# Patient Record
Sex: Male | Born: 1960 | Race: Black or African American | Hispanic: No | Marital: Single
Health system: Southern US, Community
[De-identification: ages and names within clinical notes are randomized; demographics above are authoritative.]

## PROBLEM LIST (undated history)

## (undated) DIAGNOSIS — F32A Depression, unspecified: Secondary | ICD-10-CM

## (undated) DIAGNOSIS — N183 Chronic kidney disease, stage 3 unspecified: Secondary | ICD-10-CM

## (undated) DIAGNOSIS — K921 Melena: Secondary | ICD-10-CM

## (undated) DIAGNOSIS — F1490 Cocaine use, unspecified, uncomplicated: Secondary | ICD-10-CM

## (undated) DIAGNOSIS — I5032 Chronic diastolic (congestive) heart failure: Secondary | ICD-10-CM

## (undated) DIAGNOSIS — Z720 Tobacco use: Secondary | ICD-10-CM

## (undated) DIAGNOSIS — F1190 Opioid use, unspecified, uncomplicated: Secondary | ICD-10-CM

## (undated) DIAGNOSIS — I10 Essential (primary) hypertension: Secondary | ICD-10-CM

## (undated) DIAGNOSIS — I5042 Chronic combined systolic (congestive) and diastolic (congestive) heart failure: Secondary | ICD-10-CM

## (undated) DIAGNOSIS — Z87828 Personal history of other (healed) physical injury and trauma: Secondary | ICD-10-CM

## (undated) DIAGNOSIS — I509 Heart failure, unspecified: Secondary | ICD-10-CM

## (undated) DIAGNOSIS — K219 Gastro-esophageal reflux disease without esophagitis: Secondary | ICD-10-CM

## (undated) DIAGNOSIS — I4891 Unspecified atrial fibrillation: Secondary | ICD-10-CM

## (undated) DIAGNOSIS — I4892 Unspecified atrial flutter: Secondary | ICD-10-CM

## (undated) DIAGNOSIS — Z9119 Patient's noncompliance with other medical treatment and regimen: Secondary | ICD-10-CM

## (undated) DIAGNOSIS — E119 Type 2 diabetes mellitus without complications: Secondary | ICD-10-CM

## (undated) DIAGNOSIS — E7800 Pure hypercholesterolemia, unspecified: Secondary | ICD-10-CM

## (undated) DIAGNOSIS — K7460 Unspecified cirrhosis of liver: Secondary | ICD-10-CM

## (undated) DIAGNOSIS — F1911 Other psychoactive substance abuse, in remission: Secondary | ICD-10-CM

## (undated) DIAGNOSIS — G8929 Other chronic pain: Secondary | ICD-10-CM

## (undated) DIAGNOSIS — R079 Chest pain, unspecified: Secondary | ICD-10-CM

## (undated) DIAGNOSIS — Z91199 Patient's noncompliance with other medical treatment and regimen due to unspecified reason: Secondary | ICD-10-CM

## (undated) DIAGNOSIS — F329 Major depressive disorder, single episode, unspecified: Secondary | ICD-10-CM

## (undated) DIAGNOSIS — B1920 Unspecified viral hepatitis C without hepatic coma: Secondary | ICD-10-CM

## (undated) DIAGNOSIS — C801 Malignant (primary) neoplasm, unspecified: Secondary | ICD-10-CM

## (undated) DIAGNOSIS — M1990 Unspecified osteoarthritis, unspecified site: Secondary | ICD-10-CM

## (undated) DIAGNOSIS — I428 Other cardiomyopathies: Secondary | ICD-10-CM

## (undated) DIAGNOSIS — F1010 Alcohol abuse, uncomplicated: Secondary | ICD-10-CM

## (undated) DIAGNOSIS — G629 Polyneuropathy, unspecified: Secondary | ICD-10-CM

## (undated) HISTORY — DX: Gastro-esophageal reflux disease without esophagitis: K21.9

## (undated) HISTORY — PX: FRACTURE SURGERY: SHX138

## (undated) HISTORY — PX: KNEE ARTHROPLASTY: SHX992

## (undated) HISTORY — DX: Other psychoactive substance abuse, in remission: F19.11

## (undated) HISTORY — DX: Personal history of other (healed) physical injury and trauma: Z87.828

## (undated) HISTORY — DX: Tobacco use: Z72.0

## (undated) HISTORY — PX: THORACOTOMY: SUR1349

---

## 2002-05-12 ENCOUNTER — Emergency Department (HOSPITAL_COMMUNITY): Admission: EM | Admit: 2002-05-12 | Discharge: 2002-05-12 | Payer: Self-pay

## 2004-02-07 ENCOUNTER — Emergency Department (HOSPITAL_COMMUNITY): Admission: EM | Admit: 2004-02-07 | Discharge: 2004-02-07 | Payer: Self-pay | Admitting: Family Medicine

## 2004-04-15 ENCOUNTER — Emergency Department (HOSPITAL_COMMUNITY): Admission: EM | Admit: 2004-04-15 | Discharge: 2004-04-15 | Payer: Self-pay | Admitting: Family Medicine

## 2010-01-04 ENCOUNTER — Emergency Department (HOSPITAL_COMMUNITY): Admission: EM | Admit: 2010-01-04 | Discharge: 2010-01-04 | Payer: Self-pay | Admitting: Emergency Medicine

## 2010-06-15 ENCOUNTER — Inpatient Hospital Stay (HOSPITAL_COMMUNITY): Admission: EM | Admit: 2010-06-15 | Discharge: 2010-06-16 | Payer: Self-pay | Admitting: Emergency Medicine

## 2010-06-24 ENCOUNTER — Inpatient Hospital Stay (HOSPITAL_COMMUNITY): Admission: EM | Admit: 2010-06-24 | Discharge: 2010-06-26 | Payer: Self-pay | Admitting: Emergency Medicine

## 2011-03-09 LAB — RAPID URINE DRUG SCREEN, HOSP PERFORMED
Amphetamines: NOT DETECTED
Amphetamines: NOT DETECTED
Barbiturates: NOT DETECTED
Barbiturates: NOT DETECTED
Benzodiazepines: NOT DETECTED
Benzodiazepines: NOT DETECTED
Cocaine: NOT DETECTED
Cocaine: NOT DETECTED
Opiates: POSITIVE — AB
Opiates: POSITIVE — AB
Tetrahydrocannabinol: NOT DETECTED
Tetrahydrocannabinol: NOT DETECTED

## 2011-03-09 LAB — URINALYSIS, ROUTINE W REFLEX MICROSCOPIC
Bilirubin Urine: NEGATIVE
Bilirubin Urine: NEGATIVE
Glucose, UA: 100 mg/dL — AB
Glucose, UA: NEGATIVE mg/dL
Hgb urine dipstick: NEGATIVE
Hgb urine dipstick: NEGATIVE
Ketones, ur: NEGATIVE mg/dL
Ketones, ur: NEGATIVE mg/dL
Leukocytes, UA: NEGATIVE
Leukocytes, UA: NEGATIVE
Nitrite: NEGATIVE
Nitrite: NEGATIVE
Protein, ur: 100 mg/dL — AB
Protein, ur: 30 mg/dL — AB
Specific Gravity, Urine: 1.007 (ref 1.005–1.030)
Specific Gravity, Urine: 1.009 (ref 1.005–1.030)
Urobilinogen, UA: 0.2 mg/dL (ref 0.0–1.0)
Urobilinogen, UA: 0.2 mg/dL (ref 0.0–1.0)
pH: 5 (ref 5.0–8.0)
pH: 5 (ref 5.0–8.0)

## 2011-03-09 LAB — COMPREHENSIVE METABOLIC PANEL
ALT: 26 U/L (ref 0–53)
ALT: 30 U/L (ref 0–53)
AST: 31 U/L (ref 0–37)
AST: 51 U/L — ABNORMAL HIGH (ref 0–37)
Albumin: 3 g/dL — ABNORMAL LOW (ref 3.5–5.2)
Albumin: 3.5 g/dL (ref 3.5–5.2)
Alkaline Phosphatase: 74 U/L (ref 39–117)
Alkaline Phosphatase: 75 U/L (ref 39–117)
BUN: 5 mg/dL — ABNORMAL LOW (ref 6–23)
BUN: 8 mg/dL (ref 6–23)
CO2: 23 mEq/L (ref 19–32)
CO2: 28 mEq/L (ref 19–32)
Calcium: 8.3 mg/dL — ABNORMAL LOW (ref 8.4–10.5)
Calcium: 8.9 mg/dL (ref 8.4–10.5)
Chloride: 104 mEq/L (ref 96–112)
Chloride: 105 mEq/L (ref 96–112)
Creatinine, Ser: 1.05 mg/dL (ref 0.4–1.5)
Creatinine, Ser: 1.1 mg/dL (ref 0.4–1.5)
GFR calc Af Amer: >60 mL/min (ref >60)
GFR calc Af Amer: >60 mL/min (ref >60)
GFR calc non Af Amer: >60 mL/min (ref >60)
GFR calc non Af Amer: >60 mL/min (ref >60)
Glucose, Bld: 118 mg/dL — ABNORMAL HIGH (ref 70–99)
Glucose, Bld: 230 mg/dL — ABNORMAL HIGH (ref 70–99)
Potassium: 3.6 mEq/L (ref 3.5–5.1)
Potassium: 4.3 mEq/L (ref 3.5–5.1)
Sodium: 138 mEq/L (ref 135–145)
Sodium: 139 mEq/L (ref 135–145)
Total Bilirubin: 0.8 mg/dL (ref 0.3–1.2)
Total Bilirubin: 1.6 mg/dL — ABNORMAL HIGH (ref 0.3–1.2)
Total Protein: 6.4 g/dL (ref 6.0–8.3)
Total Protein: 7.8 g/dL (ref 6.0–8.3)

## 2011-03-09 LAB — DIFFERENTIAL
Basophils Absolute: 0 10*3/uL (ref 0.0–0.1)
Basophils Absolute: 0 10*3/uL (ref 0.0–0.1)
Basophils Absolute: 0.1 10*3/uL (ref 0.0–0.1)
Basophils Relative: 1 % (ref 0–1)
Basophils Relative: 1 % (ref 0–1)
Basophils Relative: 1 % (ref 0–1)
Eosinophils Absolute: 0 10*3/uL (ref 0.0–0.7)
Eosinophils Absolute: 0.1 10*3/uL (ref 0.0–0.7)
Eosinophils Absolute: 0.1 10*3/uL (ref 0.0–0.7)
Eosinophils Relative: 0 % (ref 0–5)
Eosinophils Relative: 1 % (ref 0–5)
Eosinophils Relative: 1 % (ref 0–5)
Lymphocytes Relative: 18 % (ref 12–46)
Lymphocytes Relative: 22 % (ref 12–46)
Lymphocytes Relative: 27 % (ref 12–46)
Lymphs Abs: 0.9 10*3/uL (ref 0.7–4.0)
Lymphs Abs: 1 10*3/uL (ref 0.7–4.0)
Lymphs Abs: 2 10*3/uL (ref 0.7–4.0)
Monocytes Absolute: 0.3 10*3/uL (ref 0.1–1.0)
Monocytes Absolute: 0.4 10*3/uL (ref 0.1–1.0)
Monocytes Absolute: 0.6 10*3/uL (ref 0.1–1.0)
Monocytes Relative: 7 % (ref 3–12)
Monocytes Relative: 8 % (ref 3–12)
Monocytes Relative: 8 % (ref 3–12)
Neutro Abs: 3 10*3/uL (ref 1.7–7.7)
Neutro Abs: 4.1 10*3/uL (ref 1.7–7.7)
Neutro Abs: 4.7 10*3/uL (ref 1.7–7.7)
Neutrophils Relative %: 63 % (ref 43–77)
Neutrophils Relative %: 70 % (ref 43–77)
Neutrophils Relative %: 73 % (ref 43–77)

## 2011-03-09 LAB — BASIC METABOLIC PANEL
BUN: 12 mg/dL (ref 6–23)
BUN: 15 mg/dL (ref 6–23)
BUN: 6 mg/dL (ref 6–23)
CO2: 16 mEq/L — ABNORMAL LOW (ref 19–32)
CO2: 29 mEq/L (ref 19–32)
CO2: 33 mEq/L — ABNORMAL HIGH (ref 19–32)
Calcium: 8.1 mg/dL — ABNORMAL LOW (ref 8.4–10.5)
Calcium: 8.6 mg/dL (ref 8.4–10.5)
Calcium: 9 mg/dL (ref 8.4–10.5)
Chloride: 104 mEq/L (ref 96–112)
Chloride: 91 mEq/L — ABNORMAL LOW (ref 96–112)
Chloride: 97 mEq/L (ref 96–112)
Creatinine, Ser: 1.08 mg/dL (ref 0.4–1.5)
Creatinine, Ser: 1.29 mg/dL (ref 0.4–1.5)
Creatinine, Ser: 1.42 mg/dL (ref 0.4–1.5)
GFR calc Af Amer: >60 mL/min (ref >60)
GFR calc Af Amer: >60 mL/min (ref >60)
GFR calc Af Amer: >60 mL/min (ref >60)
GFR calc non Af Amer: 53 mL/min — ABNORMAL LOW (ref >60)
GFR calc non Af Amer: 59 mL/min — ABNORMAL LOW (ref >60)
GFR calc non Af Amer: >60 mL/min (ref >60)
Glucose, Bld: 115 mg/dL — ABNORMAL HIGH (ref 70–99)
Glucose, Bld: 124 mg/dL — ABNORMAL HIGH (ref 70–99)
Glucose, Bld: 129 mg/dL — ABNORMAL HIGH (ref 70–99)
Potassium: 3.3 mEq/L — ABNORMAL LOW (ref 3.5–5.1)
Potassium: 3.4 mEq/L — ABNORMAL LOW (ref 3.5–5.1)
Potassium: 3.5 mEq/L (ref 3.5–5.1)
Sodium: 134 mEq/L — ABNORMAL LOW (ref 135–145)
Sodium: 136 mEq/L (ref 135–145)
Sodium: 136 mEq/L (ref 135–145)

## 2011-03-09 LAB — CBC
HCT: 37.7 % — ABNORMAL LOW (ref 39.0–52.0)
HCT: 40.6 % (ref 39.0–52.0)
HCT: 46.3 % (ref 39.0–52.0)
Hemoglobin: 13.2 g/dL (ref 13.0–17.0)
Hemoglobin: 14.1 g/dL (ref 13.0–17.0)
Hemoglobin: 15.9 g/dL (ref 13.0–17.0)
MCH: 34.4 pg — ABNORMAL HIGH (ref 26.0–34.0)
MCH: 34.4 pg — ABNORMAL HIGH (ref 26.0–34.0)
MCH: 34.7 pg — ABNORMAL HIGH (ref 26.0–34.0)
MCHC: 34.5 g/dL (ref 30.0–36.0)
MCHC: 34.7 g/dL (ref 30.0–36.0)
MCHC: 35 g/dL (ref 30.0–36.0)
MCV: 100 fL (ref 78.0–100.0)
MCV: 98.2 fL (ref 78.0–100.0)
MCV: 99.7 fL (ref 78.0–100.0)
Platelets: 169 10*3/uL (ref 150–400)
Platelets: 180 10*3/uL (ref 150–400)
Platelets: 232 10*3/uL (ref 150–400)
RBC: 3.83 MIL/uL — ABNORMAL LOW (ref 4.22–5.81)
RBC: 4.06 MIL/uL — ABNORMAL LOW (ref 4.22–5.81)
RBC: 4.64 MIL/uL (ref 4.22–5.81)
RDW: 13.9 % (ref 11.5–15.5)
RDW: 14 % (ref 11.5–15.5)
RDW: 14.1 % (ref 11.5–15.5)
WBC: 4.3 10*3/uL (ref 4.0–10.5)
WBC: 5.6 10*3/uL (ref 4.0–10.5)
WBC: 7.4 10*3/uL (ref 4.0–10.5)

## 2011-03-09 LAB — TSH: TSH: 1.319 u[IU]/mL (ref 0.350–4.500)

## 2011-03-09 LAB — POCT CARDIAC MARKERS
CKMB, poc: <1 ng/mL — ABNORMAL LOW (ref 1.0–8.0)
CKMB, poc: <1 ng/mL — ABNORMAL LOW (ref 1.0–8.0)
CKMB, poc: <1 ng/mL — ABNORMAL LOW (ref 1.0–8.0)
Myoglobin, poc: 40.9 ng/mL (ref 12–200)
Myoglobin, poc: 48.5 ng/mL (ref 12–200)
Myoglobin, poc: 62.1 ng/mL (ref 12–200)
Troponin i, poc: 0.05 ng/mL (ref 0.00–0.09)
Troponin i, poc: 0.07 ng/mL (ref 0.00–0.09)
Troponin i, poc: <0.05 ng/mL (ref 0.00–0.09)

## 2011-03-09 LAB — CK TOTAL AND CKMB (NOT AT ARMC)
CK, MB: 0.6 ng/mL (ref 0.3–4.0)
CK, MB: 0.6 ng/mL (ref 0.3–4.0)
CK, MB: 0.7 ng/mL (ref 0.3–4.0)
CK, MB: 0.7 ng/mL (ref 0.3–4.0)
CK, MB: 0.8 ng/mL (ref 0.3–4.0)
CK, MB: 1.5 ng/mL (ref 0.3–4.0)
Relative Index: INVALID (ref 0.0–2.5)
Relative Index: INVALID (ref 0.0–2.5)
Relative Index: INVALID (ref 0.0–2.5)
Relative Index: INVALID (ref 0.0–2.5)
Relative Index: INVALID (ref 0.0–2.5)
Relative Index: INVALID (ref 0.0–2.5)
Total CK: 22 U/L (ref 7–232)
Total CK: 33 U/L (ref 7–232)
Total CK: 37 U/L (ref 7–232)
Total CK: 39 U/L (ref 7–232)
Total CK: 42 U/L (ref 7–232)
Total CK: 7 U/L (ref 7–232)

## 2011-03-09 LAB — POCT I-STAT, CHEM 8
BUN: 11 mg/dL (ref 6–23)
Calcium, Ion: 1.04 mmol/L — ABNORMAL LOW (ref 1.12–1.32)
Chloride: 98 mEq/L (ref 96–112)
Creatinine, Ser: 1.2 mg/dL (ref 0.4–1.5)
Glucose, Bld: 165 mg/dL — ABNORMAL HIGH (ref 70–99)
HCT: 54 % — ABNORMAL HIGH (ref 39.0–52.0)
Hemoglobin: 18.4 g/dL — ABNORMAL HIGH (ref 13.0–17.0)
Potassium: 3.4 mEq/L — ABNORMAL LOW (ref 3.5–5.1)
Sodium: 136 mEq/L (ref 135–145)
TCO2: 32 mmol/L (ref 0–100)

## 2011-03-09 LAB — BLOOD GAS, ARTERIAL
Acid-base deficit: 7.3 mmol/L — ABNORMAL HIGH (ref 0.0–2.0)
Bicarbonate: 18.8 mEq/L — ABNORMAL LOW (ref 20.0–24.0)
Drawn by: 295031
Expiratory PAP: 6
FIO2: 1 %
Inspiratory PAP: 12
Mode: POSITIVE
O2 Saturation: 88.2 %
Patient temperature: 98.6
TCO2: 16.8 mmol/L (ref 0–100)
pCO2 arterial: 41.6 mmHg (ref 35.0–45.0)
pH, Arterial: 7.278 — ABNORMAL LOW (ref 7.350–7.450)
pO2, Arterial: 64.4 mmHg — ABNORMAL LOW (ref 80.0–100.0)

## 2011-03-09 LAB — T3: T3, Total: 66.3 ng/dl — ABNORMAL LOW (ref 80.0–204.0)

## 2011-03-09 LAB — URINE CULTURE
Colony Count: NO GROWTH
Colony Count: NO GROWTH
Culture: NO GROWTH
Culture: NO GROWTH

## 2011-03-09 LAB — URINE MICROSCOPIC-ADD ON

## 2011-03-09 LAB — LIPID PANEL
Cholesterol: 164 mg/dL (ref 0–200)
HDL: 68 mg/dL (ref >39)
LDL Cholesterol: 84 mg/dL (ref 0–99)
Total CHOL/HDL Ratio: 2.4 RATIO
Triglycerides: 59 mg/dL (ref <150)
VLDL: 12 mg/dL (ref 0–40)

## 2011-03-09 LAB — CARDIAC PANEL(CRET KIN+CKTOT+MB+TROPI)
CK, MB: 0.7 ng/mL (ref 0.3–4.0)
CK, MB: 0.7 ng/mL (ref 0.3–4.0)
Relative Index: INVALID (ref 0.0–2.5)
Relative Index: INVALID (ref 0.0–2.5)
Total CK: 42 U/L (ref 7–232)
Total CK: 46 U/L (ref 7–232)
Troponin I: 0.14 ng/mL — ABNORMAL HIGH (ref 0.00–0.06)
Troponin I: 0.15 ng/mL — ABNORMAL HIGH (ref 0.00–0.06)

## 2011-03-09 LAB — GLUCOSE, CAPILLARY
Glucose-Capillary: 115 mg/dL — ABNORMAL HIGH (ref 70–99)
Glucose-Capillary: 121 mg/dL — ABNORMAL HIGH (ref 70–99)
Glucose-Capillary: 122 mg/dL — ABNORMAL HIGH (ref 70–99)
Glucose-Capillary: 122 mg/dL — ABNORMAL HIGH (ref 70–99)
Glucose-Capillary: 131 mg/dL — ABNORMAL HIGH (ref 70–99)
Glucose-Capillary: 132 mg/dL — ABNORMAL HIGH (ref 70–99)
Glucose-Capillary: 140 mg/dL — ABNORMAL HIGH (ref 70–99)
Glucose-Capillary: 145 mg/dL — ABNORMAL HIGH (ref 70–99)
Glucose-Capillary: 153 mg/dL — ABNORMAL HIGH (ref 70–99)
Glucose-Capillary: 153 mg/dL — ABNORMAL HIGH (ref 70–99)
Glucose-Capillary: 197 mg/dL — ABNORMAL HIGH (ref 70–99)
Glucose-Capillary: 199 mg/dL — ABNORMAL HIGH (ref 70–99)

## 2011-03-09 LAB — BLOOD GAS, VENOUS
Acid-base deficit: 6 mmol/L — ABNORMAL HIGH (ref 0.0–2.0)
Bicarbonate: 16.7 mEq/L — ABNORMAL LOW (ref 20.0–24.0)
Drawn by: 27343
O2 Saturation: 81.9 %
Patient temperature: 98.6
TCO2: 14.8 mmol/L (ref 0–100)
pCO2, Ven: 26.7 mmHg — ABNORMAL LOW (ref 45.0–50.0)
pH, Ven: 7.413 — ABNORMAL HIGH (ref 7.250–7.300)
pO2, Ven: 45.7 mmHg — ABNORMAL HIGH (ref 30.0–45.0)

## 2011-03-09 LAB — MRSA PCR SCREENING
MRSA by PCR: NEGATIVE
MRSA by PCR: NEGATIVE

## 2011-03-09 LAB — TROPONIN I
Troponin I: 0.09 ng/mL — ABNORMAL HIGH (ref 0.00–0.06)
Troponin I: 0.1 ng/mL — ABNORMAL HIGH (ref 0.00–0.06)
Troponin I: 0.11 ng/mL — ABNORMAL HIGH (ref 0.00–0.06)
Troponin I: 0.13 ng/mL — ABNORMAL HIGH (ref 0.00–0.06)
Troponin I: 0.13 ng/mL — ABNORMAL HIGH (ref 0.00–0.06)
Troponin I: 0.14 ng/mL — ABNORMAL HIGH (ref 0.00–0.06)

## 2011-03-09 LAB — PROTIME-INR
INR: 1.09 (ref 0.00–1.49)
Prothrombin Time: 14 seconds (ref 11.6–15.2)

## 2011-03-09 LAB — APTT: aPTT: 26 seconds (ref 24–37)

## 2011-03-09 LAB — BRAIN NATRIURETIC PEPTIDE
Pro B Natriuretic peptide (BNP): 1000 pg/mL — ABNORMAL HIGH (ref 0.0–100.0)
Pro B Natriuretic peptide (BNP): 1190 pg/mL — ABNORMAL HIGH (ref 0.0–100.0)
Pro B Natriuretic peptide (BNP): 1520 pg/mL — ABNORMAL HIGH (ref 0.0–100.0)
Pro B Natriuretic peptide (BNP): >3200 pg/mL — ABNORMAL HIGH (ref 0.0–100.0)

## 2011-03-09 LAB — HEMOGLOBIN A1C
Hgb A1c MFr Bld: 5.9 % — ABNORMAL HIGH (ref <5.7)
Mean Plasma Glucose: 123 mg/dL — ABNORMAL HIGH (ref <117)

## 2011-03-09 LAB — D-DIMER, QUANTITATIVE: D-Dimer, Quant: 1.25 ug/mL-FEU — ABNORMAL HIGH (ref 0.00–0.48)

## 2011-03-09 LAB — T4: T4, Total: 6.1 ug/dL (ref 5.0–12.5)

## 2011-03-09 LAB — ETHANOL: Alcohol, Ethyl (B): 12 mg/dL — ABNORMAL HIGH (ref 0–10)

## 2011-03-09 LAB — MAGNESIUM: Magnesium: 1.5 mg/dL (ref 1.5–2.5)

## 2011-05-09 NOTE — H&P (Signed)
Brad [name]. [facility]  Patient:    Brad [name], Brad [name] Visit Number: [id number] MRN: [id number]          Service Type: EMS Location: [place] Attending Physician:  Brad [name] Dictated by:   Brad [name], N.P. Admit Date:  05/12/2002   CC:         Brad Balm. Georgina Pillion, M.D.   History and Physical  DATE OF BIRTH:  2061-06-11  IMPRESSION (As dictated by Brad [name], M.D.): 1. Two episodes of transient chest tightness, both brought on by exertion    without associated symptoms in this 50 year old gentleman with a history of    significant for positive tobacco use, episodic cocaine use, and uncertain    cholesterol profile.  The patient has noted that he can be active and exert    himself without precipitating the discomfort.  His history is significant    for what sounds like prior myocardial perfusion scan and 2-D echocardiogram    done approximately five years earlier.  The patient was told that he had    thinning of the front part of his heart and was treated with digoxin.  This    raises the question that he may have been diagnosed with a cocaine-induced    cardiomyopathy as he was a more habitual user at that time.  His history is    somewhat vague.  The patients EKG here in the emergency room reveals    changes consistent with old anteroseptal MI.  J-point elevation with    upsloping ST segment consistent with early repolarization.  His first CK    and MB are negative and the troponin I is just above the indeterminate    range at 0.07.  He is currently pain-free. 2. Moderate ethanol use with drinking two to three beers per day. 3. Tobacco abuse of one-and-a-half packs for the past 21 years. 4. History of hypertension.  Episodic noncompliance with medical regimen. 5. Hypokalemia secondary to diuretics with a potassium of 3.3.  He was    supplemented with 40 mEq of potassium today.  PLAN (As dictated by Brad [name], M.D.): 1. The patient will be  discharged home. 2. He is instructed to take aspirin 325 mg once daily. 3. Prescription given for sublingual nitrate with written and verbal    instructions on how to take this medication. 4. Follow-up as previously scheduled by Brad [name], M.D., at 9:30 a.m. on  May 13, 2002.  HISTORY OF PRESENT ILLNESS:  [name] is a very pleasant 50 year old gentleman with a history of intermittent cocaine use, positive tobacco use, and prior cardiac work-up approximately five years ago consisting of what sounds like a myocardial perfusion scan and 2-D echocardiogram, which per patient report revealed "thinning of the front wall of the heart."  He was treated with digoxin.  By his recounting of events, it seems that he may have had a type of cardiomyopathy, possibly cocaine induced.  Five days prior to admission, the patient ran out of his medication (Zestoretic and Norvasc 5 mg).  Two days ago, he felt anterior chest pain when working.  This lasted a few minutes without associated symptoms.  He reported to his primary care Brad [name] yesterday.  He was given Norvasc samples and prescribed genetic Zestoretic.  He received 20 mg of Norvasc yesterday.  He was to take 10 mg per day thereafter.  His blood pressure was 165/120 in the doctors office yesterday.  An appointment was made for him  to follow up and establish with Brad [name], M.D., of cardiology on May 13, 2002, at 9:30 a.m.  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  He then reported to the emergency room as he had been instructed to do by his primary care Brad [name]. Here his EKG was consistent with "old septal MI" and J-point elevation with upsloping ST segment consistent with early repolarization changes.  He continues pain-free.  PAST MEDICAL HISTORY:  As above.  PAST SURGICAL HISTORY:  Negative.  SOCIAL HISTORY AND HABITS:  The patient has been married for  one-and-a-half years.  He works as a Investment banker, operational.  ETOH:  Two to three beers per day.  Tobacco: One-half pack per day for 21 years.  The patient has been married for one-and-a-half years.  He has a son age 48-1/2 years and another son age 34 from a prior marriage.  Cocaine:  Used about two to three months and at Christmas prior to that.  ALLERGIES:  No known drug allergies.  MEDICATIONS: 1. Zestoretic, generic, uncertain dose, p.o. q.d. 2. Norvasc 10 mg p.o. q.d.  FAMILY HISTORY:  Mother, age 34, status post breast cancer.  His fathers age and whereabouts are unknown.  Two sisters alive and well.  One brother died of drowning at age 73.  REVIEW OF SYSTEMS:  As in the HPI/previous medical history.  Otherwise denies problems with lightheadedness, dizziness, syncope, or near syncopal episodes. Denies dysphagia to food or fluid.  Episodic GERD which improved quite nicely on Nexium in the past.  Negative constipation, diarrhea, melena, nor bright red blood per rectum.  Denies pedal edema, orthopnea, or PND.  Negative DOE.  PHYSICAL EXAMINATION:  The blood pressure is 143/92, heart rate initially 121 and currently 79, respiratory rate 16, and O2 saturation 98%.  GENERAL APPEARANCE:  He is a well-nourished, middle-aged gentleman in no apparent distress.  His wife was in attendance.  NECK:  Brisk bilateral carotid upstroke without bruit.  No significant JVD or thyromegaly.  CHEST:  Lung sounds clear with negative CVA tenderness.  CARDIAC:  Regular rate and rhythm without murmurs, rubs, or gallops.  Normal S1 and S2.  ABDOMEN:  Soft, nondistended.  Normoactive bowel sounds.  Negative abdominal aorta, renal, or femoral bruits.  Nontender to applied pressure.  No masses. No organomegaly appreciated.  EXTREMITIES:  Distal pulses intact.  Negative pedal edema.  NEUROLOGIC:  Cranial nerves II-XII grossly intact.  Alert and oriented x 3.  GENITALIA:  Deferred.  RECTAL:   Deferred.  LABORATORY TESTS AND DATA:  Sodium 137, K 3.5, chloride 102, CO2 26, BUN 16,  creatinine 1.2, glucose 111.  LFTs within normal range.  Hemoglobin 15.1, hematocrit 42.4, WBC 6.6, platelets 208.  Troponin I 0.07, CK 130, MB fraction 0.3.  The EKG revealed NSR at 79 beats per minute with "old" anteroseptal MI and changes consistent with early repolarization. Dictated by:   Brad [name], N.P. Attending Physician:  Brad [name] DD:  05/12/02 TD:  05/13/02 Job: [id number]

## 2011-05-14 ENCOUNTER — Inpatient Hospital Stay (INDEPENDENT_AMBULATORY_CARE_PROVIDER_SITE_OTHER)
Admission: RE | Admit: 2011-05-14 | Discharge: 2011-05-14 | Disposition: A | Discharge status: Home / Self Care | Payer: Self-pay | Source: Ambulatory Visit | Attending: Family Medicine | Admitting: Family Medicine

## 2011-05-14 DIAGNOSIS — T148XXA Other injury of unspecified body region, initial encounter: Secondary | ICD-10-CM

## 2011-06-18 IMAGING — CR DG CHEST 1V PORT
1 series · 1 of 1 positions shown · non-contrast
Comparison: 01/04/2010.

CLINICAL DATA: Shortness of breath.  Hypertension.  Lower extremity
swelling.

PORTABLE CHEST - 1 VIEW

[series 1]
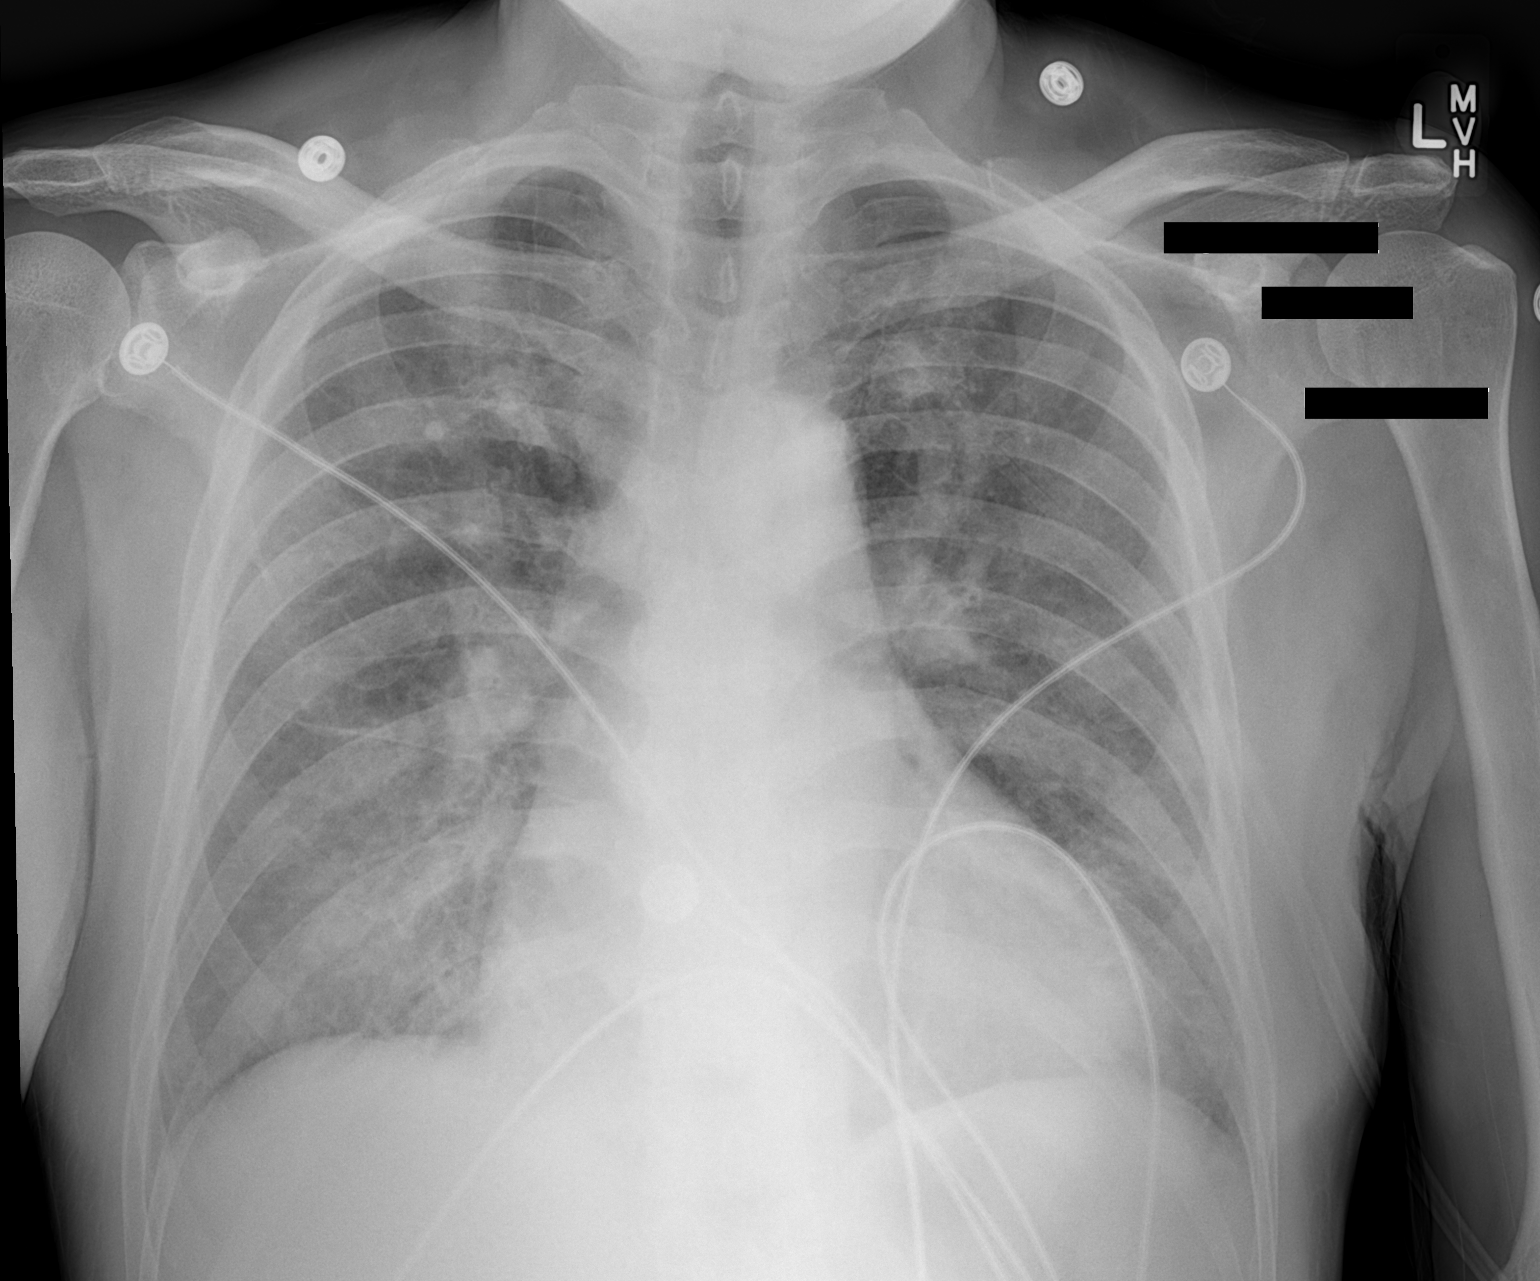

[1 of 1 positions shown; findings below may reference images not displayed]

FINDINGS: The cardiopericardial silhouette is enlarged.  A diffuse
interstitial pattern is now present.  Ill-defined airspace disease
is present at the lung bases, worse on the right.  The visualized
soft tissues and bony thorax are unremarkable.
IMPRESSION: 1.  Cardiomegaly and and a diffuse interstitial pattern compatible
with edema is suggestive of early congestive heart failure.
2.  Bilateral airspace disease likely reflects atelectasis, worse
on the right.

## 2011-06-27 IMAGING — CR DG CHEST 1V PORT
1 series · 2 of 2 positions shown · non-contrast
Comparison: 06/15/2010

CLINICAL DATA: Shortness of breath, chest pain

PORTABLE CHEST - 1 VIEW

[Series 1: series (date) · U · 2 of 2 slices shown]
[im 1/2]
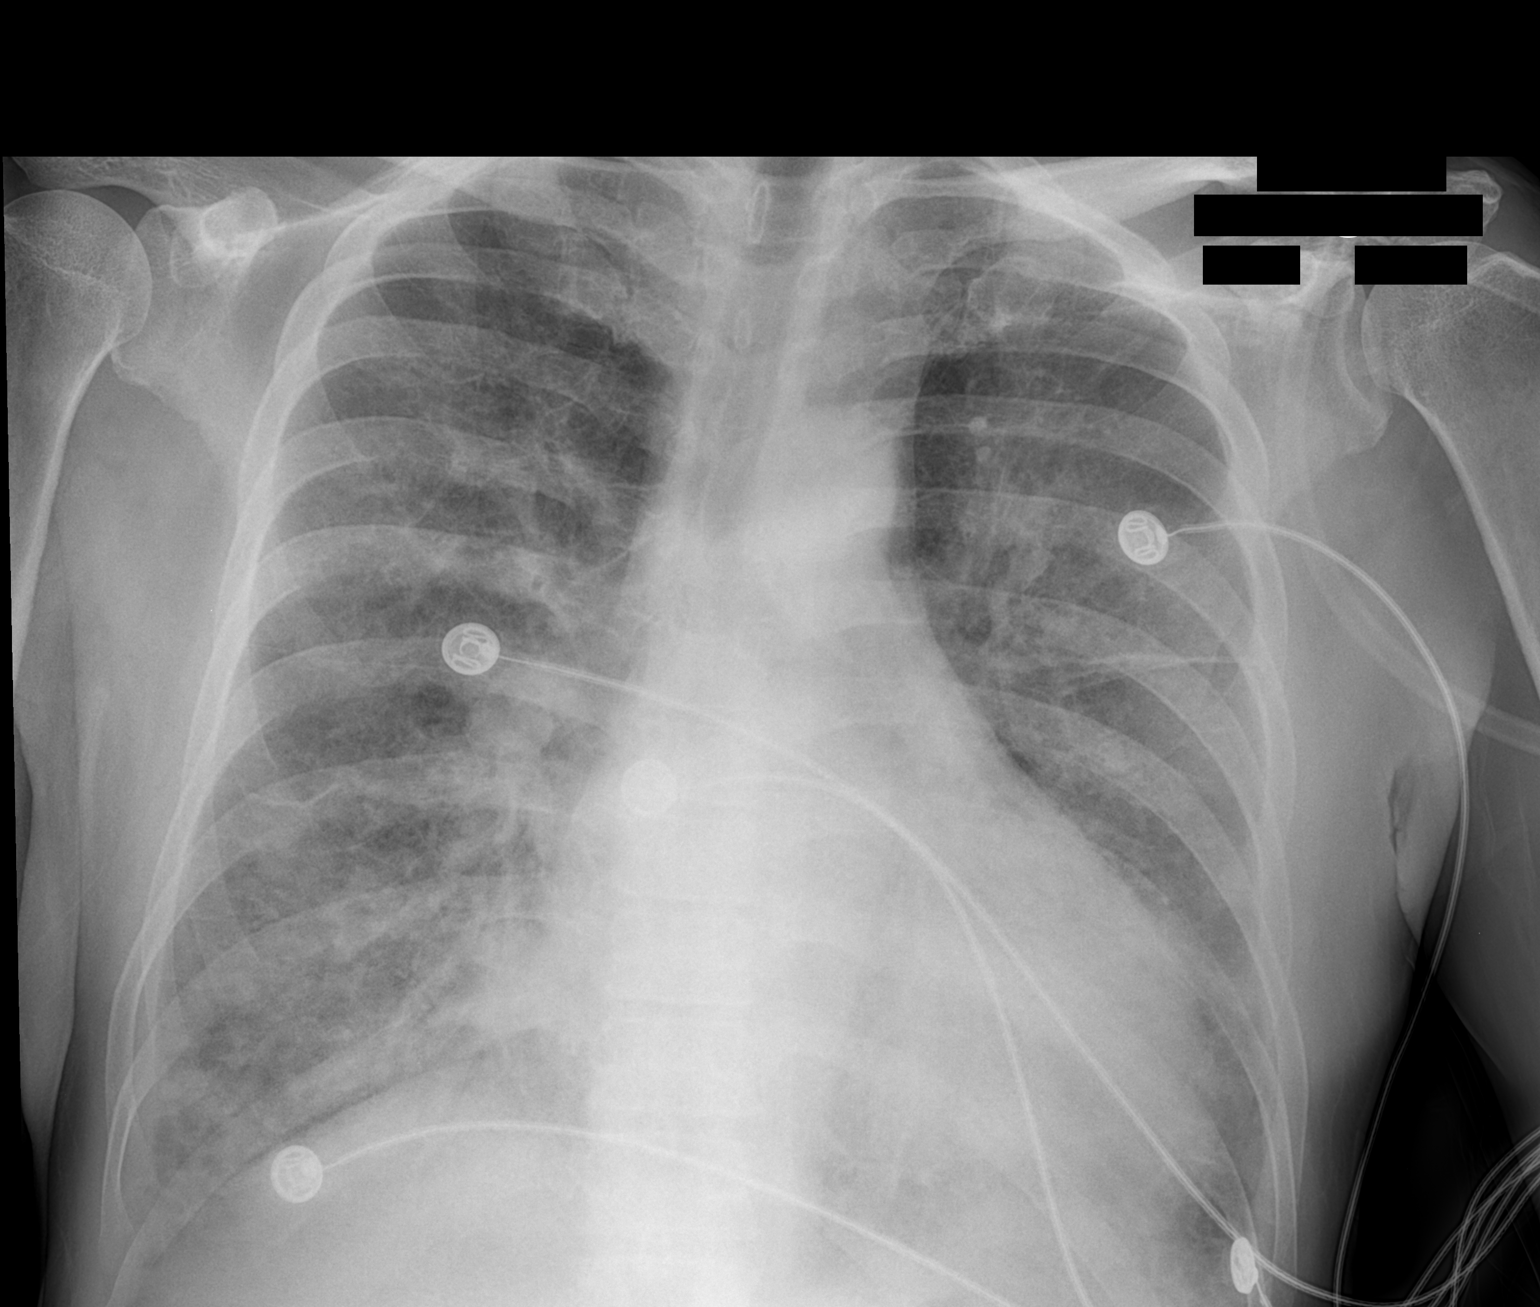
[im 2/2]
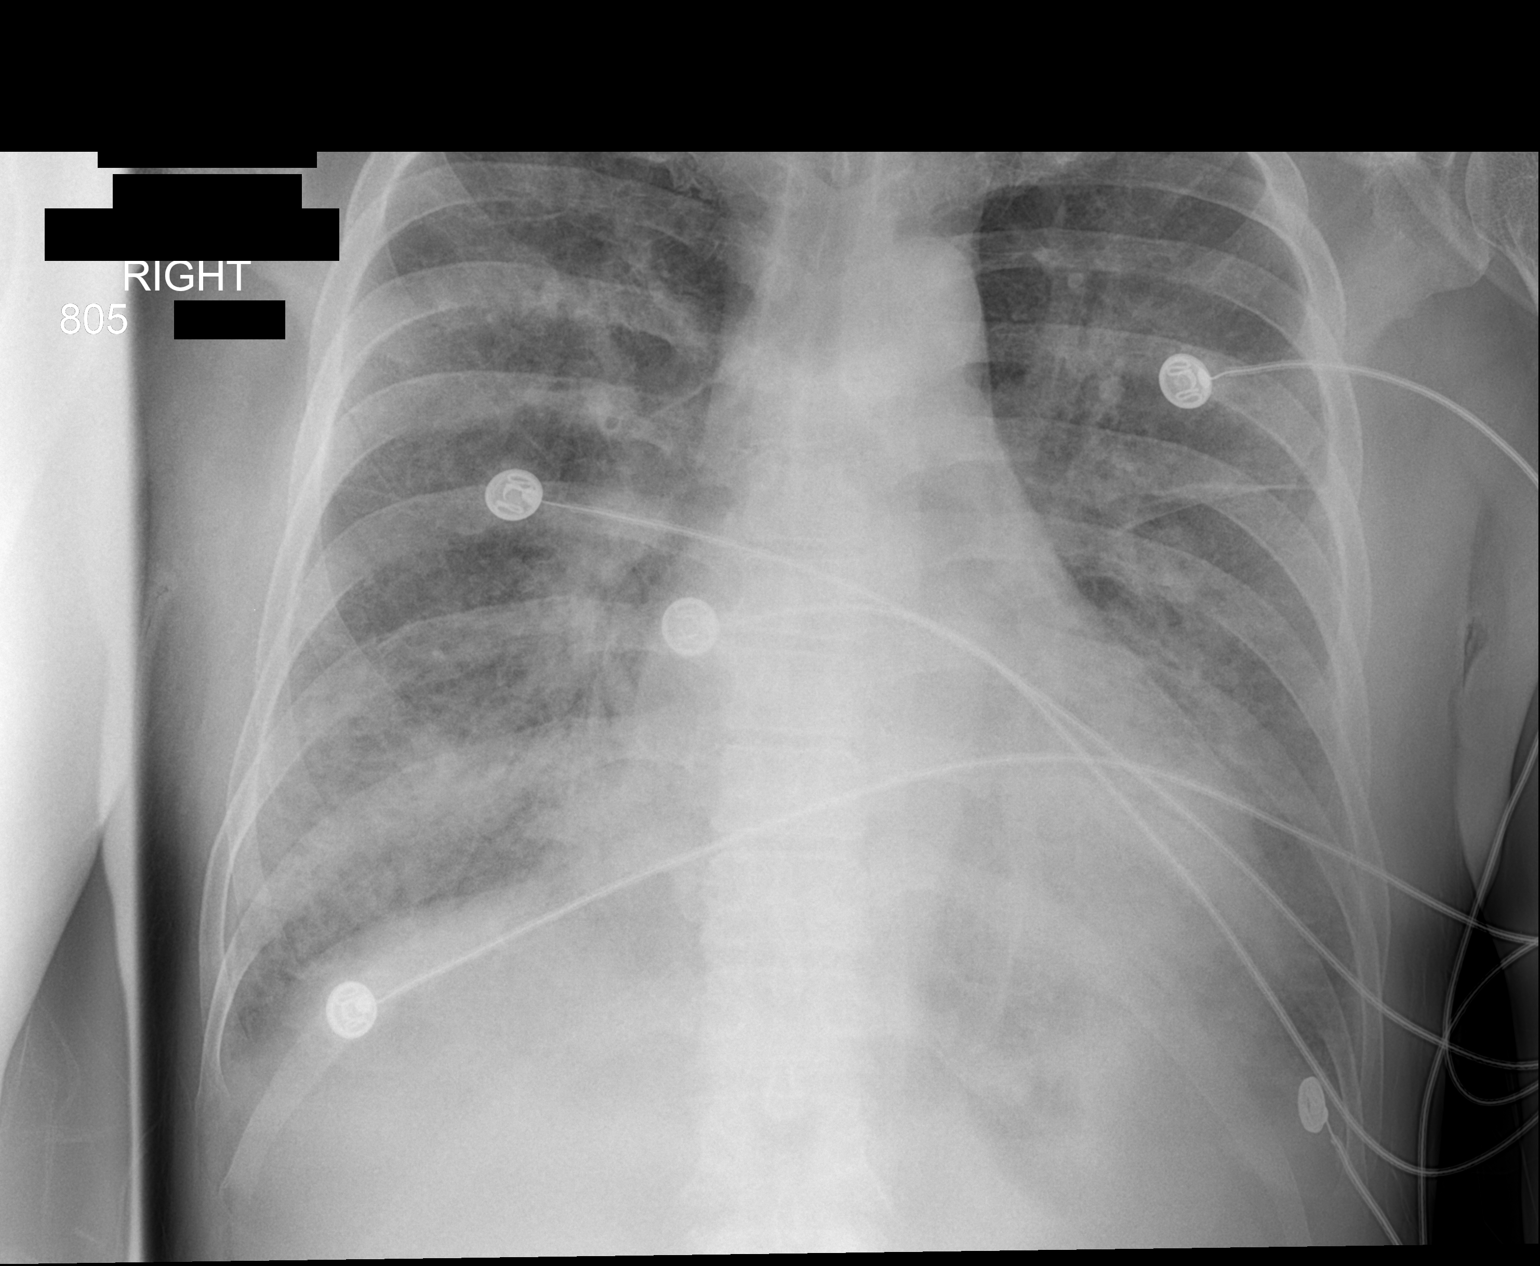

[2 of 2 positions shown; findings below may reference images not displayed]

FINDINGS: Some increase in diffuse interstitial airspace edema or
infiltrates involving bases more than apices.  Heart size upper
limits normal.  No definite effusion.  Regional bones unremarkable.
IMPRESSION: 1.  Interval increase in bilateral edema or infiltrates.

## 2011-11-15 ENCOUNTER — Emergency Department (HOSPITAL_COMMUNITY): Payer: Self-pay

## 2011-11-15 ENCOUNTER — Other Ambulatory Visit: Payer: Self-pay

## 2011-11-15 ENCOUNTER — Emergency Department (HOSPITAL_COMMUNITY)
Admission: EM | Admit: 2011-11-15 | Discharge: 2011-11-15 | Disposition: A | Discharge status: Home / Self Care | Payer: Self-pay | Attending: Emergency Medicine | Admitting: Emergency Medicine

## 2011-11-15 DIAGNOSIS — E1142 Type 2 diabetes mellitus with diabetic polyneuropathy: Secondary | ICD-10-CM | POA: Insufficient documentation

## 2011-11-15 DIAGNOSIS — I10 Essential (primary) hypertension: Secondary | ICD-10-CM | POA: Insufficient documentation

## 2011-11-15 DIAGNOSIS — R209 Unspecified disturbances of skin sensation: Secondary | ICD-10-CM | POA: Insufficient documentation

## 2011-11-15 DIAGNOSIS — Z79899 Other long term (current) drug therapy: Secondary | ICD-10-CM | POA: Insufficient documentation

## 2011-11-15 DIAGNOSIS — G629 Polyneuropathy, unspecified: Secondary | ICD-10-CM

## 2011-11-15 DIAGNOSIS — R292 Abnormal reflex: Secondary | ICD-10-CM | POA: Insufficient documentation

## 2011-11-15 DIAGNOSIS — R5381 Other malaise: Secondary | ICD-10-CM | POA: Insufficient documentation

## 2011-11-15 DIAGNOSIS — R5383 Other fatigue: Secondary | ICD-10-CM | POA: Insufficient documentation

## 2011-11-15 DIAGNOSIS — E1149 Type 2 diabetes mellitus with other diabetic neurological complication: Secondary | ICD-10-CM | POA: Insufficient documentation

## 2011-11-15 HISTORY — DX: Essential (primary) hypertension: I10

## 2011-11-15 LAB — DIFFERENTIAL
Basophils Absolute: 0 10*3/uL (ref 0.0–0.1)
Basophils Relative: 1 % (ref 0–1)
Eosinophils Absolute: 0.1 10*3/uL (ref 0.0–0.7)
Eosinophils Relative: 2 % (ref 0–5)
Lymphocytes Relative: 34 % (ref 12–46)
Lymphs Abs: 1.6 10*3/uL (ref 0.7–4.0)
Monocytes Absolute: 0.6 10*3/uL (ref 0.1–1.0)
Monocytes Relative: 13 % — ABNORMAL HIGH (ref 3–12)
Neutro Abs: 2.3 10*3/uL (ref 1.7–7.7)
Neutrophils Relative %: 50 % (ref 43–77)

## 2011-11-15 LAB — POCT I-STAT TROPONIN I: Troponin i, poc: 0.02 ng/mL (ref 0.00–0.08)

## 2011-11-15 LAB — COMPREHENSIVE METABOLIC PANEL
ALT: 89 U/L — ABNORMAL HIGH (ref 0–53)
AST: 78 U/L — ABNORMAL HIGH (ref 0–37)
Albumin: 2.8 g/dL — ABNORMAL LOW (ref 3.5–5.2)
Alkaline Phosphatase: 70 U/L (ref 39–117)
BUN: 11 mg/dL (ref 6–23)
CO2: 26 mEq/L (ref 19–32)
Calcium: 9.2 mg/dL (ref 8.4–10.5)
Chloride: 100 mEq/L (ref 96–112)
Creatinine, Ser: 0.88 mg/dL (ref 0.50–1.35)
GFR calc Af Amer: >90 mL/min (ref >90)
GFR calc non Af Amer: >90 mL/min (ref >90)
Glucose, Bld: 220 mg/dL — ABNORMAL HIGH (ref 70–99)
Potassium: 3.1 mEq/L — ABNORMAL LOW (ref 3.5–5.1)
Sodium: 137 mEq/L (ref 135–145)
Total Bilirubin: 0.6 mg/dL (ref 0.3–1.2)
Total Protein: 8.2 g/dL (ref 6.0–8.3)

## 2011-11-15 LAB — CBC
HCT: 39.4 % (ref 39.0–52.0)
Hemoglobin: 13.6 g/dL (ref 13.0–17.0)
MCH: 32.2 pg (ref 26.0–34.0)
MCHC: 34.5 g/dL (ref 30.0–36.0)
MCV: 93.1 fL (ref 78.0–100.0)
Platelets: 128 10*3/uL — ABNORMAL LOW (ref 150–400)
RBC: 4.23 MIL/uL (ref 4.22–5.81)
RDW: 12.1 % (ref 11.5–15.5)
WBC: 4.6 10*3/uL (ref 4.0–10.5)

## 2011-11-15 LAB — GLUCOSE, CAPILLARY: Glucose-Capillary: 246 mg/dL — ABNORMAL HIGH (ref 70–99)

## 2011-11-15 LAB — CK TOTAL AND CKMB (NOT AT ARMC)
CK, MB: 2.2 ng/mL (ref 0.3–4.0)
Relative Index: INVALID (ref 0.0–2.5)
Total CK: 91 U/L (ref 7–232)

## 2011-11-15 MED ORDER — SODIUM CHLORIDE 0.9 % IV SOLN
Freq: Once | INTRAVENOUS | Status: AC
  Administered 2011-11-15: 15:00:00 via INTRAVENOUS

## 2011-11-15 MED ORDER — GADOBENATE DIMEGLUMINE 529 MG/ML IV SOLN
20.0000 mL | Freq: Once | INTRAVENOUS | Status: AC | PRN
  Administered 2011-11-15: 20 mL via INTRAVENOUS

## 2011-11-15 MED ORDER — AMLODIPINE BESYLATE 2.5 MG PO TABS: 2.5000 mg | ORAL_TABLET | Freq: Every day | ORAL | Status: DC

## 2011-11-15 MED ORDER — HYDROMORPHONE HCL PF 1 MG/ML IJ SOLN
1.0000 mg | Freq: Once | INTRAMUSCULAR | Status: AC
  Administered 2011-11-15: 1 mg via INTRAVENOUS
  Filled 2011-11-15: qty 1

## 2011-11-15 MED ORDER — FUROSEMIDE 80 MG PO TABS: 80.0000 mg | ORAL_TABLET | Freq: Every day | ORAL | Status: DC

## 2011-11-15 MED ORDER — METFORMIN HCL 500 MG PO TABS: 1000.0000 mg | ORAL_TABLET | Freq: Two times a day (BID) | ORAL | Status: DC

## 2011-11-15 MED ORDER — LISINOPRIL 40 MG PO TABS: 40.0000 mg | ORAL_TABLET | Freq: Every day | ORAL | Status: DC

## 2011-11-15 MED ORDER — ETODOLAC 300 MG PO CAPS: 300.0000 mg | ORAL_CAPSULE | Freq: Three times a day (TID) | ORAL | Status: DC

## 2011-11-15 NOTE — ED Provider Notes (Signed)
History     CSN: [id number] Arrival date & time: 11/15/2011 12:30 PM   First MD Initiated Contact with Patient 11/15/11 1321      No chief complaint on file.   (Consider location/radiation/quality/duration/timing/severity/associated sxs/prior treatment) HPI Pt states he feels like he is walking on pins and needles.  Pt states it started two weeks ago.  Pt states it can still feel things in his feet but it is not normal.  No back pain.  No fevers.  Pt states he can move his legs OK but his feet don't feel right.   No vomiting or diarrhea.  Pt denies anything that makes it worse or better except when he hits his feet on something.  He has never had this before.  Pt has not been taking his diabetes medication in a while.     Past Medical History  Diagnosis Date  . Hypertension   . Diabetes mellitus     History reviewed. No pertinent past surgical history.  History reviewed. No pertinent family history.  History  Substance Use Topics  . Smoking status: Current Everyday Smoker -- 0.5 packs/day  . Smokeless tobacco: Not on file  . Alcohol Use: Yes     40 oz per day      Review of Systems  All other systems reviewed and are negative.    Allergies  Review of patient's allergies indicates no known allergies.  Home Medications   Current Outpatient Rx  Name Route Sig Dispense Refill  . AMLODIPINE BESYLATE 2.5 MG PO TABS Oral Take 2.5 mg by mouth daily.      . FUROSEMIDE 80 MG PO TABS Oral Take 80 mg by mouth daily.      Marland Kitchen LISINOPRIL 40 MG PO TABS Oral Take 40 mg by mouth daily.      Marland Kitchen METFORMIN HCL 500 MG PO TABS Oral Take 1,000 mg by mouth 2 (two) times daily with a meal.        BP 168/108  Pulse 87  Temp(Src) 98.5 F (36.9 C) (Oral)  Resp 15  SpO2 98%  Physical Exam  Nursing note and vitals reviewed. Constitutional: He appears well-developed and well-nourished. No distress.  HENT:  Head: Normocephalic and atraumatic.  Right Ear: External ear normal.  Left  Ear: External ear normal.  Eyes: Conjunctivae are normal. Right eye exhibits no discharge. Left eye exhibits no discharge. No scleral icterus.  Neck: Neck supple. No tracheal deviation present.  Cardiovascular: Normal rate, regular rhythm and intact distal pulses.   Pulmonary/Chest: Effort normal and breath sounds normal. No stridor. No respiratory distress. He has no wheezes. He has no rales.  Abdominal: Soft. Bowel sounds are normal. He exhibits no distension. There is no tenderness. There is no rebound and no guarding.  Musculoskeletal: He exhibits no edema and no tenderness.  Neurological: He is alert. He has normal strength. He displays abnormal reflex. He displays no atrophy and no tremor. No sensory deficit. Cranial nerve deficit:  no gross defecits noted. He exhibits normal muscle tone. He displays no seizure activity. Coordination normal.  Reflex Scores:      Patellar reflexes are 0 on the right side and 0 on the left side.      Achilles reflexes are 0 on the right side and 0 on the left side.      5/5 strength bilateral lower extrem hip flexion, plantar flexion, diminished strength dorsiflexion bilaterally  Skin: Skin is warm and dry. No rash noted.  Psychiatric: He has  a normal mood and affect.    ED Course  Procedures (including critical care time)  Date: 11/15/2011  Rate: 80  Rhythm: normal sinus rhythm  QRS Axis: normal  Intervals: normal  ST/T Wave abnormalities: normal  Conduction Disutrbances:none  Narrative Interpretation: consider anteroseptal infarct, st-t changes resolved since last tracing  Old EKG Reviewed: changes noted   Labs Reviewed  CBC - Abnormal; Notable for the following:    Platelets 128 (*)    All other components within normal limits  DIFFERENTIAL - Abnormal; Notable for the following:    Monocytes Relative 13 (*)    All other components within normal limits  COMPREHENSIVE METABOLIC PANEL - Abnormal; Notable for the following:    Potassium 3.1  (*)    Glucose, Bld 220 (*)    Albumin 2.8 (*)    AST 78 (*)    ALT 89 (*)    All other components within normal limits  GLUCOSE, CAPILLARY - Abnormal; Notable for the following:    Glucose-Capillary 246 (*)    All other components within normal limits  CK TOTAL AND CKMB  POCT I-STAT TROPONIN I  I-STAT TROPONIN I   Ct Head Wo Contrast  11/15/2011  *RADIOLOGY REPORT*  Clinical Data: 50 year old male with hand and feet numbness. Altered gait and difficulty walking.  CT HEAD WITHOUT CONTRAST  Technique:  Contiguous axial images were obtained from the base of the skull through the vertex without contrast.  Comparison: None  Findings: No acute intracranial abnormalities are identified, including mass lesion or mass effect, hydrocephalus, extra-axial fluid collection, midline shift, hemorrhage, or acute infarction.  The visualized bony calvarium is unremarkable.  IMPRESSION: No evidence of acute intracranial abnormality.  Original Report Authenticated By: Rosendo Gros, M.D.   Mr Lumbar Spine W Wo Contrast  11/15/2011  *RADIOLOGY REPORT*  Clinical Data: Weakness and numbness.  Bilateral foot numbness. Epidural abscess.  History of IV drug abuse.  MRI LUMBAR SPINE WITHOUT AND WITH CONTRAST  Technique:  Multiplanar and multiecho pulse sequences of the lumbar spine were obtained without and with intravenous contrast.  Contrast: 20mL MULTIHANCE GADOBENATE DIMEGLUMINE 529 MG/ML IV SOLN  Comparison: None.  Findings: The numbering convention used for this exam terms L5-S1 as the last full intervertebral disc space above the sacrum.  There is a mild levoconvex lumbar scoliosis with the apex at L4.  Bone marrow signal shows suppression of fatty marrow, which is a nonspecific finding, commonly associated with anemia, chronic disease, cigarette smoking, and obesity.  A nonenhancing 1 cm cystic lesion is present in the left interpolar kidney, consistent with simple cyst. There is a small septated cystic lesion  posterior to the left L2 lamina, most compatible with a ganglion/synovial cyst arising from the posterior L2-L3 facet joint.  The spinal cord terminates posterior to the L1-L2 interspace.  Vertebral body height is preserved.  L1-L2:  Negative.  L2-L3:  Negative.  L3-L4:  Negative.  L4-L5:  Disc desiccation.  Left foraminal annular tear and shallow disc protrusion potentially irritating the exiting left L4 nerve. No neural compression.  Right foramen is patent.  Central canal and lateral recesses patent.  Facet joints appear within normal limits.  L5-S1:  Normal appearing disc.  Posterior small extraspinal synovial cysts arising from the dorsal aspect of the L5-S1 facet joints.  IMPRESSION: 1.  Negative for epidural abscess. 2.  L4-L5 shallow left foraminal protrusion with associated annular tear potentially irritating the left L4 nerve.  No compressive stenosis.  Original Report  Authenticated By: Andreas Newport, M.D.     1. Peripheral neuropathy   2. Diabetes mellitus       MDM  Patient presents with difficulty ambulating. He appears to have difficulty with dorsiflexion. There is no known trauma. He denies back pain. We'll evaluate with lumbar spine to assess for spinal abscess or tumor.  5:28 PM patient without any signs of acute neurologic impairment. He does have diabetes and I suspect that his numbness is from diabetic neuropathy. I question whether some of his difficulty with dorsiflexion is due to decreased effort however he will need further evaluation to assess for other causes. At this point he does I doubt any ascending motor paralysis.  Patient has been referred to health service clinic for further evaluation       Celene Kras, MD 11/15/11 1729

## 2011-11-15 NOTE — ED Provider Notes (Signed)
Medical screening examination/treatment/procedure(s) were performed by non-physician practitioner and as supervising physician I was immediately available for consultation/collaboration.   Laray Anger, DO 11/15/11 2319

## 2011-11-15 NOTE — ED Notes (Signed)
Trouble walking; believes it may be neuropathy. In pain. Onset x 2 days

## 2011-11-15 NOTE — ED Provider Notes (Signed)
ED Course: Medical Screening exam: Brad [name] is a 50 y.o. male complaining of bilateral ankle and foot numbness and pain. Describes it as a sensation of pins and needles. However this is causing severe difficulties in walking. Patient cannulated document appear to have almost a bilateral foot drop. Also complaining of numbness and tingling in the tips of his fingers bilaterally he denies any other symptoms. Neurological exam benign. Ambulation abnormal. Strength in hands and lower extremities normal with the exception of the ankles. Ankles appear weaker than usual. Patient denies back pain, abdominal pain, headache, change in vision, change in speech.  Thomasene Lot, [place] 11/15/11 1413

## 2011-11-15 NOTE — ED Notes (Signed)
Patient C/O loosing sensation and control of his feel Onset was 2 weeks ago and was abrupt. Patient describes the feeling was like walking on bubbles. He has had increasingly more difficulty walking. His feet now feel like pins are sticking in them.  States that he has difficulty moving his feet which is impeding his ability to walk.  He C/O pain down both legs.

## 2012-01-29 ENCOUNTER — Encounter (HOSPITAL_COMMUNITY): Payer: Self-pay | Admitting: *Deleted

## 2012-01-29 ENCOUNTER — Emergency Department (INDEPENDENT_AMBULATORY_CARE_PROVIDER_SITE_OTHER)
Admission: EM | Admit: 2012-01-29 | Discharge: 2012-01-29 | Disposition: A | Discharge status: Home / Self Care | Payer: Self-pay | Source: Home / Self Care | Attending: Family Medicine | Admitting: Family Medicine

## 2012-01-29 DIAGNOSIS — F1120 Opioid dependence, uncomplicated: Secondary | ICD-10-CM | POA: Insufficient documentation

## 2012-01-29 DIAGNOSIS — I10 Essential (primary) hypertension: Secondary | ICD-10-CM

## 2012-01-29 DIAGNOSIS — E1149 Type 2 diabetes mellitus with other diabetic neurological complication: Secondary | ICD-10-CM

## 2012-01-29 DIAGNOSIS — E1142 Type 2 diabetes mellitus with diabetic polyneuropathy: Secondary | ICD-10-CM

## 2012-01-29 MED ORDER — AMLODIPINE BESYLATE 2.5 MG PO TABS: 2.5000 mg | ORAL_TABLET | Freq: Every day | ORAL | Status: DC

## 2012-01-29 MED ORDER — LISINOPRIL 20 MG PO TABS: 20.0000 mg | ORAL_TABLET | Freq: Every day | ORAL | Status: DC

## 2012-01-29 MED ORDER — METFORMIN HCL 1000 MG PO TABS: 1000.0000 mg | ORAL_TABLET | Freq: Two times a day (BID) | ORAL | Status: DC

## 2012-01-29 NOTE — ED Provider Notes (Signed)
Medical screening examination/treatment/procedure(s) were performed by non-physician practitioner and as supervising physician I was immediately available for consultation/collaboration.   Barkley Bruns MD.    Barkley Bruns, MD 01/29/12 2032
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
## 2012-01-29 NOTE — ED Provider Notes (Signed)
Brad [name] is a 51 y.o. male who presents to Urgent Care today for blood pressure and diabetes. The patient was recently discharged from prison and is working to reestablish care for his diabetes, blood pressure, heroin addiction. He received treatment while in prison and has been clean for 90 days.  Additionally he is receiving psychiatric care and monitor Center and occupational rehabilitation in town.  He feels well and wishes to restart his outpatient medications. He denies any fevers chills chest pains palpitations dyspnea or edema. He was provided with clonidine today by a nurse prior to coming in.   PMH reviewed. Significant for diabetes hypertension and former heroin use ROS as above otherwise neg Medications reviewed. No current facility-administered medications for this encounter.   Current Outpatient Prescriptions  Medication Sig Dispense Refill  . amLODipine (NORVASC) 2.5 MG tablet Take 1 tablet (2.5 mg total) by mouth daily.  30 tablet  0  . lisinopril (PRINIVIL,ZESTRIL) 20 MG tablet Take 1 tablet (20 mg total) by mouth daily.  30 tablet  0  . metFORMIN (GLUCOPHAGE) 1000 MG tablet Take 1 tablet (1,000 mg total) by mouth 2 (two) times daily with a meal.  60 tablet  0  . DISCONTD: amLODipine (NORVASC) 2.5 MG tablet Take 1 tablet (2.5 mg total) by mouth daily.  30 tablet  0  . DISCONTD: lisinopril (PRINIVIL,ZESTRIL) 40 MG tablet Take 1 tablet (40 mg total) by mouth daily.  30 tablet  1  . DISCONTD: metFORMIN (GLUCOPHAGE) 500 MG tablet Take 2 tablets (1,000 mg total) by mouth 2 (two) times daily with a meal.  60 tablet  1    Exam:  BP 140/86  Pulse 72  Temp(Src) 98.6 F (37 C) (Oral)  Resp 20  SpO2 100% Gen: Well NAD HEENT: EOMI,  MMM Lungs: CTABL Nl WOB Heart: RRR no MRG Abd: NABS, NT, ND Exts: Non edematous BL  LE, warm and well perfused.   Assessment and Plan: 51 year old male with hypertension and diabetes. Plan to restart metformin at 1000 mg twice a day.  Additionally we'll restart his blood pressure medicines and a bit lower dose than prior. Encouraged him to reestablish care with a primary care provider and provided him with instructions on how to do this.  If he is unable to reestablish care with a primary care provider I asked him to return to urgent care and no more than 30 days.  I also asked him to check his blood pressure at the pharmacy over the next few weeks . He expresses understanding .    Clementeen Graham, MD 01/29/12 1902

## 2012-02-13 ENCOUNTER — Other Ambulatory Visit: Payer: Self-pay

## 2012-02-13 ENCOUNTER — Encounter (HOSPITAL_COMMUNITY): Payer: Self-pay | Admitting: *Deleted

## 2012-02-13 ENCOUNTER — Emergency Department (HOSPITAL_COMMUNITY)
Admission: EM | Admit: 2012-02-13 | Discharge: 2012-02-13 | Disposition: A | Discharge status: Home / Self Care | Payer: Self-pay | Attending: Emergency Medicine | Admitting: Emergency Medicine

## 2012-02-13 DIAGNOSIS — R739 Hyperglycemia, unspecified: Secondary | ICD-10-CM

## 2012-02-13 DIAGNOSIS — E119 Type 2 diabetes mellitus without complications: Secondary | ICD-10-CM | POA: Insufficient documentation

## 2012-02-13 DIAGNOSIS — I10 Essential (primary) hypertension: Secondary | ICD-10-CM | POA: Insufficient documentation

## 2012-02-13 DIAGNOSIS — F17200 Nicotine dependence, unspecified, uncomplicated: Secondary | ICD-10-CM | POA: Insufficient documentation

## 2012-02-13 LAB — URINE MICROSCOPIC-ADD ON

## 2012-02-13 LAB — DIFFERENTIAL
Basophils Absolute: 0 10*3/uL (ref 0.0–0.1)
Basophils Relative: 1 % (ref 0–1)
Eosinophils Absolute: 0.1 10*3/uL (ref 0.0–0.7)
Eosinophils Relative: 3 % (ref 0–5)
Lymphocytes Relative: 29 % (ref 12–46)
Lymphs Abs: 1.5 10*3/uL (ref 0.7–4.0)
Monocytes Absolute: 0.5 10*3/uL (ref 0.1–1.0)
Monocytes Relative: 10 % (ref 3–12)
Neutro Abs: 2.9 10*3/uL (ref 1.7–7.7)
Neutrophils Relative %: 58 % (ref 43–77)

## 2012-02-13 LAB — CBC
HCT: 33.9 % — ABNORMAL LOW (ref 39.0–52.0)
Hemoglobin: 12.1 g/dL — ABNORMAL LOW (ref 13.0–17.0)
MCH: 34 pg (ref 26.0–34.0)
MCHC: 35.7 g/dL (ref 30.0–36.0)
MCV: 95.2 fL (ref 78.0–100.0)
Platelets: 165 10*3/uL (ref 150–400)
RBC: 3.56 MIL/uL — ABNORMAL LOW (ref 4.22–5.81)
RDW: 13.8 % (ref 11.5–15.5)
WBC: 5.1 10*3/uL (ref 4.0–10.5)

## 2012-02-13 LAB — BASIC METABOLIC PANEL
BUN: 14 mg/dL (ref 6–23)
CO2: 24 mEq/L (ref 19–32)
Calcium: 8.7 mg/dL (ref 8.4–10.5)
Chloride: 99 mEq/L (ref 96–112)
Creatinine, Ser: 0.99 mg/dL (ref 0.50–1.35)
GFR calc Af Amer: >90 mL/min (ref >90)
GFR calc non Af Amer: >90 mL/min (ref >90)
Glucose, Bld: 533 mg/dL — ABNORMAL HIGH (ref 70–99)
Potassium: 3.8 mEq/L (ref 3.5–5.1)
Sodium: 133 mEq/L — ABNORMAL LOW (ref 135–145)

## 2012-02-13 LAB — GLUCOSE, CAPILLARY
Glucose-Capillary: 571 mg/dL (ref 70–99)
Glucose-Capillary: 419 mg/dL — ABNORMAL HIGH (ref 70–99)
Glucose-Capillary: 397 mg/dL — ABNORMAL HIGH (ref 70–99)
Glucose-Capillary: 279 mg/dL — ABNORMAL HIGH (ref 70–99)

## 2012-02-13 LAB — URINALYSIS, ROUTINE W REFLEX MICROSCOPIC
Bilirubin Urine: NEGATIVE
Glucose, UA: >1000 mg/dL — AB
Hgb urine dipstick: NEGATIVE
Ketones, ur: NEGATIVE mg/dL
Leukocytes, UA: NEGATIVE
Nitrite: NEGATIVE
Protein, ur: 30 mg/dL — AB
Specific Gravity, Urine: 1.031 — ABNORMAL HIGH (ref 1.005–1.030)
Urobilinogen, UA: 1 mg/dL (ref 0.0–1.0)
pH: 7 (ref 5.0–8.0)

## 2012-02-13 MED ORDER — GABAPENTIN 100 MG PO CAPS: 100.0000 mg | ORAL_CAPSULE | Freq: Three times a day (TID) | ORAL | Status: DC

## 2012-02-13 MED ORDER — INSULIN ASPART 100 UNIT/ML ~~LOC~~ SOLN
10.0000 [IU] | Freq: Once | SUBCUTANEOUS | Status: AC
  Administered 2012-02-13: 10 [IU] via INTRAVENOUS
  Filled 2012-02-13: qty 1

## 2012-02-13 MED ORDER — MORPHINE SULFATE 4 MG/ML IJ SOLN
4.0000 mg | Freq: Once | INTRAMUSCULAR | Status: AC
  Administered 2012-02-13: 4 mg via INTRAVENOUS
  Filled 2012-02-13: qty 1

## 2012-02-13 MED ORDER — SODIUM CHLORIDE 0.9 % IV BOLUS (SEPSIS)
1000.0000 mL | Freq: Once | INTRAVENOUS | Status: AC
  Administered 2012-02-13: 1000 mL via INTRAVENOUS

## 2012-02-13 NOTE — ED Provider Notes (Signed)
History     CSN: [id number]  Arrival date & time 02/13/12  1319   First MD Initiated Contact with Patient 02/13/12 1327      Chief Complaint  Patient presents with  . Hyperglycemia    (Consider location/radiation/quality/duration/timing/severity/associated sxs/prior treatment) HPI Comments: Patient reports that he has been having pain in both of his feet.  He states that the pain feels like "pins and needles."  He is concerned that it is diabetic neuropathy.  Therefore, he wanted to come to the ED so he could get medication for neuropathy.  EMS was called for this reason and his blood sugar with EMS was greater than 600.  He was given a  500cc NS bolus by EMS.  Upon arrival in the ED his CBG was 571.  He reports that he does not check his blood sugar at home because he is unable to afford a glucometer or test strips.  He currently is on Metformin 1000mg  bid for DM.  He does not have a PCP and his diabetes has been managed by Urgent Care.  He reports that he did take his Metformin this morning.  He reports that prior to 01-15-12 he was on insulin but went off of the insulin because he could not afford it.  He reports that he was on Novalog 70/30.  He is unsure of the dose.  He denies any nausea, vomiting, abdominal pain, headache, SOB, chest pain, fever, or chills.  Other than pain in both feet, patient is completely asymptomatic.    The history is provided by the patient.    Past Medical History  Diagnosis Date  . Hypertension   . Diabetes mellitus     No past surgical history on file.  No family history on file.  History  Substance Use Topics  . Smoking status: Current Everyday Smoker -- 0.5 packs/day  . Smokeless tobacco: Not on file  . Alcohol Use: Yes     40 oz per day      Review of Systems  Constitutional: Negative for fever and chills.  Respiratory: Negative for cough and shortness of breath.   Cardiovascular: Negative for chest pain.  Gastrointestinal: Negative for  nausea, vomiting and abdominal pain.  Neurological: Negative for dizziness, syncope and light-headedness.    Allergies  Review of patient's allergies indicates no known allergies.  Home Medications   Current Outpatient Rx  Name Route Sig Dispense Refill  . LASIX PO Oral Take by mouth.    . AMLODIPINE BESYLATE 2.5 MG PO TABS Oral Take 1 tablet (2.5 mg total) by mouth daily. 30 tablet 0  . LISINOPRIL 20 MG PO TABS Oral Take 1 tablet (20 mg total) by mouth daily. 30 tablet 0  . METFORMIN HCL 1000 MG PO TABS Oral Take 1 tablet (1,000 mg total) by mouth 2 (two) times daily with a meal. 60 tablet 0    BP 158/94  Pulse 88  Temp(Src) 98.1 F (36.7 C) (Oral)  Resp 20  SpO2 99%  Physical Exam  Nursing note and vitals reviewed. Constitutional: He is oriented to person, place, and time. He appears well-developed and well-nourished. No distress.  HENT:  Head: Normocephalic and atraumatic.  Neck: Normal range of motion. Neck supple.  Cardiovascular: Normal rate, regular rhythm and normal heart sounds.   Pulmonary/Chest: Effort normal and breath sounds normal. No respiratory distress. He has no wheezes.  Abdominal: Soft. Bowel sounds are normal. He exhibits no distension and no mass. There is no tenderness. There  is no rebound and no guarding.  Musculoskeletal: Normal range of motion.  Neurological: He is alert and oriented to person, place, and time. He has normal strength. No sensory deficit.       Distal sensation intact bilaterally.  Skin: Skin is warm and dry. No rash noted. He is not diaphoretic. No erythema.  Psychiatric: He has a normal mood and affect.    ED Course  Procedures (including critical care time)  Labs Reviewed - No data to display No results found.   No diagnosis found.  2:40 PM  Patient given 1 L NS bolus.  Will recheck blood sugar.  BMP still pending. 2:50 PM Case management consulted to see if patient can get help paying for her insulin and also get  assistance paying for glucometer and test strips.  Case management reports that patient can have financial assistance to help him pay for the insulin, but cannot get any financial assistance with glucometer and test strips. 3:37 PM Discussed with outpatient clinics to ensure that patient has follow up.  Patient has an appointment scheduled for Monday February 25th at 3:15pm. 3:44 PM Patient with a blood sugar of 419 after 1 Liter NS.  Will order patient 10 Units regular insulin and then recheck blood sugar. 4:23 PM Patient signed out to Sheran Luz, MD.  He will follow up on blood sugar and discharge patient  MDM  Patient with elevated blood sugar.  Anion gap=10.  No ketones in the urine.  Therefore, patient does not appear to be in DKA.  Patient not having any symptoms associated with hyperglycemia.  Patient has follow up appointment scheduled in 3 days with outpatient clinic.  Patient does not have a glucometer or test strips at home and is unsure what dose of insulin he was on previously.  Therefore, do not feel comfortable starting patient on insulin at this time.  Patient instructed to continue current dose of metformin and follow up on Monday as scheduled.        Pascal Lux [place], PA-C 02/13/12 1625

## 2012-02-13 NOTE — ED Notes (Signed)
CBG was 279 mg/dl

## 2012-02-13 NOTE — ED Notes (Signed)
Per ems- CBC was greater than 600 on machine. Pt from [facility] who called EMS about "diabetic issues". 20g LAC received of NS in route. bp 174/100 hr 94 R 18 96% on room air

## 2012-02-13 NOTE — ED Notes (Signed)
571 mg/dl was CBG

## 2012-02-13 NOTE — Progress Notes (Signed)
   CARE MANAGEMENT NOTE 02/13/2012  Patient:  DAELEN, BELVEDERE   Account Number:  [id number]  Date Initiated:  02/13/2012  Documentation initiated by:  Tera Mater  Subjective/Objective Assessment:   51yo male came to Emergency Department with c/o high blood sugars.     Action/Plan:   Medication Assistance   Anticipated DC Date:  02/13/2012   Anticipated DC Plan:  HOME/SELF CARE      DC Planning Services  CM consult  Medication Assistance      Choice offered to / List presented to:             Status of service:  Completed, signed off Medicare Important Message given?   (If response is "NO", the following Medicare IM given date fields will be blank) Date Medicare IM given:   Date Additional Medicare IM given:    Discharge Disposition:  HOME/SELF CARE  Per UR Regulation:    Comments:  02/13/12 1500 Received a call from RN about pt. having a need for medication assistance.  Pt. cannot afford his insulin and he came into Emergency Room with this dx. Called [facility] and pt. is eligible for indigent fund. Tera Mater, RN, BSN [phone]

## 2012-02-13 NOTE — Discharge Instructions (Signed)
You have an appointment scheduled on Monday February 25 at 3:15pm with Dr. Thad Ranger.  It is very important that you go to this appointment so that your diabetes can be effectively managed.   Continue taking your Metformin until you meet with Dr. Thad Ranger.  Continue taking all other medications as you previously were.Hyperglycemia Hyperglycemia occurs when the glucose (sugar) in your blood is too high. Hyperglycemia can happen for many reasons, but it most often happens to people who do not know they have diabetes or are not managing their diabetes properly.  CAUSES  Whether you have diabetes or not, there are other causes of hyperglycemia. Hyperglycemia can occur when you have diabetes, but it can also occur in other situations that you might not be as aware of, such as: Diabetes  If you have diabetes and are having problems controlling your blood glucose, hyperglycemia could occur because of some of the following reasons:   Not following your meal plan.   Not taking your diabetes medications or not taking it properly.   Exercising less or doing less activity than you normally do.   Being sick.  Pre-diabetes  This cannot be ignored. Before people develop Type 2 diabetes, they almost always have "pre-diabetes." This is when your blood glucose levels are higher than normal, but not yet high enough to be diagnosed as diabetes. Research has shown that some long-term damage to the body, especially the heart and circulatory system, may already be occurring during pre-diabetes. If you take action to manage your blood glucose when you have pre-diabetes, you may delay or prevent Type 2 diabetes from developing.  Stress  If you have diabetes, you may be "diet" controlled or on oral medications or insulin to control your diabetes. However, you may find that your blood glucose is higher than usual in the hospital whether you have diabetes or not. This is often referred to as "stress hyperglycemia." Stress  can elevate your blood glucose. This happens because of hormones put out by the body during times of stress. If stress has been the cause of your high blood glucose, it can be followed regularly by your caregiver. That way he/she can make sure your hyperglycemia does not continue to get worse or progress to diabetes.  Steroids  Steroids are medications that act on the infection fighting system (immune system) to block inflammation or infection. One side effect can be a rise in blood glucose. Most people can produce enough extra insulin to allow for this rise, but for those who cannot, steroids make blood glucose levels go even higher. It is not unusual for steroid treatments to "uncover" diabetes that is developing. It is not always possible to determine if the hyperglycemia will go away after the steroids are stopped. A special blood test called an A1c is sometimes done to determine if your blood glucose was elevated before the steroids were started.  SYMPTOMS  Thirsty.   Frequent urination.   Dry mouth.   Blurred vision.   Tired or fatigue.   Weakness.   Sleepy.   Tingling in feet or leg.  DIAGNOSIS  Diagnosis is made by monitoring blood glucose in one or all of the following ways:  A1c test. This is a chemical found in your blood.   Fingerstick blood glucose monitoring.   Laboratory results.  TREATMENT  First, knowing the cause of the hyperglycemia is important before the hyperglycemia can be treated. Treatment may include, but is not be limited to:  Education.   Change  or adjustment in medications.   Change or adjustment in meal plan.   Treatment for an illness, infection, etc.   More frequent blood glucose monitoring.   Change in exercise plan.   Decreasing or stopping steroids.   Lifestyle changes.  HOME CARE INSTRUCTIONS   Test your blood glucose as directed.   Exercise regularly. Your caregiver will give you instructions about exercise. Pre-diabetes or  diabetes which comes on with stress is helped by exercising.   Eat wholesome, balanced meals. Eat often and at regular, fixed times. Your caregiver or nutritionist will give you a meal plan to guide your sugar intake.   Being at an ideal weight is important. If needed, losing as little as 10 to 15 pounds may help improve blood glucose levels.  SEEK MEDICAL CARE IF:   You have questions about medicine, activity, or diet.   You continue to have symptoms (problems such as increased thirst, urination, or weight gain).  SEEK IMMEDIATE MEDICAL CARE IF:   You are vomiting or have diarrhea.   Your breath smells fruity.   You are breathing faster or slower.   You are very sleepy or incoherent.   You have numbness, tingling, or pain in your feet or hands.   You have chest pain.   Your symptoms get worse even though you have been following your caregiver's orders.   If you have any other questions or concerns.  Document Released: 06/03/2001 Document Revised: 08/20/2011 Document Reviewed: 07/30/2009 [facility] Patient Information 2012 [place], [place].

## 2012-02-13 NOTE — ED Provider Notes (Signed)
Medical screening examination/treatment/procedure(s) were conducted as a shared visit with non-physician practitioner(s) and myself.  I personally evaluated the patient during the encounter.   51 year old male with hyperglycemia. Patient is known diabetic. Previously on insulin but stopped taking because is unable to afford it. Workup today does show hyperglycemia in the 500s but he does not have an anion gap and bicarbonate is normal. IV fluids and insulin was given. Social work was consuledt to arrange for medications. Herbert Seta called the outpatient clinics and arranged for an appointment for the patient on this upcoming Monday for further evaluation, medicine management and education.  Raeford Razor, MD 02/13/12 1626

## 2012-02-13 NOTE — ED Notes (Signed)
CBG was 397 mg/dl

## 2012-02-13 NOTE — Progress Notes (Signed)
Orthopedic Tech Progress Note Patient Details:  Brad [name] 1961-06-19 [id number]  Other Ortho Devices Type of Ortho Device: Crutches Ortho Device Interventions: Application   Nikki Dom 02/13/2012, 7:33 PM

## 2012-02-16 ENCOUNTER — Encounter: Payer: Self-pay | Admitting: Internal Medicine

## 2012-02-16 ENCOUNTER — Ambulatory Visit (INDEPENDENT_AMBULATORY_CARE_PROVIDER_SITE_OTHER): Payer: Self-pay | Admitting: Internal Medicine

## 2012-02-16 VITALS — BP 158/98 | HR 111 | Temp 97.5°F | Ht 71.0 in | Wt 181.5 lb

## 2012-02-16 DIAGNOSIS — R748 Abnormal levels of other serum enzymes: Secondary | ICD-10-CM

## 2012-02-16 DIAGNOSIS — R7401 Elevation of levels of liver transaminase levels: Secondary | ICD-10-CM

## 2012-02-16 DIAGNOSIS — Z79899 Other long term (current) drug therapy: Secondary | ICD-10-CM

## 2012-02-16 DIAGNOSIS — R369 Urethral discharge, unspecified: Secondary | ICD-10-CM

## 2012-02-16 DIAGNOSIS — K219 Gastro-esophageal reflux disease without esophagitis: Secondary | ICD-10-CM | POA: Insufficient documentation

## 2012-02-16 DIAGNOSIS — E1142 Type 2 diabetes mellitus with diabetic polyneuropathy: Secondary | ICD-10-CM

## 2012-02-16 DIAGNOSIS — F1911 Other psychoactive substance abuse, in remission: Secondary | ICD-10-CM | POA: Insufficient documentation

## 2012-02-16 DIAGNOSIS — I10 Essential (primary) hypertension: Secondary | ICD-10-CM

## 2012-02-16 DIAGNOSIS — R7402 Elevation of levels of lactic acid dehydrogenase (LDH): Secondary | ICD-10-CM

## 2012-02-16 DIAGNOSIS — E1149 Type 2 diabetes mellitus with other diabetic neurological complication: Secondary | ICD-10-CM

## 2012-02-16 LAB — LIPID PANEL
Cholesterol: 160 mg/dL (ref 0–200)
HDL: 21 mg/dL — ABNORMAL LOW (ref >39)
Total CHOL/HDL Ratio: 7.6 Ratio
Triglycerides: 558 mg/dL — ABNORMAL HIGH (ref <150)

## 2012-02-16 LAB — COMPREHENSIVE METABOLIC PANEL
ALT: 284 U/L — ABNORMAL HIGH (ref 0–53)
AST: 276 U/L — ABNORMAL HIGH (ref 0–37)
Albumin: 2.9 g/dL — ABNORMAL LOW (ref 3.5–5.2)
Alkaline Phosphatase: 180 U/L — ABNORMAL HIGH (ref 39–117)
BUN: 17 mg/dL (ref 6–23)
CO2: 25 mEq/L (ref 19–32)
Calcium: 9.4 mg/dL (ref 8.4–10.5)
Chloride: 92 mEq/L — ABNORMAL LOW (ref 96–112)
Creat: 0.92 mg/dL (ref 0.50–1.35)
Glucose, Bld: 611 mg/dL (ref 70–99)
Potassium: 4.1 mEq/L (ref 3.5–5.3)
Sodium: 129 mEq/L — ABNORMAL LOW (ref 135–145)
Total Bilirubin: 0.6 mg/dL (ref 0.3–1.2)
Total Protein: 8.7 g/dL — ABNORMAL HIGH (ref 6.0–8.3)

## 2012-02-16 LAB — GLUCOSE, CAPILLARY: Glucose-Capillary: 597 mg/dL (ref 70–99)

## 2012-02-16 LAB — POCT GLYCOSYLATED HEMOGLOBIN (HGB A1C): Hemoglobin A1C: 10.3

## 2012-02-16 MED ORDER — OMEPRAZOLE 20 MG PO CPDR: 20.0000 mg | DELAYED_RELEASE_CAPSULE | Freq: Every day | ORAL | Status: DC

## 2012-02-16 MED ORDER — GLIPIZIDE 10 MG PO TABS: 10.0000 mg | ORAL_TABLET | Freq: Two times a day (BID) | ORAL | Status: DC

## 2012-02-16 MED ORDER — LISINOPRIL 40 MG PO TABS: 40.0000 mg | ORAL_TABLET | Freq: Every day | ORAL | Status: DC

## 2012-02-16 MED ORDER — GABAPENTIN 300 MG PO CAPS: 300.0000 mg | ORAL_CAPSULE | Freq: Three times a day (TID) | ORAL | Status: DC

## 2012-02-16 NOTE — Assessment & Plan Note (Signed)
Unclear etiology  Check urine GC/ chlamydia
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
## 2012-02-16 NOTE — Assessment & Plan Note (Signed)
   Will escalate gabapentin to 300mg  TID.

## 2012-02-16 NOTE — Assessment & Plan Note (Signed)
BP Readings from Last 3 Encounters:  02/16/12 158/98  02/13/12 148/95  01/29/12 140/86    Basic Metabolic Panel:    Component Value Date/Time   NA 129* 02/16/2012 1546   K 4.1 02/16/2012 1546   CL 92* 02/16/2012 1546   CO2 25 02/16/2012 1546   BUN 17 02/16/2012 1546   CREATININE 0.92 02/16/2012 1546   CREATININE 0.99 02/13/2012 1406   GLUCOSE 611* 02/16/2012 1546   CALCIUM 9.4 02/16/2012 1546    Assessment: Status: Is not maximized on any of his medications. Has been just going to urgent care or ER for medical management since released from jail in 12/2011.  Disease Control: not controlled  Progress toward goals: unable to assess  Barriers to meeting goals: financial need and no PCP   Plan:  Continue current Amlodipine and Lasix (states he gets LE swelling with reduced doses of lasix)  Increase Lisinopril  At some point, we may need to get an echo to evaluate his reported clinical CHF reported in prior hospitalizations, with consideration to reduce Lasix dosing.

## 2012-02-16 NOTE — Assessment & Plan Note (Signed)
Assessment: Status: Checked CMET today, which shows worsening of liver function from last check in 10/2011. Unclear cause at this point, medication effect less likely as no new medications. He has a long history of alcohol abuse and IV drug abuse (quit in 10/2011), therefore is definitely at increased risk for hepatitis, cirrhosis.  Progress toward goals: deteriorated  Barriers to meeting goals: no barriers identified - has actually stopped his substance abuse at this point, which is great.   Plan:      Check hepatitis panel.  Consider liver ultrasound depending on results of hepatitis panel.  Consider next visit to hold Metformin in setting of elevated liver enzymes.

## 2012-02-16 NOTE — Patient Instructions (Addendum)
   Please follow-up at the clinic in 1 week (at the end of the week), at which time we will reevaluate your blood pressure and blood sugar - OR, please follow-up in the clinic sooner if needed.  There have been changes in your medications:  START glipizide - 1 tablet twice daily for your blood sugar.  INCREASE Lisinopril to 40mg  daily for blood pressure.  INCREASE gabapentin to 300 mg three times a day.  START omeprazole 40 mg daily for reflux.   If you have been started on new medication(s), and you develop symptoms concerning for allergic reaction, including, but not limited to, throat closing, tongue swelling, rash, please stop the medication immediately and call the clinic at [phone], and go to the ER.  If you are diabetic, please bring your meter to your next visit.  If symptoms worsen, or new symptoms arise, please call the clinic or go to the ER.  Please bring all of your medications in a bag to your next visit.    ITEMS TO BRING TO [facility]IG[employer] Items required to complete an Eligibility Application   1. Picture ID (Can't be expired) 2. Current Bill to establish proof of residency 3. W-2 & Tax return (if self-employed include "Schedule C"), if not filing Form 4506 4. 4 current Pay stubs for this year 5. Printout of other income (Social security, unemployment, child support, workmen's comp) 6. Food stamp award letter, if receiving  7. Life Insurance (Need copy of the front page, showing name Ins Co. Name, and face amount). 8. Statement for pension, 401-K, IRS (needs to have current balance) 9. Tax Value for cars, houses, mobile homes, and land (Get from [facility] Tax Department) 10. Disability Paperwork (showing status of case) 11. College students: Print out of [employer] received, tuition cost, books, etc. 12. If no Income: Engineer, maintenance of support for free shelter, money, food, Catering manager.  Bring all that you can to your follow up appointment  to start the process.

## 2012-02-16 NOTE — Assessment & Plan Note (Addendum)
Labs: Lab Results  Component Value Date   HGBA1C 10.3 02/16/2012   HGBA1C 5.9 06/16/2010   CREATININE 0.92 02/16/2012   CREATININE 0.99 02/13/2012   CHOL 164    06/16/2010   HDL 68 06/16/2010   TRIG 59 06/16/2010    Last eye exam and foot exam: No results found for this basename: HMDIABEYEEXA, HMDIABFOOTEX     Assessment: Status: Patient has blood sugar > 575 mg/dL per CBG and STAT CMET shows blood sugar of 611 likely multifactorial in setting of recent increase in candy intake in setting of inadequately treated diabetes for which he is only on Metformin 1000mg  BID currently. He previously required insulin during his incarceration in 10/2011-12/2011 --> however, now states he cannot pay for anything, and that he won't get his orange card until next week.  He is completely asymptomatic at this point in terms of NO nausea, vomiting, abdominal pain, blurriness of vision, mental status changes. There is no obvious source of infection.  Per STAT CMET, his gap is 12 today.     Disease Control: not controlled  Progress toward goals: deteriorated  Barriers to meeting goals: financial need   Plan: Glucometer log was not reviewed today, as pt did not have glucometer available for review.      Continue Metformin 1000mg  BID  Start Glipizide 10 mg BID  Will likely need insulin therapy - however, since he cannot afford any insulin until he at least gets his orange card, we will first attempt oral medications and for him to stop eating all of this candy.  Reassess in 3-4 days the response to oral medication addition.  If still with elevated blood sugars, can consider addition of 70/30 insulin at that time, which we can provide a sample, until he can get an orange card next week - can plan to do 0.5 units/ kg / day - divided in 2 doses for the BID dosing of 70/30.  Lupita Leash provided brief counsel today, and I would encourage for pt to see her again on follow-up.  Pt counseled that if he has  worsening symptoms, he should report to ER immediately.  Of note, pt does not have a glucometer or test strips currently, cannot afford, will need to get him an RX so that when he gets orange card, he can get filled (will need when he is starting insulin at least).  Foot exam, A1c, microalb/cr today.  Will do other preventative care such as PCV and eye exam after gets orange card.

## 2012-02-16 NOTE — Progress Notes (Signed)
Lisinopril, Neurontin, Omeprazole, and Glipizide rxs faxed to [facility] on [address] per pt's request.

## 2012-02-16 NOTE — Assessment & Plan Note (Signed)
Assessment: Status: Contributing factors include NSAID use  Disease Control: controlled  Progress toward goals: at goal  Barriers to meeting goals: financial need   Plan:      Rx for PPI is written  He should alert me if there are persistent symptoms, dysphagia, weight loss or GI bleeding.  GERD tips reviewed including caffeine reduction, dietary changes, elevate HOB, NPO after supper, tobacco cessation, and handout provided.

## 2012-02-17 ENCOUNTER — Encounter: Payer: Self-pay | Admitting: Internal Medicine

## 2012-02-17 ENCOUNTER — Ambulatory Visit (INDEPENDENT_AMBULATORY_CARE_PROVIDER_SITE_OTHER): Payer: Self-pay | Admitting: Internal Medicine

## 2012-02-17 VITALS — BP 150/97 | HR 101 | Temp 97.1°F | Ht 71.0 in | Wt 181.5 lb

## 2012-02-17 DIAGNOSIS — F17200 Nicotine dependence, unspecified, uncomplicated: Secondary | ICD-10-CM

## 2012-02-17 DIAGNOSIS — R369 Urethral discharge, unspecified: Secondary | ICD-10-CM

## 2012-02-17 DIAGNOSIS — R748 Abnormal levels of other serum enzymes: Secondary | ICD-10-CM

## 2012-02-17 DIAGNOSIS — R7402 Elevation of levels of lactic acid dehydrogenase (LDH): Secondary | ICD-10-CM

## 2012-02-17 DIAGNOSIS — E1149 Type 2 diabetes mellitus with other diabetic neurological complication: Secondary | ICD-10-CM

## 2012-02-17 DIAGNOSIS — I10 Essential (primary) hypertension: Secondary | ICD-10-CM

## 2012-02-17 DIAGNOSIS — Z720 Tobacco use: Secondary | ICD-10-CM

## 2012-02-17 DIAGNOSIS — R7401 Elevation of levels of liver transaminase levels: Secondary | ICD-10-CM

## 2012-02-17 DIAGNOSIS — E1142 Type 2 diabetes mellitus with diabetic polyneuropathy: Secondary | ICD-10-CM

## 2012-02-17 LAB — GLUCOSE, CAPILLARY: Glucose-Capillary: 543 mg/dL — ABNORMAL HIGH (ref 70–99)

## 2012-02-17 LAB — MICROALBUMIN / CREATININE URINE RATIO
Creatinine, Urine: 36.9 mg/dL
Microalb Creat Ratio: 631.2 mg/g — ABNORMAL HIGH (ref 0.0–30.0)
Microalb, Ur: 23.29 mg/dL — ABNORMAL HIGH (ref 0.00–1.89)

## 2012-02-17 MED ORDER — AMLODIPINE BESYLATE 2.5 MG PO TABS: 2.5000 mg | ORAL_TABLET | Freq: Every day | ORAL | Status: DC

## 2012-02-17 MED ORDER — FUROSEMIDE 80 MG PO TABS: 80.0000 mg | ORAL_TABLET | Freq: Every day | ORAL | Status: DC

## 2012-02-17 MED ORDER — INSULIN ASPART PROT & ASPART (70-30 MIX) 100 UNIT/ML ~~LOC~~ SUSP: 15.0000 [IU] | Freq: Two times a day (BID) | SUBCUTANEOUS | Status: DC

## 2012-02-17 NOTE — Progress Notes (Signed)
Pt was instructed how to use Accu-Chek meter; he was able to use meter properly.  Also instructed on how to use  Novolog pen; information given to pt.  Pt gave self 15 units of Novolog 70/30 per Dr Saralyn Pilar. Good technique. Aware he needs to see Jaynee Eagles and [facility].

## 2012-02-17 NOTE — Patient Instructions (Signed)
   Please follow-up at the clinic in 1 week , at which time we will reevaluate your diabetes - OR, please follow-up in the clinic sooner if needed.  Please follow-up with our diabetes educator, Lupita Leash Plyler in 1 week (before your visit)  There have been changes in your medications:  STOP Metformin  STOP glipizide  START Novolog 70/30 insulin - 15 units before breakfast, and 15 units before dinner. Call us if there are any problems or any questions    If you have been started on new medication(s), and you develop symptoms concerning for allergic reaction, including, but not limited to, throat closing, tongue swelling, rash, please stop the medication immediately and call the clinic at [phone], and go to the ER.  If you are diabetic, please bring your meter to your next visit.  If symptoms worsen, or new symptoms arise, please call the clinic or go to the ER.  Please bring all of your medications in a bag to your next visit.
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
## 2012-02-18 LAB — URINALYSIS
Bilirubin Urine: NEGATIVE
Glucose, UA: >1000 mg/dL — AB
Hgb urine dipstick: NEGATIVE
Ketones, ur: NEGATIVE mg/dL
Leukocytes, UA: NEGATIVE
Nitrite: NEGATIVE
Protein, ur: 30 mg/dL — AB
Specific Gravity, Urine: 1.029 (ref 1.005–1.030)
Urobilinogen, UA: 1 mg/dL (ref 0.0–1.0)
pH: 6 (ref 5.0–8.0)

## 2012-02-18 LAB — GC/CHLAMYDIA PROBE AMP, URINE
Chlamydia, Swab/Urine, PCR: NEGATIVE
GC Probe Amp, Urine: NEGATIVE

## 2012-02-18 LAB — HEPATITIS PANEL, ACUTE
HCV Ab: REACTIVE — AB
Hep A IgM: NEGATIVE
Hep B C IgM: NEGATIVE
Hepatitis B Surface Ag: NEGATIVE

## 2012-02-18 NOTE — Assessment & Plan Note (Signed)
BP Readings from Last 3 Encounters:  02/17/12 150/97  02/16/12 158/98  02/13/12 148/95    Basic Metabolic Panel:    Component Value Date/Time   NA 129* 02/16/2012 1546   K 4.1 02/16/2012 1546   CL 92* 02/16/2012 1546   CO2 25 02/16/2012 1546   BUN 17 02/16/2012 1546   CREATININE 0.92 02/16/2012 1546   CREATININE 0.99 02/13/2012 1406   GLUCOSE 611* 02/16/2012 1546   CALCIUM 9.4 02/16/2012 1546    Assessment: Status: Is not maximized on any of his medications. Has been just going to urgent care or ER for medical management since released from jail in 12/2011.  Disease Control: not controlled  Progress toward goals: unable to assess  Barriers to meeting goals: financial need and no PCP   Plan:  Continue current Amlodipine and Lasix (states he gets LE swelling with reduced doses of lasix)  Increased Lisinopril just yesterday - plan to continue this for now.  At some point, we may need to get an echo to evaluate his reported clinical CHF reported in prior hospitalizations, with consideration to reduce Lasix dosing.  Next visit, can consider to change from lisinopril and lasix to a lisinopril-hctz combination pill, likely the 20-12.5mg  2 tabs daily, and just dc his lasix, and low dose amlodipine -   There was way too much to do during todays visit with all of the diabetic education and new insulin use that was required, therefore, this could not be accomplished today.

## 2012-02-18 NOTE — Assessment & Plan Note (Signed)
Assessment: Status: Checked CMET yesterday, which shows worsening of liver function from last check in 10/2011. Unclear cause at this point, medication effect less likely as no new medications. He has a long history of recent incarceration, remote alcohol abuse and IV drug abuse (quit in 10/2011), therefore is definitely at increased risk for hepatitis, cirrhosis.  Progress toward goals: deteriorated  Barriers to meeting goals: no barriers identified - has actually stopped his substance abuse at this point, which is great.   Plan:      Check hepatitis panel. - pending at this time.  Consider liver ultrasound depending on results of hepatitis panel.  DC Metformin.

## 2012-02-18 NOTE — Assessment & Plan Note (Signed)
Labs: Lab Results  Component Value Date   HGBA1C 10.3 02/16/2012   HGBA1C 5.9 06/16/2010   CREATININE 0.92 02/16/2012   CREATININE 0.99 02/13/2012   CHOL 164    06/16/2010   HDL 68 06/16/2010   TRIG 59 06/16/2010    Last eye exam and foot exam: No results found for this basename: HMDIABEYEEXA,  HMDIABFOOTEX     Assessment: Status: LFTs from yesterday show worsening even from November 2012, therefore, metformin is likely not the best medication for him right now. He is agreeable to use insulin, and I think that will bring the best control of his blood sugars.   He is completely asymptomatic at this point in terms of NO nausea, vomiting, abdominal pain, blurriness of vision, mental status changes. There is no obvious source of infection.    Disease Control: not controlled  Progress toward goals: deteriorated  Barriers to meeting goals: financial need   Plan: Glucometer log was not reviewed today, as pt did not have glucometer available for review.      DC Metformin, DC glipizide (never started)  Start Novolog 70/30 - sample flexplen given - to inject 15 units BID (slightly less than the 0.5 units/ kg / day total being that he is insulin naive - will likely require escalation) - hopefully this will last until he sees [facility] and can get orange card  Glucometer given to patient - he was also provided teaching by Chinita Pester, RN regarding how to check blood sugars  Advised to check blood sugars 2 times daily before breakfast and before bedtime.  Advised to call if any questions at all or feeling poorly.   Will do other preventative care such as PCV and eye exam after gets orange card.  Advised to follow-up in 1 week with clinic doctor and [facility].

## 2012-02-18 NOTE — Assessment & Plan Note (Signed)
1. The patient was counseled on the dangers of tobacco use, and was advised to quit and referred to a tobacco cessation program.   2. Reviewed strategies to maximize success, including:  Removing cigarettes and smoking materials from environment  Stress management  Substitution of other forms of reinforcement Support of family/friends.  Selecting a quit date.  Number given for 1-800-QUIT-NOW

## 2012-02-18 NOTE — ED Provider Notes (Signed)
Medical screening examination/treatment/procedure(s) were conducted as a shared visit with non-physician practitioner(s) and myself.  I personally evaluated the patient during the encounter.  Raeford Razor, MD 02/18/12 [phone]

## 2012-02-19 ENCOUNTER — Encounter: Payer: Self-pay | Admitting: *Deleted

## 2012-02-19 NOTE — Progress Notes (Signed)
Thank you!  Shelly Kalia-Reynolds, D.O.  

## 2012-02-19 NOTE — Progress Notes (Signed)
Quick Note:  Talked with Dr. Aundria Rud, high pretest probability of Hep C given hx of IV heroin use, alcohol abuse, incarceration recently. Therefore, confirmatory RNA likely not needed. Pt called and informed of result, he does want to pursue treatment. Dr. Aundria Rud recommended follow-up in 1 month, reassess CMET and liver function tests, PT/INR, and reassess continued cessation from heroin, alcohol - with consideration for referral to hep C clinic at that time.  Johnette Abraham, D.O. ______

## 2012-02-19 NOTE — Progress Notes (Unsigned)
Pt presents c/o meds called in recently were too expensive for him to obtain, i called [facility] and they can do the lisinopril 20 mg, 2 daily #100 $10.00, neuroti300mg  #90 $25.15 and furosemide 80mg  #30 $10.00, pt states he can get 45.15 easier than $80.00+ at [facility], called to [facility]. Spoke w/ dr Dorothyann Gibbs.

## 2012-02-24 ENCOUNTER — Encounter: Payer: Self-pay | Admitting: Internal Medicine

## 2012-02-25 ENCOUNTER — Encounter: Payer: Self-pay | Admitting: Internal Medicine

## 2012-02-25 NOTE — Progress Notes (Signed)
HYPERTENSION PLAN: Pt does not have insurance, has no money, and does not have orange card at this point. Therefore, after talking with Myriam Jacobson, who kindly called pharmacies to see what of his medications would be most affordable, I decided to stop his Amlodipine.  Because of his multiple complex and untreated medical problems (as he has never had a consistent PCP, and has many concerns), I was not able to spend a great deal of time on his hypertension during our visit together as we were also starting him newly on insulin. Therefore, initially, he was recommended to increase his lisinopril to 40mg  daily and continue his other medications as is (including amlodipine 2.5mg  and lasix 80mg  daily) with the ultimate goal of adjusting his regimen over next few weeks. Specifically, ideally, I think we can consider to change him to Lisinopril-HCTZ combination instead of lisinopril + lasix + amlodipine at 2.5mg . This will need to be slowly accomplished over multiple visits, as this patient has so many unattended to medical problems that we need to tackle.   Therefore, pt at this point is on Lisinopril 40mg , Lasix 80mg . During follow-up visit, can consider to adjust to a Lisinopril-HCTZ combination pill (likely the 20-12.5mg  2 tabs daily) and dc the lasix. With eventual addition of CCB if needed. He will also eventually need an echocardiogram to reevaluate his "clinically diagnosed CHF".   Ongoing challenge will be financial (on first visit, stated he cannot afford any medications at all) until he can get orange card - which he is apparently in the process of doing.   Reassess BP 1st week of March with change in therapy at that time as indicated.  Johnette Abraham, D.O.

## 2012-02-27 ENCOUNTER — Ambulatory Visit (INDEPENDENT_AMBULATORY_CARE_PROVIDER_SITE_OTHER): Payer: Self-pay | Admitting: Internal Medicine

## 2012-02-27 ENCOUNTER — Encounter: Payer: Self-pay | Admitting: Internal Medicine

## 2012-02-27 VITALS — BP 114/77 | HR 99 | Temp 97.8°F | Ht 71.0 in | Wt 181.7 lb

## 2012-02-27 DIAGNOSIS — B1920 Unspecified viral hepatitis C without hepatic coma: Secondary | ICD-10-CM

## 2012-02-27 DIAGNOSIS — E1142 Type 2 diabetes mellitus with diabetic polyneuropathy: Secondary | ICD-10-CM

## 2012-02-27 DIAGNOSIS — I10 Essential (primary) hypertension: Secondary | ICD-10-CM

## 2012-02-27 DIAGNOSIS — F1911 Other psychoactive substance abuse, in remission: Secondary | ICD-10-CM

## 2012-02-27 DIAGNOSIS — F1990 Other psychoactive substance use, unspecified, uncomplicated: Secondary | ICD-10-CM

## 2012-02-27 DIAGNOSIS — E1149 Type 2 diabetes mellitus with other diabetic neurological complication: Secondary | ICD-10-CM

## 2012-02-27 LAB — COMPREHENSIVE METABOLIC PANEL
ALT: 249 U/L — ABNORMAL HIGH (ref 0–53)
AST: 175 U/L — ABNORMAL HIGH (ref 0–37)
Albumin: 3.6 g/dL (ref 3.5–5.2)
Alkaline Phosphatase: 149 U/L — ABNORMAL HIGH (ref 39–117)
BUN: 14 mg/dL (ref 6–23)
CO2: 27 mEq/L (ref 19–32)
Calcium: 9 mg/dL (ref 8.4–10.5)
Chloride: 94 mEq/L — ABNORMAL LOW (ref 96–112)
Creat: 1.29 mg/dL (ref 0.50–1.35)
Glucose, Bld: 366 mg/dL — ABNORMAL HIGH (ref 70–99)
Potassium: 3.9 mEq/L (ref 3.5–5.3)
Sodium: 133 mEq/L — ABNORMAL LOW (ref 135–145)
Total Bilirubin: 0.8 mg/dL (ref 0.3–1.2)
Total Protein: 8.3 g/dL (ref 6.0–8.3)

## 2012-02-27 LAB — GLUCOSE, CAPILLARY: Glucose-Capillary: 380 mg/dL — ABNORMAL HIGH (ref 70–99)

## 2012-02-27 MED ORDER — AMITRIPTYLINE HCL 25 MG PO TABS: 25.0000 mg | ORAL_TABLET | Freq: Every day | ORAL | Status: DC

## 2012-02-27 MED ORDER — INSULIN ASPART PROT & ASPART (70-30 MIX) 100 UNIT/ML ~~LOC~~ SUSP: 15.0000 [IU] | Freq: Two times a day (BID) | SUBCUTANEOUS | Status: DC

## 2012-02-27 NOTE — Progress Notes (Signed)
Subjective:     Patient ID: Brad [name], male   DOB: 16-May-1961, 51 y.o.   MRN: [id number]  HPI Pt is a 51 y/o M here today for a f/u appt.  DM: pt reports CBGs running as high as 500; in the 300s the past few days.  He ran out of the Insulin Pen given to him at his last OV.  He notes his vision seems blurry when his sugar is high.  Denies vision loss, eye pain, eye discharge, and headache.  Neuropathy:  He was also unable to afford gabapentin.  He is amenable to trying amitriptyline as this is a less expensive medication.    HTN: BP improved today.  He was able to obtain his BP medication prescribed at this last visit.  He just received his orange card.    Review of Systems  Constitutional: Negative for fever, chills, diaphoresis, activity change, appetite change, fatigue and unexpected weight change.  HENT: Negative for hearing loss, congestion and neck stiffness.   Eyes: Negative for photophobia, pain and visual disturbance.  Respiratory: Negative for cough, chest tightness, shortness of breath and wheezing.   Cardiovascular: Negative for chest pain and palpitations.  Gastrointestinal: Negative for abdominal pain, blood in stool and anal bleeding.  Genitourinary: Negative for dysuria, hematuria and difficulty urinating.  Musculoskeletal: Negative for joint swelling.  Neurological: Negative for dizziness, syncope, speech difficulty, weakness, numbness and headaches.     Objective:   Physical Exam Vital signs reviewed GEN: No apparent distress.  Alert and oriented x 3.  Pleasant, conversant, and cooperative to exam. HEENT: head is autraumatic and normocephalic.  Neck is supple without palpable masses or lymphadenopathy.  No JVD or carotid bruits.  Vision intact.  EOMI.  PERRLA.  Sclerae anicteric.  Conjunctivae without pallor or injection. Mucous membranes are moist.  Oropharynx is without erythema, exudates, or other abnormal lesions.   RESP:  Lungs are clear to ascultation  bilaterally with good air movement.  No wheezes, ronchi, or rubs. CARDIOVASCULAR: regular rate, normal rhythm.  Clear S1, S2, no murmurs, gallops, or rubs. ABDOMEN: soft, non-tender, non-distended.  Bowels sounds present in all quadrants and normoactive.  No palpable masses. EXT: warm and dry.  Peripheral pulses equal, intact, and +2 globally.  No clubbing or cyanosis.  No edema in bilateral lower extremities. SKIN: warm and dry with normal turgor.  No rashes or abnormal lesions observed. NEURO: CN II-XII grossly intact.  Muscle strength +5/5 in bilateral upper and lower extremities.  Sensation is grossly intact.  No focal deficit.     Assessment:

## 2012-02-27 NOTE — Patient Instructions (Signed)
Schedule a follow up appointment with Dr. Arvilla Market or Dr. Saralyn Pilar in 2 weeks Take your prescriptions to the [facility].  Be sure you have your orange card. Amitriptyline is a new medicine to help with the nerve pain in your feet. This medicine will make a sleepy. Take one pill at night before you go to bed.

## 2012-02-28 LAB — HIV ANTIBODY (ROUTINE TESTING W REFLEX): HIV: NONREACTIVE

## 2012-03-02 LAB — HEPATITIS C RNA QUANTITATIVE
HCV Quantitative Log: 7.05 {Log} — ABNORMAL HIGH (ref <1.63)
HCV Quantitative: 11158991 IU/mL — ABNORMAL HIGH (ref <43)

## 2012-03-03 NOTE — Assessment & Plan Note (Signed)
Patient has a history of IVDU.  He is currently living at the [facility] and reports abstinence for 4 months.   Will check HIV antibody as he is at increased risk for this given hx of IVDU.  Will also obtain HIV viral load and genotype testing.  He may benefit from referral to hepatology/ID for treatment of his HCV.  Would consider sending him to [facility].  Will review the criteria for treatment and follow up on this at his next office visit.

## 2012-03-03 NOTE — Assessment & Plan Note (Signed)
BP still slightly above goal but within acceptable limits.  Will check CMET today.  Unfortunately, we ran out of time to discuss continued management of his HTN.  At his f/u visit in 2 weeks, anticipate d/c of lasix and transition to HCTZ.

## 2012-03-03 NOTE — Assessment & Plan Note (Addendum)
Will provide him with another sample pen of insulin 70/30.  He now has the orange card and should be able to obtain his medications.  Will call in a prescription for insulin to the [facility].  Pt is advised to contact the clinic if he experienced any difficulties.  Will refer him for an annual diabetic eye exam today.    He was unable to afford his prescription for gabapentin to treat his diabetic peripheral neuropathy.  Will give him a prescription for  amitriptyline amitriptyline as this is indicated for the treatment of diabetic peripheral neuropathy and is significantly less expensive.  He states he would be able to afford $4 for this medication at [facility].  Amitriptyline is available at the [facility]; he may be able to obtain this medication for free or for a significantly reduced cost.  Will have him return for office visit in 2-3 weeks to assess his response to amitriptyline, evaluate for any adverse effects, and titrate dose if needed.

## 2012-03-05 ENCOUNTER — Other Ambulatory Visit: Payer: Self-pay | Admitting: Internal Medicine

## 2012-03-05 DIAGNOSIS — E119 Type 2 diabetes mellitus without complications: Secondary | ICD-10-CM

## 2012-03-05 MED ORDER — INSULIN NPH ISOPHANE & REGULAR (70-30) 100 UNIT/ML ~~LOC~~ SUSP: SUBCUTANEOUS | Status: DC

## 2012-03-09 ENCOUNTER — Encounter: Payer: Self-pay | Admitting: Dietician

## 2012-03-12 ENCOUNTER — Encounter: Payer: Self-pay | Admitting: Internal Medicine

## 2012-04-21 ENCOUNTER — Ambulatory Visit: Payer: Self-pay | Admitting: Dietician

## 2012-04-21 ENCOUNTER — Encounter: Payer: Self-pay | Admitting: Internal Medicine

## 2012-04-21 ENCOUNTER — Ambulatory Visit (INDEPENDENT_AMBULATORY_CARE_PROVIDER_SITE_OTHER): Payer: Self-pay | Admitting: Internal Medicine

## 2012-04-21 VITALS — BP 136/97 | HR 97 | Temp 97.0°F | Ht 71.0 in | Wt 173.9 lb

## 2012-04-21 DIAGNOSIS — B1920 Unspecified viral hepatitis C without hepatic coma: Secondary | ICD-10-CM | POA: Insufficient documentation

## 2012-04-21 DIAGNOSIS — E1149 Type 2 diabetes mellitus with other diabetic neurological complication: Secondary | ICD-10-CM

## 2012-04-21 DIAGNOSIS — E119 Type 2 diabetes mellitus without complications: Secondary | ICD-10-CM

## 2012-04-21 DIAGNOSIS — E1142 Type 2 diabetes mellitus with diabetic polyneuropathy: Secondary | ICD-10-CM

## 2012-04-21 DIAGNOSIS — F329 Major depressive disorder, single episode, unspecified: Secondary | ICD-10-CM

## 2012-04-21 DIAGNOSIS — I10 Essential (primary) hypertension: Secondary | ICD-10-CM

## 2012-04-21 DIAGNOSIS — R7401 Elevation of levels of liver transaminase levels: Secondary | ICD-10-CM

## 2012-04-21 DIAGNOSIS — F32A Depression, unspecified: Secondary | ICD-10-CM | POA: Insufficient documentation

## 2012-04-21 DIAGNOSIS — Z79899 Other long term (current) drug therapy: Secondary | ICD-10-CM

## 2012-04-21 DIAGNOSIS — R7402 Elevation of levels of lactic acid dehydrogenase (LDH): Secondary | ICD-10-CM

## 2012-04-21 DIAGNOSIS — R369 Urethral discharge, unspecified: Secondary | ICD-10-CM | POA: Insufficient documentation

## 2012-04-21 DIAGNOSIS — R748 Abnormal levels of other serum enzymes: Secondary | ICD-10-CM

## 2012-04-21 DIAGNOSIS — Z23 Encounter for immunization: Secondary | ICD-10-CM

## 2012-04-21 LAB — GLUCOSE, CAPILLARY
Glucose-Capillary: >600 mg/dL (ref 70–99)
Glucose-Capillary: >600 mg/dL (ref 70–99)

## 2012-04-21 LAB — HEPATIC FUNCTION PANEL
ALT: 224 U/L — ABNORMAL HIGH (ref 0–53)
AST: 209 U/L — ABNORMAL HIGH (ref 0–37)
Albumin: 3.2 g/dL — ABNORMAL LOW (ref 3.5–5.2)
Alkaline Phosphatase: 172 U/L — ABNORMAL HIGH (ref 39–117)
Bilirubin, Direct: 0.3 mg/dL (ref 0.0–0.3)
Indirect Bilirubin: 0.3 mg/dL (ref 0.0–0.9)
Total Bilirubin: 0.6 mg/dL (ref 0.3–1.2)
Total Protein: 8.2 g/dL (ref 6.0–8.3)

## 2012-04-21 LAB — BASIC METABOLIC PANEL
BUN: 14 mg/dL (ref 6–23)
CO2: 28 mEq/L (ref 19–32)
Calcium: 9.3 mg/dL (ref 8.4–10.5)
Chloride: 84 mEq/L — ABNORMAL LOW (ref 96–112)
Creat: 1.25 mg/dL (ref 0.50–1.35)
Glucose, Bld: 731 mg/dL (ref 70–99)
Potassium: 3.1 mEq/L — ABNORMAL LOW (ref 3.5–5.3)
Sodium: 123 mEq/L — ABNORMAL LOW (ref 135–145)

## 2012-04-21 LAB — POCT GLYCOSYLATED HEMOGLOBIN (HGB A1C): Hemoglobin A1C: >14

## 2012-04-21 MED ORDER — LISINOPRIL 40 MG PO TABS: 40.0000 mg | ORAL_TABLET | Freq: Every day | ORAL | Status: DC

## 2012-04-21 MED ORDER — INSULIN NPH ISOPHANE & REGULAR (70-30) 100 UNIT/ML ~~LOC~~ SUSP: SUBCUTANEOUS | Status: DC

## 2012-04-21 MED ORDER — LANCETS 30G MISC: Status: DC

## 2012-04-21 MED ORDER — AMITRIPTYLINE HCL 100 MG PO TABS: 100.0000 mg | ORAL_TABLET | Freq: Every day | ORAL | Status: DC

## 2012-04-21 MED ORDER — GLUCOSE BLOOD VI STRP: ORAL_STRIP | Status: DC

## 2012-04-21 NOTE — Assessment & Plan Note (Signed)
Blood pressure within acceptable limits today despite lack of medication for the past 3 weeks. We'll resume the patient on his lisinopril given concomitant diabetes.  Will not start Lasix as it does not seem to be a clear indication for this at this time. We'll obtain a stat metabolic panel today is given concern for possible DKA and will have him followup with his primary care provider as previously scheduled appointment on May 16.  At that time will repeat blood pressure check and obtain metabolic panel to assess electrolyte status and renal function.

## 2012-04-21 NOTE — Assessment & Plan Note (Signed)
Patient reports feelings of depression with episodes of tearfulness that have worsened over the past 1-1/2 weeks. He feels that this is the result of significant stress in his life. He believes that his symptoms were more better controlled while taking amitriptyline and mirtazapine. He states that he is unable to afford mirtazapine but was able to receive amitriptyline from the [facility]. He denies suicidal or homicidal ideation at this time. We'll send a refill of amitriptyline to the [facility] health aren't seeing an increase to 100 mg by mouth each bedtime as this is closer to do same used for management of depression. This should also help his symptoms of peripheral neuropathy as a result of his diabetes.

## 2012-04-21 NOTE — Assessment & Plan Note (Signed)
Patient reports disccolored ejaculate upon orgasm. He does this happened twice since February 2013. Has been with one new partner in the past year and states he used condoms with his new partner.  I am unsure of the etiology of his abnormal ejaculation.  It is possible this could represent sexually transmitted infection although this seems less likely given lack of penile discharge and a recent negative testing in February. Will send urine for gonorrhea and chlamydia today. He is recently tested negative for HIV. His  symptoms are not suggestive of MS per and that can be seen with tuberculosis as he is not describing bright red blood with ejaculation and reports a recent negative PPD.  Will obtain urinalysis today as well to evaluate for hematuria or other e evidence suggestive of prostatitis.  Perhaps a traumatic injury sustained during intercourse as resulted in his discolored ejaculate. We'll continue to monitor closely

## 2012-04-21 NOTE — Assessment & Plan Note (Signed)
The patient is actively infected with hepatitis C with evidence of possible liver dysfunction suggested by elevated transaminase levels.  Review of his chart reveals he has not received vaccination for hepatitis A or B. based on results of prior hepatitis testing. We'll administer first vaccine today for both hepatitis A and hepatitis B vaccine series. Patient is informed he will need to return to complete his second hepatitis A vaccination as well as second and third hepatitis B vaccinations.   Will repeat liver function testing today and obtain abdominal ultrasound to evaluate for evidence of cirrhosis or other liver abnormality. We'll also refer him to hepatitis C clinic to be evaluated for potential treatment.  Patient states he remains abstinent from IV drug use as well as alcohol use. Will repeat LFTs today.

## 2012-04-21 NOTE — Progress Notes (Signed)
Patient needs/wants meter, supplies and syringes. Discussed all these can be obtained at [facility] for 6$ each. Patient says he does not have money to purchase. Gave him box of 100 lancets and 2 bags of 10 syringes. Asked him to make an appointment with CDE when he is feeling better. Called in meter, strips and lancets to [facility].

## 2012-04-21 NOTE — Progress Notes (Signed)
Patient ID: Brad [name], male   DOB: 04-Mar-1961, 51 y.o.   MRN: [id number] Subjective:     HPI Pt is a 51 y/o M here today for medication refill.  He did not show at his last 2 week f/u appt in March.   He is out of all medications and has been for the past 3 weeks.  He recently started working [place] [employer].  DM: last A1c 10.3 in 01/2012.  Pt last checked his CBG before his move 3-4 weeks ago and states his CBGs were still in the 500s.   He ran out of the Insulin Pen given to him at his last OV.  He notes his vision seems blurry when his sugar is high.  Denies vision loss, eye pain, eye discharge, and headache.  Apparently, he lost his meter during a recent move.  States he will be able to obtain the insulin from health dept.    Neuropathy:  He was also unable to afford gabapentin.  He is amenable to trying amitriptyline as this is a less expensive medication.    Depression: pt reports periods of crying.  Was previously taking amitriptyline but has also been out of this for 53mo.  He reports difficulty sleeping for the past 1.5 weeks as a result of increased stress.  Denies suicidal and homicidal ideation.  Penile discharge:  Pt reports brown-colored ejaculate. He notes the first episdoe occurred  in feb 2013 after intercourse with a new partner (states he used a condom).  It has only happened one time since in April..  Denies any fevers or chills.   Denies bright red blood with ejaculation and denies any other penile discharge.  Denies dysuria and hematuria.  He reports negative ppd testing in the past (most recently while in prison in January 2013).    HTN: BP at goal today.  At his last OV, patient stated he received his orange card however there is no documentation of this in EPIC.  Patient continues to describe significant difficulty affording medications  Review of Systems  Constitutional: Negative for fever, chills, diaphoresis, activity change, appetite change, fatigue and  unexpected weight change.  HENT: Negative for hearing loss, congestion and neck stiffness.   Eyes: Negative for photophobia, pain and visual disturbance.  Respiratory: Negative for cough, chest tightness, shortness of breath and wheezing.   Cardiovascular: Negative for chest pain and palpitations.  Gastrointestinal: Negative for abdominal pain, blood in stool and anal bleeding.  Genitourinary: Negative for dysuria, hematuria and difficulty urinating.  Musculoskeletal: Negative for joint swelling.  Neurological: Negative for dizziness, syncope, speech difficulty, weakness, numbness and headaches.     Objective:   Physical Exam Vital signs reviewed GEN: No apparent distress.  Alert and oriented x 3.  Pleasant, conversant, and cooperative to exam. HEENT: head is autraumatic and normocephalic.  Neck is supple without palpable masses or lymphadenopathy.  No JVD or carotid bruits.  Vision intact.  EOMI.  PERRLA.  Sclerae anicteric.  Conjunctivae without pallor or injection. Mucous membranes are moist.  Oropharynx is without erythema, exudates, or other abnormal lesions.   RESP:  Lungs are clear to ascultation bilaterally with good air movement.  No wheezes, ronchi, or rubs. CARDIOVASCULAR: regular rate, normal rhythm.  Clear S1, S2, no murmurs, gallops, or rubs. ABDOMEN: soft, non-tender, non-distended.  Bowels sounds present in all quadrants and normoactive.  No palpable masses. EXT: warm and dry.  Peripheral pulses equal, intact, and +2 globally.  No clubbing  or cyanosis.  No edema in bilateral lower extremities. SKIN: warm and dry with normal turgor.  No rashes or abnormal lesions observed. NEURO: CN II-XII grossly intact.  Muscle strength +5/5 in bilateral upper and lower extremities.  Sensation is grossly intact.  No focal deficit.     Assessment:

## 2012-04-21 NOTE — Patient Instructions (Addendum)
You have an appointment with your primary care doctor, Dr. Saralyn Pilar on Thursday, March 16.  IT IS VERY IMPORTANT TO MAKE THIS APPOINTMENT.  Keep taking all of your medicine as directed. Your prescriptions for amitriptyline, insulin, and her blood pressure pill was sent to the [facility]. The pharmacy will have syringe is available for you when you pickup your insulin. Amitriptyline is a medicine to help your depression, help your difficulty sleeping, and help your nerve pain. Take one pill every night. For now, lisinopril is your own name medicine for blood pressure. Take one tablet every day. We will need to get lab work from you at your next office visit. You will need to come back for your second hepatitis B vaccine as well as second and third hepatitis B vaccinations. We will set you up for an abdominal ultrasound to take a look at your liver.  The date of your ultrasound is Monday, May 6. You need to be [facility] by 7:45 AM at radiologyDo not have anything to eat or drink after midnight the night before the ultrasound. We will refer you to a liver specialist for further management and discussion about possible treatment for your hepatitis C infection.

## 2012-04-22 LAB — URINALYSIS, ROUTINE W REFLEX MICROSCOPIC
Bilirubin Urine: NEGATIVE
Glucose, UA: >1000 mg/dL — AB
Hgb urine dipstick: NEGATIVE
Ketones, ur: NEGATIVE mg/dL
Leukocytes, UA: NEGATIVE
Nitrite: NEGATIVE
Protein, ur: 100 mg/dL — AB
Specific Gravity, Urine: >1.03 — ABNORMAL HIGH (ref 1.005–1.030)
Urobilinogen, UA: 0.2 mg/dL (ref 0.0–1.0)
pH: 6 (ref 5.0–8.0)

## 2012-04-22 LAB — URINALYSIS, MICROSCOPIC ONLY
Bacteria, UA: NONE SEEN
Casts: NONE SEEN
Crystals: NONE SEEN
Squamous Epithelial / LPF: NONE SEEN

## 2012-04-22 NOTE — Assessment & Plan Note (Signed)
I am unsure of the etiology of patient's report of brown-colored ejaculate.  This may represent socially transmitted infections (i.e. Chlamydia/gonorrhea) however this seems less likely given lack of persistent penile discharge.  He is also not reporting bright red hematospermia that would be suggestive of TB and reports recently negative ppd while in prison (12/2011).  , Sustained it during intercourse seems possible.  Will check urine for gonorrhea and chlamydia today. We'll also obtain urinalysis to evaluate for the presence of blood in urine.  If these results are unrevealing and the patient continues to report discolored ejaculate, consider further workup with repeat Tb testing and possible referral to urology.

## 2012-04-22 NOTE — Assessment & Plan Note (Addendum)
Patient's capillary blood glucose is markedly elevated today at greater than 700 on stat basic metabolic panel.  He has a normal anion gap of 11 and therefore is not currently experiencing diabetic ketoacidosis. His CBGs are elevated as a result of lack of insulin for the past 3+ weeks.  Had a long discussion about the importance of improving blood glucose control. Patient states she will be able to afford vials of insulin at the [facility] Department. Patient is instructed to contact the clinic earlier should he run out of insulin and not wait weeks to speak with someone about lack of medications.   Will increase his insulin to 20 units twice a day.  Patient is concerned about his persistently elevated blood glucose. Informed patient that this is something we will work to improve with time and that we need to avoid rapid escalation of his insulin dose to avoid dangerous potentially fatal hypoglycemia. Patient states he understands this and is agreement with plan. He has a followup appointment scheduled with his primary care provider on May 16. Patient was strongly encouraged to come to this appointment to assess his response to insulin and to assess for further titration of his insulin dose. Patient states he agrees with this plan.  > with at least 50% face-to-face time spent counseling patient on his chronic medical issues and acute medical complaints, establishing plan of continue care as well as coordinating care with other healthcare providers.

## 2012-04-22 NOTE — Assessment & Plan Note (Signed)
Abnormal liver enzymes likely the result of his chronic hepatitis C infection. We'll repeat liver function tests to

## 2012-04-26 ENCOUNTER — Other Ambulatory Visit: Payer: Self-pay | Admitting: Internal Medicine

## 2012-04-26 ENCOUNTER — Ambulatory Visit (HOSPITAL_COMMUNITY)
Admission: RE | Admit: 2012-04-26 | Discharge: 2012-04-26 | Disposition: A | Discharge status: Home / Self Care | Payer: Self-pay | Source: Ambulatory Visit | Attending: Internal Medicine | Admitting: Internal Medicine

## 2012-04-26 ENCOUNTER — Encounter (HOSPITAL_COMMUNITY): Payer: Self-pay | Admitting: Emergency Medicine

## 2012-04-26 ENCOUNTER — Observation Stay (HOSPITAL_COMMUNITY)
Admission: EM | Admit: 2012-04-26 | Discharge: 2012-04-26 | Disposition: A | Discharge status: Home / Self Care | Payer: Self-pay | Source: Ambulatory Visit | Attending: Emergency Medicine | Admitting: Emergency Medicine

## 2012-04-26 DIAGNOSIS — F17200 Nicotine dependence, unspecified, uncomplicated: Secondary | ICD-10-CM | POA: Insufficient documentation

## 2012-04-26 DIAGNOSIS — R739 Hyperglycemia, unspecified: Secondary | ICD-10-CM

## 2012-04-26 DIAGNOSIS — E119 Type 2 diabetes mellitus without complications: Principal | ICD-10-CM | POA: Insufficient documentation

## 2012-04-26 DIAGNOSIS — B1920 Unspecified viral hepatitis C without hepatic coma: Secondary | ICD-10-CM

## 2012-04-26 DIAGNOSIS — R5383 Other fatigue: Secondary | ICD-10-CM | POA: Insufficient documentation

## 2012-04-26 DIAGNOSIS — K219 Gastro-esophageal reflux disease without esophagitis: Secondary | ICD-10-CM | POA: Insufficient documentation

## 2012-04-26 DIAGNOSIS — I10 Essential (primary) hypertension: Secondary | ICD-10-CM | POA: Insufficient documentation

## 2012-04-26 DIAGNOSIS — R5381 Other malaise: Secondary | ICD-10-CM | POA: Insufficient documentation

## 2012-04-26 DIAGNOSIS — N281 Cyst of kidney, acquired: Secondary | ICD-10-CM | POA: Insufficient documentation

## 2012-04-26 LAB — URINALYSIS, ROUTINE W REFLEX MICROSCOPIC
Bilirubin Urine: NEGATIVE
Glucose, UA: >1000 mg/dL — AB
Hgb urine dipstick: NEGATIVE
Ketones, ur: NEGATIVE mg/dL
Leukocytes, UA: NEGATIVE
Nitrite: NEGATIVE
Protein, ur: 100 mg/dL — AB
Specific Gravity, Urine: 1.031 — ABNORMAL HIGH (ref 1.005–1.030)
Urobilinogen, UA: 1 mg/dL (ref 0.0–1.0)
pH: 6.5 (ref 5.0–8.0)

## 2012-04-26 LAB — DIFFERENTIAL
Basophils Absolute: 0 10*3/uL (ref 0.0–0.1)
Basophils Relative: 1 % (ref 0–1)
Eosinophils Absolute: 0.1 10*3/uL (ref 0.0–0.7)
Eosinophils Relative: 3 % (ref 0–5)
Lymphocytes Relative: 30 % (ref 12–46)
Lymphs Abs: 1.4 10*3/uL (ref 0.7–4.0)
Monocytes Absolute: 0.6 10*3/uL (ref 0.1–1.0)
Monocytes Relative: 13 % — ABNORMAL HIGH (ref 3–12)
Neutro Abs: 2.5 10*3/uL (ref 1.7–7.7)
Neutrophils Relative %: 54 % (ref 43–77)

## 2012-04-26 LAB — GLUCOSE, CAPILLARY
Glucose-Capillary: >600 mg/dL (ref 70–99)
Glucose-Capillary: 460 mg/dL — ABNORMAL HIGH (ref 70–99)
Glucose-Capillary: 531 mg/dL — ABNORMAL HIGH (ref 70–99)
Glucose-Capillary: 343 mg/dL — ABNORMAL HIGH (ref 70–99)
Glucose-Capillary: 387 mg/dL — ABNORMAL HIGH (ref 70–99)
Glucose-Capillary: 239 mg/dL — ABNORMAL HIGH (ref 70–99)

## 2012-04-26 LAB — URINE MICROSCOPIC-ADD ON

## 2012-04-26 LAB — BASIC METABOLIC PANEL
BUN: 12 mg/dL (ref 6–23)
CO2: 26 mEq/L (ref 19–32)
Calcium: 9.1 mg/dL (ref 8.4–10.5)
Chloride: 92 mEq/L — ABNORMAL LOW (ref 96–112)
Creatinine, Ser: 0.88 mg/dL (ref 0.50–1.35)
GFR calc Af Amer: >90 mL/min (ref >90)
GFR calc non Af Amer: >90 mL/min (ref >90)
Glucose, Bld: 619 mg/dL (ref 70–99)
Potassium: 3.4 mEq/L — ABNORMAL LOW (ref 3.5–5.1)
Sodium: 131 mEq/L — ABNORMAL LOW (ref 135–145)

## 2012-04-26 LAB — CBC
HCT: 40.3 % (ref 39.0–52.0)
Hemoglobin: 14.8 g/dL (ref 13.0–17.0)
MCH: 34 pg (ref 26.0–34.0)
MCHC: 36.7 g/dL — ABNORMAL HIGH (ref 30.0–36.0)
MCV: 92.6 fL (ref 78.0–100.0)
Platelets: 100 10*3/uL — ABNORMAL LOW (ref 150–400)
RBC: 4.35 MIL/uL (ref 4.22–5.81)
RDW: 11.6 % (ref 11.5–15.5)
WBC: 4.7 10*3/uL (ref 4.0–10.5)

## 2012-04-26 MED ORDER — SODIUM CHLORIDE 0.9 % IV SOLN
INTRAVENOUS | Status: DC
  Administered 2012-04-26: 12:00:00 via INTRAVENOUS
  Filled 2012-04-26: qty 1

## 2012-04-26 MED ORDER — INSULIN NPH ISOPHANE & REGULAR (70-30) 100 UNIT/ML ~~LOC~~ SUSP: SUBCUTANEOUS | Status: DC

## 2012-04-26 MED ORDER — ONDANSETRON HCL 4 MG/2ML IJ SOLN: 4.0000 mg | Freq: Three times a day (TID) | INTRAMUSCULAR | Status: DC | PRN

## 2012-04-26 MED ORDER — SODIUM CHLORIDE 0.9 % IV SOLN
1000.0000 mL | INTRAVENOUS | Status: DC
  Administered 2012-04-26: 1000 mL via INTRAVENOUS

## 2012-04-26 MED ORDER — LISINOPRIL 40 MG PO TABS
40.0000 mg | ORAL_TABLET | ORAL | Status: AC
  Administered 2012-04-26: 40 mg via ORAL
  Filled 2012-04-26: qty 1

## 2012-04-26 MED ORDER — HYDROCODONE-ACETAMINOPHEN 5-325 MG PO TABS
1.0000 | ORAL_TABLET | Freq: Once | ORAL | Status: AC
  Administered 2012-04-26: 1 via ORAL
  Filled 2012-04-26: qty 1

## 2012-04-26 MED ORDER — SODIUM CHLORIDE 0.9 % IV SOLN
1000.0000 mL | Freq: Once | INTRAVENOUS | Status: AC
  Administered 2012-04-26: 1000 mL via INTRAVENOUS

## 2012-04-26 MED ORDER — ZOLPIDEM TARTRATE 5 MG PO TABS: 5.0000 mg | ORAL_TABLET | Freq: Every evening | ORAL | Status: DC | PRN

## 2012-04-26 MED ORDER — SODIUM CHLORIDE 0.9 % IV BOLUS (SEPSIS)
1000.0000 mL | Freq: Once | INTRAVENOUS | Status: AC
  Administered 2012-04-26: 1000 mL via INTRAVENOUS

## 2012-04-26 MED ORDER — GI COCKTAIL ~~LOC~~: 30.0000 mL | Freq: Two times a day (BID) | ORAL | Status: DC | PRN

## 2012-04-26 NOTE — ED Notes (Signed)
CBG 537, notified RN

## 2012-04-26 NOTE — ED Notes (Signed)
Has been feeling weak  Went to clinic  Last wed and was told his sugar was high but no one ever called him back he astates

## 2012-04-26 NOTE — ED Notes (Signed)
Pt c/o generalized weakness and intermittent visual changes. States he started taking insulin this past wednesday after not having any for 2 months. Pt states he followed up with health department, who told him his blood sugar was "hi" states he takes at home as well and has had the same reading since Wednesday. Pt A&OX3 at this time. Denies further needs
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
## 2012-04-26 NOTE — ED Notes (Signed)
CBG 460, RN notified

## 2012-04-26 NOTE — ED Notes (Signed)
Report received, assumed care.  

## 2012-04-26 NOTE — Discharge Instructions (Signed)
Hyperglycemia Hyperglycemia occurs when the glucose (sugar) in your blood is too high. Hyperglycemia can happen for many reasons, but it most often happens to people who do not know they have diabetes or are not managing their diabetes properly.  CAUSES  Whether you have diabetes or not, there are other causes of hyperglycemia. Hyperglycemia can occur when you have diabetes, but it can also occur in other situations that you might not be as aware of, such as: Diabetes  If you have diabetes and are having problems controlling your blood glucose, hyperglycemia could occur because of some of the following reasons:   Not following your meal plan.   Not taking your diabetes medications or not taking it properly.   Exercising less or doing less activity than you normally do.   Being sick.  Pre-diabetes  This cannot be ignored. Before people develop Type 2 diabetes, they almost always have "pre-diabetes." This is when your blood glucose levels are higher than normal, but not yet high enough to be diagnosed as diabetes. Research has shown that some long-term damage to the body, especially the heart and circulatory system, may already be occurring during pre-diabetes. If you take action to manage your blood glucose when you have pre-diabetes, you may delay or prevent Type 2 diabetes from developing.  Stress  If you have diabetes, you may be "diet" controlled or on oral medications or insulin to control your diabetes. However, you may find that your blood glucose is higher than usual in the hospital whether you have diabetes or not. This is often referred to as "stress hyperglycemia." Stress can elevate your blood glucose. This happens because of hormones put out by the body during times of stress. If stress has been the cause of your high blood glucose, it can be followed regularly by your caregiver. That way he/she can make sure your hyperglycemia does not continue to get worse or progress to diabetes.    Steroids  Steroids are medications that act on the infection fighting system (immune system) to block inflammation or infection. One side effect can be a rise in blood glucose. Most people can produce enough extra insulin to allow for this rise, but for those who cannot, steroids make blood glucose levels go even higher. It is not unusual for steroid treatments to "uncover" diabetes that is developing. It is not always possible to determine if the hyperglycemia will go away after the steroids are stopped. A special blood test called an A1c is sometimes done to determine if your blood glucose was elevated before the steroids were started.  SYMPTOMS  Thirsty.   Frequent urination.   Dry mouth.   Blurred vision.   Tired or fatigue.   Weakness.   Sleepy.   Tingling in feet or leg.  DIAGNOSIS  Diagnosis is made by monitoring blood glucose in one or all of the following ways:  A1c test. This is a chemical found in your blood.   Fingerstick blood glucose monitoring.   Laboratory results.  TREATMENT  First, knowing the cause of the hyperglycemia is important before the hyperglycemia can be treated. Treatment may include, but is not be limited to:  Education.   Change or adjustment in medications.   Change or adjustment in meal plan.   Treatment for an illness, infection, etc.   More frequent blood glucose monitoring.   Change in exercise plan.   Decreasing or stopping steroids.   Lifestyle changes.  HOME CARE INSTRUCTIONS   Test your blood glucose   as directed.   Exercise regularly. Your caregiver will give you instructions about exercise. Pre-diabetes or diabetes which comes on with stress is helped by exercising.   Eat wholesome, balanced meals. Eat often and at regular, fixed times. Your caregiver or nutritionist will give you a meal plan to guide your sugar intake.   Being at an ideal weight is important. If needed, losing as little as 10 to 15 pounds may help  improve blood glucose levels.  SEEK MEDICAL CARE IF:   You have questions about medicine, activity, or diet.   You continue to have symptoms (problems such as increased thirst, urination, or weight gain).  SEEK IMMEDIATE MEDICAL CARE IF:   You are vomiting or have diarrhea.   Your breath smells fruity.   You are breathing faster or slower.   You are very sleepy or incoherent.   You have numbness, tingling, or pain in your feet or hands.   You have chest pain.   Your symptoms get worse even though you have been following your caregiver's orders.   If you have any other questions or concerns.  Document Released: 06/03/2001 Document Revised: 11/27/2011 Document Reviewed: 07/30/2009 ExitCare Patient Information 2012 [employer].Diabetes, Frequently Asked Questions WHAT IS DIABETES? Most of the food we eat is turned into glucose (sugar). Our bodies use it for energy. The pancreas makes a hormone called insulin. It helps glucose get into the cells of our bodies. When you have diabetes, your body either does not make enough insulin or cannot use its own insulin as well as it should. This causes sugars to build up in your blood. WHAT ARE THE SYMPTOMS OF DIABETES?  Frequent urination.   Excessive thirst.   Unexplained weight loss.   Extreme hunger.   Blurred vision.   Tingling or numbness in hands or feet.   Feeling very tired much of the time.   Dry, itchy skin.   Sores that are slow to heal.   Yeast infections.  WHAT ARE THE TYPES OF DIABETES? Type 1 Diabetes   About 10% of affected people have this type.   Usually occurs before the age of 30.   Usually occurs in thin to normal weight people.  Type 2 Diabetes  About 90% of affected people have this type.   Usually occurs after the age of 40.   Usually occurs in overweight people.   More likely to have:   A family history of diabetes.   A history of diabetes during pregnancy (gestational diabetes).    High blood pressure.   High cholesterol and triglycerides.  Gestational Diabetes  Occurs in about 4% of pregnancies.   Usually goes away after the baby is born.   More likely to occur in women with:   Family history of diabetes.   Previous gestational diabetes.   Obese.   Over 25 years old.  WHAT IS PRE-DIABETES? Pre-diabetes means your blood glucose is higher than normal, but lower than the diabetes range. It also means you are at risk of getting type 2 diabetes and heart disease. If you are told you have pre-diabetes, have your blood glucose checked again in 1 to 2 years. WHAT IS THE TREATMENT FOR DIABETES? Treatment is aimed at keeping blood glucose near normal levels at all times. Learning how to manage this yourself is important in treating diabetes. Depending on the type of diabetes you have, your treatment will include one or more of the following:  Monitoring your blood glucose.   Meal   planning.   Exercise.   Oral medicine (pills) or insulin.  CAN DIABETES BE PREVENTED? With type 1 diabetes, prevention is more difficult, because the triggers that cause it are not yet known. With type 2 diabetes, prevention is more likely, with lifestyle changes:  Maintain a healthy weight.   Eat healthy.   Exercise.  IS THERE A CURE FOR DIABETES? No, there is no cure for diabetes. There is a lot of research going on that is looking for a cure, and progress is being made. Diabetes can be treated and controlled. People with diabetes can manage their diabetes and lead normal, active lives. SHOULD I BE TESTED FOR DIABETES? If you are at least 53 years old, you should be tested for diabetes. You should be tested again every 3 years. If you are 45 or older and overweight, you may want to get tested more often. If you are younger than 45, overweight, and have one or more of the following risk factors, you should be tested:  Family history of diabetes.   Inactive lifestyle.   High  blood pressure.  WHAT ARE SOME OTHER SOURCES FOR INFORMATION ON DIABETES? The following organizations may help in your search for more information on diabetes: National Diabetes Education Program (NDEP) Internet: http://www.ndep.nih.gov/resources American Diabetes Association Internet: http://www.diabetes.org  Juvenile Diabetes Foundation International Internet: http://www.jdf.org Document Released: 12/11/2003 Document Revised: 11/27/2011 Document Reviewed: 10/05/2009 ExitCare Patient Information 2012 [employer]. 

## 2012-04-26 NOTE — Progress Notes (Signed)
Met with patient to discuss why he was not able to obtain his medications. Patient states he gets them free from the health department once [facility] faxes them the scripts. But since he was homeless he could not afford to get to the health dept to pick up his free meds. I attempted to contact [facility] but office was closed for the day.

## 2012-04-26 NOTE — ED Provider Notes (Signed)
Pt to CDU on hyperglycemia protocol, seen initially by Dr Radford Pax, from whom I received report. Pt with c/o persistent elevated blood sugar. [facility] saw pt in ED and will provide close follow-up this week. He has been instructed to increase his insulin dosage to 30 units BID. Plan has been discussed with patient, who voices understanding. His blood sugar is now 239 and I feel he can be safely discharged. Pt voices understanding of plan.  Shaaron Adler, [place] 04/26/12 1458

## 2012-04-28 ENCOUNTER — Telehealth: Payer: Self-pay | Admitting: *Deleted

## 2012-04-28 NOTE — Telephone Encounter (Signed)
Brad [name] stopped into my office Monday afternoon. He was seen in [facility] 5.2.13.  He told me his sugars were over 600 and he just came from the ED 5.6.13.  He didn't have medicine.  He also stated he is having problems with diabetic neuropathy and blood in his semen. The EDMD told him he could not wait until 5.16.13 to see his pcp.  He was very concerned because he is trying to take care of himself.  I listened to his complaints and told him I would f/u.  After review, Dr. Damita Dunnings doesn't have an apt. sooner so one was scheduled for 5.9.13.  I called on the number he gave me and it was wrong.  I also left a message on another number. If he doesn't call me I will remove the apt. And keep the 5.16.13 scheduled.

## 2012-04-29 ENCOUNTER — Ambulatory Visit: Payer: Self-pay | Admitting: Internal Medicine

## 2012-05-06 ENCOUNTER — Encounter: Payer: Self-pay | Admitting: Internal Medicine

## 2012-05-11 ENCOUNTER — Other Ambulatory Visit: Payer: Self-pay | Admitting: Dietician

## 2012-05-11 DIAGNOSIS — E1149 Type 2 diabetes mellitus with other diabetic neurological complication: Secondary | ICD-10-CM

## 2012-05-11 MED ORDER — "INSULIN SYRINGE-NEEDLE U-100 31G X 5/16"" 0.3 ML MISC": 1.0000 | Freq: Two times a day (BID) | Status: DC

## 2012-05-11 NOTE — Telephone Encounter (Signed)
These diabetes supplies were called in to [facility] for this patient.

## 2012-05-13 MED ORDER — LANCETS 30G MISC: Status: DC

## 2012-05-13 MED ORDER — GLUCOSE BLOOD VI STRP: ORAL_STRIP | Status: DC

## 2012-05-13 MED ORDER — "INSULIN SYRINGE-NEEDLE U-100 31G X 5/16"" 0.3 ML MISC": 1.0000 | Freq: Two times a day (BID) | Status: DC

## 2012-09-02 ENCOUNTER — Ambulatory Visit: Payer: Self-pay | Admitting: Gastroenterology

## 2012-09-06 NOTE — Addendum Note (Signed)
Addended by: Neomia Dear on: 09/06/2012 06:41 PM   Modules accepted: Orders

## 2012-10-04 ENCOUNTER — Encounter (HOSPITAL_COMMUNITY): Payer: Self-pay | Admitting: Emergency Medicine

## 2012-10-04 ENCOUNTER — Emergency Department (HOSPITAL_COMMUNITY)
Admission: EM | Admit: 2012-10-04 | Discharge: 2012-10-04 | Disposition: A | Discharge status: Home / Self Care | Payer: Self-pay | Attending: Emergency Medicine | Admitting: Emergency Medicine

## 2012-10-04 DIAGNOSIS — G609 Hereditary and idiopathic neuropathy, unspecified: Secondary | ICD-10-CM | POA: Insufficient documentation

## 2012-10-04 DIAGNOSIS — Z794 Long term (current) use of insulin: Secondary | ICD-10-CM | POA: Insufficient documentation

## 2012-10-04 DIAGNOSIS — K219 Gastro-esophageal reflux disease without esophagitis: Secondary | ICD-10-CM | POA: Insufficient documentation

## 2012-10-04 DIAGNOSIS — E119 Type 2 diabetes mellitus without complications: Secondary | ICD-10-CM | POA: Insufficient documentation

## 2012-10-04 DIAGNOSIS — Z79899 Other long term (current) drug therapy: Secondary | ICD-10-CM | POA: Insufficient documentation

## 2012-10-04 DIAGNOSIS — I509 Heart failure, unspecified: Secondary | ICD-10-CM | POA: Insufficient documentation

## 2012-10-04 DIAGNOSIS — G629 Polyneuropathy, unspecified: Secondary | ICD-10-CM

## 2012-10-04 DIAGNOSIS — I10 Essential (primary) hypertension: Secondary | ICD-10-CM | POA: Insufficient documentation

## 2012-10-04 HISTORY — DX: Unspecified viral hepatitis C without hepatic coma: B19.20

## 2012-10-04 LAB — POCT I-STAT, CHEM 8
BUN: 12 mg/dL (ref 6–23)
Calcium, Ion: 1.15 mmol/L (ref 1.12–1.23)
Chloride: 97 mEq/L (ref 96–112)
Creatinine, Ser: 1.3 mg/dL (ref 0.50–1.35)
Glucose, Bld: 375 mg/dL — ABNORMAL HIGH (ref 70–99)
HCT: 46 % (ref 39.0–52.0)
Hemoglobin: 15.6 g/dL (ref 13.0–17.0)
Potassium: 3.7 mEq/L (ref 3.5–5.1)
Sodium: 138 mEq/L (ref 135–145)
TCO2: 26 mmol/L (ref 0–100)

## 2012-10-04 LAB — GLUCOSE, CAPILLARY
Glucose-Capillary: 374 mg/dL — ABNORMAL HIGH (ref 70–99)
Glucose-Capillary: 301 mg/dL — ABNORMAL HIGH (ref 70–99)

## 2012-10-04 MED ORDER — AMITRIPTYLINE HCL 100 MG PO TABS: 100.0000 mg | ORAL_TABLET | Freq: Every day | ORAL | Status: DC

## 2012-10-04 MED ORDER — FUROSEMIDE 40 MG PO TABS: 40.0000 mg | ORAL_TABLET | Freq: Every day | ORAL | Status: DC

## 2012-10-04 MED ORDER — INSULIN NPH ISOPHANE & REGULAR (70-30) 100 UNIT/ML ~~LOC~~ SUSP: 30.0000 [IU] | Freq: Two times a day (BID) | SUBCUTANEOUS | Status: DC

## 2012-10-04 MED ORDER — SODIUM CHLORIDE 0.9 % IV BOLUS (SEPSIS)
1000.0000 mL | Freq: Once | INTRAVENOUS | Status: AC
  Administered 2012-10-04: 1000 mL via INTRAVENOUS

## 2012-10-04 MED ORDER — LISINOPRIL 40 MG PO TABS: 40.0000 mg | ORAL_TABLET | Freq: Every day | ORAL | Status: DC

## 2012-10-04 MED ORDER — LISINOPRIL 20 MG PO TABS
40.0000 mg | ORAL_TABLET | Freq: Once | ORAL | Status: AC
  Administered 2012-10-04: 40 mg via ORAL
  Filled 2012-10-04: qty 2

## 2012-10-04 MED ORDER — GABAPENTIN 300 MG PO CAPS: 300.0000 mg | ORAL_CAPSULE | Freq: Two times a day (BID) | ORAL | Status: DC
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
## 2012-10-04 NOTE — ED Notes (Addendum)
Per EMS: pt from home c/o fall this am; pt c/o bilateral foot pain from neuropathy; pt sts out of meds x 2 weeks

## 2012-10-04 NOTE — ED Notes (Signed)
Emily, PA at the bedside . 

## 2012-10-04 NOTE — ED Notes (Signed)
Phlebotomy at the bedside  
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
## 2012-10-05 ENCOUNTER — Encounter (HOSPITAL_COMMUNITY): Payer: Self-pay | Admitting: Internal Medicine

## 2012-10-05 ENCOUNTER — Other Ambulatory Visit: Payer: Self-pay | Admitting: *Deleted

## 2012-10-05 NOTE — ED Provider Notes (Signed)
Medical screening examination/treatment/procedure(s) were performed by non-physician practitioner and as supervising physician I was immediately available for consultation/collaboration.  Flint Melter, MD 10/05/12 [phone]

## 2012-10-05 NOTE — Telephone Encounter (Signed)
Trying to figure out what pt needs

## 2012-10-08 ENCOUNTER — Ambulatory Visit: Payer: Self-pay | Admitting: Internal Medicine

## 2012-10-12 ENCOUNTER — Ambulatory Visit: Payer: Self-pay | Admitting: Internal Medicine

## 2012-10-18 ENCOUNTER — Ambulatory Visit: Payer: Self-pay | Admitting: Internal Medicine

## 2012-11-05 ENCOUNTER — Telehealth: Payer: Self-pay | Admitting: *Deleted

## 2012-11-05 MED ORDER — FUROSEMIDE 40 MG PO TABS: 40.0000 mg | ORAL_TABLET | Freq: Every day | ORAL | Status: DC

## 2012-11-05 MED ORDER — AMITRIPTYLINE HCL 25 MG PO TABS: 25.0000 mg | ORAL_TABLET | Freq: Every day | ORAL | Status: DC

## 2012-11-05 MED ORDER — LISINOPRIL 40 MG PO TABS: 40.0000 mg | ORAL_TABLET | Freq: Every day | ORAL | Status: DC

## 2012-11-05 MED ORDER — AMITRIPTYLINE HCL 100 MG PO TABS: 100.0000 mg | ORAL_TABLET | Freq: Every day | ORAL | Status: DC

## 2012-11-05 NOTE — Addendum Note (Signed)
Addended by: Priscella Mann on: 11/05/2012 11:40 AM   Modules accepted: Orders

## 2012-11-05 NOTE — Telephone Encounter (Signed)
Fax from [facility] states pt has been breaking Amiriptyline 100mg  tabs into 1/4's - really only wants 25mg  tablets. Called pt - recorder states " person is unavailable".

## 2012-11-05 NOTE — Telephone Encounter (Signed)
Please inform patient I am only filling 1 month supply, he needs to come in for an appointment for additional refills.   I am in clinic next month. If he is going to be scheduled with me, he MUST be scheduled either at the 3:45PM time slot or for an extended time appointment, because all of his appointments have been taking > 1 hour secondary to his very poor control of all chronic medical issues.  Also, please phone in his RX'es Thank you.  Thank you! - Johnette Abraham, D.O., 11/05/2012, 11:39 AM

## 2012-11-05 NOTE — Telephone Encounter (Signed)
Refill request forms faxed to [facility]. Also made awared pt needs to call and schedule an appt. Message sent to front desk to sched pt an appt.

## 2012-11-16 ENCOUNTER — Encounter: Payer: Self-pay | Admitting: Internal Medicine

## 2012-11-17 IMAGING — MR MR LUMBAR SPINE WO/W CM
4 of 8 series · 19 of 48 positions shown · IV contrast (multihance)
Comparison: None.

CLINICAL DATA: Weakness and numbness.  Bilateral foot numbness.
Epidural abscess.  History of IV drug abuse.

MRI LUMBAR SPINE WITHOUT AND WITH CONTRAST
TECHNIQUE: Multiplanar and multiecho pulse sequences of the lumbar
spine were obtained without and with intravenous contrast.
Contrast: 20mL MULTIHANCE GADOBENATE DIMEGLUMINE 529 MG/ML IV SOLN

[Series 3: T2 · sagittal · 4.0mm · 0.55mm/px · 2 of 14 slices shown (1 of 2)]
[im 1/14]
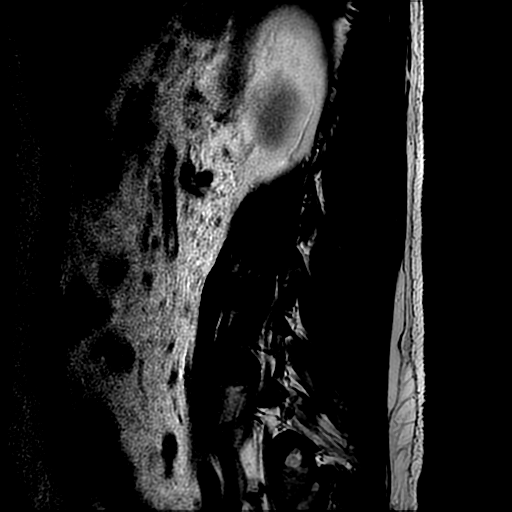
[im 14/14]
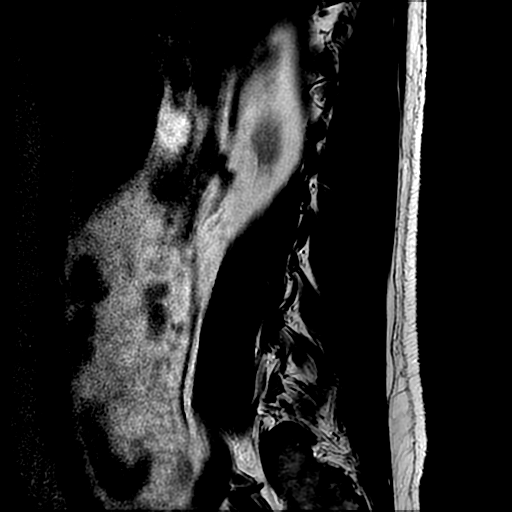

[Series 4: T1 · sagittal · 4.0mm · 0.55mm/px · 3 of 14 slices shown (1 of 2)]
[im 1/14]
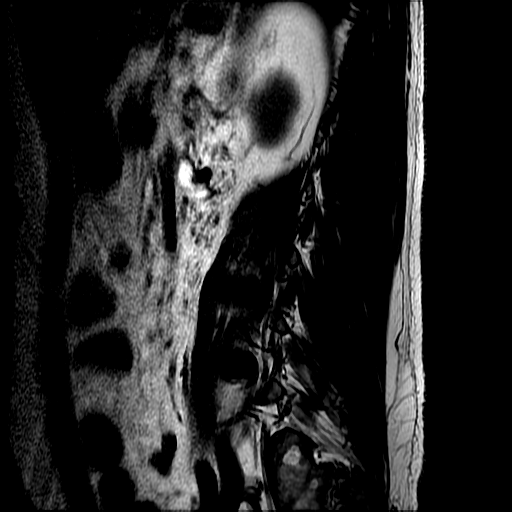
[im 7/14]
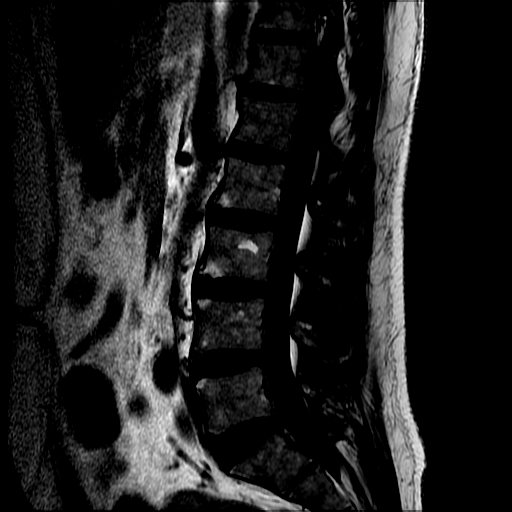
[im 14/14]
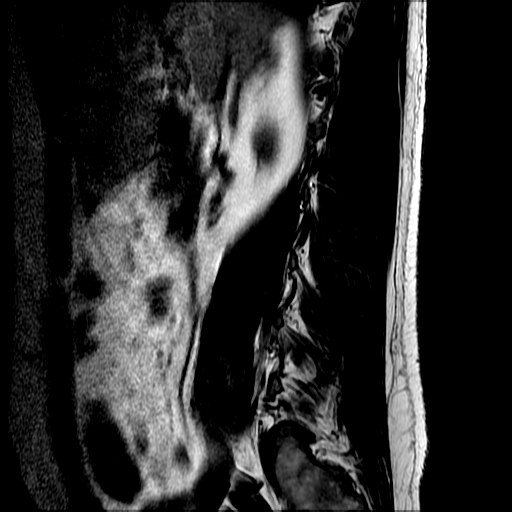

[Series 6: T2 · axial · 4.0mm · 0.39mm/px · z∈[-46,+162]mm · 9 of 36 slices shown (2 of 2)]
[im 1/36]
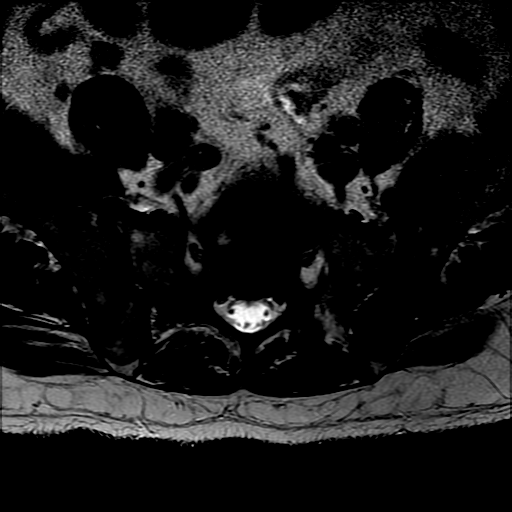
[im 5/36]
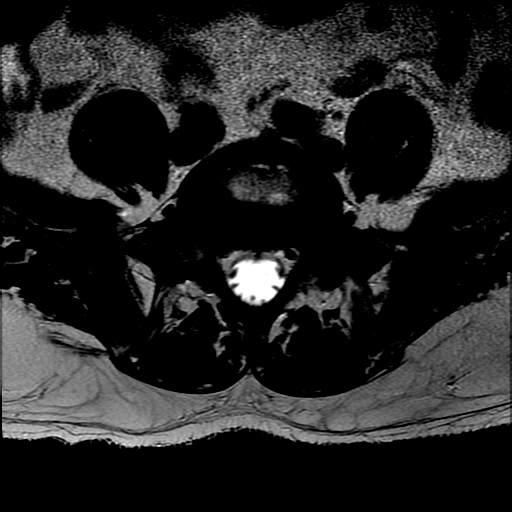
[im 9/36]
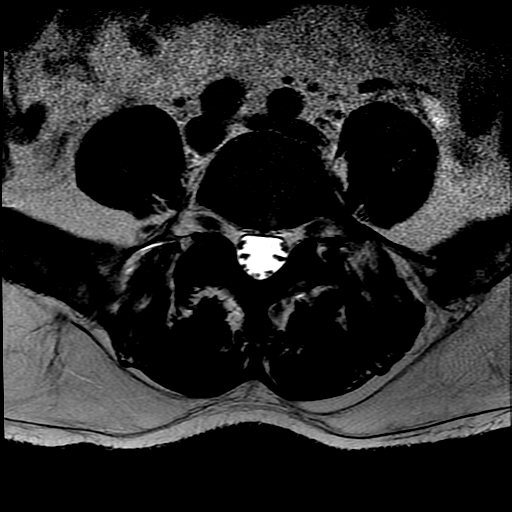
[im 14/36]
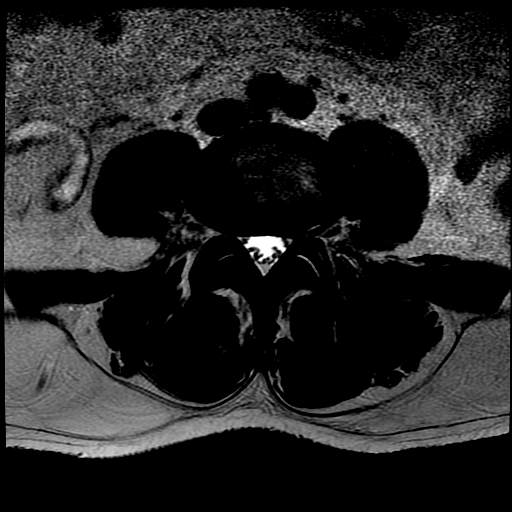
[im 18/36]
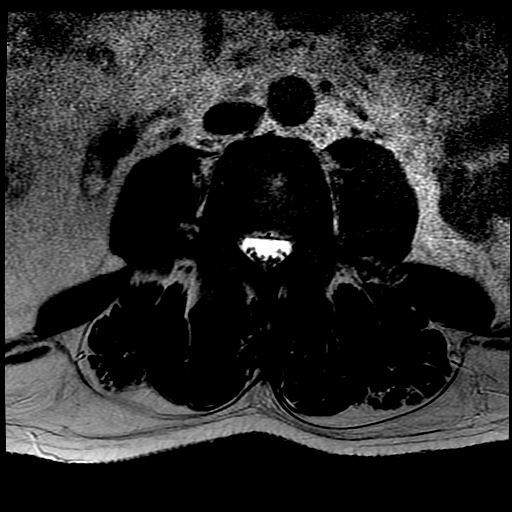
[im 22/36]
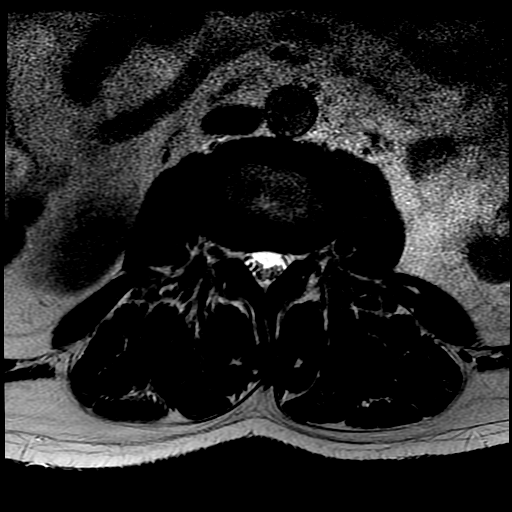
[im 27/36]
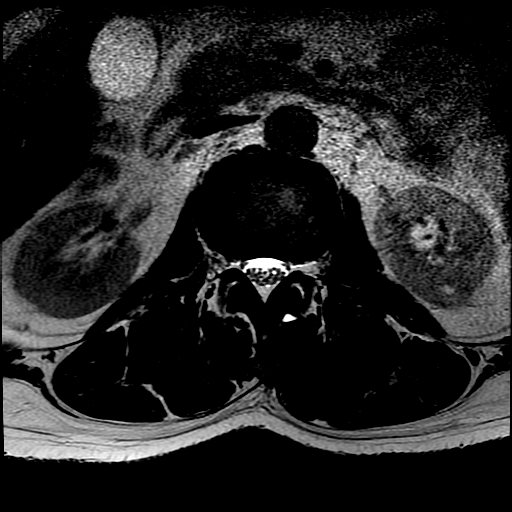
[im 31/36]
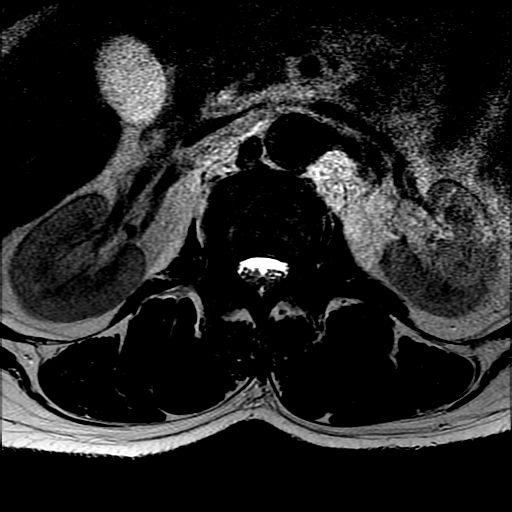
[im 36/36]
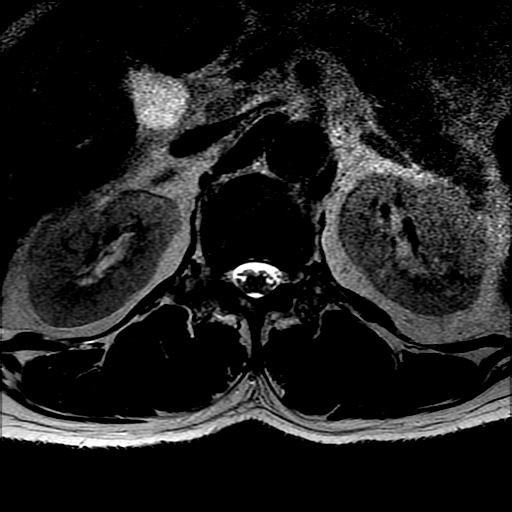

[Series 7: T1 · axial · 4.0mm · 0.39mm/px · z∈[-46,+137]mm · 5 of 36 slices shown (2 of 2)]
[im 1/36]
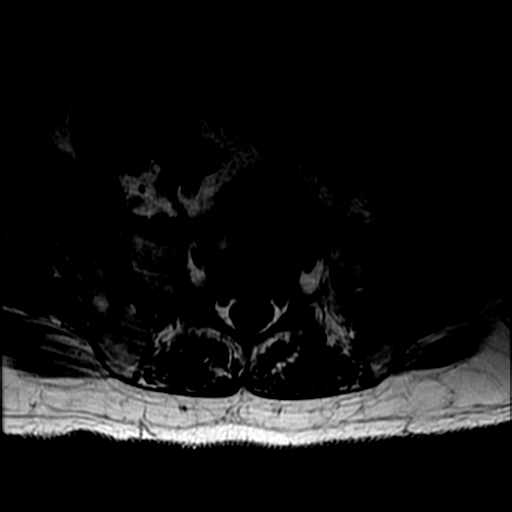
[im 5/36]
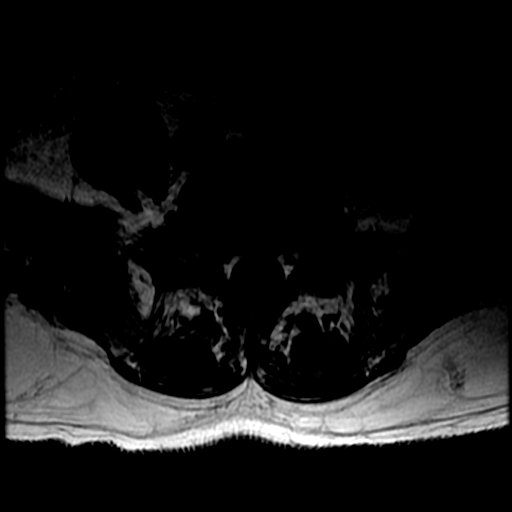
[im 9/36]
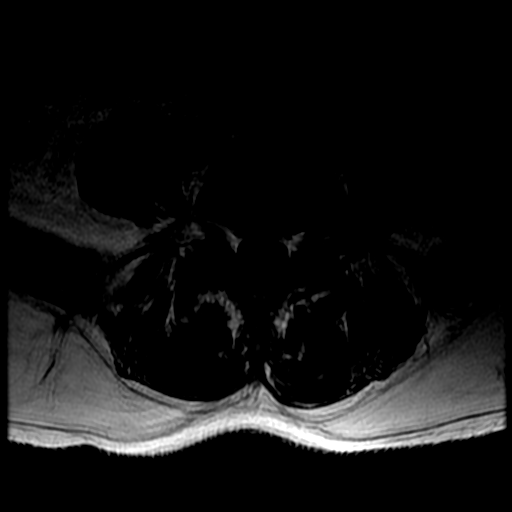
[im 18/36]
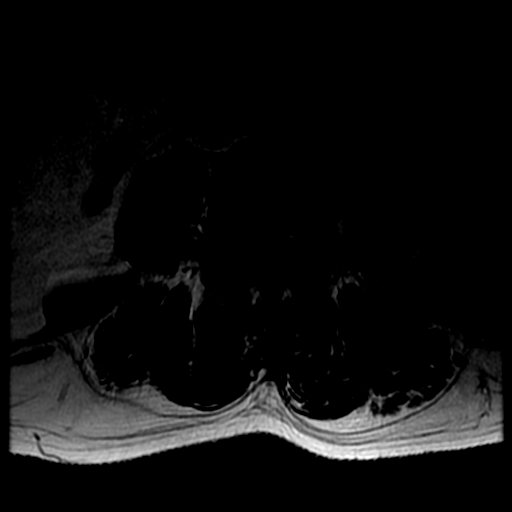
[im 31/36]
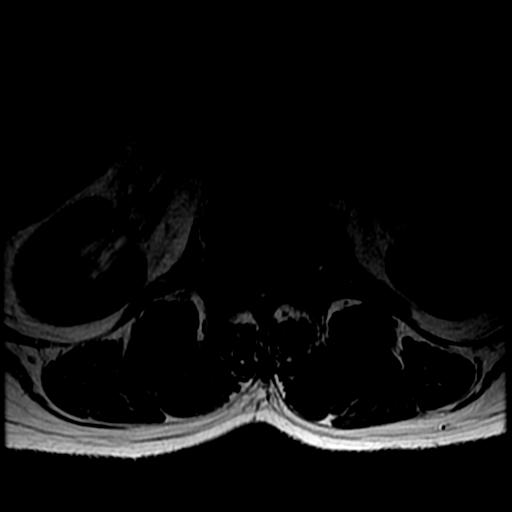

[19 of 48 positions shown; findings below may reference images not displayed]

FINDINGS: The numbering convention used for this exam terms L5-S1
as the last full intervertebral disc space above the sacrum.  There
is a mild levoconvex lumbar scoliosis with the apex at L4.  Bone
marrow signal shows suppression of fatty marrow, which is a
nonspecific finding, commonly associated with anemia, chronic
disease, cigarette smoking, and obesity.  A nonenhancing 1 cm
cystic lesion is present in the left interpolar kidney, consistent
with simple cyst. There is a small septated cystic lesion posterior
to the left L2 lamina, most compatible with a ganglion/synovial
cyst arising from the posterior L2-L3 facet joint.  The spinal cord
terminates posterior to the L1-L2 interspace.  Vertebral body
height is preserved.

L1-L2:  Negative.

L2-L3:  Negative.

L3-L4:  Negative.

L4-L5:  Disc desiccation.  Left foraminal annular tear and shallow
disc protrusion potentially irritating the exiting left L4 nerve.
No neural compression.  Right foramen is patent.  Central canal and
lateral recesses patent.  Facet joints appear within normal limits.

L5-S1:  Normal appearing disc.  Posterior small extraspinal
synovial cysts arising from the dorsal aspect of the L5-S1 facet
joints.
IMPRESSION: 1.  Negative for epidural abscess.
2.  L4-L5 shallow left foraminal protrusion with associated annular
tear potentially irritating the left L4 nerve.  No compressive
stenosis.

## 2012-11-22 ENCOUNTER — Ambulatory Visit: Payer: Self-pay | Admitting: Internal Medicine

## 2012-12-01 ENCOUNTER — Other Ambulatory Visit: Payer: Self-pay | Admitting: *Deleted

## 2012-12-01 NOTE — Telephone Encounter (Signed)
The patient has not been seen since May 2013, was advised at that time that he needs very close followup as we cannot address his multiple, multiple, multiple concerns at a single visit if he does not followup closely. I refilled his medications last month, with instruction that I would no longer be refilling his medications until he comes for followup visit. He has had multiple no-shows, therefore I do not feel comfortable refilling his medications until he comes for an appointment.  Please call and inform patient of this issue that he needs to be seen before additional refills will be given.  Johnette Abraham, D.O., 12/01/2012, 6:19 PM

## 2012-12-08 NOTE — Telephone Encounter (Signed)
Tried to call pt at [phone] - recording states number is incorrect - tried number x 2. Left message at [facility] both meds refused by Dr Saralyn Pilar - needs appt.

## 2012-12-10 ENCOUNTER — Emergency Department (HOSPITAL_COMMUNITY)
Admission: EM | Admit: 2012-12-10 | Discharge: 2012-12-10 | Disposition: A | Discharge status: Home / Self Care | Payer: No Typology Code available for payment source | Attending: Emergency Medicine | Admitting: Emergency Medicine

## 2012-12-10 ENCOUNTER — Emergency Department (HOSPITAL_COMMUNITY): Payer: No Typology Code available for payment source

## 2012-12-10 ENCOUNTER — Encounter (HOSPITAL_COMMUNITY): Payer: Self-pay | Admitting: Adult Health

## 2012-12-10 DIAGNOSIS — I509 Heart failure, unspecified: Secondary | ICD-10-CM

## 2012-12-10 DIAGNOSIS — R059 Cough, unspecified: Secondary | ICD-10-CM | POA: Insufficient documentation

## 2012-12-10 DIAGNOSIS — Z8719 Personal history of other diseases of the digestive system: Secondary | ICD-10-CM | POA: Insufficient documentation

## 2012-12-10 DIAGNOSIS — R05 Cough: Secondary | ICD-10-CM | POA: Insufficient documentation

## 2012-12-10 DIAGNOSIS — Z8619 Personal history of other infectious and parasitic diseases: Secondary | ICD-10-CM | POA: Insufficient documentation

## 2012-12-10 DIAGNOSIS — I10 Essential (primary) hypertension: Secondary | ICD-10-CM | POA: Insufficient documentation

## 2012-12-10 DIAGNOSIS — F1910 Other psychoactive substance abuse, uncomplicated: Secondary | ICD-10-CM | POA: Insufficient documentation

## 2012-12-10 DIAGNOSIS — E119 Type 2 diabetes mellitus without complications: Secondary | ICD-10-CM | POA: Insufficient documentation

## 2012-12-10 DIAGNOSIS — Z794 Long term (current) use of insulin: Secondary | ICD-10-CM | POA: Insufficient documentation

## 2012-12-10 DIAGNOSIS — Z79899 Other long term (current) drug therapy: Secondary | ICD-10-CM | POA: Insufficient documentation

## 2012-12-10 DIAGNOSIS — J029 Acute pharyngitis, unspecified: Secondary | ICD-10-CM

## 2012-12-10 DIAGNOSIS — B349 Viral infection, unspecified: Secondary | ICD-10-CM

## 2012-12-10 DIAGNOSIS — B9789 Other viral agents as the cause of diseases classified elsewhere: Secondary | ICD-10-CM | POA: Insufficient documentation

## 2012-12-10 DIAGNOSIS — Z87828 Personal history of other (healed) physical injury and trauma: Secondary | ICD-10-CM | POA: Insufficient documentation

## 2012-12-10 DIAGNOSIS — F17200 Nicotine dependence, unspecified, uncomplicated: Secondary | ICD-10-CM | POA: Insufficient documentation

## 2012-12-10 LAB — RAPID STREP SCREEN (MED CTR MEBANE ONLY): Streptococcus, Group A Screen (Direct): NEGATIVE

## 2012-12-10 MED ORDER — FUROSEMIDE 20 MG PO TABS
40.0000 mg | ORAL_TABLET | Freq: Once | ORAL | Status: AC
  Administered 2012-12-10: 40 mg via ORAL
  Filled 2012-12-10: qty 2

## 2012-12-10 MED ORDER — DEXAMETHASONE SODIUM PHOSPHATE 10 MG/ML IJ SOLN
10.0000 mg | Freq: Once | INTRAMUSCULAR | Status: AC
  Administered 2012-12-10: 10 mg via INTRAMUSCULAR
  Filled 2012-12-10: qty 1

## 2012-12-10 MED ORDER — ACETAMINOPHEN-CODEINE #3 300-30 MG PO TABS
1.0000 | ORAL_TABLET | Freq: Once | ORAL | Status: AC
  Administered 2012-12-10: 1 via ORAL
  Filled 2012-12-10: qty 1

## 2012-12-10 MED ORDER — ACETAMINOPHEN-CODEINE #3 300-30 MG PO TABS: 1.0000 | ORAL_TABLET | Freq: Four times a day (QID) | ORAL | Status: DC | PRN

## 2012-12-10 MED ORDER — LIDOCAINE VISCOUS 2 % MT SOLN
20.0000 mL | Freq: Once | OROMUCOSAL | Status: AC
  Administered 2012-12-10: 20 mL via OROMUCOSAL
  Filled 2012-12-10: qty 15
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
## 2012-12-10 NOTE — ED Notes (Signed)
Pt presents with 3 days of sore throat and painful swallowing. Throat is red. Denies fevers, denies nausea, denies headache.

## 2012-12-10 NOTE — ED Notes (Signed)
C/o sore throat x 3 days. Increased pain with swallowing. Denies fever, chills. C/o cough, nasal congestion onset this morning.  Reports has not taken his BP meds today.

## 2013-04-07 ENCOUNTER — Encounter: Payer: No Typology Code available for payment source | Admitting: Internal Medicine

## 2013-04-07 ENCOUNTER — Ambulatory Visit: Payer: No Typology Code available for payment source | Admitting: Dietician

## 2013-04-29 IMAGING — US US ABDOMEN COMPLETE
1 series · 13 of 25 positions shown · non-contrast
Comparison: MRI 11/15/2011.

CLINICAL DATA: History of abnormal liver function test.  HCV
infection.  Diabetes.

ABDOMINAL ULTRASOUND COMPLETE

[Series 1: us abdomen complete · 0.31mm/px · 13 of 88 slices shown]
[im 1/88]
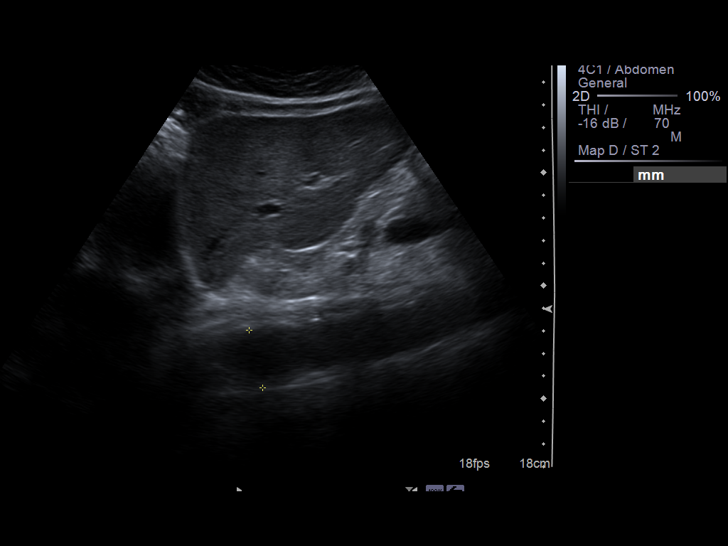
[im 8/88]
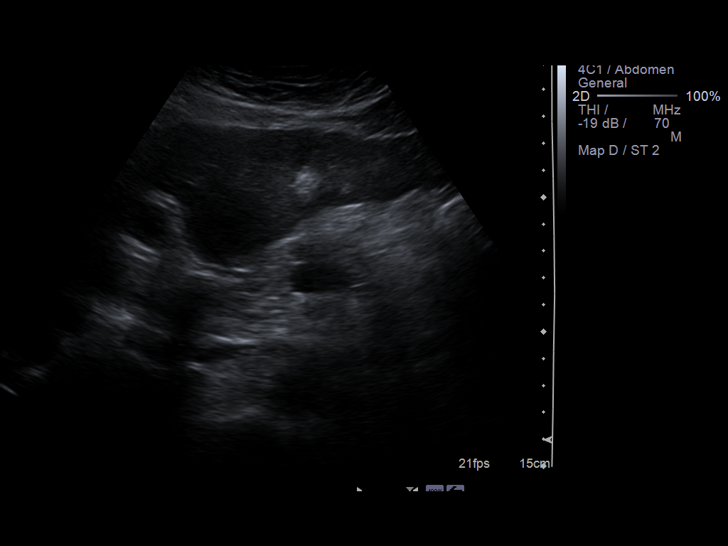
[im 15/88]
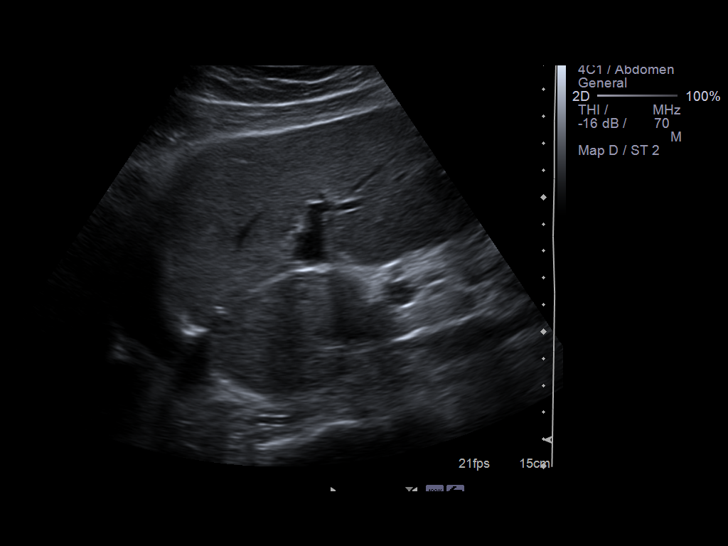
[im 22/88]
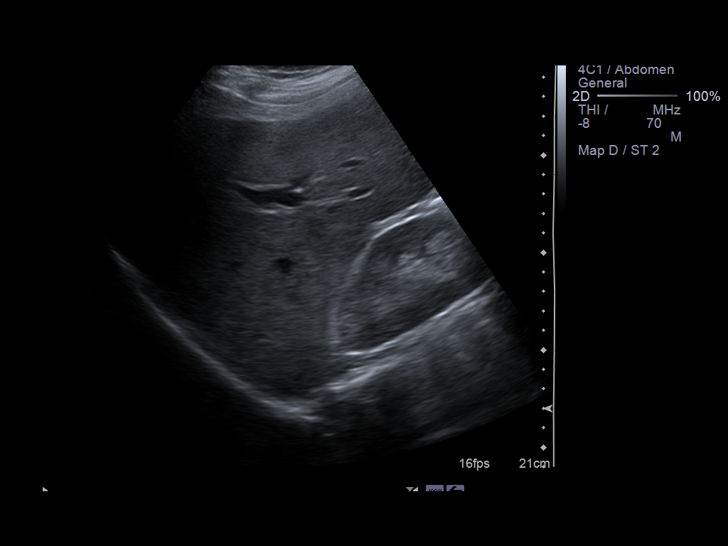
[im 30/88]
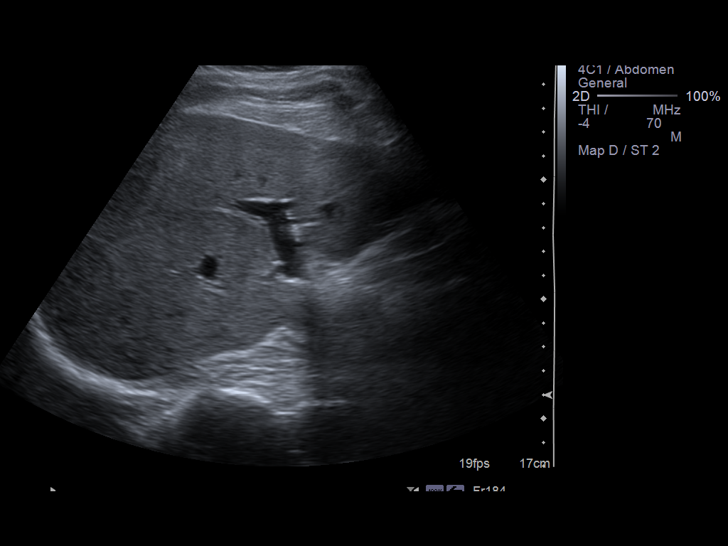
[im 37/88]
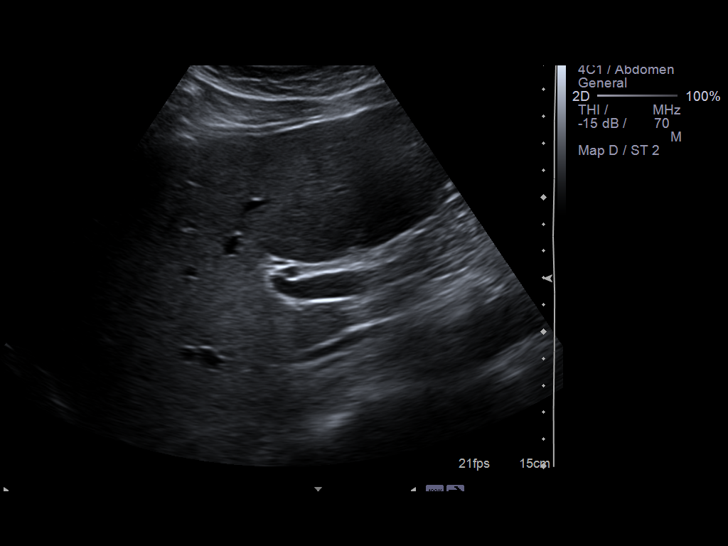
[im 44/88]
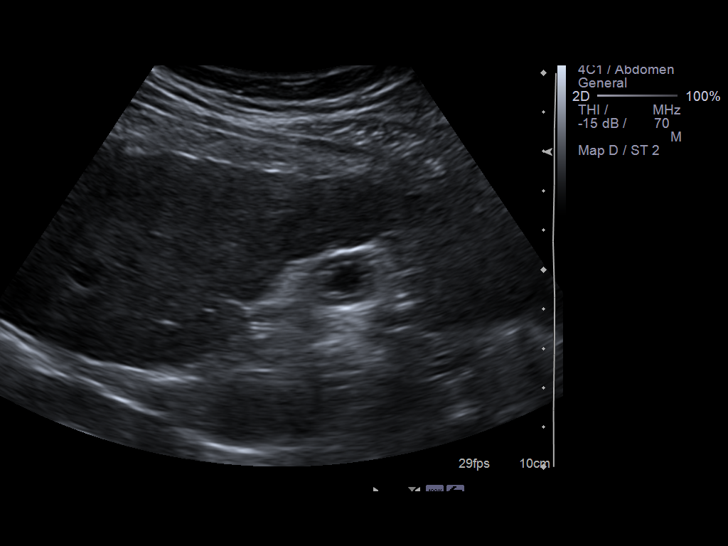
[im 51/88]
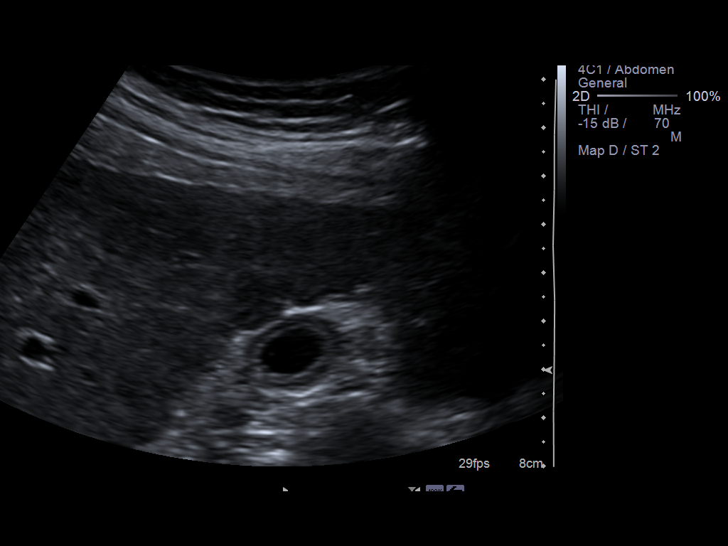
[im 59/88]
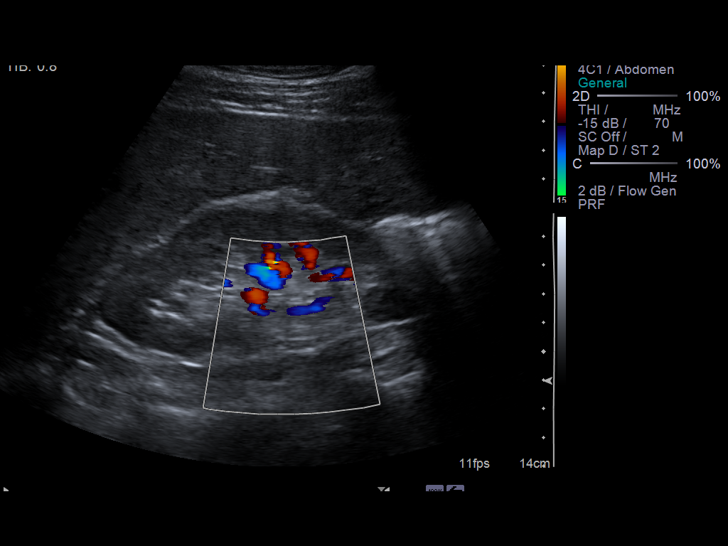
[im 66/88]
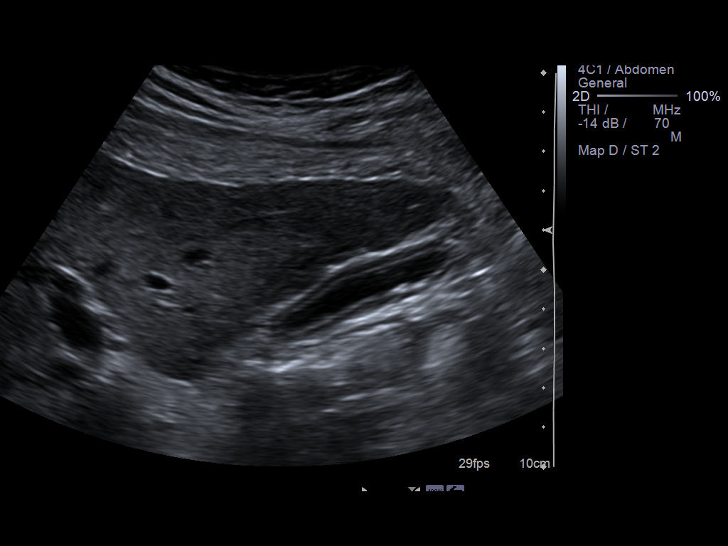
[im 73/88]
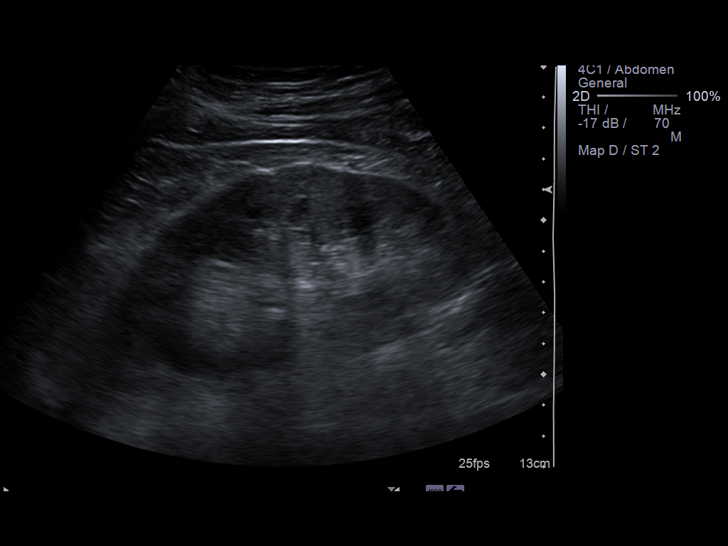
[im 80/88]
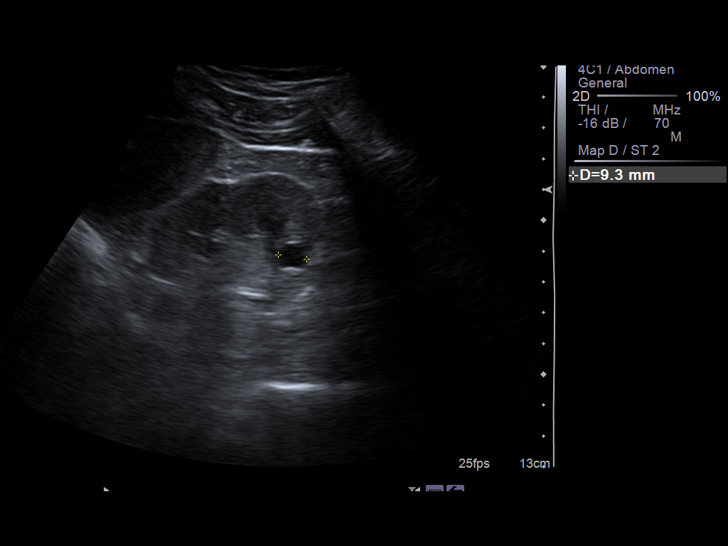
[im 88/88]
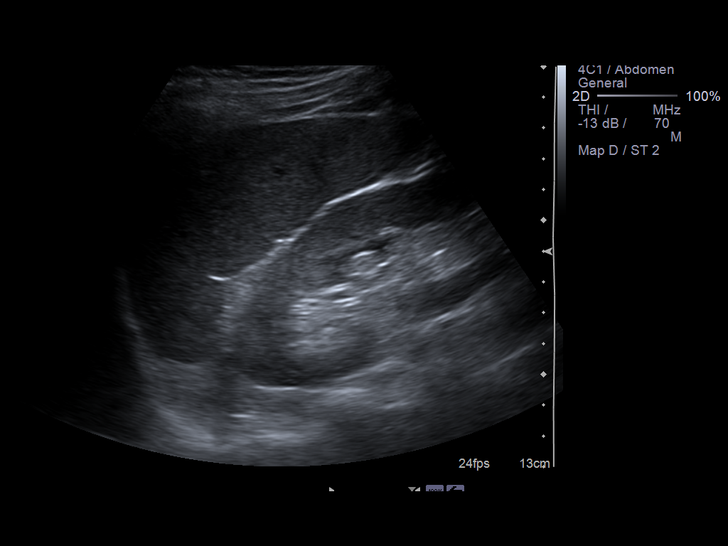

[13 of 25 positions shown; findings below may reference images not displayed]

FINDINGS: Gallbladder: No definite shadowing gallstones. Focal nonmobile
areas of increased echogenicity contiguous with the mucosal surface
of the gallbladder.. No definite acoustic shadowing posterior to
them to diagnose calculi definitely.  These may reflect small
polypoid areas or Anji sinuses associated with
adenomyomatosis of the gallbladder.  No gallbladder wall thickening
or pericholecystic fluid. The gallbladder wall thickness measured
2.5 mm. No sonographic Murphy's sign according to the ultrasound
technologist.

CBD: Normal in caliber measuring 4.4 mm. No choledocholithiasis is
evident.

Liver:  Normal size.  Inhomogeneous areas of increase echogenicity
of parenchymal echotexture without focal parenchymal mass.. Hepatic
veins appear patent.  Portal vein is patent with hepatopetal flow.

IVC:  Patent throughout its visualized course in the abdomen.

Pancreas:  Although the pancreas is difficult to visualize in its
entirety, no focal pancreatic abnormality is identified.

Spleen:  Normal size and echotexture without focal abnormality.
Length is 9 cm.

Right kidney:  No hydronephrosis.  Well-preserved cortex.  Normal
parenchymal echotexture without focal abnormalities.  Right renal
length is 11.6 cm.

Left kidney:  No hydronephrosis.  Well-preserved cortex.  Normal
parenchymal echotexture.  There is a small simple cyst of the lower
midportion of the left kidney.  This was also present on the
previous MRI. The cyst appears simple. The cyst measures 11 x 9 x 9
mm..  Left renal length is 11.7 cm.

Aorta:  Maximum diameter is 2.6 cm.  No aneurysm is evident. The
area of the aortic bifurcation was obscured by bowel gas and cannot
be evaluated.

Ascites:  None.
IMPRESSION: No definite shadowing gallstones. Focal nonmobile areas of
increased echogenicity contiguous with the mucosal surface of the
gallbladder.. No definite acoustic shadowing posterior to them to
diagnose calculi definitely.  These may reflect small polypoid
areas or Anji sinuses associated with adenomyomatosis
of the gallbladder.

Liver appears to be normal size.  There are areas of inhomogeneous
increased echogenicity of the hepatic parenchymal echotexture.
This could be associated with inhomogeneous fatty infiltration of
the liver or hepatocellular disease.  No discrete mass was evident.
Hepatic veins appear patent.  Portal vein is patent with
hepatopetal flow.

Stable simple cyst of left kidney.

## 2013-06-30 ENCOUNTER — Other Ambulatory Visit: Payer: Self-pay

## 2013-11-10 ENCOUNTER — Encounter (HOSPITAL_COMMUNITY): Payer: Self-pay | Admitting: Emergency Medicine

## 2013-11-10 ENCOUNTER — Emergency Department (HOSPITAL_COMMUNITY)
Admission: EM | Admit: 2013-11-10 | Discharge: 2013-11-10 | Disposition: A | Discharge status: Home / Self Care | Payer: Self-pay | Attending: Emergency Medicine | Admitting: Emergency Medicine

## 2013-11-10 DIAGNOSIS — R609 Edema, unspecified: Secondary | ICD-10-CM | POA: Insufficient documentation

## 2013-11-10 DIAGNOSIS — R21 Rash and other nonspecific skin eruption: Secondary | ICD-10-CM | POA: Insufficient documentation

## 2013-11-10 DIAGNOSIS — I509 Heart failure, unspecified: Secondary | ICD-10-CM | POA: Insufficient documentation

## 2013-11-10 DIAGNOSIS — Z794 Long term (current) use of insulin: Secondary | ICD-10-CM | POA: Insufficient documentation

## 2013-11-10 DIAGNOSIS — Z79899 Other long term (current) drug therapy: Secondary | ICD-10-CM | POA: Insufficient documentation

## 2013-11-10 DIAGNOSIS — Z87828 Personal history of other (healed) physical injury and trauma: Secondary | ICD-10-CM | POA: Insufficient documentation

## 2013-11-10 DIAGNOSIS — E119 Type 2 diabetes mellitus without complications: Secondary | ICD-10-CM | POA: Insufficient documentation

## 2013-11-10 DIAGNOSIS — Z87891 Personal history of nicotine dependence: Secondary | ICD-10-CM | POA: Insufficient documentation

## 2013-11-10 DIAGNOSIS — R600 Localized edema: Secondary | ICD-10-CM

## 2013-11-10 DIAGNOSIS — I10 Essential (primary) hypertension: Secondary | ICD-10-CM | POA: Insufficient documentation

## 2013-11-10 DIAGNOSIS — Z8619 Personal history of other infectious and parasitic diseases: Secondary | ICD-10-CM | POA: Insufficient documentation

## 2013-11-10 DIAGNOSIS — Z8719 Personal history of other diseases of the digestive system: Secondary | ICD-10-CM | POA: Insufficient documentation

## 2013-11-10 LAB — GLUCOSE, CAPILLARY: Glucose-Capillary: 183 mg/dL — ABNORMAL HIGH (ref 70–99)

## 2013-11-10 NOTE — ED Notes (Signed)
Patient reports that he has had increased swelling to bilateral legs R>L that has been getting progressively worse in the last 10 days.. Patient has a history of CHF, MI. Patient has bilateral red rash on legs.
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
## 2013-11-11 NOTE — ED Provider Notes (Signed)
Medical screening examination/treatment/procedure(s) were performed by non-physician practitioner and as supervising physician I was immediately available for consultation/collaboration.  EKG Interpretation   None        Flint Melter, MD 11/11/13 1231

## 2013-12-13 IMAGING — CR DG CHEST 2V
2 series · 2 of 2 positions shown · non-contrast
Comparison: 06/24/2010.

CLINICAL DATA: Cough.  Hemoptysis.  Smoker.

CHEST - 2 VIEW

[w chest pa]
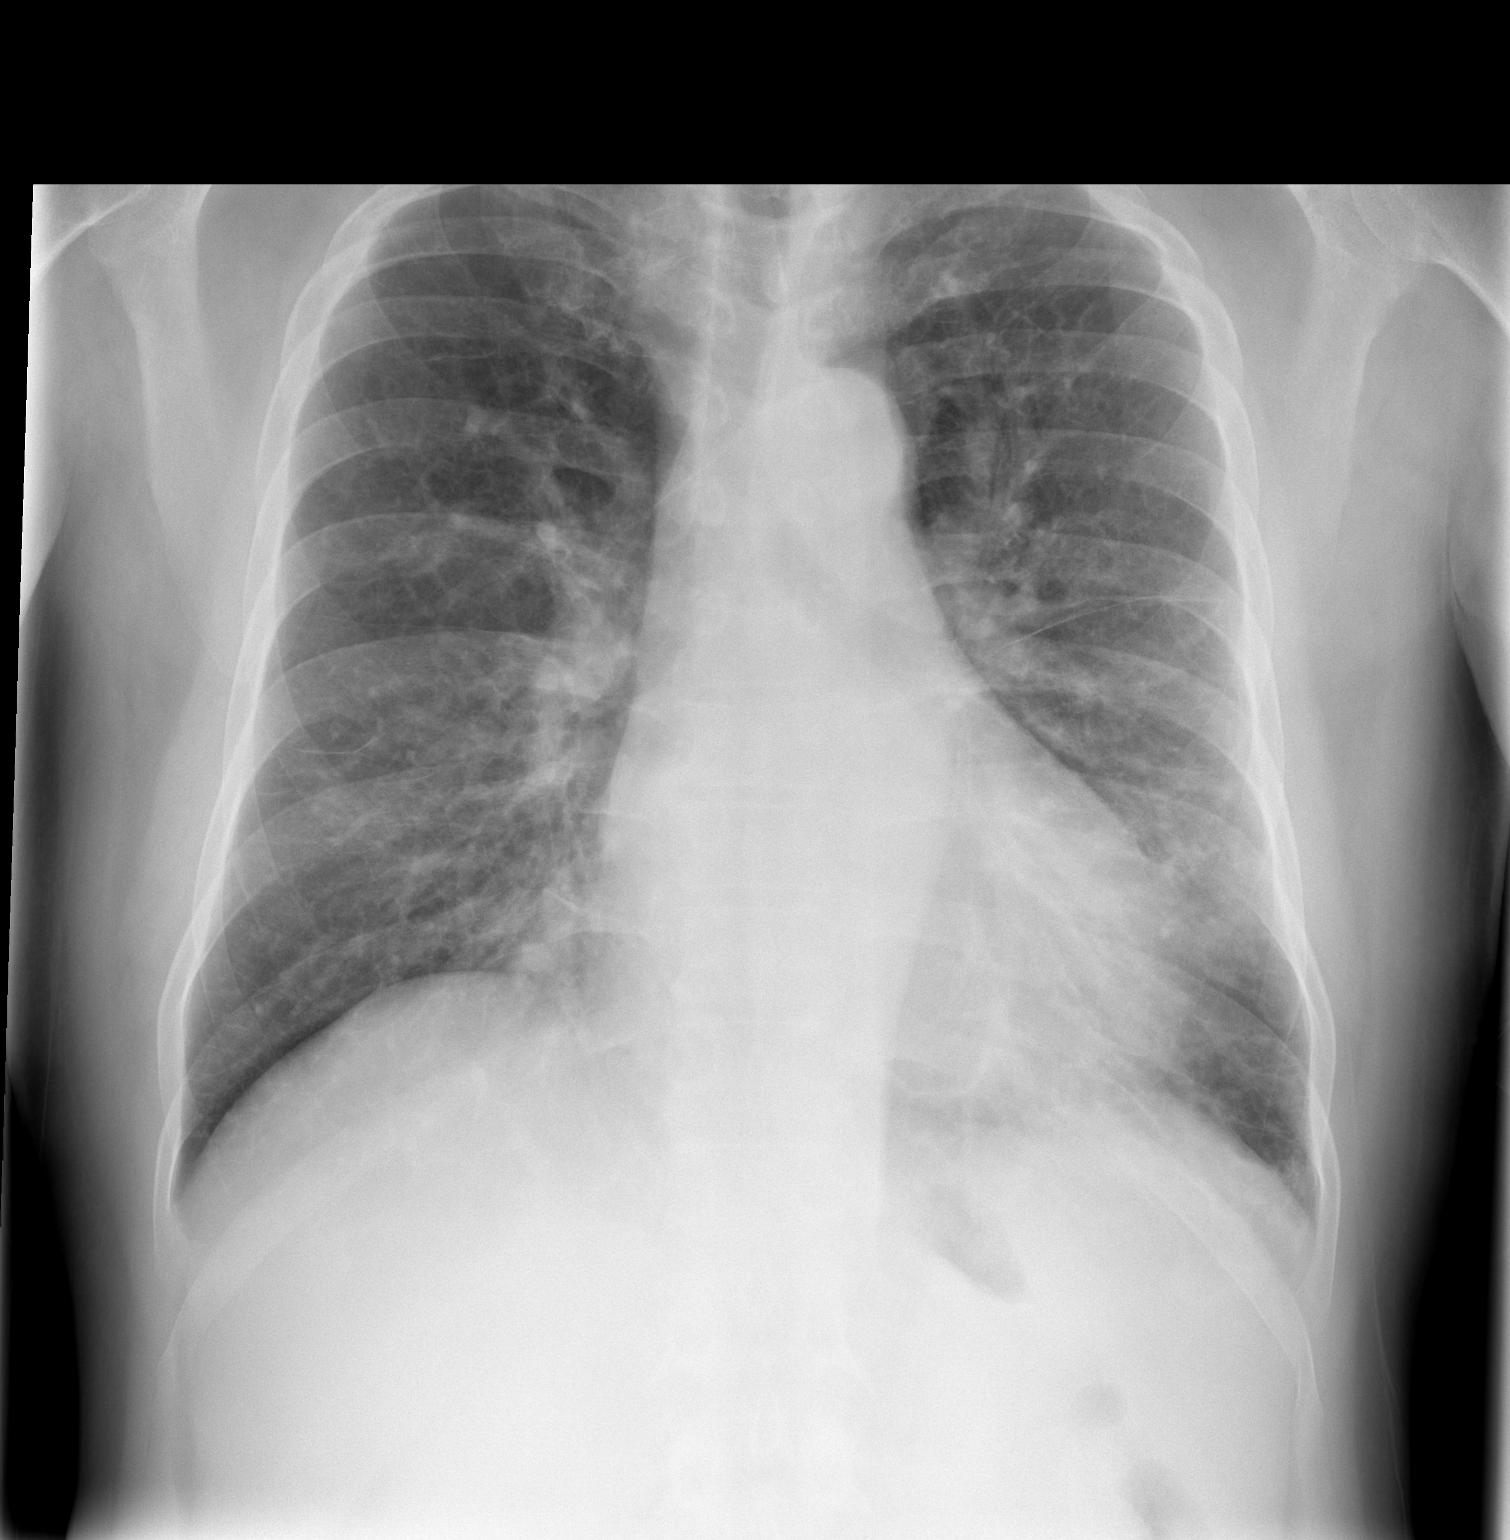

[w chest lat]
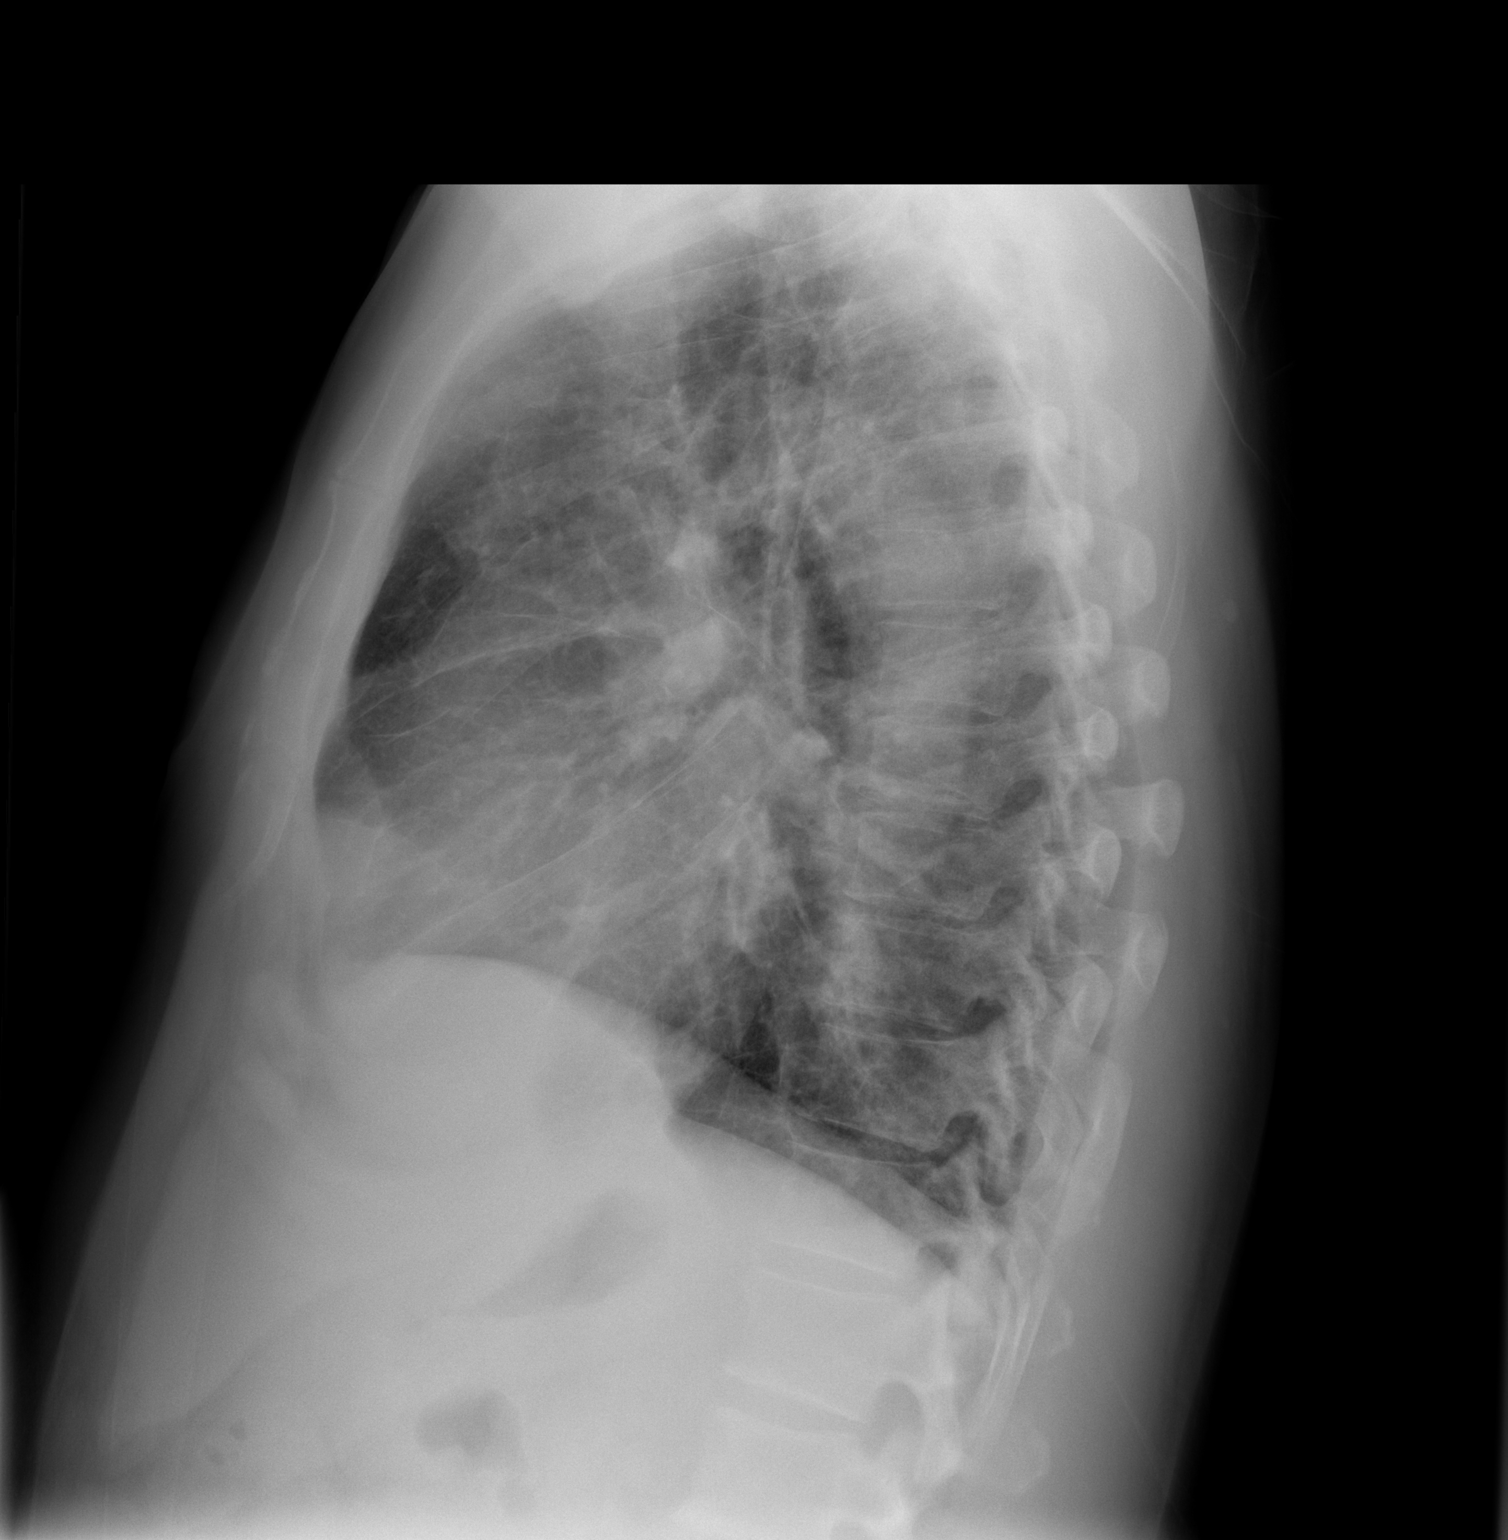

[2 of 2 positions shown; findings below may reference images not displayed]

FINDINGS: Cardiopericardial silhouette is upper limits of normal
for projection.  Perihilar and basilar airspace opacity is present.
Kerley B lines are present at the periphery.  Findings compatible
with interstitial and mild alveolar pulmonary edema and CHF.  No
pleural effusions are identified.  Thickening of the fissures is
present on the lateral view.
IMPRESSION: Mild CHF.

## 2015-10-14 HISTORY — PX: CARDIAC CATHETERIZATION: SHX172

## 2015-11-14 ENCOUNTER — Encounter (HOSPITAL_COMMUNITY): Payer: Self-pay | Admitting: Emergency Medicine

## 2015-11-14 ENCOUNTER — Inpatient Hospital Stay (HOSPITAL_COMMUNITY)
Admission: EM | Admit: 2015-11-14 | Discharge: 2015-11-16 | DRG: 312 | Disposition: A | Discharge status: Home / Self Care | Payer: Self-pay | Attending: Family Medicine | Admitting: Family Medicine

## 2015-11-14 ENCOUNTER — Emergency Department (HOSPITAL_COMMUNITY): Payer: Self-pay

## 2015-11-14 DIAGNOSIS — E872 Acidosis: Secondary | ICD-10-CM | POA: Diagnosis present

## 2015-11-14 DIAGNOSIS — Z79899 Other long term (current) drug therapy: Secondary | ICD-10-CM

## 2015-11-14 DIAGNOSIS — K219 Gastro-esophageal reflux disease without esophagitis: Secondary | ICD-10-CM | POA: Diagnosis present

## 2015-11-14 DIAGNOSIS — F17210 Nicotine dependence, cigarettes, uncomplicated: Secondary | ICD-10-CM | POA: Diagnosis present

## 2015-11-14 DIAGNOSIS — I951 Orthostatic hypotension: Principal | ICD-10-CM | POA: Diagnosis present

## 2015-11-14 DIAGNOSIS — E1165 Type 2 diabetes mellitus with hyperglycemia: Secondary | ICD-10-CM | POA: Diagnosis present

## 2015-11-14 DIAGNOSIS — Z794 Long term (current) use of insulin: Secondary | ICD-10-CM

## 2015-11-14 DIAGNOSIS — Z7982 Long term (current) use of aspirin: Secondary | ICD-10-CM

## 2015-11-14 DIAGNOSIS — I959 Hypotension, unspecified: Secondary | ICD-10-CM

## 2015-11-14 DIAGNOSIS — I110 Hypertensive heart disease with heart failure: Secondary | ICD-10-CM | POA: Diagnosis present

## 2015-11-14 DIAGNOSIS — I509 Heart failure, unspecified: Secondary | ICD-10-CM | POA: Diagnosis present

## 2015-11-14 DIAGNOSIS — B1920 Unspecified viral hepatitis C without hepatic coma: Secondary | ICD-10-CM | POA: Diagnosis present

## 2015-11-14 DIAGNOSIS — R7989 Other specified abnormal findings of blood chemistry: Secondary | ICD-10-CM | POA: Insufficient documentation

## 2015-11-14 DIAGNOSIS — N179 Acute kidney failure, unspecified: Secondary | ICD-10-CM | POA: Diagnosis present

## 2015-11-14 LAB — BASIC METABOLIC PANEL
Anion gap: 11 (ref 5–15)
BUN: 25 mg/dL — ABNORMAL HIGH (ref 6–20)
CO2: 26 mmol/L (ref 22–32)
Calcium: 9 mg/dL (ref 8.9–10.3)
Chloride: 97 mmol/L — ABNORMAL LOW (ref 101–111)
Creatinine, Ser: 2.45 mg/dL — ABNORMAL HIGH (ref 0.61–1.24)
GFR calc Af Amer: 32 mL/min — ABNORMAL LOW (ref >60)
GFR calc non Af Amer: 28 mL/min — ABNORMAL LOW (ref >60)
Glucose, Bld: 398 mg/dL — ABNORMAL HIGH (ref 65–99)
Potassium: 3.9 mmol/L (ref 3.5–5.1)
Sodium: 134 mmol/L — ABNORMAL LOW (ref 135–145)

## 2015-11-14 LAB — CBC WITH DIFFERENTIAL/PLATELET
Basophils Absolute: 0 10*3/uL (ref 0.0–0.1)
Basophils Relative: 0 %
Eosinophils Absolute: 0.1 10*3/uL (ref 0.0–0.7)
Eosinophils Relative: 2 %
HCT: 39.6 % (ref 39.0–52.0)
Hemoglobin: 13.8 g/dL (ref 13.0–17.0)
Lymphocytes Relative: 31 %
Lymphs Abs: 2 10*3/uL (ref 0.7–4.0)
MCH: 32.2 pg (ref 26.0–34.0)
MCHC: 34.8 g/dL (ref 30.0–36.0)
MCV: 92.5 fL (ref 78.0–100.0)
Monocytes Absolute: 0.5 10*3/uL (ref 0.1–1.0)
Monocytes Relative: 8 %
Neutro Abs: 3.8 10*3/uL (ref 1.7–7.7)
Neutrophils Relative %: 59 %
Platelets: 151 10*3/uL (ref 150–400)
RBC: 4.28 MIL/uL (ref 4.22–5.81)
RDW: 12.8 % (ref 11.5–15.5)
WBC: 6.4 10*3/uL (ref 4.0–10.5)

## 2015-11-14 LAB — I-STAT TROPONIN, ED: Troponin i, poc: 0.03 ng/mL (ref 0.00–0.08)

## 2015-11-14 LAB — I-STAT CG4 LACTIC ACID, ED
Lactic Acid, Venous: 2.18 mmol/L (ref 0.5–2.0)
Lactic Acid, Venous: 1.75 mmol/L (ref 0.5–2.0)

## 2015-11-14 MED ORDER — ONDANSETRON HCL 4 MG PO TABS: 4.0000 mg | ORAL_TABLET | Freq: Four times a day (QID) | ORAL | Status: DC | PRN

## 2015-11-14 MED ORDER — SODIUM CHLORIDE 0.9 % IV SOLN
INTRAVENOUS | Status: AC
  Administered 2015-11-15: via INTRAVENOUS

## 2015-11-14 MED ORDER — AMITRIPTYLINE HCL 50 MG PO TABS
25.0000 mg | ORAL_TABLET | Freq: Every morning | ORAL | Status: DC
  Administered 2015-11-15 – 2015-11-16 (×2): 25 mg via ORAL
  Filled 2015-11-14 (×2): qty 1

## 2015-11-14 MED ORDER — SODIUM CHLORIDE 0.9 % IV BOLUS (SEPSIS)
250.0000 mL | Freq: Once | INTRAVENOUS | Status: AC
  Administered 2015-11-14: 250 mL via INTRAVENOUS

## 2015-11-14 MED ORDER — ACETAMINOPHEN 325 MG PO TABS
650.0000 mg | ORAL_TABLET | Freq: Four times a day (QID) | ORAL | Status: DC | PRN
  Administered 2015-11-15: 650 mg via ORAL
  Filled 2015-11-14 (×2): qty 2

## 2015-11-14 MED ORDER — HYDRALAZINE HCL 20 MG/ML IJ SOLN
10.0000 mg | INTRAMUSCULAR | Status: DC | PRN
  Administered 2015-11-15: 10 mg via INTRAVENOUS
  Filled 2015-11-14: qty 1

## 2015-11-14 MED ORDER — INSULIN ASPART PROT & ASPART (70-30 MIX) 100 UNIT/ML ~~LOC~~ SUSP
30.0000 [IU] | Freq: Two times a day (BID) | SUBCUTANEOUS | Status: DC
  Administered 2015-11-15 – 2015-11-16 (×4): 30 [IU] via SUBCUTANEOUS
  Filled 2015-11-14: qty 10

## 2015-11-14 MED ORDER — AMITRIPTYLINE HCL 50 MG PO TABS
75.0000 mg | ORAL_TABLET | Freq: Every day | ORAL | Status: DC
  Administered 2015-11-15 (×2): 75 mg via ORAL
  Filled 2015-11-14: qty 2
  Filled 2015-11-14 (×2): qty 1

## 2015-11-14 MED ORDER — SERTRALINE HCL 50 MG PO TABS
150.0000 mg | ORAL_TABLET | Freq: Every day | ORAL | Status: DC
  Administered 2015-11-15 – 2015-11-16 (×2): 150 mg via ORAL
  Filled 2015-11-14 (×2): qty 1

## 2015-11-14 MED ORDER — INSULIN ASPART 100 UNIT/ML ~~LOC~~ SOLN
0.0000 [IU] | Freq: Three times a day (TID) | SUBCUTANEOUS | Status: DC
  Administered 2015-11-15 (×2): 2 [IU] via SUBCUTANEOUS
  Administered 2015-11-16: 3 [IU] via SUBCUTANEOUS
  Administered 2015-11-16: 2 [IU] via SUBCUTANEOUS

## 2015-11-14 MED ORDER — ACETAMINOPHEN 650 MG RE SUPP: 650.0000 mg | Freq: Four times a day (QID) | RECTAL | Status: DC | PRN

## 2015-11-14 MED ORDER — ATORVASTATIN CALCIUM 10 MG PO TABS
10.0000 mg | ORAL_TABLET | Freq: Every day | ORAL | Status: DC
  Administered 2015-11-15 – 2015-11-16 (×2): 10 mg via ORAL
  Filled 2015-11-14 (×2): qty 1

## 2015-11-14 MED ORDER — ONDANSETRON HCL 4 MG/2ML IJ SOLN: 4.0000 mg | Freq: Four times a day (QID) | INTRAMUSCULAR | Status: DC | PRN

## 2015-11-14 MED ORDER — ASPIRIN EC 81 MG PO TBEC
81.0000 mg | DELAYED_RELEASE_TABLET | Freq: Every day | ORAL | Status: DC
  Administered 2015-11-15 – 2015-11-16 (×2): 81 mg via ORAL
  Filled 2015-11-14 (×2): qty 1

## 2015-11-14 MED ORDER — ENOXAPARIN SODIUM 40 MG/0.4ML ~~LOC~~ SOLN
40.0000 mg | SUBCUTANEOUS | Status: DC
  Administered 2015-11-15 – 2015-11-16 (×2): 40 mg via SUBCUTANEOUS
  Filled 2015-11-14 (×2): qty 0.4

## 2015-11-14 NOTE — ED Notes (Signed)
Pt back from x-ray.
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
## 2015-11-14 NOTE — ED Notes (Signed)
Pt to xray

## 2015-11-14 NOTE — ED Notes (Signed)
Pt reported posterior neck pain, diaphoresis and dizziness before EMS was called, stated that he feels this way and knows his BP is about to drop

## 2015-11-14 NOTE — ED Notes (Signed)
Attempted report X1

## 2015-11-14 NOTE — ED Notes (Signed)
Pt in EMS from home, reporting not feeling well approx 2 hrs ago. Upon arrival supine BP 116/70, upright 74/48. Given 100NS, recent dx CHF.

## 2015-11-15 DIAGNOSIS — Z794 Long term (current) use of insulin: Secondary | ICD-10-CM

## 2015-11-15 DIAGNOSIS — E1121 Type 2 diabetes mellitus with diabetic nephropathy: Secondary | ICD-10-CM

## 2015-11-15 LAB — URINALYSIS, ROUTINE W REFLEX MICROSCOPIC
Bilirubin Urine: NEGATIVE
Glucose, UA: 250 mg/dL — AB
Hgb urine dipstick: NEGATIVE
Ketones, ur: NEGATIVE mg/dL
Leukocytes, UA: NEGATIVE
Nitrite: NEGATIVE
Protein, ur: 100 mg/dL — AB
Specific Gravity, Urine: 1.016 (ref 1.005–1.030)
pH: 5.5 (ref 5.0–8.0)

## 2015-11-15 LAB — GLUCOSE, CAPILLARY
Glucose-Capillary: 154 mg/dL — ABNORMAL HIGH (ref 65–99)
Glucose-Capillary: 152 mg/dL — ABNORMAL HIGH (ref 65–99)
Glucose-Capillary: 110 mg/dL — ABNORMAL HIGH (ref 65–99)
Glucose-Capillary: 149 mg/dL — ABNORMAL HIGH (ref 65–99)

## 2015-11-15 LAB — LACTIC ACID, PLASMA: Lactic Acid, Venous: 1.2 mmol/L (ref 0.5–2.0)

## 2015-11-15 LAB — COMPREHENSIVE METABOLIC PANEL
ALT: 48 U/L (ref 17–63)
AST: 35 U/L (ref 15–41)
Albumin: 3 g/dL — ABNORMAL LOW (ref 3.5–5.0)
Alkaline Phosphatase: 85 U/L (ref 38–126)
Anion gap: 9 (ref 5–15)
BUN: 25 mg/dL — ABNORMAL HIGH (ref 6–20)
CO2: 27 mmol/L (ref 22–32)
Calcium: 8.8 mg/dL — ABNORMAL LOW (ref 8.9–10.3)
Chloride: 97 mmol/L — ABNORMAL LOW (ref 101–111)
Creatinine, Ser: 2.25 mg/dL — ABNORMAL HIGH (ref 0.61–1.24)
GFR calc Af Amer: 36 mL/min — ABNORMAL LOW (ref >60)
GFR calc non Af Amer: 31 mL/min — ABNORMAL LOW (ref >60)
Glucose, Bld: 285 mg/dL — ABNORMAL HIGH (ref 65–99)
Potassium: 3.8 mmol/L (ref 3.5–5.1)
Sodium: 133 mmol/L — ABNORMAL LOW (ref 135–145)
Total Bilirubin: 0.5 mg/dL (ref 0.3–1.2)
Total Protein: 7.6 g/dL (ref 6.5–8.1)

## 2015-11-15 LAB — CBC WITH DIFFERENTIAL/PLATELET
Basophils Absolute: 0 10*3/uL (ref 0.0–0.1)
Basophils Relative: 0 %
Eosinophils Absolute: 0.1 10*3/uL (ref 0.0–0.7)
Eosinophils Relative: 1 %
HCT: 39.5 % (ref 39.0–52.0)
Hemoglobin: 13.5 g/dL (ref 13.0–17.0)
Lymphocytes Relative: 29 %
Lymphs Abs: 2.1 10*3/uL (ref 0.7–4.0)
MCH: 31.7 pg (ref 26.0–34.0)
MCHC: 34.2 g/dL (ref 30.0–36.0)
MCV: 92.7 fL (ref 78.0–100.0)
Monocytes Absolute: 0.5 10*3/uL (ref 0.1–1.0)
Monocytes Relative: 7 %
Neutro Abs: 4.5 10*3/uL (ref 1.7–7.7)
Neutrophils Relative %: 62 %
Platelets: 146 10*3/uL — ABNORMAL LOW (ref 150–400)
RBC: 4.26 MIL/uL (ref 4.22–5.81)
RDW: 12.8 % (ref 11.5–15.5)
WBC: 7.2 10*3/uL (ref 4.0–10.5)

## 2015-11-15 LAB — RAPID URINE DRUG SCREEN, HOSP PERFORMED
Amphetamines: NOT DETECTED
Barbiturates: NOT DETECTED
Benzodiazepines: NOT DETECTED
Cocaine: NOT DETECTED
Opiates: NOT DETECTED
Tetrahydrocannabinol: NOT DETECTED

## 2015-11-15 LAB — MRSA PCR SCREENING: MRSA by PCR: NEGATIVE

## 2015-11-15 LAB — URINE MICROSCOPIC-ADD ON
Bacteria, UA: NONE SEEN
RBC / HPF: NONE SEEN RBC/hpf (ref 0–5)
WBC, UA: NONE SEEN WBC/hpf (ref 0–5)

## 2015-11-15 MED ORDER — CARVEDILOL 12.5 MG PO TABS
12.5000 mg | ORAL_TABLET | Freq: Two times a day (BID) | ORAL | Status: DC
  Administered 2015-11-15 – 2015-11-16 (×2): 12.5 mg via ORAL
  Filled 2015-11-15 (×2): qty 1

## 2015-11-15 MED ORDER — INFLUENZA VAC SPLIT QUAD 0.5 ML IM SUSY
0.5000 mL | PREFILLED_SYRINGE | INTRAMUSCULAR | Status: AC
  Administered 2015-11-16: 0.5 mL via INTRAMUSCULAR
  Filled 2015-11-15: qty 0.5

## 2015-11-15 MED ORDER — GABAPENTIN 300 MG PO CAPS
300.0000 mg | ORAL_CAPSULE | Freq: Two times a day (BID) | ORAL | Status: DC
  Administered 2015-11-15 – 2015-11-16 (×3): 300 mg via ORAL
  Filled 2015-11-15 (×3): qty 1

## 2015-11-15 NOTE — Progress Notes (Signed)
Brad [name] [id number] DOB: Dec 16, 1961 DOA: 11/14/2015 PCP: No PCP Per Patient  Brief narrative:  54 y/o ? HTn ? CHF recetly diagnosed by Cath ~ 3 wk pta in [place] H/o Hep C DM ty II AKI  Admitted with symptomatic hypotension 11/15/15   Past medical history-As per Problem list Chart reviewed as below-   Consultants:  none  Procedures:  none  Antibiotics:  none   Subjective   Well no issues No cough no fever no chills No further dizziness No cp   Objective    Interim History:   Telemetry: nsr   Objective: Filed Vitals:   11/14/15 2215 11/14/15 2313 11/15/15 0524 11/15/15 0700  BP: 121/75 125/75 125/80   Pulse: 61 61 61   Temp:  98 F (36.7 C) 97.5 F (36.4 C) 97.7 F (36.5 C)  TempSrc:  Oral Oral Oral  Resp: 19 16 14    Height:  5\' 11"  (1.803 m) 5\' 11"  (1.803 m)   Weight:  97.8 kg (215 lb 9.8 oz) 97.8 kg (215 lb 9.8 oz)   SpO2: 94% 96% 97%     Intake/Output Summary (Last 24 hours) at 11/15/15 0933 Last data filed at 11/15/15 0600  Gross per 24 hour  Intake 894.17 ml  Output    500 ml  Net 394.17 ml    Exam:  General: eomi ncat Cardiovascular: s1 s 2no m/r/g Respiratory: clear no added sound Abdomen:  Soft nt nd  Skin no le edema Neuro intact  Data Reviewed: Basic Metabolic Panel:  Recent Labs Lab 11/14/15 2011 11/15/15 0018  NA 134* 133*  K 3.9 3.8  CL 97* 97*  CO2 26 27  GLUCOSE 398* 285*  BUN 25* 25*  CREATININE 2.45* 2.25*  CALCIUM 9.0 8.8*   Liver Function Tests:  Recent Labs Lab 11/15/15 0018  AST 35  ALT 48  ALKPHOS 85  BILITOT 0.5  PROT 7.6  ALBUMIN 3.0*   No results for input(s): LIPASE, AMYLASE in the last 168 hours. No results for input(s): AMMONIA in the last 168 hours. CBC:  Recent Labs Lab 11/14/15 2011 11/15/15 0018  WBC 6.4 7.2  NEUTROABS 3.8 4.5  HGB 13.8 13.5  HCT 39.6 39.5  MCV 92.5 92.7  PLT 151 146*   Cardiac Enzymes: No results for input(s): CKTOTAL, CKMB,  CKMBINDEX, TROPONINI in the last 168 hours. BNP: Invalid input(s): POCBNP CBG:  Recent Labs Lab 11/15/15 0824  GLUCAP 154*    Recent Results (from the past 240 hour(s))  MRSA PCR Screening     Status: None   Collection Time: 11/15/15  1:16 AM  Result Value Ref Range Status   MRSA by PCR NEGATIVE NEGATIVE Final    Comment:        The GeneXpert MRSA Assay (FDA approved for NASAL specimens only), is one component of a comprehensive MRSA colonization surveillance program. It is not intended to diagnose MRSA infection nor to guide or monitor treatment for MRSA infections.      Studies:              All Imaging reviewed and is as per above notation   Scheduled Meds: . amitriptyline  25 mg Oral q morning - 10a  . amitriptyline  75 mg Oral QHS  . aspirin EC  81 mg Oral Daily  . atorvastatin  10 mg Oral Daily  . enoxaparin (LOVENOX) injection  40 mg Subcutaneous Q24H  . [START ON 11/16/2015] Influenza vac split quadrivalent PF  0.5 mL  Intramuscular Tomorrow-1000  . insulin aspart  0-9 Units Subcutaneous TID WC  . insulin aspart protamine- aspart  30 Units Subcutaneous BID WC  . sertraline  150 mg Oral Daily   Continuous Infusions:    Assessment/Plan:    1. Hypotension - possibly related to patient's medications. given IV saline 5 L-blood pressure seems to be resolved in the 1:30 to 150 range at present. Follow her lactic acid levels intake output and metabolic panel. 2. Renal failure probably acute - it is to be noted that patient also has had cardiac cath 3 weeks ago as per the patient. Patient's renal failure could also be due to hypotension and medications. BUN/creatinine on admission 25/2.4-currently25/2.2. We will hold IV saline for now. Lactic acidosis secondary to acute kidney injury probably 3. Diabetes mellitus type 2 uncontrolled - patient states his blood sugar levels has been fluctuating and has not been taking his insulin dose he was advised because sometimes  his blood sugar was low. At this time patient is on NovoLog 70/30 and I have placed patient on 30 units twice a day with sliding scale coverage. blood sugars ranging between 280 and 380 range 4. History of hepatitis C - further workup as outpatient. 5. Tobacco abuse - advised to quit tobacco.  Code Status: full Family Communication: no family present after Disposition Plan: inpatient ending resolution of renal insufficiency and monitor blood pressure   Verneita Griffes, MD  [facility] Pager [phone] 11/15/2015, 9:33 AM    LOS: 1 day

## 2015-11-15 NOTE — Progress Notes (Signed)
Pt transferred to the floor at  18:58, pt was settled in and vitals were taken. ctm

## 2015-11-16 LAB — COMPREHENSIVE METABOLIC PANEL
ALT: 53 U/L (ref 17–63)
AST: 44 U/L — ABNORMAL HIGH (ref 15–41)
Albumin: 3.1 g/dL — ABNORMAL LOW (ref 3.5–5.0)
Alkaline Phosphatase: 74 U/L (ref 38–126)
Anion gap: 8 (ref 5–15)
BUN: 19 mg/dL (ref 6–20)
CO2: 26 mmol/L (ref 22–32)
Calcium: 8.7 mg/dL — ABNORMAL LOW (ref 8.9–10.3)
Chloride: 101 mmol/L (ref 101–111)
Creatinine, Ser: 1.48 mg/dL — ABNORMAL HIGH (ref 0.61–1.24)
GFR calc Af Amer: 60 mL/min — ABNORMAL LOW (ref >60)
GFR calc non Af Amer: 52 mL/min — ABNORMAL LOW (ref >60)
Glucose, Bld: 200 mg/dL — ABNORMAL HIGH (ref 65–99)
Potassium: 3.5 mmol/L (ref 3.5–5.1)
Sodium: 135 mmol/L (ref 135–145)
Total Bilirubin: 0.6 mg/dL (ref 0.3–1.2)
Total Protein: 8.3 g/dL — ABNORMAL HIGH (ref 6.5–8.1)

## 2015-11-16 LAB — GLUCOSE, CAPILLARY
Glucose-Capillary: 191 mg/dL — ABNORMAL HIGH (ref 65–99)
Glucose-Capillary: 189 mg/dL — ABNORMAL HIGH (ref 65–99)
Glucose-Capillary: 208 mg/dL — ABNORMAL HIGH (ref 65–99)

## 2015-11-16 MED ORDER — AMITRIPTYLINE HCL 25 MG PO TABS
25.0000 mg | ORAL_TABLET | Freq: Every morning | ORAL | Status: DC
Start: 2015-11-16 — End: 2015-11-21

## 2015-11-16 MED ORDER — AMITRIPTYLINE HCL 75 MG PO TABS
75.0000 mg | ORAL_TABLET | Freq: Every day | ORAL | Status: DC
Start: 2015-11-16 — End: 2015-11-21

## 2015-11-16 MED ORDER — INSULIN ASPART PROT & ASPART (70-30 MIX) 100 UNIT/ML ~~LOC~~ SUSP: 30.0000 [IU] | Freq: Two times a day (BID) | SUBCUTANEOUS | Status: DC

## 2015-11-16 MED ORDER — CARVEDILOL 12.5 MG PO TABS
12.5000 mg | ORAL_TABLET | Freq: Two times a day (BID) | ORAL | Status: DC
Start: 2015-11-16 — End: 2015-11-21

## 2015-11-16 MED ORDER — CARVEDILOL 12.5 MG PO TABS: 12.5000 mg | ORAL_TABLET | Freq: Two times a day (BID) | ORAL | Status: DC

## 2015-11-16 MED ORDER — GABAPENTIN 300 MG PO CAPS: 300.0000 mg | ORAL_CAPSULE | Freq: Two times a day (BID) | ORAL | Status: DC

## 2015-11-16 MED ORDER — AMITRIPTYLINE HCL 25 MG PO TABS: 25.0000 mg | ORAL_TABLET | Freq: Every morning | ORAL | Status: DC

## 2015-11-16 MED ORDER — GLUCOSE BLOOD VI STRP: ORAL_STRIP | Status: DC

## 2015-11-16 MED ORDER — AMITRIPTYLINE HCL 75 MG PO TABS: 75.0000 mg | ORAL_TABLET | Freq: Every day | ORAL | Status: DC

## 2015-11-16 MED ORDER — FREESTYLE SYSTEM KIT: 1.0000 | PACK | Status: DC | PRN

## 2015-11-16 MED ORDER — FUROSEMIDE 40 MG PO TABS: 40.0000 mg | ORAL_TABLET | ORAL | Status: DC

## 2015-11-16 NOTE — Evaluation (Signed)
Physical Therapy Evaluation & Discharge Patient Details Name: Brad [name] MRN: [id number] DOB: [birth year] Today's Date: 11/16/2015   History of Present Illness  Brad [name] is a 54 y.o. male with history of hypertension, diabetes mellitus type 2, ongoing tobacco abuse and congestive heart failure with unknown EF presents to the ER because of weakness and dizziness.   Clinical Impression  Patient presents without current need for PT.  Orthostatic vitals taken as below.  Mild lightheadedness with initial standing, but resolved with ambulation.  No further skilled PT needs at this time.    Follow Up Recommendations No PT follow up    Equipment Recommendations  None recommended by PT    Recommendations for Other Services       Precautions / Restrictions Precautions Precautions: None      Mobility  Bed Mobility Overal bed mobility: Modified Independent                Transfers Overall transfer level: Modified independent Equipment used: None                Ambulation/Gait Ambulation/Gait assistance: Independent Ambulation Distance (Feet): 250 Feet Assistive device: None Gait Pattern/deviations: Step-through pattern;WFL(Within Functional Limits)        Stairs Stairs: Yes Stairs assistance: Supervision Stair Management: Alternating pattern;One rail Right;Forwards Number of Stairs: 10    Wheelchair Mobility    Modified [name] (Stroke Patients Only)       Balance Overall balance assessment: Independent                                           Pertinent Vitals/Pain Pain Assessment: 0-10 Pain Score: 10-Worst pain ever Pain Location: neuropathy pain in feet Pain Intervention(s): Monitored during session   Orthostatic VS for the past 24 hrs:  BP- Lying Pulse- Lying BP- Sitting Pulse- Sitting BP- Standing at 0 minutes Pulse- Standing at 0 minutes  11/16/15 1012 (!) 146/91 mmHg 71 138/90 mmHg 74 123/81 mmHg 77         Home Living Family/patient expects to be discharged to:: Group home     Type of Home: House       Home Layout: Two level;Bed/bath upstairs Home Equipment: None      Prior Function Level of Independence: Independent               Hand Dominance        Extremity/Trunk Assessment   Upper Extremity Assessment: Overall WFL for tasks assessed           Lower Extremity Assessment: RLE deficits/detail;LLE deficits/detail RLE Deficits / Details: strength generally WFL for tasks assessed LLE Deficits / Details: strength generally WFL for tasks assessed     Communication   Communication: No difficulties  Cognition Arousal/Alertness: Awake/alert Behavior During Therapy: WFL for tasks assessed/performed Overall Cognitive Status: Within Functional Limits for tasks assessed                      General Comments      Exercises        Assessment/Plan    PT Assessment Patent does not need any further PT services  PT Diagnosis Generalized weakness   PT Problem List    PT Treatment Interventions     PT Goals (Current goals can be found in the Care Plan section) Acute Rehab PT Goals PT Goal Formulation: All assessment and  education complete, DC therapy    Frequency     Barriers to discharge        Co-evaluation               End of Session Equipment Utilized During Treatment: Gait belt Activity Tolerance: Patient tolerated treatment well Patient left: in bed;with call bell/phone within reach           Time: 0920-0944 PT Time Calculation (min) (ACUTE ONLY): 24 min   Charges:   PT Evaluation $Initial PT Evaluation Tier I: 1 Procedure PT Treatments $Gait Training: 8-22 mins   PT G Codes:        Tecia Cinnamon,CYNDI 2015/11/24, 10:29 AM  Magda Kiel, [place] 11/24/2015

## 2015-11-16 NOTE — Care Management Note (Signed)
Case Management Note  Patient Details  Name: Brad [name] MRN: [id number] Date of Birth: September 16, 1961  Subjective/Objective:    Date: 11/16/15 Spoke with patient at the bedside.  Introduced self as Tourist information centre manager and explained role in discharge planning and how to be reached.  Verified [place] town, [place].  Expressed potential need for glucometer and strips, NCM assisted with getting glucometer from outpatient pharmacy, they were unable to give patient strips, NCM informed patient that he will receive 10 strips with glucometer and then he will need to call and make follow up apt at [facility] on Monday and go there to get additional strips.  Verified patient anticipates to go back to half way home at [place].  Patient confirmed needing help with their medication, NCM assisted with [facility] for neurontin.  Patient  is driven by cab to MD appointments.  Verified patient has PCP  Patient will follow up with [facility] on Monday to schedule an appt.   Plan: CM will continue to follow for discharge planning and [facility] resources.                 Action/Plan:   Expected Discharge Date:                  Expected Discharge Plan:  Home/Self Care  In-House Referral:  Clinical Social Work  Discharge planning Services  CM Consult, [facility] Program  Post Acute Care Choice:  Durable Medical Equipment Choice offered to:     DME Arranged:  Diabetic Supplies DME Agency:     HH Arranged:    [place]:     Status of Service:  Completed, signed off  Medicare Important Message Given:    Date Medicare IM Given:    Medicare IM give by:    Date Additional Medicare IM Given:    Additional Medicare Important Message give by:     If discussed at [place] of Stay Meetings, dates discussed:    Additional Comments:  Zenon Mayo, RN 11/16/2015, 1:37 PM

## 2015-11-16 NOTE — Discharge Summary (Signed)
Physician Discharge Summary  Brad [name] [id number] DOB: [birth year] DOA: 11/14/2015  PCP: No PCP Per Patient  Admit date: 11/14/2015 Discharge date: 11/16/2015  Time spent: 35 minutes  Recommendations for Outpatient Follow-up:  1. patient medications have been adjusted 2. Patient should take Coreg 12.5 twice a day down from dosage of 25 twice a day-this admission we have discontinued his lisinopril 40, Imdur 30, amlodipine 5, Lasix 40 3. He will need outpatient coordination and management with a new primary care physician if he does not have one 4. we have provided prescriptions forCoreg, Elavil, 7030 insulin and gabapentin as an outpatient 5. discharged home and did not need any home health 6. Will nee OP f/u recs as per ORtho regarding Bursitis of R elbow-given info for orthopedics to follow   Discharge Diagnoses:  Principal Problem:   Hypotension Active Problems:   AKI (acute kidney injury) ([place])   Diabetes mellitus type 2, controlled ([place])   Elevated lactic acid level   Discharge Condition: stable  Diet recommendation: diabetic heart healthy S2  Filed Weights   11/15/15 0524 11/15/15 1905 11/16/15 0536  Weight: 97.8 kg (215 lb 9.8 oz) 99 kg (218 lb 4.1 oz) 99 kg (218 lb 4.1 oz)    Hospital Course:   54 y/o ? HTn ? CHF recetly diagnosed by Cath ~ 3 wk pta in [place]-chart reviewed from [facility] in [place] and cardiac cath was negative H/o Hep C DM ty II AKI  Admitted with symptomatic hypotension 11/24/16patient was transiently placed in the stepdown unit and noted after 5 L of fluid resuscitation his hypotension improved and he became slightly hypertensive noted on admission he had acute kidney injury with creatinine and 2.5 range and GFR28-he was given slight fluid resuscitation and his ACE inhibitor, Lasix were discontinued His creatinine got better and GFR improved to 50 He had a lactic acidosis of 2.1 which secondary to renal failure  as he had no fever no chills He had a bilateral R>L sided elbow bursitis that was discussed with Dr. Bonney Aid patient didn't have fever ofr chills and this was not felt to be a septic process, patient was requested to f/ as per Dr.XU as an OP for this he looked well on day of discharge and was discharged home and will need follow-up as an outpatient Case manager was asked to assist with PCP and Diabetic needs  Consultations:  Telephone consulted Dr. Erlinda Hong of Ortho  Discharge Exam: Filed Vitals:   11/15/15 2231 11/16/15 0537  BP: 166/96 164/84  Pulse: 62 63  Temp:  97.7 F (36.5 C)  Resp:  16    General: alert pleasant oriented and in NAD Cardiovascular: s1 s2 no m/r/g Respiratory: clear no added sounds Elbows are swollen bilaterally with some fluid behind R elbow  Discharge Instructions   Discharge Instructions    Diet - low sodium heart healthy    Complete by:  As directed      Diet - low sodium heart healthy    Complete by:  As directed      Discharge instructions    Complete by:  As directed   please note changes to her medications Please get lab work as an outpatient We have refilled her medications as appropriate and he should only take them as has been mentioned We will try and set him up with primary care physician if you couple     Discharge instructions    Complete by:  As directed   Stop the medications  that have been discontinued off of your list Please get good follow up as an OP Please follow up with Primary MD-we will get a Case manager to see if you can get help with a  Diabetes machine     Increase activity slowly    Complete by:  As directed      Increase activity slowly    Complete by:  As directed           Current Discharge Medication List    START taking these medications   Details  gabapentin (NEURONTIN) 300 MG capsule Take 1 capsule (300 mg total) by mouth 2 (two) times daily. Qty: 30 capsule, Refills: 0    glucose blood test strip Use as  instructed Qty: 100 each, Refills: 12    glucose monitoring kit (FREESTYLE) monitoring kit 1 each by Does not apply route as needed for other. Qty: 1 each, Refills: 0    insulin aspart protamine- aspart (NOVOLOG MIX 70/30) (70-30) 100 UNIT/ML injection Inject 0.3 mLs (30 Units total) into the skin 2 (two) times daily with a meal. Qty: 10 mL, Refills: 11      CONTINUE these medications which have CHANGED   Details  !! amitriptyline (ELAVIL) 25 MG tablet Take 1 tablet (25 mg total) by mouth every morning. Qty: 30 tablet, Refills: 0    !! amitriptyline (ELAVIL) 75 MG tablet Take 1 tablet (75 mg total) by mouth at bedtime. Qty: 30 tablet, Refills: 0    carvedilol (COREG) 12.5 MG tablet Take 1 tablet (12.5 mg total) by mouth 2 (two) times daily with a meal. Qty: 30 tablet, Refills: 0    furosemide (LASIX) 40 MG tablet Take 1 tablet (40 mg total) by mouth every other day. Qty: 30 tablet, Refills: 0     !! - Potential duplicate medications found. Please discuss with provider.    CONTINUE these medications which have NOT CHANGED   Details  aspirin 81 MG tablet Take 81 mg by mouth daily.    atorvastatin (LIPITOR) 10 MG tablet Take 10 mg by mouth daily.    doxazosin (CARDURA) 4 MG tablet Take 4 mg by mouth daily.    glucose 4 GM chewable tablet Chew 1 tablet by mouth daily as needed for low blood sugar.    hydrALAZINE (APRESOLINE) 10 MG tablet Take 10 mg by mouth daily.    ibuprofen (ADVIL,MOTRIN) 200 MG tablet Take 200 mg by mouth every 6 (six) hours as needed for moderate pain.    insulin NPH-insulin regular (NOVOLIN 70/30) (70-30) 100 UNIT/ML injection Inject 30 Units into the skin 2 (two) times daily. Inject 30 units into skin 21mn before breakfast and again 362m before dinner. Pt is out of meds Qty: 10 mL, Refills: 0    omeprazole (PRILOSEC) 20 MG capsule Take 20 mg by mouth daily.    sertraline (ZOLOFT) 100 MG tablet Take 150 mg by mouth daily.       STOP taking these  medications     amLODipine (NORVASC) 5 MG tablet      isosorbide dinitrate (ISORDIL) 30 MG tablet      lisinopril (PRINIVIL,ZESTRIL) 40 MG tablet        No Known Allergies    The results of significant diagnostics from this hospitalization (including imaging, microbiology, ancillary and laboratory) are listed below for reference.    Significant Diagnostic Studies: Dg Chest 2 View  11/14/2015  CLINICAL DATA:  Hypotension and syncope for 1 day.  Previous smoker. EXAM: CHEST  2 VIEW COMPARISON:  12/10/2012 FINDINGS: Borderline heart size and pulmonary vascularity, likely normal for technique. Mild hyperinflation. No focal airspace disease or consolidation in the lungs. No blunting of costophrenic angles. No pneumothorax. Degenerative changes in the spine. IMPRESSION: No active cardiopulmonary disease. Electronically Signed   By: Lucienne Capers M.D.   On: 11/14/2015 21:18    Microbiology: Recent Results (from the past 240 hour(s))  MRSA PCR Screening     Status: None   Collection Time: 11/15/15  1:16 AM  Result Value Ref Range Status   MRSA by PCR NEGATIVE NEGATIVE Final    Comment:        The GeneXpert MRSA Assay (FDA approved for NASAL specimens only), is one component of a comprehensive MRSA colonization surveillance program. It is not intended to diagnose MRSA infection nor to guide or monitor treatment for MRSA infections.      Labs: Basic Metabolic Panel:  Recent Labs Lab 11/14/15 2011 11/15/15 0018 11/16/15 0802  NA 134* 133* 135  K 3.9 3.8 3.5  CL 97* 97* 101  CO2 '26 27 26  ' GLUCOSE 398* 285* 200*  BUN 25* 25* 19  CREATININE 2.45* 2.25* 1.48*  CALCIUM 9.0 8.8* 8.7*   Liver Function Tests:  Recent Labs Lab 11/15/15 0018 11/16/15 0802  AST 35 44*  ALT 48 53  ALKPHOS 85 74  BILITOT 0.5 0.6  PROT 7.6 8.3*  ALBUMIN 3.0* 3.1*   No results for input(s): LIPASE, AMYLASE in the last 168 hours. No results for input(s): AMMONIA in the last 168  hours. CBC:  Recent Labs Lab 11/14/15 2011 11/15/15 0018  WBC 6.4 7.2  NEUTROABS 3.8 4.5  HGB 13.8 13.5  HCT 39.6 39.5  MCV 92.5 92.7  PLT 151 146*   Cardiac Enzymes: No results for input(s): CKTOTAL, CKMB, CKMBINDEX, TROPONINI in the last 168 hours. BNP: BNP (last 3 results) No results for input(s): BNP in the last 8760 hours.  ProBNP (last 3 results) No results for input(s): PROBNP in the last 8760 hours.  CBG:  Recent Labs Lab 11/15/15 1208 11/15/15 1636 11/15/15 2133 11/16/15 0623 11/16/15 0808  GLUCAP 152* 110* 149* 191* 189*       Signed:  Nita Sells  [facility] 11/16/2015, 10:07 AM

## 2015-11-16 NOTE — Progress Notes (Deleted)
Physician Discharge Summary  Brad [name] KGM:[id number] DOB: April 16, 1961 DOA: 11/14/2015  PCP: No PCP Per Patient  Admit date: 11/14/2015 Discharge date: 11/16/2015  Time spent: 35 minutes  Recommendations for Outpatient Follow-up:  1. patient medications have been adjusted 2. Patient should take Coreg 12.5 twice a day down from dosage of 25 twice a day-this admission we have discontinued his lisinopril 40, Imdur 30, amlodipine 5, Lasix 40 3. He will need outpatient coordination and management with a new primary care physician if he does not have one 4. we have provided prescriptions forCoreg, Elavil, 7030 insulin and gabapentin as an outpatient 5. discharged home and did not need any home health 6. Will nee OP f/u recs as per ORtho regarding Bursitis of R elbow-given info for orthopedics to follow   Discharge Diagnoses:  Principal Problem:   Hypotension Active Problems:   AKI (acute kidney injury) ([place])   Diabetes mellitus type 2, controlled ([place])   Elevated lactic acid level   Discharge Condition: stable  Diet recommendation: diabetic heart healthy S2  Filed Weights   11/15/15 0524 11/15/15 1905 11/16/15 0536  Weight: 97.8 kg (215 lb 9.8 oz) 99 kg (218 lb 4.1 oz) 99 kg (218 lb 4.1 oz)    Hospital Course:   54 y/o ? HTn ? CHF recetly diagnosed by Cath ~ 3 wk pta in [place]-chart reviewed from [facility] in [place] and cardiac cath was negative H/o Hep C DM ty II AKI  Admitted with symptomatic hypotension 11/24/16patient was transiently placed in the stepdown unit and noted after 5 L of fluid resuscitation his hypotension improved and he became slightly hypertensive noted on admission he had acute kidney injury with creatinine and 2.5 range and GFR28-he was given slight fluid resuscitation and his ACE inhibitor, Lasix were discontinued His creatinine got better and GFR improved to 50 He had a lactic acidosis of 2.1 which secondary to renal failure  as he had no fever no chills He had a bilateral R>L sided elbow bursitis that was discussed with Dr. Bonney Aid patient didn't have fever ofr chills and this was not felt to be a septic process, patient was requested to f/ as per Dr.XU as an OP for this he looked well on day of discharge and was discharged home and will need follow-up as an outpatient Case manager was asked to assist with PCP and Diabetic needs  Consultations:  Telephone consulted Dr. Erlinda Hong of Ortho  Discharge Exam: Filed Vitals:   11/15/15 2231 11/16/15 0537  BP: 166/96 164/84  Pulse: 62 63  Temp:  97.7 F (36.5 C)  Resp:  16    General: alert pleasant oriented and in NAD Cardiovascular: s1 s2 no m/r/g Respiratory: clear no added sounds Elbows are swollen bilaterally with some fluid behind R elbow  Discharge Instructions    Current Discharge Medication List    START taking these medications   Details  gabapentin (NEURONTIN) 300 MG capsule Take 1 capsule (300 mg total) by mouth 2 (two) times daily. Qty: 30 capsule, Refills: 0    glucose blood test strip Use as instructed Qty: 100 each, Refills: 12    glucose monitoring kit (FREESTYLE) monitoring kit 1 each by Does not apply route as needed for other. Qty: 1 each, Refills: 0    insulin aspart protamine- aspart (NOVOLOG MIX 70/30) (70-30) 100 UNIT/ML injection Inject 0.3 mLs (30 Units total) into the skin 2 (two) times daily with a meal. Qty: 10 mL, Refills: 11  CONTINUE these medications which have CHANGED   Details  !! amitriptyline (ELAVIL) 25 MG tablet Take 1 tablet (25 mg total) by mouth every morning. Qty: 30 tablet, Refills: 0    !! amitriptyline (ELAVIL) 75 MG tablet Take 1 tablet (75 mg total) by mouth at bedtime. Qty: 30 tablet, Refills: 0    carvedilol (COREG) 12.5 MG tablet Take 1 tablet (12.5 mg total) by mouth 2 (two) times daily with a meal. Qty: 30 tablet, Refills: 0    furosemide (LASIX) 40 MG tablet Take 1 tablet (40 mg total) by  mouth every other day. Qty: 30 tablet, Refills: 0     !! - Potential duplicate medications found. Please discuss with provider.    CONTINUE these medications which have NOT CHANGED   Details  aspirin 81 MG tablet Take 81 mg by mouth daily.    atorvastatin (LIPITOR) 10 MG tablet Take 10 mg by mouth daily.    doxazosin (CARDURA) 4 MG tablet Take 4 mg by mouth daily.    glucose 4 GM chewable tablet Chew 1 tablet by mouth daily as needed for low blood sugar.    hydrALAZINE (APRESOLINE) 10 MG tablet Take 10 mg by mouth daily.    ibuprofen (ADVIL,MOTRIN) 200 MG tablet Take 200 mg by mouth every 6 (six) hours as needed for moderate pain.    insulin NPH-insulin regular (NOVOLIN 70/30) (70-30) 100 UNIT/ML injection Inject 30 Units into the skin 2 (two) times daily. Inject 30 units into skin 68mn before breakfast and again 366m before dinner. Pt is out of meds Qty: 10 mL, Refills: 0    omeprazole (PRILOSEC) 20 MG capsule Take 20 mg by mouth daily.    sertraline (ZOLOFT) 100 MG tablet Take 150 mg by mouth daily.       STOP taking these medications     amLODipine (NORVASC) 5 MG tablet      isosorbide dinitrate (ISORDIL) 30 MG tablet      lisinopril (PRINIVIL,ZESTRIL) 40 MG tablet        No Known Allergies    The results of significant diagnostics from this hospitalization (including imaging, microbiology, ancillary and laboratory) are listed below for reference.    Significant Diagnostic Studies: Dg Chest 2 View  11/14/2015  CLINICAL DATA:  Hypotension and syncope for 1 day.  Previous smoker. EXAM: CHEST  2 VIEW COMPARISON:  12/10/2012 FINDINGS: Borderline heart size and pulmonary vascularity, likely normal for technique. Mild hyperinflation. No focal airspace disease or consolidation in the lungs. No blunting of costophrenic angles. No pneumothorax. Degenerative changes in the spine. IMPRESSION: No active cardiopulmonary disease. Electronically Signed   By: WiLucienne Capers.D.    On: 11/14/2015 21:18    Microbiology: Recent Results (from the past 240 hour(s))  MRSA PCR Screening     Status: None   Collection Time: 11/15/15  1:16 AM  Result Value Ref Range Status   MRSA by PCR NEGATIVE NEGATIVE Final    Comment:        The GeneXpert MRSA Assay (FDA approved for NASAL specimens only), is one component of a comprehensive MRSA colonization surveillance program. It is not intended to diagnose MRSA infection nor to guide or monitor treatment for MRSA infections.      Labs: Basic Metabolic Panel:  Recent Labs Lab 11/14/15 2011 11/15/15 0018 11/16/15 0802  NA 134* 133* 135  K 3.9 3.8 3.5  CL 97* 97* 101  CO2 _0 GLUCOSE 398* 285* 200*  BUN 25*  25* 19  CREATININE 2.45* 2.25* 1.48*  CALCIUM 9.0 8.8* 8.7*   Liver Function Tests:  Recent Labs Lab 11/15/15 0018 11/16/15 0802  AST 35 44*  ALT 48 53  ALKPHOS 85 74  BILITOT 0.5 0.6  PROT 7.6 8.3*  ALBUMIN 3.0* 3.1*   No results for input(s): LIPASE, AMYLASE in the last 168 hours. No results for input(s): AMMONIA in the last 168 hours. CBC:  Recent Labs Lab 11/14/15 2011 11/15/15 0018  WBC 6.4 7.2  NEUTROABS 3.8 4.5  HGB 13.8 13.5  HCT 39.6 39.5  MCV 92.5 92.7  PLT 151 146*   Cardiac Enzymes: No results for input(s): CKTOTAL, CKMB, CKMBINDEX, TROPONINI in the last 168 hours. BNP: BNP (last 3 results) No results for input(s): BNP in the last 8760 hours.  ProBNP (last 3 results) No results for input(s): PROBNP in the last 8760 hours.  CBG:  Recent Labs Lab 11/15/15 1208 11/15/15 1636 11/15/15 2133 11/16/15 0623 11/16/15 0808  GLUCAP 152* 110* 149* 191* 189*       Signed:  Nita Sells  [facility] 11/16/2015, 9:20 AM

## 2015-11-16 NOTE — Progress Notes (Signed)
UR Completed Rameen Gohlke Graves-Bigelow, RN,BSN [phone]  

## 2015-11-21 ENCOUNTER — Ambulatory Visit: Payer: Self-pay | Attending: Physician Assistant | Admitting: Physician Assistant

## 2015-11-21 ENCOUNTER — Encounter (HOSPITAL_BASED_OUTPATIENT_CLINIC_OR_DEPARTMENT_OTHER): Payer: Self-pay | Admitting: Clinical

## 2015-11-21 VITALS — BP 167/105 | HR 74 | Temp 98.6°F | Resp 16 | Ht 71.0 in | Wt 222.0 lb

## 2015-11-21 DIAGNOSIS — M7021 Olecranon bursitis, right elbow: Secondary | ICD-10-CM | POA: Insufficient documentation

## 2015-11-21 DIAGNOSIS — F329 Major depressive disorder, single episode, unspecified: Secondary | ICD-10-CM

## 2015-11-21 DIAGNOSIS — N179 Acute kidney failure, unspecified: Secondary | ICD-10-CM | POA: Insufficient documentation

## 2015-11-21 DIAGNOSIS — F32A Depression, unspecified: Secondary | ICD-10-CM

## 2015-11-21 DIAGNOSIS — Z7982 Long term (current) use of aspirin: Secondary | ICD-10-CM | POA: Insufficient documentation

## 2015-11-21 DIAGNOSIS — E1149 Type 2 diabetes mellitus with other diabetic neurological complication: Secondary | ICD-10-CM | POA: Insufficient documentation

## 2015-11-21 DIAGNOSIS — I509 Heart failure, unspecified: Secondary | ICD-10-CM | POA: Insufficient documentation

## 2015-11-21 DIAGNOSIS — Z79899 Other long term (current) drug therapy: Secondary | ICD-10-CM | POA: Insufficient documentation

## 2015-11-21 DIAGNOSIS — F17200 Nicotine dependence, unspecified, uncomplicated: Secondary | ICD-10-CM | POA: Insufficient documentation

## 2015-11-21 DIAGNOSIS — Z794 Long term (current) use of insulin: Secondary | ICD-10-CM | POA: Insufficient documentation

## 2015-11-21 DIAGNOSIS — I10 Essential (primary) hypertension: Secondary | ICD-10-CM | POA: Insufficient documentation

## 2015-11-21 LAB — COMPLETE METABOLIC PANEL WITH GFR
ALT: 53 U/L — ABNORMAL HIGH (ref 9–46)
AST: 40 U/L — ABNORMAL HIGH (ref 10–35)
Albumin: 3.4 g/dL — ABNORMAL LOW (ref 3.6–5.1)
Alkaline Phosphatase: 83 U/L (ref 40–115)
BUN: 17 mg/dL (ref 7–25)
CO2: 30 mmol/L (ref 20–31)
Calcium: 8.4 mg/dL — ABNORMAL LOW (ref 8.6–10.3)
Chloride: 95 mmol/L — ABNORMAL LOW (ref 98–110)
Creat: 1.42 mg/dL — ABNORMAL HIGH (ref 0.70–1.33)
GFR, Est African American: 64 mL/min (ref >=60)
GFR, Est Non African American: 55 mL/min — ABNORMAL LOW (ref >=60)
Glucose, Bld: 276 mg/dL — ABNORMAL HIGH (ref 65–99)
Potassium: 3.6 mmol/L (ref 3.5–5.3)
Sodium: 136 mmol/L (ref 135–146)
Total Bilirubin: 0.7 mg/dL (ref 0.2–1.2)
Total Protein: 7.9 g/dL (ref 6.1–8.1)

## 2015-11-21 LAB — POCT GLYCOSYLATED HEMOGLOBIN (HGB A1C): Hemoglobin A1C: 9.5

## 2015-11-21 LAB — GLUCOSE, POCT (MANUAL RESULT ENTRY): POC Glucose: 318 mg/dl — AB (ref 70–99)

## 2015-11-21 MED ORDER — AMITRIPTYLINE HCL 25 MG PO TABS: 25.0000 mg | ORAL_TABLET | Freq: Every morning | ORAL | Status: DC

## 2015-11-21 MED ORDER — INSULIN ASPART PROT & ASPART (70-30 MIX) 100 UNIT/ML ~~LOC~~ SUSP: 35.0000 [IU] | Freq: Two times a day (BID) | SUBCUTANEOUS | Status: DC

## 2015-11-21 MED ORDER — SERTRALINE HCL 100 MG PO TABS: 150.0000 mg | ORAL_TABLET | Freq: Every day | ORAL | Status: DC

## 2015-11-21 MED ORDER — OMEPRAZOLE 20 MG PO CPDR: 20.0000 mg | DELAYED_RELEASE_CAPSULE | Freq: Every day | ORAL | Status: DC

## 2015-11-21 MED ORDER — GABAPENTIN 300 MG PO CAPS: 300.0000 mg | ORAL_CAPSULE | Freq: Three times a day (TID) | ORAL | Status: DC

## 2015-11-21 MED ORDER — AMITRIPTYLINE HCL 75 MG PO TABS: 75.0000 mg | ORAL_TABLET | Freq: Every day | ORAL | Status: DC

## 2015-11-21 MED ORDER — ATORVASTATIN CALCIUM 10 MG PO TABS
10.0000 mg | ORAL_TABLET | Freq: Every day | ORAL | Status: DC
Start: 2015-11-21 — End: 2016-06-28

## 2015-11-21 MED ORDER — HYDRALAZINE HCL 50 MG PO TABS: 50.0000 mg | ORAL_TABLET | Freq: Two times a day (BID) | ORAL | Status: DC

## 2015-11-21 MED ORDER — FUROSEMIDE 40 MG PO TABS: 40.0000 mg | ORAL_TABLET | ORAL | Status: DC

## 2015-11-21 MED ORDER — CARVEDILOL 12.5 MG PO TABS: 12.5000 mg | ORAL_TABLET | Freq: Two times a day (BID) | ORAL | Status: DC
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
## 2015-11-21 NOTE — Progress Notes (Signed)
Pt's here for low blood pressure due to too many HTN medication.  Pt reports having pain in both feet due to neuropathy. Describes as pin and needles. Rated 8/10.  Pt looking to est. care with PCP  Pt reports taken meds today.

## 2015-11-21 NOTE — Progress Notes (Signed)
ASSESSMENT: Pt currently experiencing symptoms of depression. He needs to f/u with PCP and [facility]; would benefit from psychoeducation and supportive counseling regarding coping with symptoms of depression, as well as appropriate community resources.  Stage of Change: contemplative  PLAN: 1. F/U with behavioral health consultant in as needed 2. Psychiatric Medications: elavil, zoloft. 3. Behavioral recommendation(s):   -Consider [employer] of [employer]) program, as backup job plan -Consider reading educational material regarding coping with symptoms of depression.  -Consider Hospice counseling for grief, as needed SUBJECTIVE: Pt. referred by Zettie Pho for Symptoms of depression:  Pt. reports the following symptoms/concerns: Pt states that between 2006-2011, numerous deaths in his family including his son, stepmom, mom, and nephew. In April 16, 2010, after son's death, he began drinking and "did something stupid" and went to [facility] prison, was released 2 weeks ago, currently staying at [facility]. 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  Duration of problem: 2 weeks Severity: moderate  OBJECTIVE: Orientation & Cognition: Oriented x3. Thought processes normal and appropriate to situation. Mood: appropriate. Affect: appropriate Appearance: appropriate Risk of harm to self or others: no known risk of harm to self or others Substance use: tobacco Assessments administered: PHQ9: 13/ PHQ: 4  Diagnosis: Depression CPT Code: F32.9 -------------------------------------------- Other(s) present in the room: none  Time spent with patient in exam room: 16 minutes

## 2015-11-29 ENCOUNTER — Ambulatory Visit: Payer: Self-pay | Attending: Internal Medicine

## 2015-12-12 ENCOUNTER — Other Ambulatory Visit: Payer: Self-pay | Admitting: Internal Medicine

## 2015-12-12 MED ORDER — INSULIN ASPART PROT & ASPART (70-30 MIX) 100 UNIT/ML ~~LOC~~ SUSP: 35.0000 [IU] | Freq: Two times a day (BID) | SUBCUTANEOUS | Status: DC

## 2015-12-13 ENCOUNTER — Ambulatory Visit: Payer: Self-pay | Attending: Internal Medicine | Admitting: Internal Medicine

## 2015-12-13 ENCOUNTER — Encounter: Payer: Self-pay | Admitting: Pharmacist

## 2015-12-13 ENCOUNTER — Encounter: Payer: Self-pay | Admitting: Internal Medicine

## 2015-12-13 VITALS — BP 160/97 | HR 85 | Temp 98.0°F | Resp 17 | Ht 71.0 in | Wt 227.0 lb

## 2015-12-13 DIAGNOSIS — Z7982 Long term (current) use of aspirin: Secondary | ICD-10-CM | POA: Insufficient documentation

## 2015-12-13 DIAGNOSIS — I129 Hypertensive chronic kidney disease with stage 1 through stage 4 chronic kidney disease, or unspecified chronic kidney disease: Secondary | ICD-10-CM | POA: Insufficient documentation

## 2015-12-13 DIAGNOSIS — I509 Heart failure, unspecified: Secondary | ICD-10-CM | POA: Insufficient documentation

## 2015-12-13 DIAGNOSIS — N189 Chronic kidney disease, unspecified: Secondary | ICD-10-CM | POA: Insufficient documentation

## 2015-12-13 DIAGNOSIS — Z79899 Other long term (current) drug therapy: Secondary | ICD-10-CM | POA: Insufficient documentation

## 2015-12-13 DIAGNOSIS — I10 Essential (primary) hypertension: Secondary | ICD-10-CM

## 2015-12-13 DIAGNOSIS — E119 Type 2 diabetes mellitus without complications: Secondary | ICD-10-CM | POA: Insufficient documentation

## 2015-12-13 DIAGNOSIS — F17210 Nicotine dependence, cigarettes, uncomplicated: Secondary | ICD-10-CM | POA: Insufficient documentation

## 2015-12-13 DIAGNOSIS — K219 Gastro-esophageal reflux disease without esophagitis: Secondary | ICD-10-CM | POA: Insufficient documentation

## 2015-12-13 DIAGNOSIS — Z794 Long term (current) use of insulin: Secondary | ICD-10-CM | POA: Insufficient documentation

## 2015-12-13 LAB — GLUCOSE, POCT (MANUAL RESULT ENTRY): POC Glucose: 287 mg/dl — AB (ref 70–99)

## 2015-12-13 MED ORDER — INSULIN ASPART PROT & ASPART (70-30 MIX) 100 UNIT/ML ~~LOC~~ SUSP: SUBCUTANEOUS | Status: DC

## 2015-12-13 MED ORDER — GLUCOSE BLOOD VI STRP: ORAL_STRIP | Status: DC

## 2015-12-13 MED ORDER — INSULIN ASPART 100 UNIT/ML ~~LOC~~ SOLN
10.0000 [IU] | Freq: Once | SUBCUTANEOUS | Status: AC
  Administered 2015-12-13: 10 [IU] via SUBCUTANEOUS

## 2015-12-13 MED ORDER — DIGITAL GLASS SCALE MISC: Status: DC

## 2015-12-13 NOTE — Patient Instructions (Signed)
If you pick up greater than 2 pounds in one day or more than 5 pounds in one week then you need to call me. This means you are retaining fluid and need to take your Furosemide/Lasix to pull off fluid.  Please weight yourself every morning as soon as you wake up for a accurate weight.   I am referring you to a Endocrinologist---diabetes specialist and a cardiologist for your congestive heart failure.
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
## 2015-12-13 NOTE — Progress Notes (Signed)
Patient here for follow up on his diabetes Patient states his blood sugars have been "all over the place" Also requesting refills on his medications

## 2015-12-13 NOTE — Progress Notes (Signed)
[name] is a patient who has not been established in our clinic. He is out of his insulin as he has been taking a dose higher than what was on the prescription at the pharmacy. The pharmacy will not refill it because it is too early. He wants a new prescription with the updated insulin dose.  Since patient is not established, I cannot see him under my CPP protocol. Chari Manning, NP, agreed to see him today at 4pm. Patient verbalized understanding but reported that he lived in a half way house and has to be back by 3:50 pm. I stressed the importance of being seen to get his insulin so patient will call and try to get permission to stay for visit.   Nicoletta Ba, PharmD, BCPS, [facility] [phone]

## 2015-12-17 ENCOUNTER — Ambulatory Visit (HOSPITAL_COMMUNITY)
Admission: RE | Admit: 2015-12-17 | Discharge: 2015-12-17 | Disposition: A | Discharge status: Home / Self Care | Payer: Self-pay | Source: Ambulatory Visit | Attending: Internal Medicine | Admitting: Internal Medicine

## 2015-12-17 DIAGNOSIS — I509 Heart failure, unspecified: Secondary | ICD-10-CM | POA: Insufficient documentation

## 2015-12-17 DIAGNOSIS — E119 Type 2 diabetes mellitus without complications: Secondary | ICD-10-CM | POA: Insufficient documentation

## 2015-12-17 DIAGNOSIS — F17200 Nicotine dependence, unspecified, uncomplicated: Secondary | ICD-10-CM | POA: Insufficient documentation

## 2015-12-17 DIAGNOSIS — I340 Nonrheumatic mitral (valve) insufficiency: Secondary | ICD-10-CM | POA: Insufficient documentation

## 2015-12-17 DIAGNOSIS — I10 Essential (primary) hypertension: Secondary | ICD-10-CM | POA: Insufficient documentation

## 2015-12-17 DIAGNOSIS — I517 Cardiomegaly: Secondary | ICD-10-CM | POA: Insufficient documentation

## 2015-12-17 NOTE — Progress Notes (Signed)
  Echocardiogram 2D Echocardiogram has been performed.  Brad [name] 12/17/2015, 9:46 AM

## 2015-12-19 ENCOUNTER — Telehealth: Payer: Self-pay

## 2015-12-19 NOTE — Telephone Encounter (Signed)
Contacted patient at [facility] Patient is aware of his echo results and understands that he Will need to follow up with cardiology

## 2015-12-19 NOTE — Telephone Encounter (Signed)
-----   Message from Lance Bosch, NP sent at 12/19/2015 12:39 PM EST ----- Last EF on Echo was 35-40% I do not have a old Echo to compare. Once he get sin with cardiology they will explain more and develop a plan for patient

## 2015-12-20 ENCOUNTER — Ambulatory Visit: Payer: Self-pay | Attending: Internal Medicine | Admitting: Pharmacist

## 2015-12-20 VITALS — BP 106/69 | HR 70

## 2015-12-20 DIAGNOSIS — Z794 Long term (current) use of insulin: Secondary | ICD-10-CM | POA: Insufficient documentation

## 2015-12-20 DIAGNOSIS — E119 Type 2 diabetes mellitus without complications: Secondary | ICD-10-CM | POA: Insufficient documentation

## 2015-12-20 LAB — GLUCOSE, POCT (MANUAL RESULT ENTRY)
POC Glucose: 343 mg/dl — AB (ref 70–99)
POC Glucose: 278 mg/dl — AB (ref 70–99)

## 2015-12-20 MED ORDER — INSULIN ASPART 100 UNIT/ML ~~LOC~~ SOLN
10.0000 [IU] | Freq: Once | SUBCUTANEOUS | Status: AC
  Administered 2015-12-20: 10 [IU] via SUBCUTANEOUS

## 2015-12-20 NOTE — Progress Notes (Signed)
S:    Patient arrives in good spirits.  Presents for diabetes follow up.  Patient reports adherence with medications. Current diabetes medications include Novolog Mix 70/30 75 units in the morning and 40 units at night.  Patient reports hypoglycemic events. He is not really sure what time of day they all occurred but at least one of them occurred at lunch time before he ate.   Patient reported dietary habits: has to eat what he is given  Patient reported exercise habits: none   Patient reports nocturia.  Patient reports neuropathy. Patient denies visual changes. Patient reports self foot exams.   Patient reports that he established contact with his family again over Christmas. He also got a job working at [employer] last week. He is very happy right now.   O:  Lab Results  Component Value Date   HGBA1C 9.50 11/21/2015    Home fasting CBG: 200s 2 hour post-prandial/random CBG: 61-200s  POCT glucose 343;278  A/P: Diabetes currently uncontrolled based on A1c of 9.5.   Patient reports hypoglycemic events and is able to verbalize appropriate hypoglycemia management plan.  Patient reports adherence with medication. Control is suboptimal due to inadequate insulin dosing and sedentary lifestyle.  Administered Novolog 10 units x 1.   Continue Novolog Mix 70/30 75 units in the morning and 40 units at night. Though he is not controlled and has hyperglycemia in clinic today, he also has had hypoglycemia and without his meter or a logbook, I am unable to really determine when it occurred so I am hesitant to adjust his insulin right now. Will defer to Dr. Loanne Drilling with Endocrinology. Patient has appointment with him next week. Instructed patient to take his log or the meter to the visit.  Hypertension controlled on hydralazine 50 mg BID, furosemide 40, and carvedilol 12.5 mg BID. Patient following up with cardiology next week so will defer to them to optimize medications due to diagnosis of  systolic heart failure.    Next A1C anticipated March 2017.    Written patient instructions provided.  Total time in face to face counseling 35 minutes.  Follow up in Pharmacist Clinic Visit as needed, next visit for diabetes management is with Endocrinology 12/27/15.

## 2015-12-20 NOTE — Patient Instructions (Signed)
Thank you for coming in to see me!  Your blood pressure looks great.  Follow up with endocrinology next week. Take your log book or meter so Dr. Loanne Drilling can see your blood sugars!

## 2015-12-25 ENCOUNTER — Ambulatory Visit: Payer: Self-pay | Admitting: Cardiovascular Disease

## 2015-12-25 MED FILL — GABAPENTIN 300 MG CAPSULE: 300 | 10 days supply | Qty: 30 | Fill #2

## 2015-12-25 MED FILL — ?AMITRIPTYLINE HCL 25 MG TA: 25 | 30 days supply | Qty: 90 | Fill #0

## 2015-12-27 ENCOUNTER — Encounter: Payer: Self-pay | Admitting: Endocrinology

## 2015-12-27 ENCOUNTER — Ambulatory Visit (INDEPENDENT_AMBULATORY_CARE_PROVIDER_SITE_OTHER): Payer: Self-pay | Admitting: Endocrinology

## 2015-12-27 ENCOUNTER — Other Ambulatory Visit: Payer: Self-pay | Admitting: Pharmacist

## 2015-12-27 ENCOUNTER — Other Ambulatory Visit: Payer: Self-pay | Admitting: Physician Assistant

## 2015-12-27 ENCOUNTER — Other Ambulatory Visit: Payer: Self-pay | Admitting: Internal Medicine

## 2015-12-27 VITALS — BP 162/88 | HR 88 | Temp 97.7°F | Ht 71.0 in | Wt 228.0 lb

## 2015-12-27 DIAGNOSIS — E1149 Type 2 diabetes mellitus with other diabetic neurological complication: Secondary | ICD-10-CM

## 2015-12-27 MED ORDER — "INSULIN SYRINGE 29G X 1/2"" 0.5 ML MISC": Status: DC

## 2015-12-27 MED ORDER — "INSULIN SYRINGE 29G X 1/2"" 1 ML MISC": Status: DC

## 2015-12-27 MED FILL — ULTICARE INS SYR 1 ML 29GX1: 29G X 1/2" | 25 days supply | Qty: 100 | Fill #0
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
## 2015-12-27 NOTE — Patient Instructions (Signed)
good diet and exercise significantly improve the control of your diabetes.  please let me know if you wish to be referred to a dietician.  high blood sugar is very risky to your health.  you should see an eye doctor and dentist every year.  It is very important to get all recommended vaccinations.  controlling your blood pressure and cholesterol drastically reduces the damage diabetes does to your body.  Those who smoke should quit.  please discuss these with your doctor.  check your blood sugar twice a day.  vary the time of day when you check, between before the 3 meals, and at bedtime.  also check if you have symptoms of your blood sugar being too high or too low.  please keep a record of the readings and bring it to your next appointment here (or you can bring the meter itself).  You can write it on any piece of paper.  please call us sooner if your blood sugar goes below 70, or if you have a lot of readings over 200. On this type of insulin schedule, you should eat meals on a regular schedule.  If a meal is missed or significantly delayed, your blood sugar could go low. blood tests are requested for you today.  We'll let you know about the results.   Please come back for a follow-up appointment in 2 months.

## 2015-12-31 ENCOUNTER — Other Ambulatory Visit: Payer: Self-pay

## 2015-12-31 DIAGNOSIS — E119 Type 2 diabetes mellitus without complications: Secondary | ICD-10-CM

## 2015-12-31 DIAGNOSIS — Z794 Long term (current) use of insulin: Secondary | ICD-10-CM

## 2015-12-31 LAB — FRUCTOSAMINE: Fructosamine: 441 umol/L — ABNORMAL HIGH (ref 190–270)

## 2015-12-31 MED ORDER — INSULIN ASPART PROT & ASPART (70-30 MIX) 100 UNIT/ML ~~LOC~~ SUSP: SUBCUTANEOUS | Status: DC

## 2015-12-31 MED FILL — !NOVOLOG MIX 70/30 VIAL: 70-30/ML | 42 days supply | Qty: 50 | Fill #0

## 2016-01-03 ENCOUNTER — Other Ambulatory Visit: Payer: Self-pay

## 2016-01-03 ENCOUNTER — Ambulatory Visit: Payer: Self-pay | Attending: Internal Medicine | Admitting: Internal Medicine

## 2016-01-03 ENCOUNTER — Encounter: Payer: Self-pay | Admitting: Internal Medicine

## 2016-01-03 VITALS — BP 159/91 | HR 81 | Temp 98.0°F | Resp 17 | Ht 71.0 in | Wt 232.0 lb

## 2016-01-03 DIAGNOSIS — R0789 Other chest pain: Secondary | ICD-10-CM | POA: Insufficient documentation

## 2016-01-03 DIAGNOSIS — I509 Heart failure, unspecified: Secondary | ICD-10-CM | POA: Insufficient documentation

## 2016-01-03 DIAGNOSIS — K219 Gastro-esophageal reflux disease without esophagitis: Secondary | ICD-10-CM | POA: Insufficient documentation

## 2016-01-03 DIAGNOSIS — F17210 Nicotine dependence, cigarettes, uncomplicated: Secondary | ICD-10-CM | POA: Insufficient documentation

## 2016-01-03 DIAGNOSIS — Z87898 Personal history of other specified conditions: Secondary | ICD-10-CM | POA: Insufficient documentation

## 2016-01-03 DIAGNOSIS — Z794 Long term (current) use of insulin: Secondary | ICD-10-CM | POA: Insufficient documentation

## 2016-01-03 DIAGNOSIS — Z79899 Other long term (current) drug therapy: Secondary | ICD-10-CM | POA: Insufficient documentation

## 2016-01-03 DIAGNOSIS — B1920 Unspecified viral hepatitis C without hepatic coma: Secondary | ICD-10-CM | POA: Insufficient documentation

## 2016-01-03 DIAGNOSIS — M79669 Pain in unspecified lower leg: Secondary | ICD-10-CM | POA: Insufficient documentation

## 2016-01-03 DIAGNOSIS — E119 Type 2 diabetes mellitus without complications: Secondary | ICD-10-CM | POA: Insufficient documentation

## 2016-01-03 DIAGNOSIS — I110 Hypertensive heart disease with heart failure: Secondary | ICD-10-CM | POA: Insufficient documentation

## 2016-01-03 DIAGNOSIS — Z888 Allergy status to other drugs, medicaments and biological substances status: Secondary | ICD-10-CM | POA: Insufficient documentation

## 2016-01-03 DIAGNOSIS — L309 Dermatitis, unspecified: Secondary | ICD-10-CM | POA: Insufficient documentation

## 2016-01-03 DIAGNOSIS — Z7982 Long term (current) use of aspirin: Secondary | ICD-10-CM | POA: Insufficient documentation

## 2016-01-03 MED ORDER — POTASSIUM CHLORIDE CRYS ER 20 MEQ PO TBCR: 20.0000 meq | EXTENDED_RELEASE_TABLET | Freq: Every day | ORAL | Status: DC

## 2016-01-03 MED ORDER — TRIAMCINOLONE ACETONIDE 0.1 % EX CREA: 1.0000 "application " | TOPICAL_CREAM | Freq: Two times a day (BID) | CUTANEOUS | Status: DC

## 2016-01-03 MED FILL — POTASSIUM CL ER 20 MEQ TAB: 20 | 30 days supply | Qty: 30 | Fill #0

## 2016-01-03 MED FILL — TRIAMCINOLONE 0.1% CREAM: 0.1 | 14 days supply | Qty: 30 | Fill #0

## 2016-01-03 NOTE — Progress Notes (Signed)
Patient complains of bilateral leg swelling that started about two weeks ago Patient also complains of having some pain and discomfort to the right side Of his chest for the past week
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
## 2016-01-04 ENCOUNTER — Ambulatory Visit: Payer: Self-pay | Attending: Internal Medicine

## 2016-01-04 DIAGNOSIS — B1920 Unspecified viral hepatitis C without hepatic coma: Secondary | ICD-10-CM

## 2016-01-04 DIAGNOSIS — R0789 Other chest pain: Secondary | ICD-10-CM

## 2016-01-04 DIAGNOSIS — I509 Heart failure, unspecified: Secondary | ICD-10-CM

## 2016-01-04 LAB — BASIC METABOLIC PANEL
BUN: 20 mg/dL (ref 7–25)
CO2: 29 mmol/L (ref 20–31)
Calcium: 9 mg/dL (ref 8.6–10.3)
Chloride: 99 mmol/L (ref 98–110)
Creat: 1.75 mg/dL — ABNORMAL HIGH (ref 0.70–1.33)
Glucose, Bld: 370 mg/dL — ABNORMAL HIGH (ref 65–99)
Potassium: 3.9 mmol/L (ref 3.5–5.3)
Sodium: 136 mmol/L (ref 135–146)

## 2016-01-04 LAB — LIPID PANEL
Cholesterol: 116 mg/dL — ABNORMAL LOW (ref 125–200)
HDL: 31 mg/dL — ABNORMAL LOW
LDL Cholesterol: 58 mg/dL
Total CHOL/HDL Ratio: 3.7 ratio
Triglycerides: 134 mg/dL
VLDL: 27 mg/dL

## 2016-01-07 LAB — HEPATITIS C RNA QUANTITATIVE
HCV Quantitative Log: 6.36 {Log} — ABNORMAL HIGH (ref <1.18)
HCV Quantitative: 2290161 IU/mL — ABNORMAL HIGH (ref <15)

## 2016-01-08 ENCOUNTER — Telehealth: Payer: Self-pay

## 2016-01-08 NOTE — Telephone Encounter (Signed)
Spoke with patient this am  Patient is aware of his good cholesterol results Patient has not yet heard from infectious disease and will work  On getting better control of his diabetes and blood pressure

## 2016-01-08 NOTE — Telephone Encounter (Signed)
-----   Message from Lance Bosch, NP sent at 01/08/2016 12:03 PM EST ----- Cholesterol looks great. He has a high viral load of Hepatitis C, he should be hearing from ID very soon Kidney function has taken a toll. The sooner we get his diabetes/BP controlled the better for his kidneys. I will recheck kidneys in 3 months.

## 2016-01-09 ENCOUNTER — Telehealth: Payer: Self-pay

## 2016-01-09 ENCOUNTER — Telehealth: Payer: Self-pay | Admitting: Internal Medicine

## 2016-01-09 ENCOUNTER — Ambulatory Visit: Payer: Self-pay | Admitting: Cardiovascular Disease

## 2016-01-09 NOTE — Telephone Encounter (Signed)
Returned patient phone call Patient not available Message left on voice mail to return our call 

## 2016-01-09 NOTE — Telephone Encounter (Signed)
Pt. Returned call. Please f/u with pt. °

## 2016-01-11 ENCOUNTER — Telehealth: Payer: Self-pay

## 2016-01-11 NOTE — Telephone Encounter (Signed)
Patient returning nurse phone call. Patient states he's available between 9 and 3:30

## 2016-01-11 NOTE — Telephone Encounter (Signed)
Returned patient phone call and he has not heard from ID Call was transferred to the referral coordinator

## 2016-01-17 ENCOUNTER — Other Ambulatory Visit: Payer: Self-pay

## 2016-01-22 ENCOUNTER — Other Ambulatory Visit: Payer: Self-pay

## 2016-01-22 DIAGNOSIS — B182 Chronic viral hepatitis C: Secondary | ICD-10-CM

## 2016-01-22 LAB — IRON: Iron: 70 ug/dL (ref 50–180)

## 2016-01-23 LAB — PROTIME-INR
INR: 1.08 (ref <1.50)
Prothrombin Time: 14.1 seconds (ref 11.6–15.2)

## 2016-01-23 LAB — HEPATITIS A ANTIBODY, TOTAL: Hep A Total Ab: NONREACTIVE

## 2016-01-23 LAB — HEPATITIS B SURFACE ANTIBODY,QUALITATIVE: Hep B S Ab: NEGATIVE

## 2016-01-23 LAB — ANA: Anti Nuclear Antibody(ANA): NEGATIVE

## 2016-01-23 LAB — HIV ANTIBODY (ROUTINE TESTING W REFLEX): HIV 1&2 Ab, 4th Generation: NONREACTIVE

## 2016-01-23 LAB — HEPATITIS B CORE ANTIBODY, TOTAL: Hep B Core Total Ab: NONREACTIVE

## 2016-01-23 LAB — HEPATITIS B SURFACE ANTIGEN: Hepatitis B Surface Ag: NEGATIVE

## 2016-01-25 LAB — HCV RNA,LIPA RFLX NS5A DRUG RESIST

## 2016-01-25 MED FILL — GABAPENTIN 300 MG CAPSULE: 300 | 30 days supply | Qty: 90 | Fill #0

## 2016-01-25 MED FILL — ?AMITRIPTYLINE HCL 25 MG TA: 25 | 30 days supply | Qty: 90 | Fill #1

## 2016-01-27 LAB — HCV RNA NS5A DRUG RESISTANCE

## 2016-01-28 MED FILL — !NOVOLOG MIX 70/30 VIAL: 70-30/ML | 42 days supply | Qty: 50 | Fill #1

## 2016-01-31 ENCOUNTER — Ambulatory Visit (INDEPENDENT_AMBULATORY_CARE_PROVIDER_SITE_OTHER): Payer: Self-pay | Admitting: Internal Medicine

## 2016-01-31 ENCOUNTER — Encounter: Payer: Self-pay | Admitting: Internal Medicine

## 2016-01-31 ENCOUNTER — Telehealth: Payer: Self-pay | Admitting: Internal Medicine

## 2016-01-31 VITALS — BP 130/86 | HR 78

## 2016-01-31 DIAGNOSIS — I10 Essential (primary) hypertension: Secondary | ICD-10-CM

## 2016-01-31 DIAGNOSIS — F1990 Other psychoactive substance use, unspecified, uncomplicated: Secondary | ICD-10-CM

## 2016-01-31 DIAGNOSIS — E1149 Type 2 diabetes mellitus with other diabetic neurological complication: Secondary | ICD-10-CM

## 2016-01-31 DIAGNOSIS — I429 Cardiomyopathy, unspecified: Secondary | ICD-10-CM

## 2016-01-31 DIAGNOSIS — F1911 Other psychoactive substance abuse, in remission: Secondary | ICD-10-CM

## 2016-01-31 DIAGNOSIS — Z720 Tobacco use: Secondary | ICD-10-CM

## 2016-01-31 DIAGNOSIS — I428 Other cardiomyopathies: Secondary | ICD-10-CM

## 2016-01-31 DIAGNOSIS — I5032 Chronic diastolic (congestive) heart failure: Secondary | ICD-10-CM | POA: Insufficient documentation

## 2016-01-31 DIAGNOSIS — I5022 Chronic systolic (congestive) heart failure: Secondary | ICD-10-CM

## 2016-01-31 MED ORDER — SILDENAFIL CITRATE 20 MG PO TABS: ORAL_TABLET | ORAL | Status: DC

## 2016-01-31 NOTE — Patient Instructions (Signed)
Dr Debara Pickett recommends that you schedule a follow-up appointment in 3 months.  If you need a refill on your cardiac medications before your next appointment, please call your pharmacy.

## 2016-01-31 NOTE — Telephone Encounter (Signed)
Refill sent.
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
## 2016-01-31 NOTE — Telephone Encounter (Signed)
New message     Patient was seen today. Please call in sildenafil 20mg  to [facility] in [place].  He said he want 50 tablets.  He did not get the presc written today filled. Their fax number is [phone].  Pt states that he can buy any amount of the medication he needs but need a presc for 50 called in to get the cheaper price.  If there is a problem, call pt

## 2016-02-04 ENCOUNTER — Telehealth: Payer: Self-pay

## 2016-02-04 NOTE — Telephone Encounter (Signed)
Patient recently seen by Dr Debara Pickett. [facility] requested a cath report from [place]. Called [facility] to inquire which hospital patient was transported to. After being transferred several times, was finally given a hospital name.   [facility] in [place], [place] to request records.  Will fax records release to [facility] at [phone].

## 2016-02-05 ENCOUNTER — Encounter: Payer: Self-pay | Admitting: *Deleted

## 2016-02-06 ENCOUNTER — Other Ambulatory Visit: Payer: Self-pay | Admitting: Internal Medicine

## 2016-02-06 DIAGNOSIS — E119 Type 2 diabetes mellitus without complications: Secondary | ICD-10-CM

## 2016-02-06 DIAGNOSIS — Z794 Long term (current) use of insulin: Secondary | ICD-10-CM

## 2016-02-06 MED ORDER — INSULIN ASPART PROT & ASPART (70-30 MIX) 100 UNIT/ML ~~LOC~~ SUSP: SUBCUTANEOUS | Status: DC

## 2016-02-08 ENCOUNTER — Telehealth: Payer: Self-pay | Admitting: Internal Medicine

## 2016-02-08 ENCOUNTER — Encounter: Payer: Self-pay | Admitting: Internal Medicine

## 2016-02-08 ENCOUNTER — Telehealth: Payer: Self-pay

## 2016-02-08 NOTE — Telephone Encounter (Signed)
Faxed signed release to [facility] [place] - to obtain records per Dr [facility] request. Faxed on 02/08/2016.

## 2016-02-08 NOTE — Telephone Encounter (Signed)
02/08/2016 Received records packet from [facility] for upcoming appointment on 05/02/2016 with Dr. Debara Pickett.

## 2016-02-26 ENCOUNTER — Ambulatory Visit: Payer: Self-pay | Admitting: Endocrinology

## 2016-02-27 ENCOUNTER — Encounter: Payer: Self-pay | Admitting: Internal Medicine

## 2016-03-03 MED FILL — ?AMITRIPTYLINE HCL 25 MG TA: 25 | 30 days supply | Qty: 90 | Fill #2

## 2016-03-03 MED FILL — GABAPENTIN 300 MG CAPSULE: 300 | 30 days supply | Qty: 90 | Fill #1

## 2016-03-10 MED FILL — $NOVOLOG MIX 70/30 VIAL: (70-30) 100 | 42 days supply | Qty: 50 | Fill #2

## 2016-03-11 ENCOUNTER — Encounter: Payer: Self-pay | Admitting: Endocrinology

## 2016-03-11 ENCOUNTER — Ambulatory Visit (INDEPENDENT_AMBULATORY_CARE_PROVIDER_SITE_OTHER): Payer: Self-pay | Admitting: Endocrinology

## 2016-03-11 VITALS — BP 156/96 | HR 78 | Temp 98.0°F | Ht 71.0 in | Wt 218.0 lb

## 2016-03-11 DIAGNOSIS — E1122 Type 2 diabetes mellitus with diabetic chronic kidney disease: Secondary | ICD-10-CM

## 2016-03-11 DIAGNOSIS — N183 Chronic kidney disease, stage 3 unspecified: Secondary | ICD-10-CM

## 2016-03-11 DIAGNOSIS — Z794 Long term (current) use of insulin: Secondary | ICD-10-CM

## 2016-03-11 DIAGNOSIS — E1149 Type 2 diabetes mellitus with other diabetic neurological complication: Secondary | ICD-10-CM

## 2016-03-11 LAB — POCT GLYCOSYLATED HEMOGLOBIN (HGB A1C): Hemoglobin A1C: 9.1

## 2016-03-11 MED ORDER — INSULIN GLARGINE 100 UNIT/ML SOLOSTAR PEN: 110.0000 [IU] | PEN_INJECTOR | SUBCUTANEOUS | Status: DC

## 2016-03-11 NOTE — Patient Instructions (Addendum)
check your blood sugar twice a day.  vary the time of day when you check, between before the 3 meals, and at bedtime.  also check if you have symptoms of your blood sugar being too high or too low.  please keep a record of the readings and bring it to your next appointment here (or you can bring the meter itself).  You can write it on any piece of paper.  please call us sooner if your blood sugar goes below 70, or if you have a lot of readings over 200. On this type of insulin schedule, you should eat meals on a regular schedule.  If a meal is missed or significantly delayed, your blood sugar could go low. Please change the insulin to lantus, 110 units each morning.  i have sent a prescription to your pharmacy.  Please come back for a follow-up appointment in 2 weeks.
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
## 2016-03-13 DIAGNOSIS — E119 Type 2 diabetes mellitus without complications: Secondary | ICD-10-CM | POA: Insufficient documentation

## 2016-03-17 ENCOUNTER — Ambulatory Visit: Payer: Self-pay | Attending: Internal Medicine | Admitting: Internal Medicine

## 2016-03-17 VITALS — BP 156/100 | HR 94 | Temp 98.0°F | Resp 16

## 2016-03-17 DIAGNOSIS — F17210 Nicotine dependence, cigarettes, uncomplicated: Secondary | ICD-10-CM | POA: Insufficient documentation

## 2016-03-17 DIAGNOSIS — I509 Heart failure, unspecified: Secondary | ICD-10-CM | POA: Insufficient documentation

## 2016-03-17 DIAGNOSIS — E1129 Type 2 diabetes mellitus with other diabetic kidney complication: Secondary | ICD-10-CM

## 2016-03-17 DIAGNOSIS — I10 Essential (primary) hypertension: Secondary | ICD-10-CM | POA: Insufficient documentation

## 2016-03-17 DIAGNOSIS — K219 Gastro-esophageal reflux disease without esophagitis: Secondary | ICD-10-CM | POA: Insufficient documentation

## 2016-03-17 DIAGNOSIS — Z79899 Other long term (current) drug therapy: Secondary | ICD-10-CM | POA: Insufficient documentation

## 2016-03-17 DIAGNOSIS — Z7982 Long term (current) use of aspirin: Secondary | ICD-10-CM | POA: Insufficient documentation

## 2016-03-17 DIAGNOSIS — E1165 Type 2 diabetes mellitus with hyperglycemia: Secondary | ICD-10-CM | POA: Insufficient documentation

## 2016-03-17 DIAGNOSIS — Z888 Allergy status to other drugs, medicaments and biological substances status: Secondary | ICD-10-CM | POA: Insufficient documentation

## 2016-03-17 DIAGNOSIS — Z87898 Personal history of other specified conditions: Secondary | ICD-10-CM | POA: Insufficient documentation

## 2016-03-17 DIAGNOSIS — K0889 Other specified disorders of teeth and supporting structures: Secondary | ICD-10-CM | POA: Insufficient documentation

## 2016-03-17 DIAGNOSIS — L409 Psoriasis, unspecified: Secondary | ICD-10-CM | POA: Insufficient documentation

## 2016-03-17 DIAGNOSIS — R21 Rash and other nonspecific skin eruption: Secondary | ICD-10-CM | POA: Insufficient documentation

## 2016-03-17 DIAGNOSIS — IMO0002 Reserved for concepts with insufficient information to code with codable children: Secondary | ICD-10-CM

## 2016-03-17 DIAGNOSIS — K029 Dental caries, unspecified: Secondary | ICD-10-CM | POA: Insufficient documentation

## 2016-03-17 DIAGNOSIS — B1920 Unspecified viral hepatitis C without hepatic coma: Secondary | ICD-10-CM | POA: Insufficient documentation

## 2016-03-17 DIAGNOSIS — Z794 Long term (current) use of insulin: Secondary | ICD-10-CM | POA: Insufficient documentation

## 2016-03-17 LAB — GLUCOSE, POCT (MANUAL RESULT ENTRY): POC Glucose: 287 mg/dl — AB (ref 70–99)

## 2016-03-17 MED ORDER — MOMETASONE FUROATE 0.1 % EX OINT: TOPICAL_OINTMENT | Freq: Every day | CUTANEOUS | Status: DC

## 2016-03-17 MED ORDER — AMOXICILLIN 500 MG PO CAPS: 500.0000 mg | ORAL_CAPSULE | Freq: Three times a day (TID) | ORAL | Status: DC

## 2016-03-17 MED ORDER — ACETAMINOPHEN-CODEINE #3 300-30 MG PO TABS: 1.0000 | ORAL_TABLET | Freq: Three times a day (TID) | ORAL | Status: DC | PRN

## 2016-03-17 MED ORDER — CLONIDINE HCL 0.1 MG PO TABS
0.1000 mg | ORAL_TABLET | Freq: Once | ORAL | Status: AC
  Administered 2016-03-17: 0.1 mg via ORAL

## 2016-03-17 MED FILL — AMOXICILLIN 500 MG CAPSULE: 500 | 10 days supply | Qty: 30 | Fill #0

## 2016-03-17 MED FILL — MOMETASONE FUROATE 0.1% CRM: 0.1 | 15 days supply | Qty: 45 | Fill #0

## 2016-03-17 MED FILL — ACETAMINOPHEN/COD #3 TABLET: 300-30 | 13 days supply | Qty: 40 | Fill #0
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
## 2016-03-17 NOTE — Progress Notes (Signed)
Patient's here for dental referral and tooth pain. Patient reports having pain in tooth x 2years Patient rates pain 10+  Patient concern about rash on hands and legs. Patient states it's been an ongoing issues for years.  Patient requesting refill of Kenalog.  Patient was unable to void for urine micro.  Patient reports taking insulin this morning.

## 2016-03-24 ENCOUNTER — Ambulatory Visit: Payer: Self-pay

## 2016-03-24 ENCOUNTER — Ambulatory Visit: Payer: Self-pay | Admitting: Internal Medicine

## 2016-03-24 MED FILL — ?CARVEDILOL 12.5 MG TABLET: 12.5 | 15 days supply | Qty: 30 | Fill #2

## 2016-03-25 ENCOUNTER — Ambulatory Visit: Payer: Self-pay | Admitting: Endocrinology

## 2016-03-25 DIAGNOSIS — Z0289 Encounter for other administrative examinations: Secondary | ICD-10-CM

## 2016-03-26 ENCOUNTER — Encounter: Payer: Self-pay | Admitting: Internal Medicine

## 2016-03-26 ENCOUNTER — Telehealth: Payer: Self-pay | Admitting: Endocrinology

## 2016-03-26 NOTE — Telephone Encounter (Signed)
Patient no showed today's appt. Please advise on how to follow up. °A. No follow up necessary. °B. Follow up urgent. Contact patient immediately. °C. Follow up necessary. Contact patient and schedule visit in ___ days. °D. Follow up advised. Contact patient and schedule visit in ____weeks. ° °

## 2016-03-26 NOTE — Telephone Encounter (Signed)
Please come back for a follow-up appointment in 6 weeks  

## 2016-03-27 NOTE — Telephone Encounter (Signed)
LVM for pt to return call

## 2016-03-27 NOTE — Telephone Encounter (Signed)
Brad [name],  Could you please contact the pt to reschedule. Thanks!  

## 2016-04-01 MED FILL — GABAPENTIN 300 MG CAPSULE: 300 | 30 days supply | Qty: 90 | Fill #2

## 2016-04-01 MED FILL — ?AMITRIPTYLINE HCL 25 MG TA: 25 | 30 days supply | Qty: 30 | Fill #1

## 2016-04-07 ENCOUNTER — Other Ambulatory Visit: Payer: Self-pay | Admitting: Physician Assistant

## 2016-04-16 ENCOUNTER — Other Ambulatory Visit: Payer: Self-pay | Admitting: Physician Assistant

## 2016-05-02 ENCOUNTER — Ambulatory Visit: Payer: Self-pay | Admitting: Internal Medicine

## 2016-05-08 ENCOUNTER — Other Ambulatory Visit: Payer: Self-pay | Admitting: Physician Assistant

## 2016-05-08 ENCOUNTER — Other Ambulatory Visit: Payer: Self-pay | Admitting: Internal Medicine

## 2016-05-08 MED FILL — GABAPENTIN 300 MG CAPSULE: 300 | 30 days supply | Qty: 90 | Fill #0

## 2016-05-08 MED FILL — AMITRIPTYLINE HCL 25 MG TAB: 25 | 30 days supply | Qty: 90 | Fill #0

## 2016-05-08 MED FILL — !NOVOLOG MIX 70/30 VIAL: 70-30/ML | 42 days supply | Qty: 50 | Fill #3

## 2016-05-08 MED FILL — CARVEDILOL 12.5 MG TABLET: 12.5 | 30 days supply | Qty: 60 | Fill #0

## 2016-05-13 ENCOUNTER — Encounter: Payer: Self-pay | Admitting: *Deleted

## 2016-05-26 ENCOUNTER — Ambulatory Visit: Payer: Self-pay

## 2016-05-27 ENCOUNTER — Observation Stay (HOSPITAL_COMMUNITY): Payer: Self-pay

## 2016-05-27 ENCOUNTER — Observation Stay (HOSPITAL_BASED_OUTPATIENT_CLINIC_OR_DEPARTMENT_OTHER): Payer: Self-pay

## 2016-05-27 ENCOUNTER — Encounter (HOSPITAL_COMMUNITY): Payer: Self-pay | Admitting: Neurology

## 2016-05-27 ENCOUNTER — Emergency Department (HOSPITAL_COMMUNITY): Payer: Self-pay

## 2016-05-27 ENCOUNTER — Observation Stay (HOSPITAL_COMMUNITY)
Admission: EM | Admit: 2016-05-27 | Discharge: 2016-05-28 | Disposition: A | Discharge status: Home / Self Care | Payer: Self-pay | Attending: Internal Medicine | Admitting: Internal Medicine

## 2016-05-27 DIAGNOSIS — I340 Nonrheumatic mitral (valve) insufficiency: Secondary | ICD-10-CM | POA: Insufficient documentation

## 2016-05-27 DIAGNOSIS — K219 Gastro-esophageal reflux disease without esophagitis: Secondary | ICD-10-CM | POA: Insufficient documentation

## 2016-05-27 DIAGNOSIS — R079 Chest pain, unspecified: Secondary | ICD-10-CM

## 2016-05-27 DIAGNOSIS — F1910 Other psychoactive substance abuse, uncomplicated: Secondary | ICD-10-CM

## 2016-05-27 DIAGNOSIS — Z79899 Other long term (current) drug therapy: Secondary | ICD-10-CM | POA: Insufficient documentation

## 2016-05-27 DIAGNOSIS — R938 Abnormal findings on diagnostic imaging of other specified body structures: Secondary | ICD-10-CM | POA: Insufficient documentation

## 2016-05-27 DIAGNOSIS — E1142 Type 2 diabetes mellitus with diabetic polyneuropathy: Secondary | ICD-10-CM

## 2016-05-27 DIAGNOSIS — I255 Ischemic cardiomyopathy: Secondary | ICD-10-CM | POA: Insufficient documentation

## 2016-05-27 DIAGNOSIS — Z7982 Long term (current) use of aspirin: Secondary | ICD-10-CM | POA: Insufficient documentation

## 2016-05-27 DIAGNOSIS — K7460 Unspecified cirrhosis of liver: Secondary | ICD-10-CM | POA: Insufficient documentation

## 2016-05-27 DIAGNOSIS — I5032 Chronic diastolic (congestive) heart failure: Secondary | ICD-10-CM | POA: Diagnosis present

## 2016-05-27 DIAGNOSIS — E119 Type 2 diabetes mellitus without complications: Secondary | ICD-10-CM | POA: Insufficient documentation

## 2016-05-27 DIAGNOSIS — B1920 Unspecified viral hepatitis C without hepatic coma: Secondary | ICD-10-CM | POA: Diagnosis present

## 2016-05-27 DIAGNOSIS — Z6828 Body mass index (BMI) 28.0-28.9, adult: Secondary | ICD-10-CM | POA: Insufficient documentation

## 2016-05-27 DIAGNOSIS — R55 Syncope and collapse: Secondary | ICD-10-CM

## 2016-05-27 DIAGNOSIS — E871 Hypo-osmolality and hyponatremia: Secondary | ICD-10-CM

## 2016-05-27 DIAGNOSIS — I951 Orthostatic hypotension: Principal | ICD-10-CM | POA: Diagnosis present

## 2016-05-27 DIAGNOSIS — H538 Other visual disturbances: Secondary | ICD-10-CM

## 2016-05-27 DIAGNOSIS — E1140 Type 2 diabetes mellitus with diabetic neuropathy, unspecified: Secondary | ICD-10-CM | POA: Insufficient documentation

## 2016-05-27 DIAGNOSIS — R634 Abnormal weight loss: Secondary | ICD-10-CM

## 2016-05-27 DIAGNOSIS — D696 Thrombocytopenia, unspecified: Secondary | ICD-10-CM | POA: Insufficient documentation

## 2016-05-27 DIAGNOSIS — F17210 Nicotine dependence, cigarettes, uncomplicated: Secondary | ICD-10-CM | POA: Insufficient documentation

## 2016-05-27 DIAGNOSIS — I110 Hypertensive heart disease with heart failure: Secondary | ICD-10-CM | POA: Insufficient documentation

## 2016-05-27 DIAGNOSIS — I5042 Chronic combined systolic (congestive) and diastolic (congestive) heart failure: Secondary | ICD-10-CM | POA: Insufficient documentation

## 2016-05-27 DIAGNOSIS — R9389 Abnormal findings on diagnostic imaging of other specified body structures: Secondary | ICD-10-CM

## 2016-05-27 DIAGNOSIS — B182 Chronic viral hepatitis C: Secondary | ICD-10-CM

## 2016-05-27 DIAGNOSIS — F329 Major depressive disorder, single episode, unspecified: Secondary | ICD-10-CM | POA: Insufficient documentation

## 2016-05-27 DIAGNOSIS — Z794 Long term (current) use of insulin: Secondary | ICD-10-CM

## 2016-05-27 LAB — CBC WITH DIFFERENTIAL/PLATELET
Basophils Absolute: 0.1 10*3/uL (ref 0.0–0.1)
Basophils Relative: 1 %
Eosinophils Absolute: 0.1 10*3/uL (ref 0.0–0.7)
Eosinophils Relative: 2 %
HCT: 48.6 % (ref 39.0–52.0)
Hemoglobin: 16.8 g/dL (ref 13.0–17.0)
Lymphocytes Relative: 35 %
Lymphs Abs: 1.9 10*3/uL (ref 0.7–4.0)
MCH: 32.5 pg (ref 26.0–34.0)
MCHC: 34.6 g/dL (ref 30.0–36.0)
MCV: 94 fL (ref 78.0–100.0)
Monocytes Absolute: 0.4 10*3/uL (ref 0.1–1.0)
Monocytes Relative: 8 %
Neutro Abs: 3 10*3/uL (ref 1.7–7.7)
Neutrophils Relative %: 54 %
Platelets: 115 10*3/uL — ABNORMAL LOW (ref 150–400)
RBC: 5.17 MIL/uL (ref 4.22–5.81)
RDW: 11.3 % — ABNORMAL LOW (ref 11.5–15.5)
WBC: 5.5 10*3/uL (ref 4.0–10.5)

## 2016-05-27 LAB — BASIC METABOLIC PANEL
Anion gap: 12 (ref 5–15)
BUN: 16 mg/dL (ref 6–20)
CO2: 25 mmol/L (ref 22–32)
Calcium: 8.6 mg/dL — ABNORMAL LOW (ref 8.9–10.3)
Chloride: 95 mmol/L — ABNORMAL LOW (ref 101–111)
Creatinine, Ser: 1.59 mg/dL — ABNORMAL HIGH (ref 0.61–1.24)
GFR calc Af Amer: 54 mL/min — ABNORMAL LOW (ref >60)
GFR calc non Af Amer: 47 mL/min — ABNORMAL LOW (ref >60)
Glucose, Bld: 230 mg/dL — ABNORMAL HIGH (ref 65–99)
Potassium: 4.2 mmol/L (ref 3.5–5.1)
Sodium: 132 mmol/L — ABNORMAL LOW (ref 135–145)

## 2016-05-27 LAB — RAPID URINE DRUG SCREEN, HOSP PERFORMED
Amphetamines: NOT DETECTED
Barbiturates: NOT DETECTED
Benzodiazepines: NOT DETECTED
Cocaine: POSITIVE — AB
Opiates: NOT DETECTED
Tetrahydrocannabinol: NOT DETECTED

## 2016-05-27 LAB — ECHOCARDIOGRAM COMPLETE
Height: 71 in
Weight: 3227.53 oz

## 2016-05-27 LAB — GLUCOSE, CAPILLARY
Glucose-Capillary: 292 mg/dL — ABNORMAL HIGH (ref 65–99)
Glucose-Capillary: 299 mg/dL — ABNORMAL HIGH (ref 65–99)

## 2016-05-27 LAB — I-STAT TROPONIN, ED: Troponin i, poc: 0.07 ng/mL (ref 0.00–0.08)

## 2016-05-27 LAB — PHOSPHORUS: Phosphorus: 2 mg/dL — ABNORMAL LOW (ref 2.5–4.6)

## 2016-05-27 LAB — ETHANOL: Alcohol, Ethyl (B): <5 mg/dL (ref <5)

## 2016-05-27 LAB — MRSA PCR SCREENING: MRSA by PCR: NEGATIVE

## 2016-05-27 LAB — MAGNESIUM: Magnesium: 1.5 mg/dL — ABNORMAL LOW (ref 1.7–2.4)

## 2016-05-27 LAB — CALCIUM: Calcium: 7.8 mg/dL — ABNORMAL LOW (ref 8.9–10.3)

## 2016-05-27 LAB — TSH: TSH: 3.545 u[IU]/mL (ref 0.350–4.500)

## 2016-05-27 MED ORDER — PANTOPRAZOLE SODIUM 40 MG PO TBEC
40.0000 mg | DELAYED_RELEASE_TABLET | Freq: Every day | ORAL | Status: DC
  Administered 2016-05-27 – 2016-05-28 (×2): 40 mg via ORAL
  Filled 2016-05-27 (×2): qty 1

## 2016-05-27 MED ORDER — AMITRIPTYLINE HCL 25 MG PO TABS
75.0000 mg | ORAL_TABLET | Freq: Every day | ORAL | Status: DC
  Administered 2016-05-27: 75 mg via ORAL
  Filled 2016-05-27: qty 3

## 2016-05-27 MED ORDER — SERTRALINE HCL 100 MG PO TABS
100.0000 mg | ORAL_TABLET | Freq: Every day | ORAL | Status: DC
  Administered 2016-05-27 – 2016-05-28 (×2): 100 mg via ORAL
  Filled 2016-05-27 (×2): qty 1

## 2016-05-27 MED ORDER — SODIUM CHLORIDE 0.9 % IV BOLUS (SEPSIS)
500.0000 mL | Freq: Once | INTRAVENOUS | Status: AC
  Administered 2016-05-27: 500 mL via INTRAVENOUS

## 2016-05-27 MED ORDER — SODIUM CHLORIDE 0.9 % IV SOLN
INTRAVENOUS | Status: DC
  Administered 2016-05-27 – 2016-05-28 (×3): via INTRAVENOUS

## 2016-05-27 MED ORDER — ASPIRIN 81 MG PO CHEW
81.0000 mg | CHEWABLE_TABLET | Freq: Every day | ORAL | Status: DC
  Administered 2016-05-27 – 2016-05-28 (×2): 81 mg via ORAL
  Filled 2016-05-27 (×3): qty 1

## 2016-05-27 MED ORDER — SODIUM CHLORIDE 0.9 % IV BOLUS (SEPSIS)
1000.0000 mL | Freq: Once | INTRAVENOUS | Status: AC
  Administered 2016-05-27: 1000 mL via INTRAVENOUS

## 2016-05-27 MED ORDER — SODIUM CHLORIDE 0.9% FLUSH
3.0000 mL | Freq: Two times a day (BID) | INTRAVENOUS | Status: DC
  Administered 2016-05-28: 3 mL via INTRAVENOUS

## 2016-05-27 MED ORDER — GABAPENTIN 300 MG PO CAPS
300.0000 mg | ORAL_CAPSULE | Freq: Three times a day (TID) | ORAL | Status: DC
  Administered 2016-05-27 – 2016-05-28 (×3): 300 mg via ORAL
  Filled 2016-05-27 (×3): qty 1

## 2016-05-27 MED ORDER — ATORVASTATIN CALCIUM 10 MG PO TABS
10.0000 mg | ORAL_TABLET | Freq: Every day | ORAL | Status: DC
  Administered 2016-05-27: 10 mg via ORAL
  Filled 2016-05-27: qty 1

## 2016-05-27 MED ORDER — HYDRALAZINE HCL 20 MG/ML IJ SOLN
10.0000 mg | Freq: Once | INTRAMUSCULAR | Status: AC
  Administered 2016-05-27: 10 mg via INTRAVENOUS
  Filled 2016-05-27: qty 1

## 2016-05-27 MED ORDER — CARVEDILOL 3.125 MG PO TABS
3.1250 mg | ORAL_TABLET | Freq: Two times a day (BID) | ORAL | Status: DC
  Administered 2016-05-28: 3.125 mg via ORAL
  Filled 2016-05-27: qty 1

## 2016-05-27 MED ORDER — ACETAMINOPHEN 650 MG RE SUPP: 650.0000 mg | Freq: Four times a day (QID) | RECTAL | Status: DC | PRN

## 2016-05-27 MED ORDER — HYDRALAZINE HCL 25 MG PO TABS
50.0000 mg | ORAL_TABLET | Freq: Once | ORAL | Status: DC
  Administered 2016-05-27: 50 mg via ORAL
  Filled 2016-05-27: qty 2

## 2016-05-27 MED ORDER — FUROSEMIDE 20 MG PO TABS
40.0000 mg | ORAL_TABLET | Freq: Once | ORAL | Status: DC
  Administered 2016-05-27: 40 mg via ORAL
  Filled 2016-05-27: qty 2

## 2016-05-27 MED ORDER — CARVEDILOL 3.125 MG PO TABS: 3.1250 mg | ORAL_TABLET | Freq: Once | ORAL | Status: DC

## 2016-05-27 MED ORDER — INSULIN ASPART 100 UNIT/ML ~~LOC~~ SOLN
0.0000 [IU] | Freq: Three times a day (TID) | SUBCUTANEOUS | Status: DC
  Administered 2016-05-27: 8 [IU] via SUBCUTANEOUS
  Administered 2016-05-28: 3 [IU] via SUBCUTANEOUS
  Administered 2016-05-28: 5 [IU] via SUBCUTANEOUS

## 2016-05-27 MED ORDER — ACETAMINOPHEN 325 MG PO TABS
650.0000 mg | ORAL_TABLET | Freq: Four times a day (QID) | ORAL | Status: DC | PRN
  Administered 2016-05-27: 650 mg via ORAL
  Filled 2016-05-27: qty 2

## 2016-05-27 MED ORDER — SERTRALINE HCL 50 MG PO TABS: 150.0000 mg | ORAL_TABLET | Freq: Every day | ORAL | Status: DC

## 2016-05-27 MED ORDER — ENOXAPARIN SODIUM 40 MG/0.4ML ~~LOC~~ SOLN
40.0000 mg | SUBCUTANEOUS | Status: DC
  Administered 2016-05-27: 40 mg via SUBCUTANEOUS
  Filled 2016-05-27: qty 0.4

## 2016-05-27 MED ORDER — INSULIN GLARGINE 100 UNIT/ML SOLOSTAR PEN: 110.0000 [IU] | PEN_INJECTOR | SUBCUTANEOUS | Status: DC

## 2016-05-27 MED ORDER — INSULIN GLARGINE 100 UNIT/ML ~~LOC~~ SOLN
50.0000 [IU] | Freq: Every day | SUBCUTANEOUS | Status: DC
  Administered 2016-05-28: 50 [IU] via SUBCUTANEOUS
  Filled 2016-05-27: qty 0.5

## 2016-05-27 MED ORDER — IOPAMIDOL (ISOVUE-300) INJECTION 61%
75.0000 mL | Freq: Once | INTRAVENOUS | Status: AC | PRN
  Administered 2016-05-27: 75 mL via INTRAVENOUS

## 2016-05-27 MED ORDER — CARVEDILOL 12.5 MG PO TABS
12.5000 mg | ORAL_TABLET | Freq: Once | ORAL | Status: DC
  Administered 2016-05-27: 12.5 mg via ORAL
  Filled 2016-05-27: qty 1

## 2016-05-27 MED ORDER — MAGNESIUM SULFATE 2 GM/50ML IV SOLN
2.0000 g | Freq: Once | INTRAVENOUS | Status: AC
  Administered 2016-05-28: 2 g via INTRAVENOUS
  Filled 2016-05-27: qty 50

## 2016-05-27 MED ORDER — CALCIUM GLUCONATE 10 % IV SOLN
1.0000 g | Freq: Once | INTRAVENOUS | Status: AC
  Administered 2016-05-28: 1 g via INTRAVENOUS
  Filled 2016-05-27: qty 10

## 2016-05-27 MED ORDER — IOPAMIDOL (ISOVUE-300) INJECTION 61%
INTRAVENOUS | Status: AC
  Filled 2016-05-27: qty 75

## 2016-05-27 MED ORDER — K PHOS MONO-SOD PHOS DI & MONO 155-852-130 MG PO TABS
500.0000 mg | ORAL_TABLET | Freq: Once | ORAL | Status: AC
  Administered 2016-05-28: 500 mg via ORAL
  Filled 2016-05-27: qty 2

## 2016-05-27 MED ORDER — INSULIN ASPART 100 UNIT/ML ~~LOC~~ SOLN
0.0000 [IU] | Freq: Every day | SUBCUTANEOUS | Status: DC
  Administered 2016-05-27: 3 [IU] via SUBCUTANEOUS

## 2016-05-27 MED ORDER — INSULIN GLARGINE 100 UNIT/ML ~~LOC~~ SOLN
75.0000 [IU] | Freq: Every day | SUBCUTANEOUS | Status: DC
  Administered 2016-05-27: 75 [IU] via SUBCUTANEOUS
  Filled 2016-05-27 (×2): qty 0.75
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
## 2016-05-27 NOTE — Progress Notes (Addendum)
Repeat OVS: SBP dropped to 74 so will give an additional 500 cc NS bolus-CT of chest in process  1429: Chest CT without evidence of nodules but does confirm diagnosis of cirrhosis  1739: UDS positive for cocaine, ECHO with improved systolic LV fnx with EF now up to 50-55% with severe LVH and grade 1 DD- given recurrent orthostasis will increase IVFs to 125/hr  Erin Hearing, ANP

## 2016-05-27 NOTE — ED Notes (Signed)
Per ems- pt reports left sided cp since yesterday, had 2 episodes of syncope yesterday that came on unexpectedly. Has abrasion to right outer eye from syncope. Woke up this morning feeling dizzy and still CP, 8/10. BP 178/100, CBG 252. Dizziness worsens when standing, improves with sitting. Has hx of CHF, DM, HTN. EKG ST 120. Given 324 aspirin, unable to get IV.

## 2016-05-27 NOTE — Progress Notes (Signed)
*  PRELIMINARY RESULTS* Echocardiogram Echo has been performed.  Brad [name] 05/27/2016, 3:35 PM
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
## 2016-05-28 ENCOUNTER — Observation Stay (HOSPITAL_COMMUNITY): Payer: MEDICAID

## 2016-05-28 ENCOUNTER — Telehealth: Payer: Self-pay | Admitting: Internal Medicine

## 2016-05-28 DIAGNOSIS — I10 Essential (primary) hypertension: Secondary | ICD-10-CM

## 2016-05-28 DIAGNOSIS — I951 Orthostatic hypotension: Principal | ICD-10-CM

## 2016-05-28 DIAGNOSIS — I5042 Chronic combined systolic (congestive) and diastolic (congestive) heart failure: Secondary | ICD-10-CM

## 2016-05-28 LAB — COMPREHENSIVE METABOLIC PANEL
ALT: 44 U/L (ref 17–63)
AST: 44 U/L — ABNORMAL HIGH (ref 15–41)
Albumin: 3.5 g/dL (ref 3.5–5.0)
Alkaline Phosphatase: 63 U/L (ref 38–126)
Anion gap: 10 (ref 5–15)
BUN: 19 mg/dL (ref 6–20)
CO2: 26 mmol/L (ref 22–32)
Calcium: 8.3 mg/dL — ABNORMAL LOW (ref 8.9–10.3)
Chloride: 100 mmol/L — ABNORMAL LOW (ref 101–111)
Creatinine, Ser: 1.5 mg/dL — ABNORMAL HIGH (ref 0.61–1.24)
GFR calc Af Amer: 58 mL/min — ABNORMAL LOW (ref >60)
GFR calc non Af Amer: 50 mL/min — ABNORMAL LOW (ref >60)
Glucose, Bld: 184 mg/dL — ABNORMAL HIGH (ref 65–99)
Potassium: 3 mmol/L — ABNORMAL LOW (ref 3.5–5.1)
Sodium: 136 mmol/L (ref 135–145)
Total Bilirubin: 0.8 mg/dL (ref 0.3–1.2)
Total Protein: 8.1 g/dL (ref 6.5–8.1)

## 2016-05-28 LAB — BASIC METABOLIC PANEL
Anion gap: 7 (ref 5–15)
BUN: 16 mg/dL (ref 6–20)
CO2: 27 mmol/L (ref 22–32)
Calcium: 8.2 mg/dL — ABNORMAL LOW (ref 8.9–10.3)
Chloride: 102 mmol/L (ref 101–111)
Creatinine, Ser: 1.54 mg/dL — ABNORMAL HIGH (ref 0.61–1.24)
GFR calc Af Amer: 57 mL/min — ABNORMAL LOW (ref >60)
GFR calc non Af Amer: 49 mL/min — ABNORMAL LOW (ref >60)
Glucose, Bld: 193 mg/dL — ABNORMAL HIGH (ref 65–99)
Potassium: 3.5 mmol/L (ref 3.5–5.1)
Sodium: 136 mmol/L (ref 135–145)

## 2016-05-28 LAB — CBC
HCT: 43.7 % (ref 39.0–52.0)
Hemoglobin: 15.3 g/dL (ref 13.0–17.0)
MCH: 32.8 pg (ref 26.0–34.0)
MCHC: 35 g/dL (ref 30.0–36.0)
MCV: 93.6 fL (ref 78.0–100.0)
Platelets: 168 10*3/uL (ref 150–400)
RBC: 4.67 MIL/uL (ref 4.22–5.81)
RDW: 11.4 % — ABNORMAL LOW (ref 11.5–15.5)
WBC: 5.1 10*3/uL (ref 4.0–10.5)

## 2016-05-28 LAB — GLUCOSE, CAPILLARY
Glucose-Capillary: 173 mg/dL — ABNORMAL HIGH (ref 65–99)
Glucose-Capillary: 174 mg/dL — ABNORMAL HIGH (ref 65–99)
Glucose-Capillary: 235 mg/dL — ABNORMAL HIGH (ref 65–99)

## 2016-05-28 LAB — MAGNESIUM: Magnesium: 2.3 mg/dL (ref 1.7–2.4)

## 2016-05-28 MED ORDER — HYDRALAZINE HCL 20 MG/ML IJ SOLN
10.0000 mg | Freq: Once | INTRAMUSCULAR | Status: AC
  Administered 2016-05-28: 10 mg via INTRAVENOUS
  Filled 2016-05-28: qty 1

## 2016-05-28 MED ORDER — POTASSIUM CHLORIDE CRYS ER 20 MEQ PO TBCR: 40.0000 meq | EXTENDED_RELEASE_TABLET | Freq: Once | ORAL | Status: DC

## 2016-05-28 MED ORDER — HYDRALAZINE HCL 25 MG PO TABS: 25.0000 mg | ORAL_TABLET | Freq: Three times a day (TID) | ORAL | Status: DC

## 2016-05-28 MED ORDER — CARVEDILOL 6.25 MG PO TABS: 6.2500 mg | ORAL_TABLET | Freq: Two times a day (BID) | ORAL | Status: DC

## 2016-05-28 MED ORDER — POTASSIUM CHLORIDE CRYS ER 20 MEQ PO TBCR
40.0000 meq | EXTENDED_RELEASE_TABLET | Freq: Once | ORAL | Status: AC
  Administered 2016-05-28: 40 meq via ORAL
  Filled 2016-05-28: qty 2

## 2016-05-28 MED ORDER — POTASSIUM CHLORIDE CRYS ER 20 MEQ PO TBCR
40.0000 meq | EXTENDED_RELEASE_TABLET | Freq: Two times a day (BID) | ORAL | Status: DC
  Administered 2016-05-28: 40 meq via ORAL
  Filled 2016-05-28: qty 2

## 2016-05-28 MED ORDER — HYDRALAZINE HCL 25 MG PO TABS: 50.0000 mg | ORAL_TABLET | Freq: Three times a day (TID) | ORAL | Status: DC

## 2016-05-28 MED ORDER — CARVEDILOL 3.125 MG PO TABS
3.1250 mg | ORAL_TABLET | Freq: Once | ORAL | Status: AC
  Administered 2016-05-28: 3.125 mg via ORAL
  Filled 2016-05-28: qty 1

## 2016-05-28 MED ORDER — FUROSEMIDE 40 MG PO TABS: 20.0000 mg | ORAL_TABLET | Freq: Every day | ORAL | Status: DC | PRN

## 2016-05-28 MED ORDER — OMEPRAZOLE 20 MG PO CPDR: 20.0000 mg | DELAYED_RELEASE_CAPSULE | Freq: Every day | ORAL | Status: DC

## 2016-05-28 MED FILL — ?FUROSEMIDE 40 MG TABLET: 40 | 30 days supply | Qty: 15 | Fill #0

## 2016-05-28 MED FILL — CARVEDILOL 6.25 MG TABLET: 6.25 | 30 days supply | Qty: 60 | Fill #0

## 2016-05-28 MED FILL — hydrALAZINE HCL 25 MG TABS: 25 | 10 days supply | Qty: 60 | Fill #0

## 2016-05-28 MED FILL — ?OMEPRAZOLE DR 20 MG CAPSUL: 20 | 30 days supply | Qty: 30 | Fill #0

## 2016-05-28 NOTE — Progress Notes (Addendum)
Initial Nutrition Assessment  DOCUMENTATION CODES:   Not applicable  INTERVENTION:  -RD to continue to monitor  NUTRITION DIAGNOSIS:   Inadequate oral intake related to social / environmental circumstances as evidenced by per patient/family report.  GOAL:   Patient will meet greater than or equal to 90% of their needs  MONITOR:   PO intake, Supplement acceptance, Labs, Skin, I & O's  REASON FOR ASSESSMENT:   Malnutrition Screening Tool    ASSESSMENT:   Brad [name] is a 55 y.o. male with medical history significant for known nonischemic cardiomyopathy (normal coronaries on cardiac catheterization in 2016) with combined systolic heart failure with an EF of 35-40% with moderate concentric hypertrophy and associated grade 1 diastolic dysfunction as well as moderate to severe mitral regurgitation, diabetes on insulin followed by Dr. Loanne [name], hypertension, diabetic peripheral neuropathy, HCV infection, chronic, cytopenia and history of polysubstance abuse including prior heroin use  Brad [name] is a pleasant 55 yo man who presents with unintentional weight loss of  26#/11% of total bodyweight in the past 5 months.   Pt states that he was recently released from prison in November, during which he gained a significant amount of weight.   He now works as a Biomedical scientist, but must walk 5 blocks to the bus stop and back home, in addition to only eating "One and a half times a day."  He seems to have trouble adjusting his eating schedule in addition to the increased amount of exercise he is doing. He also does not drink much fluid, and is likely experiencing some dehydration.  PO intake during stay has been good, he had an egg white omelet and homefries this morning that he claims he ate most of. Documented PO intake 80% No issues chewing or swallowing He was experiencing some nausea, but seems to have passed.  Nutrition-Focused physical exam completed. Findings are no fat depletion, mild  muscle depletion, and no edema.   Labs and Medications reviewed: CBGs 173-299; K 3.0; EGFR 58, Cr 1.50; Phos 2.0  Diet Order:  Diet Carb Modified Fluid consistency:: Thin; Room service appropriate?: Yes Diet - low sodium heart healthy Diet Carb Modified  Skin:  Wound (see comment) (Abrasion to R and L Leg)  Last BM:  6/6  Height:   Ht Readings from Last 1 Encounters:  05/27/16 '5\' 11"'  (1.803 m)    Weight:   Wt Readings from Last 1 Encounters:  05/28/16 207 lb 14.3 oz (94.3 kg)    Ideal Body Weight:  78.18 kg  BMI:  Body mass index is 29.01 kg/(m^2).  Estimated Nutritional Needs:   Kcal:  1900-2400 calories  Protein:  95-110 grams  Fluid:  >/= 1.9L  EDUCATION NEEDS:   No education needs identified at this time  Brad [name]. Brad Gary, MS, RD LDN Inpatient Clinical Dietitian Pager [phone]

## 2016-05-28 NOTE — Discharge Summary (Signed)
[facility] Discharge Summary   Patient: Brad [name] [id number]   PCP: Lance Bosch, NP DOB: 1961-12-20   Date of admission: 05/27/2016   Date of discharge: 05/28/2016     Discharge Diagnoses:  Principal Problem:   Orthostatic syncope Active Problems:   HTN (hypertension)   Diabetic peripheral neuropathy associated with type 2 diabetes mellitus ([place])   Diabetes mellitus type 2, controlled ([place])   Chronic combined systolic and diastolic heart failure, NYHA class 1 ([place])   Non-ischemic cardiomyopathy ([place])   Unintentional weight loss   Thrombocytopenia (HCC)   Acute hyponatremia   Abnormal chest x-ray   Polysubstance abuse   Chronic hepatitis C with cirrhosis ([place])  Recommendations for Outpatient Follow-up:  1. Please follow-up with PCP in one week with BMP   Follow-up Information    Follow up with Lance Bosch, NP. Schedule an appointment as soon as possible for a visit in 1 week.   Specialty:  Internal Medicine   Why:  with BMP   Contact information:   [place] [address] [phone]      Diet recommendation: Cardiac diet carb modified  Activity: The patient is advised to gradually reintroduce usual activities.  Discharge Condition: good  History of present illness: As per the H and P dictated on admission, "Brad [name] is a 55 y.o. male with medical history significant for known nonischemic cardiomyopathy (normal coronaries on cardiac catheterization in 2016) with combined systolic heart failure with an EF of 35-40% with moderate concentric hypertrophy and associated grade 1 diastolic dysfunction as well as moderate to severe mitral regurgitation, diabetes on insulin followed by Dr. Loanne Drilling, hypertension, diabetic peripheral neuropathy, HCV infection, chronic, cytopenia and history of polysubstance abuse including prior heroin use. Patient reports that for the past several days he has felt weak with standing and he had 3 episodes of  syncope yesterday and one episode this morning prompting him to come to the ER for evaluation. He denied any chest pain although he reports that if he walks long distances he does get somewhat short of breath. He's had no palpitations. In reviewing the medical record in February he weighed 232 pounds, at his endocrinology visit in March he weighed 218 pounds and today he weighed 206 pounds. Back in February he was eating at least 3 large meals per day but since that time he has not been as hungry and he states that he is down to 2 small meals per day. He also is working a job in [employer] and he reports that the environment is very hot and he "sweats a whole lot" but does not typically increase his fluid intake."  Hospital Course:  Summary of his active problems in the hospital is as following.  Principal Problem:   Orthostatic syncope Blood pressure was initially dropping significantly from 010 systolic to 70 systolic. At present the blood pressure remains stable in 3 different position. Next and patient symptomatically also feeling better. We will exerting change in patient's blood pressure medication. Recommend follow-up outpatient with PCP. Echogram shows normal ejection fraction with grade 1 diastolic dysfunction. Telemetry also does not show any acute abnormality. Next line  Active Problems:   HTN (hypertension) patient has isolated supine hypertension. With this I would reduce his Coreg from 25 mg twice a day 226.25 milligrams twice a day. Next and also reduce his Lasix from 40 mg every other day to 20 mg as needed. Patient's hydralazine from 50 mg twice a day to  25 mg 3 times a day. Recommend the patient to follow-up with PCP in one week.    Diabetic peripheral neuropathy associated with type 2 diabetes mellitus ([place])   Diabetes mellitus type 2, controlled ([place]) Continuing home Lantus regimen. Patient will follow-up with PCP in one week.    Chronic combined systolic and diastolic  heart failure, NYHA class 1 (HCC) Does not appear to be volume overloaded. Recommended to use Lasix only as needed.    Unintentional weight loss   Thrombocytopenia (HCC)   Abnormal chest x-ray Patient underwent a CT chest which was not showing any acute abnormality related to pulmonary nodule. Recommend to continue monitoring.  All other chronic medical condition were stable during the hospitalization.  Patient was ambulatory without any assistance. On the day of the discharge the patient's vitals are stable, and no other acute medical condition were reported by patient. the patient was felt safe to be discharge at home with family.  Procedures and Results:  None   Consultations:  None  DISCHARGE MEDICATION: Discharge Medication List as of 05/28/2016  2:01 PM    CONTINUE these medications which have CHANGED   Details  carvedilol (COREG) 6.25 MG tablet Take 1 tablet (6.25 mg total) by mouth 2 (two) times daily with a meal., Starting 05/28/2016, Until Discontinued, Normal    furosemide (LASIX) 40 MG tablet Take 0.5 tablets (20 mg total) by mouth daily as needed for fluid or edema., Starting 05/28/2016, Until Discontinued, Normal    hydrALAZINE (APRESOLINE) 25 MG tablet Take 1 tablet (25 mg total) by mouth 3 (three) times daily., Starting 05/28/2016, Until Discontinued, Normal    omeprazole (PRILOSEC) 20 MG capsule Take 1 capsule (20 mg total) by mouth daily., Starting 05/28/2016, Until Discontinued, Normal      CONTINUE these medications which have NOT CHANGED   Details  amitriptyline (ELAVIL) 25 MG tablet TAKE 3 TABLETS BY MOUTH AT BEDTIME, Normal    aspirin 81 MG tablet Take 81 mg by mouth daily., Until Discontinued, Historical Med    atorvastatin (LIPITOR) 10 MG tablet Take 1 tablet (10 mg total) by mouth daily., Starting 11/21/2015, Until Discontinued, Normal    gabapentin (NEURONTIN) 300 MG capsule TAKE 1 CAPSULE BY MOUTH 3 TIMES DAILY, Normal    glucose 4 GM chewable tablet  Chew 1 tablet by mouth daily as needed for low blood sugar., Until Discontinued, Historical Med    glucose monitoring kit (FREESTYLE) monitoring kit 1 each by Does not apply route as needed for other., Starting 11/16/2015, Until Discontinued, Print    Insulin Glargine (LANTUS SOLOSTAR) 100 UNIT/ML Solostar Pen Inject 110 Units into the skin every morning. And pen needles 2/day, Starting 03/11/2016, Until Discontinued, Normal    mometasone (ELOCON) 0.1 % ointment Apply topically daily., Starting 03/17/2016, Until Discontinued, Normal    potassium chloride SA (K-DUR,KLOR-CON) 20 MEQ tablet Take 1 tablet (20 mEq total) by mouth daily., Starting 01/03/2016, Until Discontinued, Normal    sertraline (ZOLOFT) 100 MG tablet Take 1.5 tablets (150 mg total) by mouth daily., Starting 11/21/2015, Until Discontinued, Normal    sildenafil (REVATIO) 20 MG tablet Take 2-5 tablets by mouth as needed for sexual activity., Normal       Allergies  Allergen Reactions  . Angiotensin Receptor Blockers Other (See Comments)    (Angioedema with lisinopril, therefore ARB's are contraindicated)  . Lisinopril Other (See Comments)    Angioedema  . Pamelor [Nortriptyline Hcl]     Swelling    Discharge Instructions  Diet - low sodium heart healthy    Complete by:  As directed      Diet Carb Modified    Complete by:  As directed      Discharge instructions    Complete by:  As directed   It is important that you read following instructions as well as go over your medication list with RN to help you understand your care after this hospitalization.  Discharge Instructions: Please follow-up with PCP in one week with BMP  Please request your primary care physician to go over all Hospital Tests and Procedure/Radiological results at the follow up,  Please get all Hospital records sent to your PCP by signing hospital release before you go home.   Do not drive, operating heavy machinery, perform activities at heights,  swimming or participation in water activities or provide baby sitting services as your were admitted for syncope; until you have been seen by Primary Care Physician or a Neurologist and advised to do so again. Do not take more than prescribed Pain, Sleep and Anxiety Medications. You were cared for by a hospitalist during your hospital stay. If you have any questions about your discharge medications or the care you received while you were in the hospital after you are discharged, you can call the unit and ask to speak with the hospitalist on call if the hospitalist that took care of you is not available.  Once you are discharged, your primary care physician will handle any further medical issues. Please note that NO REFILLS for any discharge medications will be authorized once you are discharged, as it is imperative that you return to your primary care physician (or establish a relationship with a primary care physician if you do not have one) for your aftercare needs so that they can reassess your need for medications and monitor your lab values. You Must read complete instructions/literature along with all the possible adverse reactions/side effects for all the Medicines you take and that have been prescribed to you. Take any new Medicines after you have completely understood and accept all the possible adverse reactions/side effects. Wear Seat belts while driving. If you have smoked or chewed Tobacco in the last 2 yrs please stop smoking and/or stop any Recreational drug use.     Increase activity slowly    Complete by:  As directed           Discharge Exam: Filed Weights   05/27/16 0844 05/27/16 1222 05/28/16 0500  Weight: 93.441 kg (206 lb) 91.5 kg (201 lb 11.5 oz) 94.3 kg (207 lb 14.3 oz)   Filed Vitals:   05/28/16 1400 05/28/16 1418  BP: 160/85 160/85  Pulse:    Temp:  98.4 F (36.9 C)  Resp: 18 20   General: Appear in no distress, no Rash; Oral Mucosa moist. Cardiovascular: S1 and  S2 Present, no Murmur, no JVD Respiratory: Bilateral Air entry present and Clear to Auscultation, no Crackles, no wheezes Abdomen: Bowel Sound present, Soft and no tenderness Extremities: trace Pedal edema, no calf tenderness Neurology: Grossly no focal neuro deficit.  The results of significant diagnostics from this hospitalization (including imaging, microbiology, ancillary and laboratory) are listed below for reference.    Significant Diagnostic Studies: Dg Chest 2 View  05/27/2016  CLINICAL DATA:  Chest pain EXAM: CHEST  2 VIEW COMPARISON:  November 14, 2015 FINDINGS: There is patchy infiltrate in the left base. The lungs elsewhere are clear. There is a presumed nipple shadow on the right. Heart  size and pulmonary vascularity are normal. No adenopathy. There is mild degenerative change in the thoracic spine. IMPRESSION: Patchy infiltrate left lower lobe. Probable nipple shadow on the right; repeat study with nipple markers could confirm. Stable cardiac silhouette. Followup PA and lateral chest radiographs recommended in 3-4 weeks following trial of antibiotic therapy to ensure resolution and exclude underlying malignancy. Electronically Signed   By: Lowella Grip III M.D.   On: 05/27/2016 09:27   Ct Chest W Contrast  05/27/2016  CLINICAL DATA:  55 year old male with acute shortness of breath for 1 day. Weight loss. Abnormal chest x-ray with possible right lung nodule and left lung airspace disease. EXAM: CT CHEST WITH CONTRAST TECHNIQUE: Multidetector CT imaging of the chest was performed during intravenous contrast administration. CONTRAST:  45m ISOVUE-300 IOPAMIDOL (ISOVUE-300) INJECTION 61% COMPARISON:  05/27/2016 and prior chest radiographs. FINDINGS: Mediastinum/Nodes: Cardiomegaly and moderate coronary artery calcifications noted. There is no evidence of thoracic aortic aneurysm, mediastinal fullness, pericardial effusion or enlarged lymph nodes. Lungs/Pleura: There is no evidence of  nodule, mass, airspace disease or consolidation. Subsegmental atelectasis/ scarring in the right middle lobe and lingular identified. No pleural effusion or pneumothorax identified. No endobronchial or endotracheal lesions are identified. Upper abdomen: Cirrhosis identified. Musculoskeletal: No acute or suspicious abnormality. IMPRESSION: No evidence of pulmonary nodule, mass or airspace disease. Subsegmental atelectasis/scarring in the right middle lobe and lingula. Cirrhosis. Cardiomegaly and coronary artery disease. Electronically Signed   By: JMargarette CanadaM.D.   On: 05/27/2016 14:26   Mr Brain Wo Contrast  05/28/2016  CLINICAL DATA:  55year old male with blurred vision, shortness of breath. Initial encounter. EXAM: MRI HEAD WITHOUT CONTRAST TECHNIQUE: Multiplanar, multiecho pulse sequences of the brain and surrounding structures were obtained without intravenous contrast. COMPARISON:  Head CT without contrast 11/15/2011. FINDINGS: No restricted diffusion to suggest acute infarction. No midline shift, mass effect, evidence of mass lesion, ventriculomegaly, extra-axial collection or acute intracranial hemorrhage. Cervicomedullary junction and pituitary are within normal limits. Major intracranial vascular flow voids are preserved with mild intracranial artery tortuosity. Mild for age nonspecific periventricular white matter T2 and FLAIR hyperintensity. No cortical encephalomalacia or chronic cerebral blood products. Mild T2 heterogeneity in the thalami, more so the right, which may reflect chronic small vessel disease. Negative brainstem and cerebellum. Visible internal auditory structures appear normal. Trace mastoid fluid. Orbits soft tissues appear normal. Optic chiasm appears within normal limits, no suprasellar region mass effect aside from some distal right ICA dolichoectasia. Negative scalp soft tissues. Negative visualized cervical spine. Normal bone marrow signal. IMPRESSION: 1. No acute intracranial  abnormality. No explanation for acute visual changes. 2. Mild for age nonspecific signal changes most commonly due to chronic small vessel disease. 3. Intracranial artery tortuosity.  Query chronic hypertension. Electronically Signed   By: HGenevie AnnM.D.   On: 05/28/2016 12:37    Microbiology: Recent Results (from the past 240 hour(s))  MRSA PCR Screening     Status: None   Collection Time: 05/27/16 12:30 PM  Result Value Ref Range Status   MRSA by PCR NEGATIVE NEGATIVE Final    Comment:        The GeneXpert MRSA Assay (FDA approved for NASAL specimens only), is one component of a comprehensive MRSA colonization surveillance program. It is not intended to diagnose MRSA infection nor to guide or monitor treatment for MRSA infections.      Labs: CBC:  Recent Labs Lab 05/27/16 0902 05/28/16 0344  WBC 5.5 5.1  NEUTROABS 3.0  --  HGB 16.8 15.3  HCT 48.6 43.7  MCV 94.0 93.6  PLT 115* 850   Basic Metabolic Panel:  Recent Labs Lab 05/27/16 0902 05/27/16 2143 05/28/16 0344 05/28/16 1245  NA 132*  --  136 136  K 4.2  --  3.0* 3.5  CL 95*  --  100* 102  CO2 25  --  26 27  GLUCOSE 230*  --  184* 193*  BUN 16  --  19 16  CREATININE 1.59*  --  1.50* 1.54*  CALCIUM 8.6* 7.8* 8.3* 8.2*  MG  --  1.5* 2.3  --   PHOS  --  2.0*  --   --    Liver Function Tests:  Recent Labs Lab 05/28/16 0344  AST 44*  ALT 44  ALKPHOS 63  BILITOT 0.8  PROT 8.1  ALBUMIN 3.5   CBG:  Recent Labs Lab 05/27/16 1547 05/27/16 2145 05/28/16 0551 05/28/16 0759 05/28/16 1317  GLUCAP 292* 299* 173* 174* 235*   Time spent: 30 minutes  Signed:  Maddeline Roorda  [facility] 05/28/2016 , 6:11 PM

## 2016-05-28 NOTE — Telephone Encounter (Signed)
[facility]  Patient: Brad [name]   PCP: Lance Bosch, NP DOB: 1961/01/18     DOS: 05/28/2016   Assessment and plan Patient's UDS was positive for cocaine. Patient was recommended to stop taking Coreg. Clonidine 1 mg daily prescription will be called into the pharmacy.  Patient verbalized understanding.  Author: Berle Mull, MD [facility] Pager: [phone] 05/28/2016 6:22 PM   If 7PM-7AM, please contact night-coverage at [email or web address], password [facility]

## 2016-05-28 NOTE — Progress Notes (Signed)
PT Cancellation Note  Patient Details Name: Brad [name] MRN: [id number] DOB: [birth year]   Cancelled Treatment:    Reason Eval/Treat Not Completed: Medical issues which prohibited therapy (Patient's BP is elevated. Will check back later.)   Claretha Cooper 05/28/2016, 9:12 AM Tresa Endo PT [phone]

## 2016-05-29 MED FILL — ?CLONIDINE HCL 0.1 MG TABL: 0.1 | 30 days supply | Qty: 30 | Fill #0

## 2016-06-02 ENCOUNTER — Other Ambulatory Visit: Payer: Self-pay | Admitting: Internal Medicine

## 2016-06-02 MED FILL — AMITRIPTYLINE HCL 25 MG TAB: 25 | 30 days supply | Qty: 90 | Fill #0

## 2016-06-04 MED FILL — GABAPENTIN 300 MG CAPSULE: 300 | 30 days supply | Qty: 90 | Fill #1

## 2016-06-04 MED FILL — TRUE METRIX TEST STRIP: 25 days supply | Qty: 100 | Fill #1

## 2016-06-05 ENCOUNTER — Ambulatory Visit: Payer: Self-pay

## 2016-06-11 ENCOUNTER — Ambulatory Visit: Payer: Self-pay

## 2016-06-26 ENCOUNTER — Encounter (HOSPITAL_COMMUNITY): Payer: Self-pay | Admitting: Emergency Medicine

## 2016-06-26 ENCOUNTER — Emergency Department (HOSPITAL_COMMUNITY): Payer: MEDICAID

## 2016-06-26 ENCOUNTER — Observation Stay (HOSPITAL_COMMUNITY)
Admission: EM | Admit: 2016-06-26 | Discharge: 2016-06-28 | Disposition: A | Discharge status: Home / Self Care | Payer: Self-pay | Attending: Internal Medicine | Admitting: Internal Medicine

## 2016-06-26 ENCOUNTER — Emergency Department (HOSPITAL_COMMUNITY): Payer: Self-pay

## 2016-06-26 DIAGNOSIS — K219 Gastro-esophageal reflux disease without esophagitis: Secondary | ICD-10-CM | POA: Diagnosis present

## 2016-06-26 DIAGNOSIS — E119 Type 2 diabetes mellitus without complications: Secondary | ICD-10-CM | POA: Insufficient documentation

## 2016-06-26 DIAGNOSIS — I5032 Chronic diastolic (congestive) heart failure: Secondary | ICD-10-CM | POA: Diagnosis present

## 2016-06-26 DIAGNOSIS — I10 Essential (primary) hypertension: Secondary | ICD-10-CM

## 2016-06-26 DIAGNOSIS — F1910 Other psychoactive substance abuse, uncomplicated: Secondary | ICD-10-CM | POA: Diagnosis present

## 2016-06-26 DIAGNOSIS — K7460 Unspecified cirrhosis of liver: Secondary | ICD-10-CM

## 2016-06-26 DIAGNOSIS — M25511 Pain in right shoulder: Secondary | ICD-10-CM | POA: Insufficient documentation

## 2016-06-26 DIAGNOSIS — B182 Chronic viral hepatitis C: Secondary | ICD-10-CM | POA: Diagnosis present

## 2016-06-26 DIAGNOSIS — R1013 Epigastric pain: Principal | ICD-10-CM | POA: Insufficient documentation

## 2016-06-26 DIAGNOSIS — Z794 Long term (current) use of insulin: Secondary | ICD-10-CM | POA: Insufficient documentation

## 2016-06-26 DIAGNOSIS — R109 Unspecified abdominal pain: Secondary | ICD-10-CM

## 2016-06-26 DIAGNOSIS — M25519 Pain in unspecified shoulder: Secondary | ICD-10-CM

## 2016-06-26 DIAGNOSIS — I110 Hypertensive heart disease with heart failure: Secondary | ICD-10-CM | POA: Insufficient documentation

## 2016-06-26 DIAGNOSIS — R1011 Right upper quadrant pain: Secondary | ICD-10-CM | POA: Insufficient documentation

## 2016-06-26 DIAGNOSIS — R748 Abnormal levels of other serum enzymes: Secondary | ICD-10-CM | POA: Insufficient documentation

## 2016-06-26 DIAGNOSIS — R778 Other specified abnormalities of plasma proteins: Secondary | ICD-10-CM

## 2016-06-26 DIAGNOSIS — Z7984 Long term (current) use of oral hypoglycemic drugs: Secondary | ICD-10-CM | POA: Insufficient documentation

## 2016-06-26 DIAGNOSIS — Z79899 Other long term (current) drug therapy: Secondary | ICD-10-CM | POA: Insufficient documentation

## 2016-06-26 DIAGNOSIS — I5042 Chronic combined systolic (congestive) and diastolic (congestive) heart failure: Secondary | ICD-10-CM

## 2016-06-26 DIAGNOSIS — I509 Heart failure, unspecified: Secondary | ICD-10-CM | POA: Insufficient documentation

## 2016-06-26 DIAGNOSIS — I161 Hypertensive emergency: Secondary | ICD-10-CM | POA: Insufficient documentation

## 2016-06-26 DIAGNOSIS — R7989 Other specified abnormal findings of blood chemistry: Secondary | ICD-10-CM | POA: Diagnosis present

## 2016-06-26 DIAGNOSIS — F17210 Nicotine dependence, cigarettes, uncomplicated: Secondary | ICD-10-CM | POA: Insufficient documentation

## 2016-06-26 DIAGNOSIS — I429 Cardiomyopathy, unspecified: Secondary | ICD-10-CM

## 2016-06-26 DIAGNOSIS — Z7982 Long term (current) use of aspirin: Secondary | ICD-10-CM | POA: Insufficient documentation

## 2016-06-26 LAB — GLUCOSE, CAPILLARY
Glucose-Capillary: 217 mg/dL — ABNORMAL HIGH (ref 65–99)
Glucose-Capillary: 236 mg/dL — ABNORMAL HIGH (ref 65–99)
Glucose-Capillary: 325 mg/dL — ABNORMAL HIGH (ref 65–99)
Glucose-Capillary: 275 mg/dL — ABNORMAL HIGH (ref 65–99)

## 2016-06-26 LAB — CBC WITH DIFFERENTIAL/PLATELET
Basophils Absolute: 0 10*3/uL (ref 0.0–0.1)
Basophils Relative: 1 %
Eosinophils Absolute: 0.1 10*3/uL (ref 0.0–0.7)
Eosinophils Relative: 1 %
HCT: 45.5 % (ref 39.0–52.0)
Hemoglobin: 16.3 g/dL (ref 13.0–17.0)
Lymphocytes Relative: 35 %
Lymphs Abs: 2.1 10*3/uL (ref 0.7–4.0)
MCH: 33.5 pg (ref 26.0–34.0)
MCHC: 35.8 g/dL (ref 30.0–36.0)
MCV: 93.6 fL (ref 78.0–100.0)
Monocytes Absolute: 0.7 10*3/uL (ref 0.1–1.0)
Monocytes Relative: 12 %
Neutro Abs: 3.1 10*3/uL (ref 1.7–7.7)
Neutrophils Relative %: 51 %
Platelets: 159 10*3/uL (ref 150–400)
RBC: 4.86 MIL/uL (ref 4.22–5.81)
RDW: 11.5 % (ref 11.5–15.5)
WBC: 6 10*3/uL (ref 4.0–10.5)

## 2016-06-26 LAB — LIPASE, BLOOD: Lipase: 33 U/L (ref 11–51)

## 2016-06-26 LAB — ETHANOL: Alcohol, Ethyl (B): <5 mg/dL (ref <5)

## 2016-06-26 LAB — RAPID URINE DRUG SCREEN, HOSP PERFORMED
Amphetamines: NOT DETECTED
Barbiturates: NOT DETECTED
Benzodiazepines: NOT DETECTED
Cocaine: NOT DETECTED
Opiates: NOT DETECTED
Tetrahydrocannabinol: NOT DETECTED

## 2016-06-26 LAB — TROPONIN I
Troponin I: 0.16 ng/mL (ref <0.03)
Troponin I: 0.13 ng/mL (ref <0.03)
Troponin I: 0.12 ng/mL (ref <0.03)

## 2016-06-26 LAB — COMPREHENSIVE METABOLIC PANEL
ALT: 61 U/L (ref 17–63)
AST: 76 U/L — ABNORMAL HIGH (ref 15–41)
Albumin: 3.4 g/dL — ABNORMAL LOW (ref 3.5–5.0)
Alkaline Phosphatase: 84 U/L (ref 38–126)
Anion gap: 10 (ref 5–15)
BUN: 13 mg/dL (ref 6–20)
CO2: 26 mmol/L (ref 22–32)
Calcium: 9.6 mg/dL (ref 8.9–10.3)
Chloride: 99 mmol/L — ABNORMAL LOW (ref 101–111)
Creatinine, Ser: 1.58 mg/dL — ABNORMAL HIGH (ref 0.61–1.24)
GFR calc Af Amer: 55 mL/min — ABNORMAL LOW (ref >60)
GFR calc non Af Amer: 47 mL/min — ABNORMAL LOW (ref >60)
Glucose, Bld: 173 mg/dL — ABNORMAL HIGH (ref 65–99)
Potassium: 4 mmol/L (ref 3.5–5.1)
Sodium: 135 mmol/L (ref 135–145)
Total Bilirubin: 0.8 mg/dL (ref 0.3–1.2)
Total Protein: 9.1 g/dL — ABNORMAL HIGH (ref 6.5–8.1)

## 2016-06-26 LAB — I-STAT TROPONIN, ED
Troponin i, poc: 0.07 ng/mL (ref 0.00–0.08)
Troponin i, poc: 0.11 ng/mL (ref 0.00–0.08)

## 2016-06-26 LAB — CBG MONITORING, ED: Glucose-Capillary: 166 mg/dL — ABNORMAL HIGH (ref 65–99)

## 2016-06-26 MED ORDER — ASPIRIN 81 MG PO TABS: 81.0000 mg | ORAL_TABLET | Freq: Every day | ORAL | Status: DC

## 2016-06-26 MED ORDER — ACETAMINOPHEN 325 MG PO TABS: 650.0000 mg | ORAL_TABLET | ORAL | Status: DC | PRN

## 2016-06-26 MED ORDER — MORPHINE SULFATE (PF) 2 MG/ML IV SOLN
2.0000 mg | INTRAVENOUS | Status: DC | PRN
  Administered 2016-06-26 (×2): 2 mg via INTRAVENOUS
  Administered 2016-06-27 (×2): 4 mg via INTRAVENOUS
  Administered 2016-06-27: 2 mg via INTRAVENOUS
  Administered 2016-06-27 – 2016-06-28 (×3): 4 mg via INTRAVENOUS
  Filled 2016-06-26 (×4): qty 2
  Filled 2016-06-26 (×2): qty 1
  Filled 2016-06-26: qty 2
  Filled 2016-06-26: qty 1

## 2016-06-26 MED ORDER — NITROGLYCERIN 2 % TD OINT
1.0000 [in_us] | TOPICAL_OINTMENT | Freq: Once | TRANSDERMAL | Status: AC
  Administered 2016-06-26: 1 [in_us] via TOPICAL
  Filled 2016-06-26: qty 1

## 2016-06-26 MED ORDER — ATORVASTATIN CALCIUM 10 MG PO TABS
10.0000 mg | ORAL_TABLET | Freq: Every day | ORAL | Status: DC
  Administered 2016-06-26 – 2016-06-27 (×2): 10 mg via ORAL
  Filled 2016-06-26 (×2): qty 1

## 2016-06-26 MED ORDER — CLONIDINE HCL 0.1 MG PO TABS
0.1000 mg | ORAL_TABLET | Freq: Two times a day (BID) | ORAL | Status: DC
  Administered 2016-06-26: 0.1 mg via ORAL
  Filled 2016-06-26: qty 1

## 2016-06-26 MED ORDER — HYDRALAZINE HCL 25 MG PO TABS
25.0000 mg | ORAL_TABLET | Freq: Three times a day (TID) | ORAL | Status: DC
  Administered 2016-06-26: 25 mg via ORAL
  Filled 2016-06-26: qty 1

## 2016-06-26 MED ORDER — TRAMADOL HCL 50 MG PO TABS
50.0000 mg | ORAL_TABLET | Freq: Four times a day (QID) | ORAL | Status: DC | PRN
  Administered 2016-06-26 – 2016-06-28 (×5): 50 mg via ORAL
  Filled 2016-06-26 (×5): qty 1

## 2016-06-26 MED ORDER — INSULIN ASPART 100 UNIT/ML ~~LOC~~ SOLN
0.0000 [IU] | Freq: Three times a day (TID) | SUBCUTANEOUS | Status: DC
  Administered 2016-06-26: 11 [IU] via SUBCUTANEOUS
  Administered 2016-06-27: 3 [IU] via SUBCUTANEOUS
  Administered 2016-06-27: 15 [IU] via SUBCUTANEOUS
  Administered 2016-06-27: 8 [IU] via SUBCUTANEOUS
  Administered 2016-06-28: 3 [IU] via SUBCUTANEOUS
  Administered 2016-06-28: 8 [IU] via SUBCUTANEOUS

## 2016-06-26 MED ORDER — SERTRALINE HCL 100 MG PO TABS
100.0000 mg | ORAL_TABLET | Freq: Every day | ORAL | Status: DC
  Administered 2016-06-26 – 2016-06-28 (×3): 100 mg via ORAL
  Filled 2016-06-26 (×3): qty 1

## 2016-06-26 MED ORDER — CARVEDILOL 12.5 MG PO TABS
12.5000 mg | ORAL_TABLET | Freq: Two times a day (BID) | ORAL | Status: DC
  Administered 2016-06-26 – 2016-06-28 (×5): 12.5 mg via ORAL
  Filled 2016-06-26 (×5): qty 1

## 2016-06-26 MED ORDER — ENOXAPARIN SODIUM 40 MG/0.4ML ~~LOC~~ SOLN
40.0000 mg | SUBCUTANEOUS | Status: DC
  Administered 2016-06-26 – 2016-06-28 (×3): 40 mg via SUBCUTANEOUS
  Filled 2016-06-26 (×3): qty 0.4

## 2016-06-26 MED ORDER — GI COCKTAIL ~~LOC~~: 30.0000 mL | Freq: Four times a day (QID) | ORAL | Status: DC | PRN

## 2016-06-26 MED ORDER — ASPIRIN EC 81 MG PO TBEC
81.0000 mg | DELAYED_RELEASE_TABLET | Freq: Every day | ORAL | Status: DC
  Administered 2016-06-26 – 2016-06-28 (×3): 81 mg via ORAL
  Filled 2016-06-26 (×3): qty 1

## 2016-06-26 MED ORDER — HYDRALAZINE HCL 20 MG/ML IJ SOLN
5.0000 mg | Freq: Once | INTRAMUSCULAR | Status: AC
  Administered 2016-06-26: 5 mg via INTRAVENOUS
  Filled 2016-06-26: qty 1

## 2016-06-26 MED ORDER — LORAZEPAM 1 MG PO TABS: 1.0000 mg | ORAL_TABLET | Freq: Four times a day (QID) | ORAL | Status: DC | PRN

## 2016-06-26 MED ORDER — VITAMIN B-1 100 MG PO TABS
100.0000 mg | ORAL_TABLET | Freq: Every day | ORAL | Status: DC
  Administered 2016-06-26 – 2016-06-28 (×3): 100 mg via ORAL
  Filled 2016-06-26 (×3): qty 1

## 2016-06-26 MED ORDER — CYCLOBENZAPRINE HCL 10 MG PO TABS
5.0000 mg | ORAL_TABLET | Freq: Three times a day (TID) | ORAL | Status: DC | PRN
  Administered 2016-06-26 – 2016-06-28 (×3): 5 mg via ORAL
  Filled 2016-06-26 (×3): qty 1

## 2016-06-26 MED ORDER — INSULIN GLARGINE 100 UNIT/ML ~~LOC~~ SOLN
30.0000 [IU] | Freq: Two times a day (BID) | SUBCUTANEOUS | Status: DC
  Administered 2016-06-26 – 2016-06-27 (×3): 30 [IU] via SUBCUTANEOUS
  Filled 2016-06-26 (×4): qty 0.3

## 2016-06-26 MED ORDER — LORAZEPAM 2 MG/ML IJ SOLN: 1.0000 mg | Freq: Four times a day (QID) | INTRAMUSCULAR | Status: DC | PRN

## 2016-06-26 MED ORDER — THIAMINE HCL 100 MG/ML IJ SOLN
100.0000 mg | Freq: Every day | INTRAMUSCULAR | Status: DC
  Filled 2016-06-26: qty 2

## 2016-06-26 MED ORDER — ADULT MULTIVITAMIN W/MINERALS CH
1.0000 | ORAL_TABLET | Freq: Every day | ORAL | Status: DC
  Administered 2016-06-26 – 2016-06-28 (×3): 1 via ORAL
  Filled 2016-06-26 (×3): qty 1

## 2016-06-26 MED ORDER — CLONIDINE HCL 0.2 MG PO TABS
0.2000 mg | ORAL_TABLET | Freq: Two times a day (BID) | ORAL | Status: DC
  Administered 2016-06-26 – 2016-06-28 (×4): 0.2 mg via ORAL
  Filled 2016-06-26 (×4): qty 1

## 2016-06-26 MED ORDER — GABAPENTIN 300 MG PO CAPS
300.0000 mg | ORAL_CAPSULE | Freq: Three times a day (TID) | ORAL | Status: DC
  Administered 2016-06-26 – 2016-06-28 (×7): 300 mg via ORAL
  Filled 2016-06-26 (×7): qty 1

## 2016-06-26 MED ORDER — SODIUM CHLORIDE 0.9 % IV SOLN
INTRAVENOUS | Status: DC
  Administered 2016-06-26: 10 mL/h via INTRAVENOUS

## 2016-06-26 MED ORDER — HYDROMORPHONE HCL 1 MG/ML IJ SOLN
1.0000 mg | Freq: Once | INTRAMUSCULAR | Status: AC
  Administered 2016-06-26: 1 mg via INTRAVENOUS
  Filled 2016-06-26: qty 1

## 2016-06-26 MED ORDER — ONDANSETRON HCL 4 MG/2ML IJ SOLN: 4.0000 mg | Freq: Four times a day (QID) | INTRAMUSCULAR | Status: DC | PRN

## 2016-06-26 MED ORDER — CLONIDINE HCL 0.1 MG PO TABS
0.1000 mg | ORAL_TABLET | Freq: Once | ORAL | Status: AC
  Administered 2016-06-26: 0.1 mg via ORAL
  Filled 2016-06-26: qty 1

## 2016-06-26 MED ORDER — PANTOPRAZOLE SODIUM 40 MG PO TBEC
40.0000 mg | DELAYED_RELEASE_TABLET | Freq: Two times a day (BID) | ORAL | Status: DC
  Administered 2016-06-26 – 2016-06-28 (×5): 40 mg via ORAL
  Filled 2016-06-26 (×5): qty 1

## 2016-06-26 MED ORDER — ASPIRIN 81 MG PO CHEW
324.0000 mg | CHEWABLE_TABLET | Freq: Once | ORAL | Status: AC
  Administered 2016-06-26: 324 mg via ORAL
  Filled 2016-06-26: qty 4

## 2016-06-26 MED ORDER — AMITRIPTYLINE HCL 25 MG PO TABS
75.0000 mg | ORAL_TABLET | Freq: Every day | ORAL | Status: DC
  Administered 2016-06-26 – 2016-06-27 (×2): 75 mg via ORAL
  Filled 2016-06-26 (×2): qty 3

## 2016-06-26 MED ORDER — MORPHINE SULFATE (PF) 4 MG/ML IV SOLN
4.0000 mg | Freq: Once | INTRAVENOUS | Status: AC
  Administered 2016-06-26: 4 mg via INTRAVENOUS
  Filled 2016-06-26: qty 1

## 2016-06-26 MED ORDER — ONDANSETRON HCL 4 MG/2ML IJ SOLN
4.0000 mg | Freq: Once | INTRAMUSCULAR | Status: AC
  Administered 2016-06-26: 4 mg via INTRAVENOUS
  Filled 2016-06-26: qty 2

## 2016-06-26 MED ORDER — INSULIN ASPART 100 UNIT/ML ~~LOC~~ SOLN
0.0000 [IU] | Freq: Every day | SUBCUTANEOUS | Status: DC
  Administered 2016-06-26: 3 [IU] via SUBCUTANEOUS
  Administered 2016-06-27: 2 [IU] via SUBCUTANEOUS

## 2016-06-26 MED ORDER — FOLIC ACID 1 MG PO TABS
1.0000 mg | ORAL_TABLET | Freq: Every day | ORAL | Status: DC
  Administered 2016-06-26 – 2016-06-28 (×3): 1 mg via ORAL
  Filled 2016-06-26 (×3): qty 1

## 2016-06-26 MED ORDER — HYDRALAZINE HCL 50 MG PO TABS
50.0000 mg | ORAL_TABLET | Freq: Three times a day (TID) | ORAL | Status: DC
  Administered 2016-06-26 – 2016-06-28 (×6): 50 mg via ORAL
  Filled 2016-06-26 (×6): qty 1

## 2016-06-26 NOTE — ED Notes (Signed)
Attempted to call report

## 2016-06-26 NOTE — Progress Notes (Signed)
The pressure still not well-controlled with most recent reading 166/115 so that increase clonidine to 0.2 mg twice a day first higher dose at 10 PM tonight and have increased hydralazine from 25 mg to 50 mg and continued every 8 hours.  Erin Hearing, ANP

## 2016-06-26 NOTE — ED Notes (Signed)
After administering 1 mg of dilaudid pt became diaphoretic. Pt concerned that it could be a medication reaction or his CBG dropping.  Per pt request pt asking to check CBG. MD notified. MD okayed CBG check.

## 2016-06-26 NOTE — ED Notes (Signed)
Pt c/o epigastric pain and R shoulder pain. Pt denies radiation to the neck, back, or jaw. Pain worse with palpations. Pain 9/10. Pain started approx 1 week ago. Pt has not been eating due to lack of appetite.

## 2016-06-26 NOTE — ED Notes (Signed)
MD at bedside. 

## 2016-06-26 NOTE — Care Management Note (Signed)
Case Management Note  Patient Details  Name: Brad [name] MRN: [id number] Date of Birth: 04/24/1961  Subjective/Objective:                  55 y.o. male with h/o HTN, DM, CHF, GERD who presents to the Emergency Department complaining of moderate, 9-10/10 epigastric abdominal pain onset 2 days ago and worsened last night with associated nausea, diaphoresis. / From home alone. PCP is Chari Manning at [facility].  Action/Plan: Follow for disposition needs.   Expected Discharge Date:  06/27/16               Expected Discharge Plan:  Home/Self Care  In-House Referral:     Discharge planning Services  CM Consult, [facility] / P4[facility] (established/new)  Post Acute Care Choice:    Choice offered to:     DME Arranged:    DME Agency:     HH Arranged:    HH Agency:     Status of Service:  In process, will continue to follow  If discussed at Long Length of Stay Meetings, dates discussed:    Additional Comments:  Fuller Mandril, RN 06/26/2016, 8:23 AM
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
## 2016-06-26 NOTE — Progress Notes (Signed)
Patient states he takes elavil 75mg  at bedtime to help him sleep. Dr. Rachelle Hora paged to ask for order. Patient has on his home profile. [facility] [employer]

## 2016-06-27 DIAGNOSIS — M5010 Cervical disc disorder with radiculopathy, unspecified cervical region: Secondary | ICD-10-CM

## 2016-06-27 DIAGNOSIS — E1142 Type 2 diabetes mellitus with diabetic polyneuropathy: Secondary | ICD-10-CM

## 2016-06-27 DIAGNOSIS — I248 Other forms of acute ischemic heart disease: Secondary | ICD-10-CM

## 2016-06-27 LAB — H PYLORI, IGM, IGG, IGA AB
H Pylori IgG: <0.9 U/mL (ref 0.0–0.8)
H. Pylogi, Iga Abs: 11.8 units — ABNORMAL HIGH (ref 0.0–8.9)
H. Pylogi, Igm Abs: <9 units (ref 0.0–8.9)

## 2016-06-27 LAB — CBC
HCT: 40.8 % (ref 39.0–52.0)
Hemoglobin: 14 g/dL (ref 13.0–17.0)
MCH: 32.6 pg (ref 26.0–34.0)
MCHC: 34.3 g/dL (ref 30.0–36.0)
MCV: 95.1 fL (ref 78.0–100.0)
Platelets: 132 10*3/uL — ABNORMAL LOW (ref 150–400)
RBC: 4.29 MIL/uL (ref 4.22–5.81)
RDW: 11.6 % (ref 11.5–15.5)
WBC: 4.2 10*3/uL (ref 4.0–10.5)

## 2016-06-27 LAB — BASIC METABOLIC PANEL
Anion gap: 8 (ref 5–15)
BUN: 20 mg/dL (ref 6–20)
CO2: 29 mmol/L (ref 22–32)
Calcium: 8.7 mg/dL — ABNORMAL LOW (ref 8.9–10.3)
Chloride: 93 mmol/L — ABNORMAL LOW (ref 101–111)
Creatinine, Ser: 2.23 mg/dL — ABNORMAL HIGH (ref 0.61–1.24)
GFR calc Af Amer: 36 mL/min — ABNORMAL LOW (ref >60)
GFR calc non Af Amer: 31 mL/min — ABNORMAL LOW (ref >60)
Glucose, Bld: 305 mg/dL — ABNORMAL HIGH (ref 65–99)
Potassium: 3.7 mmol/L (ref 3.5–5.1)
Sodium: 130 mmol/L — ABNORMAL LOW (ref 135–145)

## 2016-06-27 LAB — GLUCOSE, CAPILLARY
Glucose-Capillary: 256 mg/dL — ABNORMAL HIGH (ref 65–99)
Glucose-Capillary: 194 mg/dL — ABNORMAL HIGH (ref 65–99)
Glucose-Capillary: 354 mg/dL — ABNORMAL HIGH (ref 65–99)
Glucose-Capillary: 231 mg/dL — ABNORMAL HIGH (ref 65–99)

## 2016-06-27 MED ORDER — SODIUM CHLORIDE 0.9 % IV BOLUS (SEPSIS)
1000.0000 mL | Freq: Once | INTRAVENOUS | Status: AC
Start: 2016-06-27 — End: 2016-06-27
  Administered 2016-06-27: 1000 mL via INTRAVENOUS

## 2016-06-27 MED ORDER — INSULIN GLARGINE 100 UNIT/ML ~~LOC~~ SOLN
40.0000 [IU] | Freq: Two times a day (BID) | SUBCUTANEOUS | Status: DC
  Administered 2016-06-27 – 2016-06-28 (×2): 40 [IU] via SUBCUTANEOUS
  Filled 2016-06-27 (×3): qty 0.4

## 2016-06-27 NOTE — Progress Notes (Addendum)
Cardiologist: Dr. Debara Pickett Subjective:   No new complaints, no chest pain, no significant shortness of breath. "My shoulder hurts, need to do something about it".  Objective:  Vital Signs in the last 24 hours: Temp:  [97.6 F (36.4 C)-97.9 F (36.6 C)] 97.6 F (36.4 C) (07/07 0452) Pulse Rate:  [64-85] 64 (07/07 0452) Resp:  [14-16] 14 (07/07 0452) BP: (98-166)/(67-115) 140/87 mmHg (07/07 0452) SpO2:  [96 %-100 %] 100 % (07/07 0452) Weight:  [202 lb 11.2 oz (91.944 kg)-206 lb 11.2 oz (93.759 kg)] 206 lb 11.2 oz (93.759 kg) (07/07 0452)  Intake/Output from previous day: 07/06 0701 - 07/07 0700 In: 120 [P.O.:120] Out: 400 [Urine:400]   Physical Exam: General: Well developed, well nourished, in no acute distress. Head:  Normocephalic and atraumatic. Lungs: Clear to auscultation and percussion. Heart: Normal S1 and S2.  No murmur, rubs or gallops.  Abdomen: soft, non-tender, positive bowel sounds. Extremities: No clubbing or cyanosis. No edema. Neurologic: Alert and oriented x 3.    Lab Results:  Recent Labs  06/26/16 0336 06/27/16 0356  WBC 6.0 4.2  HGB 16.3 14.0  PLT 159 132*    Recent Labs  06/26/16 0336 06/27/16 0356  NA 135 130*  K 4.0 3.7  CL 99* 93*  CO2 26 29  GLUCOSE 173* 305*  BUN 13 20  CREATININE 1.58* 2.23*    Recent Labs  06/26/16 1257 06/26/16 2001  TROPONINI 0.13* 0.12*   Hepatic Function Panel  Recent Labs  06/26/16 0336  PROT 9.1*  ALBUMIN 3.4*  AST 76*  ALT 61  ALKPHOS 84  BILITOT 0.8   No results for input(s): CHOL in the last 72 hours. No results for input(s): PROTIME in the last 72 hours.  Imaging: Ct Abdomen Pelvis Wo Contrast  06/26/2016  CLINICAL DATA:  Initial evaluation for acute nausea a, right upper quadrant pain. EXAM: CT ABDOMEN AND PELVIS WITHOUT CONTRAST TECHNIQUE: Multidetector CT imaging of the abdomen and pelvis was performed following the standard protocol without IV contrast. COMPARISON:  Prior  ultrasound from earlier the same day. FINDINGS: Minimal subsegmental atelectasis seen dependently within the visualized lung bases. Visualized lungs are otherwise clear. Coronary artery calcifications are partially visualized. Trace pericardial fluid noted. No pleural effusion. Liver demonstrates a nodular contour, compatible with cirrhosis. Evaluation for focal intrahepatic mass limited on this noncontrast examination. Possible gastrohepatic varices noted. Gallbladder within normal limits. No biliary dilatation. Spleen within normal limits for size and appearance. Adrenal glands and pancreas demonstrate a normal unenhanced appearance. Kidneys are equal in size without evidence of nephrolithiasis or hydronephrosis. Subcentimeter hypodense lesion within the left kidney too small the characterize on this exam, but may reflect a small cyst. No radiopaque calculi seen along the course of either renal collecting system. There is no hydroureter. Stomach within normal limits. No evidence for bowel obstruction. No abnormal wall thickening or inflammatory fat stranding seen about the bowels. Appendix well visualized in the right lower quadrant and is of normal caliber and appearance associated inflammatory changes to suggest acute appendicitis. Mild circumferential bladder wall thickening, suspected to be related incomplete distension and/or chronic outlet obstruction. Prostate is enlarged with median lobe hypertrophy. Prostate measures 5.7 cm in transverse diameter. No free air or fluid. No ascites. Few mildly prominent porta hepatis nodes measure up to 9 mm, likely related underlying intrinsic liver disease. No pathologically enlarged intra-abdominal or pelvic lymph nodes identified. Scattered aorto bi-iliac atherosclerotic calcifications, moderate in nature. Aorta measures up to 3.1 cm in  greatest diameter just above the takeoff of the celiac axis. Scattered soft tissue density within the subcutaneous fat of the anterior  abdomen, likely related to injection sites. No acute osseous abnormality. No worrisome lytic or blastic osseous lesions. IMPRESSION: 1. No acute intra-abdominal or pelvic process identified. 2. Nodular contour of the liver, consistent with cirrhosis. 3. Enlarged prostate. Mild circumferential bladder wall thickening suspected to be related to chronic outlet obstruction. 4. Coronary artery calcifications with moderate aorto bi-iliac atherosclerotic disease. Intra-abdominal aorta measures up to 3.1 cm in diameter. Recommend followup by ultrasound in 3 years. This recommendation follows ACR consensus guidelines: White Paper of the ACR Incidental Findings Committee II on Vascular Findings. [facility]iol 2013; 10:789-794 Electronically Signed   By: Jeannine Boga M.D.   On: 06/26/2016 06:25   Dg Abd Acute W/chest  06/26/2016  CLINICAL DATA:  Acute onset of generalized abdominal pain and nausea. Initial encounter. EXAM: DG ABDOMEN ACUTE W/ 1V CHEST COMPARISON:  Chest radiograph and CTA of the chest performed 05/27/2016, and abdominal ultrasound performed 04/26/2012 FINDINGS: The lungs are well-aerated. Pulmonary vascularity is at the upper limits of normal. There is no evidence of focal opacification, pleural effusion or pneumothorax. The cardiomediastinal silhouette is within normal limits. The visualized bowel gas pattern is unremarkable. Scattered stool and air are seen within the colon; there is no evidence of small bowel dilatation to suggest obstruction. No free intra-abdominal air is identified on the provided upright view. No acute osseous abnormalities are seen; the sacroiliac joints are unremarkable in appearance. IMPRESSION: 1. Unremarkable bowel gas pattern; no free intra-abdominal air seen. Small amount of stool noted in the colon. 2. No acute cardiopulmonary process seen. Electronically Signed   By: Garald Balding M.D.   On: 06/26/2016 04:04   US Abdomen Limited Ruq  06/26/2016  CLINICAL DATA:   Acute onset of right upper quadrant abdominal pain. Initial encounter. EXAM: US ABDOMEN LIMITED - RIGHT UPPER QUADRANT COMPARISON:  Abdominal ultrasound performed 04/26/2012 FINDINGS: Gallbladder: No gallstones or wall thickening visualized. No sonographic Murphy sign noted by sonographer. Common bile duct: Diameter: 0.3 cm, within normal limits in caliber. Liver: No focal lesion identified. The mildly nodular contour of the liver raises question for mild hepatic cirrhosis. Parenchymal echogenicity is mildly heterogeneous. IMPRESSION: 1. No acute abnormality seen at the right upper quadrant. 2. Mildly nodular contour of the liver raises question for mild hepatic cirrhosis. Would correlate with LFTs. Electronically Signed   By: Garald Balding M.D.   On: 06/26/2016 05:01   Personally viewed.   Telemetry: No adverse arrhythmias Personally viewed.   EKG:  LVH with repolarization abnormality Personally viewed.  Cardiac Studies:  Echo severe LVH EF 50-55  Meds: Scheduled Meds: . amitriptyline  75 mg Oral QHS  . aspirin EC  81 mg Oral Daily  . atorvastatin  10 mg Oral q1800  . carvedilol  12.5 mg Oral BID WC  . cloNIDine  0.2 mg Oral BID  . enoxaparin (LOVENOX) injection  40 mg Subcutaneous Q24H  . folic acid  1 mg Oral Daily  . gabapentin  300 mg Oral TID  . hydrALAZINE  50 mg Oral TID  . insulin aspart  0-15 Units Subcutaneous TID WC  . insulin aspart  0-5 Units Subcutaneous QHS  . insulin glargine  30 Units Subcutaneous BID  . multivitamin with minerals  1 tablet Oral Daily  . pantoprazole  40 mg Oral BID  . sertraline  100 mg Oral Daily  . thiamine  100 mg Oral Daily   Continuous Infusions: . sodium chloride 10 mL/hr (06/26/16 0751)   PRN Meds:.acetaminophen, cyclobenzaprine, gi cocktail, LORazepam **OR** LORazepam, morphine injection, ondansetron (ZOFRAN) IV, traMADol  Assessment/Plan:  Principal Problem:   Elevated troponin Active Problems:   Accelerated hypertension   GERD  (gastroesophageal reflux disease)   Diabetic peripheral neuropathy associated with type 2 diabetes mellitus (HCC)   Diabetes mellitus type 2, controlled (HCC)   Chronic combined systolic and diastolic heart failure, NYHA class 1 (HCC)   Non-ischemic cardiomyopathy (HCC)   Polysubstance abuse   Chronic hepatitis C with cirrhosis (HCC)   Acute epigastric pain  Difficult to control hypertension  - Better this morning.  - Medications reviewed as above.  - Expressed the importance of medications. When he is on his home medications, his blood pressure seems under very reasonable control.  - EF 55% with severe LVH  - Remember, he did have a previous episode in June of orthostatic hypotension leading to syncope. Be careful with any further increase in antihypertensives.  Elevated troponin  - Flat at 0.12  - Demand ischemia in the setting of severely elevated blood pressure.  CKD 3  - Avoiding ACE inhibitor at this point.  No new cardiology recommendations. We will sign off. Please call with any questions.  Candee Furbish 06/27/2016, 8:38 AM

## 2016-06-27 NOTE — Progress Notes (Signed)
Inpatient Diabetes Program Recommendations  AACE/ADA: New Consensus Statement on Inpatient Glycemic Control (2015)  Target Ranges:  Prepandial:   less than 140 mg/dL      Peak postprandial:   less than 180 mg/dL (1-2 hours)      Critically ill patients:  140 - 180 mg/dL   Lab Results  Component Value Date   GLUCAP 256* 06/27/2016   HGBA1C 9.1 03/11/2016    Review of Glycemic Control:  Results for Brad [name], Brad [name] (MRN [id number]) as of 06/27/2016 10:19  Ref. Range 06/26/2016 09:22 06/26/2016 11:17 06/26/2016 16:01 06/26/2016 21:22 06/27/2016 07:21  Glucose-Capillary Latest Ref Range: 65-99 mg/dL 217 (H) 236 (H) 325 (H) 275 (H) 256 (H)   Diabetes history: Type 2 diabetes Outpatient Diabetes medications: Lantus 50 units q AM and 75 units q PM Current orders for Inpatient glycemic control:  Lantus 30 units bid, Novolog moderate tid with meals and HS  Inpatient Diabetes Program Recommendations:   Consider increasing Lantus to 40 units bid.  Also consider adding Novolog meal coverage 6 units tid with meals.  Thanks, Adah Perl, RN, BC-ADM Inpatient Diabetes Coordinator Pager [phone] (8a-5p)

## 2016-06-27 NOTE — Progress Notes (Addendum)
PROGRESS NOTE    Brad [name]  [id number] DOB: 1961/08/03 DOA: 06/26/2016 PCP: Lance Bosch, NP   Brief Narrative:  Brad [name] is a 55 year old gentleman with a past medical history of hypertension, diabetes mellitus, history of ischemic cardiomyopathy, presented to the emergency room on 06/26/2016 with complaints of right shoulder pain. On presentation he was found to be hypertensive with blood pressure of 204/124. Having multiple risk factors was further worked up .have elevated troponin of 0.1. He was placed in overnight observation with serial troponins remaining stable at 0.1. He was evaluated by cardiology who felt elevated troponins could be related to demand ischemia in setting of elevated blood pressures. Blood pressures improving after restarting Coreg 12.5 mg by mouth twice a day, hydralazine 50 mg by mouth 3 times a day and clonidine 0.2 mg twice a day. He stated being off of his blood pressure medications.    Assessment & Plan:   Principal Problem:   Elevated troponin Active Problems:   Accelerated hypertension   GERD (gastroesophageal reflux disease)   Diabetic peripheral neuropathy associated with type 2 diabetes mellitus (HCC)   Diabetes mellitus type 2, controlled (HCC)   Chronic combined systolic and diastolic heart failure, NYHA class 1 (HCC)   Non-ischemic cardiomyopathy (HCC)   Polysubstance abuse   Chronic hepatitis C with cirrhosis (HCC)   Acute epigastric pain   1.  Probable demand ischemia -Brad [name] is a 55 year old gentleman who presented with right shoulder pain having initial lab work that showed a troponin of 0.13. -There were no changes on telemetry, EKG did not reveal acute ischemic changes -He did present hypertensive with labs showing elevated creatinine. -He was seen and evaluated by cardiology who did not recommend further cardiac workup  2.  Right shoulder pain -I suspect secondary to cervical radiculopathy, as he describes his pain as  sharp, stabbing, radiating from his neck down to his shoulder. On exam symptoms worsened with rotation of head -Did not have neurologic deficits -He was patient given information on cervical radiculopathy  3.  Acute kidney injury. -Lab work showing elevated creatinine of 2.23 -Will provide IV fluids -Repeat lab work in a.m.  4.  History of chronic diastolic congestive heart failure -Last transthoracic echocardiogram performed on 05/27/2016 that revealed a preserved EF of 50-55% with grade 1 diastolic dysfunction -Providing IV fluids, will monitor while status closely  5.  Type 2 diabetes mellitus -Poorly controlled, presented with elevated blood sugars, likely secondary to medication nonadherence -Diabetic coordinator recommending increasing his Lantus to 40 units subcutaneous twice a day  DVT prophylaxis: Lovenox Code Status: Full code Family Communication:  Disposition Plan: Anticipate discharge in the next 24 hours  Consultants:   Cardiology  Procedures:     Antimicrobials:      Subjective: He reports having ongoing severe shoulder pain  Objective: Filed Vitals:   06/26/16 1435 06/27/16 0002 06/27/16 0452 06/27/16 1437  BP: 122/67 119/72 140/87 117/74  Pulse: 64 65 64 59  Temp: 97.8 F (36.6 C) 97.6 F (36.4 C) 97.6 F (36.4 C) 97.7 F (36.5 C)  TempSrc: Oral Oral Oral Oral  Resp:  16 14   Height:      Weight:   93.759 kg (206 lb 11.2 oz)   SpO2: 96% 99% 100% 98%    Intake/Output Summary (Last 24 hours) at 06/27/16 1456 Last data filed at 06/27/16 1300  Gross per 24 hour  Intake    360 ml  Output      0  ml  Net    360 ml   Filed Weights   06/26/16 0322 06/26/16 0916 06/27/16 0452  Weight: 94.348 kg (208 lb) 91.944 kg (202 lb 11.2 oz) 93.759 kg (206 lb 11.2 oz)    Examination:  General exam: Mild distress Respiratory system: Clear to auscultation. Respiratory effort normal. Cardiovascular system: S1 & S2 heard, RRR. No JVD, murmurs, rubs,  gallops or clicks. No pedal edema. Gastrointestinal system: Abdomen is nondistended, soft and nontender. No organomegaly or masses felt. Normal bowel sounds heard. Central nervous system: Alert and oriented. No focal neurological deficits. Extremities: Symmetric 5 x 5 power. Skin: No rashes, lesions or ulcers Psychiatry: Judgement and insight appear normal. Mood & affect appropriate.     Data Reviewed: I have personally reviewed following labs and imaging studies  CBC:  Recent Labs Lab 06/26/16 0336 06/27/16 0356  WBC 6.0 4.2  NEUTROABS 3.1  --   HGB 16.3 14.0  HCT 45.5 40.8  MCV 93.6 95.1  PLT 159 [id number]*   Basic Metabolic Panel:  Recent Labs Lab 06/26/16 0336 06/27/16 0356  NA 135 130*  K 4.0 3.7  CL 99* 93*  CO2 26 29  GLUCOSE 173* 305*  BUN 13 20  CREATININE 1.58* 2.23*  CALCIUM 9.6 8.7*   GFR: Estimated Creatinine Clearance: 43.3 mL/min (by C-G formula based on Cr of 2.23). Liver Function Tests:  Recent Labs Lab 06/26/16 0336  AST 76*  ALT 61  ALKPHOS 84  BILITOT 0.8  PROT 9.1*  ALBUMIN 3.4*    Recent Labs Lab 06/26/16 0336  LIPASE 33   No results for input(s): AMMONIA in the last 168 hours. Coagulation Profile: No results for input(s): INR, PROTIME in the last 168 hours. Cardiac Enzymes:  Recent Labs Lab 06/26/16 0336 06/26/16 1257 06/26/16 2001  TROPONINI 0.16* 0.13* 0.12*   BNP (last 3 results) No results for input(s): PROBNP in the last 8760 hours. HbA1C: No results for input(s): HGBA1C in the last 72 hours. CBG:  Recent Labs Lab 06/26/16 1117 06/26/16 1601 06/26/16 2122 06/27/16 0721 06/27/16 1125  GLUCAP 236* 325* 275* 256* 194*   Lipid Profile: No results for input(s): CHOL, HDL, LDLCALC, TRIG, CHOLHDL, LDLDIRECT in the last 72 hours. Thyroid Function Tests: No results for input(s): TSH, T4TOTAL, FREET4, T3FREE, THYROIDAB in the last 72 hours. Anemia Panel: No results for input(s): VITAMINB12, FOLATE, FERRITIN, TIBC,  IRON, RETICCTPCT in the last 72 hours. Sepsis Labs: No results for input(s): PROCALCITON, LATICACIDVEN in the last 168 hours.  No results found for this or any previous visit (from the past 240 hour(s)).       Radiology Studies: Ct Abdomen Pelvis Wo Contrast  06/26/2016  CLINICAL DATA:  Initial evaluation for acute nausea a, right upper quadrant pain. EXAM: CT ABDOMEN AND PELVIS WITHOUT CONTRAST TECHNIQUE: Multidetector CT imaging of the abdomen and pelvis was performed following the standard protocol without IV contrast. COMPARISON:  Prior ultrasound from earlier the same day. FINDINGS: Minimal subsegmental atelectasis seen dependently within the visualized lung bases. Visualized lungs are otherwise clear. Coronary artery calcifications are partially visualized. Trace pericardial fluid noted. No pleural effusion. Liver demonstrates a nodular contour, compatible with cirrhosis. Evaluation for focal intrahepatic mass limited on this noncontrast examination. Possible gastrohepatic varices noted. Gallbladder within normal limits. No biliary dilatation. Spleen within normal limits for size and appearance. Adrenal glands and pancreas demonstrate a normal unenhanced appearance. Kidneys are equal in size without evidence of nephrolithiasis or hydronephrosis. Subcentimeter hypodense lesion within the left  kidney too small the characterize on this exam, but may reflect a small cyst. No radiopaque calculi seen along the course of either renal collecting system. There is no hydroureter. Stomach within normal limits. No evidence for bowel obstruction. No abnormal wall thickening or inflammatory fat stranding seen about the bowels. Appendix well visualized in the right lower quadrant and is of normal caliber and appearance associated inflammatory changes to suggest acute appendicitis. Mild circumferential bladder wall thickening, suspected to be related incomplete distension and/or chronic outlet obstruction. Prostate  is enlarged with median lobe hypertrophy. Prostate measures 5.7 cm in transverse diameter. No free air or fluid. No ascites. Few mildly prominent porta hepatis nodes measure up to 9 mm, likely related underlying intrinsic liver disease. No pathologically enlarged intra-abdominal or pelvic lymph nodes identified. Scattered aorto bi-iliac atherosclerotic calcifications, moderate in nature. Aorta measures up to 3.1 cm in greatest diameter just above the takeoff of the celiac axis. Scattered soft tissue density within the subcutaneous fat of the anterior abdomen, likely related to injection sites. No acute osseous abnormality. No worrisome lytic or blastic osseous lesions. IMPRESSION: 1. No acute intra-abdominal or pelvic process identified. 2. Nodular contour of the liver, consistent with cirrhosis. 3. Enlarged prostate. Mild circumferential bladder wall thickening suspected to be related to chronic outlet obstruction. 4. Coronary artery calcifications with moderate aorto bi-iliac atherosclerotic disease. Intra-abdominal aorta measures up to 3.1 cm in diameter. Recommend followup by ultrasound in 3 years. This recommendation follows ACR consensus guidelines: White Paper of the ACR Incidental Findings Committee II on Vascular Findings. [facility] Radiol 2013; 10:789-794 Electronically Signed   By: Jeannine Boga M.D.   On: 06/26/2016 06:25   Dg Abd Acute W/chest  06/26/2016  CLINICAL DATA:  Acute onset of generalized abdominal pain and nausea. Initial encounter. EXAM: DG ABDOMEN ACUTE W/ 1V CHEST COMPARISON:  Chest radiograph and CTA of the chest performed 05/27/2016, and abdominal ultrasound performed 04/26/2012 FINDINGS: The lungs are well-aerated. Pulmonary vascularity is at the upper limits of normal. There is no evidence of focal opacification, pleural effusion or pneumothorax. The cardiomediastinal silhouette is within normal limits. The visualized bowel gas pattern is unremarkable. Scattered stool and air  are seen within the colon; there is no evidence of small bowel dilatation to suggest obstruction. No free intra-abdominal air is identified on the provided upright view. No acute osseous abnormalities are seen; the sacroiliac joints are unremarkable in appearance. IMPRESSION: 1. Unremarkable bowel gas pattern; no free intra-abdominal air seen. Small amount of stool noted in the colon. 2. No acute cardiopulmonary process seen. Electronically Signed   By: Garald Balding M.D.   On: 06/26/2016 04:04   US Abdomen Limited Ruq  06/26/2016  CLINICAL DATA:  Acute onset of right upper quadrant abdominal pain. Initial encounter. EXAM: US ABDOMEN LIMITED - RIGHT UPPER QUADRANT COMPARISON:  Abdominal ultrasound performed 04/26/2012 FINDINGS: Gallbladder: No gallstones or wall thickening visualized. No sonographic Murphy sign noted by sonographer. Common bile duct: Diameter: 0.3 cm, within normal limits in caliber. Liver: No focal lesion identified. The mildly nodular contour of the liver raises question for mild hepatic cirrhosis. Parenchymal echogenicity is mildly heterogeneous. IMPRESSION: 1. No acute abnormality seen at the right upper quadrant. 2. Mildly nodular contour of the liver raises question for mild hepatic cirrhosis. Would correlate with LFTs. Electronically Signed   By: Garald Balding M.D.   On: 06/26/2016 05:01        Scheduled Meds: . amitriptyline  75 mg Oral QHS  .  aspirin EC  81 mg Oral Daily  . atorvastatin  10 mg Oral q1800  . carvedilol  12.5 mg Oral BID WC  . cloNIDine  0.2 mg Oral BID  . enoxaparin (LOVENOX) injection  40 mg Subcutaneous Q24H  . folic acid  1 mg Oral Daily  . gabapentin  300 mg Oral TID  . hydrALAZINE  50 mg Oral TID  . insulin aspart  0-15 Units Subcutaneous TID WC  . insulin aspart  0-5 Units Subcutaneous QHS  . insulin glargine  30 Units Subcutaneous BID  . multivitamin with minerals  1 tablet Oral Daily  . pantoprazole  40 mg Oral BID  . sertraline  100 mg Oral  Daily  . thiamine  100 mg Oral Daily   Continuous Infusions: . sodium chloride 10 mL/hr (06/26/16 0751)        Time spent: 25 min    Kelvin Cellar, MD [facility] Pager [phone]  If 7PM-7AM, please contact night-coverage [email or web address] Password [email or web address] 06/27/2016, 2:56 PM

## 2016-06-27 NOTE — Care Management Note (Addendum)
Case Management Note  Patient Details  Name: Brad [name] MRN: [id number] Date of Birth: 1961/08/13  Subjective/Objective:       Diabetes, HTN, diabetic neuropathy             Action/Plan: Discharge Planning:  NCM spoke to pt and states he is established at the [facility]. He gets his medications for free at clinic. Pt has blood pressure medication, and insulin at home. Pt has appt at [facility] on 07/02/2016 at 11 am to follow up on financial counselor and they will arrange appt for follow up with PCP. Has applied for disability and currently has attorney to help with appeal.   PCP- Chari Manning MD  Expected Discharge Date:  06/27/16               Expected Discharge Plan:  Home/Self Care  In-House Referral:  NA  Discharge planning Services  CM Consult, [facility] / [facility] (established/new)  Post Acute Care Choice:  NA Choice offered to:  NA  DME Arranged:  N/A DME Agency:  NA  HH Arranged:  NA HH Agency:  NA  Status of Service:  Completed, signed off  If discussed at [place] of Stay Meetings, dates discussed:    Additional Comments:  Erenest Rasher, RN 06/27/2016, 5:19 PM

## 2016-06-28 LAB — BASIC METABOLIC PANEL
Anion gap: 4 — ABNORMAL LOW (ref 5–15)
BUN: 23 mg/dL — ABNORMAL HIGH (ref 6–20)
CO2: 31 mmol/L (ref 22–32)
Calcium: 9.2 mg/dL (ref 8.9–10.3)
Chloride: 98 mmol/L — ABNORMAL LOW (ref 101–111)
Creatinine, Ser: 1.99 mg/dL — ABNORMAL HIGH (ref 0.61–1.24)
GFR calc Af Amer: 41 mL/min — ABNORMAL LOW (ref >60)
GFR calc non Af Amer: 36 mL/min — ABNORMAL LOW (ref >60)
Glucose, Bld: 264 mg/dL — ABNORMAL HIGH (ref 65–99)
Potassium: 4.4 mmol/L (ref 3.5–5.1)
Sodium: 133 mmol/L — ABNORMAL LOW (ref 135–145)

## 2016-06-28 LAB — GLUCOSE, CAPILLARY
Glucose-Capillary: 299 mg/dL — ABNORMAL HIGH (ref 65–99)
Glucose-Capillary: 161 mg/dL — ABNORMAL HIGH (ref 65–99)

## 2016-06-28 LAB — CBC
HCT: 41.9 % (ref 39.0–52.0)
Hemoglobin: 14.1 g/dL (ref 13.0–17.0)
MCH: 32.5 pg (ref 26.0–34.0)
MCHC: 33.7 g/dL (ref 30.0–36.0)
MCV: 96.5 fL (ref 78.0–100.0)
Platelets: 128 10*3/uL — ABNORMAL LOW (ref 150–400)
RBC: 4.34 MIL/uL (ref 4.22–5.81)
RDW: 11.5 % (ref 11.5–15.5)
WBC: 4 10*3/uL (ref 4.0–10.5)

## 2016-06-28 MED ORDER — TRAMADOL HCL 50 MG PO TABS: 50.0000 mg | ORAL_TABLET | Freq: Four times a day (QID) | ORAL | Status: DC | PRN

## 2016-06-28 MED ORDER — HYDRALAZINE HCL 25 MG PO TABS: 25.0000 mg | ORAL_TABLET | Freq: Three times a day (TID) | ORAL | Status: DC

## 2016-06-28 MED ORDER — GABAPENTIN 300 MG PO CAPS: 300.0000 mg | ORAL_CAPSULE | Freq: Three times a day (TID) | ORAL | Status: DC

## 2016-06-28 MED ORDER — ATORVASTATIN CALCIUM 10 MG PO TABS
10.0000 mg | ORAL_TABLET | Freq: Every day | ORAL | Status: DC
Start: 2016-06-28 — End: 2016-09-20

## 2016-06-28 MED ORDER — INSULIN GLARGINE 100 UNIT/ML ~~LOC~~ SOLN: 40.0000 [IU] | Freq: Two times a day (BID) | SUBCUTANEOUS | Status: DC

## 2016-06-28 MED ORDER — AMITRIPTYLINE HCL 25 MG PO TABS: 75.0000 mg | ORAL_TABLET | Freq: Every day | ORAL | Status: DC

## 2016-06-28 MED ORDER — CARVEDILOL 12.5 MG PO TABS: 12.5000 mg | ORAL_TABLET | Freq: Two times a day (BID) | ORAL | Status: DC

## 2016-06-28 MED ORDER — CLONIDINE HCL 0.1 MG PO TABS: 0.1000 mg | ORAL_TABLET | Freq: Two times a day (BID) | ORAL | Status: DC
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
## 2016-06-28 NOTE — Progress Notes (Signed)
CM received call from RN as pt is followed by the [facility] but it is closed on the weekends and [facility] letter was requested.  CM gave pt [place] letter with list of participating pharmacies and pt verbalized understanding all parameters of [place].  No other CM needs were communicated.

## 2016-06-30 MED FILL — !LANTUS SOLOSTAR 100UNITS/M: 100 | 18 days supply | Qty: 15 | Fill #0

## 2016-06-30 MED FILL — AMITRIPTYLINE HCL 25 MG TAB: 25 | 30 days supply | Qty: 90 | Fill #0

## 2016-06-30 MED FILL — GABAPENTIN 300 MG CAPSULE: 300 | 30 days supply | Qty: 90 | Fill #0

## 2016-06-30 MED FILL — cloNIDine HCL 0.1 MG TABS: 0.1 | 30 days supply | Qty: 60 | Fill #0

## 2016-06-30 MED FILL — ATORVASTATIN 10 MG TABLET: 10 | 30 days supply | Qty: 30 | Fill #0

## 2016-06-30 MED FILL — hydrALAZINE HCL 25 MG TABS: 25 | 30 days supply | Qty: 90 | Fill #0

## 2016-06-30 MED FILL — CARVEDILOL 12.5 MG TABLET: 12.5 | 30 days supply | Qty: 60 | Fill #0

## 2016-07-01 ENCOUNTER — Emergency Department (HOSPITAL_COMMUNITY): Payer: Self-pay

## 2016-07-01 ENCOUNTER — Ambulatory Visit: Payer: Self-pay | Attending: Internal Medicine

## 2016-07-01 ENCOUNTER — Inpatient Hospital Stay (HOSPITAL_COMMUNITY)
Admission: EM | Admit: 2016-07-01 | Discharge: 2016-07-08 | DRG: 287 | Disposition: A | Discharge status: Home / Self Care | Payer: Self-pay | Attending: Internal Medicine | Admitting: Internal Medicine

## 2016-07-01 ENCOUNTER — Encounter (HOSPITAL_COMMUNITY): Payer: Self-pay

## 2016-07-01 DIAGNOSIS — F1410 Cocaine abuse, uncomplicated: Secondary | ICD-10-CM | POA: Diagnosis present

## 2016-07-01 DIAGNOSIS — Z794 Long term (current) use of insulin: Secondary | ICD-10-CM

## 2016-07-01 DIAGNOSIS — E1165 Type 2 diabetes mellitus with hyperglycemia: Secondary | ICD-10-CM | POA: Diagnosis present

## 2016-07-01 DIAGNOSIS — I5032 Chronic diastolic (congestive) heart failure: Secondary | ICD-10-CM | POA: Diagnosis present

## 2016-07-01 DIAGNOSIS — F1911 Other psychoactive substance abuse, in remission: Secondary | ICD-10-CM | POA: Diagnosis present

## 2016-07-01 DIAGNOSIS — E118 Type 2 diabetes mellitus with unspecified complications: Secondary | ICD-10-CM

## 2016-07-01 DIAGNOSIS — R7989 Other specified abnormal findings of blood chemistry: Secondary | ICD-10-CM

## 2016-07-01 DIAGNOSIS — Z720 Tobacco use: Secondary | ICD-10-CM

## 2016-07-01 DIAGNOSIS — E119 Type 2 diabetes mellitus without complications: Secondary | ICD-10-CM

## 2016-07-01 DIAGNOSIS — E1122 Type 2 diabetes mellitus with diabetic chronic kidney disease: Secondary | ICD-10-CM | POA: Diagnosis present

## 2016-07-01 DIAGNOSIS — I2510 Atherosclerotic heart disease of native coronary artery without angina pectoris: Secondary | ICD-10-CM | POA: Diagnosis present

## 2016-07-01 DIAGNOSIS — D696 Thrombocytopenia, unspecified: Secondary | ICD-10-CM | POA: Diagnosis present

## 2016-07-01 DIAGNOSIS — Z96652 Presence of left artificial knee joint: Secondary | ICD-10-CM | POA: Diagnosis present

## 2016-07-01 DIAGNOSIS — F32A Depression, unspecified: Secondary | ICD-10-CM | POA: Diagnosis present

## 2016-07-01 DIAGNOSIS — K219 Gastro-esophageal reflux disease without esophagitis: Secondary | ICD-10-CM | POA: Diagnosis present

## 2016-07-01 DIAGNOSIS — Z7982 Long term (current) use of aspirin: Secondary | ICD-10-CM

## 2016-07-01 DIAGNOSIS — I440 Atrioventricular block, first degree: Secondary | ICD-10-CM | POA: Diagnosis present

## 2016-07-01 DIAGNOSIS — E1140 Type 2 diabetes mellitus with diabetic neuropathy, unspecified: Secondary | ICD-10-CM | POA: Diagnosis present

## 2016-07-01 DIAGNOSIS — F1910 Other psychoactive substance abuse, uncomplicated: Secondary | ICD-10-CM | POA: Diagnosis present

## 2016-07-01 DIAGNOSIS — L409 Psoriasis, unspecified: Secondary | ICD-10-CM | POA: Diagnosis present

## 2016-07-01 DIAGNOSIS — R071 Chest pain on breathing: Secondary | ICD-10-CM | POA: Diagnosis present

## 2016-07-01 DIAGNOSIS — Z9119 Patient's noncompliance with other medical treatment and regimen: Secondary | ICD-10-CM

## 2016-07-01 DIAGNOSIS — F17210 Nicotine dependence, cigarettes, uncomplicated: Secondary | ICD-10-CM | POA: Diagnosis present

## 2016-07-01 DIAGNOSIS — R079 Chest pain, unspecified: Secondary | ICD-10-CM

## 2016-07-01 DIAGNOSIS — E1121 Type 2 diabetes mellitus with diabetic nephropathy: Secondary | ICD-10-CM | POA: Diagnosis present

## 2016-07-01 DIAGNOSIS — I5031 Acute diastolic (congestive) heart failure: Secondary | ICD-10-CM | POA: Insufficient documentation

## 2016-07-01 DIAGNOSIS — I10 Essential (primary) hypertension: Secondary | ICD-10-CM | POA: Diagnosis present

## 2016-07-01 DIAGNOSIS — R778 Other specified abnormalities of plasma proteins: Secondary | ICD-10-CM | POA: Diagnosis present

## 2016-07-01 DIAGNOSIS — N179 Acute kidney failure, unspecified: Secondary | ICD-10-CM

## 2016-07-01 DIAGNOSIS — N183 Chronic kidney disease, stage 3 unspecified: Secondary | ICD-10-CM | POA: Diagnosis present

## 2016-07-01 DIAGNOSIS — B182 Chronic viral hepatitis C: Secondary | ICD-10-CM | POA: Diagnosis present

## 2016-07-01 DIAGNOSIS — I130 Hypertensive heart and chronic kidney disease with heart failure and stage 1 through stage 4 chronic kidney disease, or unspecified chronic kidney disease: Principal | ICD-10-CM | POA: Diagnosis present

## 2016-07-01 DIAGNOSIS — D649 Anemia, unspecified: Secondary | ICD-10-CM | POA: Diagnosis present

## 2016-07-01 DIAGNOSIS — I248 Other forms of acute ischemic heart disease: Secondary | ICD-10-CM | POA: Diagnosis present

## 2016-07-01 DIAGNOSIS — F1120 Opioid dependence, uncomplicated: Secondary | ICD-10-CM | POA: Diagnosis present

## 2016-07-01 DIAGNOSIS — R1013 Epigastric pain: Secondary | ICD-10-CM

## 2016-07-01 DIAGNOSIS — F329 Major depressive disorder, single episode, unspecified: Secondary | ICD-10-CM | POA: Diagnosis present

## 2016-07-01 DIAGNOSIS — E876 Hypokalemia: Secondary | ICD-10-CM | POA: Diagnosis present

## 2016-07-01 DIAGNOSIS — I5042 Chronic combined systolic (congestive) and diastolic (congestive) heart failure: Secondary | ICD-10-CM | POA: Diagnosis present

## 2016-07-01 DIAGNOSIS — K7460 Unspecified cirrhosis of liver: Secondary | ICD-10-CM | POA: Diagnosis present

## 2016-07-01 DIAGNOSIS — I214 Non-ST elevation (NSTEMI) myocardial infarction: Secondary | ICD-10-CM | POA: Insufficient documentation

## 2016-07-01 DIAGNOSIS — I340 Nonrheumatic mitral (valve) insufficiency: Secondary | ICD-10-CM | POA: Diagnosis present

## 2016-07-01 HISTORY — DX: Chronic diastolic (congestive) heart failure: I50.32

## 2016-07-01 LAB — CREATININE, SERUM
Creatinine, Ser: 2.32 mg/dL — ABNORMAL HIGH (ref 0.61–1.24)
GFR calc Af Amer: 34 mL/min — ABNORMAL LOW (ref >60)
GFR calc non Af Amer: 30 mL/min — ABNORMAL LOW (ref >60)

## 2016-07-01 LAB — I-STAT TROPONIN, ED: Troponin i, poc: 0.11 ng/mL (ref 0.00–0.08)

## 2016-07-01 LAB — GLUCOSE, CAPILLARY: Glucose-Capillary: 320 mg/dL — ABNORMAL HIGH (ref 65–99)

## 2016-07-01 LAB — BASIC METABOLIC PANEL
Anion gap: 13 (ref 5–15)
BUN: 20 mg/dL (ref 6–20)
CO2: 27 mmol/L (ref 22–32)
Calcium: 8.9 mg/dL (ref 8.9–10.3)
Chloride: 92 mmol/L — ABNORMAL LOW (ref 101–111)
Creatinine, Ser: 2.14 mg/dL — ABNORMAL HIGH (ref 0.61–1.24)
GFR calc Af Amer: 38 mL/min — ABNORMAL LOW (ref >60)
GFR calc non Af Amer: 33 mL/min — ABNORMAL LOW (ref >60)
Glucose, Bld: 239 mg/dL — ABNORMAL HIGH (ref 65–99)
Potassium: 3.1 mmol/L — ABNORMAL LOW (ref 3.5–5.1)
Sodium: 132 mmol/L — ABNORMAL LOW (ref 135–145)

## 2016-07-01 LAB — CBC
HCT: 46.1 % (ref 39.0–52.0)
HCT: 45 % (ref 39.0–52.0)
Hemoglobin: 16.4 g/dL (ref 13.0–17.0)
Hemoglobin: 15.7 g/dL (ref 13.0–17.0)
MCH: 33.5 pg (ref 26.0–34.0)
MCH: 33.1 pg (ref 26.0–34.0)
MCHC: 35.6 g/dL (ref 30.0–36.0)
MCHC: 34.9 g/dL (ref 30.0–36.0)
MCV: 94.3 fL (ref 78.0–100.0)
MCV: 94.9 fL (ref 78.0–100.0)
Platelets: 181 10*3/uL (ref 150–400)
Platelets: 171 10*3/uL (ref 150–400)
RBC: 4.89 MIL/uL (ref 4.22–5.81)
RBC: 4.74 MIL/uL (ref 4.22–5.81)
RDW: 11.6 % (ref 11.5–15.5)
RDW: 11.5 % (ref 11.5–15.5)
WBC: 8.7 10*3/uL (ref 4.0–10.5)
WBC: 7.5 10*3/uL (ref 4.0–10.5)

## 2016-07-01 LAB — BRAIN NATRIURETIC PEPTIDE: B Natriuretic Peptide: 266.8 pg/mL — ABNORMAL HIGH (ref 0.0–100.0)

## 2016-07-01 LAB — ETHANOL: Alcohol, Ethyl (B): <5 mg/dL (ref <5)

## 2016-07-01 LAB — TROPONIN I: Troponin I: 0.11 ng/mL (ref <0.03)

## 2016-07-01 MED ORDER — INSULIN GLARGINE 100 UNIT/ML ~~LOC~~ SOLN
40.0000 [IU] | Freq: Two times a day (BID) | SUBCUTANEOUS | Status: DC
Start: 2016-07-01 — End: 2016-07-08
  Administered 2016-07-01 – 2016-07-08 (×13): 40 [IU] via SUBCUTANEOUS
  Filled 2016-07-01 (×15): qty 0.4

## 2016-07-01 MED ORDER — INSULIN ASPART 100 UNIT/ML ~~LOC~~ SOLN
0.0000 [IU] | Freq: Three times a day (TID) | SUBCUTANEOUS | Status: DC
  Administered 2016-07-02: 2 [IU] via SUBCUTANEOUS
  Administered 2016-07-02: 3 [IU] via SUBCUTANEOUS
  Administered 2016-07-02: 5 [IU] via SUBCUTANEOUS
  Administered 2016-07-03: 2 [IU] via SUBCUTANEOUS
  Administered 2016-07-03: 7 [IU] via SUBCUTANEOUS
  Administered 2016-07-04 (×2): 2 [IU] via SUBCUTANEOUS
  Administered 2016-07-05: 3 [IU] via SUBCUTANEOUS
  Administered 2016-07-05: 5 [IU] via SUBCUTANEOUS
  Administered 2016-07-05: 3 [IU] via SUBCUTANEOUS
  Administered 2016-07-06: 2 [IU] via SUBCUTANEOUS

## 2016-07-01 MED ORDER — ATORVASTATIN CALCIUM 10 MG PO TABS
10.0000 mg | ORAL_TABLET | Freq: Every day | ORAL | Status: DC
  Administered 2016-07-01 – 2016-07-08 (×8): 10 mg via ORAL
  Filled 2016-07-01 (×8): qty 1

## 2016-07-01 MED ORDER — GABAPENTIN 300 MG PO CAPS
300.0000 mg | ORAL_CAPSULE | Freq: Three times a day (TID) | ORAL | Status: DC
Start: 2016-07-01 — End: 2016-07-08
  Administered 2016-07-01 – 2016-07-08 (×20): 300 mg via ORAL
  Filled 2016-07-01 (×3): qty 1
  Filled 2016-07-01: qty 3
  Filled 2016-07-01 (×17): qty 1

## 2016-07-01 MED ORDER — SERTRALINE HCL 100 MG PO TABS
100.0000 mg | ORAL_TABLET | Freq: Every day | ORAL | Status: DC
  Administered 2016-07-01 – 2016-07-08 (×8): 100 mg via ORAL
  Filled 2016-07-01 (×8): qty 1

## 2016-07-01 MED ORDER — ASPIRIN 325 MG PO TABS
ORAL_TABLET | ORAL | Status: AC
  Filled 2016-07-01: qty 1

## 2016-07-01 MED ORDER — INSULIN ASPART 100 UNIT/ML ~~LOC~~ SOLN
0.0000 [IU] | Freq: Every day | SUBCUTANEOUS | Status: DC
Start: 2016-07-01 — End: 2016-07-06
  Administered 2016-07-01: 4 [IU] via SUBCUTANEOUS
  Administered 2016-07-02 – 2016-07-03 (×2): 2 [IU] via SUBCUTANEOUS
  Administered 2016-07-04: 3 [IU] via SUBCUTANEOUS

## 2016-07-01 MED ORDER — ASPIRIN 81 MG PO CHEW
81.0000 mg | CHEWABLE_TABLET | Freq: Every day | ORAL | Status: DC
  Administered 2016-07-02: 81 mg via ORAL
  Filled 2016-07-01: qty 1

## 2016-07-01 MED ORDER — ONDANSETRON HCL 4 MG/2ML IJ SOLN: 4.0000 mg | Freq: Four times a day (QID) | INTRAMUSCULAR | Status: DC | PRN

## 2016-07-01 MED ORDER — TRIAMCINOLONE ACETONIDE 0.1 % EX OINT
TOPICAL_OINTMENT | Freq: Every day | CUTANEOUS | Status: DC
  Administered 2016-07-02 – 2016-07-03 (×2): via TOPICAL
  Administered 2016-07-04: 1 via TOPICAL
  Administered 2016-07-05 – 2016-07-08 (×4): via TOPICAL
  Filled 2016-07-01 (×3): qty 15

## 2016-07-01 MED ORDER — TRAMADOL HCL 50 MG PO TABS
50.0000 mg | ORAL_TABLET | Freq: Four times a day (QID) | ORAL | Status: DC | PRN
  Administered 2016-07-01 – 2016-07-07 (×10): 50 mg via ORAL
  Filled 2016-07-01 (×12): qty 1

## 2016-07-01 MED ORDER — PANTOPRAZOLE SODIUM 40 MG PO TBEC
40.0000 mg | DELAYED_RELEASE_TABLET | Freq: Every day | ORAL | Status: DC
  Administered 2016-07-01 – 2016-07-08 (×8): 40 mg via ORAL
  Filled 2016-07-01 (×8): qty 1

## 2016-07-01 MED ORDER — ASPIRIN EC 325 MG PO TBEC: 325.0000 mg | DELAYED_RELEASE_TABLET | Freq: Once | ORAL | Status: DC

## 2016-07-01 MED ORDER — ASPIRIN 325 MG PO TABS
325.0000 mg | ORAL_TABLET | Freq: Every day | ORAL | Status: DC
  Administered 2016-07-01: 325 mg via ORAL

## 2016-07-01 MED ORDER — GI COCKTAIL ~~LOC~~
30.0000 mL | Freq: Two times a day (BID) | ORAL | Status: DC | PRN
  Administered 2016-07-01: 30 mL via ORAL
  Filled 2016-07-01: qty 30

## 2016-07-01 MED ORDER — NITROGLYCERIN 0.4 MG SL SUBL
0.4000 mg | SUBLINGUAL_TABLET | SUBLINGUAL | Status: DC | PRN
  Administered 2016-07-01 – 2016-07-06 (×4): 0.4 mg via SUBLINGUAL
  Filled 2016-07-01 (×2): qty 1

## 2016-07-01 MED ORDER — CLONIDINE HCL 0.1 MG PO TABS
0.1000 mg | ORAL_TABLET | Freq: Two times a day (BID) | ORAL | Status: DC
Start: 2016-07-01 — End: 2016-07-08
  Administered 2016-07-01 – 2016-07-08 (×14): 0.1 mg via ORAL
  Filled 2016-07-01 (×14): qty 1

## 2016-07-01 MED ORDER — ENOXAPARIN SODIUM 30 MG/0.3ML ~~LOC~~ SOLN
30.0000 mg | SUBCUTANEOUS | Status: DC
  Administered 2016-07-01 – 2016-07-03 (×3): 30 mg via SUBCUTANEOUS
  Filled 2016-07-01 (×3): qty 0.3

## 2016-07-01 MED ORDER — POTASSIUM CHLORIDE CRYS ER 20 MEQ PO TBCR
40.0000 meq | EXTENDED_RELEASE_TABLET | Freq: Two times a day (BID) | ORAL | Status: DC
  Administered 2016-07-01: 40 meq via ORAL
  Filled 2016-07-01: qty 2

## 2016-07-01 MED ORDER — POTASSIUM CHLORIDE CRYS ER 20 MEQ PO TBCR
40.0000 meq | EXTENDED_RELEASE_TABLET | Freq: Once | ORAL | Status: AC
  Administered 2016-07-01: 40 meq via ORAL
  Filled 2016-07-01: qty 2

## 2016-07-01 MED ORDER — HYDRALAZINE HCL 25 MG PO TABS
25.0000 mg | ORAL_TABLET | Freq: Three times a day (TID) | ORAL | Status: DC
  Administered 2016-07-01 – 2016-07-08 (×20): 25 mg via ORAL
  Filled 2016-07-01 (×20): qty 1

## 2016-07-01 MED ORDER — MORPHINE SULFATE (PF) 4 MG/ML IV SOLN
4.0000 mg | Freq: Once | INTRAVENOUS | Status: AC
  Administered 2016-07-01: 4 mg via INTRAVENOUS
  Filled 2016-07-01: qty 1

## 2016-07-01 MED ORDER — CARVEDILOL 6.25 MG PO TABS
6.2500 mg | ORAL_TABLET | Freq: Two times a day (BID) | ORAL | Status: DC
  Administered 2016-07-01: 6.25 mg via ORAL
  Filled 2016-07-01: qty 1

## 2016-07-01 MED ORDER — AMITRIPTYLINE HCL 75 MG PO TABS
75.0000 mg | ORAL_TABLET | Freq: Every day | ORAL | Status: DC
Start: 2016-07-01 — End: 2016-07-08
  Administered 2016-07-01 – 2016-07-07 (×7): 75 mg via ORAL
  Filled 2016-07-01 (×7): qty 1

## 2016-07-01 MED ORDER — ACETAMINOPHEN 325 MG PO TABS
650.0000 mg | ORAL_TABLET | ORAL | Status: DC | PRN
  Administered 2016-07-06: 650 mg via ORAL
  Filled 2016-07-01: qty 2

## 2016-07-01 MED ORDER — ASPIRIN 81 MG PO TABS: 81.0000 mg | ORAL_TABLET | Freq: Every day | ORAL | Status: DC

## 2016-07-01 MED FILL — traMADol HCL 50 MG TABS: 50 | 2 days supply | Qty: 15 | Fill #0
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
## 2016-07-01 NOTE — Progress Notes (Signed)
Pt a/o,  c/o pain, PRN Ultram given as ordered, VSS, pt stable

## 2016-07-01 NOTE — ED Notes (Signed)
Paged Charlies Silvers MD regarding pt. Continued CP and telemetry status. MD ok to send pt. To telemetry floor at this time.

## 2016-07-01 NOTE — ED Notes (Signed)
Attempted report x1. 

## 2016-07-01 NOTE — ED Notes (Signed)
Patient states he developed shortness of breath after walking to [facility] today to pick up meds. Complains of right shoulder pain with tingling down right arm, no CP, hyperventilating on arrival.

## 2016-07-02 ENCOUNTER — Ambulatory Visit: Payer: Self-pay

## 2016-07-02 ENCOUNTER — Other Ambulatory Visit: Payer: Self-pay

## 2016-07-02 ENCOUNTER — Observation Stay (HOSPITAL_COMMUNITY): Payer: Self-pay

## 2016-07-02 DIAGNOSIS — K7460 Unspecified cirrhosis of liver: Secondary | ICD-10-CM

## 2016-07-02 DIAGNOSIS — R079 Chest pain, unspecified: Secondary | ICD-10-CM

## 2016-07-02 DIAGNOSIS — I5042 Chronic combined systolic (congestive) and diastolic (congestive) heart failure: Secondary | ICD-10-CM

## 2016-07-02 DIAGNOSIS — N183 Chronic kidney disease, stage 3 (moderate): Secondary | ICD-10-CM

## 2016-07-02 DIAGNOSIS — F1410 Cocaine abuse, uncomplicated: Secondary | ICD-10-CM | POA: Diagnosis present

## 2016-07-02 DIAGNOSIS — B182 Chronic viral hepatitis C: Secondary | ICD-10-CM

## 2016-07-02 LAB — NM MYOCAR MULTI W/SPECT W/WALL MOTION / EF
LV dias vol: 176 mL (ref 62–150)
LV sys vol: 114 mL
Peak HR: 82 {beats}/min
RATE: 0.33
Rest HR: 59 {beats}/min
SDS: 1
SRS: 0
SSS: 1
TID: 1.01

## 2016-07-02 LAB — RENAL FUNCTION PANEL
Albumin: 3.4 g/dL — ABNORMAL LOW (ref 3.5–5.0)
Anion gap: 9 (ref 5–15)
BUN: 31 mg/dL — ABNORMAL HIGH (ref 6–20)
CO2: 26 mmol/L (ref 22–32)
Calcium: 8.6 mg/dL — ABNORMAL LOW (ref 8.9–10.3)
Chloride: 96 mmol/L — ABNORMAL LOW (ref 101–111)
Creatinine, Ser: 2.42 mg/dL — ABNORMAL HIGH (ref 0.61–1.24)
GFR calc Af Amer: 33 mL/min — ABNORMAL LOW (ref >60)
GFR calc non Af Amer: 28 mL/min — ABNORMAL LOW (ref >60)
Glucose, Bld: 258 mg/dL — ABNORMAL HIGH (ref 65–99)
Phosphorus: 3.1 mg/dL (ref 2.5–4.6)
Potassium: 3.8 mmol/L (ref 3.5–5.1)
Sodium: 131 mmol/L — ABNORMAL LOW (ref 135–145)

## 2016-07-02 LAB — RAPID URINE DRUG SCREEN, HOSP PERFORMED
Amphetamines: NOT DETECTED
Barbiturates: NOT DETECTED
Benzodiazepines: NOT DETECTED
Cocaine: POSITIVE — AB
Opiates: POSITIVE — AB
Tetrahydrocannabinol: NOT DETECTED

## 2016-07-02 LAB — GLUCOSE, CAPILLARY
Glucose-Capillary: 240 mg/dL — ABNORMAL HIGH (ref 65–99)
Glucose-Capillary: 272 mg/dL — ABNORMAL HIGH (ref 65–99)
Glucose-Capillary: 160 mg/dL — ABNORMAL HIGH (ref 65–99)
Glucose-Capillary: 208 mg/dL — ABNORMAL HIGH (ref 65–99)

## 2016-07-02 LAB — HEMOGLOBIN A1C
Hgb A1c MFr Bld: 9.5 % — ABNORMAL HIGH (ref 4.8–5.6)
Mean Plasma Glucose: 226 mg/dL

## 2016-07-02 LAB — TROPONIN I
Troponin I: 0.08 ng/mL (ref <0.03)
Troponin I: 0.09 ng/mL (ref <0.03)

## 2016-07-02 LAB — MAGNESIUM: Magnesium: 1.7 mg/dL (ref 1.7–2.4)

## 2016-07-02 MED ORDER — ISOSORBIDE MONONITRATE ER 30 MG PO TB24
30.0000 mg | ORAL_TABLET | Freq: Every day | ORAL | Status: DC
  Administered 2016-07-02 – 2016-07-07 (×6): 30 mg via ORAL
  Filled 2016-07-02 (×6): qty 1

## 2016-07-02 MED ORDER — REGADENOSON 0.4 MG/5ML IV SOLN
INTRAVENOUS | Status: AC
  Filled 2016-07-02: qty 5

## 2016-07-02 MED ORDER — MORPHINE SULFATE (PF) 2 MG/ML IV SOLN
2.0000 mg | INTRAVENOUS | Status: DC | PRN
  Administered 2016-07-02 – 2016-07-06 (×4): 2 mg via INTRAVENOUS
  Filled 2016-07-02 (×4): qty 1

## 2016-07-02 MED ORDER — TECHNETIUM TC 99M TETROFOSMIN IV KIT
30.0000 | PACK | Freq: Once | INTRAVENOUS | Status: AC | PRN
  Administered 2016-07-02: 30 via INTRAVENOUS

## 2016-07-02 MED ORDER — ASPIRIN 81 MG PO CHEW
81.0000 mg | CHEWABLE_TABLET | Freq: Every day | ORAL | Status: DC
  Administered 2016-07-04 – 2016-07-08 (×5): 81 mg via ORAL
  Filled 2016-07-02 (×5): qty 1

## 2016-07-02 MED ORDER — ASPIRIN 81 MG PO CHEW
81.0000 mg | CHEWABLE_TABLET | ORAL | Status: AC
  Administered 2016-07-03: 81 mg via ORAL
  Filled 2016-07-02: qty 1

## 2016-07-02 MED ORDER — TECHNETIUM TC 99M TETROFOSMIN IV KIT
10.0000 | PACK | Freq: Once | INTRAVENOUS | Status: AC | PRN
  Administered 2016-07-02: 10 via INTRAVENOUS

## 2016-07-02 MED ORDER — SODIUM CHLORIDE 0.9 % IV SOLN
INTRAVENOUS | Status: DC
  Administered 2016-07-02 – 2016-07-07 (×4): via INTRAVENOUS

## 2016-07-02 MED ORDER — REGADENOSON 0.4 MG/5ML IV SOLN
0.4000 mg | Freq: Once | INTRAVENOUS | Status: AC
  Administered 2016-07-02: 0.4 mg via INTRAVENOUS

## 2016-07-02 NOTE — Progress Notes (Signed)
Patient: Brad [name] / Admit Date: 07/01/2016 / Date of Encounter: 07/02/2016, 8:56 AM   Subjective: Continued chronic chest pain. Says he does not know how the cocaine got in his system to make UDS +.    Objective: Telemetry: NSR occ PVCs Physical Exam: Blood pressure 144/96, pulse 58, temperature 98.2 F (36.8 C), temperature source Oral, resp. rate 18, height 5\' 11"  (1.803 m), weight 200 lb 9.6 oz (90.992 kg), SpO2 98 %. General: Well developed, well nourished AAM, in no acute distress. Head: Normocephalic, atraumatic, sclera non-icteric, no xanthomas, nares are without discharge. Neck: Negative for carotid bruits. JVP not elevated. Lungs: Clear bilaterally to auscultation without wheezes, rales, or rhonchi. Breathing is unlabored. Heart: RRR S1 S2 without murmurs, rubs, or gallops.  Abdomen: Soft, non-tender, non-distended with normoactive bowel sounds. No rebound/guarding. Extremities: No clubbing or cyanosis. No edema. Distal pedal pulses are 2+ and equal bilaterally. Neuro: Alert and oriented X 3. Moves all extremities spontaneously. Psych:  Responds to questions appropriately with a normal affect.   Intake/Output Summary (Last 24 hours) at 07/02/16 0856 Last data filed at 07/02/16 0546  Gross per 24 hour  Intake      0 ml  Output    300 ml  Net   -300 ml    Inpatient Medications:  . amitriptyline  75 mg Oral QHS  . aspirin  81 mg Oral Daily  . atorvastatin  10 mg Oral Daily  . cloNIDine  0.1 mg Oral BID  . enoxaparin (LOVENOX) injection  30 mg Subcutaneous Q24H  . gabapentin  300 mg Oral TID  . hydrALAZINE  25 mg Oral TID  . insulin aspart  0-5 Units Subcutaneous QHS  . insulin aspart  0-9 Units Subcutaneous TID WC  . insulin glargine  40 Units Subcutaneous BID  . pantoprazole  40 mg Oral Daily  . regadenoson      . regadenoson  0.4 mg Intravenous Once  . sertraline  100 mg Oral Daily  . triamcinolone ointment   Topical Daily   Infusions:     Labs:  Recent Labs  07/01/16 1216 07/01/16 1838  NA 132*  --   K 3.1*  --   CL 92*  --   CO2 27  --   GLUCOSE 239*  --   BUN 20  --   CREATININE 2.14* 2.32*  CALCIUM 8.9  --    No results for input(s): AST, ALT, ALKPHOS, BILITOT, PROT, ALBUMIN in the last 72 hours.  Recent Labs  07/01/16 1216 07/01/16 1838  WBC 8.7 7.5  HGB 16.4 15.7  HCT 46.1 45.0  MCV 94.3 94.9  PLT 181 171    Recent Labs  07/01/16 1838 07/02/16 0102 07/02/16 0555  TROPONINI 0.11* 0.08* 0.09*   Invalid input(s): POCBNP  Recent Labs  07/01/16 1838  HGBA1C 9.5*     Radiology/Studies:  Ct Abdomen Pelvis Wo Contrast  06/26/2016  CLINICAL DATA:  Initial evaluation for acute nausea a, right upper quadrant pain. EXAM: CT ABDOMEN AND PELVIS WITHOUT CONTRAST TECHNIQUE: Multidetector CT imaging of the abdomen and pelvis was performed following the standard protocol without IV contrast. COMPARISON:  Prior ultrasound from earlier the same day. FINDINGS: Minimal subsegmental atelectasis seen dependently within the visualized lung bases. Visualized lungs are otherwise clear. Coronary artery calcifications are partially visualized. Trace pericardial fluid noted. No pleural effusion. Liver demonstrates a nodular contour, compatible with cirrhosis. Evaluation for focal intrahepatic mass limited on this noncontrast examination. Possible gastrohepatic varices noted. Gallbladder within  normal limits. No biliary dilatation. Spleen within normal limits for size and appearance. Adrenal glands and pancreas demonstrate a normal unenhanced appearance. Kidneys are equal in size without evidence of nephrolithiasis or hydronephrosis. Subcentimeter hypodense lesion within the left kidney too small the characterize on this exam, but may reflect a small cyst. No radiopaque calculi seen along the course of either renal collecting system. There is no hydroureter. Stomach within normal limits. No evidence for bowel obstruction. No  abnormal wall thickening or inflammatory fat stranding seen about the bowels. Appendix well visualized in the right lower quadrant and is of normal caliber and appearance associated inflammatory changes to suggest acute appendicitis. Mild circumferential bladder wall thickening, suspected to be related incomplete distension and/or chronic outlet obstruction. Prostate is enlarged with median lobe hypertrophy. Prostate measures 5.7 cm in transverse diameter. No free air or fluid. No ascites. Few mildly prominent porta hepatis nodes measure up to 9 mm, likely related underlying intrinsic liver disease. No pathologically enlarged intra-abdominal or pelvic lymph nodes identified. Scattered aorto bi-iliac atherosclerotic calcifications, moderate in nature. Aorta measures up to 3.1 cm in greatest diameter just above the takeoff of the celiac axis. Scattered soft tissue density within the subcutaneous fat of the anterior abdomen, likely related to injection sites. No acute osseous abnormality. No worrisome lytic or blastic osseous lesions. IMPRESSION: 1. No acute intra-abdominal or pelvic process identified. 2. Nodular contour of the liver, consistent with cirrhosis. 3. Enlarged prostate. Mild circumferential bladder wall thickening suspected to be related to chronic outlet obstruction. 4. Coronary artery calcifications with moderate aorto bi-iliac atherosclerotic disease. Intra-abdominal aorta measures up to 3.1 cm in diameter. Recommend followup by ultrasound in 3 years. This recommendation follows ACR consensus guidelines: White Paper of the ACR Incidental Findings Committee II on Vascular Findings. Nat[facility] Coll Radiol 2013; 10:789-794 Electronically Signed   By: Jeannine Boga M.D.   On: 06/26/2016 06:25   Dg Chest 2 View  07/01/2016  CLINICAL DATA:  Sweats and left upper extremity pain EXAM: CHEST  2 VIEW COMPARISON:  June 26, 2016 FINDINGS: There is slight scarring in the left lower lung zone. Lungs elsewhere are  clear. Heart size and pulmonary vascularity are normal. No adenopathy. There is calcification in the aortic arch region. There is degenerative change in the lower thoracic spine region. IMPRESSION: Mild scarring left lower lung zone. No edema or consolidation. Aortic atherosclerosis. Electronically Signed   By: Lowella Grip III M.D.   On: 07/01/2016 13:23   Dg Abd Acute W/chest  06/26/2016  CLINICAL DATA:  Acute onset of generalized abdominal pain and nausea. Initial encounter. EXAM: DG ABDOMEN ACUTE W/ 1V CHEST COMPARISON:  Chest radiograph and CTA of the chest performed 05/27/2016, and abdominal ultrasound performed 04/26/2012 FINDINGS: The lungs are well-aerated. Pulmonary vascularity is at the upper limits of normal. There is no evidence of focal opacification, pleural effusion or pneumothorax. The cardiomediastinal silhouette is within normal limits. The visualized bowel gas pattern is unremarkable. Scattered stool and air are seen within the colon; there is no evidence of small bowel dilatation to suggest obstruction. No free intra-abdominal air is identified on the provided upright view. No acute osseous abnormalities are seen; the sacroiliac joints are unremarkable in appearance. IMPRESSION: 1. Unremarkable bowel gas pattern; no free intra-abdominal air seen. Small amount of stool noted in the colon. 2. No acute cardiopulmonary process seen. Electronically Signed   By: Garald Balding M.D.   On: 06/26/2016 04:04   US Abdomen Limited Ruq  06/26/2016  CLINICAL DATA:  Acute onset of right upper quadrant abdominal pain. Initial encounter. EXAM: US ABDOMEN LIMITED - RIGHT UPPER QUADRANT COMPARISON:  Abdominal ultrasound performed 04/26/2012 FINDINGS: Gallbladder: No gallstones or wall thickening visualized. No sonographic Murphy sign noted by sonographer. Common bile duct: Diameter: 0.3 cm, within normal limits in caliber. Liver: No focal lesion identified. The mildly nodular contour of the liver raises  question for mild hepatic cirrhosis. Parenchymal echogenicity is mildly heterogeneous. IMPRESSION: 1. No acute abnormality seen at the right upper quadrant. 2. Mildly nodular contour of the liver raises question for mild hepatic cirrhosis. Would correlate with LFTs. Electronically Signed   By: Garald Balding M.D.   On: 06/26/2016 05:01     Assessment and Plan  10M with HTN, DM, NICM (EF of 50-55% on echo 05/27/16), CKD stage III, Hepatitis C, orthostatic hypotension, chronic diastolic dysfunction, tobacco abuse and prior drug abuse (heroin, cocaine), nonobstructive CAD by cath 09/2015 who was admitted with CP and recurrently elevated troponin in the setting of + cocaine UDS, labs also notable for AKI on CKD, hyponatremia, hypokalemia, uncontrolled DM 9.5.  1. Chest pain - persistent. Low grade troponin elevation persists from prior hospitalization - flat trend so ACS less likely. Continue ASA, statin. Hold beta blocker in the setting of acute cocaine abuse. Add Imdur following study. Await nuc results.  2. Cocaine abuse - cessation advised.  3. HTN - add Imdur instead of bb given cocaine use.  4. AKI on CKD with electrolyte abnormalties as above - per IM.   Signed, Melina Copa PA-C Pager: [phone]

## 2016-07-02 NOTE — Discharge Summary (Signed)
Physician Discharge Summary  Brad [name] [id number] DOB: 1961-05-08 DOA: 07/01/2016  PCP: Lance Bosch, NP  Admit date: 07/01/2016 Discharge date: 07/02/2016  Time spent: Greater than 30 minutes  Recommendations for Outpatient Follow-up:  1. Follow up with PCP within one week. Only resume Coreg at least 3 days of being off Cocaine use.   Discharge Diagnoses:  Principal Problem:   Chest pain Active Problems:   Heroin addiction ([place])   History of drug abuse   GERD (gastroesophageal reflux disease)   Depression   Chronic combined systolic and diastolic heart failure, NYHA class 1 (HCC)   Polysubstance abuse   Chronic hepatitis C with cirrhosis (HCC)   Elevated troponin   Benign essential HTN   Uncontrolled diabetes mellitus with diabetic nephropathy, with long-term current use of insulin (HCC)   Hypokalemia   CKD (chronic kidney disease) stage 3, GFR 30-59 ml/min   Painful diabetic neuropathy ([place])   Cocaine abuse   Discharge Condition: Stable  Diet recommendation: Diabetic/Cardiac  Filed Weights   07/01/16 1211 07/01/16 1825 07/02/16 0531  Weight: 94.348 kg (208 lb) 89.9 kg (198 lb 3.1 oz) 90.992 kg (200 lb 9.6 oz)    History of present illness: 55 y.o. male with past medical history significant for hypertension, dyslipidemia, insulin-dependent diabetes, nonischemic cardiomyopathy with ejection fraction of 50% on 2-D echo in June 2297, chronic diastolic dysfunction, prior drug abuse including heroin addiction. He was just hospitalized from 06/26/2016 through 06/28/2016 with elevated troponin level at which time he did not have ischemic changes on 12-lead EKG and cardiology elected no further ischemic workup. Patient had previous hospitalization back in June 2017 with syncope and orthostatic hypotension. His urine drug screen was positive for cocaine at that time. His medications were adjusted and he was subsequently discharged. Patient presents with ongoing chest pain  in the left sternal area. Chest pain had persisted for 3 days prior to admission. Patient also reported associated shortness of breath with exertion. BNP was 266. Alcohol level was within normal limits. The 12-lead EKG showed sinus rhythm.  Hospital Course: Admitted for chest pain work up. Cardiac enzymes were cycled. Urine drug screening was positive for cocaine. Patient underwent cardiac stress test, and will be discharged back home if the cardiac stress test is non revealing. Patient has also being counseled to quit cocaine use.  Procedures:  Cardiac stress test.  Consultations:  Cardiology  Discharge Exam: Filed Vitals:   07/02/16 0906 07/02/16 0907  BP: 118/78   Pulse: 77 74  Temp:    Resp:      General: Not in distress. Awake and alert, oriented to time, place and person. Cardiovascular: S1S2 Respiratory: Clear to auscultation  Discharge Instructions   Discharge Instructions    Diet - low sodium heart healthy    Complete by:  As directed      Diet Carb Modified    Complete by:  As directed      Discharge instructions    Complete by:  As directed   Only resume coreg 3 days after being off of Cocaine. Follow up with PCP within one week     Increase activity slowly    Complete by:  As directed           Current Discharge Medication List    CONTINUE these medications which have NOT CHANGED   Details  amitriptyline (ELAVIL) 25 MG tablet Take 3 tablets (75 mg total) by mouth at bedtime. Must have office visit for refills Qty: 90  tablet, Refills: 0    aspirin 81 MG tablet Take 81 mg by mouth daily.    glucose 4 GM chewable tablet Chew 1 tablet by mouth daily as needed for low blood sugar.    glucose monitoring kit (FREESTYLE) monitoring kit 1 each by Does not apply route as needed for other. Qty: 1 each, Refills: 0    mometasone (ELOCON) 0.1 % ointment Apply topically daily. Qty: 45 g, Refills: 1   Associated Diagnoses: Psoriasis    omeprazole (PRILOSEC) 20  MG capsule Take 1 capsule (20 mg total) by mouth daily. Qty: 30 capsule, Refills: 0    sertraline (ZOLOFT) 100 MG tablet Take 1.5 tablets (150 mg total) by mouth daily. Qty: 30 tablet, Refills: 2    sildenafil (REVATIO) 20 MG tablet Take 2-5 tablets by mouth as needed for sexual activity. Qty: 50 tablet, Refills: 0    atorvastatin (LIPITOR) 10 MG tablet Take 1 tablet (10 mg total) by mouth daily. Qty: 30 tablet, Refills: 2    cloNIDine (CATAPRES) 0.1 MG tablet Take 1 tablet (0.1 mg total) by mouth 2 (two) times daily. Qty: 60 tablet, Refills: 11    gabapentin (NEURONTIN) 300 MG capsule Take 1 capsule (300 mg total) by mouth 3 (three) times daily. Qty: 90 capsule, Refills: 2    hydrALAZINE (APRESOLINE) 25 MG tablet Take 1 tablet (25 mg total) by mouth 3 (three) times daily. Qty: 60 tablet, Refills: 1    insulin glargine (LANTUS) 100 UNIT/ML injection Inject 0.4 mLs (40 Units total) into the skin 2 (two) times daily. Qty: 10 mL, Refills: 11      STOP taking these medications     carvedilol (COREG) 6.25 MG tablet      traMADol (ULTRAM) 50 MG tablet      carvedilol (COREG) 12.5 MG tablet        Allergies  Allergen Reactions  . Angiotensin Receptor Blockers Other (See Comments)    (Angioedema with lisinopril, therefore ARB's are contraindicated)  . Lisinopril Other (See Comments)    Angioedema  . Pamelor [Nortriptyline Hcl]     Swelling    Follow-up Information    Follow up with Lance Bosch, NP In 1 week.   Specialty:  Internal Medicine   Why:  Follow up chest pain       The results of significant diagnostics from this hospitalization (including imaging, microbiology, ancillary and laboratory) are listed below for reference.    Significant Diagnostic Studies: Ct Abdomen Pelvis Wo Contrast  06/26/2016  CLINICAL DATA:  Initial evaluation for acute nausea a, right upper quadrant pain. EXAM: CT ABDOMEN AND PELVIS WITHOUT CONTRAST TECHNIQUE: Multidetector CT imaging of  the abdomen and pelvis was performed following the standard protocol without IV contrast. COMPARISON:  Prior ultrasound from earlier the same day. FINDINGS: Minimal subsegmental atelectasis seen dependently within the visualized lung bases. Visualized lungs are otherwise clear. Coronary artery calcifications are partially visualized. Trace pericardial fluid noted. No pleural effusion. Liver demonstrates a nodular contour, compatible with cirrhosis. Evaluation for focal intrahepatic mass limited on this noncontrast examination. Possible gastrohepatic varices noted. Gallbladder within normal limits. No biliary dilatation. Spleen within normal limits for size and appearance. Adrenal glands and pancreas demonstrate a normal unenhanced appearance. Kidneys are equal in size without evidence of nephrolithiasis or hydronephrosis. Subcentimeter hypodense lesion within the left kidney too small the characterize on this exam, but may reflect a small cyst. No radiopaque calculi seen along the course of either renal collecting system. There is  no hydroureter. Stomach within normal limits. No evidence for bowel obstruction. No abnormal wall thickening or inflammatory fat stranding seen about the bowels. Appendix well visualized in the right lower quadrant and is of normal caliber and appearance associated inflammatory changes to suggest acute appendicitis. Mild circumferential bladder wall thickening, suspected to be related incomplete distension and/or chronic outlet obstruction. Prostate is enlarged with median lobe hypertrophy. Prostate measures 5.7 cm in transverse diameter. No free air or fluid. No ascites. Few mildly prominent porta hepatis nodes measure up to 9 mm, likely related underlying intrinsic liver disease. No pathologically enlarged intra-abdominal or pelvic lymph nodes identified. Scattered aorto bi-iliac atherosclerotic calcifications, moderate in nature. Aorta measures up to 3.1 cm in greatest diameter just  above the takeoff of the celiac axis. Scattered soft tissue density within the subcutaneous fat of the anterior abdomen, likely related to injection sites. No acute osseous abnormality. No worrisome lytic or blastic osseous lesions. IMPRESSION: 1. No acute intra-abdominal or pelvic process identified. 2. Nodular contour of the liver, consistent with cirrhosis. 3. Enlarged prostate. Mild circumferential bladder wall thickening suspected to be related to chronic outlet obstruction. 4. Coronary artery calcifications with moderate aorto bi-iliac atherosclerotic disease. Intra-abdominal aorta measures up to 3.1 cm in diameter. Recommend followup by ultrasound in 3 years. This recommendation follows ACR consensus guidelines: White Paper of the ACR Incidental Findings Committee II on Vascular Findings. Natasha [employer] Coll Radiol 2013; 10:789-794 Electronically Signed   By: Jeannine Boga M.D.   On: 06/26/2016 06:25   Dg Chest 2 View  07/01/2016  CLINICAL DATA:  Sweats and left upper extremity pain EXAM: CHEST  2 VIEW COMPARISON:  June 26, 2016 FINDINGS: There is slight scarring in the left lower lung zone. Lungs elsewhere are clear. Heart size and pulmonary vascularity are normal. No adenopathy. There is calcification in the aortic arch region. There is degenerative change in the lower thoracic spine region. IMPRESSION: Mild scarring left lower lung zone. No edema or consolidation. Aortic atherosclerosis. Electronically Signed   By: Lowella Grip III M.D.   On: 07/01/2016 13:23   Dg Abd Acute W/chest  06/26/2016  CLINICAL DATA:  Acute onset of generalized abdominal pain and nausea. Initial encounter. EXAM: DG ABDOMEN ACUTE W/ 1V CHEST COMPARISON:  Chest radiograph and CTA of the chest performed 05/27/2016, and abdominal ultrasound performed 04/26/2012 FINDINGS: The lungs are well-aerated. Pulmonary vascularity is at the upper limits of normal. There is no evidence of focal opacification, pleural effusion or  pneumothorax. The cardiomediastinal silhouette is within normal limits. The visualized bowel gas pattern is unremarkable. Scattered stool and air are seen within the colon; there is no evidence of small bowel dilatation to suggest obstruction. No free intra-abdominal air is identified on the provided upright view. No acute osseous abnormalities are seen; the sacroiliac joints are unremarkable in appearance. IMPRESSION: 1. Unremarkable bowel gas pattern; no free intra-abdominal air seen. Small amount of stool noted in the colon. 2. No acute cardiopulmonary process seen. Electronically Signed   By: Garald Balding M.D.   On: 06/26/2016 04:04   US Abdomen Limited Ruq  06/26/2016  CLINICAL DATA:  Acute onset of right upper quadrant abdominal pain. Initial encounter. EXAM: US ABDOMEN LIMITED - RIGHT UPPER QUADRANT COMPARISON:  Abdominal ultrasound performed 04/26/2012 FINDINGS: Gallbladder: No gallstones or wall thickening visualized. No sonographic Murphy sign noted by sonographer. Common bile duct: Diameter: 0.3 cm, within normal limits in caliber. Liver: No focal lesion identified. The mildly nodular contour of the liver raises  question for mild hepatic cirrhosis. Parenchymal echogenicity is mildly heterogeneous. IMPRESSION: 1. No acute abnormality seen at the right upper quadrant. 2. Mildly nodular contour of the liver raises question for mild hepatic cirrhosis. Would correlate with LFTs. Electronically Signed   By: Garald Balding M.D.   On: 06/26/2016 05:01    Microbiology: No results found for this or any previous visit (from the past 240 hour(s)).   Labs: Basic Metabolic Panel:  Recent Labs Lab 06/26/16 0336 06/27/16 0356 06/28/16 0409 07/01/16 1216 07/01/16 1838  NA 135 130* 133* 132*  --   K 4.0 3.7 4.4 3.1*  --   CL 99* 93* 98* 92*  --   CO2 '26 29 31 27  ' --   GLUCOSE 173* 305* 264* 239*  --   BUN 13 20 23* 20  --   CREATININE 1.58* 2.23* 1.99* 2.14* 2.32*  CALCIUM 9.6 8.7* 9.2 8.9  --     Liver Function Tests:  Recent Labs Lab 06/26/16 0336  AST 76*  ALT 61  ALKPHOS 84  BILITOT 0.8  PROT 9.1*  ALBUMIN 3.4*    Recent Labs Lab 06/26/16 0336  LIPASE 33   No results for input(s): AMMONIA in the last 168 hours. CBC:  Recent Labs Lab 06/26/16 0336 06/27/16 0356 06/28/16 0409 07/01/16 1216 07/01/16 1838  WBC 6.0 4.2 4.0 8.7 7.5  NEUTROABS 3.1  --   --   --   --   HGB 16.3 14.0 14.1 16.4 15.7  HCT 45.5 40.8 41.9 46.1 45.0  MCV 93.6 95.1 96.5 94.3 94.9  PLT 159 132* 128* 181 171   Cardiac Enzymes:  Recent Labs Lab 06/26/16 1257 06/26/16 2001 07/01/16 1838 07/02/16 0102 07/02/16 0555  TROPONINI 0.13* 0.12* 0.11* 0.08* 0.09*   BNP: BNP (last 3 results)  Recent Labs  07/01/16 1216  BNP 266.8*    ProBNP (last 3 results) No results for input(s): PROBNP in the last 8760 hours.  CBG:  Recent Labs Lab 06/27/16 2129 06/28/16 0744 06/28/16 1122 07/01/16 2200 07/02/16 0602  GLUCAP 231* 299* 161* 320* 240*       Signed:  Dana Allan, MD  [facility] Pager #: [phone] 7PM-7AM contact night coverage as above

## 2016-07-02 NOTE — Progress Notes (Addendum)
Orders received for pt discharge.  Discharge summary printed and reviewed with pt.  Explained medication regimen, and pt had no further questions at this time.  IV removed and site remains clean, dry, intact.  Telemetry removed.  Pt in stable condition and awaiting transport.  After further consideration and after reviewing stress test results, Dr. Debara Pickett has decided to keep pt in prep for cardiac cath scheduled for 07/03/16 at [facility].  Therefore, discharge has been cancelled for today.

## 2016-07-02 NOTE — Progress Notes (Signed)
I was notified by our director that the patient was upset that inquired about his cocaine use in front of clinical stress testing staff earlier today. He wanted her to know he was not mad, he just wanted to bring it to her attention that it hurt his feelings. To recap the scenario, I reviewed his chart prior to going down to perform the nuc. I arrived down in nuclear medicine, introduced myself, and asked how he was feeling. Then prior to starting the test, I explained that one of his tests showed cocaine in his system, he denied recent use, and then I began to explain the test and asked him to please let me know how he was feeling during the test. I spent <3 seconds discussing the positive UDS. I felt like this was important information for our stress test team to be aware of in case of an emergency.   I called the patient to formally apologize for the interaction - I let him know I was very sorry and meant no harm in inquiring about the cocaine positivity. I told him I was only approaching it from a medical standpoint as it affects the type of test we do and the type of medicines we can and cannot use in the event of an emergency and so forth. I apologized for discussing it in front of his clinical team. He verbalized understanding and appreciation and said he just didn't want the wrong person getting the information.  Tillie Viverette PA-C

## 2016-07-02 NOTE — Progress Notes (Signed)
Dr. Aundra Dubin made aware of patient c/o of 7/10 chest pain that radiates down right arm. This is unchanged from admission. STAT EKG done. Order received for Morphine 2mg  every 4 hours PRN. Will continue to closely monitor. Richarda Blade RN

## 2016-07-02 NOTE — Care Management Note (Signed)
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 CM talked to patient about his positive drug screen. He does not want any help from the Soc Worker to stop using drugs, patient stated " I am going to stop." He has a sponsor that he talks to, very little outside support . He recently lost his job. He goes to the [employer]. He used the [facility] ( Medication Assistance through [facility] ) in July 2017, he is not eligible for the program until next year in July. Patient can only use the program once a year. He can get his prescriptions filled at discharged at the [facility].  Expected Discharge Date:  07/04/16               Expected Discharge Plan:  Home/Self Care  In-House Referral:   Financial Counselor  Discharge planning Services  CM Consult  Choice offered to:  NA  Status of Service:  In process, will continue to follow  Sherrilyn Rist [id number] 07/02/2016, 10:24 AM

## 2016-07-02 NOTE — Progress Notes (Signed)
PROGRESS NOTE    Brad [name]  [id number] DOB: 04-24-1961 DOA: 07/01/2016 PCP: Lance Bosch, NP  Outpatient Specialists:   Brief Narrative: Brad [name] is a 55 y.o. male with past medical history significant for hypertension, dyslipidemia, insulin-dependent diabetes, nonischemic cardiomyopathy with ejection fraction of 50% on 2-D echo in June 0000000, chronic diastolic dysfunction, prior drug abuse including heroin addiction. He was just hospitalized from 06/26/2016 through 06/28/2016 with elevated troponin level at which time he did not have ischemic changes on 12-lead EKG and cardiology elected no further ischemic workup. Patient had previous hospitalization back in June 2017 with syncope and orthostatic hypotension. His urine drug screen was positive for cocaine at that time. His medications were adjusted and he was subsequently discharged. Patient now presents with ongoing chest pain in the left sternal area. He reports his pain is radiating to the right side, neck and down the right arm. Pain has been present since his last discharge 06/28/2016. Pain is constant, 7 out of 10 in intensity, present at rest and with exertion. Pain was little bit better with nitroglycerin and morphine given in ED. Patient also reports associated shortness of breath with exertion. No fevers or chills. No cough. No palpitations. No other complaints such as nausea, vomiting, abdominal pain, blood in the stool or urine. No reports of loss of consciousness.   Assessment & Plan:   Principal Problem:   Chest pain Active Problems:   Heroin addiction (HCC)   History of drug abuse   GERD (gastroesophageal reflux disease)   Depression   Chronic combined systolic and diastolic heart failure, NYHA class 1 (HCC)   Polysubstance abuse   Chronic hepatitis C with cirrhosis (HCC)   Elevated troponin   Benign essential HTN   Uncontrolled diabetes mellitus with diabetic nephropathy, with long-term current use of  insulin (HCC)   Hypokalemia   CKD (chronic kidney disease) stage 3, GFR 30-59 ml/min   Painful diabetic neuropathy (HCC)   Cocaine abuse  Chest pain - Cardiac stress test is abnormal. Will hold discharge. For possible cardiac cath in am. Likely Cocaine use - patient counseled.  GERD (gastroesophageal reflux disease) - Continue Protonix   Depression - Does not feel depressed at present - Continue Elavil and Zoloft   Chronic combined systolic and diastolic heart failure, NYHA class 1 (HCC) - Compensated - 2-D echo in June 2017 with ejection fraction 50-55%    Benign essential HTN - Continue carvedilol, clonidine, hydralazine   Uncontrolled diabetes mellitus with diabetic nephropathy, with long-term current use of insulin (HCC) - No recent A1c on file - Check A1c - Resume insulin per home regimen - Add SSI   Hypokalemia - Supplemented   CKD (chronic kidney disease) stage 3, GFR 30-59 ml/min - Baseline Cr 2.45 in 10/2015 - Cr on this admission 2.14, within baseline range    Painful diabetic neuropathy (HCC) - Resume Cogentin  DVT prophylaxis: Lovenox subQ Code Status: full code Family Communication: no family at the bedside  Disposition Plan: admission observation for chest pain evaluation  Consults called: cardiology, Dr. Daneen Schick Admission status: observation   Consultants:   Cardiology  Procedures:   Cardiac stress test  Antimicrobials:   None    Subjective: No new complaints. Admitted with chest pain  Objective: Filed Vitals:   07/02/16 0904 07/02/16 0906 07/02/16 0907 07/02/16 1212  BP: 111/80 118/78  100/67  Pulse: 81 77 74 78  Temp:    97.9 F (36.6 C)  TempSrc:  Oral  Resp:    16  Height:      Weight:      SpO2:    98%    Intake/Output Summary (Last 24 hours) at 07/02/16 1615 Last data filed at 07/02/16 1018  Gross per 24 hour  Intake    175 ml  Output    300 ml  Net   -125 ml   Filed Weights   07/01/16 1211  07/01/16 1825 07/02/16 0531  Weight: 94.348 kg (208 lb) 89.9 kg (198 lb 3.1 oz) 90.992 kg (200 lb 9.6 oz)    Examination:  General exam: Appears calm and comfortable  Respiratory system: Clear to auscultation. Respiratory effort normal. Cardiovascular system: S1 & S2 heard, RRR. No JVD, murmurs, rubs Gastrointestinal system: Abdomen is nondistended, soft and nontender.   Central nervous system: Alert and oriented. No focal neurological deficits. Extremities: Symmetric 5 x 5 power. No edema.   Data Reviewed: I have personally reviewed following labs and imaging studies  CBC:  Recent Labs Lab 06/26/16 0336 06/27/16 0356 06/28/16 0409 07/01/16 1216 07/01/16 1838  WBC 6.0 4.2 4.0 8.7 7.5  NEUTROABS 3.1  --   --   --   --   HGB 16.3 14.0 14.1 16.4 15.7  HCT 45.5 40.8 41.9 46.1 45.0  MCV 93.6 95.1 96.5 94.3 94.9  PLT 159 132* 128* 181 [id number]   Basic Metabolic Panel:  Recent Labs Lab 06/26/16 0336 06/27/16 0356 06/28/16 0409 07/01/16 1216 07/01/16 1838 07/02/16 1019  NA 135 130* 133* 132*  --  131*  K 4.0 3.7 4.4 3.1*  --  3.8  CL 99* 93* 98* 92*  --  96*  CO2 26 29 31 27   --  26  GLUCOSE 173* 305* 264* 239*  --  258*  BUN 13 20 23* 20  --  31*  CREATININE 1.58* 2.23* 1.99* 2.14* 2.32* 2.42*  CALCIUM 9.6 8.7* 9.2 8.9  --  8.6*  MG  --   --   --   --   --  1.7  PHOS  --   --   --   --   --  3.1   GFR: Estimated Creatinine Clearance: 39.3 mL/min (by C-G formula based on Cr of 2.42). Liver Function Tests:  Recent Labs Lab 06/26/16 0336 07/02/16 1019  AST 76*  --   ALT 61  --   ALKPHOS 84  --   BILITOT 0.8  --   PROT 9.1*  --   ALBUMIN 3.4* 3.4*    Recent Labs Lab 06/26/16 0336  LIPASE 33   No results for input(s): AMMONIA in the last 168 hours. Coagulation Profile: No results for input(s): INR, PROTIME in the last 168 hours. Cardiac Enzymes:  Recent Labs Lab 06/26/16 1257 06/26/16 2001 07/01/16 1838 07/02/16 0102 07/02/16 0555  TROPONINI 0.13*  0.12* 0.11* 0.08* 0.09*   BNP (last 3 results) No results for input(s): PROBNP in the last 8760 hours. HbA1C:  Recent Labs  07/01/16 1838  HGBA1C 9.5*   CBG:  Recent Labs Lab 06/28/16 0744 06/28/16 1122 07/01/16 2200 07/02/16 0602 07/02/16 1208  GLUCAP 299* 161* 320* 240* 272*   Lipid Profile: No results for input(s): CHOL, HDL, LDLCALC, TRIG, CHOLHDL, LDLDIRECT in the last 72 hours. Thyroid Function Tests: No results for input(s): TSH, T4TOTAL, FREET4, T3FREE, THYROIDAB in the last 72 hours. Anemia Panel: No results for input(s): VITAMINB12, FOLATE, FERRITIN, TIBC, IRON, RETICCTPCT in the last 72 hours. Urine analysis:  Component Value Date/Time   COLORURINE YELLOW 11/15/2015 0548   APPEARANCEUR CLEAR 11/15/2015 0548   LABSPEC 1.016 11/15/2015 0548   PHURINE 5.5 11/15/2015 0548   GLUCOSEU 250* 11/15/2015 0548   HGBUR NEGATIVE 11/15/2015 0548   BILIRUBINUR NEGATIVE 11/15/2015 0548   KETONESUR NEGATIVE 11/15/2015 0548   PROTEINUR 100* 11/15/2015 0548   UROBILINOGEN 1.0 04/26/2012 1328   NITRITE NEGATIVE 11/15/2015 0548   LEUKOCYTESUR NEGATIVE 11/15/2015 0548   Sepsis Labs: @LABRCNTIP (procalcitonin:4,lacticidven:4)  )No results found for this or any previous visit (from the past 240 hour(s)).       Radiology Studies: Dg Chest 2 View  07/01/2016  CLINICAL DATA:  Sweats and left upper extremity pain EXAM: CHEST  2 VIEW COMPARISON:  June 26, 2016 FINDINGS: There is slight scarring in the left lower lung zone. Lungs elsewhere are clear. Heart size and pulmonary vascularity are normal. No adenopathy. There is calcification in the aortic arch region. There is degenerative change in the lower thoracic spine region. IMPRESSION: Mild scarring left lower lung zone. No edema or consolidation. Aortic atherosclerosis. Electronically Signed   By: Lowella Grip III M.D.   On: 07/01/2016 13:23   Nm Myocar Multi W/spect W/wall Motion / Ef  07/02/2016   Defect 1: There is  a large defect of moderate severity present in the basal inferior, basal inferolateral, basal anterolateral, mid inferior, mid inferolateral and mid anterolateral location.  Findings consistent with ischemia.  This is an intermediate risk study.  Nuclear stress EF: 35%. There is anteroseptal/apical hypokinesis  Abnormal study. Relayed findings to [employer]. Candee Furbish, MD        Scheduled Meds: . amitriptyline  75 mg Oral QHS  . aspirin  81 mg Oral Daily  . atorvastatin  10 mg Oral Daily  . cloNIDine  0.1 mg Oral BID  . enoxaparin (LOVENOX) injection  30 mg Subcutaneous Q24H  . gabapentin  300 mg Oral TID  . hydrALAZINE  25 mg Oral TID  . insulin aspart  0-5 Units Subcutaneous QHS  . insulin aspart  0-9 Units Subcutaneous TID WC  . insulin glargine  40 Units Subcutaneous BID  . isosorbide mononitrate  30 mg Oral Daily  . pantoprazole  40 mg Oral Daily  . regadenoson      . sertraline  100 mg Oral Daily  . triamcinolone ointment   Topical Daily   Continuous Infusions:       Time spent: 30 Minutes    Dana Allan, MD  [facility] Pager #: [phone] 7PM-7AM contact night coverage as above

## 2016-07-02 NOTE — Progress Notes (Signed)
Discussion with the patient regarding his chest pain. Still has some low grade pain, not improved by nitroglycerin. Morphine has been helpful. Nuclear stress test indicated an EF of 35% (down from 50-55% in 05/2016) with anteroseptal and apical hypokinesis. There is a large, moderate intensity defect of the basal inferior, inferolateral, anterolateral, mid inferior and mid anterolateral areas consistent with ischemia. Interestingly, he had mild luminal irregularities with a non-dominant RCA by cath in 09/2015 in [place]. I discussed the risks, benefits and alternatives of cardiac catheterization, including the increased risk of worsening renal failure and potential for short or long term dialysis due to contrast load (has CKD 3). He understands this risk and our plan for hydration overnight for renal protection. He may need staged PCI if there is a stenosis.  [name], MD, [facility] Attending Cardiologist [place]

## 2016-07-03 ENCOUNTER — Encounter (HOSPITAL_COMMUNITY)
Admission: EM | Disposition: A | Discharge status: Home / Self Care | Payer: Self-pay | Source: Home / Self Care | Attending: Internal Medicine

## 2016-07-03 ENCOUNTER — Observation Stay (HOSPITAL_COMMUNITY): Payer: Self-pay

## 2016-07-03 DIAGNOSIS — F1910 Other psychoactive substance abuse, uncomplicated: Secondary | ICD-10-CM

## 2016-07-03 LAB — GLUCOSE, CAPILLARY
Glucose-Capillary: 179 mg/dL — ABNORMAL HIGH (ref 65–99)
Glucose-Capillary: 90 mg/dL (ref 65–99)
Glucose-Capillary: 315 mg/dL — ABNORMAL HIGH (ref 65–99)
Glucose-Capillary: 177 mg/dL — ABNORMAL HIGH (ref 65–99)
Glucose-Capillary: 218 mg/dL — ABNORMAL HIGH (ref 65–99)
Glucose-Capillary: 430 mg/dL — ABNORMAL HIGH (ref 65–99)

## 2016-07-03 LAB — URINE MICROSCOPIC-ADD ON

## 2016-07-03 LAB — URINALYSIS, ROUTINE W REFLEX MICROSCOPIC
Bilirubin Urine: NEGATIVE
Glucose, UA: NEGATIVE mg/dL
Hgb urine dipstick: NEGATIVE
Ketones, ur: NEGATIVE mg/dL
Leukocytes, UA: NEGATIVE
Nitrite: NEGATIVE
Protein, ur: 30 mg/dL — AB
Specific Gravity, Urine: 1.022 (ref 1.005–1.030)
pH: 5.5 (ref 5.0–8.0)

## 2016-07-03 LAB — PROTEIN / CREATININE RATIO, URINE
Creatinine, Urine: 192.15 mg/dL
Protein Creatinine Ratio: 0.35 mg/mg{Cre} — ABNORMAL HIGH (ref 0.00–0.15)
Total Protein, Urine: 68 mg/dL

## 2016-07-03 LAB — BASIC METABOLIC PANEL
Anion gap: 10 (ref 5–15)
BUN: 42 mg/dL — ABNORMAL HIGH (ref 6–20)
CO2: 23 mmol/L (ref 22–32)
Calcium: 8.4 mg/dL — ABNORMAL LOW (ref 8.9–10.3)
Chloride: 99 mmol/L — ABNORMAL LOW (ref 101–111)
Creatinine, Ser: 2.65 mg/dL — ABNORMAL HIGH (ref 0.61–1.24)
GFR calc Af Amer: 29 mL/min — ABNORMAL LOW (ref >60)
GFR calc non Af Amer: 25 mL/min — ABNORMAL LOW (ref >60)
Glucose, Bld: 116 mg/dL — ABNORMAL HIGH (ref 65–99)
Potassium: 3.9 mmol/L (ref 3.5–5.1)
Sodium: 132 mmol/L — ABNORMAL LOW (ref 135–145)

## 2016-07-03 LAB — PROTIME-INR
INR: 1.15 (ref 0.00–1.49)
Prothrombin Time: 14.9 seconds (ref 11.6–15.2)

## 2016-07-03 SURGERY — LEFT HEART CATH AND CORONARY ANGIOGRAPHY
Anesthesia: LOCAL

## 2016-07-03 NOTE — Progress Notes (Signed)
DAILY PROGRESS NOTE  Subjective:  No chest pain overnight. Despite hydration, his creatinine continues to rise.  Objective:  Temp:  [97.4 F (36.3 C)-97.9 F (36.6 C)] 97.4 F (36.3 C) (07/13 0522) Pulse Rate:  [67-78] 67 (07/13 0522) Resp:  [16] 16 (07/13 0522) BP: (87-136)/(56-100) 136/100 mmHg (07/13 0955) SpO2:  [98 %-100 %] 100 % (07/13 0522) Weight:  [205 lb 14.4 oz (93.396 kg)] 205 lb 14.4 oz (93.396 kg) (07/13 0522) Weight change: -2 lb 1.6 oz (-0.953 kg)  Intake/Output from previous day: 07/12 0701 - 07/13 0700 In: 1099.2 [P.O.:590; I.V.:509.2] Out: 300 [Urine:300]  Intake/Output from this shift: Total I/O In: 0  Out: 1 [Urine:1]  Medications: Current Facility-Administered Medications  Medication Dose Route Frequency Provider Last Rate Last Dose  . 0.9 %  sodium chloride infusion   Intravenous Continuous Dayna N Dunn, PA-C 50 mL/hr at 07/02/16 1949    . acetaminophen (TYLENOL) tablet 650 mg  650 mg Oral Q4H PRN Robbie Lis, MD      . amitriptyline (ELAVIL) tablet 75 mg  75 mg Oral QHS Robbie Lis, MD   75 mg at 07/02/16 2131  . [START ON 07/04/2016] aspirin chewable tablet 81 mg  81 mg Oral Daily Bonnell Public, MD      . atorvastatin (LIPITOR) tablet 10 mg  10 mg Oral Daily Robbie Lis, MD   10 mg at 07/03/16 0955  . cloNIDine (CATAPRES) tablet 0.1 mg  0.1 mg Oral BID Robbie Lis, MD   0.1 mg at 07/03/16 0955  . enoxaparin (LOVENOX) injection 30 mg  30 mg Subcutaneous Q24H Robbie Lis, MD   30 mg at 07/02/16 1955  . gabapentin (NEURONTIN) capsule 300 mg  300 mg Oral TID Robbie Lis, MD   300 mg at 07/03/16 0955  . hydrALAZINE (APRESOLINE) tablet 25 mg  25 mg Oral TID Robbie Lis, MD   25 mg at 07/03/16 0955  . insulin aspart (novoLOG) injection 0-5 Units  0-5 Units Subcutaneous QHS Robbie Lis, MD   2 Units at 07/02/16 2137  . insulin aspart (novoLOG) injection 0-9 Units  0-9 Units Subcutaneous TID WC Robbie Lis, MD   2 Units at 07/03/16  0755  . insulin glargine (LANTUS) injection 40 Units  40 Units Subcutaneous BID Robbie Lis, MD   40 Units at 07/02/16 2200  . isosorbide mononitrate (IMDUR) 24 hr tablet 30 mg  30 mg Oral Daily Dayna N Dunn, PA-C   30 mg at 07/03/16 0955  . morphine 2 MG/ML injection 2 mg  2 mg Intravenous Q4H PRN Larey Dresser, MD   2 mg at 07/03/16 0525  . nitroGLYCERIN (NITROSTAT) SL tablet 0.4 mg  0.4 mg Sublingual Q5 min PRN Harvel Quale, MD   0.4 mg at 07/01/16 1403  . ondansetron (ZOFRAN) injection 4 mg  4 mg Intravenous Q6H PRN Robbie Lis, MD      . pantoprazole (PROTONIX) EC tablet 40 mg  40 mg Oral Daily Robbie Lis, MD   40 mg at 07/03/16 0955  . sertraline (ZOLOFT) tablet 100 mg  100 mg Oral Daily Robbie Lis, MD   100 mg at 07/03/16 0956  . traMADol (ULTRAM) tablet 50 mg  50 mg Oral Q6H PRN Robbie Lis, MD   50 mg at 07/02/16 1954  . triamcinolone ointment (KENALOG) 0.1 %   Topical Daily Robbie Lis, MD  Physical Exam: General appearance: alert and no distress Lungs: clear to auscultation bilaterally Heart: regular rate and rhythm, S1, S2 normal, no murmur, click, rub or gallop Extremities: extremities normal, atraumatic, no cyanosis or edema Neurologic: Grossly normal  Lab Results: Results for orders placed or performed during the hospital encounter of 07/01/16 (from the past 48 hour(s))  Basic metabolic panel     Status: Abnormal   Collection Time: 07/01/16 12:16 PM  Result Value Ref Range   Sodium 132 (L) 135 - 145 mmol/L   Potassium 3.1 (L) 3.5 - 5.1 mmol/L   Chloride 92 (L) 101 - 111 mmol/L   CO2 27 22 - 32 mmol/L   Glucose, Bld 239 (H) 65 - 99 mg/dL   BUN 20 6 - 20 mg/dL   Creatinine, Ser 2.14 (H) 0.61 - 1.24 mg/dL   Calcium 8.9 8.9 - 10.3 mg/dL   GFR calc non Af Amer 33 (L) >60 mL/min   GFR calc Af Amer 38 (L) >60 mL/min    Comment: (NOTE) The eGFR has been calculated using the CKD EPI equation. This calculation has not been validated in all clinical  situations. eGFR's persistently <60 mL/min signify possible Chronic Kidney Disease.    Anion gap 13 5 - 15  CBC     Status: None   Collection Time: 07/01/16 12:16 PM  Result Value Ref Range   WBC 8.7 4.0 - 10.5 K/uL   RBC 4.89 4.22 - 5.81 MIL/uL   Hemoglobin 16.4 13.0 - 17.0 g/dL   HCT 46.1 39.0 - 52.0 %   MCV 94.3 78.0 - 100.0 fL   MCH 33.5 26.0 - 34.0 pg   MCHC 35.6 30.0 - 36.0 g/dL   RDW 11.6 11.5 - 15.5 %   Platelets 181 150 - 400 K/uL  Brain natriuretic peptide     Status: Abnormal   Collection Time: 07/01/16 12:16 PM  Result Value Ref Range   B Natriuretic Peptide 266.8 (H) 0.0 - 100.0 pg/mL  I-stat troponin, ED     Status: Abnormal   Collection Time: 07/01/16 12:25 PM  Result Value Ref Range   Troponin i, poc 0.11 (HH) 0.00 - 0.08 ng/mL   Comment NOTIFIED PHYSICIAN    Comment 3            Comment: Due to the release kinetics of cTnI, a negative result within the first hours of the onset of symptoms does not rule out myocardial infarction with certainty. If myocardial infarction is still suspected, repeat the test at appropriate intervals.   Ethanol     Status: None   Collection Time: 07/01/16  6:38 PM  Result Value Ref Range   Alcohol, Ethyl (B) <5 <5 mg/dL    Comment:        LOWEST DETECTABLE LIMIT FOR SERUM ALCOHOL IS 5 mg/dL FOR MEDICAL PURPOSES ONLY   Hemoglobin A1c     Status: Abnormal   Collection Time: 07/01/16  6:38 PM  Result Value Ref Range   Hgb A1c MFr Bld 9.5 (H) 4.8 - 5.6 %    Comment: (NOTE)         Pre-diabetes: 5.7 - 6.4         Diabetes: >6.4         Glycemic control for adults with diabetes: <7.0    Mean Plasma Glucose 226 mg/dL    Comment: (NOTE) Performed At: [facility] [place], [place] [id number] Lindon Romp MD [id number]   Troponin I-serum (one  time only)     Status: Abnormal   Collection Time: 07/01/16  6:38 PM  Result Value Ref Range   Troponin I 0.11 (HH) <0.03 ng/mL    Comment: CRITICAL  RESULT CALLED TO, READ BACK BY AND VERIFIED WITH: J BUSH,RN 1955 07/01/16 D BRADLEY   CBC     Status: None   Collection Time: 07/01/16  6:38 PM  Result Value Ref Range   WBC 7.5 4.0 - 10.5 K/uL   RBC 4.74 4.22 - 5.81 MIL/uL   Hemoglobin 15.7 13.0 - 17.0 g/dL   HCT 45.0 39.0 - 52.0 %   MCV 94.9 78.0 - 100.0 fL   MCH 33.1 26.0 - 34.0 pg   MCHC 34.9 30.0 - 36.0 g/dL   RDW 11.5 11.5 - 15.5 %   Platelets 171 150 - 400 K/uL  Creatinine, serum     Status: Abnormal   Collection Time: 07/01/16  6:38 PM  Result Value Ref Range   Creatinine, Ser 2.32 (H) 0.61 - 1.24 mg/dL   GFR calc non Af Amer 30 (L) >60 mL/min   GFR calc Af Amer 34 (L) >60 mL/min    Comment: (NOTE) The eGFR has been calculated using the CKD EPI equation. This calculation has not been validated in all clinical situations. eGFR's persistently <60 mL/min signify possible Chronic Kidney Disease.   Glucose, capillary     Status: Abnormal   Collection Time: 07/01/16 10:00 PM  Result Value Ref Range   Glucose-Capillary 320 (H) 65 - 99 mg/dL  Troponin I-serum (one time only)     Status: Abnormal   Collection Time: 07/02/16  1:02 AM  Result Value Ref Range   Troponin I 0.08 (HH) <0.03 ng/mL    Comment: CRITICAL VALUE NOTED.  VALUE IS CONSISTENT WITH PREVIOUSLY REPORTED AND CALLED VALUE.  Urine rapid drug screen (hosp performed)     Status: Abnormal   Collection Time: 07/02/16  5:45 AM  Result Value Ref Range   Opiates POSITIVE (A) NONE DETECTED   Cocaine POSITIVE (A) NONE DETECTED   Benzodiazepines NONE DETECTED NONE DETECTED   Amphetamines NONE DETECTED NONE DETECTED   Tetrahydrocannabinol NONE DETECTED NONE DETECTED   Barbiturates NONE DETECTED NONE DETECTED    Comment:        DRUG SCREEN FOR MEDICAL PURPOSES ONLY.  IF CONFIRMATION IS NEEDED FOR ANY PURPOSE, NOTIFY LAB WITHIN 5 DAYS.        LOWEST DETECTABLE LIMITS FOR URINE DRUG SCREEN Drug Class       Cutoff (ng/mL) Amphetamine      1000 Barbiturate       200 Benzodiazepine   161 Tricyclics       096 Opiates          300 Cocaine          300 THC              50   Troponin I-serum (one time only)     Status: Abnormal   Collection Time: 07/02/16  5:55 AM  Result Value Ref Range   Troponin I 0.09 (HH) <0.03 ng/mL    Comment: CRITICAL VALUE NOTED.  VALUE IS CONSISTENT WITH PREVIOUSLY REPORTED AND CALLED VALUE.  Glucose, capillary     Status: Abnormal   Collection Time: 07/02/16  6:02 AM  Result Value Ref Range   Glucose-Capillary 240 (H) 65 - 99 mg/dL  Renal function panel     Status: Abnormal   Collection Time: 07/02/16 10:19 AM  Result Value  Ref Range   Sodium 131 (L) 135 - 145 mmol/L   Potassium 3.8 3.5 - 5.1 mmol/L   Chloride 96 (L) 101 - 111 mmol/L   CO2 26 22 - 32 mmol/L   Glucose, Bld 258 (H) 65 - 99 mg/dL   BUN 31 (H) 6 - 20 mg/dL   Creatinine, Ser 2.42 (H) 0.61 - 1.24 mg/dL   Calcium 8.6 (L) 8.9 - 10.3 mg/dL   Phosphorus 3.1 2.5 - 4.6 mg/dL   Albumin 3.4 (L) 3.5 - 5.0 g/dL   GFR calc non Af Amer 28 (L) >60 mL/min   GFR calc Af Amer 33 (L) >60 mL/min    Comment: (NOTE) The eGFR has been calculated using the CKD EPI equation. This calculation has not been validated in all clinical situations. eGFR's persistently <60 mL/min signify possible Chronic Kidney Disease.    Anion gap 9 5 - 15  Magnesium     Status: None   Collection Time: 07/02/16 10:19 AM  Result Value Ref Range   Magnesium 1.7 1.7 - 2.4 mg/dL  Glucose, capillary     Status: Abnormal   Collection Time: 07/02/16 12:08 PM  Result Value Ref Range   Glucose-Capillary 272 (H) 65 - 99 mg/dL  Glucose, capillary     Status: Abnormal   Collection Time: 07/02/16  4:53 PM  Result Value Ref Range   Glucose-Capillary 160 (H) 65 - 99 mg/dL  Glucose, capillary     Status: Abnormal   Collection Time: 07/02/16  9:01 PM  Result Value Ref Range   Glucose-Capillary 208 (H) 65 - 99 mg/dL   Comment 1 Notify RN    Comment 2 Document in Chart   Basic metabolic panel      Status: Abnormal   Collection Time: 07/03/16  4:59 AM  Result Value Ref Range   Sodium 132 (L) 135 - 145 mmol/L   Potassium 3.9 3.5 - 5.1 mmol/L   Chloride 99 (L) 101 - 111 mmol/L   CO2 23 22 - 32 mmol/L   Glucose, Bld 116 (H) 65 - 99 mg/dL   BUN 42 (H) 6 - 20 mg/dL   Creatinine, Ser 2.65 (H) 0.61 - 1.24 mg/dL   Calcium 8.4 (L) 8.9 - 10.3 mg/dL   GFR calc non Af Amer 25 (L) >60 mL/min   GFR calc Af Amer 29 (L) >60 mL/min    Comment: (NOTE) The eGFR has been calculated using the CKD EPI equation. This calculation has not been validated in all clinical situations. eGFR's persistently <60 mL/min signify possible Chronic Kidney Disease.    Anion gap 10 5 - 15  Protime-INR     Status: None   Collection Time: 07/03/16  4:59 AM  Result Value Ref Range   Prothrombin Time 14.9 11.6 - 15.2 seconds   INR 1.15 0.00 - 1.49  Glucose, capillary     Status: Abnormal   Collection Time: 07/03/16  6:05 AM  Result Value Ref Range   Glucose-Capillary 179 (H) 65 - 99 mg/dL   Comment 1 Notify RN    Comment 2 Document in Chart     Imaging: Dg Chest 2 View  07/01/2016  CLINICAL DATA:  Sweats and left upper extremity pain EXAM: CHEST  2 VIEW COMPARISON:  June 26, 2016 FINDINGS: There is slight scarring in the left lower lung zone. Lungs elsewhere are clear. Heart size and pulmonary vascularity are normal. No adenopathy. There is calcification in the aortic arch region. There is degenerative change in  the lower thoracic spine region. IMPRESSION: Mild scarring left lower lung zone. No edema or consolidation. Aortic atherosclerosis. Electronically Signed   By: Lowella Grip III M.D.   On: 07/01/2016 13:23   Nm Myocar Multi W/spect W/wall Motion / Ef  07/02/2016   Defect 1: There is a large defect of moderate severity present in the basal inferior, basal inferolateral, basal anterolateral, mid inferior, mid inferolateral and mid anterolateral location.  Findings consistent with ischemia.  This is an  intermediate risk study.  Nuclear stress EF: 35%. There is anteroseptal/apical hypokinesis  Abnormal study. Relayed findings to [employer]. Candee Furbish, MD    Assessment:  1. Principal Problem: 2.   Chest pain 3. Active Problems: 4.   Heroin addiction ([place]) 5.   History of drug abuse 6.   GERD (gastroesophageal reflux disease) 7.   Depression 8.   Chronic combined systolic and diastolic heart failure, NYHA class 1 ([place]) 9.   Polysubstance abuse 10.   Chronic hepatitis C with cirrhosis ([place]) 11.   Elevated troponin 12.   Benign essential HTN 13.   Uncontrolled diabetes mellitus with diabetic nephropathy, with long-term current use of insulin ([place]) 14.   Hypokalemia 15.   CKD (chronic kidney disease) stage 3, GFR 30-59 ml/min 16.   Painful diabetic neuropathy ([place]) 17.   Cocaine abuse 18.   Plan:  1. Cancelled LHC today due to rising creatinine. Asked nephrology to kindly consult and assist with maximizing renal function. CT abdomen shows equal size kidneys without hydronephrosis or hydroureter. Ultimately will need LHC given abnormal myoview and ongoing chest pain symptoms, however, risk of contrast nephropathy is probably to great at this point.  Time Spent Directly with Patient:  15 minutes  Length of Stay:    [name], MD, [facility] Attending Cardiologist [place] 07/03/2016, 10:30 AM

## 2016-07-03 NOTE — Progress Notes (Signed)
Pt has seen the cardiac cath educational video.

## 2016-07-03 NOTE — Progress Notes (Signed)
PROGRESS NOTE    Brad [name]  [id number] DOB: 11/21/1961 DOA: 07/01/2016 PCP: Lance Bosch, NP  Outpatient Specialists:   Brief Narrative: Brad [name] is a 55 y.o. male with past medical history significant for hypertension, dyslipidemia, insulin-dependent diabetes, nonischemic cardiomyopathy with ejection fraction of 50% on 2-D echo in June 0000000, chronic diastolic dysfunction, prior drug abuse including heroin addiction. He was just hospitalized from 06/26/2016 through 06/28/2016 with elevated troponin level at which time he did not have ischemic changes on 12-lead EKG and cardiology elected no further ischemic workup. Patient had previous hospitalization back in June 2017 with syncope and orthostatic hypotension. His urine drug screen was positive for cocaine at that time. His medications were adjusted and he was subsequently discharged. Patient now presents with ongoing chest pain in the left sternal area. He reports his pain is radiating to the right side, neck and down the right arm. Pain has been present since his last discharge 06/28/2016.   This AM still having some substernal chest pain. Denies dizziness or lightheadedness while lying in bed.   Assessment & Plan:   Principal Problem:   Chest pain Active Problems:   Heroin addiction (HCC)   History of drug abuse   GERD (gastroesophageal reflux disease)   Depression   Chronic combined systolic and diastolic heart failure, NYHA class 1 (HCC)   Polysubstance abuse   Chronic hepatitis C with cirrhosis (HCC)   Elevated troponin   Benign essential HTN   Uncontrolled diabetes mellitus with diabetic nephropathy, with long-term current use of insulin (HCC)   Hypokalemia   CKD (chronic kidney disease) stage 3, GFR 30-59 ml/min   Painful diabetic neuropathy (HCC)   Cocaine abuse  Chest pain - Cardiac stress test is abnormal. Plan was in place for cardiac cath, but holding off this AM due to AKI. Nephrology has been  consulted.   Likely Cocaine use - patient counseled.  GERD (gastroesophageal reflux disease) - Continue Protonix   Depression - Continue Elavil and Zoloft   Chronic combined systolic and diastolic heart failure, NYHA class 1 (HCC) - Compensated - 2-D echo in June 2017 with ejection fraction 50-55%    Benign essential HTN - Continue carvedilol, clonidine, hydralazine   Uncontrolled diabetes mellitus with diabetic nephropathy, with long-term current use of insulin (HCC) - A1c 9.5 - Resume insulin per home regimen - Add SSI   Hypokalemia - Supplemented   CKD (chronic kidney disease) stage 3, GFR 30-59 ml/min - Baseline Cr 2.45 in 10/2015 - Cr on this admission 2.14, within baseline range    Painful diabetic neuropathy (HCC) - Resume Cogentin  DVT prophylaxis: Lovenox subQ Code Status: Full code Family Communication: no family at the bedside  Disposition Plan: admission observation for chest pain evaluation  Consults called: cardiology, Dr. Daneen Schick, nephrology  Admission status: observation   Consultants:   Cardiology  Nephrology  Procedures:   Cardiac stress test  Antimicrobials:   None    Objective: Filed Vitals:   07/02/16 2133 07/03/16 0005 07/03/16 0522 07/03/16 0955  BP: 126/81 113/72 121/81 136/100  Pulse:  71 67   Temp:  97.6 F (36.4 C) 97.4 F (36.3 C)   TempSrc:  Oral Oral   Resp:  16 16   Height:      Weight:   93.396 kg (205 lb 14.4 oz)   SpO2:  100% 100%     Intake/Output Summary (Last 24 hours) at 07/03/16 1031 Last data filed at 07/03/16 0835  Gross per  24 hour  Intake 924.17 ml  Output    301 ml  Net 623.17 ml   Filed Weights   07/01/16 1825 07/02/16 0531 07/03/16 0522  Weight: 89.9 kg (198 lb 3.1 oz) 90.992 kg (200 lb 9.6 oz) 93.396 kg (205 lb 14.4 oz)    Examination:  General exam: Appears calm and comfortable  Respiratory system: Clear to auscultation. Respiratory effort normal. Cardiovascular system:  S1 & S2 heard, RRR. No JVD, murmurs, rubs Gastrointestinal system: Abdomen is nondistended, soft and nontender.   Central nervous system: Alert and oriented. No focal neurological deficits. Extremities: Symmetric 5 x 5 power. No edema.   Data Reviewed: I have personally reviewed following labs and imaging studies  CBC:  Recent Labs Lab 06/27/16 0356 06/28/16 0409 07/01/16 1216 07/01/16 1838  WBC 4.2 4.0 8.7 7.5  HGB 14.0 14.1 16.4 15.7  HCT 40.8 41.9 46.1 45.0  MCV 95.1 96.5 94.3 94.9  PLT 132* 128* 181 [id number]   Basic Metabolic Panel:  Recent Labs Lab 06/27/16 0356 06/28/16 0409 07/01/16 1216 07/01/16 1838 07/02/16 1019 07/03/16 0459  NA 130* 133* 132*  --  131* 132*  K 3.7 4.4 3.1*  --  3.8 3.9  CL 93* 98* 92*  --  96* 99*  CO2 29 31 27   --  26 23  GLUCOSE 305* 264* 239*  --  258* 116*  BUN 20 23* 20  --  31* 42*  CREATININE 2.23* 1.99* 2.14* 2.32* 2.42* 2.65*  CALCIUM 8.7* 9.2 8.9  --  8.6* 8.4*  MG  --   --   --   --  1.7  --   PHOS  --   --   --   --  3.1  --    GFR: Estimated Creatinine Clearance: 36.3 mL/min (by C-G formula based on Cr of 2.65). Liver Function Tests:  Recent Labs Lab 07/02/16 1019  ALBUMIN 3.4*   No results for input(s): LIPASE, AMYLASE in the last 168 hours. No results for input(s): AMMONIA in the last 168 hours. Coagulation Profile:  Recent Labs Lab 07/03/16 0459  INR 1.15   Cardiac Enzymes:  Recent Labs Lab 06/26/16 1257 06/26/16 2001 07/01/16 1838 07/02/16 0102 07/02/16 0555  TROPONINI 0.13* 0.12* 0.11* 0.08* 0.09*   BNP (last 3 results) No results for input(s): PROBNP in the last 8760 hours. HbA1C:  Recent Labs  07/01/16 1838  HGBA1C 9.5*   CBG:  Recent Labs Lab 07/02/16 0602 07/02/16 1208 07/02/16 1653 07/02/16 2101 07/03/16 0605  GLUCAP 240* 272* 160* 208* 179*   Lipid Profile: No results for input(s): CHOL, HDL, LDLCALC, TRIG, CHOLHDL, LDLDIRECT in the last 72 hours. Thyroid Function Tests: No  results for input(s): TSH, T4TOTAL, FREET4, T3FREE, THYROIDAB in the last 72 hours. Anemia Panel: No results for input(s): VITAMINB12, FOLATE, FERRITIN, TIBC, IRON, RETICCTPCT in the last 72 hours. Urine analysis:    Component Value Date/Time   COLORURINE YELLOW 11/15/2015 0548   APPEARANCEUR CLEAR 11/15/2015 0548   LABSPEC 1.016 11/15/2015 0548   PHURINE 5.5 11/15/2015 0548   GLUCOSEU 250* 11/15/2015 0548   HGBUR NEGATIVE 11/15/2015 0548   BILIRUBINUR NEGATIVE 11/15/2015 0548   KETONESUR NEGATIVE 11/15/2015 0548   PROTEINUR 100* 11/15/2015 0548   UROBILINOGEN 1.0 04/26/2012 1328   NITRITE NEGATIVE 11/15/2015 0548   LEUKOCYTESUR NEGATIVE 11/15/2015 0548   Sepsis Labs: @LABRCNTIP (procalcitonin:4,lacticidven:4)  )No results found for this or any previous visit (from the past 240 hour(s)).       Radiology Studies:  Dg Chest 2 View  07/01/2016  CLINICAL DATA:  Sweats and left upper extremity pain EXAM: CHEST  2 VIEW COMPARISON:  June 26, 2016 FINDINGS: There is slight scarring in the left lower lung zone. Lungs elsewhere are clear. Heart size and pulmonary vascularity are normal. No adenopathy. There is calcification in the aortic arch region. There is degenerative change in the lower thoracic spine region. IMPRESSION: Mild scarring left lower lung zone. No edema or consolidation. Aortic atherosclerosis. Electronically Signed   By: Lowella Grip III M.D.   On: 07/01/2016 13:23   Nm Myocar Multi W/spect W/wall Motion / Ef  07/02/2016   Defect 1: There is a large defect of moderate severity present in the basal inferior, basal inferolateral, basal anterolateral, mid inferior, mid inferolateral and mid anterolateral location.  Findings consistent with ischemia.  This is an intermediate risk study.  Nuclear stress EF: 35%. There is anteroseptal/apical hypokinesis  Abnormal study. Relayed findings to [employer]. Candee Furbish, MD        Scheduled Meds: . amitriptyline  75 mg Oral  QHS  . [START ON 07/04/2016] aspirin  81 mg Oral Daily  . atorvastatin  10 mg Oral Daily  . cloNIDine  0.1 mg Oral BID  . enoxaparin (LOVENOX) injection  30 mg Subcutaneous Q24H  . gabapentin  300 mg Oral TID  . hydrALAZINE  25 mg Oral TID  . insulin aspart  0-5 Units Subcutaneous QHS  . insulin aspart  0-9 Units Subcutaneous TID WC  . insulin glargine  40 Units Subcutaneous BID  . isosorbide mononitrate  30 mg Oral Daily  . pantoprazole  40 mg Oral Daily  . sertraline  100 mg Oral Daily  . triamcinolone ointment   Topical Daily   Continuous Infusions: . sodium chloride 50 mL/hr at 07/02/16 1949        Time spent: 31 Minutes    Dana Allan, MD  [facility] Pager #: [phone] 7PM-7AM contact night coverage as above
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
## 2016-07-04 DIAGNOSIS — N179 Acute kidney failure, unspecified: Secondary | ICD-10-CM | POA: Diagnosis present

## 2016-07-04 LAB — BASIC METABOLIC PANEL
Anion gap: 9 (ref 5–15)
BUN: 41 mg/dL — ABNORMAL HIGH (ref 6–20)
CO2: 25 mmol/L (ref 22–32)
Calcium: 8.5 mg/dL — ABNORMAL LOW (ref 8.9–10.3)
Chloride: 98 mmol/L — ABNORMAL LOW (ref 101–111)
Creatinine, Ser: 2.39 mg/dL — ABNORMAL HIGH (ref 0.61–1.24)
GFR calc Af Amer: 33 mL/min — ABNORMAL LOW (ref >60)
GFR calc non Af Amer: 29 mL/min — ABNORMAL LOW (ref >60)
Glucose, Bld: 159 mg/dL — ABNORMAL HIGH (ref 65–99)
Potassium: 3.5 mmol/L (ref 3.5–5.1)
Sodium: 132 mmol/L — ABNORMAL LOW (ref 135–145)

## 2016-07-04 LAB — RAPID URINE DRUG SCREEN, HOSP PERFORMED
Amphetamines: NOT DETECTED
Barbiturates: NOT DETECTED
Benzodiazepines: NOT DETECTED
Cocaine: NOT DETECTED
Opiates: POSITIVE — AB
Tetrahydrocannabinol: NOT DETECTED

## 2016-07-04 LAB — GLUCOSE, CAPILLARY
Glucose-Capillary: 185 mg/dL — ABNORMAL HIGH (ref 65–99)
Glucose-Capillary: 85 mg/dL (ref 65–99)
Glucose-Capillary: 184 mg/dL — ABNORMAL HIGH (ref 65–99)
Glucose-Capillary: 288 mg/dL — ABNORMAL HIGH (ref 65–99)

## 2016-07-04 LAB — CBC
HCT: 40.4 % (ref 39.0–52.0)
Hemoglobin: 13.4 g/dL (ref 13.0–17.0)
MCH: 31.8 pg (ref 26.0–34.0)
MCHC: 33.2 g/dL (ref 30.0–36.0)
MCV: 96 fL (ref 78.0–100.0)
Platelets: 143 10*3/uL — ABNORMAL LOW (ref 150–400)
RBC: 4.21 MIL/uL — ABNORMAL LOW (ref 4.22–5.81)
RDW: 11.6 % (ref 11.5–15.5)
WBC: 5 10*3/uL (ref 4.0–10.5)

## 2016-07-04 LAB — C4 COMPLEMENT: Complement C4, Body Fluid: 23 mg/dL (ref 14–44)

## 2016-07-04 LAB — C3 COMPLEMENT: C3 Complement: 101 mg/dL (ref 82–167)

## 2016-07-04 MED ORDER — LABETALOL HCL 5 MG/ML IV SOLN
10.0000 mg | Freq: Four times a day (QID) | INTRAVENOUS | Status: DC | PRN
  Administered 2016-07-04 – 2016-07-06 (×2): 10 mg via INTRAVENOUS
  Filled 2016-07-04 (×2): qty 4

## 2016-07-04 MED ORDER — ENOXAPARIN SODIUM 40 MG/0.4ML ~~LOC~~ SOLN
40.0000 mg | SUBCUTANEOUS | Status: DC
  Administered 2016-07-04 – 2016-07-07 (×4): 40 mg via SUBCUTANEOUS
  Filled 2016-07-04 (×4): qty 0.4

## 2016-07-04 NOTE — Progress Notes (Signed)
Patient Name: Brad [name] Date of Encounter: 07/04/2016  Primary Cardiologist: Dr. Debara Pickett   Principal Problem:   Chest pain Active Problems:   Heroin addiction ([place])   History of drug abuse   GERD (gastroesophageal reflux disease)   Depression   Chronic combined systolic and diastolic heart failure, NYHA class 1 (HCC)   Polysubstance abuse   Chronic hepatitis C with cirrhosis (HCC)   Elevated troponin   Benign essential HTN   Uncontrolled diabetes mellitus with diabetic nephropathy, with long-term current use of insulin (HCC)   Hypokalemia   CKD (chronic kidney disease) stage 3, GFR 30-59 ml/min   Painful diabetic neuropathy (HCC)   Cocaine abuse    SUBJECTIVE  Has constant R shoulder pain radiating down to R arm for several days, worse with lifting. Denies any symptom with walking. Also since myoview, he has been complaining of epigastric pain tender on palpation.   CURRENT MEDS . amitriptyline  75 mg Oral QHS  . aspirin  81 mg Oral Daily  . atorvastatin  10 mg Oral Daily  . cloNIDine  0.1 mg Oral BID  . enoxaparin (LOVENOX) injection  30 mg Subcutaneous Q24H  . gabapentin  300 mg Oral TID  . hydrALAZINE  25 mg Oral TID  . insulin aspart  0-5 Units Subcutaneous QHS  . insulin aspart  0-9 Units Subcutaneous TID WC  . insulin glargine  40 Units Subcutaneous BID  . isosorbide mononitrate  30 mg Oral Daily  . pantoprazole  40 mg Oral Daily  . sertraline  100 mg Oral Daily  . triamcinolone ointment   Topical Daily    OBJECTIVE  Filed Vitals:   07/03/16 0955 07/03/16 1144 07/03/16 2046 07/04/16 0551  BP: 136/100 125/81 136/88 129/90  Pulse:  79 72 64  Temp:  97.6 F (36.4 C) 97.6 F (36.4 C) 98 F (36.7 C)  TempSrc:  Oral Oral Oral  Resp:  16 15 17   Height:      Weight:    209 lb 9.6 oz (95.074 kg)  SpO2:  100% 98% 100%    Intake/Output Summary (Last 24 hours) at 07/04/16 0824 Last data filed at 07/04/16 0600  Gross per 24 hour  Intake   1370 ml    Output    775 ml  Net    595 ml   Filed Weights   07/02/16 0531 07/03/16 0522 07/04/16 0551  Weight: 200 lb 9.6 oz (90.992 kg) 205 lb 14.4 oz (93.396 kg) 209 lb 9.6 oz (95.074 kg)    PHYSICAL EXAM  General: Pleasant, NAD. Neuro: Alert and oriented X 3. Moves all extremities spontaneously. Psych: Normal affect. HEENT:  Normal  Neck: Supple without bruits or JVD. Lungs:  Resp regular and unlabored, CTA. Heart: RRR no s3, s4, or murmurs. Abdomen: Soft, non-tender, BS + x 4.  +tenderness in epigastric region. Extremities: No clubbing, cyanosis or edema. DP/PT/Radials 2+ and equal bilaterally. R shoulder tenderness with pressure   Accessory Clinical Findings  CBC  Recent Labs  07/01/16 1838 07/04/16 0151  WBC 7.5 5.0  HGB 15.7 13.4  HCT 45.0 40.4  MCV 94.9 96.0  PLT 171 [id number]*   Basic Metabolic Panel  Recent Labs  07/02/16 1019 07/03/16 0459 07/04/16 0151  NA 131* 132* 132*  K 3.8 3.9 3.5  CL 96* 99* 98*  CO2 26 23 25   GLUCOSE 258* 116* 159*  BUN 31* 42* 41*  CREATININE 2.42* 2.65* 2.39*  CALCIUM 8.6* 8.4* 8.5*  MG 1.7  --   --  PHOS 3.1  --   --    Liver Function Tests  Recent Labs  07/02/16 1019  ALBUMIN 3.4*   Cardiac Enzymes  Recent Labs  07/01/16 1838 07/02/16 0102 07/02/16 0555  TROPONINI 0.11* 0.08* 0.09*   Hemoglobin A1C  Recent Labs  07/01/16 1838  HGBA1C 9.5*    TELE NSR without significant ventricular ectopy    ECG  No new EKG  Echocardiogram 05/27/2016  LV EF: 50% - 55%  ------------------------------------------------------------------- Indications: Syncope 780.2.  ------------------------------------------------------------------- History: PMH: Polysubstance Abuse, NICM. Risk factors: Current tobacco use. Hypertension. Diabetes mellitus.  ------------------------------------------------------------------- Study Conclusions  - Left ventricle: The cavity size was normal. Wall thickness was   increased in a pattern of severe LVH. Systolic function was  normal. The estimated ejection fraction was in the range of 50%  to 55%. Wall motion was normal; there were no regional wall  motion abnormalities. Doppler parameters are consistent with  abnormal left ventricular relaxation (grade 1 diastolic  dysfunction). - Mitral valve: There was mild regurgitation. - Left atrium: The atrium was mildly dilated.    Radiology/Studies  Ct Abdomen Pelvis Wo Contrast  06/26/2016  CLINICAL DATA:  Initial evaluation for acute nausea a, right upper quadrant pain. EXAM: CT ABDOMEN AND PELVIS WITHOUT CONTRAST TECHNIQUE: Multidetector CT imaging of the abdomen and pelvis was performed following the standard protocol without IV contrast. COMPARISON:  Prior ultrasound from earlier the same day. FINDINGS: Minimal subsegmental atelectasis seen dependently within the visualized lung bases. Visualized lungs are otherwise clear. Coronary artery calcifications are partially visualized. Trace pericardial fluid noted. No pleural effusion. Liver demonstrates a nodular contour, compatible with cirrhosis. Evaluation for focal intrahepatic mass limited on this noncontrast examination. Possible gastrohepatic varices noted. Gallbladder within normal limits. No biliary dilatation. Spleen within normal limits for size and appearance. Adrenal glands and pancreas demonstrate a normal unenhanced appearance. Kidneys are equal in size without evidence of nephrolithiasis or hydronephrosis. Subcentimeter hypodense lesion within the left kidney too small the characterize on this exam, but may reflect a small cyst. No radiopaque calculi seen along the course of either renal collecting system. There is no hydroureter. Stomach within normal limits. No evidence for bowel obstruction. No abnormal wall thickening or inflammatory fat stranding seen about the bowels. Appendix well visualized in the right lower quadrant and is of normal caliber  and appearance associated inflammatory changes to suggest acute appendicitis. Mild circumferential bladder wall thickening, suspected to be related incomplete distension and/or chronic outlet obstruction. Prostate is enlarged with median lobe hypertrophy. Prostate measures 5.7 cm in transverse diameter. No free air or fluid. No ascites. Few mildly prominent porta hepatis nodes measure up to 9 mm, likely related underlying intrinsic liver disease. No pathologically enlarged intra-abdominal or pelvic lymph nodes identified. Scattered aorto bi-iliac atherosclerotic calcifications, moderate in nature. Aorta measures up to 3.1 cm in greatest diameter just above the takeoff of the celiac axis. Scattered soft tissue density within the subcutaneous fat of the anterior abdomen, likely related to injection sites. No acute osseous abnormality. No worrisome lytic or blastic osseous lesions. IMPRESSION: 1. No acute intra-abdominal or pelvic process identified. 2. Nodular contour of the liver, consistent with cirrhosis. 3. Enlarged prostate. Mild circumferential bladder wall thickening suspected to be related to chronic outlet obstruction. 4. Coronary artery calcifications with moderate aorto bi-iliac atherosclerotic disease. Intra-abdominal aorta measures up to 3.1 cm in diameter. Recommend followup by ultrasound in 3 years. This recommendation follows ACR consensus guidelines: White Paper of the ACR Incidental Findings Committee  II on Vascular Findings. [facility] Radiol 2013; 10:789-794 Electronically Signed   By: Jeannine Boga M.D.   On: 06/26/2016 06:25   Dg Chest 2 View  07/01/2016  CLINICAL DATA:  Sweats and left upper extremity pain EXAM: CHEST  2 VIEW COMPARISON:  June 26, 2016 FINDINGS: There is slight scarring in the left lower lung zone. Lungs elsewhere are clear. Heart size and pulmonary vascularity are normal. No adenopathy. There is calcification in the aortic arch region. There is degenerative change in  the lower thoracic spine region. IMPRESSION: Mild scarring left lower lung zone. No edema or consolidation. Aortic atherosclerosis. Electronically Signed   By: Lowella Grip III M.D.   On: 07/01/2016 13:23   US Renal  07/03/2016  CLINICAL DATA:  Elevated creatinine, acute kidney injury. EXAM: RENAL / URINARY TRACT ULTRASOUND COMPLETE COMPARISON:  06/26/2016 FINDINGS: Right Kidney: Length: 11.1 cm. Echogenicity within normal limits. No mass or hydronephrosis visualized. Left Kidney: Length: 11.1 cm. Echogenicity within normal limits. No mass or hydronephrosis visualized. 1.2 by 1.2 by 1.0 cm left mid kidney simple appearing cyst. 0.8 by 0.8 by 0.6 cm left kidney lower pole hypoechoic lesion compatible with cyst. Bladder: Appears normal for degree of bladder distention. IMPRESSION: 1. Several small left renal cyst but otherwise normal sonographic appearance of the kidneys in urinary bladder. Electronically Signed   By: Van Clines M.D.   On: 07/03/2016 14:43   Nm Myocar Multi W/spect W/wall Motion / Ef  07/02/2016   Defect 1: There is a large defect of moderate severity present in the basal inferior, basal inferolateral, basal anterolateral, mid inferior, mid inferolateral and mid anterolateral location.  Findings consistent with ischemia.  This is an intermediate risk study.  Nuclear stress EF: 35%. There is anteroseptal/apical hypokinesis  Abnormal study. Relayed findings to [employer]. Candee Furbish, MD   Dg Abd Acute W/chest  06/26/2016  CLINICAL DATA:  Acute onset of generalized abdominal pain and nausea. Initial encounter. EXAM: DG ABDOMEN ACUTE W/ 1V CHEST COMPARISON:  Chest radiograph and CTA of the chest performed 05/27/2016, and abdominal ultrasound performed 04/26/2012 FINDINGS: The lungs are well-aerated. Pulmonary vascularity is at the upper limits of normal. There is no evidence of focal opacification, pleural effusion or pneumothorax. The cardiomediastinal silhouette is within  normal limits. The visualized bowel gas pattern is unremarkable. Scattered stool and air are seen within the colon; there is no evidence of small bowel dilatation to suggest obstruction. No free intra-abdominal air is identified on the provided upright view. No acute osseous abnormalities are seen; the sacroiliac joints are unremarkable in appearance. IMPRESSION: 1. Unremarkable bowel gas pattern; no free intra-abdominal air seen. Small amount of stool noted in the colon. 2. No acute cardiopulmonary process seen. Electronically Signed   By: Garald Balding M.D.   On: 06/26/2016 04:04   US Abdomen Limited Ruq  06/26/2016  CLINICAL DATA:  Acute onset of right upper quadrant abdominal pain. Initial encounter. EXAM: US ABDOMEN LIMITED - RIGHT UPPER QUADRANT COMPARISON:  Abdominal ultrasound performed 04/26/2012 FINDINGS: Gallbladder: No gallstones or wall thickening visualized. No sonographic Murphy sign noted by sonographer. Common bile duct: Diameter: 0.3 cm, within normal limits in caliber. Liver: No focal lesion identified. The mildly nodular contour of the liver raises question for mild hepatic cirrhosis. Parenchymal echogenicity is mildly heterogeneous. IMPRESSION: 1. No acute abnormality seen at the right upper quadrant. 2. Mildly nodular contour of the liver raises question for mild hepatic cirrhosis. Would correlate with LFTs. Electronically Signed  By: Garald Balding M.D.   On: 06/26/2016 05:01    ASSESSMENT AND PLAN  Yotam Jointer is a 55 y.o. year old male with a history of HTN, DM, NICM (EF of 50-55% on echo 05/27/16), Hepatitis C, orthostatic hypotension, chronic diastolic dysfunction, tobacco abuse and prior drug abuse presented with epigastric pain. Nonobstructive CAD on cath 09/2015. UDS positive for cocaine. Hgb A1C 9.5.   1. Persistent chest pain  - Echo 05/27/2016 EF 50-55%, severe LVH, G1DD, mild MR.   - myoview large defect of moderate severity present in basal inferior, basal  inferolateral, basal anterolateral, mid inferior, mid inferolateral and mid anterolateral location, consistent with ischemia, EF 35%  - cath delayed due to worsening renal function. Nephrology consulted on 7/13  - once renal function approaches baseline per nephrology, will attempt cardiac catheterization. His symptom is somewhat atypical, R shoulder constant pain worse with lifting arm and pressure, epigastric pain since myoview worse with palpation, but stress test is very much abnormal with decreased EF.  2. Cocaine abuse: positive UDT  3. HTN: Imdur was added  4. Acute on chronic renal insufficiency: evaluated by Dr. Joelyn Oms [facility], felt to be related to chronic HTN and DM2. Recommended hydration. Renal U/S 7/13 several small left renal cyst, but otherwise normal kidney.    Signed, Almyra Deforest PA-C Pager: [phone]

## 2016-07-04 NOTE — Progress Notes (Signed)
Patient ID: Brad [name], male   DOB: 1961-09-11, 55 y.o.   MRN: [id number] S:still complaining of chest pressure O:BP 129/90 mmHg  Pulse 64  Temp(Src) 98 F (36.7 C) (Oral)  Resp 17  Ht 5\' 11"  (1.803 m)  Wt 95.074 kg (209 lb 9.6 oz)  BMI 29.25 kg/m2  SpO2 100%  Intake/Output Summary (Last 24 hours) at 07/04/16 0901 Last data filed at 07/04/16 0856  Gross per 24 hour  Intake   1370 ml  Output    375 ml  Net    995 ml   Intake/Output: I/O last 3 completed shifts: In: 2119.2 [P.O.:960; I.V.:1159.2] Out: 1075 [Urine:1075]  Intake/Output this shift:    Weight change: 1.678 kg (3 lb 11.2 oz) Gen:WD WN AAM in NAd CVS:no rub Resp:cta [id number] Ext:no edema   Recent Labs Lab 06/28/16 0409 07/01/16 1216 07/01/16 1838 07/02/16 1019 07/03/16 0459 07/04/16 0151  NA 133* 132*  --  131* 132* 132*  K 4.4 3.1*  --  3.8 3.9 3.5  CL 98* 92*  --  96* 99* 98*  CO2 31 27  --  26 23 25   GLUCOSE 264* 239*  --  258* 116* 159*  BUN 23* 20  --  31* 42* 41*  CREATININE 1.99* 2.14* 2.32* 2.42* 2.65* 2.39*  ALBUMIN  --   --   --  3.4*  --   --   CALCIUM 9.2 8.9  --  8.6* 8.4* 8.5*  PHOS  --   --   --  3.1  --   --    Liver Function Tests:  Recent Labs Lab 07/02/16 1019  ALBUMIN 3.4*   No results for input(s): LIPASE, AMYLASE in the last 168 hours. No results for input(s): AMMONIA in the last 168 hours. CBC:  Recent Labs Lab 06/28/16 0409 07/01/16 1216 07/01/16 1838 07/04/16 0151  WBC 4.0 8.7 7.5 5.0  HGB 14.1 16.4 15.7 13.4  HCT 41.9 46.1 45.0 40.4  MCV 96.5 94.3 94.9 96.0  PLT 128* 181 171 143*   Cardiac Enzymes:  Recent Labs Lab 07/01/16 1838 07/02/16 0102 07/02/16 0555  TROPONINI 0.11* 0.08* 0.09*   CBG:  Recent Labs Lab 07/03/16 1631 07/03/16 2041 07/03/16 2043 07/03/16 2123 07/04/16 0609  GLUCAP 315* 430* 177* 218* 185*    Iron Studies: No results for input(s): IRON, TIBC, TRANSFERRIN, FERRITIN in the last 72 hours. Studies/Results: US  Renal  07/03/2016  CLINICAL DATA:  Elevated creatinine, acute kidney injury. EXAM: RENAL / URINARY TRACT ULTRASOUND COMPLETE COMPARISON:  06/26/2016 FINDINGS: Right Kidney: Length: 11.1 cm. Echogenicity within normal limits. No mass or hydronephrosis visualized. Left Kidney: Length: 11.1 cm. Echogenicity within normal limits. No mass or hydronephrosis visualized. 1.2 by 1.2 by 1.0 cm left mid kidney simple appearing cyst. 0.8 by 0.8 by 0.6 cm left kidney lower pole hypoechoic lesion compatible with cyst. Bladder: Appears normal for degree of bladder distention. IMPRESSION: 1. Several small left renal cyst but otherwise normal sonographic appearance of the kidneys in urinary bladder. Electronically Signed   By: Van Clines M.D.   On: 07/03/2016 14:43   Nm Myocar Multi W/spect W/wall Motion / Ef  07/02/2016   Defect 1: There is a large defect of moderate severity present in the basal inferior, basal inferolateral, basal anterolateral, mid inferior, mid inferolateral and mid anterolateral location.  Findings consistent with ischemia.  This is an intermediate risk study.  Nuclear stress EF: 35%. There is anteroseptal/apical hypokinesis  Abnormal study. Relayed findings to [facility]  Waneta Martins, MD   . amitriptyline  75 mg Oral QHS  . aspirin  81 mg Oral Daily  . atorvastatin  10 mg Oral Daily  . cloNIDine  0.1 mg Oral BID  . enoxaparin (LOVENOX) injection  30 mg Subcutaneous Q24H  . gabapentin  300 mg Oral TID  . hydrALAZINE  25 mg Oral TID  . insulin aspart  0-5 Units Subcutaneous QHS  . insulin aspart  0-9 Units Subcutaneous TID WC  . insulin glargine  40 Units Subcutaneous BID  . isosorbide mononitrate  30 mg Oral Daily  . pantoprazole  40 mg Oral Daily  . sertraline  100 mg Oral Daily  . triamcinolone ointment   Topical Daily    BMET    Component Value Date/Time   NA 132* 07/04/2016 0151   K 3.5 07/04/2016 0151   CL 98* 07/04/2016 0151   CO2 25 07/04/2016 0151   GLUCOSE 159*  07/04/2016 0151   BUN 41* 07/04/2016 0151   CREATININE 2.39* 07/04/2016 0151   CREATININE 1.75* 01/04/2016 0929   CALCIUM 8.5* 07/04/2016 0151   GFRNONAA 29* 07/04/2016 0151   GFRNONAA 55* 11/21/2015 1249   GFRAA 33* 07/04/2016 0151   GFRAA 64 11/21/2015 1249   CBC    Component Value Date/Time   WBC 5.0 07/04/2016 0151   RBC 4.21* 07/04/2016 0151   HGB 13.4 07/04/2016 0151   HCT 40.4 07/04/2016 0151   PLT 143* 07/04/2016 0151   MCV 96.0 07/04/2016 0151   MCH 31.8 07/04/2016 0151   MCHC 33.2 07/04/2016 0151   RDW 11.6 07/04/2016 0151   LYMPHSABS 2.1 06/26/2016 0336   MONOABS 0.7 06/26/2016 0336   EOSABS 0.1 06/26/2016 0336   BASOSABS 0.0 06/26/2016 0336   71M with AoCKD, chest pain with depressed LEVF and RMWA, HTN, DM2, chronic HCV, rash on legs  Assessment/Plan:  1. AKI/CKD stage 3- in setting of systolic CHF and NSAIDs 1. Normal complements and only minimal proteinuria, no hematuria or pyuria 2. Scr slowly improving cont with gentle hydration 2. CKD stage 3 presumably due to DM and HTN +/- cocaine 3. SSCP and systolic CHF- awaiting improvement of Scr prior to left heart catheterization 4. Hep C- per primary svc 5. HTN- need better control, per cardiology 6. History of polysubstance abuse- positive for cocaine and possible cause of SSCP 7. DM- per primary svc 8. Rash on legs- Derm to evaluate, doubt HSP since he has no hematuria.  [place]

## 2016-07-04 NOTE — Progress Notes (Signed)
PROGRESS NOTE    Brad [name]  [id number] DOB: August 09, 1961 DOA: 07/01/2016 PCP: Lance Bosch, NP  Outpatient Specialists:   Brief Narrative: Brad [name] is a 55 y.o. male with past medical history significant for hypertension, dyslipidemia, insulin-dependent diabetes, nonischemic cardiomyopathy with ejection fraction of 50% on 2-D echo in June 0000000, chronic diastolic dysfunction, prior drug abuse including heroin addiction. He was just hospitalized from 06/26/2016 through 06/28/2016 with elevated troponin level at which time he did not have ischemic changes on 12-lead EKG and cardiology elected no further ischemic workup. Patient had previous hospitalization back in June 2017 with syncope and orthostatic hypotension. His urine drug screen was positive for cocaine at that time. His medications were adjusted and he was subsequently discharged. Patient now presents with ongoing chest pain in the left sternal area. He reports his pain is radiating to the right side, neck and down the right arm. Pain has been present since his last discharge 06/28/2016.   This AM not with too many symptoms, some chest pain and some epigastric discomfort. Denies dizziness or lightheadedness while lying in bed.   Assessment & Plan:   Principal Problem:   Chest pain Active Problems:   Heroin addiction (HCC)   History of drug abuse   GERD (gastroesophageal reflux disease)   Depression   Chronic combined systolic and diastolic heart failure, NYHA class 1 (HCC)   Polysubstance abuse   Chronic hepatitis C with cirrhosis (HCC)   Elevated troponin   Benign essential HTN   Uncontrolled diabetes mellitus with diabetic nephropathy, with long-term current use of insulin (HCC)   Hypokalemia   CKD (chronic kidney disease) stage 3, GFR 30-59 ml/min   Painful diabetic neuropathy (HCC)   Cocaine abuse  Chest pain - Cardiac stress test is abnormal. Plan was in place for cardiac cath, but holding off for now due  to AKI. Nephrology has been consulted, appreciate their assistance.  Likely Cocaine use - patient counseled.  GERD (gastroesophageal reflux disease) - Continue Protonix   Depression - Continue Elavil and Zoloft   Chronic combined systolic and diastolic heart failure, NYHA class 1 (HCC) - Compensated - 2-D echo in June 2017 with ejection fraction 50-55%    Benign essential HTN - Continue carvedilol, clonidine, hydralazine   Uncontrolled diabetes mellitus with diabetic nephropathy, with long-term current use of insulin (HCC) - A1c 9.5 - Resume insulin per home regimen - Add SSI   Hypokalemia - Supplemented   acute kidney injury on CKD (chronic kidney disease) stage 3, GFR 30-59 ml/min - Baseline Cr 2.45 in 10/2015 - Cr on this admission 2.14, within baseline range but has worsened - Renal following renal US unremarkable, cont IVF.   Painful diabetic neuropathy (HCC) - Resume Cogentin  DVT prophylaxis: Lovenox subQ Code Status: Full code Family Communication: no family at the bedside  Disposition Plan: admission observation for chest pain evaluation  Consults called: cardiology, Dr. Daneen Schick, nephrology  Admission status: observation   Consultants:   Cardiology  Nephrology  Procedures:   Cardiac stress test  Antimicrobials:   None    Objective: Filed Vitals:   07/03/16 0955 07/03/16 1144 07/03/16 2046 07/04/16 0551  BP: 136/100 125/81 136/88 129/90  Pulse:  79 72 64  Temp:  97.6 F (36.4 C) 97.6 F (36.4 C) 98 F (36.7 C)  TempSrc:  Oral Oral Oral  Resp:  16 15 17   Height:      Weight:    95.074 kg (209 lb 9.6 oz)  SpO2:  100% 98% 100%    Intake/Output Summary (Last 24 hours) at 07/04/16 1113 Last data filed at 07/04/16 0959  Gross per 24 hour  Intake   1545 ml  Output    375 ml  Net   1170 ml   Filed Weights   07/02/16 0531 07/03/16 0522 07/04/16 0551  Weight: 90.992 kg (200 lb 9.6 oz) 93.396 kg (205 lb 14.4 oz) 95.074 kg  (209 lb 9.6 oz)    Examination:  General exam: Appears calm and comfortable  Respiratory system: Clear to auscultation. Respiratory effort normal. Cardiovascular system: S1 & S2 heard, RRR. No JVD, murmurs, rubs Gastrointestinal system: Abdomen is nondistended, soft and nontender.   Central nervous system: Alert and oriented. No focal neurological deficits. Extremities: Symmetric 5 x 5 power. No edema.   Data Reviewed: I have personally reviewed following labs and imaging studies  CBC:  Recent Labs Lab 06/28/16 0409 07/01/16 1216 07/01/16 1838 07/04/16 0151  WBC 4.0 8.7 7.5 5.0  HGB 14.1 16.4 15.7 13.4  HCT 41.9 46.1 45.0 40.4  MCV 96.5 94.3 94.9 96.0  PLT 128* 181 171 [id number]*   Basic Metabolic Panel:  Recent Labs Lab 06/28/16 0409 07/01/16 1216 07/01/16 1838 07/02/16 1019 07/03/16 0459 07/04/16 0151  NA 133* 132*  --  131* 132* 132*  K 4.4 3.1*  --  3.8 3.9 3.5  CL 98* 92*  --  96* 99* 98*  CO2 31 27  --  26 23 25   GLUCOSE 264* 239*  --  258* 116* 159*  BUN 23* 20  --  31* 42* 41*  CREATININE 1.99* 2.14* 2.32* 2.42* 2.65* 2.39*  CALCIUM 9.2 8.9  --  8.6* 8.4* 8.5*  MG  --   --   --  1.7  --   --   PHOS  --   --   --  3.1  --   --    GFR: Estimated Creatinine Clearance: 40.6 mL/min (by C-G formula based on Cr of 2.39). Liver Function Tests:  Recent Labs Lab 07/02/16 1019  ALBUMIN 3.4*   No results for input(s): LIPASE, AMYLASE in the last 168 hours. No results for input(s): AMMONIA in the last 168 hours. Coagulation Profile:  Recent Labs Lab 07/03/16 0459  INR 1.15   Cardiac Enzymes:  Recent Labs Lab 07/01/16 1838 07/02/16 0102 07/02/16 0555  TROPONINI 0.11* 0.08* 0.09*   BNP (last 3 results) No results for input(s): PROBNP in the last 8760 hours. HbA1C:  Recent Labs  07/01/16 1838  HGBA1C 9.5*   CBG:  Recent Labs Lab 07/03/16 1631 07/03/16 2041 07/03/16 2043 07/03/16 2123 07/04/16 0609  GLUCAP 315* 430* 177* 218* 185*    Lipid Profile: No results for input(s): CHOL, HDL, LDLCALC, TRIG, CHOLHDL, LDLDIRECT in the last 72 hours. Thyroid Function Tests: No results for input(s): TSH, T4TOTAL, FREET4, T3FREE, THYROIDAB in the last 72 hours. Anemia Panel: No results for input(s): VITAMINB12, FOLATE, FERRITIN, TIBC, IRON, RETICCTPCT in the last 72 hours. Urine analysis:    Component Value Date/Time   COLORURINE AMBER* 07/03/2016 2125   APPEARANCEUR CLEAR 07/03/2016 2125   LABSPEC 1.022 07/03/2016 2125   PHURINE 5.5 07/03/2016 2125   GLUCOSEU NEGATIVE 07/03/2016 2125   HGBUR NEGATIVE 07/03/2016 2125   BILIRUBINUR NEGATIVE 07/03/2016 2125   KETONESUR NEGATIVE 07/03/2016 2125   PROTEINUR 30* 07/03/2016 2125   UROBILINOGEN 1.0 04/26/2012 1328   NITRITE NEGATIVE 07/03/2016 2125   LEUKOCYTESUR NEGATIVE 07/03/2016 2125   Sepsis Labs: @LABRCNTIP (procalcitonin:4,lacticidven:4)  )  No results found for this or any previous visit (from the past 240 hour(s)).       Radiology Studies: US Renal  07/03/2016  CLINICAL DATA:  Elevated creatinine, acute kidney injury. EXAM: RENAL / URINARY TRACT ULTRASOUND COMPLETE COMPARISON:  06/26/2016 FINDINGS: Right Kidney: Length: 11.1 cm. Echogenicity within normal limits. No mass or hydronephrosis visualized. Left Kidney: Length: 11.1 cm. Echogenicity within normal limits. No mass or hydronephrosis visualized. 1.2 by 1.2 by 1.0 cm left mid kidney simple appearing cyst. 0.8 by 0.8 by 0.6 cm left kidney lower pole hypoechoic lesion compatible with cyst. Bladder: Appears normal for degree of bladder distention. IMPRESSION: 1. Several small left renal cyst but otherwise normal sonographic appearance of the kidneys in urinary bladder. Electronically Signed   By: Van Clines M.D.   On: 07/03/2016 14:43        Scheduled Meds: . amitriptyline  75 mg Oral QHS  . aspirin  81 mg Oral Daily  . atorvastatin  10 mg Oral Daily  . cloNIDine  0.1 mg Oral BID  . enoxaparin  (LOVENOX) injection  30 mg Subcutaneous Q24H  . gabapentin  300 mg Oral TID  . hydrALAZINE  25 mg Oral TID  . insulin aspart  0-5 Units Subcutaneous QHS  . insulin aspart  0-9 Units Subcutaneous TID WC  . insulin glargine  40 Units Subcutaneous BID  . isosorbide mononitrate  30 mg Oral Daily  . pantoprazole  40 mg Oral Daily  . sertraline  100 mg Oral Daily  . triamcinolone ointment   Topical Daily   Continuous Infusions: . sodium chloride 50 mL/hr at 07/03/16 2135     LOS: 1 day    Time spent: 4 Minutes    Dana Allan, MD  [facility] Pager #: [phone] 7PM-7AM contact night coverage as above

## 2016-07-05 DIAGNOSIS — E1165 Type 2 diabetes mellitus with hyperglycemia: Secondary | ICD-10-CM

## 2016-07-05 DIAGNOSIS — D649 Anemia, unspecified: Secondary | ICD-10-CM | POA: Diagnosis present

## 2016-07-05 DIAGNOSIS — E876 Hypokalemia: Secondary | ICD-10-CM

## 2016-07-05 DIAGNOSIS — R072 Precordial pain: Secondary | ICD-10-CM

## 2016-07-05 DIAGNOSIS — E1121 Type 2 diabetes mellitus with diabetic nephropathy: Secondary | ICD-10-CM

## 2016-07-05 DIAGNOSIS — Z794 Long term (current) use of insulin: Secondary | ICD-10-CM

## 2016-07-05 DIAGNOSIS — D696 Thrombocytopenia, unspecified: Secondary | ICD-10-CM

## 2016-07-05 LAB — RENAL FUNCTION PANEL
Albumin: 3.1 g/dL — ABNORMAL LOW (ref 3.5–5.0)
Anion gap: 12 (ref 5–15)
BUN: 30 mg/dL — ABNORMAL HIGH (ref 6–20)
CO2: 23 mmol/L (ref 22–32)
Calcium: 8.9 mg/dL (ref 8.9–10.3)
Chloride: 102 mmol/L (ref 101–111)
Creatinine, Ser: 1.84 mg/dL — ABNORMAL HIGH (ref 0.61–1.24)
GFR calc Af Amer: 46 mL/min — ABNORMAL LOW (ref >60)
GFR calc non Af Amer: 39 mL/min — ABNORMAL LOW (ref >60)
Glucose, Bld: 168 mg/dL — ABNORMAL HIGH (ref 65–99)
Phosphorus: 2.3 mg/dL — ABNORMAL LOW (ref 2.5–4.6)
Potassium: 4.1 mmol/L (ref 3.5–5.1)
Sodium: 137 mmol/L (ref 135–145)

## 2016-07-05 LAB — GLUCOSE, CAPILLARY
Glucose-Capillary: 232 mg/dL — ABNORMAL HIGH (ref 65–99)
Glucose-Capillary: 300 mg/dL — ABNORMAL HIGH (ref 65–99)
Glucose-Capillary: 146 mg/dL — ABNORMAL HIGH (ref 65–99)

## 2016-07-05 LAB — CBC
HCT: 36.6 % — ABNORMAL LOW (ref 39.0–52.0)
Hemoglobin: 12.5 g/dL — ABNORMAL LOW (ref 13.0–17.0)
MCH: 32.8 pg (ref 26.0–34.0)
MCHC: 34.2 g/dL (ref 30.0–36.0)
MCV: 96.1 fL (ref 78.0–100.0)
Platelets: 134 10*3/uL — ABNORMAL LOW (ref 150–400)
RBC: 3.81 MIL/uL — ABNORMAL LOW (ref 4.22–5.81)
RDW: 11.8 % (ref 11.5–15.5)
WBC: 5 10*3/uL (ref 4.0–10.5)

## 2016-07-05 NOTE — Progress Notes (Signed)
Patient ID: Brad [name], male   DOB: Aug 04, 1961, 55 y.o.   MRN: [id number] S:feels better no cp O:BP 167/94 mmHg  Pulse 85  Temp(Src) 98.6 F (37 C) (Oral)  Resp 20  Ht 5\' 11"  (1.803 m)  Wt 95.119 kg (209 lb 11.2 oz)  BMI 29.26 kg/m2  SpO2 97%  Intake/Output Summary (Last 24 hours) at 07/05/16 0839 Last data filed at 07/05/16 0645  Gross per 24 hour  Intake   1055 ml  Output    420 ml  Net    635 ml   Intake/Output: I/O last 3 completed shifts: In: 2135 [P.O.:1115; I.V.:1020] Out: 545 [Urine:545]  Intake/Output this shift:    Weight change: 0.045 kg (1.6 oz) Gen:wd wn aam in nad CVS:no rub Resp:cta [id number] Ext:no edema, rash: multiple plaques with desquamation centrally   Recent Labs Lab 07/01/16 1216 07/01/16 1838 07/02/16 1019 07/03/16 0459 07/04/16 0151 07/05/16 0321  NA 132*  --  131* 132* 132* 137  K 3.1*  --  3.8 3.9 3.5 4.1  CL 92*  --  96* 99* 98* 102  CO2 27  --  26 23 25 23   GLUCOSE 239*  --  258* 116* 159* 168*  BUN 20  --  31* 42* 41* 30*  CREATININE 2.14* 2.32* 2.42* 2.65* 2.39* 1.84*  ALBUMIN  --   --  3.4*  --   --  3.1*  CALCIUM 8.9  --  8.6* 8.4* 8.5* 8.9  PHOS  --   --  3.1  --   --  2.3*   Liver Function Tests:  Recent Labs Lab 07/02/16 1019 07/05/16 0321  ALBUMIN 3.4* 3.1*   No results for input(s): LIPASE, AMYLASE in the last 168 hours. No results for input(s): AMMONIA in the last 168 hours. CBC:  Recent Labs Lab 07/01/16 1216 07/01/16 1838 07/04/16 0151 07/05/16 0321  WBC 8.7 7.5 5.0 5.0  HGB 16.4 15.7 13.4 12.5*  HCT 46.1 45.0 40.4 36.6*  MCV 94.3 94.9 96.0 96.1  PLT 181 171 143* 134*   Cardiac Enzymes:  Recent Labs Lab 07/01/16 1838 07/02/16 0102 07/02/16 0555  TROPONINI 0.11* 0.08* 0.09*   CBG:  Recent Labs Lab 07/04/16 0609 07/04/16 1145 07/04/16 1651 07/04/16 2125 07/05/16 0550  GLUCAP 185* 85 184* 288* 232*    Iron Studies: No results for input(s): IRON, TIBC, TRANSFERRIN, FERRITIN in  the last 72 hours. Studies/Results: US Renal  07/03/2016  CLINICAL DATA:  Elevated creatinine, acute kidney injury. EXAM: RENAL / URINARY TRACT ULTRASOUND COMPLETE COMPARISON:  06/26/2016 FINDINGS: Right Kidney: Length: 11.1 cm. Echogenicity within normal limits. No mass or hydronephrosis visualized. Left Kidney: Length: 11.1 cm. Echogenicity within normal limits. No mass or hydronephrosis visualized. 1.2 by 1.2 by 1.0 cm left mid kidney simple appearing cyst. 0.8 by 0.8 by 0.6 cm left kidney lower pole hypoechoic lesion compatible with cyst. Bladder: Appears normal for degree of bladder distention. IMPRESSION: 1. Several small left renal cyst but otherwise normal sonographic appearance of the kidneys in urinary bladder. Electronically Signed   By: Van Clines M.D.   On: 07/03/2016 14:43   . amitriptyline  75 mg Oral QHS  . aspirin  81 mg Oral Daily  . atorvastatin  10 mg Oral Daily  . cloNIDine  0.1 mg Oral BID  . enoxaparin (LOVENOX) injection  40 mg Subcutaneous Q24H  . gabapentin  300 mg Oral TID  . hydrALAZINE  25 mg Oral TID  . insulin aspart  0-5 Units Subcutaneous  QHS  . insulin aspart  0-9 Units Subcutaneous TID WC  . insulin glargine  40 Units Subcutaneous BID  . isosorbide mononitrate  30 mg Oral Daily  . pantoprazole  40 mg Oral Daily  . sertraline  100 mg Oral Daily  . triamcinolone ointment   Topical Daily    BMET    Component Value Date/Time   NA 137 07/05/2016 0321   K 4.1 07/05/2016 0321   CL 102 07/05/2016 0321   CO2 23 07/05/2016 0321   GLUCOSE 168* 07/05/2016 0321   BUN 30* 07/05/2016 0321   CREATININE 1.84* 07/05/2016 0321   CREATININE 1.75* 01/04/2016 0929   CALCIUM 8.9 07/05/2016 0321   GFRNONAA 39* 07/05/2016 0321   GFRNONAA 55* 11/21/2015 1249   GFRAA 46* 07/05/2016 0321   GFRAA 64 11/21/2015 1249   CBC    Component Value Date/Time   WBC 5.0 07/05/2016 0321   RBC 3.81* 07/05/2016 0321   HGB 12.5* 07/05/2016 0321   HCT 36.6* 07/05/2016 0321    PLT 134* 07/05/2016 0321   MCV 96.1 07/05/2016 0321   MCH 32.8 07/05/2016 0321   MCHC 34.2 07/05/2016 0321   RDW 11.8 07/05/2016 0321   LYMPHSABS 2.1 06/26/2016 0336   MONOABS 0.7 06/26/2016 0336   EOSABS 0.1 06/26/2016 0336   BASOSABS 0.0 06/26/2016 0336     75M with AoCKD, chest pain with depressed LEVF and RMWA, HTN, DM2, chronic HCV, rash on legs  Assessment/Plan:  1. AKI/CKD stage 3- in setting of systolic CHF and NSAIDs 1. Normal complements and only minimal proteinuria, no hematuria or pyuria 2. Scr slowly improving cont with gentle hydration 3. Will see patient again on Monday morning.  Continue with IVF's and minimize contrast used. 2. CKD stage 3 presumably due to DM and HTN +/- cocaine 3. SSCP and systolic CHF- awaiting improvement of Scr prior to left heart catheterization 4. Hep C- per primary svc 5. HTN- need better control, per cardiology 6. History of polysubstance abuse- positive for cocaine and possible cause of SSCP 7. DM- per primary svc 8. Rash on legs- Derm to evaluate, but appears to be psoriasis.  Treatment per primary svc with steroid cream if appropriate.  [place]

## 2016-07-05 NOTE — Progress Notes (Signed)
Progress Note    Minus Brigner  [id number] DOB: 22-Jun-1961  DOA: 07/01/2016 PCP: Lance Bosch, NP    Brief Narrative:   Brad [name] is an 55 y.o. male with a PMH of hypertension, hyperlipidemia, insulin-dependent diabetes, nonischemic cardiomyopathy with an EF of 50% per echo done [id number], chronic diastolic dysfunction, history of polysubstance abuse including heroin and cocaine, recent hospital admission for evaluation of elevated troponin (no ischemic workup at initiated during that hospitalization), who was admitted 07/01/16 with chief complaint of chest pain. Troponin was elevated on admission. Nuclear stress test done 07/02/16 and indicated an EF of 35% (down from 50-55% in 05/2016) with anteroseptal and apical hypokinesis. Plan was to proceed with left heart catheterization 07/03/16 however the patient's rising creatinine precluded doing it at that time.  Assessment/Plan:   Principal Problem:   Chest pain with elevated troponin, reduction in EF on Myoview concerning for acute coronary syndrome Details noted above, but plan now is to proceed with left heart catheterization once renal function stabilizes. Continue aspirin, Imdur and statin. Continue nitroglycerin as needed.  Active Problems:   Heroin addiction (HCC)/cocaine abuse/history of drug abuse/polysubstance abuse Social work consultation for substance abuse counseling. HIV tested back in January and was negative.    GERD (gastroesophageal reflux disease) Continue Protonix.    Depression Continue Elavil and Zoloft.    Chronic combined systolic and diastolic heart failure, NYHA class 1 ([place]) Prior EF 50% 6/17, 2-D echo repeated and EF down to 35%. Currently compensated.    Chronic hepatitis C with cirrhosis (HCC) INR 1.15.     Benign essential HTN/accelerated hypertension Continue hydralazine.    Uncontrolled diabetes mellitus with diabetic nephropathy, with long-term current use of insulin (HCC) Currently  being managed with insulin sensitive SSI and 40 units of Lantus daily. CBGs 85-288. Hemoglobin A1c is 9.5%. Suspect patient has compliance issues given his active polysubstance abuse.    Hypokalemia Monitor and replace as needed.    AKI/CKD (chronic kidney disease) stage 3, GFR 30-59 ml/min Baseline creatinine around 1.5. Current creatinine improved and almost back to baseline values. Nephrologist following. Renal ultrasound negative for obstruction. Normal complements and only minimal proteinuria, no hematuria or pyuria. Scr slowly improving, continue with gentle hydration.    Painful diabetic neuropathy (HCC) Continue gabapentin.    Normocytic anemia/mild thrombocytopenia Hemoglobin and platelet count have dropped over the past several days, likely dilutional and part. Suspect has underlying anemia of chronic disease and possible bone marrow suppression from polysubstance abuse.   Family Communication/Anticipated D/C date and plan/Code Status   DVT prophylaxis: Lovenox ordered. Code Status: Full Code.  Family Communication: No family at bedside. Disposition Plan: Hopefully home 07/07/16 if cardiac catheterization negative.   Medical Consultants:    Cardiology  Nephrology   Procedures:   None.  Anti-Infectives:   None.  Subjective:   Brad [name] is a bit medication seeking, repeatedly asking for pain medications. Reports 8/10 chest pain at its worst. No nausea, vomiting, diaphoresis or complaints of headache.  Objective:    Filed Vitals:   07/04/16 0551 07/04/16 1143 07/04/16 2110 07/05/16 0547  BP: 129/90 180/109 158/95 167/94  Pulse: 64 73 75 85  Temp: 98 F (36.7 C)  97.5 F (36.4 C) 98.6 F (37 C)  TempSrc: Oral Oral Oral Oral  Resp: 17 18 18 20   Height:      Weight: 95.074 kg (209 lb 9.6 oz)   95.119 kg (209 lb 11.2 oz)  SpO2: 100% 99% 96%  97%    Intake/Output Summary (Last 24 hours) at 07/05/16 0939 Last data filed at 07/05/16 0858  Gross per  24 hour  Intake   1295 ml  Output    420 ml  Net    875 ml   Filed Weights   07/03/16 0522 07/04/16 0551 07/05/16 0547  Weight: 93.396 kg (205 lb 14.4 oz) 95.074 kg (209 lb 9.6 oz) 95.119 kg (209 lb 11.2 oz)    Exam: General exam: Appears calm and comfortable.  Respiratory system: Clear to auscultation. Respiratory effort normal. Cardiovascular system: S1 & S2 heard, RRR. No JVD,  rubs, gallops or clicks. No murmurs. Gastrointestinal system: Abdomen is nondistended, soft and nontender. No organomegaly or masses felt. Normal bowel sounds heard. Central nervous system: Alert and oriented. No focal neurological deficits. Extremities: No clubbing, edema, or cyanosis. Skin: No rashes, lesions or ulcers Psychiatry: Judgement and insight appear impaired. Mood & affect appropriate.   Data Reviewed:   I have personally reviewed following labs and imaging studies:  Labs: Basic Metabolic Panel:  Recent Labs Lab 07/01/16 1216 07/01/16 1838 07/02/16 1019 07/03/16 0459 07/04/16 0151 07/05/16 0321  NA 132*  --  131* 132* 132* 137  K 3.1*  --  3.8 3.9 3.5 4.1  CL 92*  --  96* 99* 98* 102  CO2 27  --  26 23 25 23   GLUCOSE 239*  --  258* 116* 159* 168*  BUN 20  --  31* 42* 41* 30*  CREATININE 2.14* 2.32* 2.42* 2.65* 2.39* 1.84*  CALCIUM 8.9  --  8.6* 8.4* 8.5* 8.9  MG  --   --  1.7  --   --   --   PHOS  --   --  3.1  --   --  2.3*   GFR Estimated Creatinine Clearance: 52.8 mL/min (by C-G formula based on Cr of 1.84). Liver Function Tests:  Recent Labs Lab 07/02/16 1019 07/05/16 0321  ALBUMIN 3.4* 3.1*   Coagulation profile  Recent Labs Lab 07/03/16 0459  INR 1.15    CBC:  Recent Labs Lab 07/01/16 1216 07/01/16 1838 07/04/16 0151 07/05/16 0321  WBC 8.7 7.5 5.0 5.0  HGB 16.4 15.7 13.4 12.5*  HCT 46.1 45.0 40.4 36.6*  MCV 94.3 94.9 96.0 96.1  PLT 181 171 143* 134*   Cardiac Enzymes:  Recent Labs Lab 07/01/16 1838 07/02/16 0102 07/02/16 0555  TROPONINI  0.11* 0.08* 0.09*   BNP (last 3 results) No results for input(s): PROBNP in the last 8760 hours. CBG:  Recent Labs Lab 07/04/16 0609 07/04/16 1145 07/04/16 1651 07/04/16 2125 07/05/16 0550  GLUCAP 185* 85 184* 288* 232*   Microbiology No results found for this or any previous visit (from the past 240 hour(s)).  Radiology: US Renal  07/03/2016  CLINICAL DATA:  Elevated creatinine, acute kidney injury. EXAM: RENAL / URINARY TRACT ULTRASOUND COMPLETE COMPARISON:  06/26/2016 FINDINGS: Right Kidney: Length: 11.1 cm. Echogenicity within normal limits. No mass or hydronephrosis visualized. Left Kidney: Length: 11.1 cm. Echogenicity within normal limits. No mass or hydronephrosis visualized. 1.2 by 1.2 by 1.0 cm left mid kidney simple appearing cyst. 0.8 by 0.8 by 0.6 cm left kidney lower pole hypoechoic lesion compatible with cyst. Bladder: Appears normal for degree of bladder distention. IMPRESSION: 1. Several small left renal cyst but otherwise normal sonographic appearance of the kidneys in urinary bladder. Electronically Signed   By: Van Clines M.D.   On: 07/03/2016 14:43    Medications:   .  amitriptyline  75 mg Oral QHS  . aspirin  81 mg Oral Daily  . atorvastatin  10 mg Oral Daily  . cloNIDine  0.1 mg Oral BID  . enoxaparin (LOVENOX) injection  40 mg Subcutaneous Q24H  . gabapentin  300 mg Oral TID  . hydrALAZINE  25 mg Oral TID  . insulin aspart  0-5 Units Subcutaneous QHS  . insulin aspart  0-9 Units Subcutaneous TID WC  . insulin glargine  40 Units Subcutaneous BID  . isosorbide mononitrate  30 mg Oral Daily  . pantoprazole  40 mg Oral Daily  . sertraline  100 mg Oral Daily  . triamcinolone ointment   Topical Daily   Continuous Infusions: . sodium chloride Stopped (07/04/16 1424)    Time spent: 35 minutes.  The patient is medically complex with multiple co-morbidities and is at high risk for clinical deterioration and requires high complexity decision  making.    LOS: 2 days   Earlean Fidalgo  [facility] Pager [phone]. If unable to reach me by pager, please call my cell phone at [phone].  *Please refer to [email or web address], password [email or web address] to get updated schedule on who will round on this patient, as hospitalists switch teams weekly. If 7PM-7AM, please contact night-coverage at [email or web address], password [email or web address] for any overnight needs.  07/05/2016, 9:39 AM

## 2016-07-05 NOTE — Progress Notes (Signed)
Pt seeing and speaking to people in the room. Intermittently states he realizes they are not there after a while.

## 2016-07-06 DIAGNOSIS — I5023 Acute on chronic systolic (congestive) heart failure: Secondary | ICD-10-CM

## 2016-07-06 DIAGNOSIS — F1120 Opioid dependence, uncomplicated: Secondary | ICD-10-CM

## 2016-07-06 DIAGNOSIS — I209 Angina pectoris, unspecified: Secondary | ICD-10-CM

## 2016-07-06 DIAGNOSIS — I214 Non-ST elevation (NSTEMI) myocardial infarction: Secondary | ICD-10-CM | POA: Insufficient documentation

## 2016-07-06 DIAGNOSIS — D649 Anemia, unspecified: Secondary | ICD-10-CM

## 2016-07-06 DIAGNOSIS — E1140 Type 2 diabetes mellitus with diabetic neuropathy, unspecified: Secondary | ICD-10-CM

## 2016-07-06 LAB — GLUCOSE, CAPILLARY
Glucose-Capillary: 132 mg/dL — ABNORMAL HIGH (ref 65–99)
Glucose-Capillary: 157 mg/dL — ABNORMAL HIGH (ref 65–99)
Glucose-Capillary: 160 mg/dL — ABNORMAL HIGH (ref 65–99)
Glucose-Capillary: 190 mg/dL — ABNORMAL HIGH (ref 65–99)
Glucose-Capillary: 203 mg/dL — ABNORMAL HIGH (ref 65–99)

## 2016-07-06 LAB — CBC
HCT: 40.2 % (ref 39.0–52.0)
Hemoglobin: 13.9 g/dL (ref 13.0–17.0)
MCH: 32.8 pg (ref 26.0–34.0)
MCHC: 34.6 g/dL (ref 30.0–36.0)
MCV: 94.8 fL (ref 78.0–100.0)
Platelets: 153 10*3/uL (ref 150–400)
RBC: 4.24 MIL/uL (ref 4.22–5.81)
RDW: 11.3 % — ABNORMAL LOW (ref 11.5–15.5)
WBC: 6.1 10*3/uL (ref 4.0–10.5)

## 2016-07-06 LAB — RENAL FUNCTION PANEL
Albumin: 3.1 g/dL — ABNORMAL LOW (ref 3.5–5.0)
Anion gap: 8 (ref 5–15)
BUN: 22 mg/dL — ABNORMAL HIGH (ref 6–20)
CO2: 27 mmol/L (ref 22–32)
Calcium: 8.6 mg/dL — ABNORMAL LOW (ref 8.9–10.3)
Chloride: 99 mmol/L — ABNORMAL LOW (ref 101–111)
Creatinine, Ser: 1.51 mg/dL — ABNORMAL HIGH (ref 0.61–1.24)
GFR calc Af Amer: 58 mL/min — ABNORMAL LOW (ref >60)
GFR calc non Af Amer: 50 mL/min — ABNORMAL LOW (ref >60)
Glucose, Bld: 158 mg/dL — ABNORMAL HIGH (ref 65–99)
Phosphorus: 2.6 mg/dL (ref 2.5–4.6)
Potassium: 3.2 mmol/L — ABNORMAL LOW (ref 3.5–5.1)
Sodium: 134 mmol/L — ABNORMAL LOW (ref 135–145)

## 2016-07-06 LAB — TROPONIN I
Troponin I: 0.08 ng/mL (ref <0.03)
Troponin I: 0.08 ng/mL (ref <0.03)

## 2016-07-06 MED ORDER — INSULIN ASPART 100 UNIT/ML ~~LOC~~ SOLN
0.0000 [IU] | Freq: Every day | SUBCUTANEOUS | Status: DC
  Administered 2016-07-06: 2 [IU] via SUBCUTANEOUS

## 2016-07-06 MED ORDER — POTASSIUM CHLORIDE CRYS ER 20 MEQ PO TBCR
40.0000 meq | EXTENDED_RELEASE_TABLET | Freq: Once | ORAL | Status: AC
  Administered 2016-07-06: 40 meq via ORAL
  Filled 2016-07-06: qty 2

## 2016-07-06 MED ORDER — INSULIN ASPART 100 UNIT/ML ~~LOC~~ SOLN
4.0000 [IU] | Freq: Three times a day (TID) | SUBCUTANEOUS | Status: DC
  Administered 2016-07-06 – 2016-07-07 (×3): 4 [IU] via SUBCUTANEOUS

## 2016-07-06 MED ORDER — INSULIN ASPART 100 UNIT/ML ~~LOC~~ SOLN
0.0000 [IU] | Freq: Three times a day (TID) | SUBCUTANEOUS | Status: DC
  Administered 2016-07-06 (×2): 3 [IU] via SUBCUTANEOUS
  Administered 2016-07-07: 2 [IU] via SUBCUTANEOUS

## 2016-07-06 NOTE — Progress Notes (Signed)
Patient Name: Brad [name] Date of Encounter: 07/06/2016  Primary Cardiologist: Dr. Debara Pickett   Principal Problem:   Chest pain Active Problems:   Heroin addiction ([place])   History of drug abuse   GERD (gastroesophageal reflux disease)   Depression   Chronic combined systolic and diastolic heart failure, NYHA class 1 (HCC)   Polysubstance abuse   Chronic hepatitis C with cirrhosis (HCC)   Elevated troponin   Benign essential HTN   Uncontrolled diabetes mellitus with diabetic nephropathy, with long-term current use of insulin (HCC)   Hypokalemia   CKD (chronic kidney disease) stage 3, GFR 30-59 ml/min   Painful diabetic neuropathy (HCC)   Cocaine abuse   AKI (acute kidney injury) ([place])   Normocytic anemia   Thrombocytopenia ([place])   SUBJECTIVE  The patient states that he has intermittent neck and right shoulder pain that's spreading across his anterior chest. Denies any shortness of breath.  CURRENT MEDS . amitriptyline  75 mg Oral QHS  . aspirin  81 mg Oral Daily  . atorvastatin  10 mg Oral Daily  . cloNIDine  0.1 mg Oral BID  . enoxaparin (LOVENOX) injection  40 mg Subcutaneous Q24H  . gabapentin  300 mg Oral TID  . hydrALAZINE  25 mg Oral TID  . insulin aspart  0-15 Units Subcutaneous TID WC  . insulin aspart  0-5 Units Subcutaneous QHS  . insulin aspart  4 Units Subcutaneous TID WC  . insulin glargine  40 Units Subcutaneous BID  . isosorbide mononitrate  30 mg Oral Daily  . pantoprazole  40 mg Oral Daily  . sertraline  100 mg Oral Daily  . triamcinolone ointment   Topical Daily   OBJECTIVE  Filed Vitals:   07/05/16 0547 07/05/16 1209 07/05/16 2047 07/06/16 0530  BP: 167/94 179/91 139/85 176/97  Pulse: 85 76 79 69  Temp: 98.6 F (37 C) 98.4 F (36.9 C) 98.6 F (37 C) 98.2 F (36.8 C)  TempSrc: Oral Oral Oral Oral  Resp: 20 20 18 20   Height:      Weight: 209 lb 11.2 oz (95.119 kg)   207 lb 11.2 oz (94.212 kg)  SpO2: 97% 96% 97% 98%    Intake/Output  Summary (Last 24 hours) at 07/06/16 0935 Last data filed at 07/06/16 0650  Gross per 24 hour  Intake   1560 ml  Output   1550 ml  Net     10 ml   Filed Weights   07/04/16 0551 07/05/16 0547 07/06/16 0530  Weight: 209 lb 9.6 oz (95.074 kg) 209 lb 11.2 oz (95.119 kg) 207 lb 11.2 oz (94.212 kg)    PHYSICAL EXAM  General: Pleasant, NAD. Neuro: Alert and oriented X 3. Moves all extremities spontaneously. Psych: Normal affect. HEENT:  Normal  Neck: Supple without bruits or JVD. Lungs:  Resp regular and unlabored, CTA. Heart: RRR no s3, s4, or murmurs. Abdomen: Soft, non-tender, BS + x 4.  +tenderness in epigastric region. Extremities: No clubbing, cyanosis or edema. DP/PT/Radials 2+ and equal bilaterally. R shoulder tenderness with pressure   Accessory Clinical Findings  CBC  Recent Labs  07/05/16 0321 07/06/16 0536  WBC 5.0 6.1  HGB 12.5* 13.9  HCT 36.6* 40.2  MCV 96.1 94.8  PLT 134* 0000000   Basic Metabolic Panel  Recent Labs  07/05/16 0321 07/06/16 0305  NA 137 134*  K 4.1 3.2*  CL 102 99*  CO2 23 27  GLUCOSE 168* 158*  BUN 30* 22*  CREATININE 1.84*  1.51*  CALCIUM 8.9 8.6*  PHOS 2.3* 2.6   Liver Function Tests  Recent Labs  07/05/16 0321 07/06/16 0305  ALBUMIN 3.1* 3.1*   Cardiac Enzymes No results for input(s): CKTOTAL, CKMB, CKMBINDEX, TROPONINI in the last 72 hours. Hemoglobin A1C No results for input(s): HGBA1C in the last 72 hours.  TELE NSR without significant ventricular ectopy    ECG  No new EKG  Echocardiogram 05/27/2016  LV EF: 50% - 55%  ------------------------------------------------------------------- Indications: Syncope 780.2.  ------------------------------------------------------------------- History: PMH: Polysubstance Abuse, NICM. Risk factors: Current tobacco use. Hypertension. Diabetes mellitus.  ------------------------------------------------------------------- Study Conclusions  - Left ventricle:  The cavity size was normal. Wall thickness was  increased in a pattern of severe LVH. Systolic function was  normal. The estimated ejection fraction was in the range of 50%  to 55%. Wall motion was normal; there were no regional wall  motion abnormalities. Doppler parameters are consistent with  abnormal left ventricular relaxation (grade 1 diastolic  dysfunction). - Mitral valve: There was mild regurgitation. - Left atrium: The atrium was mildly dilated.    Radiology/Studies  Ct Abdomen Pelvis Wo Contrast  06/26/2016  CLINICAL DATA:  Initial evaluation for acute nausea a, right upper quadrant pain. EXAM: CT ABDOMEN AND PELVIS WITHOUT CONTRAST TECHNIQUE: Multidetector CT imaging of the abdomen and pelvis was performed following the standard protocol without IV contrast. COMPARISON:  Prior ultrasound from earlier the same day. FINDINGS: Minimal subsegmental atelectasis seen dependently within the visualized lung bases. Visualized lungs are otherwise clear. Coronary artery calcifications are partially visualized. Trace pericardial fluid noted. No pleural effusion. Liver demonstrates a nodular contour, compatible with cirrhosis. Evaluation for focal intrahepatic mass limited on this noncontrast examination. Possible gastrohepatic varices noted. Gallbladder within normal limits. No biliary dilatation. Spleen within normal limits for size and appearance. Adrenal glands and pancreas demonstrate a normal unenhanced appearance. Kidneys are equal in size without evidence of nephrolithiasis or hydronephrosis. Subcentimeter hypodense lesion within the left kidney too small the characterize on this exam, but may reflect a small cyst. No radiopaque calculi seen along the course of either renal collecting system. There is no hydroureter. Stomach within normal limits. No evidence for bowel obstruction. No abnormal wall thickening or inflammatory fat stranding seen about the bowels. Appendix well visualized in  the right lower quadrant and is of normal caliber and appearance associated inflammatory changes to suggest acute appendicitis. Mild circumferential bladder wall thickening, suspected to be related incomplete distension and/or chronic outlet obstruction. Prostate is enlarged with median lobe hypertrophy. Prostate measures 5.7 cm in transverse diameter. No free air or fluid. No ascites. Few mildly prominent porta hepatis nodes measure up to 9 mm, likely related underlying intrinsic liver disease. No pathologically enlarged intra-abdominal or pelvic lymph nodes identified. Scattered aorto bi-iliac atherosclerotic calcifications, moderate in nature. Aorta measures up to 3.1 cm in greatest diameter just above the takeoff of the celiac axis. Scattered soft tissue density within the subcutaneous fat of the anterior abdomen, likely related to injection sites. No acute osseous abnormality. No worrisome lytic or blastic osseous lesions. IMPRESSION: 1. No acute intra-abdominal or pelvic process identified. 2. Nodular contour of the liver, consistent with cirrhosis. 3. Enlarged prostate. Mild circumferential bladder wall thickening suspected to be related to chronic outlet obstruction. 4. Coronary artery calcifications with moderate aorto bi-iliac atherosclerotic disease. Intra-abdominal aorta measures up to 3.1 cm in diameter. Recommend followup by ultrasound in 3 years. This recommendation follows ACR consensus guidelines: White Paper of the ACR Incidental Findings Committee II  on Vascular Findings. [facility] Radiol 2013; 10:789-794 Electronically Signed   By: Jeannine Boga M.D.   On: 06/26/2016 06:25   Dg Chest 2 View  07/01/2016  CLINICAL DATA:  Sweats and left upper extremity pain EXAM: CHEST  2 VIEW COMPARISON:  June 26, 2016 FINDINGS: There is slight scarring in the left lower lung zone. Lungs elsewhere are clear. Heart size and pulmonary vascularity are normal. No adenopathy. There is calcification in the  aortic arch region. There is degenerative change in the lower thoracic spine region. IMPRESSION: Mild scarring left lower lung zone. No edema or consolidation. Aortic atherosclerosis. Electronically Signed   By: Lowella Grip III M.D.   On: 07/01/2016 13:23   US Renal  07/03/2016  CLINICAL DATA:  Elevated creatinine, acute kidney injury. EXAM: RENAL / URINARY TRACT ULTRASOUND COMPLETE COMPARISON:  06/26/2016 FINDINGS: Right Kidney: Length: 11.1 cm. Echogenicity within normal limits. No mass or hydronephrosis visualized. Left Kidney: Length: 11.1 cm. Echogenicity within normal limits. No mass or hydronephrosis visualized. 1.2 by 1.2 by 1.0 cm left mid kidney simple appearing cyst. 0.8 by 0.8 by 0.6 cm left kidney lower pole hypoechoic lesion compatible with cyst. Bladder: Appears normal for degree of bladder distention. IMPRESSION: 1. Several small left renal cyst but otherwise normal sonographic appearance of the kidneys in urinary bladder. Electronically Signed   By: Van Clines M.D.   On: 07/03/2016 14:43   Nm Myocar Multi W/spect W/wall Motion / Ef  07/02/2016   Defect 1: There is a large defect of moderate severity present in the basal inferior, basal inferolateral, basal anterolateral, mid inferior, mid inferolateral and mid anterolateral location.  Findings consistent with ischemia.  This is an intermediate risk study.  Nuclear stress EF: 35%. There is anteroseptal/apical hypokinesis  Abnormal study. Relayed findings to [employer]. Candee Furbish, MD   Dg Abd Acute W/chest  06/26/2016  CLINICAL DATA:  Acute onset of generalized abdominal pain and nausea. Initial encounter. EXAM: DG ABDOMEN ACUTE W/ 1V CHEST COMPARISON:  Chest radiograph and CTA of the chest performed 05/27/2016, and abdominal ultrasound performed 04/26/2012 FINDINGS: The lungs are well-aerated. Pulmonary vascularity is at the upper limits of normal. There is no evidence of focal opacification, pleural effusion or  pneumothorax. The cardiomediastinal silhouette is within normal limits. The visualized bowel gas pattern is unremarkable. Scattered stool and air are seen within the colon; there is no evidence of small bowel dilatation to suggest obstruction. No free intra-abdominal air is identified on the provided upright view. No acute osseous abnormalities are seen; the sacroiliac joints are unremarkable in appearance. IMPRESSION: 1. Unremarkable bowel gas pattern; no free intra-abdominal air seen. Small amount of stool noted in the colon. 2. No acute cardiopulmonary process seen. Electronically Signed   By: Garald Balding M.D.   On: 06/26/2016 04:04   US Abdomen Limited Ruq  06/26/2016  CLINICAL DATA:  Acute onset of right upper quadrant abdominal pain. Initial encounter. EXAM: US ABDOMEN LIMITED - RIGHT UPPER QUADRANT COMPARISON:  Abdominal ultrasound performed 04/26/2012 FINDINGS: Gallbladder: No gallstones or wall thickening visualized. No sonographic Murphy sign noted by sonographer. Common bile duct: Diameter: 0.3 cm, within normal limits in caliber. Liver: No focal lesion identified. The mildly nodular contour of the liver raises question for mild hepatic cirrhosis. Parenchymal echogenicity is mildly heterogeneous. IMPRESSION: 1. No acute abnormality seen at the right upper quadrant. 2. Mildly nodular contour of the liver raises question for mild hepatic cirrhosis. Would correlate with LFTs. Electronically Signed  By: Garald Balding M.D.   On: 06/26/2016 05:01    ASSESSMENT AND PLAN  Miquan Yeagley is a 55 y.o. year old male with a history of HTN, DM, NICM (EF of 50-55% on echo 05/27/16), Hepatitis C, orthostatic hypotension, chronic diastolic dysfunction, tobacco abuse and prior drug abuse presented with epigastric pain. Nonobstructive CAD on cath 09/2015. UDS positive for cocaine. Hgb A1C 9.5.   1. Persistent chest pain  - Echo 05/27/2016 EF 50-55%, severe LVH, G1DD, mild MR.   - myoview large defect of  moderate severity present in basal inferior, basal inferolateral, basal anterolateral, mid inferior, mid inferolateral and mid anterolateral location, consistent with ischemia, EF 35%  - cath delayed due to worsening renal function. Nephrology consulted on 7/13  - once renal function approaches baseline per nephrology, will attempt cardiac catheterization. His symptom is somewhat atypical, R shoulder constant pain worse with lifting arm and pressure, epigastric pain since myoview worse with palpation, but stress test is very much abnormal with decreased EF. His creatinine has significantly improved, we'll schedule him for left sided cardiac catheterization for tomorrow.    2. Cocaine abuse: positive UDT  3. HTN: Imdur was added, he remains hypertensive, I would increase hydralazine to 50 mg by mouth 3 times a day and Imdur to 60 mg by mouth daily.  4. Acute on chronic renal insufficiency: evaluated by Dr. Joelyn Oms Nephrology, felt to be related to chronic HTN and DM2. Recommended hydration. Renal U/S 7/13 several small left renal cyst, but otherwise normal kidney. Crea 2.65 --> 1.51.  Signed, Ena Dawley, MD Pager: [phone]

## 2016-07-06 NOTE — Progress Notes (Signed)
Progress Note    Brad [name]  [id number] DOB: 1961-05-24  DOA: 07/01/2016 PCP: Lance Bosch, NP    Brief Narrative:   Brad [name] is an 54 y.o. male with a PMH of hypertension, hyperlipidemia, insulin-dependent diabetes, nonischemic cardiomyopathy with an EF of 50% per echo done [id number], chronic diastolic dysfunction, history of polysubstance abuse including heroin and cocaine, recent hospital admission for evaluation of elevated troponin (no ischemic workup at initiated during that hospitalization), who was admitted 07/01/16 with chief complaint of chest pain. Troponin was elevated on admission. Nuclear stress test done 07/02/16 and indicated an EF of 35% (down from 50-55% in 05/2016) with anteroseptal and apical hypokinesis. Plan was to proceed with left heart catheterization 07/03/16 however the patient's rising creatinine precluded doing it at that time.  Assessment/Plan:   Principal Problem:   Chest pain with elevated troponin, reduction in EF on Myoview concerning for acute coronary syndrome Details noted above, but plan now is to proceed with left heart catheterization 07/07/16. Continue aspirin, Imdur and statin. Continue nitroglycerin as needed. Reports chest pain today, unrelieved after 2 doses of sublingual nitroglycerin. 2 mg of morphine given. 12-lead EKG showed sinus rhythm with first-degree AV block and occasional PVCs with a prolonged QT interval. No acute ischemic changes appreciated. We'll cycle cardiac markers an additional every 6 hours 3.  Active Problems:   Hypokalemia Replete.    Heroin addiction (HCC)/cocaine abuse/history of drug abuse/polysubstance abuse Social work consultation for substance abuse counseling. HIV tested back in January and was negative.    GERD (gastroesophageal reflux disease) Continue Protonix.    Depression Continue Elavil and Zoloft.    Chronic combined systolic and diastolic heart failure, NYHA class 1 ([place]) Prior EF 50%  6/17, 2-D echo repeated and EF down to 35%. Currently compensated.    Chronic hepatitis C with cirrhosis (HCC) INR 1.15.     Benign essential HTN/accelerated hypertension Continue hydralazine.    Uncontrolled diabetes mellitus with diabetic nephropathy, with long-term current use of insulin (HCC) Currently being managed with insulin sensitive SSI and 40 units of Lantus daily. CBGs 132-300. Change SSI to moderate scale and add 4 units of meal coverage. Hemoglobin A1c is 9.5%. Suspect patient has compliance issues given his active polysubstance abuse.    AKI/CKD (chronic kidney disease) stage 3, GFR 30-59 ml/min Baseline creatinine around 1.5. Current creatinine improved and back to baseline values. Nephrologist following. Renal ultrasound negative for obstruction. Normal complements and only minimal proteinuria, no hematuria or pyuria.     Painful diabetic neuropathy (HCC) Continue gabapentin.    Normocytic anemia/mild thrombocytopenia Hemoglobin and platelet count stable after initial drop. Suspect has underlying anemia of chronic disease and possible bone marrow suppression from polysubstance abuse.   Family Communication/Anticipated D/C date and plan/Code Status   DVT prophylaxis: Lovenox ordered. Code Status: Full Code.  Family Communication: No family at bedside. Disposition Plan: Hopefully home 07/07/16 if cardiac catheterization negative.   Medical Consultants:    Cardiology  Nephrology   Procedures:   None.  Anti-Infectives:   None.  Subjective:   Brad [name] continues to be medication seeking, repeatedly asking for IV morphine. Reported severe chest pain this afternoon, unrelieved after 2 sublingual nitroglycerin tablets. No associated diaphoresis or nausea/vomiting.  Objective:    Filed Vitals:   07/05/16 0547 07/05/16 1209 07/05/16 2047 07/06/16 0530  BP: 167/94 179/91 139/85 176/97  Pulse: 85 76 79 69  Temp: 98.6 F (37 C) 98.4 F (36.9 C) 98.6  F (  37 C) 98.2 F (36.8 C)  TempSrc: Oral Oral Oral Oral  Resp: 20 20 18 20   Height:      Weight: 95.119 kg (209 lb 11.2 oz)   94.212 kg (207 lb 11.2 oz)  SpO2: 97% 96% 97% 98%    Intake/Output Summary (Last 24 hours) at 07/06/16 0814 Last data filed at 07/06/16 0650  Gross per 24 hour  Intake   1800 ml  Output   1550 ml  Net    250 ml   Filed Weights   07/04/16 0551 07/05/16 0547 07/06/16 0530  Weight: 95.074 kg (209 lb 9.6 oz) 95.119 kg (209 lb 11.2 oz) 94.212 kg (207 lb 11.2 oz)    Exam: General exam: Appears calm and comfortable.  Respiratory system: Clear to auscultation. Respiratory effort normal. Cardiovascular system: S1 & S2 heard, RRR. No JVD,  rubs, gallops or clicks. No murmurs. Gastrointestinal system: Abdomen is nondistended, soft and nontender. No organomegaly or masses felt. Normal bowel sounds heard. Central nervous system: Alert and oriented. No focal neurological deficits. Extremities: No clubbing, edema, or cyanosis. Skin: No rashes, lesions or ulcers Psychiatry: Judgement and insight appear impaired. Mood & affect appropriate.   Data Reviewed:   I have personally reviewed following labs and imaging studies:  Labs: Basic Metabolic Panel:  Recent Labs Lab 07/02/16 1019 07/03/16 0459 07/04/16 0151 07/05/16 0321 07/06/16 0305  NA 131* 132* 132* 137 134*  K 3.8 3.9 3.5 4.1 3.2*  CL 96* 99* 98* 102 99*  CO2 26 23 25 23 27   GLUCOSE 258* 116* 159* 168* 158*  BUN 31* 42* 41* 30* 22*  CREATININE 2.42* 2.65* 2.39* 1.84* 1.51*  CALCIUM 8.6* 8.4* 8.5* 8.9 8.6*  MG 1.7  --   --   --   --   PHOS 3.1  --   --  2.3* 2.6   GFR Estimated Creatinine Clearance: 64.1 mL/min (by C-G formula based on Cr of 1.51). Liver Function Tests:  Recent Labs Lab 07/02/16 1019 07/05/16 0321 07/06/16 0305  ALBUMIN 3.4* 3.1* 3.1*   Coagulation profile  Recent Labs Lab 07/03/16 0459  INR 1.15    CBC:  Recent Labs Lab 07/01/16 1216 07/01/16 1838  07/04/16 0151 07/05/16 0321 07/06/16 0536  WBC 8.7 7.5 5.0 5.0 6.1  HGB 16.4 15.7 13.4 12.5* 13.9  HCT 46.1 45.0 40.4 36.6* 40.2  MCV 94.3 94.9 96.0 96.1 94.8  PLT 181 171 143* 134* 153   Cardiac Enzymes:  Recent Labs Lab 07/01/16 1838 07/02/16 0102 07/02/16 0555  TROPONINI 0.11* 0.08* 0.09*   BNP (last 3 results) No results for input(s): PROBNP in the last 8760 hours. CBG:  Recent Labs Lab 07/05/16 0550 07/05/16 1637 07/05/16 2217 07/05/16 2321 07/06/16 0629  GLUCAP 232* 300* 132* 146* 157*   Microbiology No results found for this or any previous visit (from the past 240 hour(s)).  Radiology: No results found.  Medications:   . amitriptyline  75 mg Oral QHS  . aspirin  81 mg Oral Daily  . atorvastatin  10 mg Oral Daily  . cloNIDine  0.1 mg Oral BID  . enoxaparin (LOVENOX) injection  40 mg Subcutaneous Q24H  . gabapentin  300 mg Oral TID  . hydrALAZINE  25 mg Oral TID  . insulin aspart  0-5 Units Subcutaneous QHS  . insulin aspart  0-9 Units Subcutaneous TID WC  . insulin glargine  40 Units Subcutaneous BID  . isosorbide mononitrate  30 mg Oral Daily  . pantoprazole  40 mg Oral Daily  . sertraline  100 mg Oral Daily  . triamcinolone ointment   Topical Daily   Continuous Infusions: . sodium chloride 50 mL/hr at 07/06/16 0650    Time spent: 35 minutes.  The patient is medically complex with multiple co-morbidities and is at high risk for clinical deterioration and requires high complexity decision making.    LOS: 3 days   [facility] Pager [phone]. If unable to reach me by pager, please call my cell phone at [phone].  *Please refer to [email or web address], password [email or web address] to get updated schedule on who will round on this patient, as hospitalists switch teams weekly. If 7PM-7AM, please contact night-coverage at [email or web address], password [email or web address] for any overnight needs.  07/06/2016, 8:14 AM

## 2016-07-07 ENCOUNTER — Encounter (HOSPITAL_COMMUNITY)
Admission: EM | Disposition: A | Discharge status: Home / Self Care | Payer: Self-pay | Source: Home / Self Care | Attending: Internal Medicine

## 2016-07-07 ENCOUNTER — Encounter (HOSPITAL_COMMUNITY): Payer: Self-pay | Admitting: Interventional Cardiology

## 2016-07-07 DIAGNOSIS — F1990 Other psychoactive substance use, unspecified, uncomplicated: Secondary | ICD-10-CM

## 2016-07-07 DIAGNOSIS — I5031 Acute diastolic (congestive) heart failure: Secondary | ICD-10-CM

## 2016-07-07 HISTORY — PX: CARDIAC CATHETERIZATION: SHX172

## 2016-07-07 LAB — GLUCOSE, CAPILLARY
Glucose-Capillary: 265 mg/dL — ABNORMAL HIGH (ref 65–99)
Glucose-Capillary: 122 mg/dL — ABNORMAL HIGH (ref 65–99)
Glucose-Capillary: 91 mg/dL (ref 65–99)
Glucose-Capillary: 82 mg/dL (ref 65–99)
Glucose-Capillary: 74 mg/dL (ref 65–99)

## 2016-07-07 LAB — RENAL FUNCTION PANEL
Albumin: 3.1 g/dL — ABNORMAL LOW (ref 3.5–5.0)
Anion gap: 6 (ref 5–15)
BUN: 22 mg/dL — ABNORMAL HIGH (ref 6–20)
CO2: 29 mmol/L (ref 22–32)
Calcium: 8.6 mg/dL — ABNORMAL LOW (ref 8.9–10.3)
Chloride: 98 mmol/L — ABNORMAL LOW (ref 101–111)
Creatinine, Ser: 1.49 mg/dL — ABNORMAL HIGH (ref 0.61–1.24)
GFR calc Af Amer: 59 mL/min — ABNORMAL LOW (ref >60)
GFR calc non Af Amer: 51 mL/min — ABNORMAL LOW (ref >60)
Glucose, Bld: 141 mg/dL — ABNORMAL HIGH (ref 65–99)
Phosphorus: 2.5 mg/dL (ref 2.5–4.6)
Potassium: 3.3 mmol/L — ABNORMAL LOW (ref 3.5–5.1)
Sodium: 133 mmol/L — ABNORMAL LOW (ref 135–145)

## 2016-07-07 LAB — TROPONIN I: Troponin I: 0.08 ng/mL (ref <0.03)

## 2016-07-07 SURGERY — LEFT HEART CATH AND CORONARY ANGIOGRAPHY
Anesthesia: LOCAL

## 2016-07-07 MED ORDER — ACETAMINOPHEN 325 MG PO TABS: 650.0000 mg | ORAL_TABLET | ORAL | Status: DC | PRN

## 2016-07-07 MED ORDER — IOPAMIDOL (ISOVUE-370) INJECTION 76%
INTRAVENOUS | Status: DC | PRN
  Administered 2016-07-07: 40 mL via INTRAVENOUS

## 2016-07-07 MED ORDER — SODIUM CHLORIDE 0.9 % WEIGHT BASED INFUSION: 1.0000 mL/kg/h | INTRAVENOUS | Status: AC

## 2016-07-07 MED ORDER — SODIUM CHLORIDE 0.9 % IV SOLN
INTRAVENOUS | Status: DC | PRN
  Administered 2016-07-07: 10 mL/h via INTRAVENOUS

## 2016-07-07 MED ORDER — FENTANYL CITRATE (PF) 100 MCG/2ML IJ SOLN
INTRAMUSCULAR | Status: DC | PRN
  Administered 2016-07-07: 50 ug via INTRAVENOUS

## 2016-07-07 MED ORDER — SODIUM CHLORIDE 0.9% FLUSH
3.0000 mL | Freq: Two times a day (BID) | INTRAVENOUS | Status: DC
  Administered 2016-07-07 – 2016-07-08 (×2): 3 mL via INTRAVENOUS

## 2016-07-07 MED ORDER — HEPARIN SODIUM (PORCINE) 1000 UNIT/ML IJ SOLN
INTRAMUSCULAR | Status: AC
  Filled 2016-07-07: qty 1

## 2016-07-07 MED ORDER — SODIUM CHLORIDE 0.9 % IV SOLN: 250.0000 mL | INTRAVENOUS | Status: DC | PRN

## 2016-07-07 MED ORDER — MIDAZOLAM HCL 2 MG/2ML IJ SOLN
INTRAMUSCULAR | Status: AC
  Filled 2016-07-07: qty 2

## 2016-07-07 MED ORDER — POTASSIUM CHLORIDE CRYS ER 20 MEQ PO TBCR
40.0000 meq | EXTENDED_RELEASE_TABLET | Freq: Once | ORAL | Status: AC
  Administered 2016-07-07: 40 meq via ORAL
  Filled 2016-07-07: qty 2

## 2016-07-07 MED ORDER — LIDOCAINE HCL (PF) 1 % IJ SOLN
INTRAMUSCULAR | Status: DC | PRN
  Administered 2016-07-07: 2 mL

## 2016-07-07 MED ORDER — MIDAZOLAM HCL 2 MG/2ML IJ SOLN
INTRAMUSCULAR | Status: DC | PRN
  Administered 2016-07-07: 2 mg via INTRAVENOUS

## 2016-07-07 MED ORDER — POTASSIUM CHLORIDE 10 MEQ/100ML IV SOLN
10.0000 meq | INTRAVENOUS | Status: DC
  Administered 2016-07-07: 10 meq via INTRAVENOUS
  Filled 2016-07-07 (×2): qty 100

## 2016-07-07 MED ORDER — VERAPAMIL HCL 2.5 MG/ML IV SOLN
INTRAVENOUS | Status: DC | PRN
  Administered 2016-07-07: 13:00:00 via INTRA_ARTERIAL

## 2016-07-07 MED ORDER — HEPARIN SODIUM (PORCINE) 1000 UNIT/ML IJ SOLN
INTRAMUSCULAR | Status: DC | PRN
  Administered 2016-07-07: 4500 [IU] via INTRAVENOUS

## 2016-07-07 MED ORDER — FENTANYL CITRATE (PF) 100 MCG/2ML IJ SOLN
INTRAMUSCULAR | Status: AC
  Filled 2016-07-07: qty 2

## 2016-07-07 MED ORDER — SODIUM CHLORIDE 0.9% FLUSH: 3.0000 mL | INTRAVENOUS | Status: DC | PRN

## 2016-07-07 MED ORDER — VERAPAMIL HCL 2.5 MG/ML IV SOLN
INTRAVENOUS | Status: AC
  Filled 2016-07-07: qty 2

## 2016-07-07 MED ORDER — ONDANSETRON HCL 4 MG/2ML IJ SOLN: 4.0000 mg | Freq: Four times a day (QID) | INTRAMUSCULAR | Status: DC | PRN

## 2016-07-07 MED ORDER — HEPARIN (PORCINE) IN NACL 2-0.9 UNIT/ML-% IJ SOLN
INTRAMUSCULAR | Status: DC | PRN
  Administered 2016-07-07: 13:00:00

## 2016-07-07 MED ORDER — LIDOCAINE HCL (PF) 1 % IJ SOLN
INTRAMUSCULAR | Status: AC
  Filled 2016-07-07: qty 30

## 2016-07-07 SURGICAL SUPPLY — 10 items
CATH INFINITI 5 FR JL3.5 (CATHETERS) ×2 IMPLANT
CATH INFINITI JR4 5F (CATHETERS) ×2 IMPLANT
DEVICE RAD COMP TR BAND LRG (VASCULAR PRODUCTS) ×2 IMPLANT
GLIDESHEATH SLEND SS 6F .021 (SHEATH) ×2 IMPLANT
KIT HEART LEFT (KITS) ×2 IMPLANT
PACK CARDIAC CATHETERIZATION (CUSTOM PROCEDURE TRAY) ×2 IMPLANT
SYR MEDRAD MARK V 150ML (SYRINGE) ×2 IMPLANT
TRANSDUCER W/STOPCOCK (MISCELLANEOUS) ×2 IMPLANT
TUBING CIL FLEX 10 FLL-RA (TUBING) ×2 IMPLANT
WIRE SAFE-T 1.5MM-J .035X260CM (WIRE) ×2 IMPLANT

## 2016-07-07 NOTE — Interval H&P Note (Signed)
Cath Lab Visit (complete for each Cath Lab visit)  Clinical Evaluation Leading to the Procedure:   ACS: Yes.    Non-ACS:    Anginal Classification: CCS IV  Anti-ischemic medical therapy: Minimal Therapy (1 class of medications)  Non-Invasive Test Results: No non-invasive testing performed  Prior CABG: No previous CABG   Acute heart failure   History and Physical Interval Note:  07/07/2016 12:58 PM  Brad [name]  has presented today for surgery, with the diagnosis of NSTEMI  The various methods of treatment have been discussed with the patient and family. After consideration of risks, benefits and other options for treatment, the patient has consented to  Procedure(s): Left Heart Cath and Coronary Angiography (N/A) as a surgical intervention .  The patient's history has been reviewed, patient examined, no change in status, stable for surgery.  I have reviewed the patient's chart and labs.  Questions were answered to the patient's satisfaction.     Larae Grooms

## 2016-07-07 NOTE — Progress Notes (Signed)
Subjective: Denies CP  Breathing is comfortable  Laying flat   Objective: Filed Vitals:   07/06/16 1955 07/07/16 0658 07/07/16 0700 07/07/16 0805  BP: 158/89 170/100 170/106 163/85  Pulse: 67 67    Temp: 98.3 F (36.8 C) 97.8 F (36.6 C)    TempSrc: Oral Oral    Resp: 18 18    Height:      Weight:  206 lb 11.2 oz (93.759 kg)    SpO2: 99% 100%     Weight change: -1 lb (-0.454 kg)  Intake/Output Summary (Last 24 hours) at 07/07/16 0946 Last data filed at 07/07/16 0850  Gross per 24 hour  Intake   1076 ml  Output    700 ml  Net    376 ml    General: Alert, awake, oriented x3, in no acute distress Neck:  JVP is normal Heart: Regular rate and rhythm, without murmurs, rubs, gallops.  Lungs: Clear to auscultation.  No rales or wheezes. Exemities:  No edema.   Neuro: Grossly intact, nonfocal.  Tele:  SR with PVCs    Lab Results: Results for orders placed or performed during the hospital encounter of 07/01/16 (from the past 24 hour(s))  Glucose, capillary     Status: Abnormal   Collection Time: 07/06/16 11:26 AM  Result Value Ref Range   Glucose-Capillary 160 (H) 65 - 99 mg/dL   Comment 1 Notify RN   Troponin I (q 6hr x 3)     Status: Abnormal   Collection Time: 07/06/16  4:24 PM  Result Value Ref Range   Troponin I 0.08 (HH) <0.03 ng/mL  Glucose, capillary     Status: Abnormal   Collection Time: 07/06/16  5:00 PM  Result Value Ref Range   Glucose-Capillary 190 (H) 65 - 99 mg/dL   Comment 1 Notify RN   Glucose, capillary     Status: Abnormal   Collection Time: 07/06/16  9:14 PM  Result Value Ref Range   Glucose-Capillary 203 (H) 65 - 99 mg/dL   Comment 1 Notify RN    Comment 2 Document in Chart   Troponin I (q 6hr x 3)     Status: Abnormal   Collection Time: 07/06/16  9:16 PM  Result Value Ref Range   Troponin I 0.08 (HH) <0.03 ng/mL  Troponin I (q 6hr x 3)     Status: Abnormal   Collection Time: 07/07/16  3:16 AM  Result Value Ref Range   Troponin I 0.08  (HH) <0.03 ng/mL  Renal function panel     Status: Abnormal   Collection Time: 07/07/16  3:16 AM  Result Value Ref Range   Sodium 133 (L) 135 - 145 mmol/L   Potassium 3.3 (L) 3.5 - 5.1 mmol/L   Chloride 98 (L) 101 - 111 mmol/L   CO2 29 22 - 32 mmol/L   Glucose, Bld 141 (H) 65 - 99 mg/dL   BUN 22 (H) 6 - 20 mg/dL   Creatinine, Ser 1.49 (H) 0.61 - 1.24 mg/dL   Calcium 8.6 (L) 8.9 - 10.3 mg/dL   Phosphorus 2.5 2.5 - 4.6 mg/dL   Albumin 3.1 (L) 3.5 - 5.0 g/dL   GFR calc non Af Amer 51 (L) >60 mL/min   GFR calc Af Amer 59 (L) >60 mL/min   Anion gap 6 5 - 15  Glucose, capillary     Status: Abnormal   Collection Time: 07/07/16  8:07 AM  Result Value Ref Range   Glucose-Capillary 122 (H)  65 - 99 mg/dL    Studies/Results: No results found.  Medications:REviewed   @PROBHOSP @  1  CP  Persistent  E Echo LVEF 50 to 55%  Severe LVH  Myoview with mod moderate severity present in basal inferior, basal inferolateral, basal anterolateral, mid inferior, mid inferolateral and mid anterolateral location, consistent with ischemia, EF 35%  Plan for cath today  Minimal contrast  REceiving hydration now    2  Cocaine abuse  3 HTN  BP isstill not controlled  Address after procedure today    4  Renal   Cr 1.49 today    LOS: 4 days   Dorris Carnes 07/07/2016, 9:46 AM

## 2016-07-07 NOTE — H&P (View-Only) (Signed)
Subjective: Denies CP  Breathing is comfortable  Laying flat   Objective: Filed Vitals:   07/06/16 1955 07/07/16 0658 07/07/16 0700 07/07/16 0805  BP: 158/89 170/100 170/106 163/85  Pulse: 67 67    Temp: 98.3 F (36.8 C) 97.8 F (36.6 C)    TempSrc: Oral Oral    Resp: 18 18    Height:      Weight:  206 lb 11.2 oz (93.759 kg)    SpO2: 99% 100%     Weight change: -1 lb (-0.454 kg)  Intake/Output Summary (Last 24 hours) at 07/07/16 0946 Last data filed at 07/07/16 0850  Gross per 24 hour  Intake   1076 ml  Output    700 ml  Net    376 ml    General: Alert, awake, oriented x3, in no acute distress Neck:  JVP is normal Heart: Regular rate and rhythm, without murmurs, rubs, gallops.  Lungs: Clear to auscultation.  No rales or wheezes. Exemities:  No edema.   Neuro: Grossly intact, nonfocal.  Tele:  SR with PVCs    Lab Results: Results for orders placed or performed during the hospital encounter of 07/01/16 (from the past 24 hour(s))  Glucose, capillary     Status: Abnormal   Collection Time: 07/06/16 11:26 AM  Result Value Ref Range   Glucose-Capillary 160 (H) 65 - 99 mg/dL   Comment 1 Notify RN   Troponin I (q 6hr x 3)     Status: Abnormal   Collection Time: 07/06/16  4:24 PM  Result Value Ref Range   Troponin I 0.08 (HH) <0.03 ng/mL  Glucose, capillary     Status: Abnormal   Collection Time: 07/06/16  5:00 PM  Result Value Ref Range   Glucose-Capillary 190 (H) 65 - 99 mg/dL   Comment 1 Notify RN   Glucose, capillary     Status: Abnormal   Collection Time: 07/06/16  9:14 PM  Result Value Ref Range   Glucose-Capillary 203 (H) 65 - 99 mg/dL   Comment 1 Notify RN    Comment 2 Document in Chart   Troponin I (q 6hr x 3)     Status: Abnormal   Collection Time: 07/06/16  9:16 PM  Result Value Ref Range   Troponin I 0.08 (HH) <0.03 ng/mL  Troponin I (q 6hr x 3)     Status: Abnormal   Collection Time: 07/07/16  3:16 AM  Result Value Ref Range   Troponin I 0.08  (HH) <0.03 ng/mL  Renal function panel     Status: Abnormal   Collection Time: 07/07/16  3:16 AM  Result Value Ref Range   Sodium 133 (L) 135 - 145 mmol/L   Potassium 3.3 (L) 3.5 - 5.1 mmol/L   Chloride 98 (L) 101 - 111 mmol/L   CO2 29 22 - 32 mmol/L   Glucose, Bld 141 (H) 65 - 99 mg/dL   BUN 22 (H) 6 - 20 mg/dL   Creatinine, Ser 1.49 (H) 0.61 - 1.24 mg/dL   Calcium 8.6 (L) 8.9 - 10.3 mg/dL   Phosphorus 2.5 2.5 - 4.6 mg/dL   Albumin 3.1 (L) 3.5 - 5.0 g/dL   GFR calc non Af Amer 51 (L) >60 mL/min   GFR calc Af Amer 59 (L) >60 mL/min   Anion gap 6 5 - 15  Glucose, capillary     Status: Abnormal   Collection Time: 07/07/16  8:07 AM  Result Value Ref Range   Glucose-Capillary 122 (H)  65 - 99 mg/dL    Studies/Results: No results found.  Medications:REviewed   @PROBHOSP @  1  CP  Persistent  E Echo LVEF 50 to 55%  Severe LVH  Myoview with mod moderate severity present in basal inferior, basal inferolateral, basal anterolateral, mid inferior, mid inferolateral and mid anterolateral location, consistent with ischemia, EF 35%  Plan for cath today  Minimal contrast  REceiving hydration now    2  Cocaine abuse  3 HTN  BP isstill not controlled  Address after procedure today    4  Renal   Cr 1.49 today    LOS: 4 days   Dorris Carnes 07/07/2016, 9:46 AM

## 2016-07-07 NOTE — Progress Notes (Signed)
Patient ID: Brad [name], male   DOB: 1961-03-11, 55 y.o.   MRN: [id number] S: feels well  O:BP 163/85 mmHg  Pulse 67  Temp(Src) 97.8 F (36.6 C) (Oral)  Resp 18  Ht 5\' 11"  (1.803 m)  Wt 93.759 kg (206 lb 11.2 oz)  BMI 28.84 kg/m2  SpO2 100%  Intake/Output Summary (Last 24 hours) at 07/07/16 0834 Last data filed at 07/06/16 1838  Gross per 24 hour  Intake    600 ml  Output    700 ml  Net   -100 ml   Intake/Output: I/O last 3 completed shifts: In: 1440 [P.O.:840; I.V.:600] Out: 1650 [Urine:1650]  Intake/Output this shift:    Weight change: -0.454 kg (-1 lb) Gen:WD WN AAM in NAD CVS:no rub Resp:cta [id number] Ext:no edema   Recent Labs Lab 07/01/16 1216 07/01/16 1838 07/02/16 1019 07/03/16 0459 07/04/16 0151 07/05/16 0321 07/06/16 0305 07/07/16 0316  NA 132*  --  131* 132* 132* 137 134* 133*  K 3.1*  --  3.8 3.9 3.5 4.1 3.2* 3.3*  CL 92*  --  96* 99* 98* 102 99* 98*  CO2 27  --  26 23 25 23 27 29   GLUCOSE 239*  --  258* 116* 159* 168* 158* 141*  BUN 20  --  31* 42* 41* 30* 22* 22*  CREATININE 2.14* 2.32* 2.42* 2.65* 2.39* 1.84* 1.51* 1.49*  ALBUMIN  --   --  3.4*  --   --  3.1* 3.1* 3.1*  CALCIUM 8.9  --  8.6* 8.4* 8.5* 8.9 8.6* 8.6*  PHOS  --   --  3.1  --   --  2.3* 2.6 2.5   Liver Function Tests:  Recent Labs Lab 07/05/16 0321 07/06/16 0305 07/07/16 0316  ALBUMIN 3.1* 3.1* 3.1*   No results for input(s): LIPASE, AMYLASE in the last 168 hours. No results for input(s): AMMONIA in the last 168 hours. CBC:  Recent Labs Lab 07/01/16 1216 07/01/16 1838 07/04/16 0151 07/05/16 0321 07/06/16 0536  WBC 8.7 7.5 5.0 5.0 6.1  HGB 16.4 15.7 13.4 12.5* 13.9  HCT 46.1 45.0 40.4 36.6* 40.2  MCV 94.3 94.9 96.0 96.1 94.8  PLT 181 171 143* 134* 153   Cardiac Enzymes:  Recent Labs Lab 07/02/16 0102 07/02/16 0555 07/06/16 1624 07/06/16 2116 07/07/16 0316  TROPONINI 0.08* 0.09* 0.08* 0.08* 0.08*   CBG:  Recent Labs Lab 07/06/16 0629  07/06/16 1126 07/06/16 1700 07/06/16 2114 07/07/16 0807  GLUCAP 157* 160* 190* 203* 122*    Iron Studies: No results for input(s): IRON, TIBC, TRANSFERRIN, FERRITIN in the last 72 hours. Studies/Results: No results found. Marland Kitchen amitriptyline  75 mg Oral QHS  . aspirin  81 mg Oral Daily  . atorvastatin  10 mg Oral Daily  . cloNIDine  0.1 mg Oral BID  . enoxaparin (LOVENOX) injection  40 mg Subcutaneous Q24H  . gabapentin  300 mg Oral TID  . hydrALAZINE  25 mg Oral TID  . insulin aspart  0-15 Units Subcutaneous TID WC  . insulin aspart  0-5 Units Subcutaneous QHS  . insulin aspart  4 Units Subcutaneous TID WC  . insulin glargine  40 Units Subcutaneous BID  . isosorbide mononitrate  30 mg Oral Daily  . pantoprazole  40 mg Oral Daily  . potassium chloride  10 mEq Intravenous Q1 Hr x 4  . sertraline  100 mg Oral Daily  . triamcinolone ointment   Topical Daily    BMET    Component Value  Date/Time   NA 133* 07/07/2016 0316   K 3.3* 07/07/2016 0316   CL 98* 07/07/2016 0316   CO2 29 07/07/2016 0316   GLUCOSE 141* 07/07/2016 0316   BUN 22* 07/07/2016 0316   CREATININE 1.49* 07/07/2016 0316   CREATININE 1.75* 01/04/2016 0929   CALCIUM 8.6* 07/07/2016 0316   GFRNONAA 51* 07/07/2016 0316   GFRNONAA 55* 11/21/2015 1249   GFRAA 59* 07/07/2016 0316   GFRAA 64 11/21/2015 1249   CBC    Component Value Date/Time   WBC 6.1 07/06/2016 0536   RBC 4.24 07/06/2016 0536   HGB 13.9 07/06/2016 0536   HCT 40.2 07/06/2016 0536   PLT 153 07/06/2016 0536   MCV 94.8 07/06/2016 0536   MCH 32.8 07/06/2016 0536   MCHC 34.6 07/06/2016 0536   RDW 11.3* 07/06/2016 0536   LYMPHSABS 2.1 06/26/2016 0336   MONOABS 0.7 06/26/2016 0336   EOSABS 0.1 06/26/2016 0336   BASOSABS 0.0 06/26/2016 0336     27M with AoCKD, chest pain with depressed LEVF and RMWA, HTN, DM2, chronic HCV, rash on legs  Assessment/Plan:  1. AKI/CKD stage 3- in setting of systolic CHF and NSAIDs 1. Normal complements and  only minimal proteinuria, no hematuria or pyuria 2. Scr markedly improved after gentle hydration 3. Judicious use of IV contrast 2. CKD stage 3 presumably due to DM and HTN +/- cocaine 1. Discussed importance of control of DM and HTN as well as abstinence from crack cocaine 2. ACE/ARB on hold due to AKI/CKD. 3. SSCP and systolic CHF- for left heart catheterization today. 4. Hypokalemia- ok to replete with 40 mEq KCl po and follow. 5. Hep C- per primary svc 6. HTN- need better control, per cardiology 7. History of polysubstance abuse- positive for cocaine and possible cause of SSCP 8. DM- per primary svc 9. Rash on legs- Derm to evaluate, doubt HSP since he has no hematuria.  [place]

## 2016-07-07 NOTE — Progress Notes (Signed)
Progress Note    Brad [name]  [id number] DOB: 07-30-1961  DOA: 07/01/2016 PCP: Lance Bosch, NP    Brief Narrative:   Brad [name] is an 55 y.o. male with a PMH of hypertension, hyperlipidemia, insulin-dependent diabetes, nonischemic cardiomyopathy with an EF of 50% per echo done [id number], chronic diastolic dysfunction, history of polysubstance abuse including heroin and cocaine, recent hospital admission for evaluation of elevated troponin (no ischemic workup at initiated during that hospitalization), who was admitted 07/01/16 with chief complaint of chest pain. Troponin was elevated on admission. Nuclear stress test done 07/02/16 and indicated an EF of 35% (down from 50-55% in 05/2016) with anteroseptal and apical hypokinesis. Plan was to proceed with left heart catheterization 07/03/16 however the patient's rising creatinine precluded doing it at that time.  Assessment/Plan:   Principal Problem:   Chest pain with elevated troponin, reduction in EF on Myoview concerning for acute coronary syndrome Details noted above. Continue aspirin, Imdur and statin. Continue nitroglycerin as needed. Reports chest pain today, unrelieved after 2 doses of sublingual nitroglycerin. 2 mg of morphine given. 12-lead EKG showed sinus rhythm with first-degree AV block and occasional PVCs with a prolonged QT interval. No acute ischemic changes appreciated. Troponins persistently elevated but trend remains flat. Follow-up results of cardiac catheterization.  Active Problems:   Hypokalemia Repleting.    Heroin addiction (HCC)/cocaine abuse/history of drug abuse/polysubstance abuse Social work consultation for substance abuse counseling. HIV tested back in January and was negative.    GERD (gastroesophageal reflux disease) Continue Protonix.    Depression Continue Elavil and Zoloft.    Chronic combined systolic and diastolic heart failure, NYHA class 1 ([place]) Prior EF 50% 6/17, 2-D echo repeated and  EF down to 35%. Currently compensated.    Chronic hepatitis C with cirrhosis (HCC) INR 1.15.     Benign essential HTN/accelerated hypertension Continue Imdur, hydralazine and clonidine. Also has labetalol ordered as needed.    Uncontrolled diabetes mellitus with diabetic nephropathy, with long-term current use of insulin (HCC) Currently being managed with insulin sensitive SSI and 40 units of Lantus daily. CBGs 132-300. Change SSI to moderate scale and add 4 units of meal coverage. Hemoglobin A1c is 9.5%. Suspect patient has compliance issues given his active polysubstance abuse.    AKI/CKD (chronic kidney disease) stage 3, GFR 30-59 ml/min Baseline creatinine around 1.5. Current creatinine improved and back to baseline values. Nephrologist following. Renal ultrasound negative for obstruction. Normal complements and only minimal proteinuria, no hematuria or pyuria.     Painful diabetic neuropathy (HCC) Continue gabapentin.    Normocytic anemia/mild thrombocytopenia Hemoglobin and platelet count stable after initial drop. Suspect has underlying anemia of chronic disease and possible bone marrow suppression from polysubstance abuse.   Family Communication/Anticipated D/C date and plan/Code Status   DVT prophylaxis: Lovenox ordered. Code Status: Full Code.  Family Communication: No family at bedside. Disposition Plan: Hopefully home 07/08/16 if cardiac catheterization negative.   Medical Consultants:    Cardiology  Nephrology   Procedures:   None.  Anti-Infectives:   None.  Subjective:   Brad [name] feels okay right now, but has had intermittent chest and right-sided shoulder/neck pain over the past 24 hours which he describes as intermittent, only relieved by morphine.  Objective:    Filed Vitals:   07/06/16 1955 07/07/16 0658 07/07/16 0700 07/07/16 0805  BP: 158/89 170/100 170/106 163/85  Pulse: 67 67    Temp: 98.3 F (36.8 C) 97.8 F (36.6 C)  TempSrc:  Oral Oral    Resp: 18 18    Height:      Weight:  93.759 kg (206 lb 11.2 oz)    SpO2: 99% 100%      Intake/Output Summary (Last 24 hours) at 07/07/16 0828 Last data filed at 07/06/16 1838  Gross per 24 hour  Intake    600 ml  Output    700 ml  Net   -100 ml   Filed Weights   07/05/16 0547 07/06/16 0530 07/07/16 0658  Weight: 95.119 kg (209 lb 11.2 oz) 94.212 kg (207 lb 11.2 oz) 93.759 kg (206 lb 11.2 oz)    Exam: General exam: Appears calm and comfortable.  Respiratory system: Clear to auscultation. Respiratory effort normal. Cardiovascular system: S1 & S2 heard, RRR. No JVD,  rubs, gallops or clicks. No murmurs. Gastrointestinal system: Abdomen is nondistended, soft and nontender. No organomegaly or masses felt. Normal bowel sounds heard. Central nervous system: Alert and oriented. No focal neurological deficits. Extremities: No clubbing, edema, or cyanosis. Skin: No rashes, lesions or ulcers Psychiatry: Judgement and insight appear impaired. Mood & affect appropriate.   Data Reviewed:   I have personally reviewed following labs and imaging studies:  Labs: Basic Metabolic Panel:  Recent Labs Lab 07/02/16 1019 07/03/16 0459 07/04/16 0151 07/05/16 0321 07/06/16 0305 07/07/16 0316  NA 131* 132* 132* 137 134* 133*  K 3.8 3.9 3.5 4.1 3.2* 3.3*  CL 96* 99* 98* 102 99* 98*  CO2 26 23 25 23 27 29   GLUCOSE 258* 116* 159* 168* 158* 141*  BUN 31* 42* 41* 30* 22* 22*  CREATININE 2.42* 2.65* 2.39* 1.84* 1.51* 1.49*  CALCIUM 8.6* 8.4* 8.5* 8.9 8.6* 8.6*  MG 1.7  --   --   --   --   --   PHOS 3.1  --   --  2.3* 2.6 2.5   GFR Estimated Creatinine Clearance: 64.8 mL/min (by C-G formula based on Cr of 1.49). Liver Function Tests:  Recent Labs Lab 07/02/16 1019 07/05/16 0321 07/06/16 0305 07/07/16 0316  ALBUMIN 3.4* 3.1* 3.1* 3.1*   Coagulation profile  Recent Labs Lab 07/03/16 0459  INR 1.15    CBC:  Recent Labs Lab 07/01/16 1216 07/01/16 1838  07/04/16 0151 07/05/16 0321 07/06/16 0536  WBC 8.7 7.5 5.0 5.0 6.1  HGB 16.4 15.7 13.4 12.5* 13.9  HCT 46.1 45.0 40.4 36.6* 40.2  MCV 94.3 94.9 96.0 96.1 94.8  PLT 181 171 143* 134* 153   Cardiac Enzymes:  Recent Labs Lab 07/02/16 0102 07/02/16 0555 07/06/16 1624 07/06/16 2116 07/07/16 0316  TROPONINI 0.08* 0.09* 0.08* 0.08* 0.08*   BNP (last 3 results) No results for input(s): PROBNP in the last 8760 hours. CBG:  Recent Labs Lab 07/06/16 0629 07/06/16 1126 07/06/16 1700 07/06/16 2114 07/07/16 0807  GLUCAP 157* 160* 190* 203* 122*   Microbiology No results found for this or any previous visit (from the past 240 hour(s)).  Radiology: No results found.  Medications:   . amitriptyline  75 mg Oral QHS  . aspirin  81 mg Oral Daily  . atorvastatin  10 mg Oral Daily  . cloNIDine  0.1 mg Oral BID  . enoxaparin (LOVENOX) injection  40 mg Subcutaneous Q24H  . gabapentin  300 mg Oral TID  . hydrALAZINE  25 mg Oral TID  . insulin aspart  0-15 Units Subcutaneous TID WC  . insulin aspart  0-5 Units Subcutaneous QHS  . insulin aspart  4 Units Subcutaneous  TID WC  . insulin glargine  40 Units Subcutaneous BID  . isosorbide mononitrate  30 mg Oral Daily  . pantoprazole  40 mg Oral Daily  . sertraline  100 mg Oral Daily  . triamcinolone ointment   Topical Daily   Continuous Infusions: . sodium chloride 50 mL/hr at 07/07/16 0300    Time spent: 25 minutes.    LOS: 4 days   [facility] Pager [phone]. If unable to reach me by pager, please call my cell phone at [phone].  *Please refer to [email or web address], password [email or web address] to get updated schedule on who will round on this patient, as hospitalists switch teams weekly. If 7PM-7AM, please contact night-coverage at [email or web address], password [email or web address] for any overnight needs.  07/07/2016, 8:28 AM

## 2016-07-08 LAB — GLUCOSE, CAPILLARY
Glucose-Capillary: 156 mg/dL — ABNORMAL HIGH (ref 65–99)
Glucose-Capillary: 96 mg/dL (ref 65–99)
Glucose-Capillary: 86 mg/dL (ref 65–99)

## 2016-07-08 LAB — RENAL FUNCTION PANEL
Albumin: 3.3 g/dL — ABNORMAL LOW (ref 3.5–5.0)
Anion gap: 9 (ref 5–15)
BUN: 21 mg/dL — ABNORMAL HIGH (ref 6–20)
CO2: 25 mmol/L (ref 22–32)
Calcium: 9.1 mg/dL (ref 8.9–10.3)
Chloride: 101 mmol/L (ref 101–111)
Creatinine, Ser: 1.49 mg/dL — ABNORMAL HIGH (ref 0.61–1.24)
GFR calc Af Amer: 59 mL/min — ABNORMAL LOW (ref >60)
GFR calc non Af Amer: 51 mL/min — ABNORMAL LOW (ref >60)
Glucose, Bld: 89 mg/dL (ref 65–99)
Phosphorus: 3.3 mg/dL (ref 2.5–4.6)
Potassium: 3.5 mmol/L (ref 3.5–5.1)
Sodium: 135 mmol/L (ref 135–145)

## 2016-07-08 LAB — LIPID PANEL
Cholesterol: 127 mg/dL (ref 0–200)
HDL: 35 mg/dL — ABNORMAL LOW (ref >40)
LDL Cholesterol: 72 mg/dL (ref 0–99)
Total CHOL/HDL Ratio: 3.6 RATIO
Triglycerides: 99 mg/dL (ref <150)
VLDL: 20 mg/dL (ref 0–40)

## 2016-07-08 MED ORDER — AMLODIPINE BESYLATE 5 MG PO TABS
5.0000 mg | ORAL_TABLET | Freq: Every day | ORAL | Status: DC
  Administered 2016-07-08: 5 mg via ORAL
  Filled 2016-07-08: qty 1

## 2016-07-08 MED ORDER — AMLODIPINE BESYLATE 5 MG PO TABS: 5.0000 mg | ORAL_TABLET | Freq: Every day | ORAL | Status: DC

## 2016-07-08 MED ORDER — ACETAMINOPHEN 325 MG PO TABS: 650.0000 mg | ORAL_TABLET | ORAL | Status: DC | PRN

## 2016-07-08 MED FILL — ?AMLODIPINE BESYLATE 5 MG T: 5 | 30 days supply | Qty: 30 | Fill #0

## 2016-07-08 NOTE — Progress Notes (Signed)
Patient ID: Brad [name], male   DOB: 13-May-1961, 55 y.o.   MRN: [id number] S:Feels well and is aware of cath results O:BP 159/93 mmHg  Pulse 73  Temp(Src) 98.2 F (36.8 C) (Oral)  Resp 18  Ht 5\' 11"  (1.803 m)  Wt 93.94 kg (207 lb 1.6 oz)  BMI 28.90 kg/m2  SpO2 96%  Intake/Output Summary (Last 24 hours) at 07/08/16 0813 Last data filed at 07/07/16 1840  Gross per 24 hour  Intake  521.4 ml  Output    425 ml  Net   96.4 ml   Intake/Output: I/O last 3 completed shifts: In: 997.4 [P.O.:716; I.V.:281.4] Out: 425 [Urine:425]  Intake/Output this shift:    Weight change: 0.181 kg (6.4 oz) Gen:WD WN AAM in NAD CVS:no rub Resp:cta [id number] Ext:no edema   Recent Labs Lab 07/02/16 1019 07/03/16 0459 07/04/16 0151 07/05/16 0321 07/06/16 0305 07/07/16 0316 07/08/16 0533  NA 131* 132* 132* 137 134* 133* 135  K 3.8 3.9 3.5 4.1 3.2* 3.3* 3.5  CL 96* 99* 98* 102 99* 98* 101  CO2 26 23 25 23 27 29 25   GLUCOSE 258* 116* 159* 168* 158* 141* 89  BUN 31* 42* 41* 30* 22* 22* 21*  CREATININE 2.42* 2.65* 2.39* 1.84* 1.51* 1.49* 1.49*  ALBUMIN 3.4*  --   --  3.1* 3.1* 3.1* 3.3*  CALCIUM 8.6* 8.4* 8.5* 8.9 8.6* 8.6* 9.1  PHOS 3.1  --   --  2.3* 2.6 2.5 3.3   Liver Function Tests:  Recent Labs Lab 07/06/16 0305 07/07/16 0316 07/08/16 0533  ALBUMIN 3.1* 3.1* 3.3*   No results for input(s): LIPASE, AMYLASE in the last 168 hours. No results for input(s): AMMONIA in the last 168 hours. CBC:  Recent Labs Lab 07/01/16 1216 07/01/16 1838 07/04/16 0151 07/05/16 0321 07/06/16 0536  WBC 8.7 7.5 5.0 5.0 6.1  HGB 16.4 15.7 13.4 12.5* 13.9  HCT 46.1 45.0 40.4 36.6* 40.2  MCV 94.3 94.9 96.0 96.1 94.8  PLT 181 171 143* 134* 153   Cardiac Enzymes:  Recent Labs Lab 07/02/16 0102 07/02/16 0555 07/06/16 1624 07/06/16 2116 07/07/16 0316  TROPONINI 0.08* 0.09* 0.08* 0.08* 0.08*   CBG:  Recent Labs Lab 07/07/16 1138 07/07/16 1652 07/07/16 2140 07/08/16 0107  07/08/16 0605  GLUCAP 91 82 74 156* 96    Iron Studies: No results for input(s): IRON, TIBC, TRANSFERRIN, FERRITIN in the last 72 hours. Studies/Results: No results found. Marland Kitchen amitriptyline  75 mg Oral QHS  . aspirin  81 mg Oral Daily  . atorvastatin  10 mg Oral Daily  . cloNIDine  0.1 mg Oral BID  . enoxaparin (LOVENOX) injection  40 mg Subcutaneous Q24H  . gabapentin  300 mg Oral TID  . hydrALAZINE  25 mg Oral TID  . insulin aspart  0-15 Units Subcutaneous TID WC  . insulin aspart  0-5 Units Subcutaneous QHS  . insulin aspart  4 Units Subcutaneous TID WC  . insulin glargine  40 Units Subcutaneous BID  . isosorbide mononitrate  30 mg Oral Daily  . pantoprazole  40 mg Oral Daily  . sertraline  100 mg Oral Daily  . sodium chloride flush  3 mL Intravenous Q12H  . triamcinolone ointment   Topical Daily    BMET    Component Value Date/Time   NA 135 07/08/2016 0533   K 3.5 07/08/2016 0533   CL 101 07/08/2016 0533   CO2 25 07/08/2016 0533   GLUCOSE 89 07/08/2016 0533   BUN  21* 07/08/2016 0533   CREATININE 1.49* 07/08/2016 0533   CREATININE 1.75* 01/04/2016 0929   CALCIUM 9.1 07/08/2016 0533   GFRNONAA 51* 07/08/2016 0533   GFRNONAA 55* 11/21/2015 1249   GFRAA 59* 07/08/2016 0533   GFRAA 64 11/21/2015 1249   CBC    Component Value Date/Time   WBC 6.1 07/06/2016 0536   RBC 4.24 07/06/2016 0536   HGB 13.9 07/06/2016 0536   HCT 40.2 07/06/2016 0536   PLT 153 07/06/2016 0536   MCV 94.8 07/06/2016 0536   MCH 32.8 07/06/2016 0536   MCHC 34.6 07/06/2016 0536   RDW 11.3* 07/06/2016 0536   LYMPHSABS 2.1 06/26/2016 0336   MONOABS 0.7 06/26/2016 0336   EOSABS 0.1 06/26/2016 0336   BASOSABS 0.0 06/26/2016 0336     25M with AoCKD, chest pain with depressed LEVF and RMWA, HTN, DM2, chronic HCV, rash on legs  Assessment/Plan:  1. AKI/CKD stage 3- in setting of systolic CHF and NSAIDs 1. Normal complements and only minimal proteinuria, no hematuria or pyuria 2. Scr  stable after cardiac cath 3. Avoid NSAIDs and cocaine 4. Follow up with PCP as an outpatient 2. CKD stage 3 presumably due to DM and HTN +/- cocaine 1. Discussed importance of control of DM and HTN as well as abstinence from crack cocaine 2. ACE/ARB on hold due to AKI/CKD. 3. SSCP and systolic CHF- for left heart catheterization without significant disease. 4. Hypokalemia- ok to replete with 40 mEq KCl po and follow. 5. Hep C- per primary svc 6. HTN- need better control, per cardiology 7. History of polysubstance abuse- positive for cocaine and possible cause of SSCP 8. DM- per primary svc 9. Rash on legs- Derm to evaluate, doubt HSP since he has no hematuria. 10. Disposition- Nothing further to add.  Will sign off.  Patient should follow up with his PCP to maximize BP and diabetes control and abstain from cocaine.  PCP can refer to our practice if his CKD progresses despite adequate disease management.   [place]

## 2016-07-08 NOTE — Clinical Social Work Note (Signed)
Clinical Social Work Assessment  Patient Details  Name: Brad [name] MRN: [id number] Date of Birth: Nov 19, 1961  Date of referral:  07/08/16               Reason for consult:  Substance Use/ETOH Abuse                Permission sought to share information with:    Permission granted to share information::  No  Name::        Agency::     Relationship::     Contact Information:     Housing/Transportation Living arrangements for the past 2 months:  Apartment Source of Information:  Patient Patient Interpreter Needed:  None Criminal Activity/Legal Involvement Pertinent to Current Situation/Hospitalization:  No - Comment as needed Significant Relationships:  Friend Lives with:  Self Do you feel safe going back to the place where you live?  Yes Need for family participation in patient care:  Yes (Comment)  Care giving concerns:  The patient does not have any care giving concerns.   Social Worker assessment / plan:  CSW met with patient at bedside per unit CSW's request as the patient has active substance use concerns. The patient's substance abuse history is significant for heroin use, alcohol use, and cocaine use disorders. The patient states that he mainly uses cocaine now and drinks "sometimes." The patient is very limited in the information he gives and is difficult to get him to elaborate on his substance use. The patient does state that he used heroin for about 9 months but does not use heroin at this time. The patient states that he has been to treatment in the past when he lived in [place] and believes this did help him. CSW reviewed SA resources with patient and completed SBIRT with patient. The patient plans to utilizes resources that were provided. CSW signing off at this time.   Employment status:  Unemployed Forensic scientist:  Self Pay (Medicaid Pending) PT Recommendations:  Not assessed at this time Information / Referral to community resources:   (NA meeting  schedule also provided.)  Patient/Family's Response to care:  The patient appears happy with the care he has received.  Patient/Family's Understanding of and Emotional Response to Diagnosis, Current Treatment, and Prognosis:  The patient appears to have a good understanding of the reason for his admission and his post DC needs. It's difficult to gauge how motivated this patient is to stop using given his limited engagement in assessment.   Emotional Assessment Appearance:  Appears stated age Attitude/Demeanor/Rapport:  Guarded Affect (typically observed):  Flat, Guarded, Quiet Orientation:  Oriented to Self, Oriented to Place, Oriented to  Time, Oriented to Situation Alcohol / Substance use:  Illicit Drugs Psych involvement (Current and /or in the community):  No (Comment)  Discharge Needs  Concerns to be addressed:  Discharge Planning Concerns Readmission within the last 30 days:  No Current discharge risk:  Substance Abuse Barriers to Discharge:  No Barriers Identified   Brad Noel, LCSW 07/08/2016, 12:55 PM

## 2016-07-08 NOTE — Progress Notes (Signed)
Patient is alert and oriented, discharge instructions reviewed with patient, patient to follow up with community health and wellness, questions and concerns answered Neta Mends RN 2:18 PM  07-08-2016

## 2016-07-08 NOTE — Progress Notes (Signed)
Subjective: Pt resting comfortably  NO SOB Still with R sided shoulder pain  Objective: Filed Vitals:   07/07/16 1600 07/07/16 2023 07/08/16 0006 07/08/16 0523  BP: 150/98 148/100 143/85 159/93  Pulse: 80 82 65 73  Temp:  98.3 F (36.8 C) 97.7 F (36.5 C) 98.2 F (36.8 C)  TempSrc:  Oral Oral Oral  Resp:   18 18  Height:      Weight:    207 lb 1.6 oz (93.94 kg)  SpO2:  95% 96% 96%   Weight change: 6.4 oz (0.181 kg)  Intake/Output Summary (Last 24 hours) at 07/08/16 0747 Last data filed at 07/07/16 1840  Gross per 24 hour  Intake  521.4 ml  Output    425 ml  Net   96.4 ml    General: Alert, awake, oriented x3, in no acute distress Neck:  JVP is normal Heart: Regular rate and rhythm, without murmurs, rubs, gallops.  Lungs: Clear to auscultation.  No rales or wheezes. Exemities:  No edema.   Neuro: Grossly intact, nonfocal.  Tele:  SR   Lab Results: Results for orders placed or performed during the hospital encounter of 07/01/16 (from the past 24 hour(s))  Glucose, capillary     Status: Abnormal   Collection Time: 07/07/16  8:07 AM  Result Value Ref Range   Glucose-Capillary 122 (H) 65 - 99 mg/dL  Glucose, capillary     Status: None   Collection Time: 07/07/16 11:38 AM  Result Value Ref Range   Glucose-Capillary 91 65 - 99 mg/dL  Glucose, capillary     Status: None   Collection Time: 07/07/16  4:52 PM  Result Value Ref Range   Glucose-Capillary 82 65 - 99 mg/dL  Glucose, capillary     Status: None   Collection Time: 07/07/16  9:40 PM  Result Value Ref Range   Glucose-Capillary 74 65 - 99 mg/dL  Glucose, capillary     Status: Abnormal   Collection Time: 07/08/16  1:07 AM  Result Value Ref Range   Glucose-Capillary 156 (H) 65 - 99 mg/dL  Renal function panel     Status: Abnormal   Collection Time: 07/08/16  5:33 AM  Result Value Ref Range   Sodium 135 135 - 145 mmol/L   Potassium 3.5 3.5 - 5.1 mmol/L   Chloride 101 101 - 111 mmol/L   CO2 25 22 - 32 mmol/L    Glucose, Bld 89 65 - 99 mg/dL   BUN 21 (H) 6 - 20 mg/dL   Creatinine, Ser 1.49 (H) 0.61 - 1.24 mg/dL   Calcium 9.1 8.9 - 10.3 mg/dL   Phosphorus 3.3 2.5 - 4.6 mg/dL   Albumin 3.3 (L) 3.5 - 5.0 g/dL   GFR calc non Af Amer 51 (L) >60 mL/min   GFR calc Af Amer 59 (L) >60 mL/min   Anion gap 9 5 - 15  Glucose, capillary     Status: None   Collection Time: 07/08/16  6:05 AM  Result Value Ref Range   Glucose-Capillary 96 65 - 99 mg/dL    Studies/Results: No results found.  Medications: Reviewed  @PROBHOSP @  1  CP   Cath yesterday showed mild CAD  Mildly elevated LVEDP at 10   Plan to continue medical Rx  Not a cause fo R sided symptoms   2.  HTN  BP is still up  From pt compliance I would recomm stopping Imdur and add amlodipine 5 mig  Continues on Hydralazine and clonidine  Again cncern for compliance with tid regimen  3  Renal  Renal service following  Cr stable today   4  Lipids  Checked this AM  Should control given mild plaquing at cath  5  Cocaine    LOS: 5 days   Dorris Carnes 07/08/2016, 7:47 AM

## 2016-07-08 NOTE — Clinical Social Work Note (Signed)
CSW met with patient. Provided bus pass for the trip home. CSW spoke with financial counseling Langley Gauss) about financial assistance through [facility]. His previous financial assistance expired in June. She will mail him information for new application in the next 1-2 days. Patient expressed understanding.  CSW signing off as social work intervention is no longer needed. Patient discharged.  Dayton Scrape, [place]

## 2016-07-08 NOTE — Hospital Discharge Follow-Up (Signed)
Transitional Care Clinic Care Coordination Note:  Admit date:  07/01/2016 Discharge date: 07/08/2016 Discharge Disposition: home Patient contact: # [phone] Emergency contact(s): Mr Clement Husbands ( friend - works at [employer] ) # [phone]  This Case Manager reviewed patient's EMR and determined patient would benefit from post-discharge medical management and chronic care management services through the [facility]. Patient has a history of non ischemic cardiomyopathy , EF: 50% , HTN, dyslipidemia IDDM, combined chronic systolic and diastolic heart failure, history of heroin/drug abuse. He has 4 hospital admissions in the past year and is currently admitted with chest pain. This Case Manager met with patient to discuss the services and medical management that can be provided at the [facility]. Patient verbalized understanding and agreed to receive post-discharge care at the [facility].   Patient scheduled for Transitional Care appointment on 07/15/16 @ 1015.  Clinic information and appointment time provided to patient. Appointment information also placed on AVS.  Assessment:       Home Environment: lives alone in a first floor apartment but he said he is behind on the rent and is " getting ready to loose " the apartment. He said that he had been working until about 2 months ago and he also shared that he was  released from prison 10/2015 and is on probation.        Support System: He said that he is separated from his wife but they still communicate and see each other. He noted that he hopes that they could get back together but they could not live together because he does not get along with his step son.        Level of functioning: independent       Home DME: none       Home care services: (services arranged prior to discharge or new services after discharge) none       Transportation: uses the bus.  The SW gave him a bus pass for transportation home.         Food/Nutrition: (ability to afford, access, use of any community resources) He does not receive food stamps and said that he has difficulty obtaining food. Provided him with the brochures listing food pantries in [place] as well as the sites for free meals in [place].         Medications: (ability to afford, access, compliance, Pharmacy used)  He said that he uses the [facility] and his medication charges have been put on an account. He said that he needs to provide the pharmacy with documentation that is required that notes he is homeless or about to be homeless in order to obtain some of the medications. He said that he just picked up medications at the pharmacy before he was hospitalized. He noted that he will need to stop a the [facility] pharmacy on his way home to pick up any new medications.         Identified Barriers: no insurance, lack of support at home, pending homelessness, no job, denied disability, lack of transportation, no PCP, ? Compliance with medication regime and health maintenance, history or substance abuse        PCP (Name, office location, phone number): His PCP was Chari Manning, NP at [facility] but she is no longer at the clinic. He can establish care at [facility] after completing the 30 day transitional care episode,   Patient Education:Discussed the need to schedule an appointment with the [facility] Financial Counselor to apply  for the [place]. The patient's [employer] Card expired in June 2017. He said that he recently met with a [facility] financial counselor but cold not remember her name. He said that he is appealing his disability rejection.  Also discussed the need to go to the [employer] [facility]) as soon as possible to address his housing situation.  He said that he is familiar with the [facility] services. Encouraged him to try to go to there tomorrow,  Informed him that the [facility] could provide transportation for him to the clinic appointment next  week but his address and appointment time will need to be confirmed with him the day prior to the appointment and he stated that he understood.             Arranged services:        Jonnie Finner, RN CM notified that the patient would be assessed for the [facility].

## 2016-07-08 NOTE — Discharge Instructions (Signed)

## 2016-07-09 ENCOUNTER — Telehealth: Payer: Self-pay

## 2016-07-09 NOTE — Telephone Encounter (Signed)
Transitional Care Clinic Post-discharge Follow-Up Phone Call:  Date of Discharge: 07/08/16 Principal Discharge Diagnosis(es): Chest pain with elevated troponin Post-discharge Communication: Attempt #1 to reach patient and complete post-discharge follow-up phone call.  Call placed to [phone]; unable to reach patient. HIPPA compliant voicemail left requesting return call. In addition, call placed to patient's emergency contact, Madalyn Rob [phone], and HIPPA compliant message left requesting patient return call to this Case Manager. Awaiting return call from patient. Call completed: No

## 2016-07-10 ENCOUNTER — Telehealth: Payer: Self-pay

## 2016-07-10 NOTE — Telephone Encounter (Signed)
Transitional Care Clinic Post-discharge Follow-Up Phone Call:  Date of Discharge: 07/08/16 Principal Discharge Diagnosis(es): Chest pain with elevated troponin Post-discharge Communication: Call placed to [phone] and discharge follow-up phone call completed. Call Completed: Yes                    With Whom: Patient   Please check all that apply:  X  Patient is knowledgeable of his/her condition(s) and/or treatment. X  Patient is caring for self at home.  ? Patient is receiving assist at home from family and/or caregiver. Family and/or caregiver is knowledgeable of patient's condition(s) and/or treatment. ? Patient is receiving home health services. If so, name of agency.     Medication Reconciliation:  ? Medication list reviewed with patient. X  Patient obtained all discharge medications-Yes. Discharge summary not yet available. However, patient did pick up medications prescribed at discharge.  Attempted to review patient's entire medication list; however, patient indicated he did not need to review his medication list. He indicated he had all of his prescribed medications and was taking them as prescribed. Inquired if patient checking blood glucose, and patient indicated he was checking his blood glucose three times daily. Inquired about blood glucose readings, and patient indicated they were "doing fine." He indicated his last blood glucose reading was "170," and he indicated his blood glucose is usually in the 170's. Instructed patient to begin keeping a blood glucose log and recording blood sugar results. Instructed patient to bring log to upcoming Transitional Care Clinic appointment on 07/15/16 with Dr. Jarold Song. Patient verbalized understanding.   Activities of Daily Living:  X  Independent ? Needs assist  ? Total Care    Community resources in place for patient:  X  None  ? Home Health/Home DME ? Assisted Living ? Support Group         Questions/Concerns discussed: This  Case Manager inquired about patient's status. Patient indicated he was doing "alright right now," but indicated he was under a great deal of stress as he may lose his apartment. Reminded patient to go to [employer] to address his housing situation. Patient asked if he was experiencing chest pain. Patient indicated he had experienced a "little" chest pain but then added "but it's okay."  Patient informed that if he experiences chest pain again he should get to ED as soon as possible for evaluation. Stressed importance of evaluation of any further chest pain, and patient verbalized understanding.  Patient reminded of his upcoming [facility] appointment on 07/15/16 at 1015. He indicated he needed transportation to his appointment. Address confirmed, and Clinic Scheduler notified of patient's transportation need.  Patient had a walk-in appointment with Financial Counselor on 07/01/16. This Case Manager reminded patient to gather needed documents so his Financial Assistance application can be processed. Patient indicated he lost information that was given to him at his walk-in appointment. Spoke with Shelda Jakes, Financial Counselor, to determine what documents patient needed for application to be processed. She indicated this information is not documented since patient was a walk-in so she will need to meet with patient again. She indicated patient should ask to meet with her after his appointment with Dr. Jarold Song on 07/15/16 at 1015. Call placed to patient to provide update; however, unable to reach patient. Also unable to leave voicemail.

## 2016-07-11 ENCOUNTER — Telehealth: Payer: Self-pay

## 2016-07-11 NOTE — Telephone Encounter (Signed)
This Case Manager placed call to patient to provide update after speaking with Financial Counselor on 07/10/16. Patient informed this Case Manager that he lost Financial information that was given to him at his walk-in appointment with Financial Counselor on 07/01/16. Shelda Jakes, Financial Counselor indicated the documents patient needs to complete application was not documented since patient was a walk-in; therefore, patient should ask to speak with her when he comes to his [facility] appointment on 07/15/16.  Call placed to [phone]; however, unable to reach patient. HIPPA compliant voicemail left requesting return call.

## 2016-07-14 ENCOUNTER — Telehealth: Payer: Self-pay

## 2016-07-14 NOTE — Telephone Encounter (Signed)
Attempted to contact the patient to check on his status and to remind him of his appointment at the [facility] tomorrow, 07/15/16 @ 1015. Call placed to # [phone] (M) and a HIPAA compliant voicemail message was left requesting a call back to # [phone] or [phone].

## 2016-07-15 ENCOUNTER — Ambulatory Visit: Payer: Self-pay | Attending: Family Medicine | Admitting: Family Medicine

## 2016-07-15 ENCOUNTER — Encounter: Payer: Self-pay | Admitting: Family Medicine

## 2016-07-15 VITALS — BP 139/87 | HR 87 | Temp 97.4°F | Ht 71.0 in | Wt 208.8 lb

## 2016-07-15 DIAGNOSIS — F1911 Other psychoactive substance abuse, in remission: Secondary | ICD-10-CM

## 2016-07-15 DIAGNOSIS — K219 Gastro-esophageal reflux disease without esophagitis: Secondary | ICD-10-CM | POA: Insufficient documentation

## 2016-07-15 DIAGNOSIS — IMO0002 Reserved for concepts with insufficient information to code with codable children: Secondary | ICD-10-CM

## 2016-07-15 DIAGNOSIS — E1165 Type 2 diabetes mellitus with hyperglycemia: Secondary | ICD-10-CM | POA: Insufficient documentation

## 2016-07-15 DIAGNOSIS — Z9889 Other specified postprocedural states: Secondary | ICD-10-CM | POA: Insufficient documentation

## 2016-07-15 DIAGNOSIS — N183 Chronic kidney disease, stage 3 unspecified: Secondary | ICD-10-CM

## 2016-07-15 DIAGNOSIS — Z794 Long term (current) use of insulin: Secondary | ICD-10-CM | POA: Insufficient documentation

## 2016-07-15 DIAGNOSIS — F1990 Other psychoactive substance use, unspecified, uncomplicated: Secondary | ICD-10-CM

## 2016-07-15 DIAGNOSIS — E1122 Type 2 diabetes mellitus with diabetic chronic kidney disease: Secondary | ICD-10-CM | POA: Insufficient documentation

## 2016-07-15 DIAGNOSIS — I5042 Chronic combined systolic (congestive) and diastolic (congestive) heart failure: Secondary | ICD-10-CM

## 2016-07-15 DIAGNOSIS — E1121 Type 2 diabetes mellitus with diabetic nephropathy: Secondary | ICD-10-CM | POA: Insufficient documentation

## 2016-07-15 DIAGNOSIS — M542 Cervicalgia: Secondary | ICD-10-CM

## 2016-07-15 DIAGNOSIS — Z79899 Other long term (current) drug therapy: Secondary | ICD-10-CM | POA: Insufficient documentation

## 2016-07-15 DIAGNOSIS — Z87898 Personal history of other specified conditions: Secondary | ICD-10-CM | POA: Insufficient documentation

## 2016-07-15 LAB — GLUCOSE, POCT (MANUAL RESULT ENTRY)
POC Glucose: 326 mg/dl — AB (ref 70–99)
POC Glucose: 305 mg/dl — AB (ref 70–99)

## 2016-07-15 MED ORDER — INSULIN GLARGINE 100 UNIT/ML ~~LOC~~ SOLN: 55.0000 [IU] | Freq: Two times a day (BID) | SUBCUTANEOUS | 3 refills | Status: DC

## 2016-07-15 MED ORDER — TRAMADOL HCL 50 MG PO TABS: 50.0000 mg | ORAL_TABLET | Freq: Two times a day (BID) | ORAL | 0 refills | Status: DC | PRN

## 2016-07-15 MED ORDER — INSULIN ASPART 100 UNIT/ML ~~LOC~~ SOLN: 6.0000 [IU] | Freq: Once | SUBCUTANEOUS | Status: DC
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
## 2016-07-15 NOTE — Patient Instructions (Signed)
Diabetes Mellitus and Food It is important for you to manage your blood sugar (glucose) level. Your blood glucose level can be greatly affected by what you eat. Eating healthier foods in the appropriate amounts throughout the day at about the same time each day will help you control your blood glucose level. It can also help slow or prevent worsening of your diabetes mellitus. Healthy eating may even help you improve the level of your blood pressure and reach or maintain a healthy weight.  General recommendations for healthful eating and cooking habits include:  Eating meals and snacks regularly. Avoid going long periods of time without eating to lose weight.  Eating a diet that consists mainly of plant-based foods, such as fruits, vegetables, nuts, legumes, and whole grains.  Using low-heat cooking methods, such as baking, instead of high-heat cooking methods, such as deep frying. Work with your dietitian to make sure you understand how to use the Nutrition Facts information on food labels. HOW CAN FOOD AFFECT ME? Carbohydrates Carbohydrates affect your blood glucose level more than any other type of food. Your dietitian will help you determine how many carbohydrates to eat at each meal and teach you how to count carbohydrates. Counting carbohydrates is important to keep your blood glucose at a healthy level, especially if you are using insulin or taking certain medicines for diabetes mellitus. Alcohol Alcohol can cause sudden decreases in blood glucose (hypoglycemia), especially if you use insulin or take certain medicines for diabetes mellitus. Hypoglycemia can be a life-threatening condition. Symptoms of hypoglycemia (sleepiness, dizziness, and disorientation) are similar to symptoms of having too much alcohol.  If your health care provider has given you approval to drink alcohol, do so in moderation and use the following guidelines:  Women should not have more than one drink per day, and men  should not have more than two drinks per day. One drink is equal to:  12 oz of beer.  5 oz of wine.  1 oz of hard liquor.  Do not drink on an empty stomach.  Keep yourself hydrated. Have water, diet soda, or unsweetened iced tea.  Regular soda, juice, and other mixers might contain a lot of carbohydrates and should be counted. WHAT FOODS ARE NOT RECOMMENDED? As you make food choices, it is important to remember that all foods are not the same. Some foods have fewer nutrients per serving than other foods, even though they might have the same number of calories or carbohydrates. It is difficult to get your body what it needs when you eat foods with fewer nutrients. Examples of foods that you should avoid that are high in calories and carbohydrates but low in nutrients include:  Trans fats (most processed foods list trans fats on the Nutrition Facts label).  Regular soda.  Juice.  Candy.  Sweets, such as cake, pie, doughnuts, and cookies.  Fried foods. WHAT FOODS CAN I EAT? Eat nutrient-rich foods, which will nourish your body and keep you healthy. The food you should eat also will depend on several factors, including:  The calories you need.  The medicines you take.  Your weight.  Your blood glucose level.  Your blood pressure level.  Your cholesterol level. You should eat a variety of foods, including:  Protein.  Lean cuts of meat.  Proteins low in saturated fats, such as fish, egg whites, and beans. Avoid processed meats.  Fruits and vegetables.  Fruits and vegetables that may help control blood glucose levels, such as apples, mangoes, and   yams.  Dairy products.  Choose fat-free or low-fat dairy products, such as milk, yogurt, and cheese.  Grains, bread, pasta, and rice.  Choose whole grain products, such as multigrain bread, whole oats, and brown rice. These foods may help control blood pressure.  Fats.  Foods containing healthful fats, such as nuts,  avocado, olive oil, canola oil, and fish. DOES EVERYONE WITH DIABETES MELLITUS HAVE THE SAME MEAL PLAN? Because every person with diabetes mellitus is different, there is not one meal plan that works for everyone. It is very important that you meet with a dietitian who will help you create a meal plan that is just right for you.   This information is not intended to replace advice given to you by your health care provider. Make sure you discuss any questions you have with your health care provider.   Document Released: 09/04/2005 Document Revised: 12/29/2014 Document Reviewed: 11/04/2013 Elsevier Interactive Patient Education 2016 Elsevier Inc.  

## 2016-07-16 ENCOUNTER — Inpatient Hospital Stay: Payer: Self-pay | Admitting: Critical Care Medicine

## 2016-07-21 ENCOUNTER — Telehealth: Payer: Self-pay

## 2016-07-21 NOTE — Telephone Encounter (Signed)
Call received from the patient stating that his appointment with the behavioral health doctor for tomorrow at 0900 was just cancelled and he would like to have his appointment back for tomorrow, 07/22/16 @ 1030.  The appointment was rescheduled and cab transportation has been arranged by Jacklynn Lewis, [facility] scheduler ,to pick him up at 0930  He was very appreciative of the transportation.   Instructed him to bring all of his medications with him to the appointment for Dr Jarold Song to review with him and he stated that he would.

## 2016-07-21 NOTE — Telephone Encounter (Signed)
Attempted to contact the patient to remind him of his appointment at the [facility] tomorrow, 07/22/16 @1030 . Call placed to # [phone] and a HIPAA compliant voicemail message was left requesting a call back to # [phone] or [phone].  Jacklynn Lewis, [place] notified that if the patient calls the clinic and needs to transportation to his appointment tomorrow, a cab can be arranged for him.

## 2016-07-21 NOTE — Telephone Encounter (Signed)
Call placed to the patient to confirm his appointment at the [facility] tomorrow, 07/22/16 @ 1030. He stated that he has an appointment with a behavioral health doctor regarding his disability tomorrow at 0900 and will need to reschedule his appointment. He noted that he needs to take the bus to the appointment and will not be able to get to the [facility] in time for the 1030 appointment.  He requested that this CM call him back with the new appointment day/time as he is going into a grocery store.  He said that he has all of his medications but one he was not sure which one but said that he doesn't have $4.00 for the co-pay. Informed him that the pharmacy might allow him to put it on an account and he can pay at a later time   He said that he has been checking his blood sugars and they have ranged from 145-170.  He said his housing is " all right " for now but is still worried about being homeless as the apartment that he is in is too expensive.  He said that he is worried about where he will go and stated that he knows that he needs to go to the [facility] and may possibly go tomorrow after his appointment at 0900.  No other concerns/questions reported at this time.   An appointment was scheduled for 07/29/16 @ 1000 and a call was placed to inform the patient of the appointment. Call placed to # [phone] (M) and a HIPAA compliant  voicemail message was left requesting a call back to # [phone] or [phone].

## 2016-07-22 ENCOUNTER — Telehealth: Payer: Self-pay

## 2016-07-22 ENCOUNTER — Ambulatory Visit: Payer: Medicaid Other | Attending: Family Medicine | Admitting: Family Medicine

## 2016-07-22 ENCOUNTER — Encounter: Payer: Self-pay | Admitting: Family Medicine

## 2016-07-22 ENCOUNTER — Ambulatory Visit: Payer: Self-pay | Admitting: Family Medicine

## 2016-07-22 VITALS — BP 117/84 | HR 74 | Temp 98.5°F | Ht 71.0 in | Wt 206.4 lb

## 2016-07-22 DIAGNOSIS — Z794 Long term (current) use of insulin: Secondary | ICD-10-CM | POA: Diagnosis not present

## 2016-07-22 DIAGNOSIS — E1121 Type 2 diabetes mellitus with diabetic nephropathy: Secondary | ICD-10-CM | POA: Diagnosis not present

## 2016-07-22 DIAGNOSIS — E1165 Type 2 diabetes mellitus with hyperglycemia: Secondary | ICD-10-CM | POA: Diagnosis not present

## 2016-07-22 DIAGNOSIS — M546 Pain in thoracic spine: Secondary | ICD-10-CM | POA: Diagnosis not present

## 2016-07-22 DIAGNOSIS — I110 Hypertensive heart disease with heart failure: Secondary | ICD-10-CM | POA: Diagnosis not present

## 2016-07-22 DIAGNOSIS — N183 Chronic kidney disease, stage 3 unspecified: Secondary | ICD-10-CM

## 2016-07-22 DIAGNOSIS — G4700 Insomnia, unspecified: Secondary | ICD-10-CM

## 2016-07-22 DIAGNOSIS — I5042 Chronic combined systolic (congestive) and diastolic (congestive) heart failure: Secondary | ICD-10-CM

## 2016-07-22 DIAGNOSIS — IMO0002 Reserved for concepts with insufficient information to code with codable children: Secondary | ICD-10-CM

## 2016-07-22 DIAGNOSIS — I10 Essential (primary) hypertension: Secondary | ICD-10-CM

## 2016-07-22 MED ORDER — METHOCARBAMOL 500 MG PO TABS
500.0000 mg | ORAL_TABLET | Freq: Three times a day (TID) | ORAL | 0 refills | Status: DC | PRN
Start: 2016-07-22 — End: 2016-08-01

## 2016-07-22 MED ORDER — GABAPENTIN 300 MG PO CAPS: 900.0000 mg | ORAL_CAPSULE | Freq: Two times a day (BID) | ORAL | 3 refills | Status: DC

## 2016-07-22 MED FILL — ULTICARE SYR 0.5 ML 29GX1/2: 29G X 1/2" | 50 days supply | Qty: 100 | Fill #0

## 2016-07-22 MED FILL — GABAPENTIN 300 MG CAPSULE: 300 | 30 days supply | Qty: 180 | Fill #0

## 2016-07-22 MED FILL — METHOCARBAMOL 500 MG TABLET: 500 | 30 days supply | Qty: 90 | Fill #0

## 2016-07-22 NOTE — Telephone Encounter (Signed)
Met with the patient when he was at his appointment at [facility] today. He explained that the doctor that he was supposed to see this morning for his disability application has rescheduled the appointment for next week.    He said that he still needs to go to [facility] to discuss his housing options. He noted that he is in pain and his feet hurt and he has difficulty getting to the appointments. Provided him with a bus pass as there was one left in the clinic.  He said that his wife has a car but has her own medical conditions to manage, including dialysis and has little time to help him and has no money to give him.  He also said that he needs to go to DSS to apply for food stamps. Encouraged him to go to [facility] and DSS as soon as possible.   He said that he has no money, not even $4.00 to pick up his tramadol. He said that he has no family, friends that can provide him any money. He is also in need of other medications from the pharmacy, including gabapentin and robaxin. As per Phillis Knack, [facility] Pharmacist, he does not need to pay for any of the medications except the tramadol. The other medications can be charged to an account that he can pay off when he is able.   He also noted that his phone no longer accepts calls as the bill needs to be paid. He said that there is no one else at this time that can be contacted when he needs to be reached. He said that his phone may accept text messages but his is not sure. He said that he does not qualify for an [employer] phone because he is not receiving food stamps or medicaid.   Offered him brochures listing free meals and food pantries in [place] but he said that he already has that information.
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
## 2016-07-22 NOTE — Progress Notes (Signed)
Fell in kitchen 5 days ago- left side Medication refills

## 2016-07-22 NOTE — Patient Instructions (Signed)

## 2016-07-24 ENCOUNTER — Emergency Department (HOSPITAL_COMMUNITY)
Admission: EM | Admit: 2016-07-24 | Discharge: 2016-07-24 | Disposition: A | Discharge status: Home / Self Care | Payer: Medicaid Other | Attending: Emergency Medicine | Admitting: Emergency Medicine

## 2016-07-24 ENCOUNTER — Encounter (HOSPITAL_COMMUNITY): Payer: Self-pay | Admitting: Emergency Medicine

## 2016-07-24 ENCOUNTER — Telehealth: Payer: Self-pay | Admitting: Internal Medicine

## 2016-07-24 ENCOUNTER — Emergency Department (HOSPITAL_COMMUNITY): Payer: Medicaid Other

## 2016-07-24 DIAGNOSIS — Z79899 Other long term (current) drug therapy: Secondary | ICD-10-CM | POA: Diagnosis not present

## 2016-07-24 DIAGNOSIS — Y939 Activity, unspecified: Secondary | ICD-10-CM | POA: Insufficient documentation

## 2016-07-24 DIAGNOSIS — S82891A Other fracture of right lower leg, initial encounter for closed fracture: Secondary | ICD-10-CM

## 2016-07-24 DIAGNOSIS — Z96652 Presence of left artificial knee joint: Secondary | ICD-10-CM | POA: Insufficient documentation

## 2016-07-24 DIAGNOSIS — Z7982 Long term (current) use of aspirin: Secondary | ICD-10-CM | POA: Diagnosis not present

## 2016-07-24 DIAGNOSIS — I130 Hypertensive heart and chronic kidney disease with heart failure and stage 1 through stage 4 chronic kidney disease, or unspecified chronic kidney disease: Secondary | ICD-10-CM | POA: Diagnosis not present

## 2016-07-24 DIAGNOSIS — W010XXA Fall on same level from slipping, tripping and stumbling without subsequent striking against object, initial encounter: Secondary | ICD-10-CM | POA: Insufficient documentation

## 2016-07-24 DIAGNOSIS — I5032 Chronic diastolic (congestive) heart failure: Secondary | ICD-10-CM | POA: Insufficient documentation

## 2016-07-24 DIAGNOSIS — Y929 Unspecified place or not applicable: Secondary | ICD-10-CM | POA: Insufficient documentation

## 2016-07-24 DIAGNOSIS — S99911A Unspecified injury of right ankle, initial encounter: Secondary | ICD-10-CM | POA: Diagnosis present

## 2016-07-24 DIAGNOSIS — S82851A Displaced trimalleolar fracture of right lower leg, initial encounter for closed fracture: Secondary | ICD-10-CM | POA: Diagnosis not present

## 2016-07-24 DIAGNOSIS — Z794 Long term (current) use of insulin: Secondary | ICD-10-CM | POA: Insufficient documentation

## 2016-07-24 DIAGNOSIS — N183 Chronic kidney disease, stage 3 (moderate): Secondary | ICD-10-CM | POA: Insufficient documentation

## 2016-07-24 DIAGNOSIS — E1140 Type 2 diabetes mellitus with diabetic neuropathy, unspecified: Secondary | ICD-10-CM | POA: Insufficient documentation

## 2016-07-24 DIAGNOSIS — F17210 Nicotine dependence, cigarettes, uncomplicated: Secondary | ICD-10-CM | POA: Diagnosis not present

## 2016-07-24 DIAGNOSIS — Y999 Unspecified external cause status: Secondary | ICD-10-CM | POA: Insufficient documentation

## 2016-07-24 MED ORDER — HYDROMORPHONE HCL 1 MG/ML IJ SOLN
1.0000 mg | Freq: Once | INTRAMUSCULAR | Status: AC
  Administered 2016-07-24: 1 mg via INTRAVENOUS
  Filled 2016-07-24: qty 1

## 2016-07-24 MED ORDER — ONDANSETRON HCL 4 MG/2ML IJ SOLN
4.0000 mg | Freq: Once | INTRAMUSCULAR | Status: AC
  Administered 2016-07-24: 4 mg via INTRAVENOUS
  Filled 2016-07-24: qty 2

## 2016-07-24 MED ORDER — KETAMINE HCL-SODIUM CHLORIDE 100-0.9 MG/10ML-% IV SOSY
0.3000 mg/kg | PREFILLED_SYRINGE | Freq: Once | INTRAVENOUS | Status: AC
  Administered 2016-07-24: 28 mg via INTRAVENOUS
  Filled 2016-07-24: qty 10

## 2016-07-24 MED ORDER — MORPHINE SULFATE (PF) 4 MG/ML IV SOLN
6.0000 mg | Freq: Once | INTRAVENOUS | Status: AC
  Administered 2016-07-24: 6 mg via INTRAVENOUS
  Filled 2016-07-24: qty 2

## 2016-07-24 MED FILL — traMADol HCL 50 MG TABS: 50 | 30 days supply | Qty: 60 | Fill #0

## 2016-07-24 NOTE — ED Triage Notes (Signed)
Pt tripped and fell this morning walking out his door , pt  Has a swollen right ankle with limited rom

## 2016-07-24 NOTE — Telephone Encounter (Signed)
This Case Manager placed return call to patient at [phone]; however, unable to reach patient. Unable to leave voicemail as mailbox full. This Case Manager placed call to patient in ED room. Patient indicated he "broke his ankle in three places" and is unable to afford $4.00 for his pain medication.  This Case Manager discussed with Environmental education officer, pharmacy at [facility] and [facility], and [place], ED CM. Unable to do [facility] program for narcotics.  Fuller Mandril, RN CM indicated she would speak with patient again in ED about medication needs.
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
## 2016-07-24 NOTE — ED Provider Notes (Signed)
Received patient in check out from Dr. Maryan Rued, patient with a trimalleolar fracture that was reduced earlier in the ED. I discussed this with the orthopedics on-call, Dr. Erlinda Hong.  Recommended CT scan of the ankle and then close follow-up within a week with them.   Deno Etienne, DO 07/24/16 2211

## 2016-07-24 NOTE — ED Notes (Signed)
Placed patient on the monitor and in a gown

## 2016-07-24 NOTE — Progress Notes (Signed)
Orthopedic Tech Progress Note Patient Details:  Brad [name] 1961/04/28 [id number]  Ortho Devices Type of Ortho Device: Ace wrap, Post (short leg) splint, Stirrup splint Ortho Device/Splint Interventions: Application   [place] Pink 07/24/2016, 1:54 PM

## 2016-07-24 NOTE — Discharge Planning (Signed)
Went to speak with pt concerning medication but pt was in x-ray.  Relayed information to pt bedside RN and oncoming EDCM that pt may go to [facility] pharmacy to pick up funds for Rx.

## 2016-07-24 NOTE — Care Management (Signed)
ED CM received consult in handoff, CM spoke with patient regarding arrangements made to pick up medications at the [facility] pharmacy today before 5:30p. Patient state he will send his SO to pick up medications.  CM spoke with Dr. Maryan Rued concerning disposition, she is recommending no weight bearing at this time, awaiting Ortho consult.  A rolling walker is also recommended. Referral called to [facility] ,rolling walker delivered to room prior to discharge.  Patient is being followed with Case Management at the [facility]. No further CM needs identified.

## 2016-07-24 NOTE — Discharge Instructions (Signed)
ELEVATE YOUR LEG AT ALL TIMES UNLESS EATING OR USING THE BATHROOM!

## 2016-07-24 NOTE — Telephone Encounter (Signed)
Pt calling to inform case manager that he is currently in the ED Pt has broken his ankle in 3 different spots Pt is requesting to speak with a case manager, did not disclose details

## 2016-07-25 ENCOUNTER — Telehealth: Payer: Self-pay

## 2016-07-25 NOTE — Telephone Encounter (Signed)
This Case Manager placed call to patient as patient in ED on 07/24/16 for trimalleolar fracture. Patient indicated he fell yesterday and "broke his ankle in three places."  Per ED CM note, patient is to remain non-weightbearing at this time. Patient indicated he was aware of non-weightbearing status and indicated he is using a walker for mobility. He also indicated he had an appointment on 07/28/16 at 1030 with Orthopedic provider. Inquired if patient was able to pick up his pain medication. He indicated he obtained his pain medication yesterday. Discussed follow-up appointment with Dr. Jarold Song, and patient indicated he did not want to schedule an appointment at this time. He indicated he wanted to determine Orthopedic recommendations prior to scheduling follow-up appointment with Dr. Jarold Song.  Discussed importance of medication compliance, and patient verbalized understanding and indicated he was being compliant with his medications. No additional needs identified at this time.

## 2016-07-28 ENCOUNTER — Other Ambulatory Visit: Payer: Self-pay | Admitting: Orthopaedic Surgery

## 2016-07-29 ENCOUNTER — Inpatient Hospital Stay: Payer: Self-pay | Admitting: Family Medicine

## 2016-07-29 ENCOUNTER — Encounter (HOSPITAL_COMMUNITY): Payer: Self-pay | Admitting: *Deleted
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
## 2016-07-29 NOTE — Progress Notes (Signed)
Spoke with pt for pre-op call. Pt frustrated, lashing out that he didn't have money for his pain medicine and he's hurting and he's getting ready to lose his apartment. He states he has run out of his Clonidine, but still has his other meds. Pt is diabetic, last A1C was 9.5 on 07/01/16. Pt states his fasting blood sugar this AM was 300. I asked if he had taken his Lantus last PM and yesterday AM, he stated yes and "I didn't eat anything all day". At the time of the call, pt had not taken his AM dose of Lantus. Pt states it's hard for him to get around with his broken ankle and he doesn't have anyone there to help. I instructed pt to take 1/2 of his regular dose of Lantus tonight and in the AM. Instructed pt to check blood sugar every 2 hours tomorrow prior to surgery.  If blood sugar is 70 or below, treat with 1/2 cup of clear juice (apple or cranberry) and recheck blood sugar 15 minutes after drinking juice. If blood sugar continues to be 70 or below, call the Short Stay department and ask to speak to a nurse. Pt voiced understanding. Chart is being reviewed by Durel Salts, NP and I gave her this info after talking with pt. She requested that I call Dr. Phoebe Sharps office and let him know of pt's blood sugar. I called Sherrie at Dr. Phoebe Sharps office and while I was on the phone she spoke with Dr. Erlinda Hong and I could hear him say "ok".

## 2016-07-30 ENCOUNTER — Ambulatory Visit (HOSPITAL_COMMUNITY): Payer: Medicaid Other | Admitting: Emergency Medicine

## 2016-07-30 ENCOUNTER — Ambulatory Visit (HOSPITAL_COMMUNITY): Payer: Medicaid Other

## 2016-07-30 ENCOUNTER — Inpatient Hospital Stay (HOSPITAL_COMMUNITY)
Admission: RE | Admit: 2016-07-30 | Discharge: 2016-08-01 | DRG: 493 | Disposition: A | Discharge status: Home / Self Care | Payer: Medicaid Other | Source: Ambulatory Visit | Attending: Family Medicine | Admitting: Family Medicine

## 2016-07-30 ENCOUNTER — Encounter (HOSPITAL_COMMUNITY)
Admission: RE | Disposition: A | Discharge status: Home / Self Care | Payer: Self-pay | Source: Ambulatory Visit | Attending: Orthopaedic Surgery

## 2016-07-30 DIAGNOSIS — N183 Chronic kidney disease, stage 3 unspecified: Secondary | ICD-10-CM | POA: Diagnosis present

## 2016-07-30 DIAGNOSIS — F17210 Nicotine dependence, cigarettes, uncomplicated: Secondary | ICD-10-CM | POA: Diagnosis present

## 2016-07-30 DIAGNOSIS — W19XXXA Unspecified fall, initial encounter: Secondary | ICD-10-CM | POA: Diagnosis present

## 2016-07-30 DIAGNOSIS — F1410 Cocaine abuse, uncomplicated: Secondary | ICD-10-CM

## 2016-07-30 DIAGNOSIS — S82891A Other fracture of right lower leg, initial encounter for closed fracture: Secondary | ICD-10-CM

## 2016-07-30 DIAGNOSIS — E118 Type 2 diabetes mellitus with unspecified complications: Secondary | ICD-10-CM

## 2016-07-30 DIAGNOSIS — I2510 Atherosclerotic heart disease of native coronary artery without angina pectoris: Secondary | ICD-10-CM | POA: Diagnosis not present

## 2016-07-30 DIAGNOSIS — F32A Depression, unspecified: Secondary | ICD-10-CM | POA: Diagnosis present

## 2016-07-30 DIAGNOSIS — S82851A Displaced trimalleolar fracture of right lower leg, initial encounter for closed fracture: Principal | ICD-10-CM | POA: Diagnosis present

## 2016-07-30 DIAGNOSIS — K219 Gastro-esophageal reflux disease without esophagitis: Secondary | ICD-10-CM | POA: Diagnosis present

## 2016-07-30 DIAGNOSIS — E1140 Type 2 diabetes mellitus with diabetic neuropathy, unspecified: Secondary | ICD-10-CM | POA: Diagnosis present

## 2016-07-30 DIAGNOSIS — E1165 Type 2 diabetes mellitus with hyperglycemia: Secondary | ICD-10-CM | POA: Diagnosis present

## 2016-07-30 DIAGNOSIS — E1122 Type 2 diabetes mellitus with diabetic chronic kidney disease: Secondary | ICD-10-CM | POA: Diagnosis present

## 2016-07-30 DIAGNOSIS — E1121 Type 2 diabetes mellitus with diabetic nephropathy: Secondary | ICD-10-CM | POA: Diagnosis present

## 2016-07-30 DIAGNOSIS — E871 Hypo-osmolality and hyponatremia: Secondary | ICD-10-CM | POA: Diagnosis not present

## 2016-07-30 DIAGNOSIS — Z794 Long term (current) use of insulin: Secondary | ICD-10-CM

## 2016-07-30 DIAGNOSIS — I5032 Chronic diastolic (congestive) heart failure: Secondary | ICD-10-CM | POA: Diagnosis present

## 2016-07-30 DIAGNOSIS — B1920 Unspecified viral hepatitis C without hepatic coma: Secondary | ICD-10-CM | POA: Diagnosis present

## 2016-07-30 DIAGNOSIS — E119 Type 2 diabetes mellitus without complications: Secondary | ICD-10-CM

## 2016-07-30 DIAGNOSIS — Z8781 Personal history of (healed) traumatic fracture: Secondary | ICD-10-CM

## 2016-07-30 DIAGNOSIS — Z7982 Long term (current) use of aspirin: Secondary | ICD-10-CM

## 2016-07-30 DIAGNOSIS — Z79899 Other long term (current) drug therapy: Secondary | ICD-10-CM

## 2016-07-30 DIAGNOSIS — I130 Hypertensive heart and chronic kidney disease with heart failure and stage 1 through stage 4 chronic kidney disease, or unspecified chronic kidney disease: Secondary | ICD-10-CM | POA: Diagnosis present

## 2016-07-30 DIAGNOSIS — Z419 Encounter for procedure for purposes other than remedying health state, unspecified: Secondary | ICD-10-CM

## 2016-07-30 DIAGNOSIS — F329 Major depressive disorder, single episode, unspecified: Secondary | ICD-10-CM | POA: Diagnosis present

## 2016-07-30 DIAGNOSIS — IMO0002 Reserved for concepts with insufficient information to code with codable children: Secondary | ICD-10-CM

## 2016-07-30 DIAGNOSIS — Z9889 Other specified postprocedural states: Secondary | ICD-10-CM

## 2016-07-30 DIAGNOSIS — Z96652 Presence of left artificial knee joint: Secondary | ICD-10-CM | POA: Diagnosis present

## 2016-07-30 DIAGNOSIS — Z8546 Personal history of malignant neoplasm of prostate: Secondary | ICD-10-CM

## 2016-07-30 HISTORY — DX: Chronic kidney disease, stage 3 (moderate): N18.3

## 2016-07-30 HISTORY — DX: Chronic kidney disease, stage 3 unspecified: N18.30

## 2016-07-30 HISTORY — DX: Depression, unspecified: F32.A

## 2016-07-30 HISTORY — DX: Malignant (primary) neoplasm, unspecified: C80.1

## 2016-07-30 HISTORY — DX: Polyneuropathy, unspecified: G62.9

## 2016-07-30 HISTORY — PX: ORIF ANKLE FRACTURE: SHX5408

## 2016-07-30 HISTORY — DX: Unspecified osteoarthritis, unspecified site: M19.90

## 2016-07-30 HISTORY — DX: Major depressive disorder, single episode, unspecified: F32.9

## 2016-07-30 LAB — COMPREHENSIVE METABOLIC PANEL
ALT: 48 U/L (ref 17–63)
ALT: 42 U/L (ref 17–63)
AST: 40 U/L (ref 15–41)
AST: 38 U/L (ref 15–41)
Albumin: 3.1 g/dL — ABNORMAL LOW (ref 3.5–5.0)
Albumin: 2.9 g/dL — ABNORMAL LOW (ref 3.5–5.0)
Alkaline Phosphatase: 65 U/L (ref 38–126)
Alkaline Phosphatase: 66 U/L (ref 38–126)
Anion gap: 8 (ref 5–15)
Anion gap: 8 (ref 5–15)
BUN: 11 mg/dL (ref 6–20)
BUN: 13 mg/dL (ref 6–20)
CO2: 26 mmol/L (ref 22–32)
CO2: 29 mmol/L (ref 22–32)
Calcium: 8.9 mg/dL (ref 8.9–10.3)
Calcium: 8.3 mg/dL — ABNORMAL LOW (ref 8.9–10.3)
Chloride: 95 mmol/L — ABNORMAL LOW (ref 101–111)
Chloride: 94 mmol/L — ABNORMAL LOW (ref 101–111)
Creatinine, Ser: 1.44 mg/dL — ABNORMAL HIGH (ref 0.61–1.24)
Creatinine, Ser: 1.46 mg/dL — ABNORMAL HIGH (ref 0.61–1.24)
GFR calc Af Amer: >60 mL/min (ref >60)
GFR calc Af Amer: >60 mL/min (ref >60)
GFR calc non Af Amer: 53 mL/min — ABNORMAL LOW (ref >60)
GFR calc non Af Amer: 52 mL/min — ABNORMAL LOW (ref >60)
Glucose, Bld: 275 mg/dL — ABNORMAL HIGH (ref 65–99)
Glucose, Bld: 267 mg/dL — ABNORMAL HIGH (ref 65–99)
Potassium: 3.8 mmol/L (ref 3.5–5.1)
Potassium: 3.5 mmol/L (ref 3.5–5.1)
Sodium: 129 mmol/L — ABNORMAL LOW (ref 135–145)
Sodium: 131 mmol/L — ABNORMAL LOW (ref 135–145)
Total Bilirubin: 0.7 mg/dL (ref 0.3–1.2)
Total Bilirubin: 0.7 mg/dL (ref 0.3–1.2)
Total Protein: 7.7 g/dL (ref 6.5–8.1)
Total Protein: 7.6 g/dL (ref 6.5–8.1)

## 2016-07-30 LAB — CBC
HCT: 33.1 % — ABNORMAL LOW (ref 39.0–52.0)
Hemoglobin: 11.4 g/dL — ABNORMAL LOW (ref 13.0–17.0)
MCH: 32.5 pg (ref 26.0–34.0)
MCHC: 34.4 g/dL (ref 30.0–36.0)
MCV: 94.3 fL (ref 78.0–100.0)
Platelets: 169 10*3/uL (ref 150–400)
RBC: 3.51 MIL/uL — ABNORMAL LOW (ref 4.22–5.81)
RDW: 12 % (ref 11.5–15.5)
WBC: 7.3 10*3/uL (ref 4.0–10.5)

## 2016-07-30 LAB — GLUCOSE, CAPILLARY
Glucose-Capillary: 329 mg/dL — ABNORMAL HIGH (ref 65–99)
Glucose-Capillary: 237 mg/dL — ABNORMAL HIGH (ref 65–99)
Glucose-Capillary: 166 mg/dL — ABNORMAL HIGH (ref 65–99)
Glucose-Capillary: 147 mg/dL — ABNORMAL HIGH (ref 65–99)
Glucose-Capillary: 239 mg/dL — ABNORMAL HIGH (ref 65–99)
Glucose-Capillary: 256 mg/dL — ABNORMAL HIGH (ref 65–99)

## 2016-07-30 SURGERY — OPEN REDUCTION INTERNAL FIXATION (ORIF) ANKLE FRACTURE
Anesthesia: Regional | Site: Ankle | Laterality: Right

## 2016-07-30 MED ORDER — 0.9 % SODIUM CHLORIDE (POUR BTL) OPTIME
TOPICAL | Status: DC | PRN
  Administered 2016-07-30 (×3): 1000 mL

## 2016-07-30 MED ORDER — INSULIN ISOPHANE & REGULAR (HUMAN 70-30)100 UNIT/ML KWIKPEN: 0.0000 [IU] | PEN_INJECTOR | Freq: Three times a day (TID) | SUBCUTANEOUS | Status: DC

## 2016-07-30 MED ORDER — METHOCARBAMOL 500 MG PO TABS: 500.0000 mg | ORAL_TABLET | Freq: Four times a day (QID) | ORAL | Status: DC | PRN

## 2016-07-30 MED ORDER — GLUCOSE 4 G PO CHEW: 1.0000 | CHEWABLE_TABLET | Freq: Every day | ORAL | Status: DC | PRN

## 2016-07-30 MED ORDER — FENTANYL CITRATE (PF) 100 MCG/2ML IJ SOLN
INTRAMUSCULAR | Status: DC | PRN
  Administered 2016-07-30 (×2): 50 ug via INTRAVENOUS

## 2016-07-30 MED ORDER — METHOCARBAMOL 750 MG PO TABS: 750.0000 mg | ORAL_TABLET | Freq: Two times a day (BID) | ORAL | 0 refills | Status: DC | PRN

## 2016-07-30 MED ORDER — SODIUM CHLORIDE 0.9 % IV SOLN
INTRAVENOUS | Status: DC
  Administered 2016-07-30: 18:00:00 via INTRAVENOUS

## 2016-07-30 MED ORDER — OXYCODONE-ACETAMINOPHEN 5-325 MG PO TABS: 1.0000 | ORAL_TABLET | ORAL | 0 refills | Status: DC | PRN

## 2016-07-30 MED ORDER — METOCLOPRAMIDE HCL 5 MG/ML IJ SOLN: 5.0000 mg | Freq: Three times a day (TID) | INTRAMUSCULAR | Status: DC | PRN

## 2016-07-30 MED ORDER — SORBITOL 70 % SOLN: 30.0000 mL | Freq: Every day | Status: DC | PRN

## 2016-07-30 MED ORDER — FENTANYL CITRATE (PF) 100 MCG/2ML IJ SOLN
INTRAMUSCULAR | Status: AC
  Administered 2016-07-30: 100 ug via INTRAVENOUS
  Filled 2016-07-30: qty 2

## 2016-07-30 MED ORDER — ONDANSETRON HCL 4 MG PO TABS: 4.0000 mg | ORAL_TABLET | Freq: Three times a day (TID) | ORAL | 0 refills | Status: DC | PRN

## 2016-07-30 MED ORDER — METOCLOPRAMIDE HCL 5 MG PO TABS: 5.0000 mg | ORAL_TABLET | Freq: Three times a day (TID) | ORAL | Status: DC | PRN

## 2016-07-30 MED ORDER — FENTANYL CITRATE (PF) 250 MCG/5ML IJ SOLN
INTRAMUSCULAR | Status: AC
  Filled 2016-07-30: qty 5

## 2016-07-30 MED ORDER — ASPIRIN 81 MG PO TABS: 81.0000 mg | ORAL_TABLET | Freq: Every day | ORAL | Status: DC

## 2016-07-30 MED ORDER — CARVEDILOL 12.5 MG PO TABS
12.5000 mg | ORAL_TABLET | Freq: Two times a day (BID) | ORAL | Status: DC
  Administered 2016-07-30 – 2016-08-01 (×4): 12.5 mg via ORAL
  Filled 2016-07-30 (×4): qty 1

## 2016-07-30 MED ORDER — AMITRIPTYLINE HCL 50 MG PO TABS
75.0000 mg | ORAL_TABLET | Freq: Every day | ORAL | Status: DC
  Administered 2016-07-30 – 2016-07-31 (×2): 75 mg via ORAL
  Filled 2016-07-30 (×2): qty 1

## 2016-07-30 MED ORDER — CARVEDILOL 12.5 MG PO TABS
12.5000 mg | ORAL_TABLET | Freq: Once | ORAL | Status: AC
  Administered 2016-07-30: 12.5 mg via ORAL
  Filled 2016-07-30: qty 1

## 2016-07-30 MED ORDER — INSULIN ASPART 100 UNIT/ML ~~LOC~~ SOLN
0.0000 [IU] | Freq: Every day | SUBCUTANEOUS | Status: DC
  Administered 2016-07-30: 2 [IU] via SUBCUTANEOUS

## 2016-07-30 MED ORDER — INSULIN GLARGINE 100 UNIT/ML ~~LOC~~ SOLN
55.0000 [IU] | Freq: Two times a day (BID) | SUBCUTANEOUS | Status: DC
  Administered 2016-07-30 – 2016-08-01 (×4): 55 [IU] via SUBCUTANEOUS
  Filled 2016-07-30 (×5): qty 0.55

## 2016-07-30 MED ORDER — ATORVASTATIN CALCIUM 10 MG PO TABS
10.0000 mg | ORAL_TABLET | Freq: Every day | ORAL | Status: DC
  Administered 2016-07-30 – 2016-08-01 (×3): 10 mg via ORAL
  Filled 2016-07-30 (×3): qty 1

## 2016-07-30 MED ORDER — MIDAZOLAM HCL 2 MG/2ML IJ SOLN
2.0000 mg | Freq: Once | INTRAMUSCULAR | Status: AC
  Administered 2016-07-30: 2 mg via INTRAVENOUS

## 2016-07-30 MED ORDER — AMLODIPINE BESYLATE 5 MG PO TABS
5.0000 mg | ORAL_TABLET | Freq: Every day | ORAL | Status: DC
  Administered 2016-07-30 – 2016-08-01 (×3): 5 mg via ORAL
  Filled 2016-07-30 (×3): qty 1

## 2016-07-30 MED ORDER — OXYCODONE HCL 5 MG PO TABS
5.0000 mg | ORAL_TABLET | ORAL | Status: DC | PRN
  Administered 2016-07-30 – 2016-08-01 (×8): 10 mg via ORAL
  Administered 2016-08-01: 5 mg via ORAL
  Administered 2016-08-01: 10 mg via ORAL
  Filled 2016-07-30 (×4): qty 2
  Filled 2016-07-30: qty 1
  Filled 2016-07-30 (×5): qty 2

## 2016-07-30 MED ORDER — ONDANSETRON HCL 4 MG/2ML IJ SOLN: 4.0000 mg | Freq: Four times a day (QID) | INTRAMUSCULAR | Status: DC | PRN

## 2016-07-30 MED ORDER — ASPIRIN EC 325 MG PO TBEC
325.0000 mg | DELAYED_RELEASE_TABLET | Freq: Two times a day (BID) | ORAL | Status: DC
  Administered 2016-07-30 – 2016-08-01 (×5): 325 mg via ORAL
  Filled 2016-07-30 (×5): qty 1

## 2016-07-30 MED ORDER — GABAPENTIN 300 MG PO CAPS
900.0000 mg | ORAL_CAPSULE | Freq: Two times a day (BID) | ORAL | Status: DC
  Administered 2016-07-30 – 2016-08-01 (×4): 900 mg via ORAL
  Filled 2016-07-30 (×4): qty 3

## 2016-07-30 MED ORDER — SUGAMMADEX SODIUM 200 MG/2ML IV SOLN
INTRAVENOUS | Status: AC
Start: 2016-07-30 — End: 2016-07-30
  Filled 2016-07-30: qty 2

## 2016-07-30 MED ORDER — INSULIN ASPART 100 UNIT/ML ~~LOC~~ SOLN
0.0000 [IU] | Freq: Three times a day (TID) | SUBCUTANEOUS | Status: DC
  Administered 2016-07-30 – 2016-07-31 (×4): 5 [IU] via SUBCUTANEOUS
  Administered 2016-08-01 (×2): 3 [IU] via SUBCUTANEOUS

## 2016-07-30 MED ORDER — FENTANYL CITRATE (PF) 100 MCG/2ML IJ SOLN
100.0000 ug | Freq: Once | INTRAMUSCULAR | Status: AC
  Administered 2016-07-30: 100 ug via INTRAVENOUS

## 2016-07-30 MED ORDER — INSULIN ASPART 100 UNIT/ML ~~LOC~~ SOLN: 6.0000 [IU] | Freq: Once | SUBCUTANEOUS | Status: DC

## 2016-07-30 MED ORDER — NICOTINE 7 MG/24HR TD PT24
7.0000 mg | MEDICATED_PATCH | Freq: Every day | TRANSDERMAL | Status: DC
  Administered 2016-07-31: 7 mg via TRANSDERMAL
  Filled 2016-07-30 (×3): qty 1

## 2016-07-30 MED ORDER — CEFAZOLIN SODIUM-DEXTROSE 2-4 GM/100ML-% IV SOLN
2.0000 g | Freq: Four times a day (QID) | INTRAVENOUS | Status: AC
  Administered 2016-07-30 – 2016-07-31 (×3): 2 g via INTRAVENOUS
  Filled 2016-07-30 (×3): qty 100

## 2016-07-30 MED ORDER — ACETAMINOPHEN 325 MG PO TABS
650.0000 mg | ORAL_TABLET | Freq: Four times a day (QID) | ORAL | Status: DC | PRN
  Administered 2016-07-30: 650 mg via ORAL
  Filled 2016-07-30: qty 2

## 2016-07-30 MED ORDER — ONDANSETRON HCL 4 MG/2ML IJ SOLN
INTRAMUSCULAR | Status: DC | PRN
  Administered 2016-07-30: 4 mg via INTRAVENOUS

## 2016-07-30 MED ORDER — CEFAZOLIN SODIUM-DEXTROSE 2-4 GM/100ML-% IV SOLN
2.0000 g | INTRAVENOUS | Status: AC
  Administered 2016-07-30: 2 g via INTRAVENOUS
  Filled 2016-07-30: qty 100

## 2016-07-30 MED ORDER — MORPHINE SULFATE (PF) 2 MG/ML IV SOLN
1.0000 mg | INTRAVENOUS | Status: DC | PRN
  Administered 2016-07-30 – 2016-07-31 (×2): 1 mg via INTRAVENOUS
  Filled 2016-07-30 (×3): qty 1

## 2016-07-30 MED ORDER — MAGNESIUM CITRATE PO SOLN
1.0000 | Freq: Once | ORAL | Status: DC | PRN
Start: 2016-07-30 — End: 2016-08-01

## 2016-07-30 MED ORDER — FREESTYLE SYSTEM KIT: 1.0000 | PACK | Status: DC | PRN

## 2016-07-30 MED ORDER — INSULIN ASPART 100 UNIT/ML ~~LOC~~ SOLN: 0.0000 [IU] | Freq: Every day | SUBCUTANEOUS | Status: DC

## 2016-07-30 MED ORDER — LIDOCAINE 2% (20 MG/ML) 5 ML SYRINGE
INTRAMUSCULAR | Status: DC | PRN
  Administered 2016-07-30: 40 mg via INTRAVENOUS

## 2016-07-30 MED ORDER — ONDANSETRON HCL 4 MG PO TABS: 4.0000 mg | ORAL_TABLET | Freq: Four times a day (QID) | ORAL | Status: DC | PRN

## 2016-07-30 MED ORDER — EPHEDRINE SULFATE 50 MG/ML IJ SOLN
INTRAMUSCULAR | Status: DC | PRN
  Administered 2016-07-30 (×3): 10 mg via INTRAVENOUS

## 2016-07-30 MED ORDER — TRAMADOL HCL 50 MG PO TABS: 50.0000 mg | ORAL_TABLET | Freq: Two times a day (BID) | ORAL | Status: DC | PRN

## 2016-07-30 MED ORDER — OXYCODONE HCL ER 10 MG PO T12A: 10.0000 mg | EXTENDED_RELEASE_TABLET | Freq: Two times a day (BID) | ORAL | 0 refills | Status: DC

## 2016-07-30 MED ORDER — MIDAZOLAM HCL 2 MG/2ML IJ SOLN
INTRAMUSCULAR | Status: AC
  Filled 2016-07-30: qty 2

## 2016-07-30 MED ORDER — MIDAZOLAM HCL 2 MG/2ML IJ SOLN
INTRAMUSCULAR | Status: AC
Start: 2016-07-30 — End: 2016-07-30
  Administered 2016-07-30: 2 mg via INTRAVENOUS
  Filled 2016-07-30: qty 2

## 2016-07-30 MED ORDER — SODIUM CHLORIDE 0.9 % IV SOLN
INTRAVENOUS | Status: DC
  Administered 2016-07-30: 13:00:00 via INTRAVENOUS

## 2016-07-30 MED ORDER — INSULIN ASPART 100 UNIT/ML ~~LOC~~ SOLN: 0.0000 [IU] | Freq: Three times a day (TID) | SUBCUTANEOUS | Status: DC

## 2016-07-30 MED ORDER — CLONIDINE HCL 0.1 MG PO TABS
0.1000 mg | ORAL_TABLET | Freq: Two times a day (BID) | ORAL | Status: DC
  Administered 2016-07-30 – 2016-08-01 (×4): 0.1 mg via ORAL
  Filled 2016-07-30 (×4): qty 1

## 2016-07-30 MED ORDER — LACTATED RINGERS IV SOLN
INTRAVENOUS | Status: DC | PRN
  Administered 2016-07-30: 14:00:00 via INTRAVENOUS

## 2016-07-30 MED ORDER — POLYETHYLENE GLYCOL 3350 17 G PO PACK: 17.0000 g | PACK | Freq: Every day | ORAL | Status: DC | PRN

## 2016-07-30 MED ORDER — BUPIVACAINE-EPINEPHRINE (PF) 0.5% -1:200000 IJ SOLN
INTRAMUSCULAR | Status: DC | PRN
  Administered 2016-07-30: 30 mL via PERINEURAL

## 2016-07-30 MED ORDER — PHENYLEPHRINE HCL 10 MG/ML IJ SOLN
INTRAMUSCULAR | Status: DC | PRN
  Administered 2016-07-30: 80 ug via INTRAVENOUS
  Administered 2016-07-30 (×2): 120 ug via INTRAVENOUS

## 2016-07-30 MED ORDER — INSULIN ASPART 100 UNIT/ML ~~LOC~~ SOLN
10.0000 [IU] | Freq: Once | SUBCUTANEOUS | Status: AC
  Administered 2016-07-30: 10 [IU] via SUBCUTANEOUS

## 2016-07-30 MED ORDER — SERTRALINE HCL 50 MG PO TABS
150.0000 mg | ORAL_TABLET | Freq: Every day | ORAL | Status: DC
  Administered 2016-07-30 – 2016-07-31 (×2): 150 mg via ORAL
  Filled 2016-07-30 (×3): qty 1

## 2016-07-30 MED ORDER — BUPIVACAINE HCL (PF) 0.5 % IJ SOLN
INTRAMUSCULAR | Status: DC | PRN
  Administered 2016-07-30: 15 mL

## 2016-07-30 MED ORDER — FENTANYL CITRATE (PF) 100 MCG/2ML IJ SOLN: 50.0000 ug | Freq: Once | INTRAMUSCULAR | Status: DC

## 2016-07-30 MED ORDER — ACETAMINOPHEN 650 MG RE SUPP: 650.0000 mg | Freq: Four times a day (QID) | RECTAL | Status: DC | PRN

## 2016-07-30 MED ORDER — HYDRALAZINE HCL 25 MG PO TABS
25.0000 mg | ORAL_TABLET | Freq: Three times a day (TID) | ORAL | Status: DC
  Administered 2016-07-30 – 2016-08-01 (×7): 25 mg via ORAL
  Filled 2016-07-30 (×7): qty 1

## 2016-07-30 MED ORDER — PROPOFOL 10 MG/ML IV BOLUS
INTRAVENOUS | Status: DC | PRN
  Administered 2016-07-30: 140 mg via INTRAVENOUS

## 2016-07-30 MED ORDER — PANTOPRAZOLE SODIUM 40 MG PO TBEC
40.0000 mg | DELAYED_RELEASE_TABLET | Freq: Every day | ORAL | Status: DC
  Administered 2016-07-30 – 2016-08-01 (×3): 40 mg via ORAL
  Filled 2016-07-30 (×3): qty 1

## 2016-07-30 MED ORDER — METHOCARBAMOL 500 MG PO TABS: 500.0000 mg | ORAL_TABLET | Freq: Three times a day (TID) | ORAL | Status: DC | PRN

## 2016-07-30 MED ORDER — METHOCARBAMOL 1000 MG/10ML IJ SOLN
500.0000 mg | Freq: Four times a day (QID) | INTRAVENOUS | Status: DC | PRN
  Filled 2016-07-30: qty 5

## 2016-07-30 MED ORDER — ASPIRIN EC 325 MG PO TBEC: 325.0000 mg | DELAYED_RELEASE_TABLET | Freq: Two times a day (BID) | ORAL | 0 refills | Status: DC

## 2016-07-30 MED ORDER — FENTANYL CITRATE (PF) 100 MCG/2ML IJ SOLN: INTRAMUSCULAR | Status: DC | PRN

## 2016-07-30 MED ORDER — SENNOSIDES-DOCUSATE SODIUM 8.6-50 MG PO TABS: 1.0000 | ORAL_TABLET | Freq: Every evening | ORAL | 1 refills | Status: DC | PRN

## 2016-07-30 MED ORDER — DIPHENHYDRAMINE HCL 12.5 MG/5ML PO ELIX: 25.0000 mg | ORAL_SOLUTION | ORAL | Status: DC | PRN

## 2016-07-30 SURGICAL SUPPLY — 86 items
BANDAGE ELASTIC 4 VELCRO ST LF (GAUZE/BANDAGES/DRESSINGS) IMPLANT
BANDAGE ELASTIC 6 VELCRO ST LF (GAUZE/BANDAGES/DRESSINGS) ×2 IMPLANT
BANDAGE ESMARK 6X9 LF (GAUZE/BANDAGES/DRESSINGS) ×1 IMPLANT
BIT DRILL 2.7 QC CANN 155 (BIT) ×2 IMPLANT
BIT DRILL QC 2.0 SHORT EVOS SM (DRILL) ×1 IMPLANT
BIT DRILL QC 2.5MM SHRT EVO SM (DRILL) ×1 IMPLANT
BLADE SURG 15 STRL LF DISP TIS (BLADE) ×1 IMPLANT
BLADE SURG 15 STRL SS (BLADE) ×1
BNDG COHESIVE 4X5 TAN STRL (GAUZE/BANDAGES/DRESSINGS) ×2 IMPLANT
BNDG COHESIVE 4X5 WHT NS (GAUZE/BANDAGES/DRESSINGS) ×2 IMPLANT
BNDG COHESIVE 6X5 TAN STRL LF (GAUZE/BANDAGES/DRESSINGS) ×2 IMPLANT
BNDG ESMARK 6X9 LF (GAUZE/BANDAGES/DRESSINGS) ×2
CANISTER SUCT 3000ML PPV (MISCELLANEOUS) IMPLANT
COVER SURGICAL LIGHT HANDLE (MISCELLANEOUS) ×2 IMPLANT
CUFF TOURNIQUET SINGLE 34IN LL (TOURNIQUET CUFF) ×2 IMPLANT
CUFF TOURNIQUET SINGLE 44IN (TOURNIQUET CUFF) IMPLANT
DRAPE C-ARM 42X72 X-RAY (DRAPES) ×2 IMPLANT
DRAPE C-ARMOR (DRAPES) ×2 IMPLANT
DRAPE IMP U-DRAPE 54X76 (DRAPES) ×2 IMPLANT
DRAPE INCISE IOBAN 66X45 STRL (DRAPES) IMPLANT
DRAPE U-SHAPE 47X51 STRL (DRAPES) ×2 IMPLANT
DRILL QC 2.0 SHORT EVOS SM (DRILL) ×2
DRILL QC 2.5MM SHORT EVOS SM (DRILL) ×2
DRSG PAD ABDOMINAL 8X10 ST (GAUZE/BANDAGES/DRESSINGS) ×2 IMPLANT
DURAPREP 26ML APPLICATOR (WOUND CARE) ×2 IMPLANT
ELECT CAUTERY BLADE 6.4 (BLADE) ×2 IMPLANT
ELECT REM PT RETURN 9FT ADLT (ELECTROSURGICAL) ×2
ELECTRODE REM PT RTRN 9FT ADLT (ELECTROSURGICAL) ×1 IMPLANT
FACESHIELD WRAPAROUND (MASK) ×2 IMPLANT
GAUZE SPONGE 4X4 12PLY STRL (GAUZE/BANDAGES/DRESSINGS) ×2 IMPLANT
GAUZE XEROFORM 5X9 LF (GAUZE/BANDAGES/DRESSINGS) ×2 IMPLANT
GLOVE BIOGEL PI IND STRL 6.5 (GLOVE) ×1 IMPLANT
GLOVE BIOGEL PI IND STRL 7.5 (GLOVE) ×1 IMPLANT
GLOVE BIOGEL PI INDICATOR 6.5 (GLOVE) ×1
GLOVE BIOGEL PI INDICATOR 7.5 (GLOVE) ×1
GLOVE SKINSENSE NS SZ7.5 (GLOVE) ×1
GLOVE SKINSENSE STRL SZ7.5 (GLOVE) ×1 IMPLANT
GLOVE SURG SS PI 6.0 STRL IVOR (GLOVE) ×2 IMPLANT
GLOVE SURG SS PI 6.5 STRL IVOR (GLOVE) ×2 IMPLANT
GLOVE SURG SYN 7.5  E (GLOVE) ×3
GLOVE SURG SYN 7.5 E (GLOVE) ×3 IMPLANT
GOWN STRL REIN XL XLG (GOWN DISPOSABLE) ×2 IMPLANT
GUIDE PIN 1.3 (Pin) ×4 IMPLANT
KIT BASIN OR (CUSTOM PROCEDURE TRAY) ×2 IMPLANT
KIT ROOM TURNOVER OR (KITS) ×2 IMPLANT
MANIFOLD NEPTUNE WASTE (CANNULA) ×2 IMPLANT
NEEDLE HYPO 25GX1X1/2 BEV (NEEDLE) IMPLANT
NS IRRIG 1000ML POUR BTL (IV SOLUTION) ×2 IMPLANT
PACK ORTHO EXTREMITY (CUSTOM PROCEDURE TRAY) ×2 IMPLANT
PAD ARMBOARD 7.5X6 YLW CONV (MISCELLANEOUS) ×4 IMPLANT
PAD CAST 3X4 CTTN HI CHSV (CAST SUPPLIES) IMPLANT
PADDING CAST COTTON 3X4 STRL (CAST SUPPLIES)
PADDING CAST COTTON 6X4 STRL (CAST SUPPLIES) ×2 IMPLANT
PADDING CAST SYN 6 (CAST SUPPLIES) ×1
PADDING CAST SYNTHETIC 4 (CAST SUPPLIES) ×2
PADDING CAST SYNTHETIC 4X4 STR (CAST SUPPLIES) ×2 IMPLANT
PADDING CAST SYNTHETIC 6X4 NS (CAST SUPPLIES) ×1 IMPLANT
PIN GUIDE 1.3 (Pin) ×2 IMPLANT
PLATE FIB EVOS 2.7/3.5 7H R103 (Plate) ×2 IMPLANT
PUTTY DBX 1CC (Putty) ×2 IMPLANT
PUTTY DBX 1CC DEPUY (Putty) ×1 IMPLANT
SCREW CANNULATED 4.0X35 (Screw) ×2 IMPLANT
SCREW CANNULATED 4.0X36 (Screw) ×2 IMPLANT
SCREW CORT 2.7X14 T8 EVOS (Screw) ×4 IMPLANT
SCREW CORT 2.7X15 T8 ST EVOS (Screw) ×2 IMPLANT
SCREW CORT 2.7X17 T8 ST EVOS (Screw) ×2 IMPLANT
SCREW CORT 2.7X20 T8 ST EVOS (Screw) ×2 IMPLANT
SCREW CORT 2.7X22 T8 ST EVOS (Screw) ×2 IMPLANT
SCREW CORT EVOS ST 3.5X12 (Screw) ×6 IMPLANT
SPLINT FIBERGLASS 4X30 (CAST SUPPLIES) ×2 IMPLANT
SPONGE GAUZE 4X4 12PLY STER LF (GAUZE/BANDAGES/DRESSINGS) ×2 IMPLANT
SPONGE LAP 18X18 X RAY DECT (DISPOSABLE) ×2 IMPLANT
SUCTION FRAZIER HANDLE 10FR (MISCELLANEOUS) ×2
SUCTION TUBE FRAZIER 10FR DISP (MISCELLANEOUS) ×2 IMPLANT
SUT ETHILON 3 0 PS 1 (SUTURE) ×2 IMPLANT
SUT VIC AB 0 CT1 27 (SUTURE) ×1
SUT VIC AB 0 CT1 27XBRD ANBCTR (SUTURE) ×1 IMPLANT
SUT VIC AB 2-0 CT1 27 (SUTURE) ×1
SUT VIC AB 2-0 CT1 TAPERPNT 27 (SUTURE) ×1 IMPLANT
SYR CONTROL 10ML LL (SYRINGE) IMPLANT
TOWEL OR 17X24 6PK STRL BLUE (TOWEL DISPOSABLE) ×2 IMPLANT
TOWEL OR 17X26 10 PK STRL BLUE (TOWEL DISPOSABLE) ×4 IMPLANT
TUBE CONNECTING 12X1/4 (SUCTIONS) ×2 IMPLANT
UNDERPAD 30X30 INCONTINENT (UNDERPADS AND DIAPERS) ×2 IMPLANT
WATER STERILE IRR 1000ML POUR (IV SOLUTION) IMPLANT
YANKAUER SUCT BULB TIP NO VENT (SUCTIONS) ×2 IMPLANT
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
## 2016-07-30 NOTE — Op Note (Signed)
Date of Surgery: 07/30/2016  INDICATIONS: [name] is a 55 y.o.-year-old male who sustained a right ankle fracture; he was indicated for open reduction and internal fixation due to the displaced nature of the articular fracture and came to the operating room today for this procedure. The patient did consent to the procedure after discussion of the risks and benefits.  PREOPERATIVE DIAGNOSIS: right trimalleolar ankle fracture  POSTOPERATIVE DIAGNOSIS: Same.  PROCEDURE: Open treatment of right ankle fracture with internal fixation. Trimalleolar w/o fixation of posterior malleolus CPT 27822.   SURGEON: N. Eduard Roux, M.D.  ASSIST: April Green, RNFA.  ANESTHESIA:  general, regional  TOURNIQUET TIME: less than 90 mins  IV FLUIDS AND URINE: See anesthesia.  ESTIMATED BLOOD LOSS: minimal mL.  IMPLANTS: [employer]  COMPLICATIONS: None.  DESCRIPTION OF PROCEDURE: The patient was brought to the operating room and placed supine on the operating table.  The patient had been signed prior to the procedure and this was documented. The patient had the anesthesia placed by the anesthesiologist.  A nonsterile tourniquet was placed on the upper thigh.  The prep verification and incision time-outs were performed to confirm that this was the correct patient, site, side and location. The patient had an SCD on the opposite lower extremity. The patient did receive antibiotics prior to the incision and was re-dosed during the procedure as needed at indicated intervals.  The patient had the lower extremity prepped and draped in the standard surgical fashion.  The extremity was exsanguinated using an esmarch bandage and the tourniquet was inflated to 300 mm Hg.  A lateral incision over the distal fibula was created. Full-thickness flaps were elevated off of the fibula. The fracture was exposed. Organized hematoma was removed. The fibula fracture had a separate anterior piece that was fractured off the  rest of the fibula had very little soft tissue attachment. This was provisionally removed to help with the reduction. I then obtained a reduction of the main fibular fragments and they were clamped. A posterior to anterior lag screw was placed. I then placed a precontoured distal fibula plate at the appropriate position using fluoroscopy. I then placed 3 nonlocking screws proximal to the fracture and 4 2.7 mm locking screws through the distal cluster.  The fracture fragment was then placed back into the bony void after I placed 1 mL of demineralized bone matrix. I then turned my attention to the medial side. It was made over the medial malleolus. Full-thickness flaps were elevated. The neurovascular bundle was identified and mobilized. The fracture was exposed. The periosteum was removed. The fracture was reduced and clamped. 2 parallel K wires were advanced up the medial malleolus. Fluoroscopy was used to confirm placement. I then placed a partially threaded 36 mm 4.0 cannulated screw over the anterior wire to gain compression across the fracture. I then placed a 35 mm fully threaded 4.0 cannulated screw over the posterior wire and the wires were then removed.  Final x-rays were taken. I performed an external rotation stress test to assess the syndesmosis and this was stable. The wounds were then thoroughly irrigated. There close in a layered fashion using 0 Vicryl for the fascia, 2-0 Vicryl for the subcutaneous layer and 3-0 nylon for the skin. Sterile dressings were applied. Foot was immobilized in a posterior splint and 90. Patient tolerated the procedure well and no immediate competitions.  POSTOPERATIVE PLAN: [name] will remain nonweightbearing on this leg for approximately 6 weeks; [name] will return for  suture removal in 2 weeks.  He will be immobilized in a short leg splint and then transitioned to a CAM walker at his first follow up appointment.  [name] will receive DVT prophylaxis based  on other medications, activity level, and risk ratio of bleeding to thrombosis.  Brad Cecil, MD [place] 3:14 PM

## 2016-07-30 NOTE — Anesthesia Procedure Notes (Addendum)
Anesthesia Regional Block:  Popliteal block  Pre-Anesthetic Checklist: ,, timeout performed, Correct Patient, Correct Site, Correct Laterality, Correct Procedure, Correct Position, site marked, Risks and benefits discussed, pre-op evaluation,  At surgeon's request and post-op pain management  Laterality: Right  Prep: Maximum Sterile Barrier Precautions used, chloraprep       Needles:  Injection technique: Single-shot  Needle Type: Echogenic Stimulator Needle     Needle Length: 9cm 9 cm Needle Gauge: 21 and 21 G    Additional Needles:  Procedures: ultrasound guided (picture in chart) and nerve stimulator Popliteal block  Nerve Stimulator or Paresthesia:  Response: Peroneal,  Response: Tibial,   Additional Responses:   Narrative:  Start time: 07/30/2016 11:15 AM End time: 07/30/2016 11:25 AM Injection made incrementally with aspirations every 5 mL. Anesthesiologist: Roderic Palau  Additional Notes: 2% Lidocaine skin wheel. Saphenous block with 10cc of 0.5% Bupivicaine plain.

## 2016-07-30 NOTE — Discharge Instructions (Signed)
° ° °  1. Keep splint clean and dry °2. Elevate foot above level of the heart °3. Take aspirin to prevent blood clots °4. Take pain meds as needed °5. Strict non weight bearing to operative extremity ° °

## 2016-07-30 NOTE — Transfer of Care (Signed)
Immediate Anesthesia Transfer of Care Note  Patient: Brad [name]  Procedure(s) Performed: Procedure(s): OPEN REDUCTION INTERNAL FIXATION (ORIF) RIGHT TRIMALLEOLAR ANKLE FRACTURE (Right)  Patient Location: PACU  Anesthesia Type:GA combined with regional for post-op pain  Level of Consciousness: awake, alert , oriented and patient cooperative  Airway & Oxygen Therapy: Patient Spontanous Breathing and Patient connected to nasal cannula oxygen  Post-op Assessment: Report given to RN and Post -op Vital signs reviewed and stable  Post vital signs: Reviewed and stable  Last Vitals:  Vitals:   07/30/16 1016 07/30/16 1528  BP: (!) 178/96   Pulse: 96   Resp: 18   Temp: 37.2 C (P) 36.7 C    Last Pain:  Vitals:   07/30/16 1056  TempSrc:   PainSc: 10-Worst pain ever      Patients Stated Pain Goal: 4 ([id number])  Complications: No apparent anesthesia complications

## 2016-07-30 NOTE — Anesthesia Procedure Notes (Signed)
Procedure Name: LMA Insertion Date/Time: 07/30/2016 1:20 PM Performed by: Salli Quarry Melchizedek Espinola Pre-anesthesia Checklist: Patient identified, Emergency Drugs available, Suction available and Patient being monitored Patient Re-evaluated:Patient Re-evaluated prior to inductionOxygen Delivery Method: Circle System Utilized Preoxygenation: Pre-oxygenation with 100% oxygen Intubation Type: IV induction LMA: LMA inserted LMA Size: 5.0 Number of attempts: 1 Airway Equipment and Method: Bite block Placement Confirmation: positive ETCO2 Tube secured with: Tape Dental Injury: Teeth and Oropharynx as per pre-operative assessment

## 2016-07-30 NOTE — Anesthesia Preprocedure Evaluation (Addendum)
Anesthesia Evaluation  Patient identified by MRN, date of birth, ID band Patient awake    Reviewed: Allergy & Precautions, H&P , NPO status , Patient's Chart, lab work & pertinent test results, reviewed documented beta blocker date and time   Airway Mallampati: II  TM Distance: >3 FB Neck ROM: Full    Dental no notable dental hx. (+) Poor Dentition, Dental Advisory Given   Pulmonary Current Smoker,    Pulmonary exam normal breath sounds clear to auscultation       Cardiovascular hypertension, Pt. on medications and On Home Beta Blockers + Past MI and +CHF   Rhythm:Regular Rate:Normal     Neuro/Psych Depression negative neurological ROS     GI/Hepatic GERD  Medicated and Controlled,(+) Hepatitis -, C  Endo/Other  diabetes  Renal/GU Renal InsufficiencyRenal disease  negative genitourinary   Musculoskeletal  (+) Arthritis ,   Abdominal   Peds  Hematology negative hematology ROS (+) anemia ,   Anesthesia Other Findings   Reproductive/Obstetrics negative OB ROS                            Anesthesia Physical Anesthesia Plan  ASA: III  Anesthesia Plan: General and Regional   Post-op Pain Management: GA combined w/ Regional for post-op pain   Induction: Intravenous  Airway Management Planned: LMA  Additional Equipment:   Intra-op Plan:   Post-operative Plan: Extubation in OR  Informed Consent: I have reviewed the patients History and Physical, chart, labs and discussed the procedure including the risks, benefits and alternatives for the proposed anesthesia with the patient or authorized representative who has indicated his/her understanding and acceptance.   Dental advisory given  Plan Discussed with: CRNA  Anesthesia Plan Comments:         Anesthesia Quick Evaluation

## 2016-07-31 ENCOUNTER — Encounter (HOSPITAL_COMMUNITY): Payer: Self-pay | Admitting: General Practice

## 2016-07-31 DIAGNOSIS — S82891A Other fracture of right lower leg, initial encounter for closed fracture: Secondary | ICD-10-CM | POA: Diagnosis not present

## 2016-07-31 DIAGNOSIS — I2510 Atherosclerotic heart disease of native coronary artery without angina pectoris: Secondary | ICD-10-CM | POA: Diagnosis not present

## 2016-07-31 DIAGNOSIS — N183 Chronic kidney disease, stage 3 (moderate): Secondary | ICD-10-CM | POA: Diagnosis not present

## 2016-07-31 DIAGNOSIS — E1165 Type 2 diabetes mellitus with hyperglycemia: Secondary | ICD-10-CM | POA: Diagnosis not present

## 2016-07-31 DIAGNOSIS — E1121 Type 2 diabetes mellitus with diabetic nephropathy: Secondary | ICD-10-CM | POA: Diagnosis not present

## 2016-07-31 LAB — CBC
HCT: 32.5 % — ABNORMAL LOW (ref 39.0–52.0)
Hemoglobin: 10.9 g/dL — ABNORMAL LOW (ref 13.0–17.0)
MCH: 31.8 pg (ref 26.0–34.0)
MCHC: 33.5 g/dL (ref 30.0–36.0)
MCV: 94.8 fL (ref 78.0–100.0)
Platelets: 168 10*3/uL (ref 150–400)
RBC: 3.43 MIL/uL — ABNORMAL LOW (ref 4.22–5.81)
RDW: 12 % (ref 11.5–15.5)
WBC: 9.1 10*3/uL (ref 4.0–10.5)

## 2016-07-31 LAB — BASIC METABOLIC PANEL
Anion gap: 8 (ref 5–15)
BUN: 11 mg/dL (ref 6–20)
CO2: 29 mmol/L (ref 22–32)
Calcium: 8.1 mg/dL — ABNORMAL LOW (ref 8.9–10.3)
Chloride: 95 mmol/L — ABNORMAL LOW (ref 101–111)
Creatinine, Ser: 1.42 mg/dL — ABNORMAL HIGH (ref 0.61–1.24)
GFR calc Af Amer: >60 mL/min (ref >60)
GFR calc non Af Amer: 54 mL/min — ABNORMAL LOW (ref >60)
Glucose, Bld: 199 mg/dL — ABNORMAL HIGH (ref 65–99)
Potassium: 3.7 mmol/L (ref 3.5–5.1)
Sodium: 132 mmol/L — ABNORMAL LOW (ref 135–145)

## 2016-07-31 LAB — GLUCOSE, CAPILLARY
Glucose-Capillary: 206 mg/dL — ABNORMAL HIGH (ref 65–99)
Glucose-Capillary: 250 mg/dL — ABNORMAL HIGH (ref 65–99)
Glucose-Capillary: 231 mg/dL — ABNORMAL HIGH (ref 65–99)
Glucose-Capillary: 190 mg/dL — ABNORMAL HIGH (ref 65–99)

## 2016-07-31 NOTE — Evaluation (Signed)
Physical Therapy Evaluation Patient Details Name: Marcus Bouder MRN: [id number] DOB: January 18, 1961 Today's Date: 07/31/2016   History of Present Illness  Pt is a 55 y/o male s/p ORIF for R trimalleolar fx. PMH including but not limited to CHF, CKD, DM, Hep C and HTN.  Clinical Impression  Pt presented supine in bed with HOB elevated. Pt was initially asleep, able to be aroused for participation in therapy session; however, remained lethargic throughout session. Pt was able to perform bed mobility to achieve sitting EOB with min A to lower R LE. Pt required mod A to perform transfers for stability and to achieve full standing position. Pt with one LOB with standing that required mod A to recover. Pt able to take a few hop steps with RW and min A; however, pt was very unstable and needed assistance with mobilizing walker as well. Pt reported not having any assistance currently available for when he is d/c home. He is adamant about returning home; however, at this time therapist does not feel that he is safe to return home with no assistance in place based on his performance during this evaluation. Therefore, therapist is recommending d/c to SNF at this time. If pt refuses SNF or makes excellent progress at next session and can safely d/c home, he will need the below listed equipment. Pt stated that he has a RW that he was using prior to admission. Pt would continue to benefit from skilled physical therapy services at this time while admitted and after d/c to address his below listed limitations in order to improve his overall safety and independence with functional mobility. PT plans to further gait train and stair train at next session.      Follow Up Recommendations Supervision/Assistance - 24 hour;SNF    Equipment Recommendations  3in1 (PT);Wheelchair (measurements PT);Wheelchair cushion (measurements PT);Other (comment) (w/c with elevating leg rests)    Recommendations for Other Services        Precautions / Restrictions Precautions Precautions: Fall Restrictions Weight Bearing Restrictions: Yes RLE Weight Bearing: Non weight bearing      Mobility  Bed Mobility Overal bed mobility: Needs Assistance Bed Mobility: Supine to Sit     Supine to sit: Min assist;HOB elevated     General bed mobility comments: pt required min A with R LE to lower safely from bed while maintaining NWB R LE status  Transfers Overall transfer level: Needs assistance Equipment used: Rolling walker (2 wheeled) Transfers: Sit to/from [facility] Sit to Stand: Mod assist;From elevated surface Stand pivot transfers: Mod assist       General transfer comment: pt required increased time and mod A to power up and achieve full standing posture. Pt also required mod A in standing with one LOB posteriorly.  Ambulation/Gait Ambulation/Gait assistance: Min assist Ambulation Distance (Feet): 2 Feet Assistive device: Rolling walker (2 wheeled) Gait Pattern/deviations: Step-to pattern (hop-to pattern to maintain NWB R LE status) Gait velocity: decreased Gait velocity interpretation: Below normal speed for age/gender General Gait Details: pt required min A with ambulation to assist with movement of walker and for safety  Stairs            Wheelchair Mobility    Modified [name] (Stroke Patients Only)       Balance Overall balance assessment: Needs assistance Sitting-balance support: Feet supported;Bilateral upper extremity supported Sitting balance-Leahy Scale: Poor     Standing balance support: Bilateral upper extremity supported;During functional activity Standing balance-Leahy Scale: Poor Standing balance comment: pt reliant on  RW for support and stability in standing                             Pertinent Vitals/Pain Pain Assessment: Faces Faces Pain Scale: Hurts a little bit Pain Location: R ankle Pain Descriptors / Indicators:  Grimacing;Guarding;Sore;Throbbing Pain Intervention(s): Monitored during session;Repositioned;Ice applied    Home Living Family/patient expects to be discharged to:: Private residence Living Arrangements: Alone Available Help at Discharge: Other (Comment) (pt is unable to state any available assist at d/c) Type of Home: Apartment Home Access: Stairs to enter Entrance Stairs-Rails: Right;Left;Can reach both Entrance Stairs-Number of Steps: 2 Home Layout: One level Home Equipment: Walker - 2 wheels      Prior Function Level of Independence: Independent with assistive device(s)         Comments: pt was using RW prior to admission     Hand Dominance        Extremity/Trunk Assessment   Upper Extremity Assessment: Defer to OT evaluation           Lower Extremity Assessment: RLE deficits/detail RLE Deficits / Details: Pt with decreased strength and ROM limitations secondary to post-op.        Communication   Communication: No difficulties  Cognition Arousal/Alertness: Lethargic Behavior During Therapy: WFL for tasks assessed/performed Overall Cognitive Status: No family/caregiver present to determine baseline cognitive functioning                      General Comments      Exercises        Assessment/Plan    PT Assessment Patient needs continued PT services  PT Diagnosis Difficulty walking   PT Problem List Decreased strength;Decreased range of motion;Decreased activity tolerance;Decreased balance;Decreased mobility;Decreased coordination;Decreased knowledge of use of DME;Pain  PT Treatment Interventions DME instruction;Gait training;Stair training;Functional mobility training;Therapeutic activities;Therapeutic exercise;Balance training;Patient/family education   PT Goals (Current goals can be found in the Care Plan section) Acute Rehab PT Goals Patient Stated Goal: return home PT Goal Formulation: With patient Time For Goal Achievement:  08/07/16 Potential to Achieve Goals: Good    Frequency 7X/week   Barriers to discharge Decreased caregiver support;Other (comment) (pt reported no assist available at d/c) pt reported having no assistance available currently for d/c. However, he stated that he prefers to return home rather than going to a SNF prior to returning home.    Co-evaluation               End of Session Equipment Utilized During Treatment: Gait belt Activity Tolerance: Patient limited by fatigue;Patient limited by lethargy;Patient limited by pain Patient left: in chair;with call bell/phone within reach;Other (comment) (R LE elevated) Nurse Communication: Mobility status;Other (comment) (d/c needs and barriers to d/c)         Time: [id number] PT Time Calculation (min) (ACUTE ONLY): 20 min   Charges:   PT Evaluation $PT Eval Moderate Complexity: 1 Procedure     PT G CodesClearnce Sorrel Honest Safranek 07/31/2016, 10:24 AM Sherie Don, PT, DPT [phone]

## 2016-07-31 NOTE — Progress Notes (Signed)
Inpatient Diabetes Program Recommendations  AACE/ADA: New Consensus Statement on Inpatient Glycemic Control (2015)  Target Ranges:  Prepandial:   less than 140 mg/dL      Peak postprandial:   less than 180 mg/dL (1-2 hours)      Critically ill patients:  140 - 180 mg/dL   Lab Results  Component Value Date   GLUCAP 250 (H) 07/31/2016   HGBA1C 9.5 (H) 07/01/2016    Review of Glycemic ControlResults for CAYDON, BALA (MRN [id number]) as of 07/31/2016 14:45  Ref. Range 07/30/2016 15:31 07/30/2016 17:34 07/30/2016 21:37 07/31/2016 06:17 07/31/2016 11:29  Glucose-Capillary Latest Ref Range: 65 - 99 mg/dL 147 (H) 239 (H) 256 (H) 206 (H) 250 (H)    Diabetes history: Type 2 diabetes Outpatient Diabetes medications: Lantus 55 units bid- per MD note with [facility] this is patient's home dose Current orders for Inpatient glycemic control:  Novolog resistant tid with meals and HS, Lantus 55 units bid  Inpatient Diabetes Program Recommendations:    May consider adding Novolog meal coverage 4 units tid with meals.  Note that patient is being followed by [facility] for his diabetes and had a visit on 07/22/16.  MD did not adjust medications due to blood sugars being improved.  Will need follow-up at [facility] as scheduled.  Thanks, Adah Perl, RN, BC-ADM Inpatient Diabetes Coordinator Pager [phone] (8a-5p)

## 2016-07-31 NOTE — Progress Notes (Signed)
   Subjective:  Patient reports pain as mild.    Objective:   VITALS:   Vitals:   07/30/16 2133 07/31/16 0057 07/31/16 0500 07/31/16 0614  BP: (!) 153/85 133/90  135/71  Pulse: 83 88  85  Resp: 19   16  Temp: 99.3 F (37.4 C) 99.1 F (37.3 C)  99.3 F (37.4 C)  TempSrc:      SpO2: 95% 99%  96%  Weight:   97.2 kg (214 lb 3.2 oz)   Height:        Neurologically intact Neurovascular intact Sensation intact distally Intact pulses distally Dorsiflexion/Plantar flexion intact Incision: dressing C/D/I and no drainage No cellulitis present Compartment soft   Lab Results  Component Value Date   WBC 9.1 07/31/2016   HGB 10.9 (L) 07/31/2016   HCT 32.5 (L) 07/31/2016   MCV 94.8 07/31/2016   PLT 168 07/31/2016     Assessment/Plan:  1 Day Post-Op   - Expected postop acute blood loss anemia - will monitor for symptoms - Up with PT/OT - DVT ppx - SCDs, ambulation, aspirin - NWB operative extremity - Pain control - patient is poor social support at home - DM is also poorly controlled - may need SNF, dispo pending  Marianna Payment 07/31/2016, 7:55 AM [phone]

## 2016-07-31 NOTE — Clinical Social Work Note (Signed)
Payer source: none- patient does not qualify for SNF at this time and is actually refusing SNF.  RNCM has completed disposition evaluation.  Patient states he will need a wheelchair and additional DME, but will go home at time of discharge with support and assistance with a friend.  CSW paged PT to provide this update.  CSW awaiting a return call.  CSW signing off.  Nonnie Done, LCSW [phone]  5N 21-32, 6N 21-32, 3C and [place] Licensed Clinical Social Worker

## 2016-07-31 NOTE — Progress Notes (Signed)
Orthopedic Tech Progress Note Patient Details:  Brad [name] [birth year] [id number]  Patient ID: [name], male   DOB: 1961-04-16, 55 y.o.   MRN: [id number] Applied ohf to bed  Karolee Stamps 07/31/2016, 7:16 AM

## 2016-07-31 NOTE — Anesthesia Postprocedure Evaluation (Signed)
Anesthesia Post Note  Patient: Brad [name]  Procedure(s) Performed: Procedure(s) (LRB): OPEN REDUCTION INTERNAL FIXATION (ORIF) RIGHT TRIMALLEOLAR ANKLE FRACTURE (Right)  Patient location during evaluation: PACU Anesthesia Type: General Level of consciousness: sedated Pain management: satisfactory to patient Vital Signs Assessment: post-procedure vital signs reviewed and stable Respiratory status: spontaneous breathing Cardiovascular status: stable Anesthetic complications: no    Last Vitals:  Vitals:   07/31/16 0057 07/31/16 0614  BP: 133/90 135/71  Pulse: 88 85  Resp:  16  Temp: 37.3 C 37.4 C    Last Pain:  Vitals:   07/31/16 0104  TempSrc:   PainSc: 10-Worst pain ever                 Riccardo Dubin

## 2016-07-31 NOTE — Evaluation (Signed)
Occupational Therapy Evaluation Patient Details Name: Brad [name] MRN: [id number] DOB: March 31, 1961 Today's Date: 07/31/2016    History of Present Illness Pt is a 55 y/o male s/p ORIF for R trimalleolar fx. PMH including but not limited to CHF, CKD, DM, Hep C and HTN.   Clinical Impression   Pt was living independently prior to admission. Presents with pain, generalized weakness, impaired balance and decreased cognition. He is disoriented with a poor memory including ability to recall NWB on R LE. Will follow acutely.    Follow Up Recommendations  Home health OT (pt is refusing SNF, concerned he will lose his home)    Equipment Recommendations  3 in 1 bedside comode    Recommendations for Other Services       Precautions / Restrictions Precautions Precautions: Fall Restrictions Weight Bearing Restrictions: Yes RLE Weight Bearing: Non weight bearing      Mobility Bed Mobility      General bed mobility comments: pt in chair  Transfers Overall transfer level: Needs assistance Equipment used: Rolling walker (2 wheeled) Transfers: Sit to/from [facility] Sit to Stand: Min assist Stand pivot transfers: Min assist       General transfer comment: pt needing max verbal cues to avoid weight on R LE    Balance Overall balance assessment: Needs assistance Sitting-balance support: Feet supported;Bilateral upper extremity supported Sitting balance-Leahy Scale: Fair (able to don shorts without LOB from chair)     Standing balance support: Bilateral upper extremity supported;During functional activity Standing balance-Leahy Scale: Poor Standing balance comment: pt reliant on RW for support and stability in standing                            ADL Overall ADL's : Needs assistance/impaired Eating/Feeding: Independent;Sitting   Grooming: Wash/dry hands;Wash/dry face;Sitting;Set up   Upper Body Bathing: Set up;Sitting   Lower Body Bathing:  Minimal assistance;Sit to/from stand   Upper Body Dressing : Set up;Sitting   Lower Body Dressing: Minimal assistance;Sit to/from stand Lower Body Dressing Details (indicate cue type and reason): pt able to don his shorts, dressing his R LE first, assist for balance when standing to pull up Toilet Transfer: Minimal assistance;RW;Stand-pivot Toilet Transfer Details (indicate cue type and reason): simulated                 Vision     Perception     Praxis      Pertinent Vitals/Pain Pain Assessment: Faces Faces Pain Scale: Hurts even more Pain Location: R knee Pain Descriptors / Indicators: Grimacing;Guarding Pain Intervention(s): Monitored during session;Repositioned     Hand Dominance Right   Extremity/Trunk Assessment Upper Extremity Assessment Upper Extremity Assessment: Overall WFL for tasks assessed   Lower Extremity Assessment Lower Extremity Assessment: Defer to PT evaluation RLE Deficits / Details: Pt with decreased strength and ROM limitations secondary to post-op.        Communication Communication Communication: No difficulties   Cognition Arousal/Alertness: Awake/alert Behavior During Therapy: WFL for tasks assessed/performed Overall Cognitive Status: No family/caregiver present to determine baseline cognitive functioning Area of Impairment: Orientation;Attention;Safety/judgement;Memory Orientation Level: Disoriented to;Place;Time;Situation Current Attention Level: Sustained Memory: Decreased short-term memory;Decreased recall of precautions   Safety/Judgement: Decreased awareness of safety;Decreased awareness of deficits     General Comments: pt thought he was in jail or a locked psych unit, once oriented to place pt stated he came to the hospital to get help due to his finances   General  Comments       Exercises       Shoulder Instructions      Home Living Family/patient expects to be discharged to:: Private residence Living  Arrangements: Alone Available Help at Discharge: Friend(s);Available PRN/intermittently (friend "Elberta Fortis") Type of Home: Apartment Home Access: Stairs to enter [employer] of Steps: 2 Entrance Stairs-Rails: Right;Left;Can reach both Home Layout: One level     Bathroom Shower/Tub: Teacher, early years/pre: Standard     Home Equipment: Environmental consultant - 2 wheels          Prior Functioning/Environment Level of Independence: Independent with assistive device(s)        Comments: pt was using RW prior to admission    OT Diagnosis: Generalized weakness;Cognitive deficits;Acute pain   OT Problem List: Decreased strength;Decreased activity tolerance;Impaired balance (sitting and/or standing);Decreased cognition;Decreased safety awareness;Decreased knowledge of use of DME or AE;Decreased knowledge of precautions;Pain   OT Treatment/Interventions: Self-care/ADL training;DME and/or AE instruction;Therapeutic activities;Cognitive remediation/compensation;Patient/family education;Balance training    OT Goals(Current goals can be found in the care plan section) Acute Rehab OT Goals Patient Stated Goal: return home OT Goal Formulation: With patient Time For Goal Achievement: 08/07/16 Potential to Achieve Goals: Fair ADL Goals Pt Will Perform Grooming: with modified independence;standing Pt Will Perform Lower Body Bathing: with modified independence;sit to/from stand Pt Will Perform Lower Body Dressing: with modified independence;sit to/from stand Pt Will Transfer to Toilet: with modified independence;ambulating;bedside commode (over toilet) Pt Will Perform Toileting - Clothing Manipulation and hygiene: with modified independence;sit to/from stand Additional ADL Goal #1: pt will gather ADL items from around room with RW maintaining NWB status on R LE.  OT Frequency: Min 2X/week   Barriers to D/C: Decreased caregiver support          Co-evaluation              End  of Session Equipment Utilized During Treatment: Rolling walker;Gait belt  Activity Tolerance: Patient tolerated treatment well Patient left: in chair;with call bell/phone within reach   Time: [id number] OT Time Calculation (min): 23 min Charges:  OT General Charges $OT Visit: 1 Procedure OT Evaluation $OT Eval Moderate Complexity: 1 Procedure OT Treatments $Self Care/Home Management : 8-22 mins G-Codes: OT G-codes **NOT FOR INPATIENT CLASS** Functional Assessment Tool Used: clinical judgement Functional Limitation: Self care Self Care Current Status [id number]): At least 20 percent but less than 40 percent impaired, limited or restricted Self Care Goal Status [id number]): At least 1 percent but less than 20 percent impaired, limited or restricted  Malka So 07/31/2016, 12:51 PM  [phone]

## 2016-07-31 NOTE — Care Management Note (Addendum)
Case Management Note  Patient Details  Name: Brad [name] MRN: [id number] Date of Birth: 10-Nov-1961  Subjective/Objective:   right trimalleolar ankle fracture with internal fixation                   Action/Plan: Discharge Planning:  NCM spoke to pt and states he is two months behind on his rent ($550 per month) and will be evicted from his apt. States his friend will be able to assist him with moving once he is dc from the hospital. States he cannot go to rehab at this time. Has RW. Pt used [employer] in July 2017. Will need wheelchair and 3n1 at time of dc. Pt reports he has applied for disability and his attorney is working on getting him approved.   PCP- Dr Arnoldo Morale  MD   08/01/2016 Contacted [facility] for DME for home, 3n1 and wheelchair. Contacted [facility] for [facility] PT. Pt does not qualify for charity [facility] with [facility].  Provided pt with contact numbers for local churches for assistance with rent. Pt contacted [employer] and [employer]. NCM encouraged pt to follow up with sister's to see if they can assist with rent. Spoke to [employer] at [employer] [phone]. They may be able to assist but would need to know his needs. Contacted apt complex, [phone] to get information on payment needed. Pt states they plan to change the locks on Monday if he does not come up with his payments.    Expected Discharge Date: 08/01/2016                 Expected Discharge Plan:  Home Health   In-House Referral:  Clinical Social Work  Discharge planning Services  CM Consult  Post Acute Care Choice:  NA Choice offered to:  NA  DME Arranged:  3-N-1, Lightweight manual wheelchair with seat cushion DME Agency:  [place]:  NA [place] Agency:  NA  Status of Service:  complete  If discussed at [employer], dates discussed:    Additional Comments:  Erenest Rasher, RN 07/31/2016, 10:10 AM

## 2016-07-31 NOTE — Progress Notes (Signed)
PROGRESS NOTE    Brad [name]  [id number] DOB: Aug 10, 1961 DOA: 07/30/2016 PCP: Arnoldo Morale, MD   Brief Narrative:  Brad [name] is an 55 y.o. male Tobacco use, rapidly, HTN, hepatitis C, GERD, DM, depression, CKD, CHF/diastolic, prostate cancer coming in for open reduction and internal fixation due to a displaced right ankle fracture. Patient sustained fracture on 07/24/2016 after a mechanical fall. He underwent ORIF of right ankle as well as trimalleolar w/o fixation of posterior malleolus.  Complicated social situation as patient owes rent money and refuses to go to SNF.     Assessment & Plan:   Active Problems:   GERD (gastroesophageal reflux disease)   Depression   Uncontrolled diabetes mellitus with diabetic nephropathy, with long-term current use of insulin (HCC)   CKD (chronic kidney disease) stage 3, GFR 30-59 ml/min   Cocaine abuse   CAD (coronary artery disease), native coronary artery   Ankle fracture, right   Surgery, elective   Closed right ankle fracture status post operative repair:  -Performed by Dr.Xu on 07/30/16. POD #1.  - Management per primary team.  Diabetes:  - Poorly controlled.  - Last A1c obtained on 07/01/2016 at 9.5.  - Continue Lantus 55 units BID - recent CBG of 250, 206, 256, and 239 - Hold home 70/30 as it is unclear what dose patient is taking - SSI for optimal control (received three 5 units doses since yesterday evening) - Diabetes educator - Patient will need follow up outpatient to fine tune his regimen  Hypertension: - Continue Norvasc, hydralazine, clonidine - Hydralazine PRN - Coreg and amlodipine - Highest BP 156/92    CKD: Cr 1.44 at admission - Cr 1.42 this am - continue IVF - will need to follow up with PCP to discuss CKD  Hyponatremia - Na of 132 today - gentle IVF/NS   CAD: Status post cardiac catheterization 07/10/2016 showing mild nonobstructive CAD. - Continue ASA, Lipitor - bblocker stopped due to  cocaine  Polysubstance abuse: Patient recently positive for cocaine - UDS not performed   Diastolic CHF:  - currently compensated - Echo shows EF 50% and grade 1 diastolic dysfunction. - Coreg continued.  - strict I/O, daily weights - Net -2110ml - will follow weight tomorrow am  Depression: - Continue Elavil, zoloft  Chronic pain / Neuropathic pain: - continue tramadol and neurontin, robaxin  GERD; - continue ppi    DVT prophylaxis: SCD's, ambulation and aspirin Code Status: Full Family Communication: no family present (patient states he is not on good terms with his family) Disposition Plan: likely to go home as patient is refusing SNF   Procedures:  Open treatment of right ankle fracture with internal fixation. Trimalleolar w/o fixation of posterior malleolusOpen treatment of right ankle fracture with internal fixation. Trimalleolar w/o fixation of posterior malleolus POD 1  Antimicrobials:   Pre- op cefazolin    Subjective: Patient is agitated when I arrived as he states he feels he no one is listening to his situation in which he states he cannot go to a SNF because he is going to lose his apartment and the complex will throw his stuff out or steal it.  He does mention he has a friend that would help him pack his belongings up but he would need to [facility] home first in order to help facilitate that.  AT this time he denies any pain either in his leg as well as chest pain.  He voices he is trying to not put weight on  his foot but it is difficult when you are used to having two legs and suddenly you cannot use one.  His friend/ pastor was in the room but I did not discuss patient's medical status while he was present.  Patient does seem slightly confused as he repeatedly is saying he had surgery this morning after coming to the ED last night and being sent home to get ready for surgery this morning.  Objective: Vitals:   07/30/16 2133 07/31/16 0057 07/31/16 0500 07/31/16  0614  BP: (!) 153/85 133/90  135/71  Pulse: 83 88  85  Resp: 19   16  Temp: 99.3 F (37.4 C) 99.1 F (37.3 C)  99.3 F (37.4 C)  TempSrc:      SpO2: 95% 99%  96%  Weight:   97.2 kg (214 lb 3.2 oz)   Height:        Intake/Output Summary (Last 24 hours) at 07/31/16 1132 Last data filed at 07/31/16 0800  Gross per 24 hour  Intake             1900 ml  Output             2230 ml  Net             -330 ml   Filed Weights   07/30/16 1016 07/31/16 0500  Weight: 93.6 kg (206 lb 6.4 oz) 97.2 kg (214 lb 3.2 oz)    Examination:  General exam: Appears calm and comfortable  Respiratory system: Clear to auscultation. Respiratory effort normal. Cardiovascular system: S1 & S2 heard, RRR. No JVD, murmurs, rubs, gallops or clicks. No pedal edema on left (right foot, ankle and leg is bandaged) Gastrointestinal system: Abdomen is nondistended, soft and nontender. No organomegaly or masses felt. Normal bowel sounds heard. Central nervous system: Alert and oriented x 3 (situation is questionable). No focal neurological deficits. Extremities: Symmetric 5 x 5 power (although unable to assess strength at right ankle), able to move all ten toes Skin: No rashes, lesions or ulcers Psychiatry: Judgement and insight appear normal. Mood & affect appropriate.     Data Reviewed: I have personally reviewed following labs and imaging studies  CBC:  Recent Labs Lab 07/30/16 1048 07/31/16 0447  WBC 7.3 9.1  HGB 11.4* 10.9*  HCT 33.1* 32.5*  MCV 94.3 94.8  PLT 169 [id number]   Basic Metabolic Panel:  Recent Labs Lab 07/30/16 1048 07/30/16 1915 07/31/16 0447  NA 129* 131* 132*  K 3.8 3.5 3.7  CL 95* 94* 95*  CO2 26 29 29   GLUCOSE 275* 267* 199*  BUN 11 13 11   CREATININE 1.44* 1.46* 1.42*  CALCIUM 8.9 8.3* 8.1*   GFR: Estimated Creatinine Clearance: 69.1 mL/min (by C-G formula based on SCr of 1.42 mg/dL). Liver Function Tests:  Recent Labs Lab 07/30/16 1048 07/30/16 1915  AST 40 38  ALT  48 42  ALKPHOS 65 66  BILITOT 0.7 0.7  PROT 7.7 7.6  ALBUMIN 3.1* 2.9*   No results for input(s): LIPASE, AMYLASE in the last 168 hours. No results for input(s): AMMONIA in the last 168 hours. Coagulation Profile: No results for input(s): INR, PROTIME in the last 168 hours. Cardiac Enzymes: No results for input(s): CKTOTAL, CKMB, CKMBINDEX, TROPONINI in the last 168 hours. BNP (last 3 results) No results for input(s): PROBNP in the last 8760 hours. HbA1C: No results for input(s): HGBA1C in the last 72 hours. CBG:  Recent Labs Lab 07/30/16 1531 07/30/16 1734 07/30/16 2137 07/31/16  0617 07/31/16 1129  GLUCAP 147* 239* 256* 206* 250*   Lipid Profile: No results for input(s): CHOL, HDL, LDLCALC, TRIG, CHOLHDL, LDLDIRECT in the last 72 hours. Thyroid Function Tests: No results for input(s): TSH, T4TOTAL, FREET4, T3FREE, THYROIDAB in the last 72 hours. Anemia Panel: No results for input(s): VITAMINB12, FOLATE, FERRITIN, TIBC, IRON, RETICCTPCT in the last 72 hours. Sepsis Labs: No results for input(s): PROCALCITON, LATICACIDVEN in the last 168 hours.  No results found for this or any previous visit (from the past 240 hour(s)).       Radiology Studies: Dg Ankle Complete Right  Result Date: 07/30/2016 CLINICAL DATA:  Elective surgery. EXAM: DG C-ARM 61-120 MIN; RIGHT ANKLE - COMPLETE 3+ VIEW COMPARISON:  CT from 6 days ago FINDINGS: Relocated ankle joint with medial malleolus and distal fibula fracture repair. Physiologic alignment of posterior malleolus fracture. Small nondisplaced posterior talus fracture on previous CT is not visualized. IMPRESSION: 1. Fluoroscopy for ankle fracture repair.  No unexpected finding. 2. Nondisplaced posterior talus fracture on previous CT is not visible. Electronically Signed   By: Monte Fantasia M.D.   On: 07/30/2016 15:17   Dg C-arm 61-120 Min  Result Date: 07/30/2016 CLINICAL DATA:  Elective surgery. EXAM: DG C-ARM 61-120 MIN; RIGHT ANKLE -  COMPLETE 3+ VIEW COMPARISON:  CT from 6 days ago FINDINGS: Relocated ankle joint with medial malleolus and distal fibula fracture repair. Physiologic alignment of posterior malleolus fracture. Small nondisplaced posterior talus fracture on previous CT is not visualized. IMPRESSION: 1. Fluoroscopy for ankle fracture repair.  No unexpected finding. 2. Nondisplaced posterior talus fracture on previous CT is not visible. Electronically Signed   By: Monte Fantasia M.D.   On: 07/30/2016 15:17        Scheduled Meds: . amitriptyline  75 mg Oral QHS  . amLODipine  5 mg Oral Daily  . aspirin EC  325 mg Oral BID  . atorvastatin  10 mg Oral Daily  . carvedilol  12.5 mg Oral BID  . cloNIDine  0.1 mg Oral BID  . gabapentin  900 mg Oral BID  . hydrALAZINE  25 mg Oral TID  . insulin aspart  0-15 Units Subcutaneous TID WC  . insulin aspart  0-5 Units Subcutaneous QHS  . insulin glargine  55 Units Subcutaneous BID  . nicotine  7 mg Transdermal Daily  . pantoprazole  40 mg Oral Daily  . sertraline  150 mg Oral Daily   Continuous Infusions: . sodium chloride Stopped (07/30/16 1345)  . sodium chloride 125 mL/hr at 07/30/16 1813     LOS: 0 days    Time spent: 35 minutes    Newman Pies, MD [facility] Pager [phone]  If 7PM-7AM, please contact night-coverage [email or web address] Password [email or web address] 07/31/2016, 11:32 AM

## 2016-07-31 NOTE — Progress Notes (Signed)
Physical Therapy Treatment Patient Details Name: Brad [name] MRN: [id number] DOB: July 17, 1961 Today's Date: 07/31/2016    History of Present Illness Pt is a 55 y/o male s/p ORIF for R trimalleolar fx. PMH including but not limited to CHF, CKD, DM, Hep C and HTN.    PT Comments    Pt presented supine in bed with HOB elevated, awake and willing to participate in therapy session. Pt with improved cognition as compared to previous PT and OT evaluation sessions. Pt also making good progress with ambulation. Pt was educated on ascending and descending one step with use of a stable chair to sitting at the top of the step for pt to sit down in to get into his home. During this session, unsafe for pt to stair train via hopping up backwards with use of RW.   Pt has refused to d/c to SNF secondary to financial issues; therefore, therapist recommending pt d/c home with [facility] PT services and 24 hr supervision. Pt now stating that he is able to have a friend to assist him at home after d/c. Pt would continue to benefit from skilled physical therapy services at this time while admitted and after d/c to address his below listed limitations in order to improve his overall safety and independence with functional mobility.    Follow Up Recommendations  Home health PT;Supervision/Assistance - 24 hour     Equipment Recommendations  3in1 (PT);Wheelchair (measurements PT);Wheelchair cushion (measurements PT);Other (comment) (w/c with elevating leg rests)    Recommendations for Other Services       Precautions / Restrictions Precautions Precautions: Fall Restrictions Weight Bearing Restrictions: Yes RLE Weight Bearing: Non weight bearing    Mobility  Bed Mobility Overal bed mobility: Needs Assistance Bed Mobility: Supine to Sit     Supine to sit: Min guard;HOB elevated     General bed mobility comments: pt required increased time to complete  Transfers Overall transfer level: Needs  assistance Equipment used: Rolling walker (2 wheeled) Transfers: Sit to/from Stand Sit to Stand: Min assist         General transfer comment: pt able to maintain NWB R LE status throughout transfer independently  Ambulation/Gait Ambulation/Gait assistance: Min guard Ambulation Distance (Feet): 50 Feet (50 ft x2 with sitting rest break in between) Assistive device: Rolling walker (2 wheeled) Gait Pattern/deviations: Step-to pattern (hop-to pattern to maintain NWB R LE status) Gait velocity: decreased Gait velocity interpretation: Below normal speed for age/gender     Stairs            Wheelchair Mobility    Modified [name] (Stroke Patients Only)       Balance Overall balance assessment: Needs assistance Sitting-balance support: Feet supported;No upper extremity supported Sitting balance-Leahy Scale: Fair     Standing balance support: Bilateral upper extremity supported;During functional activity Standing balance-Leahy Scale: Poor Standing balance comment: pt reliant on RW for stability and support                    Cognition Arousal/Alertness: Awake/alert Behavior During Therapy: WFL for tasks assessed/performed Overall Cognitive Status: No family/caregiver present to determine baseline cognitive functioning                 General Comments: pt was oriented to person, place and situation and was much more alert during this session.     Exercises      General Comments        Pertinent Vitals/Pain Pain Assessment: Faces Faces Pain Scale: Hurts  a little bit Pain Location: R ankle Pain Descriptors / Indicators: Grimacing;Guarding Pain Intervention(s): Monitored during session;Repositioned    Home Living                      Prior Function            PT Goals (current goals can now be found in the care plan section) Acute Rehab PT Goals Patient Stated Goal: return home PT Goal Formulation: With patient Time For Goal  Achievement: 08/07/16 Potential to Achieve Goals: Good Progress towards PT goals: Progressing toward goals    Frequency  7X/week    PT Plan Current plan remains appropriate    Co-evaluation             End of Session Equipment Utilized During Treatment: Gait belt Activity Tolerance: Patient limited by fatigue Patient left: in chair;with call bell/phone within reach;Other (comment) (R LE elevated)     Time: [id number] PT Time Calculation (min) (ACUTE ONLY): 25 min  Charges:  $Gait Training: 23-37 mins                    G CodesClearnce [name] Brad [name] 08-07-16, 4:41 PM Brad [name], [place], DPT [phone]

## 2016-08-01 ENCOUNTER — Encounter (HOSPITAL_COMMUNITY): Payer: Self-pay | Admitting: Orthopaedic Surgery

## 2016-08-01 DIAGNOSIS — W19XXXA Unspecified fall, initial encounter: Secondary | ICD-10-CM | POA: Diagnosis present

## 2016-08-01 DIAGNOSIS — E1121 Type 2 diabetes mellitus with diabetic nephropathy: Secondary | ICD-10-CM | POA: Diagnosis present

## 2016-08-01 DIAGNOSIS — Z9889 Other specified postprocedural states: Secondary | ICD-10-CM

## 2016-08-01 DIAGNOSIS — Z96652 Presence of left artificial knee joint: Secondary | ICD-10-CM | POA: Diagnosis present

## 2016-08-01 DIAGNOSIS — S82851A Displaced trimalleolar fracture of right lower leg, initial encounter for closed fracture: Secondary | ICD-10-CM | POA: Diagnosis present

## 2016-08-01 DIAGNOSIS — N183 Chronic kidney disease, stage 3 (moderate): Secondary | ICD-10-CM | POA: Diagnosis present

## 2016-08-01 DIAGNOSIS — Z79899 Other long term (current) drug therapy: Secondary | ICD-10-CM | POA: Diagnosis not present

## 2016-08-01 DIAGNOSIS — Z794 Long term (current) use of insulin: Secondary | ICD-10-CM | POA: Diagnosis not present

## 2016-08-01 DIAGNOSIS — Z8546 Personal history of malignant neoplasm of prostate: Secondary | ICD-10-CM | POA: Diagnosis not present

## 2016-08-01 DIAGNOSIS — S82891A Other fracture of right lower leg, initial encounter for closed fracture: Secondary | ICD-10-CM | POA: Diagnosis not present

## 2016-08-01 DIAGNOSIS — E871 Hypo-osmolality and hyponatremia: Secondary | ICD-10-CM | POA: Diagnosis not present

## 2016-08-01 DIAGNOSIS — K219 Gastro-esophageal reflux disease without esophagitis: Secondary | ICD-10-CM | POA: Diagnosis present

## 2016-08-01 DIAGNOSIS — Z7982 Long term (current) use of aspirin: Secondary | ICD-10-CM | POA: Diagnosis not present

## 2016-08-01 DIAGNOSIS — I2510 Atherosclerotic heart disease of native coronary artery without angina pectoris: Secondary | ICD-10-CM | POA: Diagnosis present

## 2016-08-01 DIAGNOSIS — E1140 Type 2 diabetes mellitus with diabetic neuropathy, unspecified: Secondary | ICD-10-CM | POA: Diagnosis present

## 2016-08-01 DIAGNOSIS — F329 Major depressive disorder, single episode, unspecified: Secondary | ICD-10-CM | POA: Diagnosis present

## 2016-08-01 DIAGNOSIS — B1920 Unspecified viral hepatitis C without hepatic coma: Secondary | ICD-10-CM | POA: Diagnosis present

## 2016-08-01 DIAGNOSIS — F17210 Nicotine dependence, cigarettes, uncomplicated: Secondary | ICD-10-CM | POA: Diagnosis present

## 2016-08-01 DIAGNOSIS — I130 Hypertensive heart and chronic kidney disease with heart failure and stage 1 through stage 4 chronic kidney disease, or unspecified chronic kidney disease: Secondary | ICD-10-CM | POA: Diagnosis present

## 2016-08-01 DIAGNOSIS — E1165 Type 2 diabetes mellitus with hyperglycemia: Secondary | ICD-10-CM | POA: Diagnosis present

## 2016-08-01 DIAGNOSIS — E1122 Type 2 diabetes mellitus with diabetic chronic kidney disease: Secondary | ICD-10-CM | POA: Diagnosis present

## 2016-08-01 DIAGNOSIS — Z8781 Personal history of (healed) traumatic fracture: Secondary | ICD-10-CM

## 2016-08-01 DIAGNOSIS — I5032 Chronic diastolic (congestive) heart failure: Secondary | ICD-10-CM | POA: Diagnosis present

## 2016-08-01 LAB — GLUCOSE, CAPILLARY
Glucose-Capillary: 182 mg/dL — ABNORMAL HIGH (ref 65–99)
Glucose-Capillary: 154 mg/dL — ABNORMAL HIGH (ref 65–99)
Glucose-Capillary: 82 mg/dL (ref 65–99)

## 2016-08-01 MED ORDER — ACETAMINOPHEN 325 MG PO TABS: 650.0000 mg | ORAL_TABLET | Freq: Four times a day (QID) | ORAL | Status: DC | PRN

## 2016-08-01 MED ORDER — CLONIDINE HCL 0.1 MG PO TABS: 0.1000 mg | ORAL_TABLET | Freq: Two times a day (BID) | ORAL | 11 refills | Status: DC

## 2016-08-01 MED ORDER — AMITRIPTYLINE HCL 75 MG PO TABS
75.0000 mg | ORAL_TABLET | Freq: Every day | ORAL | 0 refills | Status: DC
Start: 2016-08-01 — End: 2016-09-20

## 2016-08-01 MED FILL — OxyCONTIN 10 MG T12A: 10 | 5 days supply | Qty: 10 | Fill #0

## 2016-08-01 MED FILL — cloNIDine HCL 0.1 MG TABS: 0.1 | 30 days supply | Qty: 60 | Fill #0

## 2016-08-01 MED FILL — ONDANSETRON HCL 4 MG TABLET: 4 | 7 days supply | Qty: 40 | Fill #0

## 2016-08-01 MED FILL — OXYCODONE/APAP 5-325: 5-325 | 8 days supply | Qty: 90 | Fill #0

## 2016-08-01 NOTE — Progress Notes (Signed)
   Subjective:  Patient reports pain as mild.    Objective:   VITALS:   Vitals:   07/31/16 1348 07/31/16 2255 07/31/16 2352 08/01/16 0600  BP: 125/73 (!) 161/138 121/64 136/75  Pulse: 79 68  76  Resp: 17 18  18   Temp: 98.7 F (37.1 C) 98.4 F (36.9 C)  98.5 F (36.9 C)  TempSrc: Oral Oral  Oral  SpO2: 93% 100%  98%  Weight:    99.9 kg (220 lb 3.2 oz)  Height:        Neurologically intact Neurovascular intact Sensation intact distally Intact pulses distally Dorsiflexion/Plantar flexion intact Incision: dressing C/D/I and no drainage No cellulitis present Compartment soft   Lab Results  Component Value Date   WBC 9.1 07/31/2016   HGB 10.9 (L) 07/31/2016   HCT 32.5 (L) 07/31/2016   MCV 94.8 07/31/2016   PLT 168 07/31/2016     Assessment/Plan:  2 Days Post-Op   - patient in difficult social situation - stable from ortho stand point - blood glucose in better control - patient counseled about the importance of diabetes control  Marianna Payment 08/01/2016, 10:42 AM [phone]

## 2016-08-01 NOTE — Progress Notes (Signed)
Occupational Therapy Treatment Patient Details Name: Brad [name] MRN: [id number] DOB: 02-06-1961 Today's Date: 08/01/2016    History of present illness Pt is a 55 y/o male s/p ORIF for R trimalleolar fx. PMH including but not limited to CHF, CKD, DM, Hep C and HTN.   OT comments  Pt continues to demonstrate poor memory and safety awareness, unclear if this is his baseline. Cues needed for safety, to adhere to NWB status and min assist for sit to stand. Educated in use of 3 in 1 over toilet. Pt wanting to take a nap, refused to address bathing and dressing with OT.  Follow Up Recommendations  Home health OT    Equipment Recommendations  3 in 1 bedside comode (in room)    Recommendations for Other Services      Precautions / Restrictions Precautions Precautions: Fall Restrictions Weight Bearing Restrictions: Yes RLE Weight Bearing: Non weight bearing       Mobility Bed Mobility Overal bed mobility: Modified Independent Bed Mobility: Sit to Supine     Supine to sit: Supervision;HOB elevated     General bed mobility comments: self assisted R LE into bed with his UEs  Transfers Overall transfer level: Needs assistance Equipment used: Rolling walker (2 wheeled) Transfers: Sit to/from Stand Sit to Stand: Min assist         General transfer comment: cues for hand placement and weight bearing status, to gain balance     Balance Overall balance assessment: Needs assistance Sitting-balance support: Feet supported;No upper extremity supported Sitting balance-Leahy Scale: Good     Standing balance support: Bilateral upper extremity supported Standing balance-Leahy Scale: Poor                     ADL Overall ADL's : Needs assistance/impaired     Grooming: Wash/dry hands;Min guard;Standing                   Toilet Transfer: Ambulation;BSC;RW;Min Psychiatric nurse Details (indicate cue type and reason): instructed in use of 3i n 1 over  toilet Toileting- Clothing Manipulation and Hygiene: Min guard;Sit to/from stand       Functional mobility during ADLs: Min guard;Rolling walker;Cueing for safety General ADL Comments: pt refused to work on bathing and dressing      Vision                     Perception     Praxis      Cognition   Behavior During Therapy: [facility] for tasks assessed/performed Overall Cognitive Status: No family/caregiver present to determine baseline cognitive functioning Area of Impairment: Memory;Safety/judgement     Memory: Decreased recall of precautions;Decreased short-term memory    Safety/Judgement: Decreased awareness of safety;Decreased awareness of deficits     General Comments: pt was oriented to person, place and situation and was much more alert during this session.     Extremity/Trunk Assessment               Exercises     Shoulder Instructions       General Comments      Pertinent Vitals/ Pain       Pain Assessment: Faces Faces Pain Scale: No hurt Pain Intervention(s): Monitored during session  Home Living  Prior Functioning/Environment              Frequency Min 2X/week     Progress Toward Goals  OT Goals(current goals can now be found in the care plan section)  Progress towards OT goals: Progressing toward goals  Acute Rehab OT Goals Patient Stated Goal: take a nap Time For Goal Achievement: 08/07/16 Potential to Achieve Goals: [place] Discharge plan remains appropriate    Co-evaluation                 End of Session Equipment Utilized During Treatment: Rolling walker;Gait belt   Activity Tolerance Patient tolerated treatment well   Patient Left in bed;with call bell/phone within reach   Nurse Communication          Time: [id number] OT Time Calculation (min): 15 min  Charges: OT General Charges $OT Visit: 1 Procedure OT Treatments $Self Care/Home  Management : 8-22 mins  Malka So 08/01/2016, 1:34 PM  [phone]

## 2016-08-01 NOTE — Progress Notes (Signed)
Gave pt filled prescriptions for 90 oxycodone, 10 oxycontin, 60 clonididne, 40 zofran, and senna-lax. Verified with Faylene Million, RN that prescriptions given to pt.   [place]ovan, Jerry Caras

## 2016-08-01 NOTE — Progress Notes (Signed)
Orthopedic Tech Progress Note Patient Details:  Brad [name] [birth year] [id number]  Ortho Devices Type of Ortho Device: Crutches Ortho Device/Splint Interventions: Application   Hildred Priest 08/01/2016, 11:57 AM

## 2016-08-01 NOTE — Progress Notes (Signed)
Pt stable for d/c home today per MD. Cleared by PT, wheelchair with elevated leg rests, crutches, and 3N1 delivered to pt's room. CM filled some prescriptions at the [facility] since the [facility] closes at 5pm. The other prescriptions pt will have filled on Monday-they include tylenol, senna, and aspirin (pt has aspirin at home). Transportation home will be by taxi set up by CM. He has been given resources by CM & SW. Pt has been worried and discussing his housing situation throughout the day.   [place], Brad [name]

## 2016-08-01 NOTE — Progress Notes (Signed)
Spoke briefly with patient regarding the importance of glycemic control.  Encouraged him to follow-up with [place].  Patient discouraged due to lack of $ and rent.  He is in process of calling churches to request $ for rent.  Case manager in room assisting patient.   Thanks, Adah Perl, RN, BC-ADM Inpatient Diabetes Coordinator Pager [phone] (8a-5p)

## 2016-08-01 NOTE — Progress Notes (Addendum)
Call received from Unit RN that pt states his ride went to work and was not going to pick him up post dc. Contacted Care Management AD, Deveron Furlong RN. Pt provided MATCH with copay and narcotics override (surgery patient). [facility] closed at 5:30 pm. Spoke to [facility] pharmacist and [facility] can pull up profile for medication. NCM picked up medications from [place] and given to Unit RN. Will follow up with pt to see if he can have his Doristine Bosworth to meet him at home to assist him this evening. Contacted CSW for taxi voucher.  Pt provided information for [facility] (interactive resouce center) to follow up with housing, medications, and community resources to help pt who are homeless. Pt declined 3n1 and NCM placed back in [facility] closet. Will make [facility] aware.  Jonnie Finner RN CCM Case Mgmt phone [phone]

## 2016-08-01 NOTE — Progress Notes (Signed)
Verified patient's medications with [facility].  90 Percocet, 10 oxycontin, 60 Clonidine, 100 Senna and 40 Zofran.

## 2016-08-01 NOTE — Discharge Summary (Signed)
Physician Discharge Summary  Brad [name] KCL:[id number] DOB: 1961-08-21 DOA: 07/30/2016  PCP: Arnoldo Morale, MD  Admit date: 07/30/2016 Discharge date: 08/01/2016  Admitted From: Home Disposition:  Home   Recommendations for Outpatient Follow-up:  1. Follow up with [facility] at previously scheduled appointment 2. Please follow up with Ortho (follow instructions on discharge paperwork) 3. Please use resources given to you for help with housing, as well as financial assistance  Home Health:no Equipment/Devices:Wheelchair, Crutches, 3 n 1  Discharge Condition:Stable but guarded CODE STATUS:Full Diet recommendation: Carb Modified  Brief/Interim Summary: Brad [name] an 55 y.o.maleTobacco use, rapidly, HTN, hepatitis C, GERD, DM, depression, CKD, CHF/diastolic, prostate cancer coming in for open reduction and internal fixation due to a displaced right ankle fracture. Patient sustained fracture on 07/24/2016 after a mechanical fall. He underwent ORIF of right ankle as well as trimalleolar w/o fixation of posterior malleolus.  Complicated social situation as patient owes rent money and refuses to go to SNF.   Discharge Diagnoses:  Active Problems:   GERD (gastroesophageal reflux disease)   Depression   Uncontrolled diabetes mellitus with diabetic nephropathy, with long-term current use of insulin (HCC)   CKD (chronic kidney disease) stage 3, GFR 30-59 ml/min   Cocaine abuse   CAD (coronary artery disease), native coronary artery   Ankle fracture, right   Surgery, elective    Discharge Instructions  Discharge Instructions    Call MD for:  difficulty breathing, headache or visual disturbances    Complete by:  As directed   Call MD for:  severe uncontrolled pain    Complete by:  As directed   Diet Carb Modified    Complete by:  As directed   Increase activity slowly    Complete by:  As directed   Other Restrictions    Complete by:  As directed   No  weight bearing on post- op extremity       Medication List    STOP taking these medications   glucose 4 GM chewable tablet   glucose monitoring kit monitoring kit   mometasone 0.1 % ointment Commonly known as:  ELOCON     TAKE these medications   acetaminophen 325 MG tablet Commonly known as:  TYLENOL Take 2 tablets (650 mg total) by mouth every 6 (six) hours as needed for mild pain (or Fever >/= 101).   amitriptyline 25 MG tablet Commonly known as:  ELAVIL Take 75 mg by mouth at bedtime. What changed:  Another medication with the same name was added. Make sure you understand how and when to take each.   amitriptyline 75 MG tablet Commonly known as:  ELAVIL Take 1 tablet (75 mg total) by mouth at bedtime. What changed:  You were already taking a medication with the same name, and this prescription was added. Make sure you understand how and when to take each.   amLODipine 5 MG tablet Commonly known as:  NORVASC Take 1 tablet (5 mg total) by mouth daily.   aspirin EC 325 MG tablet Take 1 tablet (325 mg total) by mouth 2 (two) times daily. What changed:  You were already taking a medication with the same name, and this prescription was added. Make sure you understand how and when to take each.   aspirin 81 MG tablet Take 81 mg by mouth daily. What changed:  Another medication with the same name was added. Make sure you understand how and when to take each.   atorvastatin 10 MG  tablet Commonly known as:  LIPITOR Take 1 tablet (10 mg total) by mouth daily.   carvedilol 12.5 MG tablet Commonly known as:  COREG Take 12.5 mg by mouth 2 (two) times daily.   cloNIDine 0.1 MG tablet Commonly known as:  CATAPRES Take 1 tablet (0.1 mg total) by mouth 2 (two) times daily. What changed:  Another medication with the same name was added. Make sure you understand how and when to take each.   cloNIDine 0.1 MG tablet Commonly known as:  CATAPRES Take 1 tablet (0.1 mg total) by  mouth 2 (two) times daily. What changed:  You were already taking a medication with the same name, and this prescription was added. Make sure you understand how and when to take each.   gabapentin 300 MG capsule Commonly known as:  NEURONTIN Take 3 capsules (900 mg total) by mouth 2 (two) times daily.   HUMULIN 70/30 KWIKPEN (70-30) 100 UNIT/ML PEN Generic drug:  Insulin Isophane & Regular Human Inject 0-30 Units into the skin 3 (three) times daily with meals.   hydrALAZINE 25 MG tablet Commonly known as:  APRESOLINE Take 1 tablet (25 mg total) by mouth 3 (three) times daily.   insulin glargine 100 UNIT/ML injection Commonly known as:  LANTUS Inject 0.55 mLs (55 Units total) into the skin 2 (two) times daily.   methocarbamol 750 MG tablet Commonly known as:  ROBAXIN Take 1 tablet (750 mg total) by mouth 2 (two) times daily as needed for muscle spasms. What changed:  medication strength  how much to take  when to take this   omeprazole 20 MG capsule Commonly known as:  PRILOSEC Take 1 capsule (20 mg total) by mouth daily.   ondansetron 4 MG tablet Commonly known as:  ZOFRAN Take 1-2 tablets (4-8 mg total) by mouth every 8 (eight) hours as needed for nausea or vomiting.   oxyCODONE 10 mg 12 hr tablet Commonly known as:  OXYCONTIN Take 1 tablet (10 mg total) by mouth every 12 (twelve) hours.   oxyCODONE-acetaminophen 5-325 MG tablet Commonly known as:  PERCOCET Take 1-2 tablets by mouth every 4 (four) hours as needed for severe pain.   senna-docusate 8.6-50 MG tablet Commonly known as:  SENOKOT S Take 1 tablet by mouth at bedtime as needed.   sertraline 100 MG tablet Commonly known as:  ZOLOFT Take 1.5 tablets (150 mg total) by mouth daily.   traMADol 50 MG tablet Commonly known as:  ULTRAM Take 1 tablet (50 mg total) by mouth every 12 (twelve) hours as needed. What changed:  reasons to take this      Follow-up Information    Marianna Payment, MD Follow  up in 2 week(s).   Specialty:  Orthopedic Surgery Why:  For suture removal, For wound re-check Contact information: [place] [address] [place] Follow up on 08/12/2016.   Why:  Transitional [facility] Clinic appointment on 08/12/16 at 10:00 am with Dr. Jarold Song. Contact information: [place] [address] [phone]         Allergies  Allergen Reactions  . Angiotensin Receptor Blockers Other (See Comments)    (Angioedema with lisinopril, therefore ARB's are contraindicated)  . Lisinopril Other (See Comments)    Angioedema  . Pamelor [Nortriptyline Hcl]     Swelling     Consultations:  Hospitalists   Procedures/Studies: Dg Tibia/fibula Right  Result Date:  07/24/2016 CLINICAL DATA:  Tripped and fell today. Right ankle swelling and limited range of motion. EXAM: RIGHT TIBIA AND FIBULA - 2 VIEW COMPARISON:  Ankle radiographs from the same day. FINDINGS: Trimalleolar the comminuted trimalleolar fracture-dislocation is again noted at the ankle. There is posterior subluxation of the talus. More proximal tibia and fibula are intact. The knee is remarkable for degenerative change. No acute abnormalities present at the knee. IMPRESSION: 1. Comminuted trimalleolar fracture dislocation of the right ankle. This is described in greater detail in the report of the ankle radiographs. 2. No significant proximal injury. Electronically Signed   By: San Morelle M.D.   On: 07/24/2016 12:28   Dg Ankle 2 Views Right  Result Date: 07/24/2016 CLINICAL DATA:  Post reduction EXAM: RIGHT ANKLE - 2 VIEW COMPARISON:  08/03/2016 FINDINGS: Fine detail obscured by cast material. There is bi malleolar fracture again demonstrated. Posterior fracture not well demonstrated. There is improved alignment of the ankle mortise. There remains lateral subluxation of the talus in relation to the tibia. The  medial malleolus is displaced inward to the medial ankle mortise IMPRESSION: Minimal improvement in alignment following splinting. Trimalleolar fracture. Electronically Signed   By: Suzy Bouchard M.D.   On: 07/24/2016 15:25   Dg Ankle Complete Right  Result Date: 07/30/2016 CLINICAL DATA:  Elective surgery. EXAM: DG C-ARM 61-120 MIN; RIGHT ANKLE - COMPLETE 3+ VIEW COMPARISON:  CT from 6 days ago FINDINGS: Relocated ankle joint with medial malleolus and distal fibula fracture repair. Physiologic alignment of posterior malleolus fracture. Small nondisplaced posterior talus fracture on previous CT is not visualized. IMPRESSION: 1. Fluoroscopy for ankle fracture repair.  No unexpected finding. 2. Nondisplaced posterior talus fracture on previous CT is not visible. Electronically Signed   By: Monte Fantasia M.D.   On: 07/30/2016 15:17   Dg Ankle Complete Right  Result Date: 07/24/2016 CLINICAL DATA:  Tripped and fell today. Right ankle swelling and limited range of motion. EXAM: RIGHT ANKLE - COMPLETE 3+ VIEW COMPARISON:  Tibia and fibula films from the same day. FINDINGS: There is a fracture dislocation of the distal ankle. An avulsion fractures present at the medial malleolus. There is an oblique fracture through the distal fibula compatible with an eversion injury. The tibiotalar distance is exaggerated. A posterior tibial fracture is present as well. The talus is subluxed posteriorly. Extensive soft tissue swelling is present. Calcaneal spurs are noted. Degenerative changes are present in the hindfoot. IMPRESSION: 1. Comminuted trimalleolar fracture dislocation of the ankle as described. Electronically Signed   By: San Morelle M.D.   On: 07/24/2016 12:26   Ct Ankle Right Wo Contrast  Result Date: 07/24/2016 CLINICAL DATA:  Right ankle fracture. Trip and fall with right ankle pain. EXAM: CT OF THE RIGHT ANKLE WITHOUT CONTRAST TECHNIQUE: Multidetector CT imaging of the right ankle was performed  according to the standard protocol. Multiplanar CT image reconstructions were also generated. COMPARISON:  Radiographs earlier this day FINDINGS: Displaced trimalleolar fracture. Transverse fracture through the medial malleolus is minimally comminuted. Distal component remains aligned with the talus. There is a 6 mm lateral subluxation of the main tibial shaft with respect to the distal fragment. Small posterior tibial fracture component is displaced 8 mm posteriorly. The tibial plafond disease subluxed anteriorly with respect to the talar dome. Comminuted primarily oblique displaced distal fibular fracture at and just proximal to the ankle mortise. Multiple small fracture fragments at the fracture plane. Additionally there is a displaced fracture of the anterior lateral tibia  with displacement of a 13 mm fragment laterally. This may be an avulsion injury from the anterior syndesmotic attachment. There is no evidence of flexor tendon entrapment. Mild thickening of perennial brevis tendon at the fibular fracture site, with minimal lateral subluxation in the fibular groove. Extensor tendons are intact. There is diffuse soft tissue edema. IMPRESSION: 1. Displaced trimalleolar fracture with anterior subluxation of the tibial plafond with respect to the talar dome. 2. Additionally there is a displaced fracture from the anterior lateral tibia. Electronically Signed   By: Jeb Levering M.D.   On: 07/24/2016 18:57   US Renal  Result Date: 07/03/2016 CLINICAL DATA:  Elevated creatinine, acute kidney injury. EXAM: RENAL / URINARY TRACT ULTRASOUND COMPLETE COMPARISON:  06/26/2016 FINDINGS: Right Kidney: Length: 11.1 cm. Echogenicity within normal limits. No mass or hydronephrosis visualized. Left Kidney: Length: 11.1 cm. Echogenicity within normal limits. No mass or hydronephrosis visualized. 1.2 by 1.2 by 1.0 cm left mid kidney simple appearing cyst. 0.8 by 0.8 by 0.6 cm left kidney lower pole hypoechoic lesion  compatible with cyst. Bladder: Appears normal for degree of bladder distention. IMPRESSION: 1. Several small left renal cyst but otherwise normal sonographic appearance of the kidneys in urinary bladder. Electronically Signed   By: Van Clines M.D.   On: 07/03/2016 14:43   Dg C-arm 61-120 Min  Result Date: 07/30/2016 CLINICAL DATA:  Elective surgery. EXAM: DG C-ARM 61-120 MIN; RIGHT ANKLE - COMPLETE 3+ VIEW COMPARISON:  CT from 6 days ago FINDINGS: Relocated ankle joint with medial malleolus and distal fibula fracture repair. Physiologic alignment of posterior malleolus fracture. Small nondisplaced posterior talus fracture on previous CT is not visualized. IMPRESSION: 1. Fluoroscopy for ankle fracture repair.  No unexpected finding. 2. Nondisplaced posterior talus fracture on previous CT is not visible. Electronically Signed   By: Monte Fantasia M.D.   On: 07/30/2016 15:17       Subjective: Patient is frustrated today.  He states that he absolutely must be discharged home and is frustrated that he is being asked repeatedly to not go home.  Both I and the case manager separately spent a good deal of time talking to patient about his plans for success outpatient.  At this time he believes he will be successful discharging home as he will have his friend help empty his apartment before he is evicted.  It was stressed to the patient the importance of non weight bearing on his right lower extremity.  He mentioned he needs refills on his clonidine and amitryptyline but that he needs money for food as well.  He was encouraged to go to [place] for assistance in his chronic medications and told he would likely not get assistance with his narcotics.    Discharge Exam: Vitals:   07/31/16 2352 08/01/16 0600  BP: 121/64 136/75  Pulse:  76  Resp:  18  Temp:  98.5 F (36.9 C)   Vitals:   07/31/16 1348 07/31/16 2255 07/31/16 2352 08/01/16 0600  BP: 125/73 (!) 161/138 121/64  136/75  Pulse: 79 68  76  Resp: '17 18  18  ' Temp: 98.7 F (37.1 C) 98.4 F (36.9 C)  98.5 F (36.9 C)  TempSrc: Oral Oral  Oral  SpO2: 93% 100%  98%  Weight:    99.9 kg (220 lb 3.2 oz)  Height:        General: Pt is alert, awake, not in acute distress Cardiovascular: RRR, S1/S2 +, no rubs, no gallops Respiratory: CTA  bilaterally, no wheezing, no rhonchi Abdominal: Soft, NT, ND, bowel sounds + Extremities: no edema, no cyanosis, bandage over lower right leg.  Toes are neurovascularly intact bilaterally, patient able to move toes bilaterally.    The results of significant diagnostics from this hospitalization (including imaging, microbiology, ancillary and laboratory) are listed below for reference.     Microbiology: No results found for this or any previous visit (from the past 240 hour(s)).   Labs: BNP (last 3 results)  Recent Labs  07/01/16 1216  BNP 628.3*   Basic Metabolic Panel:  Recent Labs Lab 07/30/16 1048 07/30/16 1915 07/31/16 0447  NA 129* 131* 132*  K 3.8 3.5 3.7  CL 95* 94* 95*  CO2 '26 29 29  ' GLUCOSE 275* 267* 199*  BUN '11 13 11  ' CREATININE 1.44* 1.46* 1.42*  CALCIUM 8.9 8.3* 8.1*   Liver Function Tests:  Recent Labs Lab 07/30/16 1048 07/30/16 1915  AST 40 38  ALT 48 42  ALKPHOS 65 66  BILITOT 0.7 0.7  PROT 7.7 7.6  ALBUMIN 3.1* 2.9*   No results for input(s): LIPASE, AMYLASE in the last 168 hours. No results for input(s): AMMONIA in the last 168 hours. CBC:  Recent Labs Lab 07/30/16 1048 07/31/16 0447  WBC 7.3 9.1  HGB 11.4* 10.9*  HCT 33.1* 32.5*  MCV 94.3 94.8  PLT 169 168   Cardiac Enzymes: No results for input(s): CKTOTAL, CKMB, CKMBINDEX, TROPONINI in the last 168 hours. BNP: Invalid input(s): POCBNP CBG:  Recent Labs Lab 07/31/16 1129 07/31/16 1633 07/31/16 2119 08/01/16 0559 08/01/16 1239  GLUCAP 250* 231* 190* 182* 154*   D-Dimer No results for input(s): DDIMER in the last 72 hours. Hgb A1c No results  for input(s): HGBA1C in the last 72 hours. Lipid Profile No results for input(s): CHOL, HDL, LDLCALC, TRIG, CHOLHDL, LDLDIRECT in the last 72 hours. Thyroid function studies No results for input(s): TSH, T4TOTAL, T3FREE, THYROIDAB in the last 72 hours.  Invalid input(s): FREET3 Anemia work up No results for input(s): VITAMINB12, FOLATE, FERRITIN, TIBC, IRON, RETICCTPCT in the last 72 hours. Urinalysis    Component Value Date/Time   COLORURINE AMBER (A) 07/03/2016 2125   APPEARANCEUR CLEAR 07/03/2016 2125   LABSPEC 1.022 07/03/2016 2125   PHURINE 5.5 07/03/2016 2125   GLUCOSEU NEGATIVE 07/03/2016 2125   HGBUR NEGATIVE 07/03/2016 2125   BILIRUBINUR NEGATIVE 07/03/2016 2125   KETONESUR NEGATIVE 07/03/2016 2125   PROTEINUR 30 (A) 07/03/2016 2125   UROBILINOGEN 1.0 04/26/2012 1328   NITRITE NEGATIVE 07/03/2016 2125   LEUKOCYTESUR NEGATIVE 07/03/2016 2125   Sepsis Labs Invalid input(s): PROCALCITONIN,  WBC,  LACTICIDVEN Microbiology No results found for this or any previous visit (from the past 240 hour(s)).   Time coordinating discharge: Over 30 minutes  SIGNED:   Newman Pies, MD  [facility] 08/01/2016, 2:55 PM Pager [phone] If 7PM-7AM, please contact night-coverage [email or web address] Password [email or web address]

## 2016-08-01 NOTE — Progress Notes (Signed)
Physical Therapy Treatment Patient Details Name: Brad [name] MRN: [id number] DOB: [birth year] Today's Date: 08/01/2016    History of Present Illness Pt is a 55 y/o male s/p ORIF for R trimalleolar fx. PMH including but not limited to CHF, CKD, DM, Hep C and HTN.    PT Comments    Pt presented supine in bed with HOB elevated, awake and willing to participate in therapy session. Pt making good progress towards achieving his goals. He increased his distance ambulated this session. PT again demonstrating and instructing pt in safe technique to ascend and descend one step with use of a stable chair to sit on and then pivot into his home. Pt verbally expressed understanding. Therapist still felt it was unsafe for pt to attempt to stair train via hopping up backwards with use of RW.   PT recommending [place] [facility] PT services and 24 hour assistance/supervision when pt d/c's home. Pt would continue to benefit from skilled physical therapy services at this time while admitted and after d/c to address his limitations in order to improve his overall safety and independence with functional mobility.    Follow Up Recommendations  Home health PT;Supervision/Assistance - 24 hour     Equipment Recommendations  3in1 (PT);Wheelchair (measurements PT);Wheelchair cushion (measurements PT);Other (comment)    Recommendations for Other Services       Precautions / Restrictions Precautions Precautions: Fall Restrictions Weight Bearing Restrictions: Yes RLE Weight Bearing: Non weight bearing    Mobility  Bed Mobility Overal bed mobility: Needs Assistance Bed Mobility: Supine to Sit     Supine to sit: Supervision;HOB elevated     General bed mobility comments: pt required increased time to complete  Transfers Overall transfer level: Needs assistance Equipment used: Rolling walker (2 wheeled) Transfers: Sit to/from Stand Sit to Stand: Min assist         General transfer comment: pt able to maintain  NWB R LE status throughout transfer independently  Ambulation/Gait Ambulation/Gait assistance: Min guard Ambulation Distance (Feet): 100 Feet (100 ft x2 with sitting rest break in between) Assistive device: Rolling walker (2 wheeled) Gait Pattern/deviations: Step-to pattern (hop-to to maintain NWB R LE status) Gait velocity: decreased Gait velocity interpretation: Below normal speed for age/gender     Stairs            Wheelchair Mobility    Modified [name] (Stroke Patients Only)       Balance Overall balance assessment: Needs assistance Sitting-balance support: Feet supported;No upper extremity supported Sitting balance-Leahy Scale: Fair     Standing balance support: Bilateral upper extremity supported Standing balance-Leahy Scale: Poor                      Cognition Arousal/Alertness: Awake/alert Behavior During Therapy: WFL for tasks assessed/performed Overall Cognitive Status: No family/caregiver present to determine baseline cognitive functioning                 General Comments: pt was oriented to person, place and situation and was much more alert during this session.     Exercises      General Comments        Pertinent Vitals/Pain Pain Assessment: No/denies pain Pain Intervention(s): Monitored during session    Home Living                      Prior Function            PT Goals (current goals can now be found in the  care plan section) Acute Rehab PT Goals Patient Stated Goal: return home PT Goal Formulation: With patient Time For Goal Achievement: 08/07/16 Potential to Achieve Goals: Good Progress towards PT goals: Progressing toward goals    Frequency  7X/week    PT Plan Current plan remains appropriate    Co-evaluation             End of Session Equipment Utilized During Treatment: Gait belt Activity Tolerance: Patient limited by fatigue Patient left: in chair;with call bell/phone within reach      Time: 0930-0950 PT Time Calculation (min) (ACUTE ONLY): 20 min  Charges:  $Gait Training: 8-22 mins                    G CodesClearnce Sorrel Maiah Sinning 08/10/2016, 11:44 AM Sherie Don, PT, DPT [phone]

## 2016-08-01 NOTE — Hospital Discharge Follow-Up (Signed)
Transitional Care Clinic at [place]:  Patient known to the [facility] [place]. This Case Manager met with patient at bedside to discuss plan for post-discharge follow-up. Patient agreeable to follow-up with the [facility] after discharge, and an appointment was scheduled for 08/12/16 at 1000 with Dr. Jarold Song. Appointment on AVS. Patient informed this Case Manager he is out of minutes on his phone but indicated his phone able to receive text messages. Contact number for patient is [phone]. Patient also indicated he will need transportation to his upcoming appointment. Will notify Clinic Scheduler of patient's transportation needs.   Patient indicated he is stressed because he is two months behind on rent and will be evicted from his apartment. Informed patient he will benefit from going to the [facility]) once discharged to address his housing situation. Patient verbalized understanding.  Inpatient Case Manager has also provided patient with contact numbers for local churches for assistance with rent. Patient has a walker at home, and inpatient Case Manager has contacted [place] for a wheelchair and 3n1 for patient. Will continue to follow patient's clinical progress.

## 2016-08-04 MED FILL — AMITRIPTYLINE HCL 75 MG TAB: 75 | 30 days supply | Qty: 30 | Fill #0

## 2016-08-05 ENCOUNTER — Telehealth: Payer: Self-pay

## 2016-08-05 NOTE — Telephone Encounter (Signed)
Transitional Care Clinic Post-discharge Follow-Up Phone Call:  Date of Discharge: 08/01/2016 Principal Discharge Diagnosis(es): s/p ORIF rigth ankle,DM with diabetic neuropathy, CKD - 3, CAD, cocaine abuse Post-discharge Communication: (Clearly document all attempts clearly and date contact made) Call placed to # [phone] (M) and a HIPAA compliant voicemail message was left requesting a call back to # [phone] or [phone]. The patient had informed Janett Billow beck RN CM that he may be out of minutes on his phone and requested a text message.  This CM sent a text message from # [phone] to # [phone]  and requested that he call this CM back.  Call Completed: No

## 2016-08-06 ENCOUNTER — Telehealth: Payer: Self-pay

## 2016-08-06 NOTE — Telephone Encounter (Signed)
Transitional Care Clinic Post-discharge Follow-Up Phone Call:  Date of Discharge: 08/01/16 Principal Discharge Diagnosis(es): s/p ORIF of right ankle, DM with diabetic neuropathy, CKD-Stage 3, CAD Post-discharge Communication: Attempt #2 to reach patient and complete discharge follow-up phone call. Call placed to [phone]; unable to reach patient. HIPPA compliant voicemail left requesting return call.  Call Completed: No

## 2016-08-07 ENCOUNTER — Telehealth: Payer: Self-pay
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
## 2016-08-08 ENCOUNTER — Telehealth: Payer: Self-pay

## 2016-08-08 NOTE — Telephone Encounter (Signed)
This Case Manager placed call to patient to check on status and to determine if his friend picked up food box from [employer] on 08/07/16. Call placed to [phone]; unable to reach patient. HIPPA compliant voicemail left requesting return call.
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
## 2016-08-11 ENCOUNTER — Telehealth: Payer: Self-pay

## 2016-08-11 ENCOUNTER — Emergency Department (HOSPITAL_COMMUNITY): Payer: Medicaid Other

## 2016-08-11 ENCOUNTER — Encounter (HOSPITAL_COMMUNITY): Payer: Self-pay | Admitting: Emergency Medicine

## 2016-08-11 ENCOUNTER — Observation Stay (HOSPITAL_COMMUNITY)
Admission: EM | Admit: 2016-08-11 | Discharge: 2016-08-13 | Disposition: A | Discharge status: Home / Self Care | Payer: Medicaid Other | Attending: Internal Medicine | Admitting: Internal Medicine

## 2016-08-11 DIAGNOSIS — IMO0002 Reserved for concepts with insufficient information to code with codable children: Secondary | ICD-10-CM

## 2016-08-11 DIAGNOSIS — E1140 Type 2 diabetes mellitus with diabetic neuropathy, unspecified: Secondary | ICD-10-CM | POA: Diagnosis not present

## 2016-08-11 DIAGNOSIS — E119 Type 2 diabetes mellitus without complications: Secondary | ICD-10-CM

## 2016-08-11 DIAGNOSIS — I252 Old myocardial infarction: Secondary | ICD-10-CM | POA: Insufficient documentation

## 2016-08-11 DIAGNOSIS — M79606 Pain in leg, unspecified: Secondary | ICD-10-CM

## 2016-08-11 DIAGNOSIS — R079 Chest pain, unspecified: Principal | ICD-10-CM | POA: Insufficient documentation

## 2016-08-11 DIAGNOSIS — I2510 Atherosclerotic heart disease of native coronary artery without angina pectoris: Secondary | ICD-10-CM | POA: Insufficient documentation

## 2016-08-11 DIAGNOSIS — Z7982 Long term (current) use of aspirin: Secondary | ICD-10-CM | POA: Diagnosis not present

## 2016-08-11 DIAGNOSIS — I5032 Chronic diastolic (congestive) heart failure: Secondary | ICD-10-CM | POA: Diagnosis present

## 2016-08-11 DIAGNOSIS — M79604 Pain in right leg: Secondary | ICD-10-CM | POA: Diagnosis not present

## 2016-08-11 DIAGNOSIS — E1121 Type 2 diabetes mellitus with diabetic nephropathy: Secondary | ICD-10-CM | POA: Insufficient documentation

## 2016-08-11 DIAGNOSIS — Z794 Long term (current) use of insulin: Secondary | ICD-10-CM | POA: Diagnosis not present

## 2016-08-11 DIAGNOSIS — F17210 Nicotine dependence, cigarettes, uncomplicated: Secondary | ICD-10-CM | POA: Diagnosis not present

## 2016-08-11 DIAGNOSIS — N183 Chronic kidney disease, stage 3 unspecified: Secondary | ICD-10-CM | POA: Diagnosis present

## 2016-08-11 DIAGNOSIS — I5033 Acute on chronic diastolic (congestive) heart failure: Secondary | ICD-10-CM | POA: Insufficient documentation

## 2016-08-11 DIAGNOSIS — E1165 Type 2 diabetes mellitus with hyperglycemia: Secondary | ICD-10-CM

## 2016-08-11 DIAGNOSIS — E118 Type 2 diabetes mellitus with unspecified complications: Secondary | ICD-10-CM

## 2016-08-11 DIAGNOSIS — I130 Hypertensive heart and chronic kidney disease with heart failure and stage 1 through stage 4 chronic kidney disease, or unspecified chronic kidney disease: Secondary | ICD-10-CM | POA: Insufficient documentation

## 2016-08-11 DIAGNOSIS — Z96652 Presence of left artificial knee joint: Secondary | ICD-10-CM | POA: Insufficient documentation

## 2016-08-11 DIAGNOSIS — Z955 Presence of coronary angioplasty implant and graft: Secondary | ICD-10-CM | POA: Diagnosis not present

## 2016-08-11 DIAGNOSIS — I10 Essential (primary) hypertension: Secondary | ICD-10-CM | POA: Diagnosis present

## 2016-08-11 DIAGNOSIS — Z79899 Other long term (current) drug therapy: Secondary | ICD-10-CM | POA: Insufficient documentation

## 2016-08-11 DIAGNOSIS — R1011 Right upper quadrant pain: Secondary | ICD-10-CM

## 2016-08-11 DIAGNOSIS — R071 Chest pain on breathing: Secondary | ICD-10-CM | POA: Diagnosis present

## 2016-08-11 HISTORY — DX: Chest pain, unspecified: R07.9

## 2016-08-11 LAB — URINE MICROSCOPIC-ADD ON: RBC / HPF: NONE SEEN RBC/hpf (ref 0–5)

## 2016-08-11 LAB — RAPID URINE DRUG SCREEN, HOSP PERFORMED
Amphetamines: NOT DETECTED
Barbiturates: NOT DETECTED
Benzodiazepines: NOT DETECTED
Cocaine: NOT DETECTED
Opiates: POSITIVE — AB
Tetrahydrocannabinol: NOT DETECTED

## 2016-08-11 LAB — CBC WITH DIFFERENTIAL/PLATELET
Basophils Absolute: 0.1 10*3/uL (ref 0.0–0.1)
Basophils Relative: 1 %
Eosinophils Absolute: 0.2 10*3/uL (ref 0.0–0.7)
Eosinophils Relative: 2 %
HCT: 37 % — ABNORMAL LOW (ref 39.0–52.0)
Hemoglobin: 12.6 g/dL — ABNORMAL LOW (ref 13.0–17.0)
Lymphocytes Relative: 27 %
Lymphs Abs: 1.9 10*3/uL (ref 0.7–4.0)
MCH: 31.9 pg (ref 26.0–34.0)
MCHC: 34.1 g/dL (ref 30.0–36.0)
MCV: 93.7 fL (ref 78.0–100.0)
Monocytes Absolute: 0.6 10*3/uL (ref 0.1–1.0)
Monocytes Relative: 8 %
Neutro Abs: 4.3 10*3/uL (ref 1.7–7.7)
Neutrophils Relative %: 62 %
Platelets: 315 10*3/uL (ref 150–400)
RBC: 3.95 MIL/uL — ABNORMAL LOW (ref 4.22–5.81)
RDW: 12 % (ref 11.5–15.5)
WBC: 6.9 10*3/uL (ref 4.0–10.5)

## 2016-08-11 LAB — URINALYSIS, ROUTINE W REFLEX MICROSCOPIC
Bilirubin Urine: NEGATIVE
Glucose, UA: 100 mg/dL — AB
Hgb urine dipstick: NEGATIVE
Ketones, ur: NEGATIVE mg/dL
Leukocytes, UA: NEGATIVE
Nitrite: NEGATIVE
Protein, ur: 100 mg/dL — AB
Specific Gravity, Urine: 1.015 (ref 1.005–1.030)
pH: 5.5 (ref 5.0–8.0)

## 2016-08-11 LAB — I-STAT CHEM 8, ED
BUN: 16 mg/dL (ref 6–20)
Calcium, Ion: 1.15 mmol/L (ref 1.13–1.30)
Chloride: 98 mmol/L — ABNORMAL LOW (ref 101–111)
Creatinine, Ser: 1.6 mg/dL — ABNORMAL HIGH (ref 0.61–1.24)
Glucose, Bld: 278 mg/dL — ABNORMAL HIGH (ref 65–99)
HCT: 40 % (ref 39.0–52.0)
Hemoglobin: 13.6 g/dL (ref 13.0–17.0)
Potassium: 3.9 mmol/L (ref 3.5–5.1)
Sodium: 136 mmol/L (ref 135–145)
TCO2: 24 mmol/L (ref 0–100)

## 2016-08-11 LAB — D-DIMER, QUANTITATIVE: D-Dimer, Quant: 2.19 ug/mL-FEU — ABNORMAL HIGH (ref 0.00–0.50)

## 2016-08-11 LAB — MRSA PCR SCREENING: MRSA by PCR: NEGATIVE

## 2016-08-11 LAB — I-STAT TROPONIN, ED
Troponin i, poc: 0.02 ng/mL (ref 0.00–0.08)
Troponin i, poc: 0.03 ng/mL (ref 0.00–0.08)

## 2016-08-11 LAB — TROPONIN I: Troponin I: 0.04 ng/mL (ref <0.03)

## 2016-08-11 LAB — GLUCOSE, CAPILLARY: Glucose-Capillary: 293 mg/dL — ABNORMAL HIGH (ref 65–99)

## 2016-08-11 MED ORDER — ASPIRIN EC 81 MG PO TBEC: 81.0000 mg | DELAYED_RELEASE_TABLET | Freq: Every day | ORAL | Status: DC

## 2016-08-11 MED ORDER — ONDANSETRON HCL 4 MG/2ML IJ SOLN: 4.0000 mg | Freq: Three times a day (TID) | INTRAMUSCULAR | Status: DC | PRN

## 2016-08-11 MED ORDER — ATORVASTATIN CALCIUM 10 MG PO TABS
10.0000 mg | ORAL_TABLET | Freq: Every day | ORAL | Status: DC
  Administered 2016-08-12 – 2016-08-13 (×2): 10 mg via ORAL
  Filled 2016-08-11 (×2): qty 1

## 2016-08-11 MED ORDER — ENOXAPARIN SODIUM 40 MG/0.4ML ~~LOC~~ SOLN
40.0000 mg | Freq: Every day | SUBCUTANEOUS | Status: DC
  Administered 2016-08-12: 40 mg via SUBCUTANEOUS
  Filled 2016-08-11 (×2): qty 0.4

## 2016-08-11 MED ORDER — INSULIN ASPART 100 UNIT/ML ~~LOC~~ SOLN
0.0000 [IU] | Freq: Three times a day (TID) | SUBCUTANEOUS | Status: DC
  Administered 2016-08-12: 2 [IU] via SUBCUTANEOUS
  Administered 2016-08-12 (×2): 3 [IU] via SUBCUTANEOUS
  Administered 2016-08-13: 5 [IU] via SUBCUTANEOUS
  Administered 2016-08-13: 3 [IU] via SUBCUTANEOUS

## 2016-08-11 MED ORDER — SENNOSIDES-DOCUSATE SODIUM 8.6-50 MG PO TABS: 1.0000 | ORAL_TABLET | Freq: Every evening | ORAL | Status: DC | PRN

## 2016-08-11 MED ORDER — OXYCODONE-ACETAMINOPHEN 5-325 MG PO TABS
1.0000 | ORAL_TABLET | Freq: Once | ORAL | Status: AC
  Administered 2016-08-11: 1 via ORAL
  Filled 2016-08-11: qty 1

## 2016-08-11 MED ORDER — IOPAMIDOL (ISOVUE-370) INJECTION 76%
INTRAVENOUS | Status: AC
  Administered 2016-08-11: 100 mL
  Filled 2016-08-11: qty 100

## 2016-08-11 MED ORDER — INSULIN GLARGINE 100 UNIT/ML ~~LOC~~ SOLN
55.0000 [IU] | Freq: Two times a day (BID) | SUBCUTANEOUS | Status: DC
  Administered 2016-08-11 – 2016-08-12 (×2): 55 [IU] via SUBCUTANEOUS
  Filled 2016-08-11 (×3): qty 0.55

## 2016-08-11 MED ORDER — AMLODIPINE BESYLATE 5 MG PO TABS
5.0000 mg | ORAL_TABLET | Freq: Every day | ORAL | Status: DC
  Administered 2016-08-12 – 2016-08-13 (×2): 5 mg via ORAL
  Filled 2016-08-11 (×2): qty 1

## 2016-08-11 MED ORDER — ACETAMINOPHEN 325 MG PO TABS: 650.0000 mg | ORAL_TABLET | ORAL | Status: DC | PRN

## 2016-08-11 MED ORDER — MORPHINE SULFATE (PF) 2 MG/ML IV SOLN
2.0000 mg | Freq: Once | INTRAVENOUS | Status: AC
  Administered 2016-08-11: 2 mg via INTRAVENOUS
  Filled 2016-08-11: qty 1

## 2016-08-11 MED ORDER — METHOCARBAMOL 750 MG PO TABS
750.0000 mg | ORAL_TABLET | Freq: Two times a day (BID) | ORAL | Status: DC | PRN
  Administered 2016-08-12: 750 mg via ORAL
  Filled 2016-08-11: qty 1

## 2016-08-11 MED ORDER — NITROGLYCERIN 0.4 MG SL SUBL
0.4000 mg | SUBLINGUAL_TABLET | SUBLINGUAL | Status: DC | PRN
  Administered 2016-08-11: 0.4 mg via SUBLINGUAL
  Filled 2016-08-11: qty 1

## 2016-08-11 MED ORDER — SODIUM CHLORIDE 0.9 % IV SOLN
INTRAVENOUS | Status: DC
  Administered 2016-08-11: 13:00:00 via INTRAVENOUS

## 2016-08-11 MED ORDER — CARVEDILOL 12.5 MG PO TABS
12.5000 mg | ORAL_TABLET | Freq: Two times a day (BID) | ORAL | Status: DC
  Administered 2016-08-12 – 2016-08-13 (×4): 12.5 mg via ORAL
  Filled 2016-08-11 (×4): qty 1

## 2016-08-11 MED ORDER — GABAPENTIN 300 MG PO CAPS
900.0000 mg | ORAL_CAPSULE | Freq: Two times a day (BID) | ORAL | Status: DC
  Administered 2016-08-11 – 2016-08-13 (×4): 900 mg via ORAL
  Filled 2016-08-11 (×4): qty 3

## 2016-08-11 MED ORDER — ONDANSETRON HCL 4 MG PO TABS: 4.0000 mg | ORAL_TABLET | Freq: Three times a day (TID) | ORAL | Status: DC | PRN

## 2016-08-11 MED ORDER — AMITRIPTYLINE HCL 25 MG PO TABS
75.0000 mg | ORAL_TABLET | Freq: Every day | ORAL | Status: DC
  Administered 2016-08-11 – 2016-08-12 (×2): 75 mg via ORAL
  Filled 2016-08-11 (×2): qty 3

## 2016-08-11 MED ORDER — PANTOPRAZOLE SODIUM 40 MG PO TBEC
40.0000 mg | DELAYED_RELEASE_TABLET | Freq: Every day | ORAL | Status: DC
  Administered 2016-08-12 – 2016-08-13 (×2): 40 mg via ORAL
  Filled 2016-08-11 (×2): qty 1

## 2016-08-11 MED ORDER — SODIUM CHLORIDE 0.9 % IV BOLUS (SEPSIS)
500.0000 mL | Freq: Once | INTRAVENOUS | Status: AC
  Administered 2016-08-11: 500 mL via INTRAVENOUS

## 2016-08-11 MED ORDER — HYDRALAZINE HCL 25 MG PO TABS
25.0000 mg | ORAL_TABLET | Freq: Three times a day (TID) | ORAL | Status: DC
  Administered 2016-08-11 – 2016-08-13 (×6): 25 mg via ORAL
  Filled 2016-08-11 (×6): qty 1

## 2016-08-11 MED ORDER — ONDANSETRON HCL 4 MG/2ML IJ SOLN: 4.0000 mg | Freq: Four times a day (QID) | INTRAMUSCULAR | Status: DC | PRN

## 2016-08-11 MED ORDER — SERTRALINE HCL 50 MG PO TABS
150.0000 mg | ORAL_TABLET | Freq: Every day | ORAL | Status: DC
  Administered 2016-08-12 – 2016-08-13 (×2): 150 mg via ORAL
  Filled 2016-08-11 (×2): qty 1

## 2016-08-11 MED ORDER — CLONIDINE HCL 0.1 MG PO TABS
0.1000 mg | ORAL_TABLET | Freq: Two times a day (BID) | ORAL | Status: DC
  Administered 2016-08-11 – 2016-08-13 (×4): 0.1 mg via ORAL
  Filled 2016-08-11 (×4): qty 1
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
## 2016-08-11 NOTE — ED Notes (Signed)
Patient calls out and states "chest pain is back, 9/10."
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
## 2016-08-11 NOTE — ED Triage Notes (Signed)
Cp started at 6 am 10/10 was sweaty upon ems arriva states sob and felt like pressure has iv 22 GOT 4 MORPHINE, 2 nitro cbg was 236 pain now down  9/10. Pt had dried up now swaety again

## 2016-08-11 NOTE — Hospital Discharge Follow-Up (Signed)
Discussed the patient's current housing status and possible eligibility for medicaid with Joellyn Quails, RN CM.  Inquired if the patient could be seen by the ED SW.   Met with the patient this afternoon. He stated that he has no idea where he will go after discharge. He said that he doesn't have his walker but has 7 days to go back into the apartment to obtain his belongings; but needs the apartment manager to let him into the apartment. He then mentioned a friend in [place], [place] who has a shelter where he may be able to go however, he is not sure how he would get to his medical appointments in [place]. He said that he will need to ask the woman at the shelter about his transportation needs. Encouraged him to call the woman this afternoon.   He said that he did not think that he needs the # for legal aide at this time. He noted that he is working with an attorney to appeal his disability denial.

## 2016-08-11 NOTE — ED Notes (Signed)
Ct called and new iv started 20 in ac left

## 2016-08-11 NOTE — ED Notes (Signed)
Patient states he received 324 ASA per EMS.
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
## 2016-08-11 NOTE — Telephone Encounter (Signed)
Attempted to contact Donalee Citrin, DSS caseworker # [phone] regarding the home visit that was to be made to assess the patient's eligibility for medicaid and food stamps. Voicemail message left requesting a call back to # [phone] or [phone].

## 2016-08-11 NOTE — ED Notes (Signed)
Called report, patient still having chest pain. Awaiting different bed placement.

## 2016-08-11 NOTE — Telephone Encounter (Addendum)
Call received from the patient. He said that he is not sure that he would stay with his friend in [place] because he is not sure how he would be able to get to his medical appointments in [place].  He requested that this CM call [employer] [facility] to inquire about bed status for him.  Call placed to [facility] # [phone] and spoke to Daryel Gerald, Health visitor. He said that there are not any beds available at this time. It would be best to check back in the morning to see if there are any openings for tomorrow. He said that he may know about openings as early at 1000.  Call placed to the [facility] # [phone] and spoke to Theadora Rama, Health visitor regarding bed availability.  She stated that the individual needs to come to the shelter to complete an application however, they are booked for applications until [id number].  Call placed to the [facility] # [phone] and left a message for supervisor, Will requesting a call back to # [phone] to  discuss bed availability.   Call placed to the patient and informed him of the information received from the shelters. He was appreciative of the information and then informed this CM that he is being admitted.  His appointment with the [facility] has been rescheduled from 08/12/16 to 08/21/16 @ 1130 and the information has been placed on the AVS.

## 2016-08-11 NOTE — Progress Notes (Signed)
Call from [facility] CM, [name] about pt social concerns with living arrangements and need of medicaid and food stamps Updated [facility] ED SW

## 2016-08-11 NOTE — ED Provider Notes (Signed)
The cardiologist, Dr. Marlou Porch, contacted me and wanted the patient admitted to the hospitalist service.  He had a fairly normal-looking cardiac catheterization with the last 60 days.   Leonard Schwartz, MD 08/11/16 [phone]

## 2016-08-11 NOTE — ED Notes (Signed)
Pt able to drink ginger ale cannot void at this time
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
## 2016-08-11 NOTE — ED Notes (Signed)
Attempted report x1. 

## 2016-08-11 NOTE — Progress Notes (Signed)
Pt Has a positive troponin of 0.04 he also states that he has chest pain 8/10. MD paged.  Ferdinand Lango, RN

## 2016-08-12 ENCOUNTER — Telehealth: Payer: Self-pay

## 2016-08-12 ENCOUNTER — Observation Stay (HOSPITAL_COMMUNITY): Payer: Medicaid Other

## 2016-08-12 ENCOUNTER — Encounter (HOSPITAL_COMMUNITY): Payer: Self-pay | Admitting: General Practice

## 2016-08-12 ENCOUNTER — Inpatient Hospital Stay: Payer: Self-pay | Admitting: Family Medicine

## 2016-08-12 DIAGNOSIS — R072 Precordial pain: Secondary | ICD-10-CM

## 2016-08-12 LAB — GLUCOSE, CAPILLARY
Glucose-Capillary: 248 mg/dL — ABNORMAL HIGH (ref 65–99)
Glucose-Capillary: 207 mg/dL — ABNORMAL HIGH (ref 65–99)
Glucose-Capillary: 193 mg/dL — ABNORMAL HIGH (ref 65–99)
Glucose-Capillary: 165 mg/dL — ABNORMAL HIGH (ref 65–99)

## 2016-08-12 LAB — TROPONIN I
Troponin I: 0.04 ng/mL (ref <0.03)
Troponin I: 0.04 ng/mL (ref <0.03)

## 2016-08-12 MED ORDER — INSULIN GLARGINE 100 UNIT/ML ~~LOC~~ SOLN
60.0000 [IU] | Freq: Two times a day (BID) | SUBCUTANEOUS | Status: DC
  Administered 2016-08-12 – 2016-08-13 (×2): 60 [IU] via SUBCUTANEOUS
  Filled 2016-08-12 (×4): qty 0.6

## 2016-08-12 MED ORDER — OXYCODONE HCL 5 MG PO TABS
10.0000 mg | ORAL_TABLET | Freq: Four times a day (QID) | ORAL | Status: DC | PRN
  Administered 2016-08-12 – 2016-08-13 (×3): 10 mg via ORAL
  Filled 2016-08-12 (×3): qty 2

## 2016-08-12 MED ORDER — MORPHINE SULFATE (PF) 2 MG/ML IV SOLN
1.0000 mg | INTRAVENOUS | Status: DC | PRN
  Administered 2016-08-12 (×2): 1 mg via INTRAVENOUS
  Filled 2016-08-12: qty 1

## 2016-08-12 MED ORDER — INSULIN GLARGINE 100 UNIT/ML ~~LOC~~ SOLN
10.0000 [IU] | Freq: Once | SUBCUTANEOUS | Status: AC
  Administered 2016-08-12: 10 [IU] via SUBCUTANEOUS
  Filled 2016-08-12: qty 0.1

## 2016-08-12 MED ORDER — ASPIRIN EC 325 MG PO TBEC
325.0000 mg | DELAYED_RELEASE_TABLET | Freq: Every day | ORAL | Status: DC
  Administered 2016-08-12 – 2016-08-13 (×2): 325 mg via ORAL
  Filled 2016-08-12 (×2): qty 1

## 2016-08-12 MED ORDER — MORPHINE SULFATE (PF) 2 MG/ML IV SOLN
INTRAVENOUS | Status: AC
  Filled 2016-08-12: qty 1

## 2016-08-12 MED ORDER — NITROGLYCERIN IN D5W 200-5 MCG/ML-% IV SOLN
INTRAVENOUS | Status: AC
  Filled 2016-08-12: qty 250

## 2016-08-12 MED ORDER — NITROGLYCERIN IN D5W 200-5 MCG/ML-% IV SOLN
0.0000 ug/min | INTRAVENOUS | Status: DC
  Administered 2016-08-12: 5 ug/min via INTRAVENOUS

## 2016-08-12 NOTE — Clinical Social Work Note (Signed)
Clinical Social Work Assessment  Patient Details  Name: Brad [name] MRN: [id number] Date of Birth: 07/30/1961  Date of referral:  08/12/16               Reason for consult:  Financial Concerns                Permission sought to share information with:    Permission granted to share information::  No  Name::        Agency::     Relationship::     Contact Information:     Housing/Transportation Living arrangements for the past 2 months:  Apartment Source of Information:  Patient, Medical Team, Spouse Patient Interpreter Needed:  None Criminal Activity/Legal Involvement Pertinent to Current Situation/Hospitalization:  No - Comment as needed Significant Relationships:  Dependent Children, Spouse Lives with:  Self Do you feel safe going back to the place where you live?  Yes Need for family participation in patient care:  Yes (Comment)  Care giving concerns:  Patient recently evicted from his apartment. Patient also in need of financial resources.   Social Worker assessment / plan:  CSW met with patient. Wife and son at bedside. CSW introduced role and inquired about resources patient may need. CSW provided resources throughout [place] (financial, food, clothing, legal, etc.). RNCM is working on a shelter for patient. Patient accepted resources. He asked about getting social security. His wife stated that he was too young to get that. CSW encouraged patient to contact DSS for any of those type questions. No further concerns. CSW encouraged patient and his family to contact CSW as needed. CSW will sign off as social work intervention is no longer needed.  Employment status:  Disabled (Comment on whether or not currently receiving Disability) (Able to go back to job when ready.) Insurance information:  Self Pay (Medicaid Pending) PT Recommendations:  Not assessed at this time Information / Referral to community resources:  Other (Comment Required) (Multiple resources  throughout [place])  Patient/Family's Response to care:  Patient accepting of resources. Patient's family supportive and involved in patient's care. Patient appreciated social work intervention.  Patient/Family's Understanding of and Emotional Response to Diagnosis, Current Treatment, and Prognosis:  Patient understands reason for hospitalization and went into detail with CSW about his surgery on his right ankle. Patient appears happy with hospital care.  Emotional Assessment Appearance:  Appears stated age Attitude/Demeanor/Rapport:  Other (Pleasant) Affect (typically observed):  Accepting, Appropriate, Calm, Pleasant Orientation:  Oriented to Self, Oriented to Place, Oriented to  Time, Oriented to Situation Alcohol / Substance use:  Illicit Drugs Psych involvement (Current and /or in the community):  No (Comment)  Discharge Needs  Concerns to be addressed:  Basic Needs, Financial / Insurance Concerns, Homelessness Readmission within the last 30 days:  Yes Current discharge risk:  Inadequate Financial Supports, Homeless, Substance Abuse Barriers to Discharge:  Active Substance Use, Homeless with medical needs, Inadequate or no insurance   Sarah C Boswell, LCSW 08/12/2016, 4:07 PM  

## 2016-08-12 NOTE — Care Management Note (Addendum)
Case Management Note  Patient Details  Name: Corderius Gaska MRN: [id number] Date of Birth: 01/11/1961  Subjective/Objective:   Pt presented for chest pain. Pt recently just got evicted from apartment. Pt is without insurance and he has PCP Dr. Jarold Song with [facility].                  Action/Plan: [facility] Liaison [name] is working with the pt in regards to housing. [name] has called [facility], DSS and [employer] for assistance with housing. [name] to speak with pt today in regards to disposition needs. CSW aware of needs as well. No further needs from CM at this time.   Expected Discharge Date:                  Expected Discharge Plan:  Home/Self Care (Pt recently got evicted [facility] is working with pt in ref to hosuing. )  In-House Referral:  Clinical Social Work, Development worker, community (Hosuign resources)  Discharge planning Services  CM Consult  Post Acute Care Choice:  NA Choice offered to:  NA  DME Arranged:  N/A DME Agency:  NA  HH Arranged:  NA HH Agency:  NA  Status of Service:  Completed, signed off  If discussed at [employer], dates discussed:    Additional Comments: 08-13-16 [address] Jacqlyn Krauss, RN,BSN [phone] CM did return from meeting and now pt is refusing to go to [place]. Pt states he is trying to go home with a friend. Unsure if friend will be able to assist pt with housing at this late time. CM did ask Staff RN to contact ED CSW if further needs arise. No further needs from CM at this time.    08-13-16 [place], [place] [phone] PT recommendations for SNF: Pt is without insurance -per CSW pt is not approved for LOG to send to SNF. CM did call [name] with [facility] to see if still option to get into [facility] CM will order RW for patient. [facility] to supply. Pt was without [facility] prior to arrival. Pt without insurance and ankle will not qualify for [facility] PT services as a self pay per Liaison @ [facility] that provides charity care for  self pay patient's.  CSW did get in contact with [facility] and they will accept patient today. Medications were sent over to the [facility] with [facility] to bring medications over to the patient.  CSW to  assist with Transportation to [place] via cab. Pt will have f/u at the clinic [facility]). No further needs from CM at this time.   Bethena Roys, RN 08/12/2016, 12:23 PM

## 2016-08-12 NOTE — Progress Notes (Signed)
Orthopedic Tech Progress Note Patient Details:  Brad [name] [birth year] [id number]  Ortho Devices Type of Ortho Device: CAM walker Ortho Device/Splint Interventions: Application   [place] Pink 08/12/2016, 12:30 PM

## 2016-08-12 NOTE — Progress Notes (Addendum)
[facility] PROGRESS NOTE  Brad [name] [id number] DOB: 1961-02-24 DOA: 08/11/2016   PCP: Arnoldo Morale, MD     Assessment/Plan: Principal Problem:   Chest pain Active Problems:   Chronic diastolic congestive heart failure ([place])   Essential hypertension   Uncontrolled diabetes mellitus with diabetic nephropathy, with long-term current use of insulin (HCC)   CKD (chronic kidney disease) stage 3, GFR 30-59 ml/min   Brad [name] is a 55 y.o. male with recent admission for right ankle ORIF and has had cardiac cath on 07/07/2016 last month previous history of cocaine abuse and has not had any cocaine for many weeks presents to the ER because of chest pain. Patient's chest pain started off yesterday morning. Pain is more on lying down. Pain is pressure-like nonradiating with no associated shortness of breath productive cough fever chills. CT angiogram of the chest was negative for PE. EKG was showing nonspecific findings. Patient is being admitted for further observation. Presently on my exam patient is chest pain-free. Patient blood pressures also found to be elevated.   Assessment and plan  1. Chest pain - appears atypical and increases on lying down. EKG does not show any definite signs of pericarditis. CT angiogram of the chest was unremarkable. Patient has had cardiac cath last month on 07/07/2069. Cardiac markers okay , Dr. Cathie Olden reviewed patient's chart and notified me that cardiology consultation is not indicated at this point he had a cardiac cath which was negative no further recommendations from cardiology. Continue aspirin and statins beta blockers. Urine drug screen is negative. Patient also endorses epigastric pain therefore right upper quadrant ultrasound ordered. 2. Hypertensive urgency - patient is on amlodipine and clonidine and hydralazine and Coreg. If patient's blood pressure remains elevated, consider better pain control 3. Diabetes mellitus type 2   uncontrolled, increase Lantus insulins twice a day. Closely follow CBGs. 4. Hyperlipidemia on statins. 5. Recent right ORIF of the right ankle. Requested Dr. Erlinda Hong to see patient, perform dressing changes, also patient is postop and would need narcotic medications to control his pain from orthopedics 6. Mild anemia follow CBC. 7. Abnormal d-dimer-venous Doppler ordered and pending 8. Chronic kidney disease stage IV-baseline creatinine around 1.6   DVT prophylaxsis Lovenox  Code Status:  Full code     Family Communication: Discussed in detail with the patient, all imaging results, lab results explained to the patient   Disposition Plan: Anticipate discharge tomorrow if  Pain is better controlled       Consultants:  Orthopedics  Procedures:  None  Antibiotics: Anti-infectives    None         HPI/Subjective: Complaining of pain in his right leg  Objective: Vitals:   08/12/16 0700 08/12/16 0750 08/12/16 0800 08/12/16 0900  BP: (!) 155/102  (!) 137/105 (!) 156/108  Pulse: 89 79 81 79  Resp: 15 13 14 16   Temp:  98.2 F (36.8 C)    TempSrc:  Oral    SpO2: 98% 97% 97% 99%  Weight:        Intake/Output Summary (Last 24 hours) at 08/12/16 1102 Last data filed at 08/12/16 1019  Gross per 24 hour  Intake              240 ml  Output             1950 ml  Net            -1710 ml    Exam:  Examination:  General exam: Appears calm and comfortable  Respiratory system: Clear to auscultation. Respiratory effort normal. Cardiovascular system: S1 & S2 heard, RRR. No JVD, murmurs, rubs, gallops or clicks. No pedal edema. Gastrointestinal system: Abdomen is nondistended, soft and nontender. No organomegaly or masses felt. Normal bowel sounds heard. Central nervous system: Alert and oriented. No focal neurological deficits. Extremities:Right lower extremity cast Skin: No rashes, lesions or ulcers Psychiatry: Judgement and insight appear normal. Mood & affect appropriate.      Data Reviewed: I have personally reviewed following labs and imaging studies  Micro Results Recent Results (from the past 240 hour(s))  MRSA PCR Screening     Status: None   Collection Time: 08/11/16  9:54 PM  Result Value Ref Range Status   MRSA by PCR NEGATIVE NEGATIVE Final    Comment:        The GeneXpert MRSA Assay (FDA approved for NASAL specimens only), is one component of a comprehensive MRSA colonization surveillance program. It is not intended to diagnose MRSA infection nor to guide or monitor treatment for MRSA infections.     Radiology Reports Dg Tibia/fibula Right  Result Date: 07/24/2016 CLINICAL DATA:  Tripped and fell today. Right ankle swelling and limited range of motion. EXAM: RIGHT TIBIA AND FIBULA - 2 VIEW COMPARISON:  Ankle radiographs from the same day. FINDINGS: Trimalleolar   the comminuted trimalleolar fracture-dislocation is again noted at the ankle. There is posterior subluxation of the talus. More proximal tibia and fibula are intact. The knee is remarkable for degenerative change. No acute abnormalities present at the knee. IMPRESSION: 1. Comminuted trimalleolar fracture dislocation of the right ankle. This is described in greater detail in the report of the ankle radiographs. 2. No significant proximal injury. Electronically Signed   By: San Morelle M.D.   On: 07/24/2016 12:28   Dg Ankle 2 Views Right  Result Date: 07/24/2016 CLINICAL DATA:  Post reduction EXAM: RIGHT ANKLE - 2 VIEW COMPARISON:  08/03/2016 FINDINGS: Fine detail obscured by cast material. There is bi malleolar fracture again demonstrated. Posterior fracture not well demonstrated. There is improved alignment of the ankle mortise. There remains lateral subluxation of the talus in relation to the tibia. The medial malleolus is displaced inward to the medial ankle mortise IMPRESSION: Minimal improvement in alignment following splinting. Trimalleolar fracture. Electronically Signed    By: Suzy Bouchard M.D.   On: 07/24/2016 15:25   Dg Ankle Complete Right  Result Date: 07/30/2016 CLINICAL DATA:  Elective surgery. EXAM: DG C-ARM 61-120 MIN; RIGHT ANKLE - COMPLETE 3+ VIEW COMPARISON:  CT from 6 days ago FINDINGS: Relocated ankle joint with medial malleolus and distal fibula fracture repair. Physiologic alignment of posterior malleolus fracture. Small nondisplaced posterior talus fracture on previous CT is not visualized. IMPRESSION: 1. Fluoroscopy for ankle fracture repair.  No unexpected finding. 2. Nondisplaced posterior talus fracture on previous CT is not visible. Electronically Signed   By: Monte Fantasia M.D.   On: 07/30/2016 15:17   Dg Ankle Complete Right  Result Date: 07/24/2016 CLINICAL DATA:  Tripped and fell today. Right ankle swelling and limited range of motion. EXAM: RIGHT ANKLE - COMPLETE 3+ VIEW COMPARISON:  Tibia and fibula films from the same day. FINDINGS: There is a fracture dislocation of the distal ankle. An avulsion fractures present at the medial malleolus. There is an oblique fracture through the distal fibula compatible with an eversion injury. The tibiotalar distance is exaggerated. A posterior tibial fracture is present as well. The talus is  subluxed posteriorly. Extensive soft tissue swelling is present. Calcaneal spurs are noted. Degenerative changes are present in the hindfoot. IMPRESSION: 1. Comminuted trimalleolar fracture dislocation of the ankle as described. Electronically Signed   By: San Morelle M.D.   On: 07/24/2016 12:26   Ct Angio Chest Pe W Or Wo Contrast  Result Date: 08/11/2016 CLINICAL DATA:  Chest pain, shortness of breath and diaphoresis beginning at 6 a.m. this morning. EXAM: CT ANGIOGRAPHY CHEST WITH CONTRAST TECHNIQUE: Multidetector CT imaging of the chest was performed using the standard protocol during bolus administration of intravenous contrast. Multiplanar CT image reconstructions and MIPs were obtained to evaluate the  vascular anatomy. CONTRAST:  70 cc Isovue 370. COMPARISON:  CT chest 05/27/2016. FINDINGS: No pulmonary embolus is identified. There is marked cardiomegaly. Calcific aortic and coronary atherosclerosis is identified. No pleural or pericardial effusion. The lungs demonstrate some dependent atelectatic change. Visualized upper abdomen shows cirrhotic change of the liver. There is some thoracic spondylosis. No lytic or sclerotic bony lesion is identified. Review of the MIP images confirms the above findings. IMPRESSION: Negative for pulmonary embolus or acute disease. Calcific aortic and coronary atherosclerosis. Marked cardiomegaly. Cirrhosis. Electronically Signed   By: Inge Rise M.D.   On: 08/11/2016 16:00   Ct Ankle Right Wo Contrast  Result Date: 07/24/2016 CLINICAL DATA:  Right ankle fracture. Trip and fall with right ankle pain. EXAM: CT OF THE RIGHT ANKLE WITHOUT CONTRAST TECHNIQUE: Multidetector CT imaging of the right ankle was performed according to the standard protocol. Multiplanar CT image reconstructions were also generated. COMPARISON:  Radiographs earlier this day FINDINGS: Displaced trimalleolar fracture. Transverse fracture through the medial malleolus is minimally comminuted. Distal component remains aligned with the talus. There is a 6 mm lateral subluxation of the main tibial shaft with respect to the distal fragment. Small posterior tibial fracture component is displaced 8 mm posteriorly. The tibial plafond disease subluxed anteriorly with respect to the talar dome. Comminuted primarily oblique displaced distal fibular fracture at and just proximal to the ankle mortise. Multiple small fracture fragments at the fracture plane. Additionally there is a displaced fracture of the anterior lateral tibia with displacement of a 13 mm fragment laterally. This may be an avulsion injury from the anterior syndesmotic attachment. There is no evidence of flexor tendon entrapment. Mild thickening of  perennial brevis tendon at the fibular fracture site, with minimal lateral subluxation in the fibular groove. Extensor tendons are intact. There is diffuse soft tissue edema. IMPRESSION: 1. Displaced trimalleolar fracture with anterior subluxation of the tibial plafond with respect to the talar dome. 2. Additionally there is a displaced fracture from the anterior lateral tibia. Electronically Signed   By: Jeb Levering M.D.   On: 07/24/2016 18:57   Dg Chest Port 1 View  Result Date: 08/11/2016 CLINICAL DATA:  Shortness of breath today. History of prostate cancer. EXAM: PORTABLE CHEST 1 VIEW COMPARISON:  Chest x-ray a 05/27/2016 FINDINGS: The cardiac silhouette, mediastinal hilar contours are within normal limits and stable. Stable tortuosity and calcification of the thoracic aorta. Low lung volumes with vascular crowding and bibasilar atelectasis. No definite infiltrates or effusions. The bony thorax is intact. IMPRESSION: No acute cardiopulmonary findings. Electronically Signed   By: Marijo Sanes M.D.   On: 08/11/2016 11:58   Dg C-arm 61-120 Min  Result Date: 07/30/2016 CLINICAL DATA:  Elective surgery. EXAM: DG C-ARM 61-120 MIN; RIGHT ANKLE - COMPLETE 3+ VIEW COMPARISON:  CT from 6 days ago FINDINGS: Relocated ankle joint with medial  malleolus and distal fibula fracture repair. Physiologic alignment of posterior malleolus fracture. Small nondisplaced posterior talus fracture on previous CT is not visualized. IMPRESSION: 1. Fluoroscopy for ankle fracture repair.  No unexpected finding. 2. Nondisplaced posterior talus fracture on previous CT is not visible. Electronically Signed   By: Monte Fantasia M.D.   On: 07/30/2016 15:17     CBC  Recent Labs Lab 08/11/16 1233 08/11/16 1242  WBC 6.9  --   HGB 12.6* 13.6  HCT 37.0* 40.0  PLT 315  --   MCV 93.7  --   MCH 31.9  --   MCHC 34.1  --   RDW 12.0  --   LYMPHSABS 1.9  --   MONOABS 0.6  --   EOSABS 0.2  --   BASOSABS 0.1  --      Chemistries   Recent Labs Lab 08/11/16 1242  NA 136  K 3.9  CL 98*  GLUCOSE 278*  BUN 16  CREATININE 1.60*   ------------------------------------------------------------------------------------------------------------------ estimated creatinine clearance is 54.9 mL/min (by C-G formula based on SCr of 1.6 mg/dL). ------------------------------------------------------------------------------------------------------------------ No results for input(s): HGBA1C in the last 72 hours. ------------------------------------------------------------------------------------------------------------------ No results for input(s): CHOL, HDL, LDLCALC, TRIG, CHOLHDL, LDLDIRECT in the last 72 hours. ------------------------------------------------------------------------------------------------------------------ No results for input(s): TSH, T4TOTAL, T3FREE, THYROIDAB in the last 72 hours.  Invalid input(s): FREET3 ------------------------------------------------------------------------------------------------------------------ No results for input(s): VITAMINB12, FOLATE, FERRITIN, TIBC, IRON, RETICCTPCT in the last 72 hours.  Coagulation profile No results for input(s): INR, PROTIME in the last 168 hours.   Recent Labs  08/11/16 1233  DDIMER 2.19*    Cardiac Enzymes  Recent Labs Lab 08/11/16 2254 08/12/16 0411  TROPONINI 0.04* 0.04*   ------------------------------------------------------------------------------------------------------------------ Invalid input(s): POCBNP   CBG:  Recent Labs Lab 08/11/16 2145 08/12/16 0722  GLUCAP 293* 248*       Studies: Ct Angio Chest Pe W Or Wo Contrast  Result Date: 08/11/2016 CLINICAL DATA:  Chest pain, shortness of breath and diaphoresis beginning at 6 a.m. this morning. EXAM: CT ANGIOGRAPHY CHEST WITH CONTRAST TECHNIQUE: Multidetector CT imaging of the chest was performed using the standard protocol during bolus administration  of intravenous contrast. Multiplanar CT image reconstructions and MIPs were obtained to evaluate the vascular anatomy. CONTRAST:  70 cc Isovue 370. COMPARISON:  CT chest 05/27/2016. FINDINGS: No pulmonary embolus is identified. There is marked cardiomegaly. Calcific aortic and coronary atherosclerosis is identified. No pleural or pericardial effusion. The lungs demonstrate some dependent atelectatic change. Visualized upper abdomen shows cirrhotic change of the liver. There is some thoracic spondylosis. No lytic or sclerotic bony lesion is identified. Review of the MIP images confirms the above findings. IMPRESSION: Negative for pulmonary embolus or acute disease. Calcific aortic and coronary atherosclerosis. Marked cardiomegaly. Cirrhosis. Electronically Signed   By: Inge Rise M.D.   On: 08/11/2016 16:00   Dg Chest Port 1 View  Result Date: 08/11/2016 CLINICAL DATA:  Shortness of breath today. History of prostate cancer. EXAM: PORTABLE CHEST 1 VIEW COMPARISON:  Chest x-ray a 05/27/2016 FINDINGS: The cardiac silhouette, mediastinal hilar contours are within normal limits and stable. Stable tortuosity and calcification of the thoracic aorta. Low lung volumes with vascular crowding and bibasilar atelectasis. No definite infiltrates or effusions. The bony thorax is intact. IMPRESSION: No acute cardiopulmonary findings. Electronically Signed   By: Marijo Sanes M.D.   On: 08/11/2016 11:58      Lab Results  Component Value Date   HGBA1C 9.5 (H) 07/01/2016   HGBA1C 9.1 03/11/2016  HGBA1C 9.50 11/21/2015   Lab Results  Component Value Date   MICROALBUR 23.29 (H) 02/16/2012   LDLCALC 72 07/08/2016   CREATININE 1.60 (H) 08/11/2016       Scheduled Meds: . amitriptyline  75 mg Oral QHS  . amLODipine  5 mg Oral Daily  . aspirin EC  325 mg Oral Daily  . atorvastatin  10 mg Oral Daily  . carvedilol  12.5 mg Oral BID WC  . cloNIDine  0.1 mg Oral BID  . enoxaparin (LOVENOX) injection  40 mg  Subcutaneous QHS  . gabapentin  900 mg Oral BID  . hydrALAZINE  25 mg Oral TID  . insulin aspart  0-9 Units Subcutaneous TID WC  . insulin glargine  55 Units Subcutaneous BID  . morphine      . nitroGLYCERIN      . pantoprazole  40 mg Oral Daily  . sertraline  150 mg Oral Daily   Continuous Infusions: . nitroGLYCERIN 5 mcg/min (08/12/16 0029)     LOS: 0 days    Time spent: >30 MINS    [facility]  [facility] Pager [phone]. If 7PM-7AM, please contact night-coverage at [email or web address], password [facility] 08/12/2016, 11:02 AM  LOS: 0 days

## 2016-08-12 NOTE — Hospital Discharge Follow-Up (Signed)
Met with the patient today. Informed him that a message has been left for the shelter director at [facility] inquiring about bed availability, no patient information was left on the voicemail message.   The patient stated that he received a call from a woman at the [facility] this morning and he was not able to speak to her because the doctor was with him. He said that he will need to call back. He also said that he left a message for his friend in [place] who has a shelter and is waiting to hear back from her about bed availability. He said that he will not be going home today as he needs to have a " procedure " on his right leg. He also stated that the " social worker" from the hospital met with him and he completed a medicaid application. He said that he was provided any information about food stamps.    Update provided to Jacqlyn Krauss, RN CM  Will continue to follow his hospital course.

## 2016-08-12 NOTE — Telephone Encounter (Signed)
Call received from Will at the [facility]. He stated that they have beds available and may even have beds available tomorrow. He noted that they are able to take people who don't have intense medical needs, he noted CPAP for instance. Regarding the patient's need to get to his medical appointments in [place], Will said that he has interns available who should be able to assist with the transportation. Explained that the patient may need transportation to appointments once a week. Informed him that the patient is still in the hospital and the discharge date is not known. This CM will call back when the discharge dates is known.     Update provided to Jacqlyn Krauss, RN CM and to the patient

## 2016-08-12 NOTE — Telephone Encounter (Signed)
Attempted to reach Daryel Gerald, Director of [facility] [place] # [phone] to inquire if there are any beds available tonight.  Voicemail message left requesting a call back to # [phone] or [phone].

## 2016-08-12 NOTE — Progress Notes (Signed)
11 sutures removed from patient's right  lateral leg and 6 sutures from pt's right medial ankle cleansed and steri strips applied per MD order. Pt tolerated well.

## 2016-08-13 ENCOUNTER — Telehealth: Payer: Self-pay

## 2016-08-13 ENCOUNTER — Ambulatory Visit (HOSPITAL_COMMUNITY): Payer: Self-pay

## 2016-08-13 ENCOUNTER — Ambulatory Visit (HOSPITAL_BASED_OUTPATIENT_CLINIC_OR_DEPARTMENT_OTHER): Payer: Medicaid Other

## 2016-08-13 ENCOUNTER — Encounter (HOSPITAL_COMMUNITY): Payer: Self-pay

## 2016-08-13 DIAGNOSIS — N183 Chronic kidney disease, stage 3 (moderate): Secondary | ICD-10-CM

## 2016-08-13 DIAGNOSIS — Z590 Homelessness: Secondary | ICD-10-CM

## 2016-08-13 DIAGNOSIS — M79609 Pain in unspecified limb: Secondary | ICD-10-CM

## 2016-08-13 DIAGNOSIS — I10 Essential (primary) hypertension: Secondary | ICD-10-CM

## 2016-08-13 DIAGNOSIS — I5032 Chronic diastolic (congestive) heart failure: Secondary | ICD-10-CM

## 2016-08-13 DIAGNOSIS — Z794 Long term (current) use of insulin: Secondary | ICD-10-CM

## 2016-08-13 DIAGNOSIS — R079 Chest pain, unspecified: Secondary | ICD-10-CM

## 2016-08-13 DIAGNOSIS — E119 Type 2 diabetes mellitus without complications: Secondary | ICD-10-CM

## 2016-08-13 LAB — COMPREHENSIVE METABOLIC PANEL
ALT: 32 U/L (ref 17–63)
AST: 38 U/L (ref 15–41)
Albumin: 3.4 g/dL — ABNORMAL LOW (ref 3.5–5.0)
Alkaline Phosphatase: 104 U/L (ref 38–126)
Anion gap: 8 (ref 5–15)
BUN: 18 mg/dL (ref 6–20)
CO2: 28 mmol/L (ref 22–32)
Calcium: 9.2 mg/dL (ref 8.9–10.3)
Chloride: 98 mmol/L — ABNORMAL LOW (ref 101–111)
Creatinine, Ser: 1.97 mg/dL — ABNORMAL HIGH (ref 0.61–1.24)
GFR calc Af Amer: 42 mL/min — ABNORMAL LOW (ref >60)
GFR calc non Af Amer: 36 mL/min — ABNORMAL LOW (ref >60)
Glucose, Bld: 164 mg/dL — ABNORMAL HIGH (ref 65–99)
Potassium: 3.5 mmol/L (ref 3.5–5.1)
Sodium: 134 mmol/L — ABNORMAL LOW (ref 135–145)
Total Bilirubin: 0.7 mg/dL (ref 0.3–1.2)
Total Protein: 8.6 g/dL — ABNORMAL HIGH (ref 6.5–8.1)

## 2016-08-13 LAB — CBC
HCT: 38.2 % — ABNORMAL LOW (ref 39.0–52.0)
Hemoglobin: 12.8 g/dL — ABNORMAL LOW (ref 13.0–17.0)
MCH: 31.8 pg (ref 26.0–34.0)
MCHC: 33.5 g/dL (ref 30.0–36.0)
MCV: 95 fL (ref 78.0–100.0)
Platelets: 329 10*3/uL (ref 150–400)
RBC: 4.02 MIL/uL — ABNORMAL LOW (ref 4.22–5.81)
RDW: 12.3 % (ref 11.5–15.5)
WBC: 8.5 10*3/uL (ref 4.0–10.5)

## 2016-08-13 LAB — GLUCOSE, CAPILLARY
Glucose-Capillary: 271 mg/dL — ABNORMAL HIGH (ref 65–99)
Glucose-Capillary: 226 mg/dL — ABNORMAL HIGH (ref 65–99)

## 2016-08-13 MED ORDER — OXYCODONE-ACETAMINOPHEN 2.5-325 MG PO TABS: 1.0000 | ORAL_TABLET | ORAL | 0 refills | Status: DC | PRN

## 2016-08-13 MED ORDER — INSULIN GLARGINE 100 UNIT/ML ~~LOC~~ SOLN: 55.0000 [IU] | Freq: Two times a day (BID) | SUBCUTANEOUS | 3 refills | Status: DC

## 2016-08-13 MED ORDER — BISACODYL 10 MG RE SUPP
10.0000 mg | Freq: Once | RECTAL | Status: AC
  Administered 2016-08-13: 10 mg via RECTAL
  Filled 2016-08-13: qty 1

## 2016-08-13 MED ORDER — POLYETHYLENE GLYCOL 3350 17 G PO PACK: 17.0000 g | PACK | Freq: Every day | ORAL | 0 refills | Status: DC

## 2016-08-13 MED ORDER — POLYETHYLENE GLYCOL 3350 17 G PO PACK
17.0000 g | PACK | Freq: Every day | ORAL | Status: DC
  Administered 2016-08-13: 17 g via ORAL
  Filled 2016-08-13: qty 1

## 2016-08-13 MED ORDER — CLONIDINE HCL 0.1 MG PO TABS: 0.1000 mg | ORAL_TABLET | Freq: Two times a day (BID) | ORAL | 0 refills | Status: DC

## 2016-08-13 MED ORDER — CARVEDILOL 12.5 MG PO TABS: 12.5000 mg | ORAL_TABLET | Freq: Two times a day (BID) | ORAL | 1 refills | Status: DC

## 2016-08-13 MED FILL — CARVEDILOL 12.5 MG TABLET: 12.5 | 30 days supply | Qty: 60 | Fill #0

## 2016-08-13 MED FILL — LANTUS 100 UNITS/ML VIAL: 100 | 9 days supply | Qty: 10 | Fill #0

## 2016-08-13 MED FILL — POLYETHYLENE GLYCOL 3350: 15 days supply | Qty: 255 | Fill #0

## 2016-08-13 NOTE — Progress Notes (Signed)
Reviewed missing/empty pt medications. Dr. Erlinda Hong re-printed prescriptions for lantus, coreg, and clondine. [facility] Prescription for oxycodone printed, signed and given to pt.

## 2016-08-13 NOTE — Progress Notes (Addendum)
*  Preliminary Results* Right lower extremity venous duplex completed. Right lower extremity is negative for deep vein thrombosis. There is no evidence of right Baker's cyst.  Incidental finding: there are multiple heterogenous areas of the right groin, suggestive of enlarged right inguinal lymph nodes.  08/13/2016 4:05 PM  Maudry Mayhew, BS, RVT, RDCS, RDMS

## 2016-08-13 NOTE — Discharge Summary (Signed)
Discharge Summary  Brad [name] DOB: 06/22/1961  PCP: Arnoldo Morale, MD  Admit date: 08/11/2016 Discharge date: 08/13/2016  Time spent: >20mins  Recommendations for Outpatient Follow-up:  1. F/u with PMD within a week  for hospital discharge follow up, repeat cbc/bmp at follow up, pmd to monitor patient's blood sugar control, currently patient still has lantus, but he state he can not afford expensive insulin once he run out lantus, patient is advise to use [employer] relion brand after running out lantus, to be directed by pmd 2. F/u with orthopedic Dr Trenton Gammon, patient is to wear cam walker and nonweight bearing to right leg 3. F/u with infection disease for hepatitis c 4. F/u with cardiology for chronic systolic heart failure   Discharge Diagnoses:  Active Hospital Problems   Diagnosis Date Noted  . Chest pain 07/01/2016  . Uncontrolled diabetes mellitus with diabetic nephropathy, with long-term current use of insulin ([place]) 07/01/2016  . Essential hypertension 07/01/2016  . CKD (chronic kidney disease) stage 3, GFR 30-59 ml/min 07/01/2016  . Chronic diastolic congestive heart failure ([place]) 01/31/2016    Resolved Hospital Problems   Diagnosis Date Noted Date Resolved  No resolved problems to display.    Discharge Condition: stable  Diet recommendation: heart healthy/carb modified  Filed Weights   08/12/16 0416 08/13/16 0549  Weight: 88.5 kg (195 lb 3.2 oz) 89.8 kg (197 lb 14.4 oz)    History of present illness:  Chief Complaint: Chest pain.  HPI: Brad [name] is a 55 y.o. male with recent admission for right ankle ORIF and has had cardiac cath on 07/07/2016 last month previous history of cocaine abuse and has not had any cocaine for many weeks presents to the ER because of chest pain. Patient's chest pain started off yesterday morning. Pain is more on lying down. Pain is pressure-like nonradiating with no associated shortness of breath productive cough  fever chills. CT angiogram of the chest was negative for PE. EKG was showing nonspecific findings. Patient is being admitted for further observation. Presently on my exam patient is chest pain-free. Patient blood pressures also found to be elevated.   Hospital Course:  Principal Problem:   Chest pain Active Problems:   Chronic diastolic congestive heart failure (HCC)   Essential hypertension   Uncontrolled diabetes mellitus with diabetic nephropathy, with long-term current use of insulin (H[facility])   CKD (chronic kidney disease) stage 3, GFR 30-59 ml/min   1. Chest pain- appears atypical and increases on lying down. EKG does not show any definite signs of pericarditis. CT angiogram of the chest was unremarkable. Patient has had cardiac cath last month on 07/07/2069. Cardiac markers okay , cardiology Dr. Cathie Olden reviewed patient's chart and notified me that cardiology consultation is not indicated at this point he had a cardiac cath which was negative no further recommendations from cardiology. Continue aspirin and statins beta blockers. Urine drug screen is negative. Patient also endorses epigastric pain therefore right upper quadrant ultrasound was done which did not show any acute findings. 2. Hypertensive urgency- patient is on amlodipine and clonidine and hydralazine and Coreg. bp well controlled at discharge. 3. Insulin dependent Diabetes mellitus type 2 uncontrolled, a1c 9.5,  He is continued on Lantus insulins, he is advised to consider [employer] brand insulin once he run out lantus, he is advised to work with his pmd for blood sugar control 4. Hyperlipidemiaon statins. 5. Recent right ORIF of the right ankle. orthopeducs Dr. Frankey Shown input appreciated, patient is to wear cam  boots and nonweight bearing per ortho recommendations.  6. Mild anemia follow CBC. 7. Abnormal d-dimer-CTA chest no PE, venous Doppler no DVT. 8. Chronic kidney disease stage IV-baseline creatinine around 1.6, renal  dosing meds 9. Hepatitis c: infections disease follow up 10. Chronic systolic chf, euvolemic at discharge, continue outpatient cardiology follow up   DVT prophylaxsis while in the hospital Lovenox, he is prescribed asa 325 bid per ortho post op instruction  Code Status:  Full code     Family Communication: Discussed in detail with the patient, all imaging results, lab results explained to the patient   Disposition Plan: patient is to be discharged to "[facility] " homeless shelter in [place], [facility] .      Consultants:  Orthopedics Dr Trenton Gammon  Cardiology Dr Maylon Peppers   Procedures:  None  Antibiotics:    Anti-infectives    None      Discharge Exam: BP 136/84 (BP Location: Left Arm)   Pulse 68   Temp 97.9 F (36.6 C) (Oral)   Resp 15   Wt 89.8 kg (197 lb 14.4 oz)   SpO2 100%   BMI 27.60 kg/m    General exam: Appears calm and comfortable  Respiratory system: Clear to auscultation. Respiratory effort normal. Cardiovascular system: S1 & S2 heard, RRR. No JVD, murmurs, rubs, gallops or clicks. No pedal edema. Gastrointestinal system: Abdomen is nondistended, soft and nontender. No organomegaly or masses felt. Normal bowel sounds heard. Central nervous system: Alert and oriented. No focal neurological deficits. Extremities: Right lower extremity case removed, surgical wound healing, suture removed  Skin: No rashes, lesions or ulcers Psychiatry: Judgement and insight appear normal. Mood & affect appropriate.    Discharge Instructions You were cared for by a hospitalist during your hospital stay. If you have any questions about your discharge medications or the care you received while you were in the hospital after you are discharged, you can call the unit and asked to speak with the hospitalist on call if the hospitalist that took care of you is not available. Once you are discharged,  your primary care physician will handle any further medical issues. Please note that NO REFILLS for any discharge medications will be authorized once you are discharged, as it is imperative that you return to your primary care physician (or establish a relationship with a primary care physician if you do not have one) for your aftercare needs so that they can reassess your need for medications and monitor your lab values.  Discharge Instructions    Diet - low sodium heart healthy    Complete by:  As directed   Carb modified   Discharge instructions    Complete by:  As directed   May continue lantus that you have , you can use [employer] Relion insulin when you run out lantus, please work with your primary care physician for insulin does adjustment and diabetes control.   Discharge instructions    Complete by:  As directed   nonweight bearing to right leg   Increase activity slowly    Complete by:  As directed       Medication List    STOP taking these medications   HUMULIN 70/30 KWIKPEN (70-30) 100 UNIT/ML PEN Generic drug:  Insulin Isophane & Regular Human   oxyCODONE 10 mg 12 hr tablet Commonly known as:  OXYCONTIN   sertraline 100 MG tablet Commonly known as:  ZOLOFT  TAKE these medications   acetaminophen 325 MG tablet Commonly known as:  TYLENOL Take 2 tablets (650 mg total) by mouth every 6 (six) hours as needed for mild pain (or Fever >/= 101).   amitriptyline 75 MG tablet Commonly known as:  ELAVIL Take 1 tablet (75 mg total) by mouth at bedtime.   amLODipine 5 MG tablet Commonly known as:  NORVASC Take 1 tablet (5 mg total) by mouth daily.   aspirin EC 325 MG tablet Take 1 tablet (325 mg total) by mouth 2 (two) times daily. What changed:  Another medication with the same name was removed. Continue taking this medication, and follow the directions you see here.   atorvastatin 10 MG tablet Commonly known as:  LIPITOR Take 1 tablet (10 mg total) by mouth daily.     carvedilol 12.5 MG tablet Commonly known as:  COREG Take 1 tablet (12.5 mg total) by mouth 2 (two) times daily.   cloNIDine 0.1 MG tablet Commonly known as:  CATAPRES Take 1 tablet (0.1 mg total) by mouth 2 (two) times daily.   gabapentin 300 MG capsule Commonly known as:  NEURONTIN Take 3 capsules (900 mg total) by mouth 2 (two) times daily.   hydrALAZINE 25 MG tablet Commonly known as:  APRESOLINE Take 1 tablet (25 mg total) by mouth 3 (three) times daily.   insulin glargine 100 UNIT/ML injection Commonly known as:  LANTUS Inject 0.55 mLs (55 Units total) into the skin 2 (two) times daily.   methocarbamol 750 MG tablet Commonly known as:  ROBAXIN Take 1 tablet (750 mg total) by mouth 2 (two) times daily as needed for muscle spasms.   omeprazole 20 MG capsule Commonly known as:  PRILOSEC Take 1 capsule (20 mg total) by mouth daily.   ondansetron 4 MG tablet Commonly known as:  ZOFRAN Take 1-2 tablets (4-8 mg total) by mouth every 8 (eight) hours as needed for nausea or vomiting.   oxycodone-acetaminophen 2.5-325 MG tablet Commonly known as:  PERCOCET Take 1 tablet by mouth every 4 (four) hours as needed for pain.   polyethylene glycol packet Commonly known as:  MIRALAX / GLYCOLAX Take 17 g by mouth daily.   senna-docusate 8.6-50 MG tablet Commonly known as:  SENOKOT S Take 1 tablet by mouth at bedtime as needed.      Allergies  Allergen Reactions  . Angiotensin Receptor Blockers Other (See Comments)    (Angioedema with lisinopril, therefore ARB's are contraindicated)  . Lisinopril Other (See Comments)    Angioedema  . Pamelor [Nortriptyline Hcl]     Swelling    Follow-up [facility]. Go on 08/21/2016.   Specialty:  Internal Medicine Why:  at 11:30 am for an appoinment with the [facility].  Contact information: [id number] [place] [id number] [phone]        Marianna Payment, MD Follow up in 1 month(s).   Specialty:  Orthopedic Surgery Why:  right ankle surgery Contact information: [place] [id number] [phone]        [facility]              Follow up in 1 month(s).   Why:  hepatitis c Contact information: [address] [place] [phone]           The results of significant diagnostics from this hospitalization (including imaging, microbiology, ancillary and  laboratory) are listed below for reference.    Significant Diagnostic Studies:   Microbiology: Recent Results (from the past 240 hour(s))  MRSA PCR Screening     Status: None   Collection Time: 08/11/16  9:54 PM  Result Value Ref Range Status   MRSA by PCR NEGATIVE NEGATIVE Final    Comment:        The GeneXpert MRSA Assay (FDA approved for NASAL specimens only), is one component of a comprehensive MRSA colonization surveillance program. It is not intended to diagnose MRSA infection nor to guide or monitor treatment for MRSA infections.      Labs: Basic Metabolic Panel:  Recent Labs Lab 08/11/16 1242 08/13/16 0354  NA 136 134*  K 3.9 3.5  CL 98* 98*  CO2  --  28  GLUCOSE 278* 164*  BUN 16 18  CREATININE 1.60* 1.97*  CALCIUM  --  9.2   Liver Function Tests:  Recent Labs Lab 08/13/16 0354  AST 38  ALT 32  ALKPHOS 104  BILITOT 0.7  PROT 8.6*  ALBUMIN 3.4*   No results for input(s): LIPASE, AMYLASE in the last 168 hours. No results for input(s): AMMONIA in the last 168 hours. CBC:  Recent Labs Lab 08/11/16 1233 08/11/16 1242 08/13/16 0354  WBC 6.9  --  8.5  NEUTROABS 4.3  --   --   HGB 12.6* 13.6 12.8*  HCT 37.0* 40.0 38.2*  MCV 93.7  --  95.0  PLT 315  --  329   Cardiac Enzymes:  Recent Labs Lab 08/11/16 2254 08/12/16 0411 08/12/16 1047  TROPONINI 0.04* 0.04* 0.04*   BNP: BNP (last 3 results)  Recent Labs  07/01/16 1216   BNP 266.8*    ProBNP (last 3 results) No results for input(s): PROBNP in the last 8760 hours.  CBG:  Recent Labs Lab 08/12/16 1127 08/12/16 1622 08/12/16 1955 08/13/16 0727 08/13/16 1103  GLUCAP 207* 193* 165* 271* 226*       Signed:  Tremell Reimers MD, PhD  [facility] 08/13/2016, 4:16 PM

## 2016-08-13 NOTE — Clinical Social Work Note (Signed)
Clinical Social Worker notified of patient discharge and recommendation for SNF from PT.  CSW inquired about potential LOG placement, however patient does not meet criteria at this time as a necessity for placement.  CSW was able to obtain a bed at [facility] in [facility] and provide cab voucher due to patient physical limitations for PART bus transport and transfers.  Patient is unhappy with shelter placement, and continues to insist on SNF placement.  CSW has made it clear that at this time, shelter placement will be patient only option as far as assistance at discharge.    Patient medications to be delivered to hospital from case manager, [name], from [facility] prior to discharge.  CSW received phone call from [facility] and provided information regarding patient case.  Patient has expressed to DSS worker that he is unable to care for himself in a shelter and is in need of placement.  CSW provided therapy notes and information regarding patient admission to [place] worker who plans to follow up with patient directly.    Clinical Social Worker will sign off for now as social work intervention is no longer needed. Please consult [place] again if new need arises.  Barbette Or, [place]

## 2016-08-13 NOTE — Progress Notes (Signed)
Forgot to grab walker when discharging patient. Offered to go back to unit to get walker and patient refused. Patient also refused cab voucher. Lenna Sciara, RN

## 2016-08-13 NOTE — Telephone Encounter (Signed)
Call received from Chriss Driver - Cherlyn Cushing, RN CM informing that the patient is ready for discharge today.   Call placed to the [facility]   - [facility] shelter # [phone] and a voice mail message was left for the director, Will, inquiring about bed availability and requesting a call back to # [phone] or [phone].    Message sent to Chriss Driver- Cherlyn Cushing, RN CM informing her that a message was left for the shelter director inquiring if any beds are available. Also informed her that as her the recording at the shelter, the patient needs to bring a picture ID and can't be on the sex offender list.

## 2016-08-13 NOTE — Hospital Discharge Follow-Up (Signed)
Met with the patient to explain to him that this CM is still waiting to hear from the [facility] regarding the status of available beds.  Encouraged him to try to contact the woman in [place] who he may be able to stay with after discharge. He also stated that he may be going to short term rehab.    He stated that he has all of his medications and his glucometer with him except for the insulin. He said that he may need to get the insulin from his apartment but would not be able to get into the apartment with the assistance of the manager until Thursday, 08/14/16.  His Pastor- Shanon Brow Surrett from [employer] was visiting with him and the patient wanted his Doristine Bosworth to meet this CM and remain in the room for part of the meeting. The Doristine Bosworth stated that his church can try to assist with providing rides for the patient to his medical appointments but there is no guarantee of assistance at this time  The Doristine Bosworth stated that he has provided the patient with resources for housing and the patient  noted that he spoke with a representative at the [facility] yesterday but needs to call back again to check on availability.   Will continue to follow his hospital course

## 2016-08-13 NOTE — Hospital Discharge Follow-Up (Signed)
Message received from Jacqlyn Krauss, RN CM noting that the patient will not be going to SNF.  Call placed to [facility] and a voice mail message was left for Will, Shelter Supervisor, inquiring about bed availability. Call back requested to # [phone]. Another call was placed to [facility] and a voice mail message was left for Teressa Lower, inquiring about bed availability and a call back was requested to # [phone].  Update provided to B. Adelfa Koh, RN CM

## 2016-08-13 NOTE — Progress Notes (Addendum)
Discharge teaching and instructions reviewed. VSS. Pt has no further questions at this time. Refusing to go to the shelter, having a friend come pick him up. Awaiting jane from community health to bring him his medications. Walker given to pt.

## 2016-08-13 NOTE — Hospital Discharge Follow-Up (Signed)
Brought the patient's medications from [place] to [place] for the patient's nurse, Jess, to give to him at discharge. Informed Jess that the insulin was ordered and is in the bag but no needles or syringes were ordered.  Met with the patient and he said that he doesn't want to go to the shelter. He said that the sheriff is coming to his house tomorrow and his furniture is being removed at 0800. He stated that tonight he will be staying with a friend who lives across the street from his apartment where he was living prior to being evicted. He noted that he has another friend, Mr Ace Gins, who is going to get a truck and will be at his apartment at 0800 and will get his furniture before it is discarded.  The patient did not report where he would be staying after tonight.Informed him that his medications from [facility] are at the nursing station for him when he is ready to be discharged.   He said that he has needles and syringes for the insulin in his apartment and he can get them tomorrow.   Informed him that the TCC CM from [facility] will be calling to check on him tomorrow. Reminded him of his appointment at [facility] on 08/21/16 @ 1130 and informed him that transportation to the clinic for the medical appointment can be arranged for him if needed.

## 2016-08-13 NOTE — Evaluation (Signed)
Physical Therapy Evaluation Patient Details Name: Brad [name] MRN: [id number] DOB: December 06, 1961 Today's Date: 08/13/2016   History of Present Illness  Patient is a 55 y/o male admitted with chest pain s/p heart cath 7/17 and R ankle ORIF 8/9.  PMH positive for h/o prostate CA, DN, HTN, CHF, depression, GERD, neuropathy and hepatitic C.  Clinical Impression  Patient is unable to ambulate safely without assistance.  Previously to admission reports was crawling on his hands and knees to get around his apartment (noted callouses on his knuckles).  Reports the wheelchair would not fit to maneuver in his apartment.  Now, homeless with plans to d/c to shelter, but could benefit from SNF level rehab if able to qualify due to limited mobility, poor activity tolerance with evidence of cardiac stress with hopping (low BP) and poor overall hygiene with risk for infection in a shelter.  Will follow during acute stay.      Follow Up Recommendations SNF    Equipment Recommendations  None recommended by PT    Recommendations for Other Services       Precautions / Restrictions Precautions Precautions: Fall Restrictions Weight Bearing Restrictions: Yes RLE Weight Bearing: Non weight bearing      Mobility  Bed Mobility Overal bed mobility: Modified Independent                Transfers   Equipment used: Rolling walker (2 wheeled) Transfers: Sit to/from Stand Sit to Stand: Min assist         General transfer comment: assist for safety, balance, cue to maintain NWB  Ambulation/Gait Ambulation/Gait assistance: Min assist;Mod assist Ambulation Distance (Feet): 40 Feet Assistive device: Rolling walker (2 wheeled)       General Gait Details: increased assist as pt fatigued and needed help to hop and keep walker close, pulled chair up for pt to sit  Stairs            Wheelchair Mobility    Modified [name] (Stroke Patients Only)       Balance Overall balance  assessment: Needs assistance         Standing balance support: Bilateral upper extremity supported Standing balance-Leahy Scale: Poor Standing balance comment: UE support due to NWB                             Pertinent Vitals/Pain Pain Assessment: 0-10 Pain Score: 6  Pain Location: chest and ankle Pain Descriptors / Indicators: Aching;Discomfort;Tightness Pain Intervention(s): Monitored during session;Repositioned;Limited activity within patient's tolerance    Home Living Family/patient expects to be discharged to:: Shelter/Homeless Living Arrangements: Alone Available Help at Discharge: Friend(s);Available PRN/intermittently           Home Equipment: Walker - 2 wheels;Wheelchair - manual Additional Comments: was in his own apartment with 2 STE; reports was crawling around on his hands and knees due to wheelchair too big to use in the apartment and had difficulty hopping with walker    Prior Function Level of Independence: Needs assistance         Comments: crawling as noted above     Hand Dominance   Dominant Hand: Right    Extremity/Trunk Assessment   Upper Extremity Assessment: Overall WFL for tasks assessed             RLE Deficits / Details: donned camboot on R leg with assist, able to lift leg antigravity but gets tired holding it up to hop  Communication      Cognition Arousal/Alertness: Awake/alert Behavior During Therapy: WFL for tasks assessed/performed Overall Cognitive Status: Within Functional Limits for tasks assessed                      General Comments General comments (skin integrity, edema, etc.): Patient discussed concerns over his current social situation and gave extensive history of his problems.  Discussed need to seek assistance and continue to vent frustrations to allow improved hope and progress towards his goals.     Exercises        Assessment/Plan    PT Assessment Patient needs continued  PT services  PT Diagnosis Difficulty walking;Generalized weakness;Acute pain   PT Problem List Decreased strength;Decreased knowledge of use of DME;Decreased activity tolerance;Pain;Decreased range of motion;Decreased mobility  PT Treatment Interventions DME instruction;Therapeutic activities;Gait training;Therapeutic exercise;Functional mobility training;Balance training   PT Goals (Current goals can be found in the Care Plan section) Acute Rehab PT Goals Patient Stated Goal: To get better PT Goal Formulation: With patient Time For Goal Achievement: 08/20/16 Potential to Achieve Goals: Good    Frequency Min 3X/week   Barriers to discharge Decreased caregiver support;Other (comment) homeless    Co-evaluation               End of Session Equipment Utilized During Treatment: Gait belt;Other (comment) (camboot) Activity Tolerance: Patient limited by fatigue Patient left: with call bell/phone within reach;in chair      Functional Assessment Tool Used: Clinical Judgement Functional Limitation: Mobility: Walking and moving around Mobility: Walking and Moving Around Current Status [id number]): At least 40 percent but less than 60 percent impaired, limited or restricted Mobility: Walking and Moving Around Goal Status [phone]): At least 20 percent but less than 40 percent impaired, limited or restricted    Time: 1010-1100 PT Time Calculation (min) (ACUTE ONLY): 50 min   Charges:   PT Evaluation $PT Eval Moderate Complexity: 1 Procedure PT Treatments $Gait Training: 8-22 mins   PT G Codes:   PT G-Codes **NOT FOR INPATIENT CLASS** Functional Assessment Tool Used: Clinical Judgement Functional Limitation: Mobility: Walking and moving around Mobility: Walking and Moving Around Current Status [id number]): At least 40 percent but less than 60 percent impaired, limited or restricted Mobility: Walking and Moving Around Goal Status [phone]): At least 20 percent but less than 40 percent  impaired, limited or restricted    Brad [name] 08/13/2016, 11:24 AM  Brad [name], [place] 08/13/2016

## 2016-08-14 ENCOUNTER — Telehealth: Payer: Self-pay

## 2016-08-14 ENCOUNTER — Ambulatory Visit: Payer: Self-pay | Admitting: Internal Medicine

## 2016-08-14 LAB — GLUCOSE, CAPILLARY: Glucose-Capillary: 209 mg/dL — ABNORMAL HIGH (ref 65–99)

## 2016-08-14 NOTE — Telephone Encounter (Signed)
[facility] Post-discharge Follow-Up Phone Call:  Date of Discharge: 08/13/16 Principal Discharge Diagnosis(es): Chest pain, hypertensive urgency, uncontrolled insulin dependent diabetes mellitus type 2  Call Completed: No                   With Whom: Patient     Please check all that apply:  X  Patient is knowledgeable of his/her condition(s) and/or treatment. X  Patient is caring for self at home.  ? Patient is receiving assist at home from family and/or caregiver. Family and/or caregiver is knowledgeable of patient's condition(s) and/or treatment. ? Patient is receiving home health services. If so, name of agency.     Medication Reconciliation:  ? Medication list reviewed with patient. X  Patient obtained all discharge medications-Uncertain if patient has all needed medications. Attempted to review patient's discharge medications and medication list with him, but patient indicated his friends were currently moving his belongs out of his apartment so he could not talk.  Will attempt to follow-up with patient at a later time. Did instruct patient x3 to get to ED for evaluation of chest pain as patient informed this Case Manager he was experiencing "a little bit" of chest pain.    Activities of Daily Living:  X  Independent though patient has to use walker for mobility. Patient is to remain nonweightbearing to right lower extremity. ? Needs assist  ? [facility]    Community resources in place for patient:  X  Shelter Resources-Patient has been evicted from his apartment, and his friends are currently moving his belongings out of his apartment. Patient indicated he planned to stay with a friend for the next few days. Inquired if patient had shelter resources if needed, and patient indicated he had area's shelter resources. ? Home Health/Home DME ? Assisted Living ? Support Group               Questions/Concerns discussed: This Case Manager placed call to patient to  complete post-discharge follow-up phone call.  Patient reminded of appointment on 08/21/16 at 1130 with Dr. Jarold Song. Patient aware of appointment. Will need to follow-up with patient at a later time to determine where he is staying and if transportation needed to appointment.  This Case Manager placed call to patient to check on status. Inquired if patient experiencing chest pain. Patient indicated he was experiencing "a little bit" of chest pain. Informed patient x 3 that he should get to ED as soon as possible for evaluation of chest pain, and patient indicated he understood.

## 2016-08-15 ENCOUNTER — Other Ambulatory Visit: Payer: Self-pay

## 2016-08-15 MED ORDER — TRUEPLUS LANCETS 28G MISC: 12 refills | Status: DC

## 2016-08-15 MED ORDER — GLUCOSE BLOOD VI STRP: ORAL_STRIP | 12 refills | Status: DC

## 2016-08-15 MED FILL — TRUEplus LANCETS 28G MISC: 25 days supply | Qty: 100 | Fill #0

## 2016-08-15 MED FILL — TRUE METRIX TEST STRIP: 25 days supply | Qty: 100 | Fill #0

## 2016-08-15 NOTE — Telephone Encounter (Signed)
[facility] Post-discharge Follow-Up Phone Call:  Date of Discharge: 08/13/16 Principal Discharge Diagnosis(es): Chest pain, hypertensive urgency  Call Completed: Yes                   With Whom: Patient    Please check all that apply:  X  Patient is knowledgeable of his/her condition(s) and/or treatment. X  Patient is caring for self at home.  ? Patient is receiving assist at home from family and/or caregiver. Family and/or caregiver is knowledgeable of patient's condition(s) and/or treatment. ? Patient is receiving home health services. If so, name of agency.     Medication Reconciliation:  X  Medication list reviewed with patient. X  Patient obtained all discharge medications-No. Patient's medication list reviewed thoroughly, and patient indicated he has all medication except amitriptyline 75 mg tablet, clonidine 0.1 mg tablet, and oxycodone-acetaminophen 2.5-325 mg tablet.  Patient indicated his right ankle is hurting.  Reminded patient that he will need to pick up oxycodone-acetaminophen 2.5-325 mg tablets from an alternate pharmacy because [facility] does not have medication. Patient indicated he did not have money for medication. Reminded patient again that clinic pharmacy does not have medication, and he will need to get medication from alternate pharmacy. Patient verbalized understanding. This Case Manager spoke with Lurena Joiner, Pharmacist at [facility] and [facility], to check on amitriptyline and clonidine as patient indicated he did not have medications. Was informed by Pharmacist that both medications were picked up on 08/08/16.  Discussed with patient who indicated he did not have these medications. Informed patient again of this Case Manager's conversation with Pharmacist, and patient said he did not have medications.  Advised patient to call [facility] to discuss. Patient verbalized  understanding. Patient also indicated he needed a refill of true metrix test strips and lancets. Order entered per protocol.   Activities of Daily Living:  X  Independent-Patient wearing CAM boot and using walker for mobility. Reminded patient to remain non-weightbearing to right lower extremity. Patient verbalized understanding. ? Needs assist ? [facility]   Community resources in place for patient:  X  Shelter resources-Patient has been evicted from his apartment and is currently staying with a friend. Patient has indicated he has area shelter resources if needed. ? Home Health/Home DME ? Assisted Living ? Support Group                  Questions/Concerns discussed: This Case Manager placed call to patient to complete discharge follow-up phone call. Patient requested funds to compensate his friend for letting him stay with her. Informed patient that clinic does not have resources to compensate his friend. . Reminded patient of scheduled [facility] appointment on 08/21/16 at 1130. Patient aware of appointment. Transitional Care Case Manager will need to contact patient at a later time to determine where patient is staying and to determine if transportation needed to his appointment.  Inquired about patient's status. Patient indicated he was having "a little bit" of chest pain. Informed patient to get to ED for evaluation of chest pain.  Patient was also advised yesterday to go to ED for evaluation of chest pain. Patient indicated he thinks what he is experiencing is from stress. Repeated instructions that chest pain should not be ignored and he should go to ED for evaluation. No additional needs identified.

## 2016-08-16 ENCOUNTER — Encounter (HOSPITAL_COMMUNITY): Payer: Self-pay | Admitting: Emergency Medicine

## 2016-08-16 ENCOUNTER — Emergency Department (HOSPITAL_COMMUNITY): Payer: Medicaid Other

## 2016-08-16 ENCOUNTER — Emergency Department (HOSPITAL_COMMUNITY)
Admission: EM | Admit: 2016-08-16 | Discharge: 2016-08-16 | Disposition: A | Discharge status: Home / Self Care | Payer: Medicaid Other | Attending: Emergency Medicine | Admitting: Emergency Medicine

## 2016-08-16 DIAGNOSIS — I5032 Chronic diastolic (congestive) heart failure: Secondary | ICD-10-CM | POA: Diagnosis not present

## 2016-08-16 DIAGNOSIS — Z79899 Other long term (current) drug therapy: Secondary | ICD-10-CM | POA: Insufficient documentation

## 2016-08-16 DIAGNOSIS — I252 Old myocardial infarction: Secondary | ICD-10-CM | POA: Diagnosis not present

## 2016-08-16 DIAGNOSIS — Z7982 Long term (current) use of aspirin: Secondary | ICD-10-CM | POA: Diagnosis not present

## 2016-08-16 DIAGNOSIS — N183 Chronic kidney disease, stage 3 (moderate): Secondary | ICD-10-CM | POA: Insufficient documentation

## 2016-08-16 DIAGNOSIS — R079 Chest pain, unspecified: Secondary | ICD-10-CM | POA: Diagnosis present

## 2016-08-16 DIAGNOSIS — E1140 Type 2 diabetes mellitus with diabetic neuropathy, unspecified: Secondary | ICD-10-CM | POA: Diagnosis not present

## 2016-08-16 DIAGNOSIS — F17210 Nicotine dependence, cigarettes, uncomplicated: Secondary | ICD-10-CM | POA: Diagnosis not present

## 2016-08-16 DIAGNOSIS — Z96652 Presence of left artificial knee joint: Secondary | ICD-10-CM | POA: Diagnosis not present

## 2016-08-16 DIAGNOSIS — I130 Hypertensive heart and chronic kidney disease with heart failure and stage 1 through stage 4 chronic kidney disease, or unspecified chronic kidney disease: Secondary | ICD-10-CM | POA: Insufficient documentation

## 2016-08-16 DIAGNOSIS — Z8546 Personal history of malignant neoplasm of prostate: Secondary | ICD-10-CM | POA: Diagnosis not present

## 2016-08-16 DIAGNOSIS — E1122 Type 2 diabetes mellitus with diabetic chronic kidney disease: Secondary | ICD-10-CM | POA: Insufficient documentation

## 2016-08-16 DIAGNOSIS — R55 Syncope and collapse: Secondary | ICD-10-CM | POA: Diagnosis not present

## 2016-08-16 DIAGNOSIS — R0789 Other chest pain: Secondary | ICD-10-CM

## 2016-08-16 LAB — BASIC METABOLIC PANEL
Anion gap: 8 (ref 5–15)
BUN: 10 mg/dL (ref 6–20)
CO2: 25 mmol/L (ref 22–32)
Calcium: 8.7 mg/dL — ABNORMAL LOW (ref 8.9–10.3)
Chloride: 100 mmol/L — ABNORMAL LOW (ref 101–111)
Creatinine, Ser: 1.44 mg/dL — ABNORMAL HIGH (ref 0.61–1.24)
GFR calc Af Amer: >60 mL/min (ref >60)
GFR calc non Af Amer: 53 mL/min — ABNORMAL LOW (ref >60)
Glucose, Bld: 182 mg/dL — ABNORMAL HIGH (ref 65–99)
Potassium: 3.2 mmol/L — ABNORMAL LOW (ref 3.5–5.1)
Sodium: 133 mmol/L — ABNORMAL LOW (ref 135–145)

## 2016-08-16 LAB — CBC WITH DIFFERENTIAL/PLATELET
Basophils Absolute: 0 10*3/uL (ref 0.0–0.1)
Basophils Relative: 0 %
Eosinophils Absolute: 0.1 10*3/uL (ref 0.0–0.7)
Eosinophils Relative: 1 %
HCT: 37.8 % — ABNORMAL LOW (ref 39.0–52.0)
Hemoglobin: 13 g/dL (ref 13.0–17.0)
Lymphocytes Relative: 33 %
Lymphs Abs: 2.4 10*3/uL (ref 0.7–4.0)
MCH: 31.9 pg (ref 26.0–34.0)
MCHC: 34.4 g/dL (ref 30.0–36.0)
MCV: 92.9 fL (ref 78.0–100.0)
Monocytes Absolute: 0.7 10*3/uL (ref 0.1–1.0)
Monocytes Relative: 9 %
Neutro Abs: 4 10*3/uL (ref 1.7–7.7)
Neutrophils Relative %: 57 %
Platelets: 318 10*3/uL (ref 150–400)
RBC: 4.07 MIL/uL — ABNORMAL LOW (ref 4.22–5.81)
RDW: 12.6 % (ref 11.5–15.5)
WBC: 7.2 10*3/uL (ref 4.0–10.5)

## 2016-08-16 LAB — I-STAT TROPONIN, ED
Troponin i, poc: 0.05 ng/mL (ref 0.00–0.08)
Troponin i, poc: 0.05 ng/mL (ref 0.00–0.08)

## 2016-08-16 MED ORDER — ONDANSETRON HCL 4 MG/2ML IJ SOLN
4.0000 mg | Freq: Once | INTRAMUSCULAR | Status: AC
  Administered 2016-08-16: 4 mg via INTRAVENOUS
  Filled 2016-08-16: qty 2

## 2016-08-16 MED ORDER — CLONIDINE HCL 0.1 MG PO TABS: 0.1000 mg | ORAL_TABLET | Freq: Two times a day (BID) | ORAL | 0 refills | Status: DC

## 2016-08-16 MED ORDER — CARVEDILOL 12.5 MG PO TABS: 12.5000 mg | ORAL_TABLET | Freq: Two times a day (BID) | ORAL | 0 refills | Status: DC

## 2016-08-16 MED ORDER — HYDRALAZINE HCL 25 MG PO TABS
25.0000 mg | ORAL_TABLET | Freq: Once | ORAL | Status: AC
  Administered 2016-08-16: 25 mg via ORAL
  Filled 2016-08-16: qty 1

## 2016-08-16 MED ORDER — CLONIDINE HCL 0.1 MG PO TABS
0.1000 mg | ORAL_TABLET | Freq: Once | ORAL | Status: AC
  Administered 2016-08-16: 0.1 mg via ORAL
  Filled 2016-08-16: qty 1

## 2016-08-16 MED ORDER — OXYCODONE-ACETAMINOPHEN 5-325 MG PO TABS
1.0000 | ORAL_TABLET | Freq: Once | ORAL | Status: AC
  Administered 2016-08-16: 1 via ORAL
  Filled 2016-08-16: qty 1

## 2016-08-16 MED ORDER — HYDROMORPHONE HCL 1 MG/ML IJ SOLN
0.5000 mg | Freq: Once | INTRAMUSCULAR | Status: AC
  Administered 2016-08-16: 0.5 mg via INTRAVENOUS
  Filled 2016-08-16: qty 1

## 2016-08-16 MED ORDER — AMLODIPINE BESYLATE 5 MG PO TABS: 5.0000 mg | ORAL_TABLET | Freq: Every day | ORAL | 0 refills | Status: DC

## 2016-08-16 MED ORDER — AMLODIPINE BESYLATE 5 MG PO TABS
5.0000 mg | ORAL_TABLET | Freq: Once | ORAL | Status: AC
  Administered 2016-08-16: 5 mg via ORAL
  Filled 2016-08-16: qty 1

## 2016-08-16 MED ORDER — HYDRALAZINE HCL 25 MG PO TABS: 25.0000 mg | ORAL_TABLET | Freq: Three times a day (TID) | ORAL | 0 refills | Status: DC

## 2016-08-16 NOTE — ED Notes (Signed)
Pt up to bathroom with assistance. Dinner tray at bedside.

## 2016-08-16 NOTE — ED Triage Notes (Signed)
Pt became dizzy at approx 12-- fell from standing, no LOC-- "My nephew heard something pop in right ankle"  Then started having chest pain around 1330. Hx of CHF. Recent ORIF of right ankle, positive pedal pulse .

## 2016-08-16 NOTE — Discharge Instructions (Signed)
It is critically important that you take her hypertensive medications as prescribed.  Please follow with your primary care doctor in the next 2 days for a check-up. They must obtain records for further management.   Do not hesitate to return to the Emergency Department for any new, worsening or concerning symptoms.
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
## 2016-08-16 NOTE — ED Notes (Signed)
Took pt to bathroom via wheelchair 

## 2016-08-18 ENCOUNTER — Telehealth: Payer: Self-pay

## 2016-08-18 MED FILL — hydrALAZINE HCL 25 MG TABS: 25 | 30 days supply | Qty: 90 | Fill #0

## 2016-08-18 MED FILL — ?AMLODIPINE BESYLATE 5 MG T: 5 | 30 days supply | Qty: 30 | Fill #0

## 2016-08-18 NOTE — Telephone Encounter (Signed)
-----   Message from Arnoldo Morale, MD sent at 08/14/2016  6:28 PM EDT ----- His lower extremity doppler was negative for DVT but revealed some mildly enlarged lymph nodes in the groin which could be evidence of infection or inflammation.

## 2016-08-18 NOTE — Telephone Encounter (Signed)
Message left requested return call to [facility]. Priscille Heidelberg, RN, BSN

## 2016-08-19 ENCOUNTER — Telehealth: Payer: Self-pay

## 2016-08-19 NOTE — Telephone Encounter (Signed)
Call received from the patient. He stated that he called [facility] in [facility] and was informed that there are not any beds available today and he will need to check back tomorrow. He also stated that he asked about transportation to [facility] for medical appointments and was told that the volunteers only provide transportation for veterans. He said that he will stay with a friend tonight but tomorrow he will have to be out of her house, Encouraged him to go to the [facility] and he stated that he really doesn't want to go. Reminded him that he needs to apply for the [facility] and then would be able to get transportation to his medical appointments. However, he will need a letter from the [facility] stating that he is homeless.  He stated that he understood and was agreeable to speaking to the financial counselor about the [employer] application process. The call was then transferred to Johnnette Barrios, [facility] Financial Counselor.

## 2016-08-19 NOTE — Telephone Encounter (Signed)
Pt called the office, returning nurse's call. Please follow up.

## 2016-08-19 NOTE — Telephone Encounter (Signed)
Returned call to patient at [phone] phone rang greater than 5 times, no answer.
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
## 2016-08-19 NOTE — Telephone Encounter (Signed)
Pt. Called requesting to speak with you. Please f/u

## 2016-08-20 ENCOUNTER — Telehealth: Payer: Self-pay | Admitting: Family Medicine

## 2016-08-20 NOTE — Telephone Encounter (Signed)
Pt. Called requesting to speak with the case manager. Please f/u

## 2016-08-20 NOTE — Telephone Encounter (Signed)
This Case Manager saw patient in clinic when patient meeting with Development worker, community. Patient was approved for [employer]. Patient reminded of his appointment on 08/21/16 at 1130 with Dr. Jarold Song, and patient indicated he plans to ask his friend Valere Dross if he can bring him to his appointment.   Patient called clinic and asked this Case Manager to return his phone call. This Case Manager placed call to patient, and patient indicated his friend, Valere Dross, would not be able to bring him to his appointment. Patient indicated he would be staying with his friend and will need transportation to his appointment. Friend's address is: [address] [place], [place], [place] [address]). Will notify Clinic Scheduler of patient's need for transportation to his upcoming appointment. Reminded patient to bring all medications to his upcoming appointment. Patient verbalized understanding.

## 2016-08-21 ENCOUNTER — Ambulatory Visit: Payer: Medicaid Other | Attending: Family Medicine | Admitting: Family Medicine

## 2016-08-21 ENCOUNTER — Encounter: Payer: Self-pay | Admitting: Family Medicine

## 2016-08-21 VITALS — BP 122/80 | HR 102 | Temp 97.3°F | Ht 71.0 in | Wt 195.6 lb

## 2016-08-21 DIAGNOSIS — E1121 Type 2 diabetes mellitus with diabetic nephropathy: Secondary | ICD-10-CM | POA: Insufficient documentation

## 2016-08-21 DIAGNOSIS — E1122 Type 2 diabetes mellitus with diabetic chronic kidney disease: Secondary | ICD-10-CM | POA: Diagnosis not present

## 2016-08-21 DIAGNOSIS — E1140 Type 2 diabetes mellitus with diabetic neuropathy, unspecified: Secondary | ICD-10-CM

## 2016-08-21 DIAGNOSIS — N183 Chronic kidney disease, stage 3 unspecified: Secondary | ICD-10-CM

## 2016-08-21 DIAGNOSIS — Z8781 Personal history of (healed) traumatic fracture: Secondary | ICD-10-CM

## 2016-08-21 DIAGNOSIS — Z5900 Homelessness unspecified: Secondary | ICD-10-CM | POA: Insufficient documentation

## 2016-08-21 DIAGNOSIS — Z79899 Other long term (current) drug therapy: Secondary | ICD-10-CM | POA: Insufficient documentation

## 2016-08-21 DIAGNOSIS — I5032 Chronic diastolic (congestive) heart failure: Secondary | ICD-10-CM | POA: Diagnosis not present

## 2016-08-21 DIAGNOSIS — Z967 Presence of other bone and tendon implants: Secondary | ICD-10-CM

## 2016-08-21 DIAGNOSIS — Z794 Long term (current) use of insulin: Secondary | ICD-10-CM | POA: Diagnosis not present

## 2016-08-21 DIAGNOSIS — Z9889 Other specified postprocedural states: Secondary | ICD-10-CM | POA: Insufficient documentation

## 2016-08-21 DIAGNOSIS — E876 Hypokalemia: Secondary | ICD-10-CM | POA: Insufficient documentation

## 2016-08-21 DIAGNOSIS — Z888 Allergy status to other drugs, medicaments and biological substances status: Secondary | ICD-10-CM | POA: Insufficient documentation

## 2016-08-21 DIAGNOSIS — K219 Gastro-esophageal reflux disease without esophagitis: Secondary | ICD-10-CM | POA: Insufficient documentation

## 2016-08-21 DIAGNOSIS — IMO0002 Reserved for concepts with insufficient information to code with codable children: Secondary | ICD-10-CM

## 2016-08-21 DIAGNOSIS — E1165 Type 2 diabetes mellitus with hyperglycemia: Secondary | ICD-10-CM

## 2016-08-21 DIAGNOSIS — Z23 Encounter for immunization: Secondary | ICD-10-CM

## 2016-08-21 LAB — POCT GLYCOSYLATED HEMOGLOBIN (HGB A1C): Hemoglobin A1C: 8.9

## 2016-08-21 LAB — GLUCOSE, POCT (MANUAL RESULT ENTRY): POC Glucose: 393 mg/dl — AB (ref 70–99)

## 2016-08-21 MED ORDER — INSULIN GLARGINE 100 UNIT/ML ~~LOC~~ SOLN: 60.0000 [IU] | Freq: Two times a day (BID) | SUBCUTANEOUS | 3 refills | Status: DC

## 2016-08-21 MED ORDER — GABAPENTIN 300 MG PO CAPS: 900.0000 mg | ORAL_CAPSULE | Freq: Two times a day (BID) | ORAL | 3 refills | Status: DC

## 2016-08-21 MED FILL — GABAPENTIN 300 MG CAPSULE: 300 | 30 days supply | Qty: 180 | Fill #1
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
## 2016-08-21 NOTE — Progress Notes (Signed)
"  Slept outside for the past 4 nights behind a dumpster" Medication refills

## 2016-08-21 NOTE — Progress Notes (Signed)
This Case Manager spoke with patient while patient in clinic for office visit with Dr. Jarold Song. Patient indicated he can no longer stay with his friend and will not have a place to stay tonight. Inquired if patient had contacted area shelters for availability, and patient indicated he had but cannot find a bed. Inquired if patient called [facility], and patient said he had but was worried he would not be able to get to his medical appointments. He indicated he was informed transportation to appointments was only for [employer]. This Case Manager informed patient that area shelters will be contacted to determine if they have any availability.  Placed call to [facility] and spoke with Burman Nieves at [employer]. She indicated [employer] is full and does not have any availability. Placed call to [facility] to determine [phone] to determine if they had any availability. Unable to speak with anyone; voicemail left requesting call back to his Case Manager.  Call placed to [facility] [phone]) in [facility] and spoke with [name] who indicated shelter does have availability. Informed [name] that patient was concerned that he may not be able to get to his upcoming medical appointments, and [name] indicated they have interns that can provide transportation to appointments in [place].  [name] indicated patient should ask for him when he gets to the shelter.   This Case Manager updated patient of availability at [facility]. Patient hesitant to going to [facility]. Informed him that [facility] does not have any availability. Patient appreciative of help. Inquired if patient wanted cab to take him there. Patient declined and indicated he wanted to go back to his friend's apartment and get his belongings. He indicated he would get a ride from a friend to shelter. Provided patient with shelter address, phone number, and informed him  to ask for [name] on second floor of building when he gets there. Patient appreciative. No additional needs identified.

## 2016-08-21 NOTE — Patient Instructions (Signed)
Diabetes Mellitus and Food It is important for you to manage your blood sugar (glucose) level. Your blood glucose level can be greatly affected by what you eat. Eating healthier foods in the appropriate amounts throughout the day at about the same time each day will help you control your blood glucose level. It can also help slow or prevent worsening of your diabetes mellitus. Healthy eating may even help you improve the level of your blood pressure and reach or maintain a healthy weight.  General recommendations for healthful eating and cooking habits include:  Eating meals and snacks regularly. Avoid going long periods of time without eating to lose weight.  Eating a diet that consists mainly of plant-based foods, such as fruits, vegetables, nuts, legumes, and whole grains.  Using low-heat cooking methods, such as baking, instead of high-heat cooking methods, such as deep frying. Work with your dietitian to make sure you understand how to use the Nutrition Facts information on food labels. HOW CAN FOOD AFFECT ME? Carbohydrates Carbohydrates affect your blood glucose level more than any other type of food. Your dietitian will help you determine how many carbohydrates to eat at each meal and teach you how to count carbohydrates. Counting carbohydrates is important to keep your blood glucose at a healthy level, especially if you are using insulin or taking certain medicines for diabetes mellitus. Alcohol Alcohol can cause sudden decreases in blood glucose (hypoglycemia), especially if you use insulin or take certain medicines for diabetes mellitus. Hypoglycemia can be a life-threatening condition. Symptoms of hypoglycemia (sleepiness, dizziness, and disorientation) are similar to symptoms of having too much alcohol.  If your health care provider has given you approval to drink alcohol, do so in moderation and use the following guidelines:  Women should not have more than one drink per day, and men  should not have more than two drinks per day. One drink is equal to:  12 oz of beer.  5 oz of wine.  1 oz of hard liquor.  Do not drink on an empty stomach.  Keep yourself hydrated. Have water, diet soda, or unsweetened iced tea.  Regular soda, juice, and other mixers might contain a lot of carbohydrates and should be counted. WHAT FOODS ARE NOT RECOMMENDED? As you make food choices, it is important to remember that all foods are not the same. Some foods have fewer nutrients per serving than other foods, even though they might have the same number of calories or carbohydrates. It is difficult to get your body what it needs when you eat foods with fewer nutrients. Examples of foods that you should avoid that are high in calories and carbohydrates but low in nutrients include:  Trans fats (most processed foods list trans fats on the Nutrition Facts label).  Regular soda.  Juice.  Candy.  Sweets, such as cake, pie, doughnuts, and cookies.  Fried foods. WHAT FOODS CAN I EAT? Eat nutrient-rich foods, which will nourish your body and keep you healthy. The food you should eat also will depend on several factors, including:  The calories you need.  The medicines you take.  Your weight.  Your blood glucose level.  Your blood pressure level.  Your cholesterol level. You should eat a variety of foods, including:  Protein.  Lean cuts of meat.  Proteins low in saturated fats, such as fish, egg whites, and beans. Avoid processed meats.  Fruits and vegetables.  Fruits and vegetables that may help control blood glucose levels, such as apples, mangoes, and   yams.  Dairy products.  Choose fat-free or low-fat dairy products, such as milk, yogurt, and cheese.  Grains, bread, pasta, and rice.  Choose whole grain products, such as multigrain bread, whole oats, and brown rice. These foods may help control blood pressure.  Fats.  Foods containing healthful fats, such as nuts,  avocado, olive oil, canola oil, and fish. DOES EVERYONE WITH DIABETES MELLITUS HAVE THE SAME MEAL PLAN? Because every person with diabetes mellitus is different, there is not one meal plan that works for everyone. It is very important that you meet with a dietitian who will help you create a meal plan that is just right for you.   This information is not intended to replace advice given to you by your health care provider. Make sure you discuss any questions you have with your health care provider.   Document Released: 09/04/2005 Document Revised: 12/29/2014 Document Reviewed: 11/04/2013 Elsevier Interactive Patient Education 2016 Elsevier Inc.  

## 2016-08-22 DIAGNOSIS — Z23 Encounter for immunization: Secondary | ICD-10-CM

## 2016-08-29 ENCOUNTER — Other Ambulatory Visit: Payer: Self-pay | Admitting: *Deleted

## 2016-08-29 MED ORDER — INSULIN GLARGINE 100 UNIT/ML SOLOSTAR PEN: 110.0000 [IU] | PEN_INJECTOR | Freq: Every morning | SUBCUTANEOUS | 3 refills | Status: DC

## 2016-08-29 NOTE — Telephone Encounter (Signed)
PRINTED FOR PASS PROGRAM 

## 2016-09-02 ENCOUNTER — Telehealth: Payer: Self-pay

## 2016-09-02 NOTE — Telephone Encounter (Signed)
Call received from the patient. He reported that he had been sleeping on the street for " about 2 weeks" and he is now staying at the [facility].  He said that his sponsor took him to the motel is helping him pay the bill for the motel.  He noted that he has fast food restaurants that he can walk to even though it is difficult for him to walk due to his ankle  injury. He said that he has lost everything that he had except for minimal clothing and his medications.   He stated that he received a call from [facility] and they requested more information that he was able to provide. He also reported that he needs to contact his attorney to follow up on the status of his disability.    He said that he has called the [facility] and there are no beds available right now. He also stated that he has called [facility] in [facility] and was told by the director that they will not provide transportation to his medical appointments in [place] unless he is a English as a second language teacher. He stated that he understands that this is not the same information regarding transportation that the director of the shelter provided to Carmela Hurt, RN and myself.   He said that the [facility]) is not able to help him with finding permanent housing and the [employer] is not able to help him until after January 1,2018.  He does not have an income, so the [employer] is not able to assist him.  He noted that he may need transportation to the [facility] appointment next week.  CM to call him 09/09/16 , the day prior to the appointment, to confirm the transportation arrangements.  He did not have any further questions/concerns at this time.  He said that he was going to rest and elevate his right foot as the damp, cool weather is causing him to have more ankle pain today.

## 2016-09-05 NOTE — Telephone Encounter (Signed)
This Case Manager received return call from patient. Patient indicated he had a follow-up appointment with his Orthopedic Surgeon today. He indicated he had an X-ray and was told his right ankle was "healing fine."    Patient indicated he is now staying at the [facility] but hopes to find a new place to stay soon because he has seen bugs in his room. Encouraged patient to contact area shelters daily to determine if they have availability. Patient verbalized understanding. Patient aware of his upcoming appointment with Dr. Jarold Song on 09/10/16 at 1030, and patient indicated he would need transportation to his appointment. Informed patient that he will be called next week to determine where he is staying so transportation can be arranged. Patient verbalized understanding.

## 2016-09-05 NOTE — Telephone Encounter (Signed)
Return call placed to patient; however, unable to reach patient. HIPPA compliant voicemail left requesting return call.

## 2016-09-05 NOTE — Telephone Encounter (Signed)
Pt. Called requesting to speak with Janett Billow.  Pt. States it is important. Please f/u

## 2016-09-09 ENCOUNTER — Telehealth: Payer: Self-pay

## 2016-09-09 ENCOUNTER — Ambulatory Visit: Payer: Self-pay | Admitting: Internal Medicine

## 2016-09-09 NOTE — Telephone Encounter (Signed)
Attempted again  to contact the patient to check on his status and to remind him of his appointment at the [facility] tomorrow, 09/10/16 @ 1030. Call placed to # [phone] (M) x 2  and the messages states that " the number you are trying to call is not reachable."

## 2016-09-09 NOTE — Telephone Encounter (Signed)
Attempted to contact the patient to check on his status and to confirm his appointment tomorrow, 09/10/16 @ 1030 @ [facility] and to inquire if he needs transportation to the clinic.  Call placed to # [phone] (M) and the message states that the number is not reachable.

## 2016-09-10 ENCOUNTER — Ambulatory Visit: Payer: Self-pay | Admitting: Family Medicine

## 2016-09-10 ENCOUNTER — Other Ambulatory Visit: Payer: Self-pay | Admitting: Family Medicine

## 2016-09-11 ENCOUNTER — Telehealth: Payer: Self-pay

## 2016-09-11 ENCOUNTER — Emergency Department (HOSPITAL_COMMUNITY): Payer: Medicaid Other

## 2016-09-11 ENCOUNTER — Encounter (HOSPITAL_COMMUNITY): Payer: Self-pay | Admitting: Emergency Medicine

## 2016-09-11 ENCOUNTER — Emergency Department (HOSPITAL_COMMUNITY)
Admission: EM | Admit: 2016-09-11 | Discharge: 2016-09-11 | Disposition: A | Discharge status: Home / Self Care | Payer: Medicaid Other | Attending: Emergency Medicine | Admitting: Emergency Medicine

## 2016-09-11 DIAGNOSIS — Z96652 Presence of left artificial knee joint: Secondary | ICD-10-CM | POA: Insufficient documentation

## 2016-09-11 DIAGNOSIS — I130 Hypertensive heart and chronic kidney disease with heart failure and stage 1 through stage 4 chronic kidney disease, or unspecified chronic kidney disease: Secondary | ICD-10-CM | POA: Diagnosis not present

## 2016-09-11 DIAGNOSIS — R079 Chest pain, unspecified: Secondary | ICD-10-CM | POA: Diagnosis present

## 2016-09-11 DIAGNOSIS — F17210 Nicotine dependence, cigarettes, uncomplicated: Secondary | ICD-10-CM | POA: Insufficient documentation

## 2016-09-11 DIAGNOSIS — E1122 Type 2 diabetes mellitus with diabetic chronic kidney disease: Secondary | ICD-10-CM | POA: Insufficient documentation

## 2016-09-11 DIAGNOSIS — Z79899 Other long term (current) drug therapy: Secondary | ICD-10-CM | POA: Diagnosis not present

## 2016-09-11 DIAGNOSIS — M25571 Pain in right ankle and joints of right foot: Secondary | ICD-10-CM | POA: Diagnosis not present

## 2016-09-11 DIAGNOSIS — Z794 Long term (current) use of insulin: Secondary | ICD-10-CM | POA: Diagnosis not present

## 2016-09-11 DIAGNOSIS — N183 Chronic kidney disease, stage 3 (moderate): Secondary | ICD-10-CM | POA: Insufficient documentation

## 2016-09-11 DIAGNOSIS — E1140 Type 2 diabetes mellitus with diabetic neuropathy, unspecified: Secondary | ICD-10-CM | POA: Insufficient documentation

## 2016-09-11 DIAGNOSIS — I2510 Atherosclerotic heart disease of native coronary artery without angina pectoris: Secondary | ICD-10-CM | POA: Insufficient documentation

## 2016-09-11 DIAGNOSIS — Z8546 Personal history of malignant neoplasm of prostate: Secondary | ICD-10-CM | POA: Insufficient documentation

## 2016-09-11 DIAGNOSIS — I5032 Chronic diastolic (congestive) heart failure: Secondary | ICD-10-CM | POA: Insufficient documentation

## 2016-09-11 DIAGNOSIS — Z7982 Long term (current) use of aspirin: Secondary | ICD-10-CM | POA: Diagnosis not present

## 2016-09-11 LAB — I-STAT TROPONIN, ED: Troponin i, poc: 0.05 ng/mL (ref 0.00–0.08)

## 2016-09-11 LAB — BASIC METABOLIC PANEL
Anion gap: 10 (ref 5–15)
BUN: 14 mg/dL (ref 6–20)
CO2: 28 mmol/L (ref 22–32)
Calcium: 7.9 mg/dL — ABNORMAL LOW (ref 8.9–10.3)
Chloride: 91 mmol/L — ABNORMAL LOW (ref 101–111)
Creatinine, Ser: 1.64 mg/dL — ABNORMAL HIGH (ref 0.61–1.24)
GFR calc Af Amer: 52 mL/min — ABNORMAL LOW (ref >60)
GFR calc non Af Amer: 45 mL/min — ABNORMAL LOW (ref >60)
Glucose, Bld: 283 mg/dL — ABNORMAL HIGH (ref 65–99)
Potassium: 3.8 mmol/L (ref 3.5–5.1)
Sodium: 129 mmol/L — ABNORMAL LOW (ref 135–145)

## 2016-09-11 LAB — CBC
HCT: 37.6 % — ABNORMAL LOW (ref 39.0–52.0)
Hemoglobin: 12.7 g/dL — ABNORMAL LOW (ref 13.0–17.0)
MCH: 31.8 pg (ref 26.0–34.0)
MCHC: 33.8 g/dL (ref 30.0–36.0)
MCV: 94 fL (ref 78.0–100.0)
Platelets: 190 10*3/uL (ref 150–400)
RBC: 4 MIL/uL — ABNORMAL LOW (ref 4.22–5.81)
RDW: 13 % (ref 11.5–15.5)
WBC: 8 10*3/uL (ref 4.0–10.5)

## 2016-09-11 MED ORDER — LIDOCAINE 5 % EX PTCH
1.0000 | MEDICATED_PATCH | Freq: Once | CUTANEOUS | Status: DC
  Administered 2016-09-11: 1 via TRANSDERMAL
  Filled 2016-09-11: qty 1

## 2016-09-11 MED ORDER — KETOROLAC TROMETHAMINE 60 MG/2ML IM SOLN
30.0000 mg | Freq: Once | INTRAMUSCULAR | Status: AC
  Administered 2016-09-11: 30 mg via INTRAMUSCULAR
  Filled 2016-09-11: qty 2

## 2016-09-11 MED ORDER — OXYCODONE HCL ER 10 MG PO T12A: 10.0000 mg | EXTENDED_RELEASE_TABLET | Freq: Once | ORAL | Status: DC

## 2016-09-11 MED ORDER — ACETAMINOPHEN 500 MG PO TABS
1000.0000 mg | ORAL_TABLET | Freq: Once | ORAL | Status: AC
  Administered 2016-09-11: 1000 mg via ORAL
  Filled 2016-09-11: qty 2

## 2016-09-11 NOTE — ED Triage Notes (Signed)
Per [facility]: Patient to ED from home c/o central, non-radiating CP x 1 hour, no accompanying symptoms. Per EMS, pt is 4-weeks post-op on R ankle - swollen and hot today. ETOH on board this evening. EMS VS: HR 78 NSR, 122/84, RR 18, 99% RA. Pt received 1 NTG and 324 ASA en route. Pt A&O x 4, respirations e/u.

## 2016-09-11 NOTE — Telephone Encounter (Signed)
This Case Manager attempted to place call to patient to reschedule appointment with Dr. Jarold Song as patient missed appointment on 09/10/16. Call placed to [phone]; however, recording stated "the number you are trying to call is not reachable."
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
## 2016-09-11 NOTE — ED Notes (Signed)
Patient transported to X-ray 

## 2016-09-11 NOTE — ED Notes (Signed)
Pt transported to xray 

## 2016-09-12 ENCOUNTER — Other Ambulatory Visit: Payer: Self-pay | Admitting: *Deleted

## 2016-09-12 MED ORDER — TRUE METRIX METER W/DEVICE KIT: 1.0000 | PACK | Freq: Three times a day (TID) | 0 refills | Status: DC

## 2016-09-12 NOTE — Telephone Encounter (Signed)
New meter has been ordered.

## 2016-09-15 ENCOUNTER — Telehealth: Payer: Self-pay

## 2016-09-15 NOTE — Telephone Encounter (Signed)
Attempted to contact the patient to check on his status and to discuss scheduling a follow up appointment at [facility]. Call placed to # [phone] (M) and the message states that the number that you are trying to call is unreachable.

## 2016-09-18 ENCOUNTER — Encounter (HOSPITAL_COMMUNITY): Payer: Self-pay

## 2016-09-18 ENCOUNTER — Emergency Department (HOSPITAL_COMMUNITY): Payer: Medicaid Other

## 2016-09-18 ENCOUNTER — Observation Stay (HOSPITAL_COMMUNITY): Payer: Medicaid Other

## 2016-09-18 ENCOUNTER — Observation Stay (HOSPITAL_COMMUNITY)
Admission: EM | Admit: 2016-09-18 | Discharge: 2016-09-20 | Disposition: A | Discharge status: Home / Self Care | Payer: Medicaid Other | Attending: Oncology | Admitting: Oncology

## 2016-09-18 DIAGNOSIS — Z96652 Presence of left artificial knee joint: Secondary | ICD-10-CM | POA: Insufficient documentation

## 2016-09-18 DIAGNOSIS — N183 Chronic kidney disease, stage 3 unspecified: Secondary | ICD-10-CM | POA: Diagnosis present

## 2016-09-18 DIAGNOSIS — M940 Chondrocostal junction syndrome [Tietze]: Secondary | ICD-10-CM | POA: Diagnosis not present

## 2016-09-18 DIAGNOSIS — E1142 Type 2 diabetes mellitus with diabetic polyneuropathy: Secondary | ICD-10-CM

## 2016-09-18 DIAGNOSIS — E1122 Type 2 diabetes mellitus with diabetic chronic kidney disease: Secondary | ICD-10-CM | POA: Insufficient documentation

## 2016-09-18 DIAGNOSIS — I10 Essential (primary) hypertension: Secondary | ICD-10-CM | POA: Diagnosis present

## 2016-09-18 DIAGNOSIS — F17210 Nicotine dependence, cigarettes, uncomplicated: Secondary | ICD-10-CM | POA: Diagnosis not present

## 2016-09-18 DIAGNOSIS — Z79899 Other long term (current) drug therapy: Secondary | ICD-10-CM | POA: Insufficient documentation

## 2016-09-18 DIAGNOSIS — R079 Chest pain, unspecified: Secondary | ICD-10-CM

## 2016-09-18 DIAGNOSIS — E118 Type 2 diabetes mellitus with unspecified complications: Secondary | ICD-10-CM

## 2016-09-18 DIAGNOSIS — Z794 Long term (current) use of insulin: Secondary | ICD-10-CM | POA: Insufficient documentation

## 2016-09-18 DIAGNOSIS — I5042 Chronic combined systolic (congestive) and diastolic (congestive) heart failure: Secondary | ICD-10-CM

## 2016-09-18 DIAGNOSIS — F14188 Cocaine abuse with other cocaine-induced disorder: Secondary | ICD-10-CM | POA: Diagnosis not present

## 2016-09-18 DIAGNOSIS — D649 Anemia, unspecified: Secondary | ICD-10-CM

## 2016-09-18 DIAGNOSIS — I639 Cerebral infarction, unspecified: Secondary | ICD-10-CM

## 2016-09-18 DIAGNOSIS — R200 Anesthesia of skin: Secondary | ICD-10-CM | POA: Insufficient documentation

## 2016-09-18 DIAGNOSIS — I2510 Atherosclerotic heart disease of native coronary artery without angina pectoris: Secondary | ICD-10-CM | POA: Diagnosis not present

## 2016-09-18 DIAGNOSIS — M25571 Pain in right ankle and joints of right foot: Secondary | ICD-10-CM

## 2016-09-18 DIAGNOSIS — F1910 Other psychoactive substance abuse, uncomplicated: Secondary | ICD-10-CM

## 2016-09-18 DIAGNOSIS — Z5900 Homelessness unspecified: Secondary | ICD-10-CM

## 2016-09-18 DIAGNOSIS — I252 Old myocardial infarction: Secondary | ICD-10-CM | POA: Diagnosis not present

## 2016-09-18 DIAGNOSIS — R0789 Other chest pain: Principal | ICD-10-CM | POA: Insufficient documentation

## 2016-09-18 DIAGNOSIS — E119 Type 2 diabetes mellitus without complications: Secondary | ICD-10-CM

## 2016-09-18 DIAGNOSIS — N179 Acute kidney failure, unspecified: Secondary | ICD-10-CM | POA: Diagnosis not present

## 2016-09-18 DIAGNOSIS — I428 Other cardiomyopathies: Secondary | ICD-10-CM

## 2016-09-18 DIAGNOSIS — I130 Hypertensive heart and chronic kidney disease with heart failure and stage 1 through stage 4 chronic kidney disease, or unspecified chronic kidney disease: Secondary | ICD-10-CM

## 2016-09-18 DIAGNOSIS — E876 Hypokalemia: Secondary | ICD-10-CM | POA: Diagnosis not present

## 2016-09-18 DIAGNOSIS — I5033 Acute on chronic diastolic (congestive) heart failure: Secondary | ICD-10-CM | POA: Diagnosis not present

## 2016-09-18 DIAGNOSIS — E1121 Type 2 diabetes mellitus with diabetic nephropathy: Secondary | ICD-10-CM | POA: Insufficient documentation

## 2016-09-18 DIAGNOSIS — F1911 Other psychoactive substance abuse, in remission: Secondary | ICD-10-CM | POA: Diagnosis present

## 2016-09-18 DIAGNOSIS — E1165 Type 2 diabetes mellitus with hyperglycemia: Secondary | ICD-10-CM

## 2016-09-18 DIAGNOSIS — F1010 Alcohol abuse, uncomplicated: Secondary | ICD-10-CM

## 2016-09-18 DIAGNOSIS — B182 Chronic viral hepatitis C: Secondary | ICD-10-CM | POA: Diagnosis not present

## 2016-09-18 DIAGNOSIS — Z888 Allergy status to other drugs, medicaments and biological substances status: Secondary | ICD-10-CM

## 2016-09-18 DIAGNOSIS — I5032 Chronic diastolic (congestive) heart failure: Secondary | ICD-10-CM | POA: Diagnosis present

## 2016-09-18 DIAGNOSIS — Z7982 Long term (current) use of aspirin: Secondary | ICD-10-CM | POA: Diagnosis not present

## 2016-09-18 DIAGNOSIS — Z8546 Personal history of malignant neoplasm of prostate: Secondary | ICD-10-CM | POA: Insufficient documentation

## 2016-09-18 DIAGNOSIS — F1410 Cocaine abuse, uncomplicated: Secondary | ICD-10-CM | POA: Diagnosis present

## 2016-09-18 DIAGNOSIS — K7460 Unspecified cirrhosis of liver: Secondary | ICD-10-CM

## 2016-09-18 DIAGNOSIS — IMO0002 Reserved for concepts with insufficient information to code with codable children: Secondary | ICD-10-CM

## 2016-09-18 DIAGNOSIS — R071 Chest pain on breathing: Secondary | ICD-10-CM | POA: Diagnosis present

## 2016-09-18 DIAGNOSIS — R269 Unspecified abnormalities of gait and mobility: Secondary | ICD-10-CM

## 2016-09-18 LAB — DIFFERENTIAL
Basophils Absolute: 0.1 10*3/uL (ref 0.0–0.1)
Basophils Relative: 1 %
Eosinophils Absolute: 0.1 10*3/uL (ref 0.0–0.7)
Eosinophils Relative: 1 %
Lymphocytes Relative: 43 %
Lymphs Abs: 3.7 10*3/uL (ref 0.7–4.0)
Monocytes Absolute: 0.8 10*3/uL (ref 0.1–1.0)
Monocytes Relative: 9 %
Neutro Abs: 3.9 10*3/uL (ref 1.7–7.7)
Neutrophils Relative %: 46 %

## 2016-09-18 LAB — LIPID PANEL
Cholesterol: 148 mg/dL (ref 0–200)
HDL: 40 mg/dL — ABNORMAL LOW (ref >40)
LDL Cholesterol: 92 mg/dL (ref 0–99)
Total CHOL/HDL Ratio: 3.7 RATIO
Triglycerides: 81 mg/dL (ref <150)
VLDL: 16 mg/dL (ref 0–40)

## 2016-09-18 LAB — COMPREHENSIVE METABOLIC PANEL
ALT: 28 U/L (ref 17–63)
AST: 32 U/L (ref 15–41)
Albumin: 3.1 g/dL — ABNORMAL LOW (ref 3.5–5.0)
Alkaline Phosphatase: 88 U/L (ref 38–126)
Anion gap: 9 (ref 5–15)
BUN: 16 mg/dL (ref 6–20)
CO2: 24 mmol/L (ref 22–32)
Calcium: 8.6 mg/dL — ABNORMAL LOW (ref 8.9–10.3)
Chloride: 99 mmol/L — ABNORMAL LOW (ref 101–111)
Creatinine, Ser: 1.86 mg/dL — ABNORMAL HIGH (ref 0.61–1.24)
GFR calc Af Amer: 45 mL/min — ABNORMAL LOW (ref >60)
GFR calc non Af Amer: 39 mL/min — ABNORMAL LOW (ref >60)
Glucose, Bld: 195 mg/dL — ABNORMAL HIGH (ref 65–99)
Potassium: 3.1 mmol/L — ABNORMAL LOW (ref 3.5–5.1)
Sodium: 132 mmol/L — ABNORMAL LOW (ref 135–145)
Total Bilirubin: 1.2 mg/dL (ref 0.3–1.2)
Total Protein: 7.9 g/dL (ref 6.5–8.1)

## 2016-09-18 LAB — GLUCOSE, CAPILLARY
Glucose-Capillary: 268 mg/dL — ABNORMAL HIGH (ref 65–99)
Glucose-Capillary: 235 mg/dL — ABNORMAL HIGH (ref 65–99)

## 2016-09-18 LAB — RAPID URINE DRUG SCREEN, HOSP PERFORMED
Amphetamines: NOT DETECTED
Barbiturates: NOT DETECTED
Benzodiazepines: NOT DETECTED
Cocaine: POSITIVE — AB
Opiates: NOT DETECTED
Tetrahydrocannabinol: NOT DETECTED

## 2016-09-18 LAB — CBC
HCT: 36.7 % — ABNORMAL LOW (ref 39.0–52.0)
Hemoglobin: 12.6 g/dL — ABNORMAL LOW (ref 13.0–17.0)
MCH: 32.1 pg (ref 26.0–34.0)
MCHC: 34.3 g/dL (ref 30.0–36.0)
MCV: 93.6 fL (ref 78.0–100.0)
Platelets: 184 10*3/uL (ref 150–400)
RBC: 3.92 MIL/uL — ABNORMAL LOW (ref 4.22–5.81)
RDW: 13.1 % (ref 11.5–15.5)
WBC: 8.5 10*3/uL (ref 4.0–10.5)

## 2016-09-18 LAB — MAGNESIUM: Magnesium: 1.6 mg/dL — ABNORMAL LOW (ref 1.7–2.4)

## 2016-09-18 LAB — I-STAT CHEM 8, ED
BUN: 18 mg/dL (ref 6–20)
Calcium, Ion: 1.06 mmol/L — ABNORMAL LOW (ref 1.15–1.40)
Chloride: 98 mmol/L — ABNORMAL LOW (ref 101–111)
Creatinine, Ser: 2 mg/dL — ABNORMAL HIGH (ref 0.61–1.24)
Glucose, Bld: 193 mg/dL — ABNORMAL HIGH (ref 65–99)
HCT: 39 % (ref 39.0–52.0)
Hemoglobin: 13.3 g/dL (ref 13.0–17.0)
Potassium: 3.1 mmol/L — ABNORMAL LOW (ref 3.5–5.1)
Sodium: 136 mmol/L (ref 135–145)
TCO2: 25 mmol/L (ref 0–100)

## 2016-09-18 LAB — CBG MONITORING, ED: Glucose-Capillary: 179 mg/dL — ABNORMAL HIGH (ref 65–99)

## 2016-09-18 LAB — APTT: aPTT: 26 seconds (ref 24–36)

## 2016-09-18 LAB — BRAIN NATRIURETIC PEPTIDE: B Natriuretic Peptide: 33.3 pg/mL (ref 0.0–100.0)

## 2016-09-18 LAB — TROPONIN I
Troponin I: 0.08 ng/mL (ref <0.03)
Troponin I: 0.08 ng/mL (ref <0.03)
Troponin I: 0.08 ng/mL (ref <0.03)

## 2016-09-18 LAB — I-STAT TROPONIN, ED: Troponin i, poc: 0.06 ng/mL (ref 0.00–0.08)

## 2016-09-18 LAB — FERRITIN: Ferritin: 330 ng/mL (ref 24–336)

## 2016-09-18 LAB — PROTIME-INR
INR: 1.12
Prothrombin Time: 14.5 seconds (ref 11.4–15.2)

## 2016-09-18 MED ORDER — GABAPENTIN 300 MG PO CAPS
900.0000 mg | ORAL_CAPSULE | Freq: Two times a day (BID) | ORAL | Status: DC
  Administered 2016-09-18 – 2016-09-20 (×4): 900 mg via ORAL
  Filled 2016-09-18 (×4): qty 3

## 2016-09-18 MED ORDER — ASPIRIN 325 MG PO TABS
325.0000 mg | ORAL_TABLET | Freq: Every day | ORAL | Status: DC
  Administered 2016-09-18 – 2016-09-20 (×3): 325 mg via ORAL
  Filled 2016-09-18 (×3): qty 1

## 2016-09-18 MED ORDER — POTASSIUM CHLORIDE CRYS ER 20 MEQ PO TBCR
40.0000 meq | EXTENDED_RELEASE_TABLET | Freq: Once | ORAL | Status: AC
  Administered 2016-09-18: 40 meq via ORAL
  Filled 2016-09-18: qty 2

## 2016-09-18 MED ORDER — MORPHINE SULFATE (PF) 2 MG/ML IV SOLN
1.0000 mg | INTRAVENOUS | Status: DC | PRN
  Administered 2016-09-18 (×2): 1 mg via INTRAVENOUS
  Filled 2016-09-18 (×2): qty 1

## 2016-09-18 MED ORDER — AMITRIPTYLINE HCL 25 MG PO TABS
75.0000 mg | ORAL_TABLET | Freq: Every day | ORAL | Status: DC
  Administered 2016-09-18 – 2016-09-19 (×2): 75 mg via ORAL
  Filled 2016-09-18 (×2): qty 3

## 2016-09-18 MED ORDER — STROKE: EARLY STAGES OF RECOVERY BOOK
Freq: Once | Status: AC
  Administered 2016-09-18: 23:00:00
  Filled 2016-09-18 (×2): qty 1

## 2016-09-18 MED ORDER — ADULT MULTIVITAMIN W/MINERALS CH
1.0000 | ORAL_TABLET | Freq: Every day | ORAL | Status: DC
  Administered 2016-09-18 – 2016-09-20 (×3): 1 via ORAL
  Filled 2016-09-18 (×3): qty 1

## 2016-09-18 MED ORDER — INSULIN GLARGINE 100 UNIT/ML ~~LOC~~ SOLN
50.0000 [IU] | Freq: Every day | SUBCUTANEOUS | Status: DC
  Administered 2016-09-18 – 2016-09-19 (×2): 50 [IU] via SUBCUTANEOUS
  Filled 2016-09-18 (×3): qty 0.5

## 2016-09-18 MED ORDER — ONDANSETRON HCL 4 MG PO TABS: 4.0000 mg | ORAL_TABLET | Freq: Four times a day (QID) | ORAL | Status: DC | PRN

## 2016-09-18 MED ORDER — ASPIRIN 300 MG RE SUPP: 300.0000 mg | Freq: Every day | RECTAL | Status: DC

## 2016-09-18 MED ORDER — ONDANSETRON HCL 4 MG/2ML IJ SOLN: 4.0000 mg | Freq: Four times a day (QID) | INTRAMUSCULAR | Status: DC | PRN

## 2016-09-18 MED ORDER — INSULIN ASPART 100 UNIT/ML ~~LOC~~ SOLN
0.0000 [IU] | Freq: Three times a day (TID) | SUBCUTANEOUS | Status: DC
  Administered 2016-09-18 – 2016-09-19 (×2): 8 [IU] via SUBCUTANEOUS
  Administered 2016-09-19: 2 [IU] via SUBCUTANEOUS
  Administered 2016-09-19: 5 [IU] via SUBCUTANEOUS
  Administered 2016-09-20 (×2): 3 [IU] via SUBCUTANEOUS

## 2016-09-18 MED ORDER — INSULIN ASPART 100 UNIT/ML ~~LOC~~ SOLN
0.0000 [IU] | Freq: Every day | SUBCUTANEOUS | Status: DC
  Administered 2016-09-18 – 2016-09-19 (×2): 2 [IU] via SUBCUTANEOUS

## 2016-09-18 MED ORDER — HEPARIN SODIUM (PORCINE) 5000 UNIT/ML IJ SOLN
5000.0000 [IU] | Freq: Three times a day (TID) | INTRAMUSCULAR | Status: DC
  Administered 2016-09-18 – 2016-09-20 (×4): 5000 [IU] via SUBCUTANEOUS
  Filled 2016-09-18 (×5): qty 1

## 2016-09-18 MED ORDER — FENTANYL CITRATE (PF) 100 MCG/2ML IJ SOLN
25.0000 ug | Freq: Once | INTRAMUSCULAR | Status: AC
  Administered 2016-09-18: 25 ug via INTRAVENOUS
  Filled 2016-09-18: qty 2

## 2016-09-18 MED ORDER — THIAMINE HCL 100 MG/ML IJ SOLN: 100.0000 mg | Freq: Every day | INTRAMUSCULAR | Status: DC

## 2016-09-18 MED ORDER — FOLIC ACID 1 MG PO TABS
1.0000 mg | ORAL_TABLET | Freq: Every day | ORAL | Status: DC
  Administered 2016-09-18 – 2016-09-20 (×3): 1 mg via ORAL
  Filled 2016-09-18 (×3): qty 1

## 2016-09-18 MED ORDER — LORAZEPAM 2 MG/ML IJ SOLN
INTRAMUSCULAR | Status: AC
  Filled 2016-09-18: qty 1

## 2016-09-18 MED ORDER — LORAZEPAM 2 MG/ML IJ SOLN: 1.0000 mg | Freq: Four times a day (QID) | INTRAMUSCULAR | Status: DC | PRN

## 2016-09-18 MED ORDER — LORAZEPAM 2 MG/ML IJ SOLN
0.5000 mg | Freq: Once | INTRAMUSCULAR | Status: AC
  Administered 2016-09-18: 0.5 mg via INTRAVENOUS

## 2016-09-18 MED ORDER — LORAZEPAM 1 MG PO TABS: 1.0000 mg | ORAL_TABLET | Freq: Four times a day (QID) | ORAL | Status: DC | PRN

## 2016-09-18 MED ORDER — ATORVASTATIN CALCIUM 40 MG PO TABS
40.0000 mg | ORAL_TABLET | Freq: Every day | ORAL | Status: DC
  Administered 2016-09-18 – 2016-09-19 (×2): 40 mg via ORAL
  Filled 2016-09-18 (×2): qty 1

## 2016-09-18 MED ORDER — NITROGLYCERIN 0.4 MG SL SUBL: 0.4000 mg | SUBLINGUAL_TABLET | SUBLINGUAL | Status: DC | PRN

## 2016-09-18 MED ORDER — TRAMADOL HCL 50 MG PO TABS
50.0000 mg | ORAL_TABLET | Freq: Two times a day (BID) | ORAL | Status: DC | PRN
  Administered 2016-09-19 – 2016-09-20 (×3): 50 mg via ORAL
  Filled 2016-09-18 (×3): qty 1

## 2016-09-18 MED ORDER — SODIUM CHLORIDE 0.9% FLUSH
3.0000 mL | Freq: Two times a day (BID) | INTRAVENOUS | Status: DC
  Administered 2016-09-18 – 2016-09-20 (×4): 3 mL via INTRAVENOUS

## 2016-09-18 MED ORDER — VITAMIN B-1 100 MG PO TABS
100.0000 mg | ORAL_TABLET | Freq: Every day | ORAL | Status: DC
  Administered 2016-09-18 – 2016-09-20 (×3): 100 mg via ORAL
  Filled 2016-09-18 (×3): qty 1

## 2016-09-18 MED ORDER — MORPHINE SULFATE (PF) 2 MG/ML IV SOLN
2.0000 mg | Freq: Once | INTRAVENOUS | Status: AC
  Administered 2016-09-18: 2 mg via INTRAVENOUS
  Filled 2016-09-18: qty 1

## 2016-09-18 NOTE — Progress Notes (Signed)
Patient had 11 beats of vtach, notified by ccmd, patient asymptomatic. MD paged to make aware. Will continue to monitor.

## 2016-09-18 NOTE — Progress Notes (Signed)
PT Cancellation Note  Patient Details Name: Patick Langell MRN: [id number] DOB: May 07, 1961   Cancelled Treatment:    Reason Eval/Treat Not Completed: Patient not medically ready   Patient's first troponin slightly elevated, pt with recent chest pain, and pt remains on bedrest. Will follow-up 9/29 and proceed with evaluation as appropriate.   Herold Salguero 09/18/2016, 4:15 PM Pager [phone]

## 2016-09-18 NOTE — ED Notes (Signed)
Pt to xray without distress.  

## 2016-09-18 NOTE — ED Notes (Signed)
Attempted report to 2W °

## 2016-09-18 NOTE — ED Notes (Signed)
Patient transported to MRI 
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
## 2016-09-18 NOTE — Progress Notes (Signed)
MD paged to clarify orders for stroke, after MRI results, Neuro checks d/c'd by MD. Will await further orders.

## 2016-09-18 NOTE — ED Notes (Signed)
Provider at bedside

## 2016-09-18 NOTE — ED Notes (Signed)
activated code stroke with [facility]- spoke with rhonda- LSN- 01:00-LT SIDED NUMBNESS. @ 07:57

## 2016-09-18 NOTE — ED Notes (Signed)
activated code stroke with [facility]- spoke with rhonda- LSN- 01:00-LT SIDED NUMBNESS.
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
## 2016-09-18 NOTE — ED Triage Notes (Signed)
Per [facility]: Pt is complaining of left sided facial numbness that started at 0100 today. Pt states that he was awake and that he was "trying to move my face and it wasn't acting right". EMS stated that the stroke screen was negative. They stated that the pt was also complaining of centralized chest pain that was non radiating. They stated that the pt was nauseated during the night. They gave the pt 324 ASA and 1 nitro. The pt stated that the nitro did not help with his chest pain. The nitro did drop the pts blood pressure to 90/50, they gave the pt about 200 ml of saline and the pts blood pressure increased to 123456 systolic.

## 2016-09-18 NOTE — ED Notes (Signed)
[facility] states they can't take pt with active chest pain.

## 2016-09-19 ENCOUNTER — Observation Stay (HOSPITAL_BASED_OUTPATIENT_CLINIC_OR_DEPARTMENT_OTHER): Payer: Medicaid Other

## 2016-09-19 DIAGNOSIS — F1990 Other psychoactive substance use, unspecified, uncomplicated: Secondary | ICD-10-CM

## 2016-09-19 DIAGNOSIS — I10 Essential (primary) hypertension: Secondary | ICD-10-CM

## 2016-09-19 DIAGNOSIS — E1165 Type 2 diabetes mellitus with hyperglycemia: Secondary | ICD-10-CM

## 2016-09-19 DIAGNOSIS — Z590 Homelessness: Secondary | ICD-10-CM

## 2016-09-19 DIAGNOSIS — R0789 Other chest pain: Secondary | ICD-10-CM | POA: Diagnosis not present

## 2016-09-19 DIAGNOSIS — E785 Hyperlipidemia, unspecified: Secondary | ICD-10-CM

## 2016-09-19 DIAGNOSIS — I639 Cerebral infarction, unspecified: Secondary | ICD-10-CM

## 2016-09-19 DIAGNOSIS — R200 Anesthesia of skin: Secondary | ICD-10-CM | POA: Diagnosis not present

## 2016-09-19 DIAGNOSIS — I5032 Chronic diastolic (congestive) heart failure: Secondary | ICD-10-CM

## 2016-09-19 DIAGNOSIS — N183 Chronic kidney disease, stage 3 (moderate): Secondary | ICD-10-CM

## 2016-09-19 DIAGNOSIS — F1410 Cocaine abuse, uncomplicated: Secondary | ICD-10-CM

## 2016-09-19 DIAGNOSIS — R079 Chest pain, unspecified: Secondary | ICD-10-CM

## 2016-09-19 DIAGNOSIS — E1121 Type 2 diabetes mellitus with diabetic nephropathy: Secondary | ICD-10-CM

## 2016-09-19 DIAGNOSIS — I2510 Atherosclerotic heart disease of native coronary artery without angina pectoris: Secondary | ICD-10-CM

## 2016-09-19 DIAGNOSIS — I252 Old myocardial infarction: Secondary | ICD-10-CM | POA: Diagnosis not present

## 2016-09-19 DIAGNOSIS — Z794 Long term (current) use of insulin: Secondary | ICD-10-CM

## 2016-09-19 LAB — BASIC METABOLIC PANEL
Anion gap: 7 (ref 5–15)
Anion gap: 7 (ref 5–15)
BUN: 16 mg/dL (ref 6–20)
BUN: 16 mg/dL (ref 6–20)
CO2: 26 mmol/L (ref 22–32)
CO2: 26 mmol/L (ref 22–32)
Calcium: 8.7 mg/dL — ABNORMAL LOW (ref 8.9–10.3)
Calcium: 8.8 mg/dL — ABNORMAL LOW (ref 8.9–10.3)
Chloride: 103 mmol/L (ref 101–111)
Chloride: 103 mmol/L (ref 101–111)
Creatinine, Ser: 1.66 mg/dL — ABNORMAL HIGH (ref 0.61–1.24)
Creatinine, Ser: 1.51 mg/dL — ABNORMAL HIGH (ref 0.61–1.24)
GFR calc Af Amer: 52 mL/min — ABNORMAL LOW (ref >60)
GFR calc Af Amer: 58 mL/min — ABNORMAL LOW (ref >60)
GFR calc non Af Amer: 45 mL/min — ABNORMAL LOW (ref >60)
GFR calc non Af Amer: 50 mL/min — ABNORMAL LOW (ref >60)
Glucose, Bld: 166 mg/dL — ABNORMAL HIGH (ref 65–99)
Glucose, Bld: 282 mg/dL — ABNORMAL HIGH (ref 65–99)
Potassium: 3.3 mmol/L — ABNORMAL LOW (ref 3.5–5.1)
Potassium: 3.4 mmol/L — ABNORMAL LOW (ref 3.5–5.1)
Sodium: 136 mmol/L (ref 135–145)
Sodium: 136 mmol/L (ref 135–145)

## 2016-09-19 LAB — HEMOGLOBIN A1C
Hgb A1c MFr Bld: 8.9 % — ABNORMAL HIGH (ref 4.8–5.6)
Mean Plasma Glucose: 209 mg/dL

## 2016-09-19 LAB — GLUCOSE, CAPILLARY
Glucose-Capillary: 149 mg/dL — ABNORMAL HIGH (ref 65–99)
Glucose-Capillary: 276 mg/dL — ABNORMAL HIGH (ref 65–99)
Glucose-Capillary: 228 mg/dL — ABNORMAL HIGH (ref 65–99)
Glucose-Capillary: 237 mg/dL — ABNORMAL HIGH (ref 65–99)

## 2016-09-19 MED ORDER — IBUPROFEN 600 MG PO TABS
600.0000 mg | ORAL_TABLET | Freq: Three times a day (TID) | ORAL | Status: DC
  Administered 2016-09-19 – 2016-09-20 (×3): 600 mg via ORAL
  Filled 2016-09-19 (×3): qty 1

## 2016-09-19 MED ORDER — POTASSIUM CHLORIDE CRYS ER 20 MEQ PO TBCR
40.0000 meq | EXTENDED_RELEASE_TABLET | Freq: Every day | ORAL | Status: DC
  Administered 2016-09-19 – 2016-09-20 (×2): 40 meq via ORAL
  Filled 2016-09-19 (×2): qty 2

## 2016-09-19 MED ORDER — INSULIN ASPART 100 UNIT/ML ~~LOC~~ SOLN
3.0000 [IU] | Freq: Three times a day (TID) | SUBCUTANEOUS | Status: DC
  Administered 2016-09-20 (×2): 3 [IU] via SUBCUTANEOUS

## 2016-09-19 MED ORDER — PANTOPRAZOLE SODIUM 40 MG PO TBEC
40.0000 mg | DELAYED_RELEASE_TABLET | Freq: Every day | ORAL | Status: DC
  Administered 2016-09-19 – 2016-09-20 (×2): 40 mg via ORAL
  Filled 2016-09-19 (×2): qty 1

## 2016-09-19 MED ORDER — MAGNESIUM SULFATE 2 GM/50ML IV SOLN
2.0000 g | Freq: Once | INTRAVENOUS | Status: AC
  Administered 2016-09-19: 2 g via INTRAVENOUS
  Filled 2016-09-19: qty 50

## 2016-09-19 NOTE — Progress Notes (Signed)
PT Cancellation Note  Patient Details Name: Yaret Pont MRN: [id number] DOB: May 07, 1961   Cancelled Treatment:    Reason Eval/Treat Not Completed: Patient at procedure or test/unavailable. Pt just transported out of room to ECHO. Pt also still has an active bedrest order. Please update activity order, if appropriate, for PT to proceed with eval.   Lorriane Shire 09/19/2016, 9:47 AM

## 2016-09-19 NOTE — Progress Notes (Signed)
Inpatient Diabetes Program Recommendations  AACE/ADA: New Consensus Statement on Inpatient Glycemic Control (2015)  Target Ranges:  Prepandial:   less than 140 mg/dL      Peak postprandial:   less than 180 mg/dL (1-2 hours)      Critically ill patients:  140 - 180 mg/dL   Lab Results  Component Value Date   GLUCAP 276 (H) 09/19/2016   HGBA1C 8.9 (H) 09/18/2016    Review of Glycemic Control  Post-prandial blood sugars elevated.  Inpatient Diabetes Program Recommendations:    Add Novolog 3 units tidwc for meal coverage insulin  Pt in vascular lab at present.  Will continue to follow. Thank you. Lorenda Peck, RD, LDN, CDE Inpatient Diabetes Coordinator [phone]
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
## 2016-09-19 NOTE — Hospital Discharge Follow-Up (Signed)
Transitional Care Clinic Care Coordination Note:  Admit date:  09/18/16 Discharge date: TBD Discharge Disposition: Home Patient contact: Patient indicates he misplaced his cell phone. Patient suggested that [facility] [phone] be contacted to reach him after discharge. Emergency contact(s): none  Patient known to the [facility] at [facility] and [facility]. This Case Manager met with patient to reiterate the  services and medical management that can be provided at the [facility]. Patient verbalized understanding and agreed to receive post-discharge care at the [facility] at [facility] and [facility]   Patient scheduled for Transitional Care appointment on 09/23/16 at 3:30 pm with Dr. Jarold Song.  Clinic information and appointment time provided to patient. Appointment information also placed on AVS.  Assessment:       Home Environment: Patient recently evicted from his apartment and is currently living at [facility]. Patient uncertain how long he will be staying at [facility].       Support System: friend, Valere Dross       Level of functioning: Independent       Home DME: none-now ambulating without walker       Home care services: none       Transportation: Patient indicates he does not have transportation to medical appointments. He indicated his friend Valere Dross works a lot and is unable to take him to appointments. Informed patient that [place] able to provide transportation to upcoming appointment. Patient verbalized understanding.        Food/Nutrition: Patient does not have access to needed food. Patient indicates he has often gone days without eating. Reminded patient of area food bank resources. Patient indicated he has not yet applied for Food Stamps. Encouraged patient to go to DSS to apply for Food Stamps. Patient indicated he plans to make it a priority to go to DSS once discharged. He indicated DSS is  walking distance from where he is currently living.        Medications: Patient indicates he was out of medications for about 2 weeks prior to admission. Patient indicated he needed refills of all medications. Patient uses [facility] and [employer] [facility] for medications. Reminded patient of [facility] pharmacy resources, and patient verbalized understanding. Informed Jacqlyn Krauss, RN he will need refills of all medications at discharge.         Identified Barriers: uninsured-Has [employer], living in a motel, lack of transportation, lack of food resources, lack of social support, medication refills needed.        PCP: Dr. Jarold Song [facility] and [place])             Arranged services:        Services communicated to Jacqlyn Krauss, RN CM

## 2016-09-19 NOTE — Progress Notes (Signed)
OT Cancellation Note  Patient Details Name: Brad [name] MRN: [id number] DOB: 07-22-1961   Cancelled Treatment:    Reason Eval/Treat Not Completed: Other (comment)  Patient at procedure or test/unavailable. Pt just transported out of room to ECHO. Pt also still has an active bedrest order. Please update activity order, if appropriate, for OT to proceed with eval.   [place], Thereasa Parkin 09/19/2016, 10:11 AM

## 2016-09-19 NOTE — Progress Notes (Signed)
*  PRELIMINARY RESULTS* Vascular Ultrasound Carotid Duplex (Doppler) has been completed.  Preliminary findings: Bilateral: No significant (1-39%) ICA stenosis. Antegrade vertebral flow.   Landry Mellow, RDMS, RVT  09/19/2016, 2:34 PM
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
## 2016-09-19 NOTE — Progress Notes (Signed)
STROKE TEAM PROGRESS NOTE   HISTORY OF PRESENT ILLNESS (per record) This is a 55-yo RH man who reports that he developed chest pain and jaw pain at about 0100 today 09/18/2016. This was accompanied by numbness of the left face, arm, and leg. He has no weakness, vision changes, difficulty speaking or swallowing, vertigo, double vision, or balance problems. He has had similar chest pain before but says he has never has numbness in the past. No known history of stroke. He has been out of his medications for the past week and his main concern right now is his chronic RLE pain for which he is requesting pain medication. NIHSS score: 1. Patient was not administered IV t-PA secondary to outside of window, minimal deficit. He was admitted for further evaluation and treatment.   SUBJECTIVE (INTERVAL HISTORY) No family is at the bedside.  Overall he feels his condition is stable. Has not taken meds for past 1.5 weeks as he is unable to afford. [facility]. He still complains of chest tightness. Left LE pain resolved but now right ankle pain.    OBJECTIVE Temp:  [97.6 F (36.4 C)-98.2 F (36.8 C)] 97.9 F (36.6 C) (09/29 0817) Pulse Rate:  [67-84] 72 (09/29 0817) Cardiac Rhythm: Normal sinus rhythm (09/28 2006) Resp:  [11-19] 18 (09/29 0512) BP: (131-168)/(65-102) 132/70 (09/29 0817) SpO2:  [91 %-100 %] 98 % (09/29 0817) Weight:  [194 lb 11.2 oz (88.3 kg)-195 lb 3.2 oz (88.5 kg)] 195 lb 3.2 oz (88.5 kg) (09/29 0512)  CBC:  Recent Labs Lab 09/18/16 0758 09/18/16 0820  WBC 8.5  --   NEUTROABS 3.9  --   HGB 12.6* 13.3  HCT 36.7* 39.0  MCV 93.6  --   PLT 184  --     Basic Metabolic Panel:  Recent Labs Lab 09/18/16 0757 09/18/16 0820 09/18/16 1325 09/19/16 0424  NA 132* 136  --  136  K 3.1* 3.1*  --  3.3*  CL 99* 98*  --  103  CO2 24  --   --  26  GLUCOSE 195* 193*  --  166*  BUN 16 18  --  16  CREATININE 1.86* 2.00*  --  1.66*  CALCIUM 8.6*  --   --   8.7*  MG  --   --  1.6*  --     Lipid Panel:    Component Value Date/Time   CHOL 148 09/18/2016 1325   TRIG 81 09/18/2016 1325   HDL 40 (L) 09/18/2016 1325   CHOLHDL 3.7 09/18/2016 1325   VLDL 16 09/18/2016 1325   LDLCALC 92 09/18/2016 1325   HgbA1c:  Lab Results  Component Value Date   HGBA1C 8.9 (H) 09/18/2016   Urine Drug Screen:    Component Value Date/Time   LABOPIA NONE DETECTED 09/18/2016 0929   COCAINSCRNUR POSITIVE (A) 09/18/2016 0929   LABBENZ NONE DETECTED 09/18/2016 0929   AMPHETMU NONE DETECTED 09/18/2016 0929   THCU NONE DETECTED 09/18/2016 0929   LABBARB NONE DETECTED 09/18/2016 0929      IMAGING I have personally reviewed the radiological images below and agree with the radiology interpretations.  Dg Chest 2 View 09/18/2016 No edema or consolidation.  Stable cardiac silhouette. Electronically Signed   By: Lowella Grip III M.D.   On: 09/18/2016 09:07   Ct Head Code Stroke W/o Cm 09/18/2016 1. No evidence of acute intracranial abnormality. 2. ASPECTS is 10. 3. Mild chronic small vessel ischemic disease.  Mr Brain Wo Contrast 09/18/2016 1.  No acute intracranial abnormality. 2. Stable MRI appearance the brain since June with mild nonspecific cerebral white matter and right dorsal thalamus signal abnormality. Favor chronic small vessel disease.   Mr Lovenia Kim 09/18/2016 3.  Negative intracranial MRA.   TTE pending  CUS Bilateral: 1-39% ICA stenosis. Vertebral artery flow is antegrade.   PHYSICAL EXAM  Temp:  [97.6 F (36.4 C)-98.2 F (36.8 C)] 97.8 F (36.6 C) (09/29 1231) Pulse Rate:  [67-84] 75 (09/29 1231) Resp:  [16-19] 18 (09/29 0512) BP: (131-168)/(65-100) 148/78 (09/29 1231) SpO2:  [95 %-100 %] 98 % (09/29 1231) Weight:  [194 lb 11.2 oz (88.3 kg)-195 lb 3.2 oz (88.5 kg)] 195 lb 3.2 oz (88.5 kg) (09/29 0512)  General - Well nourished, well developed, in no apparent distress.  Ophthalmologic - Fundi not visualized due to eye  movement.  Cardiovascular - Regular rate and rhythm.  Mental Status -  Level of arousal and orientation to time, place, and person were intact. Language including expression, naming, repetition, comprehension was assessed and found intact. Fund of Knowledge was assessed and was intact.  Cranial Nerves II - XII - II - Visual field intact OU. III, IV, VI - Extraocular movements intact. V - Facial sensation subjectively decreased at left V2 and V3 territory. VII - Facial movement intact bilaterally. VIII - Hearing & vestibular intact bilaterally. X - Palate elevates symmetrically. XI - Chin turning & shoulder shrug intact bilaterally. XII - Tongue protrusion intact.  Motor Strength - The patient's strength was normal in all extremities except RLE 4/5 due to knee and ankle pain and pronator drift was absent.  Bulk was normal and fasciculations were absent.   Motor Tone - Muscle tone was assessed at the neck and appendages and was normal.  Reflexes - The patient's reflexes were 1+ in all extremities and he had no pathological reflexes.  Sensory - Light touch, temperature/pinprick were assessed and were subjectively decreased on the left, 50% of the right.    Coordination - The patient had normal movements in the hands with no ataxia or dysmetria.  Tremor was absent.  Gait and Station - deferred.   ASSESSMENT/PLAN [name] is a 55 y.o. male with history of HTN, type 2 DM with peripheral neuropathy, CKD stage 3, chronic systolic and diastolic heart failure, tobacco abuse, hx IV drug use, cocaine abuse, and hepatitis C presenting with chest pain, L jaw pain, L-sided numbness. He did not receive IV t-PA due to outside of window, minimal deficit.   L side numbness, etiology unclear. May related to chest pain or conversion disorder vs. malingering   MRI  No acute stroke  MRA  negative  Carotid Doppler  unremarkable   2D Echo  pending   LDL 92  HgbA1c 8.9  UDS cocaine  positive  Heparin 5000 units sq tid for VTE prophylaxis  Diet Heart Room service appropriate? Yes; Fluid consistency: Thin  aspirin 325 mg daily prior to admission, now on aspirin 325 mg daily. Continue ASA on discharge.  Patient counseled to be compliant with his antithrombotic medications - has a orange card "nobody wants to do nothing for me", has financial and medication issues - SW consult in place  Ongoing aggressive stroke risk factor management  Therapy recommendations:  pending   Disposition:  pending  (lives in hotel, separated from wife)  Cocaine abuse  UDS positive  Cocaine cessation counseling provided  Pt is willing to quit  Chest  pain  In the setting of cocaine use  Cardiology on board  2D echo pending  Hypertension  Stable  BP goal normotensive  Hyperlipidemia  Home meds:  lipitor 10  lipitor increased to 40  LDL 92, goal < 70  Continue statin at discharge  Diabetes type II with peripheral neuropathy  HgbA1c 8.9, goal < 7.0  Uncontrolled  SSI  On lantus  CBG monitoring  Close follow up with PCP after discharge  Tobacco abuse  Current smoker  Smoking cessation counseling provided  Pt is willing to quit  Other Stroke Risk Factors  ETOH abuse, advised to drink no more than 2 drink(s) a day  Hx heroin abuse, last use 0000000  Chronic systolic and diastolic CHF  Other Active Problems  AKI on CKD stage III  GERD  Hepatitis C  Hypokalemia   normocytic anemia  Hospital day # 0  Neurology will sign off. Please call with questions. No neurology follow up needed. Thanks for the consult.  Rosalin Hawking, MD PhD Stroke Neurology 09/19/2016 2:29 PM     To contact Stroke Continuity provider, please refer to [email or web address] After hours, contact General Neurology

## 2016-09-20 ENCOUNTER — Observation Stay (HOSPITAL_BASED_OUTPATIENT_CLINIC_OR_DEPARTMENT_OTHER): Payer: Medicaid Other

## 2016-09-20 DIAGNOSIS — F1420 Cocaine dependence, uncomplicated: Secondary | ICD-10-CM

## 2016-09-20 DIAGNOSIS — R0789 Other chest pain: Secondary | ICD-10-CM

## 2016-09-20 DIAGNOSIS — I6789 Other cerebrovascular disease: Secondary | ICD-10-CM | POA: Diagnosis not present

## 2016-09-20 DIAGNOSIS — R202 Paresthesia of skin: Secondary | ICD-10-CM

## 2016-09-20 LAB — GLUCOSE, CAPILLARY
Glucose-Capillary: 195 mg/dL — ABNORMAL HIGH (ref 65–99)
Glucose-Capillary: 184 mg/dL — ABNORMAL HIGH (ref 65–99)

## 2016-09-20 LAB — ECHOCARDIOGRAM COMPLETE
Height: 71 in
Weight: 3168 oz

## 2016-09-20 MED ORDER — IBUPROFEN 600 MG PO TABS: 600.0000 mg | ORAL_TABLET | Freq: Three times a day (TID) | ORAL | 0 refills | Status: DC

## 2016-09-20 MED ORDER — ATORVASTATIN CALCIUM 40 MG PO TABS: 40.0000 mg | ORAL_TABLET | Freq: Every day | ORAL | 11 refills | Status: DC

## 2016-09-20 MED ORDER — GLUCOSE BLOOD VI STRP: ORAL_STRIP | 12 refills | Status: DC

## 2016-09-20 MED ORDER — CARVEDILOL 12.5 MG PO TABS: 12.5000 mg | ORAL_TABLET | Freq: Two times a day (BID) | ORAL | 0 refills | Status: DC

## 2016-09-20 MED ORDER — AMLODIPINE BESYLATE 10 MG PO TABS: 5.0000 mg | ORAL_TABLET | Freq: Every day | ORAL | 11 refills | Status: DC

## 2016-09-20 MED ORDER — TRUEPLUS LANCETS 28G MISC: 12 refills | Status: DC

## 2016-09-20 MED ORDER — AMITRIPTYLINE HCL 75 MG PO TABS: 75.0000 mg | ORAL_TABLET | Freq: Every day | ORAL | 11 refills | Status: DC

## 2016-09-20 MED ORDER — AMLODIPINE BESYLATE 10 MG PO TABS
10.0000 mg | ORAL_TABLET | Freq: Every day | ORAL | Status: DC
  Administered 2016-09-20: 10 mg via ORAL
  Filled 2016-09-20: qty 1

## 2016-09-20 MED ORDER — INSULIN GLARGINE 100 UNIT/ML ~~LOC~~ SOLN: 30.0000 [IU] | Freq: Two times a day (BID) | SUBCUTANEOUS | 11 refills | Status: DC

## 2016-09-20 MED ORDER — ASPIRIN EC 81 MG PO TBEC: 325.0000 mg | DELAYED_RELEASE_TABLET | Freq: Once | ORAL | 11 refills | Status: AC

## 2016-09-20 MED ORDER — GABAPENTIN 300 MG PO CAPS: 900.0000 mg | ORAL_CAPSULE | Freq: Two times a day (BID) | ORAL | 3 refills | Status: DC

## 2016-09-20 MED ORDER — INSULIN GLARGINE 100 UNIT/ML ~~LOC~~ SOLN
60.0000 [IU] | Freq: Every day | SUBCUTANEOUS | Status: DC
  Filled 2016-09-20: qty 0.6

## 2016-09-20 MED ORDER — AMLODIPINE BESYLATE 10 MG PO TABS: 10.0000 mg | ORAL_TABLET | Freq: Every day | ORAL | 11 refills | Status: DC

## 2016-09-20 MED ORDER — OMEPRAZOLE 20 MG PO CPDR: 20.0000 mg | DELAYED_RELEASE_CAPSULE | Freq: Every day | ORAL | 3 refills | Status: DC

## 2016-09-20 NOTE — Progress Notes (Signed)
Subjective: Patient was seen and examined this morning. He denies any current chest pain or shortness of breath. He continues to have numbness of the left side of his face at times.   Objective: Vital signs in last 24 hours: Vitals:   09/19/16 2043 09/19/16 2342 09/20/16 0502 09/20/16 0743  BP: (!) 179/90 (!) 172/93 120/79 124/82  Pulse: 66 67 74 71  Resp: 18 18 18 17   Temp: 97.7 F (36.5 C) 97.7 F (36.5 C) 97.8 F (36.6 C) 98 F (36.7 C)  TempSrc: Oral Oral Oral Oral  SpO2: 100% 97% 100% 100%  Weight:   198 lb (89.8 kg)   Height:       Physical Exam General: Vital signs reviewed.  Patient is well-developed and well-nourished, in no acute distress and cooperative with exam.  Cardiovascular: RRR Pulmonary/Chest: Clear to auscultation bilaterally, no wheezes, rales, or rhonchi. Abdominal: Soft, non-tender, non-distended, BS + Extremities: No lower extremity edema bilaterally Skin: Warm, dry and intact.   Assessment/Plan: Principal Problem:   Chest pain Active Problems:   History of drug abuse   Chronic diastolic congestive heart failure (HCC)   Chronic hepatitis C with cirrhosis (HCC)   Essential hypertension   Uncontrolled diabetes mellitus with diabetic nephropathy, with long-term current use of insulin (HCC)   Hypokalemia   CKD (chronic kidney disease) stage 3, GFR 30-59 ml/min   Cocaine abuse   Normocytic anemia   CAD (coronary artery disease), native coronary artery   Homelessness   CVA (cerebral vascular accident) [facility])  Mr. Frayser is a 55yo man with PMHx of HTN, type 2 DM with peripheral neuropathy, CKD stage 3, chronic systolic and diastolic heart failure, tobacco abuse, hx IV drug use, cocaine abuse, and hepatitis C who presents today with chest pain, left jaw pain, and left-sided numbness.  Left Sided Numbness: Etiology is unclear. CT Head, MRA and MRI showed no acute intracranial abnormality. Neurology feels etiology is likely secondary to conversion  disorder versus malingering and not 2/2 a CVA or TIA. Carotid Dopplers showed no significant stenosis. Echo pending. Patient was cocaine positive on admission.  -Aspirin 325 mg QD; will discharge on ASA 81 mg QD -Atorvastatin 40 mg QD -Follow up with PCP  Chest Pain: EKG on admission showed normal sinus rhythm with no T-wave inversions or ST elevations repeat EKG today was unchanged. Troponins were trended 3 and stable at 0.08. Patent's UDS was positive for cocaine. Chest pain likely due to recent cocaine use leading to vasospasm. Patient was seen by Cardiology who recommended a repeat echocardiogram to evaluate LV function. If stable or improved, no further cardiac workup. Patient had a recent coronary angiogram which showed mild nonobstructive coronary disease. -ASA QD -Repeat echo pending -Atorvastatin 40 mg QD -Avoid BB -Follow up outpatient with Cardiology  AKI on CKD Stage 3: At baseline, Cr 1.5.    -Repeat BMET as outpatient  Chronic Systolic and Diastolic CHF: Euvolemic.  - Hold home BP meds  -Repeat echo pending  Type 2 DM with Peripheral Neuropathy: Last A1c 8.9 in August 2017. He takes Lantus 30 units BID and Gabapentin 900 mg BID. Has not taken medications for last 1 week as ran out and cannot afford them.  -Lantus 60 units QD -Moderate ISS -Novolog 3 units TID WC -CBGs with meals and bedtime -Continue Gabapentin 900 mg BID   HTN: BP in 130s-160s. He takes Amlodipine 5 mg daily, Coreg 12.5 mg BID, Clonidine 0.1 mg BID, and Hydralazine 25 mg TID at  home, but has not been taking these medications for at least the past 1 week as he ran out and could not afford them.  -Restart amlodipine 10 mg QD -Avoid BB  Diet: HH CODE: FULL DVT/PE ppx: Lovenox SQ  Dispo: Anticipated discharge in approximately 0 day(s).   LOS: 0 days   Martyn Malay, DO PGY-3 Internal Medicine Resident Pager # [phone] 09/20/2016 10:10 AM

## 2016-09-20 NOTE — Progress Notes (Signed)
CM received call from RN as pt is discharged for med asst.  CM notes pt was MATCHED 08/01/16 and therefore, cannot be MATCHED until 08/02/17. CM notes discharge medications are either resumption or modifications of meds pt pre-hospitalization prescribed and the only new medication is an over the counter ibuprofen.  No other CM needs were communicated.

## 2016-09-20 NOTE — Discharge Instructions (Signed)
Please take all medications as prescribed and please follow up with Dr. Jarold Song on 10/3.

## 2016-09-20 NOTE — Discharge Summary (Signed)
Name: Brad [name] MRN: [id number] DOB: 03-Nov-1961 55 y.o. PCP: Brad Morale, MD  Date of Admission: 09/18/2016  7:55 AM Date of Discharge: 09/20/16 Attending Physician: Dr. Beryle [name]  Discharge Diagnosis:  Principal Problem:   Chest pain Active Problems:   History of drug abuse   Chronic diastolic congestive heart failure (HCC)   Chronic hepatitis C with cirrhosis ([place])   Essential hypertension   Uncontrolled diabetes mellitus with diabetic nephropathy, with long-term current use of insulin (HCC)   Hypokalemia   CKD (chronic kidney disease) stage 3, GFR 30-59 ml/min   Cocaine abuse   Normocytic anemia   CAD (coronary artery disease), native coronary artery   Homelessness   CVA (cerebral vascular accident) ([place])   Discharge Medications:   Medication List    STOP taking these medications   aspirin EC 325 MG tablet   cloNIDine 0.1 MG tablet Commonly known as:  CATAPRES   hydrALAZINE 25 MG tablet Commonly known as:  APRESOLINE   ondansetron 4 MG tablet Commonly known as:  ZOFRAN     TAKE these medications   acetaminophen 325 MG tablet Commonly known as:  TYLENOL Take 2 tablets (650 mg total) by mouth every 6 (six) hours as needed for mild pain (or Fever >/= 101).   amitriptyline 75 MG tablet Commonly known as:  ELAVIL Take 1 tablet (75 mg total) by mouth at bedtime.   amLODipine 10 MG tablet Commonly known as:  NORVASC Take 1 tablet (10 mg total) by mouth daily. What changed:  medication strength  how much to take   atorvastatin 40 MG tablet Commonly known as:  LIPITOR Take 1 tablet (40 mg total) by mouth daily. What changed:  medication strength  how much to take   carvedilol 12.5 MG tablet Commonly known as:  COREG Take 1 tablet (12.5 mg total) by mouth 2 (two) times daily.   gabapentin 300 MG capsule Commonly known as:  NEURONTIN Take 3 capsules (900 mg total) by mouth 2 (two) times daily.   glucose blood test strip Commonly known  as:  TRUE METRIX BLOOD GLUCOSE TEST Use as instructed   ibuprofen 600 MG tablet Commonly known as:  ADVIL,MOTRIN Take 1 tablet (600 mg total) by mouth 3 (three) times daily.   insulin glargine 100 UNIT/ML injection Commonly known as:  LANTUS Inject 0.3 mLs (30 Units total) into the skin 2 (two) times daily.   methocarbamol 750 MG tablet Commonly known as:  ROBAXIN Take 1 tablet (750 mg total) by mouth 2 (two) times daily as needed for muscle spasms.   omeprazole 20 MG capsule Commonly known as:  PRILOSEC Take 1 capsule (20 mg total) by mouth daily.   oxycodone-acetaminophen 2.5-325 MG tablet Commonly known as:  PERCOCET Take 1 tablet by mouth every 4 (four) hours as needed for pain.   senna-docusate 8.6-50 MG tablet Commonly known as:  SENOKOT S Take 1 tablet by mouth at bedtime as needed.   traMADol 50 MG tablet Commonly known as:  ULTRAM Take 50 mg by mouth 2 (two) times daily as needed for moderate pain.   TRUE METRIX METER w/Device Kit 1 each by Does not apply route 3 (three) times daily.   TRUEPLUS LANCETS 28G Misc Use as directed     ASK your doctor about these medications   aspirin EC 81 MG tablet Take 4 tablets (325 mg total) by mouth once. Ask about: Should I take this medication?       Disposition and follow-up:  [name] was discharged from [facility] in Good condition.  At the hospital follow up visit please address:  Left Sided Numbness: Please assess compliance with Aspirin and Atorvastatin.   Chest Pain: Please assess medication compliance, recurrence of chest pain, and if patient followed up with Cardiology.  CKD Stage 3: At baseline, Cr 1.5. Repeat BMET as outpatient  2.  Labs / imaging needed at time of follow-up: BMET  3.  Pending labs/ test needing follow-up: None  Follow-up Appointments: Follow-up [place] Follow up on 09/23/2016.   Why:  Transitional Care  Clinic appointment on 09/23/16 at 3:30 pm with Dr. Jarold [name]. Contact information: [place] [address] [phone]       Brad Casino, MD. Schedule an appointment as soon as possible for a visit today.   Specialty:  Cardiology Why:  for follow up for your heart Contact information: [place] [address] [place] Hospital Course by problem list: Principal Problem:   Chest pain Active Problems:   History of drug abuse   Chronic diastolic congestive heart failure (HCC)   Chronic hepatitis C with cirrhosis ([place])   Essential hypertension   Uncontrolled diabetes mellitus with diabetic nephropathy, with long-term current use of insulin (HCC)   Hypokalemia   CKD (chronic kidney disease) stage 3, GFR 30-59 ml/min   Cocaine abuse   Normocytic anemia   CAD (coronary artery disease), native coronary artery   Homelessness   CVA (cerebral vascular accident) [facility])   [name] is a 55yo man with PMHx of HTN, type 2 DM with peripheral neuropathy, CKD stage 3, chronic systolic and diastolic heart failure, tobacco abuse, hx IV drug use, cocaine abuse, and hepatitis C who presented with chest pain, left jaw pain, and left-sided numbness.  Left Sided Numbness: Etiology is unclear. Patient presented with a 7 hour history of chest pain, left- sided jaw pain, and left-sided numbness in his face, upper extremity, and lower extremity. CT Head, MRA and MRI brain showed no acute intracranial abnormality. Neurology felt etiology is likely secondary to conversion disorder versus malingering and not 2/2 a CVA or TIA. Carotid Dopplers showed no significant stenosis. Echo showed EF of 45-50% and grade 2 diastolic dysfunction. Patient was cocaine positive on admission. Neurology recommended daily Aspirin and Atorvastatin.   Chest Pain: EKG on admission showed normal sinus rhythm with no T-wave inversions or ST elevations repeat  EKG today was unchanged. Troponins were trended 3 and stable at 0.08. Patent's UDS was positive for cocaine. Chest pain likely due to recent cocaine use leadingto vasospasm. Patient was seen by Cardiology who recommended a repeat echocardiogram which showed EF 45-50% and grade 2 diastolic dysfunction. Patient had a recent coronary angiogram which showed mild nonobstructive coronary disease. Patient will follow up with Cardiology as outpatient. Patient was discharged on ASA, Statin and Carvedilol- patient states his cocaine use was a one time event and understands the risk of using a BB in the setting of cocaine use. If repeat use if discovered, would d/c BB.   CKD Stage 3: At baseline, Cr 1.5. Repeat BMET as outpatient  Chronic Systolic and Diastolic CHF: Euvolemic during admission. On amlodipine, atorvastatin, ASA, and carvedilol.   Type 2 DM with Peripheral Neuropathy: Last A1c 8.9 in August 2017. He takes Lantus 30 units BID and Gabapentin 900 mg BID. Has not  taken medications for last 1 week as ran out them. These medications were restarted on discharge.   HTN: BP in 130s-160s. He takes Amlodipine 5 mg daily, Coreg 12.5 mg BID, Clonidine 0.1 mg BID, and Hydralazine 25 mg TID at home, but has not been taking these medications for at least the past 1 week as he ran out and could not afford them. Amlodipine 10 mg daily and Coreg with continued on discharge. Can add back hydralazine and clonidine if needed.  Discharge Vitals:   BP 130/88   Pulse 70   Temp 98.3 F (36.8 C) (Oral)   Resp 16   Ht '5\' 11"'  (1.803 m)   Wt 198 lb (89.8 kg)   SpO2 100%   BMI 27.62 kg/m    Discharge Instructions: Discharge Instructions    Diet - low sodium heart healthy    Complete by:  As directed    Increase activity slowly    Complete by:  As directed       Signed: Martyn Malay, DO PGY-3 Internal Medicine Resident Pager # [phone] 09/21/2016 6:32 PM

## 2016-09-20 NOTE — Progress Notes (Signed)
   Consultation from Dr Debara Pickett reviewed, no new recommendations at this time. Please refer to Dr Lysbeth Penner note for full cardaic evaluation. We will f/u his echo results   Zandra Abts MD         Carlyle Dolly, M.D..Patient ID: Brad [name], male   DOB: [birth year], 55 y.o.   MRN: [id number]

## 2016-09-20 NOTE — Progress Notes (Signed)
  Echocardiogram 2D Echocardiogram has been performed.  Brad [name] M 09/20/2016, 8:53 AM

## 2016-09-20 NOTE — Evaluation (Signed)
Physical Therapy Evaluation Patient Details Name: Brad [name] MRN: [id number] DOB: 13-May-1961 Today's Date: 09/20/2016   History of Present Illness  55 y.o. male admitted to [facility] on 09/18/16 for chest pain.  Mildly elevated troponins, however, stable.  L sided numbness MRI /MRA negative, EKG negative for acute changes, and hypokalemia.  Pt with significant PMHx of DM with peripherial neuropathy, HTN, hepatitis C, h/o drug abuse (+ cocaine this admission), CKD, chronic diastolic CHF, ORIF R ankle fx 07/30/16, and L knee arthroplasty.  Clinical Impression  Pt is likely close to his baseline function, independent gait with some mild balance deficits due to LE peripheral neuropathy.  Pt would benefit from acute PT while he is here, but likely will not need f/u at discharge.  PT plans to provide balance HEP next session.   PT to follow acutely for deficits listed below.      Follow Up Recommendations No PT follow up    Equipment Recommendations  None recommended by PT    Recommendations for Other Services   NA     Precautions / Restrictions Precautions Precautions: Fall Precaution Comments: pt reports h/o falls due to his inability to feel his feet.  Restrictions Weight Bearing Restrictions: No      Mobility  Bed Mobility Overal bed mobility: Modified Independent                Transfers Overall transfer level: Needs assistance Equipment used: None Transfers: Sit to/from Stand Sit to Stand: Modified independent (Device/Increase time)         General transfer comment: relied on hands for transitions, some mild staggering when he got up to his feet  Ambulation/Gait Ambulation/Gait assistance: Supervision Ambulation Distance (Feet): 300 Feet Assistive device: None Gait Pattern/deviations: Step-through pattern;Staggering left;Staggering right;Antalgic   Gait velocity interpretation: at or above normal speed for age/gender General Gait Details: at times mildly  staggering gait pattern, pt generally able to catch himself without external assist, but he does report h/o falls  Stairs Stairs: Yes Stairs assistance: Supervision Stair Management: One rail Right;Step to pattern;Forwards Number of Stairs: 10 General stair comments: verbal cues for less painful technique, up with left down with right due to ankle pain and decreased full ROM.         Balance Overall balance assessment: Needs assistance Sitting-balance support: Feet supported;No upper extremity supported Sitting balance-Leahy Scale: Normal     Standing balance support: No upper extremity supported Standing balance-Leahy Scale: Good Standing balance comment: Has some mild deficits, this is baseline for him.                              Pertinent Vitals/Pain Pain Assessment: 0-10 Pain Score: 5  Pain Location: chest pain and right ankle pain Pain Descriptors / Indicators: Aching;Burning Pain Intervention(s): Limited activity within patient's tolerance;Monitored during session;Repositioned    Home Living Family/patient expects to be discharged to:: Private residence (hotel) Living Arrangements: Alone Available Help at Discharge: Friend(s);Available PRN/intermittently Type of Home: Other(Comment) (hotel) Home Access: Level entry     Home Layout: One level Home Equipment: [place] - 2 wheels;Wheelchair - manual      Prior Function Level of Independence: Independent               Hand Dominance   Dominant Hand: Right    Extremity/Trunk Assessment   Upper Extremity Assessment: Defer to OT evaluation           Lower Extremity  Assessment: RLE deficits/detail RLE Deficits / Details: right ankle ROM to neutral, increased expected post op edema right ankle, very sensative to touch medially (near incsision).  Functional strength good.      Cervical / Trunk Assessment: Normal  Communication   Communication: No difficulties  Cognition Arousal/Alertness:  Awake/alert Behavior During Therapy: WFL for tasks assessed/performed Overall Cognitive Status: Within Functional Limits for tasks assessed                      General Comments General comments (skin integrity, edema, etc.): VSS throughout session, no increase in chest pain with gait, some increase in ankle pain.         Assessment/Plan    PT Assessment Patient needs continued PT services  PT Problem List Decreased strength;Decreased range of motion;Decreased activity tolerance;Decreased balance;Decreased mobility;Decreased knowledge of use of DME;Pain          PT Treatment Interventions DME instruction;Gait training;Stair training;Functional mobility training;Therapeutic activities;Therapeutic exercise;Balance training;Neuromuscular re-education;Patient/family education;Modalities    PT Goals (Current goals can be found in the Care Plan section)  Acute Rehab PT Goals Patient Stated Goal: to decrease pain PT Goal Formulation: With patient Time For Goal Achievement: 10/04/16 Potential to Achieve Goals: Good    Frequency Min 3X/week           End of Session Equipment Utilized During Treatment: Gait belt Activity Tolerance: Patient tolerated treatment well Patient left: in chair;with call bell/phone within reach Nurse Communication: Patient requests pain meds    Functional Assessment Tool Used: assist level Functional Limitation: Mobility: Walking and moving around Mobility: Walking and Moving Around Current Status [id number]): At least 1 percent but less than 20 percent impaired, limited or restricted Mobility: Walking and Moving Around Goal Status [phone]): 0 percent impaired, limited or restricted    Time: 0847-0902 PT Time Calculation (min) (ACUTE ONLY): 15 min   Charges:   PT Evaluation $PT Eval Moderate Complexity: 1 Procedure     PT G Codes:   PT G-Codes **NOT FOR INPATIENT CLASS** Functional Assessment Tool Used: assist level Functional Limitation:  Mobility: Walking and moving around Mobility: Walking and Moving Around Current Status [id number]): At least 1 percent but less than 20 percent impaired, limited or restricted Mobility: Walking and Moving Around Goal Status [phone]): 0 percent impaired, limited or restricted    Cola [place] B. [place], [place], DPT [phone]   09/20/2016, 9:17 AM

## 2016-09-22 ENCOUNTER — Telehealth: Payer: Self-pay

## 2016-09-22 NOTE — Telephone Encounter (Signed)
Transitional Care Clinic Post-discharge Follow-Up Phone Call:   Date of Discharge: 09/20/2016 Principal Discharge Diagnosis(es): chest pain, chronic diastolic congestive heart failiure Post-discharge Communication: (Clearly document all attempts clearly and date contact made) Three  calls placed to the [facility] # [phone] and the phone rang endlessly without an option for leaving a voicemail message. The fourth call was placed and the motel operator answered and transferred the call to the patient's room.  No answer and the voice mailbox was full, unable to leave a message.  Call Completed: No

## 2016-09-22 NOTE — Telephone Encounter (Signed)
Transitional Care Clinic Post-discharge Follow-Up Phone Call:  Date of Discharge: 09/20/2016 Principal Discharge Diagnosis(es): chest pain, history of drug use, chronic diastolic CHF, hep C with cirrhosis,, DM, CKD , CAD Post-discharge Communication: (Clearly document all attempts clearly and date contact made) call placed to the patient at the [facility].  Call Completed: Yes                    With Whom: Patient Interpreter Needed: No     Please check all that apply:  X Patient is knowledgeable of his/her condition(s) and/or treatment. X  Patient is caring for self at home.  - Currently staying at the [facility]. Stated that he does what he has to do to get food, noting that he eats at least once a day.  ? Patient is receiving assist at home from family and/or caregiver. Family and/or caregiver is knowledgeable of patient's condition(s) and/or treatment. ? Patient is receiving home health services. If so, name of agency.     Medication Reconciliation:  ? Medication list reviewed with patient. X  Patient obtained all discharge medications. If not, why? - He said that he doesn't have his medications and will need to get everything when he comes to the clinic tomorrow, He also stated that he doesn't have a glucometer any longer so he has not been able to check his blood sugars.    Activities of Daily Living:  X  Independent - stated that it is hard to get around but he doesn't need an assistive device for ambulation. He did report that he continues to have some right ankle pain.  ? Needs assist (describe; ? home DME used) ? [facility] (describe, ? home DME used)   Community resources in place for patient:  X  None  - he stated that he knows that he needs to apply for food stamps but was not able to get to DSS today.  ? Home Health/Home DME ? Assisted Living ? Support Group           Questions/Concerns discussed: He said that he is " making it." No other questions/concenrns  reported at this time.  He stated that he knows that his appointment at [facility] tomorrow, 09/23/16 is at 1530 and he will need transportation to the clinic.  Informed him that a cab would be arranged to pick him up between 1430-1500. He said that if he needed to be contacted, to call the [facility] # [phone] and ask for Mr Gerald Stabs. The patient said that he would be ready for his appointment.  Informed Jacklynn Lewis, [facility] scheduler of the need for transportation and the time that the patient is expecting to be picked up.

## 2016-09-23 ENCOUNTER — Telehealth: Payer: Self-pay

## 2016-09-23 ENCOUNTER — Ambulatory Visit: Payer: Medicaid Other | Attending: Family Medicine | Admitting: Family Medicine

## 2016-09-23 ENCOUNTER — Encounter: Payer: Self-pay | Admitting: Family Medicine

## 2016-09-23 VITALS — BP 157/99 | HR 83 | Temp 98.0°F | Ht 71.0 in | Wt 197.8 lb

## 2016-09-23 DIAGNOSIS — E0821 Diabetes mellitus due to underlying condition with diabetic nephropathy: Secondary | ICD-10-CM | POA: Diagnosis not present

## 2016-09-23 DIAGNOSIS — E1121 Type 2 diabetes mellitus with diabetic nephropathy: Secondary | ICD-10-CM | POA: Insufficient documentation

## 2016-09-23 DIAGNOSIS — Z967 Presence of other bone and tendon implants: Secondary | ICD-10-CM | POA: Diagnosis not present

## 2016-09-23 DIAGNOSIS — I5032 Chronic diastolic (congestive) heart failure: Secondary | ICD-10-CM | POA: Diagnosis not present

## 2016-09-23 DIAGNOSIS — Z9889 Other specified postprocedural states: Secondary | ICD-10-CM

## 2016-09-23 DIAGNOSIS — E1122 Type 2 diabetes mellitus with diabetic chronic kidney disease: Secondary | ICD-10-CM | POA: Diagnosis not present

## 2016-09-23 DIAGNOSIS — F1410 Cocaine abuse, uncomplicated: Secondary | ICD-10-CM | POA: Diagnosis not present

## 2016-09-23 DIAGNOSIS — E1140 Type 2 diabetes mellitus with diabetic neuropathy, unspecified: Secondary | ICD-10-CM | POA: Insufficient documentation

## 2016-09-23 DIAGNOSIS — I130 Hypertensive heart and chronic kidney disease with heart failure and stage 1 through stage 4 chronic kidney disease, or unspecified chronic kidney disease: Secondary | ICD-10-CM | POA: Insufficient documentation

## 2016-09-23 DIAGNOSIS — E0865 Diabetes mellitus due to underlying condition with hyperglycemia: Secondary | ICD-10-CM | POA: Diagnosis not present

## 2016-09-23 DIAGNOSIS — Z8781 Personal history of (healed) traumatic fracture: Secondary | ICD-10-CM

## 2016-09-23 DIAGNOSIS — Z794 Long term (current) use of insulin: Secondary | ICD-10-CM | POA: Diagnosis not present

## 2016-09-23 DIAGNOSIS — N183 Chronic kidney disease, stage 3 unspecified: Secondary | ICD-10-CM

## 2016-09-23 DIAGNOSIS — I10 Essential (primary) hypertension: Secondary | ICD-10-CM

## 2016-09-23 DIAGNOSIS — E1165 Type 2 diabetes mellitus with hyperglycemia: Secondary | ICD-10-CM | POA: Insufficient documentation

## 2016-09-23 DIAGNOSIS — IMO0002 Reserved for concepts with insufficient information to code with codable children: Secondary | ICD-10-CM

## 2016-09-23 LAB — GLUCOSE, POCT (MANUAL RESULT ENTRY): POC Glucose: 214 mg/dl — AB (ref 70–99)

## 2016-09-23 MED FILL — TRUE METRIX BLOOD GLUCOSE M: W/DEVICE | 1 days supply | Qty: 1 | Fill #0

## 2016-09-23 MED FILL — IBUPROFEN 600 MG TABLET: 600 | 10 days supply | Qty: 30 | Fill #0

## 2016-09-23 MED FILL — $LANTUS 100 UNITS/ML VIAL: 100 | 16 days supply | Qty: 10 | Fill #0 | Status: TO

## 2016-09-23 MED FILL — AMLODIPINE BESYLATE 10 MG T: 10 | 30 days supply | Qty: 30 | Fill #0 | Status: TO

## 2016-09-23 MED FILL — GABAPENTIN 300 MG CAPSULE: 300 | 30 days supply | Qty: 180 | Fill #0 | Status: TO

## 2016-09-23 MED FILL — ATORVASTATIN 40 MG TABLET: 40 | 30 days supply | Qty: 30 | Fill #0 | Status: TO

## 2016-09-23 MED FILL — TRUEplus LANCETS 28G MISC: 25 days supply | Qty: 100 | Fill #1 | Status: TO

## 2016-09-23 MED FILL — TRUE METRIX TEST STRIP: 25 days supply | Qty: 100 | Fill #1 | Status: TO

## 2016-09-23 MED FILL — ?OMEPRAZOLE DR 20 MG CAPSUL: 20 | 30 days supply | Qty: 30 | Fill #0 | Status: TO
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
## 2016-09-23 NOTE — Telephone Encounter (Signed)
Met with the patient today when he was in the clinic for his appointment with Dr Jarold Song.  He spoke about his frustrations with his housing situation - roaches, drugs.  He said that he would consider returning to the half way house,  Encouraged him to contact his probation officer. He stated that he tried to call her ( Ms Leilani Merl - Engineer, manufacturing systems) three times yesterday and left messages but has not heard back. He explained that she may be having difficulties reaching him through the motel main number and the fact that the voicemail is full. He said that she can call the front desk/operator and that person will come to get him.  Strongly urged him to try to call her ( Research officer, trade union ) again today and he stated that he would. He also noted that said that he may have her ( Ms Leilani Merl) contact this CM.  Encouraged him to speak to her about options for housing and he stated that he would.    He stated that he needs to pick up his medications and glucometer at the [facility].  Also encouraged him to go to DSS to apply for food stamps and he said that he would go tomorrow.

## 2016-09-23 NOTE — Progress Notes (Signed)
Medication refills- no meds for three weeks

## 2016-09-24 LAB — VAS US CAROTID
LEFT ECA DIAS: -7 cm/s
LEFT VERTEBRAL DIAS: 9 cm/s
Left CCA dist dias: -15 cm/s
Left CCA dist sys: -63 cm/s
Left CCA prox dias: 15 cm/s
Left CCA prox sys: 85 cm/s
Left ICA dist dias: -26 cm/s
Left ICA dist sys: -96 cm/s
Left ICA prox dias: -19 cm/s
Left ICA prox sys: -65 cm/s
RIGHT ECA DIAS: -10 cm/s
RIGHT VERTEBRAL DIAS: 7 cm/s
Right CCA prox dias: 14 cm/s
Right CCA prox sys: 66 cm/s
Right cca dist sys: -55 cm/s

## 2016-09-24 LAB — MICROALBUMIN / CREATININE URINE RATIO
Creatinine, Urine: 228 mg/dL (ref 20–370)
Microalb Creat Ratio: 733 mcg/mg creat — ABNORMAL HIGH (ref <30)
Microalb, Ur: 167.2 mg/dL

## 2016-09-25 ENCOUNTER — Telehealth: Payer: Self-pay

## 2016-09-25 NOTE — Telephone Encounter (Signed)
-----   Message from Arnoldo Morale, MD sent at 09/24/2016  8:51 AM EDT ----- microalbuminuria due to uncontrolled Diabetes Mellitus

## 2016-09-25 NOTE — Telephone Encounter (Signed)
Writer spoke with patient regarding the microalbuminuria and is aware that this is due to his uncontrolled diabetes.

## 2016-09-25 NOTE — Telephone Encounter (Signed)
-----   Message from Arnoldo Morale, MD sent at 09/24/2016  4:55 PM EDT ----- Normal carotid ultrasound

## 2016-09-25 NOTE — Telephone Encounter (Signed)
This Case Manager received call from patient. Patient indicated he has called his [employer] numerous times but has not received a return call. Patient also indicated he had a card with an alterate contact for a Research officer, trade union. Encouraged patient to try alternate contact.  Patient also indicated he went to DSS to apply for Food Stamps. He indicated his Food Stamp card was ordered but he was told he must talk with Bevely Palmer [phone]) at [place]. Patient indicated he has called Bevely Palmer several times but has not received return call. Patient asked if this Case Manager could try to reach [employer].  Inquired if patient will be able to get a phone since he has been approved for [employer]. Patient indicated when he receives his Food Stamps card he will be able to get a phone.  Placed call to Bevely Palmer who indicated she just spoke with patient and patient must come in for an appointment with her. She indicated patient agreeable to appointment. Call placed to patient at [facility] to provide update. Was told to call back at a later time.  Addendum-Attempted to call [facility] back to speak with patient. No answer.

## 2016-09-25 NOTE — Telephone Encounter (Signed)
Patient notified about normal carotid US and stated understanding per Dr. Jarold Song.

## 2016-09-26 ENCOUNTER — Telehealth: Payer: Self-pay

## 2016-09-26 NOTE — Telephone Encounter (Signed)
This Case Manager placed call to patient to determine if he spoke with DSS Case Worker, [employer]. Patient indicated he spoke with Bevely Palmer and has an appointment with her on 09/29/16 at 1500.  Patient also indicated he spoke with his Engineer, manufacturing systems and was told an Garment/textile technologist would come to see him today. Patient planning to discuss housing situation, options with Engineer, manufacturing systems. No additional needs identified.

## 2016-09-30 ENCOUNTER — Telehealth: Payer: Self-pay

## 2016-09-30 NOTE — Telephone Encounter (Signed)
Attempted to contact the patient to inquire about the outcome of his meeting with his DSS caseworker and with his probation officer. Call placed to the [facility] #  [phone] and spoke to Tubby who stated that the patient was not around right now. He had seen him earlier. A HIPAA compliant message was left requesting that the patient call this CM at [facility].

## 2016-09-30 NOTE — Telephone Encounter (Signed)
Call received from the patient and he confirmed that he met with his DSS caseworker and has been approved for food stamps - $194/month.  He said that he has the card and is waiting for the funds to be loaded.   He stated that he has not seen his probation officer yet. He noted that he was supposed to see her today because she wasn't feeling well and was leaving the office and he didn't see her on Friday, 10/6/;17 because he didn't have the money for the bus but he will try to see her tomorrow.

## 2016-10-06 ENCOUNTER — Observation Stay (HOSPITAL_COMMUNITY)
Admission: AD | Admit: 2016-10-06 | Discharge: 2016-10-08 | Disposition: A | Discharge status: Home / Self Care | Payer: Medicaid Other | Source: Intra-hospital | Attending: Psychiatry | Admitting: Psychiatry

## 2016-10-06 ENCOUNTER — Encounter (HOSPITAL_COMMUNITY): Payer: Self-pay | Admitting: *Deleted

## 2016-10-06 ENCOUNTER — Encounter (HOSPITAL_COMMUNITY): Payer: Self-pay | Admitting: Emergency Medicine

## 2016-10-06 ENCOUNTER — Emergency Department (HOSPITAL_COMMUNITY): Payer: Medicaid Other

## 2016-10-06 ENCOUNTER — Emergency Department (HOSPITAL_COMMUNITY)
Admission: EM | Admit: 2016-10-06 | Discharge: 2016-10-06 | Disposition: A | Discharge status: Other Acute Inpatient Hospital | Payer: Medicaid Other | Attending: Emergency Medicine | Admitting: Emergency Medicine

## 2016-10-06 DIAGNOSIS — Y929 Unspecified place or not applicable: Secondary | ICD-10-CM | POA: Insufficient documentation

## 2016-10-06 DIAGNOSIS — Z96652 Presence of left artificial knee joint: Secondary | ICD-10-CM | POA: Insufficient documentation

## 2016-10-06 DIAGNOSIS — S0993XA Unspecified injury of face, initial encounter: Secondary | ICD-10-CM | POA: Diagnosis present

## 2016-10-06 DIAGNOSIS — I2510 Atherosclerotic heart disease of native coronary artery without angina pectoris: Secondary | ICD-10-CM | POA: Diagnosis not present

## 2016-10-06 DIAGNOSIS — Z8546 Personal history of malignant neoplasm of prostate: Secondary | ICD-10-CM | POA: Insufficient documentation

## 2016-10-06 DIAGNOSIS — F329 Major depressive disorder, single episode, unspecified: Secondary | ICD-10-CM | POA: Diagnosis not present

## 2016-10-06 DIAGNOSIS — I5032 Chronic diastolic (congestive) heart failure: Secondary | ICD-10-CM | POA: Insufficient documentation

## 2016-10-06 DIAGNOSIS — Z808 Family history of malignant neoplasm of other organs or systems: Secondary | ICD-10-CM

## 2016-10-06 DIAGNOSIS — E1140 Type 2 diabetes mellitus with diabetic neuropathy, unspecified: Secondary | ICD-10-CM | POA: Insufficient documentation

## 2016-10-06 DIAGNOSIS — Y939 Activity, unspecified: Secondary | ICD-10-CM | POA: Insufficient documentation

## 2016-10-06 DIAGNOSIS — H538 Other visual disturbances: Secondary | ICD-10-CM

## 2016-10-06 DIAGNOSIS — K7460 Unspecified cirrhosis of liver: Secondary | ICD-10-CM | POA: Insufficient documentation

## 2016-10-06 DIAGNOSIS — I130 Hypertensive heart and chronic kidney disease with heart failure and stage 1 through stage 4 chronic kidney disease, or unspecified chronic kidney disease: Secondary | ICD-10-CM | POA: Insufficient documentation

## 2016-10-06 DIAGNOSIS — E1122 Type 2 diabetes mellitus with diabetic chronic kidney disease: Secondary | ICD-10-CM | POA: Insufficient documentation

## 2016-10-06 DIAGNOSIS — F1994 Other psychoactive substance use, unspecified with psychoactive substance-induced mood disorder: Secondary | ICD-10-CM | POA: Diagnosis present

## 2016-10-06 DIAGNOSIS — E1121 Type 2 diabetes mellitus with diabetic nephropathy: Secondary | ICD-10-CM | POA: Insufficient documentation

## 2016-10-06 DIAGNOSIS — R51 Headache: Secondary | ICD-10-CM | POA: Diagnosis not present

## 2016-10-06 DIAGNOSIS — N183 Chronic kidney disease, stage 3 (moderate): Secondary | ICD-10-CM | POA: Insufficient documentation

## 2016-10-06 DIAGNOSIS — S0083XA Contusion of other part of head, initial encounter: Secondary | ICD-10-CM | POA: Insufficient documentation

## 2016-10-06 DIAGNOSIS — F17210 Nicotine dependence, cigarettes, uncomplicated: Secondary | ICD-10-CM | POA: Insufficient documentation

## 2016-10-06 DIAGNOSIS — B182 Chronic viral hepatitis C: Secondary | ICD-10-CM | POA: Diagnosis not present

## 2016-10-06 DIAGNOSIS — Z794 Long term (current) use of insulin: Secondary | ICD-10-CM | POA: Diagnosis not present

## 2016-10-06 DIAGNOSIS — W19XXXA Unspecified fall, initial encounter: Secondary | ICD-10-CM | POA: Diagnosis not present

## 2016-10-06 DIAGNOSIS — Z833 Family history of diabetes mellitus: Secondary | ICD-10-CM | POA: Diagnosis not present

## 2016-10-06 DIAGNOSIS — Y999 Unspecified external cause status: Secondary | ICD-10-CM | POA: Insufficient documentation

## 2016-10-06 DIAGNOSIS — R45851 Suicidal ideations: Secondary | ICD-10-CM | POA: Diagnosis not present

## 2016-10-06 DIAGNOSIS — M1990 Unspecified osteoarthritis, unspecified site: Secondary | ICD-10-CM | POA: Diagnosis not present

## 2016-10-06 DIAGNOSIS — Z79899 Other long term (current) drug therapy: Secondary | ICD-10-CM | POA: Diagnosis not present

## 2016-10-06 DIAGNOSIS — K219 Gastro-esophageal reflux disease without esophagitis: Secondary | ICD-10-CM | POA: Insufficient documentation

## 2016-10-06 DIAGNOSIS — Z8249 Family history of ischemic heart disease and other diseases of the circulatory system: Secondary | ICD-10-CM | POA: Diagnosis not present

## 2016-10-06 LAB — BASIC METABOLIC PANEL
Anion gap: 13 (ref 5–15)
BUN: 15 mg/dL (ref 6–20)
CO2: 23 mmol/L (ref 22–32)
Calcium: 8.2 mg/dL — ABNORMAL LOW (ref 8.9–10.3)
Chloride: 99 mmol/L — ABNORMAL LOW (ref 101–111)
Creatinine, Ser: 1.52 mg/dL — ABNORMAL HIGH (ref 0.61–1.24)
GFR calc Af Amer: 57 mL/min — ABNORMAL LOW (ref >60)
GFR calc non Af Amer: 50 mL/min — ABNORMAL LOW (ref >60)
Glucose, Bld: 123 mg/dL — ABNORMAL HIGH (ref 65–99)
Potassium: 3 mmol/L — ABNORMAL LOW (ref 3.5–5.1)
Sodium: 135 mmol/L (ref 135–145)

## 2016-10-06 LAB — HEPATIC FUNCTION PANEL
ALT: 73 U/L — ABNORMAL HIGH (ref 17–63)
AST: 122 U/L — ABNORMAL HIGH (ref 15–41)
Albumin: 3.7 g/dL (ref 3.5–5.0)
Alkaline Phosphatase: 127 U/L — ABNORMAL HIGH (ref 38–126)
Bilirubin, Direct: 0.2 mg/dL (ref 0.1–0.5)
Indirect Bilirubin: 0.7 mg/dL (ref 0.3–0.9)
Total Bilirubin: 0.9 mg/dL (ref 0.3–1.2)
Total Protein: 8.3 g/dL — ABNORMAL HIGH (ref 6.5–8.1)

## 2016-10-06 LAB — CBC WITH DIFFERENTIAL/PLATELET
Basophils Absolute: 0 10*3/uL (ref 0.0–0.1)
Basophils Relative: 1 %
Eosinophils Absolute: 0.1 10*3/uL (ref 0.0–0.7)
Eosinophils Relative: 2 %
HCT: 39.1 % (ref 39.0–52.0)
Hemoglobin: 13.9 g/dL (ref 13.0–17.0)
Lymphocytes Relative: 45 %
Lymphs Abs: 3 10*3/uL (ref 0.7–4.0)
MCH: 32.6 pg (ref 26.0–34.0)
MCHC: 35.5 g/dL (ref 30.0–36.0)
MCV: 91.6 fL (ref 78.0–100.0)
Monocytes Absolute: 0.6 10*3/uL (ref 0.1–1.0)
Monocytes Relative: 9 %
Neutro Abs: 2.9 10*3/uL (ref 1.7–7.7)
Neutrophils Relative %: 43 %
Platelets: 180 10*3/uL (ref 150–400)
RBC: 4.27 MIL/uL (ref 4.22–5.81)
RDW: 12.7 % (ref 11.5–15.5)
WBC: 6.6 10*3/uL (ref 4.0–10.5)

## 2016-10-06 LAB — RAPID URINE DRUG SCREEN, HOSP PERFORMED
Amphetamines: NOT DETECTED
Barbiturates: NOT DETECTED
Benzodiazepines: NOT DETECTED
Cocaine: POSITIVE — AB
Opiates: NOT DETECTED
Tetrahydrocannabinol: NOT DETECTED

## 2016-10-06 LAB — ETHANOL: Alcohol, Ethyl (B): 65 mg/dL — ABNORMAL HIGH (ref <5)

## 2016-10-06 LAB — GLUCOSE, CAPILLARY
Glucose-Capillary: 224 mg/dL — ABNORMAL HIGH (ref 65–99)
Glucose-Capillary: 302 mg/dL — ABNORMAL HIGH (ref 65–99)
Glucose-Capillary: 72 mg/dL (ref 65–99)

## 2016-10-06 LAB — SALICYLATE LEVEL: Salicylate Lvl: <7 mg/dL (ref 2.8–30.0)

## 2016-10-06 LAB — ACETAMINOPHEN LEVEL: Acetaminophen (Tylenol), Serum: <10 ug/mL — ABNORMAL LOW (ref 10–30)

## 2016-10-06 MED ORDER — LORAZEPAM 1 MG PO TABS: 0.0000 mg | ORAL_TABLET | Freq: Two times a day (BID) | ORAL | Status: DC

## 2016-10-06 MED ORDER — FLUORESCEIN SODIUM 1 MG OP STRP
1.0000 | ORAL_STRIP | Freq: Once | OPHTHALMIC | Status: AC
  Administered 2016-10-06: 1 via OPHTHALMIC
  Filled 2016-10-06: qty 1

## 2016-10-06 MED ORDER — IBUPROFEN 800 MG PO TABS
800.0000 mg | ORAL_TABLET | Freq: Three times a day (TID) | ORAL | Status: DC | PRN
  Administered 2016-10-06: 800 mg via ORAL
  Filled 2016-10-06: qty 1

## 2016-10-06 MED ORDER — LOPERAMIDE HCL 2 MG PO CAPS: 2.0000 mg | ORAL_CAPSULE | ORAL | Status: DC | PRN

## 2016-10-06 MED ORDER — OXYCODONE HCL 5 MG PO TABS
5.0000 mg | ORAL_TABLET | Freq: Once | ORAL | Status: AC
  Administered 2016-10-06: 5 mg via ORAL
  Filled 2016-10-06: qty 1

## 2016-10-06 MED ORDER — TRAZODONE HCL 50 MG PO TABS
50.0000 mg | ORAL_TABLET | Freq: Every evening | ORAL | Status: DC | PRN
  Administered 2016-10-06 – 2016-10-07 (×2): 50 mg via ORAL
  Filled 2016-10-06 (×2): qty 1

## 2016-10-06 MED ORDER — THIAMINE HCL 100 MG/ML IJ SOLN
100.0000 mg | Freq: Once | INTRAMUSCULAR | Status: AC
  Administered 2016-10-06: 100 mg via INTRAMUSCULAR
  Filled 2016-10-06: qty 2

## 2016-10-06 MED ORDER — LORAZEPAM 1 MG PO TABS
1.0000 mg | ORAL_TABLET | Freq: Three times a day (TID) | ORAL | Status: AC
  Administered 2016-10-07 (×3): 1 mg via ORAL
  Filled 2016-10-06 (×3): qty 1

## 2016-10-06 MED ORDER — ACETAMINOPHEN 325 MG PO TABS: 650.0000 mg | ORAL_TABLET | Freq: Four times a day (QID) | ORAL | Status: DC | PRN

## 2016-10-06 MED ORDER — ONDANSETRON HCL 4 MG PO TABS: 4.0000 mg | ORAL_TABLET | Freq: Three times a day (TID) | ORAL | Status: DC | PRN

## 2016-10-06 MED ORDER — HYDROMORPHONE HCL 1 MG/ML IJ SOLN: 0.2500 mg | INTRAMUSCULAR | Status: DC | PRN

## 2016-10-06 MED ORDER — SERTRALINE HCL 25 MG PO TABS
25.0000 mg | ORAL_TABLET | Freq: Every day | ORAL | Status: DC
  Administered 2016-10-06 – 2016-10-08 (×3): 25 mg via ORAL
  Filled 2016-10-06 (×3): qty 1

## 2016-10-06 MED ORDER — LORAZEPAM 1 MG PO TABS
1.0000 mg | ORAL_TABLET | Freq: Four times a day (QID) | ORAL | Status: AC
  Administered 2016-10-06 (×3): 1 mg via ORAL
  Filled 2016-10-06 (×3): qty 1

## 2016-10-06 MED ORDER — LORAZEPAM 1 MG PO TABS: 0.0000 mg | ORAL_TABLET | Freq: Four times a day (QID) | ORAL | Status: DC

## 2016-10-06 MED ORDER — NICOTINE 21 MG/24HR TD PT24: 21.0000 mg | MEDICATED_PATCH | Freq: Every day | TRANSDERMAL | Status: DC

## 2016-10-06 MED ORDER — HYDROXYZINE HCL 25 MG PO TABS
25.0000 mg | ORAL_TABLET | Freq: Four times a day (QID) | ORAL | Status: DC | PRN
  Administered 2016-10-06 – 2016-10-07 (×2): 25 mg via ORAL
  Filled 2016-10-06 (×2): qty 1

## 2016-10-06 MED ORDER — INSULIN GLARGINE 100 UNIT/ML ~~LOC~~ SOLN: 10.0000 [IU] | Freq: Every day | SUBCUTANEOUS | Status: DC

## 2016-10-06 MED ORDER — LORAZEPAM 1 MG PO TABS
1.0000 mg | ORAL_TABLET | Freq: Every day | ORAL | Status: DC
Start: 2016-10-09 — End: 2016-10-08

## 2016-10-06 MED ORDER — ALUM & MAG HYDROXIDE-SIMETH 200-200-20 MG/5ML PO SUSP: 30.0000 mL | ORAL | Status: DC | PRN

## 2016-10-06 MED ORDER — INSULIN ASPART 100 UNIT/ML ~~LOC~~ SOLN
0.0000 [IU] | Freq: Three times a day (TID) | SUBCUTANEOUS | Status: DC
  Administered 2016-10-06: 11 [IU] via SUBCUTANEOUS
  Administered 2016-10-07: 5 [IU] via SUBCUTANEOUS
  Administered 2016-10-07: 3 [IU] via SUBCUTANEOUS
  Administered 2016-10-07: 11 [IU] via SUBCUTANEOUS
  Administered 2016-10-08: 5 [IU] via SUBCUTANEOUS
  Administered 2016-10-08: 11 [IU] via SUBCUTANEOUS

## 2016-10-06 MED ORDER — POTASSIUM CHLORIDE CRYS ER 20 MEQ PO TBCR
80.0000 meq | EXTENDED_RELEASE_TABLET | Freq: Once | ORAL | Status: AC
  Administered 2016-10-06: 80 meq via ORAL
  Filled 2016-10-06: qty 4

## 2016-10-06 MED ORDER — VITAMIN B-1 100 MG PO TABS
100.0000 mg | ORAL_TABLET | Freq: Every day | ORAL | Status: DC
  Administered 2016-10-07 – 2016-10-08 (×2): 100 mg via ORAL
  Filled 2016-10-06 (×2): qty 1

## 2016-10-06 MED ORDER — GABAPENTIN 400 MG PO CAPS
400.0000 mg | ORAL_CAPSULE | Freq: Two times a day (BID) | ORAL | Status: DC
  Administered 2016-10-06 – 2016-10-08 (×4): 400 mg via ORAL
  Filled 2016-10-06 (×4): qty 1

## 2016-10-06 MED ORDER — MAGNESIUM HYDROXIDE 400 MG/5ML PO SUSP
30.0000 mL | Freq: Every day | ORAL | Status: DC | PRN
Start: 2016-10-06 — End: 2016-10-08

## 2016-10-06 MED ORDER — ONDANSETRON 4 MG PO TBDP
8.0000 mg | ORAL_TABLET | Freq: Once | ORAL | Status: AC
  Administered 2016-10-06: 8 mg via ORAL
  Filled 2016-10-06: qty 2

## 2016-10-06 MED ORDER — ADULT MULTIVITAMIN W/MINERALS CH
1.0000 | ORAL_TABLET | Freq: Every day | ORAL | Status: DC
  Administered 2016-10-06 – 2016-10-08 (×3): 1 via ORAL
  Filled 2016-10-06 (×3): qty 1

## 2016-10-06 MED ORDER — TETRACAINE HCL 0.5 % OP SOLN
2.0000 [drp] | Freq: Once | OPHTHALMIC | Status: AC
  Administered 2016-10-06: 2 [drp] via OPHTHALMIC
  Filled 2016-10-06: qty 2

## 2016-10-06 MED ORDER — IBUPROFEN 400 MG PO TABS
600.0000 mg | ORAL_TABLET | Freq: Three times a day (TID) | ORAL | Status: DC | PRN
  Administered 2016-10-06: 600 mg via ORAL
  Filled 2016-10-06: qty 1

## 2016-10-06 MED ORDER — LORAZEPAM 1 MG PO TABS
1.0000 mg | ORAL_TABLET | Freq: Two times a day (BID) | ORAL | Status: DC
  Administered 2016-10-08: 1 mg via ORAL
  Filled 2016-10-06: qty 1

## 2016-10-06 MED ORDER — ONDANSETRON 4 MG PO TBDP: 4.0000 mg | ORAL_TABLET | Freq: Four times a day (QID) | ORAL | Status: DC | PRN

## 2016-10-06 NOTE — ED Notes (Signed)
Patient sitting on stretcher watching television

## 2016-10-06 NOTE — ED Notes (Signed)
Security wanding patient at this time

## 2016-10-06 NOTE — ED Notes (Signed)
Patient attempting to use urinal at this time.

## 2016-10-06 NOTE — ED Notes (Signed)
Telepsych machine placed in room for patient to talk to a [facility] counselor

## 2016-10-06 NOTE — ED Notes (Signed)
Patient is leaving with [facility]. A facesheet, EMTALA, MAR, and consent for transport and admission to [facility] were sent with [facility]. Pt. Belongings given to the [facility] transporter. Pt.

## 2016-10-06 NOTE — Progress Notes (Signed)
Pt in bed watching TV.  Pt has large bruise under L eye.  Pt sts he tripped over a tree root and fell.  Pt denies pain or discomfort.  Pt BS at 1950 was 302.  Pt given 11 units Novolog.  Pt denies any self harm thoughts at this time. Pt is on probation with care ending by [employer] last month.  While on unit, pt received news that his application for Medicare has been approved.  Pt sts he suffers from depression and substance abuse.  Pt denies any withdrawal symptoms.  Pt seeking help to find an out patient program.  Pt give snacks and meds on schedule.  Pt continuously observed for safety while on unit except when in the bathroom. Pt remains safe.

## 2016-10-06 NOTE — ED Notes (Signed)
[facility] transportation has arrived to transport patient to [facility]

## 2016-10-06 NOTE — Hospital Discharge Follow-Up (Signed)
The patient is known to [facility]. His appointment for 10/07/16 has been cancelled and can be rescheduled when the discharge date is determined.

## 2016-10-06 NOTE — Progress Notes (Signed)
Patient stated that he could not go back to where he came from, so this Probation officer handed the patient resources for area shelters in [place]. Patient believes that medication management will help with his depression. Patient is hoping the medications that he is given at [facility] will help him cope with his depression.

## 2016-10-06 NOTE — ED Notes (Signed)
Lunch tray ordered, regular diet, no sharps

## 2016-10-06 NOTE — ED Triage Notes (Signed)
Pt brought to ED by [facility] from home after having a fall 3 hours ago, pt refuses to come to ED before and now is c/o blurred vision, nausea, vomiting and dizziness, pt denies LOC, states he lost his balance, tripped and fell, small lac present on his left side eye.

## 2016-10-06 NOTE — ED Notes (Signed)
Patient asking Cloyde Reams, RN for some food; Molly, RN offers a happy meal which consists of a [place] sandwich, applesauce and a drink of his choice; patient stated he doesn't want that he would like a meal from the cafeteria; Cloyde Reams, RN informed patient that she can call and place the order but it may take up to 45 minutes or longer for him to receive the meal tray; patient acknowledges and understands and is willing to wait for the meal

## 2016-10-06 NOTE — ED Provider Notes (Signed)
9:47 AM Pt signed out to me at shift change. Pt with multiple complaints. initically came in for a fall. At discharge pt asked for detox and when outpatient resources given pt stated he was suicidal. TTS consult ordered.    Patient seen by TTS. They would like to admit patient for observation. Patient at this time is medically cleared. He is hypokalemic, total of 80 mEq of potassium given in emergency department. Renal function is at baseline. He is awake and alert. He is complaining of pain to the right ankle which is chronic and left face from where he hit his head from his fall. Will order Tylenol or Motrin. Will place holding orders. Patient admitted to me as well that he was thinking about hurting himself if he was to be discharged today. He denied any plan to me. He stated "I'm just going through a lot."  Medically cleared   Jeannett Senior, PA-C 10/06/16 [place], MD 10/06/16 2215

## 2016-10-06 NOTE — ED Notes (Signed)
Patient laying on stretcher, watching television

## 2016-10-06 NOTE — ED Notes (Signed)
[facility] contacted to transport patient to [facility]
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
## 2016-10-06 NOTE — ED Notes (Addendum)
Pt has been wanded by security, changed into burgundy scrubs, belongings bagged up and taken out of room, and the room has been secured with zip ties.

## 2016-10-06 NOTE — ED Notes (Signed)
Sitting with patient until house sitter arrives

## 2016-10-06 NOTE — BH Assessment (Signed)
[place] Assessment Progress Note Case was staffed with Rosana Hoes NP who recommended patient to Observation Unit.

## 2016-10-06 NOTE — BH Assessment (Signed)
[place] Assessment Progress Note   Case was staffed with Rosana Hoes NP who recommended patient to Observation Unit.

## 2016-10-06 NOTE — ED Notes (Signed)
Patient sitting on the side of the stretcher; watching television; no needs at this time

## 2016-10-06 NOTE — ED Notes (Signed)
Patient transported to X-ray 
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
## 2016-10-06 NOTE — ED Notes (Signed)
Patient resting on stretcher at this time; warm blanket given

## 2016-10-06 NOTE — ED Notes (Signed)
Pharmacy tech in speaking to patient about his medicines

## 2016-10-06 NOTE — ED Notes (Signed)
Lahoma Rocker, PA speaking to patient

## 2016-10-06 NOTE — ED Notes (Signed)
Patient is finished with talking to psychiatrist via telepsych machine

## 2016-10-06 NOTE — ED Notes (Signed)
Patient up ambulatory to the bathroom at this time 

## 2016-10-06 NOTE — ED Notes (Signed)
Patient transported to CT 

## 2016-10-06 NOTE — ED Notes (Signed)
Patient up using the urinal at this time

## 2016-10-06 NOTE — ED Notes (Signed)
Patient still laying on stretcher resting; no needs at this time
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
## 2016-10-06 NOTE — ED Notes (Signed)
Pt asking RN about detox programs within hospital. This RN and Abigail Butts, PA went to beside to talk to pt about detox program. PA informed pt that there are no inpatient detox programs and that we could supply him with outpatient programs. Pt stated "if I don't get help now, then I might do something stupid and I don't want to [facility]." PA asked pt "what does doing something stupid mean?" Pt stated "I am not sure, but if I leave here I will do something stupid." PA asked pt "are you suicidial right now?" Pt stated " sometimes I think about it." Pt has no plan to hurt himself at this time. PA to consult TTS.

## 2016-10-06 NOTE — ED Notes (Signed)
Whitney, RN handed patient burgundy scrubs to change into; patient is doing this at this time

## 2016-10-07 ENCOUNTER — Ambulatory Visit: Payer: Self-pay | Admitting: Family Medicine

## 2016-10-07 DIAGNOSIS — Z833 Family history of diabetes mellitus: Secondary | ICD-10-CM | POA: Diagnosis not present

## 2016-10-07 DIAGNOSIS — F1994 Other psychoactive substance use, unspecified with psychoactive substance-induced mood disorder: Secondary | ICD-10-CM | POA: Diagnosis not present

## 2016-10-07 DIAGNOSIS — Z808 Family history of malignant neoplasm of other organs or systems: Secondary | ICD-10-CM | POA: Diagnosis not present

## 2016-10-07 DIAGNOSIS — F329 Major depressive disorder, single episode, unspecified: Secondary | ICD-10-CM | POA: Diagnosis not present

## 2016-10-07 DIAGNOSIS — Z8249 Family history of ischemic heart disease and other diseases of the circulatory system: Secondary | ICD-10-CM | POA: Diagnosis not present

## 2016-10-07 LAB — GLUCOSE, CAPILLARY
Glucose-Capillary: 172 mg/dL — ABNORMAL HIGH (ref 65–99)
Glucose-Capillary: 240 mg/dL — ABNORMAL HIGH (ref 65–99)
Glucose-Capillary: 304 mg/dL — ABNORMAL HIGH (ref 65–99)
Glucose-Capillary: 240 mg/dL — ABNORMAL HIGH (ref 65–99)

## 2016-10-07 LAB — BASIC METABOLIC PANEL
Anion gap: 8 (ref 5–15)
BUN: 20 mg/dL (ref 6–20)
CO2: 26 mmol/L (ref 22–32)
Calcium: 8.4 mg/dL — ABNORMAL LOW (ref 8.9–10.3)
Chloride: 103 mmol/L (ref 101–111)
Creatinine, Ser: 1.61 mg/dL — ABNORMAL HIGH (ref 0.61–1.24)
GFR calc Af Amer: 54 mL/min — ABNORMAL LOW (ref >60)
GFR calc non Af Amer: 46 mL/min — ABNORMAL LOW (ref >60)
Glucose, Bld: 268 mg/dL — ABNORMAL HIGH (ref 65–99)
Potassium: 3.8 mmol/L (ref 3.5–5.1)
Sodium: 137 mmol/L (ref 135–145)

## 2016-10-07 MED ORDER — INSULIN GLARGINE 100 UNIT/ML ~~LOC~~ SOLN
15.0000 [IU] | Freq: Every day | SUBCUTANEOUS | Status: DC
  Administered 2016-10-07: 15 [IU] via SUBCUTANEOUS

## 2016-10-07 NOTE — Progress Notes (Signed)
This Probation officer made a follow up call to [facility] this evening at 21:00. Lattie Haw (RN) answered the phone and stated that patient was declined because he needed a higher level of care.  Patient was also notified that he has started receiving Medicaid. This Probation officer called [facility] but their business hours are M-Th 9am-5pm. This Probation officer will report this information to oncoming shift.

## 2016-10-07 NOTE — Progress Notes (Signed)
[facility] contacted.  Pt denied admission.  [facility] advised pt needs higher level of care.

## 2016-10-07 NOTE — [facility] Counselor (Signed)
This Probation officer spoke with pt in regards to discharge planning.Pt verbalizes that he is homeless and has no where to go. This Probation officer made suggestion to this pt in regards to [facility]. This Probation officer faxed supporting documentation to Herbie Baltimore at [facility] for pt review.

## 2016-10-07 NOTE — Progress Notes (Signed)
Pt in bed and quiet.  Pt has flat affect and is not very talkative.  Pt denies pain at this time.  Pt has r ankle swelling and stats that he has a triple fx of the ankle and it has pins.  Pt will perk up when talking about football and states he had scholarship to [employer] in the lat 70's but was injured.  Pt sts he is due to go to [facility] tomorrow.  Pt asks for snacks.   Pt given snacks.  Pt continuously observed for safety while on the unit except when in the bathroom.  Pt offered support and encouragement. Pt remains safe and comfortable on unit.

## 2016-10-07 NOTE — Progress Notes (Signed)
D: Patient awake and alert; affect flat; mood depressed; he denies suicidal ideation, stating "not at the moment.:  He also denies homicidal ideation and AVH;  No self-injurious behaviors noted or reported A:  Medications given as scheduled; emotional support provided; encouraged him to seek assistance with needs/concerns R:  Emotional support provided.
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
## 2016-10-08 DIAGNOSIS — F17210 Nicotine dependence, cigarettes, uncomplicated: Secondary | ICD-10-CM | POA: Diagnosis not present

## 2016-10-08 DIAGNOSIS — Z803 Family history of malignant neoplasm of breast: Secondary | ICD-10-CM

## 2016-10-08 DIAGNOSIS — Z79899 Other long term (current) drug therapy: Secondary | ICD-10-CM | POA: Diagnosis not present

## 2016-10-08 DIAGNOSIS — Z833 Family history of diabetes mellitus: Secondary | ICD-10-CM | POA: Diagnosis not present

## 2016-10-08 DIAGNOSIS — F329 Major depressive disorder, single episode, unspecified: Secondary | ICD-10-CM | POA: Diagnosis not present

## 2016-10-08 DIAGNOSIS — Z8249 Family history of ischemic heart disease and other diseases of the circulatory system: Secondary | ICD-10-CM

## 2016-10-08 DIAGNOSIS — F1994 Other psychoactive substance use, unspecified with psychoactive substance-induced mood disorder: Secondary | ICD-10-CM | POA: Diagnosis not present

## 2016-10-08 LAB — GLUCOSE, CAPILLARY
Glucose-Capillary: 224 mg/dL — ABNORMAL HIGH (ref 65–99)
Glucose-Capillary: 322 mg/dL — ABNORMAL HIGH (ref 65–99)

## 2016-10-08 MED ORDER — TRAZODONE HCL 50 MG PO TABS: 50.0000 mg | ORAL_TABLET | Freq: Every evening | ORAL | 0 refills | Status: DC | PRN

## 2016-10-08 MED ORDER — NICOTINE 21 MG/24HR TD PT24: 21.0000 mg | MEDICATED_PATCH | Freq: Every day | TRANSDERMAL | 0 refills | Status: DC

## 2016-10-08 MED ORDER — SERTRALINE HCL 25 MG PO TABS: 25.0000 mg | ORAL_TABLET | Freq: Every day | ORAL | 0 refills | Status: DC

## 2016-10-08 MED ORDER — GABAPENTIN 300 MG PO CAPS
900.0000 mg | ORAL_CAPSULE | Freq: Once | ORAL | Status: AC
  Administered 2016-10-08: 900 mg via ORAL
  Filled 2016-10-08: qty 3

## 2016-10-08 NOTE — Progress Notes (Signed)
D:  Patient awake, alert and cooperative; he denies suicidal and homicidal ideation and AVH; no self-injurious behaviors noted or reported A:  Medications given as scheduled; emotional support provided; encouraged him to seek assistance with needs/concerns R:  Safety maintained on unit

## 2016-10-08 NOTE — [facility] Counselor (Signed)
Pt needs to follow up with [facility] in the next (5) days on M,W,F between the hours of 1230p and 3p to meet with a counselor.

## 2016-10-08 NOTE — Discharge Planning (Signed)
[facility] Observation Unit Case Management Discharge Plan :  Will you be returning to the same living situation after discharge:  To [facility]up with [facility]  At discharge, do you have transportation home?: Yes,  Money given for bus pass x 2  Do you have the ability to pay for your medications: No. [facility] accepts uninsured  Release of information consent forms completed and in the chart;  Patient's signature needed at discharge.  Patient to Follow up at: Follow-up Information    [facility] Follow up on 10/13/2016.   Specialty:  Behavioral Health Why:  Pt needs to follow up with the above mentioned provider in the next (5) days on M,W,F between the hours of 1230p and 3p to meet with a counselor. Contact information: [place] [address] [phone]           Safety Planning and Suicide Prevention discussed: Yes,  Patient verbalized understanding   Harland German 10/08/2016, 1:55 PM

## 2016-10-08 NOTE — Progress Notes (Signed)
Inpatient Diabetes Program Recommendations  AACE/ADA: New Consensus Statement on Inpatient Glycemic Control (2015)  Target Ranges:  Prepandial:   less than 140 mg/dL      Peak postprandial:   less than 180 mg/dL (1-2 hours)      Critically ill patients:  140 - 180 mg/dL   Results for Brad, [name] (MRN [id number]) as of 10/08/2016 09:27  Ref. Range 10/07/2016 06:40 10/07/2016 11:34 10/07/2016 17:01 10/07/2016 21:26  Glucose-Capillary Latest Ref Range: 65 - 99 mg/dL 172 (H) 240 (H) 304 (H) 240 (H)   Results for Brad, [name] (MRN [id number]) as of 10/08/2016 09:27  Ref. Range 10/08/2016 06:25  Glucose-Capillary Latest Ref Range: 65 - 99 mg/dL 224 (H)    Admit with: Substance induced mood disorder   History: DM, CVA, CKD, Homelessness, Cocaine, ETOH  Home DM Meds: Lantus 30 units BID  Current Insulin Orders: Lantus 15 units QHS      Novolog Moderate Correction Scale/ SSI (0-15 units) TID AC       MD- If patient is not discharged today from Observation Unit, please consider the following in-hospital insulin adjustments:  1. Increase Lantus to 15 units BID (50% stated home dose)  2. Increase Novolog Correction Scale/SSI to Resistant scale (0-20 units) TID AC + HS      --Will follow patient during hospitalization--  Wyn Quaker RN, MSN, CDE Diabetes Coordinator Inpatient Glycemic Control Team Team Pager: [phone] (8a-5p)

## 2016-10-08 NOTE — Progress Notes (Addendum)
Written/verbal discharge instructions, prescriptions and follow-up appointments given to patient with verbalization of understanding;  Patient denies suicidal and homicidal ideation. Suicide Prevention information/materials given to patient  All patient belongings returned to patient at time of discharge. Discharged stable condition. $3 for bus passes given.
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
## 2016-10-08 NOTE — Progress Notes (Signed)
[place] OBSERVATION UNIT:  Family/Significant Other Suicide Prevention Education  Suicide Prevention Education:  Patient Refusal for Family/Significant Other Suicide Prevention Education: The patient Brad [name] has refused to provide written consent for family/significant other to be provided Family/Significant Other Suicide Prevention Education during admission and/or prior to discharge.  Physician notified.  Harland German 10/08/2016, 9:41 AM   Harland German, RN 10/08/16  9:41 AM

## 2016-10-09 ENCOUNTER — Telehealth: Payer: Self-pay

## 2016-10-09 NOTE — Telephone Encounter (Addendum)
Transitional Care Clinic Post-discharge Follow-Up Phone Call:  Date of Discharge: 10/08/16 Principal Discharge Diagnosis(es): Substance induced mood disorder  Call Completed:Yes                    With Whom: Patient     Please check all that apply:  X  Patient is knowledgeable of his/her condition(s) and/or treatment. X  Patient is caring for self at home.  ? Patient is receiving assist at home from family and/or caregiver. Family and/or caregiver is knowledgeable of patient's condition(s) and/or treatment. ? Patient is receiving home health services. If so, name of agency.     Medication Reconciliation:  ? Medication list reviewed with patient. X Patient does not have any medications listed on medication list. Will route to Dr. Jarold Song to review.   Activities of Daily Living:  X  Independent ? Needs assist  ? Total Care    Community resources in place for patient:  ? None  ? Home Health/Home DME ? Assisted Living X Support Group-Patient reminded to follow-up with Alcohol and Drug Services on Monday, Wednesday, and Friday to meet with counselor.                Questions/Concerns discussed: Discussed need for Transitional Care appointment with Dr. Jarold Song, and appointment scheduled for 10/14/16 at 1000 with Dr. Jarold Song. Patient indicated he will need transportation to appointment. Will notify Clinic Scheduler so transportation can be arranged. Patient expressed frustration about lack of housing and indicated he is uncertain how long he will be able to afford staying at [facility]. He said he had discussed his lack of housing with his Research officer, trade union; however, she did not have any resources for him. Informed patient he would benefit from speaking with Delana Meyer, SW at [facility] to determine if there are additional housing resources. Patient verbalized understanding and stated he would contact [facility] today at 1600. Patient also stated he has been approved for  Medicaid and now has Physicist, medical. Patient pleased that he now has these resources.

## 2016-10-13 ENCOUNTER — Telehealth: Payer: Self-pay

## 2016-10-13 NOTE — Telephone Encounter (Signed)
Call placed to the patient and confirmed his appointment at the transitional care clinic at [facility] tomorrow, 10/14/16 @ 1000. He stated that he will need transportation to the clinic.  Jacklynn Lewis, [facility] scheduler notified of the need for a cab.  The patient stated that he is doing " all right" no problems/questions reported at this time.

## 2016-10-13 NOTE — Telephone Encounter (Signed)
Message received from patient requesting a call back.   CM returned call and he explained that he needed to reschedule his appointment for tomorrow - 10/14/16 because he needs to meet with his probation officer tomorrow. He noted that his appointment was rescheduled for 10/16/16 and he will need transportation to the clinic.  Jacklynn Lewis, [facility] scheduler notified of the need for a cab for the patient on 10/16/16.  The patient stated that he has a new phone from [facility] and his sponsor paid $10 for the phone for him. He explained how he was in the behavioral health hospital last week and was afraid he was going to end up on the street again. He said that he realized he has " a lot to live for." He noted that he went for a job interview at the [employer] today. 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   He said that he is considering transferring his prescriptions to [facility] as they will deliver the medications and they accept medicaid. This CM stressed with him the importance of keeping his appointment on 10/16/16 with Dr Jarold Song because he needs to obtain his medications.    This CM explained to him that he may be eligible for medicaid transportation and provided him with the contact # [phone] and encouraged him to call to inquire about an assessment.   He said that he has not been to [facility] yet and this CM encouraged him to follow up with [facility] as instructed at discharge.   He spoke about how proud he is of himself and how he has applied for a job. Regarding his housing situation, he said that " I'm going to be all right" and was not concerned about leaving his current residence at the motel. This CM offered praise and encouragement for his accomplishments.

## 2016-10-14 ENCOUNTER — Inpatient Hospital Stay: Payer: Self-pay | Admitting: Family Medicine

## 2016-10-15 ENCOUNTER — Telehealth: Payer: Self-pay

## 2016-10-15 NOTE — Telephone Encounter (Signed)
This Case Manager placed an additional call to patient to remind him of upcoming Transitional Care appointment on 10/16/16 at 1115 with Dr. Jarold Song. Patient aware of his appointment and indicated he needs transportation to his appointment. Will notify [place]. Informed patient that since he now has Medicaid he should contact [facility] and complete a transportation assessment to determine if eligible for Medicaid transportation. Patient indicated he has the phone number for [facility] and tried to complete phone assessment yesterday but was unable to speak with someone on phone. Encouraged patient to continue trying to contact [facility]. Patient verbalized understanding. Reminded patient to bring all of his medications to his upcoming appointment. Patient verbalized understanding. He indicated he switched all of his medications from the [facility] and [facility] because they will deliver his medications to him. No additional needs identified at this time.

## 2016-10-15 NOTE — Telephone Encounter (Signed)
This Case Manager placed call to patient to remind him of upcoming [facility] appointment on 10/16/16 at 1115 with Dr. Jarold Song. Call placed to [phone]; unable to reach patient. HIPPA compliant voicemail left requesting return call.

## 2016-10-16 ENCOUNTER — Ambulatory Visit: Payer: Medicaid Other | Attending: Family Medicine | Admitting: Family Medicine

## 2016-10-16 ENCOUNTER — Encounter: Payer: Self-pay | Admitting: Family Medicine

## 2016-10-16 ENCOUNTER — Encounter: Payer: Self-pay | Admitting: Licensed Clinical Social Worker

## 2016-10-16 VITALS — BP 127/76 | HR 77 | Temp 98.2°F | Ht 71.0 in | Wt 202.2 lb

## 2016-10-16 DIAGNOSIS — E1122 Type 2 diabetes mellitus with diabetic chronic kidney disease: Secondary | ICD-10-CM | POA: Diagnosis not present

## 2016-10-16 DIAGNOSIS — B182 Chronic viral hepatitis C: Secondary | ICD-10-CM | POA: Diagnosis not present

## 2016-10-16 DIAGNOSIS — I130 Hypertensive heart and chronic kidney disease with heart failure and stage 1 through stage 4 chronic kidney disease, or unspecified chronic kidney disease: Secondary | ICD-10-CM | POA: Diagnosis not present

## 2016-10-16 DIAGNOSIS — E1140 Type 2 diabetes mellitus with diabetic neuropathy, unspecified: Secondary | ICD-10-CM | POA: Diagnosis not present

## 2016-10-16 DIAGNOSIS — K7460 Unspecified cirrhosis of liver: Secondary | ICD-10-CM

## 2016-10-16 DIAGNOSIS — Z794 Long term (current) use of insulin: Secondary | ICD-10-CM | POA: Insufficient documentation

## 2016-10-16 DIAGNOSIS — Z888 Allergy status to other drugs, medicaments and biological substances status: Secondary | ICD-10-CM | POA: Diagnosis not present

## 2016-10-16 DIAGNOSIS — Z79899 Other long term (current) drug therapy: Secondary | ICD-10-CM | POA: Insufficient documentation

## 2016-10-16 DIAGNOSIS — Z8546 Personal history of malignant neoplasm of prostate: Secondary | ICD-10-CM | POA: Diagnosis not present

## 2016-10-16 DIAGNOSIS — N183 Chronic kidney disease, stage 3 unspecified: Secondary | ICD-10-CM

## 2016-10-16 DIAGNOSIS — M25571 Pain in right ankle and joints of right foot: Secondary | ICD-10-CM | POA: Diagnosis not present

## 2016-10-16 DIAGNOSIS — IMO0002 Reserved for concepts with insufficient information to code with codable children: Secondary | ICD-10-CM

## 2016-10-16 DIAGNOSIS — E0821 Diabetes mellitus due to underlying condition with diabetic nephropathy: Secondary | ICD-10-CM

## 2016-10-16 DIAGNOSIS — S82891D Other fracture of right lower leg, subsequent encounter for closed fracture with routine healing: Secondary | ICD-10-CM

## 2016-10-16 DIAGNOSIS — F329 Major depressive disorder, single episode, unspecified: Secondary | ICD-10-CM | POA: Diagnosis not present

## 2016-10-16 DIAGNOSIS — I5032 Chronic diastolic (congestive) heart failure: Secondary | ICD-10-CM | POA: Diagnosis not present

## 2016-10-16 DIAGNOSIS — E0865 Diabetes mellitus due to underlying condition with hyperglycemia: Secondary | ICD-10-CM

## 2016-10-16 DIAGNOSIS — E119 Type 2 diabetes mellitus without complications: Secondary | ICD-10-CM | POA: Diagnosis present

## 2016-10-16 DIAGNOSIS — F1994 Other psychoactive substance use, unspecified with psychoactive substance-induced mood disorder: Secondary | ICD-10-CM | POA: Diagnosis not present

## 2016-10-16 DIAGNOSIS — K219 Gastro-esophageal reflux disease without esophagitis: Secondary | ICD-10-CM | POA: Insufficient documentation

## 2016-10-16 DIAGNOSIS — M1990 Unspecified osteoarthritis, unspecified site: Secondary | ICD-10-CM | POA: Diagnosis not present

## 2016-10-16 DIAGNOSIS — F1410 Cocaine abuse, uncomplicated: Secondary | ICD-10-CM

## 2016-10-16 MED ORDER — AMLODIPINE BESYLATE 10 MG PO TABS: 10.0000 mg | ORAL_TABLET | Freq: Every day | ORAL | 3 refills | Status: DC

## 2016-10-16 MED ORDER — INSULIN GLARGINE 100 UNIT/ML SOLOSTAR PEN: 50.0000 [IU] | PEN_INJECTOR | Freq: Every day | SUBCUTANEOUS | 3 refills | Status: DC

## 2016-10-16 MED ORDER — ATORVASTATIN CALCIUM 20 MG PO TABS: 40.0000 mg | ORAL_TABLET | Freq: Every day | ORAL | 3 refills | Status: DC

## 2016-10-16 MED ORDER — OMEPRAZOLE 20 MG PO CPDR: 20.0000 mg | DELAYED_RELEASE_CAPSULE | Freq: Every day | ORAL | 3 refills | Status: DC

## 2016-10-16 MED ORDER — ACCU-CHEK AVIVA DEVI: 0 refills | Status: DC

## 2016-10-16 MED ORDER — GABAPENTIN 300 MG PO CAPS: 300.0000 mg | ORAL_CAPSULE | Freq: Three times a day (TID) | ORAL | 3 refills | Status: DC

## 2016-10-16 MED ORDER — GLUCOSE BLOOD VI STRP: ORAL_STRIP | 12 refills | Status: DC

## 2016-10-16 MED ORDER — CLONIDINE HCL 0.1 MG PO TABS: 0.1000 mg | ORAL_TABLET | Freq: Two times a day (BID) | ORAL | 3 refills | Status: DC

## 2016-10-16 MED ORDER — ACCU-CHEK SOFTCLIX LANCET DEV MISC: 5 refills | Status: DC
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
## 2016-10-16 NOTE — Progress Notes (Signed)
Dr. Jarold Song indicated she would like for this Case Manager to meet with patient and provide phone number for Dr. Erlinda Hong as well as discuss cost of medications. This Case Manager met with patient while in clinic for Transitional Care appointment. 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 Erlinda Hong to find out how long he will be able to stand while working. This Case Manager provided patient with Dr. Phoebe Sharps contact information.    Patient expressed frustration over having to pay copay for his office visit today. Explained that now that he has Medicaid he will have a copay for office visits as well as medications. Informed him he will likely have a $3.00 copay for each of his medications. Patient now getting his medications from [facility]. Patient said he was not charged this yesterday when his medications were delivered but plans to call [facility] today to discuss.  Patient also indicated he needs $190 dollars today to secure his room at the [facility] for the week. Christa See, SW spoke with patient and provided him with housing resources. This Case Manager provided patient with information about Medicaid transportation and encouraged patient once again to contact [facility] transportation to complete transportation to determine if eligible for Medicaid transportation.  Patient appreciative of information.
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
## 2016-10-16 NOTE — Progress Notes (Signed)
Ran out of test strips- send request to [facility]

## 2016-10-28 ENCOUNTER — Telehealth: Payer: Self-pay

## 2016-10-28 NOTE — Telephone Encounter (Signed)
Attempted to contact  the patient to check on his status. Call placed to # [phone] (M) and a HIPAA compliant voicemail message was left requesting a call back to # [phone] or [phone].

## 2016-10-29 ENCOUNTER — Ambulatory Visit: Payer: Medicaid Other | Admitting: Podiatry

## 2016-11-03 ENCOUNTER — Ambulatory Visit (INDEPENDENT_AMBULATORY_CARE_PROVIDER_SITE_OTHER): Payer: Medicaid Other | Admitting: Orthopaedic Surgery

## 2016-11-05 ENCOUNTER — Telehealth: Payer: Self-pay | Admitting: *Deleted

## 2016-11-05 ENCOUNTER — Telehealth: Payer: Self-pay

## 2016-11-05 NOTE — Telephone Encounter (Signed)
Call received from the patient informing this CM that he has misplaced his phone but can still check his messages. He said that he was calling from a phone " where I'm at now."  He noted that he is currently living at [facility] and is participating in the Drug program during the day, He noted that he has been there about 2 weeks and can stay up to 3 months. He said that in addition to shelter, he receives his meals.  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 He said that he will not be working at [employer] because they wanted him to work the night shift and the busses stop running and he would not have transportation home. He also noted that he was denied medicaid transportation.  He reported that he needs to contact his attorney to check on the status of his disability application.  He stated that he has been taking his medications and continues to have them delivered from [facility].  He  also noted that he is still having pain in his right  foot and he needs to see Dr Erlinda Hong.  Informed him that he had an appointment scheduled with Dr Erlinda Hong on 11/03/16 and he missed that appointment because he was not reminded about it.. Provided him with the contact # for Dr Erlinda Hong - # [phone].   He also wanted to make a follow up appointment at [facility] and an appointment was scheduled for 11/11/16 @ 1015. The call was placed on hold while the appointment was being made and then the call was lost.   Call placed [phone] (M) to inform him of the appointment and a HIPAA compliant voicemail message was left requesting a call back to # [phone] or [phone].

## 2016-11-05 NOTE — Telephone Encounter (Signed)
Called patient and left a voice mail stating he was referred to Dr. Linus Salmons by his PCP and to call the clinic to schedule an appointment. Brad [name]

## 2016-11-06 ENCOUNTER — Telehealth: Payer: Self-pay

## 2016-11-06 NOTE — Telephone Encounter (Signed)
This Case Manager placed an additional call to patient to inform him of his upcoming [facility] appointment on 11/11/16 at 1015. Call placed to [phone]; however, unable to reach patient. Unable to leave voicemail as voicemail did not pick up.

## 2016-11-06 NOTE — Telephone Encounter (Signed)
This Case Manager placed call to patient to inform him of his Transitional Care follow-up appointment on 11/11/16 at 1015. Call placed to [phone]; however, unable to reach. HIPPA compliant voicemail left requesting return call.

## 2016-11-07 ENCOUNTER — Telehealth: Payer: Self-pay

## 2016-11-07 ENCOUNTER — Encounter (INDEPENDENT_AMBULATORY_CARE_PROVIDER_SITE_OTHER): Payer: Self-pay | Admitting: Orthopaedic Surgery

## 2016-11-07 ENCOUNTER — Ambulatory Visit (INDEPENDENT_AMBULATORY_CARE_PROVIDER_SITE_OTHER): Payer: Medicaid Other | Admitting: Orthopaedic Surgery

## 2016-11-07 ENCOUNTER — Ambulatory Visit (INDEPENDENT_AMBULATORY_CARE_PROVIDER_SITE_OTHER): Payer: Medicaid Other

## 2016-11-07 DIAGNOSIS — S82891D Other fracture of right lower leg, subsequent encounter for closed fracture with routine healing: Secondary | ICD-10-CM
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
## 2016-11-07 NOTE — Telephone Encounter (Signed)
This Case Manager placed an additional call to patient to inform him of his upcoming [facility] appointment on 11/11/16 at 1015. Call placed to [phone]; however, unable to reach patient. Unable to leave voicemail as voicemail did not pick up. In addition call placed to [facility] to speak with patient; however, informed patient had "already gone for the day."

## 2016-11-10 ENCOUNTER — Telehealth: Payer: Self-pay

## 2016-11-10 NOTE — Telephone Encounter (Signed)
Attempted to contact the patient to inform him of his appointment at [facility] tomorrow, 11/11/16 @ 1015. Call placed to # [phone] and a HIPAA compliant voicemail message was left requesting a call back to # [phone] or [phone]. Call also placed to [facility] # [phone] and the woman who answered stated that she did not recognize the patient's name and was not aware who he was .

## 2016-11-10 NOTE — Telephone Encounter (Signed)
Call received from the patient returning CM call. Reminded him of his appointment tomorrow at [facility] - 11/11/16 @ 1015. He said he would be at [facility] at that time.  Informed him that this CM spoke to Ms Shirlee Limerick at [facility] and she stated that they would provide transportation the clinic.  He said that he would call [place] in the morning if the transportation is not going to work out for him.   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 He verbalized frustrations will his current living situation. Provided encouragement to him and reminded him that Christa See, LCSW at [facility] is available to him for counseling/community resource information if needed.

## 2016-11-10 NOTE — Telephone Encounter (Signed)
Message received from the patient requesting a call back to # [phone]. Call returned and Charlette Caffey, clinical director with [facility] answered. She stated that the patient would not be returning to their facility until 1100 tomorrow, he was currently at is apartment, She noted that they would try to get a message to him tonight to return this CM call. She stated that he is usually at their facility from approximately 1000-1430/1500 every day.  She then inquired if this was regarding his appointment tomorrow as he had expressed concerns about getting to the clinic. This CM confirmed that his appointment is at 1015 tomorrow and Ms Shirlee Limerick stated that they would make sure that he gets to the clinic tomorrow and they would provide transportation.

## 2016-11-11 ENCOUNTER — Ambulatory Visit: Payer: Self-pay | Admitting: Family Medicine

## 2016-11-24 ENCOUNTER — Ambulatory Visit: Payer: Self-pay

## 2016-12-01 ENCOUNTER — Telehealth: Payer: Self-pay

## 2016-12-01 NOTE — Telephone Encounter (Signed)
Fax received from Sallee Provencal, [place]  # [phone], requesting completion of a PCS request for the patient. She stated that the signed form can be returned to her and she will sent to [facility]. Informed her that the request needed to be discussed with Dr Jarold Song.  This CM spoke to Dr Jarold Song regarding the request and she noted that the patient will need to schedule an appointment to have his needs re-assessed.  Call placed to K. [place] and informed her that the patient will need to schedule an appointment with Dr Jarold Song to be assessed. She said that she would notify the patient to call the [facility] to schedule an appointment.

## 2016-12-01 NOTE — Telephone Encounter (Signed)
Call received from the patient. 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 He noted that the apartment is on [address]. He said that as of tomorrow, he will no longer be a participant in the treatment program. He noted that he continues to try to avoid drugs and has worked hard to be able to get a job and new place to live. He noted how proud he was of himself. This CM commended him on his accomplishments.  He also stated that he went to church last week.   He noted that the NP at the treatment center has been following him medically and stated that she has helped him when his " legs broke out." because he needed an antibiotic and to have his legs " wrapped."  He reported that they are now healing and did not explain in any more detail. Reminded him of the importance of taking his medications as ordered and he said that he was. Discussed the need for [facility] services and the qualifications. He admitted that he does not really need PCS at this time as he will now be working and is getting around independently. Explained to him that there are not any appointments available at the clinic right now and encouraged him to call the clinic at a later time to schedule an appointment with Dr Jarold Song and he stated that he would and that he would also continue to see the NP at the treatment center.    He also reported that his original phone # [phone],  will be back in service tomorrow.

## 2016-12-04 ENCOUNTER — Telehealth (INDEPENDENT_AMBULATORY_CARE_PROVIDER_SITE_OTHER): Payer: Self-pay | Admitting: *Deleted

## 2016-12-04 ENCOUNTER — Telehealth: Payer: Self-pay

## 2016-12-04 NOTE — Telephone Encounter (Signed)
yes

## 2016-12-04 NOTE — Telephone Encounter (Signed)
Pt called stating he needs work note stating he can stand for an extended period of time and that he has been released from Dr's care. Fax:[phone] attn Mr. Gertie Exon or sharon. Pt requesting call back [phone].

## 2016-12-04 NOTE — Telephone Encounter (Signed)
Faxed note.

## 2016-12-04 NOTE — Telephone Encounter (Signed)
This Case Manager received call from patient who indicated he needed a letter stating he could stand on his foot for an extended period of time to work. Informed him that at his appointment on 10/16/16 Dr. Jarold Song recommended patient follow-up with Dr. Erlinda Hong to determine clearance for work. Patient indicated he followed up with Dr. Erlinda Hong on 11/07/16. Recommended patient contact Dr. Phoebe Sharps office for clearance and letter. Patient verbalized understanding.

## 2016-12-04 NOTE — Telephone Encounter (Signed)
Please advise 

## 2016-12-17 ENCOUNTER — Telehealth: Payer: Self-pay

## 2016-12-17 NOTE — Telephone Encounter (Signed)
Call received from [facility] with [place], inquiring if the patient came to an appointment on 12/11/16. This CM was not able to speak to her at the time of the call.  Call returned to [facility] [phone]  and informed her that this CM is not able to disclose any information about the patient and the patient would need to call [facility] if he has any questions. She stated that she had called the patient and she was informed  that he did not keep an appointment on 12/11/16.

## 2017-02-02 ENCOUNTER — Telehealth: Payer: Self-pay

## 2017-02-02 NOTE — Telephone Encounter (Signed)
Call received from patient stating that he needs transportation to his appointment with Dr Jarold Song on 02/06/17. Informed him that he has medicaid and he then stated that he had a transportation assessment done and just needs to call DSS to schedule the ride.  Also discussed applying for SCAT but he said that he does not have any money to pay for SCAT.  He said this his disability application is in the appeals process  He noted that he is still in the same addiction program that he has been  in and they continue to provide him housing. 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   He said that overall he is  " doing good" and is now off probation.

## 2017-02-04 ENCOUNTER — Ambulatory Visit: Payer: Self-pay | Admitting: Family Medicine

## 2017-02-06 ENCOUNTER — Ambulatory Visit: Payer: Self-pay | Admitting: Family Medicine

## 2017-02-09 ENCOUNTER — Other Ambulatory Visit: Payer: Self-pay | Admitting: Family Medicine

## 2017-02-25 ENCOUNTER — Telehealth: Payer: Self-pay

## 2017-02-25 NOTE — Telephone Encounter (Addendum)
Call received from the patient. He wanted to inform this CM that his wife died last 04-23-23. He said that they had not been together but had recently tried to get back together. He said that she was 58yo and was on dialysis. He noted that the funeral is planned for Friday, 02/27/17. He also said that he has a 1 yo son who is staying at the Extended Stay with his sister who is here from [place]. This CM offered condolences.   The patient stated that he has been working 5 days a week at [employer] but is on a leave now for the funeral. He also noted that he is continuing to follow up for medical care with the NP at [facility].  He inquired about social work support/counseling for he and his son and would like to meet with [facility].  He was not interested in contacting [employer].  Instructed him that he would need to make an appointment to meet with the SW and he stated that he understood but would need to call back as he had to go.   Update provided to Christa See, LCSW.

## 2017-03-09 ENCOUNTER — Other Ambulatory Visit: Payer: Self-pay | Admitting: Family Medicine

## 2017-03-11 ENCOUNTER — Other Ambulatory Visit: Payer: Self-pay | Admitting: Pharmacist

## 2017-03-11 MED ORDER — INSULIN GLARGINE 100 UNIT/ML SOLOSTAR PEN: 50.0000 [IU] | PEN_INJECTOR | Freq: Every day | SUBCUTANEOUS | 0 refills | Status: DC

## 2017-03-11 MED ORDER — OMEPRAZOLE 20 MG PO CPDR: 20.0000 mg | DELAYED_RELEASE_CAPSULE | Freq: Every day | ORAL | 0 refills | Status: DC

## 2017-03-11 MED ORDER — INSULIN PEN NEEDLE 31G X 8 MM MISC: 5 refills | Status: DC

## 2017-03-11 MED ORDER — GLUCOSE BLOOD VI STRP: ORAL_STRIP | 12 refills | Status: DC

## 2017-04-08 ENCOUNTER — Other Ambulatory Visit: Payer: Self-pay | Admitting: Family Medicine

## 2017-05-08 ENCOUNTER — Encounter: Payer: Self-pay | Admitting: Family Medicine

## 2017-05-08 ENCOUNTER — Telehealth: Payer: Self-pay

## 2017-05-08 ENCOUNTER — Ambulatory Visit: Payer: Medicaid Other | Attending: Family Medicine | Admitting: Family Medicine

## 2017-05-08 VITALS — BP 152/85 | HR 79 | Temp 98.2°F | Resp 18 | Ht 71.0 in | Wt 206.0 lb

## 2017-05-08 DIAGNOSIS — E1122 Type 2 diabetes mellitus with diabetic chronic kidney disease: Secondary | ICD-10-CM | POA: Diagnosis not present

## 2017-05-08 DIAGNOSIS — Z79899 Other long term (current) drug therapy: Secondary | ICD-10-CM | POA: Insufficient documentation

## 2017-05-08 DIAGNOSIS — E1121 Type 2 diabetes mellitus with diabetic nephropathy: Secondary | ICD-10-CM | POA: Insufficient documentation

## 2017-05-08 DIAGNOSIS — R21 Rash and other nonspecific skin eruption: Secondary | ICD-10-CM | POA: Diagnosis present

## 2017-05-08 DIAGNOSIS — Z8781 Personal history of (healed) traumatic fracture: Secondary | ICD-10-CM

## 2017-05-08 DIAGNOSIS — I130 Hypertensive heart and chronic kidney disease with heart failure and stage 1 through stage 4 chronic kidney disease, or unspecified chronic kidney disease: Secondary | ICD-10-CM | POA: Diagnosis not present

## 2017-05-08 DIAGNOSIS — I10 Essential (primary) hypertension: Secondary | ICD-10-CM

## 2017-05-08 DIAGNOSIS — Z794 Long term (current) use of insulin: Secondary | ICD-10-CM | POA: Diagnosis not present

## 2017-05-08 DIAGNOSIS — G629 Polyneuropathy, unspecified: Secondary | ICD-10-CM | POA: Insufficient documentation

## 2017-05-08 DIAGNOSIS — E1142 Type 2 diabetes mellitus with diabetic polyneuropathy: Secondary | ICD-10-CM | POA: Insufficient documentation

## 2017-05-08 DIAGNOSIS — Z9889 Other specified postprocedural states: Secondary | ICD-10-CM

## 2017-05-08 DIAGNOSIS — I5032 Chronic diastolic (congestive) heart failure: Secondary | ICD-10-CM | POA: Diagnosis not present

## 2017-05-08 DIAGNOSIS — N183 Chronic kidney disease, stage 3 (moderate): Secondary | ICD-10-CM | POA: Diagnosis not present

## 2017-05-08 DIAGNOSIS — Z888 Allergy status to other drugs, medicaments and biological substances status: Secondary | ICD-10-CM | POA: Diagnosis not present

## 2017-05-08 DIAGNOSIS — E1140 Type 2 diabetes mellitus with diabetic neuropathy, unspecified: Secondary | ICD-10-CM

## 2017-05-08 DIAGNOSIS — K219 Gastro-esophageal reflux disease without esophagitis: Secondary | ICD-10-CM | POA: Diagnosis not present

## 2017-05-08 DIAGNOSIS — Z967 Presence of other bone and tendon implants: Secondary | ICD-10-CM

## 2017-05-08 DIAGNOSIS — E1165 Type 2 diabetes mellitus with hyperglycemia: Secondary | ICD-10-CM | POA: Diagnosis not present

## 2017-05-08 DIAGNOSIS — IMO0002 Reserved for concepts with insufficient information to code with codable children: Secondary | ICD-10-CM

## 2017-05-08 DIAGNOSIS — E0865 Diabetes mellitus due to underlying condition with hyperglycemia: Secondary | ICD-10-CM

## 2017-05-08 LAB — POCT URINALYSIS DIPSTICK
Bilirubin, UA: NEGATIVE
Blood, UA: NEGATIVE
Glucose, UA: 500
Ketones, UA: NEGATIVE
Leukocytes, UA: NEGATIVE
Nitrite, UA: NEGATIVE
Protein, UA: >=300
Spec Grav, UA: <=1.005 — AB (ref 1.010–1.025)
Urobilinogen, UA: 0.2 E.U./dL
pH, UA: 6 (ref 5.0–8.0)

## 2017-05-08 LAB — POCT UA - MICROALBUMIN
Albumin/Creatinine Ratio, Urine, POC: >300
Creatinine, POC: 50 mg/dL
Microalbumin Ur, POC: 150 mg/L

## 2017-05-08 LAB — GLUCOSE, POCT (MANUAL RESULT ENTRY)
POC Glucose: 560 mg/dl — AB (ref 70–99)
POC Glucose: 485 mg/dl — AB (ref 70–99)

## 2017-05-08 LAB — POCT GLYCOSYLATED HEMOGLOBIN (HGB A1C): Hemoglobin A1C: 10.9

## 2017-05-08 MED ORDER — GLUCOSE BLOOD VI STRP: ORAL_STRIP | 12 refills | Status: DC

## 2017-05-08 MED ORDER — INSULIN ASPART 100 UNIT/ML ~~LOC~~ SOLN
25.0000 [IU] | Freq: Once | SUBCUTANEOUS | Status: AC
  Administered 2017-05-08: 25 [IU] via SUBCUTANEOUS

## 2017-05-08 MED ORDER — OMEPRAZOLE 20 MG PO CPDR: 20.0000 mg | DELAYED_RELEASE_CAPSULE | Freq: Every day | ORAL | 3 refills | Status: DC

## 2017-05-08 MED ORDER — GABAPENTIN 300 MG PO CAPS: 300.0000 mg | ORAL_CAPSULE | Freq: Three times a day (TID) | ORAL | 3 refills | Status: DC

## 2017-05-08 MED ORDER — CLONIDINE HCL 0.1 MG PO TABS: 0.1000 mg | ORAL_TABLET | Freq: Two times a day (BID) | ORAL | 3 refills | Status: DC

## 2017-05-08 MED ORDER — ACCU-CHEK SOFTCLIX LANCET DEV MISC: 12 refills | Status: DC

## 2017-05-08 MED ORDER — HYDROCORTISONE 2.5 % EX CREA: TOPICAL_CREAM | Freq: Two times a day (BID) | CUTANEOUS | 1 refills | Status: DC

## 2017-05-08 MED ORDER — AMLODIPINE BESYLATE 10 MG PO TABS: 10.0000 mg | ORAL_TABLET | Freq: Every day | ORAL | 3 refills | Status: DC

## 2017-05-08 MED ORDER — ACCU-CHEK AVIVA PLUS W/DEVICE KIT: 1.0000 | PACK | Freq: Three times a day (TID) | 0 refills | Status: DC

## 2017-05-08 MED ORDER — ATORVASTATIN CALCIUM 20 MG PO TABS: 40.0000 mg | ORAL_TABLET | Freq: Every day | ORAL | 3 refills | Status: DC

## 2017-05-08 NOTE — Telephone Encounter (Signed)
Met with the patient when he was in the clinic today for his appointment with Dr Jarold Song.  He explained that he was let go from his job and has been staying with different people  and he has not been able to find permanent housing.  He said that he is now staying at the [facility] and noted that his parole office helped with securing a bed there. He said that he is able to eat his meals there and did not report any problems accessing food. He also noted that he still receives food stamps. He said that he has been in contact with his attorney about his disability and is hoping to hear a determination soon as he has no income. Provided him with 2 bus passes to get to [employer] and then to get back to [employer].

## 2017-05-08 NOTE — Patient Instructions (Signed)
Diabetes Mellitus and Food It is important for you to manage your blood sugar (glucose) level. Your blood glucose level can be greatly affected by what you eat. Eating healthier foods in the appropriate amounts throughout the day at about the same time each day will help you control your blood glucose level. It can also help slow or prevent worsening of your diabetes mellitus. Healthy eating may even help you improve the level of your blood pressure and reach or maintain a healthy weight. General recommendations for healthful eating and cooking habits include:  Eating meals and snacks regularly. Avoid going long periods of time without eating to lose weight.  Eating a diet that consists mainly of plant-based foods, such as fruits, vegetables, nuts, legumes, and whole grains.  Using low-heat cooking methods, such as baking, instead of high-heat cooking methods, such as deep frying.  Work with your dietitian to make sure you understand how to use the Nutrition Facts information on food labels. How can food affect me? Carbohydrates Carbohydrates affect your blood glucose level more than any other type of food. Your dietitian will help you determine how many carbohydrates to eat at each meal and teach you how to count carbohydrates. Counting carbohydrates is important to keep your blood glucose at a healthy level, especially if you are using insulin or taking certain medicines for diabetes mellitus. Alcohol Alcohol can cause sudden decreases in blood glucose (hypoglycemia), especially if you use insulin or take certain medicines for diabetes mellitus. Hypoglycemia can be a life-threatening condition. Symptoms of hypoglycemia (sleepiness, dizziness, and disorientation) are similar to symptoms of having too much alcohol. If your health care provider has given you approval to drink alcohol, do so in moderation and use the following guidelines:  Women should not have more than one drink per day, and men  should not have more than two drinks per day. One drink is equal to: ? 12 oz of beer. ? 5 oz of wine. ? 1 oz of hard liquor.  Do not drink on an empty stomach.  Keep yourself hydrated. Have water, diet soda, or unsweetened iced tea.  Regular soda, juice, and other mixers might contain a lot of carbohydrates and should be counted.  What foods are not recommended? As you make food choices, it is important to remember that all foods are not the same. Some foods have fewer nutrients per serving than other foods, even though they might have the same number of calories or carbohydrates. It is difficult to get your body what it needs when you eat foods with fewer nutrients. Examples of foods that you should avoid that are high in calories and carbohydrates but low in nutrients include:  Trans fats (most processed foods list trans fats on the Nutrition Facts label).  Regular soda.  Juice.  Candy.  Sweets, such as cake, pie, doughnuts, and cookies.  Fried foods.  What foods can I eat? Eat nutrient-rich foods, which will nourish your body and keep you healthy. The food you should eat also will depend on several factors, including:  The calories you need.  The medicines you take.  Your weight.  Your blood glucose level.  Your blood pressure level.  Your cholesterol level.  You should eat a variety of foods, including:  Protein. ? Lean cuts of meat. ? Proteins low in saturated fats, such as fish, egg whites, and beans. Avoid processed meats.  Fruits and vegetables. ? Fruits and vegetables that may help control blood glucose levels, such as apples,   mangoes, and yams.  Dairy products. ? Choose fat-free or low-fat dairy products, such as milk, yogurt, and cheese.  Grains, bread, pasta, and rice. ? Choose whole grain products, such as multigrain bread, whole oats, and brown rice. These foods may help control blood pressure.  Fats. ? Foods containing healthful fats, such as  nuts, avocado, olive oil, canola oil, and fish.  Does everyone with diabetes mellitus have the same meal plan? Because every person with diabetes mellitus is different, there is not one meal plan that works for everyone. It is very important that you meet with a dietitian who will help you create a meal plan that is just right for you. This information is not intended to replace advice given to you by your health care provider. Make sure you discuss any questions you have with your health care provider. Document Released: 09/04/2005 Document Revised: 05/15/2016 Document Reviewed: 11/04/2013 Elsevier Interactive Patient Education  2017 [employer]  
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
## 2017-05-08 NOTE — Progress Notes (Signed)
Patient is here for FU  Patient has taken medication today. Patient has eaten today.  Patient complains of right ankle pain being present at a 10.  Patient tolerated 25 units well today.

## 2017-05-09 LAB — CMP14+EGFR
ALT: 54 IU/L — ABNORMAL HIGH (ref 0–44)
AST: 55 IU/L — ABNORMAL HIGH (ref 0–40)
Albumin/Globulin Ratio: 1 — ABNORMAL LOW (ref 1.2–2.2)
Albumin: 4.2 g/dL (ref 3.5–5.5)
Alkaline Phosphatase: 137 IU/L — ABNORMAL HIGH (ref 39–117)
BUN/Creatinine Ratio: 11 (ref 9–20)
BUN: 21 mg/dL (ref 6–24)
Bilirubin Total: 0.6 mg/dL (ref 0.0–1.2)
CO2: 26 mmol/L (ref 18–29)
Calcium: 9.3 mg/dL (ref 8.7–10.2)
Chloride: 89 mmol/L — ABNORMAL LOW (ref 96–106)
Creatinine, Ser: 1.92 mg/dL — ABNORMAL HIGH (ref 0.76–1.27)
GFR calc Af Amer: 44 mL/min/{1.73_m2} — ABNORMAL LOW (ref >59)
GFR calc non Af Amer: 38 mL/min/{1.73_m2} — ABNORMAL LOW (ref >59)
Globulin, Total: 4.1 g/dL (ref 1.5–4.5)
Glucose: 414 mg/dL — ABNORMAL HIGH (ref 65–99)
Potassium: 3.5 mmol/L (ref 3.5–5.2)
Sodium: 130 mmol/L — ABNORMAL LOW (ref 134–144)
Total Protein: 8.3 g/dL (ref 6.0–8.5)

## 2017-05-09 LAB — DRUG SCREEN, URINE
Amphetamines, Urine: NEGATIVE ng/mL
Barbiturate screen, urine: NEGATIVE ng/mL
Benzodiazepine Quant, Ur: NEGATIVE ng/mL
Cannabinoid Quant, Ur: NEGATIVE ng/mL
Cocaine (Metab.): NEGATIVE ng/mL
Opiate Quant, Ur: NEGATIVE ng/mL
PCP Quant, Ur: NEGATIVE ng/mL

## 2017-05-11 ENCOUNTER — Encounter: Payer: Self-pay | Admitting: Pediatric Intensive Care

## 2017-05-11 ENCOUNTER — Telehealth: Payer: Self-pay

## 2017-05-11 NOTE — Telephone Encounter (Signed)
Attempted to contact the patient to verify how much lantus he has been administering.   Call placed to the [facility] # [phone].  Spoke to [place] who stated that the patient is staying there but was not there at this time.Message left requesting the patient return call to # [phone].

## 2017-05-11 NOTE — Telephone Encounter (Signed)
Call received from the patient. He stated that he is taking the lantus 50 units at night. He said that he picked up all of his medications at [facility] but was not able to get the glucometer because he was just recently given one. He said that the place he was living on [address] will not return his possessions to him which includes his glucometer.  He is concerned about how he will monitor his blood sugar.    This CM spoke to Veneda Melter, [place] who stated that the pharmacy is not able to provide another glucometer to him free of charge because he has medicaid. He would need to pay for the meter but he has no income.   He said that if he still has no phone and if he needs to be contacted, he can be reached at [facility] or through his friend, Mr Ace Gins # [phone].  Message routed to Dr Jarold Song

## 2017-05-12 MED ORDER — INSULIN GLARGINE 100 UNIT/ML SOLOSTAR PEN: 57.0000 [IU] | PEN_INJECTOR | Freq: Every day | SUBCUTANEOUS | 0 refills | Status: DC

## 2017-05-12 NOTE — Telephone Encounter (Signed)
Please advise him to increase his Lantus to 57 units daily at bedtime. Thank you

## 2017-05-12 NOTE — Telephone Encounter (Signed)
Call placed to [place] # [phone] to inquire if the patient can be given a new glucometer as he is not able to locate the machine that he had. Spoke to [place] who stated that the patient should come to the pharmacy and ask for [name] and they will help him get a new machine. Informed him that the patient may not be able to come to the pharmacy today but may be able to come tomorrow and he said that they would help him whenever he comes in.  Attempted to contact the patient to inform him of the increased lantus dose and need to go to [place] to obtain a new glucometer.  Call placed to [facility], spoke to [place] who stated that the patient was not there. He took a message for the patient to call this CM back at # [phone].  Call placed to Mr Ace Gins # [phone] and requested that he ask the patient to call this CM back at # [phone].  The patient had informed this CM that Mr Ace Gins could be contacted to give him a message if needed.

## 2017-05-12 NOTE — Telephone Encounter (Signed)
Attempted to contact the patient at [facility] to inform him of the increase in lantus.  Spoke to [place] who stated that is would be best to try to call him again around 1630 as he is not answering her when she calls his name. She did not want to take a message for him

## 2017-05-13 ENCOUNTER — Telehealth: Payer: Self-pay

## 2017-05-13 NOTE — Telephone Encounter (Signed)
Attempted to contact the patient to inform him that he is to increase his insulin to 57 units at night and also to inform him that he needs to go to [employer] [facility] and [name] will assist him in obtaining a glucometer.  Call placed to [facility] and left a message with Wells Guiles to have the patient return the call to this CM  # [phone].

## 2017-05-13 NOTE — Telephone Encounter (Signed)
Attempted again to contact the patient to inform him of the increase in insulin and need to go to [place] to pick up a glucometer. Call placed to [facility] and spoke to [place].  She said that the patient is not there but the message that this CM left for him earlier is still there, so he has not picked it up.

## 2017-05-21 ENCOUNTER — Telehealth: Payer: Self-pay

## 2017-05-21 NOTE — Telephone Encounter (Signed)
Call received from the patient.  He stated that he just received the message at [facility] that this CM had called.  He also noted that he has not spoken to Mr Ace Gins.  Informed him that Dr Jarold Song would like him to increase his lantus to 57 units at bedtime and he stated that he understood  This CM also informed him that he can go to [employer] [facility] and speak to [name] about the glucometer. He stated that he knows [name] and will follow up.  He also reported  that he got a job at [employer].  This CM offered congratulations on this accomplishment. He was very pleased with this accomplishment.

## 2017-05-26 ENCOUNTER — Telehealth: Payer: Self-pay | Admitting: *Deleted

## 2017-05-26 NOTE — Telephone Encounter (Signed)
MA advised "person" to ask patient to return a phone call to [facility].

## 2017-05-26 NOTE — Telephone Encounter (Signed)
-----   Message from Arnoldo Morale, MD sent at 05/11/2017  4:05 PM EDT ----- Kidney function shows a slight decline and optimal glycemic control will improve this. His liver enzymes are slightly elevated, we will monitor at this and repeat at the next visit.
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
## 2017-05-30 IMAGING — CR DG CHEST 2V
2 series · 2 of 2 positions shown · non-contrast
Comparison: November 14, 2015

CLINICAL DATA: Chest pain

EXAM:
CHEST  2 VIEW

[chest pa]
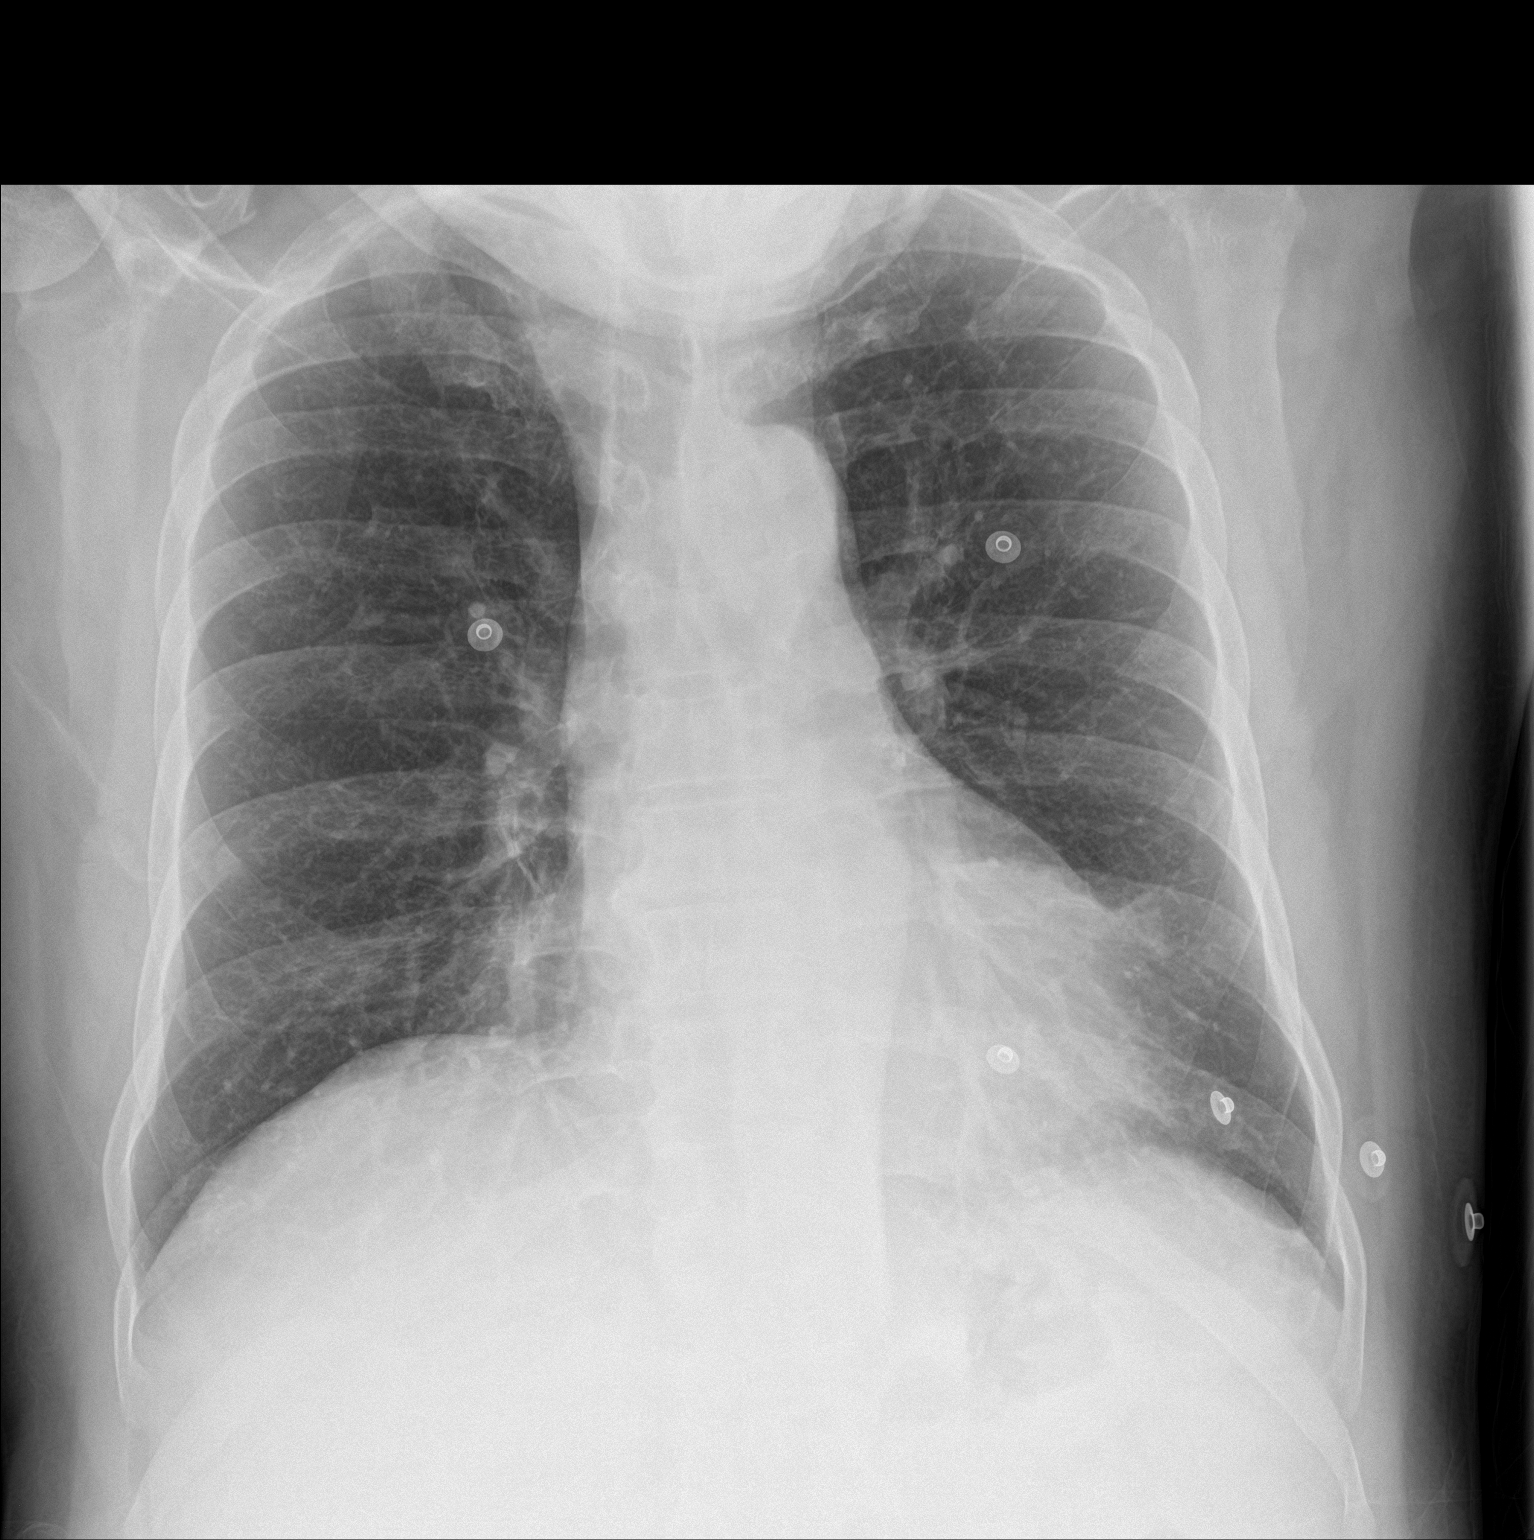

[chest lat]
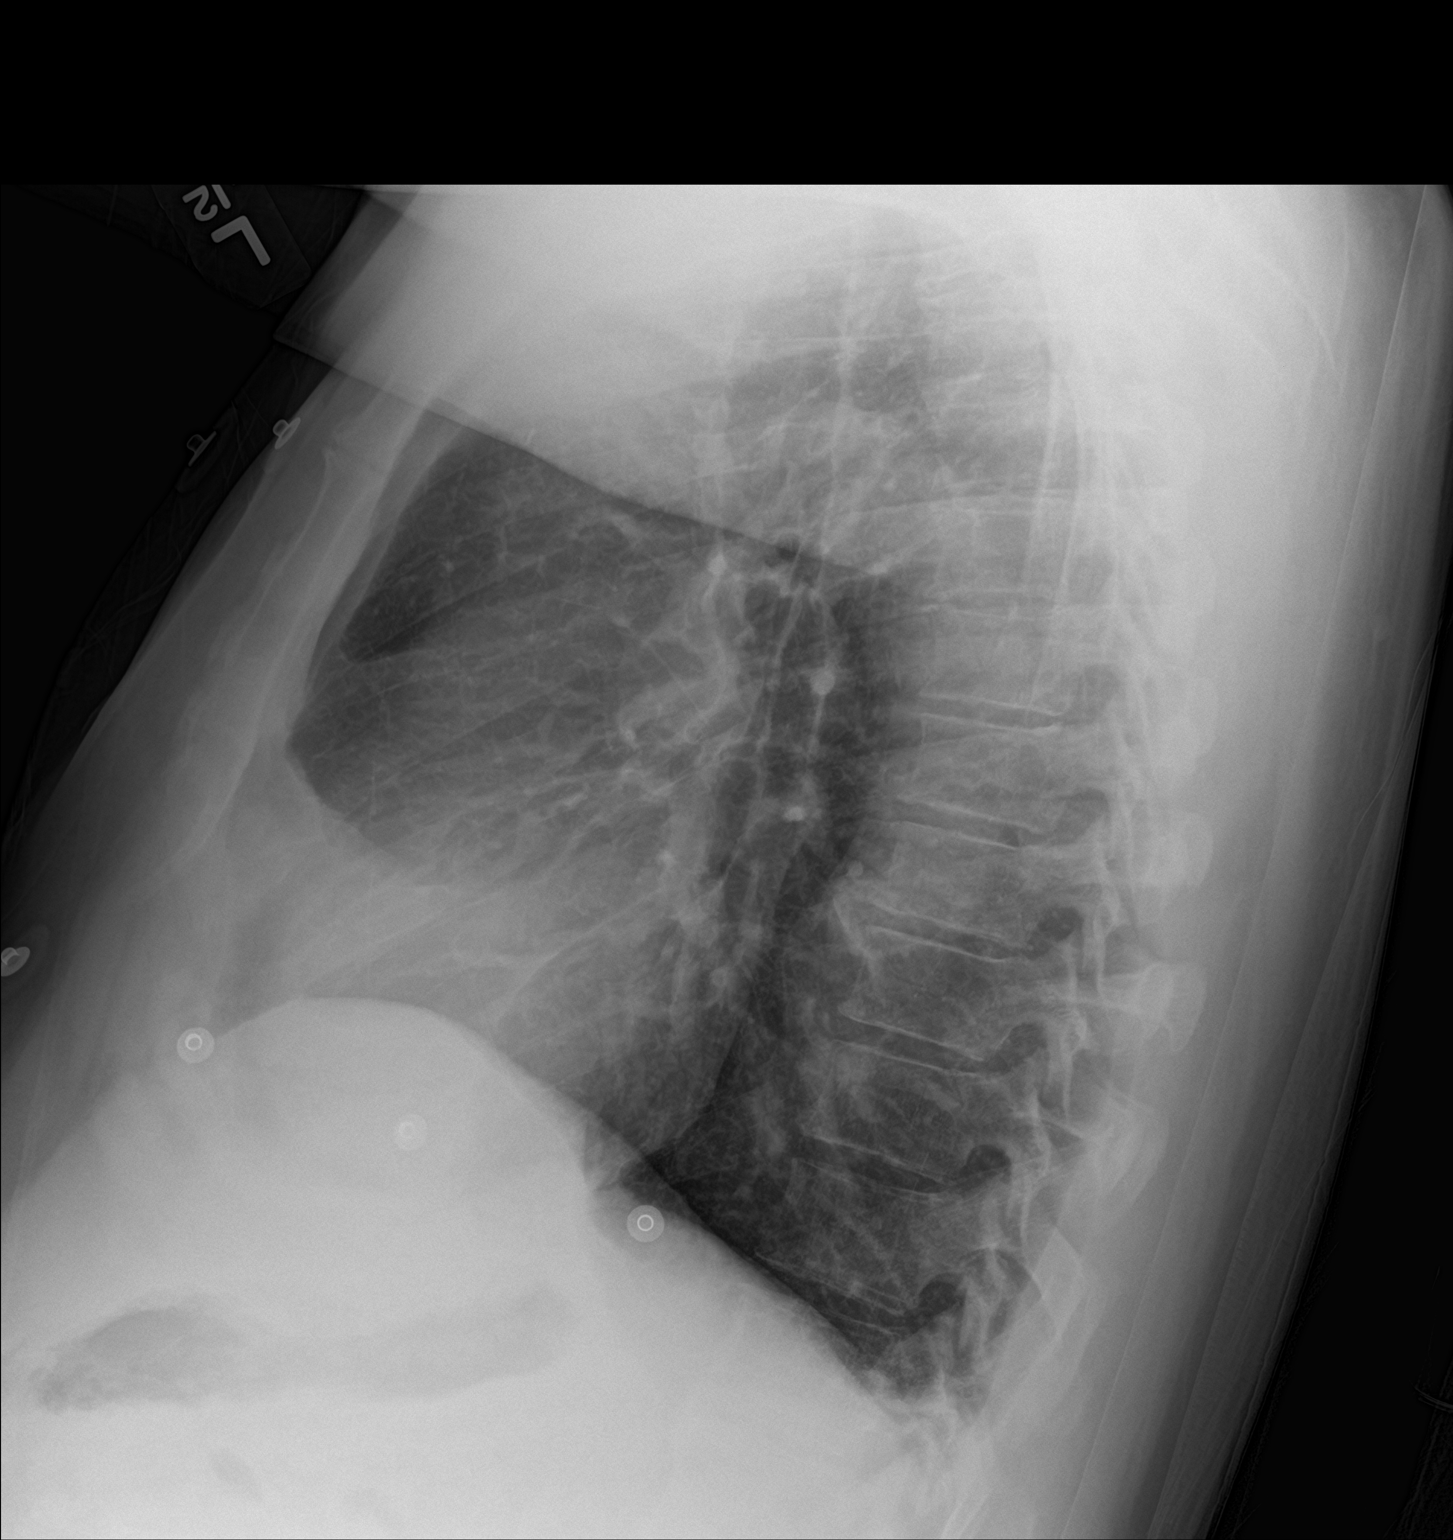

[2 of 2 positions shown; findings below may reference images not displayed]

FINDINGS: There is patchy infiltrate in the left base. The lungs elsewhere are
clear. There is a presumed nipple shadow on the right. Heart size
and pulmonary vascularity are normal. No adenopathy. There is mild
degenerative change in the thoracic spine.
IMPRESSION: Patchy infiltrate left lower lobe. Probable nipple shadow on the
right; repeat study with nipple markers could confirm. Stable
cardiac silhouette.

Followup PA and lateral chest radiographs recommended in 3-4 weeks
following trial of antibiotic therapy to ensure resolution and
exclude underlying malignancy.

## 2017-05-30 IMAGING — CT CT CHEST W/ CM
1 of 2 series · 14 of 30 positions shown, 18 images · IV contrast (iopamidol)
Comparison: 05/27/2016 and prior chest radiographs.

CLINICAL DATA: 54-year-old male with acute shortness of breath for
1 day. Weight loss. Abnormal chest x-ray with possible right lung
nodule and left lung airspace disease.

EXAM:
CT CHEST WITH CONTRAST
TECHNIQUE: Multidetector CT imaging of the chest was performed during
intravenous contrast administration.
CONTRAST:  75mL DOSN46-LVV IOPAMIDOL (DOSN46-LVV) INJECTION 61%

[Series 3: rtn chest with st · axial · 0.78mm/px · z∈[-273,+5]mm · 14 of 163 slices shown, 18 images]
[im 12/163  mediastinal]
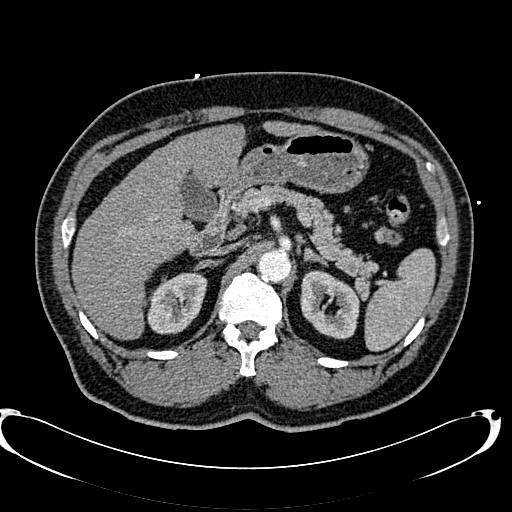
[im 12/163  lung]
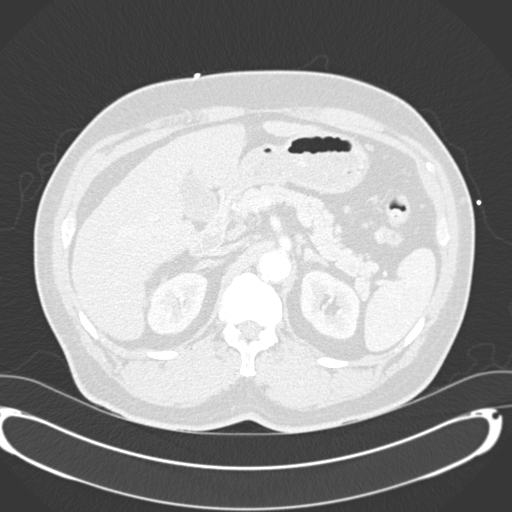
[im 24/163  lung]
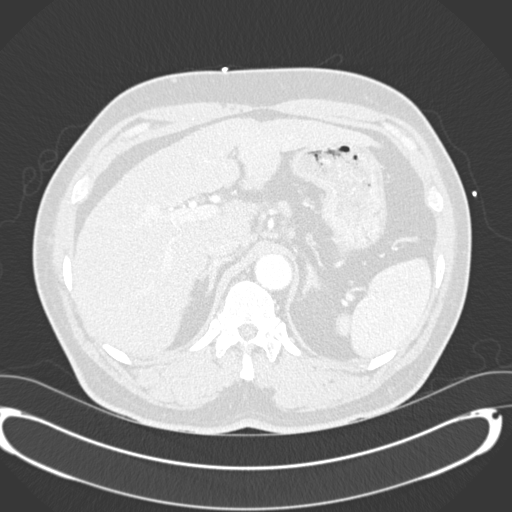
[im 35/163  lung]
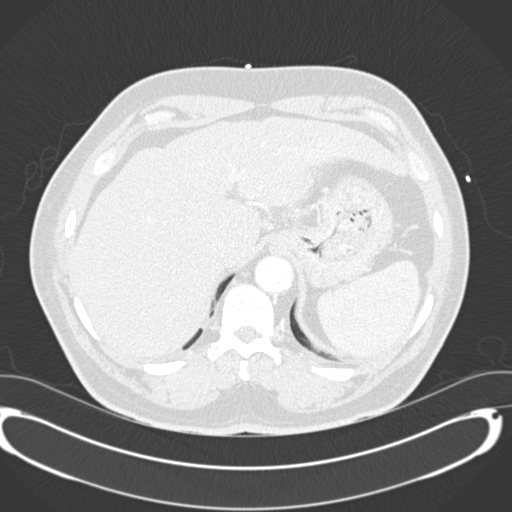
[im 47/163  lung]
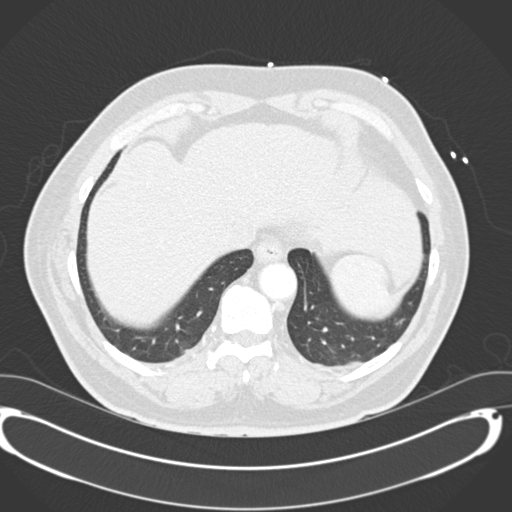
[im 58/163  mediastinal]
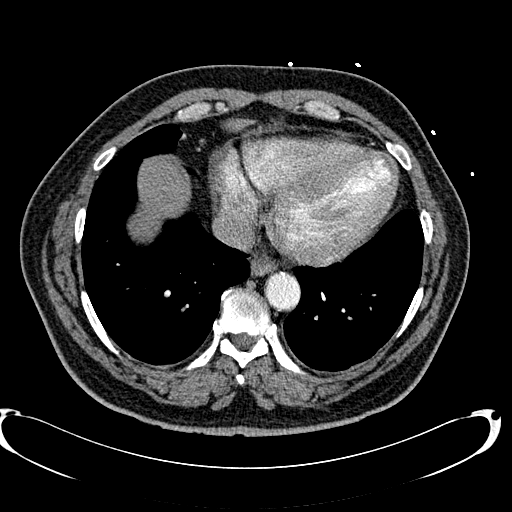
[im 58/163  lung]
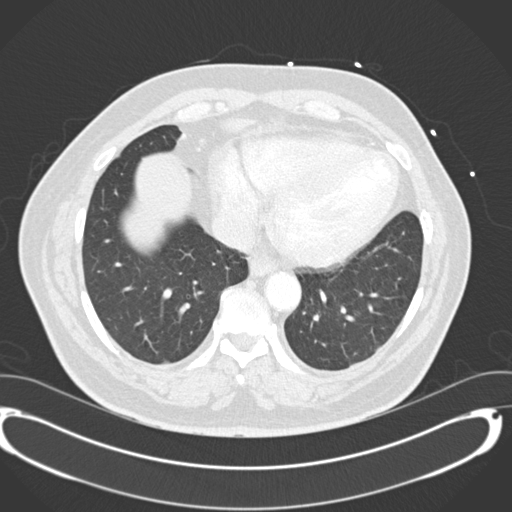
[im 70/163  lung]
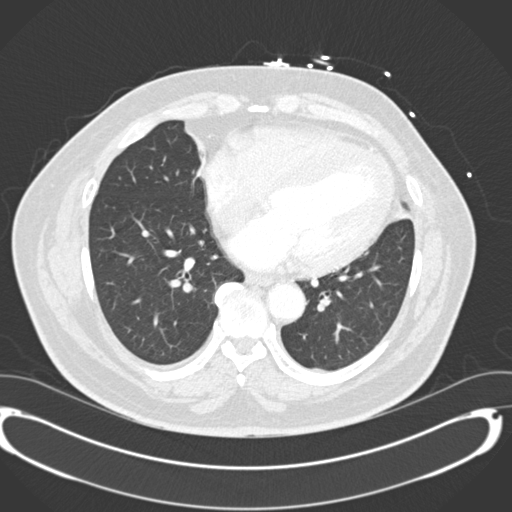
[im 77/163  lung]
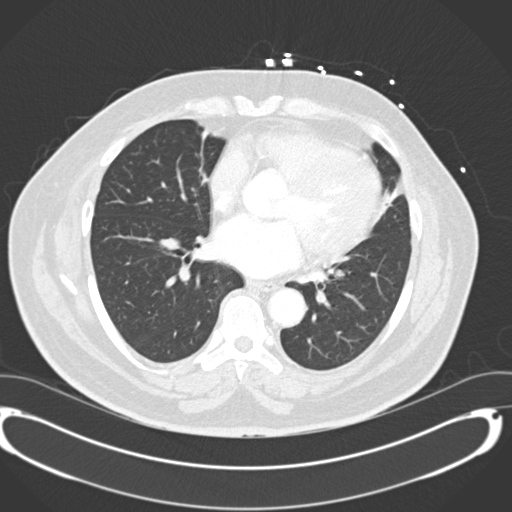
[im 82/163  lung]
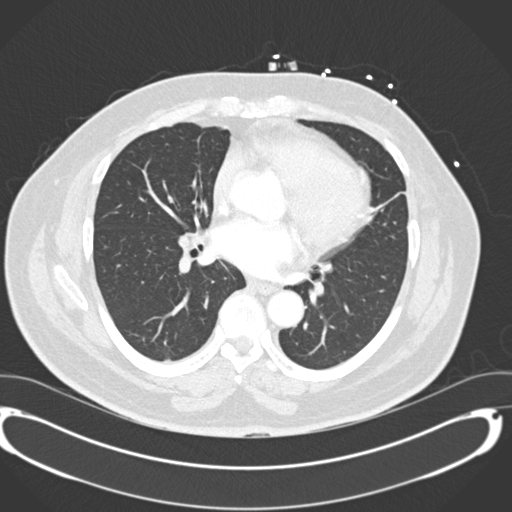
[im 93/163  mediastinal]
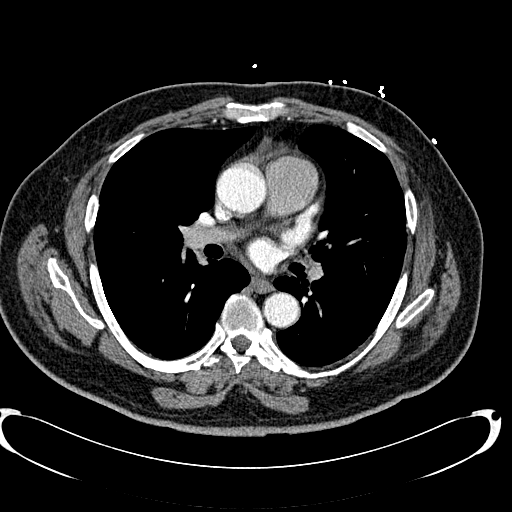
[im 93/163  lung]
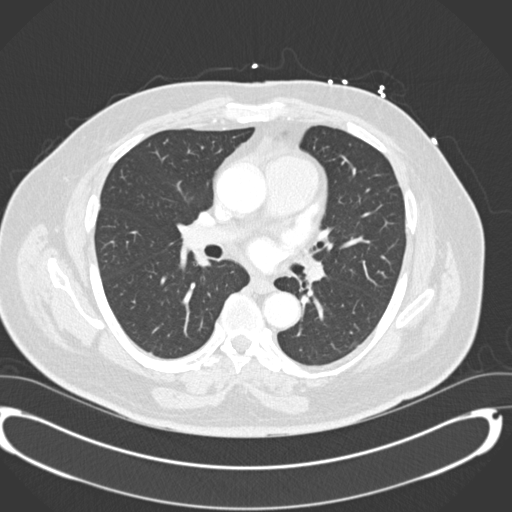
[im 105/163  lung]
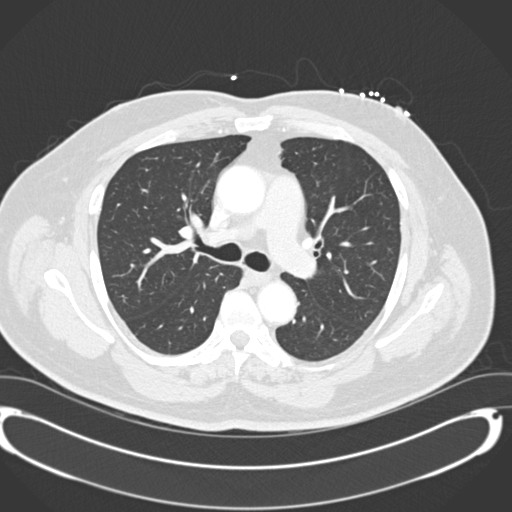
[im 116/163  lung]
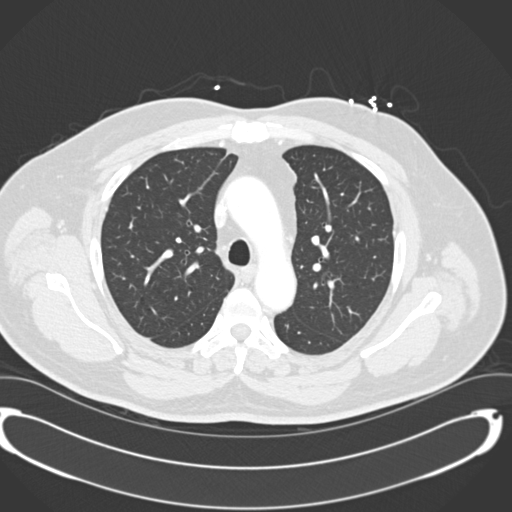
[im 128/163  lung]
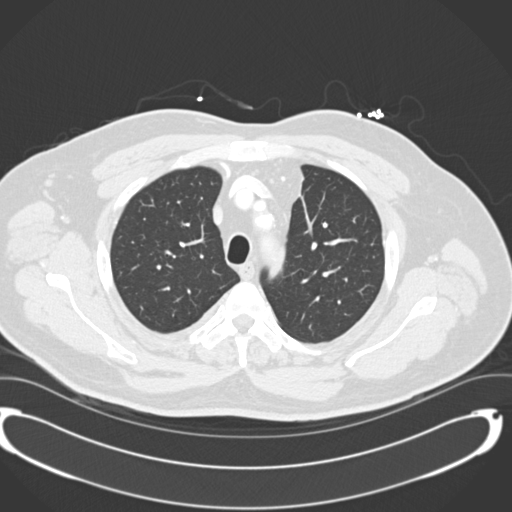
[im 139/163  mediastinal]
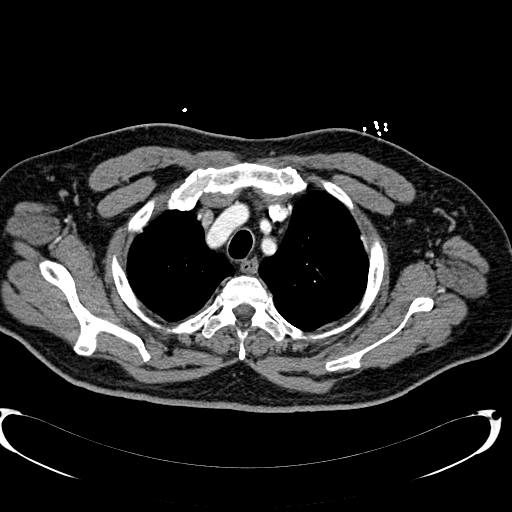
[im 139/163  lung]
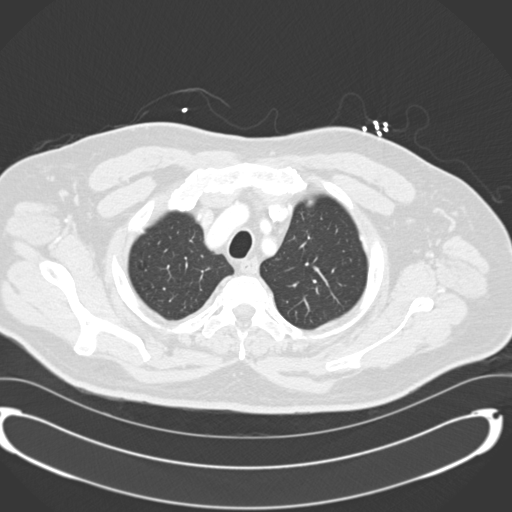
[im 151/163  lung]
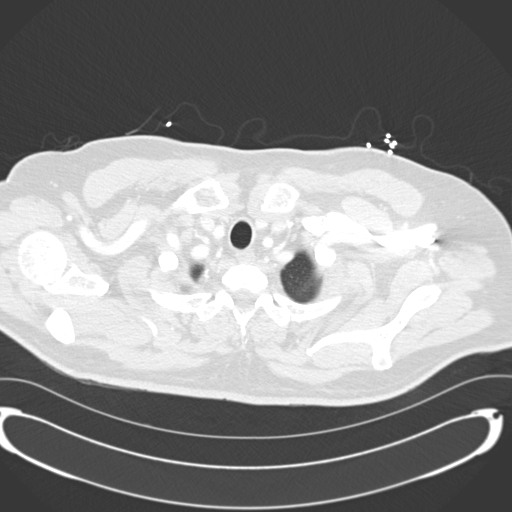

[14 of 30 positions shown; findings below may reference images not displayed]

FINDINGS: Mediastinum/Nodes: Cardiomegaly and moderate coronary artery
calcifications noted. There is no evidence of thoracic aortic
aneurysm, mediastinal fullness, pericardial effusion or enlarged
lymph nodes.

Lungs/Pleura: There is no evidence of nodule, mass, airspace disease
or consolidation. Subsegmental atelectasis/ scarring in the right
middle lobe and lingular identified. No pleural effusion or
pneumothorax identified. No endobronchial or endotracheal lesions
are identified.

Upper abdomen: Cirrhosis identified.

Musculoskeletal: No acute or suspicious abnormality.
IMPRESSION: No evidence of pulmonary nodule, mass or airspace disease.
Subsegmental atelectasis/scarring in the right middle lobe and
lingula.

Cirrhosis.

Cardiomegaly and coronary artery disease.

## 2017-05-31 IMAGING — MR MR HEAD W/O CM
8 of 11 series · 35 of 48 positions shown · non-contrast
Comparison: Head CT without contrast 11/15/2011.

CLINICAL DATA: 54-year-old male with blurred vision, shortness of
breath. Initial encounter.

EXAM:
MRI HEAD WITHOUT CONTRAST
TECHNIQUE: Multiplanar, multiecho pulse sequences of the brain and surrounding
structures were obtained without intravenous contrast.

[Series 4: DWI · axial · 3.0mm · 1.09mm/px · z∈[-77,+72]mm · 8 of 104 slices shown (1 of 4)]
[im 1/104]
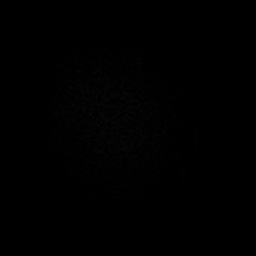
[im 12/104]
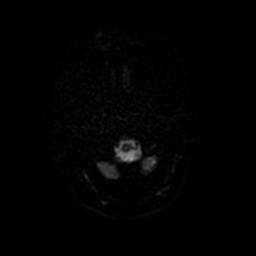
[im 35/104]
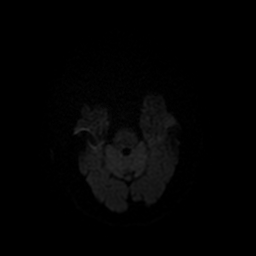
[im 46/104]
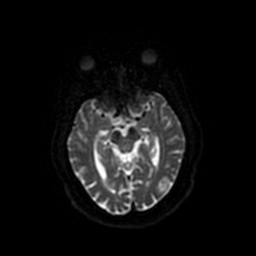
[im 58/104]
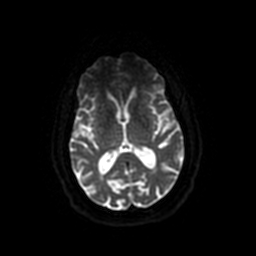
[im 69/104]
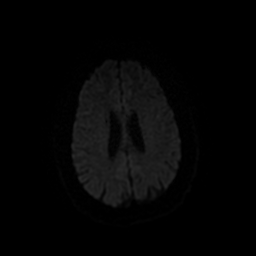
[im 92/104]
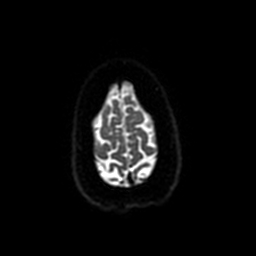
[im 104/104]
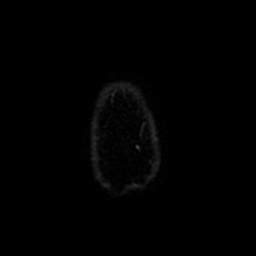

[Series 5: DWI · coronal · 5.0mm · 1.09mm/px · 7 of 72 slices shown (2 of 4)]
[im 1/72]
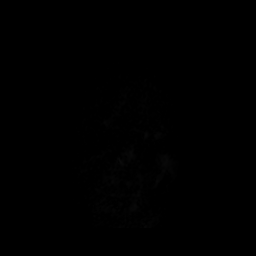
[im 12/72]
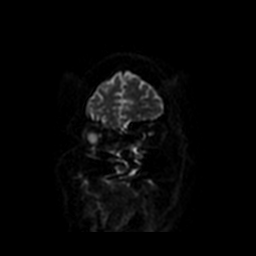
[im 24/72]
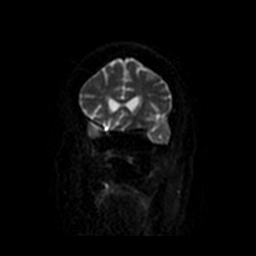
[im 36/72]
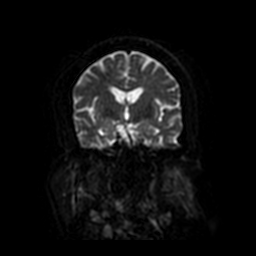
[im 48/72]
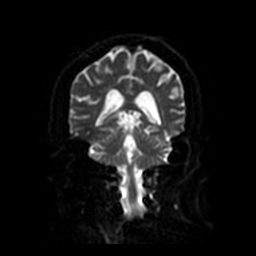
[im 60/72]
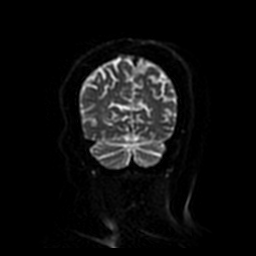
[im 72/72]
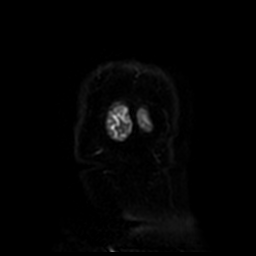

[Series 6: T2 · axial · 5.0mm · 0.43mm/px · z∈[-84,+80]mm · 3 of 27 slices shown (1 of 3)]
[im 1/27]
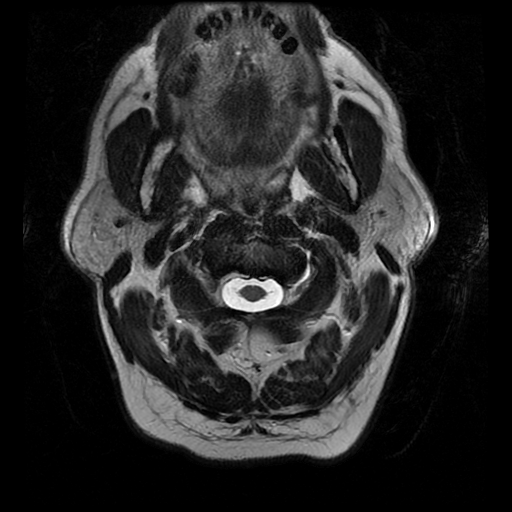
[im 14/27]
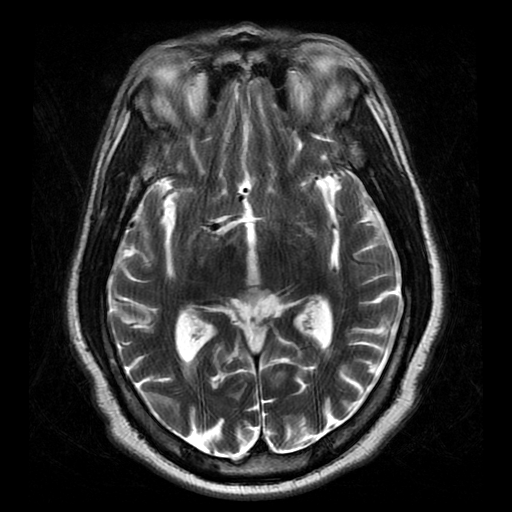
[im 27/27]
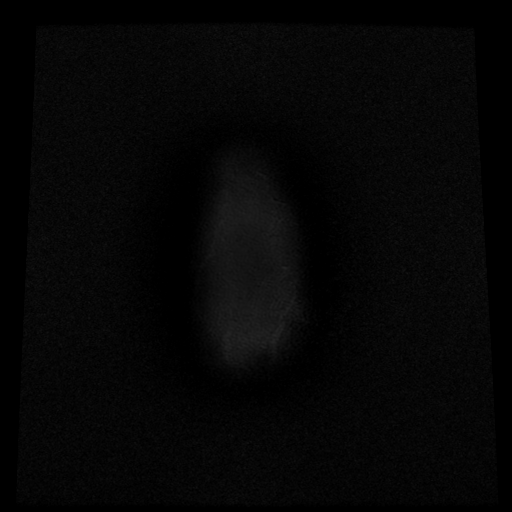

[Series 7: FLAIR · axial · 5.0mm · 0.43mm/px · z∈[-86,+83]mm · 3 of 30 slices shown]
[im 1/30]
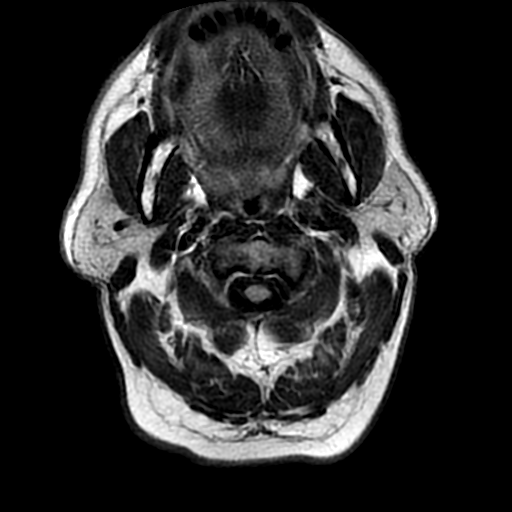
[im 15/30]
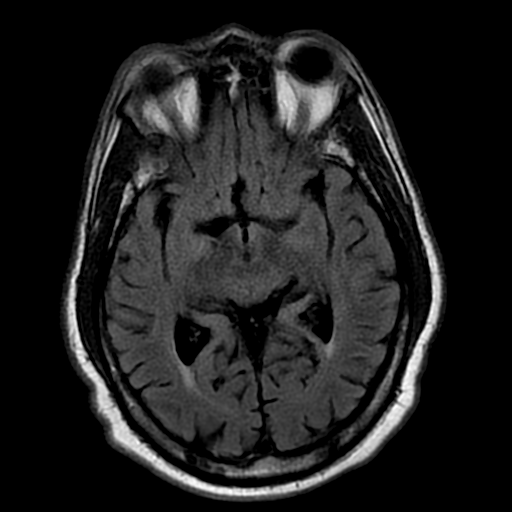
[im 30/30]
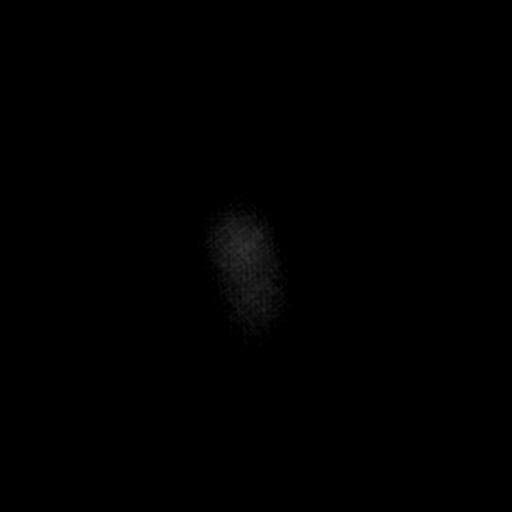

[Series 10: T2 · coronal · 5.0mm · 0.45mm/px · 3 of 27 slices shown (2 of 3)]
[im 1/27]
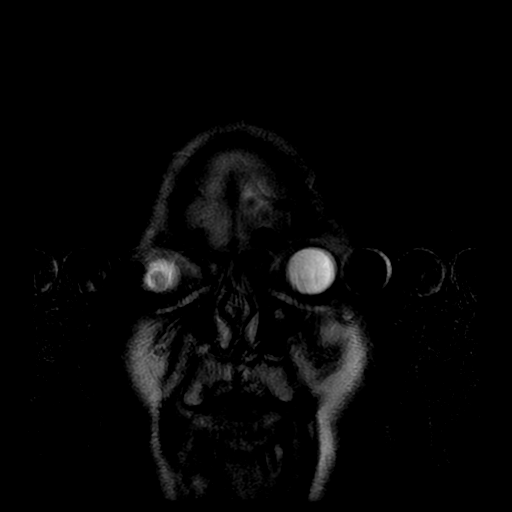
[im 14/27]
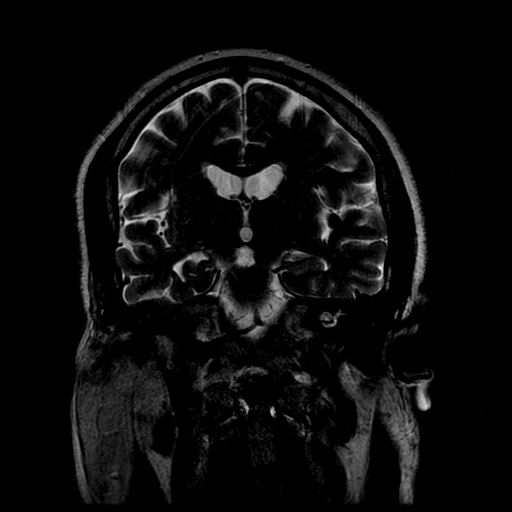
[im 27/27]
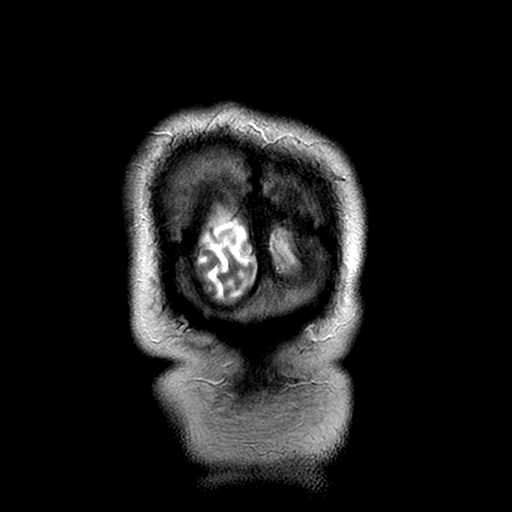

[Series 11: T2 · axial · 5.0mm · 0.43mm/px · z∈[-86,-5]mm · 2 of 30 slices shown (3 of 3)]
[im 1/30]
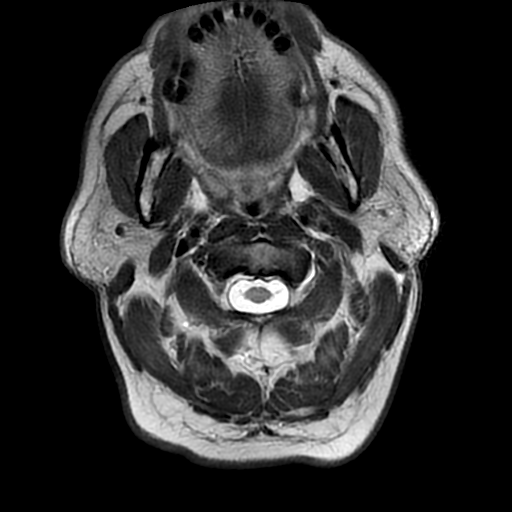
[im 15/30]
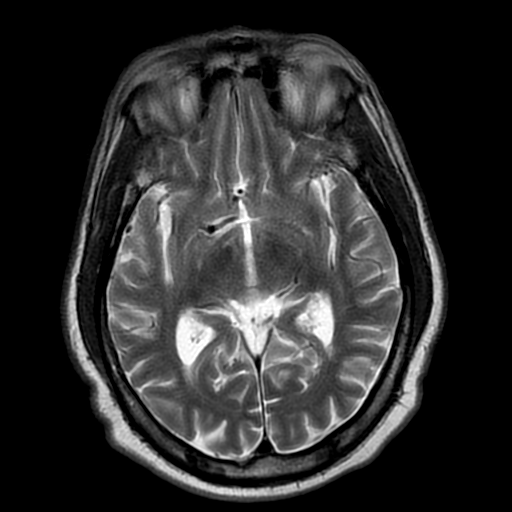

[Series 400: DWI · axial · 3.0mm · 1.09mm/px · z∈[-77,+72]mm · 5 of 52 slices shown (3 of 4)]
[im 1/52]
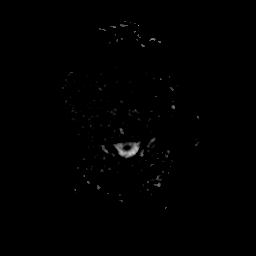
[im 13/52]
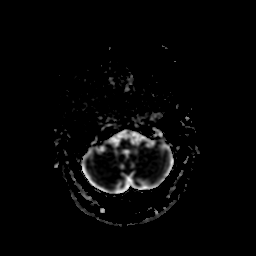
[im 26/52]
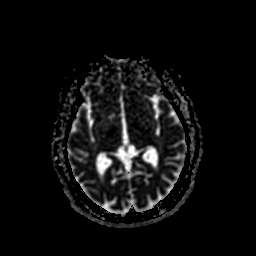
[im 39/52]
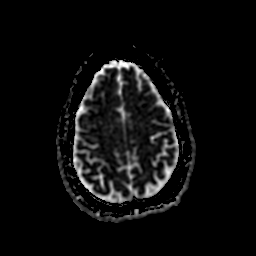
[im 52/52]
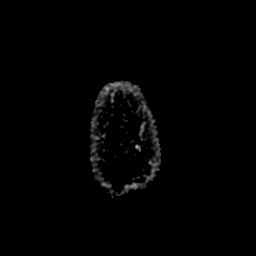

[Series 500: DWI · coronal · 5.0mm · 1.09mm/px · 4 of 36 slices shown (4 of 4)]
[im 1/36]
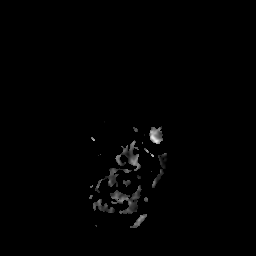
[im 12/36]
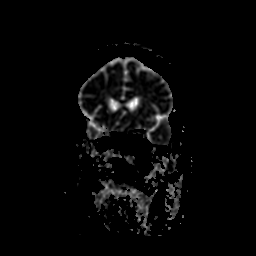
[im 24/36]
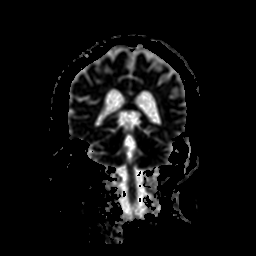
[im 36/36]
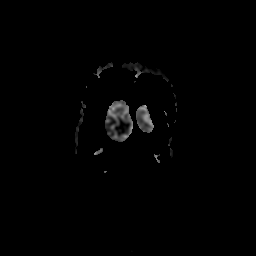

[35 of 48 positions shown; findings below may reference images not displayed]

FINDINGS: No restricted diffusion to suggest acute infarction. No midline
shift, mass effect, evidence of mass lesion, ventriculomegaly,
extra-axial collection or acute intracranial hemorrhage.
Cervicomedullary junction and pituitary are within normal limits.
Major intracranial vascular flow voids are preserved with mild
intracranial artery tortuosity.

Mild for age nonspecific periventricular white matter T2 and FLAIR
hyperintensity. No cortical encephalomalacia or chronic cerebral
blood products. Mild T2 heterogeneity in the thalami, more so the
right, which may reflect chronic small vessel disease. Negative
brainstem and cerebellum. Visible internal auditory structures
appear normal. Trace mastoid fluid. Orbits soft tissues appear
normal. Optic chiasm appears within normal limits, no suprasellar
region mass effect aside from some distal right ICA dolichoectasia.

Negative scalp soft tissues. Negative visualized cervical spine.
Normal bone marrow signal.
IMPRESSION: 1. No acute intracranial abnormality. No explanation for acute
visual changes.
2. Mild for age nonspecific signal changes most commonly due to
chronic small vessel disease.
3. Intracranial artery tortuosity.  Query chronic hypertension.

## 2017-06-08 ENCOUNTER — Ambulatory Visit: Payer: Self-pay | Admitting: Family Medicine

## 2017-06-16 ENCOUNTER — Other Ambulatory Visit: Payer: Self-pay | Admitting: Family Medicine

## 2017-06-16 DIAGNOSIS — R21 Rash and other nonspecific skin eruption: Secondary | ICD-10-CM

## 2017-06-29 IMAGING — CT CT ABD-PELV W/O CM
2 of 4 series · 15 of 46 positions shown, 17 images · non-contrast
Comparison: Prior ultrasound from earlier the same day.

CLINICAL DATA: Initial evaluation for acute nausea a, right upper
quadrant pain.

EXAM:
CT ABDOMEN AND PELVIS WITHOUT CONTRAST
TECHNIQUE: Multidetector CT imaging of the abdomen and pelvis was performed
following the standard protocol without IV contrast.

[Series 2: a/p w/o 5mm · axial · non-contrast · 0.85mm/px · z∈[-517,-32]mm · 12 of 107 slices shown, 14 images]
[im 5/107  soft-tissue]
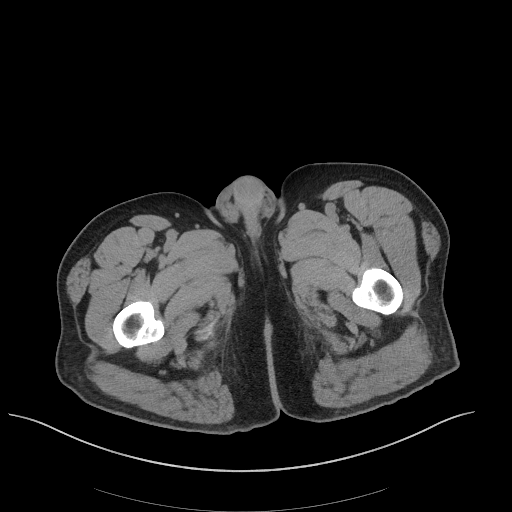
[im 5/107  bone]
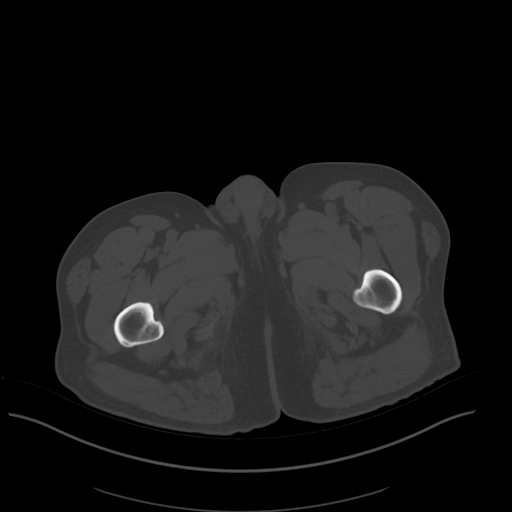
[im 14/107  soft-tissue]
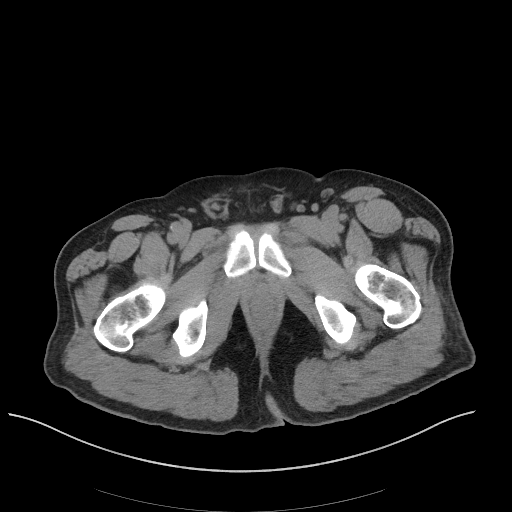
[im 23/107  soft-tissue]
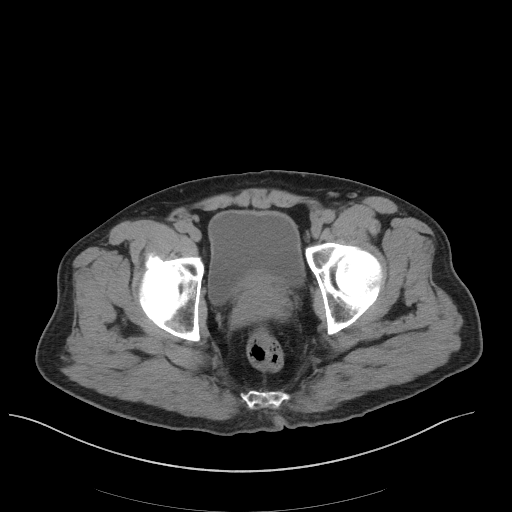
[im 31/107  soft-tissue]
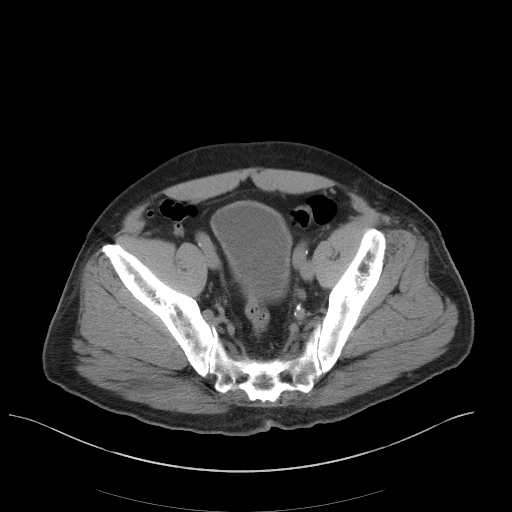
[im 40/107  soft-tissue]
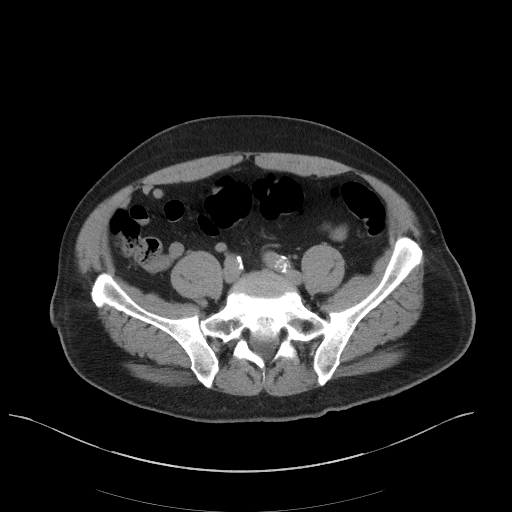
[im 49/107  soft-tissue]
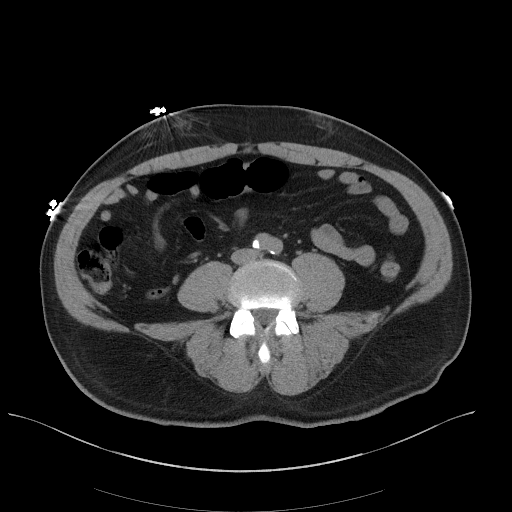
[im 58/107  soft-tissue]
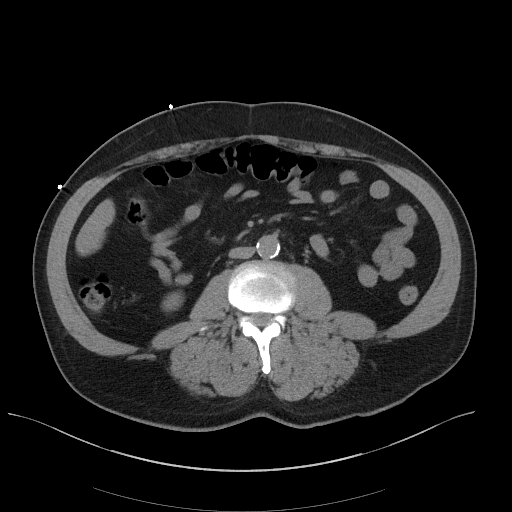
[im 67/107  soft-tissue]
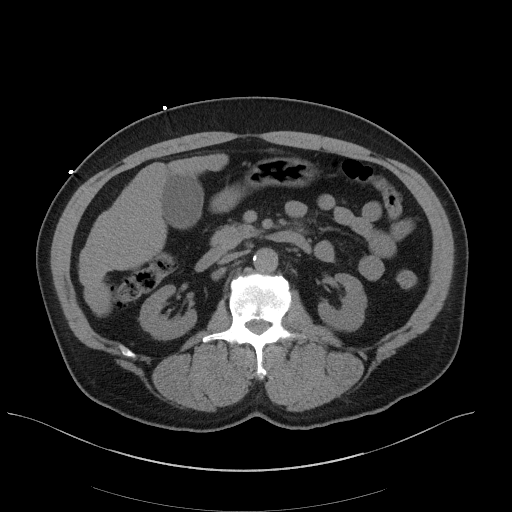
[im 76/107  soft-tissue]
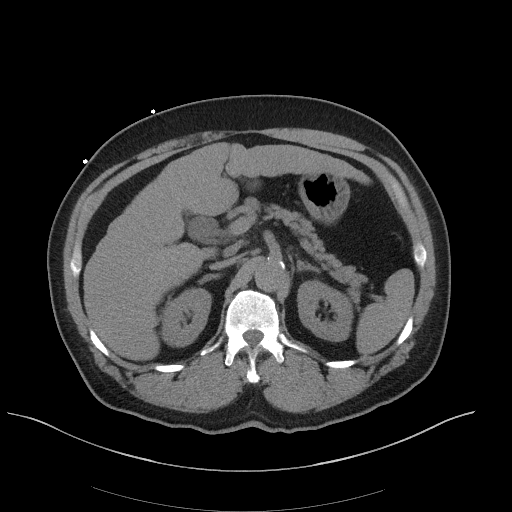
[im 76/107  bone]
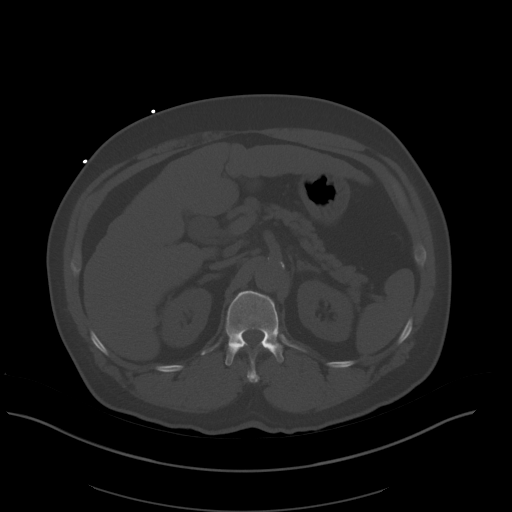
[im 84/107  soft-tissue]
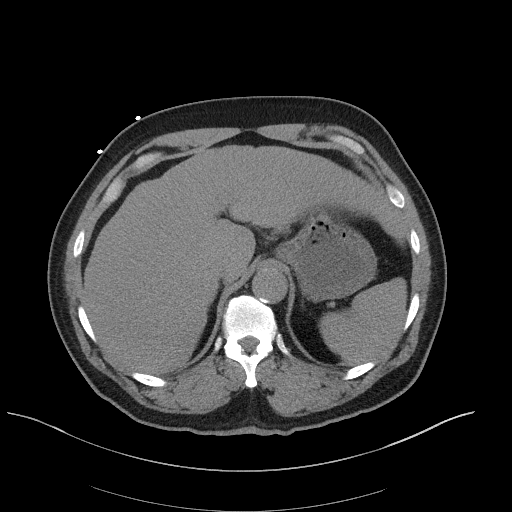
[im 93/107  soft-tissue]
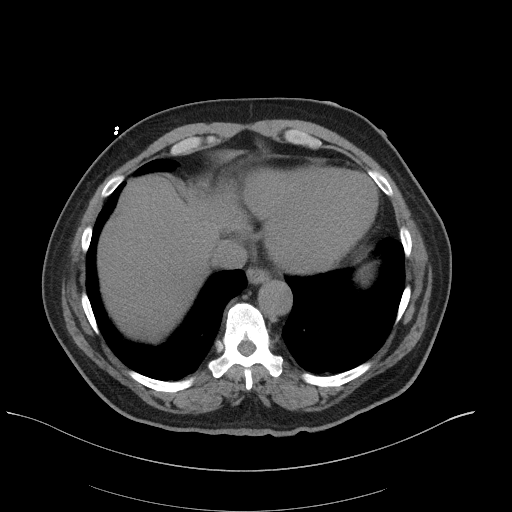
[im 102/107  soft-tissue]
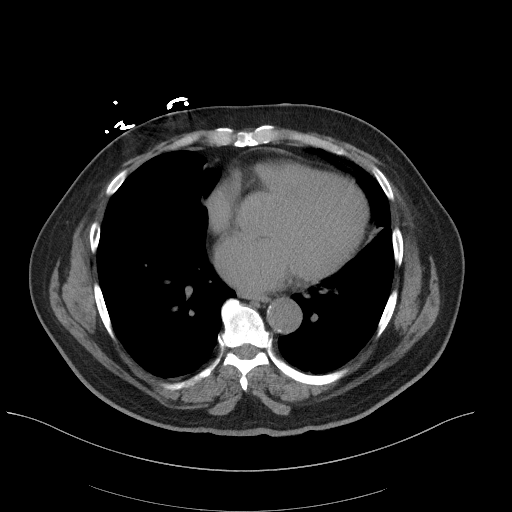

[Series 4: a/p w/o cor · coronal · non-contrast · 0.91mm/px · 3 of 132 slices shown]
[im 44/132  soft-tissue]
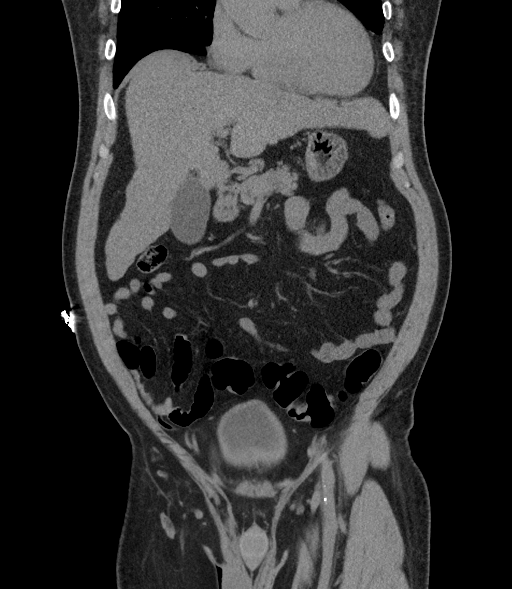
[im 59/132  soft-tissue]
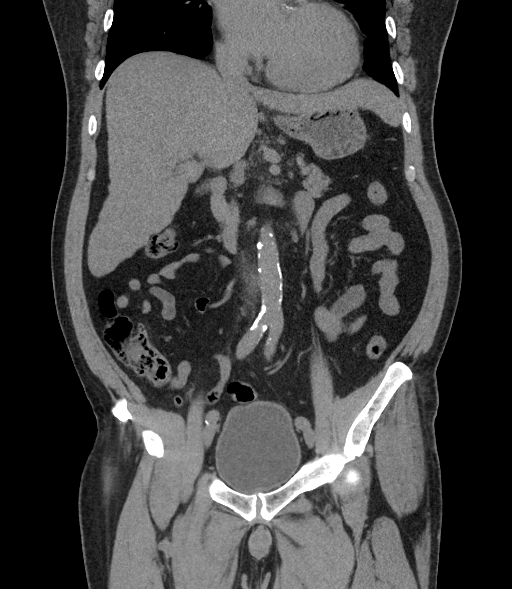
[im 73/132  soft-tissue]
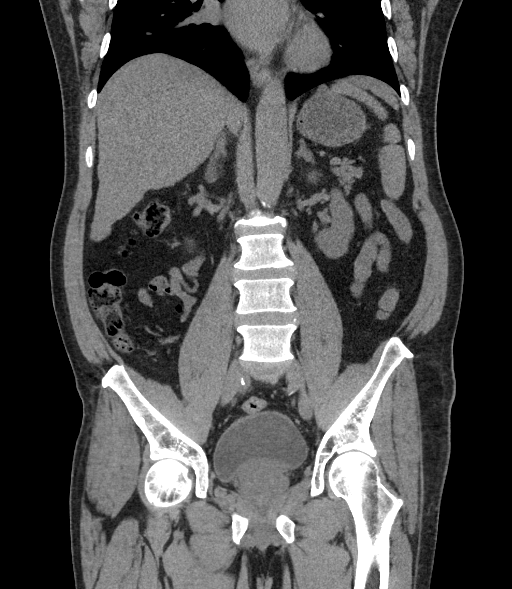

[15 of 46 positions shown; findings below may reference images not displayed]

FINDINGS: Minimal subsegmental atelectasis seen dependently within the
visualized lung bases. Visualized lungs are otherwise clear.
Coronary artery calcifications are partially visualized. Trace
pericardial fluid noted. No pleural effusion.

Liver demonstrates a nodular contour, compatible with cirrhosis.
Evaluation for focal intrahepatic mass limited on this noncontrast
examination. Possible gastrohepatic varices noted.

Gallbladder within normal limits. No biliary dilatation. Spleen
within normal limits for size and appearance. Adrenal glands and
pancreas demonstrate a normal unenhanced appearance.

Kidneys are equal in size without evidence of nephrolithiasis or
hydronephrosis. Subcentimeter hypodense lesion within the left
kidney too small the characterize on this exam, but may reflect a
small cyst. No radiopaque calculi seen along the course of either
renal collecting system. There is no hydroureter.

Stomach within normal limits. No evidence for bowel obstruction. No
abnormal wall thickening or inflammatory fat stranding seen about
the bowels. Appendix well visualized in the right lower quadrant and
is of normal caliber and appearance associated inflammatory changes
to suggest acute appendicitis.

Mild circumferential bladder wall thickening, suspected to be
related incomplete distension and/or chronic outlet obstruction.
Prostate is enlarged with median lobe hypertrophy. Prostate measures
5.7 cm in transverse diameter.

No free air or fluid. No ascites. Few mildly prominent porta hepatis
nodes measure up to 9 mm, likely related underlying intrinsic liver
disease. No pathologically enlarged intra-abdominal or pelvic lymph
nodes identified. Scattered aorto bi-iliac atherosclerotic
calcifications, moderate in nature. Aorta measures up to 3.1 cm in
greatest diameter just above the takeoff of the celiac axis.

Scattered soft tissue density within the subcutaneous fat of the
anterior abdomen, likely related to injection sites.

No acute osseous abnormality. No worrisome lytic or blastic osseous
lesions.
IMPRESSION: 1. No acute intra-abdominal or pelvic process identified.
2. Nodular contour of the liver, consistent with cirrhosis.
3. Enlarged prostate. Mild circumferential bladder wall thickening
suspected to be related to chronic outlet obstruction.
4. Coronary artery calcifications with moderate aorto bi-iliac
atherosclerotic disease. Intra-abdominal aorta measures up to 3.1 cm
in diameter. Recommend followup by ultrasound in 3 years. This
recommendation follows ACR consensus guidelines: White Paper of the
ACR Incidental Findings Committee II on Vascular Findings. [HOSPITAL] 8860; [DATE]

## 2017-06-29 IMAGING — CR DG ABDOMEN ACUTE W/ 1V CHEST
3 series · 3 of 3 positions shown · non-contrast
Comparison: Chest radiograph and CTA of the chest performed
05/27/2016, and abdominal ultrasound performed 04/26/2012

CLINICAL DATA: Acute onset of generalized abdominal pain and
nausea. Initial encounter.

EXAM:
DG ABDOMEN ACUTE W/ 1V CHEST

[chest pa]
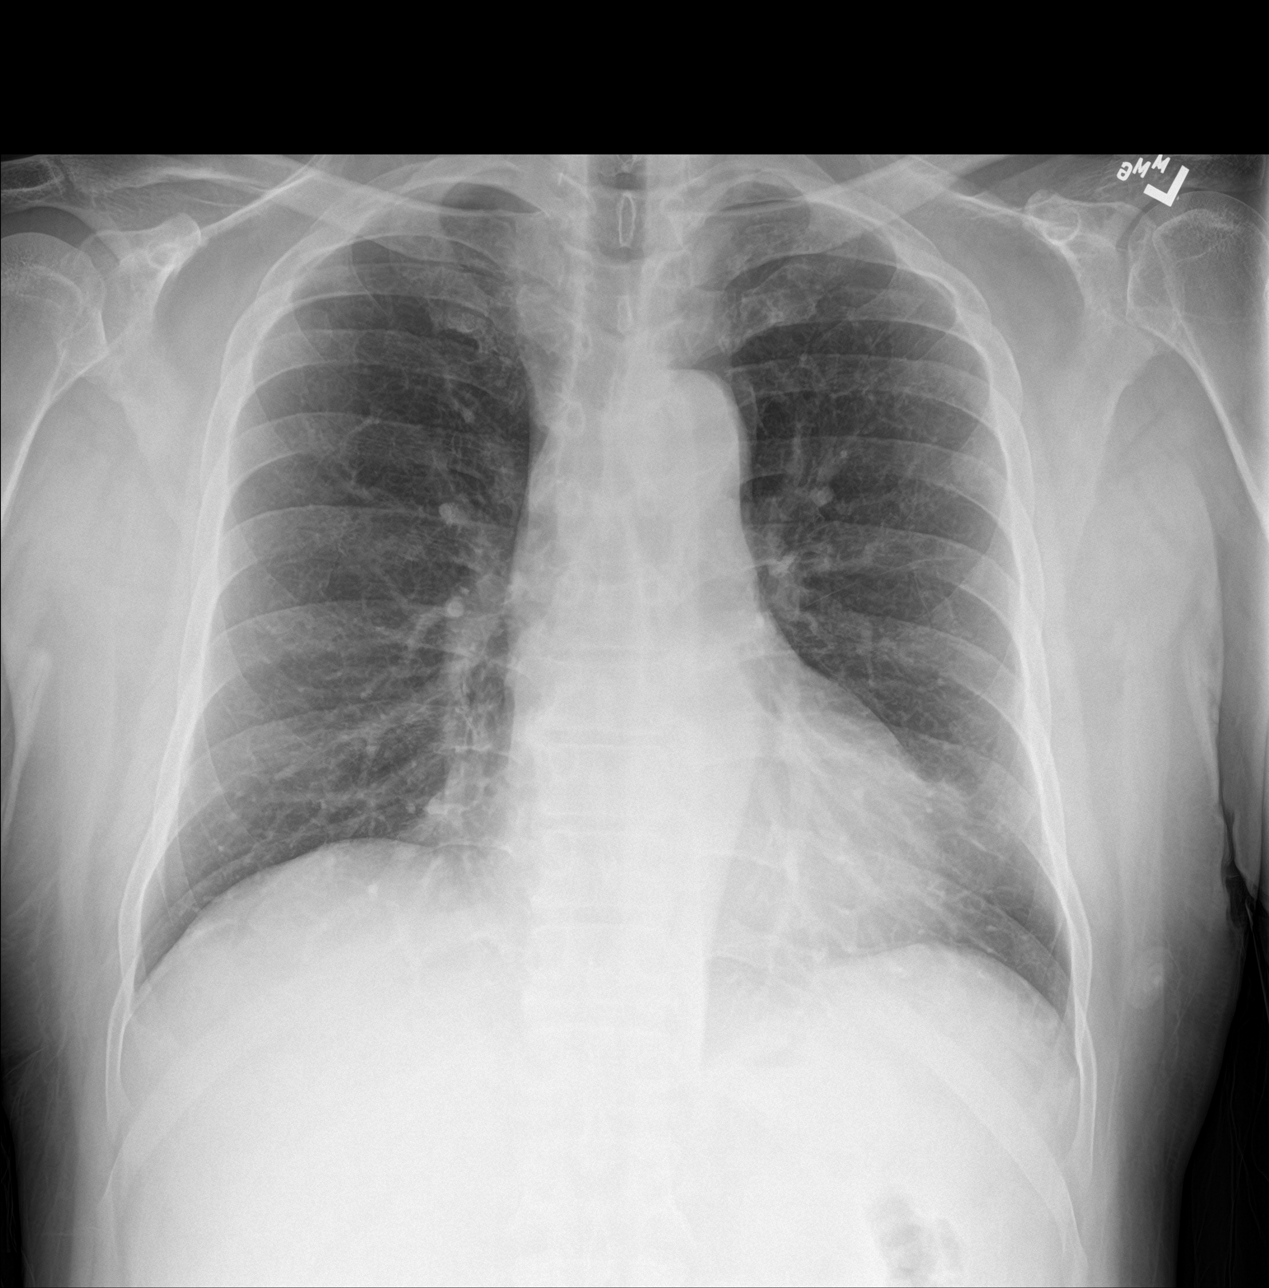

[abdomen erect]
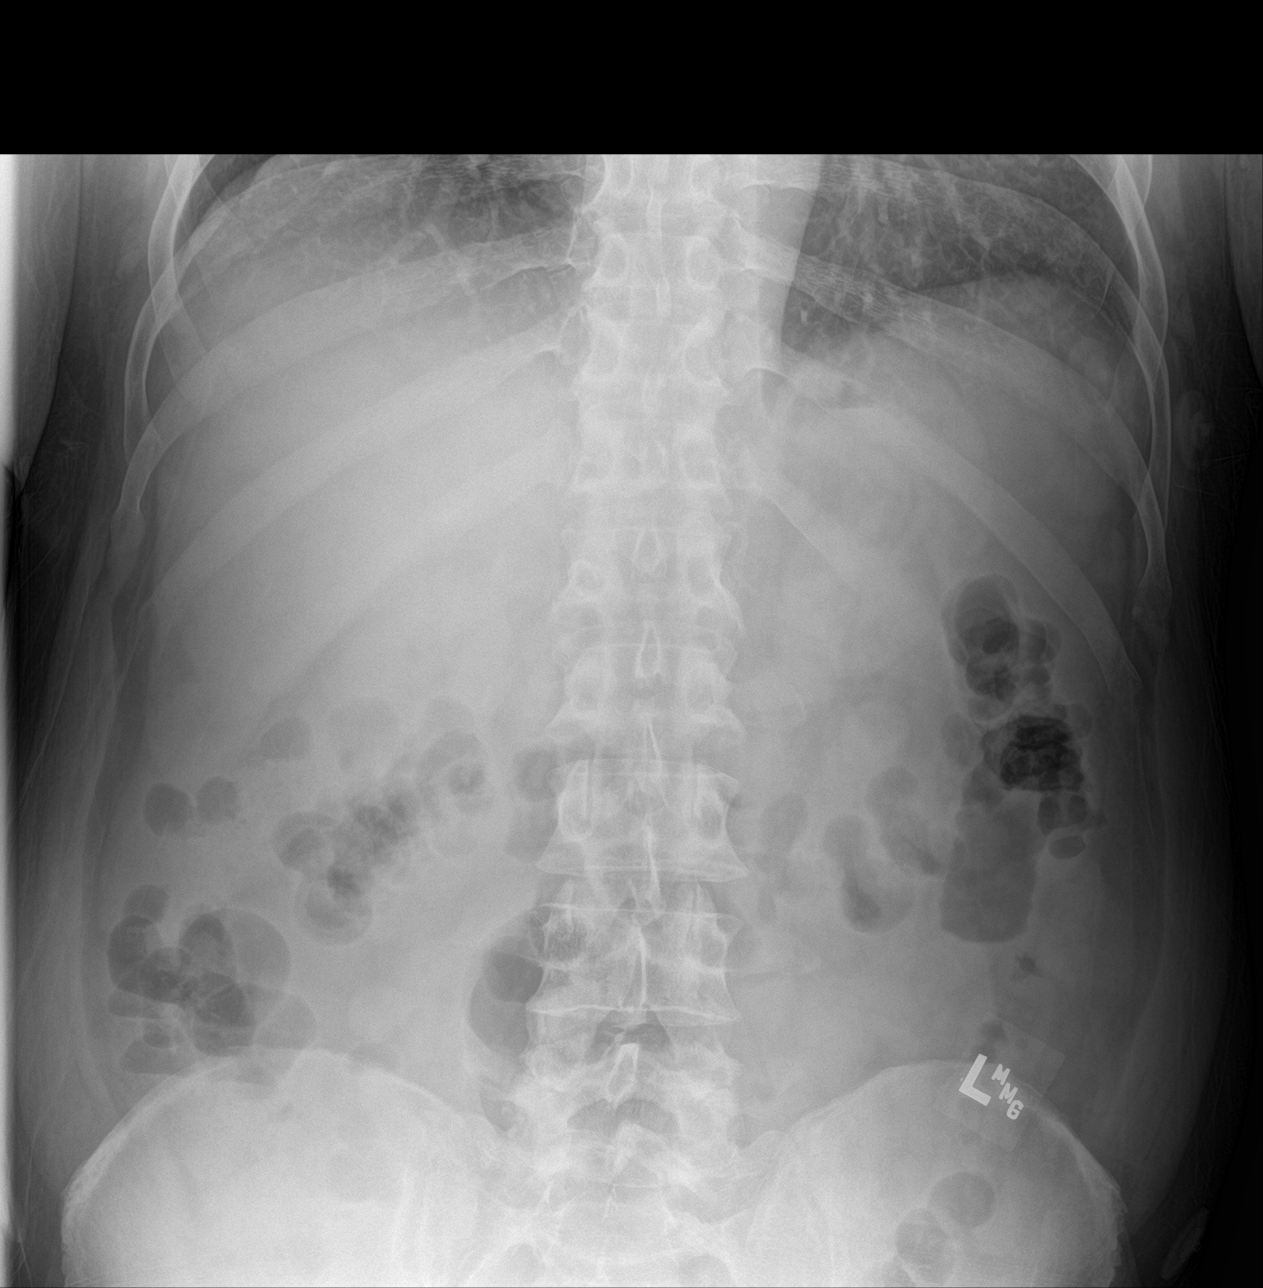

[abdomen supine]
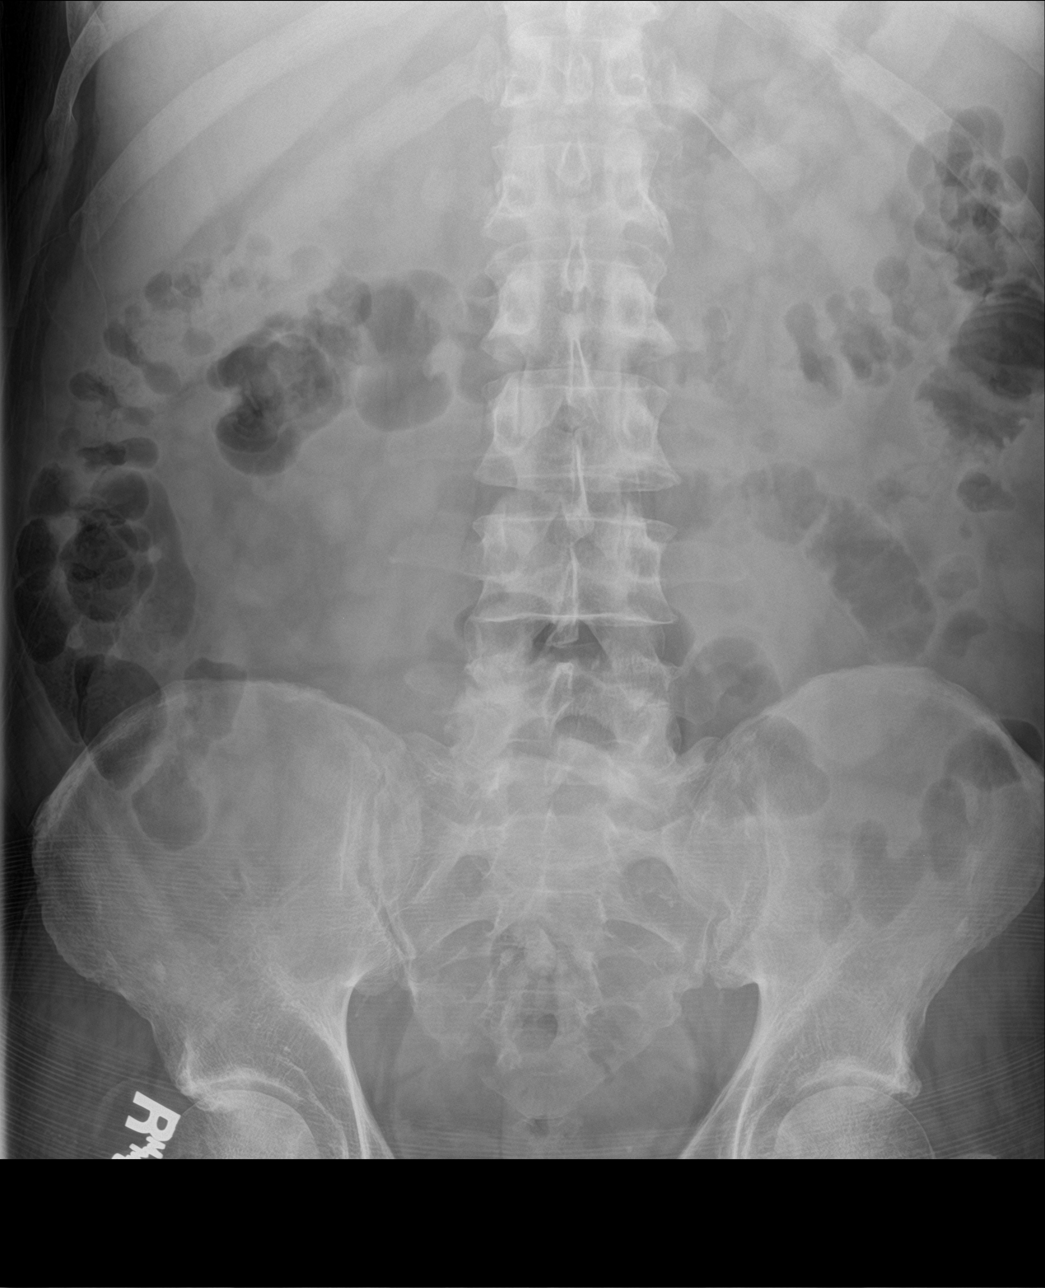

[3 of 3 positions shown; findings below may reference images not displayed]

FINDINGS: The lungs are well-aerated. Pulmonary vascularity is at the upper
limits of normal. There is no evidence of focal opacification,
pleural effusion or pneumothorax. The cardiomediastinal silhouette
is within normal limits.

The visualized bowel gas pattern is unremarkable. Scattered stool
and air are seen within the colon; there is no evidence of small
bowel dilatation to suggest obstruction. No free intra-abdominal air
is identified on the provided upright view.

No acute osseous abnormalities are seen; the sacroiliac joints are
unremarkable in appearance.
IMPRESSION: 1. Unremarkable bowel gas pattern; no free intra-abdominal air seen.
Small amount of stool noted in the colon.
2. No acute cardiopulmonary process seen.

## 2017-07-04 IMAGING — CR DG CHEST 2V
2 series · 2 of 2 positions shown · non-contrast
Comparison: June 26, 2016

CLINICAL DATA: Sweats and left upper extremity pain

EXAM:
CHEST  2 VIEW

[chest pa]
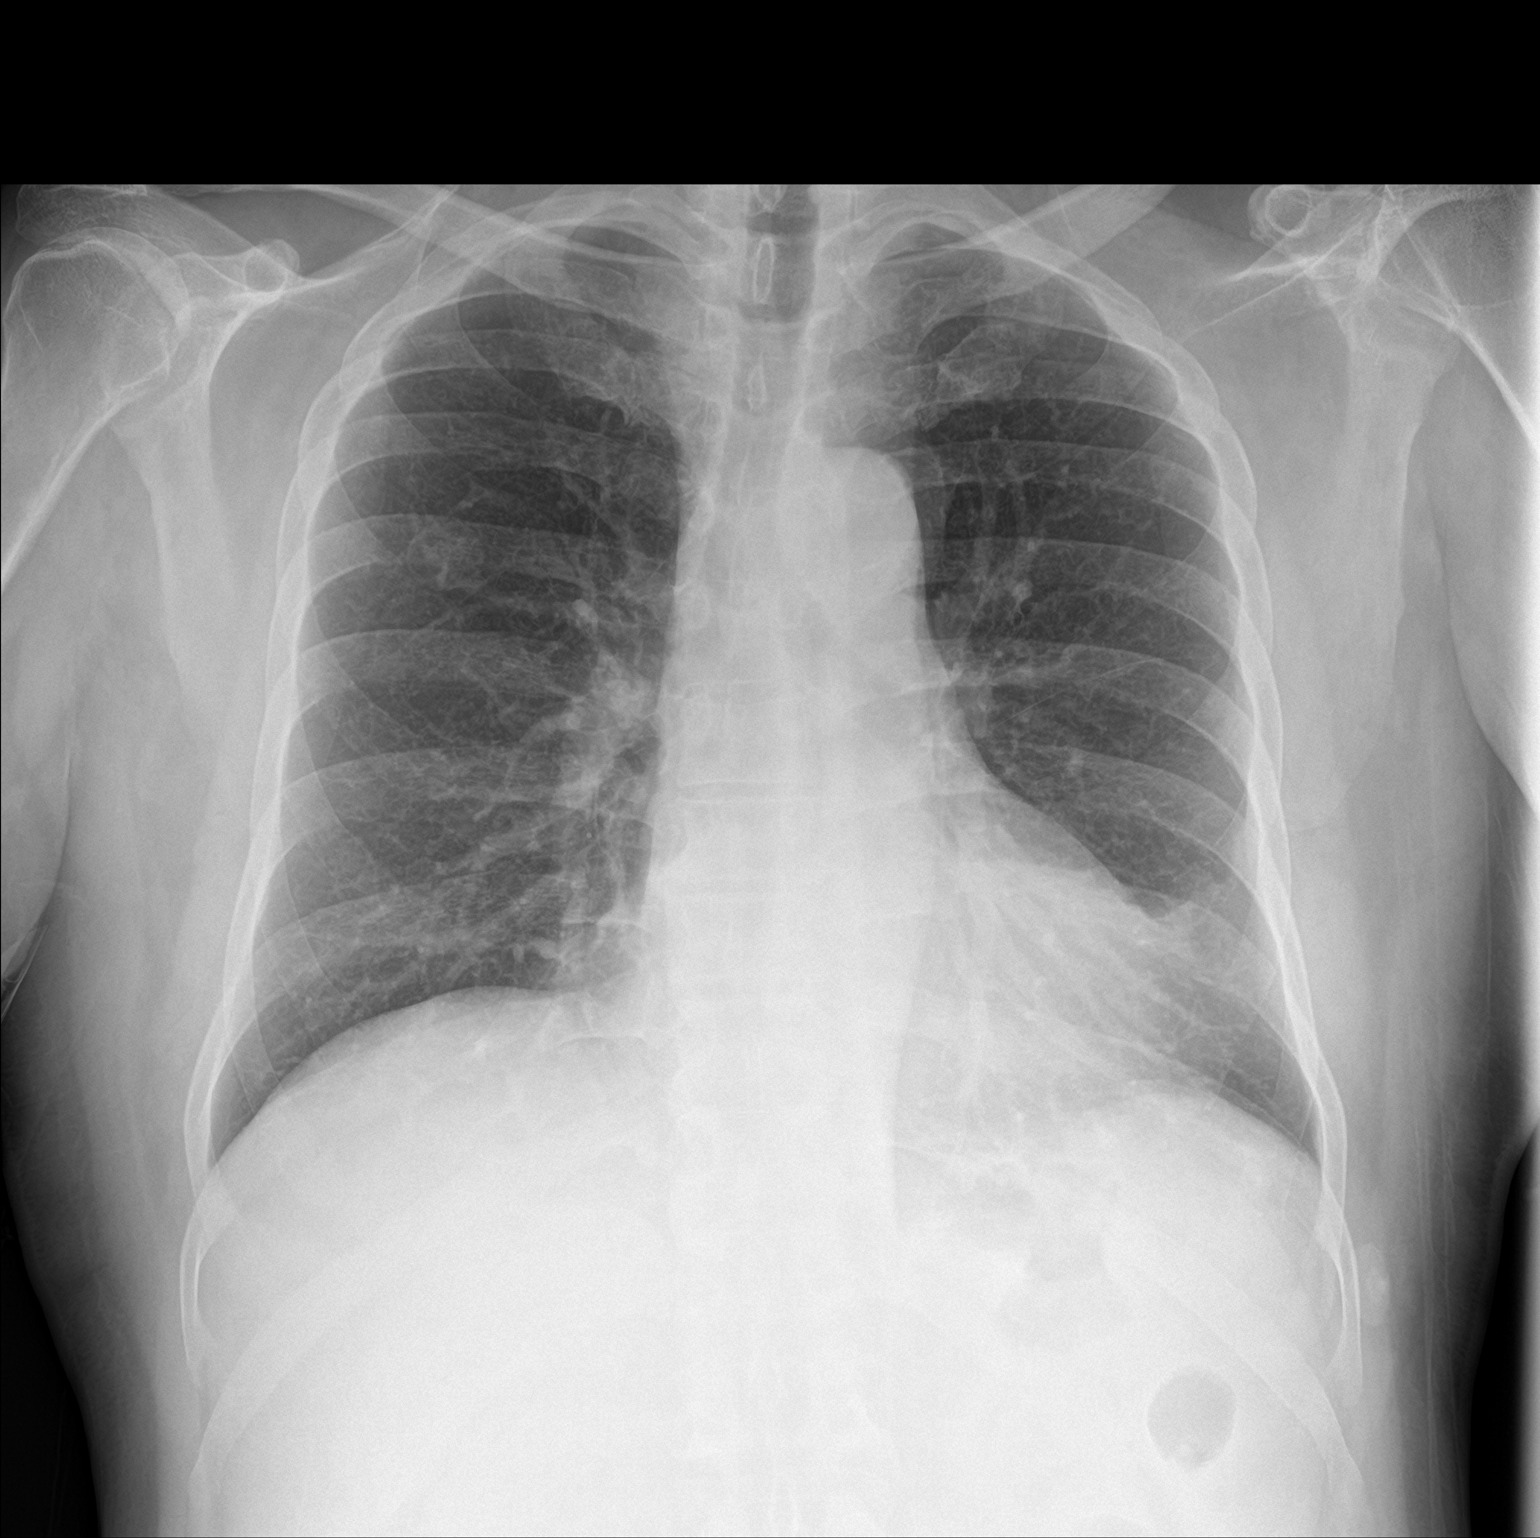

[chest lat]
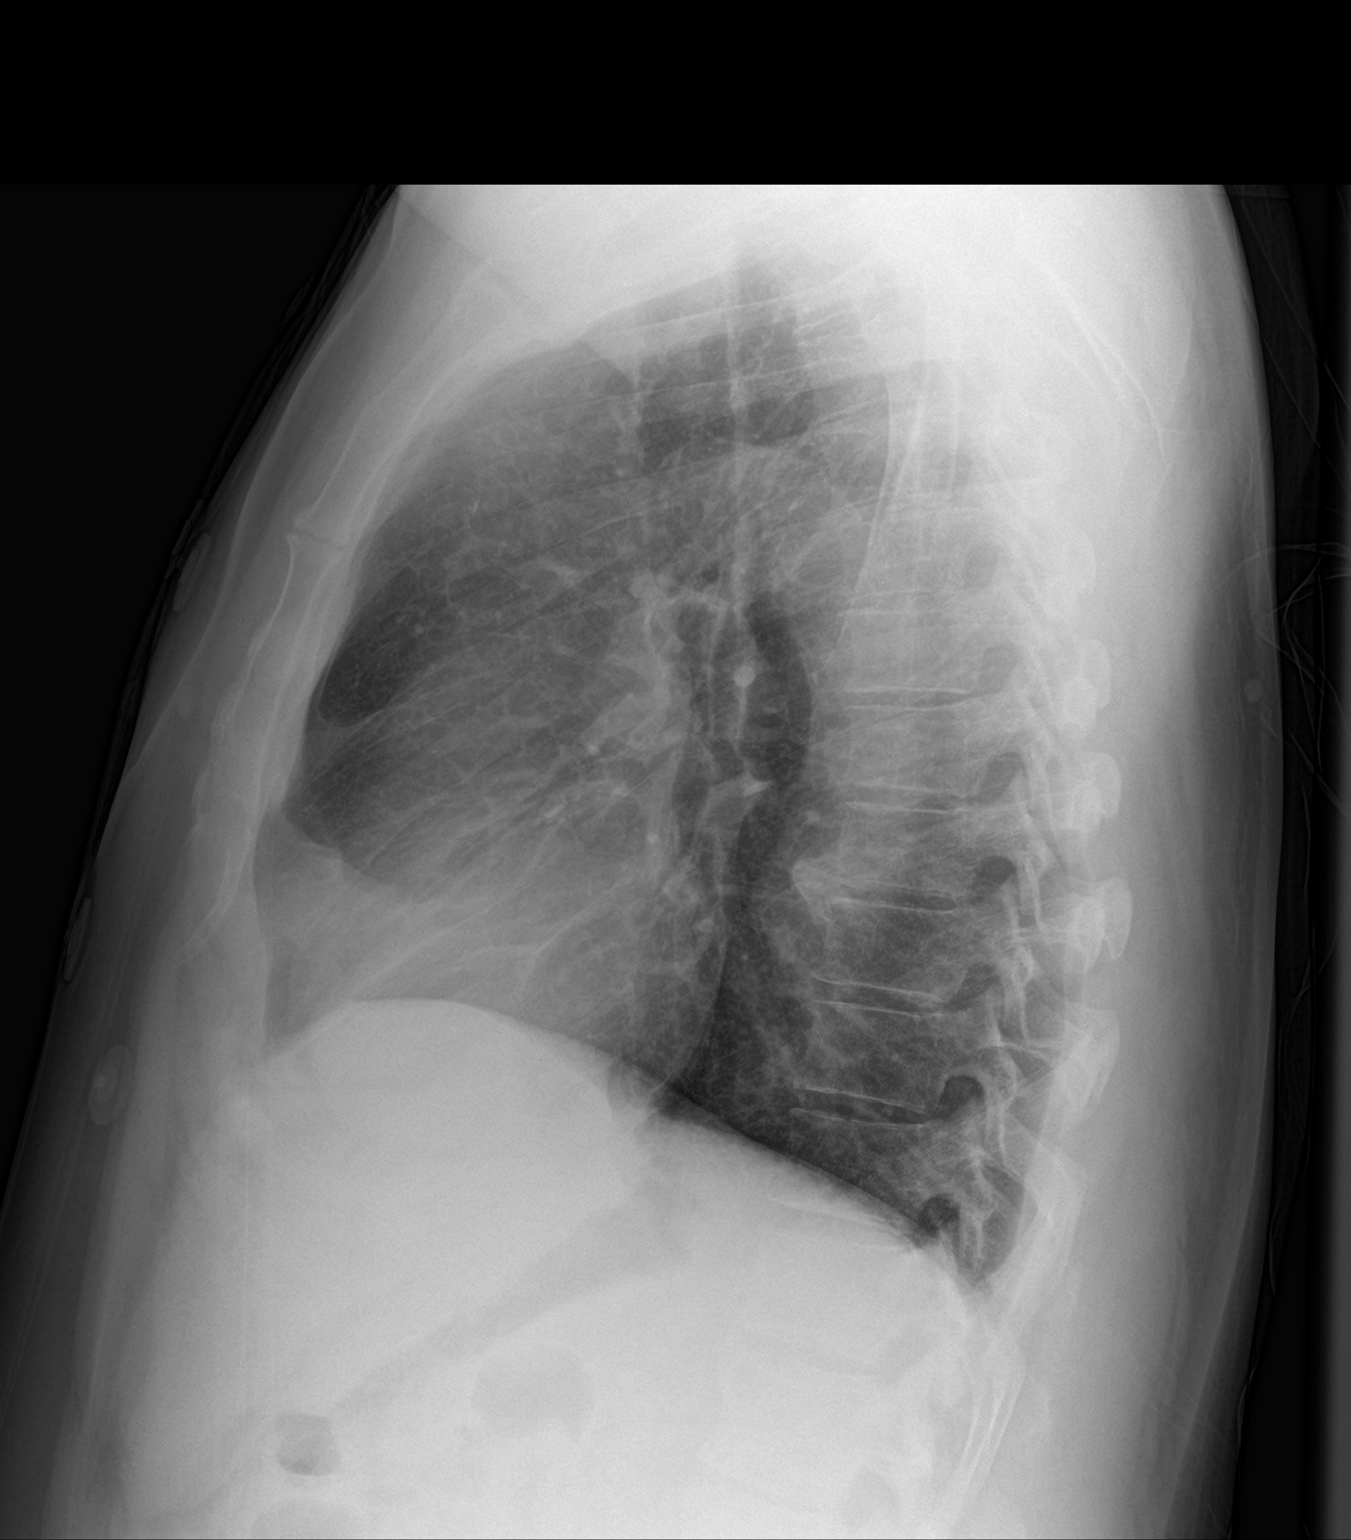

[2 of 2 positions shown; findings below may reference images not displayed]

FINDINGS: There is slight scarring in the left lower lung zone. Lungs
elsewhere are clear. Heart size and pulmonary vascularity are
normal. No adenopathy. There is calcification in the aortic arch
region. There is degenerative change in the lower thoracic spine
region.
IMPRESSION: Mild scarring left lower lung zone. No edema or consolidation.
Aortic atherosclerosis.

## 2017-07-07 ENCOUNTER — Emergency Department (HOSPITAL_COMMUNITY)
Admission: EM | Admit: 2017-07-07 | Discharge: 2017-07-08 | Disposition: A | Discharge status: Home / Self Care | Payer: Medicaid Other | Attending: Emergency Medicine | Admitting: Emergency Medicine

## 2017-07-07 ENCOUNTER — Emergency Department (HOSPITAL_COMMUNITY): Payer: Medicaid Other

## 2017-07-07 ENCOUNTER — Encounter (HOSPITAL_COMMUNITY): Payer: Self-pay

## 2017-07-07 DIAGNOSIS — T675XXA Heat exhaustion, unspecified, initial encounter: Secondary | ICD-10-CM | POA: Diagnosis not present

## 2017-07-07 DIAGNOSIS — R51 Headache: Secondary | ICD-10-CM | POA: Insufficient documentation

## 2017-07-07 DIAGNOSIS — F17210 Nicotine dependence, cigarettes, uncomplicated: Secondary | ICD-10-CM | POA: Insufficient documentation

## 2017-07-07 DIAGNOSIS — Z79899 Other long term (current) drug therapy: Secondary | ICD-10-CM | POA: Insufficient documentation

## 2017-07-07 DIAGNOSIS — F10920 Alcohol use, unspecified with intoxication, uncomplicated: Secondary | ICD-10-CM

## 2017-07-07 DIAGNOSIS — E860 Dehydration: Secondary | ICD-10-CM

## 2017-07-07 DIAGNOSIS — I5032 Chronic diastolic (congestive) heart failure: Secondary | ICD-10-CM | POA: Insufficient documentation

## 2017-07-07 DIAGNOSIS — Z794 Long term (current) use of insulin: Secondary | ICD-10-CM | POA: Diagnosis not present

## 2017-07-07 DIAGNOSIS — R531 Weakness: Secondary | ICD-10-CM | POA: Insufficient documentation

## 2017-07-07 DIAGNOSIS — N183 Chronic kidney disease, stage 3 (moderate): Secondary | ICD-10-CM | POA: Insufficient documentation

## 2017-07-07 DIAGNOSIS — R42 Dizziness and giddiness: Secondary | ICD-10-CM | POA: Diagnosis present

## 2017-07-07 DIAGNOSIS — I2510 Atherosclerotic heart disease of native coronary artery without angina pectoris: Secondary | ICD-10-CM | POA: Diagnosis not present

## 2017-07-07 DIAGNOSIS — I130 Hypertensive heart and chronic kidney disease with heart failure and stage 1 through stage 4 chronic kidney disease, or unspecified chronic kidney disease: Secondary | ICD-10-CM | POA: Insufficient documentation

## 2017-07-07 DIAGNOSIS — E1121 Type 2 diabetes mellitus with diabetic nephropathy: Secondary | ICD-10-CM | POA: Insufficient documentation

## 2017-07-07 DIAGNOSIS — Z8673 Personal history of transient ischemic attack (TIA), and cerebral infarction without residual deficits: Secondary | ICD-10-CM | POA: Insufficient documentation

## 2017-07-07 DIAGNOSIS — Z8546 Personal history of malignant neoplasm of prostate: Secondary | ICD-10-CM | POA: Diagnosis not present

## 2017-07-07 LAB — HEPATIC FUNCTION PANEL
ALT: 52 U/L (ref 17–63)
AST: 82 U/L — ABNORMAL HIGH (ref 15–41)
Albumin: 3.6 g/dL (ref 3.5–5.0)
Alkaline Phosphatase: 75 U/L (ref 38–126)
Bilirubin, Direct: 0.3 mg/dL (ref 0.1–0.5)
Indirect Bilirubin: 0.8 mg/dL (ref 0.3–0.9)
Total Bilirubin: 1.1 mg/dL (ref 0.3–1.2)
Total Protein: 8.8 g/dL — ABNORMAL HIGH (ref 6.5–8.1)

## 2017-07-07 LAB — RAPID URINE DRUG SCREEN, HOSP PERFORMED
Amphetamines: NOT DETECTED
Barbiturates: NOT DETECTED
Benzodiazepines: NOT DETECTED
Cocaine: NOT DETECTED
Opiates: NOT DETECTED
Tetrahydrocannabinol: NOT DETECTED

## 2017-07-07 LAB — URINALYSIS, ROUTINE W REFLEX MICROSCOPIC
Bacteria, UA: NONE SEEN
Bilirubin Urine: NEGATIVE
Glucose, UA: >=500 mg/dL — AB
Ketones, ur: NEGATIVE mg/dL
Leukocytes, UA: NEGATIVE
Nitrite: NEGATIVE
Protein, ur: 100 mg/dL — AB
Specific Gravity, Urine: 1.007 (ref 1.005–1.030)
pH: 5 (ref 5.0–8.0)

## 2017-07-07 LAB — CBC
HCT: 36.8 % — ABNORMAL LOW (ref 39.0–52.0)
Hemoglobin: 12.4 g/dL — ABNORMAL LOW (ref 13.0–17.0)
MCH: 31.6 pg (ref 26.0–34.0)
MCHC: 33.7 g/dL (ref 30.0–36.0)
MCV: 93.9 fL (ref 78.0–100.0)
Platelets: 148 10*3/uL — ABNORMAL LOW (ref 150–400)
RBC: 3.92 MIL/uL — ABNORMAL LOW (ref 4.22–5.81)
RDW: 13.2 % (ref 11.5–15.5)
WBC: 4.2 10*3/uL (ref 4.0–10.5)

## 2017-07-07 LAB — BASIC METABOLIC PANEL
Anion gap: 13 (ref 5–15)
BUN: 12 mg/dL (ref 6–20)
CO2: 19 mmol/L — ABNORMAL LOW (ref 22–32)
Calcium: 8.3 mg/dL — ABNORMAL LOW (ref 8.9–10.3)
Chloride: 99 mmol/L — ABNORMAL LOW (ref 101–111)
Creatinine, Ser: 2.02 mg/dL — ABNORMAL HIGH (ref 0.61–1.24)
GFR calc Af Amer: 40 mL/min — ABNORMAL LOW (ref >60)
GFR calc non Af Amer: 35 mL/min — ABNORMAL LOW (ref >60)
Glucose, Bld: 227 mg/dL — ABNORMAL HIGH (ref 65–99)
Potassium: 3.4 mmol/L — ABNORMAL LOW (ref 3.5–5.1)
Sodium: 131 mmol/L — ABNORMAL LOW (ref 135–145)

## 2017-07-07 LAB — AMMONIA: Ammonia: 29 umol/L (ref 9–35)

## 2017-07-07 LAB — ETHANOL: Alcohol, Ethyl (B): 163 mg/dL — ABNORMAL HIGH (ref <5)

## 2017-07-07 LAB — CBG MONITORING, ED: Glucose-Capillary: 220 mg/dL — ABNORMAL HIGH (ref 65–99)

## 2017-07-07 MED ORDER — SODIUM CHLORIDE 0.9 % IV BOLUS (SEPSIS)
1000.0000 mL | Freq: Once | INTRAVENOUS | Status: AC
  Administered 2017-07-07: 1000 mL via INTRAVENOUS

## 2017-07-07 NOTE — Discharge Instructions (Signed)
Stay hydrated.   Avoid drinking alcohol.   Try and avoid being out in the sun.   You were given list of shelters.   See your doctor  Return to ER if you have worse dizziness, passing out, chest pain, headaches, vomiting, fever.
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
## 2017-07-07 NOTE — ED Notes (Signed)
1L NS started @ 2210
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
## 2017-07-07 NOTE — ED Notes (Signed)
Pt verbalized understanding of discharge instructions. Pt given socks and snacks upon discharge.

## 2017-07-08 ENCOUNTER — Observation Stay (HOSPITAL_COMMUNITY)
Admission: EM | Admit: 2017-07-08 | Discharge: 2017-07-09 | Disposition: A | Discharge status: Home / Self Care | Payer: Medicaid Other | Attending: Internal Medicine | Admitting: Internal Medicine

## 2017-07-08 ENCOUNTER — Other Ambulatory Visit: Payer: Self-pay

## 2017-07-08 ENCOUNTER — Observation Stay (HOSPITAL_COMMUNITY): Payer: Medicaid Other

## 2017-07-08 ENCOUNTER — Emergency Department (HOSPITAL_COMMUNITY): Payer: Medicaid Other

## 2017-07-08 ENCOUNTER — Emergency Department (HOSPITAL_BASED_OUTPATIENT_CLINIC_OR_DEPARTMENT_OTHER): Admit: 2017-07-08 | Discharge: 2017-07-08 | Disposition: A | Discharge status: Home / Self Care | Payer: Medicaid Other

## 2017-07-08 ENCOUNTER — Encounter (HOSPITAL_COMMUNITY): Payer: Self-pay | Admitting: Emergency Medicine

## 2017-07-08 DIAGNOSIS — R0781 Pleurodynia: Secondary | ICD-10-CM

## 2017-07-08 DIAGNOSIS — E119 Type 2 diabetes mellitus without complications: Secondary | ICD-10-CM

## 2017-07-08 DIAGNOSIS — E1165 Type 2 diabetes mellitus with hyperglycemia: Secondary | ICD-10-CM | POA: Insufficient documentation

## 2017-07-08 DIAGNOSIS — I4892 Unspecified atrial flutter: Secondary | ICD-10-CM | POA: Diagnosis present

## 2017-07-08 DIAGNOSIS — I2510 Atherosclerotic heart disease of native coronary artery without angina pectoris: Secondary | ICD-10-CM | POA: Diagnosis not present

## 2017-07-08 DIAGNOSIS — I5023 Acute on chronic systolic (congestive) heart failure: Secondary | ICD-10-CM | POA: Diagnosis not present

## 2017-07-08 DIAGNOSIS — Z8673 Personal history of transient ischemic attack (TIA), and cerebral infarction without residual deficits: Secondary | ICD-10-CM | POA: Insufficient documentation

## 2017-07-08 DIAGNOSIS — R000 Tachycardia, unspecified: Secondary | ICD-10-CM

## 2017-07-08 DIAGNOSIS — I10 Essential (primary) hypertension: Secondary | ICD-10-CM | POA: Diagnosis present

## 2017-07-08 DIAGNOSIS — I130 Hypertensive heart and chronic kidney disease with heart failure and stage 1 through stage 4 chronic kidney disease, or unspecified chronic kidney disease: Secondary | ICD-10-CM | POA: Diagnosis not present

## 2017-07-08 DIAGNOSIS — Y906 Blood alcohol level of 120-199 mg/100 ml: Secondary | ICD-10-CM | POA: Diagnosis not present

## 2017-07-08 DIAGNOSIS — N183 Chronic kidney disease, stage 3 (moderate): Secondary | ICD-10-CM | POA: Insufficient documentation

## 2017-07-08 DIAGNOSIS — E785 Hyperlipidemia, unspecified: Secondary | ICD-10-CM | POA: Diagnosis not present

## 2017-07-08 DIAGNOSIS — R071 Chest pain on breathing: Secondary | ICD-10-CM | POA: Diagnosis present

## 2017-07-08 DIAGNOSIS — H538 Other visual disturbances: Secondary | ICD-10-CM | POA: Diagnosis not present

## 2017-07-08 DIAGNOSIS — Z5900 Homelessness unspecified: Secondary | ICD-10-CM

## 2017-07-08 DIAGNOSIS — K7460 Unspecified cirrhosis of liver: Secondary | ICD-10-CM | POA: Insufficient documentation

## 2017-07-08 DIAGNOSIS — R7989 Other specified abnormal findings of blood chemistry: Secondary | ICD-10-CM | POA: Diagnosis present

## 2017-07-08 DIAGNOSIS — M79609 Pain in unspecified limb: Secondary | ICD-10-CM | POA: Diagnosis not present

## 2017-07-08 DIAGNOSIS — N179 Acute kidney failure, unspecified: Secondary | ICD-10-CM | POA: Diagnosis present

## 2017-07-08 DIAGNOSIS — R778 Other specified abnormalities of plasma proteins: Secondary | ICD-10-CM | POA: Diagnosis present

## 2017-07-08 DIAGNOSIS — B182 Chronic viral hepatitis C: Secondary | ICD-10-CM | POA: Insufficient documentation

## 2017-07-08 DIAGNOSIS — F1010 Alcohol abuse, uncomplicated: Secondary | ICD-10-CM | POA: Insufficient documentation

## 2017-07-08 DIAGNOSIS — I428 Other cardiomyopathies: Secondary | ICD-10-CM | POA: Insufficient documentation

## 2017-07-08 DIAGNOSIS — I5043 Acute on chronic combined systolic (congestive) and diastolic (congestive) heart failure: Secondary | ICD-10-CM | POA: Diagnosis present

## 2017-07-08 DIAGNOSIS — I480 Paroxysmal atrial fibrillation: Secondary | ICD-10-CM | POA: Diagnosis not present

## 2017-07-08 DIAGNOSIS — E1140 Type 2 diabetes mellitus with diabetic neuropathy, unspecified: Secondary | ICD-10-CM | POA: Diagnosis not present

## 2017-07-08 DIAGNOSIS — F17210 Nicotine dependence, cigarettes, uncomplicated: Secondary | ICD-10-CM | POA: Insufficient documentation

## 2017-07-08 DIAGNOSIS — R55 Syncope and collapse: Secondary | ICD-10-CM | POA: Diagnosis not present

## 2017-07-08 DIAGNOSIS — Z79899 Other long term (current) drug therapy: Secondary | ICD-10-CM | POA: Insufficient documentation

## 2017-07-08 DIAGNOSIS — R609 Edema, unspecified: Secondary | ICD-10-CM

## 2017-07-08 DIAGNOSIS — E1122 Type 2 diabetes mellitus with diabetic chronic kidney disease: Secondary | ICD-10-CM | POA: Insufficient documentation

## 2017-07-08 DIAGNOSIS — E118 Type 2 diabetes mellitus with unspecified complications: Secondary | ICD-10-CM

## 2017-07-08 DIAGNOSIS — R531 Weakness: Secondary | ICD-10-CM | POA: Diagnosis not present

## 2017-07-08 DIAGNOSIS — Z794 Long term (current) use of insulin: Secondary | ICD-10-CM | POA: Insufficient documentation

## 2017-07-08 DIAGNOSIS — I4891 Unspecified atrial fibrillation: Secondary | ICD-10-CM

## 2017-07-08 DIAGNOSIS — R42 Dizziness and giddiness: Secondary | ICD-10-CM | POA: Insufficient documentation

## 2017-07-08 DIAGNOSIS — F1994 Other psychoactive substance use, unspecified with psychoactive substance-induced mood disorder: Secondary | ICD-10-CM | POA: Diagnosis present

## 2017-07-08 DIAGNOSIS — R51 Headache: Secondary | ICD-10-CM | POA: Diagnosis not present

## 2017-07-08 DIAGNOSIS — K219 Gastro-esophageal reflux disease without esophagitis: Secondary | ICD-10-CM | POA: Diagnosis not present

## 2017-07-08 DIAGNOSIS — R079 Chest pain, unspecified: Secondary | ICD-10-CM | POA: Diagnosis not present

## 2017-07-08 DIAGNOSIS — F329 Major depressive disorder, single episode, unspecified: Secondary | ICD-10-CM | POA: Insufficient documentation

## 2017-07-08 DIAGNOSIS — F1911 Other psychoactive substance abuse, in remission: Secondary | ICD-10-CM

## 2017-07-08 DIAGNOSIS — R748 Abnormal levels of other serum enzymes: Secondary | ICD-10-CM

## 2017-07-08 DIAGNOSIS — I248 Other forms of acute ischemic heart disease: Secondary | ICD-10-CM | POA: Insufficient documentation

## 2017-07-08 LAB — CBC WITH DIFFERENTIAL/PLATELET
Basophils Absolute: 0 10*3/uL (ref 0.0–0.1)
Basophils Relative: 1 %
Eosinophils Absolute: 0.1 10*3/uL (ref 0.0–0.7)
Eosinophils Relative: 2 %
HCT: 40.3 % (ref 39.0–52.0)
Hemoglobin: 14.4 g/dL (ref 13.0–17.0)
Lymphocytes Relative: 35 %
Lymphs Abs: 1.5 10*3/uL (ref 0.7–4.0)
MCH: 33.6 pg (ref 26.0–34.0)
MCHC: 35.7 g/dL (ref 30.0–36.0)
MCV: 94.2 fL (ref 78.0–100.0)
Monocytes Absolute: 0.4 10*3/uL (ref 0.1–1.0)
Monocytes Relative: 10 %
Neutro Abs: 2.3 10*3/uL (ref 1.7–7.7)
Neutrophils Relative %: 52 %
Platelets: 129 10*3/uL — ABNORMAL LOW (ref 150–400)
RBC: 4.28 MIL/uL (ref 4.22–5.81)
RDW: 13 % (ref 11.5–15.5)
WBC: 4.2 10*3/uL (ref 4.0–10.5)

## 2017-07-08 LAB — RAPID URINE DRUG SCREEN, HOSP PERFORMED
Amphetamines: NOT DETECTED
Barbiturates: NOT DETECTED
Benzodiazepines: NOT DETECTED
Cocaine: NOT DETECTED
Opiates: NOT DETECTED
Tetrahydrocannabinol: NOT DETECTED

## 2017-07-08 LAB — I-STAT TROPONIN, ED: Troponin i, poc: 0.13 ng/mL (ref 0.00–0.08)

## 2017-07-08 LAB — COMPREHENSIVE METABOLIC PANEL
ALT: 49 U/L (ref 17–63)
AST: 69 U/L — ABNORMAL HIGH (ref 15–41)
Albumin: 3.6 g/dL (ref 3.5–5.0)
Alkaline Phosphatase: 81 U/L (ref 38–126)
Anion gap: 12 (ref 5–15)
BUN: 13 mg/dL (ref 6–20)
CO2: 21 mmol/L — ABNORMAL LOW (ref 22–32)
Calcium: 8.6 mg/dL — ABNORMAL LOW (ref 8.9–10.3)
Chloride: 103 mmol/L (ref 101–111)
Creatinine, Ser: 1.73 mg/dL — ABNORMAL HIGH (ref 0.61–1.24)
GFR calc Af Amer: 49 mL/min — ABNORMAL LOW (ref >60)
GFR calc non Af Amer: 42 mL/min — ABNORMAL LOW (ref >60)
Glucose, Bld: 269 mg/dL — ABNORMAL HIGH (ref 65–99)
Potassium: 3.4 mmol/L — ABNORMAL LOW (ref 3.5–5.1)
Sodium: 136 mmol/L (ref 135–145)
Total Bilirubin: 1.2 mg/dL (ref 0.3–1.2)
Total Protein: 8.5 g/dL — ABNORMAL HIGH (ref 6.5–8.1)

## 2017-07-08 LAB — I-STAT CHEM 8, ED
BUN: 15 mg/dL (ref 6–20)
Calcium, Ion: 1.02 mmol/L — ABNORMAL LOW (ref 1.15–1.40)
Chloride: 102 mmol/L (ref 101–111)
Creatinine, Ser: 1.6 mg/dL — ABNORMAL HIGH (ref 0.61–1.24)
Glucose, Bld: 270 mg/dL — ABNORMAL HIGH (ref 65–99)
HCT: 43 % (ref 39.0–52.0)
Hemoglobin: 14.6 g/dL (ref 13.0–17.0)
Potassium: 3.5 mmol/L (ref 3.5–5.1)
Sodium: 140 mmol/L (ref 135–145)
TCO2: 24 mmol/L (ref 0–100)

## 2017-07-08 LAB — MAGNESIUM: Magnesium: 1.7 mg/dL (ref 1.7–2.4)

## 2017-07-08 LAB — CBG MONITORING, ED: Glucose-Capillary: 254 mg/dL — ABNORMAL HIGH (ref 65–99)

## 2017-07-08 LAB — GLUCOSE, CAPILLARY: Glucose-Capillary: 213 mg/dL — ABNORMAL HIGH (ref 65–99)

## 2017-07-08 LAB — ETHANOL: Alcohol, Ethyl (B): <5 mg/dL (ref <5)

## 2017-07-08 LAB — TSH: TSH: 1.56 u[IU]/mL (ref 0.350–4.500)

## 2017-07-08 LAB — TROPONIN I
Troponin I: 0.15 ng/mL (ref <0.03)
Troponin I: 0.15 ng/mL (ref <0.03)

## 2017-07-08 LAB — BRAIN NATRIURETIC PEPTIDE: B Natriuretic Peptide: 308.6 pg/mL — ABNORMAL HIGH (ref 0.0–100.0)

## 2017-07-08 LAB — HEPARIN LEVEL (UNFRACTIONATED): Heparin Unfractionated: 0.29 IU/mL — ABNORMAL LOW (ref 0.30–0.70)

## 2017-07-08 MED ORDER — FUROSEMIDE 10 MG/ML IJ SOLN
40.0000 mg | Freq: Once | INTRAMUSCULAR | Status: AC
  Administered 2017-07-08: 40 mg via INTRAVENOUS
  Filled 2017-07-08: qty 4

## 2017-07-08 MED ORDER — METOPROLOL TARTRATE 5 MG/5ML IV SOLN
5.0000 mg | Freq: Once | INTRAVENOUS | Status: AC
  Administered 2017-07-08: 5 mg via INTRAVENOUS
  Filled 2017-07-08: qty 5

## 2017-07-08 MED ORDER — IOPAMIDOL (ISOVUE-370) INJECTION 76%
INTRAVENOUS | Status: AC
  Administered 2017-07-08: 100 mL
  Filled 2017-07-08: qty 100

## 2017-07-08 MED ORDER — METOPROLOL TARTRATE 5 MG/5ML IV SOLN: 5.0000 mg | INTRAVENOUS | Status: DC | PRN

## 2017-07-08 MED ORDER — HYDRALAZINE HCL 20 MG/ML IJ SOLN: 5.0000 mg | INTRAMUSCULAR | Status: DC | PRN

## 2017-07-08 MED ORDER — SODIUM CHLORIDE 0.9 % IV SOLN: INTRAVENOUS | Status: DC

## 2017-07-08 MED ORDER — HEPARIN BOLUS VIA INFUSION
4000.0000 [IU] | Freq: Once | INTRAVENOUS | Status: AC
  Administered 2017-07-08: 4000 [IU] via INTRAVENOUS
  Filled 2017-07-08: qty 4000

## 2017-07-08 MED ORDER — ACETAMINOPHEN 325 MG PO TABS
650.0000 mg | ORAL_TABLET | Freq: Four times a day (QID) | ORAL | Status: DC | PRN
  Administered 2017-07-08: 650 mg via ORAL
  Filled 2017-07-08: qty 2

## 2017-07-08 MED ORDER — INSULIN ASPART 100 UNIT/ML ~~LOC~~ SOLN: 0.0000 [IU] | Freq: Every day | SUBCUTANEOUS | Status: DC

## 2017-07-08 MED ORDER — ATORVASTATIN CALCIUM 40 MG PO TABS
40.0000 mg | ORAL_TABLET | Freq: Every day | ORAL | Status: DC
  Administered 2017-07-09: 40 mg via ORAL
  Filled 2017-07-08: qty 1

## 2017-07-08 MED ORDER — AMITRIPTYLINE HCL 75 MG PO TABS
75.0000 mg | ORAL_TABLET | Freq: Every day | ORAL | Status: DC
  Administered 2017-07-08: 75 mg via ORAL
  Filled 2017-07-08 (×2): qty 1

## 2017-07-08 MED ORDER — NICOTINE 14 MG/24HR TD PT24
14.0000 mg | MEDICATED_PATCH | Freq: Every day | TRANSDERMAL | Status: DC
  Administered 2017-07-09: 14 mg via TRANSDERMAL
  Filled 2017-07-08: qty 1

## 2017-07-08 MED ORDER — THIAMINE HCL 100 MG/ML IJ SOLN: 100.0000 mg | Freq: Every day | INTRAMUSCULAR | Status: DC

## 2017-07-08 MED ORDER — ENOXAPARIN SODIUM 40 MG/0.4ML ~~LOC~~ SOLN: 40.0000 mg | SUBCUTANEOUS | Status: DC

## 2017-07-08 MED ORDER — INSULIN GLARGINE 100 UNIT/ML ~~LOC~~ SOLN
15.0000 [IU] | Freq: Every day | SUBCUTANEOUS | Status: DC
  Administered 2017-07-08: 15 [IU] via SUBCUTANEOUS
  Filled 2017-07-08 (×2): qty 0.15

## 2017-07-08 MED ORDER — AMLODIPINE BESYLATE 5 MG PO TABS
10.0000 mg | ORAL_TABLET | Freq: Every day | ORAL | Status: DC
  Administered 2017-07-08: 10 mg via ORAL
  Filled 2017-07-08 (×2): qty 2

## 2017-07-08 MED ORDER — HEPARIN (PORCINE) IN NACL 100-0.45 UNIT/ML-% IJ SOLN
1400.0000 [IU]/h | INTRAMUSCULAR | Status: DC
  Administered 2017-07-08: 1200 [IU]/h via INTRAVENOUS
  Administered 2017-07-09: 1400 [IU]/h via INTRAVENOUS
  Filled 2017-07-08 (×2): qty 250

## 2017-07-08 MED ORDER — INSULIN ASPART 100 UNIT/ML ~~LOC~~ SOLN
0.0000 [IU] | Freq: Three times a day (TID) | SUBCUTANEOUS | Status: DC
  Administered 2017-07-08: 8 [IU] via SUBCUTANEOUS

## 2017-07-08 MED ORDER — SODIUM CHLORIDE 0.9 % IV BOLUS (SEPSIS)
1000.0000 mL | Freq: Once | INTRAVENOUS | Status: AC
  Administered 2017-07-08: 1000 mL via INTRAVENOUS

## 2017-07-08 MED ORDER — ASPIRIN EC 81 MG PO TBEC
81.0000 mg | DELAYED_RELEASE_TABLET | Freq: Every day | ORAL | Status: DC
  Administered 2017-07-08: 81 mg via ORAL
  Filled 2017-07-08: qty 1

## 2017-07-08 MED ORDER — VITAMIN B-1 100 MG PO TABS
100.0000 mg | ORAL_TABLET | Freq: Every day | ORAL | Status: DC
  Administered 2017-07-09: 100 mg via ORAL
  Filled 2017-07-08: qty 1

## 2017-07-08 MED ORDER — SODIUM CHLORIDE 0.9 % IV SOLN
INTRAVENOUS | Status: DC
  Administered 2017-07-08: 20:00:00 via INTRAVENOUS

## 2017-07-08 MED ORDER — ADULT MULTIVITAMIN W/MINERALS CH
1.0000 | ORAL_TABLET | Freq: Every day | ORAL | Status: DC
  Administered 2017-07-09: 1 via ORAL
  Filled 2017-07-08: qty 1

## 2017-07-08 MED ORDER — NICOTINE 14 MG/24HR TD PT24: 14.0000 mg | MEDICATED_PATCH | Freq: Every day | TRANSDERMAL | Status: DC

## 2017-07-08 MED ORDER — ACETAMINOPHEN 650 MG RE SUPP: 650.0000 mg | Freq: Four times a day (QID) | RECTAL | Status: DC | PRN

## 2017-07-08 MED ORDER — INSULIN ASPART 100 UNIT/ML ~~LOC~~ SOLN
0.0000 [IU] | Freq: Three times a day (TID) | SUBCUTANEOUS | Status: DC
  Administered 2017-07-09: 3 [IU] via SUBCUTANEOUS
  Administered 2017-07-09: 5 [IU] via SUBCUTANEOUS
  Administered 2017-07-09: 3 [IU] via SUBCUTANEOUS

## 2017-07-08 MED ORDER — FOLIC ACID 1 MG PO TABS
1.0000 mg | ORAL_TABLET | Freq: Every day | ORAL | Status: DC
  Administered 2017-07-09: 1 mg via ORAL
  Filled 2017-07-08: qty 1

## 2017-07-08 MED ORDER — CARVEDILOL 12.5 MG PO TABS
6.2500 mg | ORAL_TABLET | Freq: Two times a day (BID) | ORAL | Status: DC
  Administered 2017-07-08 – 2017-07-09 (×3): 6.25 mg via ORAL
  Filled 2017-07-08 (×3): qty 1

## 2017-07-08 MED ORDER — PANTOPRAZOLE SODIUM 40 MG PO TBEC
40.0000 mg | DELAYED_RELEASE_TABLET | Freq: Every day | ORAL | Status: DC
  Administered 2017-07-09: 40 mg via ORAL
  Filled 2017-07-08: qty 1
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
## 2017-07-08 NOTE — ED Notes (Signed)
Patient transported to Ultrasound 

## 2017-07-08 NOTE — ED Triage Notes (Signed)
Pt c/o right ankle swelling related to an injury. Pt also reports blurred vision x 2 days. Pt states he was discharged from this ED at about 11:30pm tonight, and he is concerned because he doesn't feel better.

## 2017-07-08 NOTE — ED Provider Notes (Signed)
Medical screening examination/treatment/procedure(s) were conducted as a shared visit with non-physician practitioner(s) and myself.  I personally evaluated the patient during the encounter.  Patient is here with multiple complaints. It seems that over the last couple days he has had worsening dizziness and weakness. He has recently become homeless and has been exerting himself however patient does not think this is related. He does not have any chest pain or shortness of breath but does complain of some recurrent right leg swelling secondary to surgery. On exam has a regular heart rate in the 95-105 range. Dry mucous membranes. Has a nonfocal neurologic exam however I did not walk him. He has right greater than left lower show any edema. Symptoms could be related to the age of fibrillation which seems to be new. He also related to worsening heart failure. Patient has a positive troponin and so could be a primary ACS event. Patient likely need to be admitted to get these things evaluated.   EKG Interpretation  Date/Time:  Wednesday July 08 2017 07:20:44 EDT Ventricular Rate:  118 PR Interval:    QRS Duration: 97 QT Interval:  335 QTC Calculation: 476 R Axis:   67 Text Interpretation:  Atrial flutter Anterior infarct, old Nonspecific T abnormalities, lateral leads ST elevation, consider inferior injury afib new since september, present last night Confirmed by Merrily Pew [id number]) on 07/08/2017 8:05:18 AM         [place] Pechacek, Corene Cornea, MD 07/09/17 7473

## 2017-07-08 NOTE — Progress Notes (Signed)
*  PRELIMINARY RESULTS* Vascular Ultrasound Right lower extremity venous duplex has been completed.  Preliminary findings: No evidence of deep vein thrombosis or baker's cysts in the visualized veins of the right lower extremity.   Everrett Coombe 07/08/2017, 8:46 AM
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
## 2017-07-09 ENCOUNTER — Observation Stay (HOSPITAL_COMMUNITY): Payer: Medicaid Other

## 2017-07-09 ENCOUNTER — Telehealth: Payer: Self-pay

## 2017-07-09 DIAGNOSIS — I5043 Acute on chronic combined systolic (congestive) and diastolic (congestive) heart failure: Secondary | ICD-10-CM | POA: Diagnosis present

## 2017-07-09 DIAGNOSIS — I2510 Atherosclerotic heart disease of native coronary artery without angina pectoris: Secondary | ICD-10-CM

## 2017-07-09 DIAGNOSIS — R55 Syncope and collapse: Secondary | ICD-10-CM | POA: Diagnosis not present

## 2017-07-09 DIAGNOSIS — I4892 Unspecified atrial flutter: Secondary | ICD-10-CM | POA: Diagnosis present

## 2017-07-09 DIAGNOSIS — I5023 Acute on chronic systolic (congestive) heart failure: Secondary | ICD-10-CM | POA: Diagnosis not present

## 2017-07-09 DIAGNOSIS — I130 Hypertensive heart and chronic kidney disease with heart failure and stage 1 through stage 4 chronic kidney disease, or unspecified chronic kidney disease: Secondary | ICD-10-CM | POA: Diagnosis not present

## 2017-07-09 DIAGNOSIS — E1165 Type 2 diabetes mellitus with hyperglycemia: Secondary | ICD-10-CM | POA: Diagnosis not present

## 2017-07-09 LAB — CBC
HCT: 38.1 % — ABNORMAL LOW (ref 39.0–52.0)
Hemoglobin: 13.5 g/dL (ref 13.0–17.0)
MCH: 33.6 pg (ref 26.0–34.0)
MCHC: 35.4 g/dL (ref 30.0–36.0)
MCV: 94.8 fL (ref 78.0–100.0)
Platelets: 135 10*3/uL — ABNORMAL LOW (ref 150–400)
RBC: 4.02 MIL/uL — ABNORMAL LOW (ref 4.22–5.81)
RDW: 12.9 % (ref 11.5–15.5)
WBC: 5.5 10*3/uL (ref 4.0–10.5)

## 2017-07-09 LAB — BASIC METABOLIC PANEL
Anion gap: 8 (ref 5–15)
BUN: 16 mg/dL (ref 6–20)
CO2: 27 mmol/L (ref 22–32)
Calcium: 8.3 mg/dL — ABNORMAL LOW (ref 8.9–10.3)
Chloride: 99 mmol/L — ABNORMAL LOW (ref 101–111)
Creatinine, Ser: 1.68 mg/dL — ABNORMAL HIGH (ref 0.61–1.24)
GFR calc Af Amer: 51 mL/min — ABNORMAL LOW (ref >60)
GFR calc non Af Amer: 44 mL/min — ABNORMAL LOW (ref >60)
Glucose, Bld: 222 mg/dL — ABNORMAL HIGH (ref 65–99)
Potassium: 3 mmol/L — ABNORMAL LOW (ref 3.5–5.1)
Sodium: 134 mmol/L — ABNORMAL LOW (ref 135–145)

## 2017-07-09 LAB — GLUCOSE, CAPILLARY
Glucose-Capillary: 264 mg/dL — ABNORMAL HIGH (ref 65–99)
Glucose-Capillary: 211 mg/dL — ABNORMAL HIGH (ref 65–99)
Glucose-Capillary: 225 mg/dL — ABNORMAL HIGH (ref 65–99)
Glucose-Capillary: 255 mg/dL — ABNORMAL HIGH (ref 65–99)

## 2017-07-09 LAB — TROPONIN I
Troponin I: 0.14 ng/mL (ref <0.03)
Troponin I: 0.15 ng/mL (ref <0.03)

## 2017-07-09 LAB — HEMOGLOBIN A1C
Hgb A1c MFr Bld: 8.1 % — ABNORMAL HIGH (ref 4.8–5.6)
Mean Plasma Glucose: 186 mg/dL

## 2017-07-09 LAB — HIV ANTIBODY (ROUTINE TESTING W REFLEX): HIV Screen 4th Generation wRfx: NONREACTIVE

## 2017-07-09 LAB — HEPARIN LEVEL (UNFRACTIONATED): Heparin Unfractionated: 0.24 IU/mL — ABNORMAL LOW (ref 0.30–0.70)

## 2017-07-09 MED ORDER — CLONIDINE HCL 0.1 MG PO TABS: 0.1000 mg | ORAL_TABLET | Freq: Two times a day (BID) | ORAL | Status: DC

## 2017-07-09 MED ORDER — CARVEDILOL 6.25 MG PO TABS: 6.2500 mg | ORAL_TABLET | Freq: Two times a day (BID) | ORAL | 0 refills | Status: DC

## 2017-07-09 MED ORDER — ENOXAPARIN SODIUM 40 MG/0.4ML ~~LOC~~ SOLN: 40.0000 mg | SUBCUTANEOUS | Status: DC

## 2017-07-09 MED ORDER — APIXABAN 5 MG PO TABS: 5.0000 mg | ORAL_TABLET | Freq: Two times a day (BID) | ORAL | 0 refills | Status: DC

## 2017-07-09 MED ORDER — LORAZEPAM 2 MG/ML IJ SOLN
0.5000 mg | Freq: Once | INTRAMUSCULAR | Status: AC
  Administered 2017-07-09: 0.5 mg via INTRAVENOUS
  Filled 2017-07-09: qty 1

## 2017-07-09 MED ORDER — PNEUMOCOCCAL VAC POLYVALENT 25 MCG/0.5ML IJ INJ
0.5000 mL | INJECTION | INTRAMUSCULAR | Status: AC
Start: 2017-07-10 — End: 2017-07-09
  Administered 2017-07-09: 0.5 mL via INTRAMUSCULAR
  Filled 2017-07-09: qty 0.5

## 2017-07-09 MED ORDER — APIXABAN 5 MG PO TABS
5.0000 mg | ORAL_TABLET | Freq: Two times a day (BID) | ORAL | 0 refills | Status: DC
Start: 2017-07-09 — End: 2017-10-07

## 2017-07-09 MED ORDER — POTASSIUM CHLORIDE CRYS ER 20 MEQ PO TBCR
20.0000 meq | EXTENDED_RELEASE_TABLET | Freq: Two times a day (BID) | ORAL | Status: DC
  Administered 2017-07-09: 20 meq via ORAL
  Filled 2017-07-09: qty 1

## 2017-07-09 MED ORDER — APIXABAN 5 MG PO TABS
5.0000 mg | ORAL_TABLET | Freq: Two times a day (BID) | ORAL | Status: DC
Start: 2017-07-09 — End: 2017-07-09
  Administered 2017-07-09: 5 mg via ORAL
  Filled 2017-07-09: qty 1

## 2017-07-09 MED ORDER — ACETAMINOPHEN 500 MG PO TABS
1000.0000 mg | ORAL_TABLET | Freq: Four times a day (QID) | ORAL | Status: DC | PRN
  Administered 2017-07-09 (×2): 1000 mg via ORAL
  Filled 2017-07-09 (×2): qty 2

## 2017-07-09 NOTE — Telephone Encounter (Signed)
The patient is known to [facility]. An appointment for follow up has been made for 07/15/17 @ 0945 and the information has been placed on the AVS. Update provided to Olga Coaster, RN CM

## 2017-07-09 NOTE — Progress Notes (Signed)
ANTICOAGULATION CONSULT NOTE - Follow Up Consult  Pharmacy Consult for heparin Indication: chest pain/ACS and atrial fibrillation  Labs:  Recent Labs  07/07/17 1757 07/08/17 0659 07/08/17 0714 07/08/17 1412 07/08/17 1840 07/08/17 2157 07/09/17 0048 07/09/17 0443  HGB 12.4* 14.4 14.6  --   --   --   --  13.5  HCT 36.8* 40.3 43.0  --   --   --   --  38.1*  PLT 148* 129*  --   --   --   --   --  135*  HEPARINUNFRC  --   --   --   --   --  0.29*  --  0.24*  CREATININE 2.02* 1.73* 1.60*  --   --   --   --   --   TROPONINI  --   --   --  0.15* 0.15*  --  0.14*  --     Assessment: 56yo male now w/ lower heparin level; pharmacist last pm had intended to have rate increased but order was not changed and RN was not contacted to change rate so it remains at 1200 units/hr as initially ordered.  Goal of Therapy:  Heparin level 0.3-0.7 units/ml   Plan:  Will increase heparin gtt by 2-3 units/kg/hr to 1400 units/hr and check level in 6hr.  Wynona Neat, PharmD, BCPS  07/09/2017,5:20 AM

## 2017-07-09 NOTE — Progress Notes (Signed)
Subjective: Patient had 10/10 headache overnight that has been ongoing since his fall. At the time he also complained of worsening right leg weakness worse than left. He had noted weakness of RLE overnight worse than left. Stat head CT was negative. This morning he continues to endorse left-sided headache and persistent right leg weakness. He otherwise understands and agrees with the plan for anticoagulation regarding his arrhythmia.  Objective:  Vital signs in last 24 hours: Vitals:   07/08/17 2357 07/09/17 0415 07/09/17 0420 07/09/17 0831  BP: (!) 165/101   128/90  Pulse: 88   83  Resp:      Temp:  98.2 F (36.8 C)    TempSrc:  Oral    SpO2:  98% 100%   Weight:      Height:       Physical Exam  Constitutional: He is oriented to person, place, and time. He appears well-developed and well-nourished. No distress.  HENT:  Head: Normocephalic and atraumatic.  Right Ear: External ear normal.  Left Ear: External ear normal.  Eyes: Pupils are equal, round, and reactive to light. Conjunctivae and EOM are normal. Right eye exhibits no discharge. Left eye exhibits no discharge. No scleral icterus.  Neck: Normal range of motion. Neck supple. No tracheal deviation present.  Cardiovascular: Normal rate.  Exam reveals no gallop and no friction rub.   No murmur heard. Irregularly irregular rhythm.  Pulmonary/Chest: Effort normal and breath sounds normal. No stridor. No respiratory distress. He has no wheezes. He has no rales.  Abdominal: Soft. He exhibits no distension. There is no tenderness.  Musculoskeletal:  Decreased ROM proximal bilateral legs. Strength 4+/5 RLE, 5/5 LLE, 5/5 RUE, 5/5 LUE.  Neurological: He is alert and oriented to person, place, and time. He displays normal reflexes. No cranial nerve deficit or sensory deficit. He exhibits normal muscle tone. Coordination normal.  Decreased sensation to light touch bilateral feet in patchy distribution. Sensation in heels, forefeet,  dorsal feet and lower legs intact.   Skin: Capillary refill takes less than 2 seconds. He is not diaphoretic.  Venous stasis ulcers noted over bilateral anterior shins.   Assessment/Plan:  Active Problems:   Pre-syncope  1. Headache with persistent RLE weakness on exam: Has mild persistent weakness of RLE with flexion/extension of lower leg as well as plantar/dorsiflexion of foot. Head CT negative but may have had small lacunar infarct in setting of uncontrolled HTN x 2-3 days to explain pure motor hemiparesis. Will also r/o embolism in setting of new arrhythmia. Does not have AMS or s/s of meningeal irritation, increased ICP. Low suspicion but ruling out with brain MRI.  - F/u brain MRI today  - Permissive HTN for now  - Strict BG 140-160 per stroke protocol. Lantus 25u QN and SSI 0-5 units per pharmacy. Received 15u of lantus last night and FBG 211 this AM. Currently will continue SSI 0-9 units until starting increase dose tonight.   2. Paroxysmal atrial fibrillation: Trops plateaued at 0.15 and now decreasing. Likely transient demand ischemia in setting of new/intermittent a-flutter/a-fib. Other causes mostly ruled out at this point. HDS but HR still high 90s. On heparin for bridge per cards, per pharmacy he is a candidate for Eliquis 5 mg BID given GFR of approx 50. Cardiology following and appreciate further recs. Per pharmacy there is a relatively low risk of hemorrhagic transformation with eliquis so can continue for now.  - Continue monitoring on tele   - Continue coreg 6.25 mg PO BID  for rate control/prevention of RVR  - s/p IV heparin bolus and heparin gtt for bridge - Will switch to Eliquis 5 mg BID, start today  - Hold IV lopressor 5 mg prn for a-fib with RVR   3. HTN: Was previously 170s/110s, started low dose coreg for AV nodal blockade/prevention of RVR and planned to restart home amlodipine and clonidine. However responded to 6.25 mg coreg early this morning before rounds with  SBP in 120s. Now doing permissive HTN for stroke w/u and will restart amlodipine and clonidine after brain MRI.  - Continue coreg 6.25 mg PO BID as above  - Hold home norvasc 10 mg PO qd - permissive HTN  - Hold home clonidine 0.1 mg bid - permissive HTN  - Hold IV lopressor 5 mg q68min PRN for SBP > 180 (up to 2 doses)   4. Orthostatic vitals: +Orthostatic vital signs. S/p 1L NS bolus. Not volume overloaded and per cardiology biggest contributor to current cardiac dysfunction with elevated trops is likely demand ischemia in setting of new arrhythmia. Should be able to tolerate fluids. Also needs fluids after CTPE with contrast in setting of underlying CKD III. - Continue mIVF NS 75 mL/hr  - Repeat orthostatic vitals --> still orthostatic.  Continue IVF for now  5. Uncontrolled t2dm: Poor baseline control. Patient believes he takes lantus 50u BID. FBS around 200s. Repeat A1c 8.1. Start lantus 15u qn + 0-9 units SSI-S.  - Change to Lantus 25u qn + 0-5u SSI starting tonight   6. CKD III: Cr is 1.68, prior Cr between 1.4 - 1.6. Not on ace/arb due to h/o angioedema. Received mIVF s/p IV contrast for CTPE.   7. HLD: 10-year ASCVD risk 59%. - Continue atorvastatin 40 mg qd  8. Tobacco abuse: Will discuss cessation with patient. - Nicoderm patch 14 mg qd   9. EtOH abuse: On CIWA, has not needed ativan.  - Multivitamin, thiamine, folic acid supplements   10. Homelessness:  - Will consult SW  11. Mild non-obstructive CAD: Per cath done in 06/2016. Low evidence to support daily asa 81 mg in non-obstructive CAD, also has elevated bleeding risk given daily eliquis.   Dispo: Anticipated discharge in approximately 1 day(s).   Lethea Killings, Medical Student 07/09/2017, 10:32 AM Pager: [phone]  Attestation for Student Documentation:  I personally was present and performed or re-performed the history, physical exam and medical decision-making activities of this service and have  verified that the service and findings are accurately documented in the student's note.  Jule Ser, DO 07/09/2017, 1:54 PM

## 2017-07-09 NOTE — Progress Notes (Addendum)
DAILY PROGRESS NOTE   Patient Name: Brad [name] Date of Encounter: 07/09/2017  Hospital Problem List   Principal Problem:   Pre-syncope Active Problems:   History of drug abuse   Elevated troponin   Chest pain   Essential hypertension   Uncontrolled diabetes mellitus with diabetic nephropathy, with long-term current use of insulin (HCC)   AKI (acute kidney injury) ([place])   Homelessness   Substance induced mood disorder (HCC)   Acute on chronic combined systolic and diastolic CHF (congestive heart failure) ([place])   Atrial flutter ([place])    Chief Complaint   Headache overnight   Subjective   Repeat CT head negative. BP better controlled today after re-establishing medicines. No chest pain. Troponin flat elevated, not as likely to be STEMI. May be related to CKD - creatinine 1.68. Back in sinus rhythm today.  Objective   Vitals:   07/08/17 2357 07/09/17 0415 07/09/17 0420 07/09/17 0831  BP: (!) 165/101   128/90  Pulse: 88   83  Resp:      Temp:  98.2 F (36.8 C)    TempSrc:  Oral    SpO2:  98% 100%   Weight:      Height:        Intake/Output Summary (Last 24 hours) at 07/09/17 1104 Last data filed at 07/09/17 0906  Gross per 24 hour  Intake             2054 ml  Output              980 ml  Net             1074 ml   Filed Weights   07/08/17 0336 07/08/17 1700  Weight: 200 lb (90.7 kg) 192 lb 14.4 oz (87.5 kg)    Physical Exam   General appearance: alert and no distress Lungs: clear to auscultation bilaterally Heart: regular rate and rhythm Extremities: extremities normal, atraumatic, no cyanosis or edema Neurologic: Grossly normal  Inpatient Medications    Scheduled Meds: . amitriptyline  75 mg Oral QHS  . amLODipine  10 mg Oral Daily  . apixaban  5 mg Oral BID  . atorvastatin  40 mg Oral Daily  . carvedilol  6.25 mg Oral BID WC  . cloNIDine  0.1 mg Oral BID  . folic acid  1 mg Oral Daily  . insulin aspart  0-9 Units Subcutaneous TID WC  .  insulin glargine  15 Units Subcutaneous QHS  . multivitamin with minerals  1 tablet Oral Daily  . nicotine  14 mg Transdermal Daily  . pantoprazole  40 mg Oral Daily  . [START ON 07/10/2017] pneumococcal 23 valent vaccine  0.5 mL Intramuscular Tomorrow-1000  . thiamine  100 mg Oral Daily   Or  . thiamine  100 mg Intravenous Daily    Continuous Infusions: . sodium chloride 75 mL/hr at 07/08/17 1949    PRN Meds: acetaminophen, metoprolol tartrate   Labs   Results for orders placed or performed during the hospital encounter of 07/08/17 (from the past 48 hour(s))  CBC WITH DIFFERENTIAL     Status: Abnormal   Collection Time: 07/08/17  6:59 AM  Result Value Ref Range   WBC 4.2 4.0 - 10.5 K/uL   RBC 4.28 4.22 - 5.81 MIL/uL   Hemoglobin 14.4 13.0 - 17.0 g/dL   HCT 40.3 39.0 - 52.0 %   MCV 94.2 78.0 - 100.0 fL   MCH 33.6 26.0 - 34.0 pg  MCHC 35.7 30.0 - 36.0 g/dL   RDW 13.0 11.5 - 15.5 %   Platelets 129 (L) 150 - 400 K/uL   Neutrophils Relative % 52 %   Neutro Abs 2.3 1.7 - 7.7 K/uL   Lymphocytes Relative 35 %   Lymphs Abs 1.5 0.7 - 4.0 K/uL   Monocytes Relative 10 %   Monocytes Absolute 0.4 0.1 - 1.0 K/uL   Eosinophils Relative 2 %   Eosinophils Absolute 0.1 0.0 - 0.7 K/uL   Basophils Relative 1 %   Basophils Absolute 0.0 0.0 - 0.1 K/uL  Comprehensive metabolic panel     Status: Abnormal   Collection Time: 07/08/17  6:59 AM  Result Value Ref Range   Sodium 136 135 - 145 mmol/L   Potassium 3.4 (L) 3.5 - 5.1 mmol/L   Chloride 103 101 - 111 mmol/L   CO2 21 (L) 22 - 32 mmol/L   Glucose, Bld 269 (H) 65 - 99 mg/dL   BUN 13 6 - 20 mg/dL   Creatinine, Ser 1.73 (H) 0.61 - 1.24 mg/dL   Calcium 8.6 (L) 8.9 - 10.3 mg/dL   Total Protein 8.5 (H) 6.5 - 8.1 g/dL   Albumin 3.6 3.5 - 5.0 g/dL   AST 69 (H) 15 - 41 U/L   ALT 49 17 - 63 U/L   Alkaline Phosphatase 81 38 - 126 U/L   Total Bilirubin 1.2 0.3 - 1.2 mg/dL   GFR calc non Af Amer 42 (L) >60 mL/min   GFR calc Af Amer 49 (L)  >60 mL/min    Comment: (NOTE) The eGFR has been calculated using the CKD EPI equation. This calculation has not been validated in all clinical situations. eGFR's persistently <60 mL/min signify possible Chronic Kidney Disease.    Anion gap 12 5 - 15  CBG monitoring, ED     Status: Abnormal   Collection Time: 07/08/17  7:08 AM  Result Value Ref Range   Glucose-Capillary 254 (H) 65 - 99 mg/dL  I-stat chem 8, ED     Status: Abnormal   Collection Time: 07/08/17  7:14 AM  Result Value Ref Range   Sodium 140 135 - 145 mmol/L   Potassium 3.5 3.5 - 5.1 mmol/L   Chloride 102 101 - 111 mmol/L   BUN 15 6 - 20 mg/dL   Creatinine, Ser 1.60 (H) 0.61 - 1.24 mg/dL   Glucose, Bld 270 (H) 65 - 99 mg/dL   Calcium, Ion 1.02 (L) 1.15 - 1.40 mmol/L   TCO2 24 0 - 100 mmol/L   Hemoglobin 14.6 13.0 - 17.0 g/dL   HCT 43.0 39.0 - 52.0 %  I-stat troponin, ED     Status: Abnormal   Collection Time: 07/08/17  7:30 AM  Result Value Ref Range   Troponin i, poc 0.13 (HH) 0.00 - 0.08 ng/mL   Comment NOTIFIED PHYSICIAN    Comment 3            Comment: Due to the release kinetics of cTnI, a negative result within the first hours of the onset of symptoms does not rule out myocardial infarction with certainty. If myocardial infarction is still suspected, repeat the test at appropriate intervals.   Ethanol     Status: None   Collection Time: 07/08/17  7:47 AM  Result Value Ref Range   Alcohol, Ethyl (B) <5 <5 mg/dL    Comment:        LOWEST DETECTABLE LIMIT FOR SERUM ALCOHOL IS 5 mg/dL FOR MEDICAL  PURPOSES ONLY   Brain natriuretic peptide     Status: Abnormal   Collection Time: 07/08/17  7:48 AM  Result Value Ref Range   B Natriuretic Peptide 308.6 (H) 0.0 - 100.0 pg/mL  Rapid urine drug screen (hospital performed)     Status: None   Collection Time: 07/08/17  7:50 AM  Result Value Ref Range   Opiates NONE DETECTED NONE DETECTED   Cocaine NONE DETECTED NONE DETECTED   Benzodiazepines NONE DETECTED  NONE DETECTED   Amphetamines NONE DETECTED NONE DETECTED   Tetrahydrocannabinol NONE DETECTED NONE DETECTED   Barbiturates NONE DETECTED NONE DETECTED    Comment:        DRUG SCREEN FOR MEDICAL PURPOSES ONLY.  IF CONFIRMATION IS NEEDED FOR ANY PURPOSE, NOTIFY LAB WITHIN 5 DAYS.        LOWEST DETECTABLE LIMITS FOR URINE DRUG SCREEN Drug Class       Cutoff (ng/mL) Amphetamine      1000 Barbiturate      200 Benzodiazepine   299 Tricyclics       242 Opiates          300 Cocaine          300 THC              50   TSH     Status: None   Collection Time: 07/08/17  1:09 PM  Result Value Ref Range   TSH 1.560 0.350 - 4.500 uIU/mL    Comment: Performed by a 3rd Generation assay with a functional sensitivity of <=0.01 uIU/mL.  Troponin I     Status: Abnormal   Collection Time: 07/08/17  2:12 PM  Result Value Ref Range   Troponin I 0.15 (HH) <0.03 ng/mL    Comment: CRITICAL RESULT CALLED TO, READ BACK BY AND VERIFIED WITH: B SHANAS,RN 1621 07/08/2017 WBOND   Magnesium     Status: None   Collection Time: 07/08/17  2:12 PM  Result Value Ref Range   Magnesium 1.7 1.7 - 2.4 mg/dL  Glucose, capillary     Status: Abnormal   Collection Time: 07/08/17  5:52 PM  Result Value Ref Range   Glucose-Capillary 264 (H) 65 - 99 mg/dL   Comment 1 Notify RN    Comment 2 Document in Chart   HIV antibody (Routine Testing)     Status: None   Collection Time: 07/08/17  6:40 PM  Result Value Ref Range   HIV Screen 4th Generation wRfx Non Reactive Non Reactive    Comment: (NOTE) Performed At: [facility] [place], [place] [id number] Lindon Romp MD [id number]   Hemoglobin A1c     Status: Abnormal   Collection Time: 07/08/17  6:40 PM  Result Value Ref Range   Hgb A1c MFr Bld 8.1 (H) 4.8 - 5.6 %    Comment: (NOTE)         Pre-diabetes: 5.7 - 6.4         Diabetes: >6.4         Glycemic control for adults with diabetes: <7.0    Mean Plasma Glucose 186 mg/dL     Comment: (NOTE) Performed At: [facility] [place], [place] [id number] Lindon Romp MD EH:[id number]   Troponin I (q 6hr x 3)     Status: Abnormal   Collection Time: 07/08/17  6:40 PM  Result Value Ref Range   Troponin I 0.15 (HH) <0.03 ng/mL    Comment: CRITICAL VALUE NOTED.  VALUE IS CONSISTENT WITH PREVIOUSLY REPORTED AND CALLED VALUE.  Glucose, capillary     Status: Abnormal   Collection Time: 07/08/17  9:28 PM  Result Value Ref Range   Glucose-Capillary 213 (H) 65 - 99 mg/dL   Comment 1 Notify RN    Comment 2 Document in Chart   Heparin level (unfractionated)     Status: Abnormal   Collection Time: 07/08/17  9:57 PM  Result Value Ref Range   Heparin Unfractionated 0.29 (L) 0.30 - 0.70 IU/mL    Comment:        IF HEPARIN RESULTS ARE BELOW EXPECTED VALUES, AND PATIENT DOSAGE HAS BEEN CONFIRMED, SUGGEST FOLLOW UP TESTING OF ANTITHROMBIN III LEVELS.   Troponin I (q 6hr x 3)     Status: Abnormal   Collection Time: 07/09/17 12:48 AM  Result Value Ref Range   Troponin I 0.14 (HH) <0.03 ng/mL    Comment: CRITICAL VALUE NOTED.  VALUE IS CONSISTENT WITH PREVIOUSLY REPORTED AND CALLED VALUE.  Heparin level (unfractionated)     Status: Abnormal   Collection Time: 07/09/17  4:43 AM  Result Value Ref Range   Heparin Unfractionated 0.24 (L) 0.30 - 0.70 IU/mL    Comment:        IF HEPARIN RESULTS ARE BELOW EXPECTED VALUES, AND PATIENT DOSAGE HAS BEEN CONFIRMED, SUGGEST FOLLOW UP TESTING OF ANTITHROMBIN III LEVELS.   Basic metabolic panel Once     Status: Abnormal   Collection Time: 07/09/17  4:43 AM  Result Value Ref Range   Sodium 134 (L) 135 - 145 mmol/L   Potassium 3.0 (L) 3.5 - 5.1 mmol/L   Chloride 99 (L) 101 - 111 mmol/L   CO2 27 22 - 32 mmol/L   Glucose, Bld 222 (H) 65 - 99 mg/dL   BUN 16 6 - 20 mg/dL   Creatinine, Ser 1.68 (H) 0.61 - 1.24 mg/dL   Calcium 8.3 (L) 8.9 - 10.3 mg/dL   GFR calc non Af Amer 44 (L) >60 mL/min   GFR calc Af Amer  51 (L) >60 mL/min    Comment: (NOTE) The eGFR has been calculated using the CKD EPI equation. This calculation has not been validated in all clinical situations. eGFR's persistently <60 mL/min signify possible Chronic Kidney Disease.    Anion gap 8 5 - 15  Troponin I (q 6hr x 3)     Status: Abnormal   Collection Time: 07/09/17  4:43 AM  Result Value Ref Range   Troponin I 0.15 (HH) <0.03 ng/mL    Comment: CRITICAL VALUE NOTED.  VALUE IS CONSISTENT WITH PREVIOUSLY REPORTED AND CALLED VALUE.  CBC     Status: Abnormal   Collection Time: 07/09/17  4:43 AM  Result Value Ref Range   WBC 5.5 4.0 - 10.5 K/uL   RBC 4.02 (L) 4.22 - 5.81 MIL/uL   Hemoglobin 13.5 13.0 - 17.0 g/dL   HCT 38.1 (L) 39.0 - 52.0 %   MCV 94.8 78.0 - 100.0 fL   MCH 33.6 26.0 - 34.0 pg   MCHC 35.4 30.0 - 36.0 g/dL   RDW 12.9 11.5 - 15.5 %   Platelets 135 (L) 150 - 400 K/uL  Glucose, capillary     Status: Abnormal   Collection Time: 07/09/17  8:17 AM  Result Value Ref Range   Glucose-Capillary 211 (H) 65 - 99 mg/dL   Comment 1 Notify RN     ECG   Sinus with 1st degree AVB, PVC's - Personally Reviewed  Telemetry  Sinus rhythm - Personally Reviewed  Radiology    Ct Head Wo Contrast  Result Date: 07/09/2017 CLINICAL DATA:  Fall with head trauma. EXAM: CT HEAD WITHOUT CONTRAST TECHNIQUE: Contiguous axial images were obtained from the base of the skull through the vertex without intravenous contrast. COMPARISON:  Head CT 07/07/2017 FINDINGS: Brain: No mass lesion, intraparenchymal hemorrhage or extra-axial collection. No evidence of acute cortical infarct. Mild periventricular hypoattenuation. Vascular: No hyperdense vessel or unexpected calcification. Skull: Normal visualized skull base, calvarium and extracranial soft tissues. Sinuses/Orbits: No sinus fluid levels or advanced mucosal thickening. No mastoid effusion. Normal orbits. IMPRESSION: Unchanged examination without acute intracranial abnormality.  Electronically Signed   By: Ulyses Jarred M.D.   On: 07/09/2017 02:06   Ct Head Wo Contrast  Result Date: 07/07/2017 CLINICAL DATA:  Initial evaluation for acute blurry vision, weakness. EXAM: CT HEAD WITHOUT CONTRAST TECHNIQUE: Contiguous axial images were obtained from the base of the skull through the vertex without intravenous contrast. COMPARISON:  Prior CT from 10/06/2016. FINDINGS: Brain: Mild age-related cerebral atrophy with chronic small vessel ischemic disease. No acute intracranial hemorrhage. No evidence for acute large vessel territory infarct. No mass lesion, midline shift or mass effect. No hydrocephalus. No extra-axial fluid collection. Vascular: No hyperdense vessel. Scattered vascular calcifications noted within the carotid siphons. Skull: Scalp soft tissues and calvarium within normal limits. Sinuses/Orbits: Globes and orbital soft tissues within normal limits. Paranasal sinuses and mastoid air cells are clear. IMPRESSION: 1. No acute intracranial process. 2. Mild age-related cerebral atrophy with chronic small vessel ischemic disease. Electronically Signed   By: Jeannine Boga M.D.   On: 07/07/2017 19:01   Ct Angio Chest Pe W Or Wo Contrast  Result Date: 07/08/2017 CLINICAL DATA:  Dizziness and fall 2 days ago. History of blood clots and CHF. EXAM: CT ANGIOGRAPHY CHEST WITH CONTRAST TECHNIQUE: Multidetector CT imaging of the chest was performed using the standard protocol during bolus administration of intravenous contrast. Multiplanar CT image reconstructions and MIPs were obtained to evaluate the vascular anatomy. CONTRAST:  100 cc Isovue 370 intravenous COMPARISON:  08/11/2016 FINDINGS: Cardiovascular: Satisfactory opacification of the pulmonary arteries to the segmental level. No evidence of pulmonary embolism. Cardiomegaly. Contrast timing results in no opacification of the left heart and systemic arterial tree. Atherosclerotic calcifications seen in the left coronary  circulation and aorta. Mediastinum/Nodes: Negative for adenopathy or mass. Lungs/Pleura: There is no edema, consolidation, effusion, or pneumothorax. Upper Abdomen: Limited coverage without acute finding. Musculoskeletal: No acute or aggressive finding. Remote anterior left rib fractures that appear healed. Review of the MIP images confirms the above findings. IMPRESSION: 1. Negative for pulmonary embolism or other acute finding. 2. Cardiomegaly. 3. Aortic Atherosclerosis (ICD10-I70.0).  Coronary atherosclerosis. Electronically Signed   By: Monte Fantasia M.D.   On: 07/08/2017 14:38   Dg Chest Port 1 View  Result Date: 07/08/2017 CLINICAL DATA:  Shortness of breast since yesterday, chest pain, weakness, atrial flutter, diabetes mellitus, hypertension, smoker, stage III chronic kidney disease, chronic diastolic CHF EXAM: PORTABLE CHEST 1 VIEW COMPARISON:  Portable exam 0808 hours compared to 09/18/2016 FINDINGS: Lordotic positioning. Enlargement of cardiac silhouette with mild pulmonary vascular cephalization. Mediastinal contours normal. Lungs clear. No pleural effusion or pneumothorax. No acute osseous findings. IMPRESSION: Enlargement of cardiac silhouette with mild pulmonary vascular congestion. No acute infiltrate. Electronically Signed   By: Lavonia Dana M.D.   On: 07/08/2017 08:27    Cardiac Studies   N/A  Assessment   Principal Problem:   Pre-syncope Active  Problems:   History of drug abuse   Elevated troponin   Chest pain   Essential hypertension   Uncontrolled diabetes mellitus with diabetic nephropathy, with long-term current use of insulin (HCC)   AKI (acute kidney injury) ([place])   Homelessness   Substance induced mood disorder (HCC)   Acute on chronic combined systolic and diastolic CHF (congestive heart failure) (HCC)   Atrial flutter ([place])   Plan   1. No further chest pain - troponin flat elevated, more consistent with CHF or CKD. Now in sinus rhythm today.. Will repeat  echo as an outpatient. Ok to stop heparin. Transition to Eliquis 5 mg BID - will need case management assistance for coverage - he does have medicaid. Diuresed about 400 cc yesterday - breathing is okay today. BP better on meds. Appreciate medicine assistance.   Time Spent Directly with Patient:  I have spent a total of 15 minutes with the patient reviewing hospital notes, telemetry, EKGs, labs and examining the patient as well as establishing an assessment and plan that was discussed personally with the patient. > 50% of time was spent in direct patient care.  Length of Stay:  LOS: 0 days   [name], MD, [place]  Attending Cardiologist  Direct Dial: [phone]  Fax: [phone]  Website:  [email or web address] 07/09/2017, 11:04 AM

## 2017-07-09 NOTE — Progress Notes (Signed)
Paged by RN that patient has complaints of 10/10 headache. Received Tylenol earlier this evening with no relief.  Patient here following syncopal episode on 7/16 during which he hit his head. He was seen in the ED on 7/17 with negative work up at that time including negative CT head. He apparently never left the ED waiting room after discharge and came back after feeling hot and short of breath. He was found to be in new A. Fib with complaints of chest pain at that time and admitted. He was started on heparin gtt for the new arrhythmia. No nitro given for his chest pain.  He reports to me that he has been having a 10/10 headache since his fall 2 days ago. He describes it as a throbbing pain across his left forehead. On exam, patient is resting comfortably in bed in no acute distress. His neuro exam is remarkable for 3+/5 strength in right lower extremity and reported decreased sensation to his right lower extremity. Otherwise, 5/5 strength and normal sensation throughout. CN II-XII intact. He does report that the decreased sensation is old but the weakness in his right leg is new. Lungs CTAB. Heart RRR. He denies any shortness of breath or chest pain currently.  Review of his telemetry shows NSR since admission. Vital signs are stable and only notable for hypertension. Chart review shows no neuro deficits from his 7/17 ED visit through to admission this morning. Trops flat at 0.15.   Suspect his headache is 2/2 his fall but given his new neuro deficits while being on heparin gtt, will get repeat head CT and H/H to rule out brain bleed. Increase Tylenol from 650 mg to 1000 mg q6hr prn. Follow up imaging and labs.

## 2017-07-09 NOTE — Progress Notes (Signed)
Head CT negative for any acute bleed. Will continue Tylenol prn for headache. Unable to give NSAIDs given patient's CKD.

## 2017-07-09 NOTE — Discharge Summary (Signed)
Name: Brad [name] MRN: [id number] DOB: 1961/03/20 56 y.o. PCP: Arnoldo Morale, MD  Date of Admission: 07/08/2017  5:39 AM Date of Discharge: 07/10/2017 Attending Physician: No att. providers found  Discharge Diagnosis: Principal Problem:   Pre-syncope Active Problems:   History of drug abuse   Elevated troponin   Chest pain   Essential hypertension   Uncontrolled diabetes mellitus with diabetic nephropathy, with long-term current use of insulin (HCC)   AKI (acute kidney injury) ([place])   Homelessness   Substance induced mood disorder ([place])   Acute on chronic combined systolic and diastolic CHF (congestive heart failure) ([place])   Atrial flutter ([place])   Discharge Medications: Allergies as of 07/09/2017      Reactions   Lisinopril Anaphylaxis   Throat swelling   Angiotensin Receptor Blockers Other (See Comments)   (Angioedema with lisinopril, therefore ARB's are contraindicated)   Pamelor [nortriptyline Hcl] Swelling      Medication List    STOP taking these medications   cloNIDine 0.1 MG tablet Commonly known as:  CATAPRES     TAKE these medications   ACCU-CHEK AVIVA device Use 3 times daily before meals   ACCU-CHEK AVIVA PLUS w/Device Kit 1 each by Does not apply route 3 (three) times daily.   accu-chek softclix lancets Use as instructed daily.   accu-chek softclix lancets Use as instructed   amitriptyline 75 MG tablet Commonly known as:  ELAVIL Take 75 mg by mouth at bedtime.   amLODipine 10 MG tablet Commonly known as:  NORVASC Take 1 tablet (10 mg total) by mouth daily.   apixaban 5 MG Tabs tablet Commonly known as:  ELIQUIS Take 1 tablet (5 mg total) by mouth 2 (two) times daily.   atorvastatin 20 MG tablet Commonly known as:  LIPITOR Take 2 tablets (40 mg total) by mouth daily.   carvedilol 6.25 MG tablet Commonly known as:  COREG Take 1 tablet (6.25 mg total) by mouth 2 (two) times daily with a meal.   gabapentin 300 MG capsule Commonly  known as:  NEURONTIN Take 1 capsule (300 mg total) by mouth 3 (three) times daily.   glucose blood test strip Commonly known as:  ACCU-CHEK AVIVA Use 3 times daily before meals   glucose blood test strip Commonly known as:  ACCU-CHEK AVIVA Use as instructed   hydrocortisone 2.5 % cream Apply topically 2 (two) times daily.   Insulin Glargine 100 UNIT/ML Solostar Pen Commonly known as:  LANTUS SOLOSTAR Inject 57 Units into the skin daily at 10 pm. What changed:  how much to take  when to take this   Insulin Pen Needle 31G X 8 MM Misc Use as directed   omeprazole 20 MG capsule Commonly known as:  PRILOSEC Take 1 capsule (20 mg total) by mouth daily.       Disposition and follow-up:   Brad [name] was discharged from [facility] in Stable condition.  At the hospital follow up visit please address:  1.  Whether he is continuing to take his home eliquis   2.  Labs / imaging needed at time of follow-up: Recheck BMP to assess potassium levels and renal function   3.  Pending labs/ test needing follow-up: None   Follow-up Appointments: Follow-up Information    [place]. Go on 07/15/2017.   Why:  at 9:45am for an appointment with Dr Lisette Grinder information: [phone]  [name], MD. Call.   Specialty:  Cardiology Why:  to schedule a hospital follow up appointment. Contact information: [place] [address] [phone]          Hospital Course by problem list: Principal Problem:   Pre-syncope Active Problems:   History of drug abuse   Elevated troponin   Chest pain   Essential hypertension   Uncontrolled diabetes mellitus with diabetic nephropathy, with long-term current use of insulin (HCC)   AKI (acute kidney injury) ([place])   Homelessness   Substance induced mood disorder (HCC)   Acute on chronic  combined systolic and diastolic CHF (congestive heart failure) (HCC)   Atrial flutter (HCC)   New Onset Paroxysmal Atrial Flutter: Presented with chest pain and dyspnea on exertion that had been progressively getting worse over the course of one week. Had mildly elevated troponins at presentation of 0.13 that peaked at 0.15 and downtrended. Prior baseline was known to be around 0.06-0.08 in setting of CKD 3/CHF. He had evidence of both atrial flutter and atrial fibrillation on separate EKGs in the ED in a prior admission 1-2 days before, but had converted back to NSR during this admission. After extensive rule out of other causes including ACS, COPD exacerbation, PE, CHF exacerbation, he was determined to have demand ischemia induced by new arrhythmia as the most likely cause of his mild troponin elevations. During this admission he continued to be NSR and was rate controlled with heart rate in the 90s. He was initially bridged on heparin for start warfarin but was instead changed to eliquis for long-term anticoagulation after confirmation with pharmacy that his GFR of 50 was sufficient. He was never hemodynamically unstable and did not require cardioversion. He was started on a low dose of coreg 6.25 mg BID for further rate control and also for some assistance with his HTN. He was not restarted on his home clonidine in the setting of relatively good BP control with just low dose coreg and norvasc.    Syncope and Fall: Patient initially presented to the ED after an episode of syncope two days prior that caused him to lose consciousness and hit his head. Head CT in ED was negative and was discharged. He presented again to the ED the next day with complaint of continued head and neck pain. In the context of persistent RLE weakness (as below), he underwent repeat head CT and brain MRI again, which did not show any evidence of soft tissue or bony injury. Per patient report the morning of the syncope he had had poor  PO intake and only had some beer that morning. He also endorsed symptoms c/w vasovagal syncope. Finally, he had a history of persistent orthostasis as noted below and had multiple confirmations of this on prior admissions. Recommend follow-up with PCP.   Orthostatic Vitals: Patient presented with orthostatic vital signs that were confirmed with repeat measurements despite normal diet and fluid boluses. This had been a chronic and ongoing issue with him. Did not start any meds in this hospitalization.  RLE Weakness: Presented with RLE weakness that started after patient's syncopal episode and fall. Head CT was negative and MRI did not show acute infarct.    HTN:  Presented with 170s/110s, had not taken home clonidine and amlodipine for a few days. His pressures improved to 140s with coreg that was started for rate control. He did not require medications for hypertensive crisis.    Uncontrolled T2DM: A1c was rechecked and  8.1 this admission. Patient said he took 57 units of lantus bid at home, we maintained at a dose of lantus 15u qn with SSI, his BG remained in the 200s but he did not stay long enough for [place] to adjust his insulin regimen and obtain better control. He was recommended to take lantus once a day per prior notes and to follow up with his PCP for adjustment.   CKD III: His creatinine remained at his baseline of approximately 1.6 throughout this admission.   HLD: His 10-year ASCVD risk was calculated to be 59%. Recommended PCP to start statin upon follow-up based on his insurance.  Tobacco abuse: Patient said he would quit smoking after this admission. He was maintained on Nicoderm patch 14 mg qd throughout this hospitalization. We were not able to send him home on nicoderm patch   EtOH abuse: Patient presented with alcohol level of 0 after it being 163 the day before. He was maintained on CIWA protocol but did not require ativan. He was given multivitamins, thiamine, and folic acid  supplements.   Homelessness: He was seen by social work within the hospital and had a discussion about possible shelters and places to go afterwards. Social work provided him with a list of shelters prior to discharge.   Mild non-obstructive CAD: He was determined to have mild non-obstructive CAD based on a prior left heart cath. Aspirin was determined to be too high risk in light of his existing anticoagulation with eliquis. He would also derive less benefit given the non-obstructive nature of his CAD.  Discharge Vitals:   BP (!) 148/86   Pulse 79   Temp 98.1 F (36.7 C) (Oral)   Resp 18   Ht '5\' 11"'  (1.803 m)   Wt 87.5 kg (192 lb 14.4 oz)   SpO2 99%   BMI 26.90 kg/m   Pertinent Labs, Studies, and Procedures: Lab Results  Component Value Date   NA 134 (L) 07/09/2017   K 3.0 (L) 07/09/2017   CL 99 (L) 07/09/2017   CO2 27 07/09/2017   Lab Results  Component Value Date   WBC 5.5 07/09/2017   HGB 13.5 07/09/2017   HCT 38.1 (L) 07/09/2017   MCV 94.8 07/09/2017   PLT 135 (L) 07/09/2017   Lab Results  Component Value Date   CREATININE 1.68 (H) 07/09/2017   CREATININE 1.60 (H) 07/08/2017   CREATININE 1.73 (H) 07/08/2017   Lab Results  Component Value Date   HGBA1C 8.1 (H) 07/08/2017    Discharge Instructions: Discharge Instructions    Diet - low sodium heart healthy    Complete by:  As directed    Discharge instructions    Complete by:  As directed    Brad [name], Brad [name] were admitted to the hospital and found to have an arrhythmia with your heart called Atrial Fibrillation.  We have started you on a couple of medications.  They are called Coreg and Eliquis.  Coreg is to help your blood pressure and control your heart rate.  Eliquis is to help thin your blood to help prevent having a stroke.  You will take these medications twice per day.  New prescriptions were sent to your pharmacy for these medications.  Please follow up with the providers listed on your discharge  paperwork.  You will need to call the cardiologist to schedule this appointments.  Please take your insulin as instructed on your paperwork.  Take Care.   Increase activity slowly    Complete  by:  As directed      Signed: Lethea Killings, Medical Student 07/10/2017, 8:36 AM   Pager: [phone]  Attestation for Student Documentation:  I personally was present and performed or re-performed the history, physical exam and medical decision-making activities of this service and have verified that the service and findings are accurately documented in the student's note.  Jule Ser, DO 07/13/2017, 7:22 AM

## 2017-07-09 NOTE — Progress Notes (Signed)
Pt complains of headache with pain scale 10/10, per pt Tylenol did not help at all, pt stated the headache started when he fell 2 days ago until now. Dr. Laurin Coder (on call) was notified. Will continue to monitor pt.

## 2017-07-09 NOTE — Progress Notes (Signed)
Date: 07/09/2017  Patient name: Brad [name]  Medical record number: [id number]  Date of birth: 1961-01-06   I have seen and evaluated Brad [name] and discussed their care with the Residency Team. In brief, patient is a 56 year old male with past medical history of nonischemic cardiomyopathy with an EF of 45-50%, hepatitis C, hypertension, diabetes, COPD stage III, recurrent orthostatic syncope and polysubstance abuse who presented to the ED with worsening chest pain and shortness of breath 1 day.  Patient states that approximately 2 days prior to admission he was working outside and became warm and diaphoretic and then passed out. Patient states that he woke up very quickly but notes that he did hit his head and sustained an abrasion over his left forearm. He came to the ED for follow-up of that time and had a negative head CT and was discharged home. Patient states that after leaving the ED he fell sleep at the bus stop and woke up feeling hot and developed shortness of breath and worsening chest pain as well as associated blurry vision. No fevers or chills, no abdominal pain, no nausea or vomiting, no diarrhea, no palpitations, no focal weakness, no tingling or numbness.  Patient states that overnight he developed a bad headache and also noted weakness in his right leg. He states that the weakness is improved but still feels weaker than his left.  PMHx, Fam Hx, and/or Soc Hx : As per resident admit note  Vitals:   07/09/17 0831 07/09/17 1312  BP: 128/90   Pulse: 83   Resp:  18  Temp:  98.1 F (36.7 C)   Gen.: Awake alert and oriented 3, NAD CVS: Regular rate and rhythm, normal heart sounds Lungs: CTA bilaterally Abdomen: Soft, nontender, nondistended, normoactive bowel sounds Extremities: No edema noted Neuro: Mild weakness in right lower extremity (4+) compared to left lower extremity (5). Power is 5 out of 5 in both upper extremities, sensation decreased over distal feet  bilaterally, cranial nerves II-12 grossly intact   Assessment and Plan: I have seen and evaluated the patient as outlined above. I agree with the formulated Assessment and Plan as detailed in the residents' note, with the following changes:   1. Chest pain: - Patient presented to the ED with some vague midsternal chest pain associated with shortness of breath. He was found to have a mildly elevated troponin and was seen by cardiology who started him on a heparin drip. He has had no further chest pain while here and this is unlikely to be an acute coronary event. It is likely that his mildly elevated troponins and a chest pain was secondary to a flutter which patient was noted to have on his EKG on his first visit to the ED. - A flutter resolves spontaneously. Patient has been in normal sinus rhythm since his admission - Troponins are likely secondary to demand ischemia from a flutter which is also likely etiology for his syncope - Patient will need repeat echo as an outpatient per cardiology - We'll DC heparin drip today and start patient on apixiban 5 mg twice a day for his a flutter. Patient will need case management assistance for coverage - Continue with carvedilol 6.25 mg orally twice a day for his a flutter  2. Right lower extremity weakness: - Patient is complained of new onset right lower extremity weakness while in the hospital. Initially the concern was for a possible intracranial hemorrhage given that he was on a heparin drip and was complaining of  a headache at the time. CT head done at that time showed no evidence of intracranial hemorrhage. He still complains of some mild persistent right lower extremity weakness even though this is improved from last night. - We'll obtain an MRI of his brain prior to discharge to rule out CVA  - If MRI is within normal limits patient stable for discharge home today on his home medications and apixiban.   Aldine Contes, MD 7/19/20181:46 PM

## 2017-07-09 NOTE — Discharge Instructions (Signed)

## 2017-07-09 NOTE — Progress Notes (Signed)
Spoke with MRI, will get patient to scan around noon as nursing staff has to attend radiology with pt.

## 2017-07-09 NOTE — Progress Notes (Signed)
Patient states that he does not use the pharmacy that the prescriptions were called to; is requesting they be re-emailed to the [facility] [address] Pharmacy updated in pt chart.   Page sent to MD to request re-order/re-send

## 2017-07-09 NOTE — Progress Notes (Signed)
PT Cancellation Note  Patient Details Name: Brad [name] MRN: [id number] DOB: [birth year]   Cancelled Treatment:    Reason Eval/Treat Not Completed: Patient at procedure or test/unavailable. Pt currently in MRI. Will check back later as time allows.    Scheryl Marten PT, DPT  [phone]  07/09/2017, 3:14 PM

## 2017-07-09 NOTE — Progress Notes (Signed)
Pt premedicated for MRI with 0.5mg  ativan IV

## 2017-07-09 NOTE — Progress Notes (Signed)
PT Cancellation Note  Patient Details Name: Brad [name] MRN: [id number] DOB: December 15, 1961   Cancelled Treatment:    Reason Eval/Treat Not Completed: PT screened, no needs identified, will sign off. Per RN, pt is independent and able to move without difficulty. All resources for DC have been set up and no evaluation is needed at this time.    Scheryl Marten PT, DPT  [phone]  07/09/2017, 4:27 PM

## 2017-07-09 NOTE — Progress Notes (Signed)
Seen and examined by Dr. Charlynn Grimes and Dr. Isac Sarna.

## 2017-07-09 NOTE — Progress Notes (Signed)
Pt refused a wheelchair upon discharge. Pt provided toiletries from room to take with him; as needs were expressed. Pt permitted to shower prior to d/c, due to same reason.  Pt ambulated independently.

## 2017-07-09 NOTE — Progress Notes (Signed)
Call placed to CCMD to notify of telemetry monitoring d/c.   

## 2017-07-09 NOTE — Progress Notes (Signed)
Inpatient Diabetes Program Recommendations  AACE/ADA: New Consensus Statement on Inpatient Glycemic Control (2015)  Target Ranges:  Prepandial:   less than 140 mg/dL      Peak postprandial:   less than 180 mg/dL (1-2 hours)      Critically ill patients:  140 - 180 mg/dL   Lab Results  Component Value Date   GLUCAP 211 (H) 07/09/2017   HGBA1C 8.1 (H) 07/08/2017    Review of Glycemic Control Results for Brad [name], Brad [name] (MRN [id number]) as of 07/09/2017 09:45  Ref. Range 07/07/2017 17:47 07/08/2017 07:08 07/08/2017 17:52 07/08/2017 21:28 07/09/2017 08:17  Glucose-Capillary Latest Ref Range: 65 - 99 mg/dL 220 (H) 254 (H) 264 (H) 213 (H) 211 (H)   Diabetes history: DM2 Outpatient Diabetes medications: Lantus 50 units bid Current orders for Inpatient glycemic control: Lantus 15 units + Novolog correction 0-9 units tid   Inpatient Diabetes Program Recommendations:  Noted A1c decreased from 10.29 Apr 2017 to 8.1 currently. Please consider: -Increase Lantus to 25 units and adjust as needed -Add Novolog correction 0-5 units hs to regimen Will follow.  Thank you, Brad [name]. Brad Disney, RN, MSN, CDE  Diabetes Coordinator Inpatient Glycemic Control Team Team Pager [phone] (8am-5pm) 07/09/2017 9:51 AM

## 2017-07-09 NOTE — Clinical Social Work Note (Signed)
Clinical Social Work Assessment  Patient Details  Name: Aniello Christopoulos MRN: [id number] Date of Birth: 18-Jan-1961  Date of referral:  07/09/17               Reason for consult:  Housing Concerns/Homelessness                Permission sought to share information with:    Permission granted to share information::  No  Name::        Agency::     Relationship::     Contact Information:     Housing/Transportation Living arrangements for the past 2 months:  Homeless Source of Information:  Patient, Medical Team Patient Interpreter Needed:    Criminal Activity/Legal Involvement Pertinent to Current Situation/Hospitalization:  No - Comment as needed Significant Relationships:  Friend Lives with:  Self Do you feel safe going back to the place where you live?  No Need for family participation in patient care:  Yes (Comment)  Care giving concerns:  Homelessness   Social Worker assessment / plan:  CSW met with patient. No supports at bedside. CSW introduced role and inquired about housing concerns. Patient stated that he has been living outside. CSW asked if he had looked into shelters. He stated that he had but [employer] "lies" and says they do not have any beds available. The patient stated he knows they lie because he has friends that stay there and "they say there are plenty of beds." CSW asked if he had looked into the [employer]. Patient said he had not done so. CSW provided shelter list and highlighted the shelters he qualifies for. Two are in [place] and two in [place]. Patient also agreeable to a list of local community resources. Patient declined substance abuse treatment center list, stating that he is not going to drink anymore. Patient declined booklets for free meals and food pantries in [place] because he said he already has them. No further concerns. CSW encouraged patient to contact CSW as needed. CSW signing off as social work intervention is no longer  needed.  Employment status:  Disabled (Comment on whether or not currently receiving Disability) Insurance information:  Medicaid In [place] PT Recommendations:  Not assessed at this time [place]) Information / Referral to community resources:  Shelter, Other (Comment Required)  Patient/Family's Response to care:  Patient agreeable to receiving some of the resources offered. Patient has a friend that is supportive and involved in patient's care. Patient appreciated social work intervention.  Patient/Family's Understanding of and Emotional Response to Diagnosis, Current Treatment, and Prognosis:  Patient has a good understanding of the reason for admission and the social work consult. Patient appears pleased with hospital care.  Emotional Assessment Appearance:  Appears stated age Attitude/Demeanor/Rapport:  Other (Pleasant) Affect (typically observed):  Accepting, Appropriate, Calm, Pleasant Orientation:  Oriented to Self, Oriented to Place, Oriented to  Time, Oriented to Situation Alcohol / Substance use:  Alcohol Use Psych involvement (Current and /or in the community):  No (Comment)  Discharge Needs  Concerns to be addressed:  Homelessness Readmission within the last 30 days:  No Current discharge risk:  None Barriers to Discharge:  Continued Medical Work up   Candie Chroman, LCSW 07/09/2017, 10:42 AM

## 2017-07-09 NOTE — Progress Notes (Signed)
Page to MD for callback; to  Request ativan rx for MRI. No PRN orders, even though pt is also CIWA q6

## 2017-07-10 ENCOUNTER — Telehealth: Payer: Self-pay

## 2017-07-10 NOTE — Telephone Encounter (Signed)
[facility] Post-discharge Follow-Up Phone Call:  Date of Discharge: 07/09/2017 Principal Discharge Diagnosis(es): pre-syncope, atrial flutter Post-discharge Communication: (Clearly document all attempts clearly and date contact made)  The patient is homeless and does not have a phone. Call placed to his contact - Mr Ace Gins who said that the patient is not with him but he hopes to see him in the next day or 2.  Requested that he have the patient return the call to this CM # [phone] and Mr Ace Gins said that he would tell him.  Call Completed: No

## 2017-07-13 ENCOUNTER — Telehealth: Payer: Self-pay

## 2017-07-13 ENCOUNTER — Encounter: Payer: Self-pay | Admitting: Pediatric Intensive Care

## 2017-07-13 NOTE — Telephone Encounter (Signed)
[facility]itional [facility] Clinic Post-discharge Follow-Up Phone Call:  Date of Discharge: 07/09/17 Principal Discharge Diagnosis(es): atrial flutter,pre-syncope Post-discharge Communication: (Clearly document all attempts clearly and date contact made)  - unable to contact the patient as he does not have a phone. This CM spoke to his contact - Mr Ace Gins, on 07/10/17 and he said that he would notify the patient of the need to call this CM. Call Completed: no

## 2017-07-15 ENCOUNTER — Inpatient Hospital Stay: Payer: Self-pay | Admitting: Family Medicine

## 2017-07-17 ENCOUNTER — Telehealth: Payer: Self-pay

## 2017-07-17 NOTE — Telephone Encounter (Signed)
Attempted to contact the patient again to discuss follow up medical care. He has no phone. Call placed to his contact, Mr Ace Gins, who stated that he saw the patient last weekend and gave him the message from this CM to to call [facility].  He noted that the patient is staying in a shelter. He said he would give the patient another message to call this CM when he sees him again

## 2017-07-26 ENCOUNTER — Emergency Department (HOSPITAL_COMMUNITY): Payer: Medicaid Other

## 2017-07-26 ENCOUNTER — Emergency Department (HOSPITAL_COMMUNITY)
Admission: EM | Admit: 2017-07-26 | Discharge: 2017-07-27 | Disposition: A | Discharge status: Home / Self Care | Payer: Medicaid Other | Attending: Emergency Medicine | Admitting: Emergency Medicine

## 2017-07-26 ENCOUNTER — Encounter (HOSPITAL_COMMUNITY): Payer: Self-pay | Admitting: Emergency Medicine

## 2017-07-26 ENCOUNTER — Other Ambulatory Visit: Payer: Self-pay

## 2017-07-26 DIAGNOSIS — I130 Hypertensive heart and chronic kidney disease with heart failure and stage 1 through stage 4 chronic kidney disease, or unspecified chronic kidney disease: Secondary | ICD-10-CM | POA: Insufficient documentation

## 2017-07-26 DIAGNOSIS — N183 Chronic kidney disease, stage 3 (moderate): Secondary | ICD-10-CM | POA: Diagnosis not present

## 2017-07-26 DIAGNOSIS — Z79899 Other long term (current) drug therapy: Secondary | ICD-10-CM | POA: Diagnosis not present

## 2017-07-26 DIAGNOSIS — I5032 Chronic diastolic (congestive) heart failure: Secondary | ICD-10-CM | POA: Diagnosis not present

## 2017-07-26 DIAGNOSIS — Z794 Long term (current) use of insulin: Secondary | ICD-10-CM | POA: Insufficient documentation

## 2017-07-26 DIAGNOSIS — E1122 Type 2 diabetes mellitus with diabetic chronic kidney disease: Secondary | ICD-10-CM | POA: Diagnosis not present

## 2017-07-26 DIAGNOSIS — F17210 Nicotine dependence, cigarettes, uncomplicated: Secondary | ICD-10-CM | POA: Insufficient documentation

## 2017-07-26 DIAGNOSIS — R0789 Other chest pain: Secondary | ICD-10-CM | POA: Diagnosis not present

## 2017-07-26 DIAGNOSIS — Z7901 Long term (current) use of anticoagulants: Secondary | ICD-10-CM | POA: Insufficient documentation

## 2017-07-26 DIAGNOSIS — Z96652 Presence of left artificial knee joint: Secondary | ICD-10-CM | POA: Insufficient documentation

## 2017-07-26 DIAGNOSIS — Z8546 Personal history of malignant neoplasm of prostate: Secondary | ICD-10-CM | POA: Diagnosis not present

## 2017-07-26 DIAGNOSIS — R079 Chest pain, unspecified: Secondary | ICD-10-CM | POA: Diagnosis present

## 2017-07-26 LAB — I-STAT TROPONIN, ED: Troponin i, poc: 0.08 ng/mL (ref 0.00–0.08)

## 2017-07-26 LAB — BASIC METABOLIC PANEL
Anion gap: 11 (ref 5–15)
BUN: 16 mg/dL (ref 6–20)
CO2: 26 mmol/L (ref 22–32)
Calcium: 8.9 mg/dL (ref 8.9–10.3)
Chloride: 92 mmol/L — ABNORMAL LOW (ref 101–111)
Creatinine, Ser: 2.01 mg/dL — ABNORMAL HIGH (ref 0.61–1.24)
GFR calc Af Amer: 41 mL/min — ABNORMAL LOW (ref >60)
GFR calc non Af Amer: 35 mL/min — ABNORMAL LOW (ref >60)
Glucose, Bld: 234 mg/dL — ABNORMAL HIGH (ref 65–99)
Potassium: 2.9 mmol/L — ABNORMAL LOW (ref 3.5–5.1)
Sodium: 129 mmol/L — ABNORMAL LOW (ref 135–145)

## 2017-07-26 LAB — CBC
HCT: 41.2 % (ref 39.0–52.0)
Hemoglobin: 14.4 g/dL (ref 13.0–17.0)
MCH: 33 pg (ref 26.0–34.0)
MCHC: 35 g/dL (ref 30.0–36.0)
MCV: 94.5 fL (ref 78.0–100.0)
Platelets: 209 10*3/uL (ref 150–400)
RBC: 4.36 MIL/uL (ref 4.22–5.81)
RDW: 12.3 % (ref 11.5–15.5)
WBC: 7.9 10*3/uL (ref 4.0–10.5)

## 2017-07-26 NOTE — ED Notes (Signed)
Dr. Billy Fischer notified of I-Stat Troponin = 0.08. Patient to return to triage for repeat EKG.

## 2017-07-26 NOTE — ED Triage Notes (Signed)
Pt brought to ED for increase SOB, cp and generalized weakness for the past few weeks, pt has appointment for late this week to follow up with Cardiology, but unable to go due to be homeless, pt having productive cough denies nausea or vomiting. BP 136/94, HR 98, R-20, SPO296% RA. 324 mg ASA given pta to ED.
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
## 2017-07-26 NOTE — ED Notes (Signed)
Notified nurse first results from istat troponin.

## 2017-07-27 LAB — I-STAT TROPONIN, ED: Troponin i, poc: 0.06 ng/mL (ref 0.00–0.08)

## 2017-07-27 IMAGING — CR DG TIBIA/FIBULA 2V*R*
4 series · 4 of 4 positions shown · non-contrast
Comparison: Ankle radiographs from the same day.

CLINICAL DATA: Tripped and fell today. Right ankle swelling and
limited range of motion.

EXAM:
RIGHT TIBIA AND FIBULA - 2 VIEW

[tibia ap (1 of 2)]
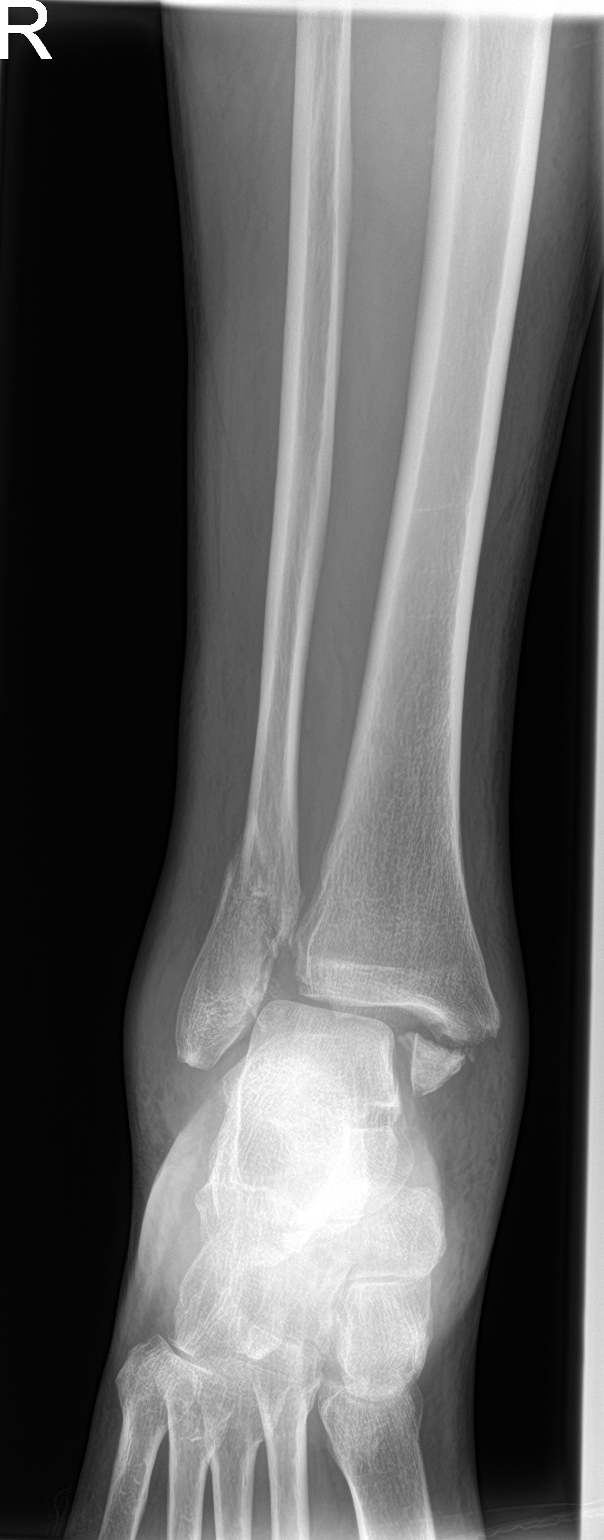

[tibia ap (2 of 2)]
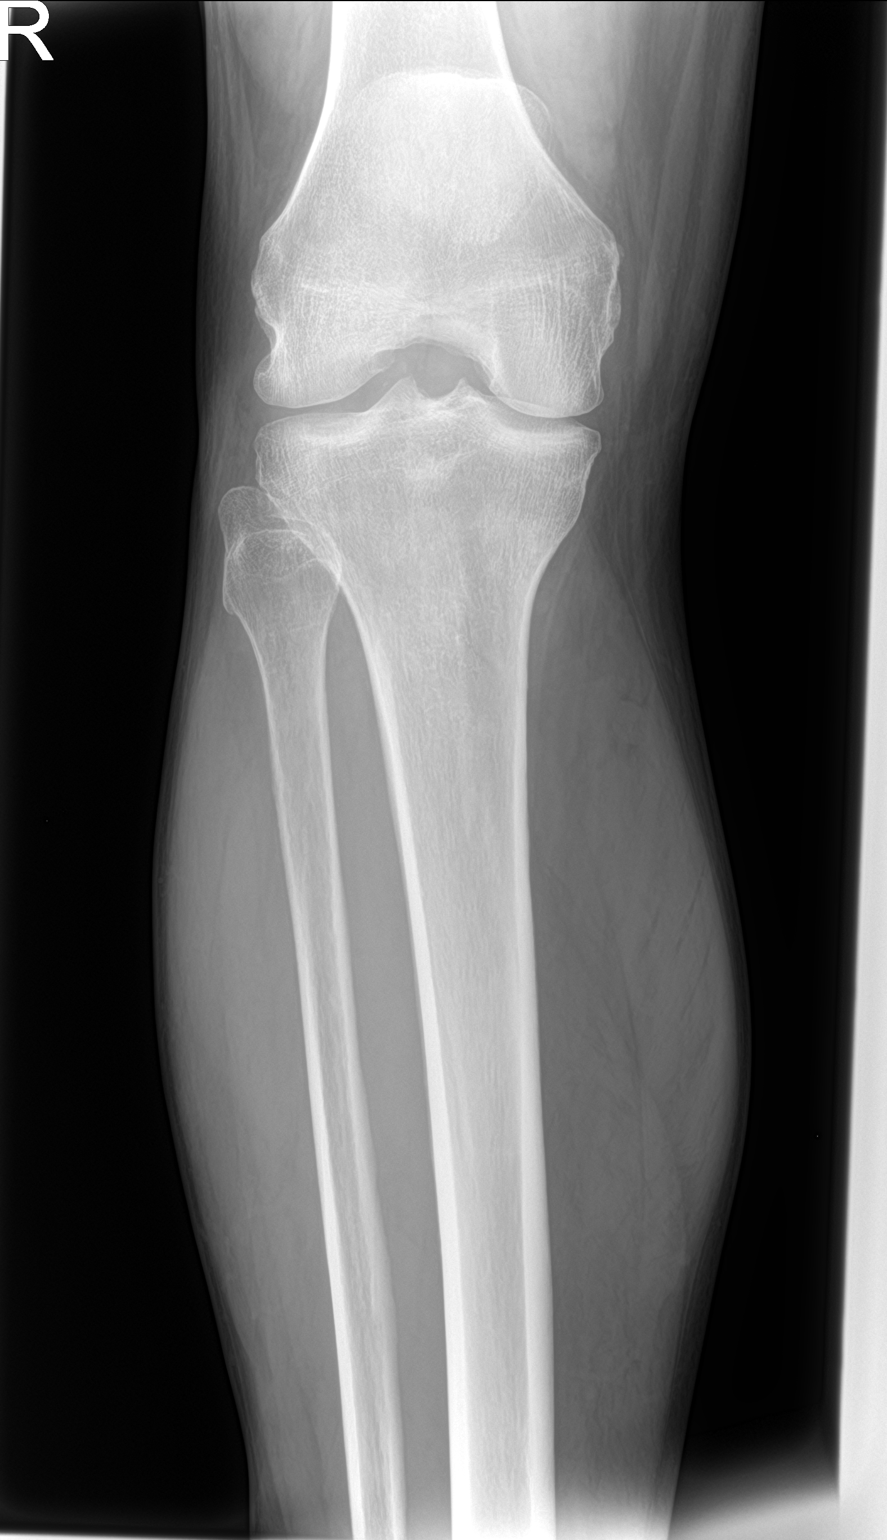

[tibia lat (1 of 2)]
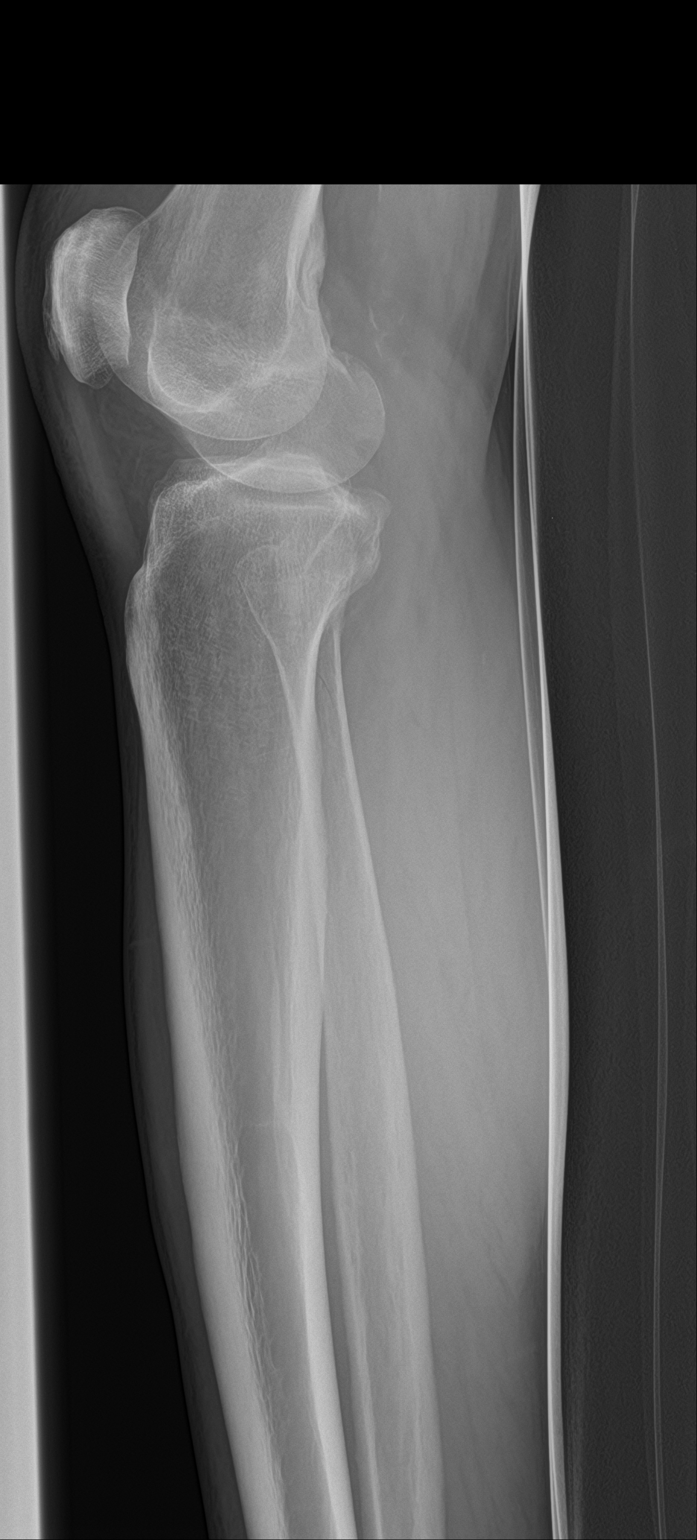

[tibia lat (2 of 2)]
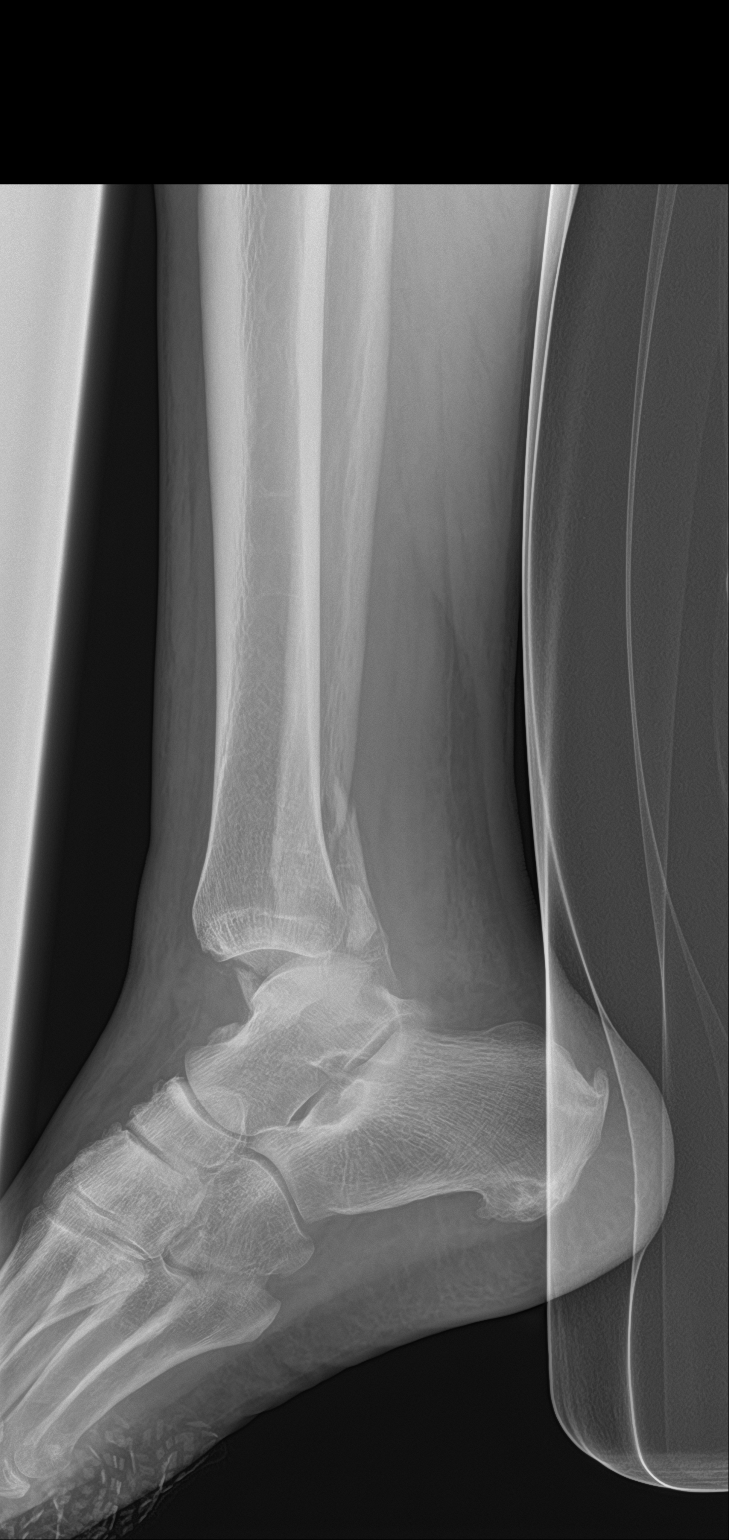

[4 of 4 positions shown; findings below may reference images not displayed]

FINDINGS: Trimalleolar ***---*** the comminuted trimalleolar
fracture-dislocation is again noted at the ankle. There is posterior
subluxation of the talus.

More proximal tibia and fibula are intact. The knee is remarkable
for degenerative change. No acute abnormalities present at the knee.
IMPRESSION: 1. Comminuted trimalleolar fracture dislocation of the right ankle.
This is described in greater detail in the report of the ankle
radiographs.
2. No significant proximal injury.

## 2017-07-27 IMAGING — CR DG ANKLE 2V *R*
2 series · 2 of 2 positions shown · non-contrast
Comparison: 08/03/2016

CLINICAL DATA: Post reduction

EXAM:
RIGHT ANKLE - 2 VIEW

[ankle ap]
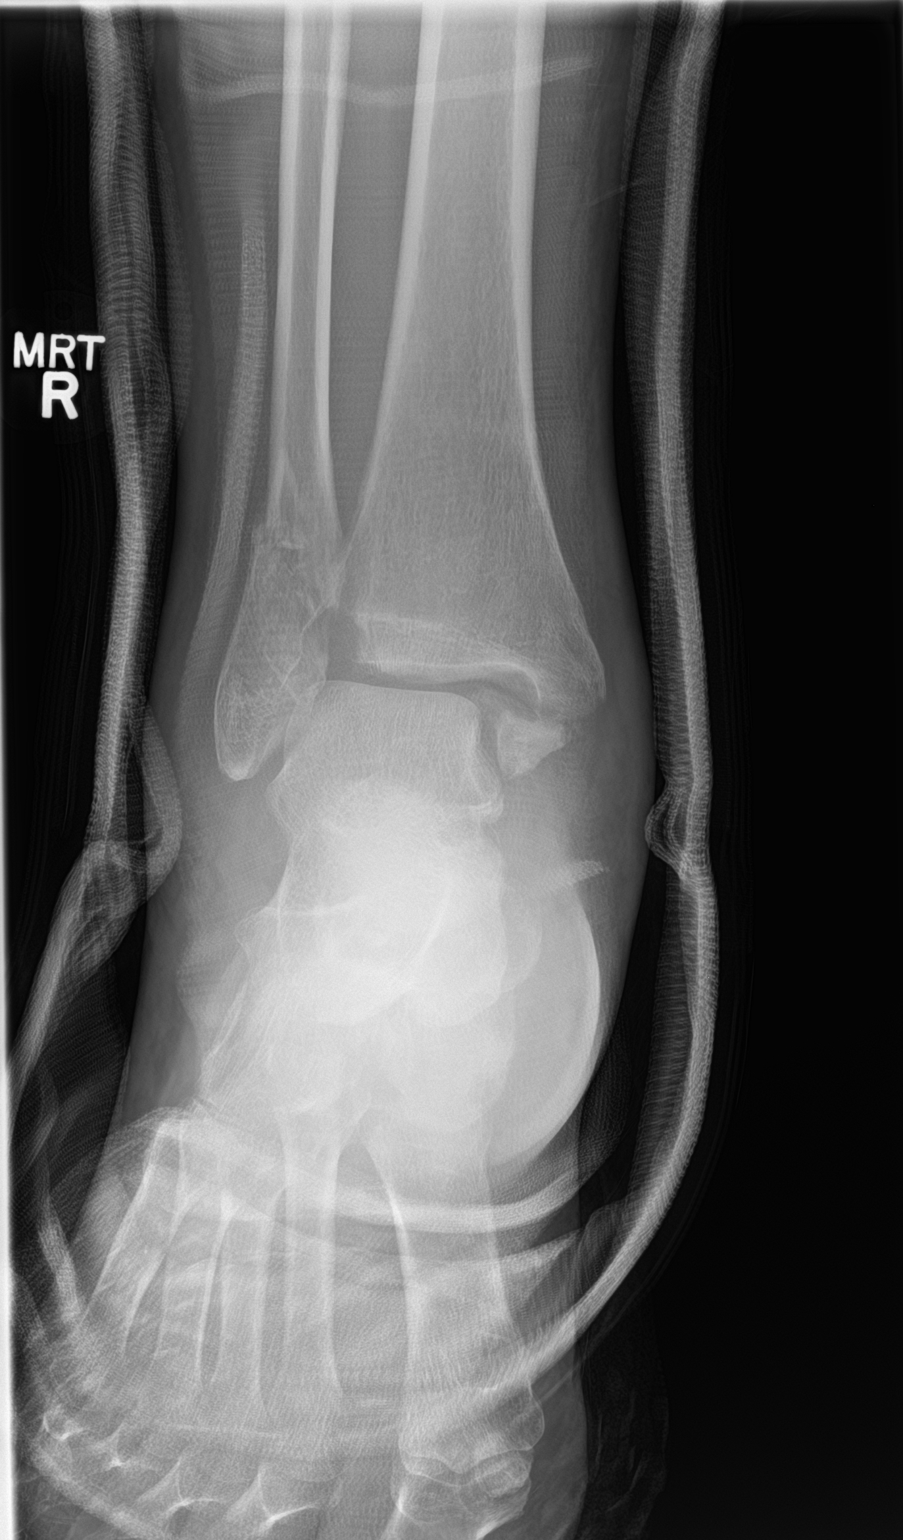

[ankle lat]
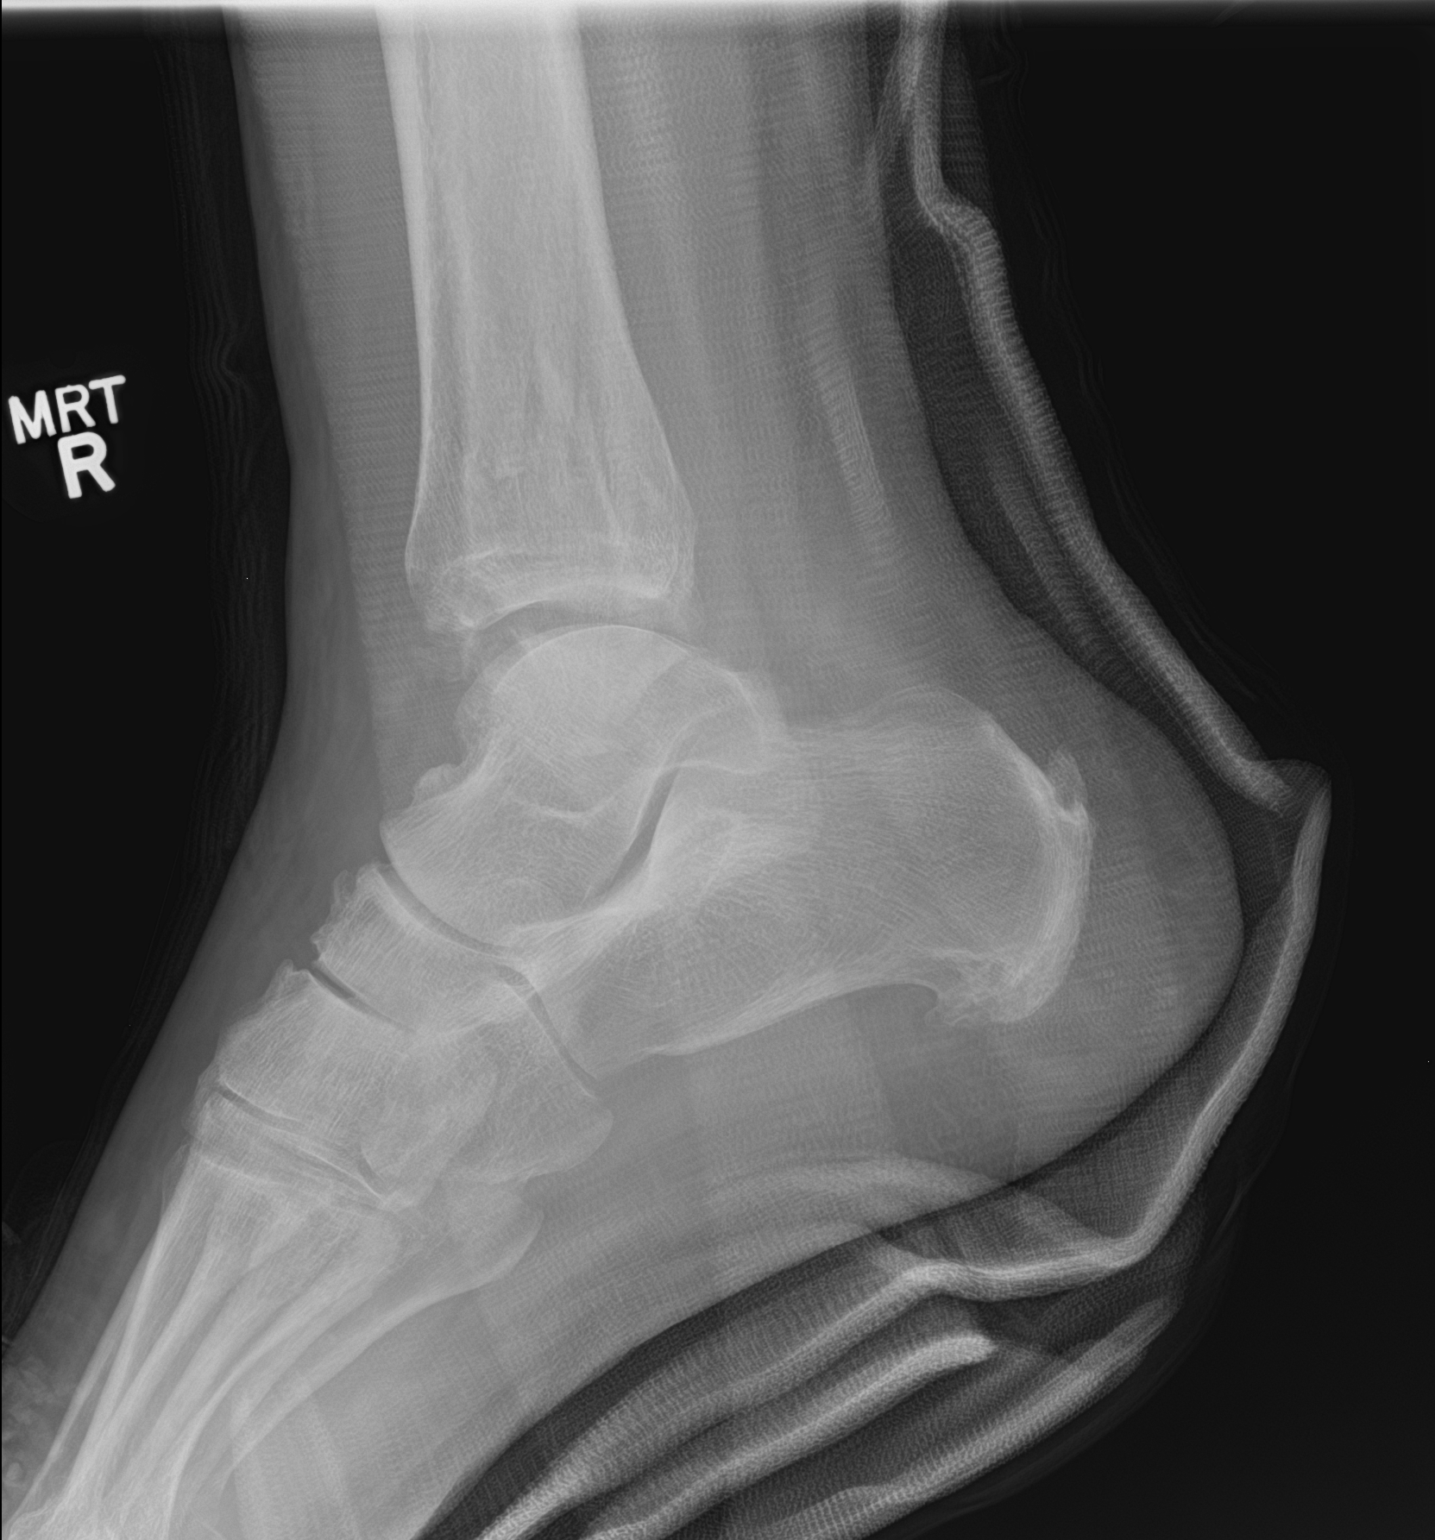

[2 of 2 positions shown; findings below may reference images not displayed]

FINDINGS: Fine detail obscured by cast material. There is bi malleolar
fracture again demonstrated. Posterior fracture not well
demonstrated. There is improved alignment of the ankle mortise.
There remains lateral subluxation of the talus in relation to the
tibia. The medial malleolus is displaced inward to the medial ankle
mortise
IMPRESSION: Minimal improvement in alignment following splinting. Trimalleolar
fracture.

## 2017-07-27 IMAGING — CR DG ANKLE COMPLETE 3+V*R*
3 series · 3 of 3 positions shown · non-contrast
Comparison: Tibia and fibula films from the same day.

CLINICAL DATA: Tripped and fell today. Right ankle swelling and
limited range of motion.

EXAM:
RIGHT ANKLE - COMPLETE 3+ VIEW

[ankle ap]
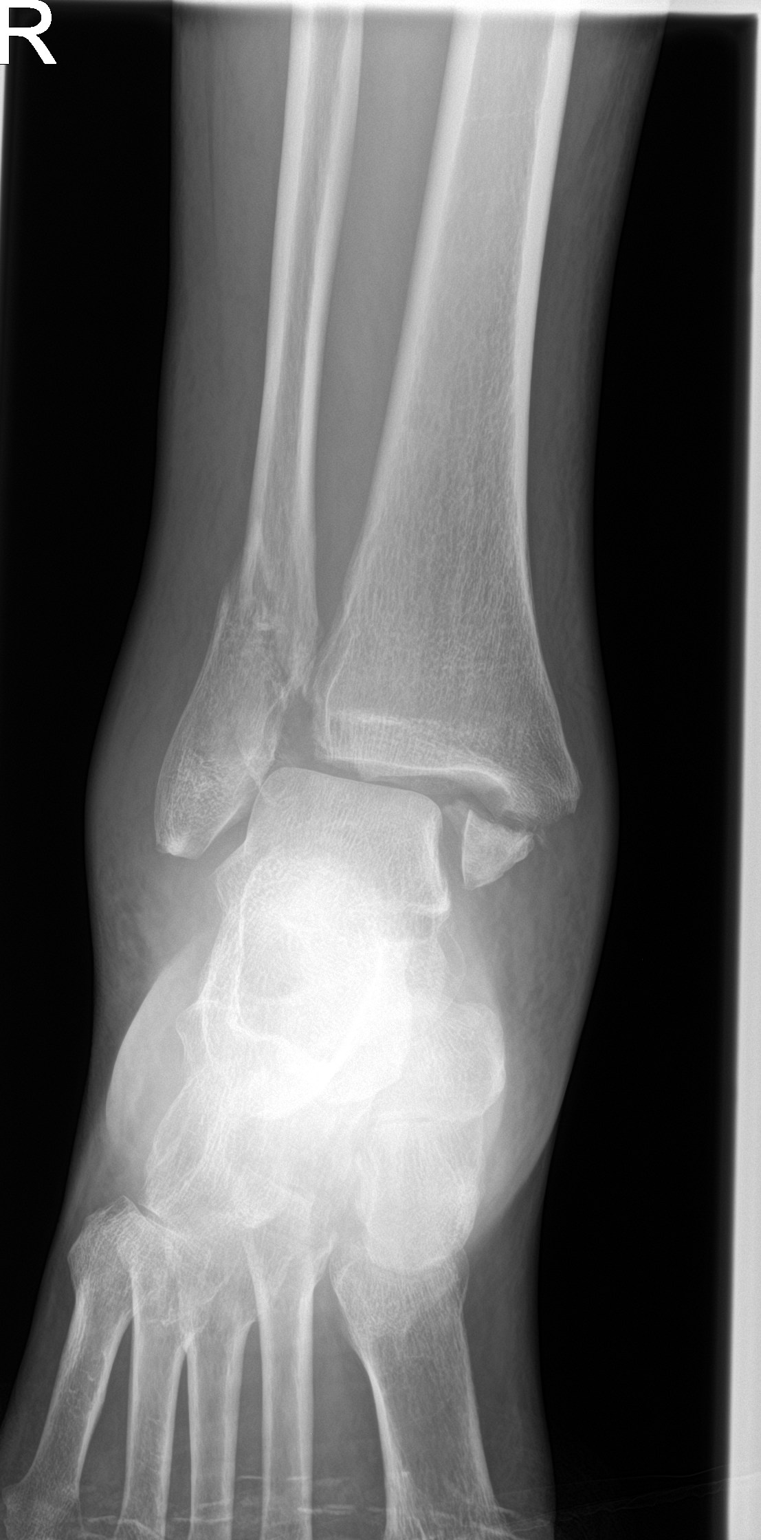

[ankle obl]
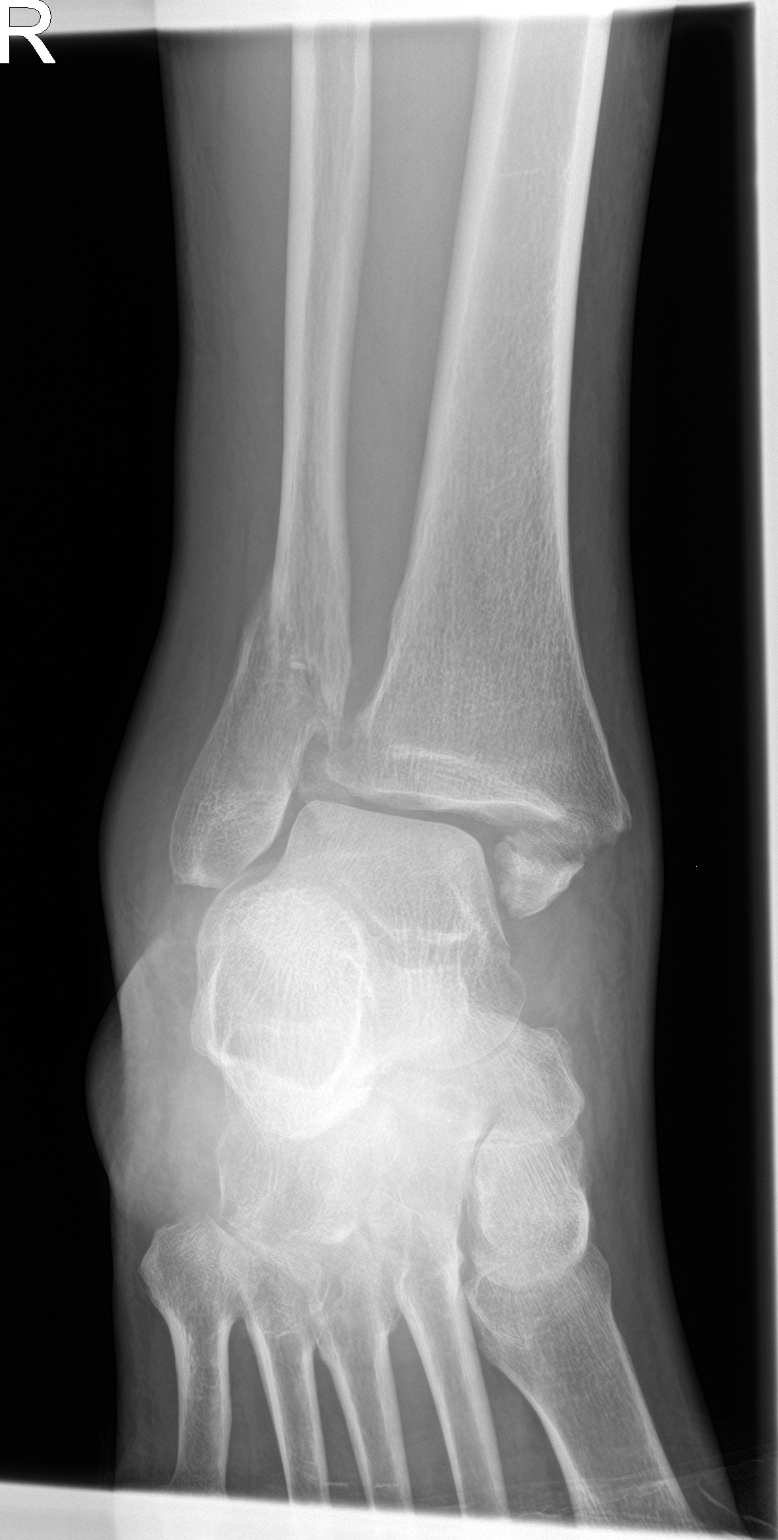

[ankle lat]
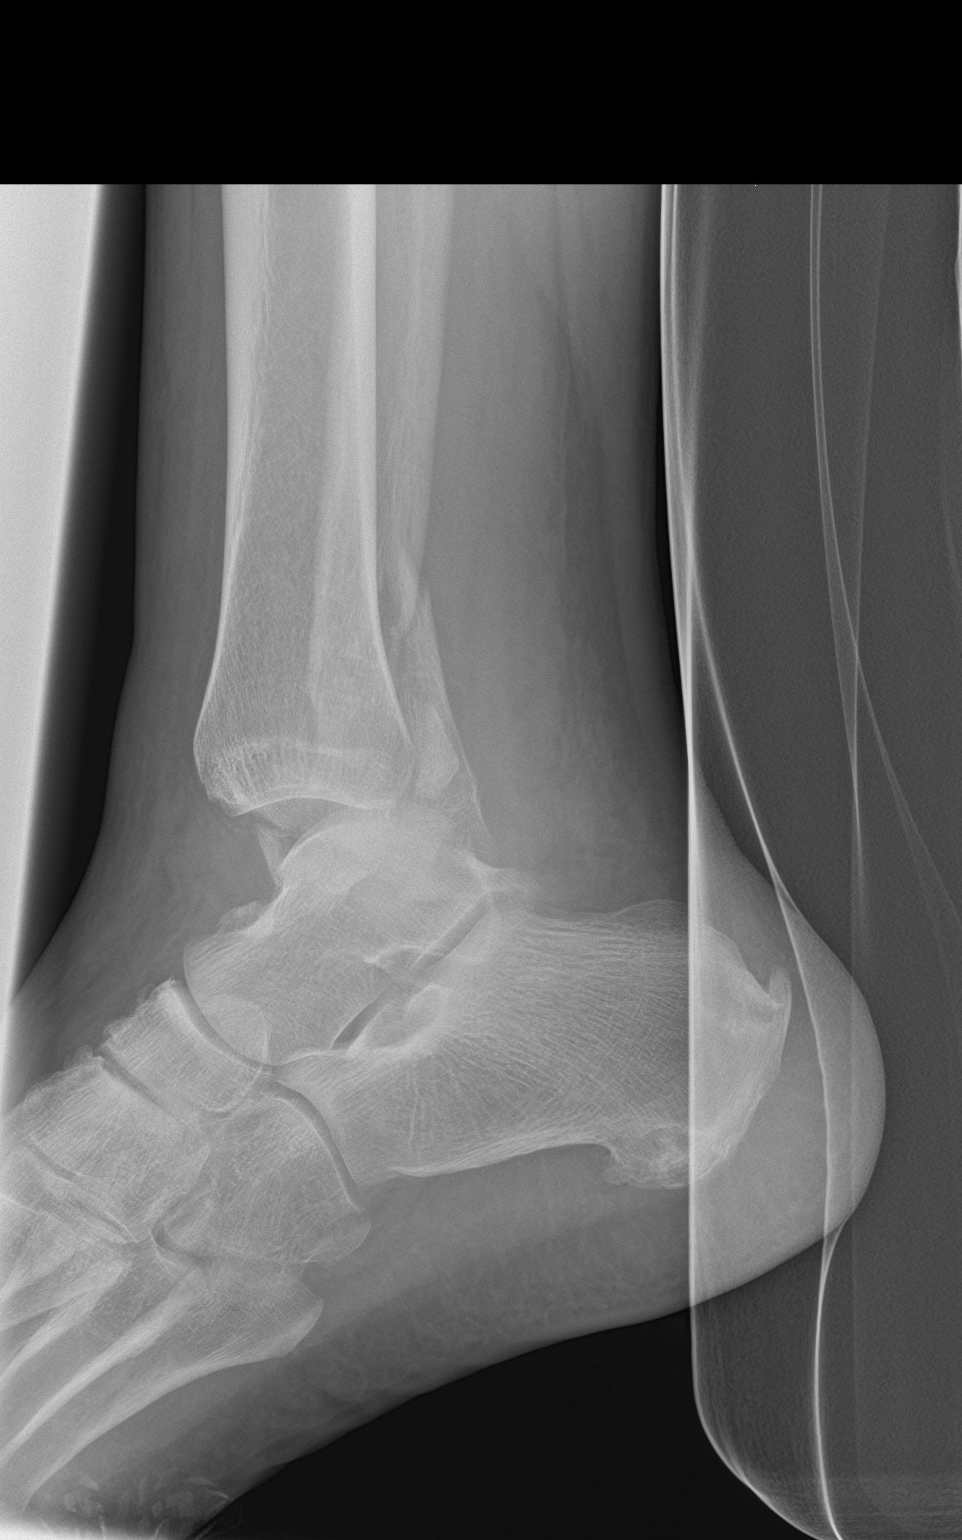

[3 of 3 positions shown; findings below may reference images not displayed]

FINDINGS: There is a fracture dislocation of the distal ankle. An avulsion
fractures present at the medial malleolus. There is an oblique
fracture through the distal fibula compatible with an eversion
injury. The tibiotalar distance is exaggerated. A posterior tibial
fracture is present as well. The talus is subluxed posteriorly.
Extensive soft tissue swelling is present.

Calcaneal spurs are noted. Degenerative changes are present in the
hindfoot.
IMPRESSION: 1. Comminuted trimalleolar fracture dislocation of the ankle as
described.

## 2017-07-27 IMAGING — CT CT ANKLE*R* W/O CM
3 of 5 series · 7 of 33 positions shown, 8 images · non-contrast
Comparison: Radiographs earlier this day

CLINICAL DATA: Right ankle fracture. Trip and fall with right ankle
pain.

EXAM:
CT OF THE RIGHT ANKLE WITHOUT CONTRAST
TECHNIQUE: Multidetector CT imaging of the right ankle was performed according
to the standard protocol. Multiplanar CT image reconstructions were
also generated.

[Series 202: soft tissue · axial · 0.22mm/px · z∈[+79,+79]mm · 1 of 96 slices shown, 2 images]
[im 52/96  soft-tissue]
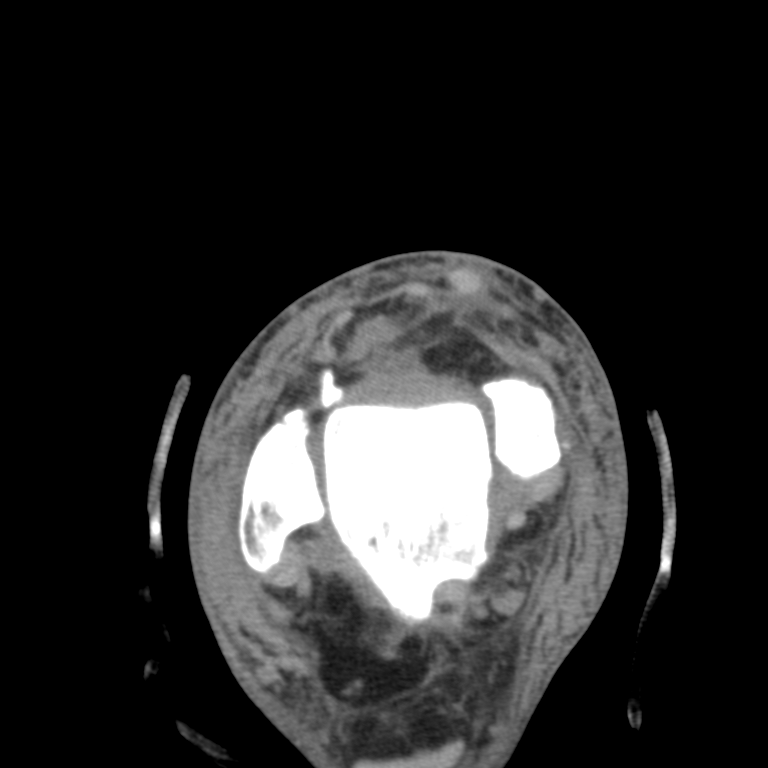
[im 52/96  bone]
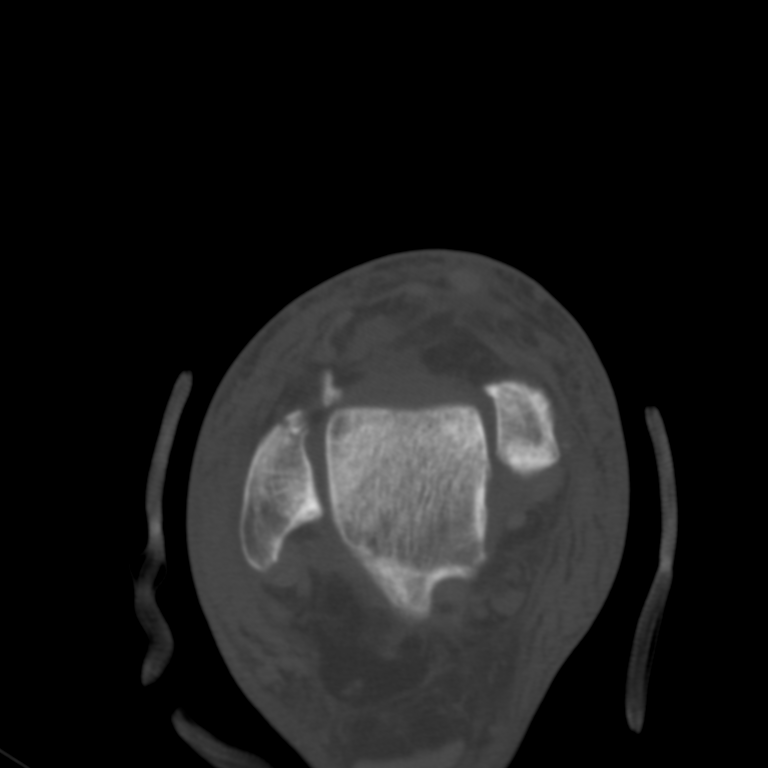

[Series 203: coronal · coronal · 0.23mm/px · 1 of 74 slices shown]
[im 37/74  bone]
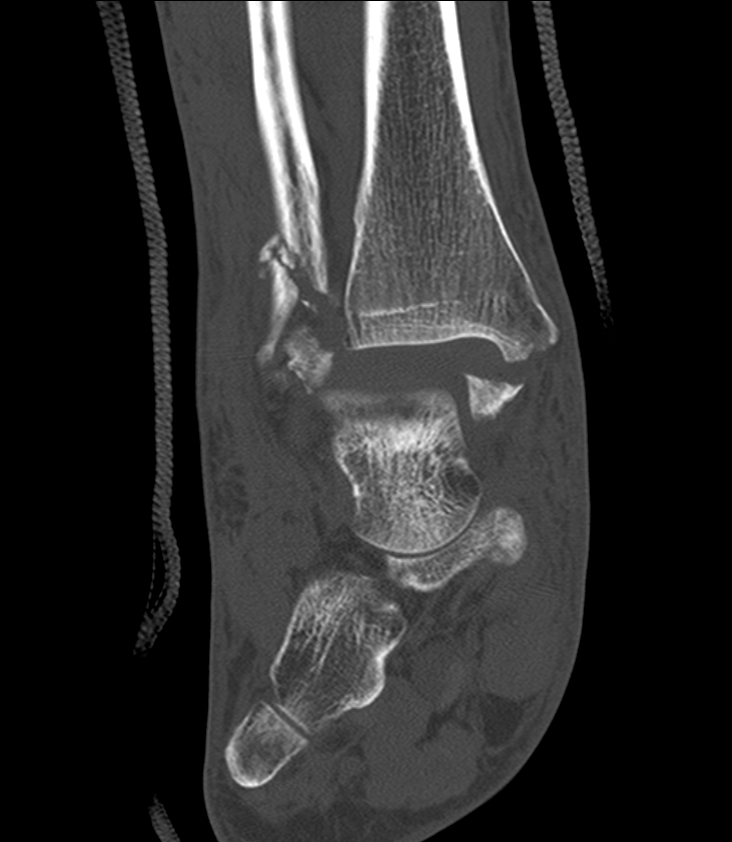

[Series 204: sagittal · sagittal · 0.22mm/px · 5 of 61 slices shown]
[im 11/61  bone]
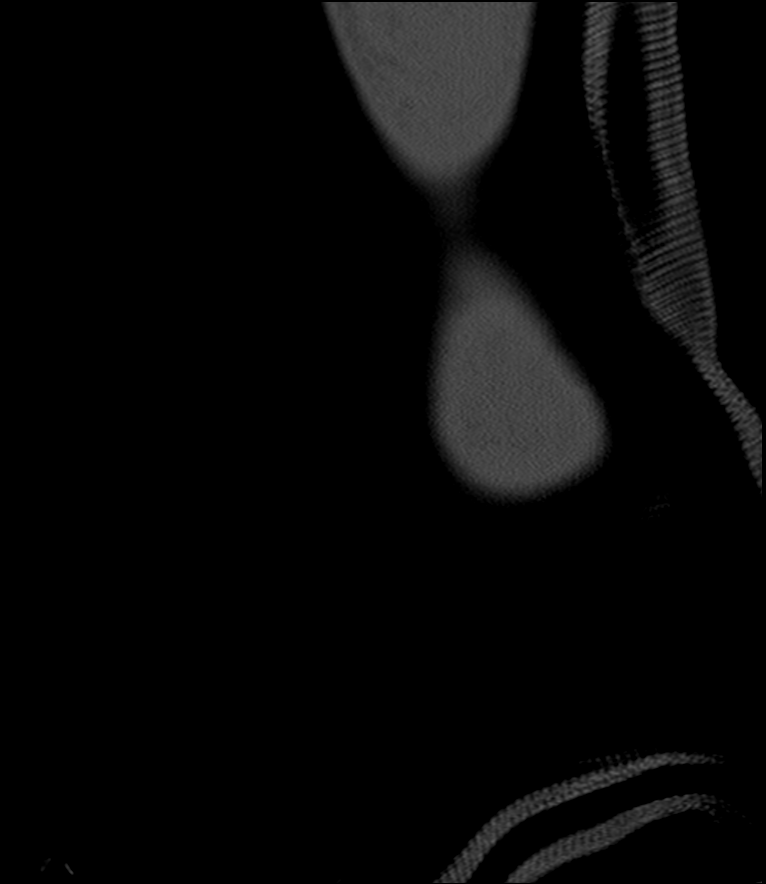
[im 21/61  bone]
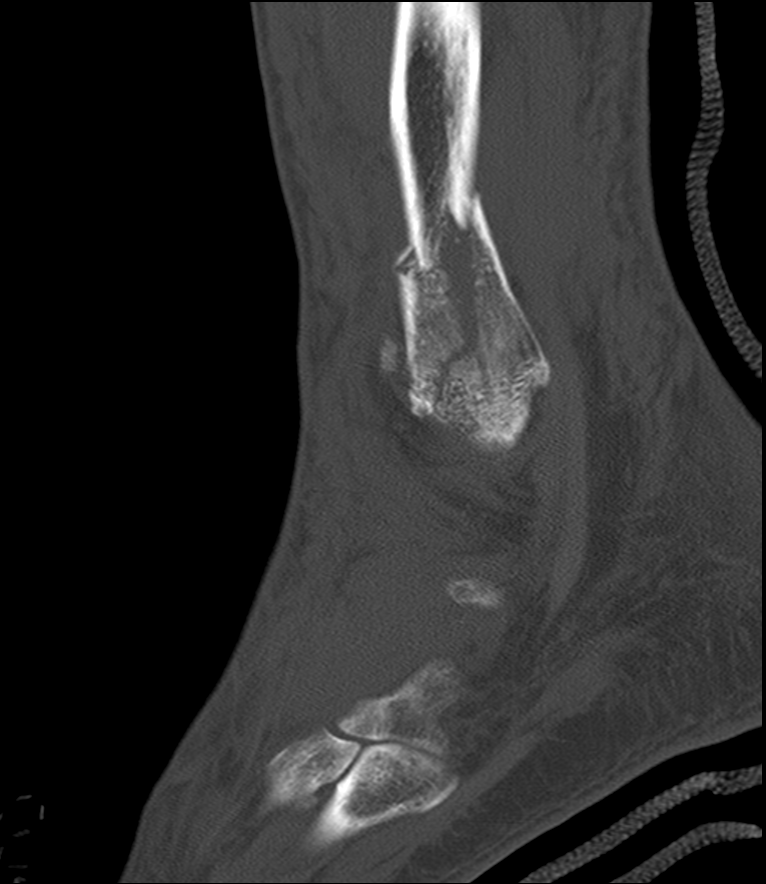
[im 31/61  bone]
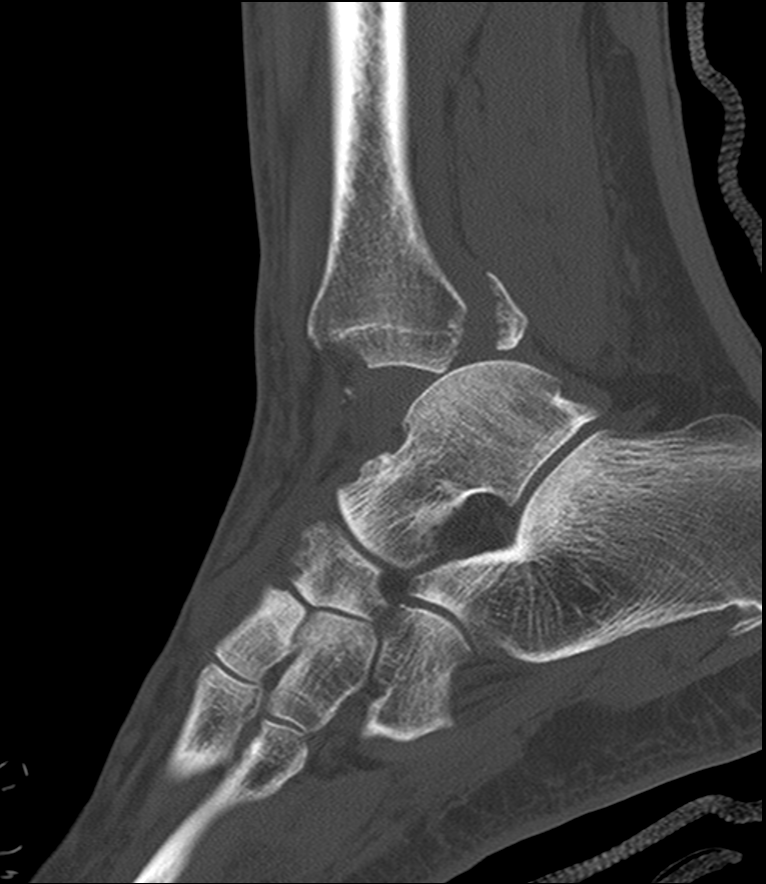
[im 41/61  bone]
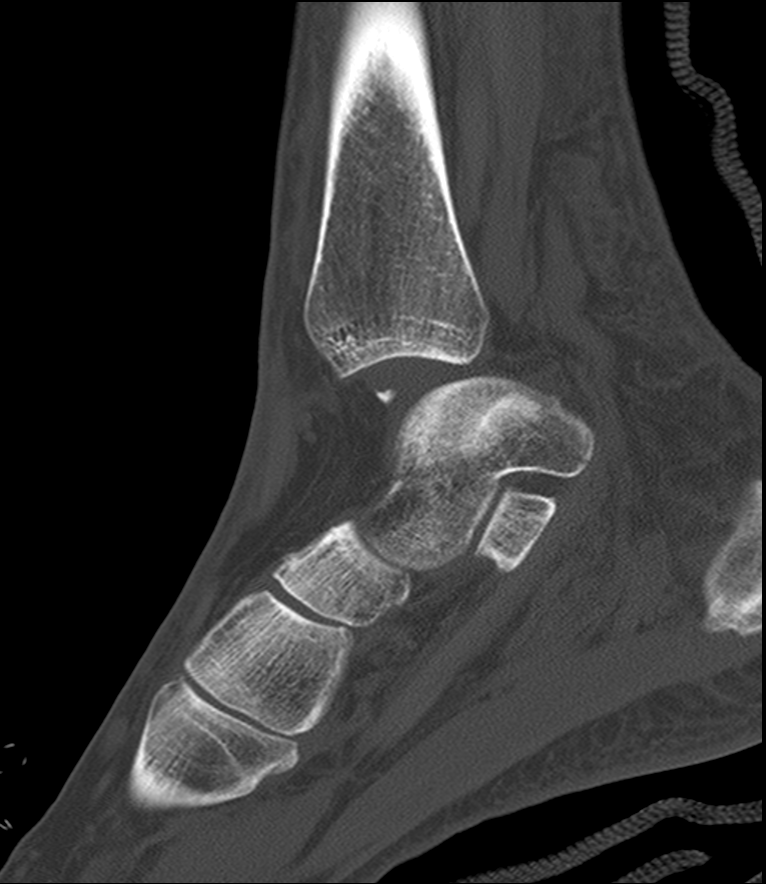
[im 51/61  bone]
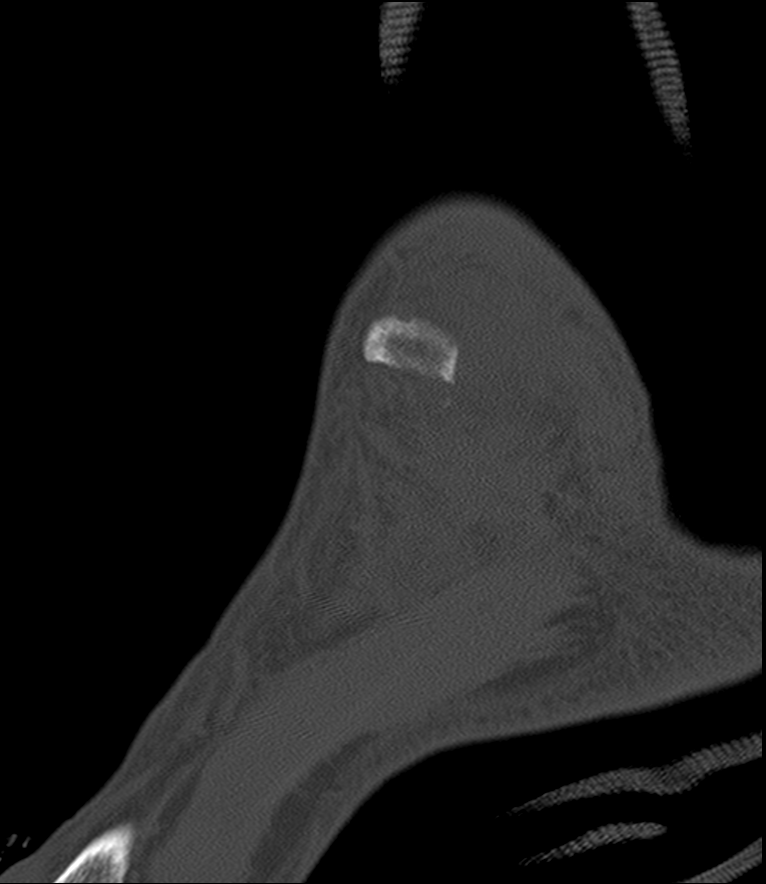

[7 of 33 positions shown; findings below may reference images not displayed]

FINDINGS: Displaced trimalleolar fracture. Transverse fracture through the
medial malleolus is minimally comminuted. Distal component remains
aligned with the talus. There is a 6 mm lateral subluxation of the
main tibial shaft with respect to the distal fragment.

Small posterior tibial fracture component is displaced 8 mm
posteriorly. The tibial plafond disease subluxed anteriorly with
respect to the talar dome.

Comminuted primarily oblique displaced distal fibular fracture at
and just proximal to the ankle mortise. Multiple small fracture
fragments at the fracture plane.

Additionally there is a displaced fracture of the anterior lateral
tibia with displacement of a 13 mm fragment laterally. This may be
an avulsion injury from the anterior syndesmotic attachment.

There is no evidence of flexor tendon entrapment. Mild thickening of
perennial brevis tendon at the fibular fracture site, with minimal
lateral subluxation in the fibular groove. Extensor tendons are
intact. There is diffuse soft tissue edema.
IMPRESSION: 1. Displaced trimalleolar fracture with anterior subluxation of the
tibial plafond with respect to the talar dome.
2. Additionally there is a displaced fracture from the anterior
lateral tibia.

## 2017-07-27 MED ORDER — POTASSIUM CHLORIDE CRYS ER 20 MEQ PO TBCR
40.0000 meq | EXTENDED_RELEASE_TABLET | Freq: Once | ORAL | Status: AC
  Administered 2017-07-27: 40 meq via ORAL
  Filled 2017-07-27: qty 2

## 2017-07-27 MED ORDER — GI COCKTAIL ~~LOC~~
30.0000 mL | Freq: Once | ORAL | Status: AC
  Administered 2017-07-27: 30 mL via ORAL
  Filled 2017-07-27: qty 30

## 2017-07-27 MED ORDER — FAMOTIDINE 20 MG PO TABS
40.0000 mg | ORAL_TABLET | Freq: Once | ORAL | Status: AC
  Administered 2017-07-27: 40 mg via ORAL
  Filled 2017-07-27: qty 2

## 2017-07-27 NOTE — ED Notes (Signed)
Reviewed d/c instructions with pt, who had no questions. Given bus pass upon departure, per request. Unable to obtain signature d/t malfunction of signature pad on computer in room. Pt departed in NAD.

## 2017-07-28 ENCOUNTER — Ambulatory Visit: Payer: Medicaid Other | Admitting: Physician Assistant
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
## 2017-08-02 IMAGING — RF DG C-ARM 61-120 MIN
1 series · 4 of 4 positions shown · non-contrast
Comparison: CT from 6 days ago

CLINICAL DATA: Elective surgery.

EXAM:
DG C-ARM 61-120 MIN; RIGHT ANKLE - COMPLETE 3+ VIEW

[Series 1: run · 4 of 4 slices shown]
[im 1/4]
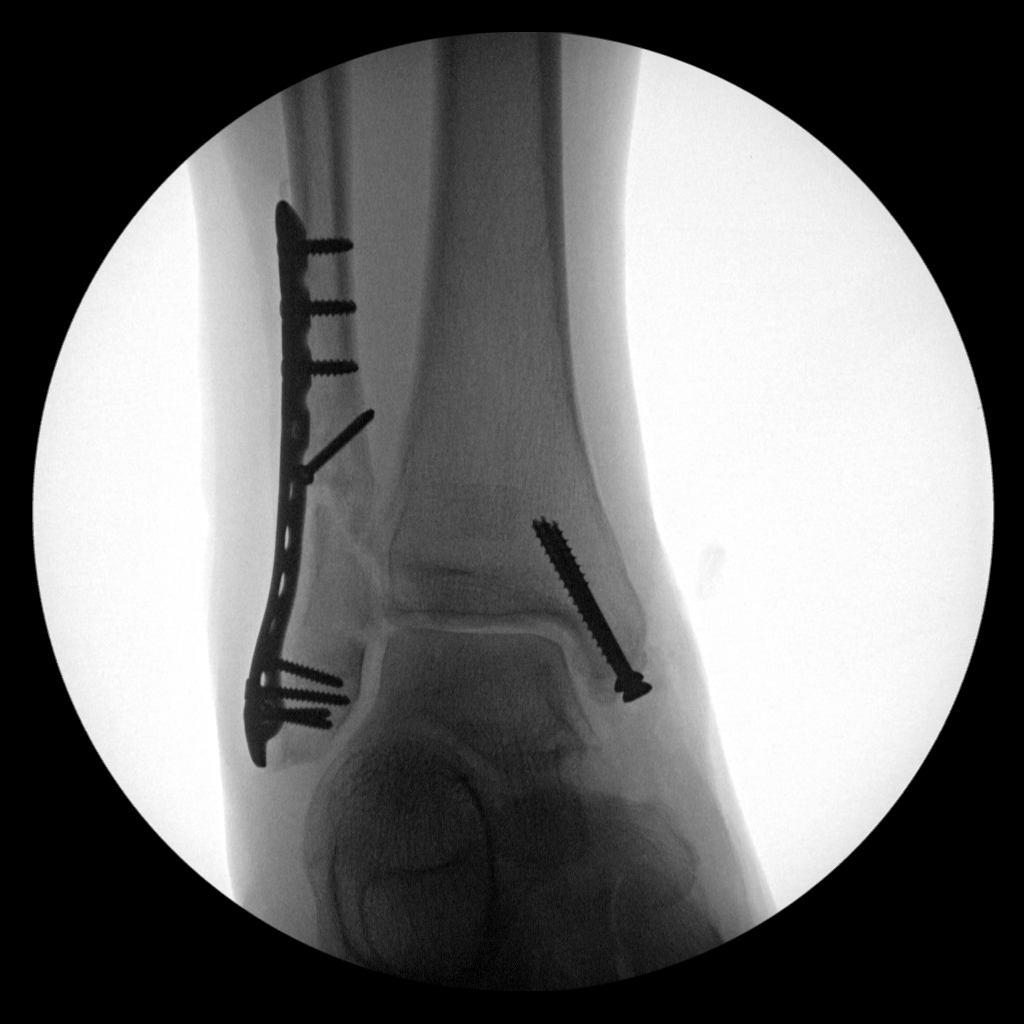
[im 2/4]
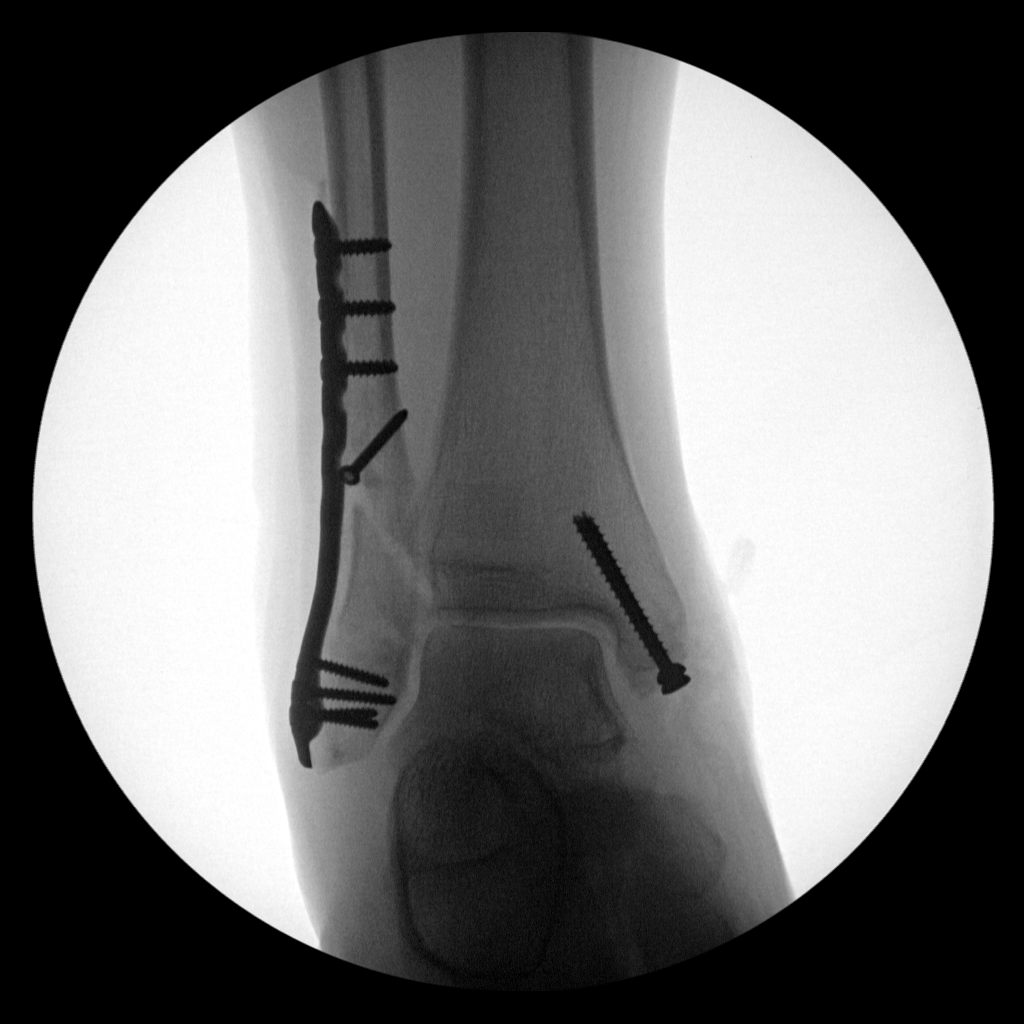
[im 3/4]
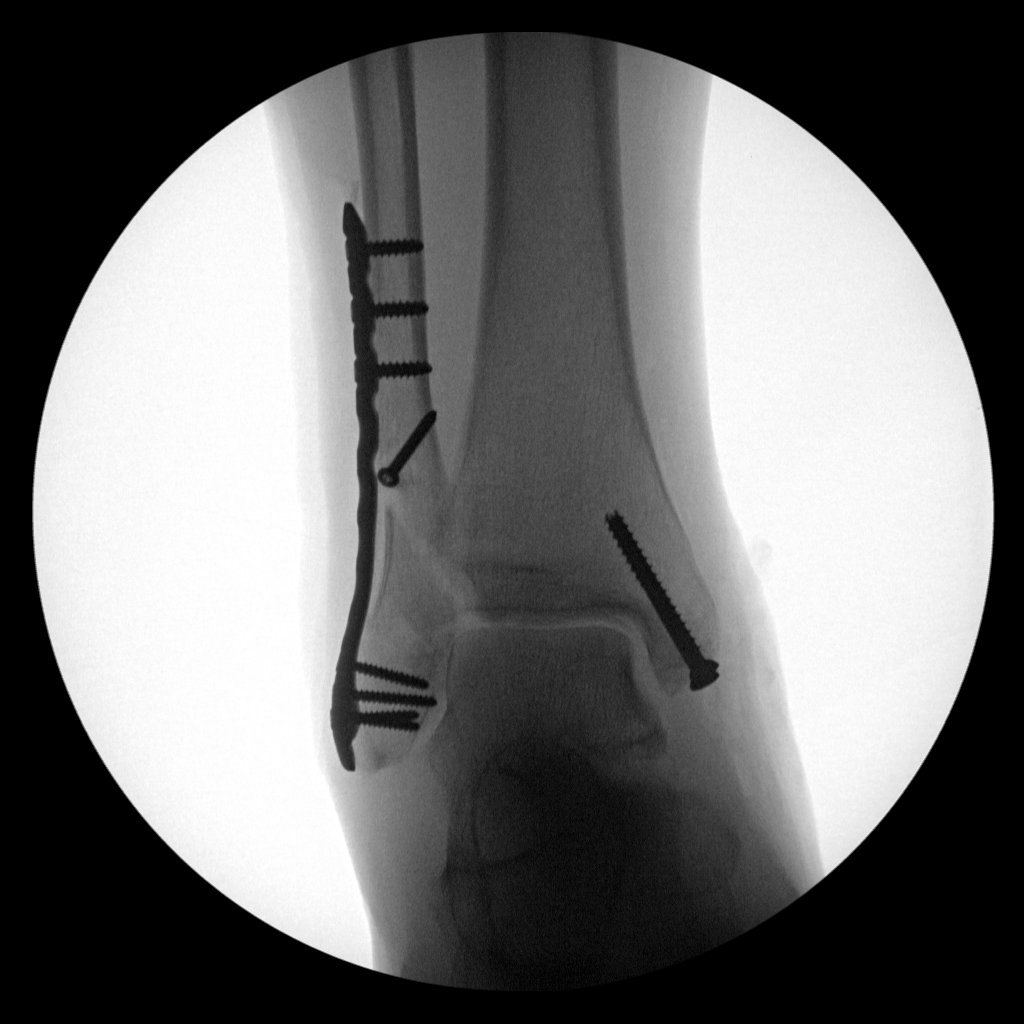
[im 4/4]
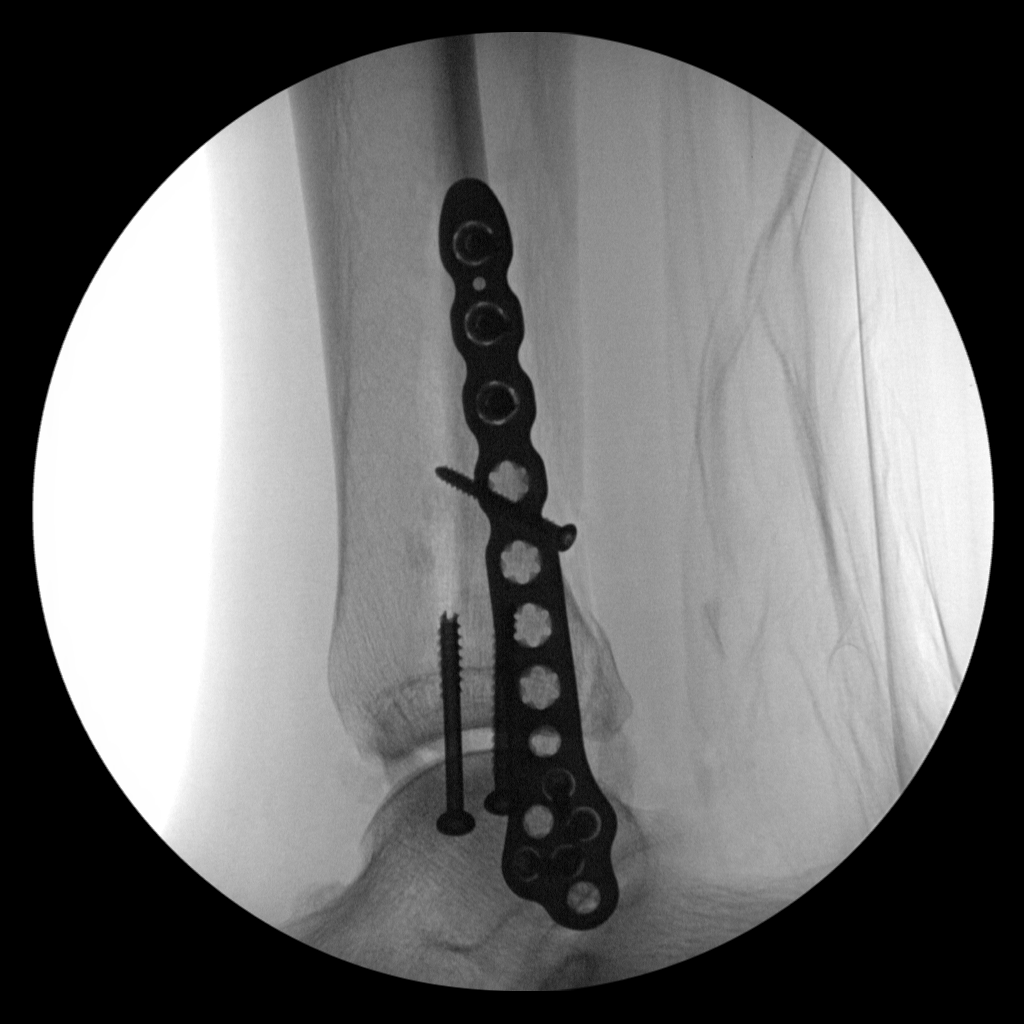

[4 of 4 positions shown; findings below may reference images not displayed]

FINDINGS: Relocated ankle joint with medial malleolus and distal fibula
fracture repair. Physiologic alignment of posterior malleolus
fracture. Small nondisplaced posterior talus fracture on previous CT
is not visualized.
IMPRESSION: 1. Fluoroscopy for ankle fracture repair.  No unexpected finding.
2. Nondisplaced posterior talus fracture on previous CT is not
visible.

## 2017-08-05 ENCOUNTER — Emergency Department (HOSPITAL_COMMUNITY): Payer: Medicaid Other

## 2017-08-05 ENCOUNTER — Inpatient Hospital Stay (HOSPITAL_COMMUNITY)
Admission: EM | Admit: 2017-08-05 | Discharge: 2017-08-07 | DRG: 206 | Disposition: A | Discharge status: Home / Self Care | Payer: Medicaid Other | Attending: Student in an Organized Health Care Education/Training Program | Admitting: Student in an Organized Health Care Education/Training Program

## 2017-08-05 ENCOUNTER — Telehealth: Payer: Self-pay

## 2017-08-05 ENCOUNTER — Encounter (HOSPITAL_COMMUNITY): Payer: Self-pay | Admitting: Emergency Medicine

## 2017-08-05 ENCOUNTER — Other Ambulatory Visit: Payer: Self-pay

## 2017-08-05 DIAGNOSIS — I480 Paroxysmal atrial fibrillation: Secondary | ICD-10-CM | POA: Diagnosis not present

## 2017-08-05 DIAGNOSIS — Z888 Allergy status to other drugs, medicaments and biological substances status: Secondary | ICD-10-CM

## 2017-08-05 DIAGNOSIS — E785 Hyperlipidemia, unspecified: Secondary | ICD-10-CM

## 2017-08-05 DIAGNOSIS — E1140 Type 2 diabetes mellitus with diabetic neuropathy, unspecified: Secondary | ICD-10-CM | POA: Diagnosis present

## 2017-08-05 DIAGNOSIS — E1122 Type 2 diabetes mellitus with diabetic chronic kidney disease: Secondary | ICD-10-CM | POA: Diagnosis present

## 2017-08-05 DIAGNOSIS — R071 Chest pain on breathing: Secondary | ICD-10-CM | POA: Diagnosis present

## 2017-08-05 DIAGNOSIS — Z8781 Personal history of (healed) traumatic fracture: Secondary | ICD-10-CM

## 2017-08-05 DIAGNOSIS — N183 Chronic kidney disease, stage 3 (moderate): Secondary | ICD-10-CM | POA: Diagnosis present

## 2017-08-05 DIAGNOSIS — I4892 Unspecified atrial flutter: Secondary | ICD-10-CM | POA: Diagnosis present

## 2017-08-05 DIAGNOSIS — K219 Gastro-esophageal reflux disease without esophagitis: Secondary | ICD-10-CM | POA: Diagnosis present

## 2017-08-05 DIAGNOSIS — B182 Chronic viral hepatitis C: Secondary | ICD-10-CM

## 2017-08-05 DIAGNOSIS — F329 Major depressive disorder, single episode, unspecified: Secondary | ICD-10-CM | POA: Diagnosis present

## 2017-08-05 DIAGNOSIS — M1990 Unspecified osteoarthritis, unspecified site: Secondary | ICD-10-CM | POA: Diagnosis present

## 2017-08-05 DIAGNOSIS — I130 Hypertensive heart and chronic kidney disease with heart failure and stage 1 through stage 4 chronic kidney disease, or unspecified chronic kidney disease: Secondary | ICD-10-CM | POA: Diagnosis not present

## 2017-08-05 DIAGNOSIS — F1010 Alcohol abuse, uncomplicated: Secondary | ICD-10-CM | POA: Diagnosis present

## 2017-08-05 DIAGNOSIS — F17210 Nicotine dependence, cigarettes, uncomplicated: Secondary | ICD-10-CM | POA: Diagnosis not present

## 2017-08-05 DIAGNOSIS — B1920 Unspecified viral hepatitis C without hepatic coma: Secondary | ICD-10-CM | POA: Diagnosis not present

## 2017-08-05 DIAGNOSIS — K7460 Unspecified cirrhosis of liver: Secondary | ICD-10-CM | POA: Diagnosis present

## 2017-08-05 DIAGNOSIS — M940 Chondrocostal junction syndrome [Tietze]: Principal | ICD-10-CM | POA: Diagnosis present

## 2017-08-05 DIAGNOSIS — I5032 Chronic diastolic (congestive) heart failure: Secondary | ICD-10-CM | POA: Diagnosis not present

## 2017-08-05 DIAGNOSIS — Z7901 Long term (current) use of anticoagulants: Secondary | ICD-10-CM

## 2017-08-05 DIAGNOSIS — Z87898 Personal history of other specified conditions: Secondary | ICD-10-CM

## 2017-08-05 DIAGNOSIS — R079 Chest pain, unspecified: Secondary | ICD-10-CM | POA: Diagnosis not present

## 2017-08-05 DIAGNOSIS — Z590 Homelessness: Secondary | ICD-10-CM

## 2017-08-05 DIAGNOSIS — Z609 Problem related to social environment, unspecified: Secondary | ICD-10-CM | POA: Diagnosis present

## 2017-08-05 DIAGNOSIS — I447 Left bundle-branch block, unspecified: Secondary | ICD-10-CM | POA: Diagnosis present

## 2017-08-05 DIAGNOSIS — I5042 Chronic combined systolic (congestive) and diastolic (congestive) heart failure: Secondary | ICD-10-CM | POA: Diagnosis present

## 2017-08-05 DIAGNOSIS — I2510 Atherosclerotic heart disease of native coronary artery without angina pectoris: Secondary | ICD-10-CM | POA: Diagnosis present

## 2017-08-05 DIAGNOSIS — R7989 Other specified abnormal findings of blood chemistry: Secondary | ICD-10-CM | POA: Diagnosis present

## 2017-08-05 DIAGNOSIS — Z9889 Other specified postprocedural states: Secondary | ICD-10-CM

## 2017-08-05 DIAGNOSIS — G629 Polyneuropathy, unspecified: Secondary | ICD-10-CM | POA: Diagnosis present

## 2017-08-05 DIAGNOSIS — Z794 Long term (current) use of insulin: Secondary | ICD-10-CM

## 2017-08-05 DIAGNOSIS — Z79899 Other long term (current) drug therapy: Secondary | ICD-10-CM

## 2017-08-05 HISTORY — DX: Pure hypercholesterolemia, unspecified: E78.00

## 2017-08-05 HISTORY — DX: Unspecified atrial fibrillation: I48.91

## 2017-08-05 LAB — HEPATIC FUNCTION PANEL
ALT: 34 U/L (ref 17–63)
AST: 47 U/L — ABNORMAL HIGH (ref 15–41)
Albumin: 3.6 g/dL (ref 3.5–5.0)
Alkaline Phosphatase: 77 U/L (ref 38–126)
Bilirubin, Direct: 0.2 mg/dL (ref 0.1–0.5)
Indirect Bilirubin: 0.7 mg/dL (ref 0.3–0.9)
Total Bilirubin: 0.9 mg/dL (ref 0.3–1.2)
Total Protein: 8.9 g/dL — ABNORMAL HIGH (ref 6.5–8.1)

## 2017-08-05 LAB — I-STAT TROPONIN, ED: Troponin i, poc: 0.09 ng/mL (ref 0.00–0.08)

## 2017-08-05 LAB — BRAIN NATRIURETIC PEPTIDE: B Natriuretic Peptide: 263.7 pg/mL — ABNORMAL HIGH (ref 0.0–100.0)

## 2017-08-05 LAB — BASIC METABOLIC PANEL
Anion gap: 12 (ref 5–15)
BUN: 18 mg/dL (ref 6–20)
CO2: 23 mmol/L (ref 22–32)
Calcium: 8.9 mg/dL (ref 8.9–10.3)
Chloride: 103 mmol/L (ref 101–111)
Creatinine, Ser: 1.68 mg/dL — ABNORMAL HIGH (ref 0.61–1.24)
GFR calc Af Amer: 51 mL/min — ABNORMAL LOW (ref >60)
GFR calc non Af Amer: 44 mL/min — ABNORMAL LOW (ref >60)
Glucose, Bld: 178 mg/dL — ABNORMAL HIGH (ref 65–99)
Potassium: 3.7 mmol/L (ref 3.5–5.1)
Sodium: 138 mmol/L (ref 135–145)

## 2017-08-05 LAB — CBC
HCT: 39.7 % (ref 39.0–52.0)
Hemoglobin: 14.1 g/dL (ref 13.0–17.0)
MCH: 33.3 pg (ref 26.0–34.0)
MCHC: 35.5 g/dL (ref 30.0–36.0)
MCV: 93.6 fL (ref 78.0–100.0)
Platelets: 166 10*3/uL (ref 150–400)
RBC: 4.24 MIL/uL (ref 4.22–5.81)
RDW: 12.3 % (ref 11.5–15.5)
WBC: 6.3 10*3/uL (ref 4.0–10.5)

## 2017-08-05 LAB — RAPID URINE DRUG SCREEN, HOSP PERFORMED
Amphetamines: NOT DETECTED
Barbiturates: NOT DETECTED
Benzodiazepines: NOT DETECTED
Cocaine: NOT DETECTED
Opiates: NOT DETECTED
Tetrahydrocannabinol: NOT DETECTED

## 2017-08-05 LAB — GLUCOSE, CAPILLARY: Glucose-Capillary: 219 mg/dL — ABNORMAL HIGH (ref 65–99)

## 2017-08-05 LAB — TROPONIN I: Troponin I: 0.08 ng/mL (ref <0.03)

## 2017-08-05 MED ORDER — MORPHINE SULFATE (PF) 4 MG/ML IV SOLN
4.0000 mg | INTRAVENOUS | Status: DC | PRN
  Administered 2017-08-05 – 2017-08-06 (×3): 4 mg via INTRAVENOUS
  Filled 2017-08-05 (×3): qty 1

## 2017-08-05 MED ORDER — NICOTINE 14 MG/24HR TD PT24
14.0000 mg | MEDICATED_PATCH | Freq: Every day | TRANSDERMAL | Status: DC
  Administered 2017-08-05 – 2017-08-07 (×3): 14 mg via TRANSDERMAL
  Filled 2017-08-05 (×3): qty 1

## 2017-08-05 MED ORDER — AMITRIPTYLINE HCL 50 MG PO TABS
75.0000 mg | ORAL_TABLET | Freq: Every day | ORAL | Status: DC
  Administered 2017-08-05 – 2017-08-06 (×2): 75 mg via ORAL
  Filled 2017-08-05 (×2): qty 2

## 2017-08-05 MED ORDER — INSULIN GLARGINE 100 UNIT/ML ~~LOC~~ SOLN
30.0000 [IU] | Freq: Every day | SUBCUTANEOUS | Status: DC
  Administered 2017-08-05 – 2017-08-06 (×2): 30 [IU] via SUBCUTANEOUS
  Filled 2017-08-05 (×4): qty 0.3

## 2017-08-05 MED ORDER — CARVEDILOL 12.5 MG PO TABS: 12.5000 mg | ORAL_TABLET | Freq: Two times a day (BID) | ORAL | Status: DC

## 2017-08-05 MED ORDER — TRAMADOL HCL 50 MG PO TABS
50.0000 mg | ORAL_TABLET | Freq: Four times a day (QID) | ORAL | Status: DC | PRN
  Administered 2017-08-05 – 2017-08-06 (×3): 50 mg via ORAL
  Filled 2017-08-05 (×3): qty 1

## 2017-08-05 MED ORDER — ACETAMINOPHEN 650 MG RE SUPP
650.0000 mg | Freq: Four times a day (QID) | RECTAL | Status: DC | PRN
Start: 2017-08-05 — End: 2017-08-07

## 2017-08-05 MED ORDER — MORPHINE SULFATE (PF) 4 MG/ML IV SOLN
6.0000 mg | Freq: Once | INTRAVENOUS | Status: AC
  Administered 2017-08-05: 6 mg via INTRAVENOUS
  Filled 2017-08-05: qty 2

## 2017-08-05 MED ORDER — IOPAMIDOL (ISOVUE-370) INJECTION 76%
INTRAVENOUS | Status: AC
  Administered 2017-08-05: 80 mL
  Filled 2017-08-05: qty 100

## 2017-08-05 MED ORDER — CARVEDILOL 12.5 MG PO TABS
12.5000 mg | ORAL_TABLET | Freq: Two times a day (BID) | ORAL | Status: DC
  Administered 2017-08-05 – 2017-08-07 (×4): 12.5 mg via ORAL
  Filled 2017-08-05 (×4): qty 1

## 2017-08-05 MED ORDER — CLONIDINE HCL 0.1 MG PO TABS
0.1000 mg | ORAL_TABLET | Freq: Every day | ORAL | Status: DC
  Administered 2017-08-05 – 2017-08-06 (×2): 0.1 mg via ORAL
  Filled 2017-08-05 (×2): qty 1

## 2017-08-05 MED ORDER — ATORVASTATIN CALCIUM 40 MG PO TABS
40.0000 mg | ORAL_TABLET | Freq: Every day | ORAL | Status: DC
  Administered 2017-08-05 – 2017-08-07 (×3): 40 mg via ORAL
  Filled 2017-08-05 (×3): qty 1

## 2017-08-05 MED ORDER — SODIUM CHLORIDE 0.9% FLUSH
3.0000 mL | Freq: Two times a day (BID) | INTRAVENOUS | Status: DC
  Administered 2017-08-05 – 2017-08-07 (×4): 3 mL via INTRAVENOUS

## 2017-08-05 MED ORDER — GABAPENTIN 300 MG PO CAPS
300.0000 mg | ORAL_CAPSULE | Freq: Three times a day (TID) | ORAL | Status: DC
  Administered 2017-08-05 – 2017-08-07 (×5): 300 mg via ORAL
  Filled 2017-08-05 (×5): qty 1

## 2017-08-05 MED ORDER — ASPIRIN 81 MG PO CHEW
324.0000 mg | CHEWABLE_TABLET | Freq: Once | ORAL | Status: AC
  Administered 2017-08-05: 324 mg via ORAL
  Filled 2017-08-05: qty 4

## 2017-08-05 MED ORDER — LORAZEPAM 2 MG/ML IJ SOLN: 1.0000 mg | Freq: Four times a day (QID) | INTRAMUSCULAR | Status: DC | PRN

## 2017-08-05 MED ORDER — APIXABAN 5 MG PO TABS
5.0000 mg | ORAL_TABLET | Freq: Two times a day (BID) | ORAL | Status: DC
  Administered 2017-08-05 – 2017-08-07 (×4): 5 mg via ORAL
  Filled 2017-08-05 (×4): qty 1

## 2017-08-05 MED ORDER — FOLIC ACID 1 MG PO TABS
1.0000 mg | ORAL_TABLET | Freq: Every day | ORAL | Status: DC
  Administered 2017-08-05 – 2017-08-07 (×3): 1 mg via ORAL
  Filled 2017-08-05 (×3): qty 1

## 2017-08-05 MED ORDER — LORAZEPAM 1 MG PO TABS: 1.0000 mg | ORAL_TABLET | Freq: Four times a day (QID) | ORAL | Status: DC | PRN

## 2017-08-05 MED ORDER — POLYETHYLENE GLYCOL 3350 17 G PO PACK: 17.0000 g | PACK | Freq: Every day | ORAL | Status: DC | PRN

## 2017-08-05 MED ORDER — AMLODIPINE BESYLATE 10 MG PO TABS
10.0000 mg | ORAL_TABLET | Freq: Every day | ORAL | Status: DC
  Administered 2017-08-05 – 2017-08-07 (×3): 10 mg via ORAL
  Filled 2017-08-05 (×3): qty 1

## 2017-08-05 MED ORDER — ACETAMINOPHEN 325 MG PO TABS: 650.0000 mg | ORAL_TABLET | Freq: Four times a day (QID) | ORAL | Status: DC | PRN

## 2017-08-05 MED ORDER — VITAMIN B-1 100 MG PO TABS
100.0000 mg | ORAL_TABLET | Freq: Every day | ORAL | Status: DC
  Administered 2017-08-05 – 2017-08-07 (×3): 100 mg via ORAL
  Filled 2017-08-05 (×3): qty 1

## 2017-08-05 MED ORDER — PANTOPRAZOLE SODIUM 40 MG IV SOLR
40.0000 mg | Freq: Once | INTRAVENOUS | Status: AC
  Administered 2017-08-05: 40 mg via INTRAVENOUS
  Filled 2017-08-05: qty 40

## 2017-08-05 MED ORDER — THIAMINE HCL 100 MG/ML IJ SOLN
100.0000 mg | Freq: Every day | INTRAMUSCULAR | Status: DC
  Filled 2017-08-05: qty 2

## 2017-08-05 MED ORDER — INSULIN ASPART 100 UNIT/ML ~~LOC~~ SOLN
0.0000 [IU] | Freq: Three times a day (TID) | SUBCUTANEOUS | Status: DC
  Administered 2017-08-06 (×2): 3 [IU] via SUBCUTANEOUS
  Administered 2017-08-06: 8 [IU] via SUBCUTANEOUS
  Administered 2017-08-07: 3 [IU] via SUBCUTANEOUS

## 2017-08-05 MED ORDER — ADULT MULTIVITAMIN W/MINERALS CH
1.0000 | ORAL_TABLET | Freq: Every day | ORAL | Status: DC
  Administered 2017-08-05 – 2017-08-07 (×3): 1 via ORAL
  Filled 2017-08-05 (×3): qty 1

## 2017-08-05 MED ORDER — INSULIN ASPART 100 UNIT/ML ~~LOC~~ SOLN
0.0000 [IU] | Freq: Every day | SUBCUTANEOUS | Status: DC
  Administered 2017-08-05: 2 [IU] via SUBCUTANEOUS

## 2017-08-05 MED ORDER — PANTOPRAZOLE SODIUM 40 MG PO TBEC
40.0000 mg | DELAYED_RELEASE_TABLET | Freq: Every day | ORAL | Status: DC
  Administered 2017-08-05 – 2017-08-07 (×3): 40 mg via ORAL
  Filled 2017-08-05 (×3): qty 1

## 2017-08-05 NOTE — Telephone Encounter (Signed)
Call received from the patient stating that he is in the hospital . He wanted to schedule a follow up appointment with Dr Jarold Song. He is known to the [facility] ( [facility]) and an appointment was scheduled for him 08/14/17 @ 1400 and the information was placed on the AVS. He stated that he now has medicaid transportation and will call to schedule a ride for that appointment as well as his appointment on 08/12/17 with [facility].  He reported that he is now living in a shared  apartment on [address] that is part of a drug rehab program.  The current rent is $100 /month but he is not able to afford that. He noted that he would eventually  like to have an apartment of his own as the current living conditions are not good.  He said that he has a disability hearing scheduled for 11/19/17 and he is hopeful that he will be approved. He said that he is not able to work any longer because his right foot continues to swell with any extended periods of standing. 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   He noted that he has been getting his medications from [facility] as they deliver.
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
## 2017-08-05 NOTE — ED Triage Notes (Signed)
Pt arrives from home via [facility] reporting R sided CP, no radiation, x 1 week with SOB.  Pt reports new nonproductive cough and diaphoresis today. denies dizziness, fever, chills. Resp e/u at this time.

## 2017-08-05 NOTE — ED Notes (Signed)
This RN made Dr. Johnney Killian aware of pt's pain 10/10.

## 2017-08-06 ENCOUNTER — Observation Stay (HOSPITAL_BASED_OUTPATIENT_CLINIC_OR_DEPARTMENT_OTHER): Payer: Medicaid Other

## 2017-08-06 DIAGNOSIS — M940 Chondrocostal junction syndrome [Tietze]: Principal | ICD-10-CM

## 2017-08-06 DIAGNOSIS — I4892 Unspecified atrial flutter: Secondary | ICD-10-CM | POA: Diagnosis not present

## 2017-08-06 DIAGNOSIS — I342 Nonrheumatic mitral (valve) stenosis: Secondary | ICD-10-CM | POA: Diagnosis not present

## 2017-08-06 DIAGNOSIS — I2510 Atherosclerotic heart disease of native coronary artery without angina pectoris: Secondary | ICD-10-CM

## 2017-08-06 DIAGNOSIS — F1411 Cocaine abuse, in remission: Secondary | ICD-10-CM

## 2017-08-06 DIAGNOSIS — Z9861 Coronary angioplasty status: Secondary | ICD-10-CM

## 2017-08-06 DIAGNOSIS — F1111 Opioid abuse, in remission: Secondary | ICD-10-CM

## 2017-08-06 DIAGNOSIS — I480 Paroxysmal atrial fibrillation: Secondary | ICD-10-CM | POA: Diagnosis not present

## 2017-08-06 DIAGNOSIS — N189 Chronic kidney disease, unspecified: Secondary | ICD-10-CM

## 2017-08-06 DIAGNOSIS — Z7901 Long term (current) use of anticoagulants: Secondary | ICD-10-CM

## 2017-08-06 LAB — COMPREHENSIVE METABOLIC PANEL
ALT: 31 U/L (ref 17–63)
AST: 40 U/L (ref 15–41)
Albumin: 3.5 g/dL (ref 3.5–5.0)
Alkaline Phosphatase: 67 U/L (ref 38–126)
Anion gap: 15 (ref 5–15)
BUN: 14 mg/dL (ref 6–20)
CO2: 23 mmol/L (ref 22–32)
Calcium: 8.6 mg/dL — ABNORMAL LOW (ref 8.9–10.3)
Chloride: 97 mmol/L — ABNORMAL LOW (ref 101–111)
Creatinine, Ser: 1.57 mg/dL — ABNORMAL HIGH (ref 0.61–1.24)
GFR calc Af Amer: 55 mL/min — ABNORMAL LOW (ref >60)
GFR calc non Af Amer: 47 mL/min — ABNORMAL LOW (ref >60)
Glucose, Bld: 140 mg/dL — ABNORMAL HIGH (ref 65–99)
Potassium: 3.3 mmol/L — ABNORMAL LOW (ref 3.5–5.1)
Sodium: 135 mmol/L (ref 135–145)
Total Bilirubin: 0.8 mg/dL (ref 0.3–1.2)
Total Protein: 9 g/dL — ABNORMAL HIGH (ref 6.5–8.1)

## 2017-08-06 LAB — CBC
HCT: 41.6 % (ref 39.0–52.0)
Hemoglobin: 14.5 g/dL (ref 13.0–17.0)
MCH: 32.9 pg (ref 26.0–34.0)
MCHC: 34.9 g/dL (ref 30.0–36.0)
MCV: 94.3 fL (ref 78.0–100.0)
Platelets: 174 10*3/uL (ref 150–400)
RBC: 4.41 MIL/uL (ref 4.22–5.81)
RDW: 12.1 % (ref 11.5–15.5)
WBC: 6.1 10*3/uL (ref 4.0–10.5)

## 2017-08-06 LAB — TROPONIN I
Troponin I: 0.09 ng/mL (ref <0.03)
Troponin I: 0.09 ng/mL (ref <0.03)

## 2017-08-06 LAB — GLUCOSE, CAPILLARY
Glucose-Capillary: 159 mg/dL — ABNORMAL HIGH (ref 65–99)
Glucose-Capillary: 193 mg/dL — ABNORMAL HIGH (ref 65–99)
Glucose-Capillary: 260 mg/dL — ABNORMAL HIGH (ref 65–99)
Glucose-Capillary: 115 mg/dL — ABNORMAL HIGH (ref 65–99)

## 2017-08-06 LAB — LIPID PANEL
Cholesterol: 155 mg/dL (ref 0–200)
HDL: 54 mg/dL (ref >40)
LDL Cholesterol: 84 mg/dL (ref 0–99)
Total CHOL/HDL Ratio: 2.9 RATIO
Triglycerides: 87 mg/dL (ref <150)
VLDL: 17 mg/dL (ref 0–40)

## 2017-08-06 LAB — ECHOCARDIOGRAM COMPLETE
Height: 71 in
Weight: 3171.2 oz

## 2017-08-06 MED ORDER — TRAMADOL HCL 50 MG PO TABS
50.0000 mg | ORAL_TABLET | Freq: Four times a day (QID) | ORAL | 0 refills | Status: DC | PRN
Start: 2017-08-06 — End: 2017-08-07

## 2017-08-06 MED ORDER — DICLOFENAC SODIUM 1 % TD GEL: 4.0000 g | Freq: Four times a day (QID) | TRANSDERMAL | 0 refills | Status: DC

## 2017-08-06 MED ORDER — DICLOFENAC SODIUM 1 % TD GEL
4.0000 g | Freq: Four times a day (QID) | TRANSDERMAL | Status: DC
  Administered 2017-08-06 – 2017-08-07 (×5): 4 g via TOPICAL
  Filled 2017-08-06: qty 100

## 2017-08-06 MED ORDER — CARVEDILOL 6.25 MG PO TABS: 12.5000 mg | ORAL_TABLET | Freq: Two times a day (BID) | ORAL | 1 refills | Status: DC

## 2017-08-06 NOTE — Hospital Discharge Follow-Up (Signed)
Met with the patient today as he is known to [facility] ( [facility]). He clarified that he is currently in housing for the " [employer]" program with hopes to eventually be approved for disability and then move into his own apartment. He has an appointment with [facility] on 08/14/17 @ 1400.   Will continue to follow his hospital course

## 2017-08-06 NOTE — Progress Notes (Signed)
   Subjective: He received one dose of morphine overnight which improved his chest pain. He reports continued pain, reproducible on exam, no current shortness of breath. He states he has an appointment with his Cardiologist Dr. Lemar Livings upcoming on 8/22. Remains in NSR   Objective:  Vital signs in last 24 hours: Vitals:   08/05/17 2239 08/05/17 2240 08/06/17 0511 08/06/17 0944  BP: (!) 168/110 (!) 163/121 (!) 127/96 133/81  Pulse: 83     Resp: 16 12 11    Temp:   97.6 F (36.4 C)   TempSrc:   Oral   SpO2: 97% 97% 95%   Weight:      Height:       Physical Exam  Constitutional: He is oriented to person, place, and time. He appears well-developed and well-nourished. No distress.  Cardiovascular: Normal rate, regular rhythm, normal heart sounds and intact distal pulses.   Pulmonary/Chest: Effort normal.  Musculoskeletal:  Tenderness to palpation of chest wall from R sternal border extending to mid clavicular line   Neurological: He is alert and oriented to person, place, and time.  Skin: Capillary refill takes less than 2 seconds. He is not diaphoretic.     Assessment/Plan:  Costochondritis, R chest wall  Troponin elevated to 0.09, no evidence of ischemic or pericarditis changes on EKG and his prior catheterization from 06/2016 showed mild non-obstructive disease (greatest stenosis of 25%). His baseline troponin is mildly elevated secondary to CKD, prior baselines appear to be ~0.08. CTA without evidence of PE and pt reports compliance with Eliquis. His troponin has remained stable. In light of negative workup, his pain is most likely costochondritis secondary to recent cough.  --F/u echo results --Topical Voltaren gel, Tramadol, Tylenol for pain --Monitor vital signs  Paroxysmal Atrial Fibrillation/Flutter Recent admission for atrial fibrillation/flutter, sinus rhythm on EKG on arrival and on monitor during exam. Carvedilol dose increased and is tolerating well.  --Carvedilol 12.5 mg  BID --Eliquis 5 mg BID    Alcohol and Substance Use History of heroin and cocaine abuse, reports no longer using either drug. Currently drinking about one "forty" a day. UDS negative. --CIWA, folic acid, thiamine, multivitamin  Dispo: Anticipated discharge in approximately 0-1 day(s).   Tawny Asal, MD 08/06/2017, 1:46 PM Pager: [phone]

## 2017-08-06 NOTE — Progress Notes (Signed)
  Echocardiogram 2D Echocardiogram has been performed.  Brad [name] 08/06/2017, 8:45 AM

## 2017-08-06 NOTE — Discharge Instructions (Signed)

## 2017-08-06 NOTE — Discharge Summary (Signed)
Name: Brad [name] MRN: [id number] DOB: 1961-10-03 56 y.o. PCP: Arnoldo Morale, MD  Date of Admission: 08/05/2017 11:26 AM Date of Discharge: 08/07/2017 Attending Physician: No att. providers found  Discharge Diagnosis: Active Problems:   Chest pain   Discharge Medications: Allergies as of 08/07/2017      Reactions   Lisinopril Anaphylaxis   Throat swelling   Angiotensin Receptor Blockers Other (See Comments)   (Angioedema with lisinopril, therefore ARB's are contraindicated)   Pamelor [nortriptyline Hcl] Swelling      Medication List    STOP taking these medications   cloNIDine 0.1 MG tablet Commonly known as:  CATAPRES     TAKE these medications   amitriptyline 75 MG tablet Commonly known as:  ELAVIL Take 75 mg by mouth at bedtime.   amLODipine 10 MG tablet Commonly known as:  NORVASC Take 1 tablet (10 mg total) by mouth daily.   apixaban 5 MG Tabs tablet Commonly known as:  ELIQUIS Take 1 tablet (5 mg total) by mouth 2 (two) times daily.   atorvastatin 20 MG tablet Commonly known as:  LIPITOR Take 2 tablets (40 mg total) by mouth daily.   carvedilol 6.25 MG tablet Commonly known as:  COREG Take 2 tablets (12.5 mg total) by mouth 2 (two) times daily with a meal. What changed:  how much to take   diclofenac sodium 1 % Gel Commonly known as:  VOLTAREN Apply 4 g topically 4 (four) times daily.   gabapentin 300 MG capsule Commonly known as:  NEURONTIN Take 1 capsule (300 mg total) by mouth 3 (three) times daily. What changed:  how much to take  when to take this   hydrocortisone 2.5 % cream Apply topically 2 (two) times daily.   Insulin Glargine 100 UNIT/ML Solostar Pen Commonly known as:  LANTUS SOLOSTAR Inject 50 Units into the skin daily at 10 pm. What changed:  how much to take   naltrexone 50 MG tablet Commonly known as:  DEPADE Take 1 tablet (50 mg total) by mouth daily.   omeprazole 20 MG capsule Commonly known as:  PRILOSEC Take  1 capsule (20 mg total) by mouth daily.            Durable Medical Equipment        Start     Ordered   08/07/17 0000  For home use only DME Cane     08/07/17 1354      Disposition and follow-up:   Brad [name] was discharged from [facility] in Good condition.  At the hospital follow up visit please address:  1.  --Assess for resolution of chest pain and cough  --Ensure he remains adherent to medications in setting of difficult social circumstances, consider ACE/ARB (with close monitoring of renal function) for HF benefit  --ID follow up of hepatitis C with mild hepatic cirrhosis on imaging  2.  Labs / imaging needed at time of follow-up: None  3.  Pending labs/ test needing follow-up: None  Follow-up Appointments: Follow-up Information    [facility]. Go on 08/14/2017.   Why:  at 2:00pm for an appointment with Dr Lisette Grinder information: [place] [phone]       [facility] - Dme Follow up.   Why:  cane arranged- to be delivered to room prior to discharged Contact information: [address] [place] [address] [phone]  Hospital Course by problem list:   Costochondritis, R chest wall  Pt presented in reproducible right chest wall pain in the setting of recent cough. His troponin was elevated at 0.09, however on chart review he has a baseline troponin elevation in the setting of his CKD and 0.09 is consistent with his baseline. Troponin trend remained stable without increase, no ischemic changes on EKG, and he has had a recent catheterization 1 yr ago without significant occlusive CAD. CTA was negative for PE or other more sinister etiologies. His pain was treated symptomatically and improved, discharged home with voltaren topical gel.   HFrEF TTE this admission showed EF of 35-40% and LV dilation, both of which were  worsened compared to prior study in 08/2016. His Coreg dose was increased, he is currently on a statin. ACE/ARB may offer further benefit for HF, was not started prior to discharge. He has close follow up scheduled with his primary Cardiologist for further management of his progressing HF. He was counseled on the importance of discontinuing his alcohol use for cardiac benefit.   Atrial Fibrillation/Flutter Pt had recent admission with atrial fibrillation, flutter on home regimen of Coreg and Eliquis. He was in NSR for the duration of this admission, his Coreg dose was increased as above and Eliquis was continued.   Alcohol Use Disorder Pt reports drinking about one "forty" per day. Counseled on the negative effects of alcohol on his cardiac function and encouraged him to stop drinking. He reports interest and was given a prescription for Naltrexone to aid in cutting back.   Hypertension Clonidine remains on his home medication list, however on chart review, was intended to be discontinued following his last admission. It was again discontinued. His BP prior to discharge was well-controlled and increased dose of Coreg may also benefit HTN. His amlodipine was continued.   Diabetes Mellitus Pt reports taking Lantus 50 U BID at home. On chart review, he has been intended to use his insulin nightly after multiple visits. His blood glucose was maintained with Lantus 30 U nightly and correctional SSI. The patient was counseled to use Lantus 50 U nightly on discharge and to address his regimen at follow up with his primary care physician at the [facility] and [facility]. His medication list was updated to reflect this dose.   Hepatitis C Positive, Mild Hepatic Cirrhosis  Pt noted to have nodularity of the liver on CTA, RUQ [place] one year prior noted nodular contour and concern for mild hepatic cirrhosis. Prior labs showed Hepatitis C infection. Referral made for ID follow up as an outpatient.     Discharge Vitals:   BP (!) 112/93 (BP Location: Right Arm)   Pulse 68   Temp 97.6 F (36.4 C) (Oral)   Resp 12   Ht 5\' 11"  (1.803 m)   Wt 201 lb 8 oz (91.4 kg)   SpO2 97%   BMI 28.10 kg/m   Pertinent Labs, Studies, and Procedures:  TTE: -Left ventricle: The cavity size was moderately dilated. Wall   thickness was increased in a pattern of mild LVH. Left   ventricular geometry showed evidence of eccentric hypertrophy.   Systolic function was moderately reduced. The estimated ejection   fraction was in the range of 35% to 40%. Wall motion was normal;   there were no regional wall motion abnormalities  - Left ventricular systolic function has decreased and the left   ventricle has dilated since September 2017.  Discharge Instructions: Discharge Instructions  Ambulatory referral to Infectious Disease    Complete by:  As directed    Hepatitis C+ in the past, mild hepatic cirrhosis   Diet - low sodium heart healthy    Complete by:  As directed    Discharge instructions    Complete by:  As directed    --You have an appointment at the [facility] on Aug 24th at 2 pm  --Also keep your appointment with Dr. Debara Pickett coming up on the 22nd  --We increased your carvedilol medicine to a higher dose. Take two pills twice a day instead of one twice a day. --Also, for your insulin, inject 50 U Lantus nightly for now and talk with your doctor at the [facility] if more changes need to be made  --We wrote a prescription for Naltrexone. Take this once a day to help you stop drinking. It will be really important to stop drinking alcohol to help protect your heart    --We also made a referral to infectious disease because you have a history of hepatitis C infection in the past --We put in an order for a cane to help with walking --Talk with your doctor at the [facility] and [facility] about prescription options for viagra   For home use  only DME Cane    Complete by:  As directed    Increase activity slowly    Complete by:  As directed       Signed: Tawny Asal, MD 08/07/2017, 5:32 PM   Pager: [phone]

## 2017-08-07 DIAGNOSIS — K7460 Unspecified cirrhosis of liver: Secondary | ICD-10-CM | POA: Diagnosis present

## 2017-08-07 DIAGNOSIS — E1122 Type 2 diabetes mellitus with diabetic chronic kidney disease: Secondary | ICD-10-CM | POA: Diagnosis present

## 2017-08-07 DIAGNOSIS — Z590 Homelessness: Secondary | ICD-10-CM | POA: Diagnosis not present

## 2017-08-07 DIAGNOSIS — Z7901 Long term (current) use of anticoagulants: Secondary | ICD-10-CM | POA: Diagnosis not present

## 2017-08-07 DIAGNOSIS — Z87898 Personal history of other specified conditions: Secondary | ICD-10-CM | POA: Diagnosis not present

## 2017-08-07 DIAGNOSIS — N183 Chronic kidney disease, stage 3 (moderate): Secondary | ICD-10-CM | POA: Diagnosis present

## 2017-08-07 DIAGNOSIS — I480 Paroxysmal atrial fibrillation: Secondary | ICD-10-CM | POA: Diagnosis present

## 2017-08-07 DIAGNOSIS — I5042 Chronic combined systolic (congestive) and diastolic (congestive) heart failure: Secondary | ICD-10-CM | POA: Diagnosis present

## 2017-08-07 DIAGNOSIS — I4892 Unspecified atrial flutter: Secondary | ICD-10-CM | POA: Diagnosis present

## 2017-08-07 DIAGNOSIS — E1140 Type 2 diabetes mellitus with diabetic neuropathy, unspecified: Secondary | ICD-10-CM | POA: Diagnosis present

## 2017-08-07 DIAGNOSIS — M1990 Unspecified osteoarthritis, unspecified site: Secondary | ICD-10-CM | POA: Diagnosis present

## 2017-08-07 DIAGNOSIS — R079 Chest pain, unspecified: Secondary | ICD-10-CM | POA: Diagnosis present

## 2017-08-07 DIAGNOSIS — G629 Polyneuropathy, unspecified: Secondary | ICD-10-CM | POA: Diagnosis present

## 2017-08-07 DIAGNOSIS — Z609 Problem related to social environment, unspecified: Secondary | ICD-10-CM | POA: Diagnosis present

## 2017-08-07 DIAGNOSIS — I2510 Atherosclerotic heart disease of native coronary artery without angina pectoris: Secondary | ICD-10-CM | POA: Diagnosis present

## 2017-08-07 DIAGNOSIS — R7989 Other specified abnormal findings of blood chemistry: Secondary | ICD-10-CM | POA: Diagnosis present

## 2017-08-07 DIAGNOSIS — I130 Hypertensive heart and chronic kidney disease with heart failure and stage 1 through stage 4 chronic kidney disease, or unspecified chronic kidney disease: Secondary | ICD-10-CM | POA: Diagnosis present

## 2017-08-07 DIAGNOSIS — F1010 Alcohol abuse, uncomplicated: Secondary | ICD-10-CM | POA: Diagnosis present

## 2017-08-07 DIAGNOSIS — F329 Major depressive disorder, single episode, unspecified: Secondary | ICD-10-CM | POA: Diagnosis present

## 2017-08-07 DIAGNOSIS — R55 Syncope and collapse: Secondary | ICD-10-CM | POA: Insufficient documentation

## 2017-08-07 DIAGNOSIS — E785 Hyperlipidemia, unspecified: Secondary | ICD-10-CM | POA: Diagnosis present

## 2017-08-07 DIAGNOSIS — M940 Chondrocostal junction syndrome [Tietze]: Secondary | ICD-10-CM | POA: Diagnosis present

## 2017-08-07 DIAGNOSIS — F17210 Nicotine dependence, cigarettes, uncomplicated: Secondary | ICD-10-CM | POA: Diagnosis present

## 2017-08-07 DIAGNOSIS — K219 Gastro-esophageal reflux disease without esophagitis: Secondary | ICD-10-CM | POA: Diagnosis present

## 2017-08-07 DIAGNOSIS — B1920 Unspecified viral hepatitis C without hepatic coma: Secondary | ICD-10-CM | POA: Diagnosis present

## 2017-08-07 DIAGNOSIS — I447 Left bundle-branch block, unspecified: Secondary | ICD-10-CM | POA: Diagnosis present

## 2017-08-07 LAB — GLUCOSE, CAPILLARY
Glucose-Capillary: 191 mg/dL — ABNORMAL HIGH (ref 65–99)
Glucose-Capillary: 165 mg/dL — ABNORMAL HIGH (ref 65–99)

## 2017-08-07 MED ORDER — NALTREXONE HCL 50 MG PO TABS: 50.0000 mg | ORAL_TABLET | Freq: Every day | ORAL | 2 refills | Status: DC

## 2017-08-07 MED ORDER — INSULIN GLARGINE 100 UNIT/ML SOLOSTAR PEN: 50.0000 [IU] | PEN_INJECTOR | Freq: Every day | SUBCUTANEOUS | 2 refills | Status: DC

## 2017-08-07 NOTE — Care Management Note (Signed)
Case Management Note Marvetta Gibbons RN, BSN Unit 4E-Case Manager [phone]  Patient Details  Name: Brad [name] MRN: [id number] Date of Birth: 09/29/1961  Subjective/Objective:     Pt presented with chest pain- placed in observation               Action/Plan: PTA pt was ?homeless vs having an apartment- followed by the [facility]- [facility]- has an appointment on 8/24 at 1400- per [facility] note pt states he has housing- pt was started on Eliquis on his last admission- and it is a medicaid covered medication- CM to follow for any d/c needs  Expected Discharge Date:                  Expected Discharge Plan:  Home/Self Care  In-House Referral:  Clinical Social Work  Discharge planning Services  CM Consult  Post Acute Care Choice:  NA Choice offered to:  NA  DME Arranged:    DME Agency:     HH Arranged:    [place] Agency:     Status of Service:  Completed, signed off  If discussed at [employer] of Stay Meetings, dates discussed:    Discharge Disposition:   Additional Comments:  [name], RN 08/07/2017, 10:21 AM

## 2017-08-07 NOTE — Progress Notes (Signed)
   Subjective: No acute events overnight, pt reports his chest pain has improved. Discussed echo results showing decrease in his systolic function and importance of alcohol cessation, pt expresses interest and motivation in stopping alcohol intake. Also states he has trouble standing and ambulation and requesting cane for better ambulation. Previously used walker in post-operative period of ankle surgery.   Objective:  Vital signs in last 24 hours: Vitals:   08/06/17 0944 08/06/17 1428 08/06/17 2005 08/07/17 0414  BP: 133/81 120/86 127/84 (!) 112/93  Pulse:  64 65 69  Resp:  12    Temp:  (!) 97.5 F (36.4 C) 98.3 F (36.8 C) 97.8 F (36.6 C)  TempSrc:  Oral Oral Oral  SpO2:  97%    Weight:    201 lb 8 oz (91.4 kg)  Height:       Physical Exam  Constitutional: He is oriented to person, place, and time. He appears well-developed and well-nourished. No distress.  Cardiovascular: Normal rate, regular rhythm, normal heart sounds and intact distal pulses.   Pulmonary/Chest: Effort normal.  Musculoskeletal:  Improved ttp of chest wall from R sternal border extending to mid clavicular line   Neurological: He is alert and oriented to person, place, and time.  Skin: Capillary refill takes less than 2 seconds. He is not diaphoretic.    Assessment/Plan:  Costochondritis, R chest wall  Troponin elevated to 0.09, no evidence of ischemic or pericarditis changes on EKG and his prior catheterization from 06/2016 showed mild non-obstructive disease (greatest stenosis of 25%). His baseline troponin is mildly elevated secondary to CKD, prior baselines appear to be ~0.08. CTA without evidence of PE and pt reports compliance with Eliquis. His troponin has remained stable. In light of negative workup, his pain is most likely costochondritis secondary to recent cough. Echo showed interval decrease in systolic function EF 70-78%, and increase in LV dilation.  --Topical Voltaren gel, Tylenol for  pain --Monitor vital signs  Paroxysmal Atrial Fibrillation/Flutter Recent admission for atrial fibrillation/flutter, sinus rhythm on EKG on arrival and on monitor during exam. Carvedilol dose increased and is tolerating well.  --Carvedilol 12.5 mg BID --Eliquis 5 mg BID    Alcohol and Substance Use History of heroin and cocaine abuse, reports no longer using either drug. Currently drinking about one "forty" a day. UDS negative. --CIWA, folic acid, thiamine, multivitamin  Dispo: Anticipated discharge today  Tawny Asal, MD 08/07/2017, 2:09 PM Pager: [phone]

## 2017-08-07 NOTE — Progress Notes (Signed)
Clinical Social Worker received verbal consult for patient "stating patient might be homeless". CSW spoke to patient at bedside and he stated he lives in an apartment that he pays $100 for. Patient stated the apartment is for individuals recovering from substance abuse. Patient stated every day he get picked up and goes to class that helps patient stay clean. Patient stated he will need help with transportation and would like a bus pass. CSW stated she will leave bus pass with RN until patient is ready for D/C.  Rhea Pink, MSW,  [place]

## 2017-08-07 NOTE — Progress Notes (Signed)
Discharged to home with family office visits in place teaching done  

## 2017-08-10 ENCOUNTER — Telehealth: Payer: Self-pay

## 2017-08-10 NOTE — Telephone Encounter (Signed)
[facility] Post-discharge Follow-Up Phone Call:  Date of Discharge: 08/07/17 Principal Discharge Diagnosis(es): costochondritis, chest pain, heart failure, atrial fibrillation/flutter Post-discharge Communication: (Clearly document all attempts clearly and date contact made) call placed to # [phone] (M).  This number was noted as the patient's number as well as the # for Mr Ace Gins ( emergency contact ).  Mr Ace Gins answered and stated that this # is for his phone as the patient does not have a phone at this time. Mr Ace Gins stated that he thought the patient was on his way to [facility] because of a cardiac arrythmia.  Requested that Mr Ace Gins have the patient return the call to this CM # [phone] and he said that he would when he sees him,   Call Completed: No

## 2017-08-11 ENCOUNTER — Telehealth: Payer: Self-pay | Admitting: Family Medicine

## 2017-08-11 NOTE — Telephone Encounter (Signed)
[facility]) informed me that she had spoken to a rep from the [employer] in regards to helping patient with housing. Rep informed Delana Meyer that in order to move with the paperwork/process patient would need to call [employer] and verify his information with them. The number patient needs to call is [phone] ext [phone].   Call placed to patient. No answer. Left a message to return my call at [phone].

## 2017-08-12 ENCOUNTER — Ambulatory Visit: Payer: Medicaid Other | Admitting: Physician Assistant

## 2017-08-14 ENCOUNTER — Inpatient Hospital Stay: Payer: Self-pay | Admitting: Family Medicine

## 2017-08-14 IMAGING — CT CT ANGIO CHEST
2 of 6 series · 19 of 36 positions shown · IV contrast (Omni 300)
Comparison: CT chest 05/27/2016.

CLINICAL DATA: Chest pain, shortness of breath and diaphoresis
beginning at 6 a.m. this morning.

EXAM:
CT ANGIOGRAPHY CHEST WITH CONTRAST
TECHNIQUE: Multidetector CT imaging of the chest was performed using the
standard protocol during bolus administration of intravenous
contrast. Multiplanar CT image reconstructions and MIPs were
obtained to evaluate the vascular anatomy.
CONTRAST:  70 cc Isovue 370.

[Series 7: pe thins · axial · 0.65mm/px · z∈[+1032,+1299]mm · 18 of 422 slices shown]
[im 20/422  lung]
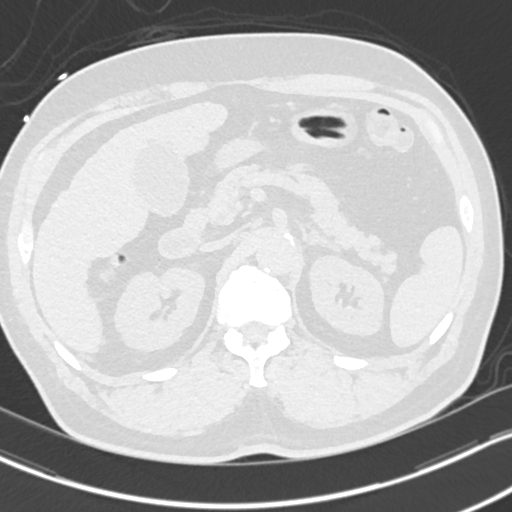
[im 39/422  mediastinal]
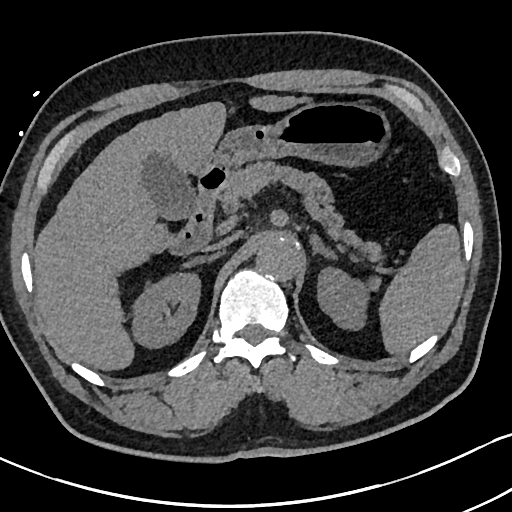
[im 58/422  lung]
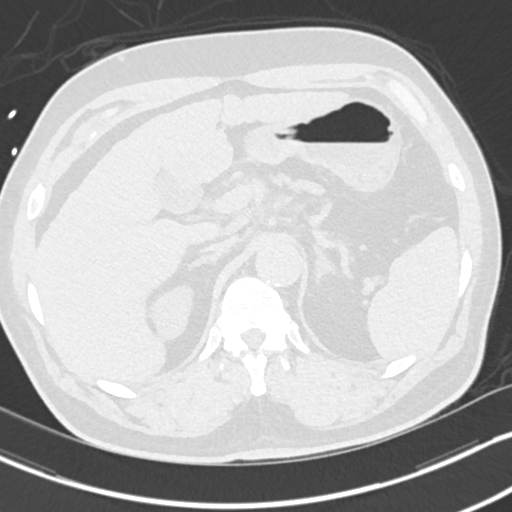
[im 96/422  mediastinal]
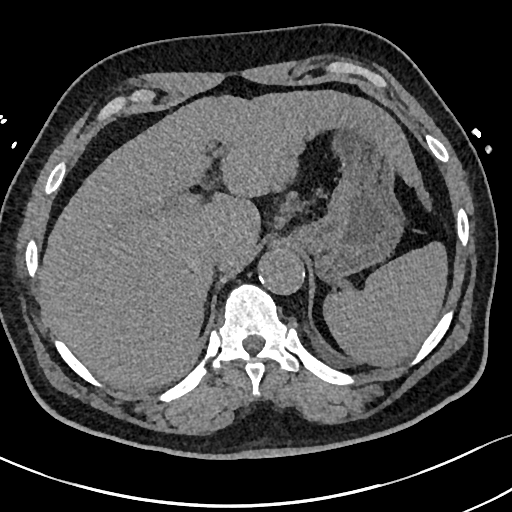
[im 115/422  lung]
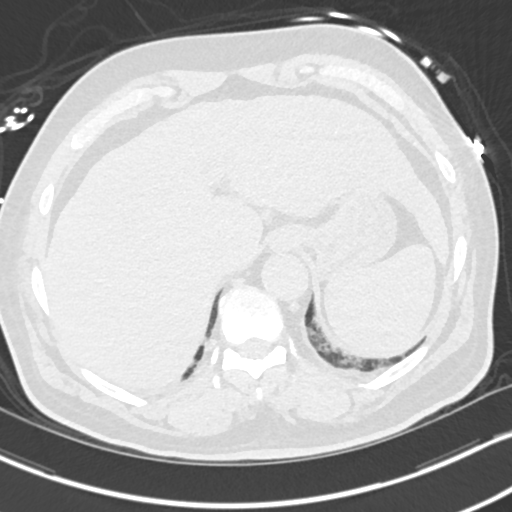
[im 134/422  mediastinal]
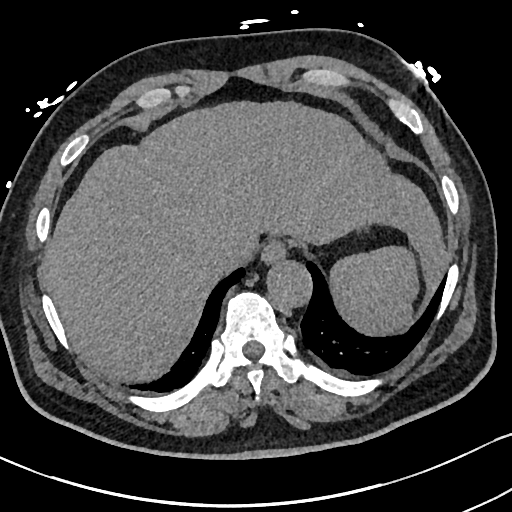
[im 154/422  lung]
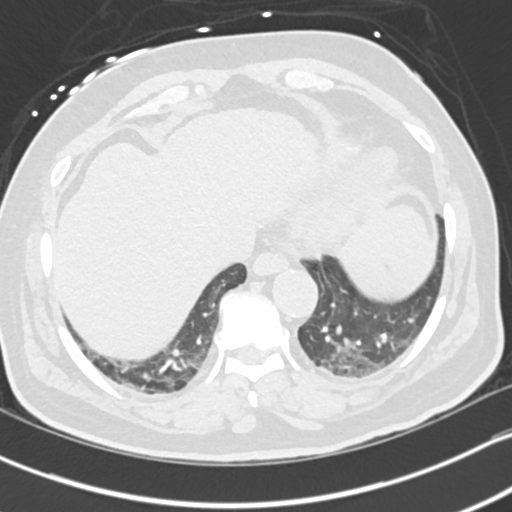
[im 173/422  mediastinal]
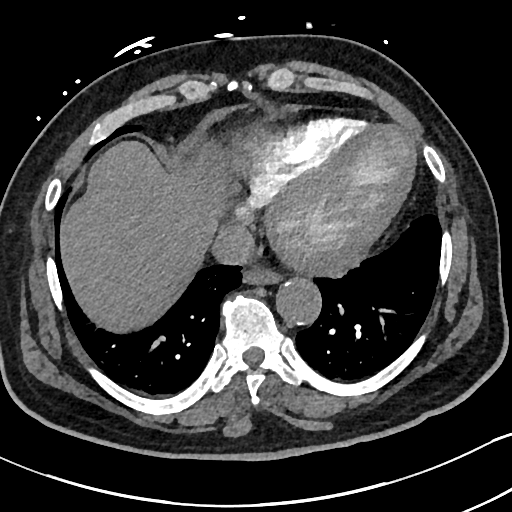
[im 192/422  lung]
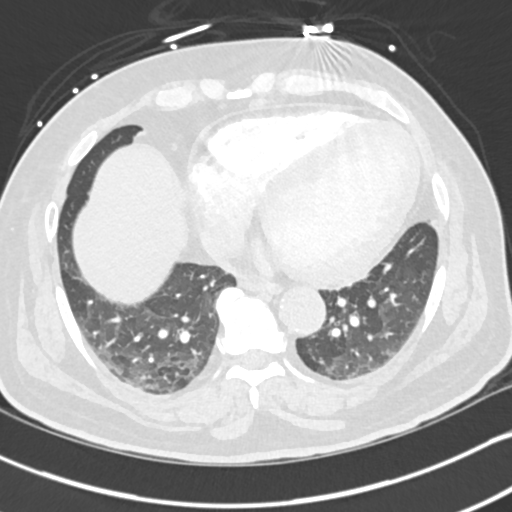
[im 230/422  mediastinal]
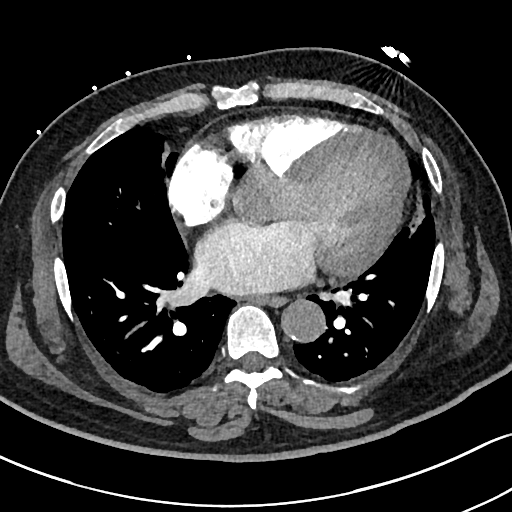
[im 249/422  lung]
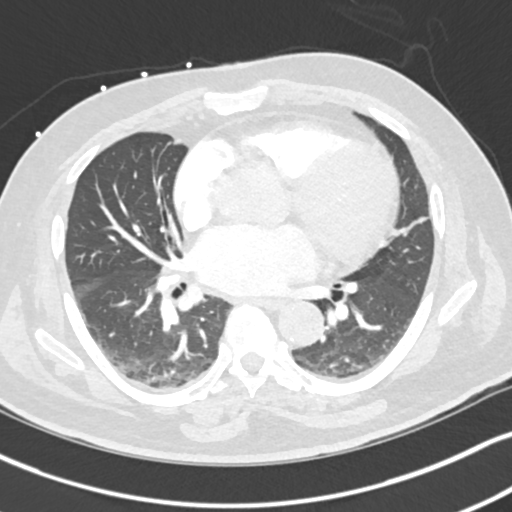
[im 268/422  mediastinal]
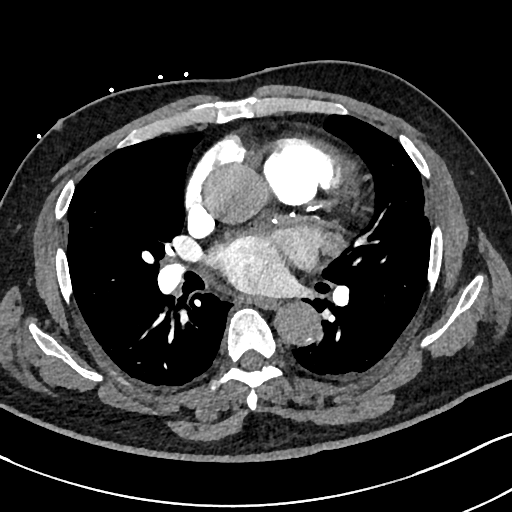
[im 288/422  lung]
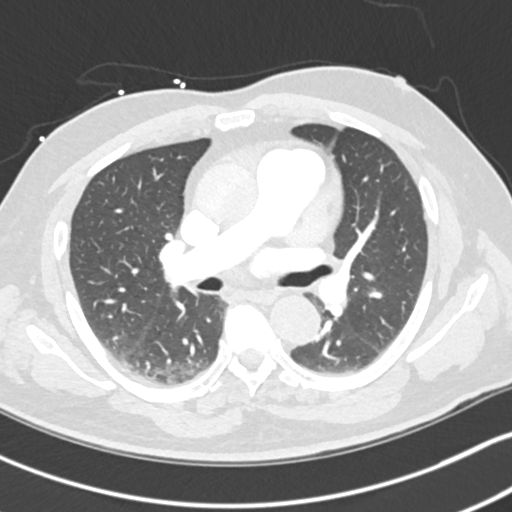
[im 307/422  mediastinal]
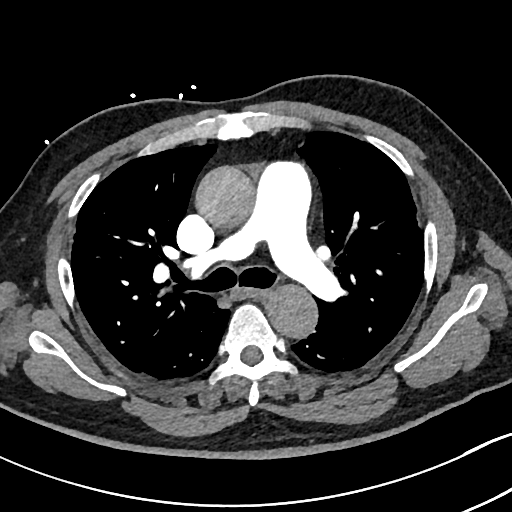
[im 326/422  lung]
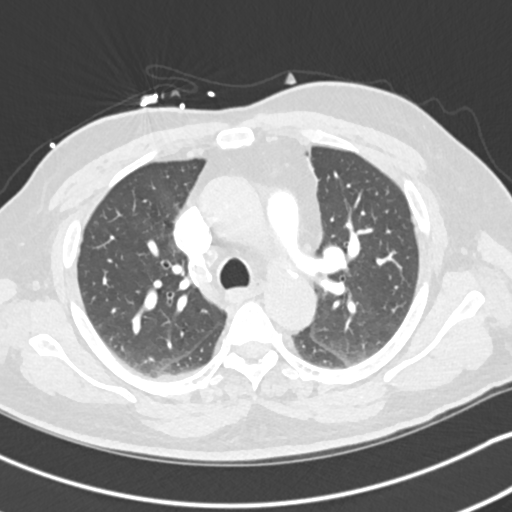
[im 364/422  mediastinal]
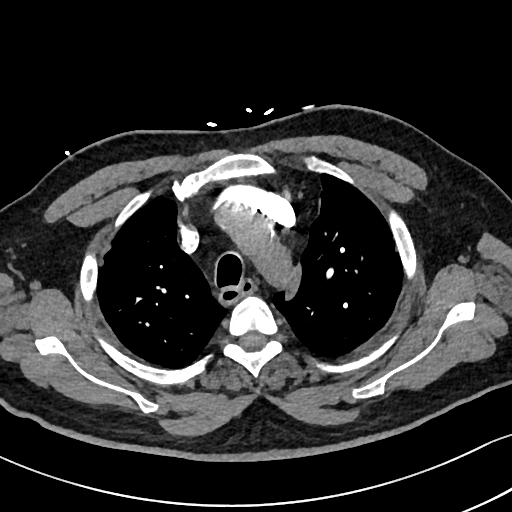
[im 383/422  lung]
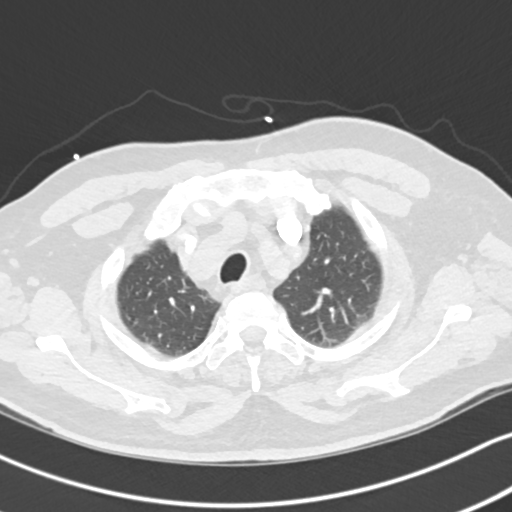
[im 402/422  mediastinal]
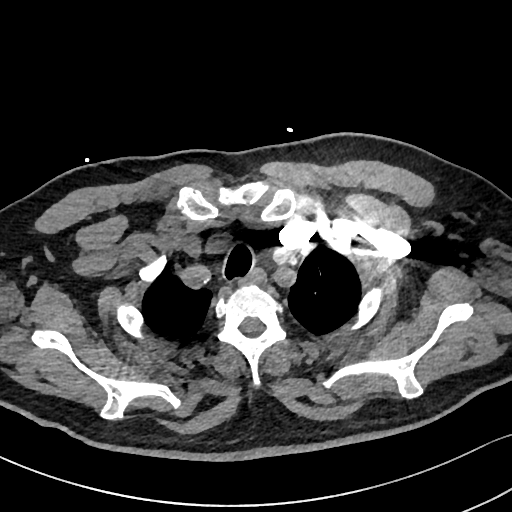

[Series 8: pe 2mm cor · coronal · 0.59mm/px · 1 of 100 slices shown]
[im 50/100  mediastinal]
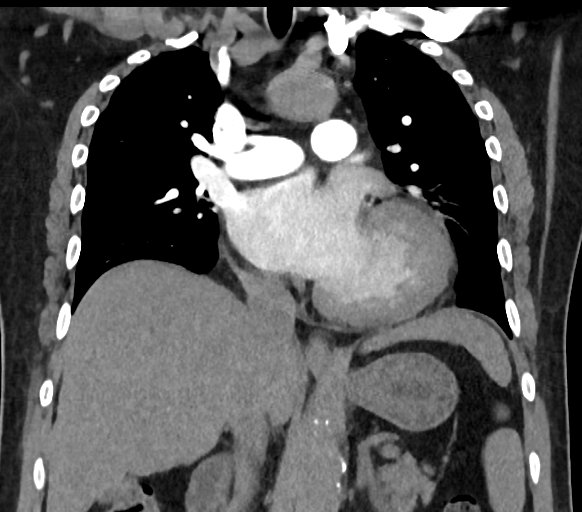

[19 of 36 positions shown; findings below may reference images not displayed]

FINDINGS: No pulmonary embolus is identified. There is marked cardiomegaly.
Calcific aortic and coronary atherosclerosis is identified. No
pleural or pericardial effusion. The lungs demonstrate some
dependent atelectatic change.

Visualized upper abdomen shows cirrhotic change of the liver. There
is some thoracic spondylosis. No lytic or sclerotic bony lesion is
identified.

Review of the MIP images confirms the above findings.
IMPRESSION: Negative for pulmonary embolus or acute disease.

Calcific aortic and coronary atherosclerosis.

Marked cardiomegaly.

Cirrhosis.

## 2017-08-14 IMAGING — CR DG CHEST 1V PORT
1 series · 1 of 1 positions shown · non-contrast
Comparison: Chest x-ray a 05/27/2016

CLINICAL DATA: Shortness of breath today. History of prostate
cancer.

EXAM:
PORTABLE CHEST 1 VIEW

[portable]
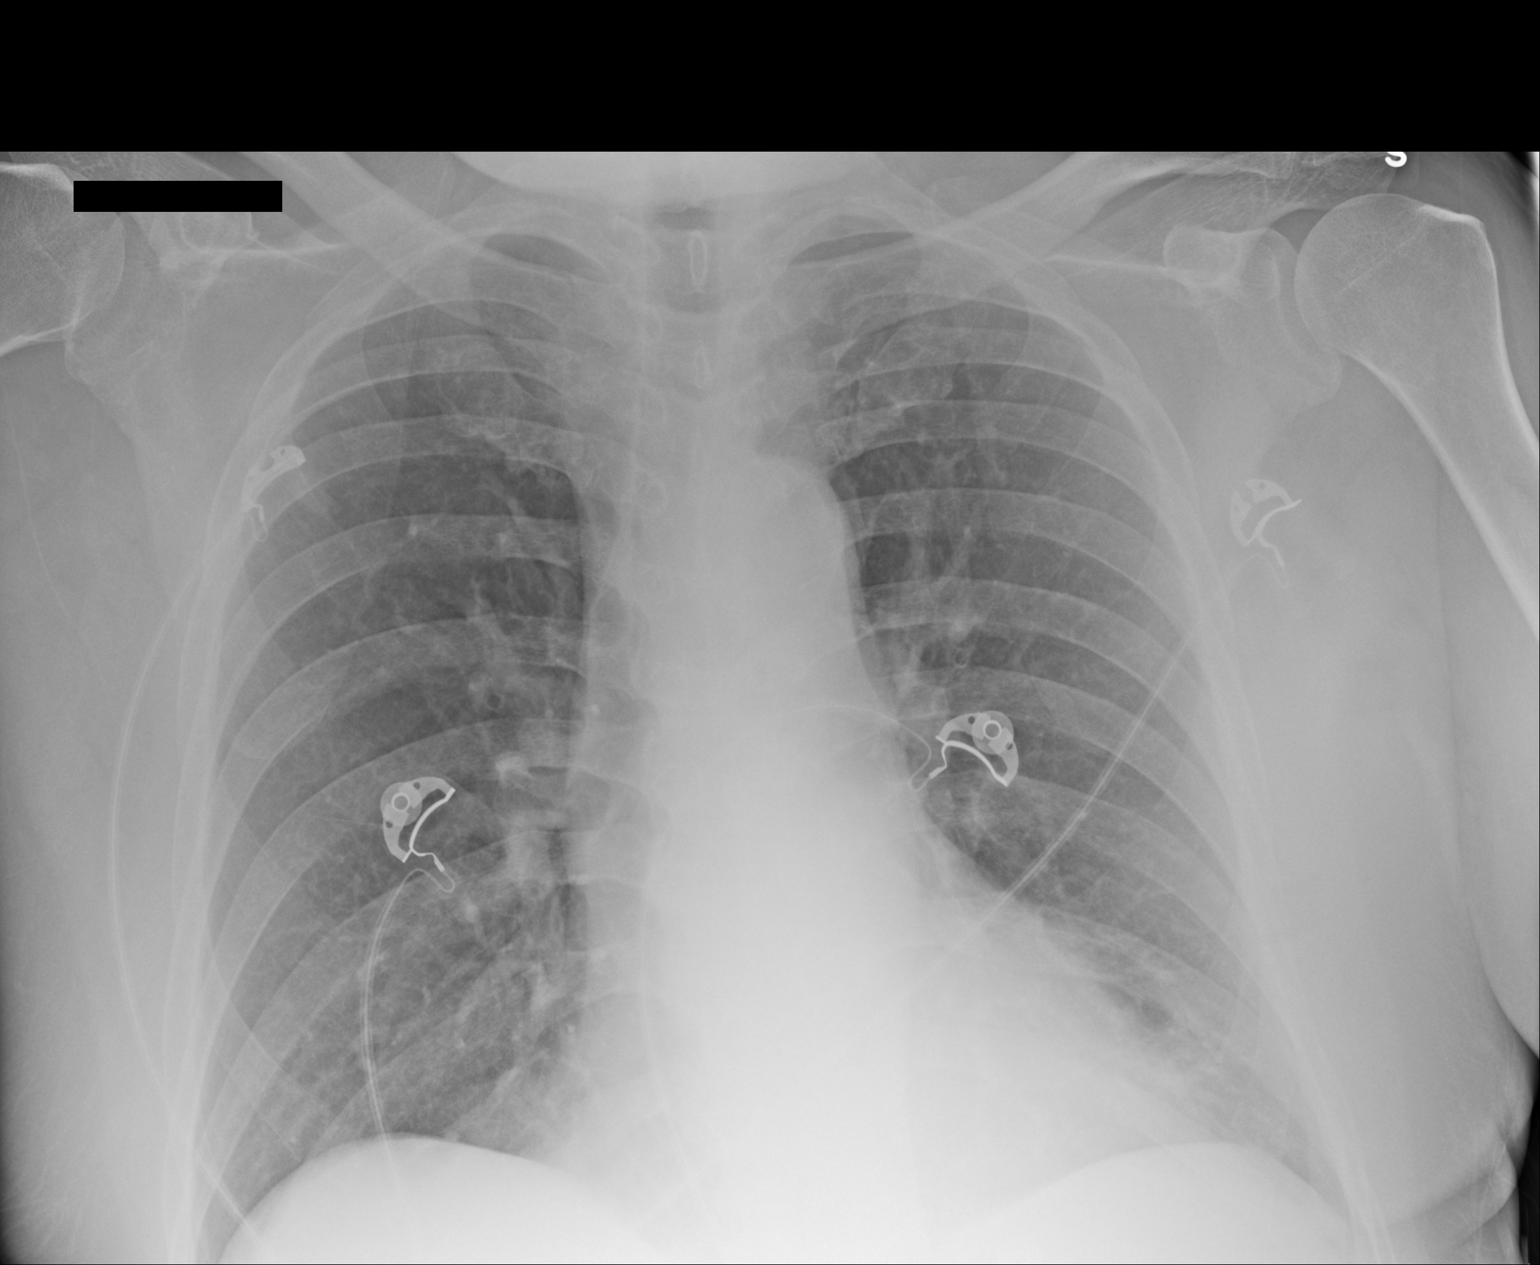

[1 of 1 positions shown; findings below may reference images not displayed]

FINDINGS: The cardiac silhouette, mediastinal hilar contours are within normal
limits and stable. Stable tortuosity and calcification of the
thoracic aorta. Low lung volumes with vascular crowding and
bibasilar atelectasis. No definite infiltrates or effusions. The
bony thorax is intact.
IMPRESSION: No acute cardiopulmonary findings.

## 2017-08-18 ENCOUNTER — Other Ambulatory Visit: Payer: Self-pay | Admitting: Family Medicine

## 2017-08-18 DIAGNOSIS — I10 Essential (primary) hypertension: Secondary | ICD-10-CM

## 2017-08-18 DIAGNOSIS — E1140 Type 2 diabetes mellitus with diabetic neuropathy, unspecified: Secondary | ICD-10-CM

## 2017-08-19 IMAGING — CR DG ANKLE COMPLETE 3+V*R*
3 series · 3 of 3 positions shown · non-contrast
Comparison: Radiographs dated 07/30/2016 and 07/24/2016 and CT scan
of the ankle dated 07/24/2016

CLINICAL DATA: Right ankle pain secondary to a fall yesterday.

EXAM:
RIGHT ANKLE - COMPLETE 3+ VIEW

[ankle ap]
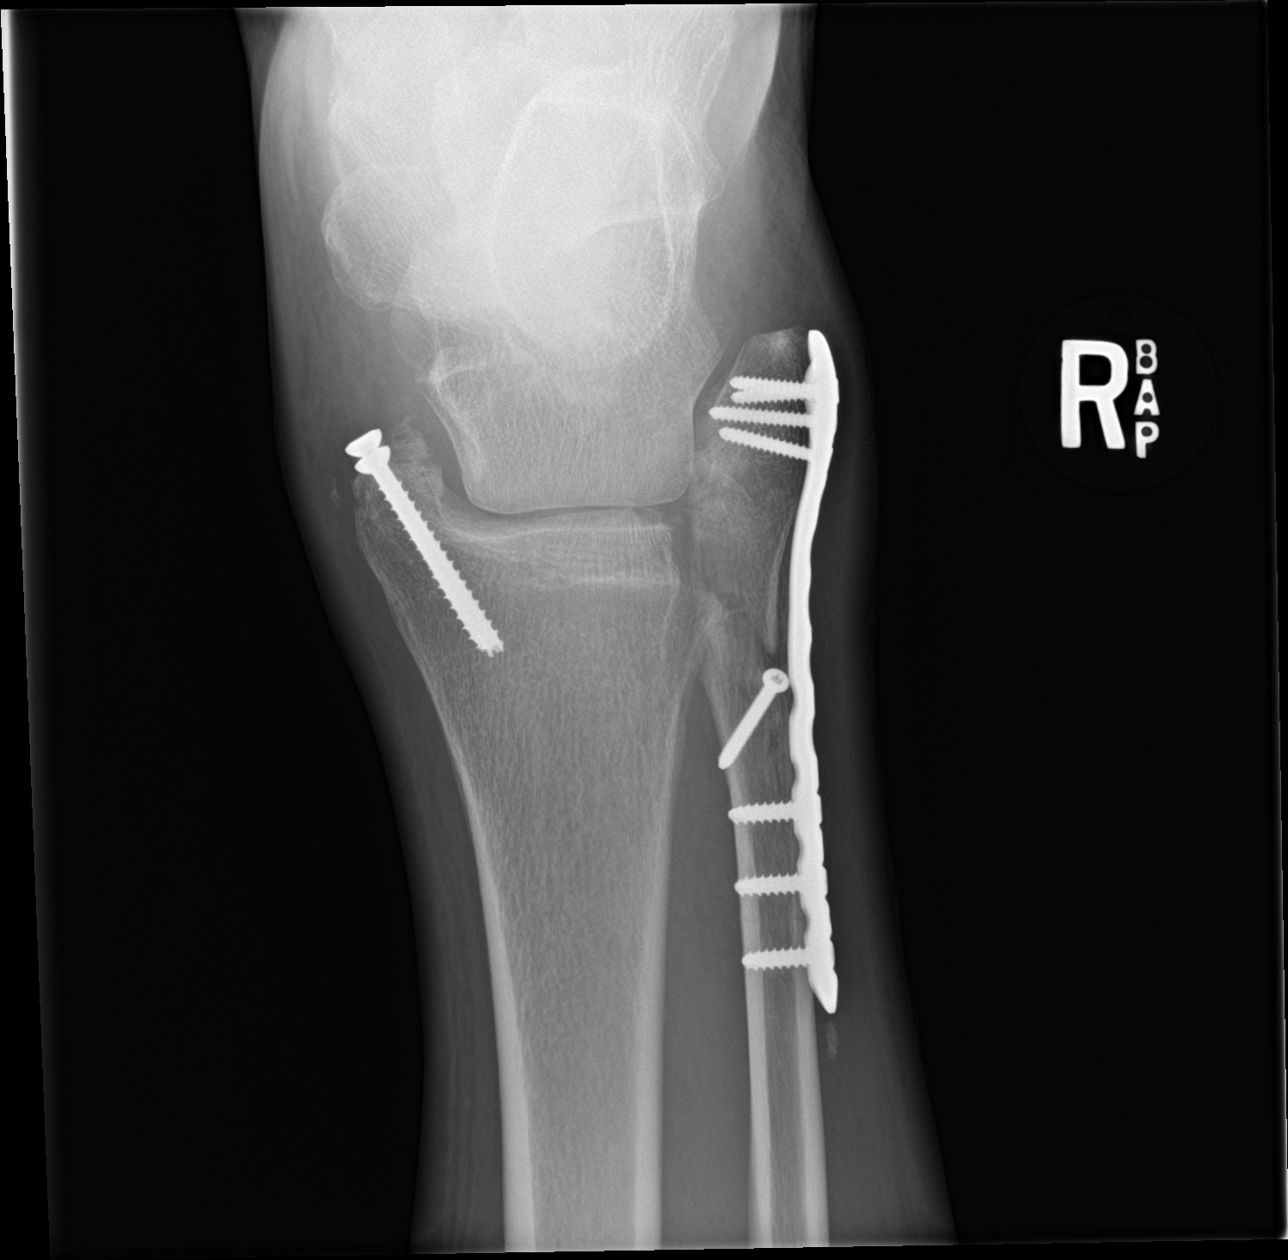

[ankle obl]
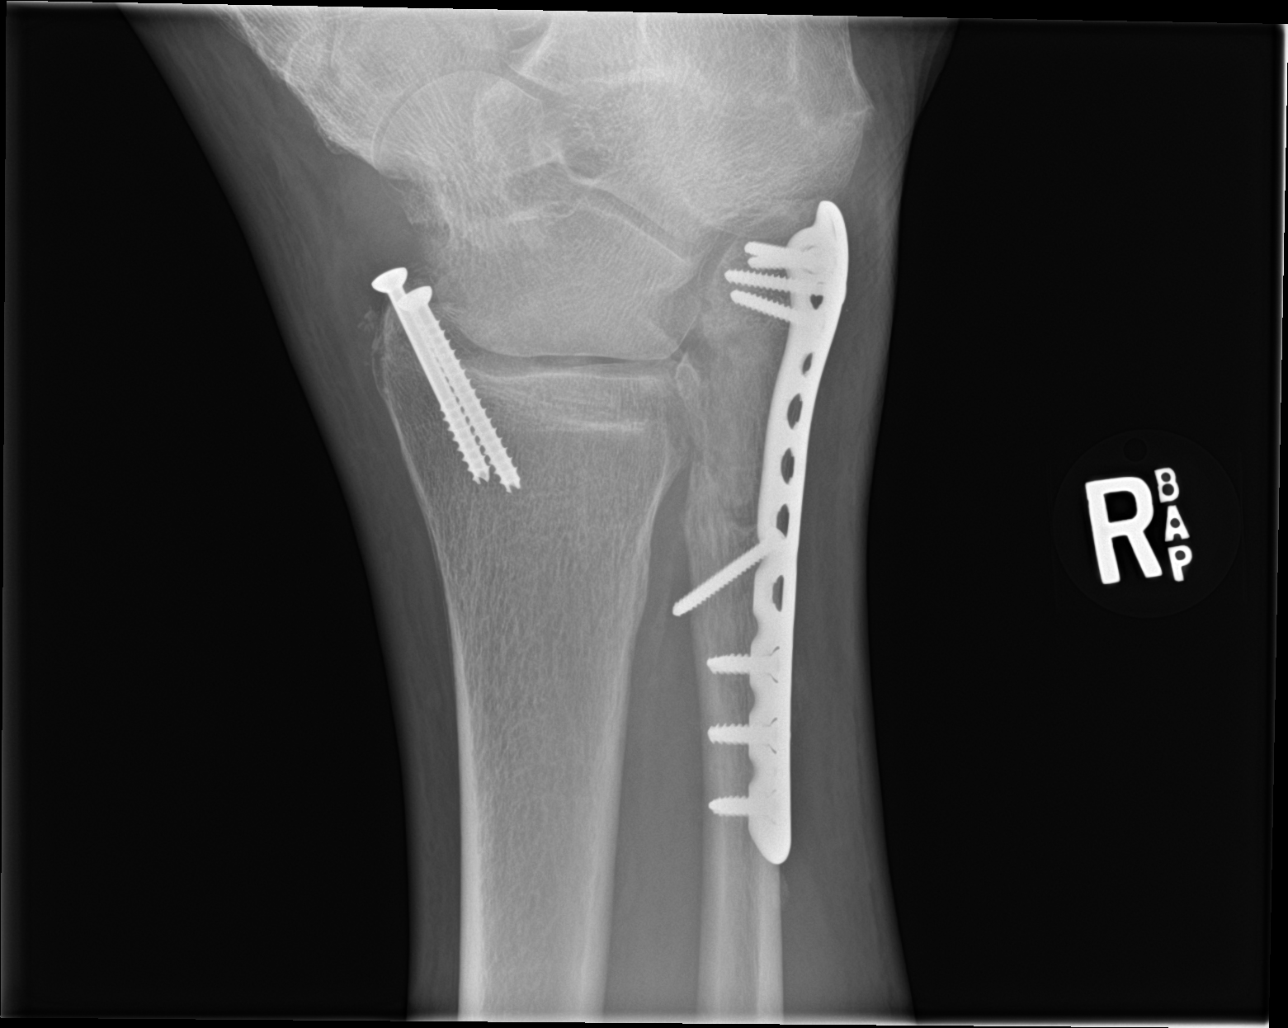

[ankle lat]
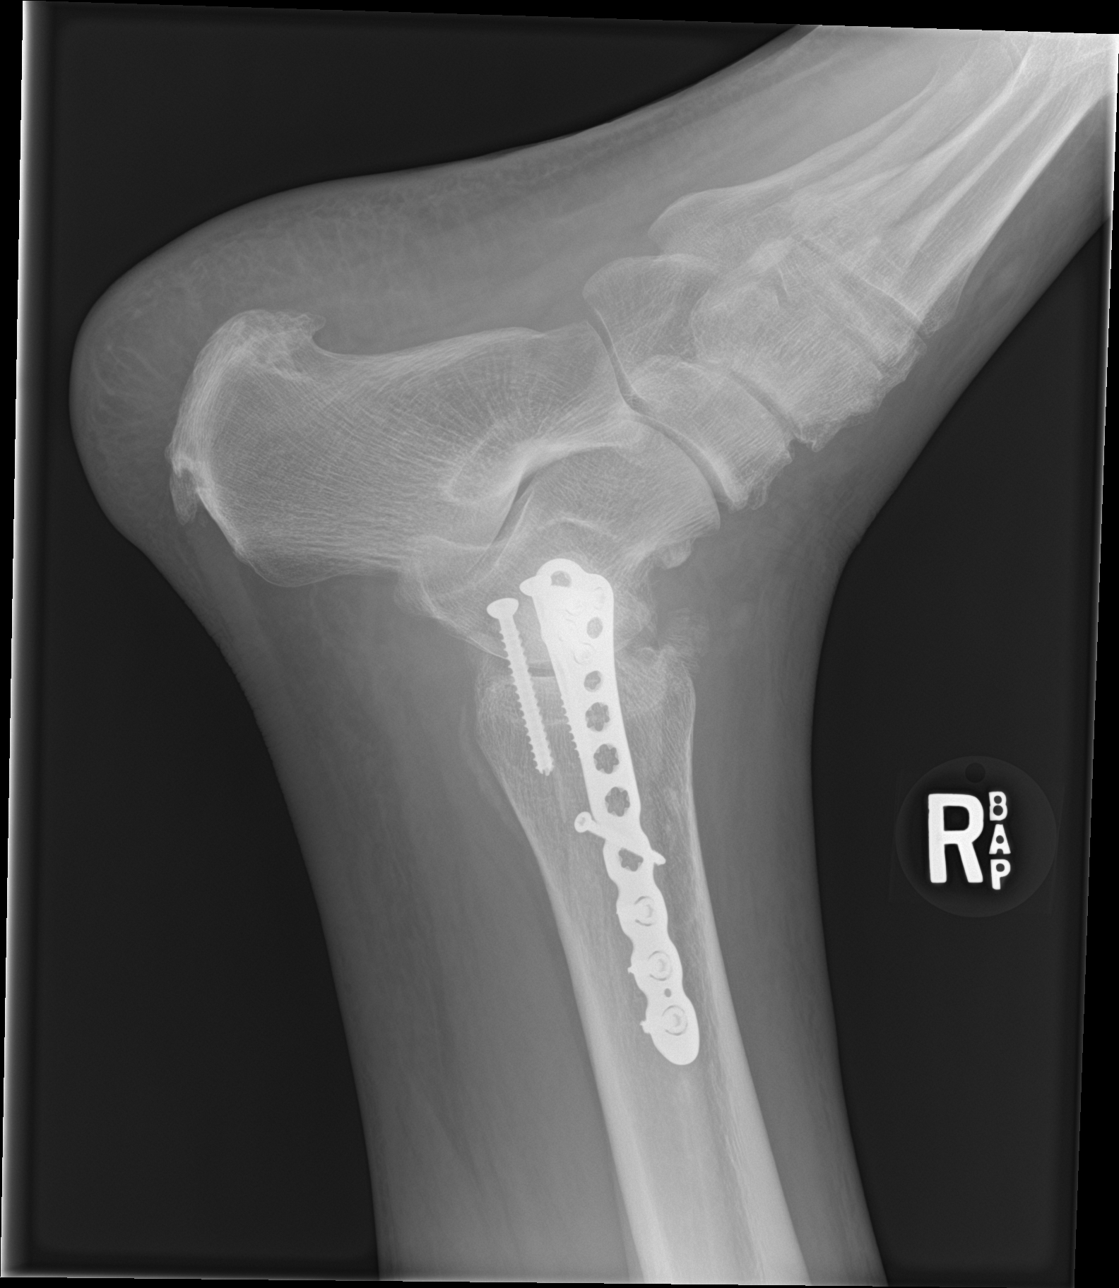

[3 of 3 positions shown; findings below may reference images not displayed]

FINDINGS: There is no acute fracture or dislocation. The patient has had
recent open reduction internal fixation of bimalleolar fractures.
The hardware appears in good position, unchanged. Ankle mortise is
intact and unchanged since the intraoperative images. A bone
fragment lies anterior to the distal fibula as demonstrated on the
prior CT scan.
IMPRESSION: No acute abnormality. No change since the intraoperative images of
07/30/2016.

## 2017-08-19 IMAGING — CR DG CHEST 2V
2 series · 2 of 2 positions shown · non-contrast
Comparison: 08/11/2016

CLINICAL DATA: Chest pain

EXAM:
CHEST  2 VIEW

[chest lat]
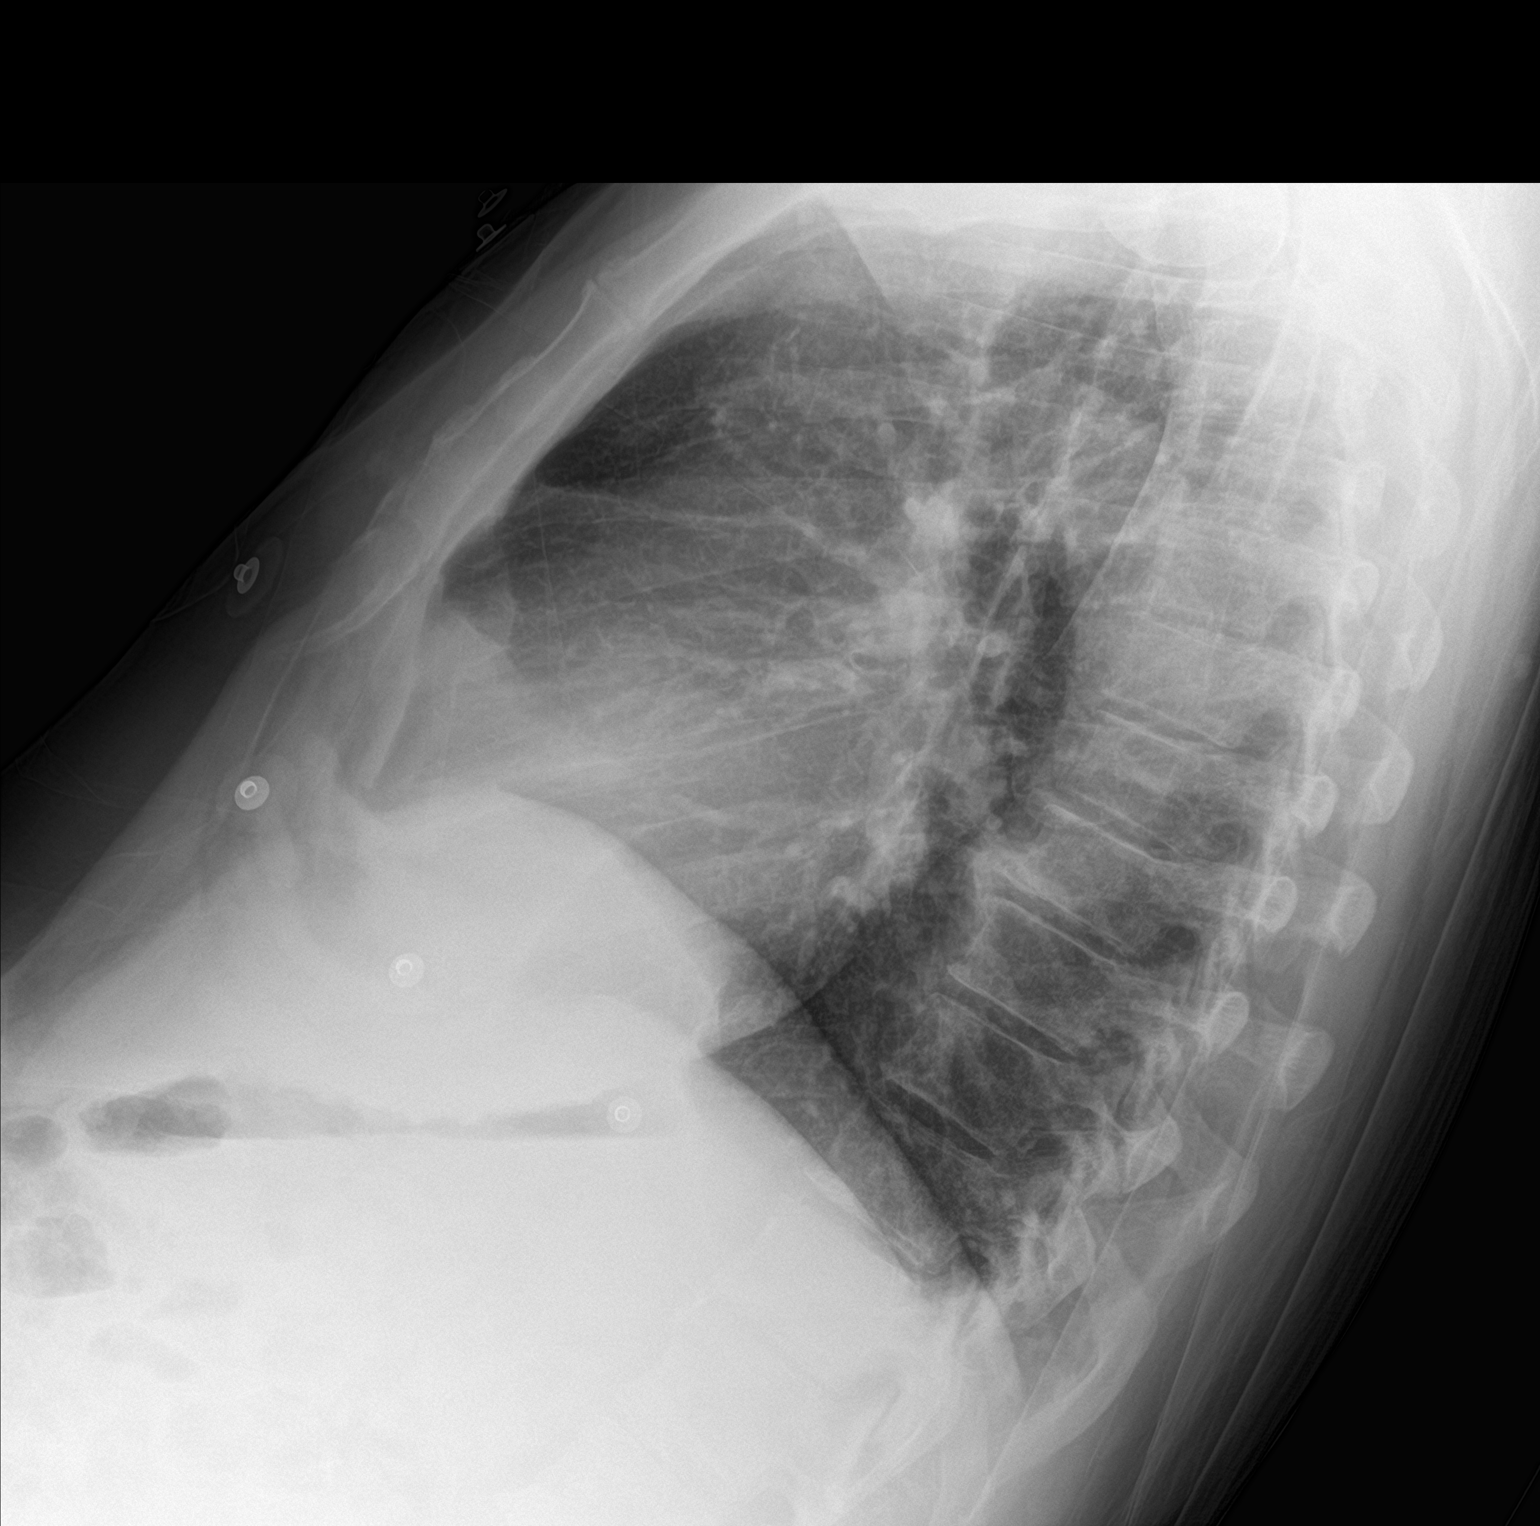

[chest ap]
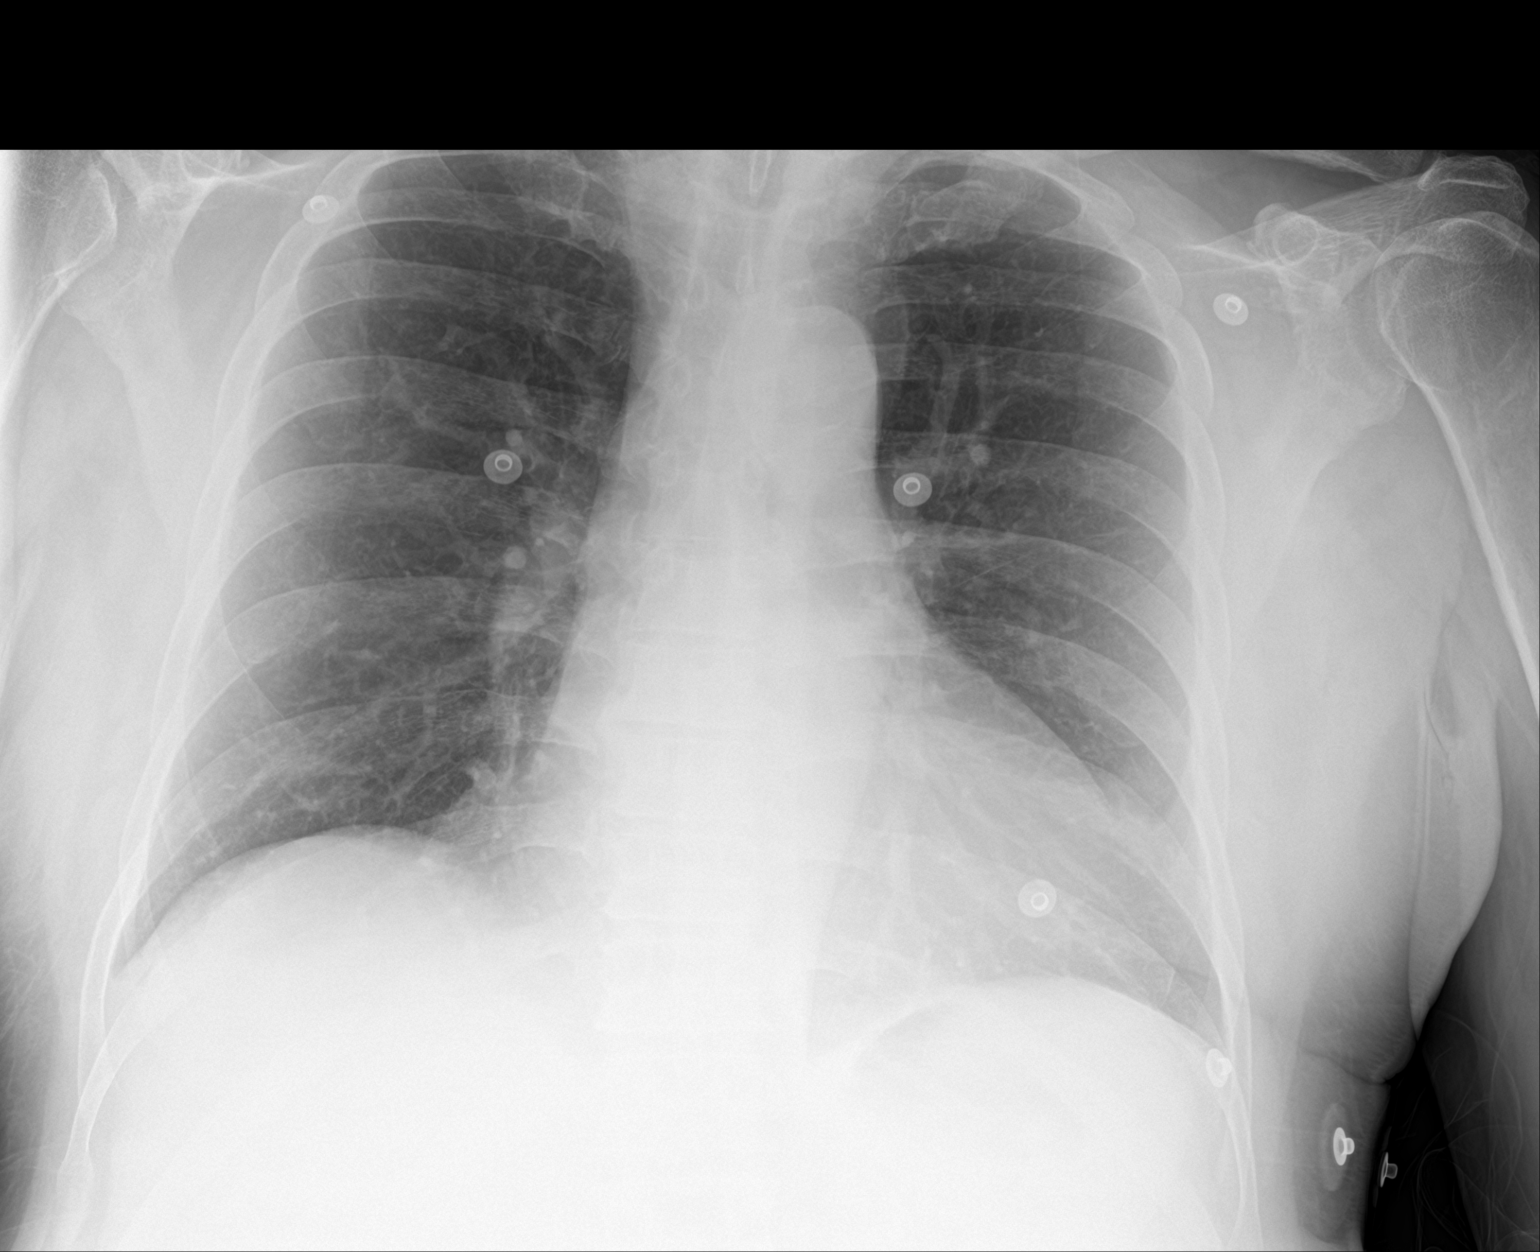

[2 of 2 positions shown; findings below may reference images not displayed]

FINDINGS: The heart size and mediastinal contours are within normal limits.
Both lungs are clear. The visualized skeletal structures are
unremarkable.
IMPRESSION: No active cardiopulmonary disease.

## 2017-09-01 ENCOUNTER — Ambulatory Visit (INDEPENDENT_AMBULATORY_CARE_PROVIDER_SITE_OTHER): Payer: Medicaid Other | Admitting: Physician Assistant

## 2017-09-01 ENCOUNTER — Encounter: Payer: Self-pay | Admitting: Physician Assistant

## 2017-09-01 VITALS — BP 116/76 | HR 70 | Ht 71.0 in | Wt 201.4 lb

## 2017-09-01 DIAGNOSIS — E119 Type 2 diabetes mellitus without complications: Secondary | ICD-10-CM | POA: Diagnosis not present

## 2017-09-01 DIAGNOSIS — N183 Chronic kidney disease, stage 3 unspecified: Secondary | ICD-10-CM

## 2017-09-01 DIAGNOSIS — I428 Other cardiomyopathies: Secondary | ICD-10-CM

## 2017-09-01 DIAGNOSIS — I10 Essential (primary) hypertension: Secondary | ICD-10-CM | POA: Diagnosis not present

## 2017-09-01 DIAGNOSIS — N522 Drug-induced erectile dysfunction: Secondary | ICD-10-CM

## 2017-09-01 DIAGNOSIS — E785 Hyperlipidemia, unspecified: Secondary | ICD-10-CM

## 2017-09-01 DIAGNOSIS — I4892 Unspecified atrial flutter: Secondary | ICD-10-CM | POA: Diagnosis not present

## 2017-09-01 DIAGNOSIS — Z720 Tobacco use: Secondary | ICD-10-CM

## 2017-09-01 MED ORDER — SILDENAFIL CITRATE 20 MG PO TABS: ORAL_TABLET | ORAL | 0 refills | Status: DC
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
## 2017-09-01 NOTE — Patient Instructions (Signed)
May take generic Viagra 20 mg daily if needed  Future refills with Primary Care M.D.     Your physician wants you to follow-up in: 2 to 3 months with Dr.Hilty. You will receive a reminder letter in the mail two months in advance. If you don't receive a letter, please call our office to schedule the follow-up appointment.

## 2017-09-02 ENCOUNTER — Encounter: Payer: Self-pay | Admitting: Physician Assistant

## 2017-09-14 IMAGING — CR DG ANKLE COMPLETE 3+V*R*
3 series · 3 of 3 positions shown · non-contrast
Comparison: 08/16/2016

CLINICAL DATA: Right ankle pain and swelling after ORIF 4 weeks
ago.

EXAM:
RIGHT ANKLE - COMPLETE 3+ VIEW

[ankle ap]
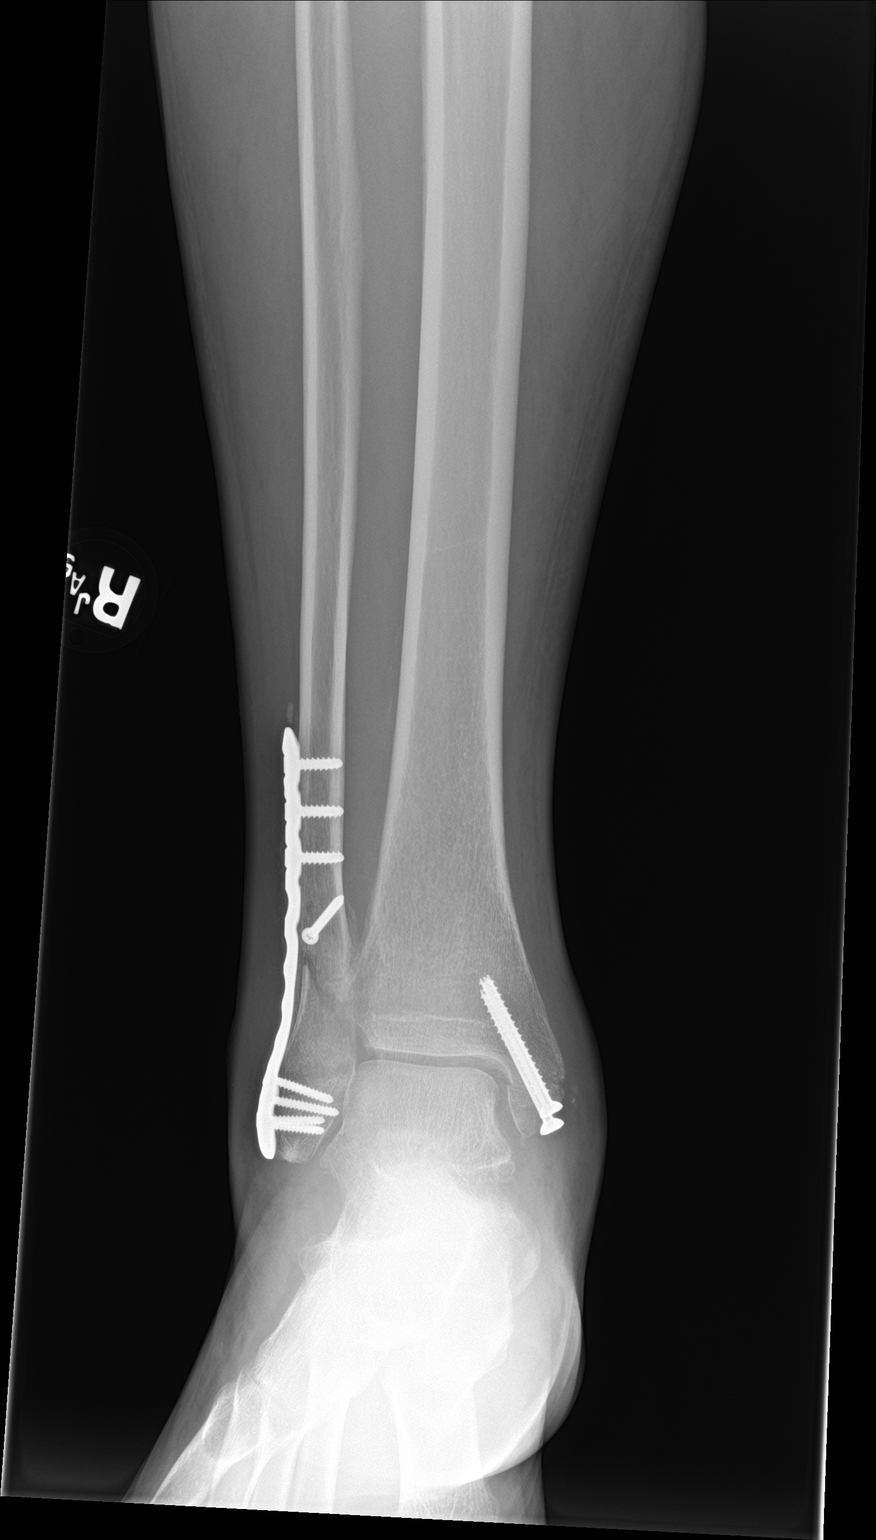

[ankle obl]
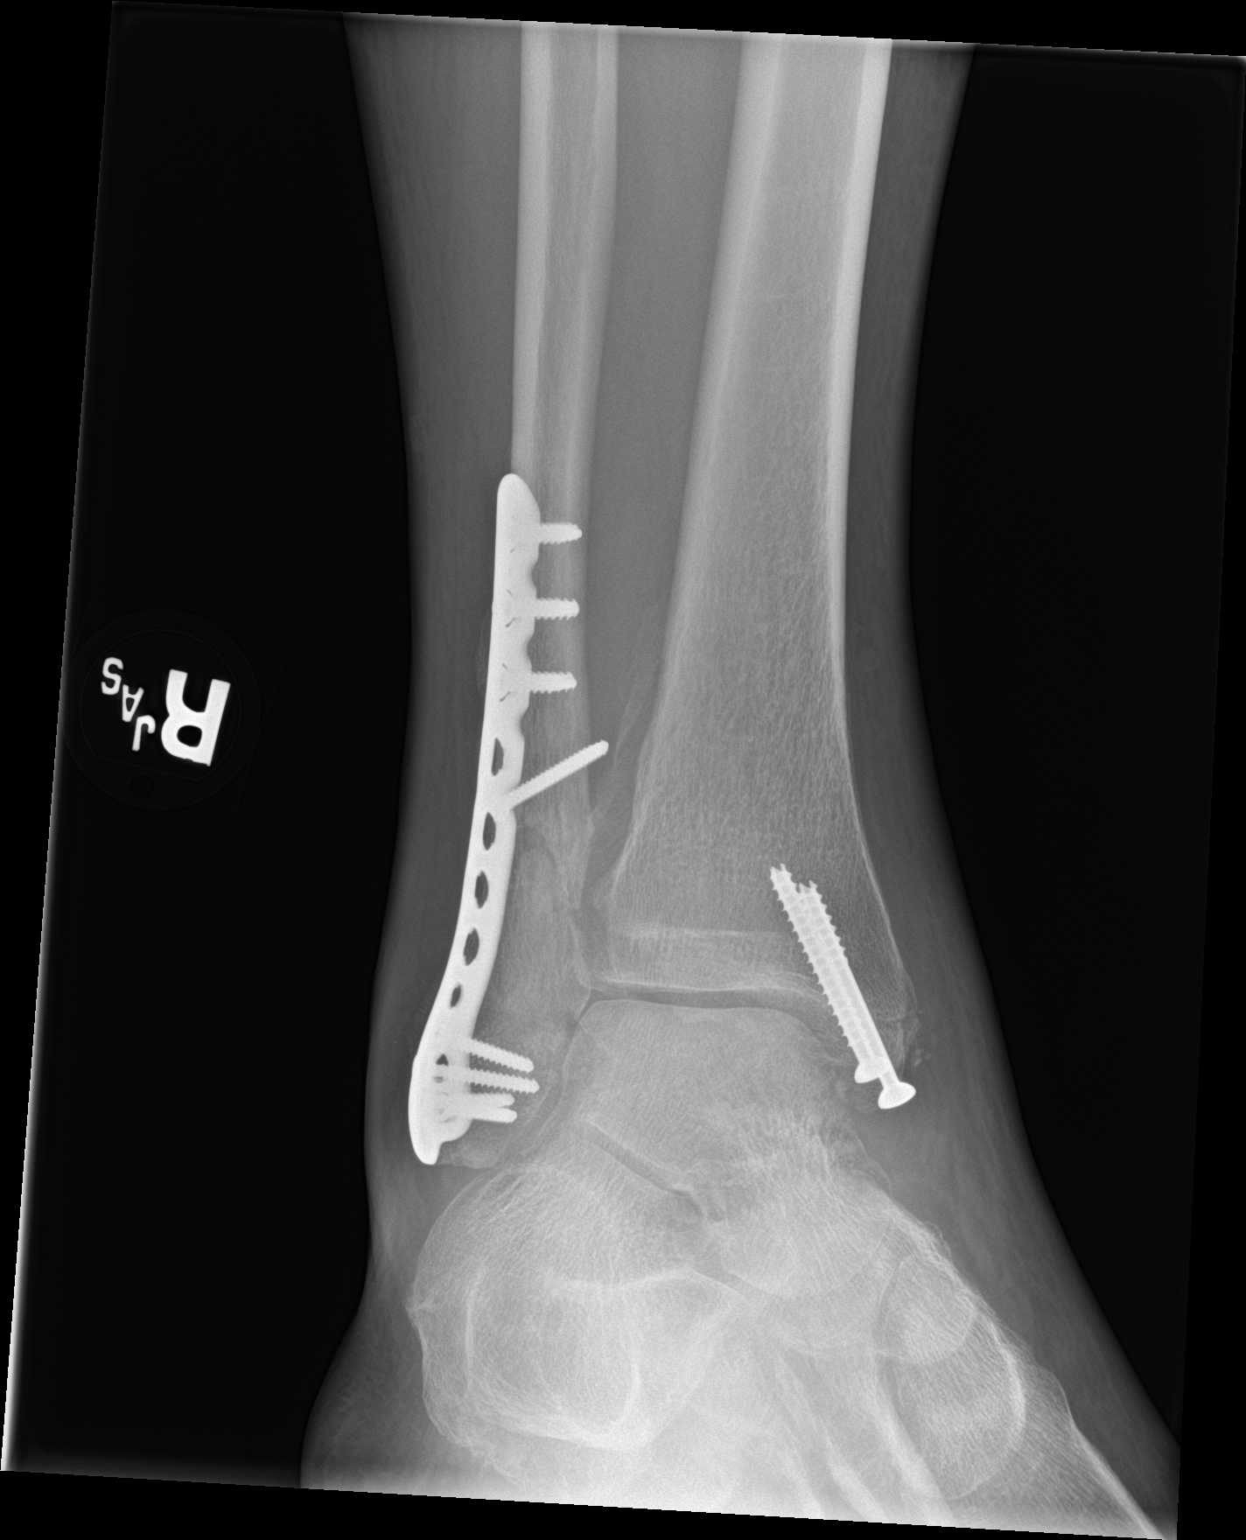

[ankle lat]
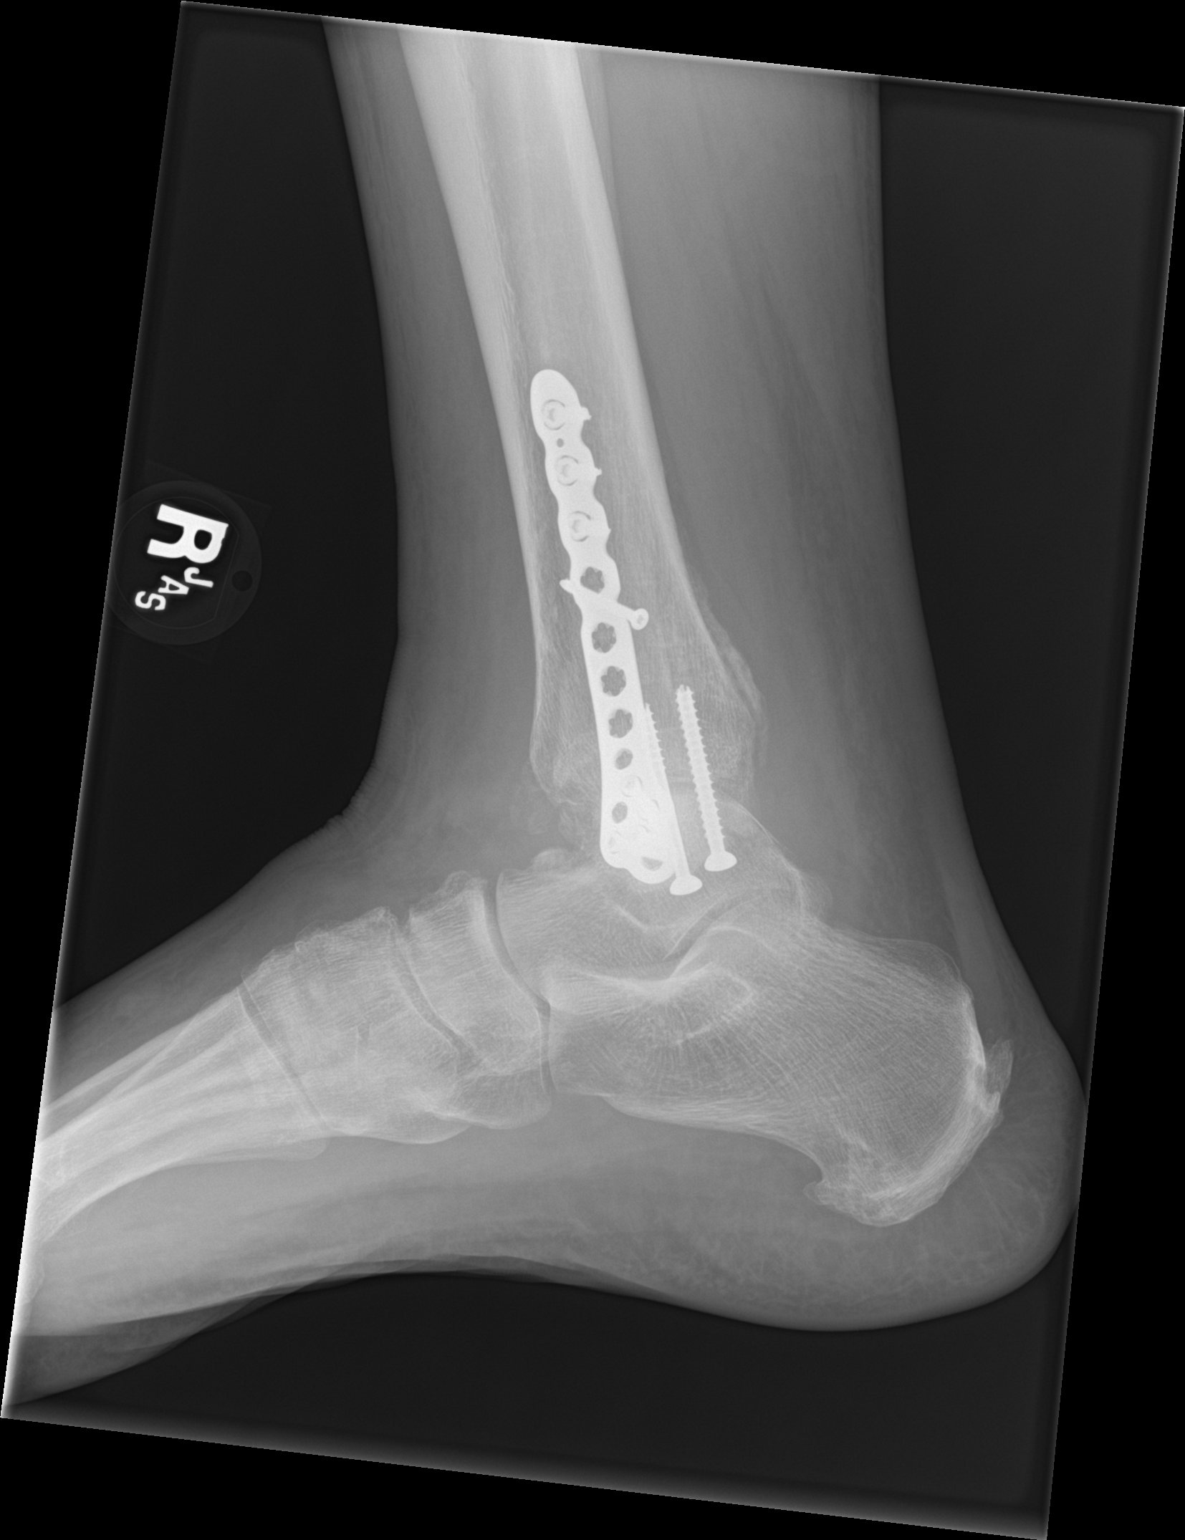

[3 of 3 positions shown; findings below may reference images not displayed]

FINDINGS: Postoperative changes with screw fixation of the medial malleolar
fracture and plate and screw fixation of a distal fibular fracture.
Fracture fragments appear to be in near anatomic alignment and
position without change in position since previous study. Slight
residual cortical step-off at the medial malleolus. Callus formation
is present consistent with interval healing. No evidence of acute
fracture or dislocation. Mild soft tissue swelling is present.
Plantar and Achilles calcaneal spurs. Degenerative changes in the
intertarsal joints.
IMPRESSION: Postoperative internal fixation of healing fractures of the medial
and lateral malleoli of the right ankle. No significant change in
position. Soft tissue swelling.

## 2017-09-14 IMAGING — CR DG CHEST 2V
2 series · 2 of 2 positions shown · non-contrast
Comparison: 08/16/2016

CLINICAL DATA: Chest pain and sweats for a few days. Right lower
leg pain and swelling. ORIF 4 weeks ago. History of hypertension,
diabetes, smoker.

EXAM:
CHEST  2 VIEW

[chest lat]
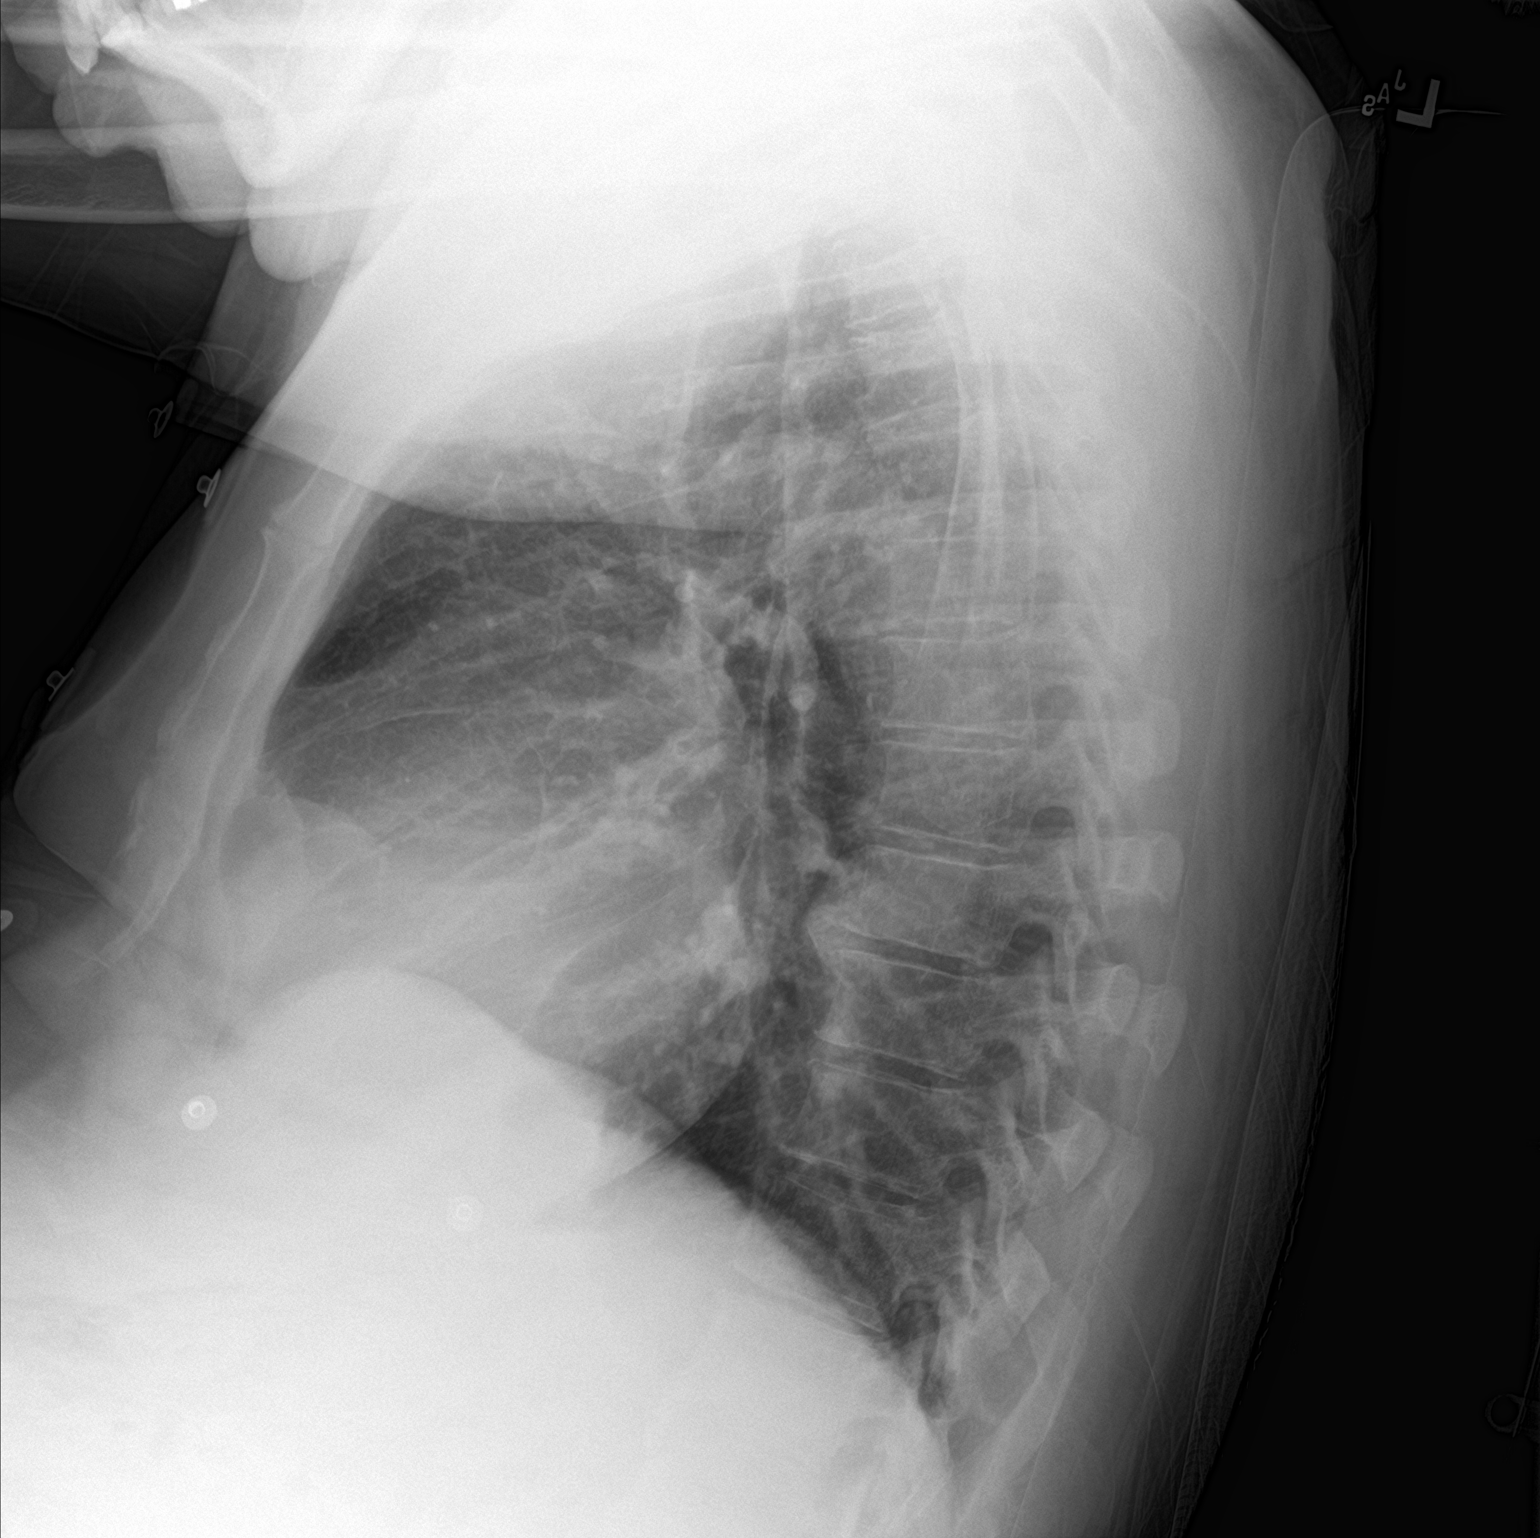

[chest ap]
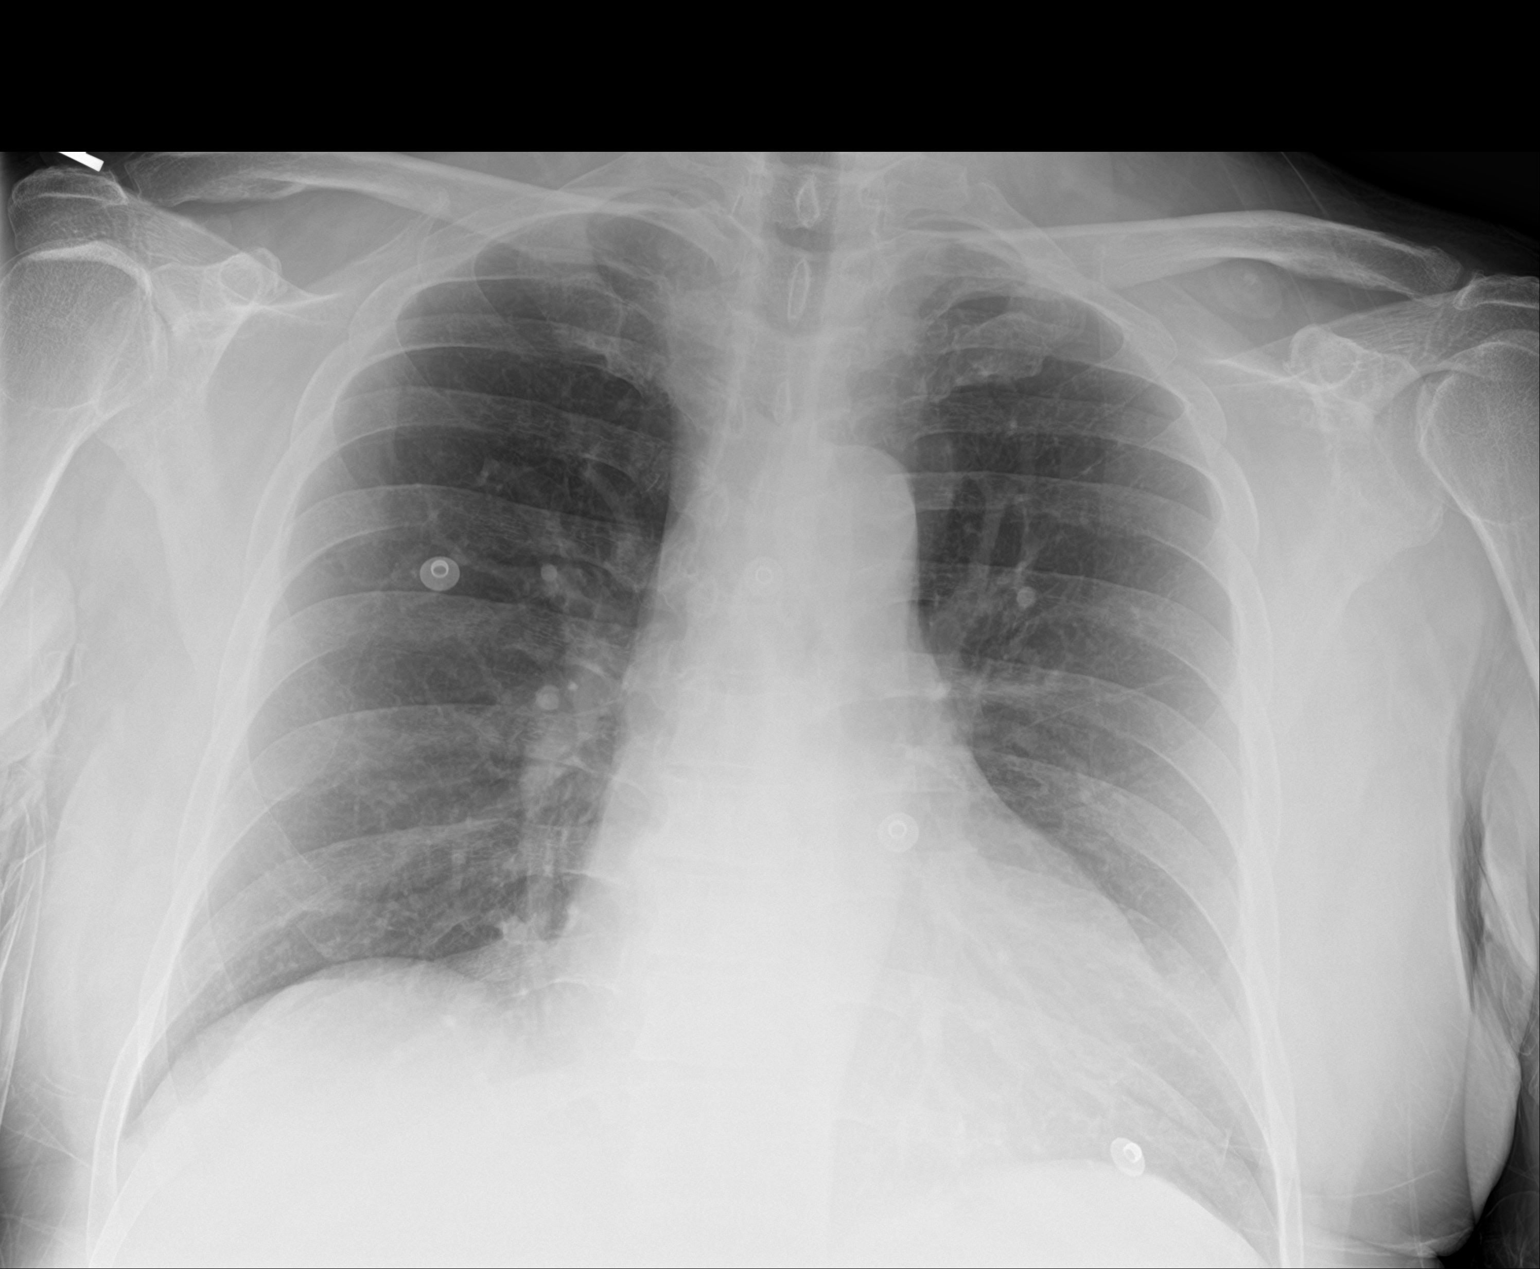

[2 of 2 positions shown; findings below may reference images not displayed]

FINDINGS: Normal heart size and pulmonary vascularity. No focal airspace
disease or consolidation in the lungs. No blunting of costophrenic
angles. No pneumothorax. Mediastinal contours appear intact.
Tortuous aorta. Degenerative changes in the spine.
IMPRESSION: No active cardiopulmonary disease.

## 2017-09-18 ENCOUNTER — Other Ambulatory Visit: Payer: Self-pay | Admitting: Family Medicine

## 2017-09-18 DIAGNOSIS — K219 Gastro-esophageal reflux disease without esophagitis: Secondary | ICD-10-CM

## 2017-09-18 DIAGNOSIS — I10 Essential (primary) hypertension: Secondary | ICD-10-CM

## 2017-09-18 DIAGNOSIS — E1140 Type 2 diabetes mellitus with diabetic neuropathy, unspecified: Secondary | ICD-10-CM

## 2017-09-18 NOTE — Telephone Encounter (Signed)
Patient called requesting medication refill on  amitriptyline (ELAVIL) 75 MG tablet and gabapentin (NEURONTIN) 300 MG capsule  Please f/up

## 2017-09-21 ENCOUNTER — Telehealth: Payer: Self-pay | Admitting: Family Medicine

## 2017-09-21 IMAGING — DX DG CHEST 2V
2 series · 2 of 2 positions shown · non-contrast
Comparison: September 11, 2016

CLINICAL DATA: Chest pain

EXAM:
CHEST  2 VIEW

[x chest ap]
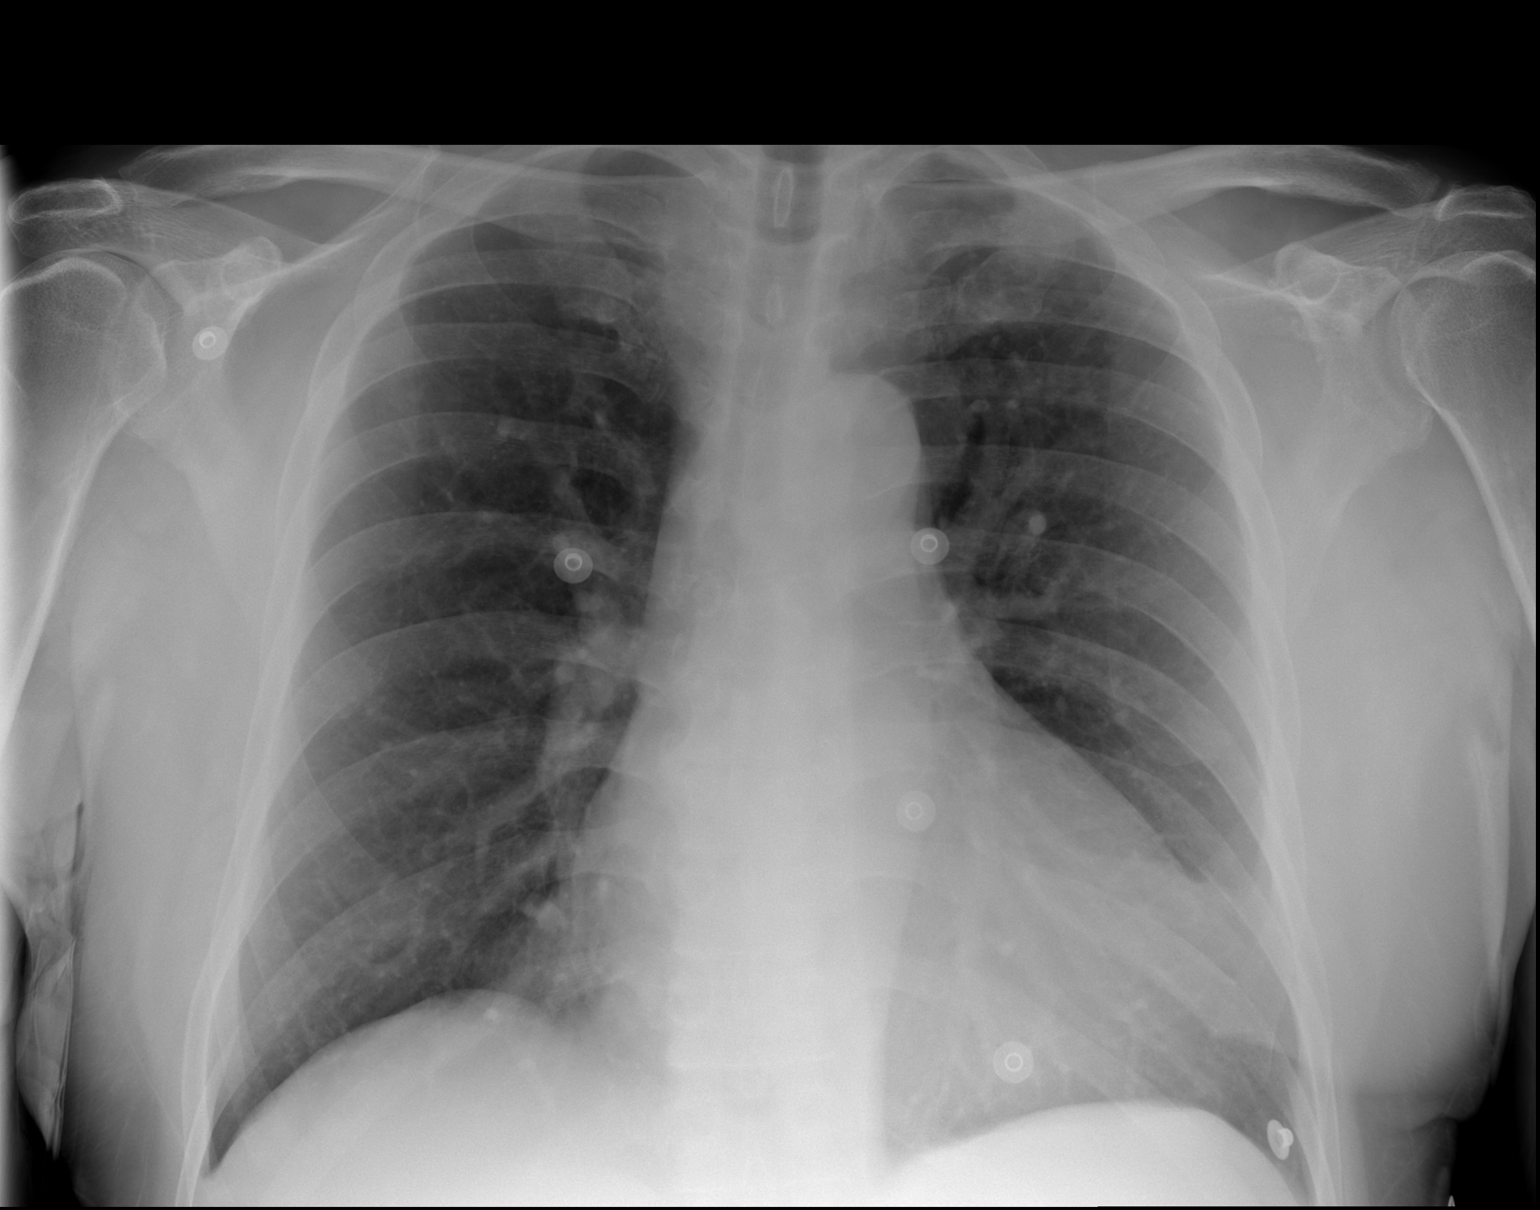

[w chest lat]
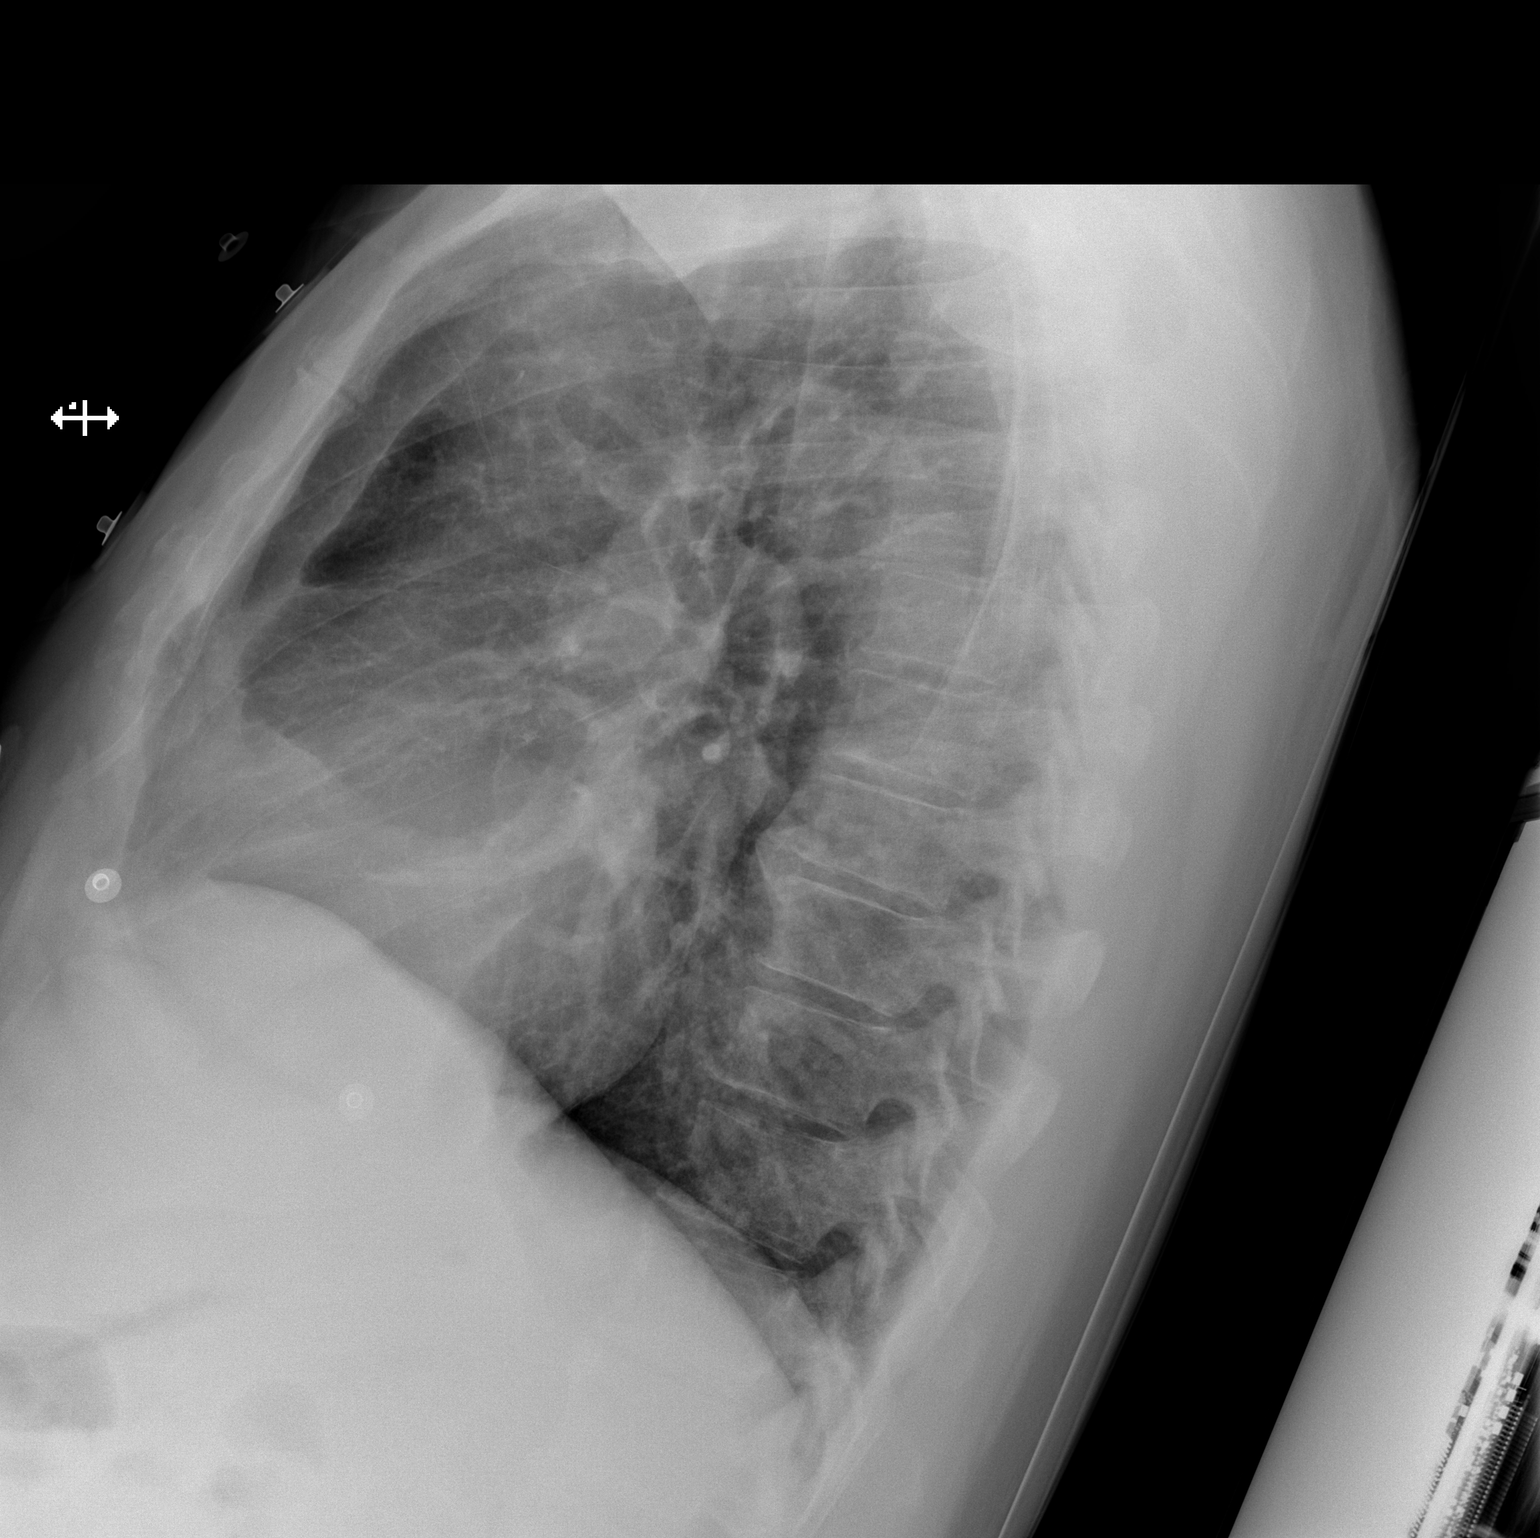

[2 of 2 positions shown; findings below may reference images not displayed]

FINDINGS: There is epicardial fat prominence at the left base. There is no
edema or consolidation. Heart is upper normal in size with pulmonary
vascularity within normal limits. No adenopathy. No bone lesions.
There is degenerative change in mid thoracic region.
IMPRESSION: No edema or consolidation.  Stable cardiac silhouette.

## 2017-09-21 NOTE — Telephone Encounter (Signed)
Pt called need a refill for gabapentin (NEURONTIN) 300 MG capsule  and other medication, he need all his med refilplease follow up

## 2017-09-21 NOTE — Telephone Encounter (Signed)
Patient no showed last appt, no appt scheduled, need PCP's approval

## 2017-09-22 ENCOUNTER — Encounter (HOSPITAL_COMMUNITY): Payer: Self-pay | Admitting: Family Medicine

## 2017-09-22 ENCOUNTER — Ambulatory Visit (HOSPITAL_COMMUNITY)
Admission: EM | Admit: 2017-09-22 | Discharge: 2017-09-22 | Disposition: A | Discharge status: Home / Self Care | Payer: Medicaid Other | Attending: Family Medicine | Admitting: Family Medicine

## 2017-09-22 DIAGNOSIS — R21 Rash and other nonspecific skin eruption: Secondary | ICD-10-CM

## 2017-09-22 DIAGNOSIS — M25571 Pain in right ankle and joints of right foot: Secondary | ICD-10-CM | POA: Diagnosis not present

## 2017-09-22 DIAGNOSIS — E1140 Type 2 diabetes mellitus with diabetic neuropathy, unspecified: Secondary | ICD-10-CM

## 2017-09-22 DIAGNOSIS — I10 Essential (primary) hypertension: Secondary | ICD-10-CM

## 2017-09-22 DIAGNOSIS — K219 Gastro-esophageal reflux disease without esophagitis: Secondary | ICD-10-CM

## 2017-09-22 MED ORDER — ATORVASTATIN CALCIUM 40 MG PO TABS: 40.0000 mg | ORAL_TABLET | Freq: Every day | ORAL | 10 refills | Status: DC

## 2017-09-22 MED ORDER — PREDNISONE 20 MG PO TABS: ORAL_TABLET | ORAL | 0 refills | Status: DC

## 2017-09-22 MED ORDER — CARVEDILOL 6.25 MG PO TABS: 12.5000 mg | ORAL_TABLET | Freq: Two times a day (BID) | ORAL | 3 refills | Status: DC

## 2017-09-22 MED ORDER — CLONIDINE HCL 0.1 MG PO TABS: 0.1000 mg | ORAL_TABLET | Freq: Two times a day (BID) | ORAL | 3 refills | Status: DC

## 2017-09-22 MED ORDER — AMITRIPTYLINE HCL 75 MG PO TABS: 75.0000 mg | ORAL_TABLET | Freq: Every day | ORAL | 11 refills | Status: DC

## 2017-09-22 MED ORDER — HYDROCORTISONE 2.5 % EX CREA: TOPICAL_CREAM | Freq: Two times a day (BID) | CUTANEOUS | 5 refills | Status: DC

## 2017-09-22 MED ORDER — GLUCOSE BLOOD VI STRP: ORAL_STRIP | 12 refills | Status: DC

## 2017-09-22 MED ORDER — SILDENAFIL CITRATE 20 MG PO TABS: ORAL_TABLET | ORAL | 3 refills | Status: DC

## 2017-09-22 MED ORDER — DICLOFENAC SODIUM 1 % TD GEL: 4.0000 g | Freq: Four times a day (QID) | TRANSDERMAL | 5 refills | Status: DC

## 2017-09-22 MED ORDER — INSULIN GLARGINE 100 UNIT/ML SOLOSTAR PEN: 50.0000 [IU] | PEN_INJECTOR | Freq: Every day | SUBCUTANEOUS | 10 refills | Status: DC

## 2017-09-22 MED ORDER — OMEPRAZOLE 20 MG PO CPDR: 20.0000 mg | DELAYED_RELEASE_CAPSULE | Freq: Every day | ORAL | 3 refills | Status: DC

## 2017-09-22 MED ORDER — GABAPENTIN 300 MG PO CAPS: 300.0000 mg | ORAL_CAPSULE | Freq: Two times a day (BID) | ORAL | 6 refills | Status: DC

## 2017-09-22 NOTE — ED Triage Notes (Signed)
Patient needs refill for all medications  And patient reports right ankle injury 18 months ago and has had continued problems, reports he has metal plates and screws in left ankle. Walks with can.
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
## 2017-09-22 NOTE — Telephone Encounter (Signed)
He needs an office visit.  

## 2017-09-30 ENCOUNTER — Ambulatory Visit: Payer: Self-pay | Admitting: Family Medicine

## 2017-10-02 ENCOUNTER — Emergency Department (HOSPITAL_COMMUNITY): Payer: Medicaid Other

## 2017-10-02 ENCOUNTER — Encounter (HOSPITAL_COMMUNITY): Payer: Self-pay

## 2017-10-02 ENCOUNTER — Encounter (HOSPITAL_COMMUNITY): Payer: Self-pay | Admitting: Family Medicine

## 2017-10-02 ENCOUNTER — Emergency Department (HOSPITAL_COMMUNITY)
Admission: EM | Admit: 2017-10-02 | Discharge: 2017-10-02 | Disposition: A | Discharge status: Home / Self Care | Payer: Medicaid Other | Attending: Emergency Medicine | Admitting: Emergency Medicine

## 2017-10-02 DIAGNOSIS — Y906 Blood alcohol level of 120-199 mg/100 ml: Secondary | ICD-10-CM | POA: Insufficient documentation

## 2017-10-02 DIAGNOSIS — Y999 Unspecified external cause status: Secondary | ICD-10-CM | POA: Insufficient documentation

## 2017-10-02 DIAGNOSIS — R4182 Altered mental status, unspecified: Secondary | ICD-10-CM | POA: Diagnosis not present

## 2017-10-02 DIAGNOSIS — Y92009 Unspecified place in unspecified non-institutional (private) residence as the place of occurrence of the external cause: Secondary | ICD-10-CM | POA: Diagnosis not present

## 2017-10-02 DIAGNOSIS — S0081XA Abrasion of other part of head, initial encounter: Secondary | ICD-10-CM | POA: Insufficient documentation

## 2017-10-02 DIAGNOSIS — S0083XA Contusion of other part of head, initial encounter: Secondary | ICD-10-CM | POA: Diagnosis not present

## 2017-10-02 DIAGNOSIS — S0990XA Unspecified injury of head, initial encounter: Secondary | ICD-10-CM | POA: Diagnosis present

## 2017-10-02 DIAGNOSIS — E876 Hypokalemia: Secondary | ICD-10-CM | POA: Diagnosis not present

## 2017-10-02 DIAGNOSIS — I10 Essential (primary) hypertension: Secondary | ICD-10-CM | POA: Diagnosis not present

## 2017-10-02 DIAGNOSIS — W19XXXA Unspecified fall, initial encounter: Secondary | ICD-10-CM | POA: Diagnosis not present

## 2017-10-02 DIAGNOSIS — Z23 Encounter for immunization: Secondary | ICD-10-CM | POA: Insufficient documentation

## 2017-10-02 DIAGNOSIS — Y9389 Activity, other specified: Secondary | ICD-10-CM | POA: Diagnosis not present

## 2017-10-02 LAB — CBC WITH DIFFERENTIAL/PLATELET
Basophils Absolute: 0 10*3/uL (ref 0.0–0.1)
Basophils Relative: 0 %
Eosinophils Absolute: 0.1 10*3/uL (ref 0.0–0.7)
Eosinophils Relative: 1 %
HCT: 37.9 % — ABNORMAL LOW (ref 39.0–52.0)
Hemoglobin: 13.2 g/dL (ref 13.0–17.0)
Lymphocytes Relative: 44 %
Lymphs Abs: 3.7 10*3/uL (ref 0.7–4.0)
MCH: 32.8 pg (ref 26.0–34.0)
MCHC: 34.8 g/dL (ref 30.0–36.0)
MCV: 94 fL (ref 78.0–100.0)
Monocytes Absolute: 0.9 10*3/uL (ref 0.1–1.0)
Monocytes Relative: 10 %
Neutro Abs: 3.8 10*3/uL (ref 1.7–7.7)
Neutrophils Relative %: 45 %
Platelets: 143 10*3/uL — ABNORMAL LOW (ref 150–400)
RBC: 4.03 MIL/uL — ABNORMAL LOW (ref 4.22–5.81)
RDW: 12 % (ref 11.5–15.5)
WBC: 8.4 10*3/uL (ref 4.0–10.5)

## 2017-10-02 LAB — RAPID URINE DRUG SCREEN, HOSP PERFORMED
Amphetamines: NOT DETECTED
Barbiturates: NOT DETECTED
Benzodiazepines: NOT DETECTED
Cocaine: NOT DETECTED
Opiates: NOT DETECTED
Tetrahydrocannabinol: NOT DETECTED

## 2017-10-02 LAB — URINALYSIS, ROUTINE W REFLEX MICROSCOPIC
Bacteria, UA: NONE SEEN
Bilirubin Urine: NEGATIVE
Glucose, UA: 50 mg/dL — AB
Hgb urine dipstick: NEGATIVE
Ketones, ur: NEGATIVE mg/dL
Leukocytes, UA: NEGATIVE
Nitrite: NEGATIVE
Protein, ur: 100 mg/dL — AB
Specific Gravity, Urine: 1.004 — ABNORMAL LOW (ref 1.005–1.030)
Squamous Epithelial / LPF: NONE SEEN
WBC, UA: NONE SEEN WBC/hpf (ref 0–5)
pH: 5 (ref 5.0–8.0)

## 2017-10-02 LAB — COMPREHENSIVE METABOLIC PANEL
ALT: 34 U/L (ref 17–63)
AST: 34 U/L (ref 15–41)
Albumin: 3.1 g/dL — ABNORMAL LOW (ref 3.5–5.0)
Alkaline Phosphatase: 62 U/L (ref 38–126)
Anion gap: 10 (ref 5–15)
BUN: 18 mg/dL (ref 6–20)
CO2: 25 mmol/L (ref 22–32)
Calcium: 8.5 mg/dL — ABNORMAL LOW (ref 8.9–10.3)
Chloride: 97 mmol/L — ABNORMAL LOW (ref 101–111)
Creatinine, Ser: 1.71 mg/dL — ABNORMAL HIGH (ref 0.61–1.24)
GFR calc Af Amer: 49 mL/min — ABNORMAL LOW (ref >60)
GFR calc non Af Amer: 43 mL/min — ABNORMAL LOW (ref >60)
Glucose, Bld: 131 mg/dL — ABNORMAL HIGH (ref 65–99)
Potassium: 2.7 mmol/L — CL (ref 3.5–5.1)
Sodium: 132 mmol/L — ABNORMAL LOW (ref 135–145)
Total Bilirubin: 0.8 mg/dL (ref 0.3–1.2)
Total Protein: 6.9 g/dL (ref 6.5–8.1)

## 2017-10-02 LAB — ACETAMINOPHEN LEVEL: Acetaminophen (Tylenol), Serum: <10 ug/mL — ABNORMAL LOW (ref 10–30)

## 2017-10-02 LAB — ETHANOL: Alcohol, Ethyl (B): 179 mg/dL — ABNORMAL HIGH (ref <10)

## 2017-10-02 LAB — SALICYLATE LEVEL: Salicylate Lvl: <7 mg/dL (ref 2.8–30.0)

## 2017-10-02 LAB — CK: Total CK: 61 U/L (ref 49–397)

## 2017-10-02 LAB — CBG MONITORING, ED: Glucose-Capillary: 168 mg/dL — ABNORMAL HIGH (ref 65–99)

## 2017-10-02 MED ORDER — POTASSIUM CHLORIDE 10 MEQ/100ML IV SOLN
10.0000 meq | INTRAVENOUS | Status: AC
  Administered 2017-10-02: 10 meq via INTRAVENOUS
  Filled 2017-10-02 (×2): qty 100

## 2017-10-02 MED ORDER — SODIUM CHLORIDE 0.9 % IV BOLUS (SEPSIS)
1000.0000 mL | Freq: Once | INTRAVENOUS | Status: AC
  Administered 2017-10-02: 1000 mL via INTRAVENOUS

## 2017-10-02 MED ORDER — POTASSIUM CHLORIDE CRYS ER 20 MEQ PO TBCR
40.0000 meq | EXTENDED_RELEASE_TABLET | Freq: Once | ORAL | Status: AC
  Administered 2017-10-02: 40 meq via ORAL
  Filled 2017-10-02: qty 2

## 2017-10-02 MED ORDER — TETANUS-DIPHTH-ACELL PERTUSSIS 5-2.5-18.5 LF-MCG/0.5 IM SUSP
0.5000 mL | Freq: Once | INTRAMUSCULAR | Status: AC
  Administered 2017-10-02: 0.5 mL via INTRAMUSCULAR
  Filled 2017-10-02: qty 0.5

## 2017-10-02 NOTE — ED Notes (Signed)
Patient transported to X-ray 

## 2017-10-02 NOTE — ED Notes (Signed)
Pt poor historian. Pt alert to voice but lethargic, pt exhibiting mild confusion. Pt unable to answer this RN's questions regarding medical hx, only able to verify NKDA and HTN. Will attempt to assess pt past medical hx and current meds when pt more alert.

## 2017-10-02 NOTE — ED Notes (Signed)
Pt placed on bair hugger for temperature of 94 degrees.

## 2017-10-02 NOTE — ED Notes (Signed)
Patient transported to CT 

## 2017-10-02 NOTE — ED Notes (Signed)
Dr. Zenia Resides aware of pt potasssium.

## 2017-10-02 NOTE — ED Notes (Signed)
Pt ambulated several times around the room.  The pt tolerated well and did not need assistance.  He endorsed slight dizziness, but appeared stable while ambulating. Informed Claiborne Billings, RN.

## 2017-10-02 NOTE — ED Provider Notes (Signed)
Patient sign out received from [facility], PA-C  The patient came in intoxicated and had a fall. He is alert and oriented x 3. He is pending a UA, UDS, recheck or temperature, saline bolus, potassium replacement, and CK lab value.  8:23 am UDS is negative CK is normal Waiting for recheck of rectal temperature, he is also getting his two runs of IV potassium and awaiting his oral potassium.  10:49 am Pt has ambulated is awake, alert, normal temperature. He is unsure of how he got to the ER and doesn't remember anything about last night. He is however, awake, alert and oriented x 3. He is now safe for discharge.    Blood pressure (!) 161/95, pulse 80, temperature 97.7 F (36.5 C), temperature source Oral, resp. rate 11, height 6\' 1"  (1.854 m), weight 108.9 kg (240 lb), SpO2 96 %.  Conner Muegge has been evaluated today in the emergency department. The appropriate screening and testing was been performed and I believe the patient to be medically stable for discharge.   Return signs and symptoms have been discussed with the patient and/or caregivers and they have voiced their understanding. The patient has agreed to follow-up with their primary care provider or the referred specialist.           Delos Haring, PA-C 10/02/17 [place]    Lacretia Leigh, MD 10/04/17 [phone]
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
## 2017-10-07 ENCOUNTER — Telehealth: Payer: Self-pay

## 2017-10-07 ENCOUNTER — Ambulatory Visit: Payer: Medicaid Other | Attending: Family Medicine | Admitting: Family Medicine

## 2017-10-07 ENCOUNTER — Encounter: Payer: Self-pay | Admitting: Family Medicine

## 2017-10-07 VITALS — BP 171/99 | HR 77 | Temp 98.6°F | Ht 71.0 in | Wt 199.4 lb

## 2017-10-07 DIAGNOSIS — E1122 Type 2 diabetes mellitus with diabetic chronic kidney disease: Secondary | ICD-10-CM | POA: Insufficient documentation

## 2017-10-07 DIAGNOSIS — Z79899 Other long term (current) drug therapy: Secondary | ICD-10-CM | POA: Insufficient documentation

## 2017-10-07 DIAGNOSIS — E1165 Type 2 diabetes mellitus with hyperglycemia: Secondary | ICD-10-CM | POA: Diagnosis not present

## 2017-10-07 DIAGNOSIS — N183 Chronic kidney disease, stage 3 (moderate): Secondary | ICD-10-CM | POA: Diagnosis not present

## 2017-10-07 DIAGNOSIS — I5032 Chronic diastolic (congestive) heart failure: Secondary | ICD-10-CM | POA: Insufficient documentation

## 2017-10-07 DIAGNOSIS — I10 Essential (primary) hypertension: Secondary | ICD-10-CM | POA: Diagnosis not present

## 2017-10-07 DIAGNOSIS — E1121 Type 2 diabetes mellitus with diabetic nephropathy: Secondary | ICD-10-CM | POA: Diagnosis not present

## 2017-10-07 DIAGNOSIS — Z794 Long term (current) use of insulin: Secondary | ICD-10-CM

## 2017-10-07 DIAGNOSIS — F329 Major depressive disorder, single episode, unspecified: Secondary | ICD-10-CM | POA: Insufficient documentation

## 2017-10-07 DIAGNOSIS — K219 Gastro-esophageal reflux disease without esophagitis: Secondary | ICD-10-CM | POA: Insufficient documentation

## 2017-10-07 DIAGNOSIS — Z7901 Long term (current) use of anticoagulants: Secondary | ICD-10-CM | POA: Diagnosis not present

## 2017-10-07 DIAGNOSIS — F1410 Cocaine abuse, uncomplicated: Secondary | ICD-10-CM | POA: Insufficient documentation

## 2017-10-07 DIAGNOSIS — E876 Hypokalemia: Secondary | ICD-10-CM

## 2017-10-07 DIAGNOSIS — I4891 Unspecified atrial fibrillation: Secondary | ICD-10-CM | POA: Insufficient documentation

## 2017-10-07 DIAGNOSIS — E1140 Type 2 diabetes mellitus with diabetic neuropathy, unspecified: Secondary | ICD-10-CM | POA: Diagnosis not present

## 2017-10-07 DIAGNOSIS — IMO0002 Reserved for concepts with insufficient information to code with codable children: Secondary | ICD-10-CM

## 2017-10-07 DIAGNOSIS — B1920 Unspecified viral hepatitis C without hepatic coma: Secondary | ICD-10-CM | POA: Diagnosis not present

## 2017-10-07 DIAGNOSIS — I4892 Unspecified atrial flutter: Secondary | ICD-10-CM

## 2017-10-07 DIAGNOSIS — E7800 Pure hypercholesterolemia, unspecified: Secondary | ICD-10-CM | POA: Diagnosis not present

## 2017-10-07 DIAGNOSIS — I130 Hypertensive heart and chronic kidney disease with heart failure and stage 1 through stage 4 chronic kidney disease, or unspecified chronic kidney disease: Secondary | ICD-10-CM | POA: Diagnosis not present

## 2017-10-07 LAB — POCT GLYCOSYLATED HEMOGLOBIN (HGB A1C): Hemoglobin A1C: 9.1

## 2017-10-07 LAB — GLUCOSE, POCT (MANUAL RESULT ENTRY): POC Glucose: 219 mg/dl — AB (ref 70–99)

## 2017-10-07 MED ORDER — GABAPENTIN 300 MG PO CAPS: 600.0000 mg | ORAL_CAPSULE | Freq: Two times a day (BID) | ORAL | 5 refills | Status: DC

## 2017-10-07 MED ORDER — APIXABAN 5 MG PO TABS: 5.0000 mg | ORAL_TABLET | Freq: Two times a day (BID) | ORAL | 5 refills | Status: DC

## 2017-10-07 MED ORDER — AMLODIPINE BESYLATE 10 MG PO TABS: 10.0000 mg | ORAL_TABLET | Freq: Every day | ORAL | 5 refills | Status: DC

## 2017-10-07 MED ORDER — INSULIN GLARGINE 100 UNIT/ML SOLOSTAR PEN: 60.0000 [IU] | PEN_INJECTOR | Freq: Every day | SUBCUTANEOUS | 5 refills | Status: DC

## 2017-10-07 NOTE — Patient Instructions (Signed)
Diabetes Mellitus and Food It is important for you to manage your blood sugar (glucose) level. Your blood glucose level can be greatly affected by what you eat. Eating healthier foods in the appropriate amounts throughout the day at about the same time each day will help you control your blood glucose level. It can also help slow or prevent worsening of your diabetes mellitus. Healthy eating may even help you improve the level of your blood pressure and reach or maintain a healthy weight. General recommendations for healthful eating and cooking habits include:  Eating meals and snacks regularly. Avoid going long periods of time without eating to lose weight.  Eating a diet that consists mainly of plant-based foods, such as fruits, vegetables, nuts, legumes, and whole grains.  Using low-heat cooking methods, such as baking, instead of high-heat cooking methods, such as deep frying.  Work with your dietitian to make sure you understand how to use the Nutrition Facts information on food labels. How can food affect me? Carbohydrates Carbohydrates affect your blood glucose level more than any other type of food. Your dietitian will help you determine how many carbohydrates to eat at each meal and teach you how to count carbohydrates. Counting carbohydrates is important to keep your blood glucose at a healthy level, especially if you are using insulin or taking certain medicines for diabetes mellitus. Alcohol Alcohol can cause sudden decreases in blood glucose (hypoglycemia), especially if you use insulin or take certain medicines for diabetes mellitus. Hypoglycemia can be a life-threatening condition. Symptoms of hypoglycemia (sleepiness, dizziness, and disorientation) are similar to symptoms of having too much alcohol. If your health care provider has given you approval to drink alcohol, do so in moderation and use the following guidelines:  Women should not have more than one drink per day, and men  should not have more than two drinks per day. One drink is equal to: ? 12 oz of beer. ? 5 oz of wine. ? 1 oz of hard liquor.  Do not drink on an empty stomach.  Keep yourself hydrated. Have water, diet soda, or unsweetened iced tea.  Regular soda, juice, and other mixers might contain a lot of carbohydrates and should be counted.  What foods are not recommended? As you make food choices, it is important to remember that all foods are not the same. Some foods have fewer nutrients per serving than other foods, even though they might have the same number of calories or carbohydrates. It is difficult to get your body what it needs when you eat foods with fewer nutrients. Examples of foods that you should avoid that are high in calories and carbohydrates but low in nutrients include:  Trans fats (most processed foods list trans fats on the Nutrition Facts label).  Regular soda.  Juice.  Candy.  Sweets, such as cake, pie, doughnuts, and cookies.  Fried foods.  What foods can I eat? Eat nutrient-rich foods, which will nourish your body and keep you healthy. The food you should eat also will depend on several factors, including:  The calories you need.  The medicines you take.  Your weight.  Your blood glucose level.  Your blood pressure level.  Your cholesterol level.  You should eat a variety of foods, including:  Protein. ? Lean cuts of meat. ? Proteins low in saturated fats, such as fish, egg whites, and beans. Avoid processed meats.  Fruits and vegetables. ? Fruits and vegetables that may help control blood glucose levels, such as apples,   mangoes, and yams.  Dairy products. ? Choose fat-free or low-fat dairy products, such as milk, yogurt, and cheese.  Grains, bread, pasta, and rice. ? Choose whole grain products, such as multigrain bread, whole oats, and brown rice. These foods may help control blood pressure.  Fats. ? Foods containing healthful fats, such as  nuts, avocado, olive oil, canola oil, and fish.  Does everyone with diabetes mellitus have the same meal plan? Because every person with diabetes mellitus is different, there is not one meal plan that works for everyone. It is very important that you meet with a dietitian who will help you create a meal plan that is just right for you. This information is not intended to replace advice given to you by your health care provider. Make sure you discuss any questions you have with your health care provider. Document Released: 09/04/2005 Document Revised: 05/15/2016 Document Reviewed: 11/04/2013 Elsevier Interactive Patient Education  2017 [employer]  

## 2017-10-07 NOTE — Telephone Encounter (Signed)
Met with the patient when he was in the clinic today for his appointment.  He stated that he has housing through the [employer] program and receives 1 meal/day. Disability hearing scheduled for 11/19/17. Provided him with resources for free meals in [place] as well as information about the monthly free food market at [facility].
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
## 2017-10-08 LAB — BASIC METABOLIC PANEL
BUN/Creatinine Ratio: 10 (ref 9–20)
BUN: 17 mg/dL (ref 6–24)
CO2: 25 mmol/L (ref 20–29)
Calcium: 9.5 mg/dL (ref 8.7–10.2)
Chloride: 96 mmol/L (ref 96–106)
Creatinine, Ser: 1.78 mg/dL — ABNORMAL HIGH (ref 0.76–1.27)
GFR calc Af Amer: 48 mL/min/{1.73_m2} — ABNORMAL LOW (ref >59)
GFR calc non Af Amer: 41 mL/min/{1.73_m2} — ABNORMAL LOW (ref >59)
Glucose: 151 mg/dL — ABNORMAL HIGH (ref 65–99)
Potassium: 4.1 mmol/L (ref 3.5–5.2)
Sodium: 139 mmol/L (ref 134–144)

## 2017-10-09 ENCOUNTER — Telehealth: Payer: Self-pay

## 2017-10-09 IMAGING — DX DG ANKLE COMPLETE 3+V*R*
3 series · 3 of 3 positions shown · non-contrast
Comparison: 09/11/2016 ankle radiographs.

CLINICAL DATA: 54 y/o  M; status post fall with lateral ankle pain.

EXAM:
RIGHT ANKLE - COMPLETE 3+ VIEW

[ankle ap]
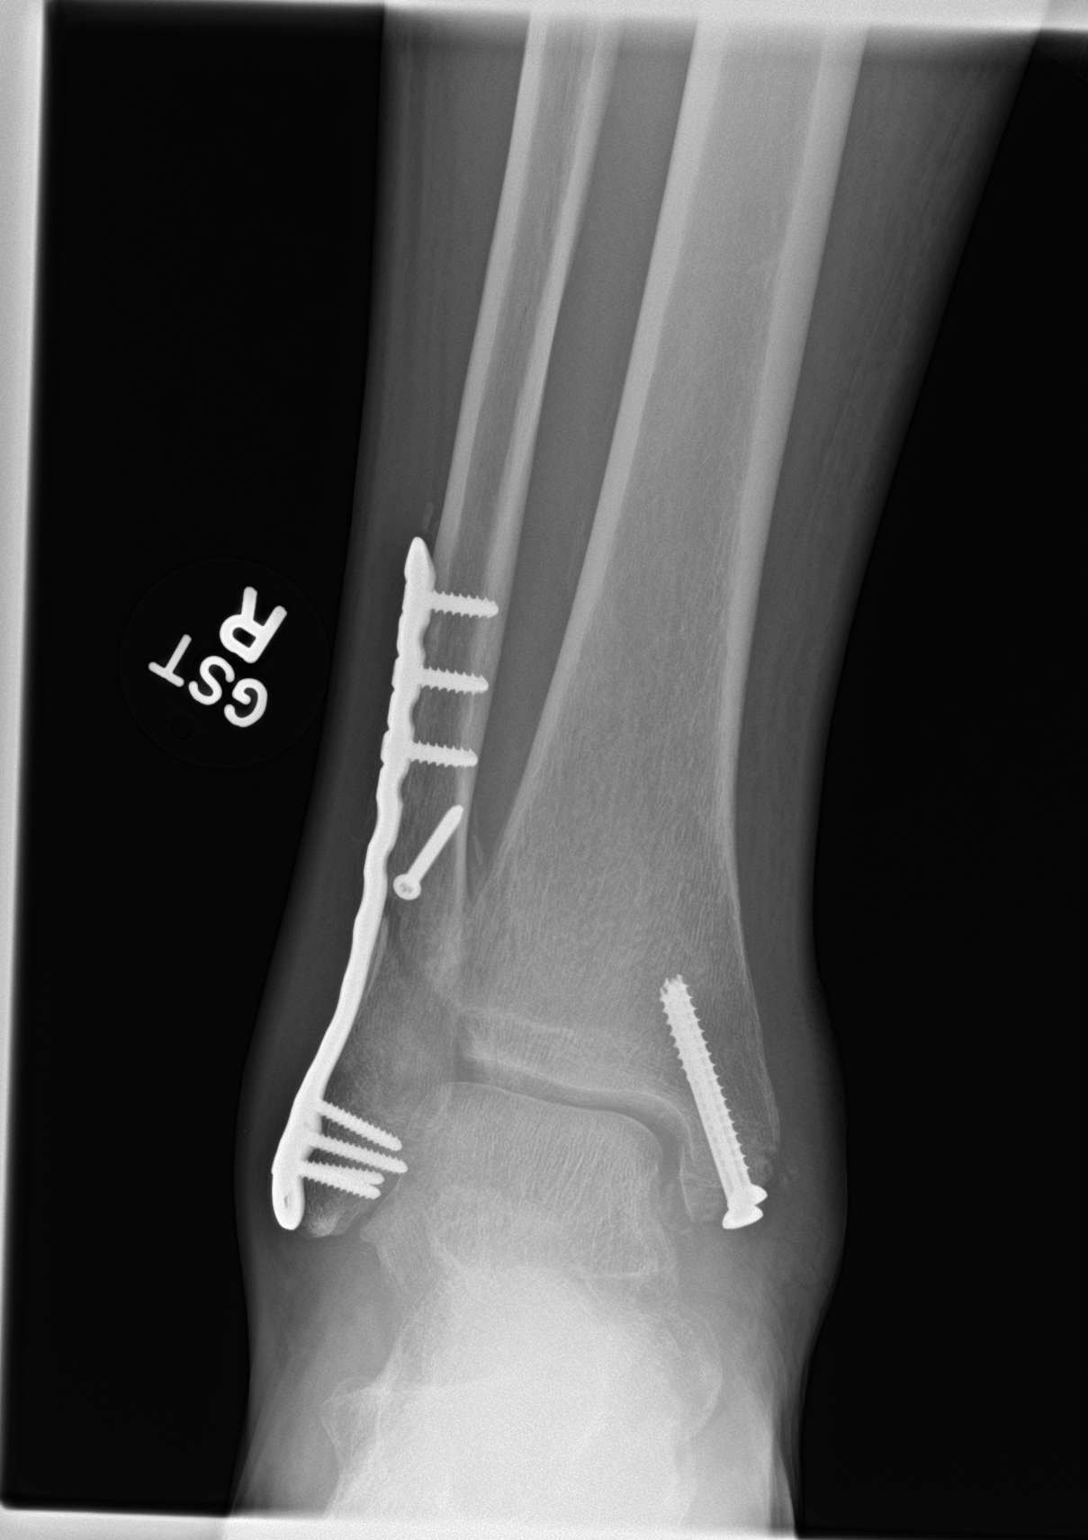

[ankle obl]
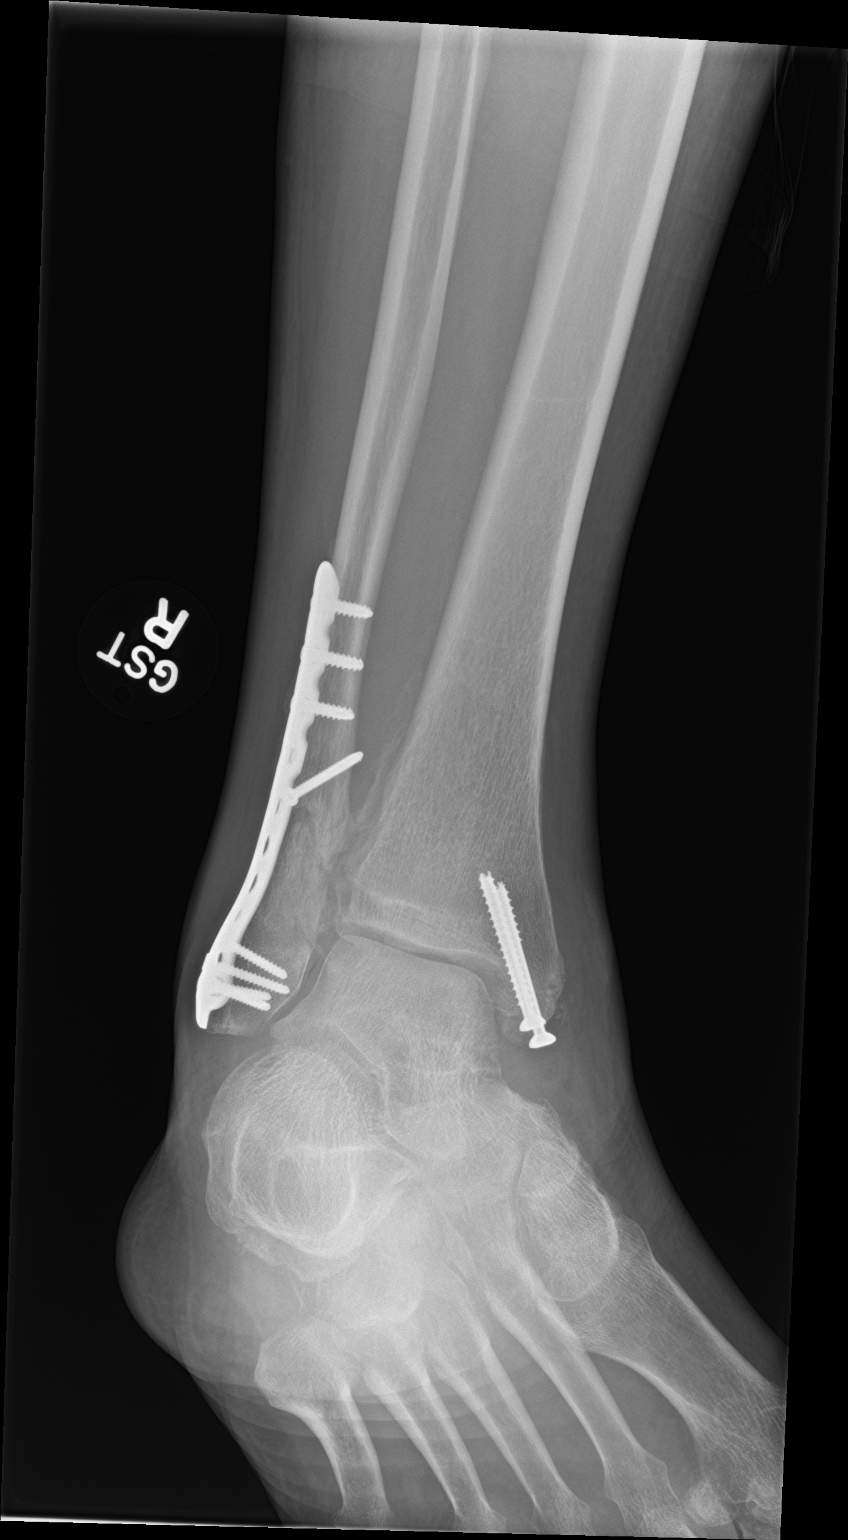

[ankle lat]
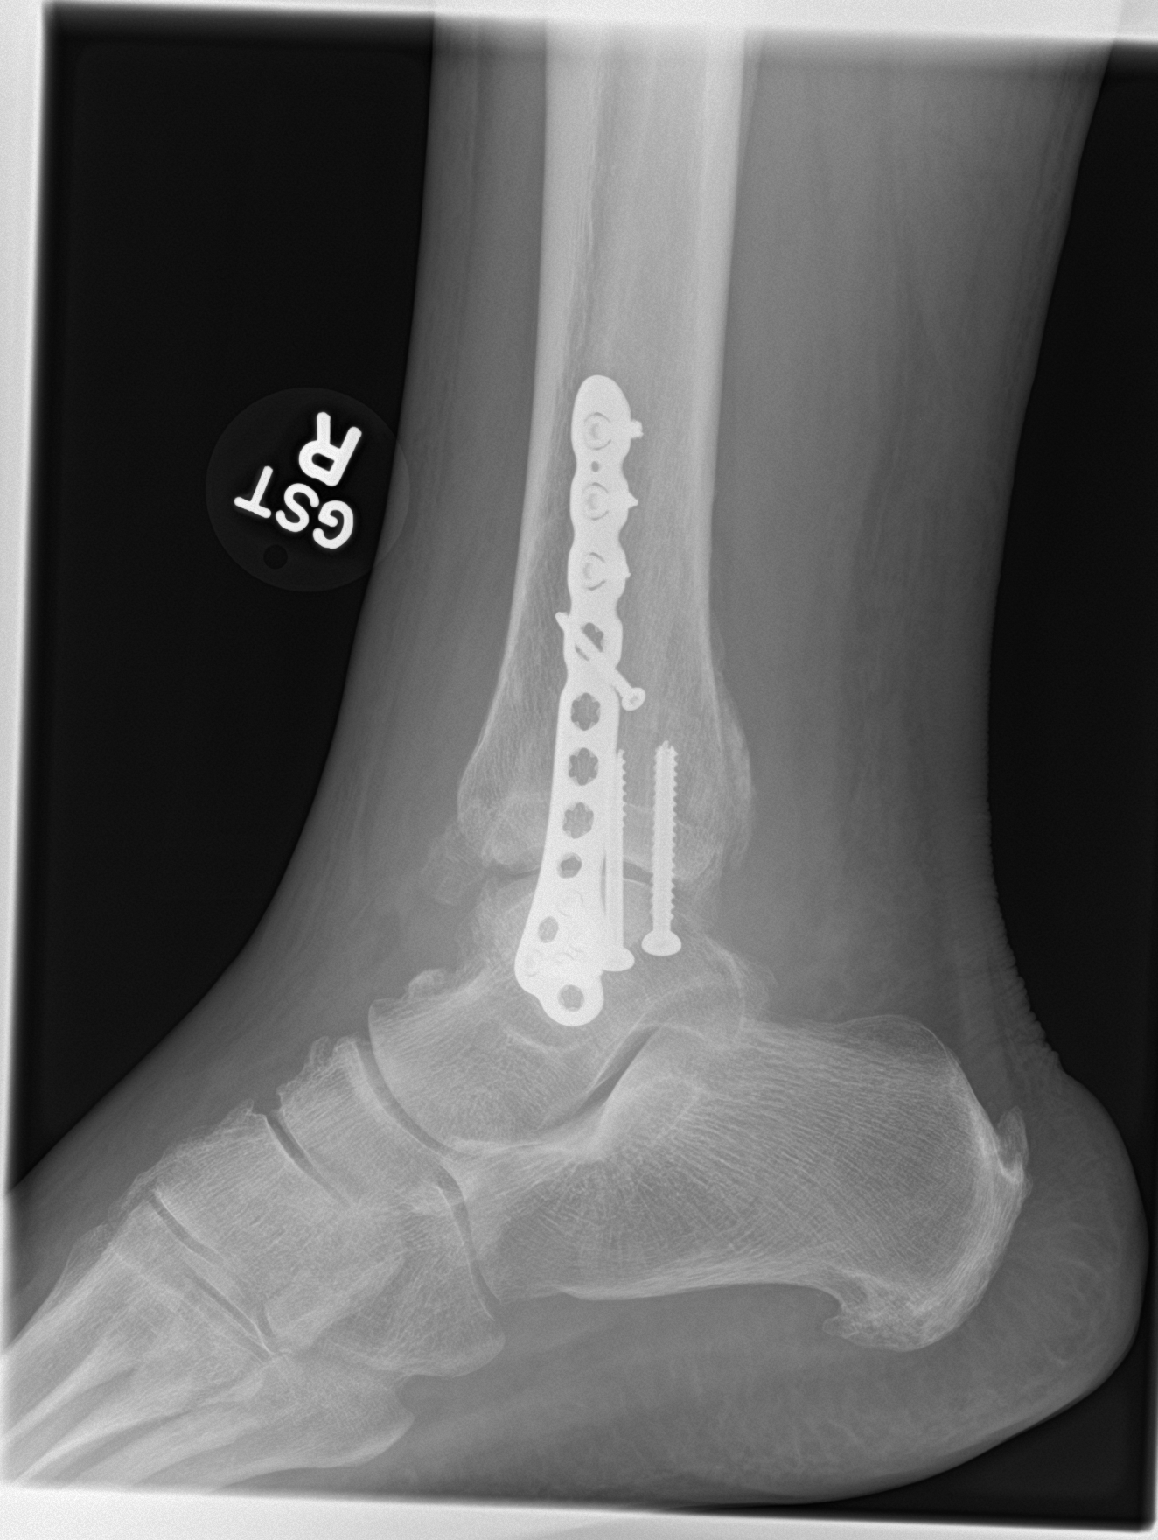

[3 of 3 positions shown; findings below may reference images not displayed]

FINDINGS: Bimalleolar fractures fixed by a distal fibular plate and screws.
Fracture lines are still present and there is no appreciate bridging
callus. Hardware is intact. No new acute fracture identified. Talar
dome is intact and ankle mortise is symmetric. Moderate dorsal and
plantar calcaneal enthesophytes. Intertarsal osteoarthrosis with
productive changes.
IMPRESSION: Fixed bimalleolar fractures, hardware is intact, no new fracture
identified.

By: Perecsenyi Maraczy M.D.

## 2017-10-09 IMAGING — CT CT HEAD W/O CM
4 of 8 series · 19 of 47 positions shown, 21 images · non-contrast
Comparison: Head CT dated 09/18/2016 an MRI dated 09/18/2016

CLINICAL DATA: 54-year-old male with fall, nausea vomiting and
dizziness.

EXAM:
CT HEAD WITHOUT CONTRAST
CT CERVICAL SPINE WITHOUT CONTRAST
TECHNIQUE: Multidetector CT imaging of the head and cervical spine was
performed following the standard protocol without intravenous
contrast. Multiplanar CT image reconstructions of the cervical spine
were also generated.

[Series 302: soft tissue, idose (2) · axial · 0.36mm/px · z∈[+53,+211]mm · 8 of 103 slices shown, 10 images]
[im 12/103  brain]
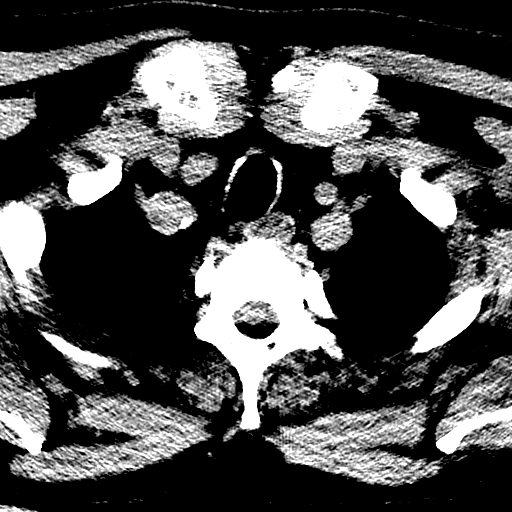
[im 12/103  bone]
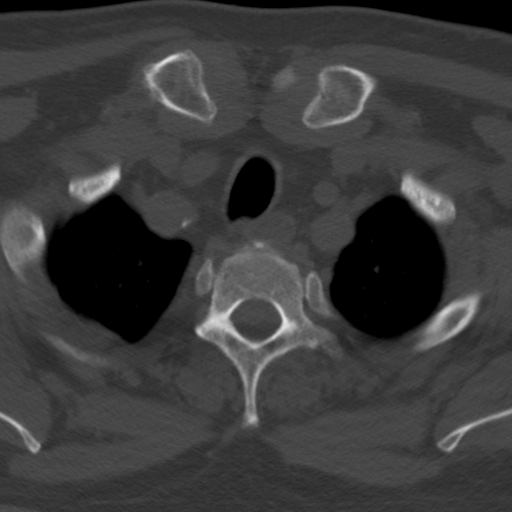
[im 23/103  brain]
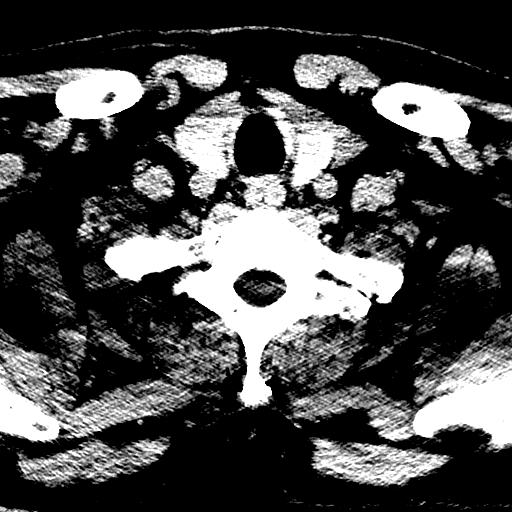
[im 35/103  brain]
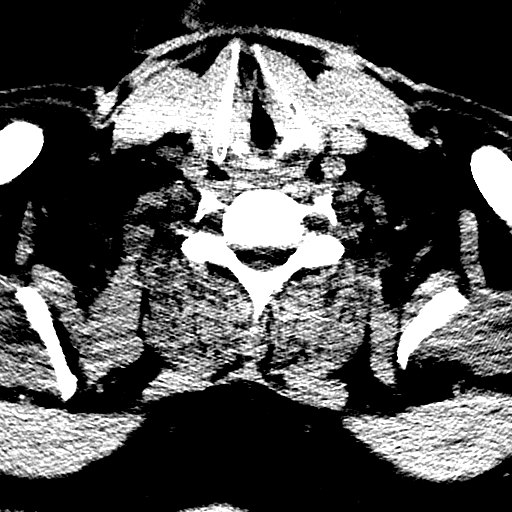
[im 46/103  brain]
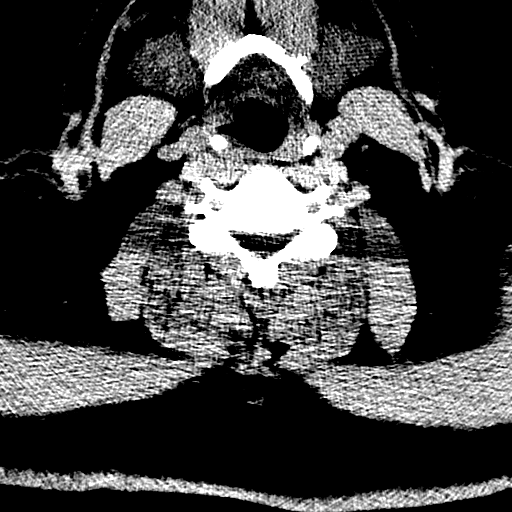
[im 57/103  brain]
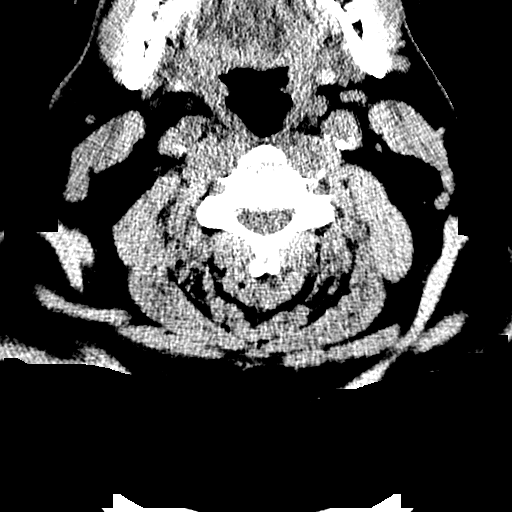
[im 57/103  bone]
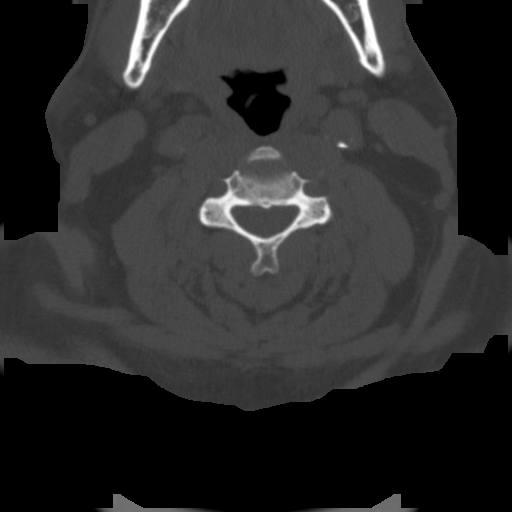
[im 69/103  brain]
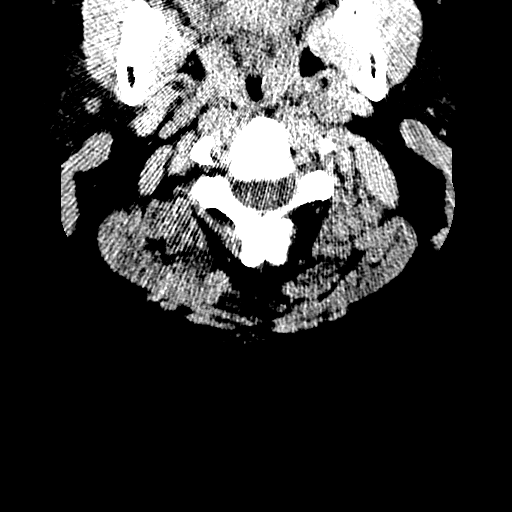
[im 80/103  brain]
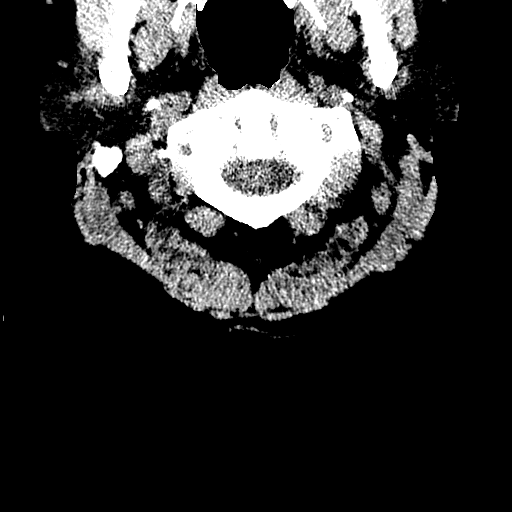
[im 91/103  brain]
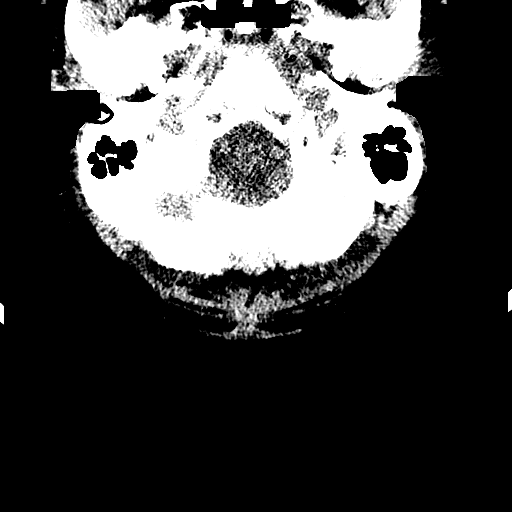

[Series 304: coronal, idose (2) · coronal · 0.36mm/px · 2 of 93 slices shown]
[im 62/93  brain]
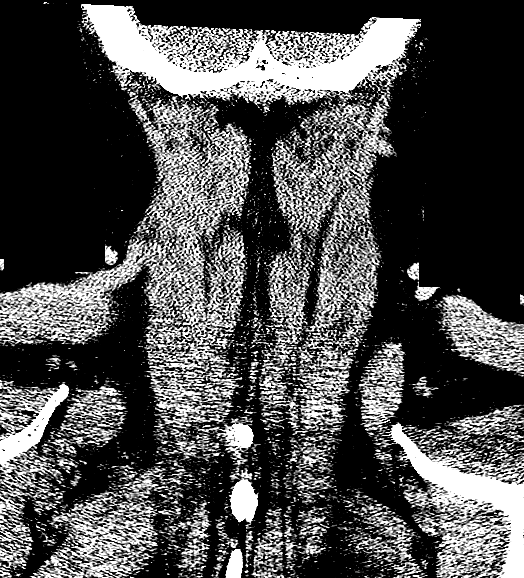
[im 77/93  brain]
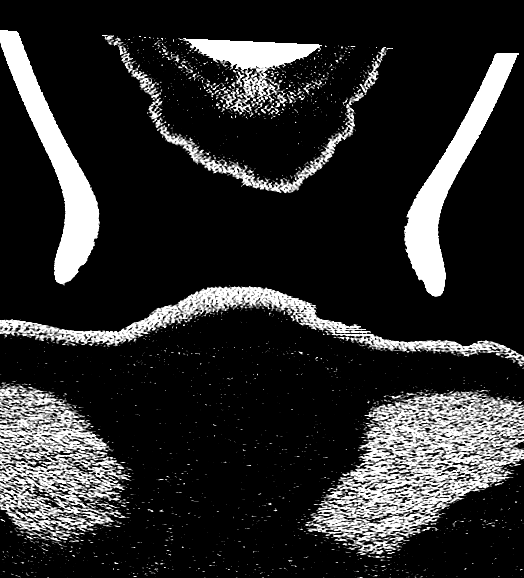

[Series 305: sagittal, idose (2) · sagittal · 0.34mm/px · 2 of 93 slices shown]
[im 31/93  brain]
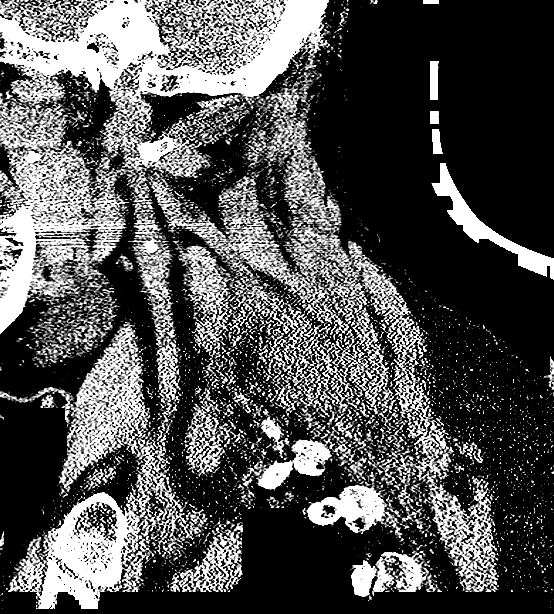
[im 62/93  brain]
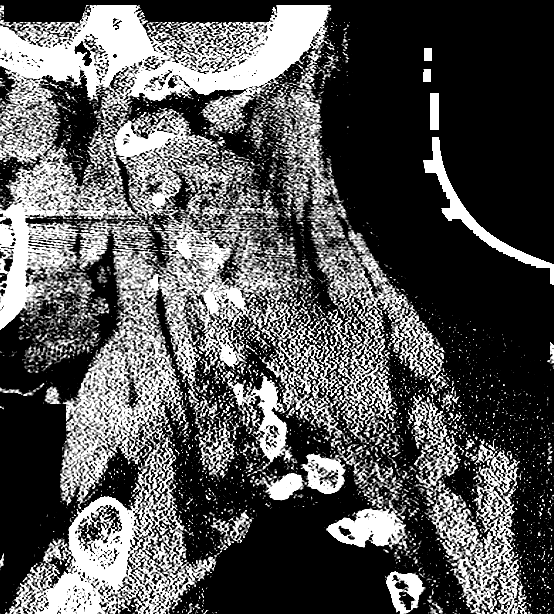

[Series 306: orthogonals, idose (2) · axial · 0.48mm/px · z∈[+22,+150]mm · 7 of 103 slices shown]
[im 12/103  brain]
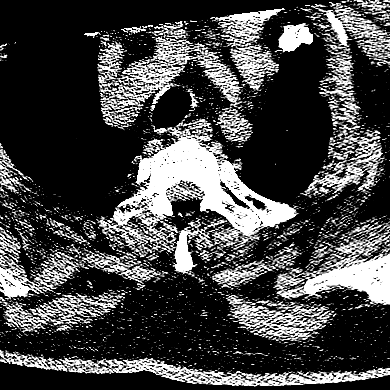
[im 23/103  brain]
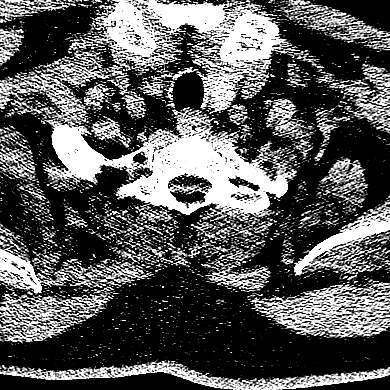
[im 35/103  brain]
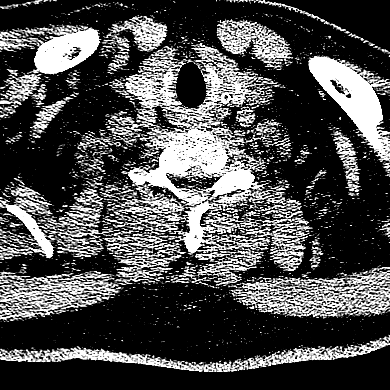
[im 46/103  brain]
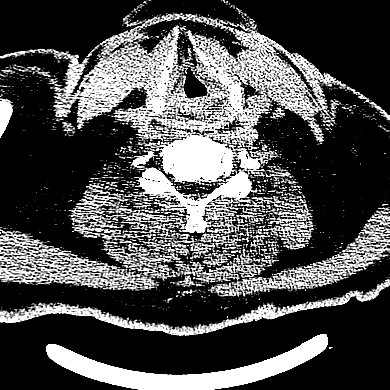
[im 57/103  brain]
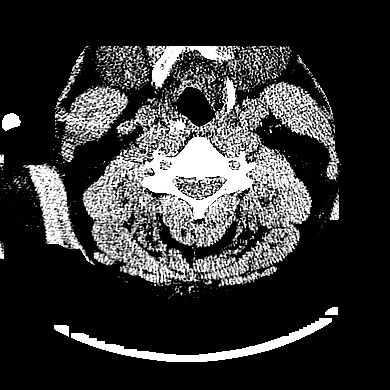
[im 69/103  brain]
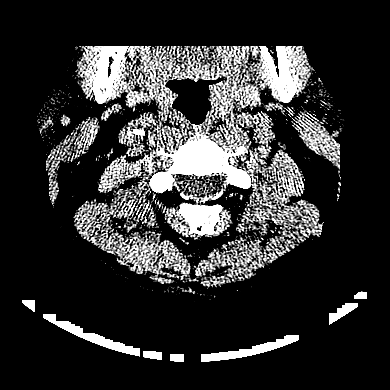
[im 80/103  brain]
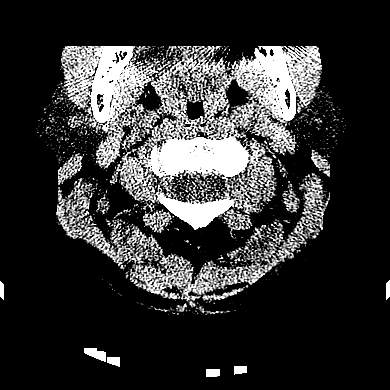

[19 of 47 positions shown; findings below may reference images not displayed]

FINDINGS: CT HEAD FINDINGS

Brain: The ventricles and sulci are appropriate in size for the
patient's age. Minimal periventricular and deep white matter chronic
microvascular ischemic changes noted. There is no acute intracranial
hemorrhage. Apparent crescentic density isoattenuating to the matter
along the left temporal lobe (series 201 image 12 and sagittal
series 203 image 37) is most likely artifactual. There is no mass
effect or midline shift.

Vascular: No hyperdense vessel or unexpected calcification.

Skull: Normal. Negative for fracture or focal lesion.

Sinuses/Orbits: No acute finding.

Other: None

CT CERVICAL SPINE FINDINGS

Alignment: No acute fracture or subluxation. Multilevel degenerative
changes most prominent at C4-C6 with bone spurring and osteophyte.
There is mild reversal of normal cervical lordosis at level. There
is small to moderate narrowing of the central canal at C4-C6.

Skull base and vertebrae: No acute fracture. No primary bone lesion
or focal pathologic process.

Soft tissues and spinal canal: No prevertebral fluid or swelling. No
visible canal hematoma.

Disc levels: Multilevel degenerative changes with loss of disc space
most prominent at C5-C6 with

Upper chest: Negative.

Other: None
IMPRESSION: No acute intracranial pathology.

No acute/traumatic cervical spine pathology.

## 2017-10-09 NOTE — Telephone Encounter (Signed)
Pt was called and informed to return phone call for lab results. 

## 2017-11-02 ENCOUNTER — Emergency Department (HOSPITAL_COMMUNITY)
Admission: EM | Admit: 2017-11-02 | Discharge: 2017-11-02 | Disposition: A | Discharge status: Home / Self Care | Payer: Medicaid Other | Attending: Emergency Medicine | Admitting: Emergency Medicine

## 2017-11-02 ENCOUNTER — Other Ambulatory Visit: Payer: Self-pay

## 2017-11-02 ENCOUNTER — Encounter (HOSPITAL_COMMUNITY): Payer: Self-pay | Admitting: *Deleted

## 2017-11-02 ENCOUNTER — Emergency Department (HOSPITAL_COMMUNITY): Payer: Medicaid Other

## 2017-11-02 DIAGNOSIS — Z7901 Long term (current) use of anticoagulants: Secondary | ICD-10-CM | POA: Diagnosis not present

## 2017-11-02 DIAGNOSIS — F17200 Nicotine dependence, unspecified, uncomplicated: Secondary | ICD-10-CM | POA: Insufficient documentation

## 2017-11-02 DIAGNOSIS — M542 Cervicalgia: Secondary | ICD-10-CM | POA: Diagnosis present

## 2017-11-02 DIAGNOSIS — I5032 Chronic diastolic (congestive) heart failure: Secondary | ICD-10-CM | POA: Insufficient documentation

## 2017-11-02 DIAGNOSIS — Z8546 Personal history of malignant neoplasm of prostate: Secondary | ICD-10-CM | POA: Diagnosis not present

## 2017-11-02 DIAGNOSIS — I130 Hypertensive heart and chronic kidney disease with heart failure and stage 1 through stage 4 chronic kidney disease, or unspecified chronic kidney disease: Secondary | ICD-10-CM | POA: Insufficient documentation

## 2017-11-02 DIAGNOSIS — Z79899 Other long term (current) drug therapy: Secondary | ICD-10-CM | POA: Insufficient documentation

## 2017-11-02 DIAGNOSIS — J189 Pneumonia, unspecified organism: Secondary | ICD-10-CM | POA: Insufficient documentation

## 2017-11-02 DIAGNOSIS — E1122 Type 2 diabetes mellitus with diabetic chronic kidney disease: Secondary | ICD-10-CM | POA: Insufficient documentation

## 2017-11-02 DIAGNOSIS — Z794 Long term (current) use of insulin: Secondary | ICD-10-CM | POA: Diagnosis not present

## 2017-11-02 DIAGNOSIS — N183 Chronic kidney disease, stage 3 (moderate): Secondary | ICD-10-CM | POA: Diagnosis not present

## 2017-11-02 DIAGNOSIS — R0789 Other chest pain: Secondary | ICD-10-CM | POA: Diagnosis not present

## 2017-11-02 LAB — BASIC METABOLIC PANEL
Anion gap: 10 (ref 5–15)
BUN: 13 mg/dL (ref 6–20)
CO2: 29 mmol/L (ref 22–32)
Calcium: 8.5 mg/dL — ABNORMAL LOW (ref 8.9–10.3)
Chloride: 96 mmol/L — ABNORMAL LOW (ref 101–111)
Creatinine, Ser: 1.68 mg/dL — ABNORMAL HIGH (ref 0.61–1.24)
GFR calc Af Amer: 51 mL/min — ABNORMAL LOW (ref >60)
GFR calc non Af Amer: 44 mL/min — ABNORMAL LOW (ref >60)
Glucose, Bld: 200 mg/dL — ABNORMAL HIGH (ref 65–99)
Potassium: 3 mmol/L — ABNORMAL LOW (ref 3.5–5.1)
Sodium: 135 mmol/L (ref 135–145)

## 2017-11-02 LAB — CBC
HCT: 38.1 % — ABNORMAL LOW (ref 39.0–52.0)
Hemoglobin: 13.4 g/dL (ref 13.0–17.0)
MCH: 33.4 pg (ref 26.0–34.0)
MCHC: 35.2 g/dL (ref 30.0–36.0)
MCV: 95 fL (ref 78.0–100.0)
Platelets: 188 10*3/uL (ref 150–400)
RBC: 4.01 MIL/uL — ABNORMAL LOW (ref 4.22–5.81)
RDW: 12.2 % (ref 11.5–15.5)
WBC: 7.5 10*3/uL (ref 4.0–10.5)

## 2017-11-02 LAB — I-STAT TROPONIN, ED
Troponin i, poc: 0.04 ng/mL (ref 0.00–0.08)
Troponin i, poc: 0.04 ng/mL (ref 0.00–0.08)

## 2017-11-02 MED ORDER — IOPAMIDOL (ISOVUE-370) INJECTION 76%
INTRAVENOUS | Status: AC
  Administered 2017-11-02: 50 mL
  Filled 2017-11-02: qty 50

## 2017-11-02 MED ORDER — BACLOFEN 10 MG PO TABS
10.0000 mg | ORAL_TABLET | Freq: Once | ORAL | Status: AC
  Administered 2017-11-02: 10 mg via ORAL
  Filled 2017-11-02: qty 1

## 2017-11-02 MED ORDER — VALACYCLOVIR HCL 1 G PO TABS: 1000.0000 mg | ORAL_TABLET | Freq: Three times a day (TID) | ORAL | 0 refills | Status: DC

## 2017-11-02 MED ORDER — FENTANYL CITRATE (PF) 100 MCG/2ML IJ SOLN
50.0000 ug | Freq: Once | INTRAMUSCULAR | Status: AC
  Administered 2017-11-02: 50 ug via INTRAVENOUS
  Filled 2017-11-02: qty 2

## 2017-11-02 MED ORDER — SODIUM CHLORIDE 0.9 % IV BOLUS (SEPSIS)
500.0000 mL | Freq: Once | INTRAVENOUS | Status: AC
  Administered 2017-11-02: 500 mL via INTRAVENOUS

## 2017-11-02 MED ORDER — DOXYCYCLINE HYCLATE 100 MG PO CAPS: 100.0000 mg | ORAL_CAPSULE | Freq: Two times a day (BID) | ORAL | 0 refills | Status: DC

## 2017-11-02 MED ORDER — ACETAMINOPHEN-CODEINE #3 300-30 MG PO TABS: 1.0000 | ORAL_TABLET | Freq: Four times a day (QID) | ORAL | 0 refills | Status: DC | PRN

## 2017-11-02 MED ORDER — LORAZEPAM 2 MG/ML IJ SOLN
1.0000 mg | Freq: Once | INTRAMUSCULAR | Status: AC
  Administered 2017-11-02: 1 mg via INTRAVENOUS
  Filled 2017-11-02: qty 1

## 2017-11-02 MED ORDER — BACLOFEN 20 MG PO TABS: 20.0000 mg | ORAL_TABLET | Freq: Three times a day (TID) | ORAL | 0 refills | Status: DC

## 2017-11-02 NOTE — ED Notes (Signed)
Patient transported to X-ray 

## 2017-11-02 NOTE — ED Triage Notes (Signed)
Pt arrived by EMS for L sided neck pain that woke him from sleep; difficulty looking to the left. Also reports chest pain since yesterday morning with increased swelling to R ankle. Pain in neck is constant with sharp shooting pains at times.

## 2017-11-02 NOTE — Discharge Instructions (Signed)
You have been diagnosed with neck pain.  You may have acute torticollis which is an acute spasm of the muscles of your neck.  You may also have something called occipital neuralgia which is an inflammation of the nerve along the scalp line.  This can be very painful.  It usually resolves in several days.  You may also have on erupted shingles and if you notice development of a rash along her scalp this is very likely the diagnosis.  I am discharging you with medication that will cover for an eruption of shingles and he may begin taking it.  The CT scan of your chest also showed pneumonia in your upper lung.  You may begin taking this antibiotic.  You will need a repeat noncontrasted chest CT scan after treatment.  You will need to follow-up with your primary care physician to schedule this.  Please take all of the antibiotic as directed.
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
## 2017-11-02 NOTE — ED Notes (Signed)
Pt called out for pain medication; provider aware

## 2017-11-06 ENCOUNTER — Inpatient Hospital Stay (HOSPITAL_COMMUNITY)
Admission: EM | Admit: 2017-11-06 | Discharge: 2017-11-11 | DRG: 091 | Disposition: A | Discharge status: Home / Self Care | Payer: Medicaid Other | Attending: Family Medicine | Admitting: Family Medicine

## 2017-11-06 ENCOUNTER — Emergency Department (HOSPITAL_COMMUNITY): Payer: Medicaid Other

## 2017-11-06 ENCOUNTER — Encounter (HOSPITAL_COMMUNITY): Payer: Self-pay | Admitting: Emergency Medicine

## 2017-11-06 ENCOUNTER — Other Ambulatory Visit: Payer: Self-pay

## 2017-11-06 ENCOUNTER — Inpatient Hospital Stay (HOSPITAL_COMMUNITY): Payer: Medicaid Other

## 2017-11-06 DIAGNOSIS — I4892 Unspecified atrial flutter: Secondary | ICD-10-CM | POA: Diagnosis present

## 2017-11-06 DIAGNOSIS — F17200 Nicotine dependence, unspecified, uncomplicated: Secondary | ICD-10-CM | POA: Diagnosis present

## 2017-11-06 DIAGNOSIS — R402432 Glasgow coma scale score 3-8, at arrival to emergency department: Secondary | ICD-10-CM

## 2017-11-06 DIAGNOSIS — Z23 Encounter for immunization: Secondary | ICD-10-CM

## 2017-11-06 DIAGNOSIS — E1122 Type 2 diabetes mellitus with diabetic chronic kidney disease: Secondary | ICD-10-CM | POA: Diagnosis present

## 2017-11-06 DIAGNOSIS — F11129 Opioid abuse with intoxication, unspecified: Secondary | ICD-10-CM | POA: Diagnosis present

## 2017-11-06 DIAGNOSIS — I5032 Chronic diastolic (congestive) heart failure: Secondary | ICD-10-CM | POA: Diagnosis present

## 2017-11-06 DIAGNOSIS — E785 Hyperlipidemia, unspecified: Secondary | ICD-10-CM | POA: Diagnosis present

## 2017-11-06 DIAGNOSIS — I5042 Chronic combined systolic (congestive) and diastolic (congestive) heart failure: Secondary | ICD-10-CM | POA: Diagnosis present

## 2017-11-06 DIAGNOSIS — R40243 Glasgow coma scale score 3-8, unspecified time: Secondary | ICD-10-CM | POA: Diagnosis present

## 2017-11-06 DIAGNOSIS — E118 Type 2 diabetes mellitus with unspecified complications: Secondary | ICD-10-CM

## 2017-11-06 DIAGNOSIS — Z79899 Other long term (current) drug therapy: Secondary | ICD-10-CM

## 2017-11-06 DIAGNOSIS — E1165 Type 2 diabetes mellitus with hyperglycemia: Secondary | ICD-10-CM | POA: Diagnosis present

## 2017-11-06 DIAGNOSIS — I4891 Unspecified atrial fibrillation: Secondary | ICD-10-CM | POA: Diagnosis present

## 2017-11-06 DIAGNOSIS — Z5900 Homelessness unspecified: Secondary | ICD-10-CM

## 2017-11-06 DIAGNOSIS — I428 Other cardiomyopathies: Secondary | ICD-10-CM | POA: Diagnosis not present

## 2017-11-06 DIAGNOSIS — G92 Toxic encephalopathy: Secondary | ICD-10-CM | POA: Diagnosis present

## 2017-11-06 DIAGNOSIS — I10 Essential (primary) hypertension: Secondary | ICD-10-CM | POA: Diagnosis not present

## 2017-11-06 DIAGNOSIS — E7800 Pure hypercholesterolemia, unspecified: Secondary | ICD-10-CM | POA: Diagnosis present

## 2017-11-06 DIAGNOSIS — F17210 Nicotine dependence, cigarettes, uncomplicated: Secondary | ICD-10-CM | POA: Diagnosis present

## 2017-11-06 DIAGNOSIS — Z7952 Long term (current) use of systemic steroids: Secondary | ICD-10-CM

## 2017-11-06 DIAGNOSIS — F1910 Other psychoactive substance abuse, uncomplicated: Secondary | ICD-10-CM

## 2017-11-06 DIAGNOSIS — E1142 Type 2 diabetes mellitus with diabetic polyneuropathy: Secondary | ICD-10-CM | POA: Diagnosis present

## 2017-11-06 DIAGNOSIS — J9601 Acute respiratory failure with hypoxia: Secondary | ICD-10-CM | POA: Diagnosis not present

## 2017-11-06 DIAGNOSIS — N183 Chronic kidney disease, stage 3 unspecified: Secondary | ICD-10-CM | POA: Diagnosis present

## 2017-11-06 DIAGNOSIS — G9340 Encephalopathy, unspecified: Secondary | ICD-10-CM

## 2017-11-06 DIAGNOSIS — E876 Hypokalemia: Secondary | ICD-10-CM | POA: Diagnosis not present

## 2017-11-06 DIAGNOSIS — K7290 Hepatic failure, unspecified without coma: Secondary | ICD-10-CM | POA: Diagnosis present

## 2017-11-06 DIAGNOSIS — K7460 Unspecified cirrhosis of liver: Secondary | ICD-10-CM

## 2017-11-06 DIAGNOSIS — R32 Unspecified urinary incontinence: Secondary | ICD-10-CM | POA: Diagnosis present

## 2017-11-06 DIAGNOSIS — F329 Major depressive disorder, single episode, unspecified: Secondary | ICD-10-CM | POA: Diagnosis present

## 2017-11-06 DIAGNOSIS — I130 Hypertensive heart and chronic kidney disease with heart failure and stage 1 through stage 4 chronic kidney disease, or unspecified chronic kidney disease: Secondary | ICD-10-CM | POA: Diagnosis present

## 2017-11-06 DIAGNOSIS — K219 Gastro-esophageal reflux disease without esophagitis: Secondary | ICD-10-CM | POA: Diagnosis present

## 2017-11-06 DIAGNOSIS — I429 Cardiomyopathy, unspecified: Secondary | ICD-10-CM | POA: Diagnosis present

## 2017-11-06 DIAGNOSIS — R1310 Dysphagia, unspecified: Secondary | ICD-10-CM | POA: Diagnosis present

## 2017-11-06 DIAGNOSIS — E1140 Type 2 diabetes mellitus with diabetic neuropathy, unspecified: Secondary | ICD-10-CM | POA: Diagnosis present

## 2017-11-06 DIAGNOSIS — T783XXA Angioneurotic edema, initial encounter: Secondary | ICD-10-CM | POA: Diagnosis present

## 2017-11-06 DIAGNOSIS — I5022 Chronic systolic (congestive) heart failure: Secondary | ICD-10-CM | POA: Diagnosis not present

## 2017-11-06 DIAGNOSIS — J9690 Respiratory failure, unspecified, unspecified whether with hypoxia or hypercapnia: Secondary | ICD-10-CM

## 2017-11-06 DIAGNOSIS — Z7901 Long term (current) use of anticoagulants: Secondary | ICD-10-CM

## 2017-11-06 DIAGNOSIS — R069 Unspecified abnormalities of breathing: Secondary | ICD-10-CM

## 2017-11-06 DIAGNOSIS — Z96652 Presence of left artificial knee joint: Secondary | ICD-10-CM | POA: Diagnosis present

## 2017-11-06 DIAGNOSIS — Z888 Allergy status to other drugs, medicaments and biological substances status: Secondary | ICD-10-CM

## 2017-11-06 DIAGNOSIS — Z8659 Personal history of other mental and behavioral disorders: Secondary | ICD-10-CM

## 2017-11-06 DIAGNOSIS — R197 Diarrhea, unspecified: Secondary | ICD-10-CM | POA: Diagnosis present

## 2017-11-06 DIAGNOSIS — E1121 Type 2 diabetes mellitus with diabetic nephropathy: Secondary | ICD-10-CM | POA: Diagnosis present

## 2017-11-06 DIAGNOSIS — B182 Chronic viral hepatitis C: Secondary | ICD-10-CM | POA: Diagnosis present

## 2017-11-06 DIAGNOSIS — E2740 Unspecified adrenocortical insufficiency: Secondary | ICD-10-CM | POA: Diagnosis present

## 2017-11-06 DIAGNOSIS — M1990 Unspecified osteoarthritis, unspecified site: Secondary | ICD-10-CM | POA: Diagnosis present

## 2017-11-06 DIAGNOSIS — E119 Type 2 diabetes mellitus without complications: Secondary | ICD-10-CM

## 2017-11-06 DIAGNOSIS — Z794 Long term (current) use of insulin: Secondary | ICD-10-CM

## 2017-11-06 LAB — I-STAT ARTERIAL BLOOD GAS, ED
Acid-Base Excess: 5 mmol/L — ABNORMAL HIGH (ref 0.0–2.0)
Acid-Base Excess: 5 mmol/L — ABNORMAL HIGH (ref 0.0–2.0)
Bicarbonate: 30.8 mmol/L — ABNORMAL HIGH (ref 20.0–28.0)
Bicarbonate: 26.7 mmol/L (ref 20.0–28.0)
O2 Saturation: 98 %
O2 Saturation: 97 %
Patient temperature: 94
Patient temperature: 96
TCO2: 32 mmol/L (ref 22–32)
TCO2: 28 mmol/L (ref 22–32)
pCO2 arterial: 45.2 mmHg (ref 32.0–48.0)
pCO2 arterial: 29.4 mmHg — ABNORMAL LOW (ref 32.0–48.0)
pH, Arterial: 7.43 (ref 7.350–7.450)
pH, Arterial: 7.561 — ABNORMAL HIGH (ref 7.350–7.450)
pO2, Arterial: 91 mmHg (ref 83.0–108.0)
pO2, Arterial: 70 mmHg — ABNORMAL LOW (ref 83.0–108.0)

## 2017-11-06 LAB — CBC WITH DIFFERENTIAL/PLATELET
Basophils Absolute: 0 10*3/uL (ref 0.0–0.1)
Basophils Relative: 0 %
Eosinophils Absolute: 0 10*3/uL (ref 0.0–0.7)
Eosinophils Relative: 0 %
HCT: 39.7 % (ref 39.0–52.0)
Hemoglobin: 13.6 g/dL (ref 13.0–17.0)
Lymphocytes Relative: 17 %
Lymphs Abs: 1.2 10*3/uL (ref 0.7–4.0)
MCH: 32.8 pg (ref 26.0–34.0)
MCHC: 34.3 g/dL (ref 30.0–36.0)
MCV: 95.7 fL (ref 78.0–100.0)
Monocytes Absolute: 0.4 10*3/uL (ref 0.1–1.0)
Monocytes Relative: 5 %
Neutro Abs: 5.5 10*3/uL (ref 1.7–7.7)
Neutrophils Relative %: 78 %
Platelets: 216 10*3/uL (ref 150–400)
RBC: 4.15 MIL/uL — ABNORMAL LOW (ref 4.22–5.81)
RDW: 12.4 % (ref 11.5–15.5)
WBC: 7.1 10*3/uL (ref 4.0–10.5)

## 2017-11-06 LAB — I-STAT TROPONIN, ED
Troponin i, poc: 0.1 ng/mL (ref 0.00–0.08)
Troponin i, poc: 0.03 ng/mL (ref 0.00–0.08)

## 2017-11-06 LAB — COMPREHENSIVE METABOLIC PANEL
ALT: 29 U/L (ref 17–63)
ALT: 24 U/L (ref 17–63)
AST: 37 U/L (ref 15–41)
AST: 33 U/L (ref 15–41)
Albumin: 3.3 g/dL — ABNORMAL LOW (ref 3.5–5.0)
Albumin: 2.7 g/dL — ABNORMAL LOW (ref 3.5–5.0)
Alkaline Phosphatase: 84 U/L (ref 38–126)
Alkaline Phosphatase: 66 U/L (ref 38–126)
Anion gap: 9 (ref 5–15)
Anion gap: 7 (ref 5–15)
BUN: 15 mg/dL (ref 6–20)
BUN: 15 mg/dL (ref 6–20)
CO2: 29 mmol/L (ref 22–32)
CO2: 26 mmol/L (ref 22–32)
Calcium: 9 mg/dL (ref 8.9–10.3)
Calcium: 8.3 mg/dL — ABNORMAL LOW (ref 8.9–10.3)
Chloride: 98 mmol/L — ABNORMAL LOW (ref 101–111)
Chloride: 103 mmol/L (ref 101–111)
Creatinine, Ser: 1.58 mg/dL — ABNORMAL HIGH (ref 0.61–1.24)
Creatinine, Ser: 1.4 mg/dL — ABNORMAL HIGH (ref 0.61–1.24)
GFR calc Af Amer: 54 mL/min — ABNORMAL LOW (ref >60)
GFR calc Af Amer: >60 mL/min (ref >60)
GFR calc non Af Amer: 47 mL/min — ABNORMAL LOW (ref >60)
GFR calc non Af Amer: 54 mL/min — ABNORMAL LOW (ref >60)
Glucose, Bld: 237 mg/dL — ABNORMAL HIGH (ref 65–99)
Glucose, Bld: 194 mg/dL — ABNORMAL HIGH (ref 65–99)
Potassium: 3.5 mmol/L (ref 3.5–5.1)
Potassium: 3.2 mmol/L — ABNORMAL LOW (ref 3.5–5.1)
Sodium: 136 mmol/L (ref 135–145)
Sodium: 136 mmol/L (ref 135–145)
Total Bilirubin: 0.5 mg/dL (ref 0.3–1.2)
Total Bilirubin: 0.6 mg/dL (ref 0.3–1.2)
Total Protein: 9.2 g/dL — ABNORMAL HIGH (ref 6.5–8.1)
Total Protein: 7.3 g/dL (ref 6.5–8.1)

## 2017-11-06 LAB — CBC
HCT: 36.1 % — ABNORMAL LOW (ref 39.0–52.0)
Hemoglobin: 12.4 g/dL — ABNORMAL LOW (ref 13.0–17.0)
MCH: 32.5 pg (ref 26.0–34.0)
MCHC: 34.3 g/dL (ref 30.0–36.0)
MCV: 94.5 fL (ref 78.0–100.0)
Platelets: 215 10*3/uL (ref 150–400)
RBC: 3.82 MIL/uL — ABNORMAL LOW (ref 4.22–5.81)
RDW: 12.4 % (ref 11.5–15.5)
WBC: 6.8 10*3/uL (ref 4.0–10.5)

## 2017-11-06 LAB — I-STAT CG4 LACTIC ACID, ED
Lactic Acid, Venous: 2.61 mmol/L (ref 0.5–1.9)
Lactic Acid, Venous: 1.11 mmol/L (ref 0.5–1.9)

## 2017-11-06 LAB — URINALYSIS, ROUTINE W REFLEX MICROSCOPIC
Bilirubin Urine: NEGATIVE
Glucose, UA: >=500 mg/dL — AB
Ketones, ur: NEGATIVE mg/dL
Leukocytes, UA: NEGATIVE
Nitrite: NEGATIVE
Protein, ur: 100 mg/dL — AB
Specific Gravity, Urine: 1.007 (ref 1.005–1.030)
Squamous Epithelial / LPF: NONE SEEN
pH: 6 (ref 5.0–8.0)

## 2017-11-06 LAB — TRIGLYCERIDES: Triglycerides: 59 mg/dL (ref <150)

## 2017-11-06 LAB — SALICYLATE LEVEL: Salicylate Lvl: <7 mg/dL (ref 2.8–30.0)

## 2017-11-06 LAB — PHOSPHORUS: Phosphorus: 1.8 mg/dL — ABNORMAL LOW (ref 2.5–4.6)

## 2017-11-06 LAB — GLUCOSE, CAPILLARY
Glucose-Capillary: 126 mg/dL — ABNORMAL HIGH (ref 65–99)
Glucose-Capillary: 198 mg/dL — ABNORMAL HIGH (ref 65–99)

## 2017-11-06 LAB — MAGNESIUM: Magnesium: 1.6 mg/dL — ABNORMAL LOW (ref 1.7–2.4)

## 2017-11-06 LAB — RAPID URINE DRUG SCREEN, HOSP PERFORMED
Amphetamines: NOT DETECTED
Barbiturates: NOT DETECTED
Benzodiazepines: NOT DETECTED
Cocaine: NOT DETECTED
Opiates: POSITIVE — AB
Tetrahydrocannabinol: NOT DETECTED

## 2017-11-06 LAB — CORTISOL: Cortisol, Plasma: 2.6 ug/dL

## 2017-11-06 LAB — AMMONIA: Ammonia: 65 umol/L — ABNORMAL HIGH (ref 9–35)

## 2017-11-06 LAB — CBG MONITORING, ED
Glucose-Capillary: 268 mg/dL — ABNORMAL HIGH (ref 65–99)
Glucose-Capillary: 175 mg/dL — ABNORMAL HIGH (ref 65–99)

## 2017-11-06 LAB — TSH: TSH: 1.38 u[IU]/mL (ref 0.350–4.500)

## 2017-11-06 LAB — PROTIME-INR
INR: 1.19
Prothrombin Time: 15 seconds (ref 11.4–15.2)

## 2017-11-06 LAB — APTT: aPTT: 30 seconds (ref 24–36)

## 2017-11-06 LAB — PROCALCITONIN: Procalcitonin: <0.1 ng/mL

## 2017-11-06 LAB — ACETAMINOPHEN LEVEL: Acetaminophen (Tylenol), Serum: <10 ug/mL — ABNORMAL LOW (ref 10–30)

## 2017-11-06 LAB — MRSA PCR SCREENING: MRSA by PCR: NEGATIVE

## 2017-11-06 LAB — ETHANOL: Alcohol, Ethyl (B): <10 mg/dL (ref <10)

## 2017-11-06 LAB — LIPASE, BLOOD: Lipase: 16 U/L (ref 11–51)

## 2017-11-06 LAB — BRAIN NATRIURETIC PEPTIDE: B Natriuretic Peptide: 335.5 pg/mL — ABNORMAL HIGH (ref 0.0–100.0)

## 2017-11-06 MED ORDER — NALOXONE HCL 2 MG/2ML IJ SOSY
2.0000 mg | PREFILLED_SYRINGE | Freq: Once | INTRAMUSCULAR | Status: AC
  Administered 2017-11-06: 2 mg via INTRAVENOUS

## 2017-11-06 MED ORDER — NALOXONE HCL 2 MG/2ML IJ SOSY
PREFILLED_SYRINGE | INTRAMUSCULAR | Status: AC
  Filled 2017-11-06: qty 2

## 2017-11-06 MED ORDER — FENTANYL CITRATE (PF) 100 MCG/2ML IJ SOLN: 100.0000 ug | INTRAMUSCULAR | Status: DC | PRN

## 2017-11-06 MED ORDER — HYDROCORTISONE NA SUCCINATE PF 100 MG IJ SOLR
50.0000 mg | Freq: Four times a day (QID) | INTRAMUSCULAR | Status: DC
  Administered 2017-11-06 – 2017-11-09 (×12): 50 mg via INTRAVENOUS
  Filled 2017-11-06 (×3): qty 2
  Filled 2017-11-06 (×11): qty 1

## 2017-11-06 MED ORDER — NALOXONE HCL 0.4 MG/ML IJ SOLN
0.4000 mg | Freq: Once | INTRAMUSCULAR | Status: AC
  Administered 2017-11-06: 0.4 mg via INTRAVENOUS
  Filled 2017-11-06: qty 1

## 2017-11-06 MED ORDER — ETOMIDATE 2 MG/ML IV SOLN
INTRAVENOUS | Status: AC | PRN
  Administered 2017-11-06: 15 mg via INTRAVENOUS

## 2017-11-06 MED ORDER — FENTANYL CITRATE (PF) 100 MCG/2ML IJ SOLN
100.0000 ug | INTRAMUSCULAR | Status: DC | PRN
  Administered 2017-11-06 (×2): 100 ug via INTRAVENOUS

## 2017-11-06 MED ORDER — ASPIRIN 300 MG RE SUPP
300.0000 mg | RECTAL | Status: AC
  Administered 2017-11-06: 300 mg via RECTAL
  Filled 2017-11-06: qty 1

## 2017-11-06 MED ORDER — ROCURONIUM BROMIDE 50 MG/5ML IV SOLN
100.0000 mg | Freq: Once | INTRAVENOUS | Status: DC
  Filled 2017-11-06: qty 10

## 2017-11-06 MED ORDER — SODIUM CHLORIDE 0.9 % IV SOLN: 250.0000 mL | INTRAVENOUS | Status: DC | PRN

## 2017-11-06 MED ORDER — HEPARIN SODIUM (PORCINE) 5000 UNIT/ML IJ SOLN
5000.0000 [IU] | Freq: Three times a day (TID) | INTRAMUSCULAR | Status: DC
  Administered 2017-11-06 – 2017-11-10 (×12): 5000 [IU] via SUBCUTANEOUS
  Filled 2017-11-06 (×12): qty 1

## 2017-11-06 MED ORDER — ROCURONIUM BROMIDE 50 MG/5ML IV SOLN
INTRAVENOUS | Status: AC | PRN
  Administered 2017-11-06: 100 mg via INTRAVENOUS

## 2017-11-06 MED ORDER — SODIUM CHLORIDE 0.9 % IV BOLUS (SEPSIS): 1000.0000 mL | Freq: Once | INTRAVENOUS | Status: DC

## 2017-11-06 MED ORDER — PROPOFOL 1000 MG/100ML IV EMUL
0.0000 ug/kg/min | INTRAVENOUS | Status: DC
  Administered 2017-11-06: 30 ug/kg/min via INTRAVENOUS
  Administered 2017-11-06 – 2017-11-07 (×2): 40 ug/kg/min via INTRAVENOUS
  Administered 2017-11-07: 38.589 ug/kg/min via INTRAVENOUS
  Administered 2017-11-07 – 2017-11-08 (×3): 50 ug/kg/min via INTRAVENOUS
  Filled 2017-11-06: qty 200
  Filled 2017-11-06 (×3): qty 100

## 2017-11-06 MED ORDER — SODIUM CHLORIDE 0.9 % IV BOLUS (SEPSIS)
500.0000 mL | Freq: Once | INTRAVENOUS | Status: AC
  Administered 2017-11-06: 500 mL via INTRAVENOUS

## 2017-11-06 MED ORDER — FENTANYL CITRATE (PF) 100 MCG/2ML IJ SOLN
100.0000 ug | INTRAMUSCULAR | Status: DC | PRN
  Filled 2017-11-06 (×2): qty 2

## 2017-11-06 MED ORDER — ETOMIDATE 2 MG/ML IV SOLN: 15.0000 mg | Freq: Once | INTRAVENOUS | Status: DC

## 2017-11-06 MED ORDER — INSULIN ASPART 100 UNIT/ML ~~LOC~~ SOLN
2.0000 [IU] | SUBCUTANEOUS | Status: DC
  Administered 2017-11-06 (×2): 4 [IU] via SUBCUTANEOUS
  Administered 2017-11-06: 2 [IU] via SUBCUTANEOUS
  Administered 2017-11-07: 6 [IU] via SUBCUTANEOUS
  Administered 2017-11-07 (×3): 4 [IU] via SUBCUTANEOUS
  Administered 2017-11-07: 6 [IU] via SUBCUTANEOUS
  Filled 2017-11-06: qty 1

## 2017-11-06 MED ORDER — ORAL CARE MOUTH RINSE
15.0000 mL | Freq: Four times a day (QID) | OROMUCOSAL | Status: DC
  Administered 2017-11-06 – 2017-11-08 (×8): 15 mL via OROMUCOSAL

## 2017-11-06 MED ORDER — PROPOFOL 1000 MG/100ML IV EMUL
0.0000 ug/kg/min | INTRAVENOUS | Status: DC
  Administered 2017-11-06: 5 ug/kg/min via INTRAVENOUS
  Filled 2017-11-06 (×5): qty 100

## 2017-11-06 MED ORDER — CHLORHEXIDINE GLUCONATE 0.12% ORAL RINSE (MEDLINE KIT)
15.0000 mL | Freq: Two times a day (BID) | OROMUCOSAL | Status: DC
  Administered 2017-11-06 – 2017-11-08 (×4): 15 mL via OROMUCOSAL

## 2017-11-06 MED ORDER — MIDAZOLAM HCL 2 MG/2ML IJ SOLN
INTRAMUSCULAR | Status: AC
Start: 2017-11-06 — End: 2017-11-06
  Administered 2017-11-06: 4 mg via INTRAVENOUS
  Filled 2017-11-06: qty 8

## 2017-11-06 MED ORDER — MIDAZOLAM HCL 2 MG/2ML IJ SOLN
1.0000 mg | INTRAMUSCULAR | Status: DC | PRN
  Administered 2017-11-06 – 2017-11-07 (×2): 4 mg via INTRAVENOUS
  Filled 2017-11-06: qty 4

## 2017-11-06 MED ORDER — PANTOPRAZOLE SODIUM 40 MG PO PACK
40.0000 mg | PACK | Freq: Every day | ORAL | Status: DC
  Administered 2017-11-06 – 2017-11-08 (×3): 40 mg
  Filled 2017-11-06 (×4): qty 20

## 2017-11-06 MED ORDER — FENTANYL CITRATE (PF) 100 MCG/2ML IJ SOLN
100.0000 ug | INTRAMUSCULAR | Status: DC | PRN
  Filled 2017-11-06: qty 2

## 2017-11-06 MED ORDER — ASPIRIN 81 MG PO CHEW: 324.0000 mg | CHEWABLE_TABLET | ORAL | Status: AC

## 2017-11-06 NOTE — Code Documentation (Signed)
PA, RT and RN at bedside. Pt being bagged with ambu bag for preoxygenation. Preparing to intubate pt.
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
## 2017-11-06 NOTE — ED Notes (Signed)
EEG at the bedside  ?

## 2017-11-06 NOTE — ED Notes (Signed)
Attempted report x1. 

## 2017-11-06 NOTE — ED Notes (Signed)
Pt having periods of apnea. MD aware. MD to place NPA.
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
## 2017-11-06 NOTE — ED Notes (Signed)
Bair hugger temperature decreased to lowest setting

## 2017-11-06 NOTE — ED Notes (Signed)
Admitting at the bedside.  
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
## 2017-11-06 NOTE — Progress Notes (Signed)
Pt transported to CT1 by RT and RN on vent and returned back to B16. No complications.
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
## 2017-11-06 NOTE — ED Notes (Signed)
Bair hugger removed. Warm blankets placed on pt.

## 2017-11-06 NOTE — ED Notes (Signed)
Pt transported to CT with RN and RT

## 2017-11-06 NOTE — ED Notes (Signed)
Pt with some agitation. PA at bedside to check on pt. Will start propofol.

## 2017-11-06 NOTE — ED Notes (Signed)
ED Provider at bedside. 

## 2017-11-06 NOTE — Progress Notes (Signed)
STAT EEG completed; results pending. 

## 2017-11-07 ENCOUNTER — Inpatient Hospital Stay (HOSPITAL_COMMUNITY): Payer: Medicaid Other

## 2017-11-07 LAB — BLOOD GAS, ARTERIAL
Acid-Base Excess: 2.8 mmol/L — ABNORMAL HIGH (ref 0.0–2.0)
Bicarbonate: 25.2 mmol/L (ref 20.0–28.0)
Drawn by: 511551
FIO2: 40
MECHVT: 620 mL
O2 Saturation: 98.6 %
PEEP: 5 cmH2O
Patient temperature: 98.6
RATE: 18 resp/min
pCO2 arterial: 28.1 mmHg — ABNORMAL LOW (ref 32.0–48.0)
pH, Arterial: 7.561 — ABNORMAL HIGH (ref 7.350–7.450)
pO2, Arterial: 115 mmHg — ABNORMAL HIGH (ref 83.0–108.0)

## 2017-11-07 LAB — PHOSPHORUS
Phosphorus: 2.1 mg/dL — ABNORMAL LOW (ref 2.5–4.6)
Phosphorus: 2.3 mg/dL — ABNORMAL LOW (ref 2.5–4.6)
Phosphorus: 2.3 mg/dL — ABNORMAL LOW (ref 2.5–4.6)

## 2017-11-07 LAB — GLUCOSE, CAPILLARY
Glucose-Capillary: 206 mg/dL — ABNORMAL HIGH (ref 65–99)
Glucose-Capillary: 188 mg/dL — ABNORMAL HIGH (ref 65–99)
Glucose-Capillary: 161 mg/dL — ABNORMAL HIGH (ref 65–99)
Glucose-Capillary: 181 mg/dL — ABNORMAL HIGH (ref 65–99)
Glucose-Capillary: 229 mg/dL — ABNORMAL HIGH (ref 65–99)

## 2017-11-07 LAB — BASIC METABOLIC PANEL
Anion gap: 8 (ref 5–15)
BUN: 16 mg/dL (ref 6–20)
CO2: 24 mmol/L (ref 22–32)
Calcium: 8.4 mg/dL — ABNORMAL LOW (ref 8.9–10.3)
Chloride: 103 mmol/L (ref 101–111)
Creatinine, Ser: 1.41 mg/dL — ABNORMAL HIGH (ref 0.61–1.24)
GFR calc Af Amer: >60 mL/min (ref >60)
GFR calc non Af Amer: 54 mL/min — ABNORMAL LOW (ref >60)
Glucose, Bld: 204 mg/dL — ABNORMAL HIGH (ref 65–99)
Potassium: 2.8 mmol/L — ABNORMAL LOW (ref 3.5–5.1)
Sodium: 135 mmol/L (ref 135–145)

## 2017-11-07 LAB — MAGNESIUM
Magnesium: 1.8 mg/dL (ref 1.7–2.4)
Magnesium: 1.9 mg/dL (ref 1.7–2.4)
Magnesium: 2.1 mg/dL (ref 1.7–2.4)

## 2017-11-07 LAB — CBC
HCT: 36.8 % — ABNORMAL LOW (ref 39.0–52.0)
Hemoglobin: 12.8 g/dL — ABNORMAL LOW (ref 13.0–17.0)
MCH: 32.5 pg (ref 26.0–34.0)
MCHC: 34.8 g/dL (ref 30.0–36.0)
MCV: 93.4 fL (ref 78.0–100.0)
Platelets: 210 10*3/uL (ref 150–400)
RBC: 3.94 MIL/uL — ABNORMAL LOW (ref 4.22–5.81)
RDW: 12.2 % (ref 11.5–15.5)
WBC: 8.7 10*3/uL (ref 4.0–10.5)

## 2017-11-07 LAB — POTASSIUM: Potassium: 3.6 mmol/L (ref 3.5–5.1)

## 2017-11-07 LAB — PROCALCITONIN: Procalcitonin: <0.1 ng/mL

## 2017-11-07 MED ORDER — VITAL HIGH PROTEIN PO LIQD
1000.0000 mL | ORAL | Status: DC
  Administered 2017-11-07: 1000 mL

## 2017-11-07 MED ORDER — PRO-STAT SUGAR FREE PO LIQD
30.0000 mL | Freq: Two times a day (BID) | ORAL | Status: DC
  Filled 2017-11-07: qty 30

## 2017-11-07 MED ORDER — LACTULOSE 10 GM/15ML PO SOLN
30.0000 g | Freq: Two times a day (BID) | ORAL | Status: DC
  Administered 2017-11-07 – 2017-11-08 (×3): 30 g
  Filled 2017-11-07 (×3): qty 45

## 2017-11-07 MED ORDER — POTASSIUM CHLORIDE 10 MEQ/100ML IV SOLN
10.0000 meq | INTRAVENOUS | Status: DC
  Administered 2017-11-07 (×4): 10 meq via INTRAVENOUS
  Filled 2017-11-07 (×3): qty 100

## 2017-11-07 MED ORDER — INSULIN ASPART 100 UNIT/ML ~~LOC~~ SOLN
0.0000 [IU] | SUBCUTANEOUS | Status: DC
  Administered 2017-11-08: 5 [IU] via SUBCUTANEOUS
  Administered 2017-11-08: 3 [IU] via SUBCUTANEOUS
  Administered 2017-11-08 (×2): 5 [IU] via SUBCUTANEOUS
  Administered 2017-11-09: 3 [IU] via SUBCUTANEOUS
  Administered 2017-11-09 (×2): 8 [IU] via SUBCUTANEOUS

## 2017-11-07 MED ORDER — POTASSIUM CHLORIDE 10 MEQ/100ML IV SOLN
INTRAVENOUS | Status: AC
  Administered 2017-11-07 (×2): 10 meq
  Filled 2017-11-07: qty 100

## 2017-11-07 MED ORDER — POTASSIUM CHLORIDE 20 MEQ/15ML (10%) PO SOLN
40.0000 meq | Freq: Once | ORAL | Status: AC
  Administered 2017-11-07: 40 meq
  Filled 2017-11-07: qty 30

## 2017-11-07 MED ORDER — GADOBENATE DIMEGLUMINE 529 MG/ML IV SOLN
20.0000 mL | Freq: Once | INTRAVENOUS | Status: AC | PRN
  Administered 2017-11-07: 20 mL via INTRAVENOUS

## 2017-11-07 MED ORDER — VITAL HIGH PROTEIN PO LIQD: 1000.0000 mL | ORAL | Status: DC

## 2017-11-07 NOTE — Progress Notes (Signed)
PULMONARY / CRITICAL CARE MEDICINE   Name: Brad [name] MRN: [id number] DOB: 1961-07-20    ADMISSION DATE:  11/06/2017 CONSULTATION DATE:  11/06/2017  REFERRING MD:  EDP - Issacson  CHIEF COMPLAINT:  AMS and acute respiratory failure  HISTORY OF PRESENT ILLNESS:   56 yo male smoker from homeless shelter with altered mental status.  Intubated for airway protection.  Hx of polysubstance abuse, A fib, diastolic CHF, CKD 3, depression, DM, GERD, Hep C, HLD, HTN, neuropathy.  SUBJECTIVE:  Sedated.  VITAL SIGNS: BP (!) 139/94   Pulse 63   Temp (!) 97.5 F (36.4 C) (Oral)   Resp 17   Ht 6\' 1"  (1.854 m)   Wt 199 lb 15.3 oz (90.7 kg)   SpO2 100%   BMI 26.38 kg/m   VENTILATOR SETTINGS: Vent Mode: PRVC FiO2 (%):  [40 %-100 %] 40 % Set Rate:  [18 bmp] 18 bmp Vt Set:  [500 mL-620 mL] 620 mL PEEP:  [5 cmH20] 5 cmH20 Plateau Pressure:  [19 cmH20-26 cmH20] 20 cmH20  INTAKE / OUTPUT: I/O last 3 completed shifts: In: 1103.8 [I.V.:273.8; NG/GT:30; IV Piggyback:800] Out: 2330 [Urine:2330]  PHYSICAL EXAMINATION:  General - sedated Eyes - pupils reactive ENT - ETT in place Cardiac - regular, no murmur Chest - no wheeze, rales Abd - soft, non tender Ext - 1+ edema Skin - no rashes Neuro - RASS -3   LABS:  BMET Recent Labs  Lab 11/06/17 0635 11/06/17 1245 11/07/17 0303  NA 136 136 135  K 3.5 3.2* 2.8*  CL 98* 103 103  CO2 29 26 24   BUN 15 15 16   CREATININE 1.58* 1.40* 1.41*  GLUCOSE 237* 194* 204*    Electrolytes Recent Labs  Lab 11/06/17 0635 11/06/17 1245 11/07/17 0303  CALCIUM 9.0 8.3* 8.4*  MG  --  1.6* 1.8  PHOS  --  1.8* 2.1*    CBC Recent Labs  Lab 11/06/17 0635 11/06/17 1245 11/07/17 0303  WBC 7.1 6.8 8.7  HGB 13.6 12.4* 12.8*  HCT 39.7 36.1* 36.8*  PLT 216 215 210    Coag's Recent Labs  Lab 11/06/17 1245  APTT 30  INR 1.19    Sepsis Markers Recent Labs  Lab 11/06/17 0732 11/06/17 1118 11/06/17 1245 11/07/17 0303   LATICACIDVEN 2.61* 1.11  --   --   PROCALCITON  --   --  <0.10 <0.10    ABG Recent Labs  Lab 11/06/17 0749 11/06/17 1216 11/07/17 0435  PHART 7.430 7.561* 7.561*  PCO2ART 45.2 29.4* 28.1*  PO2ART 91.0 70.0* 115*    Liver Enzymes Recent Labs  Lab 11/06/17 0635 11/06/17 1245  AST 37 33  ALT 29 24  ALKPHOS 84 66  BILITOT 0.5 0.6  ALBUMIN 3.3* 2.7*    Cardiac Enzymes No results for input(s): TROPONINI, PROBNP in the last 168 hours.  Glucose Recent Labs  Lab 11/06/17 0753 11/06/17 1420 11/06/17 1638 11/06/17 2341 11/07/17 0312 11/07/17 0753  GLUCAP 268* 175* 126* 198* 206* 188*    Imaging Ct Head Wo Contrast  Result Date: 11/06/2017 CLINICAL DATA:  Found down this morning. Complained of severe neck pain on Monday. EXAM: CT HEAD WITHOUT CONTRAST CT CERVICAL SPINE WITHOUT CONTRAST TECHNIQUE: Multidetector CT imaging of the head and cervical spine was performed following the standard protocol without intravenous contrast. Multiplanar CT image reconstructions of the cervical spine were also generated. COMPARISON:  Previous exams including head CT dated 09/18/2016 and CTA neck dated 11/02/2017. FINDINGS: CT HEAD FINDINGS Brain:  Ventricles are stable in size and configuration. Mild chronic small vessel ischemic changes noted within the bilateral periventricular white matter regions. There is no mass, hemorrhage, edema or other evidence of acute parenchymal abnormality. No extra-axial hemorrhage. Vascular: There are chronic calcified atherosclerotic changes of the large vessels at the skull base. No unexpected hyperdense vessel. Skull: Normal. Negative for fracture or focal lesion. Sinuses/Orbits: No acute finding. Other: None. CT CERVICAL SPINE FINDINGS Alignment: Mild straightening of the normal cervical spine lordosis which is likely related to patient positioning. No evidence of acute vertebral body subluxation. Skull base and vertebrae: No fracture line or displaced fracture  fragment identified. Soft tissues and spinal canal: No visible canal hematoma. Endotracheal tube and enteric tube in place limiting characterization of the prevertebral soft tissues. Disc levels: Degenerative spurring within the mid cervical spine, with associated disc-osteophytic bulges at the C4-5 and C5-6 levels causing moderate to severe central canal stenoses and neural foramen stenoses with possible associated nerve root impingement. Upper chest: No acute findings. Other: Bilateral carotid atherosclerosis. IMPRESSION: 1. No acute intracranial abnormality. No intracranial mass, hemorrhage or edema. No skull fracture. Mild chronic small vessel ischemic change in the white matter. 2. No fracture or acute subluxation within the cervical spine. 3. Degenerative changes within the mid cervical spine, with associated disc-osteophytic bulges at C4-5 and C5-6 causing moderate to severe central canal stenoses with probable associated nerve root impingement. 4. Carotid atherosclerosis. Electronically Signed   By: Franki Cabot M.D.   On: 11/06/2017 10:13   Ct Cervical Spine Wo Contrast  Result Date: 11/06/2017 CLINICAL DATA:  Found down this morning. Complained of severe neck pain on Monday. EXAM: CT HEAD WITHOUT CONTRAST CT CERVICAL SPINE WITHOUT CONTRAST TECHNIQUE: Multidetector CT imaging of the head and cervical spine was performed following the standard protocol without intravenous contrast. Multiplanar CT image reconstructions of the cervical spine were also generated. COMPARISON:  Previous exams including head CT dated 09/18/2016 and CTA neck dated 11/02/2017. FINDINGS: CT HEAD FINDINGS Brain: Ventricles are stable in size and configuration. Mild chronic small vessel ischemic changes noted within the bilateral periventricular white matter regions. There is no mass, hemorrhage, edema or other evidence of acute parenchymal abnormality. No extra-axial hemorrhage. Vascular: There are chronic calcified  atherosclerotic changes of the large vessels at the skull base. No unexpected hyperdense vessel. Skull: Normal. Negative for fracture or focal lesion. Sinuses/Orbits: No acute finding. Other: None. CT CERVICAL SPINE FINDINGS Alignment: Mild straightening of the normal cervical spine lordosis which is likely related to patient positioning. No evidence of acute vertebral body subluxation. Skull base and vertebrae: No fracture line or displaced fracture fragment identified. Soft tissues and spinal canal: No visible canal hematoma. Endotracheal tube and enteric tube in place limiting characterization of the prevertebral soft tissues. Disc levels: Degenerative spurring within the mid cervical spine, with associated disc-osteophytic bulges at the C4-5 and C5-6 levels causing moderate to severe central canal stenoses and neural foramen stenoses with possible associated nerve root impingement. Upper chest: No acute findings. Other: Bilateral carotid atherosclerosis. IMPRESSION: 1. No acute intracranial abnormality. No intracranial mass, hemorrhage or edema. No skull fracture. Mild chronic small vessel ischemic change in the white matter. 2. No fracture or acute subluxation within the cervical spine. 3. Degenerative changes within the mid cervical spine, with associated disc-osteophytic bulges at C4-5 and C5-6 causing moderate to severe central canal stenoses with probable associated nerve root impingement. 4. Carotid atherosclerosis. Electronically Signed   By: Franki Cabot M.D.   On:  11/06/2017 10:13   Mr Jeri Cos GL Contrast  Result Date: 11/07/2017 CLINICAL DATA:  Initial evaluation for acute altered mental status. EXAM: MRI HEAD WITHOUT AND WITH CONTRAST TECHNIQUE: Multiplanar, multiecho pulse sequences of the brain and surrounding structures were obtained without and with intravenous contrast. CONTRAST:  16mL MULTIHANCE GADOBENATE DIMEGLUMINE 529 MG/ML IV SOLN COMPARISON:  [facility] CT from 11/06/2017. FINDINGS:  Brain: Mild cerebral volume loss with chronic small vessel ischemic disease, stable from previous. No abnormal foci of restricted diffusion to suggest acute or subacute ischemia. Gray-white matter differentiation maintained. No evidence for chronic infarction. No evidence for acute or chronic intracranial hemorrhage. No mass lesion, midline shift or mass effect. No hydrocephalus. No extra-axial fluid collection. Major dural sinuses grossly patent. No abnormal enhancement. Subcentimeter T1 hyperintense lesion at the superior aspect of the pituitary gland, possibly a small proteinaceous and/or hemorrhagic cyst, stable from previous, and of doubtful significance. Vascular: Major intravascular flow voids are maintained. Skull and upper cervical spine: Craniocervical junction within normal limits. Mild scattered degenerative spondylolysis noted within the upper cervical spine. Probable mild to moderate stenosis at C4-5. Bone marrow signal intensity within normal limits. No scalp soft tissue abnormality. Sinuses/Orbits: Globes oral soft tissues within normal limits. Scattered mucosal thickening throughout the paranasal sinuses. Small fluid levels noted within the sphenoid sinuses. Fluid seen layering within the nasopharynx. Patient is intubated. Trace bilateral mastoid effusions. Inner ear structures normal. Other: None. IMPRESSION: 1. No acute intracranial abnormality. 2. Mild parenchymal volume loss with chronic small vessel ischemic disease, stable. Electronically Signed   By: Jeannine Boga M.D.   On: 11/07/2017 02:18   Dg Chest Port 1 View  Result Date: 11/07/2017 CLINICAL DATA:  Acute respiratory failure with hypoxia. EXAM: PORTABLE CHEST 1 VIEW COMPARISON:  One-view chest x-ray 11/06/2017. FINDINGS: Endotracheal tube is stable, 6.5 cm above the carina. The side port of the NG tube is at the GE junction. The heart is enlarged. There is no significant edema. Small effusions are suspected. Fluid or  atelectasis is present along the minor fissure. Mild bibasilar atelectasis is present. No other significant airspace consolidation is present. IMPRESSION: 1. Support apparatus is stable. 2. Increasing small bilateral pleural effusions and associated atelectasis. Electronically Signed   By: San Morelle M.D.   On: 11/07/2017 07:33   Dg Chest Portable 1 View  Result Date: 11/06/2017 CLINICAL DATA:  Intubation.  Nasogastric tube placement at bedside. EXAM: PORTABLE CHEST 1 VIEW 9:16 a.m.: COMPARISON:  Portable chest x-ray earlier today 7:30 a.m. FINDINGS: Endotracheal tube tip in satisfactory position projecting approximately 5 cm above the carina. Nasogastric tube courses below the diaphragm into the stomach though its tip is not included on the image. Cardiac silhouette mildly to moderately enlarged. Mild pulmonary venous hypertension without overt edema. Mild atelectasis in the lung bases and in the lingula, progressive since earlier this morning. No confluent airspace consolidation. IMPRESSION: 1. Support apparatus satisfactory. Endotracheal tube tip projects approximately 5 cm above the carina. Nasogastric tube courses into the stomach. 2. Mild atelectasis involving both lung bases and the lingula, progressive since earlier this morning. No acute cardiopulmonary disease otherwise. 3. Cardiomegaly.  Pulmonary venous hypertension without overt edema. Electronically Signed   By: Evangeline Dakin M.D.   On: 11/06/2017 09:33     STUDIES:  Brain MRI 11/16>>> chronic small vessel ischemic changes EEG 11/16 >>> generalized irregular delta activity  CULTURES: Blood 11/16>>> Urine 11/16>>> Sputum 11/16>>>  ANTIBIOTICS: None  SIGNIFICANT EVENTS: 11/16 intubated, neuro consulted   LINES/TUBES:  ETT 11/16>>>  DISCUSSION: 56 yo with altered mental status and compromised airway with hx of polysubstance abuse.  Has elevated ammonia and UDS positive for opiates.  Hx of hepatitis C with previous  imaging showing changes of cirrhosis.  ASSESSMENT / PLAN:  Acute respiratory failure with compromised airway. - full vent support - f/u CXR, ABG  Relative adrenal insufficiency >> cortisol 2.6 from 11/16. - continue solu cortef  Acute metabolic encephalopathy. - likely from opiates and elevated ammonia - f/u with neuro  Hx of hepatitis C with cirrhosis and elevated ammonia. - add lactulose - f/u LFTs  Hx of HTN with chronic diastolic CHF, HLD, A flutter. - hold outpt norvasc, lipitor, coreg, catapres, eliquis  CKD 3. Hypokalemia. - replace electrolytes as needed  DM type II with neuropathy. - SSI - hold outpt neurontin, elavil  DVT prophylaxis - SQ heparin SUP - protonix  Nutrition - tube feeds Goals of care - full code  CC time 32 minutes  D/w Dr. Orinda Kenner, MD [place] 11/07/2017, 8:36 AM Pager:  [phone] After 3pm call: [phone]

## 2017-11-07 NOTE — Progress Notes (Signed)
[place] Progress Note Patient Name: Brad [name] DOB: 1961/11/25 MRN: [id number]   Date of Service  11/07/2017  HPI/Events of Note  Blood glucose = 229.  eICU Interventions  Will change to Q 4 hour moderate Novolog SSI.      Intervention Category Major Interventions: Hyperglycemia - active titration of insulin therapy  Lysle Dingwall 11/07/2017, 9:29 PM

## 2017-11-07 NOTE — Progress Notes (Signed)
Transported Pt from 43M 08 to MRI and back without any complications.

## 2017-11-07 NOTE — Progress Notes (Signed)
[place] Progress Note Patient Name: Brad [name] DOB: 09-26-1961 MRN: [id number]   Date of Service  11/07/2017  HPI/Events of Note  Potassium 2.8. Creatinine 1.4. Enteric feeding tube and peripheral IVs in place.   eICU Interventions  1. KCl 40 mEq via tube 1 2. KCl 10 mEq IV 4 runs      Intervention Category Major Interventions: Electrolyte abnormality - evaluation and management  Tera Partridge 11/07/2017, 4:28 AM

## 2017-11-07 NOTE — Progress Notes (Signed)
Subjective: Patient appears more awake (though was sedated yesterday when I saw him)  Exam: Vitals:   11/07/17 0752 11/07/17 0811  BP:  (!) 139/94  Pulse:    Resp:  17  Temp: (!) 97.5 F (36.4 C)   SpO2:     Gen: In bed, intubated Resp: ventilated Abd: soft, nt  Neuro: MS: Awakens to noxious stimuli, does not follow commands, but does orient to voice.  YE:MVVKP, eyes slightly dysconjugate, but both cross midline with doll's eye Motor: withdraws to noxious stimulation x 4.  Sensory:as above.   Pertinent Labs: Cr 1.4 ABG on arrival 45 Ammonia on arrival 65  MRI negative, EEG negative.   Impression: 56 yo M found down with unclear etiology. He does have a history of hep C and cirrhosis, ? Hepatic encephalopathy. Medication effect could be playing a role as well with him being on multiple sedating medications (baclofen, gabapentin, elavil, clonidine).   Recommendations: 1) Would continue to wean sedation and consider treatment for hepatic encephalopathy.  2) will follow.   Roland Rack, MD [facility] [phone]  If 7pm- 7am, please page neurology on call as listed in [place].

## 2017-11-07 NOTE — Progress Notes (Signed)
Initial Nutrition Assessment  DOCUMENTATION CODES:  Not applicable  INTERVENTION:  Initiate TF via OGT with VHP at goal rate of 65 ml/h (1560 ml per day) and to provide 1560 kcals +554 kcals/day from diprivan, 137 gm protein, 1304 ml free water daily.  NUTRITION DIAGNOSIS:  Inadequate oral intake related to inability to eat as evidenced by NPO status.  GOAL:  Patient will meet greater than or equal to 90% of their needs  MONITOR:  Diet advancement, Vent status, Labs, TF tolerance  REASON FOR ASSESSMENT:  Ventilator, Consult Enteral/tube feeding initiation and management  ASSESSMENT:  56 y/o male PMHx Polysubstance abuse, Afib, CHF, CKD3, Depression, DM, GERD, Hep C, HLD/HTN, tobacco abuse. [facility] unresponsive at homeless shelter. Brought to ED and intubated for airway protection. Question of seizures vs overdose with UDS + opiates.  Pt intubated, sedated. No one present to provide any history.   Physical Exam is WDL. Abdomen is soft. Noted high rate propofol   Patient is currently intubated on ventilator support MV: 10.5 L/min Temp (24hrs), Avg:99 F (37.2 C), Min:97.5 F (36.4 C), Max:99.8 F (37.7 C) Propofol: 80ml/hr-> Provides: 554 kcals/day  Labs: BUN/Creat: 16/1.41, K: 2.8, Phos: 2.1, Mag 1.8, BGs: 190-210, Albumin: 2.7,  Meds :  Solu cortef, Lactulose, PPI,   Recent Labs  Lab 11/06/17 0635 11/06/17 1245 11/07/17 0303  NA 136 136 135  K 3.5 3.2* 2.8*  CL 98* 103 103  CO2 29 26 24   BUN 15 15 16   CREATININE 1.58* 1.40* 1.41*  CALCIUM 9.0 8.3* 8.4*  MG  --  1.6* 1.8  PHOS  --  1.8* 2.1*  GLUCOSE 237* 194* 204*    NUTRITION - FOCUSED PHYSICAL EXAM:   Most Recent Value  Orbital Region  No depletion  Upper Arm Region  No depletion  Thoracic and Lumbar Region  No depletion  Buccal Region  No depletion  Temple Region  No depletion  Clavicle Bone Region  No depletion  Clavicle and Acromion Bone Region  No depletion  Scapular Bone Region  No depletion   Dorsal Hand  No depletion  Patellar Region  No depletion  Anterior Thigh Region  No depletion  Posterior Calf Region  No depletion      Diet Order:  Diet NPO time specified  EDUCATION NEEDS:  No education needs have been identified at this time  Skin:  Skin Assessment: Reviewed RN Assessment  Last BM:  Unknown  Height:  Ht Readings from Last 1 Encounters:  11/06/17 6\' 1"  (1.854 m)   Weight:  Wt Readings from Last 1 Encounters:  11/07/17 199 lb 15.3 oz (90.7 kg)   Wt Readings from Last 10 Encounters:  11/07/17 199 lb 15.3 oz (90.7 kg)  11/02/17 200 lb (90.7 kg)  10/07/17 199 lb 6.4 oz (90.4 kg)  10/02/17 240 lb (108.9 kg)  09/01/17 201 lb 6.4 oz (91.4 kg)  08/07/17 201 lb 8 oz (91.4 kg)  07/08/17 192 lb 14.4 oz (87.5 kg)  05/08/17 206 lb (93.4 kg)  10/16/16 202 lb 3.2 oz (91.7 kg)  10/06/16 198 lb (89.8 kg)   Ideal Body Weight:  80.91 kg  BMI:  Body mass index is 26.38 kg/m.  Estimated Nutritional Needs:  Kcal:  2130 kcals Protein:  127-145 g (1.4-1.6g/kg bw) Fluid:  Per md  Burtis Junes RD, LDN, CNSC Clinical Nutrition Pager: [phone] 11/07/2017 11:19 AM

## 2017-11-08 ENCOUNTER — Inpatient Hospital Stay (HOSPITAL_COMMUNITY): Payer: Medicaid Other

## 2017-11-08 LAB — GLUCOSE, CAPILLARY
Glucose-Capillary: 114 mg/dL — ABNORMAL HIGH (ref 65–99)
Glucose-Capillary: 195 mg/dL — ABNORMAL HIGH (ref 65–99)
Glucose-Capillary: 205 mg/dL — ABNORMAL HIGH (ref 65–99)
Glucose-Capillary: 220 mg/dL — ABNORMAL HIGH (ref 65–99)
Glucose-Capillary: 250 mg/dL — ABNORMAL HIGH (ref 65–99)
Glucose-Capillary: 83 mg/dL (ref 65–99)

## 2017-11-08 LAB — CBC
HCT: 38.8 % — ABNORMAL LOW (ref 39.0–52.0)
Hemoglobin: 13.2 g/dL (ref 13.0–17.0)
MCH: 32.3 pg (ref 26.0–34.0)
MCHC: 34 g/dL (ref 30.0–36.0)
MCV: 94.9 fL (ref 78.0–100.0)
Platelets: 180 10*3/uL (ref 150–400)
RBC: 4.09 MIL/uL — ABNORMAL LOW (ref 4.22–5.81)
RDW: 12.7 % (ref 11.5–15.5)
WBC: 9.7 10*3/uL (ref 4.0–10.5)

## 2017-11-08 LAB — COMPREHENSIVE METABOLIC PANEL
ALT: 19 U/L (ref 17–63)
AST: 27 U/L (ref 15–41)
Albumin: 2.8 g/dL — ABNORMAL LOW (ref 3.5–5.0)
Alkaline Phosphatase: 61 U/L (ref 38–126)
Anion gap: 7 (ref 5–15)
BUN: 19 mg/dL (ref 6–20)
CO2: 25 mmol/L (ref 22–32)
Calcium: 8.4 mg/dL — ABNORMAL LOW (ref 8.9–10.3)
Chloride: 106 mmol/L (ref 101–111)
Creatinine, Ser: 1.39 mg/dL — ABNORMAL HIGH (ref 0.61–1.24)
GFR calc Af Amer: >60 mL/min (ref >60)
GFR calc non Af Amer: 55 mL/min — ABNORMAL LOW (ref >60)
Glucose, Bld: 221 mg/dL — ABNORMAL HIGH (ref 65–99)
Potassium: 3.4 mmol/L — ABNORMAL LOW (ref 3.5–5.1)
Sodium: 138 mmol/L (ref 135–145)
Total Bilirubin: 0.5 mg/dL (ref 0.3–1.2)
Total Protein: 7.7 g/dL (ref 6.5–8.1)

## 2017-11-08 LAB — PROCALCITONIN: Procalcitonin: <0.1 ng/mL

## 2017-11-08 LAB — MAGNESIUM: Magnesium: 2 mg/dL (ref 1.7–2.4)

## 2017-11-08 LAB — URINE CULTURE: Culture: NO GROWTH

## 2017-11-08 LAB — PHOSPHORUS: Phosphorus: 2.2 mg/dL — ABNORMAL LOW (ref 2.5–4.6)

## 2017-11-08 MED ORDER — CLONIDINE HCL 0.1 MG PO TABS
0.1000 mg | ORAL_TABLET | Freq: Two times a day (BID) | ORAL | Status: DC
  Administered 2017-11-08 – 2017-11-11 (×7): 0.1 mg via ORAL
  Filled 2017-11-08 (×7): qty 1

## 2017-11-08 MED ORDER — SODIUM CHLORIDE 0.9 % IV SOLN
INTRAVENOUS | Status: DC
  Administered 2017-11-08: 16:00:00 via INTRAVENOUS

## 2017-11-08 MED ORDER — PANTOPRAZOLE SODIUM 40 MG IV SOLR
40.0000 mg | INTRAVENOUS | Status: DC
  Administered 2017-11-09: 40 mg via INTRAVENOUS
  Filled 2017-11-08: qty 40

## 2017-11-08 MED ORDER — LACTULOSE 10 GM/15ML PO SOLN
30.0000 g | Freq: Two times a day (BID) | ORAL | Status: DC
  Administered 2017-11-08 – 2017-11-10 (×4): 30 g via ORAL
  Filled 2017-11-08 (×4): qty 45

## 2017-11-08 MED ORDER — POTASSIUM CHLORIDE 20 MEQ/15ML (10%) PO SOLN
40.0000 meq | Freq: Once | ORAL | Status: AC
  Administered 2017-11-08: 40 meq

## 2017-11-08 MED ORDER — CARVEDILOL 12.5 MG PO TABS
12.5000 mg | ORAL_TABLET | Freq: Two times a day (BID) | ORAL | Status: DC
  Administered 2017-11-08 – 2017-11-09 (×3): 12.5 mg via ORAL
  Filled 2017-11-08 (×3): qty 1

## 2017-11-08 MED ORDER — AMLODIPINE BESYLATE 10 MG PO TABS
10.0000 mg | ORAL_TABLET | Freq: Every day | ORAL | Status: DC
  Administered 2017-11-08 – 2017-11-11 (×4): 10 mg via ORAL
  Filled 2017-11-08 (×4): qty 1

## 2017-11-08 MED ORDER — ATORVASTATIN CALCIUM 40 MG PO TABS
40.0000 mg | ORAL_TABLET | Freq: Every day | ORAL | Status: DC
Start: 2017-11-08 — End: 2017-11-11
  Administered 2017-11-08 – 2017-11-10 (×3): 40 mg via ORAL
  Filled 2017-11-08 (×4): qty 1

## 2017-11-08 MED ORDER — LABETALOL HCL 5 MG/ML IV SOLN
10.0000 mg | INTRAVENOUS | Status: DC | PRN
  Administered 2017-11-09: 10 mg via INTRAVENOUS
  Filled 2017-11-08: qty 4

## 2017-11-08 NOTE — Progress Notes (Signed)
PULMONARY / CRITICAL CARE MEDICINE   Name: Brad [name] MRN: [id number] DOB: 05-22-1961    ADMISSION DATE:  11/06/2017 CONSULTATION DATE:  11/06/2017  REFERRING MD:  EDP - Issacson  CHIEF COMPLAINT:  AMS and acute respiratory failure  HISTORY OF PRESENT ILLNESS:   56 yo male smoker from homeless shelter with altered mental status.  Intubated for airway protection.  Hx of polysubstance abuse, A fib, diastolic CHF, CKD 3, depression, DM, GERD, Hep C, HLD, HTN, neuropathy.  SUBJECTIVE:  Tolerating pressure support.  VITAL SIGNS: BP (!) 180/104   Pulse 77   Temp (!) 96.4 F (35.8 C)   Resp 17   Ht 6\' 1"  (1.854 m)   Wt 201 lb 8 oz (91.4 kg)   SpO2 100%   BMI 26.58 kg/m   VENTILATOR SETTINGS: Vent Mode: PRVC FiO2 (%):  [40 %] 40 % Set Rate:  [16 bmp] 16 bmp Vt Set:  [620 mL] 620 mL PEEP:  [5 cmH20] 5 cmH20 Plateau Pressure:  [18 cmH20-21 cmH20] 19 cmH20  INTAKE / OUTPUT: I/O last 3 completed shifts: In: 2870.9 [I.V.:1275.9; NG/GT:1295; IV Piggyback:300] Out: 8099 [IPJAS:5053]  PHYSICAL EXAMINATION:  General - alert Eyes - pupils reactive ENT - ETT in place Cardiac - regular, no murmur Chest - no wheeze, rales Abd - soft, non tender Ext - no edema Skin - no rashes Neuro - follows commands, moves all extremities    LABS:  BMET Recent Labs  Lab 11/06/17 1245 11/07/17 0303 11/07/17 1811 11/08/17 0432  NA 136 135  --  138  K 3.2* 2.8* 3.6 3.4*  CL 103 103  --  106  CO2 26 24  --  25  BUN 15 16  --  19  CREATININE 1.40* 1.41*  --  1.39*  GLUCOSE 194* 204*  --  221*    Electrolytes Recent Labs  Lab 11/06/17 1245 11/07/17 0303 11/07/17 1219 11/07/17 1801 11/08/17 0432  CALCIUM 8.3* 8.4*  --   --  8.4*  MG 1.6* 1.8 1.9 2.1 2.0  PHOS 1.8* 2.1* 2.3* 2.3* 2.2*    CBC Recent Labs  Lab 11/06/17 1245 11/07/17 0303 11/08/17 0432  WBC 6.8 8.7 9.7  HGB 12.4* 12.8* 13.2  HCT 36.1* 36.8* 38.8*  PLT 215 210 180    Coag's Recent Labs  Lab  11/06/17 1245  APTT 30  INR 1.19    Sepsis Markers Recent Labs  Lab 11/06/17 0732 11/06/17 1118 11/06/17 1245 11/07/17 0303 11/08/17 0432  LATICACIDVEN 2.61* 1.11  --   --   --   PROCALCITON  --   --  <0.10 <0.10 <0.10    ABG Recent Labs  Lab 11/06/17 0749 11/06/17 1216 11/07/17 0435  PHART 7.430 7.561* 7.561*  PCO2ART 45.2 29.4* 28.1*  PO2ART 91.0 70.0* 115*    Liver Enzymes Recent Labs  Lab 11/06/17 0635 11/06/17 1245 11/08/17 0432  AST 37 33 27  ALT 29 24 19   ALKPHOS 84 66 61  BILITOT 0.5 0.6 0.5  ALBUMIN 3.3* 2.7* 2.8*    Cardiac Enzymes No results for input(s): TROPONINI, PROBNP in the last 168 hours.  Glucose Recent Labs  Lab 11/07/17 1151 11/07/17 1615 11/07/17 2006 11/08/17 0003 11/08/17 0359 11/08/17 0809  GLUCAP 161* 181* 229* 205* 220* 195*    Imaging Dg Chest Port 1 View  Result Date: 11/08/2017 CLINICAL DATA:  Respiratory failure, routine EXAM: PORTABLE CHEST 1 VIEW COMPARISON:  None. FINDINGS: Endotracheal tube is stable at 5 cm above the carina.  NG tube courses off the inferior border the film. Mild pulmonary vascular congestion is stable. Pleural fluid or atelectasis along the minor fissure has resolved. A small left pleural effusion and atelectasis remain. Overall aeration is slightly improved. IMPRESSION: 1. Support apparatus is stable. 2. Overall slightly improved aeration. Electronically Signed   By: San Morelle M.D.   On: 11/08/2017 08:14     STUDIES:  Brain MRI 11/16>>> chronic small vessel ischemic changes EEG 11/16 >>> generalized irregular delta activity  CULTURES: Blood 11/16>>> Urine 11/16>>> Sputum 11/16>>>  ANTIBIOTICS: None  SIGNIFICANT EVENTS: 11/16 intubated, neuro consulted   LINES/TUBES: ETT 11/16>>>11/18  DISCUSSION: 56 yo with altered mental status and compromised airway with hx of polysubstance abuse.  Has elevated ammonia and UDS positive for opiates.  Hx of hepatitis C with previous  imaging showing changes of cirrhosis.  ASSESSMENT / PLAN:  Acute respiratory failure with compromised airway. - proceed with extubation 11/18  Relative adrenal insufficiency >> cortisol 2.6 from 11/16. - continue solu cortef  Acute metabolic encephalopathy. - likely from meds and elevated ammonia - improved 11/18  Hx of hepatitis C with cirrhosis and elevated ammonia. - continue lactulose  Dysphagia. - speech therapy after extubation  Hx of HTN with chronic diastolic CHF, HLD, A flutter. - prn labetalol IV - hold outpt norvasc, lipitor, coreg, catapres, eliquis until able to swallow pills  CKD 3. Hypokalemia. - replace electrolytes as needed  DM type II with neuropathy. - SSI - hold outpt neurontin, elavil, baclofen  DVT prophylaxis - SQ heparin SUP - protonix  Nutrition - NPO Goals of care - full code  CC time 31 minutes  D/w Dr. Orinda Kenner, MD [place] 11/08/2017, 10:04 AM Pager:  [phone] After 3pm call: [phone]

## 2017-11-08 NOTE — Progress Notes (Signed)
Subjective: No significant changes  Exam: Vitals:   11/08/17 0810 11/08/17 0859  BP:  (!) 169/99  Pulse:  79  Resp:  18  Temp: (!) 97.2 F (36.2 C)   SpO2:  99%   Gen: In bed, intubated Resp: ventilated Abd: soft, nt  Neuro: MS: Awakens to noxious stimuli, does not follow commands, but does orient to voice.  GQ:QPYPP, eyes are midline, he does not voluntarily look to either side but both cross midline with doll's eye Motor: withdraws to noxious stimulation x 4.  Sensory:as above.   Pertinent Labs:   MRI negative, EEG negative.   Impression: 56 yo M found down with unclear etiology. He does have a history of hep C and cirrhosis, ? Hepatic encephalopathy. Medication effect could be playing a role as well with him being on multiple sedating medications (baclofen, gabapentin, elavil, clonidine).   I do wonder if with his hepatic insufficiency, he may be slower in processing propofol, etc.  Recommendations: 1) Would continue to wean sedation and consider treatment for hepatic encephalopathy.  2) will follow.   Roland Rack, MD [facility] [phone]  If 7pm- 7am, please page neurology on call as listed in [place].
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
## 2017-11-08 NOTE — Procedures (Signed)
Extubation Procedure Note  Patient Details:   Name: Brad [name] DOB: 1961-12-10 MRN: [id number]   Airway Documentation:     Evaluation  O2 sats: stable throughout and currently acceptable Complications: No apparent complications Patient did tolerate procedure well. Bilateral Breath Sounds: Clear, Diminished   Yes  NixRaquel Sarna T 11/08/2017, 10:26 AM

## 2017-11-08 NOTE — Progress Notes (Signed)
[place] Progress Note Patient Name: Azael Ragain DOB: Oct 08, 1961 MRN: [id number]   Date of Service  11/08/2017  HPI/Events of Note  K+ = 3.4 and Creatinine = 1.34.   eICU Interventions  Will replace K+.     Intervention Category Major Interventions: Electrolyte abnormality - evaluation and management  Sommer,Steven Eugene 11/08/2017, 6:35 AM

## 2017-11-09 ENCOUNTER — Encounter (HOSPITAL_COMMUNITY): Payer: Self-pay | Admitting: *Deleted

## 2017-11-09 ENCOUNTER — Other Ambulatory Visit: Payer: Self-pay

## 2017-11-09 LAB — CBC
HCT: 37.8 % — ABNORMAL LOW (ref 39.0–52.0)
Hemoglobin: 13 g/dL (ref 13.0–17.0)
MCH: 33 pg (ref 26.0–34.0)
MCHC: 34.4 g/dL (ref 30.0–36.0)
MCV: 95.9 fL (ref 78.0–100.0)
Platelets: 204 10*3/uL (ref 150–400)
RBC: 3.94 MIL/uL — ABNORMAL LOW (ref 4.22–5.81)
RDW: 12.5 % (ref 11.5–15.5)
WBC: 11.7 10*3/uL — ABNORMAL HIGH (ref 4.0–10.5)

## 2017-11-09 LAB — BASIC METABOLIC PANEL
Anion gap: 11 (ref 5–15)
Anion gap: 9 (ref 5–15)
BUN: 18 mg/dL (ref 6–20)
BUN: 21 mg/dL — ABNORMAL HIGH (ref 6–20)
CO2: 29 mmol/L (ref 22–32)
CO2: 26 mmol/L (ref 22–32)
Calcium: 8.3 mg/dL — ABNORMAL LOW (ref 8.9–10.3)
Calcium: 8.2 mg/dL — ABNORMAL LOW (ref 8.9–10.3)
Chloride: 100 mmol/L — ABNORMAL LOW (ref 101–111)
Chloride: 102 mmol/L (ref 101–111)
Creatinine, Ser: 1.45 mg/dL — ABNORMAL HIGH (ref 0.61–1.24)
Creatinine, Ser: 1.37 mg/dL — ABNORMAL HIGH (ref 0.61–1.24)
GFR calc Af Amer: >60 mL/min (ref >60)
GFR calc Af Amer: >60 mL/min (ref >60)
GFR calc non Af Amer: 52 mL/min — ABNORMAL LOW (ref >60)
GFR calc non Af Amer: 56 mL/min — ABNORMAL LOW (ref >60)
Glucose, Bld: 254 mg/dL — ABNORMAL HIGH (ref 65–99)
Glucose, Bld: 197 mg/dL — ABNORMAL HIGH (ref 65–99)
Potassium: 2.7 mmol/L — CL (ref 3.5–5.1)
Potassium: 3 mmol/L — ABNORMAL LOW (ref 3.5–5.1)
Sodium: 140 mmol/L (ref 135–145)
Sodium: 137 mmol/L (ref 135–145)

## 2017-11-09 LAB — GLUCOSE, CAPILLARY
Glucose-Capillary: 167 mg/dL — ABNORMAL HIGH (ref 65–99)
Glucose-Capillary: 171 mg/dL — ABNORMAL HIGH (ref 65–99)
Glucose-Capillary: 198 mg/dL — ABNORMAL HIGH (ref 65–99)
Glucose-Capillary: 252 mg/dL — ABNORMAL HIGH (ref 65–99)
Glucose-Capillary: 286 mg/dL — ABNORMAL HIGH (ref 65–99)
Glucose-Capillary: 298 mg/dL — ABNORMAL HIGH (ref 65–99)

## 2017-11-09 LAB — MAGNESIUM: Magnesium: 1.9 mg/dL (ref 1.7–2.4)

## 2017-11-09 LAB — PHOSPHORUS: Phosphorus: 3.4 mg/dL (ref 2.5–4.6)

## 2017-11-09 MED ORDER — HYDROCORTISONE NA SUCCINATE PF 100 MG IJ SOLR
50.0000 mg | Freq: Two times a day (BID) | INTRAMUSCULAR | Status: DC
  Administered 2017-11-09 – 2017-11-10 (×2): 50 mg via INTRAVENOUS
  Filled 2017-11-09 (×2): qty 2

## 2017-11-09 MED ORDER — INSULIN ASPART 100 UNIT/ML ~~LOC~~ SOLN
0.0000 [IU] | Freq: Every day | SUBCUTANEOUS | Status: DC
  Administered 2017-11-09: 3 [IU] via SUBCUTANEOUS
  Administered 2017-11-10: 2 [IU] via SUBCUTANEOUS

## 2017-11-09 MED ORDER — ISOSORBIDE MONONITRATE ER 30 MG PO TB24
15.0000 mg | ORAL_TABLET | Freq: Every day | ORAL | Status: DC
  Administered 2017-11-09 – 2017-11-11 (×3): 15 mg via ORAL
  Filled 2017-11-09 (×3): qty 1

## 2017-11-09 MED ORDER — PNEUMOCOCCAL VAC POLYVALENT 25 MCG/0.5ML IJ INJ
0.5000 mL | INJECTION | INTRAMUSCULAR | Status: AC
  Administered 2017-11-11: 0.5 mL via INTRAMUSCULAR
  Filled 2017-11-09: qty 0.5

## 2017-11-09 MED ORDER — FOLIC ACID 1 MG PO TABS
1.0000 mg | ORAL_TABLET | Freq: Every day | ORAL | Status: DC
  Administered 2017-11-09 – 2017-11-11 (×3): 1 mg via ORAL
  Filled 2017-11-09 (×3): qty 1

## 2017-11-09 MED ORDER — CARVEDILOL 25 MG PO TABS
25.0000 mg | ORAL_TABLET | Freq: Two times a day (BID) | ORAL | Status: DC
  Administered 2017-11-09 – 2017-11-11 (×4): 25 mg via ORAL
  Filled 2017-11-09 (×5): qty 1

## 2017-11-09 MED ORDER — PANTOPRAZOLE SODIUM 20 MG PO TBEC
20.0000 mg | DELAYED_RELEASE_TABLET | Freq: Every day | ORAL | Status: DC
  Administered 2017-11-10 – 2017-11-11 (×2): 20 mg via ORAL
  Filled 2017-11-09 (×2): qty 1

## 2017-11-09 MED ORDER — THIAMINE HCL 100 MG/ML IJ SOLN
100.0000 mg | Freq: Every day | INTRAMUSCULAR | Status: DC
  Administered 2017-11-09 – 2017-11-10 (×2): 100 mg via INTRAVENOUS
  Filled 2017-11-09 (×3): qty 2

## 2017-11-09 MED ORDER — INFLUENZA VAC SPLIT QUAD 0.5 ML IM SUSY
0.5000 mL | PREFILLED_SYRINGE | INTRAMUSCULAR | Status: AC
  Administered 2017-11-11: 0.5 mL via INTRAMUSCULAR
  Filled 2017-11-09: qty 0.5

## 2017-11-09 MED ORDER — POTASSIUM CHLORIDE 10 MEQ/100ML IV SOLN
10.0000 meq | INTRAVENOUS | Status: AC
  Administered 2017-11-09 (×6): 10 meq via INTRAVENOUS
  Filled 2017-11-09 (×6): qty 100

## 2017-11-09 MED ORDER — GABAPENTIN 300 MG PO CAPS
300.0000 mg | ORAL_CAPSULE | Freq: Two times a day (BID) | ORAL | Status: DC
  Administered 2017-11-09 – 2017-11-10 (×2): 300 mg via ORAL
  Filled 2017-11-09 (×2): qty 1

## 2017-11-09 MED ORDER — INSULIN ASPART 100 UNIT/ML ~~LOC~~ SOLN
0.0000 [IU] | Freq: Three times a day (TID) | SUBCUTANEOUS | Status: DC
  Administered 2017-11-09 (×2): 3 [IU] via SUBCUTANEOUS
  Administered 2017-11-10: 8 [IU] via SUBCUTANEOUS
  Administered 2017-11-10 – 2017-11-11 (×3): 3 [IU] via SUBCUTANEOUS
  Administered 2017-11-11: 5 [IU] via SUBCUTANEOUS

## 2017-11-09 NOTE — Progress Notes (Signed)
[name] DOB: [birth year] MRN: [id number]   Date of Service  11/09/2017  HPI/Events of Note  Pain - Hx of neuropathic pain and is on Neurontin at home.   eICU Interventions  Will order: 1. Neurontin 300 mg now and BID.     Intervention Category Intermediate Interventions: Pain - evaluation and management  Rubie Ficco Eugene 11/09/2017, 9:02 PM
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
## 2017-11-09 NOTE — Progress Notes (Signed)
PULMONARY / CRITICAL CARE MEDICINE   Name: Brad [name] MRN: [id number] DOB: [birth year]    ADMISSION DATE:  11/06/2017 CONSULTATION DATE:  11/06/2017  REFERRING MD:  EDP - [place]  CHIEF COMPLAINT:  AMS and acute respiratory failure  HISTORY OF PRESENT ILLNESS:   56 yo male smoker from homeless shelter with altered mental status.  Intubated for airway protection.  Hx of polysubstance abuse, A fib, diastolic CHF, CKD 3, depression, DM, GERD, Hep C, HLD, HTN, neuropathy.  SUBJECTIVE:  Awake and alert, on RA in NAD. States he is feeling well.  VITAL SIGNS: BP (!) 163/107   Pulse 77   Temp 98.2 F (36.8 C)   Resp (!) 23   Ht 6\' 1"  (1.854 m)   Wt 202 lb 2.6 oz (91.7 kg)   SpO2 96%   BMI 26.67 kg/m   VENTILATOR SETTINGS:    INTAKE / OUTPUT: I/O last 3 completed shifts: In: 3172.8 [P.O.:720; I.V.:1442.8; NG/GT:910; IV Piggyback:100] Out: 5360 [Urine:5360]  PHYSICAL EXAMINATION:  General - awake and alert, following commands Eyes - PERRLA ENT -  normocephalic, atraumatic, no LAD Cardiac - S1, S2, RRR, no RMG Chest - clear throughout, diminished per bases Abd - soft, non tender, non distended Ext - no edema Skin - no rashes,no lesions,  warm and dry, intact Neuro - follows commands, moves all extremities    LABS:  BMET Recent Labs  Lab 11/07/17 0303 11/07/17 1811 11/08/17 0432 11/09/17 0313  NA 135  --  138 140  K 2.8* 3.6 3.4* 2.7*  CL 103  --  106 100*  CO2 24  --  25 29  BUN 16  --  19 18  CREATININE 1.41*  --  1.39* 1.45*  GLUCOSE 204*  --  221* 254*    Electrolytes Recent Labs  Lab 11/07/17 0303  11/07/17 1801 11/08/17 0432 11/09/17 0313  CALCIUM 8.4*  --   --  8.4* 8.3*  MG 1.8   < > 2.1 2.0 1.9  PHOS 2.1*   < > 2.3* 2.2* 3.4   < > = values in this interval not displayed.    CBC Recent Labs  Lab 11/07/17 0303 11/08/17 0432 11/09/17 0313  WBC 8.7 9.7 11.7*  HGB 12.8* 13.2 13.0  HCT 36.8* 38.8* 37.8*  PLT 210 180 204     Coag's Recent Labs  Lab 11/06/17 1245  APTT 30  INR 1.19    Sepsis Markers Recent Labs  Lab 11/06/17 0732 11/06/17 1118 11/06/17 1245 11/07/17 0303 11/08/17 0432  LATICACIDVEN 2.61* 1.11  --   --   --   PROCALCITON  --   --  <0.10 <0.10 <0.10    ABG Recent Labs  Lab 11/06/17 0749 11/06/17 1216 11/07/17 0435  PHART 7.430 7.561* 7.561*  PCO2ART 45.2 29.4* 28.1*  PO2ART 91.0 70.0* 115*    Liver Enzymes Recent Labs  Lab 11/06/17 0635 11/06/17 1245 11/08/17 0432  AST 37 33 27  ALT 29 24 19   ALKPHOS 84 66 61  BILITOT 0.5 0.6 0.5  ALBUMIN 3.3* 2.7* 2.8*    Cardiac Enzymes No results for input(s): TROPONINI, PROBNP in the last 168 hours.  Glucose Recent Labs  Lab 11/08/17 1214 11/08/17 1632 11/08/17 2003 11/09/17 0028 11/09/17 0403 11/09/17 0756  GLUCAP 83 114* 250* 298* 252* 167*    Imaging No results found.   STUDIES:  Brain MRI 11/16>>> chronic small vessel ischemic changes EEG 11/16 >>> generalized irregular delta activity  CULTURES: Blood 11/16>>> Urine  11/16>>> No growth Sputum 11/16>>>  ANTIBIOTICS: None  SIGNIFICANT EVENTS: 11/16 intubated, neuro consulted   LINES/TUBES: ETT 11/16>>>11/18  DISCUSSION: 56 yo with altered mental status and compromised airway with hx of polysubstance abuse.  Has elevated ammonia and UDS positive for opiates.  Hx of hepatitis C with previous imaging showing changes of cirrhosis.  ASSESSMENT / PLAN:  Acute respiratory failure with compromised airway.>> Resolved Currently on RA with sats of 98%  - Extubated 11/18 - Pulmonary Toilet - Mobilize - CXR 11/20 and prn  Relative adrenal insufficiency >> cortisol 2.6 from 11/16. - decrease solucortef to 50 mg Q 12  Acute metabolic encephalopathy.>> resolved - likely from meds and elevated ammonia   Hx of hepatitis C with cirrhosis and elevated ammonia. - continue lactulose - Ammonia level prn  Dysphagia. - passed bedside swallow per  nursing. - Taking po's  Hx of HTN with chronic diastolic CHF, HLD, A flutter. - prn labetalol IV - restart Imdur, Coreg 11/19, Norvasc, Lipitor  and catapres restarted 11/18  - will need to be restarted on home Eliquis 5 mg BID ( Flutter)  CKD 3. Hypokalemia. - replace electrolytes as needed - BMET 11/19 pm and in am - Mag level 11/20  DM type II with neuropathy. - SSI - hold outpt neurontin, elavil, baclofen - CBG to ACHS - Increase SS coverage to moderate  DVT prophylaxis - SQ heparin until Eliquis restarted SUP - protonix  Nutrition - NPO Goals of care - full code Will need social services consul to offer substance abuse counseling and for med assistance/ housing assistance.  For transfer to Tele bed and pick up by [facility] 11/20 am    Magdalen Spatz, AGACNP-BC [place] 11/09/2017, 11:12 AM Pager:  [phone]
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
## 2017-11-09 NOTE — Progress Notes (Signed)
Attempt to call report but patient hasn't been approved a room yet. Will call back in 5 mins. Modena Morrow E, RN 11/09/2017 3:10 PM

## 2017-11-09 NOTE — Clinical Social Work Note (Signed)
Clinical Social Work Assessment  Patient Details  Name: Brad [name] MRN: [id number] Date of Birth: [birth year]  Date of referral:  11/09/17               Reason for consult:  Housing Concerns/Homelessness                Permission sought to share information with:  Case Manager Permission granted to share information::  Yes, Verbal Permission Granted  Name::     Contractor::     Relationship::     Contact Information:     Housing/Transportation Living arrangements for the past 2 months:  No permanent address(pt reports being in a program but is unsure of the name of it. ) Source of Information:  Patient Patient Interpreter Needed:  None Criminal Activity/Legal Involvement Pertinent to Current Situation/Hospitalization:  No - Comment as needed Significant Relationships:  None Lives with:  Self Do you feel safe going back to the place where you live?  No(pt reports that he is unable to return to the facility. ) Need for family participation in patient care:  No (Coment)  Care giving concerns: CSW spoke with pt at bedside. At this time pt does not report any concerns at this time.     Social Worker assessment / plan:  CSW spoke with pt at bedside. During this time pt informed CSW that pt is from a residential facility but is unsure as to which one. Pt also reports that pt was released from jail on November 07, 2015 and has been having a hard time ever since. Pt reports that pt has no supports at this time.   Employment status:  Unemployed Forensic scientist:  Medicaid In [place] PT Recommendations:  Not assessed at this time Information / Referral to community resources:     Patient/Family's Response to care:  At this time pt appears to be understanding and agreeable to plan of care.   Patient/Family's Understanding of and Emotional Response to Diagnosis, Current Treatment, and Prognosis:  No further questions or concerns have been presented to CSW at this  time.  Emotional Assessment Appearance:    Attitude/Demeanor/Rapport:    Affect (typically observed):  Pleasant Orientation:  Oriented to Self, Oriented to Place, Oriented to  Time, Oriented to Situation Alcohol / Substance use:  Alcohol Use(sometimes. ) Psych involvement (Current and /or in the community):  No (Comment)(not at this time. )  Discharge Needs  Concerns to be addressed:  Discharge Planning Concerns, Homelessness Readmission within the last 30 days:  No Current discharge risk:  None Barriers to Discharge:  No Barriers Identified   Wetzel Bjornstad, [place] 11/09/2017, 9:18 AM

## 2017-11-09 NOTE — Progress Notes (Signed)
CSW has spoken with pt at bedside and provided resources for housing to pt as requested. Pt mentioned that pt was interested in [facility] but does not report any drug use to CSW at this time. CSW informed pt that CSW was not aware of [facility] taking individuals who are not seeking drug treatment. Pt expressed that he understood and just accepted shelter resources. At this time there are no further CSW needs. CSW signing off.       Virgie Dad Xochilth Standish, MSW, [facility] Clinical Social Worker [phone]

## 2017-11-10 ENCOUNTER — Telehealth: Payer: Self-pay | Admitting: Family Medicine

## 2017-11-10 ENCOUNTER — Inpatient Hospital Stay (HOSPITAL_COMMUNITY): Payer: Medicaid Other

## 2017-11-10 DIAGNOSIS — E1121 Type 2 diabetes mellitus with diabetic nephropathy: Secondary | ICD-10-CM

## 2017-11-10 DIAGNOSIS — E876 Hypokalemia: Secondary | ICD-10-CM

## 2017-11-10 DIAGNOSIS — Z794 Long term (current) use of insulin: Secondary | ICD-10-CM

## 2017-11-10 DIAGNOSIS — E1140 Type 2 diabetes mellitus with diabetic neuropathy, unspecified: Secondary | ICD-10-CM

## 2017-11-10 DIAGNOSIS — K7460 Unspecified cirrhosis of liver: Secondary | ICD-10-CM

## 2017-11-10 DIAGNOSIS — Z590 Homelessness: Secondary | ICD-10-CM

## 2017-11-10 DIAGNOSIS — I10 Essential (primary) hypertension: Secondary | ICD-10-CM

## 2017-11-10 DIAGNOSIS — I5022 Chronic systolic (congestive) heart failure: Secondary | ICD-10-CM

## 2017-11-10 DIAGNOSIS — I5032 Chronic diastolic (congestive) heart failure: Secondary | ICD-10-CM

## 2017-11-10 DIAGNOSIS — B182 Chronic viral hepatitis C: Secondary | ICD-10-CM

## 2017-11-10 DIAGNOSIS — I428 Other cardiomyopathies: Secondary | ICD-10-CM

## 2017-11-10 DIAGNOSIS — E1165 Type 2 diabetes mellitus with hyperglycemia: Secondary | ICD-10-CM

## 2017-11-10 DIAGNOSIS — K219 Gastro-esophageal reflux disease without esophagitis: Secondary | ICD-10-CM

## 2017-11-10 LAB — CBC WITH DIFFERENTIAL/PLATELET
Basophils Absolute: 0 10*3/uL (ref 0.0–0.1)
Basophils Relative: 0 %
Eosinophils Absolute: 0 10*3/uL (ref 0.0–0.7)
Eosinophils Relative: 0 %
HCT: 37.3 % — ABNORMAL LOW (ref 39.0–52.0)
Hemoglobin: 12.7 g/dL — ABNORMAL LOW (ref 13.0–17.0)
Lymphocytes Relative: 31 %
Lymphs Abs: 3 10*3/uL (ref 0.7–4.0)
MCH: 32.6 pg (ref 26.0–34.0)
MCHC: 34 g/dL (ref 30.0–36.0)
MCV: 95.6 fL (ref 78.0–100.0)
Monocytes Absolute: 0.7 10*3/uL (ref 0.1–1.0)
Monocytes Relative: 8 %
Neutro Abs: 6 10*3/uL (ref 1.7–7.7)
Neutrophils Relative %: 61 %
Platelets: 195 10*3/uL (ref 150–400)
RBC: 3.9 MIL/uL — ABNORMAL LOW (ref 4.22–5.81)
RDW: 12.5 % (ref 11.5–15.5)
WBC: 9.7 10*3/uL (ref 4.0–10.5)

## 2017-11-10 LAB — RENAL FUNCTION PANEL
Albumin: 2.8 g/dL — ABNORMAL LOW (ref 3.5–5.0)
Anion gap: 9 (ref 5–15)
BUN: 21 mg/dL — ABNORMAL HIGH (ref 6–20)
CO2: 28 mmol/L (ref 22–32)
Calcium: 8.4 mg/dL — ABNORMAL LOW (ref 8.9–10.3)
Chloride: 100 mmol/L — ABNORMAL LOW (ref 101–111)
Creatinine, Ser: 1.42 mg/dL — ABNORMAL HIGH (ref 0.61–1.24)
GFR calc Af Amer: >60 mL/min (ref >60)
GFR calc non Af Amer: 53 mL/min — ABNORMAL LOW (ref >60)
Glucose, Bld: 200 mg/dL — ABNORMAL HIGH (ref 65–99)
Phosphorus: 2.5 mg/dL (ref 2.5–4.6)
Potassium: 2.9 mmol/L — ABNORMAL LOW (ref 3.5–5.1)
Sodium: 137 mmol/L (ref 135–145)

## 2017-11-10 LAB — GLUCOSE, CAPILLARY
Glucose-Capillary: 170 mg/dL — ABNORMAL HIGH (ref 65–99)
Glucose-Capillary: 262 mg/dL — ABNORMAL HIGH (ref 65–99)
Glucose-Capillary: 159 mg/dL — ABNORMAL HIGH (ref 65–99)
Glucose-Capillary: 227 mg/dL — ABNORMAL HIGH (ref 65–99)

## 2017-11-10 LAB — MAGNESIUM: Magnesium: 1.9 mg/dL (ref 1.7–2.4)

## 2017-11-10 MED ORDER — LACTULOSE 10 GM/15ML PO SOLN
20.0000 g | Freq: Two times a day (BID) | ORAL | Status: DC
  Administered 2017-11-10 – 2017-11-11 (×2): 20 g via ORAL
  Filled 2017-11-10 (×2): qty 30

## 2017-11-10 MED ORDER — INSULIN GLARGINE 100 UNIT/ML ~~LOC~~ SOLN
10.0000 [IU] | Freq: Every day | SUBCUTANEOUS | Status: DC
  Administered 2017-11-10 – 2017-11-11 (×2): 10 [IU] via SUBCUTANEOUS
  Filled 2017-11-10 (×3): qty 0.1

## 2017-11-10 MED ORDER — VITAMIN B-1 100 MG PO TABS
100.0000 mg | ORAL_TABLET | Freq: Every day | ORAL | Status: DC
  Administered 2017-11-11: 100 mg via ORAL

## 2017-11-10 MED ORDER — GABAPENTIN 300 MG PO CAPS
600.0000 mg | ORAL_CAPSULE | Freq: Two times a day (BID) | ORAL | Status: DC
  Administered 2017-11-10 – 2017-11-11 (×2): 600 mg via ORAL
  Filled 2017-11-10 (×2): qty 2

## 2017-11-10 MED ORDER — MELATONIN 3 MG PO TABS
6.0000 mg | ORAL_TABLET | Freq: Every evening | ORAL | Status: DC | PRN
  Administered 2017-11-10: 6 mg via ORAL
  Filled 2017-11-10 (×2): qty 2

## 2017-11-10 MED ORDER — APIXABAN 5 MG PO TABS
5.0000 mg | ORAL_TABLET | Freq: Two times a day (BID) | ORAL | Status: DC
  Administered 2017-11-10 – 2017-11-11 (×3): 5 mg via ORAL
  Filled 2017-11-10 (×3): qty 1

## 2017-11-10 MED ORDER — GABAPENTIN 300 MG PO CAPS
300.0000 mg | ORAL_CAPSULE | Freq: Once | ORAL | Status: AC
  Administered 2017-11-10: 300 mg via ORAL
  Filled 2017-11-10: qty 1

## 2017-11-10 MED ORDER — POTASSIUM CHLORIDE 20 MEQ PO PACK
40.0000 meq | PACK | Freq: Once | ORAL | Status: AC
Start: 2017-11-10 — End: 2017-11-10
  Administered 2017-11-10: 40 meq via ORAL
  Filled 2017-11-10 (×2): qty 2

## 2017-11-10 MED ORDER — PREMIER PROTEIN SHAKE
11.0000 [oz_av] | Freq: Two times a day (BID) | ORAL | Status: DC
  Administered 2017-11-11 (×2): 11 [oz_av] via ORAL
  Filled 2017-11-10 (×6): qty 325.31

## 2017-11-10 NOTE — Hospital Discharge Follow-Up (Signed)
Met with the patient this afternoon. He is known to the [facility] at [facility]. An appointment has been scheduled for on 11/18/17 @ 1400 and the information has been placed on the AVS.  He is still not sure where he will be going after discharge as he is homeless and can't return to the [facility] that he had been attending. Marland Kitchen  He stated that he called the [facility] and has been told to keep calling back to check for bed availability. Provided him with the contact information for [facility] in [place] to call to check on bed availability.  Will continue to follow his hospital course.

## 2017-11-10 NOTE — Progress Notes (Signed)
Inpatient Diabetes Program Recommendations  AACE/ADA: New Consensus Statement on Inpatient Glycemic Control (2015)  Target Ranges:  Prepandial:   less than 140 mg/dL      Peak postprandial:   less than 180 mg/dL (1-2 hours)      Critically ill patients:  140 - 180 mg/dL   Lab Results  Component Value Date   GLUCAP 170 (H) 11/10/2017   HGBA1C 9.1 10/07/2017    Review of Glycemic Control  Results for [name], [name] (MRN [id number]) as of 11/10/2017 10:25  Ref. Range 11/09/2017 07:56 11/09/2017 11:49 11/09/2017 16:29 11/09/2017 23:20 11/10/2017 07:48  Glucose-Capillary Latest Ref Range: 65 - 99 mg/dL 167 (H) 198 (H) 171 (H) 286 (H) 170 (H)   Diabetes history: Type 2 Outpatient Diabetes medications: lantus 57 units qhs Current orders for Inpatient glycemic control: Novolog 0-9 units tid, Novolog 0-5 units qhs  Inpatient Diabetes Program Recommendations:  Fasting blood sugar 170mg /dl-consider adding Lantus 10 units qhs.  Gentry Fitz, RN, BA, MHA, CDE Diabetes Coordinator Inpatient Diabetes Program  [phone] (Team Pager) [phone] ([place]) 11/10/2017 10:30 AM

## 2017-11-10 NOTE — Progress Notes (Addendum)
Nutrition Follow-up  DOCUMENTATION CODES:   Not applicable  INTERVENTION:  1. Premier Protein BID, each supplement provides 160 calories and 30 grams of protein  NUTRITION DIAGNOSIS:   Inadequate oral intake related to inability to eat as evidenced by NPO status. -resolved  GOAL:   Patient will meet greater than or equal to 90% of their needs -meeting with PO intake  MONITOR:   PO intake, I & O's, Labs, Supplement acceptance, Weight trends  ASSESSMENT:   56 y/o male PMHx Polysubstance abuse, Afib, CHF, CKD3, Depression, DM, GERD, Hep C, HLD/HTN, tobacco abuse. Was found unresponsive at homeless shelter. Brought to ED and intubated for airway protection. Question of seizures vs overdose with UDS + opiates.  Spoke with Mr. Nemes at bedside. Patient states he has been eating well, had french toast and eggs this morning. Appetite good.  Seems back to normal cognitively per neurology.  Labs reviewed:  K 2.9 CBGs 262, 170, 286 Medications reviewed and include:  Folic Acid, Insulin, Lactulose, Thiamine  Meal Completion: 100%   Intake/Output Summary (Last 24 hours) at 11/10/2017 1540 Last data filed at 11/10/2017 1501 Gross per 24 hour  Intake 440 ml  Output 400 ml  Net 40 ml     Diet Order:  Diet heart healthy/carb modified Room service appropriate? Yes; Fluid consistency: Thin  EDUCATION NEEDS:   No education needs have been identified at this time  Skin:  Skin Assessment: Reviewed RN Assessment  Last BM:  11/09/2017 (Type 6)  Height:   Ht Readings from Last 1 Encounters:  11/06/17 6\' 1"  (1.854 m)    Weight:   Wt Readings from Last 1 Encounters:  11/10/17 197 lb 3.2 oz (89.4 kg)    Ideal Body Weight:  80.91 kg  BMI:  Body mass index is 26.02 kg/m.  Estimated Nutritional Needs:   Kcal:  2130 kcals  Protein:  127-145 g (1.4-1.6g/kg bw)  Fluid:  Per md  Satira Anis. Kase Shughart, MS, RD LDN Inpatient Clinical Dietitian Pager [phone]

## 2017-11-10 NOTE — Progress Notes (Addendum)
PROGRESS NOTE  Brad [name] VZD:[id number] DOB: [birth year] DOA: 11/06/2017 PCP: Arnoldo Morale, MD  HPI/Recap of past 24 hours:  Brad [name] is a 56 y.o. year old male with medical history significant for polysubstance, hepatitis C and cirrhosis, systolic congestive heart failure produce who presented on 11/06/2017 after being found down in a homeless shelter and was found to have acute hypoxic respiratory failure secondary to toxic metabolic encephalopathy (opioid intoxication, hepatic encephalopathy).Upon arrival to ED patient was given Narcan with no response and was intubated for airway protection and admitted to ICU.  UDS was positive for opioids, ammonia elevated at 65, glucose 237 and creatinine 1.58 on admission. Ethanol level, salicylate level, acetaminophen level are unremarkable.  Neurology evaluated patient in ICU given reports of being found in urine however EEG was negative for any signs of seizure.  CT head showed no acute intracranial abnormalities MRI brain showed no acute intracranial abnormalities.  Only chronic small vessel ischemic disease and mild parenchymal volume loss.  Cognitively patient returned to baseline with lactulose, discontinuation of sedating medications ( baclofen, gabapentin, elavil).    This morning patient reports he remember taking a friends opioids prior to his hospitalization which he states he has never done before. Denies any complaints currently. States he can't remember much more about what happened before he was admitted  Assessment/Plan: Active Problems:   GERD (gastroesophageal reflux disease)   Chronic diastolic congestive heart failure (HCC)   Chronic hepatitis C with cirrhosis ([place])   Essential hypertension   Uncontrolled diabetes mellitus with diabetic nephropathy, with long-term current use of insulin (HCC)   Hypokalemia   CKD (chronic kidney disease) stage 3, GFR 30-59 ml/min (HCC)   Painful diabetic neuropathy (HCC)    Homelessness   Acute respiratory failure with hypoxia (HCC)   Acute hypoxic respiratory failure, resolved On admission poor response to Narcan in setting of presumed opioid intoxication and hepatic encephalopathy in a patient with known hepatitis C and cirrhosis.  Other neurologic etiologies were ruled out with normal CTA, MRI brain, and EEG.  Patient was extubated successfully on 11/18 and now doing well on room air.  Acute metabolic encephalopathy, multifactorial, resolved Likely secondary to hepatic encephalopathy and opiate overdose.  Ammonia elevated at 65 Continue lactulose BID, decreased dose given increased BM and patient with no signs of asterixis or confusion  Chronic Hepatitis C, Liver Cirrhosis Hepatic encephalopathy, resolved Abdominal ultrasound from 06/26/2016 showed concerns for hepatic cirrhosis.  Patient is hep C positive (02/16/2012).  Last normal viral load of 2.290161 on (01/04/2016).  LFTs, platelets, INR all within normal limits. Will need ID and GI follow-up as outpatient Decrease to Lactulose 20 mg BID  Nonischemic Cardiomyopathy Combined diastolic and systolic congestive heart failure, euvolemic TTE (08/06/2017): EF 35-40%.  Grade 2 dysfunction. NO wall motion abnormalities.  Likely related to IV drug abuse as LHC x 2 in 2016 and 2017 with mild non-obstructive disease. During ICU stay carvedilol increased to 25 mg twice daily and patient started on imdur 50 mg daily -carvedilol, imdur -given reduced EF would benefit from lisinopril but contraindicated because of ANGIOEDEMA  Concern for adrenal insufficiency Cortisol on admission 2.6.  Patient was started on IV stress dose steroids No prior history of chronic prednisone use.  Most likely related to critical illness affecting HPA axis . BP is actually quite elevated which is likely due to prolonged steroids -Discontinue IV stress steroids (11/120/18) -,continue to monitor blood pressure if no lows in 24 hours off  steroids no need  for chronic prednisone  Hypertension, poorly controlled As mentioned above, most likely related to stress dosed IV steroids for the last 4 days. Expect improved over next 24 hours after discontinuation. Several BP agents have been added/increased to attempt control ( coreg, clonidine, amlodipine, imdur) Coreg, Imdur Continue home amlodipine and clonidine If BP still uncontrolled consider d/c imdur and start bidil since lisinopril not an option ( angioedema) PRN IV Labetalol (SBP>170)  Hyperlipidemia Home Lipitor  Atrial flutter/fibrillation, rate controlled No signs or symptoms of bleeding. CHADSVASC2 score: 3(CHF, DM, HTN) Resume eliquis today Monitor on telemetry  CKD, stage III Baseline Creatinine 1.5-1.7. Currently at baseline. Likely secondary to T2DM and HTN Avoid nephrotoxins Daily BMP  Type 2 diabetes with peripheral neuropathy A1c 9.1 (09/2017). Home regimen Lantus 60 U qd His blood glucose has surprisingly not been excessively high. Most recent fasting blood glucose of 200 this morning on only short acting insulin. Likely related to poor liver function secondary to cirrhosis -Will very likely need greatly reduced dose of insulin on discharge  -Start Lantus 10 U tonight, monitor blood glucose -Started reduced dose gabapentin 600 mg BID given sedating profile  Hypokalemia Oral potassium repletion Monitor BMP  GERD  protonix   Code Status: Full COde   Family Communication: Patient updated, no family at bedside   Disposition Plan: monitoring BP ( discontinued IV stress steroids) likely d/c on 11/21 to home ( patient is homeless)    Consultants:  Neurology   Procedures:  EEG 11/16: Generalized irregular delta activity, consistent with generalized nonspecific cerebral dysfunction. This pattern can be seen with sedative medication such as propofol,among other causes. There was no seizure or seizure predisposition recorded on this study. Please  note that a normal EEG does not preclude the possibility of epilepsy  UDS- positive for opiates  Intubated 11/16-Extubated 11/08/17  Antimicrobials:  None  Cultures:  11/16 blood cultures x2- Negative growth to date  11/16 urine culture negative  DVT prophylaxis:  SCDs   Objective: Vitals:   11/10/17 0448 11/10/17 0500 11/10/17 0811 11/10/17 1300  BP: 136/79  (!) 173/97 116/69  Pulse: 74   72  Resp: 20   18  Temp: 98.8 F (37.1 C)   98.5 F (36.9 C)  TempSrc: Oral   Oral  SpO2: 96%   96%  Weight:  89.4 kg (197 lb 3.2 oz)    Height:        Intake/Output Summary (Last 24 hours) at 11/10/2017 1832 Last data filed at 11/10/2017 1501 Gross per 24 hour  Intake 440 ml  Output 400 ml  Net 40 ml   Filed Weights   11/09/17 0235 11/09/17 1543 11/10/17 0500  Weight: 91.7 kg (202 lb 2.6 oz) 89.8 kg (197 lb 15.6 oz) 89.4 kg (197 lb 3.2 oz)    Exam:  General: Lying in bed in Appear in no distress Eyes:anicteric sclera ENT: Oral Mucosa clear moist. Neck: no JVD Cardiovascular: regular rate and rhythm, no Murmurs, rubs or gallops, peripheral Pulses Present,  no edema Respiratory: Normal respiratory effort, Bilateral Air entry equal,Clear to Auscultation, No Crackles or wheezes Abdomen: soft, non-distended, non-tender, normal bowel sounds, no guarding or rebound tenderness, no fluid wave Skin: No Rash Musculoskeletal:Good ROM, no contractures. Normal muscle tone Neurologic: Grossly no focal neuro deficit. No asterixis, Mental status AAOx3, speech normal, Psychiatric: appropriate affect, and mood  Data Reviewed: CBC: Recent Labs  Lab 11/06/17 0635 11/06/17 1245 11/07/17 0303 11/08/17 0432 11/09/17 0313 11/10/17 0408  WBC 7.1 6.8 8.7  9.7 11.7* 9.7  NEUTROABS 5.5  --   --   --   --  6.0  HGB 13.6 12.4* 12.8* 13.2 13.0 12.7*  HCT 39.7 36.1* 36.8* 38.8* 37.8* 37.3*  MCV 95.7 94.5 93.4 94.9 95.9 95.6  PLT 216 215 210 180 204 867   Basic Metabolic Panel: Recent  Labs  Lab 11/07/17 0303 11/07/17 1219 11/07/17 1801 11/07/17 1811 11/08/17 0432 11/09/17 0313 11/09/17 1834 11/10/17 0408  NA 135  --   --   --  138 140 137 137  K 2.8*  --   --  3.6 3.4* 2.7* 3.0* 2.9*  CL 103  --   --   --  106 100* 102 100*  CO2 24  --   --   --  25 29 26 28   GLUCOSE 204*  --   --   --  221* 254* 197* 200*  BUN 16  --   --   --  19 18 21* 21*  CREATININE 1.41*  --   --   --  1.39* 1.45* 1.37* 1.42*  CALCIUM 8.4*  --   --   --  8.4* 8.3* 8.2* 8.4*  MG 1.8 1.9 2.1  --  2.0 1.9  --  1.9  PHOS 2.1* 2.3* 2.3*  --  2.2* 3.4  --  2.5   GFR: Estimated Creatinine Clearance: 64.9 mL/min (A) (by C-G formula based on SCr of 1.42 mg/dL (H)). Liver Function Tests: Recent Labs  Lab 11/06/17 0635 11/06/17 1245 11/08/17 0432 11/10/17 0408  AST 37 33 27  --   ALT 29 24 19   --   ALKPHOS 84 66 61  --   BILITOT 0.5 0.6 0.5  --   PROT 9.2* 7.3 7.7  --   ALBUMIN 3.3* 2.7* 2.8* 2.8*   Recent Labs  Lab 11/06/17 1245  LIPASE 16   Recent Labs  Lab 11/06/17 0803  AMMONIA 65*   Coagulation Profile: Recent Labs  Lab 11/06/17 1245  INR 1.19   Cardiac Enzymes: No results for input(s): CKTOTAL, CKMB, CKMBINDEX, TROPONINI in the last 168 hours. BNP (last 3 results) No results for input(s): PROBNP in the last 8760 hours. HbA1C: No results for input(s): HGBA1C in the last 72 hours. CBG: Recent Labs  Lab 11/09/17 1629 11/09/17 2320 11/10/17 0748 11/10/17 1148 11/10/17 1653  GLUCAP 171* 286* 170* 262* 159*   Lipid Profile: No results for input(s): CHOL, HDL, LDLCALC, TRIG, CHOLHDL, LDLDIRECT in the last 72 hours. Thyroid Function Tests: No results for input(s): TSH, T4TOTAL, FREET4, T3FREE, THYROIDAB in the last 72 hours. Anemia Panel: No results for input(s): VITAMINB12, FOLATE, FERRITIN, TIBC, IRON, RETICCTPCT in the last 72 hours. Urine analysis:    Component Value Date/Time   COLORURINE YELLOW 11/06/2017 [place] 11/06/2017 0917    LABSPEC 1.007 11/06/2017 0917   PHURINE 6.0 11/06/2017 0917   GLUCOSEU >=500 (A) 11/06/2017 0917   HGBUR SMALL (A) 11/06/2017 0917   BILIRUBINUR NEGATIVE 11/06/2017 0917   BILIRUBINUR neg 05/08/2017 1503   KETONESUR NEGATIVE 11/06/2017 0917   PROTEINUR 100 (A) 11/06/2017 0917   UROBILINOGEN 0.2 05/08/2017 1503   UROBILINOGEN 1.0 04/26/2012 1328   NITRITE NEGATIVE 11/06/2017 0917   LEUKOCYTESUR NEGATIVE 11/06/2017 0917   Sepsis Labs: @LABRCNTIP (procalcitonin:4,lacticidven:4)  ) Recent Results (from the past 240 hour(s))  Culture, blood (routine x 2)     Status: None (Preliminary result)   Collection Time: 11/06/17  8:03 AM  Result Value Ref Range  Status   Specimen Description BLOOD RIGHT FOREARM  Final   Special Requests   Final    BOTTLES DRAWN AEROBIC AND ANAEROBIC Blood Culture adequate volume   Culture NO GROWTH 4 DAYS  Final   Report Status PENDING  Incomplete  Culture, blood (routine x 2)     Status: None (Preliminary result)   Collection Time: 11/06/17  8:05 AM  Result Value Ref Range Status   Specimen Description BLOOD LEFT HAND  Final   Special Requests   Final    BOTTLES DRAWN AEROBIC AND ANAEROBIC Blood Culture adequate volume   Culture NO GROWTH 4 DAYS  Final   Report Status PENDING  Incomplete  Urine culture     Status: None   Collection Time: 11/06/17  1:41 PM  Result Value Ref Range Status   Specimen Description URINE, CATHETERIZED  Final   Special Requests NONE  Final   Culture NO GROWTH  Final   Report Status 11/08/2017 FINAL  Final  MRSA PCR Screening     Status: None   Collection Time: 11/06/17  4:38 PM  Result Value Ref Range Status   MRSA by PCR NEGATIVE NEGATIVE Final    Comment:        The GeneXpert MRSA Assay (FDA approved for NASAL specimens only), is one component of a comprehensive MRSA colonization surveillance program. It is not intended to diagnose MRSA infection nor to guide or monitor treatment for MRSA infections.        Studies: Dg Chest Port 1 View  Result Date: 11/10/2017 CLINICAL DATA:  Shortness of breath. EXAM: PORTABLE CHEST 1 VIEW COMPARISON:  11/08/2017 . FINDINGS: Interim extubation and removal of NG tube. Cardiomegaly with normal pulmonary vascularity. Mild left base atelectasis/infiltrate. No prominent pleural effusion. No pneumothorax. IMPRESSION: 1.  Interim extubation removal of NG tube . 2. Low lung volumes with mild left base atelectasis/infiltrate. 3. Cardiomegaly with normal pulmonary vascularity . Electronically Signed   By: Abrams   On: 11/10/2017 09:16    Scheduled Meds: . amLODipine  10 mg Oral Daily  . apixaban  5 mg Oral BID  . atorvastatin  40 mg Oral q1800  . carvedilol  25 mg Oral BID WC  . cloNIDine  0.1 mg Oral BID  . folic acid  1 mg Oral Daily  . gabapentin  600 mg Oral BID  . Influenza vac split quadrivalent PF  0.5 mL Intramuscular Tomorrow-1000  . insulin aspart  0-15 Units Subcutaneous TID WC  . insulin aspart  0-5 Units Subcutaneous QHS  . isosorbide mononitrate  15 mg Oral Daily  . lactulose  20 g Oral BID  . pantoprazole  20 mg Oral Daily  . pneumococcal 23 valent vaccine  0.5 mL Intramuscular Tomorrow-1000  . protein supplement shake  11 oz Oral BID BM  . [START ON 11/11/2017] thiamine  100 mg Oral Daily    Continuous Infusions:   LOS: 4 days     Desiree Hane, MD [facility] Pager [phone]  If 7PM-7AM, please contact night-coverage [email or web address] Password [facility] 11/10/2017, 6:32 PM

## 2017-11-10 NOTE — Discharge Instructions (Signed)

## 2017-11-10 NOTE — Telephone Encounter (Signed)
Pt. Called to let [name] know that he has been at the hospital for a couple of days. Pt. States that he was told that his BP medications is to high for him and he passed out.  Pt. States he is in room [address] 31. Please f/u

## 2017-11-11 ENCOUNTER — Telehealth: Payer: Self-pay

## 2017-11-11 LAB — CULTURE, BLOOD (ROUTINE X 2)
Culture: NO GROWTH
Culture: NO GROWTH
Special Requests: ADEQUATE
Special Requests: ADEQUATE

## 2017-11-11 LAB — BASIC METABOLIC PANEL
Anion gap: 8 (ref 5–15)
BUN: 25 mg/dL — ABNORMAL HIGH (ref 6–20)
CO2: 30 mmol/L (ref 22–32)
Calcium: 8.6 mg/dL — ABNORMAL LOW (ref 8.9–10.3)
Chloride: 97 mmol/L — ABNORMAL LOW (ref 101–111)
Creatinine, Ser: 1.37 mg/dL — ABNORMAL HIGH (ref 0.61–1.24)
GFR calc Af Amer: >60 mL/min (ref >60)
GFR calc non Af Amer: 56 mL/min — ABNORMAL LOW (ref >60)
Glucose, Bld: 232 mg/dL — ABNORMAL HIGH (ref 65–99)
Potassium: 2.8 mmol/L — ABNORMAL LOW (ref 3.5–5.1)
Sodium: 135 mmol/L (ref 135–145)

## 2017-11-11 LAB — GLUCOSE, CAPILLARY
Glucose-Capillary: 225 mg/dL — ABNORMAL HIGH (ref 65–99)
Glucose-Capillary: 159 mg/dL — ABNORMAL HIGH (ref 65–99)

## 2017-11-11 MED ORDER — POTASSIUM CHLORIDE CRYS ER 20 MEQ PO TBCR
40.0000 meq | EXTENDED_RELEASE_TABLET | Freq: Once | ORAL | Status: AC
  Administered 2017-11-11: 40 meq via ORAL
  Filled 2017-11-11: qty 2

## 2017-11-11 NOTE — Telephone Encounter (Signed)
Call received from the patient. He said that he will not be able to stay with the friend in [place]. Informed him that this CM would update his hospital CM.  This CM spoke to Jacqualin Combes, RN CM and informed her of the patient's homelessness. There is a bed a [facility] in [place] but there is no guarantee how long they will hold the bed as it is first come, first served. She will follow up with the shelter to inquire if they will hold the bed. She will also check with patient's provider regarding status of discharge and any new medication needed.    Call received from Jacqualin Combes, RN CM noting that the shelter will hold his bed until 1700. The discharging physician informed her that he will not be discharged with any new medication.   Call placed to [facility] to confirm bus stop near the facility. The receptionist stated that the patient would need to take the [place] bus to PART station and then PART bus to the shelter. She was not sure of cost of PART ticket but stated that it would be about $3.00 -4.00 The bus stop is about 1 block from the shelter.  [place] can provide [place] bus passes but does not have PART bus passes.  This information was shared with Jacqualin Combes, RN CM .

## 2017-11-11 NOTE — Progress Notes (Signed)
Inpatient Diabetes Program Recommendations  AACE/ADA: New Consensus Statement on Inpatient Glycemic Control (2015)  Target Ranges:  Prepandial:   less than 140 mg/dL      Peak postprandial:   less than 180 mg/dL (1-2 hours)      Critically ill patients:  140 - 180 mg/dL   Results for Brad [name], Brad [name] (MRN [id number]) as of 11/11/2017 09:21  Ref. Range 11/10/2017 07:48 11/10/2017 11:48 11/10/2017 16:53 11/10/2017 20:47 11/11/2017 07:56  Glucose-Capillary Latest Ref Range: 65 - 99 mg/dL 170 (H) 262 (H) 159 (H) 227 (H) 225 (H)   Review of Glycemic Control  Diabetes history: Type 2 Outpatient Diabetes medications: lantus 57 units qhs Current orders for Inpatient glycemic control: Lantus 10 units QHS, Novolog 0-9 units tid, Novolog 0-5 units qhs  Inpatient Diabetes Program Recommendations:    Glucose trends above inpatient goal on Lantus 10 units. Patient takes more Lantus at home. Please consider increasing Lantus to 15 units.  Thanks,  Tama Headings RN, MSN, [facility] Inpatient Diabetes Coordinator Team Pager [phone] (8a-5p)

## 2017-11-11 NOTE — Telephone Encounter (Signed)
Call received from Jacqualin Combes, RN CM stating that the patient's pastor will now pay for him to stay at a motel.   Call received from the patient confirming that his Doristine Bosworth will be picking him up and  will pay for him to stay at a motel. The patient was not sure for how long he would be there but stated that he has the contact information for the shelters if needed when he has to leave the motel.  He is anxious to have his disability hearing on 11/19/17 and hopes that he is approved.    She stated that the hospital has his medications locked up in the pharmacy and he will receive those at discharge. He explained that he will call the Pastor at 1500 to confirm the plan for pick up.   He said that he will call this CM on 11/16/17 to confirm where he is staying and to confirm his appointment at [facility] next week and scheduled transportation if needed. [place] to provide cab transportation to the clinic but he will need to inform [facility] where the cab is to pick him up.

## 2017-11-11 NOTE — Progress Notes (Signed)
Patient was discharged home by MD order; discharged instructions  review and give to patient with care notes; IV DIC; skin intact; patient will be escorted to the car by nurse tech via wheelchair.  

## 2017-11-11 NOTE — Discharge Summary (Signed)
Physician Discharge Summary  Richard Ritchey PZW:[id number] DOB: Sep 27, 1961 DOA: 11/06/2017  PCP: Arnoldo Morale, MD  Admit date: 11/06/2017 Discharge date: 11/11/2017  Time spent: > 35 minutes  Recommendations for Outpatient Follow-up:  1. Monitor ammonia levels. 2. Encourage pt not to take medications he has not been prescribed.   Discharge Diagnoses:  Active Problems:   GERD (gastroesophageal reflux disease)   Chronic diastolic congestive heart failure (HCC)   Chronic hepatitis C with cirrhosis (HCC)   Essential hypertension   Uncontrolled diabetes mellitus with diabetic nephropathy, with long-term current use of insulin (HCC)   Hypokalemia   CKD (chronic kidney disease) stage 3, GFR 30-59 ml/min (HCC)   Painful diabetic neuropathy ([place])   Homelessness   Acute respiratory failure with hypoxia ([place])   Discharge Condition: stable  Diet recommendation: heart healthy carb modified.  Filed Weights   11/09/17 1543 11/10/17 0500 11/11/17 0600  Weight: 89.8 kg (197 lb 15.6 oz) 89.4 kg (197 lb 3.2 oz) 89.7 kg (197 lb 12.8 oz)    History of present illness:  56 year old male with history of polysubstance abuse presenting to [facility] after being found unresponsive in a homeless shelter.  EMS was called and patient was brought to the ED.  Patient was given narcan with no response and was intubated for airway protection.     Hospital Course:  Metabolic encephalopathy - resolved with lactulose and time - it is most likely patient may have taken opiod. - He is alert and oriented x 3 and would like to go home. Was not on lactulose on review of his home medication regimen.  - I recommend outpatient pcp reassess ammonia levels after hospital discharge and consider placing on regimen moving forward. I am not giving prescription for lactulose as he was having loose stools and there is another reason he was altered (reports of ingestion of opiod medication regimen).  Otherwise for known  medical problems prior to admission will continue prior to admission medication regimen.  Procedures:  intubation  Consultations:  Critical care team  Discharge Exam: Vitals:   11/10/17 2055 11/11/17 0600  BP: (!) 134/55 (!) 145/81  Pulse: 63 70  Resp: 16   Temp: (!) 97.5 F (36.4 C) 97.8 F (36.6 C)  SpO2: 100% 98%    General: Pt in nad, alert and awake Cardiovascular: rrr, no rubs Respiratory: no increased wob, no wheezes Neuro: Oriented x 3: person, place, time, president  Discharge Instructions   Discharge Instructions    Call MD for:  severe uncontrolled pain   Complete by:  As directed    Call MD for:  temperature >100.4   Complete by:  As directed    Diet - low sodium heart healthy   Complete by:  As directed    Discharge instructions   Complete by:  As directed    Monitor ammonia levels and decide whether or not to continue lactulose. Pt alert and awake and was having loose BM's as such it was held. He was not on this medication when reviewing his MAR.   Increase activity slowly   Complete by:  As directed      Current Discharge Medication List    CONTINUE these medications which have NOT CHANGED   Details  acetaminophen-codeine (TYLENOL #3) 300-30 MG tablet Take 1-2 tablets every 6 (six) hours as needed by mouth for moderate pain. Qty: 15 tablet, Refills: 0    amitriptyline (ELAVIL) 75 MG tablet Take 1 tablet (75 mg total) by mouth  at bedtime. Qty: 30 tablet, Refills: 11    amLODipine (NORVASC) 10 MG tablet Take 1 tablet (10 mg total) by mouth daily. Qty: 30 tablet, Refills: 5   Associated Diagnoses: Essential hypertension    atorvastatin (LIPITOR) 40 MG tablet Take 1 tablet (40 mg total) by mouth daily. Qty: 30 tablet, Refills: 10    baclofen (LIORESAL) 20 MG tablet Take 1 tablet (20 mg total) 3 (three) times daily by mouth. Qty: 30 each, Refills: 0    carvedilol (COREG) 6.25 MG tablet Take 2 tablets (12.5 mg total) by mouth 2 (two) times  daily with a meal. Qty: 90 tablet, Refills: 3    cloNIDine (CATAPRES) 0.1 MG tablet Take 1 tablet (0.1 mg total) by mouth 2 (two) times daily. Qty: 180 tablet, Refills: 3   Associated Diagnoses: Essential hypertension    diclofenac sodium (VOLTAREN) 1 % GEL Apply 4 g topically 4 (four) times daily. Qty: 1 Tube, Refills: 5    gabapentin (NEURONTIN) 300 MG capsule Take 2 capsules (600 mg total) by mouth 2 (two) times daily. Qty: 120 capsule, Refills: 5   Associated Diagnoses: Painful diabetic neuropathy (HCC)    Insulin Glargine (LANTUS SOLOSTAR) 100 UNIT/ML Solostar Pen Inject 60 Units into the skin daily at 10 pm. Qty: 30 mL, Refills: 5   Associated Diagnoses: Uncontrolled diabetes mellitus with diabetic nephropathy, with long-term current use of insulin (HCC)    omeprazole (PRILOSEC) 20 MG capsule Take 1 capsule (20 mg total) by mouth daily. Qty: 90 capsule, Refills: 3   Associated Diagnoses: Gastroesophageal reflux disease without esophagitis    valACYclovir (VALTREX) 1000 MG tablet Take 1 tablet (1,000 mg total) 3 (three) times daily by mouth. Qty: 21 tablet, Refills: 0    apixaban (ELIQUIS) 5 MG TABS tablet Take 1 tablet (5 mg total) by mouth 2 (two) times daily. Qty: 60 tablet, Refills: 5    glucose blood test strip Use as instructed Qty: 100 each, Refills: 12    hydrocortisone 2.5 % cream Apply topically 2 (two) times daily. Qty: 30 g, Refills: 5   Associated Diagnoses: Rash    sildenafil (REVATIO) 20 MG tablet Take 20 mg daily if needed for ED Qty: 30 tablet, Refills: 3       Allergies  Allergen Reactions  . Lisinopril Anaphylaxis    Throat swelling   . Angiotensin Receptor Blockers Other (See Comments)    (Angioedema with lisinopril, therefore ARB's are contraindicated)  . Pamelor [Nortriptyline Hcl] Swelling   Follow-up Information    [facility] Follow up on 11/18/2017.   Why:  at 2:00pm for an appointment with Dr Jarold Song. Please  bring your medications with you to the appointment.  Contact information: [address] [place]  [id number] [phone]           The results of significant diagnostics from this hospitalization (including imaging, microbiology, ancillary and laboratory) are listed below for reference.    Significant Diagnostic Studies: Dg Chest 2 View  Result Date: 11/02/2017 CLINICAL DATA:  Acute onset of left-sided neck pain and generalized chest pain. Right ankle swelling. EXAM: CHEST  2 VIEW COMPARISON:  Chest radiograph performed 10/02/2017 FINDINGS: The lungs are well-aerated. Mild bibasilar opacities may reflect atelectasis, given the lack of associated symptoms. Mild vascular congestion is noted. There is no evidence of pleural effusion or pneumothorax. The heart is mildly enlarged. No acute osseous abnormalities are seen. IMPRESSION: Mild vascular congestion and mild cardiomegaly. Bibasilar opacities may reflect atelectasis,  given the lack of associated symptoms. Electronically Signed   By: Garald Balding M.D.   On: 11/02/2017 06:44   Ct Head Wo Contrast  Result Date: 11/06/2017 CLINICAL DATA:  Found down this morning. Complained of severe neck pain on Monday. EXAM: CT HEAD WITHOUT CONTRAST CT CERVICAL SPINE WITHOUT CONTRAST TECHNIQUE: Multidetector CT imaging of the head and cervical spine was performed following the standard protocol without intravenous contrast. Multiplanar CT image reconstructions of the cervical spine were also generated. COMPARISON:  Previous exams including head CT dated 09/18/2016 and CTA neck dated 11/02/2017. FINDINGS: CT HEAD FINDINGS Brain: Ventricles are stable in size and configuration. Mild chronic small vessel ischemic changes noted within the bilateral periventricular white matter regions. There is no mass, hemorrhage, edema or other evidence of acute parenchymal abnormality. No extra-axial hemorrhage. Vascular: There are chronic calcified  atherosclerotic changes of the large vessels at the skull base. No unexpected hyperdense vessel. Skull: Normal. Negative for fracture or focal lesion. Sinuses/Orbits: No acute finding. Other: None. CT CERVICAL SPINE FINDINGS Alignment: Mild straightening of the normal cervical spine lordosis which is likely related to patient positioning. No evidence of acute vertebral body subluxation. Skull base and vertebrae: No fracture line or displaced fracture fragment identified. Soft tissues and spinal canal: No visible canal hematoma. Endotracheal tube and enteric tube in place limiting characterization of the prevertebral soft tissues. Disc levels: Degenerative spurring within the mid cervical spine, with associated disc-osteophytic bulges at the C4-5 and C5-6 levels causing moderate to severe central canal stenoses and neural foramen stenoses with possible associated nerve root impingement. Upper chest: No acute findings. Other: Bilateral carotid atherosclerosis. IMPRESSION: 1. No acute intracranial abnormality. No intracranial mass, hemorrhage or edema. No skull fracture. Mild chronic small vessel ischemic change in the white matter. 2. No fracture or acute subluxation within the cervical spine. 3. Degenerative changes within the mid cervical spine, with associated disc-osteophytic bulges at C4-5 and C5-6 causing moderate to severe central canal stenoses with probable associated nerve root impingement. 4. Carotid atherosclerosis. Electronically Signed   By: Franki Cabot M.D.   On: 11/06/2017 10:13   Ct Angio Neck W And/or Wo Contrast  Result Date: 11/02/2017 CLINICAL DATA:  New onset of severe left-sided neck pain when waking up today. Pain is worse when looking to the left. EXAM: CT ANGIOGRAPHY NECK TECHNIQUE: Multidetector CT imaging of the neck was performed using the standard protocol during bolus administration of intravenous contrast. Multiplanar CT image reconstructions and MIPs were obtained to evaluate  the vascular anatomy. Carotid stenosis measurements (when applicable) are obtained utilizing NASCET criteria, using the distal internal carotid diameter as the denominator. CONTRAST:  35mL ISOVUE-370 IOPAMIDOL (ISOVUE-370) INJECTION 76% COMPARISON:  CT of the cervical spine 10/02/2017. FINDINGS: Aortic arch: A 3 vessel arch configuration is present. Atherosclerotic calcifications are present at the aortic arch without significant stenosis or aneurysm. There is mild irregularity at the origins the great vessels without significant stenosis. Right carotid system: Right common carotid artery demonstrates mild tortuosity. There is no significant stenosis. Atherosclerotic calcifications are present at the right carotid bifurcation without significant stenosis. There is mild tortuosity in the mid left cervical ICA. The left internal carotid artery is within normal limits at the skullbase. Left carotid system: The left common carotid artery is within normal limits. Calcified and noncalcified plaque is present posteriorly in the proximal left ICA without a significant stenosis relative to the more distal vessel. The cervical left ICA is otherwise normal. The left ICA is within  normal limits at the skullbase. Vertebral arteries: The vertebral arteries originate from the subclavian arteries bilaterally without significant stenosis. There is some atherosclerotic calcification at both origins. The vertebral artery is are codominant. No focal lesions are present in the neck. The PICA origins are visualized and normal bilaterally. The vertebrobasilar junction is normal. Skeleton: Mild degenerative changes of the midcervical spine are again seen. There is no acute or healing fracture. Alignment is anatomic. There straightening of the normal cervical lordosis. Other neck: The soft tissues of the neck are otherwise unremarkable. Thyroid is normal. Vocal cords are midline and symmetric. No significant cervical adenopathy is present.  The salivary glands are within normal limits bilaterally. Upper chest: The lung apices demonstrate patchy airspace disease in the right upper lobe. There is mild ground-glass attenuation in both upper lobes. Small effusions are present. IMPRESSION: 1. No acute or focal lesion to explain the patient's left-sided neck pain or pain with turning. 2. Atherosclerotic changes involving the aortic arch and left greater than right carotid bifurcations without significant stenoses. 3. Vascular tortuosity, likely related to hypertension. 4. Patchy airspace disease involving the right upper lobe. This raises concern for atypical pneumonia. Recommend follow-up CT the chest without contrast following appropriate therapy. 5. Ground-glass attenuation the upper lobes bilaterally likely reflects atelectasis or edema. These results were called by telephone at the time of interpretation on 11/02/2017 at 8:33 am to Dr. Duffy Bruce , who verbally acknowledged these results. Electronically Signed   By: San Morelle M.D.   On: 11/02/2017 08:37   Ct Cervical Spine Wo Contrast  Result Date: 11/06/2017 CLINICAL DATA:  Found down this morning. Complained of severe neck pain on Monday. EXAM: CT HEAD WITHOUT CONTRAST CT CERVICAL SPINE WITHOUT CONTRAST TECHNIQUE: Multidetector CT imaging of the head and cervical spine was performed following the standard protocol without intravenous contrast. Multiplanar CT image reconstructions of the cervical spine were also generated. COMPARISON:  Previous exams including head CT dated 09/18/2016 and CTA neck dated 11/02/2017. FINDINGS: CT HEAD FINDINGS Brain: Ventricles are stable in size and configuration. Mild chronic small vessel ischemic changes noted within the bilateral periventricular white matter regions. There is no mass, hemorrhage, edema or other evidence of acute parenchymal abnormality. No extra-axial hemorrhage. Vascular: There are chronic calcified atherosclerotic changes of the  large vessels at the skull base. No unexpected hyperdense vessel. Skull: Normal. Negative for fracture or focal lesion. Sinuses/Orbits: No acute finding. Other: None. CT CERVICAL SPINE FINDINGS Alignment: Mild straightening of the normal cervical spine lordosis which is likely related to patient positioning. No evidence of acute vertebral body subluxation. Skull base and vertebrae: No fracture line or displaced fracture fragment identified. Soft tissues and spinal canal: No visible canal hematoma. Endotracheal tube and enteric tube in place limiting characterization of the prevertebral soft tissues. Disc levels: Degenerative spurring within the mid cervical spine, with associated disc-osteophytic bulges at the C4-5 and C5-6 levels causing moderate to severe central canal stenoses and neural foramen stenoses with possible associated nerve root impingement. Upper chest: No acute findings. Other: Bilateral carotid atherosclerosis. IMPRESSION: 1. No acute intracranial abnormality. No intracranial mass, hemorrhage or edema. No skull fracture. Mild chronic small vessel ischemic change in the white matter. 2. No fracture or acute subluxation within the cervical spine. 3. Degenerative changes within the mid cervical spine, with associated disc-osteophytic bulges at C4-5 and C5-6 causing moderate to severe central canal stenoses with probable associated nerve root impingement. 4. Carotid atherosclerosis. Electronically Signed   By: Franki Cabot  M.D.   On: 11/06/2017 10:13   Mr Jeri Cos IW Contrast  Result Date: 11/07/2017 CLINICAL DATA:  Initial evaluation for acute altered mental status. EXAM: MRI HEAD WITHOUT AND WITH CONTRAST TECHNIQUE: Multiplanar, multiecho pulse sequences of the brain and surrounding structures were obtained without and with intravenous contrast. CONTRAST:  62mL MULTIHANCE GADOBENATE DIMEGLUMINE 529 MG/ML IV SOLN COMPARISON:  [facility] CT from 11/06/2017. FINDINGS: Brain: Mild cerebral volume loss  with chronic small vessel ischemic disease, stable from previous. No abnormal foci of restricted diffusion to suggest acute or subacute ischemia. Gray-white matter differentiation maintained. No evidence for chronic infarction. No evidence for acute or chronic intracranial hemorrhage. No mass lesion, midline shift or mass effect. No hydrocephalus. No extra-axial fluid collection. Major dural sinuses grossly patent. No abnormal enhancement. Subcentimeter T1 hyperintense lesion at the superior aspect of the pituitary gland, possibly a small proteinaceous and/or hemorrhagic cyst, stable from previous, and of doubtful significance. Vascular: Major intravascular flow voids are maintained. Skull and upper cervical spine: Craniocervical junction within normal limits. Mild scattered degenerative spondylolysis noted within the upper cervical spine. Probable mild to moderate stenosis at C4-5. Bone marrow signal intensity within normal limits. No scalp soft tissue abnormality. Sinuses/Orbits: Globes oral soft tissues within normal limits. Scattered mucosal thickening throughout the paranasal sinuses. Small fluid levels noted within the sphenoid sinuses. Fluid seen layering within the nasopharynx. Patient is intubated. Trace bilateral mastoid effusions. Inner ear structures normal. Other: None. IMPRESSION: 1. No acute intracranial abnormality. 2. Mild parenchymal volume loss with chronic small vessel ischemic disease, stable. Electronically Signed   By: Jeannine Boga M.D.   On: 11/07/2017 02:18   Dg Chest Port 1 View  Result Date: 11/10/2017 CLINICAL DATA:  Shortness of breath. EXAM: PORTABLE CHEST 1 VIEW COMPARISON:  11/08/2017 . FINDINGS: Interim extubation and removal of NG tube. Cardiomegaly with normal pulmonary vascularity. Mild left base atelectasis/infiltrate. No prominent pleural effusion. No pneumothorax. IMPRESSION: 1.  Interim extubation removal of NG tube . 2. Low lung volumes with mild left base  atelectasis/infiltrate. 3. Cardiomegaly with normal pulmonary vascularity . Electronically Signed   By: Marcello Moores  Register   On: 11/10/2017 09:16   Dg Chest Port 1 View  Result Date: 11/08/2017 CLINICAL DATA:  Respiratory failure, routine EXAM: PORTABLE CHEST 1 VIEW COMPARISON:  None. FINDINGS: Endotracheal tube is stable at 5 cm above the carina. NG tube courses off the inferior border the film. Mild pulmonary vascular congestion is stable. Pleural fluid or atelectasis along the minor fissure has resolved. A small left pleural effusion and atelectasis remain. Overall aeration is slightly improved. IMPRESSION: 1. Support apparatus is stable. 2. Overall slightly improved aeration. Electronically Signed   By: San Morelle M.D.   On: 11/08/2017 08:14   Dg Chest Port 1 View  Result Date: 11/07/2017 CLINICAL DATA:  Acute respiratory failure with hypoxia. EXAM: PORTABLE CHEST 1 VIEW COMPARISON:  One-view chest x-ray 11/06/2017. FINDINGS: Endotracheal tube is stable, 6.5 cm above the carina. The side port of the NG tube is at the GE junction. The heart is enlarged. There is no significant edema. Small effusions are suspected. Fluid or atelectasis is present along the minor fissure. Mild bibasilar atelectasis is present. No other significant airspace consolidation is present. IMPRESSION: 1. Support apparatus is stable. 2. Increasing small bilateral pleural effusions and associated atelectasis. Electronically Signed   By: San Morelle M.D.   On: 11/07/2017 07:33   Dg Chest Portable 1 View  Result Date: 11/06/2017 CLINICAL DATA:  Intubation.  Nasogastric tube placement at bedside. EXAM: PORTABLE CHEST 1 VIEW 9:16 a.m.: COMPARISON:  Portable chest x-ray earlier today 7:30 a.m. FINDINGS: Endotracheal tube tip in satisfactory position projecting approximately 5 cm above the carina. Nasogastric tube courses below the diaphragm into the stomach though its tip is not included on the image. Cardiac  silhouette mildly to moderately enlarged. Mild pulmonary venous hypertension without overt edema. Mild atelectasis in the lung bases and in the lingula, progressive since earlier this morning. No confluent airspace consolidation. IMPRESSION: 1. Support apparatus satisfactory. Endotracheal tube tip projects approximately 5 cm above the carina. Nasogastric tube courses into the stomach. 2. Mild atelectasis involving both lung bases and the lingula, progressive since earlier this morning. No acute cardiopulmonary disease otherwise. 3. Cardiomegaly.  Pulmonary venous hypertension without overt edema. Electronically Signed   By: Evangeline Dakin M.D.   On: 11/06/2017 09:33   Dg Chest Portable 1 View  Result Date: 11/06/2017 CLINICAL DATA:  Altered mental status EXAM: PORTABLE CHEST 1 VIEW COMPARISON:  November 02, 2017 FINDINGS: Patient is somewhat rotated. There is no edema or consolidation. There is cardiomegaly with pulmonary vascularity within normal limits. No adenopathy. No bone lesions. IMPRESSION: Cardiomegaly.  No edema or consolidation. Electronically Signed   By: Lowella Grip III M.D.   On: 11/06/2017 07:48    Microbiology: Recent Results (from the past 240 hour(s))  Culture, blood (routine x 2)     Status: None (Preliminary result)   Collection Time: 11/06/17  8:03 AM  Result Value Ref Range Status   Specimen Description BLOOD RIGHT FOREARM  Final   Special Requests   Final    BOTTLES DRAWN AEROBIC AND ANAEROBIC Blood Culture adequate volume   Culture NO GROWTH 4 DAYS  Final   Report Status PENDING  Incomplete  Culture, blood (routine x 2)     Status: None (Preliminary result)   Collection Time: 11/06/17  8:05 AM  Result Value Ref Range Status   Specimen Description BLOOD LEFT HAND  Final   Special Requests   Final    BOTTLES DRAWN AEROBIC AND ANAEROBIC Blood Culture adequate volume   Culture NO GROWTH 4 DAYS  Final   Report Status PENDING  Incomplete  Urine culture     Status:  None   Collection Time: 11/06/17  1:41 PM  Result Value Ref Range Status   Specimen Description URINE, CATHETERIZED  Final   Special Requests NONE  Final   Culture NO GROWTH  Final   Report Status 11/08/2017 FINAL  Final  MRSA PCR Screening     Status: None   Collection Time: 11/06/17  4:38 PM  Result Value Ref Range Status   MRSA by PCR NEGATIVE NEGATIVE Final    Comment:        The GeneXpert MRSA Assay (FDA approved for NASAL specimens only), is one component of a comprehensive MRSA colonization surveillance program. It is not intended to diagnose MRSA infection nor to guide or monitor treatment for MRSA infections.      Labs: Basic Metabolic Panel: Recent Labs  Lab 11/07/17 1219 11/07/17 1801  11/08/17 0432 11/09/17 0313 11/09/17 1834 11/10/17 0408 11/11/17 0606  NA  --   --   --  138 140 137 137 135  K  --   --    < > 3.4* 2.7* 3.0* 2.9* 2.8*  CL  --   --   --  106 100* 102 100* 97*  CO2  --   --   --  25 29 26 28 30   GLUCOSE  --   --   --  221* 254* 197* 200* 232*  BUN  --   --   --  19 18 21* 21* 25*  CREATININE  --   --   --  1.39* 1.45* 1.37* 1.42* 1.37*  CALCIUM  --   --   --  8.4* 8.3* 8.2* 8.4* 8.6*  MG 1.9 2.1  --  2.0 1.9  --  1.9  --   PHOS 2.3* 2.3*  --  2.2* 3.4  --  2.5  --    < > = values in this interval not displayed.   Liver Function Tests: Recent Labs  Lab 11/06/17 0635 11/06/17 1245 11/08/17 0432 11/10/17 0408  AST 37 33 27  --   ALT 29 24 19   --   ALKPHOS 84 66 61  --   BILITOT 0.5 0.6 0.5  --   PROT 9.2* 7.3 7.7  --   ALBUMIN 3.3* 2.7* 2.8* 2.8*   Recent Labs  Lab 11/06/17 1245  LIPASE 16   Recent Labs  Lab 11/06/17 0803  AMMONIA 65*   CBC: Recent Labs  Lab 11/06/17 0635 11/06/17 1245 11/07/17 0303 11/08/17 0432 11/09/17 0313 11/10/17 0408  WBC 7.1 6.8 8.7 9.7 11.7* 9.7  NEUTROABS 5.5  --   --   --   --  6.0  HGB 13.6 12.4* 12.8* 13.2 13.0 12.7*  HCT 39.7 36.1* 36.8* 38.8* 37.8* 37.3*  MCV 95.7 94.5 93.4 94.9  95.9 95.6  PLT 216 215 210 180 204 195   Cardiac Enzymes: No results for input(s): CKTOTAL, CKMB, CKMBINDEX, TROPONINI in the last 168 hours. BNP: BNP (last 3 results) Recent Labs    07/08/17 0748 08/05/17 1151 11/06/17 1245  BNP 308.6* 263.7* 335.5*    ProBNP (last 3 results) No results for input(s): PROBNP in the last 8760 hours.  CBG: Recent Labs  Lab 11/10/17 1148 11/10/17 1653 11/10/17 2047 11/11/17 0756 11/11/17 1150  GLUCAP 262* 159* 227* 225* 159*       Signed:  Velvet Bathe MD.  [facility] 11/11/2017, 12:47 PM

## 2017-11-11 NOTE — Telephone Encounter (Signed)
Call received from the patient stating that he is being discharged today.  He noted that he called the [facility] in [facility] and they have beds available tonight but he needs an ID.  Informed him that [facility] can give him a copy of his ID that is on file.  He then inquired if the [facility] will have the white flag out tonight and he can stay there. Informed him that this CM would contact [place]  As well as [employer] placed to [facility] [place]. She stated that [employer] is at [facility], and will not have any availability tonight. She suggested that he try [facility].She noted that the patient can meet with her at the [facility] on Monday, 11/16/17 or Don Perking, RN/IRC to discuss shelter status/housing  at that time. As per [name], the city should be opening more emergency housing on 12/07/17.   Call placed to [facility]. Spoke to [employer] who stated that there will not be a white flag tonight at [facility] and she had no other shelter options for the patient.   Call placed to the patient.  Informed him of the status at [facility] and no white flag at [facility]. He is considering going to the shelter in [facility] and also considering calling a friend in [place] to see if he can stay with him.  He also said that he spoke to [facility] at [facility] and she said that she could get him bus passes to get to [place].  Also explained to him that this CM could give him bus pass for [employer].

## 2017-11-11 NOTE — Progress Notes (Signed)
Pt discharging home with self care. CM received call from [name] at [facility] about patient going to a shelter in [place] today. CM called the shelter and they do have a space for him. CM went to see the patient and he states his pastor is going to put him in a Motel. Agricultural consultant on [place] and [name] with [facility] updated. Pt has his medications in the downstairs pharmacy of the hospital. CM informed charge RN that these need to be picked up for him prior to d/c.  Per patient his pastor is going to provide transportation to the motel.

## 2017-11-15 ENCOUNTER — Emergency Department (HOSPITAL_COMMUNITY): Payer: Medicaid Other

## 2017-11-15 ENCOUNTER — Emergency Department (HOSPITAL_COMMUNITY)
Admission: EM | Admit: 2017-11-15 | Discharge: 2017-11-15 | Disposition: A | Discharge status: Home / Self Care | Payer: Medicaid Other | Attending: Emergency Medicine | Admitting: Emergency Medicine

## 2017-11-15 ENCOUNTER — Encounter (HOSPITAL_COMMUNITY): Payer: Self-pay | Admitting: Emergency Medicine

## 2017-11-15 DIAGNOSIS — Z794 Long term (current) use of insulin: Secondary | ICD-10-CM | POA: Diagnosis not present

## 2017-11-15 DIAGNOSIS — Z7901 Long term (current) use of anticoagulants: Secondary | ICD-10-CM | POA: Insufficient documentation

## 2017-11-15 DIAGNOSIS — E1122 Type 2 diabetes mellitus with diabetic chronic kidney disease: Secondary | ICD-10-CM | POA: Diagnosis not present

## 2017-11-15 DIAGNOSIS — Z8546 Personal history of malignant neoplasm of prostate: Secondary | ICD-10-CM | POA: Diagnosis not present

## 2017-11-15 DIAGNOSIS — N183 Chronic kidney disease, stage 3 (moderate): Secondary | ICD-10-CM | POA: Insufficient documentation

## 2017-11-15 DIAGNOSIS — I2510 Atherosclerotic heart disease of native coronary artery without angina pectoris: Secondary | ICD-10-CM | POA: Insufficient documentation

## 2017-11-15 DIAGNOSIS — R109 Unspecified abdominal pain: Secondary | ICD-10-CM | POA: Diagnosis present

## 2017-11-15 DIAGNOSIS — Z79899 Other long term (current) drug therapy: Secondary | ICD-10-CM | POA: Insufficient documentation

## 2017-11-15 DIAGNOSIS — R1084 Generalized abdominal pain: Secondary | ICD-10-CM | POA: Insufficient documentation

## 2017-11-15 DIAGNOSIS — I5032 Chronic diastolic (congestive) heart failure: Secondary | ICD-10-CM | POA: Diagnosis not present

## 2017-11-15 DIAGNOSIS — I130 Hypertensive heart and chronic kidney disease with heart failure and stage 1 through stage 4 chronic kidney disease, or unspecified chronic kidney disease: Secondary | ICD-10-CM | POA: Insufficient documentation

## 2017-11-15 DIAGNOSIS — F17200 Nicotine dependence, unspecified, uncomplicated: Secondary | ICD-10-CM | POA: Insufficient documentation

## 2017-11-15 LAB — URINALYSIS, ROUTINE W REFLEX MICROSCOPIC
Bilirubin Urine: NEGATIVE
Glucose, UA: 150 mg/dL — AB
Ketones, ur: NEGATIVE mg/dL
Leukocytes, UA: NEGATIVE
Nitrite: NEGATIVE
Protein, ur: 100 mg/dL — AB
Specific Gravity, Urine: 1.006 (ref 1.005–1.030)
Squamous Epithelial / LPF: NONE SEEN
pH: 5 (ref 5.0–8.0)

## 2017-11-15 LAB — COMPREHENSIVE METABOLIC PANEL
ALT: 55 U/L (ref 17–63)
AST: 34 U/L (ref 15–41)
Albumin: 3.4 g/dL — ABNORMAL LOW (ref 3.5–5.0)
Alkaline Phosphatase: 78 U/L (ref 38–126)
Anion gap: 11 (ref 5–15)
BUN: 16 mg/dL (ref 6–20)
CO2: 24 mmol/L (ref 22–32)
Calcium: 9 mg/dL (ref 8.9–10.3)
Chloride: 100 mmol/L — ABNORMAL LOW (ref 101–111)
Creatinine, Ser: 1.26 mg/dL — ABNORMAL HIGH (ref 0.61–1.24)
GFR calc Af Amer: >60 mL/min (ref >60)
GFR calc non Af Amer: >60 mL/min (ref >60)
Glucose, Bld: 245 mg/dL — ABNORMAL HIGH (ref 65–99)
Potassium: 3.5 mmol/L (ref 3.5–5.1)
Sodium: 135 mmol/L (ref 135–145)
Total Bilirubin: 0.7 mg/dL (ref 0.3–1.2)
Total Protein: 8.6 g/dL — ABNORMAL HIGH (ref 6.5–8.1)

## 2017-11-15 LAB — CBC
HCT: 41.2 % (ref 39.0–52.0)
Hemoglobin: 14.8 g/dL (ref 13.0–17.0)
MCH: 33.4 pg (ref 26.0–34.0)
MCHC: 35.9 g/dL (ref 30.0–36.0)
MCV: 93 fL (ref 78.0–100.0)
Platelets: 254 10*3/uL (ref 150–400)
RBC: 4.43 MIL/uL (ref 4.22–5.81)
RDW: 12.3 % (ref 11.5–15.5)
WBC: 8.5 10*3/uL (ref 4.0–10.5)

## 2017-11-15 LAB — LIPASE, BLOOD: Lipase: 81 U/L — ABNORMAL HIGH (ref 11–51)

## 2017-11-15 MED ORDER — SUCRALFATE 1 G PO TABS: 1.0000 g | ORAL_TABLET | Freq: Three times a day (TID) | ORAL | 0 refills | Status: DC

## 2017-11-15 MED ORDER — GI COCKTAIL ~~LOC~~
30.0000 mL | Freq: Once | ORAL | Status: AC
  Administered 2017-11-15: 30 mL via ORAL
  Filled 2017-11-15: qty 30

## 2017-11-15 NOTE — ED Triage Notes (Signed)
Reports being discharged from hospital on Wednesday.  Has not had a bm since being discharged.  C/o mid abdominal pain that started yesterday.  Also c/o head congestion.

## 2017-11-15 NOTE — ED Notes (Signed)
Pt stating he is very upset he is being discharged because he "doesn't understand why they cant find anything wrong with him." Pt refusing to sign for paperwork at this time, d/c papers explained to pt, given bus pass and escorted to lobby via wheelchair.

## 2017-11-15 NOTE — Discharge Instructions (Signed)
Your lab and x-ray studies are all normal. No evidence of infection, bleeding or obstruction. You can be discharged home and should take your regular medications including the new prescription for symptoms called sucralfate. Follow up with your doctor for recheck this week.
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
## 2017-11-16 ENCOUNTER — Telehealth: Payer: Self-pay

## 2017-11-16 NOTE — Telephone Encounter (Signed)
[facility] Post-discharge Follow-Up Phone Call:  Date of Discharge:  11/11/2017 Principal Discharge Diagnosis(es):  CHF, uncontrolled DM, HTN  Post-discharge Communication: (Clearly document all attempts clearly and date contact made) call placed to [phone] (M) and a HIPAA compliant voicemail message was left requesting a call back to # [phone].   Call Completed: No

## 2017-11-17 ENCOUNTER — Inpatient Hospital Stay: Payer: Self-pay

## 2017-11-17 ENCOUNTER — Telehealth: Payer: Self-pay

## 2017-11-17 NOTE — Telephone Encounter (Signed)
[facility] Post-discharge Follow-Up Phone Call:  Date of Discharge:  11/11/2017 Principal Discharge Diagnosis(es):  CHF, uncontrolled DM, HTN  Post-discharge Communication: (Clearly document all attempts clearly and date contact made) call placed to [phone] (M) and a HIPAA compliant voicemail message was left requesting a call back to # [phone].   Call Completed: No

## 2017-11-18 ENCOUNTER — Inpatient Hospital Stay: Payer: Self-pay | Admitting: Family Medicine

## 2017-11-24 ENCOUNTER — Telehealth: Payer: Self-pay

## 2017-11-24 NOTE — Telephone Encounter (Signed)
Call placed to [facility] [place] to inquire if the patient has been staying there. She stated that he has not been there but she will reach out to the other congregational nurses and ask them to have the patient call [facility] if they see him.

## 2017-11-24 NOTE — Telephone Encounter (Signed)
[facility] Post-discharge Follow-Up Phone Call: Call placed to check on the patient and to discuss scheduling a follow up appointment with Dr Jarold Song at [facility]  Date of Discharge: 11/21/2018Principal Discharge Diagnosis(es):  CHF, uncontrolled DM, HTN Post-discharge Communication: (Clearly document all attempts clearly and date contact made) call placed to # [phone] (M) and it sounded as if the phone was answered after a couple of rings, but then no one answered. Call Completed: No  He was also seen in the ED on 11/15/17 for complaint of abdominal pain.

## 2017-12-04 ENCOUNTER — Telehealth: Payer: Self-pay

## 2017-12-04 NOTE — Telephone Encounter (Signed)
Spoke to the patient yesterday evening and reminded him that he needs to schedule a follow up appointment with Dr Jarold Song. He stated that he is currently staying with a friend but it is only temporary. He noted that he had his disability hearing and the attorney was supposed to submit additional documentation by 12/03/17 . The disability decision is pending.

## 2017-12-16 ENCOUNTER — Emergency Department (HOSPITAL_COMMUNITY): Payer: Medicaid Other

## 2017-12-16 ENCOUNTER — Encounter (HOSPITAL_COMMUNITY): Payer: Self-pay

## 2017-12-16 ENCOUNTER — Other Ambulatory Visit: Payer: Self-pay

## 2017-12-16 ENCOUNTER — Emergency Department (HOSPITAL_COMMUNITY)
Admission: EM | Admit: 2017-12-16 | Discharge: 2017-12-16 | Disposition: A | Discharge status: Home / Self Care | Payer: Medicaid Other | Attending: Emergency Medicine | Admitting: Emergency Medicine

## 2017-12-16 DIAGNOSIS — I130 Hypertensive heart and chronic kidney disease with heart failure and stage 1 through stage 4 chronic kidney disease, or unspecified chronic kidney disease: Secondary | ICD-10-CM | POA: Insufficient documentation

## 2017-12-16 DIAGNOSIS — R200 Anesthesia of skin: Secondary | ICD-10-CM | POA: Diagnosis not present

## 2017-12-16 DIAGNOSIS — I5032 Chronic diastolic (congestive) heart failure: Secondary | ICD-10-CM | POA: Insufficient documentation

## 2017-12-16 DIAGNOSIS — E876 Hypokalemia: Secondary | ICD-10-CM | POA: Diagnosis present

## 2017-12-16 DIAGNOSIS — F17200 Nicotine dependence, unspecified, uncomplicated: Secondary | ICD-10-CM | POA: Diagnosis not present

## 2017-12-16 DIAGNOSIS — E1122 Type 2 diabetes mellitus with diabetic chronic kidney disease: Secondary | ICD-10-CM | POA: Insufficient documentation

## 2017-12-16 DIAGNOSIS — E1140 Type 2 diabetes mellitus with diabetic neuropathy, unspecified: Secondary | ICD-10-CM | POA: Insufficient documentation

## 2017-12-16 DIAGNOSIS — Z8546 Personal history of malignant neoplasm of prostate: Secondary | ICD-10-CM | POA: Diagnosis not present

## 2017-12-16 DIAGNOSIS — R0789 Other chest pain: Secondary | ICD-10-CM | POA: Diagnosis not present

## 2017-12-16 DIAGNOSIS — R748 Abnormal levels of other serum enzymes: Secondary | ICD-10-CM

## 2017-12-16 DIAGNOSIS — R778 Other specified abnormalities of plasma proteins: Secondary | ICD-10-CM | POA: Diagnosis present

## 2017-12-16 DIAGNOSIS — I2510 Atherosclerotic heart disease of native coronary artery without angina pectoris: Secondary | ICD-10-CM

## 2017-12-16 DIAGNOSIS — E119 Type 2 diabetes mellitus without complications: Secondary | ICD-10-CM

## 2017-12-16 DIAGNOSIS — Z7901 Long term (current) use of anticoagulants: Secondary | ICD-10-CM | POA: Insufficient documentation

## 2017-12-16 DIAGNOSIS — R079 Chest pain, unspecified: Secondary | ICD-10-CM

## 2017-12-16 DIAGNOSIS — R071 Chest pain on breathing: Secondary | ICD-10-CM | POA: Diagnosis present

## 2017-12-16 DIAGNOSIS — Z79899 Other long term (current) drug therapy: Secondary | ICD-10-CM | POA: Diagnosis not present

## 2017-12-16 DIAGNOSIS — E118 Type 2 diabetes mellitus with unspecified complications: Secondary | ICD-10-CM

## 2017-12-16 DIAGNOSIS — R7989 Other specified abnormal findings of blood chemistry: Secondary | ICD-10-CM | POA: Diagnosis present

## 2017-12-16 DIAGNOSIS — R112 Nausea with vomiting, unspecified: Secondary | ICD-10-CM | POA: Diagnosis not present

## 2017-12-16 DIAGNOSIS — F1910 Other psychoactive substance abuse, uncomplicated: Secondary | ICD-10-CM | POA: Insufficient documentation

## 2017-12-16 DIAGNOSIS — Z794 Long term (current) use of insulin: Secondary | ICD-10-CM | POA: Diagnosis not present

## 2017-12-16 DIAGNOSIS — N183 Chronic kidney disease, stage 3 (moderate): Secondary | ICD-10-CM | POA: Insufficient documentation

## 2017-12-16 LAB — CBC WITH DIFFERENTIAL/PLATELET
Basophils Absolute: 0 10*3/uL (ref 0.0–0.1)
Basophils Relative: 0 %
Eosinophils Absolute: 0.1 10*3/uL (ref 0.0–0.7)
Eosinophils Relative: 2 %
HCT: 42.3 % (ref 39.0–52.0)
Hemoglobin: 15 g/dL (ref 13.0–17.0)
Lymphocytes Relative: 30 %
Lymphs Abs: 2.4 10*3/uL (ref 0.7–4.0)
MCH: 32.5 pg (ref 26.0–34.0)
MCHC: 35.5 g/dL (ref 30.0–36.0)
MCV: 91.8 fL (ref 78.0–100.0)
Monocytes Absolute: 0.6 10*3/uL (ref 0.1–1.0)
Monocytes Relative: 7 %
Neutro Abs: 4.9 10*3/uL (ref 1.7–7.7)
Neutrophils Relative %: 61 %
Platelets: 184 10*3/uL (ref 150–400)
RBC: 4.61 MIL/uL (ref 4.22–5.81)
RDW: 11.8 % (ref 11.5–15.5)
WBC: 8.1 10*3/uL (ref 4.0–10.5)

## 2017-12-16 LAB — COMPREHENSIVE METABOLIC PANEL
ALT: 31 U/L (ref 17–63)
AST: 29 U/L (ref 15–41)
Albumin: 3.4 g/dL — ABNORMAL LOW (ref 3.5–5.0)
Alkaline Phosphatase: 95 U/L (ref 38–126)
Anion gap: 10 (ref 5–15)
BUN: 13 mg/dL (ref 6–20)
CO2: 31 mmol/L (ref 22–32)
Calcium: 8.8 mg/dL — ABNORMAL LOW (ref 8.9–10.3)
Chloride: 91 mmol/L — ABNORMAL LOW (ref 101–111)
Creatinine, Ser: 1.72 mg/dL — ABNORMAL HIGH (ref 0.61–1.24)
GFR calc Af Amer: 49 mL/min — ABNORMAL LOW (ref >60)
GFR calc non Af Amer: 42 mL/min — ABNORMAL LOW (ref >60)
Glucose, Bld: 443 mg/dL — ABNORMAL HIGH (ref 65–99)
Potassium: 3.3 mmol/L — ABNORMAL LOW (ref 3.5–5.1)
Sodium: 132 mmol/L — ABNORMAL LOW (ref 135–145)
Total Bilirubin: 1.3 mg/dL — ABNORMAL HIGH (ref 0.3–1.2)
Total Protein: 8.4 g/dL — ABNORMAL HIGH (ref 6.5–8.1)

## 2017-12-16 LAB — LIPASE, BLOOD: Lipase: 42 U/L (ref 11–51)

## 2017-12-16 LAB — CBG MONITORING, ED
Glucose-Capillary: 344 mg/dL — ABNORMAL HIGH (ref 65–99)
Glucose-Capillary: 267 mg/dL — ABNORMAL HIGH (ref 65–99)

## 2017-12-16 LAB — I-STAT TROPONIN, ED: Troponin i, poc: 0.06 ng/mL (ref 0.00–0.08)

## 2017-12-16 LAB — TROPONIN I: Troponin I: 0.07 ng/mL (ref <0.03)

## 2017-12-16 MED ORDER — ACETAMINOPHEN 325 MG PO TABS
650.0000 mg | ORAL_TABLET | Freq: Once | ORAL | Status: AC
  Administered 2017-12-16: 650 mg via ORAL
  Filled 2017-12-16: qty 2

## 2017-12-16 MED ORDER — INSULIN ASPART 100 UNIT/ML ~~LOC~~ SOLN
6.0000 [IU] | Freq: Once | SUBCUTANEOUS | Status: AC
  Administered 2017-12-16: 6 [IU] via SUBCUTANEOUS
  Filled 2017-12-16: qty 1

## 2017-12-16 MED ORDER — ONDANSETRON HCL 4 MG/2ML IJ SOLN
4.0000 mg | Freq: Once | INTRAMUSCULAR | Status: AC
  Administered 2017-12-16: 4 mg via INTRAVENOUS
  Filled 2017-12-16: qty 2

## 2017-12-16 MED ORDER — INSULIN ASPART 100 UNIT/ML ~~LOC~~ SOLN
10.0000 [IU] | Freq: Once | SUBCUTANEOUS | Status: AC
  Administered 2017-12-16: 10 [IU] via SUBCUTANEOUS
  Filled 2017-12-16: qty 1

## 2017-12-16 MED ORDER — ONDANSETRON HCL 4 MG PO TABS: 4.0000 mg | ORAL_TABLET | Freq: Four times a day (QID) | ORAL | 0 refills | Status: DC

## 2017-12-16 MED ORDER — IOPAMIDOL (ISOVUE-370) INJECTION 76%
INTRAVENOUS | Status: AC
  Administered 2017-12-16: 80 mL
  Filled 2017-12-16: qty 100

## 2017-12-16 MED ORDER — SODIUM CHLORIDE 0.9 % IV BOLUS (SEPSIS)
1000.0000 mL | Freq: Once | INTRAVENOUS | Status: AC
  Administered 2017-12-16: 1000 mL via INTRAVENOUS

## 2017-12-16 NOTE — ED Triage Notes (Signed)
Pt arrives by [facility] for chest pain that started yesterday while sitting. Ems gave 2 nitro tablets/ 4mg  zofran, 500cc NS, 324 mg ASA.

## 2017-12-16 NOTE — ED Notes (Signed)
Patient transported to CT 
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
## 2017-12-16 NOTE — ED Provider Notes (Signed)
Planes of left anterior chest pain  with numbness in his left arm onset yesterday, constant, nonexertional.  Pain is pleuritic in nature worse with deep inspiration not improved by anything.  He was treated by EMS with aspirin and 3 sublingual nitroglycerin without relief.  Pain is been constant since yesterday.  No other associated symptoms on exam alert no distress lungs clear to auscultation heart regular rate and rhythm abdomen obese nontender extremities without edema.  Radial pulses 2+ bilaterally.  DP pulses 2+ bilaterally   Orlie Dakin, MD 12/16/17 1754

## 2017-12-16 NOTE — Discharge Instructions (Signed)
Please read attached information. If you experience any new or worsening signs or symptoms please return to the emergency room for evaluation. Please follow-up with your primary care provider or specialist as discussed. Please use medication prescribed only as directed and discontinue taking if you have any concerning signs or symptoms.   °

## 2017-12-16 NOTE — ED Notes (Signed)
Cards at the bedside

## 2017-12-16 NOTE — ED Notes (Signed)
ED Provider at bedside. 
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
## 2017-12-17 ENCOUNTER — Telehealth: Payer: Self-pay | Admitting: Family Medicine

## 2017-12-17 NOTE — Telephone Encounter (Signed)
I will see him at his follow-up visit.  If he continues to feel unwell he would need to return to the emergency room.

## 2017-12-17 NOTE — Telephone Encounter (Signed)
Call placed to [phone], patient to check on his status following his discharge. Patient stated that he wasn't doing so well. Patient was upset that he was discharge considering that he continues to experience pain in his chest and vomiting. Patient knows that there is something wrong with his body and is upset that he isn't receiving the help he needs. He said " last time I was sick the same thing happened, no one listened to me the first time and I ended in the hospital again." Advised patient that if pain continue to go back. I expressed to patient that his health is important and that I would let provider know. I also informed him that I will check on him tomorrow to see how he is doing and schedule an appointment. Pt understood and had no other questions or concerns.

## 2017-12-18 NOTE — Telephone Encounter (Signed)
Call placed to patient [phone] to check on his status and inform him of what provider had stated. No answer. Left patient a message asking him to return my call at [phone].

## 2017-12-26 IMAGING — US US ABDOMEN LIMITED
1 series · 14 of 25 positions shown · non-contrast
Comparison: Abdominal ultrasound performed 04/26/2012

CLINICAL DATA: Acute onset of right upper quadrant abdominal pain.
Initial encounter.

EXAM:
US ABDOMEN LIMITED - RIGHT UPPER QUADRANT

[Series 1: us abdomen limited · 0.28mm/px · 14 of 35 slices shown]
[im 1/35]
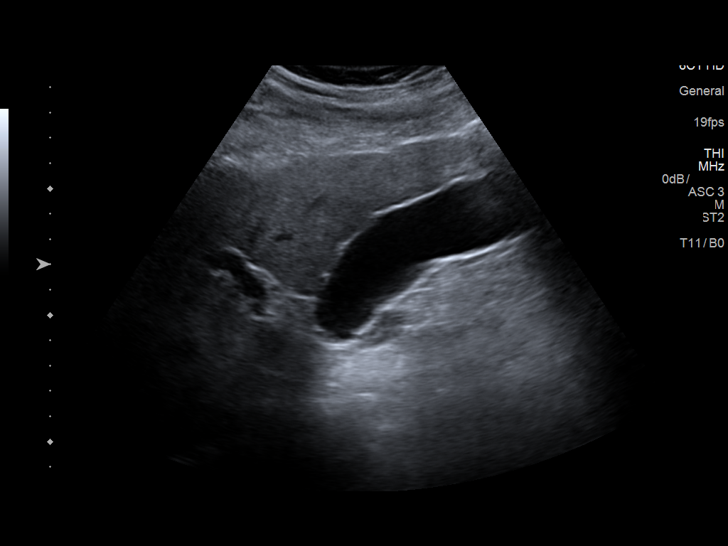
[im 3/35]
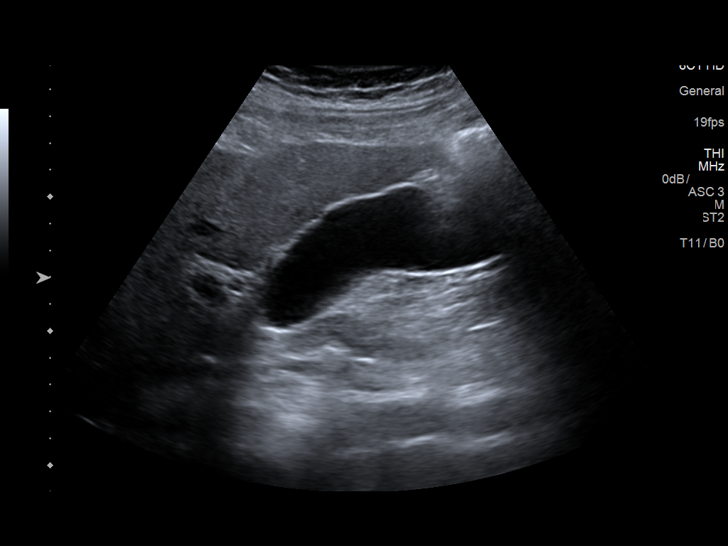
[im 6/35]
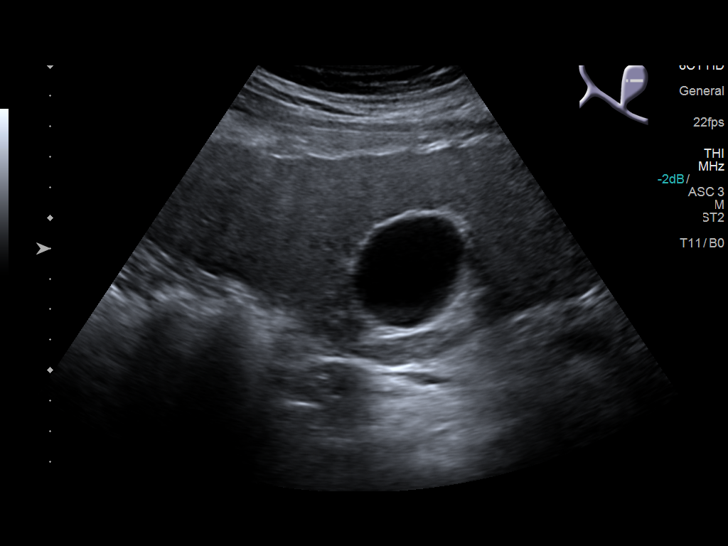
[im 9/35]
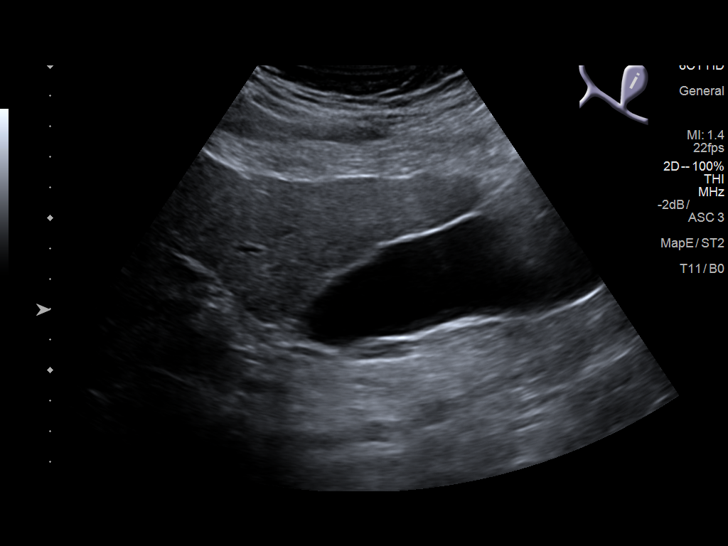
[im 12/35]
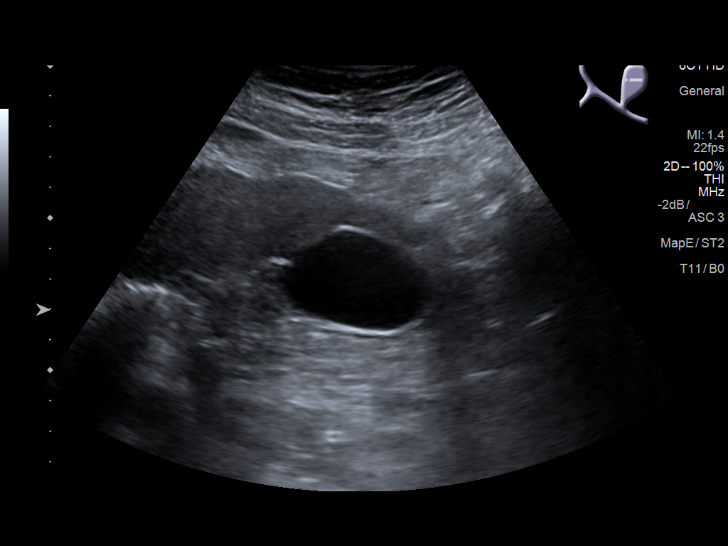
[im 13/35]
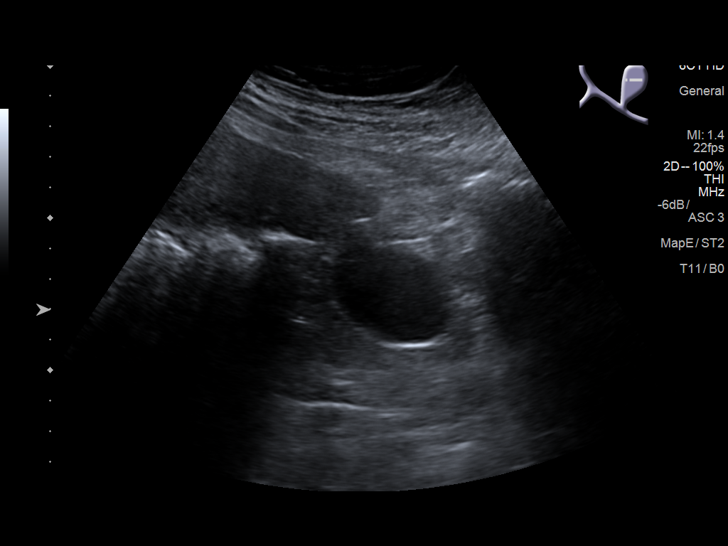
[im 16/35]
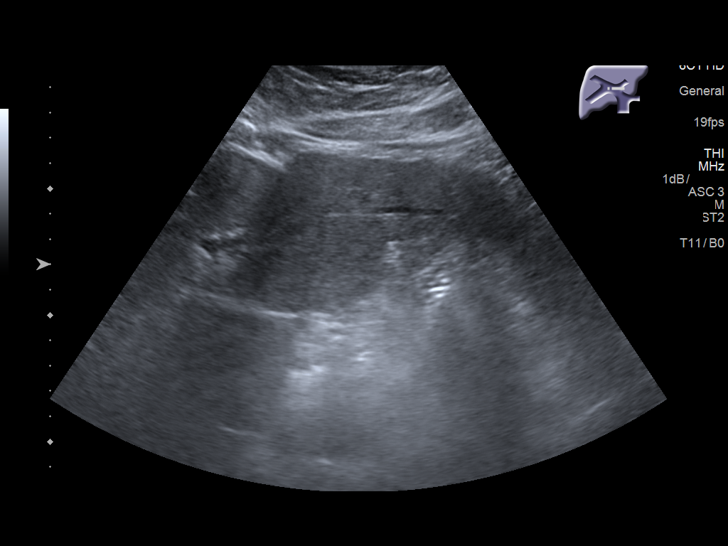
[im 19/35]
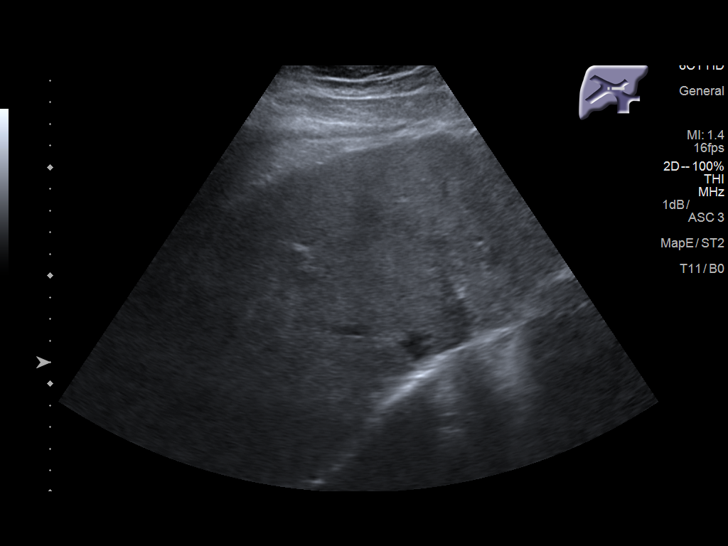
[im 22/35]
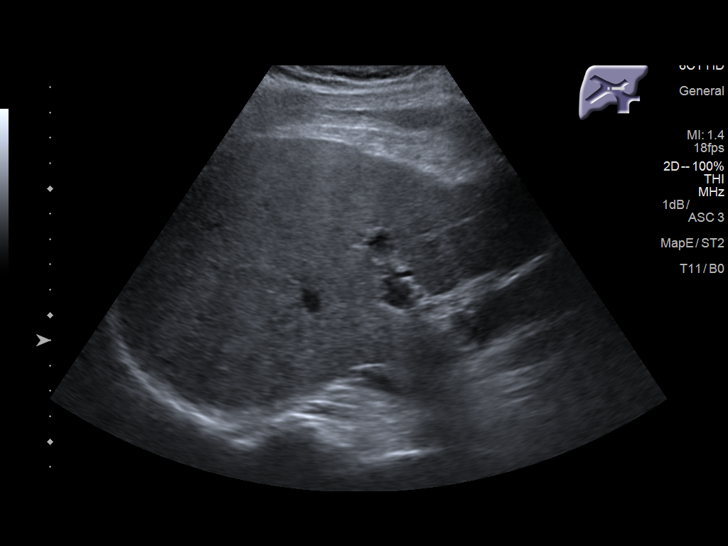
[im 23/35]
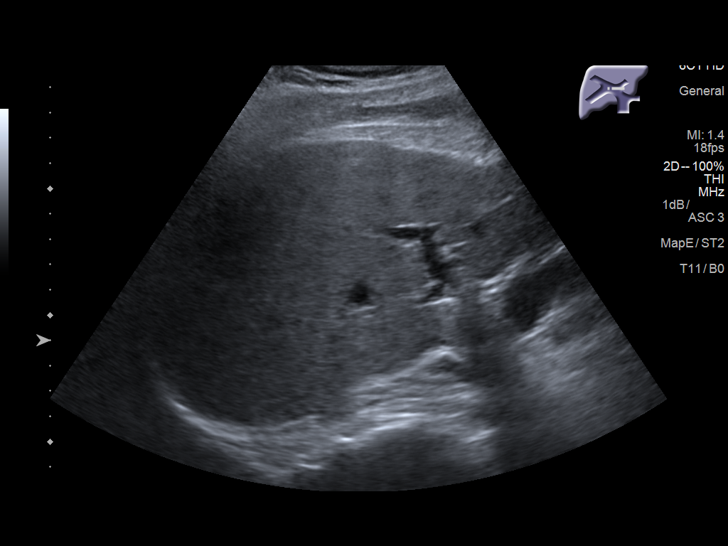
[im 26/35]
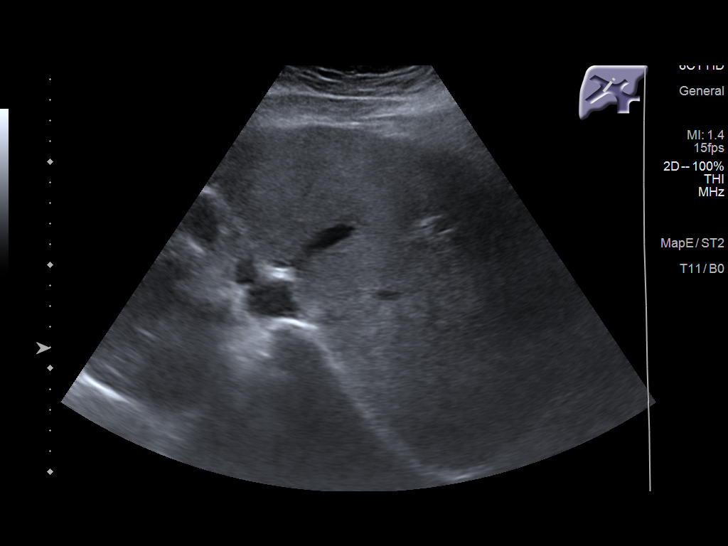
[im 29/35]
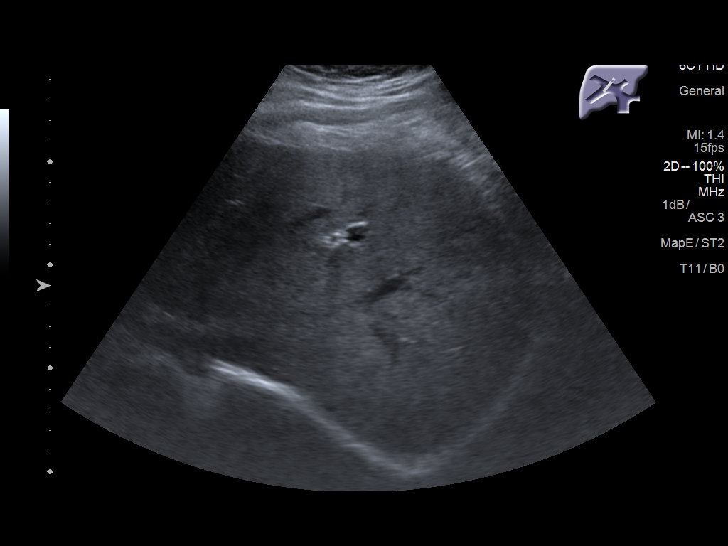
[im 32/35]
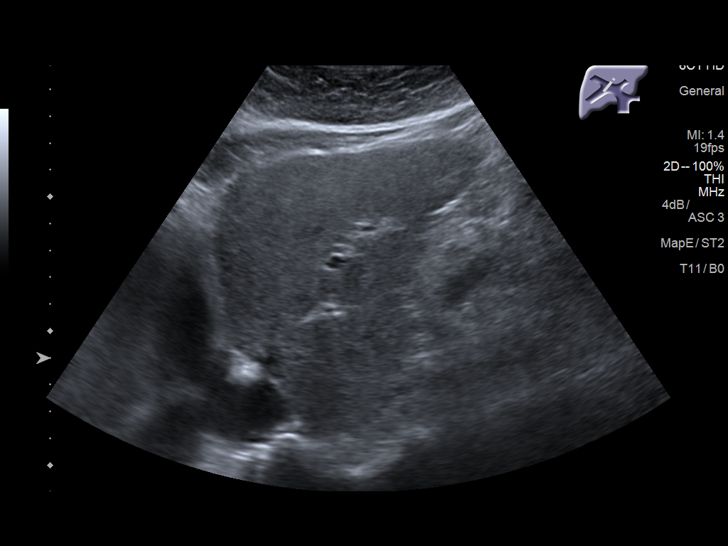
[im 35/35]
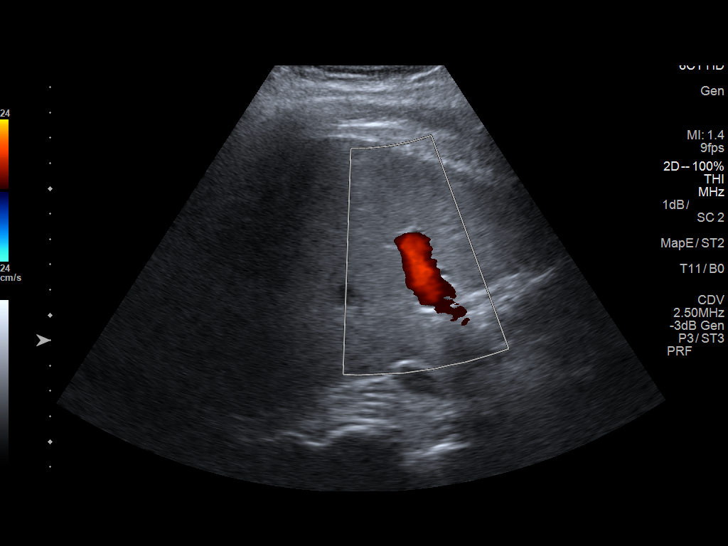

[14 of 25 positions shown; findings below may reference images not displayed]

FINDINGS: Gallbladder:

No gallstones or wall thickening visualized. No sonographic Murphy
sign noted by sonographer.

Common bile duct:

Diameter: 0.3 cm, within normal limits in caliber.

Liver:

No focal lesion identified. The mildly nodular contour of the liver
raises question for mild hepatic cirrhosis. Parenchymal echogenicity
is mildly heterogeneous.
IMPRESSION: 1. No acute abnormality seen at the right upper quadrant.
2. Mildly nodular contour of the liver raises question for mild
hepatic cirrhosis. Would correlate with LFTs.

## 2018-01-02 IMAGING — US US RENAL
1 series · 14 of 25 positions shown · non-contrast
Comparison: 06/26/2016

CLINICAL DATA: Elevated creatinine, acute kidney injury.

EXAM:
RENAL / URINARY TRACT ULTRASOUND COMPLETE

[Series 1: us renal · 0.26mm/px · 14 of 34 slices shown]
[im 1/34]
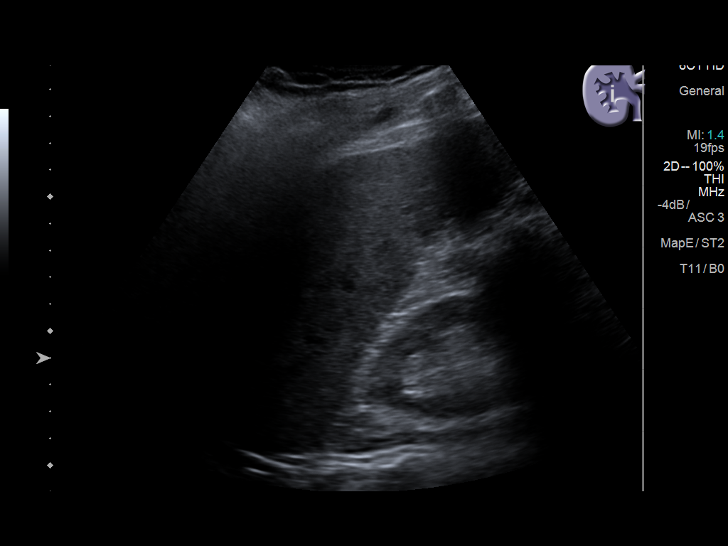
[im 3/34]
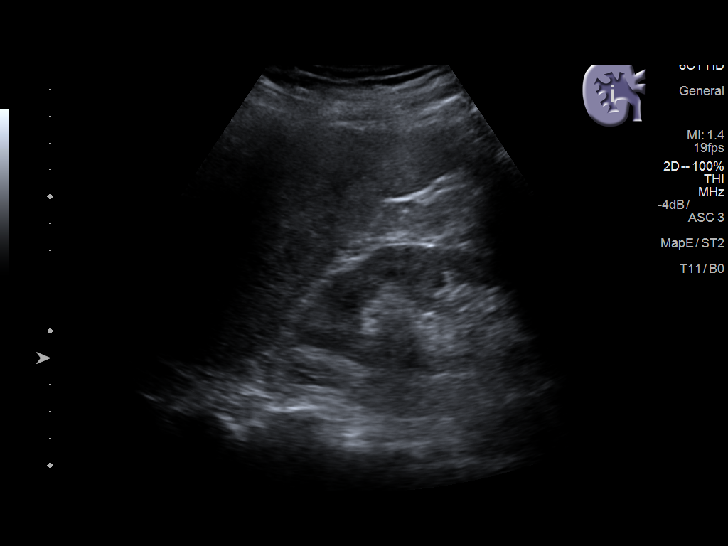
[im 6/34]
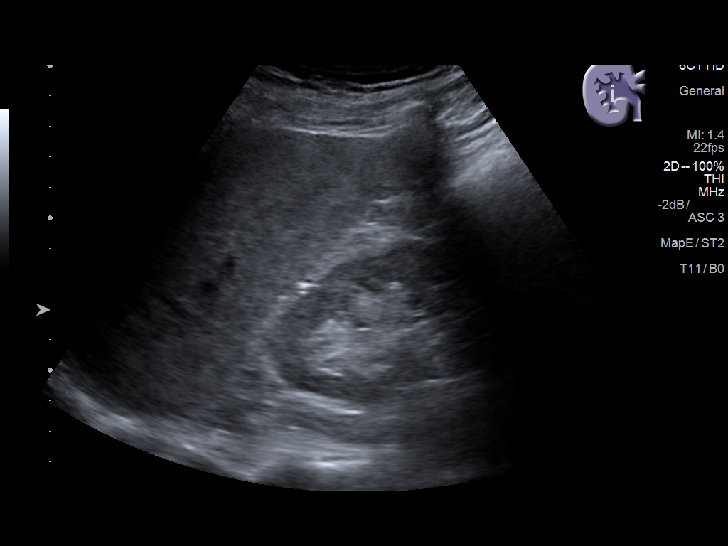
[im 9/34]
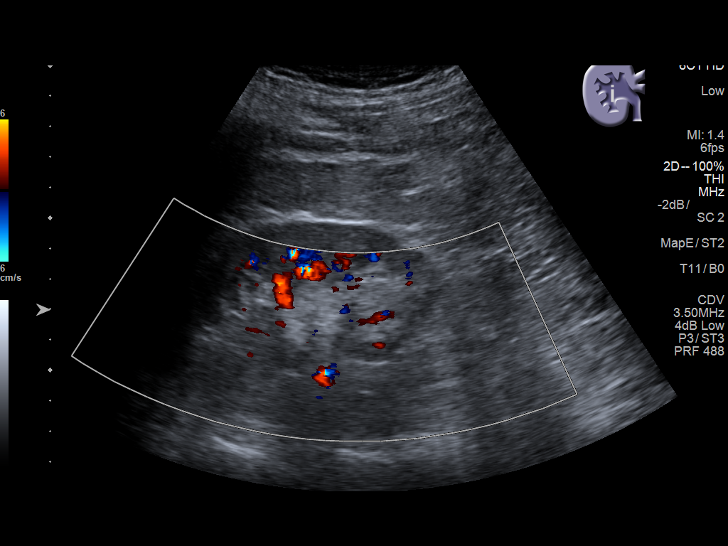
[im 12/34]
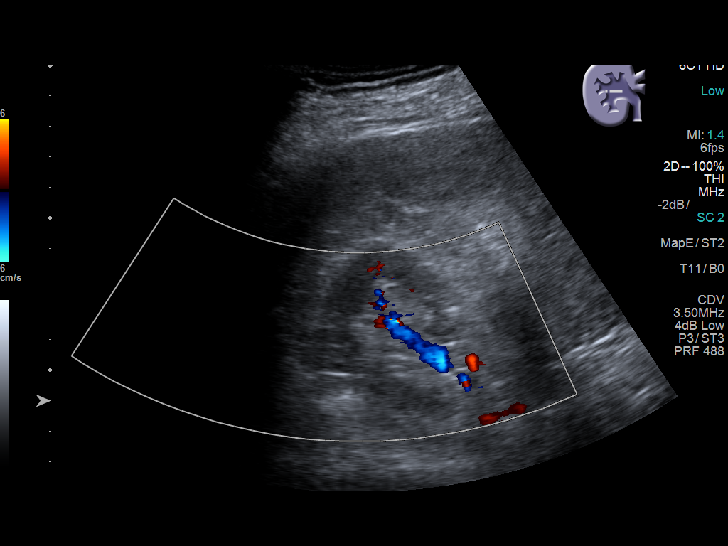
[im 13/34]
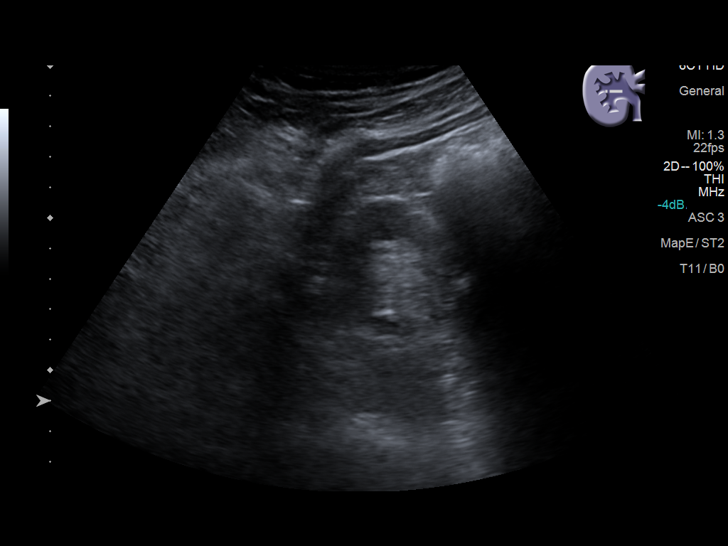
[im 16/34]
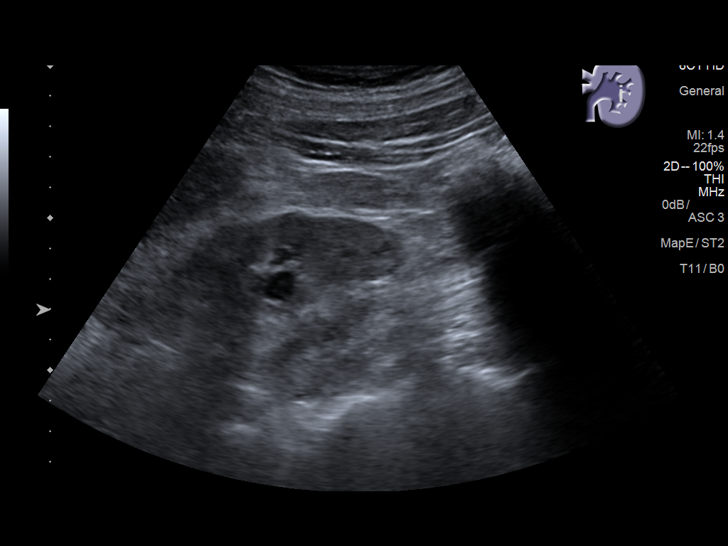
[im 18/34]
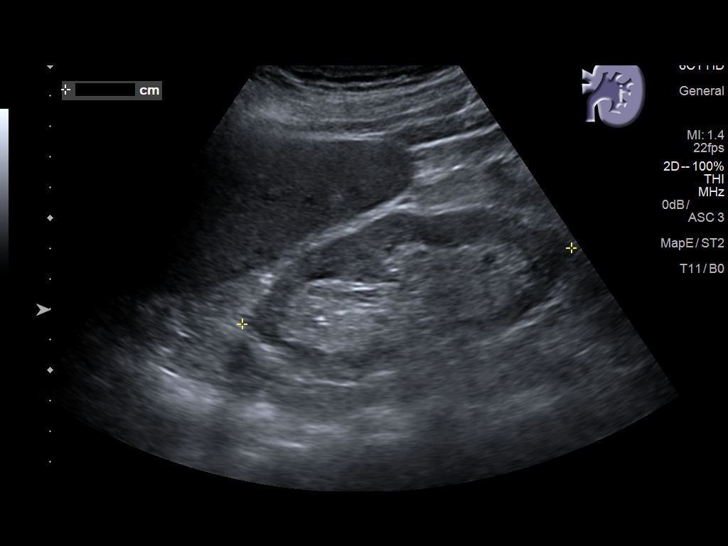
[im 21/34]
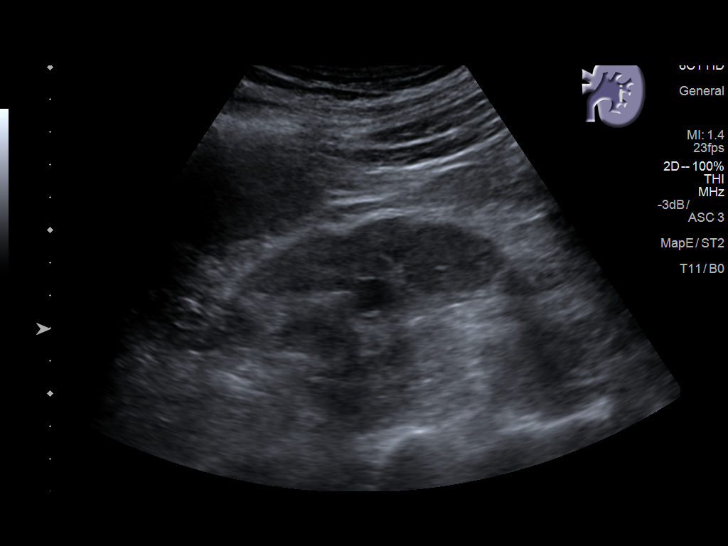
[im 23/34]
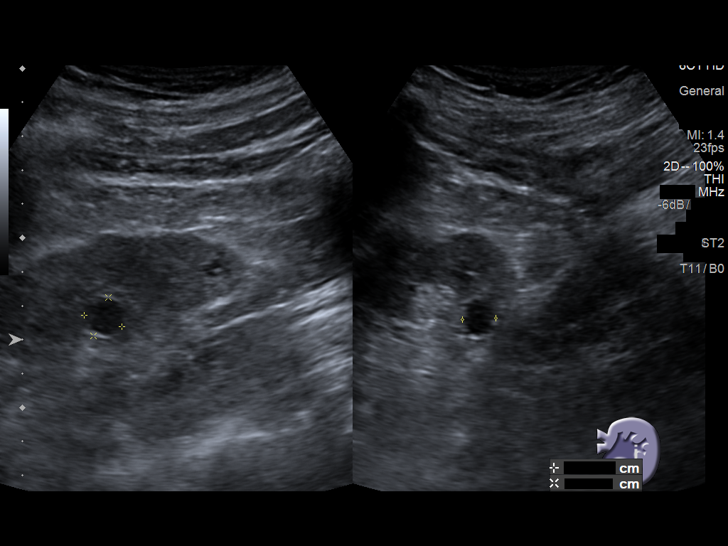
[im 25/34]
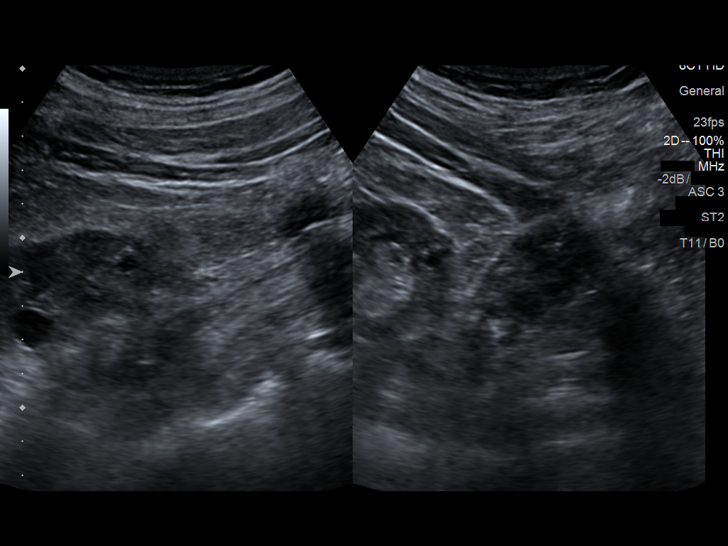
[im 28/34]
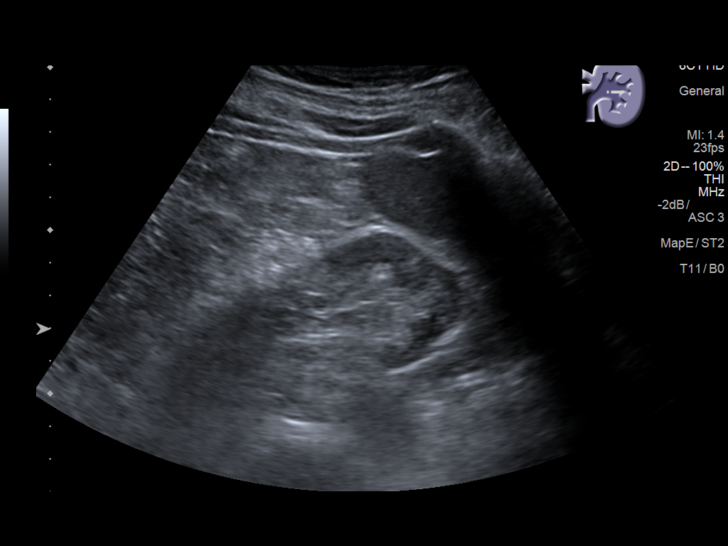
[im 31/34]
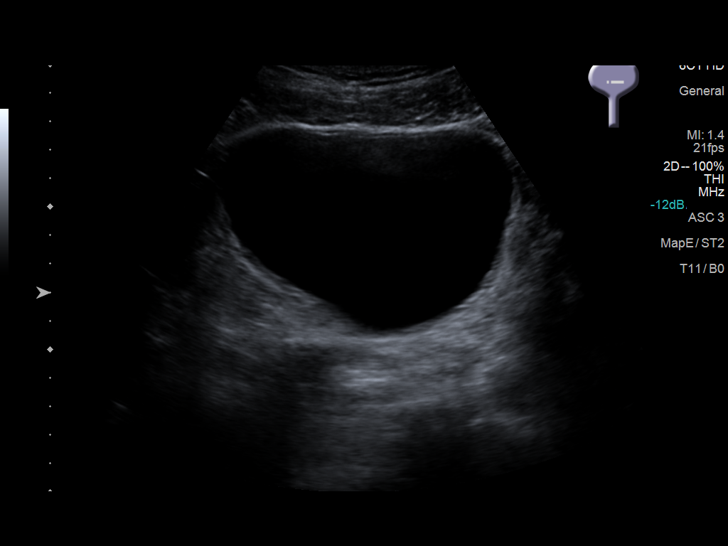
[im 34/34]
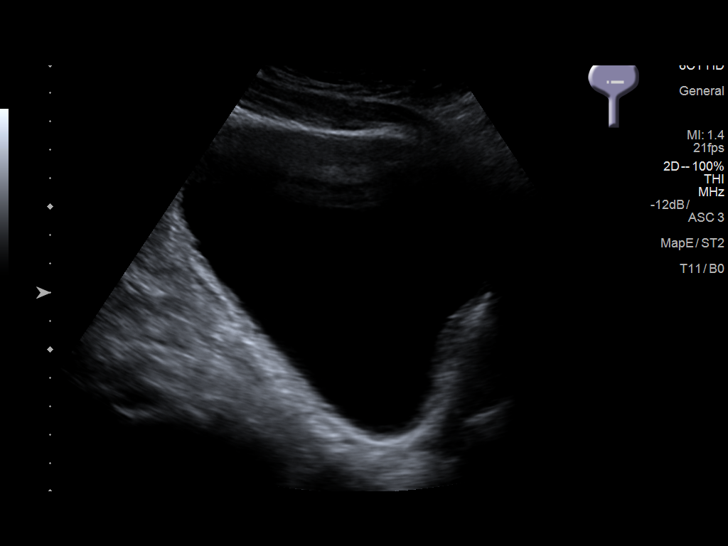

[14 of 25 positions shown; findings below may reference images not displayed]

FINDINGS: Right Kidney:

Length: 11.1 cm. Echogenicity within normal limits. No mass or
hydronephrosis visualized.

Left Kidney:

Length: 11.1 cm. Echogenicity within normal limits. No mass or
hydronephrosis visualized. 1.2 by 1.2 by 1.0 cm left mid kidney
simple appearing cyst. 0.8 by 0.8 by 0.6 cm left kidney lower pole
hypoechoic lesion compatible with cyst.

Bladder:

Appears normal for degree of bladder distention.
IMPRESSION: 1. Several small left renal cyst but otherwise normal sonographic
appearance of the kidneys in urinary bladder.

## 2018-01-28 ENCOUNTER — Telehealth: Payer: Self-pay | Admitting: Family Medicine

## 2018-01-28 NOTE — Telephone Encounter (Signed)
Patient called the office asking to speak with [name] (CM) regarding the swelling on his right leg. Hallie informed patient that [name] was not in the office but that I can help him. Spoke with patient and he informed me that swelling had gotten worst and that nurse at [facility] was not there. Patient asked if he could be seen sooner than next Wednesday. Schedule patient an appointment with provider at [facility]. Patient asked for help with transportation for tomorrow's visit since nurse was not in the facility. Informed patient that I would be returning the call.   Called placed to [place] [employer]) and she confirmed that she was not at [facility]. She stated that she could have one of the social worker interns give patient a reduced bus pass but that he would still have to pay out of pocket and she wasn't sure if he had any money. Informed [name] that we would provide transportation for patient.   Called patient back and informed him that [facility] would provide transportation and to be ready and outside facility by 8:30am. Patient understood and had no further question.

## 2018-01-29 ENCOUNTER — Encounter: Payer: Self-pay | Admitting: Family Medicine

## 2018-01-29 ENCOUNTER — Telehealth: Payer: Self-pay | Admitting: Family Medicine

## 2018-01-29 ENCOUNTER — Telehealth: Payer: Self-pay

## 2018-01-29 ENCOUNTER — Ambulatory Visit: Payer: Medicaid Other | Attending: Family Medicine | Admitting: Family Medicine

## 2018-01-29 ENCOUNTER — Ambulatory Visit (HOSPITAL_COMMUNITY)
Admission: RE | Admit: 2018-01-29 | Discharge: 2018-01-29 | Disposition: A | Discharge status: Home / Self Care | Payer: Medicaid Other | Source: Ambulatory Visit | Attending: Family Medicine | Admitting: Family Medicine

## 2018-01-29 VITALS — BP 156/96 | HR 84 | Temp 98.2°F | Ht 71.0 in | Wt 203.0 lb

## 2018-01-29 DIAGNOSIS — Z9889 Other specified postprocedural states: Secondary | ICD-10-CM | POA: Diagnosis not present

## 2018-01-29 DIAGNOSIS — N528 Other male erectile dysfunction: Secondary | ICD-10-CM

## 2018-01-29 DIAGNOSIS — I4891 Unspecified atrial fibrillation: Secondary | ICD-10-CM | POA: Insufficient documentation

## 2018-01-29 DIAGNOSIS — E7800 Pure hypercholesterolemia, unspecified: Secondary | ICD-10-CM | POA: Diagnosis not present

## 2018-01-29 DIAGNOSIS — E1142 Type 2 diabetes mellitus with diabetic polyneuropathy: Secondary | ICD-10-CM | POA: Insufficient documentation

## 2018-01-29 DIAGNOSIS — Z8546 Personal history of malignant neoplasm of prostate: Secondary | ICD-10-CM | POA: Insufficient documentation

## 2018-01-29 DIAGNOSIS — M1990 Unspecified osteoarthritis, unspecified site: Secondary | ICD-10-CM | POA: Insufficient documentation

## 2018-01-29 DIAGNOSIS — Z955 Presence of coronary angioplasty implant and graft: Secondary | ICD-10-CM | POA: Insufficient documentation

## 2018-01-29 DIAGNOSIS — F1911 Other psychoactive substance abuse, in remission: Secondary | ICD-10-CM | POA: Insufficient documentation

## 2018-01-29 DIAGNOSIS — M7989 Other specified soft tissue disorders: Secondary | ICD-10-CM | POA: Diagnosis not present

## 2018-01-29 DIAGNOSIS — E1121 Type 2 diabetes mellitus with diabetic nephropathy: Secondary | ICD-10-CM | POA: Diagnosis not present

## 2018-01-29 DIAGNOSIS — Z87828 Personal history of other (healed) physical injury and trauma: Secondary | ICD-10-CM | POA: Diagnosis not present

## 2018-01-29 DIAGNOSIS — Z794 Long term (current) use of insulin: Secondary | ICD-10-CM | POA: Diagnosis not present

## 2018-01-29 DIAGNOSIS — I130 Hypertensive heart and chronic kidney disease with heart failure and stage 1 through stage 4 chronic kidney disease, or unspecified chronic kidney disease: Secondary | ICD-10-CM | POA: Insufficient documentation

## 2018-01-29 DIAGNOSIS — Z79899 Other long term (current) drug therapy: Secondary | ICD-10-CM | POA: Insufficient documentation

## 2018-01-29 DIAGNOSIS — Z8619 Personal history of other infectious and parasitic diseases: Secondary | ICD-10-CM | POA: Diagnosis not present

## 2018-01-29 DIAGNOSIS — E1165 Type 2 diabetes mellitus with hyperglycemia: Secondary | ICD-10-CM | POA: Insufficient documentation

## 2018-01-29 DIAGNOSIS — E1122 Type 2 diabetes mellitus with diabetic chronic kidney disease: Secondary | ICD-10-CM | POA: Diagnosis not present

## 2018-01-29 DIAGNOSIS — I10 Essential (primary) hypertension: Secondary | ICD-10-CM | POA: Diagnosis not present

## 2018-01-29 DIAGNOSIS — R9439 Abnormal result of other cardiovascular function study: Secondary | ICD-10-CM | POA: Diagnosis not present

## 2018-01-29 DIAGNOSIS — N529 Male erectile dysfunction, unspecified: Secondary | ICD-10-CM | POA: Diagnosis not present

## 2018-01-29 DIAGNOSIS — I5032 Chronic diastolic (congestive) heart failure: Secondary | ICD-10-CM | POA: Diagnosis not present

## 2018-01-29 DIAGNOSIS — K219 Gastro-esophageal reflux disease without esophagitis: Secondary | ICD-10-CM | POA: Insufficient documentation

## 2018-01-29 DIAGNOSIS — J018 Other acute sinusitis: Secondary | ICD-10-CM

## 2018-01-29 DIAGNOSIS — Z888 Allergy status to other drugs, medicaments and biological substances status: Secondary | ICD-10-CM | POA: Insufficient documentation

## 2018-01-29 DIAGNOSIS — N183 Chronic kidney disease, stage 3 (moderate): Secondary | ICD-10-CM | POA: Insufficient documentation

## 2018-01-29 DIAGNOSIS — IMO0002 Reserved for concepts with insufficient information to code with codable children: Secondary | ICD-10-CM

## 2018-01-29 LAB — POCT GLYCOSYLATED HEMOGLOBIN (HGB A1C): Hemoglobin A1C: 10.6

## 2018-01-29 LAB — GLUCOSE, POCT (MANUAL RESULT ENTRY): POC Glucose: 383 mg/dl — AB (ref 70–99)

## 2018-01-29 MED ORDER — ACETAMINOPHEN-CODEINE #3 300-30 MG PO TABS: 1.0000 | ORAL_TABLET | Freq: Two times a day (BID) | ORAL | 0 refills | Status: DC | PRN

## 2018-01-29 MED ORDER — CEPHALEXIN 500 MG PO CAPS: 500.0000 mg | ORAL_CAPSULE | Freq: Two times a day (BID) | ORAL | 0 refills | Status: DC

## 2018-01-29 MED ORDER — GUAIFENESIN ER 600 MG PO TB12: 600.0000 mg | ORAL_TABLET | Freq: Two times a day (BID) | ORAL | 0 refills | Status: DC

## 2018-01-29 MED ORDER — FUROSEMIDE 20 MG PO TABS
20.0000 mg | ORAL_TABLET | Freq: Every day | ORAL | 1 refills | Status: DC
Start: 2018-01-29 — End: 2018-07-25

## 2018-01-29 MED FILL — ACETAMINOPHEN/COD #3 TABLET: 300-30 | 3 days supply | Qty: 15 | Fill #0

## 2018-01-29 NOTE — Patient Instructions (Signed)
Edema Edema is when you have too much fluid in your body or under your skin. Edema may make your legs, feet, and ankles swell up. Swelling is also common in looser tissues, like around your eyes. This is a common condition. It gets more common as you get older. There are many possible causes of edema. Eating too much salt (sodium) and being on your feet or sitting for a long time can cause edema in your legs, feet, and ankles. Hot weather may make edema worse. Edema is usually painless. Your skin may look swollen or shiny. Follow these instructions at home:  Keep the swollen body part raised (elevated) above the level of your heart when you are sitting or lying down.  Do not sit still or stand for a long time.  Do not wear tight clothes. Do not wear garters on your upper legs.  Exercise your legs. This can help the swelling go down.  Wear elastic bandages or support stockings as told by your doctor.  Eat a low-salt (low-sodium) diet to reduce fluid as told by your doctor.  Depending on the cause of your swelling, you may need to limit how much fluid you drink (fluid restriction).  Take over-the-counter and prescription medicines only as told by your doctor. Contact a doctor if:  Treatment is not working.  You have heart, liver, or kidney disease and have symptoms of edema.  You have sudden and unexplained weight gain. Get help right away if:  You have shortness of breath or chest pain.  You cannot breathe when you lie down.  You have pain, redness, or warmth in the swollen areas.  You have heart, liver, or kidney disease and get edema all of a sudden.  You have a fever and your symptoms get worse all of a sudden. Summary  Edema is when you have too much fluid in your body or under your skin.  Edema may make your legs, feet, and ankles swell up. Swelling is also common in looser tissues, like around your eyes.  Raise (elevate) the swollen body part above the level of your  heart when you are sitting or lying down.  Follow your doctor's instructions about diet and how much fluid you can drink (fluid restriction). This information is not intended to replace advice given to you by your health care provider. Make sure you discuss any questions you have with your health care provider. Document Released: 05/26/2008 Document Revised: 12/26/2016 Document Reviewed: 12/26/2016 Elsevier Interactive Patient Education  2017 Elsevier Inc.  

## 2018-01-29 NOTE — Progress Notes (Signed)
*  Preliminary Results* Right lower extremity venous duplex completed. Right lower extremity is negative for deep vein thrombosis. There is no evidence of right Baker's cyst.  Incidental finding: there is a heterogenous area of the right groin measuring 4.2cm, suggestive of a possible enlarged inguinal lymph node.  Preliminary results discussed with Rolm Gala.   01/29/2018 3:24 PM  Maudry Mayhew, BS, RVT, RDCS, RDMS

## 2018-01-29 NOTE — Telephone Encounter (Signed)
Met with the patient when he was in the clinic today for his appointment.  He stated that he has been staying at [facility].  He explained that he stopped drinking alcohol and has been sober x 30 days and is very pleased with himself. This CM commended him for this accomplishment.   He was leaving [facility] and was going back to [facility].  Dr Margarita Rana has placed orders for his medications to be sent to [facility].  This CM spoke to [place] and she confirmed that [facility] will deliver the medications for the patient and he does not have to be present to accept the delivery.  Cab transportation was provided for the patient back to [facility]. He was given  Bus passes to come back to [facility] at 1500 today for an ultrasound of her right lower extremity.
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
## 2018-01-29 NOTE — Telephone Encounter (Signed)
Noted  

## 2018-01-29 NOTE — Telephone Encounter (Signed)
[name], Brad A. called to give Preliminary results:Negative DVT, enlarged right lymph node. Please follow up

## 2018-02-01 LAB — CMP14+EGFR
ALT: 32 IU/L (ref 0–44)
AST: 34 IU/L (ref 0–40)
Albumin/Globulin Ratio: 0.9 — ABNORMAL LOW (ref 1.2–2.2)
Albumin: 3.9 g/dL (ref 3.5–5.5)
Alkaline Phosphatase: 101 IU/L (ref 39–117)
BUN/Creatinine Ratio: 11 (ref 9–20)
BUN: 17 mg/dL (ref 6–24)
Bilirubin Total: 0.6 mg/dL (ref 0.0–1.2)
CO2: 22 mmol/L (ref 20–29)
Calcium: 9 mg/dL (ref 8.7–10.2)
Chloride: 92 mmol/L — ABNORMAL LOW (ref 96–106)
Creatinine, Ser: 1.51 mg/dL — ABNORMAL HIGH (ref 0.76–1.27)
GFR calc Af Amer: 58 mL/min/{1.73_m2} — ABNORMAL LOW (ref >59)
GFR calc non Af Amer: 50 mL/min/{1.73_m2} — ABNORMAL LOW (ref >59)
Globulin, Total: 4.4 g/dL (ref 1.5–4.5)
Glucose: 348 mg/dL — ABNORMAL HIGH (ref 65–99)
Potassium: 3.5 mmol/L (ref 3.5–5.2)
Sodium: 135 mmol/L (ref 134–144)
Total Protein: 8.3 g/dL (ref 6.0–8.5)

## 2018-02-01 LAB — CBC WITH DIFFERENTIAL/PLATELET
Basophils Absolute: 0 10*3/uL (ref 0.0–0.2)
Basos: 0 %
EOS (ABSOLUTE): 0.1 10*3/uL (ref 0.0–0.4)
Eos: 2 %
Hematocrit: 40 % (ref 37.5–51.0)
Hemoglobin: 14.1 g/dL (ref 13.0–17.7)
Immature Grans (Abs): 0 10*3/uL (ref 0.0–0.1)
Immature Granulocytes: 0 %
Lymphocytes Absolute: 1.7 10*3/uL (ref 0.7–3.1)
Lymphs: 29 %
MCH: 33 pg (ref 26.6–33.0)
MCHC: 35.3 g/dL (ref 31.5–35.7)
MCV: 94 fL (ref 79–97)
Monocytes Absolute: 0.8 10*3/uL (ref 0.1–0.9)
Monocytes: 14 %
Neutrophils Absolute: 3.3 10*3/uL (ref 1.4–7.0)
Neutrophils: 55 %
Platelets: 189 10*3/uL (ref 150–379)
RBC: 4.27 x10E6/uL (ref 4.14–5.80)
RDW: 13 % (ref 12.3–15.4)
WBC: 5.9 10*3/uL (ref 3.4–10.8)

## 2018-02-01 LAB — TESTOSTERONE,FREE AND TOTAL
Testosterone, Free: 11.3 pg/mL (ref 7.2–24.0)
Testosterone: 421 ng/dL (ref 264–916)

## 2018-02-03 ENCOUNTER — Ambulatory Visit: Payer: Self-pay | Admitting: Family Medicine

## 2018-02-04 ENCOUNTER — Telehealth: Payer: Self-pay

## 2018-02-04 NOTE — Telephone Encounter (Signed)
Patient was called and informed to contact office to go over lab results.   If patient returns phone call please inform patient Lab results are stable, there is no evidence of infection and testosterone level is normal.

## 2018-02-11 ENCOUNTER — Telehealth: Payer: Self-pay

## 2018-02-11 IMAGING — US US ABDOMEN COMPLETE
1 series · 14 of 25 positions shown · non-contrast
Comparison: CT abdomen pelvis 06/26/2016

CLINICAL DATA: Patient with right upper quadrant pain for 2 days.

EXAM:
ABDOMEN ULTRASOUND COMPLETE

[Series 1: us abdomen complete · 0.20mm/px · 14 of 87 slices shown]
[im 1/87]
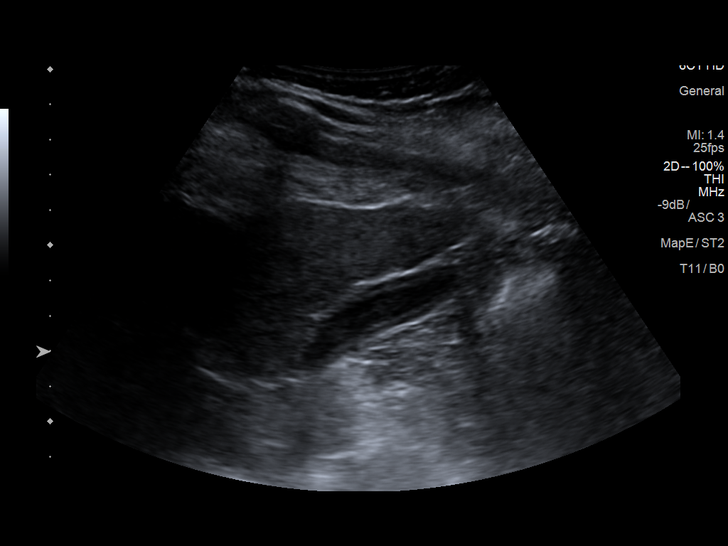
[im 8/87]
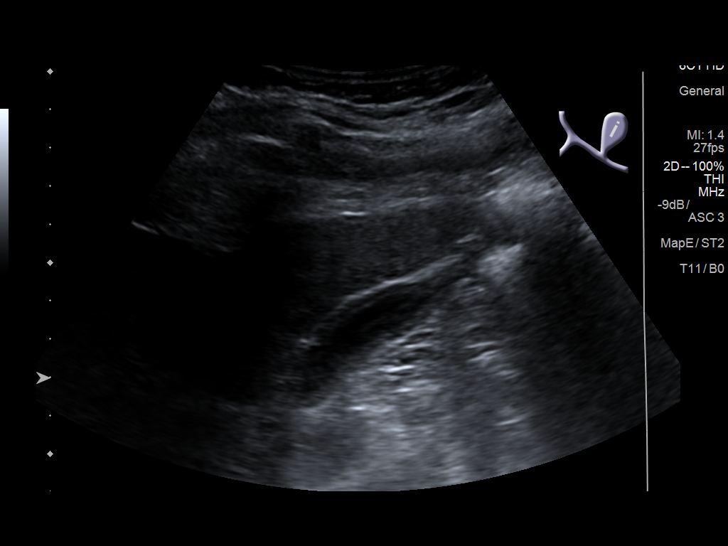
[im 15/87]
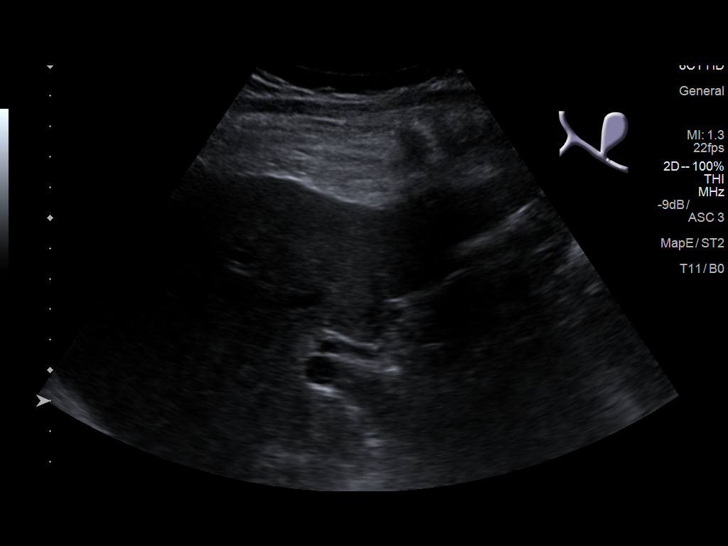
[im 22/87]
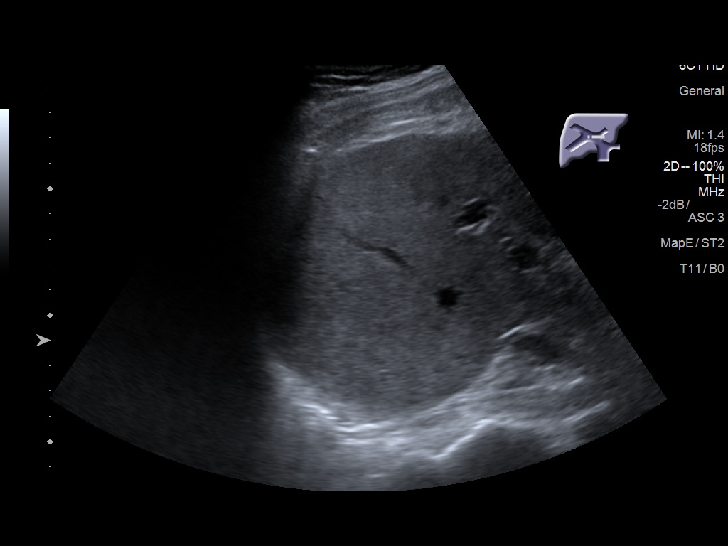
[im 29/87]
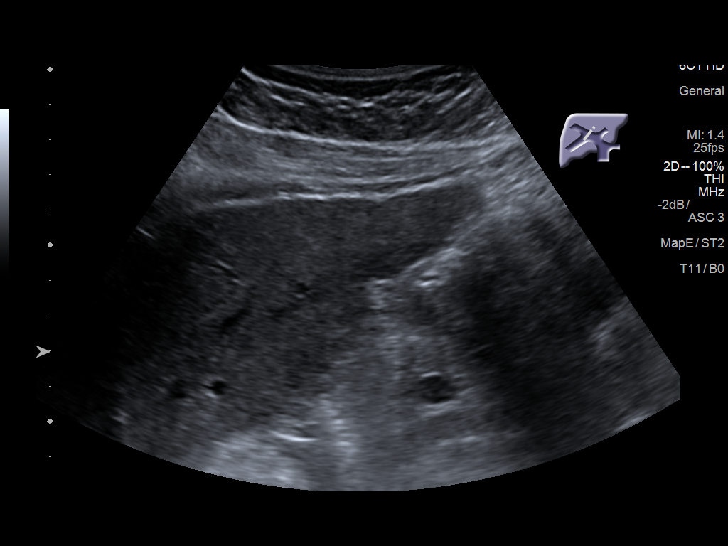
[im 33/87]
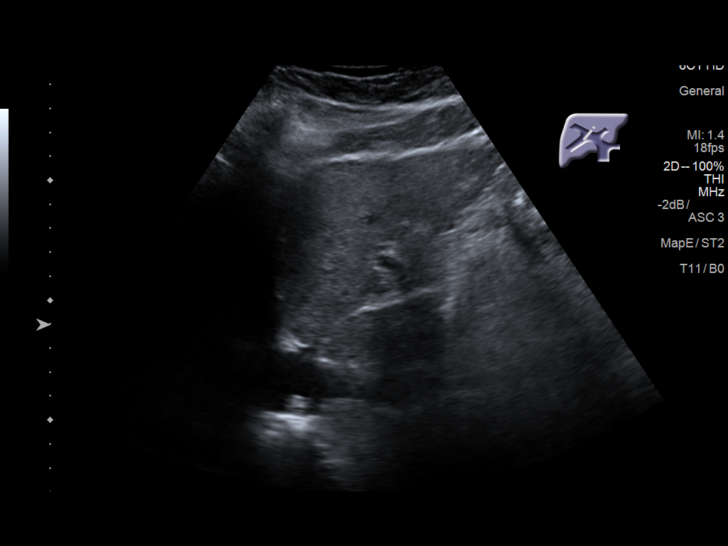
[im 40/87]
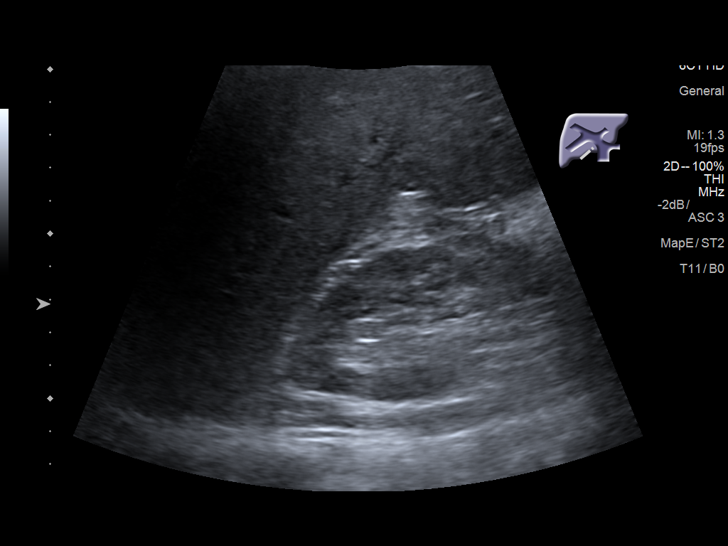
[im 47/87]
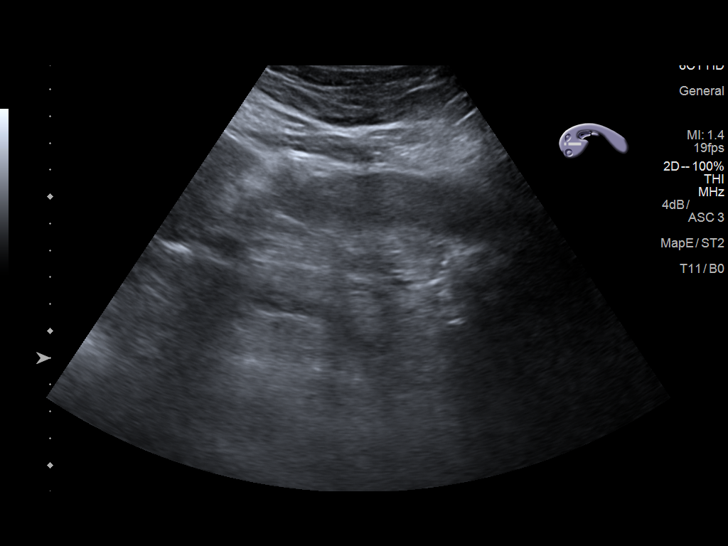
[im 54/87]
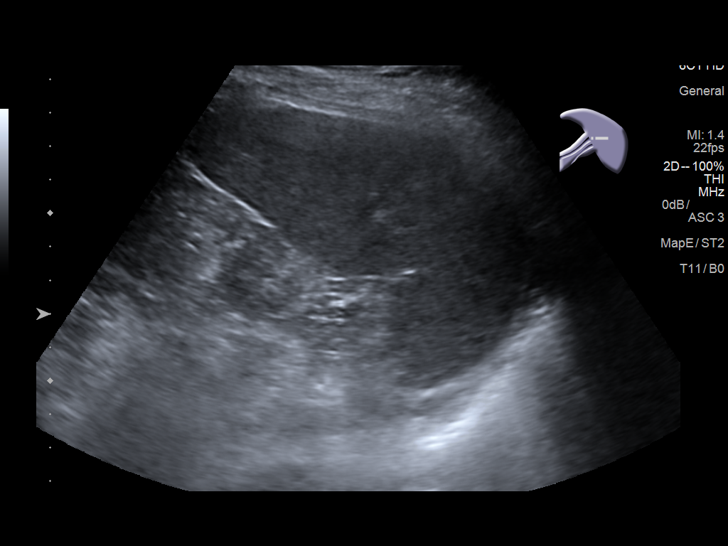
[im 58/87]
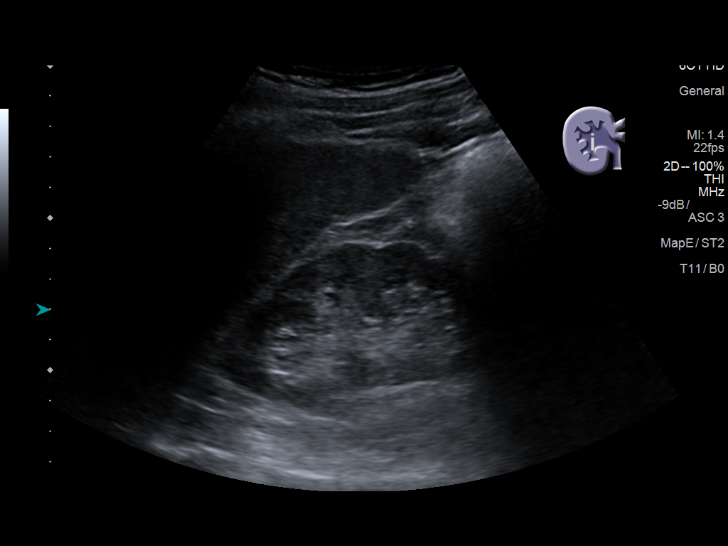
[im 65/87]
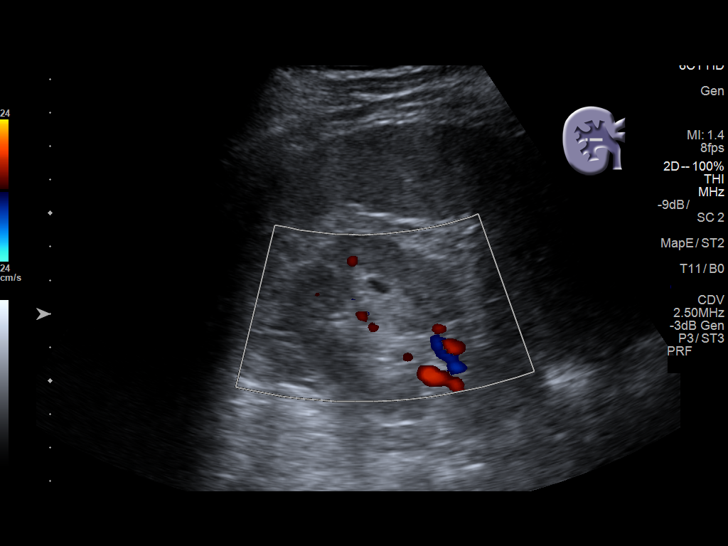
[im 72/87]
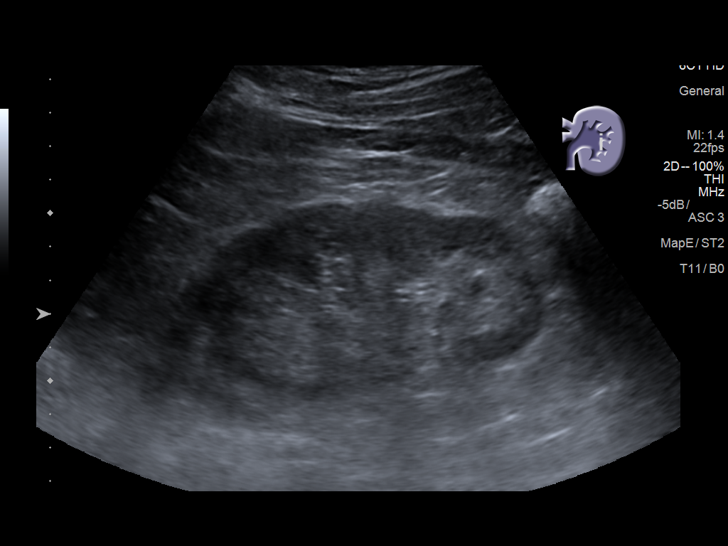
[im 79/87]
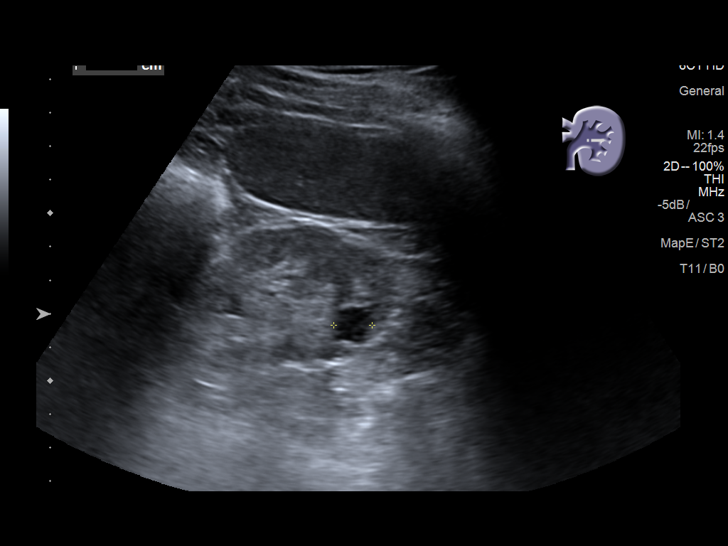
[im 87/87]
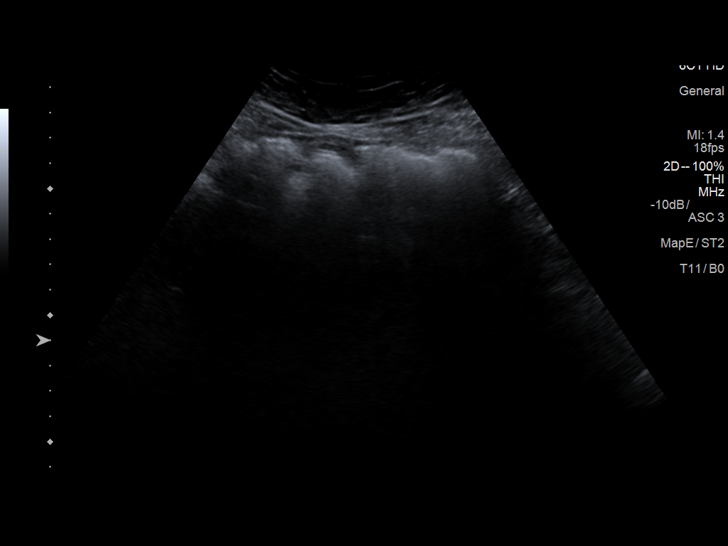

[14 of 25 positions shown; findings below may reference images not displayed]

FINDINGS: Gallbladder: Gallbladder is decompressed. Wall thickness upper
limits of normal measuring 4 mm. No pericholecystic fluid. Negative
sonographic Murphy's sign. Gallbladder is contracted.

Common bile duct: Diameter: 4 mm

Liver: Liver is increased in echogenicity. No focal lesions
identified. The liver is nodular in contour suggestive of cirrhosis.

IVC: No abnormality visualized.

Pancreas: Visualized portion unremarkable.

Spleen: Size and appearance within normal limits.

Right Kidney: Length: 11.8 cm. Echogenicity within normal limits. No
mass or hydronephrosis visualized.

Left Kidney: Length: 10.8 cm. Normal renal cortical thickness and
echogenicity. No hydronephrosis. There is a 1.3 x 1.1 x 1.2 cm cyst.

Abdominal aorta: No aneurysm visualized.

Other findings: None.
IMPRESSION: The gallbladder is contracted with mild wall thickening. No
cholelithiasis or secondary signs to suggest acute cholecystitis.

No hydronephrosis.

Hepatic steatosis and nodular contour suggestive of cirrhosis.

## 2018-02-11 NOTE — Telephone Encounter (Signed)
Patient was called and there is no voicemail set up to leave a message. 

## 2018-02-16 ENCOUNTER — Encounter (HOSPITAL_COMMUNITY): Payer: Self-pay

## 2018-02-16 ENCOUNTER — Other Ambulatory Visit: Payer: Self-pay

## 2018-02-16 ENCOUNTER — Emergency Department (HOSPITAL_COMMUNITY)
Admission: EM | Admit: 2018-02-16 | Discharge: 2018-02-16 | Disposition: A | Discharge status: Home / Self Care | Payer: Medicaid Other | Attending: Emergency Medicine | Admitting: Emergency Medicine

## 2018-02-16 DIAGNOSIS — I130 Hypertensive heart and chronic kidney disease with heart failure and stage 1 through stage 4 chronic kidney disease, or unspecified chronic kidney disease: Secondary | ICD-10-CM | POA: Insufficient documentation

## 2018-02-16 DIAGNOSIS — I252 Old myocardial infarction: Secondary | ICD-10-CM | POA: Insufficient documentation

## 2018-02-16 DIAGNOSIS — F17210 Nicotine dependence, cigarettes, uncomplicated: Secondary | ICD-10-CM | POA: Diagnosis not present

## 2018-02-16 DIAGNOSIS — I5032 Chronic diastolic (congestive) heart failure: Secondary | ICD-10-CM | POA: Insufficient documentation

## 2018-02-16 DIAGNOSIS — R1013 Epigastric pain: Secondary | ICD-10-CM | POA: Diagnosis not present

## 2018-02-16 DIAGNOSIS — I4891 Unspecified atrial fibrillation: Secondary | ICD-10-CM | POA: Insufficient documentation

## 2018-02-16 DIAGNOSIS — R112 Nausea with vomiting, unspecified: Secondary | ICD-10-CM | POA: Diagnosis present

## 2018-02-16 DIAGNOSIS — R197 Diarrhea, unspecified: Secondary | ICD-10-CM | POA: Insufficient documentation

## 2018-02-16 DIAGNOSIS — Z8546 Personal history of malignant neoplasm of prostate: Secondary | ICD-10-CM | POA: Diagnosis not present

## 2018-02-16 DIAGNOSIS — E1122 Type 2 diabetes mellitus with diabetic chronic kidney disease: Secondary | ICD-10-CM | POA: Diagnosis not present

## 2018-02-16 DIAGNOSIS — N183 Chronic kidney disease, stage 3 (moderate): Secondary | ICD-10-CM | POA: Diagnosis not present

## 2018-02-16 DIAGNOSIS — Z794 Long term (current) use of insulin: Secondary | ICD-10-CM | POA: Insufficient documentation

## 2018-02-16 DIAGNOSIS — Z79899 Other long term (current) drug therapy: Secondary | ICD-10-CM | POA: Insufficient documentation

## 2018-02-16 LAB — COMPREHENSIVE METABOLIC PANEL
ALT: 58 U/L (ref 17–63)
AST: 48 U/L — ABNORMAL HIGH (ref 15–41)
Albumin: 3.5 g/dL (ref 3.5–5.0)
Alkaline Phosphatase: 77 U/L (ref 38–126)
Anion gap: 12 (ref 5–15)
BUN: 23 mg/dL — ABNORMAL HIGH (ref 6–20)
CO2: 29 mmol/L (ref 22–32)
Calcium: 8.9 mg/dL (ref 8.9–10.3)
Chloride: 94 mmol/L — ABNORMAL LOW (ref 101–111)
Creatinine, Ser: 1.62 mg/dL — ABNORMAL HIGH (ref 0.61–1.24)
GFR calc Af Amer: 52 mL/min — ABNORMAL LOW (ref >60)
GFR calc non Af Amer: 45 mL/min — ABNORMAL LOW (ref >60)
Glucose, Bld: 317 mg/dL — ABNORMAL HIGH (ref 65–99)
Potassium: 3.6 mmol/L (ref 3.5–5.1)
Sodium: 135 mmol/L (ref 135–145)
Total Bilirubin: 0.9 mg/dL (ref 0.3–1.2)
Total Protein: 8.8 g/dL — ABNORMAL HIGH (ref 6.5–8.1)

## 2018-02-16 LAB — CBC WITH DIFFERENTIAL/PLATELET
Basophils Absolute: 0 10*3/uL (ref 0.0–0.1)
Basophils Relative: 0 %
Eosinophils Absolute: 0 10*3/uL (ref 0.0–0.7)
Eosinophils Relative: 1 %
HCT: 44.5 % (ref 39.0–52.0)
Hemoglobin: 15.6 g/dL (ref 13.0–17.0)
Lymphocytes Relative: 27 %
Lymphs Abs: 1.5 10*3/uL (ref 0.7–4.0)
MCH: 32.9 pg (ref 26.0–34.0)
MCHC: 35.1 g/dL (ref 30.0–36.0)
MCV: 93.9 fL (ref 78.0–100.0)
Monocytes Absolute: 0.7 10*3/uL (ref 0.1–1.0)
Monocytes Relative: 12 %
Neutro Abs: 3.4 10*3/uL (ref 1.7–7.7)
Neutrophils Relative %: 60 %
Platelets: 203 10*3/uL (ref 150–400)
RBC: 4.74 MIL/uL (ref 4.22–5.81)
RDW: 11.9 % (ref 11.5–15.5)
WBC: 5.7 10*3/uL (ref 4.0–10.5)

## 2018-02-16 LAB — LIPASE, BLOOD: Lipase: 26 U/L (ref 11–51)

## 2018-02-16 MED ORDER — GI COCKTAIL ~~LOC~~
30.0000 mL | Freq: Once | ORAL | Status: AC
  Administered 2018-02-16: 30 mL via ORAL
  Filled 2018-02-16: qty 30

## 2018-02-16 MED ORDER — SODIUM CHLORIDE 0.9 % IV BOLUS (SEPSIS)
500.0000 mL | Freq: Once | INTRAVENOUS | Status: AC
  Administered 2018-02-16: 500 mL via INTRAVENOUS

## 2018-02-16 MED ORDER — ONDANSETRON HCL 4 MG/2ML IJ SOLN
4.0000 mg | Freq: Once | INTRAMUSCULAR | Status: AC
  Administered 2018-02-16: 4 mg via INTRAVENOUS
  Filled 2018-02-16: qty 2

## 2018-02-16 MED ORDER — ONDANSETRON 4 MG PO TBDP: 4.0000 mg | ORAL_TABLET | Freq: Three times a day (TID) | ORAL | 0 refills | Status: DC | PRN

## 2018-02-16 MED ORDER — PANTOPRAZOLE SODIUM 40 MG IV SOLR
40.0000 mg | Freq: Once | INTRAVENOUS | Status: AC
  Administered 2018-02-16: 40 mg via INTRAVENOUS
  Filled 2018-02-16: qty 40

## 2018-02-16 NOTE — Discharge Instructions (Addendum)
Wash your hands before meals and after using the bathroom.  Do not take Eliquis today or tomorrow.  You can take omeprazole twice daily for the next week.  Get rechecked immediately if you develop new concerning symptoms, worsening pain or bleeding.

## 2018-02-16 NOTE — ED Triage Notes (Signed)
[facility], nausea vomiting and diarrhea since last night after eating stewed beef, c/o dark red blood in vomit, chronic right foot swelling. Hx of diabetes, Hypertension, CHF, A-Fib  BP 186/118 HR 92 Resp 18 Sp02 94 RA CBG 333
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
## 2018-02-18 ENCOUNTER — Telehealth: Payer: Self-pay | Admitting: Family Medicine

## 2018-02-18 ENCOUNTER — Telehealth: Payer: Self-pay

## 2018-02-18 NOTE — Telephone Encounter (Signed)
At the patient's request, a text message was sent to his cell # informing him that a bus pass will be left at the front dest for him to come to his appointment at the health center tomorrow and the health center will provide him a bus pass to get back after his appointment.

## 2018-02-18 NOTE — Telephone Encounter (Signed)
Call placed to the [facility] [phone], in regards to coordinating transportation for patient. Spoke with Natale Milch (Tourist information centre manager) and she informed me that they will be giving patient a bus pass to get to his appointment with [place] tomorrow 3/1. They will leave the pass in the front desk area. [name] [facility] case manager) will inform patient of this.

## 2018-02-19 ENCOUNTER — Ambulatory Visit: Payer: Medicaid Other | Attending: Family Medicine | Admitting: Family Medicine

## 2018-02-19 ENCOUNTER — Emergency Department (HOSPITAL_COMMUNITY)
Admission: EM | Admit: 2018-02-19 | Discharge: 2018-02-19 | Disposition: A | Discharge status: Home / Self Care | Payer: Medicaid Other | Attending: Emergency Medicine | Admitting: Emergency Medicine

## 2018-02-19 ENCOUNTER — Other Ambulatory Visit: Payer: Self-pay

## 2018-02-19 ENCOUNTER — Encounter: Payer: Self-pay | Admitting: Family Medicine

## 2018-02-19 ENCOUNTER — Emergency Department (HOSPITAL_COMMUNITY): Payer: Medicaid Other

## 2018-02-19 ENCOUNTER — Telehealth: Payer: Self-pay | Admitting: Family Medicine

## 2018-02-19 ENCOUNTER — Encounter (HOSPITAL_COMMUNITY): Payer: Self-pay

## 2018-02-19 VITALS — BP 140/100 | HR 105 | Temp 98.2°F | Ht 71.0 in | Wt 188.4 lb

## 2018-02-19 DIAGNOSIS — Z888 Allergy status to other drugs, medicaments and biological substances status: Secondary | ICD-10-CM | POA: Insufficient documentation

## 2018-02-19 DIAGNOSIS — M7989 Other specified soft tissue disorders: Secondary | ICD-10-CM | POA: Diagnosis not present

## 2018-02-19 DIAGNOSIS — F1410 Cocaine abuse, uncomplicated: Secondary | ICD-10-CM | POA: Insufficient documentation

## 2018-02-19 DIAGNOSIS — N183 Chronic kidney disease, stage 3 (moderate): Secondary | ICD-10-CM | POA: Diagnosis not present

## 2018-02-19 DIAGNOSIS — E1121 Type 2 diabetes mellitus with diabetic nephropathy: Secondary | ICD-10-CM | POA: Diagnosis not present

## 2018-02-19 DIAGNOSIS — R109 Unspecified abdominal pain: Secondary | ICD-10-CM | POA: Diagnosis not present

## 2018-02-19 DIAGNOSIS — K219 Gastro-esophageal reflux disease without esophagitis: Secondary | ICD-10-CM | POA: Diagnosis not present

## 2018-02-19 DIAGNOSIS — F17200 Nicotine dependence, unspecified, uncomplicated: Secondary | ICD-10-CM | POA: Diagnosis not present

## 2018-02-19 DIAGNOSIS — R55 Syncope and collapse: Secondary | ICD-10-CM | POA: Diagnosis present

## 2018-02-19 DIAGNOSIS — I5032 Chronic diastolic (congestive) heart failure: Secondary | ICD-10-CM | POA: Insufficient documentation

## 2018-02-19 DIAGNOSIS — I5043 Acute on chronic combined systolic (congestive) and diastolic (congestive) heart failure: Secondary | ICD-10-CM | POA: Insufficient documentation

## 2018-02-19 DIAGNOSIS — E1122 Type 2 diabetes mellitus with diabetic chronic kidney disease: Secondary | ICD-10-CM | POA: Diagnosis present

## 2018-02-19 DIAGNOSIS — I130 Hypertensive heart and chronic kidney disease with heart failure and stage 1 through stage 4 chronic kidney disease, or unspecified chronic kidney disease: Secondary | ICD-10-CM | POA: Insufficient documentation

## 2018-02-19 DIAGNOSIS — Z7901 Long term (current) use of anticoagulants: Secondary | ICD-10-CM | POA: Diagnosis not present

## 2018-02-19 DIAGNOSIS — E1142 Type 2 diabetes mellitus with diabetic polyneuropathy: Secondary | ICD-10-CM | POA: Diagnosis not present

## 2018-02-19 DIAGNOSIS — G8929 Other chronic pain: Secondary | ICD-10-CM | POA: Insufficient documentation

## 2018-02-19 DIAGNOSIS — F329 Major depressive disorder, single episode, unspecified: Secondary | ICD-10-CM | POA: Insufficient documentation

## 2018-02-19 DIAGNOSIS — Z79899 Other long term (current) drug therapy: Secondary | ICD-10-CM | POA: Insufficient documentation

## 2018-02-19 DIAGNOSIS — I10 Essential (primary) hypertension: Secondary | ICD-10-CM | POA: Diagnosis not present

## 2018-02-19 DIAGNOSIS — Z794 Long term (current) use of insulin: Secondary | ICD-10-CM | POA: Insufficient documentation

## 2018-02-19 DIAGNOSIS — F1911 Other psychoactive substance abuse, in remission: Secondary | ICD-10-CM | POA: Insufficient documentation

## 2018-02-19 DIAGNOSIS — B1920 Unspecified viral hepatitis C without hepatic coma: Secondary | ICD-10-CM | POA: Insufficient documentation

## 2018-02-19 DIAGNOSIS — E1165 Type 2 diabetes mellitus with hyperglycemia: Secondary | ICD-10-CM | POA: Insufficient documentation

## 2018-02-19 DIAGNOSIS — E1140 Type 2 diabetes mellitus with diabetic neuropathy, unspecified: Secondary | ICD-10-CM | POA: Diagnosis not present

## 2018-02-19 DIAGNOSIS — Z9889 Other specified postprocedural states: Secondary | ICD-10-CM | POA: Diagnosis not present

## 2018-02-19 DIAGNOSIS — R599 Enlarged lymph nodes, unspecified: Secondary | ICD-10-CM

## 2018-02-19 DIAGNOSIS — I959 Hypotension, unspecified: Secondary | ICD-10-CM | POA: Diagnosis not present

## 2018-02-19 DIAGNOSIS — I2510 Atherosclerotic heart disease of native coronary artery without angina pectoris: Secondary | ICD-10-CM | POA: Diagnosis not present

## 2018-02-19 DIAGNOSIS — E7800 Pure hypercholesterolemia, unspecified: Secondary | ICD-10-CM | POA: Insufficient documentation

## 2018-02-19 DIAGNOSIS — R112 Nausea with vomiting, unspecified: Secondary | ICD-10-CM | POA: Diagnosis not present

## 2018-02-19 DIAGNOSIS — M25571 Pain in right ankle and joints of right foot: Secondary | ICD-10-CM | POA: Insufficient documentation

## 2018-02-19 DIAGNOSIS — R42 Dizziness and giddiness: Secondary | ICD-10-CM

## 2018-02-19 DIAGNOSIS — R590 Localized enlarged lymph nodes: Secondary | ICD-10-CM | POA: Insufficient documentation

## 2018-02-19 DIAGNOSIS — I4891 Unspecified atrial fibrillation: Secondary | ICD-10-CM | POA: Insufficient documentation

## 2018-02-19 LAB — CBC
HCT: 43.6 % (ref 39.0–52.0)
Hemoglobin: 15.4 g/dL (ref 13.0–17.0)
MCH: 32.6 pg (ref 26.0–34.0)
MCHC: 35.3 g/dL (ref 30.0–36.0)
MCV: 92.4 fL (ref 78.0–100.0)
Platelets: 196 10*3/uL (ref 150–400)
RBC: 4.72 MIL/uL (ref 4.22–5.81)
RDW: 11.8 % (ref 11.5–15.5)
WBC: 5.2 10*3/uL (ref 4.0–10.5)

## 2018-02-19 LAB — I-STAT VENOUS BLOOD GAS, ED
Acid-Base Excess: 4 mmol/L — ABNORMAL HIGH (ref 0.0–2.0)
Bicarbonate: 26.4 mmol/L (ref 20.0–28.0)
O2 Saturation: 99 %
TCO2: 27 mmol/L (ref 22–32)
pCO2, Ven: 33 mmHg — ABNORMAL LOW (ref 44.0–60.0)
pH, Ven: 7.511 — ABNORMAL HIGH (ref 7.250–7.430)
pO2, Ven: 111 mmHg — ABNORMAL HIGH (ref 32.0–45.0)

## 2018-02-19 LAB — CBG MONITORING, ED
Glucose-Capillary: 282 mg/dL — ABNORMAL HIGH (ref 65–99)
Glucose-Capillary: 294 mg/dL — ABNORMAL HIGH (ref 65–99)

## 2018-02-19 LAB — GLUCOSE, POCT (MANUAL RESULT ENTRY)
POC Glucose: 502 mg/dl — AB (ref 70–99)
POC Glucose: 439 mg/dl — AB (ref 70–99)

## 2018-02-19 LAB — COMPREHENSIVE METABOLIC PANEL
ALT: 66 U/L — ABNORMAL HIGH (ref 17–63)
AST: 63 U/L — ABNORMAL HIGH (ref 15–41)
Albumin: 3.3 g/dL — ABNORMAL LOW (ref 3.5–5.0)
Alkaline Phosphatase: 78 U/L (ref 38–126)
Anion gap: 13 (ref 5–15)
BUN: 18 mg/dL (ref 6–20)
CO2: 21 mmol/L — ABNORMAL LOW (ref 22–32)
Calcium: 9 mg/dL (ref 8.9–10.3)
Chloride: 99 mmol/L — ABNORMAL LOW (ref 101–111)
Creatinine, Ser: 1.95 mg/dL — ABNORMAL HIGH (ref 0.61–1.24)
GFR calc Af Amer: 42 mL/min — ABNORMAL LOW (ref >60)
GFR calc non Af Amer: 36 mL/min — ABNORMAL LOW (ref >60)
Glucose, Bld: 311 mg/dL — ABNORMAL HIGH (ref 65–99)
Potassium: 3.2 mmol/L — ABNORMAL LOW (ref 3.5–5.1)
Sodium: 133 mmol/L — ABNORMAL LOW (ref 135–145)
Total Bilirubin: 0.9 mg/dL (ref 0.3–1.2)
Total Protein: 8.4 g/dL — ABNORMAL HIGH (ref 6.5–8.1)

## 2018-02-19 LAB — I-STAT TROPONIN, ED
Troponin i, poc: 0.09 ng/mL (ref 0.00–0.08)
Troponin i, poc: 0.07 ng/mL (ref 0.00–0.08)

## 2018-02-19 LAB — LIPASE, BLOOD: Lipase: 39 U/L (ref 11–51)

## 2018-02-19 MED ORDER — CARVEDILOL 12.5 MG PO TABS: 12.5000 mg | ORAL_TABLET | Freq: Two times a day (BID) | ORAL | 3 refills | Status: DC

## 2018-02-19 MED ORDER — INSULIN ASPART 100 UNIT/ML ~~LOC~~ SOLN
10.0000 [IU] | Freq: Once | SUBCUTANEOUS | Status: AC
  Administered 2018-02-19: 10 [IU] via SUBCUTANEOUS

## 2018-02-19 MED ORDER — IOPAMIDOL (ISOVUE-300) INJECTION 61%
INTRAVENOUS | Status: AC
  Administered 2018-02-19: 75 mL
  Filled 2018-02-19: qty 100

## 2018-02-19 MED ORDER — GABAPENTIN 300 MG PO CAPS: 600.0000 mg | ORAL_CAPSULE | Freq: Two times a day (BID) | ORAL | 5 refills | Status: DC

## 2018-02-19 MED ORDER — POTASSIUM CHLORIDE CRYS ER 20 MEQ PO TBCR
20.0000 meq | EXTENDED_RELEASE_TABLET | Freq: Once | ORAL | Status: AC
Start: 2018-02-19 — End: 2018-02-19
  Administered 2018-02-19: 20 meq via ORAL
  Filled 2018-02-19: qty 1

## 2018-02-19 MED ORDER — SODIUM CHLORIDE 0.9 % IV BOLUS (SEPSIS)
1000.0000 mL | Freq: Once | INTRAVENOUS | Status: AC
  Administered 2018-02-19: 1000 mL via INTRAVENOUS

## 2018-02-19 MED ORDER — ONDANSETRON HCL 4 MG/2ML IJ SOLN
4.0000 mg | Freq: Once | INTRAMUSCULAR | Status: DC | PRN
  Filled 2018-02-19: qty 2

## 2018-02-19 MED FILL — GABAPENTIN 300 MG CAPSULE: 300 | 30 days supply | Qty: 120 | Fill #0

## 2018-02-19 MED FILL — CARVEDILOL 12.5 MG TABLET: 12.5 | 30 days supply | Qty: 60 | Fill #0
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
## 2018-02-19 NOTE — ED Notes (Signed)
Accidentally clicked of CT Abdomen W Contrast

## 2018-02-19 NOTE — ED Notes (Signed)
Pt reports that when visiting his PCP today they gave him something for BP and something for high sugar, pt states that he then had a syncopal episode r/t low BP. Pt A&O at this time

## 2018-02-19 NOTE — Telephone Encounter (Signed)
Patient called the office to inform both myself and PCP that he was currently in the ED. Patient stated that after taking his  medication (following his appointment today), he passed out at the [place] where he was waiting for the bus. EMS had to transport patient to [facility] ED because his bp was low. Patient believes it may be from taking carvedilol. Patient doesn't know if he will be admitted or not. Informed patient that I will be informing provider as well as [name], Tourist information centre manager.

## 2018-02-19 NOTE — Care Management Note (Signed)
Case Management Note  Patient Details  Name: Brad [name] MRN: [id number] Date of Birth: [birth year]  Subjective/Objective:                  57 yo male Syncopal episode after living PCP office earlier today, now "feeling bad all over." Pt is homeless- has been staying at [facility].   Action/Plan: Follow for disposition needs.  Expected Discharge Date:  (unknown)               Expected Discharge Plan:  Homeless Shelter  In-House Referral:  Clinical Social Work  Discharge planning Services  CM Consult   Status of Service:  In process, will continue to follow  If discussed at Long Length of Stay Meetings, dates discussed:    Additional Comments: PCP: Charlott Rakes ([place])  Fuller Mandril, RN 02/19/2018, 2:36 PM

## 2018-02-19 NOTE — ED Notes (Signed)
Pt given urinal aware that an urine sample is needed.
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
## 2018-02-19 NOTE — Discharge Instructions (Signed)
Please continue your regular medicines and stay well-hydrated.  Follow-up with your regular doctor and return if any worsening symptoms.

## 2018-02-19 NOTE — ED Provider Notes (Signed)
Patient placed in Quick Look pathway, seen and evaluated   Chief Complaint: syncope   HPI:   Syncopal episode after living PCP office earlier today, now "feeling bad all over". Thinks he may have hit his head when he passed out. Recently had stomach virus and continues to have intermittent abdominal pain and nausea since, but not currently. H/o illicit drug. H/o uncontrolled diabetes on insulin, CBG in 500s earlier today. PCP administered 10 units insulin in office before syncopal episode.   ROS: No fevers, chills, chest pain, SOB, palpitations. No abdominal pain or nausea currently.   Physical Exam:   Gen: No distress  Neuro: Awake and Alert  Skin: Warm    Focused Exam: Hypotensive BP 65-72/50s. Afebrile. RRR. Lungs CTAB. Abdomen soft NTND. Chronic right ankle edema, no calf tenderness.    Initiation of care has begun. The patient has been counseled on the process, plan, and necessity for staying for the completion/evaluation, and the remainder of the medical screening examination. I have requested patient gets placed in an acute bed due to blood pressure. Triage RN notified.     Kinnie Feil, PA-C 02/19/18 1355    Hayden Rasmussen, MD 02/20/18 [phone]

## 2018-02-19 NOTE — ED Triage Notes (Signed)
Pt presents to the ed with complaints of nausea and vomiting x 3 days.  Pt reports being diagnosed recently with the stomach bug and now feels weak all over. NAD in triage.

## 2018-02-19 NOTE — Progress Notes (Signed)
Consult request has been received. CSW attempting to follow up at present time.  [facility] ED CSW covering [facility] ED attempted to assist pt but CSW reviewed chart and pt was D/C'd.  Please reconsult if future social work needs arise.  CSW signing off, as social work intervention is no longer needed.  Alphonse Guild. Bernardina Cacho, LCSW, LCAS, CSI Clinical Social Worker Ph: [phone]

## 2018-02-22 ENCOUNTER — Encounter: Payer: Self-pay | Admitting: Pediatric Intensive Care

## 2018-02-22 ENCOUNTER — Telehealth: Payer: Self-pay
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
## 2018-02-22 NOTE — Telephone Encounter (Signed)
Message received from [facility] noting that the patient's BP this morning was 164/100.  She will re-check again tomorrow and talk to him about what medications he took on 02/19/18

## 2018-02-22 NOTE — Telephone Encounter (Signed)
At request of Dr Margarita Rana, call placed to Lisette Abu, RN [place] to check the patient's BP this am as he was in the ED on 02/19/18 after his appointment at [facility]. [name] stated that she will need to confirm the BP reading from this morning when she has access to his medical record later today. She said that she will recheck his BP tomorrow and will also confirm with him the dose of carevedilol he took on 02/19/18. She explained that he told her that he took the increased dose of carvedilol and then " fell out" [place].  He has no phone to contact him directly to discuss what happened and how much medication he took. .  She also noted that he explained to her his concerns about staying at the [facility] She said that she will ask him if he is interested in speaking to a behavioral health nurse.    As per Baldo Ash, [place], the patient picked up the prescription for carvedilol 12.5mg  on 02/19/18.   Update provided to Dr Margarita Rana. Will provide her with further information when it is received from [name].

## 2018-02-23 ENCOUNTER — Encounter: Payer: Self-pay | Admitting: Pediatric Intensive Care
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
## 2018-02-23 NOTE — Telephone Encounter (Signed)
Noted. He should be on 12.5mg  twice daily (not both at once) going forward.

## 2018-02-23 NOTE — Telephone Encounter (Signed)
Message received from [facility], [place] noting that the patient's BP this morning was 184/110.  She has reviewed his medications with him and also noted that he his stress level is very high and he is quite anxious about the CT scan on Thursday.  She also noted  that he took carvedilol 12.5 mg x 2 when he had the syncopal episode on Friday, 02/19/18.

## 2018-02-24 ENCOUNTER — Telehealth: Payer: Self-pay | Admitting: Pediatric Intensive Care

## 2018-02-24 ENCOUNTER — Telehealth: Payer: Self-pay

## 2018-02-24 ENCOUNTER — Telehealth: Payer: Self-pay | Admitting: Family Medicine

## 2018-02-24 NOTE — Telephone Encounter (Signed)
Call received form Lisette Abu, RN [place]. She was inquiring if the patient needed to have another CT abdomen /pelvis tomorrow because he just had one done on 02/19/18 when he was in the ED.  As per Dr Margarita Rana, the patient does not need another abdominal CT. She reviewed the CT report and stated that the patient  could be informed that the scan did not show any significantly enlarged lymph nodes. She will discuss the results in more detail with him at his next appointment.   The patient does not have a phone. The information from Dr Margarita Rana was shared with Emerson Monte, RN who will share with the patient.

## 2018-02-24 NOTE — Telephone Encounter (Signed)
Patient had scan done on 02/19/18 while in the ED. No prior Brad [name] is needed.

## 2018-02-24 NOTE — Telephone Encounter (Signed)
Cn contacted client to advise that his CT tomorrow has been cancelled. CN explained that CT from ED visit was sufficient per Dr Smitty Pluck opinion. CN advised client to call [facility] if he had any further questions or concerns.

## 2018-02-24 NOTE — Telephone Encounter (Signed)
The pre-auth called to request a pre-auth from [facility] for Ct scan that pt has tomorrow please call them back at  [phone]

## 2018-02-25 ENCOUNTER — Other Ambulatory Visit: Payer: Self-pay | Admitting: Family Medicine

## 2018-02-25 ENCOUNTER — Ambulatory Visit (HOSPITAL_COMMUNITY): Payer: Medicaid Other

## 2018-03-01 ENCOUNTER — Encounter: Payer: Self-pay | Admitting: Pediatric Intensive Care

## 2018-03-01 ENCOUNTER — Telehealth: Payer: Self-pay

## 2018-03-01 NOTE — Telephone Encounter (Signed)
Call received from [facility], [place] reporting that the patient's BP this morning was 192/112 and he states that he has been taking his medications as prescribed. She noted that he remains very stressed and told her that his son died of leukemia about 3 weeks ago. She was interested in behavioral health support for the patient. She plans to recheck his BP tomorrow- 03/02/18.   She also noted that she still needed to follow up with him about possible  legal aid assistance with his disability application

## 2018-03-01 NOTE — Telephone Encounter (Signed)
That is sad. Is he able to be referred for grief counseling with hospice?  Otherwise we could schedule with Jasmine.
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
## 2018-03-02 NOTE — Telephone Encounter (Signed)
Call received from the patient. He stated that he has been " doing okay."  He inquired about signing the legal aid referral and this CM explained the services that [employer] [place] provides for [facility] and that he does not need to sign the referral at this time. He stated that he is waiting for a decision about his disability application. He noted that he has been working with an Forensic psychologist and hopes to have a decision within 90 days. He said that he did not think that he needs the assistance from Legal Aid at this time. He also explained that the attorney has talked to him about seeing a behavioral health clinician but he said that he is not interested at this time.  He does know that it is an option for him and is also aware that [facility] has a SW on staff.  He explained how he is very anxious to get out of the [employer]. He said he spends his days trying to resist temptations as he knows that he does not want to return to prison. He said that he has been talking to friends who also help him to keep a positive outlook but it is very difficult. This CM commended him for his attempts to " out of trouble" as he is very resilient.

## 2018-03-16 ENCOUNTER — Inpatient Hospital Stay (HOSPITAL_COMMUNITY)
Admission: EM | Admit: 2018-03-16 | Discharge: 2018-03-18 | DRG: 917 | Disposition: A | Discharge status: Home / Self Care | Payer: Medicaid Other | Attending: Internal Medicine | Admitting: Internal Medicine

## 2018-03-16 ENCOUNTER — Encounter (HOSPITAL_COMMUNITY): Payer: Self-pay

## 2018-03-16 ENCOUNTER — Telehealth: Payer: Self-pay

## 2018-03-16 ENCOUNTER — Emergency Department (HOSPITAL_COMMUNITY): Payer: Medicaid Other

## 2018-03-16 ENCOUNTER — Other Ambulatory Visit: Payer: Self-pay

## 2018-03-16 DIAGNOSIS — Z8673 Personal history of transient ischemic attack (TIA), and cerebral infarction without residual deficits: Secondary | ICD-10-CM

## 2018-03-16 DIAGNOSIS — F17200 Nicotine dependence, unspecified, uncomplicated: Secondary | ICD-10-CM | POA: Diagnosis present

## 2018-03-16 DIAGNOSIS — E1069 Type 1 diabetes mellitus with other specified complication: Secondary | ICD-10-CM

## 2018-03-16 DIAGNOSIS — R071 Chest pain on breathing: Secondary | ICD-10-CM | POA: Diagnosis present

## 2018-03-16 DIAGNOSIS — I5042 Chronic combined systolic (congestive) and diastolic (congestive) heart failure: Secondary | ICD-10-CM | POA: Diagnosis present

## 2018-03-16 DIAGNOSIS — Z7901 Long term (current) use of anticoagulants: Secondary | ICD-10-CM

## 2018-03-16 DIAGNOSIS — I21A1 Myocardial infarction type 2: Secondary | ICD-10-CM | POA: Diagnosis present

## 2018-03-16 DIAGNOSIS — Z590 Homelessness: Secondary | ICD-10-CM

## 2018-03-16 DIAGNOSIS — E785 Hyperlipidemia, unspecified: Secondary | ICD-10-CM

## 2018-03-16 DIAGNOSIS — E1022 Type 1 diabetes mellitus with diabetic chronic kidney disease: Secondary | ICD-10-CM | POA: Diagnosis not present

## 2018-03-16 DIAGNOSIS — IMO0002 Reserved for concepts with insufficient information to code with codable children: Secondary | ICD-10-CM

## 2018-03-16 DIAGNOSIS — F1410 Cocaine abuse, uncomplicated: Secondary | ICD-10-CM | POA: Diagnosis not present

## 2018-03-16 DIAGNOSIS — Z96652 Presence of left artificial knee joint: Secondary | ICD-10-CM | POA: Diagnosis present

## 2018-03-16 DIAGNOSIS — E1165 Type 2 diabetes mellitus with hyperglycemia: Secondary | ICD-10-CM

## 2018-03-16 DIAGNOSIS — E1121 Type 2 diabetes mellitus with diabetic nephropathy: Secondary | ICD-10-CM

## 2018-03-16 DIAGNOSIS — I130 Hypertensive heart and chronic kidney disease with heart failure and stage 1 through stage 4 chronic kidney disease, or unspecified chronic kidney disease: Secondary | ICD-10-CM | POA: Diagnosis present

## 2018-03-16 DIAGNOSIS — K219 Gastro-esophageal reflux disease without esophagitis: Secondary | ICD-10-CM

## 2018-03-16 DIAGNOSIS — E876 Hypokalemia: Secondary | ICD-10-CM

## 2018-03-16 DIAGNOSIS — I214 Non-ST elevation (NSTEMI) myocardial infarction: Secondary | ICD-10-CM | POA: Diagnosis not present

## 2018-03-16 DIAGNOSIS — R748 Abnormal levels of other serum enzymes: Secondary | ICD-10-CM | POA: Diagnosis not present

## 2018-03-16 DIAGNOSIS — Z79899 Other long term (current) drug therapy: Secondary | ICD-10-CM

## 2018-03-16 DIAGNOSIS — Z888 Allergy status to other drugs, medicaments and biological substances status: Secondary | ICD-10-CM

## 2018-03-16 DIAGNOSIS — E1140 Type 2 diabetes mellitus with diabetic neuropathy, unspecified: Secondary | ICD-10-CM

## 2018-03-16 DIAGNOSIS — F1290 Cannabis use, unspecified, uncomplicated: Secondary | ICD-10-CM | POA: Diagnosis present

## 2018-03-16 DIAGNOSIS — R0789 Other chest pain: Secondary | ICD-10-CM

## 2018-03-16 DIAGNOSIS — N183 Chronic kidney disease, stage 3 (moderate): Secondary | ICD-10-CM | POA: Diagnosis present

## 2018-03-16 DIAGNOSIS — R079 Chest pain, unspecified: Secondary | ICD-10-CM

## 2018-03-16 DIAGNOSIS — I429 Cardiomyopathy, unspecified: Secondary | ICD-10-CM | POA: Diagnosis present

## 2018-03-16 DIAGNOSIS — T405X4A Poisoning by cocaine, undetermined, initial encounter: Principal | ICD-10-CM | POA: Diagnosis present

## 2018-03-16 DIAGNOSIS — E1065 Type 1 diabetes mellitus with hyperglycemia: Secondary | ICD-10-CM | POA: Diagnosis present

## 2018-03-16 DIAGNOSIS — Z794 Long term (current) use of insulin: Secondary | ICD-10-CM

## 2018-03-16 LAB — LIPID PANEL
Cholesterol: 201 mg/dL — ABNORMAL HIGH (ref 0–200)
HDL: 59 mg/dL (ref >40)
LDL Cholesterol: 125 mg/dL — ABNORMAL HIGH (ref 0–99)
Total CHOL/HDL Ratio: 3.4 RATIO
Triglycerides: 83 mg/dL (ref <150)
VLDL: 17 mg/dL (ref 0–40)

## 2018-03-16 LAB — TROPONIN I
Troponin I: 0.1 ng/mL (ref <0.03)
Troponin I: 0.11 ng/mL (ref <0.03)

## 2018-03-16 LAB — CBG MONITORING, ED
Glucose-Capillary: 303 mg/dL — ABNORMAL HIGH (ref 65–99)
Glucose-Capillary: 323 mg/dL — ABNORMAL HIGH (ref 65–99)

## 2018-03-16 LAB — CK: Total CK: 183 U/L (ref 49–397)

## 2018-03-16 LAB — CBC
HCT: 43.3 % (ref 39.0–52.0)
Hemoglobin: 14.3 g/dL (ref 13.0–17.0)
MCH: 30.7 pg (ref 26.0–34.0)
MCHC: 33 g/dL (ref 30.0–36.0)
MCV: 92.9 fL (ref 78.0–100.0)
Platelets: 190 10*3/uL (ref 150–400)
RBC: 4.66 MIL/uL (ref 4.22–5.81)
RDW: 12.2 % (ref 11.5–15.5)
WBC: 5 10*3/uL (ref 4.0–10.5)

## 2018-03-16 LAB — I-STAT TROPONIN, ED
Troponin i, poc: 0.12 ng/mL (ref 0.00–0.08)
Troponin i, poc: 0.2 ng/mL (ref 0.00–0.08)
Troponin i, poc: 0.14 ng/mL (ref 0.00–0.08)

## 2018-03-16 LAB — BASIC METABOLIC PANEL
Anion gap: 11 (ref 5–15)
BUN: 11 mg/dL (ref 6–20)
CO2: 28 mmol/L (ref 22–32)
Calcium: 8.7 mg/dL — ABNORMAL LOW (ref 8.9–10.3)
Chloride: 96 mmol/L — ABNORMAL LOW (ref 101–111)
Creatinine, Ser: 1.72 mg/dL — ABNORMAL HIGH (ref 0.61–1.24)
GFR calc Af Amer: 49 mL/min — ABNORMAL LOW (ref >60)
GFR calc non Af Amer: 42 mL/min — ABNORMAL LOW (ref >60)
Glucose, Bld: 307 mg/dL — ABNORMAL HIGH (ref 65–99)
Potassium: 3.3 mmol/L — ABNORMAL LOW (ref 3.5–5.1)
Sodium: 135 mmol/L (ref 135–145)

## 2018-03-16 LAB — RAPID URINE DRUG SCREEN, HOSP PERFORMED
Amphetamines: NOT DETECTED
Barbiturates: NOT DETECTED
Benzodiazepines: NOT DETECTED
Cocaine: POSITIVE — AB
Opiates: NOT DETECTED
Tetrahydrocannabinol: NOT DETECTED

## 2018-03-16 LAB — HEPARIN LEVEL (UNFRACTIONATED): Heparin Unfractionated: 0.18 IU/mL — ABNORMAL LOW (ref 0.30–0.70)

## 2018-03-16 LAB — BRAIN NATRIURETIC PEPTIDE: B Natriuretic Peptide: 205.8 pg/mL — ABNORMAL HIGH (ref 0.0–100.0)

## 2018-03-16 LAB — MAGNESIUM
Magnesium: 1.6 mg/dL — ABNORMAL LOW (ref 1.7–2.4)
Magnesium: 2.1 mg/dL (ref 1.7–2.4)

## 2018-03-16 LAB — PHOSPHORUS: Phosphorus: 2.5 mg/dL (ref 2.5–4.6)

## 2018-03-16 LAB — HEMOGLOBIN A1C
Hgb A1c MFr Bld: 11.8 % — ABNORMAL HIGH (ref 4.8–5.6)
Mean Plasma Glucose: 291.96 mg/dL

## 2018-03-16 MED ORDER — PANTOPRAZOLE SODIUM 40 MG PO TBEC
40.0000 mg | DELAYED_RELEASE_TABLET | Freq: Every day | ORAL | Status: DC
  Administered 2018-03-16 – 2018-03-18 (×3): 40 mg via ORAL
  Filled 2018-03-16 (×3): qty 1

## 2018-03-16 MED ORDER — HEPARIN BOLUS VIA INFUSION
2000.0000 [IU] | Freq: Once | INTRAVENOUS | Status: AC
  Administered 2018-03-16: 2000 [IU] via INTRAVENOUS
  Filled 2018-03-16: qty 2000

## 2018-03-16 MED ORDER — INSULIN ASPART 100 UNIT/ML ~~LOC~~ SOLN
0.0000 [IU] | Freq: Three times a day (TID) | SUBCUTANEOUS | Status: DC
  Administered 2018-03-17: 2 [IU] via SUBCUTANEOUS
  Administered 2018-03-17: 3 [IU] via SUBCUTANEOUS
  Administered 2018-03-17: 5 [IU] via SUBCUTANEOUS
  Administered 2018-03-18: 3 [IU] via SUBCUTANEOUS

## 2018-03-16 MED ORDER — ASPIRIN 325 MG PO TABS
325.0000 mg | ORAL_TABLET | Freq: Every day | ORAL | Status: DC
  Administered 2018-03-16 – 2018-03-18 (×3): 325 mg via ORAL
  Filled 2018-03-16 (×3): qty 1

## 2018-03-16 MED ORDER — ATORVASTATIN CALCIUM 40 MG PO TABS
40.0000 mg | ORAL_TABLET | Freq: Every day | ORAL | Status: DC
  Administered 2018-03-17 – 2018-03-18 (×2): 40 mg via ORAL
  Filled 2018-03-16 (×4): qty 1

## 2018-03-16 MED ORDER — INSULIN GLARGINE 100 UNIT/ML ~~LOC~~ SOLN: 10.0000 [IU] | Freq: Two times a day (BID) | SUBCUTANEOUS | Status: DC

## 2018-03-16 MED ORDER — HEPARIN BOLUS VIA INFUSION
4000.0000 [IU] | Freq: Once | INTRAVENOUS | Status: DC
  Filled 2018-03-16: qty 4000

## 2018-03-16 MED ORDER — POTASSIUM CHLORIDE 10 MEQ/100ML IV SOLN
10.0000 meq | INTRAVENOUS | Status: AC
  Administered 2018-03-16 (×2): 10 meq via INTRAVENOUS
  Filled 2018-03-16 (×2): qty 100

## 2018-03-16 MED ORDER — SODIUM CHLORIDE 0.9 % IV SOLN
INTRAVENOUS | Status: DC
  Administered 2018-03-16: 13:00:00 via INTRAVENOUS

## 2018-03-16 MED ORDER — HEPARIN (PORCINE) IN NACL 100-0.45 UNIT/ML-% IJ SOLN
1150.0000 [IU]/h | INTRAMUSCULAR | Status: DC
  Administered 2018-03-16: 1150 [IU]/h via INTRAVENOUS
  Filled 2018-03-16: qty 250

## 2018-03-16 MED ORDER — HEPARIN (PORCINE) IN NACL 100-0.45 UNIT/ML-% IJ SOLN
1450.0000 [IU]/h | INTRAMUSCULAR | Status: DC
Start: 2018-03-16 — End: 2018-03-17
  Administered 2018-03-16: 1300 [IU]/h via INTRAVENOUS
  Filled 2018-03-16: qty 250

## 2018-03-16 MED ORDER — MAGNESIUM SULFATE IN D5W 1-5 GM/100ML-% IV SOLN
1.0000 g | Freq: Once | INTRAVENOUS | Status: AC
  Administered 2018-03-16: 1 g via INTRAVENOUS
  Filled 2018-03-16: qty 100

## 2018-03-16 MED ORDER — GABAPENTIN 300 MG PO CAPS
600.0000 mg | ORAL_CAPSULE | Freq: Two times a day (BID) | ORAL | Status: DC
  Administered 2018-03-16 – 2018-03-18 (×4): 600 mg via ORAL
  Filled 2018-03-16 (×4): qty 2

## 2018-03-16 MED ORDER — CLONIDINE HCL 0.1 MG PO TABS
0.1000 mg | ORAL_TABLET | Freq: Two times a day (BID) | ORAL | Status: DC
Start: 2018-03-16 — End: 2018-03-18
  Administered 2018-03-16 – 2018-03-18 (×4): 0.1 mg via ORAL
  Filled 2018-03-16 (×4): qty 1

## 2018-03-16 MED ORDER — MORPHINE SULFATE (PF) 4 MG/ML IV SOLN
4.0000 mg | Freq: Once | INTRAVENOUS | Status: AC
  Administered 2018-03-16: 4 mg via INTRAVENOUS
  Filled 2018-03-16: qty 1

## 2018-03-16 MED ORDER — AMITRIPTYLINE HCL 75 MG PO TABS
75.0000 mg | ORAL_TABLET | Freq: Every day | ORAL | Status: DC
  Administered 2018-03-16 – 2018-03-17 (×2): 75 mg via ORAL
  Filled 2018-03-16: qty 1
  Filled 2018-03-16: qty 3

## 2018-03-16 MED ORDER — AMLODIPINE BESYLATE 10 MG PO TABS
10.0000 mg | ORAL_TABLET | Freq: Every day | ORAL | Status: DC
  Administered 2018-03-16 – 2018-03-18 (×3): 10 mg via ORAL
  Filled 2018-03-16 (×2): qty 1
  Filled 2018-03-16: qty 2
  Filled 2018-03-16: qty 1

## 2018-03-16 MED ORDER — INSULIN GLARGINE 100 UNIT/ML SOLOSTAR PEN: 60.0000 [IU] | PEN_INJECTOR | Freq: Every day | SUBCUTANEOUS | Status: DC

## 2018-03-16 MED ORDER — NITROGLYCERIN IN D5W 200-5 MCG/ML-% IV SOLN
0.0000 ug/min | INTRAVENOUS | Status: DC
  Administered 2018-03-16: 5 ug/min via INTRAVENOUS
  Filled 2018-03-16: qty 250

## 2018-03-16 MED ORDER — INSULIN ASPART 100 UNIT/ML ~~LOC~~ SOLN
0.0000 [IU] | Freq: Every day | SUBCUTANEOUS | Status: DC
  Administered 2018-03-16: 4 [IU] via SUBCUTANEOUS
  Administered 2018-03-17: 3 [IU] via SUBCUTANEOUS
  Filled 2018-03-16: qty 1

## 2018-03-16 MED ORDER — ACETAMINOPHEN 325 MG PO TABS
650.0000 mg | ORAL_TABLET | Freq: Four times a day (QID) | ORAL | Status: DC | PRN
  Administered 2018-03-16: 650 mg via ORAL
  Filled 2018-03-16 (×2): qty 2

## 2018-03-16 MED ORDER — INSULIN GLARGINE 100 UNIT/ML ~~LOC~~ SOLN
25.0000 [IU] | Freq: Two times a day (BID) | SUBCUTANEOUS | Status: DC
  Administered 2018-03-16 – 2018-03-17 (×3): 25 [IU] via SUBCUTANEOUS
  Filled 2018-03-16 (×6): qty 0.25

## 2018-03-16 MED ORDER — CARVEDILOL 12.5 MG PO TABS
12.5000 mg | ORAL_TABLET | Freq: Two times a day (BID) | ORAL | Status: DC
  Administered 2018-03-17 – 2018-03-18 (×3): 12.5 mg via ORAL
  Filled 2018-03-16 (×3): qty 1
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
## 2018-03-16 NOTE — ED Notes (Signed)
Pt states unable to void at this time, made aware of need for urine sample

## 2018-03-16 NOTE — ED Notes (Signed)
Checked CBG 323, RN Bobby informed of CBG

## 2018-03-16 NOTE — ED Notes (Signed)
Pt requested RN look at two, bandage on toe appeared to be covered in blood.  NT changed bandaged.  Pt comfortable and states he is going to rest.
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
## 2018-03-16 NOTE — ED Notes (Signed)
Patient transported to X-ray 

## 2018-03-16 NOTE — ED Triage Notes (Signed)
Pt brought in by [facility] from [facility] for nonradiating substernal chest pressure and SOB since this am. Pt initially rated pain 10/10, given x2 nitro PTA, pain now 6/10. Pt has hx of CHF and is a type 1 diabetic, CBG 303 on arrival to ED. Pt states had a similar episode of chest pressure a few months ago, states he was admitted to hospital for afib. Pt currently in SR on the monitor. Pt given 324mg  aspirin PTA. Pt A+Ox4 and in no apparent distress at this time.
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
## 2018-03-16 NOTE — Telephone Encounter (Signed)
Call received from the patient stating that he is being admitted to the hospital as he has been experiencing chest pain.  He explained his frustrations with living at the [facility] but did note that he recently got a job working part time at [employer].
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
## 2018-03-17 DIAGNOSIS — F1410 Cocaine abuse, uncomplicated: Secondary | ICD-10-CM | POA: Diagnosis present

## 2018-03-17 DIAGNOSIS — I130 Hypertensive heart and chronic kidney disease with heart failure and stage 1 through stage 4 chronic kidney disease, or unspecified chronic kidney disease: Secondary | ICD-10-CM | POA: Diagnosis present

## 2018-03-17 DIAGNOSIS — I429 Cardiomyopathy, unspecified: Secondary | ICD-10-CM | POA: Diagnosis present

## 2018-03-17 DIAGNOSIS — I159 Secondary hypertension, unspecified: Secondary | ICD-10-CM

## 2018-03-17 DIAGNOSIS — Z888 Allergy status to other drugs, medicaments and biological substances status: Secondary | ICD-10-CM | POA: Diagnosis not present

## 2018-03-17 DIAGNOSIS — E876 Hypokalemia: Secondary | ICD-10-CM | POA: Diagnosis present

## 2018-03-17 DIAGNOSIS — E1022 Type 1 diabetes mellitus with diabetic chronic kidney disease: Secondary | ICD-10-CM | POA: Diagnosis present

## 2018-03-17 DIAGNOSIS — Z590 Homelessness: Secondary | ICD-10-CM | POA: Diagnosis not present

## 2018-03-17 DIAGNOSIS — E1165 Type 2 diabetes mellitus with hyperglycemia: Secondary | ICD-10-CM

## 2018-03-17 DIAGNOSIS — Z794 Long term (current) use of insulin: Secondary | ICD-10-CM

## 2018-03-17 DIAGNOSIS — I21A1 Myocardial infarction type 2: Secondary | ICD-10-CM | POA: Diagnosis present

## 2018-03-17 DIAGNOSIS — Z8673 Personal history of transient ischemic attack (TIA), and cerebral infarction without residual deficits: Secondary | ICD-10-CM | POA: Diagnosis not present

## 2018-03-17 DIAGNOSIS — I10 Essential (primary) hypertension: Secondary | ICD-10-CM | POA: Diagnosis not present

## 2018-03-17 DIAGNOSIS — E1065 Type 1 diabetes mellitus with hyperglycemia: Secondary | ICD-10-CM | POA: Diagnosis present

## 2018-03-17 DIAGNOSIS — T405X4A Poisoning by cocaine, undetermined, initial encounter: Secondary | ICD-10-CM | POA: Diagnosis present

## 2018-03-17 DIAGNOSIS — N183 Chronic kidney disease, stage 3 (moderate): Secondary | ICD-10-CM | POA: Diagnosis present

## 2018-03-17 DIAGNOSIS — I214 Non-ST elevation (NSTEMI) myocardial infarction: Secondary | ICD-10-CM | POA: Diagnosis present

## 2018-03-17 DIAGNOSIS — Z96652 Presence of left artificial knee joint: Secondary | ICD-10-CM | POA: Diagnosis present

## 2018-03-17 DIAGNOSIS — E785 Hyperlipidemia, unspecified: Secondary | ICD-10-CM | POA: Diagnosis present

## 2018-03-17 DIAGNOSIS — Z7901 Long term (current) use of anticoagulants: Secondary | ICD-10-CM | POA: Diagnosis not present

## 2018-03-17 DIAGNOSIS — F17200 Nicotine dependence, unspecified, uncomplicated: Secondary | ICD-10-CM | POA: Diagnosis present

## 2018-03-17 DIAGNOSIS — Z79899 Other long term (current) drug therapy: Secondary | ICD-10-CM | POA: Diagnosis not present

## 2018-03-17 DIAGNOSIS — R0789 Other chest pain: Secondary | ICD-10-CM | POA: Diagnosis not present

## 2018-03-17 DIAGNOSIS — I5042 Chronic combined systolic (congestive) and diastolic (congestive) heart failure: Secondary | ICD-10-CM | POA: Diagnosis present

## 2018-03-17 DIAGNOSIS — E1121 Type 2 diabetes mellitus with diabetic nephropathy: Secondary | ICD-10-CM | POA: Diagnosis not present

## 2018-03-17 DIAGNOSIS — F1290 Cannabis use, unspecified, uncomplicated: Secondary | ICD-10-CM | POA: Diagnosis present

## 2018-03-17 LAB — GLUCOSE, CAPILLARY
Glucose-Capillary: 173 mg/dL — ABNORMAL HIGH (ref 65–99)
Glucose-Capillary: 258 mg/dL — ABNORMAL HIGH (ref 65–99)
Glucose-Capillary: 211 mg/dL — ABNORMAL HIGH (ref 65–99)
Glucose-Capillary: 203 mg/dL — ABNORMAL HIGH (ref 65–99)

## 2018-03-17 LAB — COMPREHENSIVE METABOLIC PANEL
ALT: 59 U/L (ref 17–63)
AST: 63 U/L — ABNORMAL HIGH (ref 15–41)
Albumin: 2.8 g/dL — ABNORMAL LOW (ref 3.5–5.0)
Alkaline Phosphatase: 71 U/L (ref 38–126)
Anion gap: 11 (ref 5–15)
BUN: 16 mg/dL (ref 6–20)
CO2: 27 mmol/L (ref 22–32)
Calcium: 8.3 mg/dL — ABNORMAL LOW (ref 8.9–10.3)
Chloride: 95 mmol/L — ABNORMAL LOW (ref 101–111)
Creatinine, Ser: 1.8 mg/dL — ABNORMAL HIGH (ref 0.61–1.24)
GFR calc Af Amer: 46 mL/min — ABNORMAL LOW (ref >60)
GFR calc non Af Amer: 40 mL/min — ABNORMAL LOW (ref >60)
Glucose, Bld: 165 mg/dL — ABNORMAL HIGH (ref 65–99)
Potassium: 3.2 mmol/L — ABNORMAL LOW (ref 3.5–5.1)
Sodium: 133 mmol/L — ABNORMAL LOW (ref 135–145)
Total Bilirubin: 1.2 mg/dL (ref 0.3–1.2)
Total Protein: 7.1 g/dL (ref 6.5–8.1)

## 2018-03-17 LAB — CBC
HCT: 39.4 % (ref 39.0–52.0)
Hemoglobin: 13.5 g/dL (ref 13.0–17.0)
MCH: 31.3 pg (ref 26.0–34.0)
MCHC: 34.3 g/dL (ref 30.0–36.0)
MCV: 91.2 fL (ref 78.0–100.0)
Platelets: 161 10*3/uL (ref 150–400)
RBC: 4.32 MIL/uL (ref 4.22–5.81)
RDW: 11.7 % (ref 11.5–15.5)
WBC: 4.9 10*3/uL (ref 4.0–10.5)

## 2018-03-17 LAB — TROPONIN I: Troponin I: 0.11 ng/mL (ref <0.03)

## 2018-03-17 LAB — HEPARIN LEVEL (UNFRACTIONATED): Heparin Unfractionated: 0.26 IU/mL — ABNORMAL LOW (ref 0.30–0.70)

## 2018-03-17 LAB — MRSA PCR SCREENING: MRSA by PCR: NEGATIVE

## 2018-03-17 MED ORDER — CAPSAICIN 0.025 % EX CREA
TOPICAL_CREAM | Freq: Two times a day (BID) | CUTANEOUS | Status: DC
  Administered 2018-03-17 (×2): via TOPICAL
  Filled 2018-03-17: qty 60

## 2018-03-17 MED ORDER — POTASSIUM CHLORIDE CRYS ER 20 MEQ PO TBCR
40.0000 meq | EXTENDED_RELEASE_TABLET | Freq: Once | ORAL | Status: AC
  Administered 2018-03-17: 40 meq via ORAL
  Filled 2018-03-17: qty 2

## 2018-03-17 NOTE — Progress Notes (Signed)
PROGRESS NOTE    Brad [name]  UUV:[id number] DOB: 07/06/1961 DOA: 03/16/2018 PCP: Charlott Rakes, MD   Outpatient Specialists:     Brief Narrative:  Patient is a 57 year old African-American male with past medical history significant for combined systolic and diastolic congestive heart failure, with an EF of 35-40%; hypertension, chronic kidney disease stage III, diabetes mellitus, hyperlipidemia amongst other past medical problems.  Patient presents with chest pain that started early this morning.  According to the patient, the chest pain was pressure-like, and felt as if somebody was sitting on his chest.  His pain was rated 10 out of 10, and was persistent until patient presented to the hospital and receive treatment.  The chest pain did not radiate to any extremities or neck.  Associated shortness of breath and diaphoresis.  On presentation to the hospital, the first troponin was 0.120-second troponin was 0.20.  Cardiac BNP was 205.  EKG revealed poor R wave progression.  UDS was positive for cocaine.  No headache, no neck pain, no fever or chills, no GI symptoms and no urinary symptoms.  Patient be admitted for further assessment and management.     Assessment & Plan:   Active Problems:   Chest pain   Chest pain - likely cocaine induced/HTN -had atypical features-- worse with deep breath and palpation -d/c heparin and nitro gtt -CE flat -Aspirin. -cardiology: no further work up, medical optimization -add topical capsacin and avoid narcotics   Cocaine abuse: -Counseled to quit cocaine.  Diabetes mellitus, uncontrolled: -HgbA1c: 11.8--- doubt taking medications at home.  Hypokalemia: -replete Mg ok  Dyslipidemia: -LDL 125 -statin  Hypertension: -Continue to optimize.  Chronic kidney disease stage III: -Stable. -Continue to monitor. -Avoid nephrotoxic's.     DVT prophylaxis:  Fully anticoagulated  Code Status: Full Code   Family  Communication:   Disposition Plan:     Consultants:  cards  Subjective: C/p chest pain--- worse with deep breathing and palpation  Objective: Vitals:   03/17/18 0530 03/17/18 0600 03/17/18 0651 03/17/18 0809  BP: 101/74 127/83  97/64  Pulse: 67 75  71  Resp: 12 (!) 21  (!) 23  Temp:    (!) 97.5 F (36.4 C)  TempSrc:    Oral  SpO2: 96% 97%  98%  Weight:   87.2 kg (192 lb 3.9 oz)   Height:   5\' 11"  (1.803 m)     Intake/Output Summary (Last 24 hours) at 03/17/2018 1102 Last data filed at 03/17/2018 0900 Gross per 24 hour  Intake 350 ml  Output -  Net 350 ml   Filed Weights   03/16/18 1146 03/17/18 0651  Weight: 88.5 kg (195 lb) 87.2 kg (192 lb 3.9 oz)    Examination:  General exam: Appears calm and comfortable  Respiratory system: Clear to auscultation. Respiratory effort normal. Cardiovascular system: S1 & S2 heard, RRR. No JVD, murmurs, rubs, gallops or clicks. No pedal edema. Gastrointestinal system: Abdomen is nondistended, soft and nontender. No organomegaly or masses felt. Normal bowel sounds heard. Central nervous system: Alert and oriented. No focal neurological deficits. Extremities: Symmetric 5 x 5 power. Skin: No rashes, lesions or ulcers Psychiatry: Judgement and insight appear normal. Mood & affect appropriate.     Data Reviewed: I have personally reviewed following labs and imaging studies  CBC: Recent Labs  Lab 03/16/18 1152 03/17/18 0701  WBC 5.0 4.9  HGB 14.3 13.5  HCT 43.3 39.4  MCV 92.9 91.2  PLT 190 474   Basic Metabolic Panel:  Recent Labs  Lab 03/16/18 1152 03/16/18 1435 03/16/18 1945 03/17/18 0701  NA 135  --   --  133*  K 3.3*  --   --  3.2*  CL 96*  --   --  95*  CO2 28  --   --  27  GLUCOSE 307*  --   --  165*  BUN 11  --   --  16  CREATININE 1.72*  --   --  1.80*  CALCIUM 8.7*  --   --  8.3*  MG  --  1.6* 2.1  --   PHOS  --   --  2.5  --    GFR: Estimated Creatinine Clearance: 47.6 mL/min (A) (by C-G formula  based on SCr of 1.8 mg/dL (H)). Liver Function Tests: Recent Labs  Lab 03/17/18 0701  AST 63*  ALT 59  ALKPHOS 71  BILITOT 1.2  PROT 7.1  ALBUMIN 2.8*   No results for input(s): LIPASE, AMYLASE in the last 168 hours. No results for input(s): AMMONIA in the last 168 hours. Coagulation Profile: No results for input(s): INR, PROTIME in the last 168 hours. Cardiac Enzymes: Recent Labs  Lab 03/16/18 1945 03/16/18 2150 03/17/18 0701  CKTOTAL 183  --   --   TROPONINI 0.10* 0.11* 0.11*   BNP (last 3 results) No results for input(s): PROBNP in the last 8760 hours. HbA1C: Recent Labs    03/16/18 1905  HGBA1C 11.8*   CBG: Recent Labs  Lab 03/16/18 1140 03/16/18 2127 03/17/18 0811  GLUCAP 303* 323* 173*   Lipid Profile: Recent Labs    03/16/18 1759  CHOL 201*  HDL 59  LDLCALC 125*  TRIG 83  CHOLHDL 3.4   Thyroid Function Tests: No results for input(s): TSH, T4TOTAL, FREET4, T3FREE, THYROIDAB in the last 72 hours. Anemia Panel: No results for input(s): VITAMINB12, FOLATE, FERRITIN, TIBC, IRON, RETICCTPCT in the last 72 hours. Urine analysis:    Component Value Date/Time   COLORURINE YELLOW 11/15/2017 0338   APPEARANCEUR CLEAR 11/15/2017 0338   LABSPEC 1.006 11/15/2017 0338   PHURINE 5.0 11/15/2017 0338   GLUCOSEU 150 (A) 11/15/2017 0338   HGBUR SMALL (A) 11/15/2017 0338   BILIRUBINUR NEGATIVE 11/15/2017 0338   BILIRUBINUR neg 05/08/2017 1503   KETONESUR NEGATIVE 11/15/2017 0338   PROTEINUR 100 (A) 11/15/2017 0338   UROBILINOGEN 0.2 05/08/2017 1503   UROBILINOGEN 1.0 04/26/2012 1328   NITRITE NEGATIVE 11/15/2017 0338   LEUKOCYTESUR NEGATIVE 11/15/2017 0338   Sepsis Labs: @LABRCNTIP (procalcitonin:4,lacticidven:4)  ) Recent Results (from the past 240 hour(s))  MRSA PCR Screening     Status: None   Collection Time: 03/17/18  7:03 AM  Result Value Ref Range Status   MRSA by PCR NEGATIVE NEGATIVE Final    Comment:        The GeneXpert MRSA Assay  (FDA approved for NASAL specimens only), is one component of a comprehensive MRSA colonization surveillance program. It is not intended to diagnose MRSA infection nor to guide or monitor treatment for MRSA infections. Performed at [facility], [place] [address], [place], [place] [address]       Anti-infectives (From admission, onward)   None       Radiology Studies: Dg Chest 2 View  Result Date: 03/16/2018 CLINICAL DATA:  Chest pain and shortness of breath. EXAM: CHEST - 2 VIEW COMPARISON:  Chest x-ray dated February 19, 2018. FINDINGS: The heart size remains borderline enlarged. Normal pulmonary vascularity. No focal consolidation, pleural effusion, or  pneumothorax. No acute osseous abnormality. IMPRESSION: No active cardiopulmonary disease. Electronically Signed   By: Titus Dubin M.D.   On: 03/16/2018 12:20        Scheduled Meds: . amitriptyline  75 mg Oral QHS  . amLODipine  10 mg Oral Daily  . aspirin  325 mg Oral Daily  . atorvastatin  40 mg Oral Daily  . carvedilol  12.5 mg Oral BID WC  . cloNIDine  0.1 mg Oral BID  . gabapentin  600 mg Oral BID  . insulin aspart  0-5 Units Subcutaneous QHS  . insulin aspart  0-9 Units Subcutaneous TID WC  . insulin glargine  25 Units Subcutaneous BID  . pantoprazole  40 mg Oral Daily   Continuous Infusions: . sodium chloride 10 mL/hr at 03/16/18 1855  . heparin 1,450 Units/hr (03/17/18 1014)  . nitroGLYCERIN 20 mcg/min (03/16/18 1855)     LOS: 0 days    Time spent: 35 min    Geradine Girt, DO [facility] Pager [phone]  If 7PM-7AM, please contact night-coverage [email or web address] Password [email or web address] 03/17/2018, 11:02 AM
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
## 2018-03-17 NOTE — Progress Notes (Signed)
   Dr. Thompson Caul note reviewed.  Troponin remains low level and flat Continue with medical tx and cocaine abstinence.   No further recs.   Candee Furbish, MD

## 2018-03-17 NOTE — Hospital Discharge Follow-Up (Signed)
Met with the patient today as he is known to the [facility] transitional care clinic. He currently is residing at the [facility] and waiting for a decision on his disability application.  Lisette Abu, RN [facility] has also been following the patient.  A follow up appointment at [facility] has been scheduled for 03/23/18 @ 1000 and the information has been placed on the AVS.  Elenor Quinones, RN CM notified that an appointment has been scheduled.

## 2018-03-18 DIAGNOSIS — E1121 Type 2 diabetes mellitus with diabetic nephropathy: Secondary | ICD-10-CM

## 2018-03-18 DIAGNOSIS — I10 Essential (primary) hypertension: Secondary | ICD-10-CM

## 2018-03-18 LAB — GLUCOSE, CAPILLARY: Glucose-Capillary: 222 mg/dL — ABNORMAL HIGH (ref 65–99)

## 2018-03-18 LAB — CBC
HCT: 39.2 % (ref 39.0–52.0)
Hemoglobin: 13.4 g/dL (ref 13.0–17.0)
MCH: 31.5 pg (ref 26.0–34.0)
MCHC: 34.2 g/dL (ref 30.0–36.0)
MCV: 92 fL (ref 78.0–100.0)
Platelets: 163 10*3/uL (ref 150–400)
RBC: 4.26 MIL/uL (ref 4.22–5.81)
RDW: 11.9 % (ref 11.5–15.5)
WBC: 5.1 10*3/uL (ref 4.0–10.5)

## 2018-03-18 LAB — BASIC METABOLIC PANEL
Anion gap: 9 (ref 5–15)
BUN: 17 mg/dL (ref 6–20)
CO2: 26 mmol/L (ref 22–32)
Calcium: 8.4 mg/dL — ABNORMAL LOW (ref 8.9–10.3)
Chloride: 98 mmol/L — ABNORMAL LOW (ref 101–111)
Creatinine, Ser: 1.85 mg/dL — ABNORMAL HIGH (ref 0.61–1.24)
GFR calc Af Amer: 45 mL/min — ABNORMAL LOW (ref >60)
GFR calc non Af Amer: 39 mL/min — ABNORMAL LOW (ref >60)
Glucose, Bld: 297 mg/dL — ABNORMAL HIGH (ref 65–99)
Potassium: 3.6 mmol/L (ref 3.5–5.1)
Sodium: 133 mmol/L — ABNORMAL LOW (ref 135–145)

## 2018-03-18 MED ORDER — OMEPRAZOLE 20 MG PO CPDR: 20.0000 mg | DELAYED_RELEASE_CAPSULE | Freq: Every day | ORAL | 3 refills | Status: DC

## 2018-03-18 MED ORDER — ASPIRIN 325 MG PO TABS: 325.0000 mg | ORAL_TABLET | Freq: Every day | ORAL | Status: DC

## 2018-03-18 MED ORDER — GABAPENTIN 300 MG PO CAPS: 600.0000 mg | ORAL_CAPSULE | Freq: Two times a day (BID) | ORAL | 0 refills | Status: DC

## 2018-03-18 MED ORDER — INSULIN GLARGINE 100 UNIT/ML ~~LOC~~ SOLN
30.0000 [IU] | Freq: Two times a day (BID) | SUBCUTANEOUS | Status: DC
  Filled 2018-03-18: qty 0.3

## 2018-03-18 NOTE — Progress Notes (Signed)
Inpatient Diabetes Program Recommendations  AACE/ADA: New Consensus Statement on Inpatient Glycemic Control (2015)  Target Ranges:  Prepandial:   less than 140 mg/dL      Peak postprandial:   less than 180 mg/dL (1-2 hours)      Critically ill patients:  140 - 180 mg/dL   Lab Results  Component Value Date   GLUCAP 203 (H) 03/17/2018   HGBA1C 11.8 (H) 03/16/2018    Review of Glycemic Control Results for Brad [name], Brad [name] (MRN [id number]) as of 03/18/2018 08:03  Ref. Range 03/17/2018 08:11 03/17/2018 12:30 03/17/2018 16:56 03/17/2018 21:09  Glucose-Capillary Latest Ref Range: 65 - 99 mg/dL 173 (H) 258 (H) 211 (H) 203 (H)   Diabetes history: DM2 Outpatient Diabetes medications: Lantus 60 qd Current orders for Inpatient glycemic control: Lantus 30 units bid + Novolog sensitive tid + hs  Inpatient Diabetes Program Recommendations:   -Add Novolog 3 units tid meal coverage if eats 50%  Thank you, Bethena Roys E. Leva Baine, RN, MSN, CDE  Diabetes Coordinator Inpatient Glycemic Control Team Team Pager [phone] (8am-5pm) 03/18/2018 8:15 AM

## 2018-03-18 NOTE — Progress Notes (Signed)
Pt discharged home per MD order, all discharge instructions reviewed and all questions answered.  

## 2018-03-18 NOTE — Progress Notes (Signed)
Responded to scc to assist patient with AD.  Patient being discharge and wanted to take with him.  Went over AD with patient and provided guidance on how to proceed.   Jaclynn Major, [place], [facility], Pager [phone]

## 2018-03-18 NOTE — Discharge Summary (Signed)
Physician Discharge Summary  Brad [name]:[id number] DOB: 06/13/1961 DOA: 03/16/2018  PCP: Brad Rakes, MD  Admit date: 03/16/2018 Discharge date: 03/18/2018   Recommendations for Outpatient Follow-Up:   1. Cocaine cessation 2. Needs risk factor modification   Discharge Diagnosis:   Active Problems:   Chest pain   Discharge disposition:  Home.  Discharge Condition: Improved.  Diet recommendation: Low sodium, heart healthy.  Carbohydrate-modified  Wound care: None.   History of Present Illness:   Patient is a 57 year old African-American male with past medical history significant for combined systolic and diastolic congestive heart failure, with an EF of 35-40%; hypertension, chronic kidney disease stage III, diabetes mellitus, hyperlipidemia amongst other past medical problems.  Patient presents with chest pain that started early this morning.  According to the patient, the chest pain was pressure-like, and felt as if somebody was sitting on his chest.  His pain was rated 10 out of 10, and was persistent until patient presented to the hospital and receive treatment.  The chest pain did not radiate to any extremities or neck.  Associated shortness of breath and diaphoresis.  On presentation to the hospital, the first troponin was 0.120-second troponin was 0.20.  Cardiac BNP was 205.  EKG revealed poor R wave progression.  UDS was positive for cocaine.  No headache, no neck pain, no fever or chills, no GI symptoms and no urinary symptoms.  Patient be admitted for further assessment and management.     Hospital Course by Problem:   Chest pain - likely cocaine induced/HTN -had atypical features-- worse with deep breath and palpation -d/c heparin and nitro gtt with improvement in chest pain -CE flat -Aspirin. -cardiology: no further work up, medical optimization -add topical pain control and avoid narcotics   Cocaine abuse: -Counseled to quit  cocaine.  Diabetes mellitus, uncontrolled: -HgbA1c: 11.8--- doubt taking medications at home. -encouraged complaince and close outpatient follow up  Hypokalemia: -repleted Mg ok  Dyslipidemia: -LDL 125 -statin  Hypertension: -Continue to optimize.  Chronic kidney disease stage III: -Stable. -Continue to monitor. -Avoid nephrotoxic's.       Medical Consultants:    cards   Discharge Exam:   Vitals:   03/18/18 0300 03/18/18 0335  BP: 125/84   Pulse: 71   Resp: 15   Temp:  98.5 F (36.9 C)  SpO2:     Vitals:   03/17/18 2327 03/18/18 0300 03/18/18 0335 03/18/18 0444  BP: 125/75 125/84    Pulse: 71 71    Resp: 14 15    Temp: 98.6 F (37 C)  98.5 F (36.9 C)   TempSrc: Oral  Oral   SpO2: 97%     Weight:    88 kg (194 lb 1.6 oz)  Height:        Gen:  NAD   The results of significant diagnostics from this hospitalization (including imaging, microbiology, ancillary and laboratory) are listed below for reference.     Procedures and Diagnostic Studies:   Dg Chest 2 View  Result Date: 03/16/2018 CLINICAL DATA:  Chest pain and shortness of breath. EXAM: CHEST - 2 VIEW COMPARISON:  Chest x-ray dated February 19, 2018. FINDINGS: The heart size remains borderline enlarged. Normal pulmonary vascularity. No focal consolidation, pleural effusion, or pneumothorax. No acute osseous abnormality. IMPRESSION: No active cardiopulmonary disease. Electronically Signed   By: Titus Dubin M.D.   On: 03/16/2018 12:20     Labs:   Basic Metabolic Panel: Recent Labs  Lab 03/16/18 1152 03/16/18  1435 03/16/18 1945 03/17/18 0701 03/18/18 0354  NA 135  --   --  133* 133*  K 3.3*  --   --  3.2* 3.6  CL 96*  --   --  95* 98*  CO2 28  --   --  27 26  GLUCOSE 307*  --   --  165* 297*  BUN 11  --   --  16 17  CREATININE 1.72*  --   --  1.80* 1.85*  CALCIUM 8.7*  --   --  8.3* 8.4*  MG  --  1.6* 2.1  --   --   PHOS  --   --  2.5  --   --    GFR Estimated  Creatinine Clearance: 46.4 mL/min (A) (by C-G formula based on SCr of 1.85 mg/dL (H)). Liver Function Tests: Recent Labs  Lab 03/17/18 0701  AST 63*  ALT 59  ALKPHOS 71  BILITOT 1.2  PROT 7.1  ALBUMIN 2.8*   No results for input(s): LIPASE, AMYLASE in the last 168 hours. No results for input(s): AMMONIA in the last 168 hours. Coagulation profile No results for input(s): INR, PROTIME in the last 168 hours.  CBC: Recent Labs  Lab 03/16/18 1152 03/17/18 0701 03/18/18 0354  WBC 5.0 4.9 5.1  HGB 14.3 13.5 13.4  HCT 43.3 39.4 39.2  MCV 92.9 91.2 92.0  PLT 190 161 163   Cardiac Enzymes: Recent Labs  Lab 03/16/18 1945 03/16/18 2150 03/17/18 0701  CKTOTAL 183  --   --   TROPONINI 0.10* 0.11* 0.11*   BNP: Invalid input(s): POCBNP CBG: Recent Labs  Lab 03/16/18 2127 03/17/18 0811 03/17/18 1230 03/17/18 1656 03/17/18 2109  GLUCAP 323* 173* 258* 211* 203*   D-Dimer No results for input(s): DDIMER in the last 72 hours. Hgb A1c Recent Labs    03/16/18 1905  HGBA1C 11.8*   Lipid Profile Recent Labs    03/16/18 1759  CHOL 201*  HDL 59  LDLCALC 125*  TRIG 83  CHOLHDL 3.4   Thyroid function studies No results for input(s): TSH, T4TOTAL, T3FREE, THYROIDAB in the last 72 hours.  Invalid input(s): FREET3 Anemia work up No results for input(s): VITAMINB12, FOLATE, FERRITIN, TIBC, IRON, RETICCTPCT in the last 72 hours. Microbiology Recent Results (from the past 240 hour(s))  MRSA PCR Screening     Status: None   Collection Time: 03/17/18  7:03 AM  Result Value Ref Range Status   MRSA by PCR NEGATIVE NEGATIVE Final    Comment:        The GeneXpert MRSA Assay (FDA approved for NASAL specimens only), is one component of a comprehensive MRSA colonization surveillance program. It is not intended to diagnose MRSA infection nor to guide or monitor treatment for MRSA infections. Performed at [facility], [place] [address], [place], [place] [address]       Discharge Instructions:   Discharge Instructions    Diet - low sodium heart healthy   Complete by:  As directed    Diet Carb Modified   Complete by:  As directed    Discharge instructions   Complete by:  As directed    Cocaine cessation-- can cause spasms of the blood vessels in your heart and increased blood pressure   Increase activity slowly   Complete by:  As directed      Allergies as of 03/18/2018      Reactions   Lisinopril Anaphylaxis, Other (See Comments)   Throat  swells   Pamelor [nortriptyline Hcl] Anaphylaxis, Swelling   Throat swells   Angiotensin Receptor Blockers Other (See Comments)   (Angioedema with lisinopril, therefore ARB's are contraindicated)      Medication List    TAKE these medications   amitriptyline 75 MG tablet Commonly known as:  ELAVIL Take 1 tablet (75 mg total) by mouth at bedtime.   amLODipine 10 MG tablet Commonly known as:  NORVASC Take 1 tablet (10 mg total) by mouth daily.   aspirin 325 MG tablet Take 1 tablet (325 mg total) by mouth daily.   atorvastatin 40 MG tablet Commonly known as:  LIPITOR Take 1 tablet (40 mg total) by mouth daily. What changed:  when to take this   carvedilol 12.5 MG tablet Commonly known as:  COREG Take 1 tablet (12.5 mg total) by mouth 2 (two) times daily with a meal.   cloNIDine 0.1 MG tablet Commonly known as:  CATAPRES Take 1 tablet (0.1 mg total) by mouth 2 (two) times daily.   diclofenac sodium 1 % Gel Commonly known as:  VOLTAREN Apply 4 g topically 4 (four) times daily. What changed:    when to take this  reasons to take this   furosemide 20 MG tablet Commonly known as:  LASIX Take 1 tablet (20 mg total) by mouth daily.   gabapentin 300 MG capsule Commonly known as:  NEURONTIN Take 2 capsules (600 mg total) by mouth 2 (two) times daily.   Insulin Glargine 100 UNIT/ML Solostar Pen Commonly known as:  LANTUS SOLOSTAR Inject 60 Units into the skin daily at 10 pm. What  changed:  when to take this   omeprazole 20 MG capsule Commonly known as:  PRILOSEC Take 1 capsule (20 mg total) by mouth daily. What changed:    when to take this  reasons to take this   PEN NEEDLES 31GX5/16" 31G X 8 MM Misc Use as directed      Follow-up Information    [facility] Go on 03/23/2018.   Why:  at 10:00am for an appointment with Dr Denita Lung information: [address] [place] [address] [phone]           Time coordinating discharge: 35 min  Signed:  Geradine Girt   [facility] 03/18/2018, 8:02 AM

## 2018-03-19 ENCOUNTER — Telehealth: Payer: Self-pay

## 2018-03-19 NOTE — Telephone Encounter (Signed)
Transitional Care Clinic Post-discharge Follow-Up Phone Call:  Date of Discharge: 03/19/2018 Principal Discharge Diagnosis(es): chest pain Post-discharge Communication: (Clearly document all attempts clearly and date contact made) call placed to # [phone] and a HIPAA compliant voicemail message was left requesting a call back to  # [phone] and ask for Brad [name] or Brad [name] Call Completed: no

## 2018-03-22 ENCOUNTER — Telehealth: Payer: Self-pay

## 2018-03-22 NOTE — Telephone Encounter (Signed)
Transitional Care Clinic Post-discharge Follow-Up Phone Call:  Date of Discharge: 03/18/2018 Principal Discharge Diagnosis(es):  Chest pain,  Post-discharge Communication: call received from the patient.  He was at the [facility] with Don Perking, RN at the [facility] and was returning this CM call. Baldo Ash stated that he noticed that this CM had called him but his phone is not working.  Call Completed: Yes                   With Whom: Patient Interpreter Needed: no    Please check all that apply:  X  Patient is knowledgeable of his/her condition(s) and/or treatment. X  Patient is caring for self at the shelter. He is currently residing at the [facility],  ? Patient is receiving assist at home from family and/or caregiver. Family and/or caregiver is knowledgeable of patient's condition(s) and/or treatment. ? Patient is receiving home health services. If so, name of agency.     Medication Reconciliation:  ? Medication list reviewed with patient. X  Patient obtained all discharge medications. If not, why? - he stated that he has all of his medications and is " doing alright."  He was at the [facility] at the time of this call.   Activities of Daily Living:  X  Independent - has a cane to use when needed ? Needs assist (describe; ? home DME used) ? Total [facility] (describe, ? home DME used)   Community resources in place for patient:  X  IRC   ? Home Health/Home DME ? Assisted Living ? Support Group        Questions/Concerns discussed: he did not have any questions at this time. He informed this CM that his attorney told him that social security made a favorable decision about his disability but did not have any more information than that. He receives his mail at [facility] but did not have a letter yet from social security explaining his benefit. He is assuming that he has been approved for disability. He stated that he is going to call social security to confirm his approval.  He also confirmed his  appointment at [facility] tomorrow  - 03/23/18 @ 1000. Informed him that [facility] will provide a bus pass to the clinic and [facility] to provide bus pass back to the [employer].

## 2018-03-22 NOTE — Telephone Encounter (Signed)
Transitional Care Clinic Post-discharge Follow-Up Phone Call:  Date of Discharge: 03/19/2018 Principal Discharge Diagnosis(es): chest pain, cocaine abuse Post-discharge Communication: (Clearly document all attempts clearly and date contact made) call placed to # [phone] and the call dropped after 7 rings, unable to leave a message.  Call Completed: no  Call placed to [facility] [place]. She confirmed that the patient has been at the [employer] this weekend. Informed her that the patient has an appointment at [facility] tomorrow - 03/23/18 @ 1000. She stated that she would provide him with a bus pass to get to the clinic and [facility] will provide him with a bus pass to return to [facility].

## 2018-03-23 ENCOUNTER — Ambulatory Visit: Payer: Medicaid Other | Attending: Family Medicine | Admitting: Family Medicine

## 2018-03-23 ENCOUNTER — Encounter: Payer: Self-pay | Admitting: Pediatric Intensive Care

## 2018-03-23 ENCOUNTER — Encounter: Payer: Self-pay | Admitting: Family Medicine

## 2018-03-23 VITALS — BP 147/97 | HR 86 | Temp 98.4°F | Ht 71.0 in | Wt 199.4 lb

## 2018-03-23 DIAGNOSIS — E1121 Type 2 diabetes mellitus with diabetic nephropathy: Secondary | ICD-10-CM | POA: Diagnosis not present

## 2018-03-23 DIAGNOSIS — Z8619 Personal history of other infectious and parasitic diseases: Secondary | ICD-10-CM | POA: Insufficient documentation

## 2018-03-23 DIAGNOSIS — I10 Essential (primary) hypertension: Secondary | ICD-10-CM

## 2018-03-23 DIAGNOSIS — Z888 Allergy status to other drugs, medicaments and biological substances status: Secondary | ICD-10-CM | POA: Diagnosis not present

## 2018-03-23 DIAGNOSIS — E1122 Type 2 diabetes mellitus with diabetic chronic kidney disease: Secondary | ICD-10-CM | POA: Diagnosis not present

## 2018-03-23 DIAGNOSIS — Z8546 Personal history of malignant neoplasm of prostate: Secondary | ICD-10-CM | POA: Insufficient documentation

## 2018-03-23 DIAGNOSIS — I214 Non-ST elevation (NSTEMI) myocardial infarction: Secondary | ICD-10-CM | POA: Diagnosis not present

## 2018-03-23 DIAGNOSIS — K7460 Unspecified cirrhosis of liver: Secondary | ICD-10-CM | POA: Insufficient documentation

## 2018-03-23 DIAGNOSIS — M25571 Pain in right ankle and joints of right foot: Secondary | ICD-10-CM | POA: Diagnosis not present

## 2018-03-23 DIAGNOSIS — F1410 Cocaine abuse, uncomplicated: Secondary | ICD-10-CM | POA: Insufficient documentation

## 2018-03-23 DIAGNOSIS — Z96652 Presence of left artificial knee joint: Secondary | ICD-10-CM | POA: Insufficient documentation

## 2018-03-23 DIAGNOSIS — K219 Gastro-esophageal reflux disease without esophagitis: Secondary | ICD-10-CM | POA: Diagnosis not present

## 2018-03-23 DIAGNOSIS — N183 Chronic kidney disease, stage 3 unspecified: Secondary | ICD-10-CM

## 2018-03-23 DIAGNOSIS — I5032 Chronic diastolic (congestive) heart failure: Secondary | ICD-10-CM | POA: Diagnosis not present

## 2018-03-23 DIAGNOSIS — G8929 Other chronic pain: Secondary | ICD-10-CM | POA: Insufficient documentation

## 2018-03-23 DIAGNOSIS — I4891 Unspecified atrial fibrillation: Secondary | ICD-10-CM | POA: Insufficient documentation

## 2018-03-23 DIAGNOSIS — I130 Hypertensive heart and chronic kidney disease with heart failure and stage 1 through stage 4 chronic kidney disease, or unspecified chronic kidney disease: Secondary | ICD-10-CM | POA: Insufficient documentation

## 2018-03-23 DIAGNOSIS — IMO0002 Reserved for concepts with insufficient information to code with codable children: Secondary | ICD-10-CM

## 2018-03-23 DIAGNOSIS — E7800 Pure hypercholesterolemia, unspecified: Secondary | ICD-10-CM | POA: Diagnosis not present

## 2018-03-23 DIAGNOSIS — R682 Dry mouth, unspecified: Secondary | ICD-10-CM | POA: Diagnosis not present

## 2018-03-23 DIAGNOSIS — E1165 Type 2 diabetes mellitus with hyperglycemia: Secondary | ICD-10-CM

## 2018-03-23 DIAGNOSIS — K7030 Alcoholic cirrhosis of liver without ascites: Secondary | ICD-10-CM | POA: Diagnosis not present

## 2018-03-23 DIAGNOSIS — E1140 Type 2 diabetes mellitus with diabetic neuropathy, unspecified: Secondary | ICD-10-CM | POA: Insufficient documentation

## 2018-03-23 DIAGNOSIS — N400 Enlarged prostate without lower urinary tract symptoms: Secondary | ICD-10-CM | POA: Diagnosis present

## 2018-03-23 DIAGNOSIS — Z794 Long term (current) use of insulin: Secondary | ICD-10-CM | POA: Diagnosis not present

## 2018-03-23 DIAGNOSIS — F329 Major depressive disorder, single episode, unspecified: Secondary | ICD-10-CM | POA: Insufficient documentation

## 2018-03-23 DIAGNOSIS — Z79899 Other long term (current) drug therapy: Secondary | ICD-10-CM | POA: Insufficient documentation

## 2018-03-23 LAB — GLUCOSE, POCT (MANUAL RESULT ENTRY): POC Glucose: 325 mg/dl — AB (ref 70–99)

## 2018-03-23 MED ORDER — BIOTENE DRY MOUTH GENTLE MT LIQD: 5.0000 mL | Freq: Three times a day (TID) | OROMUCOSAL | 3 refills | Status: DC

## 2018-03-23 MED ORDER — INSULIN GLARGINE 100 UNIT/ML SOLOSTAR PEN: 35.0000 [IU] | PEN_INJECTOR | Freq: Two times a day (BID) | SUBCUTANEOUS | 5 refills | Status: DC

## 2018-03-23 MED ORDER — DICLOFENAC SODIUM 1 % TD GEL: 4.0000 g | Freq: Four times a day (QID) | TRANSDERMAL | 3 refills | Status: DC | PRN

## 2018-03-23 MED ORDER — AMLODIPINE BESYLATE 10 MG PO TABS: 10.0000 mg | ORAL_TABLET | Freq: Every day | ORAL | 5 refills | Status: DC

## 2018-03-23 MED ORDER — DULOXETINE HCL 60 MG PO CPEP: 60.0000 mg | ORAL_CAPSULE | Freq: Every day | ORAL | 3 refills | Status: DC
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
## 2018-03-24 LAB — PSA, TOTAL AND FREE
PSA, Free Pct: 30.7 %
PSA, Free: 0.43 ng/mL
Prostate Specific Ag, Serum: 1.4 ng/mL (ref 0.0–4.0)
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
## 2018-03-26 ENCOUNTER — Telehealth: Payer: Self-pay

## 2018-03-26 NOTE — Telephone Encounter (Signed)
Patient was called and informed of lab results. 

## 2018-03-31 ENCOUNTER — Telehealth: Payer: Self-pay | Admitting: Family Medicine

## 2018-03-31 NOTE — Telephone Encounter (Signed)
[facility] called and wants a call back

## 2018-04-01 NOTE — Telephone Encounter (Signed)
Returned the call  to [facility] #[phone] and spoke with Earlie Server. Dorothy wanted to confirm with [place] that patient was being compliant and getting help he needed since patient name came up on their computer system for care management. Earlie Server has also tried to reach patient on different occasions and hasn't been able to reach him.   Both [name] (Tourist information centre manager) and myself informed Earlie Server that patient is being compliant with his appointments and is seeing [name] [facility] Acupuncturist) at the [facility] for his bp, medication and sugar levels. Dorothy understood and stated that she will close patient's file.

## 2018-04-05 ENCOUNTER — Encounter: Payer: Self-pay | Admitting: Pediatric Intensive Care
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
## 2018-04-14 ENCOUNTER — Other Ambulatory Visit: Payer: Self-pay | Admitting: Family Medicine

## 2018-04-14 DIAGNOSIS — E1140 Type 2 diabetes mellitus with diabetic neuropathy, unspecified: Secondary | ICD-10-CM

## 2018-04-14 DIAGNOSIS — I10 Essential (primary) hypertension: Secondary | ICD-10-CM
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
## 2018-04-19 ENCOUNTER — Telehealth: Payer: Self-pay

## 2018-04-19 NOTE — Telephone Encounter (Signed)
Call received from the patient stating that he called social security and was told that he has been approved for disability. He said that he will be getting his first payment on 04/21/18 and will be receiving $1800/month.  He noted that social security told him that they will be sending him a letter with the details of the disability payments including his back pay. He noted that he already has the debit card for the monthly payments. He said that he will be speaking to Bethany at [employer] about housing options. He also said that he continues to work at the [employer] but only worked one day last week,

## 2018-04-20 ENCOUNTER — Encounter: Payer: Self-pay | Admitting: Pediatric Intensive Care

## 2018-04-27 ENCOUNTER — Ambulatory Visit: Payer: Medicaid Other | Admitting: Family Medicine
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
## 2018-04-30 ENCOUNTER — Encounter (HOSPITAL_COMMUNITY): Payer: Self-pay | Admitting: Emergency Medicine

## 2018-04-30 ENCOUNTER — Other Ambulatory Visit: Payer: Self-pay

## 2018-04-30 ENCOUNTER — Emergency Department (HOSPITAL_COMMUNITY): Payer: Medicaid Other

## 2018-04-30 ENCOUNTER — Observation Stay (HOSPITAL_COMMUNITY)
Admission: EM | Admit: 2018-04-30 | Discharge: 2018-05-01 | Disposition: A | Discharge status: Home / Self Care | Payer: Medicaid Other | Attending: Internal Medicine | Admitting: Internal Medicine

## 2018-04-30 DIAGNOSIS — Z8781 Personal history of (healed) traumatic fracture: Secondary | ICD-10-CM

## 2018-04-30 DIAGNOSIS — E118 Type 2 diabetes mellitus with unspecified complications: Secondary | ICD-10-CM

## 2018-04-30 DIAGNOSIS — K219 Gastro-esophageal reflux disease without esophagitis: Secondary | ICD-10-CM | POA: Diagnosis not present

## 2018-04-30 DIAGNOSIS — F17200 Nicotine dependence, unspecified, uncomplicated: Secondary | ICD-10-CM | POA: Diagnosis not present

## 2018-04-30 DIAGNOSIS — M89361 Hypertrophy of bone, right tibia: Secondary | ICD-10-CM | POA: Insufficient documentation

## 2018-04-30 DIAGNOSIS — M25471 Effusion, right ankle: Secondary | ICD-10-CM | POA: Diagnosis not present

## 2018-04-30 DIAGNOSIS — E1122 Type 2 diabetes mellitus with diabetic chronic kidney disease: Secondary | ICD-10-CM | POA: Insufficient documentation

## 2018-04-30 DIAGNOSIS — Z794 Long term (current) use of insulin: Secondary | ICD-10-CM | POA: Insufficient documentation

## 2018-04-30 DIAGNOSIS — E119 Type 2 diabetes mellitus without complications: Secondary | ICD-10-CM

## 2018-04-30 DIAGNOSIS — I4891 Unspecified atrial fibrillation: Secondary | ICD-10-CM | POA: Insufficient documentation

## 2018-04-30 DIAGNOSIS — I252 Old myocardial infarction: Secondary | ICD-10-CM | POA: Insufficient documentation

## 2018-04-30 DIAGNOSIS — K7460 Unspecified cirrhosis of liver: Secondary | ICD-10-CM | POA: Diagnosis not present

## 2018-04-30 DIAGNOSIS — Z967 Presence of other bone and tendon implants: Secondary | ICD-10-CM | POA: Diagnosis not present

## 2018-04-30 DIAGNOSIS — I2510 Atherosclerotic heart disease of native coronary artery without angina pectoris: Secondary | ICD-10-CM | POA: Diagnosis not present

## 2018-04-30 DIAGNOSIS — I10 Essential (primary) hypertension: Secondary | ICD-10-CM | POA: Diagnosis not present

## 2018-04-30 DIAGNOSIS — F1911 Other psychoactive substance abuse, in remission: Secondary | ICD-10-CM | POA: Diagnosis not present

## 2018-04-30 DIAGNOSIS — F329 Major depressive disorder, single episode, unspecified: Secondary | ICD-10-CM | POA: Diagnosis not present

## 2018-04-30 DIAGNOSIS — Z87892 Personal history of anaphylaxis: Secondary | ICD-10-CM | POA: Insufficient documentation

## 2018-04-30 DIAGNOSIS — I5042 Chronic combined systolic (congestive) and diastolic (congestive) heart failure: Secondary | ICD-10-CM | POA: Diagnosis not present

## 2018-04-30 DIAGNOSIS — K7030 Alcoholic cirrhosis of liver without ascites: Secondary | ICD-10-CM | POA: Diagnosis not present

## 2018-04-30 DIAGNOSIS — Z79899 Other long term (current) drug therapy: Secondary | ICD-10-CM | POA: Insufficient documentation

## 2018-04-30 DIAGNOSIS — F1410 Cocaine abuse, uncomplicated: Secondary | ICD-10-CM | POA: Diagnosis present

## 2018-04-30 DIAGNOSIS — I130 Hypertensive heart and chronic kidney disease with heart failure and stage 1 through stage 4 chronic kidney disease, or unspecified chronic kidney disease: Secondary | ICD-10-CM | POA: Diagnosis not present

## 2018-04-30 DIAGNOSIS — I5032 Chronic diastolic (congestive) heart failure: Secondary | ICD-10-CM | POA: Diagnosis present

## 2018-04-30 DIAGNOSIS — Z9889 Other specified postprocedural states: Secondary | ICD-10-CM

## 2018-04-30 DIAGNOSIS — M868X7 Other osteomyelitis, ankle and foot: Secondary | ICD-10-CM

## 2018-04-30 DIAGNOSIS — IMO0002 Reserved for concepts with insufficient information to code with codable children: Secondary | ICD-10-CM

## 2018-04-30 DIAGNOSIS — B182 Chronic viral hepatitis C: Secondary | ICD-10-CM | POA: Insufficient documentation

## 2018-04-30 DIAGNOSIS — E1165 Type 2 diabetes mellitus with hyperglycemia: Secondary | ICD-10-CM | POA: Diagnosis not present

## 2018-04-30 DIAGNOSIS — E7800 Pure hypercholesterolemia, unspecified: Secondary | ICD-10-CM | POA: Diagnosis not present

## 2018-04-30 DIAGNOSIS — E876 Hypokalemia: Secondary | ICD-10-CM | POA: Diagnosis not present

## 2018-04-30 DIAGNOSIS — N183 Chronic kidney disease, stage 3 unspecified: Secondary | ICD-10-CM | POA: Diagnosis present

## 2018-04-30 DIAGNOSIS — E1140 Type 2 diabetes mellitus with diabetic neuropathy, unspecified: Secondary | ICD-10-CM | POA: Insufficient documentation

## 2018-04-30 DIAGNOSIS — E1121 Type 2 diabetes mellitus with diabetic nephropathy: Secondary | ICD-10-CM | POA: Insufficient documentation

## 2018-04-30 DIAGNOSIS — Z888 Allergy status to other drugs, medicaments and biological substances status: Secondary | ICD-10-CM | POA: Insufficient documentation

## 2018-04-30 DIAGNOSIS — E1169 Type 2 diabetes mellitus with other specified complication: Secondary | ICD-10-CM | POA: Diagnosis not present

## 2018-04-30 LAB — BASIC METABOLIC PANEL
Anion gap: 15 (ref 5–15)
BUN: 23 mg/dL — ABNORMAL HIGH (ref 6–20)
CO2: 21 mmol/L — ABNORMAL LOW (ref 22–32)
Calcium: 8.6 mg/dL — ABNORMAL LOW (ref 8.9–10.3)
Chloride: 90 mmol/L — ABNORMAL LOW (ref 101–111)
Creatinine, Ser: 2.53 mg/dL — ABNORMAL HIGH (ref 0.61–1.24)
GFR calc Af Amer: 31 mL/min — ABNORMAL LOW (ref >60)
GFR calc non Af Amer: 26 mL/min — ABNORMAL LOW (ref >60)
Glucose, Bld: 264 mg/dL — ABNORMAL HIGH (ref 65–99)
Potassium: 3.2 mmol/L — ABNORMAL LOW (ref 3.5–5.1)
Sodium: 126 mmol/L — ABNORMAL LOW (ref 135–145)

## 2018-04-30 LAB — CBC WITH DIFFERENTIAL/PLATELET
Basophils Absolute: 0 10*3/uL (ref 0.0–0.1)
Basophils Relative: 0 %
Eosinophils Absolute: 0.1 10*3/uL (ref 0.0–0.7)
Eosinophils Relative: 2 %
HCT: 36.5 % — ABNORMAL LOW (ref 39.0–52.0)
Hemoglobin: 12.8 g/dL — ABNORMAL LOW (ref 13.0–17.0)
Lymphocytes Relative: 51 %
Lymphs Abs: 3.5 10*3/uL (ref 0.7–4.0)
MCH: 32.3 pg (ref 26.0–34.0)
MCHC: 35.1 g/dL (ref 30.0–36.0)
MCV: 92.2 fL (ref 78.0–100.0)
Monocytes Absolute: 0.5 10*3/uL (ref 0.1–1.0)
Monocytes Relative: 7 %
Neutro Abs: 2.7 10*3/uL (ref 1.7–7.7)
Neutrophils Relative %: 40 %
Platelets: 184 10*3/uL (ref 150–400)
RBC: 3.96 MIL/uL — ABNORMAL LOW (ref 4.22–5.81)
RDW: 12.2 % (ref 11.5–15.5)
WBC: 6.8 10*3/uL (ref 4.0–10.5)

## 2018-04-30 LAB — I-STAT CG4 LACTIC ACID, ED: Lactic Acid, Venous: 2.24 mmol/L (ref 0.5–1.9)

## 2018-04-30 MED ORDER — POTASSIUM CHLORIDE CRYS ER 20 MEQ PO TBCR: 30.0000 meq | EXTENDED_RELEASE_TABLET | Freq: Once | ORAL | Status: DC

## 2018-04-30 MED ORDER — SODIUM CHLORIDE 0.9 % IV BOLUS
1000.0000 mL | Freq: Once | INTRAVENOUS | Status: DC
  Administered 2018-04-30: 1000 mL via INTRAVENOUS

## 2018-04-30 MED ORDER — SODIUM CHLORIDE 0.9 % IV BOLUS
1000.0000 mL | Freq: Once | INTRAVENOUS | Status: AC
  Administered 2018-04-30: 1000 mL via INTRAVENOUS

## 2018-04-30 MED ORDER — PIPERACILLIN-TAZOBACTAM 3.375 G IVPB 30 MIN
3.3750 g | Freq: Once | INTRAVENOUS | Status: AC
  Administered 2018-04-30: 3.375 g via INTRAVENOUS
  Filled 2018-04-30: qty 50

## 2018-04-30 MED ORDER — SODIUM CHLORIDE 0.9 % IV SOLN
INTRAVENOUS | Status: DC
  Administered 2018-05-01: 02:00:00 via INTRAVENOUS

## 2018-04-30 MED ORDER — INSULIN ASPART 100 UNIT/ML ~~LOC~~ SOLN
0.0000 [IU] | Freq: Three times a day (TID) | SUBCUTANEOUS | Status: DC
  Administered 2018-05-01: 7 [IU] via SUBCUTANEOUS

## 2018-04-30 MED ORDER — VANCOMYCIN HCL IN DEXTROSE 1-5 GM/200ML-% IV SOLN
1000.0000 mg | Freq: Once | INTRAVENOUS | Status: DC
  Administered 2018-04-30: 1000 mg via INTRAVENOUS
  Filled 2018-04-30: qty 200
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
## 2018-04-30 NOTE — ED Triage Notes (Signed)
Patient brought in by [place]. Patient twisted right ankle and has a hx of previous surgeries. Patient does have alcohol on board. [facility]

## 2018-05-01 ENCOUNTER — Encounter (HOSPITAL_COMMUNITY): Payer: Self-pay | Admitting: *Deleted

## 2018-05-01 ENCOUNTER — Observation Stay (HOSPITAL_COMMUNITY): Payer: Medicaid Other

## 2018-05-01 DIAGNOSIS — M869 Osteomyelitis, unspecified: Secondary | ICD-10-CM | POA: Insufficient documentation

## 2018-05-01 DIAGNOSIS — N183 Chronic kidney disease, stage 3 (moderate): Secondary | ICD-10-CM

## 2018-05-01 DIAGNOSIS — I10 Essential (primary) hypertension: Secondary | ICD-10-CM

## 2018-05-01 DIAGNOSIS — M25471 Effusion, right ankle: Secondary | ICD-10-CM | POA: Diagnosis not present

## 2018-05-01 DIAGNOSIS — Z8781 Personal history of (healed) traumatic fracture: Secondary | ICD-10-CM

## 2018-05-01 DIAGNOSIS — I5032 Chronic diastolic (congestive) heart failure: Secondary | ICD-10-CM

## 2018-05-01 DIAGNOSIS — Z967 Presence of other bone and tendon implants: Secondary | ICD-10-CM

## 2018-05-01 DIAGNOSIS — I2510 Atherosclerotic heart disease of native coronary artery without angina pectoris: Secondary | ICD-10-CM

## 2018-05-01 LAB — BASIC METABOLIC PANEL
Anion gap: 15 (ref 5–15)
BUN: 21 mg/dL — ABNORMAL HIGH (ref 6–20)
CO2: 23 mmol/L (ref 22–32)
Calcium: 8.7 mg/dL — ABNORMAL LOW (ref 8.9–10.3)
Chloride: 95 mmol/L — ABNORMAL LOW (ref 101–111)
Creatinine, Ser: 2.22 mg/dL — ABNORMAL HIGH (ref 0.61–1.24)
GFR calc Af Amer: 36 mL/min — ABNORMAL LOW (ref >60)
GFR calc non Af Amer: 31 mL/min — ABNORMAL LOW (ref >60)
Glucose, Bld: 320 mg/dL — ABNORMAL HIGH (ref 65–99)
Potassium: 3.1 mmol/L — ABNORMAL LOW (ref 3.5–5.1)
Sodium: 133 mmol/L — ABNORMAL LOW (ref 135–145)

## 2018-05-01 LAB — RAPID URINE DRUG SCREEN, HOSP PERFORMED
Amphetamines: NOT DETECTED
Barbiturates: NOT DETECTED
Benzodiazepines: NOT DETECTED
Cocaine: POSITIVE — AB
Opiates: NOT DETECTED
Tetrahydrocannabinol: NOT DETECTED

## 2018-05-01 LAB — CBC
HCT: 38.9 % — ABNORMAL LOW (ref 39.0–52.0)
Hemoglobin: 13.6 g/dL (ref 13.0–17.0)
MCH: 31.9 pg (ref 26.0–34.0)
MCHC: 35 g/dL (ref 30.0–36.0)
MCV: 91.3 fL (ref 78.0–100.0)
Platelets: 173 10*3/uL (ref 150–400)
RBC: 4.26 MIL/uL (ref 4.22–5.81)
RDW: 12.3 % (ref 11.5–15.5)
WBC: 5.2 10*3/uL (ref 4.0–10.5)

## 2018-05-01 LAB — GLUCOSE, CAPILLARY: Glucose-Capillary: 319 mg/dL — ABNORMAL HIGH (ref 65–99)

## 2018-05-01 LAB — SEDIMENTATION RATE: Sed Rate: 2 mm/hr (ref 0–16)

## 2018-05-01 MED ORDER — POTASSIUM CHLORIDE CRYS ER 20 MEQ PO TBCR
40.0000 meq | EXTENDED_RELEASE_TABLET | Freq: Once | ORAL | Status: AC
  Administered 2018-05-01: 40 meq via ORAL
  Filled 2018-05-01: qty 2

## 2018-05-01 NOTE — Discharge Summary (Signed)
Physician Discharge Summary  Brad [name] EVO:[id number] DOB: 02-20-1961 DOA: 04/30/2018  PCP: Charlott Rakes, MD  Admit date: 04/30/2018 Discharge date: 05/01/2018  Admitted From: Home Disposition:  Home  Recommendations for Outpatient Follow-up:  1. Follow up with PCP in 1 week 2. Follow up with Dr. Erlinda Hong of orthopedic surgery in 1 week. Will need short-term follow up CT or MRI to assure resolution of soft tissue swelling and stability of subchondral cystic change.  3. Please obtain BMP in 1 week to repeat K. Hypokalemia replaced prior to discharge.  4. Follow up final blood culture result.   Discharge Condition: Stable CODE STATUS: Full  Diet recommendation: Carb modified   Brief/Interim Summary: From H&P by Dr. Shanon Brow: Brad [name] is a 57 y.o. male with medical history significant of insulin-dependent dependent diabetes, chronic diastolic congestive heart failure, history of cocaine abuse, history of ankle fracture with ORIF to right ankle 2 years ago comes in with swelling to the right ankle.  Patient reports that since his surgery he has had chronic waxing and waning swelling of his ankle since.  He denies adamantly any IV drug abuse.  He denies the use of any needles whatsoever.  He denies any rashes or sores to his skin.  He denies any recent pneumonia or urinary tract infection.  He denies any skin infections.  Patient had a x-ray done of his right ankle because of the swelling and it showed some questionable area of osteomyelitis.  Patient is referred for admission for osteomyelitis in the ED and is been ordered to be placed on vancomycin and Zosyn.  Patient has no fevers and is  normal white count.  He has no rashes anywhere to suggest cellulitis and he has no history thereof either.  Interim: Patient underwent CT right ankle which did not show evidence of osteomyelitis. It revealed callus formation, ankle joint effusion, likely from post-traumatic etiology, subchondral cystic  change, likely secondary to chondromalacia. Sed rate was only 2, patient did not have leukocytosis of fevers. Results were reviewed with patient and patient recommended to follow up with Dr. Erlinda Hong for further recommendation and for repeat scan in the future.   Discharge Diagnoses:  Principal Problem:   Ankle swelling, right Active Problems:   History of drug abuse   Chronic diastolic congestive heart failure (HCC)   Chronic hepatitis C with cirrhosis (HCC)   Essential hypertension   Uncontrolled diabetes mellitus with diabetic nephropathy, with long-term current use of insulin (HCC)   Hypokalemia   CKD (chronic kidney disease) stage 3, GFR 30-59 ml/min (HCC)   Cocaine abuse (HCC)   CAD (coronary artery disease), native coronary artery   S/P ORIF (open reduction internal fixation) fracture   Cirrhosis ([place])   Discharge Instructions  Discharge Instructions    Call MD for:  difficulty breathing, headache or visual disturbances   Complete by:  As directed    Call MD for:  extreme fatigue   Complete by:  As directed    Call MD for:  hives   Complete by:  As directed    Call MD for:  persistant dizziness or light-headedness   Complete by:  As directed    Call MD for:  persistant nausea and vomiting   Complete by:  As directed    Call MD for:  severe uncontrolled pain   Complete by:  As directed    Call MD for:  temperature >100.4   Complete by:  As directed    Diet Carb Modified  Complete by:  As directed    Discharge instructions   Complete by:  As directed    You were cared for by a hospitalist during your hospital stay. If you have any questions about your discharge medications or the care you received while you were in the hospital after you are discharged, you can call the unit and ask to speak with the hospitalist on call if the hospitalist that took care of you is not available. Once you are discharged, your primary care physician will handle any further medical issues. Please  note that NO REFILLS for any discharge medications will be authorized once you are discharged, as it is imperative that you return to your primary care physician (or establish a relationship with a primary care physician if you do not have one) for your aftercare needs so that they can reassess your need for medications and monitor your lab values.   Increase activity slowly   Complete by:  As directed      Allergies as of 05/01/2018      Reactions   Lisinopril Anaphylaxis, Other (See Comments)   Throat swells   Pamelor [nortriptyline Hcl] Anaphylaxis, Swelling   Throat swells   Angiotensin Receptor Blockers Other (See Comments)   (Angioedema with lisinopril, therefore ARB's are contraindicated)      Medication List    TAKE these medications   amitriptyline 75 MG tablet Commonly known as:  ELAVIL Take 1 tablet (75 mg total) by mouth at bedtime.   amLODipine 10 MG tablet Commonly known as:  NORVASC Take 1 tablet (10 mg total) by mouth daily.   aspirin 325 MG tablet Take 1 tablet (325 mg total) by mouth daily.   atorvastatin 40 MG tablet Commonly known as:  LIPITOR Take 1 tablet (40 mg total) by mouth daily.   BIOTENE DRY MOUTH GENTLE Liqd Use as directed 5 mLs in the mouth or throat 3 (three) times daily.   carvedilol 12.5 MG tablet Commonly known as:  COREG Take 1 tablet (12.5 mg total) by mouth 2 (two) times daily with a meal.   cloNIDine 0.1 MG tablet Commonly known as:  CATAPRES Take 1 tablet (0.1 mg total) by mouth 2 (two) times daily.   diclofenac sodium 1 % Gel Commonly known as:  VOLTAREN Apply 4 g topically 4 (four) times daily as needed (to painful sites).   DULoxetine 60 MG capsule Commonly known as:  CYMBALTA Take 1 capsule (60 mg total) by mouth daily.   furosemide 20 MG tablet Commonly known as:  LASIX Take 1 tablet (20 mg total) by mouth daily.   gabapentin 300 MG capsule Commonly known as:  NEURONTIN Take 2 capsules (600 mg total) by mouth 2  (two) times daily.   Insulin Glargine 100 UNIT/ML Solostar Pen Commonly known as:  LANTUS SOLOSTAR Inject 35 Units into the skin 2 (two) times daily.   omeprazole 20 MG capsule Commonly known as:  PRILOSEC Take 1 capsule (20 mg total) by mouth daily.   PEN NEEDLES 31GX5/16" 31G X 8 MM Misc Use as directed      Follow-up Information    Charlott Rakes, MD. Schedule an appointment as soon as possible for a visit in 1 week(s).   Specialty:  Family Medicine Contact information: [place] [address] [phone]        Leandrew Koyanagi, MD. Schedule an appointment as soon as possible for a visit in 1 week(s).   Specialty:  Orthopedic Surgery Contact  information: [place] [address] [phone]          Allergies  Allergen Reactions  . Lisinopril Anaphylaxis and Other (See Comments)    Throat swells  . Pamelor [Nortriptyline Hcl] Anaphylaxis and Swelling    Throat swells  . Angiotensin Receptor Blockers Other (See Comments)    (Angioedema with lisinopril, therefore ARB's are contraindicated)    Consultations:  None    Procedures/Studies: Dg Ankle Complete Right  Result Date: 04/30/2018 CLINICAL DATA:  Worsening pain and swelling to the right ankle. EXAM: RIGHT ANKLE - COMPLETE 3+ VIEW COMPARISON:  10/06/2016 FINDINGS: Prior side plate and screw fixation of distal fibula. Prior cancellous screw fixation of the medial malleolus. Subchondral area of osteolysis with thick sclerotic rim seen within the tibial plafond laterally. Similarly area of osteolysis with surrounding sclerotic changes seen at the prior fracture line within the distal fibula. Reactive periosteal reaction along the distal tibia. Ankle mortise is disrupted. Diffuse soft tissue swelling about the ankle. IMPRESSION: Findings suspicious for osteomyelitis of the right distal tibia, and distal fibula. Disruption of ankle mortise. Diffuse soft tissue swelling  about the right ankle. These results were called by telephone at the time of interpretation on 04/30/2018 at 9:23 pm to Dr. Lacretia Leigh , who verbally acknowledged these results. Electronically Signed   By: Fidela Salisbury M.D.   On: 04/30/2018 21:25   Ct Ankle Right Wo Contrast  Result Date: 05/01/2018 CLINICAL DATA:  Right ankle twisting injury. History of prior ankle surgery August, 2017. EXAM: CT OF THE RIGHT ANKLE WITHOUT CONTRAST TECHNIQUE: Multidetector CT imaging of the right ankle was performed according to the standard protocol. Multiplanar CT image reconstructions were also generated. COMPARISON:  Plain radiographs of the right ankle from 04/30/2018 and 10/06/2016 FINDINGS: Bones/Joint/Cartilage Intact lateral plate and screw fixation across the distal fibular diaphysis and lateral malleolus with 2 cortical screws traversing the medial malleolus. A single screw is also noted in the distal diaphysis of the fibula oriented in the anterior to posterior direction. Exuberant, dense bridging callus is noted along the posterior aspect of the fibula and tibia with near complete osseous union of the distal fibular diaphyseal fracture. No findings to suggest an acute fracture. Lucencies of the distal fibula seen radiographically by likely related to clefts or incomplete osseous union from callus and heterotopic bone. Subchondral cystic change on both sides of the ankle joint more so laterally are identified of the tibial plafond and lateral talar dome. In the absence of significant joint effusion, findings are felt to represent advanced degenerative change secondary to full-thickness cartilaginous loss and less likely from a septic arthritis given the eccentricity of this finding. No conclusive evidence for acute osteomyelitis. No evidence of hardware failure nor loosening. Calcaneal enthesopathy is seen along plantar dorsal aspect. Lesser degrees of osteoarthritic joint space narrowing is seen of the  subtalar midfoot articulations. Ligaments Suboptimally assessed by CT. Muscles and Tendons No significant muscle atrophy. No intramuscular hemorrhage. Intact Achilles tendon. Extensor tendons are unremarkable. The peroneal brevis and longus tendons are obscured by metallic artifacts from the patient's fibular hardware. What is visualized of the posteromedial tendons is unremarkable. Soft tissues Periarticular soft tissue swelling is seen about the malleoli presumed posttraumatic. IMPRESSION: 1. The patient is status post bimalleolar fixation. Exuberant callus formation is seen, progressed since prior radiographs from 10/06/2016 along the posterior aspect of the tibia as well as fibula. 2. No acute fracture lucency or malalignment is noted. 3. Small ankle joint  effusion likely posttraumatic in etiology, less likely from septic arthritis. 4. Subchondral cystic change across both sides of the ankle joint is noted, more so involving the lateral tibial plafond and and to a lesser degree the lateral talar dome. This is likely secondary to chondromalacia given joint space narrowing seen. No conclusive evidence for acute osteomyelitis. Would recommend short-term interval follow-up CT or MRI to assure resolution of soft tissue swelling and to assure the stability of the subchondral cystic change. Electronically Signed   By: Ashley Royalty M.D.   On: 05/01/2018 01:23      Discharge Exam: Vitals:   05/01/18 0230 05/01/18 0527  BP: (!) 157/96 (!) 153/99  Pulse: 88 84  Resp: 16 15  Temp: (!) 97.5 F (36.4 C) 97.7 F (36.5 C)  SpO2: 99% 98%    General: Pt is alert, awake, not in acute distress Cardiovascular: RRR, S1/S2 +, no rubs, no gallops Respiratory: CTA bilaterally, no wheezing, no rhonchi Abdominal: Soft, NT, ND, bowel sounds + Extremities: +Right ankle with joint effusion and warmth, per patient the swelling is about his baseline     The results of significant diagnostics from this hospitalization  (including imaging, microbiology, ancillary and laboratory) are listed below for reference.     Microbiology: No results found for this or any previous visit (from the past 240 hour(s)).   Labs: BNP (last 3 results) Recent Labs    08/05/17 1151 11/06/17 1245 03/16/18 1216  BNP 263.7* 335.5* 967.8*   Basic Metabolic Panel: Recent Labs  Lab 04/30/18 2232 05/01/18 0519  NA 126* 133*  K 3.2* 3.1*  CL 90* 95*  CO2 21* 23  GLUCOSE 264* 320*  BUN 23* 21*  CREATININE 2.53* 2.22*  CALCIUM 8.6* 8.7*   Liver Function Tests: No results for input(s): AST, ALT, ALKPHOS, BILITOT, PROT, ALBUMIN in the last 168 hours. No results for input(s): LIPASE, AMYLASE in the last 168 hours. No results for input(s): AMMONIA in the last 168 hours. CBC: Recent Labs  Lab 04/30/18 2232 05/01/18 0519  WBC 6.8 5.2  NEUTROABS 2.7  --   HGB 12.8* 13.6  HCT 36.5* 38.9*  MCV 92.2 91.3  PLT 184 173   Cardiac Enzymes: No results for input(s): CKTOTAL, CKMB, CKMBINDEX, TROPONINI in the last 168 hours. BNP: Invalid input(s): POCBNP CBG: Recent Labs  Lab 05/01/18 0800  GLUCAP 319*   D-Dimer No results for input(s): DDIMER in the last 72 hours. Hgb A1c No results for input(s): HGBA1C in the last 72 hours. Lipid Profile No results for input(s): CHOL, HDL, LDLCALC, TRIG, CHOLHDL, LDLDIRECT in the last 72 hours. Thyroid function studies No results for input(s): TSH, T4TOTAL, T3FREE, THYROIDAB in the last 72 hours.  Invalid input(s): FREET3 Anemia work up No results for input(s): VITAMINB12, FOLATE, FERRITIN, TIBC, IRON, RETICCTPCT in the last 72 hours. Urinalysis    Component Value Date/Time   COLORURINE YELLOW 11/15/2017 0338   APPEARANCEUR CLEAR 11/15/2017 0338   LABSPEC 1.006 11/15/2017 0338   PHURINE 5.0 11/15/2017 0338   GLUCOSEU 150 (A) 11/15/2017 0338   HGBUR SMALL (A) 11/15/2017 0338   BILIRUBINUR NEGATIVE 11/15/2017 0338   BILIRUBINUR neg 05/08/2017 1503   KETONESUR NEGATIVE  11/15/2017 0338   PROTEINUR 100 (A) 11/15/2017 0338   UROBILINOGEN 0.2 05/08/2017 1503   UROBILINOGEN 1.0 04/26/2012 1328   NITRITE NEGATIVE 11/15/2017 0338   LEUKOCYTESUR NEGATIVE 11/15/2017 0338   Sepsis Labs Invalid input(s): PROCALCITONIN,  WBC,  LACTICIDVEN Microbiology No results found for this or  any previous visit (from the past 240 hour(s)).   Patient was seen and examined on the day of discharge and was found to be in stable condition. Time coordinating discharge: 35 minutes including assessment and coordination of care, as well as examination of the patient.   SIGNED:  Dessa Phi, DO [facility] Pager [phone]  If 7PM-7AM, please contact night-coverage [email or web address] Password [facility] 05/01/2018, 8:23 AM
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
## 2018-05-01 NOTE — Progress Notes (Signed)
Discharged from floor via w/c for transport to [employer] by bus. Belongings with pt. No changes in assessment. Dasani Cre[employer], [employer]

## 2018-05-06 LAB — CULTURE, BLOOD (ROUTINE X 2)
Culture: NO GROWTH
Culture: NO GROWTH
Special Requests: ADEQUATE
Special Requests: ADEQUATE

## 2018-05-11 ENCOUNTER — Telehealth: Payer: Self-pay | Admitting: Family Medicine

## 2018-05-11 ENCOUNTER — Telehealth: Payer: Self-pay | Admitting: Pediatric Intensive Care

## 2018-05-11 NOTE — Telephone Encounter (Signed)
Returned call to client. He will pick up bus passes on 03-25-23 morning.

## 2018-05-11 NOTE — Telephone Encounter (Signed)
Patient called the office asking to speak with [name], case manager, to give her an update on her status and request a cane. Patient was recently in the hospital for swelling on his right ankle. Patient was instructed not to put much weight on it, therefore he is requesting a cane. Informed patient that I will inform both PCP and [name] of this. Also scheduled patient a f/u appointment on Wednesday, 5/29 @ 9:30am. Informed patient I would ask [name], congregational nurse, to give him bus passes for patient to come to his appointment. Patient understood.   During our conversation, patient informed me that his disability got approved and will start receiving payments on June 1. He also said that "they owe him payments and will receive it in 3 installments". Patient was happy with the news and is currently looking for housing. He hopeful that he'll find something despite his record. Patient also has a new phone and the new number is [phone].   Call placed to [name], [phone], and he informed her of patient's appointment and needing bus passes. [name] agreed to give patient buss passes but needed him to see her at her office this Friday.   Call placed to [name] #[phone], where patient was and informed him of my conversation with [name]. Patient understood. Also reminded patient of appointment day and time as well as the message that I would be sending PCP regarding his cane. Informed patient that we will contact him once we had an update.   Message sent to PCP.

## 2018-05-11 NOTE — Telephone Encounter (Signed)
Advised client to meet CN at [facility] clinic on Friday morning to obtain bus passes for upcoming appointment.

## 2018-05-13 ENCOUNTER — Telehealth: Payer: Self-pay | Admitting: Family Medicine

## 2018-05-13 NOTE — Telephone Encounter (Signed)
Faxed patient's prescription for cane to [facility] [phone].

## 2018-05-14 ENCOUNTER — Telehealth: Payer: Self-pay | Admitting: Family Medicine

## 2018-05-14 NOTE — Telephone Encounter (Signed)
Call placed to [facility] regarding patient's prescription for cane. Spoke with Brad [name] and he informed me that prescription was was received and cane was delivered.   Call placed to patient [phone] to inform him of cane delivery and confirm that the address on Epic is correct. Spoke with patient and patient confirmed that address is correct. Informed him of delivery. Patient was appreciative. Patient also informed me that [name], Scientist, research (physical sciences), gave him bus passes for his appointment on Wednesday 5/29 with provider.

## 2018-05-18 ENCOUNTER — Telehealth: Payer: Self-pay

## 2018-05-18 ENCOUNTER — Encounter: Payer: Self-pay | Admitting: Pediatric Intensive Care

## 2018-05-18 NOTE — Telephone Encounter (Addendum)
Call received from the patient stating that he has not yet received his cane. He confirmed that he is still staying at the [facility] and also confirmed the address of [facility] where the cane was sent.  He then explained that he has been working with his caseworker, Building services engineer, at [employer], to locate housing. He said that he has an application at [facility] and he has 30 more days that he can stay at [employer].  He then stated that he had to beg for money yesterday to have someone take him to his wife's funeral in [place]. He noted that she had been on dialysis for years and his 59 year old son is now staying with his aunt.  This CM stated noted that he had reported his wife dying about a year ago, but he said that was his aunt, or mother.   He also explained that his first disability check is scheduled to be deposited on 05/21/18 and he will be receiving $771/month.  He said that he is due about $11,000 in back back and they will be paid in 3 installments.  He confirmed his appointment at [facility] for tomorrow  - 05/19/18 @ 0930 and also confirmed that he received bus passes to get to the clinic.
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
## 2018-05-19 ENCOUNTER — Other Ambulatory Visit: Payer: Self-pay

## 2018-05-19 ENCOUNTER — Emergency Department (HOSPITAL_COMMUNITY): Payer: Medicaid Other

## 2018-05-19 ENCOUNTER — Ambulatory Visit: Payer: Medicaid Other | Admitting: Family Medicine

## 2018-05-19 ENCOUNTER — Encounter (HOSPITAL_COMMUNITY): Payer: Self-pay | Admitting: Emergency Medicine

## 2018-05-19 ENCOUNTER — Emergency Department (HOSPITAL_COMMUNITY)
Admission: EM | Admit: 2018-05-19 | Discharge: 2018-05-19 | Disposition: A | Discharge status: Home / Self Care | Payer: Medicaid Other | Attending: Emergency Medicine | Admitting: Emergency Medicine

## 2018-05-19 DIAGNOSIS — Z79899 Other long term (current) drug therapy: Secondary | ICD-10-CM | POA: Insufficient documentation

## 2018-05-19 DIAGNOSIS — Z7982 Long term (current) use of aspirin: Secondary | ICD-10-CM | POA: Insufficient documentation

## 2018-05-19 DIAGNOSIS — Y999 Unspecified external cause status: Secondary | ICD-10-CM | POA: Diagnosis not present

## 2018-05-19 DIAGNOSIS — I5032 Chronic diastolic (congestive) heart failure: Secondary | ICD-10-CM | POA: Insufficient documentation

## 2018-05-19 DIAGNOSIS — Z794 Long term (current) use of insulin: Secondary | ICD-10-CM | POA: Insufficient documentation

## 2018-05-19 DIAGNOSIS — W01198A Fall on same level from slipping, tripping and stumbling with subsequent striking against other object, initial encounter: Secondary | ICD-10-CM | POA: Insufficient documentation

## 2018-05-19 DIAGNOSIS — S0990XA Unspecified injury of head, initial encounter: Secondary | ICD-10-CM | POA: Diagnosis present

## 2018-05-19 DIAGNOSIS — Y929 Unspecified place or not applicable: Secondary | ICD-10-CM | POA: Diagnosis not present

## 2018-05-19 DIAGNOSIS — Y939 Activity, unspecified: Secondary | ICD-10-CM | POA: Diagnosis not present

## 2018-05-19 DIAGNOSIS — W19XXXA Unspecified fall, initial encounter: Secondary | ICD-10-CM

## 2018-05-19 DIAGNOSIS — N183 Chronic kidney disease, stage 3 (moderate): Secondary | ICD-10-CM | POA: Insufficient documentation

## 2018-05-19 DIAGNOSIS — E119 Type 2 diabetes mellitus without complications: Secondary | ICD-10-CM | POA: Diagnosis not present

## 2018-05-19 DIAGNOSIS — I130 Hypertensive heart and chronic kidney disease with heart failure and stage 1 through stage 4 chronic kidney disease, or unspecified chronic kidney disease: Secondary | ICD-10-CM | POA: Insufficient documentation

## 2018-05-19 MED ORDER — TRAMADOL HCL 50 MG PO TABS: 50.0000 mg | ORAL_TABLET | Freq: Three times a day (TID) | ORAL | 0 refills | Status: AC | PRN

## 2018-05-19 MED ORDER — HYDROCODONE-ACETAMINOPHEN 5-325 MG PO TABS
1.0000 | ORAL_TABLET | Freq: Once | ORAL | Status: AC
Start: 2018-05-19 — End: 2018-05-19
  Administered 2018-05-19: 1 via ORAL
  Filled 2018-05-19: qty 1
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
## 2018-05-19 NOTE — ED Notes (Signed)
Pt ambulated, Pt had steady gait and stated that he felt "good".

## 2018-05-19 NOTE — Discharge Instructions (Signed)
Nice to meet you Brad [name].  You suffered some bruising to your skull and brain and have a concussion as a result of your fall. You should try to rest and take it easy for the next day or two, and you may continue to have a headache. The handout has signs to look our for that may indicate you need to come back to the hospital. Otherwise, be sure to make an appointment with your primary care doctor.

## 2018-05-19 NOTE — Progress Notes (Signed)
Patient ID: Brad [name], male   DOB: 1961-07-09, 57 y.o.   MRN: [id number] Asked to review CT scan for evaluation of small petechial hemorrhage right occipital lobe. CT scan was done proximal and 12 hours off the patient came in the ER so his artery had a 12 are observation minimal bleeding no mass effect no follow-up needed no repeat scanning needed recommend discharge patient with head injury and concussion postconcussive syndrome sheet. Follow-up with medical doctor as needed
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
## 2018-05-24 ENCOUNTER — Ambulatory Visit: Payer: Self-pay | Admitting: Family Medicine

## 2018-05-31 ENCOUNTER — Telehealth: Payer: Self-pay

## 2018-05-31 NOTE — Telephone Encounter (Signed)
Message received patient requesting call back to # [phone] . Call returned and voicemail message left

## 2018-06-01 ENCOUNTER — Other Ambulatory Visit: Payer: Self-pay

## 2018-06-01 ENCOUNTER — Telehealth: Payer: Self-pay

## 2018-06-01 ENCOUNTER — Telehealth: Payer: Self-pay | Admitting: *Deleted

## 2018-06-01 DIAGNOSIS — E1140 Type 2 diabetes mellitus with diabetic neuropathy, unspecified: Secondary | ICD-10-CM

## 2018-06-01 NOTE — Telephone Encounter (Signed)
Attempted again to contact the patient.  Call placed to # [phone] and a HIPAA compliant voicemail message was left requesting a call back to # [phone].

## 2018-06-01 NOTE — Telephone Encounter (Signed)
Call placed to the patient.  Informed him that [facility] is sending a cane to his current address as they delivered the cane to an address they had for him on [address].  Also explained to him that Dr Margarita Rana was being notified that he was requesting a refill for gabapentin.  He went on to explain how he is trying to " do right" by providing financial assistance to his ex-wife and son by paying for their phone bills and brakes for her car.  He stated that he received his disability check for $771 this month and is due to receive his next check 06/21/2018. He said that he tried to contact his probation officer to inform her of his new address.    As per Doloris Hall, CMA,  She sent a message to Dr Margarita Rana regarding the need for a new order for gabapentin as another provider had ordered it initially.
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
## 2018-06-01 NOTE — Telephone Encounter (Signed)
Patient verified DOB Patient is requesting a follow up call regarding his cane.

## 2018-06-02 ENCOUNTER — Telehealth: Payer: Self-pay

## 2018-06-02 ENCOUNTER — Other Ambulatory Visit: Payer: Self-pay

## 2018-06-02 DIAGNOSIS — E1140 Type 2 diabetes mellitus with diabetic neuropathy, unspecified: Secondary | ICD-10-CM

## 2018-06-02 MED ORDER — GABAPENTIN 300 MG PO CAPS: 600.0000 mg | ORAL_CAPSULE | Freq: Two times a day (BID) | ORAL | 0 refills | Status: DC

## 2018-06-02 MED ORDER — GABAPENTIN 300 MG PO CAPS
600.0000 mg | ORAL_CAPSULE | Freq: Two times a day (BID) | ORAL | 0 refills | Status: DC
Start: 2018-06-02 — End: 2018-09-08

## 2018-06-02 NOTE — Telephone Encounter (Signed)
Call received from the patient earlier today inquiring about his gabapentin. He said that he went to the pharmacy and they did not have a prescription.  Informed him that this CM would check and get back to him.  He also noted that he has not yet received the cane.   As per Doloris Hall, CMA, the gabapentin was in written prescription. She then converted it to an electronic prescription that was sent to [facility].  This CM attempted x 2 to contact the patient this afternoon # [phone] (M) to inquire if he has received the cane yet and to inform him that the prescription was sent to [place]. Voicemail message left with CM call back # [phone].

## 2018-06-14 ENCOUNTER — Telehealth: Payer: Self-pay

## 2018-06-14 ENCOUNTER — Telehealth: Payer: Self-pay | Admitting: Family Medicine

## 2018-06-14 NOTE — Telephone Encounter (Signed)
Patient called and requested for [name] to return his call once she returns from the hospital. Please fu at your earliest convenience.
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
## 2018-06-21 NOTE — Telephone Encounter (Signed)
Noted  

## 2018-07-05 ENCOUNTER — Telehealth: Payer: Self-pay

## 2018-07-05 NOTE — Telephone Encounter (Signed)
Call received from the patient. He explained that he continues to have severe headaches with blurry vision and nausea since [place] [employer] [place] and hit his head at the end of May. He said that the headacehs are not getting better. Instructed him to go to the ED for evaluation but he said that he didn't want to go to the ED and wait for hours and he scheduled an appointment with Dr Margarita Rana for tomorrow - 07/06/18. Explained to him that if a scan is necessary, that cant be done at [facility] and he said again that he didn't want to go to ED.   He was also inquiring about a referral to urology. Informed him that a referral had been made. He stated that they may not have been able to reach him because he had to change his phone # many times.   He then inquired about his gabapentin and said that he was only given 30 pills and he is not sure why. He usually is given enough for a month. He is taking 2 capsules twice daily.  He was concerned that the wrong prescription was given to the pharmacist.  Informed him that this CM will contact [facility] and then notify Dr Margarita Rana  Call placed to [name] [facility] at [facility]. He stated that the gabapentin  prescription was written for 300 mg  - 2 capsules twice daily and the quantity was only 30 capsules, not 120 capsules. He would like to clarify the quantity.  He stated that he has been contacted by the patient.  Informed him that Dr Margarita Rana would be notified of the request to clarify.

## 2018-07-05 NOTE — Telephone Encounter (Signed)
I will address his concerns at his visit tomorrow

## 2018-07-06 ENCOUNTER — Ambulatory Visit: Payer: Medicaid Other | Admitting: Family Medicine

## 2018-07-10 IMAGING — CT CT HEAD W/O CM
4 series · 16 of 47 positions shown, 18 images · non-contrast
Comparison: Prior CT from 10/06/2016.

CLINICAL DATA: Initial evaluation for acute blurry vision,
weakness.

EXAM:
CT HEAD WITHOUT CONTRAST
TECHNIQUE: Contiguous axial images were obtained from the base of the skull
through the vertex without intravenous contrast.

[Series 3: head wo · axial · 0.45mm/px · z∈[+1364,+1489]mm · 7 of 35 slices shown, 9 images]
[im 5/35  brain]
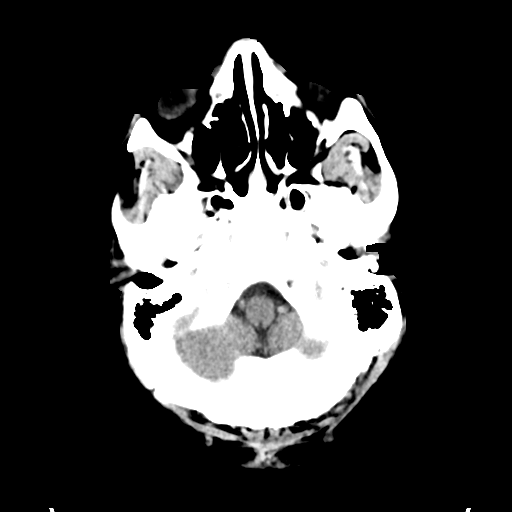
[im 5/35  bone]
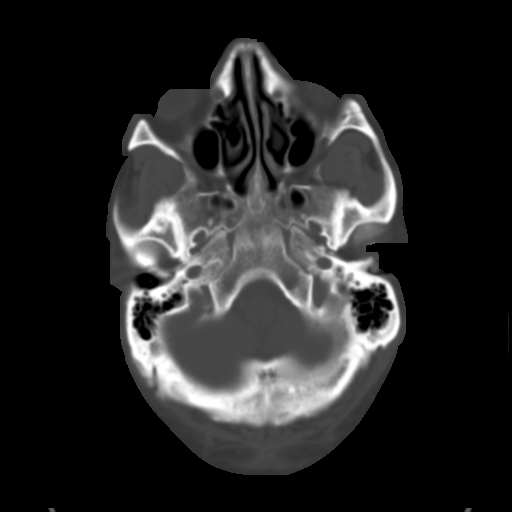
[im 9/35  brain]
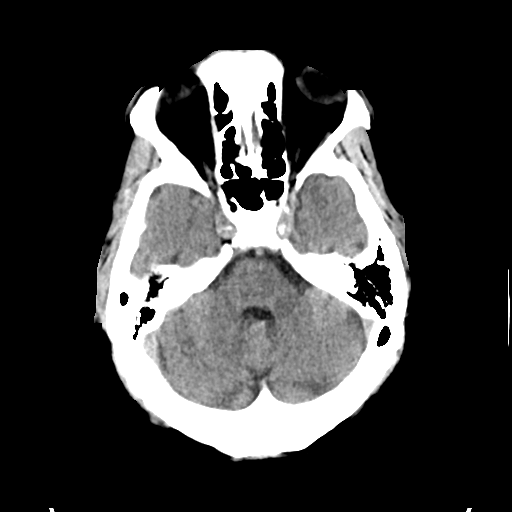
[im 13/35  brain]
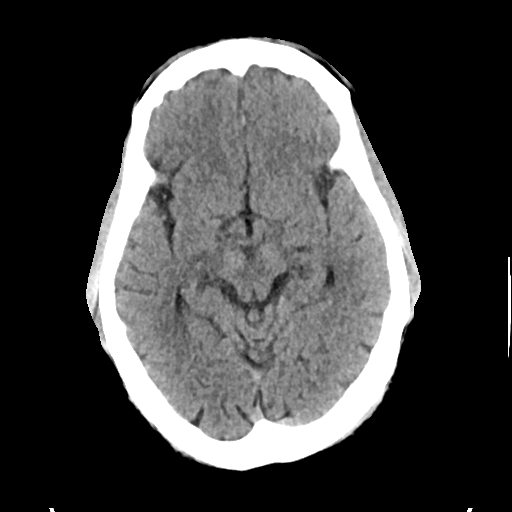
[im 18/35  brain]
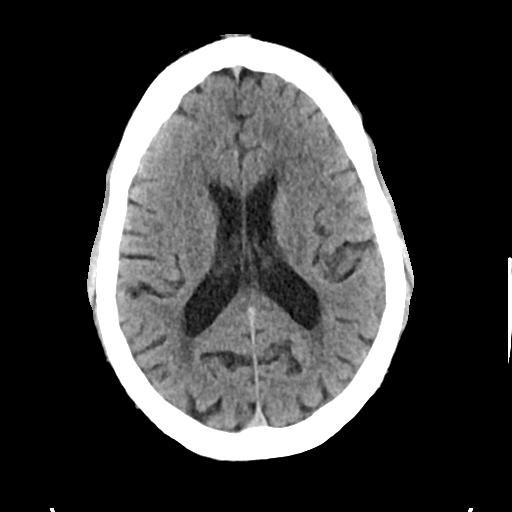
[im 22/35  brain]
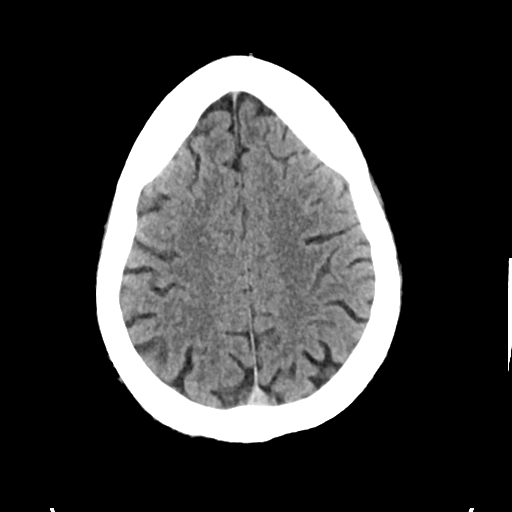
[im 22/35  bone]
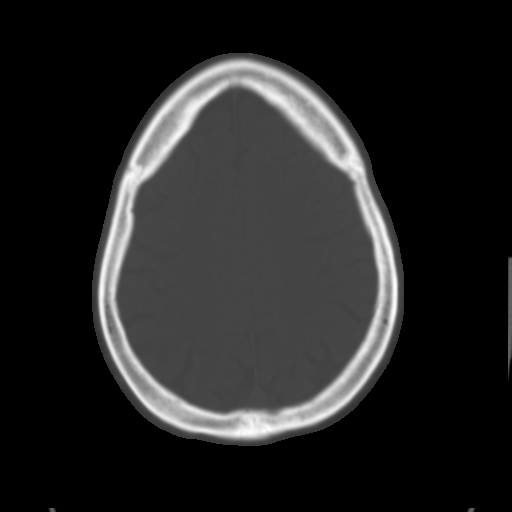
[im 26/35  brain]
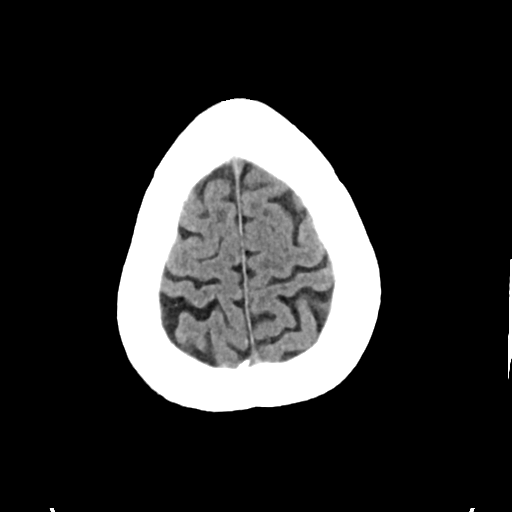
[im 30/35  brain]
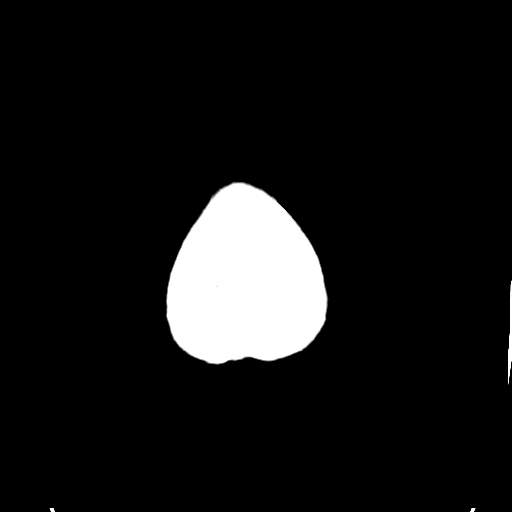

[Series 4: head bone · axial · 0.45mm/px · z∈[+1360,+1394]mm · 3 of 86 slices shown]
[im 9/86  bone]
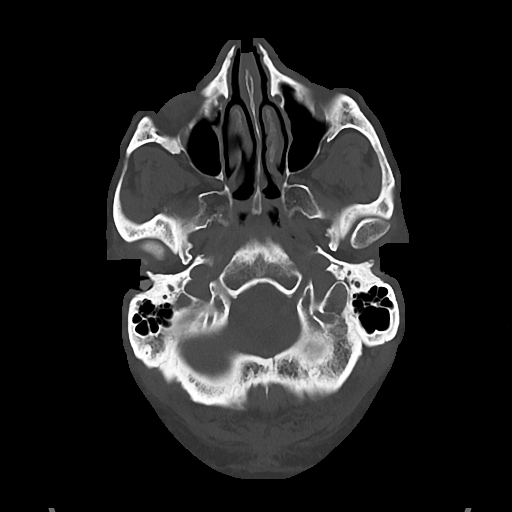
[im 18/86  bone]
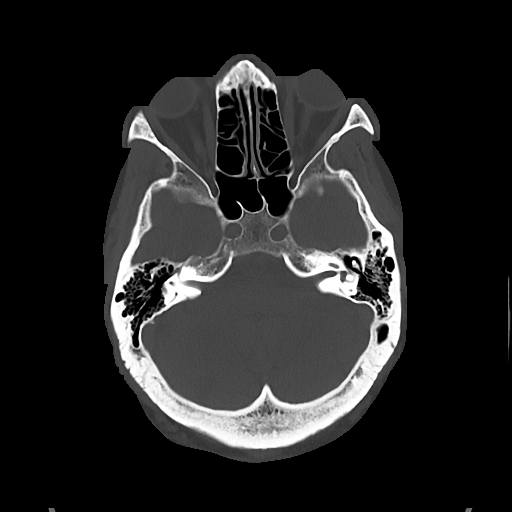
[im 26/86  bone]
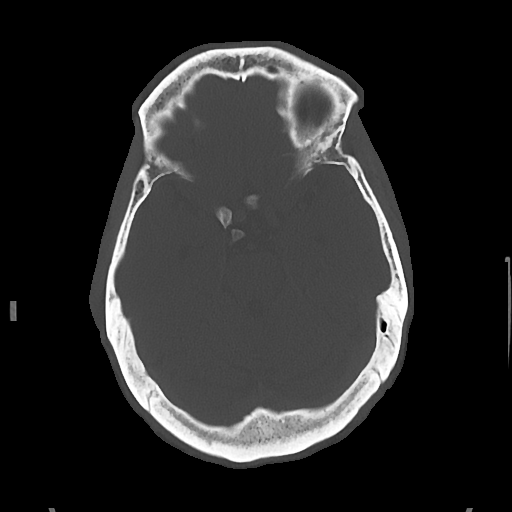

[Series 5: cor soft · coronal · 0.34mm/px · 3 of 73 slices shown]
[im 25/73  brain]
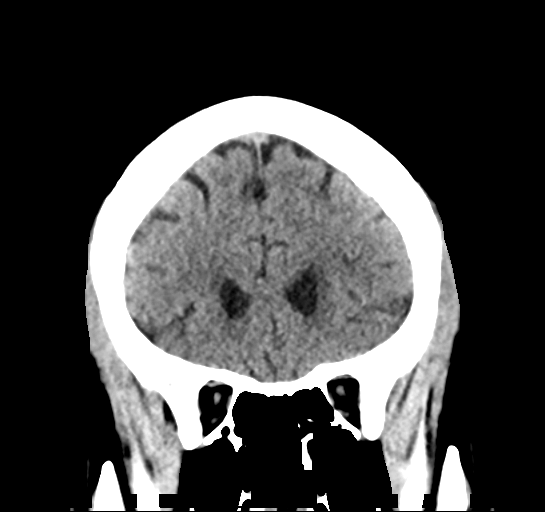
[im 33/73  brain]
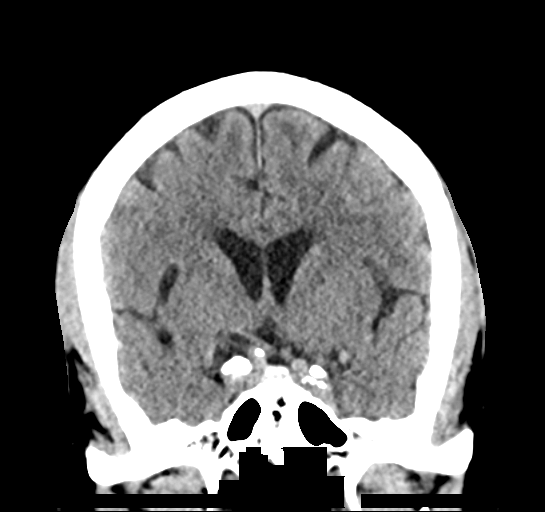
[im 41/73  brain]
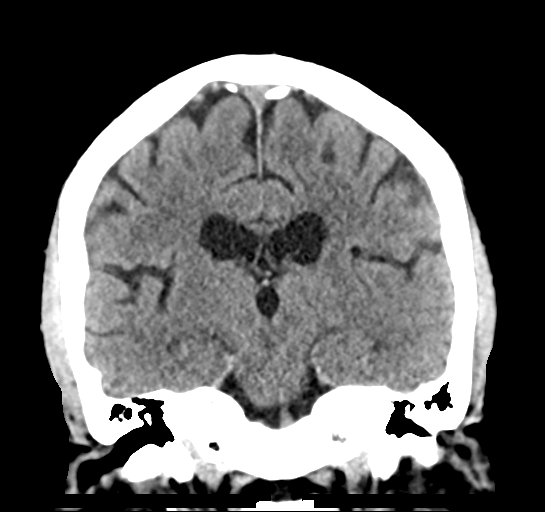

[Series 6: sag soft · sagittal · 0.34mm/px · 3 of 61 slices shown]
[im 21/61  brain]
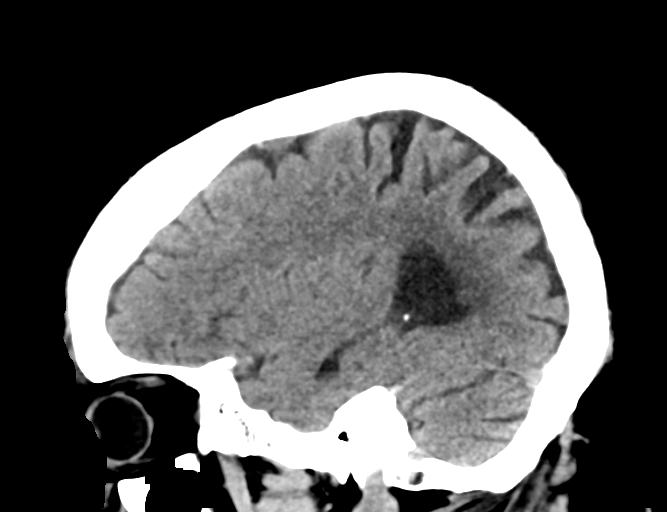
[im 31/61  brain]
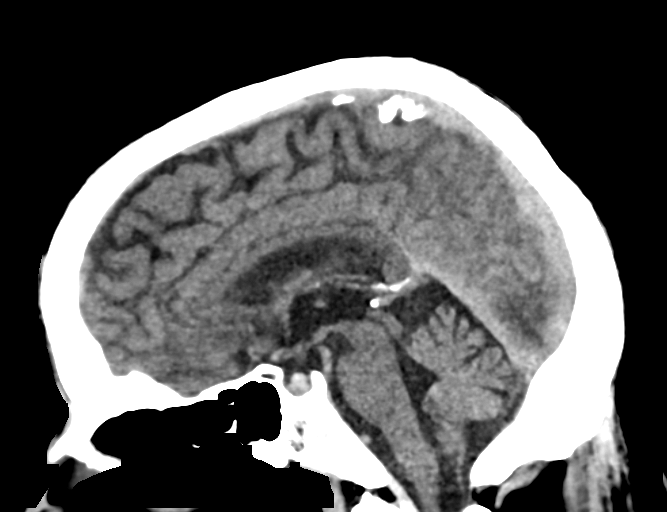
[im 41/61  brain]
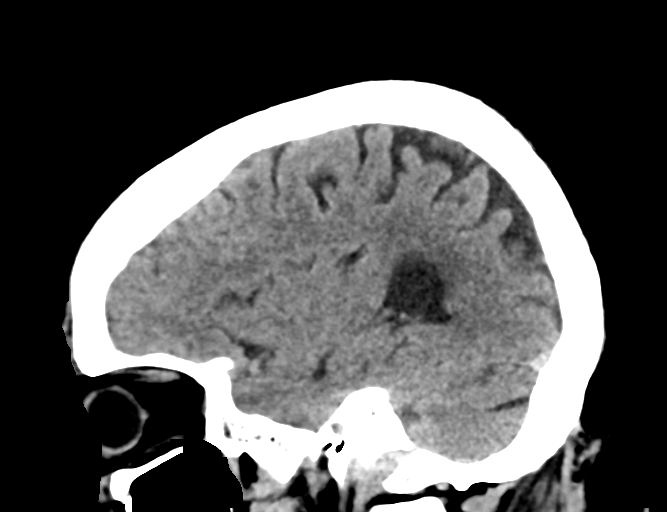

[16 of 47 positions shown; findings below may reference images not displayed]

FINDINGS: Brain: Mild age-related cerebral atrophy with chronic small vessel
ischemic disease. No acute intracranial hemorrhage. No evidence for
acute large vessel territory infarct. No mass lesion, midline shift
or mass effect. No hydrocephalus. No extra-axial fluid collection.

Vascular: No hyperdense vessel. Scattered vascular calcifications
noted within the carotid siphons.

Skull: Scalp soft tissues and calvarium within normal limits.

Sinuses/Orbits: Globes and orbital soft tissues within normal
limits. Paranasal sinuses and mastoid air cells are clear.
IMPRESSION: 1. No acute intracranial process.
2. Mild age-related cerebral atrophy with chronic small vessel
ischemic disease.
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
## 2018-07-12 IMAGING — MR MR HEAD W/O CM
9 of 10 series · 37 of 48 positions shown · non-contrast
Comparison: 07/09/2017 CT head.  09/18/2016 MRI head.

CLINICAL DATA: 55 y/o M; history of atrial fibrillation presenting
with chest pain and shortness of breath.

EXAM:
MRI HEAD WITHOUT CONTRAST
TECHNIQUE: Multiplanar, multiecho pulse sequences of the brain and surrounding
structures were obtained without intravenous contrast.

[Series 3: DWI · axial · 3.0mm · 0.94mm/px · z∈[-117,+13]mm · 8 of 90 slices shown (1 of 2)]
[im 1/90]
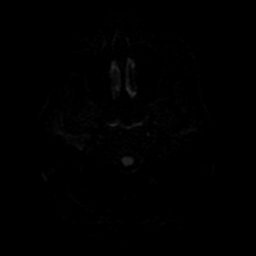
[im 10/90]
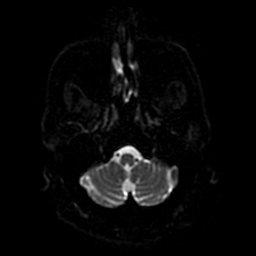
[im 30/90]
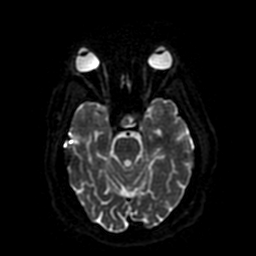
[im 40/90]
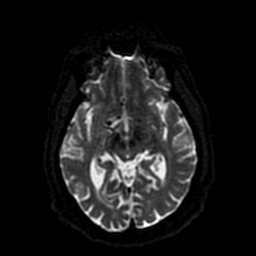
[im 50/90]
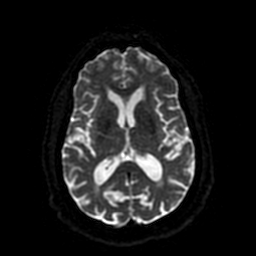
[im 60/90]
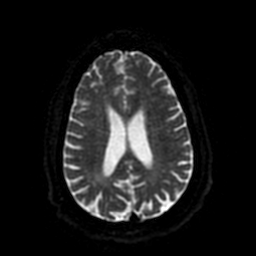
[im 80/90]
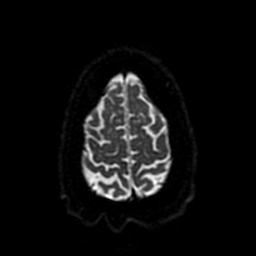
[im 90/90]
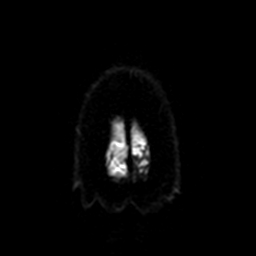

[Series 4: T2 · axial · 5.0mm · 0.47mm/px · z∈[-117,+13]mm · 2 of 23 slices shown (1 of 2)]
[im 1/23]
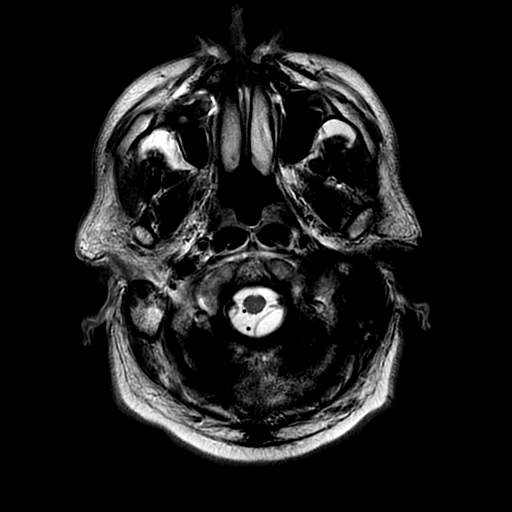
[im 23/23]
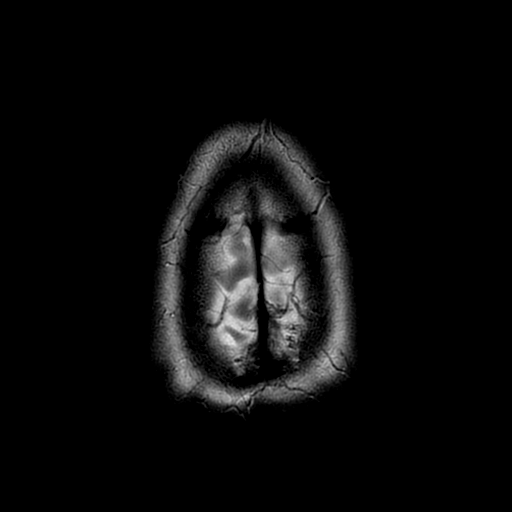

[Series 5: DWI · coronal · 4.0mm · 0.94mm/px · 7 of 72 slices shown (2 of 2)]
[im 1/72]
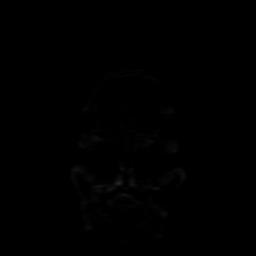
[im 12/72]
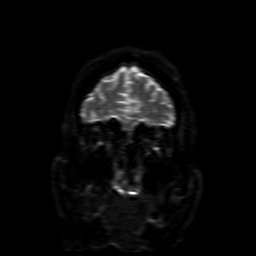
[im 24/72]
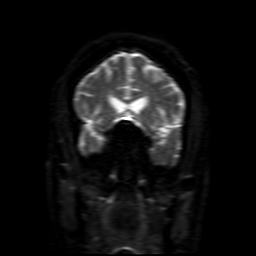
[im 36/72]
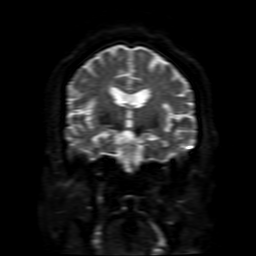
[im 48/72]
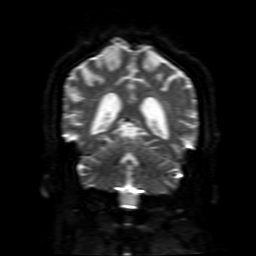
[im 60/72]
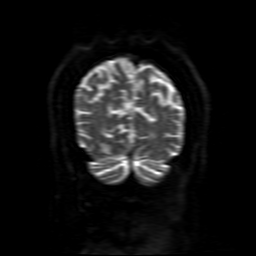
[im 72/72]
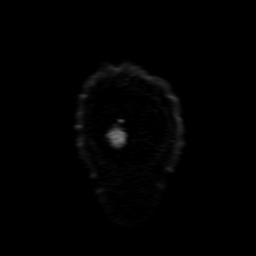

[Series 6: FLAIR · axial · 5.0mm · 0.94mm/px · z∈[-117,+13]mm · 2 of 23 slices shown (1 of 2)]
[im 1/23]
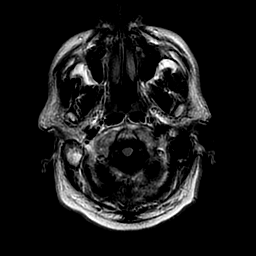
[im 23/23]
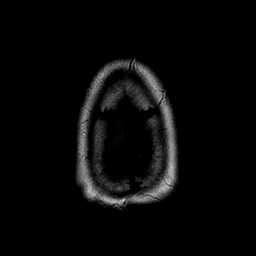

[Series 7: (person_name) · axial · 3.0mm · 0.47mm/px · z∈[-119,-36]mm · 5 of 92 slices shown]
[im 1/92]
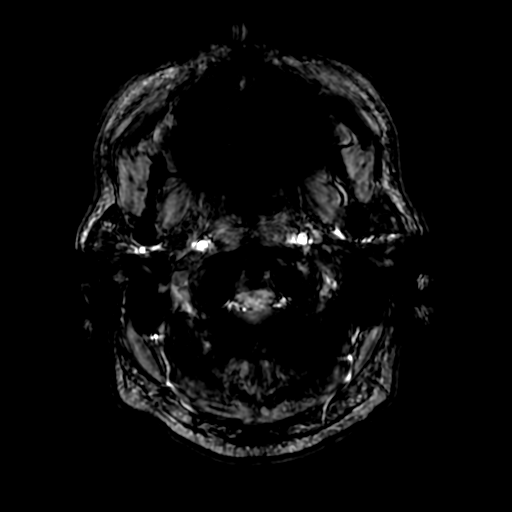
[im 12/92]
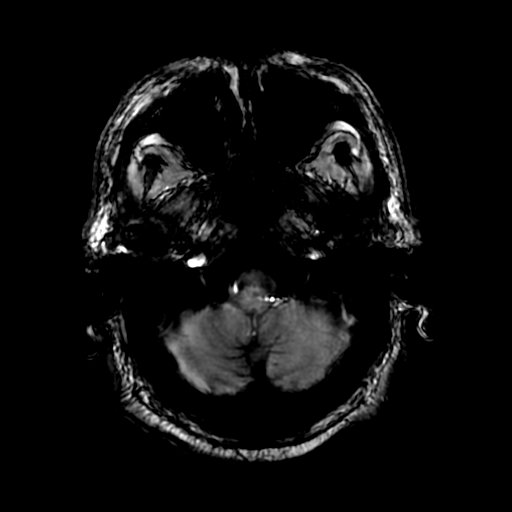
[im 23/92]
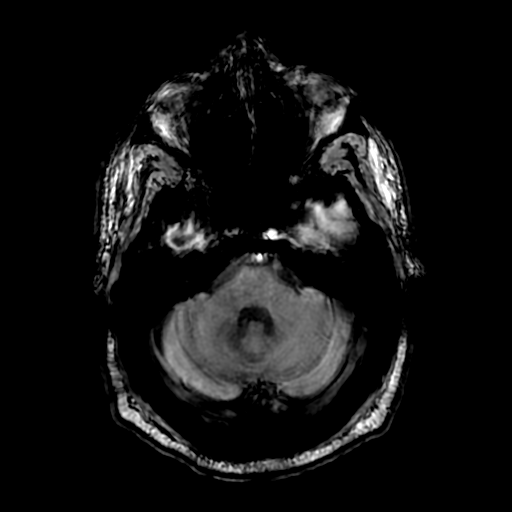
[im 35/92]
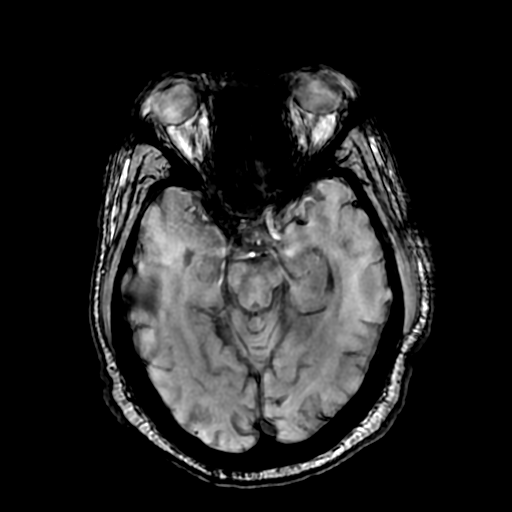
[im 57/92]
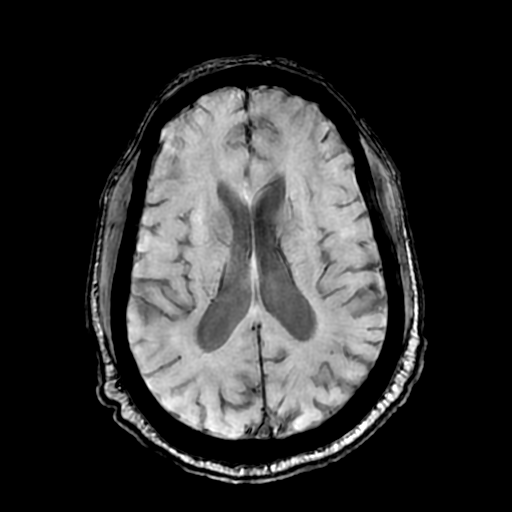

[Series 8: FLAIR · sagittal · 5.0mm · 0.47mm/px · 2 of 23 slices shown (2 of 2)]
[im 1/23]
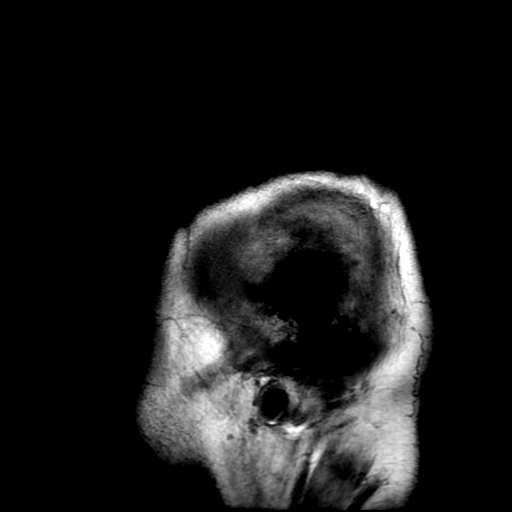
[im 23/23]
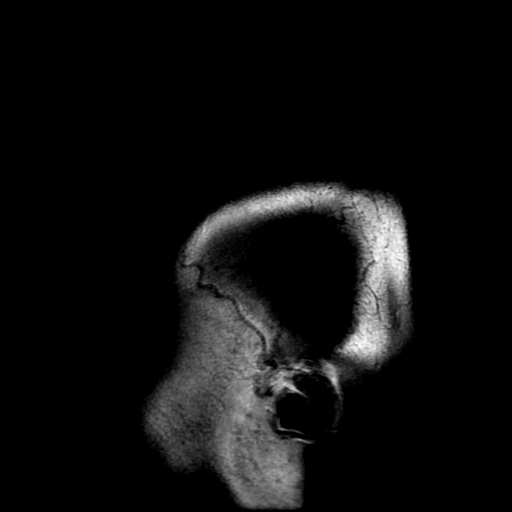

[Series 10: T2 · coronal · 5.0mm · 0.43mm/px · 3 of 30 slices shown (2 of 2)]
[im 1/30]
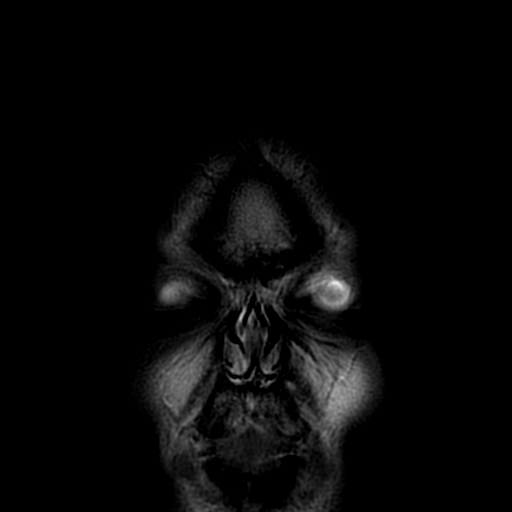
[im 15/30]
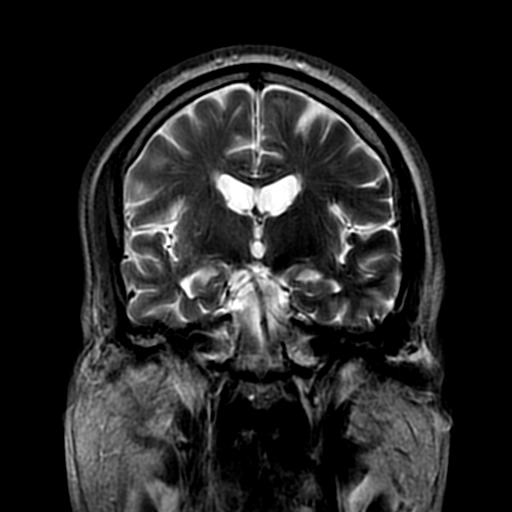
[im 30/30]
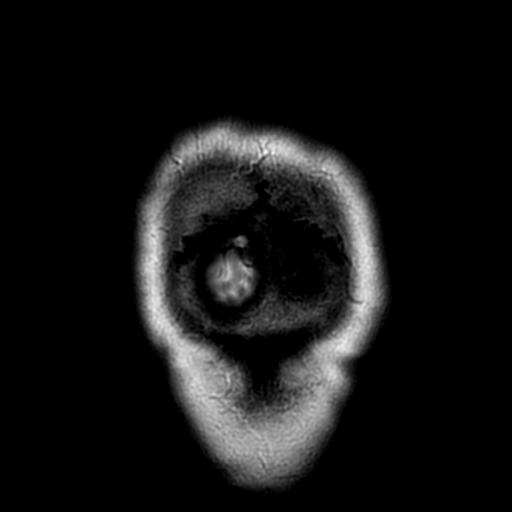

[Series 350: ADC · axial · 3.0mm · 0.94mm/px · z∈[-117,+13]mm · 4 of 45 slices shown (1 of 2)]
[im 1/45]
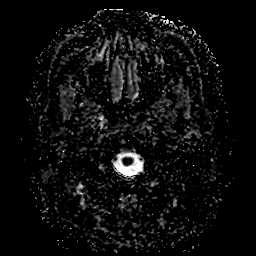
[im 15/45]
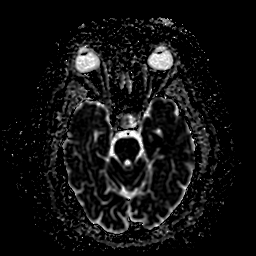
[im 30/45]
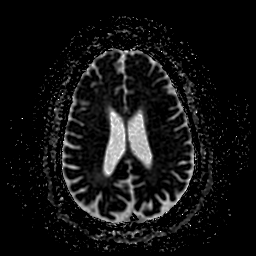
[im 45/45]
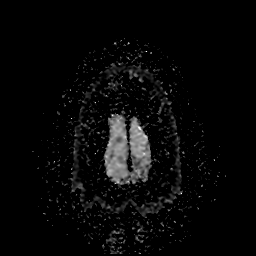

[Series 550: ADC · coronal · 4.0mm · 0.94mm/px · 4 of 36 slices shown (2 of 2)]
[im 1/36]
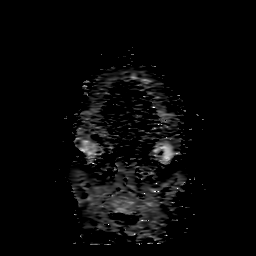
[im 12/36]
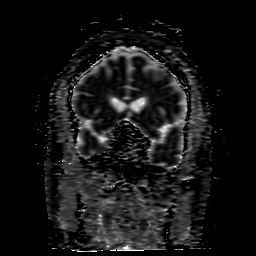
[im 24/36]
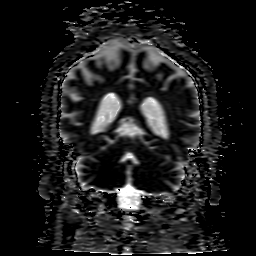
[im 36/36]
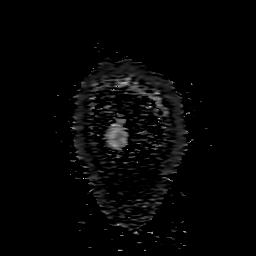

[37 of 48 positions shown; findings below may reference images not displayed]

FINDINGS: Brain: No acute infarction, hemorrhage, hydrocephalus, extra-axial
collection or mass lesion. Stable mild chronic microvascular
ischemic changes and mild parenchymal volume loss of the brain.

Vascular: Normal flow voids.

Skull and upper cervical spine: Normal marrow signal.

Sinuses/Orbits: Mild maxillary sinus mucosal thickening. No abnormal
signal of mastoid air cells. Orbits are unremarkable.

Other: None.
IMPRESSION: 1. No acute intracranial abnormality.
2. Stable mild chronic microvascular ischemic changes and mild
parenchymal volume loss of the brain.

By: Longlive Boehme M.D.

## 2018-07-12 IMAGING — CT CT HEAD W/O CM
4 series · 16 of 47 positions shown, 18 images · non-contrast
Comparison: Head CT 07/07/2017

CLINICAL DATA: Fall with head trauma.

EXAM:
CT HEAD WITHOUT CONTRAST
TECHNIQUE: Contiguous axial images were obtained from the base of the skull
through the vertex without intravenous contrast.

[Series 3: head without · axial · non-contrast · 0.45mm/px · z∈[-96,+24]mm · 7 of 32 slices shown, 9 images]
[im 4/32  brain]
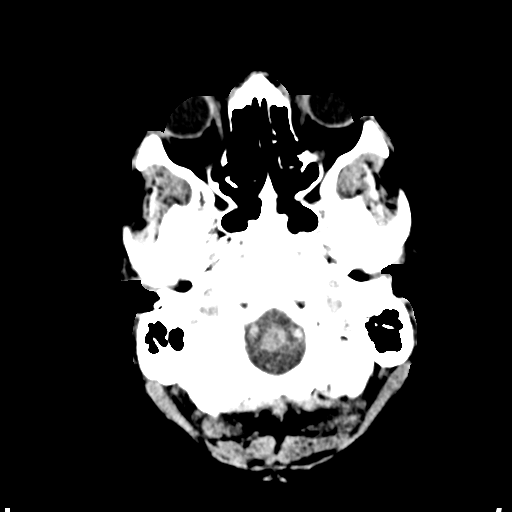
[im 4/32  bone]
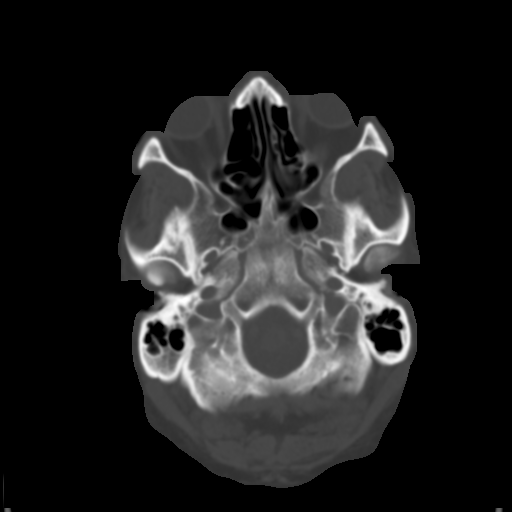
[im 8/32  brain]
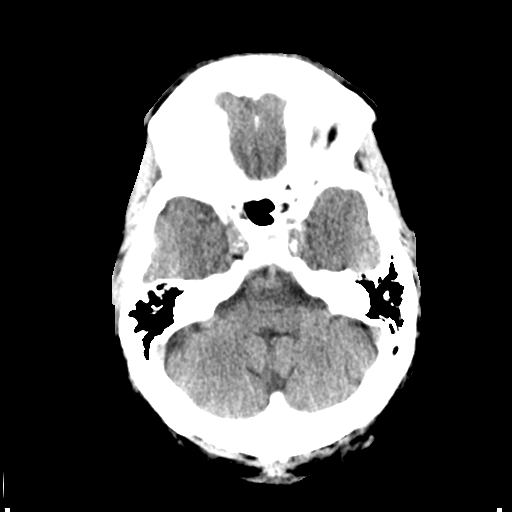
[im 12/32  brain]
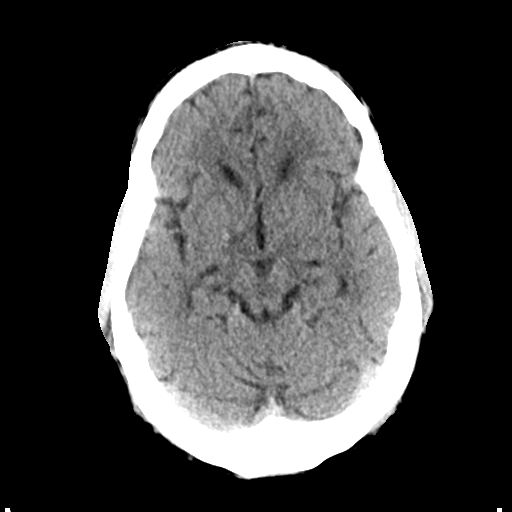
[im 16/32  brain]
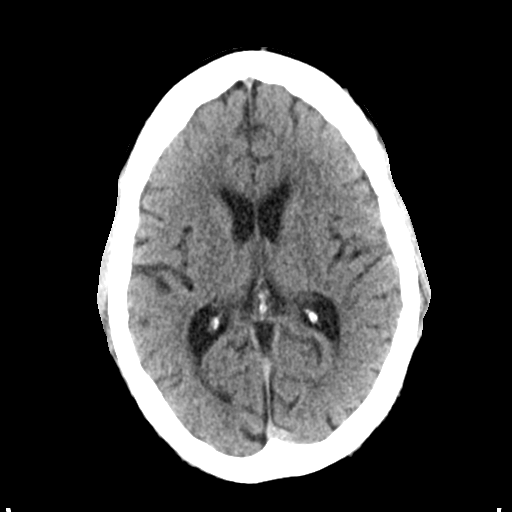
[im 20/32  brain]
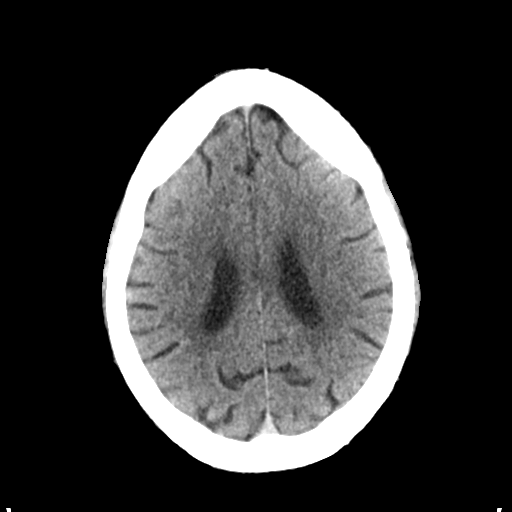
[im 20/32  bone]
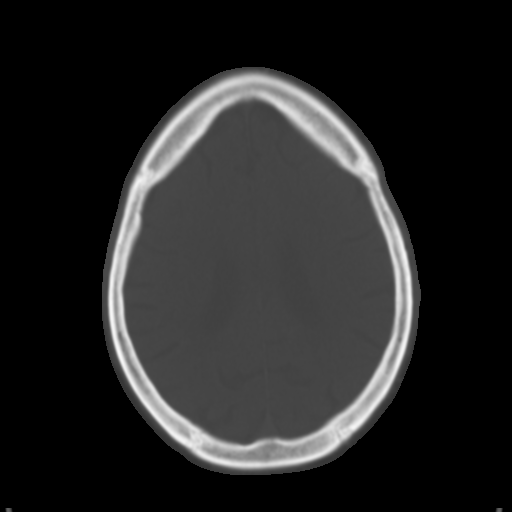
[im 24/32  brain]
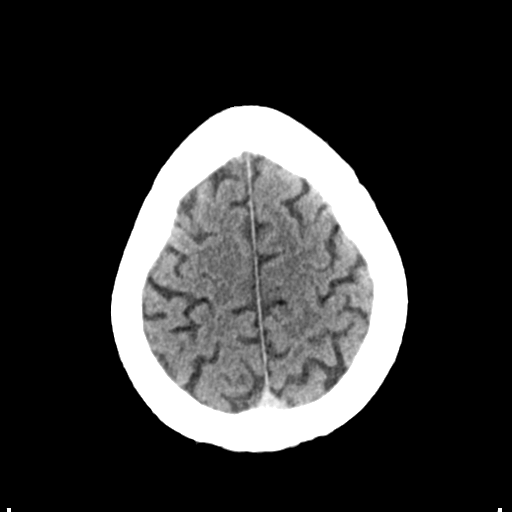
[im 28/32  brain]
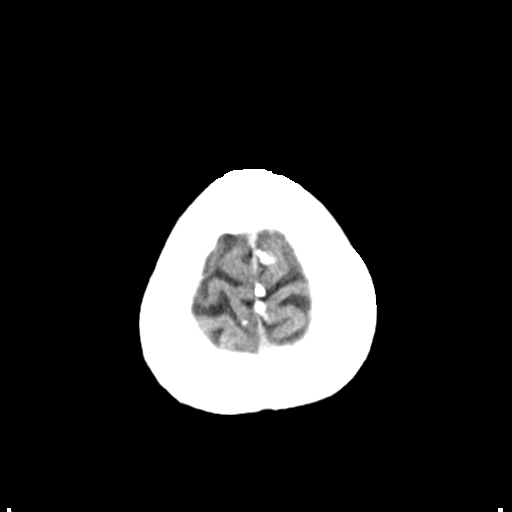

[Series 4: head bone · axial · 0.45mm/px · z∈[-97,-65]mm · 3 of 80 slices shown]
[im 8/80  bone]
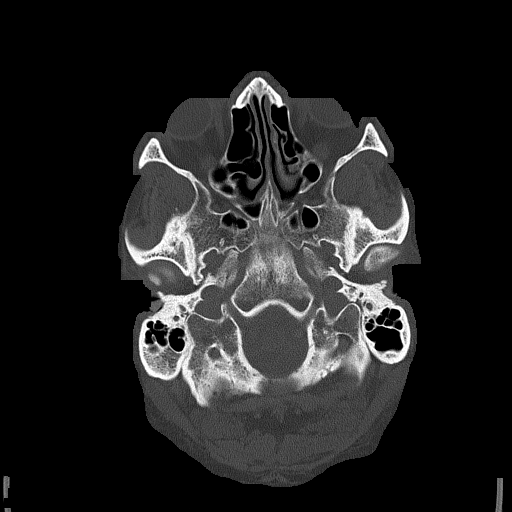
[im 16/80  bone]
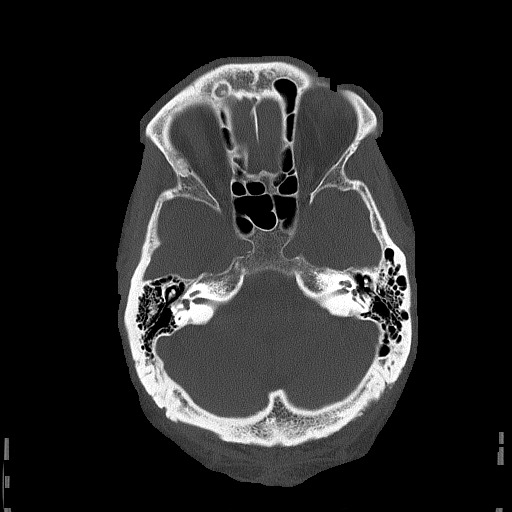
[im 24/80  bone]
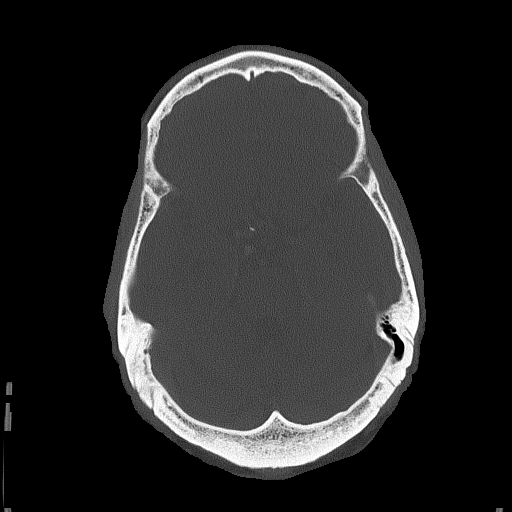

[Series 5: head without cor · coronal · non-contrast · 0.31mm/px · 3 of 67 slices shown]
[im 23/67  brain]
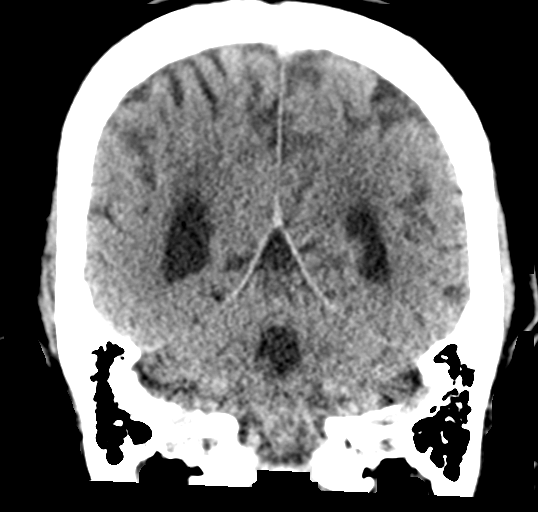
[im 30/67  brain]
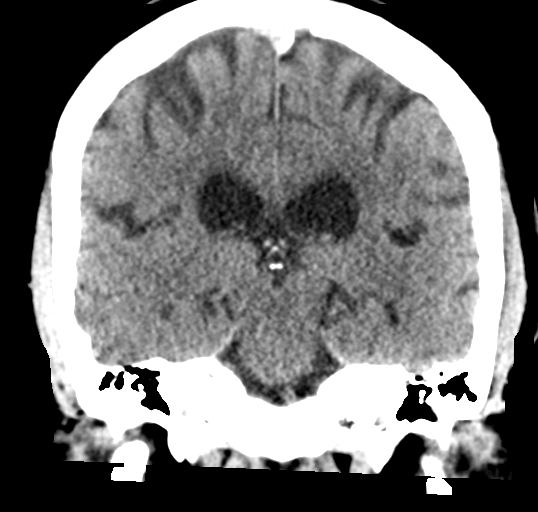
[im 37/67  brain]
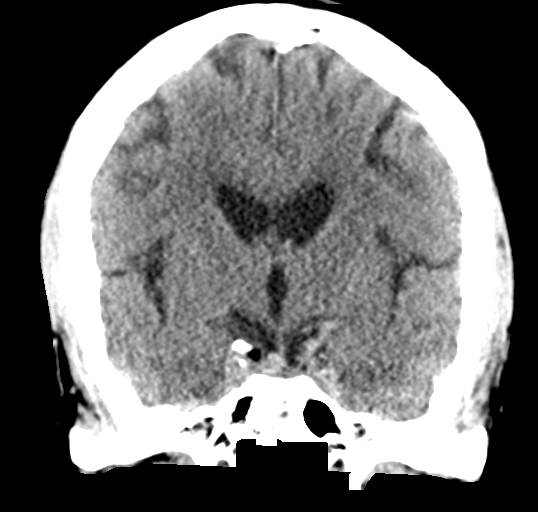

[Series 6: head without sag · sagittal · non-contrast · 0.31mm/px · 3 of 51 slices shown]
[im 17/51  brain]
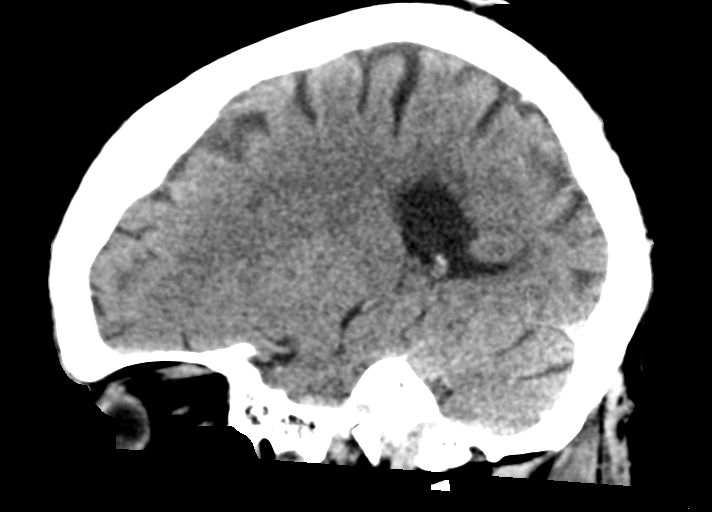
[im 26/51  brain]
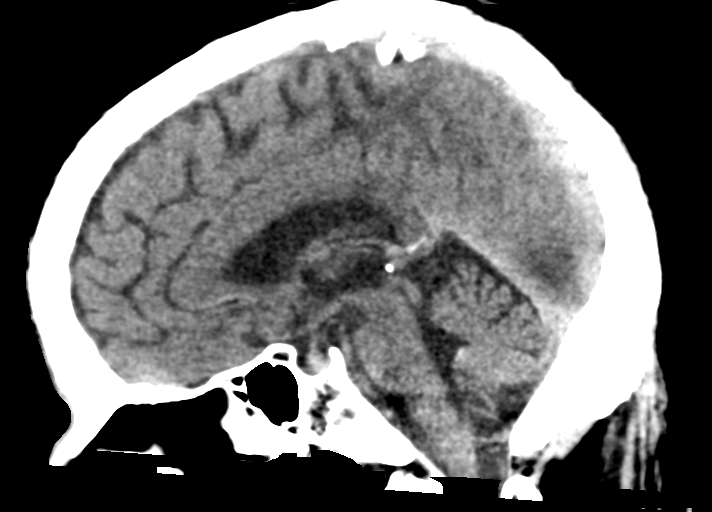
[im 34/51  brain]
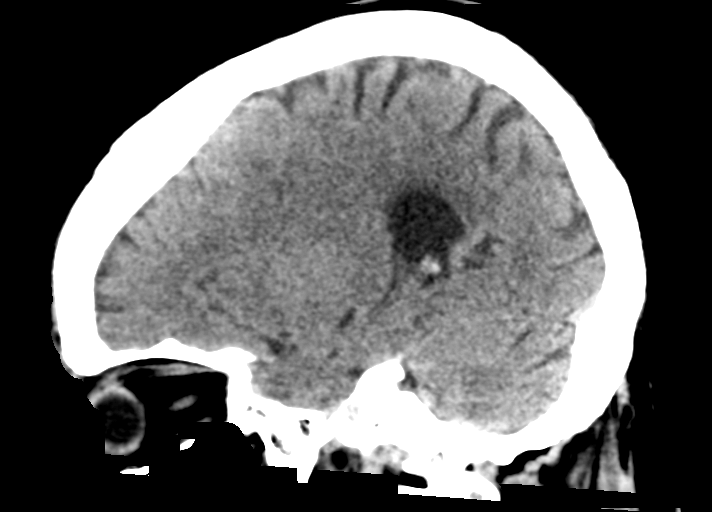

[16 of 47 positions shown; findings below may reference images not displayed]

FINDINGS: Brain: No mass lesion, intraparenchymal hemorrhage or extra-axial
collection. No evidence of acute cortical infarct. Mild
periventricular hypoattenuation.

Vascular: No hyperdense vessel or unexpected calcification.

Skull: Normal visualized skull base, calvarium and extracranial soft
tissues.

Sinuses/Orbits: No sinus fluid levels or advanced mucosal
thickening. No mastoid effusion. Normal orbits.
IMPRESSION: Unchanged examination without acute intracranial abnormality.

## 2018-07-20 ENCOUNTER — Other Ambulatory Visit: Payer: Self-pay

## 2018-07-20 ENCOUNTER — Emergency Department (HOSPITAL_COMMUNITY): Payer: Medicaid Other

## 2018-07-20 ENCOUNTER — Emergency Department (HOSPITAL_BASED_OUTPATIENT_CLINIC_OR_DEPARTMENT_OTHER): Payer: Medicaid Other

## 2018-07-20 ENCOUNTER — Emergency Department (HOSPITAL_COMMUNITY)
Admission: EM | Admit: 2018-07-20 | Discharge: 2018-07-20 | Disposition: A | Discharge status: Home / Self Care | Payer: Medicaid Other | Attending: Emergency Medicine | Admitting: Emergency Medicine

## 2018-07-20 ENCOUNTER — Encounter (HOSPITAL_COMMUNITY): Payer: Self-pay

## 2018-07-20 DIAGNOSIS — R0602 Shortness of breath: Secondary | ICD-10-CM | POA: Diagnosis present

## 2018-07-20 DIAGNOSIS — E1122 Type 2 diabetes mellitus with diabetic chronic kidney disease: Secondary | ICD-10-CM | POA: Insufficient documentation

## 2018-07-20 DIAGNOSIS — R05 Cough: Secondary | ICD-10-CM | POA: Insufficient documentation

## 2018-07-20 DIAGNOSIS — I5033 Acute on chronic diastolic (congestive) heart failure: Secondary | ICD-10-CM | POA: Diagnosis not present

## 2018-07-20 DIAGNOSIS — I2510 Atherosclerotic heart disease of native coronary artery without angina pectoris: Secondary | ICD-10-CM | POA: Insufficient documentation

## 2018-07-20 DIAGNOSIS — N183 Chronic kidney disease, stage 3 (moderate): Secondary | ICD-10-CM | POA: Insufficient documentation

## 2018-07-20 DIAGNOSIS — R0601 Orthopnea: Secondary | ICD-10-CM | POA: Insufficient documentation

## 2018-07-20 DIAGNOSIS — R2241 Localized swelling, mass and lump, right lower limb: Secondary | ICD-10-CM | POA: Insufficient documentation

## 2018-07-20 DIAGNOSIS — I130 Hypertensive heart and chronic kidney disease with heart failure and stage 1 through stage 4 chronic kidney disease, or unspecified chronic kidney disease: Secondary | ICD-10-CM | POA: Diagnosis not present

## 2018-07-20 DIAGNOSIS — I252 Old myocardial infarction: Secondary | ICD-10-CM | POA: Diagnosis not present

## 2018-07-20 DIAGNOSIS — Z794 Long term (current) use of insulin: Secondary | ICD-10-CM | POA: Insufficient documentation

## 2018-07-20 DIAGNOSIS — Z96652 Presence of left artificial knee joint: Secondary | ICD-10-CM | POA: Insufficient documentation

## 2018-07-20 DIAGNOSIS — Z8546 Personal history of malignant neoplasm of prostate: Secondary | ICD-10-CM | POA: Insufficient documentation

## 2018-07-20 DIAGNOSIS — F1410 Cocaine abuse, uncomplicated: Secondary | ICD-10-CM | POA: Diagnosis not present

## 2018-07-20 DIAGNOSIS — Z79899 Other long term (current) drug therapy: Secondary | ICD-10-CM | POA: Insufficient documentation

## 2018-07-20 DIAGNOSIS — E1140 Type 2 diabetes mellitus with diabetic neuropathy, unspecified: Secondary | ICD-10-CM | POA: Diagnosis not present

## 2018-07-20 DIAGNOSIS — R609 Edema, unspecified: Secondary | ICD-10-CM

## 2018-07-20 DIAGNOSIS — F17200 Nicotine dependence, unspecified, uncomplicated: Secondary | ICD-10-CM | POA: Insufficient documentation

## 2018-07-20 DIAGNOSIS — R638 Other symptoms and signs concerning food and fluid intake: Secondary | ICD-10-CM | POA: Diagnosis not present

## 2018-07-20 DIAGNOSIS — F1920 Other psychoactive substance dependence, uncomplicated: Secondary | ICD-10-CM | POA: Insufficient documentation

## 2018-07-20 DIAGNOSIS — I509 Heart failure, unspecified: Secondary | ICD-10-CM

## 2018-07-20 DIAGNOSIS — R0789 Other chest pain: Secondary | ICD-10-CM | POA: Diagnosis not present

## 2018-07-20 LAB — BASIC METABOLIC PANEL
Anion gap: 12 (ref 5–15)
BUN: 11 mg/dL (ref 6–20)
CO2: 20 mmol/L — ABNORMAL LOW (ref 22–32)
Calcium: 8.9 mg/dL (ref 8.9–10.3)
Chloride: 97 mmol/L — ABNORMAL LOW (ref 98–111)
Creatinine, Ser: 1.47 mg/dL — ABNORMAL HIGH (ref 0.61–1.24)
GFR calc Af Amer: 59 mL/min — ABNORMAL LOW (ref >60)
GFR calc non Af Amer: 51 mL/min — ABNORMAL LOW (ref >60)
Glucose, Bld: 173 mg/dL — ABNORMAL HIGH (ref 70–99)
Potassium: 4.9 mmol/L (ref 3.5–5.1)
Sodium: 129 mmol/L — ABNORMAL LOW (ref 135–145)

## 2018-07-20 LAB — CBC WITH DIFFERENTIAL/PLATELET
Abs Immature Granulocytes: 0 10*3/uL (ref 0.0–0.1)
Basophils Absolute: 0.1 10*3/uL (ref 0.0–0.1)
Basophils Relative: 1 %
Eosinophils Absolute: 0.1 10*3/uL (ref 0.0–0.7)
Eosinophils Relative: 2 %
HCT: 38.3 % — ABNORMAL LOW (ref 39.0–52.0)
Hemoglobin: 12.9 g/dL — ABNORMAL LOW (ref 13.0–17.0)
Immature Granulocytes: 0 %
Lymphocytes Relative: 34 %
Lymphs Abs: 1.7 10*3/uL (ref 0.7–4.0)
MCH: 32 pg (ref 26.0–34.0)
MCHC: 33.7 g/dL (ref 30.0–36.0)
MCV: 95 fL (ref 78.0–100.0)
Monocytes Absolute: 0.5 10*3/uL (ref 0.1–1.0)
Monocytes Relative: 10 %
Neutro Abs: 2.7 10*3/uL (ref 1.7–7.7)
Neutrophils Relative %: 53 %
Platelets: 187 10*3/uL (ref 150–400)
RBC: 4.03 MIL/uL — ABNORMAL LOW (ref 4.22–5.81)
RDW: 11.9 % (ref 11.5–15.5)
WBC: 5.1 10*3/uL (ref 4.0–10.5)

## 2018-07-20 LAB — BRAIN NATRIURETIC PEPTIDE: B Natriuretic Peptide: 649.5 pg/mL — ABNORMAL HIGH (ref 0.0–100.0)

## 2018-07-20 LAB — I-STAT TROPONIN, ED
Troponin i, poc: 0.04 ng/mL (ref 0.00–0.08)
Troponin i, poc: 0.04 ng/mL (ref 0.00–0.08)

## 2018-07-20 MED ORDER — CARVEDILOL 12.5 MG PO TABS
12.5000 mg | ORAL_TABLET | Freq: Once | ORAL | Status: AC
  Administered 2018-07-20: 12.5 mg via ORAL
  Filled 2018-07-20: qty 1

## 2018-07-20 MED ORDER — HYDROXYZINE HCL 25 MG PO TABS: 25.0000 mg | ORAL_TABLET | Freq: Every day | ORAL | 0 refills | Status: DC

## 2018-07-20 MED ORDER — FUROSEMIDE 40 MG PO TABS: 40.0000 mg | ORAL_TABLET | Freq: Every day | ORAL | 0 refills | Status: DC

## 2018-07-20 MED ORDER — FUROSEMIDE 10 MG/ML IJ SOLN
40.0000 mg | Freq: Once | INTRAMUSCULAR | Status: AC
  Administered 2018-07-20: 40 mg via INTRAVENOUS
  Filled 2018-07-20: qty 4

## 2018-07-20 NOTE — ED Notes (Signed)
Pt states "I just feel real bad. She just gave me lasix and I should have urinated by now." This RN explained to the patient that he was given lasix 30 minutes ago and that the medicine needs time to work. Pt also states that his "left toes are cramping. I need something to help me sleep. I haven't slept in 2 days." Will make EDP and primary RN aware.

## 2018-07-20 NOTE — Discharge Instructions (Addendum)
Your blood work, EKG and chest x-ray were reassuring.    Please take the furosemide medicine daily starting tomorrow for five days to get the extra fluid off of your lungs.   Return to the ER if you have any new or concerning symptoms like worsening trouble breathing, chest pain, feeling as if you are going to pass out.

## 2018-07-20 NOTE — ED Notes (Signed)
Pt going home in taxi
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
## 2018-07-20 NOTE — ED Triage Notes (Signed)
Pt BIBA for c/o sob and productive cough x 2 days ; pt also reports increased sob when he lays down and increased bilateral lower extremity swelling in the past 2 days ; pt denies any fevers or CP ; hx of afib and CHF

## 2018-07-20 NOTE — Progress Notes (Signed)
Right lower extremity venous duplex completed - Preliminry results. There is no evidenceof a DVT or Baker's cyst. [place] Brad [name],RVS 07/20/2018, 10:10 AM

## 2018-07-25 ENCOUNTER — Inpatient Hospital Stay (HOSPITAL_COMMUNITY)
Admission: EM | Admit: 2018-07-25 | Discharge: 2018-07-27 | DRG: 291 | Disposition: A | Discharge status: Home / Self Care | Payer: Medicaid Other | Attending: Family Medicine | Admitting: Family Medicine

## 2018-07-25 ENCOUNTER — Encounter (HOSPITAL_COMMUNITY): Payer: Self-pay

## 2018-07-25 ENCOUNTER — Other Ambulatory Visit: Payer: Self-pay

## 2018-07-25 ENCOUNTER — Emergency Department (HOSPITAL_COMMUNITY): Payer: Medicaid Other

## 2018-07-25 DIAGNOSIS — I4891 Unspecified atrial fibrillation: Secondary | ICD-10-CM | POA: Diagnosis present

## 2018-07-25 DIAGNOSIS — Z888 Allergy status to other drugs, medicaments and biological substances status: Secondary | ICD-10-CM

## 2018-07-25 DIAGNOSIS — E1165 Type 2 diabetes mellitus with hyperglycemia: Secondary | ICD-10-CM | POA: Diagnosis present

## 2018-07-25 DIAGNOSIS — B182 Chronic viral hepatitis C: Secondary | ICD-10-CM | POA: Diagnosis present

## 2018-07-25 DIAGNOSIS — I252 Old myocardial infarction: Secondary | ICD-10-CM

## 2018-07-25 DIAGNOSIS — E785 Hyperlipidemia, unspecified: Secondary | ICD-10-CM | POA: Diagnosis present

## 2018-07-25 DIAGNOSIS — K7460 Unspecified cirrhosis of liver: Secondary | ICD-10-CM | POA: Diagnosis present

## 2018-07-25 DIAGNOSIS — I484 Atypical atrial flutter: Secondary | ICD-10-CM | POA: Diagnosis not present

## 2018-07-25 DIAGNOSIS — E1122 Type 2 diabetes mellitus with diabetic chronic kidney disease: Secondary | ICD-10-CM | POA: Diagnosis present

## 2018-07-25 DIAGNOSIS — E7800 Pure hypercholesterolemia, unspecified: Secondary | ICD-10-CM | POA: Diagnosis present

## 2018-07-25 DIAGNOSIS — I10 Essential (primary) hypertension: Secondary | ICD-10-CM

## 2018-07-25 DIAGNOSIS — F17210 Nicotine dependence, cigarettes, uncomplicated: Secondary | ICD-10-CM | POA: Diagnosis present

## 2018-07-25 DIAGNOSIS — Z713 Dietary counseling and surveillance: Secondary | ICD-10-CM

## 2018-07-25 DIAGNOSIS — J9601 Acute respiratory failure with hypoxia: Secondary | ICD-10-CM | POA: Diagnosis present

## 2018-07-25 DIAGNOSIS — I4892 Unspecified atrial flutter: Secondary | ICD-10-CM | POA: Diagnosis present

## 2018-07-25 DIAGNOSIS — K219 Gastro-esophageal reflux disease without esophagitis: Secondary | ICD-10-CM | POA: Diagnosis present

## 2018-07-25 DIAGNOSIS — I2510 Atherosclerotic heart disease of native coronary artery without angina pectoris: Secondary | ICD-10-CM | POA: Diagnosis present

## 2018-07-25 DIAGNOSIS — Z794 Long term (current) use of insulin: Secondary | ICD-10-CM

## 2018-07-25 DIAGNOSIS — E871 Hypo-osmolality and hyponatremia: Secondary | ICD-10-CM | POA: Diagnosis present

## 2018-07-25 DIAGNOSIS — I130 Hypertensive heart and chronic kidney disease with heart failure and stage 1 through stage 4 chronic kidney disease, or unspecified chronic kidney disease: Principal | ICD-10-CM | POA: Diagnosis present

## 2018-07-25 DIAGNOSIS — R0602 Shortness of breath: Secondary | ICD-10-CM

## 2018-07-25 DIAGNOSIS — F329 Major depressive disorder, single episode, unspecified: Secondary | ICD-10-CM | POA: Diagnosis present

## 2018-07-25 DIAGNOSIS — I5043 Acute on chronic combined systolic (congestive) and diastolic (congestive) heart failure: Secondary | ICD-10-CM | POA: Diagnosis present

## 2018-07-25 DIAGNOSIS — E1121 Type 2 diabetes mellitus with diabetic nephropathy: Secondary | ICD-10-CM | POA: Diagnosis present

## 2018-07-25 DIAGNOSIS — I428 Other cardiomyopathies: Secondary | ICD-10-CM | POA: Diagnosis present

## 2018-07-25 DIAGNOSIS — E1140 Type 2 diabetes mellitus with diabetic neuropathy, unspecified: Secondary | ICD-10-CM | POA: Diagnosis present

## 2018-07-25 DIAGNOSIS — Z8673 Personal history of transient ischemic attack (TIA), and cerebral infarction without residual deficits: Secondary | ICD-10-CM

## 2018-07-25 DIAGNOSIS — I249 Acute ischemic heart disease, unspecified: Secondary | ICD-10-CM | POA: Diagnosis not present

## 2018-07-25 DIAGNOSIS — Z7289 Other problems related to lifestyle: Secondary | ICD-10-CM

## 2018-07-25 DIAGNOSIS — N183 Chronic kidney disease, stage 3 (moderate): Secondary | ICD-10-CM | POA: Diagnosis present

## 2018-07-25 DIAGNOSIS — Z96652 Presence of left artificial knee joint: Secondary | ICD-10-CM | POA: Diagnosis present

## 2018-07-25 DIAGNOSIS — I361 Nonrheumatic tricuspid (valve) insufficiency: Secondary | ICD-10-CM | POA: Diagnosis not present

## 2018-07-25 DIAGNOSIS — Z9114 Patient's other noncompliance with medication regimen: Secondary | ICD-10-CM

## 2018-07-25 DIAGNOSIS — Z8249 Family history of ischemic heart disease and other diseases of the circulatory system: Secondary | ICD-10-CM

## 2018-07-25 DIAGNOSIS — Z809 Family history of malignant neoplasm, unspecified: Secondary | ICD-10-CM

## 2018-07-25 DIAGNOSIS — Z79899 Other long term (current) drug therapy: Secondary | ICD-10-CM

## 2018-07-25 DIAGNOSIS — Z560 Unemployment, unspecified: Secondary | ICD-10-CM

## 2018-07-25 DIAGNOSIS — F1490 Cocaine use, unspecified, uncomplicated: Secondary | ICD-10-CM | POA: Diagnosis present

## 2018-07-25 DIAGNOSIS — Z7982 Long term (current) use of aspirin: Secondary | ICD-10-CM

## 2018-07-25 LAB — BASIC METABOLIC PANEL
Anion gap: 13 (ref 5–15)
BUN: 10 mg/dL (ref 6–20)
CO2: 20 mmol/L — ABNORMAL LOW (ref 22–32)
Calcium: 8.9 mg/dL (ref 8.9–10.3)
Chloride: 95 mmol/L — ABNORMAL LOW (ref 98–111)
Creatinine, Ser: 1.46 mg/dL — ABNORMAL HIGH (ref 0.61–1.24)
GFR calc Af Amer: 59 mL/min — ABNORMAL LOW (ref >60)
GFR calc non Af Amer: 51 mL/min — ABNORMAL LOW (ref >60)
Glucose, Bld: 165 mg/dL — ABNORMAL HIGH (ref 70–99)
Potassium: 4 mmol/L (ref 3.5–5.1)
Sodium: 128 mmol/L — ABNORMAL LOW (ref 135–145)

## 2018-07-25 LAB — BRAIN NATRIURETIC PEPTIDE: B Natriuretic Peptide: 713 pg/mL — ABNORMAL HIGH (ref 0.0–100.0)

## 2018-07-25 LAB — CBC
HCT: 40.9 % (ref 39.0–52.0)
Hemoglobin: 13.9 g/dL (ref 13.0–17.0)
MCH: 32.5 pg (ref 26.0–34.0)
MCHC: 34 g/dL (ref 30.0–36.0)
MCV: 95.6 fL (ref 78.0–100.0)
Platelets: 206 10*3/uL (ref 150–400)
RBC: 4.28 MIL/uL (ref 4.22–5.81)
RDW: 12.4 % (ref 11.5–15.5)
WBC: 6 10*3/uL (ref 4.0–10.5)

## 2018-07-25 LAB — RAPID URINE DRUG SCREEN, HOSP PERFORMED
Amphetamines: NOT DETECTED
Barbiturates: NOT DETECTED
Benzodiazepines: NOT DETECTED
Cocaine: POSITIVE — AB
Opiates: NOT DETECTED
Tetrahydrocannabinol: NOT DETECTED

## 2018-07-25 LAB — TROPONIN I
Troponin I: 0.07 ng/mL (ref <0.03)
Troponin I: 0.07 ng/mL (ref <0.03)

## 2018-07-25 LAB — I-STAT TROPONIN, ED
Troponin i, poc: 0.1 ng/mL (ref 0.00–0.08)
Troponin i, poc: 0.06 ng/mL (ref 0.00–0.08)

## 2018-07-25 LAB — GLUCOSE, CAPILLARY: Glucose-Capillary: 175 mg/dL — ABNORMAL HIGH (ref 70–99)

## 2018-07-25 MED ORDER — NITROGLYCERIN 0.4 MG SL SUBL
0.4000 mg | SUBLINGUAL_TABLET | SUBLINGUAL | Status: DC | PRN
  Administered 2018-07-25 (×2): 0.4 mg via SUBLINGUAL
  Filled 2018-07-25: qty 1

## 2018-07-25 MED ORDER — HEPARIN SODIUM (PORCINE) 5000 UNIT/ML IJ SOLN
5000.0000 [IU] | Freq: Three times a day (TID) | INTRAMUSCULAR | Status: DC
  Administered 2018-07-26 (×3): 5000 [IU] via SUBCUTANEOUS
  Filled 2018-07-25 (×3): qty 1

## 2018-07-25 MED ORDER — NICOTINE 14 MG/24HR TD PT24
14.0000 mg | MEDICATED_PATCH | Freq: Every day | TRANSDERMAL | Status: DC
  Administered 2018-07-26 – 2018-07-27 (×2): 14 mg via TRANSDERMAL
  Filled 2018-07-25 (×2): qty 1

## 2018-07-25 MED ORDER — ASPIRIN 81 MG PO CHEW
324.0000 mg | CHEWABLE_TABLET | Freq: Once | ORAL | Status: AC
  Administered 2018-07-25: 324 mg via ORAL
  Filled 2018-07-25: qty 4

## 2018-07-25 MED ORDER — FUROSEMIDE 40 MG PO TABS: 40.0000 mg | ORAL_TABLET | Freq: Every day | ORAL | 0 refills | Status: DC

## 2018-07-25 MED ORDER — THIAMINE HCL 100 MG/ML IJ SOLN
100.0000 mg | Freq: Every day | INTRAMUSCULAR | Status: DC
  Filled 2018-07-25: qty 2

## 2018-07-25 MED ORDER — ADULT MULTIVITAMIN W/MINERALS CH
1.0000 | ORAL_TABLET | Freq: Every day | ORAL | Status: DC
  Administered 2018-07-26 – 2018-07-27 (×2): 1 via ORAL
  Filled 2018-07-25 (×2): qty 1

## 2018-07-25 MED ORDER — HYDRALAZINE HCL 20 MG/ML IJ SOLN
5.0000 mg | INTRAMUSCULAR | Status: DC | PRN
  Administered 2018-07-25: 5 mg via INTRAVENOUS
  Filled 2018-07-25: qty 1

## 2018-07-25 MED ORDER — INSULIN ASPART 100 UNIT/ML ~~LOC~~ SOLN
0.0000 [IU] | Freq: Three times a day (TID) | SUBCUTANEOUS | Status: DC
  Administered 2018-07-26: 2 [IU] via SUBCUTANEOUS
  Administered 2018-07-26 (×2): 3 [IU] via SUBCUTANEOUS
  Administered 2018-07-27: 8 [IU] via SUBCUTANEOUS

## 2018-07-25 MED ORDER — AMIODARONE HCL IN DEXTROSE 360-4.14 MG/200ML-% IV SOLN
60.0000 mg/h | INTRAVENOUS | Status: AC
  Administered 2018-07-26 (×2): 60 mg/h via INTRAVENOUS
  Filled 2018-07-25: qty 200

## 2018-07-25 MED ORDER — SODIUM CHLORIDE 0.9% FLUSH
3.0000 mL | Freq: Two times a day (BID) | INTRAVENOUS | Status: DC
  Administered 2018-07-26 – 2018-07-27 (×4): 3 mL via INTRAVENOUS

## 2018-07-25 MED ORDER — VITAMIN B-1 100 MG PO TABS
100.0000 mg | ORAL_TABLET | Freq: Every day | ORAL | Status: DC
  Administered 2018-07-26 – 2018-07-27 (×2): 100 mg via ORAL
  Filled 2018-07-25 (×2): qty 1

## 2018-07-25 MED ORDER — FUROSEMIDE 10 MG/ML IJ SOLN
20.0000 mg | Freq: Once | INTRAMUSCULAR | Status: AC
  Administered 2018-07-25: 20 mg via INTRAVENOUS
  Filled 2018-07-25: qty 2

## 2018-07-25 MED ORDER — LORAZEPAM 2 MG/ML IJ SOLN: 0.5000 mg | Freq: Four times a day (QID) | INTRAMUSCULAR | Status: DC | PRN

## 2018-07-25 MED ORDER — AMLODIPINE BESYLATE 10 MG PO TABS
10.0000 mg | ORAL_TABLET | Freq: Every day | ORAL | Status: DC
  Administered 2018-07-25: 10 mg via ORAL
  Filled 2018-07-25: qty 1

## 2018-07-25 MED ORDER — AMIODARONE LOAD VIA INFUSION
150.0000 mg | Freq: Once | INTRAVENOUS | Status: AC
  Administered 2018-07-26: 150 mg via INTRAVENOUS
  Filled 2018-07-25: qty 83.34

## 2018-07-25 MED ORDER — HYDRALAZINE HCL 20 MG/ML IJ SOLN: 5.0000 mg | INTRAMUSCULAR | Status: DC | PRN

## 2018-07-25 MED ORDER — LORAZEPAM 0.5 MG PO TABS: 0.5000 mg | ORAL_TABLET | Freq: Four times a day (QID) | ORAL | Status: DC | PRN

## 2018-07-25 MED ORDER — FOLIC ACID 1 MG PO TABS
1.0000 mg | ORAL_TABLET | Freq: Every day | ORAL | Status: DC
  Administered 2018-07-26 – 2018-07-27 (×2): 1 mg via ORAL
  Filled 2018-07-25 (×2): qty 1

## 2018-07-25 MED ORDER — AMIODARONE HCL IN DEXTROSE 360-4.14 MG/200ML-% IV SOLN
30.0000 mg/h | INTRAVENOUS | Status: DC
  Administered 2018-07-26: 30 mg/h via INTRAVENOUS
  Filled 2018-07-25 (×2): qty 200

## 2018-07-25 NOTE — ED Notes (Signed)
Pt has been pain free-- denies any chest pain
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
## 2018-07-25 NOTE — ED Notes (Signed)
Ordered  Carb mod

## 2018-07-25 NOTE — ED Notes (Signed)
Pt remains painfree

## 2018-07-25 NOTE — ED Notes (Signed)
Pt ambulated in hallway. Pt denied dizziness. Ambulated independently. O2 sat at 98-100%. MD informed

## 2018-07-25 NOTE — ED Notes (Signed)
CRITICAL I-STAT TROP   I-STAT TROPONIN RESULTS: 0.10   RESULTS GIVEN TO: Santiago Glad, RN and Wilson Singer, MD @ [phone]

## 2018-07-25 NOTE — ED Triage Notes (Signed)
Pt brought in by [facility] from home for SOB, afib w/ rvr. Pt given 5mg  metoprolol PTA- HR from 127 to 80. Pt seen for same x1 week ago, admitted for "fluid buildup". Pt d/c'd with prescription for lasix. Per EMS pt not on blood thinners- pt states he was taken off of it "months ago". Pt endorses left sided chest pain that worsens with movement. Per EMS pt lung sounds diminished. Pt A+Ox4 and in NAD at this time.
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
## 2018-07-25 NOTE — ED Provider Notes (Signed)
Transfer of Care Note I assumed care of Brad [name] on 07/25/2018   Briefly, Brad [name] is a 57 y.o. male who:  Chest pain  Nausea/vomiting overnight  Hx of cocaine use (last 2 days ago)  Initial trop 0.10  EKG unchanged  CXR okay  The plan includes: Ambulate   Restart lasix until he can followup with cardiology   Please refer to the original provider's note for additional information regarding the care of Brad [name].  ### Reassessment: I personally reassessed the patient:  Vital Signs:  The most current vitals were  Vitals:   07/25/18 1130 07/25/18 1200  BP: (!) 138/98 (!) 157/104  Pulse: 100 99  Resp: 16 14  Temp:    SpO2: 97% 97%    Hemodynamics:  The patient is hemodynamically stable. Mental Status:  The patient is Alert  Additional MDM: I personally reassessed the patient.  He is tachypneic to 20, tachycardic to 110, and short of breath.  He also reports having intermittent chest pain.  He reports worsening swelling over the past several days in his bilateral LE.  He says this feels similar to his heart failure problems.  20 of IV lasix ordered prior to being signed out to me.  I reviewed his ECG and testing today.  No acute abnormalities on his EKG.  His BNP is elevated well above his prior baseline.  On exam, I also noticed unilateral right lower extremity swelling.  He had a DVT study of his extremity on 7/30.  The study was negative.  I do not think that repeat DVT is indicated.  I suspect that he is currently having a acute on chronic heart failure exacerbation.  I believe that he would benefit from inpatient admission.  He was admitted to the floor in stable condition.  The admitted service and I discussed further evaluating him for PE once he is inpatient if they deem that it is indicated.  Given his presentation that is concerning for a CHF exacerbation and his recent negative DVT study, I do not think that further ED work-up for PE is  indicated.  The care of this patient was supervised by Dr. Tomi Bamberger, who agreed with the plan and management of the patient.    Alford Highland, MD 07/25/18 7915    Dorie Rank, MD 07/27/18 [phone]

## 2018-07-25 NOTE — ED Notes (Signed)
Pt asking for Lasix. Rn informed

## 2018-07-26 ENCOUNTER — Other Ambulatory Visit (HOSPITAL_COMMUNITY): Payer: Medicaid Other

## 2018-07-26 DIAGNOSIS — I484 Atypical atrial flutter: Secondary | ICD-10-CM

## 2018-07-26 DIAGNOSIS — I4892 Unspecified atrial flutter: Secondary | ICD-10-CM

## 2018-07-26 DIAGNOSIS — I5043 Acute on chronic combined systolic (congestive) and diastolic (congestive) heart failure: Secondary | ICD-10-CM

## 2018-07-26 DIAGNOSIS — I249 Acute ischemic heart disease, unspecified: Secondary | ICD-10-CM

## 2018-07-26 DIAGNOSIS — R0602 Shortness of breath: Secondary | ICD-10-CM

## 2018-07-26 LAB — GLUCOSE, CAPILLARY
Glucose-Capillary: 191 mg/dL — ABNORMAL HIGH (ref 70–99)
Glucose-Capillary: 127 mg/dL — ABNORMAL HIGH (ref 70–99)
Glucose-Capillary: 169 mg/dL — ABNORMAL HIGH (ref 70–99)
Glucose-Capillary: 202 mg/dL — ABNORMAL HIGH (ref 70–99)

## 2018-07-26 LAB — COMPREHENSIVE METABOLIC PANEL
ALT: 14 U/L (ref 0–44)
AST: 23 U/L (ref 15–41)
Albumin: 3.2 g/dL — ABNORMAL LOW (ref 3.5–5.0)
Alkaline Phosphatase: 61 U/L (ref 38–126)
Anion gap: 14 (ref 5–15)
BUN: 15 mg/dL (ref 6–20)
CO2: 23 mmol/L (ref 22–32)
Calcium: 9.2 mg/dL (ref 8.9–10.3)
Chloride: 95 mmol/L — ABNORMAL LOW (ref 98–111)
Creatinine, Ser: 1.67 mg/dL — ABNORMAL HIGH (ref 0.61–1.24)
GFR calc Af Amer: 51 mL/min — ABNORMAL LOW (ref >60)
GFR calc non Af Amer: 44 mL/min — ABNORMAL LOW (ref >60)
Glucose, Bld: 195 mg/dL — ABNORMAL HIGH (ref 70–99)
Potassium: 3.9 mmol/L (ref 3.5–5.1)
Sodium: 132 mmol/L — ABNORMAL LOW (ref 135–145)
Total Bilirubin: 1.6 mg/dL — ABNORMAL HIGH (ref 0.3–1.2)
Total Protein: 8 g/dL (ref 6.5–8.1)

## 2018-07-26 LAB — URINALYSIS, ROUTINE W REFLEX MICROSCOPIC
Bacteria, UA: NONE SEEN
Bilirubin Urine: NEGATIVE
Glucose, UA: NEGATIVE mg/dL
Ketones, ur: NEGATIVE mg/dL
Leukocytes, UA: NEGATIVE
Nitrite: NEGATIVE
Protein, ur: 100 mg/dL — AB
Specific Gravity, Urine: 1.014 (ref 1.005–1.030)
pH: 5 (ref 5.0–8.0)

## 2018-07-26 LAB — CBC
HCT: 39.5 % (ref 39.0–52.0)
Hemoglobin: 13.7 g/dL (ref 13.0–17.0)
MCH: 32.5 pg (ref 26.0–34.0)
MCHC: 34.7 g/dL (ref 30.0–36.0)
MCV: 93.6 fL (ref 78.0–100.0)
Platelets: 208 10*3/uL (ref 150–400)
RBC: 4.22 MIL/uL (ref 4.22–5.81)
RDW: 12.3 % (ref 11.5–15.5)
WBC: 6.5 10*3/uL (ref 4.0–10.5)

## 2018-07-26 LAB — HEMOGLOBIN A1C
Hgb A1c MFr Bld: 7.6 % — ABNORMAL HIGH (ref 4.8–5.6)
Mean Plasma Glucose: 171.42 mg/dL

## 2018-07-26 LAB — TROPONIN I
Troponin I: 0.06 ng/mL (ref <0.03)
Troponin I: 0.06 ng/mL (ref <0.03)

## 2018-07-26 LAB — MAGNESIUM: Magnesium: 1.7 mg/dL (ref 1.7–2.4)

## 2018-07-26 LAB — HIV ANTIBODY (ROUTINE TESTING W REFLEX): HIV Screen 4th Generation wRfx: NONREACTIVE

## 2018-07-26 MED ORDER — CLONIDINE HCL 0.1 MG PO TABS
0.1000 mg | ORAL_TABLET | Freq: Every day | ORAL | Status: DC
  Administered 2018-07-26 – 2018-07-27 (×2): 0.1 mg via ORAL
  Filled 2018-07-26 (×2): qty 1

## 2018-07-26 MED ORDER — DICLOFENAC SODIUM 1 % TD GEL
2.0000 g | Freq: Two times a day (BID) | TRANSDERMAL | Status: DC | PRN
  Administered 2018-07-26: 2 g via TOPICAL
  Filled 2018-07-26: qty 100

## 2018-07-26 MED ORDER — GABAPENTIN 300 MG PO CAPS
600.0000 mg | ORAL_CAPSULE | Freq: Two times a day (BID) | ORAL | Status: DC
  Administered 2018-07-26: 600 mg via ORAL
  Filled 2018-07-26: qty 2

## 2018-07-26 MED ORDER — FUROSEMIDE 10 MG/ML IJ SOLN
40.0000 mg | Freq: Every day | INTRAMUSCULAR | Status: DC
  Administered 2018-07-26: 40 mg via INTRAVENOUS
  Filled 2018-07-26: qty 4

## 2018-07-26 MED ORDER — GABAPENTIN 400 MG PO CAPS
700.0000 mg | ORAL_CAPSULE | Freq: Two times a day (BID) | ORAL | Status: DC
  Administered 2018-07-26 – 2018-07-27 (×2): 700 mg via ORAL
  Filled 2018-07-26 (×2): qty 1

## 2018-07-26 MED ORDER — AMLODIPINE BESYLATE 10 MG PO TABS
10.0000 mg | ORAL_TABLET | Freq: Every day | ORAL | Status: DC
  Administered 2018-07-26 – 2018-07-27 (×3): 10 mg via ORAL
  Filled 2018-07-26 (×2): qty 1

## 2018-07-26 MED ORDER — AMITRIPTYLINE HCL 75 MG PO TABS
75.0000 mg | ORAL_TABLET | Freq: Every day | ORAL | Status: DC
  Administered 2018-07-26 (×2): 75 mg via ORAL
  Filled 2018-07-26 (×2): qty 1

## 2018-07-26 MED ORDER — APIXABAN 5 MG PO TABS
5.0000 mg | ORAL_TABLET | Freq: Two times a day (BID) | ORAL | Status: DC
  Administered 2018-07-26 – 2018-07-27 (×2): 5 mg via ORAL
  Filled 2018-07-26 (×3): qty 1

## 2018-07-26 MED ORDER — ACETAMINOPHEN 325 MG PO TABS
650.0000 mg | ORAL_TABLET | Freq: Three times a day (TID) | ORAL | Status: DC | PRN
  Administered 2018-07-26: 650 mg via ORAL
  Filled 2018-07-26: qty 2

## 2018-07-26 NOTE — Discharge Summary (Signed)
[facility] Discharge Summary  Patient name: Brad [name] Medical record number: [id number] Date of birth: 03/23/1961 Age: 57 y.o. Gender: male Date of Admission: 07/25/2018  Date of Discharge: 07/27/2017 Admitting Physician: Lind Covert, MD  Primary Care Provider: Charlott Rakes, MD Consultants: Cardiology  Indication for Hospitalization: Chest pain w/ SOB  Discharge Diagnoses/Problem List:  Chest pain w/ DOE A-flutter Systolic/diastolic heart failure HTN DM Cocaine use Dysuria GERD Alcohol Tobacco use  Disposition: DC home  Discharge Condition: Stable  Discharge Exam:  General: Alert and cooperative and appears to be in no acute distress HEENT: Neck non-tender without lymphadenopathy, masses or thyromegaly Cardio: Normal A1 and S2, no S3 or S4. Irregularly irregular rhythm. No murmurs or rubs.   Pulm: Clear to auscultation bilaterally, no crackles, wheezing, or diminished breath sounds. Normal respiratory effort Abdomen: Bowel sounds normal. Abdomen soft and non-tender.  Extremities: No peripheral edema. Warm/ well perfused.  Strong radial pulses. Neuro: Cranial nerves grossly intact  Brief Hospital Course:  Mr. Reiber 57 year old male who presented to the ED with chest pressure and dyspnea on exertion.  His past medical history is significant for cocaine use systolic/diastolic heart failure, tobacco use, diabetes, GERD, cirrhosis, CKD stage III.  Troponins were trended overnight from a mild elevation of 0.1 to 0.07. ECG was remarkable for a-flutter without ST changes.  BNP was elevated but physical exam did not show signs of volume overload. Cardiology was consulted for management of a-flutter (seen at previous hospitalization but unmedicated.) His SOB did appear to resolve overnight although he continued to complain of substernal chest pressure.  This pressure is thought to be related to his cardiac arrhythmia.  Cardiology recommended  no rate or rhythm control but anticoagulation with Plavix for now.  Patient can follow-up with cardiology outpatient at 4- 6 weeks for follow-up and potential cardioversion.  We did not treat him for heart failure during this hospitalization as his physical exam never showed evidence of volume overload.  On the night prior to discharge he had another episode of mild chest pressure followed by shortness of breath.  It was explained to him that these episodes are likely related to his A. fib flutter and they are not expected to entirely resolve until the a-flutter has been addressed.  He was informed he does not need to seek immediate medical attention unless he notices a change in these episodes of mild transient chest pressure and shortness of breath.  At the time of discharge he was not experiencing these symptoms.  During his time in hospital his clonidine was discontinued and not refilled upon discharge.  It was felt that this medication could be replaced by others with lower side effect profiles especially in a patient who does have some risk of missing doses.  Issues for Follow Up:  1. Follow-up with your PCP regarding management of your chronic diseases which include heart failure, diabetes, GERD, cirrhosis.  Please ensure that you have adequate access to the medications we refilled in the hospital.  Mr. Blackerby expressed interest in the cessation of smoking, alcohol, cocaine in light of his  worsening medical condition.  Please provide appropriate resources for him to succeed with healthy lifestyle choices. 2. Follow-up with your PCP regarding change in blood pressure medication.  Please ensure that blood pressure is well controlled on an appropriate medical regimen. 3. Follow-up with cardiology in 4 to 6 weeks for a flutter.  At that time you can be assessed for potential cardioversion.  Significant  Procedures: none  Significant Labs and Imaging:  Recent Labs  Lab 07/25/18 1119 07/26/18 0541  07/27/18 0616  WBC 6.0 6.5 4.9  HGB 13.9 13.7 13.2  HCT 40.9 39.5 37.2*  PLT 206 208 175   Recent Labs  Lab 07/25/18 1119 07/26/18 0541 07/27/18 0616  NA 128* 132* 135  K 4.0 3.9 3.2*  CL 95* 95* 97*  CO2 20* 23 25  GLUCOSE 165* 195* 163*  BUN 10 15 14   CREATININE 1.46* 1.67* 1.54*  CALCIUM 8.9 9.2 8.8*  MG  --  1.7  --   ALKPHOS  --  61  --   AST  --  23  --   ALT  --  14  --   ALBUMIN  --  3.2*  --     Dg Chest 2 View  Result Date: 07/25/2018 CLINICAL DATA:  Chest pain with shortness of breath. EXAM: CHEST - 2 VIEW COMPARISON:  Radiographs 07/20/2018 and 03/16/2018. FINDINGS: Stable cardiomegaly and mediastinal contours. There is chronic central airway and fissural thickening, but no overt pulmonary edema, confluent airspace opacity or significant pleural effusion. The costophrenic angles are incompletely visualized on the lateral projection. No acute osseous findings are seen. Telemetry leads overlie the chest. IMPRESSION: Similar appearance of the chest with cardiomegaly, central airway and fissural thickening. No overt pulmonary edema. Electronically Signed   By: Richardean Sale M.D.   On: 07/25/2018 12:15   Dg Chest 2 View  Result Date: 07/20/2018 CLINICAL DATA:  Shortness of breath for 3 days. EXAM: CHEST - 2 VIEW COMPARISON:  PA and lateral chest 03/16/2018. CT chest and PA and lateral chest 12/16/2017. FINDINGS: There is cardiomegaly without pulmonary edema. No consolidative process, pneumothorax or effusion. Aortic atherosclerosis is noted. No focal bony abnormality. IMPRESSION: No acute disease. Cardiomegaly. Atherosclerosis. Electronically Signed   By: Inge Rise M.D.   On: 07/20/2018 09:15      Results/Tests Pending at Time of Discharge: none  Discharge Medications:  Allergies as of 07/27/2018      Reactions   Lisinopril Anaphylaxis, Other (See Comments)   Throat swells   Pamelor [nortriptyline Hcl] Anaphylaxis, Swelling   Throat swells   Angiotensin  Receptor Blockers Other (See Comments)   (Angioedema with lisinopril, therefore ARB's are contraindicated)      Medication List    STOP taking these medications   aspirin 325 MG tablet   BIOTENE DRY MOUTH GENTLE Liqd   cloNIDine 0.1 MG tablet Commonly known as:  CATAPRES   DULoxetine 60 MG capsule Commonly known as:  CYMBALTA   hydrOXYzine 25 MG tablet Commonly known as:  ATARAX/VISTARIL     TAKE these medications   amitriptyline 75 MG tablet Commonly known as:  ELAVIL Take 1 tablet (75 mg total) by mouth at bedtime.   amLODipine 10 MG tablet Commonly known as:  NORVASC Take 1 tablet (10 mg total) by mouth daily.   apixaban 5 MG Tabs tablet Commonly known as:  ELIQUIS Take 1 tablet (5 mg total) by mouth 2 (two) times daily.   atorvastatin 40 MG tablet Commonly known as:  LIPITOR Take 1 tablet (40 mg total) by mouth daily.   carvedilol 6.25 MG tablet Commonly known as:  COREG Take 1 tablet (6.25 mg total) by mouth 2 (two) times daily with a meal. What changed:    medication strength  how much to take   diclofenac sodium 1 % Gel Commonly known as:  VOLTAREN Apply 4 g topically 4 (four) times  daily as needed (to painful sites).   furosemide 20 MG tablet Commonly known as:  LASIX Take 1 tablet (20 mg total) by mouth daily. What changed:  Another medication with the same name was removed. Continue taking this medication, and follow the directions you see here.   gabapentin 300 MG capsule Commonly known as:  NEURONTIN Take 2 capsules (600 mg total) by mouth 2 (two) times daily. What changed:  how much to take   glucose blood test strip Use as instructed   hydrocortisone 2.5 % cream Apply 1 application topically 2 (two) times daily as needed (rash).   Insulin Glargine 100 UNIT/ML Solostar Pen Commonly known as:  LANTUS SOLOSTAR Inject 35 Units into the skin 2 (two) times daily. What changed:  how much to take   omeprazole 20 MG capsule Commonly known  as:  PRILOSEC Take 1 capsule (20 mg total) by mouth daily. What changed:    when to take this  reasons to take this   PEN NEEDLES 31GX5/16" 31G X 8 MM Misc Use as directed       Discharge Instructions: Please refer to Patient Instructions section of EMR for full details.  Patient was counseled important signs and symptoms that should prompt return to medical care, changes in medications, dietary instructions, activity restrictions, and follow up appointments.   Follow-Up Appointments: Follow-up Information    Hilty, Nadean Corwin, MD In 3 days.   Specialty:  Cardiology Contact information: [place] [address] [phone]        Charlott Rakes, MD In 3 days.   Specialty:  Family Medicine Why:  Call on 08/02/18 for appointment Contact information: [place] [address] [phone]           Matilde Haymaker, MD 07/27/2018, 9:17 PM PGY-1, [place]

## 2018-07-26 NOTE — Progress Notes (Signed)
Pt was c/o L flank/back pain, he said it happened before the Amiodarone was started, MD notified, and ordered Tylenol and Voltaren ointment later and after the Voltaren the pain went away, will continue to monitor, Thanks Arvella Nigh RN.

## 2018-07-26 NOTE — Progress Notes (Signed)
Family Medicine Teaching Service Daily Progress Note Intern Pager: [phone]  Patient name: Brad [name] Medical record number: [id number] Date of birth: [birth year] Age: 57 y.o. Gender: male  Primary Care Provider: Charlott Rakes, MD Consultants: none Code Status: limited, DNI, confirmed with pt on admission  Pt Overview and Major Events to Date:  8/4 - admitted for CHF  Assessment and Plan: Brad [name] is a 57 y.o. male presenting with 1 day history of central non-radiating chest pressure and DOE and w/ rest. Past history includes hypertension, chronic combined systolic and diastolic heart failure with EF 35-40%, tobacco use, insulin dependent diabetes mellitus, recent cocaine use 2-4 days ago, GERD, cirrhosis due to hepatitis C, history of heroin use disorder (states he is sober) and CKD III.   Chest pain w/ DOE trops trending down, 0.1>0.07>0.07. BNP 713 with BL ~200. Am ECG with aflutter with variable conduction.   No events overnight on tele. 400 mL UOP 8/4.  Dyspnea appears remarkably improved.  Pt was lying flat in bed this am with no trouble breathing on room air.  Pt remarked that his breathing has improved in the past 24 hrs, though he still is experiencing reproducible chest pain. JVD normal. Weights inaccurate: 190-179 after diuresing 400 mL. -f/u Echocardiogram  -DC lasix -Strict I&O's; fluid restriction <2L   Aflutter Admission ECG with new aflutter, with rates 100-120. Received amio bolus per cards recs. Am ECG unchanged. -continue amio infusion 30 mL/hr -avoid BB in the setting of cocaine use -f/u mg level -cards consult for a-flutter medicaton  Combined systolic/diastolic heart failure: Not fluid overloaded on exam today (8/5). Pt does not appear to be in ADHF. Home meds: amlodipine, carvedilol, clonidine, atorvastatin -Home Atorvastatin  -Hold home BB d/t cocaine use -consider restarting home meds today  HTN Home meds include: amlodipine 10 mg, carbedilol  12.5 mg BID and clonidine 0.1 mg. -hydralazine IV PRN for SBP >180 or DBP> 110 -Not on ACE/ARB given h/o angioedema    Diabetes Mellitus, insulin dependent  A1c 7.6 (8/5). BG so far 165-175. -moderate SSI  -CBGs AC QHS  Cocaine use/history of drug use: UDS positive for cocaine. Previous heroine use, quit 6 years ago.  -Counsel on discontinuing drug use   -avoid BB  Chronic hyponatremia: Na 128 today, baseline around 130-135. Likely slightly lower 2/2 hypervolemic state currently.  -monitor BMP in the am   CKD III:  BL Cr: 1.8. On admission: 1.46>1.67.  -BMP in the am   Dysuria: patient endorses dysuria at the end of his stream for the past week.  -UA pending  GERD - stable No sxs presently -Continue home omeprazole 20mg   Alcohol use: Pt reports drinking one 40 oz beer every other day. No history of withdrawal, seizures, or hospitalizations for alcoholism. CIWAs 0 overnight. -CIWA scoring; Ativan PRN  -Thiamine/folic acid   Tobacco use: 1/2 ppd -Nicotine patch 14mg    FENGI: Heart healthy, IV in place  Prophylaxis: Heparin    Disposition: MedSurg  Subjective:  Patient was resting related to the room.  He had been lying flat while sleeping and reported no trouble breathing overnight.  Per patient report shortness of breath has significantly improved in the past 24 hours.  Patient continues to note that he is having chest pressure in the center of his chest does not radiate.  He reports that he has had issues with gastric reflux in the past but this does not feel like that.  Patient was informed that cardiology would be stopping by  to assess meds for a-flutter.  Objective: Temp:  [97.5 F (36.4 C)-98.7 F (37.1 C)] 98 F (36.7 C) (08/05 0422) Pulse Rate:  [49-129] 99 (08/05 0422) Resp:  [14-25] 18 (08/05 0422) BP: (135-174)/(91-132) 135/93 (08/05 0422) SpO2:  [95 %-100 %] 99 % (08/05 0422) Weight:  [178 lb 12.8 oz (81.1 kg)-190 lb (86.2 kg)] 179 lb 14.4 oz  (81.6 kg) (08/05 0422) Physical Exam: General: Alert and cooperative and appears to be in no acute distress HEENT: Neck non-tender without lymphadenopathy, masses or thyromegaly Cardio: Normal A1 and S2, no S3 or S4. RRR. No murmurs or rubs.   Pulm: Clear to auscultation bilaterally, no crackles, wheezing, or diminished breath sounds. Normal respiratory effort Abdomen: Bowel sounds normal. Abdomen soft and non-tender.  Extremities: Right sided edema on LE. Warm/ well perfused.  Strong radial and pedal pulses. Neuro: Cranial nerves grossly intact    Laboratory: Recent Labs  Lab 07/20/18 0826 07/25/18 1119 07/26/18 0541  WBC 5.1 6.0 6.5  HGB 12.9* 13.9 13.7  HCT 38.3* 40.9 39.5  PLT 187 206 208   Recent Labs  Lab 07/20/18 0826 07/25/18 1119  NA 129* 128*  K 4.9 4.0  CL 97* 95*  CO2 20* 20*  BUN 11 10  CREATININE 1.47* 1.46*  CALCIUM 8.9 8.9  GLUCOSE 173* 165*      Imaging/Diagnostic Tests: Dg Chest 2 View  Result Date: 07/25/2018 CLINICAL DATA:  Chest pain with shortness of breath. EXAM: CHEST - 2 VIEW COMPARISON:  Radiographs 07/20/2018 and 03/16/2018. FINDINGS: Stable cardiomegaly and mediastinal contours. There is chronic central airway and fissural thickening, but no overt pulmonary edema, confluent airspace opacity or significant pleural effusion. The costophrenic angles are incompletely visualized on the lateral projection. No acute osseous findings are seen. Telemetry leads overlie the chest. IMPRESSION: Similar appearance of the chest with cardiomegaly, central airway and fissural thickening. No overt pulmonary edema. Electronically Signed   By: Richardean Sale M.D.   On: 07/25/2018 12:15   Dg Chest 2 View  Result Date: 07/20/2018 CLINICAL DATA:  Shortness of breath for 3 days. EXAM: CHEST - 2 VIEW COMPARISON:  PA and lateral chest 03/16/2018. CT chest and PA and lateral chest 12/16/2017. FINDINGS: There is cardiomegaly without pulmonary edema. No consolidative  process, pneumothorax or effusion. Aortic atherosclerosis is noted. No focal bony abnormality. IMPRESSION: No acute disease. Cardiomegaly. Atherosclerosis. Electronically Signed   By: Inge Rise M.D.   On: 07/20/2018 09:15     Matilde Haymaker, MD 07/26/2018, 6:32 AM PGY-1, [place] Intern pager: [phone], text pages welcome

## 2018-07-26 NOTE — Progress Notes (Signed)
Troponin 0.07 earlier was 0.07, no s/s, MD notified and aware, will continue to monitor, Thanks Arvella Nigh RN.

## 2018-07-26 NOTE — Progress Notes (Signed)
Patient resting in bed and calls desk with c/o shortness of breath. Upon checking vitals his 12 sat on room air was 99. B/P 109/64.  Patient states he feels pressure to center of chest.

## 2018-07-26 NOTE — Progress Notes (Signed)
MD , Family medicine notified of patient c/o shortness of breath and center chest pressure. MD states there will not be any changes in pt plan of care at this time.
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
## 2018-07-27 ENCOUNTER — Inpatient Hospital Stay (HOSPITAL_COMMUNITY): Payer: Medicaid Other

## 2018-07-27 DIAGNOSIS — R0602 Shortness of breath: Secondary | ICD-10-CM

## 2018-07-27 DIAGNOSIS — I361 Nonrheumatic tricuspid (valve) insufficiency: Secondary | ICD-10-CM

## 2018-07-27 LAB — CBC
HCT: 37.2 % — ABNORMAL LOW (ref 39.0–52.0)
Hemoglobin: 13.2 g/dL (ref 13.0–17.0)
MCH: 33.3 pg (ref 26.0–34.0)
MCHC: 35.5 g/dL (ref 30.0–36.0)
MCV: 93.9 fL (ref 78.0–100.0)
Platelets: 175 10*3/uL (ref 150–400)
RBC: 3.96 MIL/uL — ABNORMAL LOW (ref 4.22–5.81)
RDW: 12.3 % (ref 11.5–15.5)
WBC: 4.9 10*3/uL (ref 4.0–10.5)

## 2018-07-27 LAB — ECHOCARDIOGRAM COMPLETE
Height: 71 in
Weight: 2838.4 oz

## 2018-07-27 LAB — BASIC METABOLIC PANEL
Anion gap: 13 (ref 5–15)
BUN: 14 mg/dL (ref 6–20)
CO2: 25 mmol/L (ref 22–32)
Calcium: 8.8 mg/dL — ABNORMAL LOW (ref 8.9–10.3)
Chloride: 97 mmol/L — ABNORMAL LOW (ref 98–111)
Creatinine, Ser: 1.54 mg/dL — ABNORMAL HIGH (ref 0.61–1.24)
GFR calc Af Amer: 56 mL/min — ABNORMAL LOW (ref >60)
GFR calc non Af Amer: 48 mL/min — ABNORMAL LOW (ref >60)
Glucose, Bld: 163 mg/dL — ABNORMAL HIGH (ref 70–99)
Potassium: 3.2 mmol/L — ABNORMAL LOW (ref 3.5–5.1)
Sodium: 135 mmol/L (ref 135–145)

## 2018-07-27 LAB — GLUCOSE, CAPILLARY
Glucose-Capillary: 177 mg/dL — ABNORMAL HIGH (ref 70–99)
Glucose-Capillary: 210 mg/dL — ABNORMAL HIGH (ref 70–99)
Glucose-Capillary: 260 mg/dL — ABNORMAL HIGH (ref 70–99)

## 2018-07-27 MED ORDER — GLUCOSE BLOOD VI STRP: ORAL_STRIP | 12 refills | Status: DC

## 2018-07-27 MED ORDER — POTASSIUM CHLORIDE CRYS ER 20 MEQ PO TBCR
40.0000 meq | EXTENDED_RELEASE_TABLET | Freq: Once | ORAL | Status: AC
  Administered 2018-07-27: 40 meq via ORAL
  Filled 2018-07-27: qty 2

## 2018-07-27 MED ORDER — APIXABAN 5 MG PO TABS: 5.0000 mg | ORAL_TABLET | Freq: Two times a day (BID) | ORAL | 0 refills | Status: DC

## 2018-07-27 MED ORDER — CARVEDILOL 6.25 MG PO TABS
6.2500 mg | ORAL_TABLET | Freq: Two times a day (BID) | ORAL | Status: DC
  Administered 2018-07-27: 6.25 mg via ORAL
  Filled 2018-07-27: qty 1

## 2018-07-27 MED ORDER — POTASSIUM CHLORIDE CRYS ER 20 MEQ PO TBCR: 20.0000 meq | EXTENDED_RELEASE_TABLET | Freq: Every day | ORAL | Status: DC

## 2018-07-27 MED ORDER — GI COCKTAIL ~~LOC~~
30.0000 mL | Freq: Once | ORAL | Status: AC
  Administered 2018-07-27: 30 mL via ORAL
  Filled 2018-07-27: qty 30

## 2018-07-27 MED ORDER — FUROSEMIDE 20 MG PO TABS: 20.0000 mg | ORAL_TABLET | Freq: Every day | ORAL | Status: DC

## 2018-07-27 MED ORDER — CARVEDILOL 6.25 MG PO TABS: 6.2500 mg | ORAL_TABLET | Freq: Two times a day (BID) | ORAL | 0 refills | Status: DC

## 2018-07-27 MED ORDER — FUROSEMIDE 20 MG PO TABS: 20.0000 mg | ORAL_TABLET | Freq: Every day | ORAL | 3 refills | Status: DC

## 2018-07-27 NOTE — Plan of Care (Signed)
  Problem: Education: Goal: Knowledge of disease or condition will improve Outcome: Progressing Goal: Understanding of medication regimen will improve Outcome: Progressing   Problem: Education: Goal: Knowledge of General Education information will improve Description Including pain rating scale, medication(s)/side effects and non-pharmacologic comfort measures Outcome: Progressing   Problem: Health Behavior/Discharge Planning: Goal: Ability to manage health-related needs will improve Outcome: Progressing   Problem: Clinical Measurements: Goal: Respiratory complications will improve Outcome: Progressing

## 2018-07-27 NOTE — Progress Notes (Signed)
Paged doctor [facility] family medicine, for clarification regarding discharged orders on lasix and concerns.

## 2018-07-27 NOTE — Discharge Instructions (Addendum)
The following medication changes have been made: -Discontinue your clonidine -I have reduced your dose of Coreg to 6.25 mg BID.  Please follow up with your primary doctor to check when this should be resumed at your regular dose.   Make an appointment with your primary doctor within one week.    Please read and follow all provided instructions.  Your diagnoses today include:  1. Shortness of breath   2. Acute on chronic combined systolic and diastolic heart failure (HCC)     Tests performed today include:  An EKG of your heart  A chest x-ray  Cardiac enzymes - a blood test for heart muscle damage  Blood counts and electrolytes  Vital signs. See below for your results today.   Medications prescribed:   Take any prescribed medications only as directed.  Follow-up instructions: Please follow-up with your primary care provider as soon as you can for further evaluation of your symptoms.   Return instructions:  SEEK IMMEDIATE MEDICAL ATTENTION IF:  You have severe chest pain, especially if the pain is crushing or pressure-like and spreads to the arms, back, neck, or jaw, or if you have sweating, nausea (feeling sick to your stomach), or shortness of breath. THIS IS AN EMERGENCY. Don't wait to see if the pain will go away. Get medical help at once. Call 911 or 0 (operator). DO NOT drive yourself to the hospital.   Your chest pain gets worse and does not go away with rest.   You have an attack of chest pain lasting longer than usual, despite rest and treatment with the medications your caregiver has prescribed.   You wake from sleep with chest pain or shortness of breath.  You feel dizzy or faint.  You have chest pain not typical of your usual pain for which you originally saw your caregiver.   You have any other emergent concerns regarding your health.  Additional Information: Chest pain comes from many different causes. Your caregiver has diagnosed you as having chest  pain that is not specific for one problem, but does not require admission.  You are at low risk for an acute heart condition or other serious illness.   Your vital signs today were: BP (!) 157/104    Pulse 99    Temp 98.7 F (37.1 C) (Oral)    Resp 14    Ht 5\' 11"  (1.803 m)    Wt 86.2 kg (190 lb)    SpO2 97%    BMI 26.50 kg/m  If your blood pressure (BP) was elevated above 135/85 this visit, please have this repeated by your doctor within one month. --------------     Information on my medicine - ELIQUIS (apixaban)  Why was Eliquis prescribed for you? Eliquis was prescribed for you to reduce the risk of a blood clot forming that can cause a stroke if you have a medical condition called atrial fibrillation (a type of irregular heartbeat).  What do You need to know about Eliquis ? Take your Eliquis TWICE DAILY - one tablet in the morning and one tablet in the evening with or without food. If you have difficulty swallowing the tablet whole please discuss with your pharmacist how to take the medication safely.  Take Eliquis exactly as prescribed by your doctor and DO NOT stop taking Eliquis without talking to the doctor who prescribed the medication.  Stopping may increase your risk of developing a stroke.  Refill your prescription before you run out.  After discharge, you  should have regular check-up appointments with your healthcare provider that is prescribing your Eliquis.  In the future your dose may need to be changed if your kidney function or weight changes by a significant amount or as you get older.  What do you do if you miss a dose? If you miss a dose, take it as soon as you remember on the same day and resume taking twice daily.  Do not take more than one dose of ELIQUIS at the same time to make up a missed dose.  Important Safety Information A possible side effect of Eliquis is bleeding. You should call your healthcare provider right away if you experience any of the  following: ? Bleeding from an injury or your nose that does not stop. ? Unusual colored urine (red or dark brown) or unusual colored stools (red or black). ? Unusual bruising for unknown reasons. ? A serious fall or if you hit your head (even if there is no bleeding).  Some medicines may interact with Eliquis and might increase your risk of bleeding or clotting while on Eliquis. To help avoid this, consult your healthcare provider or pharmacist prior to using any new prescription or non-prescription medications, including herbals, vitamins, non-steroidal anti-inflammatory drugs (NSAIDs) and supplements.  This website has more information on Eliquis (apixaban): http://www.eliquis.com/eliquis/home

## 2018-07-27 NOTE — Progress Notes (Signed)
Pharmacist Heart Failure Core Measure Documentation  Assessment: Brad [name] has an EF documented as 20-25% on 07/27/18 by Echo.  Rationale: Heart failure patients with left ventricular systolic dysfunction (LVSD) and an EF < 40% should be prescribed an angiotensin converting enzyme inhibitor (ACEI) or angiotensin receptor blocker (ARB) at discharge unless a contraindication is documented in the medical record.  This patient is not currently on an ACEI or ARB for HF.  This note is being placed in the record in order to provide documentation that a contraindication to the use of these agents is present for this encounter.  ACE Inhibitor or Angiotensin Receptor Blocker is contraindicated (specify all that apply)  [x]   ACEI allergy AND ARB allergy []   Angioedema []   Moderate or severe aortic stenosis []   Hyperkalemia []   Hypotension []   Renal artery stenosis [x]   Worsening renal function, preexisting renal disease or dysfunction   Brad [name], PharmD, BCPS Clinical Staff Pharmacist Pager [phone]  Brad [name] 07/27/2018 1:34 PM

## 2018-07-27 NOTE — Progress Notes (Signed)
Family Medicine Teaching Service Daily Progress Note Intern Pager: [phone]  Patient name: Ishaq Maffei Medical record number: [id number] Date of birth: [birth year] Age: 57 y.o. Gender: male  Primary Care Provider: Charlott Rakes, MD Consultants: cardiology Code Status: limited, DNI, confirmed with pt on admission  Pt Overview and Major Events to Date:  8/4 - admitted for CHF  Assessment and Plan: Elias Dennington is a 57 y.o. male presenting with 1 day history of central non-radiating chest pressure and DOE and w/ rest. Past history includes hypertension, chronic combined systolic and diastolic heart failure with EF 35-40%, tobacco use, insulin dependent diabetes mellitus, recent cocaine use 2-4 days ago, GERD, cirrhosis due to hepatitis C, history of heroin use disorder (states he is sober) and CKD III.   Aflutter Patient noted another episode of chest tightness and shortness of breath overnight that he was able to go to sleep to about half an hour.  Patient is aware that sensation in his chest is likely related to his a flutter.  No acute events on telemetry overnight. - DC amio, do not want to convert to sinus rhythm if thrombus present - Start Eliquis 5 mg daily - if AF is persistent, anticoagulate for 3-4 week then outpatient cardioversion if symptomatic  Chest pain w/ DOE Likely related to [place]. See above for treatment plan -GI cocktail given 8/6 -f/u Echocardiogram   Combined systolic/diastolic heart failure: Not fluid overloaded on exam today (8/5). Pt does not appear to be in ADHF. Home meds: amlodipine, carvedilol, clonidine, atorvastatin -Home Atorvastatin  -Hold home BB d/t cocaine use -restarted amlodipine 8/5 -restart carvedilol today at 6.25 mg -restart home lasix   HTN BP well controlled overnight(8/6). home meds include: amlodipine 10 mg, carbedilol 12.5 mg BID and clonidine 0.1 mg. -hydralazine IV PRN for SBP >180 or DBP> 110 -continue amlodipine -DC  clonidine due to potential for AE  Diabetes Mellitus, insulin dependent  A1c 7.6 (8/5). BG well controlled overnight (8/6). -moderate SSI  -CBGs AC QHS  Cocaine use/history of drug use: UDS positive for cocaine. Previous heroine use, quit 6 years ago.  -Counsel on discontinuing drug use   -avoid BB  Chronic hyponatremia: baseline around 130-135. 135 (8/6) -monitor BMP in the am   CKD III:  BL Cr: 1.8. On admission: 1.46>1.67>1.54.  -Daily BMP  Dysuria: patient endorses dysuria at the end of his stream for the past week.  -UA pending  GERD - stable No sxs presently -Continue home omeprazole 20mg   Alcohol use: Pt reports drinking one 40 oz beer every other day. No history of withdrawal, seizures, or hospitalizations for alcoholism. CIWAs 0 overnight. -CIWA scoring; Ativan PRN  -Thiamine/folic acid   Tobacco use: 1/2 ppd -Nicotine patch 14mg    FENGI: Heart healthy, IV in place  Prophylaxis: Heparin   Disposition: MedSurg  Subjective:  Patient had no new complaints this morning he did report another episode of chest tightness and shortness of breath overnight.  Patient went to know when he would be having his heart ultrasound done was informed that should be done today.  Was informed that he might be leaving today or tomorrow.  Objective: Temp:  [98 F (36.7 C)-98.4 F (36.9 C)] 98 F (36.7 C) (08/06 0531) Pulse Rate:  [82-100] 99 (08/06 0509) Resp:  [18] 18 (08/06 0509) BP: (127-140)/(74-96) 136/88 (08/06 0509) SpO2:  [96 %-100 %] 96 % (08/06 0509) Physical Exam: General: Alert and cooperative and appears to be in no acute distress HEENT: Neck non-tender without lymphadenopathy,  masses or thyromegaly Cardio: Normal A1 and S2, no S3 or S4. Irregularly irregular rhythm. No murmurs or rubs.   Pulm: Clear to auscultation bilaterally, no crackles, wheezing, or diminished breath sounds. Normal respiratory effort Abdomen: Bowel sounds normal. Abdomen soft and  non-tender.  Extremities: No peripheral edema. Warm/ well perfused.  Strong radial pulses. Neuro: Cranial nerves grossly intact    Laboratory: Recent Labs  Lab 07/20/18 0826 07/25/18 1119 07/26/18 0541  WBC 5.1 6.0 6.5  HGB 12.9* 13.9 13.7  HCT 38.3* 40.9 39.5  PLT 187 206 208   Recent Labs  Lab 07/20/18 0826 07/25/18 1119 07/26/18 0541  NA 129* 128* 132*  K 4.9 4.0 3.9  CL 97* 95* 95*  CO2 20* 20* 23  BUN 11 10 15   CREATININE 1.47* 1.46* 1.67*  CALCIUM 8.9 8.9 9.2  PROT  --   --  8.0  BILITOT  --   --  1.6*  ALKPHOS  --   --  61  ALT  --   --  14  AST  --   --  23  GLUCOSE 173* 165* 195*      Imaging/Diagnostic Tests: Dg Chest 2 View  Result Date: 07/25/2018 CLINICAL DATA:  Chest pain with shortness of breath. EXAM: CHEST - 2 VIEW COMPARISON:  Radiographs 07/20/2018 and 03/16/2018. FINDINGS: Stable cardiomegaly and mediastinal contours. There is chronic central airway and fissural thickening, but no overt pulmonary edema, confluent airspace opacity or significant pleural effusion. The costophrenic angles are incompletely visualized on the lateral projection. No acute osseous findings are seen. Telemetry leads overlie the chest. IMPRESSION: Similar appearance of the chest with cardiomegaly, central airway and fissural thickening. No overt pulmonary edema. Electronically Signed   By: Richardean Sale M.D.   On: 07/25/2018 12:15   Dg Chest 2 View  Result Date: 07/20/2018 CLINICAL DATA:  Shortness of breath for 3 days. EXAM: CHEST - 2 VIEW COMPARISON:  PA and lateral chest 03/16/2018. CT chest and PA and lateral chest 12/16/2017. FINDINGS: There is cardiomegaly without pulmonary edema. No consolidative process, pneumothorax or effusion. Aortic atherosclerosis is noted. No focal bony abnormality. IMPRESSION: No acute disease. Cardiomegaly. Atherosclerosis. Electronically Signed   By: Inge Rise M.D.   On: 07/20/2018 09:15     Matilde Haymaker, MD 07/27/2018, 6:03  AM PGY-1, [place] Intern pager: [phone], text pages welcome

## 2018-07-27 NOTE — Progress Notes (Signed)
Progress Note  Patient Name: Brad [name] Date of Encounter: 07/27/2018  Primary Cardiologist:    [facility]   Subjective   57 year old gentleman with a history of hypertension, atrial fibrillation/ flutter , cardiomyopathy with a long history of IV drug abuse and cocaine abuse.  He also has a history of hepatitis C and CKD.  He presented to the hospital with recurrent atrial flutter.  He had used cocaine several days prior.  Troponin levels were minimally elevated with a flat trend not consistent with an acute coronary syndrome.  He also has had poorly controlled hypertension.  He had acute on chronic combined systolic and diastolic congestive heart failure.  His intake and output suggest that he is net +360 cc.  It is likely that he has not kept up with all of his urine.  He stated that he did urinate quite a bit but was not told until last night that he needed to save his urine. Weight is 177 pounds today.  Echocardiogram to be done today. Pressure is much better controlled today.  Inpatient Medications    Scheduled Meds: . amitriptyline  75 mg Oral QHS  . amLODipine  10 mg Oral Daily  . apixaban  5 mg Oral BID  . cloNIDine  0.1 mg Oral Daily  . folic acid  1 mg Oral Daily  . furosemide  40 mg Intravenous Daily  . gabapentin  700 mg Oral BID  . insulin aspart  0-15 Units Subcutaneous TID WC  . multivitamin with minerals  1 tablet Oral Daily  . nicotine  14 mg Transdermal Daily  . sodium chloride flush  3 mL Intravenous Q12H  . thiamine  100 mg Oral Daily   Or  . thiamine  100 mg Intravenous Daily   Continuous Infusions:  PRN Meds: acetaminophen, diclofenac sodium, hydrALAZINE, LORazepam **OR** LORazepam, nitroGLYCERIN   Vital Signs    Vitals:   07/26/18 1927 07/27/18 0008 07/27/18 0509 07/27/18 0531  BP: 127/81 128/74 136/88   Pulse: 82 85 99   Resp: 18 18 18    Temp: 98.4 F (36.9 C) 98.1 F (36.7 C)  98 F (36.7 C)  TempSrc: Oral Oral  Oral  SpO2: 99% 100%  96%   Weight:   177 lb 6.4 oz (80.5 kg)   Height:        Intake/Output Summary (Last 24 hours) at 07/27/2018 0909 Last data filed at 07/27/2018 0300 Gross per 24 hour  Intake 814.62 ml  Output 700 ml  Net 114.62 ml   Filed Weights   07/25/18 2049 07/26/18 0422 07/27/18 0509  Weight: 178 lb 12.8 oz (81.1 kg) 179 lb 14.4 oz (81.6 kg) 177 lb 6.4 oz (80.5 kg)    Telemetry    Atrial fibrillation with a controlled ventricular response- Personally Reviewed  ECG    Atrial for ablation/flutter with controlled ventricular response- Personally Reviewed  Physical Exam   GEN: No acute distress.   Middle-aged gentleman. Neck: No JVD Cardiac:  Irregularly irregular Respiratory: Clear to auscultation bilaterally. GI: Soft, nontender, non-distended  MS: No edema; No deformity. Neuro:  Nonfocal  Psych: Normal affect   Labs    Chemistry Recent Labs  Lab 07/25/18 1119 07/26/18 0541 07/27/18 0616  NA 128* 132* 135  K 4.0 3.9 3.2*  CL 95* 95* 97*  CO2 20* 23 25  GLUCOSE 165* 195* 163*  BUN 10 15 14   CREATININE 1.46* 1.67* 1.54*  CALCIUM 8.9 9.2 8.8*  PROT  --  8.0  --  ALBUMIN  --  3.2*  --   AST  --  23  --   ALT  --  14  --   ALKPHOS  --  61  --   BILITOT  --  1.6*  --   GFRNONAA 51* 44* 48*  GFRAA 59* 51* 56*  ANIONGAP 13 14 13      Hematology Recent Labs  Lab 07/25/18 1119 07/26/18 0541 07/27/18 0616  WBC 6.0 6.5 4.9  RBC 4.28 4.22 3.96*  HGB 13.9 13.7 13.2  HCT 40.9 39.5 37.2*  MCV 95.6 93.6 93.9  MCH 32.5 32.5 33.3  MCHC 34.0 34.7 35.5  RDW 12.4 12.3 12.3  PLT 206 208 175    Cardiac Enzymes Recent Labs  Lab 07/25/18 1853 07/25/18 2242 07/26/18 0541 07/26/18 1107  TROPONINI 0.07* 0.07* 0.06* 0.06*    Recent Labs  Lab 07/20/18 1130 07/25/18 1128 07/25/18 1445  TROPIPOC 0.04 0.10* 0.06     BNP Recent Labs  Lab 07/25/18 1119  BNP 713.0*     DDimer No results for input(s): DDIMER in the last 168 hours.   Radiology    Dg Chest 2  View  Result Date: 07/25/2018 CLINICAL DATA:  Chest pain with shortness of breath. EXAM: CHEST - 2 VIEW COMPARISON:  Radiographs 07/20/2018 and 03/16/2018. FINDINGS: Stable cardiomegaly and mediastinal contours. There is chronic central airway and fissural thickening, but no overt pulmonary edema, confluent airspace opacity or significant pleural effusion. The costophrenic angles are incompletely visualized on the lateral projection. No acute osseous findings are seen. Telemetry leads overlie the chest. IMPRESSION: Similar appearance of the chest with cardiomegaly, central airway and fissural thickening. No overt pulmonary edema. Electronically Signed   By: Richardean Sale M.D.   On: 07/25/2018 12:15    Cardiac Studies     Patient Profile     57 y.o. male with a history of acute on chronic combined heart failure, paroxysmal atrial flutter and nonischemic cardia myopathy.  Assessment & Plan    1.  Paroxysmal atrial for ablation/atrial flutter: The patient remains in atrial for ablation.  We have stopped his amiodarone.  He is on Eliquis 5 mg twice a day.  Plan is for Eliquis for 3 or 4 weeks with a follow-up with Dr. Debara Pickett or an APP.  We can schedule an elective cardioversion at that time.  2.  Acute on chronic combined heart failure: Patient has a repeat echocardiogram scheduled for today.  Continue diuresis. Creatinine is stable at 1.54.  Potassium is low.  We will supplement.  3.  Essential hypertension: His blood pressure is better controlled.  4.  Hyperlipidemia: Continue atorvastatin 40 mg a day.  For questions or updates, please contact [place] Please consult [email or web address] for contact info under Cardiology/STEMI.      Signed, Mertie Moores, MD  07/27/2018, 9:09 AM

## 2018-07-27 NOTE — Clinical Social Work Note (Signed)
Clinical Social Work Assessment  Patient Details  Name: Celvin Taney MRN: [id number] Date of Birth: 14-Apr-1961  Date of referral:  07/27/18               Reason for consult:  Substance Use/ETOH Abuse                Permission sought to share information with:    Permission granted to share information::  No  Name::        Agency::     Relationship::     Contact Information:     Housing/Transportation Living arrangements for the past 2 months:  Single Family Home Source of Information:  Patient, Medical Team Patient Interpreter Needed:  None Criminal Activity/Legal Involvement Pertinent to Current Situation/Hospitalization:  No - Comment as needed Significant Relationships:  Friend Lives with:    Do you feel safe going back to the place where you live?  Yes Need for family participation in patient care:  Yes (Comment)  Care giving concerns:  Substance abuse. Transportation home.   Social Worker assessment / plan:  CSW met with patient. RN's at bedside. CSW introduced role and inquired about interest in substance abuse treatment resources. Patient declined stating he had been here long enough and can stop on his own. CSW provided two bus passes so he can go to pharmacy and then home. No further concerns. CSW signing off as social work intervention is no longer needed.  Employment status:  Disabled (Comment on whether or not currently receiving Disability) Insurance information:  Medicaid [place] PT Recommendations:  Not assessed at this time Information / Referral to community resources:  Outpatient Substance Abuse Treatment Options, Residential Substance Abuse Treatment Options  Patient/Family's Response to care:  Patient refusing substance abuse resources. Patient's friends supportive and involved in patient's care. Patient appreciated social work intervention.  Patient/Family's Understanding of and Emotional Response to Diagnosis, Current Treatment, and Prognosis:  Patient  has a good understanding of the reason for admission and social work consult. Patient appears pleased with hospital care.  Emotional Assessment Appearance:  Appears stated age Attitude/Demeanor/Rapport:  Engaged Affect (typically observed):  Appropriate, Calm Orientation:  Oriented to Self, Oriented to Place, Oriented to  Time, Oriented to Situation Alcohol / Substance use:  Illicit Drugs Psych involvement (Current and /or in the community):  No (Comment)  Discharge Needs  Concerns to be addressed:  Care Coordination Readmission within the last 30 days:  No Current discharge risk:  None Barriers to Discharge:  No Barriers Identified   Candie Chroman, LCSW 07/27/2018, 4:08 PM

## 2018-07-27 NOTE — Progress Notes (Signed)
Patient discharged. Patient denied any further needs and states that concerns have been cleared up.  Patient's transportation needs were addressed.   Printed education was given.

## 2018-07-29 IMAGING — DX DG CHEST 2V
2 series · 2 of 2 positions shown · non-contrast
Comparison: 07/08/2017

CLINICAL DATA: Right-sided chest pain with dyspnea.

EXAM:
CHEST  2 VIEW

[chest pa]
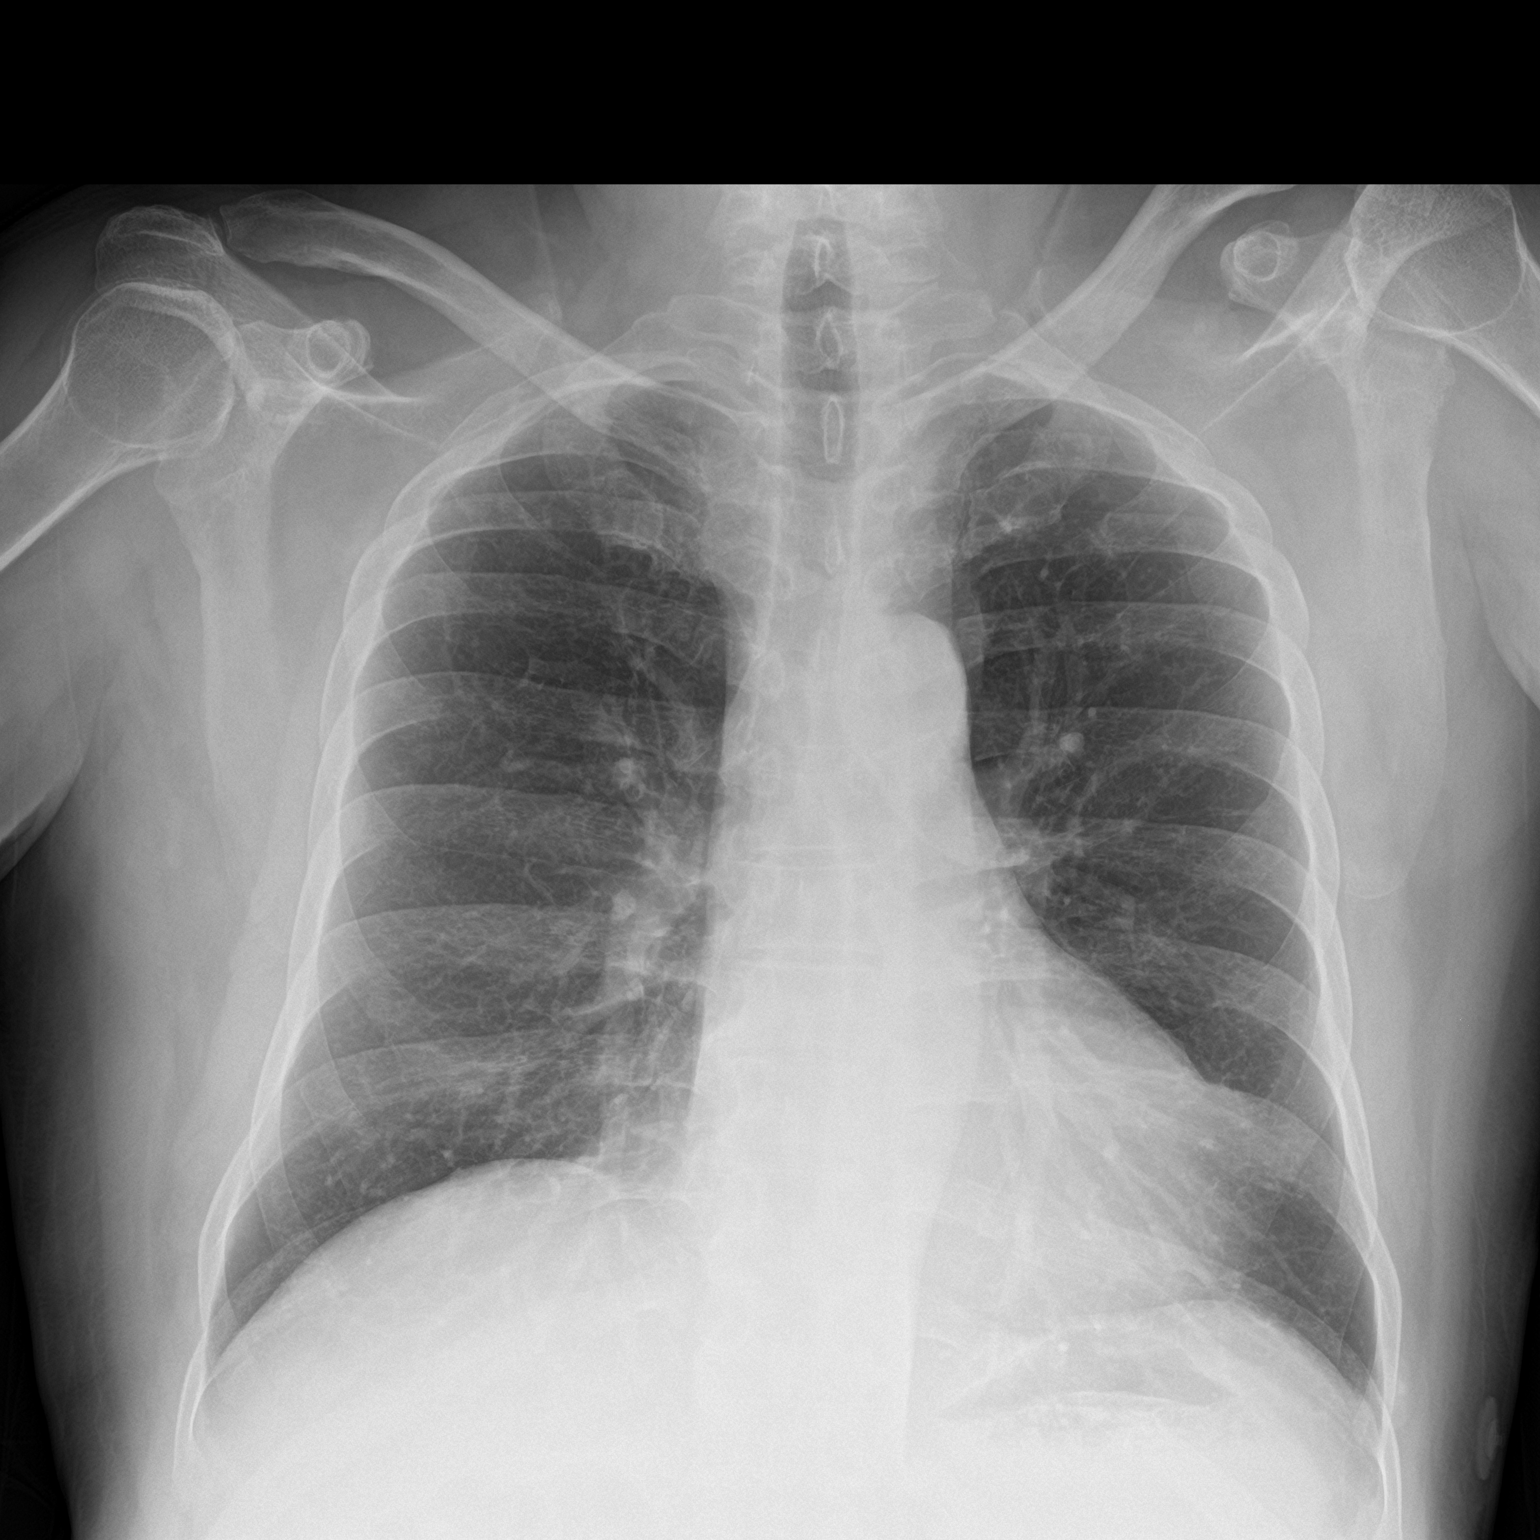

[chest lat]
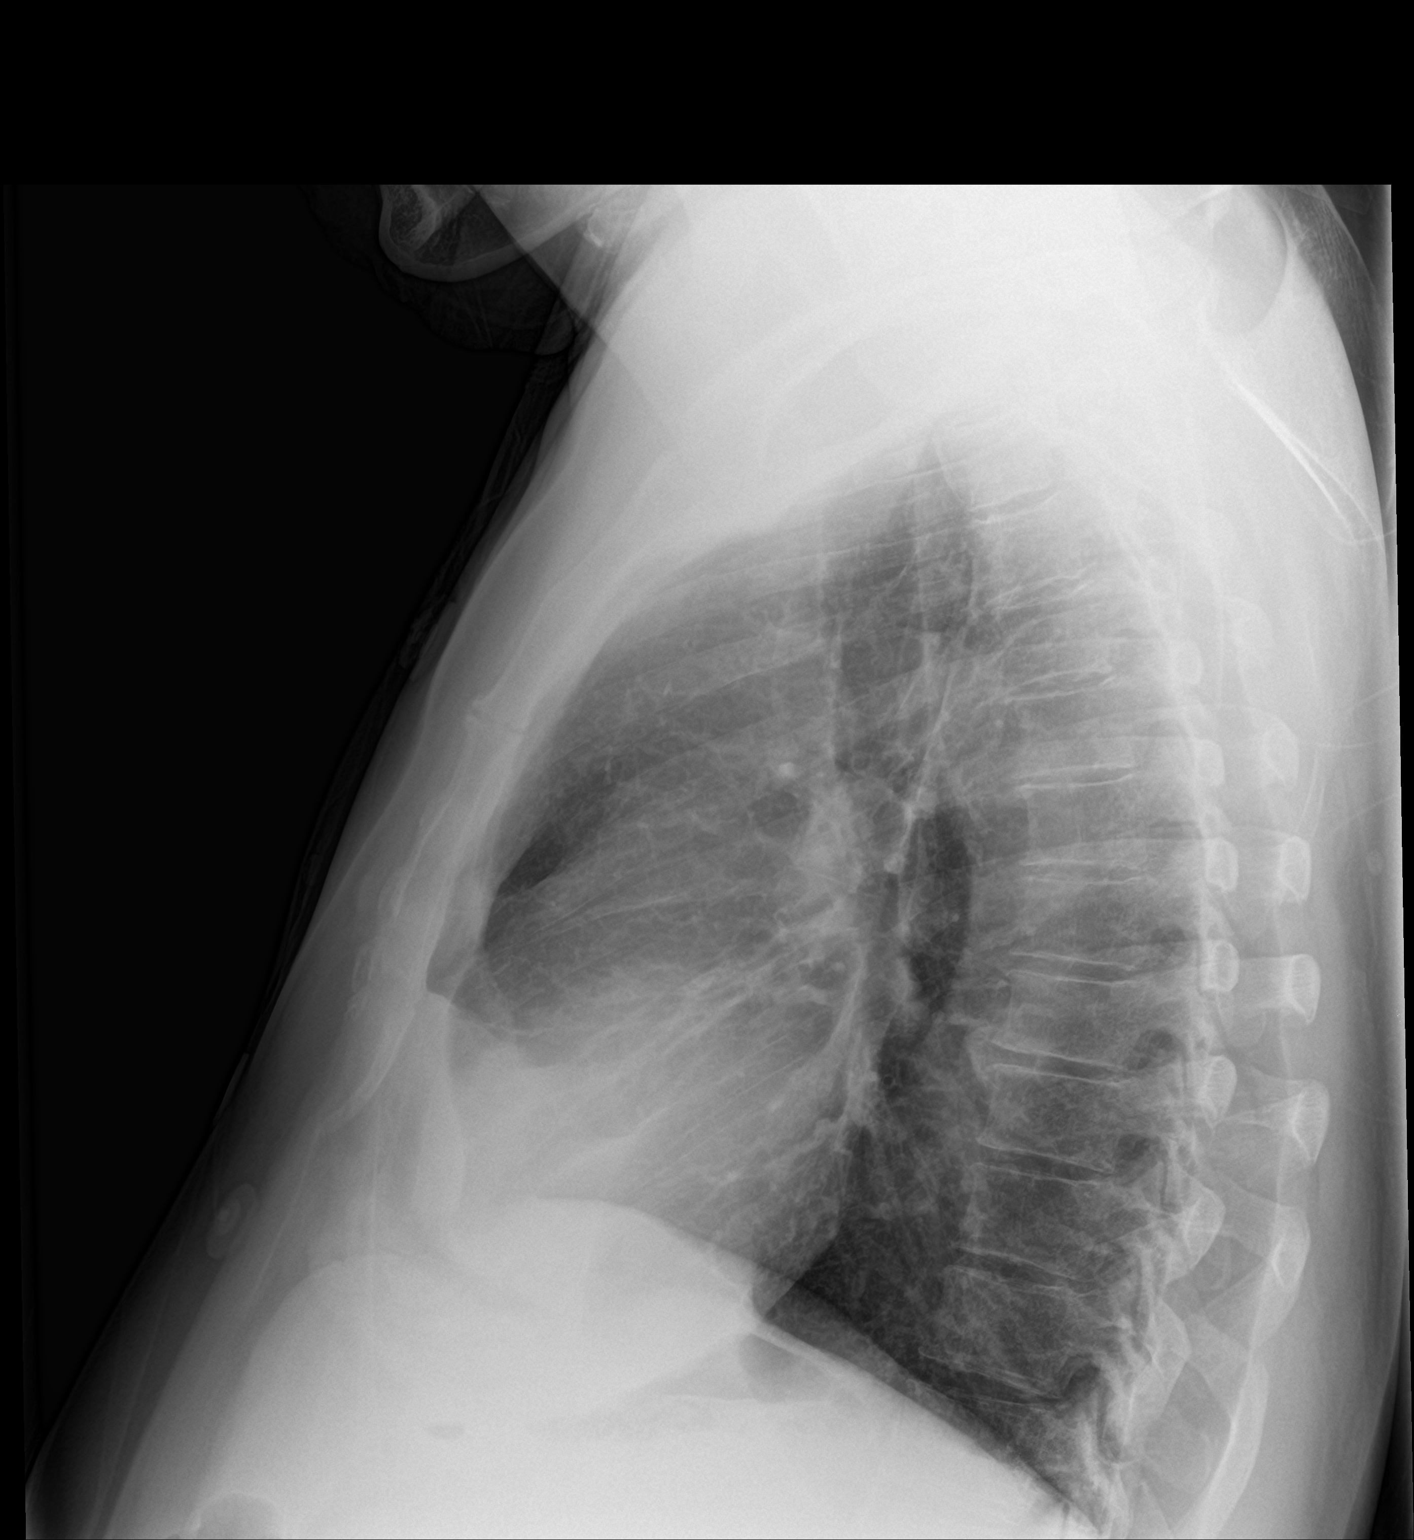

[2 of 2 positions shown; findings below may reference images not displayed]

FINDINGS: The heart size and mediastinal contours are within normal limits.
Both lungs are clear. The visualized skeletal structures are
unremarkable.
IMPRESSION: No active cardiopulmonary disease.

## 2018-07-30 ENCOUNTER — Ambulatory Visit: Payer: Medicaid Other | Admitting: Internal Medicine

## 2018-08-02 ENCOUNTER — Encounter: Payer: Self-pay | Admitting: *Deleted

## 2018-08-08 IMAGING — CR DG CHEST 2V
2 series · 2 of 2 positions shown · non-contrast
Comparison: Radiographs July 26, 2017.

CLINICAL DATA: Chest pain, shortness of breath.

EXAM:
CHEST  2 VIEW

[chest ap]
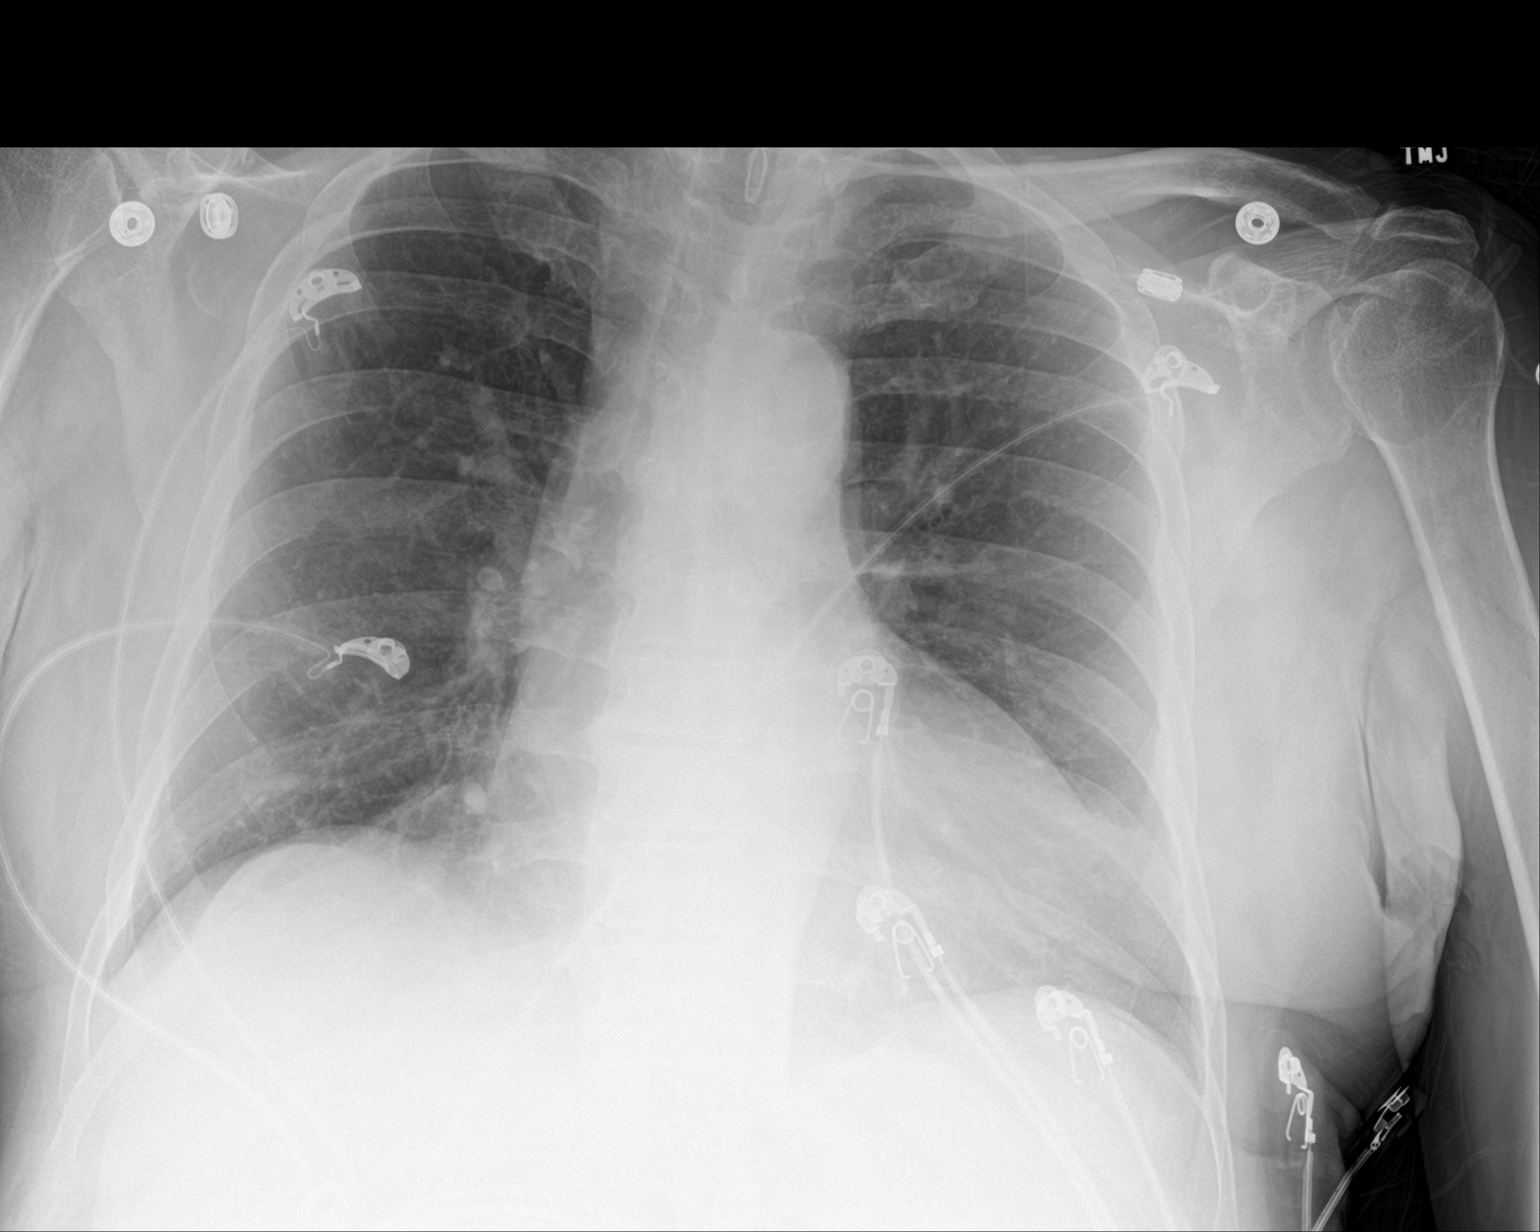

[chest lat]
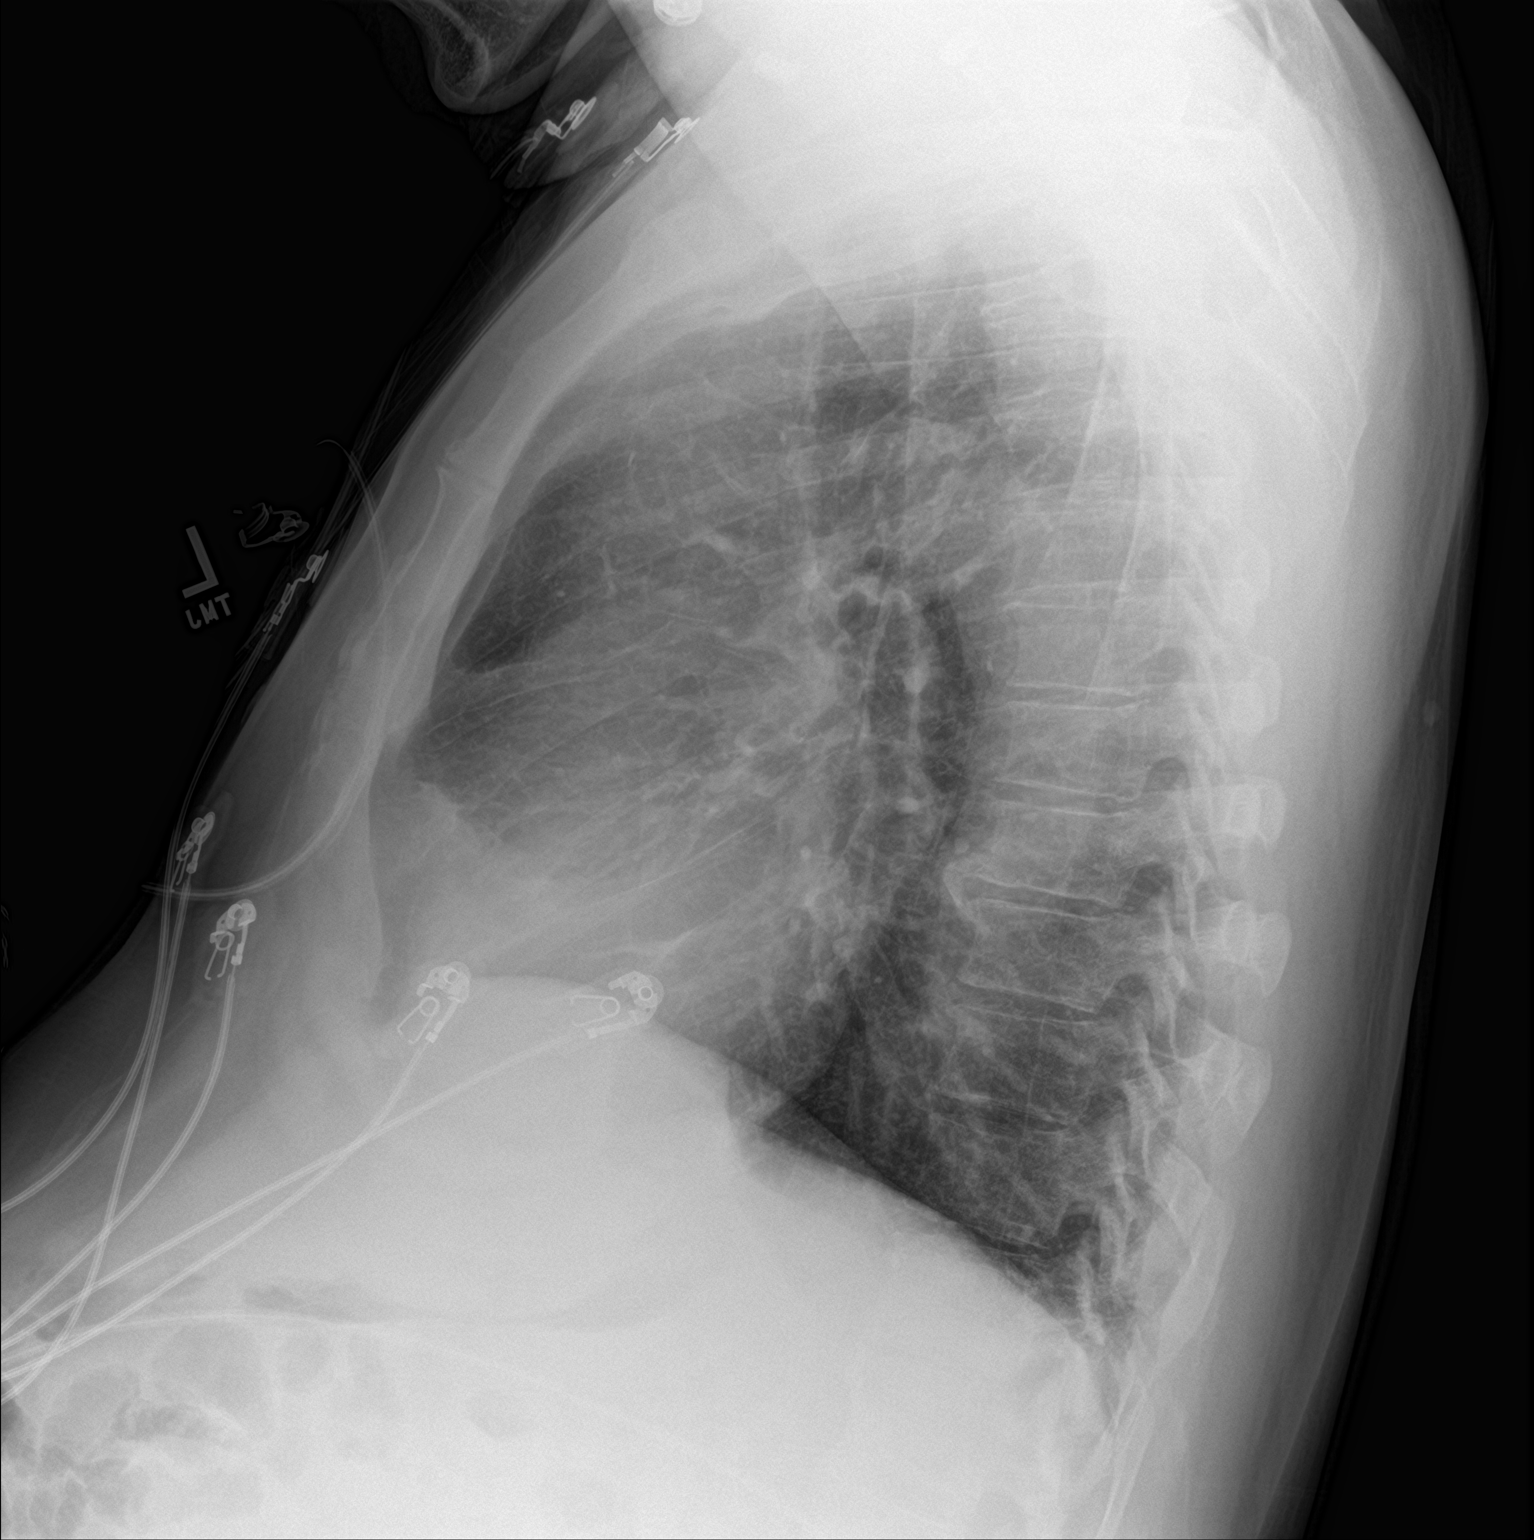

[2 of 2 positions shown; findings below may reference images not displayed]

FINDINGS: The heart size and mediastinal contours are within normal limits.
Both lungs are clear. No pneumothorax or pleural effusion is noted.
The visualized skeletal structures are unremarkable.
IMPRESSION: No active cardiopulmonary disease.

## 2018-08-08 IMAGING — CT CT ANGIO CHEST
2 of 6 series · 18 of 36 positions shown · IV contrast (isovue)
Comparison: Chest CT angiogram July 08, 2017; chest radiograph
August 05, 2017

CLINICAL DATA: Chest pain and shortness of Breath

EXAM:
CT ANGIOGRAPHY CHEST WITH CONTRAST
TECHNIQUE: Multidetector CT imaging of the chest was performed using the
standard protocol during bolus administration of intravenous
contrast. Multiplanar CT image reconstructions and MIPs were
obtained to evaluate the vascular anatomy.
CONTRAST:  80 mL Isovue 370 nonionic

[Series 6: pe thins · axial · 0.85mm/px · z∈[+1316,+1545]mm · 17 of 361 slices shown]
[im 17/361  lung]
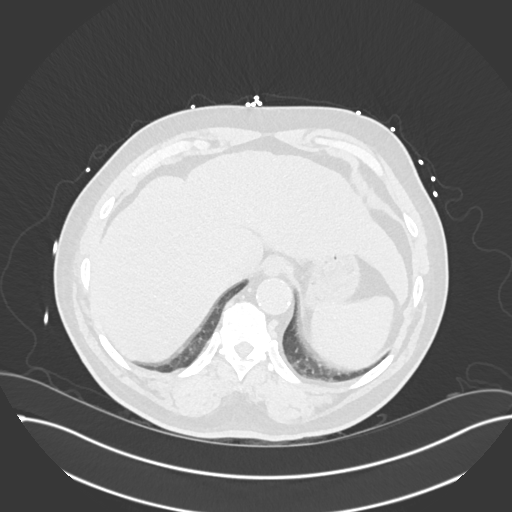
[im 33/361  mediastinal]
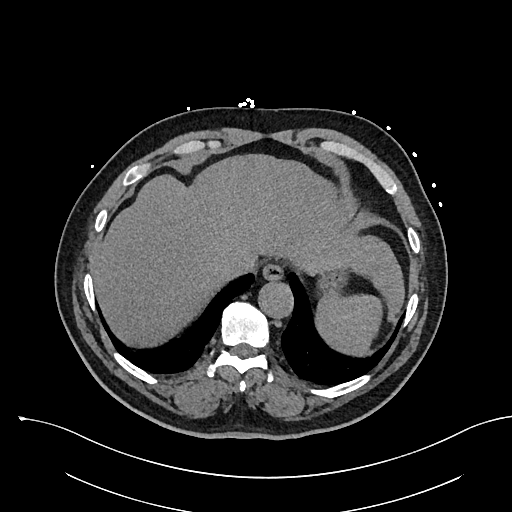
[im 66/361  lung]
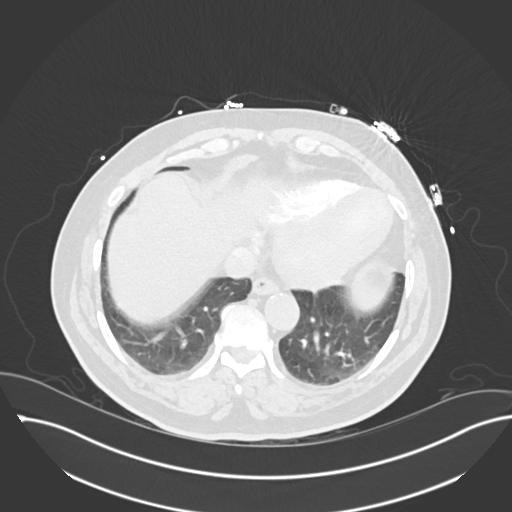
[im 82/361  mediastinal]
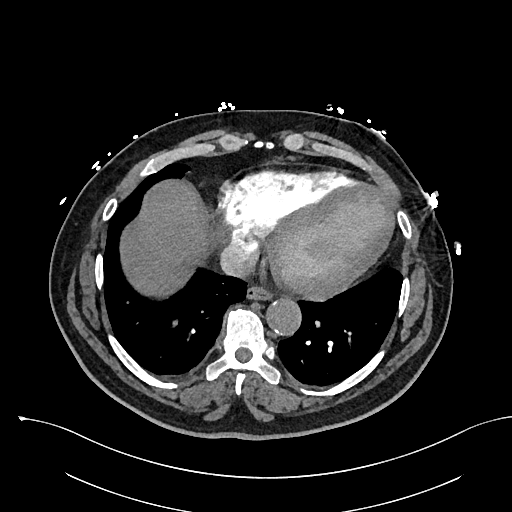
[im 99/361  lung]
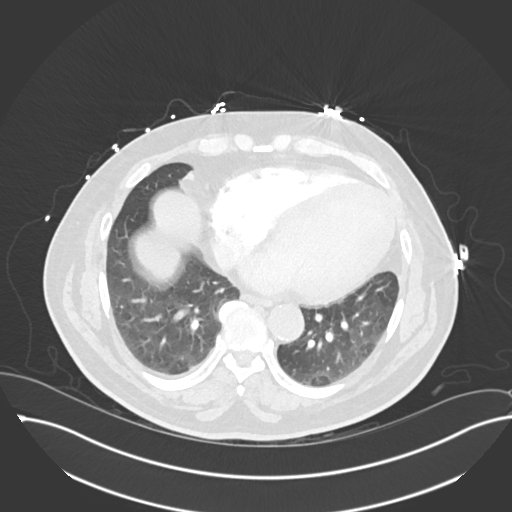
[im 115/361  mediastinal]
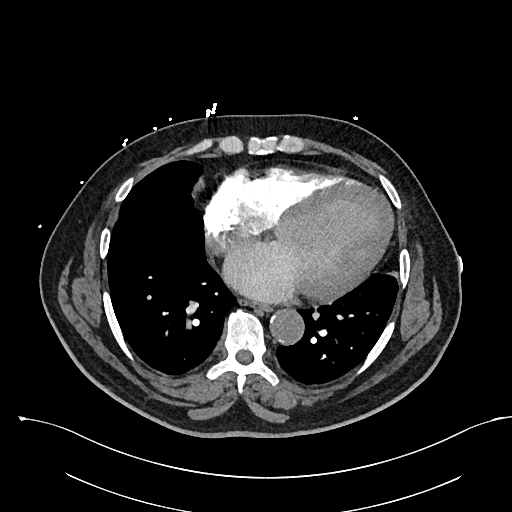
[im 148/361  lung]
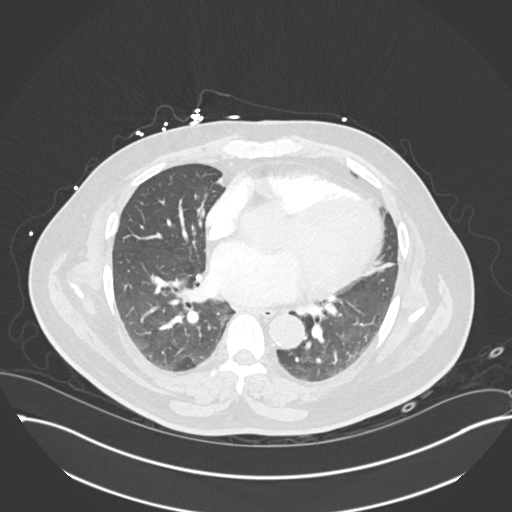
[im 164/361  mediastinal]
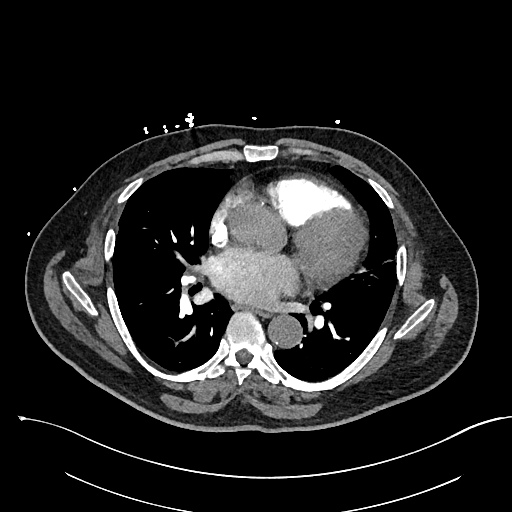
[im 181/361  lung]
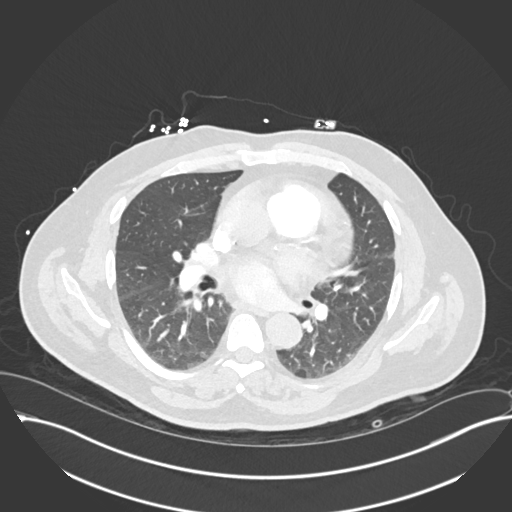
[im 197/361  mediastinal]
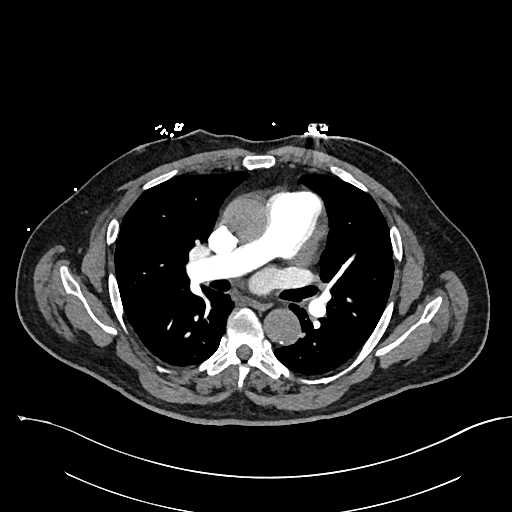
[im 213/361  lung]
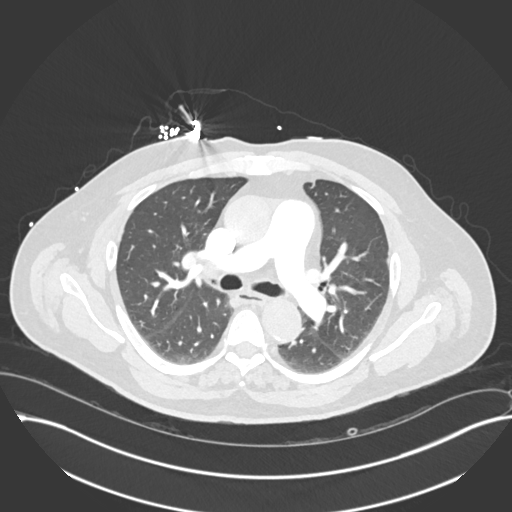
[im 246/361  mediastinal]
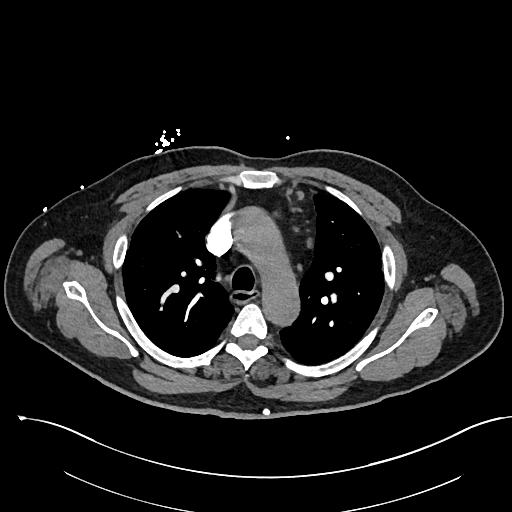
[im 262/361  lung]
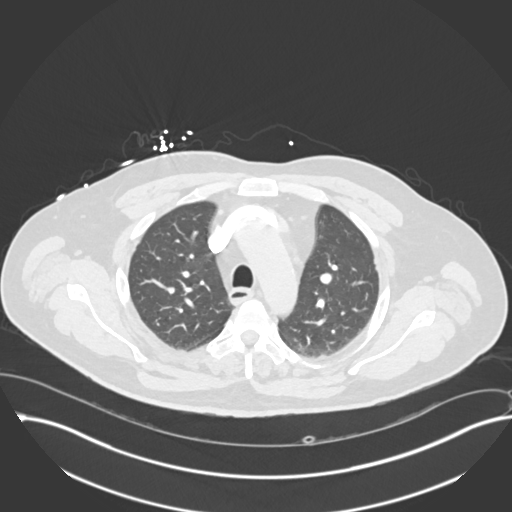
[im 279/361  mediastinal]
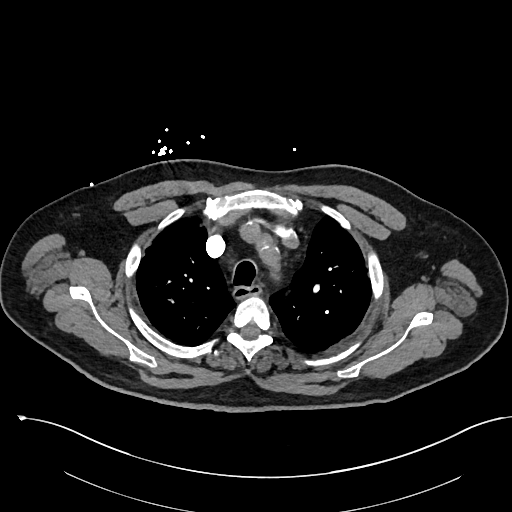
[im 295/361  lung]
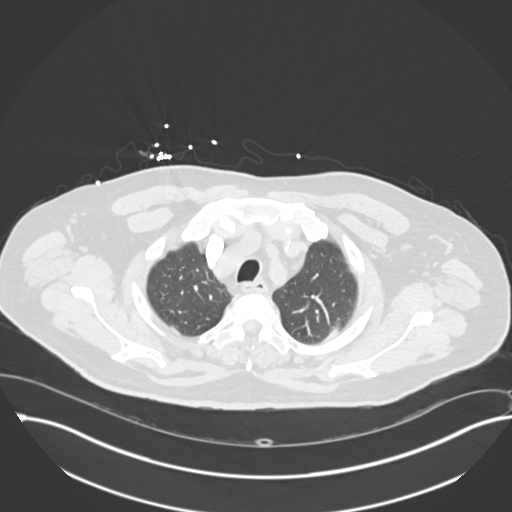
[im 328/361  mediastinal]
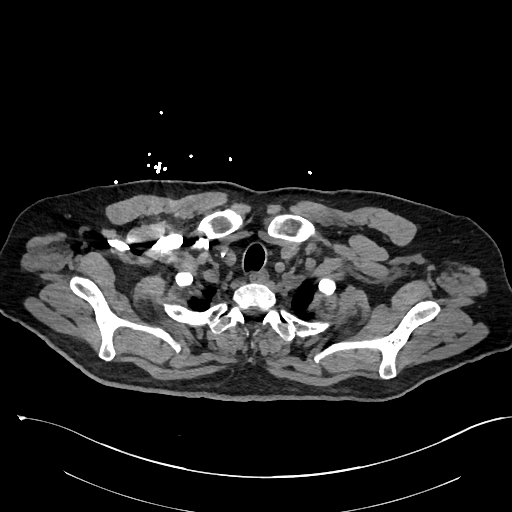
[im 344/361  lung]
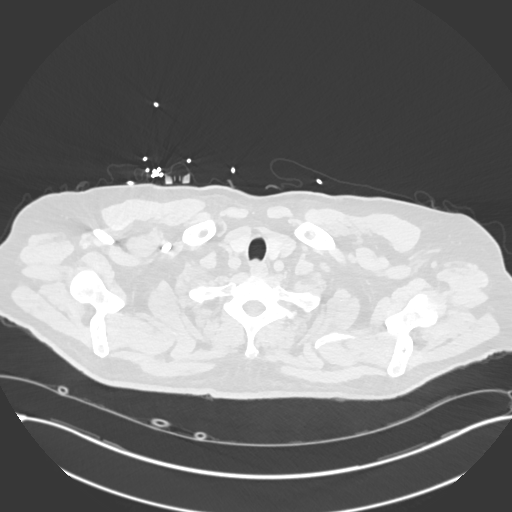

[Series 7: pe 2mm cor · coronal · 0.58mm/px · 1 of 101 slices shown]
[im 51/101  mediastinal]
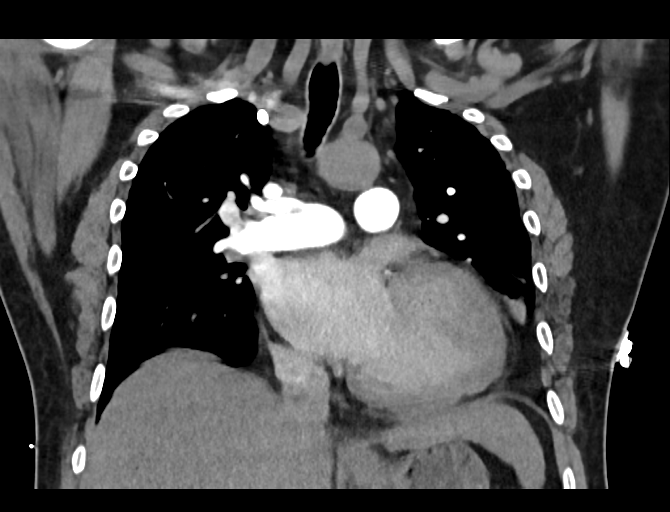

[18 of 36 positions shown; findings below may reference images not displayed]

FINDINGS: Cardiovascular: There is no demonstrable pulmonary embolus. The main
pulmonary outflow tract measures 3.5 cm in diameter, a finding that
is felt to be indicative of a degree of pulmonary arterial
hypertension. There is no thoracic aortic aneurysm. The contrast
bolus is insufficient to assess for potential dissection. There are
scattered foci of calcification at the origins of the right and left
common carotid artery. Visualized great vessels otherwise appear
unremarkable. There are foci of aortic atherosclerosis. There are
scattered foci of coronary artery calcification. Pericardium is not
appreciably thickened. Heart is mildly enlarged in a somewhat
generalized manner.

Mediastinum/Nodes: Visualized thyroid appears unremarkable. There is
no appreciable thoracic adenopathy. There is moderate air in the
esophagus. There appears to be wall thickening in the mid esophagus.

Lungs/Pleura: There is patchy bibasilar atelectasis. There is no
appreciable edema or consolidation. No pleural effusion or pleural
thickening evident.

Upper Abdomen: There is atherosclerotic calcification in the upper
abdominal aorta. The liver contour along the left lobe appear
somewhat nodular. Visualized upper abdominal structures otherwise
appear unremarkable.

Musculoskeletal: There is degenerative change in the thoracic spine.
There are no blastic or lytic bone lesions.

Review of the MIP images confirms the above findings.
IMPRESSION: 1.  No demonstrable pulmonary embolus.

2. Cardiomegaly. Prominence of the main pulmonary outflow tract
suggests underlying pulmonary arterial hypertension.

3. Foci of great vessel and atherosclerotic calcification. There are
foci of coronary artery calcification. No thoracic aortic aneurysm.
Contrast bolus is not sufficient in the aorta to assess for
potential dissection.

4.  Patchy bibasilar atelectasis.  No edema or consolidation.

5.  No evident adenopathy.

6. Liver has a somewhat nodular contour along the left lobe
anteriorly. This is a finding that raises question of potential
underlying hepatic cirrhosis.

Aortic Atherosclerosis ([id number].P).

## 2018-08-15 ENCOUNTER — Inpatient Hospital Stay (HOSPITAL_COMMUNITY)
Admission: EM | Admit: 2018-08-15 | Discharge: 2018-08-20 | DRG: 917 | Discharge status: Against Medical Advice | Payer: Medicaid Other | Attending: Family Medicine | Admitting: Family Medicine

## 2018-08-15 DIAGNOSIS — E871 Hypo-osmolality and hyponatremia: Secondary | ICD-10-CM

## 2018-08-15 DIAGNOSIS — R0602 Shortness of breath: Secondary | ICD-10-CM

## 2018-08-15 DIAGNOSIS — B182 Chronic viral hepatitis C: Secondary | ICD-10-CM | POA: Diagnosis present

## 2018-08-15 DIAGNOSIS — I484 Atypical atrial flutter: Secondary | ICD-10-CM | POA: Diagnosis present

## 2018-08-15 DIAGNOSIS — Z803 Family history of malignant neoplasm of breast: Secondary | ICD-10-CM

## 2018-08-15 DIAGNOSIS — I2510 Atherosclerotic heart disease of native coronary artery without angina pectoris: Secondary | ICD-10-CM | POA: Diagnosis present

## 2018-08-15 DIAGNOSIS — Z8249 Family history of ischemic heart disease and other diseases of the circulatory system: Secondary | ICD-10-CM

## 2018-08-15 DIAGNOSIS — I252 Old myocardial infarction: Secondary | ICD-10-CM

## 2018-08-15 DIAGNOSIS — I69398 Other sequelae of cerebral infarction: Secondary | ICD-10-CM

## 2018-08-15 DIAGNOSIS — I119 Hypertensive heart disease without heart failure: Secondary | ICD-10-CM

## 2018-08-15 DIAGNOSIS — S060X9A Concussion with loss of consciousness of unspecified duration, initial encounter: Secondary | ICD-10-CM

## 2018-08-15 DIAGNOSIS — Z888 Allergy status to other drugs, medicaments and biological substances status: Secondary | ICD-10-CM

## 2018-08-15 DIAGNOSIS — Z96652 Presence of left artificial knee joint: Secondary | ICD-10-CM | POA: Diagnosis present

## 2018-08-15 DIAGNOSIS — I472 Ventricular tachycardia, unspecified: Secondary | ICD-10-CM

## 2018-08-15 DIAGNOSIS — E1140 Type 2 diabetes mellitus with diabetic neuropathy, unspecified: Secondary | ICD-10-CM | POA: Diagnosis present

## 2018-08-15 DIAGNOSIS — I4729 Other ventricular tachycardia: Secondary | ICD-10-CM

## 2018-08-15 DIAGNOSIS — F17210 Nicotine dependence, cigarettes, uncomplicated: Secondary | ICD-10-CM | POA: Diagnosis present

## 2018-08-15 DIAGNOSIS — Z8041 Family history of malignant neoplasm of ovary: Secondary | ICD-10-CM

## 2018-08-15 DIAGNOSIS — E1121 Type 2 diabetes mellitus with diabetic nephropathy: Secondary | ICD-10-CM

## 2018-08-15 DIAGNOSIS — K219 Gastro-esophageal reflux disease without esophagitis: Secondary | ICD-10-CM | POA: Diagnosis present

## 2018-08-15 DIAGNOSIS — I4891 Unspecified atrial fibrillation: Secondary | ICD-10-CM | POA: Diagnosis present

## 2018-08-15 DIAGNOSIS — F10129 Alcohol abuse with intoxication, unspecified: Secondary | ICD-10-CM | POA: Diagnosis present

## 2018-08-15 DIAGNOSIS — Y92014 Private driveway to single-family (private) house as the place of occurrence of the external cause: Secondary | ICD-10-CM

## 2018-08-15 DIAGNOSIS — Z23 Encounter for immunization: Secondary | ICD-10-CM

## 2018-08-15 DIAGNOSIS — T405X1A Poisoning by cocaine, accidental (unintentional), initial encounter: Principal | ICD-10-CM | POA: Diagnosis present

## 2018-08-15 DIAGNOSIS — T1490XA Injury, unspecified, initial encounter: Secondary | ICD-10-CM

## 2018-08-15 DIAGNOSIS — W01198A Fall on same level from slipping, tripping and stumbling with subsequent striking against other object, initial encounter: Secondary | ICD-10-CM | POA: Diagnosis present

## 2018-08-15 DIAGNOSIS — R402252 Coma scale, best verbal response, oriented, at arrival to emergency department: Secondary | ICD-10-CM | POA: Diagnosis present

## 2018-08-15 DIAGNOSIS — Z8546 Personal history of malignant neoplasm of prostate: Secondary | ICD-10-CM

## 2018-08-15 DIAGNOSIS — I4821 Permanent atrial fibrillation: Secondary | ICD-10-CM

## 2018-08-15 DIAGNOSIS — IMO0002 Reserved for concepts with insufficient information to code with codable children: Secondary | ICD-10-CM

## 2018-08-15 DIAGNOSIS — R402142 Coma scale, eyes open, spontaneous, at arrival to emergency department: Secondary | ICD-10-CM | POA: Diagnosis present

## 2018-08-15 DIAGNOSIS — N183 Chronic kidney disease, stage 3 (moderate): Secondary | ICD-10-CM | POA: Diagnosis present

## 2018-08-15 DIAGNOSIS — S0101XA Laceration without foreign body of scalp, initial encounter: Secondary | ICD-10-CM

## 2018-08-15 DIAGNOSIS — I249 Acute ischemic heart disease, unspecified: Secondary | ICD-10-CM | POA: Diagnosis present

## 2018-08-15 DIAGNOSIS — Z7901 Long term (current) use of anticoagulants: Secondary | ICD-10-CM

## 2018-08-15 DIAGNOSIS — E1165 Type 2 diabetes mellitus with hyperglycemia: Secondary | ICD-10-CM

## 2018-08-15 DIAGNOSIS — E7800 Pure hypercholesterolemia, unspecified: Secondary | ICD-10-CM | POA: Diagnosis present

## 2018-08-15 DIAGNOSIS — R402362 Coma scale, best motor response, obeys commands, at arrival to emergency department: Secondary | ICD-10-CM | POA: Diagnosis present

## 2018-08-15 DIAGNOSIS — I130 Hypertensive heart and chronic kidney disease with heart failure and stage 1 through stage 4 chronic kidney disease, or unspecified chronic kidney disease: Secondary | ICD-10-CM | POA: Diagnosis present

## 2018-08-15 DIAGNOSIS — K7460 Unspecified cirrhosis of liver: Secondary | ICD-10-CM | POA: Diagnosis present

## 2018-08-15 DIAGNOSIS — I5043 Acute on chronic combined systolic (congestive) and diastolic (congestive) heart failure: Secondary | ICD-10-CM | POA: Diagnosis not present

## 2018-08-15 DIAGNOSIS — E1122 Type 2 diabetes mellitus with diabetic chronic kidney disease: Secondary | ICD-10-CM | POA: Diagnosis present

## 2018-08-15 DIAGNOSIS — Z794 Long term (current) use of insulin: Secondary | ICD-10-CM
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
## 2018-08-16 ENCOUNTER — Emergency Department (HOSPITAL_COMMUNITY): Payer: Medicaid Other

## 2018-08-16 ENCOUNTER — Encounter (HOSPITAL_COMMUNITY): Payer: Self-pay | Admitting: Emergency Medicine

## 2018-08-16 DIAGNOSIS — I4729 Other ventricular tachycardia: Secondary | ICD-10-CM

## 2018-08-16 DIAGNOSIS — S060X9A Concussion with loss of consciousness of unspecified duration, initial encounter: Secondary | ICD-10-CM

## 2018-08-16 DIAGNOSIS — I252 Old myocardial infarction: Secondary | ICD-10-CM | POA: Diagnosis not present

## 2018-08-16 DIAGNOSIS — W01198A Fall on same level from slipping, tripping and stumbling with subsequent striking against other object, initial encounter: Secondary | ICD-10-CM | POA: Diagnosis present

## 2018-08-16 DIAGNOSIS — I249 Acute ischemic heart disease, unspecified: Secondary | ICD-10-CM | POA: Diagnosis present

## 2018-08-16 DIAGNOSIS — Y92014 Private driveway to single-family (private) house as the place of occurrence of the external cause: Secondary | ICD-10-CM | POA: Diagnosis not present

## 2018-08-16 DIAGNOSIS — Z8546 Personal history of malignant neoplasm of prostate: Secondary | ICD-10-CM | POA: Diagnosis not present

## 2018-08-16 DIAGNOSIS — E871 Hypo-osmolality and hyponatremia: Secondary | ICD-10-CM

## 2018-08-16 DIAGNOSIS — S0101XA Laceration without foreign body of scalp, initial encounter: Secondary | ICD-10-CM

## 2018-08-16 DIAGNOSIS — I428 Other cardiomyopathies: Secondary | ICD-10-CM | POA: Diagnosis not present

## 2018-08-16 DIAGNOSIS — Z23 Encounter for immunization: Secondary | ICD-10-CM | POA: Diagnosis not present

## 2018-08-16 DIAGNOSIS — E1121 Type 2 diabetes mellitus with diabetic nephropathy: Secondary | ICD-10-CM | POA: Diagnosis present

## 2018-08-16 DIAGNOSIS — E7800 Pure hypercholesterolemia, unspecified: Secondary | ICD-10-CM | POA: Diagnosis present

## 2018-08-16 DIAGNOSIS — T1490XA Injury, unspecified, initial encounter: Secondary | ICD-10-CM | POA: Diagnosis not present

## 2018-08-16 DIAGNOSIS — I4821 Permanent atrial fibrillation: Secondary | ICD-10-CM

## 2018-08-16 DIAGNOSIS — R402142 Coma scale, eyes open, spontaneous, at arrival to emergency department: Secondary | ICD-10-CM | POA: Diagnosis present

## 2018-08-16 DIAGNOSIS — I5043 Acute on chronic combined systolic (congestive) and diastolic (congestive) heart failure: Secondary | ICD-10-CM | POA: Diagnosis not present

## 2018-08-16 DIAGNOSIS — N183 Chronic kidney disease, stage 3 (moderate): Secondary | ICD-10-CM | POA: Diagnosis present

## 2018-08-16 DIAGNOSIS — R402362 Coma scale, best motor response, obeys commands, at arrival to emergency department: Secondary | ICD-10-CM | POA: Diagnosis present

## 2018-08-16 DIAGNOSIS — K7460 Unspecified cirrhosis of liver: Secondary | ICD-10-CM | POA: Diagnosis present

## 2018-08-16 DIAGNOSIS — K219 Gastro-esophageal reflux disease without esophagitis: Secondary | ICD-10-CM | POA: Diagnosis present

## 2018-08-16 DIAGNOSIS — I119 Hypertensive heart disease without heart failure: Secondary | ICD-10-CM

## 2018-08-16 DIAGNOSIS — E1140 Type 2 diabetes mellitus with diabetic neuropathy, unspecified: Secondary | ICD-10-CM | POA: Diagnosis present

## 2018-08-16 DIAGNOSIS — I5022 Chronic systolic (congestive) heart failure: Secondary | ICD-10-CM | POA: Diagnosis not present

## 2018-08-16 DIAGNOSIS — I484 Atypical atrial flutter: Secondary | ICD-10-CM | POA: Diagnosis present

## 2018-08-16 DIAGNOSIS — I130 Hypertensive heart and chronic kidney disease with heart failure and stage 1 through stage 4 chronic kidney disease, or unspecified chronic kidney disease: Secondary | ICD-10-CM | POA: Diagnosis present

## 2018-08-16 DIAGNOSIS — I472 Ventricular tachycardia: Secondary | ICD-10-CM | POA: Diagnosis present

## 2018-08-16 DIAGNOSIS — T405X1A Poisoning by cocaine, accidental (unintentional), initial encounter: Secondary | ICD-10-CM | POA: Diagnosis present

## 2018-08-16 DIAGNOSIS — N184 Chronic kidney disease, stage 4 (severe): Secondary | ICD-10-CM | POA: Diagnosis not present

## 2018-08-16 DIAGNOSIS — B182 Chronic viral hepatitis C: Secondary | ICD-10-CM | POA: Diagnosis present

## 2018-08-16 DIAGNOSIS — I69398 Other sequelae of cerebral infarction: Secondary | ICD-10-CM | POA: Diagnosis not present

## 2018-08-16 DIAGNOSIS — E1122 Type 2 diabetes mellitus with diabetic chronic kidney disease: Secondary | ICD-10-CM | POA: Diagnosis present

## 2018-08-16 DIAGNOSIS — R402252 Coma scale, best verbal response, oriented, at arrival to emergency department: Secondary | ICD-10-CM | POA: Diagnosis present

## 2018-08-16 DIAGNOSIS — I481 Persistent atrial fibrillation: Secondary | ICD-10-CM | POA: Diagnosis not present

## 2018-08-16 DIAGNOSIS — I482 Chronic atrial fibrillation: Secondary | ICD-10-CM | POA: Diagnosis not present

## 2018-08-16 DIAGNOSIS — I2510 Atherosclerotic heart disease of native coronary artery without angina pectoris: Secondary | ICD-10-CM | POA: Diagnosis present

## 2018-08-16 LAB — GLUCOSE, CAPILLARY
Glucose-Capillary: 148 mg/dL — ABNORMAL HIGH (ref 70–99)
Glucose-Capillary: 164 mg/dL — ABNORMAL HIGH (ref 70–99)
Glucose-Capillary: 158 mg/dL — ABNORMAL HIGH (ref 70–99)

## 2018-08-16 LAB — CBG MONITORING, ED: Glucose-Capillary: 148 mg/dL — ABNORMAL HIGH (ref 70–99)

## 2018-08-16 LAB — CBC WITH DIFFERENTIAL/PLATELET
Abs Immature Granulocytes: 0 10*3/uL (ref 0.0–0.1)
Basophils Absolute: 0.1 10*3/uL (ref 0.0–0.1)
Basophils Relative: 1 %
Eosinophils Absolute: 0.1 10*3/uL (ref 0.0–0.7)
Eosinophils Relative: 1 %
HCT: 35.1 % — ABNORMAL LOW (ref 39.0–52.0)
Hemoglobin: 11.9 g/dL — ABNORMAL LOW (ref 13.0–17.0)
Immature Granulocytes: 0 %
Lymphocytes Relative: 28 %
Lymphs Abs: 1.7 10*3/uL (ref 0.7–4.0)
MCH: 32.1 pg (ref 26.0–34.0)
MCHC: 33.9 g/dL (ref 30.0–36.0)
MCV: 94.6 fL (ref 78.0–100.0)
Monocytes Absolute: 0.8 10*3/uL (ref 0.1–1.0)
Monocytes Relative: 14 %
Neutro Abs: 3.4 10*3/uL (ref 1.7–7.7)
Neutrophils Relative %: 56 %
Platelets: 147 10*3/uL — ABNORMAL LOW (ref 150–400)
RBC: 3.71 MIL/uL — ABNORMAL LOW (ref 4.22–5.81)
RDW: 11.9 % (ref 11.5–15.5)
WBC: 6 10*3/uL (ref 4.0–10.5)

## 2018-08-16 LAB — I-STAT CHEM 8, ED
BUN: 18 mg/dL (ref 6–20)
Calcium, Ion: 1.09 mmol/L — ABNORMAL LOW (ref 1.15–1.40)
Chloride: 87 mmol/L — ABNORMAL LOW (ref 98–111)
Creatinine, Ser: 1.9 mg/dL — ABNORMAL HIGH (ref 0.61–1.24)
Glucose, Bld: 145 mg/dL — ABNORMAL HIGH (ref 70–99)
HCT: 39 % (ref 39.0–52.0)
Hemoglobin: 13.3 g/dL (ref 13.0–17.0)
Potassium: 4.1 mmol/L (ref 3.5–5.1)
Sodium: 122 mmol/L — ABNORMAL LOW (ref 135–145)
TCO2: 25 mmol/L (ref 22–32)

## 2018-08-16 LAB — OSMOLALITY: Osmolality: 267 mOsm/kg — ABNORMAL LOW (ref 275–295)

## 2018-08-16 LAB — COMPREHENSIVE METABOLIC PANEL
ALT: 15 U/L (ref 0–44)
AST: 23 U/L (ref 15–41)
Albumin: 3.2 g/dL — ABNORMAL LOW (ref 3.5–5.0)
Alkaline Phosphatase: 60 U/L (ref 38–126)
Anion gap: 11 (ref 5–15)
BUN: 15 mg/dL (ref 6–20)
CO2: 23 mmol/L (ref 22–32)
Calcium: 8.7 mg/dL — ABNORMAL LOW (ref 8.9–10.3)
Chloride: 88 mmol/L — ABNORMAL LOW (ref 98–111)
Creatinine, Ser: 1.61 mg/dL — ABNORMAL HIGH (ref 0.61–1.24)
GFR calc Af Amer: 53 mL/min — ABNORMAL LOW (ref >60)
GFR calc non Af Amer: 46 mL/min — ABNORMAL LOW (ref >60)
Glucose, Bld: 140 mg/dL — ABNORMAL HIGH (ref 70–99)
Potassium: 4.1 mmol/L (ref 3.5–5.1)
Sodium: 122 mmol/L — ABNORMAL LOW (ref 135–145)
Total Bilirubin: 1.2 mg/dL (ref 0.3–1.2)
Total Protein: 7.4 g/dL (ref 6.5–8.1)

## 2018-08-16 LAB — OSMOLALITY, URINE: Osmolality, Ur: 148 mOsm/kg — ABNORMAL LOW (ref 300–900)

## 2018-08-16 LAB — BASIC METABOLIC PANEL
Anion gap: 10 (ref 5–15)
Anion gap: 11 (ref 5–15)
Anion gap: 12 (ref 5–15)
Anion gap: 9 (ref 5–15)
Anion gap: 8 (ref 5–15)
BUN: 14 mg/dL (ref 6–20)
BUN: 14 mg/dL (ref 6–20)
BUN: 13 mg/dL (ref 6–20)
BUN: 13 mg/dL (ref 6–20)
BUN: 13 mg/dL (ref 6–20)
CO2: 22 mmol/L (ref 22–32)
CO2: 23 mmol/L (ref 22–32)
CO2: 23 mmol/L (ref 22–32)
CO2: 26 mmol/L (ref 22–32)
CO2: 27 mmol/L (ref 22–32)
Calcium: 8.3 mg/dL — ABNORMAL LOW (ref 8.9–10.3)
Calcium: 8.7 mg/dL — ABNORMAL LOW (ref 8.9–10.3)
Calcium: 8.5 mg/dL — ABNORMAL LOW (ref 8.9–10.3)
Calcium: 8.5 mg/dL — ABNORMAL LOW (ref 8.9–10.3)
Calcium: 8.7 mg/dL — ABNORMAL LOW (ref 8.9–10.3)
Chloride: 90 mmol/L — ABNORMAL LOW (ref 98–111)
Chloride: 91 mmol/L — ABNORMAL LOW (ref 98–111)
Chloride: 92 mmol/L — ABNORMAL LOW (ref 98–111)
Chloride: 93 mmol/L — ABNORMAL LOW (ref 98–111)
Chloride: 94 mmol/L — ABNORMAL LOW (ref 98–111)
Creatinine, Ser: 1.46 mg/dL — ABNORMAL HIGH (ref 0.61–1.24)
Creatinine, Ser: 1.46 mg/dL — ABNORMAL HIGH (ref 0.61–1.24)
Creatinine, Ser: 1.37 mg/dL — ABNORMAL HIGH (ref 0.61–1.24)
Creatinine, Ser: 1.54 mg/dL — ABNORMAL HIGH (ref 0.61–1.24)
Creatinine, Ser: 1.45 mg/dL — ABNORMAL HIGH (ref 0.61–1.24)
GFR calc Af Amer: 59 mL/min — ABNORMAL LOW (ref >60)
GFR calc Af Amer: 59 mL/min — ABNORMAL LOW (ref >60)
GFR calc Af Amer: >60 mL/min (ref >60)
GFR calc Af Amer: 56 mL/min — ABNORMAL LOW (ref >60)
GFR calc Af Amer: 60 mL/min — ABNORMAL LOW (ref >60)
GFR calc non Af Amer: 51 mL/min — ABNORMAL LOW (ref >60)
GFR calc non Af Amer: 51 mL/min — ABNORMAL LOW (ref >60)
GFR calc non Af Amer: 55 mL/min — ABNORMAL LOW (ref >60)
GFR calc non Af Amer: 48 mL/min — ABNORMAL LOW (ref >60)
GFR calc non Af Amer: 52 mL/min — ABNORMAL LOW (ref >60)
Glucose, Bld: 160 mg/dL — ABNORMAL HIGH (ref 70–99)
Glucose, Bld: 154 mg/dL — ABNORMAL HIGH (ref 70–99)
Glucose, Bld: 149 mg/dL — ABNORMAL HIGH (ref 70–99)
Glucose, Bld: 166 mg/dL — ABNORMAL HIGH (ref 70–99)
Glucose, Bld: 162 mg/dL — ABNORMAL HIGH (ref 70–99)
Potassium: 3.7 mmol/L (ref 3.5–5.1)
Potassium: 3.9 mmol/L (ref 3.5–5.1)
Potassium: 3.9 mmol/L (ref 3.5–5.1)
Potassium: 3.8 mmol/L (ref 3.5–5.1)
Potassium: 4 mmol/L (ref 3.5–5.1)
Sodium: 122 mmol/L — ABNORMAL LOW (ref 135–145)
Sodium: 125 mmol/L — ABNORMAL LOW (ref 135–145)
Sodium: 127 mmol/L — ABNORMAL LOW (ref 135–145)
Sodium: 128 mmol/L — ABNORMAL LOW (ref 135–145)
Sodium: 129 mmol/L — ABNORMAL LOW (ref 135–145)

## 2018-08-16 LAB — CK: Total CK: 146 U/L (ref 49–397)

## 2018-08-16 LAB — PHOSPHORUS: Phosphorus: 3.3 mg/dL (ref 2.5–4.6)

## 2018-08-16 LAB — MAGNESIUM
Magnesium: 2.5 mg/dL — ABNORMAL HIGH (ref 1.7–2.4)
Magnesium: 2.6 mg/dL — ABNORMAL HIGH (ref 1.7–2.4)

## 2018-08-16 LAB — SODIUM, URINE, RANDOM: Sodium, Ur: <10 mmol/L

## 2018-08-16 LAB — RAPID URINE DRUG SCREEN, HOSP PERFORMED
Amphetamines: NOT DETECTED
Barbiturates: NOT DETECTED
Benzodiazepines: POSITIVE — AB
Cocaine: POSITIVE — AB
Opiates: NOT DETECTED
Tetrahydrocannabinol: NOT DETECTED

## 2018-08-16 LAB — TROPONIN I: Troponin I: 0.04 ng/mL (ref <0.03)

## 2018-08-16 LAB — TSH: TSH: 3.138 u[IU]/mL (ref 0.350–4.500)

## 2018-08-16 LAB — ETHANOL: Alcohol, Ethyl (B): 76 mg/dL — ABNORMAL HIGH (ref <10)

## 2018-08-16 MED ORDER — INSULIN ASPART 100 UNIT/ML ~~LOC~~ SOLN
0.0000 [IU] | Freq: Three times a day (TID) | SUBCUTANEOUS | Status: DC
  Administered 2018-08-16: 1 [IU] via SUBCUTANEOUS
  Administered 2018-08-17: 2 [IU] via SUBCUTANEOUS
  Administered 2018-08-17: 3 [IU] via SUBCUTANEOUS
  Administered 2018-08-18: 2 [IU] via SUBCUTANEOUS
  Administered 2018-08-18 – 2018-08-19 (×3): 3 [IU] via SUBCUTANEOUS
  Administered 2018-08-19: 2 [IU] via SUBCUTANEOUS
  Administered 2018-08-20: 1 [IU] via SUBCUTANEOUS
  Administered 2018-08-20: 2 [IU] via SUBCUTANEOUS
  Filled 2018-08-16: qty 1

## 2018-08-16 MED ORDER — FOLIC ACID 1 MG PO TABS
1.0000 mg | ORAL_TABLET | Freq: Every day | ORAL | Status: DC
  Administered 2018-08-16 – 2018-08-20 (×5): 1 mg via ORAL
  Filled 2018-08-16 (×5): qty 1

## 2018-08-16 MED ORDER — TRANEXAMIC ACID 1000 MG/10ML IV SOLN
1000.0000 mg | Freq: Once | INTRAVENOUS | Status: DC
  Filled 2018-08-16: qty 10

## 2018-08-16 MED ORDER — AMIODARONE LOAD VIA INFUSION
150.0000 mg | Freq: Once | INTRAVENOUS | Status: AC
  Administered 2018-08-16: 150 mg via INTRAVENOUS
  Filled 2018-08-16: qty 83.34

## 2018-08-16 MED ORDER — IOPAMIDOL (ISOVUE-300) INJECTION 61%
80.0000 mL | Freq: Once | INTRAVENOUS | Status: AC | PRN
  Administered 2018-08-16: 80 mL via INTRAVENOUS

## 2018-08-16 MED ORDER — LORAZEPAM 1 MG PO TABS
1.0000 mg | ORAL_TABLET | Freq: Four times a day (QID) | ORAL | Status: AC | PRN
  Administered 2018-08-18: 1 mg via ORAL
  Filled 2018-08-16: qty 1

## 2018-08-16 MED ORDER — APIXABAN 5 MG PO TABS
5.0000 mg | ORAL_TABLET | Freq: Two times a day (BID) | ORAL | Status: DC
  Administered 2018-08-16 – 2018-08-20 (×9): 5 mg via ORAL
  Filled 2018-08-16 (×9): qty 1

## 2018-08-16 MED ORDER — ADULT MULTIVITAMIN W/MINERALS CH
1.0000 | ORAL_TABLET | Freq: Every day | ORAL | Status: DC
  Administered 2018-08-16 – 2018-08-20 (×5): 1 via ORAL
  Filled 2018-08-16 (×5): qty 1

## 2018-08-16 MED ORDER — THIAMINE HCL 100 MG/ML IJ SOLN
100.0000 mg | Freq: Every day | INTRAMUSCULAR | Status: DC
  Filled 2018-08-16 (×2): qty 2

## 2018-08-16 MED ORDER — SODIUM CHLORIDE 0.9 % IV BOLUS
250.0000 mL | Freq: Once | INTRAVENOUS | Status: AC
  Administered 2018-08-16: 250 mL via INTRAVENOUS

## 2018-08-16 MED ORDER — LORAZEPAM 2 MG/ML IJ SOLN: 1.0000 mg | Freq: Four times a day (QID) | INTRAMUSCULAR | Status: AC | PRN

## 2018-08-16 MED ORDER — ACETAMINOPHEN 325 MG PO TABS
650.0000 mg | ORAL_TABLET | Freq: Four times a day (QID) | ORAL | Status: DC | PRN
  Administered 2018-08-16 – 2018-08-19 (×6): 650 mg via ORAL
  Filled 2018-08-16 (×6): qty 2

## 2018-08-16 MED ORDER — AMIODARONE HCL IN DEXTROSE 360-4.14 MG/200ML-% IV SOLN
60.0000 mg/h | INTRAVENOUS | Status: AC
  Administered 2018-08-16: 60 mg/h via INTRAVENOUS
  Filled 2018-08-16: qty 200

## 2018-08-16 MED ORDER — MAGNESIUM SULFATE 2 GM/50ML IV SOLN: 2.0000 g | Freq: Once | INTRAVENOUS | Status: DC

## 2018-08-16 MED ORDER — SODIUM CHLORIDE 0.9 % IV SOLN
INTRAVENOUS | Status: DC
  Administered 2018-08-16 (×2): via INTRAVENOUS

## 2018-08-16 MED ORDER — ACETAMINOPHEN 650 MG RE SUPP: 650.0000 mg | Freq: Four times a day (QID) | RECTAL | Status: DC | PRN

## 2018-08-16 MED ORDER — IOPAMIDOL (ISOVUE-300) INJECTION 61%
INTRAVENOUS | Status: AC
  Filled 2018-08-16: qty 100

## 2018-08-16 MED ORDER — VITAMIN B-1 100 MG PO TABS
100.0000 mg | ORAL_TABLET | Freq: Every day | ORAL | Status: DC
  Administered 2018-08-16 – 2018-08-20 (×5): 100 mg via ORAL
  Filled 2018-08-16 (×5): qty 1

## 2018-08-16 MED ORDER — FUROSEMIDE 20 MG PO TABS: 20.0000 mg | ORAL_TABLET | Freq: Every day | ORAL | Status: DC

## 2018-08-16 MED ORDER — MAGNESIUM SULFATE 2 GM/50ML IV SOLN
2.0000 g | Freq: Once | INTRAVENOUS | Status: AC
  Administered 2018-08-16: 2 g via INTRAVENOUS
  Filled 2018-08-16: qty 50

## 2018-08-16 MED ORDER — TETANUS-DIPHTH-ACELL PERTUSSIS 5-2.5-18.5 LF-MCG/0.5 IM SUSP
0.5000 mL | Freq: Once | INTRAMUSCULAR | Status: AC
  Administered 2018-08-16: 0.5 mL via INTRAMUSCULAR
  Filled 2018-08-16: qty 0.5

## 2018-08-16 MED ORDER — FUROSEMIDE 20 MG PO TABS
20.0000 mg | ORAL_TABLET | Freq: Every day | ORAL | Status: DC
  Administered 2018-08-16: 20 mg via ORAL
  Filled 2018-08-16: qty 1

## 2018-08-16 MED ORDER — AMIODARONE HCL IN DEXTROSE 360-4.14 MG/200ML-% IV SOLN
30.0000 mg/h | INTRAVENOUS | Status: DC
  Administered 2018-08-16 – 2018-08-17 (×5): 30 mg/h via INTRAVENOUS
  Filled 2018-08-16 (×5): qty 200
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
## 2018-08-16 NOTE — ED Notes (Signed)
Attempt to call report to 4E, advised RN will call for report when assignment has been made.
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
## 2018-08-16 NOTE — ED Notes (Signed)
Nephew Alfie Rideaux said call when is patient is discharged [phone]

## 2018-08-16 NOTE — ED Notes (Signed)
6.0 nasopharyngeal airway placed R nare by Dr. Wyvonnia Dusky, pt tolerated well, no resistance to placement.

## 2018-08-16 NOTE — Progress Notes (Signed)
Patient's Spo2 89% on 10L [place].  RT changed O2 device to [place] HFNC and had to turn it up to 15L to obtain spo2 of 93%.  Patient does not appear to be in respiratory distress at this time. Breath sounds are Rhonchi. Patient states he is not SOB at this time. RT will continue to monitor patient as needed. RN is aware
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
## 2018-08-16 NOTE — ED Notes (Signed)
Family practice at bedside, pt had another 5 beat run of VT, provider aware, pt now alert to person and place.
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
## 2018-08-16 NOTE — Progress Notes (Signed)
Patient pulled nasal airway out and took O2 off. Patient's O2 was about 82% RA. O2 placed back on patient at 4 liters and O2 is now 95%. MD notified.   Emelda Fear, RN

## 2018-08-16 NOTE — Progress Notes (Signed)
Patient arrived to 4E room 07 at this time. V/S and assessment done. Telemetry applied and CCMD notified. Patient oriented to room and hpw to call nurse with any needs.

## 2018-08-16 NOTE — Progress Notes (Signed)
Dr. Higinio Plan notified of patient 3 and 4 beat run of wide QRS Vtach at this time. No new orders.

## 2018-08-16 NOTE — ED Notes (Signed)
Pt to CT via stretcher with RN and pharmacy on CCM. Pt continues to be confused, pt believed he had a visitor in his room that was not present.

## 2018-08-16 NOTE — Progress Notes (Signed)
Report received from [place], [place] at this time

## 2018-08-16 NOTE — ED Notes (Signed)
Cards at bedside

## 2018-08-17 ENCOUNTER — Other Ambulatory Visit: Payer: Self-pay

## 2018-08-17 ENCOUNTER — Encounter (HOSPITAL_COMMUNITY): Payer: Self-pay | Admitting: General Practice

## 2018-08-17 ENCOUNTER — Inpatient Hospital Stay (HOSPITAL_COMMUNITY): Payer: Medicaid Other

## 2018-08-17 DIAGNOSIS — I5022 Chronic systolic (congestive) heart failure: Secondary | ICD-10-CM

## 2018-08-17 DIAGNOSIS — I428 Other cardiomyopathies: Secondary | ICD-10-CM

## 2018-08-17 DIAGNOSIS — I484 Atypical atrial flutter: Secondary | ICD-10-CM

## 2018-08-17 DIAGNOSIS — I482 Chronic atrial fibrillation: Secondary | ICD-10-CM

## 2018-08-17 DIAGNOSIS — I481 Persistent atrial fibrillation: Secondary | ICD-10-CM

## 2018-08-17 LAB — HEPATIC FUNCTION PANEL
ALT: 13 U/L (ref 0–44)
AST: 21 U/L (ref 15–41)
Albumin: 3 g/dL — ABNORMAL LOW (ref 3.5–5.0)
Alkaline Phosphatase: 57 U/L (ref 38–126)
Bilirubin, Direct: 0.3 mg/dL — ABNORMAL HIGH (ref 0.0–0.2)
Indirect Bilirubin: 0.7 mg/dL (ref 0.3–0.9)
Total Bilirubin: 1 mg/dL (ref 0.3–1.2)
Total Protein: 7.2 g/dL (ref 6.5–8.1)

## 2018-08-17 LAB — BASIC METABOLIC PANEL
Anion gap: 10 (ref 5–15)
BUN: 13 mg/dL (ref 6–20)
CO2: 25 mmol/L (ref 22–32)
Calcium: 8.8 mg/dL — ABNORMAL LOW (ref 8.9–10.3)
Chloride: 93 mmol/L — ABNORMAL LOW (ref 98–111)
Creatinine, Ser: 1.49 mg/dL — ABNORMAL HIGH (ref 0.61–1.24)
GFR calc Af Amer: 58 mL/min — ABNORMAL LOW (ref >60)
GFR calc non Af Amer: 50 mL/min — ABNORMAL LOW (ref >60)
Glucose, Bld: 156 mg/dL — ABNORMAL HIGH (ref 70–99)
Potassium: 4.1 mmol/L (ref 3.5–5.1)
Sodium: 128 mmol/L — ABNORMAL LOW (ref 135–145)

## 2018-08-17 LAB — CBC
HCT: 33.4 % — ABNORMAL LOW (ref 39.0–52.0)
Hemoglobin: 11.2 g/dL — ABNORMAL LOW (ref 13.0–17.0)
MCH: 31.9 pg (ref 26.0–34.0)
MCHC: 33.5 g/dL (ref 30.0–36.0)
MCV: 95.2 fL (ref 78.0–100.0)
Platelets: 153 10*3/uL (ref 150–400)
RBC: 3.51 MIL/uL — ABNORMAL LOW (ref 4.22–5.81)
RDW: 12.2 % (ref 11.5–15.5)
WBC: 7.1 10*3/uL (ref 4.0–10.5)

## 2018-08-17 LAB — GLUCOSE, CAPILLARY
Glucose-Capillary: 164 mg/dL — ABNORMAL HIGH (ref 70–99)
Glucose-Capillary: 152 mg/dL — ABNORMAL HIGH (ref 70–99)
Glucose-Capillary: 231 mg/dL — ABNORMAL HIGH (ref 70–99)

## 2018-08-17 MED ORDER — FUROSEMIDE 10 MG/ML IJ SOLN
20.0000 mg | Freq: Once | INTRAMUSCULAR | Status: AC
  Administered 2018-08-17: 20 mg via INTRAVENOUS
  Filled 2018-08-17: qty 2

## 2018-08-17 MED ORDER — HYDROCODONE-ACETAMINOPHEN 5-325 MG PO TABS
1.0000 | ORAL_TABLET | Freq: Four times a day (QID) | ORAL | Status: AC | PRN
  Administered 2018-08-17 – 2018-08-18 (×2): 1 via ORAL
  Filled 2018-08-17 (×2): qty 1

## 2018-08-17 MED ORDER — GABAPENTIN 600 MG PO TABS
300.0000 mg | ORAL_TABLET | Freq: Two times a day (BID) | ORAL | Status: DC
  Administered 2018-08-17 – 2018-08-20 (×6): 300 mg via ORAL
  Filled 2018-08-17 (×7): qty 1

## 2018-08-17 MED ORDER — TRAMADOL HCL 50 MG PO TABS
50.0000 mg | ORAL_TABLET | Freq: Once | ORAL | Status: AC | PRN
  Administered 2018-08-17: 50 mg via ORAL
  Filled 2018-08-17: qty 1

## 2018-08-17 MED ORDER — OXYCODONE-ACETAMINOPHEN 5-325 MG PO TABS
1.0000 | ORAL_TABLET | Freq: Once | ORAL | Status: AC
  Administered 2018-08-17: 1 via ORAL
  Filled 2018-08-17: qty 1

## 2018-08-17 MED ORDER — ENSURE ENLIVE PO LIQD: 237.0000 mL | Freq: Two times a day (BID) | ORAL | Status: DC

## 2018-08-17 MED ORDER — FUROSEMIDE 10 MG/ML IJ SOLN: 40.0000 mg | Freq: Every day | INTRAMUSCULAR | Status: DC

## 2018-08-17 MED ORDER — FUROSEMIDE 10 MG/ML IJ SOLN
40.0000 mg | Freq: Two times a day (BID) | INTRAMUSCULAR | Status: DC
  Administered 2018-08-17 – 2018-08-18 (×2): 40 mg via INTRAVENOUS
  Filled 2018-08-17 (×2): qty 4

## 2018-08-17 NOTE — Plan of Care (Signed)
  Problem: Education: Goal: Knowledge of General Education information will improve Description Including pain rating scale, medication(s)/side effects and non-pharmacologic comfort measures Outcome: Progressing   Problem: Clinical Measurements: Goal: Will remain free from infection Outcome: Progressing Goal: Cardiovascular complication will be avoided Outcome: Progressing   Problem: Nutrition: Goal: Adequate nutrition will be maintained Outcome: Progressing   Problem: Health Behavior/Discharge Planning: Goal: Ability to manage health-related needs will improve Outcome: Not Progressing   Problem: Clinical Measurements: Goal: Respiratory complications will improve Outcome: Not Progressing   Problem: Safety: Goal: Ability to remain free from injury will improve Outcome: Not Progressing

## 2018-08-17 NOTE — Progress Notes (Signed)
RT placed patient on 100% NRB due to patient's spo2 decreasing to 85% on 15L HFNC([place]). Patient's spo2 increased to 98%. Breath sounds are rhonchi throughout and diminished in bases. RN called MD. RN and RT at bedside.

## 2018-08-17 NOTE — Progress Notes (Signed)
Patient ID: Brad [name], male   DOB: 1961-01-14, 56 y.o.   MRN: [id number]  S: Called to bedside increased shortness of breath. Pt already placed on 100% NRB by RT for spO2 decreasing to 85% on 15L HFNC. Pt notes a gradual onset of shortness of breath for the past two days, with associated central chest pressure. This has not increased in severity since onset. Endorses orthopnea. Known CHF, pt has been taking lasix 40mg  BID at home.   O: Blood pressure (!) 135/107, pulse (!) 102, temperature 97.7 F (36.5 C), temperature source Oral, resp. rate 20, height 5\' 11"  (1.803 m), weight 84.6 kg, SpO2 96 %. Gen: Alert, mild respiratory distress, sitting up answering questions  Cardiac: Tachycardic, RRR no m/g/r  Lungs: Diffuse rhonchi and crackles in all lung fields, on NRB 15L O2 satting 98-100%, able to speak in full sentences, sub costal retractions.    Ext: Warm, dry, trace to 1+ pitting edema to mid-shin.  Neuro: Alert and oriented x3. Able to follow commands.   A/P: Clinical picture consistent with fluid overload in setting of recent mIVF and history of heart failure with EF 20-35%. Less likely pneumonia at this time given afebrile, no sputum production, and without leukocytosis. However, would have a low threshold for initiating antibiotics if clinically worsening or becomes febrile. Will monitor closely, have discussed this plan with his nurse to notify us if any worsening of respiratory status or becomes febrile.  -Obtain portable CXR -D/c 9ml/hr NS  -Lasix 20 IV once; reassess fluid status  Darrelyn Hillock, DO  PGY-1 Family Medicine   Update: Reviewed CXR showing increased interstitial edema with small bilateral effusions. Will give another lasix IV 20mg  and reassess.
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
## 2018-08-17 NOTE — Progress Notes (Signed)
Removed non-rebreather from patient and placed pt on High flow nasal canula at 14 liters. Pt saturations 96-100%. Initially attempted 13 liters and pt kept watching monitor and breathing through mouth.  Pt educated and explained that we would gradually try to wean from oxygen. Pt resting with call bell within reach.  Will continue to monitor. Payton Emerald, RN
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
## 2018-08-18 ENCOUNTER — Telehealth: Payer: Self-pay | Admitting: Family Medicine

## 2018-08-18 LAB — HEPATIC FUNCTION PANEL
ALT: 14 U/L (ref 0–44)
AST: 20 U/L (ref 15–41)
Albumin: 2.9 g/dL — ABNORMAL LOW (ref 3.5–5.0)
Alkaline Phosphatase: 58 U/L (ref 38–126)
Bilirubin, Direct: 0.3 mg/dL — ABNORMAL HIGH (ref 0.0–0.2)
Indirect Bilirubin: 0.8 mg/dL (ref 0.3–0.9)
Total Bilirubin: 1.1 mg/dL (ref 0.3–1.2)
Total Protein: 7.2 g/dL (ref 6.5–8.1)

## 2018-08-18 LAB — CBC
HCT: 33.8 % — ABNORMAL LOW (ref 39.0–52.0)
Hemoglobin: 11.6 g/dL — ABNORMAL LOW (ref 13.0–17.0)
MCH: 32.9 pg (ref 26.0–34.0)
MCHC: 34.3 g/dL (ref 30.0–36.0)
MCV: 95.8 fL (ref 78.0–100.0)
Platelets: 142 10*3/uL — ABNORMAL LOW (ref 150–400)
RBC: 3.53 MIL/uL — ABNORMAL LOW (ref 4.22–5.81)
RDW: 12.3 % (ref 11.5–15.5)
WBC: 7.4 10*3/uL (ref 4.0–10.5)

## 2018-08-18 LAB — BASIC METABOLIC PANEL
Anion gap: 10 (ref 5–15)
BUN: 16 mg/dL (ref 6–20)
CO2: 26 mmol/L (ref 22–32)
Calcium: 8.6 mg/dL — ABNORMAL LOW (ref 8.9–10.3)
Chloride: 91 mmol/L — ABNORMAL LOW (ref 98–111)
Creatinine, Ser: 1.72 mg/dL — ABNORMAL HIGH (ref 0.61–1.24)
GFR calc Af Amer: 49 mL/min — ABNORMAL LOW (ref >60)
GFR calc non Af Amer: 42 mL/min — ABNORMAL LOW (ref >60)
Glucose, Bld: 168 mg/dL — ABNORMAL HIGH (ref 70–99)
Potassium: 3.8 mmol/L (ref 3.5–5.1)
Sodium: 127 mmol/L — ABNORMAL LOW (ref 135–145)

## 2018-08-18 LAB — MRSA PCR SCREENING: MRSA by PCR: NEGATIVE

## 2018-08-18 LAB — GLUCOSE, CAPILLARY
Glucose-Capillary: 162 mg/dL — ABNORMAL HIGH (ref 70–99)
Glucose-Capillary: 181 mg/dL — ABNORMAL HIGH (ref 70–99)
Glucose-Capillary: 212 mg/dL — ABNORMAL HIGH (ref 70–99)
Glucose-Capillary: 175 mg/dL — ABNORMAL HIGH (ref 70–99)

## 2018-08-18 MED ORDER — CARVEDILOL 6.25 MG PO TABS
6.2500 mg | ORAL_TABLET | Freq: Two times a day (BID) | ORAL | Status: DC
  Administered 2018-08-18 – 2018-08-20 (×4): 6.25 mg via ORAL
  Filled 2018-08-18 (×4): qty 1

## 2018-08-18 MED ORDER — SPIRONOLACTONE 25 MG PO TABS
25.0000 mg | ORAL_TABLET | Freq: Every day | ORAL | Status: DC
  Administered 2018-08-18 – 2018-08-20 (×3): 25 mg via ORAL
  Filled 2018-08-18 (×3): qty 1

## 2018-08-18 MED ORDER — HYDROCODONE-ACETAMINOPHEN 5-325 MG PO TABS
1.0000 | ORAL_TABLET | Freq: Four times a day (QID) | ORAL | Status: AC | PRN
  Administered 2018-08-18 – 2018-08-20 (×6): 1 via ORAL
  Filled 2018-08-18 (×6): qty 1

## 2018-08-18 MED ORDER — AMITRIPTYLINE HCL 50 MG PO TABS
75.0000 mg | ORAL_TABLET | Freq: Every day | ORAL | Status: DC
  Administered 2018-08-18 – 2018-08-19 (×2): 75 mg via ORAL
  Filled 2018-08-18 (×2): qty 2

## 2018-08-18 MED ORDER — FUROSEMIDE 40 MG PO TABS
40.0000 mg | ORAL_TABLET | Freq: Two times a day (BID) | ORAL | Status: DC
  Administered 2018-08-18 – 2018-08-20 (×4): 40 mg via ORAL
  Filled 2018-08-18 (×4): qty 1

## 2018-08-18 MED ORDER — POLYETHYLENE GLYCOL 3350 17 G PO PACK
17.0000 g | PACK | Freq: Two times a day (BID) | ORAL | Status: DC
  Administered 2018-08-18: 17 g via ORAL
  Filled 2018-08-18 (×4): qty 1
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
## 2018-08-18 NOTE — Telephone Encounter (Signed)
Patient called requesting a call back, patient is currently admitted. Please follow up.

## 2018-08-18 NOTE — Hospital Discharge Follow-Up (Signed)
The patient is well known to the [facility]. Met with him this afternoon. He said that he is currently staying with a friend and will plan to return there after discharge. He explained that he has an interview for an apartment at [facility] on 09/07/18 and hopes to be able to be able to move into his own apartment after that. the rent is $299/month.   He said that he was supposed to be interviewed this week but missed the appointment due to the hospitalization.   He noted that his disability back pay should be paid out this month and he may consider getting a motel room if he needs to leave his friend's home and is not yet approved for [employer].  He spoke about the less than optimal living conditions of places that he stayed in the past noting that he is surrounded by drugs and alcohol.  He said that he is ready to change and commit to abstaining from both drugs and alcohol.   He also receives food stamps.  He said that he currently does not have a phone but plans to get a phone on Friday, 08/20/18. Instructed him to contact this CM with the new phone #.  A follow up appointment at [facility] has been scheduled for 09/01/18 @ 0950 and the information was placed on the AVS.
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
## 2018-08-18 NOTE — Progress Notes (Signed)
Nutrition Brief Note  Patient identified on the Malnutrition Screening Tool (MST) Report.   Per readings below, no recent significant weight loss.  Wt Readings from Last 15 Encounters:  08/18/18 87.6 kg  07/27/18 80.5 kg  07/20/18 86.2 kg  05/01/18 92.4 kg  03/23/18 90.4 kg  03/18/18 88 kg  02/19/18 85.3 kg  02/19/18 85.5 kg  02/16/18 92.1 kg  01/29/18 92.1 kg  12/16/17 89.4 kg  11/15/17 89.4 kg  11/11/17 89.7 kg  09/01/17 91.4 kg  08/07/17 91.4 kg   Body mass index is 26.95 kg/m. Patient meets criteria for Overweight based on current BMI.   Current diet order is Heart Healthy/Carbohydrate Modified, patient is consuming approximately 100% of meals at this time. Labs and medications reviewed.   No nutrition interventions warranted at this time. If nutrition issues arise, please consult RD.   Arthur Holms, RD, LDN Pager #: [phone] After-Hours Pager #: [phone]

## 2018-08-19 LAB — BASIC METABOLIC PANEL
Anion gap: 12 (ref 5–15)
BUN: 20 mg/dL (ref 6–20)
CO2: 25 mmol/L (ref 22–32)
Calcium: 8.5 mg/dL — ABNORMAL LOW (ref 8.9–10.3)
Chloride: 89 mmol/L — ABNORMAL LOW (ref 98–111)
Creatinine, Ser: 1.77 mg/dL — ABNORMAL HIGH (ref 0.61–1.24)
GFR calc Af Amer: 47 mL/min — ABNORMAL LOW (ref >60)
GFR calc non Af Amer: 41 mL/min — ABNORMAL LOW (ref >60)
Glucose, Bld: 169 mg/dL — ABNORMAL HIGH (ref 70–99)
Potassium: 3.7 mmol/L (ref 3.5–5.1)
Sodium: 126 mmol/L — ABNORMAL LOW (ref 135–145)

## 2018-08-19 LAB — GLUCOSE, CAPILLARY
Glucose-Capillary: 178 mg/dL — ABNORMAL HIGH (ref 70–99)
Glucose-Capillary: 202 mg/dL — ABNORMAL HIGH (ref 70–99)
Glucose-Capillary: 235 mg/dL — ABNORMAL HIGH (ref 70–99)
Glucose-Capillary: 168 mg/dL — ABNORMAL HIGH (ref 70–99)
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
## 2018-08-20 ENCOUNTER — Inpatient Hospital Stay (HOSPITAL_COMMUNITY): Payer: Medicaid Other

## 2018-08-20 DIAGNOSIS — N184 Chronic kidney disease, stage 4 (severe): Secondary | ICD-10-CM

## 2018-08-20 LAB — GLUCOSE, CAPILLARY
Glucose-Capillary: 142 mg/dL — ABNORMAL HIGH (ref 70–99)
Glucose-Capillary: 162 mg/dL — ABNORMAL HIGH (ref 70–99)
Glucose-Capillary: 178 mg/dL — ABNORMAL HIGH (ref 70–99)

## 2018-08-20 LAB — CREATININE, URINE, RANDOM: Creatinine, Urine: 93.87 mg/dL

## 2018-08-20 LAB — CORTISOL: Cortisol, Plasma: 15.5 ug/dL

## 2018-08-20 LAB — BASIC METABOLIC PANEL
Anion gap: 11 (ref 5–15)
BUN: 27 mg/dL — ABNORMAL HIGH (ref 6–20)
CO2: 24 mmol/L (ref 22–32)
Calcium: 8.6 mg/dL — ABNORMAL LOW (ref 8.9–10.3)
Chloride: 89 mmol/L — ABNORMAL LOW (ref 98–111)
Creatinine, Ser: 2.04 mg/dL — ABNORMAL HIGH (ref 0.61–1.24)
GFR calc Af Amer: 40 mL/min — ABNORMAL LOW (ref >60)
GFR calc non Af Amer: 34 mL/min — ABNORMAL LOW (ref >60)
Glucose, Bld: 160 mg/dL — ABNORMAL HIGH (ref 70–99)
Potassium: 4.2 mmol/L (ref 3.5–5.1)
Sodium: 124 mmol/L — ABNORMAL LOW (ref 135–145)

## 2018-08-20 LAB — BRAIN NATRIURETIC PEPTIDE: B Natriuretic Peptide: 757.1 pg/mL — ABNORMAL HIGH (ref 0.0–100.0)

## 2018-08-20 NOTE — Significant Event (Signed)
Primary team was not paged that patient was leaving AMA.  Cardiology had recommended that patient stay for further work-up as creatinine had increased.  His diuretic regimen needed to be modified.  The primary team agreed with cardiology.  Patient had a follow up BMP scheduled for 8/31 AM.  Nurse had paged today stating that patient was inquiring about why he was not being discharged.  Explained to the nurse that this patient needed to stay for the reasons above.  She stated that she would explain this to the patient.  No further communication was made.  [place] Constable, D.O.  PGY-1 Family Medicine  08/20/2018 7:40 PM

## 2018-08-20 NOTE — Progress Notes (Signed)
Pt states he wants to leave AMA. Paper work Visual merchandiser. IV's removed. Pt's friend at bedside giving pt ride to [facility]. Pt ambulated with cane. Jerald Kief, RN

## 2018-08-20 NOTE — Progress Notes (Signed)
Family Medicine Teaching Service Daily Progress Note Intern Pager: [phone]  Patient name: Brad [name] Medical record number: [id number] Date of birth: March 27, 1961 Age: 57 y.o. Gender: male  Primary Care Provider: Charlott Rakes, MD Consultants: Cardiology, electrophysiology Code Status: FULL  Pt Overview and Major Events to Date:  Admit 8/26   Assessment and Plan: Yardley Lekas is a 57 y.o. male presenting with AMS, frequent non-sustained VT, and hyponatremia. PMH is significant for chronic hyponatremia, atrial fibrillation/flutter, HFrEF, cocaine use, diabetes, hypertension, GERD, and liver cirrhosis.  AMS in setting of head injury and intoxication: resolved. Alert and oriented. Patient states Norco q6H and tylenol have improved his head lesion pain. -Norco q6H for pain -gabapentin at renal dose -monitor mental status   Shortness of breath. Resolved. Holding lasix today due to increased Cr -continue Diureses with Lasix 40mg  BID PO after am labs -continue spironolactone 25mg  daily -monitor resp status  Hyponatremia, in setting of chronic hyponatremia:  124 today is asymptomatic  -PO lasix 40 BID -continue spironolactone 25mg  daily -monitor on BMET daily  Frequent Non-sustained V Tach: No events in over 24 hours -f/u Cardiology recs -continue home coreg at 6.25mg  BID -continue spirinolactone  -hold PO Lasix in setting of rising Cr until downtrends  Atrial Fibrillation/Flutter: Stable.On Eliquis, coreg 6.25 BID. Rate controlled. no evidence of internal bleeding. Not a candidate for ICD placement due to continued cocaine use per cardiology.  -Cardiology following -continue rate control -Cont home Eliquis -continue home coreg 6.25mg  BID -continue spironolactone daily -encourage EtOH cessation  HFrEF: Echo 07/2018 EF 20-25%, G3DD, and pulmonary hypertension. -Cardiology following as above   -Daily wts; I&O's   Substance abuse:  UDS+, Counseled patient on  cessation -Thiamine, folate, and multivitamin replenished  CKD III: Cr gradual increase. 2.04, BUN 27 today with GFR 40. Pre-renal azotemia.  -hold PO Lasix for 1 day and recheck BMP -Monitor BMP   Liver Cirrhosis: Stable.  CT abdomen showing liver cirrhosis.  -Avoid hepatotoxic medications  -CMP in am     DM, Type 2, with diabetic neuropathy: Stable.  Last A1c 7.6 in 07/2018.  Lantus 30 units BID and gabapentin 300mg  BID at home. Did not require his home Lantus during last admission with appropriate glucose control. Consider addition of Jardiance on discharge depending on insurance.  - holding home lantus for now -sSSI -Monitor glucose -Hold home gabapentin d/t AMS    Hypertension: Stable. Currently normotensive.Takes carvedilol 6.25mg  BID and amlodipine 10mg  at home for control.  -continue home coreg -continue spironolactone -PO Lasix held today for increasing Cr  Depression: Not endorsing current symptoms.  -Hold home amtriptyline d/t AMS  Tobacco use: 1/2 PPD. Patient endorses desire to quit -Nicotine patch if needed  GERD: Stable.  No current symptoms.  -Cont to monitor   FEN/GI: heart healthy/carb modified diet  Prophylaxis: Home Eliquis   Disposition: dispo probable tomorrow  Subjective:  Overnight: no acute events Today: Patient remains ORA appears comfortable.  No CP, SOB.  Objective: Temp:  [98.2 F (36.8 C)-98.4 F (36.9 C)] 98.2 F (36.8 C) (08/30 0801) Pulse Rate:  [71-88] 88 (08/30 0801) Resp:  [16-21] 16 (08/30 0417) BP: (91-144)/(59-90) 130/88 (08/30 0801) SpO2:  [92 %-97 %] 97 % (08/30 0801) Weight:  [89.7 kg] 89.7 kg (08/30 0417)   Physical Exam:  General: alert, oriented x3, NAD   HEENT: posterior scalp laceration with staples in place. Dressing has dried blood but no active bleeding noted- swelling has improved, R eye ecchymosis present Cardiac:regular rate, regular rhythm,  no murmer appreciated  Lungs: CTAB, no crackles appreciated   Abdomen: soft, non-tender, non-distended, normoactive BS Ext: Warm, dry, 2+ distal pulses, no edema  Neuro: alert and Oriented x3  Laboratory: Recent Labs  Lab 08/15/18 2351 08/16/18 0017 08/17/18 0438 08/18/18 0331  WBC 6.0  --  7.1 7.4  HGB 11.9* 13.3 11.2* 11.6*  HCT 35.1* 39.0 33.4* 33.8*  PLT 147*  --  153 142*   Recent Labs  Lab 08/15/18 2351  08/17/18 0206 08/18/18 0331 08/19/18 0948 08/20/18 0328  NA 122*   < > 128* 127* 126* 124*  K 4.1   < > 4.1 3.8 3.7 4.2  CL 88*   < > 93* 91* 89* 89*  CO2 23   < > 25 26 25 24   BUN 15   < > 13 16 20  27*  CREATININE 1.61*   < > 1.49* 1.72* 1.77* 2.04*  CALCIUM 8.7*   < > 8.8* 8.6* 8.5* 8.6*  PROT 7.4  --  7.2 7.2  --   --   BILITOT 1.2  --  1.0 1.1  --   --   ALKPHOS 60  --  57 58  --   --   ALT 15  --  13 14  --   --   AST 23  --  21 20  --   --   GLUCOSE 140*   < > 156* 168* 169* 160*   < > = values in this interval not displayed.    Imaging/Diagnostic Tests: Dg Chest 2 View  Result Date: 08/20/2018 CLINICAL DATA:  57 year old male with dry cough, shortness of breath and midline chest pain EXAM: CHEST - 2 VIEW COMPARISON:  Prior chest x-ray 08/17/2018 FINDINGS: Overall significantly improved aeration with resolution of pulmonary edema. However, there is persistent patchy airspace opacity in the right upper lung which may represent pneumonia. Slightly improved cardiomegaly. Stable mediastinal contours. Atherosclerotic calcifications present in the transverse aorta. Small bilateral, left larger than right, layering pleural effusions and associated bibasilar atelectasis. No evidence of pneumothorax. No acute osseous abnormality. IMPRESSION: 1. Persistent patchy airspace opacity in the right upper lung concerning for underlying bronchopneumonia. 2. Otherwise, significantly improved aeration bilaterally with resolution of pulmonary edema. 3. Persistent small bilateral, left slightly larger than right, layering pleural effusions and  associated bibasilar atelectasis. Electronically Signed   By: Jacqulynn Cadet M.D.   On: 08/20/2018 13:41   Richarda Osmond, DO 08/20/2018, 4:30 PM PGY-1, [place] Intern pager: [phone], text pages welcome

## 2018-08-20 NOTE — Progress Notes (Signed)
Progress Note  Patient Name: Brad [name] Date of Encounter: 08/20/2018  Primary Cardiologist: No primary care provider on file.   Subjective   Complaining about the quality of the food.  No shortness of breath.  Inpatient Medications    Scheduled Meds: . amitriptyline  75 mg Oral QHS  . apixaban  5 mg Oral BID  . carvedilol  6.25 mg Oral BID WC  . folic acid  1 mg Oral Daily  . furosemide  40 mg Oral BID  . gabapentin  300 mg Oral BID  . insulin aspart  0-9 Units Subcutaneous TID WC  . multivitamin with minerals  1 tablet Oral Daily  . polyethylene glycol  17 g Oral BID  . spironolactone  25 mg Oral Daily  . thiamine  100 mg Oral Daily   Or  . thiamine  100 mg Intravenous Daily   Continuous Infusions:  PRN Meds: acetaminophen **OR** acetaminophen, HYDROcodone-acetaminophen   Vital Signs    Vitals:   08/19/18 1720 08/19/18 1950 08/20/18 0417 08/20/18 0801  BP: (!) 144/84 134/90 (!) 91/59 130/88  Pulse: 86 71 71 88  Resp:  (!) 21 16   Temp:  98.4 F (36.9 C) 98.2 F (36.8 C) 98.2 F (36.8 C)  TempSrc:  Oral Oral Oral  SpO2:  92% 94% 97%  Weight:   89.7 kg   Height:        Intake/Output Summary (Last 24 hours) at 08/20/2018 1342 Last data filed at 08/20/2018 0900 Gross per 24 hour  Intake 840 ml  Output -  Net 840 ml   Filed Weights   08/18/18 0401 08/19/18 0445 08/20/18 0417  Weight: 87.6 kg 88.2 kg 89.7 kg    Telemetry    Atrial flutter with controlled ventricular response.- Personally Reviewed  ECG    No new tracing- Personally Reviewed  Physical Exam  Comfortable in no distress.   Neck: No JVD Cardiac:  Rhythm without significant murmur.Marland Kitchen  Respiratory:  Anterior auscultation reveals clear lung fields. GI:  Soft and nontender. MS:  No ankle edema. Neuro:  Nonfocal  Psych: Normal affect   Labs    Chemistry Recent Labs  Lab 08/15/18 2351  08/17/18 0206 08/18/18 0331 08/19/18 0948 08/20/18 0328  NA 122*   < > 128* 127* 126*  124*  K 4.1   < > 4.1 3.8 3.7 4.2  CL 88*   < > 93* 91* 89* 89*  CO2 23   < > _0 GLUCOSE 140*   < > 156* 168* 169* 160*  BUN 15   < > _1 27*  CREATININE 1.61*   < > 1.49* 1.72* 1.77* 2.04*  CALCIUM 8.7*   < > 8.8* 8.6* 8.5* 8.6*  PROT 7.4  --  7.2 7.2  --   --   ALBUMIN 3.2*  --  3.0* 2.9*  --   --   AST 23  --  21 20  --   --   ALT 15  --  13 14  --   --   ALKPHOS 60  --  57 58  --   --   BILITOT 1.2  --  1.0 1.1  --   --   GFRNONAA 46*   < > 50* 42* 41* 34*  GFRAA 53*   < > 58* 49* 47* 40*  ANIONGAP 11   < > _2 < > = values in this interval  not displayed.     Hematology Recent Labs  Lab 08/15/18 2351 08/16/18 0017 08/17/18 0438 08/18/18 0331  WBC 6.0  --  7.1 7.4  RBC 3.71*  --  3.51* 3.53*  HGB 11.9* 13.3 11.2* 11.6*  HCT 35.1* 39.0 33.4* 33.8*  MCV 94.6  --  95.2 95.8  MCH 32.1  --  31.9 32.9  MCHC 33.9  --  33.5 34.3  RDW 11.9  --  12.2 12.3  PLT 147*  --  153 142*    Cardiac Enzymes Recent Labs  Lab 08/15/18 2351  TROPONINI 0.04*   No results for input(s): TROPIPOC in the last 168 hours.   BNPNo results for input(s): BNP, PROBNP in the last 168 hours.   DDimer No results for input(s): DDIMER in the last 168 hours.   Radiology    No results found.  Cardiac Studies   No new data  Patient Profile     57 y.o. male history of atrial fibrillation/flutter, chronic anticoagulation with Eliquis, nonischemic cardiomyopathy EF less than 25%, coronary angios with nonobstructive disease 2017, IV drug use, alcohol abuse, chronic kidney disease stage III, hepatitis C, diabetes type 2 with complications, hyperlipidemia, hypertension, tobacco use, who was admitted with altered mental status.  By our cardiology practice, most recently Dr. Mali Hilty.  Assessment & Plan    1. Chronic systolic heart failure: He is allergic to inhibitor therapy and because of that ARB has been held.  Has been treated with hydralazine/nitrates, beta-blocker  therapy, and diuretic therapy.  Daily weights and strict INO needs to be recorded. 2. Chronic atrial fibrillation: Recommendation has been for chronic anticoagulation.  Has been on apixaban as OP. 3. Chronic anticoagulation as an outpatient 4. CKD stage III: Upward trend in creatinine overnight.  Will hold diuretic therapy for the time being.  Will need to reconsider dose in a.m. after lab results are known.  5. Alcohol abuse     Overall plan today is to put all diuretics on hold as creatinine has slightly bumped.  He was only on 20 mg of furosemide daily at home.  Diuretic regimen needs to be modified, and perhaps Spironolactone along will suffice to keep him volume neutral.  Spironolactone will be important since he is not on renin/angiotensin system blockade.  Be met in BNP ordered for tomorrow morning.      For questions or updates, please contact [place] Please consult [email or web address] for contact info under Cardiology/STEMI.      Signed, Sinclair Grooms, MD  08/20/2018, 1:42 PM

## 2018-08-20 NOTE — Progress Notes (Signed)
Family medicine paged due to pt wanting to be discharge. Informed pt that cardiology would like to see pt. Pt is set on leaving today before 6 pm. Pt advises he will sign at [facility]. Jerald Kief
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
## 2018-08-20 NOTE — Care Management Note (Signed)
Case Management Note  Patient Details  Name: Brad [name] MRN: [id number] Date of Birth: 1961-07-18  Subjective/Objective: 57 y.o.malepresenting with AMS, frequent non-sustained VT, and hyponatremia. PMH is significant forchronic hyponatremia, atrial fibrillation/flutter,HFrEF,cocaine use, diabetes, hypertension, GERD, and liver cirrhosis.                  Action/Plan: CM met patient for transitional needs. Patient lives at home with a friend and was independent with ADLs PTA. PT eval with HHPT/RW and supervision for mobility recommended. Patient confirmed local PCP: Dr. Margarita Rana; Local Pharmacy: [facility], [facility]. Patient indicated transitioning home with his friend, with friend available to provide assistance. Patient agreeable to HHPT, but prefers to ambulate with a cane vs the RW.  HH/DME choice list provided, with no preference. HHPT referral given to [facility] liaison; DME referral given to [place], [facility], DME liaison. Patient verbalized needing bus tickets for transport to his pharmacy then home, d/t no family/friends  available today to assist. Bus tickets to be provided to patient. No further needs at this time.   Expected Discharge Date:                  Expected Discharge Plan:  Home/Self Care  In-House Referral:  NA  Discharge planning Services  CM Consult  Post Acute Care Choice:  Durable Medical Equipment, Home Health Choice offered to:  Patient  DME Arranged:  Kasandra Knudsen DME Agency:  [place]:  PT [facility] Agency:  [place]  Status of Service:  Completed, signed off  If discussed at [place] of Stay Meetings, dates discussed:    Additional Comments:  Midge Minium RN, BSN, NCM-BC, ACM-RN [phone] 08/20/2018, 11:35 AM
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
## 2018-08-21 LAB — UREA NITROGEN, URINE: Urea Nitrogen, Ur: 397 mg/dL

## 2018-08-24 ENCOUNTER — Telehealth: Payer: Self-pay

## 2018-08-24 ENCOUNTER — Telehealth: Payer: Self-pay | Admitting: Family Medicine

## 2018-08-24 NOTE — Telephone Encounter (Signed)
PT was call and informed to contact office for Verbal orders.

## 2018-08-24 NOTE — Telephone Encounter (Signed)
Physical therapist shoemaker called to request verbal orders for physical therapy 2 week-2 1week-2 Please follow up [phone]

## 2018-08-24 NOTE — Telephone Encounter (Signed)
Transition Care Management Follow-up Telephone Call  Date of discharge and from where:  08/20/2018 [facility].  Left AMA  How have you been since you were released from the hospital? " feeling slow"    Any questions or concerns? He did not report any questions/concerns. His only question was " why did I have to stay at the hospital."   Items Reviewed:  Did the pt receive and understand the discharge instructions provided? No instructions received/no AVS, left AMA  Medications obtained and verified? No prescriptions received by patient as he left AMA . No AVS with instructions.  Said that he has " some" medication including gabapentin, insulin, clonidine but was not able to state all that he had at home. He said that he wants his prescriptions to be sent to [facility]. Informed him that Dr Margarita Rana would be notified of the status of his medications   Any new allergies since your discharge? None reported   Do you have support at home?  He is back living with a friend at [facility]. He noted that he has the interview for [facility] on 09/07/18 and hopes to soon have his own apartment.   Other (ie: DME, Home Health, etc) He stated that he received a cane at discharge.  He also explained that the PT from [facility] saw him today and will plan to make one more home visit.   Functional Questionnaire: (I = Independent and D = Dependent) ADL's:   Bathing/Dressing- I   Meal Prep- I  Eating- I  Maintaining continence- I  Transferring/Ambulation- I ambulation with cane  Managing Meds- I    Follow up appointments reviewed:    PCP Hospital f/u appt confirmed? Yes  Scheduled to see Jerral Bonito on 09/01/18 @ 0950.    [facility] f/u appt confirmed?  He did not receive an AVS with instructions.   Are transportation arrangements needed? He may need transportation to the clinic - cab , bus pass  If their condition worsens, is the pt aware to call  their PCP or go to  the ED? Patient is aware of when to return to ED or call PCP.    Was the patient provided with contact information for the PCP's office or ED? He has the contact # for [facility].   Message sent to Dr Margarita Rana regarding the status of patient's medication.

## 2018-08-25 ENCOUNTER — Telehealth: Payer: Self-pay

## 2018-08-25 NOTE — Telephone Encounter (Signed)
Call received from patient inquiring when he can have the staples removed from his head.  Informed him that Dr Margarita Rana will be notified of his request.  His appointment with Dr Margarita Rana is 09/01/18.    Attempted to call patient back to inform him that there are not any appointments available with Dr Margarita Rana before 09/01/18. Call placed to # [phone] multiple times and the phone rang busy or the message stated that the call could not be completed at this time.

## 2018-08-26 ENCOUNTER — Emergency Department (HOSPITAL_COMMUNITY): Payer: Medicaid Other

## 2018-08-26 ENCOUNTER — Other Ambulatory Visit: Payer: Self-pay

## 2018-08-26 ENCOUNTER — Encounter (HOSPITAL_COMMUNITY): Payer: Self-pay | Admitting: Emergency Medicine

## 2018-08-26 ENCOUNTER — Inpatient Hospital Stay (HOSPITAL_COMMUNITY)
Admission: EM | Admit: 2018-08-26 | Discharge: 2018-09-01 | DRG: 291 | Disposition: A | Discharge status: Home Health | Payer: Medicaid Other | Attending: Family Medicine | Admitting: Family Medicine

## 2018-08-26 ENCOUNTER — Telehealth: Payer: Self-pay | Admitting: Family Medicine

## 2018-08-26 DIAGNOSIS — F1120 Opioid dependence, uncomplicated: Secondary | ICD-10-CM | POA: Diagnosis present

## 2018-08-26 DIAGNOSIS — I2510 Atherosclerotic heart disease of native coronary artery without angina pectoris: Secondary | ICD-10-CM | POA: Diagnosis present

## 2018-08-26 DIAGNOSIS — F1410 Cocaine abuse, uncomplicated: Secondary | ICD-10-CM | POA: Diagnosis present

## 2018-08-26 DIAGNOSIS — K7460 Unspecified cirrhosis of liver: Secondary | ICD-10-CM | POA: Diagnosis present

## 2018-08-26 DIAGNOSIS — E1122 Type 2 diabetes mellitus with diabetic chronic kidney disease: Secondary | ICD-10-CM | POA: Diagnosis present

## 2018-08-26 DIAGNOSIS — Z23 Encounter for immunization: Secondary | ICD-10-CM

## 2018-08-26 DIAGNOSIS — F1010 Alcohol abuse, uncomplicated: Secondary | ICD-10-CM | POA: Diagnosis present

## 2018-08-26 DIAGNOSIS — B182 Chronic viral hepatitis C: Secondary | ICD-10-CM | POA: Diagnosis present

## 2018-08-26 DIAGNOSIS — F17210 Nicotine dependence, cigarettes, uncomplicated: Secondary | ICD-10-CM | POA: Diagnosis present

## 2018-08-26 DIAGNOSIS — I4892 Unspecified atrial flutter: Secondary | ICD-10-CM | POA: Diagnosis present

## 2018-08-26 DIAGNOSIS — F329 Major depressive disorder, single episode, unspecified: Secondary | ICD-10-CM | POA: Diagnosis present

## 2018-08-26 DIAGNOSIS — R0602 Shortness of breath: Secondary | ICD-10-CM | POA: Diagnosis present

## 2018-08-26 DIAGNOSIS — E871 Hypo-osmolality and hyponatremia: Secondary | ICD-10-CM

## 2018-08-26 DIAGNOSIS — I5023 Acute on chronic systolic (congestive) heart failure: Secondary | ICD-10-CM

## 2018-08-26 DIAGNOSIS — Z9119 Patient's noncompliance with other medical treatment and regimen: Secondary | ICD-10-CM

## 2018-08-26 DIAGNOSIS — Z9114 Patient's other noncompliance with medication regimen: Secondary | ICD-10-CM

## 2018-08-26 DIAGNOSIS — E1140 Type 2 diabetes mellitus with diabetic neuropathy, unspecified: Secondary | ICD-10-CM | POA: Diagnosis present

## 2018-08-26 DIAGNOSIS — I130 Hypertensive heart and chronic kidney disease with heart failure and stage 1 through stage 4 chronic kidney disease, or unspecified chronic kidney disease: Principal | ICD-10-CM | POA: Diagnosis present

## 2018-08-26 DIAGNOSIS — I083 Combined rheumatic disorders of mitral, aortic and tricuspid valves: Secondary | ICD-10-CM | POA: Diagnosis present

## 2018-08-26 DIAGNOSIS — N183 Chronic kidney disease, stage 3 (moderate): Secondary | ICD-10-CM | POA: Diagnosis present

## 2018-08-26 DIAGNOSIS — I5021 Acute systolic (congestive) heart failure: Secondary | ICD-10-CM

## 2018-08-26 DIAGNOSIS — J9811 Atelectasis: Secondary | ICD-10-CM | POA: Diagnosis present

## 2018-08-26 DIAGNOSIS — E7800 Pure hypercholesterolemia, unspecified: Secondary | ICD-10-CM | POA: Diagnosis present

## 2018-08-26 DIAGNOSIS — I447 Left bundle-branch block, unspecified: Secondary | ICD-10-CM | POA: Diagnosis present

## 2018-08-26 DIAGNOSIS — M1990 Unspecified osteoarthritis, unspecified site: Secondary | ICD-10-CM | POA: Diagnosis present

## 2018-08-26 DIAGNOSIS — I5043 Acute on chronic combined systolic (congestive) and diastolic (congestive) heart failure: Secondary | ICD-10-CM | POA: Diagnosis present

## 2018-08-26 DIAGNOSIS — K7030 Alcoholic cirrhosis of liver without ascites: Secondary | ICD-10-CM | POA: Diagnosis not present

## 2018-08-26 DIAGNOSIS — Z888 Allergy status to other drugs, medicaments and biological substances status: Secondary | ICD-10-CM

## 2018-08-26 DIAGNOSIS — I428 Other cardiomyopathies: Secondary | ICD-10-CM | POA: Diagnosis present

## 2018-08-26 DIAGNOSIS — E118 Type 2 diabetes mellitus with unspecified complications: Secondary | ICD-10-CM

## 2018-08-26 DIAGNOSIS — Z794 Long term (current) use of insulin: Secondary | ICD-10-CM

## 2018-08-26 DIAGNOSIS — I482 Chronic atrial fibrillation, unspecified: Secondary | ICD-10-CM

## 2018-08-26 DIAGNOSIS — K219 Gastro-esophageal reflux disease without esophagitis: Secondary | ICD-10-CM | POA: Diagnosis present

## 2018-08-26 DIAGNOSIS — E119 Type 2 diabetes mellitus without complications: Secondary | ICD-10-CM

## 2018-08-26 DIAGNOSIS — Z8673 Personal history of transient ischemic attack (TIA), and cerebral infarction without residual deficits: Secondary | ICD-10-CM

## 2018-08-26 DIAGNOSIS — I10 Essential (primary) hypertension: Secondary | ICD-10-CM | POA: Diagnosis not present

## 2018-08-26 DIAGNOSIS — R140 Abdominal distension (gaseous): Secondary | ICD-10-CM | POA: Diagnosis not present

## 2018-08-26 DIAGNOSIS — Z79899 Other long term (current) drug therapy: Secondary | ICD-10-CM

## 2018-08-26 DIAGNOSIS — E876 Hypokalemia: Secondary | ICD-10-CM | POA: Diagnosis present

## 2018-08-26 DIAGNOSIS — R079 Chest pain, unspecified: Secondary | ICD-10-CM | POA: Diagnosis present

## 2018-08-26 DIAGNOSIS — I252 Old myocardial infarction: Secondary | ICD-10-CM

## 2018-08-26 DIAGNOSIS — Z8249 Family history of ischemic heart disease and other diseases of the circulatory system: Secondary | ICD-10-CM

## 2018-08-26 DIAGNOSIS — Z7901 Long term (current) use of anticoagulants: Secondary | ICD-10-CM

## 2018-08-26 DIAGNOSIS — Z96652 Presence of left artificial knee joint: Secondary | ICD-10-CM | POA: Diagnosis present

## 2018-08-26 LAB — CBC WITH DIFFERENTIAL/PLATELET
Abs Immature Granulocytes: 0 10*3/uL (ref 0.0–0.1)
Basophils Absolute: 0.1 10*3/uL (ref 0.0–0.1)
Basophils Relative: 2 %
Eosinophils Absolute: 0.1 10*3/uL (ref 0.0–0.7)
Eosinophils Relative: 2 %
HCT: 30.2 % — ABNORMAL LOW (ref 39.0–52.0)
Hemoglobin: 10.3 g/dL — ABNORMAL LOW (ref 13.0–17.0)
Immature Granulocytes: 0 %
Lymphocytes Relative: 28 %
Lymphs Abs: 1.7 10*3/uL (ref 0.7–4.0)
MCH: 31.8 pg (ref 26.0–34.0)
MCHC: 34.1 g/dL (ref 30.0–36.0)
MCV: 93.2 fL (ref 78.0–100.0)
Monocytes Absolute: 0.6 10*3/uL (ref 0.1–1.0)
Monocytes Relative: 10 %
Neutro Abs: 3.6 10*3/uL (ref 1.7–7.7)
Neutrophils Relative %: 58 %
Platelets: 265 10*3/uL (ref 150–400)
RBC: 3.24 MIL/uL — ABNORMAL LOW (ref 4.22–5.81)
RDW: 12.1 % (ref 11.5–15.5)
WBC: 6.2 10*3/uL (ref 4.0–10.5)

## 2018-08-26 LAB — RAPID URINE DRUG SCREEN, HOSP PERFORMED
Amphetamines: NOT DETECTED
Barbiturates: NOT DETECTED
Benzodiazepines: NOT DETECTED
Cocaine: POSITIVE — AB
Opiates: NOT DETECTED
Tetrahydrocannabinol: NOT DETECTED

## 2018-08-26 LAB — ETHANOL: Alcohol, Ethyl (B): <10 mg/dL (ref <10)

## 2018-08-26 LAB — COMPREHENSIVE METABOLIC PANEL
ALT: 16 U/L (ref 0–44)
AST: 23 U/L (ref 15–41)
Albumin: 2.8 g/dL — ABNORMAL LOW (ref 3.5–5.0)
Alkaline Phosphatase: 53 U/L (ref 38–126)
Anion gap: 11 (ref 5–15)
BUN: 14 mg/dL (ref 6–20)
CO2: 23 mmol/L (ref 22–32)
Calcium: 8.5 mg/dL — ABNORMAL LOW (ref 8.9–10.3)
Chloride: 93 mmol/L — ABNORMAL LOW (ref 98–111)
Creatinine, Ser: 1.5 mg/dL — ABNORMAL HIGH (ref 0.61–1.24)
GFR calc Af Amer: 58 mL/min — ABNORMAL LOW (ref >60)
GFR calc non Af Amer: 50 mL/min — ABNORMAL LOW (ref >60)
Glucose, Bld: 155 mg/dL — ABNORMAL HIGH (ref 70–99)
Potassium: 3.4 mmol/L — ABNORMAL LOW (ref 3.5–5.1)
Sodium: 127 mmol/L — ABNORMAL LOW (ref 135–145)
Total Bilirubin: 1 mg/dL (ref 0.3–1.2)
Total Protein: 7.5 g/dL (ref 6.5–8.1)

## 2018-08-26 LAB — TROPONIN I
Troponin I: 0.03 ng/mL (ref <0.03)
Troponin I: 0.03 ng/mL (ref <0.03)
Troponin I: 0.04 ng/mL (ref <0.03)
Troponin I: 0.03 ng/mL (ref <0.03)

## 2018-08-26 LAB — BRAIN NATRIURETIC PEPTIDE: B Natriuretic Peptide: 953.7 pg/mL — ABNORMAL HIGH (ref 0.0–100.0)

## 2018-08-26 LAB — PROTIME-INR
INR: 1.44
Prothrombin Time: 17.4 seconds — ABNORMAL HIGH (ref 11.4–15.2)

## 2018-08-26 LAB — CBG MONITORING, ED: Glucose-Capillary: 191 mg/dL — ABNORMAL HIGH (ref 70–99)

## 2018-08-26 MED ORDER — SODIUM CHLORIDE 0.9 % IV SOLN: 250.0000 mL | INTRAVENOUS | Status: DC | PRN

## 2018-08-26 MED ORDER — LORAZEPAM 1 MG PO TABS
1.0000 mg | ORAL_TABLET | Freq: Four times a day (QID) | ORAL | Status: DC | PRN
  Administered 2018-08-26 – 2018-08-28 (×3): 1 mg via ORAL
  Filled 2018-08-26 (×3): qty 1

## 2018-08-26 MED ORDER — INSULIN ASPART 100 UNIT/ML ~~LOC~~ SOLN
0.0000 [IU] | Freq: Three times a day (TID) | SUBCUTANEOUS | Status: DC
  Administered 2018-08-26: 2 [IU] via SUBCUTANEOUS
  Administered 2018-08-27: 5 [IU] via SUBCUTANEOUS
  Administered 2018-08-27 (×2): 2 [IU] via SUBCUTANEOUS
  Administered 2018-08-28: 3 [IU] via SUBCUTANEOUS
  Administered 2018-08-28: 2 [IU] via SUBCUTANEOUS
  Administered 2018-08-28: 1 [IU] via SUBCUTANEOUS
  Administered 2018-08-29: 3 [IU] via SUBCUTANEOUS
  Administered 2018-08-29: 2 [IU] via SUBCUTANEOUS
  Administered 2018-08-29: 1 [IU] via SUBCUTANEOUS
  Administered 2018-08-30 (×2): 2 [IU] via SUBCUTANEOUS
  Administered 2018-08-30: 1 [IU] via SUBCUTANEOUS
  Administered 2018-08-31 (×2): 2 [IU] via SUBCUTANEOUS
  Administered 2018-08-31: 1 [IU] via SUBCUTANEOUS
  Administered 2018-09-01 (×2): 2 [IU] via SUBCUTANEOUS
  Filled 2018-08-26: qty 1

## 2018-08-26 MED ORDER — VITAMIN B-1 100 MG PO TABS
100.0000 mg | ORAL_TABLET | Freq: Every day | ORAL | Status: DC
  Administered 2018-08-26 – 2018-09-01 (×7): 100 mg via ORAL
  Filled 2018-08-26 (×7): qty 1

## 2018-08-26 MED ORDER — SODIUM CHLORIDE 0.9% FLUSH: 3.0000 mL | INTRAVENOUS | Status: DC | PRN

## 2018-08-26 MED ORDER — CLONIDINE HCL 0.1 MG PO TABS
0.1000 mg | ORAL_TABLET | Freq: Two times a day (BID) | ORAL | Status: DC
  Administered 2018-08-26 – 2018-08-27 (×2): 0.1 mg via ORAL
  Filled 2018-08-26 (×2): qty 1

## 2018-08-26 MED ORDER — LORAZEPAM 2 MG/ML IJ SOLN: 1.0000 mg | Freq: Four times a day (QID) | INTRAMUSCULAR | Status: DC | PRN

## 2018-08-26 MED ORDER — ADULT MULTIVITAMIN W/MINERALS CH
1.0000 | ORAL_TABLET | Freq: Every day | ORAL | Status: DC
  Administered 2018-08-26 – 2018-09-01 (×7): 1 via ORAL
  Filled 2018-08-26 (×7): qty 1

## 2018-08-26 MED ORDER — POTASSIUM CHLORIDE CRYS ER 20 MEQ PO TBCR
40.0000 meq | EXTENDED_RELEASE_TABLET | Freq: Once | ORAL | Status: AC
  Administered 2018-08-26: 40 meq via ORAL
  Filled 2018-08-26: qty 2

## 2018-08-26 MED ORDER — AMITRIPTYLINE HCL 25 MG PO TABS
75.0000 mg | ORAL_TABLET | Freq: Every day | ORAL | Status: DC
  Administered 2018-08-26 – 2018-08-31 (×6): 75 mg via ORAL
  Filled 2018-08-26 (×6): qty 3

## 2018-08-26 MED ORDER — FUROSEMIDE 10 MG/ML IJ SOLN
40.0000 mg | Freq: Once | INTRAMUSCULAR | Status: AC
  Administered 2018-08-26: 40 mg via INTRAVENOUS
  Filled 2018-08-26: qty 4

## 2018-08-26 MED ORDER — SODIUM CHLORIDE 0.9% FLUSH
3.0000 mL | Freq: Two times a day (BID) | INTRAVENOUS | Status: DC
  Administered 2018-08-26 – 2018-08-31 (×12): 3 mL via INTRAVENOUS

## 2018-08-26 MED ORDER — ACETAMINOPHEN 325 MG PO TABS
650.0000 mg | ORAL_TABLET | ORAL | Status: DC | PRN
  Administered 2018-08-26 – 2018-09-01 (×9): 650 mg via ORAL
  Filled 2018-08-26 (×9): qty 2

## 2018-08-26 MED ORDER — INFLUENZA VAC SPLIT QUAD 0.5 ML IM SUSY
0.5000 mL | PREFILLED_SYRINGE | INTRAMUSCULAR | Status: AC
  Administered 2018-08-28: 0.5 mL via INTRAMUSCULAR
  Filled 2018-08-26: qty 0.5

## 2018-08-26 MED ORDER — GABAPENTIN 300 MG PO CAPS
600.0000 mg | ORAL_CAPSULE | Freq: Two times a day (BID) | ORAL | Status: DC
  Administered 2018-08-26 – 2018-09-01 (×13): 600 mg via ORAL
  Filled 2018-08-26 (×13): qty 2

## 2018-08-26 MED ORDER — ATORVASTATIN CALCIUM 40 MG PO TABS
40.0000 mg | ORAL_TABLET | Freq: Every day | ORAL | Status: DC
  Administered 2018-08-27 – 2018-09-01 (×6): 40 mg via ORAL
  Filled 2018-08-26 (×6): qty 1

## 2018-08-26 MED ORDER — ASPIRIN 81 MG PO CHEW
324.0000 mg | CHEWABLE_TABLET | Freq: Once | ORAL | Status: AC
  Administered 2018-08-26: 324 mg via ORAL
  Filled 2018-08-26: qty 4

## 2018-08-26 MED ORDER — FOLIC ACID 1 MG PO TABS
1.0000 mg | ORAL_TABLET | Freq: Every day | ORAL | Status: DC
  Administered 2018-08-26 – 2018-09-01 (×7): 1 mg via ORAL
  Filled 2018-08-26 (×7): qty 1

## 2018-08-26 MED ORDER — AMLODIPINE BESYLATE 10 MG PO TABS
10.0000 mg | ORAL_TABLET | Freq: Every day | ORAL | Status: DC
  Administered 2018-08-27 – 2018-09-01 (×6): 10 mg via ORAL
  Filled 2018-08-26 (×6): qty 1

## 2018-08-26 MED ORDER — ONDANSETRON HCL 4 MG/2ML IJ SOLN: 4.0000 mg | Freq: Four times a day (QID) | INTRAMUSCULAR | Status: DC | PRN

## 2018-08-26 MED ORDER — APIXABAN 5 MG PO TABS
5.0000 mg | ORAL_TABLET | Freq: Two times a day (BID) | ORAL | Status: DC
  Administered 2018-08-26 – 2018-08-27 (×3): 5 mg via ORAL
  Filled 2018-08-26 (×4): qty 1

## 2018-08-26 MED ORDER — MORPHINE SULFATE (PF) 2 MG/ML IV SOLN
1.0000 mg | Freq: Once | INTRAVENOUS | Status: AC
  Administered 2018-08-26: 1 mg via INTRAVENOUS
  Filled 2018-08-26: qty 1

## 2018-08-26 MED ORDER — THIAMINE HCL 100 MG/ML IJ SOLN
100.0000 mg | Freq: Every day | INTRAMUSCULAR | Status: DC
  Administered 2018-08-26: 100 mg via INTRAVENOUS
  Filled 2018-08-26 (×2): qty 2

## 2018-08-26 NOTE — ED Notes (Signed)
MD text paged regarding SDU bed request
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
## 2018-08-26 NOTE — Care Management Note (Signed)
Case Management Note  Patient Details  Name: Brad [name] MRN: [id number] Date of Birth: 1961-01-12  Subjective/Objective:                  57 y.o. male presenting with shortness of breath and chest pain.  From home with friend.  Action/Plan: Admit status INPATIENT (SOB, CP); anticipate discharge [place].   Expected Discharge Date:  (unknown)               Expected Discharge Plan:  [place]  In-House Referral:  Clinical Social Work  Discharge planning Services  CM Consult  Post Acute Care Choice:    Choice offered to:     DME Arranged:    DME Agency:     HH Arranged:  PT [place]:  [place]  Status of Service:  In process, will continue to follow  If discussed at Long Length of Stay Meetings, dates discussed:    Additional Comments: Pt currently active with [facility] for PT services.  Resumption of care requested. Janae Sauce, RN of RN notified.  No DME needs identified at this time.  Fuller Mandril, RN 08/26/2018, 12:02 PM

## 2018-08-26 NOTE — Discharge Planning (Signed)
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

## 2018-08-26 NOTE — Telephone Encounter (Signed)
This CM spoke to patient who wanted this CM to know that he was at the hospital because his feet were" really swollen" and he was being admitted. He said that he was having trouble breathing in the middle of the night.  This CM reminded him that he did not have all of his medications since being home and he left the hospital before he was supposed to leave. He said that he understood and this time he would stay until discharge and make sure that he gets all of his prescriptions and discharge instructions.  He noted that he still has the cane he was given at discharge. He said that the home health nurse from [facility] was supposed to see him today.  Informed him that this CM would contact [place].   Call place to [facility], spoke to [facility] and informed her that the patient is in the hospital  She said that she would notify Beth, the nurse that was scheduled to see him today.

## 2018-08-26 NOTE — Telephone Encounter (Signed)
Mr.Gullatt called requesting to speak with you. He did not disclose the reason. Please follow up with patient. He is currently admitted at the ED at [facility]. He is in room 17. Would like to be called at the hospital room number.
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
## 2018-08-26 NOTE — ED Notes (Signed)
Lunch Tray ordered 
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
## 2018-08-26 NOTE — ED Notes (Signed)
Lunch tray provided. 

## 2018-08-26 NOTE — ED Triage Notes (Signed)
Per EMS, pt coming from home with complaints of sudden left sided chest pain with no radiation. Pt stated that he has been SOB all day and has had increase swelling in both legs for the past couple of days. Pt started on lasix last week for his CHF. Pt took 1 nitro without pain relief and 324mg  ASA via EMS.

## 2018-08-27 DIAGNOSIS — I482 Chronic atrial fibrillation, unspecified: Secondary | ICD-10-CM

## 2018-08-27 DIAGNOSIS — I5021 Acute systolic (congestive) heart failure: Secondary | ICD-10-CM

## 2018-08-27 DIAGNOSIS — E871 Hypo-osmolality and hyponatremia: Secondary | ICD-10-CM

## 2018-08-27 DIAGNOSIS — K7030 Alcoholic cirrhosis of liver without ascites: Secondary | ICD-10-CM

## 2018-08-27 DIAGNOSIS — I10 Essential (primary) hypertension: Secondary | ICD-10-CM

## 2018-08-27 DIAGNOSIS — F1410 Cocaine abuse, uncomplicated: Secondary | ICD-10-CM

## 2018-08-27 DIAGNOSIS — E118 Type 2 diabetes mellitus with unspecified complications: Secondary | ICD-10-CM

## 2018-08-27 LAB — BASIC METABOLIC PANEL
Anion gap: 12 (ref 5–15)
BUN: 22 mg/dL — ABNORMAL HIGH (ref 6–20)
CO2: 24 mmol/L (ref 22–32)
Calcium: 8.5 mg/dL — ABNORMAL LOW (ref 8.9–10.3)
Chloride: 93 mmol/L — ABNORMAL LOW (ref 98–111)
Creatinine, Ser: 1.67 mg/dL — ABNORMAL HIGH (ref 0.61–1.24)
GFR calc Af Amer: 51 mL/min — ABNORMAL LOW (ref >60)
GFR calc non Af Amer: 44 mL/min — ABNORMAL LOW (ref >60)
Glucose, Bld: 189 mg/dL — ABNORMAL HIGH (ref 70–99)
Potassium: 3.7 mmol/L (ref 3.5–5.1)
Sodium: 129 mmol/L — ABNORMAL LOW (ref 135–145)

## 2018-08-27 LAB — GLUCOSE, CAPILLARY
Glucose-Capillary: 174 mg/dL — ABNORMAL HIGH (ref 70–99)
Glucose-Capillary: 200 mg/dL — ABNORMAL HIGH (ref 70–99)
Glucose-Capillary: 266 mg/dL — ABNORMAL HIGH (ref 70–99)
Glucose-Capillary: 165 mg/dL — ABNORMAL HIGH (ref 70–99)

## 2018-08-27 LAB — CBC
HCT: 32.7 % — ABNORMAL LOW (ref 39.0–52.0)
Hemoglobin: 11 g/dL — ABNORMAL LOW (ref 13.0–17.0)
MCH: 31.6 pg (ref 26.0–34.0)
MCHC: 33.6 g/dL (ref 30.0–36.0)
MCV: 94 fL (ref 78.0–100.0)
Platelets: 249 10*3/uL (ref 150–400)
RBC: 3.48 MIL/uL — ABNORMAL LOW (ref 4.22–5.81)
RDW: 12.5 % (ref 11.5–15.5)
WBC: 6.2 10*3/uL (ref 4.0–10.5)

## 2018-08-27 LAB — TROPONIN I
Troponin I: 0.03 ng/mL (ref <0.03)
Troponin I: 0.04 ng/mL (ref <0.03)
Troponin I: 0.04 ng/mL (ref <0.03)

## 2018-08-27 LAB — HEMOGLOBIN A1C
Hgb A1c MFr Bld: 7.4 % — ABNORMAL HIGH (ref 4.8–5.6)
Mean Plasma Glucose: 165.68 mg/dL

## 2018-08-27 MED ORDER — SPIRONOLACTONE 25 MG PO TABS
25.0000 mg | ORAL_TABLET | Freq: Every day | ORAL | Status: DC
  Administered 2018-08-27 – 2018-09-01 (×6): 25 mg via ORAL
  Filled 2018-08-27 (×6): qty 1

## 2018-08-27 MED ORDER — FUROSEMIDE 10 MG/ML IJ SOLN
40.0000 mg | Freq: Two times a day (BID) | INTRAMUSCULAR | Status: DC
  Administered 2018-08-27 – 2018-08-30 (×7): 40 mg via INTRAVENOUS
  Filled 2018-08-27 (×8): qty 4

## 2018-08-27 MED ORDER — CLONIDINE HCL 0.1 MG PO TABS: 0.1000 mg | ORAL_TABLET | ORAL | Status: DC

## 2018-08-27 MED ORDER — CLONIDINE HCL 0.1 MG PO TABS: 0.1000 mg | ORAL_TABLET | Freq: Every day | ORAL | Status: DC

## 2018-08-27 MED ORDER — LOSARTAN POTASSIUM 25 MG PO TABS
25.0000 mg | ORAL_TABLET | Freq: Every day | ORAL | Status: DC
  Administered 2018-08-27 – 2018-09-01 (×6): 25 mg via ORAL
  Filled 2018-08-27 (×6): qty 1

## 2018-08-27 MED ORDER — FUROSEMIDE 10 MG/ML IJ SOLN: 40.0000 mg | Freq: Two times a day (BID) | INTRAMUSCULAR | Status: DC

## 2018-08-27 MED ORDER — CLONIDINE HCL 0.1 MG PO TABS
0.1000 mg | ORAL_TABLET | Freq: Every day | ORAL | Status: DC
  Administered 2018-08-28: 0.1 mg via ORAL
  Filled 2018-08-27: qty 1
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
## 2018-08-27 NOTE — Progress Notes (Signed)
Progress Note  Patient Name: Brad [name] Date of Encounter: 08/27/2018  Primary Cardiologist:   No primary care provider on file.   Subjective   Somnolent.  Thinks that his breathing is 60% better.  No pain.   Inpatient Medications    Scheduled Meds: . amitriptyline  75 mg Oral QHS  . amLODipine  10 mg Oral Daily  . apixaban  5 mg Oral BID  . atorvastatin  40 mg Oral Daily  . cloNIDine  0.1 mg Oral BID  . folic acid  1 mg Oral Daily  . gabapentin  600 mg Oral BID  . Influenza vac split quadrivalent PF  0.5 mL Intramuscular Tomorrow-1000  . insulin aspart  0-9 Units Subcutaneous TID WC  . multivitamin with minerals  1 tablet Oral Daily  . sodium chloride flush  3 mL Intravenous Q12H  . thiamine  100 mg Oral Daily   Or  . thiamine  100 mg Intravenous Daily   Continuous Infusions: . sodium chloride     PRN Meds: sodium chloride, acetaminophen, LORazepam **OR** LORazepam, ondansetron (ZOFRAN) IV, sodium chloride flush   Vital Signs    Vitals:   08/26/18 2135 08/27/18 0000 08/27/18 0400 08/27/18 0804  BP: (!) 145/95 133/89 105/65 128/87  Pulse:  85 76 79  Resp:  (!) 22  20  Temp:  97.6 F (36.4 C) 97.6 F (36.4 C) 98.5 F (36.9 C)  TempSrc:  Oral Oral Oral  SpO2:  95% 96% 100%  Weight:   87 kg   Height:        Intake/Output Summary (Last 24 hours) at 08/27/2018 1057 Last data filed at 08/27/2018 0900 Gross per 24 hour  Intake 840 ml  Output -  Net 840 ml   Filed Weights   08/26/18 1046 08/26/18 1708 08/27/18 0400  Weight: 86.1 kg 86.1 kg 87 kg    Telemetry    Atrial with controlled ventricular response.  - Personally Reviewed  ECG    NA - Personally Reviewed  Physical Exam   GEN: No acute distress.   Neck:    Positive to angle of the jaw at 45 degrees.  Cardiac: RRR, no murmurs, rubs, or gallops.  Respiratory: Clear  to auscultation bilaterally. GI: Soft, nontender, non-distended  MS:    Moderate edema; No deformity. Neuro:  Nonfocal    Psych: Normal affect   Labs    Chemistry Recent Labs  Lab 08/26/18 0404 08/27/18 0631  NA 127* 129*  K 3.4* 3.7  CL 93* 93*  CO2 23 24  GLUCOSE 155* 189*  BUN 14 22*  CREATININE 1.50* 1.67*  CALCIUM 8.5* 8.5*  PROT 7.5  --   ALBUMIN 2.8*  --   AST 23  --   ALT 16  --   ALKPHOS 53  --   BILITOT 1.0  --   GFRNONAA 50* 44*  GFRAA 58* 51*  ANIONGAP 11 12     Hematology Recent Labs  Lab 08/26/18 0404 08/27/18 0631  WBC 6.2 6.2  RBC 3.24* 3.48*  HGB 10.3* 11.0*  HCT 30.2* 32.7*  MCV 93.2 94.0  MCH 31.8 31.6  MCHC 34.1 33.6  RDW 12.1 12.5  PLT 265 249    Cardiac Enzymes Recent Labs  Lab 08/26/18 0750 08/26/18 1046 08/26/18 1647 08/26/18 2216  TROPONINI 0.03* 0.04* 0.03* 0.03*   No results for input(s): TROPIPOC in the last 168 hours.   BNP Recent Labs  Lab 08/20/18 1410 08/26/18 0404  BNP 757.1*  953.7*     DDimer No results for input(s): DDIMER in the last 168 hours.   Radiology    Dg Chest Portable 1 View  Result Date: 08/26/2018 CLINICAL DATA:  Chest pain, shortness of breath, nausea and vomiting, and bilateral leg edema for 3 days. EXAM: PORTABLE CHEST 1 VIEW COMPARISON:  08/20/2018 FINDINGS: Cardiac enlargement. No vascular congestion or edema. Persistent patchy opacity in the right upper lung has a somewhat branching configuration, possibly indicating bronchopneumonia or mucous plugging. No blunting of costophrenic angles. No pneumothorax. Mediastinal contours appear intact. Calcification of the aorta. IMPRESSION: Cardiac enlargement. No vascular congestion or edema. Persistent patchy opacity in the right upper lung may represent bronchopneumonia or mucous plugging. Electronically Signed   By: Lucienne Capers M.D.   On: 08/26/2018 04:17    Cardiac Studies   NA  Patient Profile     57 y.o. male with a hx of HTN,non-ischemic cardiomyopathy (EF 20-25%),atrial fibrillation on Eliquis, DM 2, hyperlipidemia, history of IV drug abuse, ongoing  cocaine and use,CKD stage III,hepatitis Ccirrhosis and non-complaince who is being seen for the evaluation of acute CHF exacerbation at the request of Dr. Tammi Klippel.  Assessment & Plan    ACUTE ON CHRONIC SYSTOLIC HF:  I/O incomplete.  He says that he has filled up three "jugs."    I will order 40 mg IV bid Lasix.   ATRIAL FIB/FLUTTER:  He reports compliance with his Eliquis but I am not sure.  Continue with this and rate control.    CKD III:   Creat is stable.    HYPONATREMIA:  Na is chronically low and stable.    For questions or updates, please contact [place] Please consult [email or web address] for contact info under Cardiology/STEMI.   Signed, Minus Breeding, MD  08/27/2018, 10:57 AM

## 2018-08-27 NOTE — Progress Notes (Signed)
FPTS Interim Progress Note  Called by RN for patient complaining of new onset chest pain.  Obtain EKG and troponins to trend.  Went to see patient he was sleeping comfortably in the bed when I arrived.  Nurse had administered Ativan by this time.  Patient is significantly tender on left side of chest when barely touching him.  Patient requesting pain medicine for this.  However because he is sleeping comfortably will treat with K pad and continue to monitor and trend troponins.  Likely not ACS event given tenderness to palpation.  Besan Ketchem, [place], DO 08/27/2018, 2:57 PM PGY-2, [place] Medicine Service pager [phone]

## 2018-08-28 ENCOUNTER — Inpatient Hospital Stay (HOSPITAL_COMMUNITY): Payer: Medicaid Other

## 2018-08-28 LAB — BASIC METABOLIC PANEL
Anion gap: 11 (ref 5–15)
BUN: 20 mg/dL (ref 6–20)
CO2: 27 mmol/L (ref 22–32)
Calcium: 8.9 mg/dL (ref 8.9–10.3)
Chloride: 94 mmol/L — ABNORMAL LOW (ref 98–111)
Creatinine, Ser: 1.62 mg/dL — ABNORMAL HIGH (ref 0.61–1.24)
GFR calc Af Amer: 52 mL/min — ABNORMAL LOW (ref >60)
GFR calc non Af Amer: 45 mL/min — ABNORMAL LOW (ref >60)
Glucose, Bld: 144 mg/dL — ABNORMAL HIGH (ref 70–99)
Potassium: 3.5 mmol/L (ref 3.5–5.1)
Sodium: 132 mmol/L — ABNORMAL LOW (ref 135–145)

## 2018-08-28 LAB — GLUCOSE, CAPILLARY
Glucose-Capillary: 168 mg/dL — ABNORMAL HIGH (ref 70–99)
Glucose-Capillary: 235 mg/dL — ABNORMAL HIGH (ref 70–99)
Glucose-Capillary: 149 mg/dL — ABNORMAL HIGH (ref 70–99)
Glucose-Capillary: 163 mg/dL — ABNORMAL HIGH (ref 70–99)

## 2018-08-28 LAB — TROPONIN I: Troponin I: 0.04 ng/mL (ref <0.03)

## 2018-08-28 MED ORDER — ALUM & MAG HYDROXIDE-SIMETH 200-200-20 MG/5ML PO SUSP: 15.0000 mL | Freq: Four times a day (QID) | ORAL | Status: DC | PRN

## 2018-08-28 MED ORDER — INSULIN GLARGINE 100 UNIT/ML ~~LOC~~ SOLN
3.0000 [IU] | Freq: Every day | SUBCUTANEOUS | Status: DC
  Administered 2018-08-28 – 2018-09-01 (×5): 3 [IU] via SUBCUTANEOUS
  Filled 2018-08-28 (×5): qty 0.03

## 2018-08-28 MED ORDER — CLONIDINE HCL 0.1 MG PO TABS
0.1000 mg | ORAL_TABLET | Freq: Every day | ORAL | Status: DC
  Administered 2018-08-29: 0.1 mg via ORAL
  Filled 2018-08-28: qty 1

## 2018-08-28 MED ORDER — DICLOFENAC SODIUM 1 % TD GEL
2.0000 g | Freq: Four times a day (QID) | TRANSDERMAL | Status: DC | PRN
  Administered 2018-08-28 – 2018-08-29 (×2): 2 g via TOPICAL
  Filled 2018-08-28: qty 100

## 2018-08-28 NOTE — Progress Notes (Signed)
Progress Note  Patient Name: Brad [name] Date of Encounter: 08/28/2018  Primary Cardiologist:   No primary care provider on file.   Subjective   Chest pain last night but tender to palpation per the nursing note.    Inpatient Medications    Scheduled Meds: . amitriptyline  75 mg Oral QHS  . amLODipine  10 mg Oral Daily  . atorvastatin  40 mg Oral Daily  . cloNIDine  0.1 mg Oral Daily   Followed by  . [START ON 08/31/2018] cloNIDine  0.1 mg Oral QODAY  . folic acid  1 mg Oral Daily  . furosemide  40 mg Intravenous BID  . gabapentin  600 mg Oral BID  . insulin aspart  0-9 Units Subcutaneous TID WC  . losartan  25 mg Oral Daily  . multivitamin with minerals  1 tablet Oral Daily  . sodium chloride flush  3 mL Intravenous Q12H  . spironolactone  25 mg Oral Daily  . thiamine  100 mg Oral Daily   Or  . thiamine  100 mg Intravenous Daily   Continuous Infusions: . sodium chloride     PRN Meds: sodium chloride, acetaminophen, LORazepam **OR** LORazepam, ondansetron (ZOFRAN) IV, sodium chloride flush   Vital Signs    Vitals:   08/27/18 1930 08/27/18 2331 08/28/18 0400 08/28/18 0745  BP: 115/76 135/90 135/87 128/87  Pulse: 76 79 81 (!) 115  Resp:  17 20 20   Temp: 98.2 F (36.8 C) (!) 97.5 F (36.4 C) 98.2 F (36.8 C) 98.6 F (37 C)  TempSrc: Oral Oral Oral Oral  SpO2: 96% 99% 98% 97%  Weight:   88.7 kg   Height:        Intake/Output Summary (Last 24 hours) at 08/28/2018 0925 Last data filed at 08/28/2018 0900 Gross per 24 hour  Intake 1080 ml  Output -  Net 1080 ml   Filed Weights   08/26/18 1708 08/27/18 0400 08/28/18 0400  Weight: 86.1 kg 87 kg 88.7 kg    Telemetry    Atrial fib with controlled rate  - Personally Reviewed  ECG    NA - Personally Reviewed  Physical Exam   GEN: No  acute distress.   Neck:   Positive  JVD Cardiac: Irregular RR, no murmurs, rubs, or gallops.  Respiratory:     Clear  to auscultation bilaterally. GI: Soft,  nontender, non-distended, normal bowel sounds  MS:  Mild edema; No deformity. Neuro:   Nonfocal  Psych: Oriented and appropriate    Labs    Chemistry Recent Labs  Lab 08/26/18 0404 08/27/18 0631 08/28/18 0308  NA 127* 129* 132*  K 3.4* 3.7 3.5  CL 93* 93* 94*  CO2 23 24 27   GLUCOSE 155* 189* 144*  BUN 14 22* 20  CREATININE 1.50* 1.67* 1.62*  CALCIUM 8.5* 8.5* 8.9  PROT 7.5  --   --   ALBUMIN 2.8*  --   --   AST 23  --   --   ALT 16  --   --   ALKPHOS 53  --   --   BILITOT 1.0  --   --   GFRNONAA 50* 44* 45*  GFRAA 58* 51* 52*  ANIONGAP 11 12 11      Hematology Recent Labs  Lab 08/26/18 0404 08/27/18 0631  WBC 6.2 6.2  RBC 3.24* 3.48*  HGB 10.3* 11.0*  HCT 30.2* 32.7*  MCV 93.2 94.0  MCH 31.8 31.6  MCHC 34.1 33.6  RDW  12.1 12.5  PLT 265 249    Cardiac Enzymes Recent Labs  Lab 08/26/18 2216 08/27/18 1459 08/27/18 2056 08/28/18 0308  TROPONINI 0.03* 0.04* 0.04* 0.04*   No results for input(s): TROPIPOC in the last 168 hours.   BNP Recent Labs  Lab 08/26/18 0404  BNP 953.7*     DDimer No results for input(s): DDIMER in the last 168 hours.   Radiology    No results found.  Cardiac Studies   NA  Patient Profile     57 y.o. male with a hx of HTN,non-ischemic cardiomyopathy (EF 20-25%),atrial fibrillation on Eliquis, DM 2, hyperlipidemia, history of IV drug abuse, ongoing cocaine and use,CKD stage III,hepatitis Ccirrhosis and non-complaince who is being seen for the evaluation of acute CHF exacerbation at the request of Dr. Tammi Klippel.  Assessment & Plan    ACUTE ON CHRONIC SYSTOLIC HF:    I/O incomplete.   I asked the patient to make sure that he saves his urine.  I am not sure that he is doing that.  I had a frank discussion with him about med compliance and his high risk of mortality with his non compliance with home meds.    ATRIAL FIB/FLUTTER:   Continue current therapy.  Rate is controlled.   CKD III:   Creat is still stable.     HYPONATREMIA:  Na is improved.   For questions or updates, please contact [place] Please consult [email or web address] for contact info under Cardiology/STEMI.   Signed, Minus Breeding, MD  08/28/2018, 9:25 AM

## 2018-08-28 NOTE — Progress Notes (Signed)
Family Medicine Teaching Service Daily Progress Note Intern Pager: [phone]  Patient name: Brad [name] Medical record number: [id number] Date of birth: [birth year] Age: 57 y.o. 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   Assessment and Plan:  CHF exacerbation  - 1+ bilateral lower extremity pitting edema on exam and abdominal distention, no new oxygen requirement and pulm exam is clear and not consistent with volume overload, still with JVD on exam  - weight today 195 up from 189 on admission, dry weight 177  - 6 unmeasured urine occurrences over the past 24 hours  - crt 1.62, stable from 1.67 yesterday, baseline 1.4  - potassium 3.5, will obtain magnesium tomorrow  - Echo with EF 95-28%, grade 3 diastolic dysfunction, diffuse hypokinesis, multi chamber dilation, mitral aortic and tricuspid regurgitation and elevated pulmonary artery pressure 50 mmHg and non compressible IVC  This is overall worsened from echo results this time last year ( EF 35-40%, G2DD) - cardiac cath two years ago with mild non obstructive CAD  - will continue diuresis with IV lasix 40 mg BID, very difficult to gauge progress with increasing weight and unmeasured urine  - continue atorvastatin - losartan and spironolactone started this admission  - heart healthy diet with fluid restriction   Abdominal distention, history of cirrhosis  - obtain abdominal ultrasound to evaluate for ascites   Chest wall pain  -still with chest wall discomfort exacerbated by palpation of the intercostal muscles and relieved with placing a pillow under his side. No  relief with low dose tylenol. Voltaren ordered but has not been tried yet  - would try voltaren  - order placed for kpad   Hypertension  - blood pressure well controlled  - tapering clonidine, tolerating once 0.1 mg daily today, may try discontinuing tomorrow  - on amlodipine from home, not ideal given heart failure  - not a candidate for ACE given history of throat swelling in the past, losartan started this admission  - spironolactone started this admission, room to increase this if he becomes hypertensive   Atrial fibrillation/flutter  - rate is controlled  -chads vasc score is 3, high risk of bleeding given multiple falls, bleeding risk felt higher than stroke ppx   Chronic hyponatremia  - Na continues to improve with diuresis and fluid restriction , 132 today   DM with neuropathy  Glucose 144- 266 overnight  - continue sensitive sliding scale  - start lantus 3 units daily  - continue to monitor CBGs  - continue home gabapentin   ETOH use  Cocaine use  Tobacco use  - CIWA consistently zero since the time of admission, discontinue ativan  - continue thiamine, folate, MVM  - continue to counsel   FEN/GI: heart healthy, fluid restriction  PPx: elliquis   Disposition: discharge pending improvement in volume status. PT recs home health PT.   Subjective:  Expresses some concern over the left chest wall pain which he felt was not addressed. Denies new substernal chest pain or difficulty breathing. Was able to lay down flat to sleep last night. After call about the chest wall pain an order for voltaren was placed, 3 troponins were  obtained and they were stable at 0.04 which is consistent with his baseline EKG was not consistent with acute ischemia.    Objective: Temp:  [97.3 F (36.3 C)-98.6 F (37 C)] 97.3 F (36.3 C) (09/07 1139) Pulse Rate:  [74-115] 74 (09/07 1139) Resp:  [17-20] 20 (09/07 1139) BP: (109-135)/(61-90) 109/61 (09/07 1139) SpO2:  [96 %-99 %] 99 % (09/07  1139) Weight:  [88.7 kg] 88.7 kg (09/07 0400) Physical Exam: General: comfortable appearing, no acute distress  Cardiovascular: irregular rate and rhythm, no murmurs, 1+ bilateral peripheral edema, compression stockings in place, exquisite left chest wall tenderness to palpation  Respiratory: normal work of breathing, not on supplemental oxygen, lungs are clear to auscultation  Abdomen: distended, soft, diffusely mildly uncomfortable with palpation  Extremities: no rashes, warm, bandaid over the posterior head is clean and dry   Laboratory: Recent Labs  Lab 08/26/18 0404 08/27/18 0631  WBC 6.2 6.2  HGB 10.3* 11.0*  HCT 30.2* 32.7*  PLT 265 249   Recent Labs  Lab 08/26/18 0404 08/27/18 0631 08/28/18 0308  NA 127* 129* 132*  K 3.4* 3.7 3.5  CL 93* 93* 94*  CO2 23 24 27   BUN 14 22* 20  CREATININE 1.50* 1.67* 1.62*  CALCIUM 8.5* 8.5* 8.9  PROT 7.5  --   --   BILITOT 1.0  --   --   ALKPHOS 53  --   --   ALT 16  --   --   AST 23  --   --   GLUCOSE 155* 189* 144*   Imaging/Diagnostic Tests:  Echo 07/27/2018  - Left ventricle: The cavity size was normal. Systolic function was   severely reduced. The estimated ejection fraction was in the   range of 20% to 25%. Diffuse hypokinesis. Doppler parameters are   consistent with a reversible restrictive pattern, indicative of   decreased left ventricular diastolic compliance and/or increased   left atrial pressure (grade 3 diastolic dysfunction). - Aortic valve: There was trivial regurgitation. Valve area (VTI):   2.4 cm^2. Valve area (Vmean): 2.81 cm^2. - Mitral valve: There was moderate regurgitation. - Left atrium: The atrium was severely dilated. - Right ventricle: The cavity size was mildly dilated. Wall   thickness was normal. - Right atrium: The atrium was moderately dilated. - Tricuspid valve: There was mild regurgitation. - Pulmonary arteries: Systolic pressure was moderately increased.   PA peak pressure: 50 mm Hg  (S).  Ledell Noss, MD 08/28/2018, 2:28 PM PGY-3, [facility] [place] Intern pager: [phone], text pages welcome

## 2018-08-29 LAB — BASIC METABOLIC PANEL
Anion gap: 11 (ref 5–15)
BUN: 21 mg/dL — ABNORMAL HIGH (ref 6–20)
CO2: 26 mmol/L (ref 22–32)
Calcium: 8.9 mg/dL (ref 8.9–10.3)
Chloride: 97 mmol/L — ABNORMAL LOW (ref 98–111)
Creatinine, Ser: 1.63 mg/dL — ABNORMAL HIGH (ref 0.61–1.24)
GFR calc Af Amer: 52 mL/min — ABNORMAL LOW (ref >60)
GFR calc non Af Amer: 45 mL/min — ABNORMAL LOW (ref >60)
Glucose, Bld: 161 mg/dL — ABNORMAL HIGH (ref 70–99)
Potassium: 3.7 mmol/L (ref 3.5–5.1)
Sodium: 134 mmol/L — ABNORMAL LOW (ref 135–145)

## 2018-08-29 LAB — GLUCOSE, CAPILLARY
Glucose-Capillary: 210 mg/dL — ABNORMAL HIGH (ref 70–99)
Glucose-Capillary: 135 mg/dL — ABNORMAL HIGH (ref 70–99)
Glucose-Capillary: 181 mg/dL — ABNORMAL HIGH (ref 70–99)
Glucose-Capillary: 216 mg/dL — ABNORMAL HIGH (ref 70–99)

## 2018-08-29 LAB — MAGNESIUM: Magnesium: 1.5 mg/dL — ABNORMAL LOW (ref 1.7–2.4)

## 2018-08-29 MED ORDER — MAGNESIUM SULFATE 2 GM/50ML IV SOLN
2.0000 g | Freq: Once | INTRAVENOUS | Status: AC
  Administered 2018-08-29: 2 g via INTRAVENOUS
  Filled 2018-08-29: qty 50

## 2018-08-29 NOTE — Progress Notes (Signed)
Progress Note  Patient Name: Brad [name] Date of Encounter: 08/29/2018  Primary Cardiologist:   [name], MD   Subjective   No chest pain or SOB.  He says that he is having some pain in his abdomen left side.  No acute complaints or distress however.    Inpatient Medications    Scheduled Meds: . amitriptyline  75 mg Oral QHS  . amLODipine  10 mg Oral Daily  . atorvastatin  40 mg Oral Daily  . cloNIDine  0.1 mg Oral Daily  . folic acid  1 mg Oral Daily  . furosemide  40 mg Intravenous BID  . gabapentin  600 mg Oral BID  . insulin aspart  0-9 Units Subcutaneous TID WC  . insulin glargine  3 Units Subcutaneous Daily  . losartan  25 mg Oral Daily  . multivitamin with minerals  1 tablet Oral Daily  . sodium chloride flush  3 mL Intravenous Q12H  . spironolactone  25 mg Oral Daily  . thiamine  100 mg Oral Daily   Or  . thiamine  100 mg Intravenous Daily   Continuous Infusions: . sodium chloride     PRN Meds: sodium chloride, acetaminophen, alum & mag hydroxide-simeth, diclofenac sodium, ondansetron (ZOFRAN) IV, sodium chloride flush   Vital Signs    Vitals:   08/28/18 1922 08/28/18 2014 08/29/18 0335 08/29/18 0740  BP: 116/76 (!) 145/90 123/82 134/90  Pulse: 79  (!) 111 91  Resp:      Temp: 98.3 F (36.8 C)  98 F (36.7 C) 97.8 F (36.6 C)  TempSrc: Oral  Oral Oral  SpO2: 99%  93% 94%  Weight:   88.8 kg   Height:        Intake/Output Summary (Last 24 hours) at 08/29/2018 1132 Last data filed at 08/29/2018 0900 Gross per 24 hour  Intake 1080 ml  Output 2350 ml  Net -1270 ml   Filed Weights   08/27/18 0400 08/28/18 0400 08/29/18 0335  Weight: 87 kg 88.7 kg 88.8 kg    Telemetry    Atrial flutter with controlled rate  - Personally Reviewed  ECG    NA - Personally Reviewed  Physical Exam   GEN: No  acute distress.   Neck: No  JVD Cardiac: Irregular RR, no murmurs, rubs, or gallops.  Respiratory: Clear   to auscultation bilaterally. GI:  Soft, nontender, non-distended, normal bowel sounds  MS:  Trace edema; No deformity. Neuro:   Nonfocal  Psych: Oriented and appropriate     Labs    Chemistry Recent Labs  Lab 08/26/18 0404 08/27/18 0631 08/28/18 0308 08/29/18 0717  NA 127* 129* 132* 134*  K 3.4* 3.7 3.5 3.7  CL 93* 93* 94* 97*  CO2 23 24 27 26   GLUCOSE 155* 189* 144* 161*  BUN 14 22* 20 21*  CREATININE 1.50* 1.67* 1.62* 1.63*  CALCIUM 8.5* 8.5* 8.9 8.9  PROT 7.5  --   --   --   ALBUMIN 2.8*  --   --   --   AST 23  --   --   --   ALT 16  --   --   --   ALKPHOS 53  --   --   --   BILITOT 1.0  --   --   --   GFRNONAA 50* 44* 45* 45*  GFRAA 58* 51* 52* 52*  ANIONGAP 11 12 11 11      Hematology Recent Labs  Lab  08/26/18 0404 08/27/18 0631  WBC 6.2 6.2  RBC 3.24* 3.48*  HGB 10.3* 11.0*  HCT 30.2* 32.7*  MCV 93.2 94.0  MCH 31.8 31.6  MCHC 34.1 33.6  RDW 12.1 12.5  PLT 265 249    Cardiac Enzymes Recent Labs  Lab 08/26/18 2216 08/27/18 1459 08/27/18 2056 08/28/18 0308  TROPONINI 0.03* 0.04* 0.04* 0.04*   No results for input(s): TROPIPOC in the last 168 hours.   BNP Recent Labs  Lab 08/26/18 0404  BNP 953.7*     DDimer No results for input(s): DDIMER in the last 168 hours.   Radiology    US Abdomen Complete  Result Date: 08/29/2018 CLINICAL DATA:  Abdominal distention EXAM: ABDOMEN ULTRASOUND COMPLETE COMPARISON:  CT abdomen and pelvis 08/16/2018 FINDINGS: Gallbladder: Diffuse gallbladder wall thickening and edema with gallbladder wall thickness measured 11 mm. Tiny echogenic foci are poorly visualized but appear non mobile. These could be polyps versus nonmobile stones. Largest measures about 4 mm in diameter. Common bile duct: Diameter: 2.1 mm, normal Liver: Nodular liver contour compatible with cirrhosis. No focal liver lesions. Portal vein is patent on color Doppler imaging with normal direction of blood flow towards the liver. IVC: No abnormality visualized. Pancreas: Visualized  portion unremarkable. Spleen: Size and appearance within normal limits. Right Kidney: Length: 10.6 cm. Echogenicity within normal limits. No mass or hydronephrosis visualized. Left Kidney: Length: 9.5 cm. Echogenicity within normal limits. No mass or hydronephrosis visualized. Abdominal aorta: No aneurysm visualized. Other findings: A small right pleural effusion is identified. IMPRESSION: 1. Diffuse gallbladder wall thickening and edema. Nonspecific finding. This could be inflammatory or related to cirrhosis. Tiny nonmobile stones versus polyps measuring up to 4 mm, likely benign. 2. No bile duct dilatation. 3. Nodular liver contour consistent with cirrhosis. Normal portal venous flow direction. Spleen is not enlarged. 4. Small right pleural effusion. 5. No significant ascites demonstrated. Electronically Signed   By: Lucienne Capers M.D.   On: 08/29/2018 00:14    Cardiac Studies   NA  Patient Profile     57 y.o. male with a hx of HTN,non-ischemic cardiomyopathy (EF 20-25%),atrial fibrillation on Eliquis, DM 2, hyperlipidemia, history of IV drug abuse, ongoing cocaine and use,CKD stage III,hepatitis Ccirrhosis and non-complaince who is being seen for the evaluation of acute CHF exacerbation at the request of Dr. Tammi Klippel.  Assessment & Plan    ACUTE ON CHRONIC SYSTOLIC HF:    I/O net negative 1.15 liters yesterday.   If he tolerates ARB it would certainly be better to slowly titrate this over time weaning the Cozaar and clonidine. However, this would be a long term process and will require that he follow with out patient appts.  I discussed this with him yesterday.  Compliance will be the key to his long term prognosis.    ATRIAL FIB/FLUTTER:   No change in therapy.  Rate is controlled.    CKD III:   Creat is tolerating diuresis.  Continue current therapy.   HYPONATREMIA:  Na continues to improve   For questions or updates, please contact [place] Please consult [email or web address] for  contact info under Cardiology/STEMI.   Signed, Minus Breeding, MD  08/29/2018, 11:32 AM

## 2018-08-29 NOTE — Progress Notes (Signed)
Family Medicine Teaching Service Daily Progress Note Intern Pager: [phone]  Patient name: Brad [name] Medical record number: [id number] Date of birth: 03-Jul-1961 Age: 57 y.o. 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   Assessment and Plan:  CHF exacerbation  - began having lightheadedness with standing today, peripheral edema is trace today, still with JVD on exam  - weight stable at 195 up from 189 on admission, dry weight 177  - net negative 1 Liter yesterday  - crt stable1.6 , baseline 1.4  - potassium normal, will replace low mag  - will continue diuresis with IV lasix 40 mg BID - continue atorvastatin - losartan and spironolactone started this admission  - heart healthy diet with fluid restriction   Abdominal distention, history of cirrhosis  - abdominal ultrasound was without significant ascites   Chest wall pain  -resolved  - continue voltaren and kpad   Hypertension  - blood pressure well controlled but describing symptoms of lightheadedness with standing so we will attempt to continue to taper antihypertensives to allow for diuresis   - discontinue clonidine  - on amlodipine from home - not a candidate for ACE given history of throat swelling in the past, losartan started this admission  - spironolactone started this admission, room to increase this if he becomes hypertensive   Atrial fibrillation/flutter  - rate is controlled  -chads vasc score is 3, high risk of bleeding given multiple falls, bleeding risk felt higher than stroke ppx   Chronic hyponatremia  - Na continues to improve with  diuresis and fluid restriction  DM with neuropathy  Glucose 130 -160 overnight  - continue sensitive sliding scale and lantus 3 units daily  - continue to monitor CBGs  - continue home gabapentin   ETOH use  Cocaine use  Tobacco use  - CIWA consistently zero since the time of admission - continue thiamine, folate, MVM  - continue to counsel   FEN/GI: heart healthy, fluid restriction  PPx: elliquis   Disposition: discharge pending improvement in volume status. PT recs home health PT.   Subjective:  Mr. Wease is sleeping comfortably when I enter the room. The chest wall pain has become intermittent, he plans to try the k pad later but hes really not having pain at this time. He says that he has 2.5 urinals worth of urine output yesterday. He does note that when he rose from lying down to standing this morning he noticed some dizziness which was new.   Objective: Temp:  [97.5 F (36.4 C)-98.7 F (37.1 C)] 98.1 F (36.7 C) (09/08 1204) Pulse Rate:  [63-111] 63 (09/08 1204) Resp:  [20] 20 (09/07 1651) BP: (116-145)/(73-90) 124/79 (09/08 1204) SpO2:  [93 %-100 %] 95 % (09/08 1204) Weight:  [88.8 kg] 88.8 kg (09/08 0335) Physical Exam: General: comfortable appearing, no acute distress  Cardiovascular: irregular rate and rhythm, no murmurs, trace bilateral peripheral edema, no compression stockings in place Respiratory: normal work of breathing, not on supplemental oxygen, lungs are clear to auscultation  Abdomen: distended, soft, diffuse mild discomfort to palpation  Extremities: no rashes, warm, bandaid over the posterior head has some oozing blood  Laboratory: Recent Labs  Lab 08/26/18 0404 08/27/18 0631  WBC 6.2 6.2  HGB 10.3* 11.0*  HCT 30.2* 32.7*  PLT 265 249   Recent Labs  Lab 08/26/18 0404 08/27/18 0631 08/28/18 0308 08/29/18 0717  NA 127* 129* 132* 134*  K 3.4* 3.7 3.5 3.7  CL 93* 93* 94* 97*  CO2 23 24 27 26   BUN 14 22* 20 21*  CREATININE 1.50* 1.67*  1.62* 1.63*  CALCIUM 8.5* 8.5* 8.9 8.9  PROT 7.5  --   --   --   BILITOT 1.0  --   --   --   ALKPHOS 53  --   --   --   ALT 16  --   --   --   AST 23  --   --   --   GLUCOSE 155* 189* 144* 161*   Imaging/Diagnostic Tests:  Abdominal ultrasound  1. Diffuse gallbladder wall thickening and edema. Nonspecific finding. This could be inflammatory or related to cirrhosis. Tiny nonmobile stones versus polyps measuring up to 4 mm, likely benign. 2. No bile duct dilatation. 3. Nodular liver contour consistent with cirrhosis. Normal portal venous flow direction. Spleen is not enlarged. 4. Small right pleural effusion. 5. No significant ascites demonstrated.  Echo 07/27/2018  - Left ventricle: The cavity size was normal. Systolic function was   severely reduced. The estimated ejection fraction was in the   range of 20% to 25%. Diffuse hypokinesis. Doppler parameters are   consistent with a reversible restrictive pattern, indicative of   decreased left ventricular diastolic compliance and/or increased   left atrial pressure (grade 3 diastolic dysfunction). - Aortic valve: There was trivial regurgitation. Valve area (VTI):   2.4 cm^2. Valve area (Vmean): 2.81 cm^2. - Mitral valve: There was moderate regurgitation. - Left atrium: The atrium was severely dilated. - Right ventricle: The cavity size was mildly dilated. Wall   thickness was normal. - Right atrium: The atrium was moderately dilated. - Tricuspid valve: There was mild regurgitation. - Pulmonary arteries: Systolic pressure was moderately increased.   PA peak pressure: 50 mm Hg (S).  Ledell Noss, MD 08/29/2018, 12:52 PM PGY-3, [facility] [place] Intern pager: [phone], text pages welcome

## 2018-08-30 ENCOUNTER — Telehealth: Payer: Self-pay

## 2018-08-30 ENCOUNTER — Telehealth: Payer: Self-pay | Admitting: Family Medicine

## 2018-08-30 DIAGNOSIS — R0602 Shortness of breath: Secondary | ICD-10-CM

## 2018-08-30 LAB — GLUCOSE, CAPILLARY
Glucose-Capillary: 163 mg/dL — ABNORMAL HIGH (ref 70–99)
Glucose-Capillary: 164 mg/dL — ABNORMAL HIGH (ref 70–99)
Glucose-Capillary: 136 mg/dL — ABNORMAL HIGH (ref 70–99)
Glucose-Capillary: 218 mg/dL — ABNORMAL HIGH (ref 70–99)

## 2018-08-30 LAB — BASIC METABOLIC PANEL
Anion gap: 11 (ref 5–15)
BUN: 26 mg/dL — ABNORMAL HIGH (ref 6–20)
CO2: 25 mmol/L (ref 22–32)
Calcium: 8.8 mg/dL — ABNORMAL LOW (ref 8.9–10.3)
Chloride: 99 mmol/L (ref 98–111)
Creatinine, Ser: 1.75 mg/dL — ABNORMAL HIGH (ref 0.61–1.24)
GFR calc Af Amer: 48 mL/min — ABNORMAL LOW (ref >60)
GFR calc non Af Amer: 41 mL/min — ABNORMAL LOW (ref >60)
Glucose, Bld: 176 mg/dL — ABNORMAL HIGH (ref 70–99)
Potassium: 3.8 mmol/L (ref 3.5–5.1)
Sodium: 135 mmol/L (ref 135–145)

## 2018-08-30 LAB — MAGNESIUM: Magnesium: 1.9 mg/dL (ref 1.7–2.4)

## 2018-08-30 MED ORDER — FUROSEMIDE 40 MG PO TABS: 40.0000 mg | ORAL_TABLET | Freq: Two times a day (BID) | ORAL | Status: DC

## 2018-08-30 MED ORDER — LIDOCAINE 5 % EX PTCH
1.0000 | MEDICATED_PATCH | CUTANEOUS | Status: AC
  Administered 2018-08-30: 1 via TRANSDERMAL
  Filled 2018-08-30: qty 1

## 2018-08-30 MED ORDER — FUROSEMIDE 40 MG PO TABS
40.0000 mg | ORAL_TABLET | Freq: Two times a day (BID) | ORAL | Status: DC
  Administered 2018-08-31 – 2018-09-01 (×3): 40 mg via ORAL
  Filled 2018-08-30 (×3): qty 1
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
## 2018-08-30 NOTE — Telephone Encounter (Signed)
Patient called wanting to speak with you regarding your visit at the hospital. Please follow up with the patient (currently admitted).
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
## 2018-08-30 NOTE — Progress Notes (Signed)
Progress Note  Patient Name: Brad [name] Date of Encounter: 08/30/2018  Primary Cardiologist: [name], MD   Subjective   Feels ok today. Ambulated with PT this morning w/o exertional CP or dyspnea.   Inpatient Medications    Scheduled Meds: . amitriptyline  75 mg Oral QHS  . amLODipine  10 mg Oral Daily  . atorvastatin  40 mg Oral Daily  . folic acid  1 mg Oral Daily  . furosemide  40 mg Intravenous BID  . gabapentin  600 mg Oral BID  . insulin aspart  0-9 Units Subcutaneous TID WC  . insulin glargine  3 Units Subcutaneous Daily  . lidocaine  1 patch Transdermal Q24H  . losartan  25 mg Oral Daily  . multivitamin with minerals  1 tablet Oral Daily  . sodium chloride flush  3 mL Intravenous Q12H  . spironolactone  25 mg Oral Daily  . thiamine  100 mg Oral Daily   Or  . thiamine  100 mg Intravenous Daily   Continuous Infusions: . sodium chloride     PRN Meds: sodium chloride, acetaminophen, alum & mag hydroxide-simeth, diclofenac sodium, ondansetron (ZOFRAN) IV, sodium chloride flush   Vital Signs    Vitals:   08/29/18 1613 08/29/18 1949 08/30/18 0446 08/30/18 0836  BP: (!) 135/92 125/73 116/73 129/88  Pulse: 82 87 75   Resp:  18 (!) 24   Temp: 97.7 F (36.5 C) 98 F (36.7 C) (!) 97.5 F (36.4 C)   TempSrc: Oral Oral Oral   SpO2: 98% 99% 97%   Weight:   82.2 kg   Height:        Intake/Output Summary (Last 24 hours) at 08/30/2018 0847 Last data filed at 08/29/2018 2141 Gross per 24 hour  Intake 963 ml  Output 700 ml  Net 263 ml   Filed Weights   08/28/18 0400 08/29/18 0335 08/30/18 0446  Weight: 88.7 kg 88.8 kg 82.2 kg    Telemetry    Atrial fib 80s - Personally Reviewed  ECG    08/28/18, atrial flutter w/ 3:1 av block - Personally Reviewed  Physical Exam   GEN: No acute distress.   Neck: No JVD Cardiac: irregular rhythm, regular rate no murmurs, rubs, or gallops.  Respiratory: Clear to auscultation bilaterally. GI: Soft, nontender,  non-distended  MS: No edema; No deformity. Neuro:  Nonfocal  Psych: Normal affect   Labs    Chemistry Recent Labs  Lab 08/26/18 0404  08/28/18 0308 08/29/18 0717 08/30/18 0500  NA 127*   < > 132* 134* 135  K 3.4*   < > 3.5 3.7 3.8  CL 93*   < > 94* 97* 99  CO2 23   < > 27 26 25   GLUCOSE 155*   < > 144* 161* 176*  BUN 14   < > 20 21* 26*  CREATININE 1.50*   < > 1.62* 1.63* 1.75*  CALCIUM 8.5*   < > 8.9 8.9 8.8*  PROT 7.5  --   --   --   --   ALBUMIN 2.8*  --   --   --   --   AST 23  --   --   --   --   ALT 16  --   --   --   --   ALKPHOS 53  --   --   --   --   BILITOT 1.0  --   --   --   --  GFRNONAA 50*   < > 45* 45* 41*  GFRAA 58*   < > 52* 52* 48*  ANIONGAP 11   < > 11 11 11    < > = values in this interval not displayed.     Hematology Recent Labs  Lab 08/26/18 0404 08/27/18 0631  WBC 6.2 6.2  RBC 3.24* 3.48*  HGB 10.3* 11.0*  HCT 30.2* 32.7*  MCV 93.2 94.0  MCH 31.8 31.6  MCHC 34.1 33.6  RDW 12.1 12.5  PLT 265 249    Cardiac Enzymes Recent Labs  Lab 08/26/18 2216 08/27/18 1459 08/27/18 2056 08/28/18 0308  TROPONINI 0.03* 0.04* 0.04* 0.04*   No results for input(s): TROPIPOC in the last 168 hours.   BNP Recent Labs  Lab 08/26/18 0404  BNP 953.7*     DDimer No results for input(s): DDIMER in the last 168 hours.   Radiology    US Abdomen Complete  Result Date: 08/29/2018 CLINICAL DATA:  Abdominal distention EXAM: ABDOMEN ULTRASOUND COMPLETE COMPARISON:  CT abdomen and pelvis 08/16/2018 FINDINGS: Gallbladder: Diffuse gallbladder wall thickening and edema with gallbladder wall thickness measured 11 mm. Tiny echogenic foci are poorly visualized but appear non mobile. These could be polyps versus nonmobile stones. Largest measures about 4 mm in diameter. Common bile duct: Diameter: 2.1 mm, normal Liver: Nodular liver contour compatible with cirrhosis. No focal liver lesions. Portal vein is patent on color Doppler imaging with normal direction of  blood flow towards the liver. IVC: No abnormality visualized. Pancreas: Visualized portion unremarkable. Spleen: Size and appearance within normal limits. Right Kidney: Length: 10.6 cm. Echogenicity within normal limits. No mass or hydronephrosis visualized. Left Kidney: Length: 9.5 cm. Echogenicity within normal limits. No mass or hydronephrosis visualized. Abdominal aorta: No aneurysm visualized. Other findings: A small right pleural effusion is identified. IMPRESSION: 1. Diffuse gallbladder wall thickening and edema. Nonspecific finding. This could be inflammatory or related to cirrhosis. Tiny nonmobile stones versus polyps measuring up to 4 mm, likely benign. 2. No bile duct dilatation. 3. Nodular liver contour consistent with cirrhosis. Normal portal venous flow direction. Spleen is not enlarged. 4. Small right pleural effusion. 5. No significant ascites demonstrated. Electronically Signed   By: Lucienne Capers M.D.   On: 08/29/2018 00:14    Cardiac Studies   2D Echo 07/27/18 Study Conclusions  - Left ventricle: The cavity size was normal. Systolic function was   severely reduced. The estimated ejection fraction was in the   range of 20% to 25%. Diffuse hypokinesis. Doppler parameters are   consistent with a reversible restrictive pattern, indicative of   decreased left ventricular diastolic compliance and/or increased   left atrial pressure (grade 3 diastolic dysfunction). - Aortic valve: There was trivial regurgitation. Valve area (VTI):   2.4 cm^2. Valve area (Vmean): 2.81 cm^2. - Mitral valve: There was moderate regurgitation. - Left atrium: The atrium was severely dilated. - Right ventricle: The cavity size was mildly dilated. Wall   thickness was normal. - Right atrium: The atrium was moderately dilated. - Tricuspid valve: There was mild regurgitation. - Pulmonary arteries: Systolic pressure was moderately increased.   PA peak pressure: 50 mm Hg (S).  Patient Profile     57 y.o.  male with a hx of HTN,non-ischemiccardiomyopathy(EF 20-25%, turned down for ICD),atrial fibrillationon Eliquis, DM 2, hyperlipidemia, history of IV drugabuse, ongoing cocaine anduse,CKD stage III,hepatitis Ccirrhosisandnon-complaincewho is being seen for the evaluation ofacute CHF exacerbationat the request ofDr. Tammi Klippel.  Assessment & Plan    1.  Acute on Chronic Systolic CHF: treated with diuretics but appears euvolemic on exam today. No LEE. Lungs are CTAB. Breathing comfortably on RA. Can stop IV Lasix and transition back to PO. He has had a slight bump in SCr from 1.6>>1.75 (but still within baseline). He will need a f/u BMP as outpatient to reassess. He may benefit from H/H monitoring of volume status (daily weight monitoring by RN). Low salt diet advised.   2. Nonischemic Cardiomyopathy: Last cardiac catheterization on 07/07/2016 showed mild nonobstructive CAD. EF 20-25%. Seen by EP previous admission 08/17/18 and felt not a candidate for ICD due to ongoing cocaine use. Continue medical therapy. He is currently on an ARB and spironolactone.   3. Atrial Fibrillation/Flutter: per EP's recommendations previous admission, given atypical atrial flutter and afib in the setting of significant atrial enlargement, in addition to liver diease being a contraindication for amiodarone, rate control therapy is recommended for long term management. He was previously on coreg at time of last admit in August, but no BB ordered.?? Rate however appears to be controlled. Continue to monitor on tele. Add back coreg if need. Avoid Cardizem given low EF. He was also previously on Eliquis, but anticoagulation on hold due to high fall risk.   4. Stage III CKD: SCr rising with diuresis, up from 1.62>>1.63>>1.75. Will need close outpatient monitoring w/ repeat BMP  5. Hyponatremia: resolved. Na 135 today.   6. Elevated Troponin: flat, low level trend, likely 2/2 demand ischemia from CHF. 0.04>>0.04>>0.04. No  plans for ischemic w/u.   For questions or updates, please contact [place] Please consult [email or web address] for contact info under        Signed, Lyda Jester, PA-C  08/30/2018, 8:47 AM
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
## 2018-08-30 NOTE — Discharge Summary (Addendum)
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 [facility] Follow up.   Specialty:  Home Health Services Why:  Physical Therapy Contact information: [address] [place] [address] [place]. Go on 09/06/2018.   Why:  at 1:50pm for an appointment with Dr Denita Lung information: [place] [id number] [place], [place], DO 08/31/2018, 2:50 PM PGY-1, [place]  I saw the patient on the day of discharge.  I discussed with the full resident team in rounds.  I agree with the documentation and management of Dr. Ouida Sills.  I believe we that the medical issues under good control.  I am very concerned about his social situation, which is unsettled at the time of DC.  This poor social situation puts him at high risk for non  compliance and for alcohol and drug abuse relapse.  I wish we had better options for him.
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
## 2018-08-31 ENCOUNTER — Telehealth: Payer: Self-pay

## 2018-08-31 DIAGNOSIS — R140 Abdominal distension (gaseous): Secondary | ICD-10-CM

## 2018-08-31 LAB — BASIC METABOLIC PANEL
Anion gap: 10 (ref 5–15)
BUN: 27 mg/dL — ABNORMAL HIGH (ref 6–20)
CO2: 28 mmol/L (ref 22–32)
Calcium: 9 mg/dL (ref 8.9–10.3)
Chloride: 97 mmol/L — ABNORMAL LOW (ref 98–111)
Creatinine, Ser: 1.72 mg/dL — ABNORMAL HIGH (ref 0.61–1.24)
GFR calc Af Amer: 49 mL/min — ABNORMAL LOW (ref >60)
GFR calc non Af Amer: 42 mL/min — ABNORMAL LOW (ref >60)
Glucose, Bld: 158 mg/dL — ABNORMAL HIGH (ref 70–99)
Potassium: 4 mmol/L (ref 3.5–5.1)
Sodium: 135 mmol/L (ref 135–145)

## 2018-08-31 LAB — GLUCOSE, CAPILLARY
Glucose-Capillary: 145 mg/dL — ABNORMAL HIGH (ref 70–99)
Glucose-Capillary: 183 mg/dL — ABNORMAL HIGH (ref 70–99)
Glucose-Capillary: 172 mg/dL — ABNORMAL HIGH (ref 70–99)
Glucose-Capillary: 152 mg/dL — ABNORMAL HIGH (ref 70–99)

## 2018-08-31 MED ORDER — LOSARTAN POTASSIUM 25 MG PO TABS: 25.0000 mg | ORAL_TABLET | Freq: Every day | ORAL | 0 refills | Status: DC

## 2018-08-31 MED ORDER — THIAMINE HCL 100 MG PO TABS: 100.0000 mg | ORAL_TABLET | Freq: Every day | ORAL | 0 refills | Status: DC

## 2018-08-31 MED ORDER — CARVEDILOL 3.125 MG PO TABS
3.1250 mg | ORAL_TABLET | Freq: Two times a day (BID) | ORAL | Status: DC
  Administered 2018-08-31 – 2018-09-01 (×2): 3.125 mg via ORAL
  Filled 2018-08-31 (×2): qty 1

## 2018-08-31 MED ORDER — SPIRONOLACTONE 25 MG PO TABS: 25.0000 mg | ORAL_TABLET | Freq: Every day | ORAL | 0 refills | Status: DC

## 2018-08-31 MED ORDER — CARVEDILOL 3.125 MG PO TABS: 3.1250 mg | ORAL_TABLET | Freq: Two times a day (BID) | ORAL | 0 refills | Status: DC

## 2018-08-31 MED ORDER — FUROSEMIDE 40 MG PO TABS: 40.0000 mg | ORAL_TABLET | Freq: Two times a day (BID) | ORAL | 0 refills | Status: DC
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
## 2018-08-31 NOTE — Telephone Encounter (Addendum)
Call placed to [facility] regarding housing # [phone] x [phone] and a message was left for [facility] requesting a call back to this CM # [phone]. Call placed to [facility] in attempt to speak to Mr Brad [name], Mud[place].  The secretary who answered stated that he was with a client and asked that this CM call back in a few minutes   Call placed to [facility] # [phone], message left for [place] regardig housing, requesting a call back to # [phone].  Call received from the patient. He explained that thinks he has spoken to a landlord of an apartment on [address]. He said that he will need to provide an ID and social security card both of which he will need to replace as they have been lost.  He said that he explained to the landlord that he is a felon and he said that the landlord is willing to work with him.  Call placed to [facility] # [phone]  to inquire about the admission process and availability for the patient to participate in their program. Voicemail message left requesting a call back to # [phone].  Met with patient, Brad Emms, LCSW.  The patient stated that he will not go to [facility] because he would have to work and he is unable to work .  He continues to express concerns about being discharged and having to return to the house that he had been living in.  At his request call was placed to social security # [phone]. This CM spoke to Brad [name] an explained that the patient is requesting that some of his back pay disability money that is due to him 10/2018 be released to him earlier so that he could pay for a motel room while he looks for an apartment. The patient stated that he has no money left from this month's disability check for 08/2018. Marland Kitchen  Brad [name] then spoke to Brad [name] at [employer] as the patient stated that he has worked with her in the past. Brad [name] explained that a letter from  the hospital needs to be faxed to her at the social security office noting how much money he is requesting and they will review the request   Brad [name] stated that she would draft the letter. The patient's probation officer, Brad [name] was also present for the meeting.  She offered to provide some assistance with helping the patient find housing. She provided him with a list of resources for possible housing. She also said that she would check to see if they have any emergency funds in her office to assist with paying for the motel .  Shje suggested that he stay at [address]. She said that the rate is $230/week.  She cautioned the patient about staying at other motels due to safety, cleanliness and activity around the motels. As of this afternoon he is still waiting to hear from her to confirm that the funding has been approved.    Brad Krauss, Rn CM notified that the patient has his medications filled at [facility] but the delivery location will need to be confirmed prior to his discharge to insure delivery to the correct address

## 2018-08-31 NOTE — Telephone Encounter (Signed)
Call received from the patient. He said that he is not being discharged today. He also said that he spoke to Emily/ probation officer and informed her that the weekly rate for [address] is > $400.  He said that she told him that she is still going to help him so that he will not be on the street.  He also said that the letter was faxed to social security by Claudie Revering.

## 2018-08-31 NOTE — Plan of Care (Signed)
  Problem: Education: Goal: Knowledge of General Education information will improve Description Including pain rating scale, medication(s)/side effects and non-pharmacologic comfort measures Outcome: Progressing Note:  POC reviewed with pt.   

## 2018-08-31 NOTE — Care Management Note (Addendum)
Case Management Note  Patient Details  Name: Brad [name] MRN: [id number] Date of Birth: 15-Feb-1961  Subjective/Objective:  Pt presented for shortness of breath and chest pain. CHF exacerbation- was initiated on IV Lasix now transitioned to po. Pt was previously staying with a friend and the environment has not been the best. Patient with housing issues and this is causing the high risk for readmission. Patient will be able to utilize the [facility] for hospital follow up visits. [name] with [facility] has been following the patient in the hospital and providing resources to the patient. Hospital Follow up has been scheduled by [name] with [facility].                  Action/Plan: CSW assisting the patient with housing resources. Patient was discussed in the [place] and we discussed the possibility of [employer]- not for profit. CSW did reach out to [facility] and patient is not a candidate 2/2 diagnosis CHF and the strenuous work they may have to do in order to stay in the program. [place] looking into [facility] in [place]. Patient making several calls to [facility]. CM will continue to monitor for additional needs.   Expected Discharge Date:               Expected Discharge Plan:  [place]  In-House Referral:  Clinical Social Work  Discharge planning Services  CM Consult  Post Acute Care Choice:  Home Health, Resumption of Svcs/PTA Provider Choice offered to:  Patient  DME Arranged:  N/A DME Agency:  NA  HH Arranged:  PT [place] Agency:  [employer]  Status of Service:  Completed, signed off  If discussed at [place] of Stay Meetings, dates discussed:    Additional Comments: 1521 09-01-18 Jacqlyn Krauss, RN,BSN [phone] Probation Officer Raquel Sarna assisted placing patient in temporary housing at [address] [facility] is aware that patient will be at this location. Patient will be able to pick  medications up at pharmacy on the way to hotel. F/u will be at the transitional care clinic. No further needs from CM at this time.  Bethena Roys, RN 08/31/2018, 11:22 AM
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
## 2018-09-01 ENCOUNTER — Ambulatory Visit: Payer: Medicaid Other | Admitting: Family Medicine

## 2018-09-01 ENCOUNTER — Telehealth: Payer: Self-pay

## 2018-09-01 DIAGNOSIS — R140 Abdominal distension (gaseous): Secondary | ICD-10-CM

## 2018-09-01 LAB — GLUCOSE, CAPILLARY
Glucose-Capillary: 156 mg/dL — ABNORMAL HIGH (ref 70–99)
Glucose-Capillary: 166 mg/dL — ABNORMAL HIGH (ref 70–99)

## 2018-09-01 NOTE — Discharge Summary (Signed)
[facility] Discharge Summary  Patient name: Brad [name] Medical record number: [id number] Date of birth: October 08, 1961 Age: 57 y.o. Gender: male Date of Admission: 08/26/2018  Date of Discharge: 09/01/2018 Admitting Physician: Zenia Resides, MD  Primary Care Provider: Charlott Rakes, MD Consultants: Cards  Indication for Hospitalization: Chest Pain  Discharge Diagnoses/Problem List:  CHF (EF 20-25%) chronic hyponatremia A fib/A flutter CKD III T2DM with diabetic neuropathy HTN Depression Liver cirrhosis Polysubstance abuse (alcohol, cocaine)  tobacco abuse GERD  Disposition: Discharge to temporary housing with close outpatient follow up  Discharge Condition: Stable  Discharge Exam:  Physical Exam  Constitutional: He is oriented to person, place, and time. He appears well-developed and well-nourished.  HENT:  Head: Normocephalic.  Right posterior-parietal scalp laceration with spotty bleeding, clean, healing  Eyes: EOM are normal.  Neck: Normal range of motion.  Cardiovascular: Normal rate and normal pulses. An irregularly irregular rhythm present. Exam reveals no S3 and no S4.  Pulmonary/Chest: Effort normal and breath sounds normal.  Abdominal: Soft. Bowel sounds are normal. He exhibits no distension and no ascites. There is no tenderness.  Musculoskeletal: Normal range of motion.       Right lower leg: Normal. He exhibits edema (Trace pitting).       Left lower leg: He exhibits no edema.  Neurological: He is oriented to person, place, and time.  Skin: Skin is warm and dry. Capillary refill takes less than 2 seconds.  Psychiatric: He has a normal mood and affect.   Brief Hospital Course:  [name] is a 57 year old gentleman with a past history significant for HFrEF 20-25% and atrial fibrillation that presented in acute on chronic heart failure exacerbation with increasing shortness of breath and 20+ weight gain over the past few  weeks in the setting of recent cocaine use. Fluid overloaded on exam. Cardiology consulted, felt he is not a good candidate for ICD placement given his active cocaine use and medical non-compliance. Symptoms slowly resolved with IV lasix therapy 40mg  BID, transitioned to oral on 9/8. Troponin trended flat, EKG without ischemic changes. Avoided BB due to cocaine use, however started on low dose ARB and spironolactone during his stay. He had occasional left-sided shoulder and back pains that were MSK in nature and were able to be controlled with tylenol, lidocaine patches, and heating pads. At discharge, he was hemodynamically stable and without further SOB or chest pain. He was medically cleared for discharge on 09/01/2018.  Issues for Follow Up:  1. Discontinued his Eliquis for Atrial fibrillation given high risk for falls and self anti-coagulation with liver cirrhosis.   2. Safer housing options away from drug use and abuse. 3. Stop using cocaine and other illicit substances. Patient continues to drink three 40s daily which he knows is not OK with heart failure and cirrhosis. 4. Cardiology approves of discharging the patient home on Coreg 3.125mg .  Significant Procedures:  Right parietal area laceration staple removal on 9/6 (placed on 8/26)  Significant Labs and Imaging:  Recent Labs  Lab 08/26/18 0404 08/27/18 0631  WBC 6.2 6.2  HGB 10.3* 11.0*  HCT 30.2* 32.7*  PLT 265 249   Recent Labs  Lab 08/26/18 0404 08/27/18 0631 08/28/18 0308 08/29/18 0717 08/30/18 0500 08/31/18 0621  NA 127* 129* 132* 134* 135 135  K 3.4* 3.7 3.5 3.7 3.8 4.0  CL 93* 93* 94* 97* 99 97*  CO2 23 24 27 26 25 28   GLUCOSE 155* 189* 144* 161* 176*  158*  BUN 14 22* 20 21* 26* 27*  CREATININE 1.50* 1.67* 1.62* 1.63* 1.75* 1.72*  CALCIUM 8.5* 8.5* 8.9 8.9 8.8* 9.0  MG  --   --   --  1.5* 1.9  --   ALKPHOS 53  --   --   --   --   --   AST 23  --   --   --   --   --   ALT 16  --   --   --   --   --   ALBUMIN  2.8*  --   --   --   --   --    US Abdomen Complete: 08/29/2018 CLINICAL DATA:  Abdominal distention EXAM: ABDOMEN ULTRASOUND COMPLETE COMPARISON:  CT abdomen and pelvis 08/16/2018 FINDINGS: Gallbladder: Diffuse gallbladder wall thickening and edema with gallbladder wall thickness measured 11 mm. Tiny echogenic foci are poorly visualized but appear non mobile. These could be polyps versus nonmobile stones. Largest measures about 4 mm in diameter. Common bile duct: Diameter: 2.1 mm, normal Liver: Nodular liver contour compatible with cirrhosis. No focal liver lesions. Portal vein is patent on color Doppler imaging with normal direction of blood flow towards the liver. IVC: No abnormality visualized. Pancreas: Visualized portion unremarkable. Spleen: Size and appearance within normal limits. Right Kidney: Length: 10.6 cm. Echogenicity within normal limits. No mass or hydronephrosis visualized. Left Kidney: Length: 9.5 cm. Echogenicity within normal limits. No mass or hydronephrosis visualized. Abdominal aorta: No aneurysm visualized. Other findings: A small right pleural effusion is identified. IMPRESSION: 1. Diffuse gallbladder wall thickening and edema. Nonspecific finding. This could be inflammatory or related to cirrhosis. Tiny nonmobile stones versus polyps measuring up to 4 mm, likely benign. 2. No bile duct dilatation. 3. Nodular liver contour consistent with cirrhosis. Normal portal venous flow direction. Spleen is not enlarged. 4. Small right pleural effusion. 5. No significant ascites demonstrated. Electronically Signed   By: Lucienne Capers M.D.   On: 08/29/2018 00:14   Dg Chest Portable 1 View: 08/26/2018 CLINICAL DATA:  Chest pain, shortness of breath, nausea and vomiting, and bilateral leg edema for 3 days. EXAM: PORTABLE CHEST 1 VIEW COMPARISON:  08/20/2018 FINDINGS: Cardiac enlargement. No vascular congestion or edema. Persistent patchy opacity in the right upper lung has a somewhat branching  configuration, possibly indicating bronchopneumonia or mucous plugging. No blunting of costophrenic angles. No pneumothorax. Mediastinal contours appear intact. Calcification of the aorta. IMPRESSION: Cardiac enlargement. No vascular congestion or edema. Persistent patchy opacity in the right upper lung may represent bronchopneumonia or mucous plugging. Electronically Signed   By: Lucienne Capers M.D.   On: 08/26/2018 04:17   Results/Tests Pending at Time of Discharge: None  Discharge Medications:  Allergies as of 09/01/2018      Reactions   Lisinopril Anaphylaxis, Other (See Comments)   Throat swells   Pamelor [nortriptyline Hcl] Anaphylaxis, Swelling   Throat swells   Angiotensin Receptor Blockers Other (See Comments)   (Angioedema with lisinopril, therefore ARB's are contraindicated)      Medication List    STOP taking these medications   apixaban 5 MG Tabs tablet Commonly known as:  ELIQUIS   cloNIDine 0.1 MG tablet Commonly known as:  CATAPRES     TAKE these medications   amitriptyline 75 MG tablet Commonly known as:  ELAVIL Take 1 tablet (75 mg total) by mouth at bedtime.   amLODipine 10 MG tablet Commonly known as:  NORVASC Take 1 tablet (10 mg total) by  mouth daily.   atorvastatin 40 MG tablet Commonly known as:  LIPITOR Take 1 tablet (40 mg total) by mouth daily.   carvedilol 3.125 MG tablet Commonly known as:  COREG Take 1 tablet (3.125 mg total) by mouth 2 (two) times daily with a meal. What changed:    medication strength  how much to take   diclofenac sodium 1 % Gel Commonly known as:  VOLTAREN Apply 4 g topically 4 (four) times daily as needed (to painful sites).   furosemide 40 MG tablet Commonly known as:  LASIX Take 1 tablet (40 mg total) by mouth 2 (two) times daily. What changed:    medication strength  how much to take  when to take this   gabapentin 300 MG capsule Commonly known as:  NEURONTIN Take 2 capsules (600 mg total) by mouth  2 (two) times daily. What changed:  how much to take   glucose blood test strip Use as instructed   hydrocortisone 2.5 % cream Apply 1 application topically 2 (two) times daily as needed (rash).   Insulin Glargine 100 UNIT/ML Solostar Pen Commonly known as:  LANTUS Inject 35 Units into the skin 2 (two) times daily.   losartan 25 MG tablet Commonly known as:  COZAAR Take 1 tablet (25 mg total) by mouth daily.   omeprazole 20 MG capsule Commonly known as:  PRILOSEC Take 1 capsule (20 mg total) by mouth daily. What changed:    when to take this  reasons to take this   PEN NEEDLES 31GX5/16" 31G X 8 MM Misc Use as directed   spironolactone 25 MG tablet Commonly known as:  ALDACTONE Take 1 tablet (25 mg total) by mouth daily.   thiamine 100 MG tablet Take 1 tablet (100 mg total) by mouth daily.       Discharge Instructions: Please refer to Patient Instructions section of EMR for full details.  Patient was counseled important signs and symptoms that should prompt return to medical care, changes in medications, dietary instructions, activity restrictions, and follow up appointments.   Follow-Up Appointments: Follow-up Information    [facility] Follow up.   Specialty:  Catering manager information: [facility] [employer] [place] [address] [phone]        [facility] Follow up.   Specialty:  Behavioral Health Why:  peer support program Contact information: [place] [address] [phone]        [facility] Follow up.   Specialty:  [facility] information: [place] [address] [phone]        [facility] Follow up.   Specialty:  Home Health Services Why:  Physical Therapy Contact information: [address] [place] [address] [place]. Go on 09/06/2018.   Why:  at 1:50pm for an appointment with Dr Denita Lung information: [place] [id number] [place], PA-C Follow up on 09/17/2018.   Specialties:  Cardiology, Radiology Why:  1:30 PM After hospital follow visit with Dr. Lysbeth Penner PA Contact information: [place] [address] [phone]           Daisy Floro, DO 09/01/2018, 3:03 PM PGY-1, [place]
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
## 2018-09-01 NOTE — Plan of Care (Signed)
  Problem: Education: Goal: Knowledge of General Education information will improve Description Including pain rating scale, medication(s)/side effects and non-pharmacologic comfort measures Outcome: Progressing Note:  POC reviewed with pt.   

## 2018-09-01 NOTE — Progress Notes (Addendum)
4:06 pm CSW received call back from patient's PO, who confirmed patient will go to [facility] [place] on [facility]. Patient to discharge today and friend will provide transportation. CSW signing off.  2:48 pm CSW called patient's PO, Hedwig Morton, this morning and left voicemail requesting call back about their efforts to have patient placed temporarily in a hotel, while patient works on more permanent housing.   CSW met with patient at bedside. Patient indicated PO had called him and reported that the funds for the hotel room are approved, and he will be staying at the [place] on [facility]. Patient can go to the hotel today. Patient stated his friend, Mr. Ace Gins, will drive him to the hotel. Mr. Ace Gins is currently at the hospital visiting another friend and is immediately available to drive patient to the hotel.  Mr. Ace Gins will also take patient to his pharmacy to pick up his medications.  Paged MD for discharge orders.  Estanislado Emms, [place]

## 2018-09-01 NOTE — Discharge Instructions (Signed)
1. Stop taking Clonidine. 2. Stop taking Eliquis. 3. Limit liquid intake - this includes not drinking 40s! 4. Stay away from cocaine and other illicit substances. These can make your heart failure and liver problems worse.  5. Take Carvedilol/Coreg 3.125mg .

## 2018-09-01 NOTE — Telephone Encounter (Signed)
Multiple calls received from the patient today. He was very anxious waiting to hear from his probation officer about the motel stay after discharge. He expressed concerns about having to return to his prior residence.  By this afternoon, he stated that he had spoken to [facility] and she had received approval from her supervisor to pay for a motel stay for the patient. He also said that his friend/Pastor, Mr Ace Gins will be picking him up when he is discharged and taking him to [place] to pick up his medications. He stated that he understands the importance of abstaining from cocaine/alcohol and adhering to his medication regime and recommendation for medical follow up.   This CM spoke to Estanislado Emms, LCSW who stated that the patient will be going to [facility] [place] on [address]. She was not sure how long he would be staying there, She confirmed his friend would be taking him to the pharmacy to get his medications. She also noted that [facility] has called and he will need to call [facility] to schedule an assessment.

## 2018-09-01 NOTE — Progress Notes (Signed)
Patient received discharge information and acknowledged understanding of it. Patient IV was removed.  

## 2018-09-02 ENCOUNTER — Telehealth: Payer: Self-pay

## 2018-09-02 NOTE — Telephone Encounter (Signed)
Transition Care Management Follow-up Telephone Call  Message received from patient and call returned to patient.     Date of discharge and from where: 09/01/2018 - [facility]  How have you been since you were released from the hospital? He said he has been ok. Currently staying at the [facility] and he said he would be there for a week.   Any questions or concerns? He is concerned about food. Said that he had food this afternoon and has some for tonight.  Reminded him to have the free breakfast offered in the morning.  He said that he spoke to his bank and received a new credit card and hopefully will soon receive the $500 back that was taken from his account. Please refer to this CM notes from yesterday for more information. He said that Mr Ace Gins took him to the [facility] today to pick up his mail.  This CM reminded him of the importance making an effort to look for an apartment as the week's stay will go by very quickly. He has the list of landlords to call. This CM told him to start making some calls this afternoon   He noted that he didn't realize the motel was no smoking and he smoked in the room yesterday and this morning and the front desk person and his probation officer told him that next time he smokes in the room they will call the police. He stated that he understood the severity of the issue and will not smoke again.   He said that he is not sure how far he is from a bus stop but will find that our tomorrow.   Items Reviewed:  Did the pt receive and understand the discharge instructions provided? He stated that he has them and has no questions.   Medications obtained and verified? He said that he has all of his  Medications and the written instructions how/when to take them. He said that the PT that came out to see him today reviewed all of the medications. He did not have any questions about his medications and said that he knows what is new/what is changed and what has been  discontinued.   Any new allergies since your discharge? None reported   Do you have support at home? He is currently staying at a motel funded by his Engineer, manufacturing systems.  He has little support in the community. His friend/Pastror, Mr Ace Gins has been his primary support.  Other (ie: DME, Home Health, etc) He said that the PT from [facility] came to see him today.  He has a cane. His glucometer is at this prior residence and he will need to pick that up or a new one may need to be ordered if he is eligible for a new glucometer.   Functional Questionnaire: (I = Independent and D = Dependent) ADL's:I  Bathing/Dressing- I   Meal Prep- I  Eating- I  Maintaining continence- I  Transferring/Ambulation- I  Managing Meds- I   Follow up appointments reviewed:    PCP Hospital f/u appt confirmed? Yes - 09/06/18 @ 1350.   [facility] f/u appt confirmed? No specialist appointments scheduled yet.  Are transportation arrangements needed?  Yes.[place] to arrange cab transportation to the PCP appointment.   If their condition worsens, is the pt aware to call  their PCP or go to the ED?  yes  Was the patient provided with contact information for the PCP's office or ED? He has # for [facility]  and knows to call 911 if appropriate Was the pt encouraged to call back with questions or concerns? Yes he has been encouraged to call.

## 2018-09-03 ENCOUNTER — Telehealth: Payer: Self-pay

## 2018-09-03 ENCOUNTER — Telehealth: Payer: Self-pay | Admitting: Family Medicine

## 2018-09-03 NOTE — Telephone Encounter (Signed)
Attempted to contact patient to remind him of his appointment on 09/06/18 @ 1350 and confirm need to cab to clinic. Also need to inquire if he has been trying to contact landlords about apartment rentals.  Call placed to [place] # [phone], the receptionist said that the line was busy, Unable to leave a message.

## 2018-09-03 NOTE — Telephone Encounter (Signed)
Someone from shoemaker called to get verbal orders for patient. Please follow up.

## 2018-09-03 NOTE — Telephone Encounter (Addendum)
Attempted again to contact the patient to remind him of appointment 09/06/18 and to check the status of his search for housing. Call placed to [facility], the receptionist said he had just got into a cab. Left message with receptionist for him noting that this CM called and will call him again 09/06/18

## 2018-09-06 ENCOUNTER — Telehealth: Payer: Self-pay

## 2018-09-06 ENCOUNTER — Ambulatory Visit: Payer: Medicaid Other | Admitting: Family Medicine

## 2018-09-06 NOTE — Telephone Encounter (Addendum)
Attempted to contact the patient to remind him of his appointment this afternoon and to inform him that the cab will be picking him up around 1300.  Call placed to Days [facility] and the receptionist said that it is difficult to transfer calls to the room. She said that they will get the message to him to call this CM back asap.  She had the clinic [phone].   Call received from the patient. He said that he needs to cancel his appointment today because he has to meet with his probation officer's supervisor  for a urine test. He said that Raquel Sarna, his probation officer is on vacation. The appointment has been rescheduled for 09/08/18 @ 0930. Informed him that [facility] will provide cab transportation to the clinic for him and will contact him tomorrow to confirm the appointment and need to a ride to the clinic.  He said that his cell phone is now working  - best # to reach him # [phone] (M)  He then explained that he received some of the back pay - $1600 from social security and has been able to buy clothes and food. Reminded him that he needs to have a plan for housing as his probation officer had planned for paying for the motel for 1 week and that is up this week. He does not have a confirmed plan at this time  He said that he still plans to meet with [facility] tomorrow but realizes that there is a waiting list even if he is approved. He also said that he has been in contact with the landlord of the apartment on [address] and he needs to meet with him .   This CM strongly encouraged him to continue to search for housing options as his stay at the motel is about to end unless the probation officer will extend his stay while he looks for an apartment. He should not want to spend all of his money to stay at the current motel as it is expensive.

## 2018-09-07 ENCOUNTER — Telehealth: Payer: Self-pay

## 2018-09-07 NOTE — Telephone Encounter (Signed)
Message received that patient called and requested a call back.   Call returned to the patient. He said that he went to orientation at [facility] today and signed the necessary paperwork.  He explained that he was initially told that the wait would be 3 months but he then explained his medical conditions and was told that he can move into an apartment on Monday 09/13/18. He stated that he understands that there is no smoking or alcohol allowed in the apartment.  He said that he didn't see the probation officer yesterday because he was not feeling well. He called the probation officer today and explained that he plans to move into the apartment on Monday. He was supposed to leave the Days [facility] tomorrow but he said that the probation officer agreed to allow him to stay until Monday.   He said that he saw a urologist yesterday but did not explain why. He also confirmed his appointment at [facility] tomorrow @ [phone]. He spoke to [place], [place] who informed him that she would arrange cab transportation to the clinic-pick up time 0830.

## 2018-09-08 ENCOUNTER — Telehealth: Payer: Self-pay

## 2018-09-08 ENCOUNTER — Ambulatory Visit: Payer: Medicaid Other | Attending: Family Medicine | Admitting: Family Medicine

## 2018-09-08 VITALS — BP 116/76 | HR 110 | Temp 98.0°F | Ht 71.0 in | Wt 180.8 lb

## 2018-09-08 DIAGNOSIS — Z794 Long term (current) use of insulin: Secondary | ICD-10-CM | POA: Diagnosis not present

## 2018-09-08 DIAGNOSIS — Z888 Allergy status to other drugs, medicaments and biological substances status: Secondary | ICD-10-CM | POA: Diagnosis not present

## 2018-09-08 DIAGNOSIS — I130 Hypertensive heart and chronic kidney disease with heart failure and stage 1 through stage 4 chronic kidney disease, or unspecified chronic kidney disease: Secondary | ICD-10-CM | POA: Insufficient documentation

## 2018-09-08 DIAGNOSIS — I10 Essential (primary) hypertension: Secondary | ICD-10-CM

## 2018-09-08 DIAGNOSIS — K7030 Alcoholic cirrhosis of liver without ascites: Secondary | ICD-10-CM | POA: Diagnosis not present

## 2018-09-08 DIAGNOSIS — E118 Type 2 diabetes mellitus with unspecified complications: Secondary | ICD-10-CM

## 2018-09-08 DIAGNOSIS — Z79899 Other long term (current) drug therapy: Secondary | ICD-10-CM | POA: Diagnosis not present

## 2018-09-08 DIAGNOSIS — E1140 Type 2 diabetes mellitus with diabetic neuropathy, unspecified: Secondary | ICD-10-CM | POA: Insufficient documentation

## 2018-09-08 DIAGNOSIS — IMO0002 Reserved for concepts with insufficient information to code with codable children: Secondary | ICD-10-CM

## 2018-09-08 DIAGNOSIS — Z7901 Long term (current) use of anticoagulants: Secondary | ICD-10-CM | POA: Diagnosis not present

## 2018-09-08 DIAGNOSIS — E1165 Type 2 diabetes mellitus with hyperglycemia: Secondary | ICD-10-CM | POA: Insufficient documentation

## 2018-09-08 DIAGNOSIS — N183 Chronic kidney disease, stage 3 unspecified: Secondary | ICD-10-CM

## 2018-09-08 DIAGNOSIS — I5022 Chronic systolic (congestive) heart failure: Secondary | ICD-10-CM | POA: Insufficient documentation

## 2018-09-08 DIAGNOSIS — I482 Chronic atrial fibrillation, unspecified: Secondary | ICD-10-CM

## 2018-09-08 DIAGNOSIS — I509 Heart failure, unspecified: Secondary | ICD-10-CM | POA: Diagnosis present

## 2018-09-08 DIAGNOSIS — F1410 Cocaine abuse, uncomplicated: Secondary | ICD-10-CM | POA: Insufficient documentation

## 2018-09-08 DIAGNOSIS — E1122 Type 2 diabetes mellitus with diabetic chronic kidney disease: Secondary | ICD-10-CM | POA: Diagnosis not present

## 2018-09-08 DIAGNOSIS — Z1211 Encounter for screening for malignant neoplasm of colon: Secondary | ICD-10-CM

## 2018-09-08 DIAGNOSIS — E1121 Type 2 diabetes mellitus with diabetic nephropathy: Secondary | ICD-10-CM | POA: Diagnosis not present

## 2018-09-08 LAB — GLUCOSE, POCT (MANUAL RESULT ENTRY): POC Glucose: 179 mg/dl — AB (ref 70–99)

## 2018-09-08 MED ORDER — FUROSEMIDE 40 MG PO TABS: 40.0000 mg | ORAL_TABLET | Freq: Two times a day (BID) | ORAL | 3 refills | Status: DC

## 2018-09-08 MED ORDER — CARVEDILOL 3.125 MG PO TABS: 3.1250 mg | ORAL_TABLET | Freq: Two times a day (BID) | ORAL | 3 refills | Status: DC

## 2018-09-08 MED ORDER — GABAPENTIN 300 MG PO CAPS: 600.0000 mg | ORAL_CAPSULE | Freq: Two times a day (BID) | ORAL | 3 refills | Status: DC

## 2018-09-08 MED ORDER — LOSARTAN POTASSIUM 25 MG PO TABS: 25.0000 mg | ORAL_TABLET | Freq: Every day | ORAL | 3 refills | Status: DC

## 2018-09-08 MED ORDER — AMLODIPINE BESYLATE 10 MG PO TABS: 10.0000 mg | ORAL_TABLET | Freq: Every day | ORAL | 5 refills | Status: DC

## 2018-09-08 MED ORDER — INSULIN GLARGINE 100 UNIT/ML SOLOSTAR PEN: 35.0000 [IU] | PEN_INJECTOR | Freq: Two times a day (BID) | SUBCUTANEOUS | 5 refills | Status: DC

## 2018-09-08 MED ORDER — SPIRONOLACTONE 25 MG PO TABS: 25.0000 mg | ORAL_TABLET | Freq: Every day | ORAL | 3 refills | Status: DC
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
## 2018-09-08 NOTE — Telephone Encounter (Signed)
Met with the patient when he was in the clinic today for his appointment.  He was very concerned about his housing situation.  When questioned about extending his stay at the [place] and moving into [facility] on Monday, 09/13/18 as he had explained yesterday, he said that he was not sure about that.  He said that he will need to call [facility] to check on his application status.   He said that he is supposed to leave the motel by 1100 today. He noted that he needs to call his probation officer's supervisor at 1000 and he attempted multiple times to contact the supervisor prior to leaving the clinic.  He said that he is hoping that the probation officer will continue to pay for him to stay at a motel but he also said that the motel where he is currently staying is booked Saturday and Sunday nights this weekend.  He did confirm that he has money in the bank that he received from social security as back pay from his disability claim.  He verbalized that he will not be able to afford where he is currently staying and will need to find another motel.  He was anxious to get back to his motel and the cab was called for transport back. He said that he would call this CM back when he has more information about his housing plans.

## 2018-09-08 NOTE — Telephone Encounter (Signed)
Attempted to contact the patient to inquire about the outcome of his housing situation today. Call placed to # [phone] (M) and a message was left requesting a call back to # [phone].

## 2018-09-09 ENCOUNTER — Telehealth: Payer: Self-pay

## 2018-09-09 ENCOUNTER — Encounter: Payer: Self-pay | Admitting: Family Medicine

## 2018-09-09 LAB — BASIC METABOLIC PANEL
BUN/Creatinine Ratio: 8 — ABNORMAL LOW (ref 9–20)
BUN: 15 mg/dL (ref 6–24)
CO2: 20 mmol/L (ref 20–29)
Calcium: 9.1 mg/dL (ref 8.7–10.2)
Chloride: 98 mmol/L (ref 96–106)
Creatinine, Ser: 1.85 mg/dL — ABNORMAL HIGH (ref 0.76–1.27)
GFR calc Af Amer: 45 mL/min/{1.73_m2} — ABNORMAL LOW (ref >59)
GFR calc non Af Amer: 39 mL/min/{1.73_m2} — ABNORMAL LOW (ref >59)
Glucose: 178 mg/dL — ABNORMAL HIGH (ref 65–99)
Potassium: 4.4 mmol/L (ref 3.5–5.2)
Sodium: 134 mmol/L (ref 134–144)

## 2018-09-09 NOTE — Telephone Encounter (Signed)
Patient was called and the number on file is not in service.   If patient call the office please inform patient of lab results below.

## 2018-09-09 NOTE — Telephone Encounter (Signed)
-----   Message from Charlott Rakes, MD sent at 09/09/2018  8:42 AM EDT ----- Kidney function shows a decline which is not new but is from his chronic kidney disease.  Please encourage adherence with medications and avoid NSAIDs.

## 2018-09-13 ENCOUNTER — Telehealth: Payer: Self-pay

## 2018-09-13 NOTE — Telephone Encounter (Signed)
Call received from the patient. Stated that he wanted to know when home health PT was coming out again. Inquired about his address and he said that he would call [facility] when he knows the address.   Call placed to [facility] , spoke to [place] who stated that the patient is scheduled to be seen by PT on 09/15/18 and they would need to know his address because which PT sees him depends on the zip code where he is living.  Wells Guiles also stated that they have an order for MSW eval but that has not been scheduled yet. Also provided Wells Guiles with his correct phone # [phone]  Call placed to the patient and informed him that PT is scheduled to see him 09/15/18 but they will need to know his address. Provided him with the phone # for [facility] and he said that he would call them tomorrow with his address.

## 2018-09-13 NOTE — Telephone Encounter (Signed)
Message received from patient requesting a call back.  Call returned to patient.  He said that the probation officer extended his stay at [facility] through 11/12/18 and then his friend, Mr Ace Gins, paid for him to stay tonight. He said that he plans to stay with another friend until he can move into [facility] which may be a few month away.  He said that he still needs to get the glucometer from the house where he had been staying prior to his hospitalization.   He said that he has not called [facility] to schedule an assessment but will call today.  He said that he has phone numbers to call about peer support in the community but has not called anyone.  When asked if he would like this CM to assist with a referral he said that he had another call coming in and was not sure who was trying to call him.  He tried to check the other line multiple times but on one was there. He then was focused on who was trying to call him.  He said that he would call this CM back tomorrow.

## 2018-09-15 ENCOUNTER — Telehealth: Payer: Self-pay

## 2018-09-15 NOTE — Telephone Encounter (Signed)
Call received from patient.  He said that he is staying at [facility] again. He said that he knows what he needs to do regarding avoiding temptations of cocaine.   He said that he left a message for Melissa at [facility] regarding when he would be able to move into an apartment there.  He is still waiting for a call back.   He said the he has an appointment with [facility] but needs to call back to confirm the date / time. He also noted that he has an appointment with cardiology 09/17/18.  He called social services to schedule transportation to that appointment but was told that he needs to come to their office to re-certify and they cant help him until after 09/20/18.  He said that he will need to try to get a ride to the cardiology appointment.

## 2018-09-17 ENCOUNTER — Ambulatory Visit: Payer: Medicaid Other | Admitting: Cardiology

## 2018-09-21 ENCOUNTER — Encounter: Payer: Self-pay | Admitting: *Deleted

## 2018-09-27 ENCOUNTER — Telehealth: Payer: Self-pay

## 2018-09-27 NOTE — Telephone Encounter (Signed)
Call received from the patient requesting another copy of his ID.  A copy was placed in an envelope at the front desk.  He said that he will pick it up when he can find a ride to the clinic.  Reminded him of the importance of contacting [facility] to schedule his assessment and explained that if he is approved for [facility], he will be able to ride the regular city bus for free.  He also said that he is planning to move as the current housing situation is not optimal for his well-being.  He said that he is hoping to move into a home down the street from where he is currently living. He said that he will be able to rent a room from her for $250/month and split the other costs.  He then explained that he had to contact social security because his last SSI check was only $71. The rest was withheld for child support. He explained that child support can't be garnished from [facility] payment and he hopes to have the $700 returned. He also noted that he is owed 2 more back payments : $1690 and $2300.

## 2018-10-05 ENCOUNTER — Telehealth: Payer: Self-pay

## 2018-10-05 IMAGING — CR DG CHEST 2V
2 series · 2 of 2 positions shown · non-contrast
Comparison: None.

CLINICAL DATA: Status post fall from bed, with concern for chest
injury. Initial encounter.

EXAM:
CHEST  2 VIEW

[chest lat]
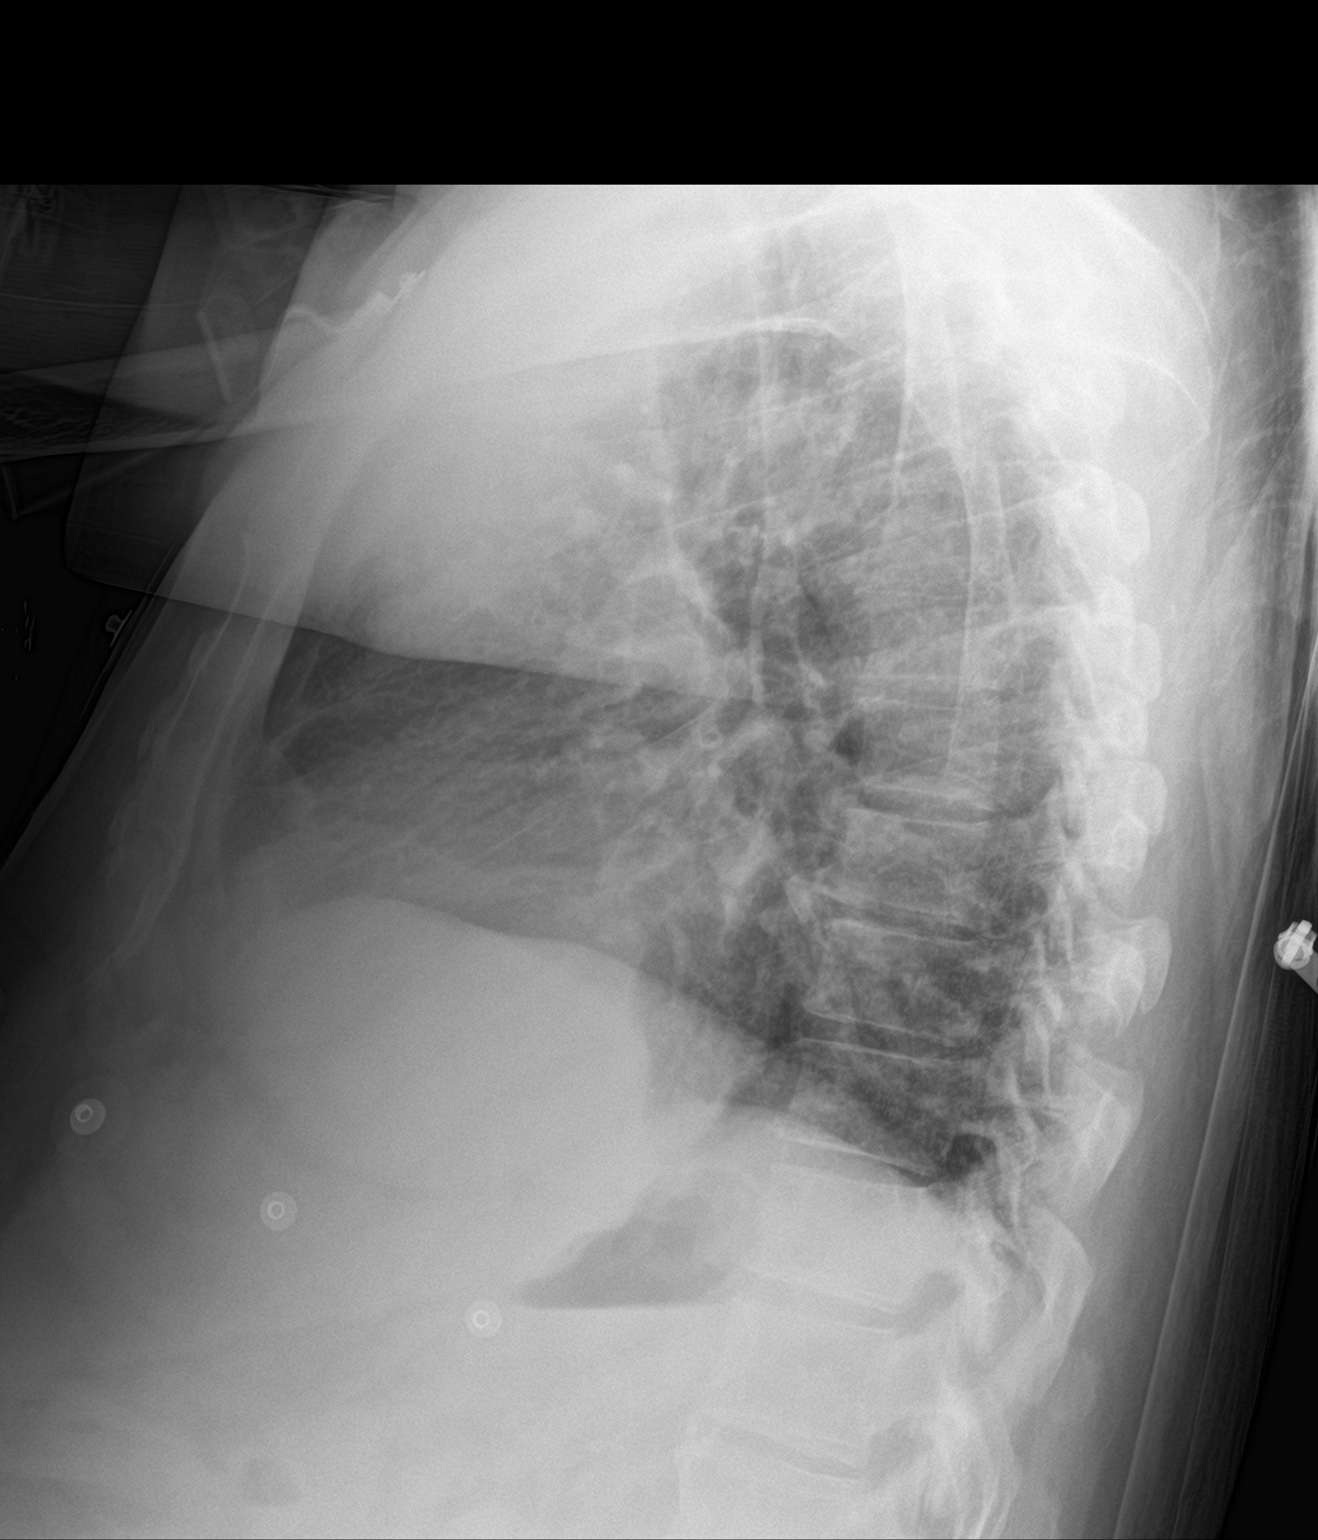

[chest ap]
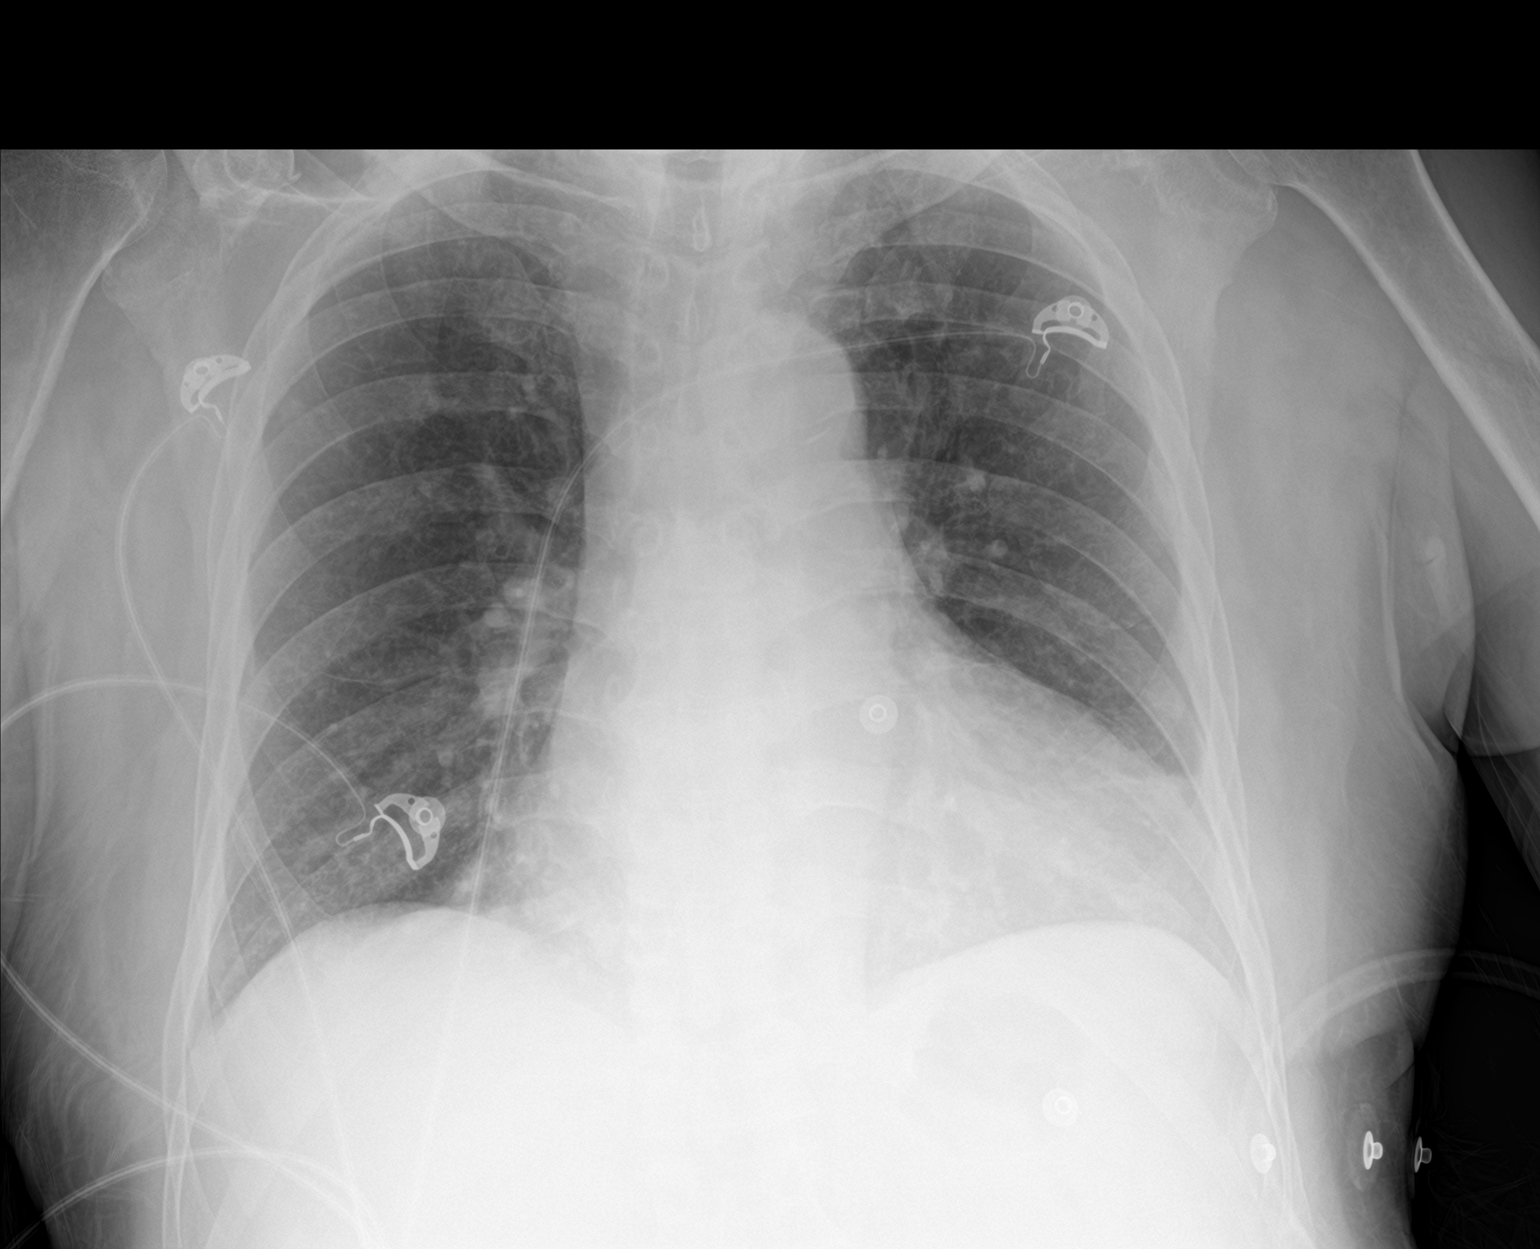

[2 of 2 positions shown; findings below may reference images not displayed]

FINDINGS: The lungs are well-aerated. Mild bibasilar atelectasis is noted.
There is no evidence of pleural effusion or pneumothorax.

The heart is mildly enlarged. No acute osseous abnormalities are
seen.
IMPRESSION: Mild bibasilar atelectasis noted. Lungs otherwise clear. Mild
cardiomegaly. No displaced rib fracture seen.

## 2018-10-05 IMAGING — DX DG LUMBAR SPINE COMPLETE 4+V
5 series · 5 of 5 positions shown · non-contrast
Comparison: None.

CLINICAL DATA: Initial evaluation for acute trauma, fall.

EXAM:
LUMBAR SPINE - COMPLETE 4+ VIEW

[l-spine ap]
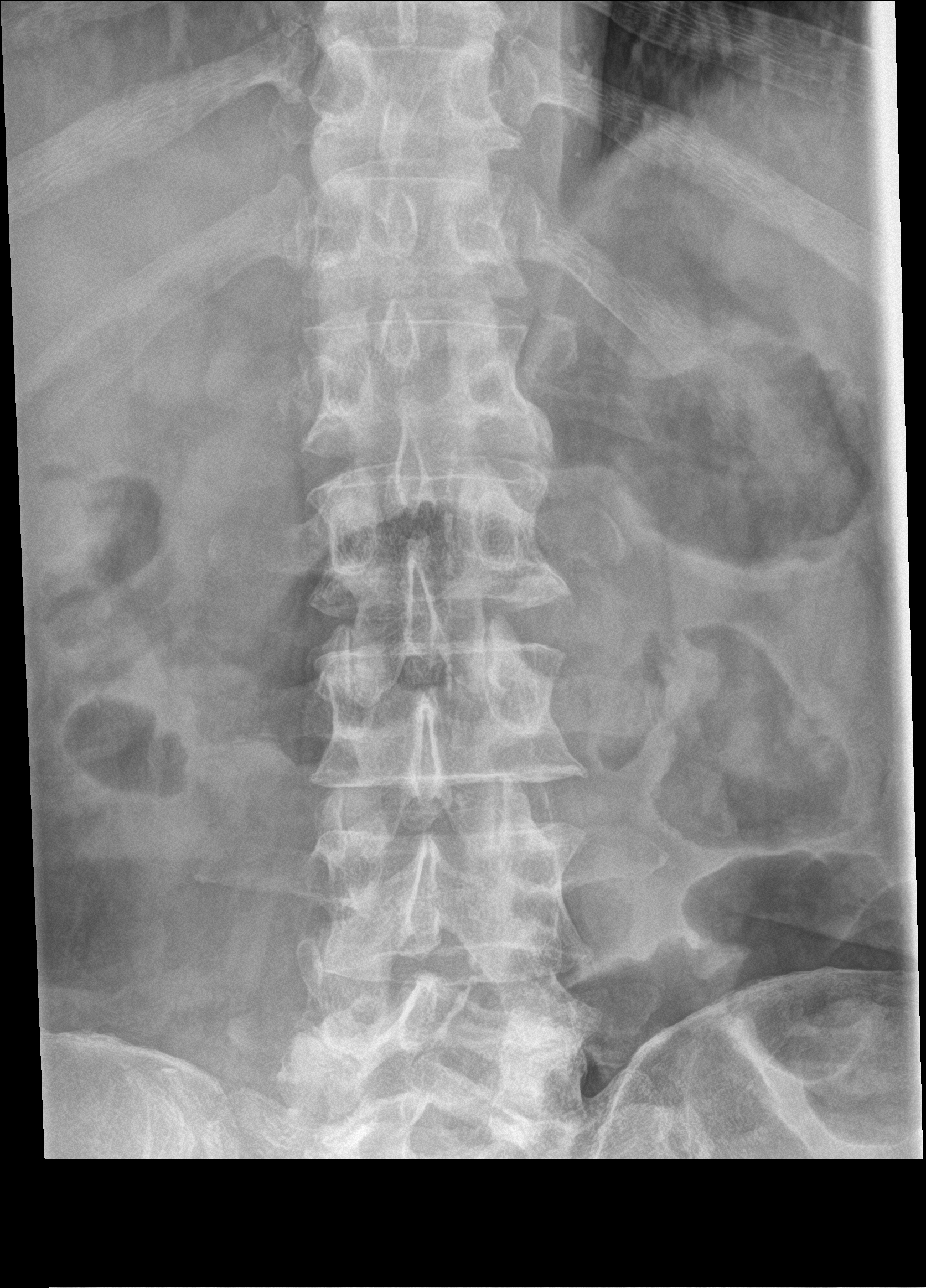

[l-spine obl (1 of 2)]
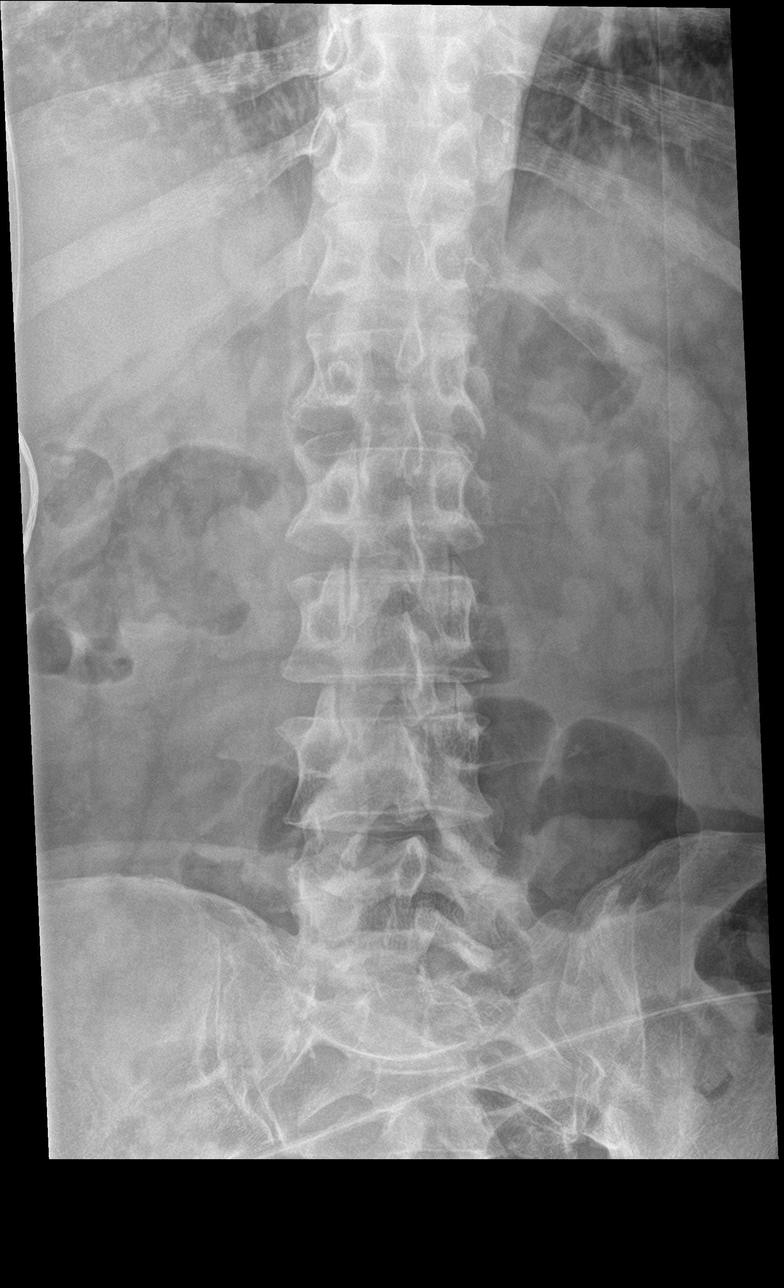

[l-spine obl (2 of 2)]
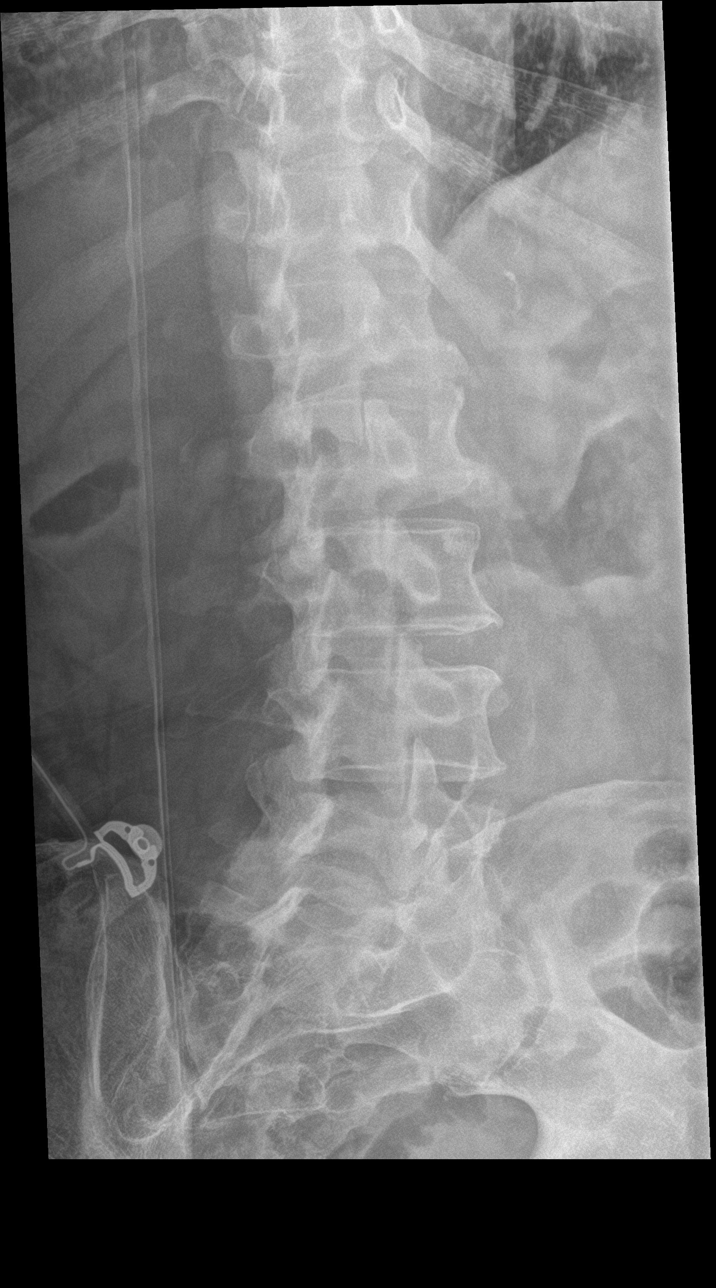

[l-spine lat]
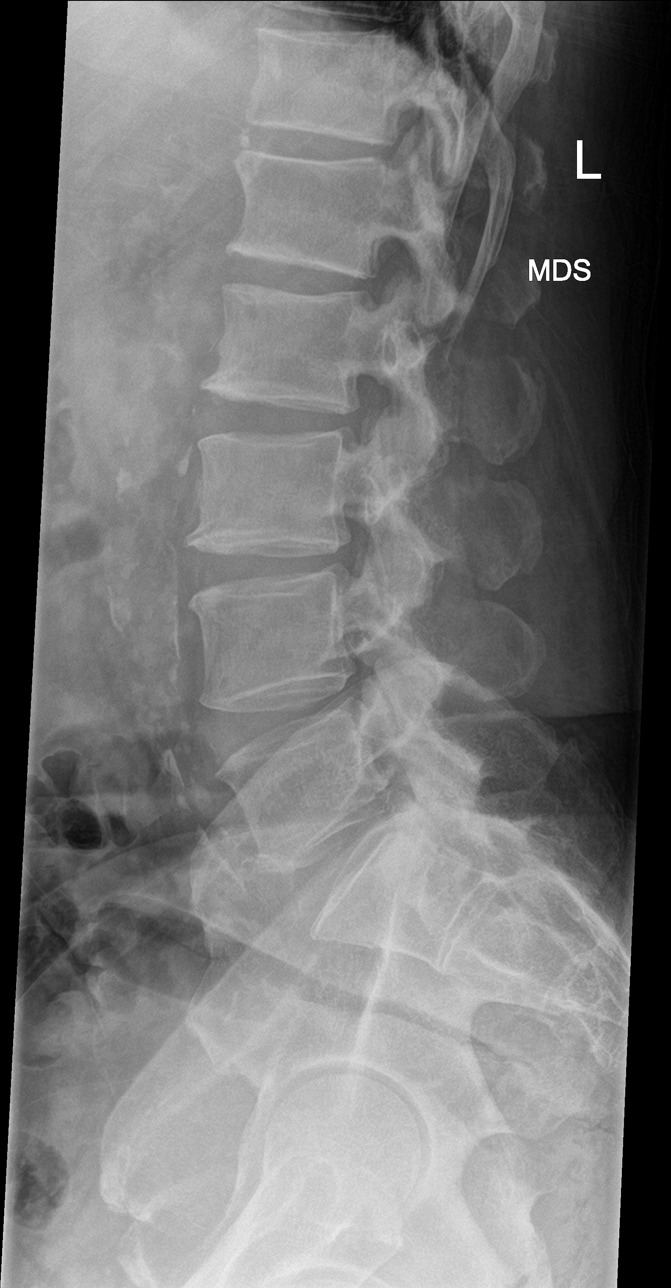

[l-spine spot]
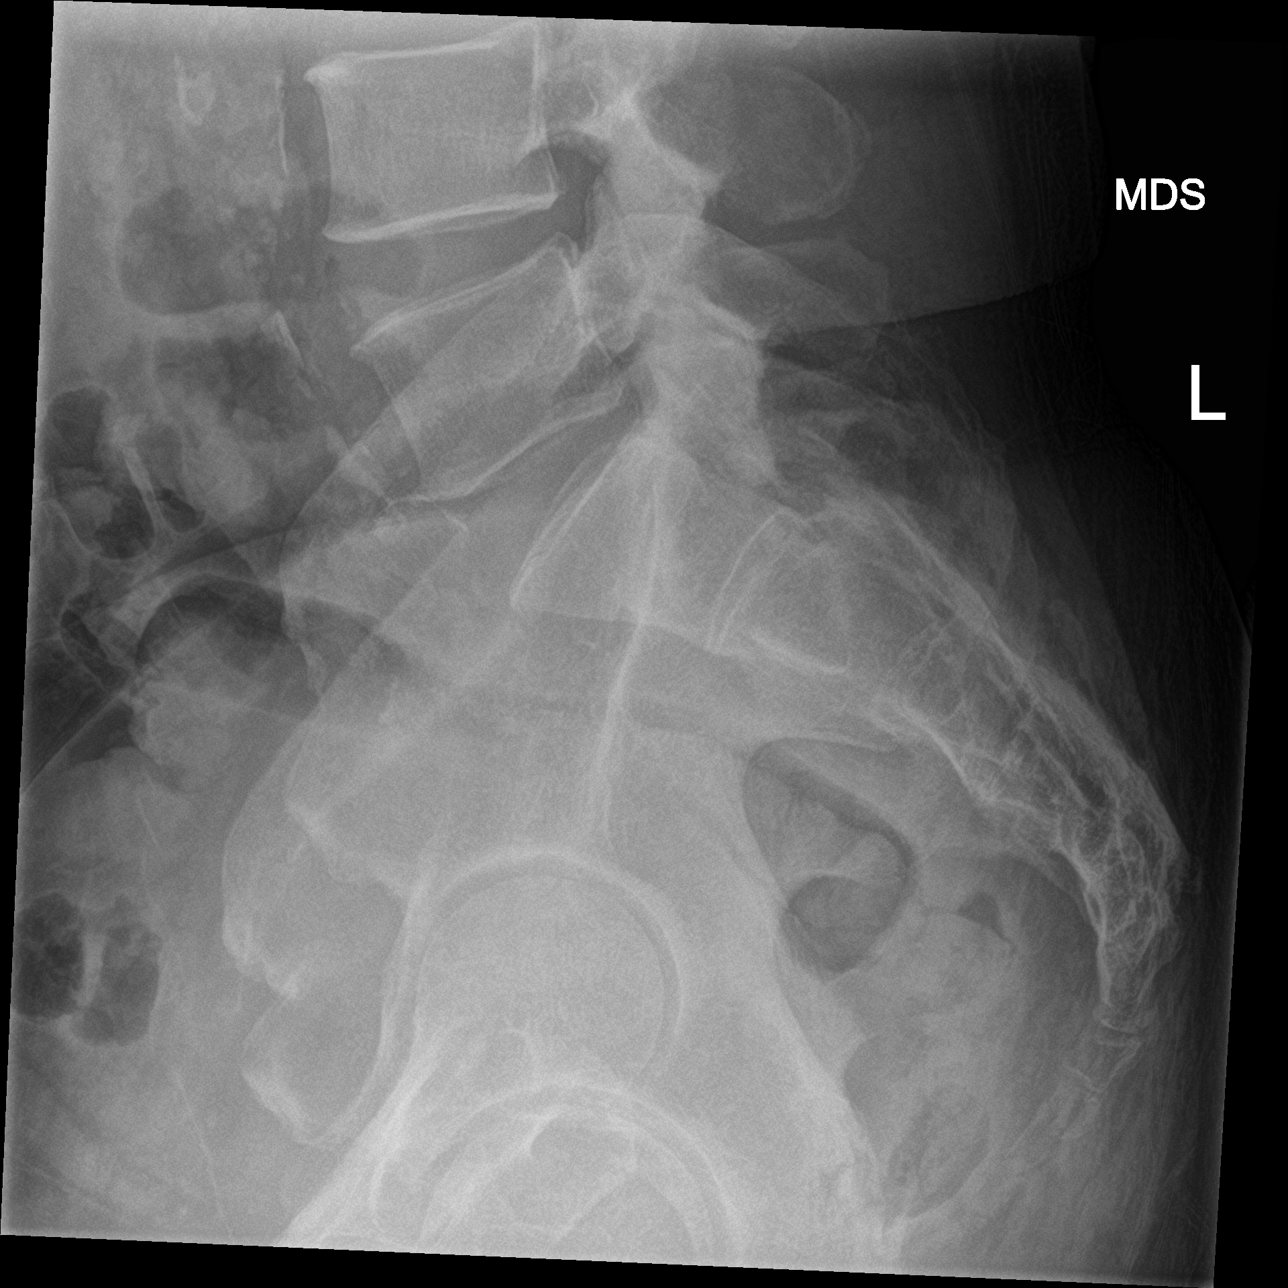

[5 of 5 positions shown; findings below may reference images not displayed]

FINDINGS: Vertebral bodies normally aligned with preservation of the normal
lumbar lordosis. No listhesis or malalignment. Vertebral body
heights maintained. No evidence for acute fracture. Visualized
sacrum intact.

Mild degenerative intervertebral disc space narrowing at L2-3 and
L3-4. Bilateral facet arthrosis present at L4-5.

Aortic atherosclerosis.
IMPRESSION: No radiographic evidence for acute traumatic injury within the
lumbar spine.

## 2018-10-05 IMAGING — DX DG THORACIC SPINE 2V
3 series · 3 of 3 positions shown · non-contrast
Comparison: None.

CLINICAL DATA: Initial evaluation for acute trauma, fall.

EXAM:
THORACIC SPINE 2 VIEWS

[t-spine ap]
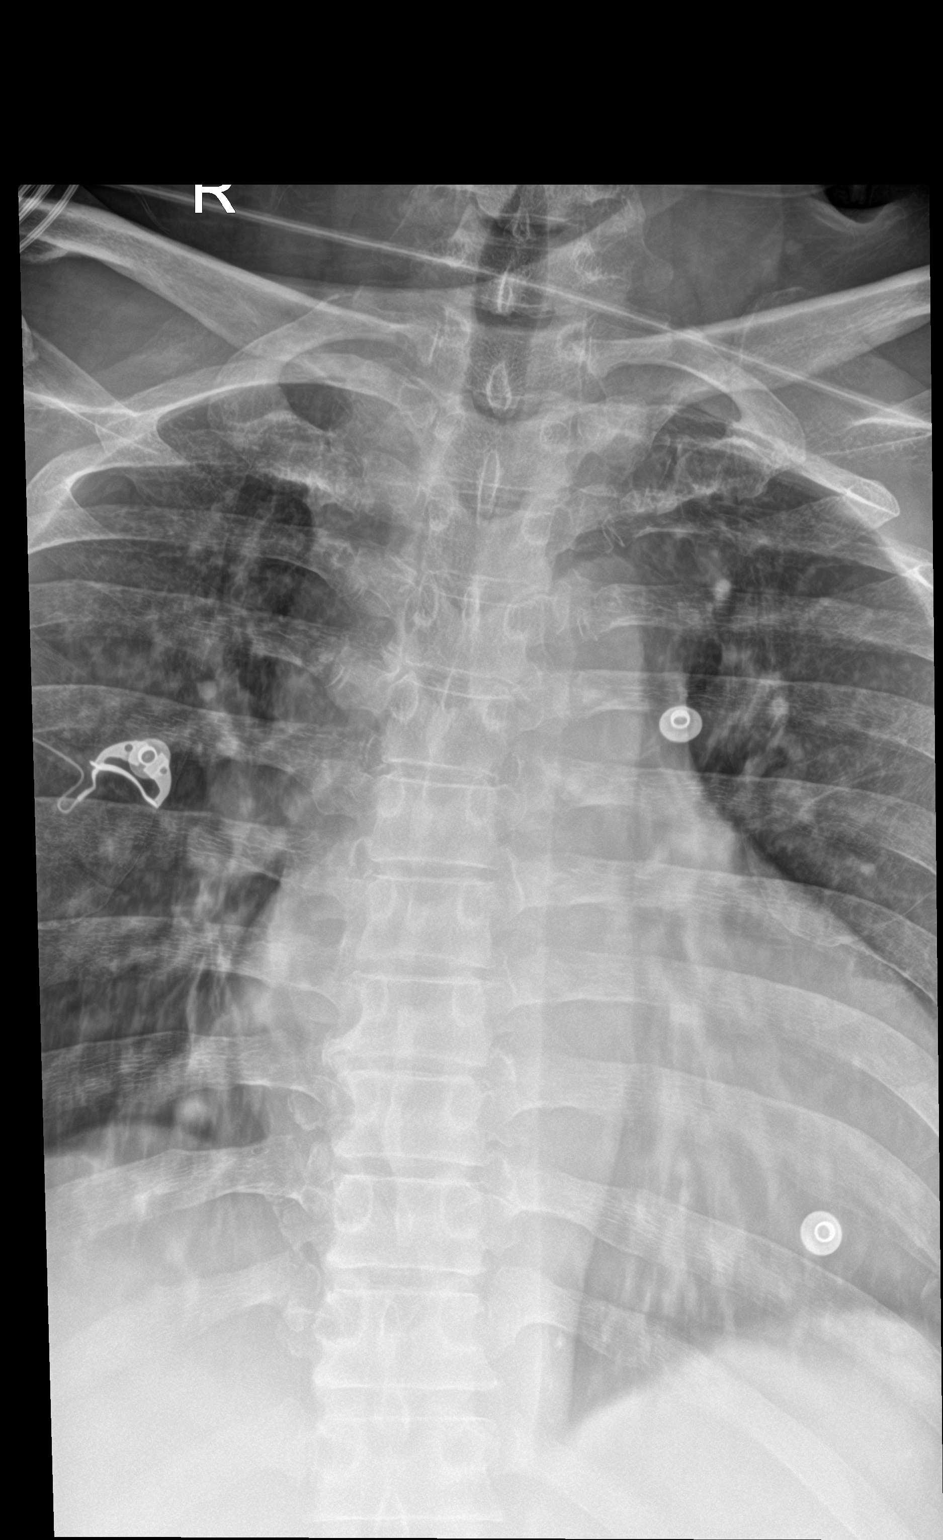

[t-spine lat]
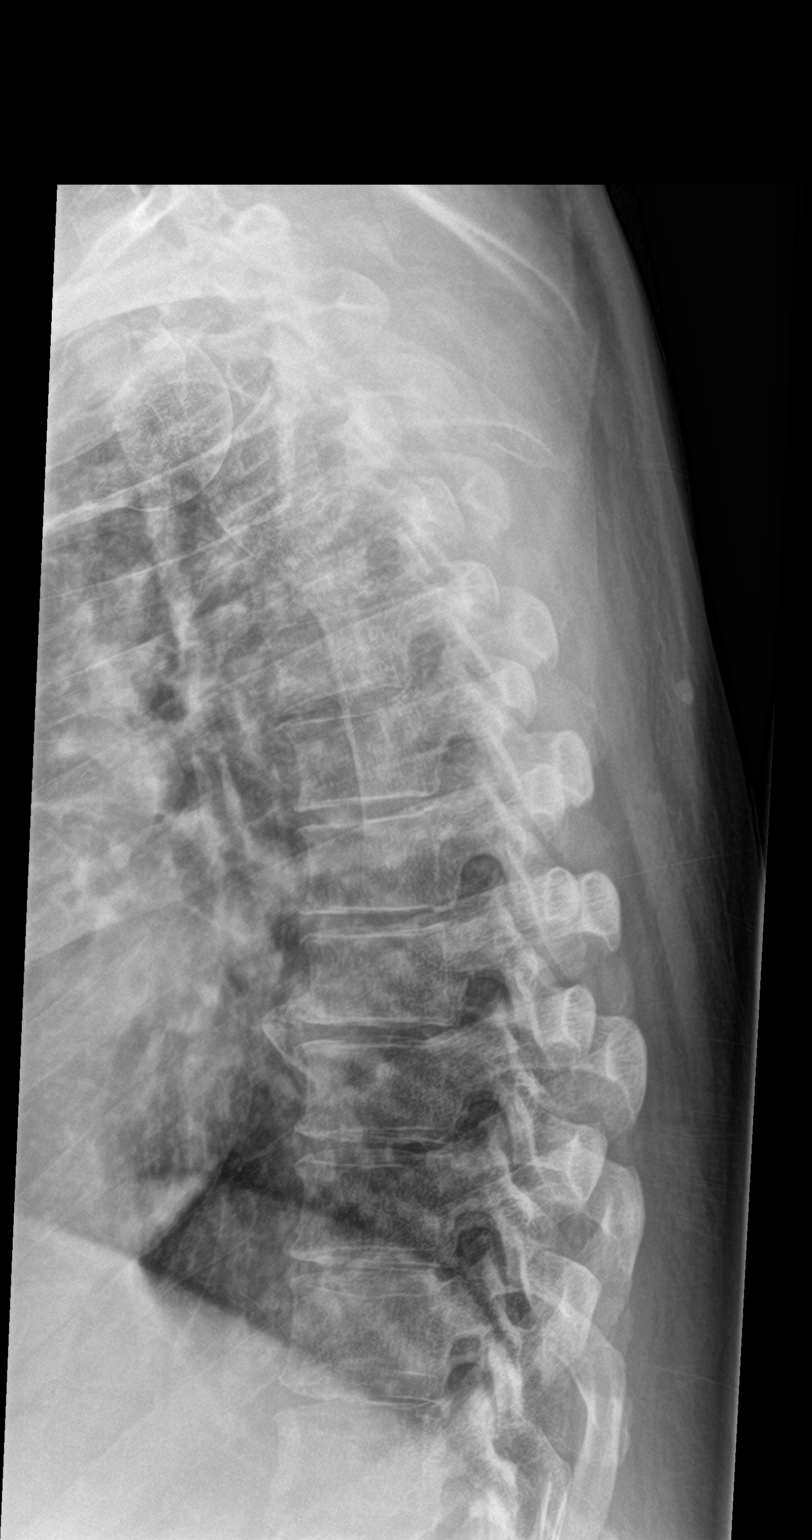

[t-spine swimmers]
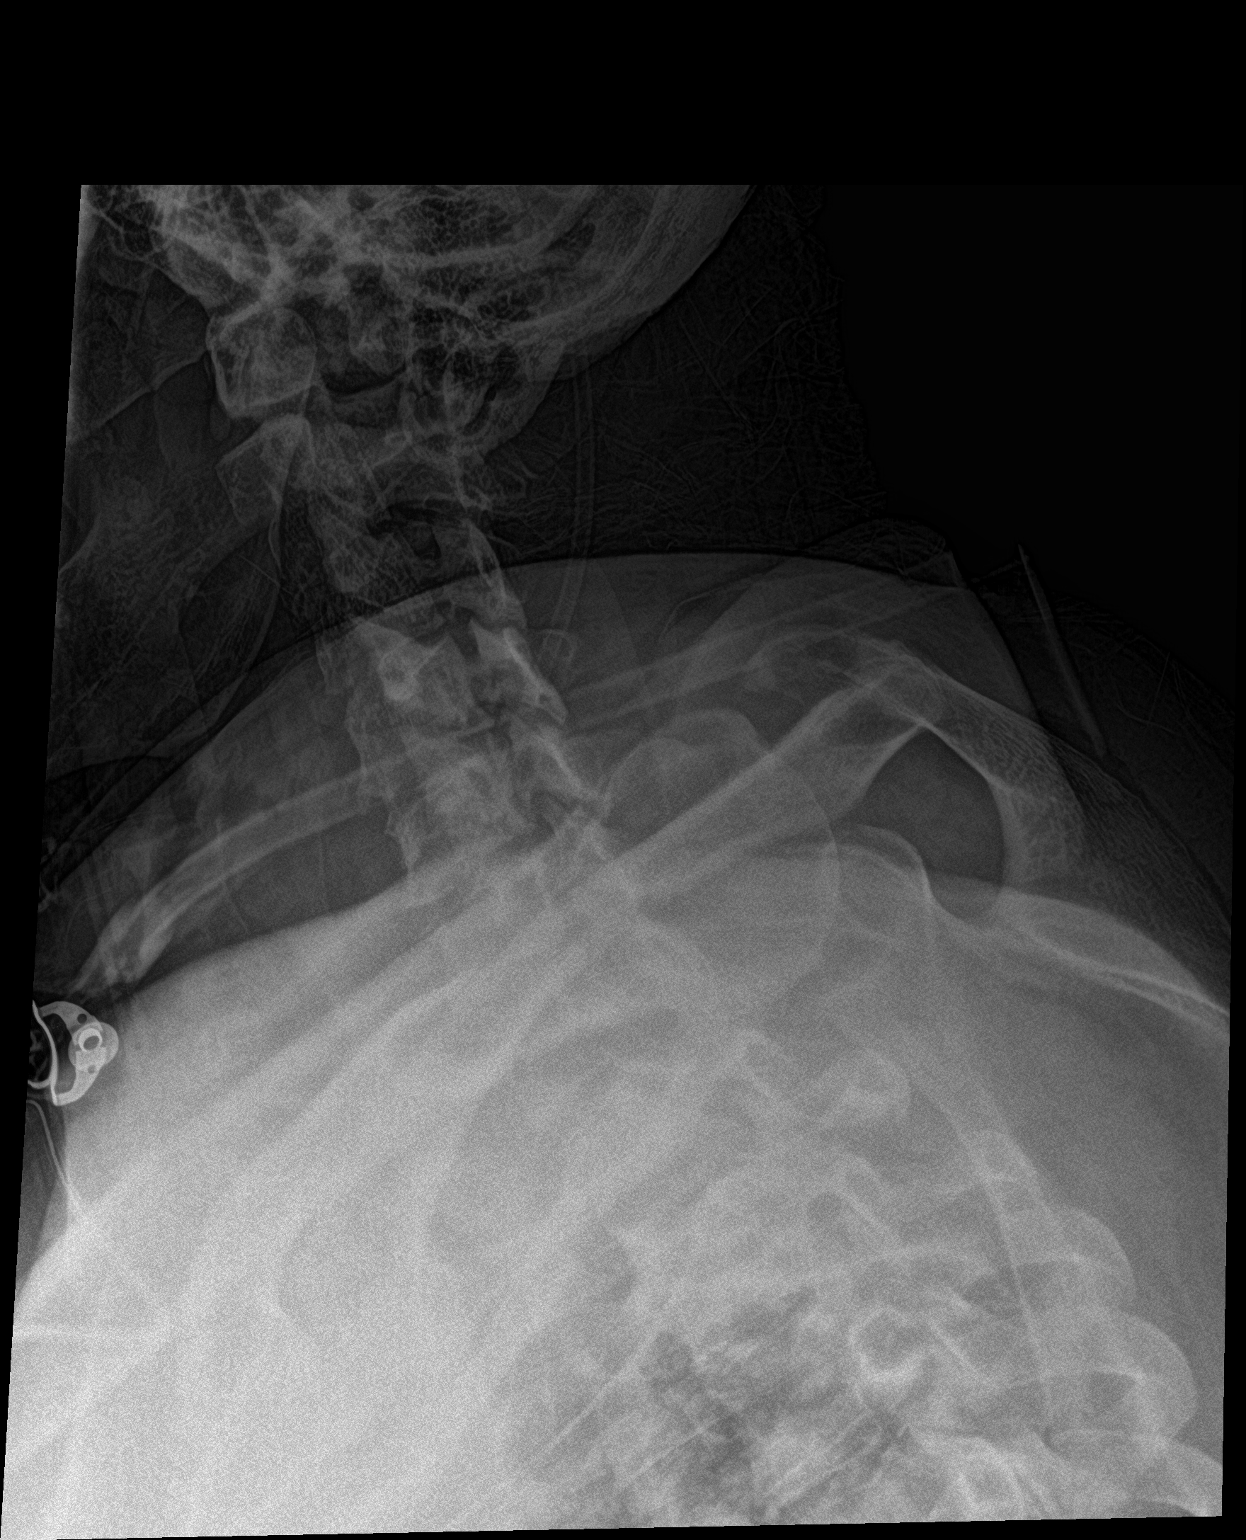

[3 of 3 positions shown; findings below may reference images not displayed]

FINDINGS: Mild smooth dextroscoliosis, like related positioning. Vertebral
bodies otherwise normally aligned with preservation of the normal
thoracic kyphosis. Vertebral body heights maintained. No acute
fracture. Visualized heart and lungs within normal limits.
IMPRESSION: No radiographic evidence for acute traumatic injury within the
thoracic spine.

## 2018-10-05 NOTE — Telephone Encounter (Signed)
Call received from the patient. He was inquiring if the copy of his ID is still at the front desk. Informed him that it is waiting for him to pick up. Reminded him that he has an appointment with Dr Margarita Rana on 10/12/18 @ [phone] and he can pick it up then if he is not able to get to the clinic before then. He said that he would have transportation to the clinic for the appointment,   He said that he has moved into a different home on the same street and he feels much better about where he is living. He said that the woman that he is staying with bought some food for him and can also provide transportation.   Regarding [facility], he said that he needs the phone # and agreed to have this CM text him the #.  The phone # was text at patient's request.   He said that he has not heard back from [facility] about housing. Informed him that this CM would call to check on the status of his application.  He also said that he is supposed to receive the money back from the child support that was taken from is disability check and he is still due back pay in 10/2018  Call placed to [facility], # [phone], message left for [facility] requesting a call back to this CM # [phone].   Call received from the patient, he said that he called [facility] and has an assessment scheduled for 10/15/18. This CM stressed the importance of keeping that appointment. If approved he would be able to also ride the city bus for free.  He said that he has a city bus stop right outside of his door.

## 2018-10-11 ENCOUNTER — Telehealth: Payer: Self-pay

## 2018-10-11 NOTE — Telephone Encounter (Signed)
Multiple calls received from patient. He said that he doesn't have transportation to the clinic tomorrow. Informed him that a cab could pick him up and to be ready by 0845.  His address was confirmed. Leigh , [place] office team lead notified of the need for a cab.  The patient also said that his probation officer came to see him today and brought him a listing of housing options. Instructed him to bring the listing with him to the clinic tomorrow and he can make some calls.  He explained that his phone will shut off tonight as he is not able to pay $35 to keep it on. He said that he does not have any money left from his disability check this month and will not be paid until after 10/22/18. He explained that he spoke to [place] at [facility] and the next orientation is not until 11/05/18 and there is still at least a 3 month wait for an apartment.

## 2018-10-12 ENCOUNTER — Ambulatory Visit: Payer: Medicaid Other | Attending: Family Medicine | Admitting: Family Medicine

## 2018-10-12 ENCOUNTER — Telehealth: Payer: Self-pay

## 2018-10-12 ENCOUNTER — Encounter: Payer: Self-pay | Admitting: Family Medicine

## 2018-10-12 VITALS — BP 146/76 | HR 107 | Temp 98.4°F | Ht 71.0 in | Wt 171.4 lb

## 2018-10-12 DIAGNOSIS — I10 Essential (primary) hypertension: Secondary | ICD-10-CM

## 2018-10-12 DIAGNOSIS — G8929 Other chronic pain: Secondary | ICD-10-CM | POA: Insufficient documentation

## 2018-10-12 DIAGNOSIS — I482 Chronic atrial fibrillation, unspecified: Secondary | ICD-10-CM | POA: Insufficient documentation

## 2018-10-12 DIAGNOSIS — K219 Gastro-esophageal reflux disease without esophagitis: Secondary | ICD-10-CM | POA: Diagnosis not present

## 2018-10-12 DIAGNOSIS — N183 Chronic kidney disease, stage 3 (moderate): Secondary | ICD-10-CM | POA: Insufficient documentation

## 2018-10-12 DIAGNOSIS — R634 Abnormal weight loss: Secondary | ICD-10-CM | POA: Insufficient documentation

## 2018-10-12 DIAGNOSIS — Z9889 Other specified postprocedural states: Secondary | ICD-10-CM | POA: Insufficient documentation

## 2018-10-12 DIAGNOSIS — E118 Type 2 diabetes mellitus with unspecified complications: Secondary | ICD-10-CM

## 2018-10-12 DIAGNOSIS — Z888 Allergy status to other drugs, medicaments and biological substances status: Secondary | ICD-10-CM | POA: Insufficient documentation

## 2018-10-12 DIAGNOSIS — F329 Major depressive disorder, single episode, unspecified: Secondary | ICD-10-CM | POA: Diagnosis not present

## 2018-10-12 DIAGNOSIS — M791 Myalgia, unspecified site: Secondary | ICD-10-CM | POA: Insufficient documentation

## 2018-10-12 DIAGNOSIS — M25571 Pain in right ankle and joints of right foot: Secondary | ICD-10-CM | POA: Diagnosis not present

## 2018-10-12 DIAGNOSIS — Z1211 Encounter for screening for malignant neoplasm of colon: Secondary | ICD-10-CM

## 2018-10-12 DIAGNOSIS — E1140 Type 2 diabetes mellitus with diabetic neuropathy, unspecified: Secondary | ICD-10-CM | POA: Diagnosis not present

## 2018-10-12 DIAGNOSIS — I4892 Unspecified atrial flutter: Secondary | ICD-10-CM | POA: Insufficient documentation

## 2018-10-12 DIAGNOSIS — E1122 Type 2 diabetes mellitus with diabetic chronic kidney disease: Secondary | ICD-10-CM | POA: Diagnosis present

## 2018-10-12 DIAGNOSIS — Z794 Long term (current) use of insulin: Secondary | ICD-10-CM | POA: Diagnosis not present

## 2018-10-12 DIAGNOSIS — Z79899 Other long term (current) drug therapy: Secondary | ICD-10-CM | POA: Insufficient documentation

## 2018-10-12 DIAGNOSIS — I5042 Chronic combined systolic (congestive) and diastolic (congestive) heart failure: Secondary | ICD-10-CM | POA: Insufficient documentation

## 2018-10-12 DIAGNOSIS — F1911 Other psychoactive substance abuse, in remission: Secondary | ICD-10-CM | POA: Insufficient documentation

## 2018-10-12 DIAGNOSIS — Z885 Allergy status to narcotic agent status: Secondary | ICD-10-CM | POA: Diagnosis not present

## 2018-10-12 DIAGNOSIS — I130 Hypertensive heart and chronic kidney disease with heart failure and stage 1 through stage 4 chronic kidney disease, or unspecified chronic kidney disease: Secondary | ICD-10-CM | POA: Diagnosis present

## 2018-10-12 DIAGNOSIS — I5022 Chronic systolic (congestive) heart failure: Secondary | ICD-10-CM

## 2018-10-12 DIAGNOSIS — R0981 Nasal congestion: Secondary | ICD-10-CM | POA: Diagnosis not present

## 2018-10-12 DIAGNOSIS — E7800 Pure hypercholesterolemia, unspecified: Secondary | ICD-10-CM | POA: Diagnosis not present

## 2018-10-12 LAB — GLUCOSE, POCT (MANUAL RESULT ENTRY): POC Glucose: 161 mg/dl — AB (ref 70–99)

## 2018-10-12 MED ORDER — CETIRIZINE HCL 10 MG PO TABS: 10.0000 mg | ORAL_TABLET | Freq: Every day | ORAL | 1 refills | Status: DC

## 2018-10-12 MED ORDER — AMITRIPTYLINE HCL 75 MG PO TABS: 75.0000 mg | ORAL_TABLET | Freq: Every day | ORAL | 6 refills | Status: DC

## 2018-10-12 MED ORDER — ATORVASTATIN CALCIUM 40 MG PO TABS: 40.0000 mg | ORAL_TABLET | Freq: Every day | ORAL | 6 refills | Status: DC
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
## 2018-10-12 NOTE — Progress Notes (Signed)
Patient is having pain over entire body.  Patient has also been coughing.

## 2018-10-12 NOTE — Telephone Encounter (Signed)
Met with the patient when he was in the clinic today.   Call placed to [facility], spoke to Valera who said that his assessment was 09/28/18 and he missed that  A new appointment was scheduled for 10/26/18 @ 1100. She was also provided his current address.  He would like to move into another apartment noting that his current living situation is not an optimal environment for him. With the patient present, calls placed to the following apartment complexes: [facility] # [phone] - voicemail message left with call back to this CM # [phone].' [facility]a [place] # [phone], spoke to [facility] who stated that there is a 6-12 month wait list. [facility] # [phone], not open until 1400 today [place] # [phone] - closed on [place] # [phone], spoke to Joe, unit currently available. $600/month + power and water. Application fee/ deposit requited . minnimum monthly income  - $1800. The patient does not qualify.  Calls then placed to [place] list of current openings on [employer] website: # [phone] - voicemail message left for Francee Piccolo  with this CM call back #  # [phone] - voicemail message left fr Cecille Aver with this CM call back #.   # [phone], spoke to Rob, no openings  # [phone], spoke to [facility] who stated that they have 2 openings. He requested that the patient call him tonight at 2020 for an interview. This patient was given this information in writing and said he would call. He did not want to call any other [place] houses or apartments at this time, he said that he just wants to call [employer].   After leaving [facility] he was going to [place] to pick up his new prescriptions. Call placed to [name], [facility] who stated that the medications ordered today would be ready for pick up this afternoon.  The patient spoke a in depth about his upbringing , family and struggles that he has faced including spending time in federal prison.  Emotional support provided.   After leaving [facility] and going to [employer] ,  he was going to DSS to re-certify for food stamps as well as to the [facility] to pick up his mail, the health department  to see a dentist and then to the phone store to pay a phone bill in order to have his phone turned back on. Bus passes were provided to him.   he was also

## 2018-10-13 ENCOUNTER — Telehealth: Payer: Self-pay

## 2018-10-13 ENCOUNTER — Encounter: Payer: Self-pay | Admitting: Family Medicine

## 2018-10-13 LAB — PSA, TOTAL AND FREE
PSA, Free Pct: 21 %
PSA, Free: 0.42 ng/mL
Prostate Specific Ag, Serum: 2 ng/mL (ref 0.0–4.0)

## 2018-10-13 LAB — TSH: TSH: 3.26 u[IU]/mL (ref 0.450–4.500)

## 2018-10-13 LAB — HIV ANTIBODY (ROUTINE TESTING W REFLEX): HIV Screen 4th Generation wRfx: NONREACTIVE

## 2018-10-13 NOTE — Telephone Encounter (Signed)
Please disregard last comment from entry yesterday. ' he was also" this is an error   Call received from the patient stating that he had his phone turned back on however he fell asleep and was not able to call the [facility] yesterday evening.  He said that he took the allergy medication and fell asleep.  Encouraged him to call the director this morning and explain honestly what happened.  Provided him with the contact # for [facility] # [phone].   Call received from patient. Said he spoke to [place] and explained what happened last night. He said that [place] spoke to him for awhile and then told him that he would speak to him later this evening for a phone interview.

## 2018-10-13 NOTE — Telephone Encounter (Signed)
Call received from the patient this afternoon wanting to confirm the phone # for Josh[facility].  Phone # confirmed and he said that he would call Josh by 1900 if he has not heard from him by then

## 2018-10-14 ENCOUNTER — Telehealth: Payer: Self-pay

## 2018-10-14 NOTE — Telephone Encounter (Signed)
Attempted to contact the patient to inquire if he was able to speak to the representative from [facility]. Call placed to # [phone] (M) and message left requesting a call back to this CM

## 2018-10-19 ENCOUNTER — Telehealth: Payer: Self-pay

## 2018-10-19 NOTE — Telephone Encounter (Signed)
-----   Message from Charlott Rakes, MD sent at 10/13/2018  1:14 PM EDT ----- HIV, prostate, thyroid labs are all negative

## 2018-10-19 NOTE — Telephone Encounter (Signed)
Patient was called and there is no voicemail set up to leave a message. 

## 2018-10-20 ENCOUNTER — Telehealth: Payer: Self-pay | Admitting: Family Medicine

## 2018-10-20 NOTE — Telephone Encounter (Signed)
Pt called to request a call back, please follow up

## 2018-10-21 ENCOUNTER — Encounter: Payer: Self-pay | Admitting: Gastroenterology

## 2018-10-21 NOTE — Telephone Encounter (Signed)
Call returned to the patient # [phone] and message left requesting a call back to this CM # [phone]. Call placed to # [phone] and the phone rang continuously, no option to leave a message

## 2018-10-21 NOTE — Telephone Encounter (Signed)
Call received from the patient, he explained that yesterday and today his urine has been " gold with red flecks."  He said the he has been drinking soda and water as he usually does.  He also said that he called the ED yesterday about this and they told him to call his PCP. Informed him that this CM would notify Dr Margarita Rana and call him back.  This CM also gave him the results of his recent labwork; HIV, TSH,PSA and reminded him that he needs to schedule a colonoscopy and provided him with the number to call for the appointment.  He said that the [employer] that he spoke to last week is no longer going to support the house and provided him with the contact information for another [place]. He said that he has a call tonight at [phone] with the director of that house.     He also said that he has an appointment with the pain clinic on 11/05/18.  This CM reminded him that he has an orientation at [facility] that day as well and he has a [facility] appointment on 10/26/18.   Update provided to Dr Margarita Rana about the patient's urine. She encouraged him to increase his water intake.  Call returned to the patient and informed him of Dr Newlin's recommendations to increase his water intake. He said that he would do that and call the clinic if he does not see any improvement.

## 2018-10-22 NOTE — Telephone Encounter (Signed)
Advised to increase water intake.

## 2018-10-25 ENCOUNTER — Telehealth: Payer: Self-pay

## 2018-10-25 NOTE — Telephone Encounter (Signed)
Left message with patient regarding need to schedule an OV with an extender before his colonoscopy. His echo results on 07/27/2018 indicate an EF of 20-25 %.  Cancelled PV and colon.

## 2018-10-25 NOTE — Telephone Encounter (Signed)
Attempted to contact patient # [phone] to remind him of his [facility] assessment tomorrow - 10/26/18 @ 1100. Message left requesting a call back to this CM # [phone]

## 2018-10-31 ENCOUNTER — Inpatient Hospital Stay (HOSPITAL_COMMUNITY)
Admission: EM | Admit: 2018-10-31 | Discharge: 2018-11-08 | DRG: 291 | Disposition: A | Discharge status: Home / Self Care | Payer: Medicaid Other | Attending: Internal Medicine | Admitting: Internal Medicine

## 2018-10-31 ENCOUNTER — Emergency Department (HOSPITAL_COMMUNITY): Payer: Medicaid Other

## 2018-10-31 ENCOUNTER — Encounter (HOSPITAL_COMMUNITY): Payer: Self-pay

## 2018-10-31 ENCOUNTER — Other Ambulatory Visit: Payer: Self-pay

## 2018-10-31 DIAGNOSIS — R296 Repeated falls: Secondary | ICD-10-CM | POA: Diagnosis present

## 2018-10-31 DIAGNOSIS — E119 Type 2 diabetes mellitus without complications: Secondary | ICD-10-CM | POA: Diagnosis not present

## 2018-10-31 DIAGNOSIS — I2510 Atherosclerotic heart disease of native coronary artery without angina pectoris: Secondary | ICD-10-CM | POA: Diagnosis present

## 2018-10-31 DIAGNOSIS — I2729 Other secondary pulmonary hypertension: Secondary | ICD-10-CM | POA: Diagnosis present

## 2018-10-31 DIAGNOSIS — T501X6A Underdosing of loop [high-ceiling] diuretics, initial encounter: Secondary | ICD-10-CM | POA: Diagnosis present

## 2018-10-31 DIAGNOSIS — I5043 Acute on chronic combined systolic (congestive) and diastolic (congestive) heart failure: Secondary | ICD-10-CM | POA: Diagnosis present

## 2018-10-31 DIAGNOSIS — IMO0002 Reserved for concepts with insufficient information to code with codable children: Secondary | ICD-10-CM

## 2018-10-31 DIAGNOSIS — I083 Combined rheumatic disorders of mitral, aortic and tricuspid valves: Secondary | ICD-10-CM | POA: Diagnosis present

## 2018-10-31 DIAGNOSIS — B182 Chronic viral hepatitis C: Secondary | ICD-10-CM | POA: Diagnosis present

## 2018-10-31 DIAGNOSIS — I5023 Acute on chronic systolic (congestive) heart failure: Secondary | ICD-10-CM | POA: Diagnosis not present

## 2018-10-31 DIAGNOSIS — F1410 Cocaine abuse, uncomplicated: Secondary | ICD-10-CM | POA: Diagnosis present

## 2018-10-31 DIAGNOSIS — Z9114 Patient's other noncompliance with medication regimen: Secondary | ICD-10-CM

## 2018-10-31 DIAGNOSIS — F1490 Cocaine use, unspecified, uncomplicated: Secondary | ICD-10-CM | POA: Diagnosis not present

## 2018-10-31 DIAGNOSIS — F329 Major depressive disorder, single episode, unspecified: Secondary | ICD-10-CM | POA: Diagnosis present

## 2018-10-31 DIAGNOSIS — Z79899 Other long term (current) drug therapy: Secondary | ICD-10-CM

## 2018-10-31 DIAGNOSIS — Z91128 Patient's intentional underdosing of medication regimen for other reason: Secondary | ICD-10-CM

## 2018-10-31 DIAGNOSIS — I472 Ventricular tachycardia: Secondary | ICD-10-CM | POA: Diagnosis present

## 2018-10-31 DIAGNOSIS — I340 Nonrheumatic mitral (valve) insufficiency: Secondary | ICD-10-CM | POA: Diagnosis not present

## 2018-10-31 DIAGNOSIS — Z9181 History of falling: Secondary | ICD-10-CM | POA: Diagnosis not present

## 2018-10-31 DIAGNOSIS — I4892 Unspecified atrial flutter: Secondary | ICD-10-CM | POA: Diagnosis present

## 2018-10-31 DIAGNOSIS — I4821 Permanent atrial fibrillation: Secondary | ICD-10-CM | POA: Diagnosis present

## 2018-10-31 DIAGNOSIS — E1140 Type 2 diabetes mellitus with diabetic neuropathy, unspecified: Secondary | ICD-10-CM

## 2018-10-31 DIAGNOSIS — R0602 Shortness of breath: Secondary | ICD-10-CM | POA: Diagnosis present

## 2018-10-31 DIAGNOSIS — E1122 Type 2 diabetes mellitus with diabetic chronic kidney disease: Secondary | ICD-10-CM | POA: Diagnosis present

## 2018-10-31 DIAGNOSIS — Z87828 Personal history of other (healed) physical injury and trauma: Secondary | ICD-10-CM

## 2018-10-31 DIAGNOSIS — Z7901 Long term (current) use of anticoagulants: Secondary | ICD-10-CM | POA: Diagnosis not present

## 2018-10-31 DIAGNOSIS — I071 Rheumatic tricuspid insufficiency: Secondary | ICD-10-CM | POA: Diagnosis not present

## 2018-10-31 DIAGNOSIS — I428 Other cardiomyopathies: Secondary | ICD-10-CM | POA: Diagnosis present

## 2018-10-31 DIAGNOSIS — B372 Candidiasis of skin and nail: Secondary | ICD-10-CM | POA: Diagnosis present

## 2018-10-31 DIAGNOSIS — L304 Erythema intertrigo: Secondary | ICD-10-CM | POA: Diagnosis not present

## 2018-10-31 DIAGNOSIS — Z794 Long term (current) use of insulin: Secondary | ICD-10-CM

## 2018-10-31 DIAGNOSIS — E1165 Type 2 diabetes mellitus with hyperglycemia: Secondary | ICD-10-CM

## 2018-10-31 DIAGNOSIS — Z8546 Personal history of malignant neoplasm of prostate: Secondary | ICD-10-CM

## 2018-10-31 DIAGNOSIS — K7460 Unspecified cirrhosis of liver: Secondary | ICD-10-CM | POA: Diagnosis present

## 2018-10-31 DIAGNOSIS — Z9861 Coronary angioplasty status: Secondary | ICD-10-CM

## 2018-10-31 DIAGNOSIS — R011 Cardiac murmur, unspecified: Secondary | ICD-10-CM | POA: Diagnosis not present

## 2018-10-31 DIAGNOSIS — F1190 Opioid use, unspecified, uncomplicated: Secondary | ICD-10-CM | POA: Diagnosis not present

## 2018-10-31 DIAGNOSIS — Z888 Allergy status to other drugs, medicaments and biological substances status: Secondary | ICD-10-CM | POA: Diagnosis not present

## 2018-10-31 DIAGNOSIS — I272 Pulmonary hypertension, unspecified: Secondary | ICD-10-CM | POA: Diagnosis not present

## 2018-10-31 DIAGNOSIS — I440 Atrioventricular block, first degree: Secondary | ICD-10-CM | POA: Diagnosis not present

## 2018-10-31 DIAGNOSIS — I480 Paroxysmal atrial fibrillation: Secondary | ICD-10-CM

## 2018-10-31 DIAGNOSIS — I5082 Biventricular heart failure: Secondary | ICD-10-CM | POA: Diagnosis present

## 2018-10-31 DIAGNOSIS — I130 Hypertensive heart and chronic kidney disease with heart failure and stage 1 through stage 4 chronic kidney disease, or unspecified chronic kidney disease: Principal | ICD-10-CM | POA: Diagnosis present

## 2018-10-31 DIAGNOSIS — N183 Chronic kidney disease, stage 3 unspecified: Secondary | ICD-10-CM | POA: Diagnosis present

## 2018-10-31 DIAGNOSIS — K219 Gastro-esophageal reflux disease without esophagitis: Secondary | ICD-10-CM | POA: Diagnosis present

## 2018-10-31 DIAGNOSIS — E118 Type 2 diabetes mellitus with unspecified complications: Secondary | ICD-10-CM

## 2018-10-31 DIAGNOSIS — I081 Rheumatic disorders of both mitral and tricuspid valves: Secondary | ICD-10-CM

## 2018-10-31 DIAGNOSIS — I110 Hypertensive heart disease with heart failure: Secondary | ICD-10-CM | POA: Diagnosis not present

## 2018-10-31 DIAGNOSIS — I509 Heart failure, unspecified: Secondary | ICD-10-CM

## 2018-10-31 DIAGNOSIS — Z791 Long term (current) use of non-steroidal anti-inflammatories (NSAID): Secondary | ICD-10-CM

## 2018-10-31 DIAGNOSIS — F1010 Alcohol abuse, uncomplicated: Secondary | ICD-10-CM | POA: Diagnosis present

## 2018-10-31 DIAGNOSIS — I50810 Right heart failure, unspecified: Secondary | ICD-10-CM | POA: Diagnosis not present

## 2018-10-31 DIAGNOSIS — N179 Acute kidney failure, unspecified: Secondary | ICD-10-CM | POA: Diagnosis not present

## 2018-10-31 DIAGNOSIS — Z803 Family history of malignant neoplasm of breast: Secondary | ICD-10-CM

## 2018-10-31 DIAGNOSIS — I252 Old myocardial infarction: Secondary | ICD-10-CM

## 2018-10-31 DIAGNOSIS — E7800 Pure hypercholesterolemia, unspecified: Secondary | ICD-10-CM | POA: Diagnosis present

## 2018-10-31 DIAGNOSIS — E1121 Type 2 diabetes mellitus with diabetic nephropathy: Secondary | ICD-10-CM

## 2018-10-31 DIAGNOSIS — Z8041 Family history of malignant neoplasm of ovary: Secondary | ICD-10-CM

## 2018-10-31 DIAGNOSIS — B379 Candidiasis, unspecified: Secondary | ICD-10-CM | POA: Diagnosis not present

## 2018-10-31 DIAGNOSIS — Z96652 Presence of left artificial knee joint: Secondary | ICD-10-CM | POA: Diagnosis present

## 2018-10-31 DIAGNOSIS — I483 Typical atrial flutter: Secondary | ICD-10-CM | POA: Diagnosis not present

## 2018-10-31 DIAGNOSIS — I471 Supraventricular tachycardia: Secondary | ICD-10-CM

## 2018-10-31 DIAGNOSIS — E871 Hypo-osmolality and hyponatremia: Secondary | ICD-10-CM | POA: Diagnosis present

## 2018-10-31 DIAGNOSIS — R21 Rash and other nonspecific skin eruption: Secondary | ICD-10-CM | POA: Diagnosis not present

## 2018-10-31 DIAGNOSIS — W19XXXA Unspecified fall, initial encounter: Secondary | ICD-10-CM | POA: Diagnosis not present

## 2018-10-31 DIAGNOSIS — I361 Nonrheumatic tricuspid (valve) insufficiency: Secondary | ICD-10-CM | POA: Diagnosis not present

## 2018-10-31 DIAGNOSIS — I351 Nonrheumatic aortic (valve) insufficiency: Secondary | ICD-10-CM | POA: Diagnosis not present

## 2018-10-31 DIAGNOSIS — I4729 Other ventricular tachycardia: Secondary | ICD-10-CM

## 2018-10-31 DIAGNOSIS — Z8249 Family history of ischemic heart disease and other diseases of the circulatory system: Secondary | ICD-10-CM

## 2018-10-31 DIAGNOSIS — F17210 Nicotine dependence, cigarettes, uncomplicated: Secondary | ICD-10-CM | POA: Diagnosis present

## 2018-10-31 LAB — CBC WITH DIFFERENTIAL/PLATELET
Abs Immature Granulocytes: 0.01 10*3/uL (ref 0.00–0.07)
Basophils Absolute: 0 10*3/uL (ref 0.0–0.1)
Basophils Relative: 1 %
Eosinophils Absolute: 0 10*3/uL (ref 0.0–0.5)
Eosinophils Relative: 1 %
HCT: 32.9 % — ABNORMAL LOW (ref 39.0–52.0)
Hemoglobin: 11.2 g/dL — ABNORMAL LOW (ref 13.0–17.0)
Immature Granulocytes: 0 %
Lymphocytes Relative: 20 %
Lymphs Abs: 0.9 10*3/uL (ref 0.7–4.0)
MCH: 30.2 pg (ref 26.0–34.0)
MCHC: 34 g/dL (ref 30.0–36.0)
MCV: 88.7 fL (ref 80.0–100.0)
Monocytes Absolute: 0.6 10*3/uL (ref 0.1–1.0)
Monocytes Relative: 13 %
Neutro Abs: 2.8 10*3/uL (ref 1.7–7.7)
Neutrophils Relative %: 65 %
Platelets: 156 10*3/uL (ref 150–400)
RBC: 3.71 MIL/uL — ABNORMAL LOW (ref 4.22–5.81)
RDW: 13.2 % (ref 11.5–15.5)
WBC: 4.3 10*3/uL (ref 4.0–10.5)
nRBC: 0 % (ref 0.0–0.2)

## 2018-10-31 LAB — COMPREHENSIVE METABOLIC PANEL
ALT: 17 U/L (ref 0–44)
AST: 26 U/L (ref 15–41)
Albumin: 3.4 g/dL — ABNORMAL LOW (ref 3.5–5.0)
Alkaline Phosphatase: 50 U/L (ref 38–126)
Anion gap: 13 (ref 5–15)
BUN: 13 mg/dL (ref 6–20)
CO2: 19 mmol/L — ABNORMAL LOW (ref 22–32)
Calcium: 9.1 mg/dL (ref 8.9–10.3)
Chloride: 85 mmol/L — ABNORMAL LOW (ref 98–111)
Creatinine, Ser: 1.61 mg/dL — ABNORMAL HIGH (ref 0.61–1.24)
GFR calc Af Amer: 53 mL/min — ABNORMAL LOW (ref >60)
GFR calc non Af Amer: 46 mL/min — ABNORMAL LOW (ref >60)
Glucose, Bld: 144 mg/dL — ABNORMAL HIGH (ref 70–99)
Potassium: 4.7 mmol/L (ref 3.5–5.1)
Sodium: 117 mmol/L — CL (ref 135–145)
Total Bilirubin: 1.8 mg/dL — ABNORMAL HIGH (ref 0.3–1.2)
Total Protein: 8.3 g/dL — ABNORMAL HIGH (ref 6.5–8.1)

## 2018-10-31 LAB — TROPONIN I
Troponin I: 0.05 ng/mL (ref <0.03)
Troponin I: 0.05 ng/mL (ref <0.03)

## 2018-10-31 LAB — MAGNESIUM: Magnesium: 1.8 mg/dL (ref 1.7–2.4)

## 2018-10-31 LAB — GLUCOSE, CAPILLARY
Glucose-Capillary: 153 mg/dL — ABNORMAL HIGH (ref 70–99)
Glucose-Capillary: 132 mg/dL — ABNORMAL HIGH (ref 70–99)

## 2018-10-31 LAB — BASIC METABOLIC PANEL
Anion gap: 10 (ref 5–15)
BUN: 12 mg/dL (ref 6–20)
CO2: 18 mmol/L — ABNORMAL LOW (ref 22–32)
Calcium: 8.5 mg/dL — ABNORMAL LOW (ref 8.9–10.3)
Chloride: 84 mmol/L — ABNORMAL LOW (ref 98–111)
Creatinine, Ser: 1.52 mg/dL — ABNORMAL HIGH (ref 0.61–1.24)
GFR calc Af Amer: 57 mL/min — ABNORMAL LOW (ref >60)
GFR calc non Af Amer: 49 mL/min — ABNORMAL LOW (ref >60)
Glucose, Bld: 162 mg/dL — ABNORMAL HIGH (ref 70–99)
Potassium: 4.8 mmol/L (ref 3.5–5.1)
Sodium: 112 mmol/L — CL (ref 135–145)

## 2018-10-31 LAB — OSMOLALITY: Osmolality: 255 mOsm/kg — ABNORMAL LOW (ref 275–295)

## 2018-10-31 LAB — BRAIN NATRIURETIC PEPTIDE: B Natriuretic Peptide: 1446.3 pg/mL — ABNORMAL HIGH (ref 0.0–100.0)

## 2018-10-31 LAB — I-STAT TROPONIN, ED: Troponin i, poc: 0.05 ng/mL (ref 0.00–0.08)

## 2018-10-31 MED ORDER — ASPIRIN EC 81 MG PO TBEC
81.0000 mg | DELAYED_RELEASE_TABLET | Freq: Every day | ORAL | Status: DC
  Administered 2018-10-31 – 2018-11-02 (×3): 81 mg via ORAL
  Filled 2018-10-31 (×3): qty 1

## 2018-10-31 MED ORDER — ONDANSETRON HCL 4 MG PO TABS: 4.0000 mg | ORAL_TABLET | Freq: Four times a day (QID) | ORAL | Status: DC | PRN

## 2018-10-31 MED ORDER — FUROSEMIDE 10 MG/ML IJ SOLN
60.0000 mg | Freq: Once | INTRAMUSCULAR | Status: AC
  Administered 2018-10-31: 60 mg via INTRAVENOUS
  Filled 2018-10-31: qty 6

## 2018-10-31 MED ORDER — AMITRIPTYLINE HCL 50 MG PO TABS
75.0000 mg | ORAL_TABLET | Freq: Every day | ORAL | Status: DC
  Administered 2018-10-31 – 2018-11-07 (×8): 75 mg via ORAL
  Filled 2018-10-31 (×8): qty 1

## 2018-10-31 MED ORDER — INSULIN ASPART 100 UNIT/ML ~~LOC~~ SOLN
0.0000 [IU] | Freq: Every day | SUBCUTANEOUS | Status: DC
  Administered 2018-11-03 – 2018-11-07 (×2): 2 [IU] via SUBCUTANEOUS

## 2018-10-31 MED ORDER — INSULIN ASPART 100 UNIT/ML ~~LOC~~ SOLN
0.0000 [IU] | Freq: Three times a day (TID) | SUBCUTANEOUS | Status: DC
  Administered 2018-10-31: 2 [IU] via SUBCUTANEOUS
  Administered 2018-11-01: 1 [IU] via SUBCUTANEOUS
  Administered 2018-11-01 (×2): 2 [IU] via SUBCUTANEOUS
  Administered 2018-11-02: 3 [IU] via SUBCUTANEOUS
  Administered 2018-11-03: 2 [IU] via SUBCUTANEOUS
  Administered 2018-11-03 – 2018-11-04 (×2): 3 [IU] via SUBCUTANEOUS
  Administered 2018-11-05: 2 [IU] via SUBCUTANEOUS
  Administered 2018-11-05: 5 [IU] via SUBCUTANEOUS
  Administered 2018-11-06: 1 [IU] via SUBCUTANEOUS
  Administered 2018-11-06: 2 [IU] via SUBCUTANEOUS
  Administered 2018-11-06: 1 [IU] via SUBCUTANEOUS
  Administered 2018-11-07 (×2): 2 [IU] via SUBCUTANEOUS
  Administered 2018-11-07: 5 [IU] via SUBCUTANEOUS
  Administered 2018-11-08: 3 [IU] via SUBCUTANEOUS
  Administered 2018-11-08: 1 [IU] via SUBCUTANEOUS

## 2018-10-31 MED ORDER — ACETAMINOPHEN 650 MG RE SUPP: 650.0000 mg | Freq: Four times a day (QID) | RECTAL | Status: DC | PRN

## 2018-10-31 MED ORDER — POLYETHYLENE GLYCOL 3350 17 G PO PACK: 17.0000 g | PACK | Freq: Every day | ORAL | Status: DC | PRN

## 2018-10-31 MED ORDER — ATORVASTATIN CALCIUM 40 MG PO TABS
40.0000 mg | ORAL_TABLET | Freq: Every day | ORAL | Status: DC
  Administered 2018-10-31 – 2018-11-08 (×9): 40 mg via ORAL
  Filled 2018-10-31 (×9): qty 1

## 2018-10-31 MED ORDER — ACETAMINOPHEN 325 MG PO TABS
650.0000 mg | ORAL_TABLET | Freq: Four times a day (QID) | ORAL | Status: DC | PRN
  Administered 2018-10-31: 650 mg via ORAL
  Filled 2018-10-31: qty 2

## 2018-10-31 MED ORDER — ONDANSETRON HCL 4 MG/2ML IJ SOLN: 4.0000 mg | Freq: Four times a day (QID) | INTRAMUSCULAR | Status: DC | PRN

## 2018-10-31 MED ORDER — AMLODIPINE BESYLATE 10 MG PO TABS
10.0000 mg | ORAL_TABLET | Freq: Every day | ORAL | Status: DC
  Administered 2018-10-31 – 2018-11-02 (×3): 10 mg via ORAL
  Filled 2018-10-31 (×3): qty 1

## 2018-10-31 MED ORDER — ENOXAPARIN SODIUM 40 MG/0.4ML ~~LOC~~ SOLN
40.0000 mg | SUBCUTANEOUS | Status: DC
  Administered 2018-10-31 – 2018-11-01 (×2): 40 mg via SUBCUTANEOUS
  Filled 2018-10-31 (×2): qty 0.4

## 2018-10-31 NOTE — Progress Notes (Signed)
Patient continues to have frequent beats of VTACH. MD paged X2. MD stated that they would be up to see patient. Rapid Response RN and RT at bedside. Will continue to monitor.

## 2018-10-31 NOTE — Progress Notes (Signed)
MD at bedside to assess patient. MD stated that the patient was stable to stay on this unit. No further orders. Will continue to monitor patient.
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
## 2018-10-31 NOTE — ED Notes (Signed)
MD aware of critical sodium

## 2018-10-31 NOTE — Progress Notes (Signed)
New Admission Note:   Arrival Method: Stretcher Mental Orientation: A&O X4 Telemetry: Initiated Assessment: Completed Skin: See Flowsheets IV: WDL Pain:10/10 Safety Measures: Safety Fall Prevention Plan has been given, discussed and signed Admission: Completed Unit Orientation: Patient has been orientated to the room, unit and staff.   Orders have been reviewed and implemented. Will continue to monitor the patient. Call light has been placed within reach and bed alarm has been activated.    Nazier Neyhart BSN, RN  
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
## 2018-10-31 NOTE — ED Triage Notes (Signed)
Pt arrives to ED from home with complaints of chest pain and SOB since last night. EMS reports pt has CHF, has been out of his medicine for a "long time". VSS, 12-lead unremarkable. Pt placed in position of comfort with bed locked and lowered, call bell in reach.

## 2018-11-01 DIAGNOSIS — W19XXXA Unspecified fall, initial encounter: Secondary | ICD-10-CM | POA: Diagnosis not present

## 2018-11-01 DIAGNOSIS — F1010 Alcohol abuse, uncomplicated: Secondary | ICD-10-CM

## 2018-11-01 LAB — BASIC METABOLIC PANEL
Anion gap: 13 (ref 5–15)
Anion gap: 12 (ref 5–15)
Anion gap: 9 (ref 5–15)
Anion gap: 10 (ref 5–15)
Anion gap: 7 (ref 5–15)
BUN: 17 mg/dL (ref 6–20)
BUN: 20 mg/dL (ref 6–20)
BUN: 18 mg/dL (ref 6–20)
BUN: 18 mg/dL (ref 6–20)
BUN: 21 mg/dL — ABNORMAL HIGH (ref 6–20)
CO2: 18 mmol/L — ABNORMAL LOW (ref 22–32)
CO2: 18 mmol/L — ABNORMAL LOW (ref 22–32)
CO2: 21 mmol/L — ABNORMAL LOW (ref 22–32)
CO2: 19 mmol/L — ABNORMAL LOW (ref 22–32)
CO2: 21 mmol/L — ABNORMAL LOW (ref 22–32)
Calcium: 8.9 mg/dL (ref 8.9–10.3)
Calcium: 8.8 mg/dL — ABNORMAL LOW (ref 8.9–10.3)
Calcium: 8.6 mg/dL — ABNORMAL LOW (ref 8.9–10.3)
Calcium: 8.4 mg/dL — ABNORMAL LOW (ref 8.9–10.3)
Calcium: 8.7 mg/dL — ABNORMAL LOW (ref 8.9–10.3)
Chloride: 86 mmol/L — ABNORMAL LOW (ref 98–111)
Chloride: 86 mmol/L — ABNORMAL LOW (ref 98–111)
Chloride: 85 mmol/L — ABNORMAL LOW (ref 98–111)
Chloride: 86 mmol/L — ABNORMAL LOW (ref 98–111)
Chloride: 89 mmol/L — ABNORMAL LOW (ref 98–111)
Creatinine, Ser: 1.92 mg/dL — ABNORMAL HIGH (ref 0.61–1.24)
Creatinine, Ser: 2.07 mg/dL — ABNORMAL HIGH (ref 0.61–1.24)
Creatinine, Ser: 2.1 mg/dL — ABNORMAL HIGH (ref 0.61–1.24)
Creatinine, Ser: 2.05 mg/dL — ABNORMAL HIGH (ref 0.61–1.24)
Creatinine, Ser: 2.32 mg/dL — ABNORMAL HIGH (ref 0.61–1.24)
GFR calc Af Amer: 43 mL/min — ABNORMAL LOW (ref >60)
GFR calc Af Amer: 39 mL/min — ABNORMAL LOW (ref >60)
GFR calc Af Amer: 38 mL/min — ABNORMAL LOW (ref >60)
GFR calc Af Amer: 39 mL/min — ABNORMAL LOW (ref >60)
GFR calc Af Amer: 34 mL/min — ABNORMAL LOW (ref >60)
GFR calc non Af Amer: 37 mL/min — ABNORMAL LOW (ref >60)
GFR calc non Af Amer: 34 mL/min — ABNORMAL LOW (ref >60)
GFR calc non Af Amer: 33 mL/min — ABNORMAL LOW (ref >60)
GFR calc non Af Amer: 34 mL/min — ABNORMAL LOW (ref >60)
GFR calc non Af Amer: 29 mL/min — ABNORMAL LOW (ref >60)
Glucose, Bld: 136 mg/dL — ABNORMAL HIGH (ref 70–99)
Glucose, Bld: 141 mg/dL — ABNORMAL HIGH (ref 70–99)
Glucose, Bld: 173 mg/dL — ABNORMAL HIGH (ref 70–99)
Glucose, Bld: 203 mg/dL — ABNORMAL HIGH (ref 70–99)
Glucose, Bld: 144 mg/dL — ABNORMAL HIGH (ref 70–99)
Potassium: 4.8 mmol/L (ref 3.5–5.1)
Potassium: 5.1 mmol/L (ref 3.5–5.1)
Potassium: 4.4 mmol/L (ref 3.5–5.1)
Potassium: 4.3 mmol/L (ref 3.5–5.1)
Potassium: 4.2 mmol/L (ref 3.5–5.1)
Sodium: 117 mmol/L — CL (ref 135–145)
Sodium: 116 mmol/L — CL (ref 135–145)
Sodium: 115 mmol/L — CL (ref 135–145)
Sodium: 115 mmol/L — CL (ref 135–145)
Sodium: 117 mmol/L — CL (ref 135–145)

## 2018-11-01 LAB — URINALYSIS, ROUTINE W REFLEX MICROSCOPIC
Bilirubin Urine: NEGATIVE
Glucose, UA: NEGATIVE mg/dL
Hgb urine dipstick: NEGATIVE
Ketones, ur: NEGATIVE mg/dL
Leukocytes, UA: NEGATIVE
Nitrite: NEGATIVE
Protein, ur: 100 mg/dL — AB
Specific Gravity, Urine: 1.008 (ref 1.005–1.030)
pH: 5 (ref 5.0–8.0)

## 2018-11-01 LAB — CBC
HCT: 33.1 % — ABNORMAL LOW (ref 39.0–52.0)
Hemoglobin: 11.3 g/dL — ABNORMAL LOW (ref 13.0–17.0)
MCH: 30.1 pg (ref 26.0–34.0)
MCHC: 34.1 g/dL (ref 30.0–36.0)
MCV: 88 fL (ref 80.0–100.0)
Platelets: 149 10*3/uL — ABNORMAL LOW (ref 150–400)
RBC: 3.76 MIL/uL — ABNORMAL LOW (ref 4.22–5.81)
RDW: 13.2 % (ref 11.5–15.5)
WBC: 5.8 10*3/uL (ref 4.0–10.5)
nRBC: 0 % (ref 0.0–0.2)

## 2018-11-01 LAB — MAGNESIUM: Magnesium: 1.9 mg/dL (ref 1.7–2.4)

## 2018-11-01 LAB — RAPID URINE DRUG SCREEN, HOSP PERFORMED
Amphetamines: NOT DETECTED
Barbiturates: NOT DETECTED
Benzodiazepines: NOT DETECTED
Cocaine: POSITIVE — AB
Opiates: NOT DETECTED
Tetrahydrocannabinol: NOT DETECTED

## 2018-11-01 LAB — TROPONIN I: Troponin I: 0.04 ng/mL (ref <0.03)

## 2018-11-01 LAB — GLUCOSE, CAPILLARY
Glucose-Capillary: 167 mg/dL — ABNORMAL HIGH (ref 70–99)
Glucose-Capillary: 164 mg/dL — ABNORMAL HIGH (ref 70–99)
Glucose-Capillary: 149 mg/dL — ABNORMAL HIGH (ref 70–99)
Glucose-Capillary: 139 mg/dL — ABNORMAL HIGH (ref 70–99)

## 2018-11-01 MED ORDER — FUROSEMIDE 10 MG/ML IJ SOLN
120.0000 mg | Freq: Once | INTRAVENOUS | Status: AC
  Administered 2018-11-01: 120 mg via INTRAVENOUS
  Filled 2018-11-01: qty 10

## 2018-11-01 MED ORDER — FUROSEMIDE 10 MG/ML IJ SOLN
80.0000 mg | Freq: Once | INTRAMUSCULAR | Status: AC
  Administered 2018-11-01: 80 mg via INTRAVENOUS
  Filled 2018-11-01: qty 8

## 2018-11-01 NOTE — Progress Notes (Signed)
Na is 115. Paged MD at this time.  Eleanora Neighbor, RN

## 2018-11-01 NOTE — Progress Notes (Signed)
Rounding MD's approached this nurse,informed me that patient has had a fall last night.I was surprised,we're not aware and no incident/report of him that he had fallen.Went into patient room ,and patient told me that he had fallen in the bathroom,by slipping down the floor and he hit his head on the wall around 2 a.m. Asked him ,if he did tell it to anyone about his fall incident to any staffs,and patient said ,not until the doctors were here few minutes ago.Patient still wearing non sliding socks as of this time.Discussed fall prevention education and informed patient that we are going to set bed alarm for him.R.N Mde awrare to the staffs about his fall incident last night.

## 2018-11-01 NOTE — Hospital Discharge Follow-Up (Signed)
Met with the patient this afternoon. He is well known to [facility].  An appointment has been scheduled for him with the [facility] 11/16/18 @ 1350 and the information was placed on the AVS.  He stated that he now has his own apartment in [facility] He said that he needs to get furniture but is not interested in the [employer]. He explained that he was able to secure this apartment because he spoke to the manager after receiving his back pay check and putting down a large deposit.   He also noted that he has a new phone number but the phone is at home and he could not provide this CM with the new number. He said that his would call with the number when he gets home.   He said that he ran out of his medications. Reminded him that he had refills for the medications and [facility] delivers. If in the future he thinks he is running out of medications he needs to call [facility].  [facility] will also work with him if he is not able to afford any co-pays.   Reminded him that he missed his [facility] appointment and he will need to reschedule.  He also has the option of using medicaid transportation to his medical appointments.

## 2018-11-01 NOTE — Progress Notes (Signed)
Initial Nutrition Assessment  DOCUMENTATION CODES:   Not applicable  INTERVENTION:   Pt verbalizes understanding of sodium and fluid restriction; continue to review/reinforce   NUTRITION DIAGNOSIS:   Food and nutrition related knowledge deficit related to chronic illness as evidenced by per patient/family report.  GOAL:   Patient will meet greater than or equal to 90% of their needs  MONITOR:   PO intake, Labs, Weight trends  REASON FOR ASSESSMENT:   Malnutrition Screening Tool    ASSESSMENT:    57 yo male admitted with with acute on chronic CHF due to NICM (EF 20-25%). PMH includes EtOH abuse, cocaine and heroin abuse, medication noncompliance, DM, CKD III, cirrhosis, hepatitis, CM  Recorded po intake 100% at breakfast; pt reports good appetite at present. Pt reports poor appetite starting on Wednesday due to SOB. Improved now  Hyponatremic; pt current on sodium and free water restriction  Pt reports UBW around 170 pounds; weight up to 181 pounds on admission; likely related to fluid status. No weight today.   Labs: sodium 119 (L), Creatinine 2.23, BUN wdl Meds: lasix, ss novolog  Diet Order:   Diet Order            Diet NPO time specified  Diet effective midnight        Diet Heart Room service appropriate? Yes; Fluid consistency: Thin; Fluid restriction: 1500 mL Fluid  Diet effective now              EDUCATION NEEDS:   Education needs have been addressed  Skin:  Skin Assessment: Reviewed RN Assessment  Last BM:  11/10  Height:   Ht Readings from Last 1 Encounters:  10/31/18 5\' 11"  (1.803 m)    Weight:   Wt Readings from Last 1 Encounters:  11/01/18 82.1 kg    Ideal Body Weight:     BMI:  Body mass index is 25.24 kg/m.  Estimated Nutritional Needs:   Kcal:  2000-2200 kcals   Protein:  98-110 g   Fluid:  1500 mL per MD   Kerman Passey MS, RD, LDN, CNSC [phone] Pager  [phone] Weekend/On-Call Pager

## 2018-11-01 NOTE — Progress Notes (Signed)
   Subjective: Brad [name] was seen and evaluated at bedside on morning rounds. He did not sleep well due to orthopnea. Also states he fell last night on his way back from the bathroom. Instructed him to use assistance from now on. No headache, lethargy or confusion. Explained the plan to continue trying to get fluid off with diuresis. All questions and concerns were addressed.   Objective:  Vital signs in last 24 hours: Vitals:   10/31/18 2022 11/01/18 0456 11/01/18 0859 11/01/18 1610  BP: 128/89  128/84 113/78  Pulse: (!) 110  (!) 105 (!) 109  Resp: 18  20 18   Temp: 98.2 F (36.8 C)  98.2 F (36.8 C) 98.4 F (36.9 C)  TempSrc: Oral  Oral Oral  SpO2: 100%  (!) 60% 98%  Weight: 82.1 kg 82.1 kg    Height:       General: awake, alert, sitting up in bed in NAD Neck: +JVD CV: tachycardic; SEM heard over mitral area  Pulm: normal respiratory effort; lungs CTA bilaterally  Abd: BS+; abdomen is soft, non-distended Ext: 1+ pitting edema BLE   Assessment/Plan:  Principal Problem:   Acute on chronic combined systolic and diastolic heart failure (HCC) Active Problems:   Type 2 diabetes mellitus with complication, without long-term current use of insulin (HCC)   CKD (chronic kidney disease) stage 3, GFR 30-59 ml/min (HCC)   Cocaine abuse (HCC)   NSVT (nonsustained ventricular tachycardia) ([place])   Hyponatremia   Fall  1. Acute on chronic HFrEF - due to medication non-adherence and cocaine use - volume up on exam - minimal urine output thus far; sodium has remained fairly stable; he showed minimal response to lasix 80 mg this morning so will increase to 120 for afternoon dose - will monitor daily BMPs and mag - daily weights; strict I&Os - will hold beta blocker for now and consider adding back in the next day or 2 if he improves clinically - repeat echo; will consult heart failure team given his complicated clinical status of concurrent right heart failure, pulmonary htn.   2.  Hyponatremia  - patient has been chronically hyponatremic with recent HFrEF admissions; however, this is the lowest it has been  - given lack of neurologic signs or symptoms, it is likely this is chronic  - most likely hypervolumemic hypernatremia; should improve with diuresis as above  - will monitor carefully to not overcorrect by more than 6-8 mEq in 24 hours   3. Paroxysmal afib/flutter - telemetry reveals non-sustained runs of VT with sinus tach  - he is hemodynamically stable and rate controlled without medications - holding beta-blocker given acute heart failure exacerbation   4. HTN - home regimen includes Amlodipine, Clonidine, Losartan, and Coreg - holding Coreg in setting of above - blood pressure stable on home Amlodipine   5. DM - sensitive sliding scale with meals and qhs  6. Substance abuse (EtOH and cocaine) - last endorses using cocaine on Monday - UDS positive for cocaine - on CIWA  7. Fall - reports that he fell overnight while using the bathroom - no headache, confusion or neuro findings on exam that indicate need for imaging - informed nursing stuff; fall precautions in place   Dispo: Anticipated discharge in approximately 4-5 day(s).   [place]ena Nunnery D, DO 11/01/2018, 4:39 PM Pager: [phone]
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
## 2018-11-02 ENCOUNTER — Other Ambulatory Visit (HOSPITAL_COMMUNITY): Payer: Medicaid Other

## 2018-11-02 DIAGNOSIS — I5043 Acute on chronic combined systolic (congestive) and diastolic (congestive) heart failure: Secondary | ICD-10-CM

## 2018-11-02 DIAGNOSIS — I483 Typical atrial flutter: Secondary | ICD-10-CM

## 2018-11-02 DIAGNOSIS — F1410 Cocaine abuse, uncomplicated: Secondary | ICD-10-CM

## 2018-11-02 DIAGNOSIS — I4892 Unspecified atrial flutter: Secondary | ICD-10-CM

## 2018-11-02 DIAGNOSIS — I428 Other cardiomyopathies: Secondary | ICD-10-CM

## 2018-11-02 LAB — BASIC METABOLIC PANEL
Anion gap: 8 (ref 5–15)
Anion gap: 9 (ref 5–15)
BUN: 20 mg/dL (ref 6–20)
BUN: 20 mg/dL (ref 6–20)
CO2: 24 mmol/L (ref 22–32)
CO2: 22 mmol/L (ref 22–32)
Calcium: 9 mg/dL (ref 8.9–10.3)
Calcium: 8.8 mg/dL — ABNORMAL LOW (ref 8.9–10.3)
Chloride: 87 mmol/L — ABNORMAL LOW (ref 98–111)
Chloride: 88 mmol/L — ABNORMAL LOW (ref 98–111)
Creatinine, Ser: 2.29 mg/dL — ABNORMAL HIGH (ref 0.61–1.24)
Creatinine, Ser: 2.23 mg/dL — ABNORMAL HIGH (ref 0.61–1.24)
GFR calc Af Amer: 34 mL/min — ABNORMAL LOW (ref >60)
GFR calc Af Amer: 36 mL/min — ABNORMAL LOW (ref >60)
GFR calc non Af Amer: 30 mL/min — ABNORMAL LOW (ref >60)
GFR calc non Af Amer: 31 mL/min — ABNORMAL LOW (ref >60)
Glucose, Bld: 166 mg/dL — ABNORMAL HIGH (ref 70–99)
Glucose, Bld: 144 mg/dL — ABNORMAL HIGH (ref 70–99)
Potassium: 4.5 mmol/L (ref 3.5–5.1)
Potassium: 4.1 mmol/L (ref 3.5–5.1)
Sodium: 119 mmol/L — CL (ref 135–145)
Sodium: 119 mmol/L — CL (ref 135–145)

## 2018-11-02 LAB — MAGNESIUM: Magnesium: 1.8 mg/dL (ref 1.7–2.4)

## 2018-11-02 LAB — GLUCOSE, CAPILLARY
Glucose-Capillary: 137 mg/dL — ABNORMAL HIGH (ref 70–99)
Glucose-Capillary: 234 mg/dL — ABNORMAL HIGH (ref 70–99)
Glucose-Capillary: 111 mg/dL — ABNORMAL HIGH (ref 70–99)
Glucose-Capillary: 162 mg/dL — ABNORMAL HIGH (ref 70–99)

## 2018-11-02 MED ORDER — ISOSORB DINITRATE-HYDRALAZINE 20-37.5 MG PO TABS
1.0000 | ORAL_TABLET | Freq: Three times a day (TID) | ORAL | Status: DC
  Administered 2018-11-02 – 2018-11-08 (×17): 1 via ORAL
  Filled 2018-11-02 (×17): qty 1

## 2018-11-02 MED ORDER — SODIUM CHLORIDE 0.9% FLUSH: 3.0000 mL | INTRAVENOUS | Status: DC | PRN

## 2018-11-02 MED ORDER — NICOTINE 21 MG/24HR TD PT24
21.0000 mg | MEDICATED_PATCH | Freq: Every day | TRANSDERMAL | Status: DC
  Administered 2018-11-02 – 2018-11-08 (×7): 21 mg via TRANSDERMAL
  Filled 2018-11-02 (×7): qty 1

## 2018-11-02 MED ORDER — MAGNESIUM SULFATE 2 GM/50ML IV SOLN
2.0000 g | Freq: Once | INTRAVENOUS | Status: AC
  Administered 2018-11-02: 2 g via INTRAVENOUS
  Filled 2018-11-02: qty 50

## 2018-11-02 MED ORDER — SODIUM CHLORIDE 0.9 % IV SOLN: 250.0000 mL | INTRAVENOUS | Status: DC

## 2018-11-02 MED ORDER — FUROSEMIDE 10 MG/ML IJ SOLN
120.0000 mg | Freq: Two times a day (BID) | INTRAVENOUS | Status: DC
  Administered 2018-11-02 – 2018-11-07 (×10): 120 mg via INTRAVENOUS
  Filled 2018-11-02 (×2): qty 10
  Filled 2018-11-02: qty 12
  Filled 2018-11-02 (×3): qty 10
  Filled 2018-11-02 (×2): qty 2
  Filled 2018-11-02: qty 10
  Filled 2018-11-02: qty 2

## 2018-11-02 MED ORDER — TOLVAPTAN 15 MG PO TABS
15.0000 mg | ORAL_TABLET | Freq: Once | ORAL | Status: AC
  Administered 2018-11-02: 15 mg via ORAL
  Filled 2018-11-02: qty 1

## 2018-11-02 MED ORDER — FUROSEMIDE 10 MG/ML IJ SOLN
120.0000 mg | Freq: Once | INTRAVENOUS | Status: AC
  Administered 2018-11-02: 120 mg via INTRAVENOUS
  Filled 2018-11-02: qty 10

## 2018-11-02 MED ORDER — SODIUM CHLORIDE 0.9% FLUSH
3.0000 mL | Freq: Two times a day (BID) | INTRAVENOUS | Status: DC
  Administered 2018-11-02 – 2018-11-05 (×4): 3 mL via INTRAVENOUS

## 2018-11-02 MED ORDER — APIXABAN 5 MG PO TABS
5.0000 mg | ORAL_TABLET | Freq: Two times a day (BID) | ORAL | Status: DC
  Administered 2018-11-02 – 2018-11-08 (×13): 5 mg via ORAL
  Filled 2018-11-02 (×13): qty 1

## 2018-11-02 NOTE — Progress Notes (Signed)
Subjective: Brad [name] was seen and evaluated at bedside on morning rounds. No acute events overnight. He feels his breathing has improved and was relieved he finally got some sleep last night. Also notes decreased swelling in his legs. Endorses good urine output. Denies headaches, lightheadedness, chest pain, abdominal pain. Discussed plan to continue diuresis and that cardiologist would be in to evaluate him as well. All questions and concerns were addressed.   Objective:  Vital signs in last 24 hours: Vitals:   11/01/18 1610 11/01/18 2124 11/02/18 0612 11/02/18 0801  BP: 113/78 137/87 (!) 123/93 (!) 109/91  Pulse: (!) 109 (!) 110 (!) 107 (!) 107  Resp: 18 18 18 18   Temp: 98.4 F (36.9 C) 98.6 F (37 C) 98 F (36.7 C) (!) 97.3 F (36.3 C)  TempSrc: Oral Oral Oral Oral  SpO2: 98% 98% 99% 98%  Weight:  82.1 kg    Height:       General: awake, alert, lying in bed in NAD Neck: elevated JVP CV: tachycardic rate, regular rhythm; 3/6 SEM over mitral area  Pulm: normal respiratory effort; lungs CTA bilaterally Abd: BS+; abdomen is soft, non-tender, non-distended Ext: trace bilateral edema   Assessment/Plan:  Principal Problem:   Acute on chronic combined systolic and diastolic heart failure (HCC) Active Problems:   Type 2 diabetes mellitus with complication, without long-term current use of insulin (HCC)   CKD (chronic kidney disease) stage 3, GFR 30-59 ml/min (HCC)   Cocaine abuse (HCC)   NSVT (nonsustained ventricular tachycardia) ([place])   Hyponatremia   Fall  1. Acute on chronic HFrEF - due to medication non-adherence and cocaine use - difficult to assess progress due to inaccurate I&Os; however lower extremity swelling has improved - will continue Lasix 120 BID - repleting mag today; renal function, K+ stable. continue to monitor daily BMPs and mag - daily weights; reiterated strict I&Os - will hold beta blocker until he is more euvolemic  - repeat echo pending -  greatly appreciate cardiology recommendations; they have discontinued amlodipine and started Bidil.  - considering RHC depending how he responds to diuresis over the next few days   2. Hyponatremia - patient has been chronically hyponatremic with recent HFrEF admissions; however, this is the lowest it has been  - given lack of neurologic signs or symptoms, it is likely this is chronic  - most likely hypervolumemic hypernatremia - cardiology has given dose of tolvaptan; should continue to improve with diuresis - will monitor carefully to not overcorrect by more than 6-8 mEq in 24 hours   3. Atrial flutter -telemetry reveals atrial flutter with sustained rates of 105-110; non-sustained runs of VT - anticoagulation has been discontinued in the past due to frequent falls - given that atrial flutter is likely to be causing worsening heart failure, cardiology feels cardioversion is indicated which requires anticoagulation for 1 month. He has been started on Eliquis - per cardiology note, they will speak with EP in regards to possible ablation. If not, plan is for TEE-guided DCCV   4. HTN - home regimen includes Amlodipine, Clonidine, Losartan, and Coreg - holding Coreg in setting of above - blood pressure stable on home Amlodipine  5. DM - sensitive sliding scale with meals and qhs  6. Substance abuse (EtOH and cocaine) - last endorses using cocaine on Monday  - UDS positive for cocaine - patient has been counseled extensively on cessation of substance use, particularly to decrease fall risk and worsening of his heart failure  -  on CIWA  Dispo: Anticipated discharge in approximately 3-4 day(s).   [place] Nunnery D, DO 11/02/2018, 1:20 PM Pager: [phone]

## 2018-11-02 NOTE — Consult Note (Deleted)
Error
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
## 2018-11-03 ENCOUNTER — Inpatient Hospital Stay (HOSPITAL_COMMUNITY): Payer: Medicaid Other

## 2018-11-03 DIAGNOSIS — I272 Pulmonary hypertension, unspecified: Secondary | ICD-10-CM

## 2018-11-03 DIAGNOSIS — I361 Nonrheumatic tricuspid (valve) insufficiency: Secondary | ICD-10-CM

## 2018-11-03 DIAGNOSIS — R21 Rash and other nonspecific skin eruption: Secondary | ICD-10-CM

## 2018-11-03 DIAGNOSIS — I351 Nonrheumatic aortic (valve) insufficiency: Secondary | ICD-10-CM

## 2018-11-03 LAB — ECHOCARDIOGRAM COMPLETE
Height: 71 in
Weight: 2840 oz

## 2018-11-03 LAB — BASIC METABOLIC PANEL
Anion gap: 7 (ref 5–15)
BUN: 21 mg/dL — ABNORMAL HIGH (ref 6–20)
CO2: 24 mmol/L (ref 22–32)
Calcium: 8.6 mg/dL — ABNORMAL LOW (ref 8.9–10.3)
Chloride: 93 mmol/L — ABNORMAL LOW (ref 98–111)
Creatinine, Ser: 2.07 mg/dL — ABNORMAL HIGH (ref 0.61–1.24)
GFR calc Af Amer: 39 mL/min — ABNORMAL LOW (ref >60)
GFR calc non Af Amer: 34 mL/min — ABNORMAL LOW (ref >60)
Glucose, Bld: 202 mg/dL — ABNORMAL HIGH (ref 70–99)
Potassium: 3.3 mmol/L — ABNORMAL LOW (ref 3.5–5.1)
Sodium: 124 mmol/L — ABNORMAL LOW (ref 135–145)

## 2018-11-03 LAB — GLUCOSE, CAPILLARY
Glucose-Capillary: 206 mg/dL — ABNORMAL HIGH (ref 70–99)
Glucose-Capillary: 117 mg/dL — ABNORMAL HIGH (ref 70–99)
Glucose-Capillary: 178 mg/dL — ABNORMAL HIGH (ref 70–99)
Glucose-Capillary: 203 mg/dL — ABNORMAL HIGH (ref 70–99)

## 2018-11-03 LAB — MAGNESIUM: Magnesium: 2 mg/dL (ref 1.7–2.4)

## 2018-11-03 MED ORDER — SODIUM CHLORIDE 0.9% FLUSH
3.0000 mL | Freq: Two times a day (BID) | INTRAVENOUS | Status: DC
  Administered 2018-11-03 (×2): 3 mL via INTRAVENOUS
  Administered 2018-11-04: 10 mL via INTRAVENOUS
  Administered 2018-11-05 (×2): 3 mL via INTRAVENOUS

## 2018-11-03 MED ORDER — POTASSIUM CHLORIDE CRYS ER 20 MEQ PO TBCR
40.0000 meq | EXTENDED_RELEASE_TABLET | Freq: Three times a day (TID) | ORAL | Status: AC
  Administered 2018-11-03 (×3): 40 meq via ORAL
  Filled 2018-11-03 (×3): qty 2

## 2018-11-03 MED ORDER — TOLVAPTAN 15 MG PO TABS
15.0000 mg | ORAL_TABLET | Freq: Once | ORAL | Status: AC
  Administered 2018-11-03: 15 mg via ORAL
  Filled 2018-11-03: qty 1

## 2018-11-03 MED ORDER — SPIRONOLACTONE 12.5 MG HALF TABLET
12.5000 mg | ORAL_TABLET | Freq: Every day | ORAL | Status: DC
  Administered 2018-11-03: 12.5 mg via ORAL
  Filled 2018-11-03 (×2): qty 1

## 2018-11-03 MED ORDER — POTASSIUM CHLORIDE CRYS ER 20 MEQ PO TBCR
40.0000 meq | EXTENDED_RELEASE_TABLET | Freq: Once | ORAL | Status: AC
  Administered 2018-11-03: 40 meq via ORAL
  Filled 2018-11-03: qty 2

## 2018-11-03 MED ORDER — SODIUM CHLORIDE 0.9 % IV SOLN: 250.0000 mL | INTRAVENOUS | Status: DC

## 2018-11-03 MED ORDER — POTASSIUM CHLORIDE 10 MEQ/100ML IV SOLN
10.0000 meq | INTRAVENOUS | Status: DC
  Administered 2018-11-03: 10 meq via INTRAVENOUS
  Filled 2018-11-03: qty 100

## 2018-11-03 MED ORDER — SODIUM CHLORIDE 0.9% FLUSH: 3.0000 mL | INTRAVENOUS | Status: DC | PRN

## 2018-11-03 MED ORDER — POTASSIUM CHLORIDE CRYS ER 20 MEQ PO TBCR: 40.0000 meq | EXTENDED_RELEASE_TABLET | Freq: Every day | ORAL | Status: DC

## 2018-11-03 MED ORDER — SODIUM CHLORIDE 0.9 % IV SOLN
INTRAVENOUS | Status: DC | PRN
  Administered 2018-11-03 – 2018-11-05 (×2): 250 mL via INTRAVENOUS

## 2018-11-03 MED ORDER — NYSTATIN 100000 UNIT/GM EX POWD
Freq: Three times a day (TID) | CUTANEOUS | Status: DC
  Administered 2018-11-03 – 2018-11-08 (×14): via TOPICAL
  Filled 2018-11-03: qty 15

## 2018-11-03 NOTE — Progress Notes (Signed)
  Echocardiogram 2D Echocardiogram has been performed.  Madelaine Etienne 11/03/2018, 9:27 AM

## 2018-11-03 NOTE — Progress Notes (Addendum)
Advanced Heart Failure Rounding Note  PCP-Cardiologist: [name], MD   Subjective:    Started on Eliquis yesterday for atrial flutter. EP consulted. He is not a candidate for ablation or ICD. Plan for TEE/DCCV tomorrow after 5th dose of Eliquis.  Amlodipine stopped and started on bidil. SBP 90-130s.  Remains on IV lasix with I/O -980 mls. Creatinine improving with diuresis. 2.23 > 2.07. K 3.3  Received tolvaptan yesterday. Na 119 > 124.  Repeat echo results pending.   Feels better this am. Less SOB. IV is burning. No further falls. Says he needs a cane for home. PT to see him today.  Objective:   Weight Range: 80.5 kg Body mass index is 24.76 kg/m.   Vital Signs:   Temp:  [97.5 F (36.4 C)-97.9 F (36.6 C)] 97.6 F (36.4 C) (11/13 0457) Pulse Rate:  [77-111] 77 (11/13 0457) Resp:  [18-20] 20 (11/13 0457) BP: (75-109)/(53-78) 109/78 (11/13 0457) SpO2:  [97 %-100 %] 97 % (11/13 0457) Weight:  [80.5 kg] 80.5 kg (11/12 2011) Last BM Date: 11/02/18  Weight change: Filed Weights   11/01/18 0456 11/01/18 2124 11/02/18 2011  Weight: 82.1 kg 82.1 kg 80.5 kg    Intake/Output:   Intake/Output Summary (Last 24 hours) at 11/03/2018 0927 Last data filed at 11/03/2018 0400 Gross per 24 hour  Intake 470 ml  Output 1750 ml  Net -1280 ml      Physical Exam    General:   No resp difficulty HEENT: Normal Neck: Supple. JVP 9-10. Carotids 2+ bilat; no bruits. No lymphadenopathy or thyromegaly appreciated. Cor: PMI nondisplaced. Tachy, regular. +s3. 3/6 HSM apex.  Lungs: Clear Abdomen: Soft, nontender, nondistended. No hepatosplenomegaly. No bruits or masses. Good bowel sounds. Extremities: No cyanosis, clubbing, rash, edema Neuro: Alert & orientedx3, cranial nerves grossly intact. moves all 4 extremities w/o difficulty. Affect pleasant   Telemetry   Aflutter 70-110s. Personally reviewed.   EKG    No new tracings.   Labs    CBC Recent Labs   10/31/18 1032 11/01/18 0623  WBC 4.3 5.8  NEUTROABS 2.8  --   HGB 11.2* 11.3*  HCT 32.9* 33.1*  MCV 88.7 88.0  PLT 156 086*   Basic Metabolic Panel Recent Labs    11/02/18 0247 11/02/18 0823 11/03/18 0612  NA 119* 119* 124*  K 4.5 4.1 3.3*  CL 87* 88* 93*  CO2 24 22 24   GLUCOSE 166* 144* 202*  BUN 20 20 21*  CREATININE 2.29* 2.23* 2.07*  CALCIUM 9.0 8.8* 8.6*  MG 1.8  --  2.0   Liver Function Tests Recent Labs    10/31/18 1559  AST 26  ALT 17  ALKPHOS 50  BILITOT 1.8*  PROT 8.3*  ALBUMIN 3.4*   No results for input(s): LIPASE, AMYLASE in the last 72 hours. Cardiac Enzymes Recent Labs    10/31/18 1559 10/31/18 2226 11/01/18 0257  TROPONINI 0.05* 0.05* 0.04*    BNP: BNP (last 3 results) Recent Labs    08/20/18 1410 08/26/18 0404 10/31/18 1604  BNP 757.1* 953.7* 1,446.3*    ProBNP (last 3 results) No results for input(s): PROBNP in the last 8760 hours.   D-Dimer No results for input(s): DDIMER in the last 72 hours. Hemoglobin A1C No results for input(s): HGBA1C in the last 72 hours. Fasting Lipid Panel No results for input(s): CHOL, HDL, LDLCALC, TRIG, CHOLHDL, LDLDIRECT in the last 72 hours. Thyroid Function Tests No results for input(s): TSH, T4TOTAL, T3FREE, THYROIDAB  in the last 72 hours.  Invalid input(s): FREET3  Other results:   Imaging     No results found.   Medications:     Scheduled Medications: . amitriptyline  75 mg Oral QHS  . apixaban  5 mg Oral BID  . atorvastatin  40 mg Oral Daily  . insulin aspart  0-5 Units Subcutaneous QHS  . insulin aspart  0-9 Units Subcutaneous TID WC  . isosorbide-hydrALAZINE  1 tablet Oral TID  . nicotine  21 mg Transdermal Daily  . sodium chloride flush  3 mL Intravenous Q12H  . sodium chloride flush  3 mL Intravenous Q12H     Infusions: . sodium chloride    . sodium chloride    . furosemide 120 mg (11/03/18 0845)  . potassium chloride 10 mEq (11/03/18 0856)     PRN  Medications:  acetaminophen **OR** acetaminophen, ondansetron **OR** ondansetron (ZOFRAN) IV, polyethylene glycol, sodium chloride flush, sodium chloride flush    Patient Profile   Brad [name] is a 57 y.o. male with a history of chronic systolic HF due to NICM (EF 20-25%), atrial fibrillation, cocaine and heroin use, alcohol abuse, medication noncompliance, DM, hyponatremia, frequent falls, CKD 3, hepatitis C, cirrhosis, and NSVT.   Admitted 11/10 with SOB and BLE edema.   Assessment/Plan   1. A/C systolic HF due to NICM. LHC 2017 with mild nonobstructive CAD.  - Echo 07/2018: EF 20-25%, diffuse HK, reversible restrictive pattern, grade 3 DD, trivial AI, moderate MR, LA severely dilated, RV normal function, RA mod dilated, mild TR, PA peak pressure 50 mm Hg  - Volume improving - Continue 120 mg IV lasix BID. Creatinine improving with diuresis. - He was on spiro and losartan at home, but ran out of all meds for at least 1 week. On hold now with AKI. Consider adding back spiro soon. Creatinine 2.06 - Coreg held with acute exacerbation.  - Not a candidate for advanced therapies with ongoing substance abuse.   2. Atrial flutter - Probably contributing to his HF, HR 70-100s - Remains in atrial flutter. Started on Eliquis yesterday.  - EP consulted. Not a candidate for any EP procedures. Would require longterm AC due to severe LA enlargement. Recommended conservative approach.  - Will plan for TEE/DCCV tomorrow after he has had 5 doses of Eliquis. He will need to stay on [facility] for at least 1 month. Hopefully with improving Na and abstaining from ETOH and cocaine, he will not fall.   3. CKD 3 - Baseline creatinine seems to be 1.4-1.8, lots of variation. - Creatinine improving with diuresis 2.23 > 2.07  4. Hyponatremia - Na 112 on admit > 119 > 15 mg tolvaptan > 124. Repeat tolvaptan today.  - Restrict free water. He is on a 1.5 L fluid restriction  5. HTN - Well controlled with  SBP 90-130s. Would not restart amlodipine or clonidine. Would titrate HF medications first.   6. Mild nonobstructive CAD on LHC 2017 - Continue ASA and statin.  - No s/s ischemia.  7. Substance abuse - Last did cocaine 1 week ago. Says he "slipped up". Has not done heroine since 2013. - Encouraged cessation - Smokes 1 PPD. Was apparently smoking in bathroom last night and had his cigarettes taken away. Continue nicotine patch.  - Drinks 1 40 oz beer daily  8. NSVT - Mag 2.0. K 3.3. Change IV supp to PO.   9. Frequent falls - Had a fall 2 days ago here in the hospital  bathroom. Says he has blurry vision now. Discussed with Dr Aundra Dubin. Okay to continue eliquis. He has no HA. No further complaints.  10 Mod MR - Will plan to assess better with TEE tomorrow   11. Noncompliance - Has medicaid and says he ran out of meds because he had no refills. Has difficulty with transportation. Has not used [facility] before, but has medicaid. Does not have a scale. Poor follow up. He lives in [place]. Could refer to HF paramedicine program.    Length of Stay: [place], NP  11/03/2018, 9:27 AM  Advanced Heart Failure Team Pager [phone] (M-F; 7a - 4p)  Please contact [facility] for night-coverage after hours (4p -7a ) and weekends on [email or web address]  Patient seen with NP, agree with the above note.   He appears to have diuresed well over the last day, weight is down.  Breathing improved. Na rising, up to 124 today after tolvaptan yesterday.   On exam, JVP 12 cm with trace ankle edema.  Irregular S1S2 with 2/6 HSM apex and S3.  Clear lungs.   1. Acute on chronic systolic CHF: Nonischemic cardiomyopathy based on coronary angiography in 2017.  Echo in 8/19 with EF 20-25%, moderate MR.  Suspect CMP is related to substance abuse => ongoing heavy ETOH and also cocaine abuse.  Atrial flutter likely worsens his symptoms.  He is volume overloaded on exam still but diuresed well yesterday.   Creatinine trending down, 2 today.  - Lasix 120 mg IV bid again today.  Need to strictly record I/Os, discussed this with patient.  - Will give tolvaptan 30 mg x 1 today.  Should help with diuresis and raise sodium.  - Continue Bidil 1 tab tid, can titrate up as BP tolerates.  - Add spironolactone 12.5 mg daily.  - When he is better diuresed, can probably go back on low dose Coreg.  Risk with cocaine is not high as it is also an alpha blocker.  We again discussed cocaine heart risk and he says that he will stay off it.  - May need RHC, will see how diuresis goes over the next couple days.   - Also imperative to stop ETOH.  We discussed this again.  - Need to get him back in NSR => see below.  - Repeat echo pending.  With loud mitral area murmur, concerned for mitral regurgitation.  - With active substance abuse, not candidate for ICD.  2. Atrial flutter: Patient appears to have been in flutter for several months.  HR in 90s-100s.  Suspect this worsens CHF.  He has not been anticoagulated due to falls (likely from intoxication or hyponatremia, no syncope).  I think it would be worthwhile to attempt Cardioversion. To do this, he will have to be on anticoagulation for at least a month.  If he can remain abstinent from substance abuse, this probably will be feasible.  EP has seen, do not think he is a candidate for flutter ablation.  - Continue apixaban 5 mg bid.  Will plan TEE-guided DCCV (can also more closely assess mitral regurgitation on TEE) after 5th dose (tomorrow).  Discussed risks/benefits with patient and he agrees.  3. Hyponatremia: Hypervolemic hyponatremia.  Suspect due to CHF and excessive fluid intake (much of it likely ETOH prior to admission).  Slow rise in Na from 112=>119 => 124.  He did not have marked symptoms of hyponatremia which suggests chronicity but sodium level a couple months ago was in the 130s.   -  Continue fluid restriction.  - Tolvaptan 30 mg today.  This will help  diurese but also bring up sodium gradually.  4. AKI on CKD stage 3: Creatinine down to 2, better with diuresis.    5. Substance abuse: Cocaine, ETOH, cigarettes.  I discussed the contribution of these to heart disease.  He understands and is going to try to stay off when he leaves the hospital.   Loralie Champagne 11/03/2018 1:06 PM

## 2018-11-03 NOTE — Hospital Discharge Follow-Up (Signed)
Met with the patient again today. He explained that his sister helped him to get his apartment and she is going to get him basic furniture to get started as he currently has no furniture. He said that the rent is $600+/month and he will be responsible for paying the rent.  Encouraged him to consider having a payee to help manage his money.   Reminded him that if he needs medication refills he should contact [facility] and if he is not able to afford the copays , [facility] will work with him so that he can get the medications. They will also deliver to his home.   Also reminded him that he will need to reschedule his appointment with [facility].  He still did not have his phone # but provided this CM with his friend's #  - Ginger # [phone]   Message left for Crawford Givens, LCSW noting that [facility] will continue to follow the patient.
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
## 2018-11-03 NOTE — H&P (View-Only) (Signed)
Advanced Heart Failure Rounding Note  PCP-Cardiologist: [name], MD   Subjective:    Started on Eliquis yesterday for atrial flutter. EP consulted. He is not a candidate for ablation or ICD. Plan for TEE/DCCV tomorrow after 5th dose of Eliquis.  Amlodipine stopped and started on bidil. SBP 90-130s.  Remains on IV lasix with I/O -980 mls. Creatinine improving with diuresis. 2.23 > 2.07. K 3.3  Received tolvaptan yesterday. Na 119 > 124.  Repeat echo results pending.   Feels better this am. Less SOB. IV is burning. No further falls. Says he needs a cane for home. PT to see him today.  Objective:   Weight Range: 80.5 kg Body mass index is 24.76 kg/m.   Vital Signs:   Temp:  [97.5 F (36.4 C)-97.9 F (36.6 C)] 97.6 F (36.4 C) (11/13 0457) Pulse Rate:  [77-111] 77 (11/13 0457) Resp:  [18-20] 20 (11/13 0457) BP: (75-109)/(53-78) 109/78 (11/13 0457) SpO2:  [97 %-100 %] 97 % (11/13 0457) Weight:  [80.5 kg] 80.5 kg (11/12 2011) Last BM Date: 11/02/18  Weight change: Filed Weights   11/01/18 0456 11/01/18 2124 11/02/18 2011  Weight: 82.1 kg 82.1 kg 80.5 kg    Intake/Output:   Intake/Output Summary (Last 24 hours) at 11/03/2018 0927 Last data filed at 11/03/2018 0400 Gross per 24 hour  Intake 470 ml  Output 1750 ml  Net -1280 ml      Physical Exam    General:   No resp difficulty HEENT: Normal Neck: Supple. JVP 9-10. Carotids 2+ bilat; no bruits. No lymphadenopathy or thyromegaly appreciated. Cor: PMI nondisplaced. Tachy, regular. +s3. 3/6 HSM apex.  Lungs: Clear Abdomen: Soft, nontender, nondistended. No hepatosplenomegaly. No bruits or masses. Good bowel sounds. Extremities: No cyanosis, clubbing, rash, edema Neuro: Alert & orientedx3, cranial nerves grossly intact. moves all 4 extremities w/o difficulty. Affect pleasant   Telemetry   Aflutter 70-110s. Personally reviewed.   EKG    No new tracings.   Labs    CBC Recent Labs   10/31/18 1032 11/01/18 0623  WBC 4.3 5.8  NEUTROABS 2.8  --   HGB 11.2* 11.3*  HCT 32.9* 33.1*  MCV 88.7 88.0  PLT 156 676*   Basic Metabolic Panel Recent Labs    11/02/18 0247 11/02/18 0823 11/03/18 0612  NA 119* 119* 124*  K 4.5 4.1 3.3*  CL 87* 88* 93*  CO2 24 22 24   GLUCOSE 166* 144* 202*  BUN 20 20 21*  CREATININE 2.29* 2.23* 2.07*  CALCIUM 9.0 8.8* 8.6*  MG 1.8  --  2.0   Liver Function Tests Recent Labs    10/31/18 1559  AST 26  ALT 17  ALKPHOS 50  BILITOT 1.8*  PROT 8.3*  ALBUMIN 3.4*   No results for input(s): LIPASE, AMYLASE in the last 72 hours. Cardiac Enzymes Recent Labs    10/31/18 1559 10/31/18 2226 11/01/18 0257  TROPONINI 0.05* 0.05* 0.04*    BNP: BNP (last 3 results) Recent Labs    08/20/18 1410 08/26/18 0404 10/31/18 1604  BNP 757.1* 953.7* 1,446.3*    ProBNP (last 3 results) No results for input(s): PROBNP in the last 8760 hours.   D-Dimer No results for input(s): DDIMER in the last 72 hours. Hemoglobin A1C No results for input(s): HGBA1C in the last 72 hours. Fasting Lipid Panel No results for input(s): CHOL, HDL, LDLCALC, TRIG, CHOLHDL, LDLDIRECT in the last 72 hours. Thyroid Function Tests No results for input(s): TSH, T4TOTAL, T3FREE, THYROIDAB  in the last 72 hours.  Invalid input(s): FREET3  Other results:   Imaging     No results found.   Medications:     Scheduled Medications: . amitriptyline  75 mg Oral QHS  . apixaban  5 mg Oral BID  . atorvastatin  40 mg Oral Daily  . insulin aspart  0-5 Units Subcutaneous QHS  . insulin aspart  0-9 Units Subcutaneous TID WC  . isosorbide-hydrALAZINE  1 tablet Oral TID  . nicotine  21 mg Transdermal Daily  . sodium chloride flush  3 mL Intravenous Q12H  . sodium chloride flush  3 mL Intravenous Q12H     Infusions: . sodium chloride    . sodium chloride    . furosemide 120 mg (11/03/18 0845)  . potassium chloride 10 mEq (11/03/18 0856)     PRN  Medications:  acetaminophen **OR** acetaminophen, ondansetron **OR** ondansetron (ZOFRAN) IV, polyethylene glycol, sodium chloride flush, sodium chloride flush    Patient Profile   Brad [name] is a 57 y.o. male with a history of chronic systolic HF due to NICM (EF 20-25%), atrial fibrillation, cocaine and heroin use, alcohol abuse, medication noncompliance, DM, hyponatremia, frequent falls, CKD 3, hepatitis C, cirrhosis, and NSVT.   Admitted 11/10 with SOB and BLE edema.   Assessment/Plan   1. A/C systolic HF due to NICM. LHC 2017 with mild nonobstructive CAD.  - Echo 07/2018: EF 20-25%, diffuse HK, reversible restrictive pattern, grade 3 DD, trivial AI, moderate MR, LA severely dilated, RV normal function, RA mod dilated, mild TR, PA peak pressure 50 mm Hg  - Volume improving - Continue 120 mg IV lasix BID. Creatinine improving with diuresis. - He was on spiro and losartan at home, but ran out of all meds for at least 1 week. On hold now with AKI. Consider adding back spiro soon. Creatinine 2.06 - Coreg held with acute exacerbation.  - Not a candidate for advanced therapies with ongoing substance abuse.   2. Atrial flutter - Probably contributing to his HF, HR 70-100s - Remains in atrial flutter. Started on Eliquis yesterday.  - EP consulted. Not a candidate for any EP procedures. Would require longterm AC due to severe LA enlargement. Recommended conservative approach.  - Will plan for TEE/DCCV tomorrow after he has had 5 doses of Eliquis. He will need to stay on [facility] for at least 1 month. Hopefully with improving Na and abstaining from ETOH and cocaine, he will not fall.   3. CKD 3 - Baseline creatinine seems to be 1.4-1.8, lots of variation. - Creatinine improving with diuresis 2.23 > 2.07  4. Hyponatremia - Na 112 on admit > 119 > 15 mg tolvaptan > 124. Repeat tolvaptan today.  - Restrict free water. He is on a 1.5 L fluid restriction  5. HTN - Well controlled with  SBP 90-130s. Would not restart amlodipine or clonidine. Would titrate HF medications first.   6. Mild nonobstructive CAD on LHC 2017 - Continue ASA and statin.  - No s/s ischemia.  7. Substance abuse - Last did cocaine 1 week ago. Says he "slipped up". Has not done heroine since 2013. - Encouraged cessation - Smokes 1 PPD. Was apparently smoking in bathroom last night and had his cigarettes taken away. Continue nicotine patch.  - Drinks 1 40 oz beer daily  8. NSVT - Mag 2.0. K 3.3. Change IV supp to PO.   9. Frequent falls - Had a fall 2 days ago here in the hospital  bathroom. Says he has blurry vision now. Discussed with Dr Aundra Dubin. Okay to continue eliquis. He has no HA. No further complaints.  10 Mod MR - Will plan to assess better with TEE tomorrow   11. Noncompliance - Has medicaid and says he ran out of meds because he had no refills. Has difficulty with transportation. Has not used [facility] before, but has medicaid. Does not have a scale. Poor follow up. He lives in [place]. Could refer to HF paramedicine program.    Length of Stay: [place], NP  11/03/2018, 9:27 AM  Advanced Heart Failure Team Pager [phone] (M-F; 7a - 4p)  Please contact [facility] for night-coverage after hours (4p -7a ) and weekends on [email or web address]  Patient seen with NP, agree with the above note.   He appears to have diuresed well over the last day, weight is down.  Breathing improved. Na rising, up to 124 today after tolvaptan yesterday.   On exam, JVP 12 cm with trace ankle edema.  Irregular S1S2 with 2/6 HSM apex and S3.  Clear lungs.   1. Acute on chronic systolic CHF: Nonischemic cardiomyopathy based on coronary angiography in 2017.  Echo in 8/19 with EF 20-25%, moderate MR.  Suspect CMP is related to substance abuse => ongoing heavy ETOH and also cocaine abuse.  Atrial flutter likely worsens his symptoms.  He is volume overloaded on exam still but diuresed well yesterday.   Creatinine trending down, 2 today.  - Lasix 120 mg IV bid again today.  Need to strictly record I/Os, discussed this with patient.  - Will give tolvaptan 30 mg x 1 today.  Should help with diuresis and raise sodium.  - Continue Bidil 1 tab tid, can titrate up as BP tolerates.  - Add spironolactone 12.5 mg daily.  - When he is better diuresed, can probably go back on low dose Coreg.  Risk with cocaine is not high as it is also an alpha blocker.  We again discussed cocaine heart risk and he says that he will stay off it.  - May need RHC, will see how diuresis goes over the next couple days.   - Also imperative to stop ETOH.  We discussed this again.  - Need to get him back in NSR => see below.  - Repeat echo pending.  With loud mitral area murmur, concerned for mitral regurgitation.  - With active substance abuse, not candidate for ICD.  2. Atrial flutter: Patient appears to have been in flutter for several months.  HR in 90s-100s.  Suspect this worsens CHF.  He has not been anticoagulated due to falls (likely from intoxication or hyponatremia, no syncope).  I think it would be worthwhile to attempt Cardioversion. To do this, he will have to be on anticoagulation for at least a month.  If he can remain abstinent from substance abuse, this probably will be feasible.  EP has seen, do not think he is a candidate for flutter ablation.  - Continue apixaban 5 mg bid.  Will plan TEE-guided DCCV (can also more closely assess mitral regurgitation on TEE) after 5th dose (tomorrow).  Discussed risks/benefits with patient and he agrees.  3. Hyponatremia: Hypervolemic hyponatremia.  Suspect due to CHF and excessive fluid intake (much of it likely ETOH prior to admission).  Slow rise in Na from 112=>119 => 124.  He did not have marked symptoms of hyponatremia which suggests chronicity but sodium level a couple months ago was in the 130s.   -  Continue fluid restriction.  - Tolvaptan 30 mg today.  This will help  diurese but also bring up sodium gradually.  4. AKI on CKD stage 3: Creatinine down to 2, better with diuresis.    5. Substance abuse: Cocaine, ETOH, cigarettes.  I discussed the contribution of these to heart disease.  He understands and is going to try to stay off when he leaves the hospital.   Loralie Champagne 11/03/2018 1:06 PM

## 2018-11-03 NOTE — Progress Notes (Addendum)
Subjective: Brad [name] was seen and evaluated at bedside. No acute events overnight. Patient is feeling better. He slept well again last night. Denies any shortness of breath with exertion, chest pain, cough, orthopnea, light headedness. Still complains of intermittent spasms in his arms and legs. Also complains of lower abdominal tightness which he thinks may be related to coughing so much prior to coming in. Patient complains of rash in his groin that has become more painful today. First noticed it 3-4 days ago; states he has not been sexually active for "many many years."  Discussed plan to continue diuresis and have cardioversion done tomorrow morning. All questions and concerns were addressed.    Objective:  Vital signs in last 24 hours: Vitals:   11/02/18 0801 11/02/18 1651 11/02/18 2011 11/03/18 0457  BP: (!) 109/91 (!) 75/53 92/61 109/78  Pulse: (!) 107 (!) 105 (!) 111 77  Resp: 18 18 18 20   Temp: (!) 97.3 F (36.3 C) (!) 97.5 F (36.4 C) 97.9 F (36.6 C) 97.6 F (36.4 C)  TempSrc: Oral Oral Oral Oral  SpO2: 98% 100% 100% 97%  Weight:   80.5 kg   Height:       General: awake, alert, lying in bed in NAD Neck: JVP up to mid-neck which is improved from yesterday  CV: tachy, irregular; SEM over apex  Pulm: normal respiratory effort; lungs CTA bilaterally Abd: BS+; abdomen is non-distended, mild TTP across lower abdomen  Ext: trace lower extremity edema GU: large ulcerating rash in left inner thigh region with scrotal involvement; tender to palpation; no penile involvement or discharge   Assessment/Plan:  Principal Problem:   Acute on chronic combined systolic and diastolic heart failure (HCC) Active Problems:   Type 2 diabetes mellitus with complication, without long-term current use of insulin (HCC)   CKD (chronic kidney disease) stage 3, GFR 30-59 ml/min (HCC)   Cocaine abuse (HCC)   NSVT (nonsustained ventricular tachycardia) ([place])   Hyponatremia   Fall  1. Acute  on chronic HFrEF -due to medication non-adherence and cocaine use - repeat echo shows EF unchanged (20-25%), severe mitral regurge and moderate to severe tricuspid regurge. Severe biatrial enlargement; moderate RV dysfunction  -seems to be responding well to increased diuresis; he has dropped 2 kg since admission.  -will continue Lasix 120 BID - repleting K today; renal function, mag stable. continue to monitor daily BMPs and mag - daily weights; reiterated strict I&Os - will continue low dose Coreg once better diuresed - Started on Spironolactone 12.5 mg - continue Bidil  - greatly appreciate cardiology recommendations   2. Hyponatremia - secondary to hypervolemia in the setting of acute on chronic HFrEF - given lack of neurologic signs or symptoms, it is likely this is chronic  - Na improved to 124; cardiology has given additional dose of tolvaptan; should continue to improve with further diuresis -will monitor carefully to not overcorrectby more than 6-8 mEq in 24 hours  3. Atrial flutter -telemetry reveals atrial flutter with sustained rates of 105-110; non-sustained runs of VT - currently anticoagulated with Eliquis  - EP did not feel he was a good candidate for ablation or ICD - plan for TEE-guided DCCV tomorrow morning which will hopefully make HFrEF more manageable to treat   4. HTN - blood pressure stable on Bidil  - holding Coreg in setting of above  5. DM - sensitive sliding scale with meals and qhs  6. Substance abuse (EtOH and cocaine) - patient has been  counseled extensively on cessation of substance use which he seems amenable to   7. Rash  - no obvious vesicular lesions; obtaining HSV and candidal swabs   Dispo: Anticipated discharge in approximately 3-4 day(s).   Modena Nunnery D, DO 11/03/2018, 7:16 AM Pager: [phone]

## 2018-11-04 ENCOUNTER — Encounter (HOSPITAL_COMMUNITY): Payer: Self-pay | Admitting: *Deleted

## 2018-11-04 ENCOUNTER — Inpatient Hospital Stay (HOSPITAL_COMMUNITY): Payer: Medicaid Other | Admitting: Certified Registered Nurse Anesthetist

## 2018-11-04 ENCOUNTER — Encounter (HOSPITAL_COMMUNITY)
Admission: EM | Disposition: A | Discharge status: Home / Self Care | Payer: Self-pay | Source: Home / Self Care | Attending: Internal Medicine

## 2018-11-04 ENCOUNTER — Inpatient Hospital Stay (HOSPITAL_COMMUNITY): Payer: Medicaid Other

## 2018-11-04 DIAGNOSIS — L304 Erythema intertrigo: Secondary | ICD-10-CM

## 2018-11-04 DIAGNOSIS — I340 Nonrheumatic mitral (valve) insufficiency: Secondary | ICD-10-CM

## 2018-11-04 DIAGNOSIS — I4892 Unspecified atrial flutter: Secondary | ICD-10-CM

## 2018-11-04 HISTORY — PX: TEE WITHOUT CARDIOVERSION: SHX5443

## 2018-11-04 HISTORY — PX: CARDIOVERSION: SHX1299

## 2018-11-04 LAB — GLUCOSE, CAPILLARY
Glucose-Capillary: 148 mg/dL — ABNORMAL HIGH (ref 70–99)
Glucose-Capillary: 165 mg/dL — ABNORMAL HIGH (ref 70–99)
Glucose-Capillary: 229 mg/dL — ABNORMAL HIGH (ref 70–99)
Glucose-Capillary: 150 mg/dL — ABNORMAL HIGH (ref 70–99)

## 2018-11-04 LAB — BASIC METABOLIC PANEL
Anion gap: 10 (ref 5–15)
BUN: 21 mg/dL — ABNORMAL HIGH (ref 6–20)
CO2: 23 mmol/L (ref 22–32)
Calcium: 8.7 mg/dL — ABNORMAL LOW (ref 8.9–10.3)
Chloride: 100 mmol/L (ref 98–111)
Creatinine, Ser: 1.89 mg/dL — ABNORMAL HIGH (ref 0.61–1.24)
GFR calc Af Amer: 43 mL/min — ABNORMAL LOW (ref >60)
GFR calc non Af Amer: 38 mL/min — ABNORMAL LOW (ref >60)
Glucose, Bld: 141 mg/dL — ABNORMAL HIGH (ref 70–99)
Potassium: 4.6 mmol/L (ref 3.5–5.1)
Sodium: 133 mmol/L — ABNORMAL LOW (ref 135–145)

## 2018-11-04 LAB — CBC
HCT: 36.2 % — ABNORMAL LOW (ref 39.0–52.0)
Hemoglobin: 11.9 g/dL — ABNORMAL LOW (ref 13.0–17.0)
MCH: 29.5 pg (ref 26.0–34.0)
MCHC: 32.9 g/dL (ref 30.0–36.0)
MCV: 89.8 fL (ref 80.0–100.0)
Platelets: 99 10*3/uL — ABNORMAL LOW (ref 150–400)
RBC: 4.03 MIL/uL — ABNORMAL LOW (ref 4.22–5.81)
RDW: 14.1 % (ref 11.5–15.5)
WBC: 6.9 10*3/uL (ref 4.0–10.5)
nRBC: 0 % (ref 0.0–0.2)

## 2018-11-04 SURGERY — ECHOCARDIOGRAM, TRANSESOPHAGEAL
Anesthesia: General

## 2018-11-04 MED ORDER — PROPOFOL 10 MG/ML IV BOLUS
INTRAVENOUS | Status: DC | PRN
  Administered 2018-11-04: 20 mg via INTRAVENOUS

## 2018-11-04 MED ORDER — PROPOFOL 500 MG/50ML IV EMUL
INTRAVENOUS | Status: DC | PRN
  Administered 2018-11-04: 175 ug/kg/min via INTRAVENOUS

## 2018-11-04 MED ORDER — CARVEDILOL 3.125 MG PO TABS
3.1250 mg | ORAL_TABLET | Freq: Two times a day (BID) | ORAL | Status: DC
  Administered 2018-11-04 – 2018-11-07 (×6): 3.125 mg via ORAL
  Filled 2018-11-04 (×6): qty 1

## 2018-11-04 MED ORDER — LIDOCAINE HCL (CARDIAC) PF 100 MG/5ML IV SOSY
PREFILLED_SYRINGE | INTRAVENOUS | Status: DC | PRN
  Administered 2018-11-04: 60 mg via INTRATRACHEAL

## 2018-11-04 MED ORDER — SPIRONOLACTONE 25 MG PO TABS
25.0000 mg | ORAL_TABLET | Freq: Every day | ORAL | Status: DC
  Administered 2018-11-04 – 2018-11-08 (×5): 25 mg via ORAL
  Filled 2018-11-04 (×5): qty 1

## 2018-11-04 MED ORDER — LACTATED RINGERS IV SOLN
INTRAVENOUS | Status: DC | PRN
  Administered 2018-11-04: 14:00:00 via INTRAVENOUS

## 2018-11-04 NOTE — Interval H&P Note (Signed)
History and Physical Interval Note:  11/04/2018 1:54 PM  Brad [name]  has presented today for surgery, with the diagnosis of ATRIAL FLUTTER  The various methods of treatment have been discussed with the patient and family. After consideration of risks, benefits and other options for treatment, the patient has consented to  Procedure(s): TRANSESOPHAGEAL ECHOCARDIOGRAM (TEE) (N/A) CARDIOVERSION (N/A) as a surgical intervention .  The patient's history has been reviewed, patient examined, no change in status, stable for surgery.  I have reviewed the patient's chart and labs.  Questions were answered to the patient's satisfaction.     [name] [employer]

## 2018-11-04 NOTE — Progress Notes (Signed)
Pt back from cardioversion.  Tele SR 88-90.  Pt alert and oriented X 4.  VSS. Denies pain at this time.  Pleasant.  Hungry.

## 2018-11-04 NOTE — Anesthesia Preprocedure Evaluation (Addendum)
Anesthesia Evaluation  Patient identified by MRN, date of birth, ID band Patient awake    Reviewed: Allergy & Precautions, NPO status , Patient's Chart, lab work & pertinent test results  Airway Mallampati: II  TM Distance: >3 FB Neck ROM: Full    Dental  (+) Missing, Poor Dentition   Pulmonary Current Smoker,    Pulmonary exam normal breath sounds clear to auscultation       Cardiovascular hypertension, Pt. on medications and Pt. on home beta blockers + CAD, + Past MI and +CHF  + dysrhythmias Atrial Fibrillation  Rhythm:Irregular Rate:Tachycardia     Neuro/Psych PSYCHIATRIC DISORDERS Depression CVA    GI/Hepatic GERD  Medicated and Controlled,(+)     substance abuse  , Hepatitis -, C  Endo/Other  diabetes, Insulin Dependent  Renal/GU Renal disease     Musculoskeletal negative musculoskeletal ROS (+)   Abdominal   Peds  Hematology negative hematology ROS (+)   Anesthesia Other Findings ATRIAL FLUTTER  Reproductive/Obstetrics                           Anesthesia Physical Anesthesia Plan  ASA: IV  Anesthesia Plan: General   Post-op Pain Management:    Induction: Intravenous  PONV Risk Score and Plan: 1 and Propofol infusion and Treatment may vary due to age or medical condition  Airway Management Planned: Nasal Cannula  Additional Equipment:   Intra-op Plan:   Post-operative Plan:   Informed Consent: I have reviewed the patients History and Physical, chart, labs and discussed the procedure including the risks, benefits and alternatives for the proposed anesthesia with the patient or authorized representative who has indicated his/her understanding and acceptance.   Dental advisory given  Plan Discussed with: CRNA  Anesthesia Plan Comments:         Anesthesia Quick Evaluation

## 2018-11-04 NOTE — Procedures (Signed)
Electrical Cardioversion Procedure Note Brad [name] [id number] 01/28/1961  Procedure: Electrical Cardioversion Indications:  Atrial Flutter  Procedure Details Consent: Risks of procedure as well as the alternatives and risks of each were explained to the (patient/caregiver).  Consent for procedure obtained. Time Out: Verified patient identification, verified procedure, site/side was marked, verified correct patient position, special equipment/implants available, medications/allergies/relevent history reviewed, required imaging and test results available.  Performed  Patient placed on cardiac monitor, pulse oximetry, supplemental oxygen as necessary.  Sedation given: Per anesthesiology Pacer pads placed anterior and posterior chest.  Cardioverted 1 time(s).  Cardioverted at 150J.  Evaluation Findings: Post procedure EKG shows: NSR Complications: None Patient did tolerate procedure well.   Loralie Champagne 11/04/2018, 2:17 PM

## 2018-11-04 NOTE — CV Procedure (Signed)
Procedure: TEE  Sedation: Per anesthesiology  Indication: Atrial flutter  Findings: Please see echo section for full report.  Moderately dilated LV with mild LV hypertrophy.  EF 30-35%, diffuse hypokinesis.  Mildly dilated RV with mildly decreased systolic function.  Moderate-severe left atrial enlargement, no LA appendage thrombus.  Moderately dilated RA.  No PFO/ASD by color doppler.  Mild TR, peak RV-RA gradient 31 mmHg.  Trivial PI.  Trileaflet aortic valve with mild calcification.  Trivial aortic regurgitation, no aortic stenosis.  Mildly thickened mitral valve with two jets of mitral regurgitation.  PISA ERO 0.37 cm^2 for larger jet.  With combination of 2 jets, suspect severe mitral regurgitation.  No mitral stenosis. Flattening but not reversal of the pulmonary vein doppler pattern.  Normal caliber thoracic aorta with mild plaque.   May proceed to DCCV.   Loralie Champagne 11/04/2018 2:17 PM

## 2018-11-04 NOTE — Transfer of Care (Signed)
Immediate Anesthesia Transfer of Care Note  Patient: Brad [name]  Procedure(s) Performed: TRANSESOPHAGEAL ECHOCARDIOGRAM (TEE) (N/A ) CARDIOVERSION (N/A )  Patient Location: PACU  Anesthesia Type: MAC   Level of Consciousness: awake, alert  and patient cooperative  Airway & Oxygen Therapy: Patient Spontanous Breathing and Patient connected to nasal cannula oxygen  Post-op Assessment: Report given to RN, Post -op Vital signs reviewed and stable and Patient moving all extremities  Post vital signs: Reviewed and stable  Last Vitals:  Vitals Value Taken Time  BP 115/82 11/04/2018  2:23 PM  Temp    Pulse 87 11/04/2018  2:24 PM  Resp 16 11/04/2018  2:24 PM  SpO2 100 % 11/04/2018  2:24 PM  Vitals shown include unvalidated device data.  Last Pain:  Vitals:   11/04/18 1300  TempSrc:   PainSc: 8       Patients Stated Pain Goal: 0 (71/06/26 9485)  Complications: No apparent anesthesia complications

## 2018-11-04 NOTE — Care Management Note (Signed)
Case Management Note  Patient Details  Name: Brad [name] MRN: [id number] Date of Birth: 05/01/1961  Subjective/Objective:                    Action/Plan:  Provided patient with 30 day Eliquis card, med will be covered through [facility] benefit. PCP [place]. No other CM needs identified.  Expected Discharge Date:                  Expected Discharge Plan:  Home/Self Care  In-House Referral:     Discharge planning Services  CM Consult, Medication Assistance  Post Acute Care Choice:    Choice offered to:     DME Arranged:    DME Agency:     HH Arranged:    HH Agency:     Status of Service:  Completed, signed off  If discussed at [employer] of Stay Meetings, dates discussed:    Additional Comments:  Carles Collet, RN 11/04/2018, 10:46 AM

## 2018-11-04 NOTE — Progress Notes (Signed)
  Echocardiogram Echocardiogram Transesophageal has been performed.  Brad [name] 11/04/2018, 2:21 PM

## 2018-11-04 NOTE — Progress Notes (Signed)
   Subjective: Brad [name] was seen and evaluated at bedside. No acute events overnight. Patient is feeling better from a breathing standpoint. Denies any light headedness, orthopnea, chest pain or shortness of breath. He complains of pain from rash in his groin. Discussed plan for attempt at cardioversion today and continue with diuresis.   Objective:  Vital signs in last 24 hours: Vitals:   11/03/18 0927 11/03/18 1658 11/03/18 2120 11/04/18 0536  BP: (!) 132/96 111/75 109/73 135/89  Pulse: (!) 110 (!) 163 (!) 52 (!) 117  Resp: 19 18 18 18   Temp: 97.6 F (36.4 C) 98.3 F (36.8 C) 98 F (36.7 C) 98.5 F (36.9 C)  TempSrc: Oral Oral Oral   SpO2: 99% 99% 100% 100%  Weight:   80.5 kg   Height:       General: awake, alert lying in bed in NAD Neck: JVP elevated to mid-neck when lying down which is improved CV: tachy, irregular. SEM over apex  Pulm: normal respiratory effort; faint bibasilar crackles Ext: trace bilateral ankle edema  GU: large ulcerating rash on bilateral inner thighs; no penile discharge, lesions or skin irritation  Assessment/Plan:  Principal Problem:   Acute on chronic combined systolic and diastolic heart failure (HCC) Active Problems:   Type 2 diabetes mellitus with complication, without long-term current use of insulin (HCC)   CKD (chronic kidney disease) stage 3, GFR 30-59 ml/min (HCC)   Cocaine abuse (HCC)   NSVT (nonsustained ventricular tachycardia) ([place])   Hyponatremia   Fall  1. Acute on chronic HFrEF -due to medication non-adherence and cocaine use - repeat echo shows EF unchanged (20-25%), severe mitral regurge and moderate to severe tricuspid regurge. Severe biatrial enlargement; moderate RV dysfunction  -volume status exam improving; recorded output of 1700 yesterday. Weight unchanged -will continue Lasix 120 BID -renal function and electrolytes stable; continue tomonitor daily  - daily weights;strict I&Os - will continue low dose Coreg  once euvolemic - Started on Spironolactone 12.5 mg - continue Bidil  - greatly appreciate cardiology recommendations   2. Hyponatremia - secondary to hypervolemia in the setting of acute on chronic HFrEF - given lack of neurologic signs or symptoms, it is likely this is chronic  - Na improved to 133  3.Atrial flutter -telemetry revealsatrial flutter with sustained rates of 105-110 -going for TEE-guided DCCV today   4. HTN - blood pressure stable on Bidil  - holding Coreg in setting of above  5. DM - CBGs at goal <180 - sensitive sliding scale with meals and qhs  6. Substance abuse (EtOH and cocaine) - patient has been counseled extensively on cessation of substance use which he seems amenable to   7. Rash  - most consistent with candidal infection from excessive moisture - nystatin powder and interdry application   Dispo: Anticipated discharge in approximately 2-3 day(s).   Modena Nunnery D, DO 11/04/2018, 6:25 AM Pager: [phone]

## 2018-11-04 NOTE — Progress Notes (Addendum)
Advanced Heart Failure Rounding Note  PCP-Cardiologist: [name], MD   Subjective:    Remains on Eliquis. Still in atrial flutter. Getting TEE/DCCV today. Has gotten 5 doses of Eliquis.  Remains on 120 mg IV lasix BID with I/O -434 mls (not all collected). Creatinine improving with diuresis. 2.23 > 2.07 > 1.89. K 4.6  Received tolvaptan again yesterday. Na 133 today.   Echo 11/13: EF 20-25%, mild AI, severe MR, RV moderately down, RA and LA severely dilated, PA peak pressure 44  Feels fine. Denies SOB when up and moving. Discussed echo results and procedure for today. Has a rash in bilateral groin.  Objective:   Weight Range: 80.5 kg Body mass index is 24.75 kg/m.   Vital Signs:   Temp:  [98 F (36.7 C)-98.5 F (36.9 C)] 98.5 F (36.9 C) (11/14 0536) Pulse Rate:  [52-163] 117 (11/14 0912) Resp:  [18] 18 (11/14 0912) BP: (109-135)/(73-89) 111/74 (11/14 0912) SpO2:  [99 %-100 %] 99 % (11/14 0912) Weight:  [80.5 kg] 80.5 kg (11/13 2120) Last BM Date: 11/02/18  Weight change: Filed Weights   11/01/18 2124 11/02/18 2011 11/03/18 2120  Weight: 82.1 kg 80.5 kg 80.5 kg    Intake/Output:   Intake/Output Summary (Last 24 hours) at 11/04/2018 0949 Last data filed at 11/04/2018 0536 Gross per 24 hour  Intake 1026 ml  Output 1150 ml  Net -124 ml      Physical Exam    General:  No resp difficulty. HEENT: Normal Neck: Supple. JVP ~10. Carotids 2+ bilat; no bruits. No thyromegaly or nodule noted. Cor: PMI nondisplaced. Tachy, irregular, +s3, 3/6 HSM Lungs: CTAB, normal effort. Abdomen: Soft, non-tender, non-distended, no HSM. No bruits or masses. +BS  Extremities: No cyanosis, clubbing, or rash. R and LLE 1+ ankle edema. Red skin irritation in bilateral groin.  Neuro: Alert & orientedx3, cranial nerves grossly intact. moves all 4 extremities w/o difficulty. Affect pleasant   Telemetry   Aflutter 110s. Personally reviewed.   EKG    No new tracings.     Labs    CBC No results for input(s): WBC, NEUTROABS, HGB, HCT, MCV, PLT in the last 72 hours. Basic Metabolic Panel Recent Labs    11/02/18 0247  11/03/18 0612 11/04/18 0448  NA 119*   < > 124* 133*  K 4.5   < > 3.3* 4.6  CL 87*   < > 93* 100  CO2 24   < > 24 23  GLUCOSE 166*   < > 202* 141*  BUN 20   < > 21* 21*  CREATININE 2.29*   < > 2.07* 1.89*  CALCIUM 9.0   < > 8.6* 8.7*  MG 1.8  --  2.0  --    < > = values in this interval not displayed.   Liver Function Tests No results for input(s): AST, ALT, ALKPHOS, BILITOT, PROT, ALBUMIN in the last 72 hours. No results for input(s): LIPASE, AMYLASE in the last 72 hours. Cardiac Enzymes No results for input(s): CKTOTAL, CKMB, CKMBINDEX, TROPONINI in the last 72 hours.  BNP: BNP (last 3 results) Recent Labs    08/20/18 1410 08/26/18 0404 10/31/18 1604  BNP 757.1* 953.7* 1,446.3*    ProBNP (last 3 results) No results for input(s): PROBNP in the last 8760 hours.   D-Dimer No results for input(s): DDIMER in the last 72 hours. Hemoglobin A1C No results for input(s): HGBA1C in the last 72 hours. Fasting Lipid Panel No results for  input(s): CHOL, HDL, LDLCALC, TRIG, CHOLHDL, LDLDIRECT in the last 72 hours. Thyroid Function Tests No results for input(s): TSH, T4TOTAL, T3FREE, THYROIDAB in the last 72 hours.  Invalid input(s): FREET3  Other results:   Imaging    No results found.   Medications:     Scheduled Medications: . amitriptyline  75 mg Oral QHS  . apixaban  5 mg Oral BID  . atorvastatin  40 mg Oral Daily  . insulin aspart  0-5 Units Subcutaneous QHS  . insulin aspart  0-9 Units Subcutaneous TID WC  . isosorbide-hydrALAZINE  1 tablet Oral TID  . nicotine  21 mg Transdermal Daily  . nystatin   Topical TID  . sodium chloride flush  3 mL Intravenous Q12H  . sodium chloride flush  3 mL Intravenous Q12H  . spironolactone  12.5 mg Oral Daily    Infusions: . sodium chloride    . sodium chloride     . sodium chloride 250 mL (11/03/18 1742)  . furosemide 120 mg (11/04/18 0819)    PRN Medications: sodium chloride, acetaminophen **OR** acetaminophen, ondansetron **OR** ondansetron (ZOFRAN) IV, polyethylene glycol, sodium chloride flush, sodium chloride flush    Patient Profile   Brad [name] is a 57 y.o. male with a history of chronic systolic HF due to NICM (EF 20-25%), atrial fibrillation, cocaine and heroin use, alcohol abuse, medication noncompliance, DM, hyponatremia, frequent falls, CKD 3, hepatitis C, cirrhosis, and NSVT.   Admitted 11/10 with SOB and BLE edema.   Assessment/Plan   1. A/C systolic HF due to NICM. LHC 2017 with mild nonobstructive CAD.  - Echo 07/2018: EF 20-25%, diffuse HK, reversible restrictive pattern, grade 3 DD, trivial AI, moderate MR, LA severely dilated, RV normal function, RA mod dilated, mild TR, PA peak pressure 50 mm Hg  - Volume improving, but still elevated. - Continue 120 mg IV lasix BID. Creatinine improving with diuresis. - Increase spiro to 25 mg daily. No K supp today. (received 160 meq yesterday. K is 4.6) - Continue bidil 1 tab TID. SBP 100-130s. May be able to increase. Will see how BP is after DCCV. - Coreg held with acute exacerbation.  - Not a candidate for advanced therapies with ongoing substance abuse.   2. Atrial flutter - Probably contributing to his HF, HR 110s. - Remains in atrial flutter. On Eliquis. - EP consulted. Not a candidate for any EP procedures. Would require longterm AC due to severe LA enlargement. Recommended conservative approach.  - Will plan for TEE/DCCV today after he has had 5 doses of Eliquis. He will need to stay on [facility] for at least 1 month. Hopefully with improving Na and abstaining from ETOH and cocaine, he will not fall.   3. AKI on CKD 3 - Baseline creatinine seems to be 1.4-1.8, lots of variation. - Creatinine improving with diuresis 2.23 > 2.07 > 1.89  4. Hyponatremia - Na 112 on admit >  119 > 15 mg tolvaptan > 124 > 30 mg tolvaptan > 133.  - Restrict free water. He is on a 1.5 L fluid restriction  5. HTN - Well controlled with SBP 100-130s. Would not restart amlodipine or clonidine. Would titrate HF medications first.  - Increase spiro as above. May be able to increase bidil, but will see how BP is after DCCV.  6. Mild nonobstructive CAD on LHC 2017 - Continue ASA and statin.  - No s/s ischemia.   7. Substance abuse - Last did cocaine 1 week ago. Says  he "slipped up". Has not done heroine since 2013. - Encouraged cessation. No change.  - Smokes 1 PPD. Was apparently smoking in bathroom last night and had his cigarettes taken away. Continue nicotine patch.  - Drinks 1 40 oz beer daily  8. NSVT - Mag 2.0. K 4.6.   9. Frequent falls - Had a fall 2 days ago here in the hospital bathroom. - Continue Eliquis. No HA.   10 Mod to severe MR - Severe MR on echo yesterday. Assess with TEE today.   11. Noncompliance - Has medicaid and says he ran out of meds because he had no refills. Has difficulty with transportation. Has not used [facility] before, but has medicaid. Does not have a scale. Poor follow up.  - Will need to give him a scale and meds prior to DC.   12. Bilateral groin rash - Looks to be moisture-associated. He has been started on Nystatin.   Length of Stay: [place], NP  11/04/2018, 9:49 AM  Advanced Heart Failure Team Pager [phone] (M-F; 7a - 4p)  Please contact [facility] for night-coverage after hours (4p -7a ) and weekends on [email or web address]  Patient seen with NP, agree with the above note.   Sodium up to 133.  Says he urinated a lot but I/Os not particularly negative (not sure they are complete).  Creatinine lower at 1.8.  Today, I did TEE-guided DCCV and got him back into NSR (out of rapid atrial flutter).  TEE showed EF 30-35% with severe mitral regurgitation.    On exam, JVP 10 cm with Brad ankle edema. Irregular S1S2 with 2/6  HSM apex and S3. Clear lungs.   1. Acute on chronic systolic CHF: Nonischemic cardiomyopathy based on coronary angiography in 2017. Echo in 8/19 with EF 20-25%, moderate MR. TEE (11/19) with EF 30-35% moderate LV dilation, mild LVH, mild RV dilation/mild dysfunction, severe MR in 2 jets.  Suspect CMP is related to substance abuse =>ongoing heavy ETOH and also cocaine abuse. Atrial flutter likely worsens his symptoms. Creatinine coming down, 1.8 today.  He seems to be diuresing reasonably well but not all UOP is recorded.  Think he still has some residual volume overload.  He was cardioverted to NSR today, hopefully this will help with CHF management.  - Lasix 120 mg IV bid again today. Need to strictly record I/Os, discussed this with patient.  - Sodium up to 133 so will not repeat tolvaptan.   - Continue Bidil 1 tab tid.   - Can increase spironolactone to 25 mg daily.  - I am going to start him on Coreg 3.125 mg bid. Risk with cocaine is not high as it is also an alpha blocker. We again discussed cocaine heart risk and he says that he will stay off it.  - May need RHC, will see how diuresis goes over the next couple days.  - Also imperative to stop ETOH. We discussed this again.  - With active substance abuse, not candidate for ICD.  2. Atrial flutter: Patient appears to have been in flutter for several months. Suspect this worsens CHF. He has not been anticoagulated due to falls (likely from intoxication or hyponatremia, no syncope). He was cardioverted today back to NSR and will have to be on anticoagulation for at least a month. If he can remain abstinent from substance abuse, this should be feasible.  EP has seen, do not think he is a candidate for flutter ablation.  - Continue apixaban  5 mg bid.  3. Hyponatremia: Hypervolemic hyponatremia. Suspect due to CHF and excessive fluid intake (much of it likely ETOH prior to admission). Slow rise in Na from 112=>119 => 124 => 133. He got  tolvaptan twice.He did not have marked symptoms of hyponatremia which suggests chronicity but sodium level a couple months ago was in the 130s.  - Continue fluid restriction.  - Hold off on tolvaptan today.  4. AKI on CKD stage 3: Creatinine down to 1.8, better with diuresis.   5. Substance abuse: Cocaine, ETOH, cigarettes. I discussed the contribution of these to heart disease. He understands and is going to try to stay off when he leaves the hospital.  6. Mitral regurgitation: TEE today appeared to show severe MR (in 2 jets).  The mitral valve is thickened without frank prolapse. Mitraclip would be a consideration down the road if he is compliant with meds and followup.   Loralie Champagne 11/04/2018 3:02 PM

## 2018-11-04 NOTE — Anesthesia Postprocedure Evaluation (Signed)
Anesthesia Post Note  Patient: Brad [name]  Procedure(s) Performed: TRANSESOPHAGEAL ECHOCARDIOGRAM (TEE) (N/A ) CARDIOVERSION (N/A )     Patient location during evaluation: PACU Anesthesia Type: General Level of consciousness: awake and alert Pain management: pain level controlled Vital Signs Assessment: post-procedure vital signs reviewed and stable Respiratory status: spontaneous breathing, nonlabored ventilation, respiratory function stable and patient connected to nasal cannula oxygen Cardiovascular status: blood pressure returned to baseline and stable Postop Assessment: no apparent nausea or vomiting Anesthetic complications: no    Last Vitals:  Vitals:   11/04/18 1706 11/04/18 2121  BP: 115/86 102/67  Pulse: (!) 103 96  Resp: 18 18  Temp:  36.8 C  SpO2: 100% 100%    Last Pain:  Vitals:   11/04/18 2121  TempSrc: Oral  PainSc:                  Alazar Cherian P Hendrix Yurkovich

## 2018-11-05 DIAGNOSIS — B372 Candidiasis of skin and nail: Secondary | ICD-10-CM

## 2018-11-05 LAB — AEROBIC CULTURE W GRAM STAIN (SUPERFICIAL SPECIMEN)

## 2018-11-05 LAB — BASIC METABOLIC PANEL
Anion gap: 11 (ref 5–15)
BUN: 20 mg/dL (ref 6–20)
CO2: 25 mmol/L (ref 22–32)
Calcium: 8.7 mg/dL — ABNORMAL LOW (ref 8.9–10.3)
Chloride: 98 mmol/L (ref 98–111)
Creatinine, Ser: 1.94 mg/dL — ABNORMAL HIGH (ref 0.61–1.24)
GFR calc Af Amer: 42 mL/min — ABNORMAL LOW (ref >60)
GFR calc non Af Amer: 36 mL/min — ABNORMAL LOW (ref >60)
Glucose, Bld: 180 mg/dL — ABNORMAL HIGH (ref 70–99)
Potassium: 3.7 mmol/L (ref 3.5–5.1)
Sodium: 134 mmol/L — ABNORMAL LOW (ref 135–145)

## 2018-11-05 LAB — AEROBIC CULTURE  (SUPERFICIAL SPECIMEN)

## 2018-11-05 LAB — GLUCOSE, CAPILLARY
Glucose-Capillary: 145 mg/dL — ABNORMAL HIGH (ref 70–99)
Glucose-Capillary: 159 mg/dL — ABNORMAL HIGH (ref 70–99)
Glucose-Capillary: 269 mg/dL — ABNORMAL HIGH (ref 70–99)
Glucose-Capillary: 118 mg/dL — ABNORMAL HIGH (ref 70–99)
Glucose-Capillary: 221 mg/dL — ABNORMAL HIGH (ref 70–99)

## 2018-11-05 LAB — MAGNESIUM: Magnesium: 1.5 mg/dL — ABNORMAL LOW (ref 1.7–2.4)

## 2018-11-05 IMAGING — DX DG CHEST 2V
2 series · 2 of 2 positions shown · non-contrast
Comparison: Chest radiograph performed 10/02/2017

CLINICAL DATA: Acute onset of left-sided neck pain and generalized
chest pain. Right ankle swelling.

EXAM:
CHEST  2 VIEW

[chest lat]
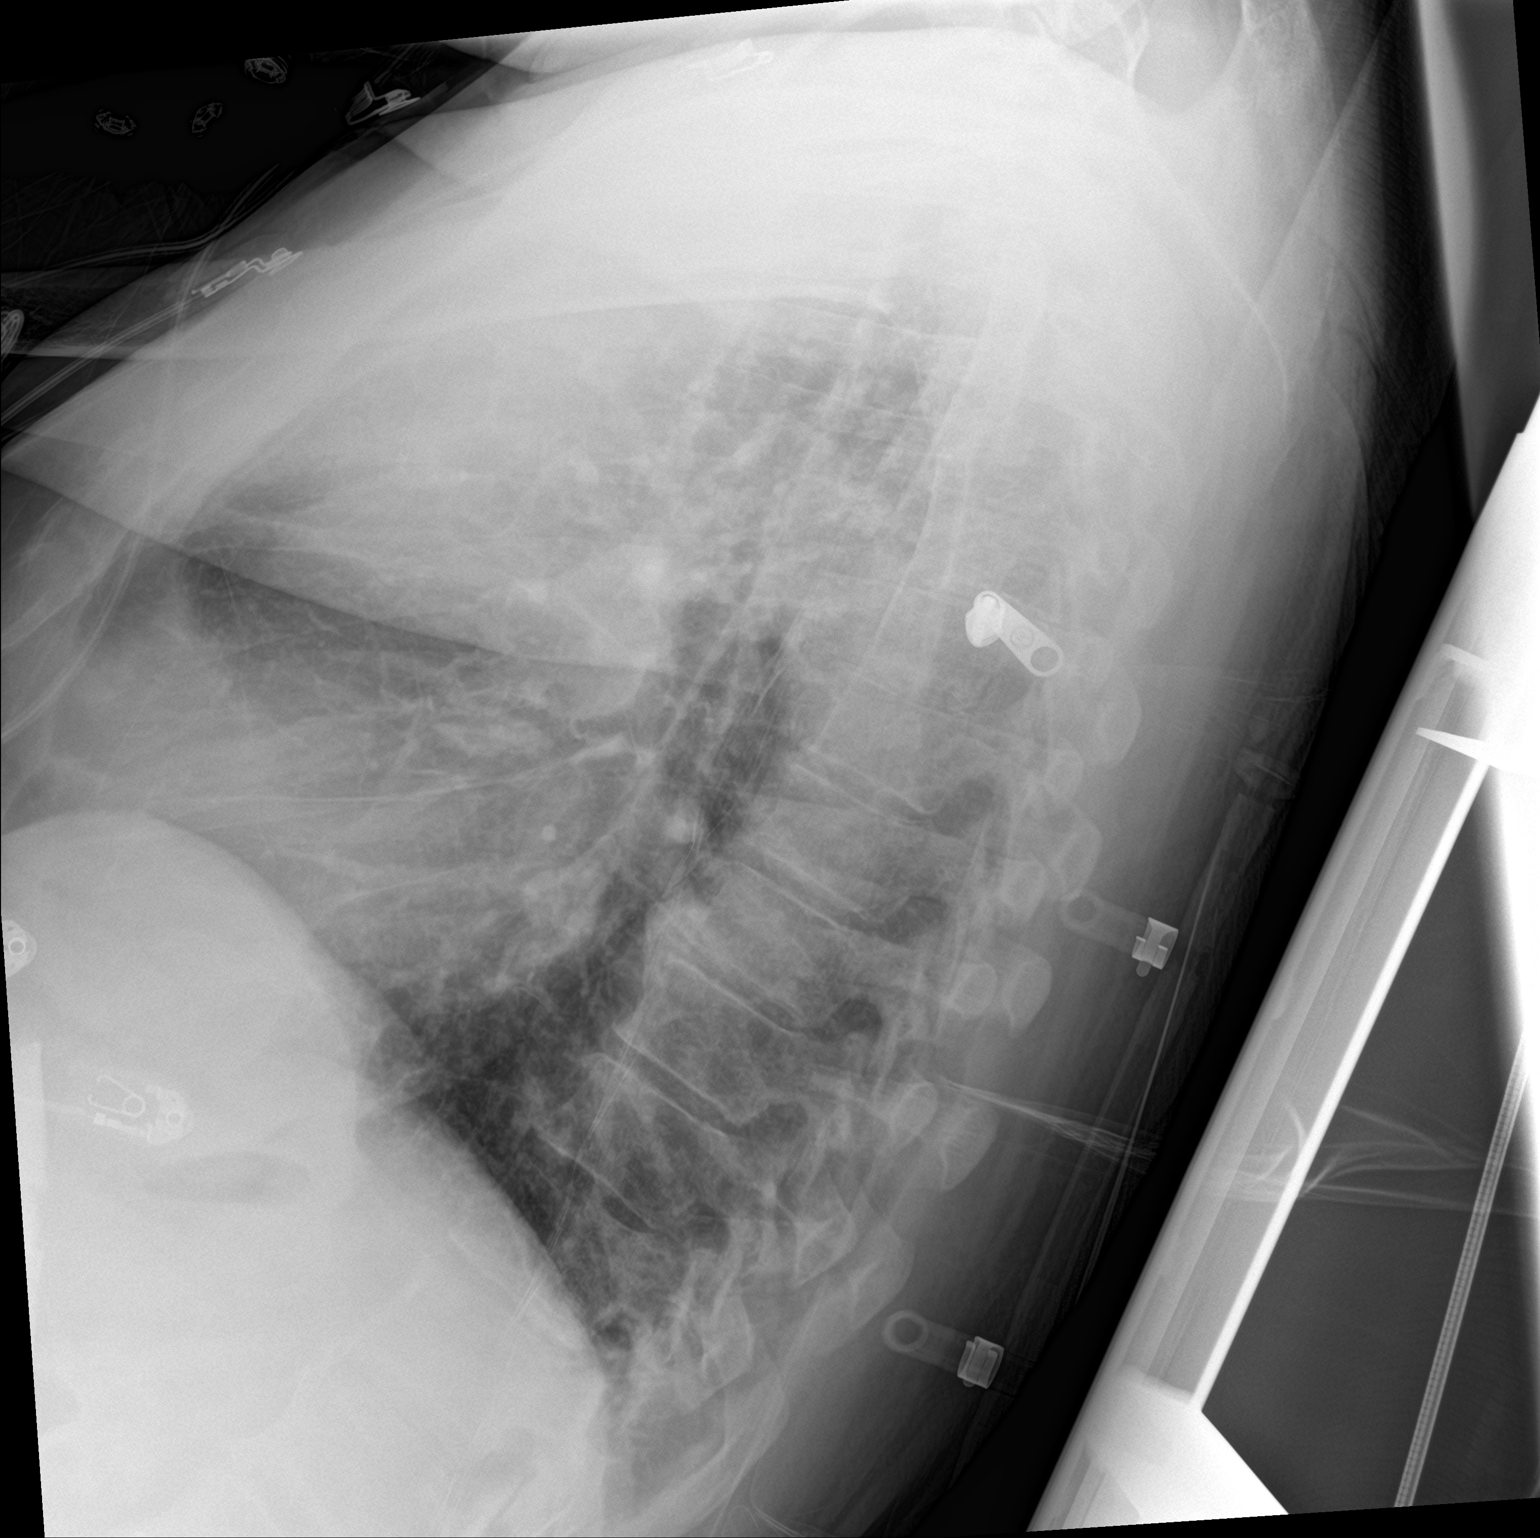

[chest ap]
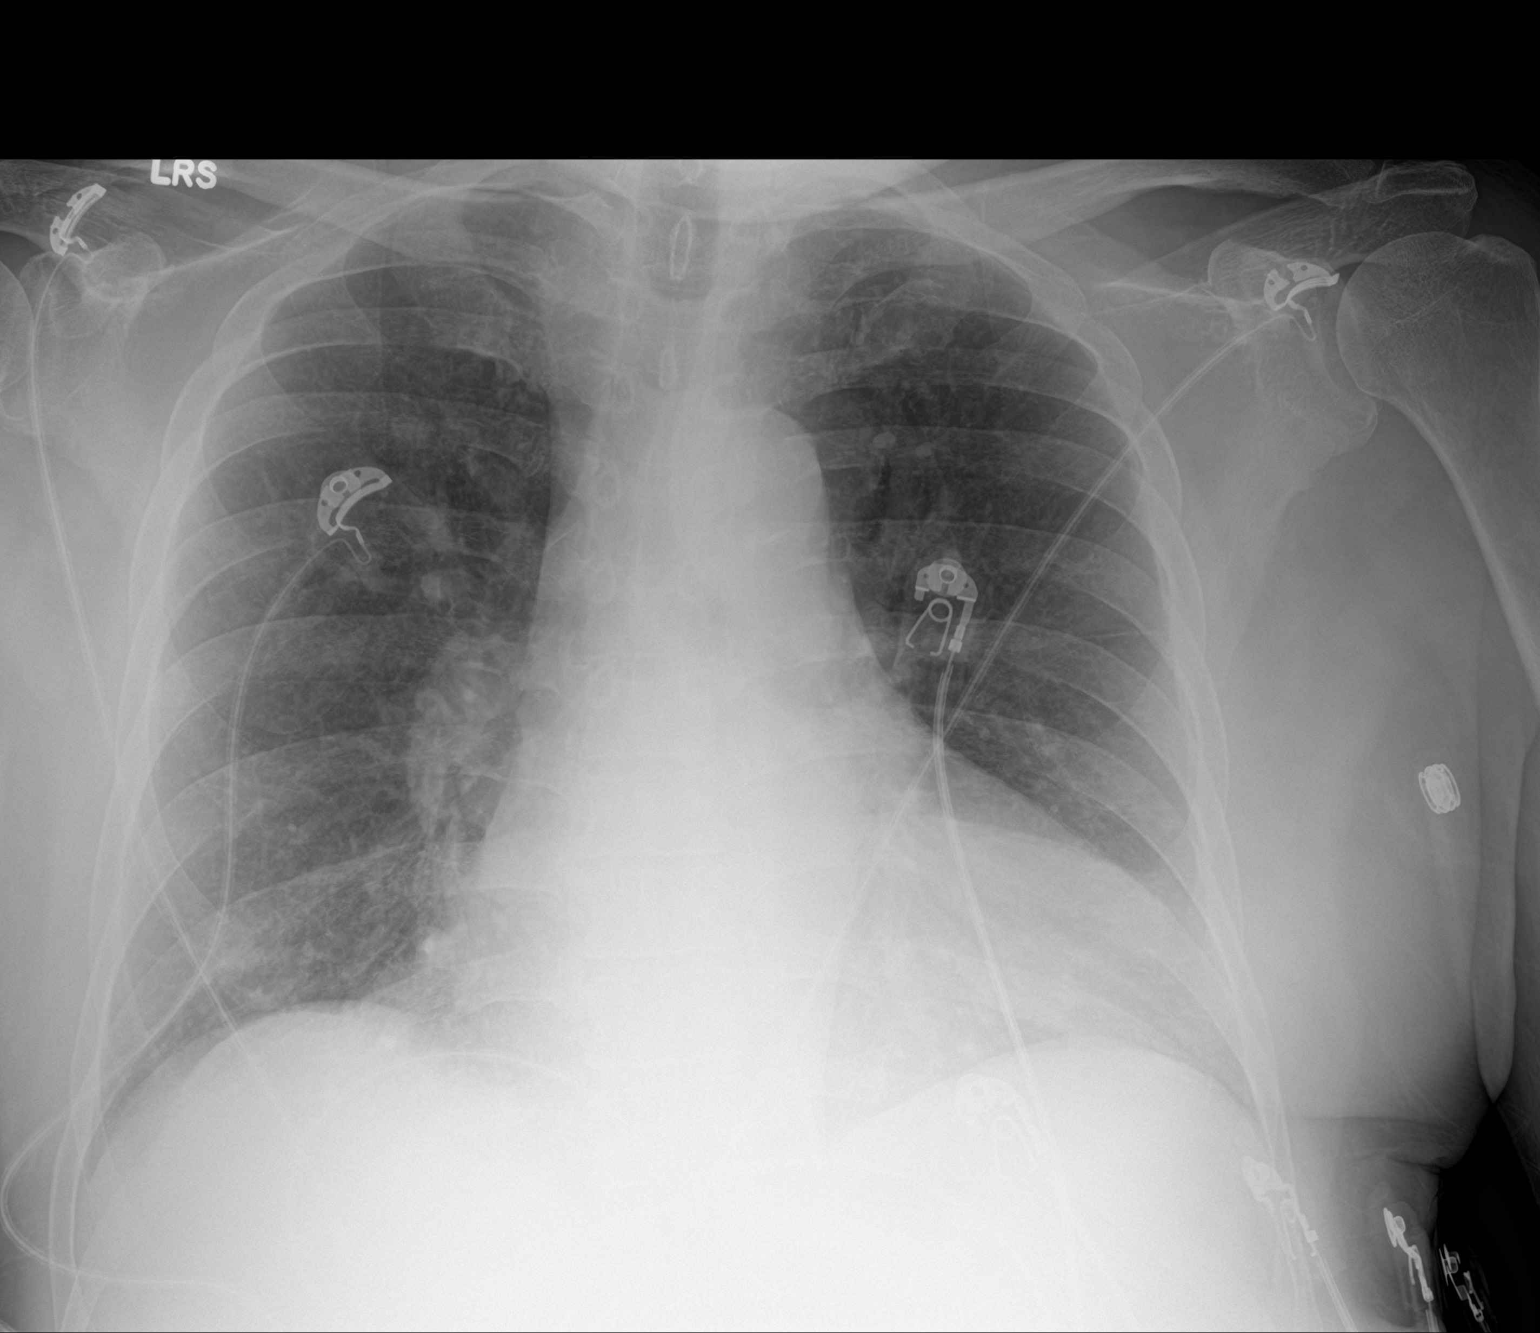

[2 of 2 positions shown; findings below may reference images not displayed]

FINDINGS: The lungs are well-aerated. Mild bibasilar opacities may reflect
atelectasis, given the lack of associated symptoms. Mild vascular
congestion is noted. There is no evidence of pleural effusion or
pneumothorax.

The heart is mildly enlarged. No acute osseous abnormalities are
seen.
IMPRESSION: Mild vascular congestion and mild cardiomegaly. Bibasilar opacities
may reflect atelectasis, given the lack of associated symptoms.

## 2018-11-05 IMAGING — CT CT ANGIO NECK
2 of 7 series · 8 of 33 positions shown · IV contrast (OMNI 350)
Comparison: CT of the cervical spine 10/02/2017.

CLINICAL DATA: New onset of severe left-sided neck pain when waking
up today. Pain is worse when looking to the left.

EXAM:
CT ANGIOGRAPHY NECK
TECHNIQUE: Multidetector CT imaging of the neck was performed using the
standard protocol during bolus administration of intravenous
contrast. Multiplanar CT image reconstructions and MIPs were
obtained to evaluate the vascular anatomy. Carotid stenosis
measurements (when applicable) are obtained utilizing NASCET
criteria, using the distal internal carotid diameter as the
denominator.
CONTRAST:  50mL X3CMLY-OBJ IOPAMIDOL (X3CMLY-OBJ) INJECTION 76%

[Series 5: cta neck · axial · 0.45mm/px · z∈[-248,-156]mm · 2 of 138 slices shown]
[im 46/138  soft-tissue]
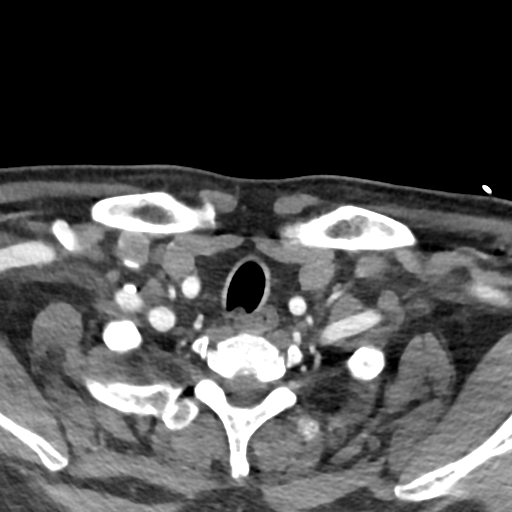
[im 92/138  soft-tissue]
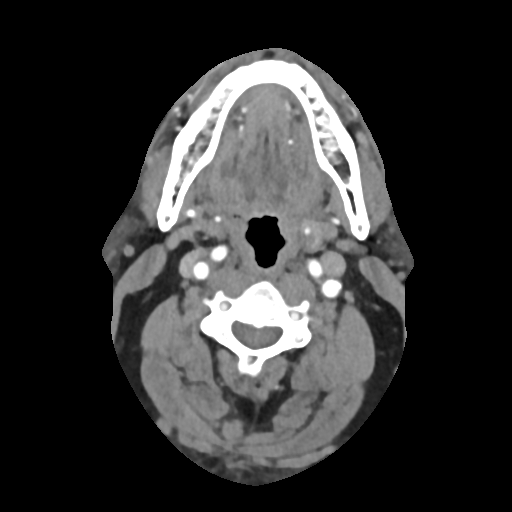

[Series 7: cta neck axial · axial · 0.39mm/px · z∈[-298,-102]mm · 6 of 276 slices shown]
[im 40/276  soft-tissue]
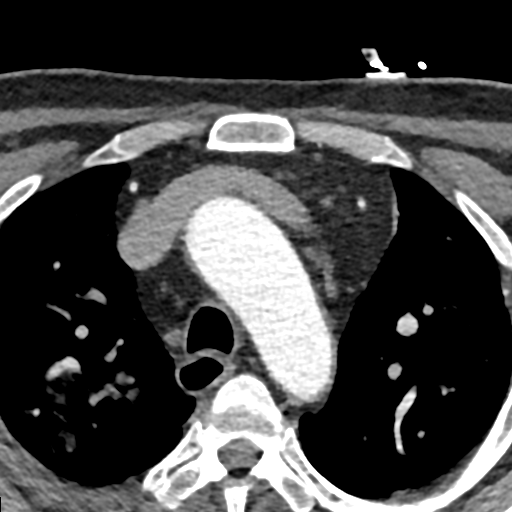
[im 79/276  bone]
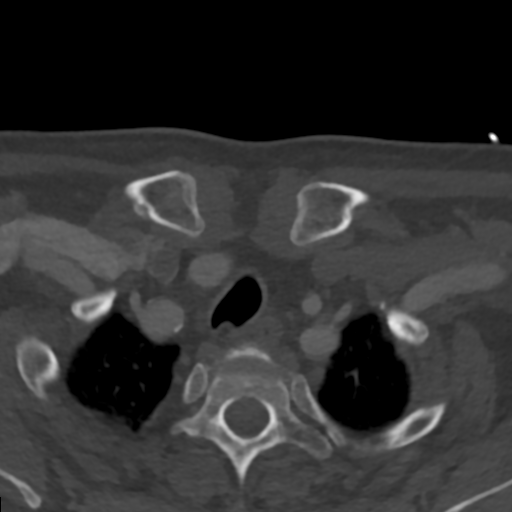
[im 118/276  soft-tissue]
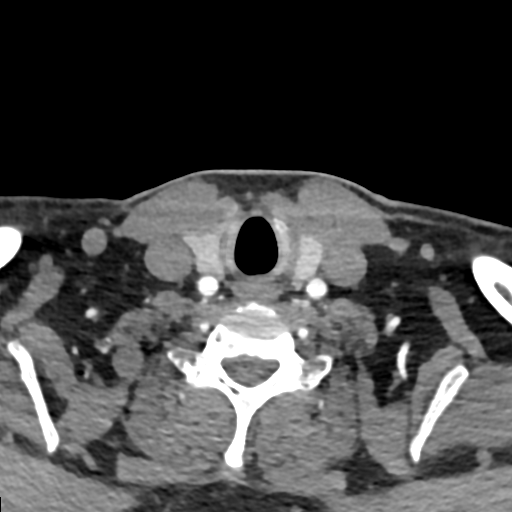
[im 158/276  bone]
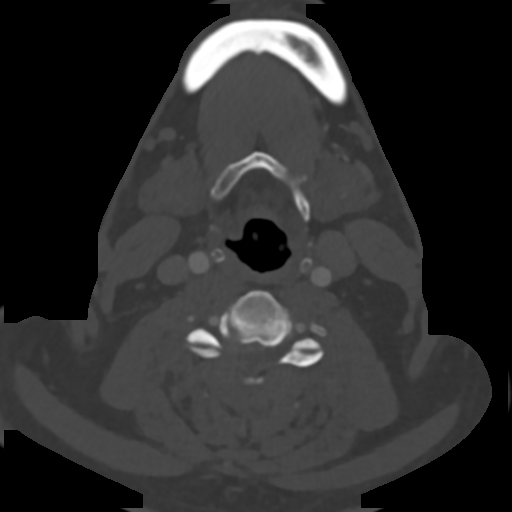
[im 197/276  soft-tissue]
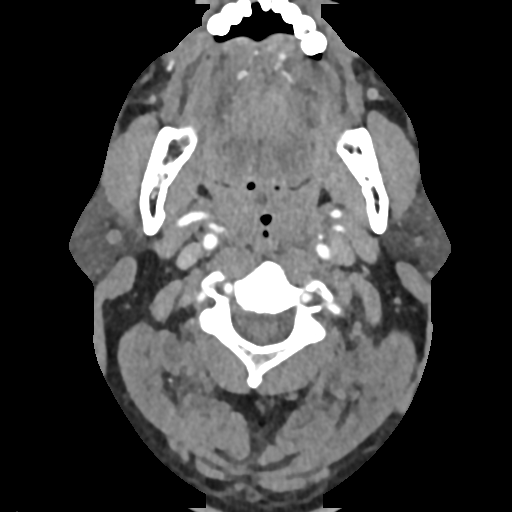
[im 236/276  bone]
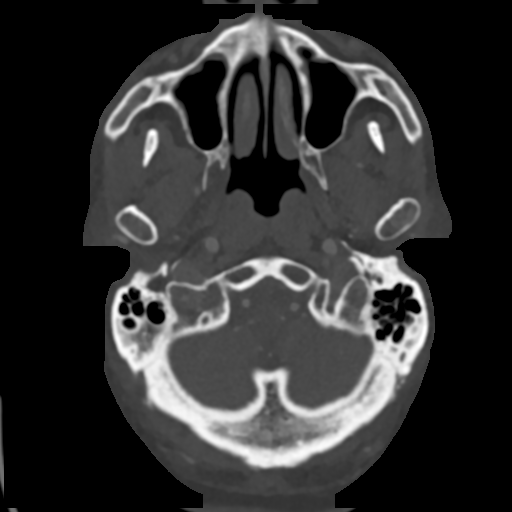

[8 of 33 positions shown; findings below may reference images not displayed]

FINDINGS: Aortic arch: A 3 vessel arch configuration is present.
Atherosclerotic calcifications are present at the aortic arch
without significant stenosis or aneurysm. There is mild irregularity
at the origins the great vessels without significant stenosis.

Right carotid system: Right common carotid artery demonstrates mild
tortuosity. There is no significant stenosis. Atherosclerotic
calcifications are present at the right carotid bifurcation without
significant stenosis. There is mild tortuosity in the mid left
cervical ICA. The left internal carotid artery is within normal
limits at the skullbase.

Left carotid system: The left common carotid artery is within normal
limits. Calcified and noncalcified plaque is present posteriorly in
the proximal left ICA without a significant stenosis relative to the
more distal vessel. The cervical left ICA is otherwise normal. The
left ICA is within normal limits at the skullbase.

Vertebral arteries: The vertebral arteries originate from the
subclavian arteries bilaterally without significant stenosis. There
is some atherosclerotic calcification at both origins. The vertebral
artery is are codominant. No focal lesions are present in the neck.
The PICA origins are visualized and normal bilaterally. The
vertebrobasilar junction is normal.

Skeleton: Mild degenerative changes of the midcervical spine are
again seen. There is no acute or healing fracture. Alignment is
anatomic. There straightening of the normal cervical lordosis.

Other neck: The soft tissues of the neck are otherwise unremarkable.
Thyroid is normal. Vocal cords are midline and symmetric. No
significant cervical adenopathy is present. The salivary glands are
within normal limits bilaterally.

Upper chest: The lung apices demonstrate patchy airspace disease in
the right upper lobe. There is mild ground-glass attenuation in both
upper lobes. Small effusions are present.
IMPRESSION: 1. No acute or focal lesion to explain the patient's left-sided neck
pain or pain with turning.
2. Atherosclerotic changes involving the aortic arch and left
greater than right carotid bifurcations without significant
stenoses.
3. Vascular tortuosity, likely related to hypertension.
4. Patchy airspace disease involving the right upper lobe. This
raises concern for atypical pneumonia. Recommend follow-up CT the
chest without contrast following appropriate therapy.
5. Ground-glass attenuation the upper lobes bilaterally likely
reflects atelectasis or edema.
These results were called by telephone at the time of interpretation
on 11/02/2017 at [DATE] to Dr. BAMBULIN TATAROVA , who verbally
acknowledged these results.

## 2018-11-05 MED ORDER — ISOSORB DINITRATE-HYDRALAZINE 20-37.5 MG PO TABS: 1.0000 | ORAL_TABLET | Freq: Three times a day (TID) | ORAL | 1 refills | Status: AC

## 2018-11-05 MED ORDER — MAGNESIUM SULFATE 4 GM/100ML IV SOLN
4.0000 g | Freq: Once | INTRAVENOUS | Status: AC
  Administered 2018-11-05: 4 g via INTRAVENOUS
  Filled 2018-11-05: qty 100

## 2018-11-05 MED ORDER — METOLAZONE 2.5 MG PO TABS
2.5000 mg | ORAL_TABLET | Freq: Once | ORAL | Status: AC
  Administered 2018-11-05: 2.5 mg via ORAL
  Filled 2018-11-05: qty 1

## 2018-11-05 MED ORDER — GABAPENTIN 600 MG PO TABS
600.0000 mg | ORAL_TABLET | Freq: Two times a day (BID) | ORAL | Status: DC
  Administered 2018-11-05 – 2018-11-08 (×7): 600 mg via ORAL
  Filled 2018-11-05 (×7): qty 1

## 2018-11-05 MED ORDER — APIXABAN 5 MG PO TABS: 5.0000 mg | ORAL_TABLET | Freq: Two times a day (BID) | ORAL | 0 refills | Status: DC

## 2018-11-05 MED ORDER — POTASSIUM CHLORIDE CRYS ER 20 MEQ PO TBCR
40.0000 meq | EXTENDED_RELEASE_TABLET | Freq: Once | ORAL | Status: AC
  Administered 2018-11-05: 40 meq via ORAL
  Filled 2018-11-05: qty 2

## 2018-11-05 MED FILL — BIDIL TABLET: 20-37.5 | 30 days supply | Qty: 90 | Fill #0

## 2018-11-05 MED FILL — ELIQUIS 5 MG TABLET: 5 | 30 days supply | Qty: 60 | Fill #0

## 2018-11-05 NOTE — Progress Notes (Signed)
   Subjective: Brad [name] was seen and evaluated at bedside morning rounds. No acute events overnight. He underwent successful DCCV yesterday and has maintained NSR. He denies lightheadedness, chest pain, shortness of breath, orthopnea. Groin pain from rash has significantly improved with Nystain powder and interdry application. Discussed plan for continued diuresis to improve volume status, but can likely go home in a couple of days.    Objective:  Vital signs in last 24 hours: Vitals:   11/05/18 0423 11/05/18 0752 11/05/18 1059 11/05/18 1213  BP: 98/65 92/62    Pulse: 95 99    Resp: 18 18    Temp: 97.8 F (36.6 C) 98.2 F (36.8 C)    TempSrc: Oral Oral    SpO2: 100% 100%  99%  Weight:   76.8 kg   Height:       General: awake, alert, lying in bed in NAD Neck: JVP to mandible CV: RRR; 3/6 SEM at apex Pulm: normal effort; lungs CTA bilaterally Ext: no pitting edema GU: healing candidal rash to bilateral inner thighs    Assessment/Plan:  Principal Problem:   Acute on chronic combined systolic and diastolic heart failure (HCC) Active Problems:   Type 2 diabetes mellitus with complication, without long-term current use of insulin (HCC)   CKD (chronic kidney disease) stage 3, GFR 30-59 ml/min (HCC)   Cocaine abuse (HCC)   NSVT (nonsustained ventricular tachycardia) ([place])   Hyponatremia   Fall  1. Acute on chronic HFrEF -due to medication non-adherence and cocaine use - repeat echo shows EF unchanged (20-25%), severe mitral regurge and moderate to severe tricuspid regurge. Severe biatrial enlargement; moderate RV dysfunction -improved volume status on exam; inaccurate I&Os and weights so difficult to track progress  -will continue Lasix 120 BID; also getting dose of metolazone 2.5 per cardiology  -magnesium down to 1.5; will replete; renal function and potassium stable; continue tomonitor daily  -resumed Coreg 3.125 mg BID  - Increased spironolactone to 25 mg -  continue Bidil - greatly appreciate cardiology recommendations  - have ordered all cardiac medications to [facility] care [facility] to ensure he is able to get them delivered to bedside in case patient is discharged over the weekend   2. Hyponatremia -secondary to hypervolemia in the setting of acute on chronic HFrEF -Na improved to 134  - continue fluid restriction   3.Atrial flutter -cardioverted back to NSR rhythm yesterday which he has maintained  - continue Eliquis for at least another month   4. HTN -blood pressure stable on Bidiland Coreg   5. DM - CBGs stable - sensitive sliding scale with meals and qhs  6. Substance abuse (EtOH and cocaine) - patient has been counseled extensively on cessation of substance usewhich he seems amenable to   7. Candidal infection  - improving with nystatin powder and interdry application   Dispo: Anticipated discharge in approximately 2 day(s).   Modena Nunnery D, DO 11/05/2018, 3:26 PM Pager: [phone]

## 2018-11-05 NOTE — Care Management Note (Signed)
Case Management Note  Patient Details  Name: Quinnlan Abruzzo MRN: [id number] Date of Birth: 01-Jan-1961  Subjective/Objective:                    Action/Plan:  Consult for : Please verify cost of Bidil and Eliquis.  Patient was already given 30 free card for Eliquis by previous case manager.   This NCM verified BiDil and Eliquis are both on [place] Medicaid Preferred list. Therefore they are covered , co pay $3 Expected Discharge Date:                  Expected Discharge Plan:  Home/Self Care  In-House Referral:     Discharge planning Services  CM Consult, Medication Assistance  Post Acute Care Choice:    Choice offered to:     DME Arranged:    DME Agency:     HH Arranged:    [place] Agency:     Status of Service:  Completed, signed off  If discussed at [employer] of Stay Meetings, dates discussed:    Additional Comments:  Marilu Favre, RN 11/05/2018, 12:16 PM

## 2018-11-05 NOTE — Progress Notes (Addendum)
Delivered a scale to patient today. Discussed importance of daily weights and when to call AHF clinic.   Primary team providing all meds through [place]. In anticipation of possible DC over the weekend, HF medication recs for DC include:  Lasix 40 mg BID Spiro 25 mg daily Eliquis 5 mg BID Atorvastatin 40 mg daily Coreg 3.125 mg BID Bidil 1 tab TID   Stop amlodipine and clonidine.   Will also arrange hospital f/u in heart failure clinic.  Georgiana Shore, NP

## 2018-11-05 NOTE — Progress Notes (Addendum)
Advanced Heart Failure Rounding Note  PCP-Cardiologist: [name], MD   Subjective:    S/p TEE/DCCV 11/14. Maintaining NSR/sinus tach 100s.  Remains on 120 mg IV lasix BID.  No UOP charted. No weight charted. Weight pending. Discussed I/O with patient and with RN.  Creatinine improving with diuresis. 2.23 > 2.07 > 1.89 > 1.89 > 1.94. K 3.7  Na stable 134. K 3.7, mag 1.5. Supp ordered.   Echo 11/13: EF 20-25%, mild AI, severe MR, RV moderately down, RA and LA severely dilated, PA peak pressure 44  TEE showed severe MR.  Feels good. Denies SOB or dizziness.   Objective:   Weight Range: 80.5 kg Body mass index is 24.75 kg/m.   Vital Signs:   Temp:  [97.6 F (36.4 C)-98.3 F (36.8 C)] 98.2 F (36.8 C) (11/15 0752) Pulse Rate:  [88-103] 99 (11/15 0752) Resp:  [14-20] 18 (11/15 0752) BP: (92-125)/(62-92) 92/62 (11/15 0752) SpO2:  [100 %] 100 % (11/15 0752) Last BM Date: 11/02/18  Weight change: Filed Weights   11/01/18 2124 11/02/18 2011 11/03/18 2120  Weight: 82.1 kg 80.5 kg 80.5 kg    Intake/Output:   Intake/Output Summary (Last 24 hours) at 11/05/2018 0955 Last data filed at 11/05/2018 0900 Gross per 24 hour  Intake 820 ml  Output 0 ml  Net 820 ml      Physical Exam    General:No resp difficulty. HEENT: Normal Neck: Supple. JVP ~10. Carotids 2+ bilat; no bruits. No thyromegaly or nodule noted. Cor: PMI nondisplaced. Slightly tachy, regular, +s3, 3/6 HSM Lungs: CTAB, normal effort. Abdomen: Soft, non-tender, non-distended, no HSM. No bruits or masses. +BS  Extremities: No cyanosis, clubbing, or rash. R and LLE no edema. Right groin erythema. Interdry in place.  Neuro: Alert & orientedx3, cranial nerves grossly intact. moves all 4 extremities w/o difficulty. Affect pleasant   Telemetry   NSR/sinus tach 90-100s.  Personally reviewed.   EKG    No new tracings.   Labs    CBC Recent Labs    11/04/18 0448  WBC 6.9  HGB 11.9*  HCT  36.2*  MCV 89.8  PLT 99*   Basic Metabolic Panel Recent Labs    11/03/18 0612 11/04/18 0448 11/05/18 0543  NA 124* 133* 134*  K 3.3* 4.6 3.7  CL 93* 100 98  CO2 24 23 25   GLUCOSE 202* 141* 180*  BUN 21* 21* 20  CREATININE 2.07* 1.89* 1.94*  CALCIUM 8.6* 8.7* 8.7*  MG 2.0  --  1.5*   Liver Function Tests No results for input(s): AST, ALT, ALKPHOS, BILITOT, PROT, ALBUMIN in the last 72 hours. No results for input(s): LIPASE, AMYLASE in the last 72 hours. Cardiac Enzymes No results for input(s): CKTOTAL, CKMB, CKMBINDEX, TROPONINI in the last 72 hours.  BNP: BNP (last 3 results) Recent Labs    08/20/18 1410 08/26/18 0404 10/31/18 1604  BNP 757.1* 953.7* 1,446.3*    ProBNP (last 3 results) No results for input(s): PROBNP in the last 8760 hours.   D-Dimer No results for input(s): DDIMER in the last 72 hours. Hemoglobin A1C No results for input(s): HGBA1C in the last 72 hours. Fasting Lipid Panel No results for input(s): CHOL, HDL, LDLCALC, TRIG, CHOLHDL, LDLDIRECT in the last 72 hours. Thyroid Function Tests No results for input(s): TSH, T4TOTAL, T3FREE, THYROIDAB in the last 72 hours.  Invalid input(s): FREET3  Other results:   Imaging    No results found.   Medications:  Scheduled Medications: . amitriptyline  75 mg Oral QHS  . apixaban  5 mg Oral BID  . atorvastatin  40 mg Oral Daily  . carvedilol  3.125 mg Oral BID WC  . insulin aspart  0-5 Units Subcutaneous QHS  . insulin aspart  0-9 Units Subcutaneous TID WC  . isosorbide-hydrALAZINE  1 tablet Oral TID  . nicotine  21 mg Transdermal Daily  . nystatin   Topical TID  . sodium chloride flush  3 mL Intravenous Q12H  . sodium chloride flush  3 mL Intravenous Q12H  . spironolactone  25 mg Oral Daily    Infusions: . sodium chloride    . sodium chloride    . sodium chloride 250 mL (11/03/18 1742)  . furosemide 120 mg (11/04/18 1728)  . magnesium sulfate 1 - 4 g bolus IVPB      PRN  Medications: sodium chloride, acetaminophen **OR** acetaminophen, ondansetron **OR** ondansetron (ZOFRAN) IV, polyethylene glycol, sodium chloride flush, sodium chloride flush    Patient Profile   Brad [name] is a 57 y.o. male with a history of chronic systolic HF due to NICM (EF 20-25%), atrial fibrillation, cocaine and heroin use, alcohol abuse, medication noncompliance, DM, hyponatremia, frequent falls, CKD 3, hepatitis C, cirrhosis, and NSVT.   Admitted 11/10 with SOB and BLE edema.   Assessment/Plan   1. A/C systolic HF due to NICM. LHC 2017 with mild nonobstructive CAD.  - Echo 07/2018: EF 20-25%, diffuse HK, reversible restrictive pattern, grade 3 DD, trivial AI, moderate MR, LA severely dilated, RV normal function, RA mod dilated, mild TR, PA peak pressure 50 mm Hg  - Volume remains elevated. - Continue 120 mg IV lasix BID. May need to give metolazone.  - Continue spiro 25 mg daily. - Continue coreg 3.125 mg BID. - Continue bidil 1 tab TID.  - Not a candidate for advanced therapies with ongoing substance abuse.   2. Atrial flutter - Probably contributing to his HF, HR 110s. - EP consulted. Not a candidate for any EP procedures. Would require longterm AC due to severe LA enlargement. Recommended conservative approach.  - S/p TEE/DCCV 11/14. Maintaining NSR. Continue eliquis 5 mg BID for at least 1 month.   3. AKI on CKD 3 - Baseline creatinine seems to be 1.4-1.8, lots of variation. - Creatinine improving with diuresis 2.23 > 2.07 > 1.89 > 1.94  4. Hyponatremia - Na 112 on admit > 119 > 15 mg tolvaptan > 124 > 30 mg tolvaptan > 133 > 134.  - Restrict free water. He is on a 1.5 L fluid restriction. Continue  5. HTN - Well controlled with SBP 100-130s. Would not restart amlodipine or clonidine. Would titrate HF medications first.  - SBP 90s. No changes today.   6. Mild nonobstructive CAD on LHC 2017 - Continue ASA and statin.  - No s/s ischemia.    7.  Substance abuse - Last did cocaine 1 week ago. Says he "slipped up". Has not done heroine since 2013. - Encouraged cessation. No change.  - Smokes 1 PPD. Was apparently smoking in bathroom last night and had his cigarettes taken away. Continue nicotine patch.  - Drinks 1 40 oz beer daily  8. NSVT - Mag 1.5. Supp ordered. K 3.7  9. Frequent falls - Had a fall several days ago here in the hospital bathroom. - Continue Eliquis. No HA.   10 Severe MR - TEE 11/14 appeared to show severe MR (in 2 jets).  The mitral  valve is thickened without frank prolapse. Mitraclip would be a consideration down the road if he is compliant with meds and followup.   11. Noncompliance - Has medicaid and says he ran out of meds because he had no refills. Has difficulty with transportation. Has not used [facility] before, but has medicaid.  - Discussed paramedicine. He is interested. Will refer to HF paramedicine. - Will need to give him a scale and meds prior to DC. Will discuss with MD and see if he may go over weekend. If so, will try to get meds to [facility] pharmacy today. They can hold for him until discharge.  12. Bilateral groin rash - Looks to be moisture-associated. He has been started on Nystatin. Per primary.   Length of Stay: [place], NP  11/05/2018, 9:55 AM  Advanced Heart Failure Team Pager [phone] (M-F; 7a - 4p)  Please contact [facility] for night-coverage after hours (4p -7a ) and weekends on [email or web address]  Patient seen with PA, agree with the above note.   TEE-DCCV yesterday, remains in NSR.  Feeling better every day.  I/Os not recorded and no weights so difficult to tell how diuresis is going.  Creatinine is stable.   On exam, JVP still elevated 14+ cm.  Regular S1S2 with 3/6 HSM apex. No edema.   1. Acute on chronic systolic CHF: Nonischemic cardiomyopathy based on coronary angiography in 2017. Echo in 8/19 with EF 20-25%, moderate MR. TEE (11/19) with EF 30-35% moderate LV  dilation, mild LVH, mild RV dilation/mild dysfunction, severe MR in 2 jets.  Suspect CMP is related to substance abuse =>ongoing heavy ETOH and also cocaine abuse. Atrial flutter likely worsened his symptoms. Creatinine stable at 1.9 today.  He seems to be diuresing reasonably well but UOP is not being recorded.  He remains in NSR after cardioversion.  Still volume overloaded on exam.  - Lasix 120 mg IV bidagain today, will give a dose of metolazone 2.5 x 1 now that sodium is back up to 134.   - Needs I/Os recorded and weights daily as ordered.  Will discuss with nursing again.   -ContinueBidil 1 tab tid.   - Continue spironolactone 25 mg daily. - Continue Coreg 3.125 mg bid. Risk with cocaine is not high as it is also an alpha blocker. Weagain discussedcocaine heart risk and he says that he will stay off it.  - May need RHC, will see how diuresis goes over the next couple days.  - Also imperative to stop ETOH. We discussed thisagain.  - With active substance abuse,notcandidate for ICD.  2. Atrial flutter: Patient appears to have been in flutter for several months. Suspect this worsens CHF. He has not been anticoagulated due to falls (likely from intoxication or hyponatremia, no syncope). He was cardioverted back to NSR and will have to be on anticoagulation for at least a month. If he can remain abstinent from substance abuse, this shouldbe feasible. EP has seen, do not think he is a candidate for flutter ablation. -Continueapixaban 5 mg bid.  3. Hyponatremia: Hypervolemic hyponatremia. Suspect due to CHF and excessive fluid intake (much of it likely ETOH prior to admission). Slow rise in Na from 112=>119=> 124 => 133 => 134. He got tolvaptan twice.He did not have marked symptoms of hyponatremia which suggests chronicity but sodium level a couple months ago was in the 130s.  - Continue fluid restriction.  - Hold off on tolvaptan today.  4. AKI on CKD stage 3:Creatinine  stable at 1.9, better with diuresis. 5. Substance abuse: Cocaine, ETOH, cigarettes.I discussed thecontribution of these to heart disease. He understands and is going to try to stay off when he leaves the hospital. 6. Mitral regurgitation: TEE today appeared to show severe MR (in 2 jets).  The mitral valve is thickened without frank prolapse. Mitraclip would be a consideration down the road if he is compliant with meds and followup.   Loralie Champagne 11/05/2018 11:00 AM

## 2018-11-05 NOTE — Progress Notes (Signed)
Pharmacy Counseling Note  Counseled Brad [name] this afternoon on Eliquis. I spoke about proper administration and what to do about missed doses. I also counseled him on signs and symptoms of bleeding and to immediately come to the hospital if he was to have a fall while on Eliquis. Additionally, I instructed him to let either his physician or pharmacist know if he started any new medications so they could check for drug interactions. Brad [name] verbalized understanding.  [facility] pharmacy was to fill all his discharge meds, but only Eliquis and Bidil were sent and filled. He is out of medication at home. His usual home pharmacy, [place], closes at 1 pm on Saturdays and is closed on Sundays. He uses this pharmacy since he has a $0 co-pay there. When I asked him whether he thought he could get to another pharmacy by his home (he uses the bus system), he says that there is a [facility] by his home, but he would have a co-pay and he does not have the means to cover that right now. HF team made aware.  Brad [name], PharmD PGY1 Pharmacy Resident Phone [phone] 11/05/2018     5:38 PM
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
## 2018-11-06 DIAGNOSIS — B379 Candidiasis, unspecified: Secondary | ICD-10-CM

## 2018-11-06 DIAGNOSIS — I071 Rheumatic tricuspid insufficiency: Secondary | ICD-10-CM

## 2018-11-06 DIAGNOSIS — I110 Hypertensive heart disease with heart failure: Secondary | ICD-10-CM

## 2018-11-06 DIAGNOSIS — I440 Atrioventricular block, first degree: Secondary | ICD-10-CM

## 2018-11-06 DIAGNOSIS — E119 Type 2 diabetes mellitus without complications: Secondary | ICD-10-CM

## 2018-11-06 DIAGNOSIS — I50810 Right heart failure, unspecified: Secondary | ICD-10-CM

## 2018-11-06 LAB — BASIC METABOLIC PANEL
Anion gap: 9 (ref 5–15)
BUN: 22 mg/dL — ABNORMAL HIGH (ref 6–20)
CO2: 27 mmol/L (ref 22–32)
Calcium: 8.7 mg/dL — ABNORMAL LOW (ref 8.9–10.3)
Chloride: 95 mmol/L — ABNORMAL LOW (ref 98–111)
Creatinine, Ser: 2.09 mg/dL — ABNORMAL HIGH (ref 0.61–1.24)
GFR calc Af Amer: 39 mL/min — ABNORMAL LOW (ref >60)
GFR calc non Af Amer: 33 mL/min — ABNORMAL LOW (ref >60)
Glucose, Bld: 222 mg/dL — ABNORMAL HIGH (ref 70–99)
Potassium: 3.5 mmol/L (ref 3.5–5.1)
Sodium: 131 mmol/L — ABNORMAL LOW (ref 135–145)

## 2018-11-06 LAB — HERPES SIMPLEX VIRUS(HSV) DNA BY PCR

## 2018-11-06 LAB — GLUCOSE, CAPILLARY
Glucose-Capillary: 198 mg/dL — ABNORMAL HIGH (ref 70–99)
Glucose-Capillary: 125 mg/dL — ABNORMAL HIGH (ref 70–99)
Glucose-Capillary: 148 mg/dL — ABNORMAL HIGH (ref 70–99)
Glucose-Capillary: 160 mg/dL — ABNORMAL HIGH (ref 70–99)

## 2018-11-06 LAB — MAGNESIUM: Magnesium: 2 mg/dL (ref 1.7–2.4)

## 2018-11-06 NOTE — Progress Notes (Signed)
   Subjective: Patient doing well this AM. Continues to have good urine output. He is worried that he is loosing too much weight. Concerned that he will not be able to leave this weekend as he would not be able to get his medications. He worries that he would have to come back if he were unable to get his medications. Discussed that we will not discharge him until we have formulated a plan with the heart failure team and gotten him his medications. He voices understanding. All questions and concerns addressed.   Objective: Vital signs in last 24 hours: Vitals:   11/05/18 2203 11/05/18 2300 11/06/18 0352 11/06/18 0920  BP: 97/67  121/79 (!) 126/95  Pulse: 83  85 94  Resp: 16  18 18   Temp: 97.9 F (36.6 C)  97.7 F (36.5 C) 98.3 F (36.8 C)  TempSrc: Oral  Oral Oral  SpO2: 99%  100% 100%  Weight:  75.4 kg    Height:       General: Well nourished male in no acute distress Pulm: Good air movement with no wheezing or crackles  CV: RRR, no murmurs, no rubs  Abdomen: Soft, non-distended, no tenderness to palpation  Extremities: Mild pitting LE edema   Assessment/Plan:  Brad [name] is a 57 y.o male with polysubstance use disorder and HFrEF (nonischemic) who presented to the ED with volume overload after running out of his lasix and using cocaine. He was found to be hyponatremic and subsequently admitted for further evaluation.   1. Acute on chronic HFrEF -Due to medication non-adherence and cocaine use -Improved volume status on exam. I&O's inaccurate. Weight down ~7 kg since admission.  -Continue Lasix 120 BID -Renal function, potassium, and mag stable  - Telemetry reviewed: NSR with frequent PVC -Continue Coreg 3.125 mg BID, Spironolactone to 25 mg, and Bidil20-37.5 mg TID - Greatly appreciate cardiology recommendations  - Will arrange meds to bed when stable for discharge  2. Hyponatremia -Secondary to hypervolemia in the setting of acute on chronic HFrEF -Na  improved to 131  - Continue fluid restriction   3.Atrial flutter -S/p DCCV on 11/14  - Tele with NSR - Continue Eliquis for at least another month   4. HTN -Blood pressure stable on Bidiland Coreg   5. DM - CBGs stable - sensitive sliding scale with meals and qhs  6. Substance abuse (EtOH and cocaine) - Patient has been counseled extensively on cessation of substance usewhich he seems amenable to   7. Candidal infection  - Improving with nystatin powder and interdry application  Dispo: Anticipated discharge in approximately 1-2 day(s) pending improvement in volume status and arrangement of his outpatient medications.   Brad Homes, MD 11/06/2018, 11:21 AM Pager: My Pager: [phone]

## 2018-11-06 NOTE — Progress Notes (Signed)
PCP-Cardiologist: [name], MD   Subjective:    Feels well less dyspnea   Objective:   Weight Range: 75.4 kg Body mass index is 23.19 kg/m.   Vital Signs:   Temp:  [97.7 F (36.5 C)-98.3 F (36.8 C)] 98.3 F (36.8 C) (11/16 0920) Pulse Rate:  [83-94] 94 (11/16 0920) Resp:  [16-18] 18 (11/16 0920) BP: (97-126)/(67-95) 126/95 (11/16 0920) SpO2:  [99 %-100 %] 100 % (11/16 0920) Weight:  [75.4 kg-76.8 kg] 75.4 kg (11/15 2300) Last BM Date: 11/02/18  Weight change: Filed Weights   11/03/18 2120 11/05/18 1059 11/05/18 2300  Weight: 80.5 kg 76.8 kg 75.4 kg    Intake/Output:   Intake/Output Summary (Last 24 hours) at 11/06/2018 0945 Last data filed at 11/06/2018 0300 Gross per 24 hour  Intake 1332.58 ml  Output 1850 ml  Net -517.42 ml      Physical Exam    Affect appropriate Chronically ill black male   HEENT: normal Neck supple with no adenopathy JVP normal no bruits no thyromegaly Lungs clear with no wheezing and good diaphragmatic motion Heart:  S1/S2 MR murmur, no rub, gallop or click PMI normal Abdomen: benighn, BS positve, no tenderness, no AAA no bruit.  No HSM or HJR Distal pulses intact with no bruits No edema Neuro non-focal Skin warm and dry No muscular weakness   Telemetry   NSR/sinus tach 90-100s.  Personally reviewed.   EKG    No new tracings.   Labs    CBC Recent Labs    11/04/18 0448  WBC 6.9  HGB 11.9*  HCT 36.2*  MCV 89.8  PLT 99*   Basic Metabolic Panel Recent Labs    11/05/18 0543 11/06/18 0605  NA 134* 131*  K 3.7 3.5  CL 98 95*  CO2 25 27  GLUCOSE 180* 222*  BUN 20 22*  CREATININE 1.94* 2.09*  CALCIUM 8.7* 8.7*  MG 1.5* 2.0   Liver Function Tests No results for input(s): AST, ALT, ALKPHOS, BILITOT, PROT, ALBUMIN in the last 72 hours. No results for input(s): LIPASE, AMYLASE in the last 72 hours. Cardiac Enzymes No results for input(s): CKTOTAL, CKMB, CKMBINDEX, TROPONINI in the last 72  hours.  BNP: BNP (last 3 results) Recent Labs    08/20/18 1410 08/26/18 0404 10/31/18 1604  BNP 757.1* 953.7* 1,446.3*     Other results:  TEE/DCC:  EF 30-35% severe MR   Imaging    No results found.   Medications:     Scheduled Medications: . amitriptyline  75 mg Oral QHS  . apixaban  5 mg Oral BID  . atorvastatin  40 mg Oral Daily  . carvedilol  3.125 mg Oral BID WC  . gabapentin  600 mg Oral BID  . insulin aspart  0-5 Units Subcutaneous QHS  . insulin aspart  0-9 Units Subcutaneous TID WC  . isosorbide-hydrALAZINE  1 tablet Oral TID  . nicotine  21 mg Transdermal Daily  . nystatin   Topical TID  . spironolactone  25 mg Oral Daily    Infusions: . furosemide 120 mg (11/06/18 0900)    PRN Medications: acetaminophen **OR** acetaminophen, ondansetron **OR** ondansetron (ZOFRAN) IV, polyethylene glycol    Patient Profile   Brad [name] is a 57 y.o. male with a history of chronic systolic HF due to NICM (EF 20-25%), atrial fibrillation, cocaine and heroin use, alcohol abuse, medication noncompliance, DM, hyponatremia, frequent falls, CKD 3, hepatitis C, cirrhosis, and NSVT.   Admitted 11/10 with SOB  and BLE edema.   Assessment/Plan   1. A/C systolic HF due to NICM. LHC 2017 with mild nonobstructive CAD.  - EF 30-35% continue high dose bid iv lasix over weekend   2. Atrial flutter - post TEE / [facility] with NSR rates 90 continue eliquis   3. AKI on CKD 3 - near baseline 2.0 today follow and cut back diuresis as needed   4. Hyponatremia - improved to 131 with diuresis hypervolemic   5. HTN - Well controlled.  Continue current medications and low sodium Dash type diet.    6. Mild nonobstructive CAD on LHC 2017 - Continue ASA and statin.   7. Substance abuse - Last did cocaine 1 week ago. Says he "slipped up". Has not done heroine since 2013. - Encouraged cessation. No change.  - Smokes 1 PPD. Was apparently smoking in bathroom last night  and had his cigarettes taken away. Continue nicotine patch.  - Drinks 1 40 oz beer daily  8 Severe MR - TEE 11/14 appeared to show severe MR (in 2 jets).  The mitral valve is thickened without frank prolapse. Mitraclip would be a consideration down the road if he is compliant with meds and followup.    Jenkins Rouge, MD  11/06/2018, 9:45 AM

## 2018-11-07 DIAGNOSIS — I340 Nonrheumatic mitral (valve) insufficiency: Secondary | ICD-10-CM

## 2018-11-07 LAB — BASIC METABOLIC PANEL
Anion gap: 13 (ref 5–15)
BUN: 30 mg/dL — ABNORMAL HIGH (ref 6–20)
CO2: 28 mmol/L (ref 22–32)
Calcium: 8.8 mg/dL — ABNORMAL LOW (ref 8.9–10.3)
Chloride: 88 mmol/L — ABNORMAL LOW (ref 98–111)
Creatinine, Ser: 2.34 mg/dL — ABNORMAL HIGH (ref 0.61–1.24)
GFR calc Af Amer: 34 mL/min — ABNORMAL LOW (ref >60)
GFR calc non Af Amer: 29 mL/min — ABNORMAL LOW (ref >60)
Glucose, Bld: 145 mg/dL — ABNORMAL HIGH (ref 70–99)
Potassium: 3.3 mmol/L — ABNORMAL LOW (ref 3.5–5.1)
Sodium: 129 mmol/L — ABNORMAL LOW (ref 135–145)

## 2018-11-07 LAB — GLUCOSE, CAPILLARY
Glucose-Capillary: 190 mg/dL — ABNORMAL HIGH (ref 70–99)
Glucose-Capillary: 266 mg/dL — ABNORMAL HIGH (ref 70–99)
Glucose-Capillary: 176 mg/dL — ABNORMAL HIGH (ref 70–99)
Glucose-Capillary: 221 mg/dL — ABNORMAL HIGH (ref 70–99)

## 2018-11-07 LAB — MAGNESIUM: Magnesium: 1.6 mg/dL — ABNORMAL LOW (ref 1.7–2.4)

## 2018-11-07 MED ORDER — CARVEDILOL 6.25 MG PO TABS
6.2500 mg | ORAL_TABLET | Freq: Two times a day (BID) | ORAL | Status: DC
  Administered 2018-11-07 – 2018-11-08 (×2): 6.25 mg via ORAL
  Filled 2018-11-07 (×2): qty 1

## 2018-11-07 MED ORDER — FUROSEMIDE 80 MG PO TABS: 120.0000 mg | ORAL_TABLET | Freq: Two times a day (BID) | ORAL | Status: DC

## 2018-11-07 MED ORDER — POTASSIUM CHLORIDE CRYS ER 20 MEQ PO TBCR
40.0000 meq | EXTENDED_RELEASE_TABLET | Freq: Two times a day (BID) | ORAL | Status: DC
  Administered 2018-11-07 – 2018-11-08 (×3): 40 meq via ORAL
  Filled 2018-11-07 (×3): qty 2

## 2018-11-07 MED ORDER — MAGNESIUM SULFATE 4 GM/100ML IV SOLN
4.0000 g | Freq: Once | INTRAVENOUS | Status: AC
  Administered 2018-11-07: 4 g via INTRAVENOUS
  Filled 2018-11-07: qty 100

## 2018-11-07 MED ORDER — FUROSEMIDE 40 MG PO TABS
60.0000 mg | ORAL_TABLET | Freq: Two times a day (BID) | ORAL | Status: DC
  Administered 2018-11-07 – 2018-11-08 (×2): 60 mg via ORAL
  Filled 2018-11-07 (×2): qty 1

## 2018-11-07 NOTE — Progress Notes (Signed)
PCP-Cardiologist: [name], MD   Subjective:    No dyspnea has ambulated in halls. Enjoyed dscussing his cooking [place] [employer] [place]  Objective:   Weight Range: 75.4 kg Body mass index is 23.19 kg/m.   Vital Signs:   Temp:  [97.4 F (36.3 C)-98.5 F (36.9 C)] 97.4 F (36.3 C) (11/17 0935) Pulse Rate:  [85-92] 85 (11/17 0935) Resp:  [18] 18 (11/17 0935) BP: (110-115)/(75-95) 114/92 (11/17 0935) SpO2:  [99 %-100 %] 99 % (11/17 0935) Weight:  [75.4 kg] 75.4 kg (11/16 2056) Last BM Date: 11/06/18(per pt)  Weight change: Filed Weights   11/05/18 1059 11/05/18 2300 11/06/18 2056  Weight: 76.8 kg 75.4 kg 75.4 kg    Intake/Output:   Intake/Output Summary (Last 24 hours) at 11/07/2018 1234 Last data filed at 11/07/2018 1132 Gross per 24 hour  Intake 1574.19 ml  Output 3900 ml  Net -2325.81 ml      Physical Exam    Affect appropriate Chronically ill black male   HEENT: normal Neck supple with no adenopathy JVP normal no bruits no thyromegaly Lungs clear with no wheezing and good diaphragmatic motion Heart:  S1/S2 MR murmur, no rub, gallop or click PMI normal Abdomen: benighn, BS positve, no tenderness, no AAA no bruit.  No HSM or HJR Distal pulses intact with no bruits No edema Neuro non-focal Skin warm and dry No muscular weakness   Telemetry   NSR/sinus tach 90-100s.  Personally reviewed.   EKG    SR rate 92 PVC poor R wave progression   Labs    CBC No results for input(s): WBC, NEUTROABS, HGB, HCT, MCV, PLT in the last 72 hours. Basic Metabolic Panel Recent Labs    11/06/18 0605 11/07/18 0450  NA 131* 129*  K 3.5 3.3*  CL 95* 88*  CO2 27 28  GLUCOSE 222* 145*  BUN 22* 30*  CREATININE 2.09* 2.34*  CALCIUM 8.7* 8.8*  MG 2.0 1.6*    BNP: BNP (last 3 results) Recent Labs    08/20/18 1410 08/26/18 0404 10/31/18 1604  BNP 757.1* 953.7* 1,446.3*     Other results:  TEE/DCC:  EF 30-35% severe MR   Imaging    No  results found.   Medications:     Scheduled Medications: . amitriptyline  75 mg Oral QHS  . apixaban  5 mg Oral BID  . atorvastatin  40 mg Oral Daily  . carvedilol  3.125 mg Oral BID WC  . furosemide  120 mg Oral BID  . gabapentin  600 mg Oral BID  . insulin aspart  0-5 Units Subcutaneous QHS  . insulin aspart  0-9 Units Subcutaneous TID WC  . isosorbide-hydrALAZINE  1 tablet Oral TID  . nicotine  21 mg Transdermal Daily  . nystatin   Topical TID  . potassium chloride  40 mEq Oral BID  . spironolactone  25 mg Oral Daily    Infusions:   PRN Medications: acetaminophen **OR** acetaminophen, ondansetron **OR** ondansetron (ZOFRAN) IV, polyethylene glycol    Patient Profile   Brad [name] is a 57 y.o. male with a history of chronic systolic HF due to NICM (EF 20-25%), atrial fibrillation, cocaine and heroin use, alcohol abuse, medication noncompliance, DM, hyponatremia, frequent falls, CKD 3, hepatitis C, cirrhosis, and NSVT.   Admitted 11/10 with SOB and BLE edema.   Assessment/Plan   1. A/C systolic HF due to NICM. LHC 2017 with mild nonobstructive CAD.  - EF 30-35% change to lower dose  oral lasix starting to get azotemic symptoms improved   2. Atrial flutter - post TEE / [facility] with NSR rates 90 continue eliquis   3. AKI on CKD 3 - Cr 2.3 today decreasing lasix   4. Hyponatremia - improved to 129  with diuresis hypervolemic   5. HTN - Well controlled.  Continue current medications and low sodium Dash type diet.    6. Mild nonobstructive CAD on LHC 2017 - Continue ASA and statin.   7. Substance abuse - Last did cocaine 1 week ago. Says he "slipped up". Has not done heroine since 2013. - Encouraged cessation. No change.  - Smokes 1 PPD. Was apparently smoking in bathroom last night and had his cigarettes taken away. Continue nicotine patch.  - Drinks 1 40 oz beer daily  8 Severe MR - TEE 11/14 appeared to show severe MR (in 2 jets).  The mitral  valve is thickened without frank prolapse. Mitraclip would be a consideration down the road if he is compliant with meds and followup.    Jenkins Rouge, MD  11/07/2018, 12:34 PM

## 2018-11-07 NOTE — Progress Notes (Signed)
   Subjective: Patient is feeling well. He is having good urine output. He feels that he is at baseline and ready to go home. He is tolerating PO intake and ambulating without difficulty. We discussed that we will aim for discharge tomorrow when we can have his medications delivered to bedside. He voices understanding. All questions and concerns addressed.   Objective: Vital signs in last 24 hours: Vitals:   11/06/18 1704 11/06/18 2056 11/07/18 0538 11/07/18 0935  BP: 110/75 113/78 (!) 115/95 (!) 114/92  Pulse: 86 87 92 85  Resp: 18 18 18 18   Temp: 98 F (36.7 C) 98.5 F (36.9 C) 98.1 F (36.7 C) (!) 97.4 F (36.3 C)  TempSrc:   Oral Oral  SpO2: 100% 99% 100% 99%  Weight:  75.4 kg    Height:       General: Well nourished male in no acute distress Pulm: Good air movement with no wheezing or crackles  CV: RRR, systolic murmur noted Abdomen: Soft, non-distended, no tenderness to palpation  Extremities: No LE edema   Assessment/Plan:  Brad [name] is a 57 y.o male with polysubstance use disorder and HFrEF (nonischemic) who presented to the ED with volume overload after running out of his lasix and using cocaine. He was found to be hyponatremic and subsequently admitted for further evaluation.   1. Acute on chronic HFrEF -Due to medication non-adherence and cocaine use -Improved volume status on exam. I&O's inaccurate. Weight down ~7 kg since admission. Negative 2.2L over past 24 hours.  -Likely approaching euvolemia given rise in creatinine, Bun, and bicarb. Will transition to PO lasix today  -Replacing Mg and K - Telemetry reviewed: NSR with PVCs -Continue Coreg 3.125 mg BID, Spironolactone to 25 mg, and Bidil20-37.5 mg TID - Greatly appreciate cardiology recommendations - Will arrange meds to bed when stable for discharge  2. Hyponatremia -Secondary to hypervolemia in the setting of acute on chronic HFrEF -[place] to 129 this AM, continue to monitor  - Continue  fluid restriction  3.Atrial flutter -S/p DCCV on 11/14  - Tele reviewed: NSR with PVCs - Continue Eliquis for at least another month  4. HTN -Blood pressure stable on Bidiland Coreg  5. DM - CBGsstable - sensitive sliding scale with meals and qhs  6. Substance abuse (EtOH and cocaine) - Patient has been counseled extensively on cessation of substance usewhich he seems amenable to   7.Candidal infection -Improving withnystatin powder and interdry application  Dispo: Anticipated discharge in approximately 1 day(s).   Ina Homes, MD 11/07/2018, 10:32 AM

## 2018-11-08 ENCOUNTER — Encounter (HOSPITAL_COMMUNITY): Payer: Self-pay | Admitting: Cardiology

## 2018-11-08 DIAGNOSIS — N183 Chronic kidney disease, stage 3 (moderate): Secondary | ICD-10-CM

## 2018-11-08 DIAGNOSIS — Z7901 Long term (current) use of anticoagulants: Secondary | ICD-10-CM

## 2018-11-08 LAB — BASIC METABOLIC PANEL
Anion gap: 3 — ABNORMAL LOW (ref 5–15)
BUN: 38 mg/dL — ABNORMAL HIGH (ref 6–20)
CO2: 33 mmol/L — ABNORMAL HIGH (ref 22–32)
Calcium: 9 mg/dL (ref 8.9–10.3)
Chloride: 91 mmol/L — ABNORMAL LOW (ref 98–111)
Creatinine, Ser: 2.43 mg/dL — ABNORMAL HIGH (ref 0.61–1.24)
GFR calc Af Amer: 32 mL/min — ABNORMAL LOW (ref >60)
GFR calc non Af Amer: 28 mL/min — ABNORMAL LOW (ref >60)
Glucose, Bld: 127 mg/dL — ABNORMAL HIGH (ref 70–99)
Potassium: 3.8 mmol/L (ref 3.5–5.1)
Sodium: 127 mmol/L — ABNORMAL LOW (ref 135–145)

## 2018-11-08 LAB — GLUCOSE, CAPILLARY
Glucose-Capillary: 139 mg/dL — ABNORMAL HIGH (ref 70–99)
Glucose-Capillary: 218 mg/dL — ABNORMAL HIGH (ref 70–99)

## 2018-11-08 LAB — MAGNESIUM: Magnesium: 1.9 mg/dL (ref 1.7–2.4)

## 2018-11-08 MED ORDER — FUROSEMIDE 40 MG PO TABS: 40.0000 mg | ORAL_TABLET | Freq: Two times a day (BID) | ORAL | Status: DC

## 2018-11-08 MED ORDER — POTASSIUM CHLORIDE CRYS ER 20 MEQ PO TBCR: 20.0000 meq | EXTENDED_RELEASE_TABLET | Freq: Every day | ORAL | 3 refills | Status: DC

## 2018-11-08 MED ORDER — SPIRONOLACTONE 25 MG PO TABS: 25.0000 mg | ORAL_TABLET | Freq: Every day | ORAL | 3 refills | Status: DC

## 2018-11-08 MED ORDER — FUROSEMIDE 40 MG PO TABS: 40.0000 mg | ORAL_TABLET | Freq: Two times a day (BID) | ORAL | 0 refills | Status: DC

## 2018-11-08 MED ORDER — INSULIN GLARGINE 100 UNIT/ML SOLOSTAR PEN: 35.0000 [IU] | PEN_INJECTOR | Freq: Two times a day (BID) | SUBCUTANEOUS | 5 refills | Status: DC

## 2018-11-08 MED ORDER — ATORVASTATIN CALCIUM 40 MG PO TABS: 40.0000 mg | ORAL_TABLET | Freq: Every day | ORAL | 6 refills | Status: DC

## 2018-11-08 MED ORDER — GABAPENTIN 300 MG PO CAPS: 600.0000 mg | ORAL_CAPSULE | Freq: Two times a day (BID) | ORAL | 3 refills | Status: DC

## 2018-11-08 MED ORDER — NYSTATIN 100000 UNIT/GM EX POWD: Freq: Three times a day (TID) | CUTANEOUS | 0 refills | Status: DC

## 2018-11-08 MED ORDER — ATORVASTATIN CALCIUM 40 MG PO TABS: 40.0000 mg | ORAL_TABLET | Freq: Every day | ORAL | 3 refills | Status: DC

## 2018-11-08 MED ORDER — CARVEDILOL 6.25 MG PO TABS: 6.2500 mg | ORAL_TABLET | Freq: Two times a day (BID) | ORAL | 3 refills | Status: DC

## 2018-11-08 MED ORDER — POTASSIUM CHLORIDE ER 20 MEQ PO TBCR: 10.0000 meq | EXTENDED_RELEASE_TABLET | Freq: Every day | ORAL | 0 refills | Status: DC

## 2018-11-08 MED ORDER — FUROSEMIDE 20 MG PO TABS: 60.0000 mg | ORAL_TABLET | Freq: Two times a day (BID) | ORAL | 0 refills | Status: DC

## 2018-11-08 MED ORDER — AMITRIPTYLINE HCL 75 MG PO TABS: 75.0000 mg | ORAL_TABLET | Freq: Every day | ORAL | 6 refills | Status: DC

## 2018-11-08 MED ORDER — CARVEDILOL 6.25 MG PO TABS: 6.2500 mg | ORAL_TABLET | Freq: Two times a day (BID) | ORAL | 0 refills | Status: DC

## 2018-11-08 MED ORDER — FUROSEMIDE 40 MG PO TABS: 40.0000 mg | ORAL_TABLET | Freq: Two times a day (BID) | ORAL | 3 refills | Status: DC

## 2018-11-08 MED ORDER — POTASSIUM CHLORIDE CRYS ER 20 MEQ PO TBCR
20.0000 meq | EXTENDED_RELEASE_TABLET | Freq: Every day | ORAL | Status: DC
  Administered 2018-11-08: 20 meq via ORAL
  Filled 2018-11-08: qty 1

## 2018-11-08 MED FILL — FUROSEMIDE 40 MG TABLET: 40 | 90 days supply | Qty: 180 | Fill #0

## 2018-11-08 MED FILL — SPIRONOLACTONE 25 MG TABLET: 25 | 90 days supply | Qty: 90 | Fill #0

## 2018-11-08 MED FILL — LANTUS SOLOSTAR 100 UNITS/M: 100 | 25 days supply | Qty: 15 | Fill #0

## 2018-11-08 MED FILL — POTASSIUM CL ER 20 MEQ TABL: 20 | 30 days supply | Qty: 30 | Fill #0

## 2018-11-08 MED FILL — PENTIPS 31G X 8 MM MISC: 31G X 8 MM | 30 days supply | Qty: 100 | Fill #0

## 2018-11-08 MED FILL — CARVEDILOL 6.25 MG TABLET: 6.25 | 90 days supply | Qty: 180 | Fill #0

## 2018-11-08 MED FILL — AMITRIPTYLINE HCL 75 MG TAB: 75 | 30 days supply | Qty: 30 | Fill #0

## 2018-11-08 MED FILL — GABAPENTIN 600 MG TABLET: 600 | 30 days supply | Qty: 60 | Fill #0

## 2018-11-08 MED FILL — NYSTATIN 100000 UNIT/GM POW: 100000 | 5 days supply | Qty: 15 | Fill #0

## 2018-11-08 MED FILL — ATORVASTATIN CALCIUM 40 MG: 40 | 90 days supply | Qty: 90 | Fill #0

## 2018-11-08 NOTE — Progress Notes (Signed)
HF medications sent to [facility] for 90 day supply (coreg, lasix, spiro, atorvastatin, and potassium). Pt already has Eliquis and Bidil from [facility] on Friday, so those were not sent in.   Losartan, amlodipine, and clonidine stopped.   HF follow up arranged and is on AVS.  Georgiana Shore, NP

## 2018-11-08 NOTE — Discharge Summary (Signed)
Name: Brad [name] MRN: [id number] DOB: [birth year] 57 y.o. PCP: Charlott Rakes, MD  Date of Admission: 10/31/2018 10:18 AM Date of Discharge: 11/08/2018 Attending Physician: Aldine Contes, MD  Discharge Diagnosis: 1. 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 Hyponatremia  3. Polysubstance use disorder 4. Atrial Flutter 5. Mitral Regurgitation   Discharge Medications: Allergies as of 11/08/2018      Reactions   Lisinopril Anaphylaxis, Other (See Comments)   Throat swells   Pamelor [nortriptyline Hcl] Anaphylaxis, Swelling   Throat swells   Angiotensin Receptor Blockers Other (See Comments)   (Angioedema with lisinopril, therefore ARB's are contraindicated)      Medication List    STOP taking these medications   amLODipine 10 MG tablet Commonly known as:  NORVASC   cloNIDine 0.1 MG tablet Commonly known as:  CATAPRES     TAKE these medications   amitriptyline 75 MG tablet Commonly known as:  ELAVIL Take 1 tablet (75 mg total) by mouth at bedtime.   apixaban 5 MG Tabs tablet Commonly known as:  ELIQUIS Take 1 tablet (5 mg total) by mouth 2 (two) times daily.   atorvastatin 40 MG tablet Commonly known as:  LIPITOR Take 1 tablet (40 mg total) by mouth daily.   carvedilol 6.25 MG tablet Commonly known as:  COREG Take 1 tablet (6.25 mg total) by mouth 2 (two) times daily with a meal. What changed:    medication strength  how much to take   cetirizine 10 MG tablet Commonly known as:  ZYRTEC Take 1 tablet (10 mg total) by mouth daily.   diclofenac sodium 1 % Gel Commonly known as:  VOLTAREN Apply 4 g topically 4 (four) times daily as needed (to painful sites).   furosemide 40 MG tablet Commonly known as:  LASIX Take 1 tablet (40 mg total) by mouth 2 (two) times daily.   gabapentin 300 MG capsule Commonly known as:  NEURONTIN Take 2 capsules (600 mg total) by mouth 2 (two) times daily.   glucose blood test strip Use  as instructed   hydrocortisone 2.5 % cream Apply 1 application topically 2 (two) times daily as needed (rash).   Insulin Glargine 100 UNIT/ML Solostar Pen Commonly known as:  LANTUS Inject 35 Units into the skin 2 (two) times daily.   isosorbide-hydrALAZINE 20-37.5 MG tablet Commonly known as:  BIDIL Take 1 tablet by mouth 3 (three) times daily.   losartan 25 MG tablet Commonly known as:  COZAAR Take 1 tablet (25 mg total) by mouth daily.   nystatin powder Commonly known as:  MYCOSTATIN/NYSTOP Apply topically 3 (three) times daily.   omeprazole 20 MG capsule Commonly known as:  PRILOSEC Take 1 capsule (20 mg total) by mouth daily. What changed:    when to take this  reasons to take this   PEN NEEDLES 31GX5/16" 31G X 8 MM Misc Use as directed   Potassium Chloride ER 20 MEQ Tbcr Take 10 mEq by mouth daily.   spironolactone 25 MG tablet Commonly known as:  ALDACTONE Take 1 tablet (25 mg total) by mouth daily.   thiamine 100 MG tablet Take 1 tablet (100 mg total) by mouth daily.     Disposition and follow-up:   Brad [name] was discharged from [facility] in Stable condition.  At the hospital follow up visit please address:  1.  HF. Ensure the patient is continuing to take all his medication as prescribed and following up with cardiology. Atrial  Flutter. Ensure the patient is taking his eliquis as prescribed.   2.  Labs / imaging needed at time of follow-up: BMP  3.  Pending labs/ test needing follow-up: None  Follow-up Appointments: Follow-up Information    [place]. Go on 11/16/2018.   Why:  at 1:50pm for an appointment with Dr Margarita Rana. Contact information: [place] [address] [phone]       Georgiana Shore, NP. Go on 11/16/2018.   Specialty:  Cardiology Why:  at 1130 AM in the [place] code for November: 1800. Park in garage off  [address]. There is a sign that says "heart and vascular". 1st floor of hospital. Bring medications with you. Take morning meds.  Contact information: [place] [id number] [place] Hospital Course by problem list:  1. 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 Brad [name] is a 57 y.o male with polysubstance use disorder and HFrEF (nonischemic) who presented to the ED with volume overload after running out of his lasix and using cocaine. He was found to be hyponatremic and subsequently admitted for further evaluation.He was aggressively diuresis with IV furosemide 120 mg BID. Heart Failure was consulted for medication optimization. Repeat echocardiogram was consistent with prior hypertension worsening mitral regurgitation. EP was also consulted for recommendations on ICD placement given his low left ventricular ejection fraction. But they recommended against placement given his ongoing substance use disorder. He was subsequently started on Coreg 62.5 mg BID, BiDil one tab TID, Lasix 40 mg PO BID, spironolactone 25 mg q.d., atorvastatin 40 mg q.d., apixaban 5 mg BID, and daily potassium supplementation. He was diuresed over 7kgs. Prior admissions were due to medication nonadherence; therefore, all outpatient medications were delivered to bedside prior to discharge. He will follow-up with his primary care doctor and the heart failure clinic.  2. Hyponatremia. The patient initially presented with sodium of 112. He was symptomatic from this hyponatremia. It was felt to be secondary to hypovolemia. He was subsequently diuresis as described above for his acute heart failure exacerbation and sodium improved to 127 on discharge.  3. Polysubstance use disorder. Patient continues to use cocaine and alcohol. We discussed the implications of his polysubstance use disorder. He is working on cessation.  4. Atrial Flutter. Patient presented with atrial flutter. He  underwent transesophageal echocardiogram on 11/14 and subsequently underwent cardioversion. After cardioversion he maintained a normal sinus rhythm with occasional PVCs. He was started on Eliquis for anticoagulation will need to remain on this for at least four weeks.  5. Mitral regurgitation. Patient's echocardiogram illustrated severe mitral regurgitation with mitral valve thickening. In the future cardiology will consider a mitraclip.  Discharge Vitals:   BP 115/77 (BP Location: Left Arm)   Pulse 85   Temp 97.6 F (36.4 C) (Oral)   Resp 18   Ht 5\' 11"  (1.803 m)   Wt 75.9 kg   SpO2 96%   BMI 23.33 kg/m   Pertinent Labs, Studies, and Procedures:   TTE 11/03/2018  - Left ventricle: The cavity size was moderately dilated. Wall   thickness was normal. Systolic function was severely reduced. The   estimated ejection fraction was in the range of 20% to 25%.   Diffuse hypokinesis. The study is not technically sufficient to   allow evaluation of LV diastolic function. - Aortic valve: Trileaflet. Sclerosis without stenosis. There was   mild  regurgitation. - Aorta: Ascending aortic diameter: 39 mm (S). - Ascending aorta: The ascending aorta was normal in size. - Mitral valve: Mildly thickened leaflets . Severe eccentric   regurgitation. Valve area by continuity equation (using LVOT   flow): 1.19 cm^2. - Left atrium: Severely dilated. - Right ventricle: The cavity size was moderately dilated. Moderate   systolic dysfunction. - Right atrium: Severely dilated. - Tricuspid valve: There was moderate to severe regurgitation. - Pulmonary arteries: PA peak pressure: 44 mm Hg (S). - Inferior vena cava: The vessel was dilated. The respirophasic   diameter changes were blunted (< 50%), consistent with elevated   central venous pressure.  TEE 11/14 Moderately dilated LV with mild LV hypertrophy.  EF 30-35%, diffuse hypokinesis.  Mildly dilated RV with mildly decreased systolic function.   Moderate-severe left atrial enlargement, no LA appendage thrombus.  Moderately dilated RA.  No PFO/ASD by color doppler.  Mild TR, peak RV-RA gradient 31 mmHg.  Trivial PI.  Trileaflet aortic valve with mild calcification.  Trivial aortic regurgitation, no aortic stenosis.  Mildly thickened mitral valve with two jets of mitral regurgitation.  PISA ERO 0.37 cm^2 for larger jet.  With combination of 2 jets, suspect severe mitral regurgitation.  No mitral stenosis. Flattening but not reversal of the pulmonary vein doppler pattern.  Normal caliber thoracic aorta with mild plaque.   Discharge Instructions: Discharge Instructions    (HEART FAILURE PATIENTS) Call MD:  Anytime you have any of the following symptoms: 1) 3 pound weight gain in 24 hours or 5 pounds in 1 week 2) shortness of breath, with or without a dry hacking cough 3) swelling in the hands, feet or stomach 4) if you have to sleep on extra pillows at night in order to breathe.   Complete by:  As directed    Diet - low sodium heart healthy   Complete by:  As directed    Increase activity slowly   Complete by:  As directed     Signed: Ina Homes, MD 11/08/2018, 11:46 AM

## 2018-11-08 NOTE — Progress Notes (Signed)
Patient scheduled follow-up appt in the [facility] Clinic on 11/26 at 11:30AM.

## 2018-11-08 NOTE — Progress Notes (Signed)
PT Cancellation Note  Patient Details Name: Brad [name] MRN: [id number] DOB: 25-Nov-1961   Cancelled Treatment:    Reason Eval/Treat Not Completed: Patient declined, no reason specified. Plan is for pt to d/c home today. Pt declines working with PT prior to d/c. Will keep on caseload until pt is out of the building. If pt changes his mind and wants to work with PT before he leaves, please page me. Thank you.    Thelma Comp 11/08/2018, 12:06 PM  Rolinda Roan, PT, DPT Acute Rehabilitation Services Pager: [phone] Office: [phone]

## 2018-11-08 NOTE — Progress Notes (Signed)
   Subjective: Brad [name] was seen and evaluated on morning rounds. No acute events overnight. He is doing well and stable for discharge. Will ensure he gets meds delivered to bedside.   Objective: Vital signs in last 24 hours: Vitals:   11/07/18 2137 11/08/18 0531 11/08/18 0742 11/08/18 0744  BP: 116/70 116/80 115/77   Pulse: 91 88 85   Resp: 18 18 18    Temp: 98.5 F (36.9 C) 98.4 F (36.9 C) 97.6 F (36.4 C)   TempSrc:  Oral Oral   SpO2: 99% 97% 96%   Weight:    75.9 kg  Height:       General: Well nourished male in no acute distress Pulm: Good air movement with no wheezing or crackles  CV: RRR, systolic murmur Abdomen: Soft, non-distended, no tenderness to palpation  Extremities: No LE edema   Assessment/Plan:  Brad [name] is a 57 y.o male with polysubstance use disorder and HFrEF (nonischemic) who presented to the ED with volume overload after running out of his lasix and using cocaine. He was found to be hyponatremic and subsequently admitted for further evaluation.  1. Acute on chronic HFrEF -Due to medication non-adherence and cocaine use -Improved volume status on exam. I&O's inaccurate. Weight down ~7 kg since admission.Negative 1.5L over past 24 hours.  - Telemetry reviewed: NSR with PVCs -ContinueCoreg 3.125 mg BID, Spironolactone to 25 mg, andBidil20-37.5 mg TID, furosemide 40 mg BID -Greatly appreciate cardiology recommendations -Will arrange meds to bed, discharge today   2. Hyponatremia -Secondary to hypervolemia in the setting of acute on chronic HFrEF -[place] to 127 this AM, continue to monitor  -Continue fluid restriction  3.Atrial flutter -S/p DCCV on 11/14  - Tele reviewed: NSR with PVCs -Continue Eliquis for at least another month  4. HTN -Blood pressure stable on Bidiland Coreg  5. DM - CBGsstable - sensitive sliding scale with meals and qhs  6. Substance abuse (EtOH and cocaine) -Patient has been  counseled extensively on cessation of substance usewhich he seems amenable to   7.Candidal infection -Improving withnystatin powder and interdry application  Dispo: Discharge today once meds are delivered to bedside.   Ina Homes, MD 11/08/2018, 11:25 AM

## 2018-11-08 NOTE — Progress Notes (Signed)
Patient ID: Brad [name], male   DOB: 01-May-1961, 57 y.o.   MRN: [id number]     Advanced Heart Failure Rounding Note  PCP-Cardiologist: [name], MD   Subjective:    S/p TEE/DCCV 11/14. He appears to be in NSR this morning.   Weights have not been accurate.  He is currently on Lasix 60 mg po bid, creatinine up to 2.4 this morning.   Echo 11/13: EF 20-25%, mild AI, severe MR, RV moderately down, RA and LA severely dilated, PA peak pressure 44  TEE showed severe MR.  Feels good. Denies SOB or dizziness.   Objective:   Weight Range: 75.9 kg Body mass index is 23.33 kg/m.   Vital Signs:   Temp:  [97.4 F (36.3 C)-98.5 F (36.9 C)] 97.6 F (36.4 C) (11/18 0742) Pulse Rate:  [85-91] 85 (11/18 0742) Resp:  [18-20] 18 (11/18 0742) BP: (114-118)/(70-92) 115/77 (11/18 0742) SpO2:  [96 %-100 %] 96 % (11/18 0742) Weight:  [75.9 kg] 75.9 kg (11/18 0744) Last BM Date: 11/06/18  Weight change: Filed Weights   11/05/18 2300 11/06/18 2056 11/08/18 0744  Weight: 75.4 kg 75.4 kg 75.9 kg    Intake/Output:   Intake/Output Summary (Last 24 hours) at 11/08/2018 0833 Last data filed at 11/08/2018 0600 Gross per 24 hour  Intake 1814.19 ml  Output 3700 ml  Net -1885.81 ml      Physical Exam    General: NAD Neck: No JVD, no thyromegaly or thyroid nodule.  Lungs: Clear to auscultation bilaterally with normal respiratory effort. CV: Nondisplaced PMI.  Heart regular S1/S2, no S3/S4, 3/6 HSM apex.  No peripheral edema.   Abdomen: Soft, nontender, no hepatosplenomegaly, no distention.  Skin: Intact without lesions or rashes.  Neurologic: Alert and oriented x 3.  Psych: Normal affect. Extremities: No clubbing or cyanosis.  HEENT: Normal.    Telemetry   Appears to be NSR.  Personally reviewed.   EKG    No new tracings.   Labs    CBC No results for input(s): WBC, NEUTROABS, HGB, HCT, MCV, PLT in the last 72 hours. Basic Metabolic Panel Recent Labs     11/07/18 0450 11/08/18 0551  NA 129* 127*  K 3.3* 3.8  CL 88* 91*  CO2 28 33*  GLUCOSE 145* 127*  BUN 30* 38*  CREATININE 2.34* 2.43*  CALCIUM 8.8* 9.0  MG 1.6* 1.9   Liver Function Tests No results for input(s): AST, ALT, ALKPHOS, BILITOT, PROT, ALBUMIN in the last 72 hours. No results for input(s): LIPASE, AMYLASE in the last 72 hours. Cardiac Enzymes No results for input(s): CKTOTAL, CKMB, CKMBINDEX, TROPONINI in the last 72 hours.  BNP: BNP (last 3 results) Recent Labs    08/20/18 1410 08/26/18 0404 10/31/18 1604  BNP 757.1* 953.7* 1,446.3*    ProBNP (last 3 results) No results for input(s): PROBNP in the last 8760 hours.   D-Dimer No results for input(s): DDIMER in the last 72 hours. Hemoglobin A1C No results for input(s): HGBA1C in the last 72 hours. Fasting Lipid Panel No results for input(s): CHOL, HDL, LDLCALC, TRIG, CHOLHDL, LDLDIRECT in the last 72 hours. Thyroid Function Tests No results for input(s): TSH, T4TOTAL, T3FREE, THYROIDAB in the last 72 hours.  Invalid input(s): FREET3  Other results:   Imaging    No results found.   Medications:     Scheduled Medications: . amitriptyline  75 mg Oral QHS  . apixaban  5 mg Oral BID  . atorvastatin  40 mg  Oral Daily  . carvedilol  6.25 mg Oral BID WC  . [START ON 11/09/2018] furosemide  40 mg Oral BID  . gabapentin  600 mg Oral BID  . insulin aspart  0-5 Units Subcutaneous QHS  . insulin aspart  0-9 Units Subcutaneous TID WC  . isosorbide-hydrALAZINE  1 tablet Oral TID  . nicotine  21 mg Transdermal Daily  . nystatin   Topical TID  . potassium chloride  40 mEq Oral BID  . spironolactone  25 mg Oral Daily    Infusions:   PRN Medications: acetaminophen **OR** acetaminophen, ondansetron **OR** ondansetron (ZOFRAN) IV, polyethylene glycol    Patient Profile   Brad [name] is a 57 y.o. male with a history of chronic systolic HF due to NICM (EF 20-25%), atrial fibrillation, cocaine  and heroin use, alcohol abuse, medication noncompliance, DM, hyponatremia, frequent falls, CKD 3, hepatitis C, cirrhosis, and NSVT.   Admitted 11/10 with SOB and BLE edema.   Assessment/Plan    1. Acute on chronic systolic CHF: Nonischemic cardiomyopathy based on coronary angiography in 2017. Echo in 8/19 with EF 20-25%, moderate MR. TEE (11/19) with EF 30-35% moderate LV dilation, mild LVH, mild RV dilation/mild dysfunction, severe MR in 2 jets.  Suspect CMP is related to substance abuse =>ongoing heavy ETOH and also cocaine abuse. Atrial flutter likely worsened his symptoms. He appears to be in NSR today.  He does not look volume overloaded on exam.  Feels good.  Creatinine up to 2.4.   - He got one dose of Lasix today, hold additional Lasix until tomorrow and will decrease dose to 40 mg po bid.   -ContinueBidil 1 tab tid.   - Continue spironolactone 25 mg daily. - Continue Coreg 6.25 mg bid. Risk with cocaine is not high as it is also an alpha blocker. Weagain discussedcocaine heart risk and he says that he will stay off it.  - Also imperative to stop ETOH. We discussed thisagain.  - With active substance abuse,notcandidate for ICD.  2. Atrial flutter: Patient appears to have been in flutter for several months. Suspect this worsens CHF. He has not been anticoagulated due to falls (likely from intoxication or hyponatremia, no syncope). He was cardioverted back to NSR and will have to be on anticoagulation for at least a month. If he can remain abstinent from substance abuse, this shouldbe feasible. EP has seen, do not think he is a candidate for flutter ablation. -Continueapixaban 5 mg bid.  3. Hyponatremia: Hypervolemic hyponatremia. Suspect due to CHF and excessive fluid intake (much of it likely ETOH prior to admission).  He got tolvaptan twice.He did not have marked symptoms of hyponatremia which suggests chronicity but sodium level a couple months ago was in the  130s. Sodium decreasing again, 127 today.  - Continue fluid restriction, make sure it is being followed.   4. AKI on CKD stage 3:Creatinine higher, will hold further Lasix today and decrease dose for tomorrow.  5. Substance abuse: Cocaine, ETOH, cigarettes.I discussed thecontribution of these to heart disease. He understands and is going to try to stay off when he leaves the hospital. 6. Mitral regurgitation: TEE today appeared to show severe MR (in 2 jets).  The mitral valve is thickened without frank prolapse. Mitraclip would be a consideration down the road if he is compliant with meds and followup.  7. Disposition: Suspect he can go home soon.  We will arrange CHF clinic followup.  Meds for discharge: Coreg 6.25 mg bid, Bidil 1 tab  tid, Lasix 40 mg po bid, spironolactone 25 daily, apixaban 5 bid, atorvastatin 40 daily, KCl 20 daily.    Loralie Champagne 11/08/2018 8:33 AM

## 2018-11-08 NOTE — Discharge Instructions (Signed)
Thank you for allowing [place] to provide your care. Please take all your medication as prescribed and avoid using alcohol or cocaine.   Information on my medicine - ELIQUIS (apixaban)  This medication education was reviewed with me or my healthcare representative as part of my discharge preparation.   Why was Eliquis prescribed for you? Eliquis was prescribed for you to reduce the risk of a blood clot forming that can cause a stroke if you have a medical condition called atrial fibrillation (a type of irregular heartbeat).  What do You need to know about Eliquis ? Take your Eliquis TWICE DAILY - one tablet in the morning and one tablet in the evening with or without food. If you have difficulty swallowing the tablet whole please discuss with your pharmacist how to take the medication safely.  Take Eliquis exactly as prescribed by your doctor and DO NOT stop taking Eliquis without talking to the doctor who prescribed the medication.  Stopping may increase your risk of developing a stroke.  Refill your prescription before you run out.  After discharge, you should have regular check-up appointments with your healthcare provider that is prescribing your Eliquis.  In the future your dose may need to be changed if your kidney function or weight changes by a significant amount or as you get older.  What do you do if you miss a dose? If you miss a dose, take it as soon as you remember on the same day and resume taking twice daily.  Do not take more than one dose of ELIQUIS at the same time to make up a missed dose.  Important Safety Information A possible side effect of Eliquis is bleeding. You should call your healthcare provider right away if you experience any of the following: ? Bleeding from an injury or your nose that does not stop. ? Unusual colored urine (red or dark brown) or unusual colored stools (red or black). ? Unusual bruising for unknown reasons. ? A serious fall or if you hit  your head (even if there is no bleeding).  Some medicines may interact with Eliquis and might increase your risk of bleeding or clotting while on Eliquis. To help avoid this, consult your healthcare provider or pharmacist prior to using any new prescription or non-prescription medications, including herbals, vitamins, non-steroidal anti-inflammatory drugs (NSAIDs) and supplements.  This website has more information on Eliquis (apixaban): http://www.eliquis.com/eliquis/home

## 2018-11-09 ENCOUNTER — Telehealth: Payer: Self-pay

## 2018-11-09 IMAGING — CT CT CERVICAL SPINE W/O CM
5 of 9 series · 11 of 33 positions shown, 12 images · non-contrast
Comparison: Previous exams including head CT dated 09/18/2016 and
CTA neck dated 11/02/2017.

CLINICAL DATA: Found down this morning. Complained of severe neck
pain on [REDACTED].

EXAM:
CT HEAD WITHOUT CONTRAST
CT CERVICAL SPINE WITHOUT CONTRAST
TECHNIQUE: Multidetector CT imaging of the head and cervical spine was
performed following the standard protocol without intravenous
contrast. Multiplanar CT image reconstructions of the cervical spine
were also generated.

[Series 6: head bone · axial · 0.48mm/px · z∈[-31,+23]mm · 2 of 83 slices shown]
[im 28/83  bone]
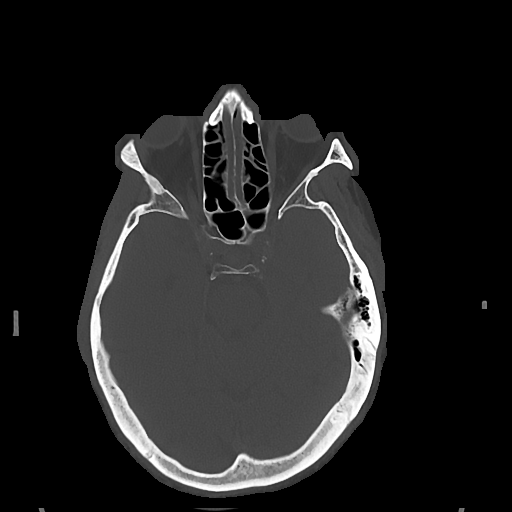
[im 55/83  bone]
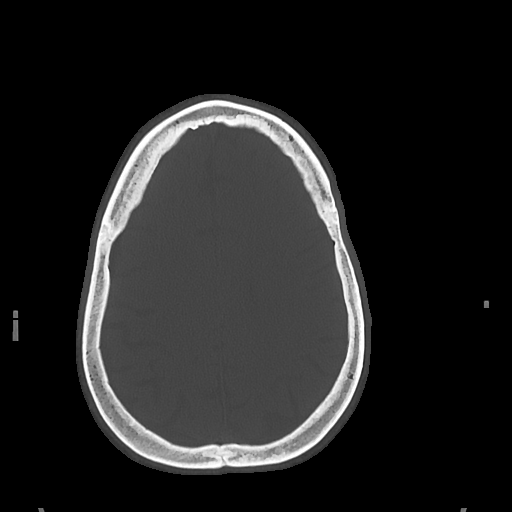

[Series 10: c spine soft · axial · 0.39mm/px · z∈[-168,-106]mm · 2 of 93 slices shown]
[im 31/93  soft-tissue]
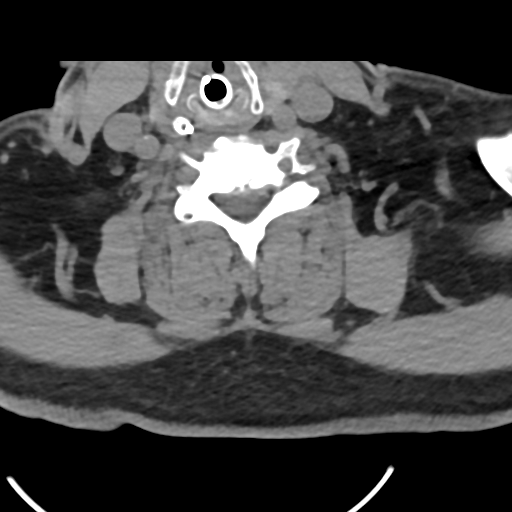
[im 62/93  soft-tissue]
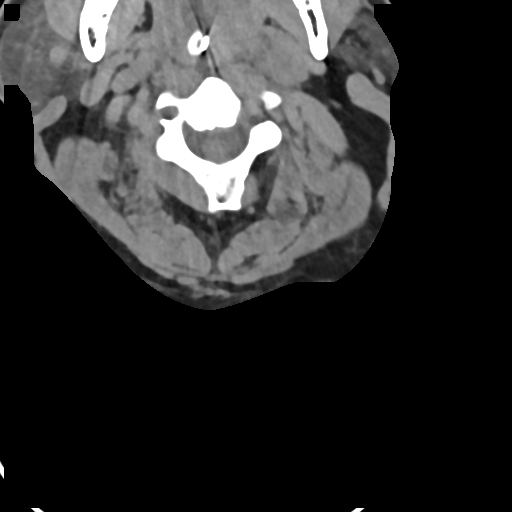

[Series 11: sag bone · sagittal · 0.30mm/px · 4 of 61 slices shown]
[im 13/61  bone]
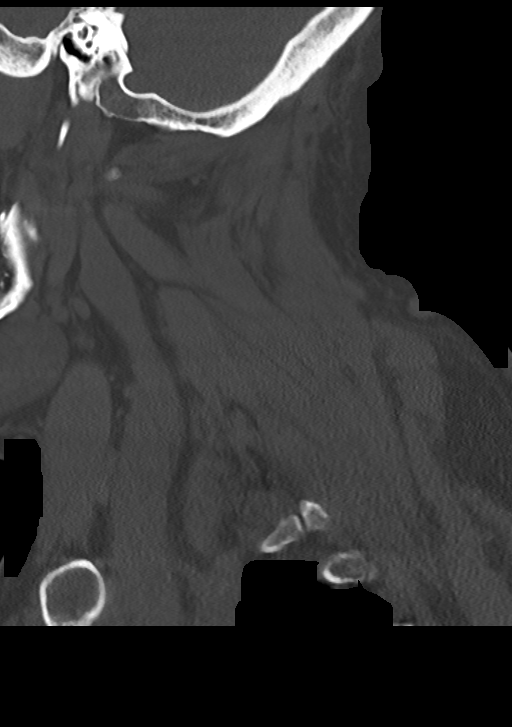
[im 25/61  bone]
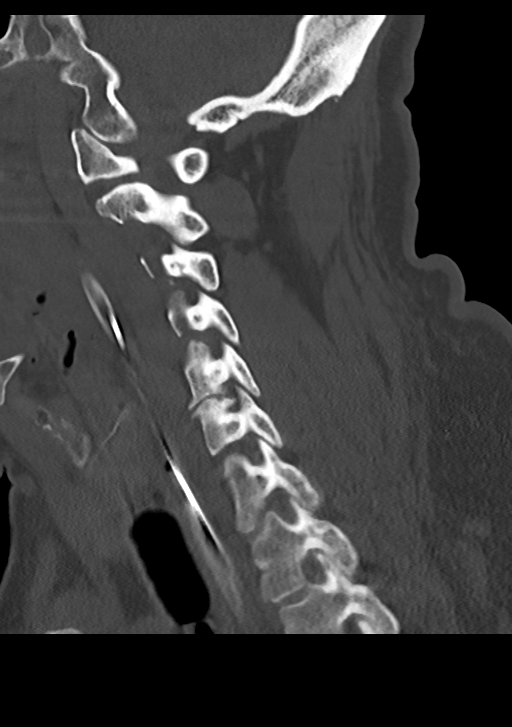
[im 37/61  bone]
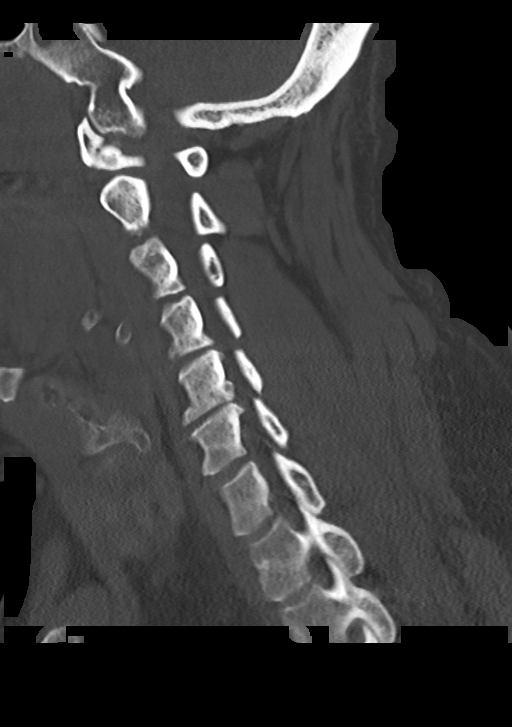
[im 49/61  bone]
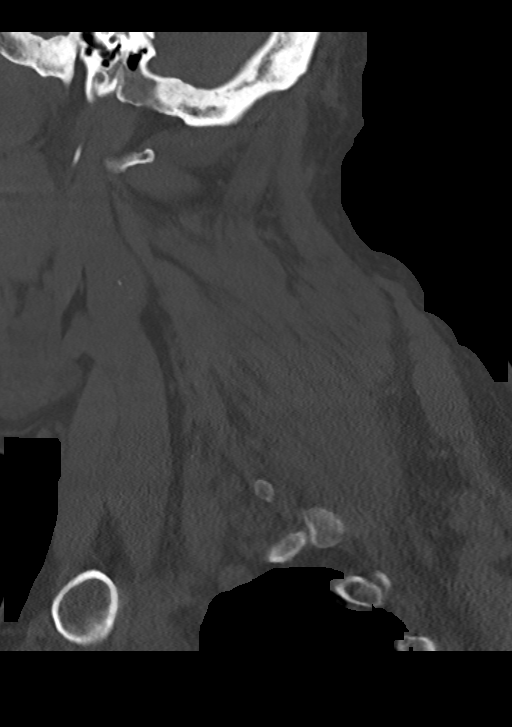

[Series 12: cor bone · coronal · 0.31mm/px · 1 of 61 slices shown]
[im 31/61  bone]
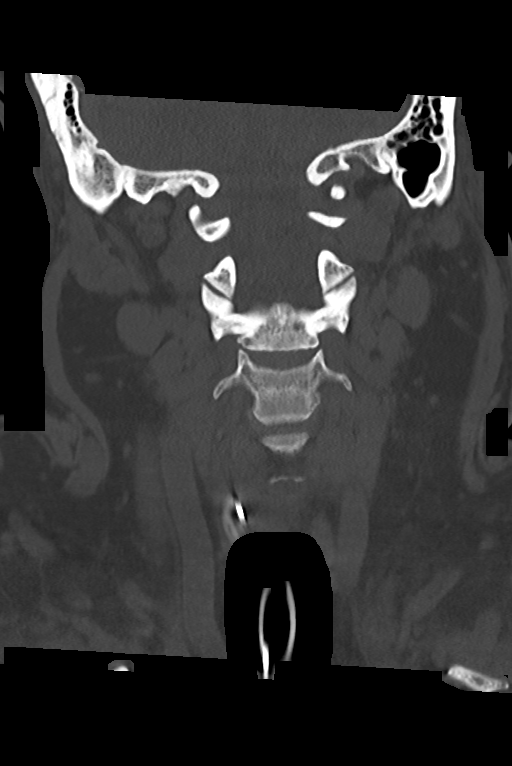

[Series 15: orthogonal axials · axial · 0.21mm/px · z∈[-193,-135]mm · 2 of 93 slices shown, 3 images]
[im 31/93  soft-tissue]
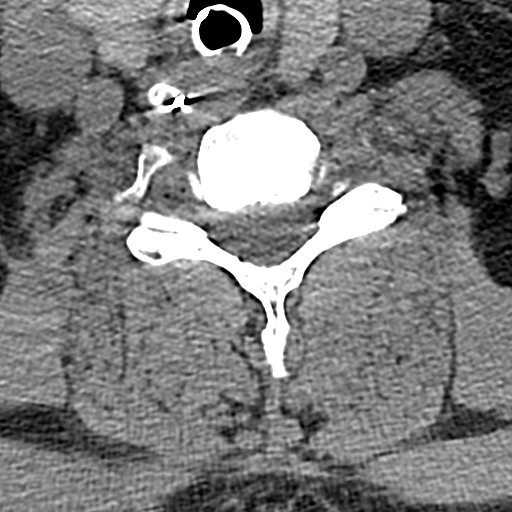
[im 31/93  bone]
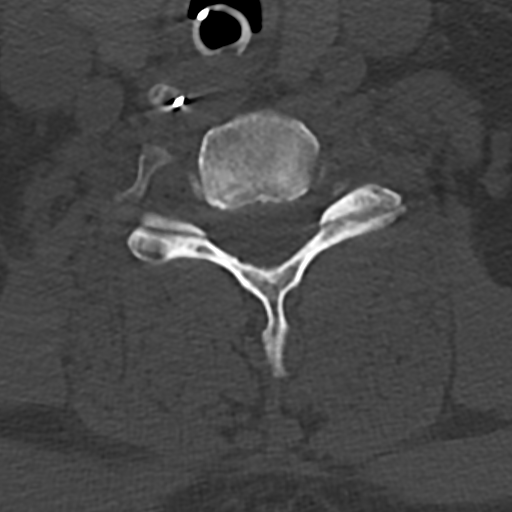
[im 62/93  bone]
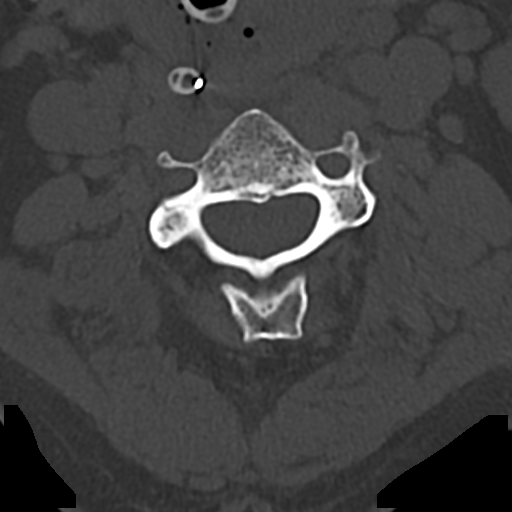

[11 of 33 positions shown; findings below may reference images not displayed]

FINDINGS: CT HEAD FINDINGS

Brain: Ventricles are stable in size and configuration. Mild chronic
small vessel ischemic changes noted within the bilateral
periventricular white matter regions. There is no mass, hemorrhage,
edema or other evidence of acute parenchymal abnormality. No
extra-axial hemorrhage.

Vascular: There are chronic calcified atherosclerotic changes of the
large vessels at the skull base. No unexpected hyperdense vessel.

Skull: Normal. Negative for fracture or focal lesion.

Sinuses/Orbits: No acute finding.

Other: None.

CT CERVICAL SPINE FINDINGS

Alignment: Mild straightening of the normal cervical spine lordosis
which is likely related to patient positioning. No evidence of acute
vertebral body subluxation.

Skull base and vertebrae: No fracture line or displaced fracture
fragment identified.

Soft tissues and spinal canal: No visible canal hematoma.
Endotracheal tube and enteric tube in place limiting
characterization of the prevertebral soft tissues.

Disc levels: Degenerative spurring within the mid cervical spine,
with associated disc-osteophytic bulges at the C4-5 and C5-6 levels
causing moderate to severe central canal stenoses and neural foramen
stenoses with possible associated nerve root impingement.

Upper chest: No acute findings.

Other: Bilateral carotid atherosclerosis.
IMPRESSION: 1. No acute intracranial abnormality. No intracranial mass,
hemorrhage or edema. No skull fracture. Mild chronic small vessel
ischemic change in the white matter.
2. No fracture or acute subluxation within the cervical spine.
3. Degenerative changes within the mid cervical spine, with
associated disc-osteophytic bulges at C4-5 and C5-6 causing moderate
to severe central canal stenoses with probable associated nerve root
impingement.
4. Carotid atherosclerosis.

## 2018-11-09 IMAGING — DX DG CHEST 1V PORT
1 series · 1 of 1 positions shown · non-contrast
Comparison: Portable chest x-ray earlier today [DATE] a.m..

CLINICAL DATA: Intubation.  Nasogastric tube placement at bedside.

EXAM:
PORTABLE CHEST 1 VIEW [DATE] a.m.:

[chest ap]
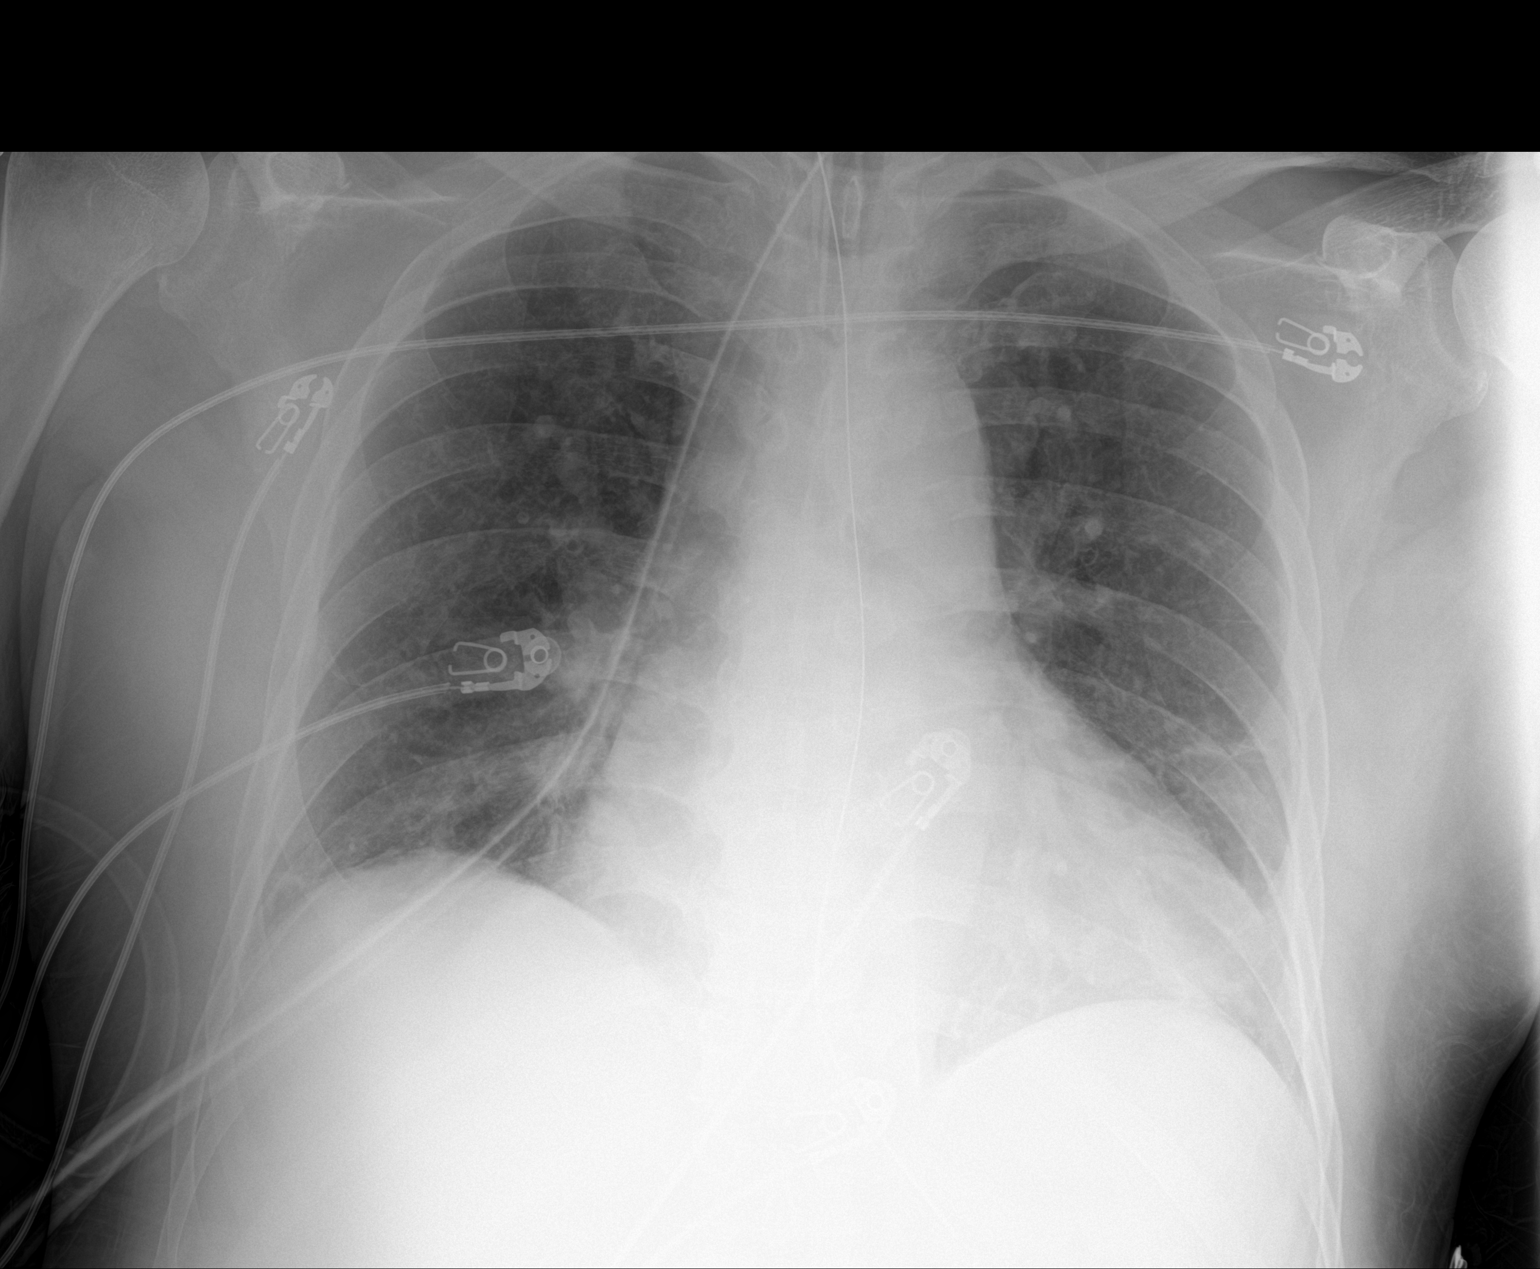

[1 of 1 positions shown; findings below may reference images not displayed]

FINDINGS: Endotracheal tube tip in satisfactory position projecting
approximately 5 cm above the carina. Nasogastric tube courses below
the diaphragm into the stomach though its tip is not included on the
image.

Cardiac silhouette mildly to moderately enlarged. Mild pulmonary
venous hypertension without overt edema. Mild atelectasis in the
lung bases and in the lingula, progressive since earlier this
morning. No confluent airspace consolidation.
IMPRESSION: 1. Support apparatus satisfactory. Endotracheal tube tip projects
approximately 5 cm above the carina. Nasogastric tube courses into
the stomach.
2. Mild atelectasis involving both lung bases and the lingula,
progressive since earlier this morning. No acute cardiopulmonary
disease otherwise.
3. Cardiomegaly.  Pulmonary venous hypertension without overt edema.

## 2018-11-09 IMAGING — DX DG CHEST 1V PORT
1 series · 1 of 1 positions shown · non-contrast
Comparison: November 02, 2017

CLINICAL DATA: Altered mental status

EXAM:
PORTABLE CHEST 1 VIEW

[chest ap]
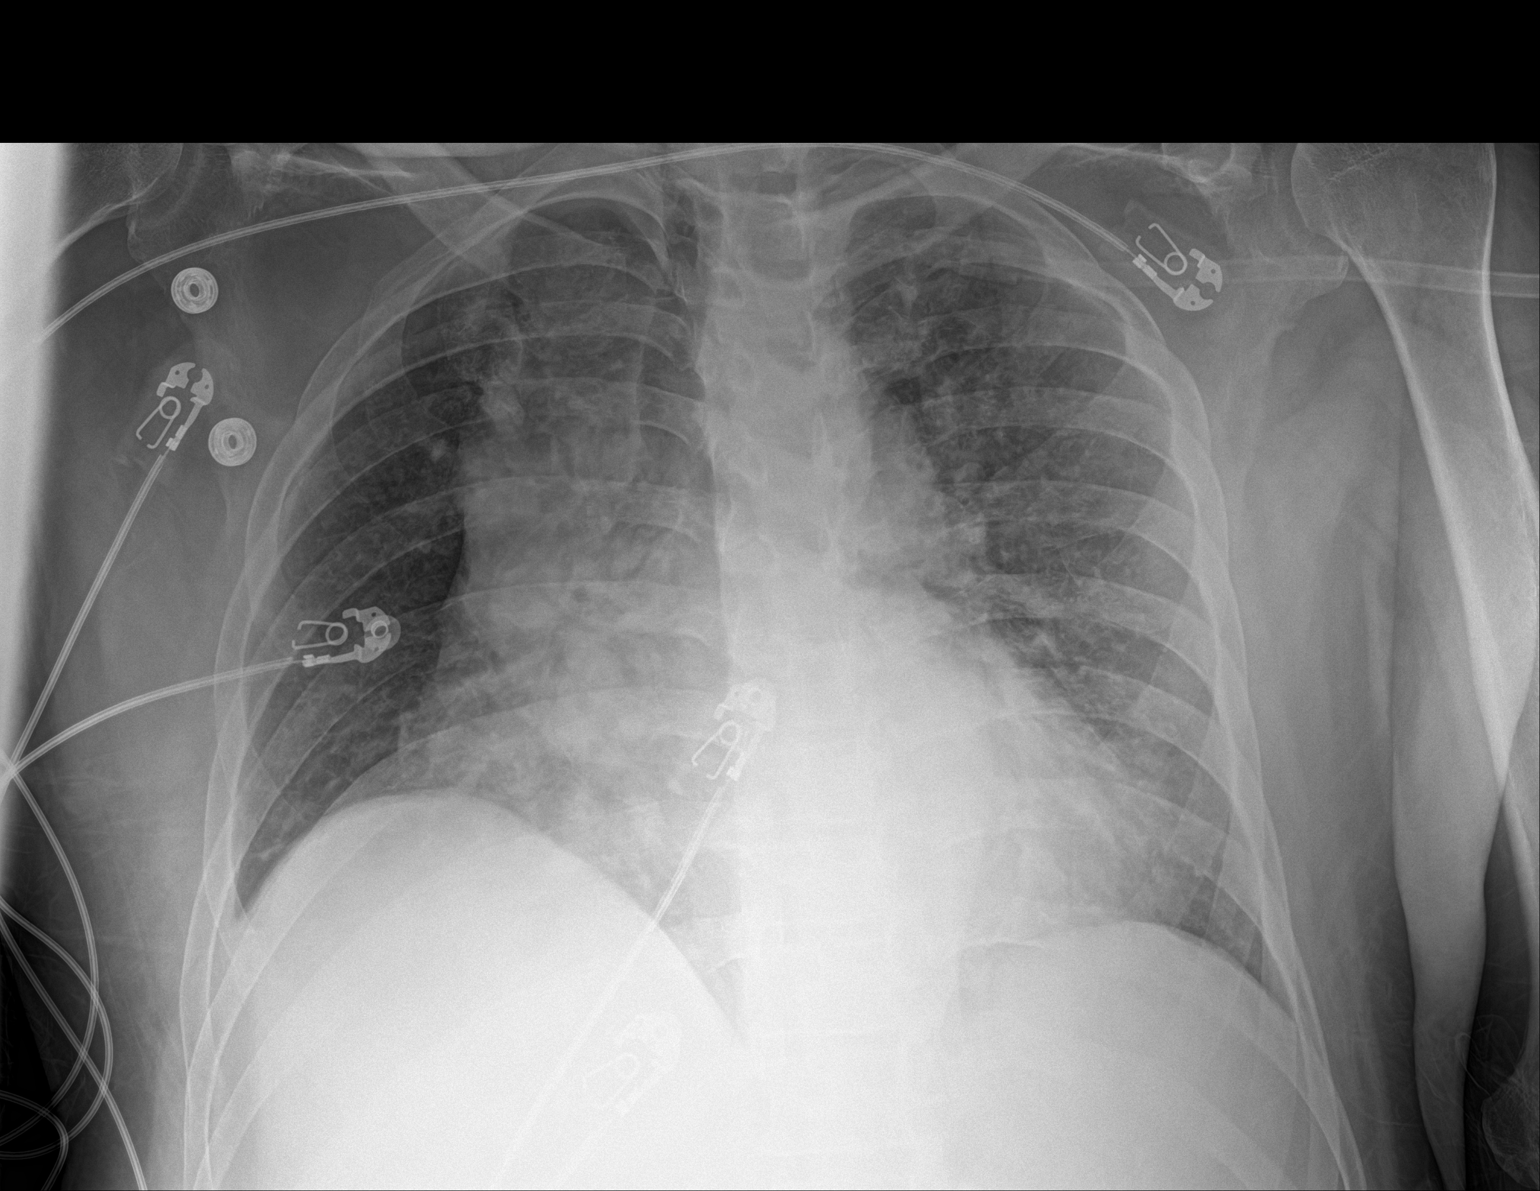

[1 of 1 positions shown; findings below may reference images not displayed]

FINDINGS: Patient is somewhat rotated. There is no edema or consolidation.
There is cardiomegaly with pulmonary vascularity within normal
limits. No adenopathy. No bone lesions.
IMPRESSION: Cardiomegaly.  No edema or consolidation.

## 2018-11-09 NOTE — Telephone Encounter (Signed)
Transition Care Management Follow-up Telephone Call Date of discharge and from where: 11/08/2018 - [facility]  Call placed to [phone] ( number provided by patient) and a message was left requesting the patient return the call to this CM # [phone]. He informed this CM when he was in the hospital that there was no other number to reach him as he had a new phone and did not have the phone with the new number with him in the hospital

## 2018-11-10 ENCOUNTER — Telehealth: Payer: Self-pay

## 2018-11-10 ENCOUNTER — Telehealth: Payer: Self-pay | Admitting: Family Medicine

## 2018-11-10 IMAGING — MR MR HEAD WO/W CM
10 of 13 series · 33 of 48 positions shown · IV contrast (multihance)
Comparison: Priors CT from 11/06/2017.

CLINICAL DATA: Initial evaluation for acute altered mental status.

EXAM:
MRI HEAD WITHOUT AND WITH CONTRAST
TECHNIQUE: Multiplanar, multiecho pulse sequences of the brain and surrounding
structures were obtained without and with intravenous contrast.
CONTRAST:  20mL MULTIHANCE GADOBENATE DIMEGLUMINE 529 MG/ML IV SOLN

[Series 3: T1 · sagittal · 5.0mm · 0.47mm/px · 2 of 26 slices shown]
[im 1/26]
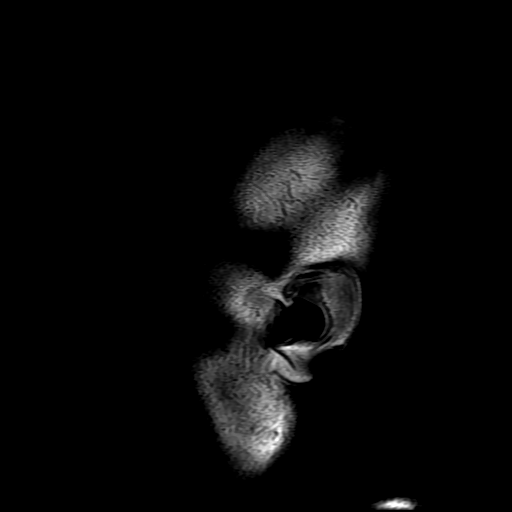
[im 26/26]
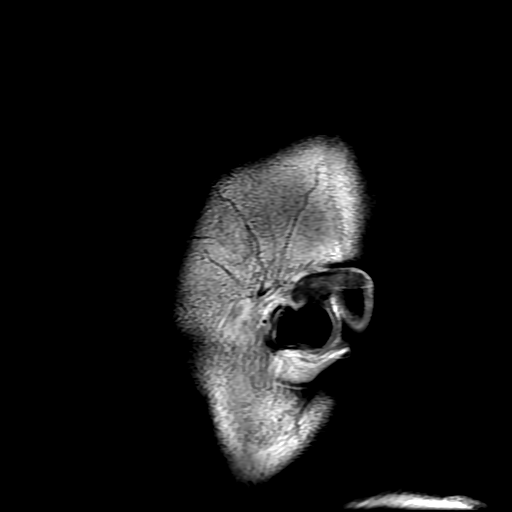

[Series 4: DWI · axial · 3.0mm · 1.09mm/px · z∈[-49,+110]mm · 8 of 108 slices shown (1 of 4)]
[im 1/108]
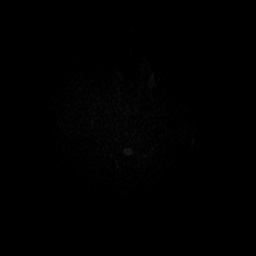
[im 16/108]
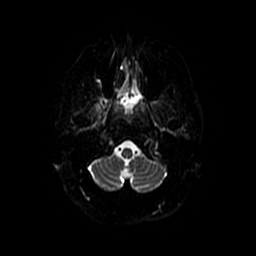
[im 31/108]
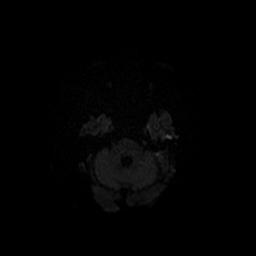
[im 46/108]
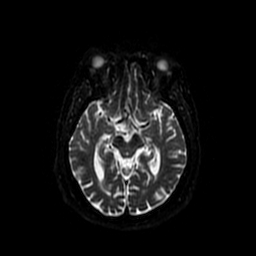
[im 62/108]
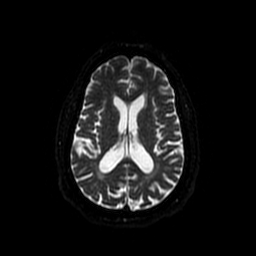
[im 77/108]
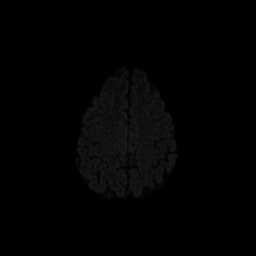
[im 92/108]
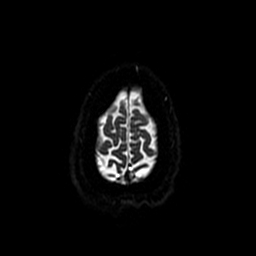
[im 108/108]
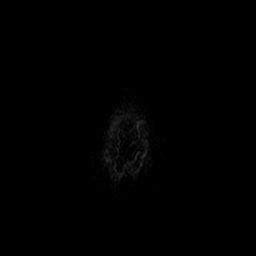

[Series 5: DWI · coronal · 5.0mm · 1.09mm/px · 6 of 80 slices shown (2 of 4)]
[im 1/80]
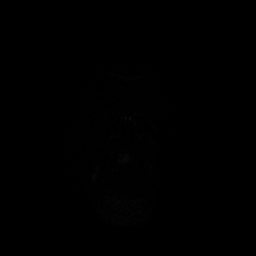
[im 16/80]
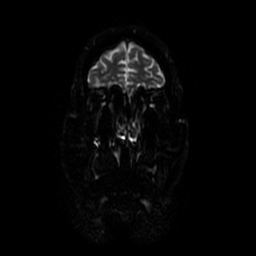
[im 32/80]
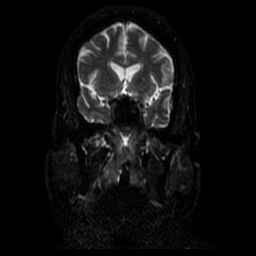
[im 48/80]
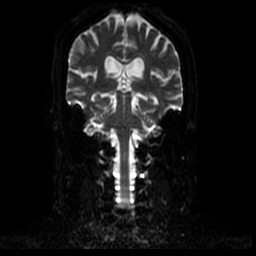
[im 64/80]
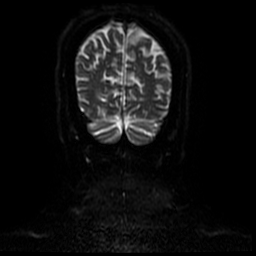
[im 80/80]
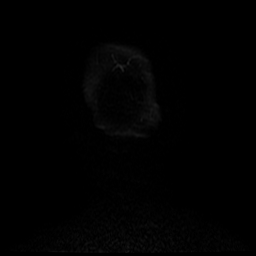

[Series 6: T2 · axial · 5.0mm · 0.47mm/px · z∈[-59,+109]mm · 2 of 29 slices shown]
[im 1/29]
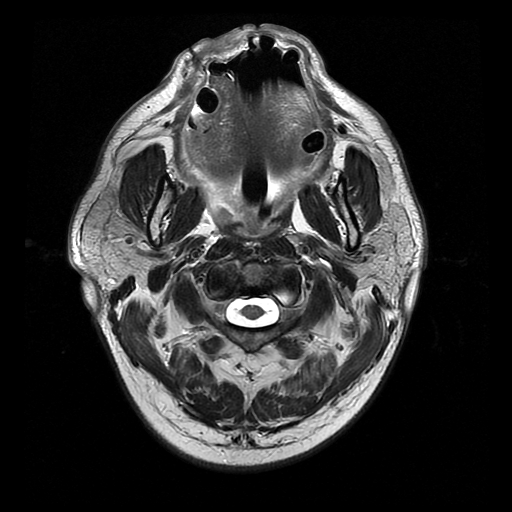
[im 29/29]
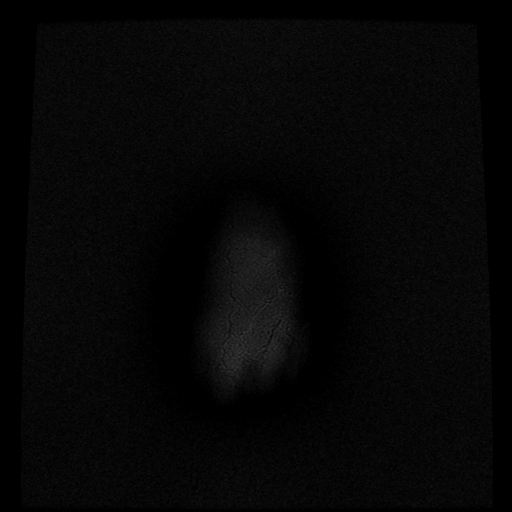

[Series 7: FLAIR · axial · 3.0mm · 0.47mm/px · z∈[-59,+109]mm · 2 of 29 slices shown]
[im 1/29]
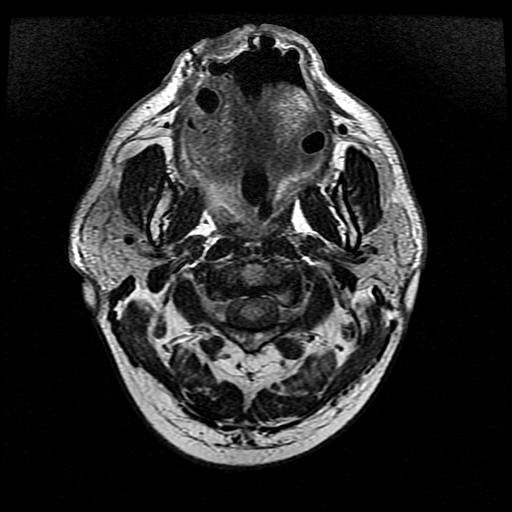
[im 29/29]
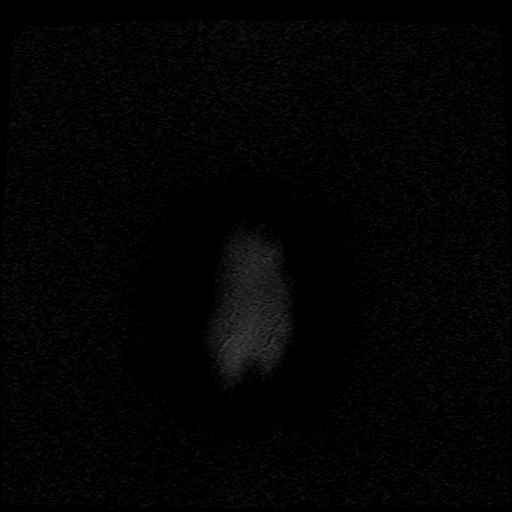

[Series 10: T2 post-contrast · coronal · 5.0mm · 0.43mm/px · 2 of 28 slices shown]
[im 1/28]
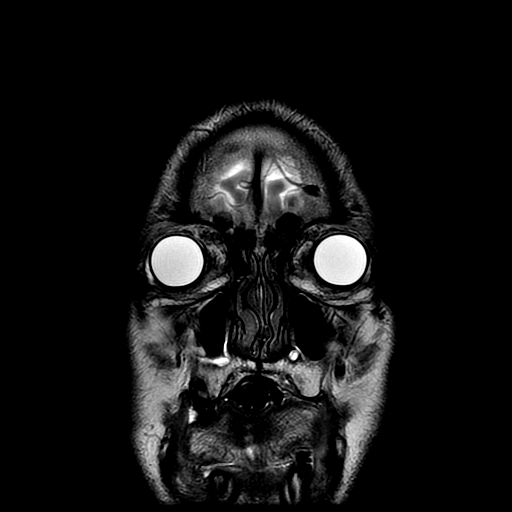
[im 28/28]
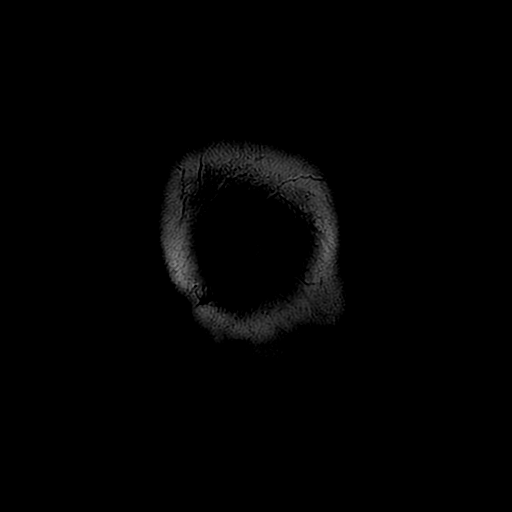

[Series 12: T1 post-contrast · coronal · 5.0mm · 0.43mm/px · 2 of 28 slices shown (1 of 2)]
[im 1/28]
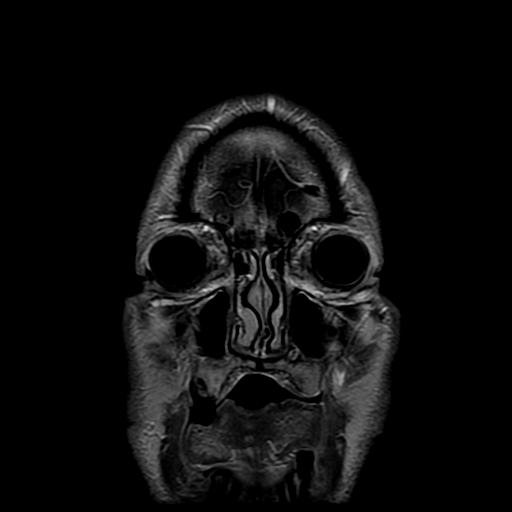
[im 28/28]
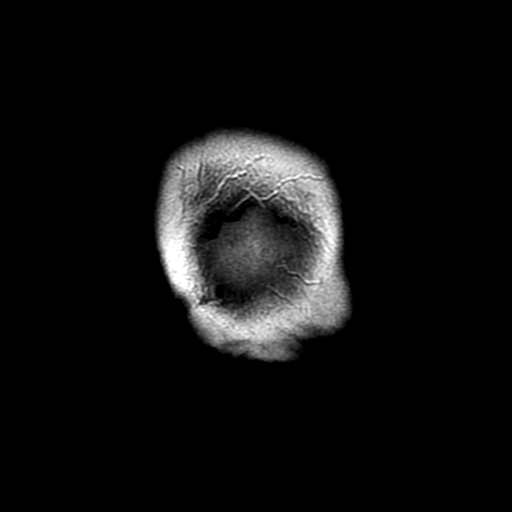

[Series 13: T1 post-contrast · sagittal · 5.0mm · 0.47mm/px · 2 of 26 slices shown (2 of 2)]
[im 1/26]
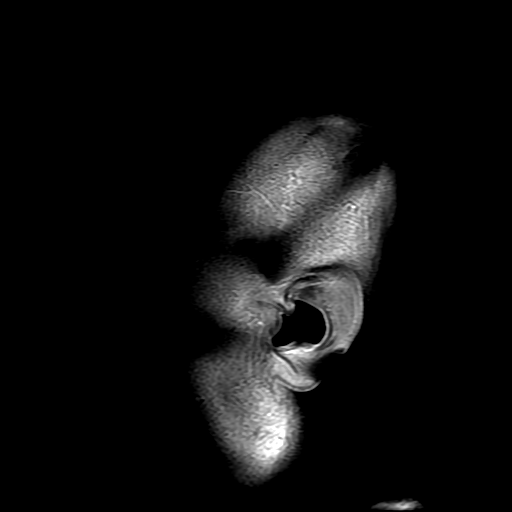
[im 26/26]
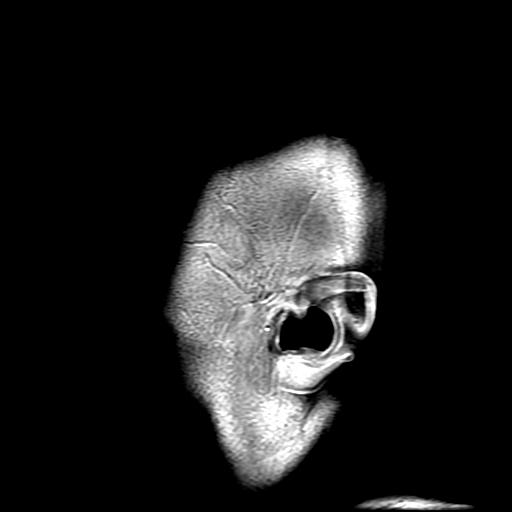

[Series 400: DWI · axial · 3.0mm · 1.09mm/px · z∈[-49,+110]mm · 4 of 54 slices shown (3 of 4)]
[im 1/54]
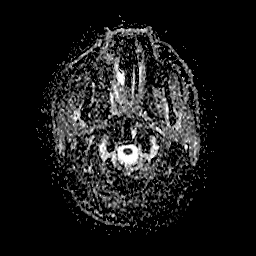
[im 18/54]
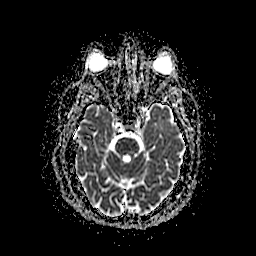
[im 36/54]
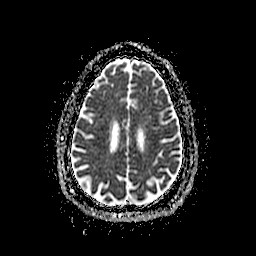
[im 54/54]
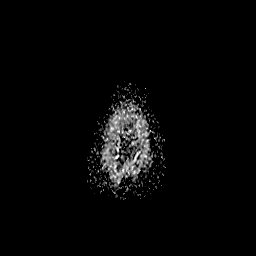

[Series 500: DWI · coronal · 5.0mm · 1.09mm/px · 3 of 40 slices shown (4 of 4)]
[im 1/40]
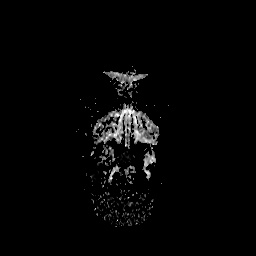
[im 20/40]
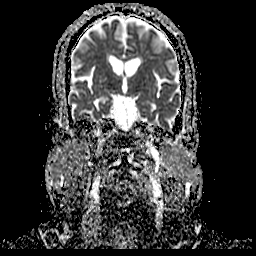
[im 40/40]
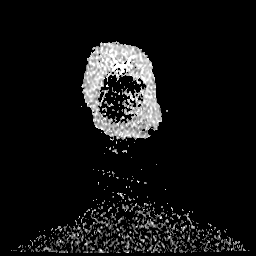

[33 of 48 positions shown; findings below may reference images not displayed]

FINDINGS: Brain: Mild cerebral volume loss with chronic small vessel ischemic
disease, stable from previous. No abnormal foci of restricted
diffusion to suggest acute or subacute ischemia. Gray-white matter
differentiation maintained. No evidence for chronic infarction. No
evidence for acute or chronic intracranial hemorrhage. No mass
lesion, midline shift or mass effect. No hydrocephalus. No
extra-axial fluid collection. Major dural sinuses grossly patent. No
abnormal enhancement.

Subcentimeter T1 hyperintense lesion at the superior aspect of the
pituitary gland, possibly a small proteinaceous and/or hemorrhagic
cyst, stable from previous, and of doubtful significance.

Vascular: Major intravascular flow voids are maintained.

Skull and upper cervical spine: Craniocervical junction within
normal limits. Mild scattered degenerative spondylolysis noted
within the upper cervical spine. Probable mild to moderate stenosis
at C4-5. Bone marrow signal intensity within normal limits. No scalp
soft tissue abnormality.

Sinuses/Orbits: Globes oral soft tissues within normal limits.
Scattered mucosal thickening throughout the paranasal sinuses. Small
fluid levels noted within the sphenoid sinuses. Fluid seen layering
within the nasopharynx. Patient is intubated. Trace bilateral
mastoid effusions. Inner ear structures normal.

Other: None.
IMPRESSION: 1. No acute intracranial abnormality.
2. Mild parenchymal volume loss with chronic small vessel ischemic
disease, stable.

## 2018-11-10 IMAGING — DX DG CHEST 1V PORT
1 series · 1 of 1 positions shown · non-contrast
Comparison: One-view chest x-ray 11/06/2017.

CLINICAL DATA: Acute respiratory failure with hypoxia.

EXAM:
PORTABLE CHEST 1 VIEW

[chest ap]
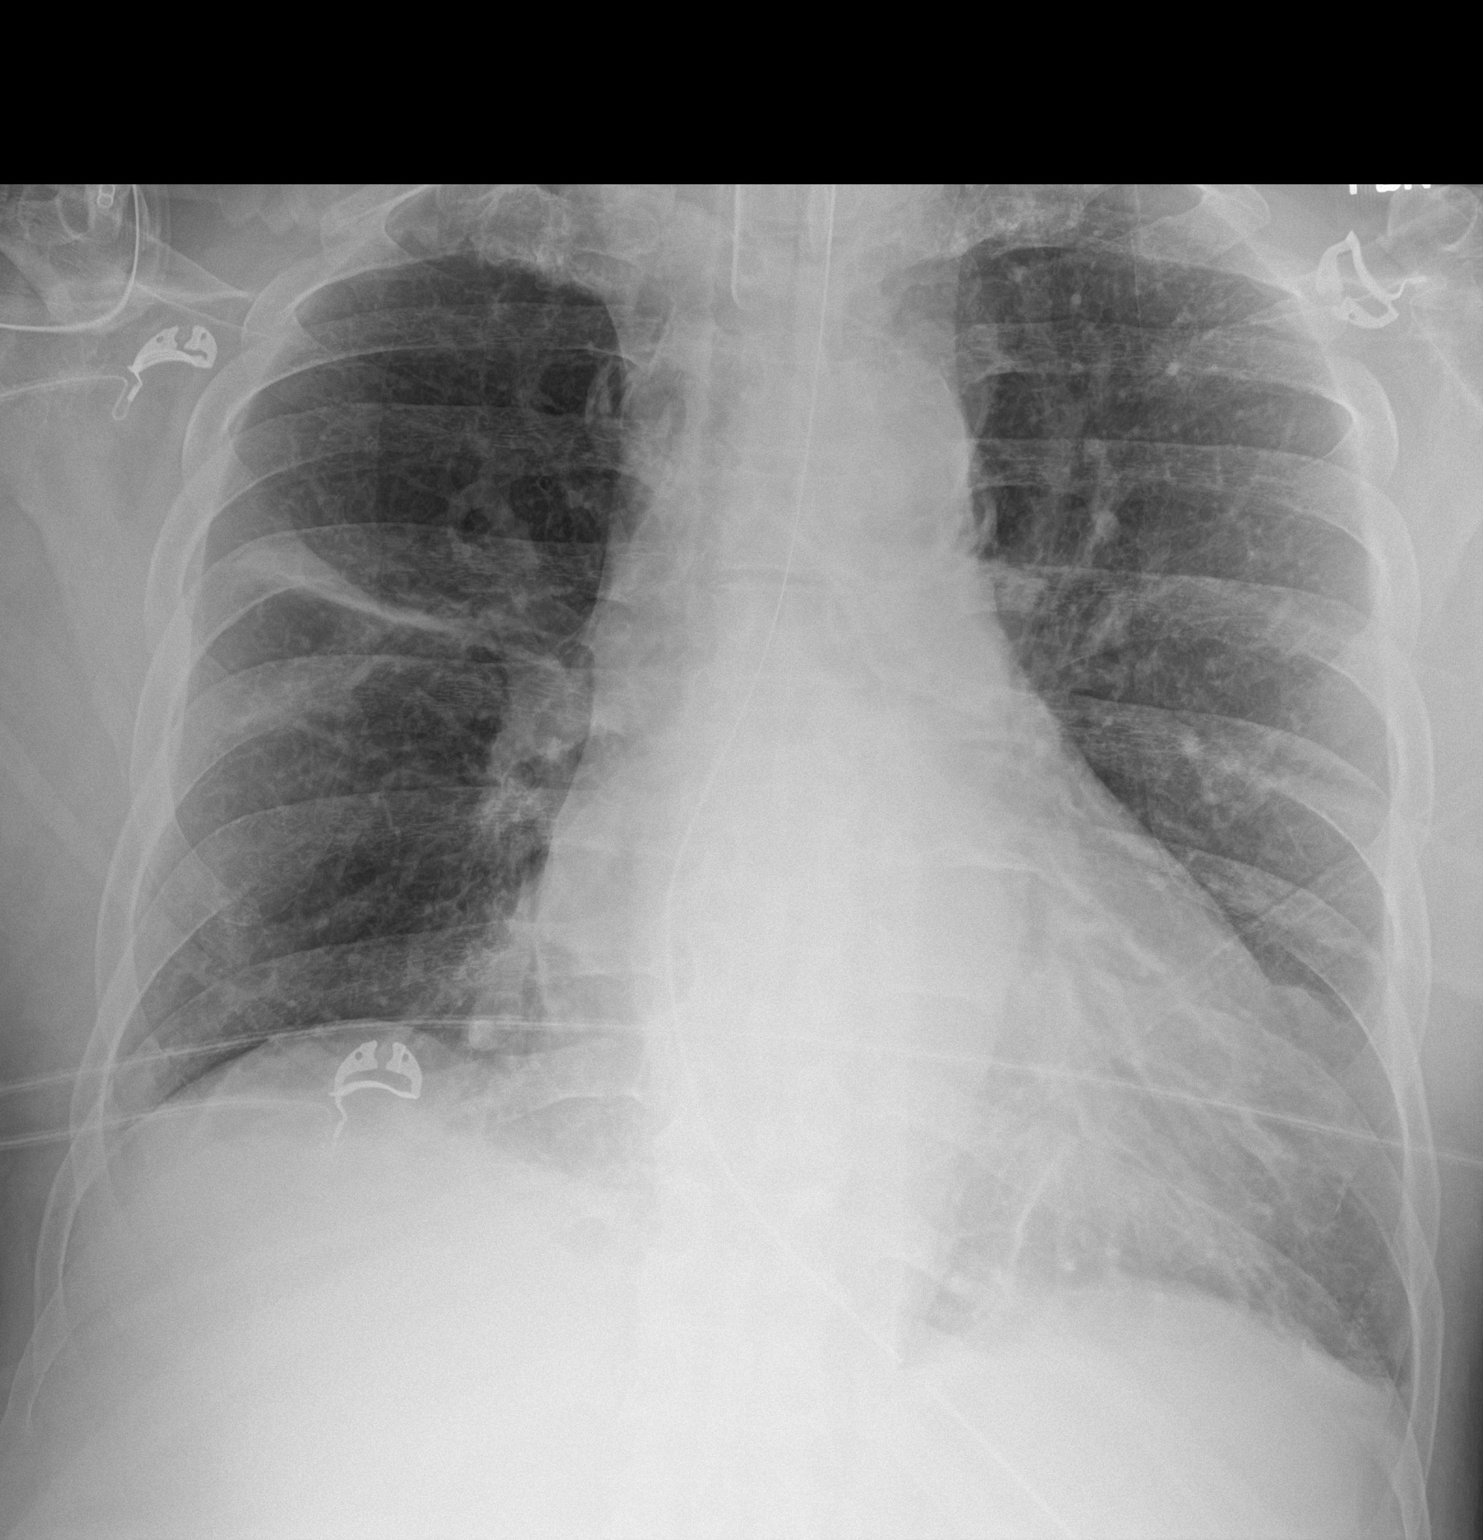

[1 of 1 positions shown; findings below may reference images not displayed]

FINDINGS: Endotracheal tube is stable, 6.5 cm above the carina. The side port
of the NG tube is at the GE junction.

The heart is enlarged. There is no significant edema. Small
effusions are suspected. Fluid or atelectasis is present along the
minor fissure. Mild bibasilar atelectasis is present. No other
significant airspace consolidation is present.
IMPRESSION: 1. Support apparatus is stable.
2. Increasing small bilateral pleural effusions and associated
atelectasis.

## 2018-11-10 NOTE — Telephone Encounter (Signed)
Transition Care Management Follow-up Telephone Call  Date of discharge and from where: 11/08/2018 - [facility]  Call placed to [phone] ( number provided by patient) and a message was left requesting the patient return the call to this CM # [phone].

## 2018-11-10 NOTE — Telephone Encounter (Signed)
Patient called to let [name] know of his new number [phone]

## 2018-11-12 NOTE — Telephone Encounter (Signed)
Call returned to the patient to check on him. Message left with CM call back # [phone]

## 2018-11-13 IMAGING — DX DG CHEST 1V PORT
1 series · 1 of 1 positions shown · non-contrast
Comparison: 11/08/2017 .

CLINICAL DATA: Shortness of breath.

EXAM:
PORTABLE CHEST 1 VIEW

[chest ap]
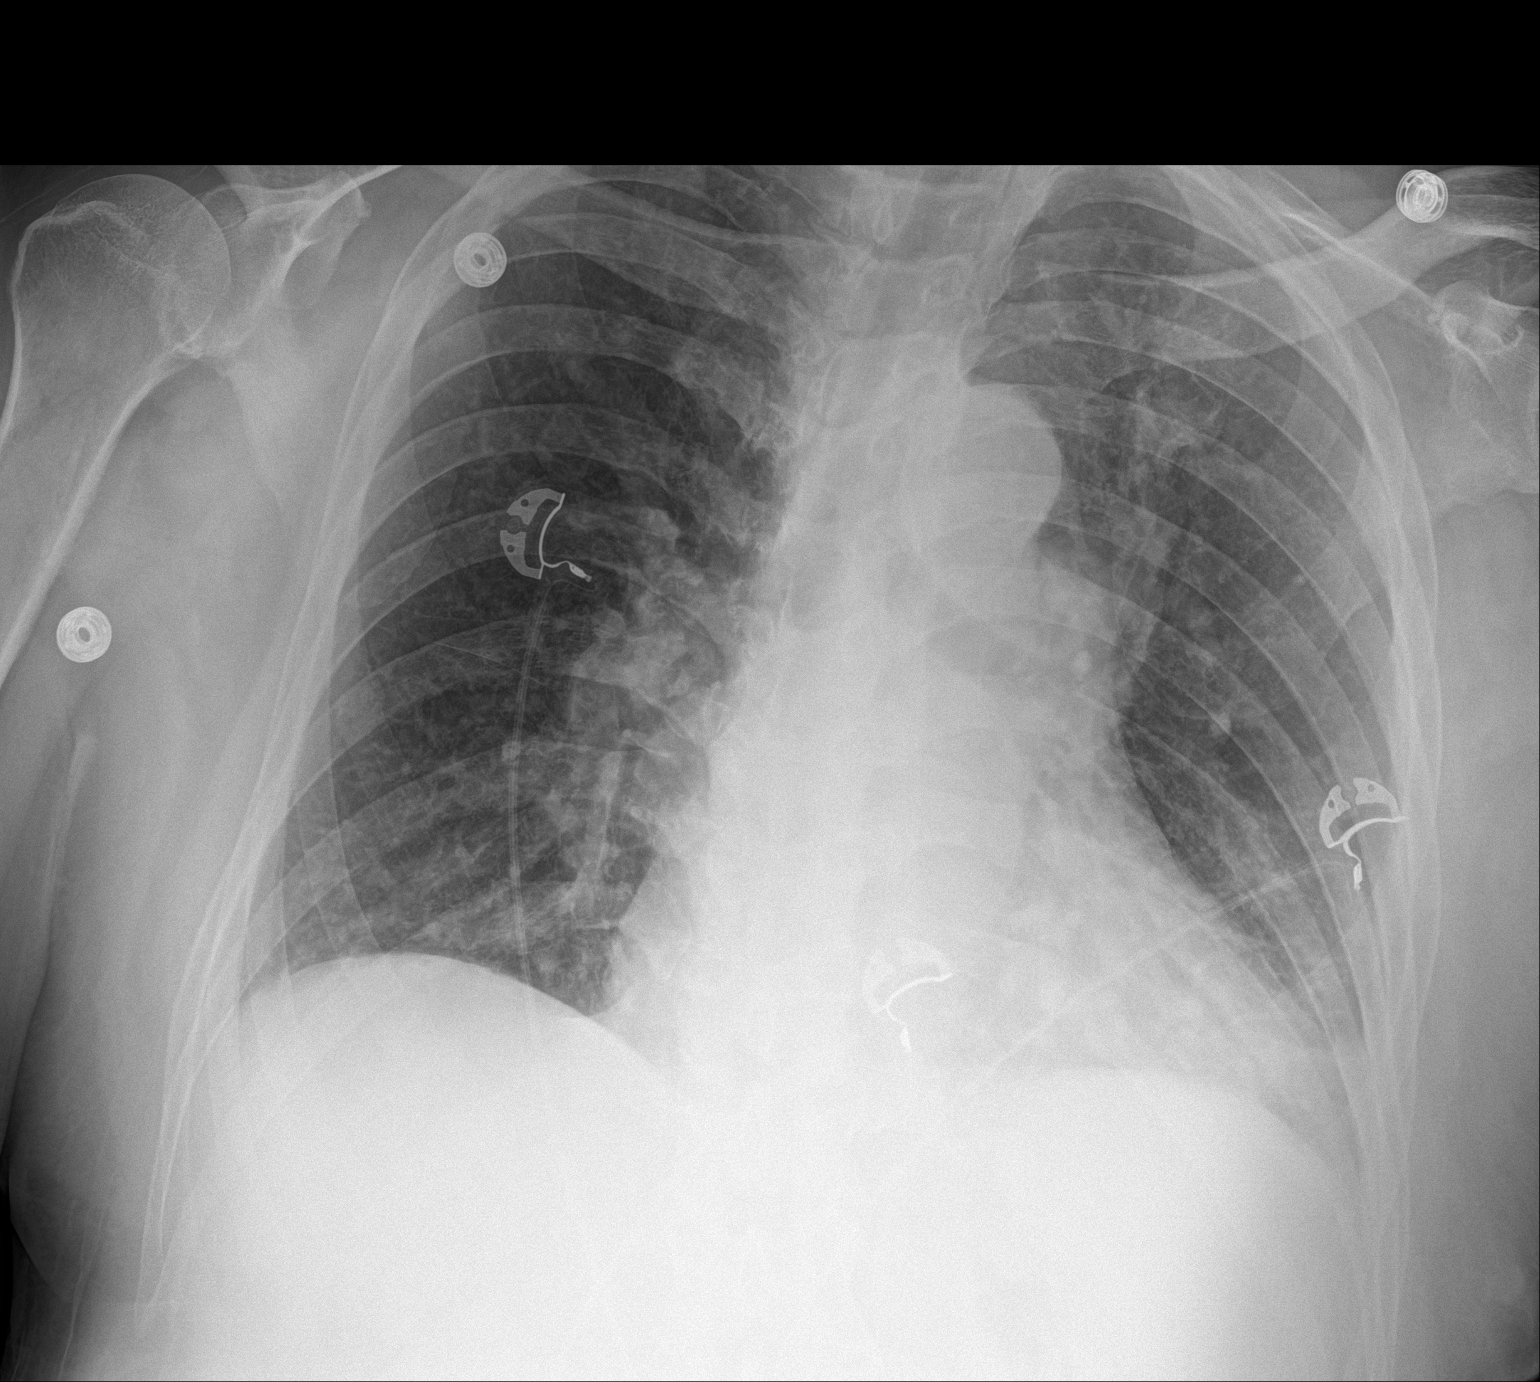

[1 of 1 positions shown; findings below may reference images not displayed]

FINDINGS: Interim extubation and removal of NG tube. Cardiomegaly with normal
pulmonary vascularity. Mild left base atelectasis/infiltrate. No
prominent pleural effusion. No pneumothorax.
IMPRESSION: 1.  Interim extubation removal of NG tube .

2. Low lung volumes with mild left base atelectasis/infiltrate.

3. Cardiomegaly with normal pulmonary vascularity .

## 2018-11-15 ENCOUNTER — Telehealth: Payer: Self-pay

## 2018-11-15 ENCOUNTER — Encounter: Payer: Medicaid Other | Admitting: Gastroenterology

## 2018-11-15 NOTE — Telephone Encounter (Signed)
Attempted to contact the patient to remind him of his appointment tomorrow at [facility]  - 11/16/18 @ 1350. Call placed to # [phone] and a message was left requesting a call back to this CM # [phone].

## 2018-11-16 ENCOUNTER — Inpatient Hospital Stay (HOSPITAL_COMMUNITY): Admit: 2018-11-16 | Discharge: 2018-11-16 | Disposition: A | Discharge status: Home / Self Care | Payer: Medicaid Other

## 2018-11-16 ENCOUNTER — Ambulatory Visit: Payer: Medicaid Other | Admitting: Family Medicine

## 2018-11-18 IMAGING — DX DG ABDOMEN ACUTE W/ 1V CHEST
3 series · 3 of 3 positions shown · non-contrast
Comparison: Chest 11/10/2017

CLINICAL DATA: Mid abdominal pain starting yesterday. Head
congestion. No bowel movement since hospital discharge on [REDACTED].

EXAM:
DG ABDOMEN ACUTE W/ 1V CHEST

[chest pa]
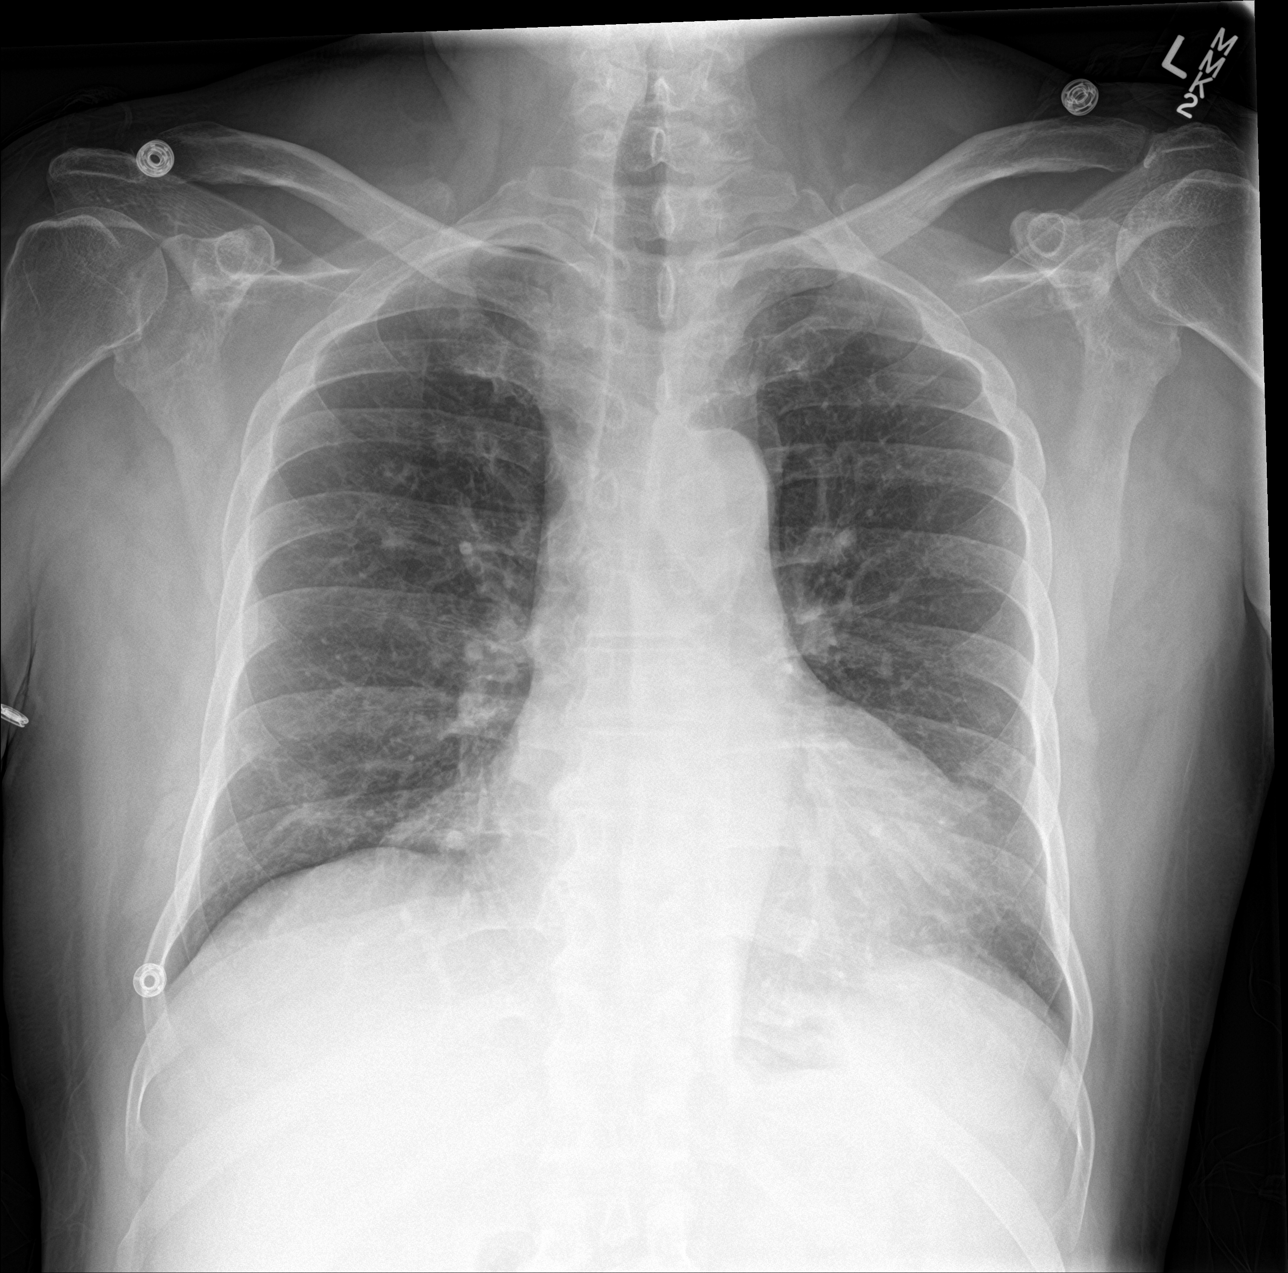

[abdomen erect]
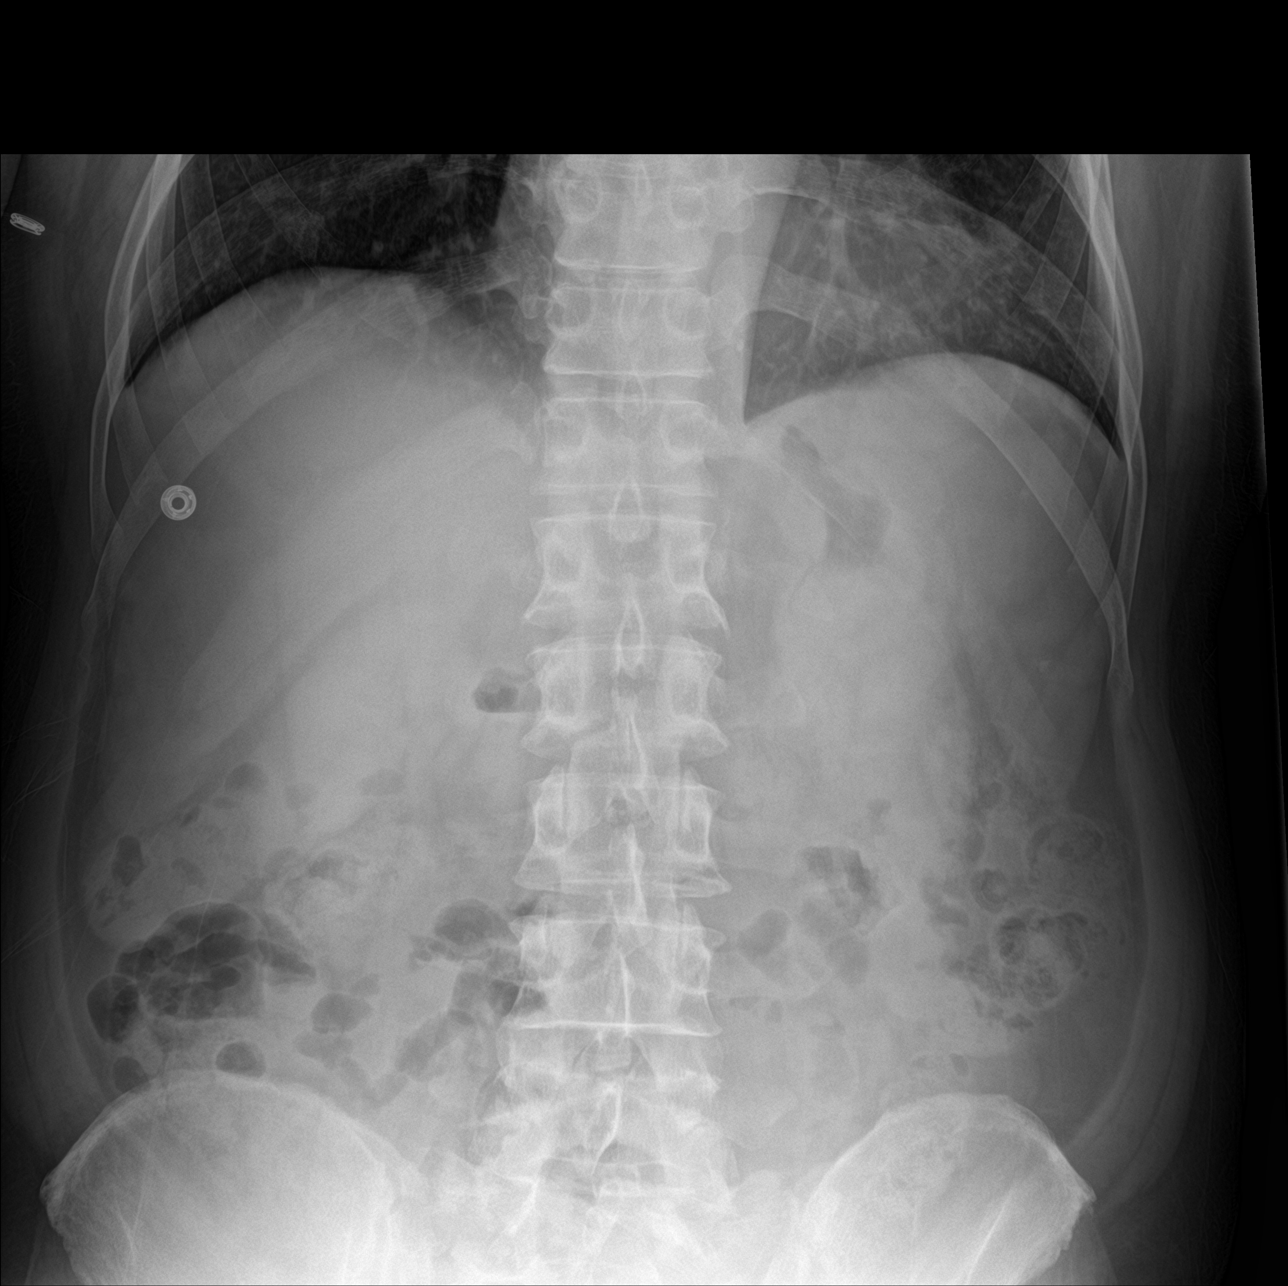

[abdomen supine]
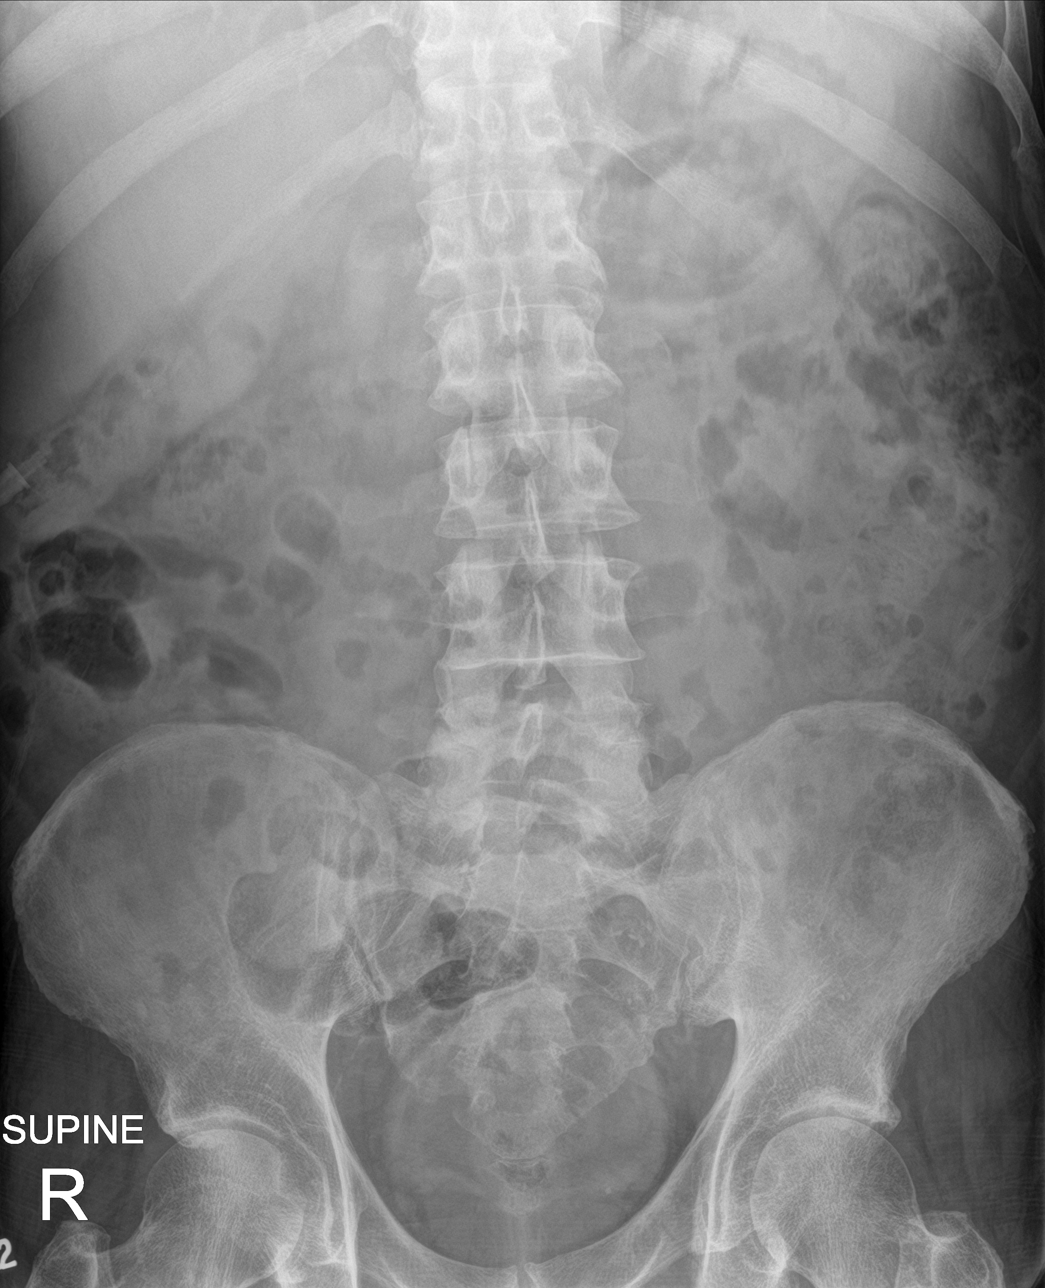

[3 of 3 positions shown; findings below may reference images not displayed]

FINDINGS: Mild cardiac enlargement. No pulmonary vascular congestion or edema.
No focal consolidation. No blunting of costophrenic angles. No
pneumothorax. Calcification of the aorta. Degenerative changes in
the spine and shoulders.

Gas and stool throughout the colon. No small or large bowel
distention. No free intra-abdominal air. No abnormal air-fluid
levels. No radiopaque stones. Visualized bones appear intact.
IMPRESSION: No evidence of active pulmonary disease. Mild cardiac enlargement.
Nonobstructive bowel gas pattern.

## 2018-11-25 ENCOUNTER — Encounter (HOSPITAL_COMMUNITY): Payer: Self-pay | Admitting: Cardiology

## 2018-12-19 IMAGING — DX DG CHEST 2V
2 series · 2 of 2 positions shown · non-contrast
Comparison: Single-view of the chest 11/15/2017. PA and lateral
chest 09/18/2016.

CLINICAL DATA: Chest pain since yesterday.

EXAM:
CHEST  2 VIEW

[w chest pa]
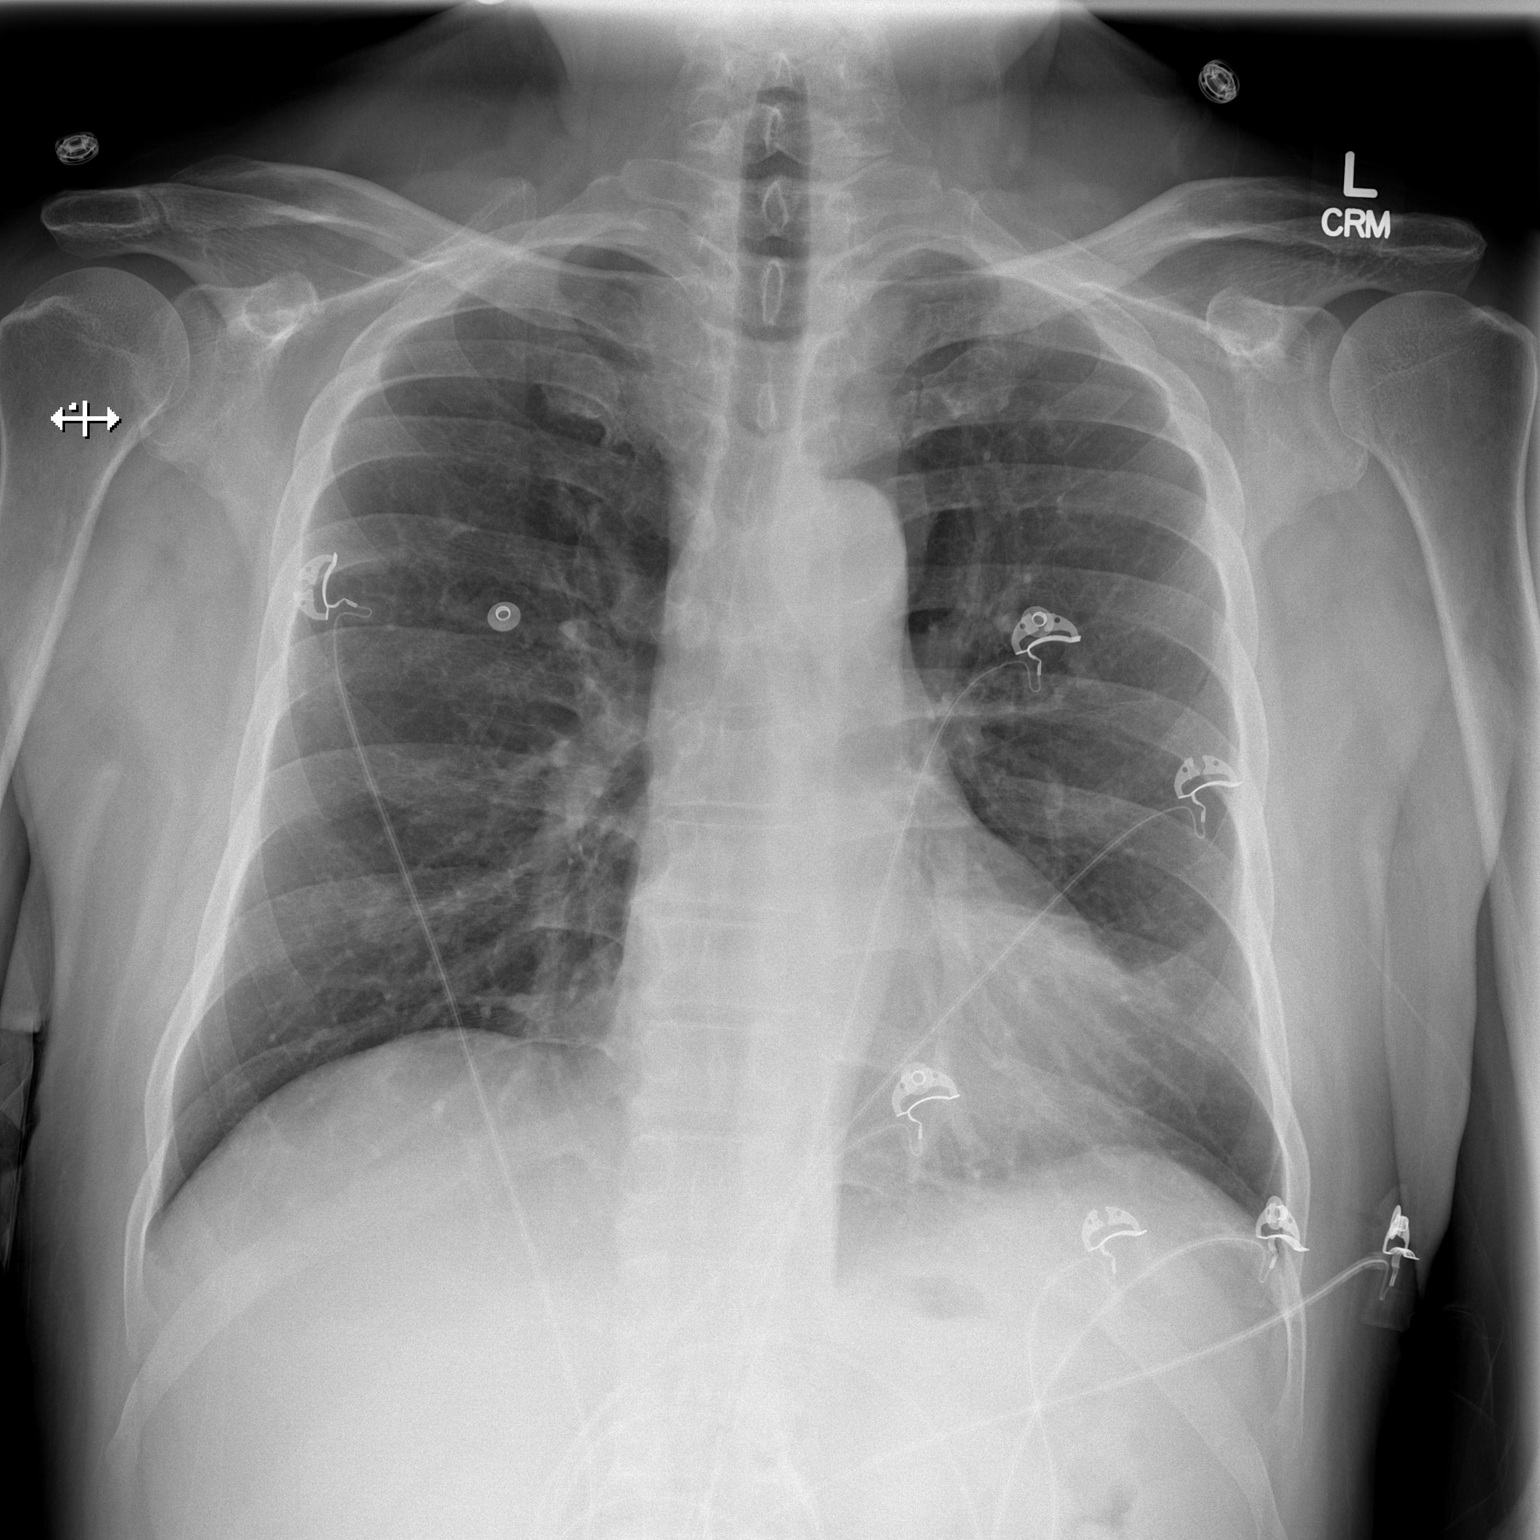

[w chest lat]
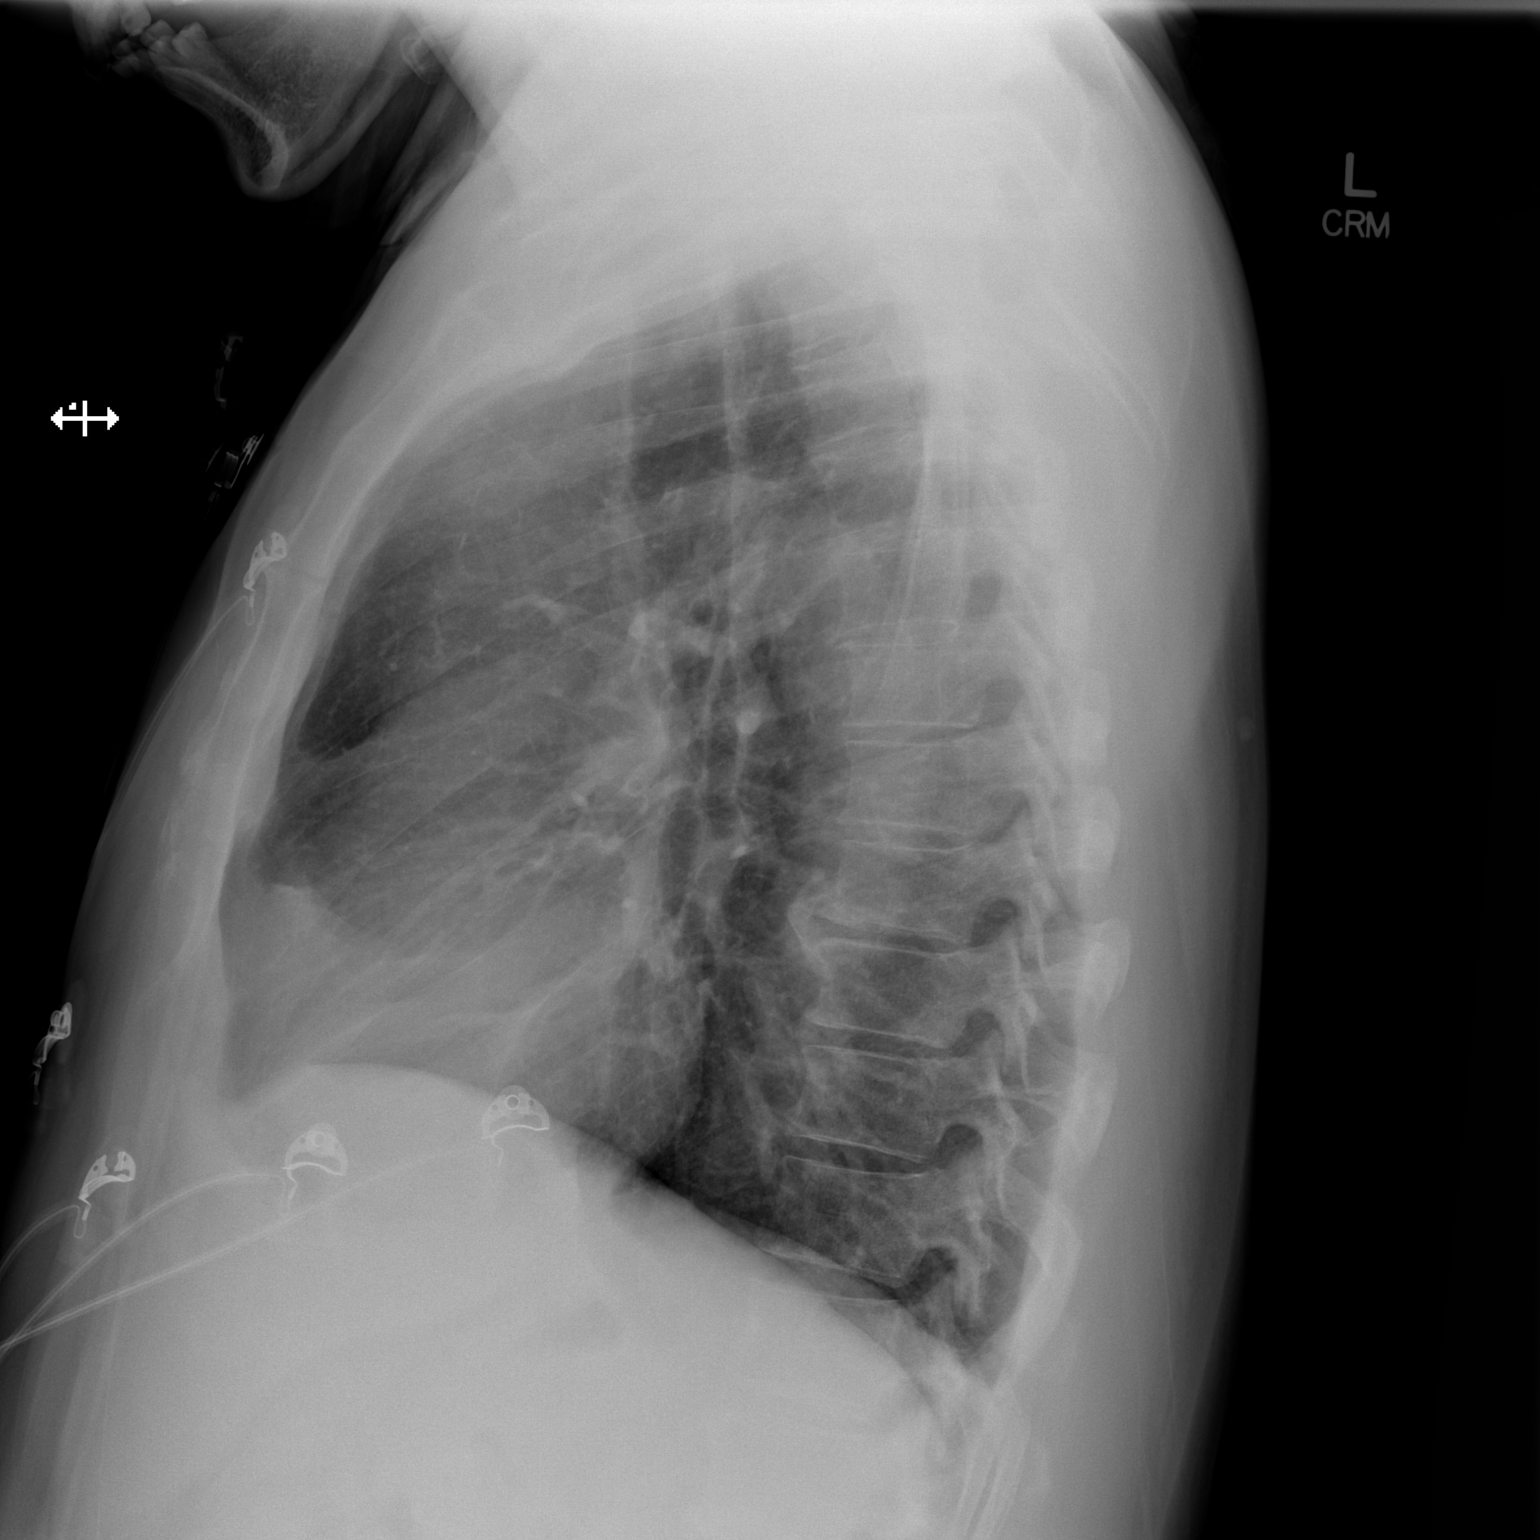

[2 of 2 positions shown; findings below may reference images not displayed]

FINDINGS: The lungs are clear. Heart size is normal. No pneumothorax or
pleural effusion. Aortic atherosclerosis noted. No acute bony
abnormality.
IMPRESSION: No acute disease.

Atherosclerosis.

## 2018-12-19 IMAGING — CT CT ANGIO CHEST
2 of 8 series · 19 of 46 positions shown · IV contrast (OMNI)
Comparison: 08/05/2017

CLINICAL DATA: Acute severe chest pain, history of atrial
fibrillation

EXAM:
CT ANGIOGRAPHY CHEST WITH CONTRAST
TECHNIQUE: Multidetector CT imaging of the chest was performed using the
standard protocol during bolus administration of intravenous
contrast. Multiplanar CT image reconstructions and MIPs were
obtained to evaluate the vascular anatomy.
CONTRAST:  80 cc Isovue 370

[Series 6: thins · axial · 0.67mm/px · z∈[+1215,+1495]mm · 16 of 309 slices shown]
[im 15/309  lung]
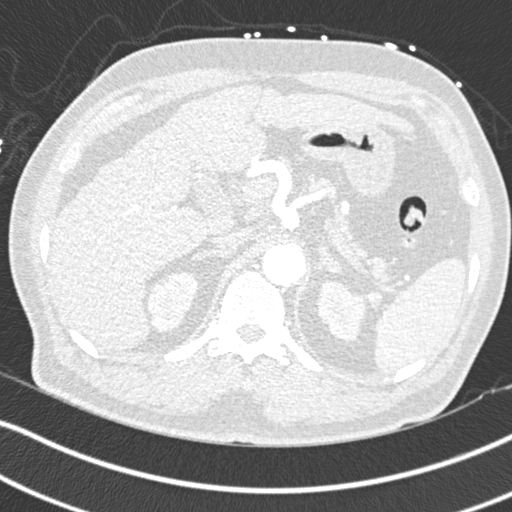
[im 29/309  soft-tissue]
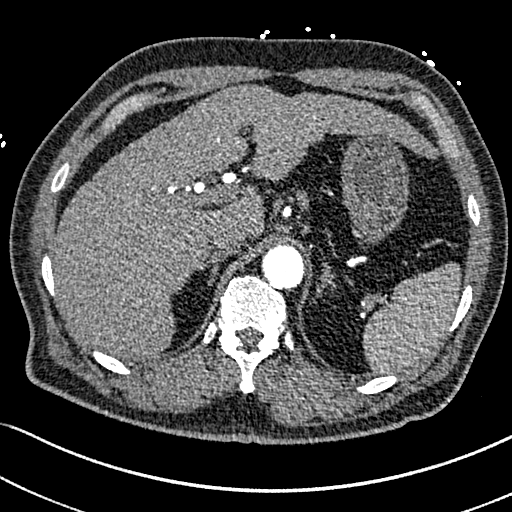
[im 57/309  lung]
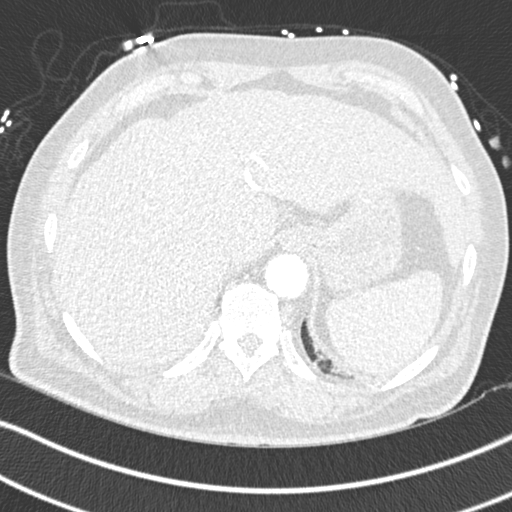
[im 71/309  soft-tissue]
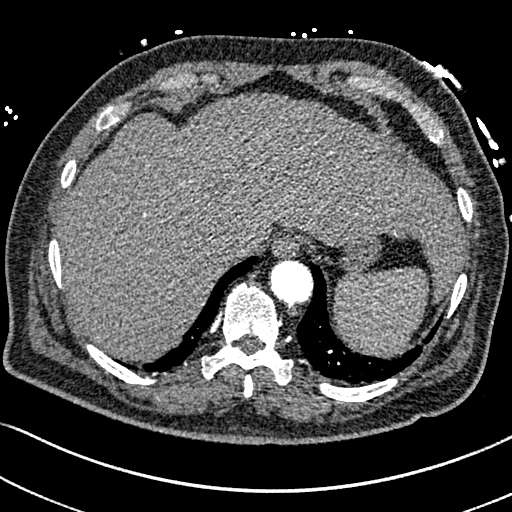
[im 85/309  lung]
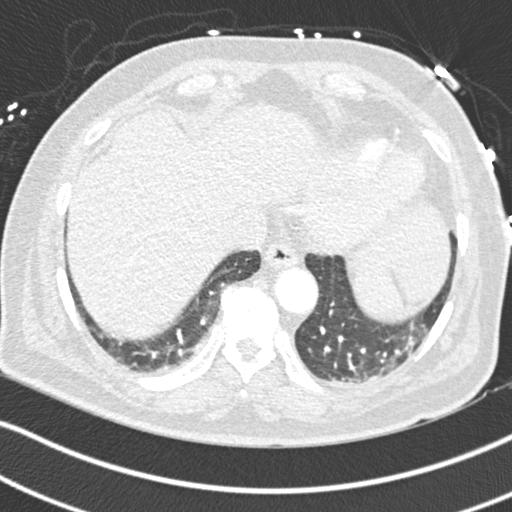
[im 113/309  soft-tissue]
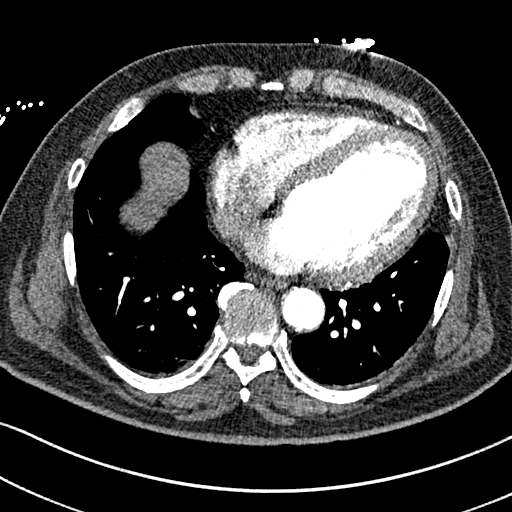
[im 127/309  lung]
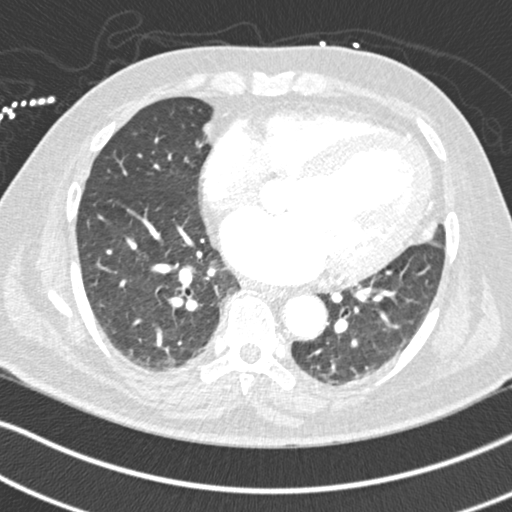
[im 141/309  soft-tissue]
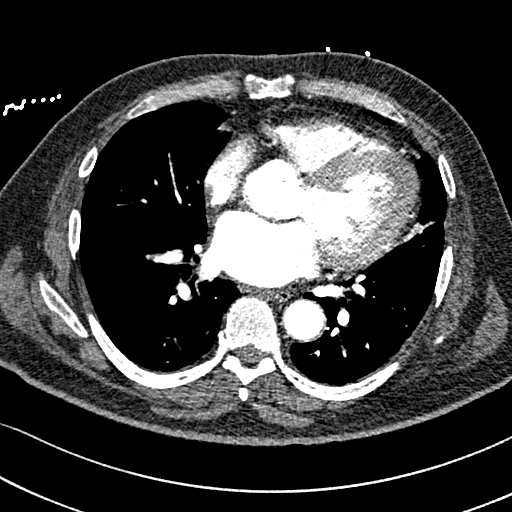
[im 169/309  lung]
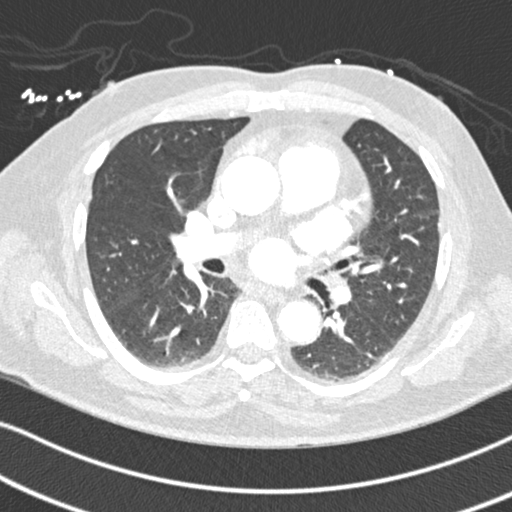
[im 183/309  soft-tissue]
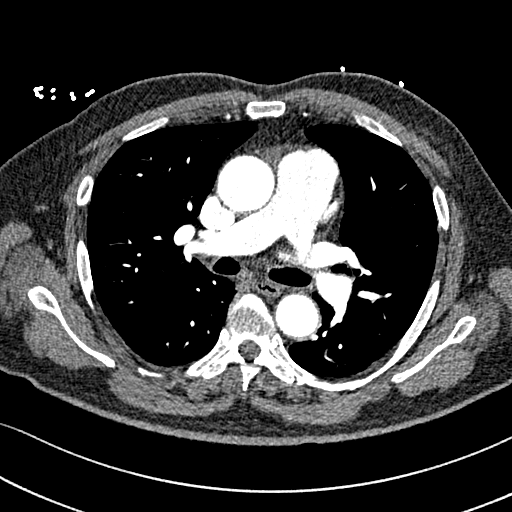
[im 197/309  lung]
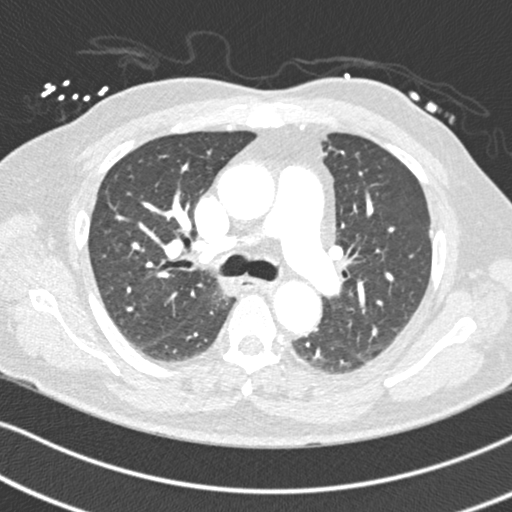
[im 225/309  soft-tissue]
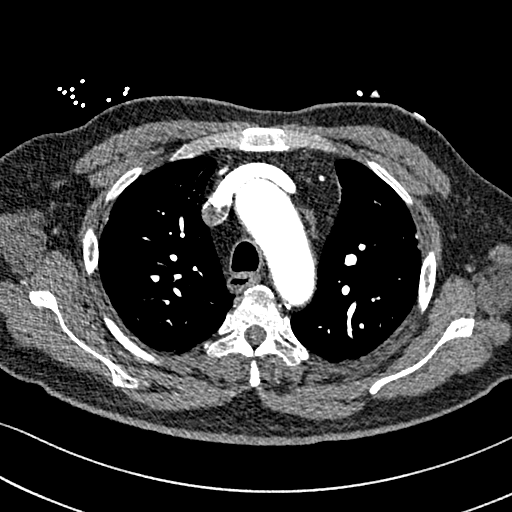
[im 239/309  lung]
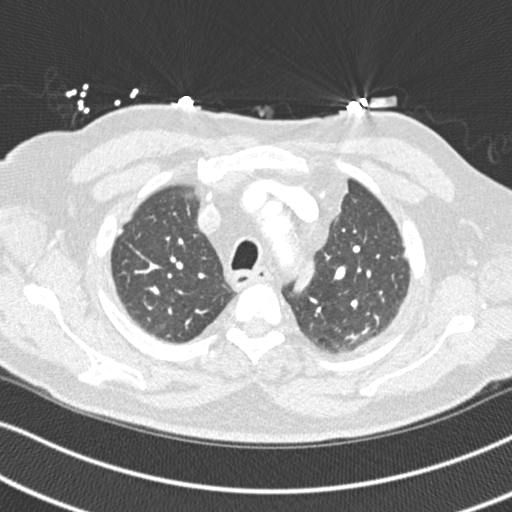
[im 253/309  soft-tissue]
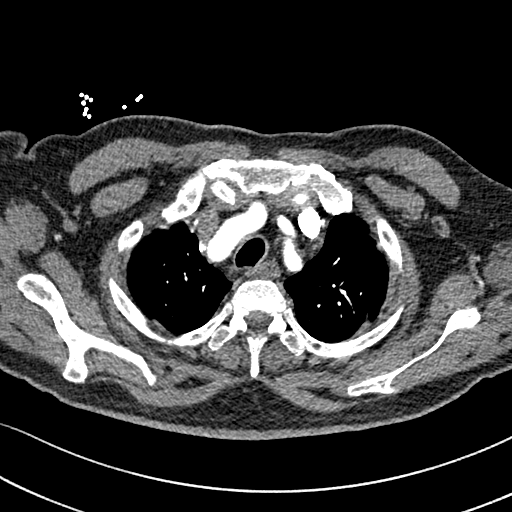
[im 281/309  lung]
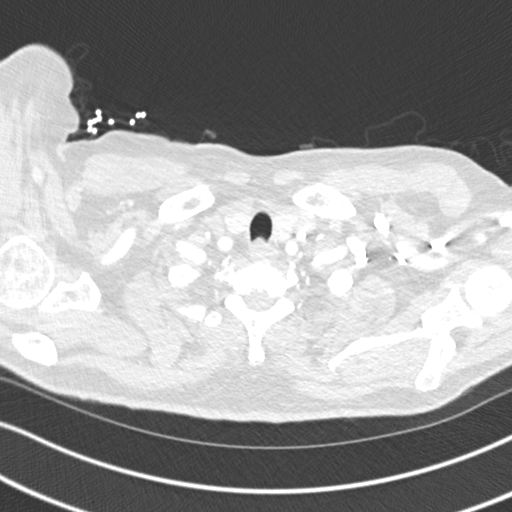
[im 295/309  soft-tissue]
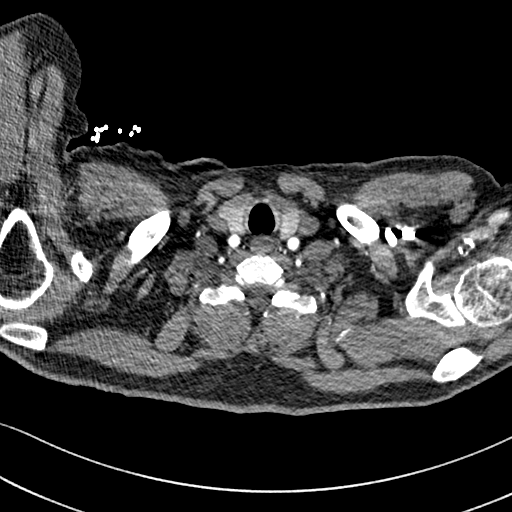

[Series 8: coronal mpr · coronal · 0.60mm/px · 3 of 130 slices shown]
[im 33/130  soft-tissue]
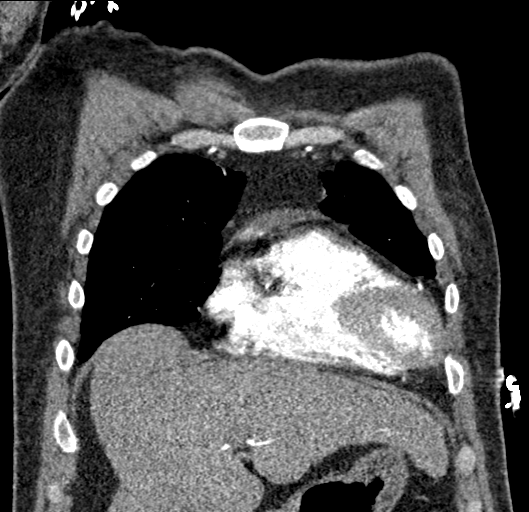
[im 65/130  soft-tissue]
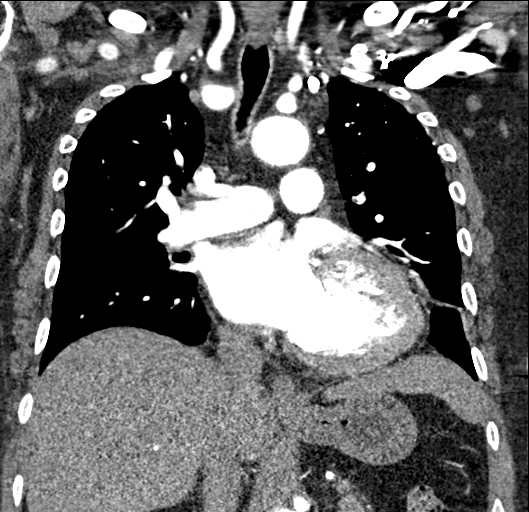
[im 97/130  soft-tissue]
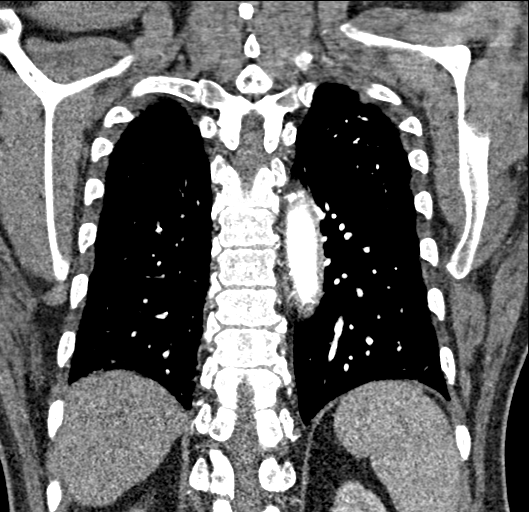

[19 of 46 positions shown; findings below may reference images not displayed]

FINDINGS: Cardiovascular: Pulmonary arteries are well-visualized and appear
patent. No significant filling defect or pulmonary embolus
demonstrated by CTA.

Minor thoracic aortic atherosclerosis. Patent 3 vessel arch anatomy.
Major branch vessels are mildly tortuous. No mediastinal hemorrhage
or hematoma. No aneurysm or dissection. Mild cardiac enlargement,
unchanged. No pericardial effusion. Native coronary atherosclerosis
noted

Mediastinum/Nodes: No enlarged mediastinal, hilar, or axillary lymph
nodes. Thyroid gland, trachea, and esophagus demonstrate no
significant findings.

Lungs/Pleura: Minor lingula and bibasilar dependent atelectasis. No
focal pneumonia, collapse or consolidation. Negative for edema or
interstitial process. No pleural abnormality, pleural effusion or
pneumothorax. Trachea and central airways are patent.

Upper Abdomen: Nodularity to the hepatic surface compatible with
hepatic cirrhosis. No acute upper abdominal finding.

Musculoskeletal: Degenerative changes noted of the spine. No acute
osseous finding.

Review of the MIP images confirms the above findings.
IMPRESSION: Negative for significant acute pulmonary embolus by CTA.

Cardiomegaly without CHF

Hepatic cirrhosis

Aortic Atherosclerosis ([id number]).

## 2018-12-21 ENCOUNTER — Other Ambulatory Visit: Payer: Self-pay | Admitting: Family Medicine

## 2018-12-21 DIAGNOSIS — R0981 Nasal congestion: Secondary | ICD-10-CM

## 2018-12-28 ENCOUNTER — Telehealth: Payer: Self-pay | Admitting: Family Medicine
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
## 2018-12-28 NOTE — Telephone Encounter (Signed)
Pt called requesting [name] give him a call back, please follow up

## 2018-12-29 ENCOUNTER — Telehealth: Payer: Self-pay | Admitting: Family Medicine
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
## 2018-12-29 NOTE — Telephone Encounter (Signed)
Patient called because he would like to speak to you. Patient says he is on the way and wanted to ensure you would be in. Please follow up.

## 2019-01-13 ENCOUNTER — Ambulatory Visit: Payer: Medicaid Other | Admitting: Family Medicine

## 2019-01-14 ENCOUNTER — Telehealth: Payer: Self-pay
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
## 2019-01-14 NOTE — Telephone Encounter (Signed)
Attempted to contact patient to check on him and remind him of upcoming appointment at [facility]  - 01/17/2019 @ 1510.  Call placed to [phone] and a message was left requesting a call back to this CM # [phone]

## 2019-01-17 ENCOUNTER — Telehealth: Payer: Self-pay

## 2019-01-17 ENCOUNTER — Ambulatory Visit: Payer: Medicaid Other | Attending: Family Medicine | Admitting: Family Medicine

## 2019-01-17 ENCOUNTER — Encounter: Payer: Self-pay | Admitting: Family Medicine

## 2019-01-17 VITALS — BP 112/72 | HR 77 | Temp 97.6°F | Ht 71.0 in | Wt 179.4 lb

## 2019-01-17 DIAGNOSIS — Z7901 Long term (current) use of anticoagulants: Secondary | ICD-10-CM | POA: Diagnosis not present

## 2019-01-17 DIAGNOSIS — I4892 Unspecified atrial flutter: Secondary | ICD-10-CM | POA: Diagnosis not present

## 2019-01-17 DIAGNOSIS — M1990 Unspecified osteoarthritis, unspecified site: Secondary | ICD-10-CM | POA: Diagnosis not present

## 2019-01-17 DIAGNOSIS — B1920 Unspecified viral hepatitis C without hepatic coma: Secondary | ICD-10-CM | POA: Diagnosis not present

## 2019-01-17 DIAGNOSIS — Z79899 Other long term (current) drug therapy: Secondary | ICD-10-CM | POA: Diagnosis not present

## 2019-01-17 DIAGNOSIS — Z794 Long term (current) use of insulin: Secondary | ICD-10-CM | POA: Insufficient documentation

## 2019-01-17 DIAGNOSIS — I130 Hypertensive heart and chronic kidney disease with heart failure and stage 1 through stage 4 chronic kidney disease, or unspecified chronic kidney disease: Secondary | ICD-10-CM | POA: Diagnosis not present

## 2019-01-17 DIAGNOSIS — F1410 Cocaine abuse, uncomplicated: Secondary | ICD-10-CM | POA: Diagnosis not present

## 2019-01-17 DIAGNOSIS — E118 Type 2 diabetes mellitus with unspecified complications: Secondary | ICD-10-CM | POA: Diagnosis not present

## 2019-01-17 DIAGNOSIS — E7800 Pure hypercholesterolemia, unspecified: Secondary | ICD-10-CM | POA: Diagnosis not present

## 2019-01-17 DIAGNOSIS — I482 Chronic atrial fibrillation, unspecified: Secondary | ICD-10-CM | POA: Diagnosis not present

## 2019-01-17 DIAGNOSIS — R296 Repeated falls: Secondary | ICD-10-CM | POA: Insufficient documentation

## 2019-01-17 DIAGNOSIS — K219 Gastro-esophageal reflux disease without esophagitis: Secondary | ICD-10-CM | POA: Insufficient documentation

## 2019-01-17 DIAGNOSIS — E1140 Type 2 diabetes mellitus with diabetic neuropathy, unspecified: Secondary | ICD-10-CM | POA: Insufficient documentation

## 2019-01-17 DIAGNOSIS — N183 Chronic kidney disease, stage 3 unspecified: Secondary | ICD-10-CM

## 2019-01-17 DIAGNOSIS — E1122 Type 2 diabetes mellitus with diabetic chronic kidney disease: Secondary | ICD-10-CM | POA: Insufficient documentation

## 2019-01-17 DIAGNOSIS — Z888 Allergy status to other drugs, medicaments and biological substances status: Secondary | ICD-10-CM | POA: Diagnosis not present

## 2019-01-17 DIAGNOSIS — I5042 Chronic combined systolic (congestive) and diastolic (congestive) heart failure: Secondary | ICD-10-CM | POA: Insufficient documentation

## 2019-01-17 DIAGNOSIS — I110 Hypertensive heart disease with heart failure: Secondary | ICD-10-CM | POA: Diagnosis not present

## 2019-01-17 DIAGNOSIS — R600 Localized edema: Secondary | ICD-10-CM

## 2019-01-17 DIAGNOSIS — I10 Essential (primary) hypertension: Secondary | ICD-10-CM

## 2019-01-17 LAB — GLUCOSE, POCT (MANUAL RESULT ENTRY): POC Glucose: 154 mg/dl — AB (ref 70–99)

## 2019-01-17 LAB — POCT GLYCOSYLATED HEMOGLOBIN (HGB A1C): HbA1c, POC (controlled diabetic range): 7.5 % — AB (ref 0.0–7.0)

## 2019-01-17 NOTE — Patient Instructions (Signed)
Edema    Edema is when you have too much fluid in your body or under your skin. Edema may make your legs, feet, and ankles swell up. Swelling is also common in looser tissues, like around your eyes. This is a common condition. It gets more common as you get older. There are many possible causes of edema. Eating too much salt (sodium) and being on your feet or sitting for a long time can cause edema in your legs, feet, and ankles. Hot weather may make edema worse.  Edema is usually painless. Your skin may look swollen or shiny.  Follow these instructions at home:  · Keep the swollen body part raised (elevated) above the level of your heart when you are sitting or lying down.  · Do not sit still or stand for a long time.  · Do not wear tight clothes. Do not wear garters on your upper legs.  · Exercise your legs. This can help the swelling go down.  · Wear elastic bandages or support stockings as told by your doctor.  · Eat a low-salt (low-sodium) diet to reduce fluid as told by your doctor.  · Depending on the cause of your swelling, you may need to limit how much fluid you drink (fluid restriction).  · Take over-the-counter and prescription medicines only as told by your doctor.  Contact a doctor if:  · Treatment is not working.  · You have heart, liver, or kidney disease and have symptoms of edema.  · You have sudden and unexplained weight gain.  Get help right away if:  · You have shortness of breath or chest pain.  · You cannot breathe when you lie down.  · You have pain, redness, or warmth in the swollen areas.  · You have heart, liver, or kidney disease and get edema all of a sudden.  · You have a fever and your symptoms get worse all of a sudden.  Summary  · Edema is when you have too much fluid in your body or under your skin.  · Edema may make your legs, feet, and ankles swell up. Swelling is also common in looser tissues, like around your eyes.  · Raise (elevate) the swollen body part above the level of your  heart when you are sitting or lying down.  · Follow your doctor's instructions about diet and how much fluid you can drink (fluid restriction).  This information is not intended to replace advice given to you by your health care provider. Make sure you discuss any questions you have with your health care provider.  Document Released: 05/26/2008 Document Revised: 12/26/2016 Document Reviewed: 12/26/2016  Elsevier Interactive Patient Education © 2019 Elsevier Inc.
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
## 2019-01-17 NOTE — Telephone Encounter (Signed)
Met with the patient when he was in the clinic today.  He stated that he is still living with a friend and the housing situation is stable at this time.  He is still considering contacting [facility] to inquire about the wait list.   call placed to [facility], spoke to Nixa who scheduled the patient for an assessment on 02/02/2019 @ 1130. Instructed her to confirm the patient's address when scheduling the assessment as he has moved frequently.   Explained the community paramedicine program to the patient and he is agreeable to having a referral made.   He also is requesting PCS.  Application given to Doloris Hall, CMA.   Bus passes provided to patient

## 2019-01-18 ENCOUNTER — Encounter: Payer: Self-pay | Admitting: Family Medicine

## 2019-01-18 LAB — CMP14+EGFR
ALT: 21 IU/L (ref 0–44)
AST: 29 IU/L (ref 0–40)
Albumin/Globulin Ratio: 0.8 — ABNORMAL LOW (ref 1.2–2.2)
Albumin: 3.5 g/dL — ABNORMAL LOW (ref 3.8–4.9)
Alkaline Phosphatase: 72 IU/L (ref 39–117)
BUN/Creatinine Ratio: 10 (ref 9–20)
BUN: 16 mg/dL (ref 6–24)
Bilirubin Total: 0.9 mg/dL (ref 0.0–1.2)
CO2: 24 mmol/L (ref 20–29)
Calcium: 9.5 mg/dL (ref 8.7–10.2)
Chloride: 97 mmol/L (ref 96–106)
Creatinine, Ser: 1.64 mg/dL — ABNORMAL HIGH (ref 0.76–1.27)
GFR calc Af Amer: 52 mL/min/{1.73_m2} — ABNORMAL LOW (ref >59)
GFR calc non Af Amer: 45 mL/min/{1.73_m2} — ABNORMAL LOW (ref >59)
Globulin, Total: 4.4 g/dL (ref 1.5–4.5)
Glucose: 161 mg/dL — ABNORMAL HIGH (ref 65–99)
Potassium: 4 mmol/L (ref 3.5–5.2)
Sodium: 138 mmol/L (ref 134–144)
Total Protein: 7.9 g/dL (ref 6.0–8.5)

## 2019-01-19 ENCOUNTER — Telehealth: Payer: Self-pay

## 2019-01-19 NOTE — Telephone Encounter (Signed)
Prescriptions for quad cane and compression stockings faxed to [facility].

## 2019-01-19 NOTE — Telephone Encounter (Signed)
Call received from [place] with [facility]. She reported that medicaid will not pay for an other cane for patient. He received one in 05/2018 and they will only pay for one every 3 years. He would need to private pay.

## 2019-01-19 NOTE — Telephone Encounter (Signed)
Community paramedicine referral faxed to [facility]

## 2019-01-21 ENCOUNTER — Telehealth: Payer: Self-pay

## 2019-01-21 NOTE — Telephone Encounter (Signed)
-----   Message from Charlott Rakes, MD sent at 01/18/2019  4:53 PM EST ----- Labs are stable

## 2019-01-21 NOTE — Telephone Encounter (Signed)
Patient was called and informed of lab results. 

## 2019-01-21 NOTE — Telephone Encounter (Signed)
Call placed to [facility] and informed provide her with patient's current address and phone #.[phone]

## 2019-01-21 NOTE — Telephone Encounter (Signed)
Attempted to contact patient to inquire if he was contacted by [facility] and notified that medicaid will not pay for the cane and to also inquire if they spoke to him about the compression stockings. Call placed to # [phone] and a message was left with CM call back # [phone].

## 2019-01-24 ENCOUNTER — Telehealth (HOSPITAL_COMMUNITY): Payer: Self-pay

## 2019-01-24 ENCOUNTER — Telehealth: Payer: Self-pay | Admitting: Family Medicine

## 2019-01-24 NOTE — Telephone Encounter (Signed)
Pt called to request a call back when possible, please follow up

## 2019-01-24 NOTE — Telephone Encounter (Signed)
Left message with Abdulahi to call back to schedule initial home visit. No answer, no return of call.

## 2019-01-25 NOTE — Telephone Encounter (Signed)
Call returned to patient. He said that he received a call from [facility] care and needs to call them back to schedule PCS assessment.  He stated that the swelling of his legs is finally decreased.   This CM informed him that [facility] called and stated that the cane is not covered by medicaid and he would need to pay privately for this.  He said that he has not heard anything about compression stockings.   This CM also reminded him that he has an appointment for a [facility] assessment on 02/02/2019 @ 1130.  [facility] will call him a few days prior to confirm the appointment. They have his current address and phone number.   This CM also reminded him that Nira Conn, EMT will be contacting him to schedule a home visit as part of the community paramedicine program.  Call placed to [facility] to check on order for compression stockings that was sent with same fax that included order for cane-  Faxed 01/19/2019. Spoke to [place] who stated that she could not locate the order for compression stockings. Then spoke to [facility] in referral support. He said that the order was received. He then noted that he will contact the retail store to check insurance coverage and then call the patient with the information. The patient's current phone number # [phone] was confirmed with [place].

## 2019-01-27 ENCOUNTER — Telehealth: Payer: Self-pay | Admitting: Family Medicine

## 2019-01-27 NOTE — Telephone Encounter (Signed)
Pt called in wanting to speak to Cheryll Dessert  was not in office pt asked to be called back  Please follow up

## 2019-01-28 NOTE — Telephone Encounter (Signed)
Call returned to patient [phone] and message was left requesting a call back to this CM # [phone]

## 2019-01-31 ENCOUNTER — Telehealth: Payer: Self-pay

## 2019-01-31 ENCOUNTER — Other Ambulatory Visit: Payer: Self-pay | Admitting: Family Medicine

## 2019-01-31 ENCOUNTER — Telehealth (HOSPITAL_COMMUNITY): Payer: Self-pay

## 2019-01-31 NOTE — Telephone Encounter (Signed)
Spoke with Brad [name] he states he is open to seeing me and wants me to call later this week to schedule a home visit.

## 2019-01-31 NOTE — Telephone Encounter (Signed)
Call placed to the patient and reminded him of his SCAT assessment on 01/02/2019 @ 1130 and he should be ready for pick up by 1030.  He stated that he understood.

## 2019-01-31 NOTE — Telephone Encounter (Signed)
Shan Levans with no answer. Left voicemail for him to call me back reference initial CHP visit.

## 2019-02-02 ENCOUNTER — Telehealth: Payer: Self-pay

## 2019-02-02 NOTE — Telephone Encounter (Signed)
Patient would like a prescription for Viagra.

## 2019-02-02 NOTE — Telephone Encounter (Signed)
Call received from the patient stating that he has been approved for SCAT - he is eligible to ride SCAT for  $1.50/ride  as well as the [place] bus for free. He also noted that he called Melissa at  [facility] and has an appointment there 02/08/2019 @ 1130. He is frustrated with his current housing situation noting that he has helped pay the utility bills in addition to his rent.  He is also requesting viagra.informed him that Doloris Hall, CMA has notified Dr Margarita Rana of this request.

## 2019-02-03 MED ORDER — SILDENAFIL CITRATE 50 MG PO TABS: 50.0000 mg | ORAL_TABLET | Freq: Every day | ORAL | 0 refills | Status: DC | PRN

## 2019-02-03 NOTE — Telephone Encounter (Signed)
Done

## 2019-02-03 NOTE — Telephone Encounter (Signed)
Call was placed to patient and messages states that person is unavailable at this time.

## 2019-02-07 ENCOUNTER — Telehealth (HOSPITAL_COMMUNITY): Payer: Self-pay

## 2019-02-07 ENCOUNTER — Other Ambulatory Visit (HOSPITAL_COMMUNITY): Payer: Self-pay

## 2019-02-07 ENCOUNTER — Other Ambulatory Visit: Payer: Self-pay

## 2019-02-07 DIAGNOSIS — E118 Type 2 diabetes mellitus with unspecified complications: Secondary | ICD-10-CM

## 2019-02-07 MED ORDER — GLUCOSE BLOOD VI STRP: ORAL_STRIP | 12 refills | Status: DC

## 2019-02-07 MED ORDER — ACCU-CHEK FASTCLIX LANCETS MISC: 12 refills | Status: DC

## 2019-02-07 MED ORDER — ACCU-CHEK GUIDE W/DEVICE KIT: 1.0000 | PACK | Freq: Three times a day (TID) | 0 refills | Status: DC

## 2019-02-07 NOTE — Progress Notes (Signed)
Paramedicine Encounter    Patient ID: Brad [name], male    DOB: 1961-02-16, 58 y.o.   MRN: [id number]   Patient Care Team: Charlott Rakes, MD as PCP - General (Family Medicine) Debara Pickett Nadean Corwin, MD as PCP - Cardiology (Cardiology) Charlott Rakes, MD (Family Medicine)  Patient Active Problem List   Diagnosis Date Noted  . Frequent falls 01/17/2019  . Severe mitral regurgitation   . Fall 11/01/2018  . Hyponatremia 10/31/2018  . Abdominal distention   . Acute systolic congestive heart failure ([place])   . Chronic atrial fibrillation   . Chronic hyponatremia 08/16/2018  . NSVT (nonsustained ventricular tachycardia) ([place])   . Concussion with loss of consciousness   . Scalp laceration   . Trauma   . Permanent atrial fibrillation   . Hypertensive heart disease   . Shortness of breath   . ACS (acute coronary syndrome) ([place]) 07/25/2018  . Pyogenic inflammation of bone ([place])   . Ankle swelling, right 04/30/2018  . Cirrhosis ([place]) 03/23/2018  . Right ankle pain 03/23/2018  . Acute respiratory failure with hypoxia ([place]) 11/06/2017  . Syncope 08/07/2017  . Acute on chronic combined systolic and diastolic heart failure ([place]) 07/09/2017  . Atrial flutter ([place]) 07/09/2017  . Pre-syncope 07/08/2017  . Neuropathy 05/08/2017  . Substance induced mood disorder ([place]) 10/06/2016  . CVA (cerebral vascular accident) ([place]) 09/18/2016  . Left sided numbness   . Homelessness 08/21/2016  . S/P ORIF (open reduction internal fixation) fracture 08/01/2016  . CAD (coronary artery disease), native coronary artery 07/30/2016  . Surgery, elective   . Insomnia 07/22/2016  . Acute diastolic heart failure ([place])   . NSTEMI (non-ST elevated myocardial infarction) ([place])   . Normocytic anemia 07/05/2016  . Thrombocytopenia ([place]) 07/05/2016  . AKI (acute kidney injury) ([place])   . Cocaine abuse ([place]) 07/02/2016  . Essential hypertension 07/01/2016  . Type 2 diabetes mellitus with complication, without  long-term current use of insulin ([place]) 07/01/2016  . Hypokalemia 07/01/2016  . CKD (chronic kidney disease) stage 3, GFR 30-59 ml/min (HCC) 07/01/2016  . Painful diabetic neuropathy ([place]) 07/01/2016  . Polysubstance abuse ([place]) 05/27/2016  . Chronic hepatitis C with cirrhosis ([place]) 05/27/2016  . Chronic diastolic congestive heart failure ([place]) 01/31/2016  . Depression 04/21/2012  . GERD (gastroesophageal reflux disease) 02/16/2012  . History of drug abuse ([place])   . Heroin addiction ([place]) 01/29/2012    Current Outpatient Medications:  .  amitriptyline (ELAVIL) 75 MG tablet, Take 1 tablet (75 mg total) by mouth at bedtime., Disp: 30 tablet, Rfl: 6 .  atorvastatin (LIPITOR) 40 MG tablet, Take 1 tablet (40 mg total) by mouth daily., Disp: 90 tablet, Rfl: 3 .  carvedilol (COREG) 6.25 MG tablet, Take 1 tablet (6.25 mg total) by mouth 2 (two) times daily with a meal., Disp: 180 tablet, Rfl: 3 .  furosemide (LASIX) 40 MG tablet, Take 1 tablet (40 mg total) by mouth 2 (two) times daily., Disp: 180 tablet, Rfl: 3 .  gabapentin (NEURONTIN) 300 MG capsule, Take 2 capsules (600 mg total) by mouth 2 (two) times daily., Disp: 120 capsule, Rfl: 3 .  Insulin Glargine (LANTUS SOLOSTAR) 100 UNIT/ML Solostar Pen, Inject 35 Units into the skin 2 (two) times daily., Disp: 30 mL, Rfl: 5 .  omeprazole (PRILOSEC) 20 MG capsule, Take 1 capsule (20 mg total) by mouth daily. (Patient taking differently: Take 20 mg by mouth as needed (reflux). ), Disp: 90 capsule, Rfl: 3 .  potassium chloride SA (K-DUR,KLOR-CON) 20  MEQ tablet, Take 1 tablet (20 mEq total) by mouth daily., Disp: 90 tablet, Rfl: 3 .  spironolactone (ALDACTONE) 25 MG tablet, Take 1 tablet (25 mg total) by mouth daily., Disp: 90 tablet, Rfl: 3 .  apixaban (ELIQUIS) 5 MG TABS tablet, Take 1 tablet (5 mg total) by mouth 2 (two) times daily. (Patient not taking: Reported on 02/07/2019), Disp: 60 tablet, Rfl: 0 .  cetirizine (ZYRTEC) 10 MG tablet, TAKE 1 TABLET  (10 MG TOTAL) BY MOUTH DAILY. (Patient not taking: Reported on 02/07/2019), Disp: 30 tablet, Rfl: 2 .  diclofenac sodium (VOLTAREN) 1 % GEL, Apply 4 g topically 4 (four) times daily as needed (to painful sites)., Disp: 100 g, Rfl: 3 .  EASY COMFORT PEN NEEDLES 31G X 8 MM MISC, USE AS DIRECTED TWICE A DAY, Disp: 100 each, Rfl: 5 .  glucose blood test strip, Use as instructed, Disp: 100 each, Rfl: 12 .  hydrocortisone 2.5 % cream, Apply 1 application topically 2 (two) times daily as needed (rash). , Disp: , Rfl: 5 .  nystatin (MYCOSTATIN/NYSTOP) powder, Apply topically 3 (three) times daily. (Patient not taking: Reported on 02/07/2019), Disp: 15 g, Rfl: 0 .  sildenafil (VIAGRA) 50 MG tablet, Take 1 tablet (50 mg total) by mouth daily as needed for erectile dysfunction. At least 24 hours between doses, Disp: 10 tablet, Rfl: 0 .  thiamine 100 MG tablet, Take 1 tablet (100 mg total) by mouth daily. (Patient not taking: Reported on 02/07/2019), Disp: 30 tablet, Rfl: 0 Allergies  Allergen Reactions  . Lisinopril Anaphylaxis and Other (See Comments)    Throat swells  . Pamelor [Nortriptyline Hcl] Anaphylaxis and Swelling    Throat swells  . Angiotensin Receptor Blockers Other (See Comments)    (Angioedema with lisinopril, therefore ARB's are contraindicated)     Social History   Socioeconomic History  . Marital status: Married    Spouse name: Not on file  . Number of children: 3  . Years of education: 2y college  . Highest education level: Not on file  Occupational History  . 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 Financial resource strain: Not on file  . Food insecurity:    Worry: Not on file    Inability: Not on file  . Transportation needs:    Medical: Not on file    Non-medical: Not on file  Tobacco Use  . Smoking status: Current Every Day Smoker    Packs/day: 0.50    Years: 28.00    Pack years: 14.00    Types: Cigarettes  . Smokeless  tobacco: Never Used  Substance and Sexual Activity  . Alcohol use: Yes    Alcohol/week: 23.0 standard drinks    Types: 23 Cans of beer per week    Comment: 08/17/2018 "I drink ~ 40oz beer/day"  . Drug use: Yes    Types: IV, Cocaine, Heroin    Comment: 08/17/2018 "no heroin since 2013; I use cocaine ~ once/month, last use 10/28/2018"  . Sexual activity: Not on file  Lifestyle  . Physical activity:    Days per week: Not on file    Minutes per session: Not on file  . Stress: Not on file  Relationships  . Social connections:    Talks on phone: Not on file    Gets together: Not on file    Attends religious service: Not on file    Active member of club or organization:  Not on file    Attends meetings of clubs or organizations: Not on file    Relationship status: Not on file  . Intimate partner violence:    Fear of current or ex partner: Not on file    Emotionally abused: Not on file    Physically abused: Not on file    Forced sexual activity: Not on file  Other Topics Concern  . Not on file  Social History Narrative   ** Merged History Encounter **       Incarcerated from 2006-2010, then 10/2011-12/2011.  Has been trying to get sober (no heroin, alcohol since 10/2011).     Physical Exam Vitals signs reviewed.  Constitutional:      Appearance: Normal appearance.  HENT:     Head: Normocephalic.  Eyes:     Pupils: Pupils are equal, round, and reactive to light.  Neck:     Musculoskeletal: Normal range of motion and neck supple.  Cardiovascular:     Rate and Rhythm: Normal rate and regular rhythm.  Pulmonary:     Effort: Pulmonary effort is normal. No respiratory distress.     Breath sounds: Normal breath sounds. No wheezing.  Abdominal:     General: There is distension.     Palpations: Abdomen is soft.  Musculoskeletal: Normal range of motion.     Right lower leg: Edema present.     Left lower leg: Edema present.  Skin:    General: Skin is warm.     Capillary Refill:  Capillary refill takes less than 2 seconds.  Neurological:     General: No focal deficit present.     Mental Status: He is alert.  Psychiatric:        Mood and Affect: Mood normal.        Behavior: Behavior normal.    Today seen Brad [name] for initial visit for paramedicine encounter.  Levi is currently living in a two bedroom apartment with a male friend. He resides in a bedroom with a number of boxes and he sleeps on a thin mattress on the floor with a pillow and a blanket. Patient was seated in a chair in the room alert and oriented in no distress. [place]lane agreed to me coming out weekly to check on him and verify medications and fill his pill box as well as checking on vitals and weight. Patient's medications verified and reviewed and placed in pill box appropriately. Patient's vitals obtained and as noted as well as patient's weight. Chart reviewed and appointment confirmed for future appointments. Patient had no difficulty breathing, patient had no complaints of pain or difficulty breathing. Patient had some edema noted in his lower legs. Patient stated he has been having some pain in his ankles upon walking. Patient stated he is only currently eating one meal a day but is taking his medications as prescribed. Patient stated he needs a blood glucose meter to check his sugar daily. I will be following up with [facility] for same. Appointment scheduled for one week out. Visit completed.    Meds Refilled: Lasix, Potassium, Voltaren Gel, Amtryp.  Weight: 180.8lbs    ACTION: Home visit completed Next visit planned for One week.

## 2019-02-07 NOTE — Telephone Encounter (Signed)
Spoke with Gwyndolyn Saxon to schedule appointment for today at 11am. Appointment confirmed.

## 2019-02-14 ENCOUNTER — Telehealth: Payer: Self-pay | Admitting: *Deleted

## 2019-02-14 ENCOUNTER — Telehealth (HOSPITAL_COMMUNITY): Payer: Self-pay

## 2019-02-14 ENCOUNTER — Encounter (HOSPITAL_COMMUNITY): Payer: Self-pay

## 2019-02-14 ENCOUNTER — Other Ambulatory Visit (HOSPITAL_COMMUNITY): Payer: Self-pay

## 2019-02-14 ENCOUNTER — Telehealth: Payer: Self-pay

## 2019-02-14 NOTE — Progress Notes (Signed)
Paramedicine Encounter    Patient ID: Brad [name], male    DOB: 01/27/1961, 57 y.o.   MRN: [id number]   Patient Care Team: Newlin, Enobong, MD as PCP - General (Family Medicine) Hilty, Kenneth C, MD as PCP - Cardiology (Cardiology) Newlin, Enobong, MD (Family Medicine)  Patient Active Problem List   Diagnosis Date Noted  . Frequent falls 01/17/2019  . Severe mitral regurgitation   . Fall 11/01/2018  . Hyponatremia 10/31/2018  . Abdominal distention   . Acute systolic congestive heart failure (HCC)   . Chronic atrial fibrillation   . Chronic hyponatremia 08/16/2018  . NSVT (nonsustained ventricular tachycardia) (HCC)   . Concussion with loss of consciousness   . Scalp laceration   . Trauma   . Permanent atrial fibrillation   . Hypertensive heart disease   . Shortness of breath   . ACS (acute coronary syndrome) (HCC) 07/25/2018  . Pyogenic inflammation of bone (HCC)   . Ankle swelling, right 04/30/2018  . Cirrhosis (HCC) 03/23/2018  . Right ankle pain 03/23/2018  . Acute respiratory failure with hypoxia (HCC) 11/06/2017  . Syncope 08/07/2017  . Acute on chronic combined systolic and diastolic heart failure (HCC) 07/09/2017  . Atrial flutter (HCC) 07/09/2017  . Pre-syncope 07/08/2017  . Neuropathy 05/08/2017  . Substance induced mood disorder (HCC) 10/06/2016  . CVA (cerebral vascular accident) (HCC) 09/18/2016  . Left sided numbness   . Homelessness 08/21/2016  . S/P ORIF (open reduction internal fixation) fracture 08/01/2016  . CAD (coronary artery disease), native coronary artery 07/30/2016  . Surgery, elective   . Insomnia 07/22/2016  . Acute diastolic heart failure (HCC)   . NSTEMI (non-ST elevated myocardial infarction) (HCC)   . Normocytic anemia 07/05/2016  . Thrombocytopenia (HCC) 07/05/2016  . AKI (acute kidney injury) (HCC)   . Cocaine abuse (HCC) 07/02/2016  . Essential hypertension 07/01/2016  . Type 2 diabetes mellitus with complication, without  long-term current use of insulin (HCC) 07/01/2016  . Hypokalemia 07/01/2016  . CKD (chronic kidney disease) stage 3, GFR 30-59 ml/min (HCC) 07/01/2016  . Painful diabetic neuropathy (HCC) 07/01/2016  . Polysubstance abuse (HCC) 05/27/2016  . Chronic hepatitis C with cirrhosis (HCC) 05/27/2016  . Chronic diastolic congestive heart failure (HCC) 01/31/2016  . Depression 04/21/2012  . GERD (gastroesophageal reflux disease) 02/16/2012  . History of drug abuse (HCC)   . Heroin addiction (HCC) 01/29/2012    Current Outpatient Medications:  .  ACCU-CHEK FASTCLIX LANCETS MISC, Use as directed three times daily, Disp: 102 each, Rfl: 12 .  amitriptyline (ELAVIL) 75 MG tablet, Take 1 tablet (75 mg total) by mouth at bedtime., Disp: 30 tablet, Rfl: 6 .  apixaban (ELIQUIS) 5 MG TABS tablet, Take 1 tablet (5 mg total) by mouth 2 (two) times daily. (Patient not taking: Reported on 02/07/2019), Disp: 60 tablet, Rfl: 0 .  atorvastatin (LIPITOR) 40 MG tablet, Take 1 tablet (40 mg total) by mouth daily., Disp: 90 tablet, Rfl: 3 .  Blood Glucose Monitoring Suppl (ACCU-CHEK GUIDE) w/Device KIT, 1 each by Does not apply route 3 (three) times daily., Disp: 1 kit, Rfl: 0 .  carvedilol (COREG) 6.25 MG tablet, Take 1 tablet (6.25 mg total) by mouth 2 (two) times daily with a meal., Disp: 180 tablet, Rfl: 3 .  cetirizine (ZYRTEC) 10 MG tablet, TAKE 1 TABLET (10 MG TOTAL) BY MOUTH DAILY., Disp: 30 tablet, Rfl: 2 .  diclofenac sodium (VOLTAREN) 1 % GEL, Apply 4 g topically 4 (four) times daily as needed (  to painful sites)., Disp: 100 g, Rfl: 3 .  EASY COMFORT PEN NEEDLES 31G X 8 MM MISC, USE AS DIRECTED TWICE A DAY, Disp: 100 each, Rfl: 5 .  furosemide (LASIX) 40 MG tablet, Take 1 tablet (40 mg total) by mouth 2 (two) times daily., Disp: 180 tablet, Rfl: 3 .  gabapentin (NEURONTIN) 300 MG capsule, Take 2 capsules (600 mg total) by mouth 2 (two) times daily., Disp: 120 capsule, Rfl: 3 .  glucose blood (ACCU-CHEK GUIDE)  test strip, Use as directed three times daily, Disp: 100 each, Rfl: 12 .  hydrocortisone 2.5 % cream, Apply 1 application topically 2 (two) times daily as needed (rash). , Disp: , Rfl: 5 .  Insulin Glargine (LANTUS SOLOSTAR) 100 UNIT/ML Solostar Pen, Inject 35 Units into the skin 2 (two) times daily., Disp: 30 mL, Rfl: 5 .  nystatin (MYCOSTATIN/NYSTOP) powder, Apply topically 3 (three) times daily. (Patient not taking: Reported on 02/07/2019), Disp: 15 g, Rfl: 0 .  omeprazole (PRILOSEC) 20 MG capsule, Take 1 capsule (20 mg total) by mouth daily. (Patient taking differently: Take 20 mg by mouth as needed (reflux). ), Disp: 90 capsule, Rfl: 3 .  potassium chloride SA (K-DUR,KLOR-CON) 20 MEQ tablet, Take 1 tablet (20 mEq total) by mouth daily., Disp: 90 tablet, Rfl: 3 .  sildenafil (VIAGRA) 50 MG tablet, Take 1 tablet (50 mg total) by mouth daily as needed for erectile dysfunction. At least 24 hours between doses, Disp: 10 tablet, Rfl: 0 .  spironolactone (ALDACTONE) 25 MG tablet, Take 1 tablet (25 mg total) by mouth daily., Disp: 90 tablet, Rfl: 3 .  thiamine 100 MG tablet, Take 1 tablet (100 mg total) by mouth daily. (Patient not taking: Reported on 02/07/2019), Disp: 30 tablet, Rfl: 0 Allergies  Allergen Reactions  . Lisinopril Anaphylaxis and Other (See Comments)    Throat swells  . Pamelor [Nortriptyline Hcl] Anaphylaxis and Swelling    Throat swells  . Angiotensin Receptor Blockers Other (See Comments)    (Angioedema with lisinopril, therefore ARB's are contraindicated)     Social History   Socioeconomic History  . Marital status: Married    Spouse name: Not on file  . Number of children: 3  . Years of education: 2y college  . Highest education level: Not on file  Occupational History  . 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 Financial resource strain: Not on file  . Food insecurity:    Worry: Not on file    Inability: Not on file  .  Transportation needs:    Medical: Not on file    Non-medical: Not on file  Tobacco Use  . Smoking status: Current Every Day Smoker    Packs/day: 0.50    Years: 28.00    Pack years: 14.00    Types: Cigarettes  . Smokeless tobacco: Never Used  Substance and Sexual Activity  . Alcohol use: Yes    Alcohol/week: 23.0 standard drinks    Types: 23 Cans of beer per week    Comment: 08/17/2018 "I drink ~ 40oz beer/day"  . Drug use: Yes    Types: IV, Cocaine, Heroin    Comment: 08/17/2018 "no heroin since 2013; I use cocaine ~ once/month, last use 10/28/2018"  . Sexual activity: Not on file  Lifestyle  . Physical activity:    Days per week: Not on file    Minutes per session: Not on file  . Stress: Not on   file  Relationships  . Social connections:    Talks on phone: Not on file    Gets together: Not on file    Attends religious service: Not on file    Active member of club or organization: Not on file    Attends meetings of clubs or organizations: Not on file    Relationship status: Not on file  . Intimate partner violence:    Fear of current or ex partner: Not on file    Emotionally abused: Not on file    Physically abused: Not on file    Forced sexual activity: Not on file  Other Topics Concern  . Not on file  Social History Narrative   ** Merged History Encounter **       Incarcerated from 2006-2010, then 10/2011-12/2011.  Has been trying to get sober (no heroin, alcohol since 10/2011).     Physical Exam Vitals signs reviewed.  Constitutional:      General: He is not in acute distress.    Appearance: Normal appearance. He is normal weight. He is not ill-appearing or diaphoretic.  Eyes:     Pupils: Pupils are equal, round, and reactive to light.  Neck:     Musculoskeletal: Normal range of motion.  Cardiovascular:     Rate and Rhythm: Normal rate and regular rhythm.     Pulses: Normal pulses.  Pulmonary:     Effort: Pulmonary effort is normal. No respiratory distress.      Breath sounds: No stridor. No wheezing, rhonchi or rales.  Abdominal:     General: Abdomen is flat.     Palpations: Abdomen is soft.  Musculoskeletal: Normal range of motion.        General: No swelling.     Right lower leg: No edema.     Left lower leg: No edema.  Skin:    General: Skin is warm and dry.     Capillary Refill: Capillary refill takes less than 2 seconds.     Coloration: Skin is not jaundiced or pale.  Neurological:     Mental Status: He is alert.  Psychiatric:        Mood and Affect: Mood normal.     Today visited with Brad [name] at home, patient noted to be awake and alert and engaged in conversation. Brad [name] had no pain and no complaints of any pain today. Brad [name] noted to have had taken some of his medications from his pill box but stated he got confused so he took them out of the pill bottles instead of the box. Pills were reviewed and corrected in the pill box. Medications noted to be in pill bottles but not in epic were Norvasc and Losartan. Same were brought to the attention of Brad Goodpasture RN message sent to Dr. Margarita Rana. Patient stated his amitriptyline has been making him dizzy and causing him to fall lately. Unsure if this is accurate or if this is related to his use of alcohol. Patient stated he was feeling okay today. Vitals obtained and reviewed. Patient stated he has been eating 2 meals a day recently rather than his normal 1 meal a day. Patient stated he wants to get out of his current living situation and move out on his own, patient was referred to speak with Brad [name] at [facility] for same and was noted that the patient had an appointment for an apartment housing and missed same. Patient was encouraged to call the complex back and reschedule appointment for placement. Patient was agreeable to  an appointment next week around the same time. Home visit complete.    **noted ten pound weight loss**  Wt last week: 180lbs Wt this week: 170lbs   BP: 104/60 HR: 86 O2:  95% CBG:144       ACTION: Home visit completed Next visit planned for one week

## 2019-02-14 NOTE — Telephone Encounter (Signed)
Gwyndolyn Saxon returned call and confirmed appointment for 2:30pm this afternoon. Call completed.

## 2019-02-14 NOTE — Telephone Encounter (Addendum)
[name] with [facility] wanted to clarify if [name] is taking the Losartan and Amlodipine. She went to his home and saw these medication bottles but did not see these medications on the medication list. His blood pressure was 104/64 and have been good at last two encounters.   Please advise.

## 2019-02-14 NOTE — Telephone Encounter (Signed)
Attempted to contact patient at request of [name], EMT . Call placed to # [phone] and message left with call back # [phone]

## 2019-02-14 NOTE — Telephone Encounter (Signed)
Left message on voicemail for scheduling an appointment.

## 2019-02-14 NOTE — Telephone Encounter (Signed)
I do not have him on losartan and amlodipine.  He needs to stay on the medications listed on his med list.

## 2019-02-15 ENCOUNTER — Telehealth: Payer: Self-pay

## 2019-02-15 NOTE — Telephone Encounter (Signed)
Informed [name] with [facility] about the medications in question : Amlodipine and Losartan.

## 2019-02-15 NOTE — Telephone Encounter (Signed)
Call placed to [facility] regarding the status of the order for compression stockings and if the patient has been contacted. Marland Kitchen Spoke to [place] who stated that the order has been received and sent to retail store but there is no additional information.  She said that the phones for the retail store were not working. She sent a message to have retail store contact this CM # [phone].   Call placed to patient. He said that he has not heard from [facility] about the compression stockings yet. Informed him that this CM is waiting for a call back from [facility].  He then explained that he went to court for a hearing today and the case was thrown out but he needs to go back 02/23/2019 for a different hearing. He said that he wants to get out of his current living situation but understands that he has shelter. He also noted that he will call [facility] again as he missed the last orientation to get on the wait list.   He also said that he went to DSS to apply for food stamps. Still waiting on a determination of eligibility.

## 2019-02-21 ENCOUNTER — Other Ambulatory Visit: Payer: Self-pay

## 2019-02-21 ENCOUNTER — Inpatient Hospital Stay (HOSPITAL_COMMUNITY)
Admission: EM | Admit: 2019-02-21 | Discharge: 2019-02-24 | DRG: 291 | Disposition: A | Discharge status: Home / Self Care | Payer: Medicaid Other | Attending: Internal Medicine | Admitting: Internal Medicine

## 2019-02-21 ENCOUNTER — Telehealth (HOSPITAL_COMMUNITY): Payer: Self-pay

## 2019-02-21 ENCOUNTER — Encounter (HOSPITAL_COMMUNITY): Payer: Self-pay

## 2019-02-21 ENCOUNTER — Emergency Department (HOSPITAL_COMMUNITY): Payer: Medicaid Other

## 2019-02-21 DIAGNOSIS — I4729 Other ventricular tachycardia: Secondary | ICD-10-CM

## 2019-02-21 DIAGNOSIS — I130 Hypertensive heart and chronic kidney disease with heart failure and stage 1 through stage 4 chronic kidney disease, or unspecified chronic kidney disease: Principal | ICD-10-CM | POA: Diagnosis present

## 2019-02-21 DIAGNOSIS — Z8249 Family history of ischemic heart disease and other diseases of the circulatory system: Secondary | ICD-10-CM

## 2019-02-21 DIAGNOSIS — D631 Anemia in chronic kidney disease: Secondary | ICD-10-CM | POA: Diagnosis present

## 2019-02-21 DIAGNOSIS — F1410 Cocaine abuse, uncomplicated: Secondary | ICD-10-CM | POA: Diagnosis present

## 2019-02-21 DIAGNOSIS — Z79899 Other long term (current) drug therapy: Secondary | ICD-10-CM

## 2019-02-21 DIAGNOSIS — E1122 Type 2 diabetes mellitus with diabetic chronic kidney disease: Secondary | ICD-10-CM | POA: Diagnosis present

## 2019-02-21 DIAGNOSIS — F17210 Nicotine dependence, cigarettes, uncomplicated: Secondary | ICD-10-CM | POA: Diagnosis present

## 2019-02-21 DIAGNOSIS — I5022 Chronic systolic (congestive) heart failure: Secondary | ICD-10-CM

## 2019-02-21 DIAGNOSIS — I10 Essential (primary) hypertension: Secondary | ICD-10-CM | POA: Diagnosis present

## 2019-02-21 DIAGNOSIS — D649 Anemia, unspecified: Secondary | ICD-10-CM | POA: Diagnosis present

## 2019-02-21 DIAGNOSIS — F1490 Cocaine use, unspecified, uncomplicated: Secondary | ICD-10-CM

## 2019-02-21 DIAGNOSIS — F1020 Alcohol dependence, uncomplicated: Secondary | ICD-10-CM | POA: Diagnosis present

## 2019-02-21 DIAGNOSIS — I5023 Acute on chronic systolic (congestive) heart failure: Secondary | ICD-10-CM | POA: Diagnosis not present

## 2019-02-21 DIAGNOSIS — E785 Hyperlipidemia, unspecified: Secondary | ICD-10-CM | POA: Diagnosis present

## 2019-02-21 DIAGNOSIS — K219 Gastro-esophageal reflux disease without esophagitis: Secondary | ICD-10-CM | POA: Diagnosis present

## 2019-02-21 DIAGNOSIS — E871 Hypo-osmolality and hyponatremia: Secondary | ICD-10-CM | POA: Diagnosis present

## 2019-02-21 DIAGNOSIS — I472 Ventricular tachycardia: Secondary | ICD-10-CM

## 2019-02-21 DIAGNOSIS — N183 Chronic kidney disease, stage 3 (moderate): Secondary | ICD-10-CM | POA: Diagnosis present

## 2019-02-21 DIAGNOSIS — I5043 Acute on chronic combined systolic (congestive) and diastolic (congestive) heart failure: Secondary | ICD-10-CM | POA: Diagnosis present

## 2019-02-21 DIAGNOSIS — Z888 Allergy status to other drugs, medicaments and biological substances status: Secondary | ICD-10-CM

## 2019-02-21 DIAGNOSIS — I509 Heart failure, unspecified: Secondary | ICD-10-CM

## 2019-02-21 DIAGNOSIS — Z7901 Long term (current) use of anticoagulants: Secondary | ICD-10-CM

## 2019-02-21 DIAGNOSIS — E1142 Type 2 diabetes mellitus with diabetic polyneuropathy: Secondary | ICD-10-CM | POA: Diagnosis present

## 2019-02-21 DIAGNOSIS — F1010 Alcohol abuse, uncomplicated: Secondary | ICD-10-CM | POA: Diagnosis present

## 2019-02-21 DIAGNOSIS — M1990 Unspecified osteoarthritis, unspecified site: Secondary | ICD-10-CM | POA: Diagnosis present

## 2019-02-21 DIAGNOSIS — Z8546 Personal history of malignant neoplasm of prostate: Secondary | ICD-10-CM

## 2019-02-21 DIAGNOSIS — I493 Ventricular premature depolarization: Secondary | ICD-10-CM | POA: Diagnosis present

## 2019-02-21 DIAGNOSIS — Z794 Long term (current) use of insulin: Secondary | ICD-10-CM

## 2019-02-21 DIAGNOSIS — I083 Combined rheumatic disorders of mitral, aortic and tricuspid valves: Secondary | ICD-10-CM | POA: Diagnosis present

## 2019-02-21 DIAGNOSIS — I2510 Atherosclerotic heart disease of native coronary artery without angina pectoris: Secondary | ICD-10-CM | POA: Diagnosis present

## 2019-02-21 LAB — I-STAT TROPONIN, ED: Troponin i, poc: 0.03 ng/mL (ref 0.00–0.08)

## 2019-02-21 LAB — BASIC METABOLIC PANEL
Anion gap: 10 (ref 5–15)
BUN: 12 mg/dL (ref 6–20)
CO2: 20 mmol/L — ABNORMAL LOW (ref 22–32)
Calcium: 9 mg/dL (ref 8.9–10.3)
Chloride: 91 mmol/L — ABNORMAL LOW (ref 98–111)
Creatinine, Ser: 1.41 mg/dL — ABNORMAL HIGH (ref 0.61–1.24)
GFR calc Af Amer: >60 mL/min (ref >60)
GFR calc non Af Amer: 54 mL/min — ABNORMAL LOW (ref >60)
Glucose, Bld: 138 mg/dL — ABNORMAL HIGH (ref 70–99)
Potassium: 4.6 mmol/L (ref 3.5–5.1)
Sodium: 121 mmol/L — ABNORMAL LOW (ref 135–145)

## 2019-02-21 LAB — URINALYSIS, ROUTINE W REFLEX MICROSCOPIC
Bilirubin Urine: NEGATIVE
Glucose, UA: NEGATIVE mg/dL
Ketones, ur: NEGATIVE mg/dL
Leukocytes,Ua: NEGATIVE
Nitrite: NEGATIVE
Protein, ur: 100 mg/dL — AB
Specific Gravity, Urine: 1.013 (ref 1.005–1.030)
pH: 5 (ref 5.0–8.0)

## 2019-02-21 LAB — CBC WITH DIFFERENTIAL/PLATELET
Abs Immature Granulocytes: 0.01 10*3/uL (ref 0.00–0.07)
Basophils Absolute: 0 10*3/uL (ref 0.0–0.1)
Basophils Relative: 1 %
Eosinophils Absolute: 0 10*3/uL (ref 0.0–0.5)
Eosinophils Relative: 1 %
HCT: 36.5 % — ABNORMAL LOW (ref 39.0–52.0)
Hemoglobin: 12.5 g/dL — ABNORMAL LOW (ref 13.0–17.0)
Immature Granulocytes: 0 %
Lymphocytes Relative: 23 %
Lymphs Abs: 1.2 10*3/uL (ref 0.7–4.0)
MCH: 30 pg (ref 26.0–34.0)
MCHC: 34.2 g/dL (ref 30.0–36.0)
MCV: 87.7 fL (ref 80.0–100.0)
Monocytes Absolute: 0.7 10*3/uL (ref 0.1–1.0)
Monocytes Relative: 14 %
Neutro Abs: 3.2 10*3/uL (ref 1.7–7.7)
Neutrophils Relative %: 61 %
Platelets: 168 10*3/uL (ref 150–400)
RBC: 4.16 MIL/uL — ABNORMAL LOW (ref 4.22–5.81)
RDW: 15.4 % (ref 11.5–15.5)
WBC: 5.1 10*3/uL (ref 4.0–10.5)
nRBC: 0 % (ref 0.0–0.2)

## 2019-02-21 LAB — RAPID URINE DRUG SCREEN, HOSP PERFORMED
Amphetamines: NOT DETECTED
Barbiturates: NOT DETECTED
Benzodiazepines: NOT DETECTED
Cocaine: POSITIVE — AB
Opiates: NOT DETECTED
Tetrahydrocannabinol: NOT DETECTED

## 2019-02-21 LAB — BRAIN NATRIURETIC PEPTIDE: B Natriuretic Peptide: 1654.8 pg/mL — ABNORMAL HIGH (ref 0.0–100.0)

## 2019-02-21 MED ORDER — MAGNESIUM SULFATE 2 GM/50ML IV SOLN
2.0000 g | Freq: Once | INTRAVENOUS | Status: AC
  Administered 2019-02-21: 2 g via INTRAVENOUS
  Filled 2019-02-21: qty 50

## 2019-02-21 MED ORDER — ASPIRIN 81 MG PO CHEW: 324.0000 mg | CHEWABLE_TABLET | Freq: Once | ORAL | Status: DC

## 2019-02-21 MED ORDER — NICOTINE 21 MG/24HR TD PT24
21.0000 mg | MEDICATED_PATCH | Freq: Every day | TRANSDERMAL | Status: DC | PRN
  Administered 2019-02-22: 21 mg via TRANSDERMAL
  Filled 2019-02-21: qty 1

## 2019-02-21 MED ORDER — FAMOTIDINE IN NACL 20-0.9 MG/50ML-% IV SOLN
20.0000 mg | Freq: Once | INTRAVENOUS | Status: AC
  Administered 2019-02-21: 20 mg via INTRAVENOUS
  Filled 2019-02-21: qty 50

## 2019-02-21 MED ORDER — FUROSEMIDE 10 MG/ML IJ SOLN
40.0000 mg | Freq: Two times a day (BID) | INTRAMUSCULAR | Status: DC
  Administered 2019-02-22 – 2019-02-24 (×5): 40 mg via INTRAVENOUS
  Filled 2019-02-21 (×5): qty 4

## 2019-02-21 MED ORDER — FENTANYL CITRATE (PF) 100 MCG/2ML IJ SOLN
25.0000 ug | Freq: Once | INTRAMUSCULAR | Status: AC
  Administered 2019-02-21: 25 ug via INTRAVENOUS
  Filled 2019-02-21: qty 2

## 2019-02-21 MED ORDER — ONDANSETRON HCL 4 MG/2ML IJ SOLN
4.0000 mg | Freq: Once | INTRAMUSCULAR | Status: AC
  Administered 2019-02-21: 4 mg via INTRAVENOUS
  Filled 2019-02-21: qty 2

## 2019-02-21 MED ORDER — ACETAMINOPHEN 325 MG PO TABS
650.0000 mg | ORAL_TABLET | ORAL | Status: DC | PRN
  Administered 2019-02-23: 650 mg via ORAL
  Filled 2019-02-21: qty 2

## 2019-02-21 MED ORDER — NITROGLYCERIN 2 % TD OINT
1.0000 [in_us] | TOPICAL_OINTMENT | Freq: Four times a day (QID) | TRANSDERMAL | Status: DC
  Administered 2019-02-21: 1 [in_us] via TOPICAL
  Filled 2019-02-21 (×2): qty 1

## 2019-02-21 MED ORDER — APIXABAN 5 MG PO TABS
5.0000 mg | ORAL_TABLET | Freq: Two times a day (BID) | ORAL | Status: DC
  Administered 2019-02-22 (×2): 5 mg via ORAL
  Filled 2019-02-21 (×3): qty 1

## 2019-02-21 MED ORDER — LIDOCAINE VISCOUS HCL 2 % MT SOLN
15.0000 mL | Freq: Once | OROMUCOSAL | Status: AC
  Administered 2019-02-21: 15 mL via ORAL
  Filled 2019-02-21: qty 15

## 2019-02-21 MED ORDER — ONDANSETRON HCL 4 MG/2ML IJ SOLN
4.0000 mg | Freq: Four times a day (QID) | INTRAMUSCULAR | Status: DC | PRN
  Administered 2019-02-22: 4 mg via INTRAVENOUS
  Filled 2019-02-21: qty 2

## 2019-02-21 MED ORDER — FUROSEMIDE 10 MG/ML IJ SOLN
40.0000 mg | Freq: Once | INTRAMUSCULAR | Status: AC
  Administered 2019-02-21: 40 mg via INTRAVENOUS
  Filled 2019-02-21: qty 4

## 2019-02-21 MED ORDER — MORPHINE SULFATE (PF) 4 MG/ML IV SOLN
4.0000 mg | Freq: Once | INTRAVENOUS | Status: AC
  Administered 2019-02-21: 4 mg via INTRAVENOUS
  Filled 2019-02-21: qty 1

## 2019-02-21 NOTE — Telephone Encounter (Signed)
Voicemail left with Brad [name] about scheduling appointment for today.

## 2019-02-21 NOTE — ED Triage Notes (Signed)
Pt BIB [facility] for eval of SOB and worsening LE edema x 3 days. Pt reports he has been out of his prescriptions x 3 days. Reports worsening SOB on laying and exertion. Pt w/ ankle edema, known old injury to R w/ some redness and worsening swelling. GCS 15, denies CP.

## 2019-02-21 NOTE — ED Notes (Signed)
Pt endorses [facility] CP that began this am; pt endorses a relapse on cocaine, last used yesterday; describes CP as pressure, 8/10, non radiating; EDP aware
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
## 2019-02-21 NOTE — ED Notes (Signed)
Per Dr. Lita Mains, Pt OK to eat. Given [place] sandwich bag and iced water.
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
## 2019-02-21 NOTE — ED Notes (Signed)
Pt transported to XR.  

## 2019-02-22 ENCOUNTER — Inpatient Hospital Stay (HOSPITAL_COMMUNITY): Payer: Medicaid Other

## 2019-02-22 DIAGNOSIS — Z79899 Other long term (current) drug therapy: Secondary | ICD-10-CM | POA: Diagnosis not present

## 2019-02-22 DIAGNOSIS — I130 Hypertensive heart and chronic kidney disease with heart failure and stage 1 through stage 4 chronic kidney disease, or unspecified chronic kidney disease: Secondary | ICD-10-CM | POA: Diagnosis present

## 2019-02-22 DIAGNOSIS — I361 Nonrheumatic tricuspid (valve) insufficiency: Secondary | ICD-10-CM

## 2019-02-22 DIAGNOSIS — K219 Gastro-esophageal reflux disease without esophagitis: Secondary | ICD-10-CM | POA: Diagnosis present

## 2019-02-22 DIAGNOSIS — I2510 Atherosclerotic heart disease of native coronary artery without angina pectoris: Secondary | ICD-10-CM | POA: Diagnosis present

## 2019-02-22 DIAGNOSIS — E1142 Type 2 diabetes mellitus with diabetic polyneuropathy: Secondary | ICD-10-CM | POA: Diagnosis present

## 2019-02-22 DIAGNOSIS — I083 Combined rheumatic disorders of mitral, aortic and tricuspid valves: Secondary | ICD-10-CM | POA: Diagnosis present

## 2019-02-22 DIAGNOSIS — R0602 Shortness of breath: Secondary | ICD-10-CM | POA: Diagnosis present

## 2019-02-22 DIAGNOSIS — I340 Nonrheumatic mitral (valve) insufficiency: Secondary | ICD-10-CM | POA: Diagnosis not present

## 2019-02-22 DIAGNOSIS — M1990 Unspecified osteoarthritis, unspecified site: Secondary | ICD-10-CM | POA: Diagnosis present

## 2019-02-22 DIAGNOSIS — I5023 Acute on chronic systolic (congestive) heart failure: Secondary | ICD-10-CM | POA: Diagnosis not present

## 2019-02-22 DIAGNOSIS — D631 Anemia in chronic kidney disease: Secondary | ICD-10-CM | POA: Diagnosis present

## 2019-02-22 DIAGNOSIS — Z8546 Personal history of malignant neoplasm of prostate: Secondary | ICD-10-CM | POA: Diagnosis not present

## 2019-02-22 DIAGNOSIS — I493 Ventricular premature depolarization: Secondary | ICD-10-CM | POA: Diagnosis present

## 2019-02-22 DIAGNOSIS — I472 Ventricular tachycardia: Secondary | ICD-10-CM | POA: Diagnosis present

## 2019-02-22 DIAGNOSIS — Z7901 Long term (current) use of anticoagulants: Secondary | ICD-10-CM | POA: Diagnosis not present

## 2019-02-22 DIAGNOSIS — F17210 Nicotine dependence, cigarettes, uncomplicated: Secondary | ICD-10-CM | POA: Diagnosis present

## 2019-02-22 DIAGNOSIS — E785 Hyperlipidemia, unspecified: Secondary | ICD-10-CM | POA: Diagnosis present

## 2019-02-22 DIAGNOSIS — Z888 Allergy status to other drugs, medicaments and biological substances status: Secondary | ICD-10-CM | POA: Diagnosis not present

## 2019-02-22 DIAGNOSIS — E1122 Type 2 diabetes mellitus with diabetic chronic kidney disease: Secondary | ICD-10-CM | POA: Diagnosis present

## 2019-02-22 DIAGNOSIS — E871 Hypo-osmolality and hyponatremia: Secondary | ICD-10-CM | POA: Diagnosis present

## 2019-02-22 DIAGNOSIS — N183 Chronic kidney disease, stage 3 (moderate): Secondary | ICD-10-CM | POA: Diagnosis present

## 2019-02-22 DIAGNOSIS — I5043 Acute on chronic combined systolic (congestive) and diastolic (congestive) heart failure: Secondary | ICD-10-CM | POA: Diagnosis present

## 2019-02-22 DIAGNOSIS — Z8249 Family history of ischemic heart disease and other diseases of the circulatory system: Secondary | ICD-10-CM | POA: Diagnosis not present

## 2019-02-22 DIAGNOSIS — F1010 Alcohol abuse, uncomplicated: Secondary | ICD-10-CM | POA: Diagnosis present

## 2019-02-22 DIAGNOSIS — Z794 Long term (current) use of insulin: Secondary | ICD-10-CM | POA: Diagnosis not present

## 2019-02-22 DIAGNOSIS — F1410 Cocaine abuse, uncomplicated: Secondary | ICD-10-CM | POA: Diagnosis present

## 2019-02-22 LAB — ECHOCARDIOGRAM COMPLETE
Height: 71 in
Weight: 2950.4 oz

## 2019-02-22 LAB — BASIC METABOLIC PANEL
Anion gap: 9 (ref 5–15)
BUN: 15 mg/dL (ref 6–20)
CO2: 22 mmol/L (ref 22–32)
Calcium: 9 mg/dL (ref 8.9–10.3)
Chloride: 91 mmol/L — ABNORMAL LOW (ref 98–111)
Creatinine, Ser: 1.53 mg/dL — ABNORMAL HIGH (ref 0.61–1.24)
GFR calc Af Amer: 57 mL/min — ABNORMAL LOW (ref >60)
GFR calc non Af Amer: 49 mL/min — ABNORMAL LOW (ref >60)
Glucose, Bld: 150 mg/dL — ABNORMAL HIGH (ref 70–99)
Potassium: 4.5 mmol/L (ref 3.5–5.1)
Sodium: 122 mmol/L — ABNORMAL LOW (ref 135–145)

## 2019-02-22 LAB — TROPONIN I
Troponin I: 0.04 ng/mL (ref <0.03)
Troponin I: 0.03 ng/mL (ref <0.03)

## 2019-02-22 LAB — GLUCOSE, CAPILLARY
Glucose-Capillary: 184 mg/dL — ABNORMAL HIGH (ref 70–99)
Glucose-Capillary: 111 mg/dL — ABNORMAL HIGH (ref 70–99)

## 2019-02-22 IMAGING — CT CT ABD-PELV W/ CM
2 of 5 series · 15 of 46 positions shown, 17 images · IV contrast (Omni 300)
Comparison: Chest x-ray 02/19/2018, CT 06/26/2016

CLINICAL DATA: Fall with right lower quadrant pain

EXAM:
CT ABDOMEN AND PELVIS WITH CONTRAST
TECHNIQUE: Multidetector CT imaging of the abdomen and pelvis was performed
using the standard protocol following bolus administration of
intravenous contrast.
CONTRAST:  75mL W1PQAV-M88 IOPAMIDOL (W1PQAV-M88) INJECTION 61%

[Series 3: a/p w/ 5mm · axial · 0.68mm/px · z∈[+726,+1186]mm · 12 of 104 slices shown, 14 images]
[im 6/104  soft-tissue]
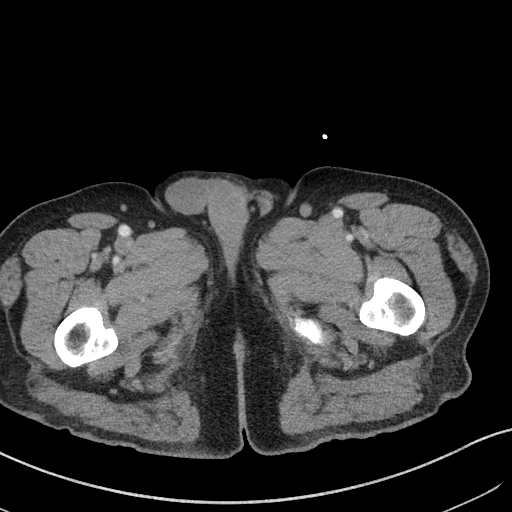
[im 6/104  bone]
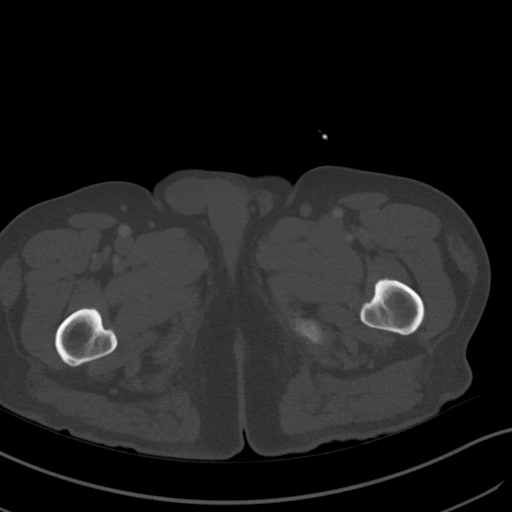
[im 17/104  soft-tissue]
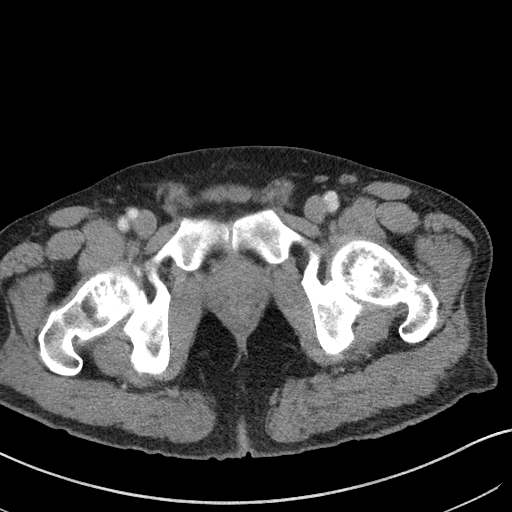
[im 22/104  soft-tissue]
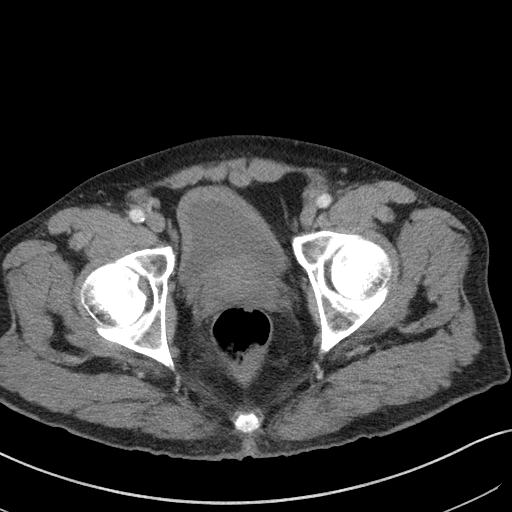
[im 33/104  soft-tissue]
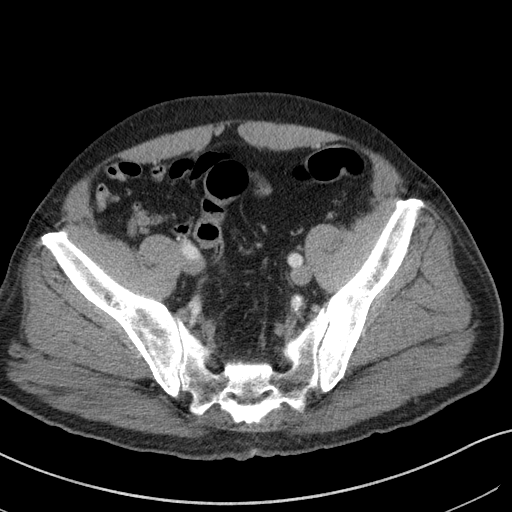
[im 38/104  soft-tissue]
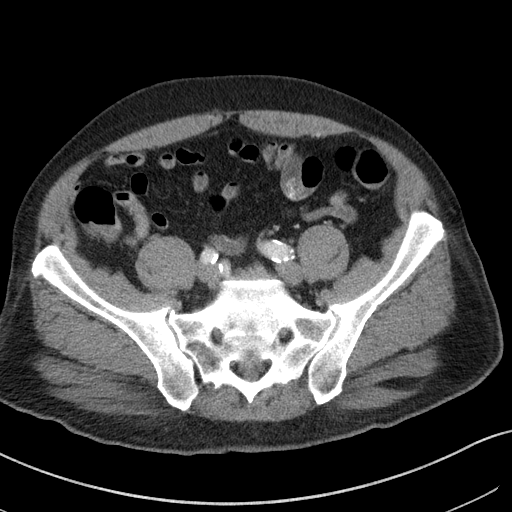
[im 49/104  soft-tissue]
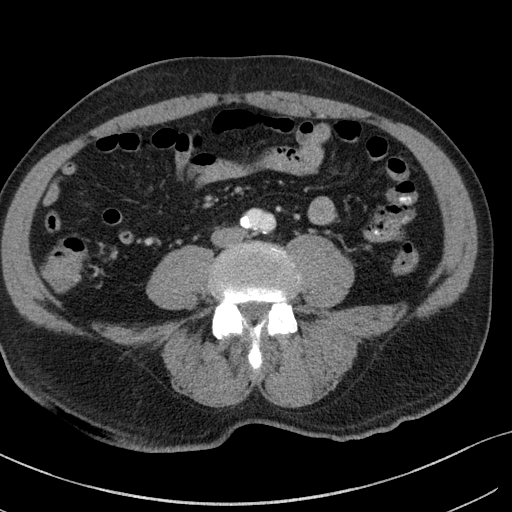
[im 55/104  soft-tissue]
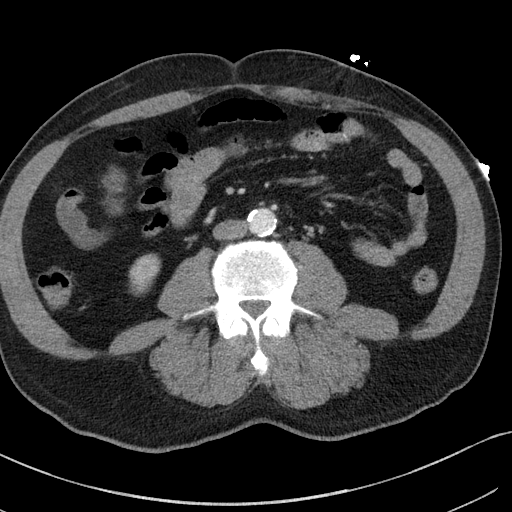
[im 66/104  soft-tissue]
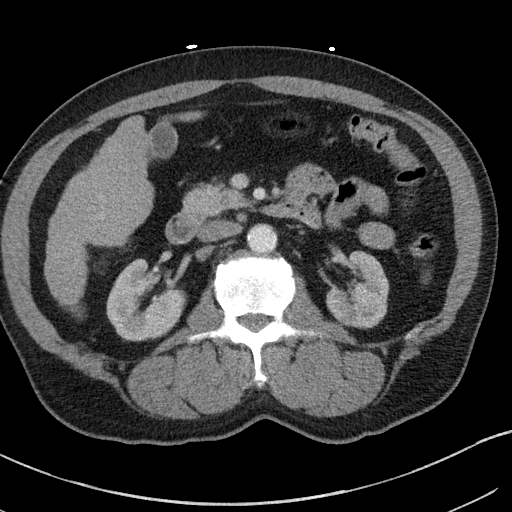
[im 71/104  soft-tissue]
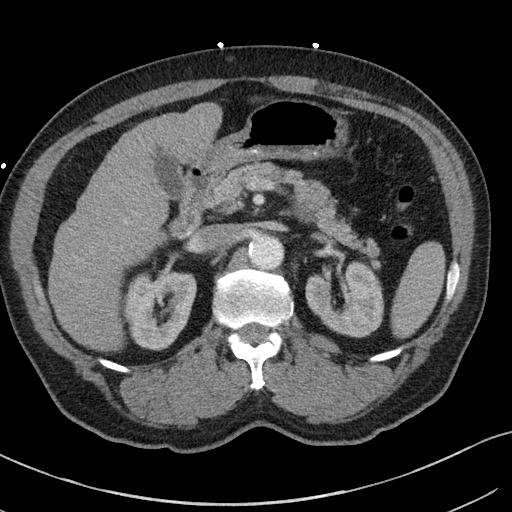
[im 71/104  bone]
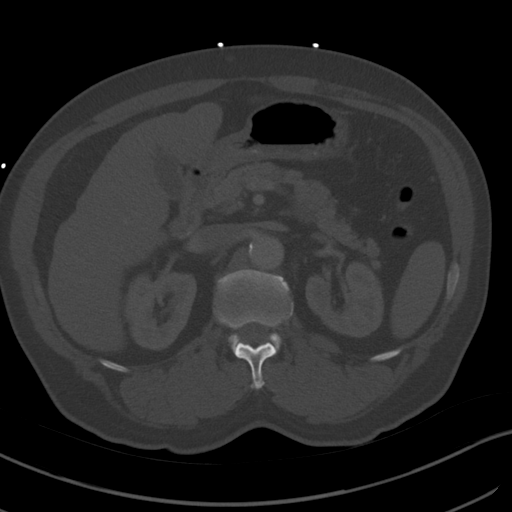
[im 82/104  soft-tissue]
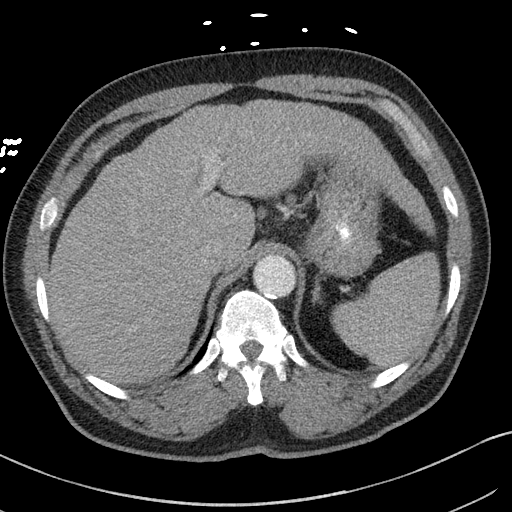
[im 87/104  soft-tissue]
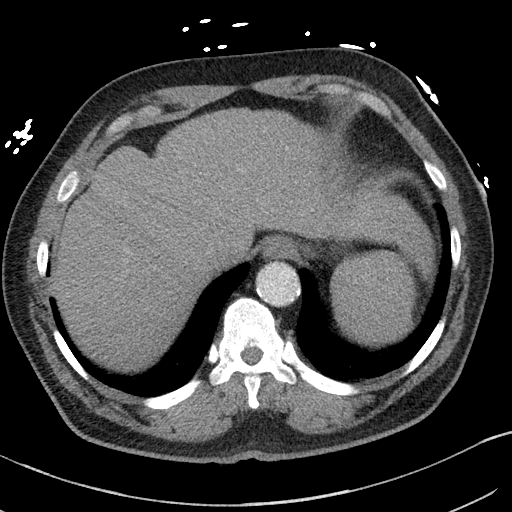
[im 98/104  soft-tissue]
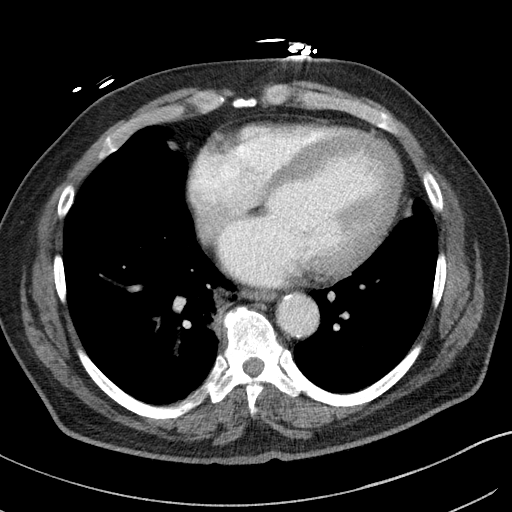

[Series 6: a/p w/ cor · coronal · 0.67mm/px · 3 of 125 slices shown]
[im 42/125  soft-tissue]
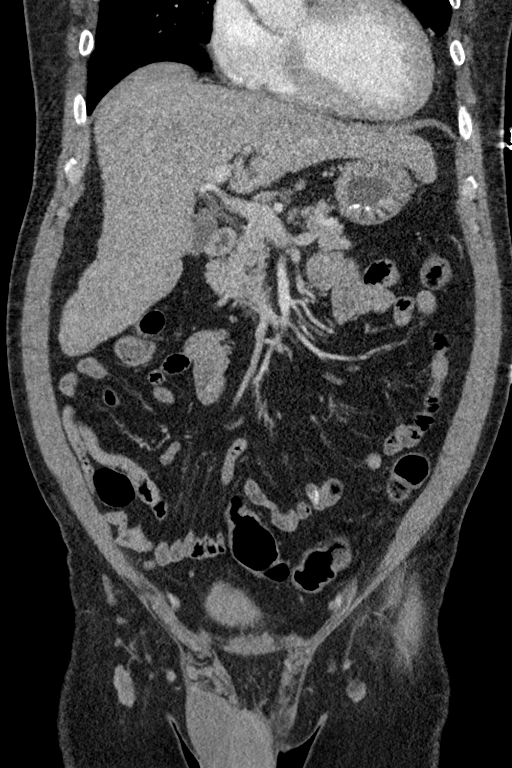
[im 56/125  soft-tissue]
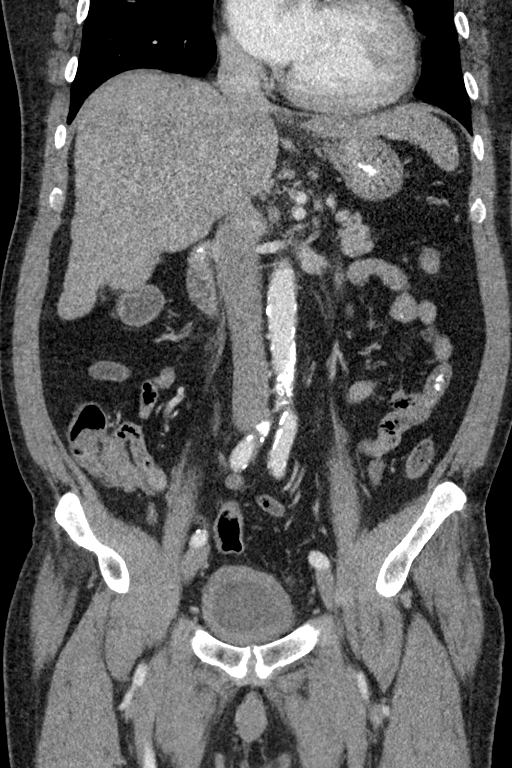
[im 69/125  soft-tissue]
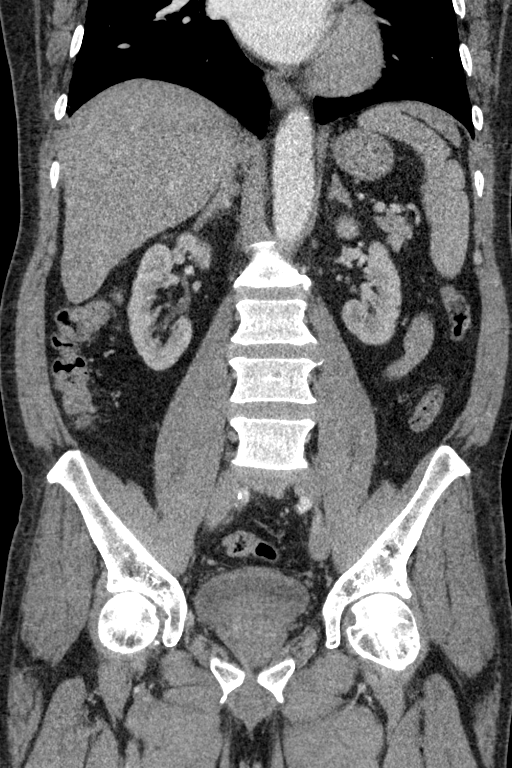

[15 of 46 positions shown; findings below may reference images not displayed]

FINDINGS: Lower chest: Consolidation and adjacent ground-glass density in the
medial right lung base. No pleural. Cardiomegaly.

Hepatobiliary: Nodular liver contour with diffuse decreased density.
No focal hepatic abnormality. Negative for calcified gallstones or
biliary dilatation

Pancreas: Unremarkable. No pancreatic ductal dilatation or
surrounding inflammatory changes.

Spleen: Normal in size without focal abnormality.

Adrenals/Urinary Tract: Adrenal glands are within normal limits.
Cyst in the mid left kidney. No hydronephrosis. Thick-walled
appearance of the urinary bladder

Stomach/Bowel: Stomach is within normal limits. Appendix appears
normal. No evidence of bowel wall thickening, distention, or
inflammatory changes.

Vascular/Lymphatic: Moderate aortic atherosclerosis. Ectatic distal
descending thoracic/proximal abdominal aorta. No significantly
enlarged lymph nodes

Reproductive: Enlarged prostate gland.

Other: Negative for free air or free fluid. Fat in the right greater
than left inguinal canal. High-riding right testis versus small
hydrocele.

Musculoskeletal: No acute or significant osseous findings.
IMPRESSION: 1. Negative for appendicitis or right lower quadrant inflammatory
process
2. Partial consolidation and surrounding ground-glass density in the
medial right lung base, suspicious for a pneumonia
3. Cirrhotic appearing liver.
4. Thick-walled appearance of the bladder, cystitis versus chronic
obstruction. Enlarged prostate gland
5. High-riding right testicle versus right hydrocele.

## 2019-02-22 IMAGING — DX DG CHEST 2V
2 series · 2 of 2 positions shown · non-contrast
Comparison: CT chest 12/16/2017, radiograph 12/16/2017

CLINICAL DATA: Syncope with cough

EXAM:
CHEST  2 VIEW

[chest ap]
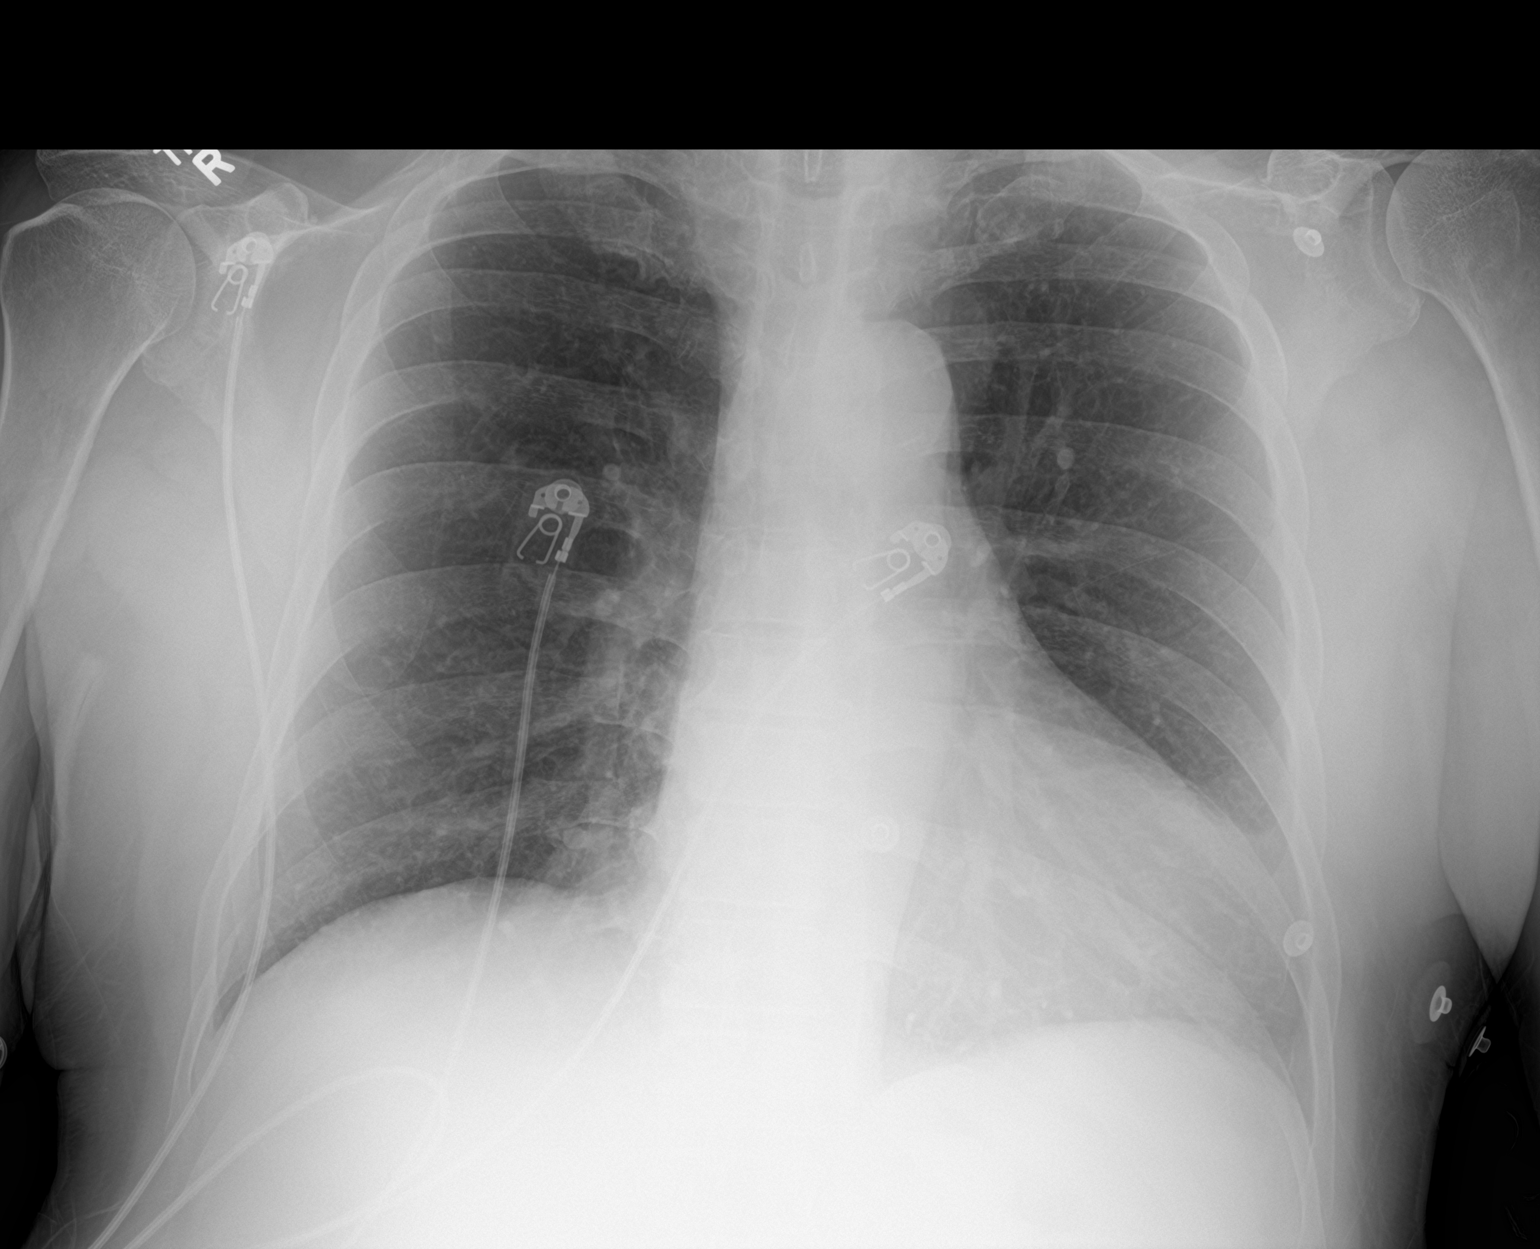

[chest lat]
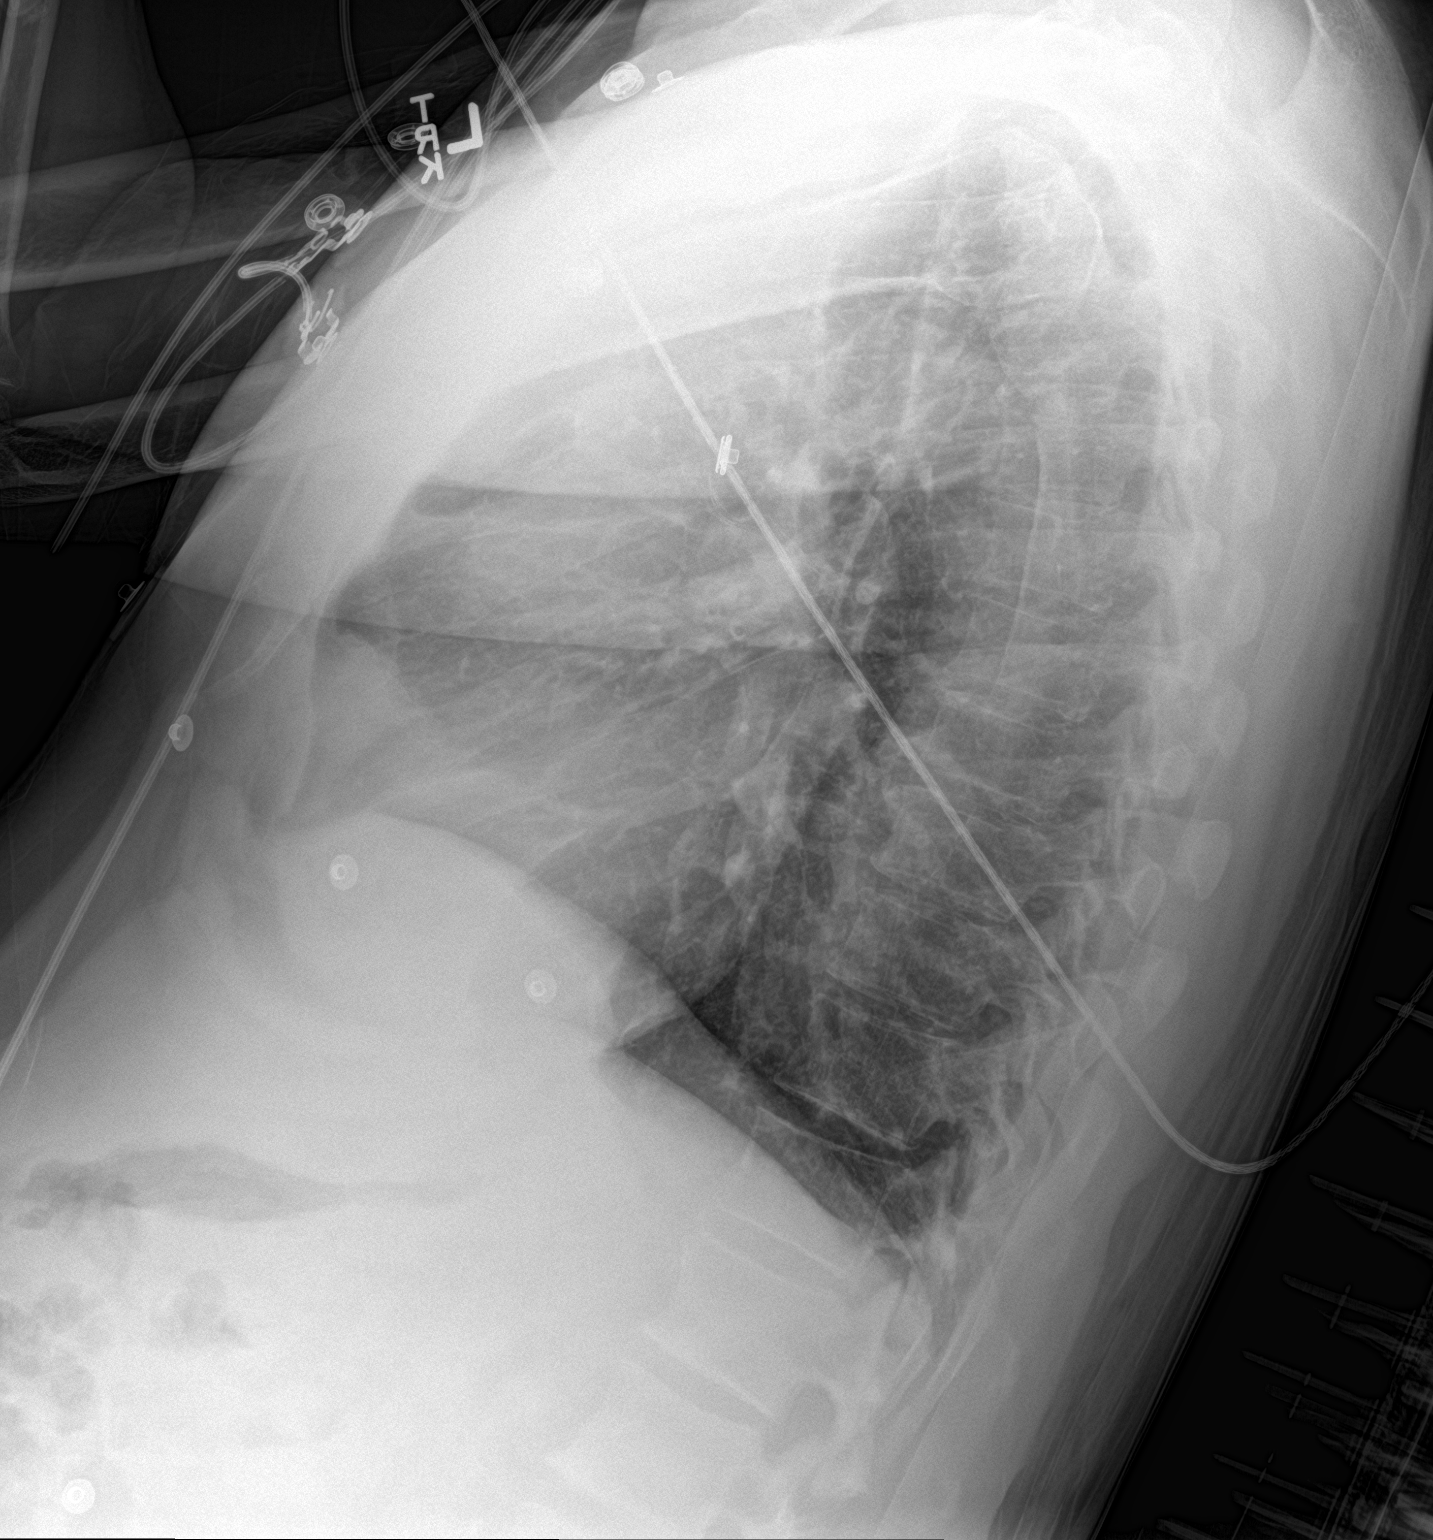

[2 of 2 positions shown; findings below may reference images not displayed]

FINDINGS: Borderline enlarged heart. No focal pulmonary infiltrate or
effusion. Mild aortic atherosclerosis. No pneumothorax.
IMPRESSION: No active cardiopulmonary disease. Borderline enlargement of the
heart size.

## 2019-02-22 MED ORDER — SPIRONOLACTONE 25 MG PO TABS
25.0000 mg | ORAL_TABLET | Freq: Every day | ORAL | Status: DC
  Administered 2019-02-23 – 2019-02-24 (×2): 25 mg via ORAL
  Filled 2019-02-22 (×2): qty 1

## 2019-02-22 MED ORDER — INSULIN ASPART 100 UNIT/ML ~~LOC~~ SOLN
0.0000 [IU] | Freq: Three times a day (TID) | SUBCUTANEOUS | Status: DC
  Administered 2019-02-22: 2 [IU] via SUBCUTANEOUS
  Administered 2019-02-23: 3 [IU] via SUBCUTANEOUS
  Administered 2019-02-23 – 2019-02-24 (×2): 1 [IU] via SUBCUTANEOUS

## 2019-02-22 MED ORDER — PANTOPRAZOLE SODIUM 40 MG PO TBEC
40.0000 mg | DELAYED_RELEASE_TABLET | Freq: Every day | ORAL | Status: DC
  Administered 2019-02-22 – 2019-02-24 (×3): 40 mg via ORAL
  Filled 2019-02-22 (×3): qty 1

## 2019-02-22 MED ORDER — LORAZEPAM 2 MG/ML IJ SOLN
1.0000 mg | Freq: Four times a day (QID) | INTRAMUSCULAR | Status: DC | PRN
  Filled 2019-02-22: qty 1

## 2019-02-22 MED ORDER — APIXABAN 5 MG PO TABS
5.0000 mg | ORAL_TABLET | Freq: Two times a day (BID) | ORAL | Status: DC
  Administered 2019-02-23 – 2019-02-24 (×3): 5 mg via ORAL
  Filled 2019-02-22 (×4): qty 1

## 2019-02-22 MED ORDER — CARVEDILOL 6.25 MG PO TABS
6.2500 mg | ORAL_TABLET | Freq: Two times a day (BID) | ORAL | Status: DC
  Administered 2019-02-22 – 2019-02-24 (×5): 6.25 mg via ORAL
  Filled 2019-02-22 (×5): qty 1

## 2019-02-22 MED ORDER — INSULIN GLARGINE 100 UNIT/ML ~~LOC~~ SOLN
35.0000 [IU] | Freq: Two times a day (BID) | SUBCUTANEOUS | Status: DC
  Administered 2019-02-23 – 2019-02-24 (×2): 35 [IU] via SUBCUTANEOUS
  Filled 2019-02-22 (×5): qty 0.35

## 2019-02-22 MED ORDER — ATORVASTATIN CALCIUM 40 MG PO TABS
40.0000 mg | ORAL_TABLET | Freq: Every day | ORAL | Status: DC
  Administered 2019-02-22 – 2019-02-24 (×3): 40 mg via ORAL
  Filled 2019-02-22 (×3): qty 1

## 2019-02-22 MED ORDER — ADULT MULTIVITAMIN W/MINERALS CH
1.0000 | ORAL_TABLET | Freq: Every day | ORAL | Status: DC
  Administered 2019-02-22 – 2019-02-24 (×3): 1 via ORAL
  Filled 2019-02-22 (×3): qty 1

## 2019-02-22 MED ORDER — LORAZEPAM 1 MG PO TABS
1.0000 mg | ORAL_TABLET | Freq: Four times a day (QID) | ORAL | Status: DC | PRN
  Administered 2019-02-23: 1 mg via ORAL
  Filled 2019-02-22 (×2): qty 1

## 2019-02-22 MED ORDER — VITAMIN B-1 100 MG PO TABS
100.0000 mg | ORAL_TABLET | Freq: Every day | ORAL | Status: DC
  Administered 2019-02-22 – 2019-02-24 (×3): 100 mg via ORAL
  Filled 2019-02-22 (×3): qty 1

## 2019-02-22 MED ORDER — FOLIC ACID 1 MG PO TABS
1.0000 mg | ORAL_TABLET | Freq: Every day | ORAL | Status: DC
  Administered 2019-02-22 – 2019-02-24 (×3): 1 mg via ORAL
  Filled 2019-02-22 (×3): qty 1

## 2019-02-22 MED ORDER — POTASSIUM CHLORIDE CRYS ER 20 MEQ PO TBCR
20.0000 meq | EXTENDED_RELEASE_TABLET | Freq: Every day | ORAL | Status: DC
  Administered 2019-02-22 – 2019-02-24 (×3): 20 meq via ORAL
  Filled 2019-02-22 (×3): qty 1

## 2019-02-22 NOTE — Progress Notes (Signed)
Pt nauseous- vomited clear liquid 2x. Gave Zofran 4mg  IV- will continue to monitor.
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
## 2019-02-22 NOTE — ED Notes (Signed)
Ordered bfast tray 

## 2019-02-22 NOTE — ED Notes (Signed)
Report given to Alma Friendly, RN on floor

## 2019-02-22 NOTE — ED Notes (Signed)
RN sent 1 visitor back 

## 2019-02-22 NOTE — Progress Notes (Signed)
  Echocardiogram 2D Echocardiogram has been performed.  Brad [name] 02/22/2019, 5:51 PM

## 2019-02-22 NOTE — Progress Notes (Addendum)
Pt still nauseous-had multiple occurrences of dry-heaving while in the bathroom.  Comments per patient "I relapsed with cocaine and will never do that again and I just don't feel right." Will try ativan and monitor

## 2019-02-22 NOTE — ED Notes (Signed)
Heart Healthy Diet was ordered for Lunch. 

## 2019-02-22 NOTE — Progress Notes (Signed)
Critical Value: Troponin 0.04  Notified MD.

## 2019-02-23 ENCOUNTER — Inpatient Hospital Stay (HOSPITAL_COMMUNITY): Payer: Medicaid Other

## 2019-02-23 DIAGNOSIS — I472 Ventricular tachycardia: Secondary | ICD-10-CM

## 2019-02-23 DIAGNOSIS — F1010 Alcohol abuse, uncomplicated: Secondary | ICD-10-CM

## 2019-02-23 DIAGNOSIS — K219 Gastro-esophageal reflux disease without esophagitis: Secondary | ICD-10-CM

## 2019-02-23 DIAGNOSIS — D649 Anemia, unspecified: Secondary | ICD-10-CM

## 2019-02-23 DIAGNOSIS — F1410 Cocaine abuse, uncomplicated: Secondary | ICD-10-CM

## 2019-02-23 DIAGNOSIS — I10 Essential (primary) hypertension: Secondary | ICD-10-CM

## 2019-02-23 DIAGNOSIS — E871 Hypo-osmolality and hyponatremia: Secondary | ICD-10-CM

## 2019-02-23 LAB — OSMOLALITY: Osmolality: 270 mOsm/kg — ABNORMAL LOW (ref 275–295)

## 2019-02-23 LAB — CBC
HCT: 35.4 % — ABNORMAL LOW (ref 39.0–52.0)
Hemoglobin: 12 g/dL — ABNORMAL LOW (ref 13.0–17.0)
MCH: 29.9 pg (ref 26.0–34.0)
MCHC: 33.9 g/dL (ref 30.0–36.0)
MCV: 88.3 fL (ref 80.0–100.0)
Platelets: 157 10*3/uL (ref 150–400)
RBC: 4.01 MIL/uL — ABNORMAL LOW (ref 4.22–5.81)
RDW: 15.6 % — ABNORMAL HIGH (ref 11.5–15.5)
WBC: 4.8 10*3/uL (ref 4.0–10.5)
nRBC: 0 % (ref 0.0–0.2)

## 2019-02-23 LAB — BASIC METABOLIC PANEL
Anion gap: 7 (ref 5–15)
BUN: 23 mg/dL — ABNORMAL HIGH (ref 6–20)
CO2: 25 mmol/L (ref 22–32)
Calcium: 9 mg/dL (ref 8.9–10.3)
Chloride: 92 mmol/L — ABNORMAL LOW (ref 98–111)
Creatinine, Ser: 1.96 mg/dL — ABNORMAL HIGH (ref 0.61–1.24)
GFR calc Af Amer: 42 mL/min — ABNORMAL LOW (ref >60)
GFR calc non Af Amer: 36 mL/min — ABNORMAL LOW (ref >60)
Glucose, Bld: 111 mg/dL — ABNORMAL HIGH (ref 70–99)
Potassium: 4.7 mmol/L (ref 3.5–5.1)
Sodium: 124 mmol/L — ABNORMAL LOW (ref 135–145)

## 2019-02-23 LAB — GLUCOSE, CAPILLARY
Glucose-Capillary: 112 mg/dL — ABNORMAL HIGH (ref 70–99)
Glucose-Capillary: 209 mg/dL — ABNORMAL HIGH (ref 70–99)
Glucose-Capillary: 128 mg/dL — ABNORMAL HIGH (ref 70–99)
Glucose-Capillary: 116 mg/dL — ABNORMAL HIGH (ref 70–99)

## 2019-02-23 LAB — TROPONIN I: Troponin I: 0.03 ng/mL (ref <0.03)

## 2019-02-23 LAB — OSMOLALITY, URINE: Osmolality, Ur: 245 mOsm/kg — ABNORMAL LOW (ref 300–900)

## 2019-02-23 MED ORDER — ALBUTEROL SULFATE (2.5 MG/3ML) 0.083% IN NEBU
2.5000 mg | INHALATION_SOLUTION | RESPIRATORY_TRACT | Status: DC | PRN
  Administered 2019-02-23 (×2): 2.5 mg via RESPIRATORY_TRACT
  Filled 2019-02-23 (×2): qty 3

## 2019-02-23 NOTE — Progress Notes (Signed)
Patient c/o SOB, denies cough.  BP 107/63, placed on 2L [place], sats 100%.  Reports dizziness  with sitting up.  Dr. Wyonia Hough notified.
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
## 2019-02-23 NOTE — Discharge Instructions (Signed)

## 2019-02-24 DIAGNOSIS — I2510 Atherosclerotic heart disease of native coronary artery without angina pectoris: Secondary | ICD-10-CM

## 2019-02-24 LAB — GLUCOSE, CAPILLARY
Glucose-Capillary: 121 mg/dL — ABNORMAL HIGH (ref 70–99)
Glucose-Capillary: 103 mg/dL — ABNORMAL HIGH (ref 70–99)

## 2019-02-24 LAB — BASIC METABOLIC PANEL
Anion gap: 9 (ref 5–15)
BUN: 33 mg/dL — ABNORMAL HIGH (ref 6–20)
CO2: 20 mmol/L — ABNORMAL LOW (ref 22–32)
Calcium: 8.8 mg/dL — ABNORMAL LOW (ref 8.9–10.3)
Chloride: 93 mmol/L — ABNORMAL LOW (ref 98–111)
Creatinine, Ser: 2.21 mg/dL — ABNORMAL HIGH (ref 0.61–1.24)
GFR calc Af Amer: 36 mL/min — ABNORMAL LOW (ref >60)
GFR calc non Af Amer: 31 mL/min — ABNORMAL LOW (ref >60)
Glucose, Bld: 141 mg/dL — ABNORMAL HIGH (ref 70–99)
Potassium: 4.3 mmol/L (ref 3.5–5.1)
Sodium: 122 mmol/L — ABNORMAL LOW (ref 135–145)

## 2019-02-24 MED ORDER — ONDANSETRON 4 MG PO TBDP
4.0000 mg | ORAL_TABLET | Freq: Once | ORAL | Status: AC
  Administered 2019-02-24: 4 mg via ORAL
  Filled 2019-02-24: qty 1

## 2019-02-24 NOTE — Progress Notes (Signed)
Initial Nutrition Assessment  DOCUMENTATION CODES:   Not applicable  INTERVENTION:  Ensure Enlive po BID, each supplement provides 350 kcal and 20 grams of protein   NUTRITION DIAGNOSIS:   Predicted suboptimal nutrient intake related to chronic illness, social / environmental circumstances as evidenced by estimated needs, per patient/family report, other (comment)(MD notes; pt reports daily EtOH abuse).   GOAL:   Patient will meet greater than or equal to 90% of their needs   MONITOR:   PO intake, Weight trends, I & O's, Labs, Supplement acceptance  REASON FOR ASSESSMENT:   Malnutrition Screening Tool    ASSESSMENT:  58 year old male with medical history significant of osteoarthritis, a-fib, prostate cancer, CHF (20-25%), CKD3, T2DM, GERD, hep C, heroin, cocaine, EtOH abuse admitted for acute CHF exacerbation requiring IV diuresis   Fluctuating weights documented over the past 6 months likely due to fluid shifts related to CHF. Patient with BLE +1 edema today.   Patient poor historian, limited engagement with RD at visit. Patient with blanket pulled over head and lying on his side reporting a headache. Patient recalls 2-3 meals/day that he prepares; unable to obtain recall other than chx. 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 He could not provide a more recent wt history. Wt loss could be attributed to fluid, no fat/muscle wasting found on NFPE. NFPE was limited d/t patient unwillingness to participate   Medications reviewed and include: Folic acid, Lasix, SSI, Lantus 35 Units BID, MVI, Protonix, KCl 29 mEq, Thiamine  Labs: Na 122 (L) Glucose 141 (H) Lab Results  Component Value Date   HGBA1C 7.5 (A) 01/17/2019    I/O reviewed: +373.9 since admit  NUTRITION - FOCUSED PHYSICAL EXAM:    Most Recent Value  Orbital Region  Mild depletion   Upper Arm Region  No depletion  Thoracic and Lumbar Region  No depletion  Buccal Region  No depletion  Temple Region  No depletion  Clavicle Bone Region  Unable to assess  Clavicle and Acromion Bone Region  Unable to assess  Scapular Bone Region  No depletion  Dorsal Hand  No depletion  Patellar Region  Unable to assess  Anterior Thigh Region  Unable to assess  Posterior Calf Region  No depletion  Edema (RD Assessment)  Mild  Hair  Reviewed  Eyes  Unable to assess  Mouth  Unable to assess  Skin  Reviewed  Nails  Reviewed       Diet Order:   Diet Order            Diet Carb Modified        Diet Heart Room service appropriate? Yes; Fluid consistency: Thin  Diet effective now              EDUCATION NEEDS:   Not appropriate for education at this time  Skin:  Skin Assessment: Reviewed RN Assessment  Last BM:  3/2  Height:   Ht Readings from Last 1 Encounters:  02/22/19 5\' 11"  (1.803 m)    Weight:   Wt Readings from Last 1 Encounters:  02/24/19 83.7 kg   02/14/19 77.1 kg  02/07/19 82 kg  01/17/19 81.4 kg  11/08/18 75.9 kg  10/12/18 77.7 kg    Ideal Body Weight:  78.2 kg  BMI:  Body mass index is 25.73 kg/m.  Estimated Nutritional  Needs:   Kcal:  2100-2400  Protein:  100-118  Fluid:  >2L    Lajuan Lines, RD, LDN  After Hours/Weekend Pager: [phone]
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
## 2019-02-25 ENCOUNTER — Telehealth: Payer: Self-pay

## 2019-02-25 NOTE — Telephone Encounter (Signed)
Transition Care Management Follow-up Telephone Call Date of discharge and from where: 02/24/2019, [facility]  Call placed to # [phone] , message left requesting call back to this CM # [phone]

## 2019-02-28 ENCOUNTER — Telehealth: Payer: Self-pay

## 2019-02-28 ENCOUNTER — Other Ambulatory Visit: Payer: Self-pay | Admitting: Family Medicine

## 2019-02-28 ENCOUNTER — Other Ambulatory Visit (HOSPITAL_COMMUNITY): Payer: Self-pay

## 2019-02-28 DIAGNOSIS — E1140 Type 2 diabetes mellitus with diabetic neuropathy, unspecified: Secondary | ICD-10-CM

## 2019-02-28 NOTE — Progress Notes (Signed)
Came by for home visit, pt recently d/c from [place] was not answering phone so I just came by but he did not answer the door and no answer on phone again.  [name] was informed that I  wasn't able to make contact with him.   Marylouise Stacks, EMT-Paramedic  02/28/19

## 2019-02-28 NOTE — Telephone Encounter (Addendum)
.    This was opened in error.

## 2019-02-28 NOTE — Telephone Encounter (Signed)
Transition Care Management Follow-up Telephone Call  #2  Date of discharge and from where: 02/24/2019, [facility]  Call placed to # [phone] and a message was left requesting a call back to # [phone]

## 2019-02-28 NOTE — Telephone Encounter (Signed)
Brad [name], EMT was at patient's residence and there was no answer at door and no answer on phone.  Call placed to patient's contact, Mr Brad [name], to check on status of patient.   Mr Brad [name] said that he took patient home when he was discharged from the hospital 02/24/2019 and has not heard from him since then.  Update provided to [place], EMT

## 2019-03-01 ENCOUNTER — Telehealth (HOSPITAL_COMMUNITY): Payer: Self-pay

## 2019-03-01 ENCOUNTER — Telehealth: Payer: Self-pay

## 2019-03-01 NOTE — Telephone Encounter (Signed)
Transition Care Management Follow-up Telephone Call  Date of discharge and from where: 02/24/2019, [facility]  How have you been since you were released from the hospital? He stated that he is feeling " great."  He also spoke about the fact that he relapsed with cocaine and he understands that this is adversely affecting his heart.  He said that the next time he uses cocaine he could" wake up dead" and he knows that he needs to quit.   He then spoke at length about his housing situation and that the woman he is renting a room from now wants him to pay for her car repairs - $500. he has already paid her at least $1200 and he has been there for 2 months She also may have changed the locks as he now needs a new key to get in.  He is interested in living at [facility].  He said that he has tried to call the director there and has not been successful getting through. He understands that most likely he will be put on a a waiting list at [facility].   He also noted that he will be off probation in 5 days.   Any questions or concerns? Other than housing, he was concerned that he did not hear from [name],EMT yesterday.. this CM explained that Nira Conn was sick yesterday and Joellen Jersey, EMT,tried reaching him by phone and stopped by his house and no answer.  He also noted that the landlord at his current residence does not know that he is living there.   Items Reviewed:  Did the pt receive and understand the discharge instructions provided? He said that he has the instructions and has no questions.   Medications obtained and verified? He said that he has all of his medications, just picked all of them up yesterday. He did not have the medication list with him at the time of this call as he was not at home. Instructed him to call this CM if he has any questions.   Any new allergies since your discharge?none reported  Do you have support at home? No reliable support other than friend, Mr  Ace Gins  Other (ie: DME, Home Health, etc)   Has community paramedicine   Has cane, glucometer  Functional Questionnaire: (I = Independent and D = Dependent) ADL's: independent with cane   Follow up appointments reviewed:    PCP Hospital f/u appt confirmed? Appointment scheduled for 03/08/2019 @ [facility] f/u appt confirmed?needs to schedule appointment with cardiology  Are transportation arrangements needed? He has [employer] and can use the city bus for free  If their condition worsens, is the pt aware to call  their PCP or go to the ED? yes  Was the patient provided with contact information for the PCP's office or ED?he has the clinic phone number  Was the pt encouraged to call back with questions or concerns? Yes   This CM attempted to call [facility] # [phone] and the phone rings as a fax machine

## 2019-03-01 NOTE — Telephone Encounter (Signed)
Spoke with Brad [name] today, Brad [name] advised, "I have to get out of this apartment". Brad [name] also advised he is afraid of using cocaine due to his roommate also using same. I advised Brad [name] I would be seeking living and housing options. Will follow up with Brad [name].

## 2019-03-03 ENCOUNTER — Telehealth: Payer: Self-pay

## 2019-03-03 NOTE — Telephone Encounter (Signed)
Call placed to  [place] # [phone], to check on status of housing availability.  message left requesting a call back to this CM # [phone].

## 2019-03-07 ENCOUNTER — Other Ambulatory Visit: Payer: Self-pay

## 2019-03-07 ENCOUNTER — Telehealth: Payer: Self-pay

## 2019-03-07 NOTE — Progress Notes (Signed)
Paramedicine Encounter    Patient ID: Brad [name], male    DOB: 31-Dec-1960, 58 y.o.   MRN: [id number]   Patient Care Team: Charlott Rakes, MD as PCP - General (Family Medicine) Debara Pickett Nadean Corwin, MD as PCP - Cardiology (Cardiology) Charlott Rakes, MD (Family Medicine)  Patient Active Problem List   Diagnosis Date Noted  . Acute on chronic systolic (congestive) heart failure ([place]) 02/22/2019  . Acute on chronic systolic congestive heart failure ([place]) 02/21/2019  . Alcohol abuse 02/21/2019  . Frequent falls 01/17/2019  . Severe mitral regurgitation   . Fall 11/01/2018  . Hyponatremia 10/31/2018  . Abdominal distention   . Acute systolic congestive heart failure ([place])   . Chronic atrial fibrillation   . Chronic hyponatremia 08/16/2018  . NSVT (nonsustained ventricular tachycardia) ([place])   . Concussion with loss of consciousness   . Scalp laceration   . Trauma   . Permanent atrial fibrillation   . Hypertensive heart disease   . Shortness of breath   . ACS (acute coronary syndrome) ([place]) 07/25/2018  . Pyogenic inflammation of bone ([place])   . Ankle swelling, right 04/30/2018  . Cirrhosis ([place]) 03/23/2018  . Right ankle pain 03/23/2018  . Acute respiratory failure with hypoxia ([place]) 11/06/2017  . Syncope 08/07/2017  . Acute on chronic combined systolic and diastolic heart failure ([place]) 07/09/2017  . Atrial flutter ([place]) 07/09/2017  . Pre-syncope 07/08/2017  . Neuropathy 05/08/2017  . Substance induced mood disorder ([place]) 10/06/2016  . CVA (cerebral vascular accident) ([place]) 09/18/2016  . Left sided numbness   . Homelessness 08/21/2016  . S/P ORIF (open reduction internal fixation) fracture 08/01/2016  . CAD (coronary artery disease), native coronary artery 07/30/2016  . Surgery, elective   . Insomnia 07/22/2016  . Acute diastolic heart failure ([place])   . NSTEMI (non-ST elevated myocardial infarction) ([place])   . Normocytic anemia 07/05/2016  . Thrombocytopenia ([place])  07/05/2016  . AKI (acute kidney injury) ([place])   . Cocaine abuse ([place]) 07/02/2016  . Essential hypertension 07/01/2016  . Type 2 diabetes mellitus with complication, without long-term current use of insulin ([place]) 07/01/2016  . Hypokalemia 07/01/2016  . CKD (chronic kidney disease) stage 3, GFR 30-59 ml/min (HCC) 07/01/2016  . Painful diabetic neuropathy ([place]) 07/01/2016  . Polysubstance abuse ([place]) 05/27/2016  . Chronic hepatitis C with cirrhosis ([place]) 05/27/2016  . Chronic diastolic congestive heart failure ([place]) 01/31/2016  . Depression 04/21/2012  . GERD (gastroesophageal reflux disease) 02/16/2012  . History of drug abuse ([place])   . Heroin addiction ([place]) 01/29/2012    Current Outpatient Medications:  .  amitriptyline (ELAVIL) 75 MG tablet, Take 1 tablet (75 mg total) by mouth at bedtime., Disp: 30 tablet, Rfl: 6 .  amLODipine (NORVASC) 10 MG tablet, Take 10 mg by mouth daily., Disp: , Rfl:  .  atorvastatin (LIPITOR) 40 MG tablet, Take 1 tablet (40 mg total) by mouth daily., Disp: 90 tablet, Rfl: 3 .  carvedilol (COREG) 3.125 MG tablet, TAKE 1 TABLET (3.125 MG TOTAL) BY MOUTH 2 (TWO) TIMES DAILY WITH A MEAL., Disp: 60 tablet, Rfl: 2 .  cetirizine (ZYRTEC) 10 MG tablet, TAKE 1 TABLET (10 MG TOTAL) BY MOUTH DAILY., Disp: 30 tablet, Rfl: 2 .  furosemide (LASIX) 40 MG tablet, Take 1 tablet (40 mg total) by mouth 2 (two) times daily., Disp: 180 tablet, Rfl: 3 .  gabapentin (NEURONTIN) 300 MG capsule, TAKE 2 CAPSULES (600 MG TOTAL) BY MOUTH 2 (TWO) TIMES DAILY., Disp: 120 capsule, Rfl: 3 .  losartan (COZAAR) 25 MG tablet, Take 25 mg by mouth daily., Disp: , Rfl:  .  spironolactone (ALDACTONE) 25 MG tablet, Take 1 tablet (25 mg total) by mouth daily., Disp: 90 tablet, Rfl: 3 .  ACCU-CHEK FASTCLIX LANCETS MISC, Use as directed three times daily, Disp: 102 each, Rfl: 12 .  Blood Glucose Monitoring Suppl (ACCU-CHEK GUIDE) w/Device KIT, 1 each by Does not apply route 3 (three) times daily., Disp:  1 kit, Rfl: 0 .  diclofenac sodium (VOLTAREN) 1 % GEL, Apply 4 g topically 4 (four) times daily as needed (to painful sites). (Patient not taking: Reported on 03/07/2019), Disp: 100 g, Rfl: 3 .  EASY COMFORT PEN NEEDLES 31G X 8 MM MISC, USE AS DIRECTED TWICE A DAY, Disp: 100 each, Rfl: 5 .  glucose blood (ACCU-CHEK GUIDE) test strip, Use as directed three times daily, Disp: 100 each, Rfl: 12 .  hydrocortisone 2.5 % cream, Apply 1 application topically 2 (two) times daily as needed (rash). , Disp: , Rfl: 5 .  Insulin Glargine (LANTUS SOLOSTAR) 100 UNIT/ML Solostar Pen, Inject 35 Units into the skin 2 (two) times daily. (Patient not taking: Reported on 03/07/2019), Disp: 30 mL, Rfl: 5 .  omeprazole (PRILOSEC) 20 MG capsule, Take 1 capsule (20 mg total) by mouth daily. (Patient not taking: Reported on 03/07/2019), Disp: 90 capsule, Rfl: 3 .  sildenafil (VIAGRA) 50 MG tablet, Take 1 tablet (50 mg total) by mouth daily as needed for erectile dysfunction. At least 24 hours between doses (Patient not taking: Reported on 03/07/2019), Disp: 10 tablet, Rfl: 0 Allergies  Allergen Reactions  . Angiotensin Receptor Blockers Anaphylaxis and Other (See Comments)    (Angioedema also with Lisinopril, therefore ARB's are contraindicated)  . Lisinopril Anaphylaxis and Other (See Comments)    Throat swells  . Pamelor [Nortriptyline Hcl] Anaphylaxis and Swelling    Throat swells     Social History   Socioeconomic History  . Marital status: Married    Spouse name: Not on file  . Number of children: 3  . Years of education: 2y college  . Highest education level: Not on file  Occupational History  . 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 Financial resource strain: Not on file  . Food insecurity:    Worry: Not on file    Inability: Not on file  . Transportation needs:    Medical: Not on file    Non-medical: Not on file  Tobacco Use  . Smoking status: Current Every  Day Smoker    Packs/day: 0.50    Years: 28.00    Pack years: 14.00    Types: Cigarettes  . Smokeless tobacco: Never Used  Substance and Sexual Activity  . Alcohol use: Yes    Alcohol/week: 23.0 standard drinks    Types: 23 Cans of beer per week    Comment: 08/17/2018 "I drink ~ 40oz beer/day"  . Drug use: Yes    Types: IV, Cocaine, Heroin    Comment: 08/17/2018 "no heroin since 2013; I use cocaine ~ once/month, last use 10/28/2018"  . Sexual activity: Not on file  Lifestyle  . Physical activity:    Days per week: Not on file    Minutes per session: Not on file  . Stress: Not on file  Relationships  . Social connections:    Talks on phone: Not on file    Gets together: Not on file    Attends  religious service: Not on file    Active member of club or organization: Not on file    Attends meetings of clubs or organizations: Not on file    Relationship status: Not on file  . Intimate partner violence:    Fear of current or ex partner: Not on file    Emotionally abused: Not on file    Physically abused: Not on file    Forced sexual activity: Not on file  Other Topics Concern  . Not on file  Social History Narrative   ** Merged History Encounter **       Incarcerated from 2006-2010, then 10/2011-12/2011.  Has been trying to get sober (no heroin, alcohol since 10/2011).     Physical Exam Vitals signs reviewed.  HENT:     Head: Normocephalic.     Nose: Rhinorrhea present.  Eyes:     Pupils: Pupils are equal, round, and reactive to light.  Neck:     Musculoskeletal: Normal range of motion.  Cardiovascular:     Rate and Rhythm: Normal rate and regular rhythm.     Pulses: Normal pulses.  Pulmonary:     Effort: Pulmonary effort is normal. No respiratory distress.  Abdominal:     General: There is no distension.     Palpations: Abdomen is soft.  Musculoskeletal: Normal range of motion.  Skin:    General: Skin is warm.     Capillary Refill: Capillary refill takes less  than 2 seconds.  Neurological:     General: No focal deficit present.     Mental Status: He is alert. Mental status is at baseline.  Psychiatric:        Mood and Affect: Mood normal.         Future Appointments  Date Time Provider [place]  03/08/2019 10:50 AM Charlott Rakes, MD [id number] None     ACTION: Home visit completed Next visit planned for 3/23   Today I visited Brad [name] and he greeted me at the door stating he was feeling okay. We walked back upstairs, he walked upstairs without difficulty. Brad [name] kept saying he needs to get out of his apartment, I brought him a packet of local apartments within his price range for him to call and ask about. He then explained his cell phone bill was not paid and he does not have the money to pay it. Brad [name] vitals were checked and he was weighed as noted in report. Medications were reviewed and pill box was filled. Medications consolidated and stored appropriately. Brad [name] stated he does not want to relapse on crack but states his roommate is currently using it and he is scared he may give in and use it. Brad [name] was reminded that using drugs is very detrimental for his health and could possibly result in death. He was also reminded that his probation is up and he needs to keep a clean record. Patient understood and mentioned [place] being where he wants to be. I contacted [employer] and spoke to [place] who stated they are postponing any orientations for two weeks due to the Coronavirus and will resume after two weeks. She instructed me to call back then to set up an orientation with them for Brad [name] also then explained he has no food and only has $16 left on his EBT card. Brad [name] was given a meal for today by me and he states he can get to a food pantry to get food if needed. Visit completed and next visit  scheduled for 3/23.   [id number]

## 2019-03-07 NOTE — Telephone Encounter (Signed)
Spoke with Brad [name] to schedule home visit today at 12:00. Mr. Koy stated last night he was short of breath but today was feeling better. Brad [name] stated he has been taking his medications and has not relapsed. I confirmed time for appointment, encounter to follow.

## 2019-03-08 ENCOUNTER — Telehealth: Payer: Self-pay

## 2019-03-08 ENCOUNTER — Ambulatory Visit: Payer: Medicaid Other | Admitting: Family Medicine

## 2019-03-08 NOTE — Telephone Encounter (Signed)
Tried to contact [name] and VM box is full no answer upon contact.

## 2019-03-10 ENCOUNTER — Encounter (HOSPITAL_COMMUNITY): Payer: Self-pay

## 2019-03-10 ENCOUNTER — Emergency Department (HOSPITAL_COMMUNITY): Payer: Medicaid Other

## 2019-03-10 ENCOUNTER — Emergency Department (HOSPITAL_COMMUNITY)
Admission: EM | Admit: 2019-03-10 | Discharge: 2019-03-11 | Disposition: A | Discharge status: Home / Self Care | Payer: Medicaid Other | Attending: Emergency Medicine | Admitting: Emergency Medicine

## 2019-03-10 ENCOUNTER — Other Ambulatory Visit: Payer: Self-pay

## 2019-03-10 DIAGNOSIS — E1122 Type 2 diabetes mellitus with diabetic chronic kidney disease: Secondary | ICD-10-CM | POA: Insufficient documentation

## 2019-03-10 DIAGNOSIS — Z8546 Personal history of malignant neoplasm of prostate: Secondary | ICD-10-CM | POA: Insufficient documentation

## 2019-03-10 DIAGNOSIS — I130 Hypertensive heart and chronic kidney disease with heart failure and stage 1 through stage 4 chronic kidney disease, or unspecified chronic kidney disease: Secondary | ICD-10-CM | POA: Diagnosis not present

## 2019-03-10 DIAGNOSIS — N183 Chronic kidney disease, stage 3 (moderate): Secondary | ICD-10-CM | POA: Diagnosis not present

## 2019-03-10 DIAGNOSIS — Z79899 Other long term (current) drug therapy: Secondary | ICD-10-CM | POA: Diagnosis not present

## 2019-03-10 DIAGNOSIS — F17210 Nicotine dependence, cigarettes, uncomplicated: Secondary | ICD-10-CM | POA: Diagnosis not present

## 2019-03-10 DIAGNOSIS — Z794 Long term (current) use of insulin: Secondary | ICD-10-CM | POA: Diagnosis not present

## 2019-03-10 DIAGNOSIS — J069 Acute upper respiratory infection, unspecified: Secondary | ICD-10-CM | POA: Diagnosis not present

## 2019-03-10 DIAGNOSIS — I509 Heart failure, unspecified: Secondary | ICD-10-CM

## 2019-03-10 DIAGNOSIS — R05 Cough: Secondary | ICD-10-CM | POA: Diagnosis present

## 2019-03-10 DIAGNOSIS — I5023 Acute on chronic systolic (congestive) heart failure: Secondary | ICD-10-CM | POA: Diagnosis not present

## 2019-03-10 DIAGNOSIS — B9789 Other viral agents as the cause of diseases classified elsewhere: Secondary | ICD-10-CM

## 2019-03-10 LAB — BASIC METABOLIC PANEL
Anion gap: 10 (ref 5–15)
BUN: 20 mg/dL (ref 6–20)
CO2: 24 mmol/L (ref 22–32)
Calcium: 8.7 mg/dL — ABNORMAL LOW (ref 8.9–10.3)
Chloride: 103 mmol/L (ref 98–111)
Creatinine, Ser: 1.51 mg/dL — ABNORMAL HIGH (ref 0.61–1.24)
GFR calc Af Amer: 58 mL/min — ABNORMAL LOW (ref >60)
GFR calc non Af Amer: 50 mL/min — ABNORMAL LOW (ref >60)
Glucose, Bld: 141 mg/dL — ABNORMAL HIGH (ref 70–99)
Potassium: 3.3 mmol/L — ABNORMAL LOW (ref 3.5–5.1)
Sodium: 137 mmol/L (ref 135–145)

## 2019-03-10 LAB — BRAIN NATRIURETIC PEPTIDE: B Natriuretic Peptide: 1867.4 pg/mL — ABNORMAL HIGH (ref 0.0–100.0)

## 2019-03-10 MED ORDER — IBUPROFEN 800 MG PO TABS
800.0000 mg | ORAL_TABLET | Freq: Once | ORAL | Status: AC
  Administered 2019-03-10: 800 mg via ORAL
  Filled 2019-03-10: qty 1

## 2019-03-10 MED ORDER — GUAIFENESIN-DM 100-10 MG/5ML PO SYRP
15.0000 mL | ORAL_SOLUTION | ORAL | Status: DC | PRN
  Administered 2019-03-10: 15 mL via ORAL
  Filled 2019-03-10: qty 20

## 2019-03-10 MED ORDER — FUROSEMIDE 10 MG/ML IJ SOLN
20.0000 mg | Freq: Once | INTRAMUSCULAR | Status: AC
  Administered 2019-03-10: 20 mg via INTRAVENOUS
  Filled 2019-03-10: qty 4

## 2019-03-10 NOTE — Discharge Instructions (Addendum)
You appear to have an upper respiratory infection (URI). An upper respiratory tract infection, or cold, is a viral infection of the air passages leading to the lungs. It is contagious and can be spread to others, especially during the first 3 or 4 days. It cannot be cured by antibiotics or other medicines. °RETURN IMMEDIATELY IF you develop shortness of breath, confusion or altered mental status, a new rash, become dizzy, faint, or poorly responsive, or are unable to be cared for at home. ° °

## 2019-03-10 NOTE — ED Notes (Signed)
Bed: WA22 Expected date:  Expected time:  Means of arrival:  Comments: Triage 7 

## 2019-03-10 NOTE — ED Triage Notes (Signed)
Patient BIB EMS for cough, shortness of breath, sore throat, runny nose x3 days. Patient reports he was at the barber shop this week and "someone was coughing." Patient afebrile, alert and oriented. Patient denies nausea/vomiting/diarrhea.
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
## 2019-03-10 NOTE — ED Notes (Signed)
Bed: WA01 Expected date:  Expected time:  Means of arrival:  Comments: TR7

## 2019-03-11 ENCOUNTER — Encounter (HOSPITAL_COMMUNITY): Payer: Self-pay | Admitting: *Deleted

## 2019-03-11 ENCOUNTER — Emergency Department (HOSPITAL_COMMUNITY)
Admission: EM | Admit: 2019-03-11 | Discharge: 2019-03-11 | Disposition: A | Discharge status: Home / Self Care | Payer: Medicaid Other | Source: Home / Self Care | Attending: Emergency Medicine | Admitting: Emergency Medicine

## 2019-03-11 ENCOUNTER — Other Ambulatory Visit: Payer: Self-pay

## 2019-03-11 DIAGNOSIS — N183 Chronic kidney disease, stage 3 (moderate): Secondary | ICD-10-CM | POA: Insufficient documentation

## 2019-03-11 DIAGNOSIS — I130 Hypertensive heart and chronic kidney disease with heart failure and stage 1 through stage 4 chronic kidney disease, or unspecified chronic kidney disease: Secondary | ICD-10-CM

## 2019-03-11 DIAGNOSIS — Z8546 Personal history of malignant neoplasm of prostate: Secondary | ICD-10-CM

## 2019-03-11 DIAGNOSIS — I5032 Chronic diastolic (congestive) heart failure: Secondary | ICD-10-CM | POA: Insufficient documentation

## 2019-03-11 DIAGNOSIS — F17210 Nicotine dependence, cigarettes, uncomplicated: Secondary | ICD-10-CM

## 2019-03-11 DIAGNOSIS — Z79899 Other long term (current) drug therapy: Secondary | ICD-10-CM | POA: Insufficient documentation

## 2019-03-11 DIAGNOSIS — E1122 Type 2 diabetes mellitus with diabetic chronic kidney disease: Secondary | ICD-10-CM | POA: Insufficient documentation

## 2019-03-11 DIAGNOSIS — B9789 Other viral agents as the cause of diseases classified elsewhere: Principal | ICD-10-CM

## 2019-03-11 DIAGNOSIS — J069 Acute upper respiratory infection, unspecified: Secondary | ICD-10-CM | POA: Insufficient documentation

## 2019-03-11 LAB — I-STAT TROPONIN, ED: Troponin i, poc: 0.06 ng/mL (ref 0.00–0.08)

## 2019-03-11 MED ORDER — ONDANSETRON 4 MG PO TBDP
4.0000 mg | ORAL_TABLET | Freq: Once | ORAL | Status: AC
  Administered 2019-03-11: 4 mg via ORAL
  Filled 2019-03-11: qty 1
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
## 2019-03-11 NOTE — ED Triage Notes (Signed)
Pt seen earlier and has been sitting @ the bus stop.  Pt c/o cough.

## 2019-03-11 NOTE — ED Notes (Signed)
Pt's oxygen saturation stayed around 98-100% while ambulating.

## 2019-03-11 NOTE — ED Notes (Signed)
Bed: [facility] Expected date:  Expected time:  Means of arrival:  Comments: 

## 2019-03-11 NOTE — ED Notes (Signed)
Bed: [facility] Expected date:  Expected time:  Means of arrival:  Comments: 

## 2019-03-14 ENCOUNTER — Telehealth: Payer: Self-pay

## 2019-03-14 NOTE — Telephone Encounter (Signed)
No answer upon calling, voicemail box full. Will attempt to call back later.

## 2019-03-14 NOTE — Telephone Encounter (Signed)
Attempted multiple calls to [place] today with no answer. Voicemail box is full. Will attempt to call throughout the week.

## 2019-03-19 IMAGING — CR DG CHEST 2V
2 series · 2 of 2 positions shown · non-contrast
Comparison: Chest x-ray dated February 19, 2018.

CLINICAL DATA: Chest pain and shortness of breath.

EXAM:
CHEST - 2 VIEW

[chest lat]
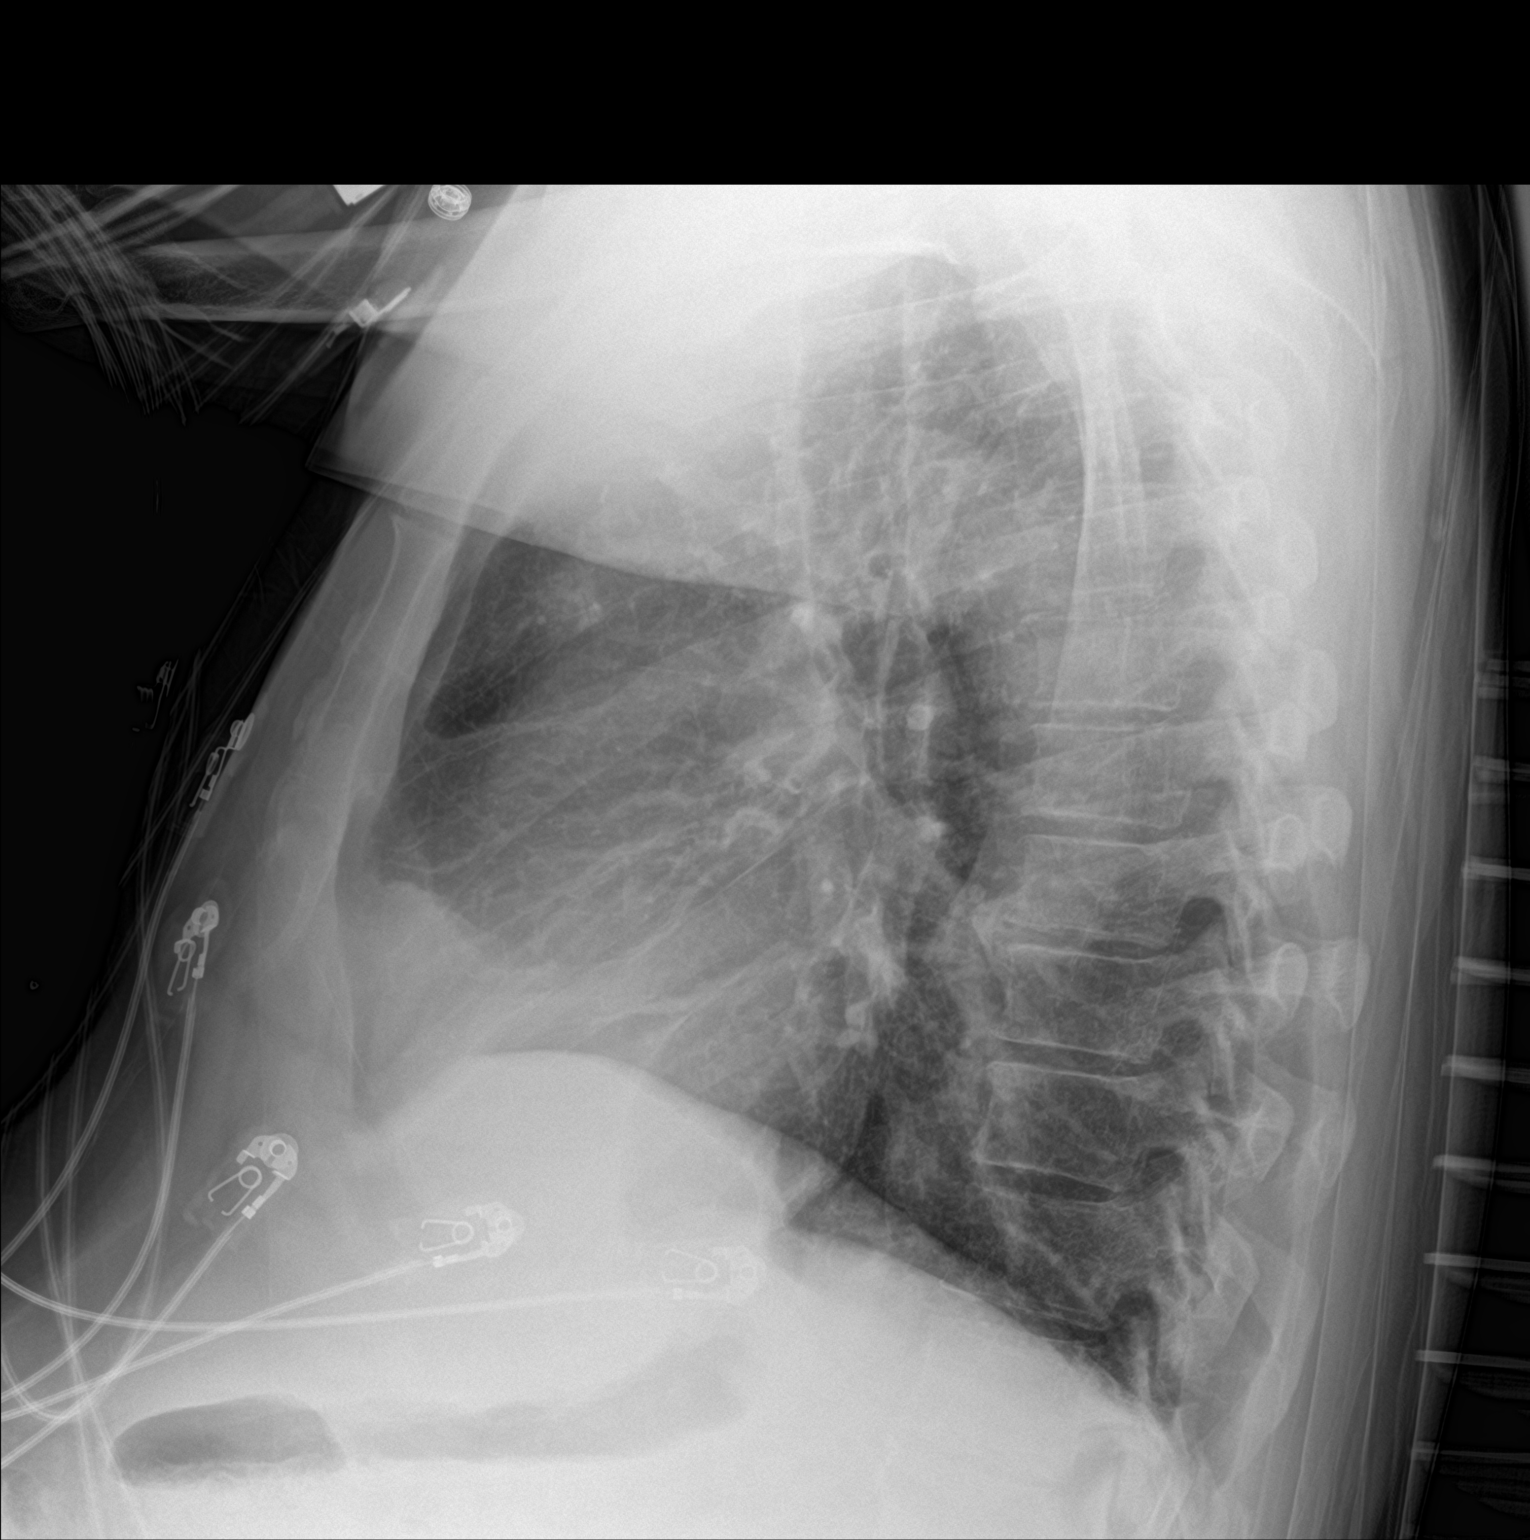

[chest ap]
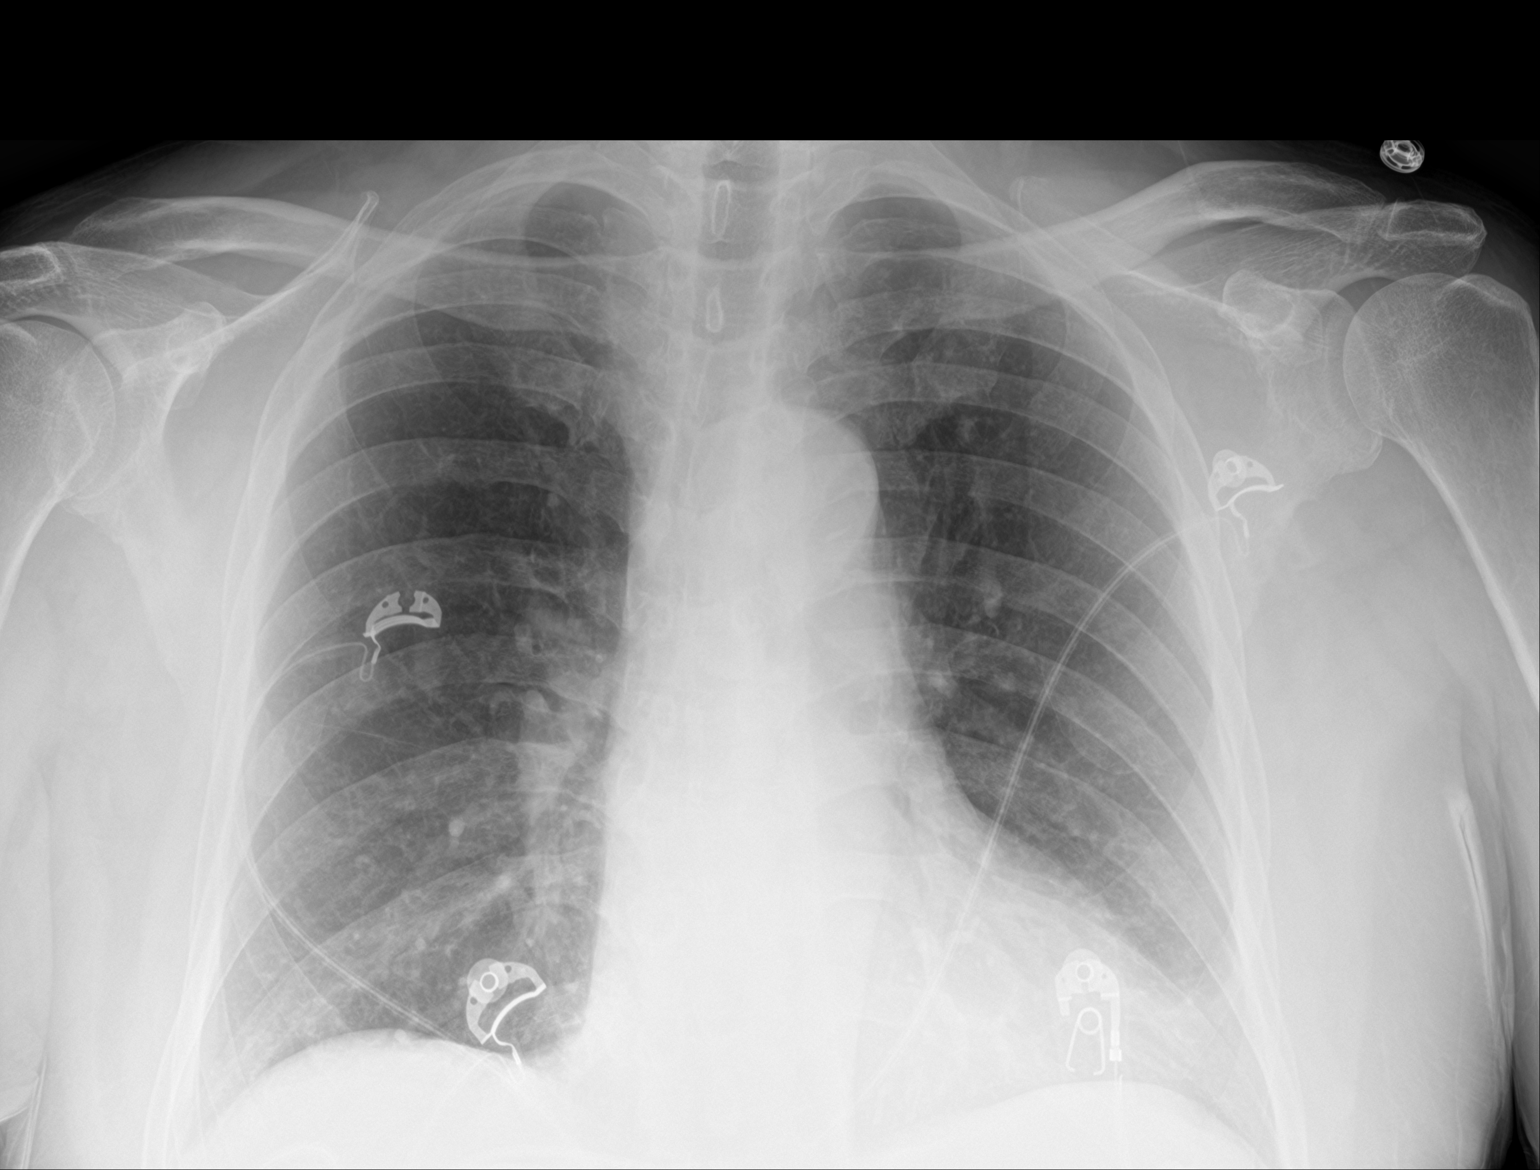

[2 of 2 positions shown; findings below may reference images not displayed]

FINDINGS: The heart size remains borderline enlarged. Normal pulmonary
vascularity. No focal consolidation, pleural effusion, or
pneumothorax. No acute osseous abnormality.
IMPRESSION: No active cardiopulmonary disease.

## 2019-03-21 ENCOUNTER — Telehealth: Payer: Self-pay

## 2019-03-21 NOTE — Telephone Encounter (Signed)
Attempted to speak wit Brad [name] this morning and no answer, voicemail box is full. Will attempt calls throughout the day.

## 2019-03-23 ENCOUNTER — Emergency Department (HOSPITAL_COMMUNITY)
Admission: EM | Admit: 2019-03-23 | Discharge: 2019-03-23 | Disposition: A | Discharge status: Home / Self Care | Payer: Medicaid Other | Attending: Emergency Medicine | Admitting: Emergency Medicine

## 2019-03-23 ENCOUNTER — Encounter (HOSPITAL_COMMUNITY): Payer: Self-pay | Admitting: Emergency Medicine

## 2019-03-23 ENCOUNTER — Emergency Department (HOSPITAL_COMMUNITY): Payer: Medicaid Other

## 2019-03-23 ENCOUNTER — Other Ambulatory Visit: Payer: Self-pay

## 2019-03-23 ENCOUNTER — Other Ambulatory Visit: Payer: Self-pay | Admitting: Family Medicine

## 2019-03-23 DIAGNOSIS — N183 Chronic kidney disease, stage 3 (moderate): Secondary | ICD-10-CM | POA: Insufficient documentation

## 2019-03-23 DIAGNOSIS — J181 Lobar pneumonia, unspecified organism: Secondary | ICD-10-CM | POA: Diagnosis not present

## 2019-03-23 DIAGNOSIS — I130 Hypertensive heart and chronic kidney disease with heart failure and stage 1 through stage 4 chronic kidney disease, or unspecified chronic kidney disease: Secondary | ICD-10-CM | POA: Insufficient documentation

## 2019-03-23 DIAGNOSIS — F17210 Nicotine dependence, cigarettes, uncomplicated: Secondary | ICD-10-CM | POA: Diagnosis not present

## 2019-03-23 DIAGNOSIS — R0602 Shortness of breath: Secondary | ICD-10-CM | POA: Diagnosis present

## 2019-03-23 DIAGNOSIS — E1122 Type 2 diabetes mellitus with diabetic chronic kidney disease: Secondary | ICD-10-CM | POA: Diagnosis not present

## 2019-03-23 DIAGNOSIS — I5042 Chronic combined systolic (congestive) and diastolic (congestive) heart failure: Secondary | ICD-10-CM | POA: Insufficient documentation

## 2019-03-23 DIAGNOSIS — I509 Heart failure, unspecified: Secondary | ICD-10-CM

## 2019-03-23 DIAGNOSIS — J189 Pneumonia, unspecified organism: Secondary | ICD-10-CM

## 2019-03-23 LAB — CBC
HCT: 38.9 % — ABNORMAL LOW (ref 39.0–52.0)
Hemoglobin: 13 g/dL (ref 13.0–17.0)
MCH: 29.5 pg (ref 26.0–34.0)
MCHC: 33.4 g/dL (ref 30.0–36.0)
MCV: 88.2 fL (ref 80.0–100.0)
Platelets: 196 10*3/uL (ref 150–400)
RBC: 4.41 MIL/uL (ref 4.22–5.81)
RDW: 15.1 % (ref 11.5–15.5)
WBC: 4.5 10*3/uL (ref 4.0–10.5)
nRBC: 0 % (ref 0.0–0.2)

## 2019-03-23 LAB — CBC WITH DIFFERENTIAL/PLATELET
Abs Immature Granulocytes: 0.01 10*3/uL (ref 0.00–0.07)
Basophils Absolute: 0.1 10*3/uL (ref 0.0–0.1)
Basophils Relative: 1 %
Eosinophils Absolute: 0 10*3/uL (ref 0.0–0.5)
Eosinophils Relative: 1 %
HCT: 38.6 % — ABNORMAL LOW (ref 39.0–52.0)
Hemoglobin: 13.1 g/dL (ref 13.0–17.0)
Immature Granulocytes: 0 %
Lymphocytes Relative: 23 %
Lymphs Abs: 1.1 10*3/uL (ref 0.7–4.0)
MCH: 29.8 pg (ref 26.0–34.0)
MCHC: 33.9 g/dL (ref 30.0–36.0)
MCV: 87.9 fL (ref 80.0–100.0)
Monocytes Absolute: 0.5 10*3/uL (ref 0.1–1.0)
Monocytes Relative: 10 %
Neutro Abs: 3.2 10*3/uL (ref 1.7–7.7)
Neutrophils Relative %: 65 %
Platelets: 203 10*3/uL (ref 150–400)
RBC: 4.39 MIL/uL (ref 4.22–5.81)
RDW: 15.2 % (ref 11.5–15.5)
WBC: 4.9 10*3/uL (ref 4.0–10.5)
nRBC: 0 % (ref 0.0–0.2)

## 2019-03-23 LAB — TROPONIN I
Troponin I: 0.04 ng/mL (ref <0.03)
Troponin I: 0.04 ng/mL (ref <0.03)

## 2019-03-23 LAB — BASIC METABOLIC PANEL
Anion gap: 10 (ref 5–15)
BUN: 12 mg/dL (ref 6–20)
CO2: 24 mmol/L (ref 22–32)
Calcium: 8.3 mg/dL — ABNORMAL LOW (ref 8.9–10.3)
Chloride: 96 mmol/L — ABNORMAL LOW (ref 98–111)
Creatinine, Ser: 1.19 mg/dL (ref 0.61–1.24)
GFR calc Af Amer: >60 mL/min (ref >60)
GFR calc non Af Amer: >60 mL/min (ref >60)
Glucose, Bld: 148 mg/dL — ABNORMAL HIGH (ref 70–99)
Potassium: 3.3 mmol/L — ABNORMAL LOW (ref 3.5–5.1)
Sodium: 130 mmol/L — ABNORMAL LOW (ref 135–145)

## 2019-03-23 LAB — BRAIN NATRIURETIC PEPTIDE: B Natriuretic Peptide: 1652.9 pg/mL — ABNORMAL HIGH (ref 0.0–100.0)

## 2019-03-23 MED ORDER — SODIUM CHLORIDE 0.9 % IV SOLN
1.0000 g | Freq: Once | INTRAVENOUS | Status: AC
  Administered 2019-03-23: 1 g via INTRAVENOUS
  Filled 2019-03-23: qty 10

## 2019-03-23 MED ORDER — DOXYCYCLINE HYCLATE 100 MG PO CAPS: 100.0000 mg | ORAL_CAPSULE | Freq: Two times a day (BID) | ORAL | 0 refills | Status: AC

## 2019-03-23 MED ORDER — DOXYCYCLINE HYCLATE 100 MG PO TABS
100.0000 mg | ORAL_TABLET | Freq: Once | ORAL | Status: AC
  Administered 2019-03-23: 100 mg via ORAL
  Filled 2019-03-23: qty 1

## 2019-03-23 MED ORDER — FUROSEMIDE 40 MG PO TABS: 40.0000 mg | ORAL_TABLET | Freq: Two times a day (BID) | ORAL | 0 refills | Status: DC

## 2019-03-23 MED ORDER — FUROSEMIDE 10 MG/ML IJ SOLN
40.0000 mg | Freq: Once | INTRAMUSCULAR | Status: AC
  Administered 2019-03-23: 40 mg via INTRAVENOUS
  Filled 2019-03-23: qty 4

## 2019-03-23 NOTE — Discharge Instructions (Signed)
Please take all of your antibiotics until finished!   You may develop abdominal discomfort or diarrhea from the antibiotic.  You may help offset this with probiotics which you can buy or get in yogurt. Do not eat  or take the probiotics until 2 hours after your antibiotic.   Restart your Lasix again.  It is important that you take this medicine to help your breathing and remove fluid from your lungs and legs.   Avoid drug use as this can worsen underlying heart problems.  Follow-up with your primary care physician as soon as possible for reevaluation of your symptoms.  Return to the emergency department if any concerning signs or symptoms develop such as fevers, persistent shortness of breath, chest pains, or passing out.

## 2019-03-23 NOTE — ED Triage Notes (Signed)
Pt BIB [facility] for SOB, Chest pain, and fever. EMS reports a dry cough for the past 2 days and a 5 lb increase with increased lower extremity edema. EMS reports pt is a heart failure clinic patient and noticed the 5 lb increase in his weight this morning. EMS reports the pt had a temperature of 100.1 upon their arrival at the house but when they got into the truck it was 97.7. EMS reports the pt is CAOx4, 12 lead was unremarkable, vital signs stable, and IV as noted. EMS reports 324 baby asa and (2) SL nitro tablet. EMS reports no change in pt's status while in route.   Pt reports the cough started 2 days ago along with the increased swelling. Pt reports over the 2 days he notices he was getting increasingly short of breath. Pt called the ambulance for assistance.
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
## 2019-03-23 NOTE — ED Notes (Signed)
Pt ambulated in hallway with pulse ox. O2 saturation stayed between 98-95% while ambulating. Respirations did increase to 35 and pt did complain of feeling light headed and dizzy. EDP notified.

## 2019-03-28 ENCOUNTER — Telehealth: Payer: Self-pay

## 2019-03-28 ENCOUNTER — Other Ambulatory Visit: Payer: Self-pay | Admitting: Family Medicine

## 2019-03-28 ENCOUNTER — Other Ambulatory Visit: Payer: Self-pay

## 2019-03-28 DIAGNOSIS — R0981 Nasal congestion: Secondary | ICD-10-CM
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
## 2019-03-28 NOTE — Progress Notes (Signed)
Paramedicine Encounter    Patient ID: Brad [name], male    DOB: 1961-08-24, 58 y.o.   MRN: [id number]   Patient Care Team: Charlott Rakes, MD as PCP - General (Family Medicine) Debara Pickett Nadean Corwin, MD as PCP - Cardiology (Cardiology) Charlott Rakes, MD (Family Medicine)  Patient Active Problem List   Diagnosis Date Noted  . Acute on chronic systolic (congestive) heart failure ([place]) 02/22/2019  . Acute on chronic systolic congestive heart failure ([place]) 02/21/2019  . Alcohol abuse 02/21/2019  . Frequent falls 01/17/2019  . Severe mitral regurgitation   . Fall 11/01/2018  . Hyponatremia 10/31/2018  . Abdominal distention   . Acute systolic congestive heart failure ([place])   . Chronic atrial fibrillation   . Chronic hyponatremia 08/16/2018  . NSVT (nonsustained ventricular tachycardia) ([place])   . Concussion with loss of consciousness   . Scalp laceration   . Trauma   . Permanent atrial fibrillation   . Hypertensive heart disease   . Shortness of breath   . ACS (acute coronary syndrome) ([place]) 07/25/2018  . Pyogenic inflammation of bone ([place])   . Ankle swelling, right 04/30/2018  . Cirrhosis ([place]) 03/23/2018  . Right ankle pain 03/23/2018  . Acute respiratory failure with hypoxia ([place]) 11/06/2017  . Syncope 08/07/2017  . Acute on chronic combined systolic and diastolic heart failure ([place]) 07/09/2017  . Atrial flutter ([place]) 07/09/2017  . Pre-syncope 07/08/2017  . Neuropathy 05/08/2017  . Substance induced mood disorder ([place]) 10/06/2016  . CVA (cerebral vascular accident) ([place]) 09/18/2016  . Left sided numbness   . Homelessness 08/21/2016  . S/P ORIF (open reduction internal fixation) fracture 08/01/2016  . CAD (coronary artery disease), native coronary artery 07/30/2016  . Surgery, elective   . Insomnia 07/22/2016  . Acute diastolic heart failure ([place])   . NSTEMI (non-ST elevated myocardial infarction) ([place])   . Normocytic anemia 07/05/2016  . Thrombocytopenia ([place])  07/05/2016  . AKI (acute kidney injury) ([place])   . Cocaine abuse ([place]) 07/02/2016  . Essential hypertension 07/01/2016  . Type 2 diabetes mellitus with complication, without long-term current use of insulin ([place]) 07/01/2016  . Hypokalemia 07/01/2016  . CKD (chronic kidney disease) stage 3, GFR 30-59 ml/min (HCC) 07/01/2016  . Painful diabetic neuropathy ([place]) 07/01/2016  . Polysubstance abuse ([place]) 05/27/2016  . Chronic hepatitis C with cirrhosis ([place]) 05/27/2016  . Chronic diastolic congestive heart failure ([place]) 01/31/2016  . Depression 04/21/2012  . GERD (gastroesophageal reflux disease) 02/16/2012  . History of drug abuse ([place])   . Heroin addiction ([place]) 01/29/2012    Current Outpatient Medications:  .  ACCU-CHEK FASTCLIX LANCETS MISC, Use as directed three times daily, Disp: 102 each, Rfl: 12 .  amitriptyline (ELAVIL) 75 MG tablet, Take 1 tablet (75 mg total) by mouth at bedtime., Disp: 30 tablet, Rfl: 6 .  amLODipine (NORVASC) 10 MG tablet, Take 10 mg by mouth daily., Disp: , Rfl:  .  atorvastatin (LIPITOR) 40 MG tablet, Take 1 tablet (40 mg total) by mouth daily., Disp: 90 tablet, Rfl: 3 .  Blood Glucose Monitoring Suppl (ACCU-CHEK GUIDE) w/Device KIT, 1 each by Does not apply route 3 (three) times daily., Disp: 1 kit, Rfl: 0 .  carvedilol (COREG) 3.125 MG tablet, TAKE 1 TABLET (3.125 MG TOTAL) BY MOUTH 2 (TWO) TIMES DAILY WITH A MEAL., Disp: 60 tablet, Rfl: 2 .  cetirizine (ZYRTEC) 10 MG tablet, TAKE 1 TABLET (10 MG TOTAL) BY MOUTH DAILY., Disp: 30 tablet, Rfl: 2 .  doxycycline (VIBRAMYCIN) 100  MG capsule, Take 1 capsule (100 mg total) by mouth 2 (two) times daily for 7 days., Disp: 14 capsule, Rfl: 0 .  EASY COMFORT PEN NEEDLES 31G X 8 MM MISC, USE AS DIRECTED TWICE A DAY, Disp: 100 each, Rfl: 5 .  furosemide (LASIX) 40 MG tablet, Take 1 tablet (40 mg total) by mouth 2 (two) times daily for 30 days., Disp: 60 tablet, Rfl: 0 .  gabapentin (NEURONTIN) 300 MG capsule, TAKE 2 CAPSULES  (600 MG TOTAL) BY MOUTH 2 (TWO) TIMES DAILY., Disp: 120 capsule, Rfl: 3 .  glucose blood (ACCU-CHEK GUIDE) test strip, Use as directed three times daily, Disp: 100 each, Rfl: 12 .  spironolactone (ALDACTONE) 25 MG tablet, Take 1 tablet (25 mg total) by mouth daily., Disp: 90 tablet, Rfl: 3 .  diclofenac sodium (VOLTAREN) 1 % GEL, Apply 4 g topically 4 (four) times daily as needed (to painful sites). (Patient not taking: Reported on 03/28/2019), Disp: 100 g, Rfl: 3 .  hydrocortisone 2.5 % cream, Apply 1 application topically 2 (two) times daily as needed (rash). , Disp: , Rfl: 5 .  Insulin Glargine (LANTUS SOLOSTAR) 100 UNIT/ML Solostar Pen, Inject 35 Units into the skin 2 (two) times daily. (Patient not taking: Reported on 03/07/2019), Disp: 30 mL, Rfl: 5 .  losartan (COZAAR) 25 MG tablet, Take 25 mg by mouth daily., Disp: , Rfl:  .  omeprazole (PRILOSEC) 20 MG capsule, Take 1 capsule (20 mg total) by mouth daily. (Patient not taking: Reported on 03/28/2019), Disp: 90 capsule, Rfl: 3 .  sildenafil (VIAGRA) 50 MG tablet, Take 1 tablet (50 mg total) by mouth daily as needed for erectile dysfunction. At least 24 hours between doses (Patient not taking: Reported on 03/07/2019), Disp: 10 tablet, Rfl: 0 Allergies  Allergen Reactions  . Angiotensin Receptor Blockers Anaphylaxis and Other (See Comments)    (Angioedema also with Lisinopril, therefore ARB's are contraindicated)  . Lisinopril Anaphylaxis and Other (See Comments)    Throat swells  . Pamelor [Nortriptyline Hcl] Anaphylaxis and Swelling    Throat swells     Social History   Socioeconomic History  . Marital status: Married    Spouse name: Not on file  . Number of children: 3  . Years of education: 2y college  . Highest education level: Not on file  Occupational History  . 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 Financial resource strain: Not on file  . Food insecurity:    Worry: Not on file     Inability: Not on file  . Transportation needs:    Medical: Not on file    Non-medical: Not on file  Tobacco Use  . Smoking status: Current Every Day Smoker    Packs/day: 0.50    Years: 28.00    Pack years: 14.00    Types: Cigarettes  . Smokeless tobacco: Never Used  Substance and Sexual Activity  . Alcohol use: Yes    Alcohol/week: 7.0 standard drinks    Types: 7 Cans of beer per week    Comment: "I drink ~ 40oz beer/day"  . Drug use: Yes    Types: IV, Cocaine, Heroin    Comment: 08/17/2018 "no heroin since 2013; I use cocaine ~ once/month, last use 03/21/2019  . Sexual activity: Not Currently  Lifestyle  . Physical activity:    Days per week: Not on file    Minutes per session: Not on file  . Stress:  Not on file  Relationships  . Social connections:    Talks on phone: Not on file    Gets together: Not on file    Attends religious service: Not on file    Active member of club or organization: Not on file    Attends meetings of clubs or organizations: Not on file    Relationship status: Not on file  . Intimate partner violence:    Fear of current or ex partner: Not on file    Emotionally abused: Not on file    Physically abused: Not on file    Forced sexual activity: Not on file  Other Topics Concern  . Not on file  Social History Narrative   ** Merged History Encounter **       Incarcerated from 2006-2010, then 10/2011-12/2011.  Has been trying to get sober (no heroin, alcohol since 10/2011).     Physical Exam Vitals signs reviewed.  HENT:     Head: Normocephalic.     Nose: No congestion or rhinorrhea.  Eyes:     Pupils: Pupils are equal, round, and reactive to light.  Neck:     Musculoskeletal: Normal range of motion.  Cardiovascular:     Rate and Rhythm: Normal rate and regular rhythm.  Pulmonary:     Effort: Pulmonary effort is normal. No respiratory distress.     Breath sounds: Normal breath sounds. No stridor.  Abdominal:     General: Abdomen is  flat. There is no distension.     Palpations: Abdomen is soft.     Tenderness: There is no abdominal tenderness. There is no guarding.  Musculoskeletal: Normal range of motion.     Right lower leg: Edema present.     Left lower leg: Edema present.  Skin:    General: Skin is warm and dry.     Capillary Refill: Capillary refill takes less than 2 seconds.  Neurological:     Mental Status: He is alert. Mental status is at baseline.  Psychiatric:        Mood and Affect: Mood normal.    Home visit completed with Gwyndolyn Saxon today at his friends house [employer]). Deyon was alert and oriented and did not appear to be intoxicated or under the influence of any drugs. Arrow was not in respiratory distress and denied having shortness of breath while sitting, but states he does get "winded" while walking longer distances. Jailon has been without his primary medications except for Lasix for 1 and a half weeks. Gergory states he is currently "homeless" living with friends and not really knowing his next step or plan for the future. Myself and [name] (care coordinator) have continued to seek housing but currently are having issues with finding housing due to COVID-19. No shelters are accepting new clients. Ronal was explained this and he understood. I picked up medications for Gwyndolyn Saxon at [facility]. Medications were reviewed. Chen states he lost his pill box but would be able to manage taking them from the pill bottles until I come back next week for visit to fill a box for him. Vitals assessed and are as noted. Alwin denied any priority symptoms, fever or cough but states his legs are more swollen than normal. He was educated on the importance of taking his prescribed medications daily and to continue eating healthy and not drinking alcohol or using drugs. Darek understood. Adriano was advised we would update him on housing if any became available. Home visit complete and scheduled for one  week out.     CBG: 149 Weight: not obtained.   No future appointments.   ACTION: Home visit completed Next visit planned for One Week

## 2019-03-29 NOTE — Telephone Encounter (Signed)
Call placed to [name], EMT and informed her that Dr Margarita Rana is recommending that the patient follow up with cardiology for CHF management.  He has not been compliant with keeping his appointments recently.  Nira Conn stated that she would contact the HF clinic to inquire if they are scheduling office visits/ telemedicine visits and then discuss options with patient.

## 2019-03-29 NOTE — Telephone Encounter (Signed)
Running out of Lasix will cause significant pedal edema?  At his last visit I had emphasized the need to keep his appointment with cardiology and he needs to do so for management of his CHF.

## 2019-04-04 ENCOUNTER — Telehealth: Payer: Self-pay

## 2019-04-04 NOTE — Telephone Encounter (Signed)
Call received from patient stating that he is doing better, no problems reported. He said that he has been taking his medication and the swelling has decreased in his legs.  Informed him of the importance of following up with cardiology and that Nira Conn, EMT will contact cardiology when she is with him and will schedule an appointment for him. He said that he has not been using drugs/cocaine.  He noted that he is living with a friend but he is not on the lease and it is public housing. He also reported that he is off probation but his probation officer will remain in touch with him. He is expecting a visit from [place] later this week. He also reported that he is waiting for his last installation of disability back pay that is due. This CM stressed with him the importance of establishing a payee to help manage his money as he tends to spend unnecessarily and people have also taken advantage of him when they know that he has money.  He said that DSS is not allowing people to come to the office in person.  He also noted that he will discuss the payee issue with his sister as she may be a potential payee who can assist him.  Call placed to DSS to inquire about process for establishing a payee as the patient does not have a reliable phone and DSS is not permitting clients to enter their offices. Spoke to [place] who said that the patient has a team of caseworkers.,  She sent message to the team requesting call back to this CM. Call received from Ms Mathis Fare who who referred this CM to [facility] # [phone] for information

## 2019-04-04 NOTE — Telephone Encounter (Signed)
As per [name], EMT, she needs to speak with the patient before cardiology will schedule an appointment for him.

## 2019-04-05 ENCOUNTER — Telehealth: Payer: Self-pay

## 2019-04-05 NOTE — Telephone Encounter (Signed)
Call placed to [facility] # [phone] for information about establishing a payee. Message left requesting a call back to this CM # [phone]

## 2019-04-06 ENCOUNTER — Telehealth: Payer: Self-pay

## 2019-04-06 NOTE — Telephone Encounter (Signed)
Brad [name], no answer. Message left for a return of call.

## 2019-04-07 ENCOUNTER — Telehealth: Payer: Self-pay

## 2019-04-07 NOTE — Telephone Encounter (Signed)
Attempted contact to Gwyndolyn Saxon, no answer. Will attempt again next week.

## 2019-04-11 ENCOUNTER — Telehealth: Payer: Self-pay

## 2019-04-11 NOTE — Telephone Encounter (Signed)
Left voicemail for Mr. Lust to return my call to schedule home visit.

## 2019-04-12 ENCOUNTER — Telehealth: Payer: Self-pay

## 2019-04-12 NOTE — Telephone Encounter (Signed)
Brad [name] called me to make me aware that he was doing okay, he had all of his medications and he is feeling good. He states currently he is staying with a friend and is trying to decide to move back with his other friend Brad [name] or to move in with his cousin who just moved here recently. Patient was given advice to be sure he is moving into a safe living environment and somewhere financially stable. Brad [name] agreed with same and agreed for a visit later this week. I explained I would be in contact with him.

## 2019-04-15 ENCOUNTER — Other Ambulatory Visit: Payer: Self-pay

## 2019-04-15 ENCOUNTER — Telehealth: Payer: Self-pay

## 2019-04-15 NOTE — Progress Notes (Signed)
Paramedicine Encounter    Patient ID: Brad [name], male    DOB: 16-Oct-1961, 58 y.o.   MRN: [id number]   Patient Care Team: Charlott Rakes, MD as PCP - General (Family Medicine) Debara Pickett Nadean Corwin, MD as PCP - Cardiology (Cardiology) Charlott Rakes, MD (Family Medicine)  Patient Active Problem List   Diagnosis Date Noted  . Acute on chronic systolic (congestive) heart failure ([place]) 02/22/2019  . Acute on chronic systolic congestive heart failure ([place]) 02/21/2019  . Alcohol abuse 02/21/2019  . Frequent falls 01/17/2019  . Severe mitral regurgitation   . Fall 11/01/2018  . Hyponatremia 10/31/2018  . Abdominal distention   . Acute systolic congestive heart failure ([place])   . Chronic atrial fibrillation   . Chronic hyponatremia 08/16/2018  . NSVT (nonsustained ventricular tachycardia) ([place])   . Concussion with loss of consciousness   . Scalp laceration   . Trauma   . Permanent atrial fibrillation   . Hypertensive heart disease   . Shortness of breath   . ACS (acute coronary syndrome) ([place]) 07/25/2018  . Pyogenic inflammation of bone ([place])   . Ankle swelling, right 04/30/2018  . Cirrhosis ([place]) 03/23/2018  . Right ankle pain 03/23/2018  . Acute respiratory failure with hypoxia ([place]) 11/06/2017  . Syncope 08/07/2017  . Acute on chronic combined systolic and diastolic heart failure ([place]) 07/09/2017  . Atrial flutter ([place]) 07/09/2017  . Pre-syncope 07/08/2017  . Neuropathy 05/08/2017  . Substance induced mood disorder ([place]) 10/06/2016  . CVA (cerebral vascular accident) ([place]) 09/18/2016  . Left sided numbness   . Homelessness 08/21/2016  . S/P ORIF (open reduction internal fixation) fracture 08/01/2016  . CAD (coronary artery disease), native coronary artery 07/30/2016  . Surgery, elective   . Insomnia 07/22/2016  . Acute diastolic heart failure ([place])   . NSTEMI (non-ST elevated myocardial infarction) ([place])   . Normocytic anemia 07/05/2016  . Thrombocytopenia ([place])  07/05/2016  . AKI (acute kidney injury) ([place])   . Cocaine abuse ([place]) 07/02/2016  . Essential hypertension 07/01/2016  . Type 2 diabetes mellitus with complication, without long-term current use of insulin ([place]) 07/01/2016  . Hypokalemia 07/01/2016  . CKD (chronic kidney disease) stage 3, GFR 30-59 ml/min (HCC) 07/01/2016  . Painful diabetic neuropathy ([place]) 07/01/2016  . Polysubstance abuse ([place]) 05/27/2016  . Chronic hepatitis C with cirrhosis ([place]) 05/27/2016  . Chronic diastolic congestive heart failure ([place]) 01/31/2016  . Depression 04/21/2012  . GERD (gastroesophageal reflux disease) 02/16/2012  . History of drug abuse ([place])   . Heroin addiction ([place]) 01/29/2012    Current Outpatient Medications:  .  ACCU-CHEK FASTCLIX LANCETS MISC, Use as directed three times daily, Disp: 102 each, Rfl: 12 .  amitriptyline (ELAVIL) 75 MG tablet, Take 1 tablet (75 mg total) by mouth at bedtime., Disp: 30 tablet, Rfl: 6 .  amLODipine (NORVASC) 10 MG tablet, Take 10 mg by mouth daily., Disp: , Rfl:  .  atorvastatin (LIPITOR) 40 MG tablet, Take 1 tablet (40 mg total) by mouth daily., Disp: 90 tablet, Rfl: 3 .  Blood Glucose Monitoring Suppl (ACCU-CHEK GUIDE) w/Device KIT, 1 each by Does not apply route 3 (three) times daily., Disp: 1 kit, Rfl: 0 .  carvedilol (COREG) 3.125 MG tablet, TAKE 1 TABLET (3.125 MG TOTAL) BY MOUTH 2 (TWO) TIMES DAILY WITH A MEAL., Disp: 60 tablet, Rfl: 2 .  cetirizine (ZYRTEC) 10 MG tablet, TAKE 1 TABLET (10 MG TOTAL) BY MOUTH DAILY., Disp: 30 tablet, Rfl: 2 .  diclofenac sodium (VOLTAREN)  1 % GEL, Apply 4 g topically 4 (four) times daily as needed (to painful sites). (Patient not taking: Reported on 03/28/2019), Disp: 100 g, Rfl: 3 .  EASY COMFORT PEN NEEDLES 31G X 8 MM MISC, USE AS DIRECTED TWICE A DAY, Disp: 100 each, Rfl: 5 .  furosemide (LASIX) 40 MG tablet, Take 1 tablet (40 mg total) by mouth 2 (two) times daily for 30 days., Disp: 60 tablet, Rfl: 0 .  gabapentin  (NEURONTIN) 300 MG capsule, TAKE 2 CAPSULES (600 MG TOTAL) BY MOUTH 2 (TWO) TIMES DAILY., Disp: 120 capsule, Rfl: 3 .  glucose blood (ACCU-CHEK GUIDE) test strip, Use as directed three times daily, Disp: 100 each, Rfl: 12 .  hydrocortisone 2.5 % cream, Apply 1 application topically 2 (two) times daily as needed (rash). , Disp: , Rfl: 5 .  Insulin Glargine (LANTUS SOLOSTAR) 100 UNIT/ML Solostar Pen, Inject 35 Units into the skin 2 (two) times daily. (Patient not taking: Reported on 03/07/2019), Disp: 30 mL, Rfl: 5 .  losartan (COZAAR) 25 MG tablet, Take 25 mg by mouth daily., Disp: , Rfl:  .  omeprazole (PRILOSEC) 20 MG capsule, Take 1 capsule (20 mg total) by mouth daily. (Patient not taking: Reported on 03/28/2019), Disp: 90 capsule, Rfl: 3 .  sildenafil (VIAGRA) 50 MG tablet, Take 1 tablet (50 mg total) by mouth daily as needed for erectile dysfunction. At least 24 hours between doses (Patient not taking: Reported on 03/07/2019), Disp: 10 tablet, Rfl: 0 .  spironolactone (ALDACTONE) 25 MG tablet, TAKE 1 TABLET (25 MG TOTAL) BY MOUTH DAILY., Disp: 30 tablet, Rfl: 1 Allergies  Allergen Reactions  . Angiotensin Receptor Blockers Anaphylaxis and Other (See Comments)    (Angioedema also with Lisinopril, therefore ARB's are contraindicated)  . Lisinopril Anaphylaxis and Other (See Comments)    Throat swells  . Pamelor [Nortriptyline Hcl] Anaphylaxis and Swelling    Throat swells     Social History   Socioeconomic History  . Marital status: Married    Spouse name: Not on file  . Number of children: 3  . Years of education: 2y college  . Highest education level: Not on file  Occupational History  . 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 Financial resource strain: Not on file  . Food insecurity:    Worry: Not on file    Inability: Not on file  . Transportation needs:    Medical: Not on file    Non-medical: Not on file  Tobacco Use  . Smoking  status: Current Every Day Smoker    Packs/day: 0.50    Years: 28.00    Pack years: 14.00    Types: Cigarettes  . Smokeless tobacco: Never Used  Substance and Sexual Activity  . Alcohol use: Yes    Alcohol/week: 7.0 standard drinks    Types: 7 Cans of beer per week    Comment: "I drink ~ 40oz beer/day"  . Drug use: Yes    Types: IV, Cocaine, Heroin    Comment: 08/17/2018 "no heroin since 2013; I use cocaine ~ once/month, last use 03/21/2019  . Sexual activity: Not Currently  Lifestyle  . Physical activity:    Days per week: Not on file    Minutes per session: Not on file  . Stress: Not on file  Relationships  . Social connections:    Talks on phone: Not on file    Gets together: Not on file  Attends religious service: Not on file    Active member of club or organization: Not on file    Attends meetings of clubs or organizations: Not on file    Relationship status: Not on file  . Intimate partner violence:    Fear of current or ex partner: Not on file    Emotionally abused: Not on file    Physically abused: Not on file    Forced sexual activity: Not on file  Other Topics Concern  . Not on file  Social History Narrative   ** Merged History Encounter **       Incarcerated from 2006-2010, then 10/2011-12/2011.  Has been trying to get sober (no heroin, alcohol since 10/2011).     Physical Exam Vitals signs reviewed.  HENT:     Head: Normocephalic.     Nose: Congestion present. No rhinorrhea.     Mouth/Throat:     Mouth: Mucous membranes are dry.     Pharynx: Oropharynx is clear.  Eyes:     Pupils: Pupils are equal, round, and reactive to light.  Neck:     Musculoskeletal: Normal range of motion.  Cardiovascular:     Rate and Rhythm: Normal rate and regular rhythm.     Pulses: Normal pulses.     Heart sounds: Normal heart sounds.  Pulmonary:     Effort: Pulmonary effort is normal.     Breath sounds: Normal breath sounds.  Abdominal:     General: Abdomen is  flat.     Palpations: Abdomen is soft.  Musculoskeletal:        General: Swelling and tenderness present.     Right lower leg: Edema present.     Left lower leg: Edema present.     Comments: Bilateral leg pain, right leg redness, tenderness and swelling, warm to touch.   Skin:    General: Skin is warm and dry.     Capillary Refill: Capillary refill takes less than 2 seconds.  Neurological:     Mental Status: He is alert. Mental status is at baseline.  Psychiatric:        Mood and Affect: Mood normal.      Arrived to see Mr. Nees today. He was alert and oriented seated in an outside chair at his friend Bonn[place] home in which he has been staying at recently. Mr. Batson stated he was having leg pain with swelling. Upon exam bilateral leg swelling with right leg being more swollen than the left as well as it being red, tender and hot to the touch. Mr. Nevel stated it has been more difficult to walk over the last 5 days. Mr. Tooker denies shortness of breath, coughing, dizziness, fever, chills or body aches. Patient's baseline vitals were obtained and are as noted. Patient admitted to drinking beer daily. He also admits his diet has not been ideal. Mr. Busch was advised it would be in his best interest to be further evaluated in the Emergency Room due to leg swelling, pain, tenderness and heat radiating from site due to possible infection or DVT. Patient adamantly refused same and was encouraged that he should be further evaluated by a physician. Hakeem continued to refuse. Mr. Bartolomei states he has been taking his daily medications as prescribed. Patient was assured that I will be contacting the doctors office for further for possible appointment scheduling. Home visit complete. Patient agreed to future home visit.      No future appointments.   ACTION: Home visit completed  Next visit planned for one week.

## 2019-04-15 NOTE — Telephone Encounter (Signed)
call received from [name], EMT stating that she saw patient today.  She reported that both of his legs are edematous, the right leg more edematous than the left leg.  She also stated that he is complaining of pain in both legs to his thighs. She  explained that there is an area redness of right lower extremity near ankle that is also warm to touch.  T:97.6; P: 86, BP: 90/60. O2 sat: 98%. He has been taking his medications as ordered; but has not yet followed up with cardiology.    She explained to him the risk of possible complications related to the edema and redness and the need to have his legs evaluated in Urgent Care or ED but he was adamant that he was not going.   [name] then called patient while this CM was on the phone.  This CM explained to him  about need to have legs assessed in effort to prevent possible complications and hospitalization and stressed the need to be seen in Urgent Care/ED.  He said that he did not want to go to Urgent Care because it cost $3.00.   He did state that he would go to the ED when his friend got home and could drive him there.

## 2019-04-15 NOTE — Telephone Encounter (Signed)
Brad [name] answered the phone and spoke to Brad [name] and I and he agreed to go to the Hospital for further evaluation. [name] states he will call me with follow up.

## 2019-04-16 ENCOUNTER — Emergency Department (HOSPITAL_COMMUNITY)
Admission: EM | Admit: 2019-04-16 | Discharge: 2019-04-16 | Disposition: A | Discharge status: Home / Self Care | Payer: Medicaid Other | Attending: Emergency Medicine | Admitting: Emergency Medicine

## 2019-04-16 ENCOUNTER — Other Ambulatory Visit: Payer: Self-pay

## 2019-04-16 ENCOUNTER — Emergency Department (HOSPITAL_COMMUNITY): Payer: Medicaid Other

## 2019-04-16 ENCOUNTER — Encounter (HOSPITAL_COMMUNITY): Payer: Self-pay | Admitting: *Deleted

## 2019-04-16 DIAGNOSIS — I5032 Chronic diastolic (congestive) heart failure: Secondary | ICD-10-CM | POA: Diagnosis not present

## 2019-04-16 DIAGNOSIS — Z794 Long term (current) use of insulin: Secondary | ICD-10-CM | POA: Diagnosis not present

## 2019-04-16 DIAGNOSIS — F17210 Nicotine dependence, cigarettes, uncomplicated: Secondary | ICD-10-CM | POA: Diagnosis not present

## 2019-04-16 DIAGNOSIS — E1122 Type 2 diabetes mellitus with diabetic chronic kidney disease: Secondary | ICD-10-CM | POA: Diagnosis not present

## 2019-04-16 DIAGNOSIS — I130 Hypertensive heart and chronic kidney disease with heart failure and stage 1 through stage 4 chronic kidney disease, or unspecified chronic kidney disease: Secondary | ICD-10-CM | POA: Diagnosis not present

## 2019-04-16 DIAGNOSIS — Z8546 Personal history of malignant neoplasm of prostate: Secondary | ICD-10-CM | POA: Insufficient documentation

## 2019-04-16 DIAGNOSIS — F1490 Cocaine use, unspecified, uncomplicated: Secondary | ICD-10-CM | POA: Insufficient documentation

## 2019-04-16 DIAGNOSIS — I509 Heart failure, unspecified: Secondary | ICD-10-CM

## 2019-04-16 DIAGNOSIS — N183 Chronic kidney disease, stage 3 (moderate): Secondary | ICD-10-CM | POA: Diagnosis not present

## 2019-04-16 DIAGNOSIS — I4891 Unspecified atrial fibrillation: Secondary | ICD-10-CM | POA: Insufficient documentation

## 2019-04-16 DIAGNOSIS — Z79899 Other long term (current) drug therapy: Secondary | ICD-10-CM | POA: Insufficient documentation

## 2019-04-16 DIAGNOSIS — R0602 Shortness of breath: Secondary | ICD-10-CM | POA: Diagnosis not present

## 2019-04-16 DIAGNOSIS — R609 Edema, unspecified: Secondary | ICD-10-CM | POA: Diagnosis present

## 2019-04-16 LAB — CBC WITH DIFFERENTIAL/PLATELET
Abs Immature Granulocytes: 0.01 10*3/uL (ref 0.00–0.07)
Basophils Absolute: 0 10*3/uL (ref 0.0–0.1)
Basophils Relative: 1 %
Eosinophils Absolute: 0.1 10*3/uL (ref 0.0–0.5)
Eosinophils Relative: 2 %
HCT: 38.9 % — ABNORMAL LOW (ref 39.0–52.0)
Hemoglobin: 13.4 g/dL (ref 13.0–17.0)
Immature Granulocytes: 0 %
Lymphocytes Relative: 23 %
Lymphs Abs: 1.1 10*3/uL (ref 0.7–4.0)
MCH: 30.5 pg (ref 26.0–34.0)
MCHC: 34.4 g/dL (ref 30.0–36.0)
MCV: 88.4 fL (ref 80.0–100.0)
Monocytes Absolute: 0.5 10*3/uL (ref 0.1–1.0)
Monocytes Relative: 11 %
Neutro Abs: 3 10*3/uL (ref 1.7–7.7)
Neutrophils Relative %: 63 %
Platelets: 211 10*3/uL (ref 150–400)
RBC: 4.4 MIL/uL (ref 4.22–5.81)
RDW: 15.6 % — ABNORMAL HIGH (ref 11.5–15.5)
WBC: 4.8 10*3/uL (ref 4.0–10.5)
nRBC: 0 % (ref 0.0–0.2)

## 2019-04-16 LAB — COMPREHENSIVE METABOLIC PANEL
ALT: 24 U/L (ref 0–44)
AST: 27 U/L (ref 15–41)
Albumin: 2.5 g/dL — ABNORMAL LOW (ref 3.5–5.0)
Alkaline Phosphatase: 60 U/L (ref 38–126)
Anion gap: 11 (ref 5–15)
BUN: 30 mg/dL — ABNORMAL HIGH (ref 6–20)
CO2: 19 mmol/L — ABNORMAL LOW (ref 22–32)
Calcium: 8.3 mg/dL — ABNORMAL LOW (ref 8.9–10.3)
Chloride: 99 mmol/L (ref 98–111)
Creatinine, Ser: 1.59 mg/dL — ABNORMAL HIGH (ref 0.61–1.24)
GFR calc Af Amer: 54 mL/min — ABNORMAL LOW (ref >60)
GFR calc non Af Amer: 47 mL/min — ABNORMAL LOW (ref >60)
Glucose, Bld: 135 mg/dL — ABNORMAL HIGH (ref 70–99)
Potassium: 4 mmol/L (ref 3.5–5.1)
Sodium: 129 mmol/L — ABNORMAL LOW (ref 135–145)
Total Bilirubin: 0.4 mg/dL (ref 0.3–1.2)
Total Protein: 6.6 g/dL (ref 6.5–8.1)

## 2019-04-16 LAB — BRAIN NATRIURETIC PEPTIDE: B Natriuretic Peptide: 973.4 pg/mL — ABNORMAL HIGH (ref 0.0–100.0)

## 2019-04-16 MED ORDER — FUROSEMIDE 10 MG/ML IJ SOLN
40.0000 mg | Freq: Once | INTRAMUSCULAR | Status: AC
  Administered 2019-04-16: 40 mg via INTRAVENOUS
  Filled 2019-04-16: qty 4
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
## 2019-04-16 NOTE — ED Notes (Signed)
Pt ambulated with steady gait.  SpO2 was above 96 at all times.

## 2019-04-16 NOTE — ED Notes (Signed)
Patient transported to X-ray 

## 2019-04-16 NOTE — Discharge Instructions (Signed)
It was my pleasure taking care of you today!   Please take 80mg  (two tablets) of your furosemide instead of 40mg  tomorrow.   It is VERY important that you call the cardiology clinic (I have listed Dr. Lysbeth Penner number for you). Call them first thing Monday morning to schedule an appointment.   Return to ER if you develop a fever, your breathing worsens, you have new symptoms or any additional concerns.

## 2019-04-16 NOTE — ED Triage Notes (Signed)
PT reports bil ankle swelling with a Hx of CHF

## 2019-04-16 NOTE — ED Triage Notes (Signed)
[facility]- pt here from home. Pt having bilateral ankle pain. Pt has hx of CHF. onlly has difficulty breathing when laying down. No CP or SOB. Pt has afib and HR is irregular.    CBG 143 98.2 temp

## 2019-04-18 ENCOUNTER — Telehealth: Payer: Self-pay

## 2019-04-18 DIAGNOSIS — I482 Chronic atrial fibrillation, unspecified: Secondary | ICD-10-CM

## 2019-04-18 NOTE — Telephone Encounter (Signed)
Spoke to [place] he states his legs are still swollen but [place] discharged him. He reports taking his medications as prescribed but still not feeling his best. I let him know I am working on getting him an appointment with Cardiology and will let him know once that is scheduled. He did agree to go to visits. I expressed the importance of making sure he is present for any and all appointments made. He understood and agreed. Will be following up soon with same.   -Message sent to HF clinic about same waiting to hear back from [place].

## 2019-04-18 NOTE — Telephone Encounter (Signed)
Attempted to call Brad [name], no answer. Will continue to reach out to Brad [name].

## 2019-04-18 NOTE — Telephone Encounter (Signed)
Call received from [name], EMT noting that she has not been able to reach the patient this morning but plans to assist him with scheduling an appointment with cardiology.

## 2019-04-18 NOTE — Telephone Encounter (Signed)
Call received from [name], EMT stating that she spoke to cardiology regarding scheduling an appointment for patient.  She was told that due to patient missing multiple appointments/ no shows a new referral is needed.  Informed her that Dr Margarita Rana would be notified.   Nira Conn said that she will assist with insuring that patient is able to keep this appointment .

## 2019-04-18 NOTE — Telephone Encounter (Signed)
He needs urgent care or ED evaluation

## 2019-04-19 ENCOUNTER — Telehealth: Payer: Self-pay

## 2019-04-19 NOTE — Telephone Encounter (Signed)
Referral placed.

## 2019-04-19 NOTE — Telephone Encounter (Signed)
Message received from [name], EMT requesting this CM call patient as he needs to leave his residence.   Call placed to patient.  He explained that he is not supposed to be staying at the house where he has been and he needs to leave.  He hopes that he can stay at least until tomorrow.  He was asking for resources and has not made any attempts to locate housing.  He has no family to live with at this time. He said that he spoke to his sister about becoming a payee for him but the paperwork still needs to be completed and he is waiting for a call back for the social security administration.  Informed him that this CM would attempt to find a housing resource for him.  He was appreciative of any assistance that can be provided.   Call placed to [employer] [facility] # [phone], message left for Neta Mends requesting call back to this CM.   Call received from Mr Sandi Mealy. This CM explained the patient's need for housing and the ongoing issues he has with managing money, searching for housing, ongoing homelessness and the need to leave where he is currently staying. Stable housing is essential for him to be able to being to effectively manage his medical conditions.  Mr Sandi Mealy stated that he would have a counselor reach out to the patient today. This noted that the patient needs to be called today at # [phone] as this is not his phone. It is the phone of one of the people he is staying with and he may need to leave tonight.  Mr Sandi Mealy said that they will attempt to assess the patient's current situation and history to determine how best to help him.  This CM to call patient and notify him that he will be contacted today.   Call placed to # [phone], the patient's friend answered and said that the patient had gone to the store and should be back shortly.  Instructed him to have patient return the call to this CM as soon as possible.   Attempted to contact patient again at 1704 after no  call back for over 40 minutes.  - no answer and the message stated that the voicemail is not set up. Unable to leave a message.  This CM left a voicemail message at patient's old phone # [phone] requesting a call back as soon as possible. Marland Kitchen Update provided to [name], EMT.   1726 - Attempted to contact patient again   - no answer and the message stated that the voicemail is not set up. Unable to leave a message.

## 2019-04-19 NOTE — Telephone Encounter (Signed)
Message sent to [name], EMT notifying her that Dr Margarita Rana placed the referral to cardiology

## 2019-04-20 ENCOUNTER — Other Ambulatory Visit: Payer: Self-pay

## 2019-04-20 ENCOUNTER — Telehealth: Payer: Self-pay

## 2019-04-20 ENCOUNTER — Ambulatory Visit: Payer: Medicaid Other | Attending: Family Medicine | Admitting: Family Medicine

## 2019-04-20 NOTE — Telephone Encounter (Signed)
Call received from patient. He said that tonight he needs to leave the house where he has been staying as he is not supposed to be there.  He is not sure where he is going. He is going to try to have his phone turned on tonight as the phone that he has been using belongs to the person he is currently staying with.  He also said that he went to [facility] today and they do not have any shelter beds and he does not know of any beds available in the city.  Informed him that this CM spoke to representative from [employer] [facility] and they were to call him yesterday. This CM unable to reach him ( patient) yesterday afternoon.  The patient said that he received a call from a woman, he was not sure where she was from and she told him that they ran out of funds.  He was not sure what she was talking about.   This CM instructed him to call this CM back tomorrow to discuss other possible options for housing.

## 2019-04-21 ENCOUNTER — Telehealth: Payer: Self-pay

## 2019-04-21 NOTE — Telephone Encounter (Signed)
Call placed to patient and informed him that this CM is waiting to hear back from Ms [name].   He said that he is staying at the [facility]  - $50/night.   He does not want to be there but realizes that he can't stay outside another night. He said that he has all of hs medications and does not need any refills  until Monday - 04/25/2019.  He noted that he went to the [facility] today and they were not letting anyone it.

## 2019-04-21 NOTE — Telephone Encounter (Signed)
Message received from [facility], [facility], stating that she spoke to patient and requested a call back from this CM . # [phone]. Option 3.  Call returned to [facility], message left with CM call back #.

## 2019-04-21 NOTE — Telephone Encounter (Signed)
Call received from patient.  He said that he left the house where he was staying. He slept at a bus stop last night and a friend picked him up this morning but he is not able to stay with that friend.  He said that he received his disability money this morning and he was able to turn his phone on  # [phone].   Informed him that this CM would contact [facility] again to check on options for shelter. This CM also encouraged him to go to the [facility] today.    Call placed to [employer] [facility] # [phone], message left for Neta Mends requesting a call back to this CM

## 2019-04-22 ENCOUNTER — Telehealth: Payer: Self-pay

## 2019-04-22 NOTE — Telephone Encounter (Signed)
Call placed to Epimenio Sarin, [facility].  She stated that she spoke to patient the other day. She will reach out to him this afternoon to do to a homeless  risk assessment. There is no housing guarantee at this time but it is the start of a process for re-housing/    She also explained that shelters are not accepting any new clients. There is no funding available for motels , the housing authority is shut down.   This CM explained that he does not have family /friends that he can currently stay with. He is at the [facility] and his income is limited and he will not be able to stay there indefinitely.  He will need assistance with establishing a payee to help him manage his money. She said that her records show that he left [facility] in 05/2018 to go to housing. This CM explained that he has not been in his own apartment/ housing. He has always been with someone who has allowed him to stay there temporarily.  Some have asked him for money.  He has missed appointments at [facility].  Provided her with his current phone number.  Attempted to contact patient to inform him of above - message left requesting call back to this CM #[phone].

## 2019-04-25 ENCOUNTER — Telehealth: Payer: Self-pay

## 2019-04-25 NOTE — Telephone Encounter (Signed)
Call placed to patient with Brad [name], EMT. Patient stated that he is no longer as [employer] , he is now at [employer] at is costing $67/day. He said that he did not hear from [employer].  Informed him that this CM could contact her.   Call placed to Ms [name].  She said that she tried to call patient twice, and left message for him but no call back. She said that he could call her back at # [phone] option 3.   Call placed to patient and instructed him to call Ms [name]. He requested that the number be text to him   Number text to patient as requested. Update provided to [employer].

## 2019-04-25 NOTE — Telephone Encounter (Signed)
Spoke to [place] who states he is currently staying at the [facility] [address]. Andie states he is taking his medications but his legs are still swollen but reports them "looking better than they did." I told Byran I would call him later for home visit appointment.

## 2019-04-25 NOTE — Telephone Encounter (Signed)
Spoke to Brad [name] who stated he had not yet contacted [facility]. I texted him the number for same and advised him to contact them and call me back with a update.   As of 1615 no return of call from [place].

## 2019-04-26 ENCOUNTER — Telehealth: Payer: Self-pay | Admitting: Family Medicine

## 2019-04-26 NOTE — Telephone Encounter (Signed)
New Message  Pt requesting a call back

## 2019-04-27 NOTE — Telephone Encounter (Signed)
Call returned to patient, message requesting a call back to this CM # [phone]

## 2019-04-28 ENCOUNTER — Telehealth (HOSPITAL_COMMUNITY): Payer: Self-pay

## 2019-04-28 NOTE — Telephone Encounter (Signed)
Spoke to [place] who agreed to visit on Monday at 3pm. Will be having televisit with HF clinic. He agreed to same.

## 2019-05-02 ENCOUNTER — Ambulatory Visit (HOSPITAL_COMMUNITY)
Admission: RE | Admit: 2019-05-02 | Discharge: 2019-05-02 | Disposition: A | Discharge status: Home / Self Care | Payer: Medicaid Other | Source: Ambulatory Visit | Attending: Adult Health | Admitting: Adult Health

## 2019-05-02 ENCOUNTER — Encounter (HOSPITAL_COMMUNITY): Payer: Self-pay

## 2019-05-02 ENCOUNTER — Telehealth: Payer: Self-pay

## 2019-05-02 ENCOUNTER — Other Ambulatory Visit (HOSPITAL_COMMUNITY): Payer: Self-pay

## 2019-05-02 ENCOUNTER — Other Ambulatory Visit: Payer: Self-pay

## 2019-05-02 DIAGNOSIS — I5022 Chronic systolic (congestive) heart failure: Secondary | ICD-10-CM

## 2019-05-02 DIAGNOSIS — I5032 Chronic diastolic (congestive) heart failure: Secondary | ICD-10-CM

## 2019-05-02 MED ORDER — POTASSIUM CHLORIDE ER 20 MEQ PO TBCR: 20.0000 meq | EXTENDED_RELEASE_TABLET | Freq: Every day | ORAL | 0 refills | Status: DC

## 2019-05-02 MED ORDER — APIXABAN 5 MG PO TABS: 5.0000 mg | ORAL_TABLET | Freq: Two times a day (BID) | ORAL | 2 refills | Status: DC

## 2019-05-02 MED ORDER — TORSEMIDE 20 MG PO TABS: ORAL_TABLET | ORAL | 2 refills | Status: DC
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
## 2019-05-02 NOTE — Telephone Encounter (Signed)
Call received from  [name], EMT stating that she spoke to [facility] when she was with the patient today and he completed the homeless risk assessment. She said that the patient qualified for high risk re-housing and hopes to have some information for him by the end of this week.

## 2019-05-02 NOTE — Patient Instructions (Signed)
1. Stop Lasix.  2. Start torsemide 60 qam/40 qpm. Add KCl 20 daily.  3. Restart apixaban 5 mg bid.  4. Followup in office next week, will need BMET at that time.
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
## 2019-05-03 IMAGING — CR DG ANKLE COMPLETE 3+V*R*
3 series · 3 of 3 positions shown · non-contrast
Comparison: 10/06/2016

CLINICAL DATA: Worsening pain and swelling to the right ankle.

EXAM:
RIGHT ANKLE - COMPLETE 3+ VIEW

[x ankle ap right]
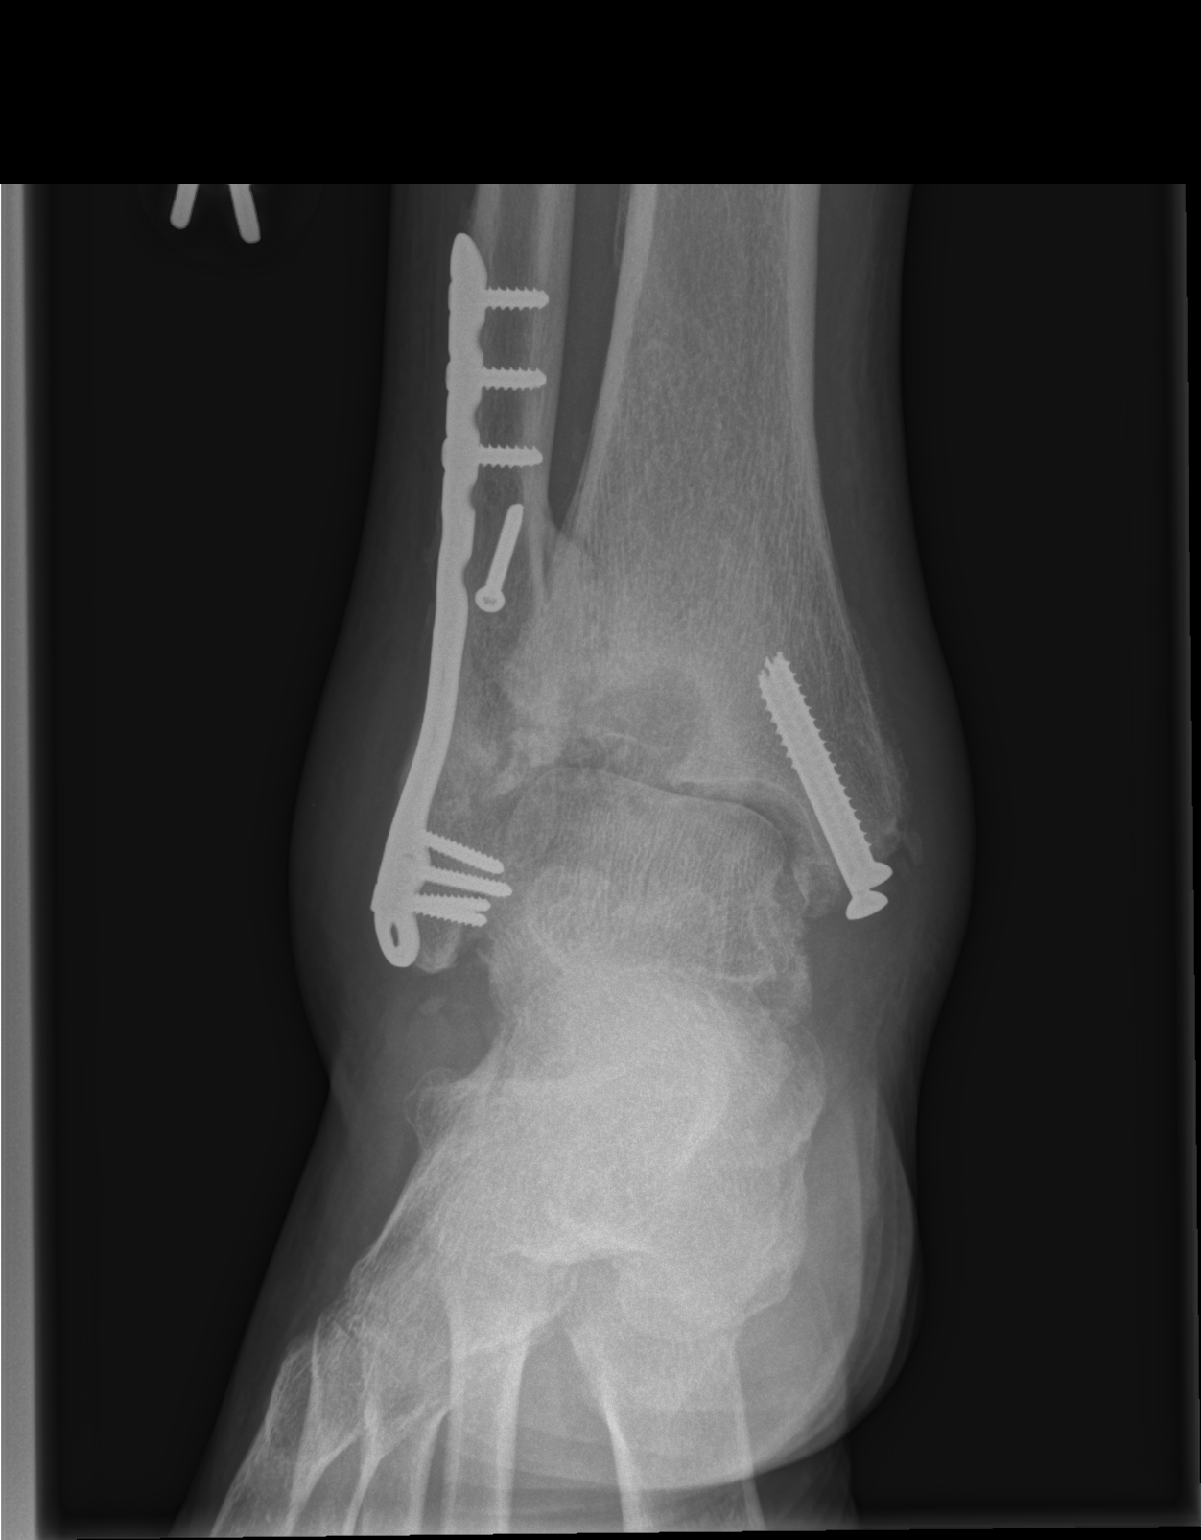

[x ankle obl right]
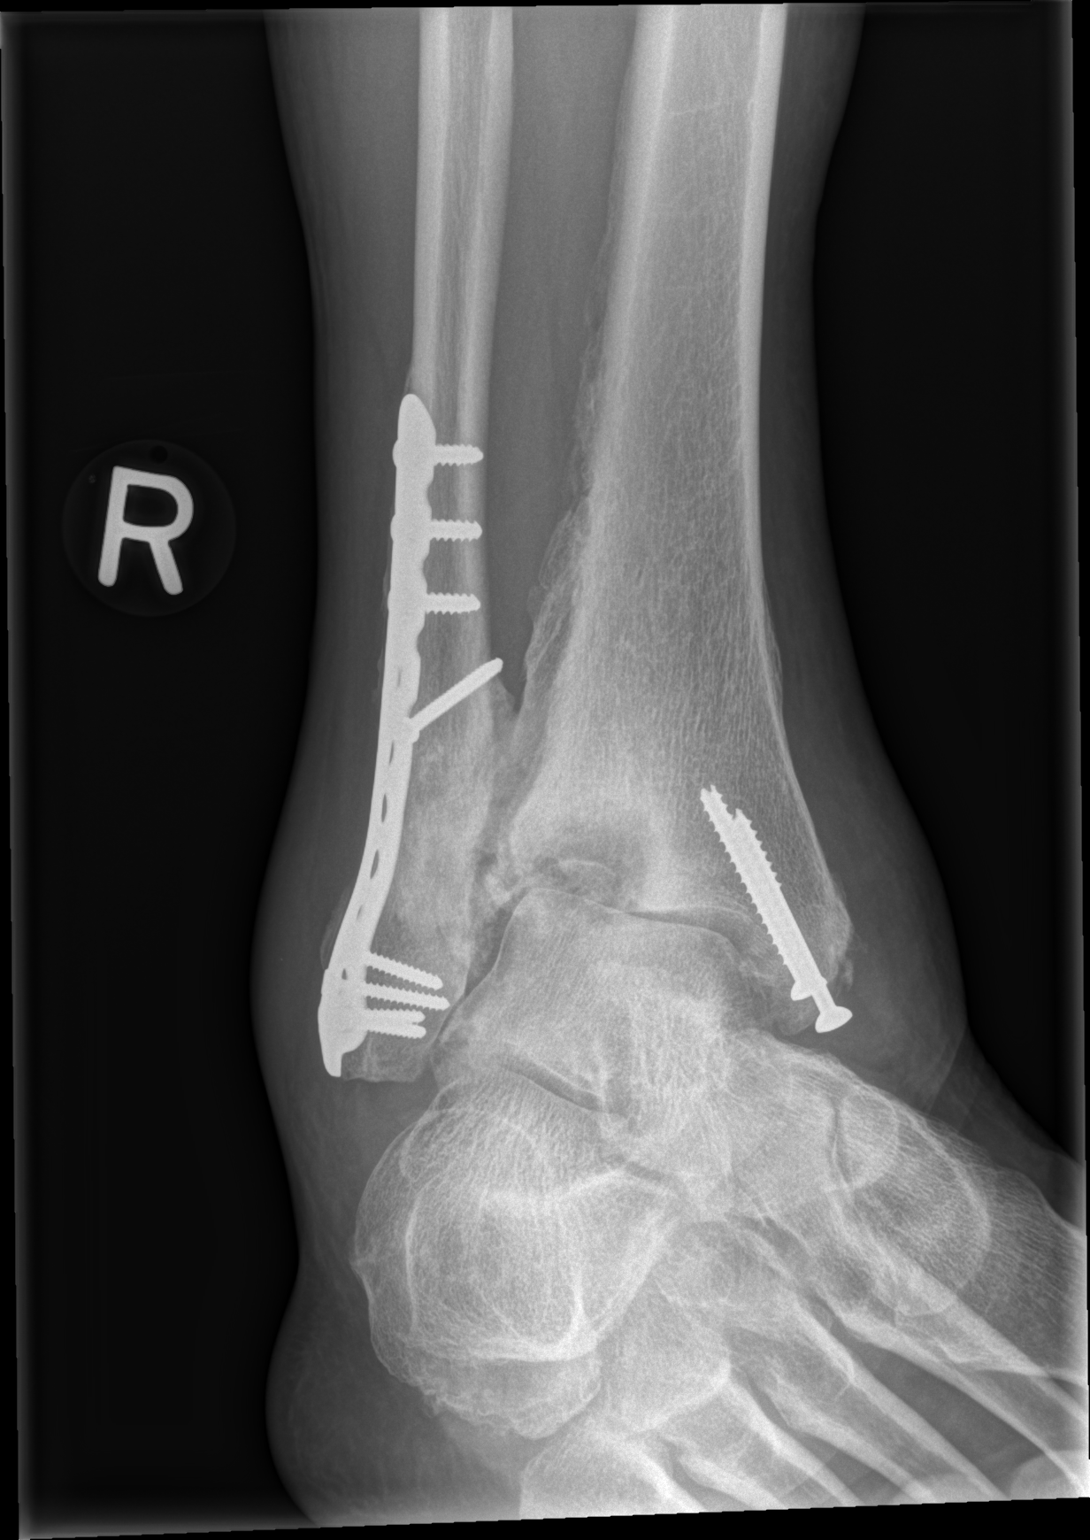

[x ankle lat right]
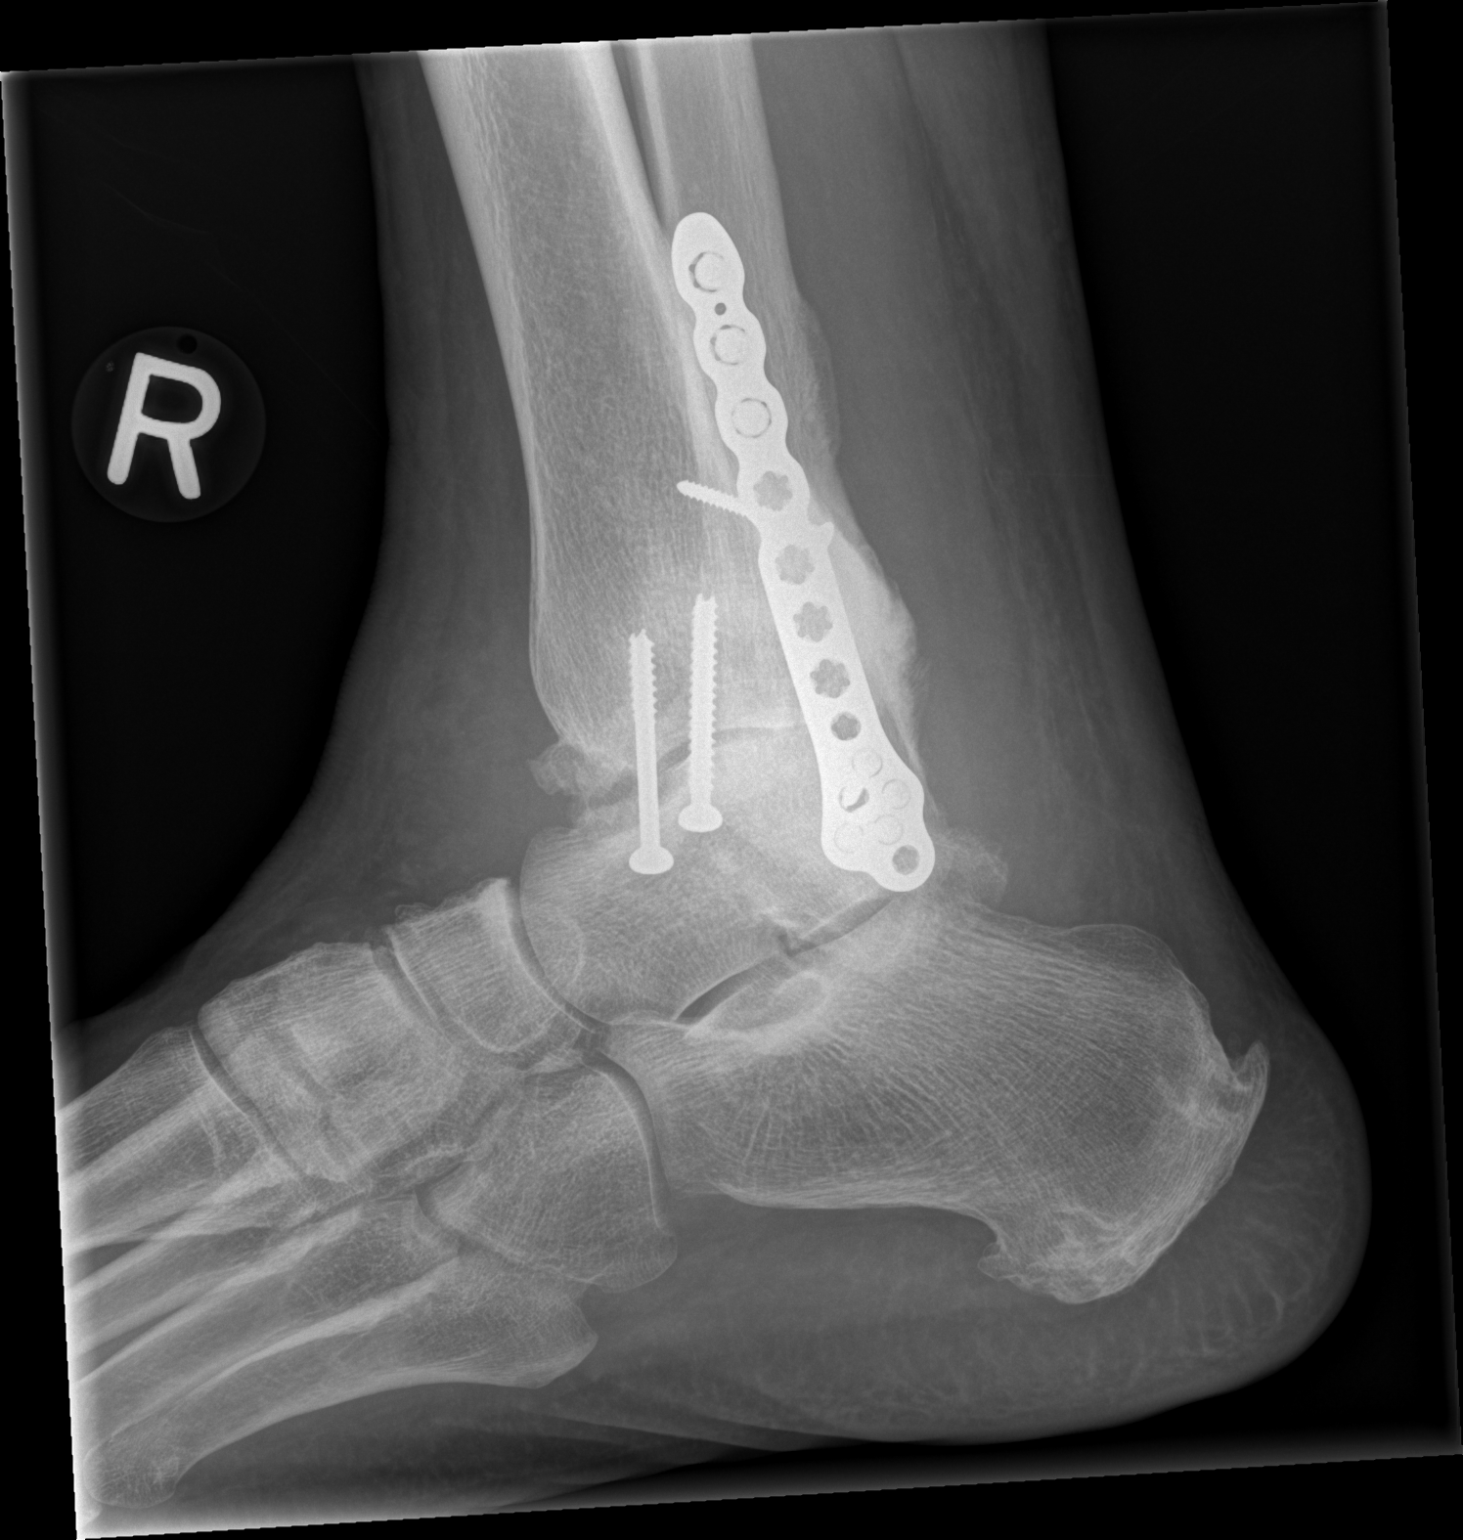

[3 of 3 positions shown; findings below may reference images not displayed]

FINDINGS: Prior side plate and screw fixation of distal fibula. Prior
cancellous screw fixation of the medial malleolus. Subchondral area
of osteolysis with thick sclerotic rim seen within the tibial
plafond laterally. Similarly area of osteolysis with surrounding
sclerotic changes seen at the prior fracture line within the distal
fibula. Reactive periosteal reaction along the distal tibia. Ankle
mortise is disrupted. Diffuse soft tissue swelling about the ankle.
IMPRESSION: Findings suspicious for osteomyelitis of the right distal tibia, and
distal fibula.

Disruption of ankle mortise.

Diffuse soft tissue swelling about the right ankle.

These results were called by telephone at the time of interpretation
on 04/30/2018 at [DATE] to Dr. FLOURINCE JOOJO , who verbally
acknowledged these results.

## 2019-05-09 ENCOUNTER — Telehealth (HOSPITAL_COMMUNITY): Payer: Self-pay

## 2019-05-09 ENCOUNTER — Telehealth: Payer: Self-pay

## 2019-05-09 ENCOUNTER — Other Ambulatory Visit (HOSPITAL_COMMUNITY): Payer: Self-pay

## 2019-05-09 ENCOUNTER — Other Ambulatory Visit: Payer: Self-pay | Admitting: Family Medicine
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
## 2019-05-09 NOTE — Telephone Encounter (Signed)
Contacted patient reminding him of appointment tomorrow and to bring his medications with him to appointment.

## 2019-05-09 NOTE — Telephone Encounter (Signed)
Left voicemail for Brad [name] to return my call in reference to home visit appointment.

## 2019-05-10 ENCOUNTER — Ambulatory Visit (HOSPITAL_COMMUNITY)
Admission: RE | Admit: 2019-05-10 | Discharge: 2019-05-10 | Disposition: A | Discharge status: Home / Self Care | Payer: Medicaid Other | Source: Ambulatory Visit | Attending: Cardiology | Admitting: Cardiology

## 2019-05-10 ENCOUNTER — Telehealth: Payer: Self-pay

## 2019-05-10 ENCOUNTER — Other Ambulatory Visit: Payer: Self-pay

## 2019-05-10 VITALS — BP 124/78 | HR 84 | Wt 157.1 lb

## 2019-05-10 DIAGNOSIS — I447 Left bundle-branch block, unspecified: Secondary | ICD-10-CM | POA: Insufficient documentation

## 2019-05-10 DIAGNOSIS — F17210 Nicotine dependence, cigarettes, uncomplicated: Secondary | ICD-10-CM | POA: Diagnosis not present

## 2019-05-10 DIAGNOSIS — E1122 Type 2 diabetes mellitus with diabetic chronic kidney disease: Secondary | ICD-10-CM | POA: Diagnosis not present

## 2019-05-10 DIAGNOSIS — I130 Hypertensive heart and chronic kidney disease with heart failure and stage 1 through stage 4 chronic kidney disease, or unspecified chronic kidney disease: Secondary | ICD-10-CM | POA: Insufficient documentation

## 2019-05-10 DIAGNOSIS — Z809 Family history of malignant neoplasm, unspecified: Secondary | ICD-10-CM | POA: Diagnosis not present

## 2019-05-10 DIAGNOSIS — I4892 Unspecified atrial flutter: Secondary | ICD-10-CM | POA: Diagnosis not present

## 2019-05-10 DIAGNOSIS — F1010 Alcohol abuse, uncomplicated: Secondary | ICD-10-CM | POA: Insufficient documentation

## 2019-05-10 DIAGNOSIS — N183 Chronic kidney disease, stage 3 unspecified: Secondary | ICD-10-CM

## 2019-05-10 DIAGNOSIS — F329 Major depressive disorder, single episode, unspecified: Secondary | ICD-10-CM | POA: Diagnosis not present

## 2019-05-10 DIAGNOSIS — R9431 Abnormal electrocardiogram [ECG] [EKG]: Secondary | ICD-10-CM | POA: Diagnosis not present

## 2019-05-10 DIAGNOSIS — I491 Atrial premature depolarization: Secondary | ICD-10-CM | POA: Diagnosis not present

## 2019-05-10 DIAGNOSIS — K7460 Unspecified cirrhosis of liver: Secondary | ICD-10-CM | POA: Diagnosis not present

## 2019-05-10 DIAGNOSIS — Z7901 Long term (current) use of anticoagulants: Secondary | ICD-10-CM | POA: Insufficient documentation

## 2019-05-10 DIAGNOSIS — Z888 Allergy status to other drugs, medicaments and biological substances status: Secondary | ICD-10-CM | POA: Insufficient documentation

## 2019-05-10 DIAGNOSIS — I5032 Chronic diastolic (congestive) heart failure: Secondary | ICD-10-CM

## 2019-05-10 DIAGNOSIS — I5022 Chronic systolic (congestive) heart failure: Secondary | ICD-10-CM | POA: Diagnosis not present

## 2019-05-10 DIAGNOSIS — B1920 Unspecified viral hepatitis C without hepatic coma: Secondary | ICD-10-CM | POA: Insufficient documentation

## 2019-05-10 DIAGNOSIS — Z8249 Family history of ischemic heart disease and other diseases of the circulatory system: Secondary | ICD-10-CM | POA: Insufficient documentation

## 2019-05-10 DIAGNOSIS — E785 Hyperlipidemia, unspecified: Secondary | ICD-10-CM | POA: Insufficient documentation

## 2019-05-10 DIAGNOSIS — Z79899 Other long term (current) drug therapy: Secondary | ICD-10-CM | POA: Diagnosis not present

## 2019-05-10 DIAGNOSIS — Z8546 Personal history of malignant neoplasm of prostate: Secondary | ICD-10-CM | POA: Insufficient documentation

## 2019-05-10 LAB — BASIC METABOLIC PANEL
Anion gap: 14 (ref 5–15)
BUN: 17 mg/dL (ref 6–20)
CO2: 21 mmol/L — ABNORMAL LOW (ref 22–32)
Calcium: 9.1 mg/dL (ref 8.9–10.3)
Chloride: 98 mmol/L (ref 98–111)
Creatinine, Ser: 1.67 mg/dL — ABNORMAL HIGH (ref 0.61–1.24)
GFR calc Af Amer: 51 mL/min — ABNORMAL LOW (ref >60)
GFR calc non Af Amer: 44 mL/min — ABNORMAL LOW (ref >60)
Glucose, Bld: 144 mg/dL — ABNORMAL HIGH (ref 70–99)
Potassium: 4.7 mmol/L (ref 3.5–5.1)
Sodium: 133 mmol/L — ABNORMAL LOW (ref 135–145)

## 2019-05-10 LAB — CBC
HCT: 48.3 % (ref 39.0–52.0)
Hemoglobin: 16.4 g/dL (ref 13.0–17.0)
MCH: 30.3 pg (ref 26.0–34.0)
MCHC: 34 g/dL (ref 30.0–36.0)
MCV: 89.3 fL (ref 80.0–100.0)
Platelets: 241 10*3/uL (ref 150–400)
RBC: 5.41 MIL/uL (ref 4.22–5.81)
RDW: 15 % (ref 11.5–15.5)
WBC: 4.5 10*3/uL (ref 4.0–10.5)
nRBC: 0 % (ref 0.0–0.2)

## 2019-05-10 MED ORDER — HYDRALAZINE HCL 25 MG PO TABS: 37.5000 mg | ORAL_TABLET | Freq: Three times a day (TID) | ORAL | 3 refills | Status: DC

## 2019-05-10 MED ORDER — ISOSORBIDE MONONITRATE ER 30 MG PO TB24: 30.0000 mg | ORAL_TABLET | Freq: Every day | ORAL | 3 refills | Status: DC

## 2019-05-10 NOTE — Progress Notes (Signed)
Spoke w/Cimberly Stoffel, community paramedic, she is aware of med changes and will assist pt with making sure he takes correctly

## 2019-05-10 NOTE — Patient Instructions (Signed)
Stop Amlodipine  Start Hydralazine 37.5 mg (1 & 1/2 tabs) Three times a day   Start Imdur 30 mg daily  Labs done today  Your physician recommends that you schedule a follow-up appointment in: 1 month
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
## 2019-05-10 NOTE — Telephone Encounter (Signed)
Met with the patient when he was at his cardiology appointment today and completed the COVID 19 screening tool for homeless shelters and service providers. The program/isolation order  was explained to patient in detail, including consequences of non compliance with rules, and he signed the isolation order.    Call placed to [facility] # [phone] and message was left for Ms Reece Packer requesting a call back to this CM # [phone] for guidance regarding next steps and where to send completed documents. .   Informed the patient that the [facility] would contact him with instructions. There is no timetable for when they might call him.  He has phone and Games developer. He has mask on and can use at home. This CM give him the original signed Isolation Order to take with him.  He was completing his cardiology appointment when this CM left,

## 2019-05-11 ENCOUNTER — Telehealth: Payer: Self-pay

## 2019-05-11 NOTE — Telephone Encounter (Signed)
Call placed to [facility] Department regarding next steps for COVID screening/shelter process. # [phone].  Spoke to [employer] who requested that the referral be emailed to her  -  [email or web address].  SECURE email sent with referral to above noted address.

## 2019-05-11 NOTE — Addendum Note (Signed)
Encounter addended by: Scarlette Calico, RN on: 05/11/2019 4:01 PM  Actions taken: Charge Capture section accepted

## 2019-05-12 ENCOUNTER — Telehealth: Payer: Self-pay

## 2019-05-12 NOTE — Telephone Encounter (Signed)
Call received from patient stating that he was speaking with someone from the health department yesterday and his phone went dead.  He was not sure what number the person called from and was not able to look up the number on his phone.    Attempted to contact Ms Reece Packer [facility] # [phone], message left requesting a call back to this CM # [phone]

## 2019-05-12 NOTE — Telephone Encounter (Signed)
Call received from patient stating that the health department called him and he is being picked up this morning for COVID testing by [facility] and will then be taken to [facility] as part [place] screening for the homeless.  The patient stated that he is anxious about the process but understands that he needs to follow the screening process and adhere to the rules to participate in the program with plans to be transferred to a shelter.  Reminded him to take his medications and phone/charge with him as well as other personal belongings.  He stated that he understood.  Update proved to [name], EMT who stated that the patient called and already updated her.

## 2019-05-12 NOTE — Telephone Encounter (Signed)
Call placed to patient this afternoon. He said that he had the COVID screening done and is now at [facility].

## 2019-05-16 ENCOUNTER — Telehealth (HOSPITAL_COMMUNITY): Payer: Self-pay

## 2019-05-16 ENCOUNTER — Other Ambulatory Visit (HOSPITAL_COMMUNITY): Payer: Self-pay

## 2019-05-16 NOTE — Telephone Encounter (Signed)
Left message for Takota to contact me for scheduling appointment today.

## 2019-05-16 NOTE — Progress Notes (Signed)
Paramedicine Encounter    Patient ID: Brad [name], male    DOB: 13-May-1961, 58 y.o.   MRN: [id number]   Patient Care Team: Charlott Rakes, MD as PCP - General (Family Medicine) Debara Pickett Nadean Corwin, MD as PCP - Cardiology (Cardiology) Charlott Rakes, MD (Family Medicine)  Patient Active Problem List   Diagnosis Date Noted  . Acute on chronic systolic (congestive) heart failure (Nord) 02/22/2019  . Acute on chronic systolic congestive heart failure (Boyd) 02/21/2019  . Alcohol abuse 02/21/2019  . Frequent falls 01/17/2019  . Severe mitral regurgitation   . Fall 11/01/2018  . Hyponatremia 10/31/2018  . Abdominal distention   . Acute systolic congestive heart failure ([place])   . Chronic atrial fibrillation   . Chronic hyponatremia 08/16/2018  . NSVT (nonsustained ventricular tachycardia) ([place]erg[place])   . Concussion with loss of consciousness   . Scalp laceration   . Trauma   . Permanent atrial fibrillation   . Hypertensive heart disease   . Shortness of breath   . ACS (acute coronary syndrome) (Ragsdale) 07/25/2018  . Pyogenic inflammation of bone (Bowie)   . Ankle swelling, right 04/30/2018  . Cirrhosis ([place]) 03/23/2018  . Right ankle pain 03/23/2018  . Acute respiratory failure with hypoxia ([place]rafton) 11/06/2017  . Syncope 08/07/2017  . Acute on chronic combined systolic and diastolic heart failure ([place]) 07/09/2017  . Atrial flutter ([place]ristow) 07/09/2017  . Pre-syncope 07/08/2017  . Neuropathy 05/08/2017  . Substance induced mood disorder (Olmito) 10/06/2016  . CVA (cerebral vascular accident) (Zalma) 09/18/2016  . Left sided numbness   . Homelessness 08/21/2016  . S/P ORIF (open reduction internal fixation) fracture 08/01/2016  . CAD (coronary artery disease), native coronary artery 07/30/2016  . Surgery, elective   . Insomnia 07/22/2016  . Acute diastolic heart failure ([place]ushnell)   . NSTEMI (non-ST elevated myocardial infarction) ([place])   . Normocytic anemia 07/05/2016  . Thrombocytopenia ([place]art)  07/05/2016  . AKI (acute kidney injury) ([place])   . Cocaine abuse ([place]) 07/02/2016  . Essential hypertension 07/01/2016  . Type 2 diabetes mellitus with complication, without long-term current use of insulin ([place]) 07/01/2016  . Hypokalemia 07/01/2016  . CKD (chronic kidney disease) stage 3, GFR 30-59 ml/min (HCC) 07/01/2016  . Painful diabetic neuropathy ([place]ler) 07/01/2016  . Polysubstance abuse (Hinsdale) 05/27/2016  . Chronic hepatitis C with cirrhosis (Ar[place]) 05/27/2016  . Chronic diastolic congestive heart failure ([place]) 01/31/2016  . Depression 04/21/2012  . GERD (gastroesophageal reflux disease) 02/16/2012  . History of drug abuse ([place]risonville)   . Heroin addiction ([place]rum[place]) 01/29/2012    Current Outpatient Medications:  .  amitriptyline (ELAVIL) 75 MG tablet, Take 1 tablet (75 mg total) by mouth at bedtime. Needs appt for more refills., Disp: 30 tablet, Rfl: 0 .  apixaban (ELIQUIS) 5 MG TABS tablet, Take 1 tablet (5 mg total) by mouth 2 (two) times daily., Disp: 60 tablet, Rfl: 2 .  atorvastatin (LIPITOR) 40 MG tablet, Take 1 tablet (40 mg total) by mouth daily., Disp: 90 tablet, Rfl: 3 .  carvedilol (COREG) 3.125 MG tablet, TAKE 1 TABLET (3.125 MG TOTAL) BY MOUTH 2 (TWO) TIMES DAILY WITH A MEAL., Disp: 60 tablet, Rfl: 2 .  cetirizine (ZYRTEC) 10 MG tablet, TAKE 1 TABLET (10 MG TOTAL) BY MOUTH DAILY., Disp: 30 tablet, Rfl: 2 .  gabapentin (NEURONTIN) 300 MG capsule, TAKE 2 CAPSULES (600 MG TOTAL) BY MOUTH 2 (TWO) TIMES DAILY., Disp: 120 capsule, Rfl: 3 .  hydrALAZINE (APRESOLINE) 25 MG tablet, Take 1.5 tablets (37.5  mg total) by mouth 3 (three) times daily., Disp: 135 tablet, Rfl: 3 .  isosorbide mononitrate (IMDUR) 30 MG 24 hr tablet, Take 1 tablet (30 mg total) by mouth daily., Disp: 30 tablet, Rfl: 3 .  losartan (COZAAR) 25 MG tablet, Take 25 mg by mouth daily., Disp: , Rfl:  .  potassium chloride 20 MEQ TBCR, Take 20 mEq by mouth daily., Disp: 90 tablet, Rfl: 0 .  spironolactone (ALDACTONE) 25 MG  tablet, TAKE 1 TABLET (25 MG TOTAL) BY MOUTH DAILY., Disp: 30 tablet, Rfl: 1 .  torsemide (DEMADEX) 20 MG tablet, Take 3 tablets (60 mg total) by mouth every morning AND 2 tablets (40 mg total) at bedtime., Disp: 150 tablet, Rfl: 2 .  ACCU-CHEK FASTCLIX LANCETS MISC, Use as directed three times daily (Patient not taking: Reported on 05/16/2019), Disp: 102 each, Rfl: 12 .  Blood Glucose Monitoring Suppl (ACCU-CHEK GUIDE) w/Device KIT, 1 each by Does not apply route 3 (three) times daily. (Patient not taking: Reported on 05/16/2019), Disp: 1 kit, Rfl: 0 .  EASY COMFORT PEN NEEDLES 31G X 8 MM MISC, USE AS DIRECTED TWICE A DAY (Patient not taking: Reported on 05/16/2019), Disp: 100 each, Rfl: 5 .  glucose blood (ACCU-CHEK GUIDE) test strip, Use as directed three times daily (Patient not taking: Reported on 05/16/2019), Disp: 100 each, Rfl: 12 .  hydrocortisone 2.5 % cream, Apply 1 application topically 2 (two) times daily as needed (rash). , Disp: , Rfl: 5 .  sildenafil (VIAGRA) 50 MG tablet, Take 1 tablet (50 mg total) by mouth daily as needed for erectile dysfunction. At least 24 hours between doses (Patient not taking: Reported on 05/16/2019), Disp: 10 tablet, Rfl: 0 Allergies  Allergen Reactions  . Angiotensin Receptor Blockers Anaphylaxis and Other (See Comments)    (Angioedema also with Lisinopril, therefore ARB's are contraindicated)  . Lisinopril Anaphylaxis and Other (See Comments)    Throat swells  . Pamelor [Nortriptyline Hcl] Anaphylaxis and Swelling    Throat swells     Social History   Socioeconomic History  . Marital status: Married    Spouse name: Not on file  . Number of children: 3  . Years of education: 2y college  . Highest education level: Not on file  Occupational History  . 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 Financial resource strain: Not on file  . Food insecurity:    Worry: Not on file    Inability: Not on file  .  Transportation needs:    Medical: Not on file    Non-medical: Not on file  Tobacco Use  . Smoking status: Current Every Day Smoker    Packs/day: 0.50    Years: 28.00    Pack years: 14.00    Types: Cigarettes  . Smokeless tobacco: Never Used  Substance and Sexual Activity  . Alcohol use: Yes    Alcohol/week: 7.0 standard drinks    Types: 7 Cans of beer per week    Comment: "I drink ~ 40oz beer/day"  . Drug use: Yes    Types: IV, Cocaine, Heroin    Comment: 08/17/2018 "no heroin since 2013; I use cocaine ~ once/month, last use 03/21/2019  . Sexual activity: Not Currently  Lifestyle  . Physical activity:    Days per week: Not on file    Minutes per session: Not on file  . Stress: Not on file  Relationships  . Social connections:  Talks on phone: Not on file    Gets together: Not on file    Attends religious service: Not on file    Active member of club or organization: Not on file    Attends meetings of clubs or organizations: Not on file    Relationship status: Not on file  . Intimate partner violence:    Fear of current or ex partner: Not on file    Emotionally abused: Not on file    Physically abused: Not on file    Forced sexual activity: Not on file  Other Topics Concern  . Not on file  Social History Narrative   ** Merged History Encounter **       Incarcerated from 2006-2010, then 10/2011-12/2011.  Has been trying to get sober (no heroin, alcohol since 10/2011).     Physical Exam Vitals signs reviewed.  HENT:     Head: Normocephalic.     Nose: Nose normal.     Mouth/Throat:     Mouth: Mucous membranes are moist.  Eyes:     Pupils: Pupils are equal, round, and reactive to light.  Neck:     Musculoskeletal: Normal range of motion.  Cardiovascular:     Rate and Rhythm: Normal rate and regular rhythm.     Pulses: Normal pulses.     Heart sounds: Normal heart sounds.  Pulmonary:     Effort: Pulmonary effort is normal.     Breath sounds: Normal breath  sounds.  Abdominal:     General: Abdomen is flat.     Palpations: Abdomen is soft.  Musculoskeletal: Normal range of motion.  Skin:    General: Skin is warm and dry.     Capillary Refill: Capillary refill takes less than 2 seconds.  Neurological:     Mental Status: He is alert. Mental status is at baseline.  Psychiatric:        Mood and Affect: Mood normal.     Arrived on scene to find male patient sitting on the edge of his bed in his room at the [facility]. Brad [name] reports feeling find other than having some diarrhea with his new medication regimen. Patient states he has been taking his medications. Vitals were obtained and are as recorded. Medications were reviewed and verified. Pills were placed in pill box. Patient had no other complaints. Weight appeared same, no edema noted.  Visit complete, patient agreed to next week's visit.   CBG- 168  (will be calling to schedule PCP visit this week)    Future Appointments  Date Time Provider [place]  06/08/2019  2:30 PM [facility] PA/NP [facility] None     ACTION: Home visit completed Next visit planned for one week

## 2019-05-17 ENCOUNTER — Telehealth: Payer: Self-pay

## 2019-05-17 NOTE — Telephone Encounter (Signed)
Call received from [facility]. She stated that patient's COVID test was negative. He was discharged from the hotel and arrangements were made for him to go to [facility].  She stated that as per the health department, he went to a boarding house and [facility] was not aware that he was going to the boarding house.

## 2019-05-23 ENCOUNTER — Encounter (HOSPITAL_COMMUNITY): Payer: Self-pay | Admitting: Emergency Medicine

## 2019-05-23 ENCOUNTER — Telehealth (HOSPITAL_COMMUNITY): Payer: Self-pay

## 2019-05-23 ENCOUNTER — Inpatient Hospital Stay (HOSPITAL_COMMUNITY)
Admission: EM | Admit: 2019-05-23 | Discharge: 2019-05-28 | DRG: 291 | Disposition: A | Discharge status: Home / Self Care | Payer: Medicaid Other | Attending: Internal Medicine | Admitting: Internal Medicine

## 2019-05-23 ENCOUNTER — Emergency Department (HOSPITAL_COMMUNITY): Payer: Medicaid Other

## 2019-05-23 ENCOUNTER — Other Ambulatory Visit: Payer: Self-pay

## 2019-05-23 DIAGNOSIS — E1140 Type 2 diabetes mellitus with diabetic neuropathy, unspecified: Secondary | ICD-10-CM | POA: Diagnosis present

## 2019-05-23 DIAGNOSIS — I428 Other cardiomyopathies: Secondary | ICD-10-CM | POA: Diagnosis present

## 2019-05-23 DIAGNOSIS — I10 Essential (primary) hypertension: Secondary | ICD-10-CM | POA: Diagnosis present

## 2019-05-23 DIAGNOSIS — I130 Hypertensive heart and chronic kidney disease with heart failure and stage 1 through stage 4 chronic kidney disease, or unspecified chronic kidney disease: Principal | ICD-10-CM | POA: Diagnosis present

## 2019-05-23 DIAGNOSIS — K7030 Alcoholic cirrhosis of liver without ascites: Secondary | ICD-10-CM | POA: Diagnosis present

## 2019-05-23 DIAGNOSIS — F1010 Alcohol abuse, uncomplicated: Secondary | ICD-10-CM | POA: Diagnosis present

## 2019-05-23 DIAGNOSIS — F1911 Other psychoactive substance abuse, in remission: Secondary | ICD-10-CM | POA: Diagnosis present

## 2019-05-23 DIAGNOSIS — E119 Type 2 diabetes mellitus without complications: Secondary | ICD-10-CM | POA: Diagnosis present

## 2019-05-23 DIAGNOSIS — Z720 Tobacco use: Secondary | ICD-10-CM | POA: Diagnosis present

## 2019-05-23 DIAGNOSIS — Z7901 Long term (current) use of anticoagulants: Secondary | ICD-10-CM

## 2019-05-23 DIAGNOSIS — I4892 Unspecified atrial flutter: Secondary | ICD-10-CM | POA: Diagnosis not present

## 2019-05-23 DIAGNOSIS — N179 Acute kidney failure, unspecified: Secondary | ICD-10-CM | POA: Diagnosis present

## 2019-05-23 DIAGNOSIS — Z9119 Patient's noncompliance with other medical treatment and regimen: Secondary | ICD-10-CM

## 2019-05-23 DIAGNOSIS — R071 Chest pain on breathing: Secondary | ICD-10-CM

## 2019-05-23 DIAGNOSIS — N183 Chronic kidney disease, stage 3 unspecified: Secondary | ICD-10-CM | POA: Diagnosis present

## 2019-05-23 DIAGNOSIS — I5043 Acute on chronic combined systolic (congestive) and diastolic (congestive) heart failure: Secondary | ICD-10-CM | POA: Diagnosis not present

## 2019-05-23 DIAGNOSIS — Z9114 Patient's other noncompliance with medication regimen: Secondary | ICD-10-CM

## 2019-05-23 DIAGNOSIS — E7800 Pure hypercholesterolemia, unspecified: Secondary | ICD-10-CM | POA: Diagnosis present

## 2019-05-23 DIAGNOSIS — I440 Atrioventricular block, first degree: Secondary | ICD-10-CM | POA: Diagnosis present

## 2019-05-23 DIAGNOSIS — B182 Chronic viral hepatitis C: Secondary | ICD-10-CM | POA: Diagnosis not present

## 2019-05-23 DIAGNOSIS — E785 Hyperlipidemia, unspecified: Secondary | ICD-10-CM | POA: Diagnosis present

## 2019-05-23 DIAGNOSIS — K7460 Unspecified cirrhosis of liver: Secondary | ICD-10-CM | POA: Diagnosis present

## 2019-05-23 DIAGNOSIS — E1142 Type 2 diabetes mellitus with diabetic polyneuropathy: Secondary | ICD-10-CM | POA: Diagnosis present

## 2019-05-23 DIAGNOSIS — E871 Hypo-osmolality and hyponatremia: Secondary | ICD-10-CM | POA: Diagnosis present

## 2019-05-23 DIAGNOSIS — K921 Melena: Secondary | ICD-10-CM | POA: Diagnosis present

## 2019-05-23 DIAGNOSIS — F17210 Nicotine dependence, cigarettes, uncomplicated: Secondary | ICD-10-CM | POA: Diagnosis present

## 2019-05-23 DIAGNOSIS — E1122 Type 2 diabetes mellitus with diabetic chronic kidney disease: Secondary | ICD-10-CM | POA: Diagnosis present

## 2019-05-23 DIAGNOSIS — Z79899 Other long term (current) drug therapy: Secondary | ICD-10-CM

## 2019-05-23 DIAGNOSIS — Z96652 Presence of left artificial knee joint: Secondary | ICD-10-CM | POA: Diagnosis present

## 2019-05-23 DIAGNOSIS — I4821 Permanent atrial fibrillation: Secondary | ICD-10-CM | POA: Diagnosis present

## 2019-05-23 DIAGNOSIS — Z8673 Personal history of transient ischemic attack (TIA), and cerebral infarction without residual deficits: Secondary | ICD-10-CM

## 2019-05-23 DIAGNOSIS — F329 Major depressive disorder, single episode, unspecified: Secondary | ICD-10-CM | POA: Diagnosis present

## 2019-05-23 DIAGNOSIS — R778 Other specified abnormalities of plasma proteins: Secondary | ICD-10-CM | POA: Diagnosis present

## 2019-05-23 DIAGNOSIS — I2510 Atherosclerotic heart disease of native coronary artery without angina pectoris: Secondary | ICD-10-CM | POA: Diagnosis present

## 2019-05-23 DIAGNOSIS — I509 Heart failure, unspecified: Secondary | ICD-10-CM

## 2019-05-23 DIAGNOSIS — F1020 Alcohol dependence, uncomplicated: Secondary | ICD-10-CM | POA: Diagnosis present

## 2019-05-23 DIAGNOSIS — F1410 Cocaine abuse, uncomplicated: Secondary | ICD-10-CM | POA: Diagnosis present

## 2019-05-23 DIAGNOSIS — E118 Type 2 diabetes mellitus with unspecified complications: Secondary | ICD-10-CM

## 2019-05-23 DIAGNOSIS — R7989 Other specified abnormal findings of blood chemistry: Secondary | ICD-10-CM | POA: Diagnosis present

## 2019-05-23 DIAGNOSIS — Z1159 Encounter for screening for other viral diseases: Secondary | ICD-10-CM

## 2019-05-23 DIAGNOSIS — Z590 Homelessness: Secondary | ICD-10-CM

## 2019-05-23 DIAGNOSIS — K219 Gastro-esophageal reflux disease without esophagitis: Secondary | ICD-10-CM | POA: Diagnosis present

## 2019-05-23 LAB — COMPREHENSIVE METABOLIC PANEL
ALT: 27 U/L (ref 0–44)
AST: 32 U/L (ref 15–41)
Albumin: 3.6 g/dL (ref 3.5–5.0)
Alkaline Phosphatase: 81 U/L (ref 38–126)
Anion gap: 9 (ref 5–15)
BUN: 13 mg/dL (ref 6–20)
CO2: 21 mmol/L — ABNORMAL LOW (ref 22–32)
Calcium: 8.7 mg/dL — ABNORMAL LOW (ref 8.9–10.3)
Chloride: 97 mmol/L — ABNORMAL LOW (ref 98–111)
Creatinine, Ser: 0.97 mg/dL (ref 0.61–1.24)
GFR calc Af Amer: >60 mL/min (ref >60)
GFR calc non Af Amer: >60 mL/min (ref >60)
Glucose, Bld: 158 mg/dL — ABNORMAL HIGH (ref 70–99)
Potassium: 4.3 mmol/L (ref 3.5–5.1)
Sodium: 127 mmol/L — ABNORMAL LOW (ref 135–145)
Total Bilirubin: 1.9 mg/dL — ABNORMAL HIGH (ref 0.3–1.2)
Total Protein: 8.5 g/dL — ABNORMAL HIGH (ref 6.5–8.1)

## 2019-05-23 LAB — URINALYSIS, ROUTINE W REFLEX MICROSCOPIC
Bilirubin Urine: NEGATIVE
Glucose, UA: NEGATIVE mg/dL
Ketones, ur: NEGATIVE mg/dL
Leukocytes,Ua: NEGATIVE
Nitrite: NEGATIVE
Protein, ur: 100 mg/dL — AB
Specific Gravity, Urine: 1.005 (ref 1.005–1.030)
pH: 6 (ref 5.0–8.0)

## 2019-05-23 LAB — SARS CORONAVIRUS 2 BY RT PCR (HOSPITAL ORDER, PERFORMED IN ~~LOC~~ HOSPITAL LAB): SARS Coronavirus 2: NEGATIVE

## 2019-05-23 LAB — CBC
HCT: 40.9 % (ref 39.0–52.0)
Hemoglobin: 14 g/dL (ref 13.0–17.0)
MCH: 30.2 pg (ref 26.0–34.0)
MCHC: 34.2 g/dL (ref 30.0–36.0)
MCV: 88.3 fL (ref 80.0–100.0)
Platelets: 196 10*3/uL (ref 150–400)
RBC: 4.63 MIL/uL (ref 4.22–5.81)
RDW: 15 % (ref 11.5–15.5)
WBC: 5.5 10*3/uL (ref 4.0–10.5)
nRBC: 0 % (ref 0.0–0.2)

## 2019-05-23 LAB — TYPE AND SCREEN
ABO/RH(D): B POS
Antibody Screen: NEGATIVE

## 2019-05-23 LAB — PROTIME-INR
INR: 1.2 (ref 0.8–1.2)
Prothrombin Time: 14.8 seconds (ref 11.4–15.2)

## 2019-05-23 LAB — CREATININE, URINE, RANDOM: Creatinine, Urine: 33.53 mg/dL

## 2019-05-23 LAB — ETHANOL: Alcohol, Ethyl (B): <10 mg/dL (ref <10)

## 2019-05-23 LAB — SODIUM, URINE, RANDOM: Sodium, Ur: 32 mmol/L

## 2019-05-23 LAB — RAPID URINE DRUG SCREEN, HOSP PERFORMED
Amphetamines: NOT DETECTED
Barbiturates: NOT DETECTED
Benzodiazepines: NOT DETECTED
Cocaine: POSITIVE — AB
Opiates: NOT DETECTED
Tetrahydrocannabinol: NOT DETECTED

## 2019-05-23 LAB — BRAIN NATRIURETIC PEPTIDE: B Natriuretic Peptide: 3418.4 pg/mL — ABNORMAL HIGH (ref 0.0–100.0)

## 2019-05-23 LAB — ABO/RH: ABO/RH(D): B POS

## 2019-05-23 LAB — TROPONIN I
Troponin I: 0.06 ng/mL (ref <0.03)
Troponin I: 0.05 ng/mL (ref <0.03)

## 2019-05-23 LAB — LIPASE, BLOOD: Lipase: 25 U/L (ref 11–51)

## 2019-05-23 MED ORDER — VITAMIN B-1 100 MG PO TABS
100.0000 mg | ORAL_TABLET | Freq: Every day | ORAL | Status: DC
  Administered 2019-05-24 – 2019-05-28 (×5): 100 mg via ORAL
  Filled 2019-05-23 (×5): qty 1

## 2019-05-23 MED ORDER — INSULIN ASPART 100 UNIT/ML ~~LOC~~ SOLN
0.0000 [IU] | Freq: Three times a day (TID) | SUBCUTANEOUS | Status: DC
  Administered 2019-05-24 (×2): 1 [IU] via SUBCUTANEOUS
  Administered 2019-05-25: 5 [IU] via SUBCUTANEOUS
  Administered 2019-05-26 – 2019-05-27 (×4): 1 [IU] via SUBCUTANEOUS
  Administered 2019-05-27: 2 [IU] via SUBCUTANEOUS
  Administered 2019-05-28: 1 [IU] via SUBCUTANEOUS
  Administered 2019-05-28: 2 [IU] via SUBCUTANEOUS

## 2019-05-23 MED ORDER — ONDANSETRON HCL 4 MG/2ML IJ SOLN
4.0000 mg | Freq: Once | INTRAMUSCULAR | Status: AC
  Administered 2019-05-23: 4 mg via INTRAVENOUS
  Filled 2019-05-23: qty 2

## 2019-05-23 MED ORDER — FUROSEMIDE 10 MG/ML IJ SOLN
40.0000 mg | Freq: Once | INTRAMUSCULAR | Status: AC
  Administered 2019-05-23: 40 mg via INTRAVENOUS
  Filled 2019-05-23: qty 4

## 2019-05-23 MED ORDER — SODIUM CHLORIDE 0.9% FLUSH: 3.0000 mL | INTRAVENOUS | Status: DC | PRN

## 2019-05-23 MED ORDER — FOLIC ACID 1 MG PO TABS
1.0000 mg | ORAL_TABLET | Freq: Every day | ORAL | Status: DC
  Administered 2019-05-24 – 2019-05-28 (×5): 1 mg via ORAL
  Filled 2019-05-23 (×5): qty 1

## 2019-05-23 MED ORDER — HYDROCODONE-ACETAMINOPHEN 5-325 MG PO TABS
1.0000 | ORAL_TABLET | ORAL | Status: DC | PRN
  Administered 2019-05-24 – 2019-05-28 (×12): 2 via ORAL
  Filled 2019-05-23 (×13): qty 2

## 2019-05-23 MED ORDER — SODIUM CHLORIDE 0.9 % IV SOLN: 250.0000 mL | INTRAVENOUS | Status: DC | PRN

## 2019-05-23 MED ORDER — ONDANSETRON HCL 4 MG PO TABS: 4.0000 mg | ORAL_TABLET | Freq: Four times a day (QID) | ORAL | Status: DC | PRN

## 2019-05-23 MED ORDER — ACETAMINOPHEN 650 MG RE SUPP: 650.0000 mg | Freq: Four times a day (QID) | RECTAL | Status: DC | PRN

## 2019-05-23 MED ORDER — ACETAMINOPHEN 325 MG PO TABS: 650.0000 mg | ORAL_TABLET | Freq: Four times a day (QID) | ORAL | Status: DC | PRN

## 2019-05-23 MED ORDER — INSULIN ASPART 100 UNIT/ML ~~LOC~~ SOLN
0.0000 [IU] | Freq: Every day | SUBCUTANEOUS | Status: DC
  Administered 2019-05-24: 2 [IU] via SUBCUTANEOUS

## 2019-05-23 MED ORDER — SPIRONOLACTONE 25 MG PO TABS
25.0000 mg | ORAL_TABLET | Freq: Every day | ORAL | Status: DC
  Administered 2019-05-24 – 2019-05-28 (×5): 25 mg via ORAL
  Filled 2019-05-23 (×5): qty 1

## 2019-05-23 MED ORDER — FUROSEMIDE 10 MG/ML IJ SOLN
40.0000 mg | Freq: Two times a day (BID) | INTRAMUSCULAR | Status: DC
  Administered 2019-05-24: 40 mg via INTRAVENOUS
  Filled 2019-05-23: qty 4

## 2019-05-23 MED ORDER — LORATADINE 10 MG PO TABS
10.0000 mg | ORAL_TABLET | Freq: Every day | ORAL | Status: DC
  Administered 2019-05-24 – 2019-05-28 (×5): 10 mg via ORAL
  Filled 2019-05-23 (×5): qty 1

## 2019-05-23 MED ORDER — ATORVASTATIN CALCIUM 40 MG PO TABS
40.0000 mg | ORAL_TABLET | Freq: Every day | ORAL | Status: DC
  Administered 2019-05-24 – 2019-05-28 (×5): 40 mg via ORAL
  Filled 2019-05-23 (×5): qty 1

## 2019-05-23 MED ORDER — ADULT MULTIVITAMIN W/MINERALS CH
1.0000 | ORAL_TABLET | Freq: Every day | ORAL | Status: DC
  Administered 2019-05-24 – 2019-05-28 (×5): 1 via ORAL
  Filled 2019-05-23 (×5): qty 1

## 2019-05-23 MED ORDER — LOSARTAN POTASSIUM 25 MG PO TABS
25.0000 mg | ORAL_TABLET | Freq: Every day | ORAL | Status: DC
  Administered 2019-05-24 – 2019-05-26 (×3): 25 mg via ORAL
  Filled 2019-05-23 (×3): qty 1

## 2019-05-23 MED ORDER — APIXABAN 5 MG PO TABS
5.0000 mg | ORAL_TABLET | Freq: Two times a day (BID) | ORAL | Status: DC
  Administered 2019-05-24 – 2019-05-28 (×10): 5 mg via ORAL
  Filled 2019-05-23 (×10): qty 1

## 2019-05-23 MED ORDER — ONDANSETRON HCL 4 MG/2ML IJ SOLN
4.0000 mg | Freq: Four times a day (QID) | INTRAMUSCULAR | Status: DC | PRN
  Administered 2019-05-26 – 2019-05-27 (×2): 4 mg via INTRAVENOUS
  Filled 2019-05-23 (×2): qty 2

## 2019-05-23 MED ORDER — NICOTINE 21 MG/24HR TD PT24
21.0000 mg | MEDICATED_PATCH | Freq: Every day | TRANSDERMAL | Status: DC
  Administered 2019-05-24 – 2019-05-28 (×5): 21 mg via TRANSDERMAL
  Filled 2019-05-23 (×5): qty 1

## 2019-05-23 MED ORDER — ISOSORBIDE MONONITRATE ER 30 MG PO TB24
30.0000 mg | ORAL_TABLET | Freq: Every day | ORAL | Status: DC
  Administered 2019-05-24 – 2019-05-26 (×3): 30 mg via ORAL
  Filled 2019-05-23 (×3): qty 1

## 2019-05-23 MED ORDER — SODIUM CHLORIDE 0.9% FLUSH
3.0000 mL | Freq: Two times a day (BID) | INTRAVENOUS | Status: DC
  Administered 2019-05-24 – 2019-05-28 (×9): 3 mL via INTRAVENOUS

## 2019-05-23 MED ORDER — LORAZEPAM 2 MG/ML IJ SOLN: 1.0000 mg | Freq: Four times a day (QID) | INTRAMUSCULAR | Status: AC | PRN

## 2019-05-23 MED ORDER — THIAMINE HCL 100 MG/ML IJ SOLN: 100.0000 mg | Freq: Every day | INTRAMUSCULAR | Status: DC

## 2019-05-23 MED ORDER — LEVALBUTEROL HCL 0.63 MG/3ML IN NEBU: 0.6300 mg | INHALATION_SOLUTION | Freq: Four times a day (QID) | RESPIRATORY_TRACT | Status: DC | PRN

## 2019-05-23 MED ORDER — LORAZEPAM 1 MG PO TABS
1.0000 mg | ORAL_TABLET | Freq: Four times a day (QID) | ORAL | Status: AC | PRN
  Administered 2019-05-24 – 2019-05-25 (×2): 1 mg via ORAL
  Filled 2019-05-23 (×2): qty 1

## 2019-05-23 MED ORDER — GABAPENTIN 300 MG PO CAPS
600.0000 mg | ORAL_CAPSULE | Freq: Two times a day (BID) | ORAL | Status: DC
  Administered 2019-05-24 – 2019-05-27 (×8): 600 mg via ORAL
  Filled 2019-05-23 (×8): qty 2

## 2019-05-23 MED ORDER — HYDRALAZINE HCL 25 MG PO TABS
37.5000 mg | ORAL_TABLET | Freq: Three times a day (TID) | ORAL | Status: DC
  Administered 2019-05-24 – 2019-05-26 (×8): 37.5 mg via ORAL
  Filled 2019-05-23 (×8): qty 2

## 2019-05-23 NOTE — ED Notes (Signed)
MD made aware of cardiac strip.

## 2019-05-23 NOTE — TOC Initial Note (Signed)
Transition of Care [facility]) - Initial/Assessment Note    Patient Details  Name: Brad [name] MRN: [id number] Date of Birth: [birth year]  [facility] CM/SW Contact:    Erenest Rasher, RN Phone Number: 05/23/2019, 3:58 PM  Clinical Narrative:    5 ED visits in 6 months              Spoke to pt and he is followed closely at [facility] through the [place] program. Sent message to Carlis Abbott RN TCC Coordinator. Pt states he has medications at home. He is currently living in hotel.   Expected Discharge Plan: Home/Self Care Barriers to Discharge: Continued Medical Work up   Patient Goals and CMS Choice        Expected Discharge Plan and Services Expected Discharge Plan: Home/Self Care   Discharge Planning Services: CM Consult                                          Prior Living Arrangements/Services     Patient language and need for interpreter reviewed:: Yes Do you feel safe going back to the place where you live?: Yes      Need for Family Participation in Patient Care: No (Comment) Care giver support system in place?: No (comment)   Criminal Activity/Legal Involvement Pertinent to Current Situation/Hospitalization: No - Comment as needed  Activities of Daily Living      Permission Sought/Granted Permission sought to share information with : Case Manager, PCP Permission granted to share information with : Yes, Verbal Permission Granted              Emotional Assessment   Attitude/Demeanor/Rapport: Engaged Affect (typically observed): Accepting Orientation: : Oriented to Self, Oriented to Place, Oriented to  Time, Oriented to Situation Alcohol / Substance Use: Tobacco Use Psych Involvement: No (comment)  Admission diagnosis:  Blood in Stool GI Bleed Patient Active Problem List   Diagnosis Date Noted  . Acute on chronic systolic (congestive) heart failure ([place]) 02/22/2019  . Acute on chronic systolic congestive heart failure (Stoutland)  02/21/2019  . Alcohol abuse 02/21/2019  . Frequent falls 01/17/2019  . Severe mitral regurgitation   . Fall 11/01/2018  . Hyponatremia 10/31/2018  . Abdominal distention   . Acute systolic congestive heart failure ([place]ston[place])   . Chronic atrial fibrillation   . Chronic hyponatremia 08/16/2018  . NSVT (nonsustained ventricular tachycardia) ([place]ates[place])   . Concussion with loss of consciousness   . Scalp laceration   . Trauma   . Permanent atrial fibrillation   . Hypertensive heart disease   . Shortness of breath   . ACS (acute coronary syndrome) (Crockett) 07/25/2018  . Pyogenic inflammation of bone (Parrott)   . Ankle swelling, right 04/30/2018  . Cirrhosis (Laramie) 03/23/2018  . Right ankle pain 03/23/2018  . Acute respiratory failure with hypoxia (Nashotah) 11/06/2017  . Syncope 08/07/2017  . Acute on chronic combined systolic and diastolic heart failure (Bloomingdale) 07/09/2017  . Atrial flutter ([place]) 07/09/2017  . Pre-syncope 07/08/2017  . Neuropathy 05/08/2017  . Substance induced mood disorder ([place]burg) 10/06/2016  . CVA (cerebral vascular accident) (Pflug[place]) 09/18/2016  . Left sided numbness   . Homelessness 08/21/2016  . S/P ORIF (open reduction internal fixation) fracture 08/01/2016  . CAD (coronary artery disease), native coronary artery 07/30/2016  . Surgery, elective   . Insomnia 07/22/2016  . Acute diastolic heart failure ([place])   .  NSTEMI (non-ST elevated myocardial infarction) (Proctor)   . Normocytic anemia 07/05/2016  . Thrombocytopenia ([place] Haci[place]) 07/05/2016  . AKI (acute kidney injury) (Emmitsburg)   . Cocaine abuse ([place]rich[place]) 07/02/2016  . Essential hypertension 07/01/2016  . Type 2 diabetes mellitus with complication, without long-term current use of insulin (Jeromesville) 07/01/2016  . Hypokalemia 07/01/2016  . CKD (chronic kidney disease) stage 3, GFR 30-59 ml/min (HCC) 07/01/2016  . Painful diabetic neuropathy ([place]) 07/01/2016  . Polysubstance abuse ([place]. [place]) 05/27/2016  . Chronic hepatitis C with cirrhosis (Jennings) 05/27/2016   . Chronic diastolic congestive heart failure ([place]) 01/31/2016  . Depression 04/21/2012  . GERD (gastroesophageal reflux disease) 02/16/2012  . History of drug abuse ([place]ammond)   . Heroin addiction ([place]) 01/29/2012   PCP:  Charlott Rakes, MD Pharmacy:   [place], [place] - [address] [place] [address] Phone: [phone] Fax: [phone]     Social Determinants of Health (SDOH) Interventions    Readmission Risk Interventions No flowsheet data found.

## 2019-05-23 NOTE — Telephone Encounter (Signed)
Received phone call from Brad [name] stating he was having trouble breathing and having blood in his stool. He stated he called 911 for an ambulance to take him to the ED. Patient stated he would contact me later while in the ED.   [name] stated he is currently staying at [facility] off [address]. With his girlfriend.
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
## 2019-05-23 NOTE — ED Notes (Signed)
Pt ambulated to RR without assistance.
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
## 2019-05-23 NOTE — ED Triage Notes (Signed)
The patient is from home and presents with bright red blood in his stools. He had a bowel movement with EMS and no blood was noted. He also complains of emesis and shortness of breath starting this AM. He has a history of CHF and is a current smoker.   EMS vitals: 130/100 BP 110 HR  96% on room air 169 CBG 97.5 Temp

## 2019-05-23 NOTE — ED Notes (Signed)
Date and time results received: 05/23/19 17:50   Test: Troponin  Critical Value: 0.06  Name of Provider Notified: Dr. Hillard Danker   Orders Received? Or Actions Taken?: Will continue to monitor and await new orders.

## 2019-05-23 NOTE — ED Notes (Signed)
Bed: WA02 Expected date:  Expected time:  Means of arrival:  Comments: EMS SHOB/GI Bleed

## 2019-05-24 DIAGNOSIS — I361 Nonrheumatic tricuspid (valve) insufficiency: Secondary | ICD-10-CM | POA: Diagnosis not present

## 2019-05-24 DIAGNOSIS — Z1159 Encounter for screening for other viral diseases: Secondary | ICD-10-CM | POA: Diagnosis not present

## 2019-05-24 DIAGNOSIS — F17210 Nicotine dependence, cigarettes, uncomplicated: Secondary | ICD-10-CM | POA: Diagnosis present

## 2019-05-24 DIAGNOSIS — I5023 Acute on chronic systolic (congestive) heart failure: Secondary | ICD-10-CM

## 2019-05-24 DIAGNOSIS — K7030 Alcoholic cirrhosis of liver without ascites: Secondary | ICD-10-CM | POA: Diagnosis present

## 2019-05-24 DIAGNOSIS — E7800 Pure hypercholesterolemia, unspecified: Secondary | ICD-10-CM | POA: Diagnosis present

## 2019-05-24 DIAGNOSIS — I2510 Atherosclerotic heart disease of native coronary artery without angina pectoris: Secondary | ICD-10-CM | POA: Diagnosis present

## 2019-05-24 DIAGNOSIS — I4892 Unspecified atrial flutter: Secondary | ICD-10-CM | POA: Diagnosis present

## 2019-05-24 DIAGNOSIS — N183 Chronic kidney disease, stage 3 (moderate): Secondary | ICD-10-CM | POA: Diagnosis present

## 2019-05-24 DIAGNOSIS — E785 Hyperlipidemia, unspecified: Secondary | ICD-10-CM | POA: Diagnosis present

## 2019-05-24 DIAGNOSIS — I130 Hypertensive heart and chronic kidney disease with heart failure and stage 1 through stage 4 chronic kidney disease, or unspecified chronic kidney disease: Secondary | ICD-10-CM | POA: Diagnosis present

## 2019-05-24 DIAGNOSIS — N179 Acute kidney failure, unspecified: Secondary | ICD-10-CM | POA: Diagnosis present

## 2019-05-24 DIAGNOSIS — Z9114 Patient's other noncompliance with medication regimen: Secondary | ICD-10-CM | POA: Diagnosis not present

## 2019-05-24 DIAGNOSIS — F1010 Alcohol abuse, uncomplicated: Secondary | ICD-10-CM | POA: Diagnosis not present

## 2019-05-24 DIAGNOSIS — I509 Heart failure, unspecified: Secondary | ICD-10-CM | POA: Diagnosis present

## 2019-05-24 DIAGNOSIS — Z79899 Other long term (current) drug therapy: Secondary | ICD-10-CM | POA: Diagnosis not present

## 2019-05-24 DIAGNOSIS — Z590 Homelessness: Secondary | ICD-10-CM | POA: Diagnosis not present

## 2019-05-24 DIAGNOSIS — E1142 Type 2 diabetes mellitus with diabetic polyneuropathy: Secondary | ICD-10-CM | POA: Diagnosis present

## 2019-05-24 DIAGNOSIS — B182 Chronic viral hepatitis C: Secondary | ICD-10-CM | POA: Diagnosis present

## 2019-05-24 DIAGNOSIS — I5043 Acute on chronic combined systolic (congestive) and diastolic (congestive) heart failure: Secondary | ICD-10-CM | POA: Diagnosis present

## 2019-05-24 DIAGNOSIS — F1410 Cocaine abuse, uncomplicated: Secondary | ICD-10-CM | POA: Diagnosis present

## 2019-05-24 DIAGNOSIS — R7989 Other specified abnormal findings of blood chemistry: Secondary | ICD-10-CM

## 2019-05-24 DIAGNOSIS — K7460 Unspecified cirrhosis of liver: Secondary | ICD-10-CM | POA: Diagnosis not present

## 2019-05-24 DIAGNOSIS — E1122 Type 2 diabetes mellitus with diabetic chronic kidney disease: Secondary | ICD-10-CM | POA: Diagnosis present

## 2019-05-24 DIAGNOSIS — I440 Atrioventricular block, first degree: Secondary | ICD-10-CM | POA: Diagnosis present

## 2019-05-24 DIAGNOSIS — K219 Gastro-esophageal reflux disease without esophagitis: Secondary | ICD-10-CM | POA: Diagnosis present

## 2019-05-24 DIAGNOSIS — K921 Melena: Secondary | ICD-10-CM | POA: Diagnosis present

## 2019-05-24 DIAGNOSIS — E871 Hypo-osmolality and hyponatremia: Secondary | ICD-10-CM | POA: Diagnosis present

## 2019-05-24 DIAGNOSIS — Z7901 Long term (current) use of anticoagulants: Secondary | ICD-10-CM | POA: Diagnosis not present

## 2019-05-24 DIAGNOSIS — I4821 Permanent atrial fibrillation: Secondary | ICD-10-CM | POA: Diagnosis present

## 2019-05-24 DIAGNOSIS — I340 Nonrheumatic mitral (valve) insufficiency: Secondary | ICD-10-CM | POA: Diagnosis not present

## 2019-05-24 LAB — TSH: TSH: 3.378 u[IU]/mL (ref 0.350–4.500)

## 2019-05-24 LAB — HEMOGLOBIN A1C
Hgb A1c MFr Bld: 7.3 % — ABNORMAL HIGH (ref 4.8–5.6)
Mean Plasma Glucose: 162.81 mg/dL

## 2019-05-24 LAB — COMPREHENSIVE METABOLIC PANEL
ALT: 28 U/L (ref 0–44)
AST: 36 U/L (ref 15–41)
Albumin: 3.4 g/dL — ABNORMAL LOW (ref 3.5–5.0)
Alkaline Phosphatase: 84 U/L (ref 38–126)
Anion gap: 10 (ref 5–15)
BUN: 18 mg/dL (ref 6–20)
CO2: 21 mmol/L — ABNORMAL LOW (ref 22–32)
Calcium: 8.7 mg/dL — ABNORMAL LOW (ref 8.9–10.3)
Chloride: 97 mmol/L — ABNORMAL LOW (ref 98–111)
Creatinine, Ser: 1.29 mg/dL — ABNORMAL HIGH (ref 0.61–1.24)
GFR calc Af Amer: >60 mL/min (ref >60)
GFR calc non Af Amer: >60 mL/min (ref >60)
Glucose, Bld: 117 mg/dL — ABNORMAL HIGH (ref 70–99)
Potassium: 4.3 mmol/L (ref 3.5–5.1)
Sodium: 128 mmol/L — ABNORMAL LOW (ref 135–145)
Total Bilirubin: 1.9 mg/dL — ABNORMAL HIGH (ref 0.3–1.2)
Total Protein: 8 g/dL (ref 6.5–8.1)

## 2019-05-24 LAB — CBC
HCT: 39.2 % (ref 39.0–52.0)
Hemoglobin: 13 g/dL (ref 13.0–17.0)
MCH: 29.7 pg (ref 26.0–34.0)
MCHC: 33.2 g/dL (ref 30.0–36.0)
MCV: 89.5 fL (ref 80.0–100.0)
Platelets: 187 10*3/uL (ref 150–400)
RBC: 4.38 MIL/uL (ref 4.22–5.81)
RDW: 15.1 % (ref 11.5–15.5)
WBC: 5.4 10*3/uL (ref 4.0–10.5)
nRBC: 0 % (ref 0.0–0.2)

## 2019-05-24 LAB — PHOSPHORUS: Phosphorus: 3.2 mg/dL (ref 2.5–4.6)

## 2019-05-24 LAB — MAGNESIUM: Magnesium: 2 mg/dL (ref 1.7–2.4)

## 2019-05-24 LAB — GLUCOSE, CAPILLARY
Glucose-Capillary: 219 mg/dL — ABNORMAL HIGH (ref 70–99)
Glucose-Capillary: 122 mg/dL — ABNORMAL HIGH (ref 70–99)
Glucose-Capillary: 92 mg/dL (ref 70–99)
Glucose-Capillary: 133 mg/dL — ABNORMAL HIGH (ref 70–99)
Glucose-Capillary: 121 mg/dL — ABNORMAL HIGH (ref 70–99)

## 2019-05-24 LAB — OSMOLALITY, URINE: Osmolality, Ur: 203 mOsm/kg — ABNORMAL LOW (ref 300–900)

## 2019-05-24 LAB — TROPONIN I
Troponin I: 0.05 ng/mL (ref <0.03)
Troponin I: 0.05 ng/mL (ref <0.03)

## 2019-05-24 MED ORDER — FUROSEMIDE 10 MG/ML IJ SOLN
60.0000 mg | Freq: Two times a day (BID) | INTRAMUSCULAR | Status: DC
  Administered 2019-05-24 – 2019-05-25 (×2): 60 mg via INTRAVENOUS
  Filled 2019-05-24 (×2): qty 6

## 2019-05-24 MED ORDER — CARVEDILOL 3.125 MG PO TABS
3.1250 mg | ORAL_TABLET | Freq: Two times a day (BID) | ORAL | Status: DC
  Administered 2019-05-24 – 2019-05-28 (×9): 3.125 mg via ORAL
  Filled 2019-05-24 (×9): qty 1

## 2019-05-24 NOTE — Progress Notes (Signed)
PROGRESS NOTE    Brad [name]  HFW:[id number] DOB: 1961/09/26 DOA: 05/23/2019 PCP: Charlott Rakes, MD   Brief Narrative: Patient is a 58 year old male with history of combined systolic and diastolic CHF with ejection fraction of 20%, cardiomyopathy suspected to be secondary to alcohol/cocaine abuse, atrial flutter, cirrhosis, hepatitis C, diabetes type 2, CKD stage III, hyperlipidemia, tobacco abuse, chronic alcohol abuse, hypertension who presented to the emergency department shortness of breath.  He was admitted for the management of CHF exacerbation.  Started on IV diuresis.  Cardiology consulted.  Assessment & Plan:   Active Problems:   History of drug abuse (HCC)   GERD (gastroesophageal reflux disease)   Chronic hepatitis C with cirrhosis ([place])   Essential hypertension   Type 2 diabetes mellitus with complication, without long-term current use of insulin (HCC)   CKD (chronic kidney disease) stage 3, GFR 30-59 ml/min (HCC)   Acute on chronic combined systolic and diastolic heart failure (HCC)   Atrial flutter (HCC)   Cirrhosis (HCC)   Chronic hyponatremia   Alcohol abuse   Elevated troponin   Tobacco abuse   CHF exacerbation (HCC)  Acute on chronic combined systolic/diastolic heart failure: Secondary to noncompliance, cocaine abuse, alcohol abuse.  Mild elevated troponin secondary to CHF.  Continue diuresis with IV lasix.  Restrict fluid intake to less than 1.2 L a day. Cardiology consulted.Started on ARB.Continue spironolactone.also supposed to be on torsemide,hydralazine and Imdur at home.  Atrial flutter:CHA2DS2 vas score 4.  Continue Eliquis.  Currently rate is controlled.  Continue Coreg.  Hepatitis C with cirrhosis: Viral load pending.  Needs to follow-up as an outpatient for treatment.  CKD stage III: Currently kidney function on baseline.  Hyponatremia: Most likely hypervolemic hyponatremia secondary to CHF.  Continue diuresis.  Chronic alcohol abuse: Initiated  CIWA protocol.  Diabetes mellitus: Continue sliding scale insulin.HbA1C of 7.3  Suspected hematochezia: Minimal.  Not bleeding at present.  Hemoglobin stable.  No need of further evaluation.  Substance abuse: Uses cocaine.  Counseled for cessation  Smoker: Smokes half pack a day.  Counseled for cessation.  Continue nicotine patch  Homelessness: Education officer, museum consulted.        DVT prophylaxis: Eliquis Code Status: Full Family Communication: None present at the bedside Disposition Plan: Back to the previous environment after stabilization of CHF status   Consultants: Cardiology  Procedures: None  Antimicrobials:  Anti-infectives (From admission, onward)   None      Subjective:  Patient seen and examined the bedside this morning.  Hemodynamically stable.  Heart rate well controlled.  Denies any chest pain or shortness of breath.  Lower extremity edema has significantly improved. Objective: Vitals:   05/23/19 2100 05/23/19 2149 05/23/19 2213 05/24/19 0436  BP:  118/85 126/85 (!) 127/93  Pulse: (!) 104 (!) 107 99 83  Resp: (!) 23 (!) 21 20 18   Temp:   98.8 F (37.1 C) 97.6 F (36.4 C)  TempSrc:   Oral Oral  SpO2: 100% 100% 100% 99%  Weight:    72.6 kg  Height:   5\' 11"  (1.803 m)     Intake/Output Summary (Last 24 hours) at 05/24/2019 1244 Last data filed at 05/24/2019 0920 Gross per 24 hour  Intake 720 ml  Output 450 ml  Net 270 ml   Filed Weights   05/23/19 1600 05/24/19 0436  Weight: 81.2 kg 72.6 kg    Examination:  General exam: Appears calm and comfortable ,Not in distress,average built HEENT:PERRL,Oral mucosa moist, Ear/Nose normal on gross  exam Respiratory system: Bilateral equal air entry, normal vesicular breath sounds, no wheezes or crackles  Cardiovascular system: S1 & S2 heard, RRR. No JVD, murmurs, rubs, gallops or clicks. No pedal edema. Gastrointestinal system: Abdomen is nondistended, soft and nontender. No organomegaly or masses felt. Normal  bowel sounds heard. Central nervous system: Alert and oriented. No focal neurological deficits. Extremities: No edema, no clubbing ,no cyanosis, distal peripheral pulses palpable. Skin: No rashes, lesions or ulcers,no icterus ,no pallor MSK: Normal muscle bulk,tone ,power Psychiatry: Judgement and insight appear normal. Mood & affect appropriate.     Data Reviewed: I have personally reviewed following labs and imaging studies  CBC: Recent Labs  Lab 05/23/19 1636 05/24/19 0216  WBC 5.5 5.4  HGB 14.0 13.0  HCT 40.9 39.2  MCV 88.3 89.5  PLT 196 952   Basic Metabolic Panel: Recent Labs  Lab 05/23/19 1636 05/24/19 0216  NA 127* 128*  K 4.3 4.3  CL 97* 97*  CO2 21* 21*  GLUCOSE 158* 117*  BUN 13 18  CREATININE 0.97 1.29*  CALCIUM 8.7* 8.7*  MG  --  2.0  PHOS  --  3.2   GFR: Estimated Creatinine Clearance: 63.3 mL/min (A) (by C-G formula based on SCr of 1.29 mg/dL (H)). Liver Function Tests: Recent Labs  Lab 05/23/19 1636 05/24/19 0216  AST 32 36  ALT 27 28  ALKPHOS 81 84  BILITOT 1.9* 1.9*  PROT 8.5* 8.0  ALBUMIN 3.6 3.4*   Recent Labs  Lab 05/23/19 1636  LIPASE 25   No results for input(s): AMMONIA in the last 168 hours. Coagulation Profile: Recent Labs  Lab 05/23/19 1636  INR 1.2   Cardiac Enzymes: Recent Labs  Lab 05/23/19 1636 05/23/19 1937 05/24/19 0216 05/24/19 0741  TROPONINI 0.06* 0.05* 0.05* 0.05*   BNP (last 3 results) No results for input(s): PROBNP in the last 8760 hours. HbA1C: Recent Labs    05/24/19 0216  HGBA1C 7.3*   CBG: Recent Labs  Lab 05/24/19 0113 05/24/19 0746 05/24/19 1146  GLUCAP 219* 122* 92   Lipid Profile: No results for input(s): CHOL, HDL, LDLCALC, TRIG, CHOLHDL, LDLDIRECT in the last 72 hours. Thyroid Function Tests: Recent Labs    05/24/19 0216  TSH 3.378   Anemia Panel: No results for input(s): VITAMINB12, FOLATE, FERRITIN, TIBC, IRON, RETICCTPCT in the last 72 hours. Sepsis Labs: No results  for input(s): PROCALCITON, LATICACIDVEN in the last 168 hours.  Recent Results (from the past 240 hour(s))  SARS Coronavirus 2 (CEPHEID - Performed in [place] hospital lab), Hosp Order     Status: None   Collection Time: 05/23/19  6:34 PM  Result Value Ref Range Status   SARS Coronavirus 2 NEGATIVE NEGATIVE Final    Comment: (NOTE) If result is NEGATIVE SARS-CoV-2 target nucleic acids are NOT DETECTED. The SARS-CoV-2 RNA is generally detectable in upper and lower  respiratory specimens during the acute phase of infection. The lowest  concentration of SARS-CoV-2 viral copies this assay can detect is 250  copies / mL. A negative result does not preclude SARS-CoV-2 infection  and should not be used as the sole basis for treatment or other  patient management decisions.  A negative result may occur with  improper specimen collection / handling, submission of specimen other  than nasopharyngeal swab, presence of viral mutation(s) within the  areas targeted by this assay, and inadequate number of viral copies  (<250 copies / mL). A negative result must be combined with clinical  observations, patient history, and epidemiological information. If result is POSITIVE SARS-CoV-2 target nucleic acids are DETECTED. The SARS-CoV-2 RNA is generally detectable in upper and lower  respiratory specimens dur ing the acute phase of infection.  Positive  results are indicative of active infection with SARS-CoV-2.  Clinical  correlation with patient history and other diagnostic information is  necessary to determine patient infection status.  Positive results do  not rule out bacterial infection or co-infection with other viruses. If result is PRESUMPTIVE POSTIVE SARS-CoV-2 nucleic acids MAY BE PRESENT.   A presumptive positive result was obtained on the submitted specimen  and confirmed on repeat testing.  While 2019 novel coronavirus  (SARS-CoV-2) nucleic acids may be present in the submitted  sample  additional confirmatory testing may be necessary for epidemiological  and / or clinical management purposes  to differentiate between  SARS-CoV-2 and other Sarbecovirus currently known to infect humans.  If clinically indicated additional testing with an alternate test  methodology [id number]) is advised. The SARS-CoV-2 RNA is generally  detectable in upper and lower respiratory sp ecimens during the acute  phase of infection. The expected result is Negative. Fact Sheet for Patients:  StrictlyIdeas.no Fact Sheet for Healthcare Providers: BankingDealers.co.za This test is not yet approved or cleared by the [place] FDA and has been authorized for detection and/or diagnosis of SARS-CoV-2 by FDA under an Emergency Use Authorization (EUA).  This EUA will remain in effect (meaning this test can be used) for the duration of the COVID-19 declaration under Section 564(b)(1) of the Act, 21 U.S.C. section 360bbb-3(b)(1), unless the authorization is terminated or revoked sooner. Performed at [facility], [place] [address], [place], [place] [address]          Radiology Studies: Dg Chest Port 1 View  Result Date: 05/23/2019 CLINICAL DATA:  Shortness of breath EXAM: PORTABLE CHEST 1 VIEW COMPARISON:  04/16/2019 FINDINGS: Cardiomegaly. Pulmonary venous hypertension with early interstitial edema. No infiltrate, collapse or effusion. No acute bone finding. IMPRESSION: Congestive heart failure.  Cardiomegaly and interstitial edema. Electronically Signed   By: Nelson Chimes M.D.   On: 05/23/2019 16:46        Scheduled Meds: . apixaban  5 mg Oral BID  . atorvastatin  40 mg Oral Daily  . carvedilol  3.125 mg Oral BID WC  . folic acid  1 mg Oral Daily  . furosemide  40 mg Intravenous Q12H  . gabapentin  600 mg Oral BID  . hydrALAZINE  37.5 mg Oral TID  . insulin aspart  0-5 Units Subcutaneous QHS  . insulin aspart  0-9  Units Subcutaneous TID WC  . isosorbide mononitrate  30 mg Oral Daily  . loratadine  10 mg Oral Daily  . losartan  25 mg Oral Daily  . multivitamin with minerals  1 tablet Oral Daily  . nicotine  21 mg Transdermal Daily  . sodium chloride flush  3 mL Intravenous Q12H  . spironolactone  25 mg Oral Daily  . thiamine  100 mg Oral Daily   Or  . thiamine  100 mg Intravenous Daily   Continuous Infusions: . sodium chloride       LOS: 0 days    Time spent:35 mins. More than 50% of that time was spent in counseling and/or coordination of care.      Shelly Coss, MD [facility] Pager [phone]  If 7PM-7AM, please contact night-coverage [email or web address] Password [facility] 05/24/2019, 12:44 PM
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
## 2019-05-25 LAB — BASIC METABOLIC PANEL
Anion gap: 9 (ref 5–15)
BUN: 27 mg/dL — ABNORMAL HIGH (ref 6–20)
CO2: 27 mmol/L (ref 22–32)
Calcium: 8.8 mg/dL — ABNORMAL LOW (ref 8.9–10.3)
Chloride: 95 mmol/L — ABNORMAL LOW (ref 98–111)
Creatinine, Ser: 1.52 mg/dL — ABNORMAL HIGH (ref 0.61–1.24)
GFR calc Af Amer: 57 mL/min — ABNORMAL LOW (ref >60)
GFR calc non Af Amer: 49 mL/min — ABNORMAL LOW (ref >60)
Glucose, Bld: 124 mg/dL — ABNORMAL HIGH (ref 70–99)
Potassium: 4.4 mmol/L (ref 3.5–5.1)
Sodium: 131 mmol/L — ABNORMAL LOW (ref 135–145)

## 2019-05-25 LAB — GLUCOSE, CAPILLARY
Glucose-Capillary: 120 mg/dL — ABNORMAL HIGH (ref 70–99)
Glucose-Capillary: 254 mg/dL — ABNORMAL HIGH (ref 70–99)
Glucose-Capillary: 80 mg/dL (ref 70–99)
Glucose-Capillary: 177 mg/dL — ABNORMAL HIGH (ref 70–99)

## 2019-05-25 LAB — TROPONIN I
Troponin I: 0.03 ng/mL (ref <0.03)
Troponin I: 0.04 ng/mL (ref <0.03)

## 2019-05-25 MED ORDER — ALUM & MAG HYDROXIDE-SIMETH 200-200-20 MG/5ML PO SUSP
30.0000 mL | Freq: Once | ORAL | Status: AC
  Administered 2019-05-25: 30 mL via ORAL
  Filled 2019-05-25: qty 30

## 2019-05-25 MED ORDER — NITROGLYCERIN 0.4 MG SL SUBL
0.4000 mg | SUBLINGUAL_TABLET | SUBLINGUAL | Status: DC | PRN
  Administered 2019-05-25 – 2019-05-26 (×2): 0.4 mg via SUBLINGUAL
  Filled 2019-05-25 (×2): qty 1

## 2019-05-25 MED ORDER — PANTOPRAZOLE SODIUM 40 MG PO TBEC
40.0000 mg | DELAYED_RELEASE_TABLET | Freq: Every day | ORAL | Status: DC
  Administered 2019-05-25 – 2019-05-28 (×4): 40 mg via ORAL
  Filled 2019-05-25 (×4): qty 1

## 2019-05-25 MED ORDER — MORPHINE SULFATE (PF) 2 MG/ML IV SOLN
2.0000 mg | INTRAVENOUS | Status: DC | PRN
  Administered 2019-05-26 – 2019-05-27 (×4): 2 mg via INTRAVENOUS
  Filled 2019-05-25 (×4): qty 1

## 2019-05-25 MED ORDER — IBUPROFEN 200 MG PO TABS: 600.0000 mg | ORAL_TABLET | Freq: Four times a day (QID) | ORAL | Status: DC | PRN

## 2019-05-25 NOTE — Progress Notes (Signed)
DAILY PROGRESS NOTE   Patient Name: Brad [name] Date of Encounter: 05/25/2019 Cardiologist: [name], MD  Chief Complaint   Dizziness, chest pain  Patient Profile   58 yo male with NICM, PAF, chest pain and mildly elevated troponin, presents with shortness of breath and medication non-adherence in the context of cocaine use  Subjective   Diuresed about 1L negative, however, he says he peed much more. BP better controlled. Now back on home meds. Creatinine up from 1.29 to 1.52. He is complaining of dizziness today and chest pain. I got a STAT EKG which showed mild T wave changes and a repeat troponin which was 0.03. Given a GI cocktail and vicodin with minimal relief.  Objective   Vitals:   05/24/19 0436 05/24/19 1500 05/24/19 2037 05/25/19 0502  BP: (!) 127/93 115/75 111/79 114/75  Pulse: 83 67 67 72  Resp: 18 18 18 18   Temp: 97.6 F (36.4 C) 98.7 F (37.1 C) 98.1 F (36.7 C) 98.3 F (36.8 C)  TempSrc: Oral Oral Oral Oral  SpO2: 99% 100% 100% 100%  Weight: 72.6 kg   73.4 kg  Height:        Intake/Output Summary (Last 24 hours) at 05/25/2019 1341 Last data filed at 05/25/2019 0859 Gross per 24 hour  Intake 800 ml  Output 2000 ml  Net -1200 ml   Filed Weights   05/23/19 1600 05/24/19 0436 05/25/19 0502  Weight: 81.2 kg 72.6 kg 73.4 kg    Physical Exam   General appearance: alert and no distress Lungs: clear to auscultation bilaterally Heart: regular rate and rhythm Extremities: extremities normal, atraumatic, no cyanosis or edema Skin: Skin color, texture, turgor normal. No rashes or lesions Neurologic: Grossly normal  Inpatient Medications    Scheduled Meds: . apixaban  5 mg Oral BID  . atorvastatin  40 mg Oral Daily  . carvedilol  3.125 mg Oral BID WC  . folic acid  1 mg Oral Daily  . furosemide  60 mg Intravenous Q12H  . gabapentin  600 mg Oral BID  . hydrALAZINE  37.5 mg Oral TID  . insulin aspart  0-5 Units Subcutaneous QHS  . insulin  aspart  0-9 Units Subcutaneous TID WC  . isosorbide mononitrate  30 mg Oral Daily  . loratadine  10 mg Oral Daily  . losartan  25 mg Oral Daily  . multivitamin with minerals  1 tablet Oral Daily  . nicotine  21 mg Transdermal Daily  . sodium chloride flush  3 mL Intravenous Q12H  . spironolactone  25 mg Oral Daily  . thiamine  100 mg Oral Daily   Or  . thiamine  100 mg Intravenous Daily    Continuous Infusions: . sodium chloride      PRN Meds: sodium chloride, acetaminophen **OR** acetaminophen, HYDROcodone-acetaminophen, levalbuterol, LORazepam **OR** LORazepam, ondansetron **OR** ondansetron (ZOFRAN) IV, sodium chloride flush   Labs   Results for orders placed or performed during the hospital encounter of 05/23/19 (from the past 48 hour(s))  CBC     Status: None   Collection Time: 05/23/19  4:36 PM  Result Value Ref Range   WBC 5.5 4.0 - 10.5 K/uL   RBC 4.63 4.22 - 5.81 MIL/uL   Hemoglobin 14.0 13.0 - 17.0 g/dL   HCT 40.9 39.0 - 52.0 %   MCV 88.3 80.0 - 100.0 fL   MCH 30.2 26.0 - 34.0 pg   MCHC 34.2 30.0 - 36.0 g/dL   RDW 15.0 11.5 -  15.5 %   Platelets 196 150 - 400 K/uL   nRBC 0.0 0.0 - 0.2 %    Comment: Performed at [facility], [place] [address], [place], [place] [address]  Brain natriuretic peptide     Status: Abnormal   Collection Time: 05/23/19  4:36 PM  Result Value Ref Range   B Natriuretic Peptide 3,418.4 (H) 0.0 - 100.0 pg/mL    Comment: Performed at [facility], [place] [address] [place], [place] [address]  Troponin I - ONCE - STAT     Status: Abnormal   Collection Time: 05/23/19  4:36 PM  Result Value Ref Range   Troponin I 0.06 (HH) <0.03 ng/mL    Comment: CRITICAL RESULT CALLED TO, READ BACK BY AND VERIFIED WITH: Ethelda Chick [id number] @ [place] Performed at [place] [address] [place], [place] [address]   Comprehensive metabolic panel     Status: Abnormal   Collection Time:  05/23/19  4:36 PM  Result Value Ref Range   Sodium 127 (L) 135 - 145 mmol/L   Potassium 4.3 3.5 - 5.1 mmol/L   Chloride 97 (L) 98 - 111 mmol/L   CO2 21 (L) 22 - 32 mmol/L   Glucose, Bld 158 (H) 70 - 99 mg/dL   BUN 13 6 - 20 mg/dL   Creatinine, Ser 0.97 0.61 - 1.24 mg/dL   Calcium 8.7 (L) 8.9 - 10.3 mg/dL   Total Protein 8.5 (H) 6.5 - 8.1 g/dL   Albumin 3.6 3.5 - 5.0 g/dL   AST 32 15 - 41 U/L   ALT 27 0 - 44 U/L   Alkaline Phosphatase 81 38 - 126 U/L   Total Bilirubin 1.9 (H) 0.3 - 1.2 mg/dL   GFR calc non Af Amer >60 >60 mL/min   GFR calc Af Amer >60 >60 mL/min   Anion gap 9 5 - 15    Comment: Performed at [facility], [place] [address] [place], [place] [address]  Type and screen [place]     Status: None   Collection Time: 05/23/19  4:36 PM  Result Value Ref Range   ABO/RH(D) B POS    Antibody Screen NEG    Sample Expiration      05/26/2019,2359 Performed at [facility], [place] [address], [place], [place] [address]   Protime-INR     Status: None   Collection Time: 05/23/19  4:36 PM  Result Value Ref Range   Prothrombin Time 14.8 11.4 - 15.2 seconds   INR 1.2 0.8 - 1.2    Comment: (NOTE) INR goal varies based on device and disease states. Performed at [facility] [place], [place] [address], [place], [place] [address]   Lipase, blood     Status: None   Collection Time: 05/23/19  4:36 PM  Result Value Ref Range   Lipase 25 11 - 51 U/L    Comment: Performed at [facility], [place] [address], [place], [place] [address]  ABO/Rh     Status: None   Collection Time: 05/23/19  4:36 PM  Result Value Ref Range   ABO/RH(D)      B POS Performed at [facility], [place] [address] [place], [place] [address]   SARS Coronavirus 2 (CEPHEID - Performed in [facility] hospital lab), Hosp Order     Status: None   Collection Time: 05/23/19  6:34 PM  Result Value Ref  Range   SARS  Coronavirus 2 NEGATIVE NEGATIVE    Comment: (NOTE) If result is NEGATIVE SARS-CoV-2 target nucleic acids are NOT DETECTED. The SARS-CoV-2 RNA is generally detectable in upper and lower  respiratory specimens during the acute phase of infection. The lowest  concentration of SARS-CoV-2 viral copies this assay can detect is 250  copies / mL. A negative result does not preclude SARS-CoV-2 infection  and should not be used as the sole basis for treatment or other  patient management decisions.  A negative result may occur with  improper specimen collection / handling, submission of specimen other  than nasopharyngeal swab, presence of viral mutation(s) within the  areas targeted by this assay, and inadequate number of viral copies  (<250 copies / mL). A negative result must be combined with clinical  observations, patient history, and epidemiological information. If result is POSITIVE SARS-CoV-2 target nucleic acids are DETECTED. The SARS-CoV-2 RNA is generally detectable in upper and lower  respiratory specimens dur ing the acute phase of infection.  Positive  results are indicative of active infection with SARS-CoV-2.  Clinical  correlation with patient history and other diagnostic information is  necessary to determine patient infection status.  Positive results do  not rule out bacterial infection or co-infection with other viruses. If result is PRESUMPTIVE POSTIVE SARS-CoV-2 nucleic acids MAY BE PRESENT.   A presumptive positive result was obtained on the submitted specimen  and confirmed on repeat testing.  While 2019 novel coronavirus  (SARS-CoV-2) nucleic acids may be present in the submitted sample  additional confirmatory testing may be necessary for epidemiological  and / or clinical management purposes  to differentiate between  SARS-CoV-2 and other Sarbecovirus currently known to infect humans.  If clinically indicated additional testing with an alternate test  methodology  [phone]) is advised. The SARS-CoV-2 RNA is generally  detectable in upper and lower respiratory sp ecimens during the acute  phase of infection. The expected result is Negative. Fact Sheet for Patients:  StrictlyIdeas.no Fact Sheet for Healthcare Providers: BankingDealers.co.za This test is not yet approved or cleared by the [place] FDA and has been authorized for detection and/or diagnosis of SARS-CoV-2 by FDA under an Emergency Use Authorization (EUA).  This EUA will remain in effect (meaning this test can be used) for the duration of the COVID-19 declaration under Section 564(b)(1) of the Act, 21 U.S.C. section 360bbb-3(b)(1), unless the authorization is terminated or revoked sooner. Performed at [facility], [place] [address], [place], [place] [address]   Ethanol     Status: None   Collection Time: 05/23/19  7:37 PM  Result Value Ref Range   Alcohol, Ethyl (B) <10 <10 mg/dL    Comment: (NOTE) Lowest detectable limit for serum alcohol is 10 mg/dL. For medical purposes only. Performed at [facility], [place] [address] [place], [place] [address]   Troponin I - Now Then Q6H     Status: Abnormal   Collection Time: 05/23/19  7:37 PM  Result Value Ref Range   Troponin I 0.05 (HH) <0.03 ng/mL    Comment: CRITICAL VALUE NOTED.  VALUE IS CONSISTENT WITH PREVIOUSLY REPORTED AND CALLED VALUE. Performed at [facility], [place] [address], [place], [place] [address]   Creatinine, urine, random     Status: None   Collection Time: 05/23/19  9:06 PM  Result Value Ref Range   Creatinine, Urine 33.53 mg/dL    Comment: Performed at [facility], [address] [place], [place] [address]  Sodium, urine, random     Status: None   Collection Time: 05/23/19  9:06 PM  Result Value Ref Range   Sodium, Ur 32 mmol/L    Comment: Performed at [facility],  [place] [address], [place], [place] [address]  Osmolality, urine     Status: Abnormal   Collection Time: 05/23/19  9:06 PM  Result Value Ref Range   Osmolality, Ur 203 (L) 300 - 900 mOsm/kg    Comment: Performed at [place] [address] [place], [place] [address]  Urinalysis, Routine w reflex microscopic     Status: Abnormal   Collection Time: 05/23/19  9:07 PM  Result Value Ref Range   Color, Urine YELLOW YELLOW   APPearance CLEAR CLEAR   Specific Gravity, Urine 1.005 1.005 - 1.030   pH 6.0 5.0 - 8.0   Glucose, UA NEGATIVE NEGATIVE mg/dL   Hgb urine dipstick SMALL (A) NEGATIVE   Bilirubin Urine NEGATIVE NEGATIVE   Ketones, ur NEGATIVE NEGATIVE mg/dL   Protein, ur 100 (A) NEGATIVE mg/dL   Nitrite NEGATIVE NEGATIVE   Leukocytes,Ua NEGATIVE NEGATIVE   RBC / HPF 0-5 0 - 5 RBC/hpf   WBC, UA 0-5 0 - 5 WBC/hpf   Bacteria, UA RARE (A) NONE SEEN    Comment: Performed at [facility], [place] [address], [place], [place] [address]  Urine rapid drug screen (hosp performed)     Status: Abnormal   Collection Time: 05/23/19  9:07 PM  Result Value Ref Range   Opiates NONE DETECTED NONE DETECTED   Cocaine POSITIVE (A) NONE DETECTED   Benzodiazepines NONE DETECTED NONE DETECTED   Amphetamines NONE DETECTED NONE DETECTED   Tetrahydrocannabinol NONE DETECTED NONE DETECTED   Barbiturates NONE DETECTED NONE DETECTED    Comment: (NOTE) DRUG SCREEN FOR MEDICAL PURPOSES ONLY.  IF CONFIRMATION IS NEEDED FOR ANY PURPOSE, NOTIFY LAB WITHIN 5 DAYS. LOWEST DETECTABLE LIMITS FOR URINE DRUG SCREEN Drug Class                     Cutoff (ng/mL) Amphetamine and metabolites    1000 Barbiturate and metabolites    200 Benzodiazepine                 664 Tricyclics and metabolites     300 Opiates and metabolites        300 Cocaine and metabolites        300 THC                            50 Performed at [facility], [place] [address], [place], [place] [address]    Glucose, capillary     Status: Abnormal   Collection Time: 05/24/19  1:13 AM  Result Value Ref Range   Glucose-Capillary 219 (H) 70 - 99 mg/dL  Troponin I - Now Then Q6H     Status: Abnormal   Collection Time: 05/24/19  2:16 AM  Result Value Ref Range   Troponin I 0.05 (HH) <0.03 ng/mL    Comment: CRITICAL VALUE NOTED.  VALUE IS CONSISTENT WITH PREVIOUSLY REPORTED AND CALLED VALUE. Performed at [facility], [place] [address] [place], [place] [address]   Hemoglobin A1c     Status: Abnormal   Collection Time: 05/24/19  2:16 AM  Result Value Ref Range   Hgb A1c MFr Bld 7.3 (H) 4.8 -  5.6 %    Comment: (NOTE) Pre diabetes:          5.7%-6.4% Diabetes:              >6.4% Glycemic control for   <7.0% adults with diabetes    Mean Plasma Glucose 162.81 mg/dL    Comment: Performed at [facility], [place] [address] [place], [place] [address]  Magnesium     Status: None   Collection Time: 05/24/19  2:16 AM  Result Value Ref Range   Magnesium 2.0 1.7 - 2.4 mg/dL    Comment: Performed at [facility], [place] [address] [place], [place] [address]  Phosphorus     Status: None   Collection Time: 05/24/19  2:16 AM  Result Value Ref Range   Phosphorus 3.2 2.5 - 4.6 mg/dL    Comment: Performed at [facility], [place] [address] [place], [place] [address]  TSH     Status: None   Collection Time: 05/24/19  2:16 AM  Result Value Ref Range   TSH 3.378 0.350 - 4.500 uIU/mL    Comment: Performed by a 3rd Generation assay with a functional sensitivity of <=0.01 uIU/mL. Performed at [facility], [place] [address], [place], [place] [address]   Comprehensive metabolic panel     Status: Abnormal   Collection Time: 05/24/19  2:16 AM  Result Value Ref Range   Sodium 128 (L) 135 - 145 mmol/L   Potassium 4.3 3.5 - 5.1 mmol/L   Chloride 97 (L) 98 - 111 mmol/L   CO2 21 (L) 22 - 32 mmol/L   Glucose, Bld 117 (H) 70 - 99 mg/dL    BUN 18 6 - 20 mg/dL   Creatinine, Ser 1.29 (H) 0.61 - 1.24 mg/dL   Calcium 8.7 (L) 8.9 - 10.3 mg/dL   Total Protein 8.0 6.5 - 8.1 g/dL   Albumin 3.4 (L) 3.5 - 5.0 g/dL   AST 36 15 - 41 U/L   ALT 28 0 - 44 U/L   Alkaline Phosphatase 84 38 - 126 U/L   Total Bilirubin 1.9 (H) 0.3 - 1.2 mg/dL   GFR calc non Af Amer >60 >60 mL/min   GFR calc Af Amer >60 >60 mL/min   Anion gap 10 5 - 15    Comment: Performed at [facility], [place] [address], [place], [place] [address]  CBC     Status: None   Collection Time: 05/24/19  2:16 AM  Result Value Ref Range   WBC 5.4 4.0 - 10.5 K/uL   RBC 4.38 4.22 - 5.81 MIL/uL   Hemoglobin 13.0 13.0 - 17.0 g/dL   HCT 39.2 39.0 - 52.0 %   MCV 89.5 80.0 - 100.0 fL   MCH 29.7 26.0 - 34.0 pg   MCHC 33.2 30.0 - 36.0 g/dL   RDW 15.1 11.5 - 15.5 %   Platelets 187 150 - 400 K/uL   nRBC 0.0 0.0 - 0.2 %    Comment: Performed at [facility], [place] [address] [place], [place] [address]  Troponin I - Now Then Q6H     Status: Abnormal   Collection Time: 05/24/19  7:41 AM  Result Value Ref Range   Troponin I 0.05 (HH) <0.03 ng/mL    Comment: CRITICAL VALUE NOTED.  VALUE IS CONSISTENT WITH PREVIOUSLY REPORTED AND CALLED VALUE. Performed at [facility], [place] [address], [place], [place] [address]   Glucose, capillary  Status: Abnormal   Collection Time: 05/24/19  7:46 AM  Result Value Ref Range   Glucose-Capillary 122 (H) 70 - 99 mg/dL  Glucose, capillary     Status: None   Collection Time: 05/24/19 11:46 AM  Result Value Ref Range   Glucose-Capillary 92 70 - 99 mg/dL  Glucose, capillary     Status: Abnormal   Collection Time: 05/24/19  4:54 PM  Result Value Ref Range   Glucose-Capillary 133 (H) 70 - 99 mg/dL  Glucose, capillary     Status: Abnormal   Collection Time: 05/24/19  8:53 PM  Result Value Ref Range   Glucose-Capillary 121 (H) 70 - 99 mg/dL  Basic metabolic panel     Status: Abnormal    Collection Time: 05/25/19  5:19 AM  Result Value Ref Range   Sodium 131 (L) 135 - 145 mmol/L   Potassium 4.4 3.5 - 5.1 mmol/L   Chloride 95 (L) 98 - 111 mmol/L   CO2 27 22 - 32 mmol/L   Glucose, Bld 124 (H) 70 - 99 mg/dL   BUN 27 (H) 6 - 20 mg/dL   Creatinine, Ser 1.52 (H) 0.61 - 1.24 mg/dL   Calcium 8.8 (L) 8.9 - 10.3 mg/dL   GFR calc non Af Amer 49 (L) >60 mL/min   GFR calc Af Amer 57 (L) >60 mL/min   Anion gap 9 5 - 15    Comment: Performed at [facility], [place] [address], [place], [place] [address]  Glucose, capillary     Status: Abnormal   Collection Time: 05/25/19  7:39 AM  Result Value Ref Range   Glucose-Capillary 120 (H) 70 - 99 mg/dL  Glucose, capillary     Status: Abnormal   Collection Time: 05/25/19 12:04 PM  Result Value Ref Range   Glucose-Capillary 254 (H) 70 - 99 mg/dL    ECG   N/A  Telemetry   Sinus rhythm - Personally Reviewed  Radiology    Dg Chest Port 1 View  Result Date: 05/23/2019 CLINICAL DATA:  Shortness of breath EXAM: PORTABLE CHEST 1 VIEW COMPARISON:  04/16/2019 FINDINGS: Cardiomegaly. Pulmonary venous hypertension with early interstitial edema. No infiltrate, collapse or effusion. No acute bone finding. IMPRESSION: Congestive heart failure.  Cardiomegaly and interstitial edema. Electronically Signed   By: Nelson Chimes M.D.   On: 05/23/2019 16:46    Cardiac Studies   N/A  Assessment   1. Active Problems: 2.   History of drug abuse ([place]) 3.   GERD (gastroesophageal reflux disease) 4.   Chronic hepatitis C with cirrhosis ([place]) 5.   Essential hypertension 6.   Type 2 diabetes mellitus with complication, without long-term current use of insulin (HCC) 7.   CKD (chronic kidney disease) stage 3, GFR 30-59 ml/min (HCC) 8.   Acute on chronic combined systolic and diastolic heart failure ([place]) 9.   Atrial flutter ([place]) 10.   Cirrhosis ([place]) 11.   Chronic hyponatremia 12.   Alcohol abuse 13.   Elevated troponin 14.   Tobacco  abuse 15.   CHF exacerbation ([place]) 16.   Plan   1. Aggressive diuresis - creatinine climbing, will plan to hold off on lasix tonight -may transition back to po lasix tomorrow. Not sure what to make of his chest pain - did not respond to pain medication and GI cocktail - given low BP will hold off on lasix tonight and re-assess in the am tomorrow.  Time Spent Directly with Patient:  I have spent a  total of 25 minutes with the patient reviewing hospital notes, telemetry, EKGs, labs and examining the patient as well as establishing an assessment and plan that was discussed personally with the patient.  > 50% of time was spent in direct patient care.  Length of Stay:  LOS: 1 day   [name], MD, [facility], [place] Director of the Advanced Lipid Disorders &  Cardiovascular Risk Reduction Clinic Diplomate of the American Board of Clinical Lipidology Attending Cardiologist  Direct Dial: [phone]  Fax: [phone]  Website:  [email or web address] 05/25/2019, 1:41 PM

## 2019-05-25 NOTE — Discharge Instructions (Signed)

## 2019-05-25 NOTE — Progress Notes (Signed)
Patient made Cardiology MD aware during his rounds that he chest pain had become a "different kind of pain." Patient reported the pain to be 9/10 mid-sternal pain that was squeezing in nature. Patient has had mid-sternal chest pain the past several days and reported that he has had this pain before. MD's were aware. Previously the patient's chest pain had been relieved by taking 2 Vicodin tablets. Patient reported to Cardilogist that the pain had gotten worse after he ate lunch today. MD ordered Maalox as it was suspected that the pain may have been related to reflux. Maalox and 2 Vicodin tablets given at 1426. Cardiologist came back to check on the patient and to evaluate EKG. Patient reported that his pain was now a 7/10 but still having chest pain. Patient appeared to be having some anxiety and so after discussion with the MD, Ativan 1mg  PO was given, this also did not relieve the patient's chest pain. Cycle Troponins had been ordered, first Troponin was 0.03 and second one 4hours later was 0.04. Hospitalist notified of elevated Troponin levels and continued chest pain. Orders received to give SL Nitroglycerin. BP prior to administration was 112/73 HR 67 gave 1 SL Nitroglycerin tablet at 1837, after 5 minutes BP was 93/66 HR 67 and chest pain had decreased to a 4/10. MD made aware of drop in BP as Cardiology had been concerned about Nitroglycerin dropping BP too low considering it was already soft during their rounds 104/73. MD replied to continue to cycle Troponins and monitor the patient. During bedside shift change, the patient reported that his pain was going back up and that it was 6/10. Night RN given a full report of the days events and of plan of care. Patient was able to eat dinner and a snack afterwards and carry on a lengthy conversation with the RN and did not appear to be in distress and did not show visual signs of severe pain throughout the afternoon after he reported the change in his chest pain  to the MD.

## 2019-05-25 NOTE — Progress Notes (Signed)
PROGRESS NOTE    Brad [name]  KZL:[id number] DOB: Dec 27, 1960 DOA: 05/23/2019 PCP: Charlott Rakes, MD   Brief Narrative: Patient is a 58 year old male with history of combined systolic and diastolic CHF with ejection fraction of 20%, cardiomyopathy suspected to be secondary to alcohol/cocaine abuse, atrial flutter, cirrhosis, hepatitis C, diabetes type 2, CKD stage III, hyperlipidemia, tobacco abuse, chronic alcohol abuse, hypertension who presented to the emergency department shortness of breath.  He was admitted for the management of CHF exacerbation.  Started on IV diuresis.  Cardiology consulted and following.  Assessment & Plan:   Active Problems:   History of drug abuse (HCC)   GERD (gastroesophageal reflux disease)   Chronic hepatitis C with cirrhosis ([place])   Essential hypertension   Type 2 diabetes mellitus with complication, without long-term current use of insulin (HCC)   CKD (chronic kidney disease) stage 3, GFR 30-59 ml/min (HCC)   Acute on chronic combined systolic and diastolic heart failure (HCC)   Atrial flutter (HCC)   Cirrhosis (HCC)   Chronic hyponatremia   Alcohol abuse   Elevated troponin   Tobacco abuse   CHF exacerbation (HCC)  Acute on chronic combined systolic/diastolic heart failure: Secondary to noncompliance, cocaine abuse, alcohol abuse.  Mild elevated troponin secondary to CHF.  Continue diuresis with IV lasix.  Restrict fluid intake to less than 1.2 L a day. Cardiology following.Started on ARB.Continue spironolactone.He was a supposed to be on torsemide,hydralazine and Imdur at home.  Atrial flutter:CHA2DS2 vas score 4.  Continue Eliquis.  Currently rate is controlled.  Continue Coreg.  Hepatitis C with cirrhosis: Viral load pending.  Needs to follow-up as an outpatient for treatment.  CKD stage III: Currently kidney function on baseline.  Hyponatremia: Most likely hypervolemic hyponatremia secondary to CHF.  Continue diuresis.Improving.   Chronic alcohol abuse: Initiated CIWA protocol.  Diabetes mellitus: Continue sliding scale insulin.HbA1C of 7.3  Suspected hematochezia: Minimal.  Not bleeding at present.  Hemoglobin stable.  No need of further evaluation.  Substance abuse: Uses cocaine.  Counseled for cessation  Smoker: Smokes half pack a day.  Counseled for cessation.  Continue nicotine patch  Homelessness: Education officer, museum consulted.        DVT prophylaxis: Eliquis Code Status: Full Family Communication: None present at the bedside Disposition Plan: Back to the previous environment after stabilization of CHF status,cardiology clearance   Consultants: Cardiology  Procedures: None  Antimicrobials:  Anti-infectives (From admission, onward)   None      Subjective:  Patient seen and examined the bedside this morning.  Hemodynamically stable.  Lungs are clear on auscultation, no edema on the legs.  Complains of midsternal chest pain this morning which is reproducible.   Objective: Vitals:   05/24/19 1500 05/24/19 2037 05/25/19 0502 05/25/19 1422  BP: 115/75 111/79 114/75 104/73  Pulse: 67 67 72 71  Resp: 18 18 18 17   Temp: 98.7 F (37.1 C) 98.1 F (36.7 C) 98.3 F (36.8 C) 97.6 F (36.4 C)  TempSrc: Oral Oral Oral Oral  SpO2: 100% 100% 100% 98%  Weight:   73.4 kg   Height:        Intake/Output Summary (Last 24 hours) at 05/25/2019 1430 Last data filed at 05/25/2019 1429 Gross per 24 hour  Intake 800 ml  Output 2450 ml  Net -1650 ml   Filed Weights   05/23/19 1600 05/24/19 0436 05/25/19 0502  Weight: 81.2 kg 72.6 kg 73.4 kg    Examination:  General exam: Appears calm and comfortable ,Not  in distress,average built HEENT:PERRL,Oral mucosa moist, Ear/Nose normal on gross exam Respiratory system: Bilateral equal air entry, normal vesicular breath sounds, no wheezes or crackles  Cardiovascular system: S1 & S2 heard, RRR. No JVD, murmurs, rubs, gallops or clicks. Gastrointestinal system:  Abdomen is nondistended, soft and nontender. No organomegaly or masses felt. Normal bowel sounds heard. Central nervous system: Alert and oriented. No focal neurological deficits. Extremities: No edema, no clubbing ,no cyanosis, distal peripheral pulses palpable. Skin: No rashes, lesions or ulcers,no icterus ,no pallor MSK: Normal muscle bulk,tone ,power Psychiatry: Judgement and insight appear normal. Mood & affect appropriate.   Data Reviewed: I have personally reviewed following labs and imaging studies  CBC: Recent Labs  Lab 05/23/19 1636 05/24/19 0216  WBC 5.5 5.4  HGB 14.0 13.0  HCT 40.9 39.2  MCV 88.3 89.5  PLT 196 115   Basic Metabolic Panel: Recent Labs  Lab 05/23/19 1636 05/24/19 0216 05/25/19 0519  NA 127* 128* 131*  K 4.3 4.3 4.4  CL 97* 97* 95*  CO2 21* 21* 27  GLUCOSE 158* 117* 124*  BUN 13 18 27*  CREATININE 0.97 1.29* 1.52*  CALCIUM 8.7* 8.7* 8.8*  MG  --  2.0  --   PHOS  --  3.2  --    GFR: Estimated Creatinine Clearance: 54.3 mL/min (A) (by C-G formula based on SCr of 1.52 mg/dL (H)). Liver Function Tests: Recent Labs  Lab 05/23/19 1636 05/24/19 0216  AST 32 36  ALT 27 28  ALKPHOS 81 84  BILITOT 1.9* 1.9*  PROT 8.5* 8.0  ALBUMIN 3.6 3.4*   Recent Labs  Lab 05/23/19 1636  LIPASE 25   No results for input(s): AMMONIA in the last 168 hours. Coagulation Profile: Recent Labs  Lab 05/23/19 1636  INR 1.2   Cardiac Enzymes: Recent Labs  Lab 05/23/19 1636 05/23/19 1937 05/24/19 0216 05/24/19 0741  TROPONINI 0.06* 0.05* 0.05* 0.05*   BNP (last 3 results) No results for input(s): PROBNP in the last 8760 hours. HbA1C: Recent Labs    05/24/19 0216  HGBA1C 7.3*   CBG: Recent Labs  Lab 05/24/19 1146 05/24/19 1654 05/24/19 2053 05/25/19 0739 05/25/19 1204  GLUCAP 92 133* 121* 120* 254*   Lipid Profile: No results for input(s): CHOL, HDL, LDLCALC, TRIG, CHOLHDL, LDLDIRECT in the last 72 hours. Thyroid Function Tests: Recent  Labs    05/24/19 0216  TSH 3.378   Anemia Panel: No results for input(s): VITAMINB12, FOLATE, FERRITIN, TIBC, IRON, RETICCTPCT in the last 72 hours. Sepsis Labs: No results for input(s): PROCALCITON, LATICACIDVEN in the last 168 hours.  Recent Results (from the past 240 hour(s))  SARS Coronavirus 2 (CEPHEID - Performed in [place] hospital lab), Hosp Order     Status: None   Collection Time: 05/23/19  6:34 PM  Result Value Ref Range Status   SARS Coronavirus 2 NEGATIVE NEGATIVE Final    Comment: (NOTE) If result is NEGATIVE SARS-CoV-2 target nucleic acids are NOT DETECTED. The SARS-CoV-2 RNA is generally detectable in upper and lower  respiratory specimens during the acute phase of infection. The lowest  concentration of SARS-CoV-2 viral copies this assay can detect is 250  copies / mL. A negative result does not preclude SARS-CoV-2 infection  and should not be used as the sole basis for treatment or other  patient management decisions.  A negative result may occur with  improper specimen collection / handling, submission of specimen other  than nasopharyngeal swab, presence of viral mutation(s) within  the  areas targeted by this assay, and inadequate number of viral copies  (<250 copies / mL). A negative result must be combined with clinical  observations, patient history, and epidemiological information. If result is POSITIVE SARS-CoV-2 target nucleic acids are DETECTED. The SARS-CoV-2 RNA is generally detectable in upper and lower  respiratory specimens dur ing the acute phase of infection.  Positive  results are indicative of active infection with SARS-CoV-2.  Clinical  correlation with patient history and other diagnostic information is  necessary to determine patient infection status.  Positive results do  not rule out bacterial infection or co-infection with other viruses. If result is PRESUMPTIVE POSTIVE SARS-CoV-2 nucleic acids MAY BE PRESENT.   A presumptive  positive result was obtained on the submitted specimen  and confirmed on repeat testing.  While 2019 novel coronavirus  (SARS-CoV-2) nucleic acids may be present in the submitted sample  additional confirmatory testing may be necessary for epidemiological  and / or clinical management purposes  to differentiate between  SARS-CoV-2 and other Sarbecovirus currently known to infect humans.  If clinically indicated additional testing with an alternate test  methodology [phone]) is advised. The SARS-CoV-2 RNA is generally  detectable in upper and lower respiratory sp ecimens during the acute  phase of infection. The expected result is Negative. Fact Sheet for Patients:  StrictlyIdeas.no Fact Sheet for Healthcare Providers: BankingDealers.co.za This test is not yet approved or cleared by the [place] FDA and has been authorized for detection and/or diagnosis of SARS-CoV-2 by FDA under an Emergency Use Authorization (EUA).  This EUA will remain in effect (meaning this test can be used) for the duration of the COVID-19 declaration under Section 564(b)(1) of the Act, 21 U.S.C. section 360bbb-3(b)(1), unless the authorization is terminated or revoked sooner. Performed at [facility], [place] [address] [place], [place] [address]          Radiology Studies: Dg Chest Port 1 View  Result Date: 05/23/2019 CLINICAL DATA:  Shortness of breath EXAM: PORTABLE CHEST 1 VIEW COMPARISON:  04/16/2019 FINDINGS: Cardiomegaly. Pulmonary venous hypertension with early interstitial edema. No infiltrate, collapse or effusion. No acute bone finding. IMPRESSION: Congestive heart failure.  Cardiomegaly and interstitial edema. Electronically Signed   By: Nelson Chimes M.D.   On: 05/23/2019 16:46        Scheduled Meds: . apixaban  5 mg Oral BID  . atorvastatin  40 mg Oral Daily  . carvedilol  3.125 mg Oral BID WC  . folic acid  1 mg Oral  Daily  . furosemide  60 mg Intravenous Q12H  . gabapentin  600 mg Oral BID  . hydrALAZINE  37.5 mg Oral TID  . insulin aspart  0-5 Units Subcutaneous QHS  . insulin aspart  0-9 Units Subcutaneous TID WC  . isosorbide mononitrate  30 mg Oral Daily  . loratadine  10 mg Oral Daily  . losartan  25 mg Oral Daily  . multivitamin with minerals  1 tablet Oral Daily  . nicotine  21 mg Transdermal Daily  . sodium chloride flush  3 mL Intravenous Q12H  . spironolactone  25 mg Oral Daily  . thiamine  100 mg Oral Daily   Or  . thiamine  100 mg Intravenous Daily   Continuous Infusions: . sodium chloride       LOS: 1 day    Time spent:35 mins. More than 50% of that time was spent in counseling and/or coordination of care.  Shelly Coss, MD [facility] Pager [phone]  If 7PM-7AM, please contact night-coverage [email or web address] Password [email or web address] 05/25/2019, 2:30 PM

## 2019-05-26 LAB — BASIC METABOLIC PANEL
Anion gap: 8 (ref 5–15)
BUN: 30 mg/dL — ABNORMAL HIGH (ref 6–20)
CO2: 25 mmol/L (ref 22–32)
Calcium: 8.5 mg/dL — ABNORMAL LOW (ref 8.9–10.3)
Chloride: 99 mmol/L (ref 98–111)
Creatinine, Ser: 1.58 mg/dL — ABNORMAL HIGH (ref 0.61–1.24)
GFR calc Af Amer: 55 mL/min — ABNORMAL LOW (ref >60)
GFR calc non Af Amer: 47 mL/min — ABNORMAL LOW (ref >60)
Glucose, Bld: 138 mg/dL — ABNORMAL HIGH (ref 70–99)
Potassium: 4.2 mmol/L (ref 3.5–5.1)
Sodium: 132 mmol/L — ABNORMAL LOW (ref 135–145)

## 2019-05-26 LAB — GLUCOSE, CAPILLARY
Glucose-Capillary: 150 mg/dL — ABNORMAL HIGH (ref 70–99)
Glucose-Capillary: 104 mg/dL — ABNORMAL HIGH (ref 70–99)
Glucose-Capillary: 147 mg/dL — ABNORMAL HIGH (ref 70–99)
Glucose-Capillary: 174 mg/dL — ABNORMAL HIGH (ref 70–99)

## 2019-05-26 LAB — TROPONIN I
Troponin I: 0.03 ng/mL (ref <0.03)
Troponin I: 0.04 ng/mL (ref <0.03)

## 2019-05-26 MED ORDER — HYDRALAZINE HCL 25 MG PO TABS
25.0000 mg | ORAL_TABLET | Freq: Two times a day (BID) | ORAL | Status: DC
  Administered 2019-05-26 – 2019-05-28 (×4): 25 mg via ORAL
  Filled 2019-05-26 (×4): qty 1

## 2019-05-26 MED ORDER — IBUPROFEN 200 MG PO TABS
600.0000 mg | ORAL_TABLET | Freq: Four times a day (QID) | ORAL | Status: DC | PRN
  Administered 2019-05-26 – 2019-05-27 (×2): 600 mg via ORAL
  Filled 2019-05-26 (×2): qty 3

## 2019-05-26 MED ORDER — ISOSORBIDE MONONITRATE ER 60 MG PO TB24
60.0000 mg | ORAL_TABLET | Freq: Every day | ORAL | Status: DC
  Administered 2019-05-27 – 2019-05-28 (×2): 60 mg via ORAL
  Filled 2019-05-26 (×2): qty 1

## 2019-05-26 NOTE — Progress Notes (Signed)
I agree with the previous RN's assessment. 

## 2019-05-26 NOTE — Progress Notes (Signed)
DAILY PROGRESS NOTE   Patient Name: Brad [name] Date of Encounter: 05/26/2019 Cardiologist: [name], MD  Chief Complaint   Chest pain  Patient Profile   58 yo male with NICM, PAF, chest pain and mildly elevated troponin, presents with shortness of breath and medication non-adherence in the context of cocaine use  Subjective   Chest pain noted yesterday -eventually got nitro with some improvement in his symptoms. May have benefited as a venodilator, but not suspicious for unstable angina - troponin mildly elevated but flat. ?esophageal dysmotility. BP now soft. On imdur 30 mg daily, hydralazine 37.5 mg TID and coreg 3.125 mg BID. Creatinine has stabilized.   Objective   Vitals:   05/25/19 1845 05/25/19 1915 05/25/19 2106 05/26/19 0546  BP: 93/66 108/72 113/80 (!) 98/59  Pulse: 67   (!) 18  Resp:   18 18  Temp:   97.6 F (36.4 C) 97.8 F (36.6 C)  TempSrc:   Oral Oral  SpO2:   100% 98%  Weight:    75.2 kg  Height:        Intake/Output Summary (Last 24 hours) at 05/26/2019 1129 Last data filed at 05/26/2019 0547 Gross per 24 hour  Intake 560 ml  Output 1150 ml  Net -590 ml   Filed Weights   05/24/19 0436 05/25/19 0502 05/26/19 0546  Weight: 72.6 kg 73.4 kg 75.2 kg    Physical Exam   General appearance: alert and no distress Lungs: clear to auscultation bilaterally Heart: regular rate and rhythm Extremities: extremities normal, atraumatic, no cyanosis or edema Skin: Skin color, texture, turgor normal. No rashes or lesions Neurologic: Grossly normal  Inpatient Medications    Scheduled Meds: . apixaban  5 mg Oral BID  . atorvastatin  40 mg Oral Daily  . carvedilol  3.125 mg Oral BID WC  . folic acid  1 mg Oral Daily  . gabapentin  600 mg Oral BID  . hydrALAZINE  37.5 mg Oral TID  . insulin aspart  0-5 Units Subcutaneous QHS  . insulin aspart  0-9 Units Subcutaneous TID WC  . isosorbide mononitrate  30 mg Oral Daily  . loratadine  10 mg Oral Daily   . losartan  25 mg Oral Daily  . multivitamin with minerals  1 tablet Oral Daily  . nicotine  21 mg Transdermal Daily  . pantoprazole  40 mg Oral Daily  . sodium chloride flush  3 mL Intravenous Q12H  . spironolactone  25 mg Oral Daily  . thiamine  100 mg Oral Daily   Or  . thiamine  100 mg Intravenous Daily    Continuous Infusions: . sodium chloride      PRN Meds: sodium chloride, acetaminophen **OR** acetaminophen, HYDROcodone-acetaminophen, ibuprofen, levalbuterol, LORazepam **OR** LORazepam, morphine injection, nitroGLYCERIN, ondansetron **OR** ondansetron (ZOFRAN) IV, sodium chloride flush   Labs   Results for orders placed or performed during the hospital encounter of 05/23/19 (from the past 48 hour(s))  Glucose, capillary     Status: None   Collection Time: 05/24/19 11:46 AM  Result Value Ref Range   Glucose-Capillary 92 70 - 99 mg/dL  Glucose, capillary     Status: Abnormal   Collection Time: 05/24/19  4:54 PM  Result Value Ref Range   Glucose-Capillary 133 (H) 70 - 99 mg/dL  Glucose, capillary     Status: Abnormal   Collection Time: 05/24/19  8:53 PM  Result Value Ref Range   Glucose-Capillary 121 (H) 70 - 99 mg/dL  Basic metabolic panel     Status: Abnormal   Collection Time: 05/25/19  5:19 AM  Result Value Ref Range   Sodium 131 (L) 135 - 145 mmol/L   Potassium 4.4 3.5 - 5.1 mmol/L   Chloride 95 (L) 98 - 111 mmol/L   CO2 27 22 - 32 mmol/L   Glucose, Bld 124 (H) 70 - 99 mg/dL   BUN 27 (H) 6 - 20 mg/dL   Creatinine, Ser 1.52 (H) 0.61 - 1.24 mg/dL   Calcium 8.8 (L) 8.9 - 10.3 mg/dL   GFR calc non Af Amer 49 (L) >60 mL/min   GFR calc Af Amer 57 (L) >60 mL/min   Anion gap 9 5 - 15    Comment: Performed at [facility], [place] [address], [place], [place] [address]  Glucose, capillary     Status: Abnormal   Collection Time: 05/25/19  7:39 AM  Result Value Ref Range   Glucose-Capillary 120 (H) 70 - 99 mg/dL  Glucose, capillary     Status:  Abnormal   Collection Time: 05/25/19 12:04 PM  Result Value Ref Range   Glucose-Capillary 254 (H) 70 - 99 mg/dL  Troponin I - ONCE - STAT     Status: Abnormal   Collection Time: 05/25/19  2:37 PM  Result Value Ref Range   Troponin I 0.03 (HH) <0.03 ng/mL    Comment: CRITICAL VALUE NOTED.  VALUE IS CONSISTENT WITH PREVIOUSLY REPORTED AND CALLED VALUE. Performed at [facility], [place] [address] [place], [place] [address]   Glucose, capillary     Status: None   Collection Time: 05/25/19  4:00 PM  Result Value Ref Range   Glucose-Capillary 80 70 - 99 mg/dL  Troponin I - Now Then Q6H     Status: Abnormal   Collection Time: 05/25/19  5:40 PM  Result Value Ref Range   Troponin I 0.04 (HH) <0.03 ng/mL    Comment: CRITICAL RESULT CALLED TO, READ BACK BY AND VERIFIED WITH: Elisha Ponder [id number] @ 2440 BY J SCOTTON Performed at [facility] [address] [place], [place] [address]   Glucose, capillary     Status: Abnormal   Collection Time: 05/25/19  9:04 PM  Result Value Ref Range   Glucose-Capillary 177 (H) 70 - 99 mg/dL  Basic metabolic panel     Status: Abnormal   Collection Time: 05/26/19 12:21 AM  Result Value Ref Range   Sodium 132 (L) 135 - 145 mmol/L   Potassium 4.2 3.5 - 5.1 mmol/L   Chloride 99 98 - 111 mmol/L   CO2 25 22 - 32 mmol/L   Glucose, Bld 138 (H) 70 - 99 mg/dL   BUN 30 (H) 6 - 20 mg/dL   Creatinine, Ser 1.58 (H) 0.61 - 1.24 mg/dL   Calcium 8.5 (L) 8.9 - 10.3 mg/dL   GFR calc non Af Amer 47 (L) >60 mL/min   GFR calc Af Amer 55 (L) >60 mL/min   Anion gap 8 5 - 15    Comment: Performed at [facility], [place] [address] [place],  [address]  Troponin I - Now Then Q6H     Status: Abnormal   Collection Time: 05/26/19 12:21 AM  Result Value Ref Range   Troponin I 0.03 (HH) <0.03 ng/mL    Comment: CRITICAL VALUE NOTED.  VALUE IS CONSISTENT WITH PREVIOUSLY REPORTED AND CALLED VALUE. Performed at  [facility], [place] [address] [place], [place]  [address]   Troponin I - Now Then Q6H     Status: Abnormal   Collection Time: 05/26/19  5:47 AM  Result Value Ref Range   Troponin I 0.04 (HH) <0.03 ng/mL    Comment: CRITICAL VALUE NOTED.  VALUE IS CONSISTENT WITH PREVIOUSLY REPORTED AND CALLED VALUE. Performed at [facility], [place] [address] [place], [place] [address]   Glucose, capillary     Status: Abnormal   Collection Time: 05/26/19  8:00 AM  Result Value Ref Range   Glucose-Capillary 150 (H) 70 - 99 mg/dL    ECG   N/A  Telemetry   Sinus rhythm - Personally Reviewed  Radiology    No results found.  Cardiac Studies   N/A  Assessment   Active Problems:   History of drug abuse (HCC)   GERD (gastroesophageal reflux disease)   Chronic hepatitis C with cirrhosis (HCC)   Essential hypertension   Type 2 diabetes mellitus with complication, without long-term current use of insulin (HCC)   CKD (chronic kidney disease) stage 3, GFR 30-59 ml/min (HCC)   Acute on chronic combined systolic and diastolic heart failure (HCC)   Atrial flutter (HCC)   Cirrhosis (HCC)   Chronic hyponatremia   Alcohol abuse   Elevated troponin   Tobacco abuse   CHF exacerbation ([place])   Plan   Hold diuretic today - decrease hydralazine to 25 mg BID. Increase imdur to 60 mg daily tomorrow. May resume home oral diuretic tomorrow.  Time Spent Directly with Patient:  I have spent a total of 25 minutes with the patient reviewing hospital notes, telemetry, EKGs, labs and examining the patient as well as establishing an assessment and plan that was discussed personally with the patient.  > 50% of time was spent in direct patient care.  Length of Stay:  LOS: 2 days   [name], MD, [facility], [place] Director of the Advanced Lipid Disorders &  Cardiovascular Risk Reduction Clinic Diplomate of the American Board of  Clinical Lipidology Attending Cardiologist  Direct Dial: [phone]  Fax: [phone]  Website:  [email or web address] 05/26/2019, 11:29 AM

## 2019-05-26 NOTE — Progress Notes (Signed)
Pt c/o of pain discomfort, medicated as ordered. States pain is 7-8/10 in the chest/abd area. Assessment unchanged. Continue plan of care. SRP, RN

## 2019-05-26 NOTE — Progress Notes (Signed)
PROGRESS NOTE    Brad [name]  KDX:[id number] DOB: September 23, 1961 DOA: 05/23/2019 PCP: Charlott Rakes, MD   Brief Narrative: Patient is a 58 year old male with history of combined systolic and diastolic CHF with ejection fraction of 20%, cardiomyopathy suspected to be secondary to alcohol/cocaine abuse, atrial flutter, cirrhosis, hepatitis C, diabetes type 2, CKD stage III, hyperlipidemia, tobacco abuse, chronic alcohol abuse, hypertension who presented to the emergency department shortness of breath.  He was admitted for the management of CHF exacerbation.  Started on IV diuresis.  Cardiology consulted and following.  Assessment & Plan:   Active Problems:   History of drug abuse (HCC)   GERD (gastroesophageal reflux disease)   Chronic hepatitis C with cirrhosis ([place])   Essential hypertension   Type 2 diabetes mellitus with complication, without long-term current use of insulin (HCC)   CKD (chronic kidney disease) stage 3, GFR 30-59 ml/min (HCC)   Acute on chronic combined systolic and diastolic heart failure (HCC)   Atrial flutter (HCC)   Cirrhosis (HCC)   Chronic hyponatremia   Alcohol abuse   Elevated troponin   Tobacco abuse   CHF exacerbation (HCC)  Acute on chronic combined systolic/diastolic heart failure: Secondary to noncompliance, cocaine abuse, alcohol abuse.  Mild elevated troponin secondary to CHF.    Restrict fluid intake to less than 1.2 L a day. Cardiology following.Continue spironolactone.He was a supposed to be on torsemide,hydralazine and Imdur at home. He might have reached euvolemic level now.  He is not volume overloaded.  Lungs are clear.  No peripheral edema. Continue Coreg, hydralazine, Imdur, spironolactone.  Aldactone discontinued due to increase in creatinine.  Chest pain: Atypical chest pain.  Reproducible on palpation on lower sternal area.  Will try NSAIDs for possible costochondritis.  Also continue Protonix for possible GERD, patient denies history of  GERD though.  Troponins mild elevated most likely associated  with CHF  Atrial flutter:CHA2DS2 vas score 4.  Continue Eliquis.  Currently rate is controlled.  Continue Coreg.  Hepatitis C with cirrhosis: Viral load pending.  Needs to follow-up as an outpatient for treatment.  CKD stage III: Creatinine today 1.58.Diuretics on hold.  Hyponatremia:Improving.  Chronic alcohol abuse: Not on withdrawal  Diabetes mellitus: Continue sliding scale insulin.HbA1C of 7.3  Suspected hematochezia: Minimal.  Not bleeding at present.  Hemoglobin stable.  No need of further evaluation.  Substance abuse: Uses cocaine.  Counseled for cessation  Smoker: Smokes half pack a day.  Counseled for cessation.  Continue nicotine patch  Homelessness: Education officer, museum consulted.        DVT prophylaxis: Eliquis Code Status: Full Family Communication: None present at the bedside Disposition Plan: Back to the previous environment after stabilization of CHF status,cardiology clearance   Consultants: Cardiology  Procedures: None  Antimicrobials:  Anti-infectives (From admission, onward)   None      Subjective:  Patient seen and examined the bedside this morning.  Still complains of lower sternal chest discomfort which he describes as squeezing.  Pain is reproducible on palpation.  Denies any shortness of breath.  Objective: Vitals:   05/25/19 1915 05/25/19 2106 05/26/19 0546 05/26/19 1323  BP: 108/72 113/80 (!) 98/59 114/75  Pulse:   (!) 18 78  Resp:  18 18 20   Temp:  97.6 F (36.4 C) 97.8 F (36.6 C) 97.8 F (36.6 C)  TempSrc:  Oral Oral Oral  SpO2:  100% 98% 100%  Weight:   75.2 kg   Height:        Intake/Output Summary (  Last 24 hours) at 05/26/2019 1349 Last data filed at 05/26/2019 1000 Gross per 24 hour  Intake 680 ml  Output 1150 ml  Net -470 ml   Filed Weights   05/24/19 0436 05/25/19 0502 05/26/19 0546  Weight: 72.6 kg 73.4 kg 75.2 kg    Examination:  General exam: Appears  calm and comfortable ,Not in distress,average built HEENT:PERRL,Oral mucosa moist, Ear/Nose normal on gross exam Respiratory system: Bilateral equal air entry, normal vesicular breath sounds, no wheezes or crackles  Cardiovascular system: S1 & S2 heard, RRR. No JVD, murmurs, rubs, gallops or clicks. Gastrointestinal system: Abdomen is nondistended, soft and nontender. No organomegaly or masses felt. Normal bowel sounds heard. Central nervous system: Alert and oriented. No focal neurological deficits. Extremities: No edema, no clubbing ,no cyanosis, distal peripheral pulses palpable. Skin: No rashes, lesions or ulcers,no icterus ,no pallor  Data Reviewed: I have personally reviewed following labs and imaging studies  CBC: Recent Labs  Lab 05/23/19 1636 05/24/19 0216  WBC 5.5 5.4  HGB 14.0 13.0  HCT 40.9 39.2  MCV 88.3 89.5  PLT 196 315   Basic Metabolic Panel: Recent Labs  Lab 05/23/19 1636 05/24/19 0216 05/25/19 0519 05/26/19 0021  NA 127* 128* 131* 132*  K 4.3 4.3 4.4 4.2  CL 97* 97* 95* 99  CO2 21* 21* 27 25  GLUCOSE 158* 117* 124* 138*  BUN 13 18 27* 30*  CREATININE 0.97 1.29* 1.52* 1.58*  CALCIUM 8.7* 8.7* 8.8* 8.5*  MG  --  2.0  --   --   PHOS  --  3.2  --   --    GFR: Estimated Creatinine Clearance: 53.5 mL/min (A) (by C-G formula based on SCr of 1.58 mg/dL (H)). Liver Function Tests: Recent Labs  Lab 05/23/19 1636 05/24/19 0216  AST 32 36  ALT 27 28  ALKPHOS 81 84  BILITOT 1.9* 1.9*  PROT 8.5* 8.0  ALBUMIN 3.6 3.4*   Recent Labs  Lab 05/23/19 1636  LIPASE 25   No results for input(s): AMMONIA in the last 168 hours. Coagulation Profile: Recent Labs  Lab 05/23/19 1636  INR 1.2   Cardiac Enzymes: Recent Labs  Lab 05/24/19 0741 05/25/19 1437 05/25/19 1740 05/26/19 0021 05/26/19 0547  TROPONINI 0.05* 0.03* 0.04* 0.03* 0.04*   BNP (last 3 results) No results for input(s): PROBNP in the last 8760 hours. HbA1C: Recent Labs    05/24/19  0216  HGBA1C 7.3*   CBG: Recent Labs  Lab 05/25/19 1204 05/25/19 1600 05/25/19 2104 05/26/19 0800 05/26/19 1133  GLUCAP 254* 80 177* 150* 104*   Lipid Profile: No results for input(s): CHOL, HDL, LDLCALC, TRIG, CHOLHDL, LDLDIRECT in the last 72 hours. Thyroid Function Tests: Recent Labs    05/24/19 0216  TSH 3.378   Anemia Panel: No results for input(s): VITAMINB12, FOLATE, FERRITIN, TIBC, IRON, RETICCTPCT in the last 72 hours. Sepsis Labs: No results for input(s): PROCALCITON, LATICACIDVEN in the last 168 hours.  Recent Results (from the past 240 hour(s))  SARS Coronavirus 2 (CEPHEID - Performed in [place] hospital lab), Hosp Order     Status: None   Collection Time: 05/23/19  6:34 PM  Result Value Ref Range Status   SARS Coronavirus 2 NEGATIVE NEGATIVE Final    Comment: (NOTE) If result is NEGATIVE SARS-CoV-2 target nucleic acids are NOT DETECTED. The SARS-CoV-2 RNA is generally detectable in upper and lower  respiratory specimens during the acute phase of infection. The lowest  concentration of SARS-CoV-2 viral  copies this assay can detect is 250  copies / mL. A negative result does not preclude SARS-CoV-2 infection  and should not be used as the sole basis for treatment or other  patient management decisions.  A negative result may occur with  improper specimen collection / handling, submission of specimen other  than nasopharyngeal swab, presence of viral mutation(s) within the  areas targeted by this assay, and inadequate number of viral copies  (<250 copies / mL). A negative result must be combined with clinical  observations, patient history, and epidemiological information. If result is POSITIVE SARS-CoV-2 target nucleic acids are DETECTED. The SARS-CoV-2 RNA is generally detectable in upper and lower  respiratory specimens dur ing the acute phase of infection.  Positive  results are indicative of active infection with SARS-CoV-2.  Clinical   correlation with patient history and other diagnostic information is  necessary to determine patient infection status.  Positive results do  not rule out bacterial infection or co-infection with other viruses. If result is PRESUMPTIVE POSTIVE SARS-CoV-2 nucleic acids MAY BE PRESENT.   A presumptive positive result was obtained on the submitted specimen  and confirmed on repeat testing.  While 2019 novel coronavirus  (SARS-CoV-2) nucleic acids may be present in the submitted sample  additional confirmatory testing may be necessary for epidemiological  and / or clinical management purposes  to differentiate between  SARS-CoV-2 and other Sarbecovirus currently known to infect humans.  If clinically indicated additional testing with an alternate test  methodology [phone]) is advised. The SARS-CoV-2 RNA is generally  detectable in upper and lower respiratory sp ecimens during the acute  phase of infection. The expected result is Negative. Fact Sheet for Patients:  StrictlyIdeas.no Fact Sheet for Healthcare Providers: BankingDealers.co.za This test is not yet approved or cleared by the [place] FDA and has been authorized for detection and/or diagnosis of SARS-CoV-2 by FDA under an Emergency Use Authorization (EUA).  This EUA will remain in effect (meaning this test can be used) for the duration of the COVID-19 declaration under Section 564(b)(1) of the Act, 21 U.S.C. section 360bbb-3(b)(1), unless the authorization is terminated or revoked sooner. Performed at [facility], [place] [address], [place], [place] [address]          Radiology Studies: No results found.      Scheduled Meds: . apixaban  5 mg Oral BID  . atorvastatin  40 mg Oral Daily  . carvedilol  3.125 mg Oral BID WC  . folic acid  1 mg Oral Daily  . gabapentin  600 mg Oral BID  . hydrALAZINE  25 mg Oral BID  . insulin aspart  0-5 Units  Subcutaneous QHS  . insulin aspart  0-9 Units Subcutaneous TID WC  . [START ON 05/27/2019] isosorbide mononitrate  60 mg Oral Daily  . loratadine  10 mg Oral Daily  . multivitamin with minerals  1 tablet Oral Daily  . nicotine  21 mg Transdermal Daily  . pantoprazole  40 mg Oral Daily  . sodium chloride flush  3 mL Intravenous Q12H  . spironolactone  25 mg Oral Daily  . thiamine  100 mg Oral Daily   Or  . thiamine  100 mg Intravenous Daily   Continuous Infusions: . sodium chloride       LOS: 2 days    Time spent:35 mins. More than 50% of that time was spent in counseling and/or coordination of care.      Shelly Coss, MD [facility] Pager [phone]  If 7PM-7AM, please contact night-coverage [email or web address] Password [email or web address] 05/26/2019, 1:49 PM

## 2019-05-27 ENCOUNTER — Other Ambulatory Visit: Payer: Self-pay | Admitting: Family Medicine

## 2019-05-27 ENCOUNTER — Inpatient Hospital Stay (HOSPITAL_COMMUNITY): Payer: Medicaid Other

## 2019-05-27 ENCOUNTER — Telehealth: Payer: Self-pay | Admitting: Family Medicine

## 2019-05-27 DIAGNOSIS — I509 Heart failure, unspecified: Secondary | ICD-10-CM

## 2019-05-27 DIAGNOSIS — K7460 Unspecified cirrhosis of liver: Secondary | ICD-10-CM

## 2019-05-27 DIAGNOSIS — I361 Nonrheumatic tricuspid (valve) insufficiency: Secondary | ICD-10-CM

## 2019-05-27 DIAGNOSIS — I340 Nonrheumatic mitral (valve) insufficiency: Secondary | ICD-10-CM

## 2019-05-27 DIAGNOSIS — B182 Chronic viral hepatitis C: Secondary | ICD-10-CM

## 2019-05-27 DIAGNOSIS — R071 Chest pain on breathing: Secondary | ICD-10-CM

## 2019-05-27 LAB — GLUCOSE, CAPILLARY
Glucose-Capillary: 156 mg/dL — ABNORMAL HIGH (ref 70–99)
Glucose-Capillary: 131 mg/dL — ABNORMAL HIGH (ref 70–99)
Glucose-Capillary: 143 mg/dL — ABNORMAL HIGH (ref 70–99)
Glucose-Capillary: 139 mg/dL — ABNORMAL HIGH (ref 70–99)

## 2019-05-27 LAB — BASIC METABOLIC PANEL
Anion gap: 7 (ref 5–15)
BUN: 32 mg/dL — ABNORMAL HIGH (ref 6–20)
CO2: 23 mmol/L (ref 22–32)
Calcium: 8.4 mg/dL — ABNORMAL LOW (ref 8.9–10.3)
Chloride: 103 mmol/L (ref 98–111)
Creatinine, Ser: 1.77 mg/dL — ABNORMAL HIGH (ref 0.61–1.24)
GFR calc Af Amer: 48 mL/min — ABNORMAL LOW (ref >60)
GFR calc non Af Amer: 41 mL/min — ABNORMAL LOW (ref >60)
Glucose, Bld: 132 mg/dL — ABNORMAL HIGH (ref 70–99)
Potassium: 4.4 mmol/L (ref 3.5–5.1)
Sodium: 133 mmol/L — ABNORMAL LOW (ref 135–145)

## 2019-05-27 LAB — ECHOCARDIOGRAM COMPLETE
Height: 71 in
Weight: 2740.76 oz

## 2019-05-27 LAB — CBC
HCT: 35.8 % — ABNORMAL LOW (ref 39.0–52.0)
Hemoglobin: 11.8 g/dL — ABNORMAL LOW (ref 13.0–17.0)
MCH: 30.8 pg (ref 26.0–34.0)
MCHC: 33 g/dL (ref 30.0–36.0)
MCV: 93.5 fL (ref 80.0–100.0)
Platelets: 139 10*3/uL — ABNORMAL LOW (ref 150–400)
RBC: 3.83 MIL/uL — ABNORMAL LOW (ref 4.22–5.81)
RDW: 15.7 % — ABNORMAL HIGH (ref 11.5–15.5)
WBC: 4 10*3/uL (ref 4.0–10.5)
nRBC: 0 % (ref 0.0–0.2)

## 2019-05-27 LAB — HCV RNA QUANT
HCV Quantitative Log: 5.782 log10 IU/mL (ref >1.70)
HCV Quantitative: 606000 IU/mL (ref >50)

## 2019-05-27 MED ORDER — POLYETHYLENE GLYCOL 3350 17 G PO PACK
17.0000 g | PACK | Freq: Every day | ORAL | Status: DC
  Administered 2019-05-27 – 2019-05-28 (×2): 17 g via ORAL
  Filled 2019-05-27 (×2): qty 1

## 2019-05-27 MED ORDER — GABAPENTIN 300 MG PO CAPS
600.0000 mg | ORAL_CAPSULE | Freq: Three times a day (TID) | ORAL | Status: DC
  Administered 2019-05-27 – 2019-05-28 (×3): 600 mg via ORAL
  Filled 2019-05-27 (×3): qty 2

## 2019-05-27 NOTE — Progress Notes (Signed)
PROGRESS NOTE    Brad [name]  OHY:[id number] DOB: 1961-01-01 DOA: 05/23/2019 PCP: Charlott Rakes, MD   Brief Narrative: Patient is a 58 year old male with history of combined systolic and diastolic CHF with ejection fraction of 20%, cardiomyopathy suspected to be secondary to alcohol/cocaine abuse, atrial flutter, cirrhosis, hepatitis C, diabetes type 2, CKD stage III, hyperlipidemia, tobacco abuse, chronic alcohol abuse, hypertension who presented to the emergency department shortness of breath.  He was admitted for the management of CHF exacerbation.  Started on IV diuresis.  Cardiology consulted and following.  Hospital course remarkable for persistent chest pain.  Assessment & Plan:   Active Problems:   History of drug abuse (HCC)   GERD (gastroesophageal reflux disease)   Chronic hepatitis C with cirrhosis (HCC)   Chest pain on breathing   Essential hypertension   Type 2 diabetes mellitus with complication, without long-term current use of insulin (HCC)   CKD (chronic kidney disease) stage 3, GFR 30-59 ml/min (HCC)   Acute on chronic combined systolic and diastolic heart failure (HCC)   Atrial flutter (HCC)   Cirrhosis (HCC)   Chronic hyponatremia   Alcohol abuse   Elevated troponin   Tobacco abuse   CHF exacerbation (HCC)  Acute on chronic combined systolic/diastolic heart failure: Secondary to noncompliance, cocaine abuse, alcohol abuse.  Mild elevated troponin secondary to CHF.    Restrict fluid intake to less than 1.2 L a day. Cardiology following.Continue spironolactone.He was a supposed to be on torsemide,hydralazine and Imdur at home. He might have reached euvolemic level now.  He is not volume overloaded.  Lungs are clear.  No peripheral edema. Continue Coreg, hydralazine, Imdur, spironolactone.  Aldactone discontinued due to increase in creatinine.  Chest pain: Atypical chest pain.  Reproducible on palpation on lower sternal area.  Will try NSAIDs for possible  costochondritis.  Also continue Protonix for possible GERD, patient denies history of GERD though.  Troponins mild elevated most likely associated  with CHF.EKG shows T wave inversion in lateral leads. Continue Nitrostat.  Will order echocardiogram and CT chest without contrast.  Atrial flutter:CHA2DS2 vas score 4.  Continue Eliquis.  Currently rate is controlled.  Continue Coreg.  Hepatitis C with cirrhosis:   Needs to follow-up as an outpatient for treatment.  CKD stage III: Creatinine today 1.77.Diuretics on hold.  Hyponatremia:Improving.  Chronic alcohol abuse: Not on withdrawal  Diabetes mellitus: Continue sliding scale insulin.HbA1C of 7.3  Suspected hematochezia: Minimal.  Not bleeding at present.  Hemoglobin stable.  No need of further evaluation.  Substance abuse: Uses cocaine.  Counseled for cessation  Smoker: Smokes half pack a day.  Counseled for cessation.  Continue nicotine patch  Homelessness: Education officer, museum consulted.        DVT prophylaxis: Eliquis Code Status: Full Family Communication: None present at the bedside Disposition Plan: Back to the previous environment after resolution of chest pain, cardiology clearance  Consultants: Cardiology  Procedures: None  Antimicrobials:  Anti-infectives (From admission, onward)   None      Subjective:  Patient seen and examined the bedside this morning.  Still complains of squeezing chest pain mainly in the mid/lower sternal region.  Not volume overloaded today.  No shortness of breath.  Objective: Vitals:   05/26/19 1323 05/26/19 2115 05/27/19 0515 05/27/19 0911  BP: 114/75 114/79 116/78 127/88  Pulse: 78 75 85 91  Resp: 20 16 17 18   Temp: 97.8 F (36.6 C) (!) 97.4 F (36.3 C) 98.4 F (36.9 C) 97.6 F (36.4 C)  TempSrc: Oral Oral Oral Axillary  SpO2: 100% 100% 99% 99%  Weight:   77.7 kg   Height:        Intake/Output Summary (Last 24 hours) at 05/27/2019 1505 Last data filed at 05/27/2019 0516 Gross  per 24 hour  Intake 1080 ml  Output -  Net 1080 ml   Filed Weights   05/25/19 0502 05/26/19 0546 05/27/19 0515  Weight: 73.4 kg 75.2 kg 77.7 kg    Examination:  General exam: Not in distress,average built HEENT:PERRL,Oral mucosa moist, Ear/Nose normal on gross exam Respiratory system: Bilateral equal air entry, normal vesicular breath sounds, no wheezes or crackles  Cardiovascular system: S1 & S2 heard, RRR. No JVD, murmurs, rubs, gallops or clicks.  Pain on  Pressing the sternum Gastrointestinal system: Abdomen is nondistended, soft and nontender. No organomegaly or masses felt. Normal bowel sounds heard. Central nervous system: Alert and oriented. No focal neurological deficits. Extremities: No edema, no clubbing ,no cyanosis, distal peripheral pulses palpable. Skin: No rashes, lesions or ulcers,no icterus ,no pallor     Data Reviewed: I have personally reviewed following labs and imaging studies  CBC: Recent Labs  Lab 05/23/19 1636 05/24/19 0216 05/27/19 0547  WBC 5.5 5.4 4.0  HGB 14.0 13.0 11.8*  HCT 40.9 39.2 35.8*  MCV 88.3 89.5 93.5  PLT 196 187 237*   Basic Metabolic Panel: Recent Labs  Lab 05/23/19 1636 05/24/19 0216 05/25/19 0519 05/26/19 0021 05/27/19 0547  NA 127* 128* 131* 132* 133*  K 4.3 4.3 4.4 4.2 4.4  CL 97* 97* 95* 99 103  CO2 21* 21* 27 25 23   GLUCOSE 158* 117* 124* 138* 132*  BUN 13 18 27* 30* 32*  CREATININE 0.97 1.29* 1.52* 1.58* 1.77*  CALCIUM 8.7* 8.7* 8.8* 8.5* 8.4*  MG  --  2.0  --   --   --   PHOS  --  3.2  --   --   --    GFR: Estimated Creatinine Clearance: 47.9 mL/min (A) (by C-G formula based on SCr of 1.77 mg/dL (H)). Liver Function Tests: Recent Labs  Lab 05/23/19 1636 05/24/19 0216  AST 32 36  ALT 27 28  ALKPHOS 81 84  BILITOT 1.9* 1.9*  PROT 8.5* 8.0  ALBUMIN 3.6 3.4*   Recent Labs  Lab 05/23/19 1636  LIPASE 25   No results for input(s): AMMONIA in the last 168 hours. Coagulation Profile: Recent Labs   Lab 05/23/19 1636  INR 1.2   Cardiac Enzymes: Recent Labs  Lab 05/24/19 0741 05/25/19 1437 05/25/19 1740 05/26/19 0021 05/26/19 0547  TROPONINI 0.05* 0.03* 0.04* 0.03* 0.04*   BNP (last 3 results) No results for input(s): PROBNP in the last 8760 hours. HbA1C: No results for input(s): HGBA1C in the last 72 hours. CBG: Recent Labs  Lab 05/26/19 1133 05/26/19 1706 05/26/19 2023 05/27/19 0733 05/27/19 1230  GLUCAP 104* 147* 174* 156* 131*   Lipid Profile: No results for input(s): CHOL, HDL, LDLCALC, TRIG, CHOLHDL, LDLDIRECT in the last 72 hours. Thyroid Function Tests: No results for input(s): TSH, T4TOTAL, FREET4, T3FREE, THYROIDAB in the last 72 hours. Anemia Panel: No results for input(s): VITAMINB12, FOLATE, FERRITIN, TIBC, IRON, RETICCTPCT in the last 72 hours. Sepsis Labs: No results for input(s): PROCALCITON, LATICACIDVEN in the last 168 hours.  Recent Results (from the past 240 hour(s))  SARS Coronavirus 2 (CEPHEID - Performed in [place] hospital lab), Hosp Order     Status: None   Collection Time: 05/23/19  6:34  PM  Result Value Ref Range Status   SARS Coronavirus 2 NEGATIVE NEGATIVE Final    Comment: (NOTE) If result is NEGATIVE SARS-CoV-2 target nucleic acids are NOT DETECTED. The SARS-CoV-2 RNA is generally detectable in upper and lower  respiratory specimens during the acute phase of infection. The lowest  concentration of SARS-CoV-2 viral copies this assay can detect is 250  copies / mL. A negative result does not preclude SARS-CoV-2 infection  and should not be used as the sole basis for treatment or other  patient management decisions.  A negative result may occur with  improper specimen collection / handling, submission of specimen other  than nasopharyngeal swab, presence of viral mutation(s) within the  areas targeted by this assay, and inadequate number of viral copies  (<250 copies / mL). A negative result must be combined with clinical   observations, patient history, and epidemiological information. If result is POSITIVE SARS-CoV-2 target nucleic acids are DETECTED. The SARS-CoV-2 RNA is generally detectable in upper and lower  respiratory specimens dur ing the acute phase of infection.  Positive  results are indicative of active infection with SARS-CoV-2.  Clinical  correlation with patient history and other diagnostic information is  necessary to determine patient infection status.  Positive results do  not rule out bacterial infection or co-infection with other viruses. If result is PRESUMPTIVE POSTIVE SARS-CoV-2 nucleic acids MAY BE PRESENT.   A presumptive positive result was obtained on the submitted specimen  and confirmed on repeat testing.  While 2019 novel coronavirus  (SARS-CoV-2) nucleic acids may be present in the submitted sample  additional confirmatory testing may be necessary for epidemiological  and / or clinical management purposes  to differentiate between  SARS-CoV-2 and other Sarbecovirus currently known to infect humans.  If clinically indicated additional testing with an alternate test  methodology [phone]) is advised. The SARS-CoV-2 RNA is generally  detectable in upper and lower respiratory sp ecimens during the acute  phase of infection. The expected result is Negative. Fact Sheet for Patients:  StrictlyIdeas.no Fact Sheet for Healthcare Providers: BankingDealers.co.za This test is not yet approved or cleared by the [place] FDA and has been authorized for detection and/or diagnosis of SARS-CoV-2 by FDA under an Emergency Use Authorization (EUA).  This EUA will remain in effect (meaning this test can be used) for the duration of the COVID-19 declaration under Section 564(b)(1) of the Act, 21 U.S.C. section 360bbb-3(b)(1), unless the authorization is terminated or revoked sooner. Performed at [facility], [place]  [address] [place], [place] [address]          Radiology Studies: No results found.      Scheduled Meds: . apixaban  5 mg Oral BID  . atorvastatin  40 mg Oral Daily  . carvedilol  3.125 mg Oral BID WC  . folic acid  1 mg Oral Daily  . gabapentin  600 mg Oral TID  . hydrALAZINE  25 mg Oral BID  . insulin aspart  0-5 Units Subcutaneous QHS  . insulin aspart  0-9 Units Subcutaneous TID WC  . isosorbide mononitrate  60 mg Oral Daily  . loratadine  10 mg Oral Daily  . multivitamin with minerals  1 tablet Oral Daily  . nicotine  21 mg Transdermal Daily  . pantoprazole  40 mg Oral Daily  . polyethylene glycol  17 g Oral Daily  . sodium chloride flush  3 mL Intravenous Q12H  . spironolactone  25 mg Oral Daily  .  thiamine  100 mg Oral Daily   Or  . thiamine  100 mg Intravenous Daily   Continuous Infusions: . sodium chloride       LOS: 3 days    Time spent:35 mins. More than 50% of that time was spent in counseling and/or coordination of care.      Shelly Coss, MD [facility] Pager [phone]  If 7PM-7AM, please contact night-coverage [email or web address] Password [email or web address] 05/27/2019, 3:05 PM

## 2019-05-27 NOTE — Progress Notes (Signed)
  Echocardiogram 2D Echocardiogram has been performed.  Jennette Dubin 05/27/2019, 4:09 PM

## 2019-05-27 NOTE — Progress Notes (Signed)
Progress Note  Patient Name: Brad [name] Date of Encounter: 05/27/2019  Primary Cardiologist: [name], MD   Subjective   Still with complaints of chest pain yesterday afternoon and again this morning. EKG repeated this morning with no significant change. He states chest pressure woke him from sleep this morning and is relieved with morphine but comes back within an hour. Some associated SOB but no other symptoms. Last cocaine use was 05/21/2019.   Inpatient Medications    Scheduled Meds: . apixaban  5 mg Oral BID  . atorvastatin  40 mg Oral Daily  . carvedilol  3.125 mg Oral BID WC  . folic acid  1 mg Oral Daily  . gabapentin  600 mg Oral BID  . hydrALAZINE  25 mg Oral BID  . insulin aspart  0-5 Units Subcutaneous QHS  . insulin aspart  0-9 Units Subcutaneous TID WC  . isosorbide mononitrate  60 mg Oral Daily  . loratadine  10 mg Oral Daily  . multivitamin with minerals  1 tablet Oral Daily  . nicotine  21 mg Transdermal Daily  . pantoprazole  40 mg Oral Daily  . sodium chloride flush  3 mL Intravenous Q12H  . spironolactone  25 mg Oral Daily  . thiamine  100 mg Oral Daily   Or  . thiamine  100 mg Intravenous Daily   Continuous Infusions: . sodium chloride     PRN Meds: sodium chloride, acetaminophen **OR** acetaminophen, HYDROcodone-acetaminophen, ibuprofen, levalbuterol, morphine injection, nitroGLYCERIN, ondansetron **OR** ondansetron (ZOFRAN) IV, sodium chloride flush   Vital Signs    Vitals:   05/26/19 0546 05/26/19 1323 05/26/19 2115 05/27/19 0515  BP: (!) 98/59 114/75 114/79 116/78  Pulse: (!) 18 78 75 85  Resp: 18 20 16 17   Temp: 97.8 F (36.6 C) 97.8 F (36.6 C) (!) 97.4 F (36.3 C) 98.4 F (36.9 C)  TempSrc: Oral Oral Oral Oral  SpO2: 98% 100% 100% 99%  Weight: 75.2 kg   77.7 kg  Height:        Intake/Output Summary (Last 24 hours) at 05/27/2019 0849 Last data filed at 05/27/2019 0516 Gross per 24 hour  Intake 1320 ml  Output 450 ml   Net 870 ml   Filed Weights   05/25/19 0502 05/26/19 0546 05/27/19 0515  Weight: 73.4 kg 75.2 kg 77.7 kg    Telemetry    Sinus rhythm with PVCs, bigeminy - Personally Reviewed  ECG    Sinus rhythm with 1st degree AV block and TWI in lateral leads - no significant change from previous; no STE/D - Personally Reviewed  Physical Exam   GEN: Laying in bed in no acute distress.   Neck: No JVD, no carotid bruits Cardiac: RRR, no murmurs, rubs, or gallops.  Respiratory: Clear to auscultation bilaterally, no wheezes/ rales/ rhonchi GI: NABS, Soft, nontender, non-distended  MS: No edema; No deformity. Neuro:  Nonfocal, moving all extremities spontaneously Psych: Normal affect   Labs    Chemistry Recent Labs  Lab 05/23/19 1636 05/24/19 0216 05/25/19 0519 05/26/19 0021 05/27/19 0547  NA 127* 128* 131* 132* 133*  K 4.3 4.3 4.4 4.2 4.4  CL 97* 97* 95* 99 103  CO2 21* 21* 27 25 23   GLUCOSE 158* 117* 124* 138* 132*  BUN 13 18 27* 30* 32*  CREATININE 0.97 1.29* 1.52* 1.58* 1.77*  CALCIUM 8.7* 8.7* 8.8* 8.5* 8.4*  PROT 8.5* 8.0  --   --   --   ALBUMIN 3.6 3.4*  --   --   --  AST 32 36  --   --   --   ALT 27 28  --   --   --   ALKPHOS 81 84  --   --   --   BILITOT 1.9* 1.9*  --   --   --   GFRNONAA >60 >60 49* 47* 41*  GFRAA >60 >60 57* 55* 48*  ANIONGAP 9 10 9 8 7      Hematology Recent Labs  Lab 05/23/19 1636 05/24/19 0216 05/27/19 0547  WBC 5.5 5.4 4.0  RBC 4.63 4.38 3.83*  HGB 14.0 13.0 11.8*  HCT 40.9 39.2 35.8*  MCV 88.3 89.5 93.5  MCH 30.2 29.7 30.8  MCHC 34.2 33.2 33.0  RDW 15.0 15.1 15.7*  PLT 196 187 139*    Cardiac Enzymes Recent Labs  Lab 05/25/19 1437 05/25/19 1740 05/26/19 0021 05/26/19 0547  TROPONINI 0.03* 0.04* 0.03* 0.04*   No results for input(s): TROPIPOC in the last 168 hours.   BNP Recent Labs  Lab 05/23/19 1636  BNP 3,418.4*     DDimer No results for input(s): DDIMER in the last 168 hours.   Radiology    No results  found.  Cardiac Studies   Echocardiogram 02/2019: IMPRESSIONS    1. The left ventricle has severely reduced systolic function, with an ejection fraction of 20-25%. The cavity size was normal. There is moderately increased left ventricular wall thickness. Left ventricular diastolic Doppler parameters are consistent  with pseudonormalization Elevated left ventricular end-diastolic pressure Left ventrical global hypokinesis without regional wall motion abnormalities.  2. The right ventricle has normal systolic function. The cavity was normal. There is no increase in right ventricular wall thickness.  3. Left atrial size was severely dilated.  4. Right atrial size was severely dilated.  5. The mitral valve is normal in structure. Mitral valve regurgitation is mild to moderate by color flow Doppler.  6. The tricuspid valve is normal in structure. Tricuspid valve regurgitation is moderate.  7. The aortic valve is tricuspid Aortic valve regurgitation is mild to moderate by color flow Doppler.  8. The pulmonic valve was normal in structure. Pulmonic valve regurgitation is mild by color flow Doppler.  9. The inferior vena cava was dilated in size with <50% respiratory variability.  Patient Profile     58 y.o. male with PMH of chronic combined CHF (EF 20-25%, G2DD on echo 02/2019), HTN, HLD, atrial flutter, cirrhosis, hepatitis C, DM type 2, tobacco abuse, and polysubstance abuse, who presented with SOB. Cardiology following for CHF.   Assessment & Plan    1. Chest pain: patient reports intermittent chest pain this admission which is improved with SL nitro and morphine, however continues to return. Comes on a rest, pressure sensation, associated SOB. EKG non-ischemic. Trop trend flat on admission. Utox positive for cocaine, though it has been almost a week since his last use.  - Could consider NST to r/o ischemia. He does have CAD which was mild on cath in 2017. Possible he has had progression of  disease since that time.  - Can consider uptitrating imdur if BP will tolerate  2. Acute on chronic combined CHF/ non-ischemic cardiomyopathy: patient presented with SOB, felt to be 2/2 medication non-compliance and ongoing polysubstance abuse. He was diuresed with IV lasix, held yesterday given uptrending Cr. He was restarted on hydralazine and imdur with adjustments to accommodate BP. Home spironolactone and carvedilol resumed. Cr up to 1.77 today from 1.58 yesterday. UOP net +869mL in the past 24  hours and -615mL this admission. Weight up to 171lbs today from 165lbs yesterday - Continue carvedilol, hydralazine, imdur, and spironolactone - Will hold off on resuming torsemide given uptrending Cr - suspect he will need a lower dose when resumed (home dose 60mg  QAM and 40mg  QPM). He appears euvolemic today - Will liberalize fluid restrictions to 1.5L per day   3. HTN: BP stable - Continue management in the context of #1  4. Paroxysmal atrial fibrillation/flutter: maintaining sinus rhythm.  - Continue carvedilol for rate control - Continue apixaban for stroke ppx  5. Acute on CKD: Cr up to 1.77 today from 1.58 yesterday. Diuretics on hold - Continue to monitor  6. Polysubstance abuse: Utox positive for cocaine. Also with ongoing tobacco and ETOH abuse.  - Continue to encourage cessation  7. Homelessness: Social work assisting    For questions or updates, please contact [facility] Please consult [email or web address] for contact info under Cardiology/STEMI.      Signed, Abigail Butts, PA-C  05/27/2019, 8:49 AM   [phone]

## 2019-05-27 NOTE — Progress Notes (Signed)
Patient c/o of tightness in chest. Vitals: 98.23F;HR85;RR17;116/78;99% RA. Patient was given 2 mg Morphine IV and 12 Lead EKG was performed. PCP was notified.

## 2019-05-27 NOTE — Telephone Encounter (Signed)
New Message   Pt calling to speak with you, informed you were not here and pt requested a call back when you return

## 2019-05-28 LAB — BASIC METABOLIC PANEL
Anion gap: 10 (ref 5–15)
BUN: 34 mg/dL — ABNORMAL HIGH (ref 6–20)
CO2: 18 mmol/L — ABNORMAL LOW (ref 22–32)
Calcium: 8.6 mg/dL — ABNORMAL LOW (ref 8.9–10.3)
Chloride: 102 mmol/L (ref 98–111)
Creatinine, Ser: 1.54 mg/dL — ABNORMAL HIGH (ref 0.61–1.24)
GFR calc Af Amer: 56 mL/min — ABNORMAL LOW (ref >60)
GFR calc non Af Amer: 49 mL/min — ABNORMAL LOW (ref >60)
Glucose, Bld: 150 mg/dL — ABNORMAL HIGH (ref 70–99)
Potassium: 5.5 mmol/L — ABNORMAL HIGH (ref 3.5–5.1)
Sodium: 130 mmol/L — ABNORMAL LOW (ref 135–145)

## 2019-05-28 LAB — GLUCOSE, CAPILLARY
Glucose-Capillary: 150 mg/dL — ABNORMAL HIGH (ref 70–99)
Glucose-Capillary: 190 mg/dL — ABNORMAL HIGH (ref 70–99)

## 2019-05-28 MED ORDER — PANTOPRAZOLE SODIUM 40 MG PO TBEC: 40.0000 mg | DELAYED_RELEASE_TABLET | Freq: Every day | ORAL | 0 refills | Status: DC

## 2019-05-28 MED ORDER — SODIUM POLYSTYRENE SULFONATE 15 GM/60ML PO SUSP
30.0000 g | Freq: Once | ORAL | Status: AC
  Administered 2019-05-28: 30 g via ORAL
  Filled 2019-05-28: qty 120

## 2019-05-28 MED ORDER — THIAMINE HCL 100 MG PO TABS: 100.0000 mg | ORAL_TABLET | Freq: Every day | ORAL | 0 refills | Status: DC

## 2019-05-28 MED ORDER — HYDRALAZINE HCL 25 MG PO TABS: 25.0000 mg | ORAL_TABLET | Freq: Two times a day (BID) | ORAL | 0 refills | Status: DC

## 2019-05-28 MED ORDER — POLYETHYLENE GLYCOL 3350 17 G PO PACK: 17.0000 g | PACK | Freq: Every day | ORAL | 0 refills | Status: DC | PRN

## 2019-05-28 MED ORDER — FOLIC ACID 1 MG PO TABS: 1.0000 mg | ORAL_TABLET | Freq: Every day | ORAL | 0 refills | Status: DC

## 2019-05-28 MED ORDER — ISOSORBIDE MONONITRATE ER 60 MG PO TB24: 60.0000 mg | ORAL_TABLET | Freq: Every day | ORAL | 0 refills | Status: DC

## 2019-05-28 NOTE — Progress Notes (Signed)
AVS given to patient and explained at the bedside. Medications and follow up appointments have been explained with pt verbalizing understanding.  

## 2019-05-28 NOTE — Discharge Summary (Signed)
Physician Discharge Summary  Brad [name] MWU:[id number] DOB: 1960-12-26 DOA: 05/23/2019  PCP: Charlott Rakes, MD  Admit date: 05/23/2019 Discharge date: 05/28/2019  Admitted From: Home Disposition:  Home  Discharge Condition:Stable CODE STATUS:FULL Diet recommendation: Heart Healthy   Brief/Interim Summary: Patient is a 58 year old male with history of combined systolic and diastolic CHF with ejection fraction of 20-25%, cardiomyopathy suspected to be secondary to alcohol/cocaine abuse, atrial flutter, cirrhosis, hepatitis C, diabetes type 2, CKD stage III, hyperlipidemia, tobacco abuse, chronic alcohol abuse, hypertension who presented to the emergency department shortness of breath.  He was admitted for the management of CHF exacerbation.  Started on IV diuresis.  Cardiology consulted and was following.  Hospital course remarkable for persistent chest pain. CT chest did not reveal any acute intrathoracic abnormality.  Echocardiogram showed ejection fraction of 30 to 35%, mild improvement  from his previous echo.  His chest pain has improved today.  His troponins were flat.  EKG did not show any new ischemic changes. He is hemodynamically stable for discharge to home today.  We have adjusted his medications.  He needs to follow-up with his cardiologist as an outpatient.  Following problems were addressed during his hospitalization:  Acute on chronic combined systolic/diastolic heart failure: Secondary to noncompliance, cocaine abuse, alcohol abuse.  Mild elevated troponin secondary to CHF.    Restrict fluid intake to less than 1.2 L a day. Cardiology following.He was a supposed to be on torsemide,hydralazine and Imdur at home. He has reached euvolemic level now.  He is not volume overloaded.  Lungs are clear.  No peripheral edema. Continue Coreg, hydralazine, Imdur.  Aldactone discontinued due to increase in creatinine.Doses were adjusted.  Chest pain: Atypical chest pain.  Reproducible on  palpation on lower sternal area. NSAIDs helped.  Also continue Protonix for possible GERD,   Troponins mild elevated most likely associated  with CHF.EKG shows T wave inversion in lateral leads.  Atrial flutter:CHA2DS2 vas score 4.  Continue Eliquis.  Currently rate is controlled.  Continue Coreg.  Hepatitis C with cirrhosis:   Needs to follow-up as an outpatient for treatment.His PCP should help with that.  CKD stage III: Creatinine today 1.5. Monitor as an outpatient  Hyponatremia:Improving.  Chronic alcohol abuse: Not on withdrawal  Diabetes mellitus: Resume home regimen.HbA1C of 7.3  Suspected hematochezia: Minimal.  Not bleeding at present.  Hemoglobin stable.  No need of further evaluation.  Substance abuse: Uses cocaine.  Counseled for cessation  Smoker: Smokes half pack a day.  Counseled for cessation.      Discharge Diagnoses:  Active Problems:   History of drug abuse (HCC)   GERD (gastroesophageal reflux disease)   Chronic hepatitis C with cirrhosis (HCC)   Chest pain on breathing   Essential hypertension   Type 2 diabetes mellitus with complication, without long-term current use of insulin (HCC)   CKD (chronic kidney disease) stage 3, GFR 30-59 ml/min (HCC)   Acute on chronic combined systolic and diastolic heart failure (HCC)   Atrial flutter (HCC)   Cirrhosis (HCC)   Chronic hyponatremia   Alcohol abuse   Elevated troponin   Tobacco abuse   CHF exacerbation [facility])    Discharge Instructions  Discharge Instructions    Diet - low sodium heart healthy   Complete by:  As directed    Discharge instructions   Complete by:  As directed    1) Please follow up with your PCP in a week. Do a BMP test during the follow up. 2)Follow  up with your cardiologist as an outpatient in 2 weeks. 3)Take prescribed medications as instructed.  We have adjusted some of your medicines.   Increase activity slowly   Complete by:  As directed      Allergies as of  05/28/2019      Reactions   Angiotensin Receptor Blockers Anaphylaxis, Other (See Comments)   (Angioedema also with Lisinopril, therefore ARB's are contraindicated)   Lisinopril Anaphylaxis, Other (See Comments)   Throat swells   Pamelor [nortriptyline Hcl] Anaphylaxis, Swelling   Throat swells      Medication List    STOP taking these medications   sildenafil 50 MG tablet Commonly known as:  Viagra   spironolactone 25 MG tablet Commonly known as:  ALDACTONE     TAKE these medications   Accu-Chek FastClix Lancets Misc Use as directed three times daily   Accu-Chek Guide w/Device Kit 1 each by Does not apply route 3 (three) times daily.   amitriptyline 75 MG tablet Commonly known as:  ELAVIL Take 1 tablet (75 mg total) by mouth at bedtime. Needs appt for more refills.   apixaban 5 MG Tabs tablet Commonly known as:  ELIQUIS Take 1 tablet (5 mg total) by mouth 2 (two) times daily.   atorvastatin 40 MG tablet Commonly known as:  LIPITOR Take 1 tablet (40 mg total) by mouth daily.   carvedilol 3.125 MG tablet Commonly known as:  COREG TAKE 1 TABLET (3.125 MG TOTAL) BY MOUTH 2 (TWO) TIMES DAILY WITH A MEAL. What changed:  See the new instructions.   cetirizine 10 MG tablet Commonly known as:  ZYRTEC TAKE 1 TABLET (10 MG TOTAL) BY MOUTH DAILY.   Easy Comfort Pen Needles 31G X 8 MM Misc Generic drug:  Insulin Pen Needle USE AS DIRECTED TWICE A DAY   folic acid 1 MG tablet Commonly known as:  FOLVITE Take 1 tablet (1 mg total) by mouth daily. Start taking on:  May 29, 2019   gabapentin 300 MG capsule Commonly known as:  NEURONTIN TAKE 2 CAPSULES (600 MG TOTAL) BY MOUTH 2 (TWO) TIMES DAILY.   glucose blood test strip Commonly known as:  Accu-Chek Guide Use as directed three times daily   hydrALAZINE 25 MG tablet Commonly known as:  APRESOLINE Take 1 tablet (25 mg total) by mouth 2 (two) times a day. What changed:    how much to take  when to take this    hydrocortisone 2.5 % cream Apply 1 application topically 2 (two) times daily as needed (rash).   isosorbide mononitrate 60 MG 24 hr tablet Commonly known as:  IMDUR Take 1 tablet (60 mg total) by mouth daily. Start taking on:  May 29, 2019 What changed:    medication strength  how much to take   losartan 25 MG tablet Commonly known as:  COZAAR Take 25 mg by mouth daily.   pantoprazole 40 MG tablet Commonly known as:  PROTONIX Take 1 tablet (40 mg total) by mouth daily. Start taking on:  May 29, 2019   polyethylene glycol 17 g packet Commonly known as:  MIRALAX / GLYCOLAX Take 17 g by mouth daily as needed.   Potassium Chloride ER 20 MEQ Tbcr Take 20 mEq by mouth daily.   thiamine 100 MG tablet Take 1 tablet (100 mg total) by mouth daily. Start taking on:  May 29, 2019   torsemide 20 MG tablet Commonly known as:  DEMADEX Take 3 tablets (60 mg total) by mouth every morning  AND 2 tablets (40 mg total) at bedtime.      Follow-up Information    Charlott Rakes, MD. Schedule an appointment as soon as possible for a visit in 1 week(s).   Specialty:  Family Medicine Contact information: [address] [place] [address] [phone]          Allergies  Allergen Reactions  . Angiotensin Receptor Blockers Anaphylaxis and Other (See Comments)    (Angioedema also with Lisinopril, therefore ARB's are contraindicated)  . Lisinopril Anaphylaxis and Other (See Comments)    Throat swells  . Pamelor [Nortriptyline Hcl] Anaphylaxis and Swelling    Throat swells    Consultations: Cardiology  Procedures/Studies: Ct Chest Wo Contrast  Result Date: 05/27/2019 CLINICAL DATA:  Shortness of breath, CHF exacerbation, cirrhosis EXAM: CT CHEST WITHOUT CONTRAST TECHNIQUE: Multidetector CT imaging of the chest was performed following the standard protocol without IV contrast. COMPARISON:  08/16/2018 FINDINGS: Cardiovascular: Aortic atherosclerosis. Cardiomegaly.  Three-vessel coronary artery calcifications. No pericardial effusion. Mediastinum/Nodes: No enlarged mediastinal, hilar, or axillary lymph nodes. Thyroid gland, trachea, and esophagus demonstrate no significant findings. Lungs/Pleura: Mild centrilobular emphysema. Small bilateral pleural effusions and associated atelectasis or consolidation. Mild interlobular septal thickening. Upper Abdomen: No acute abnormality.  Nodular contour of the liver. Musculoskeletal: No chest wall mass or suspicious bone lesions identified. IMPRESSION: 1. Small bilateral pleural effusions and associated atelectasis or consolidation. Mild interlobular septal thickening. Findings are consistent with mild edema. 2.  Cardiomegaly and coronary artery disease. 3.  Mild emphysema. Electronically Signed   By: Eddie Candle M.D.   On: 05/27/2019 17:34   Dg Chest Port 1 View  Result Date: 05/23/2019 CLINICAL DATA:  Shortness of breath EXAM: PORTABLE CHEST 1 VIEW COMPARISON:  04/16/2019 FINDINGS: Cardiomegaly. Pulmonary venous hypertension with early interstitial edema. No infiltrate, collapse or effusion. No acute bone finding. IMPRESSION: Congestive heart failure.  Cardiomegaly and interstitial edema. Electronically Signed   By: Nelson Chimes M.D.   On: 05/23/2019 16:46      Subjective: Patient seen and examined the bedside this morning.  Hemodynamically stable.  Chest pain has improved.  Stable for discharge to home today.  Discharge Exam: Vitals:   05/27/19 2109 05/28/19 0445  BP: 119/84 124/90  Pulse: 85 78  Resp: 17 17  Temp: 97.8 F (36.6 C) 98.1 F (36.7 C)  SpO2: 100% 96%   Vitals:   05/27/19 0911 05/27/19 1520 05/27/19 2109 05/28/19 0445  BP: 127/88 116/84 119/84 124/90  Pulse: 91 86 85 78  Resp: _0 Temp: 97.6 F (36.4 C) 98.9 F (37.2 C) 97.8 F (36.6 C) 98.1 F (36.7 C)  TempSrc: Axillary Axillary Oral Oral  SpO2: 99% 100% 100% 96%  Weight:      Height:        General: Pt is alert, awake, not  in acute distress Cardiovascular: RRR, S1/S2 +, no rubs, no gallops Respiratory: CTA bilaterally, no wheezing, no rhonchi Abdominal: Soft, NT, ND, bowel sounds + Extremities: no edema, no cyanosis    The results of significant diagnostics from this hospitalization (including imaging, microbiology, ancillary and laboratory) are listed below for reference.     Microbiology: Recent Results (from the past 240 hour(s))  SARS Coronavirus 2 (CEPHEID - Performed in [place] hospital lab), Hosp Order     Status: None   Collection Time: 05/23/19  6:34 PM  Result Value Ref Range Status   SARS Coronavirus 2 NEGATIVE NEGATIVE Final    Comment: (NOTE) If result  is NEGATIVE SARS-CoV-2 target nucleic acids are NOT DETECTED. The SARS-CoV-2 RNA is generally detectable in upper and lower  respiratory specimens during the acute phase of infection. The lowest  concentration of SARS-CoV-2 viral copies this assay can detect is 250  copies / mL. A negative result does not preclude SARS-CoV-2 infection  and should not be used as the sole basis for treatment or other  patient management decisions.  A negative result may occur with  improper specimen collection / handling, submission of specimen other  than nasopharyngeal swab, presence of viral mutation(s) within the  areas targeted by this assay, and inadequate number of viral copies  (<250 copies / mL). A negative result must be combined with clinical  observations, patient history, and epidemiological information. If result is POSITIVE SARS-CoV-2 target nucleic acids are DETECTED. The SARS-CoV-2 RNA is generally detectable in upper and lower  respiratory specimens dur ing the acute phase of infection.  Positive  results are indicative of active infection with SARS-CoV-2.  Clinical  correlation with patient history and other diagnostic information is  necessary to determine patient infection status.  Positive results do  not rule out bacterial  infection or co-infection with other viruses. If result is PRESUMPTIVE POSTIVE SARS-CoV-2 nucleic acids MAY BE PRESENT.   A presumptive positive result was obtained on the submitted specimen  and confirmed on repeat testing.  While 2019 novel coronavirus  (SARS-CoV-2) nucleic acids may be present in the submitted sample  additional confirmatory testing may be necessary for epidemiological  and / or clinical management purposes  to differentiate between  SARS-CoV-2 and other Sarbecovirus currently known to infect humans.  If clinically indicated additional testing with an alternate test  methodology [phone]) is advised. The SARS-CoV-2 RNA is generally  detectable in upper and lower respiratory sp ecimens during the acute  phase of infection. The expected result is Negative. Fact Sheet for Patients:  StrictlyIdeas.no Fact Sheet for Healthcare Providers: BankingDealers.co.za This test is not yet approved or cleared by the [place] FDA and has been authorized for detection and/or diagnosis of SARS-CoV-2 by FDA under an Emergency Use Authorization (EUA).  This EUA will remain in effect (meaning this test can be used) for the duration of the COVID-19 declaration under Section 564(b)(1) of the Act, 21 U.S.C. section 360bbb-3(b)(1), unless the authorization is terminated or revoked sooner. Performed at [facility], [place] [address], [place], [place] [address]      Labs: BNP (last 3 results) Recent Labs    03/23/19 0946 04/16/19 1309 05/23/19 1636  BNP 1,652.9* 973.4* 5,750.5*   Basic Metabolic Panel: Recent Labs  Lab 05/24/19 0216 05/25/19 0519 05/26/19 0021 05/27/19 0547 05/28/19 0432  NA 128* 131* 132* 133* 130*  K 4.3 4.4 4.2 4.4 5.5*  CL 97* 95* 99 103 102  CO2 21* _0 18*  GLUCOSE 117* 124* 138* 132* 150*  BUN 18 27* 30* 32* 34*  CREATININE 1.29* 1.52* 1.58* 1.77* 1.54*  CALCIUM 8.7* 8.8*  8.5* 8.4* 8.6*  MG 2.0  --   --   --   --   PHOS 3.2  --   --   --   --    Liver Function Tests: Recent Labs  Lab 05/23/19 1636 05/24/19 0216  AST 32 36  ALT 27 28  ALKPHOS 81 84  BILITOT 1.9* 1.9*  PROT 8.5* 8.0  ALBUMIN 3.6 3.4*   Recent Labs  Lab 05/23/19 1636  LIPASE 25   No  results for input(s): AMMONIA in the last 168 hours. CBC: Recent Labs  Lab 05/23/19 1636 05/24/19 0216 05/27/19 0547  WBC 5.5 5.4 4.0  HGB 14.0 13.0 11.8*  HCT 40.9 39.2 35.8*  MCV 88.3 89.5 93.5  PLT 196 187 139*   Cardiac Enzymes: Recent Labs  Lab 05/24/19 0741 05/25/19 1437 05/25/19 1740 05/26/19 0021 05/26/19 0547  TROPONINI 0.05* 0.03* 0.04* 0.03* 0.04*   BNP: Invalid input(s): POCBNP CBG: Recent Labs  Lab 05/27/19 0733 05/27/19 1230 05/27/19 1700 05/27/19 2112 05/28/19 0732  GLUCAP 156* 131* 143* 139* 150*   D-Dimer No results for input(s): DDIMER in the last 72 hours. Hgb A1c No results for input(s): HGBA1C in the last 72 hours. Lipid Profile No results for input(s): CHOL, HDL, LDLCALC, TRIG, CHOLHDL, LDLDIRECT in the last 72 hours. Thyroid function studies No results for input(s): TSH, T4TOTAL, T3FREE, THYROIDAB in the last 72 hours.  Invalid input(s): FREET3 Anemia work up No results for input(s): VITAMINB12, FOLATE, FERRITIN, TIBC, IRON, RETICCTPCT in the last 72 hours. Urinalysis    Component Value Date/Time   COLORURINE YELLOW 05/23/2019 2107   APPEARANCEUR CLEAR 05/23/2019 2107   LABSPEC 1.005 05/23/2019 2107   PHURINE 6.0 05/23/2019 2107   GLUCOSEU NEGATIVE 05/23/2019 2107   HGBUR SMALL (A) 05/23/2019 2107   BILIRUBINUR NEGATIVE 05/23/2019 2107   BILIRUBINUR neg 05/08/2017 1503   KETONESUR NEGATIVE 05/23/2019 2107   PROTEINUR 100 (A) 05/23/2019 2107   UROBILINOGEN 0.2 05/08/2017 1503   UROBILINOGEN 1.0 04/26/2012 1328   NITRITE NEGATIVE 05/23/2019 2107   LEUKOCYTESUR NEGATIVE 05/23/2019 2107   Sepsis Labs Invalid input(s): PROCALCITONIN,   WBC,  LACTICIDVEN Microbiology Recent Results (from the past 240 hour(s))  SARS Coronavirus 2 (CEPHEID - Performed in [place] hospital lab), Hosp Order     Status: None   Collection Time: 05/23/19  6:34 PM  Result Value Ref Range Status   SARS Coronavirus 2 NEGATIVE NEGATIVE Final    Comment: (NOTE) If result is NEGATIVE SARS-CoV-2 target nucleic acids are NOT DETECTED. The SARS-CoV-2 RNA is generally detectable in upper and lower  respiratory specimens during the acute phase of infection. The lowest  concentration of SARS-CoV-2 viral copies this assay can detect is 250  copies / mL. A negative result does not preclude SARS-CoV-2 infection  and should not be used as the sole basis for treatment or other  patient management decisions.  A negative result may occur with  improper specimen collection / handling, submission of specimen other  than nasopharyngeal swab, presence of viral mutation(s) within the  areas targeted by this assay, and inadequate number of viral copies  (<250 copies / mL). A negative result must be combined with clinical  observations, patient history, and epidemiological information. If result is POSITIVE SARS-CoV-2 target nucleic acids are DETECTED. The SARS-CoV-2 RNA is generally detectable in upper and lower  respiratory specimens dur ing the acute phase of infection.  Positive  results are indicative of active infection with SARS-CoV-2.  Clinical  correlation with patient history and other diagnostic information is  necessary to determine patient infection status.  Positive results do  not rule out bacterial infection or co-infection with other viruses. If result is PRESUMPTIVE POSTIVE SARS-CoV-2 nucleic acids MAY BE PRESENT.   A presumptive positive result was obtained on the submitted specimen  and confirmed on repeat testing.  While 2019 novel coronavirus  (SARS-CoV-2) nucleic acids may be present in the submitted sample  additional confirmatory  testing may be necessary for  epidemiological  and / or clinical management purposes  to differentiate between  SARS-CoV-2 and other Sarbecovirus currently known to infect humans.  If clinically indicated additional testing with an alternate test  methodology [phone]) is advised. The SARS-CoV-2 RNA is generally  detectable in upper and lower respiratory sp ecimens during the acute  phase of infection. The expected result is Negative. Fact Sheet for Patients:  StrictlyIdeas.no Fact Sheet for Healthcare Providers: BankingDealers.co.za This test is not yet approved or cleared by the [place] FDA and has been authorized for detection and/or diagnosis of SARS-CoV-2 by FDA under an Emergency Use Authorization (EUA).  This EUA will remain in effect (meaning this test can be used) for the duration of the COVID-19 declaration under Section 564(b)(1) of the Act, 21 U.S.C. section 360bbb-3(b)(1), unless the authorization is terminated or revoked sooner. Performed at [facility], [place] [address] [place], [place] [address]     Please note: You were cared for by a hospitalist during your hospital stay. Once you are discharged, your primary care physician will handle any further medical issues. Please note that NO REFILLS for any discharge medications will be authorized once you are discharged, as it is imperative that you return to your primary care physician (or establish a relationship with a primary care physician if you do not have one) for your post hospital discharge needs so that they can reassess your need for medications and monitor your lab values.    Time coordinating discharge: 40 minutes  SIGNED:   Shelly Coss, MD  [facility] 05/28/2019, 10:36 AM Pager [phone]  If 7PM-7AM, please contact night-coverage [email or web address] Password [email or web address]

## 2019-05-28 NOTE — Progress Notes (Signed)
Progress Note  Patient Name: Brad [name] Date of Encounter: 05/28/2019  Primary Cardiologist: [name], MD   Subjective   Still having epigastric pain at times. No SOB. Walked to bathroom without issues.   Inpatient Medications    Scheduled Meds: . apixaban  5 mg Oral BID  . atorvastatin  40 mg Oral Daily  . carvedilol  3.125 mg Oral BID WC  . folic acid  1 mg Oral Daily  . gabapentin  600 mg Oral TID  . hydrALAZINE  25 mg Oral BID  . insulin aspart  0-5 Units Subcutaneous QHS  . insulin aspart  0-9 Units Subcutaneous TID WC  . isosorbide mononitrate  60 mg Oral Daily  . loratadine  10 mg Oral Daily  . multivitamin with minerals  1 tablet Oral Daily  . nicotine  21 mg Transdermal Daily  . pantoprazole  40 mg Oral Daily  . polyethylene glycol  17 g Oral Daily  . sodium chloride flush  3 mL Intravenous Q12H  . thiamine  100 mg Oral Daily   Or  . thiamine  100 mg Intravenous Daily   Continuous Infusions: . sodium chloride     PRN Meds: sodium chloride, acetaminophen **OR** acetaminophen, HYDROcodone-acetaminophen, ibuprofen, levalbuterol, morphine injection, nitroGLYCERIN, ondansetron **OR** ondansetron (ZOFRAN) IV, sodium chloride flush   Vital Signs    Vitals:   05/27/19 0911 05/27/19 1520 05/27/19 2109 05/28/19 0445  BP: 127/88 116/84 119/84 124/90  Pulse: 91 86 85 78  Resp: 18 16 17 17   Temp: 97.6 F (36.4 C) 98.9 F (37.2 C) 97.8 F (36.6 C) 98.1 F (36.7 C)  TempSrc: Axillary Axillary Oral Oral  SpO2: 99% 100% 100% 96%  Weight:      Height:        Intake/Output Summary (Last 24 hours) at 05/28/2019 0943 Last data filed at 05/28/2019 0251 Gross per 24 hour  Intake 536 ml  Output -  Net 536 ml   Last 3 Weights 05/27/2019 05/26/2019 05/25/2019  Weight (lbs) 171 lb 4.8 oz 165 lb 11.2 oz 161 lb 14.4 oz  Weight (kg) 77.7 kg 75.161 kg 73.437 kg  Some encounter information is confidential and restricted. Go to Review Flowsheets activity to see all  data.      Telemetry    No adverse arrhythmias- Personally Reviewed  ECG    Sinus rhythm, subtle T wave inversions noted in V5.  - Personally Reviewed  Physical Exam   GEN: No acute distress.   Neck: No JVD Cardiac: RRR, no murmurs, rubs, or gallops.  Respiratory: Clear to auscultation bilaterally. GI: Soft, nontender, non-distended  MS: No edema; No deformity. Neuro:  Nonfocal  Psych: Normal affect   Labs    Chemistry Recent Labs  Lab 05/23/19 1636 05/24/19 0216  05/26/19 0021 05/27/19 0547 05/28/19 0432  NA 127* 128*   < > 132* 133* 130*  K 4.3 4.3   < > 4.2 4.4 5.5*  CL 97* 97*   < > 99 103 102  CO2 21* 21*   < > 25 23 18*  GLUCOSE 158* 117*   < > 138* 132* 150*  BUN 13 18   < > 30* 32* 34*  CREATININE 0.97 1.29*   < > 1.58* 1.77* 1.54*  CALCIUM 8.7* 8.7*   < > 8.5* 8.4* 8.6*  PROT 8.5* 8.0  --   --   --   --   ALBUMIN 3.6 3.4*  --   --   --   --  AST 32 36  --   --   --   --   ALT 27 28  --   --   --   --   ALKPHOS 81 84  --   --   --   --   BILITOT 1.9* 1.9*  --   --   --   --   GFRNONAA >60 >60   < > 47* 41* 49*  GFRAA >60 >60   < > 55* 48* 56*  ANIONGAP 9 10   < > 8 7 10    < > = values in this interval not displayed.     Hematology Recent Labs  Lab 05/23/19 1636 05/24/19 0216 05/27/19 0547  WBC 5.5 5.4 4.0  RBC 4.63 4.38 3.83*  HGB 14.0 13.0 11.8*  HCT 40.9 39.2 35.8*  MCV 88.3 89.5 93.5  MCH 30.2 29.7 30.8  MCHC 34.2 33.2 33.0  RDW 15.0 15.1 15.7*  PLT 196 187 139*    Cardiac Enzymes Recent Labs  Lab 05/25/19 1437 05/25/19 1740 05/26/19 0021 05/26/19 0547  TROPONINI 0.03* 0.04* 0.03* 0.04*   No results for input(s): TROPIPOC in the last 168 hours.   BNP Recent Labs  Lab 05/23/19 1636  BNP 3,418.4*     DDimer No results for input(s): DDIMER in the last 168 hours.   Radiology    Ct Chest Wo Contrast  Result Date: 05/27/2019 CLINICAL DATA:  Shortness of breath, CHF exacerbation, cirrhosis EXAM: CT CHEST WITHOUT CONTRAST  TECHNIQUE: Multidetector CT imaging of the chest was performed following the standard protocol without IV contrast. COMPARISON:  08/16/2018 FINDINGS: Cardiovascular: Aortic atherosclerosis. Cardiomegaly. Three-vessel coronary artery calcifications. No pericardial effusion. Mediastinum/Nodes: No enlarged mediastinal, hilar, or axillary lymph nodes. Thyroid gland, trachea, and esophagus demonstrate no significant findings. Lungs/Pleura: Mild centrilobular emphysema. Small bilateral pleural effusions and associated atelectasis or consolidation. Mild interlobular septal thickening. Upper Abdomen: No acute abnormality.  Nodular contour of the liver. Musculoskeletal: No chest wall mass or suspicious bone lesions identified. IMPRESSION: 1. Small bilateral pleural effusions and associated atelectasis or consolidation. Mild interlobular septal thickening. Findings are consistent with mild edema. 2.  Cardiomegaly and coronary artery disease. 3.  Mild emphysema. Electronically Signed   By: Eddie Candle M.D.   On: 05/27/2019 17:34    Cardiac Studies   Echocardiogram 05/27/2019-EF 30 to 35%.  EF reported at 20 to 25% in March  Patient Profile     58 y.o. male with nonischemic cardiomyopathy, paroxysmal atrial fibrillation admitted with chest pain and mildly elevated troponin in the context of medication nonadherence and cocaine use.  Medication nonadherence and cocaine use.  Assessment & Plan    Chest discomfort/atypical -Chest discomfort chest discomfort chest discomfort. -There was some question of whether or not this could be esophageal dysmotility. -Continue with supportive care. -No further recommendations.  Elevated troponin - Low level 0.04 flat.  Does not appear to be acute coronary syndrome despite chest discomfort. - Chronically low elevation likely in the setting of chronic kidney disease/chronic systolic heart failure.  Chronic systolic heart failure - Currently appears euvolemic.    -Medications reviewed including hydralazine isosorbide and low-dose carvedilol which in the setting of cocaine is less risk given its alpha inhibition.  Blood pressure does not allow for further increase in medication.  Atrial flutter -CHADSVASc at least 4, continue with Eliquis.  Cocaine use - Continue to counsel cessation.  This is a very high risk activity given his underlying cardiomyopathy and could lead to death. -  I would encourage cessation or avoidance of opiates.  Unfortunately, several social issues including homelessness and substance abuse that are likely at the crux of his issues.  Continue to provide as much assistance as possible and these realms.   [facility] will sign off.   Medication Recommendations:  Would resume home torsemide at DC. OK to stop spironolactone given AKI. Other recommendations (labs, testing, etc):  BMET as outpatient Follow up as an outpatient:  Dr. Aundra Dubin in heart failure clinic.   For questions or updates, please contact [place] Please consult [email or web address] for contact info under        Signed, Candee Furbish, MD  05/28/2019, 9:43 AM

## 2019-05-28 NOTE — TOC Initial Note (Signed)
Transition of Care [facility]) - Initial/Assessment Note    Patient Details  Name: Brad [name] MRN: [id number] Date of Birth: 1961-05-02  Transition of Care [facility]) CM/SW Contact:    Joaquin Courts, RN Phone Number: 05/28/2019, 3:31 PM  Clinical Narrative:   CM followed up with patient regarding prescriptions. Patient's pharmacy is closed today and tomorrow. Patient states he does not want his medications resent to another pharmacy, reports that he has medications at home to last until Monday.                   Expected Discharge Plan: Home/Self Care Barriers to Discharge: No Barriers Identified   Patient Goals and CMS Choice        Expected Discharge Plan and Services Expected Discharge Plan: Home/Self Care   Discharge Planning Services: CM Consult   Living arrangements for the past 2 months: Apartment Expected Discharge Date: 05/28/19                                    Prior Living Arrangements/Services Living arrangements for the past 2 months: Apartment Lives with:: Self Patient language and need for interpreter reviewed:: Yes Do you feel safe going back to the place where you live?: Yes      Need for Family Participation in Patient Care: No (Comment) Care giver support system in place?: No (comment)   Criminal Activity/Legal Involvement Pertinent to Current Situation/Hospitalization: No - Comment as needed  Activities of Daily Living Home Assistive Devices/Equipment: Cane (specify quad or straight)(cane not here) ADL Screening (condition at time of admission) Patient's cognitive ability adequate to safely complete daily activities?: Yes Is the patient deaf or have difficulty hearing?: No Does the patient have difficulty seeing, even when wearing glasses/contacts?: No Does the patient have difficulty concentrating, remembering, or making decisions?: No Patient able to express need for assistance with ADLs?: Yes Does the patient have difficulty  dressing or bathing?: No Independently performs ADLs?: Yes (appropriate for developmental age) Does the patient have difficulty walking or climbing stairs?: No Weakness of Legs: Both Weakness of Arms/Hands: None  Permission Sought/Granted Permission sought to share information with : Case Manager, PCP Permission granted to share information with : Yes, Verbal Permission Granted              Emotional Assessment Appearance:: Appears stated age Attitude/Demeanor/Rapport: Engaged Affect (typically observed): Accepting Orientation: : Oriented to Self, Oriented to Place, Oriented to Situation, Oriented to  Time Alcohol / Substance Use: Tobacco Use Psych Involvement: No (comment)  Admission diagnosis:  Acute on chronic congestive heart failure, unspecified heart failure type [facility]) [I50.9] Patient Active Problem List   Diagnosis Date Noted  . Elevated troponin 05/23/2019  . Tobacco abuse 05/23/2019  . CHF exacerbation ([place]) 05/23/2019  . Acute on chronic systolic (congestive) heart failure ([place]) 02/22/2019  . Acute on chronic systolic congestive heart failure ([place]) 02/21/2019  . Alcohol abuse 02/21/2019  . Frequent falls 01/17/2019  . Severe mitral regurgitation   . Fall 11/01/2018  . Hyponatremia 10/31/2018  . Abdominal distention   . Acute systolic congestive heart failure ([place])   . Chronic atrial fibrillation   . Chronic hyponatremia 08/16/2018  . NSVT (nonsustained ventricular tachycardia) ([place])   . Concussion with loss of consciousness   . Scalp laceration   . Trauma   . Permanent atrial fibrillation   . Hypertensive heart disease   . Shortness of breath   .  ACS (acute coronary syndrome) ([place]) 07/25/2018  . Pyogenic inflammation of bone ([place])   . Ankle swelling, right 04/30/2018  . Cirrhosis ([place]) 03/23/2018  . Right ankle pain 03/23/2018  . Acute respiratory failure with hypoxia ([place]) 11/06/2017  . Syncope 08/07/2017  . Acute on chronic combined systolic and  diastolic heart failure ([place]) 07/09/2017  . Atrial flutter ([place]) 07/09/2017  . Pre-syncope 07/08/2017  . Neuropathy 05/08/2017  . Substance induced mood disorder ([place]) 10/06/2016  . CVA (cerebral vascular accident) ([place]) 09/18/2016  . Left sided numbness   . Homelessness 08/21/2016  . S/P ORIF (open reduction internal fixation) fracture 08/01/2016  . CAD (coronary artery disease), native coronary artery 07/30/2016  . Surgery, elective   . Insomnia 07/22/2016  . Acute diastolic heart failure ([place])   . NSTEMI (non-ST elevated myocardial infarction) ([place])   . Normocytic anemia 07/05/2016  . Thrombocytopenia ([place]) 07/05/2016  . AKI (acute kidney injury) ([place])   . Cocaine abuse ([place]) 07/02/2016  . Chest pain on breathing 07/01/2016  . Essential hypertension 07/01/2016  . Type 2 diabetes mellitus with complication, without long-term current use of insulin ([place]) 07/01/2016  . Hypokalemia 07/01/2016  . CKD (chronic kidney disease) stage 3, GFR 30-59 ml/min (HCC) 07/01/2016  . Painful diabetic neuropathy ([place]) 07/01/2016  . Polysubstance abuse ([place]) 05/27/2016  . Chronic hepatitis C with cirrhosis ([place]) 05/27/2016  . Chronic diastolic congestive heart failure ([place]) 01/31/2016  . Depression 04/21/2012  . GERD (gastroesophageal reflux disease) 02/16/2012  . History of drug abuse ([place])   . Heroin addiction ([place]) 01/29/2012   PCP:  Charlott Rakes, MD Pharmacy:   [facility] [place], [place] - [address] [place] [address] Phone: [phone] Fax: [phone]     Social Determinants of Health (SDOH) Interventions    Readmission Risk Interventions Readmission Risk Prevention Plan 05/28/2019  Transportation Screening Complete  Medication Review ([place]) Complete  PCP or Specialist appointment within 3-5 days of discharge Complete  HRI or [place] Complete  SW Recovery Care/Counseling Consult Complete  [place] Not Applicable  Some recent data might be hidden

## 2019-05-30 ENCOUNTER — Telehealth: Payer: Self-pay

## 2019-05-30 ENCOUNTER — Other Ambulatory Visit (HOSPITAL_COMMUNITY): Payer: Self-pay

## 2019-05-30 NOTE — Telephone Encounter (Signed)
Transition Care Management Follow-up Telephone Call Date of discharge and from where:;05/28/2019.  [facility] .  Call placed to patient # [phone], message left requesting a call back # [phone]

## 2019-05-30 NOTE — Progress Notes (Signed)
Paramedicine Encounter    Patient ID: Brad [name], male    DOB: Nov 10, 1961, 58 y.o.   MRN: [id number]   Patient Care Team: Charlott Rakes, MD as PCP - General (Family Medicine) Debara Pickett Nadean Corwin, MD as PCP - Cardiology (Cardiology) Charlott Rakes, MD (Family Medicine)  Patient Active Problem List   Diagnosis Date Noted  . Elevated troponin 05/23/2019  . Tobacco abuse 05/23/2019  . CHF exacerbation ([place]) 05/23/2019  . Acute on chronic systolic (congestive) heart failure ([place]) 02/22/2019  . Acute on chronic systolic congestive heart failure ([place]) 02/21/2019  . Alcohol abuse 02/21/2019  . Frequent falls 01/17/2019  . Severe mitral regurgitation   . Fall 11/01/2018  . Hyponatremia 10/31/2018  . Abdominal distention   . Acute systolic congestive heart failure ([place])   . Chronic atrial fibrillation   . Chronic hyponatremia 08/16/2018  . NSVT (nonsustained ventricular tachycardia) ([place])   . Concussion with loss of consciousness   . Scalp laceration   . Trauma   . Permanent atrial fibrillation   . Hypertensive heart disease   . Shortness of breath   . ACS (acute coronary syndrome) ([place]) 07/25/2018  . Pyogenic inflammation of bone ([place])   . Ankle swelling, right 04/30/2018  . Cirrhosis ([place]) 03/23/2018  . Right ankle pain 03/23/2018  . Acute respiratory failure with hypoxia ([place]) 11/06/2017  . Syncope 08/07/2017  . Acute on chronic combined systolic and diastolic heart failure ([place]) 07/09/2017  . Atrial flutter ([place]) 07/09/2017  . Pre-syncope 07/08/2017  . Neuropathy 05/08/2017  . Substance induced mood disorder ([place]) 10/06/2016  . CVA (cerebral vascular accident) ([place]) 09/18/2016  . Left sided numbness   . Homelessness 08/21/2016  . S/P ORIF (open reduction internal fixation) fracture 08/01/2016  . CAD (coronary artery disease), native coronary artery 07/30/2016  . Surgery, elective   . Insomnia 07/22/2016  . Acute diastolic heart failure ([place])   . NSTEMI (non-ST  elevated myocardial infarction) ([place])   . Normocytic anemia 07/05/2016  . Thrombocytopenia ([place]) 07/05/2016  . AKI (acute kidney injury) ([place])   . Cocaine abuse ([place]) 07/02/2016  . Chest pain on breathing 07/01/2016  . Essential hypertension 07/01/2016  . Type 2 diabetes mellitus with complication, without long-term current use of insulin ([place]) 07/01/2016  . Hypokalemia 07/01/2016  . CKD (chronic kidney disease) stage 3, GFR 30-59 ml/min (HCC) 07/01/2016  . Painful diabetic neuropathy ([place]) 07/01/2016  . Polysubstance abuse ([place]) 05/27/2016  . Chronic hepatitis C with cirrhosis ([place]) 05/27/2016  . Chronic diastolic congestive heart failure ([place]) 01/31/2016  . Depression 04/21/2012  . GERD (gastroesophageal reflux disease) 02/16/2012  . History of drug abuse ([place])   . Heroin addiction ([place]) 01/29/2012    Current Outpatient Medications:  .  amitriptyline (ELAVIL) 75 MG tablet, Take 1 tablet (75 mg total) by mouth at bedtime. Needs appt for more refills., Disp: 30 tablet, Rfl: 0 .  atorvastatin (LIPITOR) 40 MG tablet, Take 1 tablet (40 mg total) by mouth daily., Disp: 90 tablet, Rfl: 3 .  carvedilol (COREG) 3.125 MG tablet, TAKE 1 TABLET (3.125 MG TOTAL) BY MOUTH 2 (TWO) TIMES DAILY WITH A MEAL. (Patient taking differently: Take 3.125 mg by mouth 2 (two) times daily with a meal. ), Disp: 60 tablet, Rfl: 2 .  cetirizine (ZYRTEC) 10 MG tablet, TAKE 1 TABLET (10 MG TOTAL) BY MOUTH DAILY., Disp: 30 tablet, Rfl: 2 .  gabapentin (NEURONTIN) 300 MG capsule, TAKE 2 CAPSULES (600 MG TOTAL) BY MOUTH 2 (TWO) TIMES DAILY., Disp: 120  capsule, Rfl: 3 .  ACCU-CHEK FASTCLIX LANCETS MISC, Use as directed three times daily (Patient not taking: Reported on 05/16/2019), Disp: 102 each, Rfl: 12 .  apixaban (ELIQUIS) 5 MG TABS tablet, Take 1 tablet (5 mg total) by mouth 2 (two) times daily. (Patient not taking: Reported on 05/30/2019), Disp: 60 tablet, Rfl: 2 .  Blood Glucose Monitoring Suppl (ACCU-CHEK GUIDE)  w/Device KIT, 1 each by Does not apply route 3 (three) times daily. (Patient not taking: Reported on 05/16/2019), Disp: 1 kit, Rfl: 0 .  EASY COMFORT PEN NEEDLES 31G X 8 MM MISC, USE AS DIRECTED TWICE A DAY (Patient not taking: Reported on 05/16/2019), Disp: 100 each, Rfl: 5 .  folic acid (FOLVITE) 1 MG tablet, Take 1 tablet (1 mg total) by mouth daily. (Patient not taking: Reported on 05/30/2019), Disp: 30 tablet, Rfl: 0 .  glucose blood (ACCU-CHEK GUIDE) test strip, Use as directed three times daily (Patient not taking: Reported on 05/16/2019), Disp: 100 each, Rfl: 12 .  hydrALAZINE (APRESOLINE) 25 MG tablet, Take 1 tablet (25 mg total) by mouth 2 (two) times a day. (Patient not taking: Reported on 05/30/2019), Disp: 90 tablet, Rfl: 0 .  hydrocortisone 2.5 % cream, Apply 1 application topically 2 (two) times daily as needed (rash). , Disp: , Rfl: 5 .  isosorbide mononitrate (IMDUR) 60 MG 24 hr tablet, Take 1 tablet (60 mg total) by mouth daily. (Patient not taking: Reported on 05/30/2019), Disp: 30 tablet, Rfl: 0 .  losartan (COZAAR) 25 MG tablet, Take 25 mg by mouth daily., Disp: , Rfl:  .  pantoprazole (PROTONIX) 40 MG tablet, Take 1 tablet (40 mg total) by mouth daily. (Patient not taking: Reported on 05/30/2019), Disp: 30 tablet, Rfl: 0 .  polyethylene glycol (MIRALAX / GLYCOLAX) 17 g packet, Take 17 g by mouth daily as needed. (Patient not taking: Reported on 05/30/2019), Disp: 14 each, Rfl: 0 .  potassium chloride 20 MEQ TBCR, Take 20 mEq by mouth daily. (Patient not taking: Reported on 05/30/2019), Disp: 90 tablet, Rfl: 0 .  thiamine 100 MG tablet, Take 1 tablet (100 mg total) by mouth daily. (Patient not taking: Reported on 05/30/2019), Disp: 30 tablet, Rfl: 0 .  torsemide (DEMADEX) 20 MG tablet, Take 3 tablets (60 mg total) by mouth every morning AND 2 tablets (40 mg total) at bedtime. (Patient not taking: Reported on 05/30/2019), Disp: 150 tablet, Rfl: 2 Allergies  Allergen Reactions  . Angiotensin Receptor  Blockers Anaphylaxis and Other (See Comments)    (Angioedema also with Lisinopril, therefore ARB's are contraindicated)  . Lisinopril Anaphylaxis and Other (See Comments)    Throat swells  . Pamelor [Nortriptyline Hcl] Anaphylaxis and Swelling    Throat swells     Social History   Socioeconomic History  . Marital status: Married    Spouse name: Not on file  . Number of children: 3  . Years of education: 2y college  . Highest education level: Not on file  Occupational History  . 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 Financial resource strain: Not on file  . Food insecurity:    Worry: Not on file    Inability: Not on file  . Transportation needs:    Medical: Not on file    Non-medical: Not on file  Tobacco Use  . Smoking status: Current Every Day Smoker    Packs/day: 0.50    Years: 28.00    Pack years:  14.00    Types: Cigarettes  . Smokeless tobacco: Never Used  Substance and Sexual Activity  . Alcohol use: Yes    Alcohol/week: 7.0 standard drinks    Types: 7 Cans of beer per week    Comment: "I drink ~ 40oz beer/day"  . Drug use: Yes    Types: IV, Cocaine, Heroin    Comment: 08/17/2018 "no heroin since 2013; I use cocaine ~ once/month, last use 03/21/2019  . Sexual activity: Not Currently  Lifestyle  . Physical activity:    Days per week: Not on file    Minutes per session: Not on file  . Stress: Not on file  Relationships  . Social connections:    Talks on phone: Not on file    Gets together: Not on file    Attends religious service: Not on file    Active member of club or organization: Not on file    Attends meetings of clubs or organizations: Not on file    Relationship status: Not on file  . Intimate partner violence:    Fear of current or ex partner: Not on file    Emotionally abused: Not on file    Physically abused: Not on file    Forced sexual activity: Not on file  Other Topics Concern  . Not on file   Social History Narrative   ** Merged History Encounter **       Incarcerated from 2006-2010, then 10/2011-12/2011.  Has been trying to get sober (no heroin, alcohol since 10/2011).     Physical Exam Neurological:     Mental Status: He is alert.     Arrived for home visit for Lynard, who is currently staying at [facility] on [address]. Stevan walked outside to my car without difficulty.  Rodney had no reports of pain, difficulty breathing, dizziness or issues with his bowels or abdomen. Keymani admits that he relapsed on Cocaine on 5/30 and was sent to the hospital on 5/1 and was admitted for 5 days. Chun states he has not used Cocaine since 5/30. Holbert reports not having all of his medications and needs emergency refills due to them being at "[facility]" house.Medications were reviewed with [name] over the phone and confirmed by AVS and Epic list.  Pills that he did have were verified and placed in pill box. List was made of medications missing that were needed and same were called into [facility]. Vitals were obtained and reviewed. Talbot reports eating food daily and drinking limited fluids since hospital discharge. Azarian agreed to take medications once he received them from [facility] along with medications in his pill box until next week when I come back to place all appropriate medications in pill box. Lydell understood and agreed to same. Damacio was made aware of appointments coming up next week. Harlan agreed to visit next Monday. Visit complete.    CBG- 213  Medications Ordered: Eliquis Torsemide Potassium Losartan Isosorbide Hydralazine Hydrocortizone Cream  New Meds being delivered today: Folic Acid Pantoprazole  Thiamin Miralax   Future Appointments  Date Time Provider [place]  06/06/2019 10:50 AM Charlott Rakes, MD [facility] None  06/13/2019  2:30 PM [facility] PA/NP [facility] None     ACTION: Home visit completed Next visit  planned for one week

## 2019-05-30 NOTE — Telephone Encounter (Signed)
From discharge call.    The medications were reviewed when [name], EMT was present with patient. He has a PCP  appointment scheduled for 06/06/2019. If it is a tele-visit, Nira Conn can be with him for the call.  He does not have all of his medications.   He is still waiting for delivery of new medications: folic acid, pantoprazole, polyethylene glycol, thiamine, hydralazine and isosorbide.  [facility] was not delivering over the weekend as per patient.  He was also  missing: apixaban, losartan, potassium chloride and torsemide and [name] will contact [facility] for refills of these medications.   He does not have insulin and it is not listed on his AVS.  The provider's  discharge summary states to resume home regime.   Blood sugar today 213 which he stated was due to eating chocolate. Informed her that Dr Margarita Rana would be notified of lack of insulin order

## 2019-05-30 NOTE — Telephone Encounter (Signed)
Transition Care Management Follow-up Telephone Call  Date of discharge and from where: 05/28/2019, [facility]  How have you been since you were released from the hospital? He stated that he is feeling " okay."  He said that he recently spoke to his 60 yo son who just graduated from high school and he also spoke to his 31 yo son in [place] and received pictures of both boys.   Any questions or concerns? His only concern was obtaining his medications.   He said that he called [employer] about housing and was told that he " moved up " on the list but they did not tell him about the length of the list.  He also spoke about using cocaine and that he is aware of the need to stop or he may end up dead if he uses again.    Items Reviewed:  Did the pt receive and understand the discharge instructions provided? He said that he has the instructions, no questions at this time   Medications obtained and verified? He does not have all of his medications.  This CM reviewed the medication list with [name], EMT when she was with the patient this afternoon. He is still waiting for delivery of new medications: folic acid, pantoprazole, polyethylene glycol, thiamine, hydralazine and isosorbide.  [facility] was not delivering over the weekend as per patient.  He was instructed to stop taking the sildenafil and spironolactone. He has amitriptyline, atorvastatin, carvedilol, cetirizine, gabapentin. Nira Conn will fill his medication box with the medications he  has on hand and she will instruct him to take the medications that are delivered according to  the bottle instructions. The patient is missing: apixaban, losartan, potassium chloride and torsemide and [name] will contact [place] for refills of these medications.  He does not have insulin listed on his AVS and discharge summary states to resume home regime.  As per Nira Conn, he has not had it in his home during her last few  visits. Blood sugar today 213 which he stated was due to eating chocolate recently.   Any new allergies since your discharge? None reported   Dietary orders reviewed? 1.2L  Fluid restriction discussed and he said that he understood  Do you have support at home? Staying with his girlfriend, at [facility].   Other (ie: DME, Home Health, etc) no home health ordered.  He has community paramedic that visits weekly.  He does not have a glucometer.  He has a scale, instructed him to weigh daily and keep a log of results.   Functional Questionnaire: (I = Independent and D = Dependent) ADL's: independent. Has been ambulating without his cane   Follow up appointments reviewed:    PCP Hospital f/u appt confirmed? Appointment scheduled for 06/06/2019 @ 1050. Informed him that he would be notified if televisit or in person.  Nira Conn can be with him for televisit if needed.   Specialist appointments confirmed? cardiology - 06/08/2019 @1430 .   Are transportation arrangements needed?he will need to take the bus  If their condition worsens, is the pt aware to call  their PCP or go to the ED? yes  Was the patient provided with contact number for the PCP, clinic?  Yes, he has the phone number  Was the pt encouraged to call back with questions or concerns?yes

## 2019-05-31 NOTE — Telephone Encounter (Signed)
Thanks for the update. Will review blood sugar logs at visit and determine if insulin is needed as he is doing well off meds with an a1c of 7.3

## 2019-06-02 ENCOUNTER — Other Ambulatory Visit (HOSPITAL_COMMUNITY): Payer: Self-pay | Admitting: Cardiology

## 2019-06-06 ENCOUNTER — Encounter (HOSPITAL_COMMUNITY): Payer: Self-pay | Admitting: Emergency Medicine

## 2019-06-06 ENCOUNTER — Telehealth (HOSPITAL_COMMUNITY): Payer: Self-pay

## 2019-06-06 ENCOUNTER — Emergency Department (HOSPITAL_COMMUNITY): Payer: Medicaid Other

## 2019-06-06 ENCOUNTER — Encounter: Payer: Self-pay | Admitting: Family Medicine

## 2019-06-06 ENCOUNTER — Other Ambulatory Visit: Payer: Self-pay

## 2019-06-06 ENCOUNTER — Other Ambulatory Visit (HOSPITAL_COMMUNITY): Payer: Self-pay

## 2019-06-06 ENCOUNTER — Ambulatory Visit (HOSPITAL_BASED_OUTPATIENT_CLINIC_OR_DEPARTMENT_OTHER): Payer: Medicaid Other | Admitting: Family Medicine

## 2019-06-06 ENCOUNTER — Telehealth: Payer: Self-pay

## 2019-06-06 ENCOUNTER — Inpatient Hospital Stay (HOSPITAL_COMMUNITY)
Admission: EM | Admit: 2019-06-06 | Discharge: 2019-06-10 | DRG: 291 | Disposition: A | Discharge status: Home / Self Care | Payer: Medicaid Other | Attending: Internal Medicine | Admitting: Internal Medicine

## 2019-06-06 DIAGNOSIS — R079 Chest pain, unspecified: Secondary | ICD-10-CM

## 2019-06-06 DIAGNOSIS — E875 Hyperkalemia: Secondary | ICD-10-CM

## 2019-06-06 DIAGNOSIS — I4821 Permanent atrial fibrillation: Secondary | ICD-10-CM | POA: Diagnosis present

## 2019-06-06 DIAGNOSIS — N183 Chronic kidney disease, stage 3 unspecified: Secondary | ICD-10-CM

## 2019-06-06 DIAGNOSIS — B182 Chronic viral hepatitis C: Secondary | ICD-10-CM | POA: Diagnosis present

## 2019-06-06 DIAGNOSIS — I440 Atrioventricular block, first degree: Secondary | ICD-10-CM | POA: Diagnosis present

## 2019-06-06 DIAGNOSIS — I2510 Atherosclerotic heart disease of native coronary artery without angina pectoris: Secondary | ICD-10-CM | POA: Diagnosis present

## 2019-06-06 DIAGNOSIS — R531 Weakness: Secondary | ICD-10-CM | POA: Diagnosis present

## 2019-06-06 DIAGNOSIS — F17210 Nicotine dependence, cigarettes, uncomplicated: Secondary | ICD-10-CM | POA: Diagnosis present

## 2019-06-06 DIAGNOSIS — I4892 Unspecified atrial flutter: Secondary | ICD-10-CM | POA: Diagnosis present

## 2019-06-06 DIAGNOSIS — M25571 Pain in right ankle and joints of right foot: Secondary | ICD-10-CM

## 2019-06-06 DIAGNOSIS — D649 Anemia, unspecified: Secondary | ICD-10-CM | POA: Diagnosis present

## 2019-06-06 DIAGNOSIS — I5043 Acute on chronic combined systolic (congestive) and diastolic (congestive) heart failure: Secondary | ICD-10-CM | POA: Diagnosis present

## 2019-06-06 DIAGNOSIS — I509 Heart failure, unspecified: Secondary | ICD-10-CM | POA: Diagnosis not present

## 2019-06-06 DIAGNOSIS — I480 Paroxysmal atrial fibrillation: Secondary | ICD-10-CM | POA: Diagnosis present

## 2019-06-06 DIAGNOSIS — F1010 Alcohol abuse, uncomplicated: Secondary | ICD-10-CM | POA: Diagnosis present

## 2019-06-06 DIAGNOSIS — R410 Disorientation, unspecified: Secondary | ICD-10-CM

## 2019-06-06 DIAGNOSIS — E785 Hyperlipidemia, unspecified: Secondary | ICD-10-CM | POA: Diagnosis present

## 2019-06-06 DIAGNOSIS — E1140 Type 2 diabetes mellitus with diabetic neuropathy, unspecified: Secondary | ICD-10-CM | POA: Diagnosis present

## 2019-06-06 DIAGNOSIS — E1165 Type 2 diabetes mellitus with hyperglycemia: Secondary | ICD-10-CM | POA: Diagnosis present

## 2019-06-06 DIAGNOSIS — R0789 Other chest pain: Secondary | ICD-10-CM | POA: Diagnosis present

## 2019-06-06 DIAGNOSIS — I130 Hypertensive heart and chronic kidney disease with heart failure and stage 1 through stage 4 chronic kidney disease, or unspecified chronic kidney disease: Secondary | ICD-10-CM | POA: Diagnosis present

## 2019-06-06 DIAGNOSIS — F1910 Other psychoactive substance abuse, uncomplicated: Secondary | ICD-10-CM

## 2019-06-06 DIAGNOSIS — G8929 Other chronic pain: Secondary | ICD-10-CM

## 2019-06-06 DIAGNOSIS — Z79899 Other long term (current) drug therapy: Secondary | ICD-10-CM

## 2019-06-06 DIAGNOSIS — E7800 Pure hypercholesterolemia, unspecified: Secondary | ICD-10-CM | POA: Diagnosis present

## 2019-06-06 DIAGNOSIS — Z791 Long term (current) use of non-steroidal anti-inflammatories (NSAID): Secondary | ICD-10-CM

## 2019-06-06 DIAGNOSIS — Z9114 Patient's other noncompliance with medication regimen: Secondary | ICD-10-CM

## 2019-06-06 DIAGNOSIS — I428 Other cardiomyopathies: Secondary | ICD-10-CM | POA: Diagnosis present

## 2019-06-06 DIAGNOSIS — F1410 Cocaine abuse, uncomplicated: Secondary | ICD-10-CM | POA: Diagnosis present

## 2019-06-06 DIAGNOSIS — N179 Acute kidney failure, unspecified: Secondary | ICD-10-CM | POA: Diagnosis present

## 2019-06-06 DIAGNOSIS — I5023 Acute on chronic systolic (congestive) heart failure: Secondary | ICD-10-CM

## 2019-06-06 DIAGNOSIS — Z794 Long term (current) use of insulin: Secondary | ICD-10-CM

## 2019-06-06 DIAGNOSIS — I252 Old myocardial infarction: Secondary | ICD-10-CM

## 2019-06-06 DIAGNOSIS — E871 Hypo-osmolality and hyponatremia: Secondary | ICD-10-CM | POA: Diagnosis present

## 2019-06-06 DIAGNOSIS — K7460 Unspecified cirrhosis of liver: Secondary | ICD-10-CM | POA: Diagnosis present

## 2019-06-06 DIAGNOSIS — K7200 Acute and subacute hepatic failure without coma: Secondary | ICD-10-CM | POA: Diagnosis present

## 2019-06-06 DIAGNOSIS — Z7984 Long term (current) use of oral hypoglycemic drugs: Secondary | ICD-10-CM

## 2019-06-06 DIAGNOSIS — F1490 Cocaine use, unspecified, uncomplicated: Secondary | ICD-10-CM | POA: Diagnosis not present

## 2019-06-06 DIAGNOSIS — Z1159 Encounter for screening for other viral diseases: Secondary | ICD-10-CM | POA: Diagnosis not present

## 2019-06-06 DIAGNOSIS — Z8546 Personal history of malignant neoplasm of prostate: Secondary | ICD-10-CM

## 2019-06-06 DIAGNOSIS — E1122 Type 2 diabetes mellitus with diabetic chronic kidney disease: Secondary | ICD-10-CM

## 2019-06-06 DIAGNOSIS — Z8249 Family history of ischemic heart disease and other diseases of the circulatory system: Secondary | ICD-10-CM

## 2019-06-06 DIAGNOSIS — Z7901 Long term (current) use of anticoagulants: Secondary | ICD-10-CM

## 2019-06-06 DIAGNOSIS — K7682 Hepatic encephalopathy: Secondary | ICD-10-CM

## 2019-06-06 DIAGNOSIS — K7290 Hepatic failure, unspecified without coma: Secondary | ICD-10-CM | POA: Diagnosis present

## 2019-06-06 DIAGNOSIS — D638 Anemia in other chronic diseases classified elsewhere: Secondary | ICD-10-CM | POA: Diagnosis present

## 2019-06-06 DIAGNOSIS — E118 Type 2 diabetes mellitus with unspecified complications: Secondary | ICD-10-CM

## 2019-06-06 DIAGNOSIS — Z1211 Encounter for screening for malignant neoplasm of colon: Secondary | ICD-10-CM

## 2019-06-06 DIAGNOSIS — I10 Essential (primary) hypertension: Secondary | ICD-10-CM | POA: Diagnosis not present

## 2019-06-06 DIAGNOSIS — F1120 Opioid dependence, uncomplicated: Secondary | ICD-10-CM | POA: Diagnosis present

## 2019-06-06 DIAGNOSIS — K219 Gastro-esophageal reflux disease without esophagitis: Secondary | ICD-10-CM | POA: Diagnosis present

## 2019-06-06 DIAGNOSIS — F1911 Other psychoactive substance abuse, in remission: Secondary | ICD-10-CM | POA: Diagnosis present

## 2019-06-06 HISTORY — DX: Melena: K92.1

## 2019-06-06 HISTORY — DX: Patient's noncompliance with other medical treatment and regimen: Z91.19

## 2019-06-06 HISTORY — DX: Alcohol abuse, uncomplicated: F10.10

## 2019-06-06 HISTORY — DX: Cocaine use, unspecified, uncomplicated: F14.90

## 2019-06-06 HISTORY — DX: Chronic combined systolic (congestive) and diastolic (congestive) heart failure: I50.42

## 2019-06-06 HISTORY — DX: Other cardiomyopathies: I42.8

## 2019-06-06 HISTORY — DX: Opioid use, unspecified, uncomplicated: F11.90

## 2019-06-06 HISTORY — DX: Patient's noncompliance with other medical treatment and regimen due to unspecified reason: Z91.199

## 2019-06-06 HISTORY — DX: Unspecified cirrhosis of liver: K74.60

## 2019-06-06 HISTORY — DX: Other chronic pain: G89.29

## 2019-06-06 HISTORY — DX: Unspecified atrial flutter: I48.92

## 2019-06-06 LAB — BASIC METABOLIC PANEL
Anion gap: 10 (ref 5–15)
BUN: 18 mg/dL (ref 6–20)
CO2: 20 mmol/L — ABNORMAL LOW (ref 22–32)
Calcium: 9 mg/dL (ref 8.9–10.3)
Chloride: 97 mmol/L — ABNORMAL LOW (ref 98–111)
Creatinine, Ser: 1.56 mg/dL — ABNORMAL HIGH (ref 0.61–1.24)
GFR calc Af Amer: 56 mL/min — ABNORMAL LOW (ref >60)
GFR calc non Af Amer: 48 mL/min — ABNORMAL LOW (ref >60)
Glucose, Bld: 147 mg/dL — ABNORMAL HIGH (ref 70–99)
Potassium: 4.5 mmol/L (ref 3.5–5.1)
Sodium: 127 mmol/L — ABNORMAL LOW (ref 135–145)

## 2019-06-06 LAB — CBC
HCT: 33.7 % — ABNORMAL LOW (ref 39.0–52.0)
Hemoglobin: 11.2 g/dL — ABNORMAL LOW (ref 13.0–17.0)
MCH: 30.6 pg (ref 26.0–34.0)
MCHC: 33.2 g/dL (ref 30.0–36.0)
MCV: 92.1 fL (ref 80.0–100.0)
Platelets: 181 10*3/uL (ref 150–400)
RBC: 3.66 MIL/uL — ABNORMAL LOW (ref 4.22–5.81)
RDW: 16.3 % — ABNORMAL HIGH (ref 11.5–15.5)
WBC: 5.4 10*3/uL (ref 4.0–10.5)
nRBC: 0 % (ref 0.0–0.2)

## 2019-06-06 LAB — POCT I-STAT EG7
Acid-base deficit: 4 mmol/L — ABNORMAL HIGH (ref 0.0–2.0)
Bicarbonate: 22.8 mmol/L (ref 20.0–28.0)
Calcium, Ion: 1.2 mmol/L (ref 1.15–1.40)
HCT: 35 % — ABNORMAL LOW (ref 39.0–52.0)
Hemoglobin: 11.9 g/dL — ABNORMAL LOW (ref 13.0–17.0)
O2 Saturation: 76 %
Potassium: 4.3 mmol/L (ref 3.5–5.1)
Sodium: 130 mmol/L — ABNORMAL LOW (ref 135–145)
TCO2: 24 mmol/L (ref 22–32)
pCO2, Ven: 47.2 mmHg (ref 44.0–60.0)
pH, Ven: 7.291 (ref 7.250–7.430)
pO2, Ven: 46 mmHg — ABNORMAL HIGH (ref 32.0–45.0)

## 2019-06-06 LAB — CBG MONITORING, ED: Glucose-Capillary: 127 mg/dL — ABNORMAL HIGH (ref 70–99)

## 2019-06-06 LAB — PROTIME-INR
INR: 1.6 — ABNORMAL HIGH (ref 0.8–1.2)
Prothrombin Time: 18.5 seconds — ABNORMAL HIGH (ref 11.4–15.2)

## 2019-06-06 LAB — SARS CORONAVIRUS 2: SARS Coronavirus 2: NOT DETECTED

## 2019-06-06 LAB — HEPATIC FUNCTION PANEL
ALT: 20 U/L (ref 0–44)
AST: 21 U/L (ref 15–41)
Albumin: 3.3 g/dL — ABNORMAL LOW (ref 3.5–5.0)
Alkaline Phosphatase: 77 U/L (ref 38–126)
Bilirubin, Direct: 0.4 mg/dL — ABNORMAL HIGH (ref 0.0–0.2)
Indirect Bilirubin: 0.6 mg/dL (ref 0.3–0.9)
Total Bilirubin: 1 mg/dL (ref 0.3–1.2)
Total Protein: 8 g/dL (ref 6.5–8.1)

## 2019-06-06 LAB — ETHANOL: Alcohol, Ethyl (B): <10 mg/dL (ref <10)

## 2019-06-06 LAB — BRAIN NATRIURETIC PEPTIDE: B Natriuretic Peptide: 1314.8 pg/mL — ABNORMAL HIGH (ref 0.0–100.0)

## 2019-06-06 LAB — AMMONIA: Ammonia: 47 umol/L — ABNORMAL HIGH (ref 9–35)

## 2019-06-06 LAB — TROPONIN I: Troponin I: <0.03 ng/mL (ref <0.03)

## 2019-06-06 MED ORDER — FUROSEMIDE 10 MG/ML IJ SOLN
40.0000 mg | Freq: Once | INTRAMUSCULAR | Status: AC
  Administered 2019-06-06: 40 mg via INTRAVENOUS
  Filled 2019-06-06: qty 4

## 2019-06-06 MED ORDER — ADULT MULTIVITAMIN W/MINERALS CH
1.0000 | ORAL_TABLET | Freq: Every day | ORAL | Status: DC
  Administered 2019-06-07 – 2019-06-10 (×4): 1 via ORAL
  Filled 2019-06-06 (×4): qty 1

## 2019-06-06 MED ORDER — ACETAMINOPHEN 325 MG PO TABS
650.0000 mg | ORAL_TABLET | Freq: Four times a day (QID) | ORAL | Status: DC | PRN
  Administered 2019-06-06 – 2019-06-09 (×5): 650 mg via ORAL
  Filled 2019-06-06 (×4): qty 2

## 2019-06-06 MED ORDER — ENOXAPARIN SODIUM 40 MG/0.4ML ~~LOC~~ SOLN: 40.0000 mg | SUBCUTANEOUS | Status: DC

## 2019-06-06 MED ORDER — INSULIN ASPART 100 UNIT/ML ~~LOC~~ SOLN: 0.0000 [IU] | Freq: Every day | SUBCUTANEOUS | Status: DC

## 2019-06-06 MED ORDER — DICLOFENAC SODIUM 1 % TD GEL: 4.0000 g | Freq: Four times a day (QID) | TRANSDERMAL | 3 refills | Status: DC

## 2019-06-06 MED ORDER — LORAZEPAM 2 MG/ML IJ SOLN: 1.0000 mg | Freq: Four times a day (QID) | INTRAMUSCULAR | Status: AC | PRN

## 2019-06-06 MED ORDER — INSULIN ASPART 100 UNIT/ML ~~LOC~~ SOLN
0.0000 [IU] | Freq: Three times a day (TID) | SUBCUTANEOUS | Status: DC
  Administered 2019-06-07 – 2019-06-08 (×3): 1 [IU] via SUBCUTANEOUS
  Administered 2019-06-08 – 2019-06-09 (×3): 2 [IU] via SUBCUTANEOUS
  Administered 2019-06-10 (×2): 1 [IU] via SUBCUTANEOUS

## 2019-06-06 MED ORDER — THIAMINE HCL 100 MG/ML IJ SOLN: 100.0000 mg | Freq: Every day | INTRAMUSCULAR | Status: DC

## 2019-06-06 MED ORDER — FUROSEMIDE 10 MG/ML IJ SOLN
40.0000 mg | Freq: Two times a day (BID) | INTRAMUSCULAR | Status: DC
  Administered 2019-06-07 (×2): 40 mg via INTRAVENOUS
  Filled 2019-06-06 (×2): qty 4

## 2019-06-06 MED ORDER — NICOTINE 14 MG/24HR TD PT24
14.0000 mg | MEDICATED_PATCH | Freq: Every day | TRANSDERMAL | Status: DC
  Administered 2019-06-07 – 2019-06-10 (×4): 14 mg via TRANSDERMAL
  Filled 2019-06-06 (×4): qty 1

## 2019-06-06 MED ORDER — LANTUS SOLOSTAR 100 UNIT/ML ~~LOC~~ SOPN: 5.0000 [IU] | PEN_INJECTOR | Freq: Every day | SUBCUTANEOUS | 3 refills | Status: DC

## 2019-06-06 MED ORDER — LORAZEPAM 1 MG PO TABS: 1.0000 mg | ORAL_TABLET | Freq: Four times a day (QID) | ORAL | Status: AC | PRN

## 2019-06-06 MED ORDER — VITAMIN B-1 100 MG PO TABS
100.0000 mg | ORAL_TABLET | Freq: Every day | ORAL | Status: DC
  Administered 2019-06-07 – 2019-06-10 (×4): 100 mg via ORAL
  Filled 2019-06-06 (×4): qty 1

## 2019-06-06 MED ORDER — SODIUM CHLORIDE 0.9% FLUSH
3.0000 mL | Freq: Once | INTRAVENOUS | Status: AC
  Administered 2019-06-08: 3 mL via INTRAVENOUS

## 2019-06-06 MED ORDER — ACETAMINOPHEN 650 MG RE SUPP: 650.0000 mg | Freq: Four times a day (QID) | RECTAL | Status: DC | PRN

## 2019-06-06 MED ORDER — FOLIC ACID 1 MG PO TABS
1.0000 mg | ORAL_TABLET | Freq: Every day | ORAL | Status: DC
  Administered 2019-06-07 – 2019-06-10 (×4): 1 mg via ORAL
  Filled 2019-06-06 (×4): qty 1

## 2019-06-06 MED ORDER — LACTULOSE 10 GM/15ML PO SOLN
10.0000 g | Freq: Three times a day (TID) | ORAL | Status: DC | PRN
  Administered 2019-06-09: 10 g via ORAL
  Filled 2019-06-06: qty 15

## 2019-06-06 NOTE — ED Notes (Addendum)
Pt back from CT
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
## 2019-06-06 NOTE — Telephone Encounter (Signed)
Left voicemail with Brad [name] reminding him of his appointment today. Per Dr. Margarita Rana he is able to do televisit or come into the office. Awaiting return of his call to determine same.

## 2019-06-06 NOTE — Telephone Encounter (Signed)
He needs to go to the emergency room

## 2019-06-06 NOTE — Telephone Encounter (Signed)
Call received from [facility], EMT noting that the patient needs refills for torsemide, losartan and amitriptyline sent to [place].

## 2019-06-06 NOTE — Progress Notes (Signed)
Paramedicine Encounter    Patient ID: Brad [name], male    DOB: [birth year], 58 y.o.   MRN: [id number]   Patient Care Team: Charlott Rakes, MD as PCP - General (Family Medicine) Debara Pickett Nadean Corwin, MD as PCP - Cardiology (Cardiology) Charlott Rakes, MD (Family Medicine)  Patient Active Problem List   Diagnosis Date Noted  . Elevated troponin 05/23/2019  . Tobacco abuse 05/23/2019  . CHF exacerbation ([place]) 05/23/2019  . Acute on chronic systolic (congestive) heart failure ([place]) 02/22/2019  . Acute on chronic systolic congestive heart failure ([place]) 02/21/2019  . Alcohol abuse 02/21/2019  . Frequent falls 01/17/2019  . Severe mitral regurgitation   . Fall 11/01/2018  . Hyponatremia 10/31/2018  . Abdominal distention   . Acute systolic congestive heart failure ([place])   . Chronic atrial fibrillation   . Chronic hyponatremia 08/16/2018  . NSVT (nonsustained ventricular tachycardia) ([place])   . Concussion with loss of consciousness   . Scalp laceration   . Trauma   . Permanent atrial fibrillation   . Hypertensive heart disease   . Shortness of breath   . ACS (acute coronary syndrome) ([place]) 07/25/2018  . Pyogenic inflammation of bone ([place])   . Ankle swelling, right 04/30/2018  . Cirrhosis ([place]) 03/23/2018  . Right ankle pain 03/23/2018  . Acute respiratory failure with hypoxia ([place]) 11/06/2017  . Syncope 08/07/2017  . Acute on chronic combined systolic and diastolic heart failure ([place]) 07/09/2017  . Atrial flutter ([place]) 07/09/2017  . Pre-syncope 07/08/2017  . Neuropathy 05/08/2017  . Substance induced mood disorder ([place]) 10/06/2016  . CVA (cerebral vascular accident) ([place]) 09/18/2016  . Left sided numbness   . Homelessness 08/21/2016  . S/P ORIF (open reduction internal fixation) fracture 08/01/2016  . CAD (coronary artery disease), native coronary artery 07/30/2016  . Surgery, elective   . Insomnia 07/22/2016  . Acute diastolic heart failure ([place])   . NSTEMI (non-ST  elevated myocardial infarction) ([place])   . Normocytic anemia 07/05/2016  . Thrombocytopenia ([place]) 07/05/2016  . AKI (acute kidney injury) ([place])   . Cocaine abuse ([place]) 07/02/2016  . Chest pain on breathing 07/01/2016  . Essential hypertension 07/01/2016  . Type 2 diabetes mellitus with complication, without long-term current use of insulin ([place]) 07/01/2016  . Hypokalemia 07/01/2016  . CKD (chronic kidney disease) stage 3, GFR 30-59 ml/min (HCC) 07/01/2016  . Painful diabetic neuropathy ([place]) 07/01/2016  . Polysubstance abuse ([place]) 05/27/2016  . Chronic hepatitis C with cirrhosis ([place]) 05/27/2016  . Chronic diastolic congestive heart failure ([place]) 01/31/2016  . Depression 04/21/2012  . GERD (gastroesophageal reflux disease) 02/16/2012  . History of drug abuse ([place])   . Heroin addiction ([place]) 01/29/2012    Current Outpatient Medications:  .  ACCU-CHEK FASTCLIX LANCETS MISC, Use as directed three times daily, Disp: 102 each, Rfl: 12 .  amitriptyline (ELAVIL) 75 MG tablet, Take 1 tablet (75 mg total) by mouth at bedtime. Needs appt for more refills., Disp: 30 tablet, Rfl: 0 .  apixaban (ELIQUIS) 5 MG TABS tablet, Take 1 tablet (5 mg total) by mouth 2 (two) times daily., Disp: 60 tablet, Rfl: 2 .  atorvastatin (LIPITOR) 40 MG tablet, Take 1 tablet (40 mg total) by mouth daily., Disp: 90 tablet, Rfl: 3 .  carvedilol (COREG) 3.125 MG tablet, TAKE 1 TABLET (3.125 MG TOTAL) BY MOUTH 2 (TWO) TIMES DAILY WITH A MEAL. (Patient taking differently: Take 3.125 mg by mouth 2 (two) times daily with a meal. ), Disp: 60 tablet, Rfl: 2 .  cetirizine (ZYRTEC) 10 MG tablet, TAKE 1 TABLET (10 MG TOTAL) BY MOUTH DAILY., Disp: 30 tablet, Rfl: 2 .  folic acid (FOLVITE) 1 MG tablet, Take 1 tablet (1 mg total) by mouth daily., Disp: 30 tablet, Rfl: 0 .  gabapentin (NEURONTIN) 300 MG capsule, TAKE 2 CAPSULES (600 MG TOTAL) BY MOUTH 2 (TWO) TIMES DAILY., Disp: 120 capsule, Rfl: 3 .  isosorbide mononitrate (IMDUR) 60  MG 24 hr tablet, Take 1 tablet (60 mg total) by mouth daily., Disp: 30 tablet, Rfl: 0 .  pantoprazole (PROTONIX) 40 MG tablet, Take 1 tablet (40 mg total) by mouth daily., Disp: 30 tablet, Rfl: 0 .  potassium chloride SA (K-DUR) 20 MEQ tablet, TAKE 1 TABLET BY MOUTH DAILY., Disp: 90 tablet, Rfl: 1 .  thiamine 100 MG tablet, Take 1 tablet (100 mg total) by mouth daily., Disp: 30 tablet, Rfl: 0 .  Blood Glucose Monitoring Suppl (ACCU-CHEK GUIDE) w/Device KIT, 1 each by Does not apply route 3 (three) times daily. (Patient not taking: Reported on 06/06/2019), Disp: 1 kit, Rfl: 0 .  diclofenac sodium (VOLTAREN) 1 % GEL, Apply 4 g topically 4 (four) times daily. (Patient not taking: Reported on 06/06/2019), Disp: 100 g, Rfl: 3 .  EASY COMFORT PEN NEEDLES 31G X 8 MM MISC, USE AS DIRECTED TWICE A DAY (Patient not taking: Reported on 05/16/2019), Disp: 100 each, Rfl: 5 .  glucose blood (ACCU-CHEK GUIDE) test strip, Use as directed three times daily (Patient not taking: Reported on 05/16/2019), Disp: 100 each, Rfl: 12 .  hydrALAZINE (APRESOLINE) 25 MG tablet, Take 1 tablet (25 mg total) by mouth 2 (two) times a day. (Patient not taking: Reported on 05/30/2019), Disp: 90 tablet, Rfl: 0 .  hydrocortisone 2.5 % cream, Apply 1 application topically 2 (two) times daily as needed (rash). , Disp: , Rfl: 5 .  Insulin Glargine (LANTUS SOLOSTAR) 100 UNIT/ML Solostar Pen, Inject 5 Units into the skin daily. (Patient not taking: Reported on 06/06/2019), Disp: 5 pen, Rfl: 3 .  losartan (COZAAR) 25 MG tablet, Take 25 mg by mouth daily., Disp: , Rfl:  .  polyethylene glycol (MIRALAX / GLYCOLAX) 17 g packet, Take 17 g by mouth daily as needed. (Patient not taking: Reported on 05/30/2019), Disp: 14 each, Rfl: 0 .  torsemide (DEMADEX) 20 MG tablet, Take 3 tablets (60 mg total) by mouth every morning AND 2 tablets (40 mg total) at bedtime. (Patient not taking: Reported on 05/30/2019), Disp: 150 tablet, Rfl: 2 Allergies  Allergen Reactions   . Angiotensin Receptor Blockers Anaphylaxis and Other (See Comments)    (Angioedema also with Lisinopril, therefore ARB's are contraindicated)  . Lisinopril Anaphylaxis and Other (See Comments)    Throat swells  . Pamelor [Nortriptyline Hcl] Anaphylaxis and Swelling    Throat swells     Social History   Socioeconomic History  . Marital status: Married    Spouse name: Not on file  . Number of children: 3  . Years of education: 2y college  . Highest education level: Not on file  Occupational History  . 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 Financial resource strain: Not on file  . Food insecurity    Worry: Not on file    Inability: Not on file  . Transportation needs    Medical: Not on file    Non-medical: Not on file  Tobacco Use  . Smoking status: Current Every Day Smoker  Packs/day: 0.50    Years: 28.00    Pack years: 14.00    Types: Cigarettes  . Smokeless tobacco: Never Used  Substance and Sexual Activity  . Alcohol use: Yes    Alcohol/week: 7.0 standard drinks    Types: 7 Cans of beer per week    Comment: "I drink ~ 40oz beer/day"  . Drug use: Yes    Types: IV, Cocaine, Heroin    Comment: 08/17/2018 "no heroin since 2013; I use cocaine ~ once/month, last use 03/21/2019  . Sexual activity: Not Currently  Lifestyle  . Physical activity    Days per week: Not on file    Minutes per session: Not on file  . Stress: Not on file  Relationships  . Social Herbalist on phone: Not on file    Gets together: Not on file    Attends religious service: Not on file    Active member of club or organization: Not on file    Attends meetings of clubs or organizations: Not on file    Relationship status: Not on file  . Intimate partner violence    Fear of current or ex partner: Not on file    Emotionally abused: Not on file    Physically abused: Not on file    Forced sexual activity: Not on file  Other Topics Concern   . Not on file  Social History Narrative   ** Merged History Encounter **       Incarcerated from 2006-2010, then 10/2011-12/2011.  Has been trying to get sober (no heroin, alcohol since 10/2011).     Physical Exam Constitutional:      Comments: Drowsy   HENT:     Head: Normocephalic.     Nose: Nose normal.     Mouth/Throat:     Mouth: Mucous membranes are moist.  Neck:     Musculoskeletal: Normal range of motion.  Cardiovascular:     Rate and Rhythm: Normal rate and regular rhythm.     Pulses: Normal pulses.     Heart sounds: Normal heart sounds.  Pulmonary:     Effort: Pulmonary effort is normal.     Breath sounds: Normal breath sounds.  Musculoskeletal: Normal range of motion.     Right lower leg: No edema.     Left lower leg: No edema.  Skin:    General: Skin is warm and dry.     Capillary Refill: Capillary refill takes less than 2 seconds.  Neurological:     Comments: Sluggish to respond.      Arrived on scene for home visit with Mr. Gahm to find him slumped over sitting up sleeping. Mr. Turman appeared sluggish and slow to respond. His girlfriend was present and stated for the last two days he has been "lethargic" and has fallen twice in the last 24 hours. Patient denied hitting his head or neck or back pain. Patient complained of left flank pain with dark, foul smelling urine and stated he urinates and feels like he isn't finished urinating. Patient was alert and oriented but sluggish on assessment which is abnormal for him. Stroke screen was preformed and was negative. Baseline vitals were obtained and are as recorded. Patient was afebrile on assessment. Patient reports taking his medications and his girlfriend agreed stating she ensured that he got his medicines "even the new ones". I contacted [name] and she spoke with Dr. Margarita Rana who advised Lee to go to ED if he was not feeling  wellGwyndolyn Saxon agreed to EMS transport to [facility]. Patient care was transferred to [facility].  Namish agreed to visit next Monday. (clinic visit)   Medication Ordered: - Amtriptyline - Hydralazine - Losartan - Torsemide   CBG- 157  Future Appointments  Date Time Provider [place]  06/07/2019 11:05 AM [facility] LAB [facility] None  06/13/2019  2:30 PM [facility] PA/NP [facility] None     ACTION: Home visit completed Next visit planned for one week

## 2019-06-06 NOTE — Telephone Encounter (Signed)
Call received from [name], EMT when she was with the patient.  She said that his friend, Lorriane Shire, was with him and he was complaining of left flank pain and has been very drowsy. He is oriented to person but not readily oriented to date. he denies drug use. he said that he has experienced urinary frequency. T  98.7, p 83, BP 110/60, O2 97%. CBG 157.  His friend reports that he is sleeping all the time and mental status altered x few days.   This information was shared with Dr Margarita Rana who noted that there was no mention of altered sensorium during her call with him this morning.  She ordered labs for him to have done tomorrow.   Update provided to [facility] who stated that he continues to be very drowsy and his friend reported that he has fallen a couple of times in the past few days. Nira Conn did a stroke screen and that was negative.  She has contacted EMS to take him to the ED for evaluation.
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
## 2019-06-06 NOTE — ED Notes (Signed)
Pt is having 10/10 CP. Spoke with admitting provider, feels as though it is non cardiac. EKG done, troponin pending, tylenol given per provider. Receiving floor notified of the same.

## 2019-06-06 NOTE — Telephone Encounter (Signed)
Dimitris called to confirm appointment via telephone at 1050 with Dr. Margarita Rana, he advised staff call [phone] which is his girlfriend's phone because his phone is hard to "swipe" to answer.

## 2019-06-07 ENCOUNTER — Other Ambulatory Visit: Payer: Self-pay

## 2019-06-07 ENCOUNTER — Other Ambulatory Visit: Payer: Medicaid Other

## 2019-06-07 DIAGNOSIS — R079 Chest pain, unspecified: Secondary | ICD-10-CM

## 2019-06-07 DIAGNOSIS — K7200 Acute and subacute hepatic failure without coma: Secondary | ICD-10-CM

## 2019-06-07 DIAGNOSIS — I10 Essential (primary) hypertension: Secondary | ICD-10-CM

## 2019-06-07 DIAGNOSIS — D649 Anemia, unspecified: Secondary | ICD-10-CM

## 2019-06-07 DIAGNOSIS — K7682 Hepatic encephalopathy: Secondary | ICD-10-CM

## 2019-06-07 DIAGNOSIS — E7800 Pure hypercholesterolemia, unspecified: Secondary | ICD-10-CM

## 2019-06-07 LAB — URINALYSIS, MICROSCOPIC (REFLEX)

## 2019-06-07 LAB — CBC
HCT: 30.9 % — ABNORMAL LOW (ref 39.0–52.0)
Hemoglobin: 10.6 g/dL — ABNORMAL LOW (ref 13.0–17.0)
MCH: 30.8 pg (ref 26.0–34.0)
MCHC: 34.3 g/dL (ref 30.0–36.0)
MCV: 89.8 fL (ref 80.0–100.0)
Platelets: 164 10*3/uL (ref 150–400)
RBC: 3.44 MIL/uL — ABNORMAL LOW (ref 4.22–5.81)
RDW: 16.1 % — ABNORMAL HIGH (ref 11.5–15.5)
WBC: 4.6 10*3/uL (ref 4.0–10.5)
nRBC: 0 % (ref 0.0–0.2)

## 2019-06-07 LAB — URINALYSIS, ROUTINE W REFLEX MICROSCOPIC
Bilirubin Urine: NEGATIVE
Glucose, UA: 100 mg/dL — AB
Hgb urine dipstick: NEGATIVE
Ketones, ur: NEGATIVE mg/dL
Nitrite: NEGATIVE
Protein, ur: 100 mg/dL — AB
Specific Gravity, Urine: 1.02 (ref 1.005–1.030)
pH: 5.5 (ref 5.0–8.0)

## 2019-06-07 LAB — BASIC METABOLIC PANEL
Anion gap: 12 (ref 5–15)
BUN: 19 mg/dL (ref 6–20)
CO2: 17 mmol/L — ABNORMAL LOW (ref 22–32)
Calcium: 8.8 mg/dL — ABNORMAL LOW (ref 8.9–10.3)
Chloride: 98 mmol/L (ref 98–111)
Creatinine, Ser: 1.67 mg/dL — ABNORMAL HIGH (ref 0.61–1.24)
GFR calc Af Amer: 51 mL/min — ABNORMAL LOW (ref >60)
GFR calc non Af Amer: 44 mL/min — ABNORMAL LOW (ref >60)
Glucose, Bld: 169 mg/dL — ABNORMAL HIGH (ref 70–99)
Potassium: 3.9 mmol/L (ref 3.5–5.1)
Sodium: 127 mmol/L — ABNORMAL LOW (ref 135–145)

## 2019-06-07 LAB — GLUCOSE, CAPILLARY
Glucose-Capillary: 140 mg/dL — ABNORMAL HIGH (ref 70–99)
Glucose-Capillary: 139 mg/dL — ABNORMAL HIGH (ref 70–99)
Glucose-Capillary: 120 mg/dL — ABNORMAL HIGH (ref 70–99)
Glucose-Capillary: 146 mg/dL — ABNORMAL HIGH (ref 70–99)

## 2019-06-07 LAB — PROTIME-INR
INR: 1.5 — ABNORMAL HIGH (ref 0.8–1.2)
Prothrombin Time: 17.9 seconds — ABNORMAL HIGH (ref 11.4–15.2)

## 2019-06-07 LAB — RETICULOCYTES
Immature Retic Fract: 22.3 % — ABNORMAL HIGH (ref 2.3–15.9)
RBC.: 3.44 MIL/uL — ABNORMAL LOW (ref 4.22–5.81)
Retic Count, Absolute: 99.8 10*3/uL (ref 19.0–186.0)
Retic Ct Pct: 2.9 % (ref 0.4–3.1)

## 2019-06-07 LAB — RAPID URINE DRUG SCREEN, HOSP PERFORMED
Amphetamines: NOT DETECTED
Barbiturates: NOT DETECTED
Benzodiazepines: NOT DETECTED
Cocaine: POSITIVE — AB
Opiates: NOT DETECTED
Tetrahydrocannabinol: NOT DETECTED

## 2019-06-07 LAB — IRON AND TIBC
Iron: 58 ug/dL (ref 45–182)
Saturation Ratios: 18 % (ref 17.9–39.5)
TIBC: 326 ug/dL (ref 250–450)
UIBC: 268 ug/dL

## 2019-06-07 LAB — TROPONIN I
Troponin I: <0.03 ng/mL (ref <0.03)
Troponin I: <0.03 ng/mL (ref <0.03)
Troponin I: 0.03 ng/mL (ref <0.03)

## 2019-06-07 LAB — FOLATE: Folate: 15 ng/mL (ref >5.9)

## 2019-06-07 LAB — FERRITIN: Ferritin: 177 ng/mL (ref 24–336)

## 2019-06-07 LAB — VITAMIN B12: Vitamin B-12: 344 pg/mL (ref 180–914)

## 2019-06-07 LAB — PROCALCITONIN: Procalcitonin: <0.1 ng/mL

## 2019-06-07 MED ORDER — LORAZEPAM 2 MG/ML IJ SOLN: 2.0000 mg | INTRAMUSCULAR | Status: DC | PRN

## 2019-06-07 MED ORDER — ISOSORBIDE MONONITRATE ER 60 MG PO TB24: 60.0000 mg | ORAL_TABLET | Freq: Every day | ORAL | Status: DC

## 2019-06-07 MED ORDER — GABAPENTIN 600 MG PO TABS
600.0000 mg | ORAL_TABLET | Freq: Two times a day (BID) | ORAL | Status: DC
  Administered 2019-06-07 – 2019-06-10 (×7): 600 mg via ORAL
  Filled 2019-06-07 (×7): qty 1

## 2019-06-07 MED ORDER — LORATADINE 10 MG PO TABS
10.0000 mg | ORAL_TABLET | Freq: Every day | ORAL | Status: DC
  Administered 2019-06-07 – 2019-06-10 (×4): 10 mg via ORAL
  Filled 2019-06-07 (×4): qty 1

## 2019-06-07 MED ORDER — NITROGLYCERIN 0.4 MG SL SUBL
0.4000 mg | SUBLINGUAL_TABLET | SUBLINGUAL | Status: DC | PRN
  Administered 2019-06-07 – 2019-06-08 (×6): 0.4 mg via SUBLINGUAL
  Filled 2019-06-07 (×2): qty 1

## 2019-06-07 MED ORDER — INSULIN GLARGINE 100 UNIT/ML ~~LOC~~ SOLN
5.0000 [IU] | Freq: Every day | SUBCUTANEOUS | Status: DC
  Administered 2019-06-07 – 2019-06-10 (×4): 5 [IU] via SUBCUTANEOUS
  Filled 2019-06-07 (×4): qty 0.05

## 2019-06-07 MED ORDER — HYDRALAZINE HCL 25 MG PO TABS: 25.0000 mg | ORAL_TABLET | Freq: Two times a day (BID) | ORAL | Status: DC

## 2019-06-07 MED ORDER — GABAPENTIN 300 MG PO CAPS: 300.0000 mg | ORAL_CAPSULE | Freq: Two times a day (BID) | ORAL | Status: DC

## 2019-06-07 MED ORDER — VITAMIN B-1 100 MG PO TABS: 100.0000 mg | ORAL_TABLET | Freq: Every day | ORAL | Status: DC

## 2019-06-07 MED ORDER — APIXABAN 5 MG PO TABS
5.0000 mg | ORAL_TABLET | Freq: Two times a day (BID) | ORAL | Status: DC
  Administered 2019-06-07 – 2019-06-10 (×7): 5 mg via ORAL
  Filled 2019-06-07 (×7): qty 1

## 2019-06-07 MED ORDER — PANTOPRAZOLE SODIUM 40 MG PO TBEC
40.0000 mg | DELAYED_RELEASE_TABLET | Freq: Every day | ORAL | Status: DC
  Administered 2019-06-07 – 2019-06-10 (×4): 40 mg via ORAL
  Filled 2019-06-07 (×3): qty 1
  Filled 2019-06-07: qty 2

## 2019-06-07 MED ORDER — CARVEDILOL 3.125 MG PO TABS
3.1250 mg | ORAL_TABLET | Freq: Two times a day (BID) | ORAL | Status: DC
  Administered 2019-06-07 – 2019-06-09 (×5): 3.125 mg via ORAL
  Filled 2019-06-07 (×5): qty 1

## 2019-06-07 MED ORDER — GABAPENTIN 300 MG PO CAPS
600.0000 mg | ORAL_CAPSULE | Freq: Once | ORAL | Status: AC
  Administered 2019-06-07: 600 mg via ORAL
  Filled 2019-06-07: qty 2

## 2019-06-07 MED ORDER — FOLIC ACID 1 MG PO TABS: 1.0000 mg | ORAL_TABLET | Freq: Every day | ORAL | Status: DC

## 2019-06-07 MED ORDER — ATORVASTATIN CALCIUM 40 MG PO TABS
40.0000 mg | ORAL_TABLET | Freq: Every day | ORAL | Status: DC
  Administered 2019-06-07 – 2019-06-09 (×3): 40 mg via ORAL
  Filled 2019-06-07 (×3): qty 1

## 2019-06-07 NOTE — Progress Notes (Signed)
Patient states feeling shortness of breath . Placed on oxygen nasal canula at 2.5L. Sherral Hammers MD at bedside.

## 2019-06-07 NOTE — Progress Notes (Signed)
Patient states that is having chest pain 9 out of 10. EKG completed and placed in patients chart.  Vitals are as follows:   06/07/19 1256  Vitals  BP (!) 128/92  MAP (mmHg) 99  BP Method Automatic  Pulse Rate 60  Pulse Rate Source Monitor  Oxygen Therapy  SpO2 93 % room air.  MEWS Score  MEWS RR 0  MEWS Pulse 0  MEWS Systolic 0  MEWS LOC 0  MEWS Temp 0  MEWS Score 0  MEWS Score Color Green   Patient states that only feels short of breath when laying down during the EKG.  Nurse called [facility] to check with them if there were any changes in the monitor, no changes per [facility]. Per night shift nurse patient stated that felt chest pain at 0133 and that gabapentin was administered and pain was resolved.  Currently there is no NTG PRN. Sherral Hammers MD paged to make aware.  Patient is currently sitting in bed watching television.  Will continue to monitor patient.

## 2019-06-07 NOTE — Telephone Encounter (Signed)
His doses might be adjusted during hospitalization.  We will hold off until he gets discharged

## 2019-06-07 NOTE — Progress Notes (Signed)
PROGRESS NOTE    Brad [name]  HCW:[id number] DOB: [birth year] DOA: 06/06/2019 PCP: Charlott Rakes, MD   Brief Narrative:  58 y.o. BM PMHx HTN, paroxysmal A. fib/flutter,  chronic combined systolic and diastolic congestive heart failure/nonischemic cardiomyopathy, CKD stage III, type 2 diabetes, hepatitis C and cirrhosis, HLD, IV drug abuse ,prostate cancer, obese    and conditions listed below presenting to the hospital for evaluation of generalized weakness and chest pain.  Patient reports having generalized weakness, dry cough, shortness of breath, orthopnea, and bilateral lower extremity edema for the past few days.  States he takes a fluid pill at home but is not sure which one.  States he had previously stopped using IV drugs but relapsed 3 weeks ago.  He last used cocaine yesterday.  This morning around 7 AM he woke up with substernal pressure-like chest pain which radiated to the left side of his chest and left arm.  The chest pain lasted the entire day and finally resolved after he reached the ED.  Chest pain was associated with shortness of breath and diaphoresis.  Not associated with nausea.  Denies any fevers or chills.  States he drinks 2 beers every day, last drink this afternoon.  He smokes 1/2 pack of cigarettes daily.  ED Course: Vital signs stable on arrival.  No leukocytosis.  Hemoglobin 11.9, was 11.8 ten days ago.  Corrected sodium 128, chronically low.  Creatinine 1.5, similar to recent baseline.  Ammonia level mildly elevated at 47.  LFTs normal. Blood ethanol level negative.  BNP 1314.  UA, urine culture, UDS, and COVID-19 rapid test pending.  Chest x-ray with findings consistent with CHF with developing pulmonary edema including small bilateral pleural effusions.  Also showing a few scattered bilateral airspace opacities which are favored to represent atelectasis versus sequela of pulmonary edema.  Head CT negative for acute finding.  CT C-spine negative for acute finding.   EKG with new T wave inversions in lead III and aVF.  Troponin negative.  Received IV Lasix 40 mg in the ED.  Subjective: A/O x4, negative S OB, states positive CP rated 5/10 (s/p 3 times NTG tabs), patient sitting comfortably in bed, positive radiation of pain LEFT shoulder and arm.  Negative abdominal pain, negative N/V, negative diaphoresis   Assessment & Plan:   Principal Problem:   Acute exacerbation of CHF (congestive heart failure) (HCC) Active Problems:   Anemia   Hyponatremia   Chest pain   Acute hepatic encephalopathy  Acute on chronic systolic and diastolic CHF - Exacerbated by multiple causes cocaine use, noncompliance with medication - Patient does not know his base weight, does not weigh himself daily even though just recently hospitalized. -Strict in and out -Daily weight - Coreg 3.125 mg twice daily its alpha blockade least likely beta-blocker to cause problems with patient's continued cocaine use   Chest pain - Not likely ACS, most likely related to CHF + cocaine use - Per admission note EKG with questionable new T wave inversion in V3 and aVF, however repeat EKG showed resolution of T wave inversion - Trend troponin Recent Labs  Lab 06/06/19 2112 06/06/19 2310 06/07/19 0536 06/07/19 1135  TROPONINI <0.03 <0.03 <0.03 0.03*   Essential HTN -See CHF - Controlled  Paroxysmal atrial fibrillation - Eliquis 5 mg twice daily - Currently NSR  HLD - Lipitor 40 mg daily -Lipid panel pending   Hepatic encephalopathy -Resolved    Normocytic anemia Hemoglobin 11.9, was 11.8 ten days ago.  -Anemia panel  pending -No signs or symptoms of acute bleed continue to monitor CBC   Chronic hyponatremia in the setting of liver cirrhosis - Corrected sodium 128; chronically low - Fluid restrict.  Would not further diuresis as patient is asymptomatic and increasing CKD.  CKD stage III (baseline Cr~1.5) Recent Labs  Lab 06/06/19 1644 06/07/19 0536  CREATININE  1.56* 1.67*  - Discontinued Lasix patient slightly on the dry side.   Diabetes type 2 uncontrolled with complication - 6/2 hemoglobin A1c= 7.3 -  Lantus 5 units daily - Sensitive SSI   Tobacco abuse -Counseled at length on sequela of continuing to smoke tobacco to include DEATH -Nicotine patch  EtOH abuse -Counseled at length on sequela of continuing to drink to include DEATH -CIWA protocol   Substance abuse - On admission rapid urine drug screen positive for cocaine -Counseled at length on sequela of continuing to use cocaine to include DEATH    DVT prophylaxis: Eliquis Code Status: Full Family Communication: None Disposition Plan: Discharge next 24 to 48 hours   Consultants:  None    Procedures/Significant Events:  6/5 echocardiogram: LVEF= 30-35%. The cavity size was severely dilated.  -Left ventricular diastolic Doppler parameters are consistent with restrictive filling.  -Right Ventricle: moderately reduced systolic function.  Left Atrium:-severely dilated.   Right Atrium: severely dilated. Mitral Valve:  regurgitation is moderate to severe     I have personally reviewed and interpreted all radiology studies and my findings are as above.  VENTILATOR SETTINGS:    Cultures   Antimicrobials:    Devices    LINES / TUBES:      Continuous Infusions:   Objective: Vitals:   06/06/19 2338 06/07/19 0024 06/07/19 0618 06/07/19 0618  BP: 118/86 (!) 130/91 119/83 119/83  Pulse:  90 74 86  Resp: 17 20 18 18   Temp:  (!) 97.5 F (36.4 C) 97.6 F (36.4 C) 97.6 F (36.4 C)  TempSrc:      SpO2: 96% 100% 94% 93%  Weight:  79.8 kg    Height:  5\' 11"  (1.803 m)      Intake/Output Summary (Last 24 hours) at 06/07/2019 7342 Last data filed at 06/07/2019 0455 Gross per 24 hour  Intake 60 ml  Output 350 ml  Net -290 ml   Filed Weights   06/07/19 0024  Weight: 79.8 kg    Examination:  General: A/O x4, no acute respiratory distress Eyes:  negative scleral hemorrhage, negative anisocoria, negative icterus ENT: Negative Runny nose, negative gingival bleeding, Neck:  Negative scars, masses, torticollis, lymphadenopathy, JVD Lungs: Clear to auscultation bilaterally without wheezes or crackles Cardiovascular: Regular rate and rhythm without grade 3/6 holosystolic murmur gallop or rub normal S1 and S2 Abdomen: negative abdominal pain, nondistended, positive soft, bowel sounds, no rebound, no ascites, no appreciable mass Extremities: No significant cyanosis, clubbing, or edema bilateral lower extremities Skin: Negative rashes, lesions, ulcers Psychiatric:  Negative depression, negative anxiety, negative fatigue, negative mania  Central nervous system:  Cranial nerves II through XII intact, tongue/uvula midline, all extremities muscle strength 5/5, sensation intact throughout, negative dysarthria, negative expressive aphasia, negative receptive aphasia.  .     Data Reviewed: Care during the described time interval was provided by me .  I have reviewed this patient's available data, including medical history, events of note, physical examination, and all test results as part of my evaluation.   CBC: Recent Labs  Lab 06/06/19 1644 06/06/19 1823 06/07/19 0536  WBC 5.4  --  4.6  HGB  11.2* 11.9* 10.6*  HCT 33.7* 35.0* 30.9*  MCV 92.1  --  89.8  PLT 181  --  846   Basic Metabolic Panel: Recent Labs  Lab 06/06/19 1644 06/06/19 1823 06/07/19 0536  NA 127* 130* 127*  K 4.5 4.3 3.9  CL 97*  --  98  CO2 20*  --  17*  GLUCOSE 147*  --  169*  BUN 18  --  19  CREATININE 1.56*  --  1.67*  CALCIUM 9.0  --  8.8*   GFR: Estimated Creatinine Clearance: 50.7 mL/min (A) (by C-G formula based on SCr of 1.67 mg/dL (H)). Liver Function Tests: Recent Labs  Lab 06/06/19 1734  AST 21  ALT 20  ALKPHOS 77  BILITOT 1.0  PROT 8.0  ALBUMIN 3.3*   No results for input(s): LIPASE, AMYLASE in the last 168 hours. Recent Labs  Lab  06/06/19 1734  AMMONIA 47*   Coagulation Profile: Recent Labs  Lab 06/06/19 1753 06/07/19 0536  INR 1.6* 1.5*   Cardiac Enzymes: Recent Labs  Lab 06/06/19 2112 06/06/19 2310 06/07/19 0536  TROPONINI <0.03 <0.03 <0.03   BNP (last 3 results) No results for input(s): PROBNP in the last 8760 hours. HbA1C: No results for input(s): HGBA1C in the last 72 hours. CBG: Recent Labs  Lab 06/06/19 2326 06/07/19 0619  GLUCAP 127* 140*   Lipid Profile: No results for input(s): CHOL, HDL, LDLCALC, TRIG, CHOLHDL, LDLDIRECT in the last 72 hours. Thyroid Function Tests: No results for input(s): TSH, T4TOTAL, FREET4, T3FREE, THYROIDAB in the last 72 hours. Anemia Panel: Recent Labs    06/07/19 0536  VITAMINB12 344  FOLATE 15.0  FERRITIN 177  TIBC 326  IRON 58  RETICCTPCT 2.9   Urine analysis:    Component Value Date/Time   COLORURINE YELLOW 06/06/2019 2327   APPEARANCEUR CLEAR 06/06/2019 2327   LABSPEC 1.020 06/06/2019 2327   PHURINE 5.5 06/06/2019 2327   GLUCOSEU 100 (A) 06/06/2019 2327   HGBUR NEGATIVE 06/06/2019 2327   BILIRUBINUR NEGATIVE 06/06/2019 2327   BILIRUBINUR neg 05/08/2017 1503   KETONESUR NEGATIVE 06/06/2019 2327   PROTEINUR 100 (A) 06/06/2019 2327   UROBILINOGEN 0.2 05/08/2017 1503   UROBILINOGEN 1.0 04/26/2012 1328   NITRITE NEGATIVE 06/06/2019 2327   LEUKOCYTESUR TRACE (A) 06/06/2019 2327   Sepsis Labs: @LABRCNTIP (procalcitonin:4,lacticidven:4)  ) Recent Results (from the past 240 hour(s))  SARS Coronavirus 2     Status: None   Collection Time: 06/06/19  7:55 PM  Result Value Ref Range Status   SARS Coronavirus 2 NOT DETECTED NOT DETECTED Final    Comment: (NOTE) SARS-CoV-2 target nucleic acids are NOT DETECTED. The SARS-CoV-2 RNA is generally detectable in upper and lower respiratory specimens during the acute phase of infection.  Negative  results do not preclude SARS-CoV-2 infection, do not rule out co-infections with other pathogens, and  should not be used as the sole basis for treatment or other patient management decisions.  Negative results must be combined with clinical observations, patient history, and epidemiological information. The expected result is Not Detected. Fact Sheet for Patients: http://www.biofiredefense.com/wp-content/uploads/2020/03/BIOFIRE-COVID -19-patients.pdf Fact Sheet for Healthcare Providers: http://www.biofiredefense.com/wp-content/uploads/2020/03/BIOFIRE-COVID -19-hcp.pdf This test is not yet approved or cleared by the [place] and  has been authorized for detection and/or diagnosis of SARS-CoV-2 by FDA under an Emergency Use Authorization (EUA).  This EUA will remain in effec t (meaning this test can be used) for the duration of  the COVID-19 declaration under Section 564(b)(1) of the Act, 21 U.S.C.  section 360bbb-3(b)(1), unless the authorization is terminated or revoked sooner. Performed at [facility], [place] [address] [place], [place] [address]          Radiology Studies: Dg Chest 2 View  Result Date: 06/06/2019 CLINICAL DATA:  Chest pain EXAM: CHEST - 2 VIEW COMPARISON:  CT dated 05/27/2019.  Chest x-ray dated 05/23/2019. FINDINGS: Again noted is cardiomegaly. There are small bilateral pleural effusions. There is evidence of developing pulmonary edema. There are few scattered airspace opacities bilaterally which are favored to be secondary to developing pulmonary edema. There is no pneumothorax. There is no acute osseous abnormality. IMPRESSION: Overall findings consistent with congestive heart failure with developing pulmonary edema including small bilateral pleural effusions. There are few scattered bilateral airspace opacities which are favored to represent atelectasis or sequela of pulmonary edema. An atypical infectious process can have a similar appearance in the appropriate clinical setting. Electronically Signed   By: Constance Holster M.D.   On: 06/06/2019  17:08   Ct Head Wo Contrast  Result Date: 06/06/2019 CLINICAL DATA:  Altered level of consciousness (LOC), unexplained; C-spine trauma, high clinical risk (NEXUS/CCR) EXAM: CT HEAD WITHOUT CONTRAST CT CERVICAL SPINE WITHOUT CONTRAST TECHNIQUE: Multidetector CT imaging of the head and cervical spine was performed following the standard protocol without intravenous contrast. Multiplanar CT image reconstructions of the cervical spine were also generated. COMPARISON:  Head and cervical spine CT 08/16/2018 FINDINGS: Mild motion artifact in the head and cervical spine. CT HEAD FINDINGS Brain: Similar degree of atrophy and chronic small vessel ischemia from prior. No intracranial hemorrhage, mass effect, or midline shift. No hydrocephalus. The basilar cisterns are patent. No evidence of territorial infarct or acute ischemia. No extra-axial or intracranial fluid collection. Vascular: Atherosclerosis of skullbase vasculature without hyperdense vessel or abnormal calcification. Skull: No fracture or focal lesion. Sinuses/Orbits: Paranasal sinuses and mastoid air cells are clear. The visualized orbits are unremarkable. Other: None. CT CERVICAL SPINE FINDINGS Alignment: No traumatic subluxation. Reversal of normal cervical lordosis. Skull base and vertebrae: No acute fracture, detailed evaluation slightly limited by motion. Vertebral body heights are maintained. The dens and skull base are intact, skull base better assessed on concurrent head CT. Soft tissues and spinal canal: No prevertebral fluid or swelling. No visible canal hematoma. Disc levels: Multilevel disc space narrowing and endplate spurring most prominent at C5-C6. scattered facet arthropathy. Upper chest: Bilateral and pleural effusions and pulmonary edema, concordant with chest radiograph earlier this day. Other: Carotid atherosclerosis. IMPRESSION: 1. No acute intracranial abnormality. No skull fracture. Stable atrophy and chronic small vessel ischemia. 2.  Multilevel degenerative change in the cervical spine without acute fracture or subluxation, allowing for mild motion artifact. 3. Carotid and skullbase atherosclerosis. Electronically Signed   By: Keith Rake M.D.   On: 06/06/2019 19:13   Ct Cervical Spine Wo Contrast  Result Date: 06/06/2019 CLINICAL DATA:  Altered level of consciousness (LOC), unexplained; C-spine trauma, high clinical risk (NEXUS/CCR) EXAM: CT HEAD WITHOUT CONTRAST CT CERVICAL SPINE WITHOUT CONTRAST TECHNIQUE: Multidetector CT imaging of the head and cervical spine was performed following the standard protocol without intravenous contrast. Multiplanar CT image reconstructions of the cervical spine were also generated. COMPARISON:  Head and cervical spine CT 08/16/2018 FINDINGS: Mild motion artifact in the head and cervical spine. CT HEAD FINDINGS Brain: Similar degree of atrophy and chronic small vessel ischemia from prior. No intracranial hemorrhage, mass effect, or midline shift. No hydrocephalus. The basilar cisterns are patent. No evidence of territorial infarct or  acute ischemia. No extra-axial or intracranial fluid collection. Vascular: Atherosclerosis of skullbase vasculature without hyperdense vessel or abnormal calcification. Skull: No fracture or focal lesion. Sinuses/Orbits: Paranasal sinuses and mastoid air cells are clear. The visualized orbits are unremarkable. Other: None. CT CERVICAL SPINE FINDINGS Alignment: No traumatic subluxation. Reversal of normal cervical lordosis. Skull base and vertebrae: No acute fracture, detailed evaluation slightly limited by motion. Vertebral body heights are maintained. The dens and skull base are intact, skull base better assessed on concurrent head CT. Soft tissues and spinal canal: No prevertebral fluid or swelling. No visible canal hematoma. Disc levels: Multilevel disc space narrowing and endplate spurring most prominent at C5-C6. scattered facet arthropathy. Upper chest: Bilateral and  pleural effusions and pulmonary edema, concordant with chest radiograph earlier this day. Other: Carotid atherosclerosis. IMPRESSION: 1. No acute intracranial abnormality. No skull fracture. Stable atrophy and chronic small vessel ischemia. 2. Multilevel degenerative change in the cervical spine without acute fracture or subluxation, allowing for mild motion artifact. 3. Carotid and skullbase atherosclerosis. Electronically Signed   By: Keith Rake M.D.   On: 06/06/2019 19:13        Scheduled Meds:  apixaban  5 mg Oral BID   atorvastatin  40 mg Oral q1800   carvedilol  3.125 mg Oral BID WC   folic acid  1 mg Oral Daily   furosemide  40 mg Intravenous BID   gabapentin  600 mg Oral BID   insulin aspart  0-9 Units Subcutaneous TID WC   insulin glargine  5 Units Subcutaneous Daily   loratadine  10 mg Oral Daily   multivitamin with minerals  1 tablet Oral Daily   nicotine  14 mg Transdermal Daily   pantoprazole  40 mg Oral Daily   sodium chloride flush  3 mL Intravenous Once   thiamine  100 mg Oral Daily   Or   thiamine  100 mg Intravenous Daily   Continuous Infusions:   LOS: 1 day   The patient is critically ill with multiple organ systems failure and requires high complexity decision making for assessment and support, frequent evaluation and titration of therapies, application of advanced monitoring technologies and extensive interpretation of multiple databases. Critical Care Time devoted to patient care services described in this note  Time spent: 40 minutes     Orvil Faraone, Geraldo Docker, MD [facility] Pager [phone]  If 7PM-7AM, please contact night-coverage [email or web address] Password [facility] 06/07/2019, 9:25 AM

## 2019-06-07 NOTE — Progress Notes (Signed)
CRITICAL VALUE ALERT  Critical Value:  Troponin 0.03  Date & Time Notied:  06/07/2019  1225  Provider Notified: Sherral Hammers MD

## 2019-06-07 NOTE — Progress Notes (Signed)
Third dose of NTG PRN medication given. current pain level 3 out of 10. Will continue to monitor.

## 2019-06-07 NOTE — Progress Notes (Signed)
Pt had  6  bts run of Vtach.   No complains. VS as follows. BP 119/83 (BP Location: Right Arm)   Pulse 86   Temp 97.6 F (36.4 C)   Resp 18   Ht 5\' 11"  (1.803 m)   Wt 79.8 kg   SpO2 93%   BMI 24.53 kg/m    Labs this AM. Will monitor

## 2019-06-07 NOTE — Discharge Instructions (Addendum)
Chest Wall Pain Chest wall pain is pain in or around the bones and muscles of your chest. Chest wall pain may be caused by:  An injury.  Coughing a lot.  Using your chest and arm muscles too much. Sometimes, the cause may not be known. This pain may take a few weeks or longer to get better. Follow these instructions at home: Managing pain, stiffness, and swelling If told, put ice on the painful area:  Put ice in a plastic bag.  Place a towel between your skin and the bag.  Leave the ice on for 20 minutes, 2-3 times a day.  Activity  Rest as told by your doctor.  Avoid doing things that cause pain. This includes lifting heavy items.  Ask your doctor what activities are safe for you. General instructions   Take over-the-counter and prescription medicines only as told by your doctor.  Do not use any products that contain nicotine or tobacco, such as cigarettes, e-cigarettes, and chewing tobacco. If you need help quitting, ask your doctor.  Keep all follow-up visits as told by your doctor. This is important. Contact a doctor if:  You have a fever.  Your chest pain gets worse.  You have new symptoms. Get help right away if:  You feel sick to your stomach (nauseous) or you throw up (vomit).  You feel sweaty or light-headed.  You have a cough with mucus from your lungs (sputum) or you cough up blood.  You are short of breath. These symptoms may be an emergency. Do not wait to see if the symptoms will go away. Get medical help right away. Call your local emergency services (911 in the [place] Do not drive yourself to the hospital. Summary  Chest wall pain is pain in or around the bones and muscles of your chest.  It may be treated with ice, rest, and medicines. Your condition may also get better if you avoid doing things that cause pain.  Contact a doctor if you have a fever, chest pain that gets worse, or new symptoms.  Get help right away if you feel  light-headed or you get short of breath. These symptoms may be an emergency. This information is not intended to replace advice given to you by your health care provider. Make sure you discuss any questions you have with your health care provider. Document Released: 05/26/2008 Document Revised: 06/10/2018 Document Reviewed: 06/10/2018 Elsevier Interactive Patient Education  2019 [place].   Heart Failure  Heart failure means your heart has trouble pumping blood. This makes it hard for your body to work well. Heart failure is usually a long-term (chronic) condition. You must take good care of yourself and follow your treatment plan from your doctor. Follow these instructions at home: Medicines  Take over-the-counter and prescription medicines only as told by your doctor. ? Do not stop taking your medicine unless your doctor told you to do that. ? Do not skip any doses. ? Refill your prescriptions before you run out of medicine. You need your medicines every day. Eating and drinking   Eat heart-healthy foods. Talk with a diet and nutrition specialist (dietitian) to make an eating plan.  Choose foods that: ? Have no trans fat. ? Are low in saturated fat and cholesterol.  Choose healthy foods, like: ? Fresh or frozen fruits and vegetables. ? Fish. ? Low-fat (lean) meats. ? Legumes (like beans, peas, and lentils). ? Fat-free or low-fat dairy products. ? Whole-grain foods. ? High-fiber foods.  Limit salt (sodium) if told by your doctor. Ask your nutrition specialist to recommend heart-healthy seasonings.  Cook in healthy ways instead of frying. Healthy ways of cooking include: ? Roasting. ? Grilling. ? Broiling. ? Baking. ? Poaching. ? Steaming. ? Stir-frying.  Limit how much fluid you drink, if told by your doctor. Lifestyle  Do not smoke or use chewing tobacco. Do not use nicotine gum or patches before talking to your doctor.  Limit alcohol intake to no more than 1  drink a day for non-pregnant women and 2 drinks a day for men. One drink equals 12 oz of beer, 5 oz of wine, or 1 oz of hard liquor. ? Tell your doctor if you drink alcohol many times a week. ? Talk with your doctor about whether any alcohol is safe for you. ? You should stop drinking alcohol: ? If your heart has been damaged by alcohol. ? You have very bad heart failure.  Do not use illegal drugs.  Lose weight if told by your doctor.  Do moderate physical activity if told by your doctor. Ask your doctor what activities are safe for you if: ? You are of older age (elderly). ? You have very bad heart failure. Keep track of important information  Weigh yourself every day. ? Weigh yourself every morning after you pee (urinate) and before breakfast. ? Wear the same amount of clothing each time. ? Write down your daily weight. Give your record to your doctor.  Check and write down your blood pressure as told by your doctor.  Check your pulse as told by your doctor. Dealing with heat and cold  If the weather is very hot: ? Avoid activity that takes a lot of energy. ? Use air conditioning or fans, or find a cooler place. ? Avoid caffeine. ? Avoid alcohol. ? Wear clothing that is loose-fitting, lightweight, and light-colored.  If the weather is very cold: ? Avoid activity that takes a lot of energy. ? Layer your clothes. ? Wear mittens or gloves, a hat, and a scarf when you go outside. ? Avoid alcohol. General instructions  Manage other conditions that you have as told by your doctor.  Learn to manage stress. If you need help, ask your doctor.  Plan rest periods for when you get tired.  Get education and support as needed.  Get rehab (rehabilitation) to help you stay independent and to help with everyday tasks.  Stay up to date with shots (immunizations), especially pneumococcal and flu (influenza) shots.  Keep all follow-up visits as told by your doctor. This is  important. Contact a doctor if:  You gain weight quickly.  You are more short of breath than normal.  You cannot do your normal activities.  You tire easily.  You cough more than normal, especially with activity.  You have any or more puffiness (swelling) in areas such as your hands, feet, ankles, or belly (abdomen).  You cannot sleep because it is hard to breathe.  You feel like your heart is beating fast (palpitations).  You get dizzy or light-headed when you stand up. Get help right away if:  You have trouble breathing.  You or someone else notices a change in your awareness. This could be trouble staying awake or trouble concentrating.  You have chest pain or discomfort.  You pass out (faint). Summary  Heart failure means your heart has trouble pumping blood.  Make sure you refill your prescriptions before you run out of medicine. You need  your medicines every day.  Keep records of your weight and blood pressure to give to your doctor.  Contact a doctor if you gain weight quickly. This information is not intended to replace advice given to you by your health care provider. Make sure you discuss any questions you have with your health care provider. Document Released: 09/16/2008 Document Revised: 09/01/2018 Document Reviewed: 12/30/2016 Elsevier Interactive Patient Education  2019 [place].   Heart Failure Action Plan A heart failure action plan helps you understand what to do when you have symptoms of heart failure. Follow the plan that was created by you and your health care provider. Review your plan each time you visit your health care provider. Red zone These signs and symptoms mean you should get medical help right away:  You have trouble breathing when resting.  You have a dry cough that is getting worse.  You have swelling or pain in your legs or abdomen that is getting worse.  You suddenly gain more than 2-3 lb (0.9-1.4 kg) in a day, or more than  5 lb (2.3 kg) in one week. This amount may be more or less depending on your condition.  You have trouble staying awake or you feel confused.  You have chest pain.  You do not have an appetite.  You pass out. If you experience any of these symptoms:  Call your local emergency services (911 in the [place] right away or seek help at the emergency department of the nearest hospital. Yellow zone These signs and symptoms mean your condition may be getting worse and you should make some changes:  You have trouble breathing when you are active or you need to sleep with extra pillows.  You have swelling in your legs or abdomen.  You gain 2-3 lb (0.9-1.4 kg) in one day, or 5 lb (2.3 kg) in one week. This amount may be more or less depending on your condition.  You get tired easily.  You have trouble sleeping.  You have a dry cough. If you experience any of these symptoms:  Contact your health care provider within the next day.  Your health care provider may adjust your medicines. Green zone These signs mean you are doing well and can continue what you are doing:  You do not have shortness of breath.  You have very little swelling or no new swelling.  Your weight is stable (no gain or loss).  You have a normal activity level.  You do not have chest pain or any other new symptoms. Follow these instructions at home:  Take over-the-counter and prescription medicines only as told by your health care provider.  Weigh yourself daily. Your target weight is __________ lb (__________ kg). ? Call your health care provider if you gain more than __________ lb (__________ kg) in a day, or more than __________ lb (__________ kg) in one week.  Eat a heart-healthy diet. Work with a diet and nutrition specialist (dietitian) to create an eating plan that is best for you.  Keep all follow-up visits as told by your health care provider. This is important. Where to find more  information  American Heart Association: www.heart.org Summary  Follow the action plan that was created by you and your health care provider.  Get help right away if you have any symptoms in the Red zone. This information is not intended to replace advice given to you by your health care provider. Make sure you discuss any questions you have with your  health care provider. Document Released: 01/17/2017 Document Revised: 08/12/2017 Document Reviewed: 01/17/2017 Elsevier Interactive Patient Education  2019 [place].   Heart Failure Eating Plan Heart failure, also called congestive heart failure, occurs when your heart does not pump blood well enough to meet your body's needs for oxygen-rich blood. Heart failure is a long-term (chronic) condition. Living with heart failure can be challenging. However, following your health care provider's instructions about a healthy lifestyle and working with a diet and nutrition specialist (dietitian) to choose the right foods may help to improve your symptoms. What are tips for following this plan? General guidelines  Do not eat more than 2,300 mg of salt (sodium) a day. The amount of sodium that is recommended for you may be lower, depending on your condition.  Maintain a healthy body weight as directed. Ask your health care provider what a healthy weight is for you. ? Check your weight every day. ? Work with your health care provider and dietitian to make a plan that is right for you to lose weight or maintain your current weight.  Limit how much fluid you drink. Ask your health care provider or dietitian how much fluid you can have each day.  Limit or avoid alcohol as told by your health care provider or dietitian. Reading food labels  Check food labels for the amount of sodium per serving. Choose foods that have less than 140 mg (milligrams) of sodium in each serving.  Check food labels for the number of calories per serving. This is important  if you need to limit your daily calorie intake to lose weight.  Check food labels for the serving size. If you eat more than one serving, you will be eating more sodium and calories than what is listed on the label.  Look for foods that are labeled as "sodium-free," "very low sodium," or "low sodium." ? Foods labeled as "reduced sodium" or "lightly salted" may still have more sodium than what is recommended for you. Cooking  Avoid adding salt when cooking. Ask your health care provider or dietitian before using salt substitutes.  Season food with salt-free seasonings, spices, or herbs. Check the label of seasoning mixes to make sure they do not contain salt.  Cook with heart-healthy oils, such as olive, canola, soybean, or sunflower oil.  Do not fry foods. Cook foods using low-fat methods, such as baking, boiling, grilling, and broiling.  Limit unhealthy fats when cooking by: ? Removing the skin from poultry, such as chicken. ? Removing all visible fats from meats. ? Skimming the fat off from stews, soups, and gravies before serving them. Meal planning   Limit your intake of: ? Processed, canned, or pre-packaged foods. ? Foods that are high in trans fat, such as fried foods. ? Sweets, desserts, sugary drinks, and other foods with added sugar. ? Full-fat dairy products, such as whole milk.  Eat a balanced diet that includes: ? 4-5 servings of fruit each day and 4-5 servings of vegetables each day. At each meal, try to fill half of your plate with fruits and vegetables. ? Up to 6-8 servings of whole grains each day. ? Up to 2 servings of lean meat, poultry, or fish each day. One serving of meat is equal to 3 oz. This is about the same size as a deck of cards. ? 2 servings of low-fat dairy each day. ? Heart-healthy fats. Healthy fats called omega-3 fatty acids are found in foods such as flaxseed and cold-water fish like  sardines, salmon, and mackerel.  Aim to eat 25-35 g (grams) of  fiber a day. Foods that are high in fiber include apples, broccoli, carrots, beans, peas, and whole grains.  Do not add salt or condiments that contain salt (such as soy sauce) to foods before eating.  When eating at a restaurant, ask that your food be prepared with less salt or no salt, if possible.  Try to eat 2 or more vegetarian meals each week.  Eat more home-cooked food and eat less restaurant, buffet, and fast food. Recommended foods The items listed may not be a complete list. Talk with your dietitian about what dietary choices are best for you. Grains Bread with less than 80 mg of sodium per slice. Whole-wheat pasta, quinoa, and brown rice. Oats and oatmeal. Barley. [place]. Grits and cream of wheat. Whole-grain and whole-wheat cold cereal. Vegetables All fresh vegetables. Vegetables that are frozen without sauce or added salt. Low-sodium or sodium-free canned vegetables. Fruits All fresh, frozen, and canned fruits. Dried fruits, such as raisins, prunes, and cranberries. Meats and other protein foods Lean cuts of meat. Skinless chicken and [place]. Fish with high omega-3 fatty acids, such as salmon, sardines, and other cold-water fishes. Eggs. Dried beans, peas, and edamame. Unsalted nuts and nut butters. Dairy Low-fat or nonfat (skim) milk and dried milk. Rice milk, soy milk, and almond milk. Low-fat or nonfat yogurt. Small amounts of reduced-sodium block cheese. Low-sodium cottage cheese. Fats and oils Olive, canola, soybean, flaxseed, or sunflower oil. Avocado. Sweets and desserts Apple sauce. Granola bars. Sugar-free pudding and gelatin. Frozen fruit bars. Seasoning and other foods Fresh and dried herbs. Lemon or lime juice. Vinegar. Low-sodium ketchup. Salt-free marinades, salad dressings, sauces, and seasonings. Foods to avoid The items listed may not be a complete list. Talk with your dietitian about what dietary choices are best for you. Grains Bread with more than 80 mg  of sodium per slice. Hot or cold cereal with more than 140 mg sodium per serving. Salted pretzels and crackers. Pre-packaged breadcrumbs. Bagels, croissants, and biscuits. Vegetables Canned vegetables. Frozen vegetables with sauce or seasonings. Creamed vegetables. [place] fries. Onion rings. Pickled vegetables and sauerkraut. Fruits Fruits that are dried with sodium-containing preservatives. Meats and other protein foods Ribs and chicken wings. Bacon, ham, pepperoni, bologna, salami, and packaged luncheon meats. Hot dogs, bratwurst, and sausage. Canned meat. Smoked meat and fish. Salted nuts and seeds. Dairy Whole milk, half-and-half, and cream. Buttermilk. Processed cheese, cheese spreads, and cheese curds. Regular cottage cheese. Feta cheese. Shredded cheese. String cheese. Fats and oils Butter, lard, shortening, ghee, and bacon fat. Canned and packaged gravies. Seasoning and other foods Onion salt, garlic salt, table salt, and sea salt. Marinades. Regular salad dressings. Relishes, pickles, and olives. Meat flavorings and tenderizers, and bouillon cubes. Horseradish, ketchup, and mustard. Worcestershire sauce. Teriyaki sauce, soy sauce (including reduced sodium). Hot sauce and Tabasco sauce. Steak sauce, fish sauce, oyster sauce, and cocktail sauce. Taco seasonings. Barbecue sauce. T[place]ar sauce. Summary  A heart failure eating plan includes changes that limit your intake of sodium and unhealthy fat, and it may help you lose weight or maintain a healthy weight. Your health care provider may also recommend limiting how much fluid you drink.  Most people with heart failure should eat no more than 2,300 mg of salt (sodium) a day. The amount of sodium that is recommended for you may be lower, depending on your condition.  Contact your health care provider or dietitian before making any major  changes to your diet. This information is not intended to replace advice given to you by your health care  provider. Make sure you discuss any questions you have with your health care provider. Document Released: 04/24/2017 Document Revised: 04/24/2017 Document Reviewed: 04/24/2017 Elsevier Interactive Patient Education  2019 [place] With Heart Failure  Heart failure is a long-term (chronic) condition in which the heart cannot pump enough blood through the body. When this happens, parts of the body do not get the blood and oxygen they need. There is no cure for heart failure at this time, so it is important for you to take good care of yourself and follow the treatment plan set by your health care provider. If you are living with heart failure, there are ways to help you manage the disease. Follow these instructions at home: Living with heart failure requires you to make changes in your life. Your health care team will teach you about the changes you need to make in order to relieve your symptoms and lower your risk of going to the hospital. Follow the treatment plan as set by your health care provider. Medicines Medicines are important in reducing your heart's workload, slowing the progression of heart failure, and improving your symptoms.  Take over-the-counter and prescription medicines only as told by your health care provider.  Do not stop taking your medicine unless your health care provider tells you to do that.  Do not skip any dose of your medicine.  Refill prescriptions before you run out of medicine. You need your medicines every day. Eating and drinking   Eat heart-healthy foods. Talk with a dietitian to make an eating plan that is right for you. ? If directed by your health care provider: ? Limit salt (sodium). Lowering your sodium intake may reduce symptoms of heart failure. Ask a dietitian to recommend heart-healthy seasonings. ? Limit your fluid intake. Fluid restriction may reduce symptoms of heart failure. ? Use low-fat cooking methods instead of frying.  Low-fat methods include roasting, grilling, broiling, baking, poaching, steaming, and stir-frying. ? Choose foods that contain no trans fat and are low in saturated fat and cholesterol. Healthy choices include fresh or frozen fruits and vegetables, fish, lean meats, legumes, fat-free or low-fat dairy products, and whole-grain or high-fiber foods.  Limit alcohol intake to no more than 1 drink a day for nonpregnant women and 2 drinks a day for men. One drink equals 12 oz of beer, 5 oz of wine, or 1 oz of hard liquor. ? Drinking more than that is harmful to your heart. Tell your health care provider if you drink alcohol several times a week. ? Talk with your health care provider about whether any level of alcohol use is safe for you. Activity   Ask your health care provider about attending cardiac rehabilitation. These programs include aerobic physical activity, which provides many benefits for your heart.  If no cardiac rehabilitation program is available, ask your health care provider what aerobic exercises are safe for you to do. Lifestyle Make the lifestyle changes recommended by your health care provider. In general:  Lose weight if your health care provider tells you to do that. Weight loss may reduce symptoms of heart failure.  Do not use any products that contain nicotine or tobacco, such as cigarettes or e-cigarettes. If you need help quitting, ask your health care provider.  Do not use street (illegal) drugs.  Return to your normal activities as told by your health  care provider. Ask your health care provider what activities are safe for you. General instructions   Make sure you weigh yourself every day to track your weight. Rapid weight gain may indicate an increase in fluid in your body and may increase the workload of your heart. ? Weigh yourself every morning. Do this after you urinate but before you eat breakfast. ? Wear the same type of clothing, without shoes, each time you  weigh yourself. ? Weigh yourself on the same scale and in the same spot each time.  Living with chronic heart failure often leads to emotions such as fear, stress, anxiety, and depression. If you feel any of these emotions and need help coping, contact your health care provider. Other ways to get help include: ? Talking to friends and family members about your condition. They can give you support and guidance. Explain your symptoms to them and, if comfortable, invite them to attend appointments or rehabilitation with you. ? Joining a support group for people with chronic heart failure. Talking with other people who have the same symptoms may give you new ways of coping with your disease and your emotions.  Stay up to date with your shots (vaccines). Staying current on pneumococcal and influenza vaccines is especially important in preventing germs from attacking your airways (respiratory infections).  Keep all follow-up visits as told by your health care provider. This is important. How to recognize changes in your condition You and your family members need to know what changes to watch for in your condition. Watch for the following changes and report them to your health care provider:  Sudden weight gain. Ask your health care provider what amount of weight gain to report.  Shortness of breath: ? Feeling short of breath while at rest, with no exercise or activity that required great effort. ? Feeling breathless with activity.  Swelling of your lower legs or ankles.  Difficulty sleeping: ? You wake up feeling short of breath. ? You have to use more pillows to raise your head in order to sleep.  Frequent, dry, hacking cough.  Loss of appetite.  Feeling more tired all the time.  Depression or feelings of sadness or hopelessness.  Bloating in the stomach. Where to find more information  Local support groups. Ask your health care provider about groups near you.  The American Heart  Association: www.heart.org Contact a health care provider if:  You have a rapid weight gain.  You have increasing shortness of breath that is unusual for you.  You are unable to participate in your usual physical activities.  You tire easily.  You cough more than normal, especially with physical activity.  You have any swelling or more swelling in areas such as your hands, feet, ankles, or abdomen.  You feel like your heart is beating quickly (palpitations).  You become dizzy or light-headed when you stand up. Get help right away if:  You have difficulty breathing.  You notice or your family notices a change in your awareness, such as having trouble staying awake or having difficulty with concentration.  You have pain or discomfort in your chest.  You have an episode of fainting (syncope). Summary  There is no cure for heart failure, so it is important for you to take good care of yourself and follow the treatment plan set by your health care provider.  Medicines are important in reducing your heart's workload, slowing the progression of heart failure, and improving your symptoms.  Living with chronic  heart failure often leads to emotions such as fear, stress, anxiety, and depression. If you are feeling any of these emotions and need help coping, contact your health care provider. This information is not intended to replace advice given to you by your health care provider. Make sure you discuss any questions you have with your health care provider. Document Released: 04/22/2017 Document Revised: 04/22/2017 Document Reviewed: 04/22/2017 Elsevier Interactive Patient Education  2019 [employer]   Nonspecific Chest Pain Chest pain can be caused by many different conditions. Some causes of chest pain can be life-threatening. These will require treatment right away. Serious causes of chest pain include:  Heart attack.  A tear in the body's main blood vessel.  Redness and  swelling (inflammation) around your heart.  Blood clot in your lungs. Other causes of chest pain may not be so serious. These include:  Heartburn.  Anxiety or stress.  Damage to bones or muscles in your chest.  Lung infections. Chest pain can feel like:  Pain or discomfort in your chest.  Crushing, pressure, aching, or squeezing pain.  Burning or tingling.  Dull or sharp pain that is worse when you move, cough, or take a deep breath.  Pain or discomfort that is also felt in your back, neck, jaw, shoulder, or arm, or pain that spreads to any of these areas. It is hard to know whether your pain is caused by something that is serious or something that is not so serious. So it is important to see your doctor right away if you have chest pain. Follow these instructions at home: Medicines  Take over-the-counter and prescription medicines only as told by your doctor.  If you were prescribed an antibiotic medicine, take it as told by your doctor. Do not stop taking the antibiotic even if you start to feel better. Lifestyle   Rest as told by your doctor.  Do not use any products that contain nicotine or tobacco, such as cigarettes, e-cigarettes, and chewing tobacco. If you need help quitting, ask your doctor.  Do not drink alcohol.  Make lifestyle changes as told by your doctor. These may include: ? Getting regular exercise. Ask your doctor what activities are safe for you. ? Eating a heart-healthy diet. A diet and nutrition specialist (dietitian) can help you to learn healthy eating options. ? Staying at a healthy weight. ? Treating diabetes or high blood pressure, if needed. ? Lowering your stress. Activities such as yoga and relaxation techniques can help. General instructions  Pay attention to any changes in your symptoms. Tell your doctor about them or any new symptoms.  Avoid any activities that cause chest pain.  Keep all follow-up visits as told by your doctor. This  is important. You may need more testing if your chest pain does not go away. Contact a doctor if:  Your chest pain does not go away.  You feel depressed.  You have a fever. Get help right away if:  Your chest pain is worse.  You have a cough that gets worse, or you cough up blood.  You have very bad (severe) pain in your belly (abdomen).  You pass out (faint).  You have either of these for no clear reason: ? Sudden chest discomfort. ? Sudden discomfort in your arms, back, neck, or jaw.  You have shortness of breath at any time.  You suddenly start to sweat, or your skin gets clammy.  You feel sick to your stomach (nauseous).  You throw up (vomit).  You suddenly feel lightheaded or dizzy.  You feel very weak or tired.  Your heart starts to beat fast, or it feels like it is skipping beats. These symptoms may be an emergency. Do not wait to see if the symptoms will go away. Get medical help right away. Call your local emergency services (911 in the [place] Do not drive yourself to the hospital. Summary  Chest pain can be caused by many different conditions. The cause may be serious and need treatment right away. If you have chest pain, see your doctor right away.  Follow your doctor's instructions for taking medicines and making lifestyle changes.  Keep all follow-up visits as told by your doctor. This includes visits for any further testing if your chest pain does not go away.  Be sure to know the signs that show that your condition has become worse. Get help right away if you have these symptoms. This information is not intended to replace advice given to you by your health care provider. Make sure you discuss any questions you have with your health care provider. Document Released: 05/26/2008 Document Revised: 06/10/2018 Document Reviewed: 06/10/2018 Elsevier Interactive Patient Education  2019 [employer].   Substance Use Disorder Substance use disorder occurs  when a person's repeated use of drugs or alcohol interferes with his or her ability to be productive. This disorder can cause problems with mental and physical health. It can affect your ability to have healthy relationships, and it can keep you from being able to meet your responsibilities at work, home, or school. It can also lead to addiction, which is a condition in which the person cannot stop using the substance consistently for a period of time. The most commonly abused substances include:  Alcohol.  Tobacco.  Marijuana.  Stimulants, such as cocaine and methamphetamine.  Hallucinogens, such as LSD and PCP.  Opioids, such as some prescription pain medicines and heroin. What are the causes? This condition may develop due to many complex social, psychological, or physical reasons, such as:  Stress.  Abuse.  Peer pressure.  Anxiety or depression. What increases the risk? This condition is more likely to develop in people who:  Use substances to cope with stress.  Have been abused.  Have a mental health disorder, such as depression.  Have a family history of substance use disorder. What are the signs or symptoms? Symptoms of this condition include:  Using the substance for longer periods of time or at a higher dosage than what is normal or intended.  Having a lasting desire to use the substance.  Being unable to slow down or stop your use of the substance.  Spending an abnormal amount of time getting the substance, using the substance, or recovering from using the substance.  Craving the substance.  Using the substance in a way that interferes with work, school, social activities, and personal relationships.  Using the substance even after having negative consequences, such as: ? Health problems. ? Legal or financial troubles. ? Job loss. ? Broken relationships.  Needing more and more of the substance to get the same effect (developing  tolerance).  Experiencing unpleasant symptoms if you do not use the substance (withdrawal).  Using the substance to avoid withdrawal. How is this diagnosed? This condition may be diagnosed based on:  A physical exam.  Your history of substance use.  Your symptoms. This includes: ? How substance use affects your life. ? Changes in personality, behaviors, and mood. ? Having at least two  symptoms of substance use disorder within a 71-month period. ? Health issues related to substance use, such as liver damage, shortness of breath, fatigue, cough, or heart problems.  Blood or urine tests to screen for alcohol and drugs. How is this treated? This condition may be treated by:  Stopping substance use safely. This may require taking medicines and being closely observed for several days.  Taking part in group and individual counseling from mental health providers who help people with substance use disorder.  Staying at a live-in (residential) treatment center for several days or weeks.  Attending daily counseling sessions at a treatment center.  Taking medicine as told by your health care provider: ? To ease symptoms and prevent complications during withdrawal. ? To treat other mental health issues, such as depression or anxiety. ? To block cravings by causing the same effects as the substance. ? To block the effects of the substance or replace good sensations with unpleasant ones.  Going to a support group to share your experience with others who are going through the same thing. Recovery can be a long process. Many people who undergo treatment start using the substance again after stopping (relapse). If you relapse, that does not mean that treatment will not work. Follow these instructions at home:   Take over-the-counter and prescription medicines only as told by your health care provider.  Do not use any drugs or alcohol.  Attend support groups as needed. These groups,  including 12-step programs like Alcoholics Anonymous and Narcotics Anonymous, are an important part of long-term recovery for many people.  Keep all follow-up visits as told by your health care providers. This is important. This includes continuing to work with therapists and support groups. Contact a health care provider if:  You cannot take your medicines as told.  Your symptoms get worse.  You have trouble resisting the urge to use drugs or alcohol. Get help right away if you:  Relapse.  Think that you may have taken too much of a drug. The hotline of the [facility] is (765)576-9818.  Have signs of an overdose. Symptoms include: ? Chest pain. ? Confusion. ? Sleepiness or difficulty staying awake. ? Slowed breathing. ? Nausea or vomiting. ? A seizure.  Have serious thoughts about hurting yourself or someone else. Drug overdose is an emergency. Do not wait to see if the symptoms will go away. Get medical help right away. Call your local emergency services (911 in the [place] Do not drive yourself to the hospital. If you ever feel like you may hurt yourself or others, or have thoughts about taking your own life, get help right away. You can go to your nearest emergency department or call:  Your local emergency services (911 in the [place]  A suicide crisis helpline, such as the [place] at (609)476-3667. This is open 24 hours a day. Summary  Substance use disorder occurs when a person's repeated use of drugs or alcohol interferes with his or her ability to be productive. It can affect your ability to have healthy relationships, keep you from being able to meet your responsibilities at work, home, or school, and lead to addiction.  Taking part in group and individual counseling from mental health providers is one possible treatment for people with substance use disorder.  Recovery can be a long process. Many people who undergo  treatment start using the substance again after stopping (relapse). A relapse does not mean that treatment will not  work.  Attend support groups as needed, such as Alcoholics Anonymous and Narcotics Anonymous. These groups are an important part of long-term recovery for many people. This information is not intended to replace advice given to you by your health care provider. Make sure you discuss any questions you have with your health care provider. Document Released: 07/30/2005 Document Revised: 01/19/2018 Document Reviewed: 01/19/2018 Elsevier Interactive Patient Education  2019 [place] on my medicine - ELIQUIS (apixaban)  Why was Eliquis prescribed for you? Eliquis was prescribed for you to reduce the risk of a blood clot forming that can cause a stroke if you have a medical condition called atrial fibrillation (a type of irregular heartbeat).  What do You need to know about Eliquis ? Take your Eliquis TWICE DAILY - one tablet in the morning and one tablet in the evening with or without food. If you have difficulty swallowing the tablet whole please discuss with your pharmacist how to take the medication safely.  Take Eliquis exactly as prescribed by your doctor and DO NOT stop taking Eliquis without talking to the doctor who prescribed the medication.  Stopping may increase your risk of developing a stroke.  Refill your prescription before you run out.  After discharge, you should have regular check-up appointments with your healthcare provider that is prescribing your Eliquis.  In the future your dose may need to be changed if your kidney function or weight changes by a significant amount or as you get older.  What do you do if you miss a dose? If you miss a dose, take it as soon as you remember on the same day and resume taking twice daily.  Do not take more than one dose of ELIQUIS at the same time to make up a missed dose.  Important Safety Information A  possible side effect of Eliquis is bleeding. You should call your healthcare provider right away if you experience any of the following: ? Bleeding from an injury or your nose that does not stop. ? Unusual colored urine (red or dark brown) or unusual colored stools (red or black). ? Unusual bruising for unknown reasons. ? A serious fall or if you hit your head (even if there is no bleeding).  Some medicines may interact with Eliquis and might increase your risk of bleeding or clotting while on Eliquis. To help avoid this, consult your healthcare provider or pharmacist prior to using any new prescription or non-prescription medications, including herbals, vitamins, non-steroidal anti-inflammatory drugs (NSAIDs) and supplements.  This website has more information on Eliquis (apixaban): http://www.eliquis.com/eliquis/home

## 2019-06-08 ENCOUNTER — Encounter (HOSPITAL_COMMUNITY): Payer: Medicaid Other

## 2019-06-08 LAB — BASIC METABOLIC PANEL
Anion gap: 9 (ref 5–15)
BUN: 22 mg/dL — ABNORMAL HIGH (ref 6–20)
CO2: 23 mmol/L (ref 22–32)
Calcium: 8.8 mg/dL — ABNORMAL LOW (ref 8.9–10.3)
Chloride: 98 mmol/L (ref 98–111)
Creatinine, Ser: 1.87 mg/dL — ABNORMAL HIGH (ref 0.61–1.24)
GFR calc Af Amer: 45 mL/min — ABNORMAL LOW (ref >60)
GFR calc non Af Amer: 38 mL/min — ABNORMAL LOW (ref >60)
Glucose, Bld: 134 mg/dL — ABNORMAL HIGH (ref 70–99)
Potassium: 3.7 mmol/L (ref 3.5–5.1)
Sodium: 130 mmol/L — ABNORMAL LOW (ref 135–145)

## 2019-06-08 LAB — CBC
HCT: 32.6 % — ABNORMAL LOW (ref 39.0–52.0)
Hemoglobin: 10.8 g/dL — ABNORMAL LOW (ref 13.0–17.0)
MCH: 30.5 pg (ref 26.0–34.0)
MCHC: 33.1 g/dL (ref 30.0–36.0)
MCV: 92.1 fL (ref 80.0–100.0)
Platelets: 175 10*3/uL (ref 150–400)
RBC: 3.54 MIL/uL — ABNORMAL LOW (ref 4.22–5.81)
RDW: 16.9 % — ABNORMAL HIGH (ref 11.5–15.5)
WBC: 4.6 10*3/uL (ref 4.0–10.5)
nRBC: 0 % (ref 0.0–0.2)

## 2019-06-08 LAB — URINE CULTURE: Culture: NO GROWTH

## 2019-06-08 LAB — LIPID PANEL
Cholesterol: 100 mg/dL (ref 0–200)
HDL: 36 mg/dL — ABNORMAL LOW (ref >40)
LDL Cholesterol: 52 mg/dL (ref 0–99)
Total CHOL/HDL Ratio: 2.8 RATIO
Triglycerides: 58 mg/dL (ref <150)
VLDL: 12 mg/dL (ref 0–40)

## 2019-06-08 LAB — GLUCOSE, CAPILLARY
Glucose-Capillary: 144 mg/dL — ABNORMAL HIGH (ref 70–99)
Glucose-Capillary: 170 mg/dL — ABNORMAL HIGH (ref 70–99)
Glucose-Capillary: 95 mg/dL (ref 70–99)
Glucose-Capillary: 133 mg/dL — ABNORMAL HIGH (ref 70–99)

## 2019-06-08 LAB — TROPONIN I: Troponin I: 0.03 ng/mL (ref <0.03)

## 2019-06-08 LAB — MAGNESIUM: Magnesium: 1.9 mg/dL (ref 1.7–2.4)

## 2019-06-08 MED ORDER — HYDROMORPHONE HCL 1 MG/ML IJ SOLN
0.5000 mg | Freq: Once | INTRAMUSCULAR | Status: AC
  Administered 2019-06-08: 0.5 mg via INTRAVENOUS
  Filled 2019-06-08: qty 0.5

## 2019-06-08 NOTE — Progress Notes (Addendum)
Patient had questions for doctor about his fluid restriction. When she called on the phone he stated he was having 10/10 chest pain. RN made aware at the time. EKG to be obtained. Patient with significant pain when palpating his chest where the pain is. Will continue to monitor.
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
## 2019-06-08 NOTE — Progress Notes (Signed)
PROGRESS NOTE  Brad [name] ZOX:[id number] DOB: 02/06/1961 DOA: 06/06/2019 PCP: Charlott Rakes, MD  HPI/Recap of past 24 hours: Brief Narrative:  [name] PMHx HTN, paroxysmal A. fib/flutter,  chronic combined systolic and diastolic congestive heart failure/nonischemic cardiomyopathy, CKD stage III, type 2 diabetes, hepatitis C and cirrhosis, HLD, IV drug abuse ,prostate cancer, obese    and conditions listed below presenting to the hospital for evaluation of generalized weakness and chest pain.Patient reports having generalized weakness, dry cough, shortness of breath, orthopnea, and bilateral lower extremity edema for the past few days. States he takes a fluid pill at home but is not sure which one.States he had previously stopped using IV drugs but relapsed 3 weeks ago. He last used cocaine yesterday. This morning around 7 AM he woke up with substernal pressure-like chest pain which radiated to the left side of his chest and left arm. The chest pain lasted the entire day and finally resolved after he reached the ED. Chest pain was associated with shortness of breath and diaphoresis. Not associated with nausea. Denies any fevers or chills. States he drinks 2 beers every day, last drink this afternoon. He smokes 1/2 pack of cigarettes daily.  ED Course:Vital signs stable on arrival. No leukocytosis. Hemoglobin 11.9, was 11.8tendays ago. Corrected sodium 128, chronically low. Creatinine 1.5, similar to recent baseline. Ammonia level mildly elevated at 47. LFTs normal. Blood ethanol level negative. BNP 1314. UA, urine culture, UDS, and COVID-19 rapid test pending. Chest x-ray with findings consistent with CHF with developing pulmonary edema including small bilateral pleural effusions. Also showing a few scattered bilateral airspace opacities which are favored to represent atelectasis versus sequela of pulmonary edema. Head CT negative for acute finding. CT C-spine  negative for acute finding. EKG with new T wave inversions in lead III and aVF.Troponin negative. Received IV Lasix 40 mg in the ED.  06/08/19: Patient seen and examined at his bedside.  He is upset because he did not receive syrup this morning.  States his chest pain is still present sharp and worse with cough, intermittently radiating to his left arm. Trop negative x 4. VS reviewed and are stable. 12 lead EKD done on 06/07/19 showed SR 1st degree av block. No specific ST changes.   Assessment/Plan: Principal Problem:   Acute exacerbation of CHF (congestive heart failure) (HCC) Active Problems:   Anemia   Hyponatremia   Chest pain   Acute hepatic encephalopathy  Chest pain, ACS ruled out - Not likely ACS, most likely related to CHF + cocaine use -Trop negative x 4 -Cocaine cessation counseling  Last Labs         Recent Labs  Lab 06/06/19 2112 06/06/19 2310 06/07/19 0536 06/07/19 1135  TROPONINI <0.03 <0.03 <0.03 0.03*     -Tele monitoring  Acute on chronic systolic and diastolic CHF - Exacerbated by multiple causes cocaine use, noncompliance with medication - C/w strict I&O and daily weight - Coreg 3.125 mg twice daily  Essential HTN -See CHF - Controlled  Paroxysmal atrial fibrillation - Eliquis 5 mg twice daily - Currently NSR  HLD - Lipitor 40 mg daily -Lipid panel pending  Hepatic encephalopathy -Resolved  Normocytic anemia Hemoglobin 11.9, was 11.8tendays ago.  -Anemia panel pending -No signs or symptoms of acute bleed continue to monitor CBC  Chronic hyponatremia in the setting of liver cirrhosis -Na+ improving 130 from 128   -C/w fluid restriction  CKD stage III (baseline Cr~1.5) Last Labs       Recent  Labs  Lab 06/06/19 1644 06/07/19 0536  CREATININE 1.56* 1.67*    - Discontinued Lasix patient slightly on the dry side.  Diabetes type 2 uncontrolled with complication - 6/2 hemoglobin A1c= 7.3 - Lantus 5 units daily -  Sensitive SSI  Tobacco abuse -Counseled at length on sequela of continuing to smoke tobacco to include DEATH -Nicotine patch  EtOH abuse -Counseled at length on sequela of continuing to drink to include DEATH -CIWA protocol  Substance abuse - On admission rapid urine drug screen positive for cocaine -Counseled at length on sequela of continuing to use cocaine to include DEATH    DVT prophylaxis: Eliquis Code Status: Full Family Communication: None Disposition Plan: Discharge next 24 to 48 hours   Consultants:  None    Procedures/Significant Events:  6/5 echocardiogram: LVEF= 30-35%. The cavity size was severely dilated.  -Left ventricular diastolic Doppler parameters are consistent with restrictive filling.  -Right Ventricle: moderately reduced systolic function.  Left Atrium:-severely dilated.  Right Atrium: severely dilated. Mitral Valve:  regurgitation is moderate to severe     Objective: Vitals:   06/08/19 0038 06/08/19 0313 06/08/19 0515 06/08/19 1114  BP: 117/88  107/81 120/89  Pulse: 90  91 92  Resp: 18  18 19   Temp:   98 F (36.7 C) 97.8 F (36.6 C)  TempSrc:   Oral Oral  SpO2: 100%  97% 100%  Weight:  80.2 kg    Height:        Intake/Output Summary (Last 24 hours) at 06/08/2019 1224 Last data filed at 06/08/2019 1128 Gross per 24 hour  Intake 1203 ml  Output 900 ml  Net 303 ml   Filed Weights   06/07/19 0024 06/08/19 0313  Weight: 79.8 kg 80.2 kg    Exam:  . General: 58 y.o. year-old male well developed well nourished in no acute distress.  Alert and oriented x3. . Cardiovascular: Regular rate and rhythm with no rubs or gallops.  No thyromegaly or JVD noted.   Marland Kitchen Respiratory: Clear to auscultation with no wheezes or rales. Good inspiratory effort. . Abdomen: Soft nontender nondistended with normal bowel sounds x4 quadrants. . Musculoskeletal: Trace lower extremity edema. 2/4 pulses in all 4 extremities. Marland Kitchen Psychiatry: Mood is  irritable.   Data Reviewed: CBC: Recent Labs  Lab 06/06/19 1644 06/06/19 1823 06/07/19 0536 06/08/19 0848  WBC 5.4  --  4.6 4.6  HGB 11.2* 11.9* 10.6* 10.8*  HCT 33.7* 35.0* 30.9* 32.6*  MCV 92.1  --  89.8 92.1  PLT 181  --  164 619   Basic Metabolic Panel: Recent Labs  Lab 06/06/19 1644 06/06/19 1823 06/07/19 0536 06/08/19 0848  NA 127* 130* 127* 130*  K 4.5 4.3 3.9 3.7  CL 97*  --  98 98  CO2 20*  --  17* 23  GLUCOSE 147*  --  169* 134*  BUN 18  --  19 22*  CREATININE 1.56*  --  1.67* 1.87*  CALCIUM 9.0  --  8.8* 8.8*  MG  --   --   --  1.9   GFR: Estimated Creatinine Clearance: 45.3 mL/min (A) (by C-G formula based on SCr of 1.87 mg/dL (H)). Liver Function Tests: Recent Labs  Lab 06/06/19 1734  AST 21  ALT 20  ALKPHOS 77  BILITOT 1.0  PROT 8.0  ALBUMIN 3.3*   No results for input(s): LIPASE, AMYLASE in the last 168 hours. Recent Labs  Lab 06/06/19 1734  AMMONIA 47*   Coagulation Profile:  Recent Labs  Lab 06/06/19 1753 06/07/19 0536  INR 1.6* 1.5*   Cardiac Enzymes: Recent Labs  Lab 06/06/19 2112 06/06/19 2310 06/07/19 0536 06/07/19 1135  TROPONINI <0.03 <0.03 <0.03 0.03*   BNP (last 3 results) No results for input(s): PROBNP in the last 8760 hours. HbA1C: No results for input(s): HGBA1C in the last 72 hours. CBG: Recent Labs  Lab 06/07/19 1121 06/07/19 1600 06/07/19 2114 06/08/19 0611 06/08/19 1111  GLUCAP 139* 120* 146* 144* 170*   Lipid Profile: Recent Labs    06/08/19 0848  CHOL 100  HDL 36*  LDLCALC 52  TRIG 58  CHOLHDL 2.8   Thyroid Function Tests: No results for input(s): TSH, T4TOTAL, FREET4, T3FREE, THYROIDAB in the last 72 hours. Anemia Panel: Recent Labs    06/07/19 0536  VITAMINB12 344  FOLATE 15.0  FERRITIN 177  TIBC 326  IRON 58  RETICCTPCT 2.9   Urine analysis:    Component Value Date/Time   COLORURINE YELLOW 06/06/2019 2327   APPEARANCEUR CLEAR 06/06/2019 2327   LABSPEC 1.020 06/06/2019  2327   PHURINE 5.5 06/06/2019 2327   GLUCOSEU 100 (A) 06/06/2019 2327   HGBUR NEGATIVE 06/06/2019 2327   BILIRUBINUR NEGATIVE 06/06/2019 2327   BILIRUBINUR neg 05/08/2017 1503   KETONESUR NEGATIVE 06/06/2019 2327   PROTEINUR 100 (A) 06/06/2019 2327   UROBILINOGEN 0.2 05/08/2017 1503   UROBILINOGEN 1.0 04/26/2012 1328   NITRITE NEGATIVE 06/06/2019 2327   LEUKOCYTESUR TRACE (A) 06/06/2019 2327   Sepsis Labs: @LABRCNTIP (procalcitonin:4,lacticidven:4)  ) Recent Results (from the past 240 hour(s))  SARS Coronavirus 2     Status: None   Collection Time: 06/06/19  7:55 PM  Result Value Ref Range Status   SARS Coronavirus 2 NOT DETECTED NOT DETECTED Final    Comment: (NOTE) SARS-CoV-2 target nucleic acids are NOT DETECTED. The SARS-CoV-2 RNA is generally detectable in upper and lower respiratory specimens during the acute phase of infection.  Negative  results do not preclude SARS-CoV-2 infection, do not rule out co-infections with other pathogens, and should not be used as the sole basis for treatment or other patient management decisions.  Negative results must be combined with clinical observations, patient history, and epidemiological information. The expected result is Not Detected. Fact Sheet for Patients: http://www.biofiredefense.com/wp-content/uploads/2020/03/BIOFIRE-COVID -19-patients.pdf Fact Sheet for Healthcare Providers: http://www.biofiredefense.com/wp-content/uploads/2020/03/BIOFIRE-COVID -19-hcp.pdf This test is not yet approved or cleared by the [place] and  has been authorized for detection and/or diagnosis of SARS-CoV-2 by FDA under an Emergency Use Authorization (EUA).  This EUA will remain in effec t (meaning this test can be used) for the duration of  the COVID-19 declaration under Section 564(b)(1) of the Act, 21 U.S.C. section 360bbb-3(b)(1), unless the authorization is terminated or revoked sooner. Performed at [facility], [place]  [address], [place], [place] [address]   Urine culture     Status: None   Collection Time: 06/06/19 11:27 PM   Specimen: Urine, Random  Result Value Ref Range Status   Specimen Description URINE, RANDOM  Final   Special Requests NONE  Final   Culture   Final    NO GROWTH Performed at [facility], [place] [address] [place], [place] [address]    Report Status 06/08/2019 FINAL  Final      Studies: No results found.  Scheduled Meds: . apixaban  5 mg Oral BID  . atorvastatin  40 mg Oral q1800  . carvedilol  3.125 mg Oral BID WC  . folic acid  1 mg Oral Daily  . gabapentin  600 mg Oral BID  . insulin aspart  0-9 Units Subcutaneous TID WC  . insulin glargine  5 Units Subcutaneous Daily  . loratadine  10 mg Oral Daily  . multivitamin with minerals  1 tablet Oral Daily  . nicotine  14 mg Transdermal Daily  . pantoprazole  40 mg Oral Daily  . thiamine  100 mg Oral Daily   Or  . thiamine  100 mg Intravenous Daily    Continuous Infusions:   LOS: 2 days     Kayleen Memos, MD [facility] Pager [phone]  If 7PM-7AM, please contact night-coverage [email or web address] Password [facility] 06/08/2019, 12:24 PM

## 2019-06-08 NOTE — Progress Notes (Signed)
Patient is currently refusing to have his bed alarm on. Patient states he was upset that they turned the bed alarm on last night. Patient educated on fall risk, and to call for assistance if needed.

## 2019-06-09 ENCOUNTER — Encounter (HOSPITAL_COMMUNITY): Payer: Self-pay | Admitting: Physician Assistant

## 2019-06-09 DIAGNOSIS — F1490 Cocaine use, unspecified, uncomplicated: Secondary | ICD-10-CM

## 2019-06-09 DIAGNOSIS — N179 Acute kidney failure, unspecified: Secondary | ICD-10-CM

## 2019-06-09 DIAGNOSIS — N183 Chronic kidney disease, stage 3 (moderate): Secondary | ICD-10-CM

## 2019-06-09 DIAGNOSIS — I480 Paroxysmal atrial fibrillation: Secondary | ICD-10-CM

## 2019-06-09 DIAGNOSIS — R079 Chest pain, unspecified: Secondary | ICD-10-CM

## 2019-06-09 DIAGNOSIS — I5023 Acute on chronic systolic (congestive) heart failure: Secondary | ICD-10-CM

## 2019-06-09 LAB — BASIC METABOLIC PANEL
Anion gap: 11 (ref 5–15)
BUN: 25 mg/dL — ABNORMAL HIGH (ref 6–20)
CO2: 20 mmol/L — ABNORMAL LOW (ref 22–32)
Calcium: 8.6 mg/dL — ABNORMAL LOW (ref 8.9–10.3)
Chloride: 99 mmol/L (ref 98–111)
Creatinine, Ser: 1.9 mg/dL — ABNORMAL HIGH (ref 0.61–1.24)
GFR calc Af Amer: 44 mL/min — ABNORMAL LOW (ref >60)
GFR calc non Af Amer: 38 mL/min — ABNORMAL LOW (ref >60)
Glucose, Bld: 124 mg/dL — ABNORMAL HIGH (ref 70–99)
Potassium: 4.3 mmol/L (ref 3.5–5.1)
Sodium: 130 mmol/L — ABNORMAL LOW (ref 135–145)

## 2019-06-09 LAB — CBC
HCT: 32.5 % — ABNORMAL LOW (ref 39.0–52.0)
Hemoglobin: 10.9 g/dL — ABNORMAL LOW (ref 13.0–17.0)
MCH: 31.1 pg (ref 26.0–34.0)
MCHC: 33.5 g/dL (ref 30.0–36.0)
MCV: 92.9 fL (ref 80.0–100.0)
Platelets: 186 10*3/uL (ref 150–400)
RBC: 3.5 MIL/uL — ABNORMAL LOW (ref 4.22–5.81)
RDW: 16.9 % — ABNORMAL HIGH (ref 11.5–15.5)
WBC: 5.3 10*3/uL (ref 4.0–10.5)
nRBC: 0 % (ref 0.0–0.2)

## 2019-06-09 LAB — MAGNESIUM: Magnesium: 2 mg/dL (ref 1.7–2.4)

## 2019-06-09 LAB — GLUCOSE, CAPILLARY
Glucose-Capillary: 154 mg/dL — ABNORMAL HIGH (ref 70–99)
Glucose-Capillary: 155 mg/dL — ABNORMAL HIGH (ref 70–99)
Glucose-Capillary: 108 mg/dL — ABNORMAL HIGH (ref 70–99)
Glucose-Capillary: 119 mg/dL — ABNORMAL HIGH (ref 70–99)

## 2019-06-09 LAB — TROPONIN I: Troponin I: 0.03 ng/mL (ref <0.03)

## 2019-06-09 MED ORDER — MORPHINE SULFATE (PF) 2 MG/ML IV SOLN
2.0000 mg | Freq: Once | INTRAVENOUS | Status: AC | PRN
  Administered 2019-06-09: 2 mg via INTRAVENOUS
  Filled 2019-06-09: qty 1

## 2019-06-09 MED ORDER — HYDROMORPHONE HCL 1 MG/ML IJ SOLN
0.5000 mg | Freq: Once | INTRAMUSCULAR | Status: AC
  Administered 2019-06-09: 0.5 mg via INTRAVENOUS
  Filled 2019-06-09: qty 0.5

## 2019-06-09 MED ORDER — ISOSORBIDE MONONITRATE ER 30 MG PO TB24
15.0000 mg | ORAL_TABLET | Freq: Every day | ORAL | Status: DC
  Administered 2019-06-09: 15 mg via ORAL
  Filled 2019-06-09 (×2): qty 1

## 2019-06-09 MED ORDER — SODIUM CHLORIDE 0.9 % IV BOLUS
250.0000 mL | Freq: Once | INTRAVENOUS | Status: AC
  Administered 2019-06-09: 250 mL via INTRAVENOUS

## 2019-06-09 NOTE — Progress Notes (Signed)
Patient compplaining of 10/10 chest pain and SOB. Pt was placed on 2 L of 02, STAT EKG obtained, and MD was paged. MD gave orders to give PRN Nitro. Pt received Nitro x 3 and states pain is now a 7 and now has pain in left arm. MD paged again and will place orders for a troponin and Morphine. Will continue to monitor.

## 2019-06-09 NOTE — Progress Notes (Signed)
PROGRESS NOTE  Brad [name] PYP:[id number] DOB: 07/18/1961 DOA: 06/06/2019 PCP: Brad Rakes, MD  HPI/Recap of past 24 hours: Brief Narrative:  [name] PMHx HTN, paroxysmal A. fib/flutter,  chronic combined systolic and diastolic congestive heart failure/nonischemic cardiomyopathy, CKD stage III, type 2 diabetes, hepatitis C and cirrhosis, HLD, IV drug abuse ,prostate cancer, obese  and conditions listed below presenting to the hospital for evaluation of generalized weakness and chest pain.+ cocaine. He smokes 1/2 pack of cigarettes daily. Troponin negative. Received IV Lasix 40 mg in the ED for elevated BNP>1000. Admitted for chest pain r/o ACS.  Hospital course complicated by persistent chest pain with negative troponin x5 and EKG unremarkable for specific ST-T changes.  06/09/19: Patient seen and examined at bedside.  Reports severe chest pain and requesting Dilaudid for relief.  Restarted on Imdur a small dose due to soft blood pressures.  Torsemide on hold due to AKI.  Cardiology consulted to further assess.   Assessment/Plan: Principal Problem:   Acute exacerbation of CHF (congestive heart failure) (HCC) Active Problems:   Anemia   Hyponatremia   Chest pain   Acute hepatic encephalopathy  Chest pain rule out ACS Recent cocaine use with positive UDS during this admission Troponin negative x5 Twelve-lead EKG unremarkable for specific ST-T changes Last 2D echo done on 05/27/2019 showed LVEF 30 to 35% On Eliquis for A. Fib Cardiology consulted to further assess  AKI on CKD 3 Baseline creatinine appears to be 1.6 with GFR 50 Creatinine today 1.90 with GFR 44 Avoid nephrotoxins Torsemide on hold due to AKI Defer to cardiology to start Monitor urine output Repeat BMP in the morning  Acute on chronic combined systolic and diastolic CHF Last 2D echo done on 05/27/2019 showed LVEF 30 to 35% Torsemide on hold due to AKI Continue strict I's and O's and daily weight  Continue Coreg 3.125 mg twice daily Cardiology consult  Euvolemic hyponatremia Sodium 130 possibly related to fluid retention  Monitor and continue fluid restriction  Paroxysmal A. fib Continue Coreg and Eliquis for primary CVA prevention Rate has been controlled  Hypertension Blood pressure stable  Coreg, Imdur restarted  HLD - Lipitor 40 mg daily -Lipid panel pending  Hepatic encephalopathy -Resolved  Normocytic anemia Hemoglobin 10.9 which has been stable for the last 3 days No sign of overt bleeding  Type 2 diabetes with hyperglycemia Hemoglobin A1c 7.3 on 05/24/2019 Continue Lantus and insulin sliding scale  Avoid hypoglycemia   Tobacco abuse -Tobacco cessation counseling at bedside -Continue nicotine patch  EtOH abuse -CIWA protocol -Alcohol cessation counseling at bedside  Polysubstance abuse including cocaine - On admission rapid urine drug screen positive for cocaine -Polysubstance abuse cessation counseling at bedside    DVT prophylaxis: Eliquis Code Status: Full Family Communication: None Disposition Plan:  Discharge possibly tomorrow or when cardiology signs off.   Consultants:  Cardiology   Procedures/Significant Events:  6/5 echocardiogram: LVEF= 30-35%. The cavity size was severely dilated.  -Left ventricular diastolic Doppler parameters are consistent with restrictive filling.  -Right Ventricle: moderately reduced systolic function.  Left Atrium:-severely dilated.  Right Atrium: severely dilated. Mitral Valve:  regurgitation is moderate to severe     Objective: Vitals:   06/09/19 0154 06/09/19 0317 06/09/19 0652 06/09/19 0832  BP: (!) 128/93 118/82 130/68 121/81  Pulse: 86 85 91 71  Resp:  18    Temp:  97.9 F (36.6 C)    TempSrc:  Oral    SpO2: 100% 99% 100% 97%  Weight:  Height:        Intake/Output Summary (Last 24 hours) at 06/09/2019 0908 Last data filed at 06/09/2019 0840 Gross per 24 hour   Intake 1523.78 ml  Output 1600 ml  Net -76.22 ml   Filed Weights   06/07/19 0024 06/08/19 0313 06/09/19 0122  Weight: 79.8 kg 80.2 kg 81.3 kg    Exam:  . General: 58 y.o. year-old male well developed well nourished in no acute distress.  Alert and oriented times 3. . Cardiovascular: Regular rate and rhythm with no rubs or gallop.   Marland Kitchen  Respiratory: Clear intensity showed no wheezes or rales.  Good inspiratory effort.   . Abdomen: Soft nontender nondistended with normal bowel sounds x4.  . Musculoskeletal: Trace lower extremity edema bilaterally.   Marland Kitchen Psychiatry: Mood is anxious.   Data Reviewed: CBC: Recent Labs  Lab 06/06/19 1644 06/06/19 1823 06/07/19 0536 06/08/19 0848 06/09/19 0059  WBC 5.4  --  4.6 4.6 5.3  HGB 11.2* 11.9* 10.6* 10.8* 10.9*  HCT 33.7* 35.0* 30.9* 32.6* 32.5*  MCV 92.1  --  89.8 92.1 92.9  PLT 181  --  164 175 151   Basic Metabolic Panel: Recent Labs  Lab 06/06/19 1644 06/06/19 1823 06/07/19 0536 06/08/19 0848 06/09/19 0059  NA 127* 130* 127* 130* 130*  K 4.5 4.3 3.9 3.7 4.3  CL 97*  --  98 98 99  CO2 20*  --  17* 23 20*  GLUCOSE 147*  --  169* 134* 124*  BUN 18  --  19 22* 25*  CREATININE 1.56*  --  1.67* 1.87* 1.90*  CALCIUM 9.0  --  8.8* 8.8* 8.6*  MG  --   --   --  1.9 2.0   GFR: Estimated Creatinine Clearance: 44.6 mL/min (A) (by C-G formula based on SCr of 1.9 mg/dL (H)). Liver Function Tests: Recent Labs  Lab 06/06/19 1734  AST 21  ALT 20  ALKPHOS 77  BILITOT 1.0  PROT 8.0  ALBUMIN 3.3*   No results for input(s): LIPASE, AMYLASE in the last 168 hours. Recent Labs  Lab 06/06/19 1734  AMMONIA 47*   Coagulation Profile: Recent Labs  Lab 06/06/19 1753 06/07/19 0536  INR 1.6* 1.5*   Cardiac Enzymes: Recent Labs  Lab 06/06/19 2310 06/07/19 0536 06/07/19 1135 06/08/19 1626 06/09/19 0059  TROPONINI <0.03 <0.03 0.03* 0.03* 0.03*   BNP (last 3 results) No results for input(s): PROBNP in the last 8760 hours.  HbA1C: No results for input(s): HGBA1C in the last 72 hours. CBG: Recent Labs  Lab 06/08/19 0611 06/08/19 1111 06/08/19 1558 06/08/19 2114 06/09/19 0604  GLUCAP 144* 170* 95 133* 154*   Lipid Profile: Recent Labs    06/08/19 0848  CHOL 100  HDL 36*  LDLCALC 52  TRIG 58  CHOLHDL 2.8   Thyroid Function Tests: No results for input(s): TSH, T4TOTAL, FREET4, T3FREE, THYROIDAB in the last 72 hours. Anemia Panel: Recent Labs    06/07/19 0536  VITAMINB12 344  FOLATE 15.0  FERRITIN 177  TIBC 326  IRON 58  RETICCTPCT 2.9   Urine analysis:    Component Value Date/Time   COLORURINE YELLOW 06/06/2019 2327   APPEARANCEUR CLEAR 06/06/2019 2327   LABSPEC 1.020 06/06/2019 2327   PHURINE 5.5 06/06/2019 2327   GLUCOSEU 100 (A) 06/06/2019 2327   HGBUR NEGATIVE 06/06/2019 2327   BILIRUBINUR NEGATIVE 06/06/2019 2327   BILIRUBINUR neg 05/08/2017 1503   KETONESUR NEGATIVE 06/06/2019 2327   PROTEINUR 100 (A) 06/06/2019  2327   UROBILINOGEN 0.2 05/08/2017 1503   UROBILINOGEN 1.0 04/26/2012 1328   NITRITE NEGATIVE 06/06/2019 2327   LEUKOCYTESUR TRACE (A) 06/06/2019 2327   Sepsis Labs: @LABRCNTIP (procalcitonin:4,lacticidven:4)  ) Recent Results (from the past 240 hour(s))  SARS Coronavirus 2     Status: None   Collection Time: 06/06/19  7:55 PM  Result Value Ref Range Status   SARS Coronavirus 2 NOT DETECTED NOT DETECTED Final    Comment: (NOTE) SARS-CoV-2 target nucleic acids are NOT DETECTED. The SARS-CoV-2 RNA is generally detectable in upper and lower respiratory specimens during the acute phase of infection.  Negative  results do not preclude SARS-CoV-2 infection, do not rule out co-infections with other pathogens, and should not be used as the sole basis for treatment or other patient management decisions.  Negative results must be combined with clinical observations, patient history, and epidemiological information. The expected result is Not Detected. Fact Sheet  for Patients: http://www.biofiredefense.com/wp-content/uploads/2020/03/BIOFIRE-COVID -19-patients.pdf Fact Sheet for Healthcare Providers: http://www.biofiredefense.com/wp-content/uploads/2020/03/BIOFIRE-COVID -19-hcp.pdf This test is not yet approved or cleared by the [place] and  has been authorized for detection and/or diagnosis of SARS-CoV-2 by FDA under an Emergency Use Authorization (EUA).  This EUA will remain in effec t (meaning this test can be used) for the duration of  the COVID-19 declaration under Section 564(b)(1) of the Act, 21 U.S.C. section 360bbb-3(b)(1), unless the authorization is terminated or revoked sooner. Performed at [facility], [place] [address] [place], [place] [address]   Urine culture     Status: None   Collection Time: 06/06/19 11:27 PM   Specimen: Urine, Random  Result Value Ref Range Status   Specimen Description URINE, RANDOM  Final   Special Requests NONE  Final   Culture   Final    NO GROWTH Performed at [facility], [place] [address], [place], [place] [address]    Report Status 06/08/2019 FINAL  Final      Studies: No results found.  Scheduled Meds: . apixaban  5 mg Oral BID  . atorvastatin  40 mg Oral q1800  . carvedilol  3.125 mg Oral BID WC  . folic acid  1 mg Oral Daily  . gabapentin  600 mg Oral BID  . insulin aspart  0-9 Units Subcutaneous TID WC  . insulin glargine  5 Units Subcutaneous Daily  . isosorbide mononitrate  15 mg Oral Daily  . loratadine  10 mg Oral Daily  . multivitamin with minerals  1 tablet Oral Daily  . nicotine  14 mg Transdermal Daily  . pantoprazole  40 mg Oral Daily  . thiamine  100 mg Oral Daily   Or  . thiamine  100 mg Intravenous Daily    Continuous Infusions:   LOS: 3 days     Kayleen Memos, MD [facility] Pager [phone]  If 7PM-7AM, please contact night-coverage [email or web address] Password [email or web address] 06/09/2019, 9:08 AM
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
## 2019-06-10 ENCOUNTER — Telehealth (HOSPITAL_COMMUNITY): Payer: Self-pay

## 2019-06-10 LAB — CBC
HCT: 31.5 % — ABNORMAL LOW (ref 39.0–52.0)
Hemoglobin: 10.6 g/dL — ABNORMAL LOW (ref 13.0–17.0)
MCH: 31.3 pg (ref 26.0–34.0)
MCHC: 33.7 g/dL (ref 30.0–36.0)
MCV: 92.9 fL (ref 80.0–100.0)
Platelets: 171 10*3/uL (ref 150–400)
RBC: 3.39 MIL/uL — ABNORMAL LOW (ref 4.22–5.81)
RDW: 17.4 % — ABNORMAL HIGH (ref 11.5–15.5)
WBC: 4.7 10*3/uL (ref 4.0–10.5)
nRBC: 0 % (ref 0.0–0.2)

## 2019-06-10 LAB — BASIC METABOLIC PANEL
Anion gap: 7 (ref 5–15)
BUN: 27 mg/dL — ABNORMAL HIGH (ref 6–20)
CO2: 24 mmol/L (ref 22–32)
Calcium: 8.8 mg/dL — ABNORMAL LOW (ref 8.9–10.3)
Chloride: 101 mmol/L (ref 98–111)
Creatinine, Ser: 1.74 mg/dL — ABNORMAL HIGH (ref 0.61–1.24)
GFR calc Af Amer: 49 mL/min — ABNORMAL LOW (ref >60)
GFR calc non Af Amer: 42 mL/min — ABNORMAL LOW (ref >60)
Glucose, Bld: 136 mg/dL — ABNORMAL HIGH (ref 70–99)
Potassium: 4.6 mmol/L (ref 3.5–5.1)
Sodium: 132 mmol/L — ABNORMAL LOW (ref 135–145)

## 2019-06-10 LAB — GLUCOSE, CAPILLARY
Glucose-Capillary: 140 mg/dL — ABNORMAL HIGH (ref 70–99)
Glucose-Capillary: 132 mg/dL — ABNORMAL HIGH (ref 70–99)

## 2019-06-10 LAB — MAGNESIUM: Magnesium: 2.2 mg/dL (ref 1.7–2.4)

## 2019-06-10 MED ORDER — OXYCODONE HCL 5 MG PO TABS: 5.0000 mg | ORAL_TABLET | Freq: Two times a day (BID) | ORAL | 0 refills | Status: DC | PRN

## 2019-06-10 MED ORDER — FUROSEMIDE 10 MG/ML IJ SOLN
40.0000 mg | Freq: Once | INTRAMUSCULAR | Status: AC
  Administered 2019-06-10: 40 mg via INTRAVENOUS
  Filled 2019-06-10: qty 4

## 2019-06-10 MED ORDER — NICOTINE 14 MG/24HR TD PT24: 14.0000 mg | MEDICATED_PATCH | Freq: Every day | TRANSDERMAL | 0 refills | Status: DC

## 2019-06-10 MED ORDER — DILTIAZEM HCL 30 MG PO TABS: 30.0000 mg | ORAL_TABLET | Freq: Two times a day (BID) | ORAL | 0 refills | Status: DC

## 2019-06-10 MED ORDER — FERROUS SULFATE 325 (65 FE) MG PO TABS
325.0000 mg | ORAL_TABLET | Freq: Every day | ORAL | Status: DC
  Administered 2019-06-10: 325 mg via ORAL
  Filled 2019-06-10: qty 1

## 2019-06-10 MED ORDER — FERROUS SULFATE 325 (65 FE) MG PO TABS: 325.0000 mg | ORAL_TABLET | Freq: Every day | ORAL | 0 refills | Status: DC

## 2019-06-10 MED ORDER — ISOSORBIDE MONONITRATE ER 30 MG PO TB24
30.0000 mg | ORAL_TABLET | Freq: Every day | ORAL | Status: DC
  Administered 2019-06-10: 30 mg via ORAL
  Filled 2019-06-10: qty 1

## 2019-06-10 MED ORDER — DILTIAZEM HCL 30 MG PO TABS
30.0000 mg | ORAL_TABLET | Freq: Two times a day (BID) | ORAL | Status: DC
  Administered 2019-06-10: 30 mg via ORAL
  Filled 2019-06-10: qty 1

## 2019-06-10 MED ORDER — ISOSORBIDE MONONITRATE ER 30 MG PO TB24: 30.0000 mg | ORAL_TABLET | Freq: Every day | ORAL | 0 refills | Status: DC

## 2019-06-10 MED ORDER — TORSEMIDE 20 MG PO TABS: 20.0000 mg | ORAL_TABLET | Freq: Every day | ORAL | 0 refills | Status: DC

## 2019-06-10 MED ORDER — TORSEMIDE 20 MG PO TABS
20.0000 mg | ORAL_TABLET | Freq: Every day | ORAL | Status: DC
  Administered 2019-06-10: 20 mg via ORAL
  Filled 2019-06-10: qty 1

## 2019-06-10 MED ORDER — ADULT MULTIVITAMIN W/MINERALS CH: 1.0000 | ORAL_TABLET | Freq: Every day | ORAL | 0 refills | Status: DC

## 2019-06-10 MED FILL — oxyCODONE HCL 5 MG TABS: 5 | 5 days supply | Qty: 10 | Fill #0

## 2019-06-10 MED FILL — NICOTINE 14 MG/24HR PATCH: 14 | 28 days supply | Qty: 28 | Fill #0

## 2019-06-10 MED FILL — dilTIAZem HCL 30 MG TABS: 30 | 30 days supply | Qty: 60 | Fill #0

## 2019-06-10 MED FILL — CERTAVITE-ANTIOXIDANT TAB: 30 days supply | Qty: 30 | Fill #0

## 2019-06-10 MED FILL — TORSEMIDE 20 MG TABLET: 20 | 30 days supply | Qty: 30 | Fill #0

## 2019-06-10 MED FILL — FERROUS SULFATE 325 MG TAB: 325 (65 FE) | 30 days supply | Qty: 30 | Fill #0

## 2019-06-10 MED FILL — ISOSORBIDE MN ER 30 MG TAB: 30 | 30 days supply | Qty: 30 | Fill #0

## 2019-06-10 NOTE — TOC Transition Note (Signed)
Transition of Care [facility]) - CM/SW Discharge Note   Patient Details  Name: Brad [name] MRN: [id number] Date of Birth: [birth year]  Transition of Care [facility]) CM/SW Contact:  Bethena Roys, RN Phone Number: 06/10/2019, 12:38 PM   Clinical Narrative:  Medications received from Trans[facility] Care Pharmacy. Patient is now stating unable to return to his apartment. Shelter list provided to patient. Patient states he will be able to return home with a cousin. Cousin to pick him up for transport home around 1500. Staff RN aware. No further needs from CM at this time.    Final next level of care: Home/Self Care Barriers to Discharge: No Barriers Identified   Discharge Plan and Services In-house Referral: NA Discharge Planning Services: CM Consult Post Acute Care Choice: Durable Medical Equipment          DME Arranged: Kasandra Knudsen DME Agency: [employer] Date DME Agency Contacted: 06/10/19 Time DME Agency Contacted: 58 Representative spoke with at DME Agency: [place]: NA          Social Determinants of Health ([place]) Interventions     Readmission Risk Interventions Readmission Risk Prevention Plan 05/28/2019  Transportation Screening Complete  Medication Review Press photographer) Complete  PCP or Specialist appointment within 3-5 days of discharge Complete  HRI or [place] Complete  SW Recovery Care/Counseling Consult Complete  [place] Not Applicable  Some recent data might be hidden

## 2019-06-10 NOTE — Progress Notes (Signed)
Pt has been demanding that he has Dilaudid IV Q6H to give. Explained that he had it on day shift around 2Pm and had it ordered one time only. PT is requesting it some more and demanding this nurse to call MD. Page MD see care order:no diluadid this time.

## 2019-06-10 NOTE — Progress Notes (Signed)
Physical Therapy Treatment Patient Details Name: Brad [name] MRN: [id number] DOB: 1960/12/31 Today's Date: 06/10/2019    History of Present Illness 58 yo admitted with generalized weakness and CP with CHf exacerbation positive for cocaine. PMHx: HTN, PAFib, CHF, NICM, CKD, DM, hep C, cirrhosis, HLD, IVDU, prostate CA, noncompliance    PT Comments    Pt reports eager to D/C and states he thinks he is going to stay with a friend who has a flight of stairs. Pt with increased gait tolerance and stability with encouragement to obtain cane if not provided at D/C to prevent falls and maintain for stability with gait. Pt reports no CP or SOB with right ankle pain with gait.  90-93% on RA with HR 94-100    Follow Up Recommendations  No PT follow up;Supervision - Intermittent     Equipment Recommendations  Cane    Recommendations for Other Services       Precautions / Restrictions Precautions Precautions: Fall Precaution Comments: bad right with falls at times due to ankle instability Restrictions Weight Bearing Restrictions: No    Mobility  Bed Mobility Overal bed mobility: Modified Independent                Transfers Overall transfer level: Modified independent                  Ambulation/Gait Ambulation/Gait assistance: Supervision Gait Distance (Feet): 400 Feet Assistive device: None Gait Pattern/deviations: Step-through pattern;Decreased dorsiflexion - right;Decreased stride length   Gait velocity interpretation: >2.62 ft/sec, indicative of community ambulatory General Gait Details: pt with good stabilty with gait this session without LOB or buckling of RLE with increased gait tolerance. Pt with one slight imbalance but did not require external assist and continue to recommend cane for maximum stability with gait   Stairs Stairs: Yes Stairs assistance: Supervision Stair Management: One rail Right;Step to pattern;Alternating pattern;Forwards Number  of Stairs: 10 General stair comments: alternating ascent with step to descent with cues for sequence to decrease strain on Rt ankle with stairs   Wheelchair Mobility    Modified [name] (Stroke Patients Only)       Balance Overall balance assessment: Needs assistance   Sitting balance-Leahy Scale: Good     Standing balance support: No upper extremity supported;During functional activity Standing balance-Leahy Scale: Good Standing balance comment: able to stand and walk without physical assist                            Cognition Arousal/Alertness: Awake/alert Behavior During Therapy: WFL for tasks assessed/performed Overall Cognitive Status: Within Functional Limits for tasks assessed                                        Exercises      General Comments        Pertinent Vitals/Pain Pain Score: 4  Pain Location: right ankle with gait Pain Descriptors / Indicators: Aching Pain Intervention(s): Limited activity within patient's tolerance;Repositioned;Monitored during session    Home Living                      Prior Function            PT Goals (current goals can now be found in the care plan section) Progress towards PT goals: Progressing toward goals    Frequency  PT Plan Current plan remains appropriate    Co-evaluation              AM-PAC PT "6 Clicks" Mobility   Outcome Measure  Help needed turning from your back to your side while in a flat bed without using bedrails?: None Help needed moving from lying on your back to sitting on the side of a flat bed without using bedrails?: None Help needed moving to and from a bed to a chair (including a wheelchair)?: None Help needed standing up from a chair using your arms (e.g., wheelchair or bedside chair)?: None Help needed to walk in hospital room?: None Help needed climbing 3-5 steps with a railing? : None 6 Click Score: 24    End of Session  Equipment Utilized During Treatment: Gait belt Activity Tolerance: Patient tolerated treatment well Patient left: with call bell/phone within reach;in bed Nurse Communication: Mobility status PT Visit Diagnosis: Unsteadiness on feet (R26.81);Other abnormalities of gait and mobility (R26.89);Muscle weakness (generalized) (M62.81);Difficulty in walking, not elsewhere classified (R26.2);Pain     Time: 8250-5397 PT Time Calculation (min) (ACUTE ONLY): 14 min  Charges:  $Gait Training: 8-22 mins                     Shandell Giovanni Pam Drown, PT Acute Rehabilitation Services Pager: [phone] Office: [place] 06/10/2019, 12:52 PM
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
## 2019-06-10 NOTE — Discharge Summary (Addendum)
Discharge Summary  Brad [name] OJJ:[id number] DOB: 06-11-1961  PCP: Charlott Rakes, MD  Admit date: 06/06/2019 Discharge date: 06/10/2019  Time spent: 35 minutes  Recommendations for Outpatient Follow-up:  1. Follow-up with your cardiologist 06/22/2019 at 0900 AM. TeleVisit. 2. Follow-up with your primary care provider 3. Take your medications as prescribed 4. Abstain from cocaine use  Discharge Diagnoses:  Active Hospital Problems   Diagnosis Date Noted   Acute exacerbation of CHF (congestive heart failure) ([place]) 06/06/2019   Chest pain 06/07/2019   Acute hepatic encephalopathy 06/07/2019   Hyponatremia 10/31/2018   Anemia 07/05/2016    Resolved Hospital Problems  No resolved problems to display.    Discharge Condition: Stable  Diet recommendation: Heart healthy low-sodium diet  Vitals:   06/10/19 0950 06/10/19 1155  BP: 133/83 (!) 124/91  Pulse: 90 89  Resp:    Temp:  97.9 F (36.6 C)  SpO2:  100%    History of present illness:  Brief Narrative: 58 y.o.BMPMHxHTN,paroxysmal A. fib/flutter, chronic combined systolic and diastolic congestive heart failure/nonischemic cardiomyopathy, CKD stage III, type 2 diabetes, hepatitis C and cirrhosis, HLD, IV drug abuse,prostate cancer,obese and conditions listed below presenting to the hospital for evaluation of generalized weakness and chest pain.+ cocaine. He smokes 1/2 pack of cigarettes daily. Troponin negative. Received IV Lasix 40 mg in the ED for elevated BNP>1000. Admitted for chest pain r/o ACS.  Hospital course complicated by persistent chest pain with negative troponin x5 and EKG unremarkable for specific ST-T changes.  Cardiology consulted and followed.  Chest pain not consistent with ACS.  06/10/19: Patient seen and examined at his bedside.  Reports chest pain that is reproducible on palpation and improved with opiates.  Vital signs reviewed and are stable.  Currently on Imdur and torsemide.   Labs also reviewed, noted improvement of renal function.  On the day of discharge, the patient was hemodynamically stable.  He will need to follow-up with his cardiologist and his PCP post radiation.  He will also need to completely abstain from cocaine use.  Hospital Course:  Principal Problem:   Acute exacerbation of CHF (congestive heart failure) (HCC) Active Problems:   Anemia   Hyponatremia   Chest pain   Acute hepatic encephalopathy   Chest pain, not consistent with ACS.  Complains of chest pain that is reproducible on palpation and improved with opiates. Cardiology consulted and followed.  Chest pain is not consistent with ACS. Ongoing cocaine use.  Patient counseled on the importance of cocaine cessation.  Beta-blocker stopped as recommended by cardiology due to ongoing cocaine use. Troponin negative x5.  Twelve-lead EKG unremarkable for specific ST-T changes. On Eliquis for paroxysmal A. fib Follow-up with your cardiologist outpatient (10) tablets oxycodone 5 mg BID prn for severe pain at discharge.  Improving AKI on CKD 3 Baseline creatinine appears to be 1.6 with GFR 50 Creatinine improved 1.7 with GFR of 49 Restarted on his diuretics torsemide 20 mg daily Potassium normal at 4.6. Repeat BMP on Wednesday, June 15 2019 Follow-up with your primary care provider Continue to avoid nephrotoxins such as NSAIDs.  Acute on chronic combined systolic and diastolic CHF Last 2D echo done on 05/27/2019 showed LVEF 30 to 35% Torsemide resumed with improvement of AKI Coreg stopped due to ongoing cocaine use. Follow-up with your cardiologist outpatient  Improving euvolemic hyponatremia Sodium improved today 132 from 130 yesterday Asymptomatic Follow-up with your PCP  Repeat labs Wednesday, June 15, 2019   Paroxysmal A. Fib Rate is stable Coreg  has been stopped in the setting of ongoing cocaine Cardizem po 30 mg BID started. Continue Eliquis for CVA prevention Follow-up  with your cardiologist  Hypertension Blood pressure is stable Continue Imdur, torsemide  HLD -LDL 52 on 06/08/2019 Continue Lipitor 40 mg daily   Hepatic encephalopathy -Resolved  Anemia of chronic disease/iron deficiency anemia Hemoglobin 10.6 on 06/10/2019 No sign of overt bleeding Iron studies remarkable for iron deficiency Started on ferrous sulfate 325 mg daily Follow-up with PCP   Type 2 diabetes with hyperglycemia Hemoglobin A1c 7.3 on 05/24/2019 Resume home antiglycemic medication  Tobacco abuse -Tobacco cessation counseling at bedside -Continue nicotine patch  EtOH abuse -Completed CIWA protocol -Alcohol cessation counseling at bedside  Polysubstance abuse including cocaine -On admission rapid urine drug screenpositivefor cocaine -Polysubstance abuse cessation counseling at bedside  Medical noncompliance Patient advised to completely abstain from cocaine use and to follow-up with his providers and keep his appointments    Code Status:Full   Consultants: Cardiology   Procedures/Significant Events: 6/5 echocardiogram:LVEF=30-35%. The cavity size was severely dilated.  -Left ventricular diastolic Doppler parameters are consistent with restrictive filling. -Right Ventricle: moderately reduced systolic function.  Left Atrium:-severely dilated.  Right Atrium: severely dilated. Mitral Valve: regurgitation is moderate to severe    Discharge Exam: BP (!) 124/91 (BP Location: Right Arm)    Pulse 89    Temp 97.9 F (36.6 C) (Oral)    Resp 18    Ht '5\' 11"'  (1.803 m)    Wt 81.6 kg Comment: scale c   SpO2 100%    BMI 25.10 kg/m   General: 58 y.o. year-old male well developed well nourished in no acute distress.  Alert and oriented x3.  Cardiovascular: Regular rate and rhythm with no rubs or gallops.  No thyromegaly or JVD noted.    Respiratory: Clear to auscultation with no wheezes or rales. Good inspiratory effort.  Abdomen:  Soft nontender with normal bowel sounds x4 quadrants.  Musculoskeletal: Trace lower extremity edema. 2/4 pulses in all 4 extremities.  Psychiatry: Mood is appropriate for condition and setting  Discharge Instructions You were cared for by a hospitalist during your hospital stay. If you have any questions about your discharge medications or the care you received while you were in the hospital after you are discharged, you can call the unit and asked to speak with the hospitalist on call if the hospitalist that took care of you is not available. Once you are discharged, your primary care physician will handle any further medical issues. Please note that NO REFILLS for any discharge medications will be authorized once you are discharged, as it is imperative that you return to your primary care physician (or establish a relationship with a primary care physician if you do not have one) for your aftercare needs so that they can reassess your need for medications and monitor your lab values.   Allergies as of 06/10/2019      Reactions   Angiotensin Receptor Blockers Anaphylaxis, Other (See Comments)   (Angioedema also with Lisinopril, therefore ARB's are contraindicated)   Lisinopril Anaphylaxis, Other (See Comments)   Throat swells   Pamelor [nortriptyline Hcl] Anaphylaxis, Swelling   Throat swells      Medication List    STOP taking these medications   amitriptyline 75 MG tablet Commonly known as: ELAVIL   carvedilol 3.125 MG tablet Commonly known as: COREG   diclofenac sodium 1 % Gel Commonly known as: Voltaren   losartan 25 MG tablet Commonly known as:  COZAAR   potassium chloride SA 20 MEQ tablet Commonly known as: K-DUR     TAKE these medications   Accu-Chek FastClix Lancets Misc Use as directed three times daily   Accu-Chek Guide w/Device Kit 1 each by Does not apply route 3 (three) times daily.   apixaban 5 MG Tabs tablet Commonly known as: ELIQUIS Take 1 tablet (5 mg  total) by mouth 2 (two) times daily. Notes to patient: 6/19 pm   atorvastatin 40 MG tablet Commonly known as: LIPITOR Take 1 tablet (40 mg total) by mouth daily. What changed: when to take this Notes to patient: 6/20 pm   cetirizine 10 MG tablet Commonly known as: ZYRTEC TAKE 1 TABLET (10 MG TOTAL) BY MOUTH DAILY. Notes to patient: 6/20 am   diltiazem 30 MG tablet Commonly known as: CARDIZEM Take 1 tablet (30 mg total) by mouth 2 (two) times daily. Notes to patient: 06/19 pm   Easy Comfort Pen Needles 31G X 8 MM Misc Generic drug: Insulin Pen Needle USE AS DIRECTED TWICE A DAY   ferrous sulfate 325 (65 FE) MG tablet Take 1 tablet (325 mg total) by mouth daily with breakfast. Notes to patient: 5/46 am   folic acid 1 MG tablet Commonly known as: FOLVITE Take 1 tablet (1 mg total) by mouth daily. Notes to patient: 6/20 am   gabapentin 300 MG capsule Commonly known as: NEURONTIN TAKE 2 CAPSULES (600 MG TOTAL) BY MOUTH 2 (TWO) TIMES DAILY. Notes to patient: 6/19 pm   glucose blood test strip Commonly known as: Accu-Chek Guide Use as directed three times daily   hydrALAZINE 25 MG tablet Commonly known as: APRESOLINE Take 1 tablet (25 mg total) by mouth 2 (two) times a day. Notes to patient: 6/19 pm   hydrocortisone 2.5 % cream Apply 1 application topically 2 (two) times daily as needed (rash).   isosorbide mononitrate 30 MG 24 hr tablet Commonly known as: IMDUR Take 1 tablet (30 mg total) by mouth daily. What changed:   medication strength  how much to take Notes to patient: 6/20 am   Lantus SoloStar 100 UNIT/ML Solostar Pen Generic drug: Insulin Glargine Inject 5 Units into the skin daily. Notes to patient: 6/20 am   multivitamin with minerals Tabs tablet Take 1 tablet by mouth daily. Notes to patient: 6/20 am   nicotine 14 mg/24hr patch Commonly known as: NICODERM CQ - dosed in mg/24 hours Place 1 patch (14 mg total) onto the skin daily. Notes to  patient: 6/20 am   oxyCODONE 5 MG immediate release tablet Commonly known as: Roxicodone Take 1 tablet (5 mg total) by mouth 2 (two) times daily as needed for severe pain.   pantoprazole 40 MG tablet Commonly known as: PROTONIX Take 1 tablet (40 mg total) by mouth daily. Notes to patient: 6/20 am   polyethylene glycol 17 g packet Commonly known as: MIRALAX / GLYCOLAX Take 17 g by mouth daily as needed.   thiamine 100 MG tablet Take 1 tablet (100 mg total) by mouth daily. Notes to patient: 6/20 am   torsemide 20 MG tablet Commonly known as: DEMADEX Take 1 tablet (20 mg total) by mouth daily. What changed: See the new instructions. Notes to patient: 6/20 am            Durable Medical Equipment  (From admission, onward)         Start     Ordered   06/08/19 1430  For home use only DME Kasandra Knudsen  Once     06/08/19 1429         Allergies  Allergen Reactions   Angiotensin Receptor Blockers Anaphylaxis and Other (See Comments)    (Angioedema also with Lisinopril, therefore ARB's are contraindicated)   Lisinopril Anaphylaxis and Other (See Comments)    Throat swells   Pamelor [Nortriptyline Hcl] Anaphylaxis and Swelling    Throat swells   Follow-up Information    Charlott Rakes, MD. Call in 1 day(s).   Specialty: Family Medicine Why: Please call for a post hospital follow-up appointment. Contact information: [place] [address] [phone]        [name], MD Follow up on 06/22/2019.   Specialty: Cardiology Why: '@9' :00am  Tele Visit Contact information: [place] [address] [phone]            The results of significant diagnostics from this hospitalization (including imaging, microbiology, ancillary and laboratory) are listed below for reference.    Significant Diagnostic Studies: Dg Chest 2 View  Result Date: 06/06/2019 CLINICAL DATA:  Chest pain EXAM: CHEST - 2 VIEW COMPARISON:  CT  dated 05/27/2019.  Chest x-ray dated 05/23/2019. FINDINGS: Again noted is cardiomegaly. There are small bilateral pleural effusions. There is evidence of developing pulmonary edema. There are few scattered airspace opacities bilaterally which are favored to be secondary to developing pulmonary edema. There is no pneumothorax. There is no acute osseous abnormality. IMPRESSION: Overall findings consistent with congestive heart failure with developing pulmonary edema including small bilateral pleural effusions. There are few scattered bilateral airspace opacities which are favored to represent atelectasis or sequela of pulmonary edema. An atypical infectious process can have a similar appearance in the appropriate clinical setting. Electronically Signed   By: Constance Holster M.D.   On: 06/06/2019 17:08   Ct Head Wo Contrast  Result Date: 06/06/2019 CLINICAL DATA:  Altered level of consciousness (LOC), unexplained; C-spine trauma, high clinical risk (NEXUS/CCR) EXAM: CT HEAD WITHOUT CONTRAST CT CERVICAL SPINE WITHOUT CONTRAST TECHNIQUE: Multidetector CT imaging of the head and cervical spine was performed following the standard protocol without intravenous contrast. Multiplanar CT image reconstructions of the cervical spine were also generated. COMPARISON:  Head and cervical spine CT 08/16/2018 FINDINGS: Mild motion artifact in the head and cervical spine. CT HEAD FINDINGS Brain: Similar degree of atrophy and chronic small vessel ischemia from prior. No intracranial hemorrhage, mass effect, or midline shift. No hydrocephalus. The basilar cisterns are patent. No evidence of territorial infarct or acute ischemia. No extra-axial or intracranial fluid collection. Vascular: Atherosclerosis of skullbase vasculature without hyperdense vessel or abnormal calcification. Skull: No fracture or focal lesion. Sinuses/Orbits: Paranasal sinuses and mastoid air cells are clear. The visualized orbits are unremarkable. Other:  None. CT CERVICAL SPINE FINDINGS Alignment: No traumatic subluxation. Reversal of normal cervical lordosis. Skull base and vertebrae: No acute fracture, detailed evaluation slightly limited by motion. Vertebral body heights are maintained. The dens and skull base are intact, skull base better assessed on concurrent head CT. Soft tissues and spinal canal: No prevertebral fluid or swelling. No visible canal hematoma. Disc levels: Multilevel disc space narrowing and endplate spurring most prominent at C5-C6. scattered facet arthropathy. Upper chest: Bilateral and pleural effusions and pulmonary edema, concordant with chest radiograph earlier this day. Other: Carotid atherosclerosis. IMPRESSION: 1. No acute intracranial abnormality. No skull fracture. Stable atrophy and chronic small vessel ischemia. 2. Multilevel degenerative change in the cervical spine without acute fracture or subluxation, allowing for mild  motion artifact. 3. Carotid and skullbase atherosclerosis. Electronically Signed   By: Keith Rake M.D.   On: 06/06/2019 19:13   Ct Chest Wo Contrast  Result Date: 05/27/2019 CLINICAL DATA:  Shortness of breath, CHF exacerbation, cirrhosis EXAM: CT CHEST WITHOUT CONTRAST TECHNIQUE: Multidetector CT imaging of the chest was performed following the standard protocol without IV contrast. COMPARISON:  08/16/2018 FINDINGS: Cardiovascular: Aortic atherosclerosis. Cardiomegaly. Three-vessel coronary artery calcifications. No pericardial effusion. Mediastinum/Nodes: No enlarged mediastinal, hilar, or axillary lymph nodes. Thyroid gland, trachea, and esophagus demonstrate no significant findings. Lungs/Pleura: Mild centrilobular emphysema. Small bilateral pleural effusions and associated atelectasis or consolidation. Mild interlobular septal thickening. Upper Abdomen: No acute abnormality.  Nodular contour of the liver. Musculoskeletal: No chest wall mass or suspicious bone lesions identified. IMPRESSION: 1. Small  bilateral pleural effusions and associated atelectasis or consolidation. Mild interlobular septal thickening. Findings are consistent with mild edema. 2.  Cardiomegaly and coronary artery disease. 3.  Mild emphysema. Electronically Signed   By: Eddie Candle M.D.   On: 05/27/2019 17:34   Ct Cervical Spine Wo Contrast  Result Date: 06/06/2019 CLINICAL DATA:  Altered level of consciousness (LOC), unexplained; C-spine trauma, high clinical risk (NEXUS/CCR) EXAM: CT HEAD WITHOUT CONTRAST CT CERVICAL SPINE WITHOUT CONTRAST TECHNIQUE: Multidetector CT imaging of the head and cervical spine was performed following the standard protocol without intravenous contrast. Multiplanar CT image reconstructions of the cervical spine were also generated. COMPARISON:  Head and cervical spine CT 08/16/2018 FINDINGS: Mild motion artifact in the head and cervical spine. CT HEAD FINDINGS Brain: Similar degree of atrophy and chronic small vessel ischemia from prior. No intracranial hemorrhage, mass effect, or midline shift. No hydrocephalus. The basilar cisterns are patent. No evidence of territorial infarct or acute ischemia. No extra-axial or intracranial fluid collection. Vascular: Atherosclerosis of skullbase vasculature without hyperdense vessel or abnormal calcification. Skull: No fracture or focal lesion. Sinuses/Orbits: Paranasal sinuses and mastoid air cells are clear. The visualized orbits are unremarkable. Other: None. CT CERVICAL SPINE FINDINGS Alignment: No traumatic subluxation. Reversal of normal cervical lordosis. Skull base and vertebrae: No acute fracture, detailed evaluation slightly limited by motion. Vertebral body heights are maintained. The dens and skull base are intact, skull base better assessed on concurrent head CT. Soft tissues and spinal canal: No prevertebral fluid or swelling. No visible canal hematoma. Disc levels: Multilevel disc space narrowing and endplate spurring most prominent at C5-C6. scattered  facet arthropathy. Upper chest: Bilateral and pleural effusions and pulmonary edema, concordant with chest radiograph earlier this day. Other: Carotid atherosclerosis. IMPRESSION: 1. No acute intracranial abnormality. No skull fracture. Stable atrophy and chronic small vessel ischemia. 2. Multilevel degenerative change in the cervical spine without acute fracture or subluxation, allowing for mild motion artifact. 3. Carotid and skullbase atherosclerosis. Electronically Signed   By: Keith Rake M.D.   On: 06/06/2019 19:13   Dg Chest Port 1 View  Result Date: 05/23/2019 CLINICAL DATA:  Shortness of breath EXAM: PORTABLE CHEST 1 VIEW COMPARISON:  04/16/2019 FINDINGS: Cardiomegaly. Pulmonary venous hypertension with early interstitial edema. No infiltrate, collapse or effusion. No acute bone finding. IMPRESSION: Congestive heart failure.  Cardiomegaly and interstitial edema. Electronically Signed   By: Nelson Chimes M.D.   On: 05/23/2019 16:46    Microbiology: Recent Results (from the past 240 hour(s))  SARS Coronavirus 2     Status: None   Collection Time: 06/06/19  7:55 PM  Result Value Ref Range Status   SARS Coronavirus 2 NOT DETECTED NOT DETECTED Final  Comment: (NOTE) SARS-CoV-2 target nucleic acids are NOT DETECTED. The SARS-CoV-2 RNA is generally detectable in upper and lower respiratory specimens during the acute phase of infection.  Negative  results do not preclude SARS-CoV-2 infection, do not rule out co-infections with other pathogens, and should not be used as the sole basis for treatment or other patient management decisions.  Negative results must be combined with clinical observations, patient history, and epidemiological information. The expected result is Not Detected. Fact Sheet for Patients: http://www.biofiredefense.com/wp-content/uploads/2020/03/BIOFIRE-COVID -19-patients.pdf Fact Sheet for Healthcare  Providers: http://www.biofiredefense.com/wp-content/uploads/2020/03/BIOFIRE-COVID -19-hcp.pdf This test is not yet approved or cleared by the [place] and  has been authorized for detection and/or diagnosis of SARS-CoV-2 by FDA under an Emergency Use Authorization (EUA).  This EUA will remain in effec t (meaning this test can be used) for the duration of  the COVID-19 declaration under Section 564(b)(1) of the Act, 21 U.S.C. section 360bbb-3(b)(1), unless the authorization is terminated or revoked sooner. Performed at [facility], [place] [address], [place], [place] [address]   Urine culture     Status: None   Collection Time: 06/06/19 11:27 PM   Specimen: Urine, Random  Result Value Ref Range Status   Specimen Description URINE, RANDOM  Final   Special Requests NONE  Final   Culture   Final    NO GROWTH Performed at [facility], [place] [address], [place], [place] [address]    Report Status 06/08/2019 FINAL  Final     Labs: Basic Metabolic Panel: Recent Labs  Lab 06/06/19 1644 06/06/19 1823 06/07/19 0536 06/08/19 0848 06/09/19 0059 06/10/19 0602  NA 127* 130* 127* 130* 130* 132*  K 4.5 4.3 3.9 3.7 4.3 4.6  CL 97*  --  98 98 99 101  CO2 20*  --  17* 23 20* 24  GLUCOSE 147*  --  169* 134* 124* 136*  BUN 18  --  19 22* 25* 27*  CREATININE 1.56*  --  1.67* 1.87* 1.90* 1.74*  CALCIUM 9.0  --  8.8* 8.8* 8.6* 8.8*  MG  --   --   --  1.9 2.0 2.2   Liver Function Tests: Recent Labs  Lab 06/06/19 1734  AST 21  ALT 20  ALKPHOS 77  BILITOT 1.0  PROT 8.0  ALBUMIN 3.3*   No results for input(s): LIPASE, AMYLASE in the last 168 hours. Recent Labs  Lab 06/06/19 1734  AMMONIA 47*   CBC: Recent Labs  Lab 06/06/19 1644 06/06/19 1823 06/07/19 0536 06/08/19 0848 06/09/19 0059 06/10/19 0602  WBC 5.4  --  4.6 4.6 5.3 4.7  HGB 11.2* 11.9* 10.6* 10.8* 10.9* 10.6*  HCT 33.7* 35.0* 30.9* 32.6* 32.5* 31.5*  MCV 92.1  --  89.8 92.1 92.9 92.9  PLT 181   --  164 175 186 171   Cardiac Enzymes: Recent Labs  Lab 06/06/19 2310 06/07/19 0536 06/07/19 1135 06/08/19 1626 06/09/19 0059  TROPONINI <0.03 <0.03 0.03* 0.03* 0.03*   BNP: BNP (last 3 results) Recent Labs    04/16/19 1309 05/23/19 1636 06/06/19 1752  BNP 973.4* 3,418.4* 1,314.8*    ProBNP (last 3 results) No results for input(s): PROBNP in the last 8760 hours.  CBG: Recent Labs  Lab 06/09/19 1213 06/09/19 1617 06/09/19 2051 06/10/19 0638 06/10/19 1156  GLUCAP 155* 108* 119* 140* 132*       Signed:  Kayleen Memos, MD [facility] 06/10/2019, 2:38 PM

## 2019-06-10 NOTE — Progress Notes (Signed)
Discharge packet with medication education given with teach back. Questions and concerns were answered. Patient denies chest pain or shortness of breath. Patient's belongings with patient : glasses, cell phone, charger, medications from [facility] delivered and with patient. Cane delivered in pt room. patient states he is very grateful for the care given per pt statement. Peripheral iv clean dry and removed. Transported in a wheelchair by Chartered certified accountant.

## 2019-06-10 NOTE — TOC Progression Note (Signed)
Transition of Care [facility]) - Progression Note    Patient Details  Name: Brad [name] MRN: [id number] Date of Birth: 07-17-1961  [facility] CM/SW Contact  Eileen Stanford, LCSW Phone Number: 06/10/2019, 10:56 AM  Clinical Narrative:  CSW provided shelter list and [facility] packet. Pt does not need bus pass, buses are free at this time. Pt aware.     Expected Discharge Plan: Home/Self Care Barriers to Discharge: No Barriers Identified  Expected Discharge Plan and Services Expected Discharge Plan: Home/Self Care In-house Referral: NA Discharge Planning Services: CM Consult Post Acute Care Choice: Durable Medical Equipment Living arrangements for the past 2 months: Apartment Expected Discharge Date: 06/10/19               DME Arranged: Kasandra Knudsen DME Agency: [employer] Date DME Agency Contacted: 06/10/19 Time DME Agency Contacted: 37 Representative spoke with at DME Agency: [place]: NA           Social Determinants of Health ([place]) Interventions    Readmission Risk Interventions Readmission Risk Prevention Plan 05/28/2019  Transportation Screening Complete  Medication Review Press photographer) Complete  PCP or Specialist appointment within 3-5 days of discharge Complete  HRI or [place] Complete  SW Recovery Care/Counseling Consult Complete  [place] Not Applicable  Some recent data might be hidden

## 2019-06-10 NOTE — TOC Transition Note (Signed)
Transition of Care [facility]) - CM/SW Discharge Note   Patient Details  Name: Brad [name] MRN: [id number] Date of Birth: 1961-10-28  Transition of Care [facility]) CM/SW Contact:  Bethena Roys, RN Phone Number: 06/10/2019, 10:29 AM   Clinical Narrative:   Pt presented for acute on chronic systolic CHF- No PT needs at this time. Patient was provided a cane prior to transition home. No further needs from CM at this time.    Final next level of care: Home/Self Care Barriers to Discharge: No Barriers Identified    Discharge Plan and Services In-house Referral: NA Discharge Planning Services: CM Consult Post Acute Care Choice: Durable Medical Equipment          DME Arranged: Kasandra Knudsen DME Agency: [employer] Date DME Agency Contacted: 06/10/19 Time DME Agency Contacted: 66 Representative spoke with at DME Agency: [place]: NA          Social Determinants of Health ([place]) Interventions     Readmission Risk Interventions Readmission Risk Prevention Plan 05/28/2019  Transportation Screening Complete  Medication Review Press photographer) Complete  PCP or Specialist appointment within 3-5 days of discharge Complete  HRI or [place] Complete  SW Recovery Care/Counseling Consult Complete  [place] Not Applicable  Some recent data might be hidden

## 2019-06-10 NOTE — Telephone Encounter (Signed)
Spoke to [place] who seemed in good spirits post discharge from [facility].  Burhan states friend Lorriane Shire kicked him out of her [employer]. Tavon stated she was trying to intervene with his personal relationship with his son. Ebony reports that he is going to stay back with Rodena Piety. Renji reports having his medications after being discharged from [facility]. Linkon admitted to relapsing on Cocaine prior to Hospital Admission. Shontez agreed to home visit on Monday.  Time to be determined. Call completed.

## 2019-06-12 ENCOUNTER — Other Ambulatory Visit: Payer: Self-pay

## 2019-06-12 ENCOUNTER — Emergency Department (HOSPITAL_COMMUNITY): Payer: Medicaid Other

## 2019-06-12 ENCOUNTER — Encounter (HOSPITAL_COMMUNITY): Payer: Self-pay | Admitting: Emergency Medicine

## 2019-06-12 ENCOUNTER — Observation Stay (HOSPITAL_COMMUNITY)
Admission: EM | Admit: 2019-06-12 | Discharge: 2019-06-13 | Disposition: A | Discharge status: Home / Self Care | Payer: Medicaid Other | Attending: Internal Medicine | Admitting: Internal Medicine

## 2019-06-12 DIAGNOSIS — I2510 Atherosclerotic heart disease of native coronary artery without angina pectoris: Secondary | ICD-10-CM | POA: Insufficient documentation

## 2019-06-12 DIAGNOSIS — F17210 Nicotine dependence, cigarettes, uncomplicated: Secondary | ICD-10-CM | POA: Diagnosis not present

## 2019-06-12 DIAGNOSIS — N183 Chronic kidney disease, stage 3 (moderate): Secondary | ICD-10-CM | POA: Diagnosis not present

## 2019-06-12 DIAGNOSIS — Z794 Long term (current) use of insulin: Secondary | ICD-10-CM | POA: Insufficient documentation

## 2019-06-12 DIAGNOSIS — Z1159 Encounter for screening for other viral diseases: Secondary | ICD-10-CM | POA: Diagnosis not present

## 2019-06-12 DIAGNOSIS — M1990 Unspecified osteoarthritis, unspecified site: Secondary | ICD-10-CM | POA: Diagnosis not present

## 2019-06-12 DIAGNOSIS — F1410 Cocaine abuse, uncomplicated: Secondary | ICD-10-CM | POA: Diagnosis not present

## 2019-06-12 DIAGNOSIS — I6529 Occlusion and stenosis of unspecified carotid artery: Secondary | ICD-10-CM | POA: Insufficient documentation

## 2019-06-12 DIAGNOSIS — E119 Type 2 diabetes mellitus without complications: Secondary | ICD-10-CM | POA: Diagnosis present

## 2019-06-12 DIAGNOSIS — K7460 Unspecified cirrhosis of liver: Secondary | ICD-10-CM | POA: Diagnosis not present

## 2019-06-12 DIAGNOSIS — I5021 Acute systolic (congestive) heart failure: Secondary | ICD-10-CM | POA: Diagnosis not present

## 2019-06-12 DIAGNOSIS — I340 Nonrheumatic mitral (valve) insufficiency: Secondary | ICD-10-CM | POA: Diagnosis not present

## 2019-06-12 DIAGNOSIS — J439 Emphysema, unspecified: Secondary | ICD-10-CM | POA: Diagnosis not present

## 2019-06-12 DIAGNOSIS — F1910 Other psychoactive substance abuse, uncomplicated: Secondary | ICD-10-CM | POA: Diagnosis not present

## 2019-06-12 DIAGNOSIS — I4892 Unspecified atrial flutter: Secondary | ICD-10-CM | POA: Diagnosis not present

## 2019-06-12 DIAGNOSIS — E1122 Type 2 diabetes mellitus with diabetic chronic kidney disease: Secondary | ICD-10-CM | POA: Diagnosis not present

## 2019-06-12 DIAGNOSIS — K219 Gastro-esophageal reflux disease without esophagitis: Secondary | ICD-10-CM | POA: Insufficient documentation

## 2019-06-12 DIAGNOSIS — B182 Chronic viral hepatitis C: Secondary | ICD-10-CM | POA: Diagnosis not present

## 2019-06-12 DIAGNOSIS — Z79899 Other long term (current) drug therapy: Secondary | ICD-10-CM | POA: Diagnosis not present

## 2019-06-12 DIAGNOSIS — E7800 Pure hypercholesterolemia, unspecified: Secondary | ICD-10-CM | POA: Insufficient documentation

## 2019-06-12 DIAGNOSIS — I5043 Acute on chronic combined systolic (congestive) and diastolic (congestive) heart failure: Secondary | ICD-10-CM | POA: Insufficient documentation

## 2019-06-12 DIAGNOSIS — I130 Hypertensive heart and chronic kidney disease with heart failure and stage 1 through stage 4 chronic kidney disease, or unspecified chronic kidney disease: Secondary | ICD-10-CM | POA: Diagnosis not present

## 2019-06-12 DIAGNOSIS — Z7901 Long term (current) use of anticoagulants: Secondary | ICD-10-CM | POA: Diagnosis not present

## 2019-06-12 DIAGNOSIS — Z720 Tobacco use: Secondary | ICD-10-CM | POA: Diagnosis present

## 2019-06-12 DIAGNOSIS — F329 Major depressive disorder, single episode, unspecified: Secondary | ICD-10-CM | POA: Diagnosis not present

## 2019-06-12 DIAGNOSIS — G629 Polyneuropathy, unspecified: Secondary | ICD-10-CM | POA: Insufficient documentation

## 2019-06-12 DIAGNOSIS — I509 Heart failure, unspecified: Secondary | ICD-10-CM | POA: Diagnosis present

## 2019-06-12 DIAGNOSIS — R0789 Other chest pain: Secondary | ICD-10-CM

## 2019-06-12 DIAGNOSIS — E118 Type 2 diabetes mellitus with unspecified complications: Secondary | ICD-10-CM

## 2019-06-12 DIAGNOSIS — J90 Pleural effusion, not elsewhere classified: Secondary | ICD-10-CM | POA: Diagnosis not present

## 2019-06-12 DIAGNOSIS — I10 Essential (primary) hypertension: Secondary | ICD-10-CM | POA: Diagnosis present

## 2019-06-12 DIAGNOSIS — I440 Atrioventricular block, first degree: Secondary | ICD-10-CM | POA: Insufficient documentation

## 2019-06-12 DIAGNOSIS — I5023 Acute on chronic systolic (congestive) heart failure: Secondary | ICD-10-CM | POA: Diagnosis present

## 2019-06-12 LAB — BASIC METABOLIC PANEL
Anion gap: 12 (ref 5–15)
BUN: 19 mg/dL (ref 6–20)
CO2: 20 mmol/L — ABNORMAL LOW (ref 22–32)
Calcium: 8.6 mg/dL — ABNORMAL LOW (ref 8.9–10.3)
Chloride: 99 mmol/L (ref 98–111)
Creatinine, Ser: 1.3 mg/dL — ABNORMAL HIGH (ref 0.61–1.24)
GFR calc Af Amer: >60 mL/min (ref >60)
GFR calc non Af Amer: 60 mL/min — ABNORMAL LOW (ref >60)
Glucose, Bld: 134 mg/dL — ABNORMAL HIGH (ref 70–99)
Potassium: 3.7 mmol/L (ref 3.5–5.1)
Sodium: 131 mmol/L — ABNORMAL LOW (ref 135–145)

## 2019-06-12 LAB — CBC WITH DIFFERENTIAL/PLATELET
Abs Immature Granulocytes: 0.01 10*3/uL (ref 0.00–0.07)
Basophils Absolute: 0 10*3/uL (ref 0.0–0.1)
Basophils Relative: 1 %
Eosinophils Absolute: 0.1 10*3/uL (ref 0.0–0.5)
Eosinophils Relative: 3 %
HCT: 31.6 % — ABNORMAL LOW (ref 39.0–52.0)
Hemoglobin: 10.7 g/dL — ABNORMAL LOW (ref 13.0–17.0)
Immature Granulocytes: 0 %
Lymphocytes Relative: 20 %
Lymphs Abs: 0.8 10*3/uL (ref 0.7–4.0)
MCH: 31 pg (ref 26.0–34.0)
MCHC: 33.9 g/dL (ref 30.0–36.0)
MCV: 91.6 fL (ref 80.0–100.0)
Monocytes Absolute: 0.5 10*3/uL (ref 0.1–1.0)
Monocytes Relative: 14 %
Neutro Abs: 2.4 10*3/uL (ref 1.7–7.7)
Neutrophils Relative %: 62 %
Platelets: 212 10*3/uL (ref 150–400)
RBC: 3.45 MIL/uL — ABNORMAL LOW (ref 4.22–5.81)
RDW: 17 % — ABNORMAL HIGH (ref 11.5–15.5)
WBC: 3.9 10*3/uL — ABNORMAL LOW (ref 4.0–10.5)
nRBC: 0 % (ref 0.0–0.2)

## 2019-06-12 LAB — HEPATIC FUNCTION PANEL
ALT: 20 U/L (ref 0–44)
AST: 22 U/L (ref 15–41)
Albumin: 3.1 g/dL — ABNORMAL LOW (ref 3.5–5.0)
Alkaline Phosphatase: 64 U/L (ref 38–126)
Bilirubin, Direct: 0.2 mg/dL (ref 0.0–0.2)
Indirect Bilirubin: 0.4 mg/dL (ref 0.3–0.9)
Total Bilirubin: 0.6 mg/dL (ref 0.3–1.2)
Total Protein: 7.5 g/dL (ref 6.5–8.1)

## 2019-06-12 LAB — RAPID URINE DRUG SCREEN, HOSP PERFORMED
Amphetamines: NOT DETECTED
Barbiturates: NOT DETECTED
Benzodiazepines: NOT DETECTED
Cocaine: POSITIVE — AB
Opiates: NOT DETECTED
Tetrahydrocannabinol: NOT DETECTED

## 2019-06-12 LAB — GLUCOSE, CAPILLARY: Glucose-Capillary: 182 mg/dL — ABNORMAL HIGH (ref 70–99)

## 2019-06-12 LAB — SARS CORONAVIRUS 2 BY RT PCR (HOSPITAL ORDER, PERFORMED IN ~~LOC~~ HOSPITAL LAB): SARS Coronavirus 2: NEGATIVE

## 2019-06-12 LAB — BRAIN NATRIURETIC PEPTIDE: B Natriuretic Peptide: 1280.2 pg/mL — ABNORMAL HIGH (ref 0.0–100.0)

## 2019-06-12 LAB — TROPONIN I
Troponin I: 0.04 ng/mL (ref <0.03)
Troponin I: 0.04 ng/mL (ref <0.03)

## 2019-06-12 MED ORDER — ADULT MULTIVITAMIN W/MINERALS CH
1.0000 | ORAL_TABLET | Freq: Every day | ORAL | Status: DC
  Administered 2019-06-12 – 2019-06-13 (×2): 1 via ORAL
  Filled 2019-06-12 (×2): qty 1

## 2019-06-12 MED ORDER — PANTOPRAZOLE SODIUM 40 MG PO TBEC
40.0000 mg | DELAYED_RELEASE_TABLET | Freq: Every day | ORAL | Status: DC
  Administered 2019-06-12 – 2019-06-13 (×2): 40 mg via ORAL
  Filled 2019-06-12 (×2): qty 1

## 2019-06-12 MED ORDER — ATORVASTATIN CALCIUM 40 MG PO TABS
40.0000 mg | ORAL_TABLET | Freq: Every day | ORAL | Status: DC
  Administered 2019-06-13: 40 mg via ORAL
  Filled 2019-06-12: qty 1

## 2019-06-12 MED ORDER — SPIRONOLACTONE 25 MG PO TABS
25.0000 mg | ORAL_TABLET | Freq: Every day | ORAL | Status: DC
  Administered 2019-06-12 – 2019-06-13 (×2): 25 mg via ORAL
  Filled 2019-06-12 (×2): qty 1

## 2019-06-12 MED ORDER — INSULIN ASPART 100 UNIT/ML ~~LOC~~ SOLN: 0.0000 [IU] | Freq: Every day | SUBCUTANEOUS | Status: DC

## 2019-06-12 MED ORDER — INSULIN GLARGINE 100 UNIT/ML SOLOSTAR PEN: 5.0000 [IU] | PEN_INJECTOR | Freq: Every day | SUBCUTANEOUS | Status: DC

## 2019-06-12 MED ORDER — FOLIC ACID 1 MG PO TABS
1.0000 mg | ORAL_TABLET | Freq: Every day | ORAL | Status: DC
  Administered 2019-06-12 – 2019-06-13 (×2): 1 mg via ORAL
  Filled 2019-06-12 (×2): qty 1

## 2019-06-12 MED ORDER — LORAZEPAM 1 MG PO TABS
1.0000 mg | ORAL_TABLET | Freq: Once | ORAL | Status: AC
  Administered 2019-06-12: 1 mg via ORAL
  Filled 2019-06-12 (×2): qty 1

## 2019-06-12 MED ORDER — SODIUM CHLORIDE 0.9% FLUSH: 3.0000 mL | INTRAVENOUS | Status: DC | PRN

## 2019-06-12 MED ORDER — APIXABAN 5 MG PO TABS
5.0000 mg | ORAL_TABLET | Freq: Two times a day (BID) | ORAL | Status: DC
  Administered 2019-06-12 – 2019-06-13 (×2): 5 mg via ORAL
  Filled 2019-06-12 (×2): qty 1

## 2019-06-12 MED ORDER — LORATADINE 10 MG PO TABS
10.0000 mg | ORAL_TABLET | Freq: Every day | ORAL | Status: DC
  Administered 2019-06-12 – 2019-06-13 (×2): 10 mg via ORAL
  Filled 2019-06-12 (×2): qty 1

## 2019-06-12 MED ORDER — FERROUS SULFATE 325 (65 FE) MG PO TABS
325.0000 mg | ORAL_TABLET | Freq: Every day | ORAL | Status: DC
  Administered 2019-06-13: 325 mg via ORAL
  Filled 2019-06-12: qty 1

## 2019-06-12 MED ORDER — OXYCODONE HCL 5 MG PO TABS
5.0000 mg | ORAL_TABLET | Freq: Two times a day (BID) | ORAL | Status: DC | PRN
  Administered 2019-06-12: 23:00:00 5 mg via ORAL
  Filled 2019-06-12: qty 1

## 2019-06-12 MED ORDER — INSULIN GLARGINE 100 UNIT/ML ~~LOC~~ SOLN
5.0000 [IU] | Freq: Every day | SUBCUTANEOUS | Status: DC
  Administered 2019-06-13: 5 [IU] via SUBCUTANEOUS
  Filled 2019-06-12: qty 0.05

## 2019-06-12 MED ORDER — ISOSORBIDE MONONITRATE ER 30 MG PO TB24
30.0000 mg | ORAL_TABLET | Freq: Every day | ORAL | Status: DC
  Administered 2019-06-12 – 2019-06-13 (×2): 30 mg via ORAL
  Filled 2019-06-12 (×2): qty 1

## 2019-06-12 MED ORDER — ONDANSETRON HCL 4 MG/2ML IJ SOLN: 4.0000 mg | Freq: Four times a day (QID) | INTRAMUSCULAR | Status: DC | PRN

## 2019-06-12 MED ORDER — SODIUM CHLORIDE 0.9 % IV SOLN: 250.0000 mL | INTRAVENOUS | Status: DC | PRN

## 2019-06-12 MED ORDER — DILTIAZEM HCL 30 MG PO TABS
30.0000 mg | ORAL_TABLET | Freq: Two times a day (BID) | ORAL | Status: DC
  Administered 2019-06-12 – 2019-06-13 (×2): 30 mg via ORAL
  Filled 2019-06-12 (×2): qty 1

## 2019-06-12 MED ORDER — FUROSEMIDE 10 MG/ML IJ SOLN
40.0000 mg | Freq: Once | INTRAMUSCULAR | Status: AC
  Administered 2019-06-12: 40 mg via INTRAVENOUS
  Filled 2019-06-12: qty 4

## 2019-06-12 MED ORDER — VITAMIN B-1 100 MG PO TABS
100.0000 mg | ORAL_TABLET | Freq: Every day | ORAL | Status: DC
  Administered 2019-06-12 – 2019-06-13 (×2): 100 mg via ORAL
  Filled 2019-06-12 (×2): qty 1

## 2019-06-12 MED ORDER — ONDANSETRON HCL 4 MG PO TABS: 4.0000 mg | ORAL_TABLET | Freq: Four times a day (QID) | ORAL | Status: DC | PRN

## 2019-06-12 MED ORDER — FUROSEMIDE 10 MG/ML IJ SOLN
40.0000 mg | Freq: Two times a day (BID) | INTRAMUSCULAR | Status: DC
  Administered 2019-06-12 – 2019-06-13 (×2): 40 mg via INTRAVENOUS
  Filled 2019-06-12 (×2): qty 4

## 2019-06-12 MED ORDER — INSULIN ASPART 100 UNIT/ML ~~LOC~~ SOLN
0.0000 [IU] | Freq: Three times a day (TID) | SUBCUTANEOUS | Status: DC
  Administered 2019-06-13: 2 [IU] via SUBCUTANEOUS

## 2019-06-12 MED ORDER — NICOTINE 14 MG/24HR TD PT24: 14.0000 mg | MEDICATED_PATCH | Freq: Every day | TRANSDERMAL | Status: DC

## 2019-06-12 MED ORDER — SODIUM CHLORIDE 0.9% FLUSH
3.0000 mL | Freq: Two times a day (BID) | INTRAVENOUS | Status: DC
  Administered 2019-06-12 – 2019-06-13 (×2): 3 mL via INTRAVENOUS

## 2019-06-12 NOTE — ED Triage Notes (Signed)
Pt. Stated, Im having SOB and CP started this morning.

## 2019-06-12 NOTE — Progress Notes (Signed)
Pt having chest pain,says he doesn't want nitro. Requested the PRN oxy IR. RN also placed O2 and helped pt reposition.
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
## 2019-06-12 NOTE — ED Notes (Signed)
Attempted to give report to 3E17.
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
## 2019-06-12 NOTE — ED Triage Notes (Signed)
Per [facility], Pt woke up with CP and SHOB. Pt admitted within the last week. Pt has hx of CHF. Pt has distended abdomen, clear lungs. Pt's O2 98-99% on room air.  CP- cramping, tender on palpation Pt received 4 nitro and 324 ASA en route. Pt recently changed from lasix to torsemide, reports medication compliance. Pt reports recent kidney injury.

## 2019-06-13 ENCOUNTER — Telehealth (HOSPITAL_COMMUNITY): Payer: Self-pay

## 2019-06-13 ENCOUNTER — Encounter (HOSPITAL_COMMUNITY): Payer: Medicaid Other

## 2019-06-13 DIAGNOSIS — I5023 Acute on chronic systolic (congestive) heart failure: Secondary | ICD-10-CM | POA: Diagnosis not present

## 2019-06-13 LAB — BASIC METABOLIC PANEL
Anion gap: 10 (ref 5–15)
BUN: 25 mg/dL — ABNORMAL HIGH (ref 6–20)
CO2: 25 mmol/L (ref 22–32)
Calcium: 8.7 mg/dL — ABNORMAL LOW (ref 8.9–10.3)
Chloride: 100 mmol/L (ref 98–111)
Creatinine, Ser: 1.71 mg/dL — ABNORMAL HIGH (ref 0.61–1.24)
GFR calc Af Amer: 50 mL/min — ABNORMAL LOW (ref >60)
GFR calc non Af Amer: 43 mL/min — ABNORMAL LOW (ref >60)
Glucose, Bld: 140 mg/dL — ABNORMAL HIGH (ref 70–99)
Potassium: 3.4 mmol/L — ABNORMAL LOW (ref 3.5–5.1)
Sodium: 135 mmol/L (ref 135–145)

## 2019-06-13 LAB — TROPONIN I
Troponin I: 0.04 ng/mL (ref <0.03)
Troponin I: 0.04 ng/mL (ref <0.03)

## 2019-06-13 LAB — GLUCOSE, CAPILLARY
Glucose-Capillary: 153 mg/dL — ABNORMAL HIGH (ref 70–99)
Glucose-Capillary: 127 mg/dL — ABNORMAL HIGH (ref 70–99)

## 2019-06-13 MED ORDER — TORSEMIDE 20 MG PO TABS: 60.0000 mg | ORAL_TABLET | Freq: Two times a day (BID) | ORAL | 1 refills | Status: DC

## 2019-06-13 MED ORDER — SPIRONOLACTONE 25 MG PO TABS: 25.0000 mg | ORAL_TABLET | Freq: Every day | ORAL | 1 refills | Status: DC

## 2019-06-13 NOTE — Telephone Encounter (Signed)
Called [place] multiple times today with no return of call.

## 2019-06-13 NOTE — Plan of Care (Signed)
  Problem: Education: Goal: Knowledge of General Education information will improve Description Including pain rating scale, medication(s)/side effects and non-pharmacologic comfort measures Outcome: Progressing   Problem: Health Behavior/Discharge Planning: Goal: Ability to manage health-related needs will improve Outcome: Progressing   

## 2019-06-13 NOTE — Discharge Summary (Addendum)
Physician Discharge Summary  Brad [name] GYB:[id number] DOB: [birth year] DOA: 06/12/2019  PCP: Charlott Rakes, MD  Admit date: 06/12/2019 Discharge date: 06/13/2019  Time spent: 45 minutes  Recommendations for Outpatient Follow-up:  1. Follow up closely with CHF clinic-- diuretic: torsemide 60 mg BID   Discharge Diagnoses:  Principal Problem:   Acute on chronic systolic congestive heart failure (HCC) Active Problems:   Polysubstance abuse ([place])   Essential hypertension   Type 2 diabetes mellitus with complication, without long-term current use of insulin (HCC)   Severe mitral regurgitation   Tobacco abuse   Discharge Condition: stable  Diet recommendation: heart healthy carb modified  Filed Weights   06/12/19 1544 06/12/19 1900 06/13/19 0251  Weight: 79.4 kg 79 kg 78.5 kg    History of present illness:  Brad [name]  is a 58 y.o. male, with past medical history significant for paroxysmal A. fib/flutter combined systolic, diastolic congestive heart failure, diabetes mellitus type 2, hep C, cirrhosis and substance abuse in addition to multiple other problems presented 6/21 with gradual onset of shortness of breath worsening.  Patient was started on torsemide by cardiology recently.  He complained substernal chest discomfort radiating to his left side.  No fever chills nausea vomiting or diarrhea.  No new cough the patient was discharged from our facility 2 days prior after an admission for congestive heart failure exacerbation and chest pain as well. Patient continued his cocaine habit at home. In the emergency room his chest x-ray and BNP are consistent with congestive heart failure.  His creatinine is still elevated but improved from previous admissions.  Hospital Course:  #1. Acute on chronic systolic heart failure. Resolved this am. Provided with IV lasix. Last echo EF 30% severe mitral regurg.  Chart review indicates several admissions this month. Patient continues with  cocaine use. Will resume home torsemide 65m BID. Of note, curbside with dr MAundra Dubinto confirm torsemide dose-- patient needs close follow up  #2. Chest pain. Chronic. Troponin slightly elevated but flat. ekg without acute changes. Pain free at discharge. Continue home med  #3. Hypertension. BP high end of normal. Home meds include torsemide, aldactone, diltiazem, hydraloazine, imdur. Continue home med  #4. Diabetes.  Uncontrolled. A1c 7.3 2 weeks ago.   #5. Substance abuse. UDS +cocaine. Counseled to stop    Discharge Exam: Vitals:   06/13/19 0905 06/13/19 0913  BP: (!) 122/100 (!) 144/97  Pulse: 91 91  Resp:    Temp:    SpO2:      General: well nourished eating breakfast no acute distress Cardiovascular: rrr no mgr no LE edema Respiratory: normal effort BS clear only slightly distant. No crackles  Discharge Instructions   Discharge Instructions    Call MD for:  difficulty breathing, headache or visual disturbances   Complete by: As directed    Call MD for:  extreme fatigue   Complete by: As directed    Call MD for:  persistant dizziness or light-headedness   Complete by: As directed    Diet - low sodium heart healthy   Complete by: As directed    Diet Carb Modified   Complete by: As directed    Discharge instructions   Complete by: As directed    Take medications as prescribed Follow up with cardiology 1-2 weeks for evaluation of symptoms and volume status   Heart Failure patients record your daily weight using the same scale at the same time of day   Complete by: As directed  Increase activity slowly   Complete by: As directed      Allergies as of 06/13/2019      Reactions   Angiotensin Receptor Blockers Anaphylaxis, Other (See Comments)   (Angioedema also with Lisinopril, therefore ARB's are contraindicated)   Lisinopril Anaphylaxis, Other (See Comments)   Throat swells   Pamelor [nortriptyline Hcl] Anaphylaxis, Swelling   Throat swells      Medication  List    TAKE these medications   Accu-Chek FastClix Lancets Misc Use as directed three times daily   Accu-Chek Guide w/Device Kit 1 each by Does not apply route 3 (three) times daily.   apixaban 5 MG Tabs tablet Commonly known as: ELIQUIS Take 1 tablet (5 mg total) by mouth 2 (two) times daily. Notes to patient: 6/22   atorvastatin 40 MG tablet Commonly known as: LIPITOR Take 1 tablet (40 mg total) by mouth daily. What changed: when to take this Notes to patient: 6/23   cetirizine 10 MG tablet Commonly known as: ZYRTEC TAKE 1 TABLET (10 MG TOTAL) BY MOUTH DAILY.   diltiazem 30 MG tablet Commonly known as: CARDIZEM Take 1 tablet (30 mg total) by mouth 2 (two) times daily. Notes to patient: 6/22   Easy Comfort Pen Needles 31G X 8 MM Misc Generic drug: Insulin Pen Needle USE AS DIRECTED TWICE A DAY   ferrous sulfate 325 (65 FE) MG tablet Take 1 tablet (325 mg total) by mouth daily with breakfast. Notes to patient: 2/84   folic acid 1 MG tablet Commonly known as: FOLVITE Take 1 tablet (1 mg total) by mouth daily. Notes to patient: 6/23   gabapentin 300 MG capsule Commonly known as: NEURONTIN TAKE 2 CAPSULES (600 MG TOTAL) BY MOUTH 2 (TWO) TIMES DAILY.   glucose blood test strip Commonly known as: Accu-Chek Guide Use as directed three times daily   hydrALAZINE 25 MG tablet Commonly known as: APRESOLINE Take 1 tablet (25 mg total) by mouth 2 (two) times a day.   hydrocortisone 2.5 % cream Apply 1 application topically 2 (two) times daily as needed (rash).   isosorbide mononitrate 30 MG 24 hr tablet Commonly known as: IMDUR Take 1 tablet (30 mg total) by mouth daily. Notes to patient: 6/23   Lantus SoloStar 100 UNIT/ML Solostar Pen Generic drug: Insulin Glargine Inject 5 Units into the skin daily. Notes to patient: 6/23   multivitamin with minerals Tabs tablet Take 1 tablet by mouth daily. Notes to patient: 6/23   nicotine 14 mg/24hr patch Commonly  known as: NICODERM CQ - dosed in mg/24 hours Place 1 patch (14 mg total) onto the skin daily.   oxyCODONE 5 MG immediate release tablet Commonly known as: Roxicodone Take 1 tablet (5 mg total) by mouth 2 (two) times daily as needed for severe pain.   pantoprazole 40 MG tablet Commonly known as: PROTONIX Take 1 tablet (40 mg total) by mouth daily. Notes to patient: 6/23   polyethylene glycol 17 g packet Commonly known as: MIRALAX / GLYCOLAX Take 17 g by mouth daily as needed. What changed: reasons to take this   thiamine 100 MG tablet Take 1 tablet (100 mg total) by mouth daily. Notes to patient: 6/23   torsemide 20 MG tablet Commonly known as: DEMADEX Take 3 tablets (60 mg total) by mouth 2 (two) times daily. What changed:   how much to take  when to take this      Allergies  Allergen Reactions  . Angiotensin Receptor Blockers Anaphylaxis and  Other (See Comments)    (Angioedema also with Lisinopril, therefore ARB's are contraindicated)  . Lisinopril Anaphylaxis and Other (See Comments)    Throat swells  . Pamelor [Nortriptyline Hcl] Anaphylaxis and Swelling    Throat swells   Follow-up Information    [facility] Go on 06/21/2019.   Why: '@2' :30pm Contact information: [address] [place] [address] [phone]           The results of significant diagnostics from this hospitalization (including imaging, microbiology, ancillary and laboratory) are listed below for reference.    Significant Diagnostic Studies: Dg Chest 2 View  Result Date: 06/06/2019 CLINICAL DATA:  Chest pain EXAM: CHEST - 2 VIEW COMPARISON:  CT dated 05/27/2019.  Chest x-ray dated 05/23/2019. FINDINGS: Again noted is cardiomegaly. There are small bilateral pleural effusions. There is evidence of developing pulmonary edema. There are few scattered airspace opacities bilaterally which are favored to be secondary to developing pulmonary  edema. There is no pneumothorax. There is no acute osseous abnormality. IMPRESSION: Overall findings consistent with congestive heart failure with developing pulmonary edema including small bilateral pleural effusions. There are few scattered bilateral airspace opacities which are favored to represent atelectasis or sequela of pulmonary edema. An atypical infectious process can have a similar appearance in the appropriate clinical setting. Electronically Signed   By: Constance Holster M.D.   On: 06/06/2019 17:08   Ct Head Wo Contrast  Result Date: 06/06/2019 CLINICAL DATA:  Altered level of consciousness (LOC), unexplained; C-spine trauma, high clinical risk (NEXUS/CCR) EXAM: CT HEAD WITHOUT CONTRAST CT CERVICAL SPINE WITHOUT CONTRAST TECHNIQUE: Multidetector CT imaging of the head and cervical spine was performed following the standard protocol without intravenous contrast. Multiplanar CT image reconstructions of the cervical spine were also generated. COMPARISON:  Head and cervical spine CT 08/16/2018 FINDINGS: Mild motion artifact in the head and cervical spine. CT HEAD FINDINGS Brain: Similar degree of atrophy and chronic small vessel ischemia from prior. No intracranial hemorrhage, mass effect, or midline shift. No hydrocephalus. The basilar cisterns are patent. No evidence of territorial infarct or acute ischemia. No extra-axial or intracranial fluid collection. Vascular: Atherosclerosis of skullbase vasculature without hyperdense vessel or abnormal calcification. Skull: No fracture or focal lesion. Sinuses/Orbits: Paranasal sinuses and mastoid air cells are clear. The visualized orbits are unremarkable. Other: None. CT CERVICAL SPINE FINDINGS Alignment: No traumatic subluxation. Reversal of normal cervical lordosis. Skull base and vertebrae: No acute fracture, detailed evaluation slightly limited by motion. Vertebral body heights are maintained. The dens and skull base are intact, skull base better  assessed on concurrent head CT. Soft tissues and spinal canal: No prevertebral fluid or swelling. No visible canal hematoma. Disc levels: Multilevel disc space narrowing and endplate spurring most prominent at C5-C6. scattered facet arthropathy. Upper chest: Bilateral and pleural effusions and pulmonary edema, concordant with chest radiograph earlier this day. Other: Carotid atherosclerosis. IMPRESSION: 1. No acute intracranial abnormality. No skull fracture. Stable atrophy and chronic small vessel ischemia. 2. Multilevel degenerative change in the cervical spine without acute fracture or subluxation, allowing for mild motion artifact. 3. Carotid and skullbase atherosclerosis. Electronically Signed   By: Keith Rake M.D.   On: 06/06/2019 19:13   Ct Chest Wo Contrast  Result Date: 05/27/2019 CLINICAL DATA:  Shortness of breath, CHF exacerbation, cirrhosis EXAM: CT CHEST WITHOUT CONTRAST TECHNIQUE: Multidetector CT imaging of the chest was performed following the standard protocol without IV contrast. COMPARISON:  08/16/2018 FINDINGS: Cardiovascular: Aortic atherosclerosis. Cardiomegaly.  Three-vessel coronary artery calcifications. No pericardial effusion. Mediastinum/Nodes: No enlarged mediastinal, hilar, or axillary lymph nodes. Thyroid gland, trachea, and esophagus demonstrate no significant findings. Lungs/Pleura: Mild centrilobular emphysema. Small bilateral pleural effusions and associated atelectasis or consolidation. Mild interlobular septal thickening. Upper Abdomen: No acute abnormality.  Nodular contour of the liver. Musculoskeletal: No chest wall mass or suspicious bone lesions identified. IMPRESSION: 1. Small bilateral pleural effusions and associated atelectasis or consolidation. Mild interlobular septal thickening. Findings are consistent with mild edema. 2.  Cardiomegaly and coronary artery disease. 3.  Mild emphysema. Electronically Signed   By: Eddie Candle M.D.   On: 05/27/2019 17:34   Ct  Cervical Spine Wo Contrast  Result Date: 06/06/2019 CLINICAL DATA:  Altered level of consciousness (LOC), unexplained; C-spine trauma, high clinical risk (NEXUS/CCR) EXAM: CT HEAD WITHOUT CONTRAST CT CERVICAL SPINE WITHOUT CONTRAST TECHNIQUE: Multidetector CT imaging of the head and cervical spine was performed following the standard protocol without intravenous contrast. Multiplanar CT image reconstructions of the cervical spine were also generated. COMPARISON:  Head and cervical spine CT 08/16/2018 FINDINGS: Mild motion artifact in the head and cervical spine. CT HEAD FINDINGS Brain: Similar degree of atrophy and chronic small vessel ischemia from prior. No intracranial hemorrhage, mass effect, or midline shift. No hydrocephalus. The basilar cisterns are patent. No evidence of territorial infarct or acute ischemia. No extra-axial or intracranial fluid collection. Vascular: Atherosclerosis of skullbase vasculature without hyperdense vessel or abnormal calcification. Skull: No fracture or focal lesion. Sinuses/Orbits: Paranasal sinuses and mastoid air cells are clear. The visualized orbits are unremarkable. Other: None. CT CERVICAL SPINE FINDINGS Alignment: No traumatic subluxation. Reversal of normal cervical lordosis. Skull base and vertebrae: No acute fracture, detailed evaluation slightly limited by motion. Vertebral body heights are maintained. The dens and skull base are intact, skull base better assessed on concurrent head CT. Soft tissues and spinal canal: No prevertebral fluid or swelling. No visible canal hematoma. Disc levels: Multilevel disc space narrowing and endplate spurring most prominent at C5-C6. scattered facet arthropathy. Upper chest: Bilateral and pleural effusions and pulmonary edema, concordant with chest radiograph earlier this day. Other: Carotid atherosclerosis. IMPRESSION: 1. No acute intracranial abnormality. No skull fracture. Stable atrophy and chronic small vessel ischemia. 2.  Multilevel degenerative change in the cervical spine without acute fracture or subluxation, allowing for mild motion artifact. 3. Carotid and skullbase atherosclerosis. Electronically Signed   By: Keith Rake M.D.   On: 06/06/2019 19:13   Dg Chest Portable 1 View  Result Date: 06/12/2019 CLINICAL DATA:  Chest pain and shortness of breath. EXAM: PORTABLE CHEST 1 VIEW COMPARISON:  06/06/2019 FINDINGS: Cardiomediastinal silhouette is enlarged. Probable mild interstitial pulmonary edema and small pleural effusions. Osseous structures are without acute abnormality. Soft tissues are grossly normal. IMPRESSION: 1. Enlarged cardiac silhouette. 2. Probable mild interstitial pulmonary edema and small pleural effusions. Electronically Signed   By: Fidela Salisbury M.D.   On: 06/12/2019 16:06   Dg Chest Port 1 View  Result Date: 05/23/2019 CLINICAL DATA:  Shortness of breath EXAM: PORTABLE CHEST 1 VIEW COMPARISON:  04/16/2019 FINDINGS: Cardiomegaly. Pulmonary venous hypertension with early interstitial edema. No infiltrate, collapse or effusion. No acute bone finding. IMPRESSION: Congestive heart failure.  Cardiomegaly and interstitial edema. Electronically Signed   By: Nelson Chimes M.D.   On: 05/23/2019 16:46    Microbiology: Recent Results (from the past 240 hour(s))  SARS Coronavirus 2     Status: None   Collection Time: 06/06/19  7:55 PM  Result Value  Ref Range Status   SARS Coronavirus 2 NOT DETECTED NOT DETECTED Final    Comment: (NOTE) SARS-CoV-2 target nucleic acids are NOT DETECTED. The SARS-CoV-2 RNA is generally detectable in upper and lower respiratory specimens during the acute phase of infection.  Negative  results do not preclude SARS-CoV-2 infection, do not rule out co-infections with other pathogens, and should not be used as the sole basis for treatment or other patient management decisions.  Negative results must be combined with clinical observations, patient history, and  epidemiological information. The expected result is Not Detected. Fact Sheet for Patients: http://www.biofiredefense.com/wp-content/uploads/2020/03/BIOFIRE-COVID -19-patients.pdf Fact Sheet for Healthcare Providers: http://www.biofiredefense.com/wp-content/uploads/2020/03/BIOFIRE-COVID -19-hcp.pdf This test is not yet approved or cleared by the [place] and  has been authorized for detection and/or diagnosis of SARS-CoV-2 by FDA under an Emergency Use Authorization (EUA).  This EUA will remain in effec t (meaning this test can be used) for the duration of  the COVID-19 declaration under Section 564(b)(1) of the Act, 21 U.S.C. section 360bbb-3(b)(1), unless the authorization is terminated or revoked sooner. Performed at [facility], [place] [address], [place], [place] [address]   Urine culture     Status: None   Collection Time: 06/06/19 11:27 PM   Specimen: Urine, Random  Result Value Ref Range Status   Specimen Description URINE, RANDOM  Final   Special Requests NONE  Final   Culture   Final    NO GROWTH Performed at [facility], [place] [address], [place], [place] [address]    Report Status 06/08/2019 FINAL  Final  SARS Coronavirus 2 (CEPHEID - Performed in [facility] hospital lab), Hosp Order     Status: None   Collection Time: 06/12/19  5:08 PM   Specimen: Nasopharyngeal Swab  Result Value Ref Range Status   SARS Coronavirus 2 NEGATIVE NEGATIVE Final    Comment: (NOTE) If result is NEGATIVE SARS-CoV-2 target nucleic acids are NOT DETECTED. The SARS-CoV-2 RNA is generally detectable in upper and lower  respiratory specimens during the acute phase of infection. The lowest  concentration of SARS-CoV-2 viral copies this assay can detect is 250  copies / mL. A negative result does not preclude SARS-CoV-2 infection  and should not be used as the sole basis for treatment or other  patient management decisions.  A negative result may occur with  improper  specimen collection / handling, submission of specimen other  than nasopharyngeal swab, presence of viral mutation(s) within the  areas targeted by this assay, and inadequate number of viral copies  (<250 copies / mL). A negative result must be combined with clinical  observations, patient history, and epidemiological information. If result is POSITIVE SARS-CoV-2 target nucleic acids are DETECTED. The SARS-CoV-2 RNA is generally detectable in upper and lower  respiratory specimens dur ing the acute phase of infection.  Positive  results are indicative of active infection with SARS-CoV-2.  Clinical  correlation with patient history and other diagnostic information is  necessary to determine patient infection status.  Positive results do  not rule out bacterial infection or co-infection with other viruses. If result is PRESUMPTIVE POSTIVE SARS-CoV-2 nucleic acids MAY BE PRESENT.   A presumptive positive result was obtained on the submitted specimen  and confirmed on repeat testing.  While 2019 novel coronavirus  (SARS-CoV-2) nucleic acids may be present in the submitted sample  additional confirmatory testing may be necessary for epidemiological  and / or clinical management purposes  to differentiate between  SARS-CoV-2 and  other Sarbecovirus currently known to infect humans.  If clinically indicated additional testing with an alternate test  methodology [phone]) is advised. The SARS-CoV-2 RNA is generally  detectable in upper and lower respiratory sp ecimens during the acute  phase of infection. The expected result is Negative. Fact Sheet for Patients:  StrictlyIdeas.no Fact Sheet for Healthcare Providers: BankingDealers.co.za This test is not yet approved or cleared by the [place] FDA and has been authorized for detection and/or diagnosis of SARS-CoV-2 by FDA under an Emergency Use Authorization (EUA).  This EUA will remain in  effect (meaning this test can be used) for the duration of the COVID-19 declaration under Section 564(b)(1) of the Act, 21 U.S.C. section 360bbb-3(b)(1), unless the authorization is terminated or revoked sooner. Performed at [facility], [place] [address] [place], [place] [address]      Labs: Basic Metabolic Panel: Recent Labs  Lab 06/08/19 0848 06/09/19 0059 06/10/19 0602 06/12/19 1520 06/13/19 0947  NA 130* 130* 132* 131* 135  K 3.7 4.3 4.6 3.7 3.4*  CL 98 99 101 99 100  CO2 23 20* 24 20* 25  GLUCOSE 134* 124* 136* 134* 140*  BUN 22* 25* 27* 19 25*  CREATININE 1.87* 1.90* 1.74* 1.30* 1.71*  CALCIUM 8.8* 8.6* 8.8* 8.6* 8.7*  MG 1.9 2.0 2.2  --   --    Liver Function Tests: Recent Labs  Lab 06/06/19 1734 06/12/19 1520  AST 21 22  ALT 20 20  ALKPHOS 77 64  BILITOT 1.0 0.6  PROT 8.0 7.5  ALBUMIN 3.3* 3.1*   No results for input(s): LIPASE, AMYLASE in the last 168 hours. Recent Labs  Lab 06/06/19 1734  AMMONIA 47*   CBC: Recent Labs  Lab 06/07/19 0536 06/08/19 0848 06/09/19 0059 06/10/19 0602 06/12/19 1520  WBC 4.6 4.6 5.3 4.7 3.9*  NEUTROABS  --   --   --   --  2.4  HGB 10.6* 10.8* 10.9* 10.6* 10.7*  HCT 30.9* 32.6* 32.5* 31.5* 31.6*  MCV 89.8 92.1 92.9 92.9 91.6  PLT 164 175 186 171 212   Cardiac Enzymes: Recent Labs  Lab 06/09/19 0059 06/12/19 1520 06/12/19 2013 06/13/19 0107 06/13/19 0749  TROPONINI 0.03* 0.04* 0.04* 0.04* 0.04*   BNP: BNP (last 3 results) Recent Labs    05/23/19 1636 06/06/19 1752 06/12/19 1520  BNP 3,418.4* 1,314.8* 1,280.2*    ProBNP (last 3 results) No results for input(s): PROBNP in the last 8760 hours.  CBG: Recent Labs  Lab 06/10/19 0638 06/10/19 1156 06/12/19 2227 06/13/19 0614 06/13/19 1057  GLUCAP 140* 132* 182* 153* 127*       Signed:  Geradine Girt DO  [facility] 06/13/2019, 1:15 PM

## 2019-06-14 ENCOUNTER — Telehealth: Payer: Self-pay

## 2019-06-14 NOTE — Telephone Encounter (Signed)
Transition Care Management Follow-up Telephone Call Date of discharge and from where:  06/13/2019, [facility]  Call placed to # [phone] and message was left requesting a call back to this CM # [phone]

## 2019-06-15 ENCOUNTER — Telehealth: Payer: Self-pay

## 2019-06-15 NOTE — Telephone Encounter (Signed)
Transition Care Management Follow-up Telephone Call  Attempt # 2  Date of discharge and from where:  06/13/2019, [facility]  Call placed to # [phone] and message was left requesting a call back to this CM # [phone]

## 2019-06-16 ENCOUNTER — Telehealth (HOSPITAL_COMMUNITY): Payer: Self-pay

## 2019-06-16 ENCOUNTER — Telehealth (HOSPITAL_COMMUNITY): Payer: Self-pay | Admitting: Cardiology

## 2019-06-16 NOTE — Telephone Encounter (Signed)
Called and left message for patient to call back to confirm appt 06/22/2019 @12 :20 with Dr. Aundra Dubin for [facility].  Left appt info for patient.

## 2019-06-16 NOTE — Telephone Encounter (Signed)
Brad [name] contacted me requesting I call Brad [name] at [facility] to confirm his Torsemide dose for refill. I did same with patient on the phone. Rece stated he is going to pick up medication today. He states he will call me once same is picked up.  Patient reports he has all of his medication and is doing fine. He states he is staying with Rodena Piety but is about to be staying with his friend Kerry Dory. I advised patient I was going on vacation and for him to reach out to [name] if he needs anything. He agreed with same. Will follow up on July 6th.

## 2019-06-17 ENCOUNTER — Telehealth: Payer: Self-pay | Admitting: Physician Assistant

## 2019-06-17 NOTE — Telephone Encounter (Signed)
LVM for pre reg & sent message to my chart, has no email

## 2019-06-19 ENCOUNTER — Emergency Department (HOSPITAL_COMMUNITY): Payer: Medicaid Other

## 2019-06-19 ENCOUNTER — Other Ambulatory Visit: Payer: Self-pay

## 2019-06-19 ENCOUNTER — Inpatient Hospital Stay (HOSPITAL_COMMUNITY)
Admission: EM | Admit: 2019-06-19 | Discharge: 2019-06-22 | DRG: 641 | Discharge status: Against Medical Advice | Payer: Medicaid Other | Attending: Internal Medicine | Admitting: Internal Medicine

## 2019-06-19 DIAGNOSIS — B182 Chronic viral hepatitis C: Secondary | ICD-10-CM | POA: Diagnosis present

## 2019-06-19 DIAGNOSIS — R112 Nausea with vomiting, unspecified: Secondary | ICD-10-CM | POA: Diagnosis present

## 2019-06-19 DIAGNOSIS — Z8249 Family history of ischemic heart disease and other diseases of the circulatory system: Secondary | ICD-10-CM

## 2019-06-19 DIAGNOSIS — I5032 Chronic diastolic (congestive) heart failure: Secondary | ICD-10-CM | POA: Diagnosis not present

## 2019-06-19 DIAGNOSIS — K219 Gastro-esophageal reflux disease without esophagitis: Secondary | ICD-10-CM | POA: Diagnosis present

## 2019-06-19 DIAGNOSIS — R0602 Shortness of breath: Secondary | ICD-10-CM | POA: Diagnosis present

## 2019-06-19 DIAGNOSIS — I2510 Atherosclerotic heart disease of native coronary artery without angina pectoris: Secondary | ICD-10-CM | POA: Diagnosis present

## 2019-06-19 DIAGNOSIS — F1410 Cocaine abuse, uncomplicated: Secondary | ICD-10-CM | POA: Diagnosis present

## 2019-06-19 DIAGNOSIS — Z20828 Contact with and (suspected) exposure to other viral communicable diseases: Secondary | ICD-10-CM | POA: Diagnosis present

## 2019-06-19 DIAGNOSIS — N183 Chronic kidney disease, stage 3 unspecified: Secondary | ICD-10-CM | POA: Diagnosis present

## 2019-06-19 DIAGNOSIS — I4892 Unspecified atrial flutter: Secondary | ICD-10-CM | POA: Diagnosis not present

## 2019-06-19 DIAGNOSIS — Z79891 Long term (current) use of opiate analgesic: Secondary | ICD-10-CM

## 2019-06-19 DIAGNOSIS — F1910 Other psychoactive substance abuse, uncomplicated: Secondary | ICD-10-CM | POA: Diagnosis present

## 2019-06-19 DIAGNOSIS — I482 Chronic atrial fibrillation, unspecified: Secondary | ICD-10-CM | POA: Diagnosis present

## 2019-06-19 DIAGNOSIS — I130 Hypertensive heart and chronic kidney disease with heart failure and stage 1 through stage 4 chronic kidney disease, or unspecified chronic kidney disease: Secondary | ICD-10-CM | POA: Diagnosis present

## 2019-06-19 DIAGNOSIS — I4821 Permanent atrial fibrillation: Secondary | ICD-10-CM | POA: Diagnosis present

## 2019-06-19 DIAGNOSIS — Z888 Allergy status to other drugs, medicaments and biological substances status: Secondary | ICD-10-CM

## 2019-06-19 DIAGNOSIS — Z5329 Procedure and treatment not carried out because of patient's decision for other reasons: Secondary | ICD-10-CM | POA: Diagnosis not present

## 2019-06-19 DIAGNOSIS — E1122 Type 2 diabetes mellitus with diabetic chronic kidney disease: Secondary | ICD-10-CM | POA: Diagnosis present

## 2019-06-19 DIAGNOSIS — E871 Hypo-osmolality and hyponatremia: Secondary | ICD-10-CM | POA: Diagnosis not present

## 2019-06-19 DIAGNOSIS — I428 Other cardiomyopathies: Secondary | ICD-10-CM | POA: Diagnosis present

## 2019-06-19 DIAGNOSIS — Z8041 Family history of malignant neoplasm of ovary: Secondary | ICD-10-CM

## 2019-06-19 DIAGNOSIS — Z79899 Other long term (current) drug therapy: Secondary | ICD-10-CM

## 2019-06-19 DIAGNOSIS — R197 Diarrhea, unspecified: Secondary | ICD-10-CM

## 2019-06-19 DIAGNOSIS — E7800 Pure hypercholesterolemia, unspecified: Secondary | ICD-10-CM | POA: Diagnosis present

## 2019-06-19 DIAGNOSIS — Z7901 Long term (current) use of anticoagulants: Secondary | ICD-10-CM

## 2019-06-19 DIAGNOSIS — M1990 Unspecified osteoarthritis, unspecified site: Secondary | ICD-10-CM | POA: Diagnosis present

## 2019-06-19 DIAGNOSIS — G8929 Other chronic pain: Secondary | ICD-10-CM | POA: Diagnosis present

## 2019-06-19 DIAGNOSIS — I252 Old myocardial infarction: Secondary | ICD-10-CM

## 2019-06-19 DIAGNOSIS — E1140 Type 2 diabetes mellitus with diabetic neuropathy, unspecified: Secondary | ICD-10-CM

## 2019-06-19 DIAGNOSIS — R008 Other abnormalities of heart beat: Secondary | ICD-10-CM | POA: Diagnosis present

## 2019-06-19 DIAGNOSIS — F17210 Nicotine dependence, cigarettes, uncomplicated: Secondary | ICD-10-CM | POA: Diagnosis present

## 2019-06-19 DIAGNOSIS — Z803 Family history of malignant neoplasm of breast: Secondary | ICD-10-CM

## 2019-06-19 DIAGNOSIS — I5042 Chronic combined systolic (congestive) and diastolic (congestive) heart failure: Secondary | ICD-10-CM | POA: Diagnosis present

## 2019-06-19 DIAGNOSIS — Z8673 Personal history of transient ischemic attack (TIA), and cerebral infarction without residual deficits: Secondary | ICD-10-CM

## 2019-06-19 DIAGNOSIS — Z794 Long term (current) use of insulin: Secondary | ICD-10-CM

## 2019-06-19 DIAGNOSIS — E876 Hypokalemia: Secondary | ICD-10-CM | POA: Diagnosis present

## 2019-06-19 DIAGNOSIS — F1010 Alcohol abuse, uncomplicated: Secondary | ICD-10-CM | POA: Diagnosis present

## 2019-06-19 DIAGNOSIS — K7460 Unspecified cirrhosis of liver: Secondary | ICD-10-CM | POA: Diagnosis present

## 2019-06-19 DIAGNOSIS — F329 Major depressive disorder, single episode, unspecified: Secondary | ICD-10-CM | POA: Diagnosis present

## 2019-06-19 DIAGNOSIS — E861 Hypovolemia: Secondary | ICD-10-CM | POA: Diagnosis present

## 2019-06-19 DIAGNOSIS — Z8546 Personal history of malignant neoplasm of prostate: Secondary | ICD-10-CM

## 2019-06-19 LAB — MAGNESIUM: Magnesium: 1.5 mg/dL — ABNORMAL LOW (ref 1.7–2.4)

## 2019-06-19 LAB — CBC WITH DIFFERENTIAL/PLATELET
Abs Immature Granulocytes: 0.01 10*3/uL (ref 0.00–0.07)
Basophils Absolute: 0.1 10*3/uL (ref 0.0–0.1)
Basophils Relative: 1 %
Eosinophils Absolute: 0 10*3/uL (ref 0.0–0.5)
Eosinophils Relative: 1 %
HCT: 35.2 % — ABNORMAL LOW (ref 39.0–52.0)
Hemoglobin: 12.3 g/dL — ABNORMAL LOW (ref 13.0–17.0)
Immature Granulocytes: 0 %
Lymphocytes Relative: 15 %
Lymphs Abs: 0.6 10*3/uL — ABNORMAL LOW (ref 0.7–4.0)
MCH: 31 pg (ref 26.0–34.0)
MCHC: 34.9 g/dL (ref 30.0–36.0)
MCV: 88.7 fL (ref 80.0–100.0)
Monocytes Absolute: 0.7 10*3/uL (ref 0.1–1.0)
Monocytes Relative: 18 %
Neutro Abs: 2.5 10*3/uL (ref 1.7–7.7)
Neutrophils Relative %: 65 %
Platelets: 202 10*3/uL (ref 150–400)
RBC: 3.97 MIL/uL — ABNORMAL LOW (ref 4.22–5.81)
RDW: 15.1 % (ref 11.5–15.5)
WBC: 3.9 10*3/uL — ABNORMAL LOW (ref 4.0–10.5)
nRBC: 0 % (ref 0.0–0.2)

## 2019-06-19 LAB — URINALYSIS, ROUTINE W REFLEX MICROSCOPIC
Bacteria, UA: NONE SEEN
Bilirubin Urine: NEGATIVE
Glucose, UA: NEGATIVE mg/dL
Ketones, ur: NEGATIVE mg/dL
Leukocytes,Ua: NEGATIVE
Nitrite: NEGATIVE
Protein, ur: 100 mg/dL — AB
Specific Gravity, Urine: 1.006 (ref 1.005–1.030)
pH: 5 (ref 5.0–8.0)

## 2019-06-19 LAB — BASIC METABOLIC PANEL
Anion gap: 11 (ref 5–15)
Anion gap: 10 (ref 5–15)
BUN: 13 mg/dL (ref 6–20)
BUN: 16 mg/dL (ref 6–20)
CO2: 24 mmol/L (ref 22–32)
CO2: 26 mmol/L (ref 22–32)
Calcium: 8.6 mg/dL — ABNORMAL LOW (ref 8.9–10.3)
Calcium: 8.5 mg/dL — ABNORMAL LOW (ref 8.9–10.3)
Chloride: 86 mmol/L — ABNORMAL LOW (ref 98–111)
Chloride: 87 mmol/L — ABNORMAL LOW (ref 98–111)
Creatinine, Ser: 1.36 mg/dL — ABNORMAL HIGH (ref 0.61–1.24)
Creatinine, Ser: 1.42 mg/dL — ABNORMAL HIGH (ref 0.61–1.24)
GFR calc Af Amer: >60 mL/min (ref >60)
GFR calc Af Amer: >60 mL/min (ref >60)
GFR calc non Af Amer: 57 mL/min — ABNORMAL LOW (ref >60)
GFR calc non Af Amer: 54 mL/min — ABNORMAL LOW (ref >60)
Glucose, Bld: 178 mg/dL — ABNORMAL HIGH (ref 70–99)
Glucose, Bld: 119 mg/dL — ABNORMAL HIGH (ref 70–99)
Potassium: 3.5 mmol/L (ref 3.5–5.1)
Potassium: 3.1 mmol/L — ABNORMAL LOW (ref 3.5–5.1)
Sodium: 121 mmol/L — ABNORMAL LOW (ref 135–145)
Sodium: 123 mmol/L — ABNORMAL LOW (ref 135–145)

## 2019-06-19 LAB — HEPATIC FUNCTION PANEL
ALT: 17 U/L (ref 0–44)
AST: 24 U/L (ref 15–41)
Albumin: 3.2 g/dL — ABNORMAL LOW (ref 3.5–5.0)
Alkaline Phosphatase: 63 U/L (ref 38–126)
Bilirubin, Direct: 0.3 mg/dL — ABNORMAL HIGH (ref 0.0–0.2)
Indirect Bilirubin: 1.1 mg/dL — ABNORMAL HIGH (ref 0.3–0.9)
Total Bilirubin: 1.4 mg/dL — ABNORMAL HIGH (ref 0.3–1.2)
Total Protein: 7.8 g/dL (ref 6.5–8.1)

## 2019-06-19 LAB — OSMOLALITY: Osmolality: 266 mOsm/kg — ABNORMAL LOW (ref 275–295)

## 2019-06-19 LAB — TSH: TSH: 2.825 u[IU]/mL (ref 0.350–4.500)

## 2019-06-19 LAB — RAPID URINE DRUG SCREEN, HOSP PERFORMED
Amphetamines: NOT DETECTED
Barbiturates: NOT DETECTED
Benzodiazepines: NOT DETECTED
Cocaine: POSITIVE — AB
Opiates: NOT DETECTED
Tetrahydrocannabinol: NOT DETECTED

## 2019-06-19 LAB — OSMOLALITY, URINE: Osmolality, Ur: 166 mOsm/kg — ABNORMAL LOW (ref 300–900)

## 2019-06-19 LAB — SARS CORONAVIRUS 2 BY RT PCR (HOSPITAL ORDER, PERFORMED IN ~~LOC~~ HOSPITAL LAB): SARS Coronavirus 2: NEGATIVE

## 2019-06-19 LAB — GLUCOSE, CAPILLARY
Glucose-Capillary: 212 mg/dL — ABNORMAL HIGH (ref 70–99)
Glucose-Capillary: 140 mg/dL — ABNORMAL HIGH (ref 70–99)

## 2019-06-19 LAB — BRAIN NATRIURETIC PEPTIDE: B Natriuretic Peptide: 2792 pg/mL — ABNORMAL HIGH (ref 0.0–100.0)

## 2019-06-19 LAB — CREATININE, URINE, RANDOM: Creatinine, Urine: 65.48 mg/dL

## 2019-06-19 LAB — SODIUM, URINE, RANDOM: Sodium, Ur: <10 mmol/L

## 2019-06-19 LAB — TROPONIN I (HIGH SENSITIVITY)
Troponin I (High Sensitivity): 32 ng/L — ABNORMAL HIGH (ref <18)
Troponin I (High Sensitivity): 31 ng/L — ABNORMAL HIGH (ref <18)
Troponin I (High Sensitivity): 32 ng/L — ABNORMAL HIGH (ref <18)

## 2019-06-19 MED ORDER — HYDRALAZINE HCL 20 MG/ML IJ SOLN: 10.0000 mg | Freq: Four times a day (QID) | INTRAMUSCULAR | Status: DC | PRN

## 2019-06-19 MED ORDER — MAGNESIUM SULFATE 2 GM/50ML IV SOLN
2.0000 g | Freq: Once | INTRAVENOUS | Status: AC
  Administered 2019-06-19: 2 g via INTRAVENOUS
  Filled 2019-06-19: qty 50

## 2019-06-19 MED ORDER — POTASSIUM CHLORIDE CRYS ER 20 MEQ PO TBCR
40.0000 meq | EXTENDED_RELEASE_TABLET | Freq: Once | ORAL | Status: AC
  Administered 2019-06-19: 23:00:00 40 meq via ORAL
  Filled 2019-06-19: qty 2

## 2019-06-19 MED ORDER — NICOTINE 14 MG/24HR TD PT24
14.0000 mg | MEDICATED_PATCH | Freq: Every day | TRANSDERMAL | Status: DC
  Administered 2019-06-19 – 2019-06-22 (×4): 14 mg via TRANSDERMAL
  Filled 2019-06-19 (×4): qty 1

## 2019-06-19 MED ORDER — FERROUS SULFATE 325 (65 FE) MG PO TABS
325.0000 mg | ORAL_TABLET | Freq: Every day | ORAL | Status: DC
  Administered 2019-06-20 – 2019-06-22 (×3): 325 mg via ORAL
  Filled 2019-06-19 (×3): qty 1

## 2019-06-19 MED ORDER — APIXABAN 5 MG PO TABS
5.0000 mg | ORAL_TABLET | Freq: Two times a day (BID) | ORAL | Status: DC
  Administered 2019-06-19 – 2019-06-22 (×6): 5 mg via ORAL
  Filled 2019-06-19 (×6): qty 1

## 2019-06-19 MED ORDER — LORATADINE 10 MG PO TABS
10.0000 mg | ORAL_TABLET | Freq: Every day | ORAL | Status: DC
  Administered 2019-06-20 – 2019-06-22 (×3): 10 mg via ORAL
  Filled 2019-06-19 (×4): qty 1

## 2019-06-19 MED ORDER — FOLIC ACID 1 MG PO TABS
1.0000 mg | ORAL_TABLET | Freq: Every day | ORAL | Status: DC
  Administered 2019-06-20 – 2019-06-22 (×3): 1 mg via ORAL
  Filled 2019-06-19 (×4): qty 1

## 2019-06-19 MED ORDER — LORAZEPAM 2 MG/ML IJ SOLN
1.0000 mg | Freq: Once | INTRAMUSCULAR | Status: AC
  Administered 2019-06-19: 1 mg via INTRAVENOUS
  Filled 2019-06-19: qty 1

## 2019-06-19 MED ORDER — PANTOPRAZOLE SODIUM 40 MG PO TBEC
40.0000 mg | DELAYED_RELEASE_TABLET | Freq: Every day | ORAL | Status: DC
  Administered 2019-06-20 – 2019-06-22 (×3): 40 mg via ORAL
  Filled 2019-06-19 (×4): qty 1

## 2019-06-19 MED ORDER — VITAMIN B-1 100 MG PO TABS
100.0000 mg | ORAL_TABLET | Freq: Every day | ORAL | Status: DC
  Administered 2019-06-20 – 2019-06-22 (×3): 100 mg via ORAL
  Filled 2019-06-19 (×4): qty 1

## 2019-06-19 MED ORDER — NITROGLYCERIN 0.4 MG SL SUBL: 0.4000 mg | SUBLINGUAL_TABLET | SUBLINGUAL | Status: DC | PRN

## 2019-06-19 MED ORDER — SODIUM CHLORIDE 0.9 % IV SOLN: INTRAVENOUS | Status: AC

## 2019-06-19 MED ORDER — OXYCODONE HCL 5 MG PO TABS
5.0000 mg | ORAL_TABLET | Freq: Two times a day (BID) | ORAL | Status: DC | PRN
  Administered 2019-06-19 – 2019-06-21 (×5): 5 mg via ORAL
  Filled 2019-06-19 (×5): qty 1

## 2019-06-19 MED ORDER — DILTIAZEM HCL 30 MG PO TABS
30.0000 mg | ORAL_TABLET | Freq: Two times a day (BID) | ORAL | Status: DC
  Administered 2019-06-19 – 2019-06-22 (×6): 30 mg via ORAL
  Filled 2019-06-19 (×7): qty 1

## 2019-06-19 MED ORDER — INSULIN ASPART 100 UNIT/ML ~~LOC~~ SOLN: 0.0000 [IU] | Freq: Every day | SUBCUTANEOUS | Status: DC

## 2019-06-19 MED ORDER — ISOSORBIDE MONONITRATE ER 30 MG PO TB24
30.0000 mg | ORAL_TABLET | Freq: Every day | ORAL | Status: DC
  Administered 2019-06-20 – 2019-06-22 (×3): 30 mg via ORAL
  Filled 2019-06-19 (×4): qty 1

## 2019-06-19 MED ORDER — ATORVASTATIN CALCIUM 40 MG PO TABS
40.0000 mg | ORAL_TABLET | Freq: Every day | ORAL | Status: DC
  Administered 2019-06-20 – 2019-06-22 (×3): 40 mg via ORAL
  Filled 2019-06-19 (×4): qty 1

## 2019-06-19 MED ORDER — SODIUM CHLORIDE 0.9 % IV SOLN
INTRAVENOUS | Status: AC
  Administered 2019-06-19: 15:00:00 via INTRAVENOUS

## 2019-06-19 MED ORDER — MORPHINE SULFATE (PF) 2 MG/ML IV SOLN: 1.0000 mg | Freq: Four times a day (QID) | INTRAVENOUS | Status: DC | PRN

## 2019-06-19 MED ORDER — LORAZEPAM 2 MG/ML IJ SOLN
1.0000 mg | Freq: Four times a day (QID) | INTRAMUSCULAR | Status: DC | PRN
  Administered 2019-06-19 – 2019-06-21 (×4): 1 mg via INTRAVENOUS
  Filled 2019-06-19 (×4): qty 1

## 2019-06-19 MED ORDER — GABAPENTIN 300 MG PO CAPS
600.0000 mg | ORAL_CAPSULE | Freq: Two times a day (BID) | ORAL | Status: DC
  Administered 2019-06-19 – 2019-06-20 (×2): 600 mg via ORAL
  Filled 2019-06-19 (×2): qty 2

## 2019-06-19 MED ORDER — ADULT MULTIVITAMIN W/MINERALS CH
1.0000 | ORAL_TABLET | Freq: Every day | ORAL | Status: DC
  Administered 2019-06-20 – 2019-06-22 (×3): 1 via ORAL
  Filled 2019-06-19 (×4): qty 1

## 2019-06-19 MED ORDER — INSULIN ASPART 100 UNIT/ML ~~LOC~~ SOLN
0.0000 [IU] | Freq: Three times a day (TID) | SUBCUTANEOUS | Status: DC
  Administered 2019-06-19: 3 [IU] via SUBCUTANEOUS
  Administered 2019-06-20: 2 [IU] via SUBCUTANEOUS
  Administered 2019-06-20: 1 [IU] via SUBCUTANEOUS
  Administered 2019-06-20 – 2019-06-21 (×3): 2 [IU] via SUBCUTANEOUS
  Administered 2019-06-22: 1 [IU] via SUBCUTANEOUS

## 2019-06-19 MED ORDER — HYDRALAZINE HCL 25 MG PO TABS
25.0000 mg | ORAL_TABLET | Freq: Two times a day (BID) | ORAL | Status: DC
  Administered 2019-06-19 – 2019-06-22 (×6): 25 mg via ORAL
  Filled 2019-06-19 (×7): qty 1
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
## 2019-06-19 NOTE — Progress Notes (Addendum)
Patient complained of 8 out of 10 chest pain upon arrival to unit.   Dr.  Earnest Conroy notified stated she would order nitro and prn bp medication.  Dr Earnest Conroy stated ok to get EKG now but patient has history of chronic angina and we do not need ekg each time of chest pain.   Patient refusing Nitroglycerin stating "I don't like that stuff" and wanting something different.     Dr Earnest Conroy would like BMET drawn at 2000 (this is ordered) she would like to be paged with Sodium level result. N. Kamineni=[phone].   Will notify night shift RN at handoff.   RN notified of 5 beats VT by CMT at 1731--Dr [facility] aware  EKG obtained at 1735--too much artifact related to muscle twitches.  Dr Earnest Conroy notified, order received to give ativan and obtain new ekg.  Follow up 1920 Dr Earnest Conroy stated she saw new EKG, no new orders at this time.

## 2019-06-19 NOTE — ED Notes (Signed)
Attempted report X2 

## 2019-06-19 NOTE — ED Notes (Signed)
Attempted Report 
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
## 2019-06-19 NOTE — ED Notes (Signed)
RN Alexa, charted under my name while I was in the room hooking the pt up to monitor.

## 2019-06-19 NOTE — ED Triage Notes (Signed)
Pt brought in by ems for c/o sob/chest pain/ nausea  that began this morning ; pt further reports having diarrhea for the past couple of days  Along with a decreased appetite , pt denies any coughing or fevers  Or abd pain or vomiting

## 2019-06-19 NOTE — Progress Notes (Signed)
Patient states he took medications today prior to admission but does not remember what he took.

## 2019-06-19 NOTE — ED Notes (Signed)
Ice chips provided as per MD

## 2019-06-20 DIAGNOSIS — E1140 Type 2 diabetes mellitus with diabetic neuropathy, unspecified: Secondary | ICD-10-CM | POA: Diagnosis present

## 2019-06-20 DIAGNOSIS — N183 Chronic kidney disease, stage 3 (moderate): Secondary | ICD-10-CM | POA: Diagnosis present

## 2019-06-20 DIAGNOSIS — Z5329 Procedure and treatment not carried out because of patient's decision for other reasons: Secondary | ICD-10-CM | POA: Diagnosis not present

## 2019-06-20 DIAGNOSIS — E871 Hypo-osmolality and hyponatremia: Principal | ICD-10-CM

## 2019-06-20 DIAGNOSIS — E861 Hypovolemia: Secondary | ICD-10-CM | POA: Diagnosis present

## 2019-06-20 DIAGNOSIS — G8929 Other chronic pain: Secondary | ICD-10-CM | POA: Diagnosis present

## 2019-06-20 DIAGNOSIS — R112 Nausea with vomiting, unspecified: Secondary | ICD-10-CM | POA: Diagnosis present

## 2019-06-20 DIAGNOSIS — E876 Hypokalemia: Secondary | ICD-10-CM | POA: Diagnosis present

## 2019-06-20 DIAGNOSIS — K219 Gastro-esophageal reflux disease without esophagitis: Secondary | ICD-10-CM | POA: Diagnosis present

## 2019-06-20 DIAGNOSIS — R008 Other abnormalities of heart beat: Secondary | ICD-10-CM | POA: Diagnosis present

## 2019-06-20 DIAGNOSIS — F1410 Cocaine abuse, uncomplicated: Secondary | ICD-10-CM | POA: Diagnosis present

## 2019-06-20 DIAGNOSIS — B182 Chronic viral hepatitis C: Secondary | ICD-10-CM | POA: Diagnosis present

## 2019-06-20 DIAGNOSIS — I428 Other cardiomyopathies: Secondary | ICD-10-CM | POA: Diagnosis present

## 2019-06-20 DIAGNOSIS — I130 Hypertensive heart and chronic kidney disease with heart failure and stage 1 through stage 4 chronic kidney disease, or unspecified chronic kidney disease: Secondary | ICD-10-CM | POA: Diagnosis present

## 2019-06-20 DIAGNOSIS — F1010 Alcohol abuse, uncomplicated: Secondary | ICD-10-CM | POA: Diagnosis present

## 2019-06-20 DIAGNOSIS — Z7901 Long term (current) use of anticoagulants: Secondary | ICD-10-CM | POA: Diagnosis not present

## 2019-06-20 DIAGNOSIS — I4892 Unspecified atrial flutter: Secondary | ICD-10-CM | POA: Diagnosis present

## 2019-06-20 DIAGNOSIS — E1122 Type 2 diabetes mellitus with diabetic chronic kidney disease: Secondary | ICD-10-CM | POA: Diagnosis present

## 2019-06-20 DIAGNOSIS — Z20828 Contact with and (suspected) exposure to other viral communicable diseases: Secondary | ICD-10-CM | POA: Diagnosis present

## 2019-06-20 DIAGNOSIS — Z794 Long term (current) use of insulin: Secondary | ICD-10-CM | POA: Diagnosis not present

## 2019-06-20 DIAGNOSIS — K7460 Unspecified cirrhosis of liver: Secondary | ICD-10-CM | POA: Diagnosis present

## 2019-06-20 DIAGNOSIS — I5042 Chronic combined systolic (congestive) and diastolic (congestive) heart failure: Secondary | ICD-10-CM | POA: Diagnosis present

## 2019-06-20 DIAGNOSIS — I4821 Permanent atrial fibrillation: Secondary | ICD-10-CM | POA: Diagnosis present

## 2019-06-20 LAB — COMPREHENSIVE METABOLIC PANEL
ALT: 16 U/L (ref 0–44)
AST: 25 U/L (ref 15–41)
Albumin: 2.9 g/dL — ABNORMAL LOW (ref 3.5–5.0)
Alkaline Phosphatase: 62 U/L (ref 38–126)
Anion gap: 10 (ref 5–15)
BUN: 21 mg/dL — ABNORMAL HIGH (ref 6–20)
CO2: 24 mmol/L (ref 22–32)
Calcium: 8.5 mg/dL — ABNORMAL LOW (ref 8.9–10.3)
Chloride: 89 mmol/L — ABNORMAL LOW (ref 98–111)
Creatinine, Ser: 1.39 mg/dL — ABNORMAL HIGH (ref 0.61–1.24)
GFR calc Af Amer: >60 mL/min (ref >60)
GFR calc non Af Amer: 55 mL/min — ABNORMAL LOW (ref >60)
Glucose, Bld: 161 mg/dL — ABNORMAL HIGH (ref 70–99)
Potassium: 3.8 mmol/L (ref 3.5–5.1)
Sodium: 123 mmol/L — ABNORMAL LOW (ref 135–145)
Total Bilirubin: 1.2 mg/dL (ref 0.3–1.2)
Total Protein: 7.1 g/dL (ref 6.5–8.1)

## 2019-06-20 LAB — BASIC METABOLIC PANEL
Anion gap: 8 (ref 5–15)
BUN: 21 mg/dL — ABNORMAL HIGH (ref 6–20)
CO2: 25 mmol/L (ref 22–32)
Calcium: 8.4 mg/dL — ABNORMAL LOW (ref 8.9–10.3)
Chloride: 88 mmol/L — ABNORMAL LOW (ref 98–111)
Creatinine, Ser: 1.28 mg/dL — ABNORMAL HIGH (ref 0.61–1.24)
GFR calc Af Amer: >60 mL/min (ref >60)
GFR calc non Af Amer: >60 mL/min (ref >60)
Glucose, Bld: 176 mg/dL — ABNORMAL HIGH (ref 70–99)
Potassium: 4.4 mmol/L (ref 3.5–5.1)
Sodium: 121 mmol/L — ABNORMAL LOW (ref 135–145)

## 2019-06-20 LAB — GLUCOSE, CAPILLARY
Glucose-Capillary: 186 mg/dL — ABNORMAL HIGH (ref 70–99)
Glucose-Capillary: 155 mg/dL — ABNORMAL HIGH (ref 70–99)
Glucose-Capillary: 137 mg/dL — ABNORMAL HIGH (ref 70–99)
Glucose-Capillary: 163 mg/dL — ABNORMAL HIGH (ref 70–99)

## 2019-06-20 MED ORDER — POTASSIUM CHLORIDE CRYS ER 20 MEQ PO TBCR
40.0000 meq | EXTENDED_RELEASE_TABLET | Freq: Once | ORAL | Status: AC
  Administered 2019-06-20: 40 meq via ORAL
  Filled 2019-06-20: qty 2

## 2019-06-20 MED ORDER — MAGNESIUM SULFATE 4 GM/100ML IV SOLN
4.0000 g | Freq: Once | INTRAVENOUS | Status: AC
  Administered 2019-06-20: 4 g via INTRAVENOUS
  Filled 2019-06-20: qty 100

## 2019-06-20 MED ORDER — SODIUM CHLORIDE 0.9 % IV SOLN
INTRAVENOUS | Status: AC
  Administered 2019-06-20: 12:00:00 via INTRAVENOUS

## 2019-06-20 MED ORDER — ONDANSETRON HCL 4 MG/2ML IJ SOLN
4.0000 mg | Freq: Four times a day (QID) | INTRAMUSCULAR | Status: DC | PRN
  Administered 2019-06-20: 4 mg via INTRAVENOUS
  Filled 2019-06-20: qty 2

## 2019-06-20 NOTE — Progress Notes (Signed)
Pt ambulated in hall. Tolerated well with no distress noted.

## 2019-06-20 NOTE — Progress Notes (Addendum)
PROGRESS NOTE    Collen Vincent  XHB:[id number] DOB: [birth year] DOA: 06/19/2019 PCP: Charlott Rakes, MD    Brief Narrative:58 y.o. male with history h/o hepatitis C, cirrhosis, CKD stage III, paroxysmal A. fib/a flutter, recurrent admissions for combined systolic and diastolic CHF, cocaine use,chronic chest pain, GERD, hypertension who was recently admitted to this [facility] on June 21 for mild CHF exacerbation and discharged with increased dose of torsemide (from 20 mg daily to 60 mg twice daily) with instructions to follow-up closely with Dr. Aundra Dubin, now presents with complaints of muscle twitching in upper and lower extremities.  Patient states he filled his new prescription for torsemide on June 25 but was taking extra dose of home diuretic until then. He complains of feeling nauseous with no appetite for last 2 days with some lower abdominal discomfort.  He states his 2 sons visited him yesterday after 16 years of being estranged.  He ordered food from [employer] including a barbecue sandwich and strawberry cheesecake milkshake.  He did not eat much of the sandwich but had a milkshake.  He reports 3-4 loose bowel movements yesterday and about 10 semi-formed/loose bowel movements today.  He was dry heaving and feeling extremely nauseous.  He came to the ED as he got concerned when he started to have muscle twitching in his extremities. Patient states his leg swellings are much better than baseline.  He reports feeling dyspneic on exertion and on sleeping but states can walk more than 200 feet without shortness of breath and uses only 1 pillow to sleep.  He also describes chest discomfort as chest tightness, 8/10, nonradiating which is almost always there with some worsening today. He admits to cocaine use last Wednesday.  He denies any cough, fever or chills.  Denies any melena, hematochezia or hematemesis. Patient appears comfortable in no respiratory distress in the ED.  His vital signs are  stable and saturating 98 200% on room air.  Work-up in the ED revealed WBC 3.9, hemoglobin 12.8, sodium 121 (baseline 127-132), potassium of 3.5, chloride 86, BUN 13, creatinine 1.36 (baseline 1.3-1.7), glucose 178, calcium 8.6, magnesium 1.5, BNP at 2792 (was 3400 on 6/1 and 1280 in last admission).  Chest x-ray reported small left pleural effusion and left lower lobe consolidation possibly pneumonia.  Patient is requested to be admitted to medical service for further evaluation and management.  He is receiving magnesium IV 2 g infusion.  6/29 covid negative UDS positive for cocaine, complains of reproducible chest pain denies shortness of breath  Assessment & Plan:   Principal Problem:   Hyponatremia Active Problems:   GERD (gastroesophageal reflux disease)   Chronic diastolic congestive heart failure (HCC)   Polysubstance abuse (HCC)   Chronic hepatitis C with cirrhosis (HCC)   CKD (chronic kidney disease) stage 3, GFR 30-59 ml/min (HCC)   Cocaine abuse (HCC)   Atrial flutter (HCC)   Cirrhosis (HCC)   Shortness of breath   Permanent atrial fibrillation   Chronic atrial fibrillation   Hypomagnesemia  1. Acute on chronic  Symptomatic hypovolemic hyponatremia: This is more likely multifactorial nutritional as well as abrupt increase in diuresis with higher dose of diuretics than that he is used to and alcohol abuse.  Patient received very slow normal saline IV hydration overnight sodium improved to 123.  I will continue slow IV hydration and recheck his labs later today.  At this time he denies any nausea vomiting abdominal pain his only complaint is reproducible anterior chest wall pain.  He admits to using cocaine recently and his urine drug screen is positive for cocaine.  He does not appear to be in any volume overload at this time he is resting flat without any difficulty with breathing.  Monitor closely with history of congestive heart failure and IV fluid ongoing. He needs to be  continually monitored in the hospital till his electrolytes are back to baseline prior to discharge.  He has hypokalemia hypomagnesemia and hyponatremia.  2.  Chronic diastolic CHF with chronic chest pain/dyspnea: She has chronic chest pain which is reproducible. In the setting of ongoing cocaine use.  His high-sensitivity troponins has been 32/31/32 which is mildly elevated.  Patient's recent echo on June 5 showed EF of 30 to 61%, diastolic dysfunction as well as reduced right ventricular systolic function.  Patient only has trace leg edema, his BNP elevation appears to be chronic in the setting of biventricular dilatation.  EKG does not show any acute ST-T changes but shows ventricular trigeminy. Counseled patient regarding fatal risks of vasospasm/MI/arrhythmias with ongoing cocaine use.   3.  Nausea, vomiting, diarrhea with muscle twitching -probably related to multiple electrolyte abnormalities.  However I will hold his gabapentin as this is known to have caused muscle twitching.  He has not received any of this since admission.  Resolving  4.  Paroxysmal A. fib/flutter: Resume home medications Cardizem and chronic anticoagulation with Eliquis  5.   Hypokalemia with hypomagnesemia could be contributing to muscle twitching.  Will replete both.    6. CKD stage III: Serum creatinine appears to be at baseline (1.3-1.7)  7.  Hypertension: Resume home medications.  Advised patient to quit cocaine use.  8.  Hepatitis C/cirrhosis: No clinical signs of fluid overload currently.  Hold torsemide, ?Not on Aldactone.  9. GERD: Resume home medications  10.  Diabetes mellitus: Patient on low-dose of Lantus (5 units) at home.  Given variable oral intake, will watch on diabetic diet/sliding scale insulin.  11. Polysubstance abuse: Patient admits to cocaine use last Wednesday.  Urine tox screen positive for cocaine.  He continues to smoke tobacco and drink alcohol and use cocaine.  We will continue  nicotine patch and Ativan as needed.    DVT prophylaxis: On Eliquis  Code Status:  Full code  patient/Family Communication:  Discussed with the patient consults called: None at this time    Estimated body mass index is 24.32 kg/m as calculated from the following:   Height as of this encounter: 5\' 11"  (1.803 m).   Weight as of this encounter: 79.1 kg.    Subjective:  Resting in bed complaining of chest pain no shortness of breath chest pain is worse with breathing however it is very reproducible and superficial. Objective: Vitals:   06/19/19 2003 06/20/19 0626 06/20/19 0638 06/20/19 0842  BP: 125/83  111/71 (!) 122/98  Pulse: 99 88    Resp: 18 (!) 21 18 14   Temp: 97.8 F (36.6 C)  97.7 F (36.5 C) 98.7 F (37.1 C)  TempSrc: Oral  Oral Oral  SpO2: 99% 98% 98% 98%  Weight:   79.1 kg   Height:        Intake/Output Summary (Last 24 hours) at 06/20/2019 0942 Last data filed at 06/20/2019 0300 Gross per 24 hour  Intake 600 ml  Output 450 ml  Net 150 ml   Filed Weights   06/19/19 1708 06/20/19 0638  Weight: 79.1 kg 79.1 kg    Examination: Tenderness to touch anterior chest wall General  exam: Appears calm and comfortable  Respiratory system: Clear to auscultation. Respiratory effort normal. Cardiovascular system: S1 & S2 heard, RRR. No JVD, murmurs, rubs, gallops or clicks. No pedal edema. Gastrointestinal system: Abdomen is nondistended, soft and nontender. No organomegaly or masses felt. Normal bowel sounds heard. Central nervous system: Alert and oriented. No focal neurological deficits. Extremities: Symmetric 5 x 5 power. Skin: No rashes, lesions or ulcers Psychiatry: Judgement and insight appear normal. Mood & affect appropriate.     Data Reviewed: I have personally reviewed following labs and imaging studies  CBC: Recent Labs  Lab 06/19/19 1144  WBC 3.9*  NEUTROABS 2.5  HGB 12.3*  HCT 35.2*  MCV 88.7  PLT 829   Basic Metabolic Panel: Recent Labs   Lab 06/13/19 0947 06/19/19 1144 06/19/19 2017 06/20/19 0637  NA 135 121* 123* 123*  K 3.4* 3.5 3.1* 3.8  CL 100 86* 87* 89*  CO2 25 24 26 24   GLUCOSE 140* 178* 119* 161*  BUN 25* 13 16 21*  CREATININE 1.71* 1.36* 1.42* 1.39*  CALCIUM 8.7* 8.6* 8.5* 8.5*  MG  --  1.5*  --   --    GFR: Estimated Creatinine Clearance: 60.9 mL/min (A) (by C-G formula based on SCr of 1.39 mg/dL (H)). Liver Function Tests: Recent Labs  Lab 06/19/19 1144 06/20/19 0637  AST 24 25  ALT 17 16  ALKPHOS 63 62  BILITOT 1.4* 1.2  PROT 7.8 7.1  ALBUMIN 3.2* 2.9*   No results for input(s): LIPASE, AMYLASE in the last 168 hours. No results for input(s): AMMONIA in the last 168 hours. Coagulation Profile: No results for input(s): INR, PROTIME in the last 168 hours. Cardiac Enzymes: No results for input(s): CKTOTAL, CKMB, CKMBINDEX, TROPONINI in the last 168 hours. BNP (last 3 results) No results for input(s): PROBNP in the last 8760 hours. HbA1C: No results for input(s): HGBA1C in the last 72 hours. CBG: Recent Labs  Lab 06/13/19 1057 06/19/19 1731 06/19/19 2125 06/20/19 0727  GLUCAP 127* 212* 140* 186*   Lipid Profile: No results for input(s): CHOL, HDL, LDLCALC, TRIG, CHOLHDL, LDLDIRECT in the last 72 hours. Thyroid Function Tests: Recent Labs    06/19/19 1714  TSH 2.825   Anemia Panel: No results for input(s): VITAMINB12, FOLATE, FERRITIN, TIBC, IRON, RETICCTPCT in the last 72 hours. Sepsis Labs: No results for input(s): PROCALCITON, LATICACIDVEN in the last 168 hours.  Recent Results (from the past 240 hour(s))  SARS Coronavirus 2 (CEPHEID - Performed in [place] hospital lab), Hosp Order     Status: None   Collection Time: 06/12/19  5:08 PM   Specimen: Nasopharyngeal Swab  Result Value Ref Range Status   SARS Coronavirus 2 NEGATIVE NEGATIVE Final    Comment: (NOTE) If result is NEGATIVE SARS-CoV-2 target nucleic acids are NOT DETECTED. The SARS-CoV-2 RNA is generally  detectable in upper and lower  respiratory specimens during the acute phase of infection. The lowest  concentration of SARS-CoV-2 viral copies this assay can detect is 250  copies / mL. A negative result does not preclude SARS-CoV-2 infection  and should not be used as the sole basis for treatment or other  patient management decisions.  A negative result may occur with  improper specimen collection / handling, submission of specimen other  than nasopharyngeal swab, presence of viral mutation(s) within the  areas targeted by this assay, and inadequate number of viral copies  (<250 copies / mL). A negative result must be combined with clinical  observations, patient history, and epidemiological information. If result is POSITIVE SARS-CoV-2 target nucleic acids are DETECTED. The SARS-CoV-2 RNA is generally detectable in upper and lower  respiratory specimens dur ing the acute phase of infection.  Positive  results are indicative of active infection with SARS-CoV-2.  Clinical  correlation with patient history and other diagnostic information is  necessary to determine patient infection status.  Positive results do  not rule out bacterial infection or co-infection with other viruses. If result is PRESUMPTIVE POSTIVE SARS-CoV-2 nucleic acids MAY BE PRESENT.   A presumptive positive result was obtained on the submitted specimen  and confirmed on repeat testing.  While 2019 novel coronavirus  (SARS-CoV-2) nucleic acids may be present in the submitted sample  additional confirmatory testing may be necessary for epidemiological  and / or clinical management purposes  to differentiate between  SARS-CoV-2 and other Sarbecovirus currently known to infect humans.  If clinically indicated additional testing with an alternate test  methodology [phone]) is advised. The SARS-CoV-2 RNA is generally  detectable in upper and lower respiratory sp ecimens during the acute  phase of infection. The  expected result is Negative. Fact Sheet for Patients:  StrictlyIdeas.no Fact Sheet for Healthcare Providers: BankingDealers.co.za This test is not yet approved or cleared by the [place] FDA and has been authorized for detection and/or diagnosis of SARS-CoV-2 by FDA under an Emergency Use Authorization (EUA).  This EUA will remain in effect (meaning this test can be used) for the duration of the COVID-19 declaration under Section 564(b)(1) of the Act, 21 U.S.C. section 360bbb-3(b)(1), unless the authorization is terminated or revoked sooner. Performed at [facility], [place] [address], [place], [place] [address]   SARS Coronavirus 2 (CEPHEID - Performed in [place] hospital lab), Hosp Order     Status: None   Collection Time: 06/19/19  1:59 PM   Specimen: Nasopharyngeal Swab  Result Value Ref Range Status   SARS Coronavirus 2 NEGATIVE NEGATIVE Final    Comment: (NOTE) If result is NEGATIVE SARS-CoV-2 target nucleic acids are NOT DETECTED. The SARS-CoV-2 RNA is generally detectable in upper and lower  respiratory specimens during the acute phase of infection. The lowest  concentration of SARS-CoV-2 viral copies this assay can detect is 250  copies / mL. A negative result does not preclude SARS-CoV-2 infection  and should not be used as the sole basis for treatment or other  patient management decisions.  A negative result may occur with  improper specimen collection / handling, submission of specimen other  than nasopharyngeal swab, presence of viral mutation(s) within the  areas targeted by this assay, and inadequate number of viral copies  (<250 copies / mL). A negative result must be combined with clinical  observations, patient history, and epidemiological information. If result is POSITIVE SARS-CoV-2 target nucleic acids are DETECTED. The SARS-CoV-2 RNA is generally detectable in upper and lower  respiratory specimens  dur ing the acute phase of infection.  Positive  results are indicative of active infection with SARS-CoV-2.  Clinical  correlation with patient history and other diagnostic information is  necessary to determine patient infection status.  Positive results do  not rule out bacterial infection or co-infection with other viruses. If result is PRESUMPTIVE POSTIVE SARS-CoV-2 nucleic acids MAY BE PRESENT.   A presumptive positive result was obtained on the submitted specimen  and confirmed on repeat testing.  While 2019 novel coronavirus  (SARS-CoV-2) nucleic acids may be present in the submitted sample  additional confirmatory testing may be necessary for epidemiological  and / or clinical management purposes  to differentiate between  SARS-CoV-2 and other Sarbecovirus currently known to infect humans.  If clinically indicated additional testing with an alternate test  methodology [phone]) is advised. The SARS-CoV-2 RNA is generally  detectable in upper and lower respiratory sp ecimens during the acute  phase of infection. The expected result is Negative. Fact Sheet for Patients:  StrictlyIdeas.no Fact Sheet for Healthcare Providers: BankingDealers.co.za This test is not yet approved or cleared by the [place] FDA and has been authorized for detection and/or diagnosis of SARS-CoV-2 by FDA under an Emergency Use Authorization (EUA).  This EUA will remain in effect (meaning this test can be used) for the duration of the COVID-19 declaration under Section 564(b)(1) of the Act, 21 U.S.C. section 360bbb-3(b)(1), unless the authorization is terminated or revoked sooner. Performed at [facility], [place] [address] [place], [place] [address]          Radiology Studies: Dg Chest Port 1 View  Result Date: 06/19/2019 CLINICAL DATA:  Shortness of breath EXAM: PORTABLE CHEST 1 VIEW COMPARISON:  June 12, 2019 FINDINGS: There is airspace  consolidation in the left lower lobe with small left pleural effusion. Right lung is clear. Heart is mildly enlarged with pulmonary vascularity normal, stable. No adenopathy. No bone lesions. IMPRESSION: Lower lobe airspace consolidation felt to represent pneumonia. Small left pleural effusion. Right lung clear. Stable cardiomegaly. Electronically Signed   By: Lowella Grip III M.D.   On: 06/19/2019 12:59        Scheduled Meds:  apixaban  5 mg Oral BID   atorvastatin  40 mg Oral Daily   diltiazem  30 mg Oral BID   ferrous sulfate  325 mg Oral Q breakfast   folic acid  1 mg Oral Daily   gabapentin  600 mg Oral BID   hydrALAZINE  25 mg Oral BID   insulin aspart  0-5 Units Subcutaneous QHS   insulin aspart  0-9 Units Subcutaneous TID WC   isosorbide mononitrate  30 mg Oral Daily   loratadine  10 mg Oral Daily   multivitamin with minerals  1 tablet Oral Daily   nicotine  14 mg Transdermal Daily   pantoprazole  40 mg Oral Daily   thiamine  100 mg Oral Daily   Continuous Infusions:   LOS: 0 days     Georgette Shell, MD [facility]  If 7PM-7AM, please contact night-coverage [email or web address] Password [email or web address] 06/20/2019, 9:42 AM

## 2019-06-20 NOTE — Progress Notes (Signed)
Was the fall witnessed: Yes  Patient condition before and after the fall: condition did not change. Pt was walking back to bed from sink and stated he lost balance and got tired of standing.   Patient's reaction to the fall: no changes in patient's condition. No signs of distress.   Name of the doctor that was notified including date and time: Rodena Piety, MD  Any interventions and vital signs: BP 131/96, HR 76, O2 Sat, 100% RA, 0/10 pain.

## 2019-06-20 NOTE — Progress Notes (Signed)
Pt ambulated with RN in pt's room. RN encouraged him to ambulate frequently and sit in chair.

## 2019-06-21 ENCOUNTER — Inpatient Hospital Stay: Payer: Medicaid Other | Admitting: Family Medicine

## 2019-06-21 LAB — GLUCOSE, CAPILLARY
Glucose-Capillary: 159 mg/dL — ABNORMAL HIGH (ref 70–99)
Glucose-Capillary: 135 mg/dL — ABNORMAL HIGH (ref 70–99)
Glucose-Capillary: 163 mg/dL — ABNORMAL HIGH (ref 70–99)
Glucose-Capillary: 151 mg/dL — ABNORMAL HIGH (ref 70–99)

## 2019-06-21 LAB — BASIC METABOLIC PANEL
Anion gap: 8 (ref 5–15)
Anion gap: 7 (ref 5–15)
BUN: 20 mg/dL (ref 6–20)
BUN: 21 mg/dL — ABNORMAL HIGH (ref 6–20)
CO2: 27 mmol/L (ref 22–32)
CO2: 27 mmol/L (ref 22–32)
Calcium: 8.7 mg/dL — ABNORMAL LOW (ref 8.9–10.3)
Calcium: 8.6 mg/dL — ABNORMAL LOW (ref 8.9–10.3)
Chloride: 89 mmol/L — ABNORMAL LOW (ref 98–111)
Chloride: 91 mmol/L — ABNORMAL LOW (ref 98–111)
Creatinine, Ser: 1.29 mg/dL — ABNORMAL HIGH (ref 0.61–1.24)
Creatinine, Ser: 1.26 mg/dL — ABNORMAL HIGH (ref 0.61–1.24)
GFR calc Af Amer: >60 mL/min (ref >60)
GFR calc Af Amer: >60 mL/min (ref >60)
GFR calc non Af Amer: >60 mL/min (ref >60)
GFR calc non Af Amer: >60 mL/min (ref >60)
Glucose, Bld: 174 mg/dL — ABNORMAL HIGH (ref 70–99)
Glucose, Bld: 153 mg/dL — ABNORMAL HIGH (ref 70–99)
Potassium: 4.3 mmol/L (ref 3.5–5.1)
Potassium: 4.6 mmol/L (ref 3.5–5.1)
Sodium: 124 mmol/L — ABNORMAL LOW (ref 135–145)
Sodium: 125 mmol/L — ABNORMAL LOW (ref 135–145)

## 2019-06-21 LAB — CALCIUM, IONIZED: Calcium, Ionized, Serum: 4.5 mg/dL (ref 4.5–5.6)

## 2019-06-21 LAB — MAGNESIUM: Magnesium: 2.4 mg/dL (ref 1.7–2.4)

## 2019-06-21 MED ORDER — SODIUM CHLORIDE 0.9 % IV SOLN
INTRAVENOUS | Status: DC
  Administered 2019-06-21 – 2019-06-22 (×2): via INTRAVENOUS

## 2019-06-21 NOTE — Progress Notes (Signed)
PROGRESS NOTE    Brad [name]  KGU:[id number] DOB: Jul 31, 1961 DOA: 06/19/2019 PCP: Charlott Rakes, MD    Brief Narrative:58 y.o.malewith history h/ohepatitis C, cirrhosis, CKD stage III, paroxysmal A. fib/a flutter, recurrent admissions for combined systolic and diastolic CHF, cocaine use,chronic chest pain, GERD, hypertension who was recently admitted to this [facility] on June 67for mild CHF exacerbation and discharged with increased dose of torsemide (from 20 mg daily to 60 mg twice daily) with instructions to follow-up closely with Dr. Aundra Dubin, now presents with complaints of muscle twitching in upper and lower extremities. Patient states he filled his new prescription for torsemide on June 25 but was taking extra dose of home diuretic until then. He complains of feeling nauseous with no appetite for last 2 days with some lower abdominal discomfort. He states his 2 sons visited him yesterday after 16 years of being estranged. He ordered food from [employer] a barbecue sandwich and strawberry cheesecake milkshake. He did not eat much of the sandwich but had a milkshake. He reports 3-4 loose bowel movements yesterday and about 10 semi-formed/loose bowel movements today. He was dry heaving and feeling extremely nauseous. He came to the ED as he got concerned when he started to have muscle twitching in his extremities. Patient states his leg swellings are much better than baseline. He reports feeling dyspneic on exertion and on sleeping but states can walk more than 200 feet without shortness of breath and uses only 1 pillow to sleep. He also describes chest discomfort as chest tightness, 8/10, nonradiating which is almost always there with some worsening today. He admits to cocaine use last Wednesday. He denies any cough, fever or chills. Denies any melena, hematochezia or hematemesis. Patient appears comfortable in no respiratory distress in the ED. His vital signs are  stable and saturating 60 200% on room air. Work-up in the ED revealed WBC 3.9, hemoglobin 12.8, sodium 121 (baseline 127-132), potassium of 3.5, chloride 86, BUN 13, creatinine 1.36 (baseline 1.3-1.7), glucose 178, calcium 8.6, magnesium 1.5, BNP at 2792(was 3400 on 6/1and 1280 inlast admission). Chest x-ray reported small left pleural effusion and left lower lobe consolidation possibly pneumonia. Patient is requested to be admitted to medical service for further evaluation and management. He is receiving magnesium IV 2 g infusion  6/29 covid negative UDS positive for cocaine, complains of reproducible chest pain denies shortness of breath  Assessment & Plan:   Principal Problem:   Hyponatremia Active Problems:   GERD (gastroesophageal reflux disease)   Chronic diastolic congestive heart failure (HCC)   Polysubstance abuse (HCC)   Chronic hepatitis C with cirrhosis (HCC)   CKD (chronic kidney disease) stage 3, GFR 30-59 ml/min (HCC)   Cocaine abuse (HCC)   Atrial flutter (HCC)   Cirrhosis (HCC)   Shortness of breath   Permanent atrial fibrillation   Chronic atrial fibrillation   Hypomagnesemia   1.Acute on chronic Symptomatic hypovolemic hyponatremia: This is more likely multifactorial nutritional as well as abrupt increase in diuresis with higher dose of diuretics than that he is used to and alcohol abuse.  Patient received slow normal saline IV hydration overnight sodium improved to 124.  I will continue slow IV hydration and recheck his labs later today.  At this time he denies any nausea vomiting abdominal pain his only complaint is reproducible anterior chest wall pain.  He admits to using cocaine recently and his urine drug screen is positive for cocaine.  He does not appear to be in any volume overload  at this time he is resting flat without any difficulty with breathing.  Monitor closely with history of congestive heart failure and IV fluid ongoing. He needs to be continually  monitored in the hospital till his electrolytes are back to baseline prior to discharge.  He has hypokalemia hypomagnesemia and hyponatremia.  Needs sodium above 128 prior to dc  2.Chronic diastolic CHF with chronic chest pain/dyspnea: She has chronic chest pain which is reproducible.In the setting of ongoing cocaine use.  His high-sensitivity troponins has been 32/31/32 which is mildly elevated. Patient's recent echo on June 5 showed EF of 30 to 22%, diastolic dysfunction as well as reduced right ventricular systolic function. Patient only has trace leg edema, his BNP elevation appears to be chronic in the setting of biventricular dilatation. EKG does not show any acute ST-T changes but shows ventricular trigeminy.Counseled patient regarding fatal risks of vasospasm/MI/arrhythmias with ongoing cocaine use.   3.Nausea, vomiting, diarrhea with muscle twitching -probably related to multiple electrolyte abnormalities.  However I will hold his gabapentin as this is known to have caused muscle twitching.  He has not received any of this since admission.  Resolving  4.Paroxysmal A. fib/flutter: Resume home medicationsCardizem and chronic anticoagulation with Eliquis  5.  Hypokalemia with hypomagnesemia could be contributing to muscle twitching. resolved  6.CKD stage III: Serum creatinine appears to be at baseline (1.3-1.7)  7.Hypertension:Resume home medications. Advised patient to quit cocaine use.  8.Hepatitis C/cirrhosis: No clinical signs of fluid overload currently. Hold torsemide  9.GERD:Resume home medications  10.Diabetes mellitus:Patient on low-dose of Lantus (5 units) at home. Given variable oral intake, will watch on diabetic diet/sliding scale insulin.  11.Polysubstance abuse:Patient admits to cocaine use last Wednesday.  Urine tox screen positive for cocaine.  He continues to smoke tobacco and drink alcohol and use cocaine.  We will continue  nicotine patch and Ativan as needed.   DVT prophylaxis:On Eliquis  Code Status: Full code  patient/Family Communication: Discussed with the patient consults called:None at this time    Estimated body mass index is 24.51 kg/m as calculated from the following:   Height as of this encounter: 5\' 11"  (1.803 m).   Weight as of this encounter: 79.7 kg.   Subjective:   Sleeping no complaints lives with a friend fell yesterday no chest pain sob  Objective: Vitals:   06/20/19 2011 06/20/19 2102 06/21/19 0632 06/21/19 1018  BP: (!) 139/105 (!) 145/96 (!) 131/100 (!) 120/92  Pulse: 80  94   Resp: 17  (!) 29   Temp: 98 F (36.7 C)  (!) 97.5 F (36.4 C)   TempSrc: Oral  Oral   SpO2: 100%  99%   Weight:   79.7 kg   Height:        Intake/Output Summary (Last 24 hours) at 06/21/2019 1052 Last data filed at 06/21/2019 0152 Gross per 24 hour  Intake 1240.04 ml  Output --  Net 1240.04 ml   Filed Weights   06/19/19 1708 06/20/19 0638 06/21/19 0254  Weight: 79.1 kg 79.1 kg 79.7 kg    Examination:  General exam: Appears calm and comfortable  Respiratory system: Clear to auscultation. Respiratory effort normal. Cardiovascular system: S1 & S2 heard, RRR. No JVD, murmurs, rubs, gallops or clicks. No pedal edema. Gastrointestinal system: Abdomen is nondistended, soft and nontender. No organomegaly or masses felt. Normal bowel sounds heard. Central nervous system: Alert and oriented. No focal neurological deficits. Extremities: Symmetric 5 x 5 power. Skin: No rashes, lesions or ulcers Psychiatry:  Judgement and insight appear normal. Mood & affect appropriate.     Data Reviewed: I have personally reviewed following labs and imaging studies  CBC: Recent Labs  Lab 06/19/19 1144  WBC 3.9*  NEUTROABS 2.5  HGB 12.3*  HCT 35.2*  MCV 88.7  PLT 209   Basic Metabolic Panel: Recent Labs  Lab 06/19/19 1144 06/19/19 2017 06/20/19 0637 06/20/19 1931 06/21/19 0415  NA  121* 123* 123* 121* 124*  K 3.5 3.1* 3.8 4.4 4.3  CL 86* 87* 89* 88* 89*  CO2 24 26 24 25 27   GLUCOSE 178* 119* 161* 176* 174*  BUN 13 16 21* 21* 20  CREATININE 1.36* 1.42* 1.39* 1.28* 1.29*  CALCIUM 8.6* 8.5* 8.5* 8.4* 8.7*  MG 1.5*  --   --   --  2.4   GFR: Estimated Creatinine Clearance: 65.7 mL/min (A) (by C-G formula based on SCr of 1.29 mg/dL (H)). Liver Function Tests: Recent Labs  Lab 06/19/19 1144 06/20/19 0637  AST 24 25  ALT 17 16  ALKPHOS 63 62  BILITOT 1.4* 1.2  PROT 7.8 7.1  ALBUMIN 3.2* 2.9*   No results for input(s): LIPASE, AMYLASE in the last 168 hours. No results for input(s): AMMONIA in the last 168 hours. Coagulation Profile: No results for input(s): INR, PROTIME in the last 168 hours. Cardiac Enzymes: No results for input(s): CKTOTAL, CKMB, CKMBINDEX, TROPONINI in the last 168 hours. BNP (last 3 results) No results for input(s): PROBNP in the last 8760 hours. HbA1C: No results for input(s): HGBA1C in the last 72 hours. CBG: Recent Labs  Lab 06/20/19 0727 06/20/19 1120 06/20/19 1645 06/20/19 2326 06/21/19 0828  GLUCAP 186* 155* 137* 163* 159*   Lipid Profile: No results for input(s): CHOL, HDL, LDLCALC, TRIG, CHOLHDL, LDLDIRECT in the last 72 hours. Thyroid Function Tests: Recent Labs    06/19/19 1714  TSH 2.825   Anemia Panel: No results for input(s): VITAMINB12, FOLATE, FERRITIN, TIBC, IRON, RETICCTPCT in the last 72 hours. Sepsis Labs: No results for input(s): PROCALCITON, LATICACIDVEN in the last 168 hours.  Recent Results (from the past 240 hour(s))  SARS Coronavirus 2 (CEPHEID - Performed in [place] hospital lab), Hosp Order     Status: None   Collection Time: 06/12/19  5:08 PM   Specimen: Nasopharyngeal Swab  Result Value Ref Range Status   SARS Coronavirus 2 NEGATIVE NEGATIVE Final    Comment: (NOTE) If result is NEGATIVE SARS-CoV-2 target nucleic acids are NOT DETECTED. The SARS-CoV-2 RNA is generally detectable in  upper and lower  respiratory specimens during the acute phase of infection. The lowest  concentration of SARS-CoV-2 viral copies this assay can detect is 250  copies / mL. A negative result does not preclude SARS-CoV-2 infection  and should not be used as the sole basis for treatment or other  patient management decisions.  A negative result may occur with  improper specimen collection / handling, submission of specimen other  than nasopharyngeal swab, presence of viral mutation(s) within the  areas targeted by this assay, and inadequate number of viral copies  (<250 copies / mL). A negative result must be combined with clinical  observations, patient history, and epidemiological information. If result is POSITIVE SARS-CoV-2 target nucleic acids are DETECTED. The SARS-CoV-2 RNA is generally detectable in upper and lower  respiratory specimens dur ing the acute phase of infection.  Positive  results are indicative of active infection with SARS-CoV-2.  Clinical  correlation with patient history and other diagnostic  information is  necessary to determine patient infection status.  Positive results do  not rule out bacterial infection or co-infection with other viruses. If result is PRESUMPTIVE POSTIVE SARS-CoV-2 nucleic acids MAY BE PRESENT.   A presumptive positive result was obtained on the submitted specimen  and confirmed on repeat testing.  While 2019 novel coronavirus  (SARS-CoV-2) nucleic acids may be present in the submitted sample  additional confirmatory testing may be necessary for epidemiological  and / or clinical management purposes  to differentiate between  SARS-CoV-2 and other Sarbecovirus currently known to infect humans.  If clinically indicated additional testing with an alternate test  methodology [phone]) is advised. The SARS-CoV-2 RNA is generally  detectable in upper and lower respiratory sp ecimens during the acute  phase of infection. The expected result is  Negative. Fact Sheet for Patients:  StrictlyIdeas.no Fact Sheet for Healthcare Providers: BankingDealers.co.za This test is not yet approved or cleared by the [place] FDA and has been authorized for detection and/or diagnosis of SARS-CoV-2 by FDA under an Emergency Use Authorization (EUA).  This EUA will remain in effect (meaning this test can be used) for the duration of the COVID-19 declaration under Section 564(b)(1) of the Act, 21 U.S.C. section 360bbb-3(b)(1), unless the authorization is terminated or revoked sooner. Performed at [facility], [place] [address] [place], [place] [address]   SARS Coronavirus 2 (CEPHEID - Performed in [place] hospital lab), Hosp Order     Status: None   Collection Time: 06/19/19  1:59 PM   Specimen: Nasopharyngeal Swab  Result Value Ref Range Status   SARS Coronavirus 2 NEGATIVE NEGATIVE Final    Comment: (NOTE) If result is NEGATIVE SARS-CoV-2 target nucleic acids are NOT DETECTED. The SARS-CoV-2 RNA is generally detectable in upper and lower  respiratory specimens during the acute phase of infection. The lowest  concentration of SARS-CoV-2 viral copies this assay can detect is 250  copies / mL. A negative result does not preclude SARS-CoV-2 infection  and should not be used as the sole basis for treatment or other  patient management decisions.  A negative result may occur with  improper specimen collection / handling, submission of specimen other  than nasopharyngeal swab, presence of viral mutation(s) within the  areas targeted by this assay, and inadequate number of viral copies  (<250 copies / mL). A negative result must be combined with clinical  observations, patient history, and epidemiological information. If result is POSITIVE SARS-CoV-2 target nucleic acids are DETECTED. The SARS-CoV-2 RNA is generally detectable in upper and lower  respiratory specimens dur ing the acute  phase of infection.  Positive  results are indicative of active infection with SARS-CoV-2.  Clinical  correlation with patient history and other diagnostic information is  necessary to determine patient infection status.  Positive results do  not rule out bacterial infection or co-infection with other viruses. If result is PRESUMPTIVE POSTIVE SARS-CoV-2 nucleic acids MAY BE PRESENT.   A presumptive positive result was obtained on the submitted specimen  and confirmed on repeat testing.  While 2019 novel coronavirus  (SARS-CoV-2) nucleic acids may be present in the submitted sample  additional confirmatory testing may be necessary for epidemiological  and / or clinical management purposes  to differentiate between  SARS-CoV-2 and other Sarbecovirus currently known to infect humans.  If clinically indicated additional testing with an alternate test  methodology [phone]) is advised. The SARS-CoV-2 RNA is generally  detectable in upper and lower respiratory sp  ecimens during the acute  phase of infection. The expected result is Negative. Fact Sheet for Patients:  StrictlyIdeas.no Fact Sheet for Healthcare Providers: BankingDealers.co.za This test is not yet approved or cleared by the [place] FDA and has been authorized for detection and/or diagnosis of SARS-CoV-2 by FDA under an Emergency Use Authorization (EUA).  This EUA will remain in effect (meaning this test can be used) for the duration of the COVID-19 declaration under Section 564(b)(1) of the Act, 21 U.S.C. section 360bbb-3(b)(1), unless the authorization is terminated or revoked sooner. Performed at [facility], [place] [address], [place], [place] [address]          Radiology Studies: Dg Chest Port 1 View  Result Date: 06/19/2019 CLINICAL DATA:  Shortness of breath EXAM: PORTABLE CHEST 1 VIEW COMPARISON:  June 12, 2019 FINDINGS: There is airspace consolidation in  the left lower lobe with small left pleural effusion. Right lung is clear. Heart is mildly enlarged with pulmonary vascularity normal, stable. No adenopathy. No bone lesions. IMPRESSION: Lower lobe airspace consolidation felt to represent pneumonia. Small left pleural effusion. Right lung clear. Stable cardiomegaly. Electronically Signed   By: Lowella Grip III M.D.   On: 06/19/2019 12:59        Scheduled Meds:  apixaban  5 mg Oral BID   atorvastatin  40 mg Oral Daily   diltiazem  30 mg Oral BID   ferrous sulfate  325 mg Oral Q breakfast   folic acid  1 mg Oral Daily   hydrALAZINE  25 mg Oral BID   insulin aspart  0-5 Units Subcutaneous QHS   insulin aspart  0-9 Units Subcutaneous TID WC   isosorbide mononitrate  30 mg Oral Daily   loratadine  10 mg Oral Daily   multivitamin with minerals  1 tablet Oral Daily   nicotine  14 mg Transdermal Daily   pantoprazole  40 mg Oral Daily   thiamine  100 mg Oral Daily   Continuous Infusions:  sodium chloride 75 mL/hr at 06/21/19 1031     LOS: 1 day      Georgette Shell, MD [facility]  If 7PM-7AM, please contact night-coverage [email or web address] Password [email or web address] 06/21/2019, 10:52 AM

## 2019-06-21 NOTE — Evaluation (Signed)
Physical Therapy Evaluation Patient Details Name: Brad [name] MRN: [id number] DOB: 1961-10-23 Today's Date: 06/21/2019   History of Present Illness  58 yo admitted with twitching, CP and hyponatremia positive for cocaine. PMHx: HTN, PAFib, CHF, NICM, CKD, DM, hep C, cirrhosis, HLD, IVDU, prostate CA, noncompliance  Clinical Impression  Pt pleasant and familiar from prior admission. Pt reports walking entire unit, being on feet for an hour prior to standing at sink yesterday and falling due to fatigue per pt report. Pt with decreased balance, gait and activity tolerance this session who will benefit from acute therapy to maximize function, balance and independence for return home with friend.  HR 93-103 during session    Follow Up Recommendations Supervision - Intermittent;No PT follow up    Equipment Recommendations  None recommended by PT    Recommendations for Other Services       Precautions / Restrictions Precautions Precautions: Fall Precaution Comments: pt reports painful and unstable right ankle. fall in hospital 6/29 Restrictions Weight Bearing Restrictions: No      Mobility  Bed Mobility Overal bed mobility: Modified Independent             General bed mobility comments: supine to and from sit with bed flat and no rails. Pt declined OOB to chair end of session  Transfers Overall transfer level: Needs assistance   Transfers: Sit to/from Stand Sit to Stand: Supervision         General transfer comment: supervision for IV  Ambulation/Gait Ambulation/Gait assistance: Min guard Gait Distance (Feet): 400 Feet Assistive device: Straight cane Gait Pattern/deviations: Step-through pattern;Decreased dorsiflexion - right;Decreased stride length   Gait velocity interpretation: >2.62 ft/sec, indicative of community ambulatory General Gait Details: pt with generally steady gait with use of cane. Education for cane use and sequencing to use in left hand as pt  initiated in right hand. Guarding for safety and lines as pt with1 LOB with reentry to room with tactile/verbal cues to correct  Stairs            Wheelchair Mobility    Modified [name] (Stroke Patients Only)       Balance Overall balance assessment: Needs assistance Sitting-balance support: No upper extremity supported;Feet supported Sitting balance-Leahy Scale: Good     Standing balance support: Single extremity supported Standing balance-Leahy Scale: Good Standing balance comment: able to stand without support benefits from cane for balance                             Pertinent Vitals/Pain Pain Score: 4  Pain Location: chest pain, reports slightly sore right ankle at end of gait Pain Descriptors / Indicators: Aching Pain Intervention(s): Limited activity within patient's tolerance;Monitored during session;Repositioned    Home Living Family/patient expects to be discharged to:: Private residence Living Arrangements: Non-relatives/Friends Available Help at Discharge: Family;Available 24 hours/day Type of Home: House Home Access: Level entry     Home Layout: One level Home Equipment: Cane - single point      Prior Function Level of Independence: Independent with assistive device(s)         Comments: pt using cane for gait but does not normally walk long distances, pt reports since moving in with friend he hasn't done any cooking or housework, independent with bathing/ dressing     Hand Dominance        Extremity/Trunk Assessment   Upper Extremity Assessment Upper Extremity Assessment: Overall WFL for tasks assessed  Lower Extremity Assessment Lower Extremity Assessment: Overall WFL for tasks assessed RLE Deficits / Details: Rt ankle ROM limited    Cervical / Trunk Assessment Cervical / Trunk Assessment: Normal  Communication   Communication: No difficulties  Cognition Arousal/Alertness: Awake/alert Behavior During Therapy: WFL  for tasks assessed/performed Overall Cognitive Status: Within Functional Limits for tasks assessed                                        General Comments      Exercises     Assessment/Plan    PT Assessment Patient needs continued PT services  PT Problem List Decreased strength;Decreased activity tolerance;Decreased balance;Decreased mobility;Decreased cognition;Decreased knowledge of use of DME       PT Treatment Interventions DME instruction;Gait training;Functional mobility training;Therapeutic activities;Therapeutic exercise;Balance training;Cognitive remediation;Patient/family education    PT Goals (Current goals can be found in the Care Plan section)  Acute Rehab PT Goals Patient Stated Goal: return home PT Goal Formulation: With patient Time For Goal Achievement: 07/05/19 Potential to Achieve Goals: Good    Frequency Min 3X/week   Barriers to discharge Decreased caregiver support      Co-evaluation               AM-PAC PT "6 Clicks" Mobility  Outcome Measure Help needed turning from your back to your side while in a flat bed without using bedrails?: None Help needed moving from lying on your back to sitting on the side of a flat bed without using bedrails?: None Help needed moving to and from a bed to a chair (including a wheelchair)?: None Help needed standing up from a chair using your arms (e.g., wheelchair or bedside chair)?: A Little Help needed to walk in hospital room?: A Little Help needed climbing 3-5 steps with a railing? : A Little 6 Click Score: 21    End of Session Equipment Utilized During Treatment: Gait belt Activity Tolerance: Patient tolerated treatment well Patient left: with call bell/phone within reach;in bed;with bed alarm set Nurse Communication: Mobility status PT Visit Diagnosis: Unsteadiness on feet (R26.81);Other abnormalities of gait and mobility (R26.89);Muscle weakness (generalized) (M62.81);Difficulty in  walking, not elsewhere classified (R26.2)    Time: 6629-4765 PT Time Calculation (min) (ACUTE ONLY): 24 min   Charges:   PT Evaluation $PT Eval Moderate Complexity: 1 Mod PT Treatments $Gait Training: 8-22 mins        Georgeanne Frankland Pam Drown, PT Acute Rehabilitation Services Pager: [phone] Office: [place] 06/21/2019, 12:41 PM

## 2019-06-22 ENCOUNTER — Telehealth: Payer: Medicaid Other | Admitting: Physician Assistant

## 2019-06-22 ENCOUNTER — Inpatient Hospital Stay (HOSPITAL_COMMUNITY): Payer: Medicaid Other | Admitting: Cardiology

## 2019-06-22 DIAGNOSIS — I4892 Unspecified atrial flutter: Secondary | ICD-10-CM

## 2019-06-22 LAB — BASIC METABOLIC PANEL
Anion gap: 8 (ref 5–15)
BUN: 22 mg/dL — ABNORMAL HIGH (ref 6–20)
CO2: 22 mmol/L (ref 22–32)
Calcium: 8.7 mg/dL — ABNORMAL LOW (ref 8.9–10.3)
Chloride: 92 mmol/L — ABNORMAL LOW (ref 98–111)
Creatinine, Ser: 1.2 mg/dL (ref 0.61–1.24)
GFR calc Af Amer: >60 mL/min (ref >60)
GFR calc non Af Amer: >60 mL/min (ref >60)
Glucose, Bld: 150 mg/dL — ABNORMAL HIGH (ref 70–99)
Potassium: 4.5 mmol/L (ref 3.5–5.1)
Sodium: 122 mmol/L — ABNORMAL LOW (ref 135–145)

## 2019-06-22 LAB — GLUCOSE, CAPILLARY: Glucose-Capillary: 139 mg/dL — ABNORMAL HIGH (ref 70–99)

## 2019-06-22 MED ORDER — GABAPENTIN 300 MG PO CAPS: 300.0000 mg | ORAL_CAPSULE | Freq: Two times a day (BID) | ORAL | 0 refills | Status: DC

## 2019-06-22 MED ORDER — TORSEMIDE 20 MG PO TABS: 40.0000 mg | ORAL_TABLET | Freq: Two times a day (BID) | ORAL | 1 refills | Status: DC

## 2019-06-22 NOTE — Progress Notes (Signed)
Patient states he wants to leave the hospital against medical advice. Dr. Zigmund Daniel notified via phone. AMA paper provided to patient; patient signed and left hospital.

## 2019-06-23 ENCOUNTER — Telehealth: Payer: Self-pay

## 2019-06-23 NOTE — Telephone Encounter (Signed)
Transition Care Management Follow-up Telephone Call Date of discharge and from where: 06/22/2019, [facility]  Left AMA  Call placed to # [phone], message left requesting a call bacak to thsi CM # [phone]

## 2019-06-24 NOTE — Discharge Summary (Signed)
Physician Discharge Summary  Brad [name] BWL:[id number] DOB: June 05, 1961 DOA: 06/19/2019  PCP: Charlott Rakes, MD  Admit date: 06/19/2019 Discharge date: 06/24/2019  Admitted From: Home Disposition: patient left ama   Discharge Condition: Unstable CODE STATUS: Full code  Brief/Interim Summary:58 y.o.malewith history h/ohepatitis C, cirrhosis, CKD stage III, paroxysmal A. fib/a flutter, recurrent admissions for combined systolic and diastolic CHF, cocaine use,chronic chest pain, GERD, hypertension who was recently [facility] to this [facility] on June 33fr mild CHF exacerbation and discharged with increased dose of torsemide (from 20 mg daily to 60 mg twice daily) with instructions to follow-up closely with Dr. MAundra Dubin now presents with complaints of muscle twitching in upper and lower extremities. Patient states he filled his new prescription for torsemide on June 25 but was taking extra dose of home diuretic until then. He complains of feeling nauseous with no appetite for last 2 days with some lower abdominal discomfort. He states his 2 sons visited him yesterday after 16 years of being estranged. He ordered food from [employer] a barbecue sandwich and strawberry cheesecake milkshake. He did not eat much of the sandwich but had a milkshake. He reports 3-4 loose bowel movements yesterday and about 10 semi-formed/loose bowel movements today. He was dry heaving and feeling extremely nauseous. He came to the ED as he got concerned when he started to have muscle twitching in his extremities. Patient states his leg swellings are much better than baseline. He reports feeling dyspneic on exertion and on sleeping but states can walk more than 200 feet without shortness of breath and uses only 1 pillow to sleep. He also describes chest discomfort as chest tightness, 8/10, nonradiating which is almost always there with some worsening today. He admits to cocaine use last Wednesday. He  denies any cough, fever or chills. Denies any melena, hematochezia or hematemesis. Patient appears comfortable in no respiratory distress in the ED. His vital signs are stable and saturating 98 200% on room air. Work-up in the ED revealed WBC 3.9, hemoglobin 12.8, sodium 121 (baseline 127-132), potassium of 3.5, chloride 86, BUN 13, creatinine 1.36 (baseline 1.3-1.7), glucose 178, calcium 8.6, magnesium 1.5, BNP at 2792(was 3400 on 6/1and 1280 inlast admission). Chest x-ray reported small left pleural effusion and left lower lobe consolidation possibly pneumonia. Patient is requested to be admitted to medical service for further evaluation and management. He is receiving magnesium IV 2 g infusion  6/29 covid negative UDS positive for cocaine,complains of reproducible chest pain denies shortness of breath  Discharge Diagnoses:  Principal Problem:   Hyponatremia Active Problems:   GERD (gastroesophageal reflux disease)   Chronic diastolic congestive heart failure (HCC)   Polysubstance abuse (HCC)   Chronic hepatitis C with cirrhosis (HCC)   CKD (chronic kidney disease) stage 3, GFR 30-59 ml/min (HCC)   Cocaine abuse (HCC)   Atrial flutter (HCC)   Cirrhosis (HCC)   Shortness of breath   Permanent atrial fibrillation   Chronic atrial fibrillation   Hypomagnesemia  1.Acute on chronicSymptomatichypovolemichyponatremia:This is more likely multifactorial nutritional as well as abrupt increase in diuresis with higher dose of diuretics than that he is used to and alcohol abuse. Patient received slow normal saline IV hydration overnight sodium still remained low at 122. Patient left AGAINST MEDICAL ADVICE even after he was told his condition is unstable and he could die and can get seizures without further treatment.  2.Chronic diastolic CHF with chronic chest pain/dyspnea:She has chronic chest pain which is reproducible.In the setting of ongoing cocaine use.His  high-sensitivity troponins has been 32/31/32 which is mildlyelevated. Patient's recent echo on June 5 showed EF of 30 to 40%, diastolic dysfunction as well as reduced right ventricular systolic function. Patient only has trace leg edema, his BNP elevation appears to be chronic in the setting of biventricular dilatation. EKG does not show any acute ST-T changes but shows ventricular trigeminy.Counseled patient regarding fatal risks of vasospasm/MI/arrhythmias with ongoing cocaine use.   3.Nausea, vomiting, diarrheawith muscle twitching-probably related to multiple electrolyte abnormalities. However I will hold his gabapentin as this is known to have caused muscle twitching. He has not received any of this since admission. Resolving  4.Paroxysmal A. fib/flutter: Resume home medicationsCardizem and chronic anticoagulation with Eliquis  5.Hypokalemia with hypomagnesemia could be contributing to muscle twitching. resolved  6.CKD stage III: Serum creatinine appears to be at baseline (1.3-1.7)  7.Hypertension:Resume home medications. Advised patient to quit cocaine use.  8.Hepatitis C/cirrhosis: No clinical signs of fluid overload currently. Hold torsemide  9.GERD:Resume home medications  10.Diabetes mellitus:Patient on low-dose of Lantus (5 units) at home. Given variable oral intake, will watch on diabetic diet/sliding scale insulin.  11.Polysubstance abuse:Patient admits to cocaine use last Wednesday.Urine tox screen positive for cocaine. He continues to smoke tobacco and drink alcohol and use cocaine. We will continue nicotine patch and Ativan as  Estimated body mass index is 25.64 kg/m as calculated from the following:   Height as of this encounter: '5\' 11"'  (1.803 m).   Weight as of this encounter: 83.4 kg.  Discharge Instructions   Allergies as of 06/22/2019      Reactions   Angiotensin Receptor Blockers Anaphylaxis, Other (See Comments)    (Angioedema also with Lisinopril, therefore ARB's are contraindicated)   Lisinopril Anaphylaxis, Other (See Comments)   Throat swells   Pamelor [nortriptyline Hcl] Anaphylaxis, Swelling   Throat swells      Medication List    STOP taking these medications   Lantus SoloStar 100 UNIT/ML Solostar Pen Generic drug: Insulin Glargine     TAKE these medications   Accu-Chek FastClix Lancets Misc Use as directed three times daily   Accu-Chek Guide w/Device Kit 1 each by Does not apply route 3 (three) times daily.   apixaban 5 MG Tabs tablet Commonly known as: ELIQUIS Take 1 tablet (5 mg total) by mouth 2 (two) times daily.   atorvastatin 40 MG tablet Commonly known as: LIPITOR Take 1 tablet (40 mg total) by mouth daily. What changed: when to take this   cetirizine 10 MG tablet Commonly known as: ZYRTEC TAKE 1 TABLET (10 MG TOTAL) BY MOUTH DAILY.   diltiazem 30 MG tablet Commonly known as: CARDIZEM Take 1 tablet (30 mg total) by mouth 2 (two) times daily.   Easy Comfort Pen Needles 31G X 8 MM Misc Generic drug: Insulin Pen Needle USE AS DIRECTED TWICE A DAY   ferrous sulfate 325 (65 FE) MG tablet Take 1 tablet (325 mg total) by mouth daily with breakfast.   folic acid 1 MG tablet Commonly known as: FOLVITE Take 1 tablet (1 mg total) by mouth daily.   gabapentin 300 MG capsule Commonly known as: NEURONTIN Take 1 capsule (300 mg total) by mouth 2 (two) times daily. What changed: how much to take   glucose blood test strip Commonly known as: Accu-Chek Guide Use as directed three times daily   hydrALAZINE 25 MG tablet Commonly known as: APRESOLINE Take 1 tablet (25 mg total) by mouth 2 (two) times a day.   isosorbide mononitrate 30  MG 24 hr tablet Commonly known as: IMDUR Take 1 tablet (30 mg total) by mouth daily.   multivitamin with minerals Tabs tablet Take 1 tablet by mouth daily.   nicotine 14 mg/24hr patch Commonly known as: NICODERM CQ - dosed in mg/24  hours Place 1 patch (14 mg total) onto the skin daily.   oxyCODONE 5 MG immediate release tablet Commonly known as: Roxicodone Take 1 tablet (5 mg total) by mouth 2 (two) times daily as needed for severe pain.   pantoprazole 40 MG tablet Commonly known as: PROTONIX Take 1 tablet (40 mg total) by mouth daily.   polyethylene glycol 17 g packet Commonly known as: MIRALAX / GLYCOLAX Take 17 g by mouth daily as needed. What changed: reasons to take this   thiamine 100 MG tablet Take 1 tablet (100 mg total) by mouth daily.   torsemide 20 MG tablet Commonly known as: DEMADEX Take 2 tablets (40 mg total) by mouth 2 (two) times daily. What changed: how much to take      Follow-up Information    Charlott Rakes, MD Follow up.   Specialty: Family Medicine Contact information: [place] [address] [phone]        [name], MD .   Specialty: Cardiology Contact information: [address] [place] [address] [phone]          Allergies  Allergen Reactions  . Angiotensin Receptor Blockers Anaphylaxis and Other (See Comments)    (Angioedema also with Lisinopril, therefore ARB's are contraindicated)  . Lisinopril Anaphylaxis and Other (See Comments)    Throat swells  . Pamelor [Nortriptyline Hcl] Anaphylaxis and Swelling    Throat swells    Consultations: None  Procedures/Studies: Dg Chest 2 View  Result Date: 06/06/2019 CLINICAL DATA:  Chest pain EXAM: CHEST - 2 VIEW COMPARISON:  CT dated 05/27/2019.  Chest x-ray dated 05/23/2019. FINDINGS: Again noted is cardiomegaly. There are small bilateral pleural effusions. There is evidence of developing pulmonary edema. There are few scattered airspace opacities bilaterally which are favored to be secondary to developing pulmonary edema. There is no pneumothorax. There is no acute osseous abnormality. IMPRESSION: Overall findings consistent with congestive heart failure with  developing pulmonary edema including small bilateral pleural effusions. There are few scattered bilateral airspace opacities which are favored to represent atelectasis or sequela of pulmonary edema. An atypical infectious process can have a similar appearance in the appropriate clinical setting. Electronically Signed   By: Constance Holster M.D.   On: 06/06/2019 17:08   Ct Head Wo Contrast  Result Date: 06/06/2019 CLINICAL DATA:  Altered level of consciousness (LOC), unexplained; C-spine trauma, high clinical risk (NEXUS/CCR) EXAM: CT HEAD WITHOUT CONTRAST CT CERVICAL SPINE WITHOUT CONTRAST TECHNIQUE: Multidetector CT imaging of the head and cervical spine was performed following the standard protocol without intravenous contrast. Multiplanar CT image reconstructions of the cervical spine were also generated. COMPARISON:  Head and cervical spine CT 08/16/2018 FINDINGS: Mild motion artifact in the head and cervical spine. CT HEAD FINDINGS Brain: Similar degree of atrophy and chronic small vessel ischemia from prior. No intracranial hemorrhage, mass effect, or midline shift. No hydrocephalus. The basilar cisterns are patent. No evidence of territorial infarct or acute ischemia. No extra-axial or intracranial fluid collection. Vascular: Atherosclerosis of skullbase vasculature without hyperdense vessel or abnormal calcification. Skull: No fracture or focal lesion. Sinuses/Orbits: Paranasal sinuses and mastoid air cells are clear. The visualized orbits are unremarkable. Other: None. CT CERVICAL SPINE FINDINGS  Alignment: No traumatic subluxation. Reversal of normal cervical lordosis. Skull base and vertebrae: No acute fracture, detailed evaluation slightly limited by motion. Vertebral body heights are maintained. The dens and skull base are intact, skull base better assessed on concurrent head CT. Soft tissues and spinal canal: No prevertebral fluid or swelling. No visible canal hematoma. Disc levels: Multilevel  disc space narrowing and endplate spurring most prominent at C5-C6. scattered facet arthropathy. Upper chest: Bilateral and pleural effusions and pulmonary edema, concordant with chest radiograph earlier this day. Other: Carotid atherosclerosis. IMPRESSION: 1. No acute intracranial abnormality. No skull fracture. Stable atrophy and chronic small vessel ischemia. 2. Multilevel degenerative change in the cervical spine without acute fracture or subluxation, allowing for mild motion artifact. 3. Carotid and skullbase atherosclerosis. Electronically Signed   By: Keith Rake M.D.   On: 06/06/2019 19:13   Ct Chest Wo Contrast  Result Date: 05/27/2019 CLINICAL DATA:  Shortness of breath, CHF exacerbation, cirrhosis EXAM: CT CHEST WITHOUT CONTRAST TECHNIQUE: Multidetector CT imaging of the chest was performed following the standard protocol without IV contrast. COMPARISON:  08/16/2018 FINDINGS: Cardiovascular: Aortic atherosclerosis. Cardiomegaly. Three-vessel coronary artery calcifications. No pericardial effusion. Mediastinum/Nodes: No enlarged mediastinal, hilar, or axillary lymph nodes. Thyroid gland, trachea, and esophagus demonstrate no significant findings. Lungs/Pleura: Mild centrilobular emphysema. Small bilateral pleural effusions and associated atelectasis or consolidation. Mild interlobular septal thickening. Upper Abdomen: No acute abnormality.  Nodular contour of the liver. Musculoskeletal: No chest wall mass or suspicious bone lesions identified. IMPRESSION: 1. Small bilateral pleural effusions and associated atelectasis or consolidation. Mild interlobular septal thickening. Findings are consistent with mild edema. 2.  Cardiomegaly and coronary artery disease. 3.  Mild emphysema. Electronically Signed   By: Eddie Candle M.D.   On: 05/27/2019 17:34   Ct Cervical Spine Wo Contrast  Result Date: 06/06/2019 CLINICAL DATA:  Altered level of consciousness (LOC), unexplained; C-spine trauma, high  clinical risk (NEXUS/CCR) EXAM: CT HEAD WITHOUT CONTRAST CT CERVICAL SPINE WITHOUT CONTRAST TECHNIQUE: Multidetector CT imaging of the head and cervical spine was performed following the standard protocol without intravenous contrast. Multiplanar CT image reconstructions of the cervical spine were also generated. COMPARISON:  Head and cervical spine CT 08/16/2018 FINDINGS: Mild motion artifact in the head and cervical spine. CT HEAD FINDINGS Brain: Similar degree of atrophy and chronic small vessel ischemia from prior. No intracranial hemorrhage, mass effect, or midline shift. No hydrocephalus. The basilar cisterns are patent. No evidence of territorial infarct or acute ischemia. No extra-axial or intracranial fluid collection. Vascular: Atherosclerosis of skullbase vasculature without hyperdense vessel or abnormal calcification. Skull: No fracture or focal lesion. Sinuses/Orbits: Paranasal sinuses and mastoid air cells are clear. The visualized orbits are unremarkable. Other: None. CT CERVICAL SPINE FINDINGS Alignment: No traumatic subluxation. Reversal of normal cervical lordosis. Skull base and vertebrae: No acute fracture, detailed evaluation slightly limited by motion. Vertebral body heights are maintained. The dens and skull base are intact, skull base better assessed on concurrent head CT. Soft tissues and spinal canal: No prevertebral fluid or swelling. No visible canal hematoma. Disc levels: Multilevel disc space narrowing and endplate spurring most prominent at C5-C6. scattered facet arthropathy. Upper chest: Bilateral and pleural effusions and pulmonary edema, concordant with chest radiograph earlier this day. Other: Carotid atherosclerosis. IMPRESSION: 1. No acute intracranial abnormality. No skull fracture. Stable atrophy and chronic small vessel ischemia. 2. Multilevel degenerative change in the cervical spine without acute fracture or subluxation, allowing for mild motion artifact. 3. Carotid and  skullbase atherosclerosis. Electronically  Signed   By: Keith Rake M.D.   On: 06/06/2019 19:13   Dg Chest Port 1 View  Result Date: 06/19/2019 CLINICAL DATA:  Shortness of breath EXAM: PORTABLE CHEST 1 VIEW COMPARISON:  June 12, 2019 FINDINGS: There is airspace consolidation in the left lower lobe with small left pleural effusion. Right lung is clear. Heart is mildly enlarged with pulmonary vascularity normal, stable. No adenopathy. No bone lesions. IMPRESSION: Lower lobe airspace consolidation felt to represent pneumonia. Small left pleural effusion. Right lung clear. Stable cardiomegaly. Electronically Signed   By: Lowella Grip III M.D.   On: 06/19/2019 12:59   Dg Chest Portable 1 View  Result Date: 06/12/2019 CLINICAL DATA:  Chest pain and shortness of breath. EXAM: PORTABLE CHEST 1 VIEW COMPARISON:  06/06/2019 FINDINGS: Cardiomediastinal silhouette is enlarged. Probable mild interstitial pulmonary edema and small pleural effusions. Osseous structures are without acute abnormality. Soft tissues are grossly normal. IMPRESSION: 1. Enlarged cardiac silhouette. 2. Probable mild interstitial pulmonary edema and small pleural effusions. Electronically Signed   By: Fidela Salisbury M.D.   On: 06/12/2019 16:06    (Echo, Carotid, EGD, Colonoscopy, ERCP)    Subjective: Anxious to be going home today though I told him he is not medically stable to be discharged he says he feels fine he has no nausea vomiting he can walk  Discharge Exam: Vitals:   06/22/19 0316 06/22/19 0833  BP: (!) 143/101 (!) 145/109  Pulse: 95   Resp: (!) 23   Temp: 97.6 F (36.4 C)   SpO2: 97%    Vitals:   06/21/19 2042 06/21/19 2334 06/22/19 0316 06/22/19 0833  BP: (!) 144/104  (!) 143/101 (!) 145/109  Pulse: 91  95   Resp: (!) 23 (!) 25 (!) 23   Temp: 98 F (36.7 C)  97.6 F (36.4 C)   TempSrc: Oral  Oral   SpO2: 100%  97%   Weight:   83.4 kg   Height:        General: Pt is alert, awake, not in  acute distress Cardiovascular: RRR, S1/S2 +, no rubs, no gallops Respiratory: CTA bilaterally, no wheezing, no rhonchi Abdominal: Soft, NT, ND, bowel sounds + Extremities: no edema, no cyanosis    The results of significant diagnostics from this hospitalization (including imaging, microbiology, ancillary and laboratory) are listed below for reference.     Microbiology: Recent Results (from the past 240 hour(s))  SARS Coronavirus 2 (CEPHEID - Performed in [place] hospital lab), Hosp Order     Status: None   Collection Time: 06/19/19  1:59 PM   Specimen: Nasopharyngeal Swab  Result Value Ref Range Status   SARS Coronavirus 2 NEGATIVE NEGATIVE Final    Comment: (NOTE) If result is NEGATIVE SARS-CoV-2 target nucleic acids are NOT DETECTED. The SARS-CoV-2 RNA is generally detectable in upper and lower  respiratory specimens during the acute phase of infection. The lowest  concentration of SARS-CoV-2 viral copies this assay can detect is 250  copies / mL. A negative result does not preclude SARS-CoV-2 infection  and should not be used as the sole basis for treatment or other  patient management decisions.  A negative result may occur with  improper specimen collection / handling, submission of specimen other  than nasopharyngeal swab, presence of viral mutation(s) within the  areas targeted by this assay, and inadequate number of viral copies  (<250 copies / mL). A negative result must be combined with clinical  observations, patient history, and epidemiological information.  If result is POSITIVE SARS-CoV-2 target nucleic acids are DETECTED. The SARS-CoV-2 RNA is generally detectable in upper and lower  respiratory specimens dur ing the acute phase of infection.  Positive  results are indicative of active infection with SARS-CoV-2.  Clinical  correlation with patient history and other diagnostic information is  necessary to determine patient infection status.  Positive results do   not rule out bacterial infection or co-infection with other viruses. If result is PRESUMPTIVE POSTIVE SARS-CoV-2 nucleic acids MAY BE PRESENT.   A presumptive positive result was obtained on the submitted specimen  and confirmed on repeat testing.  While 2019 novel coronavirus  (SARS-CoV-2) nucleic acids may be present in the submitted sample  additional confirmatory testing may be necessary for epidemiological  and / or clinical management purposes  to differentiate between  SARS-CoV-2 and other Sarbecovirus currently known to infect humans.  If clinically indicated additional testing with an alternate test  methodology [phone]) is advised. The SARS-CoV-2 RNA is generally  detectable in upper and lower respiratory sp ecimens during the acute  phase of infection. The expected result is Negative. Fact Sheet for Patients:  StrictlyIdeas.no Fact Sheet for Healthcare Providers: BankingDealers.co.za This test is not yet approved or cleared by the [place] FDA and has been authorized for detection and/or diagnosis of SARS-CoV-2 by FDA under an Emergency Use Authorization (EUA).  This EUA will remain in effect (meaning this test can be used) for the duration of the COVID-19 declaration under Section 564(b)(1) of the Act, 21 U.S.C. section 360bbb-3(b)(1), unless the authorization is terminated or revoked sooner. Performed at [facility], [place] [address], [place], [place] [address]      Labs: BNP (last 3 results) Recent Labs    06/06/19 1752 06/12/19 1520 06/19/19 1144  BNP 1,314.8* 1,280.2* 9,381.0*   Basic Metabolic Panel: Recent Labs  Lab 06/19/19 1144  06/20/19 0637 06/20/19 1931 06/21/19 0415 06/21/19 1920 06/22/19 0540  NA 121*   < > 123* 121* 124* 125* 122*  K 3.5   < > 3.8 4.4 4.3 4.6 4.5  CL 86*   < > 89* 88* 89* 91* 92*  CO2 24   < > '24 25 27 27 22  ' GLUCOSE 178*   < > 161* 176* 174* 153* 150*  BUN 13   <  > 21* 21* 20 21* 22*  CREATININE 1.36*   < > 1.39* 1.28* 1.29* 1.26* 1.20  CALCIUM 8.6*   < > 8.5* 8.4* 8.7* 8.6* 8.7*  MG 1.5*  --   --   --  2.4  --   --    < > = values in this interval not displayed.   Liver Function Tests: Recent Labs  Lab 06/19/19 1144 06/20/19 0637  AST 24 25  ALT 17 16  ALKPHOS 63 62  BILITOT 1.4* 1.2  PROT 7.8 7.1  ALBUMIN 3.2* 2.9*   No results for input(s): LIPASE, AMYLASE in the last 168 hours. No results for input(s): AMMONIA in the last 168 hours. CBC: Recent Labs  Lab 06/19/19 1144  WBC 3.9*  NEUTROABS 2.5  HGB 12.3*  HCT 35.2*  MCV 88.7  PLT 202   Cardiac Enzymes: No results for input(s): CKTOTAL, CKMB, CKMBINDEX, TROPONINI in the last 168 hours. BNP: Invalid input(s): POCBNP CBG: Recent Labs  Lab 06/21/19 0828 06/21/19 1256 06/21/19 1719 06/21/19 2115 06/22/19 0734  GLUCAP 159* 135* 163* 151* 139*   D-Dimer No results for input(s): DDIMER in the last  72 hours. Hgb A1c No results for input(s): HGBA1C in the last 72 hours. Lipid Profile No results for input(s): CHOL, HDL, LDLCALC, TRIG, CHOLHDL, LDLDIRECT in the last 72 hours. Thyroid function studies No results for input(s): TSH, T4TOTAL, T3FREE, THYROIDAB in the last 72 hours.  Invalid input(s): FREET3 Anemia work up No results for input(s): VITAMINB12, FOLATE, FERRITIN, TIBC, IRON, RETICCTPCT in the last 72 hours. Urinalysis    Component Value Date/Time   COLORURINE YELLOW 06/19/2019 1739   APPEARANCEUR CLEAR 06/19/2019 1739   LABSPEC 1.006 06/19/2019 1739   PHURINE 5.0 06/19/2019 1739   GLUCOSEU NEGATIVE 06/19/2019 1739   HGBUR SMALL (A) 06/19/2019 1739   BILIRUBINUR NEGATIVE 06/19/2019 1739   BILIRUBINUR neg 05/08/2017 1503   KETONESUR NEGATIVE 06/19/2019 1739   PROTEINUR 100 (A) 06/19/2019 1739   UROBILINOGEN 0.2 05/08/2017 1503   UROBILINOGEN 1.0 04/26/2012 1328   NITRITE NEGATIVE 06/19/2019 1739   LEUKOCYTESUR NEGATIVE 06/19/2019 1739   Sepsis  Labs Invalid input(s): PROCALCITONIN,  WBC,  LACTICIDVEN Microbiology Recent Results (from the past 240 hour(s))  SARS Coronavirus 2 (CEPHEID - Performed in [place] hospital lab), Hosp Order     Status: None   Collection Time: 06/19/19  1:59 PM   Specimen: Nasopharyngeal Swab  Result Value Ref Range Status   SARS Coronavirus 2 NEGATIVE NEGATIVE Final    Comment: (NOTE) If result is NEGATIVE SARS-CoV-2 target nucleic acids are NOT DETECTED. The SARS-CoV-2 RNA is generally detectable in upper and lower  respiratory specimens during the acute phase of infection. The lowest  concentration of SARS-CoV-2 viral copies this assay can detect is 250  copies / mL. A negative result does not preclude SARS-CoV-2 infection  and should not be used as the sole basis for treatment or other  patient management decisions.  A negative result may occur with  improper specimen collection / handling, submission of specimen other  than nasopharyngeal swab, presence of viral mutation(s) within the  areas targeted by this assay, and inadequate number of viral copies  (<250 copies / mL). A negative result must be combined with clinical  observations, patient history, and epidemiological information. If result is POSITIVE SARS-CoV-2 target nucleic acids are DETECTED. The SARS-CoV-2 RNA is generally detectable in upper and lower  respiratory specimens dur ing the acute phase of infection.  Positive  results are indicative of active infection with SARS-CoV-2.  Clinical  correlation with patient history and other diagnostic information is  necessary to determine patient infection status.  Positive results do  not rule out bacterial infection or co-infection with other viruses. If result is PRESUMPTIVE POSTIVE SARS-CoV-2 nucleic acids MAY BE PRESENT.   A presumptive positive result was obtained on the submitted specimen  and confirmed on repeat testing.  While 2019 novel coronavirus  (SARS-CoV-2) nucleic  acids may be present in the submitted sample  additional confirmatory testing may be necessary for epidemiological  and / or clinical management purposes  to differentiate between  SARS-CoV-2 and other Sarbecovirus currently known to infect humans.  If clinically indicated additional testing with an alternate test  methodology [id number]) is advised. The SARS-CoV-2 RNA is generally  detectable in upper and lower respiratory sp ecimens during the acute  phase of infection. The expected result is Negative. Fact Sheet for Patients:  StrictlyIdeas.no Fact Sheet for Healthcare Providers: BankingDealers.co.za This test is not yet approved or cleared by the [place] FDA and has been authorized for detection and/or diagnosis of SARS-CoV-2 by FDA under an Emergency Use  Authorization (EUA).  This EUA will remain in effect (meaning this test can be used) for the duration of the COVID-19 declaration under Section 564(b)(1) of the Act, 21 U.S.C. section 360bbb-3(b)(1), unless the authorization is terminated or revoked sooner. Performed at [facility], [place] [address], [place], [place] [address]      Time coordinating discharge:  34 minutes  SIGNED:   Georgette Shell, MD  [facility] 06/24/2019, 11:52 AM Pager   If 7PM-7AM, please contact night-coverage [email or web address] Password [email or web address]

## 2019-06-27 ENCOUNTER — Other Ambulatory Visit: Payer: Self-pay

## 2019-06-27 ENCOUNTER — Telehealth: Payer: Self-pay

## 2019-06-27 ENCOUNTER — Other Ambulatory Visit (HOSPITAL_COMMUNITY): Payer: Self-pay

## 2019-06-27 ENCOUNTER — Telehealth (HOSPITAL_COMMUNITY): Payer: Self-pay

## 2019-06-27 DIAGNOSIS — E118 Type 2 diabetes mellitus with unspecified complications: Secondary | ICD-10-CM
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
## 2019-06-27 NOTE — Telephone Encounter (Signed)
Left message for Cameryn to return my call for scheduling home visit.

## 2019-06-27 NOTE — Telephone Encounter (Signed)
Call received from [name], EMT.  She reported that the patient only has torsemide and gabapentin.  He had to leave his prior residence and was not able to take his medications with him when he left.  She explained that he is not eligible to have some of the missing medications filled because they were refilled recently and are not eligible for refills.  Informed her that assistance would be requested from Feliz Beam, [place].   Nira Conn has spoken to the patient at length about returning to his prior residence to retrieve the medications if they are still there and he was very resistant to the suggestiont. She offered to contact [place] for assistance and to accompany him to the residence and he refused.
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
## 2019-06-28 MED ORDER — FERROUS SULFATE 325 (65 FE) MG PO TABS: 325.0000 mg | ORAL_TABLET | Freq: Every day | ORAL | 0 refills | Status: DC

## 2019-06-28 MED ORDER — FOLIC ACID 1 MG PO TABS: 1.0000 mg | ORAL_TABLET | Freq: Every day | ORAL | 0 refills | Status: DC

## 2019-06-28 MED ORDER — DILTIAZEM HCL 30 MG PO TABS: 30.0000 mg | ORAL_TABLET | Freq: Two times a day (BID) | ORAL | 0 refills | Status: DC

## 2019-06-28 MED ORDER — ATORVASTATIN CALCIUM 40 MG PO TABS: 40.0000 mg | ORAL_TABLET | Freq: Every day | ORAL | 3 refills | Status: DC

## 2019-06-28 MED ORDER — PANTOPRAZOLE SODIUM 40 MG PO TBEC: 40.0000 mg | DELAYED_RELEASE_TABLET | Freq: Every day | ORAL | 0 refills | Status: DC

## 2019-06-28 MED ORDER — ISOSORBIDE MONONITRATE ER 30 MG PO TB24: 30.0000 mg | ORAL_TABLET | Freq: Every day | ORAL | 0 refills | Status: DC

## 2019-06-29 ENCOUNTER — Other Ambulatory Visit (HOSPITAL_COMMUNITY): Payer: Self-pay | Admitting: Cardiology

## 2019-06-29 ENCOUNTER — Other Ambulatory Visit: Payer: Self-pay | Admitting: Family Medicine

## 2019-06-29 DIAGNOSIS — E118 Type 2 diabetes mellitus with unspecified complications: Secondary | ICD-10-CM
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
## 2019-07-04 ENCOUNTER — Telehealth: Payer: Self-pay

## 2019-07-04 ENCOUNTER — Encounter: Payer: Self-pay | Admitting: Family Medicine

## 2019-07-04 ENCOUNTER — Ambulatory Visit: Payer: Medicaid Other | Attending: Family Medicine | Admitting: Family Medicine

## 2019-07-04 ENCOUNTER — Other Ambulatory Visit: Payer: Self-pay

## 2019-07-04 VITALS — BP 131/84 | HR 98 | Temp 98.4°F | Ht 71.0 in | Wt 180.0 lb

## 2019-07-04 DIAGNOSIS — G4709 Other insomnia: Secondary | ICD-10-CM

## 2019-07-04 DIAGNOSIS — Z8249 Family history of ischemic heart disease and other diseases of the circulatory system: Secondary | ICD-10-CM | POA: Insufficient documentation

## 2019-07-04 DIAGNOSIS — I428 Other cardiomyopathies: Secondary | ICD-10-CM | POA: Diagnosis not present

## 2019-07-04 DIAGNOSIS — E1122 Type 2 diabetes mellitus with diabetic chronic kidney disease: Secondary | ICD-10-CM | POA: Insufficient documentation

## 2019-07-04 DIAGNOSIS — Z1211 Encounter for screening for malignant neoplasm of colon: Secondary | ICD-10-CM | POA: Diagnosis not present

## 2019-07-04 DIAGNOSIS — N183 Chronic kidney disease, stage 3 (moderate): Secondary | ICD-10-CM | POA: Diagnosis not present

## 2019-07-04 DIAGNOSIS — R251 Tremor, unspecified: Secondary | ICD-10-CM

## 2019-07-04 DIAGNOSIS — I5023 Acute on chronic systolic (congestive) heart failure: Secondary | ICD-10-CM

## 2019-07-04 DIAGNOSIS — K7460 Unspecified cirrhosis of liver: Secondary | ICD-10-CM | POA: Insufficient documentation

## 2019-07-04 DIAGNOSIS — B1920 Unspecified viral hepatitis C without hepatic coma: Secondary | ICD-10-CM | POA: Insufficient documentation

## 2019-07-04 DIAGNOSIS — I130 Hypertensive heart and chronic kidney disease with heart failure and stage 1 through stage 4 chronic kidney disease, or unspecified chronic kidney disease: Secondary | ICD-10-CM | POA: Diagnosis not present

## 2019-07-04 DIAGNOSIS — I4892 Unspecified atrial flutter: Secondary | ICD-10-CM

## 2019-07-04 DIAGNOSIS — Z7901 Long term (current) use of anticoagulants: Secondary | ICD-10-CM | POA: Diagnosis not present

## 2019-07-04 DIAGNOSIS — Z79899 Other long term (current) drug therapy: Secondary | ICD-10-CM | POA: Diagnosis not present

## 2019-07-04 DIAGNOSIS — K219 Gastro-esophageal reflux disease without esophagitis: Secondary | ICD-10-CM | POA: Diagnosis not present

## 2019-07-04 DIAGNOSIS — R1030 Lower abdominal pain, unspecified: Secondary | ICD-10-CM | POA: Insufficient documentation

## 2019-07-04 DIAGNOSIS — Z794 Long term (current) use of insulin: Secondary | ICD-10-CM | POA: Insufficient documentation

## 2019-07-04 DIAGNOSIS — Z888 Allergy status to other drugs, medicaments and biological substances status: Secondary | ICD-10-CM | POA: Insufficient documentation

## 2019-07-04 DIAGNOSIS — E7800 Pure hypercholesterolemia, unspecified: Secondary | ICD-10-CM | POA: Insufficient documentation

## 2019-07-04 DIAGNOSIS — E1140 Type 2 diabetes mellitus with diabetic neuropathy, unspecified: Secondary | ICD-10-CM

## 2019-07-04 DIAGNOSIS — R109 Unspecified abdominal pain: Secondary | ICD-10-CM

## 2019-07-04 DIAGNOSIS — E118 Type 2 diabetes mellitus with unspecified complications: Secondary | ICD-10-CM

## 2019-07-04 LAB — GLUCOSE, POCT (MANUAL RESULT ENTRY): POC Glucose: 189 mg/dl — AB (ref 70–99)

## 2019-07-04 MED ORDER — HYDRALAZINE HCL 25 MG PO TABS: 25.0000 mg | ORAL_TABLET | Freq: Two times a day (BID) | ORAL | 3 refills | Status: DC

## 2019-07-04 MED ORDER — POLYETHYLENE GLYCOL 3350 17 G PO PACK: 17.0000 g | PACK | Freq: Every day | ORAL | 0 refills | Status: DC | PRN

## 2019-07-04 MED ORDER — PANTOPRAZOLE SODIUM 40 MG PO TBEC: 40.0000 mg | DELAYED_RELEASE_TABLET | Freq: Every day | ORAL | 3 refills | Status: DC

## 2019-07-04 MED ORDER — APIXABAN 5 MG PO TABS: 5.0000 mg | ORAL_TABLET | Freq: Two times a day (BID) | ORAL | 6 refills | Status: DC

## 2019-07-04 MED ORDER — ATORVASTATIN CALCIUM 40 MG PO TABS: 40.0000 mg | ORAL_TABLET | Freq: Every day | ORAL | 1 refills | Status: DC

## 2019-07-04 MED ORDER — NICOTINE 14 MG/24HR TD PT24: 14.0000 mg | MEDICATED_PATCH | Freq: Every day | TRANSDERMAL | 2 refills | Status: DC

## 2019-07-04 MED ORDER — DULOXETINE HCL 60 MG PO CPEP: 60.0000 mg | ORAL_CAPSULE | Freq: Every day | ORAL | 3 refills | Status: DC

## 2019-07-04 MED ORDER — MIRTAZAPINE 15 MG PO TABS: 15.0000 mg | ORAL_TABLET | Freq: Every day | ORAL | 3 refills | Status: DC

## 2019-07-04 MED ORDER — THIAMINE HCL 100 MG PO TABS: 100.0000 mg | ORAL_TABLET | Freq: Every day | ORAL | 0 refills | Status: DC

## 2019-07-04 MED ORDER — DILTIAZEM HCL 30 MG PO TABS: 30.0000 mg | ORAL_TABLET | Freq: Two times a day (BID) | ORAL | 3 refills | Status: DC

## 2019-07-04 MED ORDER — GABAPENTIN 300 MG PO CAPS: 600.0000 mg | ORAL_CAPSULE | Freq: Two times a day (BID) | ORAL | 3 refills | Status: DC

## 2019-07-04 MED ORDER — LANTUS SOLOSTAR 100 UNIT/ML ~~LOC~~ SOPN: 5.0000 [IU] | PEN_INJECTOR | Freq: Every day | SUBCUTANEOUS | 6 refills | Status: DC

## 2019-07-04 MED ORDER — FOLIC ACID 1 MG PO TABS: 6.0000 mg | ORAL_TABLET | Freq: Every day | ORAL | 6 refills | Status: DC

## 2019-07-04 MED ORDER — ISOSORBIDE MONONITRATE ER 30 MG PO TB24: 30.0000 mg | ORAL_TABLET | Freq: Every day | ORAL | 3 refills | Status: DC

## 2019-07-04 NOTE — Progress Notes (Signed)
Paramedicine Encounter    Patient ID: Brad [name], male    DOB: 1961-06-04, 58 y.o.   MRN: [id number]   Patient Care Team: Charlott Rakes, MD as PCP - General (Family Medicine) Debara Pickett Nadean Corwin, MD as PCP - Cardiology (Cardiology) Charlott Rakes, MD (Family Medicine)  Patient Active Problem List   Diagnosis Date Noted  . Hypomagnesemia 06/19/2019  . Congestive heart failure (CHF) ([place]) 06/12/2019  . Chest pain 06/07/2019  . Acute hepatic encephalopathy 06/07/2019  . Acute exacerbation of CHF (congestive heart failure) ([place]) 06/06/2019  . Elevated troponin 05/23/2019  . Tobacco abuse 05/23/2019  . CHF exacerbation ([place]) 05/23/2019  . Acute on chronic systolic (congestive) heart failure ([place]) 02/22/2019  . Acute on chronic systolic congestive heart failure ([place]) 02/21/2019  . Alcohol abuse 02/21/2019  . Frequent falls 01/17/2019  . Severe mitral regurgitation   . Fall 11/01/2018  . Hyponatremia 10/31/2018  . Abdominal distention   . Acute systolic congestive heart failure ([place])   . Chronic atrial fibrillation   . Chronic hyponatremia 08/16/2018  . NSVT (nonsustained ventricular tachycardia) ([place])   . Concussion with loss of consciousness   . Scalp laceration   . Trauma   . Permanent atrial fibrillation   . Hypertensive heart disease   . Shortness of breath   . ACS (acute coronary syndrome) ([place]) 07/25/2018  . Pyogenic inflammation of bone ([place])   . Ankle swelling, right 04/30/2018  . Cirrhosis ([place]) 03/23/2018  . Right ankle pain 03/23/2018  . Acute respiratory failure with hypoxia ([place]) 11/06/2017  . Syncope 08/07/2017  . Acute on chronic combined systolic and diastolic heart failure ([place]) 07/09/2017  . Atrial flutter ([place]) 07/09/2017  . Pre-syncope 07/08/2017  . Neuropathy 05/08/2017  . Substance induced mood disorder ([place]) 10/06/2016  . CVA (cerebral vascular accident) ([place]) 09/18/2016  . Left sided numbness   . Homelessness 08/21/2016  . S/P ORIF (open  reduction internal fixation) fracture 08/01/2016  . CAD (coronary artery disease), native coronary artery 07/30/2016  . Surgery, elective   . Insomnia 07/22/2016  . Acute diastolic heart failure ([place])   . NSTEMI (non-ST elevated myocardial infarction) ([place])   . Anemia 07/05/2016  . Thrombocytopenia ([place]) 07/05/2016  . AKI (acute kidney injury) ([place])   . Cocaine abuse ([place]) 07/02/2016  . Chest pain on breathing 07/01/2016  . Essential hypertension 07/01/2016  . Type 2 diabetes mellitus with complication, without long-term current use of insulin ([place]) 07/01/2016  . Hypokalemia 07/01/2016  . CKD (chronic kidney disease) stage 3, GFR 30-59 ml/min (HCC) 07/01/2016  . Painful diabetic neuropathy ([place]) 07/01/2016  . Polysubstance abuse ([place]) 05/27/2016  . Chronic hepatitis C with cirrhosis ([place]) 05/27/2016  . Chronic diastolic congestive heart failure ([place]) 01/31/2016  . Depression 04/21/2012  . GERD (gastroesophageal reflux disease) 02/16/2012  . History of drug abuse ([place])   . Heroin addiction ([place]) 01/29/2012    Current Outpatient Medications:  .  ACCU-CHEK FASTCLIX LANCETS MISC, Use as directed three times daily, Disp: 102 each, Rfl: 12 .  atorvastatin (LIPITOR) 40 MG tablet, Take 1 tablet (40 mg total) by mouth daily., Disp: 90 tablet, Rfl: 3 .  Blood Glucose Monitoring Suppl (ACCU-CHEK GUIDE) w/Device KIT, 1 each by Does not apply route 3 (three) times daily., Disp: 1 kit, Rfl: 0 .  cetirizine (ZYRTEC) 10 MG tablet, TAKE 1 TABLET (10 MG TOTAL) BY MOUTH DAILY., Disp: 30 tablet, Rfl: 2 .  diltiazem (CARDIZEM) 30 MG tablet, Take 1 tablet (30 mg total) by mouth  2 (two) times daily., Disp: 60 tablet, Rfl: 0 .  EASY COMFORT PEN NEEDLES 31G X 8 MM MISC, USE AS DIRECTED TWICE A DAY, Disp: 100 each, Rfl: 5 .  ELIQUIS 5 MG TABS tablet, TAKE 1 TABLET BY MOUTH TWO TIMES DAILY., Disp: 60 tablet, Rfl: 0 .  ferrous sulfate 325 (65 FE) MG tablet, Take 1 tablet (325 mg total) by mouth daily with  breakfast., Disp: 30 tablet, Rfl: 0 .  folic acid (FOLVITE) 1 MG tablet, Take 1 tablet (1 mg total) by mouth daily., Disp: 30 tablet, Rfl: 0 .  gabapentin (NEURONTIN) 300 MG capsule, Take 1 capsule (300 mg total) by mouth 2 (two) times daily., Disp: 60 capsule, Rfl: 0 .  glucose blood (ACCU-CHEK GUIDE) test strip, Use as directed three times daily, Disp: 100 each, Rfl: 12 .  hydrALAZINE (APRESOLINE) 25 MG tablet, Take 1 tablet (25 mg total) by mouth 2 (two) times a day., Disp: 90 tablet, Rfl: 0 .  isosorbide mononitrate (IMDUR) 30 MG 24 hr tablet, Take 1 tablet (30 mg total) by mouth daily., Disp: 30 tablet, Rfl: 0 .  Multiple Vitamin (MULTIVITAMIN WITH MINERALS) TABS tablet, Take 1 tablet by mouth daily., Disp: 30 tablet, Rfl: 0 .  nicotine (NICODERM CQ - DOSED IN MG/24 HOURS) 14 mg/24hr patch, Place 1 patch (14 mg total) onto the skin daily., Disp: 28 patch, Rfl: 0 .  oxyCODONE (ROXICODONE) 5 MG immediate release tablet, Take 1 tablet (5 mg total) by mouth 2 (two) times daily as needed for severe pain. (Patient not taking: Reported on 07/04/2019), Disp: 10 tablet, Rfl: 0 .  pantoprazole (PROTONIX) 40 MG tablet, Take 1 tablet (40 mg total) by mouth daily., Disp: 30 tablet, Rfl: 0 .  polyethylene glycol (MIRALAX / GLYCOLAX) 17 g packet, Take 17 g by mouth daily as needed. (Patient taking differently: Take 17 g by mouth daily as needed (constipation). ), Disp: 14 each, Rfl: 0 .  thiamine 100 MG tablet, Take 1 tablet (100 mg total) by mouth daily., Disp: 30 tablet, Rfl: 0 .  torsemide (DEMADEX) 20 MG tablet, Take 2 tablets (40 mg total) by mouth 2 (two) times daily., Disp: 120 tablet, Rfl: 1 Allergies  Allergen Reactions  . Angiotensin Receptor Blockers Anaphylaxis and Other (See Comments)    (Angioedema also with Lisinopril, therefore ARB's are contraindicated)  . Lisinopril Anaphylaxis and Other (See Comments)    Throat swells  . Pamelor [Nortriptyline Hcl] Anaphylaxis and Swelling    Throat  swells     Social History   Socioeconomic History  . Marital status: Married    Spouse name: Not on file  . Number of children: 3  . Years of education: 2y college  . Highest education level: Not on file  Occupational History  . 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 Financial resource strain: Not on file  . Food insecurity    Worry: Not on file    Inability: Not on file  . Transportation needs    Medical: Not on file    Non-medical: Not on file  Tobacco Use  . Smoking status: Current Every Day Smoker    Packs/day: 0.50    Years: 28.00    Pack years: 14.00    Types: Cigarettes  . Smokeless tobacco: Never Used  Substance and Sexual Activity  . Alcohol use: Yes    Alcohol/week: 7.0 standard drinks    Types: 7 Cans of  beer per week    Comment: "I drink ~ 40oz beer/day"  . Drug use: Yes    Types: IV, Cocaine, Heroin    Comment: 08/17/2018 "no heroin since 2013; I use cocaine ~ once/month, last use 03/21/2019  . Sexual activity: Not Currently  Lifestyle  . Physical activity    Days per week: Not on file    Minutes per session: Not on file  . Stress: Not on file  Relationships  . Social Herbalist on phone: Not on file    Gets together: Not on file    Attends religious service: Not on file    Active member of club or organization: Not on file    Attends meetings of clubs or organizations: Not on file    Relationship status: Not on file  . Intimate partner violence    Fear of current or ex partner: Not on file    Emotionally abused: Not on file    Physically abused: Not on file    Forced sexual activity: Not on file  Other Topics Concern  . Not on file  Social History Narrative   ** Merged History Encounter **       Incarcerated from 2006-2010, then 10/2011-12/2011.  Has been trying to get sober (no heroin, alcohol since 10/2011).     Physical Exam Vitals signs reviewed.  HENT:     Head: Normocephalic.      Nose: Nose normal.     Mouth/Throat:     Mouth: Mucous membranes are moist.  Eyes:     Pupils: Pupils are equal, round, and reactive to light.  Cardiovascular:     Rate and Rhythm: Normal rate and regular rhythm.     Pulses: Normal pulses.     Heart sounds: Normal heart sounds.  Pulmonary:     Effort: Pulmonary effort is normal.     Breath sounds: Normal breath sounds.  Abdominal:     General: Abdomen is flat.     Palpations: Abdomen is soft.  Musculoskeletal: Normal range of motion.     Right lower leg: Edema present.     Left lower leg: Edema present.     Comments: Bilateral leg swelling, patient complains of burning in both legs and feet.   Skin:    General: Skin is warm and dry.     Capillary Refill: Capillary refill takes less than 2 seconds.  Neurological:     Mental Status: He is alert.  Psychiatric:        Mood and Affect: Mood normal.      Arrived for clinic visit with Mr. Buzby who was seated in the office chair alert in oriented in no obvious distress. Mr. Moragne had noticeable swelling to both lower extremities. Daxx complained of right ankle pain, which he always has. Mr. Buddenhagen also stated he fell on Friday which resulted in him having a small laceration to the back of the right ankle. No other injuries noted from the fall. He denied loss of consciousness upon falling. Mr. Dupree vitals were obtained by CMA. Medications were reviewed. Dr. Margarita Rana assessed patient and medications were reviewed and refill orders were sent to [facility]. Patient advised he would pick up medications once leaving appointment. Patient expressed to Dr. Margarita Rana he has had issues with constipation lately and she followed with assessment and prescribing him Miralax for same. Dr. Margarita Rana also changed him Amitriptyline to Cymbalta and Remeron. Patient was also made aware of Lantus being added back  to med list. Gabapentin was also increased to 650m. All medications added to list. Clinic  obtained labs. Clinic visit complete.    I went out for home visit in this same day to review, verify and complete pill box. Isosorbide was missing and Folic Acid will run out by Thursday. Call will be made to [facility] for follow up. All was completed and home visit complete.   Sp[facility] to [employer] will fill Isosorbide and Folic Acid and deliver tomorrow, patient was instructed to call me for instructions on placing same in pill box.     Weight today-180lbs CBG- 189    Glucometer from clinic will be delivered next Monday.   Future Appointments  Date Time Provider [place] 09/06/2019  3:10 PM NCharlott Rakes MD [id number] None     ACTION: Home visit completed Next visit planned for one week

## 2019-07-04 NOTE — Telephone Encounter (Signed)
Replacement glucometer received from [facility].  [name], EMT to deliver to patient.

## 2019-07-04 NOTE — Patient Instructions (Signed)
Steps to Quit Smoking Smoking tobacco is the leading cause of preventable death. It can affect almost every organ in the body. Smoking puts you and those around you at risk for developing many serious chronic diseases. Quitting smoking can be difficult, but it is one of the best things that you can do for your health. It is never too late to quit. How do I get ready to quit? When you decide to quit smoking, create a plan to help you succeed. Before you quit:  Pick a date to quit. Set a date within the next 2 weeks to give you time to prepare.  Write down the reasons why you are quitting. Keep this list in places where you will see it often.  Tell your family, friends, and co-workers that you are quitting. Support from your loved ones can make quitting easier.  Talk with your health care provider about your options for quitting smoking.  Find out what treatment options are covered by your health insurance.  Identify people, places, things, and activities that make you want to smoke (triggers). Avoid them. What first steps can I take to quit smoking?  Throw away all cigarettes at home, at work, and in your car.  Throw away smoking accessories, such as ashtrays and lighters.  Clean your car. Make sure to empty the ashtray.  Clean your home, including curtains and carpets. What strategies can I use to quit smoking? Talk with your health care provider about combining strategies, such as taking medicines while you are also receiving in-person counseling. Using these two strategies together makes you more likely to succeed in quitting than if you used either strategy on its own.  If you are pregnant or breastfeeding, talk with your health care provider about finding counseling or other support strategies to quit smoking. Do not take medicine to help you quit smoking unless your health care provider tells you to do so. To quit smoking: Quit right away  Quit smoking completely, instead of  gradually reducing how much you smoke over a period of time. Research shows that stopping smoking right away is more successful than gradually quitting.  Attend in-person counseling to help you build problem-solving skills. You are more likely to succeed in quitting if you attend counseling sessions regularly. Even short sessions of 10 minutes can be effective. Take medicine You may take medicines to help you quit smoking. Some medicines require a prescription and some you can purchase over-the-counter. Medicines may have nicotine in them to replace the nicotine in cigarettes. Medicines may:  Help to stop cravings.  Help to relieve withdrawal symptoms. Your health care provider may recommend:  Nicotine patches, gum, or lozenges.  Nicotine inhalers or sprays.  Non-nicotine medicine that is taken by mouth. Find resources Find resources and support systems that can help you to quit smoking and remain smoke-free after you quit. These resources are most helpful when you use them often. They include:  Online chats with a counselor.  Telephone quitlines.  Printed self-help materials.  Support groups or group counseling.  Text messaging programs.  Mobile phone apps or applications. Use apps that can help you stick to your quit plan by providing reminders, tips, and encouragement. There are many free apps for mobile devices as well as websites. Examples include Quit Guide from the CDC and smokefree.gov What things can I do to make it easier to quit?   Reach out to your family and friends for support and encouragement. Call telephone quitlines (1-800-QUIT-NOW), reach   out to support groups, or work with a counselor for support.  Ask people who smoke to avoid smoking around you.  Avoid places that trigger you to smoke, such as bars, parties, or smoke-break areas at work.  Spend time with people who do not smoke.  Lessen the stress in your life. Stress can be a smoking trigger for some  people. To lessen stress, try: ? Exercising regularly. ? Doing deep-breathing exercises. ? Doing yoga. ? Meditating. ? Performing a body scan. This involves closing your eyes, scanning your body from head to toe, and noticing which parts of your body are particularly tense. Try to relax the muscles in those areas. How will I feel when I quit smoking? Day 1 to 3 weeks Within the first 24 hours of quitting smoking, you may start to feel withdrawal symptoms. These symptoms are usually most noticeable 2-3 days after quitting, but they usually do not last for more than 2-3 weeks. You may experience these symptoms:  Mood swings.  Restlessness, anxiety, or irritability.  Trouble concentrating.  Dizziness.  Strong cravings for sugary foods and nicotine.  Mild weight gain.  Constipation.  Nausea.  Coughing or a sore throat.  Changes in how the medicines that you take for unrelated issues work in your body.  Depression.  Trouble sleeping (insomnia). Week 3 and afterward After the first 2-3 weeks of quitting, you may start to notice more positive results, such as:  Improved sense of smell and taste.  Decreased coughing and sore throat.  Slower heart rate.  Lower blood pressure.  Clearer skin.  The ability to breathe more easily.  Fewer sick days. Quitting smoking can be very challenging. Do not get discouraged if you are not successful the first time. Some people need to make many attempts to quit before they achieve long-term success. Do your best to stick to your quit plan, and talk with your health care provider if you have any questions or concerns. Summary  Smoking tobacco is the leading cause of preventable death. Quitting smoking is one of the best things that you can do for your health.  When you decide to quit smoking, create a plan to help you succeed.  Quit smoking right away, not slowly over a period of time.  When you start quitting, seek help from your  health care provider, family, or friends. This information is not intended to replace advice given to you by your health care provider. Make sure you discuss any questions you have with your health care provider. Document Released: 12/02/2001 Document Revised: 02/25/2019 Document Reviewed: 02/26/2019 Elsevier Patient Education  2020 [employer]  
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
## 2019-07-04 NOTE — Progress Notes (Signed)
Brad [name] is having pain in his ankle.  Patient is still having neuropathy pain.

## 2019-07-05 ENCOUNTER — Other Ambulatory Visit: Payer: Self-pay | Admitting: Family Medicine

## 2019-07-05 LAB — BASIC METABOLIC PANEL
BUN/Creatinine Ratio: 18 (ref 9–20)
BUN: 21 mg/dL (ref 6–24)
CO2: 25 mmol/L (ref 20–29)
Calcium: 8.5 mg/dL — ABNORMAL LOW (ref 8.7–10.2)
Chloride: 85 mmol/L — ABNORMAL LOW (ref 96–106)
Creatinine, Ser: 1.17 mg/dL (ref 0.76–1.27)
GFR calc Af Amer: 78 mL/min/{1.73_m2} (ref >59)
GFR calc non Af Amer: 68 mL/min/{1.73_m2} (ref >59)
Glucose: 164 mg/dL — ABNORMAL HIGH (ref 65–99)
Potassium: 3.3 mmol/L — ABNORMAL LOW (ref 3.5–5.2)
Sodium: 124 mmol/L — ABNORMAL LOW (ref 134–144)

## 2019-07-05 LAB — MAGNESIUM: Magnesium: 1.4 mg/dL — ABNORMAL LOW (ref 1.6–2.3)

## 2019-07-05 LAB — BRAIN NATRIURETIC PEPTIDE: BNP: 1694.1 pg/mL — ABNORMAL HIGH (ref 0.0–100.0)

## 2019-07-05 MED ORDER — TORSEMIDE 20 MG PO TABS: 60.0000 mg | ORAL_TABLET | Freq: Two times a day (BID) | ORAL | 1 refills | Status: DC

## 2019-07-05 MED ORDER — MAGNESIUM OXIDE 400 MG PO CAPS: 400.0000 mg | ORAL_CAPSULE | Freq: Two times a day (BID) | ORAL | 1 refills | Status: DC

## 2019-07-05 MED ORDER — POTASSIUM CHLORIDE CRYS ER 20 MEQ PO TBCR: 20.0000 meq | EXTENDED_RELEASE_TABLET | Freq: Every day | ORAL | 3 refills | Status: DC

## 2019-07-06 ENCOUNTER — Telehealth: Payer: Self-pay

## 2019-07-06 NOTE — Telephone Encounter (Signed)
Call placed to patient and informed him that as per Dr Margarita Rana, his labs reveal he is in fluid overload. She raised Torsemide to 60mg  bid. Potassium is low and she write a  script for Potassium pills. Magnesium is also low and I have written magnesium pills for him. He needs to follow up with Cardiology.  He said that he will check with [name] [place] about medication delivery.  Message sent to [name], EMT with this information.

## 2019-07-11 ENCOUNTER — Emergency Department (HOSPITAL_COMMUNITY): Payer: Medicaid Other

## 2019-07-11 ENCOUNTER — Emergency Department (HOSPITAL_COMMUNITY)
Admission: EM | Admit: 2019-07-11 | Discharge: 2019-07-11 | Disposition: A | Discharge status: Home / Self Care | Payer: Medicaid Other | Attending: Emergency Medicine | Admitting: Emergency Medicine

## 2019-07-11 ENCOUNTER — Other Ambulatory Visit: Payer: Self-pay

## 2019-07-11 ENCOUNTER — Other Ambulatory Visit (HOSPITAL_COMMUNITY): Payer: Self-pay

## 2019-07-11 DIAGNOSIS — M542 Cervicalgia: Secondary | ICD-10-CM | POA: Insufficient documentation

## 2019-07-11 DIAGNOSIS — I252 Old myocardial infarction: Secondary | ICD-10-CM | POA: Insufficient documentation

## 2019-07-11 DIAGNOSIS — I2510 Atherosclerotic heart disease of native coronary artery without angina pectoris: Secondary | ICD-10-CM | POA: Insufficient documentation

## 2019-07-11 DIAGNOSIS — R079 Chest pain, unspecified: Secondary | ICD-10-CM

## 2019-07-11 DIAGNOSIS — E1122 Type 2 diabetes mellitus with diabetic chronic kidney disease: Secondary | ICD-10-CM | POA: Diagnosis not present

## 2019-07-11 DIAGNOSIS — N183 Chronic kidney disease, stage 3 (moderate): Secondary | ICD-10-CM | POA: Insufficient documentation

## 2019-07-11 DIAGNOSIS — R072 Precordial pain: Secondary | ICD-10-CM | POA: Diagnosis not present

## 2019-07-11 DIAGNOSIS — R1084 Generalized abdominal pain: Secondary | ICD-10-CM | POA: Insufficient documentation

## 2019-07-11 DIAGNOSIS — R600 Localized edema: Secondary | ICD-10-CM | POA: Insufficient documentation

## 2019-07-11 DIAGNOSIS — I130 Hypertensive heart and chronic kidney disease with heart failure and stage 1 through stage 4 chronic kidney disease, or unspecified chronic kidney disease: Secondary | ICD-10-CM | POA: Diagnosis not present

## 2019-07-11 DIAGNOSIS — F17210 Nicotine dependence, cigarettes, uncomplicated: Secondary | ICD-10-CM | POA: Insufficient documentation

## 2019-07-11 DIAGNOSIS — I5042 Chronic combined systolic (congestive) and diastolic (congestive) heart failure: Secondary | ICD-10-CM | POA: Insufficient documentation

## 2019-07-11 DIAGNOSIS — Z8546 Personal history of malignant neoplasm of prostate: Secondary | ICD-10-CM | POA: Insufficient documentation

## 2019-07-11 LAB — BASIC METABOLIC PANEL
Anion gap: 11 (ref 5–15)
BUN: 12 mg/dL (ref 6–20)
CO2: 28 mmol/L (ref 22–32)
Calcium: 8 mg/dL — ABNORMAL LOW (ref 8.9–10.3)
Chloride: 89 mmol/L — ABNORMAL LOW (ref 98–111)
Creatinine, Ser: 1.28 mg/dL — ABNORMAL HIGH (ref 0.61–1.24)
GFR calc Af Amer: >60 mL/min (ref >60)
GFR calc non Af Amer: >60 mL/min (ref >60)
Glucose, Bld: 140 mg/dL — ABNORMAL HIGH (ref 70–99)
Potassium: 3.2 mmol/L — ABNORMAL LOW (ref 3.5–5.1)
Sodium: 128 mmol/L — ABNORMAL LOW (ref 135–145)

## 2019-07-11 LAB — CBC
HCT: 36.6 % — ABNORMAL LOW (ref 39.0–52.0)
Hemoglobin: 12.8 g/dL — ABNORMAL LOW (ref 13.0–17.0)
MCH: 31.7 pg (ref 26.0–34.0)
MCHC: 35 g/dL (ref 30.0–36.0)
MCV: 90.6 fL (ref 80.0–100.0)
Platelets: 202 10*3/uL (ref 150–400)
RBC: 4.04 MIL/uL — ABNORMAL LOW (ref 4.22–5.81)
RDW: 13.7 % (ref 11.5–15.5)
WBC: 2.9 10*3/uL — ABNORMAL LOW (ref 4.0–10.5)
nRBC: 0 % (ref 0.0–0.2)

## 2019-07-11 LAB — TROPONIN I (HIGH SENSITIVITY)
Troponin I (High Sensitivity): 38 ng/L — ABNORMAL HIGH (ref <18)
Troponin I (High Sensitivity): 43 ng/L — ABNORMAL HIGH (ref <18)

## 2019-07-11 LAB — BRAIN NATRIURETIC PEPTIDE: B Natriuretic Peptide: 836.4 pg/mL — ABNORMAL HIGH (ref 0.0–100.0)

## 2019-07-11 MED ORDER — OXYCODONE-ACETAMINOPHEN 5-325 MG PO TABS
1.0000 | ORAL_TABLET | Freq: Once | ORAL | Status: AC
  Administered 2019-07-11: 1 via ORAL
  Filled 2019-07-11: qty 1

## 2019-07-11 MED ORDER — METHOCARBAMOL 500 MG PO TABS: 500.0000 mg | ORAL_TABLET | Freq: Two times a day (BID) | ORAL | 0 refills | Status: DC

## 2019-07-11 MED ORDER — METHOCARBAMOL 500 MG PO TABS
500.0000 mg | ORAL_TABLET | Freq: Once | ORAL | Status: AC
  Administered 2019-07-11: 500 mg via ORAL
  Filled 2019-07-11: qty 1

## 2019-07-11 MED ORDER — LIDOCAINE 5 % EX PTCH
1.0000 | MEDICATED_PATCH | CUTANEOUS | Status: DC
  Administered 2019-07-11: 1 via TRANSDERMAL
  Filled 2019-07-11: qty 1

## 2019-07-11 MED ORDER — POTASSIUM CHLORIDE CRYS ER 20 MEQ PO TBCR
40.0000 meq | EXTENDED_RELEASE_TABLET | Freq: Once | ORAL | Status: AC
  Administered 2019-07-11: 40 meq via ORAL
  Filled 2019-07-11: qty 2

## 2019-07-11 MED ORDER — MORPHINE SULFATE (PF) 4 MG/ML IV SOLN
4.0000 mg | Freq: Once | INTRAVENOUS | Status: AC
  Administered 2019-07-11: 4 mg via INTRAVENOUS
  Filled 2019-07-11: qty 1

## 2019-07-11 MED ORDER — SODIUM CHLORIDE 0.9 % IV BOLUS: 1000.0000 mL | Freq: Once | INTRAVENOUS | Status: DC
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
## 2019-07-11 NOTE — Progress Notes (Signed)
Today patient was seen for transport by EMS due to chest pain and neck pain. I arrived at the home and transported patient with PTAR carrying out chest pain protocols with ALS measures en route to [facility]. [name] stated he was seated outside and had a sudden onset of 10/10  chest pressure with center sharp neck pain. [name] was transferred to RN in ED upon arrival to [facility]. Medications were verified there in the ED and pills were placed in pill box accordingly. [name] was seen by ED staff. Call cleared at 1205.    [name] called and asked if I had any food for him to take home due to him needing some in the home. I spoke with CM [name] who provided some food in a bag in which I picked up and took over to [name] around [phone].   [name] was also given his new glucometer provided by CM [name] at [facility].   [name] agreed to home visit next Monday.

## 2019-07-11 NOTE — Discharge Instructions (Signed)
Start the potassium and magnesium You can stop the Iron and Miralax for one week to see if this helps your abdominal pain and diarrhea Take Robaxin as needed for muscle pain and spasms Please follow up with cardiology Return if worsening

## 2019-07-11 NOTE — ED Triage Notes (Signed)
EMS called for chest pain x 1 hour. History of CHF. Pain rated 10/10 in center of chest and radiates into back of neck. 324mg  aspirin, 1 nitro, 4mg  zofran prior to arrival. No relief from nitro

## 2019-07-11 NOTE — ED Notes (Signed)
Patient transported to X-ray 

## 2019-07-11 NOTE — ED Notes (Signed)
Patient Alert and oriented to baseline. Stable and ambulatory to baseline. Patient verbalized understanding of the discharge instructions.  Patient belongings were taken by the patient.   

## 2019-07-16 ENCOUNTER — Other Ambulatory Visit: Payer: Self-pay

## 2019-07-16 ENCOUNTER — Emergency Department (HOSPITAL_COMMUNITY)
Admission: EM | Admit: 2019-07-16 | Discharge: 2019-07-16 | Disposition: A | Discharge status: Home / Self Care | Payer: Medicaid Other | Source: Home / Self Care | Attending: Emergency Medicine | Admitting: Emergency Medicine

## 2019-07-16 ENCOUNTER — Emergency Department (HOSPITAL_COMMUNITY): Payer: Medicaid Other

## 2019-07-16 DIAGNOSIS — I130 Hypertensive heart and chronic kidney disease with heart failure and stage 1 through stage 4 chronic kidney disease, or unspecified chronic kidney disease: Secondary | ICD-10-CM | POA: Insufficient documentation

## 2019-07-16 DIAGNOSIS — Z8546 Personal history of malignant neoplasm of prostate: Secondary | ICD-10-CM | POA: Insufficient documentation

## 2019-07-16 DIAGNOSIS — R600 Localized edema: Secondary | ICD-10-CM | POA: Insufficient documentation

## 2019-07-16 DIAGNOSIS — E119 Type 2 diabetes mellitus without complications: Secondary | ICD-10-CM | POA: Insufficient documentation

## 2019-07-16 DIAGNOSIS — Z7901 Long term (current) use of anticoagulants: Secondary | ICD-10-CM | POA: Insufficient documentation

## 2019-07-16 DIAGNOSIS — N183 Chronic kidney disease, stage 3 (moderate): Secondary | ICD-10-CM | POA: Insufficient documentation

## 2019-07-16 DIAGNOSIS — R079 Chest pain, unspecified: Secondary | ICD-10-CM

## 2019-07-16 DIAGNOSIS — F17210 Nicotine dependence, cigarettes, uncomplicated: Secondary | ICD-10-CM | POA: Insufficient documentation

## 2019-07-16 DIAGNOSIS — Z79899 Other long term (current) drug therapy: Secondary | ICD-10-CM | POA: Insufficient documentation

## 2019-07-16 DIAGNOSIS — R609 Edema, unspecified: Secondary | ICD-10-CM

## 2019-07-16 DIAGNOSIS — I5022 Chronic systolic (congestive) heart failure: Secondary | ICD-10-CM | POA: Insufficient documentation

## 2019-07-16 LAB — PROTIME-INR
INR: 1.1 (ref 0.8–1.2)
Prothrombin Time: 14.3 seconds (ref 11.4–15.2)

## 2019-07-16 LAB — CBC
HCT: 36.2 % — ABNORMAL LOW (ref 39.0–52.0)
Hemoglobin: 12.6 g/dL — ABNORMAL LOW (ref 13.0–17.0)
MCH: 31.5 pg (ref 26.0–34.0)
MCHC: 34.8 g/dL (ref 30.0–36.0)
MCV: 90.5 fL (ref 80.0–100.0)
Platelets: 180 10*3/uL (ref 150–400)
RBC: 4 MIL/uL — ABNORMAL LOW (ref 4.22–5.81)
RDW: 13.8 % (ref 11.5–15.5)
WBC: 2.9 10*3/uL — ABNORMAL LOW (ref 4.0–10.5)
nRBC: 0 % (ref 0.0–0.2)

## 2019-07-16 LAB — TROPONIN I (HIGH SENSITIVITY)
Troponin I (High Sensitivity): 43 ng/L — ABNORMAL HIGH (ref <18)
Troponin I (High Sensitivity): 40 ng/L — ABNORMAL HIGH (ref <18)

## 2019-07-16 LAB — BASIC METABOLIC PANEL
Anion gap: 12 (ref 5–15)
BUN: 13 mg/dL (ref 6–20)
CO2: 25 mmol/L (ref 22–32)
Calcium: 7.7 mg/dL — ABNORMAL LOW (ref 8.9–10.3)
Chloride: 93 mmol/L — ABNORMAL LOW (ref 98–111)
Creatinine, Ser: 1.33 mg/dL — ABNORMAL HIGH (ref 0.61–1.24)
GFR calc Af Amer: >60 mL/min (ref >60)
GFR calc non Af Amer: 58 mL/min — ABNORMAL LOW (ref >60)
Glucose, Bld: 151 mg/dL — ABNORMAL HIGH (ref 70–99)
Potassium: 2.9 mmol/L — ABNORMAL LOW (ref 3.5–5.1)
Sodium: 130 mmol/L — ABNORMAL LOW (ref 135–145)

## 2019-07-16 MED ORDER — SODIUM CHLORIDE 0.9% FLUSH: 3.0000 mL | Freq: Once | INTRAVENOUS | Status: DC

## 2019-07-16 MED ORDER — HYDROCHLOROTHIAZIDE 12.5 MG PO CAPS
12.5000 mg | ORAL_CAPSULE | Freq: Once | ORAL | Status: AC
  Administered 2019-07-16: 12.5 mg via ORAL
  Filled 2019-07-16: qty 1

## 2019-07-16 MED ORDER — POTASSIUM CHLORIDE CRYS ER 20 MEQ PO TBCR
60.0000 meq | EXTENDED_RELEASE_TABLET | Freq: Once | ORAL | Status: AC
  Administered 2019-07-16: 60 meq via ORAL
  Filled 2019-07-16: qty 3

## 2019-07-16 MED ORDER — HYDROMORPHONE HCL 1 MG/ML IJ SOLN
1.0000 mg | Freq: Once | INTRAMUSCULAR | Status: AC
  Administered 2019-07-16: 1 mg via INTRAVENOUS
  Filled 2019-07-16: qty 1

## 2019-07-16 MED ORDER — FUROSEMIDE 10 MG/ML IJ SOLN
60.0000 mg | Freq: Once | INTRAMUSCULAR | Status: AC
  Administered 2019-07-16: 60 mg via INTRAVENOUS
  Filled 2019-07-16: qty 6
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
## 2019-07-16 NOTE — ED Triage Notes (Signed)
C/o diarrhea, chest pain and leg swelling. Reported takes 15 pills / day. Reported prior drug use.

## 2019-07-17 ENCOUNTER — Other Ambulatory Visit: Payer: Self-pay

## 2019-07-17 ENCOUNTER — Inpatient Hospital Stay (HOSPITAL_COMMUNITY)
Admission: EM | Admit: 2019-07-17 | Discharge: 2019-07-22 | DRG: 291 | Disposition: A | Discharge status: Home / Self Care | Payer: Medicaid Other | Attending: Internal Medicine | Admitting: Internal Medicine

## 2019-07-17 ENCOUNTER — Encounter (HOSPITAL_COMMUNITY): Payer: Self-pay | Admitting: Emergency Medicine

## 2019-07-17 ENCOUNTER — Emergency Department (HOSPITAL_COMMUNITY): Payer: Medicaid Other

## 2019-07-17 DIAGNOSIS — I130 Hypertensive heart and chronic kidney disease with heart failure and stage 1 through stage 4 chronic kidney disease, or unspecified chronic kidney disease: Secondary | ICD-10-CM | POA: Diagnosis not present

## 2019-07-17 DIAGNOSIS — G8929 Other chronic pain: Secondary | ICD-10-CM | POA: Diagnosis present

## 2019-07-17 DIAGNOSIS — I428 Other cardiomyopathies: Secondary | ICD-10-CM | POA: Diagnosis present

## 2019-07-17 DIAGNOSIS — I509 Heart failure, unspecified: Secondary | ICD-10-CM

## 2019-07-17 DIAGNOSIS — N183 Chronic kidney disease, stage 3 unspecified: Secondary | ICD-10-CM | POA: Diagnosis present

## 2019-07-17 DIAGNOSIS — I119 Hypertensive heart disease without heart failure: Secondary | ICD-10-CM | POA: Diagnosis present

## 2019-07-17 DIAGNOSIS — I340 Nonrheumatic mitral (valve) insufficiency: Secondary | ICD-10-CM | POA: Diagnosis present

## 2019-07-17 DIAGNOSIS — R079 Chest pain, unspecified: Secondary | ICD-10-CM | POA: Diagnosis present

## 2019-07-17 DIAGNOSIS — Z20828 Contact with and (suspected) exposure to other viral communicable diseases: Secondary | ICD-10-CM | POA: Diagnosis present

## 2019-07-17 DIAGNOSIS — I5043 Acute on chronic combined systolic (congestive) and diastolic (congestive) heart failure: Secondary | ICD-10-CM | POA: Diagnosis present

## 2019-07-17 DIAGNOSIS — K219 Gastro-esophageal reflux disease without esophagitis: Secondary | ICD-10-CM | POA: Diagnosis present

## 2019-07-17 DIAGNOSIS — Z8546 Personal history of malignant neoplasm of prostate: Secondary | ICD-10-CM

## 2019-07-17 DIAGNOSIS — Z8249 Family history of ischemic heart disease and other diseases of the circulatory system: Secondary | ICD-10-CM

## 2019-07-17 DIAGNOSIS — Z87892 Personal history of anaphylaxis: Secondary | ICD-10-CM

## 2019-07-17 DIAGNOSIS — E785 Hyperlipidemia, unspecified: Secondary | ICD-10-CM | POA: Diagnosis present

## 2019-07-17 DIAGNOSIS — B182 Chronic viral hepatitis C: Secondary | ICD-10-CM | POA: Diagnosis present

## 2019-07-17 DIAGNOSIS — J449 Chronic obstructive pulmonary disease, unspecified: Secondary | ICD-10-CM | POA: Diagnosis present

## 2019-07-17 DIAGNOSIS — I5023 Acute on chronic systolic (congestive) heart failure: Secondary | ICD-10-CM | POA: Diagnosis present

## 2019-07-17 DIAGNOSIS — E876 Hypokalemia: Secondary | ICD-10-CM | POA: Diagnosis present

## 2019-07-17 DIAGNOSIS — I4892 Unspecified atrial flutter: Secondary | ICD-10-CM | POA: Diagnosis present

## 2019-07-17 DIAGNOSIS — I10 Essential (primary) hypertension: Secondary | ICD-10-CM | POA: Diagnosis not present

## 2019-07-17 DIAGNOSIS — I2510 Atherosclerotic heart disease of native coronary artery without angina pectoris: Secondary | ICD-10-CM | POA: Diagnosis present

## 2019-07-17 DIAGNOSIS — Z720 Tobacco use: Secondary | ICD-10-CM | POA: Diagnosis present

## 2019-07-17 DIAGNOSIS — E1122 Type 2 diabetes mellitus with diabetic chronic kidney disease: Secondary | ICD-10-CM | POA: Diagnosis present

## 2019-07-17 DIAGNOSIS — R197 Diarrhea, unspecified: Secondary | ICD-10-CM

## 2019-07-17 DIAGNOSIS — E118 Type 2 diabetes mellitus with unspecified complications: Secondary | ICD-10-CM | POA: Diagnosis not present

## 2019-07-17 DIAGNOSIS — Z803 Family history of malignant neoplasm of breast: Secondary | ICD-10-CM

## 2019-07-17 DIAGNOSIS — Z8673 Personal history of transient ischemic attack (TIA), and cerebral infarction without residual deficits: Secondary | ICD-10-CM

## 2019-07-17 DIAGNOSIS — F1410 Cocaine abuse, uncomplicated: Secondary | ICD-10-CM | POA: Diagnosis present

## 2019-07-17 DIAGNOSIS — A044 Other intestinal Escherichia coli infections: Secondary | ICD-10-CM | POA: Diagnosis present

## 2019-07-17 DIAGNOSIS — E1169 Type 2 diabetes mellitus with other specified complication: Secondary | ICD-10-CM | POA: Diagnosis not present

## 2019-07-17 DIAGNOSIS — M1990 Unspecified osteoarthritis, unspecified site: Secondary | ICD-10-CM | POA: Diagnosis present

## 2019-07-17 DIAGNOSIS — R0602 Shortness of breath: Secondary | ICD-10-CM | POA: Diagnosis present

## 2019-07-17 DIAGNOSIS — E1140 Type 2 diabetes mellitus with diabetic neuropathy, unspecified: Secondary | ICD-10-CM | POA: Diagnosis present

## 2019-07-17 DIAGNOSIS — Z888 Allergy status to other drugs, medicaments and biological substances status: Secondary | ICD-10-CM

## 2019-07-17 DIAGNOSIS — F1010 Alcohol abuse, uncomplicated: Secondary | ICD-10-CM | POA: Diagnosis present

## 2019-07-17 DIAGNOSIS — Z7901 Long term (current) use of anticoagulants: Secondary | ICD-10-CM

## 2019-07-17 DIAGNOSIS — K7460 Unspecified cirrhosis of liver: Secondary | ICD-10-CM | POA: Diagnosis present

## 2019-07-17 DIAGNOSIS — K529 Noninfective gastroenteritis and colitis, unspecified: Secondary | ICD-10-CM

## 2019-07-17 DIAGNOSIS — Z794 Long term (current) use of insulin: Secondary | ICD-10-CM

## 2019-07-17 DIAGNOSIS — E119 Type 2 diabetes mellitus without complications: Secondary | ICD-10-CM | POA: Diagnosis present

## 2019-07-17 DIAGNOSIS — I499 Cardiac arrhythmia, unspecified: Secondary | ICD-10-CM

## 2019-07-17 DIAGNOSIS — Z5900 Homelessness unspecified: Secondary | ICD-10-CM

## 2019-07-17 DIAGNOSIS — Z79899 Other long term (current) drug therapy: Secondary | ICD-10-CM

## 2019-07-17 DIAGNOSIS — Z8041 Family history of malignant neoplasm of ovary: Secondary | ICD-10-CM

## 2019-07-17 DIAGNOSIS — F17210 Nicotine dependence, cigarettes, uncomplicated: Secondary | ICD-10-CM | POA: Diagnosis present

## 2019-07-17 DIAGNOSIS — T1490XA Injury, unspecified, initial encounter: Secondary | ICD-10-CM | POA: Diagnosis present

## 2019-07-17 DIAGNOSIS — I4821 Permanent atrial fibrillation: Secondary | ICD-10-CM | POA: Diagnosis present

## 2019-07-17 LAB — COMPREHENSIVE METABOLIC PANEL
ALT: 29 U/L (ref 0–44)
AST: 35 U/L (ref 15–41)
Albumin: 2.6 g/dL — ABNORMAL LOW (ref 3.5–5.0)
Alkaline Phosphatase: 58 U/L (ref 38–126)
Anion gap: 13 (ref 5–15)
BUN: 16 mg/dL (ref 6–20)
CO2: 22 mmol/L (ref 22–32)
Calcium: 8 mg/dL — ABNORMAL LOW (ref 8.9–10.3)
Chloride: 94 mmol/L — ABNORMAL LOW (ref 98–111)
Creatinine, Ser: 1.27 mg/dL — ABNORMAL HIGH (ref 0.61–1.24)
GFR calc Af Amer: >60 mL/min (ref >60)
GFR calc non Af Amer: >60 mL/min (ref >60)
Glucose, Bld: 121 mg/dL — ABNORMAL HIGH (ref 70–99)
Potassium: 2.8 mmol/L — ABNORMAL LOW (ref 3.5–5.1)
Sodium: 129 mmol/L — ABNORMAL LOW (ref 135–145)
Total Bilirubin: 1 mg/dL (ref 0.3–1.2)
Total Protein: 6.4 g/dL — ABNORMAL LOW (ref 6.5–8.1)

## 2019-07-17 LAB — CBC WITH DIFFERENTIAL/PLATELET
Abs Immature Granulocytes: 0.01 10*3/uL (ref 0.00–0.07)
Basophils Absolute: 0 10*3/uL (ref 0.0–0.1)
Basophils Relative: 1 %
Eosinophils Absolute: 0 10*3/uL (ref 0.0–0.5)
Eosinophils Relative: 1 %
HCT: 40.3 % (ref 39.0–52.0)
Hemoglobin: 13.8 g/dL (ref 13.0–17.0)
Immature Granulocytes: 0 %
Lymphocytes Relative: 13 %
Lymphs Abs: 0.6 10*3/uL — ABNORMAL LOW (ref 0.7–4.0)
MCH: 31.5 pg (ref 26.0–34.0)
MCHC: 34.2 g/dL (ref 30.0–36.0)
MCV: 92 fL (ref 80.0–100.0)
Monocytes Absolute: 0.7 10*3/uL (ref 0.1–1.0)
Monocytes Relative: 16 %
Neutro Abs: 3.1 10*3/uL (ref 1.7–7.7)
Neutrophils Relative %: 69 %
Platelets: 192 10*3/uL (ref 150–400)
RBC: 4.38 MIL/uL (ref 4.22–5.81)
RDW: 13.8 % (ref 11.5–15.5)
WBC: 4.4 10*3/uL (ref 4.0–10.5)
nRBC: 0 % (ref 0.0–0.2)

## 2019-07-17 LAB — TROPONIN I (HIGH SENSITIVITY)
Troponin I (High Sensitivity): 35 ng/L — ABNORMAL HIGH (ref <18)
Troponin I (High Sensitivity): 45 ng/L — ABNORMAL HIGH (ref <18)

## 2019-07-17 LAB — MAGNESIUM: Magnesium: 1.4 mg/dL — ABNORMAL LOW (ref 1.7–2.4)

## 2019-07-17 LAB — POC OCCULT BLOOD, ED: Fecal Occult Bld: NEGATIVE

## 2019-07-17 LAB — BRAIN NATRIURETIC PEPTIDE: B Natriuretic Peptide: 1998.2 pg/mL — ABNORMAL HIGH (ref 0.0–100.0)

## 2019-07-17 LAB — SARS CORONAVIRUS 2 BY RT PCR (HOSPITAL ORDER, PERFORMED IN ~~LOC~~ HOSPITAL LAB): SARS Coronavirus 2: NEGATIVE

## 2019-07-17 LAB — LIPASE, BLOOD: Lipase: 38 U/L (ref 11–51)

## 2019-07-17 MED ORDER — FENTANYL CITRATE (PF) 100 MCG/2ML IJ SOLN
25.0000 ug | Freq: Once | INTRAMUSCULAR | Status: AC
  Administered 2019-07-17: 25 ug via INTRAVENOUS
  Filled 2019-07-17: qty 2

## 2019-07-17 MED ORDER — ACETAMINOPHEN 325 MG PO TABS
650.0000 mg | ORAL_TABLET | Freq: Once | ORAL | Status: DC
  Filled 2019-07-17 (×2): qty 2

## 2019-07-17 MED ORDER — POTASSIUM CHLORIDE 10 MEQ/100ML IV SOLN
10.0000 meq | Freq: Once | INTRAVENOUS | Status: AC
  Administered 2019-07-17 – 2019-07-18 (×2): 10 meq via INTRAVENOUS
  Filled 2019-07-17: qty 100

## 2019-07-17 MED ORDER — POTASSIUM CHLORIDE CRYS ER 20 MEQ PO TBCR
40.0000 meq | EXTENDED_RELEASE_TABLET | Freq: Once | ORAL | Status: AC
  Administered 2019-07-17: 40 meq via ORAL
  Filled 2019-07-17: qty 2

## 2019-07-17 MED ORDER — POTASSIUM CHLORIDE 10 MEQ/100ML IV SOLN
10.0000 meq | INTRAVENOUS | Status: AC
  Administered 2019-07-17: 10 meq via INTRAVENOUS
  Filled 2019-07-17 (×2): qty 100

## 2019-07-17 NOTE — ED Triage Notes (Signed)
Pt upset and cursing and states he is here for the same thing that he was here for yesterday. Reports leg swelling, diarrhea, and blood in stool.  Pt upset over being discharged yesterday.
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
## 2019-07-17 NOTE — ED Notes (Signed)
Pt is A-flutter w/BBB, tachy on monitor
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
## 2019-07-18 ENCOUNTER — Encounter: Payer: Self-pay | Admitting: Licensed Clinical Social Worker

## 2019-07-18 DIAGNOSIS — I10 Essential (primary) hypertension: Secondary | ICD-10-CM

## 2019-07-18 LAB — BASIC METABOLIC PANEL
Anion gap: 11 (ref 5–15)
Anion gap: 8 (ref 5–15)
BUN: 19 mg/dL (ref 6–20)
BUN: 19 mg/dL (ref 6–20)
CO2: 26 mmol/L (ref 22–32)
CO2: 27 mmol/L (ref 22–32)
Calcium: 8.1 mg/dL — ABNORMAL LOW (ref 8.9–10.3)
Calcium: 8.1 mg/dL — ABNORMAL LOW (ref 8.9–10.3)
Chloride: 96 mmol/L — ABNORMAL LOW (ref 98–111)
Chloride: 102 mmol/L (ref 98–111)
Creatinine, Ser: 1.37 mg/dL — ABNORMAL HIGH (ref 0.61–1.24)
Creatinine, Ser: 1.36 mg/dL — ABNORMAL HIGH (ref 0.61–1.24)
GFR calc Af Amer: >60 mL/min (ref >60)
GFR calc Af Amer: >60 mL/min (ref >60)
GFR calc non Af Amer: 56 mL/min — ABNORMAL LOW (ref >60)
GFR calc non Af Amer: 57 mL/min — ABNORMAL LOW (ref >60)
Glucose, Bld: 134 mg/dL — ABNORMAL HIGH (ref 70–99)
Glucose, Bld: 153 mg/dL — ABNORMAL HIGH (ref 70–99)
Potassium: 3.2 mmol/L — ABNORMAL LOW (ref 3.5–5.1)
Potassium: 3.7 mmol/L (ref 3.5–5.1)
Sodium: 133 mmol/L — ABNORMAL LOW (ref 135–145)
Sodium: 137 mmol/L (ref 135–145)

## 2019-07-18 LAB — GLUCOSE, CAPILLARY
Glucose-Capillary: 145 mg/dL — ABNORMAL HIGH (ref 70–99)
Glucose-Capillary: 150 mg/dL — ABNORMAL HIGH (ref 70–99)
Glucose-Capillary: 179 mg/dL — ABNORMAL HIGH (ref 70–99)
Glucose-Capillary: 192 mg/dL — ABNORMAL HIGH (ref 70–99)
Glucose-Capillary: 202 mg/dL — ABNORMAL HIGH (ref 70–99)

## 2019-07-18 LAB — URINALYSIS, COMPLETE (UACMP) WITH MICROSCOPIC
Bacteria, UA: NONE SEEN
Bilirubin Urine: NEGATIVE
Glucose, UA: NEGATIVE mg/dL
Ketones, ur: NEGATIVE mg/dL
Leukocytes,Ua: NEGATIVE
Nitrite: NEGATIVE
Protein, ur: 100 mg/dL — AB
Specific Gravity, Urine: 1.005 (ref 1.005–1.030)
pH: 7 (ref 5.0–8.0)

## 2019-07-18 LAB — C DIFFICILE QUICK SCREEN W PCR REFLEX
C Diff antigen: NEGATIVE
C Diff interpretation: NOT DETECTED
C Diff toxin: NEGATIVE

## 2019-07-18 LAB — GASTROINTESTINAL PANEL BY PCR, STOOL (REPLACES STOOL CULTURE)
Adenovirus F40/41: NOT DETECTED
Astrovirus: NOT DETECTED
Campylobacter species: NOT DETECTED
Cryptosporidium: NOT DETECTED
Cyclospora cayetanensis: NOT DETECTED
Entamoeba histolytica: NOT DETECTED
Enteroaggregative E coli (EAEC): NOT DETECTED
Enteropathogenic E coli (EPEC): DETECTED — AB
Enterotoxigenic E coli (ETEC): DETECTED — AB
Giardia lamblia: NOT DETECTED
Norovirus GI/GII: NOT DETECTED
Plesimonas shigelloides: NOT DETECTED
Rotavirus A: NOT DETECTED
Salmonella species: NOT DETECTED
Sapovirus (I, II, IV, and V): NOT DETECTED
Shiga like toxin producing E coli (STEC): NOT DETECTED
Shigella/Enteroinvasive E coli (EIEC): NOT DETECTED
Vibrio cholerae: NOT DETECTED
Vibrio species: NOT DETECTED
Yersinia enterocolitica: NOT DETECTED

## 2019-07-18 LAB — CBC
HCT: 38.3 % — ABNORMAL LOW (ref 39.0–52.0)
Hemoglobin: 13.1 g/dL (ref 13.0–17.0)
MCH: 31.4 pg (ref 26.0–34.0)
MCHC: 34.2 g/dL (ref 30.0–36.0)
MCV: 91.8 fL (ref 80.0–100.0)
Platelets: 187 10*3/uL (ref 150–400)
RBC: 4.17 MIL/uL — ABNORMAL LOW (ref 4.22–5.81)
RDW: 13.8 % (ref 11.5–15.5)
WBC: 3.8 10*3/uL — ABNORMAL LOW (ref 4.0–10.5)
nRBC: 0 % (ref 0.0–0.2)

## 2019-07-18 LAB — TSH: TSH: 1.757 u[IU]/mL (ref 0.350–4.500)

## 2019-07-18 LAB — LACTOFERRIN, FECAL, QUALITATIVE: Lactoferrin, Fecal, Qual: POSITIVE — AB

## 2019-07-18 LAB — PHOSPHORUS: Phosphorus: 2.9 mg/dL (ref 2.5–4.6)

## 2019-07-18 LAB — MAGNESIUM: Magnesium: 1.3 mg/dL — ABNORMAL LOW (ref 1.7–2.4)

## 2019-07-18 MED ORDER — INSULIN ASPART 100 UNIT/ML ~~LOC~~ SOLN
0.0000 [IU] | Freq: Three times a day (TID) | SUBCUTANEOUS | Status: DC
  Administered 2019-07-18: 2 [IU] via SUBCUTANEOUS
  Administered 2019-07-18 (×2): 1 [IU] via SUBCUTANEOUS
  Administered 2019-07-19: 2 [IU] via SUBCUTANEOUS
  Administered 2019-07-19: 1 [IU] via SUBCUTANEOUS
  Administered 2019-07-20: 3 [IU] via SUBCUTANEOUS
  Administered 2019-07-20: 2 [IU] via SUBCUTANEOUS
  Administered 2019-07-21 (×2): 1 [IU] via SUBCUTANEOUS
  Administered 2019-07-21: 2 [IU] via SUBCUTANEOUS

## 2019-07-18 MED ORDER — POTASSIUM CHLORIDE 10 MEQ/100ML IV SOLN
10.0000 meq | INTRAVENOUS | Status: AC
  Administered 2019-07-18 (×4): 10 meq via INTRAVENOUS
  Filled 2019-07-18 (×3): qty 100

## 2019-07-18 MED ORDER — GABAPENTIN 300 MG PO CAPS
600.0000 mg | ORAL_CAPSULE | Freq: Two times a day (BID) | ORAL | Status: DC
  Administered 2019-07-18 – 2019-07-22 (×9): 600 mg via ORAL
  Filled 2019-07-18 (×9): qty 2

## 2019-07-18 MED ORDER — IPRATROPIUM-ALBUTEROL 0.5-2.5 (3) MG/3ML IN SOLN: 3.0000 mL | Freq: Four times a day (QID) | RESPIRATORY_TRACT | Status: DC | PRN

## 2019-07-18 MED ORDER — HYDROCODONE-ACETAMINOPHEN 5-325 MG PO TABS
1.0000 | ORAL_TABLET | Freq: Four times a day (QID) | ORAL | Status: DC | PRN
  Administered 2019-07-18 – 2019-07-19 (×8): 2 via ORAL
  Administered 2019-07-20: 1 via ORAL
  Administered 2019-07-20 – 2019-07-22 (×6): 2 via ORAL
  Filled 2019-07-18 (×7): qty 2
  Filled 2019-07-18: qty 1
  Filled 2019-07-18 (×7): qty 2

## 2019-07-18 MED ORDER — METRONIDAZOLE IN NACL 5-0.79 MG/ML-% IV SOLN
500.0000 mg | Freq: Three times a day (TID) | INTRAVENOUS | Status: DC
  Administered 2019-07-18 – 2019-07-19 (×3): 500 mg via INTRAVENOUS
  Filled 2019-07-18 (×3): qty 100

## 2019-07-18 MED ORDER — ADULT MULTIVITAMIN W/MINERALS CH
1.0000 | ORAL_TABLET | Freq: Every day | ORAL | Status: DC
  Administered 2019-07-20 – 2019-07-22 (×3): 1 via ORAL
  Filled 2019-07-18 (×5): qty 1

## 2019-07-18 MED ORDER — DULOXETINE HCL 60 MG PO CPEP
60.0000 mg | ORAL_CAPSULE | Freq: Every day | ORAL | Status: DC
  Administered 2019-07-18 – 2019-07-22 (×5): 60 mg via ORAL
  Filled 2019-07-18 (×5): qty 1

## 2019-07-18 MED ORDER — FUROSEMIDE 10 MG/ML IJ SOLN
80.0000 mg | Freq: Three times a day (TID) | INTRAMUSCULAR | Status: DC
  Administered 2019-07-18 (×2): 80 mg via INTRAVENOUS
  Filled 2019-07-18 (×2): qty 8

## 2019-07-18 MED ORDER — LOPERAMIDE HCL 2 MG PO CAPS
2.0000 mg | ORAL_CAPSULE | Freq: Once | ORAL | Status: AC
  Administered 2019-07-18: 2 mg via ORAL
  Filled 2019-07-18: qty 1

## 2019-07-18 MED ORDER — VITAMIN B-1 100 MG PO TABS
100.0000 mg | ORAL_TABLET | Freq: Every day | ORAL | Status: DC
  Administered 2019-07-19 – 2019-07-22 (×4): 100 mg via ORAL
  Filled 2019-07-18 (×5): qty 1

## 2019-07-18 MED ORDER — APIXABAN 5 MG PO TABS
5.0000 mg | ORAL_TABLET | Freq: Two times a day (BID) | ORAL | Status: DC
  Administered 2019-07-18 – 2019-07-22 (×10): 5 mg via ORAL
  Filled 2019-07-18 (×10): qty 1

## 2019-07-18 MED ORDER — MIRTAZAPINE 15 MG PO TABS
15.0000 mg | ORAL_TABLET | Freq: Every day | ORAL | Status: DC
  Administered 2019-07-18 – 2019-07-21 (×5): 15 mg via ORAL
  Filled 2019-07-18 (×5): qty 1

## 2019-07-18 MED ORDER — POTASSIUM CHLORIDE CRYS ER 20 MEQ PO TBCR
40.0000 meq | EXTENDED_RELEASE_TABLET | ORAL | Status: AC
  Administered 2019-07-18 (×2): 40 meq via ORAL
  Filled 2019-07-18 (×2): qty 2

## 2019-07-18 MED ORDER — MAGNESIUM SULFATE 50 % IJ SOLN
3.0000 g | Freq: Once | INTRAVENOUS | Status: AC
  Administered 2019-07-18: 3 g via INTRAVENOUS
  Filled 2019-07-18: qty 6

## 2019-07-18 MED ORDER — SODIUM CHLORIDE 0.9 % IV SOLN
INTRAVENOUS | Status: DC | PRN
  Administered 2019-07-18 – 2019-07-20 (×2): 250 mL via INTRAVENOUS

## 2019-07-18 MED ORDER — PANTOPRAZOLE SODIUM 40 MG PO TBEC
40.0000 mg | DELAYED_RELEASE_TABLET | Freq: Every day | ORAL | Status: DC
  Administered 2019-07-18 – 2019-07-22 (×5): 40 mg via ORAL
  Filled 2019-07-18 (×5): qty 1

## 2019-07-18 MED ORDER — METHOCARBAMOL 500 MG PO TABS
500.0000 mg | ORAL_TABLET | Freq: Two times a day (BID) | ORAL | Status: DC
  Administered 2019-07-18 – 2019-07-22 (×9): 500 mg via ORAL
  Filled 2019-07-18 (×9): qty 1

## 2019-07-18 MED ORDER — ATORVASTATIN CALCIUM 40 MG PO TABS
40.0000 mg | ORAL_TABLET | Freq: Every day | ORAL | Status: DC
  Administered 2019-07-18 – 2019-07-22 (×5): 40 mg via ORAL
  Filled 2019-07-18 (×5): qty 1

## 2019-07-18 MED ORDER — FOLIC ACID 1 MG PO TABS
6.0000 mg | ORAL_TABLET | Freq: Every day | ORAL | Status: DC
  Administered 2019-07-19 – 2019-07-22 (×4): 6 mg via ORAL
  Filled 2019-07-18 (×6): qty 6

## 2019-07-18 MED ORDER — DILTIAZEM HCL 30 MG PO TABS
30.0000 mg | ORAL_TABLET | Freq: Two times a day (BID) | ORAL | Status: DC
  Administered 2019-07-18 – 2019-07-22 (×9): 30 mg via ORAL
  Filled 2019-07-18 (×9): qty 1

## 2019-07-18 MED ORDER — NICOTINE 14 MG/24HR TD PT24
14.0000 mg | MEDICATED_PATCH | Freq: Every day | TRANSDERMAL | Status: DC
  Administered 2019-07-18 – 2019-07-22 (×5): 14 mg via TRANSDERMAL
  Filled 2019-07-18 (×5): qty 1

## 2019-07-18 MED ORDER — FERROUS SULFATE 325 (65 FE) MG PO TABS
325.0000 mg | ORAL_TABLET | Freq: Every day | ORAL | Status: DC
  Administered 2019-07-18 – 2019-07-22 (×5): 325 mg via ORAL
  Filled 2019-07-18 (×5): qty 1

## 2019-07-18 MED ORDER — LEVOFLOXACIN IN D5W 500 MG/100ML IV SOLN
500.0000 mg | INTRAVENOUS | Status: AC
  Administered 2019-07-18 – 2019-07-20 (×3): 500 mg via INTRAVENOUS
  Filled 2019-07-18 (×3): qty 100

## 2019-07-18 MED ORDER — INSULIN ASPART 100 UNIT/ML ~~LOC~~ SOLN
0.0000 [IU] | Freq: Every day | SUBCUTANEOUS | Status: DC
  Administered 2019-07-18: 2 [IU] via SUBCUTANEOUS

## 2019-07-18 MED ORDER — FUROSEMIDE 10 MG/ML IJ SOLN
80.0000 mg | Freq: Two times a day (BID) | INTRAMUSCULAR | Status: DC
  Administered 2019-07-18: 80 mg via INTRAVENOUS
  Filled 2019-07-18: qty 8

## 2019-07-18 MED ORDER — HYDRALAZINE HCL 25 MG PO TABS
25.0000 mg | ORAL_TABLET | Freq: Two times a day (BID) | ORAL | Status: DC
  Administered 2019-07-18 – 2019-07-19 (×3): 25 mg via ORAL
  Filled 2019-07-18 (×3): qty 1

## 2019-07-18 NOTE — Progress Notes (Addendum)
[facility] PROGRESS NOTE  Ioan Landini KGU:[id number] DOB: 07-03-1961 DOA: 07/17/2019 PCP: Charlott Rakes, MD  Brief summary   58 year old African-American male with past medical history significant for documented nonischemic cardiomyopathy/chronic combined systolic and diastolic CHF with likely component of right-sided heart failure, with EF of 30 to 35%, moderately decreased right ventricular systolic function, hypertension, noncompliance, drug abuse, hyperlipidemia, hep C, GERD, alcohol abuse, diabetes mellitus, depression, chronic kidney disease stage III, liver cirrhosis, atrial flutter, chronic chest pain, presents with 3-day history of persistent significant bilateral leg edema.   - Patient reports 1 week history of watery diarrhea.  There is associated vague abdominal pain.  Patient had nausea initially, but now for now.  No vomiting.  Potassium of 2.8 is noted on presentation.  Serum creatinine is at baseline.  Albumin is 2.6.  No significant drop in patient's hemoglobin.  Patient be admitted for further assessment and management.   ED Course: On presentation to the hospital, temperature was 99.8, heart rate of 109 bpm, respiratory rate of 21, blood pressure 140/118 mmHg and O2 sat of 99%.  Sodium is 129, potassium of 2.8, chloride of 94, BUN of 16, creatinine of 1.27.  Blood sugar is 121.  Fecal occult blood is negative.   Assessment/Plan:  Acute on chronic combined systolic and diastolic CHF with likely component of right-sided heart failure and chronic liver cirrhosis related edema. Low albumin. Echo (05/2019). EF 30-35%  -cont IV Lasix 80 mg, decrease BID due to diarrhea related volume loss. Monitor I/o, daily weight, urine output. Monitor renal function and electrolytes closely and correct  Hypokalemia, hypomagnesemia. : Likely secondary to combined diuretic use and diarrhea. Monitor closely and replete.  Diarrhea: Patient has had diarrhea for 1 week. c diff  -negative. Pend Gastrointestinal PCR. Monitor   Hypertension: on cardizem, hydralzine. monitor   Diabetes mellitus: ha1c-7.3. monitor on ISS  COPD: no acute wheezing. cont bronchodilators   History of illicit drug use: Repeat urine drug screening.pend   History  of A flutter. On cardizem, apixaban. Monitor   Code Status: full Family Communication: d/w patient, RN (indicate person spoken with, relationship, and if by phone, the number) Disposition Plan: cont inpatient    Consultants:  none  Procedures:  none  Antibiotics: Anti-infectives (From admission, onward)   None        (indicate start date, and stop date if known)  HPI/Subjective: Reports diarrhea. No acute abdominal pains no SOB  Objective: Vitals:   07/18/19 0650 07/18/19 0742  BP: (!) 132/94 (!) 135/97  Pulse: (!) 101 96  Resp: 18 18  Temp: 98.1 F (36.7 C) 98.7 F (37.1 C)  SpO2: 97% 96%    Intake/Output Summary (Last 24 hours) at 07/18/2019 0959 Last data filed at 07/18/2019 0900 Gross per 24 hour  Intake 1754.54 ml  Output 2050 ml  Net -295.46 ml   Filed Weights   07/18/19 0012  Weight: 82.9 kg    Exam:   General:  No distress   Cardiovascular: s1,s2 rrr  Respiratory: diminished at bases   Abdomen: soft, non tender. No rebound   Musculoskeletal: ankle edema    Data Reviewed: Basic Metabolic Panel: Recent Labs  Lab 07/11/19 1225 07/16/19 1513 07/17/19 1823 07/18/19 0242  NA 128* 130* 129* 133*  K 3.2* 2.9* 2.8* 3.2*  CL 89* 93* 94* 96*  CO2 28 25 22 26   GLUCOSE 140* 151* 121* 134*  BUN 12 13 16 19   CREATININE 1.28* 1.33* 1.27* 1.37*  CALCIUM 8.0* 7.7*  8.0* 8.1*  MG  --   --  1.4* 1.3*  PHOS  --   --   --  2.9   Liver Function Tests: Recent Labs  Lab 07/17/19 1823  AST 35  ALT 29  ALKPHOS 58  BILITOT 1.0  PROT 6.4*  ALBUMIN 2.6*   Recent Labs  Lab 07/17/19 1823  LIPASE 38   No results for input(s): AMMONIA in the last 168 hours. CBC: Recent Labs   Lab 07/11/19 1225 07/16/19 1513 07/17/19 1823 07/18/19 0242  WBC 2.9* 2.9* 4.4 3.8*  NEUTROABS  --   --  3.1  --   HGB 12.8* 12.6* 13.8 13.1  HCT 36.6* 36.2* 40.3 38.3*  MCV 90.6 90.5 92.0 91.8  PLT 202 180 192 187   Cardiac Enzymes: No results for input(s): CKTOTAL, CKMB, CKMBINDEX, TROPONINI in the last 168 hours. BNP (last 3 results) Recent Labs    07/04/19 1345 07/11/19 1225 07/17/19 1823  BNP 1,694.1* 836.4* 1,998.2*    ProBNP (last 3 results) No results for input(s): PROBNP in the last 8760 hours.  CBG: Recent Labs  Lab 07/18/19 0025 07/18/19 0646  GLUCAP 202* 192*    Recent Results (from the past 240 hour(s))  SARS Coronavirus 2 (CEPHEID - Performed in [place] hospital lab), Hosp Order     Status: None   Collection Time: 07/17/19  8:26 PM   Specimen: Nasopharyngeal Swab  Result Value Ref Range Status   SARS Coronavirus 2 NEGATIVE NEGATIVE Final    Comment: (NOTE) If result is NEGATIVE SARS-CoV-2 target nucleic acids are NOT DETECTED. The SARS-CoV-2 RNA is generally detectable in upper and lower  respiratory specimens during the acute phase of infection. The lowest  concentration of SARS-CoV-2 viral copies this assay can detect is 250  copies / mL. A negative result does not preclude SARS-CoV-2 infection  and should not be used as the sole basis for treatment or other  patient management decisions.  A negative result may occur with  improper specimen collection / handling, submission of specimen other  than nasopharyngeal swab, presence of viral mutation(s) within the  areas targeted by this assay, and inadequate number of viral copies  (<250 copies / mL). A negative result must be combined with clinical  observations, patient history, and epidemiological information. If result is POSITIVE SARS-CoV-2 target nucleic acids are DETECTED. The SARS-CoV-2 RNA is generally detectable in upper and lower  respiratory specimens dur ing the acute phase of  infection.  Positive  results are indicative of active infection with SARS-CoV-2.  Clinical  correlation with patient history and other diagnostic information is  necessary to determine patient infection status.  Positive results do  not rule out bacterial infection or co-infection with other viruses. If result is PRESUMPTIVE POSTIVE SARS-CoV-2 nucleic acids MAY BE PRESENT.   A presumptive positive result was obtained on the submitted specimen  and confirmed on repeat testing.  While 2019 novel coronavirus  (SARS-CoV-2) nucleic acids may be present in the submitted sample  additional confirmatory testing may be necessary for epidemiological  and / or clinical management purposes  to differentiate between  SARS-CoV-2 and other Sarbecovirus currently known to infect humans.  If clinically indicated additional testing with an alternate test  methodology [phone]) is advised. The SARS-CoV-2 RNA is generally  detectable in upper and lower respiratory sp ecimens during the acute  phase of infection. The expected result is Negative. Fact Sheet for Patients:  StrictlyIdeas.no Fact Sheet for Healthcare Providers: BankingDealers.co.za This  test is not yet approved or cleared by the [place] and has been authorized for detection and/or diagnosis of SARS-CoV-2 by FDA under an Emergency Use Authorization (EUA).  This EUA will remain in effect (meaning this test can be used) for the duration of the COVID-19 declaration under Section 564(b)(1) of the Act, 21 U.S.C. section 360bbb-3(b)(1), unless the authorization is terminated or revoked sooner. Performed at [facility], [address] [place], [place] [address]   C difficile quick scan w PCR reflex     Status: None   Collection Time: 07/18/19 12:16 AM   Specimen: Stool  Result Value Ref Range Status   C Diff antigen NEGATIVE NEGATIVE Final   C Diff toxin NEGATIVE NEGATIVE Final    C Diff interpretation No C. difficile detected.  Final    Comment: Performed at [facility], [place] [address] [place], [place] [address]     Studies: Dg Chest 2 View  Result Date: 07/17/2019 CLINICAL DATA:  Shortness of breath. Lower extremity edema. Congestive heart failure. Cirrhosis. EXAM: CHEST - 2 VIEW COMPARISON:  07/16/2019 FINDINGS: Stable moderate cardiomegaly. Stable tiny left pleural effusion versus pleural thickening. No evidence of pulmonary infiltrate or edema. No significant change compared to prior study. IMPRESSION: Stable cardiomegaly and tiny left pleural effusion versus pleural thickening. No acute findings. Electronically Signed   By: Marlaine Hind M.D.   On: 07/17/2019 17:52   Dg Chest 2 View  Result Date: 07/16/2019 CLINICAL DATA:  Pt complains of central chest pain and BLE swelling x several days; reports hx of CHF,DM, HTN, and atrial flutter; smoker EXAM: CHEST - 2 VIEW COMPARISON:  20/20 20 FINDINGS: The heart is enlarged. No focal consolidations. Small LEFT pleural effusion. No pulmonary edema. IMPRESSION: 1. Cardiomegaly. 2. Small LEFT pleural effusion. Electronically Signed   By: Nolon Nations M.D.   On: 07/16/2019 15:54    Scheduled Meds: . acetaminophen  650 mg Oral Once  . apixaban  5 mg Oral BID  . atorvastatin  40 mg Oral Daily  . diltiazem  30 mg Oral BID  . DULoxetine  60 mg Oral Daily  . ferrous sulfate  325 mg Oral Q breakfast  . folic acid  6 mg Oral Daily  . furosemide  80 mg Intravenous Q8H  . gabapentin  600 mg Oral BID  . hydrALAZINE  25 mg Oral BID  . insulin aspart  0-5 Units Subcutaneous QHS  . insulin aspart  0-9 Units Subcutaneous TID WC  . methocarbamol  500 mg Oral BID  . mirtazapine  15 mg Oral QHS  . multivitamin with minerals  1 tablet Oral Daily  . nicotine  14 mg Transdermal Daily  . pantoprazole  40 mg Oral Daily  . thiamine  100 mg Oral Daily   Continuous Infusions:  Active Problems:   Acute on chronic left systolic  heart failure ([place])    Time spent: >35 minutes     Kinnie Feil  [facility] Pager [phone]. If 7PM-7AM, please contact night-coverage at [email or web address], password [facility] 07/18/2019, 9:59 AM  LOS: 1 day

## 2019-07-18 NOTE — Plan of Care (Signed)
  Problem: Education: Goal: Knowledge of General Education information will improve Description Including pain rating scale, medication(s)/side effects and non-pharmacologic comfort measures Outcome: Progressing   Problem: Activity: Goal: Risk for activity intolerance will decrease Outcome: Progressing   Problem: Safety: Goal: Ability to remain free from injury will improve Outcome: Progressing   

## 2019-07-18 NOTE — Progress Notes (Signed)
Follow up call placed to [name], EMT with [facility] to discuss housing options for patient. According to her, patient recently sent texts eluding to suicidal ideations noting a plan was not mentioned.   Ms. Krontz reports that patient is still experiencing chronic homelessness. He did not follow through with returning to the [facility] and there is no availability at [facility]. Pt is interested in substance use treatment; however, [facility] is unable to admit him while prescribed insulin.   Ms. Dehart continues to strongly encourage medication compliance to better manage chronic medical conditions.   Pt is currently admitted in hospital. LCSW left message for Westley Hummer [phone] for a return call

## 2019-07-18 NOTE — Discharge Instructions (Signed)

## 2019-07-18 NOTE — Progress Notes (Signed)
Pt. Stated he had recent fall at [facility] in bathroom. Pt. Informed that he is a high fall risk and encouraged by RN to use bed alarm to alert staff when he needs assistance with ambulating. Pt. Refused bed alarm. Pt. States he will call for assistance. Call light within reach.

## 2019-07-19 ENCOUNTER — Other Ambulatory Visit: Payer: Self-pay

## 2019-07-19 LAB — BASIC METABOLIC PANEL
Anion gap: 8 (ref 5–15)
BUN: 18 mg/dL (ref 6–20)
CO2: 26 mmol/L (ref 22–32)
Calcium: 8.2 mg/dL — ABNORMAL LOW (ref 8.9–10.3)
Chloride: 101 mmol/L (ref 98–111)
Creatinine, Ser: 1.5 mg/dL — ABNORMAL HIGH (ref 0.61–1.24)
GFR calc Af Amer: 58 mL/min — ABNORMAL LOW (ref >60)
GFR calc non Af Amer: 50 mL/min — ABNORMAL LOW (ref >60)
Glucose, Bld: 133 mg/dL — ABNORMAL HIGH (ref 70–99)
Potassium: 3.8 mmol/L (ref 3.5–5.1)
Sodium: 135 mmol/L (ref 135–145)

## 2019-07-19 LAB — GLUCOSE, CAPILLARY
Glucose-Capillary: 140 mg/dL — ABNORMAL HIGH (ref 70–99)
Glucose-Capillary: 160 mg/dL — ABNORMAL HIGH (ref 70–99)
Glucose-Capillary: 120 mg/dL — ABNORMAL HIGH (ref 70–99)
Glucose-Capillary: 229 mg/dL — ABNORMAL HIGH (ref 70–99)

## 2019-07-19 LAB — MAGNESIUM: Magnesium: 1.5 mg/dL — ABNORMAL LOW (ref 1.7–2.4)

## 2019-07-19 MED ORDER — FUROSEMIDE 10 MG/ML IJ SOLN
60.0000 mg | Freq: Two times a day (BID) | INTRAMUSCULAR | Status: DC
  Administered 2019-07-19: 60 mg via INTRAVENOUS
  Filled 2019-07-19: qty 6

## 2019-07-19 MED ORDER — MAGNESIUM SULFATE 4 GM/100ML IV SOLN
4.0000 g | Freq: Once | INTRAVENOUS | Status: AC
  Administered 2019-07-19: 4 g via INTRAVENOUS
  Filled 2019-07-19: qty 100

## 2019-07-19 MED ORDER — FUROSEMIDE 10 MG/ML IJ SOLN
60.0000 mg | Freq: Two times a day (BID) | INTRAMUSCULAR | Status: AC
  Administered 2019-07-19: 60 mg via INTRAVENOUS
  Filled 2019-07-19: qty 6

## 2019-07-19 NOTE — Plan of Care (Signed)

## 2019-07-19 NOTE — Progress Notes (Signed)
[facility] PROGRESS NOTE  Brad [name] GMW:[id number] DOB: [birth year] DOA: 07/17/2019 PCP: Charlott Rakes, MD  Brief summary   58 year old African-American male with past medical history significant for documented nonischemic cardiomyopathy/chronic combined systolic and diastolic CHF with likely component of right-sided heart failure, with EF of 30 to 35%, moderately decreased right ventricular systolic function, ckd, hypertension, noncompliance, drug abuse, hyperlipidemia, hep C, GERD, alcohol abuse, diabetes mellitus, depression, chronic kidney disease stage III, liver cirrhosis, atrial flutter, chronic chest pain, presents with 3-day history of persistent significant bilateral leg edema.   - Patient reports 1 week history of watery diarrhea.  There is associated vague abdominal pain.  Patient had nausea initially, but now for now.  No vomiting.  Potassium of 2.8 is noted on presentation.  Serum creatinine is at baseline.  Albumin is 2.6.  No significant drop in patient's hemoglobin.  Patient be admitted for further assessment and management.   ED Course: On presentation to the hospital, temperature was 99.8, heart rate of 109 bpm, respiratory rate of 21, blood pressure 140/118 mmHg and O2 sat of 99%.  Sodium is 129, potassium of 2.8, chloride of 94, BUN of 16, creatinine of 1.27.  Blood sugar is 121.  Fecal occult blood is negative.   Assessment/Plan:  Acute on chronic combined systolic and diastolic CHF with likely component of right-sided heart failure and chronic liver cirrhosis related edema. Low albumin. Echo (05/2019). EF 30-35%  -significant improved IV Lasix 80 mg (TID) on admission, decreased to 60 BID due to diarrhea related volume loss. Monitor I/o, daily weight, urine output. Monitor renal function and electrolytes closely and correct. Possible transition to PO regimen tomorrow   Hypokalemia, hypomagnesemia. Likely secondary to combined diuretic use and diarrhea.  Monitor closely and replete as needed.  Severe Diarrhea due to e coli: Patient has had diarrhea for 1 week. c diff -negative. Gastrointestinal PCR+e coli.  -improved with levofloxacin/metronidazol. Deescalate antibiotics tomorrow. Monitor   Hypertension: on cardizem, hydralzine. monitor   Diabetes mellitus: ha1c-7.3. monitor on ISS  COPD: no acute wheezing. cont bronchodilators   History of illicit drug use: Repeat urine drug screening.pend   History  of A flutter. On cardizem, apixaban. Monitor   CKD II. baseline creatinine is around 1.3-1.5. monitor on diuresis    Dispo: possible 24-48 hrs. consult SW Code Status: full Family Communication: d/w patient, RN (indicate person spoken with, relationship, and if by phone, the number) Disposition Plan: 24-48 hrs. homelessness. Consult SW.    Consultants:  none  Procedures:  none  Antibiotics: Anti-infectives (From admission, onward)   Start     Dose/Rate Route Frequency Ordered Stop   07/18/19 1600  metroNIDAZOLE (FLAGYL) IVPB 500 mg  Status:  Discontinued     500 mg 100 mL/hr over 60 Minutes Intravenous Every 8 hours 07/18/19 1432 07/19/19 0932   07/18/19 1600  levofloxacin (LEVAQUIN) IVPB 500 mg     500 mg 100 mL/hr over 60 Minutes Intravenous Every 24 hours 07/18/19 1432 07/20/19 2359       (indicate start date, and stop date if known)  HPI/Subjective: Reports feeling much better, diarrhea-improved. Leg swelling is improving. No acute abdominal pains no SOB  Objective: Vitals:   07/19/19 0558 07/19/19 0909  BP: 103/79 114/86  Pulse: 82 81  Resp: 18 16  Temp: 97.8 F (36.6 C) (!) 97.5 F (36.4 C)  SpO2: 98% 100%    Intake/Output Summary (Last 24 hours) at 07/19/2019 4403 Last data filed at 07/19/2019 0700 Gross per 24  hour  Intake 680.63 ml  Output 2100 ml  Net -1419.37 ml   Filed Weights   07/18/19 0012 07/19/19 0558  Weight: 82.9 kg 80.9 kg    Exam:   General:  No distress    Cardiovascular: s1,s2 rrr  Respiratory: diminished at bases   Abdomen: soft, non tender. No rebound   Musculoskeletal: ankle edema    Data Reviewed: Basic Metabolic Panel: Recent Labs  Lab 07/16/19 1513 07/17/19 1823 07/18/19 0242 07/18/19 1240 07/19/19 0640  NA 130* 129* 133* 137 135  K 2.9* 2.8* 3.2* 3.7 3.8  CL 93* 94* 96* 102 101  CO2 25 22 26 27 26   GLUCOSE 151* 121* 134* 153* 133*  BUN 13 16 19 19 18   CREATININE 1.33* 1.27* 1.37* 1.36* 1.50*  CALCIUM 7.7* 8.0* 8.1* 8.1* 8.2*  MG  --  1.4* 1.3*  --  1.5*  PHOS  --   --  2.9  --   --    Liver Function Tests: Recent Labs  Lab 07/17/19 1823  AST 35  ALT 29  ALKPHOS 58  BILITOT 1.0  PROT 6.4*  ALBUMIN 2.6*   Recent Labs  Lab 07/17/19 1823  LIPASE 38   No results for input(s): AMMONIA in the last 168 hours. CBC: Recent Labs  Lab 07/16/19 1513 07/17/19 1823 07/18/19 0242  WBC 2.9* 4.4 3.8*  NEUTROABS  --  3.1  --   HGB 12.6* 13.8 13.1  HCT 36.2* 40.3 38.3*  MCV 90.5 92.0 91.8  PLT 180 192 187   Cardiac Enzymes: No results for input(s): CKTOTAL, CKMB, CKMBINDEX, TROPONINI in the last 168 hours. BNP (last 3 results) Recent Labs    07/04/19 1345 07/11/19 1225 07/17/19 1823  BNP 1,694.1* 836.4* 1,998.2*    ProBNP (last 3 results) No results for input(s): PROBNP in the last 8760 hours.  CBG: Recent Labs  Lab 07/18/19 0646 07/18/19 1153 07/18/19 1624 07/18/19 2115 07/19/19 0553  GLUCAP 192* 145* 150* 179* 140*    Recent Results (from the past 240 hour(s))  SARS Coronavirus 2 (CEPHEID - Performed in [place] hospital lab), Hosp Order     Status: None   Collection Time: 07/17/19  8:26 PM   Specimen: Nasopharyngeal Swab  Result Value Ref Range Status   SARS Coronavirus 2 NEGATIVE NEGATIVE Final    Comment: (NOTE) If result is NEGATIVE SARS-CoV-2 target nucleic acids are NOT DETECTED. The SARS-CoV-2 RNA is generally detectable in upper and lower  respiratory specimens during the  acute phase of infection. The lowest  concentration of SARS-CoV-2 viral copies this assay can detect is 250  copies / mL. A negative result does not preclude SARS-CoV-2 infection  and should not be used as the sole basis for treatment or other  patient management decisions.  A negative result may occur with  improper specimen collection / handling, submission of specimen other  than nasopharyngeal swab, presence of viral mutation(s) within the  areas targeted by this assay, and inadequate number of viral copies  (<250 copies / mL). A negative result must be combined with clinical  observations, patient history, and epidemiological information. If result is POSITIVE SARS-CoV-2 target nucleic acids are DETECTED. The SARS-CoV-2 RNA is generally detectable in upper and lower  respiratory specimens dur ing the acute phase of infection.  Positive  results are indicative of active infection with SARS-CoV-2.  Clinical  correlation with patient history and other diagnostic information is  necessary to determine patient infection status.  Positive results  do  not rule out bacterial infection or co-infection with other viruses. If result is PRESUMPTIVE POSTIVE SARS-CoV-2 nucleic acids MAY BE PRESENT.   A presumptive positive result was obtained on the submitted specimen  and confirmed on repeat testing.  While 2019 novel coronavirus  (SARS-CoV-2) nucleic acids may be present in the submitted sample  additional confirmatory testing may be necessary for epidemiological  and / or clinical management purposes  to differentiate between  SARS-CoV-2 and other Sarbecovirus currently known to infect humans.  If clinically indicated additional testing with an alternate test  methodology [phone]) is advised. The SARS-CoV-2 RNA is generally  detectable in upper and lower respiratory sp ecimens during the acute  phase of infection. The expected result is Negative. Fact Sheet for Patients:   StrictlyIdeas.no Fact Sheet for Healthcare Providers: BankingDealers.co.za This test is not yet approved or cleared by the [place] FDA and has been authorized for detection and/or diagnosis of SARS-CoV-2 by FDA under an Emergency Use Authorization (EUA).  This EUA will remain in effect (meaning this test can be used) for the duration of the COVID-19 declaration under Section 564(b)(1) of the Act, 21 U.S.C. section 360bbb-3(b)(1), unless the authorization is terminated or revoked sooner. Performed at [facility], [place] [address], [place], [place] [address]   C difficile quick scan w PCR reflex     Status: None   Collection Time: 07/18/19 12:16 AM   Specimen: Stool  Result Value Ref Range Status   C Diff antigen NEGATIVE NEGATIVE Final   C Diff toxin NEGATIVE NEGATIVE Final   C Diff interpretation No C. difficile detected.  Final    Comment: Performed at [facility], [place] [address], [place], [place] [address]  Gastrointestinal Panel by PCR , Stool     Status: Abnormal   Collection Time: 07/18/19  1:45 AM   Specimen: Stool  Result Value Ref Range Status   Campylobacter species NOT DETECTED NOT DETECTED Final   Plesimonas shigelloides NOT DETECTED NOT DETECTED Final   Salmonella species NOT DETECTED NOT DETECTED Final   Yersinia enterocolitica NOT DETECTED NOT DETECTED Final   Vibrio species NOT DETECTED NOT DETECTED Final   Vibrio cholerae NOT DETECTED NOT DETECTED Final   Enteroaggregative E coli (EAEC) NOT DETECTED NOT DETECTED Final   Enteropathogenic E coli (EPEC) DETECTED (A) NOT DETECTED Final    Comment: RESULT CALLED TO, READ BACK BY AND VERIFIED WITH:  ANGELA EGLINGER AT 1330 07/18/2019 SDR    Enterotoxigenic E coli (ETEC) DETECTED (A) NOT DETECTED Final    Comment: RESULT CALLED TO, READ BACK BY AND VERIFIED WITH:  ANGELA EGLINGER AT 1330 07/18/2019 SDR    Shiga like toxin producing E coli (STEC) NOT  DETECTED NOT DETECTED Final   Shigella/Enteroinvasive E coli (EIEC) NOT DETECTED NOT DETECTED Final   Cryptosporidium NOT DETECTED NOT DETECTED Final   Cyclospora cayetanensis NOT DETECTED NOT DETECTED Final   Entamoeba histolytica NOT DETECTED NOT DETECTED Final   Giardia lamblia NOT DETECTED NOT DETECTED Final   Adenovirus F40/41 NOT DETECTED NOT DETECTED Final   Astrovirus NOT DETECTED NOT DETECTED Final   Norovirus GI/GII NOT DETECTED NOT DETECTED Final   Rotavirus A NOT DETECTED NOT DETECTED Final   Sapovirus (I, II, IV, and V) NOT DETECTED NOT DETECTED Final    Comment: Performed at [facility], [address], [place], [place] [address]     Studies: Dg Chest 2 View  Result Date: 07/17/2019 CLINICAL DATA:  Shortness of breath. Lower extremity edema. Congestive heart failure. Cirrhosis. EXAM: CHEST - 2 VIEW COMPARISON:  07/16/2019 FINDINGS: Stable moderate cardiomegaly. Stable tiny left pleural effusion versus pleural thickening. No evidence of pulmonary infiltrate or edema. No significant change compared to prior study. IMPRESSION: Stable cardiomegaly and tiny left pleural effusion versus pleural thickening. No acute findings. Electronically Signed   By: Marlaine Hind M.D.   On: 07/17/2019 17:52    Scheduled Meds: . acetaminophen  650 mg Oral Once  . apixaban  5 mg Oral BID  . atorvastatin  40 mg Oral Daily  . diltiazem  30 mg Oral BID  . DULoxetine  60 mg Oral Daily  . ferrous sulfate  325 mg Oral Q breakfast  . folic acid  6 mg Oral Daily  . furosemide  60 mg Intravenous BID  . gabapentin  600 mg Oral BID  . hydrALAZINE  25 mg Oral BID  . insulin aspart  0-9 Units Subcutaneous TID WC  . methocarbamol  500 mg Oral BID  . mirtazapine  15 mg Oral QHS  . multivitamin with minerals  1 tablet Oral Daily  . nicotine  14 mg Transdermal Daily  . pantoprazole  40 mg Oral Daily  . thiamine  100 mg Oral Daily   Continuous Infusions: . sodium chloride Stopped (07/19/19  0028)  . levofloxacin (LEVAQUIN) IV Stopped (07/18/19 1947)  . magnesium sulfate bolus IVPB      Active Problems:   Acute on chronic left systolic heart failure ([facility])    Time spent: >35 minutes     Kinnie Feil  [facility] Pager [phone]. If 7PM-7AM, please contact night-coverage at [email or web address], password [name] 07/19/2019, 9:39 AM  LOS: 2 days

## 2019-07-19 NOTE — TOC Initial Note (Signed)
Transition of Care [facility]) - Initial/Assessment Note    Patient Details  Name: Brad [name] MRN: [id number] Date of Birth: 03-19-1961  Transition of Care [facility]) CM/SW Contact:    Alexander Mt, [place] Phone Number: 07/19/2019, 2:05 PM  Clinical Narrative:                 CSW had received both consult and call from Christa See, [place] at [facility]. Return message left for [facility].  CSW called pt room phone to complete assessment and offer support.  Introduced self, role, reason for call. Pt currently with no permanent address- he had most recently been living at what he describes to be like a boarding house where the pt states the landlord put him out. Pt had stayed at a hotel with a friend but at this time has no stable shelter when he leaves- he has [facility] for over a year. Pt upset at discharge prior to this readmission and describes frustration at not having anywhere to go/being aware there are no shelter beds at [facility]. Pt does not want to go to [facility] due to distance from his appointments; and does not want to got to [facility] as he says he has been sober from substances for the past few months (and upon chart review cannot go to [facility] due to his insulin). Pt inquiring about hotel voucher program but this is full and not accepting additional applications at this time.  We discussed that CSW department currently does not have any additional shelter beds today that we are aware of and that although we know it may be difficult that it would be good for pt to reach out to any supports he has, including his children and friends to see if he could receive any assistance financially for short term shelter. CSW will leave hand off for CSW covering tomorrow to call [facility] and [facility] for any bed availability. Pt receives his "check" Friday and states he could possibly use that for hotel when he gets it.  Expected Discharge Plan: Homeless  Shelter Barriers to Discharge: Continued Medical Work up   Patient Goals and CMS Choice Patient states their goals for this hospitalization and ongoing recovery are:: to have a place to go CMS Medicare.gov Compare Post Acute Care list provided to:: (n/a) Choice offered to / list presented to : NA  Expected Discharge Plan and Services Expected Discharge Plan: Homeless Shelter In-house Referral: Clinical Social Work Discharge Planning Services: CM Consult, [facility]   Living arrangements for the past 2 months: [place], No permanent address   HH Arranged: NA          Prior Living Arrangements/Services Living arrangements for the past 2 months: [employer], No permanent address Lives with:: Self Patient language and need for interpreter reviewed:: Yes(no needs) Do you feel safe going back to the place where you live?: No   pt does not have a place to discharge to  Need for Family Participation in Patient Care: Yes (Comment)(assistance; support) Care giver support system in place?: No (comment)(no- pt has contact with his children and friends but no consisten support) Current home services: Other (comment)(n/a)    Activities of Daily Living Home Assistive Devices/Equipment: None ADL Screening (condition at time of admission) Patient's cognitive ability adequate to safely complete daily activities?: Yes Is the patient deaf or have difficulty hearing?: No Does the patient have difficulty seeing, even when wearing glasses/contacts?: No Does the patient  have difficulty concentrating, remembering, or making decisions?: No Patient able to express need for assistance with ADLs?: Yes Does the patient have difficulty dressing or bathing?: No Independently performs ADLs?: Yes (appropriate for developmental age) Does the patient have difficulty walking or climbing stairs?: No Weakness of Legs: None Weakness of Arms/Hands: None  Permission  Sought/Granted Permission sought to share information with : Case Manager, PCP Permission granted to share information with : Yes, Verbal Permission Granted  Share Information with NAME: Christa See  Permission granted to share info w AGENCY: [place] granted to share info w Relationship: social worker  Permission granted to share info w Contact Information: [phone]  Emotional Assessment Appearance:: Other (Comment Required(telephonic assessment complete) Attitude/Demeanor/Rapport: (telephonic assessment complete) Affect (typically observed): (telephonic assessment complete) Orientation: : Oriented to Self, Oriented to Place, Oriented to  Time, Oriented to Situation Alcohol / Substance Use: Illicit Drugs, Alcohol Use, Tobacco Use Psych Involvement: No (comment)  Admission diagnosis:  Hypokalemia [E87.6] Cardiac arrhythmia, unspecified cardiac arrhythmia type [I49.9] Diarrhea, unspecified type [R19.7] Acute on chronic congestive heart failure, unspecified heart failure type [facility]) [I50.9] Patient Active Problem List   Diagnosis Date Noted  . Acute on chronic left systolic heart failure ([place]) 07/17/2019  . Hypomagnesemia 06/19/2019  . Congestive heart failure (CHF) ([place]) 06/12/2019  . Chest pain 06/07/2019  . Acute hepatic encephalopathy 06/07/2019  . Acute exacerbation of CHF (congestive heart failure) ([place]) 06/06/2019  . Elevated troponin 05/23/2019  . Tobacco abuse 05/23/2019  . CHF exacerbation ([place]) 05/23/2019  . Acute on chronic systolic (congestive) heart failure ([place]) 02/22/2019  . Acute on chronic systolic congestive heart failure ([place]) 02/21/2019  . Alcohol abuse 02/21/2019  . Frequent falls 01/17/2019  . Severe mitral regurgitation   . Fall 11/01/2018  . Hyponatremia 10/31/2018  . Abdominal distention   . Acute systolic congestive heart failure ([place])   . Chronic atrial fibrillation   . Chronic hyponatremia 08/16/2018  . NSVT (nonsustained ventricular  tachycardia) ([place])   . Concussion with loss of consciousness   . Scalp laceration   . Trauma   . Permanent atrial fibrillation   . Hypertensive heart disease   . Shortness of breath   . ACS (acute coronary syndrome) ([place]) 07/25/2018  . Pyogenic inflammation of bone ([place])   . Ankle swelling, right 04/30/2018  . Cirrhosis ([place]) 03/23/2018  . Right ankle pain 03/23/2018  . Acute respiratory failure with hypoxia ([place]) 11/06/2017  . Syncope 08/07/2017  . Acute on chronic combined systolic and diastolic heart failure ([place]) 07/09/2017  . Atrial flutter ([place]) 07/09/2017  . Pre-syncope 07/08/2017  . Neuropathy 05/08/2017  . Substance induced mood disorder ([place]) 10/06/2016  . CVA (cerebral vascular accident) ([place]) 09/18/2016  . Left sided numbness   . Homelessness 08/21/2016  . S/P ORIF (open reduction internal fixation) fracture 08/01/2016  . CAD (coronary artery disease), native coronary artery 07/30/2016  . Surgery, elective   . Insomnia 07/22/2016  . Acute diastolic heart failure ([place])   . NSTEMI (non-ST elevated myocardial infarction) ([place])   . Anemia 07/05/2016  . Thrombocytopenia ([place]) 07/05/2016  . AKI (acute kidney injury) ([place])   . Cocaine abuse ([place]) 07/02/2016  . Chest pain on breathing 07/01/2016  . Essential hypertension 07/01/2016  . Type 2 diabetes mellitus with complication, without long-term current use of insulin ([place]) 07/01/2016  . Hypokalemia 07/01/2016  . CKD (chronic kidney disease) stage 3, GFR 30-59 ml/min (HCC) 07/01/2016  . Painful diabetic neuropathy ([place]) 07/01/2016  . Polysubstance abuse (  [place]) 05/27/2016  . Chronic hepatitis C with cirrhosis ([place]) 05/27/2016  . Chronic diastolic congestive heart failure ([place]) 01/31/2016  . Depression 04/21/2012  . GERD (gastroesophageal reflux disease) 02/16/2012  . History of drug abuse ([place])   . Heroin addiction ([place]) 01/29/2012   PCP:  Charlott Rakes, MD Pharmacy:   [place], [place] - [address] [place] [address] Phone: [phone] Fax: [phone]  [facility] [place] [place] [address] [place] [address] Phone: [phone] Fax: [phone]     Social Determinants of Health (SDOH) Interventions    Readmission Risk Interventions Readmission Risk Prevention Plan 05/28/2019  Transportation Screening Complete  Medication Review ([place]) Complete  PCP or Specialist appointment within 3-5 days of discharge Complete  HRI or [place] Complete  SW Recovery Care/Counseling Consult Complete  [place] Not Applicable  Some recent data might be hidden

## 2019-07-19 NOTE — Progress Notes (Signed)
Per Sherlynn Stalls in ID pt does not need enteric unless is in briefs or incotinintent, pt can have standard precautions

## 2019-07-20 ENCOUNTER — Telehealth: Payer: Self-pay | Admitting: Family Medicine

## 2019-07-20 LAB — BASIC METABOLIC PANEL
Anion gap: 8 (ref 5–15)
BUN: 19 mg/dL (ref 6–20)
CO2: 26 mmol/L (ref 22–32)
Calcium: 8.1 mg/dL — ABNORMAL LOW (ref 8.9–10.3)
Chloride: 100 mmol/L (ref 98–111)
Creatinine, Ser: 1.58 mg/dL — ABNORMAL HIGH (ref 0.61–1.24)
GFR calc Af Amer: 55 mL/min — ABNORMAL LOW (ref >60)
GFR calc non Af Amer: 47 mL/min — ABNORMAL LOW (ref >60)
Glucose, Bld: 133 mg/dL — ABNORMAL HIGH (ref 70–99)
Potassium: 3.2 mmol/L — ABNORMAL LOW (ref 3.5–5.1)
Sodium: 134 mmol/L — ABNORMAL LOW (ref 135–145)

## 2019-07-20 LAB — GLUCOSE, CAPILLARY
Glucose-Capillary: 120 mg/dL — ABNORMAL HIGH (ref 70–99)
Glucose-Capillary: 204 mg/dL — ABNORMAL HIGH (ref 70–99)
Glucose-Capillary: 159 mg/dL — ABNORMAL HIGH (ref 70–99)
Glucose-Capillary: 104 mg/dL — ABNORMAL HIGH (ref 70–99)

## 2019-07-20 MED ORDER — POTASSIUM CHLORIDE CRYS ER 20 MEQ PO TBCR
40.0000 meq | EXTENDED_RELEASE_TABLET | Freq: Once | ORAL | Status: AC
  Administered 2019-07-20: 40 meq via ORAL
  Filled 2019-07-20: qty 2

## 2019-07-20 MED ORDER — TORSEMIDE 20 MG PO TABS
60.0000 mg | ORAL_TABLET | Freq: Two times a day (BID) | ORAL | Status: DC
  Administered 2019-07-20 – 2019-07-22 (×4): 60 mg via ORAL
  Filled 2019-07-20 (×4): qty 3

## 2019-07-20 NOTE — Progress Notes (Signed)
Pharmacist Heart Failure Core Measure Documentation  Assessment: Brad [name] has an EF documented as 30-35%  Rationale: Heart failure patients with left ventricular systolic dysfunction (LVSD) and an EF < 40% should be prescribed an angiotensin converting enzyme inhibitor (ACEI) or angiotensin receptor blocker (ARB) at discharge unless a contraindication is documented in the medical record.  This patient is not currently on an ACEI or ARB for HF.  This note is being placed in the record in order to provide documentation that a contraindication to the use of these agents is present for this encounter.  ACE Inhibitor or Angiotensin Receptor Blocker is contraindicated (specify all that apply)  [x]   ACEI allergy AND ARB allergy []   Angioedema []   Moderate or severe aortic stenosis []   Hyperkalemia []   Hypotension []   Renal artery stenosis []   Worsening renal function, preexisting renal disease or dysfunction   [name], [name] 07/20/2019 9:37 AM

## 2019-07-20 NOTE — Progress Notes (Signed)
[facility] PROGRESS NOTE  Brad [name] VFI:[id number] DOB: 1961-04-07 DOA: 07/17/2019 PCP: Charlott Rakes, MD  Brief summary   58 year old African-American male with past medical history significant for documented nonischemic cardiomyopathy/chronic combined systolic and diastolic CHF with likely component of right-sided heart failure, with EF of 30 to 35%, moderately decreased right ventricular systolic function, ckd, hypertension, noncompliance, drug abuse, hyperlipidemia, hep C, GERD, alcohol abuse, diabetes mellitus, depression, chronic kidney disease stage III, liver cirrhosis, atrial flutter, chronic chest pain, presents with 3-day history of persistent significant bilateral leg edema.   - Patient reports 1 week history of watery diarrhea.  There is associated vague abdominal pain.  Patient had nausea initially, but now for now.  No vomiting.  Potassium of 2.8 is noted on presentation.  Serum creatinine is at baseline.  Albumin is 2.6.  No significant drop in patient's hemoglobin.  Patient be admitted for further assessment and management.   ED Course: On presentation to the hospital, temperature was 99.8, heart rate of 109 bpm, respiratory rate of 21, blood pressure 140/118 mmHg and O2 sat of 99%.  Sodium is 129, potassium of 2.8, chloride of 94, BUN of 16, creatinine of 1.27.  Blood sugar is 121.  Fecal occult blood is negative.  07/20/2019: Patient seen.  Patient looks much better today.  Edema has improved significantly.  Patient reports only 3 episodes of diarrhea stool today.  No fever or chills, no abdominal pain.  Patient's friend also has same symptoms.  Patient believes they go the E. coli from [place].  Disposition is being pursued.  Likely discharge once place of abode is arranged.   Assessment/Plan:  Acute on chronic combined systolic and diastolic CHF with likely component of right-sided heart failure and chronic liver cirrhosis related edema. Low albumin. Echo  (05/2019). EF 30-35%  -significant improved IV Lasix 80 mg (TID) on admission, decreased to 60 BID due to diarrhea related volume loss. Monitor I/o, daily weight, urine output. Monitor renal function and electrolytes closely and correct. Possible transition to PO regimen tomorrow  07/20/2019: Edema has improved significantly.  Continue diuretics for now.  Likely transition to oral diuretics.  Will resume home dose (torsemide 60 mg p.o. twice daily).  Hypokalemia, hypomagnesemia. Likely secondary to combined diuretic use and diarrhea. Monitor closely and replete as needed. 07/20/2019: Potassium is 3.2 today.  Last magnesium was yesterday (1.5).  Will replete potassium.  We will repeat levels in the morning.  Severe Diarrhea due to e coli: Patient has had diarrhea for 1 week. c diff -negative. Gastrointestinal PCR+e coli.  -improved with levofloxacin/metronidazol. Deescalate antibiotics tomorrow. Monitor  07/20/2019: Continue to monitor off antibiotics.  Hypertension:  on cardizem, hydralzine. monitor  07/20/2019: Blood pressure is optimized.  Last documented blood pressure was 120/83 mmHg.  Diabetes mellitus: ha1c-7.3. monitor on ISS  COPD: no acute wheezing. cont bronchodilators   History of illicit drug use: Repeat urine drug screening.pend   History  of A flutter. On cardizem, apixaban. Monitor   CKD II. baseline creatinine is around 1.3-1.5. monitor on diuresis 07/20/2019: Serum creatinine is 1.58 today.  Continue to monitor renal function closely.   Dispo: possible 24-48 hrs. consult SW Code Status: full Family Communication: d/w patient, RN (indicate person spoken with, relationship, and if by phone, the number) Disposition Plan: 24-48 hrs. homelessness. Consult SW.    Consultants:  none  Procedures:  none  Antibiotics: Anti-infectives (From admission, onward)   Start     Dose/Rate Route Frequency Ordered Stop   07/18/19  1600  metroNIDAZOLE (FLAGYL) IVPB 500 mg   Status:  Discontinued     500 mg 100 mL/hr over 60 Minutes Intravenous Every 8 hours 07/18/19 1432 07/19/19 0932   07/18/19 1600  levofloxacin (LEVAQUIN) IVPB 500 mg     500 mg 100 mL/hr over 60 Minutes Intravenous Every 24 hours 07/18/19 1432 07/20/19 2359       (indicate start date, and stop date if known)  HPI/Subjective: Reports feeling much better, diarrhea-improved. Leg swelling is improving. No acute abdominal pains no SOB  Objective: Vitals:   07/20/19 0446 07/20/19 0856  BP: 128/82 120/83  Pulse: 76 82  Resp: 16 18  Temp: (!) 97.3 F (36.3 C)   SpO2: 95%     Intake/Output Summary (Last 24 hours) at 07/20/2019 1622 Last data filed at 07/20/2019 0909 Gross per 24 hour  Intake 1600 ml  Output 601 ml  Net 999 ml   Filed Weights   07/18/19 0012 07/19/19 0558 07/20/19 0500  Weight: 82.9 kg 80.9 kg 78.7 kg    Exam:   General:  No distress   Cardiovascular: s1,s2  Respiratory: diminished at bases   Abdomen: soft, non tender. No rebound   Musculoskeletal: Mild ankle edema  Neuro: Patient is awake and alert.  Patient moves all limbs.  Data Reviewed: Basic Metabolic Panel: Recent Labs  Lab 07/17/19 1823 07/18/19 0242 07/18/19 1240 07/19/19 0640 07/20/19 0537  NA 129* 133* 137 135 134*  K 2.8* 3.2* 3.7 3.8 3.2*  CL 94* 96* 102 101 100  CO2 22 26 27 26 26   GLUCOSE 121* 134* 153* 133* 133*  BUN 16 19 19 18 19   CREATININE 1.27* 1.37* 1.36* 1.50* 1.58*  CALCIUM 8.0* 8.1* 8.1* 8.2* 8.1*  MG 1.4* 1.3*  --  1.5*  --   PHOS  --  2.9  --   --   --    Liver Function Tests: Recent Labs  Lab 07/17/19 1823  AST 35  ALT 29  ALKPHOS 58  BILITOT 1.0  PROT 6.4*  ALBUMIN 2.6*   Recent Labs  Lab 07/17/19 1823  LIPASE 38   No results for input(s): AMMONIA in the last 168 hours. CBC: Recent Labs  Lab 07/16/19 1513 07/17/19 1823 07/18/19 0242  WBC 2.9* 4.4 3.8*  NEUTROABS  --  3.1  --   HGB 12.6* 13.8 13.1  HCT 36.2* 40.3 38.3*  MCV 90.5 92.0  91.8  PLT 180 192 187   Cardiac Enzymes: No results for input(s): CKTOTAL, CKMB, CKMBINDEX, TROPONINI in the last 168 hours. BNP (last 3 results) Recent Labs    07/04/19 1345 07/11/19 1225 07/17/19 1823  BNP 1,694.1* 836.4* 1,998.2*    ProBNP (last 3 results) No results for input(s): PROBNP in the last 8760 hours.  CBG: Recent Labs  Lab 07/19/19 1246 07/19/19 1617 07/19/19 2151 07/20/19 0632 07/20/19 1139  GLUCAP 160* 120* 229* 120* 204*    Recent Results (from the past 240 hour(s))  SARS Coronavirus 2 (CEPHEID - Performed in [facility] hospital lab), Hosp Order     Status: None   Collection Time: 07/17/19  8:26 PM   Specimen: Nasopharyngeal Swab  Result Value Ref Range Status   SARS Coronavirus 2 NEGATIVE NEGATIVE Final    Comment: (NOTE) If result is NEGATIVE SARS-CoV-2 target nucleic acids are NOT DETECTED. The SARS-CoV-2 RNA is generally detectable in upper and lower  respiratory specimens during the acute phase of infection. The lowest  concentration of SARS-CoV-2 viral copies this  assay can detect is 250  copies / mL. A negative result does not preclude SARS-CoV-2 infection  and should not be used as the sole basis for treatment or other  patient management decisions.  A negative result may occur with  improper specimen collection / handling, submission of specimen other  than nasopharyngeal swab, presence of viral mutation(s) within the  areas targeted by this assay, and inadequate number of viral copies  (<250 copies / mL). A negative result must be combined with clinical  observations, patient history, and epidemiological information. If result is POSITIVE SARS-CoV-2 target nucleic acids are DETECTED. The SARS-CoV-2 RNA is generally detectable in upper and lower  respiratory specimens dur ing the acute phase of infection.  Positive  results are indicative of active infection with SARS-CoV-2.  Clinical  correlation with patient history and other  diagnostic information is  necessary to determine patient infection status.  Positive results do  not rule out bacterial infection or co-infection with other viruses. If result is PRESUMPTIVE POSTIVE SARS-CoV-2 nucleic acids MAY BE PRESENT.   A presumptive positive result was obtained on the submitted specimen  and confirmed on repeat testing.  While 2019 novel coronavirus  (SARS-CoV-2) nucleic acids may be present in the submitted sample  additional confirmatory testing may be necessary for epidemiological  and / or clinical management purposes  to differentiate between  SARS-CoV-2 and other Sarbecovirus currently known to infect humans.  If clinically indicated additional testing with an alternate test  methodology [phone]) is advised. The SARS-CoV-2 RNA is generally  detectable in upper and lower respiratory sp ecimens during the acute  phase of infection. The expected result is Negative. Fact Sheet for Patients:  StrictlyIdeas.no Fact Sheet for Healthcare Providers: BankingDealers.co.za This test is not yet approved or cleared by the [place] FDA and has been authorized for detection and/or diagnosis of SARS-CoV-2 by FDA under an Emergency Use Authorization (EUA).  This EUA will remain in effect (meaning this test can be used) for the duration of the COVID-19 declaration under Section 564(b)(1) of the Act, 21 U.S.C. section 360bbb-3(b)(1), unless the authorization is terminated or revoked sooner. Performed at [facility], [place] [address], [place], [place] [address]   C difficile quick scan w PCR reflex     Status: None   Collection Time: 07/18/19 12:16 AM   Specimen: Stool  Result Value Ref Range Status   C Diff antigen NEGATIVE NEGATIVE Final   C Diff toxin NEGATIVE NEGATIVE Final   C Diff interpretation No C. difficile detected.  Final    Comment: Performed at [facility], [place] [address], [place], [place]  [address]  Gastrointestinal Panel by PCR , Stool     Status: Abnormal   Collection Time: 07/18/19  1:45 AM   Specimen: Stool  Result Value Ref Range Status   Campylobacter species NOT DETECTED NOT DETECTED Final   Plesimonas shigelloides NOT DETECTED NOT DETECTED Final   Salmonella species NOT DETECTED NOT DETECTED Final   Yersinia enterocolitica NOT DETECTED NOT DETECTED Final   Vibrio species NOT DETECTED NOT DETECTED Final   Vibrio cholerae NOT DETECTED NOT DETECTED Final   Enteroaggregative E coli (EAEC) NOT DETECTED NOT DETECTED Final   Enteropathogenic E coli (EPEC) DETECTED (A) NOT DETECTED Final    Comment: RESULT CALLED TO, READ BACK BY AND VERIFIED WITH:  ANGELA EGLINGER AT 1330 07/18/2019 SDR    Enterotoxigenic E coli (ETEC) DETECTED (A) NOT DETECTED Final    Comment:  RESULT CALLED TO, READ BACK BY AND VERIFIED WITH:  Serita Grammes AT 1330 07/18/2019 SDR    Shiga like toxin producing E coli (STEC) NOT DETECTED NOT DETECTED Final   Shigella/Enteroinvasive E coli (EIEC) NOT DETECTED NOT DETECTED Final   Cryptosporidium NOT DETECTED NOT DETECTED Final   Cyclospora cayetanensis NOT DETECTED NOT DETECTED Final   Entamoeba histolytica NOT DETECTED NOT DETECTED Final   Giardia lamblia NOT DETECTED NOT DETECTED Final   Adenovirus F40/41 NOT DETECTED NOT DETECTED Final   Astrovirus NOT DETECTED NOT DETECTED Final   Norovirus GI/GII NOT DETECTED NOT DETECTED Final   Rotavirus A NOT DETECTED NOT DETECTED Final   Sapovirus (I, II, IV, and V) NOT DETECTED NOT DETECTED Final    Comment: Performed at [facility], [address], [place], [place] [address]     Studies: No results found.  Scheduled Meds: . acetaminophen  650 mg Oral Once  . apixaban  5 mg Oral BID  . atorvastatin  40 mg Oral Daily  . diltiazem  30 mg Oral BID  . DULoxetine  60 mg Oral Daily  . ferrous sulfate  325 mg Oral Q breakfast  . folic acid  6 mg Oral Daily  . gabapentin  600 mg Oral BID  .  insulin aspart  0-9 Units Subcutaneous TID WC  . methocarbamol  500 mg Oral BID  . mirtazapine  15 mg Oral QHS  . multivitamin with minerals  1 tablet Oral Daily  . nicotine  14 mg Transdermal Daily  . pantoprazole  40 mg Oral Daily  . thiamine  100 mg Oral Daily   Continuous Infusions: . sodium chloride Stopped (07/19/19 0028)  . levofloxacin (LEVAQUIN) IV Stopped (07/19/19 1825)    Active Problems:   Acute on chronic left systolic heart failure ([place])    Time spent: >35 minutes     [place] [facility] If 7PM-7AM, please contact night-coverage at [email or web address], password [facility] 07/20/2019, 4:22 PM  LOS: 3 days

## 2019-07-20 NOTE — Plan of Care (Signed)
  Problem: Education: Goal: Knowledge of General Education information will improve Description: Including pain rating scale, medication(s)/side effects and non-pharmacologic comfort measures 07/20/2019 0312 by Jacolyn Reedy, RN Outcome: Progressing 07/20/2019 0312 by Jacolyn Reedy, RN Outcome: Progressing   Problem: Health Behavior/Discharge Planning: Goal: Ability to manage health-related needs will improve 07/20/2019 0312 by Jacolyn Reedy, RN Outcome: Progressing 07/20/2019 0312 by Jacolyn Reedy, RN Outcome: Progressing   Problem: Clinical Measurements: Goal: Ability to maintain clinical measurements within normal limits will improve 07/20/2019 0312 by Jacolyn Reedy, RN Outcome: Progressing 07/20/2019 0312 by Jacolyn Reedy, RN Outcome: Progressing Goal: Will remain free from infection 07/20/2019 0312 by Jacolyn Reedy, RN Outcome: Progressing 07/20/2019 0312 by Jacolyn Reedy, RN Outcome: Progressing Goal: Diagnostic test results will improve 07/20/2019 0312 by Jacolyn Reedy, RN Outcome: Progressing 07/20/2019 0312 by Jacolyn Reedy, RN Outcome: Progressing Goal: Respiratory complications will improve 07/20/2019 0312 by Jacolyn Reedy, RN Outcome: Progressing 07/20/2019 0312 by Jacolyn Reedy, RN Outcome: Progressing Goal: Cardiovascular complication will be avoided 07/20/2019 7001 by Jacolyn Reedy, RN Outcome: Progressing 07/20/2019 0312 by Jacolyn Reedy, RN Outcome: Progressing   Problem: Activity: Goal: Risk for activity intolerance will decrease 07/20/2019 0312 by Jacolyn Reedy, RN Outcome: Progressing 07/20/2019 0312 by Jacolyn Reedy, RN Outcome: Progressing   Problem: Nutrition: Goal: Adequate nutrition will be maintained 07/20/2019 0312 by Jacolyn Reedy, RN Outcome: Progressing 07/20/2019 0312  by Jacolyn Reedy, RN Outcome: Progressing   Problem: Coping: Goal: Level of anxiety will decrease 07/20/2019 0312 by Jacolyn Reedy, RN Outcome: Progressing 07/20/2019 0312 by Jacolyn Reedy, RN Outcome: Progressing   Problem: Elimination: Goal: Will not experience complications related to bowel motility 07/20/2019 0312 by Jacolyn Reedy, RN Outcome: Progressing 07/20/2019 0312 by Jacolyn Reedy, RN Outcome: Progressing Goal: Will not experience complications related to urinary retention 07/20/2019 0312 by Jacolyn Reedy, RN Outcome: Progressing 07/20/2019 0312 by Jacolyn Reedy, RN Outcome: Progressing   Problem: Pain Managment: Goal: General experience of comfort will improve 07/20/2019 0312 by Jacolyn Reedy, RN Outcome: Progressing 07/20/2019 0312 by Jacolyn Reedy, RN Outcome: Progressing   Problem: Safety: Goal: Ability to remain free from injury will improve 07/20/2019 0312 by Jacolyn Reedy, RN Outcome: Progressing 07/20/2019 0312 by Jacolyn Reedy, RN Outcome: Progressing   Problem: Skin Integrity: Goal: Risk for impaired skin integrity will decrease 07/20/2019 0312 by Jacolyn Reedy, RN Outcome: Progressing 07/20/2019 0312 by Jacolyn Reedy, RN Outcome: Progressing   Problem: Education: Goal: Ability to demonstrate management of disease process will improve 07/20/2019 0312 by Jacolyn Reedy, RN Outcome: Progressing 07/20/2019 0312 by Jacolyn Reedy, RN Outcome: Progressing Goal: Ability to verbalize understanding of medication therapies will improve 07/20/2019 0312 by Jacolyn Reedy, RN Outcome: Progressing 07/20/2019 0312 by Jacolyn Reedy, RN Outcome: Progressing Goal: Individualized Educational Video(s) 07/20/2019 0312 by Jacolyn Reedy, RN Outcome: Progressing 07/20/2019 0312 by  Jacolyn Reedy, RN Outcome: Progressing   Problem: Activity: Goal: Capacity to carry out activities will improve 07/20/2019 0312 by Jacolyn Reedy, RN Outcome: Progressing 07/20/2019 0312 by Jacolyn Reedy, RN Outcome: Progressing   Problem: Cardiac: Goal: Ability to achieve and maintain adequate cardiopulmonary perfusion will improve 07/20/2019 0312 by Jacolyn Reedy, RN Outcome: Progressing 07/20/2019 0312 by Jacolyn Reedy, RN Outcome: Progressing

## 2019-07-20 NOTE — Telephone Encounter (Signed)
Patient called wanting to know if he could speak with you. Please follow up.

## 2019-07-21 DIAGNOSIS — N183 Chronic kidney disease, stage 3 (moderate): Secondary | ICD-10-CM

## 2019-07-21 DIAGNOSIS — Z794 Long term (current) use of insulin: Secondary | ICD-10-CM

## 2019-07-21 DIAGNOSIS — A044 Other intestinal Escherichia coli infections: Secondary | ICD-10-CM

## 2019-07-21 DIAGNOSIS — E1169 Type 2 diabetes mellitus with other specified complication: Secondary | ICD-10-CM

## 2019-07-21 LAB — MAGNESIUM: Magnesium: 1.7 mg/dL (ref 1.7–2.4)

## 2019-07-21 LAB — RENAL FUNCTION PANEL
Albumin: 2.5 g/dL — ABNORMAL LOW (ref 3.5–5.0)
Anion gap: 9 (ref 5–15)
BUN: 21 mg/dL — ABNORMAL HIGH (ref 6–20)
CO2: 26 mmol/L (ref 22–32)
Calcium: 8.2 mg/dL — ABNORMAL LOW (ref 8.9–10.3)
Chloride: 101 mmol/L (ref 98–111)
Creatinine, Ser: 1.44 mg/dL — ABNORMAL HIGH (ref 0.61–1.24)
GFR calc Af Amer: >60 mL/min (ref >60)
GFR calc non Af Amer: 53 mL/min — ABNORMAL LOW (ref >60)
Glucose, Bld: 133 mg/dL — ABNORMAL HIGH (ref 70–99)
Phosphorus: 3.7 mg/dL (ref 2.5–4.6)
Potassium: 3.7 mmol/L (ref 3.5–5.1)
Sodium: 136 mmol/L (ref 135–145)

## 2019-07-21 LAB — GLUCOSE, CAPILLARY
Glucose-Capillary: 132 mg/dL — ABNORMAL HIGH (ref 70–99)
Glucose-Capillary: 180 mg/dL — ABNORMAL HIGH (ref 70–99)
Glucose-Capillary: 133 mg/dL — ABNORMAL HIGH (ref 70–99)
Glucose-Capillary: 180 mg/dL — ABNORMAL HIGH (ref 70–99)

## 2019-07-21 MED ORDER — MAGNESIUM SULFATE 2 GM/50ML IV SOLN
2.0000 g | Freq: Once | INTRAVENOUS | Status: AC
  Administered 2019-07-21: 2 g via INTRAVENOUS
  Filled 2019-07-21: qty 50

## 2019-07-21 MED ORDER — INSULIN GLARGINE 100 UNIT/ML ~~LOC~~ SOLN
6.0000 [IU] | Freq: Every day | SUBCUTANEOUS | Status: DC
  Administered 2019-07-21: 6 [IU] via SUBCUTANEOUS
  Filled 2019-07-21 (×2): qty 0.06

## 2019-07-21 NOTE — Progress Notes (Signed)
PROGRESS NOTE    Brad [name]  GYK:[id number]  DOB: 04/07/1961  DOA: 07/17/2019 PCP: Charlott Rakes, MD  Brief Narrative:   58 year old African-American male with past medical history significant for documented nonischemic cardiomyopathy/chronic combined systolic and diastolic CHF with likely component of right-sided heart failure, with EF of 30 to 35%, moderately decreased right ventricular systolic function, ckd, hypertension, noncompliance, drug abuse, hyperlipidemia, hep C, GERD, alcohol abuse, diabetes mellitus, depression, chronic kidney disease stage III, liver cirrhosis, atrial flutter, chronic chest pain, presents with 3-day history of persistent significant bilateral leg edema.  -Patient reports 1 week history of watery diarrhea. There is associated vague abdominal pain. Patient had nausea initially, but now for now. No vomiting. Potassium of 2.8 is noted on presentation. Serum creatinine is at baseline. Albumin is 2.6. No significant drop in patient's hemoglobin. Patient be admitted for further assessment and management.   ED Course:On presentation to the hospital, temperature was 99.8, heart rate of 109 bpm, respiratory rate of 21, blood pressure 140/118 mmHg and O2 sat of 99%. Sodium is 129, potassium of 2.8, chloride of 94, BUN of 16, creatinine of 1.27. Blood sugar is 121. Fecal occult blood is negative.  Subjective:   Objective: Vitals:   07/20/19 1928 07/20/19 2241 07/21/19 0512 07/21/19 1137  BP: 120/85 (!) 125/97 115/83 (!) 141/100  Pulse: 83 88 85 91  Resp: 18  18   Temp: 97.8 F (36.6 C)  (!) 97.4 F (36.3 C) (!) 97.4 F (36.3 C)  TempSrc: Oral  Oral Oral  SpO2: 99%  97% 93%  Weight:   80.5 kg   Height:        Intake/Output Summary (Last 24 hours) at 07/21/2019 1918 Last data filed at 07/21/2019 1659 Gross per 24 hour  Intake 720 ml  Output 2850 ml  Net -2130 ml   Filed Weights   07/19/19 0558 07/20/19 0500 07/21/19 0512  Weight:  80.9 kg 78.7 kg 80.5 kg    Physical Examination:  General exam: Appears calm and comfortable  Respiratory system: Clear to auscultation. Respiratory effort normal. Cardiovascular system: S1 & S2 heard, RRR. No JVD, murmurs, rubs, gallops or clicks. No pedal edema. Gastrointestinal system: Abdomen is nondistended, soft and nontender. No organomegaly or masses felt. Normal bowel sounds heard. Central nervous system: Alert and oriented. No focal neurological deficits. Extremities: Symmetric 5 x 5 power. Skin: No rashes, lesions or ulcers Psychiatry: Judgement and insight appear normal. Mood & affect appropriate.     Data Reviewed: I have personally reviewed following labs and imaging studies  CBC: Recent Labs  Lab 07/16/19 1513 07/17/19 1823 07/18/19 0242  WBC 2.9* 4.4 3.8*  NEUTROABS  --  3.1  --   HGB 12.6* 13.8 13.1  HCT 36.2* 40.3 38.3*  MCV 90.5 92.0 91.8  PLT 180 192 793   Basic Metabolic Panel: Recent Labs  Lab 07/17/19 1823 07/18/19 0242 07/18/19 1240 07/19/19 0640 07/20/19 0537 07/21/19 0632  NA 129* 133* 137 135 134* 136  K 2.8* 3.2* 3.7 3.8 3.2* 3.7  CL 94* 96* 102 101 100 101  CO2 22 26 27 26 26 26   GLUCOSE 121* 134* 153* 133* 133* 133*  BUN 16 19 19 18 19  21*  CREATININE 1.27* 1.37* 1.36* 1.50* 1.58* 1.44*  CALCIUM 8.0* 8.1* 8.1* 8.2* 8.1* 8.2*  MG 1.4* 1.3*  --  1.5*  --  1.7  PHOS  --  2.9  --   --   --  3.7   GFR: Estimated  Creatinine Clearance: 58.6 mL/min (A) (by C-G formula based on SCr of 1.44 mg/dL (H)). Liver Function Tests: Recent Labs  Lab 07/17/19 1823 07/21/19 0632  AST 35  --   ALT 29  --   ALKPHOS 58  --   BILITOT 1.0  --   PROT 6.4*  --   ALBUMIN 2.6* 2.5*   Recent Labs  Lab 07/17/19 1823  LIPASE 38   No results for input(s): AMMONIA in the last 168 hours. Coagulation Profile: Recent Labs  Lab 07/16/19 1513  INR 1.1   Cardiac Enzymes: No results for input(s): CKTOTAL, CKMB, CKMBINDEX, TROPONINI in the last 168  hours. BNP (last 3 results) No results for input(s): PROBNP in the last 8760 hours. HbA1C: No results for input(s): HGBA1C in the last 72 hours. CBG: Recent Labs  Lab 07/20/19 1653 07/20/19 2124 07/21/19 0612 07/21/19 1249 07/21/19 1607  GLUCAP 159* 104* 132* 180* 133*   Lipid Profile: No results for input(s): CHOL, HDL, LDLCALC, TRIG, CHOLHDL, LDLDIRECT in the last 72 hours. Thyroid Function Tests: No results for input(s): TSH, T4TOTAL, FREET4, T3FREE, THYROIDAB in the last 72 hours. Anemia Panel: No results for input(s): VITAMINB12, FOLATE, FERRITIN, TIBC, IRON, RETICCTPCT in the last 72 hours. Sepsis Labs: No results for input(s): PROCALCITON, LATICACIDVEN in the last 168 hours.  Recent Results (from the past 240 hour(s))  SARS Coronavirus 2 (CEPHEID - Performed in [place] hospital lab), Hosp Order     Status: None   Collection Time: 07/17/19  8:26 PM   Specimen: Nasopharyngeal Swab  Result Value Ref Range Status   SARS Coronavirus 2 NEGATIVE NEGATIVE Final    Comment: (NOTE) If result is NEGATIVE SARS-CoV-2 target nucleic acids are NOT DETECTED. The SARS-CoV-2 RNA is generally detectable in upper and lower  respiratory specimens during the acute phase of infection. The lowest  concentration of SARS-CoV-2 viral copies this assay can detect is 250  copies / mL. A negative result does not preclude SARS-CoV-2 infection  and should not be used as the sole basis for treatment or other  patient management decisions.  A negative result may occur with  improper specimen collection / handling, submission of specimen other  than nasopharyngeal swab, presence of viral mutation(s) within the  areas targeted by this assay, and inadequate number of viral copies  (<250 copies / mL). A negative result must be combined with clinical  observations, patient history, and epidemiological information. If result is POSITIVE SARS-CoV-2 target nucleic acids are DETECTED. The SARS-CoV-2  RNA is generally detectable in upper and lower  respiratory specimens dur ing the acute phase of infection.  Positive  results are indicative of active infection with SARS-CoV-2.  Clinical  correlation with patient history and other diagnostic information is  necessary to determine patient infection status.  Positive results do  not rule out bacterial infection or co-infection with other viruses. If result is PRESUMPTIVE POSTIVE SARS-CoV-2 nucleic acids MAY BE PRESENT.   A presumptive positive result was obtained on the submitted specimen  and confirmed on repeat testing.  While 2019 novel coronavirus  (SARS-CoV-2) nucleic acids may be present in the submitted sample  additional confirmatory testing may be necessary for epidemiological  and / or clinical management purposes  to differentiate between  SARS-CoV-2 and other Sarbecovirus currently known to infect humans.  If clinically indicated additional testing with an alternate test  methodology [phone]) is advised. The SARS-CoV-2 RNA is generally  detectable in upper and lower respiratory sp ecimens during the  acute  phase of infection. The expected result is Negative. Fact Sheet for Patients:  StrictlyIdeas.no Fact Sheet for Healthcare Providers: BankingDealers.co.za This test is not yet approved or cleared by the [place] FDA and has been authorized for detection and/or diagnosis of SARS-CoV-2 by FDA under an Emergency Use Authorization (EUA).  This EUA will remain in effect (meaning this test can be used) for the duration of the COVID-19 declaration under Section 564(b)(1) of the Act, 21 U.S.C. section 360bbb-3(b)(1), unless the authorization is terminated or revoked sooner. Performed at [facility], [place] [address], [place], [place] [address]   C difficile quick scan w PCR reflex     Status: None   Collection Time: 07/18/19 12:16 AM   Specimen: Stool  Result Value Ref  Range Status   C Diff antigen NEGATIVE NEGATIVE Final   C Diff toxin NEGATIVE NEGATIVE Final   C Diff interpretation No C. difficile detected.  Final    Comment: Performed at [facility], [place] [address], [place], [place] [address]  Gastrointestinal Panel by PCR , Stool     Status: Abnormal   Collection Time: 07/18/19  1:45 AM   Specimen: Stool  Result Value Ref Range Status   Campylobacter species NOT DETECTED NOT DETECTED Final   Plesimonas shigelloides NOT DETECTED NOT DETECTED Final   Salmonella species NOT DETECTED NOT DETECTED Final   Yersinia enterocolitica NOT DETECTED NOT DETECTED Final   Vibrio species NOT DETECTED NOT DETECTED Final   Vibrio cholerae NOT DETECTED NOT DETECTED Final   Enteroaggregative E coli (EAEC) NOT DETECTED NOT DETECTED Final   Enteropathogenic E coli (EPEC) DETECTED (A) NOT DETECTED Final    Comment: RESULT CALLED TO, READ BACK BY AND VERIFIED WITH:  ANGELA EGLINGER AT 1330 07/18/2019 SDR    Enterotoxigenic E coli (ETEC) DETECTED (A) NOT DETECTED Final    Comment: RESULT CALLED TO, READ BACK BY AND VERIFIED WITH:  ANGELA EGLINGER AT 1330 07/18/2019 SDR    Shiga like toxin producing E coli (STEC) NOT DETECTED NOT DETECTED Final   Shigella/Enteroinvasive E coli (EIEC) NOT DETECTED NOT DETECTED Final   Cryptosporidium NOT DETECTED NOT DETECTED Final   Cyclospora cayetanensis NOT DETECTED NOT DETECTED Final   Entamoeba histolytica NOT DETECTED NOT DETECTED Final   Giardia lamblia NOT DETECTED NOT DETECTED Final   Adenovirus F40/41 NOT DETECTED NOT DETECTED Final   Astrovirus NOT DETECTED NOT DETECTED Final   Norovirus GI/GII NOT DETECTED NOT DETECTED Final   Rotavirus A NOT DETECTED NOT DETECTED Final   Sapovirus (I, II, IV, and V) NOT DETECTED NOT DETECTED Final    Comment: Performed at [facility], [address], [place], [place] [address]      Radiology Studies: No results found.      Scheduled Meds: . acetaminophen  650  mg Oral Once  . apixaban  5 mg Oral BID  . atorvastatin  40 mg Oral Daily  . diltiazem  30 mg Oral BID  . DULoxetine  60 mg Oral Daily  . ferrous sulfate  325 mg Oral Q breakfast  . folic acid  6 mg Oral Daily  . gabapentin  600 mg Oral BID  . insulin aspart  0-9 Units Subcutaneous TID WC  . methocarbamol  500 mg Oral BID  . mirtazapine  15 mg Oral QHS  . multivitamin with minerals  1 tablet Oral Daily  . nicotine  14 mg Transdermal Daily  . pantoprazole  40 mg Oral  Daily  . thiamine  100 mg Oral Daily  . torsemide  60 mg Oral BID   Continuous Infusions: . sodium chloride 250 mL (07/20/19 1744)    Assessment & Plan:   Acute on chronic combined systolic and diastolic CHF with likely component of right-sided heart failure and chronic liver cirrhosis related edema. Low albumin. Echo (05/2019). EF 30-35%  -leg swellings significantly improved IV Lasix 80 mg (TID) on admission, decreased to 60 BID due to diarrhea related volume loss and rising creatinine. Now transitioned to oral home regimen  Hypokalemia, hypomagnesemia. Likely secondary to combined diuretic use and diarrhea. Monitor closely and replete as needed.  Severe E.coli Diarrhea: due to food poisoning likely. Patient has had diarrhea for 1 week. Patient reports eating at [facility] with friend who is also sick apparently. C diff -negative. Gastrointestinal PCR+e coli.-improved with levofloxacin/metronidazol. Off antibiotics now.   Advised to hold Magnesium supplements for a week. D/C Miralax. Asking for pain meds on d/c , will give bentyl.   CKD II. baseline creatinine is around 1.3-1.5. Peaked to 1.58 during the hospital course and now down to 1.48. Received 250 ml fluids yesterday.Ok to d/c on home dose of Demadex.    Hypertension: on cardizem, hydralzine. monitor   Diabetes mellitus: ha1c-7.3. monitor on ISS. Takes Lantus 30 units at home but feels doesn't need that much. Start 5 units here as now asking for ice cream and  eating better.   COPD: no acute wheezing. cont bronchodilators   History of illicit drug use: Recent urine drug screen from June positive for coacine. Repeat test  not sent.   History  of A flutter. On cardizem, apixaban. Stable  Hypomagnesemia: Will order 1 dose IV today as oral supplements might exacerbate diarrhea. May resume oral supplements in 1 week.   DVT prophylaxis: on eliquis Code Status: full  Family / Patient Communication: d/w patient  Disposition Plan: D/C to friends home in am as arranged by SW     LOS: 4 days     Time spent:     Guilford Shi, MD [facility] Pager [phone]  If 7PM-7AM, please contact night-coverage [email or web address] Password [email or web address] 07/21/2019, 7:18 PM

## 2019-07-21 NOTE — TOC Progression Note (Signed)
Transition of Care [facility]) - Progression Note    Patient Details  Name: Brad [name] MRN: [id number] Date of Birth: 09/17/1961  Transition of Care [facility]) CM/SW [place], [place] Phone Number: 07/21/2019, 4:04 PM  Clinical Narrative:    CSW received a call from [place] in [facility].  Pt has been in contact and has a plan to discharge to a friends home from his church. He is hopeful for this plan but shares he must be there by 11 to receive shelter.   CSW spoke with pt MD, MD not sure if pt will be stable for discharge at that point. Should pt be stable- will provide assistance with discharge transportation. Continue to follow.    Expected Discharge Plan: Homeless Shelter Barriers to Discharge: Continued Medical Work up  Expected Discharge Plan and Services Expected Discharge Plan: Homeless Shelter In-house Referral: Clinical Social Work Discharge Planning Services: CM Consult, [facility]   Living arrangements for the past 2 months: [place], No permanent address HH Arranged: NA  Social Determinants of Health (SDOH) Interventions    Readmission Risk Interventions Readmission Risk Prevention Plan 07/20/2019 05/28/2019  Transportation Screening Complete Complete  Medication Review Press photographer) Complete Complete  PCP or Specialist appointment within 3-5 days of discharge Complete Complete  HRI or [place] Not Complete Complete  HRI or Home Care Consult Pt Refusal Comments pt is homeless -  SW Recovery Care/Counseling Consult Complete Complete  Palliative Care Screening Not Applicable Not [place] Not Applicable Not Applicable  Some recent data might be hidden

## 2019-07-21 NOTE — Plan of Care (Signed)

## 2019-07-21 NOTE — Telephone Encounter (Signed)
Return call placed to patient. Patient shared that he is experiencing psychosocial stressors after being evicted from place of residence. He is unable to reside with his adult children and was informed in hospital that there is no availability in local shelters. Pt reports being sober from substances for 2.5 months. He is not interested in inpatient treatment at [facility]. States hx of attending NA meetings routinely.   LCSW inquired about pt's behavioral health. Pt denies having suicidal or homicidal ideations stating, "I'm not like that" He identified his children as protective factors noting adult daughter calls daily to check in and provide encouragement. LCSW provided an opportunity for pt to self-reflect on how he has overcame multiple hurdles throughout his life, promoting hope and positive feelings. Healthy coping skills were discussed.  Pt was offered housing through a church member's nephew; however, he would need to be discharged from hospital by 11:00 AM 07/22/2019 LCSW strongly encouraged patient to discuss discharge plan with medical team.   LCSW discussed patient's plan with [place].  No additional concerns noted

## 2019-07-22 ENCOUNTER — Other Ambulatory Visit: Payer: Self-pay | Admitting: Family Medicine

## 2019-07-22 DIAGNOSIS — K7460 Unspecified cirrhosis of liver: Secondary | ICD-10-CM

## 2019-07-22 DIAGNOSIS — F1410 Cocaine abuse, uncomplicated: Secondary | ICD-10-CM

## 2019-07-22 DIAGNOSIS — K529 Noninfective gastroenteritis and colitis, unspecified: Secondary | ICD-10-CM

## 2019-07-22 DIAGNOSIS — Z590 Homelessness: Secondary | ICD-10-CM

## 2019-07-22 DIAGNOSIS — E118 Type 2 diabetes mellitus with unspecified complications: Secondary | ICD-10-CM

## 2019-07-22 DIAGNOSIS — E876 Hypokalemia: Secondary | ICD-10-CM

## 2019-07-22 LAB — GLUCOSE, CAPILLARY: Glucose-Capillary: 118 mg/dL — ABNORMAL HIGH (ref 70–99)

## 2019-07-22 MED ORDER — LANTUS SOLOSTAR 100 UNIT/ML ~~LOC~~ SOPN: 8.0000 [IU] | PEN_INJECTOR | Freq: Every day | SUBCUTANEOUS | 6 refills | Status: DC

## 2019-07-22 MED ORDER — DICYCLOMINE HCL 10 MG PO CAPS: 10.0000 mg | ORAL_CAPSULE | Freq: Three times a day (TID) | ORAL | 0 refills | Status: DC | PRN

## 2019-07-22 MED ORDER — ALBUTEROL SULFATE HFA 108 (90 BASE) MCG/ACT IN AERS: 2.0000 | INHALATION_SPRAY | Freq: Four times a day (QID) | RESPIRATORY_TRACT | 1 refills | Status: DC | PRN

## 2019-07-22 NOTE — Discharge Summary (Signed)
Physician Discharge Summary  Brad [name] BEM:[id number] DOB: [birth year] DOA: 07/17/2019  PCP: Charlott Rakes, MD  Admit date: 07/17/2019 Discharge date: 07/22/2019 Consultations: None Admitted From: home Disposition: home   Discharge Diagnoses:  Principal Problem:   Acute on chronic left systolic heart failure (HCC) Active Problems:   Gastroenteritis   GERD (gastroesophageal reflux disease)   Type 2 diabetes mellitus with complication, without long-term current use of insulin (HCC)   Hypokalemia   CKD (chronic kidney disease) stage 3, GFR 30-59 ml/min (HCC)   Cocaine abuse (HCC)   Homelessness   Cirrhosis (HCC)   Shortness of breath   Trauma   Permanent atrial fibrillation   Hypertensive heart disease   Severe mitral regurgitation   Tobacco abuse   Hypomagnesemia    Hospital Course Summary:  58 year old African-American male with past medical history significant for documented nonischemic cardiomyopathy/chronic combined systolic and diastolic CHF with likely component of right-sided heart failure, with EF of 30 to 35%, moderately decreased right ventricular systolic function,ckd,hypertension, noncompliance, drug abuse, hyperlipidemia, hep C, GERD, alcohol abuse, diabetes mellitus, depression, chronic kidney disease stage III, liver cirrhosis, atrial flutter, chronic chest pain, presents with 3-day history of persistent significant bilateral leg edema.  -Patient reported 1 week history of watery diarrhea with associated vague abdominal pain. Patient had nausea initially, but now for now. No vomiting. Potassium of 2.8 is noted on presentation. Serum creatinine is at baseline. Albumin is 2.6. No significant drop in patient's hemoglobin. Patient admitted to Hospitalist service for further assessment and management  ED Course:On presentation to the hospital, temperature was 99.8, heart rate of 109 bpm, respiratory rate of 21, blood pressure 140/118 mmHg and O2 sat of 99%.  Sodium is 129, potassium of 2.8, chloride of 94, BUN of 16, creatinine of 1.27. Blood sugar is 121. Fecal occult blood negative.  Acute on chronic combined systolic and diastolic CHF with likely component of right-sided heart failure and chronic liver cirrhosis related edema. Low albumin. Echo (05/2019). EF 30-35%  -leg swellings significantly improvedIV Lasix 80 mg(TID) on admission, decreased to 60BID due to diarrhea related volume loss and rising creatinine. Now transitioned to oral home regimen  Hypokalemia. Likely secondary to combined diuretic use and diarrhea. Monitored closely and repletedas needed during the hospital course.Can resume potassium with diuretics at home.  SevereE.coli Diarrhea:due to food poisoning likely. Patient has had diarrhea for 1 week. Patient reports eating at [facility] with friend who is also sick apparently. C diff -negative. Gastrointestinal PCR+e coli.-improved with levofloxacin/metronidazol. Off antibiotics now.  Advised to hold Magnesium supplements for a week. D/C Miralax. Asking for pain meds on d/c , will give bentyl.   CKD II.baseline creatinine is around 1.3-1.5. Peaked to 1.58 during the hospital course and now down to 1.48. Received 250 ml fluids yesterday.Ok to d/c on home dose of Demadex.    Hypomagnesemia: Patient has chronic hypomagnesemia and takes supplements at home. Held now due to diarrhea. He received 2g IV Mag on 7/30 for level of 1.7 and advised to resume oral supplements in 5-7 days to avoid exacerbation of resolving diarrhea.    Hypertension: on cardizem, hydralzine. monitor   Diabetes mellitus:ha1c-7.3. monitor on ISS. Takes Lantus 30 units at home but feels doesn't need that much. Started 6 units here and BG stable overnight. He is not very compliant with diabetic diet ( was constantly asking for ice cream) . Advised to start at 8 units and titrate up per BG checks (if >200 persistently) as his oral intake slowly  picks up.    COPD:no acute wheezing. cont bronchodilators   History of illicit drug use: Recent urine drug screen from June positive for coacine. Repeat test  not sent.   History of A flutter.On cardizem, apixaban. Stable  Homelessness: Patient has made arrangements with family and will live with a room mate paying 250$/month which he says he can afford. Appreciate SW assistance.     Discharge Exam:  Vitals:   07/21/19 2007 07/22/19 0535  BP: 129/86 (!) 110/91  Pulse: 83 92  Resp: 18 18  Temp: 98 F (36.7 C) (!) 97.5 F (36.4 C)  SpO2: 96% 100%   Vitals:   07/21/19 2007 07/22/19 0158 07/22/19 0204 07/22/19 0535  BP: 129/86   (!) 110/91  Pulse: 83   92  Resp: 18   18  Temp: 98 F (36.7 C)   (!) 97.5 F (36.4 C)  TempSrc:    Oral  SpO2: 96%   100%  Weight:  79.4 kg 79.4 kg   Height:        General: Pt is alert, awake, not in acute distress Cardiovascular: RRR, S1/S2 +, no rubs, no gallops Respiratory: CTA bilaterally, no wheezing, no rhonchi Abdominal: Soft, NT, ND, bowel sounds + Extremities: no edema, no cyanosis  Discharge Condition:Stable CODE STATUS: Diet recommendation: Recommendations for Outpatient Follow-up:  1. Follow up with PCP: 1 week 2. Follow up with consultants: N/A 3. Please obtain follow up labs including: BMP, mag level  Home Health services upon discharge:  Equipment/Devices upon discharge:   Discharge Instructions:  Discharge Instructions    Call MD for:  persistant dizziness or light-headedness   Complete by: As directed    Call MD for:  persistant nausea and vomiting   Complete by: As directed    Call MD for:  severe uncontrolled pain   Complete by: As directed    Call MD for:  temperature >100.4   Complete by: As directed    Diet Carb Modified   Complete by: As directed    Increase activity slowly   Complete by: As directed      Allergies as of 07/22/2019      Reactions   Angiotensin Receptor Blockers Anaphylaxis, Other (See  Comments)   (Angioedema also with Lisinopril, therefore ARB's are contraindicated)   Lisinopril Anaphylaxis, Other (See Comments)   Throat swells   Pamelor [nortriptyline Hcl] Anaphylaxis, Swelling   Throat swells      Medication List    STOP taking these medications   hydrALAZINE 25 MG tablet Commonly known as: APRESOLINE   nicotine 14 mg/24hr patch Commonly known as: NICODERM CQ - dosed in mg/24 hours   polyethylene glycol 17 g packet Commonly known as: MIRALAX / GLYCOLAX     TAKE these medications   Accu-Chek FastClix Lancets Misc Use as directed three times daily   Accu-Chek Guide w/Device Kit 1 each by Does not apply route 3 (three) times daily.   albuterol 108 (90 Base) MCG/ACT inhaler Commonly known as: VENTOLIN HFA Inhale 2 puffs into the lungs every 6 (six) hours as needed for wheezing or shortness of breath.   apixaban 5 MG Tabs tablet Commonly known as: Eliquis Take 1 tablet (5 mg total) by mouth 2 (two) times daily.   atorvastatin 40 MG tablet Commonly known as: LIPITOR Take 1 tablet (40 mg total) by mouth daily.   dicyclomine 10 MG capsule Commonly known as: Bentyl Take 1 capsule (10 mg total) by mouth 3 (three) times daily  as needed for up to 14 days for spasms (abdominal pain/cramps).   diltiazem 30 MG tablet Commonly known as: CARDIZEM Take 1 tablet (30 mg total) by mouth 2 (two) times daily.   DULoxetine 60 MG capsule Commonly known as: Cymbalta Take 1 capsule (60 mg total) by mouth daily.   Easy Comfort Pen Needles 31G X 8 MM Misc Generic drug: Insulin Pen Needle USE AS DIRECTED TWICE A DAY   ferrous sulfate 325 (65 FE) MG tablet Take 1 tablet (325 mg total) by mouth daily with breakfast.   folic acid 1 MG tablet Commonly known as: FOLVITE Take 6 tablets (6 mg total) by mouth daily.   gabapentin 300 MG capsule Commonly known as: NEURONTIN Take 2 capsules (600 mg total) by mouth 2 (two) times daily.   glucose blood test  strip Commonly known as: Accu-Chek Guide Use as directed three times daily   Lantus SoloStar 100 UNIT/ML Solostar Pen Generic drug: Insulin Glargine Inject 8-14 Units into the skin daily. Start with 8 units and titrate per home BG checks (if >200 persistently) as discussed at discharge What changed:   how much to take  additional instructions   Magnesium Oxide 400 MG Caps Take 1 capsule (400 mg total) by mouth 2 (two) times a day.   methocarbamol 500 MG tablet Commonly known as: ROBAXIN Take 1 tablet (500 mg total) by mouth 2 (two) times daily.   mirtazapine 15 MG tablet Commonly known as: Remeron Take 1 tablet (15 mg total) by mouth at bedtime.   multivitamin with minerals Tabs tablet Take 1 tablet by mouth daily.   pantoprazole 40 MG tablet Commonly known as: PROTONIX Take 1 tablet (40 mg total) by mouth daily.   potassium chloride SA 20 MEQ tablet Commonly known as: K-DUR Take 1 tablet (20 mEq total) by mouth daily.   thiamine 100 MG tablet Take 1 tablet (100 mg total) by mouth daily.   torsemide 20 MG tablet Commonly known as: DEMADEX Take 3 tablets (60 mg total) by mouth 2 (two) times daily.       Allergies  Allergen Reactions  . Angiotensin Receptor Blockers Anaphylaxis and Other (See Comments)    (Angioedema also with Lisinopril, therefore ARB's are contraindicated)  . Lisinopril Anaphylaxis and Other (See Comments)    Throat swells  . Pamelor [Nortriptyline Hcl] Anaphylaxis and Swelling    Throat swells      The results of significant diagnostics from this hospitalization (including imaging, microbiology, ancillary and laboratory) are listed below for reference.    Labs: BNP (last 3 results) Recent Labs    07/04/19 1345 07/11/19 1225 07/17/19 1823  BNP 1,694.1* 836.4* 3,428.7*   Basic Metabolic Panel: Recent Labs  Lab 07/17/19 1823 07/18/19 0242 07/18/19 1240 07/19/19 0640 07/20/19 0537 07/21/19 0632  NA 129* 133* 137 135 134* 136   K 2.8* 3.2* 3.7 3.8 3.2* 3.7  CL 94* 96* 102 101 100 101  CO2 _0 GLUCOSE 121* 134* 153* 133* 133* 133*  BUN _1 21*  CREATININE 1.27* 1.37* 1.36* 1.50* 1.58* 1.44*  CALCIUM 8.0* 8.1* 8.1* 8.2* 8.1* 8.2*  MG 1.4* 1.3*  --  1.5*  --  1.7  PHOS  --  2.9  --   --   --  3.7   Liver Function Tests: Recent Labs  Lab 07/17/19 1823 07/21/19 0632  AST 35  --   ALT 29  --   ALKPHOS 58  --  BILITOT 1.0  --   PROT 6.4*  --   ALBUMIN 2.6* 2.5*   Recent Labs  Lab 07/17/19 1823  LIPASE 38   No results for input(s): AMMONIA in the last 168 hours. CBC: Recent Labs  Lab 07/16/19 1513 07/17/19 1823 07/18/19 0242  WBC 2.9* 4.4 3.8*  NEUTROABS  --  3.1  --   HGB 12.6* 13.8 13.1  HCT 36.2* 40.3 38.3*  MCV 90.5 92.0 91.8  PLT 180 192 187   Cardiac Enzymes: No results for input(s): CKTOTAL, CKMB, CKMBINDEX, TROPONINI in the last 168 hours. BNP: Invalid input(s): POCBNP CBG: Recent Labs  Lab 07/21/19 0612 07/21/19 1249 07/21/19 1607 07/21/19 2100 07/22/19 0623  GLUCAP 132* 180* 133* 180* 118*   D-Dimer No results for input(s): DDIMER in the last 72 hours. Hgb A1c No results for input(s): HGBA1C in the last 72 hours. Lipid Profile No results for input(s): CHOL, HDL, LDLCALC, TRIG, CHOLHDL, LDLDIRECT in the last 72 hours. Thyroid function studies No results for input(s): TSH, T4TOTAL, T3FREE, THYROIDAB in the last 72 hours.  Invalid input(s): FREET3 Anemia work up No results for input(s): VITAMINB12, FOLATE, FERRITIN, TIBC, IRON, RETICCTPCT in the last 72 hours. Urinalysis    Component Value Date/Time   COLORURINE STRAW (A) 07/18/2019 0145   APPEARANCEUR CLEAR 07/18/2019 0145   LABSPEC 1.005 07/18/2019 0145   PHURINE 7.0 07/18/2019 0145   GLUCOSEU NEGATIVE 07/18/2019 0145   HGBUR SMALL (A) 07/18/2019 0145   BILIRUBINUR NEGATIVE 07/18/2019 0145   BILIRUBINUR neg 05/08/2017 1503   KETONESUR NEGATIVE 07/18/2019 0145   PROTEINUR 100 (A)  07/18/2019 0145   UROBILINOGEN 0.2 05/08/2017 1503   UROBILINOGEN 1.0 04/26/2012 1328   NITRITE NEGATIVE 07/18/2019 0145   LEUKOCYTESUR NEGATIVE 07/18/2019 0145   Sepsis Labs Invalid input(s): PROCALCITONIN,  WBC,  LACTICIDVEN Microbiology Recent Results (from the past 240 hour(s))  SARS Coronavirus 2 (CEPHEID - Performed in [place] hospital lab), Hosp Order     Status: None   Collection Time: 07/17/19  8:26 PM   Specimen: Nasopharyngeal Swab  Result Value Ref Range Status   SARS Coronavirus 2 NEGATIVE NEGATIVE Final    Comment: (NOTE) If result is NEGATIVE SARS-CoV-2 target nucleic acids are NOT DETECTED. The SARS-CoV-2 RNA is generally detectable in upper and lower  respiratory specimens during the acute phase of infection. The lowest  concentration of SARS-CoV-2 viral copies this assay can detect is 250  copies / mL. A negative result does not preclude SARS-CoV-2 infection  and should not be used as the sole basis for treatment or other  patient management decisions.  A negative result may occur with  improper specimen collection / handling, submission of specimen other  than nasopharyngeal swab, presence of viral mutation(s) within the  areas targeted by this assay, and inadequate number of viral copies  (<250 copies / mL). A negative result must be combined with clinical  observations, patient history, and epidemiological information. If result is POSITIVE SARS-CoV-2 target nucleic acids are DETECTED. The SARS-CoV-2 RNA is generally detectable in upper and lower  respiratory specimens dur ing the acute phase of infection.  Positive  results are indicative of active infection with SARS-CoV-2.  Clinical  correlation with patient history and other diagnostic information is  necessary to determine patient infection status.  Positive results do  not rule out bacterial infection or co-infection with other viruses. If result is PRESUMPTIVE POSTIVE SARS-CoV-2 nucleic acids MAY  BE PRESENT.   A presumptive positive result was obtained  on the submitted specimen  and confirmed on repeat testing.  While 2019 novel coronavirus  (SARS-CoV-2) nucleic acids may be present in the submitted sample  additional confirmatory testing may be necessary for epidemiological  and / or clinical management purposes  to differentiate between  SARS-CoV-2 and other Sarbecovirus currently known to infect humans.  If clinically indicated additional testing with an alternate test  methodology [phone]) is advised. The SARS-CoV-2 RNA is generally  detectable in upper and lower respiratory sp ecimens during the acute  phase of infection. The expected result is Negative. Fact Sheet for Patients:  StrictlyIdeas.no Fact Sheet for Healthcare Providers: BankingDealers.co.za This test is not yet approved or cleared by the [place] FDA and has been authorized for detection and/or diagnosis of SARS-CoV-2 by FDA under an Emergency Use Authorization (EUA).  This EUA will remain in effect (meaning this test can be used) for the duration of the COVID-19 declaration under Section 564(b)(1) of the Act, 21 U.S.C. section 360bbb-3(b)(1), unless the authorization is terminated or revoked sooner. Performed at [facility], [place] [address] [place], [place] [address]   C difficile quick scan w PCR reflex     Status: None   Collection Time: 07/18/19 12:16 AM   Specimen: Stool  Result Value Ref Range Status   C Diff antigen NEGATIVE NEGATIVE Final   C Diff toxin NEGATIVE NEGATIVE Final   C Diff interpretation No C. difficile detected.  Final    Comment: Performed at [facility], [place] [address], [place], [place] [address]  Gastrointestinal Panel by PCR , Stool     Status: Abnormal   Collection Time: 07/18/19  1:45 AM   Specimen: Stool  Result Value Ref Range Status   Campylobacter species NOT DETECTED NOT DETECTED Final   Plesimonas  shigelloides NOT DETECTED NOT DETECTED Final   Salmonella species NOT DETECTED NOT DETECTED Final   Yersinia enterocolitica NOT DETECTED NOT DETECTED Final   Vibrio species NOT DETECTED NOT DETECTED Final   Vibrio cholerae NOT DETECTED NOT DETECTED Final   Enteroaggregative E coli (EAEC) NOT DETECTED NOT DETECTED Final   Enteropathogenic E coli (EPEC) DETECTED (A) NOT DETECTED Final    Comment: RESULT CALLED TO, READ BACK BY AND VERIFIED WITH:  ANGELA EGLINGER AT 1330 07/18/2019 SDR    Enterotoxigenic E coli (ETEC) DETECTED (A) NOT DETECTED Final    Comment: RESULT CALLED TO, READ BACK BY AND VERIFIED WITH:  ANGELA EGLINGER AT 1330 07/18/2019 SDR    Shiga like toxin producing E coli (STEC) NOT DETECTED NOT DETECTED Final   Shigella/Enteroinvasive E coli (EIEC) NOT DETECTED NOT DETECTED Final   Cryptosporidium NOT DETECTED NOT DETECTED Final   Cyclospora cayetanensis NOT DETECTED NOT DETECTED Final   Entamoeba histolytica NOT DETECTED NOT DETECTED Final   Giardia lamblia NOT DETECTED NOT DETECTED Final   Adenovirus F40/41 NOT DETECTED NOT DETECTED Final   Astrovirus NOT DETECTED NOT DETECTED Final   Norovirus GI/GII NOT DETECTED NOT DETECTED Final   Rotavirus A NOT DETECTED NOT DETECTED Final   Sapovirus (I, II, IV, and V) NOT DETECTED NOT DETECTED Final    Comment: Performed at [facility], [address] [place], [place] [address]    Procedures/Studies: Dg Chest 2 View  Result Date: 07/17/2019 CLINICAL DATA:  Shortness of breath. Lower extremity edema. Congestive heart failure. Cirrhosis. EXAM: CHEST - 2 VIEW COMPARISON:  07/16/2019 FINDINGS: Stable moderate cardiomegaly. Stable tiny left pleural effusion versus pleural thickening. No evidence of  pulmonary infiltrate or edema. No significant change compared to prior study. IMPRESSION: Stable cardiomegaly and tiny left pleural effusion versus pleural thickening. No acute findings. Electronically Signed   By: Marlaine Hind M.D.    On: 07/17/2019 17:52   Dg Chest 2 View  Result Date: 07/16/2019 CLINICAL DATA:  Pt complains of central chest pain and BLE swelling x several days; reports hx of CHF,DM, HTN, and atrial flutter; smoker EXAM: CHEST - 2 VIEW COMPARISON:  20/20 20 FINDINGS: The heart is enlarged. No focal consolidations. Small LEFT pleural effusion. No pulmonary edema. IMPRESSION: 1. Cardiomegaly. 2. Small LEFT pleural effusion. Electronically Signed   By: Nolon Nations M.D.   On: 07/16/2019 15:54   Dg Chest 2 View  Result Date: 07/11/2019 CLINICAL DATA:  Chest pain EXAM: CHEST - 2 VIEW COMPARISON:  June 19, 2019 FINDINGS: Stable cardiomegaly. No pneumothorax. Scarring or atelectasis in the left perihilar region and left base. No other interval changes. IMPRESSION: No active cardiopulmonary disease. Electronically Signed   By: Dorise Bullion III M.D   On: 07/11/2019 12:39    Time coordinating discharge: Over 30 minutes  SIGNED:   Guilford Shi, MD  [facility] 07/22/2019, 7:57 AM Pager : [phone]

## 2019-07-22 NOTE — Progress Notes (Signed)
Pt has orders to be discharged. Discharge instructions given and pt has no additional questions at this time. Medication regimen reviewed and pt educated. Pt verbalized understanding and has no additional questions. Telemetry box removed. IV removed and site in good condition. Pt stable and waiting for transportation. 

## 2019-07-22 NOTE — TOC Transition Note (Signed)
Transition of Care [facility]) - CM/SW Discharge Note   Patient Details  Name: Brad [name] MRN: [id number] Date of Birth: [birth year]  [facility] CM/SW Contact:  Alexander Mt, [place] Phone Number: 07/22/2019, 8:34 AM   Clinical Narrative:    Pt has secured housing at a friends home, per CSW discussion he needs to discharge and arrive prior to 11am. Teacher, music has a cab voucher for pt should he need assistance upon discharge with transportation.   Final next level of care: Home/Self Care Barriers to Discharge: Continued Medical Work up   Patient Goals and CMS Choice Patient states their goals for this hospitalization and ongoing recovery are:: to have a place to go CMS Medicare.gov Compare Post Acute Care list provided to:: (n/a) Choice offered to / list presented to : NA  Discharge Placement  Discharge Plan and Services In-house Referral: Clinical Social Work Discharge Planning Services: CM Consult, [place] Arranged: NA          Social Determinants of Health (SDOH) Interventions     Readmission Risk Interventions Readmission Risk Prevention Plan 07/20/2019 05/28/2019  Transportation Screening Complete Complete  Medication Review [employer] photographer) Complete Complete  PCP or Specialist appointment within 3-5 days of discharge Complete Complete  HRI or [place] Not Complete Complete  HRI or Home Care Consult Pt Refusal Comments pt is homeless -  SW Recovery Care/Counseling Consult Complete Complete  Palliative Care Screening Not Applicable Not [place] Not Applicable Not Applicable  Some recent data might be hidden

## 2019-07-23 IMAGING — DX DG CHEST 2V
2 series · 2 of 2 positions shown · non-contrast
Comparison: PA and lateral chest 03/16/2018. CT chest and PA and
lateral chest 12/16/2017.

CLINICAL DATA: Shortness of breath for 3 days.

EXAM:
CHEST - 2 VIEW

[x chest ap]
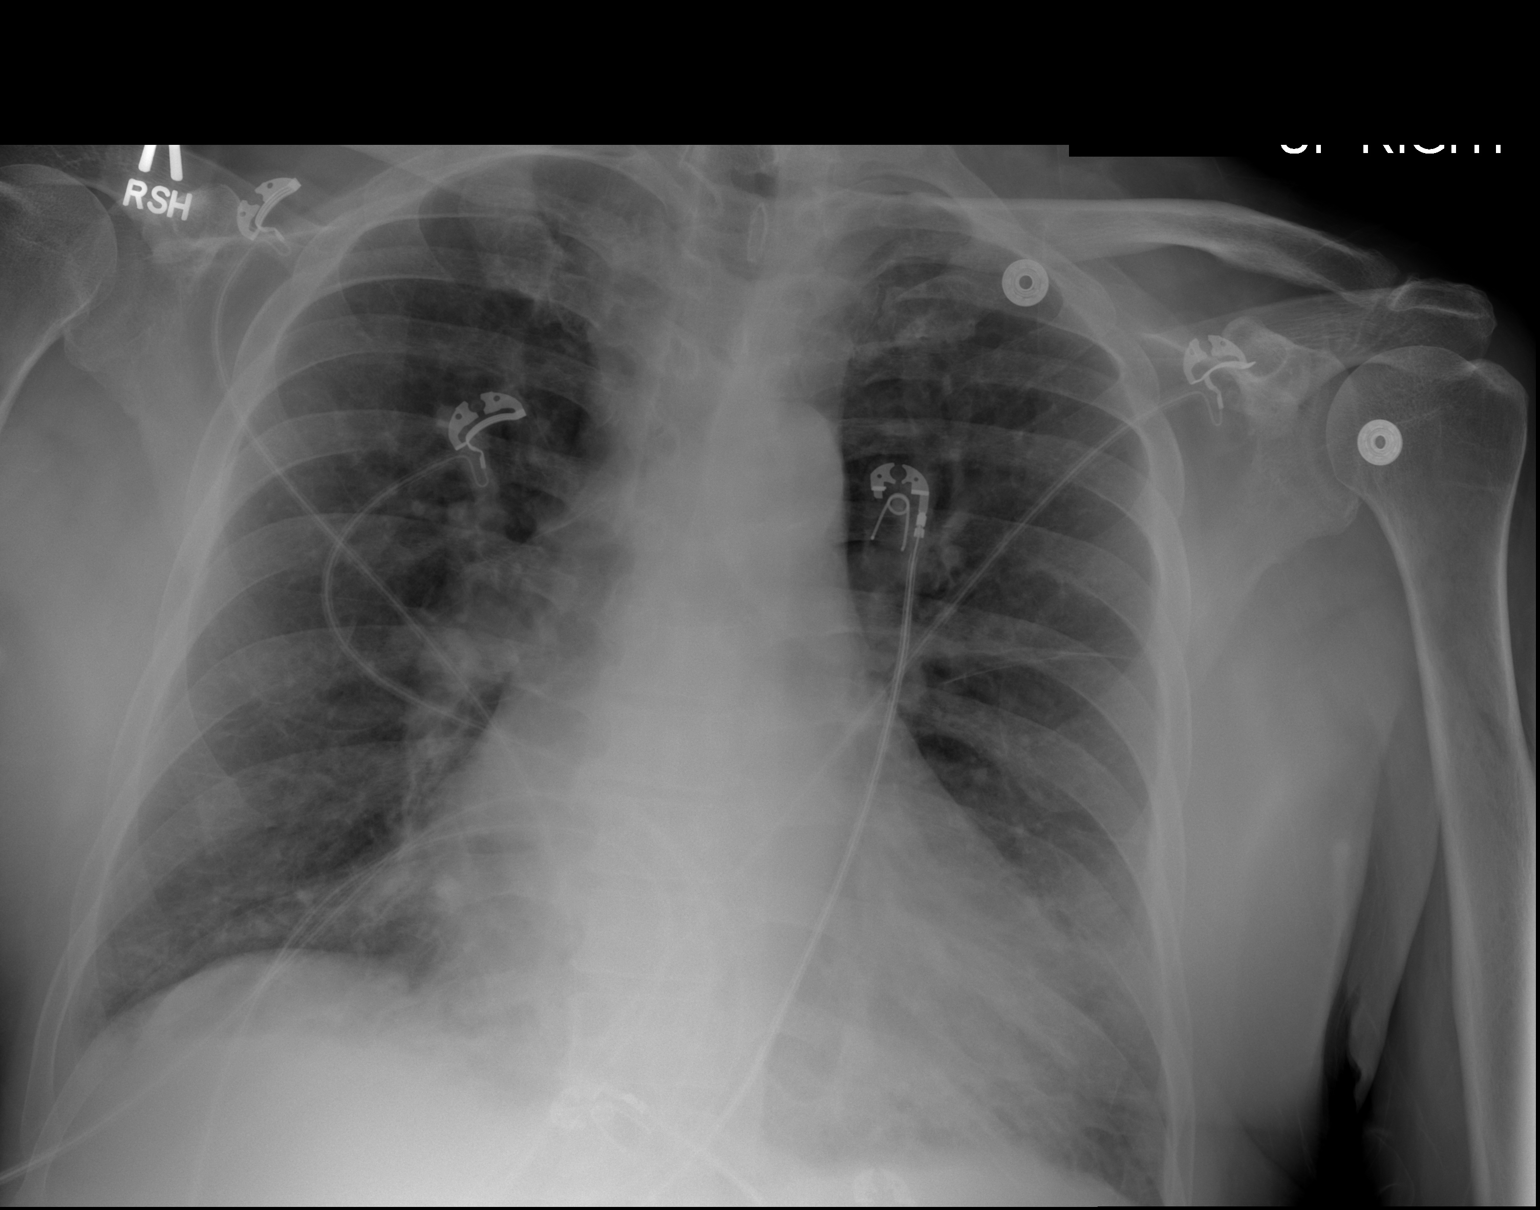

[w chest lat]
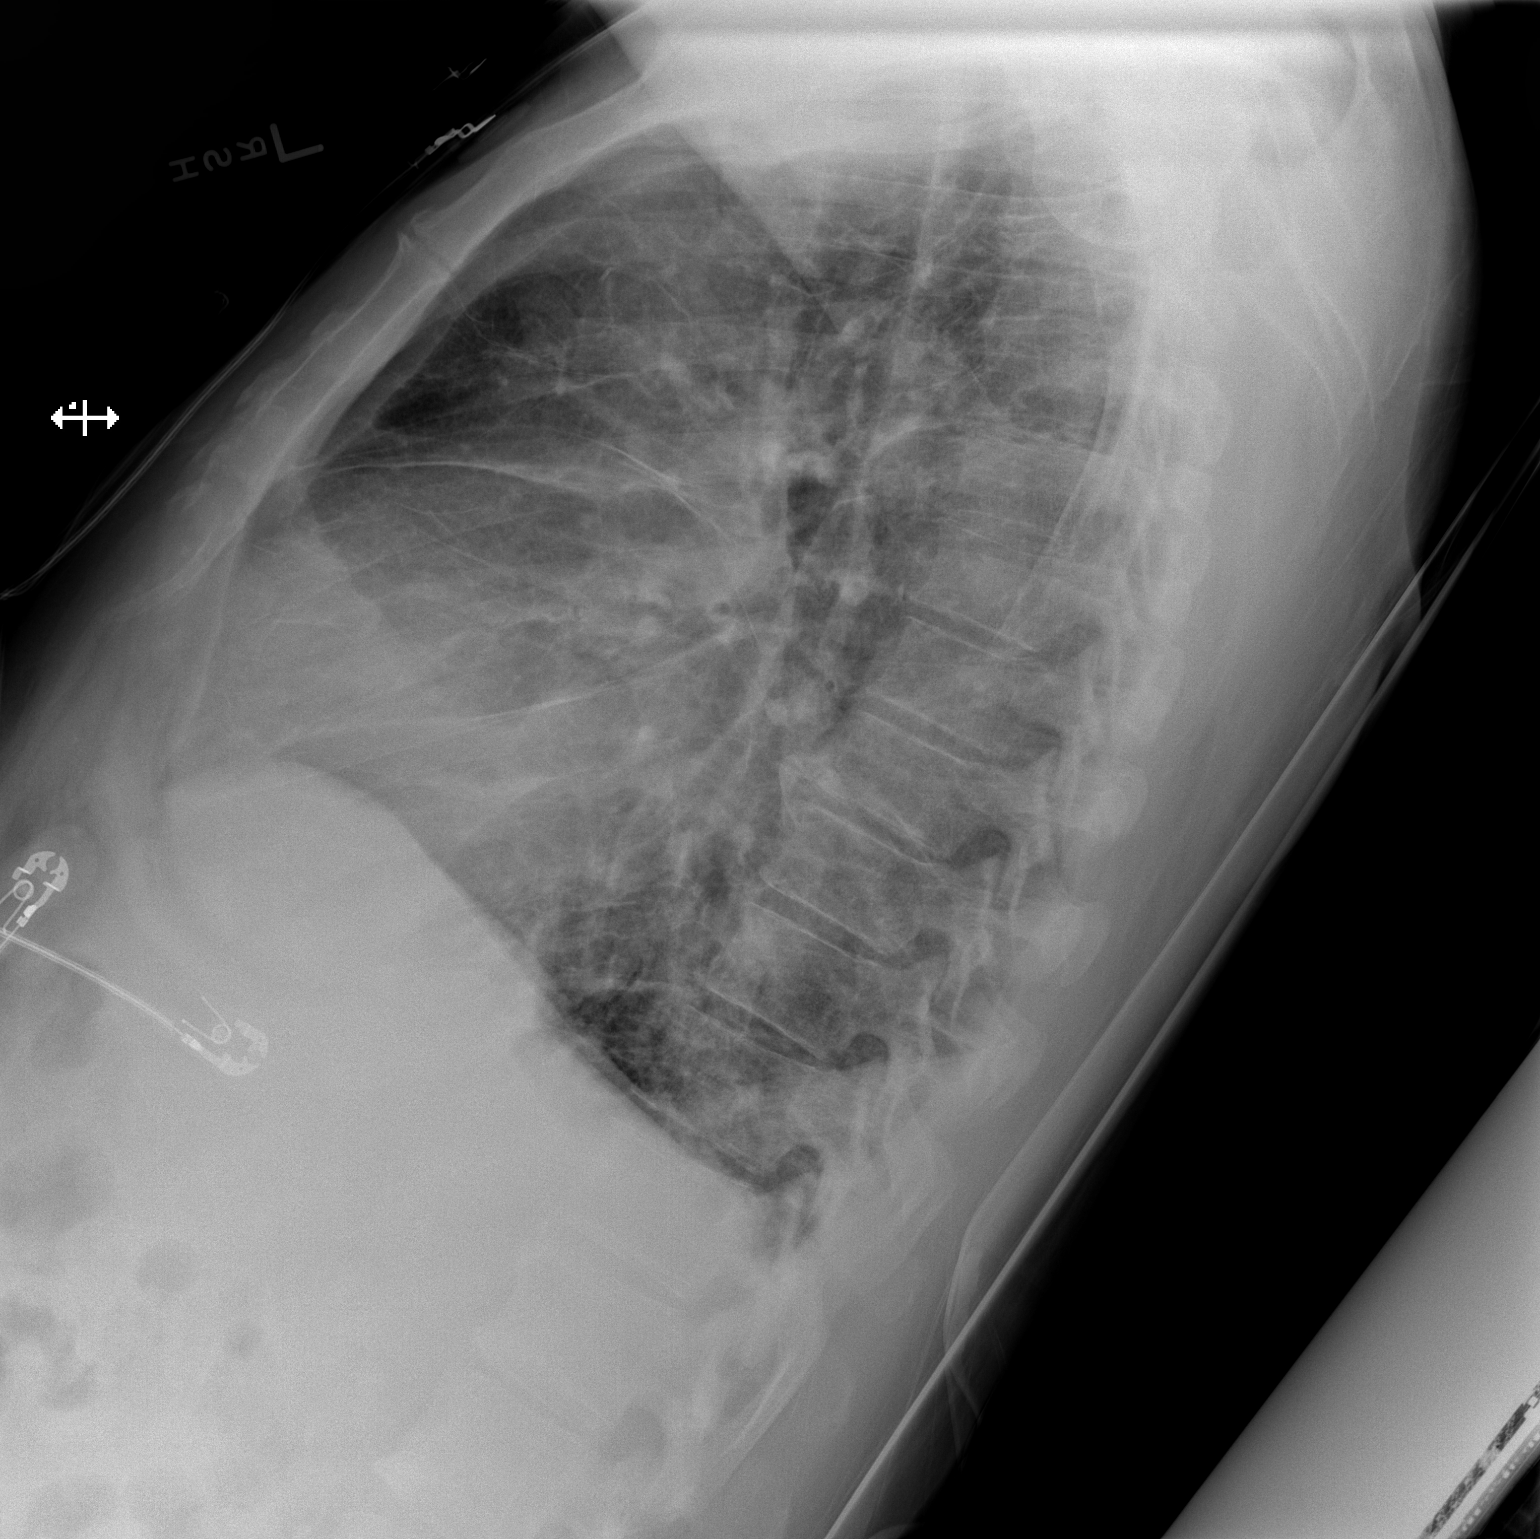

[2 of 2 positions shown; findings below may reference images not displayed]

FINDINGS: There is cardiomegaly without pulmonary edema. No consolidative
process, pneumothorax or effusion. Aortic atherosclerosis is noted.
No focal bony abnormality.
IMPRESSION: No acute disease.

Cardiomegaly.

Atherosclerosis.

## 2019-07-25 ENCOUNTER — Other Ambulatory Visit (HOSPITAL_COMMUNITY): Payer: Self-pay

## 2019-07-25 ENCOUNTER — Telehealth: Payer: Self-pay

## 2019-07-25 ENCOUNTER — Telehealth (HOSPITAL_COMMUNITY): Payer: Self-pay

## 2019-07-25 NOTE — Telephone Encounter (Signed)
Transition Care Management Follow-up Telephone Call  Call completed when Brad [name], EMT was with the patient.    Date of discharge and from where: 07/22/2019. [facility]   How have you been since you were released from the hospital? As her Brad [name], he is " feeling good."   Any questions or concerns? Patient's only concern was obtaining a copy of the physician's discharge summary noting the he had severe Ecoli diarrhea. He said that he wanted to share this information with [employer] where he had eaten prior to his hospitalization.  Instructed him that he will need to obtain this from the hospital medical records department.   Items Reviewed:  Did the pt receive and understand the discharge instructions provided? yes  Medications obtained and verified? As per Brad [name], he has all medications but his not taking all of them as ordered. He is not taking his insulin. No questions about medications reported.    Any new allergies since your discharge? None reported   Do you have support at home? He is currently staying with his friend, [name], at the [employer]. His said that his sister is sending him the key to his mother's house that has been rented out and the renters are leaving so he plans to move into that house.    Other (ie: DME, Home Health, etc) he receives community paramedicine visit weekly from [employer], EMT. She assists with medication management.   Has glucometer.  Blood sugar this morning: 200.   Functional Questionnaire: (I = Independent and D = Dependent) ADL's:independent. Not using a cane at this time.  Medication box is filled by Brad [name], EMT   Follow up appointments reviewed:    PCP Hospital f/u appt confirmed? 08/01/2019 @ 1050.  [name] to update this information in patient's [facility] f/u appt confirmed? Cardiology - 08/02/2019, GI - 08/03/2019.   Are transportation arrangements needed? no  If their condition worsens, is  the pt aware to call  their PCP or go to the ED?yes  Was the patient provided with contact information for the PCP's office or ED? He has the phone number for the clinic  Was the pt encouraged to call back with questions or concerns? He is aware to call with questions.

## 2019-07-25 NOTE — Telephone Encounter (Signed)
Dr Margarita Rana - from discharge call.  Patient has appt 08/01/2019.   As per [name], EMT  he has all medications but his not taking all of them as ordered. He is not taking his insulin.

## 2019-07-25 NOTE — Telephone Encounter (Signed)
Left voicemail for Brad [name] to return my call to schedule home visit.

## 2019-07-25 NOTE — Progress Notes (Signed)
Paramedicine Encounter    Patient ID: Brad [name], male    DOB: 1961/01/17, 58 y.o.   MRN: [id number]   Patient Care Team: Charlott Rakes, MD as PCP - General (Family Medicine) Debara Pickett Nadean Corwin, MD as PCP - Cardiology (Cardiology) Charlott Rakes, MD (Family Medicine)  Patient Active Problem List   Diagnosis Date Noted  . Gastroenteritis 07/22/2019  . Acute on chronic left systolic heart failure ([place]) 07/17/2019  . Hypomagnesemia 06/19/2019  . Congestive heart failure (CHF) ([place]) 06/12/2019  . Chest pain 06/07/2019  . Acute hepatic encephalopathy 06/07/2019  . Acute exacerbation of CHF (congestive heart failure) ([place]) 06/06/2019  . Elevated troponin 05/23/2019  . Tobacco abuse 05/23/2019  . CHF exacerbation ([place]) 05/23/2019  . Acute on chronic systolic (congestive) heart failure ([place]) 02/22/2019  . Acute on chronic systolic congestive heart failure ([place]) 02/21/2019  . Alcohol abuse 02/21/2019  . Frequent falls 01/17/2019  . Severe mitral regurgitation   . Fall 11/01/2018  . Hyponatremia 10/31/2018  . Abdominal distention   . Acute systolic congestive heart failure ([place])   . Chronic atrial fibrillation   . Chronic hyponatremia 08/16/2018  . NSVT (nonsustained ventricular tachycardia) ([place])   . Concussion with loss of consciousness   . Scalp laceration   . Trauma   . Permanent atrial fibrillation   . Hypertensive heart disease   . Shortness of breath   . ACS (acute coronary syndrome) ([place]) 07/25/2018  . Pyogenic inflammation of bone ([place])   . Ankle swelling, right 04/30/2018  . Cirrhosis ([place]) 03/23/2018  . Right ankle pain 03/23/2018  . Acute respiratory failure with hypoxia ([place]) 11/06/2017  . Syncope 08/07/2017  . Acute on chronic combined systolic and diastolic heart failure ([place]) 07/09/2017  . Atrial flutter ([place]) 07/09/2017  . Pre-syncope 07/08/2017  . Neuropathy 05/08/2017  . Substance induced mood disorder ([place]) 10/06/2016  . CVA (cerebral vascular  accident) ([place]) 09/18/2016  . Left sided numbness   . Homelessness 08/21/2016  . S/P ORIF (open reduction internal fixation) fracture 08/01/2016  . CAD (coronary artery disease), native coronary artery 07/30/2016  . Surgery, elective   . Insomnia 07/22/2016  . Acute diastolic heart failure ([place])   . NSTEMI (non-ST elevated myocardial infarction) ([place])   . Anemia 07/05/2016  . Thrombocytopenia ([place]) 07/05/2016  . AKI (acute kidney injury) ([place])   . Cocaine abuse ([place]) 07/02/2016  . Chest pain on breathing 07/01/2016  . Essential hypertension 07/01/2016  . Type 2 diabetes mellitus with complication, without long-term current use of insulin ([place]) 07/01/2016  . Hypokalemia 07/01/2016  . CKD (chronic kidney disease) stage 3, GFR 30-59 ml/min (HCC) 07/01/2016  . Painful diabetic neuropathy ([place]) 07/01/2016  . Polysubstance abuse ([place]) 05/27/2016  . Chronic hepatitis C with cirrhosis ([place]) 05/27/2016  . Chronic diastolic congestive heart failure ([place]) 01/31/2016  . Depression 04/21/2012  . GERD (gastroesophageal reflux disease) 02/16/2012  . History of drug abuse ([place])   . Heroin addiction ([place]) 01/29/2012    Current Outpatient Medications:  .  albuterol (VENTOLIN HFA) 108 (90 Base) MCG/ACT inhaler, Inhale 2 puffs into the lungs every 6 (six) hours as needed for wheezing or shortness of breath., Disp: 8 g, Rfl: 1 .  apixaban (ELIQUIS) 5 MG TABS tablet, Take 1 tablet (5 mg total) by mouth 2 (two) times daily., Disp: 60 tablet, Rfl: 6 .  Blood Glucose Monitoring Suppl (ACCU-CHEK GUIDE) w/Device KIT, 1 each by Does not apply route 3 (three) times daily., Disp: 1 kit, Rfl: 0 .  dicyclomine (BENTYL) 10 MG capsule, Take 1 capsule (10 mg total) by mouth 3 (three) times daily as needed for up to 14 days for spasms (abdominal pain/cramps)., Disp: 56 capsule, Rfl: 0 .  EASY COMFORT PEN NEEDLES 31G X 8 MM MISC, USE AS DIRECTED TWICE A DAY, Disp: 100 each, Rfl: 5 .  gabapentin (NEURONTIN) 300 MG  capsule, Take 2 capsules (600 mg total) by mouth 2 (two) times daily., Disp: 60 capsule, Rfl: 3 .  glucose blood (ACCU-CHEK GUIDE) test strip, Use as directed three times daily, Disp: 100 each, Rfl: 12 .  torsemide (DEMADEX) 20 MG tablet, Take 3 tablets (60 mg total) by mouth 2 (two) times daily., Disp: 180 tablet, Rfl: 1 .  ACCU-CHEK FASTCLIX LANCETS MISC, Use as directed three times daily (Patient not taking: Reported on 07/25/2019), Disp: 102 each, Rfl: 12 .  atorvastatin (LIPITOR) 40 MG tablet, Take 1 tablet (40 mg total) by mouth daily. (Patient not taking: Reported on 07/25/2019), Disp: 90 tablet, Rfl: 1 .  diltiazem (CARDIZEM) 30 MG tablet, Take 1 tablet (30 mg total) by mouth 2 (two) times daily. (Patient not taking: Reported on 07/25/2019), Disp: 60 tablet, Rfl: 3 .  DULoxetine (CYMBALTA) 60 MG capsule, Take 1 capsule (60 mg total) by mouth daily. (Patient not taking: Reported on 07/25/2019), Disp: 30 capsule, Rfl: 3 .  ferrous sulfate 325 (65 FE) MG tablet, Take 1 tablet (325 mg total) by mouth daily with breakfast. (Patient not taking: Reported on 07/25/2019), Disp: 30 tablet, Rfl: 0 .  folic acid (FOLVITE) 1 MG tablet, Take 6 tablets (6 mg total) by mouth daily. (Patient not taking: Reported on 07/25/2019), Disp: 30 tablet, Rfl: 6 .  Insulin Glargine (LANTUS SOLOSTAR) 100 UNIT/ML Solostar Pen, Inject 8-14 Units into the skin daily. Start with 8 units and titrate per home BG checks (if >200 persistently) as discussed at discharge (Patient not taking: Reported on 07/25/2019), Disp: 3 pen, Rfl: 6 .  Magnesium Oxide 400 MG CAPS, Take 1 capsule (400 mg total) by mouth 2 (two) times a day. (Patient not taking: Reported on 07/25/2019), Disp: 60 capsule, Rfl: 1 .  methocarbamol (ROBAXIN) 500 MG tablet, Take 1 tablet (500 mg total) by mouth 2 (two) times daily. (Patient not taking: Reported on 07/25/2019), Disp: 20 tablet, Rfl: 0 .  mirtazapine (REMERON) 15 MG tablet, Take 1 tablet (15 mg total) by mouth at bedtime.  (Patient not taking: Reported on 07/25/2019), Disp: 30 tablet, Rfl: 3 .  Multiple Vitamin (MULTIVITAMIN WITH MINERALS) TABS tablet, Take 1 tablet by mouth daily. (Patient not taking: Reported on 07/25/2019), Disp: 30 tablet, Rfl: 0 .  pantoprazole (PROTONIX) 40 MG tablet, Take 1 tablet (40 mg total) by mouth daily. (Patient not taking: Reported on 07/25/2019), Disp: 30 tablet, Rfl: 3 .  potassium chloride SA (K-DUR) 20 MEQ tablet, Take 1 tablet (20 mEq total) by mouth daily. (Patient not taking: Reported on 07/25/2019), Disp: 30 tablet, Rfl: 3 .  thiamine 100 MG tablet, Take 1 tablet (100 mg total) by mouth daily. (Patient not taking: Reported on 07/25/2019), Disp: 30 tablet, Rfl: 0 Allergies  Allergen Reactions  . Angiotensin Receptor Blockers Anaphylaxis and Other (See Comments)    (Angioedema also with Lisinopril, therefore ARB's are contraindicated)  . Lisinopril Anaphylaxis and Other (See Comments)    Throat swells  . Pamelor [Nortriptyline Hcl] Anaphylaxis and Swelling    Throat swells     Social History   Socioeconomic History  . Marital status: Married  Spouse name: Not on file  . Number of children: 3  . Years of education: 2y college  . Highest education level: Not on file  Occupational History  . 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 Financial resource strain: Not on file  . Food insecurity    Worry: Not on file    Inability: Not on file  . Transportation needs    Medical: Not on file    Non-medical: Not on file  Tobacco Use  . Smoking status: Current Every Day Smoker    Packs/day: 0.50    Years: 28.00    Pack years: 14.00    Types: Cigarettes  . Smokeless tobacco: Never Used  Substance and Sexual Activity  . Alcohol use: Yes    Alcohol/week: 7.0 standard drinks    Types: 7 Cans of beer per week    Comment: "I drink ~ 40oz beer/day"  . Drug use: Yes    Types: IV, Cocaine, Heroin    Comment: 08/17/2018 "no heroin since 2013;  I use cocaine ~ once/month, last use 03/21/2019  . Sexual activity: Not Currently  Lifestyle  . Physical activity    Days per week: Not on file    Minutes per session: Not on file  . Stress: Not on file  Relationships  . Social Herbalist on phone: Not on file    Gets together: Not on file    Attends religious service: Not on file    Active member of club or organization: Not on file    Attends meetings of clubs or organizations: Not on file    Relationship status: Not on file  . Intimate partner violence    Fear of current or ex partner: Not on file    Emotionally abused: Not on file    Physically abused: Not on file    Forced sexual activity: Not on file  Other Topics Concern  . Not on file  Social History Narrative   ** Merged History Encounter **       Incarcerated from 2006-2010, then 10/2011-12/2011.  Has been trying to get sober (no heroin, alcohol since 10/2011).     Physical Exam Vitals signs reviewed.  HENT:     Head: Normocephalic.     Nose: Nose normal.     Mouth/Throat:     Mouth: Mucous membranes are moist.  Eyes:     Pupils: Pupils are equal, round, and reactive to light.  Neck:     Musculoskeletal: Normal range of motion.  Cardiovascular:     Rate and Rhythm: Normal rate and regular rhythm.     Pulses: Normal pulses.     Heart sounds: Normal heart sounds.  Pulmonary:     Effort: Pulmonary effort is normal.     Breath sounds: Normal breath sounds.  Abdominal:     General: There is distension.  Musculoskeletal: Normal range of motion.     Right lower leg: No edema.     Left lower leg: No edema.  Skin:    General: Skin is warm and dry.     Capillary Refill: Capillary refill takes less than 2 seconds.  Neurological:     General: No focal deficit present.     Mental Status: He is alert.  Psychiatric:        Mood and Affect: Mood normal.      Arrived for home visit for Brad [name]. Mr. Eastridge  is currently staying with his friend  [name] at [employer] in [place]. [place] states he is supposed to be receiving a key from his sister for his mothers home. Brad [name] says he has been feeling good and has not been taking all of his medications. I informed the patient it is in his best interest to be taking the medication he is prescribed. Patient stated, "I am not going to take all of those medicines."  Patient explained he has been taking his Torsemide, Gabapentin, Bentyl and Albuterol. Patient had minimal edema noted in his legs. Lung sounds were clear. Patient's vitals were assessed. Patient stated he should be moving into his mothers old home soon. He states it will be him and [name] living together inside the home only having to pay utilities. [place] agreed for me to come see him where ever he is. Patient agreed to all upcoming visits. Home visit complete.   CBG- 207 WT- 178lbs   NO MEDS ORDERED    Future Appointments  Date Time Provider [place]  08/01/2019 10:50 AM Charlott Rakes, MD [facility] None  08/02/2019 10:30 AM [facility] PA/NP [facility] None  08/03/2019  4:00 PM Willia Craze, NP [facility]  09/06/2019  3:10 PM Charlott Rakes, MD [facility] None     ACTION: Home visit completed

## 2019-07-27 ENCOUNTER — Telehealth: Payer: Self-pay

## 2019-07-27 NOTE — Telephone Encounter (Signed)
Call received from patient stating that he and his friend, [name], have been staying at [employer] and are now out of money.  He explained that his sister is sending him a key to his mother's house.  As per [employer], it has been delivered to his address which is the [facility]  He said that he has been to the [facility] every day this week and has not received the package with the key. He noted that he will go back to the [facility] at 0800 tomorrow to wait to check his mail again. You are only allowed to check mail between 1100-1200 daily.   Call placed to [facility], spoke to E and explained the patient's concern about his package.  E stated that he knows the patient well and the patient has been there every day and there is no package. He suggested that the patient check the tracking information.   Call placed to patient and explained call with E.  Patient stated that he has the tracking information and will call [employer].

## 2019-07-28 ENCOUNTER — Emergency Department (HOSPITAL_COMMUNITY): Payer: Medicaid Other

## 2019-07-28 ENCOUNTER — Encounter (HOSPITAL_COMMUNITY): Payer: Self-pay | Admitting: Emergency Medicine

## 2019-07-28 ENCOUNTER — Other Ambulatory Visit: Payer: Self-pay

## 2019-07-28 ENCOUNTER — Observation Stay (HOSPITAL_COMMUNITY)
Admission: EM | Admit: 2019-07-28 | Discharge: 2019-07-31 | Disposition: A | Discharge status: Home / Self Care | Payer: Medicaid Other | Attending: Family Medicine | Admitting: Family Medicine

## 2019-07-28 DIAGNOSIS — Z794 Long term (current) use of insulin: Secondary | ICD-10-CM | POA: Insufficient documentation

## 2019-07-28 DIAGNOSIS — Z8546 Personal history of malignant neoplasm of prostate: Secondary | ICD-10-CM | POA: Insufficient documentation

## 2019-07-28 DIAGNOSIS — I10 Essential (primary) hypertension: Secondary | ICD-10-CM | POA: Diagnosis present

## 2019-07-28 DIAGNOSIS — E1142 Type 2 diabetes mellitus with diabetic polyneuropathy: Secondary | ICD-10-CM | POA: Insufficient documentation

## 2019-07-28 DIAGNOSIS — I5042 Chronic combined systolic (congestive) and diastolic (congestive) heart failure: Secondary | ICD-10-CM | POA: Insufficient documentation

## 2019-07-28 DIAGNOSIS — E876 Hypokalemia: Secondary | ICD-10-CM | POA: Insufficient documentation

## 2019-07-28 DIAGNOSIS — I2510 Atherosclerotic heart disease of native coronary artery without angina pectoris: Secondary | ICD-10-CM | POA: Diagnosis not present

## 2019-07-28 DIAGNOSIS — Z20828 Contact with and (suspected) exposure to other viral communicable diseases: Secondary | ICD-10-CM | POA: Diagnosis not present

## 2019-07-28 DIAGNOSIS — K219 Gastro-esophageal reflux disease without esophagitis: Secondary | ICD-10-CM | POA: Insufficient documentation

## 2019-07-28 DIAGNOSIS — R740 Nonspecific elevation of levels of transaminase and lactic acid dehydrogenase [LDH]: Secondary | ICD-10-CM | POA: Diagnosis present

## 2019-07-28 DIAGNOSIS — Z96652 Presence of left artificial knee joint: Secondary | ICD-10-CM | POA: Insufficient documentation

## 2019-07-28 DIAGNOSIS — E871 Hypo-osmolality and hyponatremia: Secondary | ICD-10-CM | POA: Insufficient documentation

## 2019-07-28 DIAGNOSIS — M1990 Unspecified osteoarthritis, unspecified site: Secondary | ICD-10-CM | POA: Insufficient documentation

## 2019-07-28 DIAGNOSIS — F1410 Cocaine abuse, uncomplicated: Secondary | ICD-10-CM | POA: Diagnosis not present

## 2019-07-28 DIAGNOSIS — B182 Chronic viral hepatitis C: Secondary | ICD-10-CM | POA: Insufficient documentation

## 2019-07-28 DIAGNOSIS — R7401 Elevation of levels of liver transaminase levels: Secondary | ICD-10-CM

## 2019-07-28 DIAGNOSIS — Z79899 Other long term (current) drug therapy: Secondary | ICD-10-CM | POA: Insufficient documentation

## 2019-07-28 DIAGNOSIS — E1122 Type 2 diabetes mellitus with diabetic chronic kidney disease: Secondary | ICD-10-CM | POA: Insufficient documentation

## 2019-07-28 DIAGNOSIS — I428 Other cardiomyopathies: Secondary | ICD-10-CM | POA: Insufficient documentation

## 2019-07-28 DIAGNOSIS — I4892 Unspecified atrial flutter: Secondary | ICD-10-CM | POA: Insufficient documentation

## 2019-07-28 DIAGNOSIS — F1910 Other psychoactive substance abuse, uncomplicated: Secondary | ICD-10-CM | POA: Diagnosis present

## 2019-07-28 DIAGNOSIS — G4700 Insomnia, unspecified: Secondary | ICD-10-CM | POA: Diagnosis not present

## 2019-07-28 DIAGNOSIS — N183 Chronic kidney disease, stage 3 (moderate): Secondary | ICD-10-CM | POA: Diagnosis not present

## 2019-07-28 DIAGNOSIS — Z9119 Patient's noncompliance with other medical treatment and regimen: Secondary | ICD-10-CM | POA: Diagnosis not present

## 2019-07-28 DIAGNOSIS — K7460 Unspecified cirrhosis of liver: Secondary | ICD-10-CM | POA: Insufficient documentation

## 2019-07-28 DIAGNOSIS — F1010 Alcohol abuse, uncomplicated: Secondary | ICD-10-CM | POA: Diagnosis not present

## 2019-07-28 DIAGNOSIS — E7800 Pure hypercholesterolemia, unspecified: Secondary | ICD-10-CM | POA: Diagnosis not present

## 2019-07-28 DIAGNOSIS — E119 Type 2 diabetes mellitus without complications: Secondary | ICD-10-CM | POA: Diagnosis present

## 2019-07-28 DIAGNOSIS — Z809 Family history of malignant neoplasm, unspecified: Secondary | ICD-10-CM | POA: Insufficient documentation

## 2019-07-28 DIAGNOSIS — Z7901 Long term (current) use of anticoagulants: Secondary | ICD-10-CM | POA: Insufficient documentation

## 2019-07-28 DIAGNOSIS — Z888 Allergy status to other drugs, medicaments and biological substances status: Secondary | ICD-10-CM | POA: Insufficient documentation

## 2019-07-28 DIAGNOSIS — Z590 Homelessness: Secondary | ICD-10-CM | POA: Insufficient documentation

## 2019-07-28 DIAGNOSIS — R197 Diarrhea, unspecified: Secondary | ICD-10-CM | POA: Diagnosis present

## 2019-07-28 DIAGNOSIS — E785 Hyperlipidemia, unspecified: Secondary | ICD-10-CM | POA: Insufficient documentation

## 2019-07-28 DIAGNOSIS — Z803 Family history of malignant neoplasm of breast: Secondary | ICD-10-CM | POA: Insufficient documentation

## 2019-07-28 DIAGNOSIS — K529 Noninfective gastroenteritis and colitis, unspecified: Secondary | ICD-10-CM

## 2019-07-28 DIAGNOSIS — I4821 Permanent atrial fibrillation: Secondary | ICD-10-CM | POA: Diagnosis not present

## 2019-07-28 DIAGNOSIS — F329 Major depressive disorder, single episode, unspecified: Secondary | ICD-10-CM | POA: Insufficient documentation

## 2019-07-28 DIAGNOSIS — Z8041 Family history of malignant neoplasm of ovary: Secondary | ICD-10-CM | POA: Insufficient documentation

## 2019-07-28 DIAGNOSIS — Z720 Tobacco use: Secondary | ICD-10-CM | POA: Diagnosis present

## 2019-07-28 DIAGNOSIS — I130 Hypertensive heart and chronic kidney disease with heart failure and stage 1 through stage 4 chronic kidney disease, or unspecified chronic kidney disease: Secondary | ICD-10-CM | POA: Diagnosis not present

## 2019-07-28 DIAGNOSIS — F17210 Nicotine dependence, cigarettes, uncomplicated: Secondary | ICD-10-CM | POA: Insufficient documentation

## 2019-07-28 DIAGNOSIS — Z8249 Family history of ischemic heart disease and other diseases of the circulatory system: Secondary | ICD-10-CM | POA: Insufficient documentation

## 2019-07-28 LAB — TROPONIN I (HIGH SENSITIVITY)
Troponin I (High Sensitivity): 32 ng/L — ABNORMAL HIGH (ref <18)
Troponin I (High Sensitivity): 32 ng/L — ABNORMAL HIGH (ref <18)

## 2019-07-28 LAB — CBC
HCT: 41.1 % (ref 39.0–52.0)
Hemoglobin: 13.8 g/dL (ref 13.0–17.0)
MCH: 31.4 pg (ref 26.0–34.0)
MCHC: 33.6 g/dL (ref 30.0–36.0)
MCV: 93.4 fL (ref 80.0–100.0)
Platelets: 242 10*3/uL (ref 150–400)
RBC: 4.4 MIL/uL (ref 4.22–5.81)
RDW: 13.3 % (ref 11.5–15.5)
WBC: 4.8 10*3/uL (ref 4.0–10.5)
nRBC: 0 % (ref 0.0–0.2)

## 2019-07-28 LAB — BASIC METABOLIC PANEL
Anion gap: 14 (ref 5–15)
BUN: 11 mg/dL (ref 6–20)
CO2: 23 mmol/L (ref 22–32)
Calcium: 7.9 mg/dL — ABNORMAL LOW (ref 8.9–10.3)
Chloride: 92 mmol/L — ABNORMAL LOW (ref 98–111)
Creatinine, Ser: 1.15 mg/dL (ref 0.61–1.24)
GFR calc Af Amer: >60 mL/min (ref >60)
GFR calc non Af Amer: >60 mL/min (ref >60)
Glucose, Bld: 136 mg/dL — ABNORMAL HIGH (ref 70–99)
Potassium: 2.8 mmol/L — ABNORMAL LOW (ref 3.5–5.1)
Sodium: 129 mmol/L — ABNORMAL LOW (ref 135–145)

## 2019-07-28 IMAGING — DX DG CHEST 2V
2 series · 2 of 2 positions shown · non-contrast
Comparison: Radiographs 07/20/2018 and 03/16/2018.

CLINICAL DATA: Chest pain with shortness of breath.

EXAM:
CHEST - 2 VIEW

[chest lat]
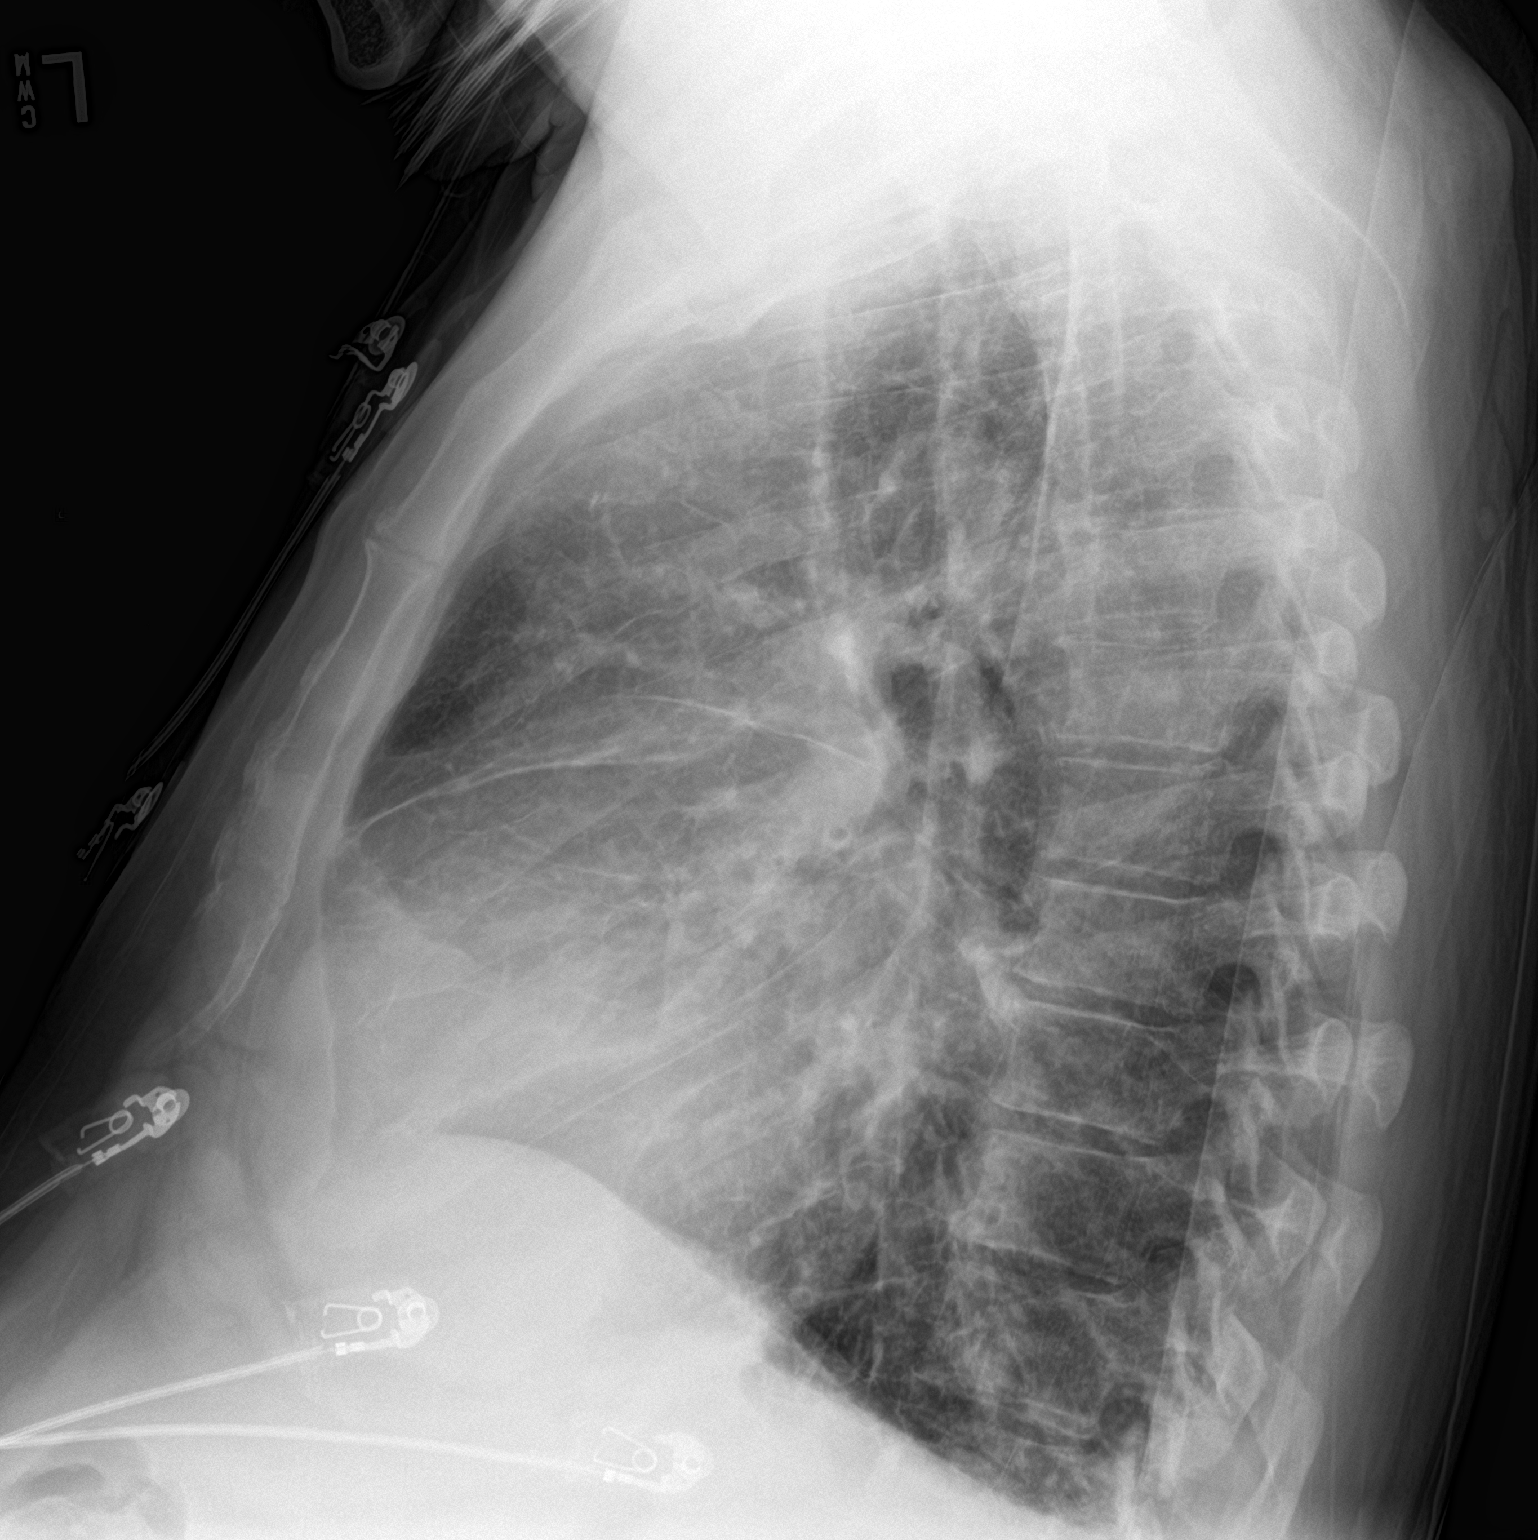

[chest ap]
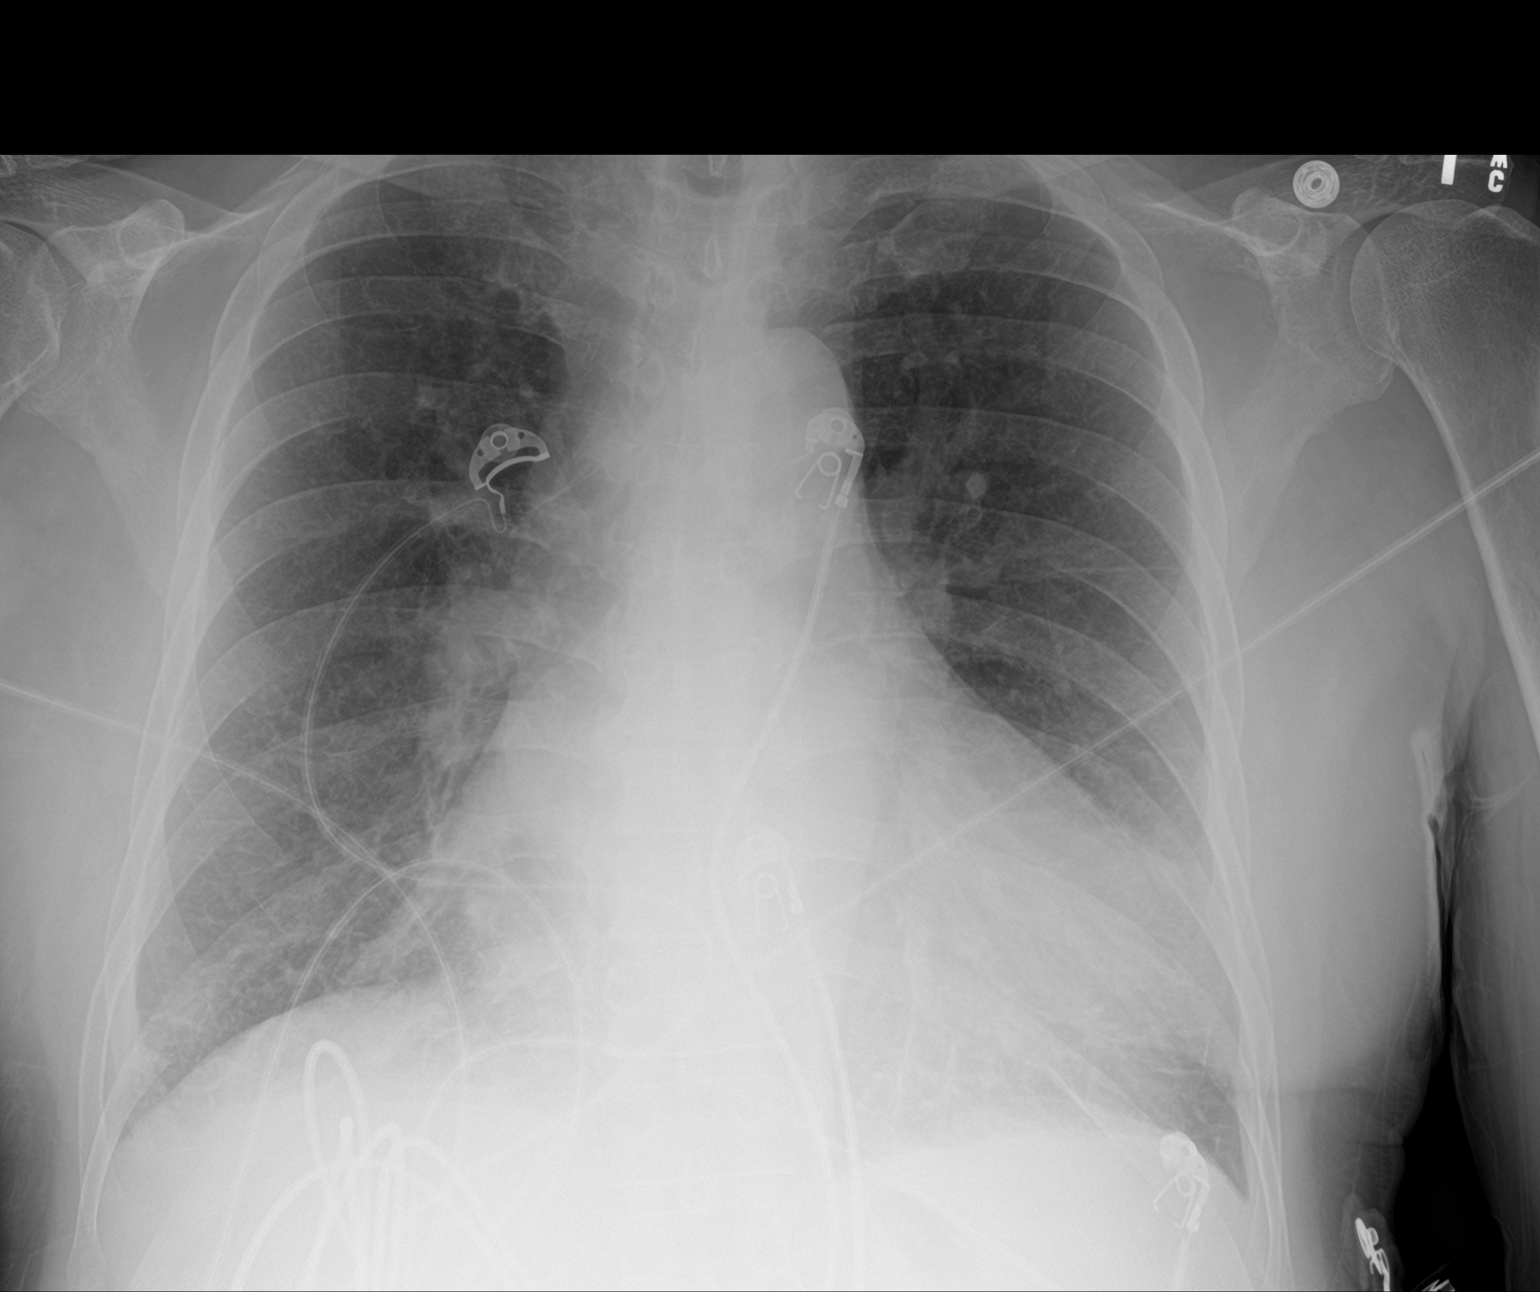

[2 of 2 positions shown; findings below may reference images not displayed]

FINDINGS: Stable cardiomegaly and mediastinal contours. There is chronic
central airway and fissural thickening, but no overt pulmonary
edema, confluent airspace opacity or significant pleural effusion.
The costophrenic angles are incompletely visualized on the lateral
projection. No acute osseous findings are seen. Telemetry leads
overlie the chest.
IMPRESSION: Similar appearance of the chest with cardiomegaly, central airway
and fissural thickening. No overt pulmonary edema.

## 2019-07-28 MED ORDER — SODIUM CHLORIDE 0.9% FLUSH
3.0000 mL | Freq: Once | INTRAVENOUS | Status: AC
  Administered 2019-07-29: 02:00:00 3 mL via INTRAVENOUS

## 2019-07-28 NOTE — ED Triage Notes (Signed)
Pt presents with multiple complaints. C/o chest pain, shortness of breath, abdominal pain and diarrhea. States he was treated for e.coli, but diarrhea is worse.

## 2019-07-29 ENCOUNTER — Emergency Department (HOSPITAL_COMMUNITY): Payer: Medicaid Other

## 2019-07-29 DIAGNOSIS — R197 Diarrhea, unspecified: Secondary | ICD-10-CM | POA: Diagnosis not present

## 2019-07-29 DIAGNOSIS — I10 Essential (primary) hypertension: Secondary | ICD-10-CM

## 2019-07-29 DIAGNOSIS — Z720 Tobacco use: Secondary | ICD-10-CM

## 2019-07-29 DIAGNOSIS — E871 Hypo-osmolality and hyponatremia: Secondary | ICD-10-CM

## 2019-07-29 DIAGNOSIS — E876 Hypokalemia: Secondary | ICD-10-CM | POA: Diagnosis not present

## 2019-07-29 DIAGNOSIS — E118 Type 2 diabetes mellitus with unspecified complications: Secondary | ICD-10-CM

## 2019-07-29 DIAGNOSIS — F1910 Other psychoactive substance abuse, uncomplicated: Secondary | ICD-10-CM

## 2019-07-29 LAB — RAPID URINE DRUG SCREEN, HOSP PERFORMED
Amphetamines: NOT DETECTED
Barbiturates: NOT DETECTED
Benzodiazepines: NOT DETECTED
Cocaine: NOT DETECTED
Opiates: POSITIVE — AB
Tetrahydrocannabinol: NOT DETECTED

## 2019-07-29 LAB — URINALYSIS, ROUTINE W REFLEX MICROSCOPIC
Bacteria, UA: NONE SEEN
Bilirubin Urine: NEGATIVE
Glucose, UA: NEGATIVE mg/dL
Ketones, ur: NEGATIVE mg/dL
Leukocytes,Ua: NEGATIVE
Nitrite: NEGATIVE
Protein, ur: 100 mg/dL — AB
Specific Gravity, Urine: 1.033 — ABNORMAL HIGH (ref 1.005–1.030)
pH: 6 (ref 5.0–8.0)

## 2019-07-29 LAB — GASTROINTESTINAL PANEL BY PCR, STOOL (REPLACES STOOL CULTURE)

## 2019-07-29 LAB — BRAIN NATRIURETIC PEPTIDE: B Natriuretic Peptide: 2012 pg/mL — ABNORMAL HIGH (ref 0.0–100.0)

## 2019-07-29 LAB — BASIC METABOLIC PANEL
Anion gap: 10 (ref 5–15)
BUN: 18 mg/dL (ref 6–20)
CO2: 25 mmol/L (ref 22–32)
Calcium: 7.9 mg/dL — ABNORMAL LOW (ref 8.9–10.3)
Chloride: 91 mmol/L — ABNORMAL LOW (ref 98–111)
Creatinine, Ser: 1.31 mg/dL — ABNORMAL HIGH (ref 0.61–1.24)
GFR calc Af Amer: >60 mL/min (ref >60)
GFR calc non Af Amer: 59 mL/min — ABNORMAL LOW (ref >60)
Glucose, Bld: 139 mg/dL — ABNORMAL HIGH (ref 70–99)
Potassium: 3.5 mmol/L (ref 3.5–5.1)
Sodium: 126 mmol/L — ABNORMAL LOW (ref 135–145)

## 2019-07-29 LAB — HEPATIC FUNCTION PANEL
ALT: 45 U/L — ABNORMAL HIGH (ref 0–44)
AST: 52 U/L — ABNORMAL HIGH (ref 15–41)
Albumin: 2.8 g/dL — ABNORMAL LOW (ref 3.5–5.0)
Alkaline Phosphatase: 89 U/L (ref 38–126)
Bilirubin, Direct: 0.2 mg/dL (ref 0.0–0.2)
Indirect Bilirubin: 0.6 mg/dL (ref 0.3–0.9)
Total Bilirubin: 0.8 mg/dL (ref 0.3–1.2)
Total Protein: 7.5 g/dL (ref 6.5–8.1)

## 2019-07-29 LAB — C DIFFICILE QUICK SCREEN W PCR REFLEX
C Diff antigen: NEGATIVE
C Diff interpretation: NOT DETECTED
C Diff toxin: NEGATIVE

## 2019-07-29 LAB — MAGNESIUM
Magnesium: 1.3 mg/dL — ABNORMAL LOW (ref 1.7–2.4)
Magnesium: 2.3 mg/dL (ref 1.7–2.4)

## 2019-07-29 LAB — SARS CORONAVIRUS 2 (TAT 6-24 HRS): SARS Coronavirus 2: NEGATIVE

## 2019-07-29 LAB — GLUCOSE, CAPILLARY
Glucose-Capillary: 204 mg/dL — ABNORMAL HIGH (ref 70–99)
Glucose-Capillary: 121 mg/dL — ABNORMAL HIGH (ref 70–99)
Glucose-Capillary: 146 mg/dL — ABNORMAL HIGH (ref 70–99)

## 2019-07-29 LAB — LIPASE, BLOOD: Lipase: 36 U/L (ref 11–51)

## 2019-07-29 MED ORDER — GABAPENTIN 300 MG PO CAPS
600.0000 mg | ORAL_CAPSULE | Freq: Two times a day (BID) | ORAL | Status: DC
  Administered 2019-07-29 – 2019-07-31 (×5): 600 mg via ORAL
  Filled 2019-07-29 (×5): qty 2

## 2019-07-29 MED ORDER — DILTIAZEM HCL 60 MG PO TABS
30.0000 mg | ORAL_TABLET | Freq: Two times a day (BID) | ORAL | Status: DC
  Administered 2019-07-29 – 2019-07-31 (×4): 30 mg via ORAL
  Filled 2019-07-29 (×6): qty 1

## 2019-07-29 MED ORDER — LORAZEPAM 1 MG PO TABS: 1.0000 mg | ORAL_TABLET | Freq: Four times a day (QID) | ORAL | Status: DC | PRN

## 2019-07-29 MED ORDER — MORPHINE SULFATE (PF) 2 MG/ML IV SOLN
2.0000 mg | INTRAVENOUS | Status: DC | PRN
  Administered 2019-07-29 – 2019-07-31 (×15): 2 mg via INTRAVENOUS
  Filled 2019-07-29 (×15): qty 1

## 2019-07-29 MED ORDER — LORAZEPAM 2 MG/ML IJ SOLN: 1.0000 mg | Freq: Four times a day (QID) | INTRAMUSCULAR | Status: DC | PRN

## 2019-07-29 MED ORDER — THIAMINE HCL 100 MG/ML IJ SOLN
100.0000 mg | Freq: Every day | INTRAMUSCULAR | Status: DC
  Filled 2019-07-29: qty 2

## 2019-07-29 MED ORDER — ALBUTEROL SULFATE (2.5 MG/3ML) 0.083% IN NEBU: 3.0000 mL | INHALATION_SOLUTION | Freq: Four times a day (QID) | RESPIRATORY_TRACT | Status: DC | PRN

## 2019-07-29 MED ORDER — VITAMIN B-1 100 MG PO TABS
100.0000 mg | ORAL_TABLET | Freq: Every day | ORAL | Status: DC
  Administered 2019-07-29 – 2019-07-30 (×2): 100 mg via ORAL
  Filled 2019-07-29 (×2): qty 1

## 2019-07-29 MED ORDER — CALCIUM GLUCONATE-NACL 1-0.675 GM/50ML-% IV SOLN
1.0000 g | Freq: Once | INTRAVENOUS | Status: AC
  Administered 2019-07-29: 1000 mg via INTRAVENOUS
  Filled 2019-07-29: qty 50

## 2019-07-29 MED ORDER — POTASSIUM CHLORIDE CRYS ER 20 MEQ PO TBCR
40.0000 meq | EXTENDED_RELEASE_TABLET | Freq: Once | ORAL | Status: AC
  Administered 2019-07-29: 02:00:00 40 meq via ORAL
  Filled 2019-07-29: qty 2

## 2019-07-29 MED ORDER — SODIUM CHLORIDE 0.9 % IV SOLN
Freq: Once | INTRAVENOUS | Status: AC
  Administered 2019-07-29: 18:00:00 via INTRAVENOUS

## 2019-07-29 MED ORDER — INSULIN ASPART 100 UNIT/ML ~~LOC~~ SOLN
0.0000 [IU] | Freq: Three times a day (TID) | SUBCUTANEOUS | Status: DC
  Administered 2019-07-29 – 2019-07-30 (×3): 3 [IU] via SUBCUTANEOUS
  Administered 2019-07-30 (×2): 2 [IU] via SUBCUTANEOUS
  Administered 2019-07-31: 3 [IU] via SUBCUTANEOUS

## 2019-07-29 MED ORDER — LORAZEPAM 2 MG/ML IJ SOLN: 0.0000 mg | Freq: Four times a day (QID) | INTRAMUSCULAR | Status: AC

## 2019-07-29 MED ORDER — INSULIN ASPART 100 UNIT/ML ~~LOC~~ SOLN: 0.0000 [IU] | Freq: Every day | SUBCUTANEOUS | Status: DC

## 2019-07-29 MED ORDER — ADULT MULTIVITAMIN W/MINERALS CH
1.0000 | ORAL_TABLET | Freq: Every day | ORAL | Status: DC
  Administered 2019-07-29 – 2019-07-31 (×3): 1 via ORAL
  Filled 2019-07-29 (×3): qty 1

## 2019-07-29 MED ORDER — SODIUM CHLORIDE 0.9% FLUSH
3.0000 mL | Freq: Two times a day (BID) | INTRAVENOUS | Status: DC
  Administered 2019-07-29 – 2019-07-31 (×5): 3 mL via INTRAVENOUS

## 2019-07-29 MED ORDER — ONDANSETRON HCL 4 MG/2ML IJ SOLN: 4.0000 mg | Freq: Four times a day (QID) | INTRAMUSCULAR | Status: DC | PRN

## 2019-07-29 MED ORDER — FOLIC ACID 1 MG PO TABS
1.0000 mg | ORAL_TABLET | Freq: Every day | ORAL | Status: DC
  Administered 2019-07-29 – 2019-07-30 (×2): 1 mg via ORAL
  Filled 2019-07-29 (×2): qty 1

## 2019-07-29 MED ORDER — APIXABAN 5 MG PO TABS
5.0000 mg | ORAL_TABLET | Freq: Two times a day (BID) | ORAL | Status: DC
  Administered 2019-07-29 – 2019-07-31 (×5): 5 mg via ORAL
  Filled 2019-07-29 (×6): qty 1

## 2019-07-29 MED ORDER — MAGNESIUM SULFATE 2 GM/50ML IV SOLN
2.0000 g | Freq: Once | INTRAVENOUS | Status: AC
  Administered 2019-07-29: 2 g via INTRAVENOUS
  Filled 2019-07-29: qty 50

## 2019-07-29 MED ORDER — SODIUM CHLORIDE 0.9 % IV SOLN: Freq: Once | INTRAVENOUS | Status: DC

## 2019-07-29 MED ORDER — MORPHINE SULFATE (PF) 4 MG/ML IV SOLN
4.0000 mg | Freq: Once | INTRAVENOUS | Status: AC
  Administered 2019-07-29: 02:00:00 4 mg via INTRAVENOUS
  Filled 2019-07-29: qty 1

## 2019-07-29 MED ORDER — HYDROMORPHONE HCL 1 MG/ML IJ SOLN
1.0000 mg | Freq: Once | INTRAMUSCULAR | Status: AC
  Administered 2019-07-29: 08:00:00 1 mg via INTRAVENOUS
  Filled 2019-07-29: qty 1

## 2019-07-29 MED ORDER — LORAZEPAM 2 MG/ML IJ SOLN: 0.0000 mg | Freq: Two times a day (BID) | INTRAMUSCULAR | Status: DC

## 2019-07-29 MED ORDER — ONDANSETRON HCL 4 MG/2ML IJ SOLN
4.0000 mg | Freq: Once | INTRAMUSCULAR | Status: AC
  Administered 2019-07-29: 4 mg via INTRAVENOUS
  Filled 2019-07-29: qty 2

## 2019-07-29 MED ORDER — POTASSIUM CHLORIDE 10 MEQ/100ML IV SOLN
10.0000 meq | INTRAVENOUS | Status: AC
  Administered 2019-07-29 (×2): 10 meq via INTRAVENOUS
  Filled 2019-07-29 (×2): qty 100

## 2019-07-29 MED ORDER — POTASSIUM CHLORIDE CRYS ER 20 MEQ PO TBCR
40.0000 meq | EXTENDED_RELEASE_TABLET | Freq: Every day | ORAL | Status: DC
  Administered 2019-07-30 – 2019-07-31 (×2): 40 meq via ORAL
  Filled 2019-07-29 (×2): qty 2

## 2019-07-29 MED ORDER — IOHEXOL 300 MG/ML  SOLN
100.0000 mL | Freq: Once | INTRAMUSCULAR | Status: AC | PRN
  Administered 2019-07-29: 03:00:00 100 mL via INTRAVENOUS

## 2019-07-29 MED ORDER — NICOTINE 21 MG/24HR TD PT24
21.0000 mg | MEDICATED_PATCH | Freq: Every day | TRANSDERMAL | Status: DC
  Administered 2019-07-29 – 2019-07-31 (×3): 21 mg via TRANSDERMAL
  Filled 2019-07-29 (×3): qty 1

## 2019-07-29 MED ORDER — ONDANSETRON HCL 4 MG PO TABS: 4.0000 mg | ORAL_TABLET | Freq: Four times a day (QID) | ORAL | Status: DC | PRN

## 2019-07-29 MED ORDER — TRAMADOL HCL 50 MG PO TABS
50.0000 mg | ORAL_TABLET | Freq: Once | ORAL | Status: AC
  Administered 2019-07-30: 50 mg via ORAL
  Filled 2019-07-29: qty 1

## 2019-07-29 MED ORDER — DICYCLOMINE HCL 10 MG PO CAPS: 10.0000 mg | ORAL_CAPSULE | Freq: Three times a day (TID) | ORAL | Status: DC | PRN

## 2019-07-29 NOTE — ED Notes (Signed)
Breakfast tray ordered 
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
## 2019-07-29 NOTE — Progress Notes (Signed)
Patient admitted to room 5w18 from ED HD2 s/p chest pain and diarrhea. Alert oriented x4. Respirations even unlabored diminished breath sounds encouraged deep breathing/coughing. Heart rate regular s1s2 auscultated Bowel sounds active x4, last bm today loose stool per patient. Ate 100 % of breakfast no nausea noted. Skin intact. Oriented to room/unit. Will continue to monitor.

## 2019-07-29 NOTE — Discharge Instructions (Signed)

## 2019-07-29 NOTE — ED Provider Notes (Signed)
  Provider Note MRN:  [id number]  Arrival date & time: 07/29/19    ED Course and Medical Decision Making  Assumed care from Dr. Roxanne Mins at shift change.  Continued diarrhea, some electrolyte disturbance, and continued chest pain.  Multiple cardiac risk factors, HS troponins 32, per protocol will request admission.  Admitted to hospitalist service for further care.  Final Clinical Impressions(s) / ED Diagnoses     ICD-10-CM   1. Intractable diarrhea  R19.7   2. Hypokalemia due to excessive gastrointestinal loss of potassium  E87.6   3. Hypomagnesemia  E83.42   4. Elevated transaminase level  R74.0     ED Discharge Orders    None       Barth Kirks. [place] Small, [place] [email or web address]    Maudie Flakes, MD 07/29/19 [phone]
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
## 2019-07-29 NOTE — ED Notes (Signed)
Admitting at bedside 
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
## 2019-07-29 NOTE — Plan of Care (Signed)
  Problem: Clinical Measurements: °Goal: Ability to maintain clinical measurements within normal limits will improve °Outcome: Progressing °Goal: Will remain free from infection °Outcome: Progressing °  °Problem: Elimination: °Goal: Will not experience complications related to bowel motility °Outcome: Progressing °  °

## 2019-07-30 DIAGNOSIS — R197 Diarrhea, unspecified: Secondary | ICD-10-CM | POA: Diagnosis not present

## 2019-07-30 LAB — CBC
HCT: 41.3 % (ref 39.0–52.0)
Hemoglobin: 13.9 g/dL (ref 13.0–17.0)
MCH: 31.2 pg (ref 26.0–34.0)
MCHC: 33.7 g/dL (ref 30.0–36.0)
MCV: 92.8 fL (ref 80.0–100.0)
Platelets: 219 10*3/uL (ref 150–400)
RBC: 4.45 MIL/uL (ref 4.22–5.81)
RDW: 13.4 % (ref 11.5–15.5)
WBC: 5.2 10*3/uL (ref 4.0–10.5)
nRBC: 0 % (ref 0.0–0.2)

## 2019-07-30 LAB — BASIC METABOLIC PANEL
Anion gap: 11 (ref 5–15)
BUN: 20 mg/dL (ref 6–20)
CO2: 28 mmol/L (ref 22–32)
Calcium: 8.5 mg/dL — ABNORMAL LOW (ref 8.9–10.3)
Chloride: 90 mmol/L — ABNORMAL LOW (ref 98–111)
Creatinine, Ser: 1.41 mg/dL — ABNORMAL HIGH (ref 0.61–1.24)
GFR calc Af Amer: >60 mL/min (ref >60)
GFR calc non Af Amer: 54 mL/min — ABNORMAL LOW (ref >60)
Glucose, Bld: 161 mg/dL — ABNORMAL HIGH (ref 70–99)
Potassium: 3.7 mmol/L (ref 3.5–5.1)
Sodium: 129 mmol/L — ABNORMAL LOW (ref 135–145)

## 2019-07-30 LAB — GLUCOSE, CAPILLARY
Glucose-Capillary: 171 mg/dL — ABNORMAL HIGH (ref 70–99)
Glucose-Capillary: 144 mg/dL — ABNORMAL HIGH (ref 70–99)
Glucose-Capillary: 129 mg/dL — ABNORMAL HIGH (ref 70–99)
Glucose-Capillary: 174 mg/dL — ABNORMAL HIGH (ref 70–99)

## 2019-07-30 LAB — MAGNESIUM: Magnesium: 2 mg/dL (ref 1.7–2.4)

## 2019-07-30 MED ORDER — TORSEMIDE 20 MG PO TABS
60.0000 mg | ORAL_TABLET | Freq: Two times a day (BID) | ORAL | Status: DC
  Administered 2019-07-30 – 2019-07-31 (×2): 60 mg via ORAL
  Filled 2019-07-30 (×2): qty 3

## 2019-07-30 MED ORDER — PANTOPRAZOLE SODIUM 40 MG PO TBEC
40.0000 mg | DELAYED_RELEASE_TABLET | Freq: Every day | ORAL | Status: DC
  Administered 2019-07-30 – 2019-07-31 (×2): 40 mg via ORAL
  Filled 2019-07-30 (×2): qty 1

## 2019-07-30 MED ORDER — FOLIC ACID 1 MG PO TABS
6.0000 mg | ORAL_TABLET | Freq: Every day | ORAL | Status: DC
  Administered 2019-07-30 – 2019-07-31 (×2): 6 mg via ORAL
  Filled 2019-07-30 (×2): qty 6

## 2019-07-30 MED ORDER — ATORVASTATIN CALCIUM 40 MG PO TABS
40.0000 mg | ORAL_TABLET | Freq: Every day | ORAL | Status: DC
  Administered 2019-07-30 – 2019-07-31 (×2): 40 mg via ORAL
  Filled 2019-07-30 (×2): qty 1

## 2019-07-30 MED ORDER — DULOXETINE HCL 60 MG PO CPEP
60.0000 mg | ORAL_CAPSULE | Freq: Every day | ORAL | Status: DC
  Administered 2019-07-30 – 2019-07-31 (×2): 60 mg via ORAL
  Filled 2019-07-30 (×2): qty 1

## 2019-07-30 MED ORDER — FERROUS SULFATE 325 (65 FE) MG PO TABS
325.0000 mg | ORAL_TABLET | Freq: Every day | ORAL | Status: DC
  Administered 2019-07-31: 325 mg via ORAL
  Filled 2019-07-30: qty 1

## 2019-07-30 MED ORDER — VITAMIN B-1 100 MG PO TABS
100.0000 mg | ORAL_TABLET | Freq: Every day | ORAL | Status: DC
  Administered 2019-07-31: 09:00:00 100 mg via ORAL
  Filled 2019-07-30: qty 1

## 2019-07-30 MED ORDER — MIRTAZAPINE 15 MG PO TABS
15.0000 mg | ORAL_TABLET | Freq: Every day | ORAL | Status: DC
  Administered 2019-07-30: 21:00:00 15 mg via ORAL
  Filled 2019-07-30: qty 1

## 2019-07-30 NOTE — Progress Notes (Signed)
PROGRESS NOTE    Brad [name]  CXK:[id number] DOB: [birth year] DOA: 07/28/2019 PCP: Charlott Rakes, MD   Brief Narrative:  Brad [name] is a 58 y.o. male with medical history significant of nonischemic cardiomyopathy, chronic combined systolic and diastolic CHF last EF noted to be 30 to 35%, hypertension, atrial flutter on Eliquis, noncompliance, drug use, hyperlipidemia, hepatitis C, diabetes mellitus type 2, atrial letter, liver cirrhosis, and alcohol abuse who presented to ED with complaint of  generalized abdominal pain and diarrhea associated with nausea and one episode of nonbloody vomiting.  He also endorsed pressure-like substernal chest pain, diaphoresis, chills, and some shortness of breath.    He had recently been hospitalized from 7/26 through 7/31 with a heart failure exacerbation found to have toxigenic E. coli in stools treated with antibiotics of Levaquin and metronidazole which course was completed in the hospital.  However, patient complains of still having 8-10 watery bowel movements per day. He claims that he has continued taking Eliquis.   he reports continuing to drink a "40" every other day and continues to smoke cigarettes although he reports he is trying to quit both.  Upon arrival to the emergency department seen to be afebrile and hemodynamically stable.  Labs revealed sodium 129, potassium 2.8, BUN 11, creatinine 1.15, calcium 7.9, magnesium 1.3, lipase 36, AST 52, ALT 45, and high-sensitivity troponin 32.  C. difficile screening was negative.  CT scan of the abdomen and pelvis did not reveal any acute pathology.  Patient was given 40 mEq of potassium chloride p.o., potassium chloride 20 mEq IV, 2 g of magnesium sulfate, and pain medication.  He was admitted under hospitalist service with no antibiotics.  Subjective: Patient seen and examined.  He still continues to have diarrhea.  I saw him around 11:00 and he said that he had had almost 4 episodes of diarrhea this  morning.  Also has upper abdominal/chest pain.  He describes that as pressure.  Nonradiating with no aggravating or relieving factor.  Denies any shortness of breath or any other complaint.  Assessment & Plan:   Principal Problem:   Diarrhea Active Problems:   Polysubstance abuse ([place])   Essential hypertension   Type 2 diabetes mellitus with complication, without long-term current use of insulin (HCC)   Hypokalemia   Hyponatremia   Tobacco abuse   Hypomagnesemia   Acute gastroenteritis: According to patient, he has been having diarrhea for about a month.  He was diagnosed with ETEC and EPEC E. coli during recent hospitalization treated with antibiotics.  No fever.  His C. difficile as well as GI pathogen panel is negative this time.  Based on this, he likely has viral gastroenteritis which hopefully will resolve.  We will try to keep him hydrated with IV fluids in the meantime.  Chest pain/abdominal pain: Wonder if his abdominal pain is radiating to the chest pain.  EKG with no acute ST-T wave changes and troponins although slightly elevated but flat.  Has had echo done recently in June 2020 which showed ejection fraction of 30 to 35%.  Also had right-sided reduced systolic function.  Monitor on telemetry.  Resume home dose of diuretic.  Hypokalemia: Admitting potassium 2.8.  Currently resolved.  Hypomagnesemia: Admitting magnesium 1.3.  Received magnesium sulfate.  Currently normal.  Polysubstance abuse: Has prior history of cocaine, heroin and tobacco as well as alcohol abuse.  He reports that he is cutting back.  UDS this time only positive for opiates and not cocaine.  Counseling provided.  Chronic combined systolic and diastolic congestive heart failure: Euvolemic.  Resume home diuretics.  Essential hypertension: Interestingly when I saw him this morning, he requested me to resume his home doses of antihypertensives and all the medications but according to pharmacy, he has not been  taking it.  His blood pressure is stable however I will resume him on amlodipine.  Chronic kidney disease stage III: At baseline.  History of paroxysmal atrial fibrillation: Currently in sinus rhythm.  Not taking any Cardizem at home.  Continue Eliquis and Cardizem.  Type 2 diabetes mellitus: Continue SSI.  Hyponatremia: Presented with 129 sodium.  That seems to be his baseline placed on the chart review.  DVT prophylaxis: Eliquis Code Status: Full code Family Communication: None present Disposition Plan: TBD  Objective: Vitals:   07/29/19 1000 07/29/19 2247 07/30/19 0640 07/30/19 1255  BP: 118/90 108/85 108/86 (!) 116/93  Pulse: 78 83 79 85  Resp: 17 17 16 16   Temp: 97.6 F (36.4 C) 97.6 F (36.4 C) 97.7 F (36.5 C) 98.6 F (37 C)  TempSrc: Oral Oral Oral Axillary  SpO2: 97% 100% 100% 100%    Intake/Output Summary (Last 24 hours) at 07/30/2019 1433 Last data filed at 07/30/2019 0600 Gross per 24 hour  Intake 1043.44 ml  Output 3 ml  Net 1040.44 ml   There were no vitals filed for this visit.  Consultants:   None  Procedures:   None  Antimicrobials:   None  Objective: Vitals:   07/29/19 1000 07/29/19 2247 07/30/19 0640 07/30/19 1255  BP: 118/90 108/85 108/86 (!) 116/93  Pulse: 78 83 79 85  Resp: 17 17 16 16   Temp: 97.6 F (36.4 C) 97.6 F (36.4 C) 97.7 F (36.5 C) 98.6 F (37 C)  TempSrc: Oral Oral Oral Axillary  SpO2: 97% 100% 100% 100%    Intake/Output Summary (Last 24 hours) at 07/30/2019 1433 Last data filed at 07/30/2019 0600 Gross per 24 hour  Intake 1043.44 ml  Output 3 ml  Net 1040.44 ml   There were no vitals filed for this visit.  Examination:  General exam: Appears calm and comfortable  Respiratory system: Clear to auscultation. Respiratory effort normal. Cardiovascular system: S1 & S2 heard, RRR. No JVD, murmurs, rubs, gallops or clicks. No pedal edema. Gastrointestinal system: Abdomen is nondistended, soft and ?  Generalized  tenderness with voluntary guarding. No organomegaly or masses felt. Normal bowel sounds heard. Central nervous system: Alert and oriented. No focal neurological deficits. Extremities: Symmetric 5 x 5 power. Skin: No rashes, lesions or ulcers Psychiatry: Judgement and insight appear normal. Mood & affect appropriate.    Data Reviewed: I have personally reviewed following labs and imaging studies  CBC: Recent Labs  Lab 07/28/19 1607 07/30/19 0232  WBC 4.8 5.2  HGB 13.8 13.9  HCT 41.1 41.3  MCV 93.4 92.8  PLT 242 696   Basic Metabolic Panel: Recent Labs  Lab 07/28/19 1607 07/28/19 1947 07/29/19 1807 07/30/19 0232  NA 129*  --  126* 129*  K 2.8*  --  3.5 3.7  CL 92*  --  91* 90*  CO2 23  --  25 28  GLUCOSE 136*  --  139* 161*  BUN 11  --  18 20  CREATININE 1.15  --  1.31* 1.41*  CALCIUM 7.9*  --  7.9* 8.5*  MG  --  1.3* 2.3 2.0   GFR: Estimated Creatinine Clearance: 59.8 mL/min (A) (by C-G formula based on SCr of 1.41 mg/dL (H)). Liver  Function Tests: Recent Labs  Lab 07/28/19 1947  AST 52*  ALT 45*  ALKPHOS 89  BILITOT 0.8  PROT 7.5  ALBUMIN 2.8*   Recent Labs  Lab 07/28/19 1947  LIPASE 36   No results for input(s): AMMONIA in the last 168 hours. Coagulation Profile: No results for input(s): INR, PROTIME in the last 168 hours. Cardiac Enzymes: No results for input(s): CKTOTAL, CKMB, CKMBINDEX, TROPONINI in the last 168 hours. BNP (last 3 results) No results for input(s): PROBNP in the last 8760 hours. HbA1C: No results for input(s): HGBA1C in the last 72 hours. CBG: Recent Labs  Lab 07/29/19 1154 07/29/19 1644 07/29/19 2244 07/30/19 0827 07/30/19 1251  GLUCAP 204* 121* 146* 171* 144*   Lipid Profile: No results for input(s): CHOL, HDL, LDLCALC, TRIG, CHOLHDL, LDLDIRECT in the last 72 hours. Thyroid Function Tests: No results for input(s): TSH, T4TOTAL, FREET4, T3FREE, THYROIDAB in the last 72 hours. Anemia Panel: No results for input(s):  VITAMINB12, FOLATE, FERRITIN, TIBC, IRON, RETICCTPCT in the last 72 hours. Sepsis Labs: No results for input(s): PROCALCITON, LATICACIDVEN in the last 168 hours.  Recent Results (from the past 240 hour(s))  C difficile quick scan w PCR reflex     Status: None   Collection Time: 07/29/19  3:55 AM   Specimen: STOOL  Result Value Ref Range Status   C Diff antigen NEGATIVE NEGATIVE Final   C Diff toxin NEGATIVE NEGATIVE Final   C Diff interpretation No C. difficile detected.  Final    Comment: Performed at [facility], [place] [address] [place], [place] [address]  Gastrointestinal Panel by PCR , Stool     Status: None   Collection Time: 07/29/19  3:55 AM   Specimen: STOOL  Result Value Ref Range Status   Campylobacter species NOT DETECTED NOT DETECTED Final   Plesimonas shigelloides NOT DETECTED NOT DETECTED Final   Salmonella species NOT DETECTED NOT DETECTED Final   Yersinia enterocolitica NOT DETECTED NOT DETECTED Final   Vibrio species NOT DETECTED NOT DETECTED Final   Vibrio cholerae NOT DETECTED NOT DETECTED Final   Enteroaggregative E coli (EAEC) NOT DETECTED NOT DETECTED Final   Enteropathogenic E coli (EPEC) NOT DETECTED NOT DETECTED Final   Enterotoxigenic E coli (ETEC) NOT DETECTED NOT DETECTED Final   Shiga like toxin producing E coli (STEC) NOT DETECTED NOT DETECTED Final   Shigella/Enteroinvasive E coli (EIEC) NOT DETECTED NOT DETECTED Final   Cryptosporidium NOT DETECTED NOT DETECTED Final   Cyclospora cayetanensis NOT DETECTED NOT DETECTED Final   Entamoeba histolytica NOT DETECTED NOT DETECTED Final   Giardia lamblia NOT DETECTED NOT DETECTED Final   Adenovirus F40/41 NOT DETECTED NOT DETECTED Final   Astrovirus NOT DETECTED NOT DETECTED Final   Norovirus GI/GII NOT DETECTED NOT DETECTED Final   Rotavirus A NOT DETECTED NOT DETECTED Final   Sapovirus (I, II, IV, and V) NOT DETECTED NOT DETECTED Final    Comment: Performed at [facility], [address] [place], [place] [address]  SARS CORONAVIRUS 2 Nasal Swab Aptima Multi Swab     Status: None   Collection Time: 07/29/19  7:41 AM   Specimen: Aptima Multi Swab; Nasal Swab  Result Value Ref Range Status   SARS Coronavirus 2 NEGATIVE NEGATIVE Final    Comment: (NOTE) SARS-CoV-2 target nucleic acids are NOT DETECTED. The SARS-CoV-2 RNA is generally detectable in upper and lower respiratory specimens during the acute phase of infection. Negative results do not preclude SARS-CoV-2 infection,  do not rule out co-infections with other pathogens, and should not be used as the sole basis for treatment or other patient management decisions. Negative results must be combined with clinical observations, patient history, and epidemiological information. The expected result is Negative. Fact Sheet for Patients: SugarRoll.be Fact Sheet for Healthcare Providers: https://www.woods-mathews.com/ This test is not yet approved or cleared by the [place] FDA and  has been authorized for detection and/or diagnosis of SARS-CoV-2 by FDA under an Emergency Use Authorization (EUA). This EUA will remain  in effect (meaning this test can be used) for the duration of the COVID-19 declaration under Section 56 4(b)(1) of the Act, 21 U.S.C. section 360bbb-3(b)(1), unless the authorization is terminated or revoked sooner. Performed at [facility], [place] [address], [place], [place] [address]       Radiology Studies: Dg Chest 2 View  Result Date: 07/28/2019 CLINICAL DATA:  Chest pain. Shortness of breath. EXAM: CHEST - 2 VIEW COMPARISON:  07/17/2019 FINDINGS: The cardiac silhouette remains mildly enlarged. Mild opacity is present in both lung bases, new/increased from prior. There may be trace pleural effusions bilaterally. No pneumothorax is identified. No acute osseous abnormality is seen. IMPRESSION: Mild bibasilar opacities suggesting atelectasis. Possible  trace pleural effusions. Electronically Signed   By: Logan Bores M.D.   On: 07/28/2019 16:45   Ct Abdomen Pelvis W Contrast  Result Date: 07/29/2019 CLINICAL DATA:  Abdominal pain, shortness of breath. EXAM: CT ABDOMEN AND PELVIS WITH CONTRAST TECHNIQUE: Multidetector CT imaging of the abdomen and pelvis was performed using the standard protocol following bolus administration of intravenous contrast. CONTRAST:  181mL OMNIPAQUE IOHEXOL 300 MG/ML  SOLN COMPARISON:  08/16/2018 FINDINGS: Lower chest: Bilateral pleural effusions, right greater than left. Cardiomegaly. Hepatobiliary: Nodular contours of the liver compatible with cirrhosis. No focal hepatic abnormality or biliary ductal dilatation. Gallbladder grossly unremarkable. Pancreas: No focal abnormality or ductal dilatation. Spleen: No focal abnormality.  Normal size. Adrenals/Urinary Tract: No adrenal abnormality. No focal renal abnormality. No stones or hydronephrosis. Urinary bladder is unremarkable. Stomach/Bowel: Normal appendix. Stomach, large and small bowel grossly unremarkable. Scattered colonic diverticula. No active diverticulitis. Vascular/Lymphatic: Aortic atherosclerosis. No enlarged abdominal or pelvic lymph nodes. Reproductive: Mild prostate enlargement. Other: No free fluid or free air. Musculoskeletal: No acute bony abnormality. IMPRESSION: Small bilateral pleural effusions, right slightly larger than left. This is similar to prior study. Cardiomegaly. Changes of cirrhosis. Aortic atherosclerosis. Scattered diverticulosis. Prostate enlargement. No acute findings. Electronically Signed   By: Rolm Baptise M.D.   On: 07/29/2019 03:01    Scheduled Meds:  apixaban  5 mg Oral BID   atorvastatin  40 mg Oral Daily   diltiazem  30 mg Oral BID   DULoxetine  60 mg Oral Daily   [START ON 07/31/2019] ferrous sulfate  325 mg Oral Q breakfast   folic acid  6 mg Oral Daily   gabapentin  600 mg Oral BID   insulin aspart  0-15 Units  Subcutaneous TID WC   insulin aspart  0-5 Units Subcutaneous QHS   LORazepam  0-4 mg Intravenous Q6H   Followed by   Derrill Memo ON 07/31/2019] LORazepam  0-4 mg Intravenous Q12H   mirtazapine  15 mg Oral QHS   multivitamin with minerals  1 tablet Oral Daily   nicotine  21 mg Transdermal Daily   pantoprazole  40 mg Oral Daily   potassium chloride  40 mEq Oral Daily   sodium chloride flush  3 mL Intravenous Q12H   thiamine  100 mg Oral Daily   torsemide  60 mg Oral BID   Continuous Infusions:   LOS: 0 days   Time spent: 38 minutes   Darliss Cheney, MD [facility] Pager [phone]  If 7PM-7AM, please contact night-coverage [email or web address] Password [facility] 07/30/2019, 2:33 PM

## 2019-07-31 DIAGNOSIS — R197 Diarrhea, unspecified: Secondary | ICD-10-CM | POA: Diagnosis not present

## 2019-07-31 LAB — CBC WITH DIFFERENTIAL/PLATELET
Abs Immature Granulocytes: 0 10*3/uL (ref 0.00–0.07)
Basophils Absolute: 0 10*3/uL (ref 0.0–0.1)
Basophils Relative: 1 %
Eosinophils Absolute: 0.1 10*3/uL (ref 0.0–0.5)
Eosinophils Relative: 2 %
HCT: 36.7 % — ABNORMAL LOW (ref 39.0–52.0)
Hemoglobin: 12.6 g/dL — ABNORMAL LOW (ref 13.0–17.0)
Immature Granulocytes: 0 %
Lymphocytes Relative: 17 %
Lymphs Abs: 0.7 10*3/uL (ref 0.7–4.0)
MCH: 32.1 pg (ref 26.0–34.0)
MCHC: 34.3 g/dL (ref 30.0–36.0)
MCV: 93.4 fL (ref 80.0–100.0)
Monocytes Absolute: 0.4 10*3/uL (ref 0.1–1.0)
Monocytes Relative: 11 %
Neutro Abs: 2.7 10*3/uL (ref 1.7–7.7)
Neutrophils Relative %: 69 %
Platelets: 187 10*3/uL (ref 150–400)
RBC: 3.93 MIL/uL — ABNORMAL LOW (ref 4.22–5.81)
RDW: 13.4 % (ref 11.5–15.5)
WBC: 3.9 10*3/uL — ABNORMAL LOW (ref 4.0–10.5)
nRBC: 0 % (ref 0.0–0.2)

## 2019-07-31 LAB — COMPREHENSIVE METABOLIC PANEL
ALT: 28 U/L (ref 0–44)
AST: 27 U/L (ref 15–41)
Albumin: 2.3 g/dL — ABNORMAL LOW (ref 3.5–5.0)
Alkaline Phosphatase: 77 U/L (ref 38–126)
Anion gap: 12 (ref 5–15)
BUN: 25 mg/dL — ABNORMAL HIGH (ref 6–20)
CO2: 26 mmol/L (ref 22–32)
Calcium: 8.2 mg/dL — ABNORMAL LOW (ref 8.9–10.3)
Chloride: 94 mmol/L — ABNORMAL LOW (ref 98–111)
Creatinine, Ser: 1.46 mg/dL — ABNORMAL HIGH (ref 0.61–1.24)
GFR calc Af Amer: >60 mL/min (ref >60)
GFR calc non Af Amer: 52 mL/min — ABNORMAL LOW (ref >60)
Glucose, Bld: 241 mg/dL — ABNORMAL HIGH (ref 70–99)
Potassium: 3.9 mmol/L (ref 3.5–5.1)
Sodium: 132 mmol/L — ABNORMAL LOW (ref 135–145)
Total Bilirubin: 0.5 mg/dL (ref 0.3–1.2)
Total Protein: 5.8 g/dL — ABNORMAL LOW (ref 6.5–8.1)

## 2019-07-31 LAB — GLUCOSE, CAPILLARY
Glucose-Capillary: 184 mg/dL — ABNORMAL HIGH (ref 70–99)
Glucose-Capillary: 89 mg/dL (ref 70–99)

## 2019-07-31 LAB — MAGNESIUM: Magnesium: 1.7 mg/dL (ref 1.7–2.4)

## 2019-07-31 NOTE — Discharge Summary (Signed)
Physician Discharge Summary  Brad [name] PNT:[id number] DOB: [birth year] DOA: 07/28/2019  PCP: Charlott Rakes, MD  Admit date: 07/28/2019 Discharge date: 07/31/2019  Admitted From: Home Disposition: Home  Recommendations for Outpatient Follow-up:  1. Follow up with PCP in 1-2 weeks 2. Please obtain BMP/CBC in one week 3. Please follow up on the following pending results:  Home Health: None Equipment/Devices: None  Discharge Condition: Stable CODE STATUS: Full code Diet recommendation: Cardiac  Subjective: Seen and examined this morning.  He still complained of abdominal pain but told me that he has had 4 more bowel movement since I saw him yesterday but they are more formed now.  Interestingly, there is no record of bowel movements in the I's and O's chart.  He told me that morphine is good for him.  I discussed with him either possibility of reducing the dose of morphine or eventually discharging him home today.  He requested plan to reduce the dose and to meet with social worker before he goes home.  He then asked me to prescribe him morphine or some other medication for the pain.    Brief/Interim Summary: Brad [name] a 58 y.o.malewith medical history significant ofnonischemic cardiomyopathy, chronic combined systolic and diastolic CHF last EF noted to be 30 to 35%, hypertension, atrial flutter on Eliquis, noncompliance, drug use, hyperlipidemia, hepatitis C, diabetes mellitus type 2, atrial letter, liver cirrhosis, and alcohol abuse who presented to ED with complaint of generalized abdominal pain and diarrhea associated with nausea and one episode of nonbloody vomiting.  He also endorsed pressure-like substernal chest pain, diaphoresis, chills, and some shortness of breath.   He had recently been hospitalized from 7/26 through 7/31 with a heart failure exacerbation found to have toxigenic E. coli in stools treated with antibiotics of Levaquin and metronidazole which course was  completed in the hospital. However, patient complains of still having 8-10 watery bowel movements per day.He claims that he has continued taking Eliquis. he reports continuing to drink a "40"every other day and continues to smoke cigarettes although he reports he is trying to quit both.  Upon arrival to the emergency department seen to be afebrile and hemodynamically stable.  Labs revealed sodium 129, potassium 2.8, BUN 11, creatinine 1.15, calcium 7.9, magnesium 1.3, lipase 36, AST 52, ALT 45, and high-sensitivity troponin 32. C. difficile screening was negative. CT scan of the abdomen and pelvis did not reveal any acute pathology.  Patient was given 40 mEq of potassium chloride p.o., potassium chloride 20 mEq IV,2 g of magnesium sulfate, and pain medication.  He was admitted under hospitalist service with no antibiotics.  Once again he was ruled out of C. difficile and GI pathogen panel also came back negative.  When I saw him yesterday, he complained of having abdominal pain and 4 watery bowel movements however there was no record of bowel movements in the chart.  He was continued on morphine.  When I saw him today, he once again complained of abdominal pain but told me that he ate another 4 episodes of bowel movement but now they are formed.  Once again there was no record of his bowel movements.  He requested not to reduce the dose or frequency of his IV morphine and also requested to talk to social worker before he goes home.  He then also requested me to send him home on opioid medications.  At that time, I told him that I will look into it.  I then checked [place] and  found out that the last time he was prescribed opioids was oxycodone 5 mg only 10 tablets on 06/10/2019 which was approximately 2 months ago however his UDS during this admission just 2 days ago is still positive with opioids which of course is a red flag and raises high suspicion of his drug abuse/drug-seeking behavior  and he possibly is buying the drugs on the street.  Based on this extended inquiry it is obvious that he is malingering and his symptoms are also perhaps unreal. I explained to him that I am not able to prescribe him any opioids.  All his medications have been continued.  Discharge Diagnoses:  Principal Problem:   Diarrhea Active Problems:   Polysubstance abuse ([place])   Essential hypertension   Type 2 diabetes mellitus with complication, without long-term current use of insulin (HCC)   Hypokalemia   Hyponatremia   Tobacco abuse   Hypomagnesemia    Discharge Instructions  Discharge Instructions    Discharge patient   Complete by: As directed    Discharge disposition: 01-Home or Self Care   Discharge patient date: 07/31/2019     Allergies as of 07/31/2019      Reactions   Angiotensin Receptor Blockers Anaphylaxis, Other (See Comments)   (Angioedema also with Lisinopril, therefore ARB's are contraindicated)   Lisinopril Anaphylaxis, Other (See Comments)   Throat swells   Pamelor [nortriptyline Hcl] Anaphylaxis, Swelling   Throat swells      Medication List    TAKE these medications   Accu-Chek FastClix Lancets Misc Use as directed three times daily   Accu-Chek Guide w/Device Kit 1 each by Does not apply route 3 (three) times daily.   albuterol 108 (90 Base) MCG/ACT inhaler Commonly known as: VENTOLIN HFA Inhale 2 puffs into the lungs every 6 (six) hours as needed for wheezing or shortness of breath.   apixaban 5 MG Tabs tablet Commonly known as: Eliquis Take 1 tablet (5 mg total) by mouth 2 (two) times daily.   atorvastatin 40 MG tablet Commonly known as: LIPITOR Take 1 tablet (40 mg total) by mouth daily.   dicyclomine 10 MG capsule Commonly known as: Bentyl Take 1 capsule (10 mg total) by mouth 3 (three) times daily as needed for up to 14 days for spasms (abdominal pain/cramps).   diltiazem 30 MG tablet Commonly known as: CARDIZEM Take 1 tablet (30 mg total) by  mouth 2 (two) times daily.   DULoxetine 60 MG capsule Commonly known as: Cymbalta Take 1 capsule (60 mg total) by mouth daily.   Easy Comfort Pen Needles 31G X 8 MM Misc Generic drug: Insulin Pen Needle USE AS DIRECTED TWICE A DAY   ferrous sulfate 325 (65 FE) MG tablet Take 1 tablet (325 mg total) by mouth daily with breakfast.   folic acid 1 MG tablet Commonly known as: FOLVITE Take 6 tablets (6 mg total) by mouth daily.   gabapentin 300 MG capsule Commonly known as: NEURONTIN Take 2 capsules (600 mg total) by mouth 2 (two) times daily.   glucose blood test strip Commonly known as: Accu-Chek Guide Use as directed three times daily   Lantus SoloStar 100 UNIT/ML Solostar Pen Generic drug: Insulin Glargine Inject 8-14 Units into the skin daily. Start with 8 units and titrate per home BG checks (if >200 persistently) as discussed at discharge   Magnesium Oxide 400 MG Caps Take 1 capsule (400 mg total) by mouth 2 (two) times a day.   methocarbamol 500 MG tablet Commonly known  as: ROBAXIN Take 1 tablet (500 mg total) by mouth 2 (two) times daily.   mirtazapine 15 MG tablet Commonly known as: Remeron Take 1 tablet (15 mg total) by mouth at bedtime.   multivitamin with minerals Tabs tablet Take 1 tablet by mouth daily.   pantoprazole 40 MG tablet Commonly known as: PROTONIX Take 1 tablet (40 mg total) by mouth daily.   potassium chloride SA 20 MEQ tablet Commonly known as: K-DUR Take 1 tablet (20 mEq total) by mouth daily.   thiamine 100 MG tablet Take 1 tablet (100 mg total) by mouth daily.   torsemide 20 MG tablet Commonly known as: DEMADEX Take 3 tablets (60 mg total) by mouth 2 (two) times daily.      Follow-up Information    Charlott Rakes, MD Follow up in 1 week(s).   Specialty: Family Medicine Contact information: [place] [address] [phone]        [name], MD .   Specialty: Cardiology Contact  information: [address] [place] [address] [phone]          Allergies  Allergen Reactions  . Angiotensin Receptor Blockers Anaphylaxis and Other (See Comments)    (Angioedema also with Lisinopril, therefore ARB's are contraindicated)  . Lisinopril Anaphylaxis and Other (See Comments)    Throat swells  . Pamelor [Nortriptyline Hcl] Anaphylaxis and Swelling    Throat swells    Consultations: none   Procedures/Studies: Dg Chest 2 View  Result Date: 07/28/2019 CLINICAL DATA:  Chest pain. Shortness of breath. EXAM: CHEST - 2 VIEW COMPARISON:  07/17/2019 FINDINGS: The cardiac silhouette remains mildly enlarged. Mild opacity is present in both lung bases, new/increased from prior. There may be trace pleural effusions bilaterally. No pneumothorax is identified. No acute osseous abnormality is seen. IMPRESSION: Mild bibasilar opacities suggesting atelectasis. Possible trace pleural effusions. Electronically Signed   By: Logan Bores M.D.   On: 07/28/2019 16:45   Dg Chest 2 View  Result Date: 07/17/2019 CLINICAL DATA:  Shortness of breath. Lower extremity edema. Congestive heart failure. Cirrhosis. EXAM: CHEST - 2 VIEW COMPARISON:  07/16/2019 FINDINGS: Stable moderate cardiomegaly. Stable tiny left pleural effusion versus pleural thickening. No evidence of pulmonary infiltrate or edema. No significant change compared to prior study. IMPRESSION: Stable cardiomegaly and tiny left pleural effusion versus pleural thickening. No acute findings. Electronically Signed   By: Marlaine Hind M.D.   On: 07/17/2019 17:52   Dg Chest 2 View  Result Date: 07/16/2019 CLINICAL DATA:  Pt complains of central chest pain and BLE swelling x several days; reports hx of CHF,DM, HTN, and atrial flutter; smoker EXAM: CHEST - 2 VIEW COMPARISON:  20/20 20 FINDINGS: The heart is enlarged. No focal consolidations. Small LEFT pleural effusion. No pulmonary edema. IMPRESSION: 1. Cardiomegaly. 2. Small  LEFT pleural effusion. Electronically Signed   By: Nolon Nations M.D.   On: 07/16/2019 15:54   Dg Chest 2 View  Result Date: 07/11/2019 CLINICAL DATA:  Chest pain EXAM: CHEST - 2 VIEW COMPARISON:  June 19, 2019 FINDINGS: Stable cardiomegaly. No pneumothorax. Scarring or atelectasis in the left perihilar region and left base. No other interval changes. IMPRESSION: No active cardiopulmonary disease. Electronically Signed   By: Dorise Bullion III M.D   On: 07/11/2019 12:39   Ct Abdomen Pelvis W Contrast  Result Date: 07/29/2019 CLINICAL DATA:  Abdominal pain, shortness of breath. EXAM: CT ABDOMEN AND PELVIS WITH CONTRAST TECHNIQUE: Multidetector CT imaging of the abdomen  and pelvis was performed using the standard protocol following bolus administration of intravenous contrast. CONTRAST:  136m OMNIPAQUE IOHEXOL 300 MG/ML  SOLN COMPARISON:  08/16/2018 FINDINGS: Lower chest: Bilateral pleural effusions, right greater than left. Cardiomegaly. Hepatobiliary: Nodular contours of the liver compatible with cirrhosis. No focal hepatic abnormality or biliary ductal dilatation. Gallbladder grossly unremarkable. Pancreas: No focal abnormality or ductal dilatation. Spleen: No focal abnormality.  Normal size. Adrenals/Urinary Tract: No adrenal abnormality. No focal renal abnormality. No stones or hydronephrosis. Urinary bladder is unremarkable. Stomach/Bowel: Normal appendix. Stomach, large and small bowel grossly unremarkable. Scattered colonic diverticula. No active diverticulitis. Vascular/Lymphatic: Aortic atherosclerosis. No enlarged abdominal or pelvic lymph nodes. Reproductive: Mild prostate enlargement. Other: No free fluid or free air. Musculoskeletal: No acute bony abnormality. IMPRESSION: Small bilateral pleural effusions, right slightly larger than left. This is similar to prior study. Cardiomegaly. Changes of cirrhosis. Aortic atherosclerosis. Scattered diverticulosis. Prostate enlargement. No acute  findings. Electronically Signed   By: KRolm BaptiseM.D.   On: 07/29/2019 03:01      Discharge Exam: Vitals:   07/31/19 0549 07/31/19 1225  BP: 105/73 116/83  Pulse: 73 82  Resp: 16 19  Temp: 97.7 F (36.5 C) (!) 97.5 F (36.4 C)  SpO2: 97% 97%   Vitals:   07/30/19 1255 07/30/19 2123 07/31/19 0549 07/31/19 1225  BP: (!) 116/93 138/90 105/73 116/83  Pulse: 85 75 73 82  Resp: '16 16 16 19  ' Temp: 98.6 F (37 C) 98.6 F (37 C) 97.7 F (36.5 C) (!) 97.5 F (36.4 C)  TempSrc: Axillary Oral Oral Oral  SpO2: 100% 99% 97% 97%    General: Pt is alert, awake, not in acute distress Cardiovascular: RRR, S1/S2 +, no rubs, no gallops Respiratory: CTA bilaterally, no wheezing, no rhonchi Abdominal: Soft, NT, ND, bowel sounds + Extremities: no edema, no cyanosis    The results of significant diagnostics from this hospitalization (including imaging, microbiology, ancillary and laboratory) are listed below for reference.     Microbiology: Recent Results (from the past 240 hour(s))  C difficile quick scan w PCR reflex     Status: None   Collection Time: 07/29/19  3:55 AM   Specimen: STOOL  Result Value Ref Range Status   C Diff antigen NEGATIVE NEGATIVE Final   C Diff toxin NEGATIVE NEGATIVE Final   C Diff interpretation No C. difficile detected.  Final    Comment: Performed at [facility] [address] [place] [id number] Gastrointestinal Panel by PCR , Stool     Status: None   Collection Time: 07/29/19  3:55 AM   Specimen: STOOL  Result Value Ref Range Status   Campylobacter species NOT DETECTED NOT DETECTED Final   Plesimonas shigelloides NOT DETECTED NOT DETECTED Final   Salmonella species NOT DETECTED NOT DETECTED Final   Yersinia enterocolitica NOT DETECTED NOT DETECTED Final   Vibrio species NOT DETECTED NOT DETECTED Final   Vibrio cholerae NOT DETECTED NOT DETECTED Final   Enteroaggregative E coli (EAEC) NOT DETECTED NOT DETECTED Final    Enteropathogenic E coli (EPEC) NOT DETECTED NOT DETECTED Final   Enterotoxigenic E coli (ETEC) NOT DETECTED NOT DETECTED Final   Shiga like toxin producing E coli (STEC) NOT DETECTED NOT DETECTED Final   Shigella/Enteroinvasive E coli (EIEC) NOT DETECTED NOT DETECTED Final   Cryptosporidium NOT DETECTED NOT DETECTED Final   Cyclospora cayetanensis NOT DETECTED NOT DETECTED Final   Entamoeba histolytica NOT DETECTED NOT DETECTED Final   Giardia  lamblia NOT DETECTED NOT DETECTED Final   Adenovirus F40/41 NOT DETECTED NOT DETECTED Final   Astrovirus NOT DETECTED NOT DETECTED Final   Norovirus GI/GII NOT DETECTED NOT DETECTED Final   Rotavirus A NOT DETECTED NOT DETECTED Final   Sapovirus (I, II, IV, and V) NOT DETECTED NOT DETECTED Final    Comment: Performed at [facility], [place], [place] [address]  SARS CORONAVIRUS 2 Nasal Swab Aptima Multi Swab     Status: None   Collection Time: 07/29/19  7:41 AM   Specimen: Aptima Multi Swab; Nasal Swab  Result Value Ref Range Status   SARS Coronavirus 2 NEGATIVE NEGATIVE Final    Comment: (NOTE) SARS-CoV-2 target nucleic acids are NOT DETECTED. The SARS-CoV-2 RNA is generally detectable in upper and lower respiratory specimens during the acute phase of infection. Negative results do not preclude SARS-CoV-2 infection, do not rule out co-infections with other pathogens, and should not be used as the sole basis for treatment or other patient management decisions. Negative results must be combined with clinical observations, patient history, and epidemiological information. The expected result is Negative. Fact Sheet for Patients: SugarRoll.be Fact Sheet for Healthcare Providers: https://www.woods-mathews.com/ This test is not yet approved or cleared by the [place] FDA and  has been authorized for detection and/or diagnosis of SARS-CoV-2 by FDA under an Emergency Use  Authorization (EUA). This EUA will remain  in effect (meaning this test can be used) for the duration of the COVID-19 declaration under Section 56 4(b)(1) of the Act, 21 U.S.C. section 360bbb-3(b)(1), unless the authorization is terminated or revoked sooner. Performed at [facility], [place] [address] [place], [place] [address]      Labs: BNP (last 3 results) Recent Labs    07/11/19 1225 07/17/19 1823 07/29/19 1807  BNP 836.4* 1,998.2* 0,034.9*   Basic Metabolic Panel: Recent Labs  Lab 07/28/19 1607 07/28/19 1947 07/29/19 1807 07/30/19 0232 07/31/19 0319  NA 129*  --  126* 129* 132*  K 2.8*  --  3.5 3.7 3.9  CL 92*  --  91* 90* 94*  CO2 23  --  '25 28 26  ' GLUCOSE 136*  --  139* 161* 241*  BUN 11  --  18 20 25*  CREATININE 1.15  --  1.31* 1.41* 1.46*  CALCIUM 7.9*  --  7.9* 8.5* 8.2*  MG  --  1.3* 2.3 2.0 1.7   Liver Function Tests: Recent Labs  Lab 07/28/19 1947 07/31/19 0319  AST 52* 27  ALT 45* 28  ALKPHOS 89 77  BILITOT 0.8 0.5  PROT 7.5 5.8*  ALBUMIN 2.8* 2.3*   Recent Labs  Lab 07/28/19 1947  LIPASE 36   No results for input(s): AMMONIA in the last 168 hours. CBC: Recent Labs  Lab 07/28/19 1607 07/30/19 0232 07/31/19 0319  WBC 4.8 5.2 3.9*  NEUTROABS  --   --  2.7  HGB 13.8 13.9 12.6*  HCT 41.1 41.3 36.7*  MCV 93.4 92.8 93.4  PLT 242 219 187   Cardiac Enzymes: No results for input(s): CKTOTAL, CKMB, CKMBINDEX, TROPONINI in the last 168 hours. BNP: Invalid input(s): POCBNP CBG: Recent Labs  Lab 07/30/19 1251 07/30/19 1711 07/30/19 2126 07/31/19 0819 07/31/19 1222  GLUCAP 144* 129* 174* 184* 89   D-Dimer No results for input(s): DDIMER in the last 72 hours. Hgb A1c No results for input(s): HGBA1C in the last 72 hours. Lipid Profile No results for input(s): CHOL, HDL, LDLCALC, TRIG, CHOLHDL,  LDLDIRECT in the last 72 hours. Thyroid function studies No results for input(s): TSH, T4TOTAL, T3FREE, THYROIDAB in the last 72  hours.  Invalid input(s): FREET3 Anemia work up No results for input(s): VITAMINB12, FOLATE, FERRITIN, TIBC, IRON, RETICCTPCT in the last 72 hours. Urinalysis    Component Value Date/Time   COLORURINE YELLOW 07/29/2019 1421   APPEARANCEUR CLEAR 07/29/2019 1421   LABSPEC 1.033 (H) 07/29/2019 1421   PHURINE 6.0 07/29/2019 1421   GLUCOSEU NEGATIVE 07/29/2019 1421   HGBUR SMALL (A) 07/29/2019 1421   BILIRUBINUR NEGATIVE 07/29/2019 1421   BILIRUBINUR neg 05/08/2017 1503   KETONESUR NEGATIVE 07/29/2019 1421   PROTEINUR 100 (A) 07/29/2019 1421   UROBILINOGEN 0.2 05/08/2017 1503   UROBILINOGEN 1.0 04/26/2012 1328   NITRITE NEGATIVE 07/29/2019 1421   LEUKOCYTESUR NEGATIVE 07/29/2019 1421   Sepsis Labs Invalid input(s): PROCALCITONIN,  WBC,  LACTICIDVEN Microbiology Recent Results (from the past 240 hour(s))  C difficile quick scan w PCR reflex     Status: None   Collection Time: 07/29/19  3:55 AM   Specimen: STOOL  Result Value Ref Range Status   C Diff antigen NEGATIVE NEGATIVE Final   C Diff toxin NEGATIVE NEGATIVE Final   C Diff interpretation No C. difficile detected.  Final    Comment: Performed at [facility], [place] [address], [place], [place] [address]  Gastrointestinal Panel by PCR , Stool     Status: None   Collection Time: 07/29/19  3:55 AM   Specimen: STOOL  Result Value Ref Range Status   Campylobacter species NOT DETECTED NOT DETECTED Final   Plesimonas shigelloides NOT DETECTED NOT DETECTED Final   Salmonella species NOT DETECTED NOT DETECTED Final   Yersinia enterocolitica NOT DETECTED NOT DETECTED Final   Vibrio species NOT DETECTED NOT DETECTED Final   Vibrio cholerae NOT DETECTED NOT DETECTED Final   Enteroaggregative E coli (EAEC) NOT DETECTED NOT DETECTED Final   Enteropathogenic E coli (EPEC) NOT DETECTED NOT DETECTED Final   Enterotoxigenic E coli (ETEC) NOT DETECTED NOT DETECTED Final   Shiga like toxin producing E coli (STEC) NOT DETECTED NOT  DETECTED Final   Shigella/Enteroinvasive E coli (EIEC) NOT DETECTED NOT DETECTED Final   Cryptosporidium NOT DETECTED NOT DETECTED Final   Cyclospora cayetanensis NOT DETECTED NOT DETECTED Final   Entamoeba histolytica NOT DETECTED NOT DETECTED Final   Giardia lamblia NOT DETECTED NOT DETECTED Final   Adenovirus F40/41 NOT DETECTED NOT DETECTED Final   Astrovirus NOT DETECTED NOT DETECTED Final   Norovirus GI/GII NOT DETECTED NOT DETECTED Final   Rotavirus A NOT DETECTED NOT DETECTED Final   Sapovirus (I, II, IV, and V) NOT DETECTED NOT DETECTED Final    Comment: Performed at [facility], [place] [place], [place] [address]  SARS CORONAVIRUS 2 Nasal Swab Aptima Multi Swab     Status: None   Collection Time: 07/29/19  7:41 AM   Specimen: Aptima Multi Swab; Nasal Swab  Result Value Ref Range Status   SARS Coronavirus 2 NEGATIVE NEGATIVE Final    Comment: (NOTE) SARS-CoV-2 target nucleic acids are NOT DETECTED. The SARS-CoV-2 RNA is generally detectable in upper and lower respiratory specimens during the acute phase of infection. Negative results do not preclude SARS-CoV-2 infection, do not rule out co-infections with other pathogens, and should not be used as the sole basis for treatment or other patient management decisions. Negative results must be combined with clinical observations, patient history, and epidemiological information. The expected result is  Negative. Fact Sheet for Patients: SugarRoll.be Fact Sheet for Healthcare Providers: https://www.woods-mathews.com/ This test is not yet approved or cleared by the [place] FDA and  has been authorized for detection and/or diagnosis of SARS-CoV-2 by FDA under an Emergency Use Authorization (EUA). This EUA will remain  in effect (meaning this test can be used) for the duration of the COVID-19 declaration under Section 56 4(b)(1) of the Act, 21 U.S.C. section  360bbb-3(b)(1), unless the authorization is terminated or revoked sooner. Performed at [facility], [place] [address], [place],  [address]      Time coordinating discharge: Over 30 minutes  SIGNED:   Darliss Cheney, MD  [facility] 07/31/2019, 12:53 PM Pager [phone]  If 7PM-7AM, please contact night-coverage [email or web address] Password [email or web address]

## 2019-07-31 NOTE — Progress Notes (Signed)
Nsg Discharge Note  Admit Date:  07/28/2019 Discharge date: 07/31/2019   [name] to be D/C'd Home per MD order.  AVS completed.  Copy for chart, and copy for patient signed, and dated. Patient/caregiver able to verbalize understanding.  Discharge Medication: Allergies as of 07/31/2019      Reactions   Angiotensin Receptor Blockers Anaphylaxis, Other (See Comments)   (Angioedema also with Lisinopril, therefore ARB's are contraindicated)   Lisinopril Anaphylaxis, Other (See Comments)   Throat swells   Pamelor [nortriptyline Hcl] Anaphylaxis, Swelling   Throat swells      Medication List    TAKE these medications   Accu-Chek FastClix Lancets Misc Use as directed three times daily   Accu-Chek Guide w/Device Kit 1 each by Does not apply route 3 (three) times daily.   albuterol 108 (90 Base) MCG/ACT inhaler Commonly known as: VENTOLIN HFA Inhale 2 puffs into the lungs every 6 (six) hours as needed for wheezing or shortness of breath.   apixaban 5 MG Tabs tablet Commonly known as: Eliquis Take 1 tablet (5 mg total) by mouth 2 (two) times daily.   atorvastatin 40 MG tablet Commonly known as: LIPITOR Take 1 tablet (40 mg total) by mouth daily.   dicyclomine 10 MG capsule Commonly known as: Bentyl Take 1 capsule (10 mg total) by mouth 3 (three) times daily as needed for up to 14 days for spasms (abdominal pain/cramps).   diltiazem 30 MG tablet Commonly known as: CARDIZEM Take 1 tablet (30 mg total) by mouth 2 (two) times daily.   DULoxetine 60 MG capsule Commonly known as: Cymbalta Take 1 capsule (60 mg total) by mouth daily.   Easy Comfort Pen Needles 31G X 8 MM Misc Generic drug: Insulin Pen Needle USE AS DIRECTED TWICE A DAY   ferrous sulfate 325 (65 FE) MG tablet Take 1 tablet (325 mg total) by mouth daily with breakfast.   folic acid 1 MG tablet Commonly known as: FOLVITE Take 6 tablets (6 mg total) by mouth daily.   gabapentin 300 MG capsule Commonly  known as: NEURONTIN Take 2 capsules (600 mg total) by mouth 2 (two) times daily.   glucose blood test strip Commonly known as: Accu-Chek Guide Use as directed three times daily   Lantus SoloStar 100 UNIT/ML Solostar Pen Generic drug: Insulin Glargine Inject 8-14 Units into the skin daily. Start with 8 units and titrate per home BG checks (if >200 persistently) as discussed at discharge   Magnesium Oxide 400 MG Caps Take 1 capsule (400 mg total) by mouth 2 (two) times a day.   methocarbamol 500 MG tablet Commonly known as: ROBAXIN Take 1 tablet (500 mg total) by mouth 2 (two) times daily.   mirtazapine 15 MG tablet Commonly known as: Remeron Take 1 tablet (15 mg total) by mouth at bedtime.   multivitamin with minerals Tabs tablet Take 1 tablet by mouth daily.   pantoprazole 40 MG tablet Commonly known as: PROTONIX Take 1 tablet (40 mg total) by mouth daily.   potassium chloride SA 20 MEQ tablet Commonly known as: K-DUR Take 1 tablet (20 mEq total) by mouth daily.   thiamine 100 MG tablet Take 1 tablet (100 mg total) by mouth daily.   torsemide 20 MG tablet Commonly known as: DEMADEX Take 3 tablets (60 mg total) by mouth 2 (two) times daily.       Discharge Assessment: Vitals:   07/31/19 0549 07/31/19 1225  BP: 105/73 116/83  Pulse: 73 82  Resp: 16  19  Temp: 97.7 F (36.5 C) (!) 97.5 F (36.4 C)  SpO2: 97% 97%   Skin clean, dry and intact without evidence of skin break down, no evidence of skin tears noted. IV catheter discontinued intact. Site without signs and symptoms of complications - no redness or edema noted at insertion site, patient denies c/o pain - only slight tenderness at site.  Dressing with slight pressure applied.  D/c Instructions-Education: Discharge instructions given to patient/family with verbalized understanding. D/c education completed with patient/family including follow up instructions, medication list, d/c activities limitations if  indicated, with other d/c instructions as indicated by MD - patient able to verbalize understanding, all questions fully answered. Patient instructed to return to ED, call 911, or call MD for any changes in condition.  Patient escorted via [place], and D/C home via bus stop  Tresa Endo, RN 07/31/2019 3:11 PM

## 2019-07-31 NOTE — TOC Progression Note (Signed)
Transition of Care [facility]) - Progression Note    Patient Details  Name: Brad [name] MRN: [id number] Date of Birth: 1961-03-19  Transition of Care [facility]) CM/SW [place], [place] Phone Number: 07/31/2019, 11:51 AM  Clinical Narrative:     CSW met with patient at bedside.  Patient explained to CSW that he did not have a place to go after discharge. Patient has a place at [facility] and has paid his deposit.  Waiting on orientation.  CSW called [facility] for available beds.  No beds available.  Resources for shelters and food were given to patient.        Expected Discharge Plan and Services                                                 Social Determinants of Health (SDOH) Interventions    Readmission Risk Interventions Readmission Risk Prevention Plan 07/20/2019 05/28/2019  Transportation Screening Complete Complete  Medication Review Press photographer) Complete Complete  PCP or Specialist appointment within 3-5 days of discharge Complete Complete  HRI or [place] Not Complete Complete  HRI or Home Care Consult Pt Refusal Comments pt is homeless -  SW Recovery Care/Counseling Consult Complete Complete  Palliative Care Screening Not Applicable Not [place] Not Applicable Not Applicable  Some recent data might be hidden

## 2019-08-01 ENCOUNTER — Inpatient Hospital Stay (HOSPITAL_COMMUNITY)
Admission: EM | Admit: 2019-08-01 | Discharge: 2019-08-04 | DRG: 291 | Disposition: A | Discharge status: Home / Self Care | Payer: Medicaid Other | Attending: Internal Medicine | Admitting: Internal Medicine

## 2019-08-01 ENCOUNTER — Ambulatory Visit: Payer: Medicaid Other | Admitting: Family Medicine

## 2019-08-01 ENCOUNTER — Telehealth: Payer: Self-pay

## 2019-08-01 ENCOUNTER — Emergency Department (HOSPITAL_COMMUNITY): Payer: Medicaid Other

## 2019-08-01 ENCOUNTER — Encounter (HOSPITAL_COMMUNITY): Payer: Self-pay | Admitting: *Deleted

## 2019-08-01 ENCOUNTER — Other Ambulatory Visit: Payer: Self-pay

## 2019-08-01 ENCOUNTER — Encounter (HOSPITAL_COMMUNITY): Payer: Self-pay | Admitting: Emergency Medicine

## 2019-08-01 ENCOUNTER — Encounter (HOSPITAL_COMMUNITY): Payer: Self-pay | Admitting: Family Medicine

## 2019-08-01 DIAGNOSIS — R7989 Other specified abnormal findings of blood chemistry: Secondary | ICD-10-CM

## 2019-08-01 DIAGNOSIS — Z79899 Other long term (current) drug therapy: Secondary | ICD-10-CM

## 2019-08-01 DIAGNOSIS — I2510 Atherosclerotic heart disease of native coronary artery without angina pectoris: Secondary | ICD-10-CM | POA: Diagnosis present

## 2019-08-01 DIAGNOSIS — I5043 Acute on chronic combined systolic (congestive) and diastolic (congestive) heart failure: Secondary | ICD-10-CM | POA: Diagnosis not present

## 2019-08-01 DIAGNOSIS — I472 Ventricular tachycardia: Secondary | ICD-10-CM | POA: Diagnosis not present

## 2019-08-01 DIAGNOSIS — K219 Gastro-esophageal reflux disease without esophagitis: Secondary | ICD-10-CM | POA: Diagnosis present

## 2019-08-01 DIAGNOSIS — E876 Hypokalemia: Secondary | ICD-10-CM | POA: Diagnosis present

## 2019-08-01 DIAGNOSIS — K7460 Unspecified cirrhosis of liver: Secondary | ICD-10-CM | POA: Diagnosis present

## 2019-08-01 DIAGNOSIS — Z888 Allergy status to other drugs, medicaments and biological substances status: Secondary | ICD-10-CM

## 2019-08-01 DIAGNOSIS — I428 Other cardiomyopathies: Secondary | ICD-10-CM | POA: Diagnosis present

## 2019-08-01 DIAGNOSIS — I4892 Unspecified atrial flutter: Secondary | ICD-10-CM | POA: Diagnosis present

## 2019-08-01 DIAGNOSIS — F17210 Nicotine dependence, cigarettes, uncomplicated: Secondary | ICD-10-CM | POA: Diagnosis present

## 2019-08-01 DIAGNOSIS — F1410 Cocaine abuse, uncomplicated: Secondary | ICD-10-CM

## 2019-08-01 DIAGNOSIS — Z7901 Long term (current) use of anticoagulants: Secondary | ICD-10-CM

## 2019-08-01 DIAGNOSIS — N183 Chronic kidney disease, stage 3 unspecified: Secondary | ICD-10-CM | POA: Diagnosis present

## 2019-08-01 DIAGNOSIS — I493 Ventricular premature depolarization: Secondary | ICD-10-CM | POA: Diagnosis present

## 2019-08-01 DIAGNOSIS — R079 Chest pain, unspecified: Secondary | ICD-10-CM | POA: Diagnosis not present

## 2019-08-01 DIAGNOSIS — B182 Chronic viral hepatitis C: Secondary | ICD-10-CM

## 2019-08-01 DIAGNOSIS — I509 Heart failure, unspecified: Secondary | ICD-10-CM

## 2019-08-01 DIAGNOSIS — R778 Other specified abnormalities of plasma proteins: Secondary | ICD-10-CM | POA: Diagnosis present

## 2019-08-01 DIAGNOSIS — F1010 Alcohol abuse, uncomplicated: Secondary | ICD-10-CM

## 2019-08-01 DIAGNOSIS — Z96652 Presence of left artificial knee joint: Secondary | ICD-10-CM | POA: Diagnosis present

## 2019-08-01 DIAGNOSIS — F1020 Alcohol dependence, uncomplicated: Secondary | ICD-10-CM | POA: Diagnosis present

## 2019-08-01 DIAGNOSIS — R0602 Shortness of breath: Secondary | ICD-10-CM

## 2019-08-01 DIAGNOSIS — E119 Type 2 diabetes mellitus without complications: Secondary | ICD-10-CM

## 2019-08-01 DIAGNOSIS — Z9119 Patient's noncompliance with other medical treatment and regimen: Secondary | ICD-10-CM

## 2019-08-01 DIAGNOSIS — Z8546 Personal history of malignant neoplasm of prostate: Secondary | ICD-10-CM

## 2019-08-01 DIAGNOSIS — I5021 Acute systolic (congestive) heart failure: Secondary | ICD-10-CM | POA: Diagnosis present

## 2019-08-01 DIAGNOSIS — E1122 Type 2 diabetes mellitus with diabetic chronic kidney disease: Secondary | ICD-10-CM | POA: Diagnosis present

## 2019-08-01 DIAGNOSIS — I130 Hypertensive heart and chronic kidney disease with heart failure and stage 1 through stage 4 chronic kidney disease, or unspecified chronic kidney disease: Principal | ICD-10-CM | POA: Diagnosis present

## 2019-08-01 DIAGNOSIS — Z9114 Patient's other noncompliance with medication regimen: Secondary | ICD-10-CM

## 2019-08-01 DIAGNOSIS — I440 Atrioventricular block, first degree: Secondary | ICD-10-CM | POA: Diagnosis present

## 2019-08-01 DIAGNOSIS — I4821 Permanent atrial fibrillation: Secondary | ICD-10-CM | POA: Diagnosis present

## 2019-08-01 DIAGNOSIS — Z794 Long term (current) use of insulin: Secondary | ICD-10-CM

## 2019-08-01 DIAGNOSIS — Z8673 Personal history of transient ischemic attack (TIA), and cerebral infarction without residual deficits: Secondary | ICD-10-CM

## 2019-08-01 DIAGNOSIS — E785 Hyperlipidemia, unspecified: Secondary | ICD-10-CM | POA: Diagnosis present

## 2019-08-01 DIAGNOSIS — Z8249 Family history of ischemic heart disease and other diseases of the circulatory system: Secondary | ICD-10-CM

## 2019-08-01 DIAGNOSIS — E7800 Pure hypercholesterolemia, unspecified: Secondary | ICD-10-CM | POA: Diagnosis present

## 2019-08-01 DIAGNOSIS — G8929 Other chronic pain: Secondary | ICD-10-CM | POA: Diagnosis present

## 2019-08-01 DIAGNOSIS — Z20828 Contact with and (suspected) exposure to other viral communicable diseases: Secondary | ICD-10-CM | POA: Diagnosis present

## 2019-08-01 DIAGNOSIS — E1140 Type 2 diabetes mellitus with diabetic neuropathy, unspecified: Secondary | ICD-10-CM | POA: Diagnosis present

## 2019-08-01 DIAGNOSIS — F329 Major depressive disorder, single episode, unspecified: Secondary | ICD-10-CM | POA: Diagnosis present

## 2019-08-01 DIAGNOSIS — I5023 Acute on chronic systolic (congestive) heart failure: Secondary | ICD-10-CM

## 2019-08-01 DIAGNOSIS — R0682 Tachypnea, not elsewhere classified: Secondary | ICD-10-CM

## 2019-08-01 DIAGNOSIS — E871 Hypo-osmolality and hyponatremia: Secondary | ICD-10-CM | POA: Diagnosis present

## 2019-08-01 LAB — CBC
HCT: 43 % (ref 39.0–52.0)
Hemoglobin: 14.5 g/dL (ref 13.0–17.0)
MCH: 31.9 pg (ref 26.0–34.0)
MCHC: 33.7 g/dL (ref 30.0–36.0)
MCV: 94.7 fL (ref 80.0–100.0)
Platelets: 257 10*3/uL (ref 150–400)
RBC: 4.54 MIL/uL (ref 4.22–5.81)
RDW: 13.6 % (ref 11.5–15.5)
WBC: 4.8 10*3/uL (ref 4.0–10.5)
nRBC: 0 % (ref 0.0–0.2)

## 2019-08-01 LAB — BASIC METABOLIC PANEL
Anion gap: 11 (ref 5–15)
BUN: 23 mg/dL — ABNORMAL HIGH (ref 6–20)
CO2: 29 mmol/L (ref 22–32)
Calcium: 8.7 mg/dL — ABNORMAL LOW (ref 8.9–10.3)
Chloride: 94 mmol/L — ABNORMAL LOW (ref 98–111)
Creatinine, Ser: 1.55 mg/dL — ABNORMAL HIGH (ref 0.61–1.24)
GFR calc Af Amer: 56 mL/min — ABNORMAL LOW (ref >60)
GFR calc non Af Amer: 48 mL/min — ABNORMAL LOW (ref >60)
Glucose, Bld: 161 mg/dL — ABNORMAL HIGH (ref 70–99)
Potassium: 4 mmol/L (ref 3.5–5.1)
Sodium: 134 mmol/L — ABNORMAL LOW (ref 135–145)

## 2019-08-01 LAB — RAPID URINE DRUG SCREEN, HOSP PERFORMED
Amphetamines: NOT DETECTED
Barbiturates: NOT DETECTED
Benzodiazepines: NOT DETECTED
Cocaine: NOT DETECTED
Opiates: POSITIVE — AB
Tetrahydrocannabinol: NOT DETECTED

## 2019-08-01 LAB — BRAIN NATRIURETIC PEPTIDE: B Natriuretic Peptide: 1008.8 pg/mL — ABNORMAL HIGH (ref 0.0–100.0)

## 2019-08-01 LAB — GLUCOSE, CAPILLARY: Glucose-Capillary: 181 mg/dL — ABNORMAL HIGH (ref 70–99)

## 2019-08-01 LAB — TROPONIN I (HIGH SENSITIVITY)
Troponin I (High Sensitivity): 25 ng/L — ABNORMAL HIGH (ref <18)
Troponin I (High Sensitivity): 24 ng/L — ABNORMAL HIGH (ref <18)

## 2019-08-01 MED ORDER — ASPIRIN 81 MG PO CHEW
324.0000 mg | CHEWABLE_TABLET | Freq: Once | ORAL | Status: AC
  Administered 2019-08-01: 324 mg via ORAL
  Filled 2019-08-01: qty 4

## 2019-08-01 MED ORDER — ACETAMINOPHEN 325 MG PO TABS
650.0000 mg | ORAL_TABLET | ORAL | Status: DC | PRN
  Administered 2019-08-03 – 2019-08-04 (×2): 650 mg via ORAL
  Filled 2019-08-01 (×2): qty 2

## 2019-08-01 MED ORDER — LORAZEPAM 1 MG PO TABS
1.0000 mg | ORAL_TABLET | Freq: Four times a day (QID) | ORAL | Status: DC | PRN
  Filled 2019-08-01: qty 1

## 2019-08-01 MED ORDER — INSULIN ASPART 100 UNIT/ML ~~LOC~~ SOLN: 0.0000 [IU] | Freq: Every day | SUBCUTANEOUS | Status: DC

## 2019-08-01 MED ORDER — ATORVASTATIN CALCIUM 40 MG PO TABS
40.0000 mg | ORAL_TABLET | Freq: Every day | ORAL | Status: DC
  Administered 2019-08-01 – 2019-08-03 (×3): 40 mg via ORAL
  Filled 2019-08-01 (×3): qty 1

## 2019-08-01 MED ORDER — LORAZEPAM 2 MG/ML IJ SOLN
1.0000 mg | Freq: Four times a day (QID) | INTRAMUSCULAR | Status: DC | PRN
  Administered 2019-08-02: 1 mg via INTRAVENOUS
  Filled 2019-08-01: qty 1

## 2019-08-01 MED ORDER — SODIUM CHLORIDE 0.9% FLUSH
3.0000 mL | Freq: Two times a day (BID) | INTRAVENOUS | Status: DC
  Administered 2019-08-01 – 2019-08-03 (×4): 3 mL via INTRAVENOUS

## 2019-08-01 MED ORDER — THIAMINE HCL 100 MG/ML IJ SOLN: 100.0000 mg | Freq: Every day | INTRAMUSCULAR | Status: DC

## 2019-08-01 MED ORDER — FOLIC ACID 1 MG PO TABS
1.0000 mg | ORAL_TABLET | Freq: Every day | ORAL | Status: DC
  Administered 2019-08-01 – 2019-08-04 (×4): 1 mg via ORAL
  Filled 2019-08-01 (×4): qty 1

## 2019-08-01 MED ORDER — VITAMIN B-1 100 MG PO TABS
100.0000 mg | ORAL_TABLET | Freq: Every day | ORAL | Status: DC
  Administered 2019-08-01 – 2019-08-04 (×4): 100 mg via ORAL
  Filled 2019-08-01 (×4): qty 1

## 2019-08-01 MED ORDER — ONDANSETRON HCL 4 MG/2ML IJ SOLN: 4.0000 mg | Freq: Four times a day (QID) | INTRAMUSCULAR | Status: DC | PRN

## 2019-08-01 MED ORDER — NITROGLYCERIN 0.4 MG SL SUBL
0.4000 mg | SUBLINGUAL_TABLET | SUBLINGUAL | Status: DC | PRN
  Administered 2019-08-02: 12:00:00 0.4 mg via SUBLINGUAL
  Filled 2019-08-01: qty 1

## 2019-08-01 MED ORDER — PANTOPRAZOLE SODIUM 40 MG PO TBEC
40.0000 mg | DELAYED_RELEASE_TABLET | Freq: Every day | ORAL | Status: DC
  Administered 2019-08-01 – 2019-08-04 (×4): 40 mg via ORAL
  Filled 2019-08-01 (×4): qty 1

## 2019-08-01 MED ORDER — FUROSEMIDE 10 MG/ML IJ SOLN
40.0000 mg | Freq: Once | INTRAMUSCULAR | Status: AC
  Administered 2019-08-01: 40 mg via INTRAVENOUS
  Filled 2019-08-01: qty 4

## 2019-08-01 MED ORDER — INSULIN ASPART 100 UNIT/ML ~~LOC~~ SOLN
0.0000 [IU] | Freq: Three times a day (TID) | SUBCUTANEOUS | Status: DC
  Administered 2019-08-02 (×2): 2 [IU] via SUBCUTANEOUS
  Administered 2019-08-02: 1 [IU] via SUBCUTANEOUS
  Administered 2019-08-03: 3 [IU] via SUBCUTANEOUS
  Administered 2019-08-03: 2 [IU] via SUBCUTANEOUS
  Administered 2019-08-03 – 2019-08-04 (×2): 1 [IU] via SUBCUTANEOUS

## 2019-08-01 MED ORDER — FUROSEMIDE 10 MG/ML IJ SOLN
40.0000 mg | Freq: Two times a day (BID) | INTRAMUSCULAR | Status: DC
  Administered 2019-08-01 – 2019-08-03 (×4): 40 mg via INTRAVENOUS
  Filled 2019-08-01 (×4): qty 4

## 2019-08-01 MED ORDER — ASPIRIN EC 81 MG PO TBEC
81.0000 mg | DELAYED_RELEASE_TABLET | Freq: Every day | ORAL | Status: DC
  Administered 2019-08-02 – 2019-08-04 (×3): 81 mg via ORAL
  Filled 2019-08-01 (×3): qty 1

## 2019-08-01 MED ORDER — ADULT MULTIVITAMIN W/MINERALS CH
1.0000 | ORAL_TABLET | Freq: Every day | ORAL | Status: DC
  Administered 2019-08-01 – 2019-08-04 (×4): 1 via ORAL
  Filled 2019-08-01 (×4): qty 1

## 2019-08-01 MED ORDER — SODIUM CHLORIDE 0.9 % IV SOLN: 250.0000 mL | INTRAVENOUS | Status: DC | PRN

## 2019-08-01 MED ORDER — DILTIAZEM HCL 30 MG PO TABS
30.0000 mg | ORAL_TABLET | Freq: Two times a day (BID) | ORAL | Status: DC
  Administered 2019-08-01 – 2019-08-04 (×6): 30 mg via ORAL
  Filled 2019-08-01 (×6): qty 1

## 2019-08-01 MED ORDER — APIXABAN 5 MG PO TABS
5.0000 mg | ORAL_TABLET | Freq: Two times a day (BID) | ORAL | Status: DC
  Administered 2019-08-01 – 2019-08-04 (×6): 5 mg via ORAL
  Filled 2019-08-01 (×6): qty 1

## 2019-08-01 MED ORDER — GABAPENTIN 300 MG PO CAPS
600.0000 mg | ORAL_CAPSULE | Freq: Two times a day (BID) | ORAL | Status: DC
  Administered 2019-08-01 – 2019-08-04 (×6): 600 mg via ORAL
  Filled 2019-08-01 (×6): qty 2

## 2019-08-01 MED ORDER — TRAMADOL HCL 50 MG PO TABS
50.0000 mg | ORAL_TABLET | Freq: Once | ORAL | Status: AC
  Administered 2019-08-01: 50 mg via ORAL
  Filled 2019-08-01: qty 1

## 2019-08-01 MED ORDER — SODIUM CHLORIDE 0.9% FLUSH: 3.0000 mL | INTRAVENOUS | Status: DC | PRN

## 2019-08-01 NOTE — Plan of Care (Signed)
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
## 2019-08-01 NOTE — Telephone Encounter (Signed)
Transition Care Management Follow-up Telephone Call  Date of discharge and from where: 07/31/2019, [facility]   How have you been since you were released from the hospital? He said that he still doesn't feel great. He did not report any chest pain or diarrhea.   Any questions or concerns? Lack of housing is his main concern - please see below. .   Items Reviewed:  Did the pt receive and understand the discharge instructions provided? Yes, but he did not have them with him at the time of the call.   Medications obtained and verified? He said that he has all medications except torsemide and he was going to [employer] today to see if he is eligible for a refill and also to get test strips for the glucometer. He said that he is taking medications as ordered including the insulin. He also noted that he has his glucometer.  He said that he has the medication instructions but did not have them with him at the time of the call.  They were in his friend's  truck and his friend was at work. No new/changed medications at discharge.   Any new allergies since your discharge?  None reported   Do you have support at home? Currently homeless.  Was at [place] when this CM called. He said that he and his friend stayed at a church last night but they can't stay there again. He said that [name] started a job today and was not with him.  He said that he still has not received the key to his mother's house that his sister sent to him. He stated  that he tried to contact his sister yesterday but was not successful.  He has been checking mail /UPS delivery at the [facility] and they have informed him that they have not received the package/envelope from his sister. He said that his sister sent him tracking information but he did not have access to that information. Informed him that this CM will try to check the status of the delivery if he can get the tracking info to this CM . Encouraged him to go to the [employer] if he is having difficulty accessig his email. He also said that he is #50 on the waiting list for [facility] and he still needs to attend the orientation.   Other (ie: DME, Home Health, etc) he receives a weekly community paramedicine visit from [name], EMT.   Functional Questionnaire: (I = Independent and D = Dependent) ADL's: independent, has cane to use when needed.    Follow up appointments reviewed:    PCP Hospital f/u appt confirmed? Appointment cancelled to today. Rescheduled to 08/08/2019 @ [facility] f/u appt confirmed? Cardiology appointment tomorrow - 08/02/2019.   Are transportation arrangements needed? He will need to use the bus or his friend, [name] can provide transportation.   If their condition worsens, is the pt aware to call  their PCP or go to the ED?yes  Was the patient provided with contact information for the PCP's office or ED? He has the phone number for the clinic,   Was the pt encouraged to call back with questions or concerns?yes
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
## 2019-08-01 NOTE — ED Notes (Signed)
Pt Sp02 94 RA while ambulating
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
## 2019-08-01 NOTE — Progress Notes (Signed)
Patient complaining of pain on his chest stated " feels like something is sitting on it" . Patient was offered Nitroglycerin (NITROSTAT) for chest pain but the patient refused and said " it doesn't work they give me that at [facility] . Paged on call provider awaiting for orders.

## 2019-08-01 NOTE — ED Notes (Signed)
Rept called to [facility]

## 2019-08-01 NOTE — ED Triage Notes (Signed)
[facility] [place] and swelling in arms and legs. Pt out of Furosemide.  Vitals: 160/100, 106HR, 20R, 98% on RA, CBG 169, 97.7 temp.  Pt was seen at [facility] ED yesterday.

## 2019-08-02 ENCOUNTER — Inpatient Hospital Stay (HOSPITAL_COMMUNITY): Payer: Medicaid Other

## 2019-08-02 ENCOUNTER — Encounter (HOSPITAL_COMMUNITY): Payer: Medicaid Other

## 2019-08-02 DIAGNOSIS — Z9119 Patient's noncompliance with other medical treatment and regimen: Secondary | ICD-10-CM | POA: Diagnosis not present

## 2019-08-02 DIAGNOSIS — E1140 Type 2 diabetes mellitus with diabetic neuropathy, unspecified: Secondary | ICD-10-CM | POA: Diagnosis present

## 2019-08-02 DIAGNOSIS — I130 Hypertensive heart and chronic kidney disease with heart failure and stage 1 through stage 4 chronic kidney disease, or unspecified chronic kidney disease: Secondary | ICD-10-CM | POA: Diagnosis present

## 2019-08-02 DIAGNOSIS — E871 Hypo-osmolality and hyponatremia: Secondary | ICD-10-CM | POA: Diagnosis present

## 2019-08-02 DIAGNOSIS — I428 Other cardiomyopathies: Secondary | ICD-10-CM | POA: Diagnosis present

## 2019-08-02 DIAGNOSIS — Z7901 Long term (current) use of anticoagulants: Secondary | ICD-10-CM | POA: Diagnosis not present

## 2019-08-02 DIAGNOSIS — E876 Hypokalemia: Secondary | ICD-10-CM | POA: Diagnosis present

## 2019-08-02 DIAGNOSIS — I5043 Acute on chronic combined systolic (congestive) and diastolic (congestive) heart failure: Secondary | ICD-10-CM | POA: Diagnosis present

## 2019-08-02 DIAGNOSIS — Z888 Allergy status to other drugs, medicaments and biological substances status: Secondary | ICD-10-CM | POA: Diagnosis not present

## 2019-08-02 DIAGNOSIS — K219 Gastro-esophageal reflux disease without esophagitis: Secondary | ICD-10-CM | POA: Diagnosis present

## 2019-08-02 DIAGNOSIS — E1122 Type 2 diabetes mellitus with diabetic chronic kidney disease: Secondary | ICD-10-CM | POA: Diagnosis present

## 2019-08-02 DIAGNOSIS — G8929 Other chronic pain: Secondary | ICD-10-CM | POA: Diagnosis present

## 2019-08-02 DIAGNOSIS — I4892 Unspecified atrial flutter: Secondary | ICD-10-CM | POA: Diagnosis present

## 2019-08-02 DIAGNOSIS — Z8249 Family history of ischemic heart disease and other diseases of the circulatory system: Secondary | ICD-10-CM | POA: Diagnosis not present

## 2019-08-02 DIAGNOSIS — I5021 Acute systolic (congestive) heart failure: Secondary | ICD-10-CM | POA: Diagnosis present

## 2019-08-02 DIAGNOSIS — F17210 Nicotine dependence, cigarettes, uncomplicated: Secondary | ICD-10-CM | POA: Diagnosis present

## 2019-08-02 DIAGNOSIS — I509 Heart failure, unspecified: Secondary | ICD-10-CM | POA: Diagnosis present

## 2019-08-02 DIAGNOSIS — B182 Chronic viral hepatitis C: Secondary | ICD-10-CM | POA: Diagnosis present

## 2019-08-02 DIAGNOSIS — I472 Ventricular tachycardia: Secondary | ICD-10-CM | POA: Diagnosis not present

## 2019-08-02 DIAGNOSIS — F1410 Cocaine abuse, uncomplicated: Secondary | ICD-10-CM | POA: Diagnosis present

## 2019-08-02 DIAGNOSIS — Z20828 Contact with and (suspected) exposure to other viral communicable diseases: Secondary | ICD-10-CM | POA: Diagnosis present

## 2019-08-02 DIAGNOSIS — K7460 Unspecified cirrhosis of liver: Secondary | ICD-10-CM | POA: Diagnosis present

## 2019-08-02 DIAGNOSIS — N183 Chronic kidney disease, stage 3 (moderate): Secondary | ICD-10-CM | POA: Diagnosis present

## 2019-08-02 DIAGNOSIS — I4821 Permanent atrial fibrillation: Secondary | ICD-10-CM | POA: Diagnosis present

## 2019-08-02 DIAGNOSIS — F1010 Alcohol abuse, uncomplicated: Secondary | ICD-10-CM | POA: Diagnosis present

## 2019-08-02 DIAGNOSIS — E7800 Pure hypercholesterolemia, unspecified: Secondary | ICD-10-CM | POA: Diagnosis present

## 2019-08-02 LAB — BASIC METABOLIC PANEL
Anion gap: 10 (ref 5–15)
BUN: 23 mg/dL — ABNORMAL HIGH (ref 6–20)
CO2: 28 mmol/L (ref 22–32)
Calcium: 8.2 mg/dL — ABNORMAL LOW (ref 8.9–10.3)
Chloride: 99 mmol/L (ref 98–111)
Creatinine, Ser: 1.32 mg/dL — ABNORMAL HIGH (ref 0.61–1.24)
GFR calc Af Amer: >60 mL/min (ref >60)
GFR calc non Af Amer: 59 mL/min — ABNORMAL LOW (ref >60)
Glucose, Bld: 148 mg/dL — ABNORMAL HIGH (ref 70–99)
Potassium: 3.1 mmol/L — ABNORMAL LOW (ref 3.5–5.1)
Sodium: 137 mmol/L (ref 135–145)

## 2019-08-02 LAB — GLUCOSE, CAPILLARY
Glucose-Capillary: 146 mg/dL — ABNORMAL HIGH (ref 70–99)
Glucose-Capillary: 152 mg/dL — ABNORMAL HIGH (ref 70–99)
Glucose-Capillary: 159 mg/dL — ABNORMAL HIGH (ref 70–99)
Glucose-Capillary: 176 mg/dL — ABNORMAL HIGH (ref 70–99)

## 2019-08-02 MED ORDER — POTASSIUM CHLORIDE CRYS ER 20 MEQ PO TBCR
40.0000 meq | EXTENDED_RELEASE_TABLET | Freq: Two times a day (BID) | ORAL | Status: AC
  Administered 2019-08-02 – 2019-08-03 (×4): 40 meq via ORAL
  Filled 2019-08-02 (×4): qty 2

## 2019-08-02 MED ORDER — TRAMADOL HCL 50 MG PO TABS
50.0000 mg | ORAL_TABLET | Freq: Once | ORAL | Status: AC
  Administered 2019-08-02: 50 mg via ORAL
  Filled 2019-08-02: qty 1

## 2019-08-02 NOTE — Evaluation (Signed)
Physical Therapy Evaluation Patient Details Name: Brad [name] MRN: [id number] DOB: 05/06/1961 Today's Date: 08/02/2019   History of Present Illness  58 yo male admitted with COPD exacerbation, symptoms including ShOB and generalized swelling LEs>UEs. This is pt's 7th hospitalization in 5 months. PMH includes nonischemic cardiomyopathy, CHF, polysubstance abuse, hep C, DMII, CKDIII, neuropathy.  Clinical Impression   Pt presents with [facility] mobility, gait speed, balance, and endurance for activity. Pt maintained sats >94% on RA, with no dyspnea on exertion noted. Pt's biggest barrier to d/c is homeless status, pt states he has no friends or family that can assist him with housing. Pt with no mobility needs at this time, PT to sign off.      Follow Up Recommendations No PT follow up    Equipment Recommendations  None recommended by PT    Recommendations for Other Services       Precautions / Restrictions Precautions Precautions: (moderate fall risk) Restrictions Weight Bearing Restrictions: No      Mobility  Bed Mobility Overal bed mobility: Modified Independent             General bed mobility comments: increased time, no physical assist required  Transfers Overall transfer level: Needs assistance Equipment used: None Transfers: Sit to/from Stand Sit to Stand: Supervision         General transfer comment: supervision for safety, increased time to rise.  Ambulation/Gait Ambulation/Gait assistance: Supervision Gait Distance (Feet): 450 Feet Assistive device: None Gait Pattern/deviations: WFL(Within Functional Limits) Gait velocity: WFL   General Gait Details: WFL gait speed and parameters, no unsteadiness noted in standing.  Stairs            Wheelchair Mobility    Modified [name] (Stroke Patients Only)       Balance Overall balance assessment: Modified Independent                                           Pertinent  Vitals/Pain Pain Assessment: Faces Faces Pain Scale: Hurts little more Pain Location: generalized Pain Descriptors / Indicators: Discomfort Pain Intervention(s): Limited activity within patient's tolerance;Monitored during session;Repositioned    Home Living Family/patient expects to be discharged to:: Shelter/Homeless                      Prior Function Level of Independence: Independent with assistive device(s)         Comments: pt reports occasionally requiring cane for ambulation, but otherwise is independent in ADLs and walks long distances     Hand Dominance   Dominant Hand: Right    Extremity/Trunk Assessment   Upper Extremity Assessment Upper Extremity Assessment: Overall WFL for tasks assessed    Lower Extremity Assessment Lower Extremity Assessment: Overall WFL for tasks assessed    Cervical / Trunk Assessment Cervical / Trunk Assessment: Normal  Communication   Communication: No difficulties  Cognition Arousal/Alertness: Awake/alert Behavior During Therapy: WFL for tasks assessed/performed Overall Cognitive Status: Within Functional Limits for tasks assessed                                        General Comments General comments (skin integrity, edema, etc.): HR 80-108 bpm, SpO2 94% and greater on RA during ambulation    Exercises     Assessment/Plan  PT Assessment Patent does not need any further PT services  PT Problem List         PT Treatment Interventions      PT Goals (Current goals can be found in the Care Plan section)  Acute Rehab PT Goals Patient Stated Goal: no further PT needs PT Goal Formulation: With patient Time For Goal Achievement: 08/02/19 Potential to Achieve Goals: Good    Frequency     Barriers to discharge        Co-evaluation               AM-PAC PT "6 Clicks" Mobility  Outcome Measure Help needed turning from your back to your side while in a flat bed without using bedrails?:  None Help needed moving from lying on your back to sitting on the side of a flat bed without using bedrails?: None Help needed moving to and from a bed to a chair (including a wheelchair)?: None Help needed standing up from a chair using your arms (e.g., wheelchair or bedside chair)?: None Help needed to walk in hospital room?: None Help needed climbing 3-5 steps with a railing? : None 6 Click Score: 24    End of Session   Activity Tolerance: Patient tolerated treatment well Patient left: in bed;with call bell/phone within reach Nurse Communication: Mobility status PT Visit Diagnosis: Other abnormalities of gait and mobility (R26.89)    Time: 3220-2542 PT Time Calculation (min) (ACUTE ONLY): 12 min   Charges:   PT Evaluation $PT Eval Low Complexity: 1 Low          Brad [name], PT Acute Rehabilitation Services Pager [phone]  Office [phone]   Brad [name] 08/02/2019, 12:56 PM

## 2019-08-02 NOTE — Discharge Summary (Signed)
. Physician Discharge Summary  Brad [name] OVF:[id number] DOB: 1961/08/14 DOA: 08/01/2019  PCP: Charlott Rakes, MD  Admit date: 08/01/2019 Discharge date: 08/02/2019  Admitted From: Home Disposition:  Discharged to home.  Recommendations for Outpatient Follow-up:  1. Follow up with PCP in 1-2 weeks 2. Please obtain BMP/CBC in one week  Discharge Condition: Stable  CODE STATUS: FULL   Brief/Interim Summary: 58 year oldmanPMHnonischemic cardiomyopathy, chronic combined systolic and diastolic CHF, cocaine abuse, heroin abuse, hepatitis C, atrial flutter on Eliquis, diabetes mellitus type 2, alcohol abuse;which was discharged 8/9 from [facility] after being cared for for diarrhea.  [facility] for chest pain and bilateral lower extremity edema. He reports he is out of his diuretic. He felt fine yesterday but woke up this morning at 8:00 with lower extremity edema and shortness of breath. Also describes associated chest pain 10/10 described as pressure-like sensation in the middle of his chest and associated shortness of breath. He reports that he he is out of his diuretic medication did not take any this morning.  Discharge Diagnoses:  Principal Problem:   Acute on chronic combined systolic and diastolic CHF (congestive heart failure) (HCC) Active Problems:   Chronic hepatitis C with cirrhosis (HCC)   DM type 2 (diabetes mellitus, type 2) (HCC)   CKD (chronic kidney disease) stage 3, GFR 30-59 ml/min (HCC)   Cocaine abuse (HCC)   Atrial flutter (HCC)   Chronic hyponatremia   Hyponatremia   Alcohol abuse   Elevated troponin   Chest pain   Acute systolic CHF (congestive heart failure) (HCC)  Acute on chronic combined systolic and diastolic CHF with associated shortness of breath without hypoxia.      - Chest x-ray shows pulmonary edema and the patient has lower extremity edema.  BNP is elevated but less than last admission.      - Echocardiogram 6/5 LVEF 84-16%, diastolic dysfunction     - Inciting event seems to be noncompliance with outpatient diuretic.  Patient reports adherence to low-salt diet.  Atypical chest pain, doubt new cardiac event     - Telemetry, IV diuresis, strict I/O     - Unclear why he is not on an ACE inhibitor.     - says respiratory status not quite back normal (heaviness); let's continue IV lasix through tonight and target him for transition to torsemide in AM     - resume home toresemide at discharge.  Atypical chest pain.  Multiple previous evaluations for the same.  History and exam suggest noncardiac in origin.  High-sensitivity troponin was mildly elevated, although less than last admission     - will repeat hs-troponin x1 but doubt ACS.  Unless there is a significant elevation in the troponin would not pursue further evaluation.     - trp ok, monitor  Polysubstance abuse including cocaine, alcohol     - Check urine drug screen; positive for opiates     - Avoid beta-blocker     - CIWA.  No current history of features to suggest withdrawal.  Hyponatremia     - Secondary to CHF.       - resolved  CKD stage III     - Appears to be at baseline.  Diabetes mellitus type 2     - On lantus at home     - SSI for now; sugars are ok  Hepatitis C     - Follow-up as an outpatient.  Atrial flutter     -  continue apixaban, cardizem  Hypokalemia     - replete, monitor  Discharge Instructions   Allergies as of 08/02/2019      Reactions   Angiotensin Receptor Blockers Anaphylaxis, Other (See Comments)   (Angioedema also with Lisinopril, therefore ARB's are contraindicated)   Lisinopril Anaphylaxis, Other (See Comments)   Throat swells   Pamelor [nortriptyline Hcl] Anaphylaxis, Swelling   Throat swells      Medication List    STOP taking these medications   DULoxetine 60 MG capsule Commonly known as: Cymbalta   mirtazapine 15 MG tablet Commonly known as: Remeron      TAKE these medications   Accu-Chek FastClix Lancets Misc Use as directed three times daily   Accu-Chek Guide w/Device Kit 1 each by Does not apply route 3 (three) times daily.   albuterol 108 (90 Base) MCG/ACT inhaler Commonly known as: VENTOLIN HFA Inhale 2 puffs into the lungs every 6 (six) hours as needed for wheezing or shortness of breath.   apixaban 5 MG Tabs tablet Commonly known as: Eliquis Take 1 tablet (5 mg total) by mouth 2 (two) times daily.   atorvastatin 40 MG tablet Commonly known as: LIPITOR Take 1 tablet (40 mg total) by mouth daily.   dicyclomine 10 MG capsule Commonly known as: Bentyl Take 1 capsule (10 mg total) by mouth 3 (three) times daily as needed for up to 14 days for spasms (abdominal pain/cramps).   diltiazem 30 MG tablet Commonly known as: CARDIZEM Take 1 tablet (30 mg total) by mouth 2 (two) times daily.   Easy Comfort Pen Needles 31G X 8 MM Misc Generic drug: Insulin Pen Needle USE AS DIRECTED TWICE A DAY   ferrous sulfate 325 (65 FE) MG tablet Take 1 tablet (325 mg total) by mouth daily with breakfast.   folic acid 1 MG tablet Commonly known as: FOLVITE Take 6 tablets (6 mg total) by mouth daily.   gabapentin 300 MG capsule Commonly known as: NEURONTIN Take 2 capsules (600 mg total) by mouth 2 (two) times daily.   glucose blood test strip Commonly known as: Accu-Chek Guide Use as directed three times daily   Lantus SoloStar 100 UNIT/ML Solostar Pen Generic drug: Insulin Glargine Inject 8-14 Units into the skin daily. Start with 8 units and titrate per home BG checks (if >200 persistently) as discussed at discharge What changed:   how much to take  when to take this  additional instructions   Magnesium Oxide 400 MG Caps Take 1 capsule (400 mg total) by mouth 2 (two) times a day.   methocarbamol 500 MG tablet Commonly known as: ROBAXIN Take 1 tablet (500 mg total) by mouth 2 (two) times daily.   multivitamin with  minerals Tabs tablet Take 1 tablet by mouth daily.   pantoprazole 40 MG tablet Commonly known as: PROTONIX Take 1 tablet (40 mg total) by mouth daily.   potassium chloride SA 20 MEQ tablet Commonly known as: K-DUR Take 1 tablet (20 mEq total) by mouth daily.   thiamine 100 MG tablet Take 1 tablet (100 mg total) by mouth daily.   torsemide 20 MG tablet Commonly known as: DEMADEX Take 3 tablets (60 mg total) by mouth 2 (two) times daily.       Allergies  Allergen Reactions  . Angiotensin Receptor Blockers Anaphylaxis and Other (See Comments)    (Angioedema also with Lisinopril, therefore ARB's are contraindicated)  . Lisinopril Anaphylaxis and Other (See Comments)    Throat swells  . Pamelor [  Nortriptyline Hcl] Anaphylaxis and Swelling    Throat swells    Consultations:  None   Procedures/Studies: Dg Chest 2 View  Result Date: 08/01/2019 CLINICAL DATA:  Shortness of breath. Cough. Upper and lower extremity swelling. EXAM: CHEST - 2 VIEW COMPARISON:  Two-view chest 07/28/2019 FINDINGS: Heart is enlarged. Mild pulmonary vascular congestion has increased. Bilateral pleural effusions are present. Bibasilar airspace disease is noted. IMPRESSION: 1. Cardiomegaly with pulmonary vascular congestion bilateral effusions suggesting congestive heart failure. 2. Increased bibasilar airspace disease likely reflects atelectasis. Infection is not excluded. Electronically Signed   By: San Morelle M.D.   On: 08/01/2019 12:51   Dg Chest 2 View  Result Date: 07/28/2019 CLINICAL DATA:  Chest pain. Shortness of breath. EXAM: CHEST - 2 VIEW COMPARISON:  07/17/2019 FINDINGS: The cardiac silhouette remains mildly enlarged. Mild opacity is present in both lung bases, new/increased from prior. There may be trace pleural effusions bilaterally. No pneumothorax is identified. No acute osseous abnormality is seen. IMPRESSION: Mild bibasilar opacities suggesting atelectasis. Possible trace pleural  effusions. Electronically Signed   By: Logan Bores M.D.   On: 07/28/2019 16:45   Dg Chest 2 View  Result Date: 07/17/2019 CLINICAL DATA:  Shortness of breath. Lower extremity edema. Congestive heart failure. Cirrhosis. EXAM: CHEST - 2 VIEW COMPARISON:  07/16/2019 FINDINGS: Stable moderate cardiomegaly. Stable tiny left pleural effusion versus pleural thickening. No evidence of pulmonary infiltrate or edema. No significant change compared to prior study. IMPRESSION: Stable cardiomegaly and tiny left pleural effusion versus pleural thickening. No acute findings. Electronically Signed   By: Marlaine Hind M.D.   On: 07/17/2019 17:52   Dg Chest 2 View  Result Date: 07/16/2019 CLINICAL DATA:  Pt complains of central chest pain and BLE swelling x several days; reports hx of CHF,DM, HTN, and atrial flutter; smoker EXAM: CHEST - 2 VIEW COMPARISON:  20/20 20 FINDINGS: The heart is enlarged. No focal consolidations. Small LEFT pleural effusion. No pulmonary edema. IMPRESSION: 1. Cardiomegaly. 2. Small LEFT pleural effusion. Electronically Signed   By: Nolon Nations M.D.   On: 07/16/2019 15:54   Dg Chest 2 View  Result Date: 07/11/2019 CLINICAL DATA:  Chest pain EXAM: CHEST - 2 VIEW COMPARISON:  June 19, 2019 FINDINGS: Stable cardiomegaly. No pneumothorax. Scarring or atelectasis in the left perihilar region and left base. No other interval changes. IMPRESSION: No active cardiopulmonary disease. Electronically Signed   By: Dorise Bullion III M.D   On: 07/11/2019 12:39   Ct Abdomen Pelvis W Contrast  Result Date: 07/29/2019 CLINICAL DATA:  Abdominal pain, shortness of breath. EXAM: CT ABDOMEN AND PELVIS WITH CONTRAST TECHNIQUE: Multidetector CT imaging of the abdomen and pelvis was performed using the standard protocol following bolus administration of intravenous contrast. CONTRAST:  177m OMNIPAQUE IOHEXOL 300 MG/ML  SOLN COMPARISON:  08/16/2018 FINDINGS: Lower chest: Bilateral pleural effusions, right  greater than left. Cardiomegaly. Hepatobiliary: Nodular contours of the liver compatible with cirrhosis. No focal hepatic abnormality or biliary ductal dilatation. Gallbladder grossly unremarkable. Pancreas: No focal abnormality or ductal dilatation. Spleen: No focal abnormality.  Normal size. Adrenals/Urinary Tract: No adrenal abnormality. No focal renal abnormality. No stones or hydronephrosis. Urinary bladder is unremarkable. Stomach/Bowel: Normal appendix. Stomach, large and small bowel grossly unremarkable. Scattered colonic diverticula. No active diverticulitis. Vascular/Lymphatic: Aortic atherosclerosis. No enlarged abdominal or pelvic lymph nodes. Reproductive: Mild prostate enlargement. Other: No free fluid or free air. Musculoskeletal: No acute bony abnormality. IMPRESSION: Small bilateral pleural effusions, right slightly larger than left. This is  similar to prior study. Cardiomegaly. Changes of cirrhosis. Aortic atherosclerosis. Scattered diverticulosis. Prostate enlargement. No acute findings. Electronically Signed   By: Rolm Baptise M.D.   On: 07/29/2019 03:01      Subjective: "I wasn't offered anything for my pain."  Discharge Exam: Vitals:   08/02/19 0426 08/02/19 1337  BP: (!) 135/102 101/62  Pulse: 92 72  Resp: 18 18  Temp: 97.8 F (36.6 C) 97.9 F (36.6 C)  SpO2: 96% 99%   Vitals:   08/01/19 2036 08/02/19 0426 08/02/19 0428 08/02/19 1337  BP: (!) 153/101 (!) 135/102  101/62  Pulse: 99 92  72  Resp: '18 18  18  ' Temp: 98.2 F (36.8 C) 97.8 F (36.6 C)  97.9 F (36.6 C)  TempSrc: Oral Oral  Oral  SpO2: 99% 96%  99%  Weight:   76.9 kg   Height:        General: 58 y.o. male resting in bed in NAD Eyes: PERRL, normal sclera ENMT: Nares patent w/o discharge, orophaynx clear, dentition normal, ears w/o discharge/lesions/ulcers Cardiovascular: RRR, +S1, S2, no m/g/r, equal pulses throughout Respiratory: CTABL, no w/r/r, normal WOB GI: BS+, NDNT, no masses noted, no  organomegaly noted MSK: No c/c; trace edema Skin: No rashes, bruises, ulcerations noted Neuro: A&O x 3, no focal deficits Psyc: Appropriate interaction and affect, calm/cooperative   The results of significant diagnostics from this hospitalization (including imaging, microbiology, ancillary and laboratory) are listed below for reference.     Microbiology: Recent Results (from the past 240 hour(s))  C difficile quick scan w PCR reflex     Status: None   Collection Time: 07/29/19  3:55 AM   Specimen: STOOL  Result Value Ref Range Status   C Diff antigen NEGATIVE NEGATIVE Final   C Diff toxin NEGATIVE NEGATIVE Final   C Diff interpretation No C. difficile detected.  Final    Comment: Performed at [facility], [place] [address], [place], [place] [address]  Gastrointestinal Panel by PCR , Stool     Status: None   Collection Time: 07/29/19  3:55 AM   Specimen: STOOL  Result Value Ref Range Status   Campylobacter species NOT DETECTED NOT DETECTED Final   Plesimonas shigelloides NOT DETECTED NOT DETECTED Final   Salmonella species NOT DETECTED NOT DETECTED Final   Yersinia enterocolitica NOT DETECTED NOT DETECTED Final   Vibrio species NOT DETECTED NOT DETECTED Final   Vibrio cholerae NOT DETECTED NOT DETECTED Final   Enteroaggregative E coli (EAEC) NOT DETECTED NOT DETECTED Final   Enteropathogenic E coli (EPEC) NOT DETECTED NOT DETECTED Final   Enterotoxigenic E coli (ETEC) NOT DETECTED NOT DETECTED Final   Shiga like toxin producing E coli (STEC) NOT DETECTED NOT DETECTED Final   Shigella/Enteroinvasive E coli (EIEC) NOT DETECTED NOT DETECTED Final   Cryptosporidium NOT DETECTED NOT DETECTED Final   Cyclospora cayetanensis NOT DETECTED NOT DETECTED Final   Entamoeba histolytica NOT DETECTED NOT DETECTED Final   Giardia lamblia NOT DETECTED NOT DETECTED Final   Adenovirus F40/41 NOT DETECTED NOT DETECTED Final   Astrovirus NOT DETECTED NOT DETECTED Final   Norovirus GI/GII NOT  DETECTED NOT DETECTED Final   Rotavirus A NOT DETECTED NOT DETECTED Final   Sapovirus (I, II, IV, and V) NOT DETECTED NOT DETECTED Final    Comment: Performed at [facility], [address] [place], [place] [address]  SARS CORONAVIRUS 2 Nasal Swab Aptima Multi Swab     Status: None  Collection Time: 07/29/19  7:41 AM   Specimen: Aptima Multi Swab; Nasal Swab  Result Value Ref Range Status   SARS Coronavirus 2 NEGATIVE NEGATIVE Final    Comment: (NOTE) SARS-CoV-2 target nucleic acids are NOT DETECTED. The SARS-CoV-2 RNA is generally detectable in upper and lower respiratory specimens during the acute phase of infection. Negative results do not preclude SARS-CoV-2 infection, do not rule out co-infections with other pathogens, and should not be used as the sole basis for treatment or other patient management decisions. Negative results must be combined with clinical observations, patient history, and epidemiological information. The expected result is Negative. Fact Sheet for Patients: SugarRoll.be Fact Sheet for Healthcare Providers: https://www.woods-mathews.com/ This test is not yet approved or cleared by the [place] FDA and  has been authorized for detection and/or diagnosis of SARS-CoV-2 by FDA under an Emergency Use Authorization (EUA). This EUA will remain  in effect (meaning this test can be used) for the duration of the COVID-19 declaration under Section 56 4(b)(1) of the Act, 21 U.S.C. section 360bbb-3(b)(1), unless the authorization is terminated or revoked sooner. Performed at [facility], [place] [address] [place], [place] [address]      Labs: BNP (last 3 results) Recent Labs    07/17/19 1823 07/29/19 1807 08/01/19 1349  BNP 1,998.2* 2,012.0* 6,387.5*   Basic Metabolic Panel: Recent Labs  Lab 07/28/19 1947 07/29/19 1807 07/30/19 0232 07/31/19 0319 08/01/19 1349 08/02/19 0409  NA  --  126*  129* 132* 134* 137  K  --  3.5 3.7 3.9 4.0 3.1*  CL  --  91* 90* 94* 94* 99  CO2  --  '25 28 26 29 28  ' GLUCOSE  --  139* 161* 241* 161* 148*  BUN  --  18 20 25* 23* 23*  CREATININE  --  1.31* 1.41* 1.46* 1.55* 1.32*  CALCIUM  --  7.9* 8.5* 8.2* 8.7* 8.2*  MG 1.3* 2.3 2.0 1.7  --   --    Liver Function Tests: Recent Labs  Lab 07/28/19 1947 07/31/19 0319  AST 52* 27  ALT 45* 28  ALKPHOS 89 77  BILITOT 0.8 0.5  PROT 7.5 5.8*  ALBUMIN 2.8* 2.3*   Recent Labs  Lab 07/28/19 1947  LIPASE 36   No results for input(s): AMMONIA in the last 168 hours. CBC: Recent Labs  Lab 07/28/19 1607 07/30/19 0232 07/31/19 0319 08/01/19 1349  WBC 4.8 5.2 3.9* 4.8  NEUTROABS  --   --  2.7  --   HGB 13.8 13.9 12.6* 14.5  HCT 41.1 41.3 36.7* 43.0  MCV 93.4 92.8 93.4 94.7  PLT 242 219 187 257   Cardiac Enzymes: No results for input(s): CKTOTAL, CKMB, CKMBINDEX, TROPONINI in the last 168 hours. BNP: Invalid input(s): POCBNP CBG: Recent Labs  Lab 07/31/19 0819 07/31/19 1222 08/01/19 2037 08/02/19 0725 08/02/19 1136  GLUCAP 184* 89 181* 146* 152*   D-Dimer No results for input(s): DDIMER in the last 72 hours. Hgb A1c No results for input(s): HGBA1C in the last 72 hours. Lipid Profile No results for input(s): CHOL, HDL, LDLCALC, TRIG, CHOLHDL, LDLDIRECT in the last 72 hours. Thyroid function studies No results for input(s): TSH, T4TOTAL, T3FREE, THYROIDAB in the last 72 hours.  Invalid input(s): FREET3 Anemia work up No results for input(s): VITAMINB12, FOLATE, FERRITIN, TIBC, IRON, RETICCTPCT in the last 72 hours. Urinalysis    Component Value Date/Time   COLORURINE YELLOW 07/29/2019 [place] 07/29/2019 1421  LABSPEC 1.033 (H) 07/29/2019 1421   PHURINE 6.0 07/29/2019 1421   GLUCOSEU NEGATIVE 07/29/2019 1421   HGBUR SMALL (A) 07/29/2019 1421   BILIRUBINUR NEGATIVE 07/29/2019 1421   BILIRUBINUR neg 05/08/2017 1503   KETONESUR NEGATIVE 07/29/2019 1421    PROTEINUR 100 (A) 07/29/2019 1421   UROBILINOGEN 0.2 05/08/2017 1503   UROBILINOGEN 1.0 04/26/2012 1328   NITRITE NEGATIVE 07/29/2019 1421   LEUKOCYTESUR NEGATIVE 07/29/2019 1421   Sepsis Labs Invalid input(s): PROCALCITONIN,  WBC,  LACTICIDVEN Microbiology Recent Results (from the past 240 hour(s))  C difficile quick scan w PCR reflex     Status: None   Collection Time: 07/29/19  3:55 AM   Specimen: STOOL  Result Value Ref Range Status   C Diff antigen NEGATIVE NEGATIVE Final   C Diff toxin NEGATIVE NEGATIVE Final   C Diff interpretation No C. difficile detected.  Final    Comment: Performed at [facility], [place] [address] [place], [place] [address]  Gastrointestinal Panel by PCR , Stool     Status: None   Collection Time: 07/29/19  3:55 AM   Specimen: STOOL  Result Value Ref Range Status   Campylobacter species NOT DETECTED NOT DETECTED Final   Plesimonas shigelloides NOT DETECTED NOT DETECTED Final   Salmonella species NOT DETECTED NOT DETECTED Final   Yersinia enterocolitica NOT DETECTED NOT DETECTED Final   Vibrio species NOT DETECTED NOT DETECTED Final   Vibrio cholerae NOT DETECTED NOT DETECTED Final   Enteroaggregative E coli (EAEC) NOT DETECTED NOT DETECTED Final   Enteropathogenic E coli (EPEC) NOT DETECTED NOT DETECTED Final   Enterotoxigenic E coli (ETEC) NOT DETECTED NOT DETECTED Final   Shiga like toxin producing E coli (STEC) NOT DETECTED NOT DETECTED Final   Shigella/Enteroinvasive E coli (EIEC) NOT DETECTED NOT DETECTED Final   Cryptosporidium NOT DETECTED NOT DETECTED Final   Cyclospora cayetanensis NOT DETECTED NOT DETECTED Final   Entamoeba histolytica NOT DETECTED NOT DETECTED Final   Giardia lamblia NOT DETECTED NOT DETECTED Final   Adenovirus F40/41 NOT DETECTED NOT DETECTED Final   Astrovirus NOT DETECTED NOT DETECTED Final   Norovirus GI/GII NOT DETECTED NOT DETECTED Final   Rotavirus A NOT DETECTED NOT DETECTED Final   Sapovirus (I, II, IV,  and V) NOT DETECTED NOT DETECTED Final    Comment: Performed at [facility], [place] [address] [place], [place] [address]  SARS CORONAVIRUS 2 Nasal Swab Aptima Multi Swab     Status: None   Collection Time: 07/29/19  7:41 AM   Specimen: Aptima Multi Swab; Nasal Swab  Result Value Ref Range Status   SARS Coronavirus 2 NEGATIVE NEGATIVE Final    Comment: (NOTE) SARS-CoV-2 target nucleic acids are NOT DETECTED. The SARS-CoV-2 RNA is generally detectable in upper and lower respiratory specimens during the acute phase of infection. Negative results do not preclude SARS-CoV-2 infection, do not rule out co-infections with other pathogens, and should not be used as the sole basis for treatment or other patient management decisions. Negative results must be combined with clinical observations, patient history, and epidemiological information. The expected result is Negative. Fact Sheet for Patients: SugarRoll.be Fact Sheet for Healthcare Providers: https://www.woods-mathews.com/ This test is not yet approved or cleared by the [place] FDA and  has been authorized for detection and/or diagnosis of SARS-CoV-2 by FDA under an Emergency Use Authorization (EUA). This EUA will remain  in effect (meaning this test can be used) for the duration of the COVID-19 declaration under  Section 56 4(b)(1) of the Act, 21 U.S.C. section 360bbb-3(b)(1), unless the authorization is terminated or revoked sooner. Performed at [facility], [place] [address], [place],  [address]      Time coordinating discharge: 25 minutes  SIGNED:   Jonnie Finner, DO  [facility] 08/02/2019, 2:21 PM Pager   If 7PM-7AM, please contact night-coverage [email or web address] Password [email or web address]

## 2019-08-02 NOTE — Progress Notes (Signed)
Physical Therapy Discharge Patient Details Name: Parker Wherley MRN: [id number] DOB: 1961/05/13 Today's Date: 08/02/2019 Time: 5582-8332 PT Time Calculation (min) (ACUTE ONLY): 12 min  Patient discharged from PT services secondary to goals met and no further PT needs identified.  Please see latest therapy progress note for current level of functioning and progress toward goals.    Progress and discharge plan discussed with patient and/or caregiver: Patient/Caregiver agrees with plan  Julien Girt, PT Acute Rehabilitation Services Pager [phone]  Office [phone]  Jovin Fester D [name] 08/02/2019, 1:02 PM

## 2019-08-02 NOTE — Progress Notes (Signed)
Marland Kitchen  PROGRESS NOTE    Brad [name]  RXV:[id number] DOB: 1961-10-16 DOA: 08/01/2019 PCP: Charlott Rakes, MD   Brief Narrative:   58 year old man PMH nonischemic cardiomyopathy, chronic combined systolic and diastolic CHF, cocaine abuse, heroin abuse, hepatitis C, atrial flutter on Eliquis, diabetes mellitus type 2, alcohol abuse; which was discharged 8/9 from [facility] after being cared for for diarrhea.  PRESENTED to the [facility] emergency department 8/10 for chest pain and bilateral lower extremity edema.  He reports he is out of his diuretic.  He felt fine yesterday but woke up this morning at 8:00 with lower extremity edema and shortness of breath.  Also describes associated chest pain 10/10 described as pressure-like sensation in the middle of his chest and associated shortness of breath.  He reports that he he is out of his diuretic medication did not take any this morning.   Assessment & Plan:   Principal Problem:   Acute on chronic combined systolic and diastolic CHF (congestive heart failure) (HCC) Active Problems:   Chronic hepatitis C with cirrhosis (HCC)   DM type 2 (diabetes mellitus, type 2) (HCC)   CKD (chronic kidney disease) stage 3, GFR 30-59 ml/min (HCC)   Cocaine abuse (HCC)   Atrial flutter (HCC)   Chronic hyponatremia   Hyponatremia   Alcohol abuse   Elevated troponin   Chest pain   Acute on chronic combined systolic and diastolic CHF with associated shortness of breath without hypoxia.      - Chest x-ray shows pulmonary edema and the patient has lower extremity edema.  BNP is elevated but less than last admission.     - Echocardiogram 6/5 LVEF 50-93%, diastolic dysfunction     - Inciting event seems to be noncompliance with outpatient diuretic.  Patient reports adherence to low-salt diet.  Atypical chest pain, doubt new cardiac event     - Telemetry, IV diuresis, strict I/O     - Unclear why he is not on an ACE inhibitor.     - says respiratory status  not quite back normal (heaviness); let's continue IV lasix through tonight and target him for transition to torsemide in AM  Atypical chest pain.  Multiple previous evaluations for the same.  History and exam suggest noncardiac in origin.  High-sensitivity troponin was mildly elevated, although less than last admission     - will repeat hs-troponin x1 but doubt ACS.  Unless there is a significant elevation in the troponin would not pursue further evaluation.     - trp ok, monitor  Polysubstance abuse including cocaine, alcohol     - Check urine drug screen; positive for opiates     - Avoid beta-blocker     - CIWA.  No current history of features to suggest withdrawal.  Hyponatremia     - Secondary to CHF.       - resolved  CKD stage III     - Appears to be at baseline.  Diabetes mellitus type 2     - On lantus at home     - SSI for now; sugars are ok  Hepatitis C     - Follow-up as an outpatient.  Atrial flutter     - continue apixaban, cardizem  Hypokalemia     - replete, monitor  DVT prophylaxis: apixaban Code Status: FULL Family Communication: None at bedside   Disposition Plan: To home when diuresis complete   ROS:  Reports dyspnea, CP. Denies palpitations, N, V, edema .  Remainder 10-pt ROS is negative for all not previously mentioned.  Subjective: "I wasn't offered anything for my pain."  Objective: Vitals:   08/01/19 1731 08/01/19 2036 08/02/19 0426 08/02/19 0428  BP: (!) 144/100 (!) 153/101 (!) 135/102   Pulse: (!) 105 99 92   Resp: 18 18 18    Temp: 97.8 F (36.6 C) 98.2 F (36.8 C) 97.8 F (36.6 C)   TempSrc: Oral Oral Oral   SpO2: 100% 99% 96%   Weight: 77.8 kg   76.9 kg  Height: 5\' 11"  (1.803 m)       Intake/Output Summary (Last 24 hours) at 08/02/2019 1323 Last data filed at 08/02/2019 0930 Gross per 24 hour  Intake 1083 ml  Output 1200 ml  Net -117 ml   Filed Weights   08/01/19 1731 08/02/19 0428  Weight: 77.8 kg 76.9 kg     Examination:  General: 58 y.o. male resting in bed in NAD Eyes: PERRL, normal sclera ENMT: Nares patent w/o discharge, orophaynx clear, dentition normal, ears w/o discharge/lesions/ulcers Cardiovascular: RRR, +S1, S2, no m/g/r, equal pulses throughout Respiratory: CTABL, no w/r/r, normal WOB GI: BS+, NDNT, no masses noted, no organomegaly noted MSK: No c/c; trace edema Skin: No rashes, bruises, ulcerations noted Neuro: A&O x 3, no focal deficits Psyc: Appropriate interaction and affect, calm/cooperative   Data Reviewed: I have personally reviewed following labs and imaging studies.  CBC: Recent Labs  Lab 07/28/19 1607 07/30/19 0232 07/31/19 0319 08/01/19 1349  WBC 4.8 5.2 3.9* 4.8  NEUTROABS  --   --  2.7  --   HGB 13.8 13.9 12.6* 14.5  HCT 41.1 41.3 36.7* 43.0  MCV 93.4 92.8 93.4 94.7  PLT 242 219 187 924   Basic Metabolic Panel: Recent Labs  Lab 07/28/19 1947 07/29/19 1807 07/30/19 0232 07/31/19 0319 08/01/19 1349 08/02/19 0409  NA  --  126* 129* 132* 134* 137  K  --  3.5 3.7 3.9 4.0 3.1*  CL  --  91* 90* 94* 94* 99  CO2  --  25 28 26 29 28   GLUCOSE  --  139* 161* 241* 161* 148*  BUN  --  18 20 25* 23* 23*  CREATININE  --  1.31* 1.41* 1.46* 1.55* 1.32*  CALCIUM  --  7.9* 8.5* 8.2* 8.7* 8.2*  MG 1.3* 2.3 2.0 1.7  --   --    GFR: Estimated Creatinine Clearance: 64.2 mL/min (A) (by C-G formula based on SCr of 1.32 mg/dL (H)). Liver Function Tests: Recent Labs  Lab 07/28/19 1947 07/31/19 0319  AST 52* 27  ALT 45* 28  ALKPHOS 89 77  BILITOT 0.8 0.5  PROT 7.5 5.8*  ALBUMIN 2.8* 2.3*   Recent Labs  Lab 07/28/19 1947  LIPASE 36   No results for input(s): AMMONIA in the last 168 hours. Coagulation Profile: No results for input(s): INR, PROTIME in the last 168 hours. Cardiac Enzymes: No results for input(s): CKTOTAL, CKMB, CKMBINDEX, TROPONINI in the last 168 hours. BNP (last 3 results) No results for input(s): PROBNP in the last 8760 hours. HbA1C:  No results for input(s): HGBA1C in the last 72 hours. CBG: Recent Labs  Lab 07/31/19 0819 07/31/19 1222 08/01/19 2037 08/02/19 0725 08/02/19 1136  GLUCAP 184* 89 181* 146* 152*   Lipid Profile: No results for input(s): CHOL, HDL, LDLCALC, TRIG, CHOLHDL, LDLDIRECT in the last 72 hours. Thyroid Function Tests: No results for input(s): TSH, T4TOTAL, FREET4, T3FREE, THYROIDAB in the last 72 hours. Anemia Panel: No  results for input(s): VITAMINB12, FOLATE, FERRITIN, TIBC, IRON, RETICCTPCT in the last 72 hours. Sepsis Labs: No results for input(s): PROCALCITON, LATICACIDVEN in the last 168 hours.  Recent Results (from the past 240 hour(s))  C difficile quick scan w PCR reflex     Status: None   Collection Time: 07/29/19  3:55 AM   Specimen: STOOL  Result Value Ref Range Status   C Diff antigen NEGATIVE NEGATIVE Final   C Diff toxin NEGATIVE NEGATIVE Final   C Diff interpretation No C. difficile detected.  Final    Comment: Performed at [facility], [place] [address] [place], [place] [address]  Gastrointestinal Panel by PCR , Stool     Status: None   Collection Time: 07/29/19  3:55 AM   Specimen: STOOL  Result Value Ref Range Status   Campylobacter species NOT DETECTED NOT DETECTED Final   Plesimonas shigelloides NOT DETECTED NOT DETECTED Final   Salmonella species NOT DETECTED NOT DETECTED Final   Yersinia enterocolitica NOT DETECTED NOT DETECTED Final   Vibrio species NOT DETECTED NOT DETECTED Final   Vibrio cholerae NOT DETECTED NOT DETECTED Final   Enteroaggregative E coli (EAEC) NOT DETECTED NOT DETECTED Final   Enteropathogenic E coli (EPEC) NOT DETECTED NOT DETECTED Final   Enterotoxigenic E coli (ETEC) NOT DETECTED NOT DETECTED Final   Shiga like toxin producing E coli (STEC) NOT DETECTED NOT DETECTED Final   Shigella/Enteroinvasive E coli (EIEC) NOT DETECTED NOT DETECTED Final   Cryptosporidium NOT DETECTED NOT DETECTED Final   Cyclospora cayetanensis NOT DETECTED  NOT DETECTED Final   Entamoeba histolytica NOT DETECTED NOT DETECTED Final   Giardia lamblia NOT DETECTED NOT DETECTED Final   Adenovirus F40/41 NOT DETECTED NOT DETECTED Final   Astrovirus NOT DETECTED NOT DETECTED Final   Norovirus GI/GII NOT DETECTED NOT DETECTED Final   Rotavirus A NOT DETECTED NOT DETECTED Final   Sapovirus (I, II, IV, and V) NOT DETECTED NOT DETECTED Final    Comment: Performed at [facility], [address] [place], [place] [address]  SARS CORONAVIRUS 2 Nasal Swab Aptima Multi Swab     Status: None   Collection Time: 07/29/19  7:41 AM   Specimen: Aptima Multi Swab; Nasal Swab  Result Value Ref Range Status   SARS Coronavirus 2 NEGATIVE NEGATIVE Final    Comment: (NOTE) SARS-CoV-2 target nucleic acids are NOT DETECTED. The SARS-CoV-2 RNA is generally detectable in upper and lower respiratory specimens during the acute phase of infection. Negative results do not preclude SARS-CoV-2 infection, do not rule out co-infections with other pathogens, and should not be used as the sole basis for treatment or other patient management decisions. Negative results must be combined with clinical observations, patient history, and epidemiological information. The expected result is Negative. Fact Sheet for Patients: SugarRoll.be Fact Sheet for Healthcare Providers: https://www.woods-mathews.com/ This test is not yet approved or cleared by the [place] FDA and  has been authorized for detection and/or diagnosis of SARS-CoV-2 by FDA under an Emergency Use Authorization (EUA). This EUA will remain  in effect (meaning this test can be used) for the duration of the COVID-19 declaration under Section 56 4(b)(1) of the Act, 21 U.S.C. section 360bbb-3(b)(1), unless the authorization is terminated or revoked sooner. Performed at [facility], [place] [address], [place], [place] [address]       Radiology Studies: Dg  Chest 2 View  Result Date: 08/01/2019 CLINICAL DATA:  Shortness of breath. Cough. Upper and lower  extremity swelling. EXAM: CHEST - 2 VIEW COMPARISON:  Two-view chest 07/28/2019 FINDINGS: Heart is enlarged. Mild pulmonary vascular congestion has increased. Bilateral pleural effusions are present. Bibasilar airspace disease is noted. IMPRESSION: 1. Cardiomegaly with pulmonary vascular congestion bilateral effusions suggesting congestive heart failure. 2. Increased bibasilar airspace disease likely reflects atelectasis. Infection is not excluded. Electronically Signed   By: San Morelle M.D.   On: 08/01/2019 12:51     Scheduled Meds: . apixaban  5 mg Oral BID  . aspirin EC  81 mg Oral Daily  . atorvastatin  40 mg Oral q1800  . diltiazem  30 mg Oral BID  . folic acid  1 mg Oral Daily  . furosemide  40 mg Intravenous Q12H  . gabapentin  600 mg Oral BID  . insulin aspart  0-5 Units Subcutaneous QHS  . insulin aspart  0-9 Units Subcutaneous TID WC  . multivitamin with minerals  1 tablet Oral Daily  . pantoprazole  40 mg Oral Daily  . sodium chloride flush  3 mL Intravenous Q12H  . thiamine  100 mg Oral Daily   Or  . thiamine  100 mg Intravenous Daily   Continuous Infusions: . sodium chloride       LOS: 0 days    Time spent: 25 minutes spent in the coordination of care today.    Jonnie Finner, DO [facility] Pager [phone]  If 7PM-7AM, please contact night-coverage [email or web address] Password [email or web address] 08/02/2019, 1:23 PM

## 2019-08-03 ENCOUNTER — Telehealth: Payer: Self-pay

## 2019-08-03 ENCOUNTER — Ambulatory Visit: Payer: Medicaid Other | Admitting: Nurse Practitioner

## 2019-08-03 LAB — CBC WITH DIFFERENTIAL/PLATELET
Abs Immature Granulocytes: 0 10*3/uL (ref 0.00–0.07)
Basophils Absolute: 0 10*3/uL (ref 0.0–0.1)
Basophils Relative: 1 %
Eosinophils Absolute: 0.1 10*3/uL (ref 0.0–0.5)
Eosinophils Relative: 2 %
HCT: 42.4 % (ref 39.0–52.0)
Hemoglobin: 13.9 g/dL (ref 13.0–17.0)
Immature Granulocytes: 0 %
Lymphocytes Relative: 16 %
Lymphs Abs: 0.8 10*3/uL (ref 0.7–4.0)
MCH: 31.6 pg (ref 26.0–34.0)
MCHC: 32.8 g/dL (ref 30.0–36.0)
MCV: 96.4 fL (ref 80.0–100.0)
Monocytes Absolute: 0.5 10*3/uL (ref 0.1–1.0)
Monocytes Relative: 11 %
Neutro Abs: 3.5 10*3/uL (ref 1.7–7.7)
Neutrophils Relative %: 70 %
Platelets: 218 10*3/uL (ref 150–400)
RBC: 4.4 MIL/uL (ref 4.22–5.81)
RDW: 13.8 % (ref 11.5–15.5)
WBC: 5 10*3/uL (ref 4.0–10.5)
nRBC: 0 % (ref 0.0–0.2)

## 2019-08-03 LAB — RENAL FUNCTION PANEL
Albumin: 2.7 g/dL — ABNORMAL LOW (ref 3.5–5.0)
Anion gap: 9 (ref 5–15)
BUN: 26 mg/dL — ABNORMAL HIGH (ref 6–20)
CO2: 27 mmol/L (ref 22–32)
Calcium: 8.4 mg/dL — ABNORMAL LOW (ref 8.9–10.3)
Chloride: 100 mmol/L (ref 98–111)
Creatinine, Ser: 1.46 mg/dL — ABNORMAL HIGH (ref 0.61–1.24)
GFR calc Af Amer: >60 mL/min (ref >60)
GFR calc non Af Amer: 52 mL/min — ABNORMAL LOW (ref >60)
Glucose, Bld: 152 mg/dL — ABNORMAL HIGH (ref 70–99)
Phosphorus: 2.8 mg/dL (ref 2.5–4.6)
Potassium: 3.9 mmol/L (ref 3.5–5.1)
Sodium: 136 mmol/L (ref 135–145)

## 2019-08-03 LAB — GLUCOSE, CAPILLARY
Glucose-Capillary: 202 mg/dL — ABNORMAL HIGH (ref 70–99)
Glucose-Capillary: 188 mg/dL — ABNORMAL HIGH (ref 70–99)
Glucose-Capillary: 123 mg/dL — ABNORMAL HIGH (ref 70–99)
Glucose-Capillary: 113 mg/dL — ABNORMAL HIGH (ref 70–99)

## 2019-08-03 LAB — MAGNESIUM: Magnesium: 1.7 mg/dL (ref 1.7–2.4)

## 2019-08-03 MED ORDER — TRAMADOL HCL 50 MG PO TABS
100.0000 mg | ORAL_TABLET | Freq: Three times a day (TID) | ORAL | Status: DC | PRN
  Administered 2019-08-03 – 2019-08-04 (×3): 100 mg via ORAL
  Filled 2019-08-03 (×3): qty 2

## 2019-08-03 MED ORDER — MORPHINE SULFATE (PF) 2 MG/ML IV SOLN
0.5000 mg | Freq: Once | INTRAVENOUS | Status: AC
  Administered 2019-08-03: 0.5 mg via INTRAVENOUS
  Filled 2019-08-03: qty 1

## 2019-08-03 MED ORDER — MAGNESIUM SULFATE 2 GM/50ML IV SOLN
2.0000 g | Freq: Once | INTRAVENOUS | Status: AC
  Administered 2019-08-03: 2 g via INTRAVENOUS
  Filled 2019-08-03: qty 50

## 2019-08-03 MED ORDER — FUROSEMIDE 10 MG/ML IJ SOLN
40.0000 mg | Freq: Two times a day (BID) | INTRAMUSCULAR | Status: DC
  Administered 2019-08-03 – 2019-08-04 (×2): 40 mg via INTRAVENOUS
  Filled 2019-08-03 (×2): qty 4

## 2019-08-03 MED ORDER — ZOLPIDEM TARTRATE 5 MG PO TABS
5.0000 mg | ORAL_TABLET | Freq: Once | ORAL | Status: AC
  Administered 2019-08-03: 5 mg via ORAL
  Filled 2019-08-03: qty 1

## 2019-08-03 NOTE — Progress Notes (Signed)
RN called NP because pt had increased WOB and mild tachypnea. No hypoxia. NP to bedside.  S: says he got up to bathroom and got SOB. Feels better now that he is back to bed. States he feels a lot better when he first came to the hospital.  O: well appearing male in NAD. Afebrile. BP 130/100. HR 101. RR 24, but 18 on exam. No increased WOB noted. Lungs with good air exchange except slightly diminished in the LLL. No wheezing, crackles, or rhonchi. He is able to speak in full sentences without noted distress or SOB. No LE edema.  A/P: 1. DOE-resolved. Likely secondary to CHF and pleural effusion. On Lasix bid. Since he is in no acute distress, will hold off on extra Lasix for now and await CXR results.  KJKG, NP [facility]

## 2019-08-03 NOTE — Telephone Encounter (Signed)
Call placed to [facility].  She explained that at the present time, the shelters are full - [facility] and [facility] in [place].  The patient had been referred to her in the past and was housed at a motel for COVID testing by the health department and transfer to shelter but he did not comply with the plan and left the motel to stay with another individual in lieu of going to the [facility]. He is not eligible for that program with the health department again.   Call placed to patient and informed him of above. Inquired if he has contacted his sister about the key for his mother's house. She may need to have a locksmith make a new key.  He said that he had a staff member assist him with composing an email and he is waiting for her response.  He has not heard from his friend, [name], who he had been staying with prior to his hospitalization and [name] has all of patient's belongings and medications in his ( Mike's ) truck.

## 2019-08-03 NOTE — Evaluation (Signed)
Occupational Therapy Evaluation Patient Details Name: Brad [name] MRN: [id number] DOB: 1961-01-22 Today's Date: 08/03/2019    History of Present Illness 58 yo male admitted with COPD exacerbation, symptoms including ShOB and generalized swelling LEs>UEs. This is pt's 7th hospitalization in 5 months. PMH includes nonischemic cardiomyopathy, CHF, polysubstance abuse, hep C, DMII, CKDIII, neuropathy.   Clinical Impression   Pt was admitted for the above.  He is independent with adls and verbalizes understanding of energy conservation. He did not experience dyspnea with OT, but educated on pursed lip breathing when this happens, as he has been experiencing it at times.      Follow Up Recommendations  No OT follow up    Equipment Recommendations  None recommended by OT    Recommendations for Other Services       Precautions / Restrictions Restrictions Weight Bearing Restrictions: No      Mobility Bed Mobility Overal bed mobility: Modified Independent             General bed mobility comments: HOB raised  Transfers Overall transfer level: Modified independent Equipment used: None                  Balance Overall balance assessment: Modified Independent                                         ADL either performed or assessed with clinical judgement   ADL Overall ADL's : Independent                                       General ADL Comments: pt crosses legs for adls; able to retrieve items.  He walked in hall and did not get dyspnic.  Sats 97% on RA. Noted, pt was SOB yesterday. Educated on pursed lip breathing. Pt states he is good with taking rest breaks and sitting when he can for activities     Vision         Perception     Praxis      Pertinent Vitals/Pain Pain Assessment: Faces Faces Pain Scale: Hurts little more Pain Location: generalized Pain Descriptors / Indicators: Discomfort Pain Intervention(s):  Limited activity within patient's tolerance;Monitored during session;Repositioned     Hand Dominance Right   Extremity/Trunk Assessment Upper Extremity Assessment Upper Extremity Assessment: Overall WFL for tasks assessed           Communication Communication Communication: No difficulties   Cognition Arousal/Alertness: Awake/alert Behavior During Therapy: WFL for tasks assessed/performed Overall Cognitive Status: Within Functional Limits for tasks assessed                                     General Comments  HR 108. sats 97% on RA    Exercises     Shoulder Instructions      Home Living Family/patient expects to be discharged to:: Shelter/Homeless Living Arrangements: Non-relatives/Friends                               Additional Comments: reports recently with sister; has also been staying at hotels      Prior Functioning/Environment Level of Independence: Independent  OT Problem List:        OT Treatment/Interventions:      OT Goals(Current goals can be found in the care plan section) Acute Rehab OT Goals Patient Stated Goal: none stated OT Goal Formulation: All assessment and education complete, DC therapy  OT Frequency:     Barriers to D/C:            Co-evaluation              AM-PAC OT "6 Clicks" Daily Activity     Outcome Measure Help from another person eating meals?: None Help from another person taking care of personal grooming?: None Help from another person toileting, which includes using toliet, bedpan, or urinal?: None Help from another person bathing (including washing, rinsing, drying)?: None Help from another person to put on and taking off regular upper body clothing?: None Help from another person to put on and taking off regular lower body clothing?: None 6 Click Score: 24   End of Session    Activity Tolerance: Patient tolerated treatment well Patient left: in bed;with  call bell/phone within reach  OT Visit Diagnosis: Muscle weakness (generalized) (M62.81)                Time: 9381-0175 OT Time Calculation (min): 20 min Charges:  OT General Charges $OT Visit: 1 Visit OT Evaluation $OT Eval Low Complexity: 1 Low  Lesle Chris, OTR/L Acute Rehabilitation Services [phone] [facility] pager [phone] office 08/03/2019  [place] 08/03/2019, 11:10 AM
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
## 2019-08-03 NOTE — Progress Notes (Signed)
PROGRESS NOTE    Brad [name]  LFY:[id number] DOB: 18-Jan-1961 DOA: 08/01/2019 PCP: Charlott Rakes, MD    Brief Narrative: 58 year old NICM, chronic combined systolic and diastolic CHF, cocaine abuse, heroin abuse, hep C, atrial flutter on eliquis, DM alcohol abuse, recently discharged on 9 th and readmitted on the 10th for sob and chest pain. Pt reports he did not take his diuretics on discharge. CXR on admission showed pulm edema and lower ext edema. He was admitted for acute on chronic combined systolic and diastolic CHF due to non compliance to diuretics.     Assessment & Plan:   Principal Problem:   Acute on chronic combined systolic and diastolic CHF (congestive heart failure) (HCC) Active Problems:   Chronic hepatitis C with cirrhosis (HCC)   DM type 2 (diabetes mellitus, type 2) (HCC)   CKD (chronic kidney disease) stage 3, GFR 30-59 ml/min (HCC)   Cocaine abuse (HCC)   Atrial flutter (HCC)   Chronic hyponatremia   Hyponatremia   Alcohol abuse   Elevated troponin   Chest pain   Acute systolic CHF (congestive heart failure) (HCC)   Acute on chronic systolic and diastolic CHF:  Improving, but pt says he is not back to baseline yet. He continues to have some sob and chest pressure. He had two episodes of NSVT about 7 to 8 beats today.  Will continue to diurese him with IV lasix 40 mg BID and replace K to keep it greater than 4 and magnesium greater than 2.  Ordered IV magnesium 2 g.  EKG done this am, does not show any ischemic changes, it shows sinus rhythm with 1 degree AV block and several PVC'S.  Unfortunately there are no accurate I/O's . Plan to check his weight today.  Echocardiogram showed LVef on 30 to 35% in June 2020. Troponin flat.    Hypokalemia: replaced, keep it greater than 4.     Polysubstance abuse with cocaine and heroin and alcohol.  Pt reports last cocaine use was 3 months ago and last heroin was 2 years ago.  No withdrawal symptoms.     Stage 3 CKD:  Creatinine at baseline.     H/o hepatitis C and cirrhosis:  No signs of ascites at this time or abdominal discomfort.    Type 2 DM:  CBG (last 3)  Recent Labs    08/02/19 2056 08/03/19 0730 08/03/19 1145  GLUCAP 176* 202* 188*   Resume SSI. No change in medications at this time.     Atrial flutter Currently in sinus, on cardizem and apixiban for anti coagulation.     Mild Hyponatremia on admission:  Resolved.        DVT prophylaxis:eliquis.  Code Status: Full code.  Family Communication: none at bedside.  Disposition Plan: possible d/c in am on oral torsemide if no chest pressure or sob.   Consultants:   None.   Procedures: None.   Antimicrobials: None.    Subjective: Reports chest pressure since this am. Requesting for pain meds.  Some sob on lying down.    Objective: Vitals:   08/02/19 2326 08/03/19 0045 08/03/19 0458 08/03/19 1212  BP: (!) 139/103  (!) 142/98 (!) 137/98  Pulse: (!) 101  90 98  Resp: (!) 24 20 16 16   Temp: 98.4 F (36.9 C)  98.1 F (36.7 C) (!) 97.5 F (36.4 C)  TempSrc: Oral  Oral Oral  SpO2: 98% 98% 94% 97%  Weight:   76.5 kg   Height:  Intake/Output Summary (Last 24 hours) at 08/03/2019 1457 Last data filed at 08/03/2019 1314 Gross per 24 hour  Intake 1020 ml  Output -  Net 1020 ml   Filed Weights   08/01/19 1731 08/02/19 0428 08/03/19 0458  Weight: 77.8 kg 76.9 kg 76.5 kg    Examination:  General exam: uncomfortable.  Respiratory system: Clear to auscultation. Respiratory effort normal. Cardiovascular system: S1 & S2 heard, RRR. No JVD, trace leg edema Gastrointestinal system: Abdomen is nondistended, soft and nontender. No organomegaly or masses felt. Normal bowel sounds heard. Central nervous system: Alert and oriented. No focal neurological deficits. Extremities: trace edema and slightly tender.  Skin: No rashes, lesions or ulcers Psychiatry: Mood & affect appropriate.     Data  Reviewed: I have personally reviewed following labs and imaging studies  CBC: Recent Labs  Lab 07/28/19 1607 07/30/19 0232 07/31/19 0319 08/01/19 1349 08/03/19 0523  WBC 4.8 5.2 3.9* 4.8 5.0  NEUTROABS  --   --  2.7  --  3.5  HGB 13.8 13.9 12.6* 14.5 13.9  HCT 41.1 41.3 36.7* 43.0 42.4  MCV 93.4 92.8 93.4 94.7 96.4  PLT 242 219 187 257 856   Basic Metabolic Panel: Recent Labs  Lab 07/28/19 1947 07/29/19 1807 07/30/19 0232 07/31/19 0319 08/01/19 1349 08/02/19 0409 08/03/19 0523  NA  --  126* 129* 132* 134* 137 136  K  --  3.5 3.7 3.9 4.0 3.1* 3.9  CL  --  91* 90* 94* 94* 99 100  CO2  --  25 28 26 29 28 27   GLUCOSE  --  139* 161* 241* 161* 148* 152*  BUN  --  18 20 25* 23* 23* 26*  CREATININE  --  1.31* 1.41* 1.46* 1.55* 1.32* 1.46*  CALCIUM  --  7.9* 8.5* 8.2* 8.7* 8.2* 8.4*  MG 1.3* 2.3 2.0 1.7  --   --  1.7  PHOS  --   --   --   --   --   --  2.8   GFR: Estimated Creatinine Clearance: 58 mL/min (A) (by C-G formula based on SCr of 1.46 mg/dL (H)). Liver Function Tests: Recent Labs  Lab 07/28/19 1947 07/31/19 0319 08/03/19 0523  AST 52* 27  --   ALT 45* 28  --   ALKPHOS 89 77  --   BILITOT 0.8 0.5  --   PROT 7.5 5.8*  --   ALBUMIN 2.8* 2.3* 2.7*   Recent Labs  Lab 07/28/19 1947  LIPASE 36   No results for input(s): AMMONIA in the last 168 hours. Coagulation Profile: No results for input(s): INR, PROTIME in the last 168 hours. Cardiac Enzymes: No results for input(s): CKTOTAL, CKMB, CKMBINDEX, TROPONINI in the last 168 hours. BNP (last 3 results) No results for input(s): PROBNP in the last 8760 hours. HbA1C: No results for input(s): HGBA1C in the last 72 hours. CBG: Recent Labs  Lab 08/02/19 1136 08/02/19 1639 08/02/19 2056 08/03/19 0730 08/03/19 1145  GLUCAP 152* 159* 176* 202* 188*   Lipid Profile: No results for input(s): CHOL, HDL, LDLCALC, TRIG, CHOLHDL, LDLDIRECT in the last 72 hours. Thyroid Function Tests: No results for input(s):  TSH, T4TOTAL, FREET4, T3FREE, THYROIDAB in the last 72 hours. Anemia Panel: No results for input(s): VITAMINB12, FOLATE, FERRITIN, TIBC, IRON, RETICCTPCT in the last 72 hours. Sepsis Labs: No results for input(s): PROCALCITON, LATICACIDVEN in the last 168 hours.  Recent Results (from the past 240 hour(s))  C difficile quick scan w PCR  reflex     Status: None   Collection Time: 07/29/19  3:55 AM   Specimen: STOOL  Result Value Ref Range Status   C Diff antigen NEGATIVE NEGATIVE Final   C Diff toxin NEGATIVE NEGATIVE Final   C Diff interpretation No C. difficile detected.  Final    Comment: Performed at [facility], [place] [address] [place], [place] [address]  Gastrointestinal Panel by PCR , Stool     Status: None   Collection Time: 07/29/19  3:55 AM   Specimen: STOOL  Result Value Ref Range Status   Campylobacter species NOT DETECTED NOT DETECTED Final   Plesimonas shigelloides NOT DETECTED NOT DETECTED Final   Salmonella species NOT DETECTED NOT DETECTED Final   Yersinia enterocolitica NOT DETECTED NOT DETECTED Final   Vibrio species NOT DETECTED NOT DETECTED Final   Vibrio cholerae NOT DETECTED NOT DETECTED Final   Enteroaggregative E coli (EAEC) NOT DETECTED NOT DETECTED Final   Enteropathogenic E coli (EPEC) NOT DETECTED NOT DETECTED Final   Enterotoxigenic E coli (ETEC) NOT DETECTED NOT DETECTED Final   Shiga like toxin producing E coli (STEC) NOT DETECTED NOT DETECTED Final   Shigella/Enteroinvasive E coli (EIEC) NOT DETECTED NOT DETECTED Final   Cryptosporidium NOT DETECTED NOT DETECTED Final   Cyclospora cayetanensis NOT DETECTED NOT DETECTED Final   Entamoeba histolytica NOT DETECTED NOT DETECTED Final   Giardia lamblia NOT DETECTED NOT DETECTED Final   Adenovirus F40/41 NOT DETECTED NOT DETECTED Final   Astrovirus NOT DETECTED NOT DETECTED Final   Norovirus GI/GII NOT DETECTED NOT DETECTED Final   Rotavirus A NOT DETECTED NOT DETECTED Final   Sapovirus (I, II, IV,  and V) NOT DETECTED NOT DETECTED Final    Comment: Performed at [facility], [address] [place], [place] [address]  SARS CORONAVIRUS 2 Nasal Swab Aptima Multi Swab     Status: None   Collection Time: 07/29/19  7:41 AM   Specimen: Aptima Multi Swab; Nasal Swab  Result Value Ref Range Status   SARS Coronavirus 2 NEGATIVE NEGATIVE Final    Comment: (NOTE) SARS-CoV-2 target nucleic acids are NOT DETECTED. The SARS-CoV-2 RNA is generally detectable in upper and lower respiratory specimens during the acute phase of infection. Negative results do not preclude SARS-CoV-2 infection, do not rule out co-infections with other pathogens, and should not be used as the sole basis for treatment or other patient management decisions. Negative results must be combined with clinical observations, patient history, and epidemiological information. The expected result is Negative. Fact Sheet for Patients: SugarRoll.be Fact Sheet for Healthcare Providers: https://www.woods-mathews.com/ This test is not yet approved or cleared by the [place] FDA and  has been authorized for detection and/or diagnosis of SARS-CoV-2 by FDA under an Emergency Use Authorization (EUA). This EUA will remain  in effect (meaning this test can be used) for the duration of the COVID-19 declaration under Section 56 4(b)(1) of the Act, 21 U.S.C. section 360bbb-3(b)(1), unless the authorization is terminated or revoked sooner. Performed at [facility], [place] [address] [place], [place] [address]          Radiology Studies: Dg Chest [facility] 1 View  Result Date: 08/03/2019 CLINICAL DATA:  CHF, tachypnea, shortness of breath EXAM: PORTABLE CHEST 1 VIEW COMPARISON:  08/01/2019 FINDINGS: Cardiomegaly. No frank interstitial edema. No pleural effusion or pneumothorax. IMPRESSION: Cardiomegaly.  No frank interstitial edema. Electronically Signed   By: Julian Hy  M.D.   On: 08/03/2019 00:29  Scheduled Meds: . apixaban  5 mg Oral BID  . aspirin EC  81 mg Oral Daily  . atorvastatin  40 mg Oral q1800  . diltiazem  30 mg Oral BID  . folic acid  1 mg Oral Daily  . furosemide  40 mg Intravenous Q12H  . gabapentin  600 mg Oral BID  . insulin aspart  0-5 Units Subcutaneous QHS  . insulin aspart  0-9 Units Subcutaneous TID WC  . multivitamin with minerals  1 tablet Oral Daily  . pantoprazole  40 mg Oral Daily  . potassium chloride  40 mEq Oral BID  . sodium chloride flush  3 mL Intravenous Q12H  . thiamine  100 mg Oral Daily   Or  . thiamine  100 mg Intravenous Daily   Continuous Infusions: . sodium chloride    . magnesium sulfate bolus IVPB       LOS: 1 day    Time spent: 38 minutes.     Hosie Poisson, MD [facility] Pager [phone]  If 7PM-7AM, please contact night-coverage [email or web address] Password [facility] 08/03/2019, 2:57 PM

## 2019-08-04 ENCOUNTER — Inpatient Hospital Stay: Payer: Medicaid Other | Admitting: Family Medicine

## 2019-08-04 ENCOUNTER — Ambulatory Visit: Payer: Medicaid Other | Admitting: Internal Medicine

## 2019-08-04 ENCOUNTER — Telehealth: Payer: Self-pay

## 2019-08-04 LAB — BASIC METABOLIC PANEL
Anion gap: 9 (ref 5–15)
BUN: 24 mg/dL — ABNORMAL HIGH (ref 6–20)
CO2: 23 mmol/L (ref 22–32)
Calcium: 8.1 mg/dL — ABNORMAL LOW (ref 8.9–10.3)
Chloride: 98 mmol/L (ref 98–111)
Creatinine, Ser: 1.32 mg/dL — ABNORMAL HIGH (ref 0.61–1.24)
GFR calc Af Amer: >60 mL/min (ref >60)
GFR calc non Af Amer: 59 mL/min — ABNORMAL LOW (ref >60)
Glucose, Bld: 190 mg/dL — ABNORMAL HIGH (ref 70–99)
Potassium: 3.7 mmol/L (ref 3.5–5.1)
Sodium: 130 mmol/L — ABNORMAL LOW (ref 135–145)

## 2019-08-04 LAB — GLUCOSE, CAPILLARY
Glucose-Capillary: 150 mg/dL — ABNORMAL HIGH (ref 70–99)
Glucose-Capillary: 141 mg/dL — ABNORMAL HIGH (ref 70–99)

## 2019-08-04 LAB — MAGNESIUM: Magnesium: 1.8 mg/dL (ref 1.7–2.4)

## 2019-08-04 MED ORDER — FOLIC ACID 1 MG PO TABS: 6.0000 mg | ORAL_TABLET | Freq: Every day | ORAL | 1 refills | Status: DC

## 2019-08-04 MED ORDER — TRAMADOL HCL 50 MG PO TABS: 50.0000 mg | ORAL_TABLET | Freq: Three times a day (TID) | ORAL | 0 refills | Status: DC | PRN

## 2019-08-04 MED ORDER — TORSEMIDE 20 MG PO TABS: 60.0000 mg | ORAL_TABLET | Freq: Two times a day (BID) | ORAL | 2 refills | Status: DC

## 2019-08-04 MED ORDER — GABAPENTIN 300 MG PO CAPS: 600.0000 mg | ORAL_CAPSULE | Freq: Two times a day (BID) | ORAL | 3 refills | Status: DC

## 2019-08-04 MED ORDER — DILTIAZEM HCL 30 MG PO TABS: 30.0000 mg | ORAL_TABLET | Freq: Two times a day (BID) | ORAL | 3 refills | Status: DC

## 2019-08-04 NOTE — Progress Notes (Signed)
Patient discharged to home, discharge instructions reviewed with patient who verbalized understanding.  

## 2019-08-04 NOTE — Telephone Encounter (Signed)
Call received from patient stating that he is being discharged today and has nowhere to go.  He did hear from his sister and she is sending him another key to his mother's house. This key is being sent to his friend's home instead of the [facility]. He has no idea when it will arrive. Until then he has no friends/ family/shetler to go to.   He has not heard from his friend, [name], who he had been staying with prior to this hospitalization. [name] has all of patient's medications/glucometer  and clothes. The patient has no medications and will have difficulty having prescriptions filled that have been recently filled as the insurance will not allow the refill.   This information was shared with Marney Doctor, RN CM

## 2019-08-08 ENCOUNTER — Telehealth (HOSPITAL_COMMUNITY): Payer: Self-pay

## 2019-08-08 ENCOUNTER — Telehealth: Payer: Self-pay

## 2019-08-08 ENCOUNTER — Ambulatory Visit: Payer: Medicaid Other | Admitting: Family Medicine

## 2019-08-08 ENCOUNTER — Encounter: Payer: Self-pay | Admitting: Family Medicine

## 2019-08-08 NOTE — Telephone Encounter (Signed)
  Received multiple voicemail's from Brad [name] over the weekend with him stating, "I am giving up, I am tired of feeling like this. I have no where to go or no one to talk to."   Left message for [name] to return my call. Will continue to reach out throughout the day.

## 2019-08-08 NOTE — Telephone Encounter (Signed)
Spoke to [place] about resources for housing and [email or web address] provided information about [employer]. Brad [name] states he will call and follow up with me on outcome.

## 2019-08-08 NOTE — Telephone Encounter (Signed)
Call placed to patient after speaking to [name], EMT.  Patient missed his appointment at [facility] this morning.  He explained that he is currently homeless and has no money. He has not heard from his sisters or his children after reaching out to them.  He explained that he is willing to go to [facility] or [facility] but would prefer to participate in an addiction treatment program  - [facility] # [phone]. Informed him that this CM would call to inquire more about that program.    Call placed to [facility], spoke to Colton who explained that they have a 12 step recovery program and they currently  have housing available in [place]. The individuals are placed in apartments with roommates and are required to attend meetings/support groups and work.  They need to work 40 hours/week and are required to pay rent - $195/week and purchase their own food. The organization provides needed transportation. Caryl Pina stated that the individual needs to be detoxed and if they are not able to work, they may need to be sent to detox.  She said that the patient will need to call to discuss his current needs/situation. She also noted that there is no charge to begin the program if the individual has 2 forms of ID  This information was shared with the patient and he said that he would call.  He does not feel that he needs to go to [facility] or [facility] and would like to go to [facility].    Call received from the patient. He said that he called [facility] and would like to participate in the program.  He said that he was told that the program can take him on Friday but he needs to be off the gabapentin.  He is inquiring if there is something else he can take. Informed him that Dr Margarita Rana would be notified.

## 2019-08-09 MED ORDER — DULOXETINE HCL 60 MG PO CPEP: 60.0000 mg | ORAL_CAPSULE | Freq: Every day | ORAL | 3 refills | Status: DC

## 2019-08-09 NOTE — Telephone Encounter (Signed)
I have placed him on Cymbalta which he can take in place of gabapentin.

## 2019-08-10 ENCOUNTER — Encounter (HOSPITAL_COMMUNITY): Payer: Self-pay

## 2019-08-10 ENCOUNTER — Other Ambulatory Visit: Payer: Self-pay

## 2019-08-10 ENCOUNTER — Emergency Department (HOSPITAL_COMMUNITY): Payer: Medicaid Other

## 2019-08-10 ENCOUNTER — Emergency Department (HOSPITAL_COMMUNITY)
Admission: EM | Admit: 2019-08-10 | Discharge: 2019-08-10 | Disposition: A | Discharge status: Home / Self Care | Payer: Medicaid Other | Attending: Emergency Medicine | Admitting: Emergency Medicine

## 2019-08-10 DIAGNOSIS — N183 Chronic kidney disease, stage 3 (moderate): Secondary | ICD-10-CM | POA: Diagnosis not present

## 2019-08-10 DIAGNOSIS — Z794 Long term (current) use of insulin: Secondary | ICD-10-CM | POA: Insufficient documentation

## 2019-08-10 DIAGNOSIS — Z79899 Other long term (current) drug therapy: Secondary | ICD-10-CM | POA: Insufficient documentation

## 2019-08-10 DIAGNOSIS — E1122 Type 2 diabetes mellitus with diabetic chronic kidney disease: Secondary | ICD-10-CM | POA: Diagnosis not present

## 2019-08-10 DIAGNOSIS — R079 Chest pain, unspecified: Secondary | ICD-10-CM

## 2019-08-10 DIAGNOSIS — R0789 Other chest pain: Secondary | ICD-10-CM | POA: Insufficient documentation

## 2019-08-10 DIAGNOSIS — I5042 Chronic combined systolic (congestive) and diastolic (congestive) heart failure: Secondary | ICD-10-CM | POA: Diagnosis not present

## 2019-08-10 DIAGNOSIS — I4891 Unspecified atrial fibrillation: Secondary | ICD-10-CM | POA: Insufficient documentation

## 2019-08-10 DIAGNOSIS — Z7901 Long term (current) use of anticoagulants: Secondary | ICD-10-CM | POA: Diagnosis not present

## 2019-08-10 DIAGNOSIS — F17210 Nicotine dependence, cigarettes, uncomplicated: Secondary | ICD-10-CM | POA: Diagnosis not present

## 2019-08-10 DIAGNOSIS — I130 Hypertensive heart and chronic kidney disease with heart failure and stage 1 through stage 4 chronic kidney disease, or unspecified chronic kidney disease: Secondary | ICD-10-CM | POA: Diagnosis not present

## 2019-08-10 LAB — BASIC METABOLIC PANEL
Anion gap: 12 (ref 5–15)
BUN: 17 mg/dL (ref 6–20)
CO2: 23 mmol/L (ref 22–32)
Calcium: 8.4 mg/dL — ABNORMAL LOW (ref 8.9–10.3)
Chloride: 96 mmol/L — ABNORMAL LOW (ref 98–111)
Creatinine, Ser: 1.41 mg/dL — ABNORMAL HIGH (ref 0.61–1.24)
GFR calc Af Amer: >60 mL/min (ref >60)
GFR calc non Af Amer: 54 mL/min — ABNORMAL LOW (ref >60)
Glucose, Bld: 175 mg/dL — ABNORMAL HIGH (ref 70–99)
Potassium: 3.1 mmol/L — ABNORMAL LOW (ref 3.5–5.1)
Sodium: 131 mmol/L — ABNORMAL LOW (ref 135–145)

## 2019-08-10 LAB — CBC
HCT: 42.6 % (ref 39.0–52.0)
Hemoglobin: 14.7 g/dL (ref 13.0–17.0)
MCH: 31.1 pg (ref 26.0–34.0)
MCHC: 34.5 g/dL (ref 30.0–36.0)
MCV: 90.1 fL (ref 80.0–100.0)
Platelets: 279 10*3/uL (ref 150–400)
RBC: 4.73 MIL/uL (ref 4.22–5.81)
RDW: 13 % (ref 11.5–15.5)
WBC: 4.5 10*3/uL (ref 4.0–10.5)
nRBC: 0 % (ref 0.0–0.2)

## 2019-08-10 LAB — BRAIN NATRIURETIC PEPTIDE: B Natriuretic Peptide: 780.2 pg/mL — ABNORMAL HIGH (ref 0.0–100.0)

## 2019-08-10 LAB — TROPONIN I (HIGH SENSITIVITY)
Troponin I (High Sensitivity): 35 ng/L — ABNORMAL HIGH (ref <18)
Troponin I (High Sensitivity): 35 ng/L — ABNORMAL HIGH (ref <18)

## 2019-08-10 LAB — RAPID URINE DRUG SCREEN, HOSP PERFORMED
Amphetamines: NOT DETECTED
Barbiturates: NOT DETECTED
Benzodiazepines: NOT DETECTED
Cocaine: NOT DETECTED
Opiates: NOT DETECTED
Tetrahydrocannabinol: NOT DETECTED

## 2019-08-10 MED ORDER — POTASSIUM CHLORIDE CRYS ER 20 MEQ PO TBCR
40.0000 meq | EXTENDED_RELEASE_TABLET | Freq: Once | ORAL | Status: AC
  Administered 2019-08-10: 40 meq via ORAL
  Filled 2019-08-10: qty 2

## 2019-08-10 MED ORDER — NITROGLYCERIN 0.4 MG SL SUBL
0.4000 mg | SUBLINGUAL_TABLET | Freq: Once | SUBLINGUAL | Status: AC
  Administered 2019-08-10: 0.4 mg via SUBLINGUAL
  Filled 2019-08-10: qty 1

## 2019-08-10 MED ORDER — FUROSEMIDE 10 MG/ML IJ SOLN
60.0000 mg | Freq: Once | INTRAMUSCULAR | Status: AC
  Administered 2019-08-10: 60 mg via INTRAVENOUS
  Filled 2019-08-10: qty 6

## 2019-08-10 MED ORDER — MORPHINE SULFATE (PF) 4 MG/ML IV SOLN
4.0000 mg | Freq: Once | INTRAVENOUS | Status: AC
  Administered 2019-08-10: 4 mg via INTRAVENOUS
  Filled 2019-08-10: qty 1

## 2019-08-10 MED ORDER — SODIUM CHLORIDE 0.9% FLUSH
3.0000 mL | Freq: Once | INTRAVENOUS | Status: AC
  Administered 2019-08-10: 3 mL via INTRAVENOUS

## 2019-08-10 NOTE — ED Notes (Signed)
Patient verbalizes understanding of discharge instructions. Opportunity for questioning and answering were provided.  patient discharged from ED.  
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
## 2019-08-10 NOTE — ED Triage Notes (Signed)
Pt from home via ems, c/o central cp and nausea that began approx 40 min ago; pt also c/o LE swelling; pt given 324 ASA PTA and 1 SL nitro, pain went from 10/10 to 8/10; pt noted to be in and out of trigeminy with underlying afib; hx DM, CHF, afib, HTN, MI, hx cocaine and heroine use - denies recent use  170/120 HR 80-100 RR 24 98% RA

## 2019-08-10 NOTE — ED Notes (Addendum)
Patient had episode of vtach , MD aware ; Patient alert and oriented , no s/s of disrtress; no further orders received

## 2019-08-10 NOTE — Discharge Instructions (Addendum)
Today you were seen for chest pain. We did an EKG and lab tests to make sure you are not having a heart attack. We also took an X-ray of your chest to see if anything was abnormal. We did not find anything that would explain the pain you are having. It is important that you take your torsemide as prescribed and also try to eat low salt foods while avoiding a lot of fluids. This will help your heart to function better and can decrease the pain you are having.   Please return to the emergency room for severe pain, severe shortness of breath, fever, or any other concerning symptom.  Please call your primary care provider to schedule a followup appointment within the next couple of days

## 2019-08-10 NOTE — ED Notes (Signed)
Urine culture sent with urinalysis.  

## 2019-08-10 NOTE — Telephone Encounter (Signed)
Call placed to patient  # [phone] to inform him of the order for cymbalta.  Message left with call back requested to this CM.

## 2019-08-11 ENCOUNTER — Telehealth: Payer: Self-pay

## 2019-08-11 NOTE — Telephone Encounter (Signed)
Additional calls received from the patient. 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  You need to be able to lift 40 lbs to work in [employer]. If you can't find a job prior to the initial 30 days, then you are out of the housing. The patient does not believe he can do this- the warehouse work or find a job on his own.  He thinks that the best plan now is  to rent a room from  a friend - Ms Suzan Garibaldi and he had this CM speak to Ms [facility].  She said that she has explained the rules of the home to him and said " nothing is going to happen."  He knows that he needs to stay sober and drug free and she said that she will enforce that or  he will be out of housing if he his non compliant.   He stated that he hopes to be able to attend support groups again as he know that it is not easy for him to avoid drugs and alcohol.

## 2019-08-11 NOTE — Telephone Encounter (Signed)
Message received from patient and call returned. He explained that he stayed with a friend last night and is still hoping to have housing with the [employer] program tomorrow. Instructed him to contact the program today to determine the plan for tomorrow.  He said that he is sober and does not need detox at this time. 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  He said that he is determined to make this program successful.  This CM stressed the importance of adhering to his medicine regime and explained to him that Dr Margarita Rana sent a prescription for cymbalta to his pharmacy that he can take in lieu of the gabapentin. He said that he will need to contact [facility]. He understands that working will be difficult due to his medical conditions.

## 2019-08-11 NOTE — Telephone Encounter (Signed)
Call received from the patient.  He stated that he called [employer] about starting the program tomorrow.  They explained to him that they will not tell you where you are going to be living or working until 2 hours before you are supposed to be at the apartment. You have to get to the apartment within 2 hours of confirming that you are participating in the program.  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  He voiced concerns about finding his own job - something that could physically manage the work on a full time basis.  This CM also voiced concerns about him being able to manage a job due to his medical conditions  and stressed the importance of adhering to his medication regime and medical follow up. He will call the program tomorrow but is not sure that he will participate.    He has another option to stay with someone he just met that has a room he can rent for $250/month.  He said that he is really trying to stay sober and understands the challenge of living in the community in an unsupported environment but he is not sure that the [employer] program is right for him

## 2019-08-15 ENCOUNTER — Telehealth (HOSPITAL_COMMUNITY): Payer: Self-pay

## 2019-08-15 ENCOUNTER — Telehealth: Payer: Self-pay

## 2019-08-15 NOTE — Telephone Encounter (Signed)
Multiple calls received from the patient today. He explained that the woman who was going to provide him with housing, has no house for him.  She mentioned to him that she may have a rooming house available but the patient does not think that the rooming house is in an area that would support his need to remain sober and drug free.  He said that he and his friend, [name], plan to stay at a motel tonight if they have enough money.  The patient explained that he sold his food stamps in order to make money for the motel but the person took the food stamp card and never paid him.   This CM spoke to Epimenio Sarin, [facility] to inquire about shelter resources again.  She said that she would check with [facility] but there is no guarantee that they will accept the patient back. He did not comply with the recent plan to transfer from [place] testing motel to the [facility].    Call placed to [facility] # [phone] to inquire about housing option.  This CM was informed that they are not a shelter and do not provide monetary assistance for housing for individuals. The only provide supportive housing with referral from the [employer] and they can't provide this CM with the phone number for [facility].   Update provided to [name], EMT.

## 2019-08-15 NOTE — Telephone Encounter (Signed)
Spoke with Brad [name] who states he is homeless and is currently riding around with [name] in his truck to [place] to take someone to the store. I advised [name] to let me know when he returns to [place] so I could meet him today. He agreed to same. Call complete.

## 2019-08-16 NOTE — Telephone Encounter (Signed)
Multiple calls received from patient again today. He noted that he is really trying to stay sober and drug free but is it very difficult and he is getting tired but he knows that he is strong and can get through this uncertainty. He said that his probation officer called and he left a message for her to call back.  This CM encouraged him to also ask her about any possible housing resources even though he is no longer on probation.  He is aware of [employer] and said that he was involved with the program when he was released from prison. He explained that he is not able to utilize any of their resources any longer now that he is off probation.  He said that he has money to stay in a motel tonight.  His friend, [name], receives a check tomorrow and they hope to be able to afford an apartment after he( patient ) receives his check next week.  However, he understands that finding an apartment will take time.   This CM spoke to [employer] [facility] and she has not heard from [facility] about patient's eligibility to return.

## 2019-08-17 ENCOUNTER — Telehealth: Payer: Self-pay

## 2019-08-17 NOTE — Telephone Encounter (Signed)
Multiple calls received from patient today.  Explained to him that as per [facility], she has not received a response from [facility] regarding bed availability for him. He said that he plans to stay at a motel again tonight with his friend , [name].

## 2019-08-18 ENCOUNTER — Telehealth: Payer: Self-pay

## 2019-08-18 NOTE — Telephone Encounter (Signed)
Met with the patient when he was in the clinic today.  Provided him with listing of available apartments in his price range through [email or web address] he said that he plans to look at an available apartment tomorrow with his friend [name].
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
## 2019-08-21 ENCOUNTER — Emergency Department (HOSPITAL_COMMUNITY): Payer: Medicaid Other

## 2019-08-21 ENCOUNTER — Encounter (HOSPITAL_COMMUNITY): Payer: Self-pay | Admitting: *Deleted

## 2019-08-21 ENCOUNTER — Other Ambulatory Visit: Payer: Self-pay

## 2019-08-21 ENCOUNTER — Inpatient Hospital Stay (HOSPITAL_COMMUNITY)
Admission: EM | Admit: 2019-08-21 | Discharge: 2019-08-23 | DRG: 291 | Discharge status: Against Medical Advice | Payer: Medicaid Other | Attending: Internal Medicine | Admitting: Internal Medicine

## 2019-08-21 DIAGNOSIS — Z9111 Patient's noncompliance with dietary regimen: Secondary | ICD-10-CM

## 2019-08-21 DIAGNOSIS — F10129 Alcohol abuse with intoxication, unspecified: Secondary | ICD-10-CM | POA: Diagnosis present

## 2019-08-21 DIAGNOSIS — Z79891 Long term (current) use of opiate analgesic: Secondary | ICD-10-CM

## 2019-08-21 DIAGNOSIS — R079 Chest pain, unspecified: Secondary | ICD-10-CM | POA: Diagnosis present

## 2019-08-21 DIAGNOSIS — I5032 Chronic diastolic (congestive) heart failure: Secondary | ICD-10-CM | POA: Diagnosis present

## 2019-08-21 DIAGNOSIS — K7460 Unspecified cirrhosis of liver: Secondary | ICD-10-CM | POA: Diagnosis present

## 2019-08-21 DIAGNOSIS — I480 Paroxysmal atrial fibrillation: Secondary | ICD-10-CM | POA: Diagnosis present

## 2019-08-21 DIAGNOSIS — I130 Hypertensive heart and chronic kidney disease with heart failure and stage 1 through stage 4 chronic kidney disease, or unspecified chronic kidney disease: Principal | ICD-10-CM | POA: Diagnosis present

## 2019-08-21 DIAGNOSIS — K219 Gastro-esophageal reflux disease without esophagitis: Secondary | ICD-10-CM | POA: Diagnosis present

## 2019-08-21 DIAGNOSIS — F17200 Nicotine dependence, unspecified, uncomplicated: Secondary | ICD-10-CM | POA: Diagnosis present

## 2019-08-21 DIAGNOSIS — F329 Major depressive disorder, single episode, unspecified: Secondary | ICD-10-CM | POA: Diagnosis present

## 2019-08-21 DIAGNOSIS — E871 Hypo-osmolality and hyponatremia: Secondary | ICD-10-CM | POA: Diagnosis present

## 2019-08-21 DIAGNOSIS — E1140 Type 2 diabetes mellitus with diabetic neuropathy, unspecified: Secondary | ICD-10-CM | POA: Diagnosis present

## 2019-08-21 DIAGNOSIS — Z20828 Contact with and (suspected) exposure to other viral communicable diseases: Secondary | ICD-10-CM | POA: Diagnosis present

## 2019-08-21 DIAGNOSIS — E119 Type 2 diabetes mellitus without complications: Secondary | ICD-10-CM

## 2019-08-21 DIAGNOSIS — B182 Chronic viral hepatitis C: Secondary | ICD-10-CM | POA: Diagnosis present

## 2019-08-21 DIAGNOSIS — I428 Other cardiomyopathies: Secondary | ICD-10-CM | POA: Diagnosis present

## 2019-08-21 DIAGNOSIS — I2510 Atherosclerotic heart disease of native coronary artery without angina pectoris: Secondary | ICD-10-CM | POA: Diagnosis present

## 2019-08-21 DIAGNOSIS — Z7901 Long term (current) use of anticoagulants: Secondary | ICD-10-CM

## 2019-08-21 DIAGNOSIS — E7800 Pure hypercholesterolemia, unspecified: Secondary | ICD-10-CM | POA: Diagnosis present

## 2019-08-21 DIAGNOSIS — Z79899 Other long term (current) drug therapy: Secondary | ICD-10-CM

## 2019-08-21 DIAGNOSIS — Z5329 Procedure and treatment not carried out because of patient's decision for other reasons: Secondary | ICD-10-CM | POA: Diagnosis present

## 2019-08-21 DIAGNOSIS — E1122 Type 2 diabetes mellitus with diabetic chronic kidney disease: Secondary | ICD-10-CM | POA: Diagnosis present

## 2019-08-21 DIAGNOSIS — I5043 Acute on chronic combined systolic (congestive) and diastolic (congestive) heart failure: Secondary | ICD-10-CM | POA: Diagnosis present

## 2019-08-21 DIAGNOSIS — Z8673 Personal history of transient ischemic attack (TIA), and cerebral infarction without residual deficits: Secondary | ICD-10-CM

## 2019-08-21 DIAGNOSIS — Z8249 Family history of ischemic heart disease and other diseases of the circulatory system: Secondary | ICD-10-CM

## 2019-08-21 DIAGNOSIS — Y906 Blood alcohol level of 120-199 mg/100 ml: Secondary | ICD-10-CM | POA: Diagnosis present

## 2019-08-21 DIAGNOSIS — I10 Essential (primary) hypertension: Secondary | ICD-10-CM | POA: Diagnosis present

## 2019-08-21 DIAGNOSIS — Z8546 Personal history of malignant neoplasm of prostate: Secondary | ICD-10-CM

## 2019-08-21 DIAGNOSIS — N183 Chronic kidney disease, stage 3 (moderate): Secondary | ICD-10-CM | POA: Diagnosis present

## 2019-08-21 DIAGNOSIS — Z888 Allergy status to other drugs, medicaments and biological substances status: Secondary | ICD-10-CM

## 2019-08-21 LAB — BASIC METABOLIC PANEL
Anion gap: 14 (ref 5–15)
BUN: 17 mg/dL (ref 6–20)
CO2: 22 mmol/L (ref 22–32)
Calcium: 8 mg/dL — ABNORMAL LOW (ref 8.9–10.3)
Chloride: 80 mmol/L — ABNORMAL LOW (ref 98–111)
Creatinine, Ser: 1.27 mg/dL — ABNORMAL HIGH (ref 0.61–1.24)
GFR calc Af Amer: >60 mL/min (ref >60)
GFR calc non Af Amer: >60 mL/min (ref >60)
Glucose, Bld: 131 mg/dL — ABNORMAL HIGH (ref 70–99)
Potassium: 3.2 mmol/L — ABNORMAL LOW (ref 3.5–5.1)
Sodium: 116 mmol/L — CL (ref 135–145)

## 2019-08-21 LAB — CBC
HCT: 37.4 % — ABNORMAL LOW (ref 39.0–52.0)
Hemoglobin: 13.2 g/dL (ref 13.0–17.0)
MCH: 31.8 pg (ref 26.0–34.0)
MCHC: 35.3 g/dL (ref 30.0–36.0)
MCV: 90.1 fL (ref 80.0–100.0)
Platelets: 186 10*3/uL (ref 150–400)
RBC: 4.15 MIL/uL — ABNORMAL LOW (ref 4.22–5.81)
RDW: 12.9 % (ref 11.5–15.5)
WBC: 4.3 10*3/uL (ref 4.0–10.5)
nRBC: 0 % (ref 0.0–0.2)

## 2019-08-21 LAB — TROPONIN I (HIGH SENSITIVITY): Troponin I (High Sensitivity): 33 ng/L — ABNORMAL HIGH (ref <18)

## 2019-08-21 MED ORDER — SODIUM CHLORIDE 0.9% FLUSH
3.0000 mL | Freq: Once | INTRAVENOUS | Status: AC
  Administered 2019-08-22: 3 mL via INTRAVENOUS

## 2019-08-21 NOTE — ED Triage Notes (Signed)
PT reports that he has had central chest pain tonight that goes to his arm. Posterior neck pain. Some shortness of breath. Reports he is compliant with his medications.
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
## 2019-08-22 ENCOUNTER — Emergency Department (HOSPITAL_COMMUNITY): Payer: Medicaid Other

## 2019-08-22 ENCOUNTER — Inpatient Hospital Stay (HOSPITAL_COMMUNITY): Payer: Medicaid Other

## 2019-08-22 DIAGNOSIS — R079 Chest pain, unspecified: Secondary | ICD-10-CM | POA: Diagnosis not present

## 2019-08-22 DIAGNOSIS — F10129 Alcohol abuse with intoxication, unspecified: Secondary | ICD-10-CM | POA: Diagnosis present

## 2019-08-22 DIAGNOSIS — I480 Paroxysmal atrial fibrillation: Secondary | ICD-10-CM | POA: Diagnosis present

## 2019-08-22 DIAGNOSIS — F329 Major depressive disorder, single episode, unspecified: Secondary | ICD-10-CM | POA: Diagnosis present

## 2019-08-22 DIAGNOSIS — B182 Chronic viral hepatitis C: Secondary | ICD-10-CM | POA: Diagnosis present

## 2019-08-22 DIAGNOSIS — I428 Other cardiomyopathies: Secondary | ICD-10-CM | POA: Diagnosis present

## 2019-08-22 DIAGNOSIS — Y906 Blood alcohol level of 120-199 mg/100 ml: Secondary | ICD-10-CM | POA: Diagnosis present

## 2019-08-22 DIAGNOSIS — I130 Hypertensive heart and chronic kidney disease with heart failure and stage 1 through stage 4 chronic kidney disease, or unspecified chronic kidney disease: Secondary | ICD-10-CM | POA: Diagnosis present

## 2019-08-22 DIAGNOSIS — Z5329 Procedure and treatment not carried out because of patient's decision for other reasons: Secondary | ICD-10-CM | POA: Diagnosis present

## 2019-08-22 DIAGNOSIS — E871 Hypo-osmolality and hyponatremia: Secondary | ICD-10-CM | POA: Diagnosis present

## 2019-08-22 DIAGNOSIS — K7460 Unspecified cirrhosis of liver: Secondary | ICD-10-CM | POA: Diagnosis present

## 2019-08-22 DIAGNOSIS — Z8546 Personal history of malignant neoplasm of prostate: Secondary | ICD-10-CM | POA: Diagnosis not present

## 2019-08-22 DIAGNOSIS — E7800 Pure hypercholesterolemia, unspecified: Secondary | ICD-10-CM | POA: Diagnosis present

## 2019-08-22 DIAGNOSIS — Z8249 Family history of ischemic heart disease and other diseases of the circulatory system: Secondary | ICD-10-CM | POA: Diagnosis not present

## 2019-08-22 DIAGNOSIS — I361 Nonrheumatic tricuspid (valve) insufficiency: Secondary | ICD-10-CM

## 2019-08-22 DIAGNOSIS — I340 Nonrheumatic mitral (valve) insufficiency: Secondary | ICD-10-CM

## 2019-08-22 DIAGNOSIS — I5032 Chronic diastolic (congestive) heart failure: Secondary | ICD-10-CM | POA: Diagnosis not present

## 2019-08-22 DIAGNOSIS — Z79899 Other long term (current) drug therapy: Secondary | ICD-10-CM | POA: Diagnosis not present

## 2019-08-22 DIAGNOSIS — E119 Type 2 diabetes mellitus without complications: Secondary | ICD-10-CM

## 2019-08-22 DIAGNOSIS — N183 Chronic kidney disease, stage 3 (moderate): Secondary | ICD-10-CM | POA: Diagnosis present

## 2019-08-22 DIAGNOSIS — Z20828 Contact with and (suspected) exposure to other viral communicable diseases: Secondary | ICD-10-CM | POA: Diagnosis present

## 2019-08-22 DIAGNOSIS — Z888 Allergy status to other drugs, medicaments and biological substances status: Secondary | ICD-10-CM | POA: Diagnosis not present

## 2019-08-22 DIAGNOSIS — Z79891 Long term (current) use of opiate analgesic: Secondary | ICD-10-CM | POA: Diagnosis not present

## 2019-08-22 DIAGNOSIS — E1140 Type 2 diabetes mellitus with diabetic neuropathy, unspecified: Secondary | ICD-10-CM | POA: Diagnosis present

## 2019-08-22 DIAGNOSIS — I5043 Acute on chronic combined systolic (congestive) and diastolic (congestive) heart failure: Secondary | ICD-10-CM | POA: Diagnosis present

## 2019-08-22 DIAGNOSIS — K219 Gastro-esophageal reflux disease without esophagitis: Secondary | ICD-10-CM | POA: Diagnosis present

## 2019-08-22 DIAGNOSIS — E1122 Type 2 diabetes mellitus with diabetic chronic kidney disease: Secondary | ICD-10-CM | POA: Diagnosis present

## 2019-08-22 DIAGNOSIS — Z7901 Long term (current) use of anticoagulants: Secondary | ICD-10-CM | POA: Diagnosis not present

## 2019-08-22 DIAGNOSIS — F17200 Nicotine dependence, unspecified, uncomplicated: Secondary | ICD-10-CM | POA: Diagnosis present

## 2019-08-22 DIAGNOSIS — Z9111 Patient's noncompliance with dietary regimen: Secondary | ICD-10-CM | POA: Diagnosis not present

## 2019-08-22 LAB — BASIC METABOLIC PANEL
Anion gap: 13 (ref 5–15)
BUN: 15 mg/dL (ref 6–20)
CO2: 23 mmol/L (ref 22–32)
Calcium: 8 mg/dL — ABNORMAL LOW (ref 8.9–10.3)
Chloride: 84 mmol/L — ABNORMAL LOW (ref 98–111)
Creatinine, Ser: 1.25 mg/dL — ABNORMAL HIGH (ref 0.61–1.24)
GFR calc Af Amer: >60 mL/min (ref >60)
GFR calc non Af Amer: >60 mL/min (ref >60)
Glucose, Bld: 158 mg/dL — ABNORMAL HIGH (ref 70–99)
Potassium: 2.7 mmol/L — CL (ref 3.5–5.1)
Sodium: 120 mmol/L — ABNORMAL LOW (ref 135–145)

## 2019-08-22 LAB — SARS CORONAVIRUS 2 BY RT PCR (HOSPITAL ORDER, PERFORMED IN ~~LOC~~ HOSPITAL LAB): SARS Coronavirus 2: NEGATIVE

## 2019-08-22 LAB — LIPID PANEL
Cholesterol: 142 mg/dL (ref 0–200)
HDL: 48 mg/dL (ref >40)
LDL Cholesterol: 74 mg/dL (ref 0–99)
Total CHOL/HDL Ratio: 3 RATIO
Triglycerides: 98 mg/dL (ref <150)
VLDL: 20 mg/dL (ref 0–40)

## 2019-08-22 LAB — CBG MONITORING, ED
Glucose-Capillary: 153 mg/dL — ABNORMAL HIGH (ref 70–99)
Glucose-Capillary: 163 mg/dL — ABNORMAL HIGH (ref 70–99)
Glucose-Capillary: 139 mg/dL — ABNORMAL HIGH (ref 70–99)

## 2019-08-22 LAB — OSMOLALITY: Osmolality: 269 mOsm/kg — ABNORMAL LOW (ref 275–295)

## 2019-08-22 LAB — GLUCOSE, CAPILLARY
Glucose-Capillary: 134 mg/dL — ABNORMAL HIGH (ref 70–99)
Glucose-Capillary: 154 mg/dL — ABNORMAL HIGH (ref 70–99)

## 2019-08-22 LAB — D-DIMER, QUANTITATIVE: D-Dimer, Quant: 1.05 ug/mL-FEU — ABNORMAL HIGH (ref 0.00–0.50)

## 2019-08-22 LAB — SODIUM, URINE, RANDOM: Sodium, Ur: <10 mmol/L

## 2019-08-22 LAB — SODIUM
Sodium: 124 mmol/L — ABNORMAL LOW (ref 135–145)
Sodium: 125 mmol/L — ABNORMAL LOW (ref 135–145)
Sodium: 126 mmol/L — ABNORMAL LOW (ref 135–145)
Sodium: 130 mmol/L — ABNORMAL LOW (ref 135–145)

## 2019-08-22 LAB — ECHOCARDIOGRAM COMPLETE

## 2019-08-22 LAB — TROPONIN I (HIGH SENSITIVITY)
Troponin I (High Sensitivity): 30 ng/L — ABNORMAL HIGH (ref <18)
Troponin I (High Sensitivity): 28 ng/L — ABNORMAL HIGH (ref <18)

## 2019-08-22 LAB — TSH: TSH: 1.506 u[IU]/mL (ref 0.350–4.500)

## 2019-08-22 LAB — OSMOLALITY, URINE: Osmolality, Ur: 141 mOsm/kg — ABNORMAL LOW (ref 300–900)

## 2019-08-22 LAB — BRAIN NATRIURETIC PEPTIDE: B Natriuretic Peptide: 960.3 pg/mL — ABNORMAL HIGH (ref 0.0–100.0)

## 2019-08-22 LAB — CORTISOL: Cortisol, Plasma: 17.4 ug/dL

## 2019-08-22 LAB — ETHANOL: Alcohol, Ethyl (B): 138 mg/dL — ABNORMAL HIGH (ref <10)

## 2019-08-22 LAB — LIPASE, BLOOD: Lipase: 37 U/L (ref 11–51)

## 2019-08-22 MED ORDER — TORSEMIDE 20 MG PO TABS
60.0000 mg | ORAL_TABLET | Freq: Two times a day (BID) | ORAL | Status: DC
  Administered 2019-08-22 – 2019-08-23 (×3): 60 mg via ORAL
  Filled 2019-08-22 (×3): qty 3

## 2019-08-22 MED ORDER — VITAMIN B-1 100 MG PO TABS
100.0000 mg | ORAL_TABLET | Freq: Every day | ORAL | Status: DC
  Administered 2019-08-22 – 2019-08-23 (×2): 100 mg via ORAL
  Filled 2019-08-22 (×3): qty 1

## 2019-08-22 MED ORDER — SODIUM CHLORIDE 0.9 % IV BOLUS
250.0000 mL | Freq: Once | INTRAVENOUS | Status: AC
  Administered 2019-08-22: 250 mL via INTRAVENOUS

## 2019-08-22 MED ORDER — POTASSIUM CHLORIDE IN NACL 20-0.9 MEQ/L-% IV SOLN
INTRAVENOUS | Status: AC
  Filled 2019-08-22: qty 1000

## 2019-08-22 MED ORDER — TRAMADOL HCL 50 MG PO TABS
50.0000 mg | ORAL_TABLET | Freq: Three times a day (TID) | ORAL | Status: DC | PRN
  Administered 2019-08-22 – 2019-08-23 (×2): 50 mg via ORAL
  Filled 2019-08-22 (×3): qty 1

## 2019-08-22 MED ORDER — POTASSIUM CHLORIDE CRYS ER 20 MEQ PO TBCR
40.0000 meq | EXTENDED_RELEASE_TABLET | Freq: Once | ORAL | Status: AC
  Administered 2019-08-22: 40 meq via ORAL
  Filled 2019-08-22: qty 2

## 2019-08-22 MED ORDER — GABAPENTIN 300 MG PO CAPS
600.0000 mg | ORAL_CAPSULE | Freq: Two times a day (BID) | ORAL | Status: DC
  Administered 2019-08-22 – 2019-08-23 (×3): 600 mg via ORAL
  Filled 2019-08-22 (×3): qty 2

## 2019-08-22 MED ORDER — DULOXETINE HCL 60 MG PO CPEP
60.0000 mg | ORAL_CAPSULE | Freq: Every day | ORAL | Status: DC
  Administered 2019-08-22 – 2019-08-23 (×2): 60 mg via ORAL
  Filled 2019-08-22 (×2): qty 1

## 2019-08-22 MED ORDER — APIXABAN 5 MG PO TABS
5.0000 mg | ORAL_TABLET | Freq: Two times a day (BID) | ORAL | Status: DC
  Administered 2019-08-22 – 2019-08-23 (×3): 5 mg via ORAL
  Filled 2019-08-22 (×4): qty 1

## 2019-08-22 MED ORDER — FOLIC ACID 1 MG PO TABS
6.0000 mg | ORAL_TABLET | Freq: Every day | ORAL | Status: DC
  Administered 2019-08-22 – 2019-08-23 (×2): 6 mg via ORAL
  Filled 2019-08-22 (×2): qty 6

## 2019-08-22 MED ORDER — SODIUM CHLORIDE 0.9% FLUSH
3.0000 mL | Freq: Two times a day (BID) | INTRAVENOUS | Status: DC
  Administered 2019-08-22 – 2019-08-23 (×3): 3 mL via INTRAVENOUS

## 2019-08-22 MED ORDER — INSULIN ASPART 100 UNIT/ML ~~LOC~~ SOLN
0.0000 [IU] | SUBCUTANEOUS | Status: DC
  Administered 2019-08-22: 1 [IU] via SUBCUTANEOUS
  Administered 2019-08-22 (×2): 2 [IU] via SUBCUTANEOUS
  Administered 2019-08-22: 1 [IU] via SUBCUTANEOUS
  Administered 2019-08-23: 3 [IU] via SUBCUTANEOUS
  Administered 2019-08-23: 1 [IU] via SUBCUTANEOUS

## 2019-08-22 MED ORDER — ALBUTEROL SULFATE HFA 108 (90 BASE) MCG/ACT IN AERS
2.0000 | INHALATION_SPRAY | Freq: Four times a day (QID) | RESPIRATORY_TRACT | Status: DC | PRN
  Filled 2019-08-22: qty 6.7

## 2019-08-22 MED ORDER — ASPIRIN 81 MG PO CHEW: 324.0000 mg | CHEWABLE_TABLET | Freq: Once | ORAL | Status: DC

## 2019-08-22 MED ORDER — THIAMINE HCL 100 MG PO TABS: 100.0000 mg | ORAL_TABLET | Freq: Every day | ORAL | Status: DC

## 2019-08-22 MED ORDER — SODIUM CHLORIDE 0.9% FLUSH: 3.0000 mL | INTRAVENOUS | Status: DC | PRN

## 2019-08-22 MED ORDER — ACETAMINOPHEN 650 MG RE SUPP: 650.0000 mg | Freq: Four times a day (QID) | RECTAL | Status: DC | PRN

## 2019-08-22 MED ORDER — ALBUTEROL SULFATE (2.5 MG/3ML) 0.083% IN NEBU: 2.5000 mg | INHALATION_SOLUTION | Freq: Four times a day (QID) | RESPIRATORY_TRACT | Status: DC | PRN

## 2019-08-22 MED ORDER — POTASSIUM CHLORIDE CRYS ER 20 MEQ PO TBCR
20.0000 meq | EXTENDED_RELEASE_TABLET | Freq: Every day | ORAL | Status: DC
  Administered 2019-08-22: 20 meq via ORAL
  Filled 2019-08-22: qty 1

## 2019-08-22 MED ORDER — ADULT MULTIVITAMIN W/MINERALS CH
1.0000 | ORAL_TABLET | Freq: Every day | ORAL | Status: DC
  Administered 2019-08-22 – 2019-08-23 (×2): 1 via ORAL
  Filled 2019-08-22 (×2): qty 1

## 2019-08-22 MED ORDER — METHOCARBAMOL 500 MG PO TABS
500.0000 mg | ORAL_TABLET | Freq: Two times a day (BID) | ORAL | Status: DC
  Administered 2019-08-22 – 2019-08-23 (×3): 500 mg via ORAL
  Filled 2019-08-22 (×3): qty 1

## 2019-08-22 MED ORDER — ATORVASTATIN CALCIUM 40 MG PO TABS
40.0000 mg | ORAL_TABLET | Freq: Every day | ORAL | Status: DC
  Administered 2019-08-22 – 2019-08-23 (×2): 40 mg via ORAL
  Filled 2019-08-22 (×3): qty 1

## 2019-08-22 MED ORDER — DILTIAZEM HCL 60 MG PO TABS
30.0000 mg | ORAL_TABLET | Freq: Two times a day (BID) | ORAL | Status: DC
  Administered 2019-08-22 – 2019-08-23 (×3): 30 mg via ORAL
  Filled 2019-08-22 (×4): qty 1

## 2019-08-22 MED ORDER — FUROSEMIDE 10 MG/ML IJ SOLN: 40.0000 mg | Freq: Once | INTRAMUSCULAR | Status: DC

## 2019-08-22 MED ORDER — IOHEXOL 350 MG/ML SOLN
100.0000 mL | Freq: Once | INTRAVENOUS | Status: AC | PRN
  Administered 2019-08-22: 100 mL via INTRAVENOUS

## 2019-08-22 MED ORDER — ACETAMINOPHEN 325 MG PO TABS: 650.0000 mg | ORAL_TABLET | Freq: Four times a day (QID) | ORAL | Status: DC | PRN

## 2019-08-22 MED ORDER — SODIUM CHLORIDE 0.9 % IV SOLN: 250.0000 mL | INTRAVENOUS | Status: DC | PRN

## 2019-08-22 NOTE — Progress Notes (Signed)
Pt transferred to 4E21 from ED via stretcher. Pt transferred to bed. Pt given CHG bath. Tele applied, CCMD notified. Pt oriented to room, call bell and bed. Call bell within reach. VSS. Will continue to monitor.  Amanda Cockayne, RN
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
## 2019-08-22 NOTE — ED Provider Notes (Signed)
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 Orders    None       Joclyn Alsobrook, Annie Main, MD 08/22/19 [phone]

## 2019-08-22 NOTE — Progress Notes (Signed)
  Echocardiogram 2D Echocardiogram has been performed.  Brad [name] 08/22/2019, 1:37 PM

## 2019-08-22 NOTE — H&P (Signed)
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 urinalysis to exclude cystitis. 10. Aortic Atherosclerosis (ICD10-I70.0). 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 Electronically Signed   By: Lovena Le M.D.   On: 08/22/2019 04:50     ekg nsr at 80, right axis, 1st degree avb, poor R progression, no st-t changes c/w ischemia   Assessment &  Plan:    Principal Problem:   Chest pain Active Problems:   Chronic diastolic congestive heart failure (HCC)   Essential hypertension   DM type 2 (diabetes mellitus, type 2) (HCC)  Chest pain Tele Trop I  Check cardiac echo Consider cardiology consult in Am  Hyponatremia ? etoh  intoxication ? Chf/ torsemide Check serum osm, tsh, cortisol Check urine sodium, urine osm,  Hydrate with ns iv at 93m per hour Check bmp in 4 hours.   Chronic diastolic chf Pafib/ Flutter Cont Eliquis pharmacy to dose Cont Cardizem 363mpo bid Cont Kcl Cont Torsemide 6033mo qday  Dm2 fsbs q4h, ISS  Diabetic neuropathy Cont Gabapentin  Etoh intoxication, polysubstance abuse [place]  ? Depression Cont Cymbalta  Hepatitis C F/u as outpatient   DVT Prophylaxis-   Eliquis  AM Labs Ordered, also please review Full Orders  Family Communication: Admission, patients condition and plan of care including tests being ordered have been discussed with the patient who indicate understanding and agree with the plan and Code Status.  Code Status:  FULL CODE,   Admission status: Inpatient: Based on patients clinical presentation and evaluation of above clinical data, I have made determination that patient meets Inpatient criteria at this time.  Pt has Na 116, will require close monitoring,  High risk of clinical deterioration. Pt will require > 2 nites stay.    Time spent in minutes : 70   JamJani GravelD on 08/22/2019 at 5:15 AM

## 2019-08-22 NOTE — ED Notes (Signed)
Dr. Nevada Crane aware of 2.7 k+ , will be awaiting for further orders

## 2019-08-22 NOTE — ED Notes (Signed)
Pt sound asleep had to be awakened  And he says his pain is  A 10.10  He constantly grinds his teeth

## 2019-08-22 NOTE — Progress Notes (Signed)
Yoshiyuki Lomanto  is a 58 y.o. male,  nonischemic cardiomyopathy, chronic combined systolic and diastolic CHF, cocaine abuse, heroin abuse, hepatitis C, atrial flutter on Eliquis, diabetes mellitus type 2, alcohol abuse apparently presents to [facility] ED for chest pain  Substernal "pressure" with radiation to the left arm. Pt states started about 30 minutes prior to arrival to ED, and lasted for about 1 hour.  Pt notes slight dyspnea associated with sob.  Pt denies fever, chills, cough, palp, n/v, abd pain, diarrhea, brbpr, black stool.  Pt was just recently admitted for chest pain on 08/01/2019.    08/22/19: seen and examined in the ED. Reports pain in his chest and epigastric region reproducible on palpation.  Elevated BNP >900; elevated d-dimer 1.05>> CTA PE negative. Will obtain lipase level add on.  Na+ level 116>>120>>124 improving with fluid restriction. C/w Na+ q4H x 1 day.  Please refer to H&P from Dr. Maudie Mercury on 08/22/19 for further details of the assessment and plan.

## 2019-08-22 NOTE — ED Notes (Signed)
To ct

## 2019-08-23 ENCOUNTER — Other Ambulatory Visit: Payer: Self-pay | Admitting: Family Medicine

## 2019-08-23 LAB — CBC
HCT: 41.2 % (ref 39.0–52.0)
Hemoglobin: 14.1 g/dL (ref 13.0–17.0)
MCH: 31.1 pg (ref 26.0–34.0)
MCHC: 34.2 g/dL (ref 30.0–36.0)
MCV: 90.9 fL (ref 80.0–100.0)
Platelets: 198 10*3/uL (ref 150–400)
RBC: 4.53 MIL/uL (ref 4.22–5.81)
RDW: 13.1 % (ref 11.5–15.5)
WBC: 4.7 10*3/uL (ref 4.0–10.5)
nRBC: 0 % (ref 0.0–0.2)

## 2019-08-23 LAB — COMPREHENSIVE METABOLIC PANEL
ALT: 21 U/L (ref 0–44)
AST: 27 U/L (ref 15–41)
Albumin: 2.8 g/dL — ABNORMAL LOW (ref 3.5–5.0)
Alkaline Phosphatase: 84 U/L (ref 38–126)
Anion gap: 12 (ref 5–15)
BUN: 19 mg/dL (ref 6–20)
CO2: 25 mmol/L (ref 22–32)
Calcium: 8.4 mg/dL — ABNORMAL LOW (ref 8.9–10.3)
Chloride: 91 mmol/L — ABNORMAL LOW (ref 98–111)
Creatinine, Ser: 1.32 mg/dL — ABNORMAL HIGH (ref 0.61–1.24)
GFR calc Af Amer: >60 mL/min (ref >60)
GFR calc non Af Amer: 59 mL/min — ABNORMAL LOW (ref >60)
Glucose, Bld: 75 mg/dL (ref 70–99)
Potassium: 3.4 mmol/L — ABNORMAL LOW (ref 3.5–5.1)
Sodium: 128 mmol/L — ABNORMAL LOW (ref 135–145)
Total Bilirubin: 0.9 mg/dL (ref 0.3–1.2)
Total Protein: 7 g/dL (ref 6.5–8.1)

## 2019-08-23 LAB — GLUCOSE, CAPILLARY
Glucose-Capillary: 127 mg/dL — ABNORMAL HIGH (ref 70–99)
Glucose-Capillary: 209 mg/dL — ABNORMAL HIGH (ref 70–99)
Glucose-Capillary: 82 mg/dL (ref 70–99)

## 2019-08-23 LAB — SODIUM: Sodium: 127 mmol/L — ABNORMAL LOW (ref 135–145)

## 2019-08-23 IMAGING — DX DG CHEST 2V
2 series · 2 of 2 positions shown · non-contrast
Comparison: Prior chest x-ray 08/17/2018

CLINICAL DATA: 56-year-old male with dry cough, shortness of breath
and midline chest pain

EXAM:
CHEST - 2 VIEW

[chest lat]
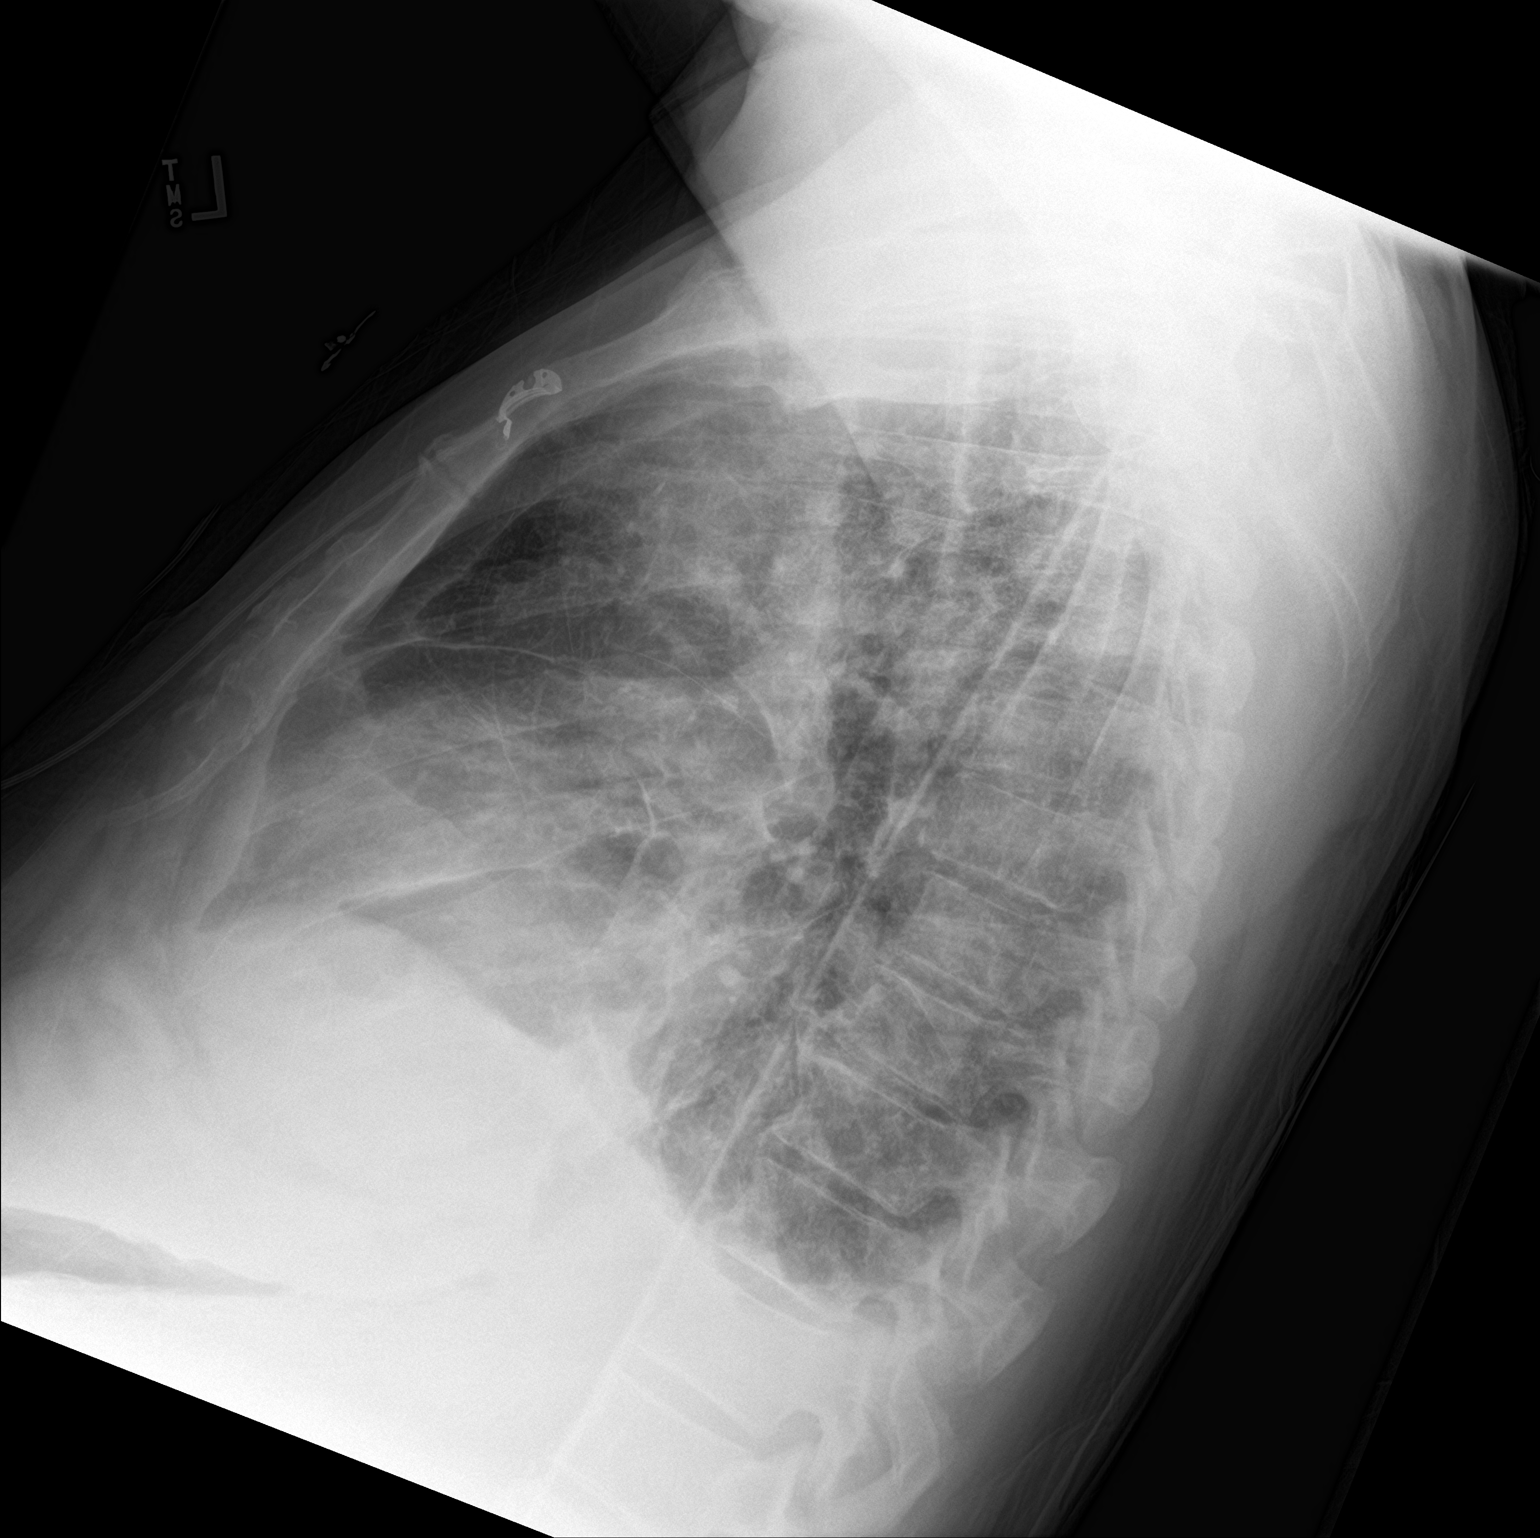

[chest ap]
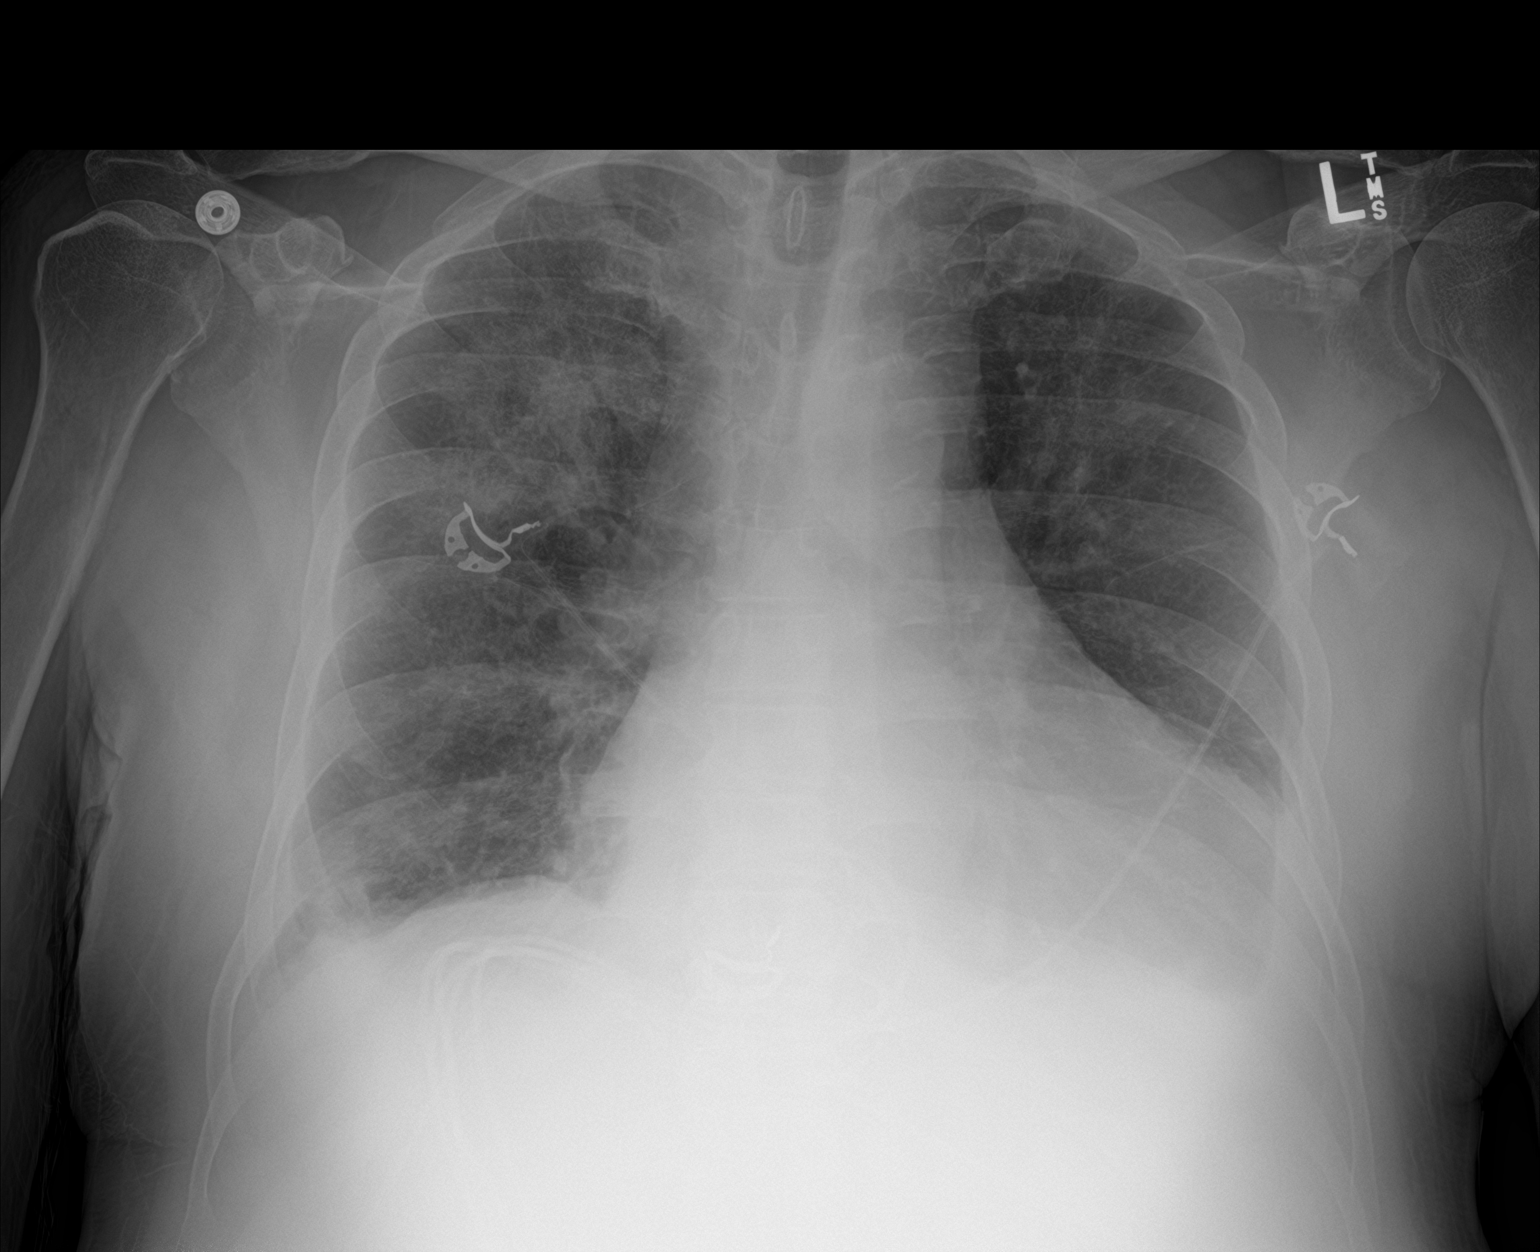

[2 of 2 positions shown; findings below may reference images not displayed]

FINDINGS: Overall significantly improved aeration with resolution of pulmonary
edema. However, there is persistent patchy airspace opacity in the
right upper lung which may represent pneumonia. Slightly improved
cardiomegaly. Stable mediastinal contours. Atherosclerotic
calcifications present in the transverse aorta. Small bilateral,
left larger than right, layering pleural effusions and associated
bibasilar atelectasis. No evidence of pneumothorax. No acute osseous
abnormality.
IMPRESSION: 1. Persistent patchy airspace opacity in the right upper lung
concerning for underlying bronchopneumonia.
2. Otherwise, significantly improved aeration bilaterally with
resolution of pulmonary edema.
3. Persistent small bilateral, left slightly larger than right,
layering pleural effusions and associated bibasilar atelectasis.

## 2019-08-23 MED ORDER — POTASSIUM CHLORIDE CRYS ER 20 MEQ PO TBCR
40.0000 meq | EXTENDED_RELEASE_TABLET | Freq: Every day | ORAL | Status: DC
  Administered 2019-08-23: 40 meq via ORAL
  Filled 2019-08-23: qty 2

## 2019-08-23 MED ORDER — KETOROLAC TROMETHAMINE 30 MG/ML IJ SOLN
30.0000 mg | Freq: Once | INTRAMUSCULAR | Status: AC
  Administered 2019-08-23: 30 mg via INTRAVENOUS
  Filled 2019-08-23: qty 1

## 2019-08-23 MED ORDER — POTASSIUM CHLORIDE CRYS ER 20 MEQ PO TBCR
40.0000 meq | EXTENDED_RELEASE_TABLET | Freq: Once | ORAL | Status: AC
  Administered 2019-08-23: 40 meq via ORAL
  Filled 2019-08-23: qty 2

## 2019-08-23 NOTE — Discharge Instructions (Signed)

## 2019-08-23 NOTE — Progress Notes (Signed)
Pt voiced to this RN that he wanted to leave. MD paged and made aware, pt to leave AMA. Pt educated on process of leaving against medical advice. Pt voiced understanding. Pt signed [place] paperwork. Pt walked off unit.  Amanda Cockayne, RN

## 2019-08-23 NOTE — Discharge Summary (Signed)
Discharge Summary  Brad [name] [id number] DOB: 04-15-1961  PCP: Charlott Rakes, MD  Admit date: 08/21/2019 Discharge date: 08/23/2019  Time spent: 35 minutes  Recommendations for Outpatient Follow-up:  1. Patient left AGAINST MEDICAL ADVICE.  Discharge Diagnoses:  Active Hospital Problems   Diagnosis Date Noted  . Chest pain 06/07/2019  . Essential hypertension 07/01/2016  . DM type 2 (diabetes mellitus, type 2) ([place]) 07/01/2016  . Chronic diastolic congestive heart failure ([place]) 01/31/2016    Resolved Hospital Problems  No resolved problems to display.    Vitals:   08/23/19 0325 08/23/19 0822  BP:  (!) 124/93  Pulse: (!) 134 82  Resp: (!) 24 14  Temp:  97.9 F (36.6 C)  SpO2: 100% 97%    History of present illness:  [name] a58 y.o.male,nonischemic cardiomyopathy, chronic combined systolic and diastolic CHF, cocaine abuse, heroin abuse, hepatitis C, atrial flutter on Eliquis, diabetes mellitus type 2, alcohol abuseapparently presents to [facility] ED for chest pain Substernal "pressure" with radiation to the left arm. Pt states started about 30 minutes prior to arrival to ED, and lasted for about 1 hour. Pt notes slight dyspnea associated with sob. Pt denies fever, chills, cough, palp, n/v, abd pain, diarrhea, brbpr, black stool. Pt was just recently admitted for chest pain on 08/01/2019.   08/22/19: seen and examined in the ED. Reports pain in his chest and epigastric region reproducible on palpation.  Elevated BNP >900; elevated d-dimer 1.05>> CTA PE negative. Will obtain lipase level add on.  Na+ level 116>>120>>124 improving with fluid restriction. C/w Na+ q4H x 1 day.  08/23/19: Patient was seen and examined at his bedside this morning.  States his 60 year old son was involved in a motor vehicle accident while driving to [facility].  States he is currently admitted to [facility] long hospital.  States he had to leave before 11 and left AMA.  Felt improved  after diuresing.  2D echo done on 08/22/2019 showed LVEF 25 to 30%.  Patient advised to see cardiology and stated that he will see his cardiologist outpatient.  Denies chest pain.   Hospital Course:  Principal Problem:   Chest pain Active Problems:   Chronic diastolic congestive heart failure (HCC)   Essential hypertension   DM type 2 (diabetes mellitus, type 2) (HCC)  Acute on chronic systolic CHF likely secondary to diet noncompliance and excessive alcohol intake Presented with fluid overload, dyspnea and chest discomfort Self-reports was not aware of fluid restriction Diuresed with improvement of symptoms Repeated 2D echo done on 08/22/2019 showed LVEF 25 to 30%.  Patient made aware of these findings and advised to see cardiology.  States he will see his cardiologist outpatient.  Patient left AGAINST MEDICAL ADVICE.  Hyponatremia likely secondary to beer [place] Presented with sodium 116 With fluid restriction sodium came up to 128 on 08/23/2019  Chronic alcohol abuse with intoxication Presented with alcohol level 138 Patient on CIWA protocol    Discharge Exam: BP (!) 124/93 (BP Location: Left Arm)   Pulse 82   Temp 97.9 F (36.6 C) (Oral)   Resp 14   Ht 5\' 11"  (1.803 m)   Wt 76.7 kg   SpO2 97%   BMI 23.57 kg/m  . General: 58 y.o. year-old male well developed well nourished in no acute distress.  Alert and oriented x3. . Cardiovascular: Regular rate and rhythm with no rubs or gallops.  No thyromegaly or JVD noted.   Marland Kitchen Respiratory: Mild rales at bases with no wheezes noted.  Poor inspiratory effort.   . Abdomen: Soft nontender nondistended with normal bowel sounds x4 quadrants. . Musculoskeletal: Trace lower extremity edema. 2/4 pulses in all 4 extremities. Marland Kitchen Psychiatry: Mood is appropriate for condition and setting  Discharge Instructions You were cared for by a hospitalist during your hospital stay. If you have any questions about your discharge medications or the  care you received while you were in the hospital after you are discharged, you can call the unit and asked to speak with the hospitalist on call if the hospitalist that took care of you is not available. Once you are discharged, your primary care physician will handle any further medical issues. Please note that NO REFILLS for any discharge medications will be authorized once you are discharged, as it is imperative that you return to your primary care physician (or establish a relationship with a primary care physician if you do not have one) for your aftercare needs so that they can reassess your need for medications and monitor your lab values.    Patient left AGAINST MEDICAL ADVICE.     The results of significant diagnostics from this hospitalization (including imaging, microbiology, ancillary and laboratory) are listed below for reference.    Significant Diagnostic Studies: Dg Chest 2 View  Result Date: 08/21/2019 CLINICAL DATA:  Chest pain. EXAM: CHEST - 2 VIEW COMPARISON:  Most recent radiograph 08/10/2019, multiple priors. Most recent CT 05/27/2019 FINDINGS: Chronic cardiomegaly with unchanged mediastinal contours. Trace bilateral pleural effusions, minimal fluid in the fissures. Minimal pulmonary edema with Kerley B-lines. No confluent airspace disease or pneumothorax. No acute osseous abnormalities. IMPRESSION: Minimal pulmonary edema with trace bilateral pleural effusions. Stable cardiomegaly. Electronically Signed   By: Keith Rake M.D.   On: 08/21/2019 23:13   Dg Chest 2 View  Result Date: 08/10/2019 CLINICAL DATA:  Chest pain EXAM: CHEST - 2 VIEW COMPARISON:  August 02, 2019 FINDINGS: There is atelectatic change in the left base. There is no edema or consolidation. There is cardiomegaly with pulmonary vascularity normal. No adenopathy. No bone lesions. IMPRESSION: Atelectasis left base. No edema or consolidation. Stable cardiomegaly. Pulmonary vascularity within normal limits. No  adenopathy. Electronically Signed   By: Lowella Grip III M.D.   On: 08/10/2019 09:48   Dg Chest 2 View  Result Date: 08/01/2019 CLINICAL DATA:  Shortness of breath. Cough. Upper and lower extremity swelling. EXAM: CHEST - 2 VIEW COMPARISON:  Two-view chest 07/28/2019 FINDINGS: Heart is enlarged. Mild pulmonary vascular congestion has increased. Bilateral pleural effusions are present. Bibasilar airspace disease is noted. IMPRESSION: 1. Cardiomegaly with pulmonary vascular congestion bilateral effusions suggesting congestive heart failure. 2. Increased bibasilar airspace disease likely reflects atelectasis. Infection is not excluded. Electronically Signed   By: San Morelle M.D.   On: 08/01/2019 12:51   Dg Chest 2 View  Result Date: 07/28/2019 CLINICAL DATA:  Chest pain. Shortness of breath. EXAM: CHEST - 2 VIEW COMPARISON:  07/17/2019 FINDINGS: The cardiac silhouette remains mildly enlarged. Mild opacity is present in both lung bases, new/increased from prior. There may be trace pleural effusions bilaterally. No pneumothorax is identified. No acute osseous abnormality is seen. IMPRESSION: Mild bibasilar opacities suggesting atelectasis. Possible trace pleural effusions. Electronically Signed   By: Logan Bores M.D.   On: 07/28/2019 16:45   Ct Head Wo Contrast  Result Date: 08/22/2019 CLINICAL DATA:  Altered level of consciousness, substance use EXAM: CT HEAD WITHOUT CONTRAST TECHNIQUE: Contiguous axial images were obtained from the base of the skull through the vertex  without intravenous contrast. COMPARISON:  CT head June 06, 2019 FINDINGS: Brain: Redemonstration of age advanced parenchymal atrophy and chronic microvascular changes. No evidence of acute infarction, hemorrhage, hydrocephalus, extra-axial collection or mass lesion/mass effect. Vascular: Atherosclerotic calcification of the carotid siphons. No hyperdense vessel or unexpected calcification. Skull: No calvarial fracture or  suspicious osseous lesion. No scalp swelling or hematoma. Stable right parietooccipital soft tissue infiltration, likely scarring. Sinuses/Orbits: Mild thickening in the left ethmoids. Remainder of the paranasal sinuses are predominantly clear. Mastoid air cells are well aerated. Debris noted in the external auditory canals. Included orbital structures are unremarkable. Other: None. IMPRESSION: *No acute intracranial abnormality. *Stable age advanced parenchymal atrophy and chronic microvascular ischemic changes. *Scarring of the right parieto-occipital scalp. Electronically Signed   By: Lovena Le M.D.   On: 08/22/2019 04:24   Ct Abdomen Pelvis W Contrast  Result Date: 07/29/2019 CLINICAL DATA:  Abdominal pain, shortness of breath. EXAM: CT ABDOMEN AND PELVIS WITH CONTRAST TECHNIQUE: Multidetector CT imaging of the abdomen and pelvis was performed using the standard protocol following bolus administration of intravenous contrast. CONTRAST:  188mL OMNIPAQUE IOHEXOL 300 MG/ML  SOLN COMPARISON:  08/16/2018 FINDINGS: Lower chest: Bilateral pleural effusions, right greater than left. Cardiomegaly. Hepatobiliary: Nodular contours of the liver compatible with cirrhosis. No focal hepatic abnormality or biliary ductal dilatation. Gallbladder grossly unremarkable. Pancreas: No focal abnormality or ductal dilatation. Spleen: No focal abnormality.  Normal size. Adrenals/Urinary Tract: No adrenal abnormality. No focal renal abnormality. No stones or hydronephrosis. Urinary bladder is unremarkable. Stomach/Bowel: Normal appendix. Stomach, large and small bowel grossly unremarkable. Scattered colonic diverticula. No active diverticulitis. Vascular/Lymphatic: Aortic atherosclerosis. No enlarged abdominal or pelvic lymph nodes. Reproductive: Mild prostate enlargement. Other: No free fluid or free air. Musculoskeletal: No acute bony abnormality. IMPRESSION: Small bilateral pleural effusions, right slightly larger than left.  This is similar to prior study. Cardiomegaly. Changes of cirrhosis. Aortic atherosclerosis. Scattered diverticulosis. Prostate enlargement. No acute findings. Electronically Signed   By: Rolm Baptise M.D.   On: 07/29/2019 03:01   Dg Chest Port 1 View  Result Date: 08/03/2019 CLINICAL DATA:  CHF, tachypnea, shortness of breath EXAM: PORTABLE CHEST 1 VIEW COMPARISON:  08/01/2019 FINDINGS: Cardiomegaly. No frank interstitial edema. No pleural effusion or pneumothorax. IMPRESSION: Cardiomegaly.  No frank interstitial edema. Electronically Signed   By: Julian Hy M.D.   On: 08/03/2019 00:29   Ct Angio Chest/abd/pel For Dissection W And/or Wo Contrast  Result Date: 08/22/2019 CLINICAL DATA:  Chest pain, acute aortic syndrome suspected EXAM: CT ANGIOGRAPHY CHEST, ABDOMEN AND PELVIS TECHNIQUE: Multidetector CT imaging through the chest, abdomen and pelvis was performed using the standard protocol during bolus administration of intravenous contrast. Multiplanar reconstructed images and MIPs were obtained and reviewed to evaluate the vascular anatomy. CONTRAST:  132mL OMNIPAQUE IOHEXOL 350 MG/ML SOLN COMPARISON:  Chest radiograph August 01, 2019, CT abdomen pelvis July 29, 2019 FINDINGS: CTA CHEST FINDINGS Cardiovascular: Noncontrast CT of the chest demonstrates no abnormal plaque displacement or hyperdense mural thickening to suggest intramural hemorrhage. Post-contrast CT imaging demonstrates suboptimal bolus timing which preferentially opacifies the pulmonary arteries rather than the thoracic aorta. Images do remain of diagnostic utility for evaluation of thoracic aortic injury. The aortic root is suboptimally assessed given cardiac pulsation artifact. Normal 3 vessel arch. Atherosclerotic plaque within the normal caliber aorta. No intramural hematoma, dissection flap or other luminal abnormality of the aorta is seen. No periaortic stranding or hemorrhage. Satisfactory opacification of the pulmonary  arteries to the segmental level. No central  or segmental pulmonary artery filling defects are identified. Pulmonary arteries are enlarged centrally but without evidence of right heart strain. There is cardiomegaly with biatrial enlargement and reflux of contrast into the hepatic veins suggesting right heart failure likely contributing to the suboptimal bolus timing. Mediastinum/Nodes: Few engorged but non pathologically enlarged mediastinal and hilar nodes are present. No enlarged axillary lymph nodes. Lungs/Pleura: Diffuse hazy ground-glass attenuation throughout the lungs with extensive interlobular septal thickening and peribronchovascular thickening. No focal consolidative process. Bilateral pleural effusions, trace in the left and small on the right. Musculoskeletal: Multilevel degenerative changes are present in the imaged portions of the spine. No acute osseous abnormality or suspicious osseous lesion. Review of the MIP images confirms the above findings. CTA ABDOMEN AND PELVIS FINDINGS VASCULAR Aorta: Atherosclerotic plaque within the normal caliber aorta. No evidence of dissection, vasculitis or significant stenosis. Celiac: Mild atheromatous narrowing of the ostium. No evidence of aneurysm, dissection or vasculitis. SMA: Plaque at the ostium without significant stenosis. No evidence of aneurysm, dissection or vasculitis. Renals: Single renal arteries bilaterally. Mild plaque at the ostia without most mild stenosis on the right. No evidence of aneurysm, dissection or vasculitis. No evidence of fibromuscular dysplasia. IMA: Patent without evidence of aneurysm, dissection, vasculitis or significant stenosis. Inflow: Atheromatous plaque throughout the inflow. No evidence of aneurysm, ectasias, dissection or vasculitis. Veins: Limited venous evaluation demonstrates some upper abdominal venous collateralization but no other overt abnormality. Review of the MIP images confirms the above findings. NON-VASCULAR  Hepatobiliary: Markedly nodular liver surface contour compatible with history of cirrhosis no visible hepatic lesions. Circumferential thickening of the gallbladder without visible calcified gallstones or biliary ductal dilatation. Pancreas: Unremarkable. No pancreatic ductal dilatation or surrounding inflammatory changes. Spleen: Normal in size without focal abnormality. Adrenals/Urinary Tract: Normal adrenal glands. Mild bilateral nonspecific perinephric stranding, a nonspecific finding though may correlate with either age or decreased renal function. Kidneys are otherwise unremarkable, without renal calculi, suspicious lesion, or hydronephrosis. Mild bladder wall thickening. Indentation of the posterior bladder wall by median hypertrophy of the enlarged prostate. Stomach/Bowel: Distal esophagus, stomach and duodenal sweep are unremarkable. No bowel wall thickening or dilatation. No evidence of obstruction. A normal appendix is visualized. Scattered colonic diverticula without focal pericolonic inflammation to suggest diverticulitis. Lymphatic: No suspicious or enlarged lymph nodes in the included lymphatic chains. Reproductive: Prostatomegaly with median lobe hypertrophy and indentation of the posterior bladder, as above. Subtle vesicles are symmetric. Other: Slight hazy increased attenuation of the mid mesentery, may reflect volume 3rd spacing. No abdominopelvic free fluid or free gas. No bowel containing hernias. Musculoskeletal: No acute osseous abnormality or suspicious osseous lesion. Mild degenerative changes are present in the imaged portions of the spine. Review of the MIP images confirms the above findings. IMPRESSION: 1. Suboptimal contrast bolus preferentially opacify the pulmonary arteries. Within the limitations of this exam there is no convincing CT evidence of acute aortic syndrome. 2. No evidence of central or segmental pulmonary artery embolism. 3. Cardiomegaly with biatrial enlargement and  reflux of contrast into the hepatic veins suggesting right heart failure likely contributing to the suboptimal bolus timing. 4. Diffuse hazy ground-glass attenuation throughout the lungs with extensive interlobular septal thickening and peribronchovascular thickening, consistent with pulmonary edema. 5. Bilateral pleural effusions, trace in the left and small on the right. 6. Markedly nodular liver surface contour compatible with history of cirrhosis. No visible hepatic lesions. 7. Circumferential thickening of the gallbladder wall without visible calcified gallstones or biliary ductal dilatation. Findings are nonspecific and may  be related to underlying liver disease. If there is clinical concern for acute cholecystitis, consider further evaluation with right upper quadrant ultrasound. 8. Hazy stranding in the mesentery as well as circumferential body wall edema likely reflective of volume third-spacing. 9. Prostatomegaly with median lobe hypertrophy and indentation of the posterior bladder wall. Mild bladder wall thickening is possibly related to chronic outlet obstruction. Recommend correlation with urinalysis to exclude cystitis. 10. Aortic Atherosclerosis (ICD10-I70.0). 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 Electronically Signed   By: Lovena Le M.D.   On: 08/22/2019 04:50    Microbiology: Recent Results (from the past 240 hour(s))  SARS Coronavirus 2 [facility] order, Performed in [facility] hospital lab) Nasopharyngeal Nasopharyngeal Swab     Status: None   Collection Time: 08/22/19  8:54 AM   Specimen: Nasopharyngeal Swab  Result Value Ref Range Status   SARS Coronavirus 2 NEGATIVE NEGATIVE Final    Comment: (NOTE) If result is NEGATIVE SARS-CoV-2 target nucleic acids are NOT DETECTED. The SARS-CoV-2 RNA is generally detectable in upper and lower  respiratory specimens during the acute phase of infection. The lowest  concentration of SARS-CoV-2 viral copies this assay can detect is 250   copies / mL. A negative result does not preclude SARS-CoV-2 infection  and should not be used as the sole basis for treatment or other  patient management decisions.  A negative result may occur with  improper specimen collection / handling, submission of specimen other  than nasopharyngeal swab, presence of viral mutation(s) within the  areas targeted by this assay, and inadequate number of viral copies  (<250 copies / mL). A negative result must be combined with clinical  observations, patient history, and epidemiological information. If result is POSITIVE SARS-CoV-2 target nucleic acids are DETECTED. The SARS-CoV-2 RNA is generally detectable in upper and lower  respiratory specimens dur ing the acute phase of infection.  Positive  results are indicative of active infection with SARS-CoV-2.  Clinical  correlation with patient history and other diagnostic information is  necessary to determine patient infection status.  Positive results do  not rule out bacterial infection or co-infection with other viruses. If result is PRESUMPTIVE POSTIVE SARS-CoV-2 nucleic acids MAY BE PRESENT.   A presumptive positive result was obtained on the submitted specimen  and confirmed on repeat testing.  While 2019 novel coronavirus  (SARS-CoV-2) nucleic acids may be present in the submitted sample  additional confirmatory testing may be necessary for epidemiological  and / or clinical management purposes  to differentiate between  SARS-CoV-2 and other Sarbecovirus currently known to infect humans.  If clinically indicated additional testing with an alternate test  methodology [phone]) is advised. The SARS-CoV-2 RNA is generally  detectable in upper and lower respiratory sp ecimens during the acute  phase of infection. The expected result is Negative. Fact Sheet for Patients:  StrictlyIdeas.no Fact Sheet for Healthcare Providers: BankingDealers.co.za  This test is not yet approved or cleared by the [place] FDA and has been authorized for detection and/or diagnosis of SARS-CoV-2 by FDA under an Emergency Use Authorization (EUA).  This EUA will remain in effect (meaning this test can be used) for the duration of the COVID-19 declaration under Section 564(b)(1) of the Act, 21 U.S.C. section 360bbb-3(b)(1), unless the authorization is terminated or revoked sooner. Performed at [facility], [place] [address] [place], [place] [address]      Labs: Basic Metabolic Panel: Recent Labs  Lab 08/21/19 2226 08/22/19 [phone]  08/22/19 1805  08/22/19 1927 08/22/19 2250 08/23/19 0313 08/23/19 0654  NA 116* 120*   < > 125* 126* 130* 128* 127*  K 3.2* 2.7*  --   --   --   --  3.4*  --   CL 80* 84*  --   --   --   --  91*  --   CO2 22 23  --   --   --   --  25  --   GLUCOSE 131* 158*  --   --   --   --  75  --   BUN 17 15  --   --   --   --  19  --   CREATININE 1.27* 1.25*  --   --   --   --  1.32*  --   CALCIUM 8.0* 8.0*  --   --   --   --  8.4*  --    < > = values in this interval not displayed.   Liver Function Tests: Recent Labs  Lab 08/23/19 0313  AST 27  ALT 21  ALKPHOS 84  BILITOT 0.9  PROT 7.0  ALBUMIN 2.8*   Recent Labs  Lab 08/22/19 1805  LIPASE 37   No results for input(s): AMMONIA in the last 168 hours. CBC: Recent Labs  Lab 08/21/19 2226 08/23/19 0313  WBC 4.3 4.7  HGB 13.2 14.1  HCT 37.4* 41.2  MCV 90.1 90.9  PLT 186 198   Cardiac Enzymes: No results for input(s): CKTOTAL, CKMB, CKMBINDEX, TROPONINI in the last 168 hours. BNP: BNP (last 3 results) Recent Labs    08/01/19 1349 08/10/19 0629 08/22/19 0316  BNP 1,008.8* 780.2* 960.3*    ProBNP (last 3 results) No results for input(s): PROBNP in the last 8760 hours.  CBG: Recent Labs  Lab 08/22/19 1734 08/22/19 2008 08/23/19 0016 08/23/19 0318 08/23/19 0820  GLUCAP 134* 154* 127* 82 209*       Signed:  Kayleen Memos, MD  [facility] 08/23/2019, 11:34 AM

## 2019-08-24 ENCOUNTER — Telehealth: Payer: Self-pay

## 2019-08-24 NOTE — Telephone Encounter (Signed)
Transition Care Management Follow-up Telephone Call Date of discharge and from where: 08/24/2019, [facility]  - left AMA  Call placed to # [phone], message left requesting a call back to this CM # [phone]

## 2019-08-25 ENCOUNTER — Telehealth: Payer: Self-pay

## 2019-08-25 NOTE — Telephone Encounter (Signed)
Transition Care Management Follow-up Telephone Call Attempt # 2 Date of discharge and from where: 08/23/2019, [facility] . Left AMA  Call placed to # [phone] , message left requesting a call back to this CM # [phone]

## 2019-08-29 ENCOUNTER — Telehealth (HOSPITAL_COMMUNITY): Payer: Self-pay

## 2019-08-29 IMAGING — DX DG CHEST 1V PORT
1 series · 1 of 1 positions shown · non-contrast
Comparison: 08/20/2018

CLINICAL DATA: Chest pain, shortness of breath, nausea and
vomiting, and bilateral leg edema for 3 days.

EXAM:
PORTABLE CHEST 1 VIEW

[chest]
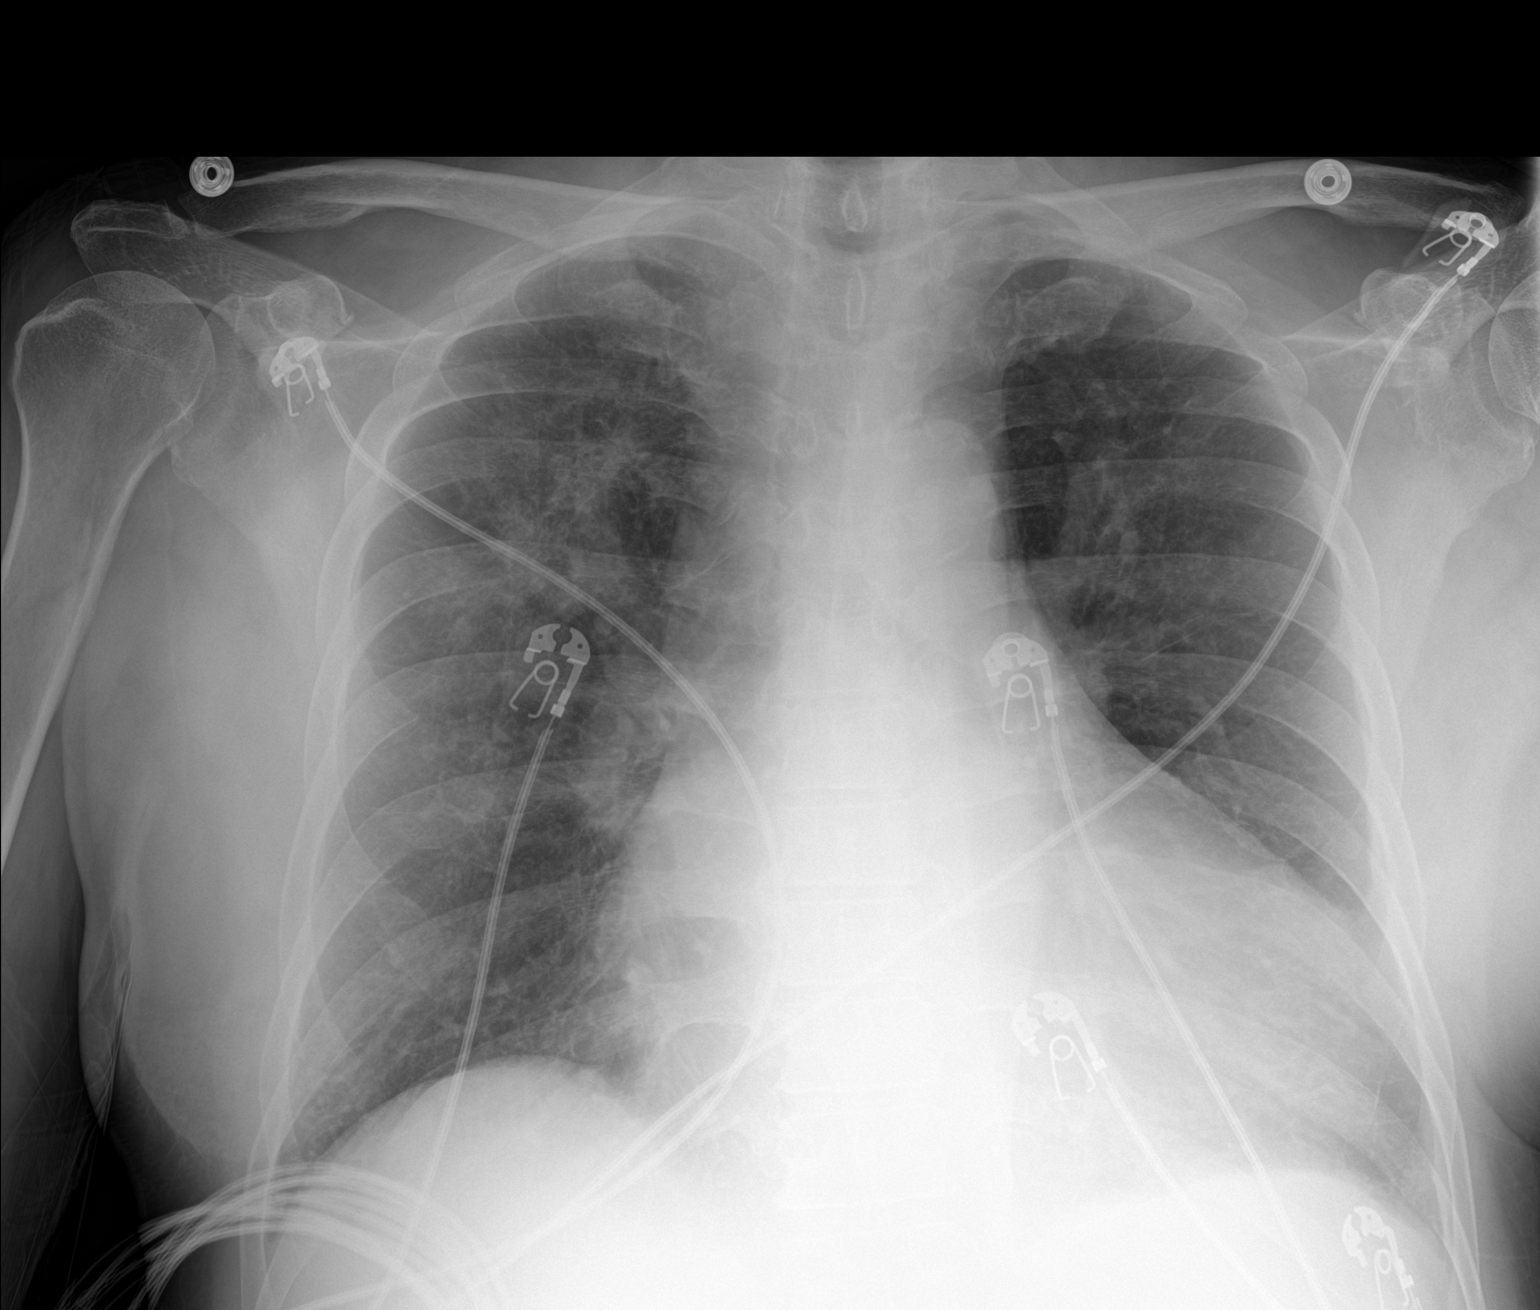

[1 of 1 positions shown; findings below may reference images not displayed]

FINDINGS: Cardiac enlargement. No vascular congestion or edema. Persistent
patchy opacity in the right upper lung has a somewhat branching
configuration, possibly indicating bronchopneumonia or mucous
plugging. No blunting of costophrenic angles. No pneumothorax.
Mediastinal contours appear intact. Calcification of the aorta.
IMPRESSION: Cardiac enlargement. No vascular congestion or edema. Persistent
patchy opacity in the right upper lung may represent
bronchopneumonia or mucous plugging.

## 2019-08-29 NOTE — Telephone Encounter (Signed)
Left voicemail for [name] to return my call to schedule a visit today. Will schedule upon returned call.

## 2019-08-29 NOTE — Telephone Encounter (Signed)
Spoke to [place] today. Christifer states he has not been feeling good over the last few days and states he has not had all of his medicines and the pharmacist will not refill any until actual refill date. Emilliano reports he is no longer hanging out with [name] and that [name] and him got into a disagreement and he is now staying with someone else. Irby stated he is staying at [facility] which he reports is near [employer] and [place] reports his roommate is his old friend from prison. I asked Angell if he feels safe there and he says yes. He did not report any drugs or alcohol being used. He did state his roommate does not like strangers coming over and would have to talk to him about me coming over for home visits. Ziv reports this being permanent housing. I advised Suchir I would follow up next week for home visit and medication refills. Gwyndolyn Saxon agreed.

## 2019-09-05 ENCOUNTER — Telehealth: Payer: Self-pay

## 2019-09-05 ENCOUNTER — Telehealth (HOSPITAL_COMMUNITY): Payer: Self-pay

## 2019-09-05 NOTE — Telephone Encounter (Signed)
Call received from patient confirming his appointment with Dr Margarita Rana tomorrow.  He explained that he now has an apartment with a friend. He is paying $235/month and has assisted with paying utilities.     he said that he has been having difficulty sleeping and will discuss with Dr Margarita Rana.

## 2019-09-05 NOTE — Telephone Encounter (Signed)
Spoke to Brad [name] today who advised he agreed to home visit next Monday around 1130. Brad [name] states he has had issues with lack of sleep and appetite lately. He reports needing something for sleep and to help him want to eat. Brad [name] also reports he is staying at [facility]. He says he has his own room and also just bought a car. Brad [name] reports picking up his medications at [facility] this morning and is taking them as prescribed. I will follow up with home visit in one week.

## 2019-09-06 ENCOUNTER — Ambulatory Visit: Payer: Medicaid Other | Admitting: Family Medicine

## 2019-09-06 NOTE — Telephone Encounter (Signed)
Noted  

## 2019-09-12 ENCOUNTER — Other Ambulatory Visit (HOSPITAL_COMMUNITY): Payer: Self-pay

## 2019-09-12 NOTE — Progress Notes (Signed)
Paramedicine Encounter    Patient ID: Brad [name], male    DOB: 1961-10-15, 58 y.o.   MRN: [id number]   Patient Care Team: Brad Rakes, MD as PCP - General (Family Medicine) Brad Pickett Nadean Corwin, MD as PCP - Cardiology (Cardiology) Brad Rakes, MD (Family Medicine)  Patient Active Problem List   Diagnosis Date Noted  . Acute systolic CHF (congestive heart failure) ([place]) 08/02/2019  . Diarrhea 07/29/2019  . Gastroenteritis 07/22/2019  . Hypomagnesemia 06/19/2019  . Chest pain 06/07/2019  . Elevated troponin 05/23/2019  . Tobacco abuse 05/23/2019  . Alcohol abuse 02/21/2019  . Frequent falls 01/17/2019  . Severe mitral regurgitation   . Fall 11/01/2018  . Hyponatremia 10/31/2018  . Chronic atrial fibrillation   . Chronic hyponatremia 08/16/2018  . NSVT (nonsustained ventricular tachycardia) ([place])   . Concussion with loss of consciousness   . Scalp laceration   . Trauma   . Permanent atrial fibrillation   . Hypertensive heart disease   . Shortness of breath   . Pyogenic inflammation of bone ([place])   . Cirrhosis ([place]) 03/23/2018  . Right ankle pain 03/23/2018  . Syncope 08/07/2017  . Acute on chronic combined systolic and diastolic CHF (congestive heart failure) ([place]) 07/09/2017  . Atrial flutter ([place]) 07/09/2017  . Pre-syncope 07/08/2017  . Neuropathy 05/08/2017  . Substance induced mood disorder ([place]) 10/06/2016  . CVA (cerebral vascular accident) ([place]) 09/18/2016  . Left sided numbness   . Homelessness 08/21/2016  . S/P ORIF (open reduction internal fixation) fracture 08/01/2016  . CAD (coronary artery disease), native coronary artery 07/30/2016  . Surgery, elective   . Insomnia 07/22/2016  . NSTEMI (non-ST elevated myocardial infarction) ([place])   . Anemia 07/05/2016  . Thrombocytopenia ([place]) 07/05/2016  . Cocaine abuse ([place]) 07/02/2016  . Chest pain on breathing 07/01/2016  . Essential hypertension 07/01/2016  . DM type 2 (diabetes mellitus, type 2) ([place])  07/01/2016  . Hypokalemia 07/01/2016  . CKD (chronic kidney disease) stage 3, GFR 30-59 ml/min (HCC) 07/01/2016  . Painful diabetic neuropathy ([place]) 07/01/2016  . Polysubstance abuse ([place]) 05/27/2016  . Chronic hepatitis C with cirrhosis ([place]) 05/27/2016  . Chronic diastolic congestive heart failure ([place]) 01/31/2016  . Depression 04/21/2012  . GERD (gastroesophageal reflux disease) 02/16/2012  . History of drug abuse ([place])   . Heroin addiction ([place]) 01/29/2012   No current outpatient medications on file. Allergies  Allergen Reactions  . Angiotensin Receptor Blockers Anaphylaxis and Other (See Comments)    (Angioedema also with Lisinopril, therefore ARB's are contraindicated)  . Lisinopril Anaphylaxis and Other (See Comments)    Throat swells  . Pamelor [Nortriptyline Hcl] Anaphylaxis and Swelling    Throat swells     Social History   Socioeconomic History  . Marital status: Married    Spouse name: Not on file  . Number of children: 3  . Years of education: 2y college  . Highest education level: Not on file  Occupational History  . 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 Financial resource strain: Not on file  . Food insecurity    Worry: Not on file    Inability: Not on file  . Transportation needs    Medical: Not on file    Non-medical: Not on file  Tobacco Use  . Smoking status: Current Every Day Smoker    Packs/day: 0.50    Years: 28.00    Pack years: 14.00  Types: Cigarettes  . Smokeless tobacco: Never Used  Substance and Sexual Activity  . Alcohol use: Yes    Alcohol/week: 7.0 standard drinks    Types: 7 Cans of beer per week    Comment: "I drink ~ 40oz beer/day"  . Drug use: Yes    Types: IV, Cocaine, Heroin    Comment: 08/17/2018 "no heroin since 2013; I use cocaine ~ once/month, last use 03/21/2019  . Sexual activity: Not Currently  Lifestyle  . Physical activity    Days per week: Not on file    Minutes per  session: Not on file  . Stress: Not on file  Relationships  . Social Herbalist on phone: Not on file    Gets together: Not on file    Attends religious service: Not on file    Active member of club or organization: Not on file    Attends meetings of clubs or organizations: Not on file    Relationship status: Not on file  . Intimate partner violence    Fear of current or ex partner: Not on file    Emotionally abused: Not on file    Physically abused: Not on file    Forced sexual activity: Not on file  Other Topics Concern  . Not on file  Social History Narrative   ** Merged History Encounter **       Incarcerated from 2006-2010, then 10/2011-12/2011.  Has been trying to get sober (no heroin, alcohol since 10/2011).     Physical Exam Vitals signs reviewed.  Constitutional:      Appearance: He is normal weight.  HENT:     Head: Normocephalic.     Nose: Nose normal.     Mouth/Throat:     Mouth: Mucous membranes are dry.  Eyes:     Pupils: Pupils are equal, round, and reactive to light.  Neck:     Musculoskeletal: Normal range of motion.  Cardiovascular:     Rate and Rhythm: Normal rate and regular rhythm.     Pulses: Normal pulses.     Heart sounds: Normal heart sounds.  Pulmonary:     Effort: Pulmonary effort is normal.     Breath sounds: Normal breath sounds.  Abdominal:     General: Abdomen is flat.     Palpations: Abdomen is soft.  Musculoskeletal: Normal range of motion.        General: No swelling.     Right lower leg: No edema.     Left lower leg: No edema.  Skin:    General: Skin is warm and dry.     Capillary Refill: Capillary refill takes less than 2 seconds.  Neurological:     Mental Status: He is alert. Mental status is at baseline.  Psychiatric:        Mood and Affect: Mood normal.      Arrived for home visit for Brad [name]. He was seated on the couch alert and oriented smoking a cigarette. Brad [name] reports feeling good today with no  complaints stating he has been doing good. Brad [name] is currently staying with a friend at [place]. Brad [name] reports he is renting a room from a friend he met in prison. He says he pays around $500 a month for all included utilities and rent. Liborio stated he has been able to get food and his medications. Jaideep reports having no shortness of breath, trouble sleeping, walking or eating. He states he had some issues  last week with those things but they have since resolved. Enos reports taking all of his medications. Vitals were obtained and are as noted. Patient stated he just got a new job working a few days a week at [employer] on [address]. Vang states he is making around $12 an hour. Virl Diamond he is very excited about the things that are happening in his life and has been feeling good. Shed agreed to home visit next week. Home visit complete.   CBG- 162  MEDS ORDERED: NONE   Future Appointments  Date Time Provider [place]  09/21/2019 10:30 AM Brad Rakes, MD [facility] None     ACTION: Home visit completed Next visit planned for one week

## 2019-09-19 ENCOUNTER — Telehealth (HOSPITAL_COMMUNITY): Payer: Self-pay

## 2019-09-19 ENCOUNTER — Other Ambulatory Visit (HOSPITAL_COMMUNITY): Payer: Self-pay

## 2019-09-19 NOTE — Telephone Encounter (Signed)
Confirmed appointment for home visit today at 1400.

## 2019-09-19 NOTE — Progress Notes (Signed)
Paramedicine Encounter    Patient ID: Brad [name], male    DOB: 1961-03-31, 58 y.o.   MRN: [id number]   Patient Care Team: Charlott Rakes, MD as PCP - General (Family Medicine) Debara Pickett Nadean Corwin, MD as PCP - Cardiology (Cardiology) Charlott Rakes, MD (Family Medicine)  Patient Active Problem List   Diagnosis Date Noted  . Acute systolic CHF (congestive heart failure) ([place]) 08/02/2019  . Diarrhea 07/29/2019  . Gastroenteritis 07/22/2019  . Hypomagnesemia 06/19/2019  . Chest pain 06/07/2019  . Elevated troponin 05/23/2019  . Tobacco abuse 05/23/2019  . Alcohol abuse 02/21/2019  . Frequent falls 01/17/2019  . Severe mitral regurgitation   . Fall 11/01/2018  . Hyponatremia 10/31/2018  . Chronic atrial fibrillation   . Chronic hyponatremia 08/16/2018  . NSVT (nonsustained ventricular tachycardia) ([place])   . Concussion with loss of consciousness   . Scalp laceration   . Trauma   . Permanent atrial fibrillation   . Hypertensive heart disease   . Shortness of breath   . Pyogenic inflammation of bone ([place])   . Cirrhosis ([place]) 03/23/2018  . Right ankle pain 03/23/2018  . Syncope 08/07/2017  . Acute on chronic combined systolic and diastolic CHF (congestive heart failure) ([place]) 07/09/2017  . Atrial flutter ([place]) 07/09/2017  . Pre-syncope 07/08/2017  . Neuropathy 05/08/2017  . Substance induced mood disorder ([place]) 10/06/2016  . CVA (cerebral vascular accident) ([place]) 09/18/2016  . Left sided numbness   . Homelessness 08/21/2016  . S/P ORIF (open reduction internal fixation) fracture 08/01/2016  . CAD (coronary artery disease), native coronary artery 07/30/2016  . Surgery, elective   . Insomnia 07/22/2016  . NSTEMI (non-ST elevated myocardial infarction) ([place])   . Anemia 07/05/2016  . Thrombocytopenia ([place]) 07/05/2016  . Cocaine abuse ([place]) 07/02/2016  . Chest pain on breathing 07/01/2016  . Essential hypertension 07/01/2016  . DM type 2 (diabetes mellitus, type 2) ([place])  07/01/2016  . Hypokalemia 07/01/2016  . CKD (chronic kidney disease) stage 3, GFR 30-59 ml/min (HCC) 07/01/2016  . Painful diabetic neuropathy ([place]) 07/01/2016  . Polysubstance abuse ([place]) 05/27/2016  . Chronic hepatitis C with cirrhosis ([place]) 05/27/2016  . Chronic diastolic congestive heart failure ([place]) 01/31/2016  . Depression 04/21/2012  . GERD (gastroesophageal reflux disease) 02/16/2012  . History of drug abuse ([place])   . Heroin addiction ([place]) 01/29/2012    Current Outpatient Medications:  .  diltiazem (CARDIZEM) 30 MG tablet, Take 30 mg by mouth 2 (two) times daily., Disp: , Rfl:  .  DULoxetine (CYMBALTA) 60 MG capsule, Take 60 mg by mouth daily., Disp: , Rfl:  .  gabapentin (NEURONTIN) 300 MG capsule, Take 300 mg by mouth 2 (two) times daily., Disp: , Rfl:  .  isosorbide mononitrate (IMDUR) 30 MG 24 hr tablet, Take 30 mg by mouth daily., Disp: , Rfl:  .  mirtazapine (REMERON) 15 MG tablet, Take 15 mg by mouth at bedtime., Disp: , Rfl:  .  potassium chloride SA (K-DUR) 20 MEQ tablet, Take 20 mEq by mouth daily., Disp: , Rfl:  .  torsemide (DEMADEX) 20 MG tablet, Take 20 mg by mouth 2 (two) times daily., Disp: , Rfl:  Allergies  Allergen Reactions  . Angiotensin Receptor Blockers Anaphylaxis and Other (See Comments)    (Angioedema also with Lisinopril, therefore ARB's are contraindicated)  . Lisinopril Anaphylaxis and Other (See Comments)    Throat swells  . Pamelor [Nortriptyline Hcl] Anaphylaxis and Swelling    Throat swells     Social History  Socioeconomic History  . Marital status: Married    Spouse name: Not on file  . Number of children: 3  . Years of education: 2y college  . Highest education level: Not on file  Occupational History  . 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 Financial resource strain: Not on file  . Food insecurity    Worry: Not on file    Inability: Not on file  . Transportation needs    Medical:  Not on file    Non-medical: Not on file  Tobacco Use  . Smoking status: Current Every Day Smoker    Packs/day: 0.50    Years: 28.00    Pack years: 14.00    Types: Cigarettes  . Smokeless tobacco: Never Used  Substance and Sexual Activity  . Alcohol use: Yes    Alcohol/week: 7.0 standard drinks    Types: 7 Cans of beer per week    Comment: "I drink ~ 40oz beer/day"  . Drug use: Yes    Types: IV, Cocaine, Heroin    Comment: 08/17/2018 "no heroin since 2013; I use cocaine ~ once/month, last use 03/21/2019  . Sexual activity: Not Currently  Lifestyle  . Physical activity    Days per week: Not on file    Minutes per session: Not on file  . Stress: Not on file  Relationships  . Social Herbalist on phone: Not on file    Gets together: Not on file    Attends religious service: Not on file    Active member of club or organization: Not on file    Attends meetings of clubs or organizations: Not on file    Relationship status: Not on file  . Intimate partner violence    Fear of current or ex partner: Not on file    Emotionally abused: Not on file    Physically abused: Not on file    Forced sexual activity: Not on file  Other Topics Concern  . Not on file  Social History Narrative   ** Merged History Encounter **       Incarcerated from 2006-2010, then 10/2011-12/2011.  Has been trying to get sober (no heroin, alcohol since 10/2011).     Physical Exam Vitals signs reviewed.  HENT:     Head: Normocephalic.     Nose: Nose normal.     Mouth/Throat:     Mouth: Mucous membranes are moist.  Eyes:     Pupils: Pupils are equal, round, and reactive to light.  Cardiovascular:     Rate and Rhythm: Normal rate and regular rhythm.     Pulses: Normal pulses.     Heart sounds: Normal heart sounds.  Pulmonary:     Effort: Pulmonary effort is normal.     Breath sounds: Normal breath sounds.  Abdominal:     General: Abdomen is flat.     Palpations: Abdomen is soft.   Musculoskeletal: Normal range of motion.     Right lower leg: No edema.     Left lower leg: No edema.  Skin:    General: Skin is warm and dry.     Capillary Refill: Capillary refill takes less than 2 seconds.  Neurological:     Mental Status: He is alert. Mental status is at baseline.  Psychiatric:        Mood and Affect: Mood normal.      Arrived for home visit for  Brad [name] who was seated on the couch alert and oriented. Brad [name] reports feeling okay other than having some chest tightness after receiving some news about his job being transferred to another location and him being nervous about same. Patient also reports having some coughing from smoking half a pack of cigarettes a day. I encouraged Brad [name] to reduce his smoking habits if at all possible to improve his overall health. He agreed to trying to lower the amount he smokes daily. Vitals were obtained and are as noted. Brad [name] reports taking all of his prescribed medication. Pills were verified and added into Epic as upon looking at his list there were none listed. Brad [name] was reminded of his upcoming appointment with Dr. Margarita Rana on [place] at 1030. Brad [name] agreed to same. Brad [name] also agreed to home visit in one week. Home visit complete.    CBG- 170   No meds ordered.    Future Appointments  Date Time Provider [place]  09/21/2019 10:30 AM Charlott Rakes, MD [facility] None     ACTION: Home visit completed Next visit planned for one week

## 2019-09-21 ENCOUNTER — Ambulatory Visit: Payer: Medicaid Other | Attending: Family Medicine | Admitting: Family Medicine

## 2019-09-21 ENCOUNTER — Ambulatory Visit (HOSPITAL_BASED_OUTPATIENT_CLINIC_OR_DEPARTMENT_OTHER): Payer: Medicaid Other | Admitting: Licensed Clinical Social Worker

## 2019-09-21 ENCOUNTER — Other Ambulatory Visit: Payer: Self-pay

## 2019-09-21 ENCOUNTER — Encounter: Payer: Self-pay | Admitting: Family Medicine

## 2019-09-21 VITALS — BP 130/91 | HR 104 | Temp 98.0°F | Ht 71.0 in | Wt 173.6 lb

## 2019-09-21 DIAGNOSIS — I428 Other cardiomyopathies: Secondary | ICD-10-CM | POA: Diagnosis not present

## 2019-09-21 DIAGNOSIS — N183 Chronic kidney disease, stage 3 (moderate): Secondary | ICD-10-CM | POA: Diagnosis not present

## 2019-09-21 DIAGNOSIS — Z79899 Other long term (current) drug therapy: Secondary | ICD-10-CM | POA: Diagnosis not present

## 2019-09-21 DIAGNOSIS — G4709 Other insomnia: Secondary | ICD-10-CM

## 2019-09-21 DIAGNOSIS — M1990 Unspecified osteoarthritis, unspecified site: Secondary | ICD-10-CM | POA: Insufficient documentation

## 2019-09-21 DIAGNOSIS — Z794 Long term (current) use of insulin: Secondary | ICD-10-CM | POA: Diagnosis not present

## 2019-09-21 DIAGNOSIS — E1159 Type 2 diabetes mellitus with other circulatory complications: Secondary | ICD-10-CM

## 2019-09-21 DIAGNOSIS — Z8249 Family history of ischemic heart disease and other diseases of the circulatory system: Secondary | ICD-10-CM | POA: Diagnosis not present

## 2019-09-21 DIAGNOSIS — E1142 Type 2 diabetes mellitus with diabetic polyneuropathy: Secondary | ICD-10-CM | POA: Diagnosis not present

## 2019-09-21 DIAGNOSIS — I482 Chronic atrial fibrillation, unspecified: Secondary | ICD-10-CM | POA: Diagnosis not present

## 2019-09-21 DIAGNOSIS — I5042 Chronic combined systolic (congestive) and diastolic (congestive) heart failure: Secondary | ICD-10-CM | POA: Insufficient documentation

## 2019-09-21 DIAGNOSIS — I10 Essential (primary) hypertension: Secondary | ICD-10-CM

## 2019-09-21 DIAGNOSIS — E7800 Pure hypercholesterolemia, unspecified: Secondary | ICD-10-CM | POA: Insufficient documentation

## 2019-09-21 DIAGNOSIS — K7460 Unspecified cirrhosis of liver: Secondary | ICD-10-CM | POA: Diagnosis not present

## 2019-09-21 DIAGNOSIS — I5022 Chronic systolic (congestive) heart failure: Secondary | ICD-10-CM

## 2019-09-21 DIAGNOSIS — I110 Hypertensive heart disease with heart failure: Secondary | ICD-10-CM

## 2019-09-21 DIAGNOSIS — E1140 Type 2 diabetes mellitus with diabetic neuropathy, unspecified: Secondary | ICD-10-CM

## 2019-09-21 DIAGNOSIS — I130 Hypertensive heart and chronic kidney disease with heart failure and stage 1 through stage 4 chronic kidney disease, or unspecified chronic kidney disease: Secondary | ICD-10-CM | POA: Diagnosis not present

## 2019-09-21 DIAGNOSIS — N529 Male erectile dysfunction, unspecified: Secondary | ICD-10-CM | POA: Diagnosis not present

## 2019-09-21 DIAGNOSIS — B1920 Unspecified viral hepatitis C without hepatic coma: Secondary | ICD-10-CM | POA: Insufficient documentation

## 2019-09-21 DIAGNOSIS — Z888 Allergy status to other drugs, medicaments and biological substances status: Secondary | ICD-10-CM | POA: Diagnosis not present

## 2019-09-21 DIAGNOSIS — K219 Gastro-esophageal reflux disease without esophagitis: Secondary | ICD-10-CM | POA: Insufficient documentation

## 2019-09-21 DIAGNOSIS — N528 Other male erectile dysfunction: Secondary | ICD-10-CM

## 2019-09-21 DIAGNOSIS — Z9119 Patient's noncompliance with other medical treatment and regimen: Secondary | ICD-10-CM | POA: Insufficient documentation

## 2019-09-21 DIAGNOSIS — E1122 Type 2 diabetes mellitus with diabetic chronic kidney disease: Secondary | ICD-10-CM | POA: Insufficient documentation

## 2019-09-21 LAB — POCT GLYCOSYLATED HEMOGLOBIN (HGB A1C): HbA1c, POC (controlled diabetic range): 7.2 % — AB (ref 0.0–7.0)

## 2019-09-21 LAB — GLUCOSE, POCT (MANUAL RESULT ENTRY): POC Glucose: 181 mg/dl — AB (ref 70–99)

## 2019-09-21 MED ORDER — GABAPENTIN 300 MG PO CAPS: 300.0000 mg | ORAL_CAPSULE | Freq: Two times a day (BID) | ORAL | 6 refills | Status: DC

## 2019-09-21 MED ORDER — LANTUS SOLOSTAR 100 UNIT/ML ~~LOC~~ SOPN: 5.0000 [IU] | PEN_INJECTOR | Freq: Every day | SUBCUTANEOUS | 6 refills | Status: DC

## 2019-09-21 MED ORDER — MIRTAZAPINE 15 MG PO TABS: 15.0000 mg | ORAL_TABLET | Freq: Every day | ORAL | 6 refills | Status: DC

## 2019-09-21 MED ORDER — POTASSIUM CHLORIDE CRYS ER 20 MEQ PO TBCR: 20.0000 meq | EXTENDED_RELEASE_TABLET | Freq: Every day | ORAL | 6 refills | Status: DC

## 2019-09-21 MED ORDER — MIRTAZAPINE 30 MG PO TABS
30.0000 mg | ORAL_TABLET | Freq: Every day | ORAL | 6 refills | Status: DC
Start: 2019-09-21 — End: 2019-10-19

## 2019-09-21 MED ORDER — ISOSORBIDE MONONITRATE ER 30 MG PO TB24: 30.0000 mg | ORAL_TABLET | Freq: Every day | ORAL | 6 refills | Status: DC

## 2019-09-21 MED ORDER — DULOXETINE HCL 60 MG PO CPEP: 60.0000 mg | ORAL_CAPSULE | Freq: Every day | ORAL | 6 refills | Status: DC

## 2019-09-21 MED ORDER — APIXABAN 5 MG PO TABS: 5.0000 mg | ORAL_TABLET | Freq: Two times a day (BID) | ORAL | 6 refills | Status: DC

## 2019-09-21 MED ORDER — DILTIAZEM HCL 30 MG PO TABS: 30.0000 mg | ORAL_TABLET | Freq: Two times a day (BID) | ORAL | 6 refills | Status: DC

## 2019-09-21 MED ORDER — TORSEMIDE 20 MG PO TABS: 20.0000 mg | ORAL_TABLET | Freq: Two times a day (BID) | ORAL | 6 refills | Status: DC
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
## 2019-09-22 ENCOUNTER — Telehealth (HOSPITAL_COMMUNITY): Payer: Self-pay

## 2019-09-22 ENCOUNTER — Telehealth: Payer: Self-pay

## 2019-09-22 NOTE — Telephone Encounter (Signed)
Call received from patient.  He explained that his disability check  was not deposited in his account as scheduled last night.  He is on verge of having to leave the apartment he is in if he does not receive this money for rent.   He needs to obtain his social security card and ID from a prior landlord and fax requested information to his bank.

## 2019-09-22 NOTE — Telephone Encounter (Signed)
Brad [name] called in regards to having some increased swelling in his legs but states he was up on his feet a lot yesterday. I advised him to keep his legs elevated through the weekend and we will re-assess on Monday and reach out to HF clinic if needed. Brad [name] agreed with same.

## 2019-09-23 ENCOUNTER — Telehealth: Payer: Self-pay | Admitting: Family Medicine

## 2019-09-23 NOTE — Telephone Encounter (Signed)
Call placed to patient to follow up on housing resources. LCSW left message for a return call.

## 2019-09-23 NOTE — Telephone Encounter (Signed)
Pt would like a call back in regards to his rent, please follow up when possible with resources to help him out, please see previous note from [place]

## 2019-09-23 NOTE — BH Specialist Note (Signed)
Integrated Behavioral Health Initial Visit  MRN: [id number] Name: Brad [name]  Number of [place] Clinician visits:: 1/6 Session Start time: 11:30 AM  Session End time: 11:45 AM Total time: 15 minutes  Type of Service: [place] Interpretor:No. Interpretor Name and Language: na   Warm Hand Off Completed.       SUBJECTIVE: Brad [name] is a 58 y.o. male accompanied by self Patient was referred by Dr. Margarita Rana for sleep hygiene. Patient reports the following symptoms/concerns: Pt reports difficulty sleeping. He attributes this to sobriety from alcohol stating, "I used to drink to sleep" Duration of problem: 1 month; Severity of problem: mild  OBJECTIVE: Mood: Pleasant and Affect: Appropriate Risk of harm to self or others: No plan to harm self or others  LIFE CONTEXT: Family and Social: Pt has a Passenger transport manager. Reports emotional support from adult children School/Work: Pt is employed part-time through [employer] Self-Care: Pt reports quitting alcohol use "cold [place]" He is not interested in participating in [place] to strengthen support. Enjoys watching movies Life Changes: Pt recently obtained housing and employment. Pt is motivated to remaining sober; however, reports difficulty sleeping  GOALS ADDRESSED: Patient will: 1. Reduce symptoms of: insomnia 2. Increase knowledge and/or ability of: self-management skills  3. Demonstrate ability to: Increase healthy adjustment to current life circumstances and Increase adequate support systems for patient/family  INTERVENTIONS: Interventions utilized: Sleep Hygiene  Standardized Assessments completed: GAD-7 and PHQ 2&9  ASSESSMENT: Patient currently experiencing insomnia. He attributes this to sobriety from alcohol stating, "I used to drink to sleep" Reports emotional support from adult children   Patient may benefit from supportive services, such as AA. He is not interested in  participating in [place] to strengthen support system. LCSW provided information on strategies to improve sleep hygiene. He was commended on maintaining sobriety and motivational agents were discussed  PLAN: 1. Follow up with behavioral health clinician on : Schedule follow up with LCSW, if needed 2. Behavioral recommendations: LCSW recommends pt strengthen support system and utilize strategies for sleep hygiene 3. Referral(s): [place] (In Clinic) 4. "From scale of 1-10, how likely are you to follow plan?":   Rebekah [place], LCSW 09/23/2019 11:17 AM

## 2019-09-26 ENCOUNTER — Other Ambulatory Visit (HOSPITAL_COMMUNITY): Payer: Self-pay

## 2019-09-26 ENCOUNTER — Telehealth: Payer: Self-pay

## 2019-09-26 NOTE — Telephone Encounter (Signed)
As per [name], EMT the patient stated that the remeron 30 mg is  not working. She inquired if melatonin would be an option.

## 2019-09-26 NOTE — Telephone Encounter (Signed)
He has only had Remeron 30 mg for a few days. Yes melatonin OTC would be an option.

## 2019-09-26 NOTE — Progress Notes (Signed)
Paramedicine Encounter    Patient ID: Brad [name], male    DOB: 03/17/1961, 58 y.o.   MRN: [id number]   Patient Care Team: Charlott Rakes, MD as PCP - General (Family Medicine) Debara Pickett Nadean Corwin, MD as PCP - Cardiology (Cardiology) Charlott Rakes, MD (Family Medicine)  Patient Active Problem List   Diagnosis Date Noted  . Acute systolic CHF (congestive heart failure) ([place]) 08/02/2019  . Diarrhea 07/29/2019  . Gastroenteritis 07/22/2019  . Hypomagnesemia 06/19/2019  . Chest pain 06/07/2019  . Elevated troponin 05/23/2019  . Tobacco abuse 05/23/2019  . Alcohol abuse 02/21/2019  . Frequent falls 01/17/2019  . Severe mitral regurgitation   . Fall 11/01/2018  . Hyponatremia 10/31/2018  . Chronic atrial fibrillation   . Chronic hyponatremia 08/16/2018  . NSVT (nonsustained ventricular tachycardia) ([place])   . Concussion with loss of consciousness   . Scalp laceration   . Trauma   . Permanent atrial fibrillation   . Hypertensive heart disease   . Shortness of breath   . Pyogenic inflammation of bone ([place])   . Cirrhosis ([place]) 03/23/2018  . Right ankle pain 03/23/2018  . Syncope 08/07/2017  . Acute on chronic combined systolic and diastolic CHF (congestive heart failure) ([place]) 07/09/2017  . Atrial flutter ([place]) 07/09/2017  . Pre-syncope 07/08/2017  . Neuropathy 05/08/2017  . Substance induced mood disorder ([place]) 10/06/2016  . CVA (cerebral vascular accident) ([place]) 09/18/2016  . Left sided numbness   . Homelessness 08/21/2016  . S/P ORIF (open reduction internal fixation) fracture 08/01/2016  . CAD (coronary artery disease), native coronary artery 07/30/2016  . Surgery, elective   . Insomnia 07/22/2016  . NSTEMI (non-ST elevated myocardial infarction) ([place])   . Anemia 07/05/2016  . Thrombocytopenia ([place]) 07/05/2016  . Cocaine abuse ([place]) 07/02/2016  . Chest pain on breathing 07/01/2016  . Essential hypertension 07/01/2016  . DM type 2 (diabetes mellitus, type 2) ([place])  07/01/2016  . Hypokalemia 07/01/2016  . CKD (chronic kidney disease) stage 3, GFR 30-59 ml/min 07/01/2016  . Painful diabetic neuropathy ([place]) 07/01/2016  . Polysubstance abuse ([place]) 05/27/2016  . Chronic hepatitis C with cirrhosis ([place]) 05/27/2016  . Chronic diastolic congestive heart failure ([place]) 01/31/2016  . Depression 04/21/2012  . GERD (gastroesophageal reflux disease) 02/16/2012  . History of drug abuse ([place])   . Heroin addiction ([place]) 01/29/2012    Current Outpatient Medications:  .  apixaban (ELIQUIS) 5 MG TABS tablet, Take 1 tablet (5 mg total) by mouth 2 (two) times daily., Disp: 60 tablet, Rfl: 6 .  diltiazem (CARDIZEM) 30 MG tablet, Take 1 tablet (30 mg total) by mouth 2 (two) times daily., Disp: 30 tablet, Rfl: 6 .  DULoxetine (CYMBALTA) 60 MG capsule, Take 1 capsule (60 mg total) by mouth daily., Disp: 30 capsule, Rfl: 6 .  gabapentin (NEURONTIN) 300 MG capsule, Take 1 capsule (300 mg total) by mouth 2 (two) times daily., Disp: 30 capsule, Rfl: 6 .  Insulin Glargine (LANTUS SOLOSTAR) 100 UNIT/ML Solostar Pen, Inject 5 Units into the skin at bedtime., Disp: 3 pen, Rfl: 6 .  isosorbide mononitrate (IMDUR) 30 MG 24 hr tablet, Take 1 tablet (30 mg total) by mouth daily., Disp: 30 tablet, Rfl: 6 .  mirtazapine (REMERON) 30 MG tablet, Take 1 tablet (30 mg total) by mouth at bedtime., Disp: 30 tablet, Rfl: 6 .  potassium chloride SA (KLOR-CON) 20 MEQ tablet, Take 1 tablet (20 mEq total) by mouth daily., Disp: 30 tablet, Rfl: 6 .  torsemide (DEMADEX) 20 MG  tablet, Take 1 tablet (20 mg total) by mouth 2 (two) times daily., Disp: 60 tablet, Rfl: 6 Allergies  Allergen Reactions  . Angiotensin Receptor Blockers Anaphylaxis and Other (See Comments)    (Angioedema also with Lisinopril, therefore ARB's are contraindicated)  . Lisinopril Anaphylaxis and Other (See Comments)    Throat swells  . Pamelor [Nortriptyline Hcl] Anaphylaxis and Swelling    Throat swells     Social History    Socioeconomic History  . Marital status: Married    Spouse name: Not on file  . Number of children: 3  . Years of education: 2y college  . Highest education level: Not on file  Occupational History  . 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 Financial resource strain: Not on file  . Food insecurity    Worry: Not on file    Inability: Not on file  . Transportation needs    Medical: Not on file    Non-medical: Not on file  Tobacco Use  . Smoking status: Current Every Day Smoker    Packs/day: 0.50    Years: 28.00    Pack years: 14.00    Types: Cigarettes  . Smokeless tobacco: Never Used  Substance and Sexual Activity  . Alcohol use: Yes    Alcohol/week: 7.0 standard drinks    Types: 7 Cans of beer per week    Comment: "I drink ~ 40oz beer/day"  . Drug use: Yes    Types: IV, Cocaine, Heroin    Comment: 08/17/2018 "no heroin since 2013; I use cocaine ~ once/month, last use 03/21/2019  . Sexual activity: Not Currently  Lifestyle  . Physical activity    Days per week: Not on file    Minutes per session: Not on file  . Stress: Not on file  Relationships  . Social Herbalist on phone: Not on file    Gets together: Not on file    Attends religious service: Not on file    Active member of club or organization: Not on file    Attends meetings of clubs or organizations: Not on file    Relationship status: Not on file  . Intimate partner violence    Fear of current or ex partner: Not on file    Emotionally abused: Not on file    Physically abused: Not on file    Forced sexual activity: Not on file  Other Topics Concern  . Not on file  Social History Narrative   ** Merged History Encounter **       Incarcerated from 2006-2010, then 10/2011-12/2011.  Has been trying to get sober (no heroin, alcohol since 10/2011).     Physical Exam Vitals signs reviewed.  HENT:     Head: Normocephalic.     Nose: Nose normal.      Mouth/Throat:     Mouth: Mucous membranes are moist.  Eyes:     Pupils: Pupils are equal, round, and reactive to light.  Neck:     Musculoskeletal: Normal range of motion.  Cardiovascular:     Rate and Rhythm: Normal rate and regular rhythm.     Pulses: Normal pulses.     Heart sounds: Normal heart sounds.  Pulmonary:     Effort: Pulmonary effort is normal.     Breath sounds: Normal breath sounds.  Abdominal:     General: Abdomen is flat.     Palpations: Abdomen is soft.  Musculoskeletal: Normal range of motion.     Right lower leg: No edema.     Left lower leg: No edema.  Skin:    General: Skin is warm and dry.     Capillary Refill: Capillary refill takes less than 2 seconds.  Neurological:     Mental Status: He is alert. Mental status is at baseline.  Psychiatric:        Mood and Affect: Mood normal.     CBG- 204    Arrived for home visit with Gwyndolyn Saxon who reports "I am not doing to good". Zaccari explained in detail some issues with his check from social services not being delivered due to issues with direct deposit. Ibn was able to speak to social services while in my presence and confirmed check would be mailed this week. Tycho reports he is going to open a bank account with [employer] this week to ensure he is getting his money promptly and safely. Keishawn was reminded the importance of a plan B for housing and that resources were available to him. Mosiah understood same. Vitals were obtained and are as recorded. Medications were reviewed and confirmed. Gwyndolyn Saxon elects not to use pill box. I expressed the importance of taking his medications daily and he reports he does. Physical exam as noted. Caleel reporting Remeron not working for sleep, even after increase. Delyle requests Trazadone or Amitriptyline for sleep. I advised I would pass on his concern to Dr. Margarita Rana. I also asked [name] to ask Dr. Margarita Rana her advice on Melatonin for sleep aid for Mr. Burfeind. Awaiting reply. Home  visit complete.   MEDS ORDERED: Gabapentin  Remeron     No future appointments.   ACTION: Home visit completed Next visit planned for one week

## 2019-09-27 NOTE — Telephone Encounter (Signed)
Message sent to [name], EMT informing her that melatonin would be an option for patient

## 2019-09-30 ENCOUNTER — Emergency Department (HOSPITAL_COMMUNITY)
Admission: EM | Admit: 2019-09-30 | Discharge: 2019-09-30 | Disposition: A | Discharge status: Home / Self Care | Payer: Medicaid Other | Attending: Emergency Medicine | Admitting: Emergency Medicine

## 2019-09-30 ENCOUNTER — Emergency Department (HOSPITAL_COMMUNITY): Payer: Medicaid Other

## 2019-09-30 ENCOUNTER — Other Ambulatory Visit: Payer: Self-pay

## 2019-09-30 ENCOUNTER — Encounter (HOSPITAL_COMMUNITY): Payer: Self-pay | Admitting: Emergency Medicine

## 2019-09-30 DIAGNOSIS — I4891 Unspecified atrial fibrillation: Secondary | ICD-10-CM | POA: Insufficient documentation

## 2019-09-30 DIAGNOSIS — Z7901 Long term (current) use of anticoagulants: Secondary | ICD-10-CM | POA: Diagnosis not present

## 2019-09-30 DIAGNOSIS — I130 Hypertensive heart and chronic kidney disease with heart failure and stage 1 through stage 4 chronic kidney disease, or unspecified chronic kidney disease: Secondary | ICD-10-CM | POA: Insufficient documentation

## 2019-09-30 DIAGNOSIS — Z794 Long term (current) use of insulin: Secondary | ICD-10-CM | POA: Insufficient documentation

## 2019-09-30 DIAGNOSIS — N183 Chronic kidney disease, stage 3 unspecified: Secondary | ICD-10-CM | POA: Insufficient documentation

## 2019-09-30 DIAGNOSIS — E1122 Type 2 diabetes mellitus with diabetic chronic kidney disease: Secondary | ICD-10-CM | POA: Diagnosis not present

## 2019-09-30 DIAGNOSIS — Z79899 Other long term (current) drug therapy: Secondary | ICD-10-CM | POA: Insufficient documentation

## 2019-09-30 DIAGNOSIS — I5042 Chronic combined systolic (congestive) and diastolic (congestive) heart failure: Secondary | ICD-10-CM | POA: Diagnosis not present

## 2019-09-30 DIAGNOSIS — R0789 Other chest pain: Secondary | ICD-10-CM | POA: Diagnosis not present

## 2019-09-30 DIAGNOSIS — F17210 Nicotine dependence, cigarettes, uncomplicated: Secondary | ICD-10-CM | POA: Diagnosis not present

## 2019-09-30 LAB — CBC
HCT: 43.2 % (ref 39.0–52.0)
Hemoglobin: 14.9 g/dL (ref 13.0–17.0)
MCH: 31.4 pg (ref 26.0–34.0)
MCHC: 34.5 g/dL (ref 30.0–36.0)
MCV: 90.9 fL (ref 80.0–100.0)
Platelets: 221 10*3/uL (ref 150–400)
RBC: 4.75 MIL/uL (ref 4.22–5.81)
RDW: 13.4 % (ref 11.5–15.5)
WBC: 4.4 10*3/uL (ref 4.0–10.5)
nRBC: 0 % (ref 0.0–0.2)

## 2019-09-30 LAB — BASIC METABOLIC PANEL
Anion gap: 12 (ref 5–15)
BUN: 26 mg/dL — ABNORMAL HIGH (ref 6–20)
CO2: 26 mmol/L (ref 22–32)
Calcium: 7.7 mg/dL — ABNORMAL LOW (ref 8.9–10.3)
Chloride: 96 mmol/L — ABNORMAL LOW (ref 98–111)
Creatinine, Ser: 1.39 mg/dL — ABNORMAL HIGH (ref 0.61–1.24)
GFR calc Af Amer: >60 mL/min (ref >60)
GFR calc non Af Amer: 55 mL/min — ABNORMAL LOW (ref >60)
Glucose, Bld: 176 mg/dL — ABNORMAL HIGH (ref 70–99)
Potassium: 3 mmol/L — ABNORMAL LOW (ref 3.5–5.1)
Sodium: 134 mmol/L — ABNORMAL LOW (ref 135–145)

## 2019-09-30 LAB — BRAIN NATRIURETIC PEPTIDE: B Natriuretic Peptide: 595.5 pg/mL — ABNORMAL HIGH (ref 0.0–100.0)

## 2019-09-30 LAB — HEPATIC FUNCTION PANEL
ALT: 18 U/L (ref 0–44)
AST: 21 U/L (ref 15–41)
Albumin: 2.1 g/dL — ABNORMAL LOW (ref 3.5–5.0)
Alkaline Phosphatase: 56 U/L (ref 38–126)
Bilirubin, Direct: 0.2 mg/dL (ref 0.0–0.2)
Indirect Bilirubin: 0.5 mg/dL (ref 0.3–0.9)
Total Bilirubin: 0.7 mg/dL (ref 0.3–1.2)
Total Protein: 5.5 g/dL — ABNORMAL LOW (ref 6.5–8.1)

## 2019-09-30 LAB — TROPONIN I (HIGH SENSITIVITY)
Troponin I (High Sensitivity): 45 ng/L — ABNORMAL HIGH (ref <18)
Troponin I (High Sensitivity): 46 ng/L — ABNORMAL HIGH (ref <18)

## 2019-09-30 MED ORDER — HYDROMORPHONE HCL 1 MG/ML IJ SOLN
0.5000 mg | Freq: Once | INTRAMUSCULAR | Status: AC
  Administered 2019-09-30: 0.5 mg via INTRAVENOUS
  Filled 2019-09-30: qty 1

## 2019-09-30 MED ORDER — OXYCODONE-ACETAMINOPHEN 5-325 MG PO TABS: ORAL_TABLET | ORAL | 0 refills | Status: DC

## 2019-09-30 MED ORDER — SODIUM CHLORIDE 0.9% FLUSH
3.0000 mL | Freq: Once | INTRAVENOUS | Status: AC
  Administered 2019-09-30: 3 mL via INTRAVENOUS

## 2019-09-30 MED ORDER — POTASSIUM CHLORIDE CRYS ER 20 MEQ PO TBCR
40.0000 meq | EXTENDED_RELEASE_TABLET | Freq: Once | ORAL | Status: AC
  Administered 2019-09-30: 40 meq via ORAL
  Filled 2019-09-30: qty 2

## 2019-09-30 MED ORDER — POTASSIUM CHLORIDE 10 MEQ/100ML IV SOLN
10.0000 meq | Freq: Once | INTRAVENOUS | Status: AC
  Administered 2019-09-30: 10 meq via INTRAVENOUS
  Filled 2019-09-30: qty 100

## 2019-09-30 NOTE — ED Triage Notes (Signed)
Pt reports chest pain that has been going on for 3 days. Pt had EMS at his house yesterday and refused treatment and transport.
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
## 2019-09-30 NOTE — Discharge Instructions (Signed)
Follow-up with your family doctor in 1 to 2 weeks for recheck 

## 2019-09-30 NOTE — ED Notes (Signed)
Patient verbalizes understanding of discharge instructions. Opportunity for questioning and answers were provided. Armband removed by staff, pt discharged from ED.  

## 2019-10-01 ENCOUNTER — Telehealth (HOSPITAL_COMMUNITY): Payer: Self-pay

## 2019-10-01 NOTE — Telephone Encounter (Signed)
Received voicemail from Pearl stating he received news of his son from [place] passing away. I have attempted to reach Mr. Acker to check in on him with failed contact. Will continue to follow up and complete a home visit on Monday.

## 2019-10-03 ENCOUNTER — Other Ambulatory Visit (HOSPITAL_COMMUNITY): Payer: Self-pay

## 2019-10-03 ENCOUNTER — Telehealth (HOSPITAL_COMMUNITY): Payer: Self-pay

## 2019-10-03 NOTE — Progress Notes (Signed)
Paramedicine Encounter    Patient ID: Brad [name], male    DOB: July 24, 1961, 58 y.o.   MRN: [id number]   Patient Care Team: Charlott Rakes, MD as PCP - General (Family Medicine) Debara Pickett Nadean Corwin, MD as PCP - Cardiology (Cardiology) Charlott Rakes, MD (Family Medicine)  Patient Active Problem List   Diagnosis Date Noted  . Acute systolic CHF (congestive heart failure) ([place]) 08/02/2019  . Diarrhea 07/29/2019  . Gastroenteritis 07/22/2019  . Hypomagnesemia 06/19/2019  . Chest pain 06/07/2019  . Elevated troponin 05/23/2019  . Tobacco abuse 05/23/2019  . Alcohol abuse 02/21/2019  . Frequent falls 01/17/2019  . Severe mitral regurgitation   . Fall 11/01/2018  . Hyponatremia 10/31/2018  . Chronic atrial fibrillation   . Chronic hyponatremia 08/16/2018  . NSVT (nonsustained ventricular tachycardia) ([place])   . Concussion with loss of consciousness   . Scalp laceration   . Trauma   . Permanent atrial fibrillation   . Hypertensive heart disease   . Shortness of breath   . Pyogenic inflammation of bone ([place])   . Cirrhosis ([place]) 03/23/2018  . Right ankle pain 03/23/2018  . Syncope 08/07/2017  . Acute on chronic combined systolic and diastolic CHF (congestive heart failure) ([place]) 07/09/2017  . Atrial flutter ([place]) 07/09/2017  . Pre-syncope 07/08/2017  . Neuropathy 05/08/2017  . Substance induced mood disorder ([place]) 10/06/2016  . CVA (cerebral vascular accident) ([place]) 09/18/2016  . Left sided numbness   . Homelessness 08/21/2016  . S/P ORIF (open reduction internal fixation) fracture 08/01/2016  . CAD (coronary artery disease), native coronary artery 07/30/2016  . Surgery, elective   . Insomnia 07/22/2016  . NSTEMI (non-ST elevated myocardial infarction) ([place])   . Anemia 07/05/2016  . Thrombocytopenia ([place]) 07/05/2016  . Cocaine abuse ([place]) 07/02/2016  . Chest pain on breathing 07/01/2016  . Essential hypertension 07/01/2016  . DM type 2 (diabetes mellitus, type 2) ([place])  07/01/2016  . Hypokalemia 07/01/2016  . CKD (chronic kidney disease) stage 3, GFR 30-59 ml/min 07/01/2016  . Painful diabetic neuropathy ([place]) 07/01/2016  . Polysubstance abuse ([place]) 05/27/2016  . Chronic hepatitis C with cirrhosis ([place]) 05/27/2016  . Chronic diastolic congestive heart failure ([place]) 01/31/2016  . Depression 04/21/2012  . GERD (gastroesophageal reflux disease) 02/16/2012  . History of drug abuse ([place])   . Heroin addiction ([place]) 01/29/2012    Current Outpatient Medications:  .  apixaban (ELIQUIS) 5 MG TABS tablet, Take 1 tablet (5 mg total) by mouth 2 (two) times daily., Disp: 60 tablet, Rfl: 6 .  diltiazem (CARDIZEM) 30 MG tablet, Take 1 tablet (30 mg total) by mouth 2 (two) times daily., Disp: 30 tablet, Rfl: 6 .  DULoxetine (CYMBALTA) 60 MG capsule, Take 1 capsule (60 mg total) by mouth daily., Disp: 30 capsule, Rfl: 6 .  gabapentin (NEURONTIN) 300 MG capsule, Take 1 capsule (300 mg total) by mouth 2 (two) times daily., Disp: 30 capsule, Rfl: 6 .  Insulin Glargine (LANTUS SOLOSTAR) 100 UNIT/ML Solostar Pen, Inject 5 Units into the skin at bedtime., Disp: 3 pen, Rfl: 6 .  isosorbide mononitrate (IMDUR) 30 MG 24 hr tablet, Take 1 tablet (30 mg total) by mouth daily., Disp: 30 tablet, Rfl: 6 .  mirtazapine (REMERON) 30 MG tablet, Take 1 tablet (30 mg total) by mouth at bedtime., Disp: 30 tablet, Rfl: 6 .  oxyCODONE-acetaminophen (PERCOCET) 5-325 MG tablet, Take 1 pill every 6 to 8 hours as needed for pain not relieved by Tylenol alone, Disp: 20 tablet, Rfl: 0 .  potassium chloride SA (KLOR-CON) 20 MEQ tablet, Take 1 tablet (20 mEq total) by mouth daily., Disp: 30 tablet, Rfl: 6 .  torsemide (DEMADEX) 20 MG tablet, Take 1 tablet (20 mg total) by mouth 2 (two) times daily., Disp: 60 tablet, Rfl: 6 Allergies  Allergen Reactions  . Angiotensin Receptor Blockers Anaphylaxis and Other (See Comments)    (Angioedema also with Lisinopril, therefore ARB's are contraindicated)  .  Lisinopril Anaphylaxis and Other (See Comments)    Throat swells  . Pamelor [Nortriptyline Hcl] Anaphylaxis and Swelling    Throat swells     Social History   Socioeconomic History  . Marital status: Married    Spouse name: Not on file  . Number of children: 3  . Years of education: 2y college  . Highest education level: Not on file  Occupational History  . 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 Financial resource strain: Not on file  . Food insecurity    Worry: Not on file    Inability: Not on file  . Transportation needs    Medical: Not on file    Non-medical: Not on file  Tobacco Use  . Smoking status: Current Every Day Smoker    Packs/day: 0.50    Years: 28.00    Pack years: 14.00    Types: Cigarettes  . Smokeless tobacco: Never Used  Substance and Sexual Activity  . Alcohol use: Yes    Alcohol/week: 7.0 standard drinks    Types: 7 Cans of beer per week    Comment: "I drink ~ 40oz beer/day"  . Drug use: Yes    Types: IV, Cocaine, Heroin    Comment: 08/17/2018 "no heroin since 2013; I use cocaine ~ once/month, last use 03/21/2019  . Sexual activity: Not Currently  Lifestyle  . Physical activity    Days per week: Not on file    Minutes per session: Not on file  . Stress: Not on file  Relationships  . Social Herbalist on phone: Not on file    Gets together: Not on file    Attends religious service: Not on file    Active member of club or organization: Not on file    Attends meetings of clubs or organizations: Not on file    Relationship status: Not on file  . Intimate partner violence    Fear of current or ex partner: Not on file    Emotionally abused: Not on file    Physically abused: Not on file    Forced sexual activity: Not on file  Other Topics Concern  . Not on file  Social History Narrative   ** Merged History Encounter **       Incarcerated from 2006-2010, then 10/2011-12/2011.  Has been  trying to get sober (no heroin, alcohol since 10/2011).     Physical Exam Vitals signs reviewed.    Arrived for home visit for Brad [name] who was seated in the chair, alert and oriented but appearing sad. Brad [name] reports his son in [place] passed away after being shot and killed. Brad [name] stated he has not taken his medicines today but had them yesterday and all the days before that. Brad [name] was seen by EMS and at [facility] ED Friday for heart palpitations. Dieter reports them still giving him issues. Brad [name] says he isn't short of breath but has some intermittent chest pain with same. Vitals as noted. Brad [name] reports not taking his  insulin due to not having it until Weds per [name] at [facility]. Brad [name] was given 10mg  Melatonin OTC for assisting with his sleep. Brad [name] agreed to home visit next week and will be following up through the week.   CBG- 238   NO MEDS ORDERED      Future Appointments  Date Time Provider [place]  10/10/2019 10:00 AM [facility] PA/NP [facility] None     ACTION: Home visit completed

## 2019-10-03 NOTE — Telephone Encounter (Signed)
Left message for Remond to return my call for home visit today. Will continue to follow.

## 2019-10-07 ENCOUNTER — Telehealth: Payer: Self-pay

## 2019-10-07 NOTE — Telephone Encounter (Signed)
Attempting to reach patient, returning his call.  Messages left on # [phone] and [phone] requesting call back to this CM # [phone]

## 2019-10-10 ENCOUNTER — Telehealth (HOSPITAL_COMMUNITY): Payer: Self-pay

## 2019-10-10 ENCOUNTER — Encounter (HOSPITAL_COMMUNITY): Payer: Medicaid Other

## 2019-10-10 NOTE — Telephone Encounter (Signed)
Brad [name] to remind him of his [facility] Clinic appointment today at 7. VM left, will continue to follow.

## 2019-10-13 ENCOUNTER — Emergency Department (HOSPITAL_COMMUNITY)
Admission: EM | Admit: 2019-10-13 | Discharge: 2019-10-13 | Disposition: A | Discharge status: Home / Self Care | Payer: Medicaid Other | Attending: Emergency Medicine | Admitting: Emergency Medicine

## 2019-10-13 ENCOUNTER — Emergency Department (HOSPITAL_COMMUNITY): Payer: Medicaid Other

## 2019-10-13 ENCOUNTER — Encounter (HOSPITAL_COMMUNITY): Payer: Self-pay | Admitting: Emergency Medicine

## 2019-10-13 DIAGNOSIS — E1122 Type 2 diabetes mellitus with diabetic chronic kidney disease: Secondary | ICD-10-CM | POA: Diagnosis not present

## 2019-10-13 DIAGNOSIS — Z79899 Other long term (current) drug therapy: Secondary | ICD-10-CM | POA: Insufficient documentation

## 2019-10-13 DIAGNOSIS — Z794 Long term (current) use of insulin: Secondary | ICD-10-CM | POA: Diagnosis not present

## 2019-10-13 DIAGNOSIS — I2510 Atherosclerotic heart disease of native coronary artery without angina pectoris: Secondary | ICD-10-CM | POA: Diagnosis not present

## 2019-10-13 DIAGNOSIS — F17210 Nicotine dependence, cigarettes, uncomplicated: Secondary | ICD-10-CM | POA: Insufficient documentation

## 2019-10-13 DIAGNOSIS — I130 Hypertensive heart and chronic kidney disease with heart failure and stage 1 through stage 4 chronic kidney disease, or unspecified chronic kidney disease: Secondary | ICD-10-CM | POA: Diagnosis not present

## 2019-10-13 DIAGNOSIS — R0789 Other chest pain: Secondary | ICD-10-CM | POA: Diagnosis present

## 2019-10-13 DIAGNOSIS — I5042 Chronic combined systolic (congestive) and diastolic (congestive) heart failure: Secondary | ICD-10-CM | POA: Insufficient documentation

## 2019-10-13 DIAGNOSIS — N183 Chronic kidney disease, stage 3 unspecified: Secondary | ICD-10-CM | POA: Insufficient documentation

## 2019-10-13 LAB — CBC
HCT: 42.2 % (ref 39.0–52.0)
Hemoglobin: 14.2 g/dL (ref 13.0–17.0)
MCH: 30.9 pg (ref 26.0–34.0)
MCHC: 33.6 g/dL (ref 30.0–36.0)
MCV: 91.9 fL (ref 80.0–100.0)
Platelets: 202 10*3/uL (ref 150–400)
RBC: 4.59 MIL/uL (ref 4.22–5.81)
RDW: 13.8 % (ref 11.5–15.5)
WBC: 3.6 10*3/uL — ABNORMAL LOW (ref 4.0–10.5)
nRBC: 0 % (ref 0.0–0.2)

## 2019-10-13 LAB — BASIC METABOLIC PANEL
Anion gap: 14 (ref 5–15)
BUN: 21 mg/dL — ABNORMAL HIGH (ref 6–20)
CO2: 25 mmol/L (ref 22–32)
Calcium: 7.6 mg/dL — ABNORMAL LOW (ref 8.9–10.3)
Chloride: 95 mmol/L — ABNORMAL LOW (ref 98–111)
Creatinine, Ser: 1.65 mg/dL — ABNORMAL HIGH (ref 0.61–1.24)
GFR calc Af Amer: 52 mL/min — ABNORMAL LOW (ref >60)
GFR calc non Af Amer: 45 mL/min — ABNORMAL LOW (ref >60)
Glucose, Bld: 161 mg/dL — ABNORMAL HIGH (ref 70–99)
Potassium: 3.2 mmol/L — ABNORMAL LOW (ref 3.5–5.1)
Sodium: 134 mmol/L — ABNORMAL LOW (ref 135–145)

## 2019-10-13 LAB — TROPONIN I (HIGH SENSITIVITY)
Troponin I (High Sensitivity): 51 ng/L — ABNORMAL HIGH (ref <18)
Troponin I (High Sensitivity): 54 ng/L — ABNORMAL HIGH (ref <18)

## 2019-10-13 MED ORDER — HYDROCODONE-ACETAMINOPHEN 5-325 MG PO TABS
1.0000 | ORAL_TABLET | ORAL | Status: AC
  Administered 2019-10-13: 1 via ORAL
  Filled 2019-10-13: qty 1

## 2019-10-13 MED ORDER — SODIUM CHLORIDE 0.9% FLUSH
3.0000 mL | Freq: Once | INTRAVENOUS | Status: AC
  Administered 2019-10-13: 3 mL via INTRAVENOUS

## 2019-10-13 NOTE — ED Notes (Signed)
All appropriate discharge materials reviewed at length with patient. Time for questions provided. Pt has no other questions at this time and verbalizes understanding of all provided materials.  

## 2019-10-13 NOTE — ED Triage Notes (Signed)
Chest pain starting yesterday. Central and sharp intermittent. VSS. Hx of afib and chf. He is currently. In a fib with some PVCs. EMS gave aspirin.

## 2019-10-13 NOTE — Discharge Instructions (Addendum)
Continue your medications.   Follow up with your doctor.  If you are feeling dehydrated you can decrease your evening lasix to 40 mg for the next few days.  Return as needed for worsening symptoms
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
## 2019-10-17 ENCOUNTER — Other Ambulatory Visit: Payer: Self-pay

## 2019-10-17 ENCOUNTER — Other Ambulatory Visit (HOSPITAL_COMMUNITY): Payer: Self-pay

## 2019-10-17 ENCOUNTER — Ambulatory Visit (HOSPITAL_COMMUNITY)
Admission: RE | Admit: 2019-10-17 | Discharge: 2019-10-17 | Disposition: A | Discharge status: Home / Self Care | Payer: Medicaid Other | Source: Ambulatory Visit | Attending: Cardiology | Admitting: Cardiology

## 2019-10-17 ENCOUNTER — Encounter (HOSPITAL_COMMUNITY): Payer: Self-pay

## 2019-10-17 VITALS — BP 138/84 | HR 94 | Wt 178.4 lb

## 2019-10-17 DIAGNOSIS — I482 Chronic atrial fibrillation, unspecified: Secondary | ICD-10-CM | POA: Diagnosis not present

## 2019-10-17 DIAGNOSIS — B1920 Unspecified viral hepatitis C without hepatic coma: Secondary | ICD-10-CM | POA: Insufficient documentation

## 2019-10-17 DIAGNOSIS — Z888 Allergy status to other drugs, medicaments and biological substances status: Secondary | ICD-10-CM | POA: Insufficient documentation

## 2019-10-17 DIAGNOSIS — F329 Major depressive disorder, single episode, unspecified: Secondary | ICD-10-CM | POA: Diagnosis not present

## 2019-10-17 DIAGNOSIS — E1122 Type 2 diabetes mellitus with diabetic chronic kidney disease: Secondary | ICD-10-CM | POA: Diagnosis not present

## 2019-10-17 DIAGNOSIS — Z79899 Other long term (current) drug therapy: Secondary | ICD-10-CM | POA: Insufficient documentation

## 2019-10-17 DIAGNOSIS — K7460 Unspecified cirrhosis of liver: Secondary | ICD-10-CM | POA: Insufficient documentation

## 2019-10-17 DIAGNOSIS — I5032 Chronic diastolic (congestive) heart failure: Secondary | ICD-10-CM

## 2019-10-17 DIAGNOSIS — I110 Hypertensive heart disease with heart failure: Secondary | ICD-10-CM

## 2019-10-17 DIAGNOSIS — I5023 Acute on chronic systolic (congestive) heart failure: Secondary | ICD-10-CM | POA: Diagnosis not present

## 2019-10-17 DIAGNOSIS — F1010 Alcohol abuse, uncomplicated: Secondary | ICD-10-CM | POA: Insufficient documentation

## 2019-10-17 DIAGNOSIS — E785 Hyperlipidemia, unspecified: Secondary | ICD-10-CM | POA: Diagnosis not present

## 2019-10-17 DIAGNOSIS — Z794 Long term (current) use of insulin: Secondary | ICD-10-CM | POA: Diagnosis not present

## 2019-10-17 DIAGNOSIS — F17210 Nicotine dependence, cigarettes, uncomplicated: Secondary | ICD-10-CM | POA: Insufficient documentation

## 2019-10-17 DIAGNOSIS — I480 Paroxysmal atrial fibrillation: Secondary | ICD-10-CM | POA: Diagnosis not present

## 2019-10-17 DIAGNOSIS — Z7901 Long term (current) use of anticoagulants: Secondary | ICD-10-CM | POA: Diagnosis not present

## 2019-10-17 DIAGNOSIS — I5022 Chronic systolic (congestive) heart failure: Secondary | ICD-10-CM

## 2019-10-17 DIAGNOSIS — F1410 Cocaine abuse, uncomplicated: Secondary | ICD-10-CM | POA: Diagnosis not present

## 2019-10-17 DIAGNOSIS — N183 Chronic kidney disease, stage 3 unspecified: Secondary | ICD-10-CM | POA: Insufficient documentation

## 2019-10-17 DIAGNOSIS — I428 Other cardiomyopathies: Secondary | ICD-10-CM | POA: Diagnosis not present

## 2019-10-17 DIAGNOSIS — I130 Hypertensive heart and chronic kidney disease with heart failure and stage 1 through stage 4 chronic kidney disease, or unspecified chronic kidney disease: Secondary | ICD-10-CM | POA: Diagnosis not present

## 2019-10-17 DIAGNOSIS — I4892 Unspecified atrial flutter: Secondary | ICD-10-CM | POA: Insufficient documentation

## 2019-10-17 DIAGNOSIS — Z8546 Personal history of malignant neoplasm of prostate: Secondary | ICD-10-CM | POA: Insufficient documentation

## 2019-10-17 LAB — CBC
HCT: 46.4 % (ref 39.0–52.0)
Hemoglobin: 15.2 g/dL (ref 13.0–17.0)
MCH: 30.7 pg (ref 26.0–34.0)
MCHC: 32.8 g/dL (ref 30.0–36.0)
MCV: 93.7 fL (ref 80.0–100.0)
Platelets: 204 10*3/uL (ref 150–400)
RBC: 4.95 MIL/uL (ref 4.22–5.81)
RDW: 13.9 % (ref 11.5–15.5)
WBC: 5 10*3/uL (ref 4.0–10.5)
nRBC: 0 % (ref 0.0–0.2)

## 2019-10-17 LAB — COMPREHENSIVE METABOLIC PANEL
ALT: 18 U/L (ref 0–44)
AST: 31 U/L (ref 15–41)
Albumin: 2.1 g/dL — ABNORMAL LOW (ref 3.5–5.0)
Alkaline Phosphatase: 64 U/L (ref 38–126)
Anion gap: 13 (ref 5–15)
BUN: 25 mg/dL — ABNORMAL HIGH (ref 6–20)
CO2: 24 mmol/L (ref 22–32)
Calcium: 7.5 mg/dL — ABNORMAL LOW (ref 8.9–10.3)
Chloride: 97 mmol/L — ABNORMAL LOW (ref 98–111)
Creatinine, Ser: 1.55 mg/dL — ABNORMAL HIGH (ref 0.61–1.24)
GFR calc Af Amer: 56 mL/min — ABNORMAL LOW (ref >60)
GFR calc non Af Amer: 48 mL/min — ABNORMAL LOW (ref >60)
Glucose, Bld: 179 mg/dL — ABNORMAL HIGH (ref 70–99)
Potassium: 3.2 mmol/L — ABNORMAL LOW (ref 3.5–5.1)
Sodium: 134 mmol/L — ABNORMAL LOW (ref 135–145)
Total Bilirubin: 0.9 mg/dL (ref 0.3–1.2)
Total Protein: 5.9 g/dL — ABNORMAL LOW (ref 6.5–8.1)

## 2019-10-17 LAB — TSH: TSH: 2.094 u[IU]/mL (ref 0.350–4.500)

## 2019-10-17 MED ORDER — TORSEMIDE 20 MG PO TABS: ORAL_TABLET | ORAL | 6 refills | Status: DC

## 2019-10-17 MED ORDER — POTASSIUM CHLORIDE CRYS ER 20 MEQ PO TBCR: 30.0000 meq | EXTENDED_RELEASE_TABLET | Freq: Every day | ORAL | 6 refills | Status: DC

## 2019-10-17 NOTE — Patient Instructions (Signed)
INCREASE [place] to 80 mg (4 tabs) in the AM and 60 mg (3 tabs) in the PM\ INCREASE Potassium to 30 MEQ, one and one half tab daily  Labs today We will only contact you if something comes back abnormal or we need to make some changes. Otherwise no news is good news!  You have been referred to [facility] [phone]  Your physician recommends that you schedule a follow-up appointment in: 2 weeks with Dr Aundra Dubin  Do the following things EVERYDAY: 1) Weigh yourself in the morning before breakfast. Write it down and keep it in a log. 2) Take your medicines as prescribed 3) Eat low salt foods-Limit salt (sodium) to 2000 mg per day.  4) Stay as active as you can everyday 5) Limit all fluids for the day to less than 2 liters  At the [facility], you and your health needs are our priority. As part of our continuing mission to provide you with exceptional heart care, we have created designated Provider Care Teams. These Care Teams include your primary Cardiologist (physician) and Advanced Practice Providers (APPs- Physician Assistants and Nurse Practitioners) who all work together to provide you with the care you need, when you need it.   You may see any of the following providers on your designated Care Team at your next follow up: Marland Kitchen Dr Glori Bickers . Dr Loralie Champagne . Darrick Grinder, NP . Lyda Jester, PA   Please be sure to bring in all your medications bottles to every appointment.
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
## 2019-10-17 NOTE — Progress Notes (Signed)
Pill box filled accordingly to new medication changes.

## 2019-10-18 ENCOUNTER — Telehealth: Payer: Self-pay

## 2019-10-18 NOTE — Telephone Encounter (Signed)
Call received from patient.  He wanted to let Dr Margarita Rana know that the melatonin has not been working to help him sleep. He said he has taken up to 20 mg nightly.  He is requesting amitriptyline stating that it has helped in the past.

## 2019-10-19 ENCOUNTER — Telehealth (HOSPITAL_COMMUNITY): Payer: Self-pay

## 2019-10-19 MED ORDER — AMITRIPTYLINE HCL 10 MG PO TABS: 10.0000 mg | ORAL_TABLET | Freq: Every day | ORAL | 3 refills | Status: DC

## 2019-10-19 NOTE — Telephone Encounter (Signed)
Call placed to patient and informed him that Dr Margarita Rana ordered the amitriptyline and it was sent to his pharmacy.  He then said that he needs more insulin and the pharmacy is not able to dispense without a new order.  Call placed to [facility], spoke to [place], [facility] about the insulin.  He said that the order for lantus 5 units daily was received 07/04/2019 and the  patient was given 1 box of lantus pens : 5 pens each with 300 units. [name] said that he is not allowed to split the box of insulin so he needed to give the patient the 5 pens.  He said that he explained to the patient how much insulin he was given and taking 5 units daily, this should last him for many months.  He has not received any change of dosing since then. The order that he has remains 5 units daily. The patient has lost multiple insulin pens when he has changed residences. He is homeless without a permanent address and is currently staying with a friend.  [name] said that he would have 1 lantus pen  - 300 units for patient to pick up and this should last 60 days taking 5 units daily. He said that he would explain this to the patient or Nira Conn, whoever picks this up for patient.  He also noted that the amitriptyline is ready for pick up.    Call placed to patient. He said that he just went to the pharmacy and picked up the insulin and amitriptyline and [name] explained to him again how to use the insulin pen.

## 2019-10-19 NOTE — Telephone Encounter (Signed)
Tarran contacted me in reference to needing an approval letter from his cardiologist to be cleared to donate plasma. I contacted Tammy Sours reference same who stated she would reach out to Dr. Aundra Dubin and they would fax same to plasma center.

## 2019-10-19 NOTE — Telephone Encounter (Signed)
I have sent a prescription for amitriptyline to his pharmacy and I have discontinued Remeron since it is ineffective

## 2019-10-19 NOTE — Telephone Encounter (Signed)
Call received from patient inquiring why he was prescribed  amitriptyline 10 mg instead of 75 mg that has been prescribed in the past.  He said that 10 mg will not help him sleep. Informed him that Dr Margarita Rana would be notified.

## 2019-10-20 ENCOUNTER — Telehealth (HOSPITAL_COMMUNITY): Payer: Self-pay | Admitting: Licensed Clinical Social Worker

## 2019-10-20 NOTE — Telephone Encounter (Signed)
Noted. Amitriptyline interacts with one of his medications. He can start with this dose and any further discussion would need to occur during a visit virtual or in person. Thanks.

## 2019-10-20 NOTE — Telephone Encounter (Signed)
CSW received call from [employer] asking if it was ok for him to donate plasma.  CSW inquired with APP who saw him Monday- due to multiple concerns during that visit and medication adjustments does not think its a good idea to donate plasma until he is seen in the office again and is in more stable condition.  CSW called pt and left message to inform- also called Clinical biochemist and informed.  CSW will continue to follow and assist as needed  Brad [name], [facility] Desk#: [phone] Cell#: [phone]

## 2019-10-21 NOTE — Telephone Encounter (Signed)
Call placed to patient to inform him of note from Dr Margarita Rana regarding amitriptyline.  Message left with call back requested to this CM # [phone].

## 2019-10-24 ENCOUNTER — Emergency Department (HOSPITAL_COMMUNITY)
Admission: EM | Admit: 2019-10-24 | Discharge: 2019-10-24 | Disposition: A | Discharge status: Home / Self Care | Payer: Medicaid Other | Attending: Emergency Medicine | Admitting: Emergency Medicine

## 2019-10-24 ENCOUNTER — Other Ambulatory Visit: Payer: Self-pay

## 2019-10-24 ENCOUNTER — Ambulatory Visit (HOSPITAL_COMMUNITY)
Admission: RE | Admit: 2019-10-24 | Discharge: 2019-10-24 | Disposition: A | Discharge status: Home / Self Care | Payer: Medicaid Other | Source: Ambulatory Visit | Attending: Physician Assistant | Admitting: Physician Assistant

## 2019-10-24 ENCOUNTER — Emergency Department (HOSPITAL_COMMUNITY): Payer: Medicaid Other

## 2019-10-24 ENCOUNTER — Encounter (HOSPITAL_COMMUNITY): Payer: Self-pay | Admitting: Emergency Medicine

## 2019-10-24 ENCOUNTER — Other Ambulatory Visit (HOSPITAL_COMMUNITY): Payer: Self-pay

## 2019-10-24 ENCOUNTER — Encounter (HOSPITAL_COMMUNITY): Payer: Self-pay | Admitting: Physician Assistant

## 2019-10-24 VITALS — BP 102/70 | HR 96 | Ht 71.0 in | Wt 183.8 lb

## 2019-10-24 DIAGNOSIS — Z79899 Other long term (current) drug therapy: Secondary | ICD-10-CM | POA: Diagnosis not present

## 2019-10-24 DIAGNOSIS — I4819 Other persistent atrial fibrillation: Secondary | ICD-10-CM | POA: Insufficient documentation

## 2019-10-24 DIAGNOSIS — F17210 Nicotine dependence, cigarettes, uncomplicated: Secondary | ICD-10-CM | POA: Insufficient documentation

## 2019-10-24 DIAGNOSIS — I4821 Permanent atrial fibrillation: Secondary | ICD-10-CM | POA: Insufficient documentation

## 2019-10-24 DIAGNOSIS — R0789 Other chest pain: Secondary | ICD-10-CM | POA: Insufficient documentation

## 2019-10-24 DIAGNOSIS — I509 Heart failure, unspecified: Secondary | ICD-10-CM

## 2019-10-24 DIAGNOSIS — I5043 Acute on chronic combined systolic (congestive) and diastolic (congestive) heart failure: Secondary | ICD-10-CM | POA: Diagnosis not present

## 2019-10-24 DIAGNOSIS — I110 Hypertensive heart disease with heart failure: Secondary | ICD-10-CM

## 2019-10-24 DIAGNOSIS — I130 Hypertensive heart and chronic kidney disease with heart failure and stage 1 through stage 4 chronic kidney disease, or unspecified chronic kidney disease: Secondary | ICD-10-CM | POA: Insufficient documentation

## 2019-10-24 DIAGNOSIS — Z7901 Long term (current) use of anticoagulants: Secondary | ICD-10-CM | POA: Insufficient documentation

## 2019-10-24 DIAGNOSIS — I5042 Chronic combined systolic (congestive) and diastolic (congestive) heart failure: Secondary | ICD-10-CM | POA: Diagnosis not present

## 2019-10-24 DIAGNOSIS — R079 Chest pain, unspecified: Secondary | ICD-10-CM | POA: Insufficient documentation

## 2019-10-24 DIAGNOSIS — R0602 Shortness of breath: Secondary | ICD-10-CM | POA: Diagnosis present

## 2019-10-24 DIAGNOSIS — N183 Chronic kidney disease, stage 3 unspecified: Secondary | ICD-10-CM | POA: Insufficient documentation

## 2019-10-24 DIAGNOSIS — F329 Major depressive disorder, single episode, unspecified: Secondary | ICD-10-CM | POA: Diagnosis not present

## 2019-10-24 DIAGNOSIS — Z8546 Personal history of malignant neoplasm of prostate: Secondary | ICD-10-CM | POA: Diagnosis not present

## 2019-10-24 DIAGNOSIS — E1122 Type 2 diabetes mellitus with diabetic chronic kidney disease: Secondary | ICD-10-CM | POA: Insufficient documentation

## 2019-10-24 LAB — BASIC METABOLIC PANEL
Anion gap: 12 (ref 5–15)
BUN: 29 mg/dL — ABNORMAL HIGH (ref 6–20)
CO2: 20 mmol/L — ABNORMAL LOW (ref 22–32)
Calcium: 7.8 mg/dL — ABNORMAL LOW (ref 8.9–10.3)
Chloride: 101 mmol/L (ref 98–111)
Creatinine, Ser: 1.66 mg/dL — ABNORMAL HIGH (ref 0.61–1.24)
GFR calc Af Amer: 52 mL/min — ABNORMAL LOW (ref >60)
GFR calc non Af Amer: 44 mL/min — ABNORMAL LOW (ref >60)
Glucose, Bld: 148 mg/dL — ABNORMAL HIGH (ref 70–99)
Potassium: 4.4 mmol/L (ref 3.5–5.1)
Sodium: 133 mmol/L — ABNORMAL LOW (ref 135–145)

## 2019-10-24 LAB — CBC
HCT: 41.2 % (ref 39.0–52.0)
Hemoglobin: 13.5 g/dL (ref 13.0–17.0)
MCH: 30.1 pg (ref 26.0–34.0)
MCHC: 32.8 g/dL (ref 30.0–36.0)
MCV: 92 fL (ref 80.0–100.0)
Platelets: 275 10*3/uL (ref 150–400)
RBC: 4.48 MIL/uL (ref 4.22–5.81)
RDW: 13.9 % (ref 11.5–15.5)
WBC: 3.9 10*3/uL — ABNORMAL LOW (ref 4.0–10.5)
nRBC: 0 % (ref 0.0–0.2)

## 2019-10-24 LAB — BRAIN NATRIURETIC PEPTIDE: B Natriuretic Peptide: 384.8 pg/mL — ABNORMAL HIGH (ref 0.0–100.0)

## 2019-10-24 LAB — TROPONIN I (HIGH SENSITIVITY)
Troponin I (High Sensitivity): 35 ng/L — ABNORMAL HIGH (ref <18)
Troponin I (High Sensitivity): 48 ng/L — ABNORMAL HIGH (ref <18)

## 2019-10-24 MED ORDER — FUROSEMIDE 10 MG/ML IJ SOLN
40.0000 mg | Freq: Once | INTRAMUSCULAR | Status: AC
  Administered 2019-10-24: 40 mg via INTRAVENOUS
  Filled 2019-10-24: qty 4

## 2019-10-24 MED ORDER — SODIUM CHLORIDE 0.9% FLUSH: 3.0000 mL | Freq: Once | INTRAVENOUS | Status: DC

## 2019-10-24 MED ORDER — HYDROCODONE-ACETAMINOPHEN 5-325 MG PO TABS
1.0000 | ORAL_TABLET | Freq: Once | ORAL | Status: AC
  Administered 2019-10-24: 1 via ORAL
  Filled 2019-10-24: qty 1

## 2019-10-24 NOTE — Discharge Instructions (Signed)
Follow-up with heart failure clinic as discussed.  Increase your torsemide to 80 mg twice a day.  Please continue to avoid alcohol and drugs.
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
## 2019-10-24 NOTE — ED Triage Notes (Addendum)
C/o increased swelling in legs and shortness of breath-- 2-3+ pitting edema in lower legs, worse in left leg, came from A fib clinic  With an ekg.

## 2019-10-24 NOTE — ED Notes (Signed)
Lab to add on troponin  

## 2019-10-25 ENCOUNTER — Telehealth: Payer: Self-pay

## 2019-10-25 ENCOUNTER — Other Ambulatory Visit (HOSPITAL_COMMUNITY): Payer: Self-pay

## 2019-10-25 NOTE — Progress Notes (Signed)
[name], EMT-P that sees him weekly called me as [name] called her regarding pt calling into provider office stating he is not feeling well, was seen in the ER yesterday and still has swelling to his legs. Nira Conn is not working [facility] today so I was able to go by and follow up, however nobody came to door and he did not answer the phone when I called.  He does have f/u appoint at [facility] on Thursday.   Marylouise Stacks, EMT-Paramedic 10/25/19

## 2019-10-25 NOTE — Telephone Encounter (Signed)
Call received from patient, he explained that he was seen in the ED yesterday and was sent home.  He said that his legs remain edematous, the edema has not decreased or increased since yesterday and his legs hurt. He said that he has been taking torsemide 80 mg twice daily instead of 80 mg in the morning and 60 mg in the evening as noted on his AVS and has not experienced any increased urination. Provided him with the phone for the heart failure clinic and instructed him to call to report his symptoms. He denied any difficulty breathing.   He also noted that he wants to talk to Dr Margarita Rana about the dose of amitriptyline. Scheduled him for appointment 11/09/2019.

## 2019-10-27 ENCOUNTER — Encounter (HOSPITAL_COMMUNITY): Payer: Medicaid Other

## 2019-10-27 ENCOUNTER — Telehealth: Payer: Self-pay

## 2019-10-27 NOTE — Telephone Encounter (Signed)
Call received from the patient. He said that the edema in his legs is decreasing but now he is having swelling around his eyes and chin. Denied any problems breathing or swallowing.  He said that he thinks this is due to the increased torsemide and said that he doesn't think it is due to an allergy. Instructed him to call the HF clinic.    Call received from patient again and he said that he left a message with HF clinic. He has an appt there on 10/31/2019.  There was also an appt scheduled for him there today but he said he didn't plan to go today because he didn't need 2 appts so close together.  Transportation has been arranged for the appt 10/31/2019

## 2019-10-28 ENCOUNTER — Telehealth (HOSPITAL_COMMUNITY): Payer: Self-pay | Admitting: *Deleted

## 2019-10-28 NOTE — Telephone Encounter (Signed)
Pt left vm stating his face was swollen since medication increase. Called pt to get more information. No answer/left vm.

## 2019-10-31 ENCOUNTER — Ambulatory Visit (HOSPITAL_COMMUNITY)
Admission: RE | Admit: 2019-10-31 | Discharge: 2019-10-31 | Disposition: A | Discharge status: Home / Self Care | Payer: Medicaid Other | Source: Ambulatory Visit | Attending: Cardiology | Admitting: Cardiology

## 2019-10-31 ENCOUNTER — Encounter (HOSPITAL_COMMUNITY): Payer: Self-pay | Admitting: Cardiology

## 2019-10-31 ENCOUNTER — Inpatient Hospital Stay (HOSPITAL_COMMUNITY): Payer: Medicaid Other

## 2019-10-31 ENCOUNTER — Inpatient Hospital Stay: Payer: Self-pay

## 2019-10-31 ENCOUNTER — Inpatient Hospital Stay (HOSPITAL_COMMUNITY)
Admission: AD | Admit: 2019-10-31 | Discharge: 2019-11-05 | DRG: 291 | Disposition: A | Discharge status: Home / Self Care | Payer: Medicaid Other | Source: Ambulatory Visit | Attending: Cardiology | Admitting: Cardiology

## 2019-10-31 ENCOUNTER — Encounter (HOSPITAL_COMMUNITY): Payer: Self-pay

## 2019-10-31 ENCOUNTER — Other Ambulatory Visit: Payer: Self-pay

## 2019-10-31 VITALS — BP 120/92 | HR 106 | Wt 176.0 lb

## 2019-10-31 DIAGNOSIS — Z20828 Contact with and (suspected) exposure to other viral communicable diseases: Secondary | ICD-10-CM | POA: Diagnosis present

## 2019-10-31 DIAGNOSIS — E1122 Type 2 diabetes mellitus with diabetic chronic kidney disease: Secondary | ICD-10-CM | POA: Diagnosis present

## 2019-10-31 DIAGNOSIS — Z8249 Family history of ischemic heart disease and other diseases of the circulatory system: Secondary | ICD-10-CM

## 2019-10-31 DIAGNOSIS — I340 Nonrheumatic mitral (valve) insufficiency: Secondary | ICD-10-CM | POA: Diagnosis present

## 2019-10-31 DIAGNOSIS — F17210 Nicotine dependence, cigarettes, uncomplicated: Secondary | ICD-10-CM | POA: Diagnosis present

## 2019-10-31 DIAGNOSIS — Z8546 Personal history of malignant neoplasm of prostate: Secondary | ICD-10-CM

## 2019-10-31 DIAGNOSIS — F1410 Cocaine abuse, uncomplicated: Secondary | ICD-10-CM | POA: Diagnosis present

## 2019-10-31 DIAGNOSIS — B1920 Unspecified viral hepatitis C without hepatic coma: Secondary | ICD-10-CM | POA: Diagnosis present

## 2019-10-31 DIAGNOSIS — N183 Chronic kidney disease, stage 3 unspecified: Secondary | ICD-10-CM | POA: Diagnosis present

## 2019-10-31 DIAGNOSIS — I493 Ventricular premature depolarization: Secondary | ICD-10-CM | POA: Diagnosis not present

## 2019-10-31 DIAGNOSIS — I472 Ventricular tachycardia: Secondary | ICD-10-CM | POA: Diagnosis not present

## 2019-10-31 DIAGNOSIS — Z79899 Other long term (current) drug therapy: Secondary | ICD-10-CM | POA: Diagnosis not present

## 2019-10-31 DIAGNOSIS — K7460 Unspecified cirrhosis of liver: Secondary | ICD-10-CM | POA: Diagnosis present

## 2019-10-31 DIAGNOSIS — F329 Major depressive disorder, single episode, unspecified: Secondary | ICD-10-CM | POA: Diagnosis present

## 2019-10-31 DIAGNOSIS — Z95828 Presence of other vascular implants and grafts: Secondary | ICD-10-CM

## 2019-10-31 DIAGNOSIS — E785 Hyperlipidemia, unspecified: Secondary | ICD-10-CM | POA: Diagnosis present

## 2019-10-31 DIAGNOSIS — I110 Hypertensive heart disease with heart failure: Secondary | ICD-10-CM

## 2019-10-31 DIAGNOSIS — I4891 Unspecified atrial fibrillation: Secondary | ICD-10-CM

## 2019-10-31 DIAGNOSIS — I5032 Chronic diastolic (congestive) heart failure: Secondary | ICD-10-CM

## 2019-10-31 DIAGNOSIS — I5022 Chronic systolic (congestive) heart failure: Secondary | ICD-10-CM | POA: Diagnosis present

## 2019-10-31 DIAGNOSIS — Z888 Allergy status to other drugs, medicaments and biological substances status: Secondary | ICD-10-CM

## 2019-10-31 DIAGNOSIS — I5023 Acute on chronic systolic (congestive) heart failure: Secondary | ICD-10-CM | POA: Diagnosis present

## 2019-10-31 DIAGNOSIS — F1010 Alcohol abuse, uncomplicated: Secondary | ICD-10-CM | POA: Diagnosis present

## 2019-10-31 DIAGNOSIS — I130 Hypertensive heart and chronic kidney disease with heart failure and stage 1 through stage 4 chronic kidney disease, or unspecified chronic kidney disease: Principal | ICD-10-CM | POA: Diagnosis present

## 2019-10-31 DIAGNOSIS — I509 Heart failure, unspecified: Secondary | ICD-10-CM

## 2019-10-31 DIAGNOSIS — I248 Other forms of acute ischemic heart disease: Secondary | ICD-10-CM | POA: Diagnosis present

## 2019-10-31 DIAGNOSIS — I5043 Acute on chronic combined systolic (congestive) and diastolic (congestive) heart failure: Secondary | ICD-10-CM | POA: Diagnosis not present

## 2019-10-31 DIAGNOSIS — I4819 Other persistent atrial fibrillation: Secondary | ICD-10-CM | POA: Diagnosis present

## 2019-10-31 DIAGNOSIS — I428 Other cardiomyopathies: Secondary | ICD-10-CM | POA: Diagnosis present

## 2019-10-31 DIAGNOSIS — N179 Acute kidney failure, unspecified: Secondary | ICD-10-CM | POA: Diagnosis not present

## 2019-10-31 DIAGNOSIS — E1140 Type 2 diabetes mellitus with diabetic neuropathy, unspecified: Secondary | ICD-10-CM

## 2019-10-31 DIAGNOSIS — R0602 Shortness of breath: Secondary | ICD-10-CM | POA: Diagnosis present

## 2019-10-31 LAB — SARS CORONAVIRUS 2 (TAT 6-24 HRS): SARS Coronavirus 2: NEGATIVE

## 2019-10-31 LAB — COMPREHENSIVE METABOLIC PANEL
ALT: 24 U/L (ref 0–44)
AST: 33 U/L (ref 15–41)
Albumin: 1.9 g/dL — ABNORMAL LOW (ref 3.5–5.0)
Alkaline Phosphatase: 61 U/L (ref 38–126)
Anion gap: 13 (ref 5–15)
BUN: 14 mg/dL (ref 6–20)
CO2: 22 mmol/L (ref 22–32)
Calcium: 7.7 mg/dL — ABNORMAL LOW (ref 8.9–10.3)
Chloride: 101 mmol/L (ref 98–111)
Creatinine, Ser: 1.29 mg/dL — ABNORMAL HIGH (ref 0.61–1.24)
GFR calc Af Amer: >60 mL/min (ref >60)
GFR calc non Af Amer: >60 mL/min (ref >60)
Glucose, Bld: 118 mg/dL — ABNORMAL HIGH (ref 70–99)
Potassium: 3.4 mmol/L — ABNORMAL LOW (ref 3.5–5.1)
Sodium: 136 mmol/L (ref 135–145)
Total Bilirubin: 0.9 mg/dL (ref 0.3–1.2)
Total Protein: 5.5 g/dL — ABNORMAL LOW (ref 6.5–8.1)

## 2019-10-31 LAB — CBC
HCT: 46.8 % (ref 39.0–52.0)
Hemoglobin: 15.4 g/dL (ref 13.0–17.0)
MCH: 30.6 pg (ref 26.0–34.0)
MCHC: 32.9 g/dL (ref 30.0–36.0)
MCV: 93 fL (ref 80.0–100.0)
Platelets: 231 10*3/uL (ref 150–400)
RBC: 5.03 MIL/uL (ref 4.22–5.81)
RDW: 14.1 % (ref 11.5–15.5)
WBC: 3.4 10*3/uL — ABNORMAL LOW (ref 4.0–10.5)
nRBC: 0 % (ref 0.0–0.2)

## 2019-10-31 LAB — BRAIN NATRIURETIC PEPTIDE: B Natriuretic Peptide: 679.1 pg/mL — ABNORMAL HIGH (ref 0.0–100.0)

## 2019-10-31 LAB — HIV ANTIBODY (ROUTINE TESTING W REFLEX): HIV Screen 4th Generation wRfx: NONREACTIVE

## 2019-10-31 LAB — TROPONIN I (HIGH SENSITIVITY): Troponin I (High Sensitivity): 35 ng/L — ABNORMAL HIGH (ref <18)

## 2019-10-31 MED ORDER — CARVEDILOL 3.125 MG PO TABS
3.1250 mg | ORAL_TABLET | Freq: Two times a day (BID) | ORAL | Status: DC
  Administered 2019-10-31 – 2019-11-01 (×2): 3.125 mg via ORAL
  Filled 2019-10-31 (×2): qty 1

## 2019-10-31 MED ORDER — SODIUM CHLORIDE 0.9% FLUSH: 3.0000 mL | INTRAVENOUS | Status: DC | PRN

## 2019-10-31 MED ORDER — POTASSIUM CHLORIDE CRYS ER 20 MEQ PO TBCR
40.0000 meq | EXTENDED_RELEASE_TABLET | Freq: Every day | ORAL | Status: DC
  Administered 2019-10-31 – 2019-11-01 (×2): 40 meq via ORAL
  Filled 2019-10-31 (×2): qty 2

## 2019-10-31 MED ORDER — SODIUM CHLORIDE 0.9% FLUSH
3.0000 mL | Freq: Two times a day (BID) | INTRAVENOUS | Status: DC
  Administered 2019-11-01 – 2019-11-05 (×7): 3 mL via INTRAVENOUS

## 2019-10-31 MED ORDER — AMIODARONE HCL IN DEXTROSE 360-4.14 MG/200ML-% IV SOLN
60.0000 mg/h | INTRAVENOUS | Status: AC
  Administered 2019-10-31 (×2): 60 mg/h via INTRAVENOUS
  Filled 2019-10-31 (×2): qty 200

## 2019-10-31 MED ORDER — APIXABAN 5 MG PO TABS
5.0000 mg | ORAL_TABLET | Freq: Two times a day (BID) | ORAL | Status: DC
  Administered 2019-10-31 – 2019-11-05 (×10): 5 mg via ORAL
  Filled 2019-10-31 (×10): qty 1

## 2019-10-31 MED ORDER — SODIUM CHLORIDE 0.9 % IV SOLN
250.0000 mL | INTRAVENOUS | Status: DC | PRN
  Administered 2019-11-01: 250 mL via INTRAVENOUS

## 2019-10-31 MED ORDER — SODIUM CHLORIDE 0.9 % IV SOLN: 250.0000 mL | INTRAVENOUS | Status: DC

## 2019-10-31 MED ORDER — DULOXETINE HCL 60 MG PO CPEP
60.0000 mg | ORAL_CAPSULE | Freq: Every day | ORAL | Status: DC
  Administered 2019-11-01 – 2019-11-05 (×5): 60 mg via ORAL
  Filled 2019-10-31 (×5): qty 1

## 2019-10-31 MED ORDER — AMIODARONE HCL IN DEXTROSE 360-4.14 MG/200ML-% IV SOLN
30.0000 mg/h | INTRAVENOUS | Status: DC
  Administered 2019-11-01 – 2019-11-04 (×10): 30 mg/h via INTRAVENOUS
  Filled 2019-10-31 (×8): qty 200

## 2019-10-31 MED ORDER — GABAPENTIN 300 MG PO CAPS
300.0000 mg | ORAL_CAPSULE | Freq: Two times a day (BID) | ORAL | Status: DC
  Administered 2019-10-31 – 2019-11-02 (×4): 300 mg via ORAL
  Filled 2019-10-31 (×4): qty 1

## 2019-10-31 MED ORDER — AMIODARONE LOAD VIA INFUSION
150.0000 mg | Freq: Once | INTRAVENOUS | Status: AC
  Administered 2019-10-31: 150 mg via INTRAVENOUS
  Filled 2019-10-31: qty 83.34

## 2019-10-31 MED ORDER — CHLORHEXIDINE GLUCONATE CLOTH 2 % EX PADS
6.0000 | MEDICATED_PAD | Freq: Every day | CUTANEOUS | Status: DC
  Administered 2019-11-01 – 2019-11-05 (×5): 6 via TOPICAL

## 2019-10-31 MED ORDER — SODIUM CHLORIDE 0.9% FLUSH: 10.0000 mL | INTRAVENOUS | Status: DC | PRN

## 2019-10-31 MED ORDER — FUROSEMIDE 10 MG/ML IJ SOLN
80.0000 mg | Freq: Two times a day (BID) | INTRAMUSCULAR | Status: DC
  Administered 2019-10-31 – 2019-11-01 (×2): 80 mg via INTRAVENOUS
  Filled 2019-10-31 (×2): qty 8

## 2019-10-31 MED ORDER — POTASSIUM CHLORIDE CRYS ER 20 MEQ PO TBCR
40.0000 meq | EXTENDED_RELEASE_TABLET | Freq: Once | ORAL | Status: AC
  Administered 2019-10-31: 40 meq via ORAL
  Filled 2019-10-31: qty 2

## 2019-10-31 MED ORDER — ACETAMINOPHEN 325 MG PO TABS
650.0000 mg | ORAL_TABLET | ORAL | Status: DC | PRN
  Administered 2019-10-31 – 2019-11-04 (×3): 650 mg via ORAL
  Filled 2019-10-31 (×2): qty 2

## 2019-10-31 MED ORDER — ISOSORB DINITRATE-HYDRALAZINE 20-37.5 MG PO TABS
0.5000 | ORAL_TABLET | Freq: Three times a day (TID) | ORAL | Status: DC
  Administered 2019-10-31 – 2019-11-03 (×11): 0.5 via ORAL
  Filled 2019-10-31 (×11): qty 1

## 2019-10-31 MED ORDER — SODIUM CHLORIDE 0.9% FLUSH: 3.0000 mL | Freq: Two times a day (BID) | INTRAVENOUS | Status: DC

## 2019-10-31 NOTE — Progress Notes (Signed)
  Amiodarone Drug - Drug Interaction Consult Note  Recommendations: -No DDIs identified, monitor HR with carvedilol  Amiodarone is metabolized by the cytochrome P450 system and therefore has the potential to cause many drug interactions. Amiodarone has an average plasma half-life of 50 days (range 20 to 100 days).   There is potential for drug interactions to occur several weeks or months after stopping treatment and the onset of drug interactions may be slow after initiating amiodarone.   []  Statins: Increased risk of myopathy. Simvastatin- restrict dose to 20mg  daily. Other statins: counsel patients to report any muscle pain or weakness immediately.  []  Anticoagulants: Amiodarone can increase anticoagulant effect. Consider warfarin dose reduction. Patients should be monitored closely and the dose of anticoagulant altered accordingly, remembering that amiodarone levels take several weeks to stabilize.  []  Antiepileptics: Amiodarone can increase plasma concentration of phenytoin, the dose should be reduced. Note that small changes in phenytoin dose can result in large changes in levels. Monitor patient and counsel on signs of toxicity.  []  Beta blockers: increased risk of bradycardia, AV block and myocardial depression. Sotalol - avoid concomitant use.  []   Calcium channel blockers (diltiazem and verapamil): increased risk of bradycardia, AV block and myocardial depression.  []   Cyclosporine: Amiodarone increases levels of cyclosporine. Reduced dose of cyclosporine is recommended.  []  Digoxin dose should be halved when amiodarone is started.  []  Diuretics: increased risk of cardiotoxicity if hypokalemia occurs.  []  Oral hypoglycemic agents (glyburide, glipizide, glimepiride): increased risk of hypoglycemia. Patient's glucose levels should be monitored closely when initiating amiodarone therapy.   []  Drugs that prolong the QT interval:  Torsades de pointes risk may be increased with  concurrent use - avoid if possible.  Monitor QTc, also keep magnesium/potassium WNL if concurrent therapy can't be avoided. Marland Kitchen Antibiotics: e.g. fluoroquinolones, erythromycin. . Antiarrhythmics: e.g. quinidine, procainamide, disopyramide, sotalol. . Antipsychotics: e.g. phenothiazines, haloperidol.  . Lithium, tricyclic antidepressants, and methadone. Thank Concha Pyo  10/31/2019 4:07 PM
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
## 2019-10-31 NOTE — Progress Notes (Signed)
Pt planned for admission from office per Dr Aundra Dubin.  Report called to nurse on [place].

## 2019-10-31 NOTE — Progress Notes (Signed)
   Called by staff nurse.   Unable to obtain IV. Order placed for PICC line and CVP monitoring.   Amy Clegg NP-C  5:30 PM
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
## 2019-10-31 NOTE — Progress Notes (Signed)
Peripherally Inserted Central Catheter/Midline Placement  The IV Nurse has discussed with the patient and/or persons authorized to consent for the patient, the purpose of this procedure and the potential benefits and risks involved with this procedure.  The benefits include less needle sticks, lab draws from the catheter, and the patient may be discharged home with the catheter. Risks include, but not limited to, infection, bleeding, blood clot (thrombus formation), and puncture of an artery; nerve damage and irregular heartbeat and possibility to perform a PICC exchange if needed/ordered by physician.  Alternatives to this procedure were also discussed.  B[employer] Power PICC patient education guide, fact sheet on infection prevention and patient information card has been provided to patient /or left at bedside.    PICC/Midline Placement Documentation  PICC Double Lumen 0000000 PICC Right Basilic 38 cm 0 cm (Active)  Indication for Insertion or Continuance of Line Vasoactive infusions 10/31/19 1836  Exposed Catheter (cm) 0 cm 10/31/19 1836  Site Assessment Clean;Intact;Dry 10/31/19 1836  Lumen #1 Status Flushed;Blood return noted 10/31/19 1836  Lumen #2 Status Flushed;Blood return noted 10/31/19 1836  Dressing Type Transparent 10/31/19 1836  Dressing Status Clean;Dry;Intact;Antimicrobial disc in place;Other (Comment) 10/31/19 1836  Dressing Intervention New dressing 10/31/19 1836  Dressing Change Due 11/07/19 10/31/19 1836       Christella Noa Albarece 10/31/2019, 6:37 PM

## 2019-11-01 ENCOUNTER — Inpatient Hospital Stay (HOSPITAL_COMMUNITY): Payer: Medicaid Other | Admitting: Certified Registered Nurse Anesthetist

## 2019-11-01 ENCOUNTER — Encounter (HOSPITAL_COMMUNITY): Payer: Self-pay | Admitting: *Deleted

## 2019-11-01 ENCOUNTER — Encounter (HOSPITAL_COMMUNITY)
Admission: AD | Disposition: A | Discharge status: Home / Self Care | Payer: Self-pay | Source: Ambulatory Visit | Attending: Cardiology

## 2019-11-01 DIAGNOSIS — I4819 Other persistent atrial fibrillation: Secondary | ICD-10-CM

## 2019-11-01 DIAGNOSIS — I5023 Acute on chronic systolic (congestive) heart failure: Secondary | ICD-10-CM

## 2019-11-01 HISTORY — PX: CARDIOVERSION: SHX1299

## 2019-11-01 LAB — BASIC METABOLIC PANEL
Anion gap: 10 (ref 5–15)
BUN: 23 mg/dL — ABNORMAL HIGH (ref 6–20)
CO2: 27 mmol/L (ref 22–32)
Calcium: 7.9 mg/dL — ABNORMAL LOW (ref 8.9–10.3)
Chloride: 97 mmol/L — ABNORMAL LOW (ref 98–111)
Creatinine, Ser: 1.81 mg/dL — ABNORMAL HIGH (ref 0.61–1.24)
GFR calc Af Amer: 46 mL/min — ABNORMAL LOW (ref >60)
GFR calc non Af Amer: 40 mL/min — ABNORMAL LOW (ref >60)
Glucose, Bld: 150 mg/dL — ABNORMAL HIGH (ref 70–99)
Potassium: 3.5 mmol/L (ref 3.5–5.1)
Sodium: 134 mmol/L — ABNORMAL LOW (ref 135–145)

## 2019-11-01 LAB — COOXEMETRY PANEL
Carboxyhemoglobin: 1.7 % — ABNORMAL HIGH (ref 0.5–1.5)
Carboxyhemoglobin: 1.9 % — ABNORMAL HIGH (ref 0.5–1.5)
Methemoglobin: 0.5 % (ref 0.0–1.5)
Methemoglobin: 0.9 % (ref 0.0–1.5)
O2 Saturation: 49 %
O2 Saturation: 64.4 %
Total hemoglobin: 14 g/dL (ref 12.0–16.0)
Total hemoglobin: 13.2 g/dL (ref 12.0–16.0)

## 2019-11-01 LAB — GLUCOSE, CAPILLARY
Glucose-Capillary: 145 mg/dL — ABNORMAL HIGH (ref 70–99)
Glucose-Capillary: 146 mg/dL — ABNORMAL HIGH (ref 70–99)
Glucose-Capillary: 147 mg/dL — ABNORMAL HIGH (ref 70–99)
Glucose-Capillary: 226 mg/dL — ABNORMAL HIGH (ref 70–99)
Glucose-Capillary: 103 mg/dL — ABNORMAL HIGH (ref 70–99)

## 2019-11-01 LAB — CBC
HCT: 40.8 % (ref 39.0–52.0)
Hemoglobin: 13.6 g/dL (ref 13.0–17.0)
MCH: 30.2 pg (ref 26.0–34.0)
MCHC: 33.3 g/dL (ref 30.0–36.0)
MCV: 90.7 fL (ref 80.0–100.0)
Platelets: 212 10*3/uL (ref 150–400)
RBC: 4.5 MIL/uL (ref 4.22–5.81)
RDW: 14.1 % (ref 11.5–15.5)
WBC: 4.9 10*3/uL (ref 4.0–10.5)
nRBC: 0 % (ref 0.0–0.2)

## 2019-11-01 LAB — MAGNESIUM: Magnesium: 1.4 mg/dL — ABNORMAL LOW (ref 1.7–2.4)

## 2019-11-01 LAB — TROPONIN I (HIGH SENSITIVITY): Troponin I (High Sensitivity): 42 ng/L — ABNORMAL HIGH (ref <18)

## 2019-11-01 LAB — HEMOGLOBIN A1C
Hgb A1c MFr Bld: 7.5 % — ABNORMAL HIGH (ref 4.8–5.6)
Mean Plasma Glucose: 168.55 mg/dL

## 2019-11-01 SURGERY — CARDIOVERSION
Anesthesia: General

## 2019-11-01 MED ORDER — MILRINONE LACTATE IN DEXTROSE 20-5 MG/100ML-% IV SOLN
0.1250 ug/kg/min | INTRAVENOUS | Status: DC
  Administered 2019-11-01 – 2019-11-03 (×3): 0.25 ug/kg/min via INTRAVENOUS
  Filled 2019-11-01 (×5): qty 100

## 2019-11-01 MED ORDER — FUROSEMIDE 10 MG/ML IJ SOLN: 80.0000 mg | Freq: Two times a day (BID) | INTRAMUSCULAR | Status: DC

## 2019-11-01 MED ORDER — INSULIN ASPART 100 UNIT/ML ~~LOC~~ SOLN
0.0000 [IU] | Freq: Three times a day (TID) | SUBCUTANEOUS | Status: DC
  Administered 2019-11-01: 5 [IU] via SUBCUTANEOUS
  Administered 2019-11-01 – 2019-11-02 (×2): 2 [IU] via SUBCUTANEOUS
  Administered 2019-11-02: 3 [IU] via SUBCUTANEOUS
  Administered 2019-11-02: 5 [IU] via SUBCUTANEOUS
  Administered 2019-11-03 (×2): 3 [IU] via SUBCUTANEOUS
  Administered 2019-11-04 (×2): 2 [IU] via SUBCUTANEOUS
  Administered 2019-11-04 – 2019-11-05 (×2): 3 [IU] via SUBCUTANEOUS
  Administered 2019-11-05: 2 [IU] via SUBCUTANEOUS

## 2019-11-01 MED ORDER — FUROSEMIDE 10 MG/ML IJ SOLN
10.0000 mg/h | INTRAVENOUS | Status: DC
  Filled 2019-11-01: qty 25

## 2019-11-01 MED ORDER — FUROSEMIDE 10 MG/ML IJ SOLN
10.0000 mg/h | INTRAVENOUS | Status: DC
  Administered 2019-11-01: 10 mg/h via INTRAVENOUS
  Filled 2019-11-01 (×2): qty 25

## 2019-11-01 MED ORDER — POTASSIUM CHLORIDE CRYS ER 20 MEQ PO TBCR
40.0000 meq | EXTENDED_RELEASE_TABLET | Freq: Two times a day (BID) | ORAL | Status: DC
  Administered 2019-11-01 – 2019-11-02 (×2): 40 meq via ORAL
  Filled 2019-11-01 (×3): qty 2

## 2019-11-01 MED ORDER — PROPOFOL 10 MG/ML IV BOLUS
INTRAVENOUS | Status: DC | PRN
  Administered 2019-11-01: 40 mg via INTRAVENOUS

## 2019-11-01 MED ORDER — MAGNESIUM SULFATE 4 GM/100ML IV SOLN
4.0000 g | Freq: Once | INTRAVENOUS | Status: AC
  Administered 2019-11-01 (×2): 4 g via INTRAVENOUS
  Filled 2019-11-01: qty 100

## 2019-11-01 MED ORDER — KETOROLAC TROMETHAMINE 15 MG/ML IJ SOLN
15.0000 mg | Freq: Once | INTRAMUSCULAR | Status: AC
  Administered 2019-11-01: 15 mg via INTRAVENOUS
  Filled 2019-11-01: qty 1

## 2019-11-01 MED ORDER — POTASSIUM CHLORIDE CRYS ER 20 MEQ PO TBCR: 40.0000 meq | EXTENDED_RELEASE_TABLET | Freq: Once | ORAL | Status: DC

## 2019-11-01 NOTE — Progress Notes (Addendum)
   Called by nursing staff to come see  Mr Braccia due to chest pain and no coverage for DM.   Currently denies chest pain. Complaining of shortness of breath.   Remains in A fib with PVCs. On Amio 30 mg per hour. K 3.5 will increase potassium to twice daily.   Mag 1.4. Give 4 grams Mag now.   CO-OX 49%. Start milrinone 0.25 mg.  CVP 13-14. Continue lasix 80 mg twice a day.   Discussed with Dr Aundra Dubin.   Amy Clegg NP-C  9:05 AM

## 2019-11-01 NOTE — H&P (View-Only) (Signed)
Advanced Heart Failure Rounding Note  PCP-Cardiologist: [name], MD   Subjective:    58 y/o AAM w/ h/o substance abuse, nonischemic cardiomyopathy, chronic systolic HF w/ biventricular dysfunction, EF 20-25%, mild-moderate MR, atrial flutter w/ prior cardioversion, cirrhosis, and diabetes, directly admitted from clinic [id number] for a/c systolic CHF, atrial fibrillation and chest pain.   Co-ox low at 49%.  BNP 679. HFTs WNL. Albumin low at 1.9.   Hs trop 35.    Remains in afib w/ CVR. 7 beat run of NSVT on tele overnight.   Poor response to IV diuretics thus far. Only urinated x2 last PM after dose of 80 mg IV Lasix. Received another dose ~5 AM and has not urinated. CVP 13. Was SOB overnight. Currently comfortable on [place].    Objective:   Weight Range: 79.7 kg Body mass index is 24.52 kg/m.   Vital Signs:   Temp:  [97.4 F (36.3 C)-98 F (36.7 C)] 97.6 F (36.4 C) (11/10 0344) Pulse Rate:  [81-103] 81 (11/10 0344) Resp:  [13-20] 13 (11/10 0344) BP: (97-118)/(75-93) 115/90 (11/10 0344) SpO2:  [98 %-99 %] 99 % (11/10 0054) Weight:  [79.6 kg-79.7 kg] 79.7 kg (11/10 0618) Last BM Date: 10/31/19  Weight change: Filed Weights   10/31/19 1557 11/01/19 0145 11/01/19 0618  Weight: 79.7 kg 79.7 kg 79.7 kg    Intake/Output:   Intake/Output Summary (Last 24 hours) at 11/01/2019 0749 Last data filed at 10/31/2019 2040 Gross per 24 hour  Intake 240 ml  Output -  Net 240 ml      Physical Exam    CVP 13 General: thin AAM . No resp difficulty HEENT: Normal Neck: Supple. Elevated JVP to ear . Carotids 2+ bilat; no bruits. No lymphadenopathy or thyromegaly appreciated. Cor: PMI nondisplaced. irregularly irregular rhythm, regular rate. No rubs, gallops or murmurs. Lungs: bilateral inspiratory and expiratory rhonchi  Abdomen: Soft, nontender, nondistended. No hepatosplenomegaly. No bruits or masses. Good bowel sounds. Extremities: No cyanosis, clubbing, rash, trace  bilateral pretibial edema Neuro: Alert & orientedx3, cranial nerves grossly intact. moves all 4 extremities w/o difficulty. Affect pleasant   Telemetry   Atrial fibrillation 70s, 7 beat run of NSVT  EKG    N/A  Labs    CBC Recent Labs    10/31/19 1517 11/01/19 0500  WBC 3.4* 4.9  HGB 15.4 13.6  HCT 46.8 40.8  MCV 93.0 90.7  PLT 231 99991111   Basic Metabolic Panel Recent Labs    10/31/19 1517 11/01/19 0500  NA 136 134*  K 3.4* 3.5  CL 101 97*  CO2 22 27  GLUCOSE 118* 150*  BUN 14 23*  CREATININE 1.29* 1.81*  CALCIUM 7.7* 7.9*  MG  --  1.4*   Liver Function Tests Recent Labs    10/31/19 1517  AST 33  ALT 24  ALKPHOS 61  BILITOT 0.9  PROT 5.5*  ALBUMIN 1.9*   No results for input(s): LIPASE, AMYLASE in the last 72 hours. Cardiac Enzymes No results for input(s): CKTOTAL, CKMB, CKMBINDEX, TROPONINI in the last 72 hours.  BNP: BNP (last 3 results) Recent Labs    09/30/19 0938 10/24/19 1511 10/31/19 1517  BNP 595.5* 384.8* 679.1*    ProBNP (last 3 results) No results for input(s): PROBNP in the last 8760 hours.   D-Dimer No results for input(s): DDIMER in the last 72 hours. Hemoglobin A1C No results for input(s): HGBA1C in the last 72 hours. Fasting Lipid Panel No results for input(s):  CHOL, HDL, LDLCALC, TRIG, CHOLHDL, LDLDIRECT in the last 72 hours. Thyroid Function Tests No results for input(s): TSH, T4TOTAL, T3FREE, THYROIDAB in the last 72 hours.  Invalid input(s): FREET3  Other results:   Imaging    Dg Chest Port 1 View  Result Date: 10/31/2019 CLINICAL DATA:  PICC line placement EXAM: PORTABLE CHEST 1 VIEW COMPARISON:  10/24/2019 FINDINGS: Right-sided PICC line tip projects over the lower SVC. No focal airspace consolidation or pulmonary edema. Unchanged cardiomegaly. No pleural effusion or pneumothorax. IMPRESSION: 1. PICC line tip projecting over the lower SVC. 2. Unchanged cardiomegaly. No overt pulmonary edema. Electronically  Signed   By: Ulyses Jarred M.D.   On: 10/31/2019 19:10   [place] Ekg Site Rite  Result Date: 10/31/2019 If Site Rite image not attached, placement could not be confirmed due to current cardiac rhythm.     Medications:     Scheduled Medications: . apixaban  5 mg Oral BID  . carvedilol  3.125 mg Oral BID WC  . Chlorhexidine Gluconate Cloth  6 each Topical Daily  . DULoxetine  60 mg Oral Daily  . furosemide  80 mg Intravenous Q12H  . gabapentin  300 mg Oral BID  . isosorbide-hydrALAZINE  0.5 tablet Oral TID  . potassium chloride  40 mEq Oral Daily  . sodium chloride flush  3 mL Intravenous Q12H     Infusions: . sodium chloride    . sodium chloride    . amiodarone 30 mg/hr (11/01/19 0630)     PRN Medications:  sodium chloride, acetaminophen, sodium chloride flush, sodium chloride flush    Patient Profile   58 y/o AAM w/ h/o substance abuse, nonischemic cardiomyopathy, chronic systolic HF w/ biventricular dysfunction, EF 20-25%, mild-moderate MR, atrial flutter w/ prior cardioversion, cirrhosis, and diabetes, directly admitted from clinic [id number] for a/c systolic CHF, atrial fibrillation and chest pain.   Assessment/Plan   1.Acute on chronic systolic CHF: Nonischemic cardiomyopathy. Cath in 2017 with no significant coronary disease. Last echo in 3/20 with EF 20-25%, diffuse hypokinesis, mild-moderate MR. Suspect that substance abuse, heavy ETOH and cocaine have caused his cardiomyopathy. Dirrectly admitted from clinic 11/9 for a/c CHF.  - started on IV Lasix 80 mg bid w/ poor urinary response. Co-ox low at 49%. Bump in SCr from 1.2>>1.8.  - Will have RN perform bladder scan -Suspect he will need inotropic support w/ milrinone to help w/ CO and augment diuresis.  -May consider trial of metolazone  -If he continues to respond poorly to diuretics, will need RHC -Continue to monitor Co-ox and CVPs -H/oangioedema with ACEI sonot candidate for ACEI or Entresto, may be able to  start ARB in future, pending renal function.  -Continue Bidil 1/2 tab tid.  - W/ rise in SCr from 1.29>>1.81, will hold off of adding spironolactone today - Narrow QRS, not CRT candidate. Will need to be substance-free for ICD.  - He needs to be converted to NSR, see below. 2.Atrial fibrillation: Has had flutter in the past, now with persistent atrial fibrillation and mild RVR. He has severely dilated atria. Had TEE-guided DCCV in 11/19.  - Continue apixaban 5 mg bid, has been 4 wks without missing a dose.  - on amiodarone gtt and low dose Coreg as above, - Plan DCCV today  3. Cocaine abuse: Says he has quit. - UDS pending. 4. ETOH abuse: Still drinks but cutting back. Encouraged him to stop altogether as this likely contributes to cardiomyopathy.  5. Smoking: Trying to stop. Encouraged him  to quit.  6. Cirrhosis: Due to HCV and ETOH.Long-term amiodarone is not ideal. 7. CKD: Stage 3.bump in SCr from 1.2>>1.8. Continue to monitor w/ diuresis   8. Chest pain: Hs-TnI 35 today, no ECG changes. Prior ER visit 11/2 with slight Hs-TnI increase and no trend. Suspect slight Hs-TnI elevation is due to CHF exacerbation (demand ischemia). Prior cath without significant disease. Suspect chest pain may be related to volume overload.  -  Repeat Hs-trop pending    Length of Stay: 1  Brittainy Simmons, PA-C  11/01/2019, 7:49 AM  Advanced Heart Failure Team Pager [phone] (M-F; 7a - 4p)  Please contact [facility] for night-coverage after hours (4p -7a ) and weekends on [email or web address]  Patient seen with PA, agree with the above note.   Patient still short of breath with orthopnea.  CVP 13-14 this morning, minimal diuresis with IV Lasix boluses and co-ox 49%. Complains of constant CP x 2+ weeks and chest wall is tender centrally.  HS-TnI minimal elevation consistent with past.   Creatinine up to 1.8.  He remains in atrial fibrillation with rate controlled on amiodarone gtt.    General: NAD Neck: JVP 14+ cm, no thyromegaly or thyroid nodule.  Lungs: Prolonged expiratory phase.  CV: Lateral PMI.  Heart irregular S1/S2, no S3/S4, no murmur.  1+ ankle edema.   Abdomen: Soft, nontender, no hepatosplenomegaly, no distention.  Skin: Intact without lesions or rashes.  Neurologic: Alert and oriented x 3.  Psych: Normal affect. Extremities: No clubbing or cyanosis.  HEENT: Normal.   I am going to start him on milrinone 0.25 with evidence for low output (low co-ox, rise in creatinine), transition from Lasix boluses to Lasix gtt 10 mg/hr.  When creatinine stabilizes, start digoxin.  Stable BP, continue low dose Bidil and Coreg for now.    Candidacy for advanced therapies would be questionable given history of cocaine abuse, most recently documented in 6/20.  UDS ordered.   Continue amiodarone gtt, atrial fibrillation is rate-controlled.  He has not missed Eliquis doses, plan for DCCV later today.   He has had constant mild chest pain, chest wall is tender.  Hs-TnI has been consistently mildly elevated, suspect demand ischemia from volume overload and doubt ACS.  Repeating Hs-TnI.   Loralie Champagne 11/01/2019 9:23 AM

## 2019-11-01 NOTE — Anesthesia Preprocedure Evaluation (Addendum)
Anesthesia Evaluation  Patient identified by MRN, date of birth, ID band Patient awake    Reviewed: Allergy & Precautions, H&P , NPO status , Patient's Chart, lab work & pertinent test results  History of Anesthesia Complications Negative for: history of anesthetic complications  Airway Mallampati: III  TM Distance: >3 FB Neck ROM: full    Dental  (+) Poor Dentition, Missing, Chipped,    Pulmonary shortness of breath, Current Smoker and Patient abstained from smoking.,    breath sounds clear to auscultation       Cardiovascular hypertension, + CAD, + Past MI and +CHF  + dysrhythmias Atrial Fibrillation  Rhythm:irregular Rate:Normal     Neuro/Psych PSYCHIATRIC DISORDERS Depression CVA    GI/Hepatic GERD  ,(+) Cirrhosis     substance abuse  alcohol use, Hepatitis -, C  Endo/Other  diabetes  Renal/GU Renal InsufficiencyRenal disease     Musculoskeletal  (+) Arthritis ,   Abdominal   Peds  Hematology   Anesthesia Other Findings   Reproductive/Obstetrics                            Anesthesia Physical Anesthesia Plan  ASA: III  Anesthesia Plan: General   Post-op Pain Management:    Induction: Intravenous  PONV Risk Score and Plan: 1 and Propofol infusion and Treatment may vary due to age or medical condition  Airway Management Planned: Mask  Additional Equipment:   Intra-op Plan:   Post-operative Plan:   Informed Consent: I have reviewed the patients History and Physical, chart, labs and discussed the procedure including the risks, benefits and alternatives for the proposed anesthesia with the patient or authorized representative who has indicated his/her understanding and acceptance.       Plan Discussed with: CRNA, Anesthesiologist and Surgeon  Anesthesia Plan Comments:         Anesthesia Quick Evaluation

## 2019-11-01 NOTE — Progress Notes (Addendum)
Advanced Heart Failure Rounding Note  PCP-Cardiologist: [name], MD   Subjective:    58 y/o AAM w/ h/o substance abuse, nonischemic cardiomyopathy, chronic systolic HF w/ biventricular dysfunction, EF 20-25%, mild-moderate MR, atrial flutter w/ prior cardioversion, cirrhosis, and diabetes, directly admitted from clinic [id number] for a/c systolic CHF, atrial fibrillation and chest pain.   Co-ox low at 49%.  BNP 679. HFTs WNL. Albumin low at 1.9.   Hs trop 35.    Remains in afib w/ CVR. 7 beat run of NSVT on tele overnight.   Poor response to IV diuretics thus far. Only urinated x2 last PM after dose of 80 mg IV Lasix. Received another dose ~5 AM and has not urinated. CVP 13. Was SOB overnight. Currently comfortable on [place].    Objective:   Weight Range: 79.7 kg Body mass index is 24.52 kg/m.   Vital Signs:   Temp:  [97.4 F (36.3 C)-98 F (36.7 C)] 97.6 F (36.4 C) (11/10 0344) Pulse Rate:  [81-103] 81 (11/10 0344) Resp:  [13-20] 13 (11/10 0344) BP: (97-118)/(75-93) 115/90 (11/10 0344) SpO2:  [98 %-99 %] 99 % (11/10 0054) Weight:  [79.6 kg-79.7 kg] 79.7 kg (11/10 0618) Last BM Date: 10/31/19  Weight change: Filed Weights   10/31/19 1557 11/01/19 0145 11/01/19 0618  Weight: 79.7 kg 79.7 kg 79.7 kg    Intake/Output:   Intake/Output Summary (Last 24 hours) at 11/01/2019 0749 Last data filed at 10/31/2019 2040 Gross per 24 hour  Intake 240 ml  Output -  Net 240 ml      Physical Exam    CVP 13 General: thin AAM . No resp difficulty HEENT: Normal Neck: Supple. Elevated JVP to ear . Carotids 2+ bilat; no bruits. No lymphadenopathy or thyromegaly appreciated. Cor: PMI nondisplaced. irregularly irregular rhythm, regular rate. No rubs, gallops or murmurs. Lungs: bilateral inspiratory and expiratory rhonchi  Abdomen: Soft, nontender, nondistended. No hepatosplenomegaly. No bruits or masses. Good bowel sounds. Extremities: No cyanosis, clubbing, rash, trace  bilateral pretibial edema Neuro: Alert & orientedx3, cranial nerves grossly intact. moves all 4 extremities w/o difficulty. Affect pleasant   Telemetry   Atrial fibrillation 70s, 7 beat run of NSVT  EKG    N/A  Labs    CBC Recent Labs    10/31/19 1517 11/01/19 0500  WBC 3.4* 4.9  HGB 15.4 13.6  HCT 46.8 40.8  MCV 93.0 90.7  PLT 231 99991111   Basic Metabolic Panel Recent Labs    10/31/19 1517 11/01/19 0500  NA 136 134*  K 3.4* 3.5  CL 101 97*  CO2 22 27  GLUCOSE 118* 150*  BUN 14 23*  CREATININE 1.29* 1.81*  CALCIUM 7.7* 7.9*  MG  --  1.4*   Liver Function Tests Recent Labs    10/31/19 1517  AST 33  ALT 24  ALKPHOS 61  BILITOT 0.9  PROT 5.5*  ALBUMIN 1.9*   No results for input(s): LIPASE, AMYLASE in the last 72 hours. Cardiac Enzymes No results for input(s): CKTOTAL, CKMB, CKMBINDEX, TROPONINI in the last 72 hours.  BNP: BNP (last 3 results) Recent Labs    09/30/19 0938 10/24/19 1511 10/31/19 1517  BNP 595.5* 384.8* 679.1*    ProBNP (last 3 results) No results for input(s): PROBNP in the last 8760 hours.   D-Dimer No results for input(s): DDIMER in the last 72 hours. Hemoglobin A1C No results for input(s): HGBA1C in the last 72 hours. Fasting Lipid Panel No results for input(s):  CHOL, HDL, LDLCALC, TRIG, CHOLHDL, LDLDIRECT in the last 72 hours. Thyroid Function Tests No results for input(s): TSH, T4TOTAL, T3FREE, THYROIDAB in the last 72 hours.  Invalid input(s): FREET3  Other results:   Imaging    Dg Chest Port 1 View  Result Date: 10/31/2019 CLINICAL DATA:  PICC line placement EXAM: PORTABLE CHEST 1 VIEW COMPARISON:  10/24/2019 FINDINGS: Right-sided PICC line tip projects over the lower SVC. No focal airspace consolidation or pulmonary edema. Unchanged cardiomegaly. No pleural effusion or pneumothorax. IMPRESSION: 1. PICC line tip projecting over the lower SVC. 2. Unchanged cardiomegaly. No overt pulmonary edema. Electronically  Signed   By: Ulyses Jarred M.D.   On: 10/31/2019 19:10   [place] Ekg Site Rite  Result Date: 10/31/2019 If Site Rite image not attached, placement could not be confirmed due to current cardiac rhythm.     Medications:     Scheduled Medications: . apixaban  5 mg Oral BID  . carvedilol  3.125 mg Oral BID WC  . Chlorhexidine Gluconate Cloth  6 each Topical Daily  . DULoxetine  60 mg Oral Daily  . furosemide  80 mg Intravenous Q12H  . gabapentin  300 mg Oral BID  . isosorbide-hydrALAZINE  0.5 tablet Oral TID  . potassium chloride  40 mEq Oral Daily  . sodium chloride flush  3 mL Intravenous Q12H     Infusions: . sodium chloride    . sodium chloride    . amiodarone 30 mg/hr (11/01/19 0630)     PRN Medications:  sodium chloride, acetaminophen, sodium chloride flush, sodium chloride flush    Patient Profile   58 y/o AAM w/ h/o substance abuse, nonischemic cardiomyopathy, chronic systolic HF w/ biventricular dysfunction, EF 20-25%, mild-moderate MR, atrial flutter w/ prior cardioversion, cirrhosis, and diabetes, directly admitted from clinic [id number] for a/c systolic CHF, atrial fibrillation and chest pain.   Assessment/Plan   1.Acute on chronic systolic CHF: Nonischemic cardiomyopathy. Cath in 2017 with no significant coronary disease. Last echo in 3/20 with EF 20-25%, diffuse hypokinesis, mild-moderate MR. Suspect that substance abuse, heavy ETOH and cocaine have caused his cardiomyopathy. Dirrectly admitted from clinic 11/9 for a/c CHF.  - started on IV Lasix 80 mg bid w/ poor urinary response. Co-ox low at 49%. Bump in SCr from 1.2>>1.8.  - Will have RN perform bladder scan -Suspect he will need inotropic support w/ milrinone to help w/ CO and augment diuresis.  -May consider trial of metolazone  -If he continues to respond poorly to diuretics, will need RHC -Continue to monitor Co-ox and CVPs -H/oangioedema with ACEI sonot candidate for ACEI or Entresto, may be able to  start ARB in future, pending renal function.  -Continue Bidil 1/2 tab tid.  - W/ rise in SCr from 1.29>>1.81, will hold off of adding spironolactone today - Narrow QRS, not CRT candidate. Will need to be substance-free for ICD.  - He needs to be converted to NSR, see below. 2.Atrial fibrillation: Has had flutter in the past, now with persistent atrial fibrillation and mild RVR. He has severely dilated atria. Had TEE-guided DCCV in 11/19.  - Continue apixaban 5 mg bid, has been 4 wks without missing a dose.  - on amiodarone gtt and low dose Coreg as above, - Plan DCCV today  3. Cocaine abuse: Says he has quit. - UDS pending. 4. ETOH abuse: Still drinks but cutting back. Encouraged him to stop altogether as this likely contributes to cardiomyopathy.  5. Smoking: Trying to stop. Encouraged him  to quit.  6. Cirrhosis: Due to HCV and ETOH.Long-term amiodarone is not ideal. 7. CKD: Stage 3.bump in SCr from 1.2>>1.8. Continue to monitor w/ diuresis   8. Chest pain: Hs-TnI 35 today, no ECG changes. Prior ER visit 11/2 with slight Hs-TnI increase and no trend. Suspect slight Hs-TnI elevation is due to CHF exacerbation (demand ischemia). Prior cath without significant disease. Suspect chest pain may be related to volume overload.  -  Repeat Hs-trop pending    Length of Stay: 1  Brittainy Simmons, PA-C  11/01/2019, 7:49 AM  Advanced Heart Failure Team Pager [phone] (M-F; 7a - 4p)  Please contact [facility] for night-coverage after hours (4p -7a ) and weekends on [email or web address]  Patient seen with PA, agree with the above note.   Patient still short of breath with orthopnea.  CVP 13-14 this morning, minimal diuresis with IV Lasix boluses and co-ox 49%. Complains of constant CP x 2+ weeks and chest wall is tender centrally.  HS-TnI minimal elevation consistent with past.   Creatinine up to 1.8.  He remains in atrial fibrillation with rate controlled on amiodarone gtt.    General: NAD Neck: JVP 14+ cm, no thyromegaly or thyroid nodule.  Lungs: Prolonged expiratory phase.  CV: Lateral PMI.  Heart irregular S1/S2, no S3/S4, no murmur.  1+ ankle edema.   Abdomen: Soft, nontender, no hepatosplenomegaly, no distention.  Skin: Intact without lesions or rashes.  Neurologic: Alert and oriented x 3.  Psych: Normal affect. Extremities: No clubbing or cyanosis.  HEENT: Normal.   I am going to start him on milrinone 0.25 with evidence for low output (low co-ox, rise in creatinine), transition from Lasix boluses to Lasix gtt 10 mg/hr.  When creatinine stabilizes, start digoxin.  Stable BP, continue low dose Bidil and Coreg for now.    Candidacy for advanced therapies would be questionable given history of cocaine abuse, most recently documented in 6/20.  UDS ordered.   Continue amiodarone gtt, atrial fibrillation is rate-controlled.  He has not missed Eliquis doses, plan for DCCV later today.   He has had constant mild chest pain, chest wall is tender.  Hs-TnI has been consistently mildly elevated, suspect demand ischemia from volume overload and doubt ACS.  Repeating Hs-TnI.   Loralie Champagne 11/01/2019 9:23 AM

## 2019-11-01 NOTE — Interval H&P Note (Signed)
History and Physical Interval Note:  11/01/2019 11:00 AM  [name]  has presented today for surgery, with the diagnosis of afib.  The various methods of treatment have been discussed with the patient and family. After consideration of risks, benefits and other options for treatment, the patient has consented to  Procedure(s): CARDIOVERSION (N/A) as a surgical intervention.  The patient's history has been reviewed, patient examined, no change in status, stable for surgery.  I have reviewed the patient's chart and labs.  Questions were answered to the patient's satisfaction.     Dagmawi Venable [employer]

## 2019-11-01 NOTE — Transfer of Care (Signed)
Immediate Anesthesia Transfer of Care Note  Patient: Brad [name]  Procedure(s) Performed: CARDIOVERSION (N/A )  Patient Location: Endoscopy Unit  Anesthesia Type:MAC  Level of Consciousness: drowsy  Airway & Oxygen Therapy: Patient Spontanous Breathing and Patient connected to nasal cannula oxygen  Post-op Assessment: Report given to RN and Post -op Vital signs reviewed and stable  Post vital signs: Reviewed and stable  Last Vitals:  Vitals Value Taken Time  BP    Temp    Pulse 69 11/01/19 1115  Resp 24 11/01/19 1115  SpO2 99 % 11/01/19 1115    Last Pain:  Vitals:   11/01/19 1006  TempSrc: Tympanic  PainSc: 8       Patients Stated Pain Goal: 3 ([id number])  Complications: No apparent anesthesia complications

## 2019-11-01 NOTE — Progress Notes (Signed)
Pt continues to complain of pain.  Paged provider.  Page returned by Dr. Neena Rhymes.  Verbal order for 15 mg Toradol with readback. Will administer pain medication.

## 2019-11-01 NOTE — Procedures (Signed)
Electrical Cardioversion Procedure Note Clevester Madrigal [id number] 09/05/1961  Procedure: Electrical Cardioversion Indications:  Atrial Fibrillation  Procedure Details Consent: Risks of procedure as well as the alternatives and risks of each were explained to the (patient/caregiver).  Consent for procedure obtained. Time Out: Verified patient identification, verified procedure, site/side was marked, verified correct patient position, special equipment/implants available, medications/allergies/relevent history reviewed, required imaging and test results available.  Performed  Patient placed on cardiac monitor, pulse oximetry, supplemental oxygen as necessary.  Sedation given: Propofol per anesthesiology Pacer pads placed anterior and posterior chest.  Cardioverted 1 time(s).  Cardioverted at [place].  Evaluation Findings: Post procedure EKG shows: NSR Complications: None Patient did tolerate procedure well.   Loralie Champagne 11/01/2019, 11:13 AM

## 2019-11-01 NOTE — Plan of Care (Signed)
  Problem: Activity: Goal: Risk for activity intolerance will decrease Outcome: Progressing   Problem: Safety: Goal: Ability to remain free from injury will improve Outcome: Progressing   

## 2019-11-01 NOTE — Anesthesia Procedure Notes (Signed)
Procedure Name: General with mask airway Date/Time: 11/01/2019 11:10 AM Performed by: Colin Benton, CRNA Pre-anesthesia Checklist: Patient identified, Emergency Drugs available, Suction available and Patient being monitored Patient Re-evaluated:Patient Re-evaluated prior to induction Oxygen Delivery Method: Ambu bag Preoxygenation: Pre-oxygenation with 100% oxygen Induction Type: IV induction Ventilation: Mask ventilation without difficulty Placement Confirmation: positive ETCO2 Dental Injury: Teeth and Oropharynx as per pre-operative assessment

## 2019-11-01 NOTE — Anesthesia Postprocedure Evaluation (Signed)
Anesthesia Post Note  Patient: Brad [name]  Procedure(s) Performed: CARDIOVERSION (N/A )     Patient location during evaluation: Endoscopy Anesthesia Type: General Level of consciousness: awake and patient cooperative Pain management: pain level controlled Vital Signs Assessment: post-procedure vital signs reviewed and stable Respiratory status: spontaneous breathing, nonlabored ventilation, respiratory function stable and patient connected to nasal cannula oxygen Cardiovascular status: stable Postop Assessment: no apparent nausea or vomiting Anesthetic complications: no    Last Vitals:  Vitals:   11/01/19 1115 11/01/19 1116  BP:  108/77  Pulse: 69   Resp: (!) 24 (!) 24  Temp:  37.4 C  SpO2: 99% 100%    Last Pain:  Vitals:   11/01/19 1116  TempSrc: Temporal  PainSc: 9                  Marion Rosenberry

## 2019-11-02 LAB — GLUCOSE, CAPILLARY
Glucose-Capillary: 211 mg/dL — ABNORMAL HIGH (ref 70–99)
Glucose-Capillary: 184 mg/dL — ABNORMAL HIGH (ref 70–99)
Glucose-Capillary: 142 mg/dL — ABNORMAL HIGH (ref 70–99)
Glucose-Capillary: 170 mg/dL — ABNORMAL HIGH (ref 70–99)

## 2019-11-02 LAB — COOXEMETRY PANEL
Carboxyhemoglobin: 1.9 % — ABNORMAL HIGH (ref 0.5–1.5)
Methemoglobin: 0.9 % (ref 0.0–1.5)
O2 Saturation: 72.5 %
Total hemoglobin: 12.7 g/dL (ref 12.0–16.0)

## 2019-11-02 LAB — RAPID URINE DRUG SCREEN, HOSP PERFORMED
Amphetamines: NOT DETECTED
Barbiturates: NOT DETECTED
Benzodiazepines: NOT DETECTED
Cocaine: POSITIVE — AB
Opiates: NOT DETECTED
Tetrahydrocannabinol: NOT DETECTED

## 2019-11-02 LAB — BASIC METABOLIC PANEL
Anion gap: 10 (ref 5–15)
BUN: 32 mg/dL — ABNORMAL HIGH (ref 6–20)
CO2: 24 mmol/L (ref 22–32)
Calcium: 7.8 mg/dL — ABNORMAL LOW (ref 8.9–10.3)
Chloride: 101 mmol/L (ref 98–111)
Creatinine, Ser: 1.96 mg/dL — ABNORMAL HIGH (ref 0.61–1.24)
GFR calc Af Amer: 42 mL/min — ABNORMAL LOW (ref >60)
GFR calc non Af Amer: 36 mL/min — ABNORMAL LOW (ref >60)
Glucose, Bld: 147 mg/dL — ABNORMAL HIGH (ref 70–99)
Potassium: 3.9 mmol/L (ref 3.5–5.1)
Sodium: 135 mmol/L (ref 135–145)

## 2019-11-02 LAB — MAGNESIUM: Magnesium: 2 mg/dL (ref 1.7–2.4)

## 2019-11-02 MED ORDER — GABAPENTIN 400 MG PO CAPS
400.0000 mg | ORAL_CAPSULE | Freq: Two times a day (BID) | ORAL | Status: DC
  Administered 2019-11-02 – 2019-11-05 (×6): 400 mg via ORAL
  Filled 2019-11-02 (×6): qty 1

## 2019-11-02 MED ORDER — SPIRONOLACTONE 12.5 MG HALF TABLET
12.5000 mg | ORAL_TABLET | Freq: Every day | ORAL | Status: DC
  Administered 2019-11-02 – 2019-11-03 (×2): 12.5 mg via ORAL
  Filled 2019-11-02 (×2): qty 1

## 2019-11-02 NOTE — Progress Notes (Addendum)
Advanced Heart Failure Rounding Note  PCP-Cardiologist: [name], MD   Subjective:    58 y/o AAM w/ h/o substance abuse, nonischemic cardiomyopathy, chronic systolic HF w/ biventricular dysfunction, EF 20-25%, mild-moderate MR, atrial flutter w/ prior cardioversion, cirrhosis, and diabetes, directly admitted from clinic [id number] for a/c systolic CHF, atrial fibrillation and chest pain.   Yesterday started on milrinone 0.25 mcg+ amio drip + lasix drip 10 mg per hour. Co-ox stable 72.5%.   Creatinine trending up 1.3>1.8>1.9   Feeling much better today. Denies SOB.    Objective:   Weight Range: 81.7 kg Body mass index is 25.13 kg/m.   Vital Signs:   Temp:  [97.5 F (36.4 C)-99.1 F (37.3 C)] 97.6 F (36.4 C) (11/11 0517) Pulse Rate:  [73-83] 83 (11/11 0800) Resp:  [0-45] 16 (11/11 0517) BP: (95-115)/(70-82) 115/70 (11/11 0800) SpO2:  [94 %-100 %] 94 % (11/11 0517) Weight:  [81.7 kg] 81.7 kg (11/11 0517) Last BM Date: 10/31/19  Weight change: Filed Weights   11/01/19 0145 11/01/19 0618 11/02/19 0517  Weight: 79.7 kg 79.7 kg 81.7 kg    Intake/Output:   Intake/Output Summary (Last 24 hours) at 11/02/2019 1201 Last data filed at 11/02/2019 0900 Gross per 24 hour  Intake 1583.23 ml  Output 1100 ml  Net 483.23 ml      Physical Exam   CVP 9-10  General:  In bed. No resp difficulty HEENT: normal Neck: supple. JVP 9-10  Carotids 2+ bilat; no bruits. No lymphadenopathy or thryomegaly appreciated. Cor: PMI nondisplaced. Regular rate & rhythm. No rubs, gallops or murmurs. Lungs: clear Abdomen: soft, nontender, nondistended. No hepatosplenomegaly. No bruits or masses. Good bowel sounds. Extremities: no cyanosis, clubbing, rash, edema. RUE PICC  Neuro: alert & orientedx3, cranial nerves grossly intact. moves all 4 extremities w/o difficulty. Affect pleasant    Telemetry   SR 80-90s personally reviewed.   EKG    N/A  Labs    CBC Recent Labs   10/31/19 1517 11/01/19 0500  WBC 3.4* 4.9  HGB 15.4 13.6  HCT 46.8 40.8  MCV 93.0 90.7  PLT 231 [id number]   Basic Metabolic Panel Recent Labs    11/01/19 0500 11/02/19 0347  NA 134* 135  K 3.5 3.9  CL 97* 101  CO2 27 24  GLUCOSE 150* 147*  BUN 23* 32*  CREATININE 1.81* 1.96*  CALCIUM 7.9* 7.8*  MG 1.4* 2.0   Liver Function Tests Recent Labs    10/31/19 1517  AST 33  ALT 24  ALKPHOS 61  BILITOT 0.9  PROT 5.5*  ALBUMIN 1.9*   No results for input(s): LIPASE, AMYLASE in the last 72 hours. Cardiac Enzymes No results for input(s): CKTOTAL, CKMB, CKMBINDEX, TROPONINI in the last 72 hours.  BNP: BNP (last 3 results) Recent Labs    09/30/19 0938 10/24/19 1511 10/31/19 1517  BNP 595.5* 384.8* 679.1*    ProBNP (last 3 results) No results for input(s): PROBNP in the last 8760 hours.   D-Dimer No results for input(s): DDIMER in the last 72 hours. Hemoglobin A1C Recent Labs    11/01/19 0500  HGBA1C 7.5*   Fasting Lipid Panel No results for input(s): CHOL, HDL, LDLCALC, TRIG, CHOLHDL, LDLDIRECT in the last 72 hours. Thyroid Function Tests No results for input(s): TSH, T4TOTAL, T3FREE, THYROIDAB in the last 72 hours.  Invalid input(s): FREET3  Other results:   Imaging    No results found.   Medications:     Scheduled Medications: .  apixaban  5 mg Oral BID  . Chlorhexidine Gluconate Cloth  6 each Topical Daily  . DULoxetine  60 mg Oral Daily  . gabapentin  300 mg Oral BID  . insulin aspart  0-15 Units Subcutaneous TID WC  . isosorbide-hydrALAZINE  0.5 tablet Oral TID  . potassium chloride  40 mEq Oral BID  . sodium chloride flush  3 mL Intravenous Q12H    Infusions: . sodium chloride 10 mL/hr at 11/01/19 1056  . sodium chloride    . amiodarone 30 mg/hr (11/02/19 0551)  . furosemide (LASIX) infusion 10 mg/hr (11/01/19 1253)  . milrinone 0.25 mcg/kg/min (11/02/19 0552)    PRN Medications: sodium chloride, acetaminophen, sodium chloride  flush, sodium chloride flush    Patient Profile   59 y/o AAM w/ h/o substance abuse, nonischemic cardiomyopathy, chronic systolic HF w/ biventricular dysfunction, EF 20-25%, mild-moderate MR, atrial flutter w/ prior cardioversion, cirrhosis, and diabetes, directly admitted from clinic [id number] for a/c systolic CHF, atrial fibrillation and chest pain.   Assessment/Plan   1.Acute on chronic systolic CHF: Nonischemic cardiomyopathy. Cath in 2017 with no significant coronary disease. Last echo in 3/20 with EF 20-25%, diffuse hypokinesis, mild-moderate MR. RV moderately reduced.  Suspect that substance abuse, heavy ETOH and cocaine have caused his cardiomyopathy. Directly admitted from clinic 11/9 for a/c CHF.  - started on IV Lasix 80 mg bid w/ poor urinary response and switched to lasix infusion. Co-ox stable 72.5%  - CVP 9-10. I wonder if this is as good as we are going to get him with RV dysfunction. Stop lasix drip.  Creatinine 1.2>>1.8.>>1.96.  - Tomorrow anticipate starting torsemide 80 mg /60 mg .   - Continue milrinone at current dose. Anticipate wean soon.  -H/oangioedema with ACEI sonot candidate for ACEI or Entresto, may be able to start ARB in future, pending renal function.  -Continue Bidil 1/2 tab tid.  - Narrow QRS, not CRT candidate. Will need to be substance-free for ICD.  - UDS + Cocaine  2.Atrial fibrillation: Has had flutter in the past, now with persistent atrial fibrillation and mild RVR at admission. He has severely dilated atria. Had TEE-guided DCCV in 11/19 - Continue apixaban 5 mg bid. - S/P DC-CV 11/10. Maintaining NSR today.   - on amiodarone gtt and low dose Coreg as above. Continue amio drip until we have weaned off milrinone.  3. Cocaine abuse: UDS + this admit. We discussed he needs to avoid cocaine use.  4. ETOH abuse: Still drinks but cutting back. Encouraged him to stop altogether as this likely contributes to cardiomyopathy.  5. Smoking: Trying to  stop. Encouraged him to quit.  6. Cirrhosis: Due to HCV and ETOH.Long-term amiodarone is not ideal. 7. CKD: Stage 3.bump in SCr from 1.2>>1.8>>1.96 . Continue to monitor w/ diuresis   8. Chest pain: Hs-TnI 35, no ECG changes. Prior ER visit 11/2 with slight Hs-TnI increase and no trend. Suspect slight Hs-TnI elevation is due to CHF exacerbation (demand ischemia). Prior cath without significant disease. Suspect chest pain may be related to volume overload.  - No chest pain today.   Consult cardiac rehab. He already has HF Paramedicine.   Length of Stay: 2  Darrick Grinder, NP  11/02/2019, 12:01 PM  Advanced Heart Failure Team Pager [phone] (M-F; 7a - 4p)  Please contact [facility] for night-coverage after hours (4p -7a ) and weekends on [email or web address]  Patient seen with NP, agree with the above note.   He remains in NSR after  cardioversion yesterday.  Creatinine up to 1.9.  CVP lower at 9-10.  He is feeling much better.  +UDS for cocaine.   General: NAD Neck: JVP 8-9 cm, no thyromegaly or thyroid nodule.  Lungs: Clear to auscultation bilaterally with normal respiratory effort. CV: Lateral PMI.  Heart regular S1/S2, no S3/S4, no murmur.  No peripheral edema.   Abdomen: Soft, nontender, no hepatosplenomegaly, no distention.  Skin: Intact without lesions or rashes.  Neurologic: Alert and oriented x 3.  Psych: Normal affect. Extremities: No clubbing or cyanosis.  HEENT: Normal.   He feels much better today, remains in NSR on amiodarone.  - Will start titrating down milrinone tomorrow, would continue amiodarone as gtt until then.  - Continue apixaban.   Creatinine up to 1.9, CVP 9-10.  Agree with stopping Lasix gtt today, continuing milrinone 0.25.  - Wean milrinone to off starting tomorrow morning.  - Continue Bidil 1/2 tab tid.  - Start po torsemide tomorrow.  - Stop K supplement and start spironolactone 12.5 daily.  - If creatinine stabilizes tomorrow, start digoxin.    Cocaine+, he says he thought he was smoking marijuana but suspects it was laced with cocaine.   Loralie Champagne 11/02/2019 1:30 PM

## 2019-11-03 ENCOUNTER — Telehealth: Payer: Self-pay

## 2019-11-03 LAB — GLUCOSE, CAPILLARY
Glucose-Capillary: 165 mg/dL — ABNORMAL HIGH (ref 70–99)
Glucose-Capillary: 110 mg/dL — ABNORMAL HIGH (ref 70–99)
Glucose-Capillary: 183 mg/dL — ABNORMAL HIGH (ref 70–99)
Glucose-Capillary: 119 mg/dL — ABNORMAL HIGH (ref 70–99)

## 2019-11-03 LAB — BASIC METABOLIC PANEL
Anion gap: 9 (ref 5–15)
BUN: 31 mg/dL — ABNORMAL HIGH (ref 6–20)
CO2: 24 mmol/L (ref 22–32)
Calcium: 7.8 mg/dL — ABNORMAL LOW (ref 8.9–10.3)
Chloride: 101 mmol/L (ref 98–111)
Creatinine, Ser: 1.97 mg/dL — ABNORMAL HIGH (ref 0.61–1.24)
GFR calc Af Amer: 42 mL/min — ABNORMAL LOW (ref >60)
GFR calc non Af Amer: 36 mL/min — ABNORMAL LOW (ref >60)
Glucose, Bld: 186 mg/dL — ABNORMAL HIGH (ref 70–99)
Potassium: 3.9 mmol/L (ref 3.5–5.1)
Sodium: 134 mmol/L — ABNORMAL LOW (ref 135–145)

## 2019-11-03 LAB — COOXEMETRY PANEL
Carboxyhemoglobin: 1.2 % (ref 0.5–1.5)
Methemoglobin: 0.7 % (ref 0.0–1.5)
O2 Saturation: 61.6 %
Total hemoglobin: 13.2 g/dL (ref 12.0–16.0)

## 2019-11-03 IMAGING — DX DG CHEST 1V PORT
1 series · 1 of 1 positions shown · non-contrast
Comparison: 08/26/2018

CLINICAL DATA: Pt arrives to ED from home with complaints of chest
pain and SOB since last night. EMS reports pt has CHF, has been out
of his medicine for a "long time". Hx of A-fib, CHf, diabetes, hep
C, HTN.

EXAM:
PORTABLE CHEST 1 VIEW

[chest]
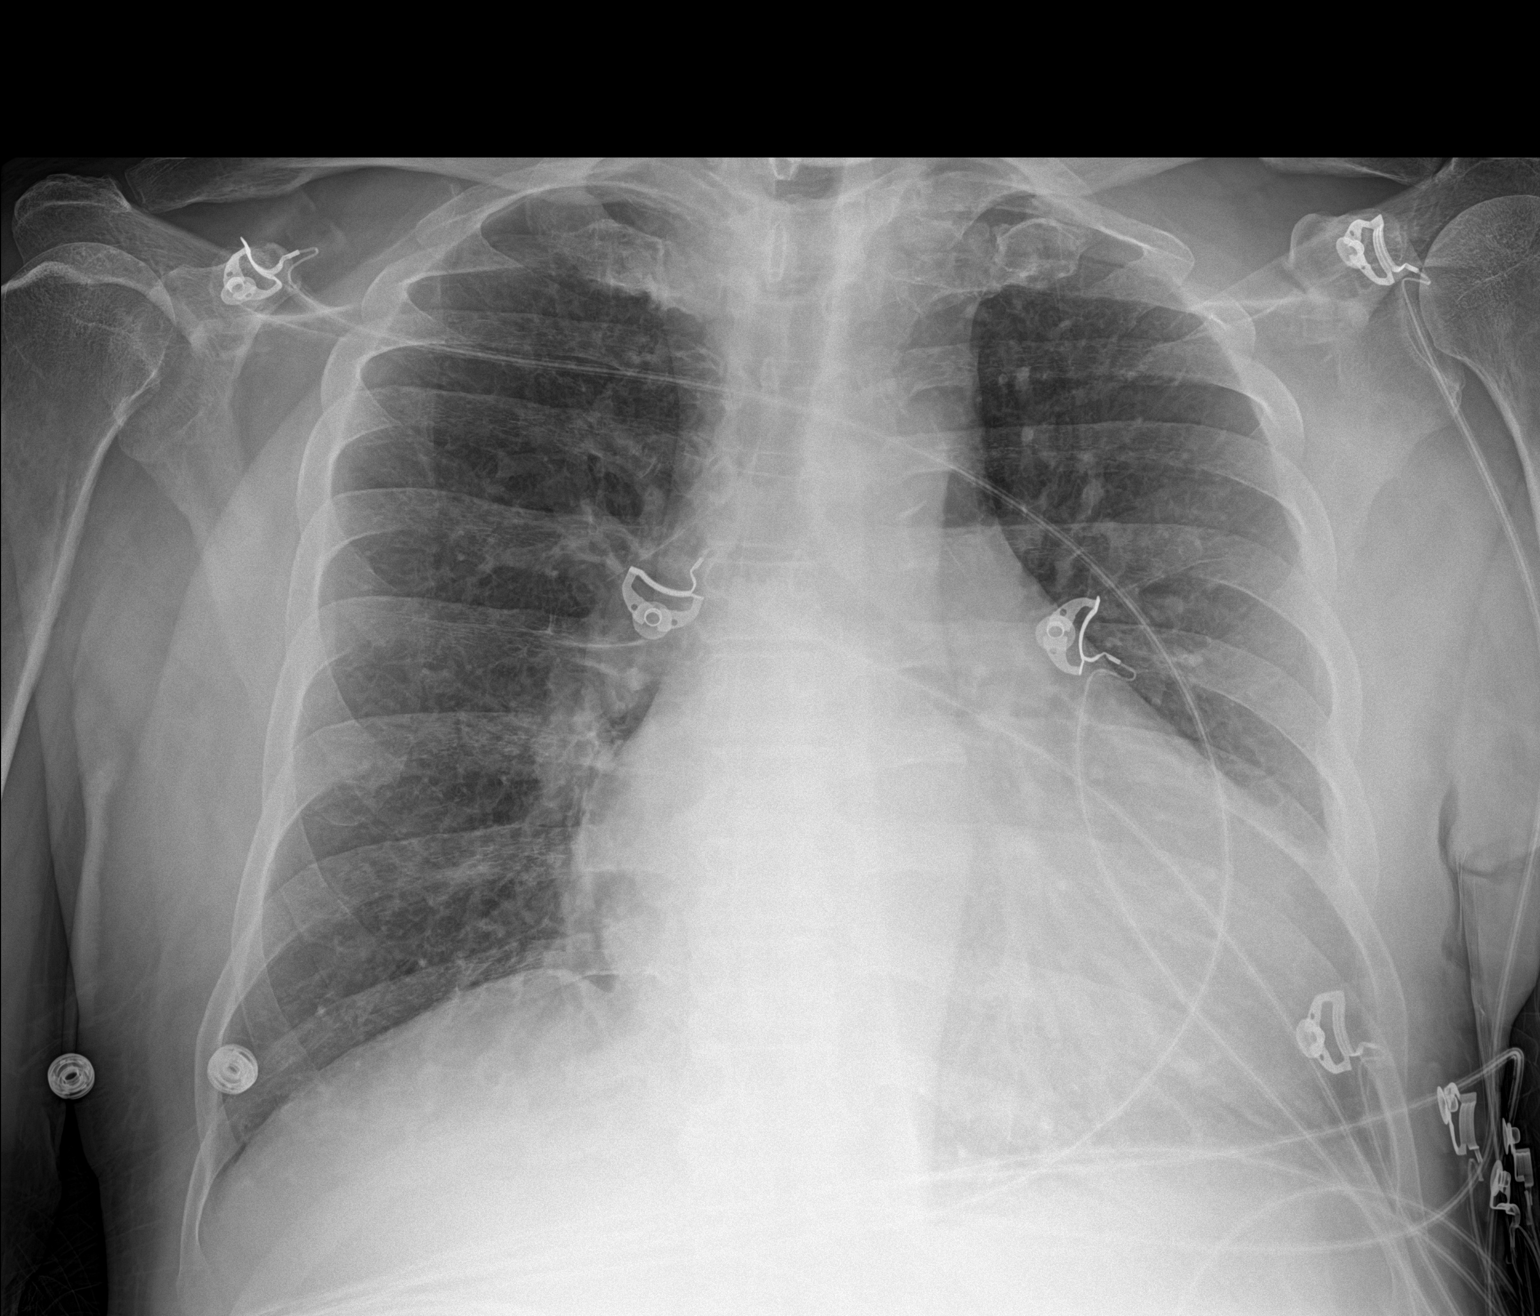

[1 of 1 positions shown; findings below may reference images not displayed]

FINDINGS: Mild enlargement of the cardiopericardial silhouette, stable. No
mediastinal or hilar masses. There is no evidence of adenopathy.

Lungs are clear.  No pleural effusion or pneumothorax.

Skeletal structures are grossly intact.
IMPRESSION: No acute cardiopulmonary disease.

## 2019-11-03 MED ORDER — METOLAZONE 2.5 MG PO TABS
2.5000 mg | ORAL_TABLET | Freq: Once | ORAL | Status: AC
  Administered 2019-11-03: 2.5 mg via ORAL
  Filled 2019-11-03: qty 1

## 2019-11-03 MED ORDER — DIGOXIN 125 MCG PO TABS
0.1250 mg | ORAL_TABLET | Freq: Every day | ORAL | Status: DC
  Administered 2019-11-03 – 2019-11-05 (×3): 0.125 mg via ORAL
  Filled 2019-11-03 (×3): qty 1

## 2019-11-03 MED ORDER — POTASSIUM CHLORIDE CRYS ER 20 MEQ PO TBCR
40.0000 meq | EXTENDED_RELEASE_TABLET | Freq: Once | ORAL | Status: AC
  Administered 2019-11-03: 40 meq via ORAL
  Filled 2019-11-03: qty 2

## 2019-11-03 MED ORDER — FUROSEMIDE 10 MG/ML IJ SOLN
12.0000 mg/h | INTRAVENOUS | Status: DC
  Administered 2019-11-03: 12 mg/h via INTRAVENOUS
  Filled 2019-11-03 (×2): qty 25

## 2019-11-03 NOTE — Telephone Encounter (Signed)
Call received from patient. He said that he left a message for [employer] coordinated re-entry program # [phone].  He explained that he will not be going back to his prior apartment because the person that he was living with did not pay the rent. He said that hs is not sure where he will be staying. Possibly with a friend in [place] or at a motel temporarily.

## 2019-11-03 NOTE — Progress Notes (Signed)
CARDIAC REHAB PHASE I   PRE:  Rate/Rhythm: 79 SR   BP:  Supine: 111/99  Sitting:   Standing:    SaO2: 97 RA  MODE:  Ambulation: 470 ft   POST:  Rate/Rhythm: 92 SR  BP:  Supine: 131/95  Sitting:   Standing:    SaO2: 97 RA 1135-1200  Assisted X 1 to ambulate. Gait steady. Pt able to walk 470 feet without c/o of SOB or pain. BP 111/99 before and 131/95 after walk. Pt back to side of bed after walk with call light in reach. He declines getting in the chair wanted to go back to the side of bed.  Rodney Langton RN 11/03/2019 12:36 PM

## 2019-11-03 NOTE — TOC Initial Note (Signed)
Transition of Care [facility]) - Initial/Assessment Note    Patient Details  Name: Brad [name] MRN: [id number] Date of Birth: 1961-06-22  Transition of Care [facility]) CM/SW Contact:    Zenon Mayo, RN Phone Number: 11/03/2019, 2:54 PM  Clinical Narrative:                 Patient from home with a room mate, which he will not be able to go back and stay with his friend, because friend [place] [address] and not suppose to have anyone staying there. He states he will try his other friend to see if he can stay with him, if not he will try a hotel.  Patient is being followed by paramedicineNira Conn.  He goes to [facility] clinic also.  NCM spoke with [name] at [facility] clinic she states to give patient this phone number along with housing information , Housing Coordinator Re-entry [phone].   Patient states he uses the bus and [employer] for transport.   Plan to dc over the weekend.  Expected Discharge Plan: Homeless Shelter Barriers to Discharge: No Barriers Identified   Patient Goals and CMS Choice Patient states their goals for this hospitalization and ongoing recovery are:: get better   Choice offered to / list presented to : NA  Expected Discharge Plan and Services Expected Discharge Plan: Homeless Shelter   Discharge Planning Services: CM Consult Post Acute Care Choice: NA Living arrangements for the past 2 months: Homeless                 DME Arranged: (NA)         HH Arranged: NA          Prior Living Arrangements/Services Living arrangements for the past 2 months: Homeless Lives with:: Self Patient language and need for interpreter reviewed:: Yes Do you feel safe going back to the place where you live?: Yes      Need for Family Participation in Patient Care: No (Comment) Care giver support system in place?: No (comment) Current home services: (paramedicine - RN) Criminal Activity/Legal Involvement Pertinent to Current Situation/Hospitalization: No - Comment as  needed  Activities of Daily Living Home Assistive Devices/Equipment: None ADL Screening (condition at time of admission) Patient's cognitive ability adequate to safely complete daily activities?: Yes Is the patient deaf or have difficulty hearing?: No Does the patient have difficulty seeing, even when wearing glasses/contacts?: No Does the patient have difficulty concentrating, remembering, or making decisions?: No Patient able to express need for assistance with ADLs?: Yes Does the patient have difficulty dressing or bathing?: No Independently performs ADLs?: Yes (appropriate for developmental age) Does the patient have difficulty walking or climbing stairs?: No Weakness of Legs: Both Weakness of Arms/Hands: None  Permission Sought/Granted                  Emotional Assessment Appearance:: Appears stated age Attitude/Demeanor/Rapport: Engaged Affect (typically observed): Appropriate Orientation: : Oriented to Self, Oriented to Place, Oriented to  Time, Oriented to Situation Alcohol / Substance Use: Not Applicable Psych Involvement: No (comment)  Admission diagnosis:  Acute CHF Patient Active Problem List   Diagnosis Date Noted  . Acute on chronic systolic heart failure ([place]) 10/31/2019  . Acute systolic CHF (congestive heart failure) ([place]) 08/02/2019  . Diarrhea 07/29/2019  . Gastroenteritis 07/22/2019  . Hypomagnesemia 06/19/2019  . Chest pain 06/07/2019  . Elevated troponin 05/23/2019  . Tobacco abuse 05/23/2019  . Alcohol abuse 02/21/2019  . Frequent falls 01/17/2019  .  Severe mitral regurgitation   . Fall 11/01/2018  . Hyponatremia 10/31/2018  . Chronic atrial fibrillation   . Chronic hyponatremia 08/16/2018  . NSVT (nonsustained ventricular tachycardia) ([place])   . Concussion with loss of consciousness   . Scalp laceration   . Trauma   . Permanent atrial fibrillation   . Hypertensive heart disease   . Shortness of breath   . Pyogenic inflammation of bone  ([place])   . Cirrhosis ([place]) 03/23/2018  . Right ankle pain 03/23/2018  . Syncope 08/07/2017  . Acute on chronic combined systolic and diastolic CHF (congestive heart failure) ([place]) 07/09/2017  . Atrial flutter ([place]) 07/09/2017  . Pre-syncope 07/08/2017  . Neuropathy 05/08/2017  . Substance induced mood disorder ([place]) 10/06/2016  . CVA (cerebral vascular accident) ([place]) 09/18/2016  . Left sided numbness   . Homelessness 08/21/2016  . S/P ORIF (open reduction internal fixation) fracture 08/01/2016  . CAD (coronary artery disease), native coronary artery 07/30/2016  . Surgery, elective   . Insomnia 07/22/2016  . NSTEMI (non-ST elevated myocardial infarction) ([place])   . Anemia 07/05/2016  . Thrombocytopenia ([place]) 07/05/2016  . Cocaine abuse ([place]) 07/02/2016  . Chest pain on breathing 07/01/2016  . Essential hypertension 07/01/2016  . DM type 2 (diabetes mellitus, type 2) ([place]) 07/01/2016  . Hypokalemia 07/01/2016  . CKD (chronic kidney disease) stage 3, GFR 30-59 ml/min 07/01/2016  . Painful diabetic neuropathy ([place]) 07/01/2016  . Polysubstance abuse ([place]) 05/27/2016  . Chronic hepatitis C with cirrhosis ([place]) 05/27/2016  . Chronic diastolic congestive heart failure ([place]) 01/31/2016  . Depression 04/21/2012  . GERD (gastroesophageal reflux disease) 02/16/2012  . History of drug abuse ([place])   . Heroin addiction ([place]) 01/29/2012   PCP:  Charlott Rakes, MD Pharmacy:   [place], [place] - [address] [place] [address] Phone: [phone] Fax: [phone]     Social Determinants of Health (SDOH) Interventions    Readmission Risk Interventions Readmission Risk Prevention Plan 11/03/2019 08/03/2019 07/20/2019  Transportation Screening Complete Complete Complete  Medication Review (RN Care Manager) Complete Complete Complete  PCP or Specialist appointment within 3-5 days of discharge Complete - Complete  HRI or Home Care  Consult Complete Not Complete Not Complete  HRI or Home Care Consult Pt Refusal Comments - pt is homeless pt is homeless  SW Recovery Care/Counseling Consult Complete Complete Complete  Palliative Care Screening Not Applicable Not Applicable Not [place] Not Applicable Not Applicable Not Applicable  Some recent data might be hidden

## 2019-11-03 NOTE — Progress Notes (Signed)
Patient ID: Brad [name], male   DOB: 1961-10-05, 58 y.o.   MRN: [id number]     Advanced Heart Failure Rounding Note  PCP-Cardiologist: [name], MD   Subjective:    58 y/o AAM w/ h/o substance abuse, nonischemic cardiomyopathy, chronic systolic HF w/ biventricular dysfunction, EF 20-25%, mild-moderate MR, atrial flutter w/ prior cardioversion, cirrhosis, and diabetes, directly admitted from clinic [id number] for a/c systolic CHF, atrial fibrillation and chest pain.   DCCV on 11/10, maintaining NSR on amiodarone gtt.   He remains on milrinone 0.25 but Lasix gtt was stopped with rise in creatinine to 1.9.  Today, creatinine stable at 1.97.  CVP is 17 today. BP stable.   Feeling much better today. Denies SOB.    Objective:   Weight Range: 83 kg Body mass index is 25.52 kg/m.   Vital Signs:   Temp:  [97.7 F (36.5 C)-97.9 F (36.6 C)] 97.8 F (36.6 C) (11/12 0308) Pulse Rate:  [83-90] 89 (11/12 0308) Resp:  [13-27] 18 (11/12 0308) BP: (100-131)/(65-89) 131/82 (11/12 0308) Weight:  [83 kg] 83 kg (11/12 0308) Last BM Date: 11/02/19  Weight change: Filed Weights   11/01/19 0618 11/02/19 0517 11/03/19 0308  Weight: 79.7 kg 81.7 kg 83 kg    Intake/Output:   Intake/Output Summary (Last 24 hours) at 11/03/2019 0723 Last data filed at 11/03/2019 0317 Gross per 24 hour  Intake 2016.19 ml  Output 1225 ml  Net 791.19 ml      Physical Exam   CVP 17  General: NAD Neck: JVP 14 cm, no thyromegaly or thyroid nodule.  Lungs: Clear to auscultation bilaterally with normal respiratory effort. CV: Lateral PMI.  Heart regular S1/S2, no S3/S4, no murmur.  No peripheral edema.   Abdomen: Soft, nontender, no hepatosplenomegaly, no distention.  Skin: Intact without lesions or rashes.  Neurologic: Alert and oriented x 3.  Psych: Normal affect. Extremities: No clubbing or cyanosis.  HEENT: Normal.    Telemetry   NSR 80-90s personally reviewed.   EKG    N/A  Labs    CBC  Recent Labs    10/31/19 1517 11/01/19 0500  WBC 3.4* 4.9  HGB 15.4 13.6  HCT 46.8 40.8  MCV 93.0 90.7  PLT 231 99991111   Basic Metabolic Panel Recent Labs    11/01/19 0500 11/02/19 0347 11/03/19 0452  NA 134* 135 134*  K 3.5 3.9 3.9  CL 97* 101 101  CO2 27 24 24   GLUCOSE 150* 147* 186*  BUN 23* 32* 31*  CREATININE 1.81* 1.96* 1.97*  CALCIUM 7.9* 7.8* 7.8*  MG 1.4* 2.0  --    Liver Function Tests Recent Labs    10/31/19 1517  AST 33  ALT 24  ALKPHOS 61  BILITOT 0.9  PROT 5.5*  ALBUMIN 1.9*   No results for input(s): LIPASE, AMYLASE in the last 72 hours. Cardiac Enzymes No results for input(s): CKTOTAL, CKMB, CKMBINDEX, TROPONINI in the last 72 hours.  BNP: BNP (last 3 results) Recent Labs    09/30/19 0938 10/24/19 1511 10/31/19 1517  BNP 595.5* 384.8* 679.1*    ProBNP (last 3 results) No results for input(s): PROBNP in the last 8760 hours.   D-Dimer No results for input(s): DDIMER in the last 72 hours. Hemoglobin A1C Recent Labs    11/01/19 0500  HGBA1C 7.5*   Fasting Lipid Panel No results for input(s): CHOL, HDL, LDLCALC, TRIG, CHOLHDL, LDLDIRECT in the last 72 hours. Thyroid Function Tests No results for input(s): TSH, T4TOTAL,  T3FREE, THYROIDAB in the last 72 hours.  Invalid input(s): FREET3  Other results:   Imaging    No results found.   Medications:     Scheduled Medications: . apixaban  5 mg Oral BID  . Chlorhexidine Gluconate Cloth  6 each Topical Daily  . digoxin  0.125 mg Oral Daily  . DULoxetine  60 mg Oral Daily  . gabapentin  400 mg Oral BID  . insulin aspart  0-15 Units Subcutaneous TID WC  . isosorbide-hydrALAZINE  0.5 tablet Oral TID  . metolazone  2.5 mg Oral Once  . potassium chloride  40 mEq Oral Once  . sodium chloride flush  3 mL Intravenous Q12H  . spironolactone  12.5 mg Oral Daily    Infusions: . sodium chloride 10 mL/hr at 11/01/19 1056  . sodium chloride    . amiodarone 30 mg/hr (11/02/19 1923)   . furosemide (LASIX) infusion    . milrinone 0.25 mcg/kg/min (11/02/19 0552)    PRN Medications: sodium chloride, acetaminophen, sodium chloride flush, sodium chloride flush    Patient Profile   58 y/o AAM w/ h/o substance abuse, nonischemic cardiomyopathy, chronic systolic HF w/ biventricular dysfunction, EF 20-25%, mild-moderate MR, atrial flutter w/ prior cardioversion, cirrhosis, and diabetes, directly admitted from clinic [id number] for a/c systolic CHF, atrial fibrillation and chest pain.   Assessment/Plan   1. Acute on chronic systolic CHF: Nonischemic cardiomyopathy.  Cath in 2017 with no significant coronary disease.  Last echo in 3/20 with EF 20-25%, diffuse hypokinesis, mild-moderate MR.  Suspect that substance abuse, heavy ETOH and cocaine have caused his cardiomyopathy.  He feels better back in NSR but is still volume overloaded after Lasix gtt stopped with creatinine rise.  Today, creatinine stable 1.97. No co-ox this morning, CVP 17.  - Continue milrinone 0.25 today.  - Restart Lasix gtt at 12 mg/hr and will give a dose of metolazone 2.5 x 1.   - H/o angioedema with ACEI so not candidate for ACEI or Entresto, may be able to start ARB in future.  - Continue Bidil 1/2 tab tid.   - Continue spironolactone 12.5 daily.  - Can start digoxin 0.125 daily.  - Narrow QRS, not CRT candidate.  Will need to be substance-free for ICD.  2. Atrial fibrillation: Has had flutter in the past, admitted with persistent atrial fibrillation and mild RVR.  DCCV to NSR on 11/10, maintaining NSR on amiodarone gtt. - Continue apixaban 5 mg bid - Continue amiodarone gtt while on milrinone.   3. Cocaine abuse: UDS + for cocaine, thinks marijuana was laced (he says that he did not realize he had used cocaine).   4. ETOH abuse: Still drinks but cutting back.  Encouraged him to stop altogether as this likely contributes to cardiomyopathy.  5. Smoking: Trying to stop.  Encouraged him to quit.  6. Cirrhosis:  Due to HCV and ETOH.  Long-term amiodarone is not ideal.  7. AKI on CKD stage 3: Creatinine up to 1.97, follow closely with diuresis.  8. Chest pain: Suspect slight Hs-TnI elevation is due to CHF exacerbation (demand ischemia).  Prior cath without significant disease.  Suspect chest pain may be related to volume overload.   Loralie Champagne 11/03/2019 7:29 AM

## 2019-11-03 NOTE — Plan of Care (Signed)
?  Problem: Education: ?Goal: Ability to demonstrate management of disease process will improve ?Outcome: Progressing ?  ?Problem: Education: ?Goal: Ability to verbalize understanding of medication therapies will improve ?Outcome: Progressing ?  ?Problem: Cardiac: ?Goal: Ability to achieve and maintain adequate cardiopulmonary perfusion will improve ?Outcome: Progressing ?  ?

## 2019-11-03 NOTE — Plan of Care (Signed)
  Problem: Activity: Goal: Capacity to carry out activities will improve Outcome: Progressing   Problem: Activity: Goal: Risk for activity intolerance will decrease Outcome: Progressing   Problem: Safety: Goal: Ability to remain free from injury will improve Outcome: Progressing   

## 2019-11-04 ENCOUNTER — Encounter (HOSPITAL_COMMUNITY): Payer: Self-pay | Admitting: Cardiology

## 2019-11-04 ENCOUNTER — Telehealth: Payer: Self-pay

## 2019-11-04 LAB — COOXEMETRY PANEL
Carboxyhemoglobin: 1.5 % (ref 0.5–1.5)
Methemoglobin: 1 % (ref 0.0–1.5)
O2 Saturation: 58.6 %
Total hemoglobin: 14.1 g/dL (ref 12.0–16.0)

## 2019-11-04 LAB — BASIC METABOLIC PANEL
Anion gap: 11 (ref 5–15)
BUN: 26 mg/dL — ABNORMAL HIGH (ref 6–20)
CO2: 29 mmol/L (ref 22–32)
Calcium: 7.8 mg/dL — ABNORMAL LOW (ref 8.9–10.3)
Chloride: 95 mmol/L — ABNORMAL LOW (ref 98–111)
Creatinine, Ser: 1.63 mg/dL — ABNORMAL HIGH (ref 0.61–1.24)
GFR calc Af Amer: 53 mL/min — ABNORMAL LOW (ref >60)
GFR calc non Af Amer: 45 mL/min — ABNORMAL LOW (ref >60)
Glucose, Bld: 138 mg/dL — ABNORMAL HIGH (ref 70–99)
Potassium: 3.1 mmol/L — ABNORMAL LOW (ref 3.5–5.1)
Sodium: 135 mmol/L (ref 135–145)

## 2019-11-04 LAB — GLUCOSE, CAPILLARY
Glucose-Capillary: 149 mg/dL — ABNORMAL HIGH (ref 70–99)
Glucose-Capillary: 136 mg/dL — ABNORMAL HIGH (ref 70–99)
Glucose-Capillary: 155 mg/dL — ABNORMAL HIGH (ref 70–99)
Glucose-Capillary: 155 mg/dL — ABNORMAL HIGH (ref 70–99)

## 2019-11-04 MED ORDER — TORSEMIDE 20 MG PO TABS: 80.0000 mg | ORAL_TABLET | Freq: Two times a day (BID) | ORAL | 11 refills | Status: DC

## 2019-11-04 MED ORDER — POTASSIUM CHLORIDE CRYS ER 20 MEQ PO TBCR
40.0000 meq | EXTENDED_RELEASE_TABLET | Freq: Once | ORAL | Status: AC
  Administered 2019-11-04: 11:00:00 40 meq via ORAL
  Filled 2019-11-04: qty 4

## 2019-11-04 MED ORDER — SPIRONOLACTONE 25 MG PO TABS: 25.0000 mg | ORAL_TABLET | Freq: Every day | ORAL | 11 refills | Status: DC

## 2019-11-04 MED ORDER — AMIODARONE HCL 200 MG PO TABS: 200.0000 mg | ORAL_TABLET | Freq: Two times a day (BID) | ORAL | 11 refills | Status: DC

## 2019-11-04 MED ORDER — DIGOXIN 125 MCG PO TABS: 0.1250 mg | ORAL_TABLET | Freq: Every day | ORAL | 11 refills | Status: DC

## 2019-11-04 MED ORDER — POTASSIUM CHLORIDE CRYS ER 20 MEQ PO TBCR
40.0000 meq | EXTENDED_RELEASE_TABLET | Freq: Once | ORAL | Status: AC
  Administered 2019-11-04: 40 meq via ORAL
  Filled 2019-11-04: qty 2

## 2019-11-04 MED ORDER — GABAPENTIN 300 MG PO CAPS: 300.0000 mg | ORAL_CAPSULE | Freq: Two times a day (BID) | ORAL | 3 refills | Status: DC

## 2019-11-04 MED ORDER — SPIRONOLACTONE 25 MG PO TABS
25.0000 mg | ORAL_TABLET | Freq: Every day | ORAL | Status: DC
  Administered 2019-11-04 – 2019-11-05 (×2): 25 mg via ORAL
  Filled 2019-11-04 (×2): qty 1

## 2019-11-04 MED ORDER — ISOSORB DINITRATE-HYDRALAZINE 20-37.5 MG PO TABS
1.0000 | ORAL_TABLET | Freq: Three times a day (TID) | ORAL | Status: DC
  Administered 2019-11-04 – 2019-11-05 (×4): 1 via ORAL
  Filled 2019-11-04 (×4): qty 1

## 2019-11-04 MED ORDER — ISOSORB DINITRATE-HYDRALAZINE 20-37.5 MG PO TABS: 1.0000 | ORAL_TABLET | Freq: Three times a day (TID) | ORAL | 11 refills | Status: DC

## 2019-11-04 MED ORDER — TORSEMIDE 20 MG PO TABS
80.0000 mg | ORAL_TABLET | Freq: Two times a day (BID) | ORAL | Status: DC
  Administered 2019-11-04 – 2019-11-05 (×2): 80 mg via ORAL
  Filled 2019-11-04 (×2): qty 4

## 2019-11-04 MED FILL — SPIRONOLACTONE 25 MG TABLET: 25 | 30 days supply | Qty: 30 | Fill #0

## 2019-11-04 MED FILL — BIDIL TABLET: 20-37.5 | 30 days supply | Qty: 90 | Fill #0

## 2019-11-04 MED FILL — TORSEMIDE 20 MG TABLET: 20 | 30 days supply | Qty: 240 | Fill #0

## 2019-11-04 MED FILL — AMIODARONE HCL 200 MG TAB: 200 | 30 days supply | Qty: 60 | Fill #0

## 2019-11-04 MED FILL — DIGOXIN 0.125 MG TABLET: 125 | 30 days supply | Qty: 30 | Fill #0

## 2019-11-04 MED FILL — GABAPENTIN 300 MG CAPSULE: 300 | 30 days supply | Qty: 60 | Fill #0

## 2019-11-04 NOTE — Progress Notes (Signed)
CARDIAC REHAB PHASE I   PRE:  Rate/Rhythm: 78 SR  BP:  Supine: 90/58  Sitting: 108/70  Standing:    SaO2: 96%RA  MODE:  Ambulation: 470 ft   POST:  Rate/Rhythm: 88 SR  BP:  Supine:   Sitting: 116/76  Standing:    SaO2: 95%RA 0944-1044 Pt walked 470 ft on RA with steady gait and I managed equipment. Pt tolerated walk well. No DOE. Reviewed signs/symptoms of CHF and when to call MD. Discussed daily weights, FR, and low sodium diet. Gave low sodium diets, smoking cessation sheet and ex ed. Discussed CRP 2 and referred to [facility] program. Pt plans to quit smoking. Pt needs scales for daily weights. Does not do APPS for virtual CRP .   Graylon Good, RN BSN  11/04/2019 10:40 AM

## 2019-11-04 NOTE — Telephone Encounter (Signed)
Pt contacted the office requesting a call from the nurse or [facility]

## 2019-11-04 NOTE — Plan of Care (Signed)
?  Problem: Education: ?Goal: Ability to demonstrate management of disease process will improve ?Outcome: Progressing ?  ?Problem: Education: ?Goal: Ability to verbalize understanding of medication therapies will improve ?Outcome: Progressing ?  ?Problem: Cardiac: ?Goal: Ability to achieve and maintain adequate cardiopulmonary perfusion will improve ?Outcome: Progressing ?  ?

## 2019-11-04 NOTE — Telephone Encounter (Signed)
This CM spoke to patient. He has concerns about getting his gabapentin and lantus.  He also said that he plans to go back to his prior apartment if he does not hear back from housing re-entry prior to his discharge.  Call placed to [facility], [place]. He said that patient needs a refill for gabapentin. He has been taking 600 mg twice daily.  He also said that patient should have enough lantus for awhile and medicaid will not allow him to fill anymore unless there is a significant change in dose  Call placed to Tomi Bamberger, RN CM and requested a refill for gabapentin prior to discharge.  The patient is currently not on lantus.   Spoke to patient again and explained above conversations. Instructed him to confirm the order for gabapentin is placed prior to his discharge.  The order should be sent to [facility].  Also informed him that he is not receiving lantus in the hospital and he said he understood.  He then said that he has " enough" lantus at home.  Reminded him that he needs to keep his appt with [facility] this week and he should keep a record of his blood sugars for her to assess at the visit.  11/09/2019.  [name], EMT to arrange SCAT.

## 2019-11-04 NOTE — Discharge Summary (Signed)
Advanced Heart Failure Team  Discharge Summary   Patient ID: Brad [name] MRN: [id number], DOB/AGE: Feb 06, 1961 58 y.o. Admit date: 10/31/2019 D/C date:     11/05/2019   Primary Discharge Diagnoses:  Chronic Systolic HF w/ Biventricular Dysfunction Nonischemic Cardiomyopathy Paroxsymal Atrial Fibrillation H/O Atrial Flutter  Chronic Anticoagulation (Eliquis)  Substance Abuse (+UDS for cocaine 11/02/19) Tobacco Abuse ETOH Abuse  Cirrhosis Diabetes  CKD, Stage III  Pertinent Past Medical Summary and Hospital Course:   Brad Puryearis a 58 y.o.malewith a history of substance abuse, nonischemic cardiomyopathy, atrial flutter, cirrhosis, and diabetes. He was admitted in 11/19 with atrial flutter/RVR and CHF exacerbation. Echo in 11/19 showed EF 20-25%, moderately decreased RV function, severe biatrial enlargement, and severe MR. He underwent TEE-guided DCCV back to NSR and was started on Eliquis. He was admitted in 3/20 again with CHF exacerbation. Echo again showed EF 20-25% but now with mild-moderate MR. He was cocaine-positive both admissions and reported heavy ETOH abuse as well. He was in NSR on last ECG in 4/20.   Currently, he is being helped with his medications by paramedicine program.   He was seen in clinic for f/u of CHF on 10/31/19.It was noted he had reverted back to atrial fibrillation recently and has been more short of breath. He had been to the ER on 10/22 and 11/2, sent home both times. He reported he has been very short of breath with "any exertion." In clinic he was mildly tachypneic and looked uncomfortable. He endorsed having "constant" chest pain for about a month. Troponin was slightly elevated with no trend when he had been to the ER. He was noted to be on diltiazem (apparently started by his PCP) and no longer on Coreg or Bidil. Torsemide had been increased recently but w/o improvement in symptoms. Was volume overloaded on exam. Still smoking 5  cigarettes/day and one 12 ounce beer/day. He denied cocaine use but UDS was positive. EKG showed afib w/ RVR in the low 100s. He was directly admitted from clinic to [facility] for management of his recurrent atrial flutter and diuresis for a/c systolic HF.   He was placed on IV amiodarone for afib. Eliquis was continued on admit (reported full compliance over the last 30 days). Cardizem, which had been initiated by his PCP was discontinued. Hs troponin was trended given CP. Level was flat and low level, not c/w ACS. Felt to be demand ischemia from CHF/ volume overload. No indication for ischemic w/u.  He was started on IV lasix but poor initial response to diuretics. PICC lined placed and found to have low Co-ox at 49%. Started on milrinone to help w/ CO and diuresis. Transitioned to lasix gtt. Metolazone also added as adjunct to help w/ diuresis.  Diuresis improved w/ change of therapy. Milrinone weaned off. Co-ox 65. Transitioned back to PO diuretics.   For treatment of his afib, he he underwent successful DCCV on 11/10 and maintained NSR on IV amiodarone.   On 11/14, he was seen and examined by Dr. Aundra Dubin who felt he was stable for discharge home.  His CVP was down to 2-3.  Therefore, his home Torsemide dose was reduced to 80 mg in A and 40 mg in P.  Home DC meds:  Torsemide 80/40, BiDil 1 three times a day, Spironolactone 25 mg once daily, Digoxin 0.125 mg once daily, Apixaban 5 mg twice daily, Amiodarone 200 mg twice daily x 1 week, then 200 mg once daily.  Of note, Amiodarone long term is not  ideal given hx of cirrhosis in setting of Hep C and alcohol abuse.  Follow up 11/24 in CHF Clinic as planned.   Discharge Weight Range:  Last 3 Weights 11/05/2019 11/04/2019 11/03/2019  Weight (lbs) 167 lb 12.8 oz 171 lb 11.2 oz 183 lb  Weight (kg) 76.114 kg 77.883 kg 83.008 kg  Some encounter information is confidential and restricted. Go to Review Flowsheets activity to see all data.      Discharge Vitals:  Blood pressure 111/75, pulse 77, temperature (!) 97.5 F (36.4 C), temperature source Oral, resp. rate 16, height 5\' 11"  (1.803 m), weight 76.1 kg, SpO2 99 %.  Labs: Lab Results  Component Value Date   WBC 4.9 11/01/2019   HGB 13.6 11/01/2019   HCT 40.8 11/01/2019   MCV 90.7 11/01/2019   PLT 212 11/01/2019    Recent Labs  Lab 10/31/19 1517  11/05/19 0500  NA 136   < > 133*  K 3.4*   < > 3.3*  CL 101   < > 92*  CO2 22   < > 30  BUN 14   < > 24*  CREATININE 1.29*   < > 1.60*  CALCIUM 7.7*   < > 7.5*  PROT 5.5*  --   --   BILITOT 0.9  --   --   ALKPHOS 61  --   --   ALT 24  --   --   AST 33  --   --   GLUCOSE 118*   < > 123*   < > = values in this interval not displayed.   Lab Results  Component Value Date   CHOL 142 08/22/2019   HDL 48 08/22/2019   LDLCALC 74 08/22/2019   TRIG 98 08/22/2019   BNP (last 3 results) Recent Labs    09/30/19 0938 10/24/19 1511 10/31/19 1517  BNP 595.5* 384.8* 679.1*    ProBNP (last 3 results) No results for input(s): PROBNP in the last 8760 hours.   Diagnostic Studies/Procedures   No results found.  Discharge Medications   Allergies as of 11/05/2019      Reactions   Angiotensin Receptor Blockers Anaphylaxis, Other (See Comments)   (Angioedema also with Lisinopril, therefore ARB's are contraindicated)   Lisinopril Anaphylaxis, Other (See Comments)   Throat swells   Pamelor [nortriptyline Hcl] Anaphylaxis, Swelling   Throat swells      Medication List    STOP taking these medications   Accu-Chek Aviva Plus test strip Generic drug: glucose blood   amitriptyline 10 MG tablet Commonly known as: ELAVIL   diltiazem 30 MG tablet Commonly known as: CARDIZEM   isosorbide mononitrate 30 MG 24 hr tablet Commonly known as: IMDUR   sildenafil 50 MG tablet Commonly known as: VIAGRA     TAKE these medications   albuterol 108 (90 Base) MCG/ACT inhaler Commonly known as: VENTOLIN HFA Inhale 2 puffs into the lungs every  6 (six) hours as needed for shortness of breath.   amiodarone 200 MG tablet Commonly known as: Pacerone Take 1 tablet (200 mg total) by mouth 2 (two) times daily for 7 days, THEN 1 tablet (200 mg total) daily. Start taking on: November 05, 2019   apixaban 5 MG Tabs tablet Commonly known as: Eliquis Take 1 tablet (5 mg total) by mouth 2 (two) times daily.   digoxin 0.125 MG tablet Commonly known as: LANOXIN Take 1 tablet (0.125 mg total) by mouth daily.   DULoxetine 60 MG capsule Commonly known as:  CYMBALTA Take 1 capsule (60 mg total) by mouth daily.   gabapentin 300 MG capsule Commonly known as: NEURONTIN Take 1 capsule (300 mg total) by mouth 2 (two) times daily. What changed: how much to take   isosorbide-hydrALAZINE 20-37.5 MG tablet Commonly known as: BIDIL Take 1 tablet by mouth 3 (three) times daily.   potassium chloride SA 20 MEQ tablet Commonly known as: KLOR-CON Take 1 tablet (20 mEq total) by mouth daily. What changed: how much to take   spironolactone 25 MG tablet Commonly known as: ALDACTONE Take 1 tablet (25 mg total) by mouth daily.   torsemide 20 MG tablet Commonly known as: DEMADEX Take 4 tabs (80 mg) in the morning and 2 tabs (40 mg) in the afternoon/evening What changed:   how much to take  how to take this  when to take this  additional instructions       Disposition   The patient will be discharged in stable condition to home. Discharge Instructions    Amb Referral to Cardiac Rehabilitation   Complete by: As directed    Diagnosis: Heart Failure (see criteria below if ordering Phase II)   Heart Failure Type: Chronic Systolic   After initial evaluation and assessments completed: Virtual Based Care may be provided alone or in conjunction with Phase 2 Cardiac Rehab based on patient barriers.: Yes     Follow-up Information    [place]. Go on 11/09/2019.   Why: @11 :10am Contact information: [place] [phone] [facility] Follow up on 11/15/2019.   Specialty: Cardiology Why: 10:00 AM (Dr. Claris Gladden PA) garage parking code [place] information: [address] [id number] [place] [phone] [phone]            Duration of Discharge Encounter: Greater than 35 minutes   Signed, Richardson Dopp, PA-C 11/05/2019, 12:08 PM

## 2019-11-04 NOTE — TOC Progression Note (Signed)
Transition of Care [facility]) - Progression Note    Patient Details  Name: Brad [name] MRN: [id number] Date of Birth: [birth year]  [facility]) CM/SW Contact  Zenon Mayo, RN Phone Number: 11/04/2019, 9:31 PM  Clinical Narrative:    NCM received information from [place] with CHW clinic that patient needs refill of gabapentin prior to dc, NCM informed [place] PA with HF and she states this will be refilled for patient.    Expected Discharge Plan: Homeless Shelter Barriers to Discharge: No Barriers Identified  Expected Discharge Plan and Services Expected Discharge Plan: Homeless Shelter   Discharge Planning Services: CM Consult Post Acute Care Choice: NA Living arrangements for the past 2 months: Homeless                 DME Arranged: (NA)         HH Arranged: NA           Social Determinants of Health (SDOH) Interventions    Readmission Risk Interventions Readmission Risk Prevention Plan 11/03/2019 08/03/2019 07/20/2019  Transportation Screening Complete Complete Complete  Medication Review Press photographer) Complete Complete Complete  PCP or Specialist appointment within 3-5 days of discharge Complete - Complete  HRI or Home Care Consult Complete Not Complete Not Complete  HRI or Home Care Consult Pt Refusal Comments - pt is homeless pt is homeless  SW Recovery Care/Counseling Consult Complete Complete Complete  Palliative Care Screening Not Applicable Not Applicable Not [place] Not Applicable Not Applicable Not Applicable  Some recent data might be hidden

## 2019-11-04 NOTE — Progress Notes (Signed)
Patient ID: Brad [name], male   DOB: 1961/12/03, 58 y.o.   MRN: [id number]     Advanced Heart Failure Rounding Note  PCP-Cardiologist: [name], MD   Subjective:    Brad [name] w/ h/o substance abuse, nonischemic cardiomyopathy, chronic systolic HF w/ biventricular dysfunction, EF 20-25%, mild-moderate MR, atrial flutter w/ prior cardioversion, cirrhosis, and diabetes, directly admitted from clinic [id number] for a/c systolic CHF, atrial fibrillation and chest pain.   DCCV on 11/10, maintaining NSR on amiodarone gtt.   He remains on milrinone 0.25 mcg/kg/min with Lasix gtt 12 mg/hr.  Excellent diuresis yesterday, weight down.  This morning, co-ox 59% with CVP 7.    Feeling much better today. Denies SOB.    Objective:   Weight Range: 77.9 kg Body mass index is 23.95 kg/m.   Vital Signs:   Temp:  [97.7 F (36.5 C)-98.6 F (37 C)] 97.9 F (36.6 C) (11/13 0733) Pulse Rate:  [78-85] 78 (11/13 0733) Resp:  [12-40] 18 (11/13 0733) BP: (113-146)/(Brad-101) 146/91 (11/13 0733) SpO2:  [95 %-98 %] 96 % (11/13 0733) Weight:  [77.9 kg] 77.9 kg (11/13 0439) Last BM Date: 11/03/19  Weight change: Filed Weights   11/02/19 0517 11/03/19 0308 11/04/19 0439  Weight: 81.7 kg 83 kg 77.9 kg    Intake/Output:   Intake/Output Summary (Last 24 hours) at 11/04/2019 0810 Last data filed at 11/04/2019 0735 Gross per 24 hour  Intake 1936.54 ml  Output 8010 ml  Net -6073.46 ml      Physical Exam   CVP 7  General: NAD Neck: No JVD, no thyromegaly or thyroid nodule.  Lungs: Clear to auscultation bilaterally with normal respiratory effort. CV: Lateral PMI.  Heart regular S1/S2, no S3/S4, no murmur.  No peripheral edema.   Abdomen: Soft, nontender, no hepatosplenomegaly, no distention.  Skin: Intact without lesions or rashes.  Neurologic: Alert and oriented x 3.  Psych: Normal affect. Extremities: No clubbing or cyanosis.  HEENT: Normal.    Telemetry   NSR 80-90s personally  reviewed.   EKG    N/A  Labs    CBC No results for input(s): WBC, NEUTROABS, HGB, HCT, MCV, PLT in the last 72 hours. Basic Metabolic Panel Recent Labs    11/02/19 0347 11/03/19 0452 11/04/19 0420  NA 135 134* 135  K 3.9 3.9 3.1*  CL 101 101 95*  CO2 24 24 29   GLUCOSE 147* 186* 138*  BUN 32* 31* 26*  CREATININE 1.96* 1.97* 1.63*  CALCIUM 7.8* 7.8* 7.8*  MG 2.0  --   --    Liver Function Tests No results for input(s): AST, ALT, ALKPHOS, BILITOT, PROT, ALBUMIN in the last 72 hours. No results for input(s): LIPASE, AMYLASE in the last 72 hours. Cardiac Enzymes No results for input(s): CKTOTAL, CKMB, CKMBINDEX, TROPONINI in the last 72 hours.  BNP: BNP (last 3 results) Recent Labs    09/30/19 0938 10/24/19 1511 10/31/19 1517  BNP 595.5* 384.8* 679.1*    ProBNP (last 3 results) No results for input(s): PROBNP in the last 8760 hours.   D-Dimer No results for input(s): DDIMER in the last 72 hours. Hemoglobin A1C No results for input(s): HGBA1C in the last 72 hours. Fasting Lipid Panel No results for input(s): CHOL, HDL, LDLCALC, TRIG, CHOLHDL, LDLDIRECT in the last 72 hours. Thyroid Function Tests No results for input(s): TSH, T4TOTAL, T3FREE, THYROIDAB in the last 72 hours.  Invalid input(s): FREET3  Other results:   Imaging    No results found.  Medications:     Scheduled Medications: . apixaban  5 mg Oral BID  . Chlorhexidine Gluconate Cloth  6 each Topical Daily  . digoxin  0.125 mg Oral Daily  . DULoxetine  60 mg Oral Daily  . gabapentin  400 mg Oral BID  . insulin aspart  0-15 Units Subcutaneous TID WC  . isosorbide-hydrALAZINE  1 tablet Oral TID  . potassium chloride  40 mEq Oral Once  . potassium chloride  40 mEq Oral Once  . sodium chloride flush  3 mL Intravenous Q12H  . spironolactone  25 mg Oral Daily    Infusions: . sodium chloride 10 mL/hr at 11/01/19 1056  . sodium chloride    . amiodarone 30 mg/hr (11/04/19 [id number])  .  furosemide (LASIX) infusion 12 mg/hr (11/04/19 [id number])  . milrinone 0.25 mcg/kg/min (11/03/19 1726)    PRN Medications: sodium chloride, acetaminophen, sodium chloride flush, sodium chloride flush    Patient Profile   Brad [name] w/ h/o substance abuse, nonischemic cardiomyopathy, chronic systolic HF w/ biventricular dysfunction, EF 20-25%, mild-moderate MR, atrial flutter w/ prior cardioversion, cirrhosis, and diabetes, directly admitted from clinic [id number] for a/c systolic CHF, atrial fibrillation and chest pain.   Assessment/Plan   1. Acute on chronic systolic CHF: Nonischemic cardiomyopathy.  Cath in 2017 with no significant coronary disease.  Last echo in 3/20 with EF 20-25%, diffuse hypokinesis, mild-moderate MR.  Suspect that substance abuse, heavy ETOH and cocaine have caused his cardiomyopathy.  Good diuresis yesterday on milrinone + Lasix gtt and metolazone.  Creatinine down to 1.6, weight down.  CVP 7 and co-ox 59%.  - Decrease milrinone to 0.125 mcg/kg/min, hopefully off tomorrow.  - Stop Lasix gtt, start torsemide 80 mg bid.    - H/o angioedema with ACEI so not candidate for ACEI or Entresto, may be able to start ARB in future.  - Increase Bidil to 1 tab tid.    - Increase spironolactone to 25 mg daily.  - Continue digoxin 0.125 daily.  - Narrow QRS, not CRT candidate.  Will need to be substance-free for ICD.  2. Atrial fibrillation: Has had flutter in the past, admitted with persistent atrial fibrillation and mild RVR.  DCCV to NSR on 11/10, maintaining NSR on amiodarone gtt. - Continue apixaban 5 mg bid - Continue amiodarone gtt while on milrinone, change to po when milrinone stopped tomorrow.   3. Cocaine abuse: UDS + for cocaine, thinks marijuana was laced (he says that he did not realize he had used cocaine).   4. ETOH abuse: Still drinks but cutting back.  Encouraged him to stop altogether as this likely contributes to cardiomyopathy.  5. Smoking: Trying to stop.  Encouraged  him to quit.  6. Cirrhosis: Due to HCV and ETOH.  Long-term amiodarone is not ideal.  7. AKI on CKD stage 3: Creatinine down to 1.6, follow closely with diuresis.  8. Chest pain: Suspect slight Hs-TnI elevation is due to CHF exacerbation (demand ischemia).  Prior cath without significant disease.  Suspect chest pain may be related to volume overload.   Hopefully stop milrinone tomorrow am and home in afternoon.  Will need help with meds.  Would get paramedicine.   Loralie Champagne 11/04/2019 8:10 AM

## 2019-11-05 LAB — GLUCOSE, CAPILLARY
Glucose-Capillary: 155 mg/dL — ABNORMAL HIGH (ref 70–99)
Glucose-Capillary: 97 mg/dL (ref 70–99)
Glucose-Capillary: 147 mg/dL — ABNORMAL HIGH (ref 70–99)

## 2019-11-05 LAB — BASIC METABOLIC PANEL
Anion gap: 11 (ref 5–15)
BUN: 24 mg/dL — ABNORMAL HIGH (ref 6–20)
CO2: 30 mmol/L (ref 22–32)
Calcium: 7.5 mg/dL — ABNORMAL LOW (ref 8.9–10.3)
Chloride: 92 mmol/L — ABNORMAL LOW (ref 98–111)
Creatinine, Ser: 1.6 mg/dL — ABNORMAL HIGH (ref 0.61–1.24)
GFR calc Af Amer: 54 mL/min — ABNORMAL LOW (ref >60)
GFR calc non Af Amer: 46 mL/min — ABNORMAL LOW (ref >60)
Glucose, Bld: 123 mg/dL — ABNORMAL HIGH (ref 70–99)
Potassium: 3.3 mmol/L — ABNORMAL LOW (ref 3.5–5.1)
Sodium: 133 mmol/L — ABNORMAL LOW (ref 135–145)

## 2019-11-05 LAB — COOXEMETRY PANEL
Carboxyhemoglobin: 1.6 % — ABNORMAL HIGH (ref 0.5–1.5)
Methemoglobin: 1 % (ref 0.0–1.5)
O2 Saturation: 65.3 %
Total hemoglobin: 13.4 g/dL (ref 12.0–16.0)

## 2019-11-05 MED ORDER — TORSEMIDE 20 MG PO TABS: 40.0000 mg | ORAL_TABLET | Freq: Every evening | ORAL | Status: DC

## 2019-11-05 MED ORDER — AMIODARONE HCL 200 MG PO TABS
200.0000 mg | ORAL_TABLET | Freq: Two times a day (BID) | ORAL | Status: DC
  Administered 2019-11-05: 200 mg via ORAL
  Filled 2019-11-05: qty 1

## 2019-11-05 MED ORDER — POTASSIUM CHLORIDE CRYS ER 20 MEQ PO TBCR
40.0000 meq | EXTENDED_RELEASE_TABLET | Freq: Once | ORAL | Status: AC
  Administered 2019-11-05: 40 meq via ORAL
  Filled 2019-11-05: qty 2

## 2019-11-05 MED ORDER — GABAPENTIN 300 MG PO CAPS: 300.0000 mg | ORAL_CAPSULE | Freq: Two times a day (BID) | ORAL | 3 refills | Status: DC

## 2019-11-05 MED ORDER — AMIODARONE HCL 200 MG PO TABS: ORAL_TABLET | ORAL | 11 refills | Status: DC

## 2019-11-05 MED ORDER — TORSEMIDE 20 MG PO TABS: 80.0000 mg | ORAL_TABLET | Freq: Every day | ORAL | Status: DC

## 2019-11-05 MED ORDER — POTASSIUM CHLORIDE CRYS ER 20 MEQ PO TBCR: 20.0000 meq | EXTENDED_RELEASE_TABLET | Freq: Every day | ORAL | 6 refills | Status: DC

## 2019-11-05 MED ORDER — TORSEMIDE 20 MG PO TABS: ORAL_TABLET | ORAL | 11 refills | Status: DC

## 2019-11-05 NOTE — Progress Notes (Signed)
PICC line removed per order and line is intact. Vaseline gauze and gauze dressing applied and pressure held. Site clean, dry, and intact. Patient and RN aware that patient is on bedrest for 30 minutes. Patient aware to leave dressing on and dry for 24 hours.  

## 2019-11-05 NOTE — Progress Notes (Signed)
Discharge instructions given to patient, all questions and concerns answered to his satisfaction. Patient is waiting for his taxi voucher and IV team team to come remove his PICC line .

## 2019-11-05 NOTE — Discharge Instructions (Signed)
See medication list for medication instructions.  There were some changes since the medications were filled by the pharmacy here.  Make sure you take Torsemide 80 mg in the morning and 40 mg in the evening. Make sure you take Potassium 20 mEq once daily. Make sure you take Amiodarone 200 mg twice daily for 1 week, then 200 mg once daily.  DO NOT TAKE Sildenafil (Viagra).

## 2019-11-05 NOTE — Discharge Planning (Signed)
CSW was alerted that patient needed a taxi voucher. CSW spoke to patient and was informed that his ride came to get him however it was taking too long and they left. CSW verified patient's address and left voucher at nurse's station.

## 2019-11-05 NOTE — Progress Notes (Signed)
Patient took his leads off because MD told him he was going home today, patient is educated about putting his leads back on until discharge orders are put in epic but is refusing. Will continue to monitor patient.

## 2019-11-05 NOTE — Progress Notes (Addendum)
Patient ID: Brad [name], male   DOB: 01/11/1961, 58 y.o.   MRN: [id number]     Advanced Heart Failure Rounding Note  PCP-Cardiologist: [name], MD   Subjective:    58 y/o AAM w/ h/o substance abuse, nonischemic cardiomyopathy, chronic systolic HF w/ biventricular dysfunction, EF 20-25%, mild-moderate MR, atrial flutter w/ prior cardioversion, cirrhosis, and diabetes, directly admitted from clinic [id number] for a/c systolic CHF, atrial fibrillation and chest pain.   DCCV on 11/10, maintaining NSR on amiodarone gtt.   He remains on milrinone 0.125 mcg/kg/min and off IV Lasix.  Co-ox 65%, CVP 2-3.    Feeling much better today. Denies SOB.    Objective:   Weight Range: 76.1 kg Body mass index is 23.4 kg/m.   Vital Signs:   Temp:  [97.5 F (36.4 C)-98.3 F (36.8 C)] 98.1 F (36.7 C) (11/14 0755) Pulse Rate:  [78-84] 78 (11/14 0755) Resp:  [16-21] 16 (11/14 0755) BP: (97-132)/(70-93) 126/88 (11/14 0755) SpO2:  [93 %-98 %] 93 % (11/14 0755) Weight:  [76.1 kg] 76.1 kg (11/14 0505) Last BM Date: 11/04/19  Weight change: Filed Weights   11/03/19 0308 11/04/19 0439 11/05/19 0505  Weight: 83 kg 77.9 kg 76.1 kg    Intake/Output:   Intake/Output Summary (Last 24 hours) at 11/05/2019 1110 Last data filed at 11/05/2019 0936 Gross per 24 hour  Intake 2523.48 ml  Output 1550 ml  Net 973.48 ml      Physical Exam   CVP 2-3  General: NAD Neck: No JVD, no thyromegaly or thyroid nodule.  Lungs: Clear to auscultation bilaterally with normal respiratory effort. CV: Nondisplaced PMI.  Heart regular S1/S2, no S3/S4, no murmur.  No peripheral edema.   Abdomen: Soft, nontender, no hepatosplenomegaly, no distention.  Skin: Intact without lesions or rashes.  Neurologic: Alert and oriented x 3.  Psych: Normal affect. Extremities: No clubbing or cyanosis.  HEENT: Normal.   Telemetry   NSR 80-90s personally reviewed.   EKG    N/A  Labs    CBC No results for input(s):  WBC, NEUTROABS, HGB, HCT, MCV, PLT in the last 72 hours. Basic Metabolic Panel Recent Labs    11/04/19 0420 11/05/19 0500  NA 135 133*  K 3.1* 3.3*  CL 95* 92*  CO2 29 30  GLUCOSE 138* 123*  BUN 26* 24*  CREATININE 1.63* 1.60*  CALCIUM 7.8* 7.5*   Liver Function Tests No results for input(s): AST, ALT, ALKPHOS, BILITOT, PROT, ALBUMIN in the last 72 hours. No results for input(s): LIPASE, AMYLASE in the last 72 hours. Cardiac Enzymes No results for input(s): CKTOTAL, CKMB, CKMBINDEX, TROPONINI in the last 72 hours.  BNP: BNP (last 3 results) Recent Labs    09/30/19 0938 10/24/19 1511 10/31/19 1517  BNP 595.5* 384.8* 679.1*    ProBNP (last 3 results) No results for input(s): PROBNP in the last 8760 hours.   D-Dimer No results for input(s): DDIMER in the last 72 hours. Hemoglobin A1C No results for input(s): HGBA1C in the last 72 hours. Fasting Lipid Panel No results for input(s): CHOL, HDL, LDLCALC, TRIG, CHOLHDL, LDLDIRECT in the last 72 hours. Thyroid Function Tests No results for input(s): TSH, T4TOTAL, T3FREE, THYROIDAB in the last 72 hours.  Invalid input(s): FREET3  Other results:   Imaging    No results found.   Medications:     Scheduled Medications: . apixaban  5 mg Oral BID  . Chlorhexidine Gluconate Cloth  6 each Topical Daily  . digoxin  0.125 mg Oral Daily  . DULoxetine  60 mg Oral Daily  . gabapentin  400 mg Oral BID  . insulin aspart  0-15 Units Subcutaneous TID WC  . isosorbide-hydrALAZINE  1 tablet Oral TID  . sodium chloride flush  3 mL Intravenous Q12H  . spironolactone  25 mg Oral Daily  . torsemide  80 mg Oral BID    Infusions: . sodium chloride 10 mL/hr at 11/01/19 1056  . sodium chloride    . amiodarone 30 mg/hr (11/05/19 0300)    PRN Medications: sodium chloride, acetaminophen, sodium chloride flush, sodium chloride flush    Patient Profile   58 y/o AAM w/ h/o substance abuse, nonischemic cardiomyopathy, chronic  systolic HF w/ biventricular dysfunction, EF 20-25%, mild-moderate MR, atrial flutter w/ prior cardioversion, cirrhosis, and diabetes, directly admitted from clinic [id number] for a/c systolic CHF, atrial fibrillation and chest pain.   Assessment/Plan   1. Acute on chronic systolic CHF: Nonischemic cardiomyopathy.  Cath in 2017 with no significant coronary disease.  Last echo in 3/20 with EF 20-25%, diffuse hypokinesis, mild-moderate MR.  Suspect that substance abuse, heavy ETOH and cocaine have caused his cardiomyopathy.  Good diuresis, CVP down to 2-3.  Creatinine 1.6.  Co-ox 65%.  - Stop milrinone today.  - Torsemide 80 qam/40 qpm for home.     - H/o angioedema with ACEI so not candidate for ACEI or Entresto, may be able to start ARB in future.  - Continue Bidil 1 tab tid.    - Continue spironolactone 25 mg daily.  - Continue digoxin 0.125 daily.  - Narrow QRS, not CRT candidate.  Will need to be substance-free for ICD.  2. Atrial fibrillation: Has had flutter in the past, admitted with persistent atrial fibrillation and mild RVR.  DCCV to NSR on 11/10, maintaining NSR on amiodarone gtt. - Continue apixaban 5 mg bid - Stop amiodarone gtt, start amiodarone 200 mg bid.  3. Cocaine abuse: UDS + for cocaine, thinks marijuana was laced (he says that he did not realize he had used cocaine).   4. ETOH abuse: Still drinks but cutting back.  Encouraged him to stop altogether as this likely contributes to cardiomyopathy.  5. Smoking: Trying to stop.  Encouraged him to quit.  6. Cirrhosis: Due to HCV and ETOH.  Long-term amiodarone is not ideal.  7. AKI on CKD stage 3: Creatinine down to 1.6, follow closely.  8. Chest pain: Suspect slight Hs-TnI elevation is due to CHF exacerbation (demand ischemia).  Prior cath without significant disease.  Suspect chest pain may be related to volume overload.  9. Disposition: I think he can go home today.  Followup with me in CHF clinic.  Meds for discharge: amiodarone 200  mg bid x 1 week then 200 mg daily, Bidil 1 tab tid, digoxin 0.125 daily, torsemide 80 qam/40 qpm, spironolactone 25, apixaban 5 mg bid., KCl 20 daily.   Loralie Champagne 11/05/2019 11:10 AM

## 2019-11-07 ENCOUNTER — Telehealth (HOSPITAL_COMMUNITY): Payer: Self-pay

## 2019-11-07 ENCOUNTER — Other Ambulatory Visit (HOSPITAL_COMMUNITY): Payer: Self-pay

## 2019-11-07 NOTE — Telephone Encounter (Signed)
SCAT rides scheduled and confirmed for [place].    11/18 [facility] appointment at 11:10 SCAT pick up- 10:15-1045 SCAT return- 12:30-13:10  11/23- to be scheduled tomorrow.

## 2019-11-07 NOTE — Telephone Encounter (Signed)
Transition Care Management Follow-up Telephone Call Date of discharge and from where: 11/05/2019, [facility] .  Call placed to patient # [phone], message left with call back requested to this CM

## 2019-11-07 NOTE — Progress Notes (Signed)
Paramedicine Encounter    Patient ID: Brad [name], male    DOB: [birth year], 58 y.o.   MRN: [id number]   Patient Care Team: Charlott Rakes, MD as PCP - General (Family Medicine) Debara Pickett Nadean Corwin, MD as PCP - Cardiology (Cardiology) Charlott Rakes, MD (Family Medicine)  Patient Active Problem List   Diagnosis Date Noted  . Acute on chronic systolic heart failure ([place]) 10/31/2019  . Acute systolic CHF (congestive heart failure) ([place]) 08/02/2019  . Diarrhea 07/29/2019  . Gastroenteritis 07/22/2019  . Hypomagnesemia 06/19/2019  . Chest pain 06/07/2019  . Elevated troponin 05/23/2019  . Tobacco abuse 05/23/2019  . Alcohol abuse 02/21/2019  . Frequent falls 01/17/2019  . Severe mitral regurgitation   . Fall 11/01/2018  . Hyponatremia 10/31/2018  . Chronic atrial fibrillation   . Chronic hyponatremia 08/16/2018  . NSVT (nonsustained ventricular tachycardia) ([place])   . Concussion with loss of consciousness   . Scalp laceration   . Trauma   . Permanent atrial fibrillation   . Hypertensive heart disease   . Shortness of breath   . Pyogenic inflammation of bone ([place])   . Cirrhosis ([place]) 03/23/2018  . Right ankle pain 03/23/2018  . Syncope 08/07/2017  . Acute on chronic combined systolic and diastolic CHF (congestive heart failure) ([place]) 07/09/2017  . Atrial flutter ([place]) 07/09/2017  . Pre-syncope 07/08/2017  . Neuropathy 05/08/2017  . Substance induced mood disorder ([place]) 10/06/2016  . CVA (cerebral vascular accident) ([place]) 09/18/2016  . Left sided numbness   . Homelessness 08/21/2016  . S/P ORIF (open reduction internal fixation) fracture 08/01/2016  . CAD (coronary artery disease), native coronary artery 07/30/2016  . Surgery, elective   . Insomnia 07/22/2016  . NSTEMI (non-ST elevated myocardial infarction) ([place])   . Anemia 07/05/2016  . Thrombocytopenia ([place]) 07/05/2016  . Cocaine abuse ([place]) 07/02/2016  . Chest pain on breathing 07/01/2016  . Essential  hypertension 07/01/2016  . DM type 2 (diabetes mellitus, type 2) ([place]) 07/01/2016  . Hypokalemia 07/01/2016  . CKD (chronic kidney disease) stage 3, GFR 30-59 ml/min 07/01/2016  . Painful diabetic neuropathy ([place]) 07/01/2016  . Polysubstance abuse ([place]) 05/27/2016  . Chronic hepatitis C with cirrhosis ([place]) 05/27/2016  . Chronic diastolic congestive heart failure ([place]) 01/31/2016  . Depression 04/21/2012  . GERD (gastroesophageal reflux disease) 02/16/2012  . History of drug abuse ([place])   . Heroin addiction ([place]) 01/29/2012    Current Outpatient Medications:  .  amiodarone (PACERONE) 200 MG tablet, Take 1 tablet (200 mg total) by mouth 2 (two) times daily for 7 days, THEN 1 tablet (200 mg total) daily., Disp: 31 tablet, Rfl: 11 .  apixaban (ELIQUIS) 5 MG TABS tablet, Take 1 tablet (5 mg total) by mouth 2 (two) times daily., Disp: 60 tablet, Rfl: 6 .  digoxin (LANOXIN) 0.125 MG tablet, Take 1 tablet (0.125 mg total) by mouth daily., Disp: 30 tablet, Rfl: 11 .  DULoxetine (CYMBALTA) 60 MG capsule, Take 1 capsule (60 mg total) by mouth daily., Disp: 30 capsule, Rfl: 6 .  gabapentin (NEURONTIN) 300 MG capsule, Take 1 capsule (300 mg total) by mouth 2 (two) times daily., Disp: 60 capsule, Rfl: 3 .  isosorbide-hydrALAZINE (BIDIL) 20-37.5 MG tablet, Take 1 tablet by mouth 3 (three) times daily., Disp: 90 tablet, Rfl: 11 .  potassium chloride SA (KLOR-CON) 20 MEQ tablet, Take 1 tablet (20 mEq total) by mouth daily., Disp: 45 tablet, Rfl: 6 .  spironolactone (ALDACTONE) 25 MG tablet, Take 1 tablet (25 mg total)  by mouth daily., Disp: 30 tablet, Rfl: 11 .  torsemide (DEMADEX) 20 MG tablet, Take 4 tabs (80 mg) in the morning and 2 tabs (40 mg) in the afternoon/evening, Disp: 240 tablet, Rfl: 11 .  albuterol (VENTOLIN HFA) 108 (90 Base) MCG/ACT inhaler, Inhale 2 puffs into the lungs every 6 (six) hours as needed for shortness of breath. , Disp: , Rfl:  Allergies  Allergen Reactions  . Angiotensin  Receptor Blockers Anaphylaxis and Other (See Comments)    (Angioedema also with Lisinopril, therefore ARB's are contraindicated)  . Lisinopril Anaphylaxis and Other (See Comments)    Throat swells  . Pamelor [Nortriptyline Hcl] Anaphylaxis and Swelling    Throat swells     Social History   Socioeconomic History  . Marital status: Married    Spouse name: Not on file  . Number of children: 3  . Years of education: 2y college  . Highest education level: Not on file  Occupational History  . 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 Financial resource strain: Not on file  . Food insecurity    Worry: Not on file    Inability: Not on file  . Transportation needs    Medical: Not on file    Non-medical: Not on file  Tobacco Use  . Smoking status: Current Every Day Smoker    Packs/day: 0.50    Years: 28.00    Pack years: 14.00    Types: Cigarettes  . Smokeless tobacco: Never Used  Substance and Sexual Activity  . Alcohol use: Yes    Alcohol/week: 1.0 standard drinks    Types: 1 Cans of beer per week    Comment: "I drink ~ 40oz beer/day"  . Drug use: Yes    Types: IV, Cocaine, Heroin    Comment: 08/17/2018 "no heroin since 2013; I use cocaine ~ once/month, last use 03/21/2019  . Sexual activity: Not Currently  Lifestyle  . Physical activity    Days per week: Not on file    Minutes per session: Not on file  . Stress: Not on file  Relationships  . Social Herbalist on phone: Not on file    Gets together: Not on file    Attends religious service: Not on file    Active member of club or organization: Not on file    Attends meetings of clubs or organizations: Not on file    Relationship status: Not on file  . Intimate partner violence    Fear of current or ex partner: Not on file    Emotionally abused: Not on file    Physically abused: Not on file    Forced sexual activity: Not on file  Other Topics Concern  . Not on file   Social History Narrative   ** Merged History Encounter **       Incarcerated from 2006-2010, then 10/2011-12/2011.  Has been trying to get sober (no heroin, alcohol since 10/2011).     Physical Exam Vitals signs reviewed.  HENT:     Head: Normocephalic.     Nose: Nose normal.     Mouth/Throat:     Mouth: Mucous membranes are moist.  Neck:     Musculoskeletal: Normal range of motion.  Cardiovascular:     Rate and Rhythm: Normal rate. Rhythm irregular.     Pulses: Normal pulses.  Pulmonary:     Effort: Pulmonary effort is normal.  Breath sounds: Normal breath sounds.  Abdominal:     Palpations: Abdomen is soft.  Musculoskeletal: Normal range of motion.     Right lower leg: No edema.     Left lower leg: No edema.  Skin:    General: Skin is warm and dry.     Capillary Refill: Capillary refill takes less than 2 seconds.  Neurological:     Mental Status: He is alert. Mental status is at baseline.  Psychiatric:        Mood and Affect: Mood normal.    Arrived for visit for Luci Bank was alert and oriented reporting no pain, illness or injury. Jex stated he is feeling much better after his recent discharge from the hospital. Dolan's vitals as noted. Medications reviewed and confirmed. Pill box filled accordingly. Medications patient removed from HF clinic were discarded. New medications reviewed with patient. Shahrukh understood same. Hunberto was made aware of SCAT rides and appointments confirmed. Aswad agreed to home visit in one week. Home visit complete.    CBG- 108  MEDS ORDERED: Cymbalta Digoxin   Future Appointments  Date Time Provider [place]  11/09/2019 11:10 AM Charlott Rakes, MD [facility] None  11/15/2019 10:00 AM [facility] PA/NP [facility] None     ACTION: Home visit completed

## 2019-11-08 ENCOUNTER — Telehealth: Payer: Self-pay

## 2019-11-08 ENCOUNTER — Telehealth (HOSPITAL_COMMUNITY): Payer: Self-pay

## 2019-11-08 ENCOUNTER — Other Ambulatory Visit (HOSPITAL_COMMUNITY): Payer: Self-pay

## 2019-11-08 MED ORDER — DIGOXIN 125 MCG PO TABS: 0.1250 mg | ORAL_TABLET | Freq: Every day | ORAL | 11 refills | Status: DC

## 2019-11-08 NOTE — Telephone Encounter (Signed)
Pt insurance is active through [place]. [id number]  Will fax over Medicaid Reimbursement form to Dr. Aundra Dubin  Will contact pt to see if he is interested in the Cardiac Rehab program. If interested, pt will need to complete f/u appt on. Once completed, pt will be contacted for scheduling upon review by the RN Navigator.

## 2019-11-08 NOTE — Telephone Encounter (Signed)
Called patient to see if he is interested in the Cardiac Rehab Program. Patient expressed interest. Explained scheduling process and went over insurance, patient verbalized understanding. Will contact patient for scheduling once f/u has been completed.  °

## 2019-11-08 NOTE — Telephone Encounter (Signed)
Transition Care Management Follow-up Telephone Call  Date of discharge and from where: 11/05/2019., [facility]   How have you been since you were released from the hospital? He said he is feeling better  Any questions or concerns? No concerns or questions reported  Items Reviewed:  Did the pt receive and understand the discharge instructions provided? Yes he has the instructions, no questions  Medications obtained and verified? He has medications and [name], EMT met with patient yesterday and reviewed meds and pre-filled medication box. patient had no questions about meds  New allergies ?  None reported   Do you have support at home? Has roommate; but this is temporary.   Looking for permanent housing. Michela Pitcher that he has been in contact with the Housing Coordinated Re-entry program and is waiting for a call back.   Other (ie: DME, Home Health, etc) no home health ordered. Has community paramedicine visit weekly.  Has glucometer  Has been referred to cardiac rehab.  Functional Questionnaire: (I = Independent and D = Dependent) ADL's: independent   Follow up appointments reviewed:    PCP Hospital f/u appt confirmed? 11/09/2019 @ 1110 with Dr Margarita Rana  Specialist appointment?  Has appt with heart failure clinic 11/15/2019 @ 1000; [facility]  - 11/11/2019 @ 1515.    Are transportation arrangements needed? SCAT has been arranged   If their condition worsens, is the pt aware to call  their PCP or go to the ED? Yes or he has been instructed to call the heart failure clinic  Was the patient provided with contact information for the PCP's office or ED? He has the phone number   Was the pt encouraged to call back with questions or concerns? Yes

## 2019-11-09 ENCOUNTER — Other Ambulatory Visit: Payer: Self-pay

## 2019-11-09 ENCOUNTER — Telehealth: Payer: Self-pay

## 2019-11-09 ENCOUNTER — Encounter: Payer: Self-pay | Admitting: Family Medicine

## 2019-11-09 ENCOUNTER — Ambulatory Visit: Payer: Medicaid Other | Attending: Family Medicine | Admitting: Family Medicine

## 2019-11-09 VITALS — BP 128/81 | HR 93 | Temp 98.0°F | Ht 71.0 in | Wt 162.0 lb

## 2019-11-09 DIAGNOSIS — E1140 Type 2 diabetes mellitus with diabetic neuropathy, unspecified: Secondary | ICD-10-CM

## 2019-11-09 DIAGNOSIS — Z794 Long term (current) use of insulin: Secondary | ICD-10-CM

## 2019-11-09 DIAGNOSIS — E876 Hypokalemia: Secondary | ICD-10-CM | POA: Diagnosis not present

## 2019-11-09 DIAGNOSIS — I4821 Permanent atrial fibrillation: Secondary | ICD-10-CM

## 2019-11-09 DIAGNOSIS — E1159 Type 2 diabetes mellitus with other circulatory complications: Secondary | ICD-10-CM

## 2019-11-09 DIAGNOSIS — G4709 Other insomnia: Secondary | ICD-10-CM

## 2019-11-09 DIAGNOSIS — I5042 Chronic combined systolic (congestive) and diastolic (congestive) heart failure: Secondary | ICD-10-CM

## 2019-11-09 DIAGNOSIS — Z79899 Other long term (current) drug therapy: Secondary | ICD-10-CM

## 2019-11-09 DIAGNOSIS — F331 Major depressive disorder, recurrent, moderate: Secondary | ICD-10-CM

## 2019-11-09 DIAGNOSIS — I110 Hypertensive heart disease with heart failure: Secondary | ICD-10-CM

## 2019-11-09 LAB — GLUCOSE, POCT (MANUAL RESULT ENTRY): POC Glucose: 157 mg/dl — AB (ref 70–99)

## 2019-11-09 MED ORDER — GABAPENTIN 300 MG PO CAPS: 600.0000 mg | ORAL_CAPSULE | Freq: Two times a day (BID) | ORAL | 3 refills | Status: DC

## 2019-11-09 MED ORDER — LANTUS SOLOSTAR 100 UNIT/ML ~~LOC~~ SOPN: 3.0000 [IU] | PEN_INJECTOR | Freq: Every day | SUBCUTANEOUS | 3 refills | Status: DC

## 2019-11-09 NOTE — Patient Instructions (Signed)
Major Depressive Disorder, Adult Major depressive disorder (MDD) is a mental health condition. MDD often makes you feel sad, hopeless, or helpless. MDD can also cause symptoms in your body. MDD can affect your:  Work.  School.  Relationships.  Other normal activities. MDD can range from mild to very bad. It may occur once (single episode MDD). It can also occur many times (recurrent MDD). The main symptoms of MDD often include:  Feeling sad, depressed, or irritable most of the time.  Loss of interest. MDD symptoms also include:  Sleeping too much or too little.  Eating too much or too little.  A change in your weight.  Feeling tired (fatigue) or having low energy.  Feeling worthless.  Feeling guilty.  Trouble making decisions.  Trouble thinking clearly.  Thoughts of suicide or harming others.  Feeling weak.  Feeling agitated.  Keeping yourself from being around other people (isolation). Follow these instructions at home: Activity  Do these things as told by your doctor: ? Go back to your normal activities. ? Exercise regularly. ? Spend time outdoors. Alcohol  Talk with your doctor about how alcohol can affect your antidepressant medicines.  Do not drink alcohol. Or, limit how much alcohol you drink. ? This means no more than 1 drink a day for nonpregnant women and 2 drinks a day for men. One drink equals one of these:  12 oz of beer.  5 oz of wine.  1 oz of hard liquor. General instructions  Take over-the-counter and prescription medicines only as told by your doctor.  Eat a healthy diet.  Get plenty of sleep.  Find activities that you enjoy. Make time to do them.  Think about joining a support group. Your doctor may be able to suggest a group for you.  Keep all follow-up visits as told by your doctor. This is important. Where to find more information:  National Alliance on Mental Illness: ? www.nami.org  U.S. National Institute of Mental  Health: ? www.nimh.nih.gov  National Suicide Prevention Lifeline: ? 1-800-273-8255. This is free, 24-hour help. Contact a doctor if:  Your symptoms get worse.  You have new symptoms. Get help right away if:  You self-harm.  You see, hear, taste, smell, or feel things that are not present (hallucinate). If you ever feel like you may hurt yourself or others, or have thoughts about taking your own life, get help right away. You can go to your nearest emergency department or call:  Your local emergency services (911 in the [place]  A suicide crisis helpline, such as the National Suicide Prevention Lifeline: ? 1-800-273-8255. This is open 24 hours a day. This information is not intended to replace advice given to you by your health care provider. Make sure you discuss any questions you have with your health care provider. Document Released: 11/19/2015 Document Revised: 11/20/2017 Document Reviewed: 08/24/2016 Elsevier Patient Education  2020 [employer]  
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
## 2019-11-09 NOTE — Telephone Encounter (Signed)
Call received from patient stating that [facility] is not able to fill his gabapentin.  Call placed to [facility] regarding gabapentin who said that the order can be filled but it is only enough for 2 weeks.  He said that the current order is for 300 mg - take 2 capsules two times daily.  Dispense 60 capsules.  [name] is inquiring if the order can be written to dispense 120 capsules since he is taking 4/day.

## 2019-11-10 ENCOUNTER — Telehealth (HOSPITAL_COMMUNITY): Payer: Self-pay

## 2019-11-10 LAB — BASIC METABOLIC PANEL
BUN/Creatinine Ratio: 13 (ref 9–20)
BUN: 19 mg/dL (ref 6–24)
CO2: 25 mmol/L (ref 20–29)
Calcium: 8.4 mg/dL — ABNORMAL LOW (ref 8.7–10.2)
Chloride: 97 mmol/L (ref 96–106)
Creatinine, Ser: 1.41 mg/dL — ABNORMAL HIGH (ref 0.76–1.27)
GFR calc Af Amer: 63 mL/min/{1.73_m2} (ref >59)
GFR calc non Af Amer: 54 mL/min/{1.73_m2} — ABNORMAL LOW (ref >59)
Glucose: 150 mg/dL — ABNORMAL HIGH (ref 65–99)
Potassium: 4.5 mmol/L (ref 3.5–5.2)
Sodium: 134 mmol/L (ref 134–144)

## 2019-11-10 MED ORDER — GABAPENTIN 300 MG PO CAPS: 600.0000 mg | ORAL_CAPSULE | Freq: Two times a day (BID) | ORAL | 3 refills | Status: DC

## 2019-11-10 NOTE — Telephone Encounter (Signed)
This CM spoke to [facility] who confirmed receipt of the updated order for gabapentin. He said that as well as the cymbalta are ready for patient.   Call placed to patient to inform him medications are ready at the pharmacy.  Message left with call back requested to this CM

## 2019-11-10 NOTE — Telephone Encounter (Signed)
Done

## 2019-11-10 NOTE — Telephone Encounter (Signed)
Call received from patient and informed him that his meds are ready at [facility]

## 2019-11-10 NOTE — Telephone Encounter (Signed)
Received forms from cardiac rehab, signed and faxed back. Confirmation received.

## 2019-11-14 ENCOUNTER — Other Ambulatory Visit: Payer: Self-pay
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
## 2019-11-15 ENCOUNTER — Encounter (HOSPITAL_COMMUNITY): Payer: Medicaid Other

## 2019-11-16 ENCOUNTER — Telehealth: Payer: Self-pay

## 2019-11-16 NOTE — Telephone Encounter (Signed)
Patient has provided authorization for referrals for assistance with housing. Referral placed to [facility] ( transition to community living).

## 2019-11-21 ENCOUNTER — Other Ambulatory Visit (HOSPITAL_COMMUNITY): Payer: Self-pay

## 2019-11-21 ENCOUNTER — Other Ambulatory Visit: Payer: Self-pay | Admitting: Family Medicine

## 2019-11-21 NOTE — Progress Notes (Signed)
Paramedicine Encounter    Patient ID: Brad [name], male    DOB: Jul 21, 1961, 58 y.o.   MRN: [id number]   Patient Care Team: Charlott Rakes, MD as PCP - General (Family Medicine) Debara Pickett Nadean Corwin, MD as PCP - Cardiology (Cardiology) Charlott Rakes, MD (Family Medicine)  Patient Active Problem List   Diagnosis Date Noted  . Acute on chronic systolic heart failure ([place]) 10/31/2019  . Acute systolic CHF (congestive heart failure) ([place]) 08/02/2019  . Diarrhea 07/29/2019  . Gastroenteritis 07/22/2019  . Hypomagnesemia 06/19/2019  . Chest pain 06/07/2019  . Elevated troponin 05/23/2019  . Tobacco abuse 05/23/2019  . Alcohol abuse 02/21/2019  . Frequent falls 01/17/2019  . Severe mitral regurgitation   . Fall 11/01/2018  . Hyponatremia 10/31/2018  . Chronic atrial fibrillation   . Chronic hyponatremia 08/16/2018  . NSVT (nonsustained ventricular tachycardia) ([place])   . Concussion with loss of consciousness   . Scalp laceration   . Trauma   . Permanent atrial fibrillation   . Hypertensive heart disease   . Shortness of breath   . Pyogenic inflammation of bone ([place])   . Cirrhosis ([place]) 03/23/2018  . Right ankle pain 03/23/2018  . Syncope 08/07/2017  . Acute on chronic combined systolic and diastolic CHF (congestive heart failure) ([place]) 07/09/2017  . Atrial flutter ([place]) 07/09/2017  . Pre-syncope 07/08/2017  . Neuropathy 05/08/2017  . Substance induced mood disorder ([place]) 10/06/2016  . CVA (cerebral vascular accident) ([place]) 09/18/2016  . Left sided numbness   . Homelessness 08/21/2016  . S/P ORIF (open reduction internal fixation) fracture 08/01/2016  . CAD (coronary artery disease), native coronary artery 07/30/2016  . Surgery, elective   . Insomnia 07/22/2016  . NSTEMI (non-ST elevated myocardial infarction) ([place])   . Anemia 07/05/2016  . Thrombocytopenia ([place]) 07/05/2016  . Cocaine abuse ([place]) 07/02/2016  . Chest pain on breathing 07/01/2016  . Essential  hypertension 07/01/2016  . DM type 2 (diabetes mellitus, type 2) ([place]) 07/01/2016  . Hypokalemia 07/01/2016  . CKD (chronic kidney disease) stage 3, GFR 30-59 ml/min 07/01/2016  . Painful diabetic neuropathy ([place]) 07/01/2016  . Polysubstance abuse ([place]) 05/27/2016  . Chronic hepatitis C with cirrhosis ([place]) 05/27/2016  . Chronic diastolic congestive heart failure ([place]) 01/31/2016  . Depression 04/21/2012  . GERD (gastroesophageal reflux disease) 02/16/2012  . History of drug abuse ([place])   . Heroin addiction ([place]) 01/29/2012    Current Outpatient Medications:  .  amiodarone (PACERONE) 200 MG tablet, Take 1 tablet (200 mg total) by mouth 2 (two) times daily for 7 days, THEN 1 tablet (200 mg total) daily., Disp: 31 tablet, Rfl: 11 .  apixaban (ELIQUIS) 5 MG TABS tablet, Take 1 tablet (5 mg total) by mouth 2 (two) times daily., Disp: 60 tablet, Rfl: 6 .  digoxin (LANOXIN) 0.125 MG tablet, Take 1 tablet (0.125 mg total) by mouth daily., Disp: 30 tablet, Rfl: 11 .  DULoxetine (CYMBALTA) 60 MG capsule, Take 1 capsule (60 mg total) by mouth daily., Disp: 30 capsule, Rfl: 6 .  gabapentin (NEURONTIN) 300 MG capsule, Take 2 capsules (600 mg total) by mouth 2 (two) times daily., Disp: 120 capsule, Rfl: 3 .  Insulin Glargine (LANTUS SOLOSTAR) 100 UNIT/ML Solostar Pen, Inject 3 Units into the skin daily., Disp: 3 pen, Rfl: 3 .  isosorbide-hydrALAZINE (BIDIL) 20-37.5 MG tablet, Take 1 tablet by mouth 3 (three) times daily., Disp: 90 tablet, Rfl: 11 .  potassium chloride SA (KLOR-CON) 20 MEQ tablet, Take 1 tablet (20 mEq  total) by mouth daily., Disp: 45 tablet, Rfl: 6 .  spironolactone (ALDACTONE) 25 MG tablet, Take 1 tablet (25 mg total) by mouth daily., Disp: 30 tablet, Rfl: 11 .  torsemide (DEMADEX) 20 MG tablet, Take 4 tabs (80 mg) in the morning and 2 tabs (40 mg) in the afternoon/evening, Disp: 240 tablet, Rfl: 11 .  albuterol (VENTOLIN HFA) 108 (90 Base) MCG/ACT inhaler, Inhale 2 puffs into the lungs  every 6 (six) hours as needed for shortness of breath. , Disp: , Rfl:  Allergies  Allergen Reactions  . Angiotensin Receptor Blockers Anaphylaxis and Other (See Comments)    (Angioedema also with Lisinopril, therefore ARB's are contraindicated)  . Lisinopril Anaphylaxis and Other (See Comments)    Throat swells  . Pamelor [Nortriptyline Hcl] Anaphylaxis and Swelling    Throat swells     Social History   Socioeconomic History  . Marital status: Married    Spouse name: Not on file  . Number of children: 3  . Years of education: 2y college  . Highest education level: Not on file  Occupational History  . 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 Financial resource strain: Not on file  . Food insecurity    Worry: Not on file    Inability: Not on file  . Transportation needs    Medical: Not on file    Non-medical: Not on file  Tobacco Use  . Smoking status: Current Every Day Smoker    Packs/day: 0.50    Years: 28.00    Pack years: 14.00    Types: Cigarettes  . Smokeless tobacco: Never Used  Substance and Sexual Activity  . Alcohol use: Yes    Alcohol/week: 1.0 standard drinks    Types: 1 Cans of beer per week    Comment: "I drink ~ 40oz beer/day"  . Drug use: Yes    Types: IV, Cocaine, Heroin    Comment: 08/17/2018 "no heroin since 2013; I use cocaine ~ once/month, last use 03/21/2019  . Sexual activity: Not Currently  Lifestyle  . Physical activity    Days per week: Not on file    Minutes per session: Not on file  . Stress: Not on file  Relationships  . Social Herbalist on phone: Not on file    Gets together: Not on file    Attends religious service: Not on file    Active member of club or organization: Not on file    Attends meetings of clubs or organizations: Not on file    Relationship status: Not on file  . Intimate partner violence    Fear of current or ex partner: Not on file    Emotionally abused: Not on  file    Physically abused: Not on file    Forced sexual activity: Not on file  Other Topics Concern  . Not on file  Social History Narrative   ** Merged History Encounter **       Incarcerated from 2006-2010, then 10/2011-12/2011.  Has been trying to get sober (no heroin, alcohol since 10/2011).     Physical Exam Vitals signs reviewed.  Constitutional:      Appearance: He is normal weight.  HENT:     Head: Normocephalic.     Nose: Nose normal.     Mouth/Throat:     Mouth: Mucous membranes are moist.  Eyes:     Pupils: Pupils are equal, round,  and reactive to light.  Neck:     Musculoskeletal: Normal range of motion.  Cardiovascular:     Rate and Rhythm: Normal rate. Rhythm irregular.     Pulses: Normal pulses.  Pulmonary:     Effort: Pulmonary effort is normal.  Abdominal:     General: Abdomen is flat.     Palpations: Abdomen is soft.  Musculoskeletal: Normal range of motion.     Right lower leg: No edema.     Left lower leg: No edema.  Skin:    General: Skin is warm and dry.     Capillary Refill: Capillary refill takes less than 2 seconds.  Neurological:     Mental Status: He is alert. Mental status is at baseline.  Psychiatric:        Mood and Affect: Mood normal.      Arrived for home visit for Mr. Vegter who was alert and oriented with no complaints. Xeng reports "feeling great". He stated he has been compliant with his medications however pill box noted to have multiple PM doses missing. Patient reports this is due to falling asleep early and sleeping through the whole night. I educated Mr. Lavelle on the importance of taking all of his medications AM and PM. He states he understood and would do better. Pills verified and pill box filled accordingly. Vitals obtained as noted and physical exam as noted. HF clinic appointment made for 12/7 and [facility] arranged for same. Home visit completed. Seanpatrick agreed to visit in one week.    Future Appointments  Date Time  Provider [place]  11/28/2019  1:30 PM [facility] PA/NP [facility] None  02/13/2020 11:10 AM Charlott Rakes, MD [facility] None     ACTION: Home visit completed

## 2019-11-21 NOTE — Telephone Encounter (Signed)
Medication was D/C's in June after discharge. Please refill or advise.

## 2019-11-23 ENCOUNTER — Telehealth: Payer: Self-pay
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
## 2019-11-23 NOTE — Telephone Encounter (Addendum)
Message received from patient requesting a call back from this CM.   Call returned to patient and message left with call back number.  Call placed to [facility] program # [phone]. He explained that he received the referral and tried to contact patient. He left a message 11/21/2019 and has not heard back. He will continue to try to reach the patient.   This CM explained patient's medical and behavioral health needs as well support that he receives from community paramedicine program.  The patient's housing situation has been very unstable for years, making it difficult to manage his medical and behavioral health needs. He had to leave the address he was at when the referral was placed last week and this CM will need to obtain his new address. Marya Amsler explained that  [facility] is a diversion program.  They assist with developing a treatment program for patient and have services to support patient in appropriate domicilliary residence - family care home v. Independent living.  He will request an FL2 when needed. They can help connect the patient with a behavioral health provider.   [name], EMT to arrange transportation with [facility] for patient's cardiology appt 11/28/2019.

## 2019-11-24 ENCOUNTER — Telehealth: Payer: Self-pay

## 2019-11-24 NOTE — Telephone Encounter (Signed)
Call placed to patient and provided him with the phone number for [facility] # [phone]. Provided an overview of the [facility] program and encouraged him to be patient with the process as housing will not be secured immediately and other options for housing have been exhausted.  There is also no guarantee that he will be accepted into this program.  He plans to remain with his friend , [name], but realizes that he needs to save money for rent/security deposit.  He and [name] may need to move from the current residence as [name] explained that the city may close the apartment down.

## 2019-11-28 ENCOUNTER — Encounter (HOSPITAL_COMMUNITY): Payer: Self-pay

## 2019-11-28 ENCOUNTER — Telehealth: Payer: Self-pay

## 2019-11-28 ENCOUNTER — Other Ambulatory Visit (HOSPITAL_COMMUNITY): Payer: Self-pay

## 2019-11-28 ENCOUNTER — Other Ambulatory Visit: Payer: Self-pay

## 2019-11-28 ENCOUNTER — Telehealth (HOSPITAL_COMMUNITY): Payer: Self-pay

## 2019-11-28 ENCOUNTER — Ambulatory Visit (HOSPITAL_COMMUNITY)
Admission: RE | Admit: 2019-11-28 | Discharge: 2019-11-28 | Disposition: A | Discharge status: Home / Self Care | Payer: Medicaid Other | Source: Ambulatory Visit | Attending: Cardiology | Admitting: Cardiology

## 2019-11-28 VITALS — BP 138/92 | HR 91 | Wt 163.4 lb

## 2019-11-28 DIAGNOSIS — Z79899 Other long term (current) drug therapy: Secondary | ICD-10-CM | POA: Insufficient documentation

## 2019-11-28 DIAGNOSIS — Z888 Allergy status to other drugs, medicaments and biological substances status: Secondary | ICD-10-CM | POA: Insufficient documentation

## 2019-11-28 DIAGNOSIS — Z7901 Long term (current) use of anticoagulants: Secondary | ICD-10-CM | POA: Insufficient documentation

## 2019-11-28 DIAGNOSIS — I5023 Acute on chronic systolic (congestive) heart failure: Secondary | ICD-10-CM | POA: Diagnosis not present

## 2019-11-28 DIAGNOSIS — I4819 Other persistent atrial fibrillation: Secondary | ICD-10-CM | POA: Diagnosis not present

## 2019-11-28 DIAGNOSIS — I428 Other cardiomyopathies: Secondary | ICD-10-CM | POA: Diagnosis not present

## 2019-11-28 DIAGNOSIS — F1010 Alcohol abuse, uncomplicated: Secondary | ICD-10-CM | POA: Insufficient documentation

## 2019-11-28 DIAGNOSIS — N179 Acute kidney failure, unspecified: Secondary | ICD-10-CM | POA: Diagnosis not present

## 2019-11-28 DIAGNOSIS — B1920 Unspecified viral hepatitis C without hepatic coma: Secondary | ICD-10-CM | POA: Insufficient documentation

## 2019-11-28 DIAGNOSIS — I5022 Chronic systolic (congestive) heart failure: Secondary | ICD-10-CM | POA: Diagnosis not present

## 2019-11-28 DIAGNOSIS — Z794 Long term (current) use of insulin: Secondary | ICD-10-CM | POA: Diagnosis not present

## 2019-11-28 DIAGNOSIS — I130 Hypertensive heart and chronic kidney disease with heart failure and stage 1 through stage 4 chronic kidney disease, or unspecified chronic kidney disease: Secondary | ICD-10-CM | POA: Diagnosis not present

## 2019-11-28 DIAGNOSIS — E1122 Type 2 diabetes mellitus with diabetic chronic kidney disease: Secondary | ICD-10-CM | POA: Diagnosis not present

## 2019-11-28 DIAGNOSIS — N183 Chronic kidney disease, stage 3 unspecified: Secondary | ICD-10-CM | POA: Diagnosis not present

## 2019-11-28 DIAGNOSIS — F17210 Nicotine dependence, cigarettes, uncomplicated: Secondary | ICD-10-CM | POA: Diagnosis not present

## 2019-11-28 DIAGNOSIS — F329 Major depressive disorder, single episode, unspecified: Secondary | ICD-10-CM | POA: Diagnosis not present

## 2019-11-28 DIAGNOSIS — F1410 Cocaine abuse, uncomplicated: Secondary | ICD-10-CM | POA: Diagnosis not present

## 2019-11-28 DIAGNOSIS — E1140 Type 2 diabetes mellitus with diabetic neuropathy, unspecified: Secondary | ICD-10-CM | POA: Diagnosis not present

## 2019-11-28 DIAGNOSIS — I484 Atypical atrial flutter: Secondary | ICD-10-CM | POA: Diagnosis not present

## 2019-11-28 DIAGNOSIS — Z803 Family history of malignant neoplasm of breast: Secondary | ICD-10-CM | POA: Insufficient documentation

## 2019-11-28 DIAGNOSIS — K7460 Unspecified cirrhosis of liver: Secondary | ICD-10-CM | POA: Insufficient documentation

## 2019-11-28 DIAGNOSIS — Z8041 Family history of malignant neoplasm of ovary: Secondary | ICD-10-CM | POA: Insufficient documentation

## 2019-11-28 DIAGNOSIS — M1990 Unspecified osteoarthritis, unspecified site: Secondary | ICD-10-CM | POA: Diagnosis not present

## 2019-11-28 LAB — BASIC METABOLIC PANEL
Anion gap: 13 (ref 5–15)
BUN: 21 mg/dL — ABNORMAL HIGH (ref 6–20)
CO2: 21 mmol/L — ABNORMAL LOW (ref 22–32)
Calcium: 8.8 mg/dL — ABNORMAL LOW (ref 8.9–10.3)
Chloride: 104 mmol/L (ref 98–111)
Creatinine, Ser: 1.58 mg/dL — ABNORMAL HIGH (ref 0.61–1.24)
GFR calc Af Amer: 55 mL/min — ABNORMAL LOW (ref >60)
GFR calc non Af Amer: 47 mL/min — ABNORMAL LOW (ref >60)
Glucose, Bld: 141 mg/dL — ABNORMAL HIGH (ref 70–99)
Potassium: 4.1 mmol/L (ref 3.5–5.1)
Sodium: 138 mmol/L (ref 135–145)

## 2019-11-28 LAB — CBC
HCT: 48.4 % (ref 39.0–52.0)
Hemoglobin: 16.5 g/dL (ref 13.0–17.0)
MCH: 30.6 pg (ref 26.0–34.0)
MCHC: 34.1 g/dL (ref 30.0–36.0)
MCV: 89.6 fL (ref 80.0–100.0)
Platelets: 221 10*3/uL (ref 150–400)
RBC: 5.4 MIL/uL (ref 4.22–5.81)
RDW: 14.6 % (ref 11.5–15.5)
WBC: 5.3 10*3/uL (ref 4.0–10.5)
nRBC: 0 % (ref 0.0–0.2)

## 2019-11-28 LAB — DIGOXIN LEVEL: Digoxin Level: 0.6 ng/mL — ABNORMAL LOW (ref 0.8–2.0)

## 2019-11-28 NOTE — Progress Notes (Signed)
ReDS Vest / Clip - 11/28/19 1300      ReDS Vest / Clip   Station Marker  C    Ruler Value  29    ReDS Value Range  Low volume    ReDS Actual Value  34    Anatomical Comments  sitting
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
## 2019-11-28 NOTE — Telephone Encounter (Signed)
Call received from patient.  He said that he spoke to [employer] and Marya Amsler will plan to call him again this week.  Marya Amsler encouraged him to follow up with [facility]. Patient said that he can contact [facility] to schedule an appt.   He also confirmed that he is going to cardiology appt this afternoon. [facility] is scheduled to transport him and [name], EMT will meet patient at the cardiology appt.

## 2019-11-28 NOTE — Progress Notes (Signed)
Paramedicine Encounter    Patient ID: Brad [name], male    DOB: 02-07-1961, 58 y.o.   MRN: [id number]   Patient Care Team: Charlott Rakes, MD as PCP - General (Family Medicine) Debara Pickett Nadean Corwin, MD as PCP - Cardiology (Cardiology) Charlott Rakes, MD (Family Medicine)  Patient Active Problem List   Diagnosis Date Noted  . Acute on chronic systolic heart failure ([place]) 10/31/2019  . Acute systolic CHF (congestive heart failure) ([place]) 08/02/2019  . Diarrhea 07/29/2019  . Gastroenteritis 07/22/2019  . Hypomagnesemia 06/19/2019  . Chest pain 06/07/2019  . Elevated troponin 05/23/2019  . Tobacco abuse 05/23/2019  . Alcohol abuse 02/21/2019  . Frequent falls 01/17/2019  . Severe mitral regurgitation   . Fall 11/01/2018  . Hyponatremia 10/31/2018  . Chronic atrial fibrillation   . Chronic hyponatremia 08/16/2018  . NSVT (nonsustained ventricular tachycardia) ([place])   . Concussion with loss of consciousness   . Scalp laceration   . Trauma   . Permanent atrial fibrillation   . Hypertensive heart disease   . Shortness of breath   . Pyogenic inflammation of bone ([place])   . Cirrhosis ([place]) 03/23/2018  . Right ankle pain 03/23/2018  . Syncope 08/07/2017  . Acute on chronic combined systolic and diastolic CHF (congestive heart failure) ([place]) 07/09/2017  . Atrial flutter ([place]) 07/09/2017  . Pre-syncope 07/08/2017  . Neuropathy 05/08/2017  . Substance induced mood disorder ([place]) 10/06/2016  . CVA (cerebral vascular accident) ([place]) 09/18/2016  . Left sided numbness   . Homelessness 08/21/2016  . S/P ORIF (open reduction internal fixation) fracture 08/01/2016  . CAD (coronary artery disease), native coronary artery 07/30/2016  . Surgery, elective   . Insomnia 07/22/2016  . NSTEMI (non-ST elevated myocardial infarction) ([place])   . Anemia 07/05/2016  . Thrombocytopenia ([place]) 07/05/2016  . Cocaine abuse ([place]) 07/02/2016  . Chest pain on breathing 07/01/2016  . Essential  hypertension 07/01/2016  . DM type 2 (diabetes mellitus, type 2) ([place]) 07/01/2016  . Hypokalemia 07/01/2016  . CKD (chronic kidney disease) stage 3, GFR 30-59 ml/min 07/01/2016  . Painful diabetic neuropathy ([place]) 07/01/2016  . Polysubstance abuse ([place]) 05/27/2016  . Chronic hepatitis C with cirrhosis ([place]) 05/27/2016  . Chronic diastolic congestive heart failure ([place]) 01/31/2016  . Depression 04/21/2012  . GERD (gastroesophageal reflux disease) 02/16/2012  . History of drug abuse ([place])   . Heroin addiction ([place]) 01/29/2012    Current Outpatient Medications:  .  albuterol (VENTOLIN HFA) 108 (90 Base) MCG/ACT inhaler, Inhale 2 puffs into the lungs every 6 (six) hours as needed for shortness of breath. , Disp: , Rfl:  .  amiodarone (PACERONE) 200 MG tablet, Take 1 tablet (200 mg total) by mouth 2 (two) times daily for 7 days, THEN 1 tablet (200 mg total) daily., Disp: 31 tablet, Rfl: 11 .  apixaban (ELIQUIS) 5 MG TABS tablet, Take 1 tablet (5 mg total) by mouth 2 (two) times daily., Disp: 60 tablet, Rfl: 6 .  digoxin (LANOXIN) 0.125 MG tablet, Take 1 tablet (0.125 mg total) by mouth daily., Disp: 30 tablet, Rfl: 11 .  DULoxetine (CYMBALTA) 60 MG capsule, Take 1 capsule (60 mg total) by mouth daily., Disp: 30 capsule, Rfl: 6 .  gabapentin (NEURONTIN) 300 MG capsule, Take 2 capsules (600 mg total) by mouth 2 (two) times daily., Disp: 120 capsule, Rfl: 3 .  Insulin Glargine (LANTUS SOLOSTAR) 100 UNIT/ML Solostar Pen, Inject 3 Units into the skin daily., Disp: 3 pen, Rfl: 3 .  isosorbide-hydrALAZINE (BIDIL)  20-37.5 MG tablet, Take 1 tablet by mouth 3 (three) times daily., Disp: 90 tablet, Rfl: 11 .  potassium chloride SA (KLOR-CON) 20 MEQ tablet, Take 1 tablet (20 mEq total) by mouth daily., Disp: 45 tablet, Rfl: 6 .  spironolactone (ALDACTONE) 25 MG tablet, Take 1 tablet (25 mg total) by mouth daily., Disp: 30 tablet, Rfl: 11 .  torsemide (DEMADEX) 20 MG tablet, Take 4 tabs (80 mg) in the morning  and 2 tabs (40 mg) in the afternoon/evening, Disp: 240 tablet, Rfl: 11 Allergies  Allergen Reactions  . Angiotensin Receptor Blockers Anaphylaxis and Other (See Comments)    (Angioedema also with Lisinopril, therefore ARB's are contraindicated)  . Lisinopril Anaphylaxis and Other (See Comments)    Throat swells  . Pamelor [Nortriptyline Hcl] Anaphylaxis and Swelling    Throat swells     Social History   Socioeconomic History  . Marital status: Married    Spouse name: Not on file  . Number of children: 3  . Years of education: 2y college  . Highest education level: Not on file  Occupational History  . 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 Financial resource strain: Not on file  . Food insecurity    Worry: Not on file    Inability: Not on file  . Transportation needs    Medical: Not on file    Non-medical: Not on file  Tobacco Use  . Smoking status: Current Every Day Smoker    Packs/day: 0.50    Years: 28.00    Pack years: 14.00    Types: Cigarettes  . Smokeless tobacco: Never Used  Substance and Sexual Activity  . Alcohol use: Yes    Alcohol/week: 1.0 standard drinks    Types: 1 Cans of beer per week    Comment: "I drink ~ 40oz beer/day"  . Drug use: Yes    Types: IV, Cocaine, Heroin    Comment: 08/17/2018 "no heroin since 2013; I use cocaine ~ once/month, last use 03/21/2019  . Sexual activity: Not Currently  Lifestyle  . Physical activity    Days per week: Not on file    Minutes per session: Not on file  . Stress: Not on file  Relationships  . Social Herbalist on phone: Not on file    Gets together: Not on file    Attends religious service: Not on file    Active member of club or organization: Not on file    Attends meetings of clubs or organizations: Not on file    Relationship status: Not on file  . Intimate partner violence    Fear of current or ex partner: Not on file    Emotionally abused: Not on  file    Physically abused: Not on file    Forced sexual activity: Not on file  Other Topics Concern  . Not on file  Social History Narrative   ** Merged History Encounter **       Incarcerated from 2006-2010, then 10/2011-12/2011.  Has been trying to get sober (no heroin, alcohol since 10/2011).     Physical Exam Vitals signs reviewed.  Constitutional:      Appearance: He is normal weight.  HENT:     Head: Normocephalic.     Nose: Nose normal.     Mouth/Throat:     Mouth: Mucous membranes are moist.  Eyes:     Pupils: Pupils are equal, round,  and reactive to light.  Neck:     Musculoskeletal: Normal range of motion.  Cardiovascular:     Rate and Rhythm: Normal rate. Rhythm irregular.     Pulses: Normal pulses.  Pulmonary:     Effort: Pulmonary effort is normal.     Breath sounds: Normal breath sounds.  Abdominal:     General: Abdomen is flat.     Palpations: Abdomen is soft.  Musculoskeletal: Normal range of motion.     Right lower leg: No edema.     Left lower leg: No edema.  Skin:    General: Skin is warm and dry.     Capillary Refill: Capillary refill takes less than 2 seconds.  Neurological:     Mental Status: He is alert. Mental status is at baseline.  Psychiatric:        Mood and Affect: Mood normal.    Arrived for clinic visit for Mr. Scarfo. He was alert and oriented with no medical complaints. Mr. Beebe brought medications and pill box. During visit, vitals obtained and EKG obtained by staff. PA Simmons evaluated patient and medications reviewed. PA reports patient's EKG showed aflutter with a controlled rate. PA requests patient's medications stay same until lab work comes back.  Educated patient on importance of taking all prescribed medications. Will follow up with patient on Monday at home. Visit complete.     Meds Ordered: Digoxin Spironalactone        Future Appointments  Date Time Provider [place]  02/13/2020 11:10 AM Charlott Rakes, MD [facility] None     ACTION: Home visit completed Next visit planned for one week

## 2019-11-28 NOTE — Telephone Encounter (Signed)
Spoke to [place] to assist Brad [name] with intake appointment. [facility] reports they will be reaching out to Brad [name] for same.

## 2019-11-28 NOTE — Patient Instructions (Signed)
Labs were done today. We will only call you if there are any Abnormalities. No Call Is A Good Call!!   Medication:  Bidil - Please increase this medication to 3 times a day.  Your Provider would like to see you back in 3-4 weeks.   At the [facility], you and your health needs are our priority. As part of our continuing mission to provide you with exceptional heart care, we have created designated Provider Care Teams. These Care Teams include your primary Cardiologist (physician) and Advanced Practice Providers (APPs- Physician Assistants and Nurse Practitioners) who all work together to provide you with the care you need, when you need it.   You may see any of the following providers on your designated Care Team at your next follow up: Marland Kitchen Dr Glori Bickers . Dr Loralie Champagne . Darrick Grinder, NP . Lyda Jester, PA . Audry Riles, PharmD   Please be sure to bring in all your medications bottles to every appointment.

## 2019-11-29 ENCOUNTER — Telehealth: Payer: Self-pay

## 2019-11-29 NOTE — Telephone Encounter (Signed)
Call received form patient. He explained that he went to [facility] today for a behavioral health assessment. He said that the he was told he has moderate depression, anxiety, and " a lot of issues"  They recommend therapy for him and possibly medication and will contact him to schedule an appointment. He said that he felt good about the appointment and plans to follow up as they suggest.

## 2019-11-30 ENCOUNTER — Telehealth: Payer: Self-pay

## 2019-11-30 NOTE — Telephone Encounter (Signed)
Call received from [place].  He explained that he spoke to the patient at length today.  They discussed the program and need for assessment from behavioral health clinician confirming that the patient has severe persistent mental illness (SPMI) .  He requested that the patient help him obtain documentation from his outpatient therapist.  The plan is currently for the patient to follow up with [facility].  Mr Zigmund Daniel said that he provided the patient with his email address as well as his phone numbers to have [facility] send him the requested documentation. The patient will need to contact [facility] for this information. Mr Zigmund Daniel explained that he has 30 days to complete this referral process, if it is not completed in 30 days, a new referral will need to be submitted.   Call placed to patient and explained above. He said that Beverly Sessions will be contacting him to schedule an appt.  This CM encouraged him to call Beverly Sessions if they have not contacted him by the end of the week. This CM reminded the patient that there is no guarantee that he will be accepted into the program and he said that he understood. He also confirmed that he has the contact information for Mr Zigmund Daniel.

## 2019-12-05 ENCOUNTER — Other Ambulatory Visit: Payer: Self-pay

## 2019-12-05 NOTE — Progress Notes (Signed)
Paramedicine Encounter    Patient ID: Brad [name], male    DOB: 02-07-1961, 58 y.o.   MRN: [id number]   Patient Care Team: Charlott Rakes, MD as PCP - General (Family Medicine) Debara Pickett Nadean Corwin, MD as PCP - Cardiology (Cardiology) Charlott Rakes, MD (Family Medicine)  Patient Active Problem List   Diagnosis Date Noted  . Acute on chronic systolic heart failure ([place]) 10/31/2019  . Acute systolic CHF (congestive heart failure) ([place]) 08/02/2019  . Diarrhea 07/29/2019  . Gastroenteritis 07/22/2019  . Hypomagnesemia 06/19/2019  . Chest pain 06/07/2019  . Elevated troponin 05/23/2019  . Tobacco abuse 05/23/2019  . Alcohol abuse 02/21/2019  . Frequent falls 01/17/2019  . Severe mitral regurgitation   . Fall 11/01/2018  . Hyponatremia 10/31/2018  . Chronic atrial fibrillation   . Chronic hyponatremia 08/16/2018  . NSVT (nonsustained ventricular tachycardia) ([place])   . Concussion with loss of consciousness   . Scalp laceration   . Trauma   . Permanent atrial fibrillation   . Hypertensive heart disease   . Shortness of breath   . Pyogenic inflammation of bone ([place])   . Cirrhosis ([place]) 03/23/2018  . Right ankle pain 03/23/2018  . Syncope 08/07/2017  . Acute on chronic combined systolic and diastolic CHF (congestive heart failure) ([place]) 07/09/2017  . Atrial flutter ([place]) 07/09/2017  . Pre-syncope 07/08/2017  . Neuropathy 05/08/2017  . Substance induced mood disorder ([place]) 10/06/2016  . CVA (cerebral vascular accident) ([place]) 09/18/2016  . Left sided numbness   . Homelessness 08/21/2016  . S/P ORIF (open reduction internal fixation) fracture 08/01/2016  . CAD (coronary artery disease), native coronary artery 07/30/2016  . Surgery, elective   . Insomnia 07/22/2016  . NSTEMI (non-ST elevated myocardial infarction) ([place])   . Anemia 07/05/2016  . Thrombocytopenia ([place]) 07/05/2016  . Cocaine abuse ([place]) 07/02/2016  . Chest pain on breathing 07/01/2016  . Essential  hypertension 07/01/2016  . DM type 2 (diabetes mellitus, type 2) ([place]) 07/01/2016  . Hypokalemia 07/01/2016  . CKD (chronic kidney disease) stage 3, GFR 30-59 ml/min 07/01/2016  . Painful diabetic neuropathy ([place]) 07/01/2016  . Polysubstance abuse ([place]) 05/27/2016  . Chronic hepatitis C with cirrhosis ([place]) 05/27/2016  . Chronic diastolic congestive heart failure ([place]) 01/31/2016  . Depression 04/21/2012  . GERD (gastroesophageal reflux disease) 02/16/2012  . History of drug abuse ([place])   . Heroin addiction ([place]) 01/29/2012    Current Outpatient Medications:  .  albuterol (VENTOLIN HFA) 108 (90 Base) MCG/ACT inhaler, Inhale 2 puffs into the lungs every 6 (six) hours as needed for shortness of breath. , Disp: , Rfl:  .  amiodarone (PACERONE) 200 MG tablet, Take 1 tablet (200 mg total) by mouth 2 (two) times daily for 7 days, THEN 1 tablet (200 mg total) daily., Disp: 31 tablet, Rfl: 11 .  apixaban (ELIQUIS) 5 MG TABS tablet, Take 1 tablet (5 mg total) by mouth 2 (two) times daily., Disp: 60 tablet, Rfl: 6 .  digoxin (LANOXIN) 0.125 MG tablet, Take 1 tablet (0.125 mg total) by mouth daily., Disp: 30 tablet, Rfl: 11 .  DULoxetine (CYMBALTA) 60 MG capsule, Take 1 capsule (60 mg total) by mouth daily., Disp: 30 capsule, Rfl: 6 .  gabapentin (NEURONTIN) 300 MG capsule, Take 2 capsules (600 mg total) by mouth 2 (two) times daily., Disp: 120 capsule, Rfl: 3 .  Insulin Glargine (LANTUS SOLOSTAR) 100 UNIT/ML Solostar Pen, Inject 3 Units into the skin daily., Disp: 3 pen, Rfl: 3 .  isosorbide-hydrALAZINE (BIDIL)  20-37.5 MG tablet, Take 1 tablet by mouth 3 (three) times daily., Disp: 90 tablet, Rfl: 11 .  potassium chloride SA (KLOR-CON) 20 MEQ tablet, Take 1 tablet (20 mEq total) by mouth daily., Disp: 45 tablet, Rfl: 6 .  spironolactone (ALDACTONE) 25 MG tablet, Take 1 tablet (25 mg total) by mouth daily., Disp: 30 tablet, Rfl: 11 .  torsemide (DEMADEX) 20 MG tablet, Take 4 tabs (80 mg) in the morning  and 2 tabs (40 mg) in the afternoon/evening, Disp: 240 tablet, Rfl: 11 Allergies  Allergen Reactions  . Angiotensin Receptor Blockers Anaphylaxis and Other (See Comments)    (Angioedema also with Lisinopril, therefore ARB's are contraindicated)  . Lisinopril Anaphylaxis and Other (See Comments)    Throat swells  . Pamelor [Nortriptyline Hcl] Anaphylaxis and Swelling    Throat swells     Social History   Socioeconomic History  . Marital status: Married    Spouse name: Not on file  . Number of children: 3  . Years of education: 2y college  . Highest education level: Not on file  Occupational History  . 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 Smoking status: Current Every Day Smoker    Packs/day: 0.50    Years: 28.00    Pack years: 14.00    Types: Cigarettes  . Smokeless tobacco: Never Used  Substance and Sexual Activity  . Alcohol use: Yes    Alcohol/week: 1.0 standard drinks    Types: 1 Cans of beer per week    Comment: "I drink ~ 40oz beer/day"  . Drug use: Yes    Types: IV, Cocaine, Heroin    Comment: 08/17/2018 "no heroin since 2013; I use cocaine ~ once/month, last use 03/21/2019  . Sexual activity: Not Currently  Other Topics Concern  . Not on file  Social History Narrative   ** Merged History Encounter **       Incarcerated from 2006-2010, then 10/2011-12/2011.  Has been trying to get sober (no heroin, alcohol since 10/2011).    Social Determinants of Health   Financial Resource Strain:   . Difficulty of Paying Living Expenses: Not on file  Food Insecurity:   . Worried About Charity fundraiser in the Last Year: Not on file  . Ran Out of Food in the Last Year: Not on file  Transportation Needs:   . Lack of Transportation (Medical): Not on file  . Lack of Transportation (Non-Medical): Not on file  Physical Activity:   . Days of Exercise per Week: Not on file  . Minutes of Exercise per Session: Not on file  Stress:    . Feeling of Stress : Not on file  Social Connections:   . Frequency of Communication with Friends and Family: Not on file  . Frequency of Social Gatherings with Friends and Family: Not on file  . Attends Religious Services: Not on file  . Active Member of Clubs or Organizations: Not on file  . Attends Archivist Meetings: Not on file  . Marital Status: Not on file  Intimate Partner Violence:   . Fear of Current or Ex-Partner: Not on file  . Emotionally Abused: Not on file  . Physically Abused: Not on file  . Sexually Abused: Not on file    Physical Exam Constitutional:      Appearance: He is normal weight.  HENT:     Head: Normocephalic.     Nose: Nose  normal.     Mouth/Throat:     Mouth: Mucous membranes are moist.  Eyes:     Pupils: Pupils are equal, round, and reactive to light.  Cardiovascular:     Rate and Rhythm: Normal rate. Rhythm irregular.     Pulses: Normal pulses.     Heart sounds: Normal heart sounds.  Pulmonary:     Effort: Pulmonary effort is normal.     Breath sounds: Normal breath sounds.  Abdominal:     Palpations: Abdomen is soft.  Musculoskeletal:        General: Normal range of motion.     Cervical back: Normal range of motion.     Right lower leg: No edema.     Left lower leg: No edema.  Skin:    General: Skin is warm and dry.     Capillary Refill: Capillary refill takes less than 2 seconds.  Neurological:     Mental Status: He is alert. Mental status is at baseline.  Psychiatric:        Mood and Affect: Mood normal.      Arrived for visit with Mr. Chait who met me at [place] for visit today. Mr. Odriscoll was intoxicated upon visit stating he drank "a few beers." Vitals obtained. Mr. Teems did not bring his medications as instructed. He states he can take them on his own without pill box. Patient has done so in the past, I will place in pill box next week. No complaints from patient at present. He reports he has another  appointment with [facility] tomorrow. I advised patient to follow up with me about same. Patient agreed. Visit complete will see patient in one week.    Future Appointments  Date Time Provider [place]  12/26/2019  8:30 AM [facility] PA/NP [facility] None  02/13/2020 11:10 AM Charlott Rakes, MD [facility] None     ACTION: Home visit completed

## 2019-12-06 ENCOUNTER — Telehealth: Payer: Self-pay

## 2019-12-06 NOTE — Telephone Encounter (Signed)
Call received from patient stating that he had to cancel his appt with [facility] yesterday because he did not have a ride. He said the [facility] will be calling him back to reschedule.   He also said that he spoke to gregory Matthews/[facility] who told him that he is " doing all of the right things."

## 2019-12-12 ENCOUNTER — Other Ambulatory Visit (HOSPITAL_COMMUNITY): Payer: Self-pay

## 2019-12-12 ENCOUNTER — Telehealth (HOSPITAL_COMMUNITY): Payer: Self-pay

## 2019-12-12 ENCOUNTER — Telehealth: Payer: Self-pay

## 2019-12-12 ENCOUNTER — Other Ambulatory Visit (HOSPITAL_COMMUNITY): Payer: Self-pay | Admitting: *Deleted

## 2019-12-12 MED ORDER — ISOSORB DINITRATE-HYDRALAZINE 20-37.5 MG PO TABS: 1.0000 | ORAL_TABLET | Freq: Three times a day (TID) | ORAL | 11 refills | Status: DC

## 2019-12-12 MED ORDER — AMIODARONE HCL 200 MG PO TABS: ORAL_TABLET | ORAL | 11 refills | Status: DC

## 2019-12-12 NOTE — Telephone Encounter (Signed)
Call received from the patient noting that he has an appointment with [facility] tomorrow - 12/13/2019 @ [phone]

## 2019-12-12 NOTE — Progress Notes (Signed)
Paramedicine Encounter    Patient ID: Brad [name], male    DOB: Jun 21, 1961, 58 y.o.   MRN: [id number]   Patient Care Team: Charlott Rakes, MD as PCP - General (Family Medicine) Debara Pickett Nadean Corwin, MD as PCP - Cardiology (Cardiology) Charlott Rakes, MD (Family Medicine)  Patient Active Problem List   Diagnosis Date Noted  . Acute on chronic systolic heart failure ([place]) 10/31/2019  . Acute systolic CHF (congestive heart failure) ([place]) 08/02/2019  . Diarrhea 07/29/2019  . Gastroenteritis 07/22/2019  . Hypomagnesemia 06/19/2019  . Chest pain 06/07/2019  . Elevated troponin 05/23/2019  . Tobacco abuse 05/23/2019  . Alcohol abuse 02/21/2019  . Frequent falls 01/17/2019  . Severe mitral regurgitation   . Fall 11/01/2018  . Hyponatremia 10/31/2018  . Chronic atrial fibrillation   . Chronic hyponatremia 08/16/2018  . NSVT (nonsustained ventricular tachycardia) ([place])   . Concussion with loss of consciousness   . Scalp laceration   . Trauma   . Permanent atrial fibrillation   . Hypertensive heart disease   . Shortness of breath   . Pyogenic inflammation of bone ([place])   . Cirrhosis ([place]) 03/23/2018  . Right ankle pain 03/23/2018  . Syncope 08/07/2017  . Acute on chronic combined systolic and diastolic CHF (congestive heart failure) ([place]) 07/09/2017  . Atrial flutter ([place]) 07/09/2017  . Pre-syncope 07/08/2017  . Neuropathy 05/08/2017  . Substance induced mood disorder ([place]) 10/06/2016  . CVA (cerebral vascular accident) ([place]) 09/18/2016  . Left sided numbness   . Homelessness 08/21/2016  . S/P ORIF (open reduction internal fixation) fracture 08/01/2016  . CAD (coronary artery disease), native coronary artery 07/30/2016  . Surgery, elective   . Insomnia 07/22/2016  . NSTEMI (non-ST elevated myocardial infarction) ([place])   . Anemia 07/05/2016  . Thrombocytopenia ([place]) 07/05/2016  . Cocaine abuse ([place]) 07/02/2016  . Chest pain on breathing 07/01/2016  . Essential  hypertension 07/01/2016  . DM type 2 (diabetes mellitus, type 2) ([place]) 07/01/2016  . Hypokalemia 07/01/2016  . CKD (chronic kidney disease) stage 3, GFR 30-59 ml/min 07/01/2016  . Painful diabetic neuropathy ([place]) 07/01/2016  . Polysubstance abuse ([place]) 05/27/2016  . Chronic hepatitis C with cirrhosis ([place]) 05/27/2016  . Chronic diastolic congestive heart failure ([place]) 01/31/2016  . Depression 04/21/2012  . GERD (gastroesophageal reflux disease) 02/16/2012  . History of drug abuse ([place])   . Heroin addiction ([place]) 01/29/2012    Current Outpatient Medications:  .  amiodarone (PACERONE) 200 MG tablet, Take 1 tablet (200 mg total) by mouth 2 (two) times daily for 7 days, THEN 1 tablet (200 mg total) daily., Disp: 31 tablet, Rfl: 11 .  apixaban (ELIQUIS) 5 MG TABS tablet, Take 1 tablet (5 mg total) by mouth 2 (two) times daily., Disp: 60 tablet, Rfl: 6 .  digoxin (LANOXIN) 0.125 MG tablet, Take 1 tablet (0.125 mg total) by mouth daily., Disp: 30 tablet, Rfl: 11 .  DULoxetine (CYMBALTA) 60 MG capsule, Take 1 capsule (60 mg total) by mouth daily., Disp: 30 capsule, Rfl: 6 .  gabapentin (NEURONTIN) 300 MG capsule, Take 2 capsules (600 mg total) by mouth 2 (two) times daily., Disp: 120 capsule, Rfl: 3 .  Insulin Glargine (LANTUS SOLOSTAR) 100 UNIT/ML Solostar Pen, Inject 3 Units into the skin daily., Disp: 3 pen, Rfl: 3 .  isosorbide-hydrALAZINE (BIDIL) 20-37.5 MG tablet, Take 1 tablet by mouth 3 (three) times daily., Disp: 90 tablet, Rfl: 11 .  potassium chloride SA (KLOR-CON) 20 MEQ tablet, Take 1 tablet (20 mEq  total) by mouth daily., Disp: 45 tablet, Rfl: 6 .  spironolactone (ALDACTONE) 25 MG tablet, Take 1 tablet (25 mg total) by mouth daily., Disp: 30 tablet, Rfl: 11 .  torsemide (DEMADEX) 20 MG tablet, Take 4 tabs (80 mg) in the morning and 2 tabs (40 mg) in the afternoon/evening, Disp: 240 tablet, Rfl: 11 .  albuterol (VENTOLIN HFA) 108 (90 Base) MCG/ACT inhaler, Inhale 2 puffs into the lungs  every 6 (six) hours as needed for shortness of breath. , Disp: , Rfl:  Allergies  Allergen Reactions  . Angiotensin Receptor Blockers Anaphylaxis and Other (See Comments)    (Angioedema also with Lisinopril, therefore ARB's are contraindicated)  . Lisinopril Anaphylaxis and Other (See Comments)    Throat swells  . Pamelor [Nortriptyline Hcl] Anaphylaxis and Swelling    Throat swells     Social History   Socioeconomic History  . Marital status: Married    Spouse name: Not on file  . Number of children: 3  . Years of education: 2y college  . Highest education level: Not on file  Occupational History  . 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 Smoking status: Current Every Day Smoker    Packs/day: 0.50    Years: 28.00    Pack years: 14.00    Types: Cigarettes  . Smokeless tobacco: Never Used  Substance and Sexual Activity  . Alcohol use: Yes    Alcohol/week: 1.0 standard drinks    Types: 1 Cans of beer per week    Comment: "I drink ~ 40oz beer/day"  . Drug use: Yes    Types: IV, Cocaine, Heroin    Comment: 08/17/2018 "no heroin since 2013; I use cocaine ~ once/month, last use 03/21/2019  . Sexual activity: Not Currently  Other Topics Concern  . Not on file  Social History Narrative   ** Merged History Encounter **       Incarcerated from 2006-2010, then 10/2011-12/2011.  Has been trying to get sober (no heroin, alcohol since 10/2011).    Social Determinants of Health   Financial Resource Strain:   . Difficulty of Paying Living Expenses: Not on file  Food Insecurity:   . Worried About Charity fundraiser in the Last Year: Not on file  . Ran Out of Food in the Last Year: Not on file  Transportation Needs:   . Lack of Transportation (Medical): Not on file  . Lack of Transportation (Non-Medical): Not on file  Physical Activity:   . Days of Exercise per Week: Not on file  . Minutes of Exercise per Session: Not on file  Stress:    . Feeling of Stress : Not on file  Social Connections:   . Frequency of Communication with Friends and Family: Not on file  . Frequency of Social Gatherings with Friends and Family: Not on file  . Attends Religious Services: Not on file  . Active Member of Clubs or Organizations: Not on file  . Attends Archivist Meetings: Not on file  . Marital Status: Not on file  Intimate Partner Violence:   . Fear of Current or Ex-Partner: Not on file  . Emotionally Abused: Not on file  . Physically Abused: Not on file  . Sexually Abused: Not on file    Physical Exam Constitutional:      Appearance: He is normal weight.  HENT:     Head: Normocephalic.     Nose: Nose  normal.     Mouth/Throat:     Mouth: Mucous membranes are moist.     Pharynx: Oropharynx is clear.  Eyes:     Pupils: Pupils are equal, round, and reactive to light.  Cardiovascular:     Rate and Rhythm: Normal rate. Rhythm irregular.     Pulses: Normal pulses.  Pulmonary:     Effort: Pulmonary effort is normal.  Abdominal:     Palpations: Abdomen is soft.  Musculoskeletal:        General: Normal range of motion.     Cervical back: Normal range of motion.     Right lower leg: No edema.     Left lower leg: No edema.  Skin:    General: Skin is warm and dry.     Capillary Refill: Capillary refill takes less than 2 seconds.  Neurological:     Mental Status: He is alert. Mental status is at baseline.  Psychiatric:        Mood and Affect: Mood normal.    Arrived for home visit for Brad [name] who was walking outside to the parking lot to meet me. Brad [name] reports feeling fine, he states he noticed this morning he was getting a little short of breath so he got up and took his daily medicines and now he feels "fine". Lung sounds were clear, no edema noted. Vitals were as noted. Brad [name]'s medications were reviewed and confirmed. Pill box was filled accordingly. Brad [name] reports having to need another appointment for  Brad [name], I advised him to contact them to follow up for same. Brad [name] also stated the first of the year him and his friend Brad [name] would be moving to the [employer] in [place]. I advised Brad [name] to make sure to keep me updated on his where abouts. He understood and agreed. Brad [name] agreed to visit next Monday. Visit complete.   CBG- 242 (still not using Insulin)      Future Appointments  Date Time Provider [place]  12/26/2019  8:30 AM [facility] PA/NP [facility] None  02/13/2020 11:10 AM Charlott Rakes, MD [facility] None     ACTION: Home visit completed Next visit planned for Monday

## 2019-12-12 NOTE — Telephone Encounter (Signed)
Received message from Brad [name] to contact him asap, contacted him with no answer. Voicemail left for him to return my call. Will continue following.

## 2019-12-12 NOTE — Telephone Encounter (Signed)
[facility] they will be sending over medications Amiodarone and Bidil to [facility].

## 2019-12-13 ENCOUNTER — Telehealth: Payer: Self-pay

## 2019-12-13 NOTE — Telephone Encounter (Signed)
Call received from patient.  He explained that he completed his appointment at [facility] today and has another one scheduled for next week - 12/20/2019.

## 2019-12-19 ENCOUNTER — Other Ambulatory Visit (HOSPITAL_COMMUNITY): Payer: Self-pay

## 2019-12-19 ENCOUNTER — Telehealth (HOSPITAL_COMMUNITY): Payer: Self-pay

## 2019-12-19 NOTE — Telephone Encounter (Signed)
Spoke to [place] who confirmed 19 visit today, meeting at [employer].

## 2019-12-19 NOTE — Progress Notes (Signed)
Paramedicine Encounter    Patient ID: Brad [name], male    DOB: [birth year], 58 y.o.   MRN: [id number]   Patient Care Team: Charlott Rakes, MD as PCP - General (Family Medicine) Debara Pickett Nadean Corwin, MD as PCP - Cardiology (Cardiology) Charlott Rakes, MD (Family Medicine)  Patient Active Problem List   Diagnosis Date Noted  . Acute on chronic systolic heart failure ([place]) 10/31/2019  . Acute systolic CHF (congestive heart failure) ([place]) 08/02/2019  . Diarrhea 07/29/2019  . Gastroenteritis 07/22/2019  . Hypomagnesemia 06/19/2019  . Chest pain 06/07/2019  . Elevated troponin 05/23/2019  . Tobacco abuse 05/23/2019  . Alcohol abuse 02/21/2019  . Frequent falls 01/17/2019  . Severe mitral regurgitation   . Fall 11/01/2018  . Hyponatremia 10/31/2018  . Chronic atrial fibrillation   . Chronic hyponatremia 08/16/2018  . NSVT (nonsustained ventricular tachycardia) ([place])   . Concussion with loss of consciousness   . Scalp laceration   . Trauma   . Permanent atrial fibrillation   . Hypertensive heart disease   . Shortness of breath   . Pyogenic inflammation of bone ([place])   . Cirrhosis ([place]) 03/23/2018  . Right ankle pain 03/23/2018  . Syncope 08/07/2017  . Acute on chronic combined systolic and diastolic CHF (congestive heart failure) ([place]) 07/09/2017  . Atrial flutter ([place]) 07/09/2017  . Pre-syncope 07/08/2017  . Neuropathy 05/08/2017  . Substance induced mood disorder ([place]) 10/06/2016  . CVA (cerebral vascular accident) ([place]) 09/18/2016  . Left sided numbness   . Homelessness 08/21/2016  . S/P ORIF (open reduction internal fixation) fracture 08/01/2016  . CAD (coronary artery disease), native coronary artery 07/30/2016  . Surgery, elective   . Insomnia 07/22/2016  . NSTEMI (non-ST elevated myocardial infarction) ([place])   . Anemia 07/05/2016  . Thrombocytopenia ([place]) 07/05/2016  . Cocaine abuse ([place]) 07/02/2016  . Chest pain on breathing 07/01/2016  . Essential  hypertension 07/01/2016  . DM type 2 (diabetes mellitus, type 2) ([place]) 07/01/2016  . Hypokalemia 07/01/2016  . CKD (chronic kidney disease) stage 3, GFR 30-59 ml/min 07/01/2016  . Painful diabetic neuropathy ([place]) 07/01/2016  . Polysubstance abuse ([place]) 05/27/2016  . Chronic hepatitis C with cirrhosis ([place]) 05/27/2016  . Chronic diastolic congestive heart failure ([place]) 01/31/2016  . Depression 04/21/2012  . GERD (gastroesophageal reflux disease) 02/16/2012  . History of drug abuse ([place])   . Heroin addiction ([place]) 01/29/2012    Current Outpatient Medications:  .  albuterol (VENTOLIN HFA) 108 (90 Base) MCG/ACT inhaler, Inhale 2 puffs into the lungs every 6 (six) hours as needed for shortness of breath. , Disp: , Rfl:  .  amiodarone (PACERONE) 200 MG tablet, Take 1 tablet (200 mg total) by mouth 2 (two) times daily for 7 days, THEN 1 tablet (200 mg total) daily., Disp: 31 tablet, Rfl: 11 .  apixaban (ELIQUIS) 5 MG TABS tablet, Take 1 tablet (5 mg total) by mouth 2 (two) times daily., Disp: 60 tablet, Rfl: 6 .  digoxin (LANOXIN) 0.125 MG tablet, Take 1 tablet (0.125 mg total) by mouth daily., Disp: 30 tablet, Rfl: 11 .  DULoxetine (CYMBALTA) 60 MG capsule, Take 1 capsule (60 mg total) by mouth daily., Disp: 30 capsule, Rfl: 6 .  gabapentin (NEURONTIN) 300 MG capsule, Take 2 capsules (600 mg total) by mouth 2 (two) times daily., Disp: 120 capsule, Rfl: 3 .  isosorbide-hydrALAZINE (BIDIL) 20-37.5 MG tablet, Take 1 tablet by mouth 3 (three) times daily., Disp: 90 tablet, Rfl: 11 .  potassium chloride SA (  KLOR-CON) 20 MEQ tablet, Take 1 tablet (20 mEq total) by mouth daily., Disp: 45 tablet, Rfl: 6 .  spironolactone (ALDACTONE) 25 MG tablet, Take 1 tablet (25 mg total) by mouth daily., Disp: 30 tablet, Rfl: 11 .  torsemide (DEMADEX) 20 MG tablet, Take 4 tabs (80 mg) in the morning and 2 tabs (40 mg) in the afternoon/evening, Disp: 240 tablet, Rfl: 11 .  Insulin Glargine (LANTUS SOLOSTAR) 100 UNIT/ML  Solostar Pen, Inject 3 Units into the skin daily. (Patient not taking: Reported on 12/19/2019), Disp: 3 pen, Rfl: 3 Allergies  Allergen Reactions  . Angiotensin Receptor Blockers Anaphylaxis and Other (See Comments)    (Angioedema also with Lisinopril, therefore ARB's are contraindicated)  . Lisinopril Anaphylaxis and Other (See Comments)    Throat swells  . Pamelor [Nortriptyline Hcl] Anaphylaxis and Swelling    Throat swells     Social History   Socioeconomic History  . Marital status: Married    Spouse name: Not on file  . Number of children: 3  . Years of education: 2y college  . Highest education level: Not on file  Occupational History  . 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 Smoking status: Current Every Day Smoker    Packs/day: 0.50    Years: 28.00    Pack years: 14.00    Types: Cigarettes  . Smokeless tobacco: Never Used  Substance and Sexual Activity  . Alcohol use: Yes    Alcohol/week: 1.0 standard drinks    Types: 1 Cans of beer per week    Comment: "I drink ~ 40oz beer/day"  . Drug use: Yes    Types: IV, Cocaine, Heroin    Comment: 08/17/2018 "no heroin since 2013; I use cocaine ~ once/month, last use 03/21/2019  . Sexual activity: Not Currently  Other Topics Concern  . Not on file  Social History Narrative   ** Merged History Encounter **       Incarcerated from 2006-2010, then 10/2011-12/2011.  Has been trying to get sober (no heroin, alcohol since 10/2011).    Social Determinants of Health   Financial Resource Strain:   . Difficulty of Paying Living Expenses: Not on file  Food Insecurity:   . Worried About Charity fundraiser in the Last Year: Not on file  . Ran Out of Food in the Last Year: Not on file  Transportation Needs:   . Lack of Transportation (Medical): Not on file  . Lack of Transportation (Non-Medical): Not on file  Physical Activity:   . Days of Exercise per Week: Not on file  . Minutes of  Exercise per Session: Not on file  Stress:   . Feeling of Stress : Not on file  Social Connections:   . Frequency of Communication with Friends and Family: Not on file  . Frequency of Social Gatherings with Friends and Family: Not on file  . Attends Religious Services: Not on file  . Active Member of Clubs or Organizations: Not on file  . Attends Archivist Meetings: Not on file  . Marital Status: Not on file  Intimate Partner Violence:   . Fear of Current or Ex-Partner: Not on file  . Emotionally Abused: Not on file  . Physically Abused: Not on file  . Sexually Abused: Not on file    Physical Exam Vitals reviewed.  Constitutional:      Appearance: He is normal weight.  HENT:  Head: Normocephalic.     Nose: Nose normal.     Mouth/Throat:     Mouth: Mucous membranes are moist.     Pharynx: Oropharynx is clear.  Eyes:     Pupils: Pupils are equal, round, and reactive to light.  Cardiovascular:     Rate and Rhythm: Normal rate. Rhythm irregular.     Pulses: Normal pulses.  Pulmonary:     Effort: Pulmonary effort is normal.  Abdominal:     General: Abdomen is flat.     Palpations: Abdomen is soft.  Musculoskeletal:        General: Normal range of motion.     Cervical back: Normal range of motion.     Right lower leg: No edema.     Left lower leg: No edema.  Skin:    General: Skin is warm and dry.     Capillary Refill: Capillary refill takes less than 2 seconds.  Neurological:     Mental Status: He is alert. Mental status is at baseline.  Psychiatric:        Mood and Affect: Mood normal.     Arrived for home visit for Mr. Locklin who met me at [place] for visit today. Mr. Cichy was alert and oriented with no complaints stating he was feeling good. Medications reviewed and confirmed. Pill box completed. Vitals were obtained and are as noted. Mr. Carmen was made aware of [facility] transportation for Monday's heart failure visit. I confirmed I will be  attending heart failure appointment on Monday at 830am. Visit complete.     Future Appointments  Date Time Provider [employer]  12/26/2019  8:30 AM [facility] PA/NP [facility] None  02/13/2020 11:10 AM Charlott Rakes, MD [facility] None     ACTION: Home visit completed Next visit planned for 12/26/19

## 2019-12-19 NOTE — Telephone Encounter (Signed)
[facility] reservation made for Brad [name] for 12/26/2019 for [facility] Failure [facility].  Pickup at [employer] at [phone] Return to [employer] at [employer]  Jamaar made aware of same.

## 2019-12-20 ENCOUNTER — Telehealth: Payer: Self-pay

## 2019-12-20 NOTE — Telephone Encounter (Addendum)
Call received from [facility] stating that he had to close the patient's file because the documentation to support the SPMI (serious persistent mental illness) has not been received within 30 Days. He said that he as tried to contact the patient to inform him but has not been successful.  He also said that a new referral can be placed if warranted and the supporting documentation is received.  Call placed to patient and informed him of above. He said that he did not go to [place] today.  He is waiting for a call back to reschedule.  He said that he and his roommate, [name], are planning to stay at their current apartment for now and not moving into a motel.  The patient said that he has been spraying for bed bugs and that issue has been resolved. He also said that the landlord has repaired the toilet and sink.

## 2019-12-21 ENCOUNTER — Telehealth: Payer: Self-pay

## 2019-12-21 NOTE — Telephone Encounter (Signed)
Call received from patient. He said that he has an another appointment at [facility] tomorrow - 12/22/2019 @ 1400.  He said that he also spoke to Langley Adie / [facility] who explained to him that his referral for housing needed to be closed because it was over 30 days and they had not received the information they needed. He  can be re-referred. This CM explained to him that he will need to be diagnosed with a serious persistent mental illness in order to qualify for the program.  He spoke about being depressed, not hearing from his children, frustrations with housing and lack of money and lack of stability in his life.  He said that his current roommate is working and has been very supportive. The patient said that the other day he didn't feel like living any longer;  but he is not feeling that way now, he denied any SI. He wants to go to [place] and understands the need for ongoing behavioral health support as he is trying to do better for himself.  He noted that he is very resilient and strong and wants to live.

## 2019-12-21 NOTE — Telephone Encounter (Signed)
Thanks for the update

## 2019-12-22 ENCOUNTER — Telehealth: Payer: Self-pay

## 2019-12-22 NOTE — Telephone Encounter (Signed)
Call received from the patient stating that he his feeling okay. He also noted that [facility] called him and rescheduled his appointment from today to Monday, 12/26/2019 @ 1530.   Discussed the importance of saving money for housing rent and security deposit. He said that his roommate, [name], will help him manage his money.  Strongly encouraged him to consider going to DSS to establish an official payee and he can designate [name] as that payee

## 2019-12-26 ENCOUNTER — Other Ambulatory Visit: Payer: Self-pay

## 2019-12-26 ENCOUNTER — Ambulatory Visit (HOSPITAL_COMMUNITY)
Admission: RE | Admit: 2019-12-26 | Discharge: 2019-12-26 | Disposition: A | Discharge status: Home / Self Care | Payer: Medicaid Other | Source: Ambulatory Visit | Attending: Adult Health | Admitting: Adult Health

## 2019-12-26 ENCOUNTER — Encounter (HOSPITAL_COMMUNITY): Payer: Self-pay

## 2019-12-26 ENCOUNTER — Other Ambulatory Visit (HOSPITAL_COMMUNITY): Payer: Self-pay

## 2019-12-26 VITALS — BP 141/88 | HR 84 | Wt 177.4 lb

## 2019-12-26 DIAGNOSIS — F17210 Nicotine dependence, cigarettes, uncomplicated: Secondary | ICD-10-CM | POA: Insufficient documentation

## 2019-12-26 DIAGNOSIS — Z888 Allergy status to other drugs, medicaments and biological substances status: Secondary | ICD-10-CM | POA: Insufficient documentation

## 2019-12-26 DIAGNOSIS — N183 Chronic kidney disease, stage 3 unspecified: Secondary | ICD-10-CM | POA: Diagnosis not present

## 2019-12-26 DIAGNOSIS — E7800 Pure hypercholesterolemia, unspecified: Secondary | ICD-10-CM | POA: Insufficient documentation

## 2019-12-26 DIAGNOSIS — Z794 Long term (current) use of insulin: Secondary | ICD-10-CM | POA: Insufficient documentation

## 2019-12-26 DIAGNOSIS — F329 Major depressive disorder, single episode, unspecified: Secondary | ICD-10-CM | POA: Insufficient documentation

## 2019-12-26 DIAGNOSIS — I5043 Acute on chronic combined systolic (congestive) and diastolic (congestive) heart failure: Secondary | ICD-10-CM | POA: Diagnosis not present

## 2019-12-26 DIAGNOSIS — E1140 Type 2 diabetes mellitus with diabetic neuropathy, unspecified: Secondary | ICD-10-CM | POA: Insufficient documentation

## 2019-12-26 DIAGNOSIS — I130 Hypertensive heart and chronic kidney disease with heart failure and stage 1 through stage 4 chronic kidney disease, or unspecified chronic kidney disease: Secondary | ICD-10-CM | POA: Insufficient documentation

## 2019-12-26 DIAGNOSIS — M1990 Unspecified osteoarthritis, unspecified site: Secondary | ICD-10-CM | POA: Diagnosis not present

## 2019-12-26 DIAGNOSIS — F1410 Cocaine abuse, uncomplicated: Secondary | ICD-10-CM | POA: Diagnosis not present

## 2019-12-26 DIAGNOSIS — Z7901 Long term (current) use of anticoagulants: Secondary | ICD-10-CM | POA: Diagnosis not present

## 2019-12-26 DIAGNOSIS — F1010 Alcohol abuse, uncomplicated: Secondary | ICD-10-CM | POA: Diagnosis not present

## 2019-12-26 DIAGNOSIS — I5023 Acute on chronic systolic (congestive) heart failure: Secondary | ICD-10-CM | POA: Insufficient documentation

## 2019-12-26 DIAGNOSIS — I428 Other cardiomyopathies: Secondary | ICD-10-CM | POA: Insufficient documentation

## 2019-12-26 DIAGNOSIS — N179 Acute kidney failure, unspecified: Secondary | ICD-10-CM | POA: Diagnosis not present

## 2019-12-26 DIAGNOSIS — K7460 Unspecified cirrhosis of liver: Secondary | ICD-10-CM | POA: Diagnosis not present

## 2019-12-26 DIAGNOSIS — Z79899 Other long term (current) drug therapy: Secondary | ICD-10-CM | POA: Insufficient documentation

## 2019-12-26 DIAGNOSIS — I484 Atypical atrial flutter: Secondary | ICD-10-CM | POA: Diagnosis not present

## 2019-12-26 DIAGNOSIS — R079 Chest pain, unspecified: Secondary | ICD-10-CM | POA: Diagnosis not present

## 2019-12-26 DIAGNOSIS — E1122 Type 2 diabetes mellitus with diabetic chronic kidney disease: Secondary | ICD-10-CM | POA: Insufficient documentation

## 2019-12-26 DIAGNOSIS — I4819 Other persistent atrial fibrillation: Secondary | ICD-10-CM

## 2019-12-26 DIAGNOSIS — B1920 Unspecified viral hepatitis C without hepatic coma: Secondary | ICD-10-CM | POA: Diagnosis not present

## 2019-12-26 LAB — BASIC METABOLIC PANEL
Anion gap: 11 (ref 5–15)
BUN: 21 mg/dL — ABNORMAL HIGH (ref 6–20)
CO2: 22 mmol/L (ref 22–32)
Calcium: 8.8 mg/dL — ABNORMAL LOW (ref 8.9–10.3)
Chloride: 88 mmol/L — ABNORMAL LOW (ref 98–111)
Creatinine, Ser: 1.35 mg/dL — ABNORMAL HIGH (ref 0.61–1.24)
GFR calc Af Amer: >60 mL/min (ref >60)
GFR calc non Af Amer: 57 mL/min — ABNORMAL LOW (ref >60)
Glucose, Bld: 134 mg/dL — ABNORMAL HIGH (ref 70–99)
Potassium: 4.5 mmol/L (ref 3.5–5.1)
Sodium: 121 mmol/L — ABNORMAL LOW (ref 135–145)

## 2019-12-26 LAB — BRAIN NATRIURETIC PEPTIDE: B Natriuretic Peptide: 1584.1 pg/mL — ABNORMAL HIGH (ref 0.0–100.0)

## 2019-12-26 LAB — DIGOXIN LEVEL: Digoxin Level: 0.6 ng/mL — ABNORMAL LOW (ref 0.8–2.0)

## 2019-12-26 MED ORDER — TORSEMIDE 20 MG PO TABS: ORAL_TABLET | ORAL | 6 refills | Status: DC
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
## 2019-12-26 NOTE — Patient Instructions (Signed)
INCREASE Torsemide to 80 mg (4 tabs) in the AM and 60 mg (3 tabs) in the PM  Labs today We will only contact you if something comes back abnormal or we need to make some changes. Otherwise no news is good news!  Your physician recommends that you schedule a follow-up appointment in: 2 weeks  in the Advanced Practitioners (PA/NP) Clinic    Do the following things EVERYDAY: 1) Weigh yourself in the morning before breakfast. Write it down and keep it in a log. 2) Take your medicines as prescribed 3) Eat low salt foods--Limit salt (sodium) to 2000 mg per day.  4) Stay as active as you can everyday 5) Limit all fluids for the day to less than 2 liters  At the [facility], you and your health needs are our priority. As part of our continuing mission to provide you with exceptional heart care, we have created designated Provider Care Teams. These Care Teams include your primary Cardiologist (physician) and Advanced Practice Providers (APPs- Physician Assistants and Nurse Practitioners) who all work together to provide you with the care you need, when you need it.   You may see any of the following providers on your designated Care Team at your next follow up: Marland Kitchen Dr Glori Bickers . Dr Loralie Champagne . Darrick Grinder, NP . Lyda Jester, PA . Audry Riles, PharmD   Please be sure to bring in all your medications bottles to every appointment.

## 2019-12-26 NOTE — Progress Notes (Signed)
Meds reviewed and confirmed. Changes noted by HF Clinic noted and updated for pill box. Pill box filled and confirmed. Next home visit scheduled for 1/11.

## 2019-12-26 NOTE — Progress Notes (Unsigned)
ReDS Vest / Clip - 12/26/19 0800      ReDS Vest / Clip   Station Marker  C    Ruler Value  29    ReDS Value Range  (!) High volume overload    ReDS Actual Value  47    Anatomical Comments  sitting

## 2019-12-29 ENCOUNTER — Telehealth (HOSPITAL_COMMUNITY): Payer: Self-pay

## 2019-12-29 NOTE — Telephone Encounter (Signed)
Spoke to Brad [name] today who advised he was being "put out" of his apartment due to Brad [name] (his roommate) going to live with his brother and Brad [name] having lack of funds and not being on the lease on the room. Brad [name] was unsure where he was going to go. Resources such as Acupuncturist were given to patient. Patient understood and was made aware I would be following up. Call completed.

## 2019-12-31 ENCOUNTER — Emergency Department (HOSPITAL_COMMUNITY): Payer: Medicaid Other

## 2019-12-31 ENCOUNTER — Inpatient Hospital Stay (HOSPITAL_COMMUNITY)
Admission: EM | Admit: 2019-12-31 | Discharge: 2020-01-04 | DRG: 640 | Disposition: A | Discharge status: Home / Self Care | Payer: Medicaid Other | Attending: Family Medicine | Admitting: Family Medicine

## 2019-12-31 ENCOUNTER — Encounter (HOSPITAL_COMMUNITY): Payer: Self-pay | Admitting: Emergency Medicine

## 2019-12-31 ENCOUNTER — Inpatient Hospital Stay (HOSPITAL_COMMUNITY)
Admission: EM | Admit: 2019-12-31 | Discharge: 2019-12-31 | DRG: 641 | Discharge status: Against Medical Advice | Payer: Medicaid Other | Attending: Internal Medicine | Admitting: Internal Medicine

## 2019-12-31 ENCOUNTER — Other Ambulatory Visit: Payer: Self-pay

## 2019-12-31 DIAGNOSIS — Z20822 Contact with and (suspected) exposure to covid-19: Secondary | ICD-10-CM | POA: Diagnosis present

## 2019-12-31 DIAGNOSIS — E861 Hypovolemia: Secondary | ICD-10-CM | POA: Diagnosis present

## 2019-12-31 DIAGNOSIS — I5022 Chronic systolic (congestive) heart failure: Secondary | ICD-10-CM | POA: Diagnosis not present

## 2019-12-31 DIAGNOSIS — I2510 Atherosclerotic heart disease of native coronary artery without angina pectoris: Secondary | ICD-10-CM | POA: Diagnosis present

## 2019-12-31 DIAGNOSIS — Z8546 Personal history of malignant neoplasm of prostate: Secondary | ICD-10-CM

## 2019-12-31 DIAGNOSIS — F17210 Nicotine dependence, cigarettes, uncomplicated: Secondary | ICD-10-CM | POA: Diagnosis present

## 2019-12-31 DIAGNOSIS — I428 Other cardiomyopathies: Secondary | ICD-10-CM | POA: Diagnosis present

## 2019-12-31 DIAGNOSIS — I340 Nonrheumatic mitral (valve) insufficiency: Secondary | ICD-10-CM | POA: Diagnosis present

## 2019-12-31 DIAGNOSIS — E119 Type 2 diabetes mellitus without complications: Secondary | ICD-10-CM

## 2019-12-31 DIAGNOSIS — E1165 Type 2 diabetes mellitus with hyperglycemia: Secondary | ICD-10-CM | POA: Diagnosis present

## 2019-12-31 DIAGNOSIS — Z888 Allergy status to other drugs, medicaments and biological substances status: Secondary | ICD-10-CM

## 2019-12-31 DIAGNOSIS — I4821 Permanent atrial fibrillation: Secondary | ICD-10-CM | POA: Diagnosis present

## 2019-12-31 DIAGNOSIS — D631 Anemia in chronic kidney disease: Secondary | ICD-10-CM | POA: Diagnosis present

## 2019-12-31 DIAGNOSIS — N183 Chronic kidney disease, stage 3 unspecified: Secondary | ICD-10-CM | POA: Diagnosis present

## 2019-12-31 DIAGNOSIS — I441 Atrioventricular block, second degree: Secondary | ICD-10-CM | POA: Diagnosis present

## 2019-12-31 DIAGNOSIS — Z96652 Presence of left artificial knee joint: Secondary | ICD-10-CM | POA: Diagnosis present

## 2019-12-31 DIAGNOSIS — K7460 Unspecified cirrhosis of liver: Secondary | ICD-10-CM | POA: Diagnosis present

## 2019-12-31 DIAGNOSIS — E1142 Type 2 diabetes mellitus with diabetic polyneuropathy: Secondary | ICD-10-CM | POA: Diagnosis present

## 2019-12-31 DIAGNOSIS — Z5329 Procedure and treatment not carried out because of patient's decision for other reasons: Secondary | ICD-10-CM | POA: Diagnosis not present

## 2019-12-31 DIAGNOSIS — B182 Chronic viral hepatitis C: Secondary | ICD-10-CM | POA: Diagnosis present

## 2019-12-31 DIAGNOSIS — Z794 Long term (current) use of insulin: Secondary | ICD-10-CM

## 2019-12-31 DIAGNOSIS — F329 Major depressive disorder, single episode, unspecified: Secondary | ICD-10-CM | POA: Diagnosis present

## 2019-12-31 DIAGNOSIS — E1122 Type 2 diabetes mellitus with diabetic chronic kidney disease: Secondary | ICD-10-CM | POA: Diagnosis present

## 2019-12-31 DIAGNOSIS — E440 Moderate protein-calorie malnutrition: Secondary | ICD-10-CM | POA: Diagnosis present

## 2019-12-31 DIAGNOSIS — E871 Hypo-osmolality and hyponatremia: Principal | ICD-10-CM | POA: Diagnosis present

## 2019-12-31 DIAGNOSIS — Z8249 Family history of ischemic heart disease and other diseases of the circulatory system: Secondary | ICD-10-CM

## 2019-12-31 DIAGNOSIS — M1990 Unspecified osteoarthritis, unspecified site: Secondary | ICD-10-CM | POA: Diagnosis present

## 2019-12-31 DIAGNOSIS — Z8673 Personal history of transient ischemic attack (TIA), and cerebral infarction without residual deficits: Secondary | ICD-10-CM

## 2019-12-31 DIAGNOSIS — I4892 Unspecified atrial flutter: Secondary | ICD-10-CM | POA: Diagnosis present

## 2019-12-31 DIAGNOSIS — Z7901 Long term (current) use of anticoagulants: Secondary | ICD-10-CM

## 2019-12-31 DIAGNOSIS — Z79899 Other long term (current) drug therapy: Secondary | ICD-10-CM

## 2019-12-31 DIAGNOSIS — I130 Hypertensive heart and chronic kidney disease with heart failure and stage 1 through stage 4 chronic kidney disease, or unspecified chronic kidney disease: Secondary | ICD-10-CM | POA: Diagnosis present

## 2019-12-31 DIAGNOSIS — E860 Dehydration: Secondary | ICD-10-CM | POA: Diagnosis present

## 2019-12-31 DIAGNOSIS — F1010 Alcohol abuse, uncomplicated: Secondary | ICD-10-CM | POA: Diagnosis present

## 2019-12-31 DIAGNOSIS — G4700 Insomnia, unspecified: Secondary | ICD-10-CM | POA: Diagnosis present

## 2019-12-31 DIAGNOSIS — R112 Nausea with vomiting, unspecified: Secondary | ICD-10-CM | POA: Diagnosis present

## 2019-12-31 DIAGNOSIS — E876 Hypokalemia: Secondary | ICD-10-CM | POA: Diagnosis present

## 2019-12-31 DIAGNOSIS — I252 Old myocardial infarction: Secondary | ICD-10-CM

## 2019-12-31 DIAGNOSIS — Z9181 History of falling: Secondary | ICD-10-CM

## 2019-12-31 DIAGNOSIS — Z8041 Family history of malignant neoplasm of ovary: Secondary | ICD-10-CM

## 2019-12-31 DIAGNOSIS — W19XXXA Unspecified fall, initial encounter: Secondary | ICD-10-CM | POA: Diagnosis not present

## 2019-12-31 DIAGNOSIS — F1410 Cocaine abuse, uncomplicated: Secondary | ICD-10-CM | POA: Diagnosis present

## 2019-12-31 DIAGNOSIS — E1159 Type 2 diabetes mellitus with other circulatory complications: Secondary | ICD-10-CM | POA: Diagnosis not present

## 2019-12-31 DIAGNOSIS — I5043 Acute on chronic combined systolic (congestive) and diastolic (congestive) heart failure: Secondary | ICD-10-CM | POA: Diagnosis present

## 2019-12-31 DIAGNOSIS — E7800 Pure hypercholesterolemia, unspecified: Secondary | ICD-10-CM | POA: Diagnosis present

## 2019-12-31 DIAGNOSIS — Z6824 Body mass index (BMI) 24.0-24.9, adult: Secondary | ICD-10-CM

## 2019-12-31 DIAGNOSIS — F1911 Other psychoactive substance abuse, in remission: Secondary | ICD-10-CM | POA: Diagnosis present

## 2019-12-31 DIAGNOSIS — K219 Gastro-esophageal reflux disease without esophagitis: Secondary | ICD-10-CM | POA: Diagnosis present

## 2019-12-31 DIAGNOSIS — F419 Anxiety disorder, unspecified: Secondary | ICD-10-CM | POA: Diagnosis present

## 2019-12-31 DIAGNOSIS — N179 Acute kidney failure, unspecified: Secondary | ICD-10-CM | POA: Diagnosis present

## 2019-12-31 DIAGNOSIS — R109 Unspecified abdominal pain: Secondary | ICD-10-CM

## 2019-12-31 DIAGNOSIS — Z833 Family history of diabetes mellitus: Secondary | ICD-10-CM

## 2019-12-31 LAB — TROPONIN I (HIGH SENSITIVITY): Troponin I (High Sensitivity): 50 ng/L — ABNORMAL HIGH (ref <18)

## 2019-12-31 LAB — COMPREHENSIVE METABOLIC PANEL
ALT: 14 U/L (ref 0–44)
AST: 23 U/L (ref 15–41)
Albumin: 3.3 g/dL — ABNORMAL LOW (ref 3.5–5.0)
Alkaline Phosphatase: 54 U/L (ref 38–126)
Anion gap: 11 (ref 5–15)
BUN: 14 mg/dL (ref 6–20)
CO2: 24 mmol/L (ref 22–32)
Calcium: 8.4 mg/dL — ABNORMAL LOW (ref 8.9–10.3)
Chloride: 79 mmol/L — ABNORMAL LOW (ref 98–111)
Creatinine, Ser: 1.07 mg/dL (ref 0.61–1.24)
GFR calc Af Amer: >60 mL/min (ref >60)
GFR calc non Af Amer: >60 mL/min (ref >60)
Glucose, Bld: 170 mg/dL — ABNORMAL HIGH (ref 70–99)
Potassium: 4.3 mmol/L (ref 3.5–5.1)
Sodium: 114 mmol/L — CL (ref 135–145)
Total Bilirubin: 0.8 mg/dL (ref 0.3–1.2)
Total Protein: 7.8 g/dL (ref 6.5–8.1)

## 2019-12-31 LAB — URINALYSIS, ROUTINE W REFLEX MICROSCOPIC
Bacteria, UA: NONE SEEN
Bacteria, UA: NONE SEEN
Bilirubin Urine: NEGATIVE
Bilirubin Urine: NEGATIVE
Glucose, UA: NEGATIVE mg/dL
Glucose, UA: NEGATIVE mg/dL
Ketones, ur: NEGATIVE mg/dL
Ketones, ur: NEGATIVE mg/dL
Leukocytes,Ua: NEGATIVE
Leukocytes,Ua: NEGATIVE
Nitrite: NEGATIVE
Nitrite: NEGATIVE
Protein, ur: 100 mg/dL — AB
Protein, ur: 100 mg/dL — AB
Specific Gravity, Urine: 1.033 — ABNORMAL HIGH (ref 1.005–1.030)
Specific Gravity, Urine: 1.02 (ref 1.005–1.030)
pH: 6 (ref 5.0–8.0)
pH: 6 (ref 5.0–8.0)

## 2019-12-31 LAB — TSH: TSH: 4.853 u[IU]/mL — ABNORMAL HIGH (ref 0.350–4.500)

## 2019-12-31 LAB — CBC
HCT: 38.7 % — ABNORMAL LOW (ref 39.0–52.0)
HCT: 36.4 % — ABNORMAL LOW (ref 39.0–52.0)
Hemoglobin: 14.1 g/dL (ref 13.0–17.0)
Hemoglobin: 12.7 g/dL — ABNORMAL LOW (ref 13.0–17.0)
MCH: 32.2 pg (ref 26.0–34.0)
MCH: 31.5 pg (ref 26.0–34.0)
MCHC: 36.4 g/dL — ABNORMAL HIGH (ref 30.0–36.0)
MCHC: 34.9 g/dL (ref 30.0–36.0)
MCV: 88.4 fL (ref 80.0–100.0)
MCV: 90.3 fL (ref 80.0–100.0)
Platelets: 222 10*3/uL (ref 150–400)
Platelets: 228 10*3/uL (ref 150–400)
RBC: 4.38 MIL/uL (ref 4.22–5.81)
RBC: 4.03 MIL/uL — ABNORMAL LOW (ref 4.22–5.81)
RDW: 15.3 % (ref 11.5–15.5)
RDW: 15.7 % — ABNORMAL HIGH (ref 11.5–15.5)
WBC: 4.1 10*3/uL (ref 4.0–10.5)
WBC: 4.4 10*3/uL (ref 4.0–10.5)
nRBC: 0 % (ref 0.0–0.2)
nRBC: 0 % (ref 0.0–0.2)

## 2019-12-31 LAB — BASIC METABOLIC PANEL
Anion gap: 10 (ref 5–15)
BUN: 14 mg/dL (ref 6–20)
CO2: 23 mmol/L (ref 22–32)
Calcium: 8 mg/dL — ABNORMAL LOW (ref 8.9–10.3)
Chloride: 79 mmol/L — ABNORMAL LOW (ref 98–111)
Creatinine, Ser: 1.08 mg/dL (ref 0.61–1.24)
GFR calc Af Amer: >60 mL/min (ref >60)
GFR calc non Af Amer: >60 mL/min (ref >60)
Glucose, Bld: 151 mg/dL — ABNORMAL HIGH (ref 70–99)
Potassium: 4.4 mmol/L (ref 3.5–5.1)
Sodium: 112 mmol/L — CL (ref 135–145)

## 2019-12-31 LAB — RAPID URINE DRUG SCREEN, HOSP PERFORMED
Amphetamines: NOT DETECTED
Barbiturates: NOT DETECTED
Benzodiazepines: NOT DETECTED
Cocaine: POSITIVE — AB
Opiates: NOT DETECTED
Tetrahydrocannabinol: NOT DETECTED

## 2019-12-31 LAB — CREATININE, URINE, RANDOM: Creatinine, Urine: 40.76 mg/dL

## 2019-12-31 LAB — SODIUM, URINE, RANDOM: Sodium, Ur: <10 mmol/L

## 2019-12-31 LAB — LIPASE, BLOOD: Lipase: 28 U/L (ref 11–51)

## 2019-12-31 LAB — LACTIC ACID, PLASMA: Lactic Acid, Venous: 1.6 mmol/L (ref 0.5–1.9)

## 2019-12-31 LAB — SARS CORONAVIRUS 2 (TAT 6-24 HRS): SARS Coronavirus 2: NEGATIVE

## 2019-12-31 MED ORDER — FOLIC ACID 1 MG PO TABS: 1.0000 mg | ORAL_TABLET | Freq: Every day | ORAL | Status: DC

## 2019-12-31 MED ORDER — ACETAMINOPHEN 325 MG PO TABS
650.0000 mg | ORAL_TABLET | Freq: Four times a day (QID) | ORAL | Status: DC | PRN
  Administered 2019-12-31: 650 mg via ORAL
  Filled 2019-12-31: qty 2

## 2019-12-31 MED ORDER — INSULIN ASPART 100 UNIT/ML ~~LOC~~ SOLN: 0.0000 [IU] | SUBCUTANEOUS | Status: DC

## 2019-12-31 MED ORDER — LORAZEPAM 1 MG PO TABS: 1.0000 mg | ORAL_TABLET | ORAL | Status: DC | PRN

## 2019-12-31 MED ORDER — THIAMINE HCL 100 MG/ML IJ SOLN: 100.0000 mg | Freq: Every day | INTRAMUSCULAR | Status: DC

## 2019-12-31 MED ORDER — ONDANSETRON HCL 4 MG/2ML IJ SOLN
4.0000 mg | Freq: Once | INTRAMUSCULAR | Status: AC
  Administered 2019-12-31: 4 mg via INTRAVENOUS
  Filled 2019-12-31: qty 2

## 2019-12-31 MED ORDER — SODIUM CHLORIDE 0.9% FLUSH
3.0000 mL | Freq: Once | INTRAVENOUS | Status: AC
  Administered 2019-12-31: 3 mL via INTRAVENOUS

## 2019-12-31 MED ORDER — SODIUM CHLORIDE 0.9 % IV SOLN: INTRAVENOUS | Status: DC

## 2019-12-31 MED ORDER — SODIUM CHLORIDE 0.9% FLUSH
3.0000 mL | Freq: Once | INTRAVENOUS | Status: AC
  Administered 2020-01-01: 3 mL via INTRAVENOUS

## 2019-12-31 MED ORDER — MORPHINE SULFATE (PF) 2 MG/ML IV SOLN: 2.0000 mg | INTRAVENOUS | Status: DC | PRN

## 2019-12-31 MED ORDER — APIXABAN 5 MG PO TABS
5.0000 mg | ORAL_TABLET | Freq: Two times a day (BID) | ORAL | Status: DC
  Filled 2019-12-31: qty 1

## 2019-12-31 MED ORDER — FENTANYL CITRATE (PF) 100 MCG/2ML IJ SOLN
50.0000 ug | Freq: Once | INTRAMUSCULAR | Status: AC
  Administered 2019-12-31: 50 ug via INTRAVENOUS
  Filled 2019-12-31: qty 2

## 2019-12-31 MED ORDER — IOHEXOL 350 MG/ML SOLN
100.0000 mL | Freq: Once | INTRAVENOUS | Status: AC | PRN
  Administered 2019-12-31: 100 mL via INTRAVENOUS

## 2019-12-31 MED ORDER — SODIUM CHLORIDE 0.9 % IV BOLUS
500.0000 mL | Freq: Once | INTRAVENOUS | Status: AC
  Administered 2019-12-31: 500 mL via INTRAVENOUS

## 2019-12-31 MED ORDER — LORAZEPAM 2 MG/ML IJ SOLN: 1.0000 mg | INTRAMUSCULAR | Status: DC | PRN

## 2019-12-31 MED ORDER — ONDANSETRON HCL 4 MG/2ML IJ SOLN
4.0000 mg | Freq: Four times a day (QID) | INTRAMUSCULAR | Status: DC | PRN
  Administered 2019-12-31: 4 mg via INTRAVENOUS
  Filled 2019-12-31: qty 2

## 2019-12-31 MED ORDER — THIAMINE HCL 100 MG PO TABS: 100.0000 mg | ORAL_TABLET | Freq: Every day | ORAL | Status: DC

## 2019-12-31 MED ORDER — ADULT MULTIVITAMIN W/MINERALS CH: 1.0000 | ORAL_TABLET | Freq: Every day | ORAL | Status: DC

## 2019-12-31 NOTE — ED Notes (Signed)
Pt aware urine sample still needed, urinal at bedside

## 2019-12-31 NOTE — ED Notes (Signed)
Patient transported to CT 

## 2019-12-31 NOTE — ED Triage Notes (Signed)
Patient reports generalized abdominal pain with emesis onset today , no fever or diarrhea . Seen here this morning for the same complaints.
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
## 2019-12-31 NOTE — ED Notes (Signed)
Dr. [place] Crane paged regarding latest sodium level
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
## 2019-12-31 NOTE — ED Triage Notes (Signed)
C/o generalized abd pain and vomiting since this morning.  Denies diarrhea.

## 2020-01-01 DIAGNOSIS — E1159 Type 2 diabetes mellitus with other circulatory complications: Secondary | ICD-10-CM

## 2020-01-01 DIAGNOSIS — E861 Hypovolemia: Secondary | ICD-10-CM | POA: Diagnosis present

## 2020-01-01 DIAGNOSIS — E440 Moderate protein-calorie malnutrition: Secondary | ICD-10-CM | POA: Diagnosis present

## 2020-01-01 DIAGNOSIS — F1010 Alcohol abuse, uncomplicated: Secondary | ICD-10-CM | POA: Diagnosis present

## 2020-01-01 DIAGNOSIS — N183 Chronic kidney disease, stage 3 unspecified: Secondary | ICD-10-CM | POA: Diagnosis present

## 2020-01-01 DIAGNOSIS — G4700 Insomnia, unspecified: Secondary | ICD-10-CM | POA: Diagnosis present

## 2020-01-01 DIAGNOSIS — F329 Major depressive disorder, single episode, unspecified: Secondary | ICD-10-CM | POA: Diagnosis present

## 2020-01-01 DIAGNOSIS — I4892 Unspecified atrial flutter: Secondary | ICD-10-CM | POA: Diagnosis present

## 2020-01-01 DIAGNOSIS — W19XXXA Unspecified fall, initial encounter: Secondary | ICD-10-CM | POA: Diagnosis not present

## 2020-01-01 DIAGNOSIS — I4821 Permanent atrial fibrillation: Secondary | ICD-10-CM | POA: Diagnosis present

## 2020-01-01 DIAGNOSIS — E7800 Pure hypercholesterolemia, unspecified: Secondary | ICD-10-CM | POA: Diagnosis present

## 2020-01-01 DIAGNOSIS — E1122 Type 2 diabetes mellitus with diabetic chronic kidney disease: Secondary | ICD-10-CM | POA: Diagnosis present

## 2020-01-01 DIAGNOSIS — N179 Acute kidney failure, unspecified: Secondary | ICD-10-CM | POA: Diagnosis present

## 2020-01-01 DIAGNOSIS — M1990 Unspecified osteoarthritis, unspecified site: Secondary | ICD-10-CM | POA: Diagnosis present

## 2020-01-01 DIAGNOSIS — E871 Hypo-osmolality and hyponatremia: Secondary | ICD-10-CM | POA: Diagnosis present

## 2020-01-01 DIAGNOSIS — I5022 Chronic systolic (congestive) heart failure: Secondary | ICD-10-CM

## 2020-01-01 DIAGNOSIS — K7460 Unspecified cirrhosis of liver: Secondary | ICD-10-CM | POA: Diagnosis present

## 2020-01-01 DIAGNOSIS — F1410 Cocaine abuse, uncomplicated: Secondary | ICD-10-CM | POA: Diagnosis present

## 2020-01-01 DIAGNOSIS — F419 Anxiety disorder, unspecified: Secondary | ICD-10-CM | POA: Diagnosis present

## 2020-01-01 DIAGNOSIS — I5043 Acute on chronic combined systolic (congestive) and diastolic (congestive) heart failure: Secondary | ICD-10-CM | POA: Diagnosis present

## 2020-01-01 DIAGNOSIS — F17210 Nicotine dependence, cigarettes, uncomplicated: Secondary | ICD-10-CM | POA: Diagnosis present

## 2020-01-01 DIAGNOSIS — Z794 Long term (current) use of insulin: Secondary | ICD-10-CM

## 2020-01-01 DIAGNOSIS — Z20822 Contact with and (suspected) exposure to covid-19: Secondary | ICD-10-CM | POA: Diagnosis present

## 2020-01-01 DIAGNOSIS — B182 Chronic viral hepatitis C: Secondary | ICD-10-CM | POA: Diagnosis present

## 2020-01-01 DIAGNOSIS — D631 Anemia in chronic kidney disease: Secondary | ICD-10-CM | POA: Diagnosis present

## 2020-01-01 DIAGNOSIS — I2510 Atherosclerotic heart disease of native coronary artery without angina pectoris: Secondary | ICD-10-CM | POA: Diagnosis present

## 2020-01-01 DIAGNOSIS — K219 Gastro-esophageal reflux disease without esophagitis: Secondary | ICD-10-CM | POA: Diagnosis present

## 2020-01-01 DIAGNOSIS — I130 Hypertensive heart and chronic kidney disease with heart failure and stage 1 through stage 4 chronic kidney disease, or unspecified chronic kidney disease: Secondary | ICD-10-CM | POA: Diagnosis present

## 2020-01-01 LAB — BASIC METABOLIC PANEL
Anion gap: 9 (ref 5–15)
Anion gap: 8 (ref 5–15)
BUN: 18 mg/dL (ref 6–20)
BUN: 20 mg/dL (ref 6–20)
CO2: 23 mmol/L (ref 22–32)
CO2: 23 mmol/L (ref 22–32)
Calcium: 8.1 mg/dL — ABNORMAL LOW (ref 8.9–10.3)
Calcium: 8.2 mg/dL — ABNORMAL LOW (ref 8.9–10.3)
Chloride: 86 mmol/L — ABNORMAL LOW (ref 98–111)
Chloride: 91 mmol/L — ABNORMAL LOW (ref 98–111)
Creatinine, Ser: 1.3 mg/dL — ABNORMAL HIGH (ref 0.61–1.24)
Creatinine, Ser: 1.61 mg/dL — ABNORMAL HIGH (ref 0.61–1.24)
GFR calc Af Amer: >60 mL/min (ref >60)
GFR calc Af Amer: 53 mL/min — ABNORMAL LOW (ref >60)
GFR calc non Af Amer: 59 mL/min — ABNORMAL LOW (ref >60)
GFR calc non Af Amer: 46 mL/min — ABNORMAL LOW (ref >60)
Glucose, Bld: 138 mg/dL — ABNORMAL HIGH (ref 70–99)
Glucose, Bld: 136 mg/dL — ABNORMAL HIGH (ref 70–99)
Potassium: 5.2 mmol/L — ABNORMAL HIGH (ref 3.5–5.1)
Potassium: 4.7 mmol/L (ref 3.5–5.1)
Sodium: 118 mmol/L — CL (ref 135–145)
Sodium: 122 mmol/L — ABNORMAL LOW (ref 135–145)

## 2020-01-01 LAB — CBC
HCT: 37.4 % — ABNORMAL LOW (ref 39.0–52.0)
Hemoglobin: 13.2 g/dL (ref 13.0–17.0)
MCH: 31.1 pg (ref 26.0–34.0)
MCHC: 35.3 g/dL (ref 30.0–36.0)
MCV: 88.2 fL (ref 80.0–100.0)
Platelets: 216 10*3/uL (ref 150–400)
RBC: 4.24 MIL/uL (ref 4.22–5.81)
RDW: 15.4 % (ref 11.5–15.5)
WBC: 5 10*3/uL (ref 4.0–10.5)
nRBC: 0 % (ref 0.0–0.2)

## 2020-01-01 LAB — PHOSPHORUS: Phosphorus: 2.5 mg/dL (ref 2.5–4.6)

## 2020-01-01 LAB — COMPREHENSIVE METABOLIC PANEL
ALT: 14 U/L (ref 0–44)
ALT: 13 U/L (ref 0–44)
AST: 21 U/L (ref 15–41)
AST: 22 U/L (ref 15–41)
Albumin: 3.1 g/dL — ABNORMAL LOW (ref 3.5–5.0)
Albumin: 3.2 g/dL — ABNORMAL LOW (ref 3.5–5.0)
Alkaline Phosphatase: 50 U/L (ref 38–126)
Alkaline Phosphatase: 54 U/L (ref 38–126)
Anion gap: 9 (ref 5–15)
Anion gap: 11 (ref 5–15)
BUN: 15 mg/dL (ref 6–20)
BUN: 17 mg/dL (ref 6–20)
CO2: 25 mmol/L (ref 22–32)
CO2: 25 mmol/L (ref 22–32)
Calcium: 8.3 mg/dL — ABNORMAL LOW (ref 8.9–10.3)
Calcium: 8.6 mg/dL — ABNORMAL LOW (ref 8.9–10.3)
Chloride: 80 mmol/L — ABNORMAL LOW (ref 98–111)
Chloride: 81 mmol/L — ABNORMAL LOW (ref 98–111)
Creatinine, Ser: 1.21 mg/dL (ref 0.61–1.24)
Creatinine, Ser: 1.34 mg/dL — ABNORMAL HIGH (ref 0.61–1.24)
GFR calc Af Amer: >60 mL/min (ref >60)
GFR calc Af Amer: >60 mL/min (ref >60)
GFR calc non Af Amer: >60 mL/min (ref >60)
GFR calc non Af Amer: 57 mL/min — ABNORMAL LOW (ref >60)
Glucose, Bld: 173 mg/dL — ABNORMAL HIGH (ref 70–99)
Glucose, Bld: 154 mg/dL — ABNORMAL HIGH (ref 70–99)
Potassium: 4.7 mmol/L (ref 3.5–5.1)
Potassium: 4.4 mmol/L (ref 3.5–5.1)
Sodium: 114 mmol/L — CL (ref 135–145)
Sodium: 117 mmol/L — CL (ref 135–145)
Total Bilirubin: 1.1 mg/dL (ref 0.3–1.2)
Total Bilirubin: 1.2 mg/dL (ref 0.3–1.2)
Total Protein: 7 g/dL (ref 6.5–8.1)
Total Protein: 7.1 g/dL (ref 6.5–8.1)

## 2020-01-01 LAB — MAGNESIUM: Magnesium: 1.8 mg/dL (ref 1.7–2.4)

## 2020-01-01 LAB — TROPONIN I (HIGH SENSITIVITY): Troponin I (High Sensitivity): 39 ng/L — ABNORMAL HIGH (ref <18)

## 2020-01-01 LAB — FERRITIN: Ferritin: 117 ng/mL (ref 24–336)

## 2020-01-01 LAB — IRON AND TIBC
Iron: 278 ug/dL — ABNORMAL HIGH (ref 45–182)
Saturation Ratios: 71 % — ABNORMAL HIGH (ref 17.9–39.5)
TIBC: 391 ug/dL (ref 250–450)
UIBC: 113 ug/dL

## 2020-01-01 LAB — GLUCOSE, CAPILLARY
Glucose-Capillary: 146 mg/dL — ABNORMAL HIGH (ref 70–99)
Glucose-Capillary: 156 mg/dL — ABNORMAL HIGH (ref 70–99)
Glucose-Capillary: 143 mg/dL — ABNORMAL HIGH (ref 70–99)
Glucose-Capillary: 151 mg/dL — ABNORMAL HIGH (ref 70–99)
Glucose-Capillary: 148 mg/dL — ABNORMAL HIGH (ref 70–99)

## 2020-01-01 LAB — CORTISOL: Cortisol, Plasma: 12.1 ug/dL

## 2020-01-01 LAB — OSMOLALITY: Osmolality: 256 mOsm/kg — ABNORMAL LOW (ref 275–295)

## 2020-01-01 LAB — BRAIN NATRIURETIC PEPTIDE: B Natriuretic Peptide: 1985.9 pg/mL — ABNORMAL HIGH (ref 0.0–100.0)

## 2020-01-01 LAB — ETHANOL: Alcohol, Ethyl (B): <10 mg/dL (ref <10)

## 2020-01-01 LAB — VITAMIN B12: Vitamin B-12: 138 pg/mL — ABNORMAL LOW (ref 180–914)

## 2020-01-01 LAB — LIPASE, BLOOD: Lipase: 23 U/L (ref 11–51)

## 2020-01-01 LAB — SEDIMENTATION RATE: Sed Rate: 10 mm/hr (ref 0–16)

## 2020-01-01 LAB — CBG MONITORING, ED: Glucose-Capillary: 158 mg/dL — ABNORMAL HIGH (ref 70–99)

## 2020-01-01 MED ORDER — SODIUM ZIRCONIUM CYCLOSILICATE 5 G PO PACK
5.0000 g | PACK | Freq: Once | ORAL | Status: AC
  Administered 2020-01-01: 5 g via ORAL
  Filled 2020-01-01: qty 1

## 2020-01-01 MED ORDER — SODIUM CHLORIDE 0.9 % IV SOLN: Freq: Once | INTRAVENOUS | Status: AC

## 2020-01-01 MED ORDER — ATORVASTATIN CALCIUM 10 MG PO TABS
20.0000 mg | ORAL_TABLET | Freq: Every day | ORAL | Status: DC
  Administered 2020-01-01 – 2020-01-02 (×2): 20 mg via ORAL
  Filled 2020-01-01 (×3): qty 2

## 2020-01-01 MED ORDER — SPIRONOLACTONE 25 MG PO TABS
25.0000 mg | ORAL_TABLET | Freq: Every day | ORAL | Status: DC
  Administered 2020-01-01: 25 mg via ORAL
  Filled 2020-01-01: qty 1

## 2020-01-01 MED ORDER — DIGOXIN 125 MCG PO TABS
0.1250 mg | ORAL_TABLET | Freq: Every day | ORAL | Status: DC
  Administered 2020-01-01 – 2020-01-04 (×4): 0.125 mg via ORAL
  Filled 2020-01-01 (×4): qty 1

## 2020-01-01 MED ORDER — SODIUM CHLORIDE 0.9 % IV SOLN: INTRAVENOUS | Status: AC

## 2020-01-01 MED ORDER — LORAZEPAM 2 MG/ML IJ SOLN
1.0000 mg | INTRAMUSCULAR | Status: AC | PRN
  Administered 2020-01-01 – 2020-01-03 (×3): 2 mg via INTRAVENOUS
  Filled 2020-01-01 (×3): qty 1

## 2020-01-01 MED ORDER — POTASSIUM CHLORIDE CRYS ER 20 MEQ PO TBCR
20.0000 meq | EXTENDED_RELEASE_TABLET | Freq: Every day | ORAL | Status: DC
  Filled 2020-01-01: qty 1

## 2020-01-01 MED ORDER — AMIODARONE HCL 200 MG PO TABS
200.0000 mg | ORAL_TABLET | Freq: Every day | ORAL | Status: DC
  Administered 2020-01-01 – 2020-01-04 (×4): 200 mg via ORAL
  Filled 2020-01-01 (×4): qty 1

## 2020-01-01 MED ORDER — GABAPENTIN 300 MG PO CAPS
600.0000 mg | ORAL_CAPSULE | Freq: Two times a day (BID) | ORAL | Status: DC
  Administered 2020-01-01 – 2020-01-04 (×7): 600 mg via ORAL
  Filled 2020-01-01 (×7): qty 2

## 2020-01-01 MED ORDER — DULOXETINE HCL 60 MG PO CPEP
60.0000 mg | ORAL_CAPSULE | Freq: Every day | ORAL | Status: DC
  Administered 2020-01-01 – 2020-01-04 (×4): 60 mg via ORAL
  Filled 2020-01-01 (×4): qty 1

## 2020-01-01 MED ORDER — ALBUTEROL SULFATE (2.5 MG/3ML) 0.083% IN NEBU: 2.5000 mg | INHALATION_SOLUTION | Freq: Four times a day (QID) | RESPIRATORY_TRACT | Status: DC | PRN

## 2020-01-01 MED ORDER — INSULIN ASPART 100 UNIT/ML ~~LOC~~ SOLN
0.0000 [IU] | Freq: Three times a day (TID) | SUBCUTANEOUS | Status: DC
  Administered 2020-01-01 (×2): 2 [IU] via SUBCUTANEOUS
  Administered 2020-01-01: 1 [IU] via SUBCUTANEOUS
  Administered 2020-01-02 (×2): 2 [IU] via SUBCUTANEOUS
  Administered 2020-01-02: 1 [IU] via SUBCUTANEOUS
  Administered 2020-01-03 – 2020-01-04 (×2): 2 [IU] via SUBCUTANEOUS
  Administered 2020-01-04: 1 [IU] via SUBCUTANEOUS

## 2020-01-01 MED ORDER — MIRTAZAPINE 15 MG PO TABS
30.0000 mg | ORAL_TABLET | Freq: Every day | ORAL | Status: DC
  Administered 2020-01-01 – 2020-01-03 (×3): 30 mg via ORAL
  Filled 2020-01-01 (×3): qty 2

## 2020-01-01 MED ORDER — TORSEMIDE 20 MG PO TABS
40.0000 mg | ORAL_TABLET | Freq: Two times a day (BID) | ORAL | Status: DC
  Administered 2020-01-01 – 2020-01-02 (×3): 40 mg via ORAL
  Filled 2020-01-01 (×3): qty 2

## 2020-01-01 MED ORDER — THIAMINE HCL 100 MG/ML IJ SOLN: 100.0000 mg | Freq: Every day | INTRAMUSCULAR | Status: DC

## 2020-01-01 MED ORDER — NICOTINE 21 MG/24HR TD PT24
21.0000 mg | MEDICATED_PATCH | Freq: Once | TRANSDERMAL | Status: AC
  Administered 2020-01-01: 21 mg via TRANSDERMAL
  Filled 2020-01-01: qty 1

## 2020-01-01 MED ORDER — ISOSORB DINITRATE-HYDRALAZINE 20-37.5 MG PO TABS
1.0000 | ORAL_TABLET | Freq: Three times a day (TID) | ORAL | Status: DC
  Administered 2020-01-01 – 2020-01-04 (×9): 1 via ORAL
  Filled 2020-01-01 (×10): qty 1

## 2020-01-01 MED ORDER — APIXABAN 5 MG PO TABS
5.0000 mg | ORAL_TABLET | Freq: Two times a day (BID) | ORAL | Status: DC
  Administered 2020-01-01 – 2020-01-04 (×7): 5 mg via ORAL
  Filled 2020-01-01 (×7): qty 1

## 2020-01-01 MED ORDER — FOLIC ACID 1 MG PO TABS
1.0000 mg | ORAL_TABLET | Freq: Every day | ORAL | Status: DC
  Administered 2020-01-01 – 2020-01-04 (×4): 1 mg via ORAL
  Filled 2020-01-01 (×4): qty 1

## 2020-01-01 MED ORDER — PROMETHAZINE HCL 25 MG/ML IJ SOLN
25.0000 mg | Freq: Once | INTRAMUSCULAR | Status: AC
  Administered 2020-01-01: 25 mg via INTRAVENOUS
  Filled 2020-01-01: qty 1

## 2020-01-01 MED ORDER — ADULT MULTIVITAMIN W/MINERALS CH
1.0000 | ORAL_TABLET | Freq: Every day | ORAL | Status: DC
  Administered 2020-01-01 – 2020-01-04 (×4): 1 via ORAL
  Filled 2020-01-01 (×4): qty 1

## 2020-01-01 MED ORDER — THIAMINE HCL 100 MG PO TABS
100.0000 mg | ORAL_TABLET | Freq: Every day | ORAL | Status: DC
  Administered 2020-01-01 – 2020-01-04 (×4): 100 mg via ORAL
  Filled 2020-01-01 (×4): qty 1

## 2020-01-01 MED ORDER — LORAZEPAM 1 MG PO TABS: 0.0000 mg | ORAL_TABLET | Freq: Two times a day (BID) | ORAL | Status: DC

## 2020-01-01 MED ORDER — LORAZEPAM 2 MG/ML IJ SOLN: 0.0000 mg | Freq: Two times a day (BID) | INTRAMUSCULAR | Status: DC

## 2020-01-01 MED ORDER — LORAZEPAM 1 MG PO TABS: 1.0000 mg | ORAL_TABLET | ORAL | Status: AC | PRN

## 2020-01-01 MED ORDER — LORAZEPAM 2 MG/ML IJ SOLN
0.0000 mg | Freq: Two times a day (BID) | INTRAMUSCULAR | Status: DC
  Administered 2020-01-03 – 2020-01-04 (×2): 2 mg via INTRAVENOUS
  Administered 2020-01-04: 1 mg via INTRAVENOUS
  Filled 2020-01-01 (×4): qty 1

## 2020-01-01 MED ORDER — LORAZEPAM 2 MG/ML IJ SOLN
0.0000 mg | Freq: Four times a day (QID) | INTRAMUSCULAR | Status: AC
  Administered 2020-01-01 – 2020-01-02 (×2): 2 mg via INTRAVENOUS
  Filled 2020-01-01 (×2): qty 1

## 2020-01-01 MED ORDER — LORAZEPAM 2 MG/ML IJ SOLN
0.0000 mg | Freq: Four times a day (QID) | INTRAMUSCULAR | Status: DC
  Administered 2020-01-01: 1 mg via INTRAVENOUS
  Filled 2020-01-01: qty 1

## 2020-01-01 MED ORDER — INSULIN ASPART 100 UNIT/ML ~~LOC~~ SOLN: 0.0000 [IU] | Freq: Every day | SUBCUTANEOUS | Status: DC

## 2020-01-01 MED ORDER — LORAZEPAM 1 MG PO TABS: 0.0000 mg | ORAL_TABLET | Freq: Four times a day (QID) | ORAL | Status: DC

## 2020-01-01 NOTE — Plan of Care (Signed)
  Problem: Education: Goal: Knowledge of General Education information will improve Description: Including pain rating scale, medication(s)/side effects and non-pharmacologic comfort measures Outcome: Progressing   Problem: Safety: Goal: Ability to remain free from injury will improve Outcome: Progressing   

## 2020-01-01 NOTE — Progress Notes (Addendum)
Patient seen and evaluated, chart reviewed, please see EMR for updated orders. Please see full H&P dictated by admitting physician Dr. Maudie Mercury for same date of service.    Brief Summary:-  59 y.o. male,  with medical history significant forchronic systolic CHF, coronary artery disease, CVA, atrial flutter on Eliquis, syncope, essential hypertension,GERD,chronic hepatitis C with cirrhosis, gastroenteritis, type 2 diabetes, diabetic polyneuropathy, cocaine abuse initially admitted by Dr. Irene Pap on 12/31/2019 but patient left AMA then returned on 01/01/2020 and was readmitted back Dr. Maudie Mercury with finding of hyponatremia and intractable emesis and some diarrhea  A/p 1) hyponatremia--- due to beer potomania and dehydration improving --Na 112 >> 114 >>117 >> 118 >> 122--- continue gentle hydration, continue to check BMP and adjust IV fluid rate and consistently accordingly  Lab Results  Component Value Date   NA 122 (L) 01/01/2020   K 4.7 01/01/2020   CL 91 (L) 01/01/2020   CO2 23 01/01/2020   2)AKI----acute kidney injury worsening renal function due to dehydration in the setting of intractable nausea vomiting and diarrhea - creatinine on admission= 1.0  ,   baseline creatinine = 1.0    , creatinine is now= 1.61 ([place])      , renally adjust medications, avoid nephrotoxic agents/dehydration/hypotension --- Stop Aldactone -Torsemide decreased to 40 mg twice daily   Lab Results  Component Value Date   CREATININE 1.61 (H) 01/01/2020    3) acute metabolic encephalopathy secondary to alcohol abuse and electrolyte disturbance and dehydration=----- hydrate  4)Intractable emesis----mostly resolved at this point, CT abdomen and pelvis without acute findings patient does have cirrhotic findings  5)DM2-patient A1c 7.5 reflecting uncontrolled DM-- Use Novolog/Humalog Sliding scale insulin with Accu-Cheks/Fingersticks as ordered    6)HFrEF--last known EF 20 to 25% based on echo from August  2020 -Clinically appears dry -Continue BiDil -Be judicious with IV fluids to avoid volume overload -Torsemide has been decreased to 40 mg twice daily Stop Aldactone due to worsening AKI  7)H/o atrial flutter/fib----appears rate controlled at this time, -Continue amiodarone 200 mg twice daily, and digoxin 0.125 mg daily for rate control continue Eliquis 5 mg twice daily, for secondary stroke prophylaxis  8) prior history of stroke--- on Eliquis and Lipitor  9) alcohol abuse--- at risk for DTs, lorazepam per CIWA protocol multivitamin as ordered  10) anxiety/depression/neuropathy----stable, continue Cymbalta, Remeron and gabapentin  11) tobacco abuse--- continue nicotine patch  Patient seen and evaluated, chart reviewed, please see EMR for updated orders. Please see full H&P dictated by admitting physician Dr. Maudie Mercury for same date of servic

## 2020-01-01 NOTE — Progress Notes (Signed)
Patient was asleep and per RN, not alert. Patient not appropriate for substance abuse assessment at this time. CSW provided SA resources to the patient's RN.   CSW will continue to follow and assist with disposition planning.   Domenic Schwab, MSW, [place]

## 2020-01-01 NOTE — Progress Notes (Signed)
New Admission Note: ? Arrival Method: Stretcher Mental Orientation: Alert and Oriented x4 Telemetry: 5M17 Assessment: Completed Skin: Refer to flowsheet IV: Right forearm Pain: 0 Tubes: none Safety Measures: Safety Fall Prevention Plan discussed with patient. Admission: Completed 5 [place] Orientation: Patient has been orientated to the room, unit and the staff. Family: None  Orders have been reviewed and are being implemented. Will continue to monitor the patient. Call light has been placed within reach and bed alarm has been activated.  ? Milagros Loll, RN  Phone Number: [phone]

## 2020-01-01 NOTE — Progress Notes (Signed)
CSW acknowledges consult for substance abuse counseling and needing resources.   CSW will plan on meeting with the patient and providing resources. CSW will continue to follow and assist with disposition planning.   Domenic Schwab, MSW, [place]

## 2020-01-01 NOTE — Progress Notes (Signed)
CRITICAL VALUE ALERT  Critical Value: sodium - 118  Date & Time Notied: 01/01/20 @ 7:21am  Provider Notified: Denton Brick  Orders Received/Actions taken: No new orders

## 2020-01-01 NOTE — Progress Notes (Signed)
CRITICAL VALUE ALERT  Critical Value: Sodium - 117  Date & Time Notied: 01/01/2020  Provider Notified: B[place]heimer,NP   Orders Received/Actions taken: Awaiting for Orders
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
## 2020-01-01 NOTE — Progress Notes (Signed)
As I walked down the hallway ,passing to patient's room,i saw him on the act of falling down toward the head part of the bed.He had fallen on his right side of his body,right side of his face hit the wall.Bed alarm was ringing,and his I.V tubing was wrapped around on his right arm.Patient had just moved his bowel in bed and the very moment he was falling ,he was still moving his bowel dripping down in between his legs ,going down the floor.Assessed him,he was able to moved all his extremities without any pain,not complaining of any neuro symptoms like light headedness nor being dizzy.Small bruised at bridge of his nose.no swelling observed.He is more alert at this time to persons, and place and conversant appropriately.I asked him ,if he wanted  me to call his family member about this incident and he responded with a big '' no". M.D. on call made aware.Will monitor.
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
## 2020-01-02 LAB — BASIC METABOLIC PANEL
Anion gap: 9 (ref 5–15)
Anion gap: 9 (ref 5–15)
Anion gap: 9 (ref 5–15)
BUN: 26 mg/dL — ABNORMAL HIGH (ref 6–20)
BUN: 27 mg/dL — ABNORMAL HIGH (ref 6–20)
BUN: 29 mg/dL — ABNORMAL HIGH (ref 6–20)
CO2: 23 mmol/L (ref 22–32)
CO2: 28 mmol/L (ref 22–32)
CO2: 24 mmol/L (ref 22–32)
Calcium: 8 mg/dL — ABNORMAL LOW (ref 8.9–10.3)
Calcium: 8.6 mg/dL — ABNORMAL LOW (ref 8.9–10.3)
Calcium: 8.4 mg/dL — ABNORMAL LOW (ref 8.9–10.3)
Chloride: 92 mmol/L — ABNORMAL LOW (ref 98–111)
Chloride: 90 mmol/L — ABNORMAL LOW (ref 98–111)
Chloride: 93 mmol/L — ABNORMAL LOW (ref 98–111)
Creatinine, Ser: 1.83 mg/dL — ABNORMAL HIGH (ref 0.61–1.24)
Creatinine, Ser: 1.83 mg/dL — ABNORMAL HIGH (ref 0.61–1.24)
Creatinine, Ser: 1.8 mg/dL — ABNORMAL HIGH (ref 0.61–1.24)
GFR calc Af Amer: 45 mL/min — ABNORMAL LOW (ref >60)
GFR calc Af Amer: 45 mL/min — ABNORMAL LOW (ref >60)
GFR calc Af Amer: 46 mL/min — ABNORMAL LOW (ref >60)
GFR calc non Af Amer: 39 mL/min — ABNORMAL LOW (ref >60)
GFR calc non Af Amer: 39 mL/min — ABNORMAL LOW (ref >60)
GFR calc non Af Amer: 40 mL/min — ABNORMAL LOW (ref >60)
Glucose, Bld: 138 mg/dL — ABNORMAL HIGH (ref 70–99)
Glucose, Bld: 136 mg/dL — ABNORMAL HIGH (ref 70–99)
Glucose, Bld: 121 mg/dL — ABNORMAL HIGH (ref 70–99)
Potassium: 4 mmol/L (ref 3.5–5.1)
Potassium: 4.3 mmol/L (ref 3.5–5.1)
Potassium: 4.8 mmol/L (ref 3.5–5.1)
Sodium: 124 mmol/L — ABNORMAL LOW (ref 135–145)
Sodium: 127 mmol/L — ABNORMAL LOW (ref 135–145)
Sodium: 126 mmol/L — ABNORMAL LOW (ref 135–145)

## 2020-01-02 LAB — FOLATE RBC
Folate, Hemolysate: 535 ng/mL
Folate, RBC: 1419 ng/mL (ref >498)
Hematocrit: 37.7 % (ref 37.5–51.0)

## 2020-01-02 LAB — GLUCOSE, CAPILLARY
Glucose-Capillary: 151 mg/dL — ABNORMAL HIGH (ref 70–99)
Glucose-Capillary: 167 mg/dL — ABNORMAL HIGH (ref 70–99)
Glucose-Capillary: 149 mg/dL — ABNORMAL HIGH (ref 70–99)
Glucose-Capillary: 156 mg/dL — ABNORMAL HIGH (ref 70–99)

## 2020-01-02 MED ORDER — NICOTINE 21 MG/24HR TD PT24
21.0000 mg | MEDICATED_PATCH | Freq: Every day | TRANSDERMAL | Status: DC
  Administered 2020-01-04: 21 mg via TRANSDERMAL
  Filled 2020-01-02 (×3): qty 1

## 2020-01-02 MED ORDER — SODIUM CHLORIDE 0.9 % IV SOLN: INTRAVENOUS | Status: AC

## 2020-01-02 NOTE — Progress Notes (Signed)
PROGRESS NOTE    Brad [name]  [id number] DOB: July 23, 1961 DOA: 12/31/2019 PCP: Charlott Rakes, MD     Brief Narrative:  Patient is a 59-year-old male with past medical history of CHF, CAD, previous stroke, atrial flutter, hypertension, chronic hepatitis C with cirrhosis, reflux, type 2 diabetes with neuropathy, and history of cocaine abuse re-admitted on 1/10 for hyponatremia.  He was initially admitted on 1/9 for the same along with nausea and vomiting.  Hyponatremia was initially 112 and as of 1/11 was 124 with gentle hydration.  He is also in acute kidney injury with a baseline creatinine of around 1.   New events last 24 hours / Subjective: Sodium has increased from 122 to 124.  He is eating and drinking normally.  He feels good overall.  No complaints including nausea and vomiting.  Assessment & Plan:   Principal Problem:   Hyponatremia  Continues to improve at an appropriate rate; Na 112 >> 114 >>117 >> 118 >> 122 >> 124 today  Likely secondary to cirrhosis given improvement with hydration  IV fluids 50 mL an hour of normal saline  I will stop the diuretic  Continue to monitor BMP q 6 hrs  Active Problems:   History of drug abuse ([place])  NicoDerm daily  Appreciate social work    History of alcohol abuse  CIWA scale   Folate and B1    DM type 2 (diabetes mellitus, type 2) (HCC)  Continue Lipitor 20 mg daily  Sliding scale insulin    Atrial flutter (HCC)  Continue amiodarone 200 mg twice daily and digoxin 0.125 mg daily  Continue Eliquis 5 mg twice daily    Chronic systolic CHF (congestive heart failure) (HCC)  STOP torsemide 40 mg twice daily  Q shift wt checks  Continue BiDil 20-37.5 mg 3 times daily     Anxiety/depression   Continue Remeron 30 mg nightly  Continue Cymbalta 60 mg daily    Neuropathy  Continue gabapentin 600 mg twice daily   DVT prophylaxis: Eliquis Code Status: Full Family Communication: Self Disposition Plan:  Home   Consultants:   None  Procedures:   None  Antimicrobials:  Anti-infectives (From admission, onward)   None        Objective: Vitals:   01/01/20 1714 01/01/20 2109 01/02/20 0545 01/02/20 0914  BP: 111/66 139/74 110/70 (!) 144/78  Pulse: (!) 57 60 (!) 59 60  Resp: 18 16 16 18   Temp: 97.8 F (36.6 C) 98 F (36.7 C) (!) 97.5 F (36.4 C) 97.6 F (36.4 C)  TempSrc: Oral Oral Oral   SpO2: 100% 99% 99% 95%  Weight:  80.6 kg      Intake/Output Summary (Last 24 hours) at 01/02/2020 1211 Last data filed at 01/02/2020 0900 Gross per 24 hour  Intake 760 ml  Output 100 ml  Net 660 ml   Filed Weights   01/01/20 0251 01/01/20 2109  Weight: 81.9 kg 80.6 kg    Examination:  General exam: Appears calm and comfortable  Respiratory system: Clear to auscultation. Respiratory effort normal. No respiratory distress. No conversational dyspnea.  Cardiovascular system: S1 & S2 heard, RRR. No pedal edema. Gastrointestinal system: Abdomen is nondistended, soft and nontender. Normal bowel sounds heard. Central nervous system: Alert and oriented. No focal neurological deficits. Speech clear.  Extremities: Symmetric in appearance  Skin: No rashes, lesions or ulcers on exposed skin  Psychiatry: Judgement and insight appear normal. Mood & affect appropriate.   Data Reviewed: I have personally reviewed  following labs and imaging studies  CBC: Recent Labs  Lab 12/31/19 1229 12/31/19 2203 01/01/20 0248  WBC 4.1 4.4 5.0  HGB 14.1 12.7* 13.2  HCT 38.7* 36.4* 37.4*  37.7  MCV 88.4 90.3 88.2  PLT 222 228 [id number]   Basic Metabolic Panel: Recent Labs  Lab 12/31/19 2203 01/01/20 0248 01/01/20 0625 01/01/20 1608 01/02/20 0440  NA 114* 117* 118* 122* 124*  K 4.7 4.4 5.2* 4.7 4.0  CL 80* 81* 86* 91* 92*  CO2 25 25 23 23 23   GLUCOSE 173* 154* 138* 136* 138*  BUN 15 17 18 20  26*  CREATININE 1.21 1.34* 1.30* 1.61* 1.83*  CALCIUM 8.3* 8.6* 8.1* 8.2* 8.0*  MG 1.8  --   --   --    --   PHOS 2.5  --   --   --   --    GFR: Estimated Creatinine Clearance: 45.7 mL/min (A) (by C-G formula based on SCr of 1.83 mg/dL (H)). Liver Function Tests: Recent Labs  Lab 12/31/19 1229 12/31/19 2203 01/01/20 0248  AST 23 21 22   ALT 14 14 13   ALKPHOS 54 50 54  BILITOT 0.8 1.1 1.2  PROT 7.8 7.0 7.1  ALBUMIN 3.3* 3.1* 3.2*   Recent Labs  Lab 12/31/19 1229 12/31/19 2203  LIPASE 28 23   CBG: Recent Labs  Lab 01/01/20 1344 01/01/20 1712 01/01/20 2114 01/02/20 0652 01/02/20 1110  GLUCAP 143* 151* 148* 151* 167*   Thyroid Function Tests: Recent Labs    12/31/19 1229  TSH 4.853*   Anemia Panel: Recent Labs    01/01/20 0248  VITAMINB12 138*  FERRITIN 117  TIBC 391  IRON 278*   Sepsis Labs: Recent Labs  Lab 12/31/19 1302  LATICACIDVEN 1.6    Recent Results (from the past 240 hour(s))  SARS CORONAVIRUS 2 (TAT 6-24 HRS) Nasopharyngeal Nasopharyngeal Swab     Status: None   Collection Time: 12/31/19  2:39 PM   Specimen: Nasopharyngeal Swab  Result Value Ref Range Status   SARS Coronavirus 2 NEGATIVE NEGATIVE Final    Comment: (NOTE) SARS-CoV-2 target nucleic acids are NOT DETECTED. The SARS-CoV-2 RNA is generally detectable in upper and lower respiratory specimens during the acute phase of infection. Negative results do not preclude SARS-CoV-2 infection, do not rule out co-infections with other pathogens, and should not be used as the sole basis for treatment or other patient management decisions. Negative results must be combined with clinical observations, patient history, and epidemiological information. The expected result is Negative. Fact Sheet for Patients: SugarRoll.be Fact Sheet for Healthcare Providers: https://www.woods-mathews.com/ This test is not yet approved or cleared by the [place] FDA and  has been authorized for detection and/or diagnosis of SARS-CoV-2 by FDA under an Emergency Use  Authorization (EUA). This EUA will remain  in effect (meaning this test can be used) for the duration of the COVID-19 declaration under Section 56 4(b)(1) of the Act, 21 U.S.C. section 360bbb-3(b)(1), unless the authorization is terminated or revoked sooner. Performed at [facility], [place] [address] [place], [place] [address]       Radiology Studies: CT Angio Abd/Pel W and/or Wo Contrast  Result Date: 12/31/2019 CLINICAL DATA:  Abdominal pain. History of atrial fibrillation. Concern for mesenteric ischemia. EXAM: CTA ABDOMEN AND PELVIS WITHOUT AND WITH CONTRAST TECHNIQUE: Multidetector CT imaging of the abdomen and pelvis was performed using the standard protocol during bolus administration of intravenous contrast. Multiplanar reconstructed images and MIPs were obtained and reviewed  to evaluate the vascular anatomy. CONTRAST:  175mL OMNIPAQUE IOHEXOL 350 MG/ML SOLN COMPARISON:  August 22, 2019 FINDINGS: VASCULAR Aorta: Atherosclerotic changes are seen in the thoracic aorta. No aneurysm identified. No dissection identified. Celiac: Patent without evidence of aneurysm, dissection, vasculitis or significant stenosis. SMA: Patent without evidence of aneurysm, dissection, vasculitis or significant stenosis. Renals: Both renal arteries are patent without evidence of aneurysm, dissection, vasculitis, fibromuscular dysplasia or significant stenosis. IMA: Patent without evidence of aneurysm, dissection, vasculitis or significant stenosis. Inflow: Patent without evidence of aneurysm, dissection, vasculitis or significant stenosis. Proximal Outflow: Bilateral common femoral and visualized portions of the superficial and profunda femoral arteries are patent without evidence of aneurysm, dissection, vasculitis or significant stenosis. Veins: No obvious venous abnormality within the limitations of this arterial phase study. Review of the MIP images confirms the above findings. NON-VASCULAR Lower chest:  Bilateral pleural effusions with atelectasis are identified, right greater than left. Cardiomegaly. Hepatobiliary: There is a nodular contour to the liver consistent with with cirrhosis. No masses are seen in the liver on arterial or venous phases. The gallbladder wall is mildly prominent but stable, likely due to underlying liver disease. The portal vein is patent. Pancreas: Unremarkable. No pancreatic ductal dilatation or surrounding inflammatory changes. Spleen: Normal in size without focal abnormality. Adrenals/Urinary Tract: Adrenal glands are unremarkable. No suspicious renal masses identified. There is a cyst on the left. Another cyst is seen in the lower pole on the left. No perinephric stranding or hydronephrosis. The ureters are unremarkable. The bladder is somewhat thick wall, possibly due to less distension in the interval. No suspicious masses. Stomach/Bowel: The stomach and small bowel are normal. Colon and visualized appendix are normal. Lymphatic: No significant vascular findings are present. No enlarged abdominal or pelvic lymph nodes. Reproductive: Prostate is unremarkable. Other: Haziness in the mesenteric and retroperitoneal fat is identified, extending into the pelvis. The finding was also seen on the previous study but is mildly more pronounced today. No free air identified. Musculoskeletal: No acute or significant osseous findings. IMPRESSION: VASCULAR 1. No aneurysm or dissection.  Atherosclerotic changes NON-VASCULAR 1. Cirrhosis. 2. Increased attenuation in the fat of the mesentery and peritoneum is likely due to volume overload. 3. Bilateral pleural effusions.  Cardiomegaly. 4. The bladder is somewhat thick walled, possibly due to less distension in the interval. Recommend clinical correlation to exclude signs of cystitis. 5. The gallbladder wall is prominent, probably due to cirrhosis as the finding is stable. Recommend clinical correlation. Electronically Signed   By: Dorise Bullion III  M.D   On: 12/31/2019 15:13      Scheduled Meds: . amiodarone  200 mg Oral Daily  . apixaban  5 mg Oral BID  . atorvastatin  20 mg Oral q1800  . digoxin  0.125 mg Oral Daily  . DULoxetine  60 mg Oral Daily  . folic acid  1 mg Oral Daily  . gabapentin  600 mg Oral BID  . insulin aspart  0-5 Units Subcutaneous QHS  . insulin aspart  0-9 Units Subcutaneous TID WC  . isosorbide-hydrALAZINE  1 tablet Oral TID  . LORazepam  0-4 mg Intravenous Q6H   Followed by  . [START ON 01/03/2020] LORazepam  0-4 mg Intravenous Q12H  . mirtazapine  30 mg Oral QHS  . multivitamin with minerals  1 tablet Oral Daily  . nicotine  21 mg Transdermal Daily  . thiamine  100 mg Oral Daily   Or  . thiamine  100 mg Intravenous Daily  .  torsemide  40 mg Oral BID   Continuous Infusions: . sodium chloride       LOS: 1 day   Time spent: 23 minutes   Shelda Pal, DO [facility] 01/02/2020, 12:11 PM   Available via Epic secure chat 7am-7pm After these hours, please refer to coverage provider listed on [email or web address]

## 2020-01-02 NOTE — Plan of Care (Signed)
  Problem: Education: Goal: Knowledge of General Education information will improve Description: Including pain rating scale, medication(s)/side effects and non-pharmacologic comfort measures Outcome: Progressing   Problem: Coping: Goal: Level of anxiety will decrease Outcome: Progressing   Problem: Safety: Goal: Ability to remain free from injury will improve Outcome: Progressing   

## 2020-01-02 NOTE — Discharge Instructions (Signed)

## 2020-01-02 NOTE — Plan of Care (Signed)
  Problem: Activity: Goal: Risk for activity intolerance will decrease Outcome: Progressing   

## 2020-01-03 LAB — BASIC METABOLIC PANEL
Anion gap: 10 (ref 5–15)
Anion gap: 11 (ref 5–15)
Anion gap: 10 (ref 5–15)
Anion gap: 7 (ref 5–15)
BUN: 30 mg/dL — ABNORMAL HIGH (ref 6–20)
BUN: 28 mg/dL — ABNORMAL HIGH (ref 6–20)
BUN: 25 mg/dL — ABNORMAL HIGH (ref 6–20)
BUN: 24 mg/dL — ABNORMAL HIGH (ref 6–20)
CO2: 26 mmol/L (ref 22–32)
CO2: 25 mmol/L (ref 22–32)
CO2: 24 mmol/L (ref 22–32)
CO2: 25 mmol/L (ref 22–32)
Calcium: 8.4 mg/dL — ABNORMAL LOW (ref 8.9–10.3)
Calcium: 8.6 mg/dL — ABNORMAL LOW (ref 8.9–10.3)
Calcium: 8.6 mg/dL — ABNORMAL LOW (ref 8.9–10.3)
Calcium: 8.3 mg/dL — ABNORMAL LOW (ref 8.9–10.3)
Chloride: 90 mmol/L — ABNORMAL LOW (ref 98–111)
Chloride: 88 mmol/L — ABNORMAL LOW (ref 98–111)
Chloride: 89 mmol/L — ABNORMAL LOW (ref 98–111)
Chloride: 92 mmol/L — ABNORMAL LOW (ref 98–111)
Creatinine, Ser: 1.74 mg/dL — ABNORMAL HIGH (ref 0.61–1.24)
Creatinine, Ser: 1.76 mg/dL — ABNORMAL HIGH (ref 0.61–1.24)
Creatinine, Ser: 1.44 mg/dL — ABNORMAL HIGH (ref 0.61–1.24)
Creatinine, Ser: 1.73 mg/dL — ABNORMAL HIGH (ref 0.61–1.24)
GFR calc Af Amer: 48 mL/min — ABNORMAL LOW (ref >60)
GFR calc Af Amer: 48 mL/min — ABNORMAL LOW (ref >60)
GFR calc Af Amer: >60 mL/min (ref >60)
GFR calc Af Amer: 49 mL/min — ABNORMAL LOW (ref >60)
GFR calc non Af Amer: 42 mL/min — ABNORMAL LOW (ref >60)
GFR calc non Af Amer: 41 mL/min — ABNORMAL LOW (ref >60)
GFR calc non Af Amer: 52 mL/min — ABNORMAL LOW (ref >60)
GFR calc non Af Amer: 42 mL/min — ABNORMAL LOW (ref >60)
Glucose, Bld: 183 mg/dL — ABNORMAL HIGH (ref 70–99)
Glucose, Bld: 143 mg/dL — ABNORMAL HIGH (ref 70–99)
Glucose, Bld: 127 mg/dL — ABNORMAL HIGH (ref 70–99)
Glucose, Bld: 184 mg/dL — ABNORMAL HIGH (ref 70–99)
Potassium: 4 mmol/L (ref 3.5–5.1)
Potassium: 4.8 mmol/L (ref 3.5–5.1)
Potassium: 4 mmol/L (ref 3.5–5.1)
Potassium: 4.1 mmol/L (ref 3.5–5.1)
Sodium: 126 mmol/L — ABNORMAL LOW (ref 135–145)
Sodium: 124 mmol/L — ABNORMAL LOW (ref 135–145)
Sodium: 123 mmol/L — ABNORMAL LOW (ref 135–145)
Sodium: 124 mmol/L — ABNORMAL LOW (ref 135–145)

## 2020-01-03 LAB — CBC
HCT: 37.4 % — ABNORMAL LOW (ref 39.0–52.0)
Hemoglobin: 12.6 g/dL — ABNORMAL LOW (ref 13.0–17.0)
MCH: 31.3 pg (ref 26.0–34.0)
MCHC: 33.7 g/dL (ref 30.0–36.0)
MCV: 93 fL (ref 80.0–100.0)
Platelets: 198 10*3/uL (ref 150–400)
RBC: 4.02 MIL/uL — ABNORMAL LOW (ref 4.22–5.81)
RDW: 16.3 % — ABNORMAL HIGH (ref 11.5–15.5)
WBC: 5.5 10*3/uL (ref 4.0–10.5)
nRBC: 0 % (ref 0.0–0.2)

## 2020-01-03 LAB — GLUCOSE, CAPILLARY
Glucose-Capillary: 161 mg/dL — ABNORMAL HIGH (ref 70–99)
Glucose-Capillary: 117 mg/dL — ABNORMAL HIGH (ref 70–99)
Glucose-Capillary: 145 mg/dL — ABNORMAL HIGH (ref 70–99)
Glucose-Capillary: 153 mg/dL — ABNORMAL HIGH (ref 70–99)

## 2020-01-03 MED ORDER — TORSEMIDE 20 MG PO TABS
40.0000 mg | ORAL_TABLET | Freq: Two times a day (BID) | ORAL | Status: DC
  Administered 2020-01-03 – 2020-01-04 (×2): 40 mg via ORAL
  Filled 2020-01-03 (×3): qty 2

## 2020-01-03 MED ORDER — SODIUM CHLORIDE 0.9 % IV SOLN: INTRAVENOUS | Status: DC

## 2020-01-03 NOTE — Progress Notes (Signed)
Patient was refusing nurse starting new iv.  Patient states he is leaving.

## 2020-01-03 NOTE — Progress Notes (Signed)
This RN was walking past the patients room and found him sitting on the floor covered in blood, which was coming from his IV site in which the IV had been removed by patient. Mariea Clonts, staff RN called to the patients bedside. Vital signs obtained and documented in flowsheets. Assessment completed and WDL. Patient denies pain. Patient stated that he hit his head, but no obvious head injuries were noted. MD notified and made aware. MD to place orders for CT of head. Patient cleaned up and assisted to chair. Chair alarm in place, call bell within reach, urinal within reach, and patient provided with something to drink. Education provided to patient on the importance of calling staff before getting up to prevent falls from occurring again. Patient verbalized understanding and teach back demonstration was provided by patient on how to use call bell. Door open to patients room.

## 2020-01-03 NOTE — Progress Notes (Signed)
Patient refused to get weighed.  States he is leaving AMA.  Told patient he needed to have his ride to pick him up in the room since he fell today.

## 2020-01-03 NOTE — Progress Notes (Signed)
PROGRESS NOTE    Brad [name]  [id number] DOB: 04/24/1961 DOA: 12/31/2019 PCP: Charlott Rakes, MD  Brief Narrative:  Patient is a 59 year old male with past medical history of CHF, CAD, previous stroke, atrial flutter, hypertension, chronic hepatitis C with cirrhosis, reflux, type 2 diabetes with neuropathy, and history of cocaine abuse re-admitted on 1/10 for hyponatremia.  He was initially admitted on 1/9 for the same along with nausea and vomiting.  Hyponatremia was initially 112 and as of 1/11 was 124 with gentle hydration.  He is also in acute kidney injury with a baseline creatinine of around 1.  Assessment & Plan:   Principal Problem:   Hyponatremia Active Problems:   History of drug abuse ([place])   DM type 2 (diabetes mellitus, type 2) (HCC)   Atrial flutter (HCC)   Chronic systolic CHF (congestive heart failure) (HCC)  Principal Problem:   Hyponatremia             Continues to improve at an appropriate rate; Na 112 >> 114 >>117 >> 118 >> 122 >> 124 >>125 >> 127 >> 126 >> 124 >> 123             Likely secondary to cirrhosis given improvement with hydration             IV fluids 50 mL an hour of normal saline--lost IV and diuretics stopped--eating a lot of ice             I will re-start the diuretic             Continue to monitor BMP q 6 hrs  Active Problems:   History of drug abuse (HCC)             NicoDerm daily             Appreciate social work    History of alcohol abuse             CIWA scale              Folate and B1    DM type 2 (diabetes mellitus, type 2) (HCC)             Continue Lipitor 20 mg daily             Sliding scale insulin    Atrial flutter (HCC)             Continue amiodarone 200 mg twice daily and digoxin 0.125 mg daily             Continue Eliquis 5 mg twice daily    Chronic systolic CHF (congestive heart failure) (HCC)             Continue torsemide 40 mg twice daily             Q shift wt checks             Continue BiDil  20-37.5 mg 3 times daily                Anxiety/depression                Continue Remeron 30 mg nightly             Continue Cymbalta 60 mg daily    Neuropathy             Continue gabapentin 600 mg twice daily  DVT prophylaxis: Eliquis Code Status: Full Family Communication: patient Disposition Plan: home   Consultants:  None  Procedures:  none  Antimicrobials:  Anti-infectives (From admission, onward)   None       Subjective: S/p fall this am. Worsening sodium. Pulled out IV during fall. States he hit is head. No evidence of trauma. States he is leaving AMA, though he is not able to ambulate by himself as yet.  Objective: Vitals:   01/02/20 2114 01/03/20 0550 01/03/20 0600 01/03/20 1044  BP: (!) 163/95 (!) 171/96 (!) 169/96 (!) 159/77  Pulse: 61 74 76 87  Resp: 18 18  20   Temp: 98 F (36.7 C) 98 F (36.7 C)  98 F (36.7 C)  TempSrc: Oral Oral  Oral  SpO2: 100% 100%  100%  Weight: 80.6 kg  81.1 kg     Intake/Output Summary (Last 24 hours) at 01/03/2020 1733 Last data filed at 01/03/2020 1517 Gross per 24 hour  Intake 1090 ml  Output 4700 ml  Net -3610 ml   Filed Weights   01/01/20 2109 01/02/20 2114 01/03/20 0600  Weight: 80.6 kg 80.6 kg 81.1 kg    Examination:  General exam: Appears calm and comfortable  Respiratory system: Clear to auscultation. Respiratory effort normal. Cardiovascular system: S1 & S2 heard, RRR.  Gastrointestinal system: Abdomen is nondistended, soft and nontender. Central nervous system: Alert  Extremities: Symmetric Skin: No rashes, lesions or ulcers  Data Reviewed: I have personally reviewed following labs and imaging studies  CBC: Recent Labs  Lab 12/31/19 1229 12/31/19 2203 01/01/20 0248 01/03/20 0531  WBC 4.1 4.4 5.0 5.5  HGB 14.1 12.7* 13.2 12.6*  HCT 38.7* 36.4* 37.4*  37.7 37.4*  MCV 88.4 90.3 88.2 93.0  PLT 222 228 216 [id number]   Basic Metabolic Panel: Recent Labs  Lab 12/31/19 2203 01/02/20 1348  01/02/20 1851 01/03/20 0023 01/03/20 0531 01/03/20 1201  NA 114* 127* 126* 126* 124* 123*  K 4.7 4.3 4.8 4.0 4.8 4.0  CL 80* 90* 93* 90* 88* 89*  CO2 25 28 24 26 25 24   GLUCOSE 173* 136* 121* 183* 143* 127*  BUN 15 27* 29* 30* 28* 25*  CREATININE 1.21 1.83* 1.80* 1.74* 1.76* 1.44*  CALCIUM 8.3* 8.6* 8.4* 8.4* 8.6* 8.6*  MG 1.8  --   --   --   --   --   PHOS 2.5  --   --   --   --   --    GFR: Estimated Creatinine Clearance: 58.1 mL/min (A) (by C-G formula based on SCr of 1.44 mg/dL (H)). Liver Function Tests: Recent Labs  Lab 12/31/19 1229 12/31/19 2203 01/01/20 0248  AST 23 21 22   ALT 14 14 13   ALKPHOS 54 50 54  BILITOT 0.8 1.1 1.2  PROT 7.8 7.0 7.1  ALBUMIN 3.3* 3.1* 3.2*   Recent Labs  Lab 12/31/19 1229 12/31/19 2203  LIPASE 28 23   CBG: Recent Labs  Lab 01/02/20 1600 01/02/20 2113 01/03/20 0735 01/03/20 1117 01/03/20 1620  GLUCAP 149* 156* 161* 117* 145*   Anemia Panel: Recent Labs    01/01/20 0248  VITAMINB12 138*  FERRITIN 117  TIBC 391  IRON 278*   Urine analysis:    Component Value Date/Time   COLORURINE YELLOW 12/31/2019 2155   APPEARANCEUR CLEAR 12/31/2019 2155   LABSPEC 1.020 12/31/2019 2155   PHURINE 6.0 12/31/2019 2155   GLUCOSEU NEGATIVE 12/31/2019 2155   HGBUR SMALL (A) 12/31/2019 2155   BILIRUBINUR NEGATIVE 12/31/2019 2155   BILIRUBINUR neg 05/08/2017 1503   KETONESUR NEGATIVE 12/31/2019 2155  PROTEINUR 100 (A) 12/31/2019 2155   UROBILINOGEN 0.2 05/08/2017 1503   UROBILINOGEN 1.0 04/26/2012 1328   NITRITE NEGATIVE 12/31/2019 2155   LEUKOCYTESUR NEGATIVE 12/31/2019 2155   Sepsis Labs:  Recent Results (from the past 240 hour(s))  SARS CORONAVIRUS 2 (TAT 6-24 HRS) Nasopharyngeal Nasopharyngeal Swab     Status: None   Collection Time: 12/31/19  2:39 PM   Specimen: Nasopharyngeal Swab  Result Value Ref Range Status   SARS Coronavirus 2 NEGATIVE NEGATIVE Final    Comment: (NOTE) SARS-CoV-2 target nucleic acids are NOT  DETECTED. The SARS-CoV-2 RNA is generally detectable in upper and lower respiratory specimens during the acute phase of infection. Negative results do not preclude SARS-CoV-2 infection, do not rule out co-infections with other pathogens, and should not be used as the sole basis for treatment or other patient management decisions. Negative results must be combined with clinical observations, patient history, and epidemiological information. The expected result is Negative. Fact Sheet for Patients: SugarRoll.be Fact Sheet for Healthcare Providers: https://www.woods-mathews.com/ This test is not yet approved or cleared by the [place] FDA and  has been authorized for detection and/or diagnosis of SARS-CoV-2 by FDA under an Emergency Use Authorization (EUA). This EUA will remain  in effect (meaning this test can be used) for the duration of the COVID-19 declaration under Section 56 4(b)(1) of the Act, 21 U.S.C. section 360bbb-3(b)(1), unless the authorization is terminated or revoked sooner. Performed at [facility], [place] [address] [place], [place] [address]        Scheduled Meds: . amiodarone  200 mg Oral Daily  . apixaban  5 mg Oral BID  . atorvastatin  20 mg Oral q1800  . digoxin  0.125 mg Oral Daily  . DULoxetine  60 mg Oral Daily  . folic acid  1 mg Oral Daily  . gabapentin  600 mg Oral BID  . insulin aspart  0-5 Units Subcutaneous QHS  . insulin aspart  0-9 Units Subcutaneous TID WC  . isosorbide-hydrALAZINE  1 tablet Oral TID  . LORazepam  0-4 mg Intravenous Q12H  . mirtazapine  30 mg Oral QHS  . multivitamin with minerals  1 tablet Oral Daily  . nicotine  21 mg Transdermal Daily  . thiamine  100 mg Oral Daily   Or  . thiamine  100 mg Intravenous Daily   Continuous Infusions:   LOS: 2 days    Donnamae Jude, MD [facility] Pager [phone]  If 7PM-7AM, please contact night-coverage [email or web address]  Password [facility] 01/03/2020, 5:33 PM

## 2020-01-03 NOTE — Progress Notes (Signed)
Called to room where patient was found in floor.  Paged Kennon Rounds MD and advised of situation.  MD stated to hold off on any CT or MRI for patient.  Will continue to monitor.

## 2020-01-04 LAB — BASIC METABOLIC PANEL
Anion gap: 9 (ref 5–15)
Anion gap: 11 (ref 5–15)
Anion gap: 10 (ref 5–15)
BUN: 25 mg/dL — ABNORMAL HIGH (ref 6–20)
BUN: 26 mg/dL — ABNORMAL HIGH (ref 6–20)
BUN: 25 mg/dL — ABNORMAL HIGH (ref 6–20)
CO2: 25 mmol/L (ref 22–32)
CO2: 24 mmol/L (ref 22–32)
CO2: 25 mmol/L (ref 22–32)
Calcium: 8.4 mg/dL — ABNORMAL LOW (ref 8.9–10.3)
Calcium: 8.3 mg/dL — ABNORMAL LOW (ref 8.9–10.3)
Calcium: 8.5 mg/dL — ABNORMAL LOW (ref 8.9–10.3)
Chloride: 92 mmol/L — ABNORMAL LOW (ref 98–111)
Chloride: 93 mmol/L — ABNORMAL LOW (ref 98–111)
Chloride: 95 mmol/L — ABNORMAL LOW (ref 98–111)
Creatinine, Ser: 1.61 mg/dL — ABNORMAL HIGH (ref 0.61–1.24)
Creatinine, Ser: 1.53 mg/dL — ABNORMAL HIGH (ref 0.61–1.24)
Creatinine, Ser: 1.47 mg/dL — ABNORMAL HIGH (ref 0.61–1.24)
GFR calc Af Amer: 53 mL/min — ABNORMAL LOW (ref >60)
GFR calc Af Amer: 56 mL/min — ABNORMAL LOW (ref >60)
GFR calc Af Amer: 59 mL/min — ABNORMAL LOW (ref >60)
GFR calc non Af Amer: 46 mL/min — ABNORMAL LOW (ref >60)
GFR calc non Af Amer: 49 mL/min — ABNORMAL LOW (ref >60)
GFR calc non Af Amer: 51 mL/min — ABNORMAL LOW (ref >60)
Glucose, Bld: 142 mg/dL — ABNORMAL HIGH (ref 70–99)
Glucose, Bld: 142 mg/dL — ABNORMAL HIGH (ref 70–99)
Glucose, Bld: 130 mg/dL — ABNORMAL HIGH (ref 70–99)
Potassium: 4 mmol/L (ref 3.5–5.1)
Potassium: 4.1 mmol/L (ref 3.5–5.1)
Potassium: 3.6 mmol/L (ref 3.5–5.1)
Sodium: 126 mmol/L — ABNORMAL LOW (ref 135–145)
Sodium: 128 mmol/L — ABNORMAL LOW (ref 135–145)
Sodium: 130 mmol/L — ABNORMAL LOW (ref 135–145)

## 2020-01-04 LAB — GLUCOSE, CAPILLARY
Glucose-Capillary: 137 mg/dL — ABNORMAL HIGH (ref 70–99)
Glucose-Capillary: 155 mg/dL — ABNORMAL HIGH (ref 70–99)

## 2020-01-04 MED ORDER — TORSEMIDE 20 MG PO TABS: 40.0000 mg | ORAL_TABLET | Freq: Two times a day (BID) | ORAL | 1 refills | Status: DC

## 2020-01-04 MED ORDER — ATORVASTATIN CALCIUM 20 MG PO TABS: 20.0000 mg | ORAL_TABLET | Freq: Every day | ORAL | 1 refills | Status: DC

## 2020-01-04 MED ORDER — THIAMINE HCL 100 MG PO TABS: 100.0000 mg | ORAL_TABLET | Freq: Every day | ORAL | 1 refills | Status: DC

## 2020-01-04 MED ORDER — FOLIC ACID 1 MG PO TABS: 1.0000 mg | ORAL_TABLET | Freq: Every day | ORAL | 1 refills | Status: DC

## 2020-01-04 MED ORDER — CYANOCOBALAMIN 1000 MCG/ML IJ SOLN
1000.0000 ug | Freq: Once | INTRAMUSCULAR | Status: AC
  Administered 2020-01-04: 1000 ug via INTRAMUSCULAR
  Filled 2020-01-04: qty 1

## 2020-01-04 NOTE — Evaluation (Signed)
Physical Therapy Evaluation Patient Details Name: Brad [name] MRN: [id number] DOB: 1961/07/01 Today's Date: 01/04/2020   History of Present Illness  Patient is a 59-year-old male with past medical history of CHF, CAD, previous stroke, atrial flutter, hypertension, chronic hepatitis C with cirrhosis, reflux, type 2 diabetes with neuropathy, and history of cocaine abuse re-admitted on 1/10 for hyponatremia and acute kidney injury.   Clinical Impression  Pt admitted with above. Pt presents with decreased functional mobility secondary to dynamic balance impairments and gait abnormalities (likely baseline). Ambulating 400 feet with no assistive device at a supervision level. Education provided re: fall prevention/safety, proper footwear, daily foot inspections due to decreased sensation. Pt would benefit from a cane for further independence and stability with negotiating both level and unlevel surfaces. No PT follow up anticipated.     Follow Up Recommendations No PT follow up;Supervision - Intermittent    Equipment Recommendations  Cane    Recommendations for Other Services       Precautions / Restrictions Precautions Precautions: Fall Precaution Comments: 2 inpatient falls Restrictions Weight Bearing Restrictions: No      Mobility  Bed Mobility Overal bed mobility: Modified Independent                Transfers Overall transfer level: Modified independent Equipment used: None                Ambulation/Gait Ambulation/Gait assistance: Supervision Gait Distance (Feet): 400 Feet Assistive device: None Gait Pattern/deviations: Step-through pattern;Decreased stance time - right;Decreased stride length Gait velocity: decreased   General Gait Details: Decreased left foot clearance, cues for segmental turning with reduced speed for safety  Stairs            Wheelchair Mobility    Modified [name] (Stroke Patients Only)       Balance Overall balance  assessment: Mild deficits observed, not formally tested                                           Pertinent Vitals/Pain Pain Assessment: No/denies pain    Home Living Family/patient expects to be discharged to:: Private residence Living Arrangements: Non-relatives/Friends   Type of Home: Apartment Home Access: Level entry     Home Layout: One level        Prior Function Level of Independence: Independent               Hand Dominance   Dominant Hand: Right    Extremity/Trunk Assessment   Upper Extremity Assessment Upper Extremity Assessment: Overall WFL for tasks assessed    Lower Extremity Assessment Lower Extremity Assessment: RLE deficits/detail RLE Sensation: decreased light touch       Communication   Communication: No difficulties  Cognition Arousal/Alertness: Awake/alert Behavior During Therapy: WFL for tasks assessed/performed Overall Cognitive Status: Impaired/Different from baseline Area of Impairment: Safety/judgement                         Safety/Judgement: Decreased awareness of deficits            General Comments      Exercises     Assessment/Plan    PT Assessment Patient needs continued PT services  PT Problem List Decreased strength;Decreased balance;Decreased mobility       PT Treatment Interventions Gait training;Stair training;Functional mobility training;DME instruction;Therapeutic activities;Therapeutic exercise;Balance training;Patient/family education    PT Goals (  Current goals can be found in the Care Plan section)  Acute Rehab PT Goals Patient Stated Goal: "go home." PT Goal Formulation: With patient Time For Goal Achievement: 01/18/20 Potential to Achieve Goals: Good    Frequency Min 3X/week   Barriers to discharge        Co-evaluation               AM-PAC PT "6 Clicks" Mobility  Outcome Measure Help needed turning from your back to your side while in a flat bed  without using bedrails?: None Help needed moving from lying on your back to sitting on the side of a flat bed without using bedrails?: None Help needed moving to and from a bed to a chair (including a wheelchair)?: None Help needed standing up from a chair using your arms (e.g., wheelchair or bedside chair)?: None Help needed to walk in hospital room?: None Help needed climbing 3-5 steps with a railing? : A Little 6 Click Score: 23    End of Session Equipment Utilized During Treatment: Gait belt Activity Tolerance: Patient tolerated treatment well Patient left: in bed;with call bell/phone within reach Nurse Communication: Mobility status PT Visit Diagnosis: Unsteadiness on feet (R26.81);Other abnormalities of gait and mobility (R26.89);History of falling (Z91.81)    Time: [id number] PT Time Calculation (min) (ACUTE ONLY): 18 min   Charges:   PT Evaluation $PT Eval Moderate Complexity: 1 Mod          Ellamae Sia, [place], DPT Acute Rehabilitation Services Pager [phone] Office [phone]   Willy Eddy 01/04/2020, 1:59 PM

## 2020-01-04 NOTE — Progress Notes (Signed)
DISCHARGE NOTE HOME Brad [name] to be discharged Home per MD order. Discussed prescriptions and follow up appointments with the patient. Prescriptions given to patient; medication list explained in detail. Patient verbalized understanding.  Skin clean, dry and intact without evidence of skin break down, no evidence of skin tears noted. IV catheter discontinued intact. Site without signs and symptoms of complications. Dressing and pressure applied. Pt denies pain at the site currently. No complaints noted.  Patient free of lines, drains, and wounds.   An After Visit Summary (AVS) was printed and given to the patient. Patient escorted via wheelchair, and discharged home via private auto.  Arlyss Repress, RN

## 2020-01-04 NOTE — Plan of Care (Signed)
  Problem: Activity: Goal: Risk for activity intolerance will decrease Outcome: Progressing   

## 2020-01-04 NOTE — Discharge Summary (Signed)
Physician Discharge Summary  Patient ID: Brad [name] MRN: [id number] DOB/AGE: 1961/11/28 59 y.o.  Admit date: 12/31/2019 Discharge date: 01/04/2020  Admission Diagnoses: Principal Problem:   Hyponatremia Active Problems:   History of drug abuse ([place])   DM type 2 (diabetes mellitus, type 2) (HCC)   Atrial flutter (HCC)   Chronic systolic CHF (congestive heart failure) ([place])   Discharge Diagnoses:  Principal Problem:   Hyponatremia Active Problems:   History of drug abuse ([place])   DM type 2 (diabetes mellitus, type 2) (HCC)   Atrial flutter (HCC)   Chronic systolic CHF (congestive heart failure) (HCC)  Items for follow-up: B 12 low at 138--repleted IM in hospital- will need f/u May need improved BP control  Discharged Condition: good  Hospital Course:  Patient is a 59 year old male with past medical history of CHF, CAD, previous stroke, atrial flutter, hypertension, chronic hepatitis C with cirrhosis, reflux, type 2 diabetes with neuropathy, and history of cocaine abusere-admitted on 1/10for hyponatremia. He was initially admitted on 1/9 for the same along with nausea and vomiting. Hyponatremia was initially 112 and as of 1/11 was 124 with gentle hydration. He is also in acute kidney injury with a baseline creatinine of around 1. See problem based charting Hyponatremia Continues to improve at an appropriate rate; Na112 >> 114 >>117 >> 118 >> 122>> 124 >>125 >> 127 >> 126 >> 124 >> 123 >> 124 >>126 >>128 >> 130 Likely secondary to cirrhosis given improvement with hydration IV fluids 50 mL an hour of normal saline--lost IV and diuretics stopped--eating a lot of ice Re-started the diuretic  History of drug abuse (HCC) NicoDerm daily  History of alcohol abuse CIWA scale  Folate and B1  DM type 2 (diabetes mellitus, type 2) (HCC) Continue Lipitor 20 mg  daily Sliding scale insulin  Atrial flutter (HCC) Continue amiodarone 200 mg twice daily and digoxin 0.125 mg daily Continue Eliquis 5 mg twice daily  Chronic systolic CHF (congestive heart failure) (HCC) Continuetorsemide 40 mg twice daily Q shift wt checks Continue BiDil 20-37.5 mg 3 times daily  Anxiety/depression Continue Remeron 30 mg nightly Continue Cymbalta 60 mg daily  Neuropathy Continue gabapentin 600 mg twice daily  Consults: None  Significant Diagnostic Studies:  CBC    Component Value Date/Time   WBC 5.5 01/03/2020 0531   RBC 4.02 (L) 01/03/2020 0531   HGB 12.6 (L) 01/03/2020 0531   HGB 14.1 01/29/2018 1048   HCT 37.4 (L) 01/03/2020 0531   HCT 37.7 01/01/2020 0248   PLT 198 01/03/2020 0531   PLT 189 01/29/2018 1048   MCV 93.0 01/03/2020 0531   MCV 94 01/29/2018 1048   MCH 31.3 01/03/2020 0531   MCHC 33.7 01/03/2020 0531   RDW 16.3 (H) 01/03/2020 0531   RDW 13.0 01/29/2018 1048   LYMPHSABS 0.8 08/03/2019 0523   LYMPHSABS 1.7 01/29/2018 1048   MONOABS 0.5 08/03/2019 0523   EOSABS 0.1 08/03/2019 0523   EOSABS 0.1 01/29/2018 1048   BASOSABS 0.0 08/03/2019 0523   BASOSABS 0.0 01/29/2018 1048   CMP Latest Ref Rng & Units 01/04/2020 01/04/2020 01/04/2020  Glucose 70 - 99 mg/dL 130(H) 142(H) 142(H)  BUN 6 - 20 mg/dL 25(H) 26(H) 25(H)  Creatinine 0.61 - 1.24 mg/dL 1.47(H) 1.53(H) 1.61(H)  Sodium 135 - 145 mmol/L 130(L) 128(L) 126(L)  Potassium 3.5 - 5.1 mmol/L 3.6 4.1 4.0  Chloride 98 - 111 mmol/L 95(L) 93(L) 92(L)  CO2 22 - 32 mmol/L 25 24 25   Calcium 8.9 - 10.3  mg/dL 8.5(L) 8.3(L) 8.4(L)  Total Protein 6.5 - 8.1 g/dL - - -  Total Bilirubin 0.3 - 1.2 mg/dL - - -  Alkaline Phos 38 - 126 U/L - - -  AST 15 - 41 U/L - - -  ALT 0 - 44 U/L - - -   CBG (last 3)  Recent Labs    01/03/20 2130 01/04/20 0736 01/04/20 1112   GLUCAP 153* 137* 155*   Vitamin B-12 180 - 914 pg/mL 138Low     Folate, RBC >498 ng/mL 1,419   Ferritin 24 - 336 ng/mL 117    Cortisol, Plasma ug/dL 12.1    Treatments: IV hydration and B 12 repletion  Discharge Exam: Blood pressure 129/66, pulse 79, temperature 98.1 F (36.7 C), temperature source Oral, resp. rate 18, weight 80.9 kg, SpO2 97 %.  General exam: Appears calm and comfortable  Respiratory system: Clear to auscultation. Respiratory effort normal. Cardiovascular system: S1 & S2 heard, RRR.  Gastrointestinal system: Abdomen is nondistended, soft and nontender. Central nervous system: Alert  Extremities: Symmetric Skin: No rashes, lesions or ulcers Disposition: Discharge disposition: 01-Home or Self Care       Discharge Instructions    Call MD for:  difficulty breathing, headache or visual disturbances   Complete by: As directed    Call MD for:  persistant nausea and vomiting   Complete by: As directed    Call MD for:  redness, tenderness, or signs of infection (pain, swelling, redness, odor or green/yellow discharge around incision site)   Complete by: As directed    Call MD for:  severe uncontrolled pain   Complete by: As directed    Call MD for:  temperature >100.4   Complete by: As directed    Diet - low sodium heart healthy   Complete by: As directed    Increase activity slowly   Complete by: As directed      Allergies as of 01/04/2020      Reactions   Angiotensin Receptor Blockers Anaphylaxis, Other (See Comments)   (Angioedema also with Lisinopril, therefore ARB's are contraindicated)   Lisinopril Anaphylaxis, Other (See Comments)   Throat swells   Pamelor [nortriptyline Hcl] Anaphylaxis, Swelling   Throat swells      Medication List    TAKE these medications   albuterol 108 (90 Base) MCG/ACT inhaler Commonly known as: VENTOLIN HFA Inhale 2 puffs into the lungs every 6 (six) hours as needed for shortness of breath.   amiodarone 200 MG  tablet Commonly known as: PACERONE Take 200 mg by mouth daily. What changed: Another medication with the same name was removed. Continue taking this medication, and follow the directions you see here.   amitriptyline 10 MG tablet Commonly known as: ELAVIL Take 10 mg by mouth at bedtime.   apixaban 5 MG Tabs tablet Commonly known as: Eliquis Take 1 tablet (5 mg total) by mouth 2 (two) times daily.   atorvastatin 20 MG tablet Commonly known as: LIPITOR Take 1 tablet (20 mg total) by mouth daily at 6 PM.   digoxin 0.125 MG tablet Commonly known as: LANOXIN Take 1 tablet (0.125 mg total) by mouth daily.   DULoxetine 60 MG capsule Commonly known as: CYMBALTA Take 1 capsule (60 mg total) by mouth daily.   folic acid 1 MG tablet Commonly known as: FOLVITE Take 1 tablet (1 mg total) by mouth daily. Start taking on: January 05, 2020   gabapentin 300 MG capsule Commonly known as: NEURONTIN Take 2  capsules (600 mg total) by mouth 2 (two) times daily.   isosorbide-hydrALAZINE 20-37.5 MG tablet Commonly known as: BIDIL Take 1 tablet by mouth 3 (three) times daily.   Lantus SoloStar 100 UNIT/ML Solostar Pen Generic drug: Insulin Glargine Inject 3 Units into the skin daily.   mirtazapine 30 MG tablet Commonly known as: REMERON Take 30 mg by mouth at bedtime.   potassium chloride SA 20 MEQ tablet Commonly known as: KLOR-CON Take 1 tablet (20 mEq total) by mouth daily.   spironolactone 25 MG tablet Commonly known as: ALDACTONE Take 1 tablet (25 mg total) by mouth daily.   thiamine 100 MG tablet Take 1 tablet (100 mg total) by mouth daily. Start taking on: January 05, 2020   torsemide 20 MG tablet Commonly known as: DEMADEX Take 2 tablets (40 mg total) by mouth 2 (two) times daily. Take 4 tablets every morning and take 3 tablets every evening What changed:   how much to take  when to take this  Another medication with the same name was removed. Continue taking this  medication, and follow the directions you see here.      Follow-up Information    Charlott Rakes, MD. Schedule an appointment as soon as possible for a visit in 1 week(s).   Specialty: Family Medicine Contact information: [place] [address] [phone]           Signed: Donnamae Jude 01/04/2020, 2:43 PM

## 2020-01-05 ENCOUNTER — Telehealth: Payer: Self-pay

## 2020-01-05 NOTE — Telephone Encounter (Signed)
Transition Care Management Follow-up Telephone Call Date of discharge and from where: 01/04/2020, [facility]   Call placed to # [phone], message left with call back requested to this CM.  Patient needs appointment with PCP

## 2020-01-06 ENCOUNTER — Telehealth: Payer: Self-pay

## 2020-01-06 NOTE — Telephone Encounter (Signed)
Transition Care Management Follow-up Telephone Call  Date of discharge and from where: 01/04/2020, [facility]  How have you been since you were released from the hospital? He said that he's "fine."  Any questions or concerns? No questions/concerns at this time   Items Reviewed:  Did the pt receive and understand the discharge instructions provided? He said that he had the instructions and has no questions  Medications obtained and verified? He said that he has all of his medications including the new ones and he did not have any questions..  He did not want to review the medication list. The EMT provides assistance with med management,   Any new allergies since your discharge? None reported  Do you have support at home? Lives alone.  His roommate recently moved out.  The patient has recently experienced the sudden  loss of his sister and said that he is " dealing with a lot." plans to follow up with [facility]  Other (ie: DME, Home Health, etc) no home health  Has community paramedicine visits weekly.    He has been given a glucometer.    Functional Questionnaire: (I = Independent and D = Dependent) ADL's: independent. Has had cane to use in the past when needed    Follow up appointments reviewed:    PCP Hospital f/u appt confirmed? Scheduled an appt with Dr Margarita Rana 01/16/2020 @ [facility] f/u appt confirmed?cardiology- 01/10/2020. Has appointment with [facility] next week but was not sure of the day  Are transportation arrangements needed?no. He has [employer] and can arrange medicaid transportation to his medical appts if needed.  If their condition worsens, is the pt aware to call  their PCP or go to the ED?yes  Was the patient provided with contact information for the PCP's office or ED? He has the phone number for this clinic  Was the pt encouraged to call back with questions or concerns?yes

## 2020-01-06 NOTE — Congregational Nurse Program (Signed)
Brad [name] requested and was given a cane for assistance in ambulation.

## 2020-01-09 ENCOUNTER — Other Ambulatory Visit (HOSPITAL_COMMUNITY): Payer: Self-pay

## 2020-01-09 NOTE — Progress Notes (Signed)
Paramedicine Encounter    Patient ID: Brad [name], male    DOB: August 26, 1961, 59 y.o.   MRN: [id number]   Patient Care Team: Charlott Rakes, MD as PCP - General (Family Medicine) Debara Pickett Nadean Corwin, MD as PCP - Cardiology (Cardiology) Charlott Rakes, MD (Family Medicine)  Patient Active Problem List   Diagnosis Date Noted  . Chronic systolic CHF (congestive heart failure) ([place]) 10/31/2019  . Acute systolic CHF (congestive heart failure) ([place]) 08/02/2019  . Diarrhea 07/29/2019  . Gastroenteritis 07/22/2019  . Hypomagnesemia 06/19/2019  . Chest pain 06/07/2019  . Elevated troponin 05/23/2019  . Tobacco abuse 05/23/2019  . Alcohol abuse 02/21/2019  . Frequent falls 01/17/2019  . Severe mitral regurgitation   . Fall 11/01/2018  . Hyponatremia 10/31/2018  . Chronic atrial fibrillation   . Chronic hyponatremia 08/16/2018  . NSVT (nonsustained ventricular tachycardia) ([place])   . Concussion with loss of consciousness   . Scalp laceration   . Trauma   . Permanent atrial fibrillation   . Hypertensive heart disease   . Shortness of breath   . Pyogenic inflammation of bone ([place])   . Cirrhosis ([place]) 03/23/2018  . Right ankle pain 03/23/2018  . Syncope 08/07/2017  . Acute on chronic combined systolic and diastolic CHF (congestive heart failure) ([place]) 07/09/2017  . Atrial flutter ([place]) 07/09/2017  . Pre-syncope 07/08/2017  . Neuropathy 05/08/2017  . Substance induced mood disorder ([place]) 10/06/2016  . CVA (cerebral vascular accident) ([place]) 09/18/2016  . Left sided numbness   . Homelessness 08/21/2016  . S/P ORIF (open reduction internal fixation) fracture 08/01/2016  . CAD (coronary artery disease), native coronary artery 07/30/2016  . Surgery, elective   . Insomnia 07/22/2016  . NSTEMI (non-ST elevated myocardial infarction) ([place])   . Anemia 07/05/2016  . Thrombocytopenia ([place]) 07/05/2016  . Cocaine abuse ([place]) 07/02/2016  . Chest pain on breathing 07/01/2016  . Essential  hypertension 07/01/2016  . DM type 2 (diabetes mellitus, type 2) ([place]) 07/01/2016  . Hypokalemia 07/01/2016  . CKD (chronic kidney disease) stage 3, GFR 30-59 ml/min 07/01/2016  . Painful diabetic neuropathy ([place]) 07/01/2016  . Polysubstance abuse ([place]) 05/27/2016  . Chronic hepatitis C with cirrhosis ([place]) 05/27/2016  . Chronic diastolic congestive heart failure ([place]) 01/31/2016  . Depression 04/21/2012  . GERD (gastroesophageal reflux disease) 02/16/2012  . History of drug abuse ([place])   . Heroin addiction ([place]) 01/29/2012    Current Outpatient Medications:  .  amiodarone (PACERONE) 200 MG tablet, Take 200 mg by mouth daily., Disp: , Rfl:  .  amitriptyline (ELAVIL) 10 MG tablet, Take 10 mg by mouth at bedtime. , Disp: , Rfl:  .  apixaban (ELIQUIS) 5 MG TABS tablet, Take 1 tablet (5 mg total) by mouth 2 (two) times daily., Disp: 60 tablet, Rfl: 6 .  atorvastatin (LIPITOR) 20 MG tablet, Take 1 tablet (20 mg total) by mouth daily at 6 PM., Disp: 30 tablet, Rfl: 1 .  digoxin (LANOXIN) 0.125 MG tablet, Take 1 tablet (0.125 mg total) by mouth daily., Disp: 30 tablet, Rfl: 11 .  DULoxetine (CYMBALTA) 60 MG capsule, Take 1 capsule (60 mg total) by mouth daily., Disp: 30 capsule, Rfl: 6 .  isosorbide-hydrALAZINE (BIDIL) 20-37.5 MG tablet, Take 1 tablet by mouth 3 (three) times daily., Disp: 90 tablet, Rfl: 11 .  mirtazapine (REMERON) 30 MG tablet, Take 30 mg by mouth at bedtime., Disp: , Rfl:  .  potassium chloride SA (KLOR-CON) 20 MEQ tablet, Take 1 tablet (20 mEq total) by  mouth daily., Disp: 45 tablet, Rfl: 6 .  spironolactone (ALDACTONE) 25 MG tablet, Take 1 tablet (25 mg total) by mouth daily., Disp: 30 tablet, Rfl: 11 .  torsemide (DEMADEX) 20 MG tablet, Take 2 tablets (40 mg total) by mouth 2 (two) times daily. Take 4 tablets every morning and take 3 tablets every evening, Disp: 60 tablet, Rfl: 1 .  albuterol (VENTOLIN HFA) 108 (90 Base) MCG/ACT inhaler, Inhale 2 puffs into the lungs every 6  (six) hours as needed for shortness of breath. , Disp: , Rfl:  .  folic acid (FOLVITE) 1 MG tablet, Take 1 tablet (1 mg total) by mouth daily., Disp: 30 tablet, Rfl: 1 .  gabapentin (NEURONTIN) 300 MG capsule, Take 2 capsules (600 mg total) by mouth 2 (two) times daily., Disp: 120 capsule, Rfl: 3 .  Insulin Glargine (LANTUS SOLOSTAR) 100 UNIT/ML Solostar Pen, Inject 3 Units into the skin daily. (Patient not taking: Reported on 12/19/2019), Disp: 3 pen, Rfl: 3 .  thiamine 100 MG tablet, Take 1 tablet (100 mg total) by mouth daily., Disp: 30 tablet, Rfl: 1 Allergies  Allergen Reactions  . Angiotensin Receptor Blockers Anaphylaxis and Other (See Comments)    (Angioedema also with Lisinopril, therefore ARB's are contraindicated)  . Lisinopril Anaphylaxis and Other (See Comments)    Throat swells  . Pamelor [Nortriptyline Hcl] Anaphylaxis and Swelling    Throat swells     Social History   Socioeconomic History  . Marital status: Married    Spouse name: Not on file  . Number of children: 3  . Years of education: 2y college  . Highest education level: Not on file  Occupational History  . 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 Smoking status: Current Every Day Smoker    Packs/day: 0.50    Years: 28.00    Pack years: 14.00    Types: Cigarettes  . Smokeless tobacco: Never Used  Substance and Sexual Activity  . Alcohol use: Yes    Alcohol/week: 1.0 standard drinks    Types: 1 Cans of beer per week    Comment: "I drink ~ 40oz beer/day"  . Drug use: Yes    Types: IV, Cocaine, Heroin    Comment: 08/17/2018 "no heroin since 2013; I use cocaine ~ once/month, last use 03/21/2019  . Sexual activity: Not Currently  Other Topics Concern  . Not on file  Social History Narrative   ** Merged History Encounter **       Incarcerated from 2006-2010, then 10/2011-12/2011.  Has been trying to get sober (no heroin, alcohol since 10/2011).    Social  Determinants of Health   Financial Resource Strain:   . Difficulty of Paying Living Expenses: Not on file  Food Insecurity:   . Worried About Charity fundraiser in the Last Year: Not on file  . Ran Out of Food in the Last Year: Not on file  Transportation Needs:   . Lack of Transportation (Medical): Not on file  . Lack of Transportation (Non-Medical): Not on file  Physical Activity:   . Days of Exercise per Week: Not on file  . Minutes of Exercise per Session: Not on file  Stress:   . Feeling of Stress : Not on file  Social Connections:   . Frequency of Communication with Friends and Family: Not on file  . Frequency of Social Gatherings with Friends and Family: Not on file  . Attends  Religious Services: Not on file  . Active Member of Clubs or Organizations: Not on file  . Attends Archivist Meetings: Not on file  . Marital Status: Not on file  Intimate Partner Violence:   . Fear of Current or Ex-Partner: Not on file  . Emotionally Abused: Not on file  . Physically Abused: Not on file  . Sexually Abused: Not on file    Physical Exam Vitals reviewed.  Constitutional:      Appearance: He is normal weight.  HENT:     Head: Normocephalic.     Nose: Nose normal.     Mouth/Throat:     Mouth: Mucous membranes are dry.  Eyes:     Pupils: Pupils are equal, round, and reactive to light.  Cardiovascular:     Rate and Rhythm: Normal rate. Rhythm irregular.     Heart sounds: Normal heart sounds.  Pulmonary:     Effort: Pulmonary effort is normal.     Breath sounds: Normal breath sounds.  Abdominal:     General: Abdomen is flat.     Palpations: Abdomen is soft.  Musculoskeletal:        General: Normal range of motion.     Cervical back: Normal range of motion.     Right lower leg: No edema.     Left lower leg: No edema.  Skin:    General: Skin is warm and dry.     Capillary Refill: Capillary refill takes less than 2 seconds.  Neurological:     Mental Status: He  is alert. Mental status is at baseline.  Psychiatric:        Mood and Affect: Mood normal.     Arrived for home visit for Collan who greeted me outside with no signs of distress. Witt reports feeling good and that he has not been taking his medicines because he was in the hospital and was unable to see me last week for a pill box filling. I reviewed discharge and compared medications confirming same and filling pill box accordingly. Vitals were obtained. Bradd stated he is feeling great with no shortness of breath, falls or trouble sleeping. Jontez reports having an appointment with [facility] this week but says he will have to call and follow up due to not remembering appointment day and time. I also reminded patient of heart failure clinic appointment tomorrow at 3:00. He says [name] will be taking him. I will follow up with patient after appointment via phone.  Thelton agreed to home visit next week.  Meds Ordered:  Amitriptyline Potassium   CBG- [id number]     Future Appointments  Date Time Provider [place]  01/10/2020  3:00 PM [facility] PA/NP [facility] None  01/16/2020 10:50 AM Charlott Rakes, MD [facility] None  02/13/2020 11:10 AM Charlott Rakes, MD [facility] None  02/21/2020  9:40 AM Larey Dresser, MD [facility] None     ACTION: Home visit completed

## 2020-01-10 ENCOUNTER — Other Ambulatory Visit: Payer: Self-pay

## 2020-01-10 ENCOUNTER — Encounter (HOSPITAL_COMMUNITY): Payer: Self-pay

## 2020-01-10 ENCOUNTER — Ambulatory Visit (HOSPITAL_COMMUNITY)
Admission: RE | Admit: 2020-01-10 | Discharge: 2020-01-10 | Disposition: A | Discharge status: Home / Self Care | Payer: Medicaid Other | Source: Ambulatory Visit | Attending: Cardiology | Admitting: Cardiology

## 2020-01-10 VITALS — BP 146/84 | HR 81 | Wt 180.8 lb

## 2020-01-10 DIAGNOSIS — I4892 Unspecified atrial flutter: Secondary | ICD-10-CM | POA: Diagnosis not present

## 2020-01-10 DIAGNOSIS — I428 Other cardiomyopathies: Secondary | ICD-10-CM | POA: Insufficient documentation

## 2020-01-10 DIAGNOSIS — N183 Chronic kidney disease, stage 3 unspecified: Secondary | ICD-10-CM | POA: Diagnosis not present

## 2020-01-10 DIAGNOSIS — F1410 Cocaine abuse, uncomplicated: Secondary | ICD-10-CM | POA: Insufficient documentation

## 2020-01-10 DIAGNOSIS — F1010 Alcohol abuse, uncomplicated: Secondary | ICD-10-CM | POA: Insufficient documentation

## 2020-01-10 DIAGNOSIS — F17210 Nicotine dependence, cigarettes, uncomplicated: Secondary | ICD-10-CM | POA: Insufficient documentation

## 2020-01-10 DIAGNOSIS — F1911 Other psychoactive substance abuse, in remission: Secondary | ICD-10-CM | POA: Insufficient documentation

## 2020-01-10 DIAGNOSIS — Z8249 Family history of ischemic heart disease and other diseases of the circulatory system: Secondary | ICD-10-CM | POA: Insufficient documentation

## 2020-01-10 DIAGNOSIS — R079 Chest pain, unspecified: Secondary | ICD-10-CM | POA: Insufficient documentation

## 2020-01-10 DIAGNOSIS — Z794 Long term (current) use of insulin: Secondary | ICD-10-CM | POA: Insufficient documentation

## 2020-01-10 DIAGNOSIS — Z7901 Long term (current) use of anticoagulants: Secondary | ICD-10-CM | POA: Diagnosis not present

## 2020-01-10 DIAGNOSIS — E1140 Type 2 diabetes mellitus with diabetic neuropathy, unspecified: Secondary | ICD-10-CM | POA: Insufficient documentation

## 2020-01-10 DIAGNOSIS — N179 Acute kidney failure, unspecified: Secondary | ICD-10-CM | POA: Insufficient documentation

## 2020-01-10 DIAGNOSIS — I130 Hypertensive heart and chronic kidney disease with heart failure and stage 1 through stage 4 chronic kidney disease, or unspecified chronic kidney disease: Secondary | ICD-10-CM | POA: Insufficient documentation

## 2020-01-10 DIAGNOSIS — I5022 Chronic systolic (congestive) heart failure: Secondary | ICD-10-CM

## 2020-01-10 DIAGNOSIS — E1122 Type 2 diabetes mellitus with diabetic chronic kidney disease: Secondary | ICD-10-CM | POA: Diagnosis not present

## 2020-01-10 DIAGNOSIS — F329 Major depressive disorder, single episode, unspecified: Secondary | ICD-10-CM | POA: Insufficient documentation

## 2020-01-10 DIAGNOSIS — Z888 Allergy status to other drugs, medicaments and biological substances status: Secondary | ICD-10-CM | POA: Insufficient documentation

## 2020-01-10 DIAGNOSIS — K7460 Unspecified cirrhosis of liver: Secondary | ICD-10-CM | POA: Diagnosis not present

## 2020-01-10 DIAGNOSIS — I5042 Chronic combined systolic (congestive) and diastolic (congestive) heart failure: Secondary | ICD-10-CM | POA: Diagnosis not present

## 2020-01-10 DIAGNOSIS — J449 Chronic obstructive pulmonary disease, unspecified: Secondary | ICD-10-CM | POA: Insufficient documentation

## 2020-01-10 DIAGNOSIS — Z8041 Family history of malignant neoplasm of ovary: Secondary | ICD-10-CM | POA: Insufficient documentation

## 2020-01-10 DIAGNOSIS — E7800 Pure hypercholesterolemia, unspecified: Secondary | ICD-10-CM | POA: Insufficient documentation

## 2020-01-10 DIAGNOSIS — I4819 Other persistent atrial fibrillation: Secondary | ICD-10-CM

## 2020-01-10 DIAGNOSIS — Z79899 Other long term (current) drug therapy: Secondary | ICD-10-CM | POA: Insufficient documentation

## 2020-01-10 DIAGNOSIS — R9431 Abnormal electrocardiogram [ECG] [EKG]: Secondary | ICD-10-CM | POA: Insufficient documentation

## 2020-01-10 DIAGNOSIS — Z803 Family history of malignant neoplasm of breast: Secondary | ICD-10-CM | POA: Insufficient documentation

## 2020-01-10 LAB — DIGOXIN LEVEL: Digoxin Level: 0.4 ng/mL — ABNORMAL LOW (ref 0.8–2.0)

## 2020-01-10 LAB — CBC
HCT: 36.5 % — ABNORMAL LOW (ref 39.0–52.0)
Hemoglobin: 12.4 g/dL — ABNORMAL LOW (ref 13.0–17.0)
MCH: 31.8 pg (ref 26.0–34.0)
MCHC: 34 g/dL (ref 30.0–36.0)
MCV: 93.6 fL (ref 80.0–100.0)
Platelets: 251 10*3/uL (ref 150–400)
RBC: 3.9 MIL/uL — ABNORMAL LOW (ref 4.22–5.81)
RDW: 15.9 % — ABNORMAL HIGH (ref 11.5–15.5)
WBC: 5 10*3/uL (ref 4.0–10.5)
nRBC: 0 % (ref 0.0–0.2)

## 2020-01-10 LAB — BASIC METABOLIC PANEL
Anion gap: 11 (ref 5–15)
BUN: 20 mg/dL (ref 6–20)
CO2: 23 mmol/L (ref 22–32)
Calcium: 8.2 mg/dL — ABNORMAL LOW (ref 8.9–10.3)
Chloride: 95 mmol/L — ABNORMAL LOW (ref 98–111)
Creatinine, Ser: 1.47 mg/dL — ABNORMAL HIGH (ref 0.61–1.24)
GFR calc Af Amer: 59 mL/min — ABNORMAL LOW (ref >60)
GFR calc non Af Amer: 51 mL/min — ABNORMAL LOW (ref >60)
Glucose, Bld: 159 mg/dL — ABNORMAL HIGH (ref 70–99)
Potassium: 3.9 mmol/L (ref 3.5–5.1)
Sodium: 129 mmol/L — ABNORMAL LOW (ref 135–145)

## 2020-01-10 LAB — BRAIN NATRIURETIC PEPTIDE: B Natriuretic Peptide: 1118.5 pg/mL — ABNORMAL HIGH (ref 0.0–100.0)
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
## 2020-01-10 NOTE — Progress Notes (Signed)
CSW consulted to speak with pt regarding current concerns of depression and housing concerns.  Pt reports he is severely depressed largely in part to the recent loss of his son.  Pt currently going to [facility] (about to go to his second visit) and sounds like they are doing some counseling and managing medications.  CSW discussed possibility of also seeing [facility] for bereavement counseling services- pt agreeable and CSW provided with their number.  Pt also reports very unsanitary living situation due to roach infestation.  CSW offered to help pt get traps/poison but pt states that it is so rampant that it wouldn't do anything.  Pt is hopeful to move.  CSW provided pt with list of affordable housing options in this area and several applications for him and his roommate, [name], to start on and get on waiting lists for housing options.  Pt states he has transportation and can follow up with housing applications.  CSW will also plan to call pt tomorrow to complete [employer] application for the senior housing voucher program.  CSW will continue to follow and assist as needed  [name], [place] [facility] Desk#: [phone] Cell#: [phone]

## 2020-01-10 NOTE — Addendum Note (Signed)
Encounter addended by: [name], LCSW on: 01/10/2020 5:07 PM  Actions taken: Clinical Note Signed

## 2020-01-10 NOTE — Patient Instructions (Signed)
It was great to see you today! No medication changes are needed at this time.  Labs today We will only contact you if something comes back abnormal or we need to make some changes. Otherwise no news is good news!  Your physician recommends that you schedule a follow-up appointment in: 6 weeks with Dr Aundra Dubin  Do the following things EVERYDAY: 1) Weigh yourself in the morning before breakfast. Write it down and keep it in a log. 2) Take your medicines as prescribed 3) Eat low salt foods--Limit salt (sodium) to 2000 mg per day.  4) Stay as active as you can everyday 5) Limit all fluids for the day to less than 2 liters  At the [facility], you and your health needs are our priority. As part of our continuing mission to provide you with exceptional heart care, we have created designated Provider Care Teams. These Care Teams include your primary Cardiologist (physician) and Advanced Practice Providers (APPs- Physician Assistants and Nurse Practitioners) who all work together to provide you with the care you need, when you need it.   You may see any of the following providers on your designated Care Team at your next follow up: Marland Kitchen Dr Glori Bickers . Dr Loralie Champagne . Darrick Grinder, NP . Lyda Jester, PA . Audry Riles, PharmD   Please be sure to bring in all your medications bottles to every appointment.

## 2020-01-11 ENCOUNTER — Telehealth (HOSPITAL_COMMUNITY): Payer: Self-pay | Admitting: Licensed Clinical Social Worker

## 2020-01-11 NOTE — Telephone Encounter (Signed)
CSW called pt back to help get on waitlist for [employer] program for those over 55.  CSW assisted pt with completing application- confirmed application submitted- pt will receive email when his name comes up on the waitlist.  CSW will continue to follow and assist as needed  [name], [place] [facility] Desk#: [phone] Cell#: [phone]

## 2020-01-12 ENCOUNTER — Telehealth: Payer: Self-pay

## 2020-01-12 NOTE — Telephone Encounter (Signed)
Call received from the patient. He said that he completed the application for the housing authority voucher program yesterday with the assistance of Tammy Sours, [place].  He said that he contacted some of the apartments listed on [email or web address] but there are "backed up".  He said that and his roommate, [name] have no plans to leave their current apartment despite the living conditions.  He also plans to follow up with [facility] as scheduled next month   This CM reminded him of the need to save some of his income to be able to afford housing  - security deposit, possible application fees and monthly rent.  The suggestion of designating a payee was also encouraged by this CM/

## 2020-01-13 ENCOUNTER — Telehealth: Payer: Self-pay | Admitting: Licensed Clinical Social Worker

## 2020-01-13 NOTE — Telephone Encounter (Signed)
Incoming call received from patient. Patient shared that his SNAP benefits have been discontinued and he is not scheduled to receive his monthly check for the next two weeks. Pt recently obtained job with a Charity fundraiser; however, states he needs emergency food assistance, in addition, to money to afford toiletries.   MSW Intern prepared food box for patient and donations were collected to assist patient with requested items. LCSW informed patient of upcoming appointment scheduled for Monday, January 16, 2020. No additional concerns noted.

## 2020-01-16 ENCOUNTER — Other Ambulatory Visit (HOSPITAL_COMMUNITY): Payer: Self-pay

## 2020-01-16 ENCOUNTER — Telehealth: Payer: Self-pay | Admitting: Gastroenterology

## 2020-01-16 ENCOUNTER — Encounter: Payer: Self-pay | Admitting: Family Medicine

## 2020-01-16 ENCOUNTER — Other Ambulatory Visit: Payer: Self-pay

## 2020-01-16 ENCOUNTER — Ambulatory Visit: Payer: Medicaid Other | Attending: Family Medicine | Admitting: Family Medicine

## 2020-01-16 VITALS — BP 150/62 | HR 77 | Wt 182.0 lb

## 2020-01-16 DIAGNOSIS — Z1211 Encounter for screening for malignant neoplasm of colon: Secondary | ICD-10-CM | POA: Diagnosis not present

## 2020-01-16 DIAGNOSIS — F1121 Opioid dependence, in remission: Secondary | ICD-10-CM | POA: Insufficient documentation

## 2020-01-16 DIAGNOSIS — Z794 Long term (current) use of insulin: Secondary | ICD-10-CM | POA: Diagnosis not present

## 2020-01-16 DIAGNOSIS — I4892 Unspecified atrial flutter: Secondary | ICD-10-CM | POA: Insufficient documentation

## 2020-01-16 DIAGNOSIS — E1121 Type 2 diabetes mellitus with diabetic nephropathy: Secondary | ICD-10-CM

## 2020-01-16 DIAGNOSIS — I428 Other cardiomyopathies: Secondary | ICD-10-CM | POA: Diagnosis not present

## 2020-01-16 DIAGNOSIS — F1911 Other psychoactive substance abuse, in remission: Secondary | ICD-10-CM | POA: Diagnosis not present

## 2020-01-16 DIAGNOSIS — F329 Major depressive disorder, single episode, unspecified: Secondary | ICD-10-CM | POA: Diagnosis not present

## 2020-01-16 DIAGNOSIS — E1122 Type 2 diabetes mellitus with diabetic chronic kidney disease: Secondary | ICD-10-CM | POA: Insufficient documentation

## 2020-01-16 DIAGNOSIS — G4709 Other insomnia: Secondary | ICD-10-CM

## 2020-01-16 DIAGNOSIS — Z8249 Family history of ischemic heart disease and other diseases of the circulatory system: Secondary | ICD-10-CM | POA: Insufficient documentation

## 2020-01-16 DIAGNOSIS — Z720 Tobacco use: Secondary | ICD-10-CM

## 2020-01-16 DIAGNOSIS — Z79899 Other long term (current) drug therapy: Secondary | ICD-10-CM | POA: Insufficient documentation

## 2020-01-16 DIAGNOSIS — E871 Hypo-osmolality and hyponatremia: Secondary | ICD-10-CM

## 2020-01-16 DIAGNOSIS — K7460 Unspecified cirrhosis of liver: Secondary | ICD-10-CM | POA: Diagnosis not present

## 2020-01-16 DIAGNOSIS — Z888 Allergy status to other drugs, medicaments and biological substances status: Secondary | ICD-10-CM | POA: Diagnosis not present

## 2020-01-16 DIAGNOSIS — Z8546 Personal history of malignant neoplasm of prostate: Secondary | ICD-10-CM | POA: Insufficient documentation

## 2020-01-16 DIAGNOSIS — Z8041 Family history of malignant neoplasm of ovary: Secondary | ICD-10-CM | POA: Insufficient documentation

## 2020-01-16 DIAGNOSIS — E7800 Pure hypercholesterolemia, unspecified: Secondary | ICD-10-CM | POA: Diagnosis not present

## 2020-01-16 DIAGNOSIS — F1021 Alcohol dependence, in remission: Secondary | ICD-10-CM | POA: Insufficient documentation

## 2020-01-16 DIAGNOSIS — E1140 Type 2 diabetes mellitus with diabetic neuropathy, unspecified: Secondary | ICD-10-CM | POA: Insufficient documentation

## 2020-01-16 DIAGNOSIS — F1421 Cocaine dependence, in remission: Secondary | ICD-10-CM | POA: Insufficient documentation

## 2020-01-16 DIAGNOSIS — F419 Anxiety disorder, unspecified: Secondary | ICD-10-CM | POA: Diagnosis not present

## 2020-01-16 DIAGNOSIS — I110 Hypertensive heart disease with heart failure: Secondary | ICD-10-CM | POA: Insufficient documentation

## 2020-01-16 DIAGNOSIS — N1831 Chronic kidney disease, stage 3a: Secondary | ICD-10-CM | POA: Diagnosis not present

## 2020-01-16 DIAGNOSIS — I482 Chronic atrial fibrillation, unspecified: Secondary | ICD-10-CM | POA: Insufficient documentation

## 2020-01-16 DIAGNOSIS — Z803 Family history of malignant neoplasm of breast: Secondary | ICD-10-CM | POA: Insufficient documentation

## 2020-01-16 DIAGNOSIS — I5042 Chronic combined systolic (congestive) and diastolic (congestive) heart failure: Secondary | ICD-10-CM | POA: Insufficient documentation

## 2020-01-16 DIAGNOSIS — E1159 Type 2 diabetes mellitus with other circulatory complications: Secondary | ICD-10-CM | POA: Diagnosis not present

## 2020-01-16 DIAGNOSIS — Z7901 Long term (current) use of anticoagulants: Secondary | ICD-10-CM | POA: Insufficient documentation

## 2020-01-16 LAB — GLUCOSE, POCT (MANUAL RESULT ENTRY): POC Glucose: 127 mg/dl — AB (ref 70–99)

## 2020-01-16 LAB — POCT GLYCOSYLATED HEMOGLOBIN (HGB A1C): HbA1c, POC (controlled diabetic range): 6.3 % (ref 0.0–7.0)

## 2020-01-16 MED ORDER — ZOLPIDEM TARTRATE 5 MG PO TABS: 5.0000 mg | ORAL_TABLET | Freq: Every evening | ORAL | 1 refills | Status: DC | PRN

## 2020-01-16 NOTE — Progress Notes (Signed)
Paramedicine Encounter    Patient ID: Brad [name], male    DOB: [birth year], 59 y.o.   MRN: [id number]   Patient Care Team: Charlott Rakes, MD as PCP - General (Family Medicine) Debara Pickett Nadean Corwin, MD as PCP - Cardiology (Cardiology) Charlott Rakes, MD (Family Medicine)  Patient Active Problem List   Diagnosis Date Noted  . Chronic systolic CHF (congestive heart failure) ([place]) 10/31/2019  . Acute systolic CHF (congestive heart failure) ([place]) 08/02/2019  . Diarrhea 07/29/2019  . Gastroenteritis 07/22/2019  . Hypomagnesemia 06/19/2019  . Chest pain 06/07/2019  . Elevated troponin 05/23/2019  . Tobacco abuse 05/23/2019  . Alcohol abuse 02/21/2019  . Frequent falls 01/17/2019  . Severe mitral regurgitation   . Fall 11/01/2018  . Hyponatremia 10/31/2018  . Chronic atrial fibrillation   . Chronic hyponatremia 08/16/2018  . NSVT (nonsustained ventricular tachycardia) ([place])   . Concussion with loss of consciousness   . Scalp laceration   . Trauma   . Permanent atrial fibrillation   . Hypertensive heart disease   . Shortness of breath   . Pyogenic inflammation of bone ([place])   . Cirrhosis ([place]) 03/23/2018  . Right ankle pain 03/23/2018  . Syncope 08/07/2017  . Acute on chronic combined systolic and diastolic CHF (congestive heart failure) ([place]) 07/09/2017  . Atrial flutter ([place]) 07/09/2017  . Pre-syncope 07/08/2017  . Neuropathy 05/08/2017  . Substance induced mood disorder ([place]) 10/06/2016  . CVA (cerebral vascular accident) ([place]) 09/18/2016  . Left sided numbness   . Homelessness 08/21/2016  . S/P ORIF (open reduction internal fixation) fracture 08/01/2016  . CAD (coronary artery disease), native coronary artery 07/30/2016  . Surgery, elective   . Insomnia 07/22/2016  . NSTEMI (non-ST elevated myocardial infarction) ([place])   . Anemia 07/05/2016  . Thrombocytopenia ([place]) 07/05/2016  . Cocaine abuse ([place]) 07/02/2016  . Chest pain on breathing 07/01/2016  . Essential  hypertension 07/01/2016  . DM type 2 (diabetes mellitus, type 2) ([place]) 07/01/2016  . Hypokalemia 07/01/2016  . CKD (chronic kidney disease) stage 3, GFR 30-59 ml/min 07/01/2016  . Painful diabetic neuropathy ([place]) 07/01/2016  . Polysubstance abuse ([place]) 05/27/2016  . Chronic hepatitis C with cirrhosis ([place]) 05/27/2016  . Chronic diastolic congestive heart failure ([place]) 01/31/2016  . Depression 04/21/2012  . GERD (gastroesophageal reflux disease) 02/16/2012  . History of drug abuse ([place])   . Heroin addiction ([place]) 01/29/2012    Current Outpatient Medications:  .  amiodarone (PACERONE) 200 MG tablet, Take 200 mg by mouth daily., Disp: , Rfl:  .  apixaban (ELIQUIS) 5 MG TABS tablet, Take 1 tablet (5 mg total) by mouth 2 (two) times daily., Disp: 60 tablet, Rfl: 6 .  atorvastatin (LIPITOR) 20 MG tablet, Take 1 tablet (20 mg total) by mouth daily at 6 PM., Disp: 30 tablet, Rfl: 1 .  digoxin (LANOXIN) 0.125 MG tablet, Take 1 tablet (0.125 mg total) by mouth daily., Disp: 30 tablet, Rfl: 11 .  DULoxetine (CYMBALTA) 60 MG capsule, Take 1 capsule (60 mg total) by mouth daily., Disp: 30 capsule, Rfl: 6 .  gabapentin (NEURONTIN) 300 MG capsule, Take 2 capsules (600 mg total) by mouth 2 (two) times daily., Disp: 120 capsule, Rfl: 3 .  isosorbide-hydrALAZINE (BIDIL) 20-37.5 MG tablet, Take 1 tablet by mouth 3 (three) times daily., Disp: 90 tablet, Rfl: 11 .  potassium chloride SA (KLOR-CON) 20 MEQ tablet, Take 40 mEq by mouth daily., Disp: , Rfl:  .  spironolactone (ALDACTONE) 25 MG tablet, Take 1 tablet (  25 mg total) by mouth daily., Disp: 30 tablet, Rfl: 11 .  torsemide (DEMADEX) 20 MG tablet, Take 20 mg by mouth daily. Take 4 tabs (80mg ) every morning and 3 tabs (60mg ) every evening, Disp: , Rfl:  .  albuterol (VENTOLIN HFA) 108 (90 Base) MCG/ACT inhaler, Inhale 2 puffs into the lungs every 6 (six) hours as needed for shortness of breath. , Disp: , Rfl:  .  folic acid (FOLVITE) 1 MG tablet, Take 1  tablet (1 mg total) by mouth daily. (Patient not taking: Reported on 01/16/2020), Disp: 30 tablet, Rfl: 1 .  Insulin Glargine (LANTUS SOLOSTAR) 100 UNIT/ML Solostar Pen, Inject 3 Units into the skin daily. (Patient not taking: Reported on 01/16/2020), Disp: 3 pen, Rfl: 3 .  mirtazapine (REMERON) 30 MG tablet, Take 30 mg by mouth at bedtime., Disp: , Rfl:  .  thiamine 100 MG tablet, Take 1 tablet (100 mg total) by mouth daily. (Patient not taking: Reported on 01/16/2020), Disp: 30 tablet, Rfl: 1 .  zolpidem (AMBIEN) 5 MG tablet, Take 1 tablet (5 mg total) by mouth at bedtime as needed for sleep., Disp: 30 tablet, Rfl: 1 Allergies  Allergen Reactions  . Angiotensin Receptor Blockers Anaphylaxis and Other (See Comments)    (Angioedema also with Lisinopril, therefore ARB's are contraindicated)  . Lisinopril Anaphylaxis and Other (See Comments)    Throat swells  . Pamelor [Nortriptyline Hcl] Anaphylaxis and Swelling    Throat swells     Social History   Socioeconomic History  . Marital status: Married    Spouse name: Not on file  . Number of children: 3  . Years of education: 2y college  . Highest education level: Not on file  Occupational History  . 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 Smoking status: Current Every Day Smoker    Packs/day: 0.50    Years: 28.00    Pack years: 14.00    Types: Cigarettes  . Smokeless tobacco: Never Used  Substance and Sexual Activity  . Alcohol use: Yes    Alcohol/week: 1.0 standard drinks    Types: 1 Cans of beer per week    Comment: "I drink ~ 40oz beer/day"  . Drug use: Yes    Types: IV, Cocaine, Heroin    Comment: 08/17/2018 "no heroin since 2013; I use cocaine ~ once/month, last use 03/21/2019  . Sexual activity: Not Currently  Other Topics Concern  . Not on file  Social History Narrative   ** Merged History Encounter **       Incarcerated from 2006-2010, then 10/2011-12/2011.  Has been trying to  get sober (no heroin, alcohol since 10/2011).    Social Determinants of Health   Financial Resource Strain:   . Difficulty of Paying Living Expenses: Not on file  Food Insecurity:   . Worried About Charity fundraiser in the Last Year: Not on file  . Ran Out of Food in the Last Year: Not on file  Transportation Needs:   . Lack of Transportation (Medical): Not on file  . Lack of Transportation (Non-Medical): Not on file  Physical Activity:   . Days of Exercise per Week: Not on file  . Minutes of Exercise per Session: Not on file  Stress:   . Feeling of Stress : Not on file  Social Connections:   . Frequency of Communication with Friends and Family: Not on file  . Frequency of Social Gatherings with  Friends and Family: Not on file  . Attends Religious Services: Not on file  . Active Member of Clubs or Organizations: Not on file  . Attends Archivist Meetings: Not on file  . Marital Status: Not on file  Intimate Partner Violence:   . Fear of Current or Ex-Partner: Not on file  . Emotionally Abused: Not on file  . Physically Abused: Not on file  . Sexually Abused: Not on file    Physical Exam Vitals reviewed.  Constitutional:      Appearance: He is normal weight.  HENT:     Head: Normocephalic.     Nose: Nose normal.     Mouth/Throat:     Mouth: Mucous membranes are moist.  Eyes:     Pupils: Pupils are equal, round, and reactive to light.  Cardiovascular:     Rate and Rhythm: Normal rate. Rhythm irregular.     Pulses: Normal pulses.     Heart sounds: Normal heart sounds.  Pulmonary:     Effort: Pulmonary effort is normal.     Breath sounds: Normal breath sounds.  Abdominal:     General: Abdomen is flat.     Palpations: Abdomen is soft.  Musculoskeletal:        General: Normal range of motion.     Cervical back: Normal range of motion.     Right lower leg: Edema present.     Left lower leg: Edema present.  Skin:    General: Skin is warm and dry.      Capillary Refill: Capillary refill takes less than 2 seconds.  Neurological:     Mental Status: He is alert. Mental status is at baseline.  Psychiatric:        Mood and Affect: Mood normal.    Arrived for clinic visit for Brad [name] who was seated in the clinic chair alert and oriented denying shortness of breath, chest pain. Patient complained of swelling in legs and trouble sleeping. Patient had +2 edema in both lower legs. Patient stated he feels like he is retaining fluid. Lung sounds were clear. Patient denied shortness of breath. HF clinic contacted and they advised they wanted labs for a BMET and BMP. Lab was able to do same, HF clinic advised they would follow up with patient. Medications were reviewed and confirmed. 2 pill boxes filled for Brad [name]. Patient missing 2nd pill of potassium for both weeks as well as Weds-Sat for PM Gabapentin dose. Patient has medications ready for pick up at Patient was made aware I would be out of office next week and to call me if needed or reach out to [place] at [facility]. Patient understood. Visit complete. Will follow up with HF clinic after labs completed.   Meds to call in: -Atorvastatin  -Arlyce Harman -Torsemide -Bidil -Potassium      Future Appointments  Date Time Provider [place]  02/10/2020  2:10 PM Mansouraty, Telford Nab., MD [facility]  02/13/2020 11:10 AM Charlott Rakes, MD [facility] None  02/21/2020  9:40 AM Larey Dresser, MD [facility] None     ACTION: Home visit completed Next visit planned for 2/8

## 2020-01-16 NOTE — Telephone Encounter (Signed)
Error

## 2020-01-16 NOTE — Patient Instructions (Signed)
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
## 2020-01-17 ENCOUNTER — Encounter: Payer: Self-pay | Admitting: Family Medicine

## 2020-01-17 LAB — BASIC METABOLIC PANEL
BUN/Creatinine Ratio: 19 (ref 10–24)
BUN: 26 mg/dL (ref 8–27)
CO2: 24 mmol/L (ref 20–29)
Calcium: 8.6 mg/dL (ref 8.6–10.2)
Chloride: 90 mmol/L — ABNORMAL LOW (ref 96–106)
Creatinine, Ser: 1.36 mg/dL — ABNORMAL HIGH (ref 0.76–1.27)
GFR calc Af Amer: 65 mL/min/{1.73_m2} (ref >59)
GFR calc non Af Amer: 56 mL/min/{1.73_m2} — ABNORMAL LOW (ref >59)
Glucose: 127 mg/dL — ABNORMAL HIGH (ref 65–99)
Potassium: 4.8 mmol/L (ref 3.5–5.2)
Sodium: 128 mmol/L — ABNORMAL LOW (ref 134–144)

## 2020-01-17 LAB — BRAIN NATRIURETIC PEPTIDE: BNP: 476 pg/mL — ABNORMAL HIGH (ref 0.0–100.0)

## 2020-01-17 LAB — MICROALBUMIN / CREATININE URINE RATIO
Creatinine, Urine: 10.8 mg/dL
Microalb/Creat Ratio: 669 mg/g creat — ABNORMAL HIGH (ref 0–29)
Microalbumin, Urine: 72.3 ug/mL

## 2020-01-18 ENCOUNTER — Telehealth: Payer: Self-pay

## 2020-01-18 ENCOUNTER — Telehealth (HOSPITAL_COMMUNITY): Payer: Self-pay

## 2020-01-18 NOTE — Telephone Encounter (Signed)
[facility] to [facility], spoke to [facility] and informed her that that patient had labs done at [facility] on 01/16/2020 and the results were faxed to their clinic today.  She said that she would pull the results from Epic and forward to the provider.  She noted that the attempt to contact the patient today about lab results were regarding labs done 01/10/2020.

## 2020-01-18 NOTE — Telephone Encounter (Signed)
Called pt to relay lab results and to make appt for pt. Left voicemail.

## 2020-01-18 NOTE — Telephone Encounter (Signed)
-----   Message from Consuelo Pandy, [place] sent at 01/11/2020  2:52 PM EST ----- Fluid marker is high. Increase torsemide to 80 mg bid. Increase Kdur to 60 mEq daily. Repeat BMP and BNP in 1 week. Also notify paramedicine of med change.

## 2020-01-19 ENCOUNTER — Telehealth: Payer: Self-pay

## 2020-01-19 NOTE — Telephone Encounter (Signed)
Attempted to contact the patient # [phone] to informed him of the lab results from Dr Margarita Rana. Urine shows presence of protein which is as a result of his diabetes. Kidney function has improved slightly and sodium is low but stable. He does have some evidence of fluid overload.   The results have been shared with cardiology.  Message left with call back requested to this CM

## 2020-01-26 ENCOUNTER — Telehealth: Payer: Self-pay

## 2020-01-26 NOTE — Telephone Encounter (Signed)
Call received from the patient, shared lab results  Urine shows presence of protein which is as a result of his diabetes. Kidney function has improved slightly and sodium is low but stable. He does have some evidence of fluid overload. Informed him that cardiology will be contacting him about the results pertaining to his cardiac medications.  He said that he has a message from cardiology and but hasn't called them back. This CM encouraged him to call

## 2020-01-30 ENCOUNTER — Other Ambulatory Visit (HOSPITAL_COMMUNITY): Payer: Self-pay | Admitting: Cardiology

## 2020-01-30 ENCOUNTER — Telehealth (HOSPITAL_COMMUNITY): Payer: Self-pay

## 2020-01-30 ENCOUNTER — Other Ambulatory Visit (HOSPITAL_COMMUNITY): Payer: Self-pay
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
## 2020-01-30 NOTE — Telephone Encounter (Signed)
Left message for Brad [name] to return my call in reference to home visit today. Will await phone call return.

## 2020-01-31 ENCOUNTER — Telehealth: Payer: Self-pay

## 2020-01-31 NOTE — Telephone Encounter (Signed)
Call received from the patient. He said that he went to [facility] today and his medication for depression was adjusted. He that he was diagnosed with "moderate " illness.   Informed him that he may not qualify for the [facility] program but a referral can be submitted. His next appointment with [facility] is 03/07/2020.  A referral for [facility] will be submitted closer to his next appt date as the referral is only good for 30 days.

## 2020-02-02 ENCOUNTER — Telehealth (HOSPITAL_COMMUNITY): Payer: Self-pay

## 2020-02-02 NOTE — Telephone Encounter (Signed)
Brad [name] called to review some medications he received at the pharmacy today, I confirmed same are already in his pill box. Brad [name] reports he is doing well and has no complaints today. I told him I would follow up on Monday. Call complete.

## 2020-02-02 NOTE — Telephone Encounter (Signed)
Pt insurance is active and benefits verified through Medicaid Co-pay 0, DED 0/0 met, out of pocket 0/0 met, co-insurance 0. no pre-authorization required. Passport, 02/02/2020'@11' :10am, REF# [id number]  Will contact patient to see if he is interested in the Cardiac Rehab Program. If interested, patient will need to complete follow up appt. Once completed, patient will be contacted for scheduling upon review by the RN Navigator.

## 2020-02-03 ENCOUNTER — Telehealth (HOSPITAL_COMMUNITY): Payer: Self-pay

## 2020-02-03 ENCOUNTER — Other Ambulatory Visit: Payer: Self-pay | Admitting: Family Medicine

## 2020-02-03 DIAGNOSIS — E118 Type 2 diabetes mellitus with unspecified complications: Secondary | ICD-10-CM

## 2020-02-03 NOTE — Telephone Encounter (Signed)
Called pt regarding cardiac rehab phase II reopening. Pt declined participating in cardiac rehab in person and virtually. Will close referral.   Carma Lair MS, ACSM CEP 1:44 PM 02/03/2020

## 2020-02-04 ENCOUNTER — Other Ambulatory Visit: Payer: Self-pay

## 2020-02-04 ENCOUNTER — Emergency Department (HOSPITAL_COMMUNITY)
Admission: EM | Admit: 2020-02-04 | Discharge: 2020-02-04 | Disposition: A | Discharge status: Home / Self Care | Payer: Medicaid Other | Attending: Emergency Medicine | Admitting: Emergency Medicine

## 2020-02-04 ENCOUNTER — Emergency Department (HOSPITAL_COMMUNITY): Payer: Medicaid Other

## 2020-02-04 ENCOUNTER — Encounter (HOSPITAL_COMMUNITY): Payer: Self-pay

## 2020-02-04 DIAGNOSIS — I5042 Chronic combined systolic (congestive) and diastolic (congestive) heart failure: Secondary | ICD-10-CM | POA: Diagnosis not present

## 2020-02-04 DIAGNOSIS — Z79899 Other long term (current) drug therapy: Secondary | ICD-10-CM | POA: Diagnosis not present

## 2020-02-04 DIAGNOSIS — Y9301 Activity, walking, marching and hiking: Secondary | ICD-10-CM | POA: Insufficient documentation

## 2020-02-04 DIAGNOSIS — Z7901 Long term (current) use of anticoagulants: Secondary | ICD-10-CM | POA: Diagnosis not present

## 2020-02-04 DIAGNOSIS — Y999 Unspecified external cause status: Secondary | ICD-10-CM | POA: Diagnosis not present

## 2020-02-04 DIAGNOSIS — W101XXA Fall (on)(from) sidewalk curb, initial encounter: Secondary | ICD-10-CM | POA: Diagnosis not present

## 2020-02-04 DIAGNOSIS — R519 Headache, unspecified: Secondary | ICD-10-CM | POA: Diagnosis not present

## 2020-02-04 DIAGNOSIS — W19XXXA Unspecified fall, initial encounter: Secondary | ICD-10-CM

## 2020-02-04 DIAGNOSIS — Z8546 Personal history of malignant neoplasm of prostate: Secondary | ICD-10-CM | POA: Insufficient documentation

## 2020-02-04 DIAGNOSIS — I130 Hypertensive heart and chronic kidney disease with heart failure and stage 1 through stage 4 chronic kidney disease, or unspecified chronic kidney disease: Secondary | ICD-10-CM | POA: Diagnosis not present

## 2020-02-04 DIAGNOSIS — F17210 Nicotine dependence, cigarettes, uncomplicated: Secondary | ICD-10-CM | POA: Diagnosis not present

## 2020-02-04 DIAGNOSIS — S0101XA Laceration without foreign body of scalp, initial encounter: Secondary | ICD-10-CM

## 2020-02-04 DIAGNOSIS — N183 Chronic kidney disease, stage 3 unspecified: Secondary | ICD-10-CM | POA: Diagnosis not present

## 2020-02-04 DIAGNOSIS — Y92009 Unspecified place in unspecified non-institutional (private) residence as the place of occurrence of the external cause: Secondary | ICD-10-CM

## 2020-02-04 DIAGNOSIS — I4892 Unspecified atrial flutter: Secondary | ICD-10-CM | POA: Diagnosis not present

## 2020-02-04 DIAGNOSIS — Y92480 Sidewalk as the place of occurrence of the external cause: Secondary | ICD-10-CM | POA: Insufficient documentation

## 2020-02-04 MED ORDER — LIDOCAINE-EPINEPHRINE (PF) 2 %-1:200000 IJ SOLN
10.0000 mL | Freq: Once | INTRAMUSCULAR | Status: DC
  Filled 2020-02-04: qty 20

## 2020-02-04 MED ORDER — OXYCODONE HCL 5 MG PO TABS: 5.0000 mg | ORAL_TABLET | ORAL | 0 refills | Status: DC | PRN

## 2020-02-04 MED ORDER — MORPHINE SULFATE (PF) 4 MG/ML IV SOLN
4.0000 mg | Freq: Once | INTRAVENOUS | Status: AC
  Administered 2020-02-04: 4 mg via INTRAVENOUS
  Filled 2020-02-04: qty 1

## 2020-02-04 NOTE — ED Notes (Signed)
Patient verbalizes understanding of discharge instructions. Opportunity for questioning and answers were provided. Armband removed by staff, pt discharged from ED. Wheeled out to lobby  

## 2020-02-04 NOTE — Discharge Instructions (Signed)
Take acetaminophen as needed for less severe pain.  Because you are taking a blood thinner, he have to watch for delayed bleeding which can occur up to 24-48 hours after the nutritional fall.  If you develop any new neurologic symptoms, come to the emergency department for repeat CT scan.
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
## 2020-02-04 NOTE — ED Triage Notes (Addendum)
Pt bib [facility] from after pt tripped over curb and hit back of head on concrete. ETOH on board per EMS. Pt denies LOC. Pt takes eliquis. AOx4. Approximately 1" lac to back of head with 10/10 pain. C-collar stabilization in place on arrival.

## 2020-02-06 ENCOUNTER — Other Ambulatory Visit (HOSPITAL_COMMUNITY): Payer: Self-pay

## 2020-02-06 ENCOUNTER — Telehealth (HOSPITAL_COMMUNITY): Payer: Self-pay

## 2020-02-06 NOTE — Telephone Encounter (Signed)
Spoke to [place] and made him aware of Medicaid transportation office will be calling him tomorrow for a telephone assessment for transportation needs. He verbalized understanding.

## 2020-02-06 NOTE — Progress Notes (Signed)
Paramedicine Encounter    Patient ID: Brad [name], male    DOB: [birth year], 59 y.o.   MRN: [id number]   Patient Care Team: Charlott Rakes, MD as PCP - General (Family Medicine) Debara Pickett Nadean Corwin, MD as PCP - Cardiology (Cardiology) Charlott Rakes, MD (Family Medicine)  Patient Active Problem List   Diagnosis Date Noted  . Chronic systolic CHF (congestive heart failure) ([place]) 10/31/2019  . Acute systolic CHF (congestive heart failure) ([place]) 08/02/2019  . Diarrhea 07/29/2019  . Gastroenteritis 07/22/2019  . Hypomagnesemia 06/19/2019  . Chest pain 06/07/2019  . Elevated troponin 05/23/2019  . Tobacco abuse 05/23/2019  . Alcohol abuse 02/21/2019  . Frequent falls 01/17/2019  . Severe mitral regurgitation   . Fall 11/01/2018  . Hyponatremia 10/31/2018  . Chronic atrial fibrillation   . Chronic hyponatremia 08/16/2018  . NSVT (nonsustained ventricular tachycardia) ([place])   . Concussion with loss of consciousness   . Scalp laceration   . Trauma   . Permanent atrial fibrillation   . Hypertensive heart disease   . Shortness of breath   . Pyogenic inflammation of bone ([place])   . Cirrhosis ([place]) 03/23/2018  . Right ankle pain 03/23/2018  . Syncope 08/07/2017  . Acute on chronic combined systolic and diastolic CHF (congestive heart failure) ([place]) 07/09/2017  . Atrial flutter ([place]) 07/09/2017  . Pre-syncope 07/08/2017  . Neuropathy 05/08/2017  . Substance induced mood disorder ([place]) 10/06/2016  . CVA (cerebral vascular accident) ([place]) 09/18/2016  . Left sided numbness   . Homelessness 08/21/2016  . S/P ORIF (open reduction internal fixation) fracture 08/01/2016  . CAD (coronary artery disease), native coronary artery 07/30/2016  . Surgery, elective   . Insomnia 07/22/2016  . NSTEMI (non-ST elevated myocardial infarction) ([place])   . Anemia 07/05/2016  . Thrombocytopenia ([place]) 07/05/2016  . Cocaine abuse ([place]) 07/02/2016  . Chest pain on breathing 07/01/2016  . Essential  hypertension 07/01/2016  . DM type 2 (diabetes mellitus, type 2) ([place]) 07/01/2016  . Hypokalemia 07/01/2016  . CKD (chronic kidney disease) stage 3, GFR 30-59 ml/min 07/01/2016  . Painful diabetic neuropathy ([place]) 07/01/2016  . Polysubstance abuse ([place]) 05/27/2016  . Chronic hepatitis C with cirrhosis ([place]) 05/27/2016  . Chronic diastolic congestive heart failure ([place]) 01/31/2016  . Depression 04/21/2012  . GERD (gastroesophageal reflux disease) 02/16/2012  . History of drug abuse ([place])   . Heroin addiction ([place]) 01/29/2012    Current Outpatient Medications:  .  amiodarone (PACERONE) 200 MG tablet, Take 200 mg by mouth daily., Disp: , Rfl:  .  apixaban (ELIQUIS) 5 MG TABS tablet, Take 1 tablet (5 mg total) by mouth 2 (two) times daily., Disp: 60 tablet, Rfl: 6 .  atorvastatin (LIPITOR) 20 MG tablet, Take 1 tablet (20 mg total) by mouth daily at 6 PM., Disp: 30 tablet, Rfl: 1 .  digoxin (LANOXIN) 0.125 MG tablet, Take 1 tablet (0.125 mg total) by mouth daily., Disp: 30 tablet, Rfl: 11 .  DULoxetine (CYMBALTA) 60 MG capsule, Take 1 capsule (60 mg total) by mouth daily., Disp: 30 capsule, Rfl: 6 .  gabapentin (NEURONTIN) 300 MG capsule, Take 2 capsules (600 mg total) by mouth 2 (two) times daily., Disp: 120 capsule, Rfl: 3 .  isosorbide-hydrALAZINE (BIDIL) 20-37.5 MG tablet, Take 1 tablet by mouth 3 (three) times daily., Disp: 90 tablet, Rfl: 11 .  mirtazapine (REMERON) 30 MG tablet, Take 30 mg by mouth at bedtime., Disp: , Rfl:  .  potassium chloride SA (KLOR-CON) 20 MEQ tablet, Take 40  mEq by mouth daily., Disp: , Rfl:  .  spironolactone (ALDACTONE) 25 MG tablet, Take 1 tablet (25 mg total) by mouth daily., Disp: 30 tablet, Rfl: 11 .  torsemide (DEMADEX) 20 MG tablet, Take 20 mg by mouth daily. Take 4 tabs (80mg ) every morning and 3 tabs (60mg ) every evening, Disp: , Rfl:  .  zolpidem (AMBIEN) 5 MG tablet, Take 1 tablet (5 mg total) by mouth at bedtime as needed for sleep., Disp: 30 tablet,  Rfl: 1 .  ACCU-CHEK AVIVA PLUS test strip, USE AS DIRECTED THREE TIMES DAILY (Patient not taking: Reported on 02/06/2020), Disp: 100 each, Rfl: 12 .  albuterol (VENTOLIN HFA) 108 (90 Base) MCG/ACT inhaler, Inhale 2 puffs into the lungs every 6 (six) hours as needed for shortness of breath. , Disp: , Rfl:  .  folic acid (FOLVITE) 1 MG tablet, Take 1 tablet (1 mg total) by mouth daily. (Patient not taking: Reported on 01/16/2020), Disp: 30 tablet, Rfl: 1 .  Insulin Glargine (LANTUS SOLOSTAR) 100 UNIT/ML Solostar Pen, Inject 3 Units into the skin daily. (Patient not taking: Reported on 01/16/2020), Disp: 3 pen, Rfl: 3 .  oxyCODONE (ROXICODONE) 5 MG immediate release tablet, Take 1 tablet (5 mg total) by mouth every 4 (four) hours as needed for severe pain., Disp: 10 tablet, Rfl: 0 .  thiamine 100 MG tablet, Take 1 tablet (100 mg total) by mouth daily. (Patient not taking: Reported on 01/16/2020), Disp: 30 tablet, Rfl: 1 Allergies  Allergen Reactions  . Angiotensin Receptor Blockers Anaphylaxis and Other (See Comments)    (Angioedema also with Lisinopril, therefore ARB's are contraindicated)  . Lisinopril Anaphylaxis and Other (See Comments)    Throat swells  . Pamelor [Nortriptyline Hcl] Anaphylaxis and Swelling    Throat swells     Social History   Socioeconomic History  . Marital status: Married    Spouse name: Not on file  . Number of children: 3  . Years of education: 2y college  . Highest education level: Not on file  Occupational History  . 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 Smoking status: Current Every Day Smoker    Packs/day: 0.50    Years: 28.00    Pack years: 14.00    Types: Cigarettes  . Smokeless tobacco: Never Used  Substance and Sexual Activity  . Alcohol use: Yes    Alcohol/week: 1.0 standard drinks    Types: 1 Cans of beer per week    Comment: "I drink ~ 40oz beer/day"  . Drug use: Yes    Types: IV, Cocaine, Heroin     Comment: 08/17/2018 "no heroin since 2013; I use cocaine ~ once/month, last use 03/21/2019  . Sexual activity: Not Currently  Other Topics Concern  . Not on file  Social History Narrative   ** Merged History Encounter **       Incarcerated from 2006-2010, then 10/2011-12/2011.  Has been trying to get sober (no heroin, alcohol since 10/2011).    Social Determinants of Health   Financial Resource Strain:   . Difficulty of Paying Living Expenses: Not on file  Food Insecurity:   . Worried About Charity fundraiser in the Last Year: Not on file  . Ran Out of Food in the Last Year: Not on file  Transportation Needs:   . Lack of Transportation (Medical): Not on file  . Lack of Transportation (Non-Medical): Not on file  Physical Activity:   .  Days of Exercise per Week: Not on file  . Minutes of Exercise per Session: Not on file  Stress:   . Feeling of Stress : Not on file  Social Connections:   . Frequency of Communication with Friends and Family: Not on file  . Frequency of Social Gatherings with Friends and Family: Not on file  . Attends Religious Services: Not on file  . Active Member of Clubs or Organizations: Not on file  . Attends Archivist Meetings: Not on file  . Marital Status: Not on file  Intimate Partner Violence:   . Fear of Current or Ex-Partner: Not on file  . Emotionally Abused: Not on file  . Physically Abused: Not on file  . Sexually Abused: Not on file    Physical Exam HENT:     Head: Normocephalic.     Comments: Staples in place in back of head from fall on 2/13. Healing well, no redness, swelling or leaking fluids.     Nose: Nose normal.     Mouth/Throat:     Mouth: Mucous membranes are moist.  Eyes:     Pupils: Pupils are equal, round, and reactive to light.  Cardiovascular:     Rate and Rhythm: Normal rate. Rhythm irregular.     Pulses: Normal pulses.     Heart sounds: Normal heart sounds.  Pulmonary:     Effort: Pulmonary effort is  normal.     Breath sounds: Normal breath sounds.  Abdominal:     General: Abdomen is flat.     Palpations: Abdomen is soft.  Musculoskeletal:        General: Normal range of motion.     Cervical back: Normal range of motion.     Right lower leg: No edema.     Left lower leg: No edema.  Skin:    General: Skin is warm and dry.     Capillary Refill: Capillary refill takes less than 2 seconds.  Neurological:     Mental Status: He is alert. Mental status is at baseline.  Psychiatric:        Mood and Affect: Mood normal.     Arrived for home visit for Aisha who was seated on couch in his apartment, awake alert and oriented in no obvious distress. Dekon reports having a fall on the sidewalk on 2/13 and going to the ED resulting in staples in the back of his head. Cloid stated he was feeling okay but his head was hurting really bad. Juliocesar left his discharge instructions at the ED and RX was attached for pain medication. I contacted [facility] who stated they had Oxycodones ready for pick up. I informed patient of same. Vitals were obtained and as noted. Medications were reviewed and confirmed. Pill box completed. Errik was reminded of appointment at [facility] for Friday at 2:10 and [facility] appointment for Monday at 2:50. [name] advised Carilyn Goodpasture RN reports he can have his staples removed by PCP without issue. Patient made aware of same. I contacted medicaid transportation services for patient for appointments, they advised they would need to complete assessment prior to allowing patient to ride. My contact was left with medicaid agent to call back for assessment. Will continue to follow. Patient made aware of same. Home visit complete.   meds called in for refill: Digoxin Spironalactone      Future Appointments  Date Time Provider [place]  02/10/2020  2:10 PM Mansouraty, Telford Nab., MD [facility]  02/13/2020  2:50  PM Charlott Rakes, MD [facility] None  02/21/2020  9:40  AM Larey Dresser, MD [facility] None     ACTION: Home visit completed Next visit planned for one week

## 2020-02-07 ENCOUNTER — Telehealth: Payer: Self-pay
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
## 2020-02-09 ENCOUNTER — Telehealth (HOSPITAL_COMMUNITY): Payer: Self-pay

## 2020-02-09 NOTE — Telephone Encounter (Signed)
Scheduled SCAT for [place] for 2/19 and 2/22.   2/19 2:10 appointment at [facility] at [phone] Return [phone]  2/22 2:50 appointment at [facility] [id number] Return [phone]    Left message for [place] reminding him of these times.

## 2020-02-10 ENCOUNTER — Other Ambulatory Visit: Payer: Self-pay | Admitting: Family Medicine

## 2020-02-10 ENCOUNTER — Ambulatory Visit: Payer: Medicaid Other | Admitting: Gastroenterology

## 2020-02-13 ENCOUNTER — Encounter: Payer: Self-pay | Admitting: Family Medicine

## 2020-02-13 ENCOUNTER — Ambulatory Visit: Payer: Medicaid Other | Attending: Family Medicine | Admitting: Family Medicine

## 2020-02-13 ENCOUNTER — Ambulatory Visit: Payer: Medicaid Other | Admitting: Family Medicine

## 2020-02-13 ENCOUNTER — Other Ambulatory Visit (HOSPITAL_COMMUNITY): Payer: Self-pay

## 2020-02-13 ENCOUNTER — Other Ambulatory Visit: Payer: Self-pay

## 2020-02-13 VITALS — BP 137/65 | HR 80 | Wt 174.0 lb

## 2020-02-13 DIAGNOSIS — E1159 Type 2 diabetes mellitus with other circulatory complications: Secondary | ICD-10-CM

## 2020-02-13 DIAGNOSIS — Z794 Long term (current) use of insulin: Secondary | ICD-10-CM | POA: Diagnosis not present

## 2020-02-13 DIAGNOSIS — I482 Chronic atrial fibrillation, unspecified: Secondary | ICD-10-CM

## 2020-02-13 DIAGNOSIS — Z4802 Encounter for removal of sutures: Secondary | ICD-10-CM

## 2020-02-13 LAB — GLUCOSE, POCT (MANUAL RESULT ENTRY): POC Glucose: 151 mg/dl — AB (ref 70–99)

## 2020-02-13 NOTE — Progress Notes (Signed)
Seen [name] in clinic today at [facility]. [name] came in reference to removal of staples from his fall with laceration over a week ago. [name] reports feeling "okay" [name] medications were reviewed and confirmed. Few medications missing. Pill box empty. Medications confirmed and pill box filled accordingly. [name] reports he is going to attend his fathers funeral tomorrow in [place] at [place] and needs to reschedule his GI appointment. I contacted office and rescheduled same for April 8th at 1400. Patient agreed with same. [place] agreed to home visit in one week.   [facility]ills to [facility]: Atorvastatin (can be filled now) Digoxin (picked up feb 15th) Amtriptyline (picked up feb 9th) Spironolactone (waiting HF approval) Potassium (will be ready feb 26th) Ambien (can be filled now)
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
## 2020-02-14 ENCOUNTER — Ambulatory Visit: Payer: Medicaid Other | Admitting: Gastroenterology

## 2020-02-14 ENCOUNTER — Other Ambulatory Visit: Payer: Self-pay | Admitting: Family Medicine

## 2020-02-14 DIAGNOSIS — G4709 Other insomnia: Secondary | ICD-10-CM

## 2020-02-20 ENCOUNTER — Telehealth (HOSPITAL_COMMUNITY): Payer: Self-pay

## 2020-02-20 NOTE — Telephone Encounter (Signed)
[place] asked to move appointment to Wednesday. Will follow up with patient then.

## 2020-02-20 NOTE — Telephone Encounter (Signed)
COVID-19 pre-appointment screening questions:  Do you have a history of COVID-19 or a positive test result in the past 7-10 days? NO   To the best of your knowledge, have you been in close contact with anyone with a confirmed diagnosis of COVID 19?NO  Have you had any one or more of the following: Fever, chills, cough, shortness of breath (out of the normal for you) or any flu-like symptoms?NO  Are you experiencing any of the following symptoms that is new or out of usual for you:NO   Ear, nose or throat discomfort  Sore throat  Headache  Muscle Pain  Diarrhea  Loss of taste or smell   Reviewed all the following with patient: REVIEWED  Use of hand sanitizer when entering the building  Everyone is required to wear a mask in the building, if you do not have a mask we are happy to provide you with one when you arrive  NO Visitor guidelines   If patient answers YES to any of questions they must change to a virtual visit and place note in comments about symptoms 

## 2020-02-21 ENCOUNTER — Emergency Department (HOSPITAL_COMMUNITY)
Admission: EM | Admit: 2020-02-21 | Discharge: 2020-02-21 | Disposition: A | Discharge status: Home / Self Care | Payer: Medicaid Other | Attending: Emergency Medicine | Admitting: Emergency Medicine

## 2020-02-21 ENCOUNTER — Other Ambulatory Visit (HOSPITAL_COMMUNITY): Payer: Self-pay

## 2020-02-21 ENCOUNTER — Encounter (HOSPITAL_COMMUNITY): Payer: Medicaid Other | Admitting: Cardiology

## 2020-02-21 ENCOUNTER — Encounter (HOSPITAL_COMMUNITY): Payer: Self-pay | Admitting: Emergency Medicine

## 2020-02-21 ENCOUNTER — Other Ambulatory Visit: Payer: Self-pay

## 2020-02-21 DIAGNOSIS — R1084 Generalized abdominal pain: Secondary | ICD-10-CM | POA: Diagnosis not present

## 2020-02-21 DIAGNOSIS — I5022 Chronic systolic (congestive) heart failure: Secondary | ICD-10-CM | POA: Diagnosis not present

## 2020-02-21 DIAGNOSIS — N183 Chronic kidney disease, stage 3 unspecified: Secondary | ICD-10-CM | POA: Insufficient documentation

## 2020-02-21 DIAGNOSIS — R109 Unspecified abdominal pain: Secondary | ICD-10-CM | POA: Diagnosis present

## 2020-02-21 DIAGNOSIS — I252 Old myocardial infarction: Secondary | ICD-10-CM | POA: Insufficient documentation

## 2020-02-21 DIAGNOSIS — I2510 Atherosclerotic heart disease of native coronary artery without angina pectoris: Secondary | ICD-10-CM | POA: Diagnosis not present

## 2020-02-21 DIAGNOSIS — D72819 Decreased white blood cell count, unspecified: Secondary | ICD-10-CM | POA: Diagnosis not present

## 2020-02-21 DIAGNOSIS — Z79899 Other long term (current) drug therapy: Secondary | ICD-10-CM | POA: Insufficient documentation

## 2020-02-21 DIAGNOSIS — F17210 Nicotine dependence, cigarettes, uncomplicated: Secondary | ICD-10-CM | POA: Diagnosis not present

## 2020-02-21 DIAGNOSIS — E1122 Type 2 diabetes mellitus with diabetic chronic kidney disease: Secondary | ICD-10-CM | POA: Insufficient documentation

## 2020-02-21 DIAGNOSIS — Z794 Long term (current) use of insulin: Secondary | ICD-10-CM | POA: Diagnosis not present

## 2020-02-21 DIAGNOSIS — Z7901 Long term (current) use of anticoagulants: Secondary | ICD-10-CM | POA: Diagnosis not present

## 2020-02-21 DIAGNOSIS — I130 Hypertensive heart and chronic kidney disease with heart failure and stage 1 through stage 4 chronic kidney disease, or unspecified chronic kidney disease: Secondary | ICD-10-CM | POA: Insufficient documentation

## 2020-02-21 DIAGNOSIS — Z96652 Presence of left artificial knee joint: Secondary | ICD-10-CM | POA: Insufficient documentation

## 2020-02-21 LAB — LACTIC ACID, PLASMA: Lactic Acid, Venous: 1.8 mmol/L (ref 0.5–1.9)

## 2020-02-21 LAB — URINALYSIS, ROUTINE W REFLEX MICROSCOPIC
Bacteria, UA: NONE SEEN
Bilirubin Urine: NEGATIVE
Glucose, UA: NEGATIVE mg/dL
Ketones, ur: NEGATIVE mg/dL
Leukocytes,Ua: NEGATIVE
Nitrite: NEGATIVE
Protein, ur: 100 mg/dL — AB
Specific Gravity, Urine: 1.005 (ref 1.005–1.030)
pH: 6 (ref 5.0–8.0)

## 2020-02-21 LAB — COMPREHENSIVE METABOLIC PANEL
ALT: 46 U/L — ABNORMAL HIGH (ref 0–44)
AST: 90 U/L — ABNORMAL HIGH (ref 15–41)
Albumin: 3.8 g/dL (ref 3.5–5.0)
Alkaline Phosphatase: 75 U/L (ref 38–126)
Anion gap: 13 (ref 5–15)
BUN: 18 mg/dL (ref 6–20)
CO2: 25 mmol/L (ref 22–32)
Calcium: 8.9 mg/dL (ref 8.9–10.3)
Chloride: 89 mmol/L — ABNORMAL LOW (ref 98–111)
Creatinine, Ser: 1.56 mg/dL — ABNORMAL HIGH (ref 0.61–1.24)
GFR calc Af Amer: 55 mL/min — ABNORMAL LOW (ref >60)
GFR calc non Af Amer: 48 mL/min — ABNORMAL LOW (ref >60)
Glucose, Bld: 110 mg/dL — ABNORMAL HIGH (ref 70–99)
Potassium: 4.1 mmol/L (ref 3.5–5.1)
Sodium: 127 mmol/L — ABNORMAL LOW (ref 135–145)
Total Bilirubin: 0.8 mg/dL (ref 0.3–1.2)
Total Protein: 8.3 g/dL — ABNORMAL HIGH (ref 6.5–8.1)

## 2020-02-21 LAB — CBC WITH DIFFERENTIAL/PLATELET
Abs Immature Granulocytes: 0 10*3/uL (ref 0.00–0.07)
Basophils Absolute: 0 10*3/uL (ref 0.0–0.1)
Basophils Relative: 1 %
Eosinophils Absolute: 0.1 10*3/uL (ref 0.0–0.5)
Eosinophils Relative: 6 %
HCT: 38.8 % — ABNORMAL LOW (ref 39.0–52.0)
Hemoglobin: 13.5 g/dL (ref 13.0–17.0)
Immature Granulocytes: 0 %
Lymphocytes Relative: 23 %
Lymphs Abs: 0.4 10*3/uL — ABNORMAL LOW (ref 0.7–4.0)
MCH: 31.5 pg (ref 26.0–34.0)
MCHC: 34.8 g/dL (ref 30.0–36.0)
MCV: 90.4 fL (ref 80.0–100.0)
Monocytes Absolute: 0.2 10*3/uL (ref 0.1–1.0)
Monocytes Relative: 14 %
Neutro Abs: 0.9 10*3/uL — ABNORMAL LOW (ref 1.7–7.7)
Neutrophils Relative %: 56 %
Platelets: 206 10*3/uL (ref 150–400)
RBC: 4.29 MIL/uL (ref 4.22–5.81)
RDW: 13.2 % (ref 11.5–15.5)
WBC: 1.6 10*3/uL — ABNORMAL LOW (ref 4.0–10.5)
nRBC: 0 % (ref 0.0–0.2)

## 2020-02-21 LAB — DIGOXIN LEVEL: Digoxin Level: 0.5 ng/mL — ABNORMAL LOW (ref 0.8–2.0)

## 2020-02-21 LAB — LIPASE, BLOOD: Lipase: 25 U/L (ref 11–51)

## 2020-02-21 LAB — PATHOLOGIST SMEAR REVIEW

## 2020-02-21 LAB — HIV ANTIBODY (ROUTINE TESTING W REFLEX): HIV Screen 4th Generation wRfx: NONREACTIVE

## 2020-02-21 MED ORDER — DICYCLOMINE HCL 10 MG PO CAPS
10.0000 mg | ORAL_CAPSULE | Freq: Once | ORAL | Status: AC
  Administered 2020-02-21: 10 mg via ORAL
  Filled 2020-02-21: qty 1

## 2020-02-21 MED ORDER — ALUM & MAG HYDROXIDE-SIMETH 200-200-20 MG/5ML PO SUSP
30.0000 mL | Freq: Once | ORAL | Status: AC
  Administered 2020-02-21: 30 mL via ORAL
  Filled 2020-02-21: qty 30

## 2020-02-21 MED ORDER — METOCLOPRAMIDE HCL 5 MG/ML IJ SOLN
10.0000 mg | Freq: Once | INTRAMUSCULAR | Status: AC
  Administered 2020-02-21: 10 mg via INTRAVENOUS
  Filled 2020-02-21: qty 2

## 2020-02-21 MED ORDER — DICYCLOMINE HCL 20 MG PO TABS: 20.0000 mg | ORAL_TABLET | Freq: Two times a day (BID) | ORAL | 0 refills | Status: DC | PRN

## 2020-02-21 MED ORDER — ONDANSETRON HCL 4 MG PO TABS: 4.0000 mg | ORAL_TABLET | Freq: Three times a day (TID) | ORAL | 0 refills | Status: DC | PRN

## 2020-02-21 MED ORDER — LIDOCAINE VISCOUS HCL 2 % MT SOLN
15.0000 mL | Freq: Once | OROMUCOSAL | Status: AC
  Administered 2020-02-21: 15 mL via ORAL
  Filled 2020-02-21: qty 15
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
## 2020-02-21 NOTE — ED Provider Notes (Signed)
Patient seen/examined in the Emergency Department in conjunction with Advanced Practice Provider  Patient reports abdominal pain and nausea.  Reports he recently tried to quit drinking ETOH Exam : awake/alert, no distress, abd soft/nontender Plan: labs pending at this time No imaging indicated at this time    Ripley Fraise, MD 02/21/20 [phone]

## 2020-02-21 NOTE — ED Notes (Signed)
Patient tolerating fluids and crackers with no difficulty

## 2020-02-21 NOTE — ED Provider Notes (Addendum)
  Physical Exam  BP (!) 164/73   Pulse 67   Temp 98.1 F (36.7 C) (Oral)   Resp 19   Ht 5\' 11"  (1.803 m)   Wt 77.1 kg   SpO2 96%   BMI 23.71 kg/m   Physical Exam  ED Course/Procedures   Clinical Course as of Feb 21 720  Tue Feb 21, 2020  0720 Lactic Acid, Venous: 1.8 [JS]    Clinical Course User Index [JS] Janeece Fitting, PA-C    Procedures  MDM  Patient care assumed from Phia C. PA at shift change, please see her note for a full HPI. Briefly, patient here with persistent abdominal pain along with nausea, no vomiting episodes.  He is trying to wean himself off alcohol, history of withdrawal seizures, or hospitalization.  Has taken ibuprofen along with Tylenol for his symptoms.  According to EDP she is tolerating p.o., well-appearing, plan is for lactic acid level, if this is negative patient will likely be discharged home.  7:21 AM lactic acid is 1.8, will have patient follow-up on an outpatient basis.  Vitals are within normal limits, patient is stable for discharge.   Portions of this note were generated with Lobbyist. Dictation errors may occur despite best attempts at proofreading.         Janeece Fitting, PA-C 02/21/20 [id number]    Ripley Fraise, MD 02/21/20 0703    Janeece Fitting, PA-C 02/21/20 [id number]    Ripley Fraise, MD 02/21/20 2312

## 2020-02-21 NOTE — Progress Notes (Signed)
Met with Brad [name] for a med recognition today. Medications confirmed and reviewed. Pill box filled accordingly. Patient reported him being evaluated in the ED for alcohol detox and abdominal pain. Patient was educated on mediations prescribed and the goals moving forward to decrease alcohol use. Patient verbalized understanding. Will see patient in one week. Visit complete.   Refills- Amitriptyline  Mirtpizine

## 2020-02-21 NOTE — ED Triage Notes (Signed)
Patient here from home with complaints of abd pain and nausea from alcohol. Reports that he is trying to detox self, last drink was early evening yesturday.

## 2020-02-21 NOTE — Discharge Instructions (Signed)
Call the blood doctor listed below to set up an appointment for further evaluation of your blood work.  Take zofran as needed for nausea or vomiting.  Use bentyl as needed for stomach pain. Make sure you are staying well hydrated with water.  Return to the ER if you develop fevers, severe worsening pain, persistent vomiting, severe weakness, confusion, or any new, worsening, or concerning symptoms.

## 2020-02-22 ENCOUNTER — Encounter (HOSPITAL_COMMUNITY): Payer: Self-pay

## 2020-02-22 ENCOUNTER — Inpatient Hospital Stay (HOSPITAL_COMMUNITY)
Admission: EM | Admit: 2020-02-22 | Discharge: 2020-02-24 | DRG: 292 | Disposition: A | Discharge status: Home / Self Care | Payer: Medicaid Other | Attending: Internal Medicine | Admitting: Internal Medicine

## 2020-02-22 ENCOUNTER — Emergency Department (HOSPITAL_COMMUNITY): Payer: Medicaid Other

## 2020-02-22 DIAGNOSIS — E1140 Type 2 diabetes mellitus with diabetic neuropathy, unspecified: Secondary | ICD-10-CM | POA: Diagnosis present

## 2020-02-22 DIAGNOSIS — K7460 Unspecified cirrhosis of liver: Secondary | ICD-10-CM | POA: Diagnosis present

## 2020-02-22 DIAGNOSIS — K219 Gastro-esophageal reflux disease without esophagitis: Secondary | ICD-10-CM | POA: Diagnosis present

## 2020-02-22 DIAGNOSIS — Z809 Family history of malignant neoplasm, unspecified: Secondary | ICD-10-CM

## 2020-02-22 DIAGNOSIS — I482 Chronic atrial fibrillation, unspecified: Secondary | ICD-10-CM | POA: Diagnosis present

## 2020-02-22 DIAGNOSIS — W19XXXA Unspecified fall, initial encounter: Secondary | ICD-10-CM

## 2020-02-22 DIAGNOSIS — F1020 Alcohol dependence, uncomplicated: Secondary | ICD-10-CM | POA: Diagnosis present

## 2020-02-22 DIAGNOSIS — E119 Type 2 diabetes mellitus without complications: Secondary | ICD-10-CM

## 2020-02-22 DIAGNOSIS — S0990XA Unspecified injury of head, initial encounter: Secondary | ICD-10-CM

## 2020-02-22 DIAGNOSIS — E871 Hypo-osmolality and hyponatremia: Secondary | ICD-10-CM | POA: Diagnosis present

## 2020-02-22 DIAGNOSIS — E7800 Pure hypercholesterolemia, unspecified: Secondary | ICD-10-CM | POA: Diagnosis present

## 2020-02-22 DIAGNOSIS — Z8249 Family history of ischemic heart disease and other diseases of the circulatory system: Secondary | ICD-10-CM

## 2020-02-22 DIAGNOSIS — E860 Dehydration: Secondary | ICD-10-CM | POA: Diagnosis present

## 2020-02-22 DIAGNOSIS — I2510 Atherosclerotic heart disease of native coronary artery without angina pectoris: Secondary | ICD-10-CM | POA: Diagnosis present

## 2020-02-22 DIAGNOSIS — R627 Adult failure to thrive: Secondary | ICD-10-CM | POA: Diagnosis present

## 2020-02-22 DIAGNOSIS — B182 Chronic viral hepatitis C: Secondary | ICD-10-CM | POA: Diagnosis present

## 2020-02-22 DIAGNOSIS — Z20822 Contact with and (suspected) exposure to covid-19: Secondary | ICD-10-CM | POA: Diagnosis present

## 2020-02-22 DIAGNOSIS — Z8546 Personal history of malignant neoplasm of prostate: Secondary | ICD-10-CM

## 2020-02-22 DIAGNOSIS — I130 Hypertensive heart and chronic kidney disease with heart failure and stage 1 through stage 4 chronic kidney disease, or unspecified chronic kidney disease: Principal | ICD-10-CM | POA: Diagnosis present

## 2020-02-22 DIAGNOSIS — Z79899 Other long term (current) drug therapy: Secondary | ICD-10-CM

## 2020-02-22 DIAGNOSIS — N1831 Chronic kidney disease, stage 3a: Secondary | ICD-10-CM | POA: Diagnosis present

## 2020-02-22 DIAGNOSIS — I5042 Chronic combined systolic (congestive) and diastolic (congestive) heart failure: Secondary | ICD-10-CM | POA: Diagnosis present

## 2020-02-22 DIAGNOSIS — I4821 Permanent atrial fibrillation: Secondary | ICD-10-CM | POA: Diagnosis present

## 2020-02-22 DIAGNOSIS — F1190 Opioid use, unspecified, uncomplicated: Secondary | ICD-10-CM | POA: Diagnosis present

## 2020-02-22 DIAGNOSIS — F1010 Alcohol abuse, uncomplicated: Secondary | ICD-10-CM | POA: Diagnosis present

## 2020-02-22 DIAGNOSIS — R296 Repeated falls: Secondary | ICD-10-CM | POA: Diagnosis present

## 2020-02-22 DIAGNOSIS — I5022 Chronic systolic (congestive) heart failure: Secondary | ICD-10-CM | POA: Diagnosis present

## 2020-02-22 DIAGNOSIS — Z7901 Long term (current) use of anticoagulants: Secondary | ICD-10-CM

## 2020-02-22 DIAGNOSIS — R55 Syncope and collapse: Secondary | ICD-10-CM | POA: Diagnosis present

## 2020-02-22 DIAGNOSIS — E1122 Type 2 diabetes mellitus with diabetic chronic kidney disease: Secondary | ICD-10-CM | POA: Diagnosis present

## 2020-02-22 DIAGNOSIS — F17210 Nicotine dependence, cigarettes, uncomplicated: Secondary | ICD-10-CM | POA: Diagnosis present

## 2020-02-22 DIAGNOSIS — Z8673 Personal history of transient ischemic attack (TIA), and cerebral infarction without residual deficits: Secondary | ICD-10-CM

## 2020-02-22 DIAGNOSIS — M1990 Unspecified osteoarthritis, unspecified site: Secondary | ICD-10-CM | POA: Diagnosis present

## 2020-02-22 DIAGNOSIS — I5032 Chronic diastolic (congestive) heart failure: Secondary | ICD-10-CM | POA: Diagnosis present

## 2020-02-22 LAB — CBC WITH DIFFERENTIAL/PLATELET
Abs Immature Granulocytes: 0.01 10*3/uL (ref 0.00–0.07)
Basophils Absolute: 0 10*3/uL (ref 0.0–0.1)
Basophils Relative: 1 %
Eosinophils Absolute: 0.1 10*3/uL (ref 0.0–0.5)
Eosinophils Relative: 2 %
HCT: 37.1 % — ABNORMAL LOW (ref 39.0–52.0)
Hemoglobin: 13.2 g/dL (ref 13.0–17.0)
Immature Granulocytes: 0 %
Lymphocytes Relative: 14 %
Lymphs Abs: 0.4 10*3/uL — ABNORMAL LOW (ref 0.7–4.0)
MCH: 31.8 pg (ref 26.0–34.0)
MCHC: 35.6 g/dL (ref 30.0–36.0)
MCV: 89.4 fL (ref 80.0–100.0)
Monocytes Absolute: 0.4 10*3/uL (ref 0.1–1.0)
Monocytes Relative: 15 %
Neutro Abs: 2 10*3/uL (ref 1.7–7.7)
Neutrophils Relative %: 68 %
Platelets: 193 10*3/uL (ref 150–400)
RBC: 4.15 MIL/uL — ABNORMAL LOW (ref 4.22–5.81)
RDW: 12.9 % (ref 11.5–15.5)
WBC: 2.9 10*3/uL — ABNORMAL LOW (ref 4.0–10.5)
nRBC: 0 % (ref 0.0–0.2)

## 2020-02-22 LAB — BASIC METABOLIC PANEL
Anion gap: 14 (ref 5–15)
BUN: 12 mg/dL (ref 6–20)
CO2: 21 mmol/L — ABNORMAL LOW (ref 22–32)
Calcium: 8.3 mg/dL — ABNORMAL LOW (ref 8.9–10.3)
Chloride: 85 mmol/L — ABNORMAL LOW (ref 98–111)
Creatinine, Ser: 1.57 mg/dL — ABNORMAL HIGH (ref 0.61–1.24)
GFR calc Af Amer: 55 mL/min — ABNORMAL LOW (ref >60)
GFR calc non Af Amer: 47 mL/min — ABNORMAL LOW (ref >60)
Glucose, Bld: 159 mg/dL — ABNORMAL HIGH (ref 70–99)
Potassium: 4.3 mmol/L (ref 3.5–5.1)
Sodium: 120 mmol/L — ABNORMAL LOW (ref 135–145)

## 2020-02-22 MED ORDER — SODIUM CHLORIDE 0.9 % IV SOLN: Freq: Once | INTRAVENOUS | Status: AC

## 2020-02-22 MED ORDER — SODIUM CHLORIDE 0.9 % IV BOLUS
500.0000 mL | Freq: Once | INTRAVENOUS | Status: AC
  Administered 2020-02-22: 500 mL via INTRAVENOUS

## 2020-02-22 NOTE — ED Triage Notes (Signed)
He tells me he is here "because I keep drinking and not eating". He is in no distress. Hx a fib.

## 2020-02-22 NOTE — Patient Outreach (Signed)
ED Peer Support Specialist Patient Intake (Complete at intake & 30-60 Day Follow-up)  Name: Brad [name]  MRN: [id number]  Age: 59 y.o.   Date of Admission: 02/22/2020  Intake: Initial Comments:      Primary Reason Admitted: Alcohol Problem   Lab values: Alcohol/ETOH: Positive Positive UDS? No Amphetamines: No Barbiturates: No Benzodiazepines: No Cocaine: No Opiates: No Cannabinoids: No  Demographic information: Gender: Male Ethnicity: African American Marital Status: Separated Insurance Status: Medicaid Ecologist (Work Neurosurgeon, Physicist, medical, etc.: Yes([employer]) Lives with: Friend/Rommate Living situation: House/Apartment  Reported Patient History: Patient reported health conditions: Depression Patient aware of HIV and hepatitis status: Yes (comment)(Hep C)  In past year, has patient visited ED for any reason? Yes  Number of ED visits: 1  Reason(s) for visit: Same situations  In past year, has patient been hospitalized for any reason? No  Number of hospitalizations:    Reason(s) for hospitalization:    In past year, has patient been arrested? No  Number of arrests:    Reason(s) for arrest:    In past year, has patient been incarcerated? No  Number of incarcerations:    Reason(s) for incarceration:    In past year, has patient received medication-assisted treatment? No  In past year, patient received the following treatments:    In past year, has patient received any harm reduction services? No  Did this include any of the following?    In past year, has patient received care from a mental health provider for diagnosis other than SUD? No  In past year, is this first time patient has overdosed? No  Number of past overdoses:    In past year, is this first time patient has been hospitalized for an overdose? No  Number of hospitalizations for overdose(s):    Is patient currently receiving treatment for a mental health  diagnosis? No  Patient reports experiencing difficulty participating in SUD treatment: No    Most important reason(s) for this difficulty?    Has patient received prior services for treatment? No  In past, patient has received services from following agencies:    Plan of Care:  Suggested follow up at these agencies/treatment centers: (Pt stated he will be staying overnight due to other health concerns)  Other information:  [facility]PSS met with Pt and was made aware that he will be staying overnight because of other health concerns. CPSS will follow up with Pt tomorrow to see if he wants seek treatment or other services for Alcohol Abuse.    Aaron Edelman Blaise Palladino, [place]  02/22/2020 11:16 PM

## 2020-02-22 NOTE — ED Notes (Addendum)
Patient was given food and drinks to PO challenge.
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
## 2020-02-22 NOTE — Discharge Instructions (Addendum)
Please cease drinking.  Please follow-up with your primary care doctor for reevaluation.  Please return to the ED if you have any new or concerning symptoms.  Follow with PCP in 1-2 weeks Follow up with Cardiology as scheduled on 03/06/2020  Please get a complete blood count and chemistry panel checked by your Primary MD at your next visit, and again as instructed by your Primary MD. Please get your medications reviewed and adjusted by your Primary MD.  Please request your Primary MD to go over all Hospital Tests and Procedure/Radiological results at the follow up, please get all Hospital records sent to your Prim MD by signing hospital release before you go home.  In some cases, there will be blood work, cultures and biopsy results pending at the time of your discharge. Please request that your primary care M.D. goes through all the records of your hospital data and follows up on these results.  If you had Pneumonia of Lung problems at the Hospital: Please get a 2 view Chest X ray done in 6-8 weeks after hospital discharge or sooner if instructed by your Primary MD.  If you have Congestive Heart Failure: Please call your Cardiologist or Primary MD anytime you have any of the following symptoms:  1) 3 pound weight gain in 24 hours or 5 pounds in 1 week  2) shortness of breath, with or without a dry hacking cough  3) swelling in the hands, feet or stomach  4) if you have to sleep on extra pillows at night in order to breathe  Follow cardiac low salt diet and 1.5 lit/day fluid restriction.  If you have diabetes Accuchecks 4 times/day, Once in AM empty stomach and then before each meal. Log in all results and show them to your primary doctor at your next visit. If any glucose reading is under 80 or above 300 call your primary MD immediately.  If you have Seizure/Convulsions/Epilepsy: Please do not drive, operate heavy machinery, participate in activities at heights or participate in high  speed sports until you have seen by Primary MD or a Neurologist and advised to do so again. Per [facility] statutes, patients with seizures are not allowed to drive until they have been seizure-free for six months.  Use caution when using heavy equipment or power tools. Avoid working on ladders or at heights. Take showers instead of baths. Ensure the water temperature is not too high on the home water heater. Do not go swimming alone. Do not lock yourself in a room alone (i.e. bathroom). When caring for infants or small children, sit down when holding, feeding, or changing them to minimize risk of injury to the child in the event you have a seizure. Maintain good sleep hygiene. Avoid alcohol.   If you had Gastrointestinal Bleeding: Please ask your Primary MD to check a complete blood count within one week of discharge or at your next visit. Your endoscopic/colonoscopic biopsies that are pending at the time of discharge, will also need to followed by your Primary MD.  Get Medicines reviewed and adjusted. Please take all your medications with you for your next visit with your Primary MD  Please request your Primary MD to go over all hospital tests and procedure/radiological results at the follow up, please ask your Primary MD to get all Hospital records sent to his/her office.  If you experience worsening of your admission symptoms, develop shortness of breath, life threatening emergency, suicidal or homicidal thoughts you must seek medical attention  immediately by calling 911 or calling your MD immediately  if symptoms less severe.  You must read complete instructions/literature along with all the possible adverse reactions/side effects for all the Medicines you take and that have been prescribed to you. Take any new Medicines after you have completely understood and accpet all the possible adverse reactions/side effects.   Do not drive or operate heavy machinery when taking Pain medications.     Do not take more than prescribed Pain, Sleep and Anxiety Medications  Special Instructions: If you have smoked or chewed Tobacco  in the last 2 yrs please stop smoking, stop any regular Alcohol  and or any Recreational drug use.  Wear Seat belts while driving.  Please note You were cared for by a hospitalist during your hospital stay. If you have any questions about your discharge medications or the care you received while you were in the hospital after you are discharged, you can call the unit and asked to speak with the hospitalist on call if the hospitalist that took care of you is not available. Once you are discharged, your primary care physician will handle any further medical issues. Please note that NO REFILLS for any discharge medications will be authorized once you are discharged, as it is imperative that you return to your primary care physician (or establish a relationship with a primary care physician if you do not have one) for your aftercare needs so that they can reassess your need for medications and monitor your lab values.  You can reach the hospitalist office at phone [phone] or fax [phone]   If you do not have a primary care physician, you can call [phone] for a physician referral.  Activity: As tolerated with Full fall precautions use walker/cane & assistance as needed    Diet: low sodium  Disposition Home \

## 2020-02-23 ENCOUNTER — Encounter (HOSPITAL_COMMUNITY): Payer: Self-pay | Admitting: Internal Medicine

## 2020-02-23 ENCOUNTER — Other Ambulatory Visit: Payer: Self-pay

## 2020-02-23 DIAGNOSIS — N1831 Chronic kidney disease, stage 3a: Secondary | ICD-10-CM | POA: Diagnosis present

## 2020-02-23 DIAGNOSIS — K7460 Unspecified cirrhosis of liver: Secondary | ICD-10-CM | POA: Diagnosis present

## 2020-02-23 DIAGNOSIS — B182 Chronic viral hepatitis C: Secondary | ICD-10-CM

## 2020-02-23 DIAGNOSIS — E1122 Type 2 diabetes mellitus with diabetic chronic kidney disease: Secondary | ICD-10-CM | POA: Diagnosis present

## 2020-02-23 DIAGNOSIS — F1010 Alcohol abuse, uncomplicated: Secondary | ICD-10-CM

## 2020-02-23 DIAGNOSIS — M1990 Unspecified osteoarthritis, unspecified site: Secondary | ICD-10-CM | POA: Diagnosis present

## 2020-02-23 DIAGNOSIS — I2510 Atherosclerotic heart disease of native coronary artery without angina pectoris: Secondary | ICD-10-CM | POA: Diagnosis present

## 2020-02-23 DIAGNOSIS — Z8546 Personal history of malignant neoplasm of prostate: Secondary | ICD-10-CM | POA: Diagnosis not present

## 2020-02-23 DIAGNOSIS — Z20822 Contact with and (suspected) exposure to covid-19: Secondary | ICD-10-CM | POA: Diagnosis present

## 2020-02-23 DIAGNOSIS — E1159 Type 2 diabetes mellitus with other circulatory complications: Secondary | ICD-10-CM

## 2020-02-23 DIAGNOSIS — I5042 Chronic combined systolic (congestive) and diastolic (congestive) heart failure: Secondary | ICD-10-CM | POA: Diagnosis present

## 2020-02-23 DIAGNOSIS — E7800 Pure hypercholesterolemia, unspecified: Secondary | ICD-10-CM | POA: Diagnosis present

## 2020-02-23 DIAGNOSIS — I482 Chronic atrial fibrillation, unspecified: Secondary | ICD-10-CM

## 2020-02-23 DIAGNOSIS — I130 Hypertensive heart and chronic kidney disease with heart failure and stage 1 through stage 4 chronic kidney disease, or unspecified chronic kidney disease: Secondary | ICD-10-CM | POA: Diagnosis present

## 2020-02-23 DIAGNOSIS — I5032 Chronic diastolic (congestive) heart failure: Secondary | ICD-10-CM | POA: Diagnosis not present

## 2020-02-23 DIAGNOSIS — E1140 Type 2 diabetes mellitus with diabetic neuropathy, unspecified: Secondary | ICD-10-CM | POA: Diagnosis present

## 2020-02-23 DIAGNOSIS — E860 Dehydration: Secondary | ICD-10-CM | POA: Diagnosis present

## 2020-02-23 DIAGNOSIS — Z7901 Long term (current) use of anticoagulants: Secondary | ICD-10-CM | POA: Diagnosis not present

## 2020-02-23 DIAGNOSIS — K219 Gastro-esophageal reflux disease without esophagitis: Secondary | ICD-10-CM | POA: Diagnosis present

## 2020-02-23 DIAGNOSIS — F1190 Opioid use, unspecified, uncomplicated: Secondary | ICD-10-CM | POA: Diagnosis present

## 2020-02-23 DIAGNOSIS — E871 Hypo-osmolality and hyponatremia: Secondary | ICD-10-CM | POA: Diagnosis present

## 2020-02-23 DIAGNOSIS — Z794 Long term (current) use of insulin: Secondary | ICD-10-CM

## 2020-02-23 DIAGNOSIS — R296 Repeated falls: Secondary | ICD-10-CM | POA: Diagnosis present

## 2020-02-23 DIAGNOSIS — Z8673 Personal history of transient ischemic attack (TIA), and cerebral infarction without residual deficits: Secondary | ICD-10-CM | POA: Diagnosis not present

## 2020-02-23 DIAGNOSIS — F17210 Nicotine dependence, cigarettes, uncomplicated: Secondary | ICD-10-CM | POA: Diagnosis present

## 2020-02-23 DIAGNOSIS — I4821 Permanent atrial fibrillation: Secondary | ICD-10-CM | POA: Diagnosis present

## 2020-02-23 DIAGNOSIS — R627 Adult failure to thrive: Secondary | ICD-10-CM | POA: Diagnosis present

## 2020-02-23 DIAGNOSIS — R55 Syncope and collapse: Secondary | ICD-10-CM | POA: Diagnosis not present

## 2020-02-23 LAB — RAPID URINE DRUG SCREEN, HOSP PERFORMED
Amphetamines: NOT DETECTED
Barbiturates: NOT DETECTED
Benzodiazepines: NOT DETECTED
Cocaine: NOT DETECTED
Opiates: NOT DETECTED
Tetrahydrocannabinol: NOT DETECTED

## 2020-02-23 LAB — BASIC METABOLIC PANEL
Anion gap: 11 (ref 5–15)
Anion gap: 7 (ref 5–15)
BUN: 16 mg/dL (ref 6–20)
BUN: 18 mg/dL (ref 6–20)
CO2: 27 mmol/L (ref 22–32)
CO2: 28 mmol/L (ref 22–32)
Calcium: 8.6 mg/dL — ABNORMAL LOW (ref 8.9–10.3)
Calcium: 8.6 mg/dL — ABNORMAL LOW (ref 8.9–10.3)
Chloride: 88 mmol/L — ABNORMAL LOW (ref 98–111)
Chloride: 97 mmol/L — ABNORMAL LOW (ref 98–111)
Creatinine, Ser: 1.6 mg/dL — ABNORMAL HIGH (ref 0.61–1.24)
Creatinine, Ser: 1.56 mg/dL — ABNORMAL HIGH (ref 0.61–1.24)
GFR calc Af Amer: 53 mL/min — ABNORMAL LOW (ref >60)
GFR calc Af Amer: 55 mL/min — ABNORMAL LOW (ref >60)
GFR calc non Af Amer: 46 mL/min — ABNORMAL LOW (ref >60)
GFR calc non Af Amer: 48 mL/min — ABNORMAL LOW (ref >60)
Glucose, Bld: 135 mg/dL — ABNORMAL HIGH (ref 70–99)
Glucose, Bld: 139 mg/dL — ABNORMAL HIGH (ref 70–99)
Potassium: 4 mmol/L (ref 3.5–5.1)
Potassium: 3.9 mmol/L (ref 3.5–5.1)
Sodium: 126 mmol/L — ABNORMAL LOW (ref 135–145)
Sodium: 132 mmol/L — ABNORMAL LOW (ref 135–145)

## 2020-02-23 LAB — GLUCOSE, CAPILLARY
Glucose-Capillary: 127 mg/dL — ABNORMAL HIGH (ref 70–99)
Glucose-Capillary: 185 mg/dL — ABNORMAL HIGH (ref 70–99)
Glucose-Capillary: 135 mg/dL — ABNORMAL HIGH (ref 70–99)
Glucose-Capillary: 128 mg/dL — ABNORMAL HIGH (ref 70–99)

## 2020-02-23 LAB — HEMOGLOBIN A1C
Hgb A1c MFr Bld: 6.8 % — ABNORMAL HIGH (ref 4.8–5.6)
Mean Plasma Glucose: 148.46 mg/dL

## 2020-02-23 LAB — SARS CORONAVIRUS 2 (TAT 6-24 HRS): SARS Coronavirus 2: NEGATIVE

## 2020-02-23 LAB — MAGNESIUM: Magnesium: 1.7 mg/dL (ref 1.7–2.4)

## 2020-02-23 LAB — PHOSPHORUS: Phosphorus: 3.3 mg/dL (ref 2.5–4.6)

## 2020-02-23 LAB — CBG MONITORING, ED: Glucose-Capillary: 174 mg/dL — ABNORMAL HIGH (ref 70–99)

## 2020-02-23 MED ORDER — GLUCERNA SHAKE PO LIQD
237.0000 mL | Freq: Three times a day (TID) | ORAL | Status: DC
  Administered 2020-02-23 – 2020-02-24 (×2): 237 mL via ORAL
  Filled 2020-02-23 (×4): qty 237

## 2020-02-23 MED ORDER — ATORVASTATIN CALCIUM 10 MG PO TABS
20.0000 mg | ORAL_TABLET | Freq: Every day | ORAL | Status: DC
  Administered 2020-02-23: 20 mg via ORAL
  Filled 2020-02-23: qty 2

## 2020-02-23 MED ORDER — FOLIC ACID 1 MG PO TABS
1.0000 mg | ORAL_TABLET | Freq: Every day | ORAL | Status: DC
  Administered 2020-02-23 – 2020-02-24 (×2): 1 mg via ORAL
  Filled 2020-02-23 (×2): qty 1

## 2020-02-23 MED ORDER — ONDANSETRON HCL 4 MG PO TABS: 4.0000 mg | ORAL_TABLET | Freq: Four times a day (QID) | ORAL | Status: DC | PRN

## 2020-02-23 MED ORDER — ISOSORB DINITRATE-HYDRALAZINE 20-37.5 MG PO TABS
1.0000 | ORAL_TABLET | Freq: Three times a day (TID) | ORAL | Status: DC
  Administered 2020-02-23 – 2020-02-24 (×4): 1 via ORAL
  Filled 2020-02-23 (×8): qty 1

## 2020-02-23 MED ORDER — TRAMADOL HCL 50 MG PO TABS
25.0000 mg | ORAL_TABLET | Freq: Four times a day (QID) | ORAL | Status: DC | PRN
  Administered 2020-02-23 – 2020-02-24 (×2): 50 mg via ORAL
  Filled 2020-02-23 (×3): qty 1

## 2020-02-23 MED ORDER — THIAMINE HCL 100 MG PO TABS
100.0000 mg | ORAL_TABLET | Freq: Every day | ORAL | Status: DC
  Administered 2020-02-23 – 2020-02-24 (×2): 100 mg via ORAL
  Filled 2020-02-23 (×3): qty 1

## 2020-02-23 MED ORDER — AMITRIPTYLINE HCL 10 MG PO TABS
20.0000 mg | ORAL_TABLET | Freq: Every day | ORAL | Status: DC
  Administered 2020-02-23 (×2): 20 mg via ORAL
  Filled 2020-02-23 (×5): qty 2

## 2020-02-23 MED ORDER — DULOXETINE HCL 30 MG PO CPEP
60.0000 mg | ORAL_CAPSULE | Freq: Every day | ORAL | Status: DC
  Administered 2020-02-23 – 2020-02-24 (×2): 60 mg via ORAL
  Filled 2020-02-23: qty 2
  Filled 2020-02-23: qty 1

## 2020-02-23 MED ORDER — SODIUM CHLORIDE 0.9 % IV SOLN: INTRAVENOUS | Status: DC

## 2020-02-23 MED ORDER — INSULIN ASPART 100 UNIT/ML ~~LOC~~ SOLN
0.0000 [IU] | Freq: Every day | SUBCUTANEOUS | Status: DC
  Filled 2020-02-23: qty 0.05

## 2020-02-23 MED ORDER — ADULT MULTIVITAMIN W/MINERALS CH
1.0000 | ORAL_TABLET | Freq: Every day | ORAL | Status: DC
  Administered 2020-02-23 – 2020-02-24 (×2): 1 via ORAL
  Filled 2020-02-23 (×2): qty 1

## 2020-02-23 MED ORDER — DICYCLOMINE HCL 20 MG PO TABS
20.0000 mg | ORAL_TABLET | Freq: Two times a day (BID) | ORAL | Status: DC | PRN
  Administered 2020-02-23: 20 mg via ORAL
  Filled 2020-02-23: qty 1

## 2020-02-23 MED ORDER — THIAMINE HCL 100 MG/ML IJ SOLN
100.0000 mg | Freq: Every day | INTRAMUSCULAR | Status: DC
  Filled 2020-02-23: qty 1

## 2020-02-23 MED ORDER — INSULIN ASPART 100 UNIT/ML ~~LOC~~ SOLN
0.0000 [IU] | Freq: Three times a day (TID) | SUBCUTANEOUS | Status: DC
  Administered 2020-02-24: 2 [IU] via SUBCUTANEOUS
  Filled 2020-02-23: qty 0.09

## 2020-02-23 MED ORDER — ONDANSETRON HCL 4 MG/2ML IJ SOLN: 4.0000 mg | Freq: Four times a day (QID) | INTRAMUSCULAR | Status: DC | PRN

## 2020-02-23 MED ORDER — ACETAMINOPHEN 325 MG PO TABS
650.0000 mg | ORAL_TABLET | Freq: Four times a day (QID) | ORAL | Status: DC | PRN
  Administered 2020-02-23 – 2020-02-24 (×2): 650 mg via ORAL
  Filled 2020-02-23 (×2): qty 2

## 2020-02-23 MED ORDER — ZOLPIDEM TARTRATE 5 MG PO TABS
5.0000 mg | ORAL_TABLET | Freq: Every evening | ORAL | Status: DC | PRN
  Administered 2020-02-23: 5 mg via ORAL
  Filled 2020-02-23: qty 1

## 2020-02-23 MED ORDER — LORAZEPAM 1 MG PO TABS: 1.0000 mg | ORAL_TABLET | ORAL | Status: DC | PRN

## 2020-02-23 MED ORDER — ALBUTEROL SULFATE (2.5 MG/3ML) 0.083% IN NEBU: 2.5000 mg | INHALATION_SOLUTION | Freq: Four times a day (QID) | RESPIRATORY_TRACT | Status: DC | PRN

## 2020-02-23 MED ORDER — GABAPENTIN 300 MG PO CAPS
600.0000 mg | ORAL_CAPSULE | Freq: Two times a day (BID) | ORAL | Status: DC
  Administered 2020-02-23 – 2020-02-24 (×4): 600 mg via ORAL
  Filled 2020-02-23 (×4): qty 2

## 2020-02-23 MED ORDER — DIGOXIN 125 MCG PO TABS
0.1250 mg | ORAL_TABLET | Freq: Every day | ORAL | Status: DC
  Administered 2020-02-23 – 2020-02-24 (×2): 0.125 mg via ORAL
  Filled 2020-02-23 (×2): qty 1

## 2020-02-23 MED ORDER — ACETAMINOPHEN 650 MG RE SUPP: 650.0000 mg | Freq: Four times a day (QID) | RECTAL | Status: DC | PRN

## 2020-02-23 MED ORDER — AMIODARONE HCL 200 MG PO TABS
200.0000 mg | ORAL_TABLET | Freq: Every day | ORAL | Status: DC
  Administered 2020-02-23 – 2020-02-24 (×2): 200 mg via ORAL
  Filled 2020-02-23 (×2): qty 1

## 2020-02-23 MED ORDER — LORAZEPAM 2 MG/ML IJ SOLN: 1.0000 mg | INTRAMUSCULAR | Status: DC | PRN

## 2020-02-23 MED ORDER — MIRTAZAPINE 15 MG PO TABS
30.0000 mg | ORAL_TABLET | Freq: Every day | ORAL | Status: DC
  Administered 2020-02-23 (×2): 30 mg via ORAL
  Filled 2020-02-23 (×2): qty 2

## 2020-02-23 NOTE — Progress Notes (Addendum)
PROGRESS NOTE  Brad [name] DOB: 1961/04/06 DOA: 02/22/2020 PCP: Charlott Rakes, MD   LOS: 0 days   Brief Narrative / Interim history: No charge note.  Patient admitted overnight by Dr. Alcario Drought, H&P reviewed and agree with the assessment and plan  This is a pleasant with complex cardiac history with chronic systolic CHF with EF of [id number], A. fib on Eliquis, ongoing alcohol abuse, liver cirrhosis, HCV, hyponatremia who comes into the hospital with multiple falls.  He has had recurrent falls and was seen in the ED just 3 weeks ago.  He reports taking all his medications as prescribed, however states that over the last week he has been drinking significant amount of alcohol, especially beer, and has not eaten any significant food in the last 7 days, he denies any nausea or vomiting and just states that he was drinking and not eating.  He has been feeling very weak, and has continued to take all his diuretics at home.  He reports several falls in the last 24 hours and he thinks he passed out on couple of occasions.  Subjective / 24h Interval events: Feeling better this morning, has not been up yet. No chest pain, no palpitations, no leg swelling.  Assessment & Plan: Principal Problem Acute on chronic hyponatremia -Patient sodium ranges between upper 120s and lower 130s.  He remains hyponatremic this morning and clinically dehydrated, will need continued IV normal saline, requires to remain inpatient for further sodium correction -Advance diet, seems to be tolerating breakfast -Hold home diuretics, clinically he is dehydrated -Repeat sodium levels tonight  Active Problems Chronic systolic CHF -Holding diuretics as above, continue BiDil  Recurrent falls, FTT, syncope -Physical therapy has been ordered, given depressed EF as well as reported syncopal episodes at home transfer patient to telemetry as currently he is on MedSurg.  There has been a discussion about ICD however he  will need to be free of substance abuse  Alcohol abuse -Patient reports heavy drinking on a daily basis, mainly beer, reports that his father passed away 2 weeks ago and has been drinking more due to that reason -Consult social worker -Continue CIWA  Paroxysmal A. fib -Continue rate control with digoxin, amiodarone, transfer patient to telemetry.  Eliquis on hold for now  Type 2 diabetes mellitus -Continue sensitive sliding scale  Chronic kidney disease 3a -Patient's creatinine ranges from 1.0-1.9 but mostly within 1.3-1.6.  Currently at baseline.  Scheduled Meds: . amiodarone  200 mg Oral Daily  . amitriptyline  20 mg Oral QHS  . atorvastatin  20 mg Oral q1800  . digoxin  0.125 mg Oral Daily  . DULoxetine  60 mg Oral Daily  . folic acid  1 mg Oral Daily  . gabapentin  600 mg Oral BID  . insulin aspart  0-5 Units Subcutaneous QHS  . insulin aspart  0-9 Units Subcutaneous TID WC  . isosorbide-hydrALAZINE  1 tablet Oral TID  . mirtazapine  30 mg Oral QHS  . multivitamin with minerals  1 tablet Oral Daily  . thiamine  100 mg Oral Daily   Or  . thiamine  100 mg Intravenous Daily   Continuous Infusions: . sodium chloride 125 mL/hr at 02/23/20 0918   PRN Meds:.acetaminophen **OR** acetaminophen, albuterol, dicyclomine, LORazepam **OR** LORazepam, ondansetron **OR** ondansetron (ZOFRAN) IV, zolpidem  DVT prophylaxis: SCDs Code Status: Full code Family Communication: d/w patient  Patient admitted from: home Anticipated d/c place: home Barriers to d/c: Remains hyponatremic requiring continuous IV fluids, clinically dehydrated,  need for cardiac monitoring due to syncope & heart failure & A fib  Consultants:  None   Procedures:  None   Microbiology  None   Antimicrobials: None     Objective: Vitals:   02/23/20 0120 02/23/20 0140 02/23/20 0243 02/23/20 0559  BP: (!) 150/95  (!) 111/59 133/69  Pulse: 71  66 63  Resp: 18  16 16   Temp: 98.5 F (36.9 C)  98.4 F (36.9  C) 98.1 F (36.7 C)  TempSrc: Oral  Oral Oral  SpO2: 99%  97% 96%  Weight:  76.3 kg    Height:  5\' 11"  (1.803 m)      Intake/Output Summary (Last 24 hours) at 02/23/2020 0929 Last data filed at 02/23/2020 0842 Gross per 24 hour  Intake 1837.5 ml  Output --  Net 1837.5 ml   Filed Weights   02/23/20 0140  Weight: 76.3 kg    Examination:  Constitutional: NAD Eyes: no scleral icterus ENMT: Mucous membranes are moist.  Neck: normal, supple Respiratory: clear to auscultation bilaterally, no wheezing, no crackles. Normal respiratory effort.  Cardiovascular: Regular rate and rhythm, no murmurs / rubs / gallops. No LE edema.  Abdomen: non distended, no tenderness. Bowel sounds positive.  Musculoskeletal: no clubbing / cyanosis.  Skin: no rashes Neurologic: CN 2-12 grossly intact. Strength 5/5 in all 4.   Data Reviewed: I have independently reviewed following labs and imaging studies   CBC: Recent Labs  Lab 02/21/20 0425 02/22/20 2017  WBC 1.6* 2.9*  NEUTROABS 0.9* 2.0  HGB 13.5 13.2  HCT 38.8* 37.1*  MCV 90.4 89.4  PLT 206 0000000   Basic Metabolic Panel: Recent Labs  Lab 02/21/20 0425 02/22/20 2045 02/23/20 0005 02/23/20 0357  NA 127* 120*  --  126*  K 4.1 4.3  --  4.0  CL 89* 85*  --  88*  CO2 25 21*  --  27  GLUCOSE 110* 159*  --  135*  BUN 18 12  --  16  CREATININE 1.56* 1.57*  --  1.60*  CALCIUM 8.9 8.3*  --  8.6*  MG  --   --  1.7  --   PHOS  --   --  3.3  --    Liver Function Tests: Recent Labs  Lab 02/21/20 0425  AST 90*  ALT 46*  ALKPHOS 75  BILITOT 0.8  PROT 8.3*  ALBUMIN 3.8   Coagulation Profile: No results for input(s): INR, PROTIME in the last 168 hours. HbA1C: Recent Labs    02/23/20 0017  HGBA1C 6.8*   CBG: Recent Labs  Lab 02/23/20 0029 02/23/20 0806  GLUCAP 174* 127*    No results found for this or any previous visit (from the past 240 hour(s)).   Radiology Studies: CT Head Wo Contrast  Result Date: 02/22/2020 CLINICAL  DATA:  History of recent falls with poor oral intake EXAM: CT HEAD WITHOUT CONTRAST TECHNIQUE: Contiguous axial images were obtained from the base of the skull through the vertex without intravenous contrast. COMPARISON:  02/04/2020 FINDINGS: Brain: Mild atrophic changes and chronic white matter ischemic changes are seen. No findings to suggest acute hemorrhage, acute infarction or space-occupying mass lesion are noted. Vascular: No hyperdense vessel or unexpected calcification. Skull: Normal. Negative for fracture or focal lesion. Sinuses/Orbits: No acute finding. Other: Mild scalp hematoma is noted in the right posterior parietal region near the vertex. Multiple small radiopaque densities are noted likely related to foreign bodies. These are decreased when compared with the  prior exam. IMPRESSION: Chronic atrophic and ischemic changes are noted. Small right scalp hematoma as described with small foreign bodies within. These are decreased in the interval from the prior exam. No acute abnormality is noted. Electronically Signed   By: Inez Catalina M.D.   On: 02/22/2020 20:12   DG Chest Portable 1 View  Result Date: 02/22/2020 CLINICAL DATA:  Syncope EXAM: PORTABLE CHEST 1 VIEW COMPARISON:  10/31/2019 FINDINGS: Cardiomegaly with mild central congestion. No consolidation, pleural effusion, or pneumothorax. IMPRESSION: No active disease.  Cardiomegaly with mild central congestion Electronically Signed   By: Donavan Foil M.D.   On: 02/22/2020 19:51   Marzetta Board, MD, PhD [facility]  Between 7 am - 7 pm I am available, please contact me via Amion or Securechat  Between 7 pm - 7 am I am not available, please contact night coverage MD/APP via Amion

## 2020-02-23 NOTE — ED Notes (Signed)
Report called to receiving floor. Admitting MD contacted to D/C cardiac monitoring.

## 2020-02-23 NOTE — TOC Initial Note (Signed)
Transition of Care [facility]) - Initial/Assessment Note    Patient Details  Name: Brad [name] MRN: [id number] Date of Birth: Apr 16, 1961  Transition [facility]) CM/SW Contact:    Trish Mage, LCSW Phone Number: 02/23/2020, 2:51 PM  Clinical Narrative:  Patient here for acute hyponatremia, alcohol withdrawal seen in response to consult for SA, specifically alcohol.  While patient admits to recent increase in use "and not eating," he denies that it is an ongoing problem.  "I was told by my sister last week that my daddy died."   He went on to tell me of all the people he has lost over the years, including 2 sons, and it would appear he is dealing with grief and loss issues by drinking. He declined resources for alcohol cessation.   Asked for help with housing, stating his current apartment has roaches, and they just got rid of bed bugs.  "We have a slum landlord."  Splits rent with a good friend.  Has been getting disability for about a year. Gets food stamps. Says the apartment does not have a stove or oven; just a microwave and hot plate.  Knows that he does not eat as well as he should as a result.  Goes to [facility] for meds; has social work support there as well. Cites his roommate as his main support. Sister and uncle are peripheral supports. He asked about getting a cane.  Order by MD appreciated.  He is not eligible for a free cane until June as he got his last one in June of 2020. Will give him a bus pass at d/c for ride home. TOC will continue to follow during the course of hospitalization.            Expected Discharge Plan: Home/Self Care Barriers to Discharge: No Barriers Identified   Patient Goals and CMS Choice Patient states their goals for this hospitalization and ongoing recovery are:: "I'm living in a bad place.  Can you get me another apartment?" CMS Medicare.gov Compare Post Acute Care list provided to:: Patient Choice offered to / list presented to : Patient  Expected  Discharge Plan and Services Expected Discharge Plan: Home/Self Care     Post Acute Care Choice: Durable Medical Equipment Living arrangements for the past 2 months: Apartment                 DME Arranged: Kasandra Knudsen DME Agency: [employer] Date DME Agency Contacted: 02/23/20 Time DME Agency Contacted: [phone] Representative spoke with at DME Agency: [place] Arrangements/Services Living arrangements for the past 2 months: Apartment Lives with:: Roommate Patient language and need for interpreter reviewed:: Yes Do you feel safe going back to the place where you live?: Yes      Need for Family Participation in Patient Care: No (Comment) Care giver support system in place?: Yes (comment)   Criminal Activity/Legal Involvement Pertinent to Current Situation/Hospitalization: No - Comment as needed  Activities of Daily Living Home Assistive Devices/Equipment: Cane (specify quad or straight), Eyeglasses ADL Screening (condition at time of admission) Patient's cognitive ability adequate to safely complete daily activities?: Yes Is the patient deaf or have difficulty hearing?: No Does the patient have difficulty seeing, even when wearing glasses/contacts?: No Does the patient have difficulty concentrating, remembering, or making decisions?: No Patient able to express need for assistance with ADLs?: Yes Does the patient have difficulty dressing or bathing?: No Independently performs ADLs?: Yes (  appropriate for developmental age) Does the patient have difficulty walking or climbing stairs?: No Weakness of Legs: Right Weakness of Arms/Hands: None  Permission Sought/Granted                  Emotional Assessment Appearance:: Appears stated age Attitude/Demeanor/Rapport: Engaged Affect (typically observed): Appropriate Orientation: : Oriented to Self, Oriented to Place, Oriented to Situation Alcohol / Substance Use: Alcohol Use Psych Involvement: No  (comment)  Admission diagnosis:  Acute hyponatremia [E87.1] Alcohol abuse [F10.10] Hyponatremia [E87.1] Injury of head, initial encounter [S09.90XA] Fall, initial encounter [W19.XXXA] Syncope, unspecified syncope type [R55] Syncope [R55] Patient Active Problem List   Diagnosis Date Noted  . Chronic systolic CHF (congestive heart failure) ([place]) 10/31/2019  . Acute systolic CHF (congestive heart failure) ([place]) 08/02/2019  . Diarrhea 07/29/2019  . Gastroenteritis 07/22/2019  . Hypomagnesemia 06/19/2019  . Chest pain 06/07/2019  . Elevated troponin 05/23/2019  . Tobacco abuse 05/23/2019  . Alcohol abuse 02/21/2019  . Frequent falls 01/17/2019  . Severe mitral regurgitation   . Fall 11/01/2018  . Hyponatremia 10/31/2018  . Chronic atrial fibrillation   . Chronic hyponatremia 08/16/2018  . NSVT (nonsustained ventricular tachycardia) ([place])   . Concussion with loss of consciousness   . Scalp laceration   . Trauma   . Permanent atrial fibrillation   . Hypertensive heart disease   . Shortness of breath   . Pyogenic inflammation of bone ([place])   . Cirrhosis ([place]) 03/23/2018  . Right ankle pain 03/23/2018  . Syncope 08/07/2017  . Acute on chronic combined systolic and diastolic CHF (congestive heart failure) ([place]) 07/09/2017  . Atrial flutter ([place]) 07/09/2017  . Pre-syncope 07/08/2017  . Neuropathy 05/08/2017  . Substance induced mood disorder ([place]) 10/06/2016  . CVA (cerebral vascular accident) ([place]) 09/18/2016  . Left sided numbness   . Homelessness 08/21/2016  . S/P ORIF (open reduction internal fixation) fracture 08/01/2016  . CAD (coronary artery disease), native coronary artery 07/30/2016  . Surgery, elective   . Insomnia 07/22/2016  . NSTEMI (non-ST elevated myocardial infarction) ([place])   . Anemia 07/05/2016  . Thrombocytopenia ([place]) 07/05/2016  . Cocaine abuse ([place]) 07/02/2016  . Chest pain on breathing 07/01/2016  . Essential hypertension 07/01/2016  . DM type 2  (diabetes mellitus, type 2) ([place]) 07/01/2016  . Hypokalemia 07/01/2016  . CKD (chronic kidney disease) stage 3, GFR 30-59 ml/min 07/01/2016  . Painful diabetic neuropathy ([place]) 07/01/2016  . Acute hyponatremia 05/27/2016  . Polysubstance abuse ([place]) 05/27/2016  . Chronic hepatitis C with cirrhosis ([place]) 05/27/2016  . Depression 04/21/2012  . GERD (gastroesophageal reflux disease) 02/16/2012  . History of drug abuse ([place])   . Heroin addiction ([place]) 01/29/2012   PCP:  Charlott Rakes, MD Pharmacy:   [place], [place] - [address] [place] [address] Phone: [phone] Fax: [phone]     Social Determinants of Health (SDOH) Interventions    Readmission Risk Interventions Readmission Risk Prevention Plan 11/03/2019 08/03/2019 07/20/2019  Transportation Screening Complete Complete Complete  Medication Review (RN Care Manager) Complete Complete Complete  PCP or Specialist appointment within 3-5 days of discharge Complete - Complete  HRI or Home Care Consult Complete Not Complete Not Complete  HRI or Home Care Consult Pt Refusal Comments - pt is homeless pt is homeless  SW Recovery Care/Counseling Consult Complete Complete Complete  Palliative Care Screening Not Applicable Not Applicable Not [place] Not Applicable Not Applicable Not Applicable  Some recent data might be hidden

## 2020-02-23 NOTE — Progress Notes (Signed)
Initial Nutrition Assessment  DOCUMENTATION CODES:   Not applicable  INTERVENTION:  Glucerna Shake po TID, each supplement provides 220 kcal and 10 grams of protein (strawberry)  Continue MVI with minerals daily  Refeeding Risk, recommend monitoring magnesium, potassium, and phosphorus,MD to replete as needed   NUTRITION DIAGNOSIS:   Inadequate oral intake related to decreased appetite as evidenced by per patient/family report, energy intake < or equal to 50% for > or equal to 5 days(secondary to heavy EtOH consumption).  GOAL:   Patient will meet greater than or equal to 90% of their needs   MONITOR:   PO intake, Weight trends, Labs, I & O's, Supplement acceptance  REASON FOR ASSESSMENT:   Malnutrition Screening Tool    ASSESSMENT:  RD working remotely.  59 year old male with past medical history significant of chronic combined systolic and diastolic CHF, cirrhosis, CKD stage III, DM, GERD, hepatitis C, HTN, neuropathy, cocaine use, EtOH abuse, and NICM presented to ED with complaints of multiple falls due to generalized weakness in the setting of alcohol abuse.  Patient admitted for acute hyponatremia.  Per notes: -CT with no evidence of acute hemorrhage -chronic hyponatremia, ranges between upper 120s and lower 130s  I/Os: +1597 ml since admit  Spoke with patient via phone this afternoon, he reports feeling better today and was trying to take a nap. Patient unable to recall what he had for lunch, but stated it was good and he ate it all. Patient recalled 100% of his omelette and grits for breakfast.   Patient endorses very poor appetite over the last 5 days. He stated that he was drinking a lot of beer and did not eat any food during that time. Patient reports 2-3 bottles of Old English daily x 5 days. He recalls usually not consuming alcohol everyday and having a good appetite, eating 2 meals/day most of the time.  Patient on a CM diet,  amenable to strawberry  Glucerna during admission to aid with estimated needs.  Given patient dietary recall, he is at high risk for refeeding, recommend monitoring Mg, K, and P, MD to replete as needed.  UBW 170 lbs per pt report Current wt 167.86 lbs Per review of history, weights have fluctuated ~ 11 lbs over the past 3 months. On 11/28/19 pt wt 74.1 kg (163.02 lbs), on 1/04 pt wt 80.5 kg (177.1 lbs), on 1/19 pt wt 82 kg (180.4 lbs), on 1/25 pt wt 82.06 kg (181.72 lbs) on 2/22 pt wt 78.9 kg (173.58 lbs)  Suspect weight history likely d/t fluid shifts, pt reports history of swelling lower extremities, stated that he is compliant with daily Lasix. Patient reports he does not weigh on daily basis because he does not have a scale at home.  Medications reviewed and include: folic acid, gabapentin, SSI, remeron, MVI, thiamine IVF: NaCl Labs: CBGs 185, 127, 174, Na 126 (L), Cr 1.60 (H)  NUTRITION - FOCUSED PHYSICAL EXAM: Unable to complete at this time, RD working remotely.  Diet Order:   Diet Order            Diet Carb Modified Fluid consistency: Thin; Room service appropriate? Yes  Diet effective now              EDUCATION NEEDS:   Not appropriate for education at this time  Skin:  Skin Assessment: Reviewed RN Assessment  Last BM:  3/4 type 6  Height:   Ht Readings from Last 1 Encounters:  02/23/20 5\' 11"  (1.803 m)  Weight:   Wt Readings from Last 1 Encounters:  02/23/20 76.3 kg    Ideal Body Weight:     BMI:  Body mass index is 23.46 kg/m.  Estimated Nutritional Needs:   Kcal:  1900-2100  Protein:  95-105  Fluid:  >/= 1.9 L/day   Lajuan Lines, RD, LDN Clinical Nutrition After Hours/Weekend Pager # in [place]
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
## 2020-02-24 DIAGNOSIS — I5032 Chronic diastolic (congestive) heart failure: Secondary | ICD-10-CM

## 2020-02-24 DIAGNOSIS — I5022 Chronic systolic (congestive) heart failure: Secondary | ICD-10-CM

## 2020-02-24 LAB — CBC
HCT: 37.1 % — ABNORMAL LOW (ref 39.0–52.0)
Hemoglobin: 12.4 g/dL — ABNORMAL LOW (ref 13.0–17.0)
MCH: 31.7 pg (ref 26.0–34.0)
MCHC: 33.4 g/dL (ref 30.0–36.0)
MCV: 94.9 fL (ref 80.0–100.0)
Platelets: 124 10*3/uL — ABNORMAL LOW (ref 150–400)
RBC: 3.91 MIL/uL — ABNORMAL LOW (ref 4.22–5.81)
RDW: 13.4 % (ref 11.5–15.5)
WBC: 3.4 10*3/uL — ABNORMAL LOW (ref 4.0–10.5)
nRBC: 0 % (ref 0.0–0.2)

## 2020-02-24 LAB — GLUCOSE, CAPILLARY
Glucose-Capillary: 179 mg/dL — ABNORMAL HIGH (ref 70–99)
Glucose-Capillary: 120 mg/dL — ABNORMAL HIGH (ref 70–99)

## 2020-02-24 LAB — COMPREHENSIVE METABOLIC PANEL
ALT: 31 U/L (ref 0–44)
AST: 43 U/L — ABNORMAL HIGH (ref 15–41)
Albumin: 3.2 g/dL — ABNORMAL LOW (ref 3.5–5.0)
Alkaline Phosphatase: 90 U/L (ref 38–126)
Anion gap: 8 (ref 5–15)
BUN: 21 mg/dL — ABNORMAL HIGH (ref 6–20)
CO2: 23 mmol/L (ref 22–32)
Calcium: 8.6 mg/dL — ABNORMAL LOW (ref 8.9–10.3)
Chloride: 101 mmol/L (ref 98–111)
Creatinine, Ser: 1.5 mg/dL — ABNORMAL HIGH (ref 0.61–1.24)
GFR calc Af Amer: 58 mL/min — ABNORMAL LOW (ref >60)
GFR calc non Af Amer: 50 mL/min — ABNORMAL LOW (ref >60)
Glucose, Bld: 163 mg/dL — ABNORMAL HIGH (ref 70–99)
Potassium: 4.1 mmol/L (ref 3.5–5.1)
Sodium: 132 mmol/L — ABNORMAL LOW (ref 135–145)
Total Bilirubin: 0.6 mg/dL (ref 0.3–1.2)
Total Protein: 7 g/dL (ref 6.5–8.1)

## 2020-02-24 IMAGING — DX DG CHEST 2V
2 series · 2 of 2 positions shown · non-contrast
Comparison: 10/31/2018

CLINICAL DATA: 57-year-old with shortness of breath. Worsening
bilateral lower extremity edema.

EXAM:
CHEST - 2 VIEW

[w chest pa]
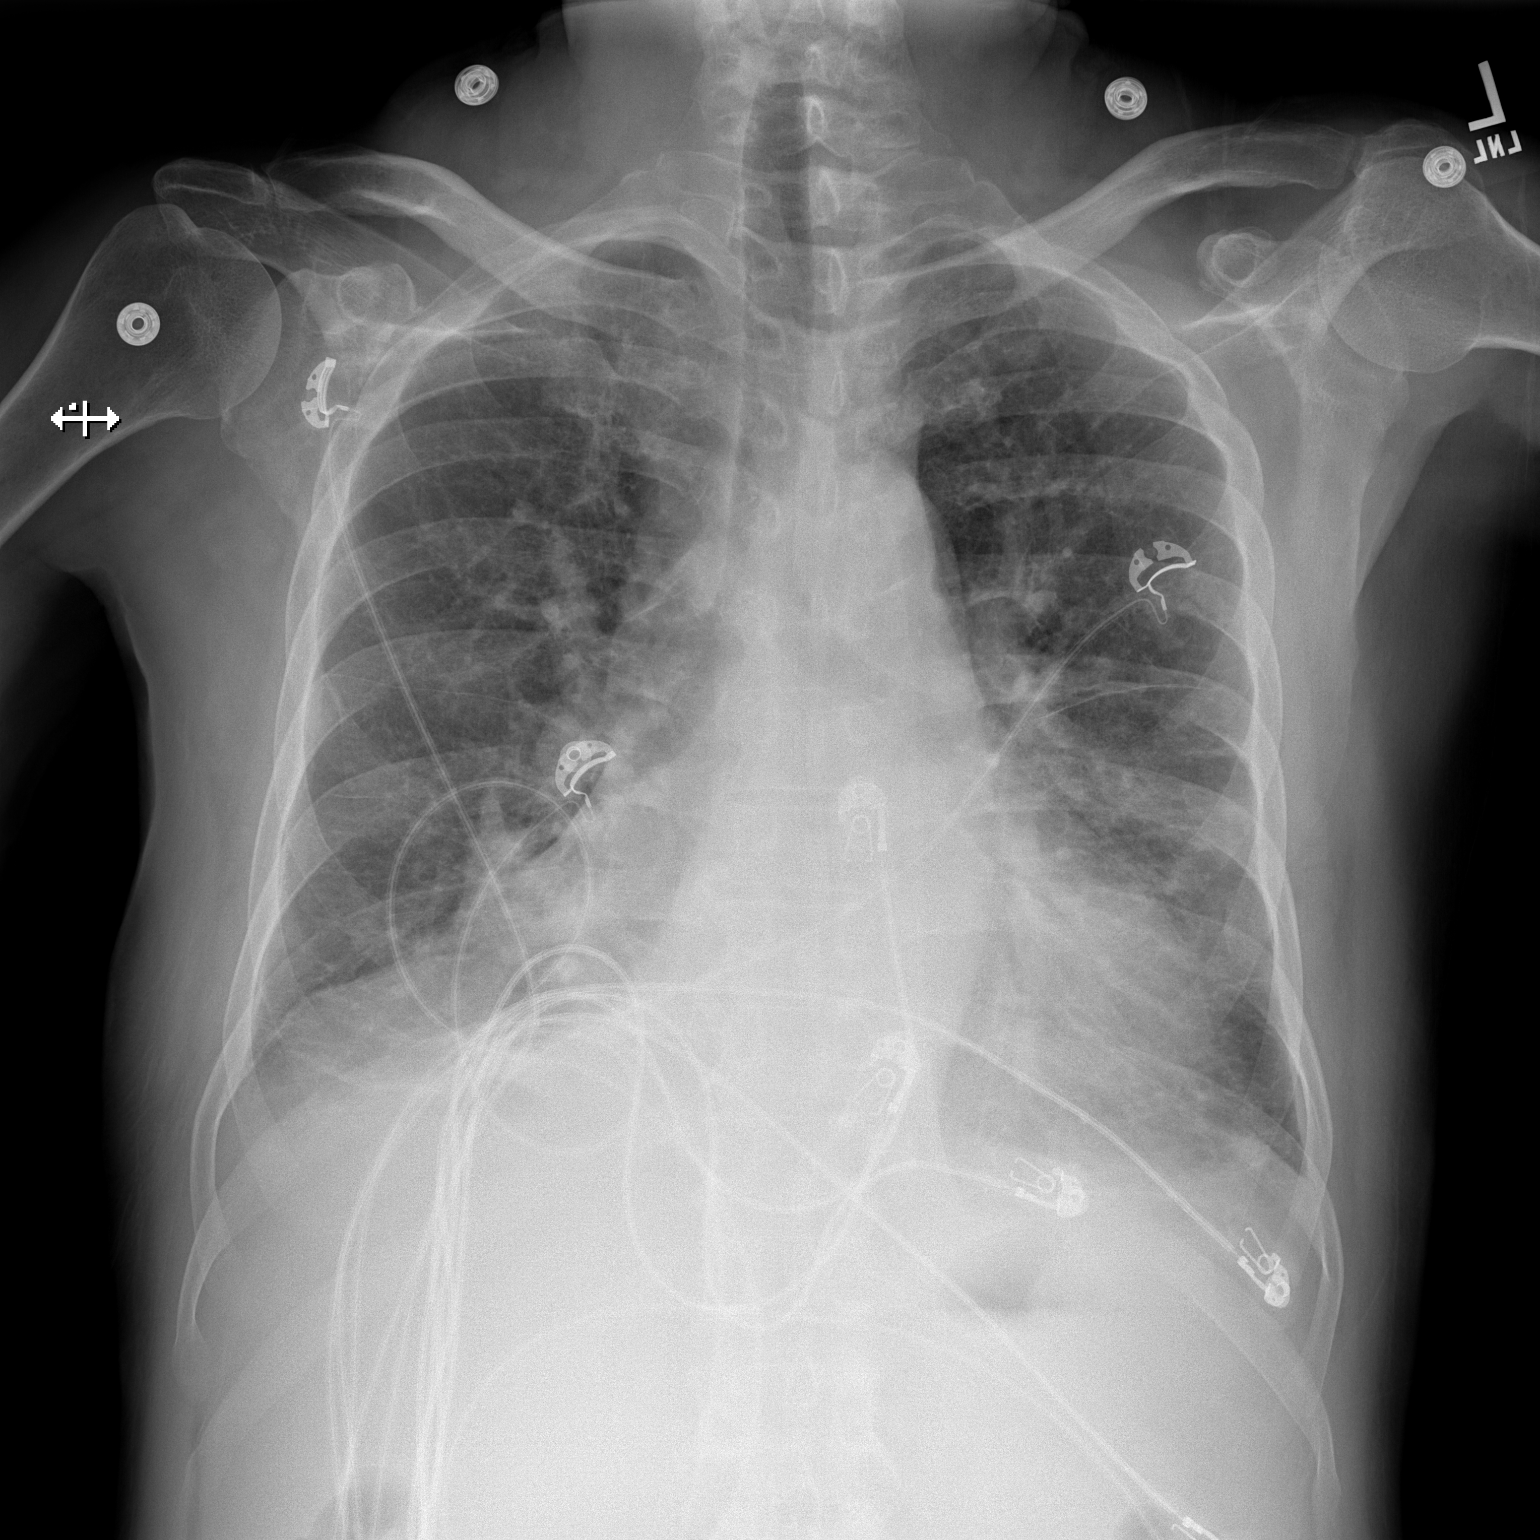

[w chest lat]
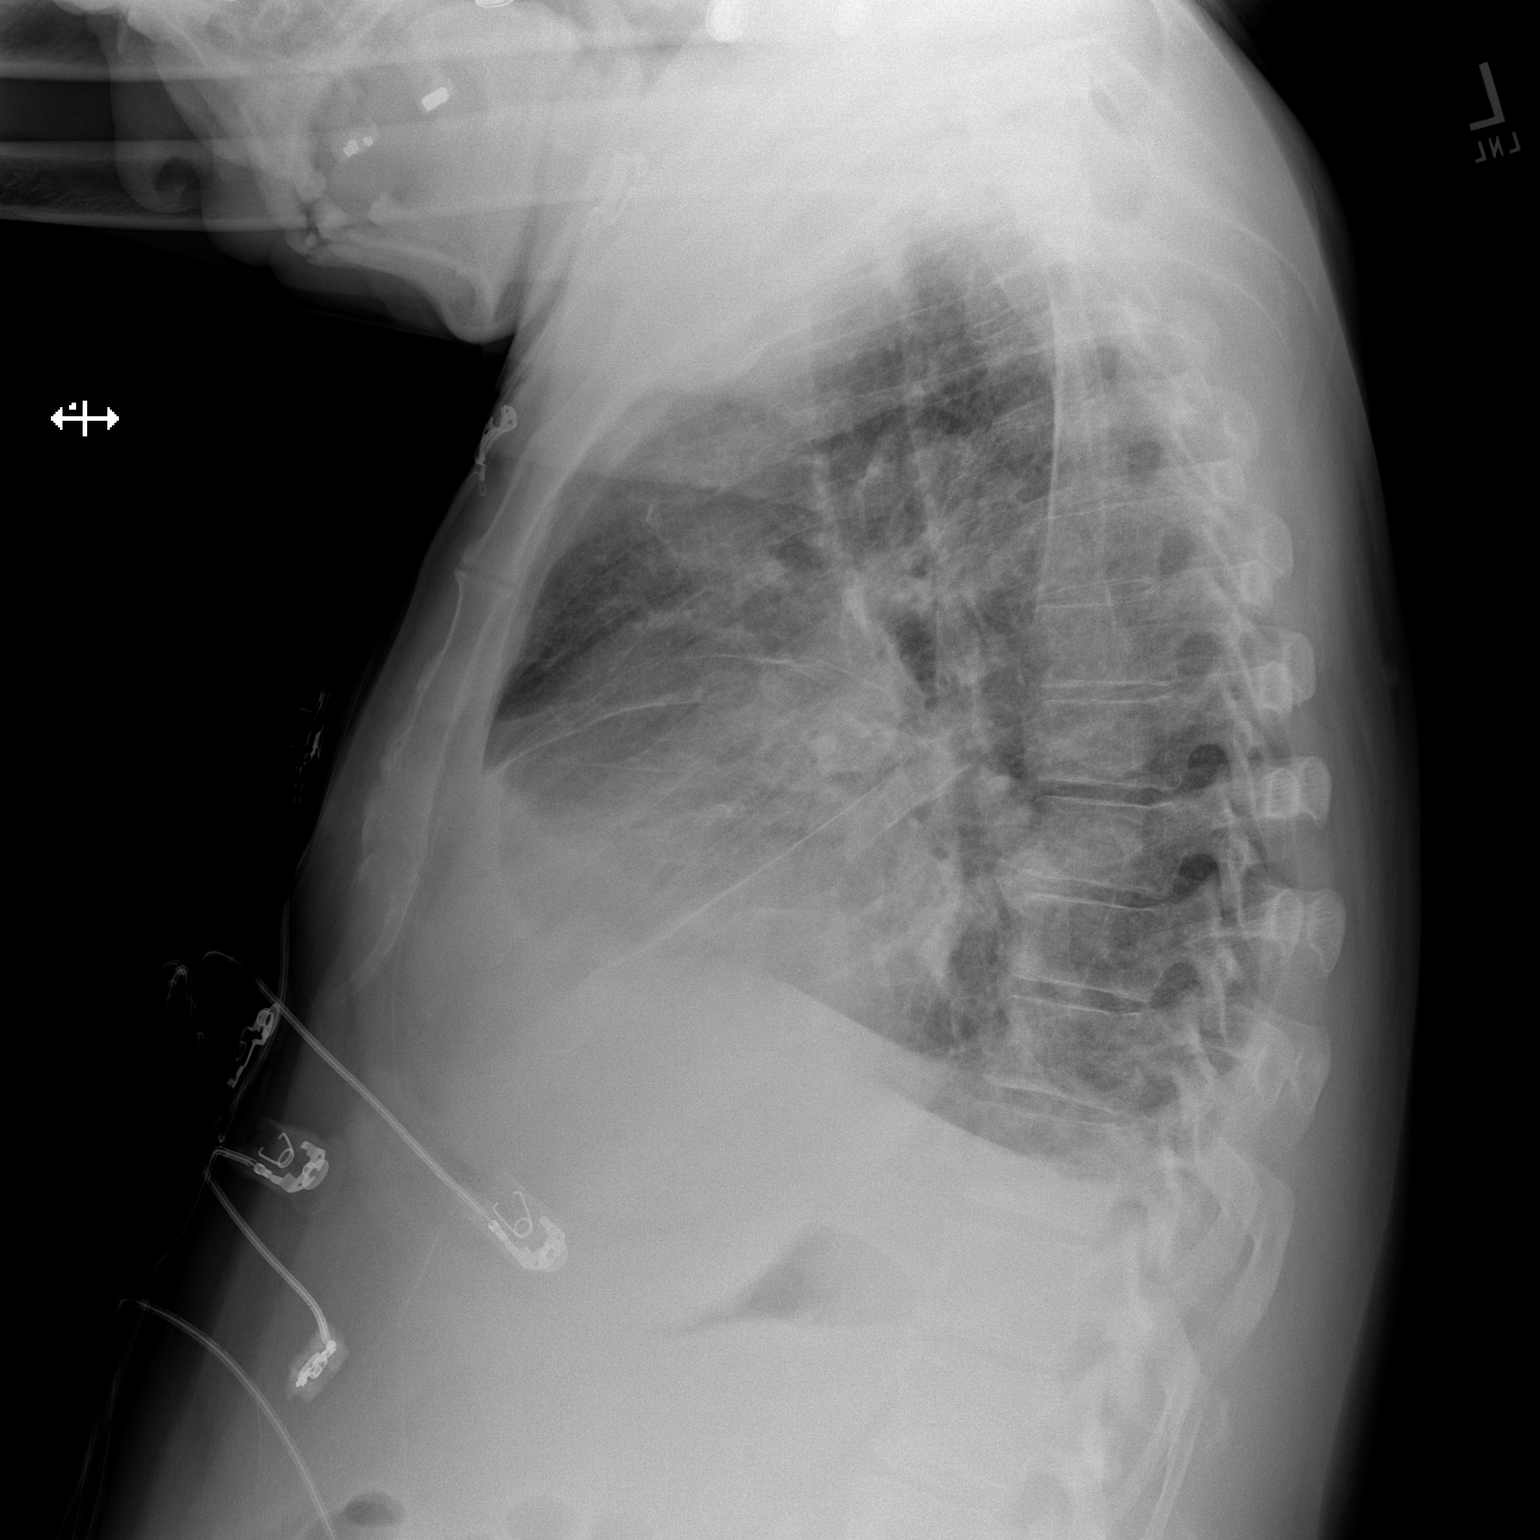

[2 of 2 positions shown; findings below may reference images not displayed]

FINDINGS: Stable enlargement of the cardiac silhouette. Right basilar
densities. Interstitial densities in the mid and lower left lung.
Question small pleural effusions. There may be a small amount of
fluid or thickening in the lung fissures.
IMPRESSION: 1. Right basilar densities. Findings could represent a combination
atelectasis and small amount of pleural fluid.
2. Slightly prominent interstitial densities in left lung could
represent atelectasis or mild asymmetric edema.
3. Stable cardiomegaly.

## 2020-02-24 MED ORDER — TORSEMIDE 20 MG PO TABS: 20.0000 mg | ORAL_TABLET | Freq: Every day | ORAL | 0 refills | Status: DC

## 2020-02-24 MED ORDER — ACETAMINOPHEN-CODEINE #3 300-30 MG PO TABS: 1.0000 | ORAL_TABLET | Freq: Four times a day (QID) | ORAL | 0 refills | Status: AC | PRN

## 2020-02-24 MED ORDER — SPIRONOLACTONE 25 MG PO TABS
25.0000 mg | ORAL_TABLET | Freq: Every day | ORAL | Status: DC
  Administered 2020-02-24: 25 mg via ORAL
  Filled 2020-02-24: qty 1

## 2020-02-24 MED ORDER — TORSEMIDE 20 MG PO TABS
40.0000 mg | ORAL_TABLET | Freq: Two times a day (BID) | ORAL | Status: DC
  Administered 2020-02-24: 40 mg via ORAL
  Filled 2020-02-24 (×2): qty 2

## 2020-02-24 MED ORDER — APIXABAN 5 MG PO TABS
5.0000 mg | ORAL_TABLET | Freq: Two times a day (BID) | ORAL | Status: DC
  Administered 2020-02-24: 5 mg via ORAL
  Filled 2020-02-24: qty 1

## 2020-02-24 MED ORDER — TORSEMIDE 20 MG PO TABS: 20.0000 mg | ORAL_TABLET | Freq: Every day | ORAL | Status: DC

## 2020-02-24 NOTE — Evaluation (Signed)
Physical Therapy Evaluation Patient Details Name: Brad [name] MRN: [id number] DOB: 29-Oct-1961 Today's Date: 02/24/2020   History of Present Illness  59 y.o. male admitted with multiple falls at home, Suspected  EtOH / dehydration / water intoxication to blame.    medical history significant of EtOH abuse, cirrhosis, HCV, chronic hyponatremia, A.Fib on eliquis, HTN, HFrEF with EF 25-30%  Clinical Impression  Patient evaluated by Physical Therapy with no further acute PT needs identified. All education has been completed and the patient has no further questions.  See below for any follow-up Physical Therapy or equipment needs. PT is signing off. Thank you for this referral.     Follow Up Recommendations No PT follow up    Equipment Recommendations  Cane    Recommendations for Other Services       Precautions / Restrictions Precautions Precautions: Fall Restrictions Weight Bearing Restrictions: No      Mobility  Bed Mobility Overal bed mobility: Needs Assistance Bed Mobility: Supine to Sit;Sit to Supine     Supine to sit: Independent Sit to supine: Independent      Transfers Overall transfer level: Needs assistance Equipment used: None Transfers: Sit to/from Stand Sit to Stand: Modified independent (Device/Increase time)            Ambulation/Gait Ambulation/Gait assistance: Supervision;Min guard Gait Distance (Feet): 400 Feet Assistive device: None Gait Pattern/deviations: Step-through pattern;Decreased stride length;Narrow base of support     General Gait Details: cues for full knee extension. intermittent unsteadiness however no overt LOB  Stairs            Wheelchair Mobility    Modified [name] (Stroke Patients Only)       Balance Overall balance assessment: Needs assistance Sitting-balance support: No upper extremity supported;Feet supported Sitting balance-Leahy Scale: Normal       Standing balance-Leahy Scale: Fair Standing  balance comment: at least fair, not tested to moderate perturbations                             Pertinent Vitals/Pain      Home Living Family/patient expects to be discharged to:: Private residence     Type of Home: Apartment Home Access: Level entry     Home Layout: One level        Prior Function Level of Independence: Independent         Comments: pt reports occasionally requiring cane for ambulation, but otherwise is independent     Hand Dominance        Extremity/Trunk Assessment   Upper Extremity Assessment Upper Extremity Assessment: Overall WFL for tasks assessed    Lower Extremity Assessment Lower Extremity Assessment: Overall WFL for tasks assessed;RLE deficits/detail RLE Deficits / Details: right ankle difficulty per pt. limited pf, inversion and eversion; knee and hip grossly WFL       Communication   Communication: No difficulties  Cognition Arousal/Alertness: Awake/alert Behavior During Therapy: WFL for tasks assessed/performed Overall Cognitive Status: Within Functional Limits for tasks assessed                                        General Comments      Exercises     Assessment/Plan    PT Assessment Patent does not need any further PT services  PT Problem List         PT Treatment  Interventions      PT Goals (Current goals can be found in the Care Plan section)  Acute Rehab PT Goals PT Goal Formulation: All assessment and education complete, DC therapy    Frequency     Barriers to discharge        Co-evaluation               AM-PAC PT "6 Clicks" Mobility  Outcome Measure Help needed turning from your back to your side while in a flat bed without using bedrails?: None Help needed moving from lying on your back to sitting on the side of a flat bed without using bedrails?: None Help needed moving to and from a bed to a chair (including a wheelchair)?: None Help needed standing up from a  chair using your arms (e.g., wheelchair or bedside chair)?: None Help needed to walk in hospital room?: None Help needed climbing 3-5 steps with a railing? : A Little 6 Click Score: 23    End of Session   Activity Tolerance: Patient tolerated treatment well Patient left: with call bell/phone within reach;in bed   PT Visit Diagnosis: Difficulty in walking, not elsewhere classified (R26.2)    Time: [id number] PT Time Calculation (min) (ACUTE ONLY): 13 min   Charges:   PT Evaluation $PT Eval Low Complexity: 1 Low          Trell Secrist, PT   Acute Rehab Dept [facility]): [id number]   02/24/2020   [facility] 02/24/2020, 12:34 PM

## 2020-02-24 NOTE — Discharge Summary (Addendum)
Physician Discharge Summary  Brad [name] [id number] DOB: January 11, 1961 DOA: 02/22/2020  PCP: Charlott Rakes, MD  Admit date: 02/22/2020 Discharge date: 02/24/2020  Admitted From: home Disposition:  home  Recommendations for Outpatient Follow-up:  1. Follow up with PCP in 1-2 weeks 2. Follow up with CHF clinic in 11 days as scheduled   Home Health: none Equipment/Devices: cane  Discharge Condition: stable CODE STATUS: Full code Diet recommendation: low sodium, heart healthy  HPI: Per admitting MD, Brad [name] is a 59 y.o. male with medical history significant of EtOH abuse, cirrhosis, HCV, chronic hyponatremia, A.Fib on eliquis, HTN, HFrEF with EF 25-30%. Pt presents to the ED with c/o multiple falls today including hitting head 3 times.  H/o similar falls and seen in ED for such on 2/13.  Is taking all meds including eliquis for A.Fib, no missed doses. ED Course: Sodium 120, usually runs 125-129.  EDP wants obs admit for hyponatremia. CT head without acute findings.  Hospital Course / Discharge diagnoses: Principal Problem Acute on chronic hyponatremia -Patient sodium ranges between upper 120s and lower 130s.  He was found to be significantly dehydrated on admission, his diuretics have been held and he was placed on IV fluids.  His sodium gradually normalized over couple of days and return to his baseline, 132 on discharge.  Patient had significant poor p.o. intake at home and mainly drinking beer, he is able to tolerate a regular diet, asked to go home and will be discharged in stable condition.  Given how dehydrated patient was I have discussed his case with heart failure team, and his diuretics have been changed from 80 mg torsemide in a.m. and 60 mg of torsemide in p.m. to 60/40.  Patient was advised on low-sodium diet and heart healthy diet with complete avoidance of alcohol or other drugs.  Urine drug screen was negative.  Active Problems Chronic systolic CHF -dehydrated  on admission, received fluids, clinically euvolemic, resume home medications.  He will have follow-up with heart failure team in 11 days as scheduled.  Will probably need repeat blood work at that time.  Most recent EF 25-30% in August 2020. Recurrent falls, FTT, syncope -with hydration his weakness is improved, he is able to walk with physical therapy, ambulated several 100 feet in the hallway without home health needs.  He was monitored on telemetry, and other than occasional PVCs there were no significant arrhythmias.  He is being considered for an ICD as an outpatient but he is to be drug-free which he currently appears to be.  I have discontinued his Ambien.  CT scan on admission showed a small right scalp hematoma but no other acute findings.  Had a mild headache which improved with Tylenol No. 3 Alcohol abuse -Patient reports heavy drinking on a daily basis, mainly beer, reports that his father passed away 2 weeks ago and has been drinking more due to that reason.  He was maintained on CIWA however exhibited no withdrawal, and he tells me he never gets any withdrawal symptoms.  Social worker has been consulted. Paroxysmal A. Fib -Continue rate control with digoxin, amiodarone, anticoagulated with Eliquis. Type 2 diabetes mellitus -resume home medications Chronic kidney disease 3a -Patient's creatinine ranges from 1.0-1.9 but mostly within 1.3-1.6.  Currently at baseline.  Discharge Instructions   Allergies as of 02/24/2020      Reactions   Angiotensin Receptor Blockers Anaphylaxis, Other (See Comments)   (Angioedema also with Lisinopril, therefore ARB's are contraindicated)   Lisinopril Anaphylaxis, Other (  See Comments)   Throat swells   Pamelor [nortriptyline Hcl] Anaphylaxis, Swelling   Throat swells      Medication List    STOP taking these medications   zolpidem 5 MG tablet Commonly known as: AMBIEN     TAKE these medications   Accu-Chek Aviva Plus test strip Generic drug:  glucose blood USE AS DIRECTED THREE TIMES DAILY   acetaminophen-codeine 300-30 MG tablet Commonly known as: TYLENOL #3 Take 1 tablet by mouth every 6 (six) hours as needed for up to 3 days for moderate pain.   albuterol 108 (90 Base) MCG/ACT inhaler Commonly known as: VENTOLIN HFA Inhale 2 puffs into the lungs every 6 (six) hours as needed for shortness of breath.   amiodarone 200 MG tablet Commonly known as: PACERONE Take 200 mg by mouth daily.   amitriptyline 10 MG tablet Commonly known as: ELAVIL Take 20 mg by mouth at bedtime.   apixaban 5 MG Tabs tablet Commonly known as: Eliquis Take 1 tablet (5 mg total) by mouth 2 (two) times daily.   atorvastatin 20 MG tablet Commonly known as: LIPITOR TAKE 1 TABLET (20 MG TOTAL) BY MOUTH DAILY AT 6 PM.   dicyclomine 20 MG tablet Commonly known as: BENTYL Take 1 tablet (20 mg total) by mouth 2 (two) times daily as needed for spasms.   digoxin 0.125 MG tablet Commonly known as: LANOXIN Take 1 tablet (0.125 mg total) by mouth daily.   DULoxetine 60 MG capsule Commonly known as: CYMBALTA Take 1 capsule (60 mg total) by mouth daily.   gabapentin 300 MG capsule Commonly known as: NEURONTIN Take 2 capsules (600 mg total) by mouth 2 (two) times daily.   isosorbide-hydrALAZINE 20-37.5 MG tablet Commonly known as: BIDIL Take 1 tablet by mouth 3 (three) times daily.   mirtazapine 30 MG tablet Commonly known as: REMERON Take 30 mg by mouth at bedtime.   ondansetron 4 MG tablet Commonly known as: ZOFRAN Take 1 tablet (4 mg total) by mouth every 8 (eight) hours as needed.   potassium chloride SA 20 MEQ tablet Commonly known as: KLOR-CON Take 40 mEq by mouth daily.   spironolactone 25 MG tablet Commonly known as: ALDACTONE Take 1 tablet (25 mg total) by mouth daily.   torsemide 20 MG tablet Commonly known as: DEMADEX Take 1 tablet (20 mg total) by mouth daily. Take 3 tabs (60mg ) every morning and 2 tabs (40mg ) every  evening What changed: additional instructions            Durable Medical Equipment  (From admission, onward)         Start     Ordered   02/23/20 1436  For home use only DME Cane  Once     02/23/20 1435         Follow-up Information    Charlott Rakes, MD. Call on 03/12/2020.   Specialty: Family Medicine Why: Monday at 3:30 for your hospital follow up appointment Contact information: [address] [place] [address] [phone]           Consultations:  None   Procedures/Studies:  CT Head Wo Contrast  Result Date: 02/22/2020 CLINICAL DATA:  History of recent falls with poor oral intake EXAM: CT HEAD WITHOUT CONTRAST TECHNIQUE: Contiguous axial images were obtained from the base of the skull through the vertex without intravenous contrast. COMPARISON:  02/04/2020 FINDINGS: Brain: Mild atrophic changes and chronic white matter ischemic changes are seen. No findings to suggest acute hemorrhage, acute infarction  or space-occupying mass lesion are noted. Vascular: No hyperdense vessel or unexpected calcification. Skull: Normal. Negative for fracture or focal lesion. Sinuses/Orbits: No acute finding. Other: Mild scalp hematoma is noted in the right posterior parietal region near the vertex. Multiple small radiopaque densities are noted likely related to foreign bodies. These are decreased when compared with the prior exam. IMPRESSION: Chronic atrophic and ischemic changes are noted. Small right scalp hematoma as described with small foreign bodies within. These are decreased in the interval from the prior exam. No acute abnormality is noted. Electronically Signed   By: Inez Catalina M.D.   On: 02/22/2020 20:12   CT Head Wo Contrast  Result Date: 02/04/2020 CLINICAL DATA:  Tripped over curb and struck back of head on concrete, ethanol on board, on Eliquis EXAM: CT HEAD WITHOUT CONTRAST CT CERVICAL SPINE WITHOUT CONTRAST TECHNIQUE: Multidetector CT imaging of the head  and cervical spine was performed following the standard protocol without intravenous contrast. Multiplanar CT image reconstructions of the cervical spine were also generated. COMPARISON:  CT 06/06/2019, CT 08/22/2019 FINDINGS: CT HEAD FINDINGS Brain: No evidence of acute infarction, hemorrhage, hydrocephalus, extra-axial collection or mass lesion/mass effect. Symmetric prominence of the ventricles, cisterns and sulci compatible with parenchymal volume loss. Patchy areas of white matter hypoattenuation are most compatible with chronic microvascular angiopathy. Vascular: Atherosclerotic calcification of the carotid siphons. No hyperdense vessel. Skull: Right parietal scalp swelling and skin thickening with radiodense material, possibly foreign body within the laceration site. Overlying bandaging material is present. No subjacent calvarial fracture or other acute or suspicious osseous abnormality. Sinuses/Orbits: Minimal nodular thickening in the anterior left maxillary sinus. Remaining paranasal sinuses are predominantly clear. Middle ear cavities are clear. Debris noted in the external auditory canals. Included orbital structures are unremarkable. Other: Remote bilateral nasal bone fractures. Few carious lesions noted in the maxillary dentition. CT CERVICAL SPINE FINDINGS Alignment: Straightening and slight reversal of the normal cervical lordosis at C4-5 I likely degenerative basis without traumatic listhesis. No abnormally widened, perched or jumped facets. Normal alignment of the craniocervical and atlantoaxial articulations. Skull base and vertebrae: No acute fracture. No primary bone lesion or focal pathologic process. Soft tissues and spinal canal: No pre or paravertebral fluid or swelling. No visible canal hematoma. Disc levels: Multilevel cervical spondylitic changes present throughout the cervical spine most pronounced at C4-5 and C5-6 where disc osteophyte complexes result in mild canal stenosis. Additional  uncinate spurring and facet hypertrophic changes at these levels result in mild bilateral neural foraminal narrowing as well. Upper chest: Some interlobular septal thickening in the lung apices may reflect mild edema. No other acute abnormality in the upper chest or imaged lung apices. Other: Calcifications present in the proximal great vessels and cervical carotids. Small amount of gas in the left jugular vein may be related to intravenous access. IMPRESSION: 1. No acute intracranial abnormality. 2. Right parietal scalp swelling and skin thickening with radiodense material, possibly foreign body within the laceration site. No subjacent calvarial fracture. 3. No acute fracture or traumatic listhesis of the cervical spine. 4. Multilevel cervical spondylitic changes most pronounced at C4-5 and C5-6 where disc osteophyte complexes result in mild canal stenosis and mild bilateral neural foraminal narrowing. Electronically Signed   By: Lovena Le M.D.   On: 02/04/2020 02:57   CT Cervical Spine Wo Contrast  Result Date: 02/04/2020 CLINICAL DATA:  Tripped over curb and struck back of head on concrete, ethanol on board, on Eliquis EXAM: CT HEAD WITHOUT CONTRAST CT CERVICAL  SPINE WITHOUT CONTRAST TECHNIQUE: Multidetector CT imaging of the head and cervical spine was performed following the standard protocol without intravenous contrast. Multiplanar CT image reconstructions of the cervical spine were also generated. COMPARISON:  CT 06/06/2019, CT 08/22/2019 FINDINGS: CT HEAD FINDINGS Brain: No evidence of acute infarction, hemorrhage, hydrocephalus, extra-axial collection or mass lesion/mass effect. Symmetric prominence of the ventricles, cisterns and sulci compatible with parenchymal volume loss. Patchy areas of white matter hypoattenuation are most compatible with chronic microvascular angiopathy. Vascular: Atherosclerotic calcification of the carotid siphons. No hyperdense vessel. Skull: Right parietal scalp swelling  and skin thickening with radiodense material, possibly foreign body within the laceration site. Overlying bandaging material is present. No subjacent calvarial fracture or other acute or suspicious osseous abnormality. Sinuses/Orbits: Minimal nodular thickening in the anterior left maxillary sinus. Remaining paranasal sinuses are predominantly clear. Middle ear cavities are clear. Debris noted in the external auditory canals. Included orbital structures are unremarkable. Other: Remote bilateral nasal bone fractures. Few carious lesions noted in the maxillary dentition. CT CERVICAL SPINE FINDINGS Alignment: Straightening and slight reversal of the normal cervical lordosis at C4-5 I likely degenerative basis without traumatic listhesis. No abnormally widened, perched or jumped facets. Normal alignment of the craniocervical and atlantoaxial articulations. Skull base and vertebrae: No acute fracture. No primary bone lesion or focal pathologic process. Soft tissues and spinal canal: No pre or paravertebral fluid or swelling. No visible canal hematoma. Disc levels: Multilevel cervical spondylitic changes present throughout the cervical spine most pronounced at C4-5 and C5-6 where disc osteophyte complexes result in mild canal stenosis. Additional uncinate spurring and facet hypertrophic changes at these levels result in mild bilateral neural foraminal narrowing as well. Upper chest: Some interlobular septal thickening in the lung apices may reflect mild edema. No other acute abnormality in the upper chest or imaged lung apices. Other: Calcifications present in the proximal great vessels and cervical carotids. Small amount of gas in the left jugular vein may be related to intravenous access. IMPRESSION: 1. No acute intracranial abnormality. 2. Right parietal scalp swelling and skin thickening with radiodense material, possibly foreign body within the laceration site. No subjacent calvarial fracture. 3. No acute fracture or  traumatic listhesis of the cervical spine. 4. Multilevel cervical spondylitic changes most pronounced at C4-5 and C5-6 where disc osteophyte complexes result in mild canal stenosis and mild bilateral neural foraminal narrowing. Electronically Signed   By: Lovena Le M.D.   On: 02/04/2020 02:57   DG Chest Portable 1 View  Result Date: 02/22/2020 CLINICAL DATA:  Syncope EXAM: PORTABLE CHEST 1 VIEW COMPARISON:  10/31/2019 FINDINGS: Cardiomegaly with mild central congestion. No consolidation, pleural effusion, or pneumothorax. IMPRESSION: No active disease.  Cardiomegaly with mild central congestion Electronically Signed   By: Donavan Foil M.D.   On: 02/22/2020 19:51     Subjective: - no chest pain, shortness of breath, no abdominal pain, nausea or vomiting.   Discharge Exam: BP (!) 136/93   Pulse 76   Temp 97.6 F (36.4 C) (Oral)   Resp 18   Ht 5\' 11"  (1.803 m)   Wt 76.3 kg   SpO2 98%   BMI 23.46 kg/m   General: Pt is alert, awake, not in acute distress Cardiovascular: RRR, S1/S2 +, no rubs, no gallops Respiratory: CTA bilaterally, no wheezing, no rhonchi Abdominal: Soft, NT, ND, bowel sounds + Extremities: no edema, no cyanosis   The results of significant diagnostics from this hospitalization (including imaging, microbiology, ancillary and laboratory) are listed below for  reference.     Microbiology: Recent Results (from the past 240 hour(s))  SARS CORONAVIRUS 2 (TAT 6-24 HRS) Nasopharyngeal Nasopharyngeal Swab     Status: None   Collection Time: 02/22/20 10:29 PM   Specimen: Nasopharyngeal Swab  Result Value Ref Range Status   SARS Coronavirus 2 NEGATIVE NEGATIVE Final    Comment: (NOTE) SARS-CoV-2 target nucleic acids are NOT DETECTED. The SARS-CoV-2 RNA is generally detectable in upper and lower respiratory specimens during the acute phase of infection. Negative results do not preclude SARS-CoV-2 infection, do not rule out co-infections with other pathogens, and  should not be used as the sole basis for treatment or other patient management decisions. Negative results must be combined with clinical observations, patient history, and epidemiological information. The expected result is Negative. Fact Sheet for Patients: SugarRoll.be Fact Sheet for Healthcare Providers: https://www.woods-mathews.com/ This test is not yet approved or cleared by the [place] FDA and  has been authorized for detection and/or diagnosis of SARS-CoV-2 by FDA under an Emergency Use Authorization (EUA). This EUA will remain  in effect (meaning this test can be used) for the duration of the COVID-19 declaration under Section 56 4(b)(1) of the Act, 21 U.S.C. section 360bbb-3(b)(1), unless the authorization is terminated or revoked sooner. Performed at [facility], [place] [address], [place], [place] [address]      Labs: Basic Metabolic Panel: Recent Labs  Lab 02/21/20 0425 02/22/20 2045 02/23/20 0005 02/23/20 0357 02/23/20 1710 02/24/20 0622  NA 127* 120*  --  126* 132* 132*  K 4.1 4.3  --  4.0 3.9 4.1  CL 89* 85*  --  88* 97* 101  CO2 25 21*  --  27 28 23   GLUCOSE 110* 159*  --  135* 139* 163*  BUN 18 12  --  16 18 21*  CREATININE 1.56* 1.57*  --  1.60* 1.56* 1.50*  CALCIUM 8.9 8.3*  --  8.6* 8.6* 8.6*  MG  --   --  1.7  --   --   --   PHOS  --   --  3.3  --   --   --    Liver Function Tests: Recent Labs  Lab 02/21/20 0425 02/24/20 0622  AST 90* 43*  ALT 46* 31  ALKPHOS 75 90  BILITOT 0.8 0.6  PROT 8.3* 7.0  ALBUMIN 3.8 3.2*   CBC: Recent Labs  Lab 02/21/20 0425 02/22/20 2017 02/24/20 0622  WBC 1.6* 2.9* 3.4*  NEUTROABS 0.9* 2.0  --   HGB 13.5 13.2 12.4*  HCT 38.8* 37.1* 37.1*  MCV 90.4 89.4 94.9  PLT 206 193 124*   CBG: Recent Labs  Lab 02/23/20 1222 02/23/20 1724 02/23/20 2148 02/24/20 0737 02/24/20 1155  GLUCAP 185* 135* 128* 179* 120*   Hgb A1c Recent Labs    02/23/20 0017   HGBA1C 6.8*   Lipid Profile No results for input(s): CHOL, HDL, LDLCALC, TRIG, CHOLHDL, LDLDIRECT in the last 72 hours. Thyroid function studies No results for input(s): TSH, T4TOTAL, T3FREE, THYROIDAB in the last 72 hours.  Invalid input(s): FREET3 Urinalysis    Component Value Date/Time   COLORURINE YELLOW 02/21/2020 0425   APPEARANCEUR CLEAR 02/21/2020 0425   LABSPEC 1.005 02/21/2020 0425   PHURINE 6.0 02/21/2020 0425   GLUCOSEU NEGATIVE 02/21/2020 0425   HGBUR SMALL (A) 02/21/2020 0425   BILIRUBINUR NEGATIVE 02/21/2020 0425   BILIRUBINUR neg 05/08/2017 1503   KETONESUR NEGATIVE 02/21/2020 0425   PROTEINUR 100 (A) 02/21/2020 0425  UROBILINOGEN 0.2 05/08/2017 1503   UROBILINOGEN 1.0 04/26/2012 1328   NITRITE NEGATIVE 02/21/2020 0425   LEUKOCYTESUR NEGATIVE 02/21/2020 0425    FURTHER DISCHARGE INSTRUCTIONS:   Get Medicines reviewed and adjusted: Please take all your medications with you for your next visit with your Primary MD   Laboratory/radiological data: Please request your Primary MD to go over all hospital tests and procedure/radiological results at the follow up, please ask your Primary MD to get all Hospital records sent to his/her office.   In some cases, they will be blood work, cultures and biopsy results pending at the time of your discharge. Please request that your primary care M.D. goes through all the records of your hospital data and follows up on these results.   Also Note the following: If you experience worsening of your admission symptoms, develop shortness of breath, life threatening emergency, suicidal or homicidal thoughts you must seek medical attention immediately by calling 911 or calling your MD immediately  if symptoms less severe.   You must read complete instructions/literature along with all the possible adverse reactions/side effects for all the Medicines you take and that have been prescribed to you. Take any new Medicines after you have  completely understood and accpet all the possible adverse reactions/side effects.    Do not drive when taking Pain medications or sleeping medications (Benzodaizepines)   Do not take more than prescribed Pain, Sleep and Anxiety Medications. It is not advisable to combine anxiety,sleep and pain medications without talking with your primary care practitioner   Special Instructions: If you have smoked or chewed Tobacco  in the last 2 yrs please stop smoking, stop any regular Alcohol  and or any Recreational drug use.   Wear Seat belts while driving.   Please note: You were cared for by a hospitalist during your hospital stay. Once you are discharged, your primary care physician will handle any further medical issues. Please note that NO REFILLS for any discharge medications will be authorized once you are discharged, as it is imperative that you return to your primary care physician (or establish a relationship with a primary care physician if you do not have one) for your post hospital discharge needs so that they can reassess your need for medications and monitor your lab values.  Time coordinating discharge: 40 minutes  SIGNED:  Marzetta Board, MD, PhD 02/24/2020, 1:08 PM

## 2020-02-27 ENCOUNTER — Telehealth: Payer: Self-pay

## 2020-02-27 IMAGING — US US ABDOMEN COMPLETE
1 series · 13 of 25 positions shown · non-contrast
Comparison: CT abdomen and pelvis 08/16/2018

CLINICAL DATA: Abdominal distention

EXAM:
ABDOMEN ULTRASOUND COMPLETE

[Series 1: us abdomen complete · 0.22mm/px · 13 of 113 slices shown]
[im 1/113]
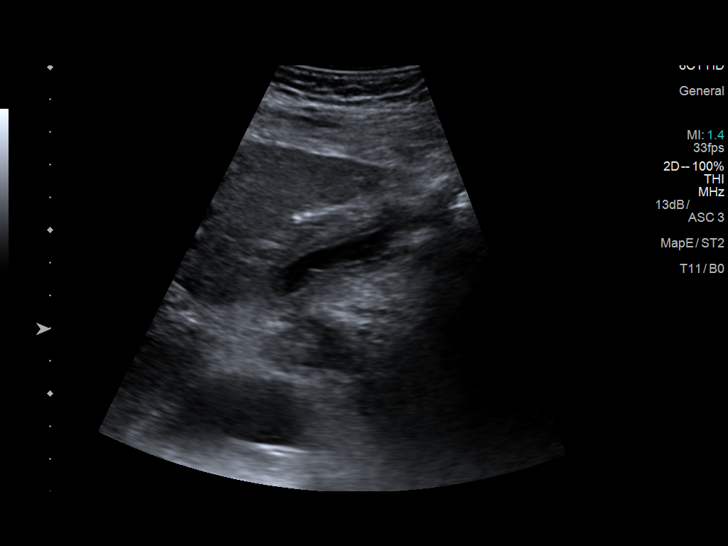
[im 10/113]
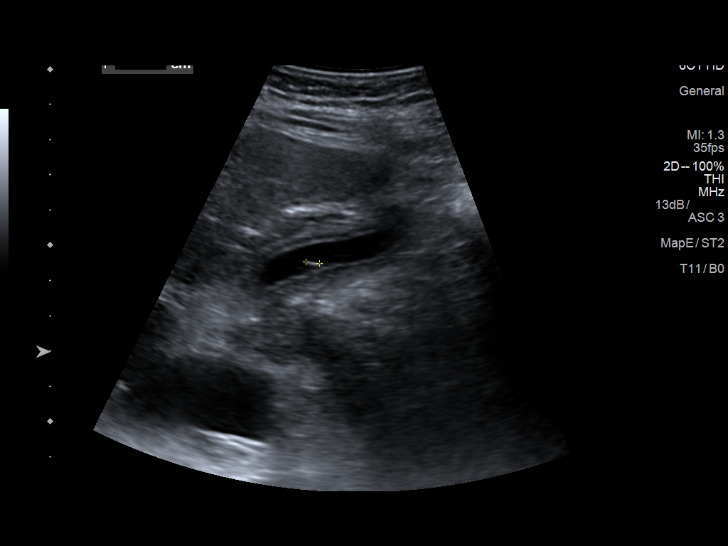
[im 19/113]
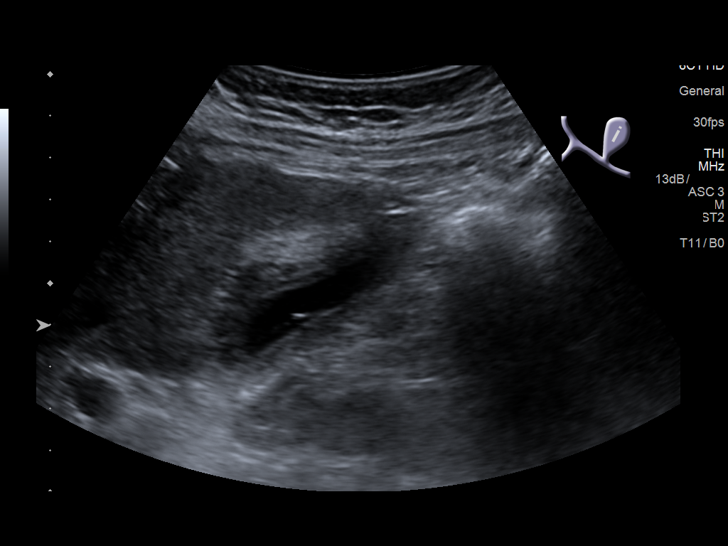
[im 29/113]
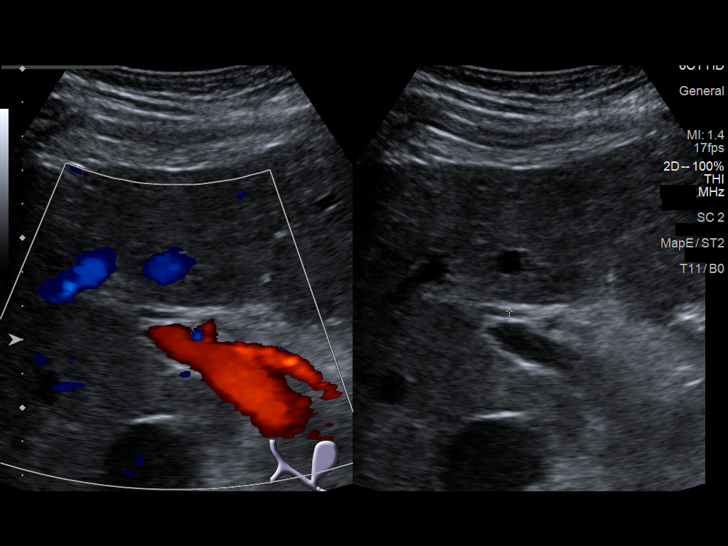
[im 38/113]
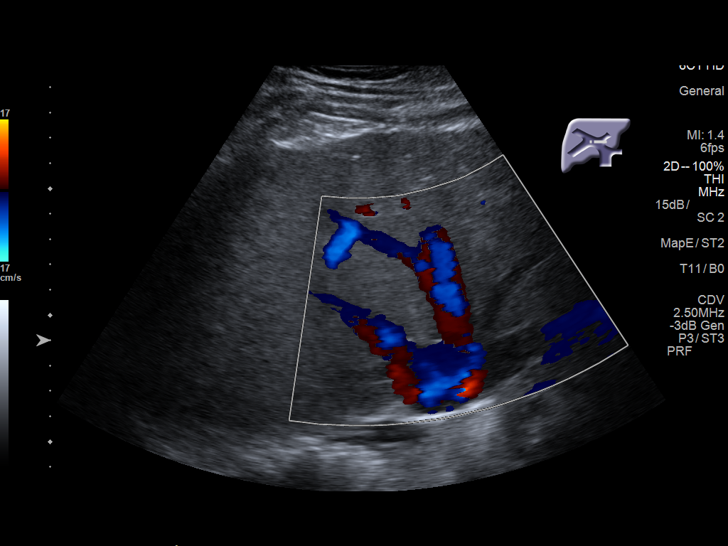
[im 47/113]
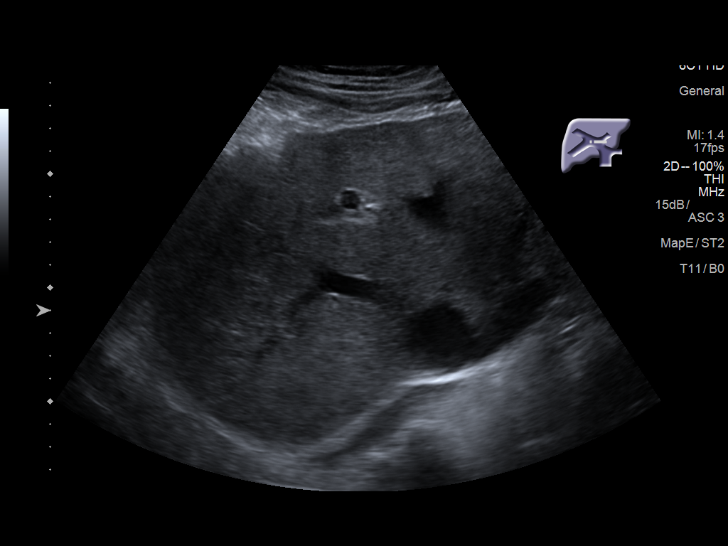
[im 57/113]
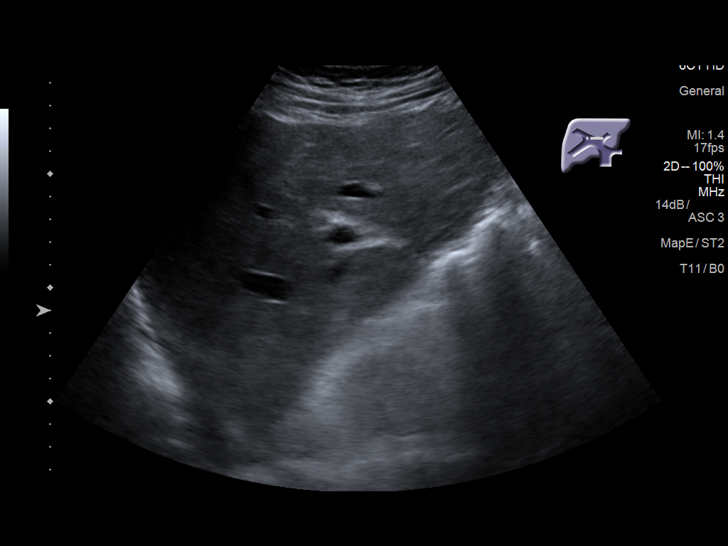
[im 66/113]
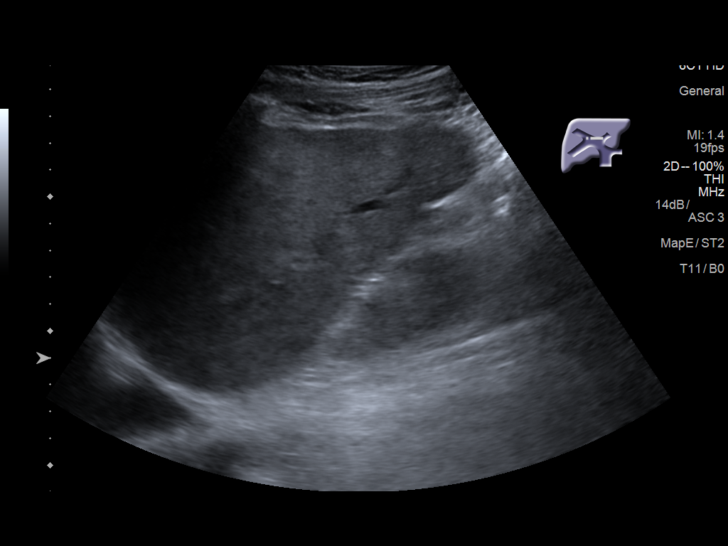
[im 75/113]
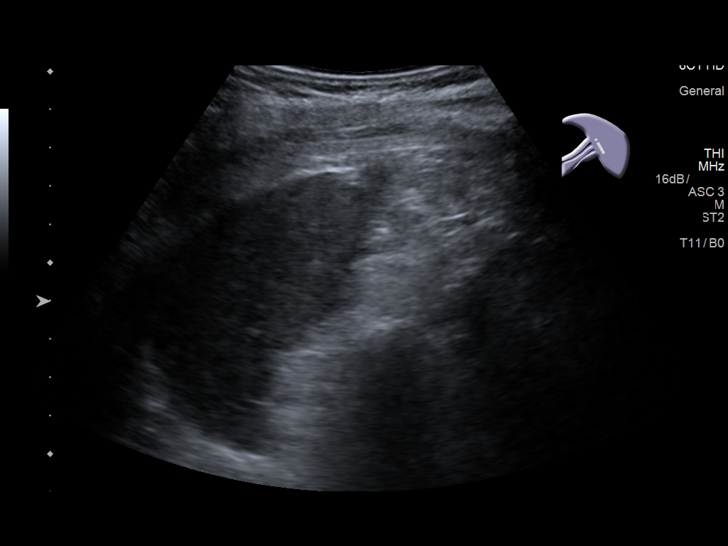
[im 85/113]
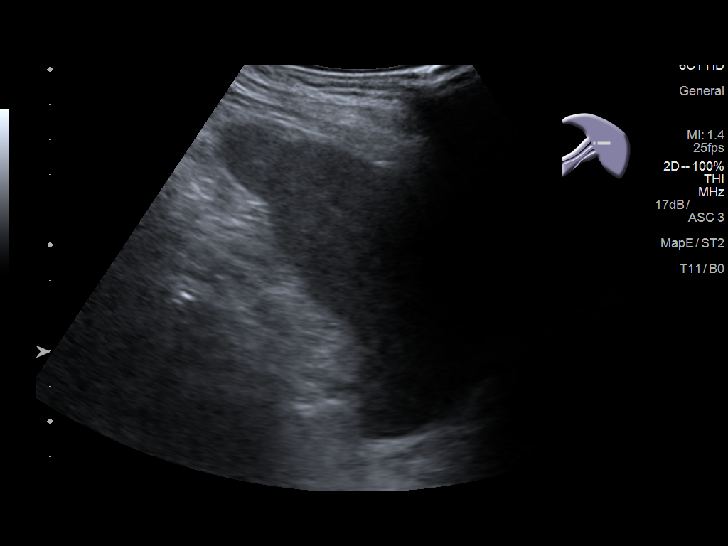
[im 94/113]
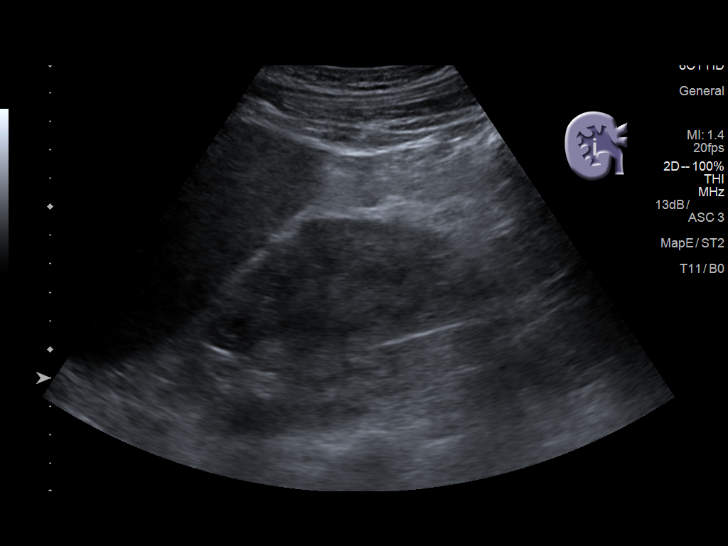
[im 103/113]
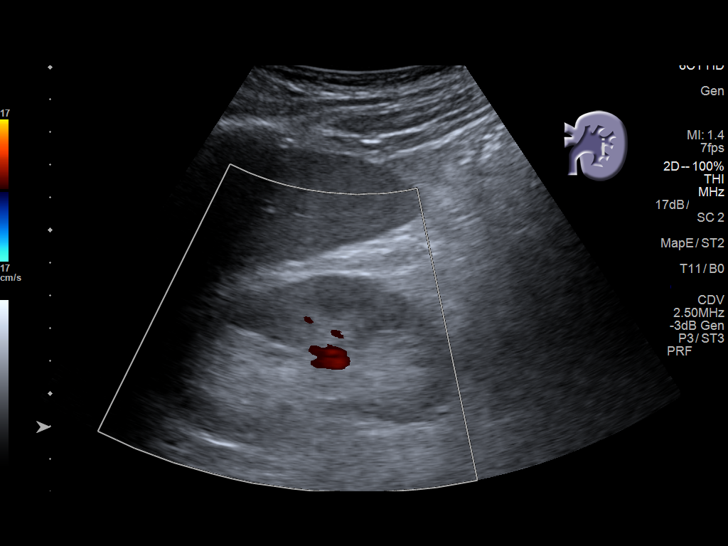
[im 113/113]
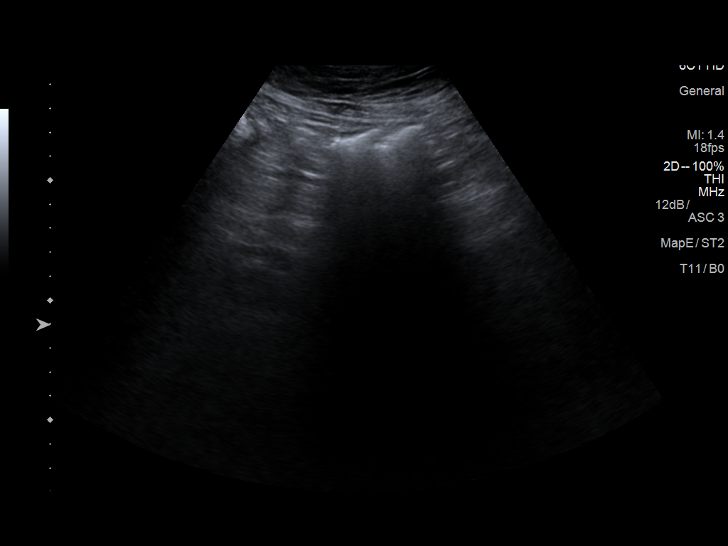

[13 of 25 positions shown; findings below may reference images not displayed]

FINDINGS: Gallbladder: Diffuse gallbladder wall thickening and edema with
gallbladder wall thickness measured 11 mm. Tiny echogenic foci are
poorly visualized but appear non mobile. These could be polyps
versus nonmobile stones. Largest measures about 4 mm in diameter.

Common bile duct: Diameter: 2.1 mm, normal

Liver: Nodular liver contour compatible with cirrhosis. No focal
liver lesions. Portal vein is patent on color Doppler imaging with
normal direction of blood flow towards the liver.

IVC: No abnormality visualized.

Pancreas: Visualized portion unremarkable.

Spleen: Size and appearance within normal limits.

Right Kidney: Length: 10.6 cm. Echogenicity within normal limits. No
mass or hydronephrosis visualized.

Left Kidney: Length: 9.5 cm. Echogenicity within normal limits. No
mass or hydronephrosis visualized.

Abdominal aorta: No aneurysm visualized.

Other findings: A small right pleural effusion is identified.
IMPRESSION: 1. Diffuse gallbladder wall thickening and edema. Nonspecific
finding. This could be inflammatory or related to cirrhosis. Tiny
nonmobile stones versus polyps measuring up to 4 mm, likely benign.
2. No bile duct dilatation.
3. Nodular liver contour consistent with cirrhosis. Normal portal
venous flow direction. Spleen is not enlarged.
4. Small right pleural effusion.
5. No significant ascites demonstrated.

## 2020-02-27 NOTE — Telephone Encounter (Signed)
Transition Care Management Follow-up Telephone Call  Date of discharge and from where: 02/24/2020, [facility]   How have you been since you were released from the hospital? He stated that he is "good."   Any questions or concerns? He said that he is interested in personal care services and will discuss with Dr Margarita Rana at his next appointment.  He explained that his roommate,Mike is a Set designer."  This CM explained that PCS through [place] are services to assist with personal care, not just housekeeping and meal preparation. If approved for PCS, he will need to choose an agency to provide the aide from a list approved by [facility].   He is not given money to pay the aide himself.  These services are provided by home aides, not nurses.   Items Reviewed:  Did the pt receive and understand the discharge instructions provided?   Yes.   Medications obtained and verified? he said that he has everything except the new medications which he said he is getting tomorrow and he  will  review them with Nira Conn, EMT who plans to see him tomorrow. He had no questions at this time ,  Any new allergies since your discharge? none reported   Do you have support at home?  has roommate  Other (ie: DME, Home Health, etc) no home health ordered.   Has community paramedic visit weekly   Has cane  Has been given a glucometer  Appointment at [facility] 03/07/2020  Functional Questionnaire: (I = Independent and D = Dependent) ADL's:independent.  [name], EMT sets up his medication box weekly  Follow up appointments reviewed:    PCP Hospital f/u appt confirmed?  Has appointment with Dr Margarita Rana  03/12/2020  [facility] f/u appt confirmed? .cardiology - 03/06/2020, GI- 03/29/2020  Are transportation arrangements needed? no his friend drives. He also can use [facility] or medicaid transportation   If their condition worsens, is the pt aware to call  their PCP or go to the ED? yes  Was the  patient provided with contact information for the PCP's office or ED?  he has the phone number for the clinic  Was the pt encouraged to call back with questions or concerns?  yes

## 2020-02-28 ENCOUNTER — Other Ambulatory Visit (HOSPITAL_COMMUNITY): Payer: Self-pay

## 2020-02-28 NOTE — Progress Notes (Signed)
Paramedicine Encounter    Patient ID: Brad [name], male    DOB: 04-Mar-1961, 59 y.o.   MRN: [id number]   Patient Care Team: Charlott Rakes, MD as PCP - General (Family Medicine) Debara Pickett Nadean Corwin, MD as PCP - Cardiology (Cardiology) Charlott Rakes, MD (Family Medicine)  Patient Active Problem List   Diagnosis Date Noted  . Chronic systolic CHF (congestive heart failure) ([place]) 10/31/2019  . Acute systolic CHF (congestive heart failure) ([place]) 08/02/2019  . Diarrhea 07/29/2019  . Gastroenteritis 07/22/2019  . Hypomagnesemia 06/19/2019  . Chest pain 06/07/2019  . Elevated troponin 05/23/2019  . Tobacco abuse 05/23/2019  . Alcohol abuse 02/21/2019  . Frequent falls 01/17/2019  . Severe mitral regurgitation   . Fall 11/01/2018  . Hyponatremia 10/31/2018  . Chronic atrial fibrillation   . Chronic hyponatremia 08/16/2018  . NSVT (nonsustained ventricular tachycardia) ([place])   . Concussion with loss of consciousness   . Scalp laceration   . Trauma   . Permanent atrial fibrillation   . Hypertensive heart disease   . Shortness of breath   . Pyogenic inflammation of bone ([place])   . Cirrhosis ([place]) 03/23/2018  . Right ankle pain 03/23/2018  . Syncope 08/07/2017  . Acute on chronic combined systolic and diastolic CHF (congestive heart failure) ([place]) 07/09/2017  . Atrial flutter ([place]) 07/09/2017  . Pre-syncope 07/08/2017  . Neuropathy 05/08/2017  . Substance induced mood disorder ([place]) 10/06/2016  . CVA (cerebral vascular accident) ([place]) 09/18/2016  . Left sided numbness   . Homelessness 08/21/2016  . S/P ORIF (open reduction internal fixation) fracture 08/01/2016  . CAD (coronary artery disease), native coronary artery 07/30/2016  . Surgery, elective   . Insomnia 07/22/2016  . NSTEMI (non-ST elevated myocardial infarction) ([place])   . Anemia 07/05/2016  . Thrombocytopenia ([place]) 07/05/2016  . Cocaine abuse ([place]) 07/02/2016  . Chest pain on breathing 07/01/2016  . Essential  hypertension 07/01/2016  . DM type 2 (diabetes mellitus, type 2) ([place]) 07/01/2016  . Hypokalemia 07/01/2016  . CKD (chronic kidney disease) stage 3, GFR 30-59 ml/min 07/01/2016  . Painful diabetic neuropathy ([place]) 07/01/2016  . Acute hyponatremia 05/27/2016  . Polysubstance abuse ([place]) 05/27/2016  . Chronic hepatitis C with cirrhosis ([place]) 05/27/2016  . Depression 04/21/2012  . GERD (gastroesophageal reflux disease) 02/16/2012  . History of drug abuse ([place])   . Heroin addiction ([place]) 01/29/2012    Current Outpatient Medications:  .  amiodarone (PACERONE) 200 MG tablet, Take 200 mg by mouth daily., Disp: , Rfl:  .  apixaban (ELIQUIS) 5 MG TABS tablet, Take 1 tablet (5 mg total) by mouth 2 (two) times daily., Disp: 60 tablet, Rfl: 6 .  atorvastatin (LIPITOR) 20 MG tablet, TAKE 1 TABLET (20 MG TOTAL) BY MOUTH DAILY AT 6 PM., Disp: 30 tablet, Rfl: 1 .  digoxin (LANOXIN) 0.125 MG tablet, Take 1 tablet (0.125 mg total) by mouth daily., Disp: 30 tablet, Rfl: 11 .  DULoxetine (CYMBALTA) 60 MG capsule, Take 1 capsule (60 mg total) by mouth daily., Disp: 30 capsule, Rfl: 6 .  gabapentin (NEURONTIN) 300 MG capsule, Take 2 capsules (600 mg total) by mouth 2 (two) times daily., Disp: 120 capsule, Rfl: 3 .  isosorbide-hydrALAZINE (BIDIL) 20-37.5 MG tablet, Take 1 tablet by mouth 3 (three) times daily., Disp: 90 tablet, Rfl: 11 .  potassium chloride SA (KLOR-CON) 20 MEQ tablet, Take 40 mEq by mouth daily., Disp: , Rfl:  .  torsemide (DEMADEX) 20 MG tablet, Take 1 tablet (20 mg total) by  mouth daily. Take 3 tabs (60mg ) every morning and 2 tabs (40mg ) every evening, Disp: 150 tablet, Rfl: 0 .  ACCU-CHEK AVIVA PLUS test strip, USE AS DIRECTED THREE TIMES DAILY (Patient not taking: Reported on 02/06/2020), Disp: 100 each, Rfl: 12 .  albuterol (VENTOLIN HFA) 108 (90 Base) MCG/ACT inhaler, Inhale 2 puffs into the lungs every 6 (six) hours as needed for shortness of breath. , Disp: , Rfl:  .  amitriptyline  (ELAVIL) 10 MG tablet, Take 20 mg by mouth at bedtime., Disp: , Rfl:  .  dicyclomine (BENTYL) 20 MG tablet, Take 1 tablet (20 mg total) by mouth 2 (two) times daily as needed for spasms. (Patient not taking: Reported on 02/28/2020), Disp: 20 tablet, Rfl: 0 .  mirtazapine (REMERON) 30 MG tablet, Take 30 mg by mouth at bedtime., Disp: , Rfl:  .  ondansetron (ZOFRAN) 4 MG tablet, Take 1 tablet (4 mg total) by mouth every 8 (eight) hours as needed. (Patient not taking: Reported on 02/28/2020), Disp: 12 tablet, Rfl: 0 .  spironolactone (ALDACTONE) 25 MG tablet, Take 1 tablet (25 mg total) by mouth daily. (Patient not taking: Reported on 02/28/2020), Disp: 30 tablet, Rfl: 11 Allergies  Allergen Reactions  . Angiotensin Receptor Blockers Anaphylaxis and Other (See Comments)    (Angioedema also with Lisinopril, therefore ARB's are contraindicated)  . Lisinopril Anaphylaxis and Other (See Comments)    Throat swells  . Pamelor [Nortriptyline Hcl] Anaphylaxis and Swelling    Throat swells     Social History   Socioeconomic History  . Marital status: Married    Spouse name: Not on file  . Number of children: 3  . Years of education: 2y college  . Highest education level: Not on file  Occupational History  . 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 Smoking status: Current Every Day Smoker    Packs/day: 0.50    Years: 28.00    Pack years: 14.00    Types: Cigarettes  . Smokeless tobacco: Never Used  Substance and Sexual Activity  . Alcohol use: Yes    Alcohol/week: 1.0 standard drinks    Types: 1 Cans of beer per week    Comment: "I drink ~ 40oz beer/day"  . Drug use: Yes    Types: IV, Cocaine, Heroin    Comment: 08/17/2018 "no heroin since 2013; I use cocaine ~ once/month, last use 03/21/2019  . Sexual activity: Not Currently  Other Topics Concern  . Not on file  Social History Narrative   ** Merged History Encounter **       Incarcerated from  2006-2010, then 10/2011-12/2011.  Has been trying to get sober (no heroin, alcohol since 10/2011).    Social Determinants of Health   Financial Resource Strain:   . Difficulty of Paying Living Expenses: Not on file  Food Insecurity:   . Worried About Charity fundraiser in the Last Year: Not on file  . Ran Out of Food in the Last Year: Not on file  Transportation Needs:   . Lack of Transportation (Medical): Not on file  . Lack of Transportation (Non-Medical): Not on file  Physical Activity:   . Days of Exercise per Week: Not on file  . Minutes of Exercise per Session: Not on file  Stress:   . Feeling of Stress : Not on file  Social Connections:   . Frequency of Communication with Friends and Family: Not on file  .  Frequency of Social Gatherings with Friends and Family: Not on file  . Attends Religious Services: Not on file  . Active Member of Clubs or Organizations: Not on file  . Attends Archivist Meetings: Not on file  . Marital Status: Not on file  Intimate Partner Violence:   . Fear of Current or Ex-Partner: Not on file  . Emotionally Abused: Not on file  . Physically Abused: Not on file  . Sexually Abused: Not on file    Physical Exam Vitals reviewed.  Constitutional:      Appearance: He is normal weight.  HENT:     Head: Normocephalic.     Nose: Nose normal.     Mouth/Throat:     Mouth: Mucous membranes are moist.  Eyes:     Pupils: Pupils are equal, round, and reactive to light.  Cardiovascular:     Rate and Rhythm: Normal rate and regular rhythm.     Pulses: Normal pulses.     Heart sounds: Normal heart sounds.  Pulmonary:     Effort: Pulmonary effort is normal.  Abdominal:     Palpations: Abdomen is soft.  Musculoskeletal:        General: Normal range of motion.     Cervical back: Normal range of motion.     Right lower leg: No edema.     Left lower leg: No edema.  Skin:    General: Skin is warm and dry.     Capillary Refill: Capillary  refill takes less than 2 seconds.  Neurological:     Mental Status: Mental status is at baseline.  Psychiatric:        Mood and Affect: Mood normal.    Arrived for home visit for K[place] who was seated on his couch alert and oriented reporting he doesn't feel as good as he normally does. Patient noted to have missed over 5 days of prescribed medications that were placed in pill box. Patient reports it's because he was in the hospital and he just forgot. I explained to patient it is very important he takes medication, hydrates with water and avoids alcohol. Patient explained he understood. Vitals were obtained noting BP to be low. Patient stated he had not eaten much today. I advised patient to eat and hydrate well. Patient denied dizziness, chest pain or shortness of breath. Patient agreed to visit in one week. Home visit complete.  Refills: Mirtazapine Amtryptilyne   Patient requesting shower chair from insurance, [name] made aware.   CBG- 129         Future Appointments  Date Time Provider [place]  03/06/2020  9:00 AM [facility] PA/NP [facility] None  03/12/2020  3:30 PM Charlott Rakes, MD [facility] None  03/29/2020  2:10 PM Mansouraty, Telford Nab., MD [facility]  04/12/2020 11:10 AM Charlott Rakes, MD [facility] None     ACTION: Home visit completed Next visit planned for one week

## 2020-03-05 ENCOUNTER — Inpatient Hospital Stay (HOSPITAL_COMMUNITY)
Admission: EM | Admit: 2020-03-05 | Discharge: 2020-03-07 | DRG: 641 | Disposition: A | Discharge status: Home / Self Care | Payer: Medicaid Other | Attending: Family Medicine | Admitting: Family Medicine

## 2020-03-05 ENCOUNTER — Emergency Department (HOSPITAL_COMMUNITY): Payer: Medicaid Other

## 2020-03-05 ENCOUNTER — Other Ambulatory Visit: Payer: Self-pay

## 2020-03-05 ENCOUNTER — Encounter (HOSPITAL_COMMUNITY): Payer: Self-pay | Admitting: Emergency Medicine

## 2020-03-05 DIAGNOSIS — Z79899 Other long term (current) drug therapy: Secondary | ICD-10-CM | POA: Diagnosis not present

## 2020-03-05 DIAGNOSIS — G629 Polyneuropathy, unspecified: Secondary | ICD-10-CM | POA: Diagnosis not present

## 2020-03-05 DIAGNOSIS — R296 Repeated falls: Secondary | ICD-10-CM | POA: Diagnosis present

## 2020-03-05 DIAGNOSIS — E1140 Type 2 diabetes mellitus with diabetic neuropathy, unspecified: Secondary | ICD-10-CM | POA: Diagnosis present

## 2020-03-05 DIAGNOSIS — I2510 Atherosclerotic heart disease of native coronary artery without angina pectoris: Secondary | ICD-10-CM | POA: Diagnosis present

## 2020-03-05 DIAGNOSIS — F331 Major depressive disorder, recurrent, moderate: Secondary | ICD-10-CM

## 2020-03-05 DIAGNOSIS — Z8673 Personal history of transient ischemic attack (TIA), and cerebral infarction without residual deficits: Secondary | ICD-10-CM | POA: Diagnosis not present

## 2020-03-05 DIAGNOSIS — D61818 Other pancytopenia: Secondary | ICD-10-CM | POA: Diagnosis present

## 2020-03-05 DIAGNOSIS — B182 Chronic viral hepatitis C: Secondary | ICD-10-CM | POA: Diagnosis present

## 2020-03-05 DIAGNOSIS — F1020 Alcohol dependence, uncomplicated: Secondary | ICD-10-CM | POA: Diagnosis present

## 2020-03-05 DIAGNOSIS — K7460 Unspecified cirrhosis of liver: Secondary | ICD-10-CM | POA: Diagnosis present

## 2020-03-05 DIAGNOSIS — E119 Type 2 diabetes mellitus without complications: Secondary | ICD-10-CM

## 2020-03-05 DIAGNOSIS — K219 Gastro-esophageal reflux disease without esophagitis: Secondary | ICD-10-CM | POA: Diagnosis present

## 2020-03-05 DIAGNOSIS — F1994 Other psychoactive substance use, unspecified with psychoactive substance-induced mood disorder: Secondary | ICD-10-CM | POA: Diagnosis present

## 2020-03-05 DIAGNOSIS — Z794 Long term (current) use of insulin: Secondary | ICD-10-CM

## 2020-03-05 DIAGNOSIS — N1831 Chronic kidney disease, stage 3a: Secondary | ICD-10-CM | POA: Diagnosis present

## 2020-03-05 DIAGNOSIS — Z8546 Personal history of malignant neoplasm of prostate: Secondary | ICD-10-CM

## 2020-03-05 DIAGNOSIS — E871 Hypo-osmolality and hyponatremia: Principal | ICD-10-CM | POA: Diagnosis present

## 2020-03-05 DIAGNOSIS — Z7901 Long term (current) use of anticoagulants: Secondary | ICD-10-CM

## 2020-03-05 DIAGNOSIS — I428 Other cardiomyopathies: Secondary | ICD-10-CM | POA: Diagnosis present

## 2020-03-05 DIAGNOSIS — I340 Nonrheumatic mitral (valve) insufficiency: Secondary | ICD-10-CM | POA: Diagnosis present

## 2020-03-05 DIAGNOSIS — F10920 Alcohol use, unspecified with intoxication, uncomplicated: Secondary | ICD-10-CM

## 2020-03-05 DIAGNOSIS — E7800 Pure hypercholesterolemia, unspecified: Secondary | ICD-10-CM | POA: Diagnosis present

## 2020-03-05 DIAGNOSIS — I130 Hypertensive heart and chronic kidney disease with heart failure and stage 1 through stage 4 chronic kidney disease, or unspecified chronic kidney disease: Secondary | ICD-10-CM | POA: Diagnosis present

## 2020-03-05 DIAGNOSIS — S0003XA Contusion of scalp, initial encounter: Secondary | ICD-10-CM

## 2020-03-05 DIAGNOSIS — K709 Alcoholic liver disease, unspecified: Secondary | ICD-10-CM

## 2020-03-05 DIAGNOSIS — N183 Chronic kidney disease, stage 3 unspecified: Secondary | ICD-10-CM | POA: Diagnosis present

## 2020-03-05 DIAGNOSIS — I5042 Chronic combined systolic (congestive) and diastolic (congestive) heart failure: Secondary | ICD-10-CM | POA: Diagnosis present

## 2020-03-05 DIAGNOSIS — F1910 Other psychoactive substance abuse, uncomplicated: Secondary | ICD-10-CM | POA: Diagnosis not present

## 2020-03-05 DIAGNOSIS — T501X5A Adverse effect of loop [high-ceiling] diuretics, initial encounter: Secondary | ICD-10-CM | POA: Diagnosis present

## 2020-03-05 DIAGNOSIS — F10129 Alcohol abuse with intoxication, unspecified: Secondary | ICD-10-CM | POA: Diagnosis not present

## 2020-03-05 DIAGNOSIS — I10 Essential (primary) hypertension: Secondary | ICD-10-CM | POA: Diagnosis present

## 2020-03-05 DIAGNOSIS — F17210 Nicotine dependence, cigarettes, uncomplicated: Secondary | ICD-10-CM | POA: Diagnosis present

## 2020-03-05 DIAGNOSIS — F329 Major depressive disorder, single episode, unspecified: Secondary | ICD-10-CM | POA: Diagnosis present

## 2020-03-05 DIAGNOSIS — I4821 Permanent atrial fibrillation: Secondary | ICD-10-CM | POA: Diagnosis present

## 2020-03-05 DIAGNOSIS — E1159 Type 2 diabetes mellitus with other circulatory complications: Secondary | ICD-10-CM

## 2020-03-05 DIAGNOSIS — I5022 Chronic systolic (congestive) heart failure: Secondary | ICD-10-CM | POA: Diagnosis not present

## 2020-03-05 DIAGNOSIS — D696 Thrombocytopenia, unspecified: Secondary | ICD-10-CM | POA: Diagnosis present

## 2020-03-05 DIAGNOSIS — F32A Depression, unspecified: Secondary | ICD-10-CM | POA: Diagnosis present

## 2020-03-05 DIAGNOSIS — E1122 Type 2 diabetes mellitus with diabetic chronic kidney disease: Secondary | ICD-10-CM | POA: Diagnosis present

## 2020-03-05 LAB — BASIC METABOLIC PANEL
Anion gap: 13 (ref 5–15)
Anion gap: 11 (ref 5–15)
Anion gap: 10 (ref 5–15)
BUN: 17 mg/dL (ref 6–20)
BUN: 17 mg/dL (ref 6–20)
BUN: 16 mg/dL (ref 6–20)
CO2: 23 mmol/L (ref 22–32)
CO2: 22 mmol/L (ref 22–32)
CO2: 23 mmol/L (ref 22–32)
Calcium: 8.6 mg/dL — ABNORMAL LOW (ref 8.9–10.3)
Calcium: 8.4 mg/dL — ABNORMAL LOW (ref 8.9–10.3)
Calcium: 8.4 mg/dL — ABNORMAL LOW (ref 8.9–10.3)
Chloride: 89 mmol/L — ABNORMAL LOW (ref 98–111)
Chloride: 90 mmol/L — ABNORMAL LOW (ref 98–111)
Chloride: 93 mmol/L — ABNORMAL LOW (ref 98–111)
Creatinine, Ser: 1.17 mg/dL (ref 0.61–1.24)
Creatinine, Ser: 1.19 mg/dL (ref 0.61–1.24)
Creatinine, Ser: 1.17 mg/dL (ref 0.61–1.24)
GFR calc Af Amer: >60 mL/min (ref >60)
GFR calc Af Amer: >60 mL/min (ref >60)
GFR calc Af Amer: >60 mL/min (ref >60)
GFR calc non Af Amer: >60 mL/min (ref >60)
GFR calc non Af Amer: >60 mL/min (ref >60)
GFR calc non Af Amer: >60 mL/min (ref >60)
Glucose, Bld: 117 mg/dL — ABNORMAL HIGH (ref 70–99)
Glucose, Bld: 119 mg/dL — ABNORMAL HIGH (ref 70–99)
Glucose, Bld: 130 mg/dL — ABNORMAL HIGH (ref 70–99)
Potassium: 4.2 mmol/L (ref 3.5–5.1)
Potassium: 4.1 mmol/L (ref 3.5–5.1)
Potassium: 3.9 mmol/L (ref 3.5–5.1)
Sodium: 125 mmol/L — ABNORMAL LOW (ref 135–145)
Sodium: 123 mmol/L — ABNORMAL LOW (ref 135–145)
Sodium: 126 mmol/L — ABNORMAL LOW (ref 135–145)

## 2020-03-05 LAB — COMPREHENSIVE METABOLIC PANEL
ALT: 21 U/L (ref 0–44)
AST: 38 U/L (ref 15–41)
Albumin: 3.8 g/dL (ref 3.5–5.0)
Alkaline Phosphatase: 73 U/L (ref 38–126)
Anion gap: 13 (ref 5–15)
BUN: 18 mg/dL (ref 6–20)
CO2: 23 mmol/L (ref 22–32)
Calcium: 8.7 mg/dL — ABNORMAL LOW (ref 8.9–10.3)
Chloride: 84 mmol/L — ABNORMAL LOW (ref 98–111)
Creatinine, Ser: 1.26 mg/dL — ABNORMAL HIGH (ref 0.61–1.24)
GFR calc Af Amer: >60 mL/min (ref >60)
GFR calc non Af Amer: >60 mL/min (ref >60)
Glucose, Bld: 117 mg/dL — ABNORMAL HIGH (ref 70–99)
Potassium: 4.2 mmol/L (ref 3.5–5.1)
Sodium: 120 mmol/L — ABNORMAL LOW (ref 135–145)
Total Bilirubin: 0.8 mg/dL (ref 0.3–1.2)
Total Protein: 8 g/dL (ref 6.5–8.1)

## 2020-03-05 LAB — PROTIME-INR
INR: 1.1 (ref 0.8–1.2)
Prothrombin Time: 14.5 seconds (ref 11.4–15.2)

## 2020-03-05 LAB — URINALYSIS, ROUTINE W REFLEX MICROSCOPIC
Bacteria, UA: NONE SEEN
Bilirubin Urine: NEGATIVE
Glucose, UA: NEGATIVE mg/dL
Ketones, ur: NEGATIVE mg/dL
Leukocytes,Ua: NEGATIVE
Nitrite: NEGATIVE
Protein, ur: 100 mg/dL — AB
Specific Gravity, Urine: 1.004 — ABNORMAL LOW (ref 1.005–1.030)
pH: 6 (ref 5.0–8.0)

## 2020-03-05 LAB — GLUCOSE, CAPILLARY: Glucose-Capillary: 135 mg/dL — ABNORMAL HIGH (ref 70–99)

## 2020-03-05 LAB — RAPID URINE DRUG SCREEN, HOSP PERFORMED
Amphetamines: NOT DETECTED
Barbiturates: NOT DETECTED
Benzodiazepines: NOT DETECTED
Cocaine: NOT DETECTED
Opiates: NOT DETECTED
Tetrahydrocannabinol: NOT DETECTED

## 2020-03-05 LAB — SAMPLE TO BLOOD BANK

## 2020-03-05 LAB — DIGOXIN LEVEL: Digoxin Level: 0.7 ng/mL — ABNORMAL LOW (ref 0.8–2.0)

## 2020-03-05 LAB — CBC
HCT: 35.6 % — ABNORMAL LOW (ref 39.0–52.0)
Hemoglobin: 12.6 g/dL — ABNORMAL LOW (ref 13.0–17.0)
MCH: 32.1 pg (ref 26.0–34.0)
MCHC: 35.4 g/dL (ref 30.0–36.0)
MCV: 90.8 fL (ref 80.0–100.0)
Platelets: 146 10*3/uL — ABNORMAL LOW (ref 150–400)
RBC: 3.92 MIL/uL — ABNORMAL LOW (ref 4.22–5.81)
RDW: 13.2 % (ref 11.5–15.5)
WBC: 2.5 10*3/uL — ABNORMAL LOW (ref 4.0–10.5)
nRBC: 0 % (ref 0.0–0.2)

## 2020-03-05 LAB — ETHANOL: Alcohol, Ethyl (B): 250 mg/dL — ABNORMAL HIGH (ref <10)

## 2020-03-05 LAB — PHOSPHORUS: Phosphorus: 3.1 mg/dL (ref 2.5–4.6)

## 2020-03-05 LAB — AMMONIA: Ammonia: 40 umol/L — ABNORMAL HIGH (ref 9–35)

## 2020-03-05 LAB — MAGNESIUM: Magnesium: 2.1 mg/dL (ref 1.7–2.4)

## 2020-03-05 MED ORDER — THIAMINE HCL 100 MG/ML IJ SOLN: 100.0000 mg | Freq: Every day | INTRAMUSCULAR | Status: DC

## 2020-03-05 MED ORDER — SENNOSIDES-DOCUSATE SODIUM 8.6-50 MG PO TABS: 1.0000 | ORAL_TABLET | Freq: Every evening | ORAL | Status: DC | PRN

## 2020-03-05 MED ORDER — FOLIC ACID 1 MG PO TABS
1.0000 mg | ORAL_TABLET | Freq: Every day | ORAL | Status: DC
  Administered 2020-03-05 – 2020-03-07 (×3): 1 mg via ORAL
  Filled 2020-03-05 (×3): qty 1

## 2020-03-05 MED ORDER — GABAPENTIN 300 MG PO CAPS
600.0000 mg | ORAL_CAPSULE | Freq: Two times a day (BID) | ORAL | Status: DC
  Administered 2020-03-05 – 2020-03-07 (×5): 600 mg via ORAL
  Filled 2020-03-05 (×6): qty 2

## 2020-03-05 MED ORDER — LORAZEPAM 2 MG/ML IJ SOLN
1.0000 mg | INTRAMUSCULAR | Status: DC | PRN
  Administered 2020-03-06 (×2): 1 mg via INTRAVENOUS
  Filled 2020-03-05 (×3): qty 1

## 2020-03-05 MED ORDER — AMITRIPTYLINE HCL 10 MG PO TABS
20.0000 mg | ORAL_TABLET | Freq: Every day | ORAL | Status: DC
  Administered 2020-03-05 – 2020-03-06 (×2): 20 mg via ORAL
  Filled 2020-03-05 (×3): qty 2

## 2020-03-05 MED ORDER — HYDRALAZINE HCL 20 MG/ML IJ SOLN
10.0000 mg | Freq: Four times a day (QID) | INTRAMUSCULAR | Status: DC | PRN
  Filled 2020-03-05: qty 1

## 2020-03-05 MED ORDER — ALBUTEROL SULFATE (2.5 MG/3ML) 0.083% IN NEBU: 3.0000 mL | INHALATION_SOLUTION | Freq: Four times a day (QID) | RESPIRATORY_TRACT | Status: DC | PRN

## 2020-03-05 MED ORDER — DULOXETINE HCL 30 MG PO CPEP
60.0000 mg | ORAL_CAPSULE | Freq: Every day | ORAL | Status: DC
  Administered 2020-03-05 – 2020-03-07 (×3): 60 mg via ORAL
  Filled 2020-03-05 (×3): qty 2

## 2020-03-05 MED ORDER — THIAMINE HCL 100 MG/ML IJ SOLN
Freq: Once | INTRAVENOUS | Status: AC
  Filled 2020-03-05: qty 1000

## 2020-03-05 MED ORDER — OXYCODONE HCL 5 MG PO TABS
5.0000 mg | ORAL_TABLET | Freq: Four times a day (QID) | ORAL | Status: DC | PRN
  Administered 2020-03-05 – 2020-03-07 (×5): 5 mg via ORAL
  Filled 2020-03-05 (×5): qty 1

## 2020-03-05 MED ORDER — ENOXAPARIN SODIUM 40 MG/0.4ML ~~LOC~~ SOLN
40.0000 mg | SUBCUTANEOUS | Status: DC
  Administered 2020-03-05: 40 mg via SUBCUTANEOUS
  Filled 2020-03-05 (×2): qty 0.4

## 2020-03-05 MED ORDER — ONDANSETRON HCL 4 MG PO TABS: 4.0000 mg | ORAL_TABLET | Freq: Three times a day (TID) | ORAL | Status: DC | PRN

## 2020-03-05 MED ORDER — INSULIN ASPART 100 UNIT/ML ~~LOC~~ SOLN
0.0000 [IU] | Freq: Three times a day (TID) | SUBCUTANEOUS | Status: DC
  Administered 2020-03-06: 2 [IU] via SUBCUTANEOUS
  Administered 2020-03-06: 3 [IU] via SUBCUTANEOUS
  Administered 2020-03-06: 1 [IU] via SUBCUTANEOUS
  Administered 2020-03-07 (×2): 2 [IU] via SUBCUTANEOUS

## 2020-03-05 MED ORDER — LORAZEPAM 1 MG PO TABS
1.0000 mg | ORAL_TABLET | ORAL | Status: DC | PRN
  Administered 2020-03-05 – 2020-03-07 (×5): 1 mg via ORAL
  Filled 2020-03-05 (×5): qty 1

## 2020-03-05 MED ORDER — MIRTAZAPINE 30 MG PO TABS
30.0000 mg | ORAL_TABLET | Freq: Every day | ORAL | Status: DC
  Administered 2020-03-05 – 2020-03-06 (×2): 30 mg via ORAL
  Filled 2020-03-05: qty 2
  Filled 2020-03-05 (×2): qty 1
  Filled 2020-03-05: qty 2
  Filled 2020-03-05: qty 1

## 2020-03-05 MED ORDER — ADULT MULTIVITAMIN W/MINERALS CH
1.0000 | ORAL_TABLET | Freq: Every day | ORAL | Status: DC
  Administered 2020-03-05 – 2020-03-07 (×3): 1 via ORAL
  Filled 2020-03-05 (×3): qty 1

## 2020-03-05 MED ORDER — ATORVASTATIN CALCIUM 20 MG PO TABS
20.0000 mg | ORAL_TABLET | Freq: Every day | ORAL | Status: DC
  Administered 2020-03-05 – 2020-03-06 (×2): 20 mg via ORAL
  Filled 2020-03-05 (×3): qty 1
  Filled 2020-03-05 (×2): qty 2

## 2020-03-05 MED ORDER — ISOSORB DINITRATE-HYDRALAZINE 20-37.5 MG PO TABS
1.0000 | ORAL_TABLET | Freq: Three times a day (TID) | ORAL | Status: DC
  Administered 2020-03-05 – 2020-03-07 (×7): 1 via ORAL
  Filled 2020-03-05 (×8): qty 1

## 2020-03-05 MED ORDER — THIAMINE HCL 100 MG PO TABS
100.0000 mg | ORAL_TABLET | Freq: Every day | ORAL | Status: DC
  Administered 2020-03-05 – 2020-03-07 (×3): 100 mg via ORAL
  Filled 2020-03-05 (×3): qty 1

## 2020-03-05 MED ORDER — SODIUM CHLORIDE 0.9 % IV SOLN: INTRAVENOUS | Status: DC

## 2020-03-05 MED ORDER — AMIODARONE HCL 200 MG PO TABS
200.0000 mg | ORAL_TABLET | Freq: Every day | ORAL | Status: DC
  Administered 2020-03-05 – 2020-03-07 (×3): 200 mg via ORAL
  Filled 2020-03-05 (×3): qty 1

## 2020-03-05 MED ORDER — DIGOXIN 125 MCG PO TABS
0.1250 mg | ORAL_TABLET | Freq: Every day | ORAL | Status: DC
  Administered 2020-03-05 – 2020-03-07 (×3): 0.125 mg via ORAL
  Filled 2020-03-05 (×3): qty 1
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
## 2020-03-05 NOTE — ED Notes (Signed)
RN and NT placed patient on bedpan due to concerns for safety with walking and bedside commode
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
## 2020-03-05 NOTE — ED Triage Notes (Signed)
Patient reports to the ED BIB EMS. Patient states he is on eliquis and has had multiple falls. Patient has a large knot on the left side of his head. Patient reports to drinking x2 24oz beers in the last 24 hours. Patient reports he has been having episodes of incontinence.

## 2020-03-06 ENCOUNTER — Encounter (HOSPITAL_COMMUNITY): Payer: Medicaid Other

## 2020-03-06 ENCOUNTER — Other Ambulatory Visit: Payer: Self-pay

## 2020-03-06 DIAGNOSIS — D61818 Other pancytopenia: Secondary | ICD-10-CM

## 2020-03-06 LAB — BASIC METABOLIC PANEL
Anion gap: 10 (ref 5–15)
Anion gap: 10 (ref 5–15)
Anion gap: 8 (ref 5–15)
BUN: 15 mg/dL (ref 6–20)
BUN: 17 mg/dL (ref 6–20)
BUN: 19 mg/dL (ref 6–20)
CO2: 24 mmol/L (ref 22–32)
CO2: 25 mmol/L (ref 22–32)
CO2: 27 mmol/L (ref 22–32)
Calcium: 8.6 mg/dL — ABNORMAL LOW (ref 8.9–10.3)
Calcium: 8.8 mg/dL — ABNORMAL LOW (ref 8.9–10.3)
Calcium: 9.1 mg/dL (ref 8.9–10.3)
Chloride: 94 mmol/L — ABNORMAL LOW (ref 98–111)
Chloride: 98 mmol/L (ref 98–111)
Chloride: 95 mmol/L — ABNORMAL LOW (ref 98–111)
Creatinine, Ser: 1.23 mg/dL (ref 0.61–1.24)
Creatinine, Ser: 1.26 mg/dL — ABNORMAL HIGH (ref 0.61–1.24)
Creatinine, Ser: 1.4 mg/dL — ABNORMAL HIGH (ref 0.61–1.24)
GFR calc Af Amer: >60 mL/min (ref >60)
GFR calc Af Amer: >60 mL/min (ref >60)
GFR calc Af Amer: >60 mL/min (ref >60)
GFR calc non Af Amer: >60 mL/min (ref >60)
GFR calc non Af Amer: >60 mL/min (ref >60)
GFR calc non Af Amer: 54 mL/min — ABNORMAL LOW (ref >60)
Glucose, Bld: 147 mg/dL — ABNORMAL HIGH (ref 70–99)
Glucose, Bld: 92 mg/dL (ref 70–99)
Glucose, Bld: 149 mg/dL — ABNORMAL HIGH (ref 70–99)
Potassium: 4.1 mmol/L (ref 3.5–5.1)
Potassium: 4.1 mmol/L (ref 3.5–5.1)
Potassium: 4.1 mmol/L (ref 3.5–5.1)
Sodium: 128 mmol/L — ABNORMAL LOW (ref 135–145)
Sodium: 133 mmol/L — ABNORMAL LOW (ref 135–145)
Sodium: 130 mmol/L — ABNORMAL LOW (ref 135–145)

## 2020-03-06 LAB — C DIFFICILE QUICK SCREEN W PCR REFLEX
C Diff antigen: NEGATIVE
C Diff interpretation: NOT DETECTED
C Diff toxin: NEGATIVE

## 2020-03-06 LAB — CBC
HCT: 32 % — ABNORMAL LOW (ref 39.0–52.0)
Hemoglobin: 10.9 g/dL — ABNORMAL LOW (ref 13.0–17.0)
MCH: 31.7 pg (ref 26.0–34.0)
MCHC: 34.1 g/dL (ref 30.0–36.0)
MCV: 93 fL (ref 80.0–100.0)
Platelets: 102 10*3/uL — ABNORMAL LOW (ref 150–400)
RBC: 3.44 MIL/uL — ABNORMAL LOW (ref 4.22–5.81)
RDW: 13.7 % (ref 11.5–15.5)
WBC: 3.6 10*3/uL — ABNORMAL LOW (ref 4.0–10.5)
nRBC: 0 % (ref 0.0–0.2)

## 2020-03-06 LAB — GLUCOSE, CAPILLARY
Glucose-Capillary: 207 mg/dL — ABNORMAL HIGH (ref 70–99)
Glucose-Capillary: 182 mg/dL — ABNORMAL HIGH (ref 70–99)
Glucose-Capillary: 139 mg/dL — ABNORMAL HIGH (ref 70–99)
Glucose-Capillary: 175 mg/dL — ABNORMAL HIGH (ref 70–99)

## 2020-03-06 NOTE — Progress Notes (Signed)
PROGRESS NOTE  Brad [name] CXF:[id number] DOB: 19-Oct-1961 DOA: 03/05/2020 PCP: Charlott Rakes, MD    Brief summary:  h/o hypertension, A. fib/flutter on chronic anticoagulation, prostate cancer, chronic combined systolic/diastolic CHF with low EF, GERD, hep C, neuropathy, polysubstance abuse, severe alcohol dependence who has had multiple hospitalizations for acute on chronic hyponatremia and recurrent falls presents with similar complaints.  He was recently  hospitalized from March 3 to March 5 for the same.   HPI/Recap of past 24 hours:  Watery stool x3 documented Sodium 133  Assessment/Plan: Principal Problem:   Hyponatremia Active Problems:   GERD (gastroesophageal reflux disease)   Depression   Polysubstance abuse (HCC)   Chronic hepatitis C with cirrhosis (HCC)   Essential hypertension   DM type 2 (diabetes mellitus, type 2) (HCC)   CKD (chronic kidney disease) stage 3, GFR 30-59 ml/min   Thrombocytopenia (HCC)   Substance induced mood disorder (HCC)   Neuropathy   Permanent atrial fibrillation   Severe mitral regurgitation   Frequent falls   Chronic systolic CHF (congestive heart failure) (HCC)   Alcohol abuse with intoxication ([place])  Acute on chronic Hyponatremia:  -Recurrent due to chronic diuretic use and beer potomania. Hold diuretics, IV hydration. Slow correction  Chronic A. Fib On amiodarone and digoxin at home Digoxin level less than 0.7 Anticoagulation discontinued given high risk of fatal bleed from recurrent falls  Chronic combined systolic/diastolic CHF:  Overall compensated. Resume home meds except diuretics. Diuretics dose lowered in last admission --may need further titration down  Hypertension; continue BiDil, home meds diuretic on hold  CKD 2 -Creatinine fluctuating -Repeat BMP in the morning, renal dosing meds  Pancytopenia Appear chronic, related to alcohol use and hepatitis C No evidence of splenomegaly on  CT Monitor  Polysubstance abuse/alcohol dependence:  Has h/o cocaine/heroin addiction. Reports drinking 2-4 cans beer /day. BAC level at 250 on arrival. Watch for withdrawals on CIWA  Depression, denies suicidal ideation or homicidal ideation, calm and cooperative  Frequent falls Fall precaution PT eval  DVT Prophylaxis: Lovenox  Code Status: Full  Family Communication: patient   Disposition Plan:    Patient came from:          Home                                                                                                 Anticipated d/c place:  TBD  Barriers to d/c OR conditions which need to be met to effect a safe d/c:  Not medically ready for discharge, awaiting sodium improvement, need physical therapy evaluation   Consultants:  PT  Procedures:  none  Antibiotics:  none   Objective: BP (!) 145/93 (BP Location: Right Arm)   Pulse 75   Temp 98.7 F (37.1 C)   Resp 18   SpO2 93%   Intake/Output Summary (Last 24 hours) at 03/06/2020 1449 Last data filed at 03/05/2020 1800 Gross per 24 hour  Intake 918.51 ml  Output 475 ml  Net 443.51 ml   There were no vitals filed for this visit.  Exam: Patient is examined daily  including today on 03/06/2020, exams remain the same as of yesterday except that has changed    General:  NAD  Cardiovascular: RRR  Respiratory: CTABL  Abdomen: Soft/ND/NT, positive BS  Musculoskeletal: No Edema  Neuro: alert, oriented   Data Reviewed: Basic Metabolic Panel: Recent Labs  Lab 03/05/20 0901 03/05/20 0901 03/05/20 1441 03/05/20 1644 03/05/20 2244 03/06/20 0713 03/06/20 1349  NA 120*   < > 125* 123* 126* 128* 133*  K 4.2   < > 4.2 4.1 3.9 4.1 4.1  CL 84*   < > 89* 90* 93* 94* 98  CO2 23   < > _0 GLUCOSE 117*   < > 117* 119* 130* 147* 92  BUN 18   < > _1 CREATININE 1.26*   < > 1.17 1.19 1.17 1.23 1.26*  CALCIUM 8.7*   < > 8.6* 8.4* 8.4* 8.6* 8.8*  MG 2.1  --   --   --   --    --   --   PHOS 3.1  --   --   --   --   --   --    < > = values in this interval not displayed.   Liver Function Tests: Recent Labs  Lab 03/05/20 0901  AST 38  ALT 21  ALKPHOS 73  BILITOT 0.8  PROT 8.0  ALBUMIN 3.8   No results for input(s): LIPASE, AMYLASE in the last 168 hours. Recent Labs  Lab 03/05/20 0902  AMMONIA 40*   CBC: Recent Labs  Lab 03/05/20 0901 03/06/20 0713  WBC 2.5* 3.6*  HGB 12.6* 10.9*  HCT 35.6* 32.0*  MCV 90.8 93.0  PLT 146* 102*   Cardiac Enzymes:   No results for input(s): CKTOTAL, CKMB, CKMBINDEX, TROPONINI in the last 168 hours. BNP (last 3 results) Recent Labs    01/01/20 0027 01/10/20 1622 01/16/20 1210  BNP 1,985.9* 1,118.5* 476.0*    ProBNP (last 3 results) No results for input(s): PROBNP in the last 8760 hours.  CBG: Recent Labs  Lab 03/05/20 2109 03/06/20 0730 03/06/20 1128  GLUCAP 135* 207* 182*    No results found for this or any previous visit (from the past 240 hour(s)).   Studies: No results found.  Scheduled Meds: . amiodarone  200 mg Oral Daily  . amitriptyline  20 mg Oral QHS  . atorvastatin  20 mg Oral q1800  . digoxin  0.125 mg Oral Daily  . DULoxetine  60 mg Oral Daily  . enoxaparin (LOVENOX) injection  40 mg Subcutaneous Q24H  . folic acid  1 mg Oral Daily  . gabapentin  600 mg Oral BID  . insulin aspart  0-9 Units Subcutaneous TID WC  . isosorbide-hydrALAZINE  1 tablet Oral TID  . mirtazapine  30 mg Oral QHS  . multivitamin with minerals  1 tablet Oral Daily  . thiamine  100 mg Oral Daily   Or  . thiamine  100 mg Intravenous Daily    Continuous Infusions: . sodium chloride 75 mL/hr at 03/06/20 1433     Time spent: 48mns I have personally reviewed and interpreted on  03/06/2020 daily labs, tele strips, imagings as discussed above under date review session and assessment and plans.  I reviewed all nursing notes, pharmacy notes, consultant notes,  vitals, pertinent old records  I have  discussed plan of care as described above with RN , patient on 03/06/2020   FFlorencia ReasonsMD, PhD,  FACP  [facility]  Available via Epic secure chat 7am-7pm for nonurgent issues Please page for urgent issues, pager number available through [email or web address] .   03/06/2020, 2:49 PM  LOS: 1 day

## 2020-03-07 ENCOUNTER — Telehealth: Payer: Self-pay | Admitting: Hematology

## 2020-03-07 ENCOUNTER — Encounter: Payer: Self-pay | Admitting: Hematology

## 2020-03-07 LAB — GLUCOSE, CAPILLARY
Glucose-Capillary: 197 mg/dL — ABNORMAL HIGH (ref 70–99)
Glucose-Capillary: 156 mg/dL — ABNORMAL HIGH (ref 70–99)

## 2020-03-07 LAB — BASIC METABOLIC PANEL
Anion gap: 11 (ref 5–15)
BUN: 19 mg/dL (ref 6–20)
CO2: 23 mmol/L (ref 22–32)
Calcium: 8.9 mg/dL (ref 8.9–10.3)
Chloride: 97 mmol/L — ABNORMAL LOW (ref 98–111)
Creatinine, Ser: 1.53 mg/dL — ABNORMAL HIGH (ref 0.61–1.24)
GFR calc Af Amer: 56 mL/min — ABNORMAL LOW (ref >60)
GFR calc non Af Amer: 49 mL/min — ABNORMAL LOW (ref >60)
Glucose, Bld: 148 mg/dL — ABNORMAL HIGH (ref 70–99)
Potassium: 4.1 mmol/L (ref 3.5–5.1)
Sodium: 131 mmol/L — ABNORMAL LOW (ref 135–145)

## 2020-03-07 LAB — CBC WITH DIFFERENTIAL/PLATELET
Abs Immature Granulocytes: 0.02 10*3/uL (ref 0.00–0.07)
Basophils Absolute: 0 10*3/uL (ref 0.0–0.1)
Basophils Relative: 1 %
Eosinophils Absolute: 0.1 10*3/uL (ref 0.0–0.5)
Eosinophils Relative: 3 %
HCT: 33.3 % — ABNORMAL LOW (ref 39.0–52.0)
Hemoglobin: 10.9 g/dL — ABNORMAL LOW (ref 13.0–17.0)
Immature Granulocytes: 1 %
Lymphocytes Relative: 14 %
Lymphs Abs: 0.4 10*3/uL — ABNORMAL LOW (ref 0.7–4.0)
MCH: 31.7 pg (ref 26.0–34.0)
MCHC: 32.7 g/dL (ref 30.0–36.0)
MCV: 96.8 fL (ref 80.0–100.0)
Monocytes Absolute: 0.6 10*3/uL (ref 0.1–1.0)
Monocytes Relative: 18 %
Neutro Abs: 2 10*3/uL (ref 1.7–7.7)
Neutrophils Relative %: 63 %
Platelets: 106 10*3/uL — ABNORMAL LOW (ref 150–400)
RBC: 3.44 MIL/uL — ABNORMAL LOW (ref 4.22–5.81)
RDW: 14 % (ref 11.5–15.5)
WBC: 3.1 10*3/uL — ABNORMAL LOW (ref 4.0–10.5)
nRBC: 0 % (ref 0.0–0.2)

## 2020-03-07 MED ORDER — POTASSIUM CHLORIDE CRYS ER 20 MEQ PO TBCR: 20.0000 meq | EXTENDED_RELEASE_TABLET | Freq: Every day | ORAL | Status: DC

## 2020-03-07 MED ORDER — TORSEMIDE 20 MG PO TABS: ORAL_TABLET | ORAL | 0 refills | Status: DC

## 2020-03-07 MED ORDER — TORSEMIDE 20 MG PO TABS
20.0000 mg | ORAL_TABLET | Freq: Every evening | ORAL | Status: DC
  Filled 2020-03-07: qty 1

## 2020-03-07 MED ORDER — TORSEMIDE 20 MG PO TABS
40.0000 mg | ORAL_TABLET | Freq: Every morning | ORAL | Status: DC
  Administered 2020-03-07: 40 mg via ORAL
  Filled 2020-03-07: qty 2
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
## 2020-03-07 NOTE — Discharge Instructions (Signed)
Brad [name]  You were in the hospital because of low sodium. This is likely related to your torsemide. Your dose has been adjusted. You were also on Eliquis which has been discontinued because of your falling risk. Please discuss this with your heart doctor on follow-up   Please contact these agencies to check your housing applications:  [facility] @  [phone]  [facility] @  [phone]

## 2020-03-07 NOTE — Telephone Encounter (Signed)
Received a msg to schedule a hematology appt for Brad [name] for leukopenia. Pt has been scheduled to see Dr. Irene Limbo on 4/12 at 1pm. He's currently in the hospital. Letter mailed.
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
## 2020-03-07 NOTE — Progress Notes (Signed)
Went over discharge instructions w/ pt he verbalized understanding.

## 2020-03-07 NOTE — TOC Initial Note (Signed)
Transition of Care [facility]) - Initial/Assessment Note    Patient Details  Name: Brad [name] MRN: [id number] Date of Birth: 02-21-1961  [facility]) CM/SW Contact:    Lennart Pall, LCSW Phone Number: 03/07/2020, 2:53 PM  Clinical Narrative:     Pt with return to hospital after a fall at home and hyponatremia. Reviewed his current social issues/ concerns and discussed concern of continued ETOH use.  Pt, again, declines any need for ETOH tx resources and states his "only problem" is his housing situation.  He asks if I can assist him with getting better housing.  Reviewed/ confirmed with him that he completed a Administrator, sports in Jan [id number]. He has, also, completed application for [employer].  He is working with Eden Lathe with [employer] who is assisting him with additional resource referrals and is very familiar with his situation. In addition to these resources, he receives Para-medicine services and is followed at [employer].  Have discussed the importance of following the recommendations of MD and [employer] and Wellness case Freight forwarder.  Pt confirms plan to return to apt with roommate when discharged.  Will continue to monitor for any additional needs.     Expected Discharge Plan: Home/Self Care Barriers to Discharge: Continued Medical Work up   Patient Goals and CMS Choice Patient states their goals for this hospitalization and ongoing recovery are:: Pt (still) has primary goal of securing better housing. CMS Medicare.gov Compare Post Acute Care list provided to:: Patient Choice offered to / list presented to : Patient  Expected Discharge Plan and Services Expected Discharge Plan: Home/Self Care In-house Referral: Clinical Social Work   Post Acute Care Choice: NA Living arrangements for the past 2 months: Apartment                 DME Arranged: N/A                    Prior Living Arrangements/Services Living arrangements for the past 2  months: Apartment Lives with:: Roommate Patient language and need for interpreter reviewed:: Yes Do you feel safe going back to the place where you live?: Yes      Need for Family Participation in Patient Care: No (Comment) Care giver support system in place?: Yes (comment) Current home services: Other (comment)(followed by para-medicine in community) Criminal Activity/Legal Involvement Pertinent to Current Situation/Hospitalization: No - Comment as needed  Activities of Daily Living Home Assistive Devices/Equipment: Cane (specify quad or straight) ADL Screening (condition at time of admission) Patient's cognitive ability adequate to safely complete daily activities?: Yes Is the patient deaf or have difficulty hearing?: No Does the patient have difficulty seeing, even when wearing glasses/contacts?: No Does the patient have difficulty concentrating, remembering, or making decisions?: No Patient able to express need for assistance with ADLs?: Yes Does the patient have difficulty dressing or bathing?: Yes Independently performs ADLs?: No Communication: Independent Dressing (OT): Dependent Is this a change from baseline?: Pre-admission baseline Grooming: Dependent Is this a change from baseline?: Pre-admission baseline Feeding: Independent Bathing: Dependent Is this a change from baseline?: Pre-admission baseline Toileting: Dependent Is this a change from baseline?: Pre-admission baseline In/Out Bed: Independent Walks in Home: Dependent Is this a change from baseline?: Pre-admission baseline Does the patient have difficulty walking or climbing stairs?: No Weakness of Legs: Both Weakness of Arms/Hands: None  Permission Sought/Granted Permission sought to share information with : Other (comment)(roomate)    Share Information with NAME: Gustavo Lah  Emotional Assessment Appearance:: Appears older than stated age Attitude/Demeanor/Rapport: Engaged Affect (typically  observed): Appropriate Orientation: : Oriented to Self, Oriented to Place, Oriented to  Time, Oriented to Situation Alcohol / Substance Use: Alcohol Use Psych Involvement: No (comment)  Admission diagnosis:  Chronic alcoholism (HCC) [F10.20] Hyponatremia [E87.1] Thrombocytopenia (HCC) [D69.6] Frequent falls [R29.6] Contusion of scalp, initial encounter [id number] Acute alcoholic intoxication without complication (HCC) [id number] Chronic alcoholic liver disease ([place]) [K70.9] Patient Active Problem List   Diagnosis Date Noted  . Alcohol abuse with intoxication ([place]) 03/05/2020  . Chronic systolic CHF (congestive heart failure) ([place]) 10/31/2019  . Acute systolic CHF (congestive heart failure) ([place]) 08/02/2019  . Diarrhea 07/29/2019  . Gastroenteritis 07/22/2019  . Hypomagnesemia 06/19/2019  . Chest pain 06/07/2019  . Elevated troponin 05/23/2019  . Tobacco abuse 05/23/2019  . Alcohol abuse 02/21/2019  . Frequent falls 01/17/2019  . Severe mitral regurgitation   . Fall 11/01/2018  . Hyponatremia 10/31/2018  . Chronic atrial fibrillation   . Chronic hyponatremia 08/16/2018  . NSVT (nonsustained ventricular tachycardia) ([place])   . Concussion with loss of consciousness   . Scalp laceration   . Trauma   . Permanent atrial fibrillation   . Hypertensive heart disease   . Shortness of breath   . Pyogenic inflammation of bone ([place])   . Cirrhosis ([place]) 03/23/2018  . Right ankle pain 03/23/2018  . Syncope 08/07/2017  . Acute on chronic combined systolic and diastolic CHF (congestive heart failure) ([place]) 07/09/2017  . Atrial flutter ([place]) 07/09/2017  . Pre-syncope 07/08/2017  . Neuropathy 05/08/2017  . Substance induced mood disorder ([employer]) 10/06/2016  . CVA (cerebral vascular accident) ([place]) 09/18/2016  . Left sided numbness   . Homelessness 08/21/2016  . S/P ORIF (open reduction internal fixation) fracture 08/01/2016  . CAD (coronary artery disease), native coronary artery  07/30/2016  . Surgery, elective   . Insomnia 07/22/2016  . NSTEMI (non-ST elevated myocardial infarction) ([place])   . Anemia 07/05/2016  . Thrombocytopenia ([place]) 07/05/2016  . Cocaine abuse ([place]) 07/02/2016  . Chest pain on breathing 07/01/2016  . Essential hypertension 07/01/2016  . DM type 2 (diabetes mellitus, type 2) ([place]) 07/01/2016  . Hypokalemia 07/01/2016  . CKD (chronic kidney disease) stage 3, GFR 30-59 ml/min 07/01/2016  . Painful diabetic neuropathy ([place]) 07/01/2016  . Acute hyponatremia 05/27/2016  . Polysubstance abuse ([place]) 05/27/2016  . Chronic hepatitis C with cirrhosis ([place]) 05/27/2016  . Depression 04/21/2012  . GERD (gastroesophageal reflux disease) 02/16/2012  . History of drug abuse ([place])   . Heroin addiction ([place]) 01/29/2012   PCP:  Charlott Rakes, MD Pharmacy:   [place], [place] - [address] [place] [address] Phone: [phone] Fax: [phone]     Social Determinants of Health (SDOH) Interventions    Readmission Risk Interventions Readmission Risk Prevention Plan 03/07/2020 02/24/2020 11/03/2019  Transportation Screening Complete Complete Complete  Medication Review (RN Care Manager) Complete Complete Complete  PCP or Specialist appointment within 3-5 days of discharge Complete Not Complete Complete  PCP/Specialist Appt Not Complete comments - Earliest appointment available is 10 business days -  [place] or [place] Complete Complete Complete  HRI or Home Care Consult Pt Refusal Comments - - -  SW Recovery Care/Counseling Consult Complete Complete Complete  Palliative Care Screening Not Applicable Not Applicable Not [place] Not Applicable Not Applicable Not Applicable  Some recent data might be hidden

## 2020-03-08 ENCOUNTER — Telehealth: Payer: Self-pay

## 2020-03-08 NOTE — Telephone Encounter (Signed)
Transition Care Management Follow-up Telephone Call  Date of discharge and from where: 03/07/2020, [facility]   How have you been since you were released from the hospital?he said he is " making it" and explained to this CM that he had not been eating and had been drinking too much beer and ended up falling in the bathroom. He explained how he has a lot going on and has been thinking about the deaths in his family - mother, father, son.  He then explained how he has been speaking to his step mother and step sister who live in [place] on a regular basis for the past month and noted that speaking to them has been a " blessing."   Any questions or concerns?  questions regarding setting up medications noted below.   Items Reviewed:  Did the pt receive and understand the discharge instructions provided?  Yes he had the instructions but needs assistance with medications.   Medications obtained and verified?  he said that he has all of his medications. No questions at this time. He said that he needs help setting up the medications and needs [name], EMT to come to assist him.  Message sent to [facility] noting his need for assistance and requesting that she call him.   New allergies?  none reported   Do you have support at home? lives with a roommate  Other (ie: DME, [place], etc) no home health ordered.  Has community paramedic visit weekly to assist with medication management and overseeing medical needs.   Has a glucometer  Had been scheduled to follow up with [facility] yesterday but was hospitalized.  He said that he has re-scheduled the appointment for sometime in April.  he was not sure of the date..   Is interested in [place] program through [place] but needs mental health evaluation to determine if he is eligible.   This CM has discussed with him the need for payee to assist with managing his finances if he is expecting to someday live independently.    Functional  Questionnaire: (I = Independent and D = Dependent) ADL's: independent.  He said that he has been using a walker with ambulation. Roommate provides assistance if needed.     Follow up appointments reviewed:    PCP Hospital f/u appt confirmed? Appointment with Dr Margarita Rana 03/12/2020 @ 1530, [facility] f/u appt confirmed? [facility]; GI-03/29/2020; oncology - 04/02/2020  Are transportation arrangements needed?  his roommate drive or he can take medicaid transportation or [place]  If their condition worsens, is the pt aware to call  their PCP or go to the ED?yes  Was the patient provided with contact information for the PCP's office or ED? He has the contact number for this clinic  Was the pt encouraged to call back with questions or concerns?   yes

## 2020-03-12 ENCOUNTER — Other Ambulatory Visit: Payer: Self-pay

## 2020-03-12 ENCOUNTER — Other Ambulatory Visit (HOSPITAL_COMMUNITY): Payer: Self-pay | Admitting: *Deleted

## 2020-03-12 ENCOUNTER — Ambulatory Visit: Payer: Medicaid Other | Attending: Family Medicine | Admitting: Family Medicine

## 2020-03-12 ENCOUNTER — Encounter: Payer: Self-pay | Admitting: Family Medicine

## 2020-03-12 ENCOUNTER — Other Ambulatory Visit (HOSPITAL_COMMUNITY): Payer: Self-pay

## 2020-03-12 VITALS — BP 140/68 | HR 62 | Resp 16 | Wt 173.0 lb

## 2020-03-12 DIAGNOSIS — E871 Hypo-osmolality and hyponatremia: Secondary | ICD-10-CM

## 2020-03-12 DIAGNOSIS — I110 Hypertensive heart disease with heart failure: Secondary | ICD-10-CM

## 2020-03-12 DIAGNOSIS — I482 Chronic atrial fibrillation, unspecified: Secondary | ICD-10-CM | POA: Diagnosis not present

## 2020-03-12 DIAGNOSIS — E1159 Type 2 diabetes mellitus with other circulatory complications: Secondary | ICD-10-CM | POA: Diagnosis not present

## 2020-03-12 DIAGNOSIS — T148XXA Other injury of unspecified body region, initial encounter: Secondary | ICD-10-CM

## 2020-03-12 DIAGNOSIS — R296 Repeated falls: Secondary | ICD-10-CM

## 2020-03-12 DIAGNOSIS — F1010 Alcohol abuse, uncomplicated: Secondary | ICD-10-CM

## 2020-03-12 DIAGNOSIS — Z794 Long term (current) use of insulin: Secondary | ICD-10-CM

## 2020-03-12 DIAGNOSIS — I5042 Chronic combined systolic (congestive) and diastolic (congestive) heart failure: Secondary | ICD-10-CM

## 2020-03-12 MED ORDER — SPIRONOLACTONE 25 MG PO TABS: 25.0000 mg | ORAL_TABLET | Freq: Every day | ORAL | 11 refills | Status: DC

## 2020-03-12 MED ORDER — POTASSIUM CHLORIDE CRYS ER 20 MEQ PO TBCR: 20.0000 meq | EXTENDED_RELEASE_TABLET | Freq: Every day | ORAL | Status: DC

## 2020-03-12 NOTE — Progress Notes (Signed)
Paramedicine Encounter    Patient ID: Brad [name], male    DOB: 1961/11/14, 59 y.o.   MRN: [id number]   Patient Care Team: Charlott Rakes, MD as PCP - General (Family Medicine) Debara Pickett Nadean Corwin, MD as PCP - Cardiology (Cardiology) Charlott Rakes, MD (Family Medicine)  Patient Active Problem List   Diagnosis Date Noted  . Alcohol abuse with intoxication ([place]) 03/05/2020  . Chronic systolic CHF (congestive heart failure) ([place]) 10/31/2019  . Acute systolic CHF (congestive heart failure) ([place]) 08/02/2019  . Diarrhea 07/29/2019  . Gastroenteritis 07/22/2019  . Hypomagnesemia 06/19/2019  . Chest pain 06/07/2019  . Elevated troponin 05/23/2019  . Tobacco abuse 05/23/2019  . Alcohol abuse 02/21/2019  . Frequent falls 01/17/2019  . Severe mitral regurgitation   . Fall 11/01/2018  . Hyponatremia 10/31/2018  . Chronic atrial fibrillation   . Chronic hyponatremia 08/16/2018  . NSVT (nonsustained ventricular tachycardia) ([place])   . Concussion with loss of consciousness   . Scalp laceration   . Trauma   . Permanent atrial fibrillation   . Hypertensive heart disease   . Shortness of breath   . Pyogenic inflammation of bone ([place])   . Cirrhosis ([place]) 03/23/2018  . Right ankle pain 03/23/2018  . Syncope 08/07/2017  . Acute on chronic combined systolic and diastolic CHF (congestive heart failure) ([place]) 07/09/2017  . Atrial flutter ([place]) 07/09/2017  . Pre-syncope 07/08/2017  . Neuropathy 05/08/2017  . Substance induced mood disorder ([place]) 10/06/2016  . CVA (cerebral vascular accident) ([place]) 09/18/2016  . Left sided numbness   . Homelessness 08/21/2016  . S/P ORIF (open reduction internal fixation) fracture 08/01/2016  . CAD (coronary artery disease), native coronary artery 07/30/2016  . Surgery, elective   . Insomnia 07/22/2016  . NSTEMI (non-ST elevated myocardial infarction) ([place])   . Anemia 07/05/2016  . Thrombocytopenia ([place]) 07/05/2016  . Cocaine abuse ([place]) 07/02/2016  .  Chest pain on breathing 07/01/2016  . Essential hypertension 07/01/2016  . DM type 2 (diabetes mellitus, type 2) ([place]) 07/01/2016  . Hypokalemia 07/01/2016  . CKD (chronic kidney disease) stage 3, GFR 30-59 ml/min 07/01/2016  . Painful diabetic neuropathy ([place]) 07/01/2016  . Acute hyponatremia 05/27/2016  . Polysubstance abuse ([place]) 05/27/2016  . Chronic hepatitis C with cirrhosis ([place]) 05/27/2016  . Depression 04/21/2012  . GERD (gastroesophageal reflux disease) 02/16/2012  . History of drug abuse ([place])   . Heroin addiction ([place]) 01/29/2012    Current Outpatient Medications:  .  albuterol (VENTOLIN HFA) 108 (90 Base) MCG/ACT inhaler, Inhale 2 puffs into the lungs every 6 (six) hours as needed for shortness of breath. , Disp: , Rfl:  .  amiodarone (PACERONE) 200 MG tablet, Take 200 mg by mouth daily., Disp: , Rfl:  .  amitriptyline (ELAVIL) 10 MG tablet, Take 20 mg by mouth at bedtime., Disp: , Rfl:  .  atorvastatin (LIPITOR) 20 MG tablet, TAKE 1 TABLET (20 MG TOTAL) BY MOUTH DAILY AT 6 PM., Disp: 30 tablet, Rfl: 1 .  digoxin (LANOXIN) 0.125 MG tablet, Take 1 tablet (0.125 mg total) by mouth daily., Disp: 30 tablet, Rfl: 11 .  DULoxetine (CYMBALTA) 60 MG capsule, Take 1 capsule (60 mg total) by mouth daily., Disp: 30 capsule, Rfl: 6 .  gabapentin (NEURONTIN) 300 MG capsule, Take 2 capsules (600 mg total) by mouth 2 (two) times daily., Disp: 120 capsule, Rfl: 3 .  isosorbide-hydrALAZINE (BIDIL) 20-37.5 MG tablet, Take 1 tablet by mouth 3 (three) times daily., Disp: 90 tablet, Rfl: 11 .  mirtazapine (REMERON) 30 MG tablet, Take 30 mg by mouth at bedtime., Disp: , Rfl:  .  torsemide (DEMADEX) 20 MG tablet, Take 2 tablets (40 mg total) by mouth in the morning AND 1 tablet (20 mg total) every evening., Disp: 90 tablet, Rfl: 0 .  ACCU-CHEK AVIVA PLUS test strip, USE AS DIRECTED THREE TIMES DAILY (Patient not taking: Reported on 03/12/2020), Disp: 100 each, Rfl: 12 .  potassium chloride SA  (KLOR-CON) 20 MEQ tablet, Take 1 tablet (20 mEq total) by mouth daily., Disp:  , Rfl:  .  spironolactone (ALDACTONE) 25 MG tablet, Take 1 tablet (25 mg total) by mouth daily., Disp: 30 tablet, Rfl: 11 Allergies  Allergen Reactions  . Angiotensin Receptor Blockers Anaphylaxis and Other (See Comments)    (Angioedema also with Lisinopril, therefore ARB's are contraindicated)  . Lisinopril Anaphylaxis and Other (See Comments)    Throat swells  . Pamelor [Nortriptyline Hcl] Anaphylaxis and Swelling    Throat swells     Social History   Socioeconomic History  . Marital status: Married    Spouse name: Not on file  . Number of children: 3  . Years of education: 2y college  . Highest education level: Not on file  Occupational History  . 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 Smoking status: Current Every Day Smoker    Packs/day: 0.50    Years: 28.00    Pack years: 14.00    Types: Cigarettes  . Smokeless tobacco: Never Used  Substance and Sexual Activity  . Alcohol use: Yes    Alcohol/week: 1.0 standard drinks    Types: 1 Cans of beer per week    Comment: "I drink ~ 40oz beer/day"  . Drug use: Yes    Types: IV, Cocaine, Heroin    Comment: 08/17/2018 "no heroin since 2013; I use cocaine ~ once/month, last use 03/21/2019  . Sexual activity: Not Currently  Other Topics Concern  . Not on file  Social History Narrative   ** Merged History Encounter **       Incarcerated from 2006-2010, then 10/2011-12/2011.  Has been trying to get sober (no heroin, alcohol since 10/2011).    Social Determinants of Health   Financial Resource Strain:   . Difficulty of Paying Living Expenses:   Food Insecurity:   . Worried About Charity fundraiser in the Last Year:   . Arboriculturist in the Last Year:   Transportation Needs:   . Film/video editor (Medical):   Marland Kitchen Lack of Transportation (Non-Medical):   Physical Activity:   . Days of Exercise per  Week:   . Minutes of Exercise per Session:   Stress:   . Feeling of Stress :   Social Connections:   . Frequency of Communication with Friends and Family:   . Frequency of Social Gatherings with Friends and Family:   . Attends Religious Services:   . Active Member of Clubs or Organizations:   . Attends Archivist Meetings:   Marland Kitchen Marital Status:   Intimate Partner Violence:   . Fear of Current or Ex-Partner:   . Emotionally Abused:   Marland Kitchen Physically Abused:   . Sexually Abused:     Physical Exam Vitals reviewed.  Constitutional:      Appearance: He is normal weight.  HENT:     Head: Normocephalic.     Nose: Nose normal.     Mouth/Throat:  Mouth: Mucous membranes are moist.  Eyes:     Pupils: Pupils are equal, round, and reactive to light.  Cardiovascular:     Rate and Rhythm: Normal rate and regular rhythm.     Pulses: Normal pulses.     Heart sounds: Normal heart sounds.  Pulmonary:     Effort: Pulmonary effort is normal.     Breath sounds: Normal breath sounds.  Abdominal:     General: There is distension.  Musculoskeletal:        General: Normal range of motion.     Cervical back: Normal range of motion.     Right lower leg: Edema present.     Left lower leg: Edema present.  Skin:    General: Skin is warm and dry.     Capillary Refill: Capillary refill takes less than 2 seconds.  Neurological:     Mental Status: He is alert. Mental status is at baseline.  Psychiatric:        Mood and Affect: Mood normal.    Arrived for [facility] home visit where he was seated on a couch with multiple 40oz beers surrounding him with Gwyndolyn Saxon admitted to drinking a "few". Vitals obtained. Virtual visit completed with Dr. Margarita Rana. Dr. Margarita Rana advised patient would be appropriate for grief counseling and A/A meetings and he agreed with same. Patient also explained he cut his toe Saturday and now has an open wound. He is being referred to Podiatry. Dr. Margarita Rana moved Torsemide to  40mg  BID and hold Eliquis due to increased alcohol use and falls.  HF clinic made aware of same and Amy PA agreed with same until patient is seen in clinic on 4/5. Pill box filled appropriately. Home visit and virtual visit complete for Luci Bank verbalized understanding of importance of taking meds and decreasing his alcohol intake. Visit in one week.   Refills: Spironalactone  Potassium   CBG- 119    Future Appointments  Date Time Provider [place]  03/26/2020  9:30 AM [facility] PA/NP [facility] None  03/29/2020  2:10 PM Mansouraty, Telford Nab., MD [facility]  04/02/2020  1:00 PM Brunetta Genera, MD [facility] None  04/12/2020 11:10 AM Charlott Rakes, MD [facility] None     ACTION: Home visit completed Next visit planned for one week

## 2020-03-12 NOTE — Progress Notes (Signed)
Pt states he cut his foot yesterday. Pt states he was trying to cut his toenails  Pt states he can't feel his feet because of neuropathy   Pt blood sugar is 119   Per [name] pt has a 40 sitting in front of him

## 2020-03-13 ENCOUNTER — Encounter: Payer: Self-pay | Admitting: Family Medicine

## 2020-03-13 ENCOUNTER — Telehealth: Payer: Self-pay | Admitting: Family Medicine

## 2020-03-13 NOTE — Telephone Encounter (Signed)
Can you please assist Brad [name] with grief counseling and referral to Alcohol Anonymous due to ongoing alcohol abuse and bereavement.  He also needs some psychotherapy and cognitive behavioral therapy due to being depressed.  Thank you
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
## 2020-03-14 ENCOUNTER — Telehealth: Payer: Self-pay

## 2020-03-14 NOTE — Telephone Encounter (Signed)
Call received from patient stating that he received a letter from the [place] him that he has been selected for a Careers information officer.  He needs to contact the housing authority for additional information but he said that he has not been able to get through and will need to keep calling

## 2020-03-15 ENCOUNTER — Telehealth: Payer: Self-pay

## 2020-03-15 NOTE — Telephone Encounter (Signed)
Call received from the patient stating that he spoke to the [employer].  He said that he needs an ID, his social security card a copy of his birth certificate and a letter from social security and will then needs to schedule an interview. He did not have any information if there is a wait list after the interview if he is approved. He now needs to obtain a copy of his birth certificate in [place].

## 2020-03-16 ENCOUNTER — Emergency Department (HOSPITAL_COMMUNITY)
Admission: EM | Admit: 2020-03-16 | Discharge: 2020-03-16 | Disposition: A | Discharge status: Home / Self Care | Payer: Medicaid Other | Attending: Emergency Medicine | Admitting: Emergency Medicine

## 2020-03-16 ENCOUNTER — Emergency Department (HOSPITAL_COMMUNITY): Payer: Medicaid Other

## 2020-03-16 ENCOUNTER — Other Ambulatory Visit: Payer: Self-pay

## 2020-03-16 ENCOUNTER — Encounter (HOSPITAL_COMMUNITY): Payer: Self-pay

## 2020-03-16 DIAGNOSIS — Z79899 Other long term (current) drug therapy: Secondary | ICD-10-CM | POA: Diagnosis not present

## 2020-03-16 DIAGNOSIS — Y908 Blood alcohol level of 240 mg/100 ml or more: Secondary | ICD-10-CM | POA: Diagnosis not present

## 2020-03-16 DIAGNOSIS — Y999 Unspecified external cause status: Secondary | ICD-10-CM | POA: Insufficient documentation

## 2020-03-16 DIAGNOSIS — N183 Chronic kidney disease, stage 3 unspecified: Secondary | ICD-10-CM | POA: Insufficient documentation

## 2020-03-16 DIAGNOSIS — E1122 Type 2 diabetes mellitus with diabetic chronic kidney disease: Secondary | ICD-10-CM | POA: Insufficient documentation

## 2020-03-16 DIAGNOSIS — Z7984 Long term (current) use of oral hypoglycemic drugs: Secondary | ICD-10-CM | POA: Insufficient documentation

## 2020-03-16 DIAGNOSIS — F17210 Nicotine dependence, cigarettes, uncomplicated: Secondary | ICD-10-CM | POA: Insufficient documentation

## 2020-03-16 DIAGNOSIS — W1839XA Other fall on same level, initial encounter: Secondary | ICD-10-CM | POA: Diagnosis not present

## 2020-03-16 DIAGNOSIS — S0990XA Unspecified injury of head, initial encounter: Secondary | ICD-10-CM | POA: Insufficient documentation

## 2020-03-16 DIAGNOSIS — S01412A Laceration without foreign body of left cheek and temporomandibular area, initial encounter: Secondary | ICD-10-CM | POA: Insufficient documentation

## 2020-03-16 DIAGNOSIS — I130 Hypertensive heart and chronic kidney disease with heart failure and stage 1 through stage 4 chronic kidney disease, or unspecified chronic kidney disease: Secondary | ICD-10-CM | POA: Insufficient documentation

## 2020-03-16 DIAGNOSIS — Z23 Encounter for immunization: Secondary | ICD-10-CM | POA: Diagnosis not present

## 2020-03-16 DIAGNOSIS — F10129 Alcohol abuse with intoxication, unspecified: Secondary | ICD-10-CM | POA: Diagnosis present

## 2020-03-16 DIAGNOSIS — Y9389 Activity, other specified: Secondary | ICD-10-CM | POA: Diagnosis not present

## 2020-03-16 DIAGNOSIS — W19XXXA Unspecified fall, initial encounter: Secondary | ICD-10-CM

## 2020-03-16 DIAGNOSIS — I5042 Chronic combined systolic (congestive) and diastolic (congestive) heart failure: Secondary | ICD-10-CM | POA: Insufficient documentation

## 2020-03-16 DIAGNOSIS — Y92099 Unspecified place in other non-institutional residence as the place of occurrence of the external cause: Secondary | ICD-10-CM | POA: Insufficient documentation

## 2020-03-16 DIAGNOSIS — F10920 Alcohol use, unspecified with intoxication, uncomplicated: Secondary | ICD-10-CM

## 2020-03-16 LAB — CBC WITH DIFFERENTIAL/PLATELET
Abs Immature Granulocytes: 0.01 10*3/uL (ref 0.00–0.07)
Basophils Absolute: 0.1 10*3/uL (ref 0.0–0.1)
Basophils Relative: 1 %
Eosinophils Absolute: 0.1 10*3/uL (ref 0.0–0.5)
Eosinophils Relative: 3 %
HCT: 32.7 % — ABNORMAL LOW (ref 39.0–52.0)
Hemoglobin: 11.1 g/dL — ABNORMAL LOW (ref 13.0–17.0)
Immature Granulocytes: 0 %
Lymphocytes Relative: 16 %
Lymphs Abs: 0.6 10*3/uL — ABNORMAL LOW (ref 0.7–4.0)
MCH: 31.4 pg (ref 26.0–34.0)
MCHC: 33.9 g/dL (ref 30.0–36.0)
MCV: 92.6 fL (ref 80.0–100.0)
Monocytes Absolute: 0.7 10*3/uL (ref 0.1–1.0)
Monocytes Relative: 19 %
Neutro Abs: 2.2 10*3/uL (ref 1.7–7.7)
Neutrophils Relative %: 61 %
Platelets: 208 10*3/uL (ref 150–400)
RBC: 3.53 MIL/uL — ABNORMAL LOW (ref 4.22–5.81)
RDW: 13.9 % (ref 11.5–15.5)
WBC: 3.6 10*3/uL — ABNORMAL LOW (ref 4.0–10.5)
nRBC: 0 % (ref 0.0–0.2)

## 2020-03-16 LAB — BASIC METABOLIC PANEL
Anion gap: 16 — ABNORMAL HIGH (ref 5–15)
BUN: 12 mg/dL (ref 6–20)
CO2: 23 mmol/L (ref 22–32)
Calcium: 8.2 mg/dL — ABNORMAL LOW (ref 8.9–10.3)
Chloride: 85 mmol/L — ABNORMAL LOW (ref 98–111)
Creatinine, Ser: 1.73 mg/dL — ABNORMAL HIGH (ref 0.61–1.24)
GFR calc Af Amer: 49 mL/min — ABNORMAL LOW (ref >60)
GFR calc non Af Amer: 42 mL/min — ABNORMAL LOW (ref >60)
Glucose, Bld: 109 mg/dL — ABNORMAL HIGH (ref 70–99)
Potassium: 3.2 mmol/L — ABNORMAL LOW (ref 3.5–5.1)
Sodium: 124 mmol/L — ABNORMAL LOW (ref 135–145)

## 2020-03-16 LAB — ETHANOL: Alcohol, Ethyl (B): 266 mg/dL — ABNORMAL HIGH (ref <10)

## 2020-03-16 MED ORDER — TETANUS-DIPHTHERIA TOXOIDS TD 5-2 LFU IM INJ: 0.5000 mL | INJECTION | Freq: Once | INTRAMUSCULAR | Status: DC

## 2020-03-16 MED ORDER — TETANUS-DIPHTH-ACELL PERTUSSIS 5-2.5-18.5 LF-MCG/0.5 IM SUSP
0.5000 mL | Freq: Once | INTRAMUSCULAR | Status: AC
  Administered 2020-03-16: 0.5 mL via INTRAMUSCULAR
  Filled 2020-03-16: qty 0.5

## 2020-03-16 MED ORDER — THIAMINE HCL 100 MG/ML IJ SOLN
Freq: Once | INTRAVENOUS | Status: AC
  Filled 2020-03-16: qty 1000

## 2020-03-16 NOTE — Care Management (Signed)
Patient is being discharge and has no way home, Taxi voucher provided and called [employer] for transports.

## 2020-03-16 NOTE — ED Notes (Signed)
Social services at bedside arranging transport for pt via taxi.

## 2020-03-16 NOTE — Discharge Instructions (Signed)
Return for any problem.  Follow-up with your regular care provider as instructed.  Drink alcohol in moderation.

## 2020-03-16 NOTE — ED Notes (Signed)
Pt awake, A/Ox4. Pt ambulating with walker without assistance, steady. Pt placed in wheelchair.

## 2020-03-16 NOTE — ED Notes (Signed)
To CT at this time.  Pt remains alert and oriented  X's 4 s

## 2020-03-16 NOTE — ED Notes (Signed)
Pt returned from CT   No change in status

## 2020-03-16 NOTE — ED Triage Notes (Signed)
Per [facility] pt from home w/a mechanical fall approx 1500 today. Positive for ETOH, on Eliquis, hit Left temporal area, no LOC, no N/V.   110/80 110 99% RA 131 CBG
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
## 2020-03-19 ENCOUNTER — Other Ambulatory Visit (HOSPITAL_COMMUNITY): Payer: Self-pay

## 2020-03-19 NOTE — Progress Notes (Signed)
Paramedicine Encounter    Patient ID: Brad [name], male    DOB: Feb 11, 1961, 59 y.o.   MRN: [id number]   Patient Care Team: Charlott Rakes, MD as PCP - General (Family Medicine) Debara Pickett Nadean Corwin, MD as PCP - Cardiology (Cardiology) Charlott Rakes, MD (Family Medicine)  Patient Active Problem List   Diagnosis Date Noted  . Alcohol abuse with intoxication ([place]) 03/05/2020  . Chronic systolic CHF (congestive heart failure) ([place]) 10/31/2019  . Acute systolic CHF (congestive heart failure) ([place]) 08/02/2019  . Diarrhea 07/29/2019  . Gastroenteritis 07/22/2019  . Hypomagnesemia 06/19/2019  . Chest pain 06/07/2019  . Elevated troponin 05/23/2019  . Tobacco abuse 05/23/2019  . Alcohol abuse 02/21/2019  . Frequent falls 01/17/2019  . Severe mitral regurgitation   . Fall 11/01/2018  . Hyponatremia 10/31/2018  . Chronic atrial fibrillation   . Chronic hyponatremia 08/16/2018  . NSVT (nonsustained ventricular tachycardia) ([place])   . Concussion with loss of consciousness   . Scalp laceration   . Trauma   . Permanent atrial fibrillation   . Hypertensive heart disease   . Shortness of breath   . Pyogenic inflammation of bone ([place])   . Cirrhosis ([place]) 03/23/2018  . Right ankle pain 03/23/2018  . Syncope 08/07/2017  . Acute on chronic combined systolic and diastolic CHF (congestive heart failure) ([place]) 07/09/2017  . Atrial flutter ([place]) 07/09/2017  . Pre-syncope 07/08/2017  . Neuropathy 05/08/2017  . Substance induced mood disorder ([place]) 10/06/2016  . CVA (cerebral vascular accident) ([place]) 09/18/2016  . Left sided numbness   . Homelessness 08/21/2016  . S/P ORIF (open reduction internal fixation) fracture 08/01/2016  . CAD (coronary artery disease), native coronary artery 07/30/2016  . Surgery, elective   . Insomnia 07/22/2016  . NSTEMI (non-ST elevated myocardial infarction) ([place])   . Anemia 07/05/2016  . Thrombocytopenia ([place]) 07/05/2016  . Cocaine abuse ([place]) 07/02/2016  .  Chest pain on breathing 07/01/2016  . Essential hypertension 07/01/2016  . DM type 2 (diabetes mellitus, type 2) ([place]) 07/01/2016  . Hypokalemia 07/01/2016  . CKD (chronic kidney disease) stage 3, GFR 30-59 ml/min 07/01/2016  . Painful diabetic neuropathy ([place]) 07/01/2016  . Acute hyponatremia 05/27/2016  . Polysubstance abuse ([place]) 05/27/2016  . Chronic hepatitis C with cirrhosis ([place]) 05/27/2016  . Depression 04/21/2012  . GERD (gastroesophageal reflux disease) 02/16/2012  . History of drug abuse ([place])   . Heroin addiction ([place]) 01/29/2012    Current Outpatient Medications:  .  amiodarone (PACERONE) 200 MG tablet, Take 200 mg by mouth daily., Disp: , Rfl:  .  amitriptyline (ELAVIL) 10 MG tablet, Take 20 mg by mouth at bedtime., Disp: , Rfl:  .  atorvastatin (LIPITOR) 20 MG tablet, TAKE 1 TABLET (20 MG TOTAL) BY MOUTH DAILY AT 6 PM., Disp: 30 tablet, Rfl: 1 .  digoxin (LANOXIN) 0.125 MG tablet, Take 1 tablet (0.125 mg total) by mouth daily., Disp: 30 tablet, Rfl: 11 .  DULoxetine (CYMBALTA) 60 MG capsule, Take 1 capsule (60 mg total) by mouth daily., Disp: 30 capsule, Rfl: 6 .  gabapentin (NEURONTIN) 300 MG capsule, Take 2 capsules (600 mg total) by mouth 2 (two) times daily., Disp: 120 capsule, Rfl: 3 .  isosorbide-hydrALAZINE (BIDIL) 20-37.5 MG tablet, Take 1 tablet by mouth 3 (three) times daily., Disp: 90 tablet, Rfl: 11 .  mirtazapine (REMERON) 30 MG tablet, Take 30 mg by mouth at bedtime., Disp: , Rfl:  .  potassium chloride SA (KLOR-CON) 20 MEQ tablet, Take 1 tablet (20 mEq  total) by mouth daily., Disp:  , Rfl:  .  spironolactone (ALDACTONE) 25 MG tablet, Take 1 tablet (25 mg total) by mouth daily., Disp: 30 tablet, Rfl: 11 .  torsemide (DEMADEX) 20 MG tablet, Take 2 tablets (40 mg total) by mouth in the morning AND 1 tablet (20 mg total) every evening., Disp: 90 tablet, Rfl: 0 .  ACCU-CHEK AVIVA PLUS test strip, USE AS DIRECTED THREE TIMES DAILY (Patient not taking: Reported on  03/12/2020), Disp: 100 each, Rfl: 12 .  albuterol (VENTOLIN HFA) 108 (90 Base) MCG/ACT inhaler, Inhale 2 puffs into the lungs every 6 (six) hours as needed for shortness of breath. , Disp: , Rfl:  Allergies  Allergen Reactions  . Angiotensin Receptor Blockers Anaphylaxis and Other (See Comments)    (Angioedema also with Lisinopril, therefore ARB's are contraindicated)  . Lisinopril Anaphylaxis and Other (See Comments)    Throat swells  . Pamelor [Nortriptyline Hcl] Anaphylaxis and Swelling    Throat swells     Social History   Socioeconomic History  . Marital status: Married    Spouse name: Not on file  . Number of children: 3  . Years of education: 2y college  . Highest education level: Not on file  Occupational History  . 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 Smoking status: Current Every Day Smoker    Packs/day: 0.50    Years: 28.00    Pack years: 14.00    Types: Cigarettes  . Smokeless tobacco: Never Used  Substance and Sexual Activity  . Alcohol use: Yes    Alcohol/week: 1.0 standard drinks    Types: 1 Cans of beer per week    Comment: "I drink ~ 40oz beer/day"  . Drug use: Yes    Types: IV, Cocaine, Heroin    Comment: 08/17/2018 "no heroin since 2013; I use cocaine ~ once/month, last use 03/21/2019  . Sexual activity: Not Currently  Other Topics Concern  . Not on file  Social History Narrative   ** Merged History Encounter **       Incarcerated from 2006-2010, then 10/2011-12/2011.  Has been trying to get sober (no heroin, alcohol since 10/2011).    Social Determinants of Health   Financial Resource Strain:   . Difficulty of Paying Living Expenses:   Food Insecurity:   . Worried About Charity fundraiser in the Last Year:   . Arboriculturist in the Last Year:   Transportation Needs:   . Film/video editor (Medical):   Marland Kitchen Lack of Transportation (Non-Medical):   Physical Activity:   . Days of Exercise per Week:    . Minutes of Exercise per Session:   Stress:   . Feeling of Stress :   Social Connections:   . Frequency of Communication with Friends and Family:   . Frequency of Social Gatherings with Friends and Family:   . Attends Religious Services:   . Active Member of Clubs or Organizations:   . Attends Archivist Meetings:   Marland Kitchen Marital Status:   Intimate Partner Violence:   . Fear of Current or Ex-Partner:   . Emotionally Abused:   Marland Kitchen Physically Abused:   . Sexually Abused:     Physical Exam Vitals reviewed.  Constitutional:      Appearance: He is normal weight.  HENT:     Head: Normocephalic.     Comments: Left eye swollen with redness around eye  lid. Previous injury from fall.     Nose: Nose normal.     Mouth/Throat:     Mouth: Mucous membranes are moist.  Eyes:     Pupils: Pupils are equal, round, and reactive to light.  Cardiovascular:     Rate and Rhythm: Normal rate and regular rhythm.     Pulses: Normal pulses.     Heart sounds: Normal heart sounds.  Pulmonary:     Effort: Pulmonary effort is normal.     Breath sounds: Normal breath sounds.  Abdominal:     General: Abdomen is flat.     Palpations: Abdomen is soft.  Musculoskeletal:        General: Normal range of motion.     Cervical back: Normal range of motion.     Right lower leg: No edema.     Left lower leg: No edema.  Skin:    General: Skin is warm and dry.     Capillary Refill: Capillary refill takes less than 2 seconds.  Neurological:     Mental Status: He is alert. Mental status is at baseline.  Psychiatric:        Mood and Affect: Mood normal.    Arrived for home visit for Brad [name] who was alert and oriented standing outside of his apartment. Brad [name] reports having a fall over the weekend and striking his head resulting in an ER visit. Vitals were obtained. Patient denied dizziness, headache, vomiting, nausea, shortness of breath, chest pain or any other complaints. Medications were reviewed and  confirmed. Eliquis still being held until visit with HF clinic due to frequent falls. Fluid appears to be decreased this week. No weight gain. Legs look good. Pill box filled. I made Brad [name] aware of visit with PA at HF clinic on the 5th at 0930. His friend [name] stated he would bring him, I will meet patient there and fill pill box next week. Home visit complete.   No refills needed.   CBG- 139 (fasting)      Future Appointments  Date Time Provider [place]  03/26/2020  9:30 AM [facility] PA/NP [facility] None  03/29/2020  2:10 PM Mansouraty, Telford Nab., MD [facility]  04/02/2020  1:00 PM Brunetta Genera, MD [facility] None  04/09/2020 10:00 AM Trula Slade, DPM [facility]  04/12/2020 11:10 AM Charlott Rakes, MD [facility] None     ACTION: Home visit completed Next visit planned for one week in clinic

## 2020-03-21 ENCOUNTER — Telehealth: Payer: Self-pay | Admitting: Licensed Clinical Social Worker

## 2020-03-21 NOTE — Telephone Encounter (Signed)
Call placed to patient regarding IBH referral. Pt reports aunt recently passed and he was unable to attend the funeral service. Pt denied need for grief or substance use resources at this time. Pt would like to meet with LCSW after upcoming appointment with PCP on 04/12/2020.

## 2020-03-22 NOTE — Telephone Encounter (Signed)
Call placed to patient regarding IBH referral. Pt reports aunt recently passed and he was unable to attend the funeral service. Pt denied need for grief or substance use resources at this time. Pt would like to meet with LCSW after upcoming appointment with PCP on 04/12/2020.

## 2020-03-23 ENCOUNTER — Emergency Department (HOSPITAL_COMMUNITY): Payer: Medicaid Other

## 2020-03-23 ENCOUNTER — Inpatient Hospital Stay (HOSPITAL_COMMUNITY)
Admission: EM | Admit: 2020-03-23 | Discharge: 2020-03-26 | DRG: 641 | Disposition: A | Discharge status: Home Health | Payer: Medicaid Other | Attending: Internal Medicine | Admitting: Internal Medicine

## 2020-03-23 ENCOUNTER — Encounter (HOSPITAL_COMMUNITY): Payer: Self-pay | Admitting: Internal Medicine

## 2020-03-23 DIAGNOSIS — I509 Heart failure, unspecified: Secondary | ICD-10-CM

## 2020-03-23 DIAGNOSIS — I482 Chronic atrial fibrillation, unspecified: Secondary | ICD-10-CM | POA: Diagnosis present

## 2020-03-23 DIAGNOSIS — E1122 Type 2 diabetes mellitus with diabetic chronic kidney disease: Secondary | ICD-10-CM | POA: Diagnosis present

## 2020-03-23 DIAGNOSIS — Z7901 Long term (current) use of anticoagulants: Secondary | ICD-10-CM | POA: Diagnosis not present

## 2020-03-23 DIAGNOSIS — D649 Anemia, unspecified: Secondary | ICD-10-CM | POA: Diagnosis present

## 2020-03-23 DIAGNOSIS — E538 Deficiency of other specified B group vitamins: Secondary | ICD-10-CM | POA: Diagnosis present

## 2020-03-23 DIAGNOSIS — I10 Essential (primary) hypertension: Secondary | ICD-10-CM | POA: Diagnosis not present

## 2020-03-23 DIAGNOSIS — R296 Repeated falls: Secondary | ICD-10-CM | POA: Diagnosis present

## 2020-03-23 DIAGNOSIS — N183 Chronic kidney disease, stage 3 unspecified: Secondary | ICD-10-CM | POA: Diagnosis present

## 2020-03-23 DIAGNOSIS — S0081XA Abrasion of other part of head, initial encounter: Secondary | ICD-10-CM | POA: Diagnosis present

## 2020-03-23 DIAGNOSIS — W1830XA Fall on same level, unspecified, initial encounter: Secondary | ICD-10-CM | POA: Diagnosis present

## 2020-03-23 DIAGNOSIS — I130 Hypertensive heart and chronic kidney disease with heart failure and stage 1 through stage 4 chronic kidney disease, or unspecified chronic kidney disease: Secondary | ICD-10-CM | POA: Diagnosis present

## 2020-03-23 DIAGNOSIS — D72819 Decreased white blood cell count, unspecified: Secondary | ICD-10-CM | POA: Diagnosis present

## 2020-03-23 DIAGNOSIS — F10129 Alcohol abuse with intoxication, unspecified: Secondary | ICD-10-CM | POA: Diagnosis present

## 2020-03-23 DIAGNOSIS — F17210 Nicotine dependence, cigarettes, uncomplicated: Secondary | ICD-10-CM | POA: Diagnosis present

## 2020-03-23 DIAGNOSIS — I5042 Chronic combined systolic (congestive) and diastolic (congestive) heart failure: Secondary | ICD-10-CM | POA: Diagnosis present

## 2020-03-23 DIAGNOSIS — Z888 Allergy status to other drugs, medicaments and biological substances status: Secondary | ICD-10-CM | POA: Diagnosis not present

## 2020-03-23 DIAGNOSIS — E871 Hypo-osmolality and hyponatremia: Principal | ICD-10-CM | POA: Diagnosis present

## 2020-03-23 DIAGNOSIS — Y907 Blood alcohol level of 200-239 mg/100 ml: Secondary | ICD-10-CM | POA: Diagnosis present

## 2020-03-23 DIAGNOSIS — Z20822 Contact with and (suspected) exposure to covid-19: Secondary | ICD-10-CM | POA: Diagnosis present

## 2020-03-23 DIAGNOSIS — F419 Anxiety disorder, unspecified: Secondary | ICD-10-CM | POA: Diagnosis present

## 2020-03-23 DIAGNOSIS — S0990XA Unspecified injury of head, initial encounter: Secondary | ICD-10-CM

## 2020-03-23 DIAGNOSIS — E785 Hyperlipidemia, unspecified: Secondary | ICD-10-CM | POA: Diagnosis present

## 2020-03-23 DIAGNOSIS — N179 Acute kidney failure, unspecified: Secondary | ICD-10-CM | POA: Diagnosis present

## 2020-03-23 DIAGNOSIS — W19XXXA Unspecified fall, initial encounter: Secondary | ICD-10-CM

## 2020-03-23 DIAGNOSIS — F329 Major depressive disorder, single episode, unspecified: Secondary | ICD-10-CM | POA: Diagnosis present

## 2020-03-23 DIAGNOSIS — Y92007 Garden or yard of unspecified non-institutional (private) residence as the place of occurrence of the external cause: Secondary | ICD-10-CM

## 2020-03-23 DIAGNOSIS — F10130 Alcohol abuse with withdrawal, uncomplicated: Secondary | ICD-10-CM | POA: Diagnosis present

## 2020-03-23 HISTORY — DX: Type 2 diabetes mellitus without complications: E11.9

## 2020-03-23 HISTORY — DX: Essential (primary) hypertension: I10

## 2020-03-23 HISTORY — DX: Heart failure, unspecified: I50.9

## 2020-03-23 LAB — BASIC METABOLIC PANEL
Anion gap: 12 (ref 5–15)
Anion gap: 13 (ref 5–15)
Anion gap: 15 (ref 5–15)
BUN: 13 mg/dL (ref 6–20)
BUN: 11 mg/dL (ref 6–20)
BUN: 14 mg/dL (ref 6–20)
CO2: 23 mmol/L (ref 22–32)
CO2: 22 mmol/L (ref 22–32)
CO2: 19 mmol/L — ABNORMAL LOW (ref 22–32)
Calcium: 8.4 mg/dL — ABNORMAL LOW (ref 8.9–10.3)
Calcium: 8.1 mg/dL — ABNORMAL LOW (ref 8.9–10.3)
Calcium: 8.2 mg/dL — ABNORMAL LOW (ref 8.9–10.3)
Chloride: 82 mmol/L — ABNORMAL LOW (ref 98–111)
Chloride: 84 mmol/L — ABNORMAL LOW (ref 98–111)
Chloride: 85 mmol/L — ABNORMAL LOW (ref 98–111)
Creatinine, Ser: 1.43 mg/dL — ABNORMAL HIGH (ref 0.61–1.24)
Creatinine, Ser: 1.25 mg/dL — ABNORMAL HIGH (ref 0.61–1.24)
Creatinine, Ser: 1.28 mg/dL — ABNORMAL HIGH (ref 0.61–1.24)
GFR calc Af Amer: >60 mL/min (ref >60)
GFR calc Af Amer: >60 mL/min (ref >60)
GFR calc Af Amer: >60 mL/min (ref >60)
GFR calc non Af Amer: 53 mL/min — ABNORMAL LOW (ref >60)
GFR calc non Af Amer: >60 mL/min (ref >60)
GFR calc non Af Amer: >60 mL/min (ref >60)
Glucose, Bld: 126 mg/dL — ABNORMAL HIGH (ref 70–99)
Glucose, Bld: 125 mg/dL — ABNORMAL HIGH (ref 70–99)
Glucose, Bld: 142 mg/dL — ABNORMAL HIGH (ref 70–99)
Potassium: 4.6 mmol/L (ref 3.5–5.1)
Potassium: 4.2 mmol/L (ref 3.5–5.1)
Potassium: 5 mmol/L (ref 3.5–5.1)
Sodium: 117 mmol/L — CL (ref 135–145)
Sodium: 119 mmol/L — CL (ref 135–145)
Sodium: 119 mmol/L — CL (ref 135–145)

## 2020-03-23 LAB — URINALYSIS, ROUTINE W REFLEX MICROSCOPIC
Bacteria, UA: NONE SEEN
Bilirubin Urine: NEGATIVE
Glucose, UA: NEGATIVE mg/dL
Hgb urine dipstick: NEGATIVE
Ketones, ur: NEGATIVE mg/dL
Leukocytes,Ua: NEGATIVE
Nitrite: NEGATIVE
Protein, ur: 100 mg/dL — AB
Specific Gravity, Urine: 1.003 — ABNORMAL LOW (ref 1.005–1.030)
pH: 6 (ref 5.0–8.0)

## 2020-03-23 LAB — CBC WITH DIFFERENTIAL/PLATELET
Abs Immature Granulocytes: 0.02 10*3/uL (ref 0.00–0.07)
Basophils Absolute: 0 10*3/uL (ref 0.0–0.1)
Basophils Relative: 1 %
Eosinophils Absolute: 0.1 10*3/uL (ref 0.0–0.5)
Eosinophils Relative: 2 %
HCT: 33.8 % — ABNORMAL LOW (ref 39.0–52.0)
Hemoglobin: 11.8 g/dL — ABNORMAL LOW (ref 13.0–17.0)
Immature Granulocytes: 1 %
Lymphocytes Relative: 12 %
Lymphs Abs: 0.4 10*3/uL — ABNORMAL LOW (ref 0.7–4.0)
MCH: 31.7 pg (ref 26.0–34.0)
MCHC: 34.9 g/dL (ref 30.0–36.0)
MCV: 90.9 fL (ref 80.0–100.0)
Monocytes Absolute: 0.7 10*3/uL (ref 0.1–1.0)
Monocytes Relative: 22 %
Neutro Abs: 2.1 10*3/uL (ref 1.7–7.7)
Neutrophils Relative %: 62 %
Platelets: 208 10*3/uL (ref 150–400)
RBC: 3.72 MIL/uL — ABNORMAL LOW (ref 4.22–5.81)
RDW: 13.6 % (ref 11.5–15.5)
WBC: 3.4 10*3/uL — ABNORMAL LOW (ref 4.0–10.5)
nRBC: 0 % (ref 0.0–0.2)

## 2020-03-23 LAB — PHOSPHORUS: Phosphorus: 2.6 mg/dL (ref 2.5–4.6)

## 2020-03-23 LAB — CBC
HCT: 32.9 % — ABNORMAL LOW (ref 39.0–52.0)
Hemoglobin: 11.5 g/dL — ABNORMAL LOW (ref 13.0–17.0)
MCH: 31.6 pg (ref 26.0–34.0)
MCHC: 35 g/dL (ref 30.0–36.0)
MCV: 90.4 fL (ref 80.0–100.0)
Platelets: 201 10*3/uL (ref 150–400)
RBC: 3.64 MIL/uL — ABNORMAL LOW (ref 4.22–5.81)
RDW: 13.6 % (ref 11.5–15.5)
WBC: 2.7 10*3/uL — ABNORMAL LOW (ref 4.0–10.5)
nRBC: 0 % (ref 0.0–0.2)

## 2020-03-23 LAB — SODIUM, URINE, RANDOM: Sodium, Ur: <10 mmol/L

## 2020-03-23 LAB — CREATININE, SERUM
Creatinine, Ser: 1.3 mg/dL — ABNORMAL HIGH (ref 0.61–1.24)
GFR calc Af Amer: >60 mL/min (ref >60)
GFR calc non Af Amer: 59 mL/min — ABNORMAL LOW (ref >60)

## 2020-03-23 LAB — LIPID PANEL
Cholesterol: 137 mg/dL (ref 0–200)
HDL: 77 mg/dL (ref >40)
LDL Cholesterol: 45 mg/dL (ref 0–99)
Total CHOL/HDL Ratio: 1.8 RATIO
Triglycerides: 73 mg/dL (ref <150)
VLDL: 15 mg/dL (ref 0–40)

## 2020-03-23 LAB — OSMOLALITY, URINE: Osmolality, Ur: 130 mOsm/kg — ABNORMAL LOW (ref 300–900)

## 2020-03-23 LAB — TSH: TSH: 3.133 u[IU]/mL (ref 0.350–4.500)

## 2020-03-23 LAB — HIV ANTIBODY (ROUTINE TESTING W REFLEX): HIV Screen 4th Generation wRfx: NONREACTIVE

## 2020-03-23 LAB — VITAMIN B12: Vitamin B-12: 217 pg/mL (ref 180–914)

## 2020-03-23 LAB — SARS CORONAVIRUS 2 (TAT 6-24 HRS): SARS Coronavirus 2: NEGATIVE

## 2020-03-23 LAB — MAGNESIUM: Magnesium: 1.5 mg/dL — ABNORMAL LOW (ref 1.7–2.4)

## 2020-03-23 LAB — ETHANOL: Alcohol, Ethyl (B): 230 mg/dL — ABNORMAL HIGH (ref <10)

## 2020-03-23 LAB — OSMOLALITY: Osmolality: 305 mOsm/kg — ABNORMAL HIGH (ref 275–295)

## 2020-03-23 MED ORDER — ENOXAPARIN SODIUM 40 MG/0.4ML ~~LOC~~ SOLN
40.0000 mg | SUBCUTANEOUS | Status: DC
  Administered 2020-03-23 – 2020-03-25 (×3): 40 mg via SUBCUTANEOUS
  Filled 2020-03-23 (×3): qty 0.4

## 2020-03-23 MED ORDER — ONDANSETRON HCL 4 MG PO TABS: 4.0000 mg | ORAL_TABLET | Freq: Four times a day (QID) | ORAL | Status: DC | PRN

## 2020-03-23 MED ORDER — ACETAMINOPHEN 650 MG RE SUPP: 650.0000 mg | Freq: Four times a day (QID) | RECTAL | Status: DC | PRN

## 2020-03-23 MED ORDER — ONDANSETRON HCL 4 MG/2ML IJ SOLN: 4.0000 mg | Freq: Four times a day (QID) | INTRAMUSCULAR | Status: DC | PRN

## 2020-03-23 MED ORDER — SODIUM CHLORIDE 0.9 % IV SOLN: INTRAVENOUS | Status: DC

## 2020-03-23 MED ORDER — THIAMINE HCL 100 MG/ML IJ SOLN
Freq: Once | INTRAVENOUS | Status: AC
  Filled 2020-03-23: qty 1000

## 2020-03-23 MED ORDER — MAGNESIUM SULFATE 2 GM/50ML IV SOLN
2.0000 g | Freq: Once | INTRAVENOUS | Status: AC
  Administered 2020-03-23: 2 g via INTRAVENOUS
  Filled 2020-03-23: qty 50

## 2020-03-23 MED ORDER — ACETAMINOPHEN 325 MG PO TABS
650.0000 mg | ORAL_TABLET | Freq: Four times a day (QID) | ORAL | Status: DC | PRN
  Administered 2020-03-23 – 2020-03-26 (×3): 650 mg via ORAL
  Filled 2020-03-23 (×4): qty 2

## 2020-03-23 NOTE — ED Triage Notes (Signed)
Pt here from home for standing ground level fall as level II trauma, pt on eliquis. Pt was going to get his mail, fell onto R side, has lac next to R eyebrow and lac on R knee. ETOH +, aox4, vss

## 2020-03-23 NOTE — ED Provider Notes (Signed)
[name] Provider Note   CSN: [id number] Arrival date & time: 03/23/20  1344     History Chief Complaint  Patient presents with  . Fall    Brad [name] is a 59 y.o. male.  59 year old male with prior medical history as detailed below presents for evaluation following reported fall.  Patient arrives by EMS.  Patient reports that he had at least one "40" earlier today.  He appears intoxicated.  Patient reports that he fell in his yard while trying to get his mail.  He has a small abrasions of the right temple.  Of note, patient was seen by the same provider for similar fall on 3/26.    The history is provided by the patient, medical records and the EMS personnel.  Fall This is a new problem. The current episode started less than 1 hour ago. The problem occurs constantly. The problem has not changed since onset.Pertinent negatives include no chest pain and no abdominal pain. Nothing aggravates the symptoms. Nothing relieves the symptoms.       No past medical history on file.  There are no problems to display for this patient.      No family history on file.  Social History   Tobacco Use  . Smoking status: Not on file  Substance Use Topics  . Alcohol use: Not on file  . Drug use: Not on file    Home Medications Prior to Admission medications   Not on File    Allergies    Patient has no allergy information on record.  Review of Systems   Review of Systems  Cardiovascular: Negative for chest pain.  Gastrointestinal: Negative for abdominal pain.  All other systems reviewed and are negative.   Physical Exam Updated Vital Signs BP 117/77   Pulse (!) 55   Temp 97.9 F (36.6 C) (Oral)   Resp 17   Ht 5\' 11"  (1.803 m)   Wt 77.1 kg   SpO2 96%   BMI 23.71 kg/m   Physical Exam Vitals and nursing note reviewed.  Constitutional:      General: He is not in acute distress.    Appearance: Normal appearance. He is  well-developed.  HENT:     Head: Normocephalic.     Comments: Superficial laceration to the right temple. Eyes:     Conjunctiva/sclera: Conjunctivae normal.     Pupils: Pupils are equal, round, and reactive to light.  Cardiovascular:     Rate and Rhythm: Normal rate and regular rhythm.     Heart sounds: Normal heart sounds.  Pulmonary:     Effort: Pulmonary effort is normal. No respiratory distress.     Breath sounds: Normal breath sounds.  Abdominal:     General: There is no distension.     Palpations: Abdomen is soft.     Tenderness: There is no abdominal tenderness.  Musculoskeletal:        General: No deformity. Normal range of motion.     Cervical back: Normal range of motion and neck supple.  Skin:    General: Skin is warm and dry.  Neurological:     General: No focal deficit present.     Mental Status: He is alert and oriented to person, place, and time.     Comments: Appears intoxicated      ED Results / Procedures / Treatments   Labs (all labs ordered are listed, but only abnormal results are displayed) Labs Reviewed  ETHANOL -  Abnormal; Notable for the following components:      Result Value   Alcohol, Ethyl (B) 230 (*)    All other components within normal limits  CBC WITH DIFFERENTIAL/PLATELET - Abnormal; Notable for the following components:   WBC 3.4 (*)    RBC 3.72 (*)    Hemoglobin 11.8 (*)    HCT 33.8 (*)    Lymphs Abs 0.4 (*)    All other components within normal limits  BASIC METABOLIC PANEL - Abnormal; Notable for the following components:   Sodium 117 (*)    Chloride 82 (*)    Glucose, Bld 126 (*)    Creatinine, Ser 1.43 (*)    Calcium 8.4 (*)    GFR calc non Af Amer 53 (*)    All other components within normal limits  SARS CORONAVIRUS 2 (TAT 6-24 HRS)    EKG EKG Interpretation  Date/Time:  Friday March 23 2020 13:47:29 EDT Ventricular Rate:  54 PR Interval:    QRS Duration: 120 QT Interval:  445 QTC Calculation: 422 R  Axis:   88 Text Interpretation: Atrial fibrillation Nonspecific intraventricular conduction delay Probable anterior infarct, old Borderline repolarization abnormality Confirmed by Dene Gentry [phone]) on 03/23/2020 2:04:03 PM   Radiology DG Pelvis 1-2 Views  Result Date: 03/23/2020 CLINICAL DATA:  [place], found in yard, ethanol present EXAM: PELVIS - 1-2 VIEW COMPARISON:  Portable exam 1348 hours compared to 08/16/2018 FINDINGS: Osseous demineralization. Hip and SI joint spaces preserved. Minimal regularity at the lateral margins of the femoral head/neck junctions bilaterally likely degenerative. No acute fracture, dislocation or bone destruction. IMPRESSION: No acute abnormalities. Electronically Signed   By: Lavonia Dana M.D.   On: 03/23/2020 14:09   DG Chest Port 1 View  Result Date: 03/23/2020 CLINICAL DATA:  Pain following fall EXAM: PORTABLE CHEST 1 VIEW COMPARISON:  March 16, 2020 FINDINGS: Lungs are clear. There is cardiomegaly with pulmonary vascularity normal. No adenopathy. There is aortic atherosclerosis. No pneumothorax. No bone lesions. IMPRESSION: Stable cardiomegaly. Lungs clear. No pneumothorax. Aortic Atherosclerosis (ICD10-I70.0). Electronically Signed   By: Lowella Grip III M.D.   On: 03/23/2020 14:04    Procedures .Marland KitchenLaceration Repair  Date/Time: 03/23/2020 2:59 PM Performed by: Valarie Merino, MD Authorized by: Valarie Merino, MD   Consent:    Consent obtained:  Verbal   Consent given by:  Patient   Risks discussed:  Infection, need for additional repair, nerve damage, pain, poor cosmetic result, poor wound healing, retained foreign body, tendon damage and vascular damage   Alternatives discussed:  No treatment Laceration details:    Location: right temple    Length (cm):  1 Repair type:    Repair type:  Simple Pre-procedure details:    Preparation:  Patient was prepped and draped in usual sterile fashion Exploration:    Wound exploration: wound explored  through full range of motion     Contaminated: no   Treatment:    Area cleansed with:  Saline   Amount of cleaning:  Standard Skin repair:    Repair method:  Tissue adhesive Approximation:    Approximation:  Close Post-procedure details:    Dressing:  Open (no dressing)   Patient tolerance of procedure:  Tolerated well, no immediate complications   (including critical care time)  Medications Ordered in ED Medications  enoxaparin (LOVENOX) injection 40 mg (40 mg Subcutaneous Given 03/23/20 2027)  acetaminophen (TYLENOL) tablet 650 mg (650 mg Oral Given 03/24/20 0316)    Or  acetaminophen (  TYLENOL) suppository 650 mg ( Rectal See Alternative 03/24/20 0316)  ondansetron (ZOFRAN) tablet 4 mg (has no administration in time range)    Or  ondansetron (ZOFRAN) injection 4 mg (has no administration in time range)  amiodarone (PACERONE) tablet 200 mg (200 mg Oral Given 03/24/20 0852)  amitriptyline (ELAVIL) tablet 20 mg (has no administration in time range)  atorvastatin (LIPITOR) tablet 20 mg (has no administration in time range)  digoxin (LANOXIN) tablet 125 mcg (125 mcg Oral Given 03/24/20 0854)  DULoxetine (CYMBALTA) DR capsule 60 mg (60 mg Oral Given 03/24/20 0854)  gabapentin (NEURONTIN) capsule 600 mg (600 mg Oral Given 03/24/20 0855)  isosorbide-hydrALAZINE (BIDIL) 20-37.5 MG per tablet 1 tablet (1 tablet Oral Given 03/24/20 0856)  mirtazapine (REMERON) tablet 15 mg (has no administration in time range)  spironolactone (ALDACTONE) tablet 25 mg (25 mg Oral Given 03/24/20 0853)  LORazepam (ATIVAN) tablet 1-4 mg (1 mg Oral Given 03/24/20 0850)    Or  LORazepam (ATIVAN) injection 1-4 mg ( Intravenous See Alternative 03/24/20 0850)  thiamine tablet 100 mg (100 mg Oral Given 03/24/20 0856)    Or  thiamine (B-1) injection 100 mg ( Intravenous See Alternative [id number])  folic acid (FOLVITE) tablet 1 mg (1 mg Oral Given 03/24/20 0855)  multivitamin with minerals tablet 1 tablet (1 tablet Oral Given 03/24/20  0855)  LORazepam (ATIVAN) injection 0-4 mg (0 mg Intravenous Not Given 03/24/20 0856)    Followed by  LORazepam (ATIVAN) injection 0-4 mg (has no administration in time range)  HYDROcodone-acetaminophen (NORCO/VICODIN) 5-325 MG per tablet 1 tablet (1 tablet Oral Given 03/24/20 0850)  sodium chloride 0.9 % 1,000 mL with thiamine [id number] mg, folic acid 1 mg, multivitamins adult 10 mL infusion ( Intravenous New Bag/Given 03/23/20 1548)  magnesium sulfate IVPB 2 g 50 mL (0 g Intravenous Stopped 03/23/20 2110)  diazepam (VALIUM) injection 5 mg (5 mg Intravenous Given 03/24/20 0317)  hydrALAZINE (APRESOLINE) injection 10 mg (10 mg Intravenous Given 03/24/20 0654)    ED Course  I have reviewed the triage vital signs and the nursing notes.  Pertinent labs & imaging results that were available during my care of the patient were reviewed by me and considered in my medical decision making (see chart for details).    MDM Rules/Calculators/A&P                      MDM  Screen complete  Brad [name] was evaluated in Emergency Department on 03/23/2020 for the symptoms described in the history of present illness. He was evaluated in the context of the global COVID-19 pandemic, which necessitated consideration that the patient might be at risk for infection with the SARS-CoV-2 virus that causes COVID-19. Institutional protocols and algorithms that pertain to the evaluation of patients at risk for COVID-19 are in a state of rapid change based on information released by regulatory bodies including the CDC and federal and state organizations. These policies and algorithms were followed during the patient's care in the ED.    Patient is presenting for evaluation following reported fall.  Patient is clinically intoxicated upon arrival.  Patient with minimal laceration sustained to the right temple.  This was repaired with Dermabond.  Imaging studies did not demonstrate evidence of significant traumatic injury.  However,  screening labs did show worsening hyponatremia.  Patient will be admitted to the medicine service for further work-up and treatment.    Final Clinical Impression(s) / ED Diagnoses Final diagnoses:  Fall, initial  encounter  Injury of head, initial encounter  Hyponatremia    Rx / DC Orders ED Discharge Orders    None       Valarie Merino, MD 03/24/20 1258

## 2020-03-23 NOTE — H&P (Addendum)
History and Physical    Brad [name] [id number] DOB: 04-15-1961 DOA: 03/23/2020  PCP: Patient, No Pcp Per  Patient coming from: Home I have personally briefly reviewed patient's old medical records in [facility]  Chief Complaint: Fall  HPI: Brad [name] is a 59 y.o. male with medical history significant of diet-controlled diabetes mellitus, systolic CHF with ejection fraction of 25 to 30%, A. fib on Eliquis (s/p TEE and cardioversion), CKD stage III, chronic hyponatremia, recurrent falls, hypertension, tobacco abuse, alcohol abuse, cocaine abuse, depression/anxiety, hyperlipidemia presents to emergency department after  fall.  Patient tells me that he was going to get his mail this morning and then he fell on his right side.  He has small abrasion on right temporal region.  He tells me that he has been stressed out lately as his aunt passed out recently and he could not go to funeral.  He drinks 40 ounces of beer now and then.  Denies history of depression, suicidal or homicidal thoughts.  He tells me that he wants to quit drinking & asking for medicines to help him stop drinking.  He smokes half pack of cigarettes per day, denies illicit drug use.  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  Reports severe headache however denies blurry vision, chest pain, shortness of breath, palpitation, nausea, vomiting, fever, chills, cough, congestion, urinary or bowel changes.  Recently admitted with acute on chronic hyponatremia, recurrent falls due to alcohol abuse.  His Eliquis was held due to recurrent falls.  ED Course: Upon arrival to ED: Patient's heart rate in 50s, elevated ethanol level, CBC shows WBC of 3.4, H&H 11.8/33.8, sodium of 117, AKI, COVID-19 pending.  CT head/cervical spine and x-ray of pelvis negative for acute findings.  [facility] hospitalist consulted for severe hyponatremia and alcohol intoxication.  Review of Systems: As per HPI otherwise  negative.    Past Medical History:  Diagnosis Date  . Atrial fibrillation ([place])   . CHF (congestive heart failure) ([place])   . DM (diabetes mellitus) ([place])   . HTN (hypertension)     History reviewed. No pertinent surgical history.   has no history on file for tobacco, alcohol, and drug.  Not on File  History reviewed. No pertinent family history.  Prior to Admission medications   Medication Sig Start Date End Date Taking? Authorizing Provider  acetaminophen-codeine (TYLENOL #3) 300-30 MG tablet Take 1 tablet by mouth every 6 (six) hours as needed. 02/24/20   [provider]  amiodarone (PACERONE) 200 MG tablet TAKE 1 TABLET (200 MG TOTAL) BY MOUTH 2 (TWO) TIMES DAILY FOR 7 DAYS, THEN 1 TABLET (200 MG TOTAL) DAILY. 02/28/20   [provider]  atorvastatin (LIPITOR) 20 MG tablet Take 20 mg by mouth at bedtime. 03/15/20   [provider]  BIDIL 20-37.5 MG tablet TAKE 1 TABLET BY MOUTH 3 (THREE) TIMES DAILY. 02/28/20   [provider]  dicyclomine (BENTYL) 20 MG tablet Take 20 mg by mouth 2 (two) times daily as needed. 02/21/20   [provider]  digoxin (LANOXIN) 0.125 MG tablet Take 125 mcg by mouth daily. 03/15/20   [provider]  diltiazem (CARDIZEM) 30 MG tablet Take 30 mg by mouth 2 (two) times daily. 10/05/19   [provider]  DULoxetine (CYMBALTA) 60 MG capsule Take 60 mg by mouth daily. 02/28/20   [provider]  ELIQUIS 5 MG TABS tablet Take 5 mg by mouth 2 (two)  times daily. 03/15/20   [provider]  isosorbide mononitrate (IMDUR) 30 MG 24 hr tablet Take 30 mg by mouth daily. 10/05/19   [provider]  mirtazapine (REMERON) 15 MG tablet Take 15 mg by mouth at bedtime. 02/22/20   [provider]  ondansetron (ZOFRAN) 4 MG tablet Take 4 mg by mouth every 8 (eight) hours as needed. 02/21/20   [provider]  oxyCODONE (OXY IR/ROXICODONE) 5 MG immediate release tablet Take 5 mg by  mouth every 4 (four) hours as needed. 02/04/20   [provider]  oxyCODONE-acetaminophen (PERCOCET/ROXICET) 5-325 MG tablet SMARTSIG:1 Pill By Mouth Every 6-8 Hours PRN 09/30/19   [provider]  potassium chloride SA (KLOR-CON) 20 MEQ tablet Take 20 mEq by mouth daily. 03/15/20   [provider]  spironolactone (ALDACTONE) 25 MG tablet Take 25 mg by mouth daily. 03/12/20   [provider]  torsemide (DEMADEX) 20 MG tablet TAKE 4 TABLETS (80 MG TOTAL) BY MOUTH EVERY MORNING AND 3 TABLETS (60 MG TOTAL) EVERY EVENING. 02/28/20   [provider]  zolpidem (AMBIEN) 5 MG tablet Take 5 mg by mouth at bedtime as needed. 02/14/20   [provider]    Physical Exam: Vitals:   03/23/20 1630 03/23/20 1645 03/23/20 1730 03/23/20 1735  BP: (!) 146/95 (!) 149/88 (!) 147/89 (!) 147/89  Pulse:  (!) 56 (!) 59 62  Resp: 14 13 17    Temp:      TempSrc:      SpO2:  95% 96%   Weight:      Height:        Constitutional: NAD, calm, comfortable, obese, communicating well Eyes: PERRL, lids and conjunctivae normal ENMT: Mucous membranes are moist. Posterior pharynx clear of any exudate or lesions.Normal dentition.  Neck: normal, supple, no masses, no thyromegaly Respiratory: clear to auscultation bilaterally, no wheezing, no crackles. Normal respiratory effort. No accessory muscle use.  Cardiovascular: Regular rate and rhythm, no murmurs / rubs / gallops.  1+ pitting edema positive. 2+ pedal pulses. No carotid bruits.  Abdomen: no tenderness, no masses palpated. No hepatosplenomegaly. Bowel sounds positive.  Musculoskeletal: no clubbing / cyanosis. No joint deformity upper and lower extremities. Good ROM, no contractures. Normal muscle tone.  Skin: no rashes, lesions, ulcers. No induration Neurologic: CN 2-12 grossly intact. Sensation intact, DTR normal. Strength 5/5 in all 4.  Psychiatric: Normal judgment and insight. Alert and oriented x 3. Normal mood.     Labs on Admission: I have personally reviewed following labs and imaging studies  CBC: Recent Labs  Lab 03/23/20 1403  WBC 3.4*  NEUTROABS 2.1  HGB 11.8*  HCT 33.8*  MCV 90.9  PLT 123XX123   Basic Metabolic Panel: Recent Labs  Lab 03/23/20 1403  NA 117*  K 4.6  CL 82*  CO2 23  GLUCOSE 126*  BUN 13  CREATININE 1.43*  CALCIUM 8.4*   GFR: Estimated Creatinine Clearance: 58.5 mL/min (A) (by C-G formula based on SCr of 1.43 mg/dL (H)). Liver Function Tests: No results for input(s): AST, ALT, ALKPHOS, BILITOT, PROT, ALBUMIN in the last 168 hours. No results for input(s): LIPASE, AMYLASE in the last 168 hours. No results for input(s): AMMONIA in the last 168 hours. Coagulation Profile: No results for input(s): INR, PROTIME in the last 168 hours. Cardiac Enzymes: No results for input(s): CKTOTAL, CKMB, CKMBINDEX, TROPONINI in the last 168 hours. BNP (last 3 results) No results for input(s): PROBNP in the last 8760 hours. HbA1C: No  results for input(s): HGBA1C in the last 72 hours. CBG: No results for input(s): GLUCAP in the last 168 hours. Lipid Profile: No results for input(s): CHOL, HDL, LDLCALC, TRIG, CHOLHDL, LDLDIRECT in the last 72 hours. Thyroid Function Tests: No results for input(s): TSH, T4TOTAL, FREET4, T3FREE, THYROIDAB in the last 72 hours. Anemia Panel: No results for input(s): VITAMINB12, FOLATE, FERRITIN, TIBC, IRON, RETICCTPCT in the last 72 hours. Urine analysis: No results found for: COLORURINE, APPEARANCEUR, LABSPEC, [place], GLUCOSEU, [place], BILIRUBINUR, [place], [place], [place], NITRITE, LEUKOCYTESUR  Radiological Exams on Admission: DG Pelvis 1-2 Views  Result Date: 03/23/2020 CLINICAL DATA:  [place], found in yard, ethanol present EXAM: PELVIS - 1-2 VIEW COMPARISON:  Portable exam 1348 hours compared to 08/16/2018 FINDINGS: Osseous demineralization. Hip and SI joint spaces preserved. Minimal regularity at the lateral margins of the femoral  head/neck junctions bilaterally likely degenerative. No acute fracture, dislocation or bone destruction. IMPRESSION: No acute abnormalities. Electronically Signed   By: Lavonia Dana M.D.   On: 03/23/2020 14:09   CT Head Wo Contrast  Result Date: 03/23/2020 CLINICAL DATA:  Ground level fall EXAM: CT HEAD WITHOUT CONTRAST CT CERVICAL SPINE WITHOUT CONTRAST TECHNIQUE: Multidetector CT imaging of the head and cervical spine was performed following the standard protocol without intravenous contrast. Multiplanar CT image reconstructions of the cervical spine were also generated. COMPARISON:  CT brain and cervical spine 03/16/2020 FINDINGS: CT HEAD FINDINGS Brain: No acute territorial infarction, hemorrhage, or intracranial mass. Mild atrophy. Patchy hypodensity in the white matter consistent with chronic small vessel ischemic change. The ventricles are nonenlarged Vascular: No hyperdense vessels.  Carotid vascular calcification. Skull: Normal. Negative for fracture or focal lesion. Sinuses/Orbits: Mild mucosal thickening in the maxillary and ethmoid sinuses Other: None CT CERVICAL SPINE FINDINGS Alignment: Mild motion degradation. Sagittal alignment is stable with mild reversal of cervical lordosis. Facet alignment is maintained. There is rotated appearance of C1 on C2 which is probably positional. Skull base and vertebrae: No acute fracture. No primary bone lesion or focal pathologic process. Soft tissues and spinal canal: No prevertebral fluid or swelling. No visible canal hematoma. Disc levels: Mild to moderate degenerative changes at C4-C5 and C5-C6. Posterior disc osteophyte complex at C4-C5 and C5-C6 results in at least mild canal stenosis at C4-C5 and moderate canal stenosis at C5-C6. Facet degenerative changes at multiple levels with bilateral foraminal stenosis, mild at C4-C5 and moderate at C5-C6. Upper chest: Negative. Other: Carotid vascular calcification. IMPRESSION: 1. No CT evidence for acute intracranial  abnormality. Atrophy and chronic small vessel ischemic changes of the white matter. 2. Degenerative changes of the cervical spine. No definitive fracture is seen. Rotated appearance of C1 on C2 is similar as compared with 03/16/2020 and suspected to be secondary to positioning. Electronically Signed   By: Donavan Foil M.D.   On: 03/23/2020 15:40   CT Cervical Spine Wo Contrast  Result Date: 03/23/2020 CLINICAL DATA:  Ground level fall EXAM: CT HEAD WITHOUT CONTRAST CT CERVICAL SPINE WITHOUT CONTRAST TECHNIQUE: Multidetector CT imaging of the head and cervical spine was performed following the standard protocol without intravenous contrast. Multiplanar CT image reconstructions of the cervical spine were also generated. COMPARISON:  CT brain and cervical spine 03/16/2020 FINDINGS: CT HEAD FINDINGS Brain: No acute territorial infarction, hemorrhage, or intracranial mass. Mild atrophy. Patchy hypodensity in the white matter consistent with chronic small vessel ischemic change. The ventricles are nonenlarged Vascular: No hyperdense vessels.  Carotid vascular calcification. Skull: Normal. Negative for fracture or focal lesion. Sinuses/Orbits: Mild  mucosal thickening in the maxillary and ethmoid sinuses Other: None CT CERVICAL SPINE FINDINGS Alignment: Mild motion degradation. Sagittal alignment is stable with mild reversal of cervical lordosis. Facet alignment is maintained. There is rotated appearance of C1 on C2 which is probably positional. Skull base and vertebrae: No acute fracture. No primary bone lesion or focal pathologic process. Soft tissues and spinal canal: No prevertebral fluid or swelling. No visible canal hematoma. Disc levels: Mild to moderate degenerative changes at C4-C5 and C5-C6. Posterior disc osteophyte complex at C4-C5 and C5-C6 results in at least mild canal stenosis at C4-C5 and moderate canal stenosis at C5-C6. Facet degenerative changes at multiple levels with bilateral foraminal stenosis,  mild at C4-C5 and moderate at C5-C6. Upper chest: Negative. Other: Carotid vascular calcification. IMPRESSION: 1. No CT evidence for acute intracranial abnormality. Atrophy and chronic small vessel ischemic changes of the white matter. 2. Degenerative changes of the cervical spine. No definitive fracture is seen. Rotated appearance of C1 on C2 is similar as compared with 03/16/2020 and suspected to be secondary to positioning. Electronically Signed   By: Donavan Foil M.D.   On: 03/23/2020 15:40   DG Chest Port 1 View  Result Date: 03/23/2020 CLINICAL DATA:  Pain following fall EXAM: PORTABLE CHEST 1 VIEW COMPARISON:  March 16, 2020 FINDINGS: Lungs are clear. There is cardiomegaly with pulmonary vascularity normal. No adenopathy. There is aortic atherosclerosis. No pneumothorax. No bone lesions. IMPRESSION: Stable cardiomegaly. Lungs clear. No pneumothorax. Aortic Atherosclerosis (ICD10-I70.0). Electronically Signed   By: Lowella Grip III M.D.   On: 03/23/2020 14:04    EKG: Independently reviewed.  A. fib.  Nonspecific conduction delay.  No ST elevation or depression noted.  Assessment/Plan Active Problems:   Alcohol abuse with intoxication ([place])   Hyponatremia   Fall   Normocytic anemia   Leukopenia   HTN (hypertension)   HLD (hyperlipidemia)   AKI (acute kidney injury) ([place])   CHF (congestive heart failure) ([facility])    Acute on chronic hyponatremia: -Sodium: 117.  Likely secondary to beer potomania. His alcohol level is elevated. -He is alert and oriented. -Admit patient on stepdown unit for close monitoring.  Hold diuretics for now.  Gentle hydration.   -Slow correction.  Cautious IV hydration due to history of CHF with low ejection fraction.   -BMP every 2 hour.   -Check urine osmolality, serum osmolality, urine sodium, TSH, lipid panel   Recurrent falls: In the setting of alcohol abuse.  Patient has abrasion around right eye -Hold anticoagulation for now due to history of  recurrent falls. -CT head/cervical spine and x-ray of pelvis came back negative for acute findings. -On fall precautions -Consulted PT/OT  Alcohol abuse with intoxication: -Ethanol level elevated -Patient received IV thiamine, multivitamin, folic acid in ED -On CIWA protocol.  On seizure precautions -Discussed about cessation  Normocytic anemia: H&H 11.8/33.8 -Continue to monitor.  Check B12, folate, iron studies  Hypertension: Blood pressure is stable -Continue home meds.  Hold diuretics due to hyponatremia  Well-controlled diabetes mellitus: Not on medications at home -Continue to monitor.  Chronic A. Fib: -On Bidil, amiodarone.  His Eliquis was hold on previous admission due to recurrent falls.  Leukopenia: Likely secondary to alcohol abuse -Continue to monitor  CKD stage III: Stable.  Continue to monitor.  Combined systolic congestive heart failure: Patient is euvolemic -We will continue his home meds except diuretics due to hyponatremia -Strict INO's and daily weight.  Check electrolytes -Monitor signs for fluid overload  Hypomagnesemia:  Replenished  Tobacco abuse: Counseled about cessation  Polysubstance abuse: We will check UDS  Unable to safely start patient's home medication as med reconciliation is pending by pharmacy.  Please note: I talked to floor coverage Dr. Elisha Headland asked to keep an eye on patient sodium level.  Discontinue IV fluids-if there is rapid sodium correction.   DVT prophylaxis: SCD/TED Code Status: Full code  family Communication: None present at bedside.  Plan of care discussed with patient in length and he verbalized understanding and agreed with it. Disposition Plan: To be determined  consults called: None Admission status: Inpatient   Mckinley Jewel MD [facility] Pager [phone]  If 7PM-7AM, please contact night-coverage [email or web address] Password [facility]  03/23/2020, 6:02 PM

## 2020-03-23 NOTE — ED Notes (Signed)
Pt transported to CT ?

## 2020-03-23 NOTE — Plan of Care (Signed)
Pt admitted to unit @ approximately 2005. A/Ox4, RA, and continent. C/o headache 10/10, Tylenol administered and pt experienced relief of symptoms. IVF began and pt placed on heart monitor. Pt calm and resting.  Problem: Education: Goal: Knowledge of General Education information will improve Description: Including pain rating scale, medication(s)/side effects and non-pharmacologic comfort measures Outcome: Progressing   Problem: Health Behavior/Discharge Planning: Goal: Ability to manage health-related needs will improve Outcome: Progressing   Problem: Clinical Measurements: Goal: Ability to maintain clinical measurements within normal limits will improve Outcome: Progressing Goal: Diagnostic test results will improve Outcome: Progressing   Problem: Activity: Goal: Risk for activity intolerance will decrease Outcome: Progressing   Problem: Pain Managment: Goal: General experience of comfort will improve Outcome: Progressing   Problem: Safety: Goal: Ability to remain free from injury will improve Outcome: Progressing

## 2020-03-23 NOTE — Progress Notes (Signed)
Orthopedic Tech Progress Note Patient Details:  Juris Sermersheim 1961/01/16 [id number] Level 2 trauma upgraded to level 1 Patient ID: [name], male   DOB: 11/03/1961, 59 y.o.   MRN: [id number]   [name] 03/23/2020, 2:36 PM

## 2020-03-24 ENCOUNTER — Encounter (HOSPITAL_COMMUNITY): Payer: Self-pay | Admitting: Internal Medicine

## 2020-03-24 DIAGNOSIS — N179 Acute kidney failure, unspecified: Secondary | ICD-10-CM

## 2020-03-24 DIAGNOSIS — W19XXXA Unspecified fall, initial encounter: Secondary | ICD-10-CM

## 2020-03-24 DIAGNOSIS — D72819 Decreased white blood cell count, unspecified: Secondary | ICD-10-CM

## 2020-03-24 LAB — BASIC METABOLIC PANEL
Anion gap: 13 (ref 5–15)
Anion gap: 14 (ref 5–15)
BUN: 11 mg/dL (ref 6–20)
BUN: 11 mg/dL (ref 6–20)
CO2: 23 mmol/L (ref 22–32)
CO2: 24 mmol/L (ref 22–32)
Calcium: 8.2 mg/dL — ABNORMAL LOW (ref 8.9–10.3)
Calcium: 8.9 mg/dL (ref 8.9–10.3)
Chloride: 87 mmol/L — ABNORMAL LOW (ref 98–111)
Chloride: 87 mmol/L — ABNORMAL LOW (ref 98–111)
Creatinine, Ser: 1.31 mg/dL — ABNORMAL HIGH (ref 0.61–1.24)
Creatinine, Ser: 1.28 mg/dL — ABNORMAL HIGH (ref 0.61–1.24)
GFR calc Af Amer: >60 mL/min (ref >60)
GFR calc Af Amer: >60 mL/min (ref >60)
GFR calc non Af Amer: 59 mL/min — ABNORMAL LOW (ref >60)
GFR calc non Af Amer: >60 mL/min (ref >60)
Glucose, Bld: 117 mg/dL — ABNORMAL HIGH (ref 70–99)
Glucose, Bld: 144 mg/dL — ABNORMAL HIGH (ref 70–99)
Potassium: 4.1 mmol/L (ref 3.5–5.1)
Potassium: 4.4 mmol/L (ref 3.5–5.1)
Sodium: 123 mmol/L — ABNORMAL LOW (ref 135–145)
Sodium: 125 mmol/L — ABNORMAL LOW (ref 135–145)

## 2020-03-24 LAB — CBC
HCT: 34.6 % — ABNORMAL LOW (ref 39.0–52.0)
Hemoglobin: 11.9 g/dL — ABNORMAL LOW (ref 13.0–17.0)
MCH: 30.8 pg (ref 26.0–34.0)
MCHC: 34.4 g/dL (ref 30.0–36.0)
MCV: 89.6 fL (ref 80.0–100.0)
Platelets: 201 10*3/uL (ref 150–400)
RBC: 3.86 MIL/uL — ABNORMAL LOW (ref 4.22–5.81)
RDW: 13.5 % (ref 11.5–15.5)
WBC: 2.8 10*3/uL — ABNORMAL LOW (ref 4.0–10.5)
nRBC: 0 % (ref 0.0–0.2)

## 2020-03-24 LAB — RAPID URINE DRUG SCREEN, HOSP PERFORMED
Amphetamines: NOT DETECTED
Barbiturates: NOT DETECTED
Benzodiazepines: NOT DETECTED
Cocaine: NOT DETECTED
Opiates: NOT DETECTED
Tetrahydrocannabinol: NOT DETECTED

## 2020-03-24 MED ORDER — ATORVASTATIN CALCIUM 10 MG PO TABS
20.0000 mg | ORAL_TABLET | Freq: Every day | ORAL | Status: DC
  Administered 2020-03-24 – 2020-03-25 (×2): 20 mg via ORAL
  Filled 2020-03-24 (×2): qty 2

## 2020-03-24 MED ORDER — MIRTAZAPINE 15 MG PO TABS
15.0000 mg | ORAL_TABLET | Freq: Every day | ORAL | Status: DC
  Administered 2020-03-24 – 2020-03-25 (×2): 15 mg via ORAL
  Filled 2020-03-24 (×2): qty 1

## 2020-03-24 MED ORDER — THIAMINE HCL 100 MG PO TABS
100.0000 mg | ORAL_TABLET | Freq: Every day | ORAL | Status: DC
  Administered 2020-03-24 – 2020-03-26 (×3): 100 mg via ORAL
  Filled 2020-03-24 (×3): qty 1

## 2020-03-24 MED ORDER — LORAZEPAM 1 MG PO TABS
1.0000 mg | ORAL_TABLET | ORAL | Status: DC | PRN
  Administered 2020-03-24: 3 mg via ORAL
  Administered 2020-03-24: 1 mg via ORAL
  Administered 2020-03-24: 3 mg via ORAL
  Administered 2020-03-24: 22:00:00 2 mg via ORAL
  Filled 2020-03-24 (×3): qty 3
  Filled 2020-03-24: qty 1
  Filled 2020-03-24 (×2): qty 2

## 2020-03-24 MED ORDER — LORAZEPAM 2 MG/ML IJ SOLN
0.0000 mg | INTRAMUSCULAR | Status: AC
  Administered 2020-03-25: 2 mg via INTRAVENOUS

## 2020-03-24 MED ORDER — DULOXETINE HCL 60 MG PO CPEP
60.0000 mg | ORAL_CAPSULE | Freq: Every day | ORAL | Status: DC
  Administered 2020-03-24 – 2020-03-26 (×3): 60 mg via ORAL
  Filled 2020-03-24 (×3): qty 1

## 2020-03-24 MED ORDER — LORAZEPAM 2 MG/ML IJ SOLN: 0.0000 mg | Freq: Three times a day (TID) | INTRAMUSCULAR | Status: DC

## 2020-03-24 MED ORDER — AMIODARONE HCL 200 MG PO TABS
200.0000 mg | ORAL_TABLET | Freq: Every day | ORAL | Status: DC
  Administered 2020-03-24 – 2020-03-26 (×3): 200 mg via ORAL
  Filled 2020-03-24 (×3): qty 1

## 2020-03-24 MED ORDER — FOLIC ACID 1 MG PO TABS
1.0000 mg | ORAL_TABLET | Freq: Every day | ORAL | Status: DC
  Administered 2020-03-24 – 2020-03-26 (×3): 1 mg via ORAL
  Filled 2020-03-24 (×3): qty 1

## 2020-03-24 MED ORDER — HYDROCODONE-ACETAMINOPHEN 5-325 MG PO TABS
1.0000 | ORAL_TABLET | Freq: Four times a day (QID) | ORAL | Status: DC | PRN
  Administered 2020-03-24 – 2020-03-26 (×6): 1 via ORAL
  Filled 2020-03-24 (×7): qty 1

## 2020-03-24 MED ORDER — ISOSORB DINITRATE-HYDRALAZINE 20-37.5 MG PO TABS
1.0000 | ORAL_TABLET | Freq: Two times a day (BID) | ORAL | Status: DC
  Administered 2020-03-24 – 2020-03-26 (×5): 1 via ORAL
  Filled 2020-03-24 (×5): qty 1

## 2020-03-24 MED ORDER — DIGOXIN 125 MCG PO TABS
125.0000 ug | ORAL_TABLET | Freq: Every day | ORAL | Status: DC
  Administered 2020-03-24 – 2020-03-26 (×3): 125 ug via ORAL
  Filled 2020-03-24 (×3): qty 1

## 2020-03-24 MED ORDER — AMITRIPTYLINE HCL 10 MG PO TABS
20.0000 mg | ORAL_TABLET | Freq: Every day | ORAL | Status: DC
  Administered 2020-03-24 – 2020-03-25 (×2): 20 mg via ORAL
  Filled 2020-03-24 (×3): qty 2

## 2020-03-24 MED ORDER — SPIRONOLACTONE 25 MG PO TABS
25.0000 mg | ORAL_TABLET | Freq: Every day | ORAL | Status: DC
  Administered 2020-03-24 – 2020-03-26 (×3): 25 mg via ORAL
  Filled 2020-03-24 (×4): qty 1

## 2020-03-24 MED ORDER — LORAZEPAM 2 MG/ML IJ SOLN: 1.0000 mg | INTRAMUSCULAR | Status: DC | PRN

## 2020-03-24 MED ORDER — DIAZEPAM 5 MG/ML IJ SOLN
5.0000 mg | Freq: Once | INTRAMUSCULAR | Status: AC
  Administered 2020-03-24: 5 mg via INTRAVENOUS
  Filled 2020-03-24: qty 2

## 2020-03-24 MED ORDER — ADULT MULTIVITAMIN W/MINERALS CH
1.0000 | ORAL_TABLET | Freq: Every day | ORAL | Status: DC
  Administered 2020-03-24 – 2020-03-26 (×3): 1 via ORAL
  Filled 2020-03-24 (×3): qty 1

## 2020-03-24 MED ORDER — THIAMINE HCL 100 MG/ML IJ SOLN
100.0000 mg | Freq: Every day | INTRAMUSCULAR | Status: DC
  Filled 2020-03-24: qty 2

## 2020-03-24 MED ORDER — VITAMIN B-12 1000 MCG PO TABS
1000.0000 ug | ORAL_TABLET | Freq: Every day | ORAL | Status: DC
  Administered 2020-03-25 – 2020-03-26 (×2): 1000 ug via ORAL
  Filled 2020-03-24 (×2): qty 1

## 2020-03-24 MED ORDER — GABAPENTIN 300 MG PO CAPS
600.0000 mg | ORAL_CAPSULE | Freq: Two times a day (BID) | ORAL | Status: DC
  Administered 2020-03-24 – 2020-03-26 (×5): 600 mg via ORAL
  Filled 2020-03-24 (×5): qty 2

## 2020-03-24 MED ORDER — ENSURE ENLIVE PO LIQD
237.0000 mL | Freq: Two times a day (BID) | ORAL | Status: DC
  Administered 2020-03-25 – 2020-03-26 (×3): 237 mL via ORAL

## 2020-03-24 MED ORDER — HYDRALAZINE HCL 20 MG/ML IJ SOLN
10.0000 mg | Freq: Once | INTRAMUSCULAR | Status: AC
  Administered 2020-03-24: 10 mg via INTRAVENOUS
  Filled 2020-03-24: qty 1

## 2020-03-24 MED ORDER — INSULIN ASPART 100 UNIT/ML ~~LOC~~ SOLN
0.0000 [IU] | Freq: Three times a day (TID) | SUBCUTANEOUS | Status: DC
  Administered 2020-03-25 – 2020-03-26 (×5): 2 [IU] via SUBCUTANEOUS

## 2020-03-24 NOTE — Progress Notes (Signed)
Initial Nutrition Assessment  DOCUMENTATION CODES:   Not applicable  INTERVENTION:   Ensure Enlive po BID, each supplement provides 350 kcal and 20 grams of protein  MVI, folic acid and thiamine daily   Pt at high refeed risk; recommend monitor K, Mg and P labs daily as oral intake improves  NUTRITION DIAGNOSIS:   Predicted suboptimal nutrient intake related to social / environmental circumstances(etoh abuse) as evidenced by other (comment)(per chart review).  GOAL:   Patient will meet greater than or equal to 90% of their needs  MONITOR:   PO intake, Supplement acceptance, Labs, Weight trends, Skin, I & O's  REASON FOR ASSESSMENT:   Malnutrition Screening Tool    ASSESSMENT:   59 y.o. male with medical history significant of diet-controlled diabetes mellitus, systolic CHF with ejection fraction of 25 to 30%, A. fib on Eliquis (s/p TEE and cardioversion), CKD stage III, chronic hyponatremia, recurrent falls, hypertension, tobacco abuse, alcohol abuse, cocaine abuse, depression/anxiety, hyperlipidemia presents to emergency department after  fall.   RD working remotely.  Unable to reach pt by phone. RD suspects pt with poor appetite and oral intake at baseline r/t etoh abuse. Pt documented to be eating 75% of meals in hospital. RD will add supplements to help pt meet his estimated needs. Pt is at refeed risk. There is no weight history in chart to determine if any significant weight changes.   Medications reviewed and include: lovenox, folic acid, remeron, MVI, aldactone, thiamine    Labs reviewed: Na 125(L), creat 1.28(H) P 2.6 wnl, Mg 1.5(L)- 4/2   NUTRITION - FOCUSED PHYSICAL EXAM: Unable to perform at this time  Diet Order:   Diet Order            Diet 2 gram sodium Room service appropriate? Yes; Fluid consistency: Thin  Diet effective now             EDUCATION NEEDS:   No education needs have been identified at this time  Skin:  Skin Assessment:  Reviewed RN Assessment(wound R toe, laceration to R eye and knee)  Last BM:  4/2- type 6  Height:   Ht Readings from Last 1 Encounters:  03/23/20 5\' 11"  (1.803 m)    Weight:   Wt Readings from Last 1 Encounters:  03/24/20 81.3 kg    Ideal Body Weight:  78.2 kg  BMI:  Body mass index is 25.01 kg/m.  Estimated Nutritional Needs:   Kcal:  2100-2400kcal/day  Protein:  105-120g/day  Fluid:  >2.4L/day  Koleen Distance MS, RD, LDN Please refer to [facility] for RD and/or RD on-call/weekend/after hours pager

## 2020-03-24 NOTE — Progress Notes (Addendum)
PROGRESS NOTE  Brad [name] [id number] DOB: [birth year] DOA: 03/23/2020 PCP: Patient, No Pcp Per  HPI/Recap of past 24 hours: HPI from Dr Doristine Bosworth Brad [name] is a 59 y.o. male with medical history significant of diet-controlled diabetes mellitus, systolic CHF with ejection fraction of 25 to 30%, A. fib on Eliquis (s/p TEE and cardioversion), CKD stage III, chronic hyponatremia, recurrent falls, hypertension, tobacco/alcohol/cocaine abuse, depression/anxiety, hyperlipidemia presents to ED after he fell on his right side sustaining a small abrasion on right temporal region. Pt states that he has been stressed out lately as his aunt passed away recently and he could not go to funeral.  He drinks 40 ounces of beer now and then. Denies history of depression, suicidal or homicidal thoughts.  He tells me that he wants to quit drinking & asking for medicines to help him stop drinking.  He smokes half pack of cigarettes per day, denies illicit drug use.  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 Reports severe headache however denies blurry vision. Recently admitted with acute on chronic hyponatremia, recurrent falls due to alcohol abuse.  His Eliquis was held due to recurrent falls. In the ED, HR in 50s, elevated ethanol level, CBC shows WBC of 3.4, H&H 11.8/33.8, sodium of 117, AKI, COVID-19 negative. CT head/cervical spine and x-ray of pelvis negative for acute findings.  [facility] consulted for severe hyponatremia and alcohol intoxication.    Today, patient continues to complain of right-sided face pain/headache, noted some generalized tremors, denies any chest pain, worsening shortness of breath, abdominal pain, nausea/vomiting, fever/chills.    Assessment/Plan: Active Problems:   Alcohol abuse with intoxication ([place])   Hyponatremia   Fall   Normocytic anemia   Leukopenia   HTN (hypertension)   HLD (hyperlipidemia)   AKI (acute kidney  injury) (HCC)   CHF (congestive heart failure) (HCC)   Acute on chronic hyponatremia Sodium 117 on admission Likely 2/2 beer potomania in addition to underlying heart failure Improved status post very gentle hydration Daily BMP  Alcohol abuse with intoxication/withdrawal Ethanol level elevated on admission CIWA protocol, seizure precautions Advised to quit  Recurrent falls Likely 2/2 above CT head/cervical spine, x-ray of pelvis all negative for any acute findings PT/OT Fall precautions  Hypomagnesemia Replace as needed  Vitamin B12 deficiency Daily cyanocobalamin  Combined systolic/diastolic CHF Appears euvolemic Strict I's and O's, daily weights Continue Bidil, Aldactone Continue to hold diuretics for today, plan to restart once sodium level remains stable, likely tomorrow  Chronic atrial fibrillation Heart rate controlled Continue amiodarone, digoxin  Hypertension Continue bidil  Diabetes mellitus type 2 A1c 6.8 on 02/2020 Not on any medications at home Continue diet control SSI, Accu-Cheks, hypoglycemic protocol  CKD stage III Creatinine at baseline (1.5-1.7) Daily BMP  Leukopenia Likely due to bone marrow suppression from alcohol abuse Daily CBC  Tobacco/polysubstance abuse Advised to quit        Malnutrition Type:  Nutrition Problem: Predicted suboptimal nutrient intake Etiology: social / environmental circumstances(etoh abuse)   Malnutrition Characteristics:  Signs/Symptoms: other (comment)(per chart review)   Nutrition Interventions:       Estimated body mass index is 25.01 kg/m as calculated from the following:   Height as of this encounter: 5\' 11"  (1.803 m).   Weight as of this encounter: 81.3 kg.     Code Status: Full  Family Communication: Discussed extensively with patient  Disposition Plan: Came from home, likely to discharge to home pending PT/OT  in about 24 to 48  hours.   Consultants:  None  Procedures:  None  Antimicrobials:  None  DVT prophylaxis: Lovenox   Objective: Vitals:   03/24/20 0643 03/24/20 0730 03/24/20 0816 03/24/20 1257  BP: (!) 170/120 (!) 164/85 (!) 152/92 112/72  Pulse:   71 68  Resp: 19 (!) 21 17 20   Temp:   98 F (36.7 C) 97.6 F (36.4 C)  TempSrc:   Oral Oral  SpO2: 92% 96% 99% 96%  Weight:      Height:        Intake/Output Summary (Last 24 hours) at 03/24/2020 1402 Last data filed at 03/24/2020 0819 Gross per 24 hour  Intake 1159.26 ml  Output 1550 ml  Net -390.74 ml   Filed Weights   03/23/20 1350 03/24/20 0550  Weight: 77.1 kg 81.3 kg    Exam:  General: NAD, oriented, bruising noted on right side of temple  Cardiovascular: S1, S2 present  Respiratory: CTAB  Abdomen: Soft, nontender, nondistended, bowel sounds present  Musculoskeletal: No bilateral pedal edema noted  Skin: Normal  Psychiatry: Normal mood   Data Reviewed: CBC: Recent Labs  Lab 03/23/20 1403 03/23/20 1728 03/24/20 0243  WBC 3.4* 2.7* 2.8*  NEUTROABS 2.1  --   --   HGB 11.8* 11.5* 11.9*  HCT 33.8* 32.9* 34.6*  MCV 90.9 90.4 89.6  PLT 208 201 [id number]   Basic Metabolic Panel: Recent Labs  Lab 03/23/20 1403 03/23/20 1728 03/23/20 2058 03/23/20 2257 03/24/20 0243  NA 117* 119* 119* 123* 125*  K 4.6 4.2 5.0 4.1 4.4  CL 82* 84* 85* 87* 87*  CO2 23 22 19* 23 24  GLUCOSE 126* 125* 142* 117* 144*  BUN 13 11 14 11 11   CREATININE 1.43* 1.25*  1.30* 1.28* 1.31* 1.28*  CALCIUM 8.4* 8.1* 8.2* 8.2* 8.9  MG  --  1.5*  --   --   --   PHOS  --  2.6  --   --   --    GFR: Estimated Creatinine Clearance: 65.4 mL/min (A) (by C-G formula based on SCr of 1.28 mg/dL (H)). Liver Function Tests: No results for input(s): AST, ALT, ALKPHOS, BILITOT, PROT, ALBUMIN in the last 168 hours. No results for input(s): LIPASE, AMYLASE in the last 168 hours. No results for input(s): AMMONIA in the last 168 hours. Coagulation  Profile: No results for input(s): INR, PROTIME in the last 168 hours. Cardiac Enzymes: No results for input(s): CKTOTAL, CKMB, CKMBINDEX, TROPONINI in the last 168 hours. BNP (last 3 results) No results for input(s): PROBNP in the last 8760 hours. HbA1C: No results for input(s): HGBA1C in the last 72 hours. CBG: No results for input(s): GLUCAP in the last 168 hours. Lipid Profile: Recent Labs    03/23/20 1728  CHOL 137  HDL 77  LDLCALC 45  TRIG 73  CHOLHDL 1.8   Thyroid Function Tests: Recent Labs    03/23/20 1728  TSH 3.133   Anemia Panel: Recent Labs    03/23/20 1728  VITAMINB12 217   Urine analysis:    Component Value Date/Time   COLORURINE YELLOW 03/23/2020 1728   APPEARANCEUR CLEAR 03/23/2020 1728   LABSPEC 1.003 (L) 03/23/2020 1728   PHURINE 6.0 03/23/2020 1728   GLUCOSEU NEGATIVE 03/23/2020 1728   HGBUR NEGATIVE 03/23/2020 1728   BILIRUBINUR NEGATIVE 03/23/2020 1728   KETONESUR NEGATIVE 03/23/2020 1728   PROTEINUR 100 (A) 03/23/2020 1728   NITRITE NEGATIVE 03/23/2020 1728   LEUKOCYTESUR NEGATIVE 03/23/2020 1728  Sepsis Labs: @LABRCNTIP (procalcitonin:4,lacticidven:4)  ) Recent Results (from the past 240 hour(s))  SARS CORONAVIRUS 2 (TAT 6-24 HRS) Nasopharyngeal Nasopharyngeal Swab     Status: None   Collection Time: 03/23/20  2:39 PM   Specimen: Nasopharyngeal Swab  Result Value Ref Range Status   SARS Coronavirus 2 NEGATIVE NEGATIVE Final    Comment: (NOTE) SARS-CoV-2 target nucleic acids are NOT DETECTED. The SARS-CoV-2 RNA is generally detectable in upper and lower respiratory specimens during the acute phase of infection. Negative results do not preclude SARS-CoV-2 infection, do not rule out co-infections with other pathogens, and should not be used as the sole basis for treatment or other patient management decisions. Negative results must be combined with clinical observations, patient history, and epidemiological information. The  expected result is Negative. Fact Sheet for Patients: SugarRoll.be Fact Sheet for Healthcare Providers: https://www.woods-mathews.com/ This test is not yet approved or cleared by the [place] FDA and  has been authorized for detection and/or diagnosis of SARS-CoV-2 by FDA under an Emergency Use Authorization (EUA). This EUA will remain  in effect (meaning this test can be used) for the duration of the COVID-19 declaration under Section 56 4(b)(1) of the Act, 21 U.S.C. section 360bbb-3(b)(1), unless the authorization is terminated or revoked sooner. Performed at [facility], [place] [address], [place], [place] [address]       Studies: CT Head Wo Contrast  Result Date: 03/23/2020 CLINICAL DATA:  Ground level fall EXAM: CT HEAD WITHOUT CONTRAST CT CERVICAL SPINE WITHOUT CONTRAST TECHNIQUE: Multidetector CT imaging of the head and cervical spine was performed following the standard protocol without intravenous contrast. Multiplanar CT image reconstructions of the cervical spine were also generated. COMPARISON:  CT brain and cervical spine 03/16/2020 FINDINGS: CT HEAD FINDINGS Brain: No acute territorial infarction, hemorrhage, or intracranial mass. Mild atrophy. Patchy hypodensity in the white matter consistent with chronic small vessel ischemic change. The ventricles are nonenlarged Vascular: No hyperdense vessels.  Carotid vascular calcification. Skull: Normal. Negative for fracture or focal lesion. Sinuses/Orbits: Mild mucosal thickening in the maxillary and ethmoid sinuses Other: None CT CERVICAL SPINE FINDINGS Alignment: Mild motion degradation. Sagittal alignment is stable with mild reversal of cervical lordosis. Facet alignment is maintained. There is rotated appearance of C1 on C2 which is probably positional. Skull base and vertebrae: No acute fracture. No primary bone lesion or focal pathologic process. Soft tissues and spinal canal: No  prevertebral fluid or swelling. No visible canal hematoma. Disc levels: Mild to moderate degenerative changes at C4-C5 and C5-C6. Posterior disc osteophyte complex at C4-C5 and C5-C6 results in at least mild canal stenosis at C4-C5 and moderate canal stenosis at C5-C6. Facet degenerative changes at multiple levels with bilateral foraminal stenosis, mild at C4-C5 and moderate at C5-C6. Upper chest: Negative. Other: Carotid vascular calcification. IMPRESSION: 1. No CT evidence for acute intracranial abnormality. Atrophy and chronic small vessel ischemic changes of the white matter. 2. Degenerative changes of the cervical spine. No definitive fracture is seen. Rotated appearance of C1 on C2 is similar as compared with 03/16/2020 and suspected to be secondary to positioning. Electronically Signed   By: Donavan Foil M.D.   On: 03/23/2020 15:40   CT Cervical Spine Wo Contrast  Result Date: 03/23/2020 CLINICAL DATA:  Ground level fall EXAM: CT HEAD WITHOUT CONTRAST CT CERVICAL SPINE WITHOUT CONTRAST TECHNIQUE: Multidetector CT imaging of the head and cervical spine was performed following the standard protocol without intravenous contrast. Multiplanar CT image reconstructions of the cervical spine  were also generated. COMPARISON:  CT brain and cervical spine 03/16/2020 FINDINGS: CT HEAD FINDINGS Brain: No acute territorial infarction, hemorrhage, or intracranial mass. Mild atrophy. Patchy hypodensity in the white matter consistent with chronic small vessel ischemic change. The ventricles are nonenlarged Vascular: No hyperdense vessels.  Carotid vascular calcification. Skull: Normal. Negative for fracture or focal lesion. Sinuses/Orbits: Mild mucosal thickening in the maxillary and ethmoid sinuses Other: None CT CERVICAL SPINE FINDINGS Alignment: Mild motion degradation. Sagittal alignment is stable with mild reversal of cervical lordosis. Facet alignment is maintained. There is rotated appearance of C1 on C2 which is  probably positional. Skull base and vertebrae: No acute fracture. No primary bone lesion or focal pathologic process. Soft tissues and spinal canal: No prevertebral fluid or swelling. No visible canal hematoma. Disc levels: Mild to moderate degenerative changes at C4-C5 and C5-C6. Posterior disc osteophyte complex at C4-C5 and C5-C6 results in at least mild canal stenosis at C4-C5 and moderate canal stenosis at C5-C6. Facet degenerative changes at multiple levels with bilateral foraminal stenosis, mild at C4-C5 and moderate at C5-C6. Upper chest: Negative. Other: Carotid vascular calcification. IMPRESSION: 1. No CT evidence for acute intracranial abnormality. Atrophy and chronic small vessel ischemic changes of the white matter. 2. Degenerative changes of the cervical spine. No definitive fracture is seen. Rotated appearance of C1 on C2 is similar as compared with 03/16/2020 and suspected to be secondary to positioning. Electronically Signed   By: Donavan Foil M.D.   On: 03/23/2020 15:40    Scheduled Meds: . amiodarone  200 mg Oral Daily  . amitriptyline  20 mg Oral QHS  . atorvastatin  20 mg Oral QHS  . digoxin  125 mcg Oral Daily  . DULoxetine  60 mg Oral Daily  . enoxaparin (LOVENOX) injection  40 mg Subcutaneous Q24H  . feeding supplement (ENSURE ENLIVE)  237 mL Oral BID BM  . folic acid  1 mg Oral Daily  . gabapentin  600 mg Oral BID  . isosorbide-hydrALAZINE  1 tablet Oral BID  . LORazepam  0-4 mg Intravenous Q4H   Followed by  . [START ON 03/26/2020] LORazepam  0-4 mg Intravenous Q8H  . mirtazapine  15 mg Oral QHS  . multivitamin with minerals  1 tablet Oral Daily  . spironolactone  25 mg Oral Daily  . thiamine  100 mg Oral Daily   Or  . thiamine  100 mg Intravenous Daily    Continuous Infusions:   LOS: 1 day     Alma Friendly, MD [facility]  If 7PM-7AM, please contact night-coverage [email or web address] 03/24/2020, 2:02 PM

## 2020-03-24 NOTE — Evaluation (Signed)
Physical Therapy Evaluation Patient Details Name: Brad [name] MRN: [id number] DOB: [birth year] Today's Date: 03/24/2020   History of Present Illness  59 y.o. male with medical history significant of diet-controlled diabetes mellitus, systolic CHF with ejection fraction of 25 to 30%, A. fib on Eliquis (s/p TEE and cardioversion), CKD stage III, chronic hyponatremia, recurrent falls, hypertension, tobacco abuse, alcohol abuse, cocaine abuse, depression/anxiety, hyperlipidemia presents to emergency department after  fall. Recently admitted with acute on chronic hyponatremia.   Clinical Impression   Pt admitted with above diagnosis. PTA was living home independently, states has a roommate, denies use of walker but does own one. Pt currently with functional limitations due to the deficits listed below (see PT Problem List). Was able to complete all transfers with SBA-mod I and ambulated in hall approx 13ft with RW and min guard assist. Initially ambulated in room with no AD and was noted to have crouched gait but with walker knees slightly more extended. Pt denies pain and instability in B knees. Pt will benefit from skilled PT to increase their independence and safety with mobility to allow discharge to the venue listed below.       Follow Up Recommendations Home health PT;Supervision for mobility/OOB    Equipment Recommendations  None recommended by PT    Recommendations for Other Services       Precautions / Restrictions Precautions Precautions: Fall Restrictions Weight Bearing Restrictions: No      Mobility  Bed Mobility Overal bed mobility: Modified Independent                Transfers Overall transfer level: Needs assistance Equipment used: Rolling walker (2 wheeled) Transfers: Sit to/from [employer] Transfers Sit to Stand: Supervision Stand pivot transfers: Supervision          Ambulation/Gait Ambulation/Gait assistance: Min guard;Supervision Gait  Distance (Feet): 180 Feet Assistive device: Rolling walker (2 wheeled) Gait Pattern/deviations: Step-through pattern Gait velocity: dec   General Gait Details: noted flexed knees without walker, better with walker but still slightly flexed pt denies pain or weakness/instability in knees   Stairs            Wheelchair Mobility    Modified [name] (Stroke Patients Only)       Balance Overall balance assessment: Mild deficits observed, not formally tested                                           Pertinent Vitals/Pain Pain Assessment: No/denies pain    Home Living Family/patient expects to be discharged to:: Private residence Living Arrangements: Other (Comment)(roomate) Available Help at Discharge: Friend(s) Type of Home: Apartment       Home Layout: One level Home Equipment: Environmental consultant - 2 wheels;Cane - single point      Prior Function Level of Independence: Independent               Hand Dominance   Dominant Hand: Right    Extremity/Trunk Assessment   Upper Extremity Assessment Upper Extremity Assessment: Overall WFL for tasks assessed    Lower Extremity Assessment Lower Extremity Assessment: Generalized weakness    Cervical / Trunk Assessment Cervical / Trunk Assessment: Normal  Communication   Communication: No difficulties  Cognition Arousal/Alertness: Awake/alert Behavior During Therapy: WFL for tasks assessed/performed Overall Cognitive Status: Within Functional Limits for tasks assessed  General Comments      Exercises     Assessment/Plan    PT Assessment Patient needs continued PT services  PT Problem List Decreased strength;Decreased activity tolerance;Decreased balance;Decreased mobility;Decreased coordination;Decreased knowledge of use of DME;Decreased safety awareness       PT Treatment Interventions DME instruction;Gait training;Functional mobility  training;Therapeutic activities;Therapeutic exercise;Balance training;Neuromuscular re-education;Patient/family education    PT Goals (Current goals can be found in the Care Plan section)  Acute Rehab PT Goals Patient Stated Goal: did not voice PT Goal Formulation: With patient Time For Goal Achievement: 04/07/20 Potential to Achieve Goals: Fair    Frequency Min 3X/week   Barriers to discharge        Co-evaluation               AM-PAC PT "6 Clicks" Mobility  Outcome Measure Help needed turning from your back to your side while in a flat bed without using bedrails?: None Help needed moving from lying on your back to sitting on the side of a flat bed without using bedrails?: None Help needed moving to and from a bed to a chair (including a wheelchair)?: A Little Help needed standing up from a chair using your arms (e.g., wheelchair or bedside chair)?: A Little Help needed to walk in hospital room?: A Little Help needed climbing 3-5 steps with a railing? : A Lot 6 Click Score: 19    End of Session Equipment Utilized During Treatment: Gait belt Activity Tolerance: Patient tolerated treatment well;Patient limited by fatigue Patient left: in bed;with call bell/phone within reach   PT Visit Diagnosis: Other abnormalities of gait and mobility (R26.89);Muscle weakness (generalized) (M62.81)    Time: [id number] PT Time Calculation (min) (ACUTE ONLY): 19 min   Charges:   PT Evaluation $PT Eval Moderate Complexity: [place], PT   Delford Field 03/24/2020, 2:50 PM

## 2020-03-24 NOTE — Evaluation (Signed)
Occupational Therapy Evaluation Patient Details Name: Brad [name] MRN: [id number] DOB: 1961-03-14 Today's Date: 03/24/2020    History of Present Illness Pt is a 59 yr old male who had a fall when getting his mail. Pt found to have heart rate in 50s, elevated ethanol level, CBC shows WBC of 3.4, H&H 11.8/33.8, sodium of 117, AKI PMH: diabetes mellitus, systolic CHF with ejection fraction of 25 to 30%, A. fib on Eliquis (s/p TEE and cardioversion), CKD stage III, chronic hyponatremia, recurrent falls, hypertension, tobacco abuse, alcohol abuse, cocaine abuse, depression/anxiety, hyperlipidemia    Clinical Impression   Pt admitted with a fall. Pt currently with functional limitations due to the deficits listed below (see OT Problem List). Pt was able to complete bed mobility with modified independence, sit to stand with min guard and cues for hand placement, mobility with RW with min guard, and standing ADLS with min guard and occasional UE support but reported fatigued. Pt lives with a roommate at home and was independent PLOF for ADLS with use of cane.  Pt will benefit from skilled OT to increase their safety and independence with ADL and functional mobility for ADL to facilitate discharge to venue listed below. Pt will be followed by Occupational Therapy services.       Follow Up Recommendations  Home health OT;Supervision - Intermittent    Equipment Recommendations  Tub/shower seat    Recommendations for Other Services       Precautions / Restrictions Precautions Precautions: Fall Restrictions Weight Bearing Restrictions: No      Mobility Bed Mobility Overal bed mobility: Modified Independent             General bed mobility comments: use of bed rail  Transfers Overall transfer level: Needs assistance Equipment used: Rolling walker (2 wheeled) Transfers: Sit to/from Stand Sit to Stand: Supervision              Balance Overall balance assessment: Mild deficits  observed, not formally tested                                         ADL either performed or assessed with clinical judgement   ADL Overall ADL's : Needs assistance/impaired Eating/Feeding: Modified independent;Sitting   Grooming: Wash/dry hands;Wash/dry face;Min guard;Standing   Upper Body Bathing: Min guard;Sitting   Lower Body Bathing: Minimal assistance;Sit to/from stand   Upper Body Dressing : Sitting;Set up   Lower Body Dressing: Min guard;Sit to/from stand   Toilet Transfer: Tour manager Toilet;RW   Toileting- Water quality scientist and Hygiene: Min guard;Sit to/from stand;Cueing for safety;Cueing for sequencing   Tub/ Shower Transfer: Min guard;Cueing for safety;Cueing for sequencing;Grab bars   Functional mobility during ADLs: Rolling walker;Min guard       Vision   Vision Assessment?: No apparent visual deficits     Agricultural engineer Tested?: No   Praxis Praxis Praxis tested?: Not tested    Pertinent Vitals/Pain Pain Assessment: 0-10 Pain Score: 0-No pain     Hand Dominance Right   Extremity/Trunk Assessment Upper Extremity Assessment Upper Extremity Assessment: Overall WFL for tasks assessed   Lower Extremity Assessment Lower Extremity Assessment: Defer to PT evaluation   Cervical / Trunk Assessment Cervical / Trunk Assessment: Normal   Communication Communication Communication: No difficulties   Cognition Arousal/Alertness: Awake/alert Behavior During Therapy: WFL for tasks assessed/performed Overall Cognitive Status: Within Functional Limits for tasks assessed  General Comments       Exercises     Shoulder Instructions      Home Living Family/patient expects to be discharged to:: Private residence Living Arrangements: Other (Comment)(roomate) Available Help at Discharge: Friend(s) Type of Home: Apartment Home Access: (no stairs )     Home  Layout: One level     Bathroom Shower/Tub: Teacher, early years/pre: Standard Bathroom Accessibility: Yes How Accessible: Accessible via walker Home Equipment: [place] - 2 wheels;Cane - single point          Prior Functioning/Environment Level of Independence: Independent                 OT Problem List: Decreased activity tolerance;Impaired balance (sitting and/or standing);Decreased knowledge of use of DME or AE;Decreased safety awareness      OT Treatment/Interventions: Self-care/ADL training;Therapeutic exercise;DME and/or AE instruction;Patient/family education;Balance training    OT Goals(Current goals can be found in the care plan section) Acute Rehab OT Goals Patient Stated Goal: to get some rest OT Goal Formulation: With patient Time For Goal Achievement: 04/07/20 Potential to Achieve Goals: Good ADL Goals Pt Will Perform Upper Body Dressing: with modified independence;sitting;standing Pt Will Transfer to Toilet: with modified independence;ambulating Pt Will Perform Tub/Shower Transfer: Shower transfer;with modified independence;rolling walker;grab bars  OT Frequency: Min 2X/week   Barriers to D/C:            Co-evaluation              AM-PAC OT "6 Clicks" Daily Activity     Outcome Measure Help from another person eating meals?: None Help from another person taking care of personal grooming?: None Help from another person toileting, which includes using toliet, bedpan, or urinal?: A Little Help from another person bathing (including washing, rinsing, drying)?: A Little Help from another person to put on and taking off regular upper body clothing?: None Help from another person to put on and taking off regular lower body clothing?: A Little 6 Click Score: 21   End of Session Equipment Utilized During Treatment: Gait belt;Rolling walker  Activity Tolerance: Patient tolerated treatment well Patient left: in bed;with bed alarm set;with  call bell/phone within reach  OT Visit Diagnosis: Unsteadiness on feet (R26.81);Other abnormalities of gait and mobility (R26.89)                Time: [id number] OT Time Calculation (min): 22 min Charges:  OT General Charges $OT Visit: 1 Visit OT Evaluation $OT Eval Low Complexity: [place] OTR/L  Acute Rehab Services  [phone] office number [phone] pager number   Joeseph Amor 03/24/2020, 12:36 PM

## 2020-03-25 LAB — CBC WITH DIFFERENTIAL/PLATELET
Abs Immature Granulocytes: 0.01 10*3/uL (ref 0.00–0.07)
Basophils Absolute: 0 10*3/uL (ref 0.0–0.1)
Basophils Relative: 1 %
Eosinophils Absolute: 0.1 10*3/uL (ref 0.0–0.5)
Eosinophils Relative: 2 %
HCT: 32.8 % — ABNORMAL LOW (ref 39.0–52.0)
Hemoglobin: 11.1 g/dL — ABNORMAL LOW (ref 13.0–17.0)
Immature Granulocytes: 0 %
Lymphocytes Relative: 9 %
Lymphs Abs: 0.3 10*3/uL — ABNORMAL LOW (ref 0.7–4.0)
MCH: 31.4 pg (ref 26.0–34.0)
MCHC: 33.8 g/dL (ref 30.0–36.0)
MCV: 92.9 fL (ref 80.0–100.0)
Monocytes Absolute: 0.8 10*3/uL (ref 0.1–1.0)
Monocytes Relative: 21 %
Neutro Abs: 2.6 10*3/uL (ref 1.7–7.7)
Neutrophils Relative %: 67 %
Platelets: 154 10*3/uL (ref 150–400)
RBC: 3.53 MIL/uL — ABNORMAL LOW (ref 4.22–5.81)
RDW: 14 % (ref 11.5–15.5)
WBC: 3.8 10*3/uL — ABNORMAL LOW (ref 4.0–10.5)
nRBC: 0 % (ref 0.0–0.2)

## 2020-03-25 LAB — BASIC METABOLIC PANEL
Anion gap: 10 (ref 5–15)
BUN: 16 mg/dL (ref 6–20)
CO2: 26 mmol/L (ref 22–32)
Calcium: 8.9 mg/dL (ref 8.9–10.3)
Chloride: 90 mmol/L — ABNORMAL LOW (ref 98–111)
Creatinine, Ser: 1.37 mg/dL — ABNORMAL HIGH (ref 0.61–1.24)
GFR calc Af Amer: >60 mL/min (ref >60)
GFR calc non Af Amer: 56 mL/min — ABNORMAL LOW (ref >60)
Glucose, Bld: 161 mg/dL — ABNORMAL HIGH (ref 70–99)
Potassium: 4.3 mmol/L (ref 3.5–5.1)
Sodium: 126 mmol/L — ABNORMAL LOW (ref 135–145)

## 2020-03-25 LAB — GLUCOSE, CAPILLARY
Glucose-Capillary: 184 mg/dL — ABNORMAL HIGH (ref 70–99)
Glucose-Capillary: 167 mg/dL — ABNORMAL HIGH (ref 70–99)
Glucose-Capillary: 179 mg/dL — ABNORMAL HIGH (ref 70–99)

## 2020-03-25 LAB — FOLATE RBC
Folate, Hemolysate: 347 ng/mL
Folate, RBC: 1039 ng/mL (ref >498)
Hematocrit: 33.4 % — ABNORMAL LOW (ref 37.5–51.0)

## 2020-03-25 LAB — MAGNESIUM: Magnesium: 1.9 mg/dL (ref 1.7–2.4)

## 2020-03-25 LAB — PHOSPHORUS: Phosphorus: 2.9 mg/dL (ref 2.5–4.6)

## 2020-03-25 IMAGING — DX PORTABLE CHEST - 1 VIEW
1 series · 1 of 1 positions shown · non-contrast
Comparison: 03/10/2019

CLINICAL DATA: Shortness of breath, chest pain

EXAM:
PORTABLE CHEST 1 VIEW

[chest]
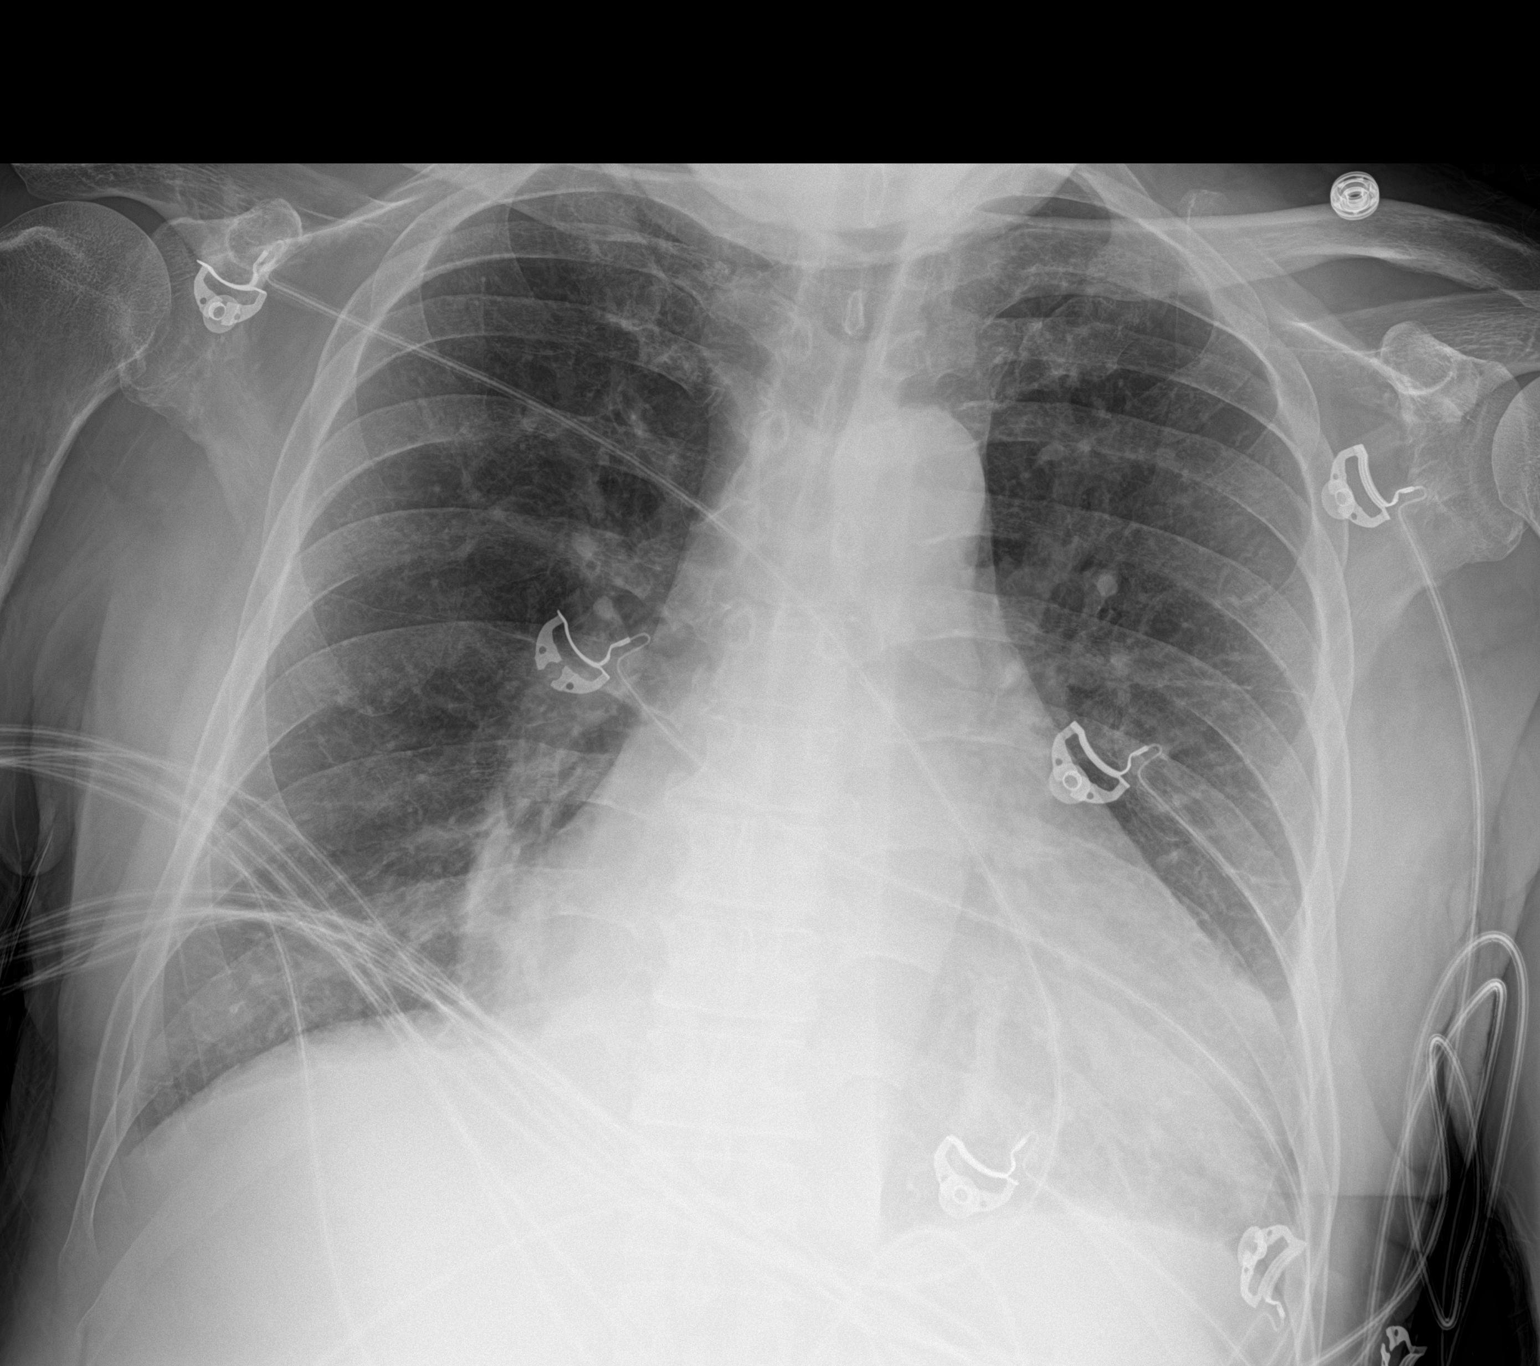

[1 of 1 positions shown; findings below may reference images not displayed]

FINDINGS: Cardiomegaly. Right medial basilar atelectasis or infiltrate.
Minimal left base atelectasis. No visible effusions or edema. No
acute bony abnormality.
IMPRESSION: Medial right basilar opacity could reflect atelectasis or
infiltrate.

Cardiomegaly.

## 2020-03-25 MED ORDER — TORSEMIDE 20 MG PO TABS
20.0000 mg | ORAL_TABLET | Freq: Every day | ORAL | Status: DC
  Administered 2020-03-25: 20 mg via ORAL
  Filled 2020-03-25: qty 1

## 2020-03-25 NOTE — Progress Notes (Signed)
PROGRESS NOTE  Brad [name] [id number] DOB: 1961/04/21 DOA: 03/23/2020 PCP: Patient, No Pcp Per  HPI/Recap of past 24 hours: HPI from Dr Brad [name] is a 59 y.o. male with medical history significant of diet-controlled diabetes mellitus, systolic CHF with ejection fraction of 25 to 30%, A. fib on Eliquis (s/p TEE and cardioversion), CKD stage III, chronic hyponatremia, recurrent falls, hypertension, tobacco/alcohol/cocaine abuse, depression/anxiety, hyperlipidemia presents to ED after he fell on his right side sustaining a small abrasion on right temporal region. Pt states that he has been stressed out lately as his aunt passed away recently and he could not go to funeral.  He drinks 40 ounces of beer now and then. Denies history of depression, suicidal or homicidal thoughts.  He tells me that he wants to quit drinking & asking for medicines to help him stop drinking.  He smokes half pack of cigarettes per day, denies illicit drug use.  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 Reports severe headache however denies blurry vision. Recently admitted with acute on chronic hyponatremia, recurrent falls due to alcohol abuse.  His Eliquis was held due to recurrent falls. In the ED, HR in 50s, elevated ethanol level, CBC shows WBC of 3.4, H&H 11.8/33.8, sodium of 117, AKI, COVID-19 negative. CT head/cervical spine and x-ray of pelvis negative for acute findings.  [facility] hospital[facility] consulted for severe hyponatremia and alcohol intoxication.    Today, patient denies any new complaints.  Continue to request for pain meds as well as Ativan.  Noted to have some tremors, likely withdrawing    Assessment/Plan: Active Problems:   Alcohol abuse with intoxication ([place])   Hyponatremia   Fall   Normocytic anemia   Leukopenia   HTN (hypertension)   HLD (hyperlipidemia)   AKI (acute kidney injury) ([place])   CHF (congestive heart failure) ([facility])   Acute on  chronic hyponatremia Sodium 117 on admission Likely 2/2 beer potomania in addition to underlying heart failure Improved status post very gentle hydration Daily BMP  Alcohol abuse with intoxication/withdrawal Ethanol level elevated on admission CIWA protocol, seizure precautions Advised to quit  Recurrent falls Likely 2/2 above, in addition to 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 CT head/cervical spine, x-ray of pelvis all negative for any acute findings PT/OT Fall precautions  Hypomagnesemia Replace as needed  Vitamin B12 deficiency Daily cyanocobalamin  Combined systolic/diastolic CHF Appears euvolemic Strict I's and O's, daily weights Continue Bidil, Aldactone Restart home torsemide as the much lower dose, 20 mg daily and titrate up pending sodium and renal function  Chronic atrial fibrillation Heart rate controlled Continue amiodarone, digoxin  Hypertension Continue bidil  Diabetes mellitus type 2 A1c 6.8 on 02/2020 Not on any medications at home Continue diet control SSI, Accu-Cheks, hypoglycemic protocol  CKD stage III Creatinine at baseline (1.5-1.7) Daily BMP  Leukopenia Likely due to bone marrow suppression from alcohol abuse Daily CBC  Tobacco/polysubstance abuse Advised to quit        Malnutrition Type:  Nutrition Problem: Predicted suboptimal nutrient intake Etiology: social / environmental circumstances(etoh abuse)   Malnutrition Characteristics:  Signs/Symptoms: other (comment)(per chart review)   Nutrition Interventions:       Estimated body mass index is 25.24 kg/m as calculated from the following:   Height as of this encounter: 5\' 11"  (1.803 m).   Weight as of this encounter: 82.1 kg.     Code Status: Full  Family Communication: Discussed extensively with patient  Disposition Plan: Came  from home, likely to discharge to home pending PT/OT in about 24 to 48  hours.   Consultants:  None  Procedures:  None  Antimicrobials:  None  DVT prophylaxis: Lovenox   Objective: Vitals:   03/24/20 2342 03/25/20 0000 03/25/20 0405 03/25/20 1127  BP: (!) 131/91 (!) 137/97 (!) 174/100 (!) 139/100  Pulse: 63 62 64 (!) 126  Resp: 20 20 20 15   Temp: 97.9 F (36.6 C)  97.9 F (36.6 C) 98 F (36.7 C)  TempSrc: Oral  Oral Oral  SpO2: 97% 98%  98%  Weight:   82.1 kg   Height:        Intake/Output Summary (Last 24 hours) at 03/25/2020 1556 Last data filed at 03/25/2020 0840 Gross per 24 hour  Intake 240 ml  Output --  Net 240 ml   Filed Weights   03/23/20 1350 03/24/20 0550 03/25/20 0405  Weight: 77.1 kg 81.3 kg 82.1 kg    Exam:  General: NAD, oriented, bruising noted on right side of temple  Cardiovascular: S1, S2 present  Respiratory: CTAB  Abdomen: Soft, nontender, nondistended, bowel sounds present  Musculoskeletal: 1+ bilateral pedal edema noted  Skin: Normal  Psychiatry: Normal mood   Data Reviewed: CBC: Recent Labs  Lab 03/23/20 1403 03/23/20 1728 03/24/20 0243 03/25/20 0344  WBC 3.4* 2.7* 2.8* 3.8*  NEUTROABS 2.1  --   --  2.6  HGB 11.8* 11.5* 11.9* 11.1*  HCT 33.8* 32.9*  33.4* 34.6* 32.8*  MCV 90.9 90.4 89.6 92.9  PLT 208 201 201 [id number]   Basic Metabolic Panel: Recent Labs  Lab 03/23/20 1728 03/23/20 2058 03/23/20 2257 03/24/20 0243 03/25/20 0344  NA 119* 119* 123* 125* 126*  K 4.2 5.0 4.1 4.4 4.3  CL 84* 85* 87* 87* 90*  CO2 22 19* 23 24 26   GLUCOSE 125* 142* 117* 144* 161*  BUN 11 14 11 11 16   CREATININE 1.25*  1.30* 1.28* 1.31* 1.28* 1.37*  CALCIUM 8.1* 8.2* 8.2* 8.9 8.9  MG 1.5*  --   --   --  1.9  PHOS 2.6  --   --   --  2.9   GFR: Estimated Creatinine Clearance: 61.1 mL/min (A) (by C-G formula based on SCr of 1.37 mg/dL (H)). Liver Function Tests: No results for input(s): AST, ALT, ALKPHOS, BILITOT, PROT, ALBUMIN in the last 168 hours. No results for input(s): LIPASE, AMYLASE in the  last 168 hours. No results for input(s): AMMONIA in the last 168 hours. Coagulation Profile: No results for input(s): INR, PROTIME in the last 168 hours. Cardiac Enzymes: No results for input(s): CKTOTAL, CKMB, CKMBINDEX, TROPONINI in the last 168 hours. BNP (last 3 results) No results for input(s): PROBNP in the last 8760 hours. HbA1C: No results for input(s): HGBA1C in the last 72 hours. CBG: Recent Labs  Lab 03/25/20 0841 03/25/20 1130  GLUCAP 184* 167*   Lipid Profile: Recent Labs    03/23/20 1728  CHOL 137  HDL 77  LDLCALC 45  TRIG 73  CHOLHDL 1.8   Thyroid Function Tests: Recent Labs    03/23/20 1728  TSH 3.133   Anemia Panel: Recent Labs    03/23/20 1728  VITAMINB12 217   Urine analysis:    Component Value Date/Time   COLORURINE YELLOW 03/23/2020 1728   APPEARANCEUR CLEAR 03/23/2020 1728   LABSPEC 1.003 (L) 03/23/2020 1728   PHURINE 6.0 03/23/2020 1728   GLUCOSEU NEGATIVE 03/23/2020 1728   HGBUR NEGATIVE 03/23/2020 1728   BILIRUBINUR NEGATIVE  03/23/2020 1728   KETONESUR NEGATIVE 03/23/2020 1728   PROTEINUR 100 (A) 03/23/2020 1728   NITRITE NEGATIVE 03/23/2020 1728   LEUKOCYTESUR NEGATIVE 03/23/2020 1728   Sepsis Labs: @LABRCNTIP (procalcitonin:4,lacticidven:4)  ) Recent Results (from the past 240 hour(s))  SARS CORONAVIRUS 2 (TAT 6-24 HRS) Nasopharyngeal Nasopharyngeal Swab     Status: None   Collection Time: 03/23/20  2:39 PM   Specimen: Nasopharyngeal Swab  Result Value Ref Range Status   SARS Coronavirus 2 NEGATIVE NEGATIVE Final    Comment: (NOTE) SARS-CoV-2 target nucleic acids are NOT DETECTED. The SARS-CoV-2 RNA is generally detectable in upper and lower respiratory specimens during the acute phase of infection. Negative results do not preclude SARS-CoV-2 infection, do not rule out co-infections with other pathogens, and should not be used as the sole basis for treatment or other patient management decisions. Negative results must be  combined with clinical observations, patient history, and epidemiological information. The expected result is Negative. Fact Sheet for Patients: SugarRoll.be Fact Sheet for Healthcare Providers: https://www.woods-mathews.com/ This test is not yet approved or cleared by the [place] FDA and  has been authorized for detection and/or diagnosis of SARS-CoV-2 by FDA under an Emergency Use Authorization (EUA). This EUA will remain  in effect (meaning this test can be used) for the duration of the COVID-19 declaration under Section 56 4(b)(1) of the Act, 21 U.S.C. section 360bbb-3(b)(1), unless the authorization is terminated or revoked sooner. Performed at [facility], [place] [address] [place], [place] [address]       Studies: No results found.  Scheduled Meds: . amiodarone  200 mg Oral Daily  . amitriptyline  20 mg Oral QHS  . atorvastatin  20 mg Oral QHS  . digoxin  125 mcg Oral Daily  . DULoxetine  60 mg Oral Daily  . enoxaparin (LOVENOX) injection  40 mg Subcutaneous Q24H  . feeding supplement (ENSURE ENLIVE)  237 mL Oral BID BM  . folic acid  1 mg Oral Daily  . gabapentin  600 mg Oral BID  . insulin aspart  0-9 Units Subcutaneous TID WC  . isosorbide-hydrALAZINE  1 tablet Oral BID  . LORazepam  0-4 mg Intravenous Q4H   Followed by  . [START ON 03/26/2020] LORazepam  0-4 mg Intravenous Q8H  . mirtazapine  15 mg Oral QHS  . multivitamin with minerals  1 tablet Oral Daily  . spironolactone  25 mg Oral Daily  . thiamine  100 mg Oral Daily   Or  . thiamine  100 mg Intravenous Daily  . torsemide  20 mg Oral Daily  . vitamin B-12  1,000 mcg Oral Daily    Continuous Infusions:   LOS: 2 days     Alma Friendly, MD [facility]  If 7PM-7AM, please contact night-coverage [email or web address] 03/25/2020, 3:56 PM

## 2020-03-26 ENCOUNTER — Encounter (HOSPITAL_COMMUNITY): Payer: Medicaid Other

## 2020-03-26 DIAGNOSIS — I5042 Chronic combined systolic (congestive) and diastolic (congestive) heart failure: Secondary | ICD-10-CM

## 2020-03-26 LAB — CBC WITH DIFFERENTIAL/PLATELET
Abs Immature Granulocytes: 0.01 10*3/uL (ref 0.00–0.07)
Basophils Absolute: 0 10*3/uL (ref 0.0–0.1)
Basophils Relative: 1 %
Eosinophils Absolute: 0.1 10*3/uL (ref 0.0–0.5)
Eosinophils Relative: 2 %
HCT: 33.7 % — ABNORMAL LOW (ref 39.0–52.0)
Hemoglobin: 11.4 g/dL — ABNORMAL LOW (ref 13.0–17.0)
Immature Granulocytes: 0 %
Lymphocytes Relative: 8 %
Lymphs Abs: 0.3 10*3/uL — ABNORMAL LOW (ref 0.7–4.0)
MCH: 31.7 pg (ref 26.0–34.0)
MCHC: 33.8 g/dL (ref 30.0–36.0)
MCV: 93.6 fL (ref 80.0–100.0)
Monocytes Absolute: 0.6 10*3/uL (ref 0.1–1.0)
Monocytes Relative: 14 %
Neutro Abs: 3.1 10*3/uL (ref 1.7–7.7)
Neutrophils Relative %: 75 %
Platelets: 158 10*3/uL (ref 150–400)
RBC: 3.6 MIL/uL — ABNORMAL LOW (ref 4.22–5.81)
RDW: 14.2 % (ref 11.5–15.5)
WBC: 4.1 10*3/uL (ref 4.0–10.5)
nRBC: 0 % (ref 0.0–0.2)

## 2020-03-26 LAB — BASIC METABOLIC PANEL
Anion gap: 11 (ref 5–15)
BUN: 18 mg/dL (ref 6–20)
CO2: 23 mmol/L (ref 22–32)
Calcium: 8.7 mg/dL — ABNORMAL LOW (ref 8.9–10.3)
Chloride: 90 mmol/L — ABNORMAL LOW (ref 98–111)
Creatinine, Ser: 1.61 mg/dL — ABNORMAL HIGH (ref 0.61–1.24)
GFR calc Af Amer: 53 mL/min — ABNORMAL LOW (ref >60)
GFR calc non Af Amer: 46 mL/min — ABNORMAL LOW (ref >60)
Glucose, Bld: 143 mg/dL — ABNORMAL HIGH (ref 70–99)
Potassium: 4.4 mmol/L (ref 3.5–5.1)
Sodium: 124 mmol/L — ABNORMAL LOW (ref 135–145)

## 2020-03-26 LAB — GLUCOSE, CAPILLARY
Glucose-Capillary: 230 mg/dL — ABNORMAL HIGH (ref 70–99)
Glucose-Capillary: 160 mg/dL — ABNORMAL HIGH (ref 70–99)
Glucose-Capillary: 163 mg/dL — ABNORMAL HIGH (ref 70–99)

## 2020-03-26 LAB — PHOSPHORUS: Phosphorus: 3.3 mg/dL (ref 2.5–4.6)

## 2020-03-26 LAB — MAGNESIUM: Magnesium: 1.7 mg/dL (ref 1.7–2.4)

## 2020-03-26 MED ORDER — TRAMADOL HCL 50 MG PO TABS: 50.0000 mg | ORAL_TABLET | Freq: Four times a day (QID) | ORAL | 0 refills | Status: AC | PRN

## 2020-03-26 MED ORDER — CYANOCOBALAMIN 1000 MCG PO TABS: 1000.0000 ug | ORAL_TABLET | Freq: Every day | ORAL | 0 refills | Status: AC

## 2020-03-26 MED ORDER — FOLIC ACID 1 MG PO TABS: 1.0000 mg | ORAL_TABLET | Freq: Every day | ORAL | 0 refills | Status: AC

## 2020-03-26 NOTE — Progress Notes (Signed)
D/C tele and IV. Went over AVS with pt. Pt verbalized understanding. Sent pt home with pt's belongings and AVS.   Lavenia Atlas, RN

## 2020-03-26 NOTE — Discharge Summary (Addendum)
Discharge Summary  Brad [name] [id number] DOB: 09-08-1961  PCP: Patient, No Pcp Per  Admit date: 03/23/2020 Discharge date: 03/26/2020  Time spent: 40 mins  Recommendations for Outpatient Follow-up:  PCP in 1 week Cardiology in 1 week  Discharge Diagnoses:  Active Hospital Problems   Diagnosis Date Noted   Alcohol abuse with intoxication ([place]) 03/23/2020   Hyponatremia 03/23/2020   Fall 03/23/2020   Normocytic anemia 03/23/2020   Leukopenia 03/23/2020   HTN (hypertension) 03/23/2020   HLD (hyperlipidemia) 03/23/2020   AKI (acute kidney injury) ([place]) 03/23/2020   CHF (congestive heart failure) ([place]) 03/23/2020    Resolved Hospital Problems  No resolved problems to display.    Discharge Condition: Stable  Diet recommendation: Heart healthy/moderate carb  Vitals:   03/26/20 0413 03/26/20 0819  BP: (!) 166/97 (!) 157/98  Pulse: 65 61  Resp: 19 17  Temp: 97.6 F (36.4 C) 97.6 F (36.4 C)  SpO2: 96% 97%    History of present illness:  Brad [name] is a 59 y.o. male with medical history significant of diet-controlled diabetes mellitus, systolic CHF with ejection fraction of 25 to 30%, A. fib on Eliquis (s/p TEE and cardioversion), CKD stage III, chronic hyponatremia, recurrent falls, hypertension, tobacco/alcohol/cocaine abuse, depression/anxiety, hyperlipidemia presents to ED after he fell on his right side sustaining a small abrasion on right temporal region. Pt states that he has been stressed out lately as his aunt passed away recently and he could not go to funeral.  He drinks 40 ounces of beer now and then. Denies history of depression, suicidal or homicidal thoughts.  He tells me that he wants to quit drinking & asking for medicines to help him stop drinking.  He smokes half pack of cigarettes per day, denies illicit drug use.  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 Reports severe headache however denies blurry vision.  Recently admitted with acute on chronic hyponatremia, recurrent falls due to alcohol abuse.  His Eliquis was held due to recurrent falls. In the ED, HR in 50s, elevated ethanol level, CBC shows WBC of 3.4, H&H 11.8/33.8, sodium of 117, AKI, COVID-19 negative. CT head/cervical spine and x-ray of pelvis negative for acute findings.  [facility] hospitalist consulted for severe hyponatremia and alcohol intoxication.    Today, patient denies any new complaints, very eager to be discharged.  Discussed extensively with patient about tobacco/alcohol/cocaine abuse, advised to quit.  Patient verbalized understanding, in agreement.  Advised to see PCP in 1 week with repeat labs.    Hospital Course:  Active Problems:   Alcohol abuse with intoxication ([place])   Hyponatremia   Fall   Normocytic anemia   Leukopenia   HTN (hypertension)   HLD (hyperlipidemia)   AKI (acute kidney injury) ([facility])   CHF (congestive heart failure) ([facility])   Acute on chronic hyponatremia Improved Sodium 117 on admission Likely 2/2 beer potomania in addition to underlying heart failure Improved status post very gentle hydration Follow-up with PCP   Alcohol abuse with intoxication/withdrawal Ethanol level elevated on admission-->230 Advised to quit as mentioned above   Recurrent falls Likely 2/2 above, in addition to 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 CT head/cervical spine, x-ray of pelvis all negative for any acute findings PT/OT- Home health PT/OT recommended   Hypomagnesemia Replaced as needed   Vitamin B12 deficiency Daily cyanocobalamin   Combined systolic/diastolic CHF Appears euvolemic Strict I's and O's, daily weights Continue Bidil, Aldactone, restart home torsemide, follow-up with PCP/cardiology with repeat  labs and for possible medication adjustments pending BMP   Chronic atrial fibrillation Heart rate controlled Continue amiodarone, digoxin   Hypertension Continue bidil   Diabetes mellitus type 2 A1c  6.8 on 02/2020 Not on any medications at home Continue diet control   CKD stage III Creatinine at baseline (1.5-1.7) Follow-up with PCP   Leukopenia Likely due to bone marrow suppression from alcohol abuse Improved   Tobacco/polysubstance abuse Advised to quit alcohol, tobacco, and cocaine           Malnutrition Type:  Nutrition Problem: Predicted suboptimal nutrient intake Etiology: social / environmental circumstances(etoh abuse)   Malnutrition Characteristics:  Signs/Symptoms: other (comment)(per chart review)   Nutrition Interventions:      Estimated body mass index is 25.75 kg/m as calculated from the following:   Height as of this encounter: 5\' 11"  (1.803 m).   Weight as of this encounter: 83.7 kg.    Procedures: None  Consultations: None  Discharge Exam: BP (!) 157/98 (BP Location: Right Arm)   Pulse 61   Temp 97.6 F (36.4 C) (Oral)   Resp 17   Ht 5\' 11"  (1.803 m)   Wt 83.7 kg   SpO2 97%   BMI 25.75 kg/m   General: NAD, bruising around right eye Cardiovascular: S1, S2 present Respiratory: Diminished breath sounds bilaterally  Discharge Instructions You were cared for by a hospitalist during your hospital stay. If you have any questions about your discharge medications or the care you received while you were in the hospital after you are discharged, you can call the unit and asked to speak with the hospitalist on call if the hospitalist that took care of you is not available. Once you are discharged, your primary care physician will handle any further medical issues. Please note that NO REFILLS for any discharge medications will be authorized once you are discharged, as it is imperative that you return to your primary care physician (or establish a relationship with a primary care physician if you do not have one) for your aftercare needs so that they can reassess your need for medications and monitor your lab values.  Discharge Instructions       Diet - low sodium heart healthy   Complete by: As directed    Increase activity slowly   Complete by: As directed       Allergies as of 03/26/2020       Reactions   Angiotensin Receptor Blockers Anaphylaxis, Other (See Comments)   (angioedema also with lisinopril, therefore ARB's are contraindicated)   Lisinopril Anaphylaxis, Swelling   Throat swelling   Pamelor [nortriptyline] Anaphylaxis, Swelling   Throat swelling        Medication List     STOP taking these medications    Eliquis 5 MG Tabs tablet Generic drug: apixaban       TAKE these medications    amiodarone 200 MG tablet Commonly known as: PACERONE Take 200 mg by mouth daily.   amitriptyline 10 MG tablet Commonly known as: ELAVIL Take 20 mg by mouth at bedtime.   atorvastatin 20 MG tablet Commonly known as: LIPITOR Take 20 mg by mouth at bedtime.   cyanocobalamin 1000 MCG tablet Take 1 tablet (1,000 mcg total) by mouth daily. Start taking on: March 27, 2020   digoxin 0.125 MG tablet Commonly known as: LANOXIN Take 125 mcg by mouth daily.   DULoxetine 60 MG capsule Commonly known as: CYMBALTA Take 60 mg by mouth daily.   folic acid 1  MG tablet Commonly known as: FOLVITE Take 1 tablet (1 mg total) by mouth daily. Start taking on: March 27, 2020   gabapentin 300 MG capsule Commonly known as: NEURONTIN Take 600 mg by mouth 2 (two) times daily.   isosorbide-hydrALAZINE 20-37.5 MG tablet Commonly known as: BIDIL Take 1 tablet by mouth in the morning and at bedtime.   mirtazapine 15 MG tablet Commonly known as: REMERON Take 15 mg by mouth at bedtime.   potassium chloride SA 20 MEQ tablet Commonly known as: KLOR-CON Take 20 mEq by mouth daily.   sildenafil 50 MG tablet Commonly known as: VIAGRA Take 50 mg by mouth daily as needed for erectile dysfunction.   spironolactone 25 MG tablet Commonly known as: ALDACTONE Take 25 mg by mouth daily.   torsemide 20 MG tablet Commonly known  as: DEMADEX Take 40-60 mg by mouth See admin instructions. Take 3 tablets (60 mg) by mouth every morning and 2 tablets (40 mg) at night   traMADol 50 MG tablet Commonly known as: Ultram Take 1 tablet (50 mg total) by mouth every 6 (six) hours as needed for up to 3 days for moderate pain.   zolpidem 5 MG tablet Commonly known as: AMBIEN Take 5 mg by mouth at bedtime.       Allergies  Allergen Reactions   Angiotensin Receptor Blockers Anaphylaxis and Other (See Comments)    (angioedema also with lisinopril, therefore ARB's are contraindicated)   Lisinopril Anaphylaxis and Swelling    Throat swelling   Pamelor [Nortriptyline] Anaphylaxis and Swelling    Throat swelling   Follow-up Information     Charlott Rakes, MD. Schedule an appointment as soon as possible for a visit in 1 week(s).   Specialty: Family Medicine Contact information: [place] [address] [phone]             The results of significant diagnostics from this hospitalization (including imaging, microbiology, ancillary and laboratory) are listed below for reference.    Significant Diagnostic Studies: DG Pelvis 1-2 Views  Result Date: 03/23/2020 CLINICAL DATA:  [place], found in yard, ethanol present EXAM: PELVIS - 1-2 VIEW COMPARISON:  Portable exam 1348 hours compared to 08/16/2018 FINDINGS: Osseous demineralization. Hip and SI joint spaces preserved. Minimal regularity at the lateral margins of the femoral head/neck junctions bilaterally likely degenerative. No acute fracture, dislocation or bone destruction. IMPRESSION: No acute abnormalities. Electronically Signed   By: Lavonia Dana M.D.   On: 03/23/2020 14:09   CT Head Wo Contrast  Result Date: 03/23/2020 CLINICAL DATA:  Ground level fall EXAM: CT HEAD WITHOUT CONTRAST CT CERVICAL SPINE WITHOUT CONTRAST TECHNIQUE: Multidetector CT imaging of the head and cervical spine was performed following the standard protocol without intravenous  contrast. Multiplanar CT image reconstructions of the cervical spine were also generated. COMPARISON:  CT brain and cervical spine 03/16/2020 FINDINGS: CT HEAD FINDINGS Brain: No acute territorial infarction, hemorrhage, or intracranial mass. Mild atrophy. Patchy hypodensity in the white matter consistent with chronic small vessel ischemic change. The ventricles are nonenlarged Vascular: No hyperdense vessels.  Carotid vascular calcification. Skull: Normal. Negative for fracture or focal lesion. Sinuses/Orbits: Mild mucosal thickening in the maxillary and ethmoid sinuses Other: None CT CERVICAL SPINE FINDINGS Alignment: Mild motion degradation. Sagittal alignment is stable with mild reversal of cervical lordosis. Facet alignment is maintained. There is rotated appearance of C1 on C2 which is probably positional. Skull base and vertebrae: No acute fracture. No primary bone lesion or focal pathologic process.  Soft tissues and spinal canal: No prevertebral fluid or swelling. No visible canal hematoma. Disc levels: Mild to moderate degenerative changes at C4-C5 and C5-C6. Posterior disc osteophyte complex at C4-C5 and C5-C6 results in at least mild canal stenosis at C4-C5 and moderate canal stenosis at C5-C6. Facet degenerative changes at multiple levels with bilateral foraminal stenosis, mild at C4-C5 and moderate at C5-C6. Upper chest: Negative. Other: Carotid vascular calcification. IMPRESSION: 1. No CT evidence for acute intracranial abnormality. Atrophy and chronic small vessel ischemic changes of the white matter. 2. Degenerative changes of the cervical spine. No definitive fracture is seen. Rotated appearance of C1 on C2 is similar as compared with 03/16/2020 and suspected to be secondary to positioning. Electronically Signed   By: Donavan Foil M.D.   On: 03/23/2020 15:40   CT Cervical Spine Wo Contrast  Result Date: 03/23/2020 CLINICAL DATA:  Ground level fall EXAM: CT HEAD WITHOUT CONTRAST CT CERVICAL SPINE  WITHOUT CONTRAST TECHNIQUE: Multidetector CT imaging of the head and cervical spine was performed following the standard protocol without intravenous contrast. Multiplanar CT image reconstructions of the cervical spine were also generated. COMPARISON:  CT brain and cervical spine 03/16/2020 FINDINGS: CT HEAD FINDINGS Brain: No acute territorial infarction, hemorrhage, or intracranial mass. Mild atrophy. Patchy hypodensity in the white matter consistent with chronic small vessel ischemic change. The ventricles are nonenlarged Vascular: No hyperdense vessels.  Carotid vascular calcification. Skull: Normal. Negative for fracture or focal lesion. Sinuses/Orbits: Mild mucosal thickening in the maxillary and ethmoid sinuses Other: None CT CERVICAL SPINE FINDINGS Alignment: Mild motion degradation. Sagittal alignment is stable with mild reversal of cervical lordosis. Facet alignment is maintained. There is rotated appearance of C1 on C2 which is probably positional. Skull base and vertebrae: No acute fracture. No primary bone lesion or focal pathologic process. Soft tissues and spinal canal: No prevertebral fluid or swelling. No visible canal hematoma. Disc levels: Mild to moderate degenerative changes at C4-C5 and C5-C6. Posterior disc osteophyte complex at C4-C5 and C5-C6 results in at least mild canal stenosis at C4-C5 and moderate canal stenosis at C5-C6. Facet degenerative changes at multiple levels with bilateral foraminal stenosis, mild at C4-C5 and moderate at C5-C6. Upper chest: Negative. Other: Carotid vascular calcification. IMPRESSION: 1. No CT evidence for acute intracranial abnormality. Atrophy and chronic small vessel ischemic changes of the white matter. 2. Degenerative changes of the cervical spine. No definitive fracture is seen. Rotated appearance of C1 on C2 is similar as compared with 03/16/2020 and suspected to be secondary to positioning. Electronically Signed   By: Donavan Foil M.D.   On:  03/23/2020 15:40   DG Chest Port 1 View  Result Date: 03/23/2020 CLINICAL DATA:  Pain following fall EXAM: PORTABLE CHEST 1 VIEW COMPARISON:  March 16, 2020 FINDINGS: Lungs are clear. There is cardiomegaly with pulmonary vascularity normal. No adenopathy. There is aortic atherosclerosis. No pneumothorax. No bone lesions. IMPRESSION: Stable cardiomegaly. Lungs clear. No pneumothorax. Aortic Atherosclerosis (ICD10-I70.0). Electronically Signed   By: Lowella Grip III M.D.   On: 03/23/2020 14:04    Microbiology: Recent Results (from the past 240 hour(s))  SARS CORONAVIRUS 2 (TAT 6-24 HRS) Nasopharyngeal Nasopharyngeal Swab     Status: None   Collection Time: 03/23/20  2:39 PM   Specimen: Nasopharyngeal Swab  Result Value Ref Range Status   SARS Coronavirus 2 NEGATIVE NEGATIVE Final    Comment: (NOTE) SARS-CoV-2 target nucleic acids are NOT DETECTED. The SARS-CoV-2 RNA is generally detectable in upper and lower  respiratory specimens during the acute phase of infection. Negative results do not preclude SARS-CoV-2 infection, do not rule out co-infections with other pathogens, and should not be used as the sole basis for treatment or other patient management decisions. Negative results must be combined with clinical observations, patient history, and epidemiological information. The expected result is Negative. Fact Sheet for Patients: SugarRoll.be Fact Sheet for Healthcare Providers: https://www.woods-mathews.com/ This test is not yet approved or cleared by the [place] FDA and  has been authorized for detection and/or diagnosis of SARS-CoV-2 by FDA under an Emergency Use Authorization (EUA). This EUA will remain  in effect (meaning this test can be used) for the duration of the COVID-19 declaration under Section 56 4(b)(1) of the Act, 21 U.S.C. section 360bbb-3(b)(1), unless the authorization is terminated or revoked sooner. Performed at  [facility], [place] [address], [place], [place] [address]      Labs: Basic Metabolic Panel: Recent Labs  Lab 03/23/20 1728 03/23/20 1728 03/23/20 2058 03/23/20 2257 03/24/20 0243 03/25/20 0344 03/26/20 0302  NA 119*   < > 119* 123* 125* 126* 124*  K 4.2   < > 5.0 4.1 4.4 4.3 4.4  CL 84*   < > 85* 87* 87* 90* 90*  CO2 22   < > 19* 23 24 26 23   GLUCOSE 125*   < > 142* 117* 144* 161* 143*  BUN 11   < > 14 11 11 16 18   CREATININE 1.25*  1.30*   < > 1.28* 1.31* 1.28* 1.37* 1.61*  CALCIUM 8.1*   < > 8.2* 8.2* 8.9 8.9 8.7*  MG 1.5*  --   --   --   --  1.9 1.7  PHOS 2.6  --   --   --   --  2.9 3.3   < > = values in this interval not displayed.   Liver Function Tests: No results for input(s): AST, ALT, ALKPHOS, BILITOT, PROT, ALBUMIN in the last 168 hours. No results for input(s): LIPASE, AMYLASE in the last 168 hours. No results for input(s): AMMONIA in the last 168 hours. CBC: Recent Labs  Lab 03/23/20 1403 03/23/20 1728 03/24/20 0243 03/25/20 0344 03/26/20 0302  WBC 3.4* 2.7* 2.8* 3.8* 4.1  NEUTROABS 2.1  --   --  2.6 3.1  HGB 11.8* 11.5* 11.9* 11.1* 11.4*  HCT 33.8* 32.9*  33.4* 34.6* 32.8* 33.7*  MCV 90.9 90.4 89.6 92.9 93.6  PLT 208 201 201 154 158   Cardiac Enzymes: No results for input(s): CKTOTAL, CKMB, CKMBINDEX, TROPONINI in the last 168 hours. BNP: BNP (last 3 results) No results for input(s): BNP in the last 8760 hours.  ProBNP (last 3 results) No results for input(s): PROBNP in the last 8760 hours.  CBG: Recent Labs  Lab 03/25/20 0841 03/25/20 1130 03/25/20 1654 03/25/20 2137 03/26/20 0614  GLUCAP 184* 167* 179* 230* 160*       Signed:  Alma Friendly, MD [facility] 03/26/2020, 11:17 AM

## 2020-03-26 NOTE — TOC Transition Note (Signed)
Transition of Care [facility]) - CM/SW Discharge Note Marvetta Gibbons RN, BSN Transitions of Care Unit 4E- RN Case Manager [phone]    Patient Details  Name: Brad [name] MRN: [id number] Date of Birth: 02-21-1961  Transition of Care [facility]) CM/SW Contact:  [name], RN Phone Number: 03/26/2020, 2:10 PM   Clinical Narrative:    Pt stable for transition home today, orders placed for HHPT/OT, CM spoke with pt at bedside- per pt he has an apartment that he stays at with roommate. Confirmed address and phone # with pt. Pt reports that he goes to the [facility] - followed by Dr. Margarita Rana. - Pt will have transportation home. Per pt he has both cane and walker at home- no other DME needs noted. List provided to pt Per CMS guidelines from medicare.gov website with star ratings (copy placed in shadow chart) for [facility] choice- per pt he has no preference and states he gives permission for CM to find agency on his behalf.  Call made to [facility] with [facility] - per [facility] staffing is tight and they can see pt at the end of week- however can accept referral. Spoke with pt and he is ok with start of care and will go with [facility].     Final next level of care: [place] Barriers to Discharge: No Barriers Identified   Patient Goals and CMS Choice Patient states their goals for this hospitalization and ongoing recovery are:: to get to where I can get a job CMS Medicare.gov Compare Post Acute Care list provided to:: Patient Choice offered to / list presented to : Patient  Discharge Placement               Home with [facility]        Discharge Plan and Services   Discharge Planning Services: CM Consult Post Acute Care Choice: Home Health          DME Arranged: N/A DME Agency: NA       HH Arranged: PT, OT HH Agency: [facility] Date [place]: 03/26/20 Time [place]: 1410 Representative spoke with at [place]: [place]  ([place]) Interventions     Readmission Risk Interventions Readmission Risk Prevention Plan 03/26/2020  Transportation Screening Complete  PCP or Specialist Appt within 5-7 Days Complete  Home Care Screening Complete  Medication Review (RN CM) Complete

## 2020-03-26 NOTE — Consult Note (Signed)
WOC Nurse Consult Note: Reason for Consult: right great toe ulcer Patient self reports he "cut his toenails too short". Wound came after that. Noted ulcerations that is closed on the dorsal left foot as well as small black area that is not open on the left great toe.  All areas are hard and dry. Distal pulses are weak.  Wound type: trauma in the presence of DM Pressure Injury POA: NA Measurement: Right great toe: 0.2cm x 0.2cm x 0.1cm Left great toe: pinpoint Left dorsal foot: 0.4cm x 0.4cm x 0cm  Wound bed: all are hard and dry Drainage (amount, consistency, odor) none Periwound:  intact Dressing procedure/placement/frequency: Paint areas with betadine; keep hard and dry with history of DM.   Discussed POC with patient and bedside nurse.  Re consult if needed, will not follow at this time. Thanks  Marae Cottrell [employer], RN,CWOCN, CNS, [place] [phone])

## 2020-03-26 NOTE — Progress Notes (Signed)
Pt walked in the wall with front wheel walker for 200 ft. Pt tolerated well.   Lavenia Atlas, RN

## 2020-03-27 ENCOUNTER — Telehealth: Payer: Self-pay

## 2020-03-27 ENCOUNTER — Encounter (HOSPITAL_COMMUNITY): Payer: Self-pay

## 2020-03-27 NOTE — Telephone Encounter (Signed)
Transition Care Management Follow-up Telephone Call  Date of discharge and from where: 03/26/2020, [facility]   How have you been since you were released from the hospital? He said that he still feels "a little sick" and relates it to not drinking alcohol.  Any questions or concerns?  none at this time. No falls since returning home.    Items Reviewed:  Did the pt receive and understand the discharge instructions provided? yes  Medications obtained and verified? he said that he knows he is to stop taking eliquis  He has not picked up the tramadol, cyanocobalamin and folic acid and said he would get them tomorrow. He has all other medications and community paramedic will fill med box for him this week.   Any new allergies since your discharge? none reported   Do you have support at home?  has roommate  Other (ie: DME, Home Health, etc) home PT /OT ordered through [facility] was arranged at discharge.   Has community paramedicine visit weekly for assessment and medication management.   Has glucometer  Has cane and walker  Functional Questionnaire: (I = Independent and D = Dependent) ADL's: independent.  Roommate assists when needed.  Community paramedic oversees medication regime.    Follow up appointments reviewed:    PCP Hospital f/u appt confirmed? [facility]ment with Dr Margarita Rana - 04/12/2020 @ [facility] f/u appt confirmed?  GI - 03/29/2020;oncology - 04/02/2020; podiatry -  04/09/2020.   Are transportation arrangements needed?  no, has [employer] and roommate drives.  Also needs to register for medicaid transportation.   If their condition worsens, is the pt aware to call  their PCP or go to the ED?} yes Was the patient provided with contact information for the PCP's office or ED?  he has the phone number or the clinic  Was the pt encouraged to call back with questions or concerns? yes

## 2020-03-27 NOTE — Telephone Encounter (Signed)
From the discharge call.  Has appointment with Dr Margarita Rana 04/12/2020  he said that he knows he is to stop taking eliquis  He has not picked up the tramadol, cyanocobalamin and folic acid and said he would get them tomorrow. He has all other medications and community paramedic will fill med box for him this week.   no concerns /questions at this time

## 2020-03-28 ENCOUNTER — Other Ambulatory Visit (HOSPITAL_COMMUNITY): Payer: Self-pay

## 2020-03-28 ENCOUNTER — Other Ambulatory Visit: Payer: Self-pay | Admitting: Family Medicine

## 2020-03-28 DIAGNOSIS — E1140 Type 2 diabetes mellitus with diabetic neuropathy, unspecified: Secondary | ICD-10-CM

## 2020-03-28 NOTE — Progress Notes (Signed)
Paramedicine Encounter    Patient ID: Brad [name], male    DOB: 04-08-1961, 59 y.o.   MRN: [id number]   Patient Care Team: Charlott Rakes, MD as PCP - General (Family Medicine) Debara Pickett Nadean Corwin, MD as PCP - Cardiology (Cardiology) Charlott Rakes, MD (Family Medicine) Charlott Rakes, MD (Family Medicine)  Patient Active Problem List   Diagnosis Date Noted  . Alcohol abuse with intoxication ([place]) 03/23/2020  . Hyponatremia 03/23/2020  . Fall 03/23/2020  . Normocytic anemia 03/23/2020  . Leukopenia 03/23/2020  . HTN (hypertension) 03/23/2020  . HLD (hyperlipidemia) 03/23/2020  . AKI (acute kidney injury) ([place]) 03/23/2020  . CHF (congestive heart failure) ([place]) 03/23/2020  . Alcohol abuse with intoxication ([place]) 03/05/2020  . Chronic systolic CHF (congestive heart failure) ([place]) 10/31/2019  . Acute systolic CHF (congestive heart failure) ([place]) 08/02/2019  . Diarrhea 07/29/2019  . Gastroenteritis 07/22/2019  . Hypomagnesemia 06/19/2019  . Chest pain 06/07/2019  . Elevated troponin 05/23/2019  . Tobacco abuse 05/23/2019  . Alcohol abuse 02/21/2019  . Frequent falls 01/17/2019  . Severe mitral regurgitation   . Fall 11/01/2018  . Hyponatremia 10/31/2018  . Chronic atrial fibrillation   . Chronic hyponatremia 08/16/2018  . NSVT (nonsustained ventricular tachycardia) ([place])   . Concussion with loss of consciousness   . Scalp laceration   . Trauma   . Permanent atrial fibrillation   . Hypertensive heart disease   . Shortness of breath   . Pyogenic inflammation of bone ([place])   . Cirrhosis ([place]) 03/23/2018  . Right ankle pain 03/23/2018  . Syncope 08/07/2017  . Acute on chronic combined systolic and diastolic CHF (congestive heart failure) ([place]) 07/09/2017  . Atrial flutter ([place]) 07/09/2017  . Pre-syncope 07/08/2017  . Neuropathy 05/08/2017  . Substance induced mood disorder ([place]) 10/06/2016  . CVA (cerebral vascular accident) ([place]) 09/18/2016  . Left sided numbness    . Homelessness 08/21/2016  . S/P ORIF (open reduction internal fixation) fracture 08/01/2016  . CAD (coronary artery disease), native coronary artery 07/30/2016  . Surgery, elective   . Insomnia 07/22/2016  . NSTEMI (non-ST elevated myocardial infarction) ([place])   . Anemia 07/05/2016  . Thrombocytopenia ([place]) 07/05/2016  . Cocaine abuse ([place]) 07/02/2016  . Chest pain on breathing 07/01/2016  . Essential hypertension 07/01/2016  . DM type 2 (diabetes mellitus, type 2) ([place]) 07/01/2016  . Hypokalemia 07/01/2016  . CKD (chronic kidney disease) stage 3, GFR 30-59 ml/min 07/01/2016  . Painful diabetic neuropathy ([place]) 07/01/2016  . Acute hyponatremia 05/27/2016  . Polysubstance abuse ([place]) 05/27/2016  . Chronic hepatitis C with cirrhosis ([place]) 05/27/2016  . Depression 04/21/2012  . GERD (gastroesophageal reflux disease) 02/16/2012  . History of drug abuse ([place])   . Heroin addiction ([place]) 01/29/2012    Current Outpatient Medications:  .  albuterol (VENTOLIN HFA) 108 (90 Base) MCG/ACT inhaler, Inhale 2 puffs into the lungs every 6 (six) hours as needed for shortness of breath. , Disp: , Rfl:  .  amiodarone (PACERONE) 200 MG tablet, Take 200 mg by mouth daily., Disp: , Rfl:  .  amiodarone (PACERONE) 200 MG tablet, Take 200 mg by mouth daily. , Disp: , Rfl:  .  amitriptyline (ELAVIL) 10 MG tablet, Take 20 mg by mouth at bedtime., Disp: , Rfl:  .  amitriptyline (ELAVIL) 10 MG tablet, Take 20 mg by mouth at bedtime., Disp: , Rfl:  .  atorvastatin (LIPITOR) 20 MG tablet, TAKE 1 TABLET (20 MG TOTAL) BY MOUTH DAILY AT 6 PM., Disp:  30 tablet, Rfl: 1 .  atorvastatin (LIPITOR) 20 MG tablet, Take 20 mg by mouth at bedtime., Disp: , Rfl:  .  digoxin (LANOXIN) 0.125 MG tablet, Take 1 tablet (0.125 mg total) by mouth daily., Disp: 30 tablet, Rfl: 11 .  digoxin (LANOXIN) 0.125 MG tablet, Take 125 mcg by mouth daily., Disp: , Rfl:  .  DULoxetine (CYMBALTA) 60 MG capsule, Take 1 capsule (60 mg total) by  mouth daily., Disp: 30 capsule, Rfl: 6 .  DULoxetine (CYMBALTA) 60 MG capsule, Take 60 mg by mouth daily., Disp: , Rfl:  .  gabapentin (NEURONTIN) 300 MG capsule, Take 2 capsules (600 mg total) by mouth 2 (two) times daily., Disp: 120 capsule, Rfl: 3 .  gabapentin (NEURONTIN) 300 MG capsule, Take 600 mg by mouth 2 (two) times daily., Disp: , Rfl:  .  isosorbide-hydrALAZINE (BIDIL) 20-37.5 MG tablet, Take 1 tablet by mouth 3 (three) times daily., Disp: 90 tablet, Rfl: 11 .  isosorbide-hydrALAZINE (BIDIL) 20-37.5 MG tablet, Take 1 tablet by mouth in the morning and at bedtime., Disp: , Rfl:  .  mirtazapine (REMERON) 15 MG tablet, Take 15 mg by mouth at bedtime., Disp: , Rfl:  .  mirtazapine (REMERON) 30 MG tablet, Take 30 mg by mouth at bedtime., Disp: , Rfl:  .  potassium chloride SA (KLOR-CON) 20 MEQ tablet, Take 1 tablet (20 mEq total) by mouth daily., Disp:  , Rfl:  .  potassium chloride SA (KLOR-CON) 20 MEQ tablet, Take 20 mEq by mouth daily., Disp: , Rfl:  .  spironolactone (ALDACTONE) 25 MG tablet, Take 1 tablet (25 mg total) by mouth daily., Disp: 30 tablet, Rfl: 11 .  spironolactone (ALDACTONE) 25 MG tablet, Take 25 mg by mouth daily., Disp: , Rfl:  .  torsemide (DEMADEX) 20 MG tablet, Take 40-60 mg by mouth See admin instructions. Take 3 tablets (60 mg) by mouth every morning and 2 tablets (40 mg) at night, Disp: , Rfl:  .  ACCU-CHEK AVIVA PLUS test strip, USE AS DIRECTED THREE TIMES DAILY (Patient not taking: Reported on 03/12/2020), Disp: 100 each, Rfl: 12 .  folic acid (FOLVITE) 1 MG tablet, Take 1 tablet (1 mg total) by mouth daily., Disp: 30 tablet, Rfl: 0 .  sildenafil (VIAGRA) 50 MG tablet, Take 50 mg by mouth daily as needed for erectile dysfunction., Disp: , Rfl:  .  torsemide (DEMADEX) 20 MG tablet, Take 2 tablets (40 mg total) by mouth in the morning AND 1 tablet (20 mg total) every evening., Disp: 90 tablet, Rfl: 0 .  traMADol (ULTRAM) 50 MG tablet, Take 1 tablet (50 mg total) by  mouth every 6 (six) hours as needed for up to 3 days for moderate pain. (Patient not taking: Reported on 03/28/2020), Disp: 12 tablet, Rfl: 0 .  vitamin B-12 1000 MCG tablet, Take 1 tablet (1,000 mcg total) by mouth daily. (Patient not taking: Reported on 03/28/2020), Disp: 30 tablet, Rfl: 0 .  zolpidem (AMBIEN) 5 MG tablet, Take 5 mg by mouth at bedtime. , Disp: , Rfl:  Allergies  Allergen Reactions  . Angiotensin Receptor Blockers Anaphylaxis and Other (See Comments)    (Angioedema also with Lisinopril, therefore ARB's are contraindicated)  . Lisinopril Anaphylaxis and Other (See Comments)    Throat swells  . Lisinopril Anaphylaxis and Swelling    Throat swelling  . Pamelor [Nortriptyline Hcl] Anaphylaxis and Swelling    Throat swells  . Pamelor [Nortriptyline] Anaphylaxis and Swelling    Throat swelling  Social History   Socioeconomic History  . Marital status: Single    Spouse name: Not on file  . Number of children: 3  . Years of education: 2y college  . Highest education level: Not on file  Occupational History  . 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 Smoking status: Current Every Day Smoker    Packs/day: 0.50    Years: 28.00    Pack years: 14.00    Types: Cigarettes  . Smokeless tobacco: Never Used  Substance and Sexual Activity  . Alcohol use: Yes    Alcohol/week: 25.0 standard drinks    Types: 25 Cans of beer per week    Comment: "depends on the day"; alcohol anonymous  . Drug use: Not Currently    Types: IV, Cocaine, Heroin    Comment: 08/17/2018 "no heroin since 2013; I use cocaine ~ once/month, last use 03/21/2019  . Sexual activity: Not Currently  Other Topics Concern  . Not on file  Social History Narrative   ** Merged History Encounter **       ** Merged History Encounter **       Incarcerated from 2006-2010, then 10/2011-12/2011.  Has been trying to get sober (no heroin, alcohol since 10/2011).    Social  Determinants of Health   Financial Resource Strain:   . Difficulty of Paying Living Expenses:   Food Insecurity:   . Worried About Charity fundraiser in the Last Year:   . Arboriculturist in the Last Year:   Transportation Needs:   . Film/video editor (Medical):   Marland Kitchen Lack of Transportation (Non-Medical):   Physical Activity:   . Days of Exercise per Week:   . Minutes of Exercise per Session:   Stress:   . Feeling of Stress :   Social Connections:   . Frequency of Communication with Friends and Family:   . Frequency of Social Gatherings with Friends and Family:   . Attends Religious Services:   . Active Member of Clubs or Organizations:   . Attends Archivist Meetings:   Marland Kitchen Marital Status:   Intimate Partner Violence:   . Fear of Current or Ex-Partner:   . Emotionally Abused:   Marland Kitchen Physically Abused:   . Sexually Abused:     Physical Exam Vitals reviewed.  Constitutional:      Appearance: He is normal weight.  HENT:     Head: Normocephalic.     Nose: Nose normal.     Mouth/Throat:     Mouth: Mucous membranes are moist.     Pharynx: Oropharynx is clear.  Eyes:     Pupils: Pupils are equal, round, and reactive to light.  Cardiovascular:     Rate and Rhythm: Normal rate and regular rhythm.     Pulses: Normal pulses.     Heart sounds: Normal heart sounds.  Pulmonary:     Effort: Pulmonary effort is normal.     Breath sounds: Normal breath sounds.  Abdominal:     General: Abdomen is flat.     Palpations: Abdomen is soft.  Musculoskeletal:        General: Normal range of motion.     Cervical back: Normal range of motion and neck supple.     Right lower leg: Edema present.     Left lower leg: Edema present.  Skin:    General: Skin is warm and dry.  Capillary Refill: Capillary refill takes less than 2 seconds.  Neurological:     Mental Status: He is alert. Mental status is at baseline.  Psychiatric:        Mood and Affect: Mood normal.    Arrived  for home visit for Graham who was standing outside on the sidewalk of his apartment complex. Sahr reports feeling better today than the days previously because he was in the hospital. Braxtynn stated he had another fall last week while intoxicated and walking without his cane or walker resulting in his striking his face. I educated Sosuke about the importance of decreasing his alcohol usage as well as the importance of using his cane or walker when walking. He verbalized understanding. Medications were reviewed and confirmed. Pill box was filled accordingly. Vitals were obtained and are as noted. Mild edema noted in both lower legs. [place] reports no shortness of breath, dizziness, chest pain but does report trouble with his balance. I continued to express the importance of the use of his cane or walker. He agreed. New scale left with patient.  List of appointments left with patient. Home visit complete.   Refills: Gabapentin   Weight- 179lbs         Future Appointments  Date Time Provider [place]  03/29/2020  2:10 PM Mansouraty, Telford Nab., MD [facility]  04/02/2020  1:00 PM Brunetta Genera, MD [facility] None  04/04/2020 10:50 AM Charlott Rakes, MD [facility] None  04/09/2020 10:00 AM Trula Slade, DPM [facility]     ACTION: Home visit completed Next visit planned for one week

## 2020-03-29 ENCOUNTER — Ambulatory Visit: Payer: Medicaid Other | Admitting: Gastroenterology

## 2020-03-30 ENCOUNTER — Encounter (HOSPITAL_COMMUNITY): Payer: Self-pay | Admitting: Emergency Medicine

## 2020-03-30 ENCOUNTER — Inpatient Hospital Stay (HOSPITAL_COMMUNITY)
Admission: EM | Admit: 2020-03-30 | Discharge: 2020-04-08 | DRG: 640 | Disposition: A | Discharge status: Home Health | Payer: Medicaid Other | Attending: Internal Medicine | Admitting: Internal Medicine

## 2020-03-30 ENCOUNTER — Emergency Department (HOSPITAL_COMMUNITY): Payer: Medicaid Other

## 2020-03-30 ENCOUNTER — Other Ambulatory Visit: Payer: Self-pay

## 2020-03-30 DIAGNOSIS — B182 Chronic viral hepatitis C: Secondary | ICD-10-CM | POA: Diagnosis present

## 2020-03-30 DIAGNOSIS — I2510 Atherosclerotic heart disease of native coronary artery without angina pectoris: Secondary | ICD-10-CM | POA: Diagnosis present

## 2020-03-30 DIAGNOSIS — Z8673 Personal history of transient ischemic attack (TIA), and cerebral infarction without residual deficits: Secondary | ICD-10-CM

## 2020-03-30 DIAGNOSIS — F17210 Nicotine dependence, cigarettes, uncomplicated: Secondary | ICD-10-CM | POA: Diagnosis present

## 2020-03-30 DIAGNOSIS — E878 Other disorders of electrolyte and fluid balance, not elsewhere classified: Secondary | ICD-10-CM | POA: Diagnosis not present

## 2020-03-30 DIAGNOSIS — K709 Alcoholic liver disease, unspecified: Secondary | ICD-10-CM | POA: Diagnosis present

## 2020-03-30 DIAGNOSIS — I4821 Permanent atrial fibrillation: Secondary | ICD-10-CM | POA: Diagnosis present

## 2020-03-30 DIAGNOSIS — F10229 Alcohol dependence with intoxication, unspecified: Secondary | ICD-10-CM | POA: Diagnosis present

## 2020-03-30 DIAGNOSIS — Z96652 Presence of left artificial knee joint: Secondary | ICD-10-CM | POA: Diagnosis present

## 2020-03-30 DIAGNOSIS — Z20822 Contact with and (suspected) exposure to covid-19: Secondary | ICD-10-CM | POA: Diagnosis present

## 2020-03-30 DIAGNOSIS — E1122 Type 2 diabetes mellitus with diabetic chronic kidney disease: Secondary | ICD-10-CM | POA: Diagnosis present

## 2020-03-30 DIAGNOSIS — E1140 Type 2 diabetes mellitus with diabetic neuropathy, unspecified: Secondary | ICD-10-CM | POA: Diagnosis present

## 2020-03-30 DIAGNOSIS — W19XXXA Unspecified fall, initial encounter: Secondary | ICD-10-CM

## 2020-03-30 DIAGNOSIS — N1831 Chronic kidney disease, stage 3a: Secondary | ICD-10-CM | POA: Diagnosis present

## 2020-03-30 DIAGNOSIS — I5082 Biventricular heart failure: Secondary | ICD-10-CM | POA: Diagnosis present

## 2020-03-30 DIAGNOSIS — I5023 Acute on chronic systolic (congestive) heart failure: Secondary | ICD-10-CM | POA: Diagnosis present

## 2020-03-30 DIAGNOSIS — E7800 Pure hypercholesterolemia, unspecified: Secondary | ICD-10-CM | POA: Diagnosis present

## 2020-03-30 DIAGNOSIS — R079 Chest pain, unspecified: Secondary | ICD-10-CM

## 2020-03-30 DIAGNOSIS — F10929 Alcohol use, unspecified with intoxication, unspecified: Secondary | ICD-10-CM

## 2020-03-30 DIAGNOSIS — G9341 Metabolic encephalopathy: Secondary | ICD-10-CM | POA: Diagnosis present

## 2020-03-30 DIAGNOSIS — K219 Gastro-esophageal reflux disease without esophagitis: Secondary | ICD-10-CM | POA: Diagnosis present

## 2020-03-30 DIAGNOSIS — F10239 Alcohol dependence with withdrawal, unspecified: Secondary | ICD-10-CM | POA: Diagnosis present

## 2020-03-30 DIAGNOSIS — N179 Acute kidney failure, unspecified: Secondary | ICD-10-CM | POA: Diagnosis present

## 2020-03-30 DIAGNOSIS — E871 Hypo-osmolality and hyponatremia: Principal | ICD-10-CM | POA: Diagnosis present

## 2020-03-30 DIAGNOSIS — Z79899 Other long term (current) drug therapy: Secondary | ICD-10-CM

## 2020-03-30 DIAGNOSIS — K7030 Alcoholic cirrhosis of liver without ascites: Secondary | ICD-10-CM | POA: Diagnosis present

## 2020-03-30 DIAGNOSIS — Z8546 Personal history of malignant neoplasm of prostate: Secondary | ICD-10-CM

## 2020-03-30 DIAGNOSIS — R296 Repeated falls: Secondary | ICD-10-CM | POA: Diagnosis present

## 2020-03-30 DIAGNOSIS — I130 Hypertensive heart and chronic kidney disease with heart failure and stage 1 through stage 4 chronic kidney disease, or unspecified chronic kidney disease: Secondary | ICD-10-CM | POA: Diagnosis present

## 2020-03-30 DIAGNOSIS — I428 Other cardiomyopathies: Secondary | ICD-10-CM | POA: Diagnosis present

## 2020-03-30 LAB — BASIC METABOLIC PANEL
Anion gap: 12 (ref 5–15)
BUN: 16 mg/dL (ref 6–20)
CO2: 19 mmol/L — ABNORMAL LOW (ref 22–32)
Calcium: 8 mg/dL — ABNORMAL LOW (ref 8.9–10.3)
Chloride: 80 mmol/L — ABNORMAL LOW (ref 98–111)
Creatinine, Ser: 1.4 mg/dL — ABNORMAL HIGH (ref 0.61–1.24)
GFR calc Af Amer: >60 mL/min (ref >60)
GFR calc non Af Amer: 54 mL/min — ABNORMAL LOW (ref >60)
Glucose, Bld: 137 mg/dL — ABNORMAL HIGH (ref 70–99)
Potassium: 4.5 mmol/L (ref 3.5–5.1)
Sodium: 111 mmol/L — CL (ref 135–145)

## 2020-03-30 LAB — CBC
HCT: 34.1 % — ABNORMAL LOW (ref 39.0–52.0)
Hemoglobin: 12 g/dL — ABNORMAL LOW (ref 13.0–17.0)
MCH: 31.3 pg (ref 26.0–34.0)
MCHC: 35.2 g/dL (ref 30.0–36.0)
MCV: 88.8 fL (ref 80.0–100.0)
Platelets: 210 10*3/uL (ref 150–400)
RBC: 3.84 MIL/uL — ABNORMAL LOW (ref 4.22–5.81)
RDW: 13.4 % (ref 11.5–15.5)
WBC: 3.4 10*3/uL — ABNORMAL LOW (ref 4.0–10.5)
nRBC: 0 % (ref 0.0–0.2)

## 2020-03-30 LAB — CREATININE, URINE, RANDOM: Creatinine, Urine: 10.49 mg/dL

## 2020-03-30 LAB — URINALYSIS, ROUTINE W REFLEX MICROSCOPIC
Bacteria, UA: NONE SEEN
Bilirubin Urine: NEGATIVE
Glucose, UA: NEGATIVE mg/dL
Hgb urine dipstick: NEGATIVE
Ketones, ur: NEGATIVE mg/dL
Leukocytes,Ua: NEGATIVE
Nitrite: NEGATIVE
Protein, ur: 30 mg/dL — AB
Specific Gravity, Urine: 1.002 — ABNORMAL LOW (ref 1.005–1.030)
pH: 6 (ref 5.0–8.0)

## 2020-03-30 LAB — RESPIRATORY PANEL BY RT PCR (FLU A&B, COVID)
Influenza A by PCR: NEGATIVE
Influenza B by PCR: NEGATIVE
SARS Coronavirus 2 by RT PCR: NEGATIVE

## 2020-03-30 LAB — ETHANOL: Alcohol, Ethyl (B): 120 mg/dL — ABNORMAL HIGH (ref <10)

## 2020-03-30 LAB — SODIUM, URINE, RANDOM: Sodium, Ur: 38 mmol/L

## 2020-03-30 LAB — OSMOLALITY, URINE: Osmolality, Ur: 152 mOsm/kg — ABNORMAL LOW (ref 300–900)

## 2020-03-30 LAB — CBG MONITORING, ED
Glucose-Capillary: 134 mg/dL — ABNORMAL HIGH (ref 70–99)
Glucose-Capillary: 154 mg/dL — ABNORMAL HIGH (ref 70–99)

## 2020-03-30 LAB — OSMOLALITY: Osmolality: 270 mOsm/kg — ABNORMAL LOW (ref 275–295)

## 2020-03-30 MED ORDER — SODIUM CHLORIDE 0.9% FLUSH
3.0000 mL | Freq: Once | INTRAVENOUS | Status: AC
  Administered 2020-03-30: 3 mL via INTRAVENOUS

## 2020-03-30 MED ORDER — SODIUM CHLORIDE 0.9 % IV SOLN: Freq: Once | INTRAVENOUS | Status: AC

## 2020-03-30 NOTE — ED Provider Notes (Signed)
Patient seen and examined, agree with assessment plan by the APP.  In brief this patient is here for a fall, likely related to his alcohol use.  Also found to be profoundly hyponatremic worse than his typical presentation.  I discussed the case with Dr. Lucile Shutters, ICU, who will arrange for the patient to be admitted for hypertonic saline.   Truddie Hidden, MD 03/30/20 [phone]

## 2020-03-30 NOTE — ED Triage Notes (Signed)
Pt brought to triage via [facility] after fall in store.  Pt states he had a syncopal episode and fell.  C/o pain to back of head.  Denies neck and back pain.  States he was took off Eliquis 3 weeks ago.

## 2020-03-30 NOTE — ED Notes (Signed)
Dr. Karle Starch notified of [facility].

## 2020-03-30 NOTE — ED Notes (Signed)
Pt taken to CT by CT tech
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
## 2020-03-31 DIAGNOSIS — F10229 Alcohol dependence with intoxication, unspecified: Secondary | ICD-10-CM | POA: Diagnosis present

## 2020-03-31 DIAGNOSIS — G9341 Metabolic encephalopathy: Secondary | ICD-10-CM | POA: Diagnosis present

## 2020-03-31 DIAGNOSIS — Z96652 Presence of left artificial knee joint: Secondary | ICD-10-CM | POA: Diagnosis present

## 2020-03-31 DIAGNOSIS — E1122 Type 2 diabetes mellitus with diabetic chronic kidney disease: Secondary | ICD-10-CM | POA: Diagnosis present

## 2020-03-31 DIAGNOSIS — R079 Chest pain, unspecified: Secondary | ICD-10-CM | POA: Diagnosis not present

## 2020-03-31 DIAGNOSIS — F17210 Nicotine dependence, cigarettes, uncomplicated: Secondary | ICD-10-CM | POA: Diagnosis present

## 2020-03-31 DIAGNOSIS — Z8673 Personal history of transient ischemic attack (TIA), and cerebral infarction without residual deficits: Secondary | ICD-10-CM | POA: Diagnosis not present

## 2020-03-31 DIAGNOSIS — Z8546 Personal history of malignant neoplasm of prostate: Secondary | ICD-10-CM | POA: Diagnosis not present

## 2020-03-31 DIAGNOSIS — N1831 Chronic kidney disease, stage 3a: Secondary | ICD-10-CM | POA: Diagnosis present

## 2020-03-31 DIAGNOSIS — I428 Other cardiomyopathies: Secondary | ICD-10-CM | POA: Diagnosis present

## 2020-03-31 DIAGNOSIS — I5082 Biventricular heart failure: Secondary | ICD-10-CM | POA: Diagnosis present

## 2020-03-31 DIAGNOSIS — N179 Acute kidney failure, unspecified: Secondary | ICD-10-CM | POA: Diagnosis present

## 2020-03-31 DIAGNOSIS — Z20822 Contact with and (suspected) exposure to covid-19: Secondary | ICD-10-CM | POA: Diagnosis present

## 2020-03-31 DIAGNOSIS — I5023 Acute on chronic systolic (congestive) heart failure: Secondary | ICD-10-CM | POA: Diagnosis present

## 2020-03-31 DIAGNOSIS — E1140 Type 2 diabetes mellitus with diabetic neuropathy, unspecified: Secondary | ICD-10-CM | POA: Diagnosis present

## 2020-03-31 DIAGNOSIS — I5022 Chronic systolic (congestive) heart failure: Secondary | ICD-10-CM | POA: Diagnosis not present

## 2020-03-31 DIAGNOSIS — Z7289 Other problems related to lifestyle: Secondary | ICD-10-CM

## 2020-03-31 DIAGNOSIS — Z79899 Other long term (current) drug therapy: Secondary | ICD-10-CM | POA: Diagnosis not present

## 2020-03-31 DIAGNOSIS — I130 Hypertensive heart and chronic kidney disease with heart failure and stage 1 through stage 4 chronic kidney disease, or unspecified chronic kidney disease: Secondary | ICD-10-CM | POA: Diagnosis present

## 2020-03-31 DIAGNOSIS — F10929 Alcohol use, unspecified with intoxication, unspecified: Secondary | ICD-10-CM | POA: Diagnosis not present

## 2020-03-31 DIAGNOSIS — K7030 Alcoholic cirrhosis of liver without ascites: Secondary | ICD-10-CM | POA: Diagnosis present

## 2020-03-31 DIAGNOSIS — E871 Hypo-osmolality and hyponatremia: Principal | ICD-10-CM

## 2020-03-31 DIAGNOSIS — R296 Repeated falls: Secondary | ICD-10-CM | POA: Diagnosis present

## 2020-03-31 DIAGNOSIS — I340 Nonrheumatic mitral (valve) insufficiency: Secondary | ICD-10-CM | POA: Diagnosis not present

## 2020-03-31 DIAGNOSIS — K219 Gastro-esophageal reflux disease without esophagitis: Secondary | ICD-10-CM | POA: Diagnosis present

## 2020-03-31 DIAGNOSIS — F10239 Alcohol dependence with withdrawal, unspecified: Secondary | ICD-10-CM | POA: Diagnosis present

## 2020-03-31 DIAGNOSIS — I351 Nonrheumatic aortic (valve) insufficiency: Secondary | ICD-10-CM | POA: Diagnosis not present

## 2020-03-31 DIAGNOSIS — I4821 Permanent atrial fibrillation: Secondary | ICD-10-CM | POA: Diagnosis present

## 2020-03-31 DIAGNOSIS — I5043 Acute on chronic combined systolic (congestive) and diastolic (congestive) heart failure: Secondary | ICD-10-CM | POA: Diagnosis not present

## 2020-03-31 DIAGNOSIS — B182 Chronic viral hepatitis C: Secondary | ICD-10-CM | POA: Diagnosis present

## 2020-03-31 DIAGNOSIS — E7800 Pure hypercholesterolemia, unspecified: Secondary | ICD-10-CM | POA: Diagnosis present

## 2020-03-31 LAB — BASIC METABOLIC PANEL
Anion gap: 12 (ref 5–15)
Anion gap: 11 (ref 5–15)
Anion gap: 11 (ref 5–15)
Anion gap: 11 (ref 5–15)
Anion gap: 11 (ref 5–15)
BUN: 16 mg/dL (ref 6–20)
BUN: 18 mg/dL (ref 6–20)
BUN: 18 mg/dL (ref 6–20)
BUN: 18 mg/dL (ref 6–20)
BUN: 20 mg/dL (ref 6–20)
CO2: 23 mmol/L (ref 22–32)
CO2: 22 mmol/L (ref 22–32)
CO2: 22 mmol/L (ref 22–32)
CO2: 23 mmol/L (ref 22–32)
CO2: 23 mmol/L (ref 22–32)
Calcium: 8.3 mg/dL — ABNORMAL LOW (ref 8.9–10.3)
Calcium: 8.2 mg/dL — ABNORMAL LOW (ref 8.9–10.3)
Calcium: 8.1 mg/dL — ABNORMAL LOW (ref 8.9–10.3)
Calcium: 8.3 mg/dL — ABNORMAL LOW (ref 8.9–10.3)
Calcium: 8.1 mg/dL — ABNORMAL LOW (ref 8.9–10.3)
Chloride: 85 mmol/L — ABNORMAL LOW (ref 98–111)
Chloride: 86 mmol/L — ABNORMAL LOW (ref 98–111)
Chloride: 85 mmol/L — ABNORMAL LOW (ref 98–111)
Chloride: 85 mmol/L — ABNORMAL LOW (ref 98–111)
Chloride: 86 mmol/L — ABNORMAL LOW (ref 98–111)
Creatinine, Ser: 1.35 mg/dL — ABNORMAL HIGH (ref 0.61–1.24)
Creatinine, Ser: 1.28 mg/dL — ABNORMAL HIGH (ref 0.61–1.24)
Creatinine, Ser: 1.34 mg/dL — ABNORMAL HIGH (ref 0.61–1.24)
Creatinine, Ser: 1.39 mg/dL — ABNORMAL HIGH (ref 0.61–1.24)
Creatinine, Ser: 1.51 mg/dL — ABNORMAL HIGH (ref 0.61–1.24)
GFR calc Af Amer: >60 mL/min (ref >60)
GFR calc Af Amer: >60 mL/min (ref >60)
GFR calc Af Amer: >60 mL/min (ref >60)
GFR calc Af Amer: >60 mL/min (ref >60)
GFR calc Af Amer: 57 mL/min — ABNORMAL LOW (ref >60)
GFR calc non Af Amer: 57 mL/min — ABNORMAL LOW (ref >60)
GFR calc non Af Amer: >60 mL/min (ref >60)
GFR calc non Af Amer: 57 mL/min — ABNORMAL LOW (ref >60)
GFR calc non Af Amer: 55 mL/min — ABNORMAL LOW (ref >60)
GFR calc non Af Amer: 49 mL/min — ABNORMAL LOW (ref >60)
Glucose, Bld: 158 mg/dL — ABNORMAL HIGH (ref 70–99)
Glucose, Bld: 149 mg/dL — ABNORMAL HIGH (ref 70–99)
Glucose, Bld: 162 mg/dL — ABNORMAL HIGH (ref 70–99)
Glucose, Bld: 146 mg/dL — ABNORMAL HIGH (ref 70–99)
Glucose, Bld: 133 mg/dL — ABNORMAL HIGH (ref 70–99)
Potassium: 4.2 mmol/L (ref 3.5–5.1)
Potassium: 4.2 mmol/L (ref 3.5–5.1)
Potassium: 4 mmol/L (ref 3.5–5.1)
Potassium: 4.2 mmol/L (ref 3.5–5.1)
Potassium: 4.1 mmol/L (ref 3.5–5.1)
Sodium: 120 mmol/L — ABNORMAL LOW (ref 135–145)
Sodium: 119 mmol/L — CL (ref 135–145)
Sodium: 118 mmol/L — CL (ref 135–145)
Sodium: 119 mmol/L — CL (ref 135–145)
Sodium: 120 mmol/L — ABNORMAL LOW (ref 135–145)

## 2020-03-31 LAB — GLUCOSE, CAPILLARY
Glucose-Capillary: 153 mg/dL — ABNORMAL HIGH (ref 70–99)
Glucose-Capillary: 156 mg/dL — ABNORMAL HIGH (ref 70–99)
Glucose-Capillary: 151 mg/dL — ABNORMAL HIGH (ref 70–99)
Glucose-Capillary: 124 mg/dL — ABNORMAL HIGH (ref 70–99)

## 2020-03-31 LAB — COMPREHENSIVE METABOLIC PANEL
ALT: 23 U/L (ref 0–44)
AST: 35 U/L (ref 15–41)
Albumin: 3.1 g/dL — ABNORMAL LOW (ref 3.5–5.0)
Alkaline Phosphatase: 57 U/L (ref 38–126)
Anion gap: 12 (ref 5–15)
BUN: 15 mg/dL (ref 6–20)
CO2: 21 mmol/L — ABNORMAL LOW (ref 22–32)
Calcium: 8.1 mg/dL — ABNORMAL LOW (ref 8.9–10.3)
Chloride: 84 mmol/L — ABNORMAL LOW (ref 98–111)
Creatinine, Ser: 1.33 mg/dL — ABNORMAL HIGH (ref 0.61–1.24)
GFR calc Af Amer: >60 mL/min (ref >60)
GFR calc non Af Amer: 58 mL/min — ABNORMAL LOW (ref >60)
Glucose, Bld: 141 mg/dL — ABNORMAL HIGH (ref 70–99)
Potassium: 4.1 mmol/L (ref 3.5–5.1)
Sodium: 117 mmol/L — CL (ref 135–145)
Total Bilirubin: 0.9 mg/dL (ref 0.3–1.2)
Total Protein: 7.1 g/dL (ref 6.5–8.1)

## 2020-03-31 LAB — CBC
HCT: 32.8 % — ABNORMAL LOW (ref 39.0–52.0)
HCT: 32.5 % — ABNORMAL LOW (ref 39.0–52.0)
Hemoglobin: 11.5 g/dL — ABNORMAL LOW (ref 13.0–17.0)
Hemoglobin: 11.5 g/dL — ABNORMAL LOW (ref 13.0–17.0)
MCH: 31 pg (ref 26.0–34.0)
MCH: 30.8 pg (ref 26.0–34.0)
MCHC: 35.1 g/dL (ref 30.0–36.0)
MCHC: 35.4 g/dL (ref 30.0–36.0)
MCV: 88.4 fL (ref 80.0–100.0)
MCV: 87.1 fL (ref 80.0–100.0)
Platelets: 197 10*3/uL (ref 150–400)
Platelets: 186 10*3/uL (ref 150–400)
RBC: 3.71 MIL/uL — ABNORMAL LOW (ref 4.22–5.81)
RBC: 3.73 MIL/uL — ABNORMAL LOW (ref 4.22–5.81)
RDW: 13.3 % (ref 11.5–15.5)
RDW: 13.3 % (ref 11.5–15.5)
WBC: 3.8 10*3/uL — ABNORMAL LOW (ref 4.0–10.5)
WBC: 4 10*3/uL (ref 4.0–10.5)
nRBC: 0 % (ref 0.0–0.2)
nRBC: 0 % (ref 0.0–0.2)

## 2020-03-31 LAB — CREATININE, SERUM
Creatinine, Ser: 1.33 mg/dL — ABNORMAL HIGH (ref 0.61–1.24)
GFR calc Af Amer: >60 mL/min (ref >60)
GFR calc non Af Amer: 58 mL/min — ABNORMAL LOW (ref >60)

## 2020-03-31 LAB — PHOSPHORUS: Phosphorus: 3.4 mg/dL (ref 2.5–4.6)

## 2020-03-31 LAB — MRSA PCR SCREENING: MRSA by PCR: NEGATIVE

## 2020-03-31 LAB — DIGOXIN LEVEL: Digoxin Level: 0.9 ng/mL (ref 0.8–2.0)

## 2020-03-31 LAB — MAGNESIUM: Magnesium: 1.5 mg/dL — ABNORMAL LOW (ref 1.7–2.4)

## 2020-03-31 LAB — CORTISOL-AM, BLOOD: Cortisol - AM: 20.1 ug/dL (ref 6.7–22.6)

## 2020-03-31 LAB — AMMONIA: Ammonia: 41 umol/L — ABNORMAL HIGH (ref 9–35)

## 2020-03-31 MED ORDER — HEPARIN SODIUM (PORCINE) 5000 UNIT/ML IJ SOLN
5000.0000 [IU] | Freq: Three times a day (TID) | INTRAMUSCULAR | Status: DC
  Administered 2020-03-31 – 2020-04-08 (×24): 5000 [IU] via SUBCUTANEOUS
  Filled 2020-03-31 (×24): qty 1

## 2020-03-31 MED ORDER — GABAPENTIN 400 MG PO CAPS: 600.0000 mg | ORAL_CAPSULE | Freq: Two times a day (BID) | ORAL | Status: DC

## 2020-03-31 MED ORDER — DEXTROSE 5 % IV SOLN: INTRAVENOUS | Status: DC

## 2020-03-31 MED ORDER — CHLORDIAZEPOXIDE HCL 5 MG PO CAPS
25.0000 mg | ORAL_CAPSULE | Freq: Three times a day (TID) | ORAL | Status: DC
  Administered 2020-03-31 – 2020-04-02 (×7): 25 mg via ORAL
  Filled 2020-03-31: qty 1
  Filled 2020-03-31: qty 5
  Filled 2020-03-31 (×4): qty 1
  Filled 2020-03-31: qty 5

## 2020-03-31 MED ORDER — LORAZEPAM 2 MG/ML IJ SOLN: 1.0000 mg | INTRAMUSCULAR | Status: DC | PRN

## 2020-03-31 MED ORDER — THIAMINE HCL 100 MG/ML IJ SOLN
100.0000 mg | Freq: Every day | INTRAMUSCULAR | Status: DC
  Administered 2020-03-31 – 2020-04-02 (×3): 100 mg via INTRAVENOUS
  Filled 2020-03-31 (×3): qty 2

## 2020-03-31 MED ORDER — INSULIN ASPART 100 UNIT/ML ~~LOC~~ SOLN
1.0000 [IU] | SUBCUTANEOUS | Status: DC
  Administered 2020-03-31: 1 [IU] via SUBCUTANEOUS
  Administered 2020-03-31 – 2020-04-01 (×5): 2 [IU] via SUBCUTANEOUS

## 2020-03-31 MED ORDER — ATORVASTATIN CALCIUM 10 MG PO TABS
20.0000 mg | ORAL_TABLET | Freq: Every day | ORAL | Status: DC
  Administered 2020-03-31 – 2020-04-08 (×9): 20 mg via ORAL
  Filled 2020-03-31 (×9): qty 2

## 2020-03-31 MED ORDER — LORAZEPAM 1 MG PO TABS
1.0000 mg | ORAL_TABLET | ORAL | Status: AC | PRN
  Administered 2020-03-31 – 2020-04-02 (×5): 1 mg via ORAL
  Filled 2020-03-31 (×5): qty 1

## 2020-03-31 MED ORDER — DEXTROSE 5 % IV BOLUS
250.0000 mL | Freq: Once | INTRAVENOUS | Status: AC
  Administered 2020-03-31: 250 mL via INTRAVENOUS

## 2020-03-31 MED ORDER — CHLORHEXIDINE GLUCONATE CLOTH 2 % EX PADS
6.0000 | MEDICATED_PAD | Freq: Every day | CUTANEOUS | Status: DC
  Administered 2020-04-01: 6 via TOPICAL

## 2020-03-31 MED ORDER — AMIODARONE HCL 200 MG PO TABS
200.0000 mg | ORAL_TABLET | Freq: Every day | ORAL | Status: DC
  Administered 2020-03-31 – 2020-04-03 (×3): 200 mg via ORAL
  Filled 2020-03-31 (×4): qty 1

## 2020-03-31 MED ORDER — HYDROCODONE-ACETAMINOPHEN 5-325 MG PO TABS
1.0000 | ORAL_TABLET | Freq: Once | ORAL | Status: AC
  Administered 2020-03-31: 1 via ORAL
  Filled 2020-03-31: qty 1

## 2020-03-31 MED ORDER — MAGNESIUM SULFATE 2 GM/50ML IV SOLN
2.0000 g | Freq: Once | INTRAVENOUS | Status: AC
  Administered 2020-03-31: 2 g via INTRAVENOUS
  Filled 2020-03-31: qty 50

## 2020-03-31 MED ORDER — FOLIC ACID 5 MG/ML IJ SOLN
1.0000 mg | Freq: Every day | INTRAMUSCULAR | Status: DC
  Administered 2020-03-31 – 2020-04-02 (×3): 1 mg via INTRAVENOUS
  Filled 2020-03-31 (×3): qty 0.2

## 2020-03-31 MED ORDER — LORAZEPAM 2 MG/ML IJ SOLN: 1.0000 mg | INTRAMUSCULAR | Status: AC | PRN

## 2020-03-31 MED ORDER — POLYETHYLENE GLYCOL 3350 17 G PO PACK: 17.0000 g | PACK | Freq: Every day | ORAL | Status: DC | PRN

## 2020-03-31 MED ORDER — GABAPENTIN 600 MG PO TABS
600.0000 mg | ORAL_TABLET | Freq: Every day | ORAL | Status: DC
  Administered 2020-03-31 – 2020-04-08 (×9): 600 mg via ORAL
  Filled 2020-03-31 (×9): qty 1

## 2020-03-31 MED ORDER — ALBUTEROL SULFATE (2.5 MG/3ML) 0.083% IN NEBU
2.5000 mg | INHALATION_SOLUTION | Freq: Four times a day (QID) | RESPIRATORY_TRACT | Status: DC | PRN
  Filled 2020-03-31: qty 3

## 2020-03-31 MED ORDER — DOCUSATE SODIUM 100 MG PO CAPS: 100.0000 mg | ORAL_CAPSULE | Freq: Two times a day (BID) | ORAL | Status: DC | PRN

## 2020-03-31 MED ORDER — ACETAMINOPHEN 325 MG PO TABS
650.0000 mg | ORAL_TABLET | ORAL | Status: DC | PRN
  Administered 2020-03-31 – 2020-04-03 (×5): 650 mg via ORAL
  Filled 2020-03-31 (×5): qty 2

## 2020-03-31 NOTE — Progress Notes (Signed)
CRITICAL VALUE ALERT  Critical Value:  Sodium 119  Date & Time Notied: 03-31-20 at 1902  Provider Notified: Warren Lacy  Orders Received/Actions taken: Continue to monitor labs and call if next sodium level is >121.
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
## 2020-03-31 NOTE — Progress Notes (Signed)
[facility] Progress Note Patient Name: Brad [name] DOB: [birth year] MRN: [id number]   Date of Service  03/31/2020  HPI/Events of Note  Pt with readily reproducible musculoskeletal anterior  chest pain, he also has a mild headache, CT head was unremarkable.  eICU Interventions  PRN Tylenol ordered.        Kerry Kass [name] 03/31/2020, 3:25 AM
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
## 2020-04-01 ENCOUNTER — Encounter (HOSPITAL_COMMUNITY): Payer: Self-pay | Admitting: Student

## 2020-04-01 LAB — BASIC METABOLIC PANEL
Anion gap: 11 (ref 5–15)
Anion gap: 9 (ref 5–15)
Anion gap: 11 (ref 5–15)
Anion gap: 9 (ref 5–15)
Anion gap: 13 (ref 5–15)
BUN: 18 mg/dL (ref 6–20)
BUN: 18 mg/dL (ref 6–20)
BUN: 17 mg/dL (ref 6–20)
BUN: 18 mg/dL (ref 6–20)
BUN: 20 mg/dL (ref 6–20)
CO2: 23 mmol/L (ref 22–32)
CO2: 24 mmol/L (ref 22–32)
CO2: 22 mmol/L (ref 22–32)
CO2: 24 mmol/L (ref 22–32)
CO2: 19 mmol/L — ABNORMAL LOW (ref 22–32)
Calcium: 8.3 mg/dL — ABNORMAL LOW (ref 8.9–10.3)
Calcium: 8.4 mg/dL — ABNORMAL LOW (ref 8.9–10.3)
Calcium: 8.3 mg/dL — ABNORMAL LOW (ref 8.9–10.3)
Calcium: 8.4 mg/dL — ABNORMAL LOW (ref 8.9–10.3)
Calcium: 8.4 mg/dL — ABNORMAL LOW (ref 8.9–10.3)
Chloride: 87 mmol/L — ABNORMAL LOW (ref 98–111)
Chloride: 89 mmol/L — ABNORMAL LOW (ref 98–111)
Chloride: 88 mmol/L — ABNORMAL LOW (ref 98–111)
Chloride: 89 mmol/L — ABNORMAL LOW (ref 98–111)
Chloride: 87 mmol/L — ABNORMAL LOW (ref 98–111)
Creatinine, Ser: 1.45 mg/dL — ABNORMAL HIGH (ref 0.61–1.24)
Creatinine, Ser: 1.36 mg/dL — ABNORMAL HIGH (ref 0.61–1.24)
Creatinine, Ser: 1.39 mg/dL — ABNORMAL HIGH (ref 0.61–1.24)
Creatinine, Ser: 1.37 mg/dL — ABNORMAL HIGH (ref 0.61–1.24)
Creatinine, Ser: 1.72 mg/dL — ABNORMAL HIGH (ref 0.61–1.24)
GFR calc Af Amer: >60 mL/min (ref >60)
GFR calc Af Amer: >60 mL/min (ref >60)
GFR calc Af Amer: >60 mL/min (ref >60)
GFR calc Af Amer: >60 mL/min (ref >60)
GFR calc Af Amer: 49 mL/min — ABNORMAL LOW (ref >60)
GFR calc non Af Amer: 52 mL/min — ABNORMAL LOW (ref >60)
GFR calc non Af Amer: 56 mL/min — ABNORMAL LOW (ref >60)
GFR calc non Af Amer: 55 mL/min — ABNORMAL LOW (ref >60)
GFR calc non Af Amer: 56 mL/min — ABNORMAL LOW (ref >60)
GFR calc non Af Amer: 42 mL/min — ABNORMAL LOW (ref >60)
Glucose, Bld: 125 mg/dL — ABNORMAL HIGH (ref 70–99)
Glucose, Bld: 116 mg/dL — ABNORMAL HIGH (ref 70–99)
Glucose, Bld: 167 mg/dL — ABNORMAL HIGH (ref 70–99)
Glucose, Bld: 139 mg/dL — ABNORMAL HIGH (ref 70–99)
Glucose, Bld: 154 mg/dL — ABNORMAL HIGH (ref 70–99)
Potassium: 4.1 mmol/L (ref 3.5–5.1)
Potassium: 4.1 mmol/L (ref 3.5–5.1)
Potassium: 4.2 mmol/L (ref 3.5–5.1)
Potassium: 4.2 mmol/L (ref 3.5–5.1)
Potassium: 4.5 mmol/L (ref 3.5–5.1)
Sodium: 121 mmol/L — ABNORMAL LOW (ref 135–145)
Sodium: 122 mmol/L — ABNORMAL LOW (ref 135–145)
Sodium: 121 mmol/L — ABNORMAL LOW (ref 135–145)
Sodium: 122 mmol/L — ABNORMAL LOW (ref 135–145)
Sodium: 119 mmol/L — CL (ref 135–145)

## 2020-04-01 LAB — GLUCOSE, CAPILLARY
Glucose-Capillary: 100 mg/dL — ABNORMAL HIGH (ref 70–99)
Glucose-Capillary: 109 mg/dL — ABNORMAL HIGH (ref 70–99)
Glucose-Capillary: 137 mg/dL — ABNORMAL HIGH (ref 70–99)
Glucose-Capillary: 151 mg/dL — ABNORMAL HIGH (ref 70–99)
Glucose-Capillary: 156 mg/dL — ABNORMAL HIGH (ref 70–99)
Glucose-Capillary: 174 mg/dL — ABNORMAL HIGH (ref 70–99)

## 2020-04-01 MED ORDER — INSULIN ASPART 100 UNIT/ML ~~LOC~~ SOLN
1.0000 [IU] | Freq: Three times a day (TID) | SUBCUTANEOUS | Status: DC
  Administered 2020-04-01 – 2020-04-02 (×2): 2 [IU] via SUBCUTANEOUS
  Administered 2020-04-02: 1 [IU] via SUBCUTANEOUS
  Administered 2020-04-03 – 2020-04-04 (×4): 2 [IU] via SUBCUTANEOUS
  Administered 2020-04-04 – 2020-04-05 (×4): 1 [IU] via SUBCUTANEOUS
  Administered 2020-04-05: 2 [IU] via SUBCUTANEOUS
  Administered 2020-04-06 – 2020-04-07 (×4): 1 [IU] via SUBCUTANEOUS
  Administered 2020-04-07: 2 [IU] via SUBCUTANEOUS
  Administered 2020-04-07 – 2020-04-08 (×2): 1 [IU] via SUBCUTANEOUS

## 2020-04-01 NOTE — Progress Notes (Signed)
CRITICAL VALUE STICKER  CRITICAL VALUE: Sodium 119  DATE & TIME NOTIFIED: 04/01/2020 2059  MESSENGER (representative from lab): Zigmund Daniel  MD NOTIFIED: X. Blount NP  TIME OF NOTIFICATION: 2059

## 2020-04-01 NOTE — Evaluation (Signed)
Physical Therapy Evaluation Patient Details Name: Brad [name] MRN: [id number] DOB: [birth year] Today's Date: 04/01/2020   History of Present Illness  59 year old male history of alcohol abuse, CVA, diabetes mellitus, HFrEF, CKD who fell at the store and was brought to the ER and found to have profound hyponatremia and was admitted to ICU 03/30/20. Patient has history of recurrent fall, recurrent admission.  He has history of chronic hyponatremia.   Clinical Impression   Pt admitted with above diagnosis. Comes from home where he lives with a roommate in a single level apartment, level entry; At baseline, walks independently, sometimes with a cane; This admission comes with frequent falls, and multiple readmissions in the past few months; Presents to PT with gait unsteadiness, history of falls, and continuing incr risk of falls;  Pt currently with functional limitations due to the deficits listed below (see PT Problem List). Pt will benefit from skilled PT to increase their independence and safety with mobility to allow discharge to the venue listed below.       Follow Up Recommendations Other (comment);Home health PT;Supervision - Intermittent(Substance Abuse Rehab would be great if it is available to him, and he chooses to go)    Equipment Recommendations  None recommended by PT    Recommendations for Other Services       Precautions / Restrictions Precautions Precautions: Fall      Mobility  Bed Mobility Overal bed mobility: Modified Independent             General bed mobility comments: use of bed rail  Transfers Overall transfer level: Needs assistance Equipment used: None Transfers: Sit to/from Stand Sit to Stand: Min guard         General transfer comment: cues to self-monitor for activity tolerance  Ambulation/Gait Ambulation/Gait assistance: Min guard;Min assist Gait Distance (Feet): 150 Feet Assistive device: Straight cane Gait Pattern/deviations:  Step-through pattern     General Gait Details: Unsteady gait with erratic step width, occasional min assist to steady due to small losses of balance  Stairs            Wheelchair Mobility    Modified [name] (Stroke Patients Only)       Balance                                             Pertinent Vitals/Pain Pain Assessment: 0-10 Pain Score: 8  Pain Location: headache Pain Descriptors / Indicators: Headache Pain Intervention(s): Monitored during session    Home Living Family/patient expects to be discharged to:: Private residence Living Arrangements: Other (Comment)(roomate) Available Help at Discharge: Friend(s) Type of Home: Apartment Home Access: (no stairs )     Home Layout: One level Home Equipment: Walker - 2 wheels;Cane - single point      Prior Function Level of Independence: Independent         Comments: pt reports occasionally requiring cane for ambulation, but otherwise is independent     Hand Dominance   Dominant Hand: Right    Extremity/Trunk Assessment   Upper Extremity Assessment Upper Extremity Assessment: Defer to OT evaluation    Lower Extremity Assessment Lower Extremity Assessment: Generalized weakness    Cervical / Trunk Assessment Cervical / Trunk Assessment: Normal  Communication   Communication: No difficulties  Cognition Arousal/Alertness: Awake/alert Behavior During Therapy: WFL for tasks assessed/performed Overall Cognitive Status: No family/caregiver present to determine  baseline cognitive functioning Area of Impairment: Safety/judgement                         Safety/Judgement: Decreased awareness of safety;Decreased awareness of deficits            General Comments General comments (skin integrity, edema, etc.): VSS on room air; HR range 54-65    Exercises     Assessment/Plan    PT Assessment Patient needs continued PT services  PT Problem List Decreased  strength;Decreased activity tolerance;Decreased balance;Decreased mobility;Decreased coordination;Decreased knowledge of use of DME;Decreased safety awareness       PT Treatment Interventions DME instruction;Gait training;Functional mobility training;Therapeutic activities;Therapeutic exercise;Balance training;Neuromuscular re-education;Patient/family education    PT Goals (Current goals can be found in the Care Plan section)  Acute Rehab PT Goals Patient Stated Goal: did not voice PT Goal Formulation: With patient Time For Goal Achievement: 04/07/20 Potential to Achieve Goals: Fair Additional Goals Additional Goal #1: pt will score greater than or equal to 49/56 on the [place] Balance Assessment    Frequency Min 3X/week   Barriers to discharge   It sounds like his sister has concerns about pt's safety in current situation    Co-evaluation               AM-PAC PT "6 Clicks" Mobility  Outcome Measure Help needed turning from your back to your side while in a flat bed without using bedrails?: None Help needed moving from lying on your back to sitting on the side of a flat bed without using bedrails?: None Help needed moving to and from a bed to a chair (including a wheelchair)?: A Little Help needed standing up from a chair using your arms (e.g., wheelchair or bedside chair)?: A Little Help needed to walk in hospital room?: A Little Help needed climbing 3-5 steps with a railing? : A Lot 6 Click Score: 19    End of Session Equipment Utilized During Treatment: Gait belt Activity Tolerance: Patient tolerated treatment well Patient left: in bed;with call bell/phone within reach;with bed alarm set Nurse Communication: Mobility status PT Visit Diagnosis: Other abnormalities of gait and mobility (R26.89);Muscle weakness (generalized) (M62.81)    Time: [id number] PT Time Calculation (min) (ACUTE ONLY): 19 min   Charges:   PT Evaluation $PT Eval Moderate Complexity: 1 Mod           Roney [place], [place]  Acute Rehabilitation Services Pager [phone] Office [phone]   [place] 04/01/2020, 3:56 PM

## 2020-04-01 NOTE — Progress Notes (Signed)
PROGRESS NOTE    Brad [name]  [id number] DOB: [birth year] DOA: 03/30/2020 PCP: Charlott Rakes, MD   Brief Narrative: 58 year old male history of alcohol abuse, CVA, diabetes mellitus, HFrEF, CKD who fell at the store and was brought to the ER and found to have profound hyponatremia and was admitted to ICU 03/30/20. Patient has history of recurrent fall, recurrent admission.  He has history of chronic hyponatremia.  On initial evaluation urine sodium 38 after normal saline, urine osmole low serum osmole 270, CT head and C-spine without acute finding.  Patient was altered and confused on presentation.  His torsemide was recently decreased after last admission for his hyponatremia was thought to be due to volume depletion, he has noted worsening lower leg edema since then but no chest pain.  He unfortunately continues to drink alcohol and was drinking whole week without adequate oral intake PTA. Patient sodium overcorrected and was placed on hypotonic fluid, and was monitored serially.  Patient appeared to be shaky and felt of alcohol withdrawal and placed on Librium taper.  Patient was transferred out of ICU 03/28/2020  Subjective: Seen this morning is alert awake oriented no tremors.  No new complaints.   Assessment & Plan:  Hypotonic hyponatremia acute on chronic, suspected beer potomania,  he has been drinking and not eating for the past week prior to admission.  No evidence of fluid overload, baseline sodium 125-130.  TSH three-point 3 AM cortisol normal.  After Fluids in the ED sodium corrected 111->117-patient placed on D5 with goal of sodium 124-125 by 4/11 5 pm.  Patient has been off IV fluids.  At this time will let the sodium corrected slowly, not on IV fluids.  Allow oral intake. Recent Labs  Lab 03/31/20 1350 03/31/20 1821 03/31/20 2240 04/01/20 0208 04/01/20 0633  NA 118* 119* 120* 121* 122*   Chronic systolic CHF/PAF:TTE from 07/2019 shows EF 25%, LV diffuse hypokinesis,  mod-severe MR, moderate TR, RA and LA dilation, moderate RV dysfunction, and trivial pericardial effusion. D.c wt last wk at 184 lb. Wt at 180 lb. home Aldactone and torsemide on hold, remains on BiDil, Lipitor and amiodarone.  Followed by Dr. Debara Pickett and Dr. Algernon Huxley and has not been seen in a while.  Acute metabolic encephalopathy, presents with confusion in the setting of alcohol intoxication acute on chronic hyponatremia: On Librium for alcohol withdrawal, on supportive measures.  Alcohol abuse with signs of withdrawal: EtOH level 120 on admission.  Patient is placed on 25 mg Librium 3 times daily and CIWA protocol, thiamine folate vitamins.  Liver cirrhosis due to chronic hep C and alcoholic liver disease: Aldactone and torsemide on hold due to hyponatremia.  CKD stage IIIa Baseline creatinine1.2-1.3.  T2DM last A1c 6.2 in March, diet controlled, monitor.  Recurrent falls continue PT/OT.  DVT prophylaxis:Heparin. Code Status:full. Family Communication:plan of care discussed with patient at bedside.  Status is: Inpatient Remains inpatient appropriate because:Persistent severe electrolyte disturbances  Dispo: The patient is from: Home              Anticipated d/c is to: TBD, after pt/ot eval              Anticipated d/c date is: 2 days              Patient currently is not medically stable to d/c. Nutrition: Diet Order            Diet heart healthy/carb modified Room service appropriate? Yes; Fluid consistency: Thin  Diet effective now  Consultants:pccm Procedures:see note Microbiology:see note  Medications: Scheduled Meds: . amiodarone  200 mg Oral Daily  . atorvastatin  20 mg Oral Daily  . chlordiazePOXIDE  25 mg Oral TID  . Chlorhexidine Gluconate Cloth  6 each Topical Daily  . folic acid  1 mg Intravenous Daily  . gabapentin  600 mg Oral Daily  . heparin  5,000 Units Subcutaneous Q8H  . insulin aspart  1-3 Units Subcutaneous Q4H  . thiamine injection  100  mg Intravenous Daily   Continuous Infusions:  Antimicrobials: Anti-infectives (From admission, onward)   None       Objective: Vitals: Today's Vitals   04/01/20 0019 04/01/20 0020 04/01/20 0451 04/01/20 0500  BP:  120/64    Pulse:  (!) 54    Resp:  (!) 22    Temp: 97.8 F (36.6 C)  98.1 F (36.7 C)   TempSrc: Oral  Oral   SpO2:  100%    Weight:    81.9 kg  PainSc:        Intake/Output Summary (Last 24 hours) at 04/01/2020 0744 Last data filed at 04/01/2020 0700 Gross per 24 hour  Intake --  Output 1775 ml  Net -1775 ml   Filed Weights   03/31/20 0304 04/01/20 0500  Weight: 83.1 kg 81.9 kg   Weight change: -1.2 kg   Intake/Output from previous day: 04/10 0701 - 04/11 0700 In: -  Out: [id number] [Urine:1775] Intake/Output this shift: No intake/output data recorded.  Examination: General exam: AAOx3,old for age,NAD, weak appearing. HEENT:Oral mucosa moist, Ear/Nose WNL grossly,dentition normal. Respiratory system: bilaterally clear,no use of accessory muscle, non tender. Cardiovascular system: S1 & S2 +, regular, No JVD. Gastrointestinal system: Abdomen soft, NT,ND, BS+. Nervous System:Alert, awake, moving extremities and grossly nonfocal. Extremities: No edema, distal peripheral pulses palpable.  Skin: No rashes,no icterus. MSK: Normal muscle bulk,tone,power.  Data Reviewed:I have personally reviewed following labs and imaging studies.  CBC: Recent Labs  Lab 03/26/20 0302 03/30/20 1746 03/31/20 0238 03/31/20 0600  WBC 4.1 3.4* 3.8* 4.0  NEUTROABS 3.1  --   --   --   HGB 11.4* 12.0* 11.5* 11.5*  HCT 33.7* 34.1* 32.8* 32.5*  MCV 93.6 88.8 88.4 87.1  PLT 158 210 197 [id number]   Basic Metabolic Panel: Recent Labs  Lab 03/26/20 0302 03/30/20 1746 03/31/20 0600 03/31/20 0600 03/31/20 1028 03/31/20 1350 03/31/20 1821 03/31/20 2240 04/01/20 0208  NA 124*   < > 120*   < > 119* 118* 119* 120* 121*  K 4.4   < > 4.2   < > 4.2 4.0 4.2 4.1 4.1  CL 90*   < >  85*   < > 86* 85* 85* 86* 87*  CO2 23   < > 23   < > 22 22 23 23 23   GLUCOSE 143*   < > 158*   < > 149* 162* 146* 133* 125*  BUN 18   < > 16   < > 18 18 18 20 18   CREATININE 1.61*   < > 1.35*   < > 1.28* 1.34* 1.39* 1.51* 1.45*  CALCIUM 8.7*   < > 8.3*   < > 8.2* 8.1* 8.3* 8.1* 8.3*  MG 1.7  --  1.5*  --   --   --   --   --   --   PHOS 3.3  --  3.4  --   --   --   --   --   --    < > =  values in this interval not displayed.   GFR: Estimated Creatinine Clearance: 57.7 mL/min (A) (by C-G formula based on SCr of 1.45 mg/dL (H)). Liver Function Tests: Recent Labs  Lab 03/31/20 0142  AST 35  ALT 23  ALKPHOS 57  BILITOT 0.9  PROT 7.1  ALBUMIN 3.1*   No results for input(s): LIPASE, AMYLASE in the last 168 hours. Recent Labs  Lab 03/31/20 0600  AMMONIA 41*   Coagulation Profile: No results for input(s): INR, PROTIME in the last 168 hours. Cardiac Enzymes: No results for input(s): CKTOTAL, CKMB, CKMBINDEX, TROPONINI in the last 168 hours. BNP (last 3 results) No results for input(s): PROBNP in the last 8760 hours. HbA1C: No results for input(s): HGBA1C in the last 72 hours. CBG: Recent Labs  Lab 03/31/20 1131 03/31/20 1551 03/31/20 2027 04/01/20 0017 04/01/20 0448  GLUCAP 156* 151* 124* 109* 156*   Lipid Profile: No results for input(s): CHOL, HDL, LDLCALC, TRIG, CHOLHDL, LDLDIRECT in the last 72 hours. Thyroid Function Tests: No results for input(s): TSH, T4TOTAL, FREET4, T3FREE, THYROIDAB in the last 72 hours. Anemia Panel: No results for input(s): VITAMINB12, FOLATE, FERRITIN, TIBC, IRON, RETICCTPCT in the last 72 hours. Sepsis Labs: No results for input(s): PROCALCITON, LATICACIDVEN in the last 168 hours.  Recent Results (from the past 240 hour(s))  SARS CORONAVIRUS 2 (TAT 6-24 HRS) Nasopharyngeal Nasopharyngeal Swab     Status: None   Collection Time: 03/23/20  2:39 PM   Specimen: Nasopharyngeal Swab  Result Value Ref Range Status   SARS Coronavirus 2  NEGATIVE NEGATIVE Final    Comment: (NOTE) SARS-CoV-2 target nucleic acids are NOT DETECTED. The SARS-CoV-2 RNA is generally detectable in upper and lower respiratory specimens during the acute phase of infection. Negative results do not preclude SARS-CoV-2 infection, do not rule out co-infections with other pathogens, and should not be used as the sole basis for treatment or other patient management decisions. Negative results must be combined with clinical observations, patient history, and epidemiological information. The expected result is Negative. Fact Sheet for Patients: SugarRoll.be Fact Sheet for Healthcare Providers: https://www.woods-mathews.com/ This test is not yet approved or cleared by the [place] FDA and  has been authorized for detection and/or diagnosis of SARS-CoV-2 by FDA under an Emergency Use Authorization (EUA). This EUA will remain  in effect (meaning this test can be used) for the duration of the COVID-19 declaration under Section 56 4(b)(1) of the Act, 21 U.S.C. section 360bbb-3(b)(1), unless the authorization is terminated or revoked sooner. Performed at [facility], [place] [address], [place], [place] [address]   Respiratory Panel by RT PCR (Flu A&B, Covid) - Nasopharyngeal Swab     Status: None   Collection Time: 03/30/20 10:44 PM   Specimen: Nasopharyngeal Swab  Result Value Ref Range Status   SARS Coronavirus 2 by RT PCR NEGATIVE NEGATIVE Final    Comment: (NOTE) SARS-CoV-2 target nucleic acids are NOT DETECTED. The SARS-CoV-2 RNA is generally detectable in upper respiratoy specimens during the acute phase of infection. The lowest concentration of SARS-CoV-2 viral copies this assay can detect is 131 copies/mL. A negative result does not preclude SARS-Cov-2 infection and should not be used as the sole basis for treatment or other patient management decisions. A negative result may occur with    improper specimen collection/handling, submission of specimen other than nasopharyngeal swab, presence of viral mutation(s) within the areas targeted by this assay, and inadequate number of viral copies (<131 copies/mL). A negative result must be  combined with clinical observations, patient history, and epidemiological information. The expected result is Negative. Fact Sheet for Patients:  PinkCheek.be Fact Sheet for Healthcare Providers:  GravelBags.it This test is not yet ap proved or cleared by the [place] FDA and  has been authorized for detection and/or diagnosis of SARS-CoV-2 by FDA under an Emergency Use Authorization (EUA). This EUA will remain  in effect (meaning this test can be used) for the duration of the COVID-19 declaration under Section 564(b)(1) of the Act, 21 U.S.C. section 360bbb-3(b)(1), unless the authorization is terminated or revoked sooner.    Influenza A by PCR NEGATIVE NEGATIVE Final   Influenza B by PCR NEGATIVE NEGATIVE Final    Comment: (NOTE) The Xpert Xpress SARS-CoV-2/FLU/RSV assay is intended as an aid in  the diagnosis of influenza from Nasopharyngeal swab specimens and  should not be used as a sole basis for treatment. Nasal washings and  aspirates are unacceptable for Xpert Xpress SARS-CoV-2/FLU/RSV  testing. Fact Sheet for Patients: PinkCheek.be Fact Sheet for Healthcare Providers: GravelBags.it This test is not yet approved or cleared by the [place] FDA and  has been authorized for detection and/or diagnosis of SARS-CoV-2 by  FDA under an Emergency Use Authorization (EUA). This EUA will remain  in effect (meaning this test can be used) for the duration of the  Covid-19 declaration under Section 564(b)(1) of the Act, 21  U.S.C. section 360bbb-3(b)(1), unless the authorization is  terminated or revoked. Performed at  [facility], [place] [address] [place], [place] [address]   MRSA PCR Screening     Status: None   Collection Time: 03/31/20  3:00 AM   Specimen: Nasal Mucosa; Nasopharyngeal  Result Value Ref Range Status   MRSA by PCR NEGATIVE NEGATIVE Final    Comment:        The GeneXpert MRSA Assay (FDA approved for NASAL specimens only), is one component of a comprehensive MRSA colonization surveillance program. It is not intended to diagnose MRSA infection nor to guide or monitor treatment for MRSA infections. Performed at [facility], [place] [address], [place], [place] [address]       Radiology Studies: CT Head Wo Contrast  Result Date: 03/30/2020 CLINICAL DATA:  Syncope, fall, posterior headache, neck pain. EXAM: CT HEAD WITHOUT CONTRAST CT CERVICAL SPINE WITHOUT CONTRAST TECHNIQUE: Multidetector CT imaging of the head and cervical spine was performed following the standard protocol without intravenous contrast. Multiplanar CT image reconstructions of the cervical spine were also generated. COMPARISON:  03/16/2020 FINDINGS: CT HEAD FINDINGS Brain: No evidence of acute infarction, hemorrhage, hydrocephalus, extra-axial collection or mass lesion/mass effect. Subcortical white matter and periventricular small vessel ischemic changes. Vascular: Intracranial atherosclerosis. Skull: Normal. Negative for fracture or focal lesion. Sinuses/Orbits: The visualized paranasal sinuses are essentially clear. The mastoid air cells are unopacified. Other: None. CT CERVICAL SPINE FINDINGS Alignment: Reversal of the normal mid cervical lordosis. Skull base and vertebrae: No acute fracture. No primary bone lesion or focal pathologic process. Soft tissues and spinal canal: No prevertebral fluid or swelling. No visible canal hematoma. Disc levels: Mild degenerative changes of the mid cervical spine. Spinal canal is patent. Upper chest: Visualized lung apices are clear. Other: Visualized thyroid is unremarkable.  IMPRESSION: No evidence of acute intracranial abnormality. Small vessel ischemic changes. No evidence of traumatic injury to the cervical spine. Mild degenerative changes of the mid cervical spine. Electronically Signed   By: Julian Hy M.D.   On: 03/30/2020 20:42   CT  Cervical Spine Wo Contrast  Result Date: 03/30/2020 CLINICAL DATA:  Syncope, fall, posterior headache, neck pain. EXAM: CT HEAD WITHOUT CONTRAST CT CERVICAL SPINE WITHOUT CONTRAST TECHNIQUE: Multidetector CT imaging of the head and cervical spine was performed following the standard protocol without intravenous contrast. Multiplanar CT image reconstructions of the cervical spine were also generated. COMPARISON:  03/16/2020 FINDINGS: CT HEAD FINDINGS Brain: No evidence of acute infarction, hemorrhage, hydrocephalus, extra-axial collection or mass lesion/mass effect. Subcortical white matter and periventricular small vessel ischemic changes. Vascular: Intracranial atherosclerosis. Skull: Normal. Negative for fracture or focal lesion. Sinuses/Orbits: The visualized paranasal sinuses are essentially clear. The mastoid air cells are unopacified. Other: None. CT CERVICAL SPINE FINDINGS Alignment: Reversal of the normal mid cervical lordosis. Skull base and vertebrae: No acute fracture. No primary bone lesion or focal pathologic process. Soft tissues and spinal canal: No prevertebral fluid or swelling. No visible canal hematoma. Disc levels: Mild degenerative changes of the mid cervical spine. Spinal canal is patent. Upper chest: Visualized lung apices are clear. Other: Visualized thyroid is unremarkable. IMPRESSION: No evidence of acute intracranial abnormality. Small vessel ischemic changes. No evidence of traumatic injury to the cervical spine. Mild degenerative changes of the mid cervical spine. Electronically Signed   By: Julian Hy M.D.   On: 03/30/2020 20:42     LOS: 1 day   Time spent: More than 50% of that time was spent in  counseling and/or coordination of care.  Antonieta Pert, MD [facility]  04/01/2020, 7:44 AM

## 2020-04-02 ENCOUNTER — Inpatient Hospital Stay: Payer: Medicaid Other | Admitting: Hematology

## 2020-04-02 ENCOUNTER — Inpatient Hospital Stay (HOSPITAL_COMMUNITY): Payer: Medicaid Other

## 2020-04-02 LAB — BASIC METABOLIC PANEL
Anion gap: 11 (ref 5–15)
Anion gap: 8 (ref 5–15)
Anion gap: 8 (ref 5–15)
Anion gap: 9 (ref 5–15)
BUN: 18 mg/dL (ref 6–20)
BUN: 19 mg/dL (ref 6–20)
BUN: 19 mg/dL (ref 6–20)
BUN: 20 mg/dL (ref 6–20)
CO2: 23 mmol/L (ref 22–32)
CO2: 24 mmol/L (ref 22–32)
CO2: 25 mmol/L (ref 22–32)
CO2: 26 mmol/L (ref 22–32)
Calcium: 8.3 mg/dL — ABNORMAL LOW (ref 8.9–10.3)
Calcium: 8.4 mg/dL — ABNORMAL LOW (ref 8.9–10.3)
Calcium: 8.5 mg/dL — ABNORMAL LOW (ref 8.9–10.3)
Calcium: 8.5 mg/dL — ABNORMAL LOW (ref 8.9–10.3)
Chloride: 86 mmol/L — ABNORMAL LOW (ref 98–111)
Chloride: 91 mmol/L — ABNORMAL LOW (ref 98–111)
Chloride: 91 mmol/L — ABNORMAL LOW (ref 98–111)
Chloride: 91 mmol/L — ABNORMAL LOW (ref 98–111)
Creatinine, Ser: 1.47 mg/dL — ABNORMAL HIGH (ref 0.61–1.24)
Creatinine, Ser: 1.48 mg/dL — ABNORMAL HIGH (ref 0.61–1.24)
Creatinine, Ser: 1.53 mg/dL — ABNORMAL HIGH (ref 0.61–1.24)
Creatinine, Ser: 1.68 mg/dL — ABNORMAL HIGH (ref 0.61–1.24)
GFR calc Af Amer: 50 mL/min — ABNORMAL LOW (ref >60)
GFR calc Af Amer: 56 mL/min — ABNORMAL LOW (ref >60)
GFR calc Af Amer: 59 mL/min — ABNORMAL LOW (ref >60)
GFR calc Af Amer: 59 mL/min — ABNORMAL LOW (ref >60)
GFR calc non Af Amer: 43 mL/min — ABNORMAL LOW (ref >60)
GFR calc non Af Amer: 49 mL/min — ABNORMAL LOW (ref >60)
GFR calc non Af Amer: 51 mL/min — ABNORMAL LOW (ref >60)
GFR calc non Af Amer: 51 mL/min — ABNORMAL LOW (ref >60)
Glucose, Bld: 126 mg/dL — ABNORMAL HIGH (ref 70–99)
Glucose, Bld: 146 mg/dL — ABNORMAL HIGH (ref 70–99)
Glucose, Bld: 157 mg/dL — ABNORMAL HIGH (ref 70–99)
Glucose, Bld: 172 mg/dL — ABNORMAL HIGH (ref 70–99)
Potassium: 4.1 mmol/L (ref 3.5–5.1)
Potassium: 4.2 mmol/L (ref 3.5–5.1)
Potassium: 4.3 mmol/L (ref 3.5–5.1)
Potassium: 4.4 mmol/L (ref 3.5–5.1)
Sodium: 120 mmol/L — ABNORMAL LOW (ref 135–145)
Sodium: 124 mmol/L — ABNORMAL LOW (ref 135–145)
Sodium: 124 mmol/L — ABNORMAL LOW (ref 135–145)
Sodium: 125 mmol/L — ABNORMAL LOW (ref 135–145)

## 2020-04-02 LAB — GLUCOSE, CAPILLARY
Glucose-Capillary: 153 mg/dL — ABNORMAL HIGH (ref 70–99)
Glucose-Capillary: 167 mg/dL — ABNORMAL HIGH (ref 70–99)
Glucose-Capillary: 149 mg/dL — ABNORMAL HIGH (ref 70–99)
Glucose-Capillary: 149 mg/dL — ABNORMAL HIGH (ref 70–99)

## 2020-04-02 LAB — TROPONIN I (HIGH SENSITIVITY)
Troponin I (High Sensitivity): 32 ng/L — ABNORMAL HIGH (ref <18)
Troponin I (High Sensitivity): 33 ng/L — ABNORMAL HIGH (ref <18)

## 2020-04-02 MED ORDER — SODIUM CHLORIDE 0.9 % IV SOLN: INTRAVENOUS | Status: DC

## 2020-04-02 MED ORDER — THIAMINE HCL 100 MG PO TABS
100.0000 mg | ORAL_TABLET | Freq: Every day | ORAL | Status: DC
  Administered 2020-04-03 – 2020-04-08 (×6): 100 mg via ORAL
  Filled 2020-04-02 (×6): qty 1

## 2020-04-02 MED ORDER — ISOSORB DINITRATE-HYDRALAZINE 20-37.5 MG PO TABS
1.0000 | ORAL_TABLET | Freq: Two times a day (BID) | ORAL | Status: DC
  Administered 2020-04-02 – 2020-04-07 (×10): 1 via ORAL
  Filled 2020-04-02 (×10): qty 1

## 2020-04-02 MED ORDER — FOLIC ACID 1 MG PO TABS
1.0000 mg | ORAL_TABLET | Freq: Every day | ORAL | Status: DC
  Administered 2020-04-03 – 2020-04-08 (×6): 1 mg via ORAL
  Filled 2020-04-02 (×6): qty 1

## 2020-04-02 MED ORDER — DULOXETINE HCL 60 MG PO CPEP
60.0000 mg | ORAL_CAPSULE | Freq: Every day | ORAL | Status: DC
  Administered 2020-04-02 – 2020-04-08 (×7): 60 mg via ORAL
  Filled 2020-04-02 (×7): qty 1

## 2020-04-02 MED ORDER — NITROGLYCERIN 0.4 MG SL SUBL
0.4000 mg | SUBLINGUAL_TABLET | SUBLINGUAL | Status: DC | PRN
  Administered 2020-04-02: 0.4 mg via SUBLINGUAL
  Filled 2020-04-02: qty 1

## 2020-04-02 MED ORDER — AMITRIPTYLINE HCL 10 MG PO TABS
20.0000 mg | ORAL_TABLET | Freq: Every day | ORAL | Status: DC
  Administered 2020-04-02 – 2020-04-07 (×6): 20 mg via ORAL
  Filled 2020-04-02 (×7): qty 2

## 2020-04-02 MED ORDER — MIRTAZAPINE 15 MG PO TABS
15.0000 mg | ORAL_TABLET | Freq: Every day | ORAL | Status: DC
  Administered 2020-04-02 – 2020-04-07 (×6): 15 mg via ORAL
  Filled 2020-04-02 (×6): qty 1

## 2020-04-02 MED ORDER — CHLORDIAZEPOXIDE HCL 5 MG PO CAPS
10.0000 mg | ORAL_CAPSULE | Freq: Three times a day (TID) | ORAL | Status: DC
  Administered 2020-04-02 – 2020-04-03 (×3): 10 mg via ORAL
  Filled 2020-04-02 (×4): qty 2

## 2020-04-02 MED ORDER — SODIUM CHLORIDE 1 G PO TABS
1.0000 g | ORAL_TABLET | Freq: Three times a day (TID) | ORAL | Status: DC
  Filled 2020-04-02 (×3): qty 1

## 2020-04-02 NOTE — Progress Notes (Addendum)
Pt c/o cheat pain 8-10 pain with pressure MD was notified will cont to monitor. MD order EKG and Nitroglycerin tabs and recheck V/S. Pt is comfortable at this time on 1L [place] will cont you to monitor.

## 2020-04-02 NOTE — Evaluation (Signed)
Occupational Therapy Evaluation Patient Details Name: Brad [name] MRN: [id number] DOB: 1961/09/29 Today's Date: 04/02/2020    History of Present Illness 59 year old male history of alcohol abuse, CVA, diabetes mellitus, HFrEF, CKD who fell at the store and was brought to the ER and found to have profound hyponatremia and was admitted to ICU 03/30/20. Patient has history of recurrent fall, recurrent admission.  He has history of chronic hyponatremia.    Clinical Impression   Pt was functioning modified independently prior to admission using a cane. He presents with fatigue having slept poorly and headache. Pt demonstrates unsteadiness with OOB mobility, requiring min guard to min assist, impeding independence in ADL. Pt requesting tub seat for home, agree pt need DME for safety and fall prevention. Will follow acutely.    Follow Up Recommendations  Home health OT    Equipment Recommendations  Tub/shower seat    Recommendations for Other Services       Precautions / Restrictions Precautions Precautions: Fall Restrictions Weight Bearing Restrictions: No      Mobility Bed Mobility Overal bed mobility: Modified Independent             General bed mobility comments: HOB flat  Transfers Overall transfer level: Needs assistance Equipment used: Straight cane Transfers: Sit to/from Stand Sit to Stand: Min guard              Balance                                           ADL either performed or assessed with clinical judgement   ADL Overall ADL's : Needs assistance/impaired Eating/Feeding: Independent   Grooming: Set up;Standing;Wash/dry hands   Upper Body Bathing: Set up;Sitting   Lower Body Bathing: Min guard;Sit to/from stand   Upper Body Dressing : Sitting;Set up   Lower Body Dressing: Min guard;Sit to/from stand   Toilet Transfer: Minimal assistance;Ambulation   Toileting- Clothing Manipulation and Hygiene: Minimal  assistance;Sit to/from stand   Tub/ Shower Transfer: Minimal assistance;Ambulation   Functional mobility during ADLs: Minimal assistance;Cane       Vision Baseline Vision/History: Wears glasses Wears Glasses: Reading only       Perception     Praxis      Pertinent Vitals/Pain Pain Assessment: Faces Faces Pain Scale: Hurts whole lot Pain Location: headache Pain Descriptors / Indicators: Headache Pain Intervention(s): Monitored during session     Hand Dominance Right   Extremity/Trunk Assessment Upper Extremity Assessment Upper Extremity Assessment: Overall WFL for tasks assessed   Lower Extremity Assessment Lower Extremity Assessment: Defer to PT evaluation   Cervical / Trunk Assessment Cervical / Trunk Assessment: Normal   Communication Communication Communication: No difficulties   Cognition Arousal/Alertness: Awake/alert Behavior During Therapy: WFL for tasks assessed/performed Overall Cognitive Status: No family/caregiver present to determine baseline cognitive functioning Area of Impairment: Safety/judgement                         Safety/Judgement: Decreased awareness of safety;Decreased awareness of deficits         General Comments       Exercises     Shoulder Instructions      Home Living Family/patient expects to be discharged to:: Private residence Living Arrangements: Non-relatives/Friends(roommate) Available Help at Discharge: Friend(s) Type of Home: Apartment Home Access: Level entry     Home Layout: One level  Bathroom Shower/Tub: Teacher, early years/pre: Standard     Home Equipment: Environmental consultant - 2 wheels;Cane - single point          Prior Functioning/Environment Level of Independence: Independent        Comments: pt reports occasionally requiring cane for ambulation, but otherwise is independent        OT Problem List: Decreased activity tolerance;Impaired balance (sitting and/or  standing);Decreased knowledge of use of DME or AE;Decreased safety awareness      OT Treatment/Interventions: Self-care/ADL training;DME and/or AE instruction;Patient/family education;Balance training    OT Goals(Current goals can be found in the care plan section) Acute Rehab OT Goals Patient Stated Goal: to sleep OT Goal Formulation: With patient Time For Goal Achievement: 04/16/20 Potential to Achieve Goals: Good ADL Goals Pt Will Perform Grooming: with modified independence;standing Pt Will Perform Lower Body Bathing: with modified independence;sit to/from stand Pt Will Perform Lower Body Dressing: with modified independence;sit to/from stand Pt Will Transfer to Toilet: with modified independence;ambulating Pt Will Perform Toileting - Clothing Manipulation and hygiene: with modified independence;sit to/from stand Additional ADL Goal #1: Pt will gather items necessary for ADL around his room modified independently.  OT Frequency: Min 2X/week   Barriers to D/C:            Co-evaluation              AM-PAC OT "6 Clicks" Daily Activity     Outcome Measure Help from another person eating meals?: None Help from another person taking care of personal grooming?: A Little Help from another person toileting, which includes using toliet, bedpan, or urinal?: A Little Help from another person bathing (including washing, rinsing, drying)?: A Little Help from another person to put on and taking off regular upper body clothing?: None Help from another person to put on and taking off regular lower body clothing?: A Little 6 Click Score: 20   End of Session Equipment Utilized During Treatment: Gait belt  Activity Tolerance: Patient limited by pain;Patient limited by fatigue Patient left: in bed;with bed alarm set;with call bell/phone within reach  OT Visit Diagnosis: Unsteadiness on feet (R26.81);Other abnormalities of gait and mobility (R26.89)                Time: [id number] OT  Time Calculation (min): 15 min Charges:  OT General Charges $OT Visit: 1 Visit OT Evaluation $OT Eval Moderate Complexity: 1 Mod  Nestor Lewandowsky, OTR/L Acute Rehabilitation Services Pager: [phone] Office: [phone]  Brad [name] 04/02/2020, 9:43 AM

## 2020-04-02 NOTE — Plan of Care (Signed)
  Problem: Education: Goal: Knowledge of General Education information will improve Description: Including pain rating scale, medication(s)/side effects and non-pharmacologic comfort measures Outcome: Progressing   Problem: Clinical Measurements: Goal: Will remain free from infection Outcome: Progressing   Problem: Nutrition: Goal: Adequate nutrition will be maintained Outcome: Progressing   Problem: Coping: Goal: Level of anxiety will decrease Outcome: Progressing   Problem: Safety: Goal: Ability to remain free from injury will improve Outcome: Progressing   Problem: Skin Integrity: Goal: Risk for impaired skin integrity will decrease Outcome: Progressing   Problem: Clinical Measurements: Goal: Ability to maintain clinical measurements within normal limits will improve Outcome: Not Progressing Goal: Diagnostic test results will improve Outcome: Not Progressing

## 2020-04-02 NOTE — Plan of Care (Signed)

## 2020-04-02 NOTE — Progress Notes (Signed)
PROGRESS NOTE    Brad [name]  [id number] DOB: 08-Jul-1961 DOA: 03/30/2020 PCP: Charlott Rakes, MD   Brief Narrative: 59 year old male history of alcohol abuse, CVA, diabetes mellitus, HFrEF, CKD who fell at the store and was brought to the ER and found to have profound hyponatremia and was admitted to ICU 03/30/20. Patient has history of recurrent fall, recurrent admission.  He has history of chronic hyponatremia.  On initial evaluation urine sodium 38 after normal saline, urine osmole low serum osmole 270, CT head and C-spine without acute finding.  Patient was altered and confused on presentation.  His torsemide was recently decreased after last admission for his hyponatremia was thought to be due to volume depletion, he has noted worsening lower leg edema since then but no chest pain.  He unfortunately continues to drink alcohol and was drinking whole week without adequate oral intake PTA. Patient sodium overcorrected and was placed on hypotonic fluid, and was monitored serially.  Patient appeared to be shaky and felt of alcohol withdrawal and placed on Librium taper.  Patient was transferred out of ICU 03/28/2020  Subjective: Seen this morning No nausea vomiting fever chills no tremors.  No confusion.    Assessment & Plan:  Hypotonic hyponatremia acute on chronic, suspected beer potomania,  he has been drinking and not eating for the past week prior to admission.  No evidence of fluid overload, baseline sodium 125-130.  TSH three-point 3 AM cortisol normal.  After Fluids in the ED sodium corrected 111->117-patient placed on D5 .  Subsequently discontinued.  Sodium slowly coming up.  palced on salt tab and ivf this am prior to lab- we will hold off on them. Cont to monitor bmp. Goal to baseline . Recent Labs  Lab 04/01/20 1023 04/01/20 1355 04/01/20 1956 04/01/20 2354 04/02/20 0800  NA 121* 122* 119* 120* 124*   Chronic systolic CHF/PAF:TTE from 07/2019 shows EF 25%, LV diffuse  hypokinesis, mod-severe MR, moderate TR, RA and LA dilation, moderate RV dysfunction, and trivial pericardial effusion. D.c wt last wk at 184 lb. Wt at 180 lb. home Aldactone and torsemide on hold given patient's hyponatremia and renal dysfunction. Cont BiDil, Lipitor and amiodarone.  Followed by Dr. Debara Pickett and Dr. Algernon Huxley and has not been seen in a while.  Acute metabolic encephalopathy, presents with confusion in the setting of alcohol intoxication acute on chronic hyponatremia: On Librium for alcohol withdrawal, on supportive measures.  We will try to wean down Librium his mentation stable.  Alcohol abuse with signs of withdrawal: EtOH level 120 on admission.  Patient is placed on 25 mg Librium 3 times daily and CIWA protocol, thiamine folate vitamins.  Start weaning down Librium.  Liver cirrhosis due to chronic hep C and alcoholic liver disease: Aldactone and torsemide on hold due to hyponatremia.  AKI on CKD stage IIIa Baseline creatinine1.2-1.3.  18 up trended to 1.6 now improving 1.5 encourage oral hydration, since improving we will hold off on IV fluids and monitor next 24 hours Recent Labs  Lab 04/01/20 1023 04/01/20 1355 04/01/20 1956 04/01/20 2354 04/02/20 0800  BUN 17 18 20 20 19   CREATININE 1.39* 1.37* 1.72* 1.68* 1.53*   T2DM last A1c 6.2 in March, diet controlled, monitor. Recent Labs  Lab 04/01/20 0818 04/01/20 1130 04/01/20 1527 04/01/20 1706 04/02/20 1107  GLUCAP 100* 151* 137* 174* 167*   Recurrent falls continue PT/OT.  DVT prophylaxis:Heparin. Code Status:full. Family Communication:plan of care discussed with patient at bedside.  Status is: Inpatient Remains inpatient  appropriate because:Persistent severe electrolyte disturbances  Dispo: The patient is from: Home              Anticipated d/c is to: Home health with PT              Anticipated d/c date is: 1 day              Patient currently is not medically stable to d/c. Nutrition: Diet Order              Diet heart healthy/carb modified Room service appropriate? Yes; Fluid consistency: Thin  Diet effective now            Consultants:pccm Procedures:see note Microbiology:see note  Medications: Scheduled Meds: . amiodarone  200 mg Oral Daily  . atorvastatin  20 mg Oral Daily  . chlordiazePOXIDE  25 mg Oral TID  . Chlorhexidine Gluconate Cloth  6 each Topical Daily  . folic acid  1 mg Intravenous Daily  . gabapentin  600 mg Oral Daily  . heparin  5,000 Units Subcutaneous Q8H  . insulin aspart  1-3 Units Subcutaneous TID WC  . sodium chloride  1 g Oral TID WC  . thiamine injection  100 mg Intravenous Daily   Continuous Infusions: . sodium chloride 50 mL/hr at 04/02/20 [id number]    Antimicrobials: Anti-infectives (From admission, onward)   None       Objective: Vitals: Today's Vitals   04/01/20 2005 04/02/20 0121 04/02/20 0500 04/02/20 0758  BP: (!) 157/79   (!) 163/93  Pulse: (!) 49   (!) 59  Resp: 20     Temp: 98.2 F (36.8 C)   (!) 97.5 F (36.4 C)  TempSrc:      SpO2: 99%   96%  Weight:   81.8 kg   Height:      PainSc:  Asleep      Intake/Output Summary (Last 24 hours) at 04/02/2020 1126 Last data filed at 04/01/2020 1131 Gross per 24 hour  Intake --  Output 450 ml  Net -450 ml   Filed Weights   04/01/20 0500 04/01/20 0800 04/02/20 0500  Weight: 81.9 kg 81.9 kg 81.8 kg   Weight change: 0 kg   Intake/Output from previous day: 04/11 0701 - 04/12 0700 In: 200 [P.O.:200] Out: 450 [Urine:450] Intake/Output this shift: No intake/output data recorded.  Examination: General exam: AAOx3 not in acute distress, old for age, weak. HEENT:Oral mucosa moist, Ear/Nose WNL grossly,dentition normal. Respiratory system: bilaterally clear,no use of accessory muscle, non tender. Cardiovascular system: S1 & S2 +, regular, No JVD. Gastrointestinal system: Abdomen soft, NT,ND, BS+. Nervous System:Alert, awake, moving extremities and grossly nonfocal. Extremities: No  edema, distal peripheral pulses palpable.  Skin: No rashes,no icterus. MSK: Normal muscle bulk,tone,power.  Data Reviewed:I have personally reviewed following labs and imaging studies.  CBC: Recent Labs  Lab 03/30/20 1746 03/31/20 0238 03/31/20 0600  WBC 3.4* 3.8* 4.0  HGB 12.0* 11.5* 11.5*  HCT 34.1* 32.8* 32.5*  MCV 88.8 88.4 87.1  PLT 210 197 [id number]   Basic Metabolic Panel: Recent Labs  Lab 03/31/20 0600 03/31/20 1028 04/01/20 1023 04/01/20 1355 04/01/20 1956 04/01/20 2354 04/02/20 0800  NA 120*   < > 121* 122* 119* 120* 124*  K 4.2   < > 4.2 4.2 4.5 4.3 4.1  CL 85*   < > 88* 89* 87* 86* 91*  CO2 23   < > 22 24 19* 23 24  GLUCOSE 158*   < > 167* 139*  154* 157* 146*  BUN 16   < > 17 18 20 20 19   CREATININE 1.35*   < > 1.39* 1.37* 1.72* 1.68* 1.53*  CALCIUM 8.3*   < > 8.3* 8.4* 8.4* 8.3* 8.4*  MG 1.5*  --   --   --   --   --   --   PHOS 3.4  --   --   --   --   --   --    < > = values in this interval not displayed.   GFR: Estimated Creatinine Clearance: 54.7 mL/min (A) (by C-G formula based on SCr of 1.53 mg/dL (H)). Liver Function Tests: Recent Labs  Lab 03/31/20 0142  AST 35  ALT 23  ALKPHOS 57  BILITOT 0.9  PROT 7.1  ALBUMIN 3.1*   No results for input(s): LIPASE, AMYLASE in the last 168 hours. Recent Labs  Lab 03/31/20 0600  AMMONIA 41*   Coagulation Profile: No results for input(s): INR, PROTIME in the last 168 hours. Cardiac Enzymes: No results for input(s): CKTOTAL, CKMB, CKMBINDEX, TROPONINI in the last 168 hours. BNP (last 3 results) No results for input(s): PROBNP in the last 8760 hours. HbA1C: No results for input(s): HGBA1C in the last 72 hours. CBG: Recent Labs  Lab 04/01/20 0818 04/01/20 1130 04/01/20 1527 04/01/20 1706 04/02/20 1107  GLUCAP 100* 151* 137* 174* 167*   Lipid Profile: No results for input(s): CHOL, HDL, LDLCALC, TRIG, CHOLHDL, LDLDIRECT in the last 72 hours. Thyroid Function Tests: No results for input(s):  TSH, T4TOTAL, FREET4, T3FREE, THYROIDAB in the last 72 hours. Anemia Panel: No results for input(s): VITAMINB12, FOLATE, FERRITIN, TIBC, IRON, RETICCTPCT in the last 72 hours. Sepsis Labs: No results for input(s): PROCALCITON, LATICACIDVEN in the last 168 hours.  Recent Results (from the past 240 hour(s))  SARS CORONAVIRUS 2 (TAT 6-24 HRS) Nasopharyngeal Nasopharyngeal Swab     Status: None   Collection Time: 03/23/20  2:39 PM   Specimen: Nasopharyngeal Swab  Result Value Ref Range Status   SARS Coronavirus 2 NEGATIVE NEGATIVE Final    Comment: (NOTE) SARS-CoV-2 target nucleic acids are NOT DETECTED. The SARS-CoV-2 RNA is generally detectable in upper and lower respiratory specimens during the acute phase of infection. Negative results do not preclude SARS-CoV-2 infection, do not rule out co-infections with other pathogens, and should not be used as the sole basis for treatment or other patient management decisions. Negative results must be combined with clinical observations, patient history, and epidemiological information. The expected result is Negative. Fact Sheet for Patients: SugarRoll.be Fact Sheet for Healthcare Providers: https://www.woods-mathews.com/ This test is not yet approved or cleared by the [place] FDA and  has been authorized for detection and/or diagnosis of SARS-CoV-2 by FDA under an Emergency Use Authorization (EUA). This EUA will remain  in effect (meaning this test can be used) for the duration of the COVID-19 declaration under Section 56 4(b)(1) of the Act, 21 U.S.C. section 360bbb-3(b)(1), unless the authorization is terminated or revoked sooner. Performed at [facility], [place] [address], [place], [place] [address]   Respiratory Panel by RT PCR (Flu A&B, Covid) - Nasopharyngeal Swab     Status: None   Collection Time: 03/30/20 10:44 PM   Specimen: Nasopharyngeal Swab  Result Value Ref Range Status    SARS Coronavirus 2 by RT PCR NEGATIVE NEGATIVE Final    Comment: (NOTE) SARS-CoV-2 target nucleic acids are NOT DETECTED. The SARS-CoV-2 RNA is generally detectable in upper respiratoy specimens  during the acute phase of infection. The lowest concentration of SARS-CoV-2 viral copies this assay can detect is 131 copies/mL. A negative result does not preclude SARS-Cov-2 infection and should not be used as the sole basis for treatment or other patient management decisions. A negative result may occur with  improper specimen collection/handling, submission of specimen other than nasopharyngeal swab, presence of viral mutation(s) within the areas targeted by this assay, and inadequate number of viral copies (<131 copies/mL). A negative result must be combined with clinical observations, patient history, and epidemiological information. The expected result is Negative. Fact Sheet for Patients:  PinkCheek.be Fact Sheet for Healthcare Providers:  GravelBags.it This test is not yet ap proved or cleared by the [place] FDA and  has been authorized for detection and/or diagnosis of SARS-CoV-2 by FDA under an Emergency Use Authorization (EUA). This EUA will remain  in effect (meaning this test can be used) for the duration of the COVID-19 declaration under Section 564(b)(1) of the Act, 21 U.S.C. section 360bbb-3(b)(1), unless the authorization is terminated or revoked sooner.    Influenza A by PCR NEGATIVE NEGATIVE Final   Influenza B by PCR NEGATIVE NEGATIVE Final    Comment: (NOTE) The Xpert Xpress SARS-CoV-2/FLU/RSV assay is intended as an aid in  the diagnosis of influenza from Nasopharyngeal swab specimens and  should not be used as a sole basis for treatment. Nasal washings and  aspirates are unacceptable for Xpert Xpress SARS-CoV-2/FLU/RSV  testing. Fact Sheet for Patients: PinkCheek.be Fact  Sheet for Healthcare Providers: GravelBags.it This test is not yet approved or cleared by the [place] FDA and  has been authorized for detection and/or diagnosis of SARS-CoV-2 by  FDA under an Emergency Use Authorization (EUA). This EUA will remain  in effect (meaning this test can be used) for the duration of the  Covid-19 declaration under Section 564(b)(1) of the Act, 21  U.S.C. section 360bbb-3(b)(1), unless the authorization is  terminated or revoked. Performed at [facility], [place] [address] [place], [place] [address]   MRSA PCR Screening     Status: None   Collection Time: 03/31/20  3:00 AM   Specimen: Nasal Mucosa; Nasopharyngeal  Result Value Ref Range Status   MRSA by PCR NEGATIVE NEGATIVE Final    Comment:        The GeneXpert MRSA Assay (FDA approved for NASAL specimens only), is one component of a comprehensive MRSA colonization surveillance program. It is not intended to diagnose MRSA infection nor to guide or monitor treatment for MRSA infections. Performed at [facility], [place] [address], [place], [place] [address]       Radiology Studies: No results found.   LOS: 2 days   Time spent: More than 50% of that time was spent in counseling and/or coordination of care.  Antonieta Pert, MD [facility]  04/02/2020, 11:26 AM

## 2020-04-03 ENCOUNTER — Inpatient Hospital Stay (HOSPITAL_COMMUNITY): Payer: Medicaid Other

## 2020-04-03 DIAGNOSIS — I5022 Chronic systolic (congestive) heart failure: Secondary | ICD-10-CM

## 2020-04-03 DIAGNOSIS — I351 Nonrheumatic aortic (valve) insufficiency: Secondary | ICD-10-CM

## 2020-04-03 DIAGNOSIS — I340 Nonrheumatic mitral (valve) insufficiency: Secondary | ICD-10-CM

## 2020-04-03 DIAGNOSIS — E871 Hypo-osmolality and hyponatremia: Secondary | ICD-10-CM | POA: Diagnosis not present

## 2020-04-03 LAB — BASIC METABOLIC PANEL
Anion gap: 8 (ref 5–15)
Anion gap: 11 (ref 5–15)
Anion gap: 8 (ref 5–15)
Anion gap: 10 (ref 5–15)
BUN: 19 mg/dL (ref 6–20)
BUN: 20 mg/dL (ref 6–20)
BUN: 18 mg/dL (ref 6–20)
BUN: 22 mg/dL — ABNORMAL HIGH (ref 6–20)
CO2: 25 mmol/L (ref 22–32)
CO2: 22 mmol/L (ref 22–32)
CO2: 24 mmol/L (ref 22–32)
CO2: 22 mmol/L (ref 22–32)
Calcium: 8.5 mg/dL — ABNORMAL LOW (ref 8.9–10.3)
Calcium: 8.5 mg/dL — ABNORMAL LOW (ref 8.9–10.3)
Calcium: 8.4 mg/dL — ABNORMAL LOW (ref 8.9–10.3)
Calcium: 8.4 mg/dL — ABNORMAL LOW (ref 8.9–10.3)
Chloride: 92 mmol/L — ABNORMAL LOW (ref 98–111)
Chloride: 93 mmol/L — ABNORMAL LOW (ref 98–111)
Chloride: 97 mmol/L — ABNORMAL LOW (ref 98–111)
Chloride: 90 mmol/L — ABNORMAL LOW (ref 98–111)
Creatinine, Ser: 1.52 mg/dL — ABNORMAL HIGH (ref 0.61–1.24)
Creatinine, Ser: 1.37 mg/dL — ABNORMAL HIGH (ref 0.61–1.24)
Creatinine, Ser: 1.43 mg/dL — ABNORMAL HIGH (ref 0.61–1.24)
Creatinine, Ser: 1.58 mg/dL — ABNORMAL HIGH (ref 0.61–1.24)
GFR calc Af Amer: 57 mL/min — ABNORMAL LOW (ref >60)
GFR calc Af Amer: >60 mL/min (ref >60)
GFR calc Af Amer: >60 mL/min (ref >60)
GFR calc Af Amer: 54 mL/min — ABNORMAL LOW (ref >60)
GFR calc non Af Amer: 49 mL/min — ABNORMAL LOW (ref >60)
GFR calc non Af Amer: 56 mL/min — ABNORMAL LOW (ref >60)
GFR calc non Af Amer: 53 mL/min — ABNORMAL LOW (ref >60)
GFR calc non Af Amer: 47 mL/min — ABNORMAL LOW (ref >60)
Glucose, Bld: 138 mg/dL — ABNORMAL HIGH (ref 70–99)
Glucose, Bld: 148 mg/dL — ABNORMAL HIGH (ref 70–99)
Glucose, Bld: 131 mg/dL — ABNORMAL HIGH (ref 70–99)
Glucose, Bld: 141 mg/dL — ABNORMAL HIGH (ref 70–99)
Potassium: 4.6 mmol/L (ref 3.5–5.1)
Potassium: 4.5 mmol/L (ref 3.5–5.1)
Potassium: 4.5 mmol/L (ref 3.5–5.1)
Potassium: 4.2 mmol/L (ref 3.5–5.1)
Sodium: 125 mmol/L — ABNORMAL LOW (ref 135–145)
Sodium: 126 mmol/L — ABNORMAL LOW (ref 135–145)
Sodium: 129 mmol/L — ABNORMAL LOW (ref 135–145)
Sodium: 122 mmol/L — ABNORMAL LOW (ref 135–145)

## 2020-04-03 LAB — GLUCOSE, CAPILLARY
Glucose-Capillary: 157 mg/dL — ABNORMAL HIGH (ref 70–99)
Glucose-Capillary: 154 mg/dL — ABNORMAL HIGH (ref 70–99)
Glucose-Capillary: 132 mg/dL — ABNORMAL HIGH (ref 70–99)
Glucose-Capillary: 228 mg/dL — ABNORMAL HIGH (ref 70–99)

## 2020-04-03 LAB — ECHOCARDIOGRAM COMPLETE
Height: 71 in
Weight: 2924.18 oz

## 2020-04-03 MED ORDER — TRAMADOL HCL 50 MG PO TABS
50.0000 mg | ORAL_TABLET | Freq: Four times a day (QID) | ORAL | Status: DC | PRN
  Administered 2020-04-03 – 2020-04-08 (×12): 50 mg via ORAL
  Filled 2020-04-03 (×12): qty 1

## 2020-04-03 MED ORDER — SPIRONOLACTONE 12.5 MG HALF TABLET
12.5000 mg | ORAL_TABLET | Freq: Every day | ORAL | Status: DC
  Administered 2020-04-04: 12.5 mg via ORAL
  Filled 2020-04-03: qty 1

## 2020-04-03 MED ORDER — CHLORDIAZEPOXIDE HCL 5 MG PO CAPS
5.0000 mg | ORAL_CAPSULE | Freq: Two times a day (BID) | ORAL | Status: AC
  Administered 2020-04-04 (×2): 5 mg via ORAL
  Filled 2020-04-03 (×2): qty 1

## 2020-04-03 MED ORDER — TORSEMIDE 20 MG PO TABS
20.0000 mg | ORAL_TABLET | Freq: Every day | ORAL | Status: DC
  Administered 2020-04-04: 20 mg via ORAL
  Filled 2020-04-03: qty 1

## 2020-04-03 MED ORDER — CARVEDILOL 3.125 MG PO TABS
3.1250 mg | ORAL_TABLET | Freq: Two times a day (BID) | ORAL | Status: DC
  Administered 2020-04-04 (×2): 3.125 mg via ORAL
  Filled 2020-04-03 (×3): qty 1

## 2020-04-03 MED ORDER — CHLORDIAZEPOXIDE HCL 5 MG PO CAPS
10.0000 mg | ORAL_CAPSULE | Freq: Two times a day (BID) | ORAL | Status: AC
  Administered 2020-04-03 (×2): 10 mg via ORAL
  Filled 2020-04-03 (×2): qty 2

## 2020-04-03 NOTE — Progress Notes (Signed)
PROGRESS NOTE    Brad [name]  [id number] DOB: Oct 18, 1961 DOA: 03/30/2020 PCP: Charlott Rakes, MD   Brief Narrative: 59 year old male history of alcohol abuse, CVA, diabetes mellitus, HFrEF, CKD who fell at the store and was brought to the ER and found to have profound hyponatremia and was admitted to ICU 03/30/20. Patient has history of recurrent fall, recurrent admission.  He has history of chronic hyponatremia.  On initial evaluation urine sodium 38 after normal saline, urine osmole low serum osmole 270, CT head and C-spine without acute finding.  Patient was altered and confused on presentation.  His torsemide was recently decreased after last admission for his hyponatremia was thought to be due to volume depletion, he has noted worsening lower leg edema since then but no chest pain.  He unfortunately continues to drink alcohol and was drinking whole week without adequate oral intake PTA. Patient sodium overcorrected and was placed on hypotonic fluid, and was monitored serially.  Patient appeared to be shaky and felt of alcohol withdrawal and placed on Librium taper.  Patient was transferred out of ICU 03/28/2020. Patient's lab were monitored serially sodium slowly coming up.  Subjective: Seen this morning No nausea, no tremors no chest pain. Had CP yesterday needing NTG, home nitrate/hydaralizine was resumed   Assessment & Plan:  Hypotonic hyponatremia acute on chronic, suspected beer potomania,  he has been drinking and not eating for the past week prior to admission.Baseline sodium 125-130.  TSH stable, AM cortisol normal.  After Fluids in the ED sodium corrected 111->117-patient placed on D5 2/2 overcorrection.  Patient has been off IV fluids subsequently and sodium correcting.Cont to monitor bmp. Will need to address his diuretics given recurrent admission Recent Labs  Lab 04/02/20 0800 04/02/20 1141 04/02/20 1621 04/02/20 2329 04/03/20 0545  NA 124* 124* 125* 125* 126*    Chronic systolic CHF/PAF:TTE from 07/2019 shows EF 25%, LV diffuse hypokinesis, mod-severe MR, moderate TR, RA and LA dilation, moderate RV dysfunction, and trivial pericardial effusion. D.c wt last wk at 184 lb. Wt at 180 lb. home Aldactone and torsemide on hold given patient's hyponatremia and renal dysfunction. Cont BiDil, Lipitor and amiodarone.  Followed by Dr. Debara Pickett and Dr. Algernon Huxley and cardiology consulted here, had chest pain and x-ray shows early CHF.  Diuretics on hold for now-await cardio inputs.  Chest pain- likely atypical ekg afib, trop mild  Flat. Cardio to see, reaseess with ECHO today. Last cath mild cad-few yrs ago.  X-ray showed finding consistent with early CHF [id number]  Acute metabolic encephalopathy, presents with confusion in the setting of alcohol intoxication acute on chronic hyponatremia: On Librium for alcohol withdrawal, on supportive measures.  Weaning down Librium with plan to stop in next 24 hrs.  Headache/neck pain- from fall- ct head and ct  c spine on admission No acute findings-add tramadol prn.says tylenol not working.  Alcohol abuse with signs of withdrawal: EtOH level 120 on admission.  Patient is placed on 25 mg Librium 3 times daily and CIWA protocol, thiamine folate vitamins.  Start weaning down Librium.  Liver cirrhosis due to chronic hep C and alcoholic liver disease: Aldactone and torsemide on hold due to hyponatremia.  AKI on CKD stage IIIa Baseline creatinine1.2-1.3.  18 up trended to 1.6 now improved to baseline. Home aldactone, torsemide is oN hold. Recent Labs  Lab 04/02/20 0800 04/02/20 1141 04/02/20 1621 04/02/20 2329 04/03/20 0545  BUN 19 19 18 19 20   CREATININE 1.53* 1.47* 1.48* 1.52* 1.37*   T2DM last  A1c 6.2 in March, diet controlled, monitor. Recent Labs  Lab 04/01/20 1706 04/02/20 1107 04/02/20 1542 04/02/20 2017 04/03/20 0727  GLUCAP 174* 167* 149* 149* 157*   Recurrent falls continue PT/OT.  DVT prophylaxis:Heparin. Code  Status:full. Family Communication:plan of care discussed with patient at bedside.  Status is: Inpatient Remains inpatient appropriate because:Persistent severe electrolyte disturbances  Dispo: The patient is from: Home              Anticipated d/c is to: Home health with PT              Anticipated d/c date is: 1 day              Patient currently is not medically stable to d/c. Nutrition: Diet Order            Diet heart healthy/carb modified Room service appropriate? Yes; Fluid consistency: Thin  Diet effective now            Consultants: PCCM,CARDIOLOGY Procedures:see note Microbiology:see note  Medications: Scheduled Meds: . amiodarone  200 mg Oral Daily  . amitriptyline  20 mg Oral QHS  . atorvastatin  20 mg Oral Daily  . chlordiazePOXIDE  10 mg Oral TID  . DULoxetine  60 mg Oral Daily  . folic acid  1 mg Oral Daily  . gabapentin  600 mg Oral Daily  . heparin  5,000 Units Subcutaneous Q8H  . insulin aspart  1-3 Units Subcutaneous TID WC  . isosorbide-hydrALAZINE  1 tablet Oral BID  . mirtazapine  15 mg Oral QHS  . thiamine  100 mg Oral Daily   Continuous Infusions:   Antimicrobials: Anti-infectives (From admission, onward)   None       Objective: Vitals: Today's Vitals   04/03/20 0500 04/03/20 0800 04/03/20 0831 04/03/20 0832  BP:   116/76   Pulse:   (!) 46 60  Resp:  18 19   Temp:   97.7 F (36.5 C)   TempSrc:      SpO2:   98% 97%  Weight: 82.9 kg     Height:      PainSc:  10-Worst pain ever      Intake/Output Summary (Last 24 hours) at 04/03/2020 0914 Last data filed at 04/03/2020 0800 Gross per 24 hour  Intake 240 ml  Output 1875 ml  Net -1635 ml   Filed Weights   04/01/20 0800 04/02/20 0500 04/03/20 0500  Weight: 81.9 kg 81.8 kg 82.9 kg   Weight change: 1 kg   Intake/Output from previous day: 04/12 0701 - 04/13 0700 In: 240 [P.O.:240] Out: 1375 [Urine:1375] Intake/Output this shift: Total I/O In: -  Out: 500  [Urine:500]  Examination: General exam: Alert awake oriented, NAD, old for age  HEENT:Oral mucosa moist, Ear/Nose WNL grossly,dentition normal. Respiratory system: Bilateral basal crackles, no use of accessory muscles, nontender, no orthopnea.   Cardiovascular system: S1 & S2 +, regular. Gastrointestinal system: Abdomen soft, NT,ND, BS+. Nervous System:Alert, awake, moving extremities and grossly nonfocal. Extremities: No edema, distal peripheral pulses palpable.  Skin: No rashes,no icterus. MSK: Normal muscle bulk,tone,power.  Data Reviewed:I have personally reviewed following labs and imaging studies.  CBC: Recent Labs  Lab 03/30/20 1746 03/31/20 0238 03/31/20 0600  WBC 3.4* 3.8* 4.0  HGB 12.0* 11.5* 11.5*  HCT 34.1* 32.8* 32.5*  MCV 88.8 88.4 87.1  PLT 210 197 [id number]   Basic Metabolic Panel: Recent Labs  Lab 03/31/20 0600 03/31/20 1028 04/02/20 0800 04/02/20 1141 04/02/20 1621 04/02/20 2329 04/03/20  0545  NA 120*   < > 124* 124* 125* 125* 126*  K 4.2   < > 4.1 4.2 4.4 4.6 4.5  CL 85*   < > 91* 91* 91* 92* 93*  CO2 23   < > 24 25 26 25 22   GLUCOSE 158*   < > 146* 172* 126* 138* 148*  BUN 16   < > 19 19 18 19 20   CREATININE 1.35*   < > 1.53* 1.47* 1.48* 1.52* 1.37*  CALCIUM 8.3*   < > 8.4* 8.5* 8.5* 8.5* 8.5*  MG 1.5*  --   --   --   --   --   --   PHOS 3.4  --   --   --   --   --   --    < > = values in this interval not displayed.   GFR: Estimated Creatinine Clearance: 61.1 mL/min (A) (by C-G formula based on SCr of 1.37 mg/dL (H)). Liver Function Tests: Recent Labs  Lab 03/31/20 0142  AST 35  ALT 23  ALKPHOS 57  BILITOT 0.9  PROT 7.1  ALBUMIN 3.1*   No results for input(s): LIPASE, AMYLASE in the last 168 hours. Recent Labs  Lab 03/31/20 0600  AMMONIA 41*   Coagulation Profile: No results for input(s): INR, PROTIME in the last 168 hours. Cardiac Enzymes: No results for input(s): CKTOTAL, CKMB, CKMBINDEX, TROPONINI in the last 168 hours. BNP  (last 3 results) No results for input(s): PROBNP in the last 8760 hours. HbA1C: No results for input(s): HGBA1C in the last 72 hours. CBG: Recent Labs  Lab 04/01/20 1706 04/02/20 1107 04/02/20 1542 04/02/20 2017 04/03/20 0727  GLUCAP 174* 167* 149* 149* 157*   Lipid Profile: No results for input(s): CHOL, HDL, LDLCALC, TRIG, CHOLHDL, LDLDIRECT in the last 72 hours. Thyroid Function Tests: No results for input(s): TSH, T4TOTAL, FREET4, T3FREE, THYROIDAB in the last 72 hours. Anemia Panel: No results for input(s): VITAMINB12, FOLATE, FERRITIN, TIBC, IRON, RETICCTPCT in the last 72 hours. Sepsis Labs: No results for input(s): PROCALCITON, LATICACIDVEN in the last 168 hours.  Recent Results (from the past 240 hour(s))  Respiratory Panel by RT PCR (Flu A&B, Covid) - Nasopharyngeal Swab     Status: None   Collection Time: 03/30/20 10:44 PM   Specimen: Nasopharyngeal Swab  Result Value Ref Range Status   SARS Coronavirus 2 by RT PCR NEGATIVE NEGATIVE Final    Comment: (NOTE) SARS-CoV-2 target nucleic acids are NOT DETECTED. The SARS-CoV-2 RNA is generally detectable in upper respiratoy specimens during the acute phase of infection. The lowest concentration of SARS-CoV-2 viral copies this assay can detect is 131 copies/mL. A negative result does not preclude SARS-Cov-2 infection and should not be used as the sole basis for treatment or other patient management decisions. A negative result may occur with  improper specimen collection/handling, submission of specimen other than nasopharyngeal swab, presence of viral mutation(s) within the areas targeted by this assay, and inadequate number of viral copies (<131 copies/mL). A negative result must be combined with clinical observations, patient history, and epidemiological information. The expected result is Negative. Fact Sheet for Patients:  PinkCheek.be Fact Sheet for Healthcare Providers:   GravelBags.it This test is not yet ap proved or cleared by the [place] FDA and  has been authorized for detection and/or diagnosis of SARS-CoV-2 by FDA under an Emergency Use Authorization (EUA). This EUA will remain  in effect (meaning this test can be used) for the  duration of the COVID-19 declaration under Section 564(b)(1) of the Act, 21 U.S.C. section 360bbb-3(b)(1), unless the authorization is terminated or revoked sooner.    Influenza A by PCR NEGATIVE NEGATIVE Final   Influenza B by PCR NEGATIVE NEGATIVE Final    Comment: (NOTE) The Xpert Xpress SARS-CoV-2/FLU/RSV assay is intended as an aid in  the diagnosis of influenza from Nasopharyngeal swab specimens and  should not be used as a sole basis for treatment. Nasal washings and  aspirates are unacceptable for Xpert Xpress SARS-CoV-2/FLU/RSV  testing. Fact Sheet for Patients: PinkCheek.be Fact Sheet for Healthcare Providers: GravelBags.it This test is not yet approved or cleared by the [place] FDA and  has been authorized for detection and/or diagnosis of SARS-CoV-2 by  FDA under an Emergency Use Authorization (EUA). This EUA will remain  in effect (meaning this test can be used) for the duration of the  Covid-19 declaration under Section 564(b)(1) of the Act, 21  U.S.C. section 360bbb-3(b)(1), unless the authorization is  terminated or revoked. Performed at [facility], [place] [address] [place], [place] [address]   MRSA PCR Screening     Status: None   Collection Time: 03/31/20  3:00 AM   Specimen: Nasal Mucosa; Nasopharyngeal  Result Value Ref Range Status   MRSA by PCR NEGATIVE NEGATIVE Final    Comment:        The GeneXpert MRSA Assay (FDA approved for NASAL specimens only), is one component of a comprehensive MRSA colonization surveillance program. It is not intended to diagnose MRSA infection nor to guide  or monitor treatment for MRSA infections. Performed at [facility], [place] [address], [place], [place] [address]       Radiology Studies: DG Chest 1 View  Result Date: 04/02/2020 CLINICAL DATA:  Chest pain, history of congestive heart failure and diabetes EXAM: CHEST  1 VIEW COMPARISON:  03/23/2020 FINDINGS: Single frontal view of the chest demonstrates an enlarged cardiac silhouette. There is increased vascular congestion and interval development of diffuse interstitial prominence. No dense consolidation. No effusion or pneumothorax. No acute bony abnormalities. IMPRESSION: 1. Findings consistent with early congestive heart failure. Electronically Signed   By: Randa Ngo M.D.   On: 04/02/2020 16:04     LOS: 3 days   Time spent: More than 50% of that time was spent in counseling and/or coordination of care.  Antonieta Pert, MD [facility]  04/03/2020, 9:14 AM

## 2020-04-03 NOTE — Progress Notes (Signed)
Physical Therapy Treatment Patient Details Name: Brad [name] MRN: [id number] DOB: 1961/03/27 Today's Date: 04/03/2020    History of Present Illness 59 year old male history of alcohol abuse, CVA, diabetes mellitus, HFrEF, CKD who fell at the store and was brought to the ER and found to have profound hyponatremia and was admitted to ICU 03/30/20. Patient has history of recurrent fall, recurrent admission.  He has history of chronic hyponatremia.     PT Comments    Pt was agreeable and participative.  Pt with textbook use of the cane which gave him enough assist to be safe and independent.  Emphasis on progressing gait stability and safety on the stairs.   Follow Up Recommendations  Other (comment);Home health PT;Supervision - Intermittent     Equipment Recommendations  None recommended by PT    Recommendations for Other Services       Precautions / Restrictions Precautions Precautions: Fall    Mobility  Bed Mobility Overal bed mobility: Modified Independent                Transfers Overall transfer level: Needs assistance Equipment used: None Transfers: Sit to/from Stand Sit to Stand: Min guard         General transfer comment: cues to self-monitor for activity tolerance  Ambulation/Gait Ambulation/Gait assistance: Min guard;Min assist   Assistive device: Straight cane Gait Pattern/deviations: Step-through pattern     General Gait Details: Unsteady gait with erratic step width, occasional min assist to steady due to small losses of balance   Stairs             Wheelchair Mobility    Modified [name] (Stroke Patients Only)       Balance                                            Cognition Arousal/Alertness: Awake/alert Behavior During Therapy: WFL for tasks assessed/performed Overall Cognitive Status: Within Functional Limits for tasks assessed(NT formally)                                         Exercises      General Comments        Pertinent Vitals/Pain Pain Assessment: Faces Faces Pain Scale: No hurt    Home Living                      Prior Function            PT Goals (current goals can now be found in the care plan section) Acute Rehab PT Goals Patient Stated Goal: Looking to get some help  for my depression. PT Goal Formulation: With patient Time For Goal Achievement: 04/07/20 Potential to Achieve Goals: Good    Frequency    Min 3X/week      PT Plan      Co-evaluation              AM-PAC PT "6 Clicks" Mobility   Outcome Measure  Help needed turning from your back to your side while in a flat bed without using bedrails?: None Help needed moving from lying on your back to sitting on the side of a flat bed without using bedrails?: None Help needed moving to and from a bed to a chair (including a wheelchair)?:  A Little Help needed standing up from a chair using your arms (e.g., wheelchair or bedside chair)?: A Little Help needed to walk in hospital room?: A Little Help needed climbing 3-5 steps with a railing? : A Lot 6 Click Score: 19    End of Session Equipment Utilized During Treatment: Gait belt Activity Tolerance: Patient tolerated treatment well Patient left: in bed;with call bell/phone within reach;with bed alarm set Nurse Communication: Mobility status PT Visit Diagnosis: Other abnormalities of gait and mobility (R26.89);Muscle weakness (generalized) (M62.81)     Time: [id number] PT Time Calculation (min) (ACUTE ONLY): 35 min  Charges:  $Gait Training: 8-22 mins $Therapeutic Activity: 8-22 mins                     04/03/2020  Brad [name]., PT Acute Rehabilitation Services [phone]  (pager) [phone]  (office)   Brad [name] Brad [name] 04/03/2020, 5:50 PM

## 2020-04-03 NOTE — Progress Notes (Signed)
  Echocardiogram 2D Echocardiogram has been performed.  Brad [name] 04/03/2020, 2:31 PM
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
## 2020-04-04 ENCOUNTER — Ambulatory Visit: Payer: Medicaid Other | Admitting: Family Medicine

## 2020-04-04 DIAGNOSIS — F10929 Alcohol use, unspecified with intoxication, unspecified: Secondary | ICD-10-CM

## 2020-04-04 DIAGNOSIS — I5043 Acute on chronic combined systolic (congestive) and diastolic (congestive) heart failure: Secondary | ICD-10-CM

## 2020-04-04 DIAGNOSIS — R079 Chest pain, unspecified: Secondary | ICD-10-CM

## 2020-04-04 LAB — BASIC METABOLIC PANEL
Anion gap: 11 (ref 5–15)
Anion gap: 12 (ref 5–15)
BUN: 23 mg/dL — ABNORMAL HIGH (ref 6–20)
BUN: 23 mg/dL — ABNORMAL HIGH (ref 6–20)
CO2: 20 mmol/L — ABNORMAL LOW (ref 22–32)
CO2: 21 mmol/L — ABNORMAL LOW (ref 22–32)
Calcium: 8.4 mg/dL — ABNORMAL LOW (ref 8.9–10.3)
Calcium: 8.4 mg/dL — ABNORMAL LOW (ref 8.9–10.3)
Chloride: 88 mmol/L — ABNORMAL LOW (ref 98–111)
Chloride: 92 mmol/L — ABNORMAL LOW (ref 98–111)
Creatinine, Ser: 1.52 mg/dL — ABNORMAL HIGH (ref 0.61–1.24)
Creatinine, Ser: 1.52 mg/dL — ABNORMAL HIGH (ref 0.61–1.24)
GFR calc Af Amer: 57 mL/min — ABNORMAL LOW (ref >60)
GFR calc Af Amer: 57 mL/min — ABNORMAL LOW (ref >60)
GFR calc non Af Amer: 49 mL/min — ABNORMAL LOW (ref >60)
GFR calc non Af Amer: 49 mL/min — ABNORMAL LOW (ref >60)
Glucose, Bld: 158 mg/dL — ABNORMAL HIGH (ref 70–99)
Glucose, Bld: 159 mg/dL — ABNORMAL HIGH (ref 70–99)
Potassium: 4.5 mmol/L (ref 3.5–5.1)
Potassium: 4.6 mmol/L (ref 3.5–5.1)
Sodium: 121 mmol/L — ABNORMAL LOW (ref 135–145)
Sodium: 123 mmol/L — ABNORMAL LOW (ref 135–145)

## 2020-04-04 LAB — GLUCOSE, CAPILLARY
Glucose-Capillary: 131 mg/dL — ABNORMAL HIGH (ref 70–99)
Glucose-Capillary: 128 mg/dL — ABNORMAL HIGH (ref 70–99)
Glucose-Capillary: 179 mg/dL — ABNORMAL HIGH (ref 70–99)
Glucose-Capillary: 152 mg/dL — ABNORMAL HIGH (ref 70–99)

## 2020-04-04 MED ORDER — SPIRONOLACTONE 25 MG PO TABS
25.0000 mg | ORAL_TABLET | Freq: Every day | ORAL | Status: DC
  Administered 2020-04-05 – 2020-04-08 (×4): 25 mg via ORAL
  Filled 2020-04-04 (×4): qty 1

## 2020-04-04 MED ORDER — TOLVAPTAN 15 MG PO TABS
15.0000 mg | ORAL_TABLET | ORAL | Status: AC
  Administered 2020-04-04 – 2020-04-05 (×2): 15 mg via ORAL
  Filled 2020-04-04 (×2): qty 1

## 2020-04-04 MED ORDER — TORSEMIDE 20 MG PO TABS
40.0000 mg | ORAL_TABLET | Freq: Every day | ORAL | Status: DC
  Administered 2020-04-05: 40 mg via ORAL
  Filled 2020-04-04: qty 2

## 2020-04-04 NOTE — Progress Notes (Addendum)
Advanced Heart Failure Rounding Note  PCP-Cardiologist: [name], MD   Subjective:    Sodium [phone]   Denies SOB. Says he has been drinking lots of water and eating ice.   Objective:   Weight Range: 85.6 kg Body mass index is 26.32 kg/m.   Vital Signs:   Temp:  [97.9 F (36.6 C)-98.3 F (36.8 C)] 97.9 F (36.6 C) (04/14 0805) Pulse Rate:  [51-56] 56 (04/14 0805) Resp:  [12-20] 19 (04/14 0805) BP: (117-134)/(61-82) 127/61 (04/14 0805) SpO2:  [100 %] 100 % (04/14 0805) Weight:  [85.6 kg] 85.6 kg (04/14 0500) Last BM Date: 04/03/20  Weight change: Filed Weights   04/02/20 0500 04/03/20 0500 04/04/20 0500  Weight: 81.8 kg 82.9 kg 85.6 kg    Intake/Output:   Intake/Output Summary (Last 24 hours) at 04/04/2020 1143 Last data filed at 04/04/2020 0500 Gross per 24 hour  Intake 480 ml  Output 1900 ml  Net -1420 ml      Physical Exam    General:  Sitting on the side of the bed. No resp difficulty HEENT: Normal Neck: Supple. JVP 6-7  . Carotids 2+ bilat; no bruits. No lymphadenopathy or thyromegaly appreciated. Cor: PMI nondisplaced. Irregular rate & rhythm. No rubs, gallops or murmurs. Lungs: Clear Abdomen: Soft, nontender, nondistended. No hepatosplenomegaly. No bruits or masses. Good bowel sounds. Extremities: No cyanosis, clubbing, rash, edema Neuro: Alert & orientedx3, cranial nerves grossly intact. moves all 4 extremities w/o difficulty. Affect pleasant   Telemetry   A fib 50s   EKG   n/a  Labs    CBC No results for input(s): WBC, NEUTROABS, HGB, HCT, MCV, PLT in the last 72 hours. Basic Metabolic Panel Recent Labs    04/03/20 1809 04/04/20 0029  NA 122* 123*  K 4.2 4.5  CL 90* 92*  CO2 22 20*  GLUCOSE 141* 158*  BUN 22* 23*  CREATININE 1.58* 1.52*  CALCIUM 8.4* 8.4*   Liver Function Tests No results for input(s): AST, ALT, ALKPHOS, BILITOT, PROT, ALBUMIN in the last 72 hours. No results for input(s): LIPASE, AMYLASE in  the last 72 hours. Cardiac Enzymes No results for input(s): CKTOTAL, CKMB, CKMBINDEX, TROPONINI in the last 72 hours.  BNP: BNP (last 3 results) Recent Labs    01/01/20 0027 01/10/20 1622 01/16/20 1210  BNP 1,985.9* 1,118.5* 476.0*    ProBNP (last 3 results) No results for input(s): PROBNP in the last 8760 hours.   D-Dimer No results for input(s): DDIMER in the last 72 hours. Hemoglobin A1C No results for input(s): HGBA1C in the last 72 hours. Fasting Lipid Panel No results for input(s): CHOL, HDL, LDLCALC, TRIG, CHOLHDL, LDLDIRECT in the last 72 hours. Thyroid Function Tests No results for input(s): TSH, T4TOTAL, T3FREE, THYROIDAB in the last 72 hours.  Invalid input(s): FREET3  Other results:   Imaging    ECHOCARDIOGRAM COMPLETE  Result Date: 04/03/2020    ECHOCARDIOGRAM REPORT   Patient Name:   Brad [name] Date of Exam: 04/03/2020 Medical Rec #:  [id number]       Height:       71.0 in Accession #:    [id number]      Weight:       182.8 lb Date of Birth:  11/03/1961      BSA:          2.029 m Patient Age:    46 years        BP:  116/76 mmHg Patient Gender: M               HR:           60 bpm. Exam Location:  Inpatient Procedure: 2D Echo, Cardiac Doppler and Color Doppler Indications:    Chest pain 486.50/R07.9  History:        Patient has prior history of Echocardiogram examinations, most                 recent 08/22/2019. CAD, Arrythmias:Atrial Fibrillation; Risk                 Factors:Diabetes, Current Smoker and Hypertension. Polysubstance                 abuse. CKD.  Sonographer:    Clayton Lefort RDCS (AE) Referring Phys: [id number] [place] IMPRESSIONS  1. Left ventricular ejection fraction, by estimation, is 35 to 40%. The left ventricle has moderately decreased function. The left ventricle demonstrates global hypokinesis. The left ventricular internal cavity size was mildly dilated. There is mild eccentric left ventricular hypertrophy. Left ventricular diastolic  function could not be evaluated.  2. Right ventricular systolic function is moderately reduced. The right ventricular size is normal. Tricuspid regurgitation signal is inadequate for assessing PA pressure.  3. Left atrial size was severely dilated.  4. Right atrial size was severely dilated.  5. The mitral valve is normal in structure. Moderate to severe mitral valve regurgitation.  6. Tricuspid valve regurgitation is moderate.  7. The aortic valve is normal in structure. Aortic valve regurgitation is mild.  8. The inferior vena cava is dilated in size with <50% respiratory variability, suggesting right atrial pressure of 15 mmHg. Comparison(s): Prior images reviewed side by side. The left ventricular function has improved. The rhythm is now atrial fibrillation. The mitral insufficiency severity appears unchanged. FINDINGS  Left Ventricle: Left ventricular ejection fraction, by estimation, is 35 to 40%. The left ventricle has moderately decreased function. The left ventricle demonstrates global hypokinesis. The left ventricular internal cavity size was mildly dilated. There is mild eccentric left ventricular hypertrophy. Left ventricular diastolic function could not be evaluated due to atrial fibrillation. Left ventricular diastolic function could not be evaluated. Right Ventricle: The right ventricular size is normal. No increase in right ventricular wall thickness. Right ventricular systolic function is moderately reduced. Tricuspid regurgitation signal is inadequate for assessing PA pressure. The tricuspid regurgitant velocity is 3.13 m/s, and with an assumed right atrial pressure of 15 mmHg, the estimated right ventricular systolic pressure is [id number] mmHg. Left Atrium: Left atrial size was severely dilated. Right Atrium: Right atrial size was severely dilated. Pericardium: Trivial pericardial effusion is present. Mitral Valve: The mitral valve is normal in structure. There is mild thickening of the mitral valve  leaflet(s). Mild mitral annular calcification. Moderate to severe mitral valve regurgitation, with centrally-directed jet. MV peak gradient, 11.2 mmHg. The mean mitral valve gradient is 3.0 mmHg. Tricuspid Valve: The tricuspid valve is normal in structure. Tricuspid valve regurgitation is moderate. Aortic Valve: The aortic valve is normal in structure. Aortic valve regurgitation is mild. Aortic regurgitation PHT measures 973 msec. Aortic valve mean gradient measures 3.5 mmHg. Aortic valve peak gradient measures 6.9 mmHg. Aortic valve area, by VTI measures 1.82 cm. Pulmonic Valve: The pulmonic valve was normal in structure. Pulmonic valve regurgitation is mild. Aorta: The aortic root is normal in size and structure. Venous: The inferior vena cava is dilated in size with less than 50% respiratory variability, suggesting  right atrial pressure of 15 mmHg. IAS/Shunts: No atrial level shunt detected by color flow Doppler.  LEFT VENTRICLE PLAX 2D LVIDd:         6.17 cm LVIDs:         4.98 cm LV PW:         1.43 cm LV IVS:        1.26 cm LVOT diam:     2.30 cm LV SV:         47 LV SV Index:   23 LVOT Area:     4.15 cm  LV Volumes (MOD) LV vol d, MOD A2C: 169.0 ml LV vol d, MOD A4C: 204.0 ml LV vol s, MOD A2C: 94.9 ml LV vol s, MOD A4C: 121.0 ml LV SV MOD A2C:     74.1 ml LV SV MOD A4C:     204.0 ml LV SV MOD BP:      76.8 ml RIGHT VENTRICLE            IVC RV Basal diam:  3.41 cm    IVC diam: 2.97 cm RV S prime:     6.37 cm/s TAPSE (M-mode): 1.4 cm LEFT ATRIUM              Index       RIGHT ATRIUM           Index LA diam:        4.70 cm  2.32 cm/m  RA Area:     29.70 cm LA Vol (A2C):   166.0 ml 81.80 ml/m RA Volume:   113.00 ml 55.68 ml/m LA Vol (A4C):   111.0 ml 54.70 ml/m LA Biplane Vol: 144.0 ml 70.96 ml/m  AORTIC VALVE AV Area (Vmax):    1.97 cm AV Area (Vmean):   1.74 cm AV Area (VTI):     1.82 cm AV Vmax:           131.50 cm/s AV Vmean:          88.850 cm/s AV VTI:            0.255 m AV Peak Grad:      6.9  mmHg AV Mean Grad:      3.5 mmHg LVOT Vmax:         62.50 cm/s LVOT Vmean:        37.300 cm/s LVOT VTI:          0.112 m LVOT/AV VTI ratio: 0.44 AI PHT:            973 msec  AORTA Ao Root diam: 3.70 cm Ao Asc diam:  3.70 cm MITRAL VALVE                 TRICUSPID VALVE MV Peak grad: 11.2 mmHg      TR Peak grad:   39.2 mmHg MV Mean grad: 3.0 mmHg       TR Vmax:        313.00 cm/s MV Vmax:      1.67 m/s MV Vmean:     67.4 cm/s      SHUNTS MR Peak grad:    107.7 mmHg  Systemic VTI:  0.11 m MR Mean grad:    70.0 mmHg   Systemic Diam: 2.30 cm MR Vmax:         519.00 cm/s MR Vmean:        391.0 cm/s MR PISA:         3.08 cm MR PISA Eff ROA:  18 mm MR PISA Radius:  0.70 cm Mihai Croitoru MD Electronically signed by Sanda Klein MD Signature Date/Time: 04/03/2020/3:28:58 PM    Final       Medications:     Scheduled Medications:  amitriptyline  20 mg Oral QHS   atorvastatin  20 mg Oral Daily   carvedilol  3.125 mg Oral BID WC   chlordiazePOXIDE  5 mg Oral BID   DULoxetine  60 mg Oral Daily   folic acid  1 mg Oral Daily   gabapentin  600 mg Oral Daily   heparin  5,000 Units Subcutaneous Q8H   insulin aspart  1-3 Units Subcutaneous TID WC   isosorbide-hydrALAZINE  1 tablet Oral BID   mirtazapine  15 mg Oral QHS   thiamine  100 mg Oral Daily     Infusions:   PRN Medications:  acetaminophen, albuterol, docusate sodium, nitroGLYCERIN, polyethylene glycol, traMADol     Assessment/Plan   1. Syncope ETOH/Intoxication    2. Hyponatremia  Resolved with NS and holding diuretics.  - Sodium 111 on admit.  - Sodium trending down 123 today.  - He has been drinking lots of water/ice. Discussed limiting free water   3. Biventricular HF  EF 07/2019 EF 25-30% Moderately reduced RV - Volume status stable. Hold off on diuretics for now.  - Continue carvedilol 3.125 mg twice a day. No room to increase with heart rate in the 50s.  - Continue bidil 1 tab three times a day  - No spiro.    4. A fib  -Rate controlled. in the 68s -Has been in A fib for several months. Off eliquis with multiple falls.  -Continue carvedilol 3.125 mg twice a day.   5. ETOH Abuse Discussed cessation.   Consult cardiac rehab.   Length of Stay: 4  Amy Clegg, NP  04/04/2020, 11:43 AM  Advanced Heart Failure Team Pager [phone] (M-F; 7a - 4p)  Please contact [facility] for night-coverage after hours (4p -7a ) and weekends on [email or web address]  Patient seen with NP, agree with the above note.   Sodium back down to 123 on last check.   He has been drinking a lot of fluid and eating ice. He has been bradycardic with atrial fibrillation in 40s-50s.   Echo showed EF 35-40%, moderate RV dysfunction, mod-severe MR, dilated IVC.   General: NAD Neck: JVP 12-14 cm, no thyromegaly or thyroid nodule.  Lungs: Clear to auscultation bilaterally with normal respiratory effort. CV: Nondisplaced PMI.  Heart regular S1/S2, no S3/S4, 2/6 HSM apex.  No peripheral edema.  Abdomen: Soft, nontender, no hepatosplenomegaly, no distention.  Skin: Intact without lesions or rashes.  Neurologic: Alert and oriented x 3.  Psych: Normal affect. Extremities: No clubbing or cyanosis.  HEENT: Normal.   Patient is volume overloaded on exam with dilated IVC on echo.  Sodium is down to 123 again.  Hypervolemic hyponatremia.  - 1200 cc fluid restriction.  - Restart torsemide 40 mg daily (may need IV diuresis tomorrow).  - I will give him a dose of tolvaptan this afternoon.  - Restart spironolactone 25 mg daily.   HR in 40s-50s in atrial fibrillation.  Continue Coreg 3.125 mg bid for now.    Loralie Champagne 04/04/2020 4:22 PM

## 2020-04-04 NOTE — Plan of Care (Signed)
  Problem: Education: Goal: Knowledge of General Education information will improve Description: Including pain rating scale, medication(s)/side effects and non-pharmacologic comfort measures Outcome: Progressing   Problem: Health Behavior/Discharge Planning: Goal: Ability to manage health-related needs will improve Outcome: Progressing   Problem: Clinical Measurements: Goal: Ability to maintain clinical measurements within normal limits will improve Outcome: Progressing Goal: Diagnostic test results will improve Outcome: Progressing Goal: Cardiovascular complication will be avoided Outcome: Progressing   Problem: Activity: Goal: Risk for activity intolerance will decrease Outcome: Progressing   Problem: Elimination: Goal: Will not experience complications related to bowel motility Outcome: Progressing Goal: Will not experience complications related to urinary retention Outcome: Progressing   Problem: Pain Managment: Goal: General experience of comfort will improve Outcome: Progressing Note: Pain adequately managed by PRN analgesics   Problem: Safety: Goal: Ability to remain free from injury will improve Outcome: Progressing   Problem: Skin Integrity: Goal: Risk for impaired skin integrity will decrease Outcome: Progressing   Problem: Clinical Measurements: Goal: Will remain free from infection Outcome: Completed/Met Goal: Respiratory complications will improve Outcome: Completed/Met   Problem: Nutrition: Goal: Adequate nutrition will be maintained Outcome: Completed/Met Note: Patient stated appetite is good, denies nausea or vomiting.   Problem: Coping: Goal: Level of anxiety will decrease Outcome: Completed/Met

## 2020-04-04 NOTE — Progress Notes (Signed)
PROGRESS NOTE  Brad [name] [id number] DOB: [birth year] DOA: 03/30/2020 PCP: Charlott Rakes, MD   LOS: 4 days   Brief Narrative / Interim history: 59 year old male with alcohol abuse, prior CVA, DM, chronic systolic CHF, chronic kidney disease came to the hospital after falling at the store, was found to be hyponatremic and admitted to the ICU on 4/9.  He has a history of recurrent falls.  He also has a history of chronic hyponatremia with sodium ranging 125-130.  He was found to be in alcohol withdrawal and placed on Librium taper, transferred out of the ICU on 4/7.  Subjective / 24h Interval events: No complaints this morning, he is feeling great.  Denies any chest pain, denies any shortness of breath.  Assessment & Plan: Principal Problem Hyponatremia, acute on chronic -Suspected beer potomania.  He received fluids in the ED and then sodium corrected 1 11-1 17 and subsequently he was placed on D5 W to avoid overcorrection.  With IV fluids his sodium has improved and it was up to 129 yesterday however this morning is back into the 123 range.  Patient tells me that he has been drinking a lot of ice chips, at least 2 pitchers since yesterday plus Sprite.  He has been trying to stay hydrated. -Patient on fluid restriction, he was resumed on his diuretics by cardiology this morning.  Continue to monitor sodium level, when it gets in the upper 120s he will be stable for discharge, hopefully tomorrow  Active Problems Chronic systolic CHF/PAF -TTE from 07/2019 shows EF 25%, LV diffuse hypokinesis, mod-severe MR, moderate TR, RA and LA dilation, moderate RV dysfunction, and trivial pericardial effusion.  Updated 2D echo on 4/13 showed an EF of 35-40% with global hypokinesis of the left ventricle -Cardiology consulted, has gained some weight since yesterday and with hyponatremia suspect due to liberal water intake.  Resumed diuretics, placed on fluid restriction  Chest pain - likely atypical  ekg afib, trop mild  Flat.  Resolved  Acute metabolic encephalopathy  -presents with confusion in the setting of alcohol intoxication acute on chronic hyponatremia: On Librium for alcohol withdrawal, on supportive measures.  -Weaning off Librium, mental status back to normal  Headache/neck pain - from fall- ct head and ct  c spine on admission No acute findings-add tramadol prn.says tylenol not working.  Alcohol abuse with signs of withdrawal  -EtOH level 120 on admission.  Patient is placed on 25 mg Librium 3 times daily and CIWA protocol, thiamine folate vitamins.  Start weaning down Librium.  Liver cirrhosis  -due to chronic hep C and alcoholic liver disease -Contributing to hyponatremia as well  AKI on CKD stage IIIa  -Baseline creatinine1.2-1.3.  Creatinine 1.5 and overall stable.  Resume diuretics and monitor  DM2-A1c 6.2 in March, diet controlled  Recurrent falls-PT consulted, will need home health at the time of discharge  Scheduled Meds: . amitriptyline  20 mg Oral QHS  . atorvastatin  20 mg Oral Daily  . carvedilol  3.125 mg Oral BID WC  . chlordiazePOXIDE  5 mg Oral BID  . DULoxetine  60 mg Oral Daily  . folic acid  1 mg Oral Daily  . gabapentin  600 mg Oral Daily  . heparin  5,000 Units Subcutaneous Q8H  . insulin aspart  1-3 Units Subcutaneous TID WC  . isosorbide-hydrALAZINE  1 tablet Oral BID  . mirtazapine  15 mg Oral QHS  . spironolactone  12.5 mg Oral Daily  . thiamine  100 mg  Oral Daily  . torsemide  20 mg Oral Daily   Continuous Infusions: PRN Meds:.acetaminophen, albuterol, docusate sodium, nitroGLYCERIN, polyethylene glycol, traMADol  DVT prophylaxis: SCDs Code Status: Full code Family Communication: no family at bedside  Patient admitted from: home  Anticipated d/c place: home Barriers to d/c: Recurrent hyponatremia, anticipate discharge within 24 hours if sodium level improves  Consultants:  Cardiology  Procedures:  2D  echo  Microbiology  None   Antimicrobials: None     Objective: Vitals:   04/03/20 2247 04/04/20 0500 04/04/20 0547 04/04/20 0805  BP: 134/82   127/61  Pulse: (!) 54   (!) 56  Resp: 20  17 19   Temp: 98.3 F (36.8 C)   97.9 F (36.6 C)  TempSrc:    Oral  SpO2: 100%   100%  Weight:  85.6 kg    Height:        Intake/Output Summary (Last 24 hours) at 04/04/2020 1048 Last data filed at 04/04/2020 0500 Gross per 24 hour  Intake 480 ml  Output 1900 ml  Net -1420 ml   Filed Weights   04/02/20 0500 04/03/20 0500 04/04/20 0500  Weight: 81.8 kg 82.9 kg 85.6 kg    Examination:  Constitutional: NAD Eyes: no scleral icterus ENMT: Mucous membranes are moist.  Neck: normal, supple Respiratory: clear to auscultation bilaterally, no wheezing, no crackles. Normal respiratory effort. No accessory muscle use.  Cardiovascular: Regular rate and rhythm, no murmurs / rubs / gallops.  1+ pitting edema Abdomen: non distended, no tenderness. Bowel sounds positive.  Musculoskeletal: no clubbing / cyanosis.  Skin: no rashes Neurologic: No focal deficits   Data Reviewed: I have independently reviewed following labs and imaging studies   CBC: Recent Labs  Lab 03/30/20 1746 03/31/20 0238 03/31/20 0600  WBC 3.4* 3.8* 4.0  HGB 12.0* 11.5* 11.5*  HCT 34.1* 32.8* 32.5*  MCV 88.8 88.4 87.1  PLT 210 197 [id number]   Basic Metabolic Panel: Recent Labs  Lab 03/31/20 0600 03/31/20 1028 04/02/20 2329 04/03/20 0545 04/03/20 1135 04/03/20 1809 04/04/20 0029  NA 120*   < > 125* 126* 129* 122* 123*  K 4.2   < > 4.6 4.5 4.5 4.2 4.5  CL 85*   < > 92* 93* 97* 90* 92*  CO2 23   < > 25 22 24 22  20*  GLUCOSE 158*   < > 138* 148* 131* 141* 158*  BUN 16   < > 19 20 18  22* 23*  CREATININE 1.35*   < > 1.52* 1.37* 1.43* 1.58* 1.52*  CALCIUM 8.3*   < > 8.5* 8.5* 8.4* 8.4* 8.4*  MG 1.5*  --   --   --   --   --   --   PHOS 3.4  --   --   --   --   --   --    < > = values in this interval not displayed.    Liver Function Tests: Recent Labs  Lab 03/31/20 0142  AST 35  ALT 23  ALKPHOS 57  BILITOT 0.9  PROT 7.1  ALBUMIN 3.1*   Coagulation Profile: No results for input(s): INR, PROTIME in the last 168 hours. HbA1C: No results for input(s): HGBA1C in the last 72 hours. CBG: Recent Labs  Lab 04/03/20 0727 04/03/20 1357 04/03/20 1610 04/03/20 2053 04/04/20 0722  GLUCAP 157* 154* 132* 228* 131*    Recent Results (from the past 240 hour(s))  Respiratory Panel by RT PCR (Flu A&B, Covid) -  Nasopharyngeal Swab     Status: None   Collection Time: 03/30/20 10:44 PM   Specimen: Nasopharyngeal Swab  Result Value Ref Range Status   SARS Coronavirus 2 by RT PCR NEGATIVE NEGATIVE Final    Comment: (NOTE) SARS-CoV-2 target nucleic acids are NOT DETECTED. The SARS-CoV-2 RNA is generally detectable in upper respiratoy specimens during the acute phase of infection. The lowest concentration of SARS-CoV-2 viral copies this assay can detect is 131 copies/mL. A negative result does not preclude SARS-Cov-2 infection and should not be used as the sole basis for treatment or other patient management decisions. A negative result may occur with  improper specimen collection/handling, submission of specimen other than nasopharyngeal swab, presence of viral mutation(s) within the areas targeted by this assay, and inadequate number of viral copies (<131 copies/mL). A negative result must be combined with clinical observations, patient history, and epidemiological information. The expected result is Negative. Fact Sheet for Patients:  PinkCheek.be Fact Sheet for Healthcare Providers:  GravelBags.it This test is not yet ap proved or cleared by the [place] FDA and  has been authorized for detection and/or diagnosis of SARS-CoV-2 by FDA under an Emergency Use Authorization (EUA). This EUA will remain  in effect (meaning this test can be  used) for the duration of the COVID-19 declaration under Section 564(b)(1) of the Act, 21 U.S.C. section 360bbb-3(b)(1), unless the authorization is terminated or revoked sooner.    Influenza A by PCR NEGATIVE NEGATIVE Final   Influenza B by PCR NEGATIVE NEGATIVE Final    Comment: (NOTE) The Xpert Xpress SARS-CoV-2/FLU/RSV assay is intended as an aid in  the diagnosis of influenza from Nasopharyngeal swab specimens and  should not be used as a sole basis for treatment. Nasal washings and  aspirates are unacceptable for Xpert Xpress SARS-CoV-2/FLU/RSV  testing. Fact Sheet for Patients: PinkCheek.be Fact Sheet for Healthcare Providers: GravelBags.it This test is not yet approved or cleared by the [place] FDA and  has been authorized for detection and/or diagnosis of SARS-CoV-2 by  FDA under an Emergency Use Authorization (EUA). This EUA will remain  in effect (meaning this test can be used) for the duration of the  Covid-19 declaration under Section 564(b)(1) of the Act, 21  U.S.C. section 360bbb-3(b)(1), unless the authorization is  terminated or revoked. Performed at [facility], [place] [address] [place], [place] [address]   MRSA PCR Screening     Status: None   Collection Time: 03/31/20  3:00 AM   Specimen: Nasal Mucosa; Nasopharyngeal  Result Value Ref Range Status   MRSA by PCR NEGATIVE NEGATIVE Final    Comment:        The GeneXpert MRSA Assay (FDA approved for NASAL specimens only), is one component of a comprehensive MRSA colonization surveillance program. It is not intended to diagnose MRSA infection nor to guide or monitor treatment for MRSA infections. Performed at [facility], [place] [address], [place], [place] [address]      Radiology Studies: ECHOCARDIOGRAM COMPLETE  Result Date: 04/03/2020    ECHOCARDIOGRAM REPORT   Patient Name:   Brad [name] Date of Exam: 04/03/2020 Medical Rec  #:  [id number]       Height:       71.0 in Accession #:    [id number]      Weight:       182.8 lb Date of Birth:  [birth year]      BSA:  2.029 m Patient Age:    49 years        BP:           116/76 mmHg Patient Gender: M               HR:           60 bpm. Exam Location:  Inpatient Procedure: 2D Echo, Cardiac Doppler and Color Doppler Indications:    Chest pain 486.50/R07.9  History:        Patient has prior history of Echocardiogram examinations, most                 recent 08/22/2019. CAD, Arrythmias:Atrial Fibrillation; Risk                 Factors:Diabetes, Current Smoker and Hypertension. Polysubstance                 abuse. CKD.  Sonographer:    Clayton Lefort RDCS (AE) Referring Phys: [id number] [place] IMPRESSIONS  1. Left ventricular ejection fraction, by estimation, is 35 to 40%. The left ventricle has moderately decreased function. The left ventricle demonstrates global hypokinesis. The left ventricular internal cavity size was mildly dilated. There is mild eccentric left ventricular hypertrophy. Left ventricular diastolic function could not be evaluated.  2. Right ventricular systolic function is moderately reduced. The right ventricular size is normal. Tricuspid regurgitation signal is inadequate for assessing PA pressure.  3. Left atrial size was severely dilated.  4. Right atrial size was severely dilated.  5. The mitral valve is normal in structure. Moderate to severe mitral valve regurgitation.  6. Tricuspid valve regurgitation is moderate.  7. The aortic valve is normal in structure. Aortic valve regurgitation is mild.  8. The inferior vena cava is dilated in size with <50% respiratory variability, suggesting right atrial pressure of 15 mmHg. Comparison(s): Prior images reviewed side by side. The left ventricular function has improved. The rhythm is now atrial fibrillation. The mitral insufficiency severity appears unchanged. FINDINGS  Left Ventricle: Left ventricular ejection fraction, by  estimation, is 35 to 40%. The left ventricle has moderately decreased function. The left ventricle demonstrates global hypokinesis. The left ventricular internal cavity size was mildly dilated. There is mild eccentric left ventricular hypertrophy. Left ventricular diastolic function could not be evaluated due to atrial fibrillation. Left ventricular diastolic function could not be evaluated. Right Ventricle: The right ventricular size is normal. No increase in right ventricular wall thickness. Right ventricular systolic function is moderately reduced. Tricuspid regurgitation signal is inadequate for assessing PA pressure. The tricuspid regurgitant velocity is 3.13 m/s, and with an assumed right atrial pressure of 15 mmHg, the estimated right ventricular systolic pressure is [id number] mmHg. Left Atrium: Left atrial size was severely dilated. Right Atrium: Right atrial size was severely dilated. Pericardium: Trivial pericardial effusion is present. Mitral Valve: The mitral valve is normal in structure. There is mild thickening of the mitral valve leaflet(s). Mild mitral annular calcification. Moderate to severe mitral valve regurgitation, with centrally-directed jet. MV peak gradient, 11.2 mmHg. The mean mitral valve gradient is 3.0 mmHg. Tricuspid Valve: The tricuspid valve is normal in structure. Tricuspid valve regurgitation is moderate. Aortic Valve: The aortic valve is normal in structure. Aortic valve regurgitation is mild. Aortic regurgitation PHT measures 973 msec. Aortic valve mean gradient measures 3.5 mmHg. Aortic valve peak gradient measures 6.9 mmHg. Aortic valve area, by VTI measures 1.82 cm. Pulmonic Valve: The pulmonic valve was normal in structure. Pulmonic valve regurgitation is mild.  Aorta: The aortic root is normal in size and structure. Venous: The inferior vena cava is dilated in size with less than 50% respiratory variability, suggesting right atrial pressure of 15 mmHg. IAS/Shunts: No atrial level  shunt detected by color flow Doppler.  LEFT VENTRICLE PLAX 2D LVIDd:         6.17 cm LVIDs:         4.98 cm LV PW:         1.43 cm LV IVS:        1.26 cm LVOT diam:     2.30 cm LV SV:         47 LV SV Index:   23 LVOT Area:     4.15 cm  LV Volumes (MOD) LV vol d, MOD A2C: 169.0 ml LV vol d, MOD A4C: 204.0 ml LV vol s, MOD A2C: 94.9 ml LV vol s, MOD A4C: 121.0 ml LV SV MOD A2C:     74.1 ml LV SV MOD A4C:     204.0 ml LV SV MOD BP:      76.8 ml RIGHT VENTRICLE            IVC RV Basal diam:  3.41 cm    IVC diam: 2.97 cm RV S prime:     6.37 cm/s TAPSE (M-mode): 1.4 cm LEFT ATRIUM              Index       RIGHT ATRIUM           Index LA diam:        4.70 cm  2.32 cm/m  RA Area:     29.70 cm LA Vol (A2C):   166.0 ml 81.80 ml/m RA Volume:   113.00 ml 55.68 ml/m LA Vol (A4C):   111.0 ml 54.70 ml/m LA Biplane Vol: 144.0 ml 70.96 ml/m  AORTIC VALVE AV Area (Vmax):    1.97 cm AV Area (Vmean):   1.74 cm AV Area (VTI):     1.82 cm AV Vmax:           131.50 cm/s AV Vmean:          88.850 cm/s AV VTI:            0.255 m AV Peak Grad:      6.9 mmHg AV Mean Grad:      3.5 mmHg LVOT Vmax:         62.50 cm/s LVOT Vmean:        37.300 cm/s LVOT VTI:          0.112 m LVOT/AV VTI ratio: 0.44 AI PHT:            973 msec  AORTA Ao Root diam: 3.70 cm Ao Asc diam:  3.70 cm MITRAL VALVE                 TRICUSPID VALVE MV Peak grad: 11.2 mmHg      TR Peak grad:   39.2 mmHg MV Mean grad: 3.0 mmHg       TR Vmax:        313.00 cm/s MV Vmax:      1.67 m/s MV Vmean:     67.4 cm/s      SHUNTS MR Peak grad:    107.7 mmHg  Systemic VTI:  0.11 m MR Mean grad:    70.0 mmHg   Systemic Diam: 2.30 cm MR Vmax:         519.00 cm/s MR  Vmean:        391.0 cm/s MR PISA:         3.08 cm MR PISA Eff ROA: 18 mm MR PISA Radius:  0.70 cm Mihai Croitoru MD Electronically signed by Sanda Klein MD Signature Date/Time: 04/03/2020/3:28:58 PM    Final    Marzetta Board, MD, PhD [facility]  Between 7 am - 7 pm I am available, please contact me  via Amion or Securechat  Between 7 pm - 7 am I am not available, please contact night coverage MD/APP via Amion

## 2020-04-04 NOTE — Progress Notes (Signed)
Patient HR dropped to low 40s per central tele-team. Dr Cruzita Lederer was informed via [place]. Dr. Bennett Scrape asked for the HR low setting to be 40 but below that, he wants to be informed.

## 2020-04-05 DIAGNOSIS — I5043 Acute on chronic combined systolic (congestive) and diastolic (congestive) heart failure: Secondary | ICD-10-CM | POA: Diagnosis not present

## 2020-04-05 DIAGNOSIS — E871 Hypo-osmolality and hyponatremia: Secondary | ICD-10-CM | POA: Diagnosis not present

## 2020-04-05 LAB — GLUCOSE, CAPILLARY
Glucose-Capillary: 160 mg/dL — ABNORMAL HIGH (ref 70–99)
Glucose-Capillary: 124 mg/dL — ABNORMAL HIGH (ref 70–99)
Glucose-Capillary: 144 mg/dL — ABNORMAL HIGH (ref 70–99)
Glucose-Capillary: 160 mg/dL — ABNORMAL HIGH (ref 70–99)

## 2020-04-05 LAB — BASIC METABOLIC PANEL
Anion gap: 12 (ref 5–15)
Anion gap: 11 (ref 5–15)
BUN: 27 mg/dL — ABNORMAL HIGH (ref 6–20)
BUN: 28 mg/dL — ABNORMAL HIGH (ref 6–20)
CO2: 21 mmol/L — ABNORMAL LOW (ref 22–32)
CO2: 22 mmol/L (ref 22–32)
Calcium: 8.4 mg/dL — ABNORMAL LOW (ref 8.9–10.3)
Calcium: 8.3 mg/dL — ABNORMAL LOW (ref 8.9–10.3)
Chloride: 86 mmol/L — ABNORMAL LOW (ref 98–111)
Chloride: 86 mmol/L — ABNORMAL LOW (ref 98–111)
Creatinine, Ser: 1.74 mg/dL — ABNORMAL HIGH (ref 0.61–1.24)
Creatinine, Ser: 1.82 mg/dL — ABNORMAL HIGH (ref 0.61–1.24)
GFR calc Af Amer: 48 mL/min — ABNORMAL LOW (ref >60)
GFR calc Af Amer: 46 mL/min — ABNORMAL LOW (ref >60)
GFR calc non Af Amer: 42 mL/min — ABNORMAL LOW (ref >60)
GFR calc non Af Amer: 39 mL/min — ABNORMAL LOW (ref >60)
Glucose, Bld: 159 mg/dL — ABNORMAL HIGH (ref 70–99)
Glucose, Bld: 143 mg/dL — ABNORMAL HIGH (ref 70–99)
Potassium: 4.6 mmol/L (ref 3.5–5.1)
Potassium: 4.6 mmol/L (ref 3.5–5.1)
Sodium: 119 mmol/L — CL (ref 135–145)
Sodium: 119 mmol/L — CL (ref 135–145)

## 2020-04-05 LAB — SODIUM: Sodium: 121 mmol/L — ABNORMAL LOW (ref 135–145)

## 2020-04-05 MED ORDER — SODIUM CHLORIDE 0.9 % IV SOLN: INTRAVENOUS | Status: DC

## 2020-04-05 MED ORDER — FUROSEMIDE 10 MG/ML IJ SOLN
60.0000 mg | Freq: Two times a day (BID) | INTRAMUSCULAR | Status: AC
  Administered 2020-04-05 – 2020-04-06 (×2): 60 mg via INTRAVENOUS
  Filled 2020-04-05 (×3): qty 6

## 2020-04-05 NOTE — Progress Notes (Signed)
PROGRESS NOTE  Brad [name] [id number] DOB: 1961/11/30 DOA: 03/30/2020 PCP: Charlott Rakes, MD   LOS: 5 days   Brief Narrative / Interim history: 59 year old male with alcohol abuse, prior CVA, DM, chronic systolic CHF, chronic kidney disease came to the hospital after falling at the store, was found to be hyponatremic and admitted to the ICU on 4/9.  He has a history of recurrent falls.  He also has a history of chronic hyponatremia with sodium ranging 125-130.  He was found to be in alcohol withdrawal and placed on Librium taper, transferred out of the ICU on 4/7.  Subjective / 24h Interval events: No complaints, wants to go home   Assessment & Plan: Principal Problem Hyponatremia, with underlying CHF and beer potomania -Suspected beer potomania.  He received fluids in the ED and then sodium corrected 111-117 and subsequently he was placed on D5 W to avoid overcorrection.  With IV fluids his sodium has improved and it was up to 129 however drifted down and now back to 119. He was placed on fluid restriction on 4/14 but not sure entirely about compliance last night.  -continue fluid restriction. He has edema however with hypochloremia and elevated BUN/Cr I wonder whether he is truly intravascularly depleted. No CVP available  -heart failure following.   Active Problems Chronic systolic CHF/PAF -TTE from 07/2019 shows EF 25%, LV diffuse hypokinesis, mod-severe MR, moderate TR, RA and LA dilation, moderate RV dysfunction, and trivial pericardial effusion.  Updated 2D echo on 4/13 showed an EF of 35-40% with global hypokinesis of the left ventricle -weight keeps going up, I have asked the patient again today to restrict water and stick to the fluid restriction. I am not sure he is fully compliant  Chest pain - likely atypical ekg afib, trop mild  Flat.  Resolved  Acute metabolic encephalopathy  -presents with confusion in the setting of alcohol intoxication acute on chronic  hyponatremia: finished librium  Headache/neck pain - from fall- ct head and ct  c spine on admission No acute findings-add tramadol prn.says tylenol not working.  Alcohol abuse with signs of withdrawal  -EtOH level 120 on admission. Withdrawal symptoms improving  Liver cirrhosis  -due to chronic hep C and alcoholic liver disease -Contributing to hyponatremia as well  AKI on CKD stage IIIa  -Baseline creatinine1.2-1.3.  Creatinine 1.5 and overall stable.  Resume diuretics and monitor  DM2-A1c 6.2 in March, diet controlled  Recurrent falls-PT consulted, will need home health at the time of discharge  Scheduled Meds: . amitriptyline  20 mg Oral QHS  . atorvastatin  20 mg Oral Daily  . carvedilol  3.125 mg Oral BID WC  . DULoxetine  60 mg Oral Daily  . folic acid  1 mg Oral Daily  . gabapentin  600 mg Oral Daily  . heparin  5,000 Units Subcutaneous Q8H  . insulin aspart  1-3 Units Subcutaneous TID WC  . isosorbide-hydrALAZINE  1 tablet Oral BID  . mirtazapine  15 mg Oral QHS  . spironolactone  25 mg Oral Daily  . thiamine  100 mg Oral Daily  . tolvaptan  15 mg Oral Q24H  . torsemide  40 mg Oral Daily   Continuous Infusions: PRN Meds:.acetaminophen, albuterol, docusate sodium, nitroGLYCERIN, polyethylene glycol, traMADol  DVT prophylaxis: SCDs Code Status: Full code Family Communication: no family at bedside  Patient admitted from: home  Anticipated d/c place: home Barriers to d/c: worsening hyponatremia  Consultants:  Cardiology  Procedures:  2D echo  Microbiology  None   Antimicrobials: None     Objective: Vitals:   04/04/20 2317 04/05/20 0500 04/05/20 0758 04/05/20 0800  BP: (!) 104/57   111/71  Pulse:      Resp: 16  14 17   Temp: (!) 97.5 F (36.4 C)     TempSrc: Oral     SpO2: 100%   100%  Weight:  89.1 kg    Height:        Intake/Output Summary (Last 24 hours) at 04/05/2020 1423 Last data filed at 04/05/2020 0400 Gross per 24 hour  Intake  445.75 ml  Output --  Net 445.75 ml   Filed Weights   04/03/20 0500 04/04/20 0500 04/05/20 0500  Weight: 82.9 kg 85.6 kg 89.1 kg    Examination:  Constitutional: nad, in bed Eyes: no icterus  ENMT: mmm Neck: normal, supple Respiratory: cta biL, no wheezing Cardiovascular: regular,  1+ pitting edema Abdomen: soft, NT, ND, BS+ Musculoskeletal: no clubbing / cyanosis.  Skin: no new rashes Neurologic: no new deficits   Data Reviewed: I have independently reviewed following labs and imaging studies   CBC: Recent Labs  Lab 03/30/20 1746 03/31/20 0238 03/31/20 0600  WBC 3.4* 3.8* 4.0  HGB 12.0* 11.5* 11.5*  HCT 34.1* 32.8* 32.5*  MCV 88.8 88.4 87.1  PLT 210 197 [id number]   Basic Metabolic Panel: Recent Labs  Lab 03/31/20 0600 03/31/20 1028 04/03/20 1809 04/04/20 0029 04/04/20 1624 04/05/20 0005 04/05/20 0748  NA 120*   < > 122* 123* 121* 119* 119*  K 4.2   < > 4.2 4.5 4.6 4.6 4.6  CL 85*   < > 90* 92* 88* 86* 86*  CO2 23   < > 22 20* 21* 21* 22  GLUCOSE 158*   < > 141* 158* 159* 159* 143*  BUN 16   < > 22* 23* 23* 27* 28*  CREATININE 1.35*   < > 1.58* 1.52* 1.52* 1.74* 1.82*  CALCIUM 8.3*   < > 8.4* 8.4* 8.4* 8.4* 8.3*  MG 1.5*  --   --   --   --   --   --   PHOS 3.4  --   --   --   --   --   --    < > = values in this interval not displayed.   Liver Function Tests: Recent Labs  Lab 03/31/20 0142  AST 35  ALT 23  ALKPHOS 57  BILITOT 0.9  PROT 7.1  ALBUMIN 3.1*   Coagulation Profile: No results for input(s): INR, PROTIME in the last 168 hours. HbA1C: No results for input(s): HGBA1C in the last 72 hours. CBG: Recent Labs  Lab 04/04/20 1208 04/04/20 1609 04/04/20 2121 04/05/20 0725 04/05/20 1123  GLUCAP 128* 179* 152* 160* 124*    Recent Results (from the past 240 hour(s))  Respiratory Panel by RT PCR (Flu A&B, Covid) - Nasopharyngeal Swab     Status: None   Collection Time: 03/30/20 10:44 PM   Specimen: Nasopharyngeal Swab  Result Value Ref  Range Status   SARS Coronavirus 2 by RT PCR NEGATIVE NEGATIVE Final    Comment: (NOTE) SARS-CoV-2 target nucleic acids are NOT DETECTED. The SARS-CoV-2 RNA is generally detectable in upper respiratoy specimens during the acute phase of infection. The lowest concentration of SARS-CoV-2 viral copies this assay can detect is 131 copies/mL. A negative result does not preclude SARS-Cov-2 infection and should not be used as the sole basis for treatment or other patient  management decisions. A negative result may occur with  improper specimen collection/handling, submission of specimen other than nasopharyngeal swab, presence of viral mutation(s) within the areas targeted by this assay, and inadequate number of viral copies (<131 copies/mL). A negative result must be combined with clinical observations, patient history, and epidemiological information. The expected result is Negative. Fact Sheet for Patients:  PinkCheek.be Fact Sheet for Healthcare Providers:  GravelBags.it This test is not yet ap proved or cleared by the [place] FDA and  has been authorized for detection and/or diagnosis of SARS-CoV-2 by FDA under an Emergency Use Authorization (EUA). This EUA will remain  in effect (meaning this test can be used) for the duration of the COVID-19 declaration under Section 564(b)(1) of the Act, 21 U.S.C. section 360bbb-3(b)(1), unless the authorization is terminated or revoked sooner.    Influenza A by PCR NEGATIVE NEGATIVE Final   Influenza B by PCR NEGATIVE NEGATIVE Final    Comment: (NOTE) The Xpert Xpress SARS-CoV-2/FLU/RSV assay is intended as an aid in  the diagnosis of influenza from Nasopharyngeal swab specimens and  should not be used as a sole basis for treatment. Nasal washings and  aspirates are unacceptable for Xpert Xpress SARS-CoV-2/FLU/RSV  testing. Fact Sheet for  Patients: PinkCheek.be Fact Sheet for Healthcare Providers: GravelBags.it This test is not yet approved or cleared by the [place] FDA and  has been authorized for detection and/or diagnosis of SARS-CoV-2 by  FDA under an Emergency Use Authorization (EUA). This EUA will remain  in effect (meaning this test can be used) for the duration of the  Covid-19 declaration under Section 564(b)(1) of the Act, 21  U.S.C. section 360bbb-3(b)(1), unless the authorization is  terminated or revoked. Performed at [facility], [place] [address], [place], [place] [address]   MRSA PCR Screening     Status: None   Collection Time: 03/31/20  3:00 AM   Specimen: Nasal Mucosa; Nasopharyngeal  Result Value Ref Range Status   MRSA by PCR NEGATIVE NEGATIVE Final    Comment:        The GeneXpert MRSA Assay (FDA approved for NASAL specimens only), is one component of a comprehensive MRSA colonization surveillance program. It is not intended to diagnose MRSA infection nor to guide or monitor treatment for MRSA infections. Performed at [facility], [place] [address], [place], [place] [address]      Radiology Studies: No results found. Marzetta Board, MD, PhD [facility]  Between 7 am - 7 pm I am available, please contact me via Amion or Securechat  Between 7 pm - 7 am I am not available, please contact night coverage MD/APP via Amion

## 2020-04-05 NOTE — Progress Notes (Signed)
Occupational Therapy Treatment Patient Details Name: Brad [name] MRN: [id number] DOB: 22-Sep-1961 Today's Date: 04/05/2020    History of present illness 59 year old male history of alcohol abuse, CVA, diabetes mellitus, HFrEF, CKD who fell at the store and was brought to the ER and found to have profound hyponatremia and was admitted to ICU 03/30/20. Patient has history of recurrent fall, recurrent admission.  He has history of chronic hyponatremia.    OT comments  Pt making progress in therapy, demonstrating improved independence with self-care and mobility tasks. Educated/instructed pt on safety strategies, fall prevention, and activity pacing techniques to increase balance with fair understanding and follow through. Pt able to ambulate to/from bathroom with cane and variable min guard to min assist for balance. Pt completed toileting task with min guard and cues for safety. Pt has tub/shower at home. Worked first on safety with walk-in shower transfer with hopes to progress to tub/shower transfer as strength and balance improves. Pt able to complete walk-in shower transfer x 2 with min assist for balance and safety. Pt tolerated standing additional 1 x 5 min at the sink to complete grooming/hygiene tasks. Pt setup in chair for lunch. All questions/concerns answered at this time. OT will continue to follow acutely.    Follow Up Recommendations  Home health OT;Supervision - Intermittent    Equipment Recommendations  Tub/shower seat    Recommendations for Other Services      Precautions / Restrictions Precautions Precautions: Fall Restrictions Weight Bearing Restrictions: No       Mobility Bed Mobility Overal bed mobility: Modified Independent             General bed mobility comments: HOB flat. Use of bedrails  Transfers Overall transfer level: Needs assistance Equipment used: Straight cane Transfers: Sit to/from Stand Sit to Stand: Min guard         General transfer  comment: Cues for balance and safety    Balance Overall balance assessment: Mild deficits observed, not formally tested                                         ADL either performed or assessed with clinical judgement   ADL Overall ADL's : Needs assistance/impaired Eating/Feeding: Independent;Sitting   Grooming: Wash/dry hands;Wash/dry face;Oral care;Set up;Supervision/safety;Standing Grooming Details (indicate cue type and reason): While standing at the sink. Heavy lean on sink for balance and support                 Toilet Transfer: Min guard;Ambulation;Grab bars   Toileting- Clothing Manipulation and Hygiene: Min guard;Sit to/from stand   Tub/ Shower Transfer: Walk-in shower;Minimal assistance;Grab Paediatric nurse Details (indicate cue type and reason): Pt has tub/shower at home. Worked first on safety with walk-in shower transfer with hopes to progress to tub/shower transfer as strength and balance improves.  Functional mobility during ADLs: Min guard;Minimal assistance;Cane General ADL Comments: Pt able to ambulate around room with cane and variable min guard to min assist for balance and safety. Pt tolerated standing 1 x 5 min at the sink to complete grooming/hygiene tasks.      Vision       Perception     Praxis      Cognition Arousal/Alertness: Awake/alert Behavior During Therapy: WFL for tasks assessed/performed Overall Cognitive Status: Impaired/Different from baseline Area of Impairment: Safety/judgement  Safety/Judgement: Decreased awareness of safety;Decreased awareness of deficits     General Comments: Pt pleasant and willing to participate in therapy. Cues for safety throughout.         Exercises     Shoulder Instructions       General Comments No signs/symptoms of distress    Pertinent Vitals/ Pain       Pain Assessment: No/denies pain  Home Living                                           Prior Functioning/Environment              Frequency           Progress Toward Goals  OT Goals(current goals can now be found in the care plan section)  Progress towards OT goals: Progressing toward goals  ADL Goals Pt Will Perform Grooming: with modified independence;standing Pt Will Perform Lower Body Bathing: with modified independence;sit to/from stand Pt Will Perform Upper Body Dressing: with modified independence;sitting;standing Pt Will Perform Lower Body Dressing: with modified independence;sit to/from stand Pt Will Transfer to Toilet: with modified independence;ambulating Pt Will Perform Toileting - Clothing Manipulation and hygiene: with modified independence;sit to/from stand Pt Will Perform Tub/Shower Transfer: Shower transfer;with modified independence;rolling walker;grab bars Additional ADL Goal #1: Pt will gather items necessary for ADL around his room modified independently.  Plan Discharge plan remains appropriate    Co-evaluation                 AM-PAC OT "6 Clicks" Daily Activity     Outcome Measure   Help from another person eating meals?: None Help from another person taking care of personal grooming?: A Little Help from another person toileting, which includes using toliet, bedpan, or urinal?: A Little Help from another person bathing (including washing, rinsing, drying)?: A Little Help from another person to put on and taking off regular upper body clothing?: None Help from another person to put on and taking off regular lower body clothing?: A Little 6 Click Score: 20    End of Session Equipment Utilized During Treatment: Gait belt  OT Visit Diagnosis: Unsteadiness on feet (R26.81);Other abnormalities of gait and mobility (R26.89)   Activity Tolerance Patient tolerated treatment well   Patient Left in chair;with call bell/phone within reach;with chair alarm set   Nurse Communication Mobility status         Time: [id number] OT Time Calculation (min): 23 min  Charges: OT General Charges $OT Visit: 1 Visit OT Treatments $Therapeutic Activity: 23-37 mins  Mauri Brooklyn OTR/L [phone]   Mauri Brooklyn 04/05/2020, 12:49 PM

## 2020-04-05 NOTE — Progress Notes (Addendum)
Advanced Heart Failure Rounding Note  PCP-Cardiologist: [name], MD   Subjective:    Sodium 129>122>123>119   Received tolvaptan 4/14   Denies SOB.   Objective:   Weight Range: 89.1 kg Body mass index is 27.4 kg/m.   Vital Signs:   Temp:  [97.5 F (36.4 C)-97.7 F (36.5 C)] 97.5 F (36.4 C) (04/14 2317) Pulse Rate:  [57] 57 (04/14 1600) Resp:  [14-19] 17 (04/15 0800) BP: (104-136)/(57-76) 111/71 (04/15 0800) SpO2:  [97 %-100 %] 100 % (04/15 0800) Weight:  [89.1 kg] 89.1 kg (04/15 0500) Last BM Date: 04/03/20  Weight change: Filed Weights   04/03/20 0500 04/04/20 0500 04/05/20 0500  Weight: 82.9 kg 85.6 kg 89.1 kg    Intake/Output:   Intake/Output Summary (Last 24 hours) at 04/05/2020 1248 Last data filed at 04/05/2020 0400 Gross per 24 hour  Intake 445.75 ml  Output --  Net 445.75 ml      Physical Exam   General:  Sitting in the chair. No resp difficulty HEENT: normal Neck: supple. JVP 11-12. Carotids 2+ bilat; no bruits. No lymphadenopathy or thryomegaly appreciated. Cor: PMI nondisplaced. Irregular rate & rhythm. No rubs, gallops or murmurs. Lungs: clear Abdomen: soft, nontender, nondistended. No hepatosplenomegaly. No bruits or masses. Good bowel sounds. Extremities: no cyanosis, clubbing, rash, R and LLE 1+ edema Neuro: alert & orientedx3, cranial nerves grossly intact. moves all 4 extremities w/o difficulty. Affect pleasant   Telemetry   A fib 50s   EKG   n/a  Labs    CBC No results for input(s): WBC, NEUTROABS, HGB, HCT, MCV, PLT in the last 72 hours. Basic Metabolic Panel Recent Labs    04/05/20 0005 04/05/20 0748  NA 119* 119*  K 4.6 4.6  CL 86* 86*  CO2 21* 22  GLUCOSE 159* 143*  BUN 27* 28*  CREATININE 1.74* 1.82*  CALCIUM 8.4* 8.3*   Liver Function Tests No results for input(s): AST, ALT, ALKPHOS, BILITOT, PROT, ALBUMIN in the last 72 hours. No results for input(s): LIPASE, AMYLASE in the last 72  hours. Cardiac Enzymes No results for input(s): CKTOTAL, CKMB, CKMBINDEX, TROPONINI in the last 72 hours.  BNP: BNP (last 3 results) Recent Labs    01/01/20 0027 01/10/20 1622 01/16/20 1210  BNP 1,985.9* 1,118.5* 476.0*    ProBNP (last 3 results) No results for input(s): PROBNP in the last 8760 hours.   D-Dimer No results for input(s): DDIMER in the last 72 hours. Hemoglobin A1C No results for input(s): HGBA1C in the last 72 hours. Fasting Lipid Panel No results for input(s): CHOL, HDL, LDLCALC, TRIG, CHOLHDL, LDLDIRECT in the last 72 hours. Thyroid Function Tests No results for input(s): TSH, T4TOTAL, T3FREE, THYROIDAB in the last 72 hours.  Invalid input(s): FREET3  Other results:   Imaging    No results found.   Medications:     Scheduled Medications: . amitriptyline  20 mg Oral QHS  . atorvastatin  20 mg Oral Daily  . carvedilol  3.125 mg Oral BID WC  . DULoxetine  60 mg Oral Daily  . folic acid  1 mg Oral Daily  . gabapentin  600 mg Oral Daily  . heparin  5,000 Units Subcutaneous Q8H  . insulin aspart  1-3 Units Subcutaneous TID WC  . isosorbide-hydrALAZINE  1 tablet Oral BID  . mirtazapine  15 mg Oral QHS  . spironolactone  25 mg Oral Daily  . thiamine  100 mg Oral Daily  . tolvaptan  15 mg Oral Q24H  . torsemide  40 mg Oral Daily    Infusions:   PRN Medications: acetaminophen, albuterol, docusate sodium, nitroGLYCERIN, polyethylene glycol, traMADol     Assessment/Plan   1. Syncope Due to ETOH/Intoxication .   2. Hyponatremia  - Sodium 111 on admit.  - Received tolvaptan 4/14 . Given another dose today.  - Sodium trending down 119 today.  - Continue to limit free water.   3. Biventricular HF  EF 07/2019 EF 25-30% Moderately reduced RV - Volume status trending up. Given torsemide 40 mg daily + 25 mg spironolactone daily. - Continue carvedilol 3.125 mg twice a day. No room to increase with heart rate in the 50s.  - Continue bidil 1  tab three times a day   4. A fib  -Rate controlled. in the 69s -Has been in A fib for several months. Off eliquis with multiple falls.  -Continue carvedilol 3.125 mg twice a day.   5. ETOH Abuse Discussed cessation.   Length of Stay: 5  Amy Clegg, NP  04/05/2020, 12:48 PM  Advanced Heart Failure Team Pager [phone] (M-F; 7a - 4p)  Please contact [facility] for night-coverage after hours (4p -7a ) and weekends on [email or web address]  Patient seen with NP, agree with the above note.   Na still low today at 119.  No complaints.   General: NAD Neck: JVP 12-14 cm, no thyromegaly or thyroid nodule.  Lungs: Clear to auscultation bilaterally with normal respiratory effort. CV: Lateral PMI.  Heart regular S1/S2, no S3/S4, no murmur.  1+ ankle edema.  No carotid bruit.   Abdomen: Soft, nontender, no hepatosplenomegaly, no distention.  Skin: Intact without lesions or rashes.  Neurologic: Alert and oriented x 3.  Psych: Normal affect. Extremities: No clubbing or cyanosis.  HEENT: Normal.   Suspect hypervolemic hyponatremia.  - Will give tolvaptan again today at higher dose.  - Will start IV Lasix for now, 60 mg IV bid with volume overload.  - Strict fluid restriction.   Loralie Champagne 04/05/2020 5:08 PM

## 2020-04-05 NOTE — Progress Notes (Signed)
Physical Therapy Treatment Patient Details Name: Brad [name] MRN: [id number] DOB: 05/19/1961 Today's Date: 04/05/2020    History of Present Illness 59 year old male history of alcohol abuse, CVA, diabetes mellitus, HFrEF, CKD who fell at the store and was brought to the ER and found to have profound hyponatremia and was admitted to ICU 03/30/20. Patient has history of recurrent fall, recurrent admission.  He has history of chronic hyponatremia.     PT Comments    Pt willing today, but was fatigued with noticeable difficulty clearing his steps and degrading to shuffle with fatigue.    Follow Up Recommendations  Other (comment);Home health PT;Supervision - Intermittent     Equipment Recommendations  None recommended by PT    Recommendations for Other Services       Precautions / Restrictions Precautions Precautions: Fall    Mobility  Bed Mobility Overal bed mobility: Modified Independent                Transfers Overall transfer level: Needs assistance Equipment used: Straight cane Transfers: Sit to/from Stand Sit to Stand: Min guard         General transfer comment: pt is min guard for safety and he takes effort to power up which is different that yesterday.  Likely due to low sodium today.  Ambulation/Gait Ambulation/Gait assistance: Min guard Gait Distance (Feet): 100 Feet(x2 with 1 rest break leaning heavily on the nursing desk) Assistive device: Straight cane Gait Pattern/deviations: Step-through pattern Gait velocity: dec Gait velocity interpretation: <1.8 ft/sec, indicate of risk for recurrent falls General Gait Details: Safe use of the cane, but today some unsteadiness and very low amplitude /shuffled steps.   Stairs             Wheelchair Mobility    Modified [name] (Stroke Patients Only)       Balance                                            Cognition Arousal/Alertness: Awake/alert Behavior During Therapy:  WFL for tasks assessed/performed Overall Cognitive Status: Within Functional Limits for tasks assessed                                 General Comments: Pt pleasant and willing to participate in therapy. Cues for safety throughout.       Exercises      General Comments        Pertinent Vitals/Pain Pain Assessment: No/denies pain    Home Living                      Prior Function            PT Goals (current goals can now be found in the care plan section) Acute Rehab PT Goals PT Goal Formulation: With patient Time For Goal Achievement: 04/07/20 Potential to Achieve Goals: Good Progress towards PT goals: Progressing toward goals    Frequency    Min 3X/week      PT Plan Current plan remains appropriate    Co-evaluation              AM-PAC PT "6 Clicks" Mobility   Outcome Measure  Help needed turning from your back to your side while in a flat bed without using bedrails?: None Help needed moving from lying  on your back to sitting on the side of a flat bed without using bedrails?: None Help needed moving to and from a bed to a chair (including a wheelchair)?: A Little Help needed standing up from a chair using your arms (e.g., wheelchair or bedside chair)?: A Little Help needed to walk in hospital room?: A Little Help needed climbing 3-5 steps with a railing? : A Lot 6 Click Score: 19    End of Session   Activity Tolerance: Patient tolerated treatment well;Patient limited by fatigue Patient left: in bed;with call bell/phone within reach;with bed alarm set Nurse Communication: Mobility status PT Visit Diagnosis: Other abnormalities of gait and mobility (R26.89);Muscle weakness (generalized) (M62.81)     Time: [id number] PT Time Calculation (min) (ACUTE ONLY): 17 min  Charges:  $Gait Training: 8-22 mins                     04/05/2020  Ginger Carne., PT Acute Rehabilitation Services [phone]  (pager) [phone]   (office)   [name] Azriella Mattia 04/05/2020, 4:45 PM

## 2020-04-06 DIAGNOSIS — I5043 Acute on chronic combined systolic (congestive) and diastolic (congestive) heart failure: Secondary | ICD-10-CM | POA: Diagnosis not present

## 2020-04-06 DIAGNOSIS — E871 Hypo-osmolality and hyponatremia: Secondary | ICD-10-CM | POA: Diagnosis not present

## 2020-04-06 LAB — CBC
HCT: 30.8 % — ABNORMAL LOW (ref 39.0–52.0)
Hemoglobin: 10.3 g/dL — ABNORMAL LOW (ref 13.0–17.0)
MCH: 30.8 pg (ref 26.0–34.0)
MCHC: 33.4 g/dL (ref 30.0–36.0)
MCV: 92.2 fL (ref 80.0–100.0)
Platelets: 173 10*3/uL (ref 150–400)
RBC: 3.34 MIL/uL — ABNORMAL LOW (ref 4.22–5.81)
RDW: 13.8 % (ref 11.5–15.5)
WBC: 3.7 10*3/uL — ABNORMAL LOW (ref 4.0–10.5)
nRBC: 0 % (ref 0.0–0.2)

## 2020-04-06 LAB — GLUCOSE, CAPILLARY
Glucose-Capillary: 148 mg/dL — ABNORMAL HIGH (ref 70–99)
Glucose-Capillary: 149 mg/dL — ABNORMAL HIGH (ref 70–99)
Glucose-Capillary: 139 mg/dL — ABNORMAL HIGH (ref 70–99)

## 2020-04-06 LAB — COMPREHENSIVE METABOLIC PANEL
ALT: 22 U/L (ref 0–44)
AST: 30 U/L (ref 15–41)
Albumin: 2.8 g/dL — ABNORMAL LOW (ref 3.5–5.0)
Alkaline Phosphatase: 63 U/L (ref 38–126)
Anion gap: 11 (ref 5–15)
BUN: 38 mg/dL — ABNORMAL HIGH (ref 6–20)
CO2: 22 mmol/L (ref 22–32)
Calcium: 8.5 mg/dL — ABNORMAL LOW (ref 8.9–10.3)
Chloride: 92 mmol/L — ABNORMAL LOW (ref 98–111)
Creatinine, Ser: 1.94 mg/dL — ABNORMAL HIGH (ref 0.61–1.24)
GFR calc Af Amer: 42 mL/min — ABNORMAL LOW (ref >60)
GFR calc non Af Amer: 37 mL/min — ABNORMAL LOW (ref >60)
Glucose, Bld: 148 mg/dL — ABNORMAL HIGH (ref 70–99)
Potassium: 4.8 mmol/L (ref 3.5–5.1)
Sodium: 125 mmol/L — ABNORMAL LOW (ref 135–145)
Total Bilirubin: 0.2 mg/dL — ABNORMAL LOW (ref 0.3–1.2)
Total Protein: 6.4 g/dL — ABNORMAL LOW (ref 6.5–8.1)

## 2020-04-06 MED ORDER — TOLVAPTAN 15 MG PO TABS
15.0000 mg | ORAL_TABLET | Freq: Once | ORAL | Status: AC
  Administered 2020-04-06: 15 mg via ORAL
  Filled 2020-04-06: qty 1

## 2020-04-06 MED ORDER — FUROSEMIDE 10 MG/ML IJ SOLN
80.0000 mg | Freq: Two times a day (BID) | INTRAMUSCULAR | Status: DC
  Administered 2020-04-06 – 2020-04-08 (×4): 80 mg via INTRAVENOUS
  Filled 2020-04-06 (×4): qty 8

## 2020-04-06 NOTE — Progress Notes (Signed)
PROGRESS NOTE  Brad [name] [id number] DOB: 15-Aug-1961 DOA: 03/30/2020 PCP: Charlott Rakes, MD   LOS: 6 days   Brief Narrative / Interim history: 59 year old male with alcohol abuse, prior CVA, DM, chronic systolic CHF, chronic kidney disease came to the hospital after falling at the store, was found to be hyponatremic and admitted to the ICU on 4/9.  He has a history of recurrent falls.  He also has a history of chronic hyponatremia with sodium ranging 125-130.  He was found to be in alcohol withdrawal and placed on Librium taper, transferred out of the ICU on 4/7.  Subjective / 24h Interval events: Adamant about going home, his son is visiting and will only be here for 2 more days  Assessment & Plan: Principal Problem Hyponatremia, with underlying CHF and beer potomania -Suspected beer potomania.  He received fluids in the ED and then sodium corrected 111-117 and subsequently he was placed on D5 W to avoid overcorrection.  With IV fluids his sodium has improved and it was up to 129 however drifted down again due to excessive water intake while in the hospital.  He was placed on tolvaptan along with fluid restriction and sodium is improving to 125.  Heart failure team following  Active Problems Chronic systolic CHF/PAF -TTE from 07/2019 shows EF 25%, LV diffuse hypokinesis, mod-severe MR, moderate TR, RA and LA dilation, moderate RV dysfunction, and trivial pericardial effusion.  Updated 2D echo on 4/13 showed an EF of 35-40% with global hypokinesis of the left ventricle -Weight on admission around 180-182, went as high as 196 and then down to 192 today  Chest pain - likely atypical ekg afib, trop mild  Flat.  Resolved  Acute metabolic encephalopathy  -presents with confusion in the setting of alcohol intoxication acute on chronic hyponatremia: finished librium  Headache/neck pain - from fall- ct head and ct  c spine on admission No acute findings-add tramadol prn.says tylenol  not working.  Alcohol abuse with signs of withdrawal  -EtOH level 120 on admission. Withdrawal symptoms improving  Liver cirrhosis  -due to chronic hep C and alcoholic liver disease -Contributing to hyponatremia as well  AKI on CKD stage IIIa  -Baseline creatinine1.2-1.3.  Creatinine gradually increasing, Lasix was discontinued today but did receive this morning's dose.  He is on spironolactone currently  DM2-A1c 6.2 in March, diet controlled  Recurrent falls-PT consulted, will need home health at the time of discharge  Scheduled Meds: . amitriptyline  20 mg Oral QHS  . atorvastatin  20 mg Oral Daily  . carvedilol  3.125 mg Oral BID WC  . DULoxetine  60 mg Oral Daily  . folic acid  1 mg Oral Daily  . gabapentin  600 mg Oral Daily  . heparin  5,000 Units Subcutaneous Q8H  . insulin aspart  1-3 Units Subcutaneous TID WC  . isosorbide-hydrALAZINE  1 tablet Oral BID  . mirtazapine  15 mg Oral QHS  . spironolactone  25 mg Oral Daily  . thiamine  100 mg Oral Daily   Continuous Infusions: PRN Meds:.acetaminophen, albuterol, docusate sodium, nitroGLYCERIN, polyethylene glycol, traMADol  DVT prophylaxis: SCDs Code Status: Full code Family Communication: no family at bedside  Patient admitted from: home  Anticipated d/c place: home Barriers to d/c: Persistent hyponatremia  Consultants:  Cardiology  Procedures:  2D echo  Microbiology  None   Antimicrobials: None     Objective: Vitals:   04/06/20 0500 04/06/20 0714 04/06/20 0844 04/06/20 0940  BP:  132/75  Pulse:  (!) 58    Resp:  16 16 16   Temp:  97.9 F (36.6 C)    TempSrc:      SpO2:      Weight: 87.2 kg     Height:       No intake or output data in the 24 hours ending 04/06/20 1428 Filed Weights   04/04/20 0500 04/05/20 0500 04/06/20 0500  Weight: 85.6 kg 89.1 kg 87.2 kg    Examination:  Constitutional: No distress, in bed Eyes: No scleral icterus ENMT: Moist mucous membranes Neck: normal,  supple Respiratory: Diminished at the bases but overall clear without wheezing Cardiovascular: Regular, 1+ pitting edema Abdomen: Soft, nondistended, bowel sounds positive Musculoskeletal: no clubbing / cyanosis.  Skin: No rashes Neurologic: No deficits, ambulatory   Data Reviewed: I have independently reviewed following labs and imaging studies   CBC: Recent Labs  Lab 03/30/20 1746 03/31/20 0238 03/31/20 0600 04/06/20 0208  WBC 3.4* 3.8* 4.0 3.7*  HGB 12.0* 11.5* 11.5* 10.3*  HCT 34.1* 32.8* 32.5* 30.8*  MCV 88.8 88.4 87.1 92.2  PLT 210 197 186 [id number]   Basic Metabolic Panel: Recent Labs  Lab 03/31/20 0600 03/31/20 1028 04/04/20 0029 04/04/20 0029 04/04/20 1624 04/05/20 0005 04/05/20 0748 04/05/20 1605 04/06/20 0208  NA 120*   < > 123*   < > 121* 119* 119* 121* 125*  K 4.2   < > 4.5  --  4.6 4.6 4.6  --  4.8  CL 85*   < > 92*  --  88* 86* 86*  --  92*  CO2 23   < > 20*  --  21* 21* 22  --  22  GLUCOSE 158*   < > 158*  --  159* 159* 143*  --  148*  BUN 16   < > 23*  --  23* 27* 28*  --  38*  CREATININE 1.35*   < > 1.52*  --  1.52* 1.74* 1.82*  --  1.94*  CALCIUM 8.3*   < > 8.4*  --  8.4* 8.4* 8.3*  --  8.5*  MG 1.5*  --   --   --   --   --   --   --   --   PHOS 3.4  --   --   --   --   --   --   --   --    < > = values in this interval not displayed.   Liver Function Tests: Recent Labs  Lab 03/31/20 0142 04/06/20 0208  AST 35 30  ALT 23 22  ALKPHOS 57 63  BILITOT 0.9 0.2*  PROT 7.1 6.4*  ALBUMIN 3.1* 2.8*   Coagulation Profile: No results for input(s): INR, PROTIME in the last 168 hours. HbA1C: No results for input(s): HGBA1C in the last 72 hours. CBG: Recent Labs  Lab 04/05/20 1123 04/05/20 1528 04/05/20 2049 04/06/20 0716 04/06/20 1211  GLUCAP 124* 144* 160* 148* 149*    Recent Results (from the past 240 hour(s))  Respiratory Panel by RT PCR (Flu A&B, Covid) - Nasopharyngeal Swab     Status: None   Collection Time: 03/30/20 10:44 PM    Specimen: Nasopharyngeal Swab  Result Value Ref Range Status   SARS Coronavirus 2 by RT PCR NEGATIVE NEGATIVE Final    Comment: (NOTE) SARS-CoV-2 target nucleic acids are NOT DETECTED. The SARS-CoV-2 RNA is generally detectable in upper respiratoy specimens during the acute phase of infection. The lowest  concentration of SARS-CoV-2 viral copies this assay can detect is 131 copies/mL. A negative result does not preclude SARS-Cov-2 infection and should not be used as the sole basis for treatment or other patient management decisions. A negative result may occur with  improper specimen collection/handling, submission of specimen other than nasopharyngeal swab, presence of viral mutation(s) within the areas targeted by this assay, and inadequate number of viral copies (<131 copies/mL). A negative result must be combined with clinical observations, patient history, and epidemiological information. The expected result is Negative. Fact Sheet for Patients:  PinkCheek.be Fact Sheet for Healthcare Providers:  GravelBags.it This test is not yet ap proved or cleared by the [place] FDA and  has been authorized for detection and/or diagnosis of SARS-CoV-2 by FDA under an Emergency Use Authorization (EUA). This EUA will remain  in effect (meaning this test can be used) for the duration of the COVID-19 declaration under Section 564(b)(1) of the Act, 21 U.S.C. section 360bbb-3(b)(1), unless the authorization is terminated or revoked sooner.    Influenza A by PCR NEGATIVE NEGATIVE Final   Influenza B by PCR NEGATIVE NEGATIVE Final    Comment: (NOTE) The Xpert Xpress SARS-CoV-2/FLU/RSV assay is intended as an aid in  the diagnosis of influenza from Nasopharyngeal swab specimens and  should not be used as a sole basis for treatment. Nasal washings and  aspirates are unacceptable for Xpert Xpress SARS-CoV-2/FLU/RSV  testing. Fact Sheet  for Patients: PinkCheek.be Fact Sheet for Healthcare Providers: GravelBags.it This test is not yet approved or cleared by the [place] FDA and  has been authorized for detection and/or diagnosis of SARS-CoV-2 by  FDA under an Emergency Use Authorization (EUA). This EUA will remain  in effect (meaning this test can be used) for the duration of the  Covid-19 declaration under Section 564(b)(1) of the Act, 21  U.S.C. section 360bbb-3(b)(1), unless the authorization is  terminated or revoked. Performed at [facility], [place] [address] [place], [place] [address]   MRSA PCR Screening     Status: None   Collection Time: 03/31/20  3:00 AM   Specimen: Nasal Mucosa; Nasopharyngeal  Result Value Ref Range Status   MRSA by PCR NEGATIVE NEGATIVE Final    Comment:        The GeneXpert MRSA Assay (FDA approved for NASAL specimens only), is one component of a comprehensive MRSA colonization surveillance program. It is not intended to diagnose MRSA infection nor to guide or monitor treatment for MRSA infections. Performed at [facility], [place] [address], [place], [place] [address]      Radiology Studies: No results found. Marzetta Board, MD, PhD [facility]  Between 7 am - 7 pm I am available, please contact me via Amion or Securechat  Between 7 pm - 7 am I am not available, please contact night coverage MD/APP via Amion

## 2020-04-06 NOTE — Progress Notes (Signed)
Physical Therapy Treatment Patient Details Name: Suryansh Craigen MRN: [id number] DOB: [birth year] Today's Date: 04/06/2020    History of Present Illness 59 year old male history of alcohol abuse, CVA, diabetes mellitus, HFrEF, CKD who fell at the store and was brought to the ER and found to have profound hyponatremia and was admitted to ICU 03/30/20. Patient has history of recurrent fall, recurrent admission.  He has history of chronic hyponatremia.     PT Comments    Pt continues to move slower and more guarded than a couple of sessions ago.  BERG score of 36 suggests higher fall risk and pt's gait is presently shuffled and progressively fatigued.  Still pt uses the cane well and negotiated the stairs well all with the cane.    Follow Up Recommendations  Other (comment);Home health PT;Supervision - Intermittent     Equipment Recommendations  None recommended by PT    Recommendations for Other Services       Precautions / Restrictions Precautions Precautions: Fall    Mobility  Bed Mobility Overal bed mobility: Modified Independent                Transfers Overall transfer level: Needs assistance Equipment used: Straight cane Transfers: Sit to/from Stand Sit to Stand: Min guard         General transfer comment: pt showed the needed to use hands to stand  Ambulation/Gait Ambulation/Gait assistance: Min guard Gait Distance (Feet): 300 Feet Assistive device: Straight cane Gait Pattern/deviations: Step-through pattern Gait velocity: dec Gait velocity interpretation: <1.8 ft/sec, indicate of risk for recurrent falls General Gait Details: Safe use of the cane, but today still shuffled L LE then R LE as he fatigued   Stairs Stairs: Yes Stairs assistance: Min guard Stair Management: One rail Right;Forwards;Step to pattern;With cane Number of Stairs: 4 General stair comments: pt negotiated steps as if his R LE was the weakest.  Safe with use of the  rail   Wheelchair Mobility    Modified [name] (Stroke Patients Only)       Balance                                 Standardized Balance Assessment Standardized Balance Assessment : Berg Balance Test Berg Balance Test Sit to Stand: Able to stand  independently using hands Standing Unsupported: Able to stand 2 minutes with supervision Sitting with Back Unsupported but Feet Supported on Floor or Stool: Able to sit safely and securely 2 minutes Stand to Sit: Sits safely with minimal use of hands Transfers: Able to transfer safely, definite need of hands Standing Unsupported with Eyes Closed: Able to stand 10 seconds with supervision Standing Ubsupported with Feet Together: Able to place feet together independently and stand for 1 minute with supervision From Standing, Reach Forward with Outstretched Arm: Can reach forward >12 cm safely (5") From Standing Position, Pick up Object from Floor: Unable to pick up shoe, but reaches 2-5 cm (1-2") from shoe and balances independently From Standing Position, Turn to Look Behind Over each Shoulder: Looks behind one side only/other side shows less weight shift Turn 360 Degrees: Able to turn 360 degrees safely but slowly Standing Unsupported, Alternately Place Feet on Step/Stool: Needs assistance to keep from falling or unable to try Standing Unsupported, One Foot in Front: Able to take small step independently and hold 30 seconds Standing on One Leg: Tries to lift leg/unable to hold 3 seconds but remains standing  independently Total Score: 36        Cognition Arousal/Alertness: Awake/alert Behavior During Therapy: WFL for tasks assessed/performed Overall Cognitive Status: Within Functional Limits for tasks assessed                                 General Comments: Pt pleasant and willing to participate in therapy. Cues for safety throughout.       Exercises      General Comments General comments (skin  integrity, edema, etc.): BERG suggest high risk of falling      Pertinent Vitals/Pain Pain Assessment: Faces Faces Pain Scale: No hurt    Home Living                      Prior Function            PT Goals (current goals can now be found in the care plan section) Acute Rehab PT Goals PT Goal Formulation: With patient Time For Goal Achievement: 04/07/20 Potential to Achieve Goals: Good Progress towards PT goals: Progressing toward goals    Frequency    Min 3X/week      PT Plan Current plan remains appropriate    Co-evaluation              AM-PAC PT "6 Clicks" Mobility   Outcome Measure  Help needed turning from your back to your side while in a flat bed without using bedrails?: None Help needed moving from lying on your back to sitting on the side of a flat bed without using bedrails?: None Help needed moving to and from a bed to a chair (including a wheelchair)?: A Little Help needed standing up from a chair using your arms (e.g., wheelchair or bedside chair)?: A Little Help needed to walk in hospital room?: A Little Help needed climbing 3-5 steps with a railing? : A Little 6 Click Score: 20    End of Session   Activity Tolerance: Patient tolerated treatment well;Patient limited by fatigue Patient left: in bed;with call bell/phone within reach Nurse Communication: Mobility status PT Visit Diagnosis: Other abnormalities of gait and mobility (R26.89);Muscle weakness (generalized) (M62.81)     Time: [id number] PT Time Calculation (min) (ACUTE ONLY): 29 min  Charges:  $Gait Training: 8-22 mins $Therapeutic Activity: 8-22 mins                     04/06/2020  Ginger Carne., PT Acute Rehabilitation Services [phone]  (pager) [phone]  (office)   [name] Adrien Shankar 04/06/2020, 5:29 PM

## 2020-04-06 NOTE — Progress Notes (Signed)
Patient ID: Brad [name], male   DOB: [birth year], 59 y.o.   MRN: [id number]     Advanced Heart Failure Rounding Note  PCP-Cardiologist: [name], MD   Subjective:    Sodium 129>122>123>119>125.    Received tolvaptan 4/14, 4/15.   Denies SOB.   Objective:   Weight Range: 87.2 kg Body mass index is 26.81 kg/m.   Vital Signs:   Temp:  [96.1 F (35.6 C)-97.9 F (36.6 C)] 97.9 F (36.6 C) (04/16 0714) Pulse Rate:  [56-58] 58 (04/16 0714) Resp:  [10-16] 16 (04/16 0940) BP: (128-132)/(68-88) 132/75 (04/16 0714) SpO2:  [95 %-100 %] 95 % (04/15 2211) Weight:  [87.2 kg] 87.2 kg (04/16 0500) Last BM Date: 04/03/20  Weight change: Filed Weights   04/04/20 0500 04/05/20 0500 04/06/20 0500  Weight: 85.6 kg 89.1 kg 87.2 kg    Intake/Output:  No intake or output data in the 24 hours ending 04/06/20 1444    Physical Exam    General: NAD Neck: JVP 14-16 cm, no thyromegaly or thyroid nodule.  Lungs: Clear to auscultation bilaterally with normal respiratory effort. CV: Nondisplaced PMI.  Heart regular S1/S2, no S3/S4, no murmur.  1+ ankle edema.   Abdomen: Soft, nontender, no hepatosplenomegaly, no distention.  Skin: Intact without lesions or rashes.  Neurologic: Alert and oriented x 3.  Psych: Normal affect. Extremities: No clubbing or cyanosis.  HEENT: Normal.    Telemetry   A fib 40s-50s   EKG   n/a  Labs    CBC Recent Labs    04/06/20 0208  WBC 3.7*  HGB 10.3*  HCT 30.8*  MCV 92.2  PLT [id number]   Basic Metabolic Panel Recent Labs    04/05/20 0748 04/05/20 0748 04/05/20 1605 04/06/20 0208  NA 119*   < > 121* 125*  K 4.6  --   --  4.8  CL 86*  --   --  92*  CO2 22  --   --  22  GLUCOSE 143*  --   --  148*  BUN 28*  --   --  38*  CREATININE 1.82*  --   --  1.94*  CALCIUM 8.3*  --   --  8.5*   < > = values in this interval not displayed.   Liver Function Tests Recent Labs    04/06/20 0208  AST 30  ALT 22  ALKPHOS 63  BILITOT 0.2*    PROT 6.4*  ALBUMIN 2.8*   No results for input(s): LIPASE, AMYLASE in the last 72 hours. Cardiac Enzymes No results for input(s): CKTOTAL, CKMB, CKMBINDEX, TROPONINI in the last 72 hours.  BNP: BNP (last 3 results) Recent Labs    01/01/20 0027 01/10/20 1622 01/16/20 1210  BNP 1,985.9* 1,118.5* 476.0*    ProBNP (last 3 results) No results for input(s): PROBNP in the last 8760 hours.   D-Dimer No results for input(s): DDIMER in the last 72 hours. Hemoglobin A1C No results for input(s): HGBA1C in the last 72 hours. Fasting Lipid Panel No results for input(s): CHOL, HDL, LDLCALC, TRIG, CHOLHDL, LDLDIRECT in the last 72 hours. Thyroid Function Tests No results for input(s): TSH, T4TOTAL, T3FREE, THYROIDAB in the last 72 hours.  Invalid input(s): FREET3  Other results:   Imaging    No results found.   Medications:     Scheduled Medications: . amitriptyline  20 mg Oral QHS  . atorvastatin  20 mg Oral Daily  . DULoxetine  60 mg Oral Daily  .  folic acid  1 mg Oral Daily  . furosemide  80 mg Intravenous BID  . gabapentin  600 mg Oral Daily  . heparin  5,000 Units Subcutaneous Q8H  . insulin aspart  1-3 Units Subcutaneous TID WC  . isosorbide-hydrALAZINE  1 tablet Oral BID  . mirtazapine  15 mg Oral QHS  . spironolactone  25 mg Oral Daily  . thiamine  100 mg Oral Daily    Infusions:   PRN Medications: acetaminophen, albuterol, docusate sodium, nitroGLYCERIN, polyethylene glycol, traMADol     Assessment/Plan   1. Fall, ?syncope: Due to ETOH intoxication + hyponatremia.  2. Hyponatremia: Sodium 111 on admit. He had tolvaptan 4/14, 4/15.  Na 125 today.  Suspect hypervolemic hyponatremia.  - Tolvaptan 15 mg daily today.  - Continue to diurese.  3. Acute on chronic systolic CHF: Echo (123456) EF 07/2019 EF 25-30%, moderately reduced RV fxn. Nonischemic cardiomyopathy, probably due to substance abuse (ETOH, prior cocaine).  He remains volume overloaded on  exam.  Weight coming down with diuresis but still above baseline.  Creatinine higher at 1.94.  - Continue Lasix at 80 mg IV bid and will give a dose of tolvaptan again as above.  - Stop Coreg with bradycardia.  - Continue Bidil 1 tab three times a day  - Continue spironolactone 25 mg daily.  4. Atrial fibrillation: Persistent.  He has been off anticoagulation due to ETOH abuse and frequent falls.  - HR 40s-50s, stop Coreg.  - Without anticoagulation, DCCV is not an option so stopped amiodarone.  - If he quits drinking and improves stability, start Eliquis.  5. ETOH abuse: Ideally needs rehab program.   Length of Stay: 6  Loralie Champagne, MD  04/06/2020, 2:44 PM  Advanced Heart Failure Team Pager [phone] (M-F; 7a - 4p)  Please contact [facility] for night-coverage after hours (4p -7a ) and weekends on [email or web address]

## 2020-04-07 DIAGNOSIS — E871 Hypo-osmolality and hyponatremia: Secondary | ICD-10-CM | POA: Diagnosis not present

## 2020-04-07 DIAGNOSIS — I5043 Acute on chronic combined systolic (congestive) and diastolic (congestive) heart failure: Secondary | ICD-10-CM | POA: Diagnosis not present

## 2020-04-07 LAB — GLUCOSE, CAPILLARY
Glucose-Capillary: 130 mg/dL — ABNORMAL HIGH (ref 70–99)
Glucose-Capillary: 154 mg/dL — ABNORMAL HIGH (ref 70–99)
Glucose-Capillary: 140 mg/dL — ABNORMAL HIGH (ref 70–99)
Glucose-Capillary: 141 mg/dL — ABNORMAL HIGH (ref 70–99)
Glucose-Capillary: 165 mg/dL — ABNORMAL HIGH (ref 70–99)

## 2020-04-07 LAB — BASIC METABOLIC PANEL
Anion gap: 8 (ref 5–15)
BUN: 39 mg/dL — ABNORMAL HIGH (ref 6–20)
CO2: 26 mmol/L (ref 22–32)
Calcium: 8.8 mg/dL — ABNORMAL LOW (ref 8.9–10.3)
Chloride: 97 mmol/L — ABNORMAL LOW (ref 98–111)
Creatinine, Ser: 1.91 mg/dL — ABNORMAL HIGH (ref 0.61–1.24)
GFR calc Af Amer: 43 mL/min — ABNORMAL LOW (ref >60)
GFR calc non Af Amer: 37 mL/min — ABNORMAL LOW (ref >60)
Glucose, Bld: 132 mg/dL — ABNORMAL HIGH (ref 70–99)
Potassium: 4.6 mmol/L (ref 3.5–5.1)
Sodium: 131 mmol/L — ABNORMAL LOW (ref 135–145)

## 2020-04-07 LAB — MAGNESIUM: Magnesium: 1.7 mg/dL (ref 1.7–2.4)

## 2020-04-07 MED ORDER — ISOSORB DINITRATE-HYDRALAZINE 20-37.5 MG PO TABS
2.0000 | ORAL_TABLET | Freq: Two times a day (BID) | ORAL | Status: DC
  Administered 2020-04-07 – 2020-04-08 (×2): 2 via ORAL
  Filled 2020-04-07 (×2): qty 2

## 2020-04-07 NOTE — Progress Notes (Signed)
PROGRESS NOTE  Brad [name] DOB: 1961/10/04 DOA: 03/30/2020 PCP: Charlott Rakes, MD   LOS: 7 days   Brief Narrative / Interim history: 59 year old male with alcohol abuse, prior CVA, DM, chronic systolic CHF, chronic kidney disease came to the hospital after falling at the store, was found to be hyponatremic and admitted to the ICU on 4/9.  He has a history of recurrent falls.  He also has a history of chronic hyponatremia with sodium ranging 125-130.  He was found to be in alcohol withdrawal and placed on Librium taper, transferred out of the ICU on 4/7.  Subjective / 24h Interval events: Adamant about going home, his son is visiting and will only be here for 2 more days  Assessment & Plan: Principal Problem Hyponatremia, with underlying CHF and beer potomania -Suspected beer potomania as well as CHF.  Heart failure team has been consulted and followed patient while hospitalized.  He initially received IV fluids however this was stopped and he was placed on diuresis given evidence of fluid overload.  He was also placed on tolvaptan.  His sodium fluctuated initially but eventually proving, 131 this morning.  Discussed with Dr. Aundra Dubin, continue IV diuresis and anticipate home within 24 hours if sodium and renal function remained stable  Active Problems Chronic systolic CHF/PAF -TTE from 07/2019 shows EF 25%, LV diffuse hypokinesis, mod-severe MR, moderate TR, RA and LA dilation, moderate RV dysfunction, and trivial pericardial effusion.  Updated 2D echo on 4/13 showed an EF of 35-40% with global hypokinesis of the left ventricle -Monitor daily weights, improving -Coreg as well as amiodarone discontinued  Chest pain -likely atypical ekg afib, trop mild  Flat.  Resolved  Acute metabolic encephalopathy  -presents with confusion in the setting of alcohol intoxication acute on chronic hyponatremia: finished librium  Headache/neck pain - from fall- ct head and ct  c spine  on admission No acute findings-add tramadol prn.says tylenol not working.  Alcohol abuse with signs of withdrawal  -EtOH level 120 on admission. Withdrawal symptoms improving  Liver cirrhosis  -due to chronic hep C and alcoholic liver disease -Contributing to hyponatremia as well  AKI on CKD stage IIIa  -Baseline creatinine1.2-1.3.  Creatinine gradually increasing, Lasix was discontinued today but did receive this morning's dose.  He is on spironolactone currently  DM2-A1c 6.2 in March, diet controlled  Recurrent falls-PT consulted, will need home health at the time of discharge  Scheduled Meds: . amitriptyline  20 mg Oral QHS  . atorvastatin  20 mg Oral Daily  . DULoxetine  60 mg Oral Daily  . folic acid  1 mg Oral Daily  . furosemide  80 mg Intravenous BID  . gabapentin  600 mg Oral Daily  . heparin  5,000 Units Subcutaneous Q8H  . insulin aspart  1-3 Units Subcutaneous TID WC  . isosorbide-hydrALAZINE  2 tablet Oral BID  . mirtazapine  15 mg Oral QHS  . spironolactone  25 mg Oral Daily  . thiamine  100 mg Oral Daily   Continuous Infusions: PRN Meds:.acetaminophen, albuterol, docusate sodium, nitroGLYCERIN, polyethylene glycol, traMADol  DVT prophylaxis: SCDs Code Status: Full code Family Communication: no family at bedside  Patient admitted from: home  Anticipated d/c place: home Barriers to d/c: Persistent hyponatremia  Consultants:  Cardiology  Procedures:  2D echo  Microbiology  None   Antimicrobials: None     Objective: Vitals:   04/06/20 1552 04/06/20 2300 04/07/20 0646 04/07/20 0841  BP: 138/80 (!) 152/78  (!) 148/86  Pulse: 63 (!) 58  (!) 52  Resp: 16 14  17   Temp: (!) 97.5 F (36.4 C) 97.6 F (36.4 C)  (!) 97.4 F (36.3 C)  TempSrc: Oral Oral    SpO2: 99% 99%  100%  Weight:   83.3 kg   Height:       No intake or output data in the 24 hours ending 04/07/20 1138 Filed Weights   04/05/20 0500 04/06/20 0500 04/07/20 0646  Weight: 89.1  kg 87.2 kg 83.3 kg    Examination:  Constitutional: NAD, in bed Eyes: No icterus ENMT: Moist mucous membranes Neck: normal, supple Respiratory: No wheezing, no crackles Cardiovascular: Regular rate and rhythm, 1+ edema Abdomen: Nondistended, bowel sounds positive Musculoskeletal: no clubbing / cyanosis.  Skin: No rashes Neurologic: No focal deficits   Data Reviewed: I have independently reviewed following labs and imaging studies   CBC: Recent Labs  Lab 04/06/20 0208  WBC 3.7*  HGB 10.3*  HCT 30.8*  MCV 92.2  PLT [id number]   Basic Metabolic Panel: Recent Labs  Lab 04/04/20 1624 04/04/20 1624 04/05/20 0005 04/05/20 0748 04/05/20 1605 04/06/20 0208 04/07/20 0402  NA 121*   < > 119* 119* 121* 125* 131*  K 4.6  --  4.6 4.6  --  4.8 4.6  CL 88*  --  86* 86*  --  92* 97*  CO2 21*  --  21* 22  --  22 26  GLUCOSE 159*  --  159* 143*  --  148* 132*  BUN 23*  --  27* 28*  --  38* 39*  CREATININE 1.52*  --  1.74* 1.82*  --  1.94* 1.91*  CALCIUM 8.4*  --  8.4* 8.3*  --  8.5* 8.8*  MG  --   --   --   --   --   --  1.7   < > = values in this interval not displayed.   Liver Function Tests: Recent Labs  Lab 04/06/20 0208  AST 30  ALT 22  ALKPHOS 63  BILITOT 0.2*  PROT 6.4*  ALBUMIN 2.8*   Coagulation Profile: No results for input(s): INR, PROTIME in the last 168 hours. HbA1C: No results for input(s): HGBA1C in the last 72 hours. CBG: Recent Labs  Lab 04/05/20 2049 04/06/20 0716 04/06/20 1211 04/06/20 1707 04/07/20 0840  GLUCAP 160* 148* 149* 139* 130*    Recent Results (from the past 240 hour(s))  Respiratory Panel by RT PCR (Flu A&B, Covid) - Nasopharyngeal Swab     Status: None   Collection Time: 03/30/20 10:44 PM   Specimen: Nasopharyngeal Swab  Result Value Ref Range Status   SARS Coronavirus 2 by RT PCR NEGATIVE NEGATIVE Final    Comment: (NOTE) SARS-CoV-2 target nucleic acids are NOT DETECTED. The SARS-CoV-2 RNA is generally detectable in upper  respiratoy specimens during the acute phase of infection. The lowest concentration of SARS-CoV-2 viral copies this assay can detect is 131 copies/mL. A negative result does not preclude SARS-Cov-2 infection and should not be used as the sole basis for treatment or other patient management decisions. A negative result may occur with  improper specimen collection/handling, submission of specimen other than nasopharyngeal swab, presence of viral mutation(s) within the areas targeted by this assay, and inadequate number of viral copies (<131 copies/mL). A negative result must be combined with clinical observations, patient history, and epidemiological information. The expected result is Negative. Fact Sheet for Patients:  PinkCheek.be Fact Sheet for Healthcare Providers:  GravelBags.it This test is not yet ap proved or cleared by the [place] and  has been authorized for detection and/or diagnosis of SARS-CoV-2 by FDA under an Emergency Use Authorization (EUA). This EUA will remain  in effect (meaning this test can be used) for the duration of the COVID-19 declaration under Section 564(b)(1) of the Act, 21 U.S.C. section 360bbb-3(b)(1), unless the authorization is terminated or revoked sooner.    Influenza A by PCR NEGATIVE NEGATIVE Final   Influenza B by PCR NEGATIVE NEGATIVE Final    Comment: (NOTE) The Xpert Xpress SARS-CoV-2/FLU/RSV assay is intended as an aid in  the diagnosis of influenza from Nasopharyngeal swab specimens and  should not be used as a sole basis for treatment. Nasal washings and  aspirates are unacceptable for Xpert Xpress SARS-CoV-2/FLU/RSV  testing. Fact Sheet for Patients: PinkCheek.be Fact Sheet for Healthcare Providers: GravelBags.it This test is not yet approved or cleared by the [place] FDA and  has been authorized for  detection and/or diagnosis of SARS-CoV-2 by  FDA under an Emergency Use Authorization (EUA). This EUA will remain  in effect (meaning this test can be used) for the duration of the  Covid-19 declaration under Section 564(b)(1) of the Act, 21  U.S.C. section 360bbb-3(b)(1), unless the authorization is  terminated or revoked. Performed at [facility], [place] [address] [place], [place] [address]   MRSA PCR Screening     Status: None   Collection Time: 03/31/20  3:00 AM   Specimen: Nasal Mucosa; Nasopharyngeal  Result Value Ref Range Status   MRSA by PCR NEGATIVE NEGATIVE Final    Comment:        The GeneXpert MRSA Assay (FDA approved for NASAL specimens only), is one component of a comprehensive MRSA colonization surveillance program. It is not intended to diagnose MRSA infection nor to guide or monitor treatment for MRSA infections. Performed at [facility], [place] [address] [place], [place] [address]      Radiology Studies: No results found. Marzetta Board, MD, PhD [facility]  Between 7 am - 7 pm I am available, please contact me via Amion or Securechat  Between 7 pm - 7 am I am not available, please contact night coverage MD/APP via Amion

## 2020-04-07 NOTE — Progress Notes (Signed)
Patient ID: Brad [name], male   DOB: Jan 26, 1961, 59 y.o.   MRN: [id number]     Advanced Heart Failure Rounding Note  PCP-Cardiologist: [name], MD   Subjective:    Sodium 129>122>123>119>125>131.    Received tolvaptan 4/14, 4/15, 4/16.   He is on IV Lasix. We are unable to obtain accurate I/Os on this floor but his weight appears to be trending down.   Denies SOB and wants to go home. SBP 140s.   Objective:   Weight Range: 83.3 kg Body mass index is 25.61 kg/m.   Vital Signs:   Temp:  [97.4 F (36.3 C)-97.6 F (36.4 C)] 97.4 F (36.3 C) (04/17 0841) Pulse Rate:  [52-63] 52 (04/17 0841) Resp:  [14-17] 17 (04/17 0841) BP: (138-152)/(78-86) 148/86 (04/17 0841) SpO2:  [99 %-100 %] 100 % (04/17 0841) Weight:  [83.3 kg] 83.3 kg (04/17 0646) Last BM Date: 04/03/20  Weight change: Filed Weights   04/05/20 0500 04/06/20 0500 04/07/20 0646  Weight: 89.1 kg 87.2 kg 83.3 kg    Intake/Output:  No intake or output data in the 24 hours ending 04/07/20 1105    Physical Exam    General: NAD Neck: JVP 12-14 cm, no thyromegaly or thyroid nodule.  Lungs: Clear to auscultation bilaterally with normal respiratory effort. CV: Nondisplaced PMI.  Heart regular S1/S2, no S3/S4, no murmur.  Trace ankle edema.   Abdomen: Soft, nontender, no hepatosplenomegaly, no distention.  Skin: Intact without lesions or rashes.  Neurologic: Alert and oriented x 3.  Psych: Normal affect. Extremities: No clubbing or cyanosis.  HEENT: Normal.    Telemetry   A fib 50s-60s (personally reviewed)   EKG   n/a  Labs    CBC Recent Labs    04/06/20 0208  WBC 3.7*  HGB 10.3*  HCT 30.8*  MCV 92.2  PLT [id number]   Basic Metabolic Panel Recent Labs    04/06/20 0208 04/07/20 0402  NA 125* 131*  K 4.8 4.6  CL 92* 97*  CO2 22 26  GLUCOSE 148* 132*  BUN 38* 39*  CREATININE 1.94* 1.91*  CALCIUM 8.5* 8.8*  MG  --  1.7   Liver Function Tests Recent Labs    04/06/20 0208  AST 30   ALT 22  ALKPHOS 63  BILITOT 0.2*  PROT 6.4*  ALBUMIN 2.8*   No results for input(s): LIPASE, AMYLASE in the last 72 hours. Cardiac Enzymes No results for input(s): CKTOTAL, CKMB, CKMBINDEX, TROPONINI in the last 72 hours.  BNP: BNP (last 3 results) Recent Labs    01/01/20 0027 01/10/20 1622 01/16/20 1210  BNP 1,985.9* 1,118.5* 476.0*    ProBNP (last 3 results) No results for input(s): PROBNP in the last 8760 hours.   D-Dimer No results for input(s): DDIMER in the last 72 hours. Hemoglobin A1C No results for input(s): HGBA1C in the last 72 hours. Fasting Lipid Panel No results for input(s): CHOL, HDL, LDLCALC, TRIG, CHOLHDL, LDLDIRECT in the last 72 hours. Thyroid Function Tests No results for input(s): TSH, T4TOTAL, T3FREE, THYROIDAB in the last 72 hours.  Invalid input(s): FREET3  Other results:   Imaging    No results found.   Medications:     Scheduled Medications: . amitriptyline  20 mg Oral QHS  . atorvastatin  20 mg Oral Daily  . DULoxetine  60 mg Oral Daily  . folic acid  1 mg Oral Daily  . furosemide  80 mg Intravenous BID  . gabapentin  600 mg Oral  Daily  . heparin  5,000 Units Subcutaneous Q8H  . insulin aspart  1-3 Units Subcutaneous TID WC  . isosorbide-hydrALAZINE  2 tablet Oral BID  . mirtazapine  15 mg Oral QHS  . spironolactone  25 mg Oral Daily  . thiamine  100 mg Oral Daily    Infusions:   PRN Medications: acetaminophen, albuterol, docusate sodium, nitroGLYCERIN, polyethylene glycol, traMADol     Assessment/Plan   1. Fall, ?syncope: Due to ETOH intoxication + hyponatremia.  2. Hyponatremia: Sodium 111 on admit. Volume overloaded + large po intake (beer at home, water after admitted in hospital).  He had tolvaptan 4/14, 4/15, 4/16.  Na 131 today.  Suspect hypervolemic hyponatremia.  - Continue to fluid restrict (doing better with this) but will not give tolvaptan today.  3. Acute on chronic systolic CHF: Echo (123456) EF  07/2019 EF 25-30%, moderately reduced RV fxn. Nonischemic cardiomyopathy, probably due to substance abuse (ETOH, prior cocaine).  He remains volume overloaded on exam.  Weight coming down with diuresis but still above baseline. Impossible to get I/Os apparently.  Creatinine stable at 1.91.  - Continue Lasix at 80 mg IV bid today.   - Stopped Coreg with bradycardia (HR in 40s).  - Increase Bidil to 2 tabs tid.   - Continue spironolactone 25 mg daily.  4. Atrial fibrillation: Persistent.  He has been off anticoagulation due to heavy ETOH abuse and frequent falls.  - Off Coreg with low HR.  - Without anticoagulation, DCCV is not an option so stopped amiodarone.  - If he quits drinking and improves stability, start Eliquis.  5. ETOH abuse: Ideally needs rehab program.   Length of Stay: 7  Loralie Champagne, MD  04/07/2020, 11:05 AM  Advanced Heart Failure Team Pager [phone] (M-F; 7a - 4p)  Please contact [facility] for night-coverage after hours (4p -7a ) and weekends on [email or web address]

## 2020-04-08 DIAGNOSIS — I5023 Acute on chronic systolic (congestive) heart failure: Secondary | ICD-10-CM

## 2020-04-08 DIAGNOSIS — I10 Essential (primary) hypertension: Secondary | ICD-10-CM

## 2020-04-08 DIAGNOSIS — E871 Hypo-osmolality and hyponatremia: Secondary | ICD-10-CM | POA: Diagnosis not present

## 2020-04-08 LAB — COMPREHENSIVE METABOLIC PANEL
ALT: 22 U/L (ref 0–44)
AST: 25 U/L (ref 15–41)
Albumin: 2.9 g/dL — ABNORMAL LOW (ref 3.5–5.0)
Alkaline Phosphatase: 59 U/L (ref 38–126)
Anion gap: 10 (ref 5–15)
BUN: 36 mg/dL — ABNORMAL HIGH (ref 6–20)
CO2: 25 mmol/L (ref 22–32)
Calcium: 8.9 mg/dL (ref 8.9–10.3)
Chloride: 95 mmol/L — ABNORMAL LOW (ref 98–111)
Creatinine, Ser: 1.92 mg/dL — ABNORMAL HIGH (ref 0.61–1.24)
GFR calc Af Amer: 43 mL/min — ABNORMAL LOW (ref >60)
GFR calc non Af Amer: 37 mL/min — ABNORMAL LOW (ref >60)
Glucose, Bld: 145 mg/dL — ABNORMAL HIGH (ref 70–99)
Potassium: 4.6 mmol/L (ref 3.5–5.1)
Sodium: 130 mmol/L — ABNORMAL LOW (ref 135–145)
Total Bilirubin: 0.3 mg/dL (ref 0.3–1.2)
Total Protein: 6.7 g/dL (ref 6.5–8.1)

## 2020-04-08 LAB — CBC
HCT: 32.3 % — ABNORMAL LOW (ref 39.0–52.0)
Hemoglobin: 10.7 g/dL — ABNORMAL LOW (ref 13.0–17.0)
MCH: 30.7 pg (ref 26.0–34.0)
MCHC: 33.1 g/dL (ref 30.0–36.0)
MCV: 92.8 fL (ref 80.0–100.0)
Platelets: 195 10*3/uL (ref 150–400)
RBC: 3.48 MIL/uL — ABNORMAL LOW (ref 4.22–5.81)
RDW: 14.2 % (ref 11.5–15.5)
WBC: 4 10*3/uL (ref 4.0–10.5)
nRBC: 0 % (ref 0.0–0.2)

## 2020-04-08 LAB — GLUCOSE, CAPILLARY
Glucose-Capillary: 129 mg/dL — ABNORMAL HIGH (ref 70–99)
Glucose-Capillary: 106 mg/dL — ABNORMAL HIGH (ref 70–99)

## 2020-04-08 MED ORDER — ISOSORB DINITRATE-HYDRALAZINE 20-37.5 MG PO TABS: 2.0000 | ORAL_TABLET | Freq: Three times a day (TID) | ORAL | 1 refills | Status: AC

## 2020-04-08 MED ORDER — SPIRONOLACTONE 25 MG PO TABS: 25.0000 mg | ORAL_TABLET | Freq: Every day | ORAL | 1 refills | Status: DC

## 2020-04-08 MED ORDER — ATORVASTATIN CALCIUM 20 MG PO TABS: 20.0000 mg | ORAL_TABLET | Freq: Every day | ORAL | 1 refills | Status: DC

## 2020-04-08 MED ORDER — TORSEMIDE 20 MG PO TABS: 40.0000 mg | ORAL_TABLET | Freq: Two times a day (BID) | ORAL | 1 refills | Status: DC

## 2020-04-08 MED ORDER — TRAMADOL HCL 50 MG PO TABS: 50.0000 mg | ORAL_TABLET | Freq: Two times a day (BID) | ORAL | 0 refills | Status: DC | PRN

## 2020-04-08 NOTE — Progress Notes (Signed)
Patient was stable at discharge. I removed his IV. We reviewed the discharge education. Patient/Family verbalized understanding and had no further questions. Patient left with prescriptions in hand.

## 2020-04-08 NOTE — Progress Notes (Signed)
When pt transferred from wheelchair to roommate's care upon discharge. Bed bugs were found by NT on pt's clothing. Please assess during follow up appointments and possible future hospital visits.

## 2020-04-08 NOTE — Discharge Summary (Signed)
Physician Discharge Summary  Brad [name] [id number] DOB: 1961-07-03 DOA: 03/30/2020  PCP: Brad Rakes, MD  Admit date: 03/30/2020 Discharge date: 04/08/2020  Admitted From: home Disposition:  home  Recommendations for Outpatient Follow-up:  1. Follow up with heart failure clinic as scheduled  Home Health: PT, RN Equipment/Devices: none  Discharge Condition: stable CODE STATUS: Full code Diet recommendation: low sodium  HPI: Per admitting MD, Brad [name] is a 59 y.o. M with PMH of ETOH abuse, Afib, CHF, HTN with multiple admissions this year who had a fall with syncope and so was brought to the ED.   He has a history of chronic hyponatremia, but found to be profoundly so with Na of 111.   Urine Na  38 after NS started, and urine osms low, serum osms 270. CT head and C-spine without acute findings.   Throughout ED course he has been alert and oriented.  Received approximately 500cc's normal saline and per nursing has urinated ~3L. Patient reports that his Torsemide was decreased after last admission where his hyponatremia was thought to be due to volume depletion.  Since then he has noted worsening LE edema.  He denies any chest pain or pressure or preceding numbness, weakness or vision changes prior to his fall.  This is his third fall this week and after last admission his Eliquis was held. .  Pt continues to drink, last was yesterday just prior to admission.  Over the last few days he endorses very little po intake.  Repeat metabolic panel is pending.   Hospital Course / Discharge diagnoses: Principal Problem Hyponatremia, hypervolemic, with underlying CHF and beer potomania -Suspected beer potomania as well as CHF.  Heart failure team has been consulted and followed patient while hospitalized.  He was placed on IV diuresis given evidence of fluid overload and was given tolvaptan.  His fluid status improved, he is net -13 L weight is improved from around 1 90-1 75 on discharge.   Cardiology recommends medication changes with torsemide 40 mg twice daily, BiDil 2 tablets 3 times daily, spironolactone 25 and atorvastatin 25 on discharge.  Per cardiology, due to bradycardia his digoxin, amiodarone have been discontinued.  Active Problems Acute on chronic systolic CHF/PAF -TTE from 07/2019 shows EF 25%, LV diffuse hypokinesis, mod-severe MR, moderate TR, RA and LA dilation, moderate RV dysfunction, and trivial pericardial effusion.  Updated 2D echo on 4/13 showed an EF of 35-40% with global hypokinesis of the left ventricle.  He was managed with diuresis as described above and will have follow-up in heart failure clinic. Chest pain -likely atypical ekg afib, trop mild Flat.  Resolved Acute metabolic encephalopathy -presents with confusion in the setting of alcohol intoxication acute on chronic hyponatremia: finished a Librium taper in the hospital, his mental status has returned to baseline Headache/neck pain - from fall- ct head and ct c spine on admission No acute findings-short course of tramadol Alcohol abuse with signs of withdrawal  -EtOH level 120 on admission.  No longer withdrawing, determined to quit Liver cirrhosis  -due to chronic hep C and alcoholic liver disease, Contributing to hyponatremia as well AKI on CKD stage IIIa  -Baseline creatinine1.2-1.3.  His creatinine improved to around 1.9 with diuresis and has remained stable.  Will be followed up in heart failure clinic as an outpatient, I wonder whether this is a new baseline for him DM2-A1c 6.2 in March, diet controlled  Recurrent falls-home health   Discharge Instructions   Allergies as of 04/08/2020  Reactions   Angiotensin Receptor Blockers Anaphylaxis, Other (See Comments)   (Angioedema also with Lisinopril, therefore ARB's are contraindicated)   Lisinopril Anaphylaxis, Swelling   Throat swelling   Pamelor [nortriptyline Hcl] Anaphylaxis, Swelling   Throat swells      Medication List      STOP taking these medications   amiodarone 200 MG tablet Commonly known as: PACERONE   digoxin 0.125 MG tablet Commonly known as: LANOXIN   potassium chloride SA 20 MEQ tablet Commonly known as: KLOR-CON     TAKE these medications   Accu-Chek Aviva Plus test strip Generic drug: glucose blood USE AS DIRECTED THREE TIMES DAILY   albuterol 108 (90 Base) MCG/ACT inhaler Commonly known as: VENTOLIN HFA Inhale 2 puffs into the lungs every 6 (six) hours as needed for shortness of breath.   amitriptyline 10 MG tablet Commonly known as: ELAVIL Take 20 mg by mouth at bedtime.   atorvastatin 20 MG tablet Commonly known as: LIPITOR Take 1 tablet (20 mg total) by mouth daily at 6 PM. What changed: when to take this   cyanocobalamin 1000 MCG tablet Take 1 tablet (1,000 mcg total) by mouth daily.   DULoxetine 60 MG capsule Commonly known as: CYMBALTA Take 1 capsule (60 mg total) by mouth daily.   folic acid 1 MG tablet Commonly known as: FOLVITE Take 1 tablet (1 mg total) by mouth daily.   gabapentin 300 MG capsule Commonly known as: NEURONTIN TAKE 2 CAPSULES (600 MG TOTAL) BY MOUTH 2 (TWO) TIMES DAILY.   isosorbide-hydrALAZINE 20-37.5 MG tablet Commonly known as: BIDIL Take 2 tablets by mouth 3 (three) times daily. What changed: how much to take   mirtazapine 15 MG tablet Commonly known as: REMERON Take 15 mg by mouth at bedtime.   spironolactone 25 MG tablet Commonly known as: ALDACTONE Take 1 tablet (25 mg total) by mouth daily.   torsemide 20 MG tablet Commonly known as: DEMADEX Take 2 tablets (40 mg total) by mouth 2 (two) times daily. What changed:   See the new instructions.  Another medication with the same name was removed. Continue taking this medication, and follow the directions you see here.   traMADol 50 MG tablet Commonly known as: ULTRAM Take 1 tablet (50 mg total) by mouth every 12 (twelve) hours as needed for moderate pain.   zolpidem 5 MG  tablet Commonly known as: AMBIEN Take 5 mg by mouth at bedtime.      Follow-up Information    [facility] Follow up on 04/16/2020.   Specialty: Cardiology Why: at 2:30  Contact information: [address] [id number] [place] [phone]          Consultations:  Cardiology   Procedures/Studies:  DG Chest 1 View  Result Date: 04/02/2020 CLINICAL DATA:  Chest pain, history of congestive heart failure and diabetes EXAM: CHEST  1 VIEW COMPARISON:  03/23/2020 FINDINGS: Single frontal view of the chest demonstrates an enlarged cardiac silhouette. There is increased vascular congestion and interval development of diffuse interstitial prominence. No dense consolidation. No effusion or pneumothorax. No acute bony abnormalities. IMPRESSION: 1. Findings consistent with early congestive heart failure. Electronically Signed   By: Randa Ngo M.D.   On: 04/02/2020 16:04   DG Pelvis 1-2 Views  Result Date: 03/23/2020 CLINICAL DATA:  [place], found in yard, ethanol present EXAM: PELVIS - 1-2 VIEW COMPARISON:  Portable exam 1348 hours compared to 08/16/2018 FINDINGS: Osseous demineralization. Hip and  SI joint spaces preserved. Minimal regularity at the lateral margins of the femoral head/neck junctions bilaterally likely degenerative. No acute fracture, dislocation or bone destruction. IMPRESSION: No acute abnormalities. Electronically Signed   By: Lavonia Dana M.D.   On: 03/23/2020 14:09   CT Head Wo Contrast  Result Date: 03/30/2020 CLINICAL DATA:  Syncope, fall, posterior headache, neck pain. EXAM: CT HEAD WITHOUT CONTRAST CT CERVICAL SPINE WITHOUT CONTRAST TECHNIQUE: Multidetector CT imaging of the head and cervical spine was performed following the standard protocol without intravenous contrast. Multiplanar CT image reconstructions of the cervical spine were also generated. COMPARISON:  03/16/2020 FINDINGS: CT HEAD  FINDINGS Brain: No evidence of acute infarction, hemorrhage, hydrocephalus, extra-axial collection or mass lesion/mass effect. Subcortical white matter and periventricular small vessel ischemic changes. Vascular: Intracranial atherosclerosis. Skull: Normal. Negative for fracture or focal lesion. Sinuses/Orbits: The visualized paranasal sinuses are essentially clear. The mastoid air cells are unopacified. Other: None. CT CERVICAL SPINE FINDINGS Alignment: Reversal of the normal mid cervical lordosis. Skull base and vertebrae: No acute fracture. No primary bone lesion or focal pathologic process. Soft tissues and spinal canal: No prevertebral fluid or swelling. No visible canal hematoma. Disc levels: Mild degenerative changes of the mid cervical spine. Spinal canal is patent. Upper chest: Visualized lung apices are clear. Other: Visualized thyroid is unremarkable. IMPRESSION: No evidence of acute intracranial abnormality. Small vessel ischemic changes. No evidence of traumatic injury to the cervical spine. Mild degenerative changes of the mid cervical spine. Electronically Signed   By: Julian Hy M.D.   On: 03/30/2020 20:42   CT Head Wo Contrast  Result Date: 03/23/2020 CLINICAL DATA:  Ground level fall EXAM: CT HEAD WITHOUT CONTRAST CT CERVICAL SPINE WITHOUT CONTRAST TECHNIQUE: Multidetector CT imaging of the head and cervical spine was performed following the standard protocol without intravenous contrast. Multiplanar CT image reconstructions of the cervical spine were also generated. COMPARISON:  CT brain and cervical spine 03/16/2020 FINDINGS: CT HEAD FINDINGS Brain: No acute territorial infarction, hemorrhage, or intracranial mass. Mild atrophy. Patchy hypodensity in the white matter consistent with chronic small vessel ischemic change. The ventricles are nonenlarged Vascular: No hyperdense vessels.  Carotid vascular calcification. Skull: Normal. Negative for fracture or focal lesion. Sinuses/Orbits:  Mild mucosal thickening in the maxillary and ethmoid sinuses Other: None CT CERVICAL SPINE FINDINGS Alignment: Mild motion degradation. Sagittal alignment is stable with mild reversal of cervical lordosis. Facet alignment is maintained. There is rotated appearance of C1 on C2 which is probably positional. Skull base and vertebrae: No acute fracture. No primary bone lesion or focal pathologic process. Soft tissues and spinal canal: No prevertebral fluid or swelling. No visible canal hematoma. Disc levels: Mild to moderate degenerative changes at C4-C5 and C5-C6. Posterior disc osteophyte complex at C4-C5 and C5-C6 results in at least mild canal stenosis at C4-C5 and moderate canal stenosis at C5-C6. Facet degenerative changes at multiple levels with bilateral foraminal stenosis, mild at C4-C5 and moderate at C5-C6. Upper chest: Negative. Other: Carotid vascular calcification. IMPRESSION: 1. No CT evidence for acute intracranial abnormality. Atrophy and chronic small vessel ischemic changes of the white matter. 2. Degenerative changes of the cervical spine. No definitive fracture is seen. Rotated appearance of C1 on C2 is similar as compared with 03/16/2020 and suspected to be secondary to positioning. Electronically Signed   By: Donavan Foil M.D.   On: 03/23/2020 15:40   CT Head Wo Contrast  Result Date: 03/16/2020 CLINICAL DATA:  Recent fall with headaches and neck pain,  initial encounter EXAM: CT HEAD WITHOUT CONTRAST CT CERVICAL SPINE WITHOUT CONTRAST TECHNIQUE: Multidetector CT imaging of the head and cervical spine was performed following the standard protocol without intravenous contrast. Multiplanar CT image reconstructions of the cervical spine were also generated. COMPARISON:  03/05/2020 FINDINGS: CT HEAD FINDINGS Brain: Mild chronic white matter ischemic change is noted. No findings to suggest acute hemorrhage, acute infarction or space-occupying mass lesion are noted. Vascular: No hyperdense vessel or  unexpected calcification. Skull: Normal. Negative for fracture or focal lesion. Sinuses/Orbits: No acute finding. Other: Mild left periorbital soft tissue swelling is noted consistent with the recent injury. CT CERVICAL SPINE FINDINGS Alignment: Alignment is stable when compared with the prior exam. Skull base and vertebrae: 7 cervical segments are well visualized. Vertebral body height is well maintained. Mild osteophytic changes and facet hypertrophic changes are noted. No acute fracture or acute facet abnormality is seen. Soft tissues and spinal canal: Surrounding soft tissue structures are unremarkable. Upper chest: Visualized lung apices are within normal limits. Other: None IMPRESSION: CT of the head: Chronic white matter ischemic changes without acute abnormality. CT of the cervical spine: Chronic degenerative changes without acute abnormality. Electronically Signed   By: Inez Catalina M.D.   On: 03/16/2020 16:05   CT Cervical Spine Wo Contrast  Result Date: 03/30/2020 CLINICAL DATA:  Syncope, fall, posterior headache, neck pain. EXAM: CT HEAD WITHOUT CONTRAST CT CERVICAL SPINE WITHOUT CONTRAST TECHNIQUE: Multidetector CT imaging of the head and cervical spine was performed following the standard protocol without intravenous contrast. Multiplanar CT image reconstructions of the cervical spine were also generated. COMPARISON:  03/16/2020 FINDINGS: CT HEAD FINDINGS Brain: No evidence of acute infarction, hemorrhage, hydrocephalus, extra-axial collection or mass lesion/mass effect. Subcortical white matter and periventricular small vessel ischemic changes. Vascular: Intracranial atherosclerosis. Skull: Normal. Negative for fracture or focal lesion. Sinuses/Orbits: The visualized paranasal sinuses are essentially clear. The mastoid air cells are unopacified. Other: None. CT CERVICAL SPINE FINDINGS Alignment: Reversal of the normal mid cervical lordosis. Skull base and vertebrae: No acute fracture. No primary  bone lesion or focal pathologic process. Soft tissues and spinal canal: No prevertebral fluid or swelling. No visible canal hematoma. Disc levels: Mild degenerative changes of the mid cervical spine. Spinal canal is patent. Upper chest: Visualized lung apices are clear. Other: Visualized thyroid is unremarkable. IMPRESSION: No evidence of acute intracranial abnormality. Small vessel ischemic changes. No evidence of traumatic injury to the cervical spine. Mild degenerative changes of the mid cervical spine. Electronically Signed   By: Julian Hy M.D.   On: 03/30/2020 20:42   CT Cervical Spine Wo Contrast  Result Date: 03/23/2020 CLINICAL DATA:  Ground level fall EXAM: CT HEAD WITHOUT CONTRAST CT CERVICAL SPINE WITHOUT CONTRAST TECHNIQUE: Multidetector CT imaging of the head and cervical spine was performed following the standard protocol without intravenous contrast. Multiplanar CT image reconstructions of the cervical spine were also generated. COMPARISON:  CT brain and cervical spine 03/16/2020 FINDINGS: CT HEAD FINDINGS Brain: No acute territorial infarction, hemorrhage, or intracranial mass. Mild atrophy. Patchy hypodensity in the white matter consistent with chronic small vessel ischemic change. The ventricles are nonenlarged Vascular: No hyperdense vessels.  Carotid vascular calcification. Skull: Normal. Negative for fracture or focal lesion. Sinuses/Orbits: Mild mucosal thickening in the maxillary and ethmoid sinuses Other: None CT CERVICAL SPINE FINDINGS Alignment: Mild motion degradation. Sagittal alignment is stable with mild reversal of cervical lordosis. Facet alignment is maintained. There is rotated appearance of C1 on C2 which is probably positional.  Skull base and vertebrae: No acute fracture. No primary bone lesion or focal pathologic process. Soft tissues and spinal canal: No prevertebral fluid or swelling. No visible canal hematoma. Disc levels: Mild to moderate degenerative changes at  C4-C5 and C5-C6. Posterior disc osteophyte complex at C4-C5 and C5-C6 results in at least mild canal stenosis at C4-C5 and moderate canal stenosis at C5-C6. Facet degenerative changes at multiple levels with bilateral foraminal stenosis, mild at C4-C5 and moderate at C5-C6. Upper chest: Negative. Other: Carotid vascular calcification. IMPRESSION: 1. No CT evidence for acute intracranial abnormality. Atrophy and chronic small vessel ischemic changes of the white matter. 2. Degenerative changes of the cervical spine. No definitive fracture is seen. Rotated appearance of C1 on C2 is similar as compared with 03/16/2020 and suspected to be secondary to positioning. Electronically Signed   By: Donavan Foil M.D.   On: 03/23/2020 15:40   CT Cervical Spine Wo Contrast  Result Date: 03/16/2020 CLINICAL DATA:  Recent fall with headaches and neck pain, initial encounter EXAM: CT HEAD WITHOUT CONTRAST CT CERVICAL SPINE WITHOUT CONTRAST TECHNIQUE: Multidetector CT imaging of the head and cervical spine was performed following the standard protocol without intravenous contrast. Multiplanar CT image reconstructions of the cervical spine were also generated. COMPARISON:  03/05/2020 FINDINGS: CT HEAD FINDINGS Brain: Mild chronic white matter ischemic change is noted. No findings to suggest acute hemorrhage, acute infarction or space-occupying mass lesion are noted. Vascular: No hyperdense vessel or unexpected calcification. Skull: Normal. Negative for fracture or focal lesion. Sinuses/Orbits: No acute finding. Other: Mild left periorbital soft tissue swelling is noted consistent with the recent injury. CT CERVICAL SPINE FINDINGS Alignment: Alignment is stable when compared with the prior exam. Skull base and vertebrae: 7 cervical segments are well visualized. Vertebral body height is well maintained. Mild osteophytic changes and facet hypertrophic changes are noted. No acute fracture or acute facet abnormality is seen. Soft  tissues and spinal canal: Surrounding soft tissue structures are unremarkable. Upper chest: Visualized lung apices are within normal limits. Other: None IMPRESSION: CT of the head: Chronic white matter ischemic changes without acute abnormality. CT of the cervical spine: Chronic degenerative changes without acute abnormality. Electronically Signed   By: Inez Catalina M.D.   On: 03/16/2020 16:05   DG Chest Port 1 View  Result Date: 03/23/2020 CLINICAL DATA:  Pain following fall EXAM: PORTABLE CHEST 1 VIEW COMPARISON:  March 16, 2020 FINDINGS: Lungs are clear. There is cardiomegaly with pulmonary vascularity normal. No adenopathy. There is aortic atherosclerosis. No pneumothorax. No bone lesions. IMPRESSION: Stable cardiomegaly. Lungs clear. No pneumothorax. Aortic Atherosclerosis (ICD10-I70.0). Electronically Signed   By: Lowella Grip III M.D.   On: 03/23/2020 14:04   DG Chest Port 1 View  Result Date: 03/16/2020 CLINICAL DATA:  Fall, diabetes mellitus, prostate cancer, CHF, cirrhosis, hypertension, non ischemic cardiomyopathy, atrial flutter EXAM: PORTABLE CHEST 1 VIEW COMPARISON:  Portable exam 1539 hours compared to 03/05/2020 FINDINGS: Enlargement of cardiac silhouette. Slight pulmonary vascular congestion. Atherosclerotic calcification aorta. Minimal subsegmental atelectasis at bases. Lungs otherwise clear. No infiltrate, pleural effusion or pneumothorax. Bones demineralized. IMPRESSION: Enlargement of cardiac silhouette with slight pulmonary vascular congestion. Minimal bibasilar atelectasis without infiltrate. Electronically Signed   By: Lavonia Dana M.D.   On: 03/16/2020 15:46   ECHOCARDIOGRAM COMPLETE  Result Date: 04/03/2020    ECHOCARDIOGRAM REPORT   Patient Name:   SINCEAR TANTON Date of Exam: 04/03/2020 Medical Rec #:  [id number]       Height:  71.0 in Accession #:    [id number]      Weight:       182.8 lb Date of Birth:  10-16-1961      BSA:          2.029 m Patient Age:    13 years         BP:           116/76 mmHg Patient Gender: M               HR:           60 bpm. Exam Location:  Inpatient Procedure: 2D Echo, Cardiac Doppler and Color Doppler Indications:    Chest pain 486.50/R07.9  History:        Patient has prior history of Echocardiogram examinations, most                 recent 08/22/2019. CAD, Arrythmias:Atrial Fibrillation; Risk                 Factors:Diabetes, Current Smoker and Hypertension. Polysubstance                 abuse. CKD.  Sonographer:    Clayton Lefort RDCS (AE) Referring Phys: [id number] [place] IMPRESSIONS  1. Left ventricular ejection fraction, by estimation, is 35 to 40%. The left ventricle has moderately decreased function. The left ventricle demonstrates global hypokinesis. The left ventricular internal cavity size was mildly dilated. There is mild eccentric left ventricular hypertrophy. Left ventricular diastolic function could not be evaluated.  2. Right ventricular systolic function is moderately reduced. The right ventricular size is normal. Tricuspid regurgitation signal is inadequate for assessing PA pressure.  3. Left atrial size was severely dilated.  4. Right atrial size was severely dilated.  5. The mitral valve is normal in structure. Moderate to severe mitral valve regurgitation.  6. Tricuspid valve regurgitation is moderate.  7. The aortic valve is normal in structure. Aortic valve regurgitation is mild.  8. The inferior vena cava is dilated in size with <50% respiratory variability, suggesting right atrial pressure of 15 mmHg. Comparison(s): Prior images reviewed side by side. The left ventricular function has improved. The rhythm is now atrial fibrillation. The mitral insufficiency severity appears unchanged. FINDINGS  Left Ventricle: Left ventricular ejection fraction, by estimation, is 35 to 40%. The left ventricle has moderately decreased function. The left ventricle demonstrates global hypokinesis. The left ventricular internal cavity size was mildly  dilated. There is mild eccentric left ventricular hypertrophy. Left ventricular diastolic function could not be evaluated due to atrial fibrillation. Left ventricular diastolic function could not be evaluated. Right Ventricle: The right ventricular size is normal. No increase in right ventricular wall thickness. Right ventricular systolic function is moderately reduced. Tricuspid regurgitation signal is inadequate for assessing PA pressure. The tricuspid regurgitant velocity is 3.13 m/s, and with an assumed right atrial pressure of 15 mmHg, the estimated right ventricular systolic pressure is [id number] mmHg. Left Atrium: Left atrial size was severely dilated. Right Atrium: Right atrial size was severely dilated. Pericardium: Trivial pericardial effusion is present. Mitral Valve: The mitral valve is normal in structure. There is mild thickening of the mitral valve leaflet(s). Mild mitral annular calcification. Moderate to severe mitral valve regurgitation, with centrally-directed jet. MV peak gradient, 11.2 mmHg. The mean mitral valve gradient is 3.0 mmHg. Tricuspid Valve: The tricuspid valve is normal in structure. Tricuspid valve regurgitation is moderate. Aortic Valve: The aortic valve is normal in structure. Aortic valve regurgitation is  mild. Aortic regurgitation PHT measures 973 msec. Aortic valve mean gradient measures 3.5 mmHg. Aortic valve peak gradient measures 6.9 mmHg. Aortic valve area, by VTI measures 1.82 cm. Pulmonic Valve: The pulmonic valve was normal in structure. Pulmonic valve regurgitation is mild. Aorta: The aortic root is normal in size and structure. Venous: The inferior vena cava is dilated in size with less than 50% respiratory variability, suggesting right atrial pressure of 15 mmHg. IAS/Shunts: No atrial level shunt detected by color flow Doppler.  LEFT VENTRICLE PLAX 2D LVIDd:         6.17 cm LVIDs:         4.98 cm LV PW:         1.43 cm LV IVS:        1.26 cm LVOT diam:     2.30 cm LV SV:          47 LV SV Index:   23 LVOT Area:     4.15 cm  LV Volumes (MOD) LV vol d, MOD A2C: 169.0 ml LV vol d, MOD A4C: 204.0 ml LV vol s, MOD A2C: 94.9 ml LV vol s, MOD A4C: 121.0 ml LV SV MOD A2C:     74.1 ml LV SV MOD A4C:     204.0 ml LV SV MOD BP:      76.8 ml RIGHT VENTRICLE            IVC RV Basal diam:  3.41 cm    IVC diam: 2.97 cm RV S prime:     6.37 cm/s TAPSE (M-mode): 1.4 cm LEFT ATRIUM              Index       RIGHT ATRIUM           Index LA diam:        4.70 cm  2.32 cm/m  RA Area:     29.70 cm LA Vol (A2C):   166.0 ml 81.80 ml/m RA Volume:   113.00 ml 55.68 ml/m LA Vol (A4C):   111.0 ml 54.70 ml/m LA Biplane Vol: 144.0 ml 70.96 ml/m  AORTIC VALVE AV Area (Vmax):    1.97 cm AV Area (Vmean):   1.74 cm AV Area (VTI):     1.82 cm AV Vmax:           131.50 cm/s AV Vmean:          88.850 cm/s AV VTI:            0.255 m AV Peak Grad:      6.9 mmHg AV Mean Grad:      3.5 mmHg LVOT Vmax:         62.50 cm/s LVOT Vmean:        37.300 cm/s LVOT VTI:          0.112 m LVOT/AV VTI ratio: 0.44 AI PHT:            973 msec  AORTA Ao Root diam: 3.70 cm Ao Asc diam:  3.70 cm MITRAL VALVE                 TRICUSPID VALVE MV Peak grad: 11.2 mmHg      TR Peak grad:   39.2 mmHg MV Mean grad: 3.0 mmHg       TR Vmax:        313.00 cm/s MV Vmax:      1.67 m/s MV Vmean:     67.4 cm/s  SHUNTS MR Peak grad:    107.7 mmHg  Systemic VTI:  0.11 m MR Mean grad:    70.0 mmHg   Systemic Diam: 2.30 cm MR Vmax:         519.00 cm/s MR Vmean:        391.0 cm/s MR PISA:         3.08 cm MR PISA Eff ROA: 18 mm MR PISA Radius:  0.70 cm Mihai Croitoru MD Electronically signed by Sanda Klein MD Signature Date/Time: 04/03/2020/3:28:58 PM    Final      Subjective: - no chest pain, shortness of breath, no abdominal pain, nausea or vomiting.  Wants to go home  Discharge Exam: BP 137/81 (BP Location: Left Arm)   Pulse 63   Temp 97.8 F (36.6 C) (Oral)   Resp 18   Ht 5\' 11"  (1.803 m)   Wt 83.3 kg   SpO2 100%   BMI 25.61  kg/m   General: Pt is alert, awake, not in acute distress Cardiovascular: RRR, S1/S2 +, no rubs, no gallops Respiratory: CTA bilaterally, no wheezing, no rhonchi Abdominal: Soft, NT, ND, bowel sounds + Extremities: no edema, no cyanosis    The results of significant diagnostics from this hospitalization (including imaging, microbiology, ancillary and laboratory) are listed below for reference.     Microbiology: Recent Results (from the past 240 hour(s))  Respiratory Panel by RT PCR (Flu A&B, Covid) - Nasopharyngeal Swab     Status: None   Collection Time: 03/30/20 10:44 PM   Specimen: Nasopharyngeal Swab  Result Value Ref Range Status   SARS Coronavirus 2 by RT PCR NEGATIVE NEGATIVE Final    Comment: (NOTE) SARS-CoV-2 target nucleic acids are NOT DETECTED. The SARS-CoV-2 RNA is generally detectable in upper respiratoy specimens during the acute phase of infection. The lowest concentration of SARS-CoV-2 viral copies this assay can detect is 131 copies/mL. A negative result does not preclude SARS-Cov-2 infection and should not be used as the sole basis for treatment or other patient management decisions. A negative result may occur with  improper specimen collection/handling, submission of specimen other than nasopharyngeal swab, presence of viral mutation(s) within the areas targeted by this assay, and inadequate number of viral copies (<131 copies/mL). A negative result must be combined with clinical observations, patient history, and epidemiological information. The expected result is Negative. Fact Sheet for Patients:  PinkCheek.be Fact Sheet for Healthcare Providers:  GravelBags.it This test is not yet ap proved or cleared by the [place] FDA and  has been authorized for detection and/or diagnosis of SARS-CoV-2 by FDA under an Emergency Use Authorization (EUA). This EUA will remain  in effect (meaning this test  can be used) for the duration of the COVID-19 declaration under Section 564(b)(1) of the Act, 21 U.S.C. section 360bbb-3(b)(1), unless the authorization is terminated or revoked sooner.    Influenza A by PCR NEGATIVE NEGATIVE Final   Influenza B by PCR NEGATIVE NEGATIVE Final    Comment: (NOTE) The Xpert Xpress SARS-CoV-2/FLU/RSV assay is intended as an aid in  the diagnosis of influenza from Nasopharyngeal swab specimens and  should not be used as a sole basis for treatment. Nasal washings and  aspirates are unacceptable for Xpert Xpress SARS-CoV-2/FLU/RSV  testing. Fact Sheet for Patients: PinkCheek.be Fact Sheet for Healthcare Providers: GravelBags.it This test is not yet approved or cleared by the [place] FDA and  has been authorized for detection and/or diagnosis of SARS-CoV-2 by  FDA under an Emergency Use  Authorization (EUA). This EUA will remain  in effect (meaning this test can be used) for the duration of the  Covid-19 declaration under Section 564(b)(1) of the Act, 21  U.S.C. section 360bbb-3(b)(1), unless the authorization is  terminated or revoked. Performed at [facility], [place] [address], [place], [place] [address]   MRSA PCR Screening     Status: None   Collection Time: 03/31/20  3:00 AM   Specimen: Nasal Mucosa; Nasopharyngeal  Result Value Ref Range Status   MRSA by PCR NEGATIVE NEGATIVE Final    Comment:        The GeneXpert MRSA Assay (FDA approved for NASAL specimens only), is one component of a comprehensive MRSA colonization surveillance program. It is not intended to diagnose MRSA infection nor to guide or monitor treatment for MRSA infections. Performed at [facility], [place] [address], [place], [place] [address]      Labs: Basic Metabolic Panel: Recent Labs  Lab 04/05/20 0005 04/05/20 0005 04/05/20 [id number] 04/05/20 1605 04/06/20 0208 04/07/20 0402 04/08/20 0243  NA  119*   < > 119* 121* 125* 131* 130*  K 4.6  --  4.6  --  4.8 4.6 4.6  CL 86*  --  86*  --  92* 97* 95*  CO2 21*  --  22  --  22 26 25   GLUCOSE 159*  --  143*  --  148* 132* 145*  BUN 27*  --  28*  --  38* 39* 36*  CREATININE 1.74*  --  1.82*  --  1.94* 1.91* 1.92*  CALCIUM 8.4*  --  8.3*  --  8.5* 8.8* 8.9  MG  --   --   --   --   --  1.7  --    < > = values in this interval not displayed.   Liver Function Tests: Recent Labs  Lab 04/06/20 0208 04/08/20 0243  AST 30 25  ALT 22 22  ALKPHOS 63 59  BILITOT 0.2* 0.3  PROT 6.4* 6.7  ALBUMIN 2.8* 2.9*   CBC: Recent Labs  Lab 04/06/20 0208 04/08/20 0243  WBC 3.7* 4.0  HGB 10.3* 10.7*  HCT 30.8* 32.3*  MCV 92.2 92.8  PLT 173 195   CBG: Recent Labs  Lab 04/07/20 1218 04/07/20 1737 04/07/20 1744 04/07/20 2137 04/08/20 0816  GLUCAP 154* 140* 141* 165* 129*   Hgb A1c No results for input(s): HGBA1C in the last 72 hours. Lipid Profile No results for input(s): CHOL, HDL, LDLCALC, TRIG, CHOLHDL, LDLDIRECT in the last 72 hours. Thyroid function studies No results for input(s): TSH, T4TOTAL, T3FREE, THYROIDAB in the last 72 hours.  Invalid input(s): FREET3 Urinalysis    Component Value Date/Time   COLORURINE STRAW (A) 03/30/2020 1928   APPEARANCEUR CLEAR 03/30/2020 1928   LABSPEC 1.002 (L) 03/30/2020 1928   PHURINE 6.0 03/30/2020 1928   GLUCOSEU NEGATIVE 03/30/2020 1928   HGBUR NEGATIVE 03/30/2020 1928   BILIRUBINUR NEGATIVE 03/30/2020 1928   BILIRUBINUR neg 05/08/2017 1503   KETONESUR NEGATIVE 03/30/2020 1928   PROTEINUR 30 (A) 03/30/2020 1928   UROBILINOGEN 0.2 05/08/2017 1503   UROBILINOGEN 1.0 04/26/2012 1328   NITRITE NEGATIVE 03/30/2020 1928   LEUKOCYTESUR NEGATIVE 03/30/2020 1928    FURTHER DISCHARGE INSTRUCTIONS:   Get Medicines reviewed and adjusted: Please take all your medications with you for your next visit with your Primary MD   Laboratory/radiological data: Please request your Primary MD to go  over all hospital tests and  procedure/radiological results at the follow up, please ask your Primary MD to get all Hospital records sent to his/her office.   In some cases, they will be blood work, cultures and biopsy results pending at the time of your discharge. Please request that your primary care M.D. goes through all the records of your hospital data and follows up on these results.   Also Note the following: If you experience worsening of your admission symptoms, develop shortness of breath, life threatening emergency, suicidal or homicidal thoughts you must seek medical attention immediately by calling 911 or calling your MD immediately  if symptoms less severe.   You must read complete instructions/literature along with all the possible adverse reactions/side effects for all the Medicines you take and that have been prescribed to you. Take any new Medicines after you have completely understood and accpet all the possible adverse reactions/side effects.    Do not drive when taking Pain medications or sleeping medications (Benzodaizepines)   Do not take more than prescribed Pain, Sleep and Anxiety Medications. It is not advisable to combine anxiety,sleep and pain medications without talking with your primary care practitioner   Special Instructions: If you have smoked or chewed Tobacco  in the last 2 yrs please stop smoking, stop any regular Alcohol  and or any Recreational drug use.   Wear Seat belts while driving.   Please note: You were cared for by a hospitalist during your hospital stay. Once you are discharged, your primary care physician will handle any further medical issues. Please note that NO REFILLS for any discharge medications will be authorized once you are discharged, as it is imperative that you return to your primary care physician (or establish a relationship with a primary care physician if you do not have one) for your post hospital discharge needs so that they can  reassess your need for medications and monitor your lab values.  Time coordinating discharge: 40 minutes  SIGNED:  Marzetta Board, MD, PhD 04/08/2020, 10:52 AM

## 2020-04-08 NOTE — Care Management (Signed)
Patient active w [facility]. Notified liaison that patient will DC today. Has needed DME at home. NO other CM needs identified.

## 2020-04-08 NOTE — Progress Notes (Signed)
Patient ID: Brad [name], male   DOB: 04/24/1961, 59 y.o.   MRN: [id number]     Advanced Heart Failure Rounding Note  PCP-Cardiologist: [name], MD   Subjective:    Sodium 129>122>123>119>125>131> 130    Received tolvaptan 4/14, 4/15, 4/16.   He feels good today and wants to go home.  He is drinking a lot of diet Coke this morning.  Says he peed very well last night.  Reportedly down 9 pounds but I's and O's do not match  Denies chest pain, orthopnea or PND.  Objective:   Weight Range: 83.3 kg Body mass index is 25.61 kg/m.   Vital Signs:   Temp:  [97.8 F (36.6 C)-98.1 F (36.7 C)] 97.8 F (36.6 C) (04/18 0818) Pulse Rate:  [59-67] 63 (04/18 0818) Resp:  [12-18] 18 (04/18 0818) BP: (137-155)/(80-97) 137/81 (04/18 0818) SpO2:  [99 %-100 %] 100 % (04/18 0818) Last BM Date: 04/07/20  Weight change: Filed Weights   04/05/20 0500 04/06/20 0500 04/07/20 0646  Weight: 89.1 kg 87.2 kg 83.3 kg    Intake/Output:   Intake/Output Summary (Last 24 hours) at 04/08/2020 0956 Last data filed at 04/08/2020 0902 Gross per 24 hour  Intake 500 ml  Output 5700 ml  Net -5200 ml      Physical Exam    General:  Sitting up on side of bed  No resp difficulty HEENT: normal Neck: supple. JVP 7Carotids 2+ bilat; no bruits. No lymphadenopathy or thryomegaly appreciated. Cor: PMI nondisplaced. Irregular rate & rhythm. No rubs, gallops or murmurs. Lungs: clear Abdomen: soft, nontender, nondistended. No hepatosplenomegaly. No bruits or masses. Good bowel sounds. Extremities: no cyanosis, clubbing, rash, trace edema Neuro: alert & orientedx3, cranial nerves grossly intact. moves all 4 extremities w/o difficulty. Affect pleasant    Telemetry   A fib 60s (Personally reviewed   EKG   n/a  Labs    CBC Recent Labs    04/06/20 0208 04/08/20 0243  WBC 3.7* 4.0  HGB 10.3* 10.7*  HCT 30.8* 32.3*  MCV 92.2 92.8  PLT 173 0000000   Basic Metabolic Panel Recent Labs   04/07/20 0402 04/08/20 0243  NA 131* 130*  K 4.6 4.6  CL 97* 95*  CO2 26 25  GLUCOSE 132* 145*  BUN 39* 36*  CREATININE 1.91* 1.92*  CALCIUM 8.8* 8.9  MG 1.7  --    Liver Function Tests Recent Labs    04/06/20 0208 04/08/20 0243  AST 30 25  ALT 22 22  ALKPHOS 63 59  BILITOT 0.2* 0.3  PROT 6.4* 6.7  ALBUMIN 2.8* 2.9*   No results for input(s): LIPASE, AMYLASE in the last 72 hours. Cardiac Enzymes No results for input(s): CKTOTAL, CKMB, CKMBINDEX, TROPONINI in the last 72 hours.  BNP: BNP (last 3 results) Recent Labs    01/01/20 0027 01/10/20 1622 01/16/20 1210  BNP 1,985.9* 1,118.5* 476.0*    ProBNP (last 3 results) No results for input(s): PROBNP in the last 8760 hours.   D-Dimer No results for input(s): DDIMER in the last 72 hours. Hemoglobin A1C No results for input(s): HGBA1C in the last 72 hours. Fasting Lipid Panel No results for input(s): CHOL, HDL, LDLCALC, TRIG, CHOLHDL, LDLDIRECT in the last 72 hours. Thyroid Function Tests No results for input(s): TSH, T4TOTAL, T3FREE, THYROIDAB in the last 72 hours.  Invalid input(s): FREET3  Other results:   Imaging    No results found.   Medications:     Scheduled Medications: . amitriptyline  20 mg Oral QHS  . atorvastatin  20 mg Oral Daily  . DULoxetine  60 mg Oral Daily  . folic acid  1 mg Oral Daily  . furosemide  80 mg Intravenous BID  . gabapentin  600 mg Oral Daily  . heparin  5,000 Units Subcutaneous Q8H  . insulin aspart  1-3 Units Subcutaneous TID WC  . isosorbide-hydrALAZINE  2 tablet Oral BID  . mirtazapine  15 mg Oral QHS  . spironolactone  25 mg Oral Daily  . thiamine  100 mg Oral Daily    Infusions:   PRN Medications: acetaminophen, albuterol, docusate sodium, nitroGLYCERIN, polyethylene glycol, traMADol     Assessment/Plan   1. Fall, ?syncope: Due to ETOH intoxication + hyponatremia.  2. Hyponatremia: Sodium 111 on admit. Volume overloaded + large po intake (beer  at home = beer potamania, water after admitted in hospital).  He had tolvaptan 4/14, 4/15, 4/16.  Na 131 today.  Suspect hypervolemic hyponatremia.  - Stressed need for free water and beer restriction  3. Acute on chronic systolic CHF: Echo (123456) EF 07/2019 EF 25-30%, moderately reduced RV fxn. Nonischemic cardiomyopathy, probably due to substance abuse (ETOH, prior cocaine).  Volume status looks much better.  He is likely near his baseline.  He is adamant about going home. - I think he is suitable for discharge today.  Discharge heart failure meds listed below. - He is off Coreg with bradycardia (HR in 40s) - Have not used ARB/ARNI with CKD. May be able to start carefully as outpatient.  4. Atrial fibrillation: Persistent.  He has been off anticoagulation due to heavy ETOH abuse and frequent falls.  - Off Coreg with low HR.  - Without anticoagulation, DCCV is not an option so stopped amiodarone.  - If he quits drinking and improves stability, start Eliquis.  Can reassess as an outpatient. 5. ETOH abuse: Ideally needs rehab program.  Stressed need for complete cessation.  He says he is quit cocaine and heroin in the past.  Cardiac meds for d/c  Torsemide 40 bid (previosuly on 40/20) Bidil 2 tabs tid (previously on 1 tab tid) Arlyce Harman 25 daily  Atorva 20   Stop: digoxin, amiodarone and potassium  F/u in HF Clinic  Length of Stay: [place], MD  04/08/2020, 9:56 AM  Advanced Heart Failure Team Pager [phone] (M-F; 7a - 4p)  Please contact [facility] for night-coverage after hours (4p -7a ) and weekends on [email or web address]

## 2020-04-08 NOTE — Plan of Care (Signed)
Care plan goals met. Pt adequate for discharge.  

## 2020-04-09 ENCOUNTER — Telehealth: Payer: Self-pay | Admitting: Hematology

## 2020-04-09 ENCOUNTER — Ambulatory Visit: Payer: Self-pay | Admitting: Podiatry

## 2020-04-09 ENCOUNTER — Telehealth: Payer: Self-pay

## 2020-04-09 NOTE — Telephone Encounter (Signed)
Brad [name] has been cld and rescheduled to see Dr. Irene Limbo on 5/5 at 1pm. Pt aware to arrive 15 minutes early.

## 2020-04-09 NOTE — Telephone Encounter (Signed)
Transition Care Management Follow-up Telephone Call  Date of discharge and from where: 04/08/2020, [facility]  How have you been since you were released from the hospital?  He stated he is feeling okay. No falls and no alcohol since he has been home.  He said that he will be refraining from alcohol going forward as he experienced DTs for the first time this hospitalization.  Any questions or concerns?  none at this time  Items Reviewed:  Did the pt receive and understand the discharge instructions provided?  yes  Medications obtained and verified? yes, he said he has all of his medications as he just picked some up today at his pharmacy.  He stated that he knows what he is supposed to take and what he is to stop taking.  He didn't have any questions and said he would review the meds with Nira Conn, EMT this week.   Any new allergies since your discharge?   None reported   Do you have support at home?  He lives with his friend, [name]  Other (ie: DME, [place], etc) home health ordered - SN[facility]PT with [facility].  He has not heard from them yet.  Receives weekly visits from [name], EMT to assist with medication management.   Has cane, walker and glucometer.   Functional Questionnaire: (I = Independent and D = Dependent) ADL's: independent   Follow up appointments reviewed:    PCP Hospital f/u appt confirmed? .scheduled appointment with Dr Margarita Rana  - 04/16/2020 @ 1050  Specialist appointments? has cardiology appointment 04/12/2020, needs to reschedule podiatry appointment that he missed today.  oncology appt - 04/25/2020  Are transportation arrangements needed?  no , his friend drives.  He is also eligible for medicaid transportation services  If their condition worsens, is the pt aware to call  their PCP or go to the ED?  yes  Was the patient provided with contact information for the PCP's office or ED?  he has the phone number for the clinic  Was the pt encouraged  to call back with questions or concerns?  yes

## 2020-04-09 NOTE — Telephone Encounter (Signed)
Transition Care Management Follow-up Telephone Call Date of discharge and from where: 04/08/2020, [facility]   Call placed to patient # [id number], message left with call back requested to this CM .  He needs to schedule follow up appointment with Dr Pollie Friar

## 2020-04-10 ENCOUNTER — Telehealth: Payer: Self-pay | Admitting: Family Medicine

## 2020-04-10 ENCOUNTER — Telehealth: Payer: Self-pay

## 2020-04-10 NOTE — Telephone Encounter (Signed)
Call returned to Brad [name] regarding PT orders/message left with call back requested to this CM.   Call placed to patient regarding his headaches.  He said he has been having headaches since he was in the hospital. He noted that he sometimes has problems with his eyesight. No nausea/vomiting reported.  Tramadol was prescribed at discharge but he said that is not helping. He would like to ask Dr Margarita Rana if there is something else he can take.  He does not want to go to the hospital.   He said he understands the importance of ceasing alcohol intake and is trying to refrain but it is very difficult for him.

## 2020-04-10 NOTE — Telephone Encounter (Signed)
Call received from Maryssa Giampietro Lew and give her approval for PT for twice a week x 3 weeks and informed her that this CM spoke to patient and message was sent to PCP regarding headaches

## 2020-04-10 NOTE — Telephone Encounter (Signed)
[facility] Tillie Rung [phone] wants Home health Pt for 3 weeks twice a week pt is having headaches as well

## 2020-04-11 MED ORDER — BUTALBITAL-APAP-CAFFEINE 50-325-40 MG PO TABS: 1.0000 | ORAL_TABLET | Freq: Two times a day (BID) | ORAL | 0 refills | Status: DC | PRN

## 2020-04-11 NOTE — Telephone Encounter (Signed)
I have sent a prescription for Fioricet to his pharmacy.

## 2020-04-11 NOTE — Telephone Encounter (Signed)
Call received from patient and informed him that Dr Margarita Rana sent a prescription for fioricet to his pharmacy.  He said he would call [facility] and check.   This CM stressed the importance of refraining from alcohol use and he said he understands. He also noted that he does not want to fall again.

## 2020-04-12 ENCOUNTER — Telehealth (HOSPITAL_COMMUNITY): Payer: Self-pay

## 2020-04-12 ENCOUNTER — Ambulatory Visit: Payer: Medicaid Other | Admitting: Family Medicine

## 2020-04-12 ENCOUNTER — Encounter (HOSPITAL_COMMUNITY): Payer: Medicaid Other

## 2020-04-12 NOTE — Telephone Encounter (Signed)
Spoke to Brad [name] who reports he has rescheduled appointments for Monday. I advised him the importance of making sure he is present at both HF clinic and [facility] appointments. He agreed and will bring medications for [place] to have our visit. Call complete.

## 2020-04-16 ENCOUNTER — Ambulatory Visit: Payer: Medicaid Other | Admitting: Family Medicine

## 2020-04-16 ENCOUNTER — Ambulatory Visit (HOSPITAL_COMMUNITY)
Admit: 2020-04-16 | Discharge: 2020-04-16 | Disposition: A | Discharge status: Home / Self Care | Payer: Medicaid Other | Source: Ambulatory Visit | Attending: Adult Health | Admitting: Adult Health

## 2020-04-16 ENCOUNTER — Ambulatory Visit (HOSPITAL_BASED_OUTPATIENT_CLINIC_OR_DEPARTMENT_OTHER): Payer: Medicaid Other | Admitting: Family Medicine

## 2020-04-16 ENCOUNTER — Other Ambulatory Visit: Payer: Self-pay

## 2020-04-16 ENCOUNTER — Telehealth (HOSPITAL_COMMUNITY): Payer: Self-pay | Admitting: Licensed Clinical Social Worker

## 2020-04-16 ENCOUNTER — Telehealth: Payer: Self-pay

## 2020-04-16 ENCOUNTER — Encounter: Payer: Self-pay | Admitting: Family Medicine

## 2020-04-16 ENCOUNTER — Encounter (HOSPITAL_COMMUNITY): Payer: Self-pay

## 2020-04-16 VITALS — BP 136/70 | HR 84 | Wt 170.0 lb

## 2020-04-16 VITALS — BP 146/86 | HR 74 | Ht 71.0 in | Wt 170.0 lb

## 2020-04-16 DIAGNOSIS — Z79899 Other long term (current) drug therapy: Secondary | ICD-10-CM | POA: Diagnosis not present

## 2020-04-16 DIAGNOSIS — E1140 Type 2 diabetes mellitus with diabetic neuropathy, unspecified: Secondary | ICD-10-CM

## 2020-04-16 DIAGNOSIS — I5022 Chronic systolic (congestive) heart failure: Secondary | ICD-10-CM

## 2020-04-16 DIAGNOSIS — N183 Chronic kidney disease, stage 3 unspecified: Secondary | ICD-10-CM | POA: Insufficient documentation

## 2020-04-16 DIAGNOSIS — I482 Chronic atrial fibrillation, unspecified: Secondary | ICD-10-CM | POA: Insufficient documentation

## 2020-04-16 DIAGNOSIS — I5042 Chronic combined systolic (congestive) and diastolic (congestive) heart failure: Secondary | ICD-10-CM | POA: Insufficient documentation

## 2020-04-16 DIAGNOSIS — K7030 Alcoholic cirrhosis of liver without ascites: Secondary | ICD-10-CM

## 2020-04-16 DIAGNOSIS — R400 Somnolence: Secondary | ICD-10-CM | POA: Diagnosis not present

## 2020-04-16 DIAGNOSIS — M1990 Unspecified osteoarthritis, unspecified site: Secondary | ICD-10-CM | POA: Diagnosis not present

## 2020-04-16 DIAGNOSIS — Z8546 Personal history of malignant neoplasm of prostate: Secondary | ICD-10-CM | POA: Diagnosis not present

## 2020-04-16 DIAGNOSIS — I110 Hypertensive heart disease with heart failure: Secondary | ICD-10-CM

## 2020-04-16 DIAGNOSIS — I428 Other cardiomyopathies: Secondary | ICD-10-CM | POA: Diagnosis not present

## 2020-04-16 DIAGNOSIS — E871 Hypo-osmolality and hyponatremia: Secondary | ICD-10-CM

## 2020-04-16 DIAGNOSIS — R42 Dizziness and giddiness: Secondary | ICD-10-CM | POA: Insufficient documentation

## 2020-04-16 DIAGNOSIS — Z888 Allergy status to other drugs, medicaments and biological substances status: Secondary | ICD-10-CM | POA: Insufficient documentation

## 2020-04-16 DIAGNOSIS — E1159 Type 2 diabetes mellitus with other circulatory complications: Secondary | ICD-10-CM | POA: Insufficient documentation

## 2020-04-16 DIAGNOSIS — E1122 Type 2 diabetes mellitus with diabetic chronic kidney disease: Secondary | ICD-10-CM | POA: Diagnosis not present

## 2020-04-16 DIAGNOSIS — R296 Repeated falls: Secondary | ICD-10-CM | POA: Insufficient documentation

## 2020-04-16 DIAGNOSIS — Z96652 Presence of left artificial knee joint: Secondary | ICD-10-CM | POA: Insufficient documentation

## 2020-04-16 DIAGNOSIS — Z794 Long term (current) use of insulin: Secondary | ICD-10-CM | POA: Diagnosis not present

## 2020-04-16 DIAGNOSIS — Z803 Family history of malignant neoplasm of breast: Secondary | ICD-10-CM | POA: Diagnosis not present

## 2020-04-16 DIAGNOSIS — I130 Hypertensive heart and chronic kidney disease with heart failure and stage 1 through stage 4 chronic kidney disease, or unspecified chronic kidney disease: Secondary | ICD-10-CM | POA: Insufficient documentation

## 2020-04-16 DIAGNOSIS — F329 Major depressive disorder, single episode, unspecified: Secondary | ICD-10-CM | POA: Insufficient documentation

## 2020-04-16 DIAGNOSIS — E119 Type 2 diabetes mellitus without complications: Secondary | ICD-10-CM | POA: Diagnosis present

## 2020-04-16 LAB — GLUCOSE, POCT (MANUAL RESULT ENTRY): POC Glucose: 167 mg/dl — AB (ref 70–99)

## 2020-04-16 MED ORDER — GABAPENTIN 300 MG PO CAPS: 600.0000 mg | ORAL_CAPSULE | Freq: Every day | ORAL | 3 refills | Status: DC

## 2020-04-16 MED ORDER — TORSEMIDE 20 MG PO TABS: 40.0000 mg | ORAL_TABLET | Freq: Two times a day (BID) | ORAL | 1 refills | Status: DC
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
## 2020-04-16 NOTE — Telephone Encounter (Signed)
CSW called pt this morning to ensure he remembers appts with CHW and HF clinic today.  Pt able to verbalize the appt times and confirms he has a ride to both those appts.  CSW will plan to meet with pt in person when he comes to HF clinic to follow up regarding substance abuse concerns.  CSW will continue to follow and assist as needed  [name], [facility] Desk#: [phone] Cell#: [phone]

## 2020-04-16 NOTE — Patient Instructions (Signed)
Increase Torsemide to 40 mg (2 tabs) in AM and 20 mg (1 tab) in PM  Your physician recommends that you schedule a follow-up appointment in: 2-3 weeks  If you have any questions or concerns before your next appointment please send [place] a message through [place] or call our office at [phone].  At the [facility], you and your health needs are our priority. As part of our continuing mission to provide you with exceptional heart care, we have created designated Provider Care Teams. These Care Teams include your primary Cardiologist (physician) and Advanced Practice Providers (APPs- Physician Assistants and Nurse Practitioners) who all work together to provide you with the care you need, when you need it.   You may see any of the following providers on your designated Care Team at your next follow up: Marland Kitchen Dr Glori Bickers . Dr Loralie Champagne . Darrick Grinder, NP . Lyda Jester, PA . Audry Riles, PharmD   Please be sure to bring in all your medications bottles to every appointment.

## 2020-04-16 NOTE — Progress Notes (Signed)
ReDS Vest / Clip - 04/16/20 1500      ReDS Vest / Clip   Station Marker  C    Ruler Value  29    ReDS Value Range  Low volume    ReDS Actual Value  26    Anatomical Comments  sitting

## 2020-04-16 NOTE — Progress Notes (Signed)
States that he has been sleeping a lot.

## 2020-04-16 NOTE — Telephone Encounter (Signed)
Dr Margarita Rana reduced his Gabapentin - took out the 600mg  morning dose so he would only do a nighttime dose of 600mg  due to somnolence.
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
## 2020-04-16 NOTE — Patient Instructions (Signed)
Dizziness Dizziness is a common problem. It makes you feel unsteady or light-headed. You may feel like you are about to pass out (faint). Dizziness can lead to getting hurt if you stumble or fall. Dizziness can be caused by many things, including:  Medicines.  Not having enough water in your body (dehydration).  Illness. Follow these instructions at home: Eating and drinking   Drink enough fluid to keep your pee (urine) clear or pale yellow. This helps to keep you from getting dehydrated. Try to drink more clear fluids, such as water.  Do not drink alcohol.  Limit how much caffeine you drink or eat, if your doctor tells you to do that.  Limit how much salt (sodium) you drink or eat, if your doctor tells you to do that. Activity   Avoid making quick movements. ? When you stand up from sitting in a chair, steady yourself until you feel okay. ? In the morning, first sit up on the side of the bed. When you feel okay, stand slowly while you hold onto something. Do this until you know that your balance is fine.  If you need to stand in one place for a long time, move your legs often. Tighten and relax the muscles in your legs while you are standing.  Do not drive or use heavy machinery if you feel dizzy.  Avoid bending down if you feel dizzy. Place items in your home so you can reach them easily without leaning over. Lifestyle  Do not use any products that contain nicotine or tobacco, such as cigarettes and e-cigarettes. If you need help quitting, ask your doctor.  Try to lower your stress level. You can do this by using methods such as yoga or meditation. Talk with your doctor if you need help. General instructions  Watch your dizziness for any changes.  Take over-the-counter and prescription medicines only as told by your doctor. Talk with your doctor if you think that you are dizzy because of a medicine that you are taking.  Tell a friend or a family member that you are feeling  dizzy. If he or she notices any changes in your behavior, have this person call your doctor.  Keep all follow-up visits as told by your doctor. This is important. Contact a doctor if:  Your dizziness does not go away.  Your dizziness or light-headedness gets worse.  You feel sick to your stomach (nauseous).  You have trouble hearing.  You have new symptoms.  You are unsteady on your feet.  You feel like the room is spinning. Get help right away if:  You throw up (vomit) or have watery poop (diarrhea), and you cannot eat or drink anything.  You have trouble: ? Talking. ? Walking. ? Swallowing. ? Using your arms, hands, or legs.  You feel generally weak.  You are not thinking clearly, or you have trouble forming sentences. A friend or family member may notice this.  You have: ? Chest pain. ? Pain in your belly (abdomen). ? Shortness of breath. ? Sweating.  Your vision changes.  You are bleeding.  You have a very bad headache.  You have neck pain or a stiff neck.  You have a fever. These symptoms may be an emergency. Do not wait to see if the symptoms will go away. Get medical help right away. Call your local emergency services (911 in the [place] Do not drive yourself to the hospital. Summary  Dizziness makes you feel unsteady or light-headed. You   may feel like you are about to pass out (faint).  Drink enough fluid to keep your pee (urine) clear or pale yellow. Do not drink alcohol.  Avoid making quick movements if you feel dizzy.  Watch your dizziness for any changes. This information is not intended to replace advice given to you by your health care provider. Make sure you discuss any questions you have with your health care provider. Document Revised: 12/11/2017 Document Reviewed: 12/25/2016 Elsevier Patient Education  2020 [employer]  

## 2020-04-16 NOTE — Progress Notes (Signed)
Met with Brad [name] in clinic today who was very depressed and upset. Brad [name] stated that he is going through withdrawal from alcohol and is having a very hard time with it. He stated he feels awful and has headaches and feels very weak. Providers at clinic feel he does not require inpatient facility at present but patient is agreeable to outpatient therapy and grief counseling due to loss of multiple family members over the last few months. Brad [name] did not bring his medications today and reports his home health nurse did it. I will go out this week to verify meds and check in with patient. Patient agreed. PA Brad [name] changed patient's torsemide to 62m in the morning and 229min the evening. [name] same. Clinic and I encouraged patient to eat hearty meals and drink water. He agreed. Clinic visit complete. I will follow up with patient.

## 2020-04-16 NOTE — Progress Notes (Signed)
Heart and Vascular Care Navigation  04/16/2020  Brad [name] [birth year] [id number]  Reason for Referral: Alcohol Abuse                                                                                                    Assessment:     CSW met with pt to discuss current alcohol concerns and assess pt willingness to change.  Pt endorses many years of heavy drinking- states he has never tried to quit before.  Currently pt has been without a drink for 1 week- reports leaving the hospital and drinking some after going home but has since stopped again.   Per pt report he is going through withdrawals- feels poorly, low appetite, etc but no immediate concerns with withdrawal per medical team.   Pt became tearful during conversation and admits to feeling very depressed after the loss of several family members over the past several months.                                HRT/VAS Care Coordination    Patients Home Cardiology Office  [facility] Failure Clinic   Outpatient Care Team  RN Care Manager; Social Worker; [employer] Paramedic Name:  Brad Penta   RN Care Manager Name:  Brad [name]- [phone]   Social Worker Name:  Brad [name]- [facility]- [phone]   Living arrangements for the past 2 months  Apartment   Lives with:  Brad [name] Devices/Equipment  CBG Meter; Gilford Rile (specify type); Cane (specify quad or straight)   DME Agency  NA   [place]  [facility]   Current home services  DME      Social History:                                                                             SDOH Screenings   Alcohol Screen: Medium Risk  . Last Alcohol Screening Score (AUDIT): 20  Depression (PHQ2-9): Medium Risk  . PHQ-2 Score: 12  Financial Resource Strain:   . Difficulty of Paying Living Expenses:   Food Insecurity: No Food Insecurity  . Worried About Charity fundraiser in the Last Year: Never true  . Ran Out of Food in the Last Year:  Never true  Housing: High Risk  . Last Housing Risk Score: 3  Physical Activity:   . Days of Exercise per Week:   . Minutes of Exercise per Session:   Social Connections:   . Frequency of Communication with Friends and Family:   . Frequency of Social Gatherings with Friends and Family:   . Attends Religious Services:   . Active Member of Clubs or Organizations:   . Attends Club  or Organization Meetings:   Marland Kitchen Marital Status:   Stress:   . Feeling of Stress :   Tobacco Use: High Risk  . Smoking Tobacco Use: Current Every Day Smoker  . Smokeless Tobacco Use: Never Used  Transportation Needs:   . Film/video editor (Medical):   Marland Kitchen Lack of Transportation (Non-Medical):     Other Care Navigation Interventions:     Inpatient/Outpatient Substance Abuse Counseling/Rehab Options CSW will assist pt in finding individual substance abuse counselor to help aid him in quitting.  Discussed possible inpatient stay but pt feels comfortable managing at home and reports good support from his roommate and friend.  Patient expressed Mental Health concerns Yes, Referred to:  [facility] grief counseling   Follow-up plan:  CSW to place referral to mental health/substance abuse resources and help to ensure contact is made.  Brad Ny, LCSW Clinical Social Worker [facility] Desk#: [phone] Cell#: [phone]

## 2020-04-18 ENCOUNTER — Encounter (HOSPITAL_COMMUNITY): Payer: Self-pay | Admitting: Emergency Medicine

## 2020-04-18 ENCOUNTER — Inpatient Hospital Stay (HOSPITAL_COMMUNITY)
Admission: EM | Admit: 2020-04-18 | Discharge: 2020-04-23 | DRG: 641 | Disposition: A | Discharge status: Home Health | Payer: Medicaid Other | Attending: Internal Medicine | Admitting: Internal Medicine

## 2020-04-18 ENCOUNTER — Telehealth (HOSPITAL_COMMUNITY): Payer: Self-pay | Admitting: Licensed Clinical Social Worker

## 2020-04-18 ENCOUNTER — Emergency Department (HOSPITAL_COMMUNITY): Payer: Medicaid Other

## 2020-04-18 ENCOUNTER — Other Ambulatory Visit: Payer: Self-pay

## 2020-04-18 DIAGNOSIS — I5042 Chronic combined systolic (congestive) and diastolic (congestive) heart failure: Secondary | ICD-10-CM | POA: Diagnosis present

## 2020-04-18 DIAGNOSIS — F1410 Cocaine abuse, uncomplicated: Secondary | ICD-10-CM | POA: Diagnosis present

## 2020-04-18 DIAGNOSIS — E871 Hypo-osmolality and hyponatremia: Principal | ICD-10-CM | POA: Diagnosis present

## 2020-04-18 DIAGNOSIS — Z8546 Personal history of malignant neoplasm of prostate: Secondary | ICD-10-CM

## 2020-04-18 DIAGNOSIS — I4891 Unspecified atrial fibrillation: Secondary | ICD-10-CM

## 2020-04-18 DIAGNOSIS — E785 Hyperlipidemia, unspecified: Secondary | ICD-10-CM | POA: Diagnosis present

## 2020-04-18 DIAGNOSIS — N179 Acute kidney failure, unspecified: Secondary | ICD-10-CM | POA: Diagnosis present

## 2020-04-18 DIAGNOSIS — G8929 Other chronic pain: Secondary | ICD-10-CM | POA: Diagnosis present

## 2020-04-18 DIAGNOSIS — Z79899 Other long term (current) drug therapy: Secondary | ICD-10-CM

## 2020-04-18 DIAGNOSIS — I4821 Permanent atrial fibrillation: Secondary | ICD-10-CM | POA: Diagnosis present

## 2020-04-18 DIAGNOSIS — Z20822 Contact with and (suspected) exposure to covid-19: Secondary | ICD-10-CM | POA: Diagnosis present

## 2020-04-18 DIAGNOSIS — R296 Repeated falls: Secondary | ICD-10-CM | POA: Diagnosis present

## 2020-04-18 DIAGNOSIS — Z8249 Family history of ischemic heart disease and other diseases of the circulatory system: Secondary | ICD-10-CM

## 2020-04-18 DIAGNOSIS — I252 Old myocardial infarction: Secondary | ICD-10-CM

## 2020-04-18 DIAGNOSIS — B182 Chronic viral hepatitis C: Secondary | ICD-10-CM | POA: Diagnosis present

## 2020-04-18 DIAGNOSIS — E119 Type 2 diabetes mellitus without complications: Secondary | ICD-10-CM

## 2020-04-18 DIAGNOSIS — M1990 Unspecified osteoarthritis, unspecified site: Secondary | ICD-10-CM | POA: Diagnosis present

## 2020-04-18 DIAGNOSIS — Z9119 Patient's noncompliance with other medical treatment and regimen: Secondary | ICD-10-CM

## 2020-04-18 DIAGNOSIS — E1140 Type 2 diabetes mellitus with diabetic neuropathy, unspecified: Secondary | ICD-10-CM | POA: Diagnosis present

## 2020-04-18 DIAGNOSIS — N183 Chronic kidney disease, stage 3 unspecified: Secondary | ICD-10-CM | POA: Diagnosis present

## 2020-04-18 DIAGNOSIS — Z888 Allergy status to other drugs, medicaments and biological substances status: Secondary | ICD-10-CM

## 2020-04-18 DIAGNOSIS — R072 Precordial pain: Secondary | ICD-10-CM

## 2020-04-18 DIAGNOSIS — N1831 Chronic kidney disease, stage 3a: Secondary | ICD-10-CM | POA: Diagnosis present

## 2020-04-18 DIAGNOSIS — K7030 Alcoholic cirrhosis of liver without ascites: Secondary | ICD-10-CM | POA: Diagnosis present

## 2020-04-18 DIAGNOSIS — R079 Chest pain, unspecified: Secondary | ICD-10-CM | POA: Diagnosis present

## 2020-04-18 DIAGNOSIS — Z8673 Personal history of transient ischemic attack (TIA), and cerebral infarction without residual deficits: Secondary | ICD-10-CM

## 2020-04-18 DIAGNOSIS — K219 Gastro-esophageal reflux disease without esophagitis: Secondary | ICD-10-CM | POA: Diagnosis present

## 2020-04-18 DIAGNOSIS — I130 Hypertensive heart and chronic kidney disease with heart failure and stage 1 through stage 4 chronic kidney disease, or unspecified chronic kidney disease: Secondary | ICD-10-CM | POA: Diagnosis present

## 2020-04-18 DIAGNOSIS — Z720 Tobacco use: Secondary | ICD-10-CM | POA: Diagnosis present

## 2020-04-18 DIAGNOSIS — I2510 Atherosclerotic heart disease of native coronary artery without angina pectoris: Secondary | ICD-10-CM | POA: Diagnosis present

## 2020-04-18 DIAGNOSIS — I482 Chronic atrial fibrillation, unspecified: Secondary | ICD-10-CM | POA: Diagnosis present

## 2020-04-18 DIAGNOSIS — I10 Essential (primary) hypertension: Secondary | ICD-10-CM | POA: Diagnosis present

## 2020-04-18 DIAGNOSIS — F1020 Alcohol dependence, uncomplicated: Secondary | ICD-10-CM | POA: Diagnosis present

## 2020-04-18 DIAGNOSIS — E1122 Type 2 diabetes mellitus with diabetic chronic kidney disease: Secondary | ICD-10-CM | POA: Diagnosis present

## 2020-04-18 DIAGNOSIS — F17210 Nicotine dependence, cigarettes, uncomplicated: Secondary | ICD-10-CM | POA: Diagnosis present

## 2020-04-18 DIAGNOSIS — R197 Diarrhea, unspecified: Secondary | ICD-10-CM | POA: Diagnosis present

## 2020-04-18 DIAGNOSIS — Z87892 Personal history of anaphylaxis: Secondary | ICD-10-CM

## 2020-04-18 DIAGNOSIS — I428 Other cardiomyopathies: Secondary | ICD-10-CM | POA: Diagnosis present

## 2020-04-18 LAB — BASIC METABOLIC PANEL
BUN/Creatinine Ratio: 14 (ref 10–24)
BUN: 18 mg/dL (ref 8–27)
CO2: 16 mmol/L — ABNORMAL LOW (ref 20–29)
Calcium: 9.3 mg/dL (ref 8.6–10.2)
Chloride: 89 mmol/L — ABNORMAL LOW (ref 96–106)
Creatinine, Ser: 1.32 mg/dL — ABNORMAL HIGH (ref 0.76–1.27)
GFR calc Af Amer: 67 mL/min/{1.73_m2} (ref >59)
GFR calc non Af Amer: 58 mL/min/{1.73_m2} — ABNORMAL LOW (ref >59)
Glucose: 118 mg/dL — ABNORMAL HIGH (ref 65–99)
Potassium: 4.7 mmol/L (ref 3.5–5.2)
Sodium: 123 mmol/L — ABNORMAL LOW (ref 134–144)

## 2020-04-18 LAB — CBC
HCT: 38 % — ABNORMAL LOW (ref 39.0–52.0)
Hemoglobin: 13.3 g/dL (ref 13.0–17.0)
MCH: 30.2 pg (ref 26.0–34.0)
MCHC: 35 g/dL (ref 30.0–36.0)
MCV: 86.4 fL (ref 80.0–100.0)
Platelets: 307 10*3/uL (ref 150–400)
RBC: 4.4 MIL/uL (ref 4.22–5.81)
RDW: 12.8 % (ref 11.5–15.5)
WBC: 2.7 10*3/uL — ABNORMAL LOW (ref 4.0–10.5)
nRBC: 0 % (ref 0.0–0.2)

## 2020-04-18 LAB — AMMONIA: Ammonia: 131 ug/dL (ref 40–200)

## 2020-04-18 LAB — ETHANOL: Ethanol: 0.077 %

## 2020-04-18 IMAGING — DX CHEST - 2 VIEW
2 series · 2 of 2 positions shown · non-contrast
Comparison: 03/23/2019

CLINICAL DATA: CHF.  Short of breath when laying down.

EXAM:
CHEST - 2 VIEW

[chest pa]
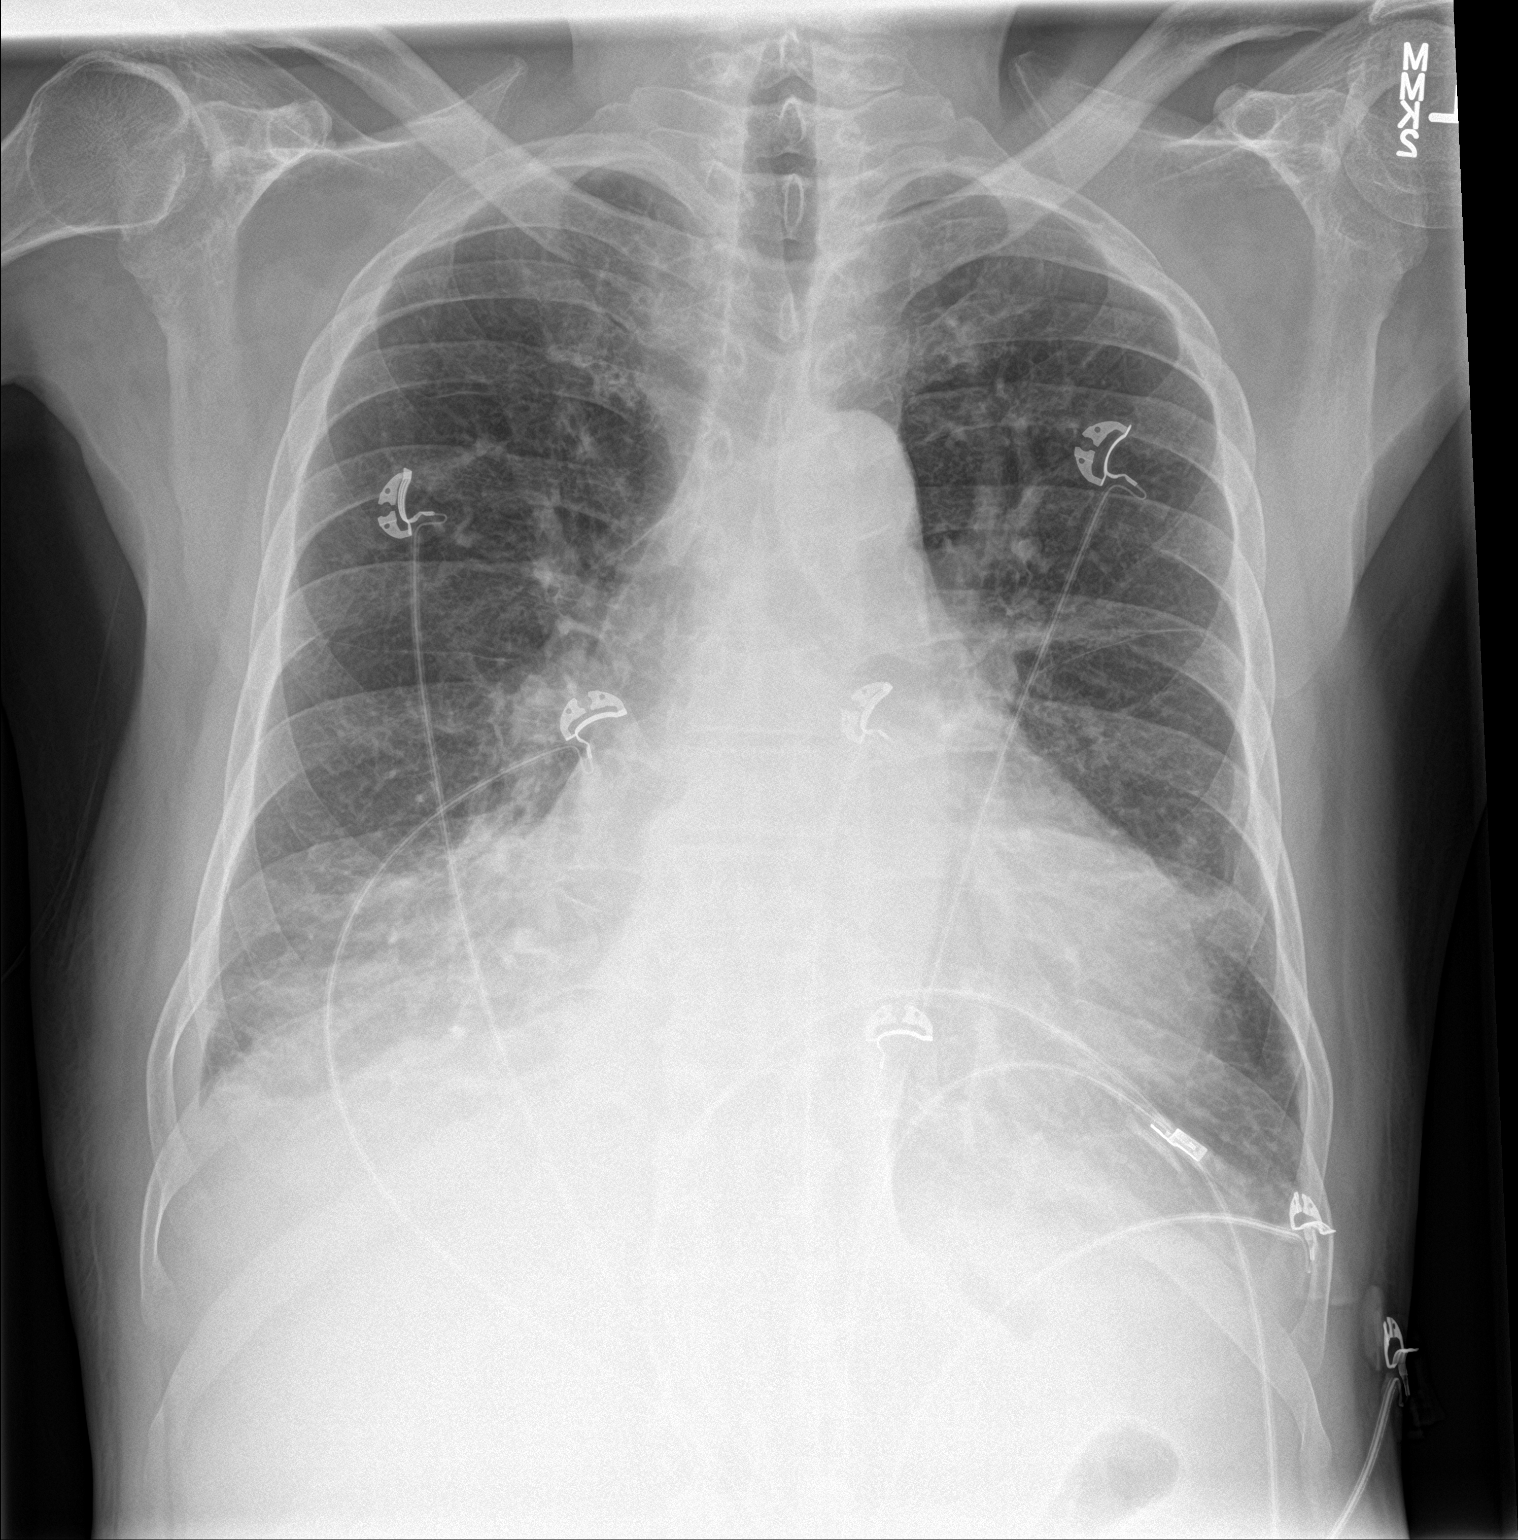

[chest lat]
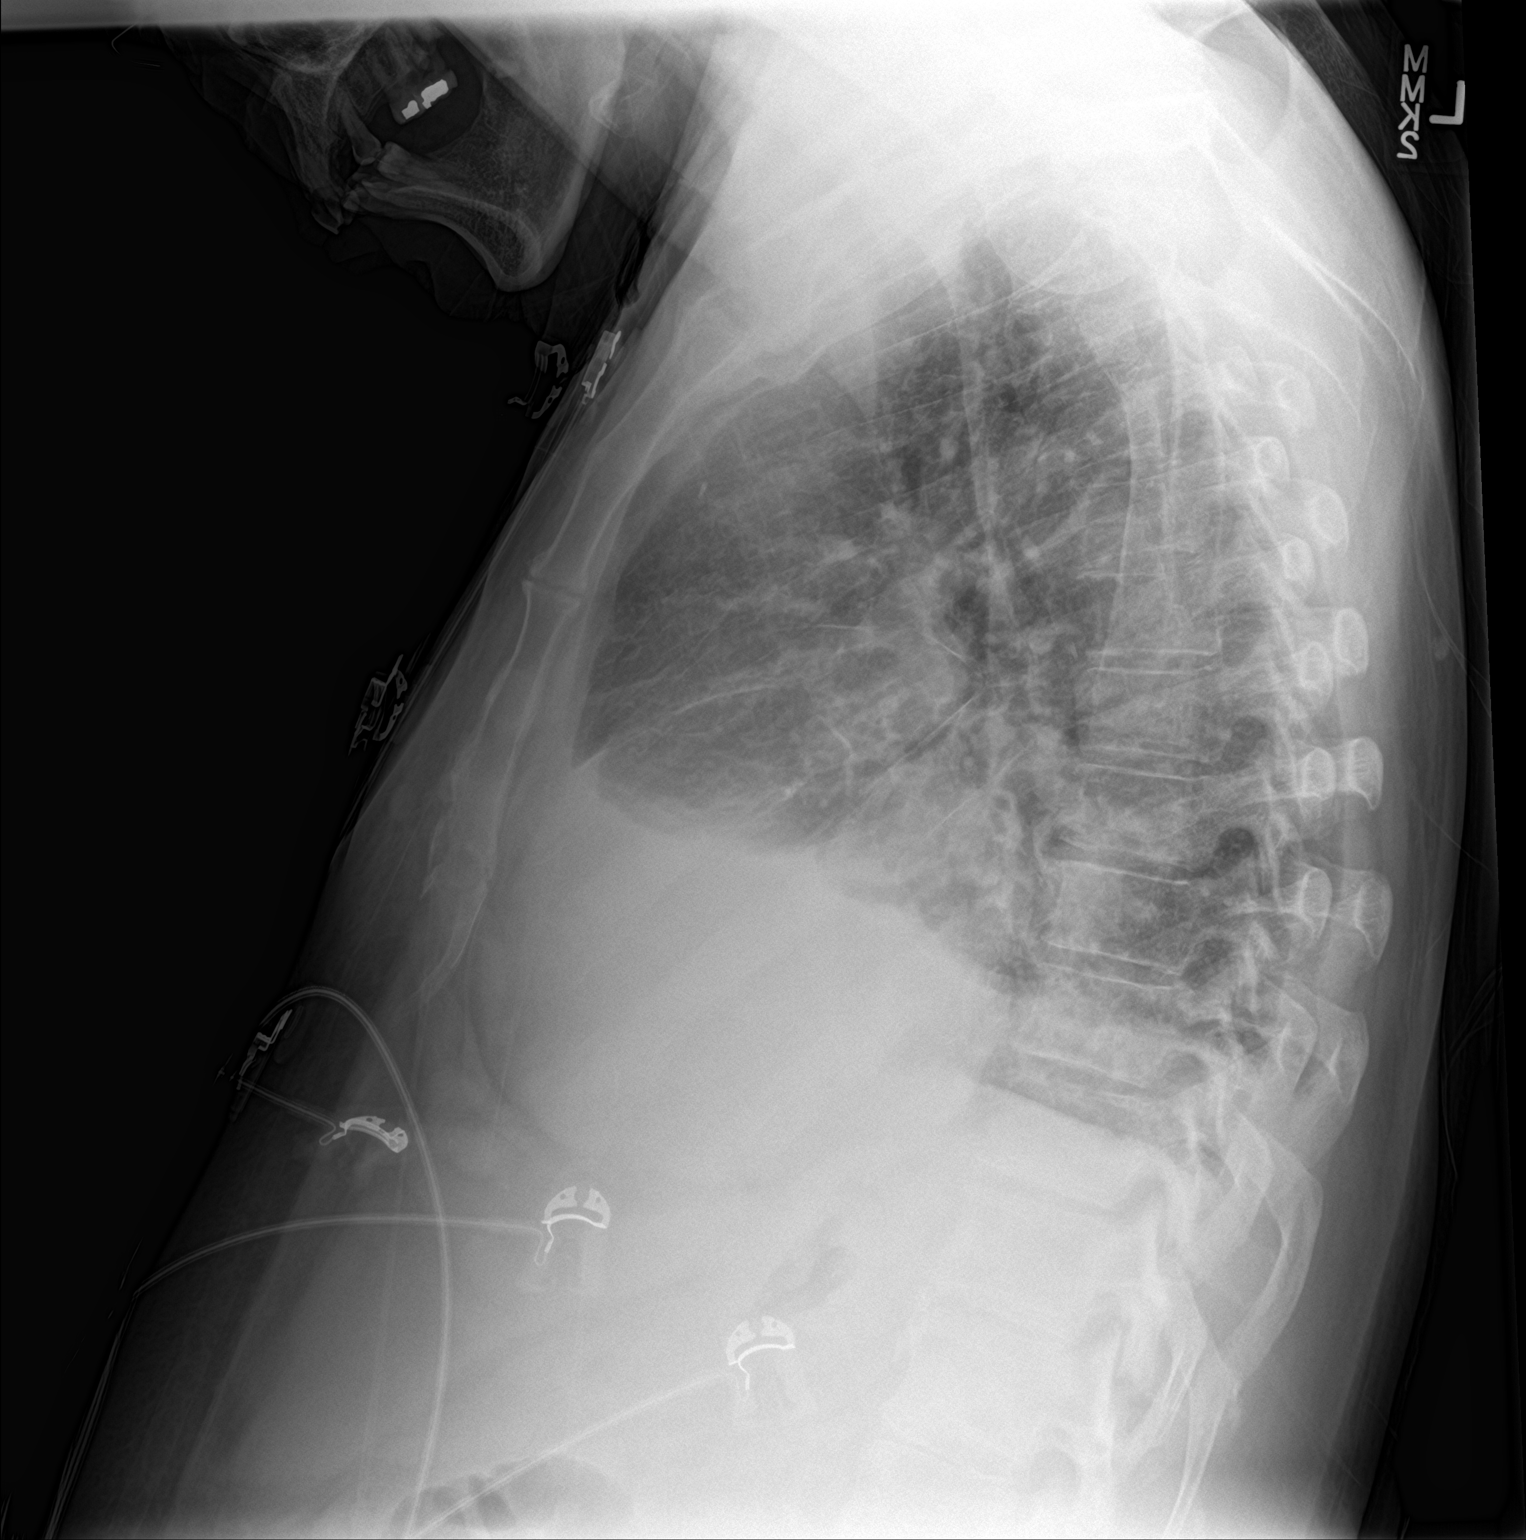

[2 of 2 positions shown; findings below may reference images not displayed]

FINDINGS: Moderate cardiomegaly. Pulmonary vascularity is within normal
limits. Heterogeneous opacities at both lung bases. Small right
pleural effusion. No pneumothorax.
IMPRESSION: Bibasilar opacities are compatible with bibasilar pneumonia versus
pulmonary edema.

Small right pleural effusion.

## 2020-04-18 MED ORDER — SODIUM CHLORIDE 0.9% FLUSH
3.0000 mL | Freq: Once | INTRAVENOUS | Status: AC
  Administered 2020-04-19: 3 mL via INTRAVENOUS

## 2020-04-18 NOTE — Telephone Encounter (Signed)
CSW following up on outpatient individual counseling referrals for pt alcohol abuse and suspected depression:  Contacted the following agencies:  [facility]- informed that it would be a long time to get an appt for substance abuse and medicaid and referred CSW to below agency who they report take medicaid  [facility]- [phone]- left message requesting return call  [facility]- left message requesting return call to discuss referral  [facility]- confirmed they can take Medicaid and supply substance abuse counseling- would need CSW to send in facesheet with payor information and recent medical note- Fax: [phone]  Pt agreeable to CSW referring to these agencies and having them reach out directly to discuss further  CSW will place appropriate referrals and assist in follow up  Kristalynn Coddington H. Jozelynn Danielson, LCSW Clinical Social Worker [facility] Desk#: [phone] Cell#: [phone] 

## 2020-04-18 NOTE — ED Triage Notes (Signed)
Per [facility] pt from home began experiencing centeral non-radiating sharp chest pain an hour ago along w/ diarrhea.  Pt is a CHF clinic pt, given 324ASA and 1 nitro w/ no change to 10/10 pain.  No edema noted  158/98 70hr 18RR  99% RA

## 2020-04-19 ENCOUNTER — Encounter (HOSPITAL_COMMUNITY): Payer: Self-pay | Admitting: Internal Medicine

## 2020-04-19 ENCOUNTER — Telehealth: Payer: Self-pay | Admitting: Family Medicine

## 2020-04-19 DIAGNOSIS — K591 Functional diarrhea: Secondary | ICD-10-CM

## 2020-04-19 DIAGNOSIS — I5042 Chronic combined systolic (congestive) and diastolic (congestive) heart failure: Secondary | ICD-10-CM | POA: Diagnosis present

## 2020-04-19 DIAGNOSIS — Z720 Tobacco use: Secondary | ICD-10-CM

## 2020-04-19 DIAGNOSIS — I482 Chronic atrial fibrillation, unspecified: Secondary | ICD-10-CM | POA: Diagnosis not present

## 2020-04-19 DIAGNOSIS — I10 Essential (primary) hypertension: Secondary | ICD-10-CM

## 2020-04-19 DIAGNOSIS — E871 Hypo-osmolality and hyponatremia: Secondary | ICD-10-CM | POA: Diagnosis not present

## 2020-04-19 DIAGNOSIS — R0789 Other chest pain: Secondary | ICD-10-CM | POA: Diagnosis not present

## 2020-04-19 LAB — BASIC METABOLIC PANEL
Anion gap: 11 (ref 5–15)
Anion gap: 9 (ref 5–15)
Anion gap: 10 (ref 5–15)
BUN: 13 mg/dL (ref 6–20)
BUN: 14 mg/dL (ref 6–20)
BUN: 16 mg/dL (ref 6–20)
CO2: 22 mmol/L (ref 22–32)
CO2: 23 mmol/L (ref 22–32)
CO2: 23 mmol/L (ref 22–32)
Calcium: 8.9 mg/dL (ref 8.9–10.3)
Calcium: 9 mg/dL (ref 8.9–10.3)
Calcium: 9.2 mg/dL (ref 8.9–10.3)
Chloride: 86 mmol/L — ABNORMAL LOW (ref 98–111)
Chloride: 89 mmol/L — ABNORMAL LOW (ref 98–111)
Chloride: 88 mmol/L — ABNORMAL LOW (ref 98–111)
Creatinine, Ser: 1.4 mg/dL — ABNORMAL HIGH (ref 0.61–1.24)
Creatinine, Ser: 1.43 mg/dL — ABNORMAL HIGH (ref 0.61–1.24)
Creatinine, Ser: 1.6 mg/dL — ABNORMAL HIGH (ref 0.61–1.24)
GFR calc Af Amer: >60 mL/min (ref >60)
GFR calc Af Amer: >60 mL/min (ref >60)
GFR calc Af Amer: 53 mL/min — ABNORMAL LOW (ref >60)
GFR calc non Af Amer: 54 mL/min — ABNORMAL LOW (ref >60)
GFR calc non Af Amer: 53 mL/min — ABNORMAL LOW (ref >60)
GFR calc non Af Amer: 46 mL/min — ABNORMAL LOW (ref >60)
Glucose, Bld: 138 mg/dL — ABNORMAL HIGH (ref 70–99)
Glucose, Bld: 140 mg/dL — ABNORMAL HIGH (ref 70–99)
Glucose, Bld: 132 mg/dL — ABNORMAL HIGH (ref 70–99)
Potassium: 3.9 mmol/L (ref 3.5–5.1)
Potassium: 4.1 mmol/L (ref 3.5–5.1)
Potassium: 4.1 mmol/L (ref 3.5–5.1)
Sodium: 119 mmol/L — CL (ref 135–145)
Sodium: 121 mmol/L — ABNORMAL LOW (ref 135–145)
Sodium: 121 mmol/L — ABNORMAL LOW (ref 135–145)

## 2020-04-19 LAB — LIPID PANEL
Cholesterol: 206 mg/dL — ABNORMAL HIGH (ref 0–200)
HDL: 104 mg/dL (ref >40)
LDL Cholesterol: 86 mg/dL (ref 0–99)
Total CHOL/HDL Ratio: 2 RATIO
Triglycerides: 78 mg/dL (ref <150)
VLDL: 16 mg/dL (ref 0–40)

## 2020-04-19 LAB — GLUCOSE, CAPILLARY
Glucose-Capillary: 149 mg/dL — ABNORMAL HIGH (ref 70–99)
Glucose-Capillary: 137 mg/dL — ABNORMAL HIGH (ref 70–99)

## 2020-04-19 LAB — TROPONIN I (HIGH SENSITIVITY)
Troponin I (High Sensitivity): 38 ng/L — ABNORMAL HIGH (ref <18)
Troponin I (High Sensitivity): 37 ng/L — ABNORMAL HIGH (ref <18)
Troponin I (High Sensitivity): 31 ng/L — ABNORMAL HIGH (ref <18)

## 2020-04-19 LAB — ETHANOL: Alcohol, Ethyl (B): <10 mg/dL (ref <10)

## 2020-04-19 LAB — SARS CORONAVIRUS 2 (TAT 6-24 HRS): SARS Coronavirus 2: NEGATIVE

## 2020-04-19 LAB — CBG MONITORING, ED
Glucose-Capillary: 139 mg/dL — ABNORMAL HIGH (ref 70–99)
Glucose-Capillary: 131 mg/dL — ABNORMAL HIGH (ref 70–99)

## 2020-04-19 MED ORDER — ISOSORB DINITRATE-HYDRALAZINE 20-37.5 MG PO TABS
2.0000 | ORAL_TABLET | Freq: Three times a day (TID) | ORAL | Status: DC
  Administered 2020-04-19 – 2020-04-22 (×12): 2 via ORAL
  Filled 2020-04-19 (×15): qty 2

## 2020-04-19 MED ORDER — FOLIC ACID 1 MG PO TABS
1.0000 mg | ORAL_TABLET | Freq: Every day | ORAL | Status: DC
  Administered 2020-04-19 – 2020-04-22 (×4): 1 mg via ORAL
  Filled 2020-04-19 (×4): qty 1

## 2020-04-19 MED ORDER — ENOXAPARIN SODIUM 40 MG/0.4ML ~~LOC~~ SOLN
40.0000 mg | SUBCUTANEOUS | Status: DC
  Administered 2020-04-19 – 2020-04-22 (×4): 40 mg via SUBCUTANEOUS
  Filled 2020-04-19 (×4): qty 0.4

## 2020-04-19 MED ORDER — LORAZEPAM 1 MG PO TABS: 0.0000 mg | ORAL_TABLET | Freq: Four times a day (QID) | ORAL | Status: DC

## 2020-04-19 MED ORDER — THIAMINE HCL 100 MG PO TABS: 100.0000 mg | ORAL_TABLET | Freq: Every day | ORAL | Status: DC

## 2020-04-19 MED ORDER — THIAMINE HCL 100 MG PO TABS
100.0000 mg | ORAL_TABLET | Freq: Every day | ORAL | Status: DC
  Administered 2020-04-19 – 2020-04-22 (×4): 100 mg via ORAL
  Filled 2020-04-19 (×4): qty 1

## 2020-04-19 MED ORDER — SPIRONOLACTONE 25 MG PO TABS
25.0000 mg | ORAL_TABLET | Freq: Every day | ORAL | Status: DC
  Administered 2020-04-19 – 2020-04-20 (×2): 25 mg via ORAL
  Filled 2020-04-19 (×3): qty 1

## 2020-04-19 MED ORDER — ACETAMINOPHEN 325 MG PO TABS: 650.0000 mg | ORAL_TABLET | ORAL | Status: DC | PRN

## 2020-04-19 MED ORDER — THIAMINE HCL 100 MG/ML IJ SOLN: 100.0000 mg | Freq: Every day | INTRAMUSCULAR | Status: DC

## 2020-04-19 MED ORDER — ATORVASTATIN CALCIUM 10 MG PO TABS
20.0000 mg | ORAL_TABLET | Freq: Every day | ORAL | Status: DC
  Administered 2020-04-19 – 2020-04-22 (×4): 20 mg via ORAL
  Filled 2020-04-19 (×4): qty 2

## 2020-04-19 MED ORDER — LORAZEPAM 1 MG PO TABS: 1.0000 mg | ORAL_TABLET | ORAL | Status: AC | PRN

## 2020-04-19 MED ORDER — LORAZEPAM 2 MG/ML IJ SOLN: 0.0000 mg | Freq: Four times a day (QID) | INTRAMUSCULAR | Status: DC

## 2020-04-19 MED ORDER — ONDANSETRON HCL 4 MG/2ML IJ SOLN: 4.0000 mg | Freq: Four times a day (QID) | INTRAMUSCULAR | Status: DC | PRN

## 2020-04-19 MED ORDER — LORAZEPAM 1 MG PO TABS: 0.0000 mg | ORAL_TABLET | Freq: Two times a day (BID) | ORAL | Status: DC

## 2020-04-19 MED ORDER — ADULT MULTIVITAMIN W/MINERALS CH
1.0000 | ORAL_TABLET | Freq: Every day | ORAL | Status: DC
  Administered 2020-04-19 – 2020-04-22 (×4): 1 via ORAL
  Filled 2020-04-19 (×4): qty 1

## 2020-04-19 MED ORDER — GABAPENTIN 300 MG PO CAPS
600.0000 mg | ORAL_CAPSULE | Freq: Every day | ORAL | Status: DC
  Administered 2020-04-19 – 2020-04-22 (×4): 600 mg via ORAL
  Filled 2020-04-19 (×4): qty 2

## 2020-04-19 MED ORDER — LORAZEPAM 2 MG/ML IJ SOLN: 1.0000 mg | INTRAMUSCULAR | Status: AC | PRN

## 2020-04-19 MED ORDER — ALBUTEROL SULFATE (2.5 MG/3ML) 0.083% IN NEBU: 3.0000 mL | INHALATION_SOLUTION | Freq: Four times a day (QID) | RESPIRATORY_TRACT | Status: DC | PRN

## 2020-04-19 MED ORDER — DULOXETINE HCL 60 MG PO CPEP
60.0000 mg | ORAL_CAPSULE | Freq: Every day | ORAL | Status: DC
  Filled 2020-04-19: qty 1

## 2020-04-19 MED ORDER — KETOROLAC TROMETHAMINE 15 MG/ML IJ SOLN
15.0000 mg | Freq: Once | INTRAMUSCULAR | Status: AC
  Administered 2020-04-19: 15 mg via INTRAVENOUS
  Filled 2020-04-19: qty 1

## 2020-04-19 MED ORDER — INSULIN ASPART 100 UNIT/ML ~~LOC~~ SOLN: 0.0000 [IU] | Freq: Every day | SUBCUTANEOUS | Status: DC

## 2020-04-19 MED ORDER — LORAZEPAM 2 MG/ML IJ SOLN: 0.0000 mg | Freq: Two times a day (BID) | INTRAMUSCULAR | Status: DC

## 2020-04-19 MED ORDER — INSULIN ASPART 100 UNIT/ML ~~LOC~~ SOLN
0.0000 [IU] | Freq: Three times a day (TID) | SUBCUTANEOUS | Status: DC
  Administered 2020-04-19 – 2020-04-20 (×2): 2 [IU] via SUBCUTANEOUS
  Administered 2020-04-20: 3 [IU] via SUBCUTANEOUS
  Administered 2020-04-21: 5 [IU] via SUBCUTANEOUS
  Administered 2020-04-22 (×2): 3 [IU] via SUBCUTANEOUS
  Administered 2020-04-22: 2 [IU] via SUBCUTANEOUS

## 2020-04-19 MED ORDER — FENTANYL CITRATE (PF) 100 MCG/2ML IJ SOLN
100.0000 ug | Freq: Once | INTRAMUSCULAR | Status: AC
  Administered 2020-04-19: 06:00:00 100 ug via INTRAVENOUS
  Filled 2020-04-19: qty 2

## 2020-04-19 MED ORDER — MORPHINE SULFATE (PF) 2 MG/ML IV SOLN
2.0000 mg | INTRAVENOUS | Status: DC | PRN
  Administered 2020-04-19 – 2020-04-23 (×19): 2 mg via INTRAVENOUS
  Filled 2020-04-19 (×19): qty 1

## 2020-04-19 MED ORDER — ASPIRIN EC 81 MG PO TBEC
81.0000 mg | DELAYED_RELEASE_TABLET | Freq: Every day | ORAL | Status: DC
  Administered 2020-04-19 – 2020-04-22 (×4): 81 mg via ORAL
  Filled 2020-04-19 (×3): qty 1

## 2020-04-19 MED ORDER — TRAMADOL HCL 50 MG PO TABS
50.0000 mg | ORAL_TABLET | Freq: Two times a day (BID) | ORAL | Status: DC | PRN
  Administered 2020-04-19 – 2020-04-23 (×3): 50 mg via ORAL
  Filled 2020-04-19 (×4): qty 1

## 2020-04-19 NOTE — ED Notes (Signed)
Lunch Tray Ordered @ 1119. 

## 2020-04-19 NOTE — ED Provider Notes (Signed)
Patient became diaphoretic but has no new pain.  He reports the pain in his chest is the same and it is reproducible.  No abdominal tenderness.  He has not felt tremulous or that he is withdrawing from alcohol.  We will continue to monitor.  Repeat EKG is unchanged.  Awaiting hospitalist admission   EKG Interpretation  Date/Time:  Thursday April 19 2020 06:46:08 EDT Ventricular Rate:  66 PR Interval:    QRS Duration: 116 QT Interval:  464 QTC Calculation: 487 R Axis:   104 Text Interpretation: Atrial fibrillation Nonspecific intraventricular conduction delay Probable lateral infarct, age indeterminate Anteroseptal infarct, old No significant change since last tracing Confirmed by Ripley Fraise [phone]) on 04/19/2020 6:49:37 AM         Ripley Fraise, MD 04/19/20 [phone]

## 2020-04-19 NOTE — Progress Notes (Signed)
Patient received from the ED. Alert and oriented X4. Oriented to unit and procedures. VSS. No acute needs voiced at this time, call bell in reach.

## 2020-04-19 NOTE — Progress Notes (Signed)
Orthopedic Tech Progress Note Patient Details:  Brad [name] 1961/11/01 [id number] Level 2 trauma down grade Patient ID: Brad [name], male   DOB: 16-Mar-1961, 59 y.o.   MRN: [id number]   [name] 04/19/2020, 10:46 AM
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
## 2020-04-19 NOTE — ED Notes (Signed)
Dr. Wickline at bedside.  
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
## 2020-04-19 NOTE — Telephone Encounter (Signed)
PT called in to inform pcp that the patient was discharged due to PT not being appropriate due to the patient being unstable and his current medical conditions. Please follow up at your earliest convenience.

## 2020-04-19 NOTE — ED Notes (Signed)
Pt diaphoretic, CBG checked, new EKG captured and given to Dr. Christy Gentles.

## 2020-04-20 DIAGNOSIS — Z9119 Patient's noncompliance with other medical treatment and regimen: Secondary | ICD-10-CM | POA: Diagnosis not present

## 2020-04-20 DIAGNOSIS — E1140 Type 2 diabetes mellitus with diabetic neuropathy, unspecified: Secondary | ICD-10-CM | POA: Diagnosis present

## 2020-04-20 DIAGNOSIS — F17210 Nicotine dependence, cigarettes, uncomplicated: Secondary | ICD-10-CM | POA: Diagnosis present

## 2020-04-20 DIAGNOSIS — M1990 Unspecified osteoarthritis, unspecified site: Secondary | ICD-10-CM | POA: Diagnosis present

## 2020-04-20 DIAGNOSIS — E1159 Type 2 diabetes mellitus with other circulatory complications: Secondary | ICD-10-CM

## 2020-04-20 DIAGNOSIS — K7030 Alcoholic cirrhosis of liver without ascites: Secondary | ICD-10-CM | POA: Diagnosis present

## 2020-04-20 DIAGNOSIS — R296 Repeated falls: Secondary | ICD-10-CM | POA: Diagnosis present

## 2020-04-20 DIAGNOSIS — B182 Chronic viral hepatitis C: Secondary | ICD-10-CM | POA: Diagnosis present

## 2020-04-20 DIAGNOSIS — E1122 Type 2 diabetes mellitus with diabetic chronic kidney disease: Secondary | ICD-10-CM | POA: Diagnosis present

## 2020-04-20 DIAGNOSIS — I2510 Atherosclerotic heart disease of native coronary artery without angina pectoris: Secondary | ICD-10-CM | POA: Diagnosis present

## 2020-04-20 DIAGNOSIS — F1410 Cocaine abuse, uncomplicated: Secondary | ICD-10-CM | POA: Diagnosis present

## 2020-04-20 DIAGNOSIS — E871 Hypo-osmolality and hyponatremia: Principal | ICD-10-CM

## 2020-04-20 DIAGNOSIS — I482 Chronic atrial fibrillation, unspecified: Secondary | ICD-10-CM

## 2020-04-20 DIAGNOSIS — E785 Hyperlipidemia, unspecified: Secondary | ICD-10-CM | POA: Diagnosis present

## 2020-04-20 DIAGNOSIS — R197 Diarrhea, unspecified: Secondary | ICD-10-CM | POA: Diagnosis present

## 2020-04-20 DIAGNOSIS — R079 Chest pain, unspecified: Secondary | ICD-10-CM | POA: Diagnosis present

## 2020-04-20 DIAGNOSIS — I5042 Chronic combined systolic (congestive) and diastolic (congestive) heart failure: Secondary | ICD-10-CM

## 2020-04-20 DIAGNOSIS — I428 Other cardiomyopathies: Secondary | ICD-10-CM | POA: Diagnosis present

## 2020-04-20 DIAGNOSIS — Z20822 Contact with and (suspected) exposure to covid-19: Secondary | ICD-10-CM | POA: Diagnosis present

## 2020-04-20 DIAGNOSIS — R072 Precordial pain: Secondary | ICD-10-CM | POA: Diagnosis not present

## 2020-04-20 DIAGNOSIS — N1831 Chronic kidney disease, stage 3a: Secondary | ICD-10-CM | POA: Diagnosis present

## 2020-04-20 DIAGNOSIS — I4821 Permanent atrial fibrillation: Secondary | ICD-10-CM | POA: Diagnosis present

## 2020-04-20 DIAGNOSIS — Z794 Long term (current) use of insulin: Secondary | ICD-10-CM

## 2020-04-20 DIAGNOSIS — K219 Gastro-esophageal reflux disease without esophagitis: Secondary | ICD-10-CM | POA: Diagnosis present

## 2020-04-20 DIAGNOSIS — N179 Acute kidney failure, unspecified: Secondary | ICD-10-CM | POA: Diagnosis present

## 2020-04-20 DIAGNOSIS — R0789 Other chest pain: Secondary | ICD-10-CM | POA: Diagnosis not present

## 2020-04-20 DIAGNOSIS — F1020 Alcohol dependence, uncomplicated: Secondary | ICD-10-CM | POA: Diagnosis present

## 2020-04-20 DIAGNOSIS — I130 Hypertensive heart and chronic kidney disease with heart failure and stage 1 through stage 4 chronic kidney disease, or unspecified chronic kidney disease: Secondary | ICD-10-CM | POA: Diagnosis present

## 2020-04-20 DIAGNOSIS — G8929 Other chronic pain: Secondary | ICD-10-CM | POA: Diagnosis present

## 2020-04-20 LAB — TROPONIN I (HIGH SENSITIVITY): Troponin I (High Sensitivity): 36 ng/L — ABNORMAL HIGH (ref <18)

## 2020-04-20 LAB — CBC
HCT: 33.5 % — ABNORMAL LOW (ref 39.0–52.0)
Hemoglobin: 11.8 g/dL — ABNORMAL LOW (ref 13.0–17.0)
MCH: 29.9 pg (ref 26.0–34.0)
MCHC: 35.2 g/dL (ref 30.0–36.0)
MCV: 85 fL (ref 80.0–100.0)
Platelets: 245 10*3/uL (ref 150–400)
RBC: 3.94 MIL/uL — ABNORMAL LOW (ref 4.22–5.81)
RDW: 12.7 % (ref 11.5–15.5)
WBC: 2.9 10*3/uL — ABNORMAL LOW (ref 4.0–10.5)
nRBC: 0 % (ref 0.0–0.2)

## 2020-04-20 LAB — BRAIN NATRIURETIC PEPTIDE: B Natriuretic Peptide: 375.8 pg/mL — ABNORMAL HIGH (ref 0.0–100.0)

## 2020-04-20 LAB — GLUCOSE, CAPILLARY
Glucose-Capillary: 145 mg/dL — ABNORMAL HIGH (ref 70–99)
Glucose-Capillary: 123 mg/dL — ABNORMAL HIGH (ref 70–99)
Glucose-Capillary: 184 mg/dL — ABNORMAL HIGH (ref 70–99)
Glucose-Capillary: 174 mg/dL — ABNORMAL HIGH (ref 70–99)

## 2020-04-20 LAB — BASIC METABOLIC PANEL
Anion gap: 10 (ref 5–15)
Anion gap: 15 (ref 5–15)
Anion gap: 10 (ref 5–15)
BUN: 19 mg/dL (ref 6–20)
BUN: 20 mg/dL (ref 6–20)
BUN: 23 mg/dL — ABNORMAL HIGH (ref 6–20)
CO2: 22 mmol/L (ref 22–32)
CO2: 17 mmol/L — ABNORMAL LOW (ref 22–32)
CO2: 20 mmol/L — ABNORMAL LOW (ref 22–32)
Calcium: 8.6 mg/dL — ABNORMAL LOW (ref 8.9–10.3)
Calcium: 8.7 mg/dL — ABNORMAL LOW (ref 8.9–10.3)
Calcium: 8.6 mg/dL — ABNORMAL LOW (ref 8.9–10.3)
Chloride: 88 mmol/L — ABNORMAL LOW (ref 98–111)
Chloride: 88 mmol/L — ABNORMAL LOW (ref 98–111)
Chloride: 89 mmol/L — ABNORMAL LOW (ref 98–111)
Creatinine, Ser: 1.79 mg/dL — ABNORMAL HIGH (ref 0.61–1.24)
Creatinine, Ser: 1.96 mg/dL — ABNORMAL HIGH (ref 0.61–1.24)
Creatinine, Ser: 2.07 mg/dL — ABNORMAL HIGH (ref 0.61–1.24)
GFR calc Af Amer: 47 mL/min — ABNORMAL LOW (ref >60)
GFR calc Af Amer: 42 mL/min — ABNORMAL LOW (ref >60)
GFR calc Af Amer: 39 mL/min — ABNORMAL LOW (ref >60)
GFR calc non Af Amer: 40 mL/min — ABNORMAL LOW (ref >60)
GFR calc non Af Amer: 36 mL/min — ABNORMAL LOW (ref >60)
GFR calc non Af Amer: 34 mL/min — ABNORMAL LOW (ref >60)
Glucose, Bld: 146 mg/dL — ABNORMAL HIGH (ref 70–99)
Glucose, Bld: 154 mg/dL — ABNORMAL HIGH (ref 70–99)
Glucose, Bld: 149 mg/dL — ABNORMAL HIGH (ref 70–99)
Potassium: 4.2 mmol/L (ref 3.5–5.1)
Potassium: 4.3 mmol/L (ref 3.5–5.1)
Potassium: 4.4 mmol/L (ref 3.5–5.1)
Sodium: 120 mmol/L — ABNORMAL LOW (ref 135–145)
Sodium: 120 mmol/L — ABNORMAL LOW (ref 135–145)
Sodium: 119 mmol/L — CL (ref 135–145)

## 2020-04-20 LAB — NA AND K (SODIUM & POTASSIUM), RAND UR
Potassium Urine: 17 mmol/L
Sodium, Ur: <10 mmol/L

## 2020-04-20 LAB — OSMOLALITY, URINE: Osmolality, Ur: 232 mOsm/kg — ABNORMAL LOW (ref 300–900)

## 2020-04-20 MED ORDER — SODIUM CHLORIDE 0.9 % IV BOLUS
500.0000 mL | Freq: Once | INTRAVENOUS | Status: AC
  Administered 2020-04-20: 500 mL via INTRAVENOUS

## 2020-04-20 NOTE — TOC Initial Note (Signed)
Transition of Care [facility]) - Initial/Assessment Note    Patient Details  Name: Brad [name] MRN: [id number] Date of Birth: 08-Jun-1961  [facility]) CM/SW Contact:    Bethena Roys, RN Phone Number: 04/20/2020, 2:47 PM  Clinical Narrative:  Readmission risk assessment completed. Patient presented for chest pain. Prior to arrival patient was from home with roommate. Lives in an apartment and had home health services via [facility]. Call placed to agency to see if they can take the patient back. Agency will need to be called once stable to transition home. Patient has PCP [place]- at the [facility]. Case Manager reached out to clinic Case Manager [name] and she is aware of hospitalization. Case Manager received consult for [facility] and patient is not a candidate for [facility] 2/2 Medicaid. El[facility] HF Clinical Social worker previously assisted patient with outpatient substance abuse resources. She had referred him to an outpatient therapist for his substance abuse and talked to him about inpatient treatment; however patient was not ready for anything to be set up. TOC team to provide patient with outpatient substance abuse resources. Case Manager will continue to follow for additional transition of care needs.    Expected Discharge Plan: [place] Barriers to Discharge: Continued Medical Work up   Patient Goals and CMS Choice Patient states their goals for this hospitalization and ongoing recovery are:: to return home with roommate in apartment      Expected Discharge Plan and Services Expected Discharge Plan: [place] In-house Referral: NA Discharge Planning Services: CM Consult Post Acute Care Choice: [place], Resumption of Svcs/PTA Provider(reached out to [facility] to see if can accept back.) Living arrangements for the past 2 months: Apartment                 DME Arranged: N/A         HH Arranged: PT HH Agency: [facility](call placed to see if can return.)        Prior Living Arrangements/Services Living arrangements for the past 2 months: Apartment Lives with:: Roommate Patient language and need for interpreter reviewed:: Yes Do you feel safe going back to the place where you live?: Yes      Need for Family Participation in Patient Care: Yes (Comment) Care giver support system in place?: Yes (comment)   Criminal Activity/Legal Involvement Pertinent to Current Situation/Hospitalization: No - Comment as needed  Activities of Daily Living Home Assistive Devices/Equipment: Walker (specify type) ADL Screening (condition at time of admission) Patient's cognitive ability adequate to safely complete daily activities?: Yes Is the patient deaf or have difficulty hearing?: No Does the patient have difficulty seeing, even when wearing glasses/contacts?: No Does the patient have difficulty concentrating, remembering, or making decisions?: No Patient able to express need for assistance with ADLs?: Yes Does the patient have difficulty dressing or bathing?: No Independently performs ADLs?: Yes (appropriate for developmental age) Communication: Independent Dressing (OT): Independent Grooming: Independent Feeding: Independent Bathing: Independent Is this a change from baseline?: Pre-admission baseline Toileting: Independent Is this a change from baseline?: Pre-admission baseline Walks in Home: Needs assistance Is this a change from baseline?: Pre-admission baseline Does the patient have difficulty walking or climbing stairs?: Yes Weakness of Legs: Both Weakness of Arms/Hands: None  Permission Sought/Granted Permission sought to share information with : Facility Sport and exercise psychologist, Other (comment)(roommate) Permission granted to share information with : Yes, Verbal Permission Granted     Permission granted to share  info w AGENCY: [facility]        Emotional Assessment Appearance:: Appears stated  age Attitude/Demeanor/Rapport: Engaged Affect (typically observed): Appropriate Orientation: : Oriented to Situation, Oriented to  Time, Oriented to Place, Oriented to Self   Psych Involvement: No (comment)  Admission diagnosis:  Precordial pain [R07.2] Hyponatremia [E87.1] Chest pain [R07.9] Atrial fibrillation, unspecified type ([place]) [I48.91] Diarrhea, unspecified type [R19.7] Patient Active Problem List   Diagnosis Date Noted  . Chronic combined systolic and diastolic CHF (congestive heart failure) ([place]) 04/19/2020  . Alcohol abuse with intoxication ([place]) 03/23/2020  . Hyponatremia 03/23/2020  . Fall 03/23/2020  . Normocytic anemia 03/23/2020  . Leukopenia 03/23/2020  . HTN (hypertension) 03/23/2020  . HLD (hyperlipidemia) 03/23/2020  . AKI (acute kidney injury) ([place]) 03/23/2020  . CHF (congestive heart failure) ([place]) 03/23/2020  . Alcohol abuse with intoxication ([place]) 03/05/2020  . Chronic systolic CHF (congestive heart failure) ([place]) 10/31/2019  . Acute systolic CHF (congestive heart failure) ([place]) 08/02/2019  . Diarrhea 07/29/2019  . Gastroenteritis 07/22/2019  . Hypomagnesemia 06/19/2019  . Chest pain 06/07/2019  . Elevated troponin 05/23/2019  . Tobacco abuse 05/23/2019  . Alcohol abuse 02/21/2019  . Frequent falls 01/17/2019  . Severe mitral regurgitation   . Fall 11/01/2018  . Hyponatremia 10/31/2018  . Chronic atrial fibrillation   . Chronic hyponatremia 08/16/2018  . NSVT (nonsustained ventricular tachycardia) ([place])   . Concussion with loss of consciousness   . Scalp laceration   . Trauma   . Permanent atrial fibrillation   . Hypertensive heart disease   . Shortness of breath   . Pyogenic inflammation of bone ([place])   . Cirrhosis ([place]) 03/23/2018  . Right ankle pain 03/23/2018  . Syncope 08/07/2017  . Acute on chronic combined systolic and diastolic CHF (congestive heart failure) ([place]) 07/09/2017  . Atrial flutter ([place]) 07/09/2017  . Pre-syncope  07/08/2017  . Neuropathy 05/08/2017  . Substance induced mood disorder ([place]) 10/06/2016  . CVA (cerebral vascular accident) ([place]) 09/18/2016  . Left sided numbness   . Homelessness 08/21/2016  . S/P ORIF (open reduction internal fixation) fracture 08/01/2016  . CAD (coronary artery disease), native coronary artery 07/30/2016  . Surgery, elective   . Insomnia 07/22/2016  . NSTEMI (non-ST elevated myocardial infarction) ([place])   . Anemia 07/05/2016  . Thrombocytopenia ([place]) 07/05/2016  . Cocaine abuse ([place]) 07/02/2016  . Chest pain on breathing 07/01/2016  . Essential hypertension 07/01/2016  . DM type 2 (diabetes mellitus, type 2) ([place]) 07/01/2016  . Hypokalemia 07/01/2016  . CKD (chronic kidney disease) stage 3, GFR 30-59 ml/min 07/01/2016  . Painful diabetic neuropathy ([place]) 07/01/2016  . Acute hyponatremia 05/27/2016  . Polysubstance abuse ([place]) 05/27/2016  . Chronic hepatitis C with cirrhosis ([place]) 05/27/2016  . Depression 04/21/2012  . GERD (gastroesophageal reflux disease) 02/16/2012  . History of drug abuse ([place])   . Heroin addiction ([place]) 01/29/2012   PCP:  Charlott Rakes, MD Pharmacy:   [place], [place] - [address] [place] [address] Phone: [phone] Fax: [phone]     Social Determinants of Health (SDOH) Interventions    Readmission Risk Interventions Readmission Risk Prevention Plan 04/20/2020 03/26/2020 03/07/2020  Transportation Screening Complete Complete Complete  PCP or Specialist Appt within 5-7 Days - Complete -  Home Care Screening - Complete -  Medication Review (RN CM) - Complete -  Medication Review (RN Care Manager) Complete - Complete  PCP or Specialist appointment within 3-5 days  of discharge - - Complete  PCP/Specialist Appt Not Complete comments - - -  HRI or Home Care Consult Complete - Complete  HRI or Home Care Consult Pt Refusal Comments - - -  SW Recovery Care/Counseling  Consult Complete - Complete  Palliative Care Screening Not Applicable - Not Applicable  Skilled Nursing Facility Not Applicable - Not Applicable  Some recent data might be hidden

## 2020-04-20 NOTE — Progress Notes (Signed)
Outpatient CSW follows [facility] through [facility] and was informed of current admission.  CSW met with pt at bedside to check in and further discuss substance abuse concerns.  Pt reports he only had one beer prior to admission and was proud that he did not have more than that.  CSW was able to send in referral to [place] for outpatient substance abuse counseling today and am awaiting response to the referral- pt informed of pending referral.  CSW discussed it pt has changed his mind regarding inpatient rehab as he was not interested last time we spoke.  Pt continues to be unsure and does not want to move forward with anything at this time- wants to see how he feels when he discharges and agreeable to CSW follow up to assist if he decides he needs inpatient.  Overall pt reports feeling much better and appears to be a lot more alert in hospital than he did during clinic visit this week.  CSW will continue to follow pt in the outpatient setting and assist as needed  [name], [facility] Desk#: [phone] Cell#: [phone]

## 2020-04-20 NOTE — Progress Notes (Signed)
PROGRESS NOTE    Brad [name]  [id number] DOB: 07-Nov-1961 DOA: 04/18/2020 PCP: Brad Rakes, MD   Brief Narrative:  59 year old with history of EtOH use, A. fib, CHF, HTN, multiple admissions presents with chest discomfort and diarrhea.  Found to have sodium of 119.   Assessment & Plan:   Principal Problem:   Chest pain Active Problems:   Acute hyponatremia   Essential hypertension   DM type 2 (diabetes mellitus, type 2) (HCC)   CKD (chronic kidney disease) stage 3, GFR 30-59 ml/min   Cocaine abuse (HCC)   Permanent atrial fibrillation   Chronic atrial fibrillation   Tobacco abuse   Diarrhea   HLD (hyperlipidemia)   Chronic combined systolic and diastolic CHF (congestive heart failure) (HCC)  Acute on Chronic Hyponatremia -Concerning for beer portal mania.  Urine electrolytes ordered -Currently on fluid restriction.  Closely monitor his sodium levels.  Currently it is 121.  Baseline at 728.  Chest pain -Atypical, noncardiac in nature.  Troponins remain flat, EKG is unremarkable.  Chest pain is reproducible.  Essential hypertension -As needed hydralazine -BiDil, Aldactone  Hyperlipidemia -Continue atorvastatin 20 mg daily  Diarrhea -Resolved  Congestive heart failure with reduced ejection fraction, 35%. -Appears to be compensated.  Continue Aldactone, holding Demadex.  Chronic afib -Not on anticoagulation due to recurrent falls.  ETOH dependence with cirrhosis Hepatitis C -Counseled to quit using alcohol but he continues to use it.  Alcohol withdrawal protocol.  Stage 3a CKD -Creatinine around baseline of 1.5  Diabetes mellitus type 2 -A1c 6.8 on 3/4 -Insulin sliding scale Accu-Cheks  Tobacco dependence -Counseled to quit using this  Unfortunately patient remains extremely noncompliant with all the instructions provided to him.  Continues to have recurrent hospital admissions for similar reasons  DVT prophylaxis: Lovenox Code  Status: Full code Family Communication: None at bedside  Status is: Inpatient  Remains inpatient appropriate because:Persistent severe electrolyte disturbances   Dispo: The patient is from: Home              Anticipated d/c is to: Home              Anticipated d/c date is: 2 days              Patient currently medically stable but has significant electrolyte abnormality therefore unsafe for discharge.   Subjective: Feels okay denies any complaints.  He tells me he has quit drinking at least 3 weeks ago but continues to smoke cigarettes.  Poor appetite over last few days.  Review of Systems Otherwise negative except as per HPI, including: General: Denies fever, chills, night sweats or unintended weight loss. Resp: Denies cough, wheezing, shortness of breath. Cardiac: Denies chest pain, palpitations, orthopnea, paroxysmal nocturnal dyspnea. GI: Denies abdominal pain, nausea, vomiting, diarrhea or constipation GU: Denies dysuria, frequency, hesitancy or incontinence MS: Denies muscle aches, joint pain or swelling Neuro: Denies headache, neurologic deficits (focal weakness, numbness, tingling), abnormal gait Psych: Denies anxiety, depression, SI/HI/AVH Skin: Denies new rashes or lesions ID: Denies sick contacts, exotic exposures, travel  Examination:  General exam: Appears calm and comfortable  Respiratory system: Clear to auscultation. Respiratory effort normal. Cardiovascular system: S1 & S2 heard, RRR. No JVD, murmurs, rubs, gallops or clicks. No pedal edema. Gastrointestinal system: Abdomen is nondistended, soft and nontender. No organomegaly or masses felt. Normal bowel sounds heard. Central nervous system: Alert and oriented. No focal neurological deficits. Extremities: Symmetric 5 x 5 power. Skin: No rashes, lesions or ulcers Psychiatry: Judgement and insight  appear normal. Mood & affect appropriate.     Objective: Vitals:   04/19/20 2100 04/19/20 2147 04/19/20 2346  04/20/20 0708  BP:   117/74 117/81  Pulse: 68  66 64  Resp:      Temp:   (!) 97.4 F (36.3 C) 97.7 F (36.5 C)  TempSrc:   Oral Oral  SpO2:  99% 100% 100%  Weight:      Height:        Intake/Output Summary (Last 24 hours) at 04/20/2020 0837 Last data filed at 04/19/2020 2200 Gross per 24 hour  Intake 600 ml  Output 200 ml  Net 400 ml   Filed Weights   04/18/20 2254  Weight: 77.1 kg     Data Reviewed:   CBC: Recent Labs  Lab 04/18/20 2257 04/20/20 0200  WBC 2.7* 2.9*  HGB 13.3 11.8*  HCT 38.0* 33.5*  MCV 86.4 85.0  PLT 307 [id number]   Basic Metabolic Panel: Recent Labs  Lab 04/16/20 1138 04/18/20 2257 04/19/20 1220 04/19/20 1751 04/20/20 0200  NA 123* 119* 121* 121* 120*  K 4.7 3.9 4.1 4.1 4.2  CL 89* 86* 89* 88* 88*  CO2 16* 22 23 23 22   GLUCOSE 118* 138* 140* 132* 146*  BUN 18 13 14 16 19   CREATININE 1.32* 1.40* 1.43* 1.60* 1.79*  CALCIUM 9.3 8.9 9.0 9.2 8.6*   GFR: Estimated Creatinine Clearance: 46.7 mL/min (A) (by C-G formula based on SCr of 1.79 mg/dL (H)). Liver Function Tests: No results for input(s): AST, ALT, ALKPHOS, BILITOT, PROT, ALBUMIN in the last 168 hours. No results for input(s): LIPASE, AMYLASE in the last 168 hours. Recent Labs  Lab 04/16/20 1138  AMMONIA 131   Coagulation Profile: No results for input(s): INR, PROTIME in the last 168 hours. Cardiac Enzymes: No results for input(s): CKTOTAL, CKMB, CKMBINDEX, TROPONINI in the last 168 hours. BNP (last 3 results) No results for input(s): PROBNP in the last 8760 hours. HbA1C: No results for input(s): HGBA1C in the last 72 hours. CBG: Recent Labs  Lab 04/19/20 0645 04/19/20 1159 04/19/20 1709 04/19/20 2200 04/20/20 0742  GLUCAP 139* 131* 149* 137* 145*   Lipid Profile: Recent Labs    04/19/20 0134  CHOL 206*  HDL 104  LDLCALC 86  TRIG 78  CHOLHDL 2.0   Thyroid Function Tests: No results for input(s): TSH, T4TOTAL, FREET4, T3FREE, THYROIDAB in the last 72  hours. Anemia Panel: No results for input(s): VITAMINB12, FOLATE, FERRITIN, TIBC, IRON, RETICCTPCT in the last 72 hours. Sepsis Labs: No results for input(s): PROCALCITON, LATICACIDVEN in the last 168 hours.  Recent Results (from the past 240 hour(s))  SARS CORONAVIRUS 2 (TAT 6-24 HRS) Nasopharyngeal Nasopharyngeal Swab     Status: None   Collection Time: 04/19/20  6:27 AM   Specimen: Nasopharyngeal Swab  Result Value Ref Range Status   SARS Coronavirus 2 NEGATIVE NEGATIVE Final    Comment: (NOTE) SARS-CoV-2 target nucleic acids are NOT DETECTED. The SARS-CoV-2 RNA is generally detectable in upper and lower respiratory specimens during the acute phase of infection. Negative results do not preclude SARS-CoV-2 infection, do not rule out co-infections with other pathogens, and should not be used as the sole basis for treatment or other patient management decisions. Negative results must be combined with clinical observations, patient history, and epidemiological information. The expected result is Negative. Fact Sheet for Patients: SugarRoll.be Fact Sheet for Healthcare Providers: https://www.woods-mathews.com/ This test is not yet approved or cleared by the [place] FDA and  has been authorized for detection and/or diagnosis of SARS-CoV-2 by FDA under an Emergency Use Authorization (EUA). This EUA will remain  in effect (meaning this test can be used) for the duration of the COVID-19 declaration under Section 56 4(b)(1) of the Act, 21 U.S.C. section 360bbb-3(b)(1), unless the authorization is terminated or revoked sooner. Performed at [facility], [place] [address], [place], [place] [address]          Radiology Studies: DG Chest 2 View  Result Date: 04/18/2020 CLINICAL DATA:  Chest pain EXAM: CHEST - 2 VIEW COMPARISON:  04/02/2020 FINDINGS: Cardiac shadow is mildly enlarged but stable. Aortic calcifications again seen. Lungs  are well aerated bilaterally. No focal infiltrate or sizable effusion is seen. No bony abnormality is noted. IMPRESSION: No active cardiopulmonary disease. Electronically Signed   By: Inez Catalina M.D.   On: 04/18/2020 23:10        Scheduled Meds: . aspirin EC  81 mg Oral Daily  . atorvastatin  20 mg Oral q1800  . enoxaparin (LOVENOX) injection  40 mg Subcutaneous Q24H  . folic acid  1 mg Oral Daily  . gabapentin  600 mg Oral QHS  . insulin aspart  0-15 Units Subcutaneous TID WC  . insulin aspart  0-5 Units Subcutaneous QHS  . isosorbide-hydrALAZINE  2 tablet Oral TID  . multivitamin with minerals  1 tablet Oral Daily  . spironolactone  25 mg Oral Daily  . thiamine  100 mg Oral Daily   Or  . thiamine  100 mg Intravenous Daily   Continuous Infusions:   LOS: 0 days   Time spent= 35 mins    Elizer Bostic Arsenio Loader, MD [facility]  If 7PM-7AM, please contact night-coverage  04/20/2020, 8:37 AM

## 2020-04-21 DIAGNOSIS — R0789 Other chest pain: Secondary | ICD-10-CM

## 2020-04-21 LAB — GLUCOSE, CAPILLARY
Glucose-Capillary: 133 mg/dL — ABNORMAL HIGH (ref 70–99)
Glucose-Capillary: 243 mg/dL — ABNORMAL HIGH (ref 70–99)
Glucose-Capillary: 178 mg/dL — ABNORMAL HIGH (ref 70–99)
Glucose-Capillary: 134 mg/dL — ABNORMAL HIGH (ref 70–99)

## 2020-04-21 LAB — BASIC METABOLIC PANEL
Anion gap: 10 (ref 5–15)
Anion gap: 9 (ref 5–15)
Anion gap: 13 (ref 5–15)
BUN: 24 mg/dL — ABNORMAL HIGH (ref 6–20)
BUN: 23 mg/dL — ABNORMAL HIGH (ref 6–20)
BUN: 26 mg/dL — ABNORMAL HIGH (ref 6–20)
CO2: 20 mmol/L — ABNORMAL LOW (ref 22–32)
CO2: 22 mmol/L (ref 22–32)
CO2: 20 mmol/L — ABNORMAL LOW (ref 22–32)
Calcium: 8.8 mg/dL — ABNORMAL LOW (ref 8.9–10.3)
Calcium: 8.9 mg/dL (ref 8.9–10.3)
Calcium: 8.9 mg/dL (ref 8.9–10.3)
Chloride: 90 mmol/L — ABNORMAL LOW (ref 98–111)
Chloride: 92 mmol/L — ABNORMAL LOW (ref 98–111)
Chloride: 93 mmol/L — ABNORMAL LOW (ref 98–111)
Creatinine, Ser: 2.11 mg/dL — ABNORMAL HIGH (ref 0.61–1.24)
Creatinine, Ser: 1.89 mg/dL — ABNORMAL HIGH (ref 0.61–1.24)
Creatinine, Ser: 1.86 mg/dL — ABNORMAL HIGH (ref 0.61–1.24)
GFR calc Af Amer: 38 mL/min — ABNORMAL LOW (ref >60)
GFR calc Af Amer: 44 mL/min — ABNORMAL LOW (ref >60)
GFR calc Af Amer: 45 mL/min — ABNORMAL LOW (ref >60)
GFR calc non Af Amer: 33 mL/min — ABNORMAL LOW (ref >60)
GFR calc non Af Amer: 38 mL/min — ABNORMAL LOW (ref >60)
GFR calc non Af Amer: 38 mL/min — ABNORMAL LOW (ref >60)
Glucose, Bld: 207 mg/dL — ABNORMAL HIGH (ref 70–99)
Glucose, Bld: 127 mg/dL — ABNORMAL HIGH (ref 70–99)
Glucose, Bld: 134 mg/dL — ABNORMAL HIGH (ref 70–99)
Potassium: 4.6 mmol/L (ref 3.5–5.1)
Potassium: 4.5 mmol/L (ref 3.5–5.1)
Potassium: 4.5 mmol/L (ref 3.5–5.1)
Sodium: 120 mmol/L — ABNORMAL LOW (ref 135–145)
Sodium: 123 mmol/L — ABNORMAL LOW (ref 135–145)
Sodium: 126 mmol/L — ABNORMAL LOW (ref 135–145)

## 2020-04-21 LAB — CBC
HCT: 33.8 % — ABNORMAL LOW (ref 39.0–52.0)
Hemoglobin: 11.6 g/dL — ABNORMAL LOW (ref 13.0–17.0)
MCH: 30.1 pg (ref 26.0–34.0)
MCHC: 34.3 g/dL (ref 30.0–36.0)
MCV: 87.8 fL (ref 80.0–100.0)
Platelets: 268 10*3/uL (ref 150–400)
RBC: 3.85 MIL/uL — ABNORMAL LOW (ref 4.22–5.81)
RDW: 13.2 % (ref 11.5–15.5)
WBC: 3.3 10*3/uL — ABNORMAL LOW (ref 4.0–10.5)
nRBC: 0 % (ref 0.0–0.2)

## 2020-04-21 LAB — MAGNESIUM: Magnesium: 1.8 mg/dL (ref 1.7–2.4)

## 2020-04-21 MED ORDER — SODIUM CHLORIDE 1 G PO TABS
1.0000 g | ORAL_TABLET | Freq: Three times a day (TID) | ORAL | Status: AC
  Administered 2020-04-21 (×3): 1 g via ORAL
  Filled 2020-04-21 (×4): qty 1

## 2020-04-21 MED ORDER — SODIUM CHLORIDE 0.9 % IV SOLN: INTRAVENOUS | Status: AC

## 2020-04-21 NOTE — Progress Notes (Signed)
PROGRESS NOTE    Brad [name]  [id number] DOB: [birth year] DOA: 04/18/2020 PCP: Charlott Rakes, MD   Brief Narrative:  59 year old with history of EtOH use, A. fib, CHF, HTN, multiple admissions presents with chest discomfort and diarrhea.  Found to have sodium of 119.  There was concerns of intravascular depletion therefore started on IV fluids and salt tablets.   Assessment & Plan:   Principal Problem:   Chest pain Active Problems:   Acute hyponatremia   Essential hypertension   DM type 2 (diabetes mellitus, type 2) (HCC)   CKD (chronic kidney disease) stage 3, GFR 30-59 ml/min   Cocaine abuse (HCC)   Permanent atrial fibrillation   Chronic atrial fibrillation   Tobacco abuse   Diarrhea   Hyponatremia   HLD (hyperlipidemia)   Chronic combined systolic and diastolic CHF (congestive heart failure) (HCC)  Acute on Chronic Hyponatremia -Suspect from intravascular depletion.  Urine electrolytes show sodium less than 10. -We will order salt tablets for 3 doses, normal saline IV.  Continue to monitor sodium levels closely.  Chest pain -Atypical, noncardiac in nature.  Troponins remain flat, EKG is unremarkable.  Chest pain is reproducible.  Essential hypertension -As needed hydralazine -Continue BiDil.  Aldactone on hold for now.  Hyperlipidemia -Continue atorvastatin 20 mg daily  Diarrhea -Resolved  Congestive heart failure with reduced ejection fraction, 35%. -Appears to be compensated.  Continue Aldactone, holding Demadex.  Chronic afib -Not on anticoagulation due to recurrent falls.  ETOH dependence with cirrhosis Hepatitis C -Counseled to quit using alcohol but he continues to use it.  Alcohol withdrawal protocol.  Stage 3a CKD -Creatinine around baseline of 1.5  Diabetes mellitus type 2 -A1c 6.8 on 3/4 -Insulin sliding scale Accu-Cheks  Tobacco dependence -Counseled to quit using this  Unfortunately patient remains extremely  noncompliant with all the instructions provided to him.  Continues to have recurrent hospital admissions for similar reasons  DVT prophylaxis: Lovenox Code Status: Full code Family Communication: None at bedside  Status is: Inpatient  Remains inpatient appropriate because:Persistent severe electrolyte disturbances.  Sodium levels still not adequately improved therefore maintain hospital stay for IV fluids.   Dispo: The patient is from: Home              Anticipated d/c is to: Home              Anticipated d/c date is: 2 days              Patient currently medically stable but has significant electrolyte abnormality therefore unsafe for discharge.   Subjective: Feels okay no complaints.  Review of Systems Otherwise negative except as per HPI, including: General: Denies fever, chills, night sweats or unintended weight loss. Resp: Denies cough, wheezing, shortness of breath. Cardiac: Denies chest pain, palpitations, orthopnea, paroxysmal nocturnal dyspnea. GI: Denies abdominal pain, nausea, vomiting, diarrhea or constipation GU: Denies dysuria, frequency, hesitancy or incontinence MS: Denies muscle aches, joint pain or swelling Neuro: Denies headache, neurologic deficits (focal weakness, numbness, tingling), abnormal gait Psych: Denies anxiety, depression, SI/HI/AVH Skin: Denies new rashes or lesions ID: Denies sick contacts, exotic exposures, travel Examination:  Constitutional: Not in acute distress Respiratory: Clear to auscultation bilaterally Cardiovascular: Normal sinus rhythm, no rubs Abdomen: Nontender nondistended good bowel sounds Musculoskeletal: No edema noted Skin: No rashes seen Neurologic: CN 2-12 grossly intact.  And nonfocal Psychiatric: Normal judgment and insight. Alert and oriented x 3. Normal mood.  Objective: Vitals:   04/20/20 0708 04/20/20 1300 04/20/20 1948 04/21/20  0625  BP: 117/81 109/64 121/71 (!) 129/91  Pulse: 64  60 69  Resp:   15 18  Temp:  97.7 F (36.5 C) 97.8 F (36.6 C) 98 F (36.7 C) 98.2 F (36.8 C)  TempSrc: Oral Oral Oral Oral  SpO2: 100%  100% 99%  Weight:      Height:        Intake/Output Summary (Last 24 hours) at 04/21/2020 1020 Last data filed at 04/20/2020 1300 Gross per 24 hour  Intake 111 ml  Output --  Net 111 ml   Filed Weights   04/18/20 2254  Weight: 77.1 kg     Data Reviewed:   CBC: Recent Labs  Lab 04/18/20 2257 04/20/20 0200 04/21/20 0211  WBC 2.7* 2.9* 3.3*  HGB 13.3 11.8* 11.6*  HCT 38.0* 33.5* 33.8*  MCV 86.4 85.0 87.8  PLT 307 245 [id number]   Basic Metabolic Panel: Recent Labs  Lab 04/20/20 0200 04/20/20 0904 04/20/20 1701 04/21/20 0211 04/21/20 0737  NA 120* 120* 119* 120* 123*  K 4.2 4.3 4.4 4.6 4.5  CL 88* 88* 89* 90* 92*  CO2 22 17* 20* 20* 22  GLUCOSE 146* 154* 149* 207* 127*  BUN 19 20 23* 24* 23*  CREATININE 1.79* 1.96* 2.07* 2.11* 1.89*  CALCIUM 8.6* 8.7* 8.6* 8.8* 8.9  MG  --   --   --  1.8  --    GFR: Estimated Creatinine Clearance: 44.3 mL/min (A) (by C-G formula based on SCr of 1.89 mg/dL (H)). Liver Function Tests: No results for input(s): AST, ALT, ALKPHOS, BILITOT, PROT, ALBUMIN in the last 168 hours. No results for input(s): LIPASE, AMYLASE in the last 168 hours. Recent Labs  Lab 04/16/20 1138  AMMONIA 131   Coagulation Profile: No results for input(s): INR, PROTIME in the last 168 hours. Cardiac Enzymes: No results for input(s): CKTOTAL, CKMB, CKMBINDEX, TROPONINI in the last 168 hours. BNP (last 3 results) No results for input(s): PROBNP in the last 8760 hours. HbA1C: No results for input(s): HGBA1C in the last 72 hours. CBG: Recent Labs  Lab 04/20/20 0742 04/20/20 1102 04/20/20 1548 04/20/20 2201 04/21/20 0731  GLUCAP 145* 123* 184* 174* 133*   Lipid Profile: Recent Labs    04/19/20 0134  CHOL 206*  HDL 104  LDLCALC 86  TRIG 78  CHOLHDL 2.0   Thyroid Function Tests: No results for input(s): TSH, T4TOTAL, FREET4, T3FREE,  THYROIDAB in the last 72 hours. Anemia Panel: No results for input(s): VITAMINB12, FOLATE, FERRITIN, TIBC, IRON, RETICCTPCT in the last 72 hours. Sepsis Labs: No results for input(s): PROCALCITON, LATICACIDVEN in the last 168 hours.  Recent Results (from the past 240 hour(s))  SARS CORONAVIRUS 2 (TAT 6-24 HRS) Nasopharyngeal Nasopharyngeal Swab     Status: None   Collection Time: 04/19/20  6:27 AM   Specimen: Nasopharyngeal Swab  Result Value Ref Range Status   SARS Coronavirus 2 NEGATIVE NEGATIVE Final    Comment: (NOTE) SARS-CoV-2 target nucleic acids are NOT DETECTED. The SARS-CoV-2 RNA is generally detectable in upper and lower respiratory specimens during the acute phase of infection. Negative results do not preclude SARS-CoV-2 infection, do not rule out co-infections with other pathogens, and should not be used as the sole basis for treatment or other patient management decisions. Negative results must be combined with clinical observations, patient history, and epidemiological information. The expected result is Negative. Fact Sheet for Patients: SugarRoll.be Fact Sheet for Healthcare Providers: https://www.woods-mathews.com/ This test is not yet approved or cleared  by the [place] and  has been authorized for detection and/or diagnosis of SARS-CoV-2 by FDA under an Emergency Use Authorization (EUA). This EUA will remain  in effect (meaning this test can be used) for the duration of the COVID-19 declaration under Section 56 4(b)(1) of the Act, 21 U.S.C. section 360bbb-3(b)(1), unless the authorization is terminated or revoked sooner. Performed at [facility], [place] [address], [place], [place] [address]          Radiology Studies: No results found.      Scheduled Meds: . aspirin EC  81 mg Oral Daily  . atorvastatin  20 mg Oral q1800  . enoxaparin (LOVENOX) injection  40 mg Subcutaneous Q24H  . folic acid   1 mg Oral Daily  . gabapentin  600 mg Oral QHS  . insulin aspart  0-15 Units Subcutaneous TID WC  . insulin aspart  0-5 Units Subcutaneous QHS  . isosorbide-hydrALAZINE  2 tablet Oral TID  . multivitamin with minerals  1 tablet Oral Daily  . sodium chloride  1 g Oral TID WC  . thiamine  100 mg Oral Daily   Or  . thiamine  100 mg Intravenous Daily   Continuous Infusions: . sodium chloride 75 mL/hr at 04/21/20 0924     LOS: 1 day   Time spent= 35 mins    Amyah Clawson Arsenio Loader, MD [facility]  If 7PM-7AM, please contact night-coverage  04/21/2020, 10:20 AM

## 2020-04-22 DIAGNOSIS — N1831 Chronic kidney disease, stage 3a: Secondary | ICD-10-CM

## 2020-04-22 LAB — GLUCOSE, CAPILLARY
Glucose-Capillary: 167 mg/dL — ABNORMAL HIGH (ref 70–99)
Glucose-Capillary: 127 mg/dL — ABNORMAL HIGH (ref 70–99)
Glucose-Capillary: 157 mg/dL — ABNORMAL HIGH (ref 70–99)
Glucose-Capillary: 139 mg/dL — ABNORMAL HIGH (ref 70–99)

## 2020-04-22 LAB — CBC
HCT: 30.3 % — ABNORMAL LOW (ref 39.0–52.0)
Hemoglobin: 10.3 g/dL — ABNORMAL LOW (ref 13.0–17.0)
MCH: 30.2 pg (ref 26.0–34.0)
MCHC: 34 g/dL (ref 30.0–36.0)
MCV: 88.9 fL (ref 80.0–100.0)
Platelets: 221 10*3/uL (ref 150–400)
RBC: 3.41 MIL/uL — ABNORMAL LOW (ref 4.22–5.81)
RDW: 13.4 % (ref 11.5–15.5)
WBC: 3.5 10*3/uL — ABNORMAL LOW (ref 4.0–10.5)
nRBC: 0 % (ref 0.0–0.2)

## 2020-04-22 LAB — BASIC METABOLIC PANEL
Anion gap: 8 (ref 5–15)
Anion gap: 10 (ref 5–15)
Anion gap: 7 (ref 5–15)
BUN: 28 mg/dL — ABNORMAL HIGH (ref 6–20)
BUN: 24 mg/dL — ABNORMAL HIGH (ref 6–20)
BUN: 22 mg/dL — ABNORMAL HIGH (ref 6–20)
CO2: 21 mmol/L — ABNORMAL LOW (ref 22–32)
CO2: 20 mmol/L — ABNORMAL LOW (ref 22–32)
CO2: 21 mmol/L — ABNORMAL LOW (ref 22–32)
Calcium: 8.6 mg/dL — ABNORMAL LOW (ref 8.9–10.3)
Calcium: 9 mg/dL (ref 8.9–10.3)
Calcium: 8.8 mg/dL — ABNORMAL LOW (ref 8.9–10.3)
Chloride: 97 mmol/L — ABNORMAL LOW (ref 98–111)
Chloride: 98 mmol/L (ref 98–111)
Chloride: 102 mmol/L (ref 98–111)
Creatinine, Ser: 1.84 mg/dL — ABNORMAL HIGH (ref 0.61–1.24)
Creatinine, Ser: 1.63 mg/dL — ABNORMAL HIGH (ref 0.61–1.24)
Creatinine, Ser: 1.6 mg/dL — ABNORMAL HIGH (ref 0.61–1.24)
GFR calc Af Amer: 45 mL/min — ABNORMAL LOW (ref >60)
GFR calc Af Amer: 52 mL/min — ABNORMAL LOW (ref >60)
GFR calc Af Amer: 53 mL/min — ABNORMAL LOW (ref >60)
GFR calc non Af Amer: 39 mL/min — ABNORMAL LOW (ref >60)
GFR calc non Af Amer: 45 mL/min — ABNORMAL LOW (ref >60)
GFR calc non Af Amer: 46 mL/min — ABNORMAL LOW (ref >60)
Glucose, Bld: 141 mg/dL — ABNORMAL HIGH (ref 70–99)
Glucose, Bld: 177 mg/dL — ABNORMAL HIGH (ref 70–99)
Glucose, Bld: 122 mg/dL — ABNORMAL HIGH (ref 70–99)
Potassium: 4.7 mmol/L (ref 3.5–5.1)
Potassium: 4.8 mmol/L (ref 3.5–5.1)
Potassium: 4.6 mmol/L (ref 3.5–5.1)
Sodium: 126 mmol/L — ABNORMAL LOW (ref 135–145)
Sodium: 128 mmol/L — ABNORMAL LOW (ref 135–145)
Sodium: 130 mmol/L — ABNORMAL LOW (ref 135–145)

## 2020-04-22 LAB — MAGNESIUM: Magnesium: 1.9 mg/dL (ref 1.7–2.4)

## 2020-04-22 MED ORDER — SODIUM CHLORIDE 1 G PO TABS
1.0000 g | ORAL_TABLET | Freq: Four times a day (QID) | ORAL | Status: AC
  Administered 2020-04-22 – 2020-04-23 (×3): 1 g via ORAL
  Filled 2020-04-22 (×3): qty 1

## 2020-04-22 MED ORDER — SODIUM CHLORIDE 0.9 % IV SOLN: INTRAVENOUS | Status: DC

## 2020-04-22 NOTE — Progress Notes (Signed)
PROGRESS NOTE    Brad [name]  [id number] DOB: 1961-03-23 DOA: 04/18/2020 PCP: Charlott Rakes, MD   Brief Narrative:  59 year old with history of EtOH use, A. fib, CHF, HTN, multiple admissions presents with chest discomfort and diarrhea.  Found to have sodium of 119.  There was concerns of intravascular depletion therefore started on IV fluids and salt tablets.   Assessment & Plan:   Principal Problem:   Chest pain Active Problems:   Acute hyponatremia   Essential hypertension   DM type 2 (diabetes mellitus, type 2) (HCC)   CKD (chronic kidney disease) stage 3, GFR 30-59 ml/min   Cocaine abuse (HCC)   Permanent atrial fibrillation   Chronic atrial fibrillation   Tobacco abuse   Diarrhea   Hyponatremia   HLD (hyperlipidemia)   Chronic combined systolic and diastolic CHF (congestive heart failure) (HCC)  Acute on Chronic Hyponatremia -Suspect intravascular volume depletion.  Slowly continues to improve.  Continue 1 more day of normal saline and sodium chloride tablet.  Chest pain -Atypical, noncardiac in nature.  Troponins remain flat, EKG is unremarkable.  Chest pain is reproducible.  Essential hypertension -As needed hydralazine -Continue BiDil.  Aldactone on hold for now.  Hyperlipidemia -Continue atorvastatin 20 mg daily  Diarrhea -Resolved  Congestive heart failure with reduced ejection fraction, 35%. -Appears to be compensated.  Continue Aldactone, holding Demadex.  Chronic afib -Not on anticoagulation due to recurrent falls.  ETOH dependence with cirrhosis Hepatitis C -Counseled to quit using alcohol but he continues to use it.  Alcohol withdrawal protocol.  Stage 3a CKD -Creatinine around baseline of 1.5.  Creatinine today 1.6  Diabetes mellitus type 2 -A1c 6.8 on 3/4 -Insulin sliding scale Accu-Cheks  Tobacco dependence -Counseled to quit using this  Unfortunately patient remains extremely noncompliant with all the  instructions provided to him.  Continues to have recurrent hospital admissions for similar reasons  DVT prophylaxis: Lovenox Code Status: Full code Family Communication: None at bedside  Status is: Inpatient  Remains inpatient appropriate because:Persistent severe electrolyte disturbances.  Sodium levels still not adequately improved therefore maintain hospital stay for IV fluids.   Dispo: The patient is from: Home              Anticipated d/c is to: Home              Anticipated d/c date is: Anticipate discharge tomorrow              Patient currently medically stable but has significant electrolyte abnormality therefore unsafe for discharge.  I anticipate discharge tomorrow   Subjective: Overall feels weak and tired but no new complaints  Review of Systems Otherwise negative except as per HPI, including: General: Denies fever, chills, night sweats or unintended weight loss. Resp: Denies cough, wheezing, shortness of breath. Cardiac: Denies chest pain, palpitations, orthopnea, paroxysmal nocturnal dyspnea. GI: Denies abdominal pain, nausea, vomiting, diarrhea or constipation GU: Denies dysuria, frequency, hesitancy or incontinence MS: Denies muscle aches, joint pain or swelling Neuro: Denies headache, neurologic deficits (focal weakness, numbness, tingling), abnormal gait Psych: Denies anxiety, depression, SI/HI/AVH Skin: Denies new rashes or lesions ID: Denies sick contacts, exotic exposures, travel  Examination:  Constitutional: Not in acute distress Respiratory: Clear to auscultation bilaterally Cardiovascular: Normal sinus rhythm, no rubs Abdomen: Nontender nondistended good bowel sounds Musculoskeletal: No edema noted Skin: No rashes seen Neurologic: CN 2-12 grossly intact.  And nonfocal Psychiatric: Normal judgment and insight. Alert and oriented x 3. Normal mood. Objective: Vitals:   04/20/20  1300 04/20/20 1948 04/21/20 0625 04/22/20 0300  BP: 109/64 121/71 (!)  129/91 140/68  Pulse:  60 69 68  Resp:  15 18 20   Temp: 97.8 F (36.6 C) 98 F (36.7 C) 98.2 F (36.8 C) 98.2 F (36.8 C)  TempSrc: Oral Oral Oral Oral  SpO2:  100% 99% 99%  Weight:    79.1 kg  Height:        Intake/Output Summary (Last 24 hours) at 04/22/2020 1038 Last data filed at 04/22/2020 0300 Gross per 24 hour  Intake 1041.69 ml  Output 900 ml  Net 141.69 ml   Filed Weights   04/18/20 2254 04/22/20 0300  Weight: 77.1 kg 79.1 kg     Data Reviewed:   CBC: Recent Labs  Lab 04/18/20 2257 04/20/20 0200 04/21/20 0211 04/22/20 0018  WBC 2.7* 2.9* 3.3* 3.5*  HGB 13.3 11.8* 11.6* 10.3*  HCT 38.0* 33.5* 33.8* 30.3*  MCV 86.4 85.0 87.8 88.9  PLT 307 245 268 [id number]   Basic Metabolic Panel: Recent Labs  Lab 04/21/20 0211 04/21/20 0737 04/21/20 1604 04/22/20 0018 04/22/20 0742  NA 120* 123* 126* 126* 128*  K 4.6 4.5 4.5 4.7 4.8  CL 90* 92* 93* 97* 98  CO2 20* 22 20* 21* 20*  GLUCOSE 207* 127* 134* 141* 177*  BUN 24* 23* 26* 28* 24*  CREATININE 2.11* 1.89* 1.86* 1.84* 1.63*  CALCIUM 8.8* 8.9 8.9 8.6* 9.0  MG 1.8  --   --  1.9  --    GFR: Estimated Creatinine Clearance: 51.3 mL/min (A) (by C-G formula based on SCr of 1.63 mg/dL (H)). Liver Function Tests: No results for input(s): AST, ALT, ALKPHOS, BILITOT, PROT, ALBUMIN in the last 168 hours. No results for input(s): LIPASE, AMYLASE in the last 168 hours. Recent Labs  Lab 04/16/20 1138  AMMONIA 131   Coagulation Profile: No results for input(s): INR, PROTIME in the last 168 hours. Cardiac Enzymes: No results for input(s): CKTOTAL, CKMB, CKMBINDEX, TROPONINI in the last 168 hours. BNP (last 3 results) No results for input(s): PROBNP in the last 8760 hours. HbA1C: No results for input(s): HGBA1C in the last 72 hours. CBG: Recent Labs  Lab 04/21/20 0731 04/21/20 1219 04/21/20 1724 04/21/20 2054 04/22/20 0749  GLUCAP 133* 243* 178* 134* 167*   Lipid Profile: No results for input(s): CHOL, HDL,  LDLCALC, TRIG, CHOLHDL, LDLDIRECT in the last 72 hours. Thyroid Function Tests: No results for input(s): TSH, T4TOTAL, FREET4, T3FREE, THYROIDAB in the last 72 hours. Anemia Panel: No results for input(s): VITAMINB12, FOLATE, FERRITIN, TIBC, IRON, RETICCTPCT in the last 72 hours. Sepsis Labs: No results for input(s): PROCALCITON, LATICACIDVEN in the last 168 hours.  Recent Results (from the past 240 hour(s))  SARS CORONAVIRUS 2 (TAT 6-24 HRS) Nasopharyngeal Nasopharyngeal Swab     Status: None   Collection Time: 04/19/20  6:27 AM   Specimen: Nasopharyngeal Swab  Result Value Ref Range Status   SARS Coronavirus 2 NEGATIVE NEGATIVE Final    Comment: (NOTE) SARS-CoV-2 target nucleic acids are NOT DETECTED. The SARS-CoV-2 RNA is generally detectable in upper and lower respiratory specimens during the acute phase of infection. Negative results do not preclude SARS-CoV-2 infection, do not rule out co-infections with other pathogens, and should not be used as the sole basis for treatment or other patient management decisions. Negative results must be combined with clinical observations, patient history, and epidemiological information. The expected result is Negative. Fact Sheet for Patients: SugarRoll.be Fact Sheet for Healthcare Providers: https://www.woods-mathews.com/  This test is not yet approved or cleared by the [place] and  has been authorized for detection and/or diagnosis of SARS-CoV-2 by FDA under an Emergency Use Authorization (EUA). This EUA will remain  in effect (meaning this test can be used) for the duration of the COVID-19 declaration under Section 56 4(b)(1) of the Act, 21 U.S.C. section 360bbb-3(b)(1), unless the authorization is terminated or revoked sooner. Performed at [facility], [address], [place], [place] [address]          Radiology Studies: No results found.      Scheduled Meds: .  aspirin EC  81 mg Oral Daily  . atorvastatin  20 mg Oral q1800  . enoxaparin (LOVENOX) injection  40 mg Subcutaneous Q24H  . folic acid  1 mg Oral Daily  . gabapentin  600 mg Oral QHS  . insulin aspart  0-15 Units Subcutaneous TID WC  . insulin aspart  0-5 Units Subcutaneous QHS  . isosorbide-hydrALAZINE  2 tablet Oral TID  . multivitamin with minerals  1 tablet Oral Daily  . sodium chloride  1 g Oral Q6H  . thiamine  100 mg Oral Daily   Or  . thiamine  100 mg Intravenous Daily   Continuous Infusions: . sodium chloride       LOS: 2 days   Time spent= 35 mins    Bo Teicher Arsenio Loader, MD [facility]  If 7PM-7AM, please contact night-coverage  04/22/2020, 10:38 AM

## 2020-04-23 ENCOUNTER — Other Ambulatory Visit (HOSPITAL_COMMUNITY): Payer: Self-pay

## 2020-04-23 DIAGNOSIS — E785 Hyperlipidemia, unspecified: Secondary | ICD-10-CM

## 2020-04-23 DIAGNOSIS — F1410 Cocaine abuse, uncomplicated: Secondary | ICD-10-CM

## 2020-04-23 DIAGNOSIS — I4821 Permanent atrial fibrillation: Secondary | ICD-10-CM

## 2020-04-23 LAB — BASIC METABOLIC PANEL
Anion gap: 6 (ref 5–15)
BUN: 20 mg/dL (ref 6–20)
CO2: 21 mmol/L — ABNORMAL LOW (ref 22–32)
Calcium: 8.7 mg/dL — ABNORMAL LOW (ref 8.9–10.3)
Chloride: 103 mmol/L (ref 98–111)
Creatinine, Ser: 1.48 mg/dL — ABNORMAL HIGH (ref 0.61–1.24)
GFR calc Af Amer: 59 mL/min — ABNORMAL LOW (ref >60)
GFR calc non Af Amer: 51 mL/min — ABNORMAL LOW (ref >60)
Glucose, Bld: 112 mg/dL — ABNORMAL HIGH (ref 70–99)
Potassium: 4.6 mmol/L (ref 3.5–5.1)
Sodium: 130 mmol/L — ABNORMAL LOW (ref 135–145)

## 2020-04-23 LAB — CBC
HCT: 28.8 % — ABNORMAL LOW (ref 39.0–52.0)
Hemoglobin: 9.6 g/dL — ABNORMAL LOW (ref 13.0–17.0)
MCH: 30.2 pg (ref 26.0–34.0)
MCHC: 33.3 g/dL (ref 30.0–36.0)
MCV: 90.6 fL (ref 80.0–100.0)
Platelets: 188 10*3/uL (ref 150–400)
RBC: 3.18 MIL/uL — ABNORMAL LOW (ref 4.22–5.81)
RDW: 13.8 % (ref 11.5–15.5)
WBC: 4.1 10*3/uL (ref 4.0–10.5)
nRBC: 0 % (ref 0.0–0.2)

## 2020-04-23 LAB — MAGNESIUM: Magnesium: 1.8 mg/dL (ref 1.7–2.4)

## 2020-04-23 LAB — GLUCOSE, CAPILLARY: Glucose-Capillary: 135 mg/dL — ABNORMAL HIGH (ref 70–99)

## 2020-04-23 MED ORDER — TORSEMIDE 20 MG PO TABS: 20.0000 mg | ORAL_TABLET | Freq: Two times a day (BID) | ORAL | 0 refills | Status: DC

## 2020-04-23 NOTE — Progress Notes (Signed)
Paramedicine Encounter    Patient ID: Brad [name], male    DOB: 08-06-1961, 59 y.o.   MRN: [id number]   Patient Care Team: Charlott Rakes, MD as PCP - General (Family Medicine) Debara Pickett Nadean Corwin, MD as PCP - Cardiology (Cardiology) Charlott Rakes, MD (Family Medicine) Charlott Rakes, MD (Family Medicine)  Patient Active Problem List   Diagnosis Date Noted  . Chronic combined systolic and diastolic CHF (congestive heart failure) ([place]) 04/19/2020  . Alcohol abuse with intoxication ([place]) 03/23/2020  . Hyponatremia 03/23/2020  . Fall 03/23/2020  . Normocytic anemia 03/23/2020  . Leukopenia 03/23/2020  . HTN (hypertension) 03/23/2020  . HLD (hyperlipidemia) 03/23/2020  . AKI (acute kidney injury) ([place]) 03/23/2020  . CHF (congestive heart failure) ([place]) 03/23/2020  . Alcohol abuse with intoxication ([place]) 03/05/2020  . Chronic systolic CHF (congestive heart failure) ([place]) 10/31/2019  . Acute systolic CHF (congestive heart failure) ([place]) 08/02/2019  . Diarrhea 07/29/2019  . Gastroenteritis 07/22/2019  . Hypomagnesemia 06/19/2019  . Chest pain 06/07/2019  . Elevated troponin 05/23/2019  . Tobacco abuse 05/23/2019  . Alcohol abuse 02/21/2019  . Frequent falls 01/17/2019  . Severe mitral regurgitation   . Fall 11/01/2018  . Hyponatremia 10/31/2018  . Chronic atrial fibrillation   . Chronic hyponatremia 08/16/2018  . NSVT (nonsustained ventricular tachycardia) ([place])   . Concussion with loss of consciousness   . Scalp laceration   . Trauma   . Permanent atrial fibrillation   . Hypertensive heart disease   . Shortness of breath   . Pyogenic inflammation of bone ([place])   . Cirrhosis ([place]) 03/23/2018  . Right ankle pain 03/23/2018  . Syncope 08/07/2017  . Acute on chronic combined systolic and diastolic CHF (congestive heart failure) ([place]) 07/09/2017  . Atrial flutter ([place]) 07/09/2017  . Pre-syncope 07/08/2017  . Neuropathy 05/08/2017  . Substance induced mood disorder  ([place]) 10/06/2016  . CVA (cerebral vascular accident) ([place]) 09/18/2016  . Left sided numbness   . Homelessness 08/21/2016  . S/P ORIF (open reduction internal fixation) fracture 08/01/2016  . CAD (coronary artery disease), native coronary artery 07/30/2016  . Surgery, elective   . Insomnia 07/22/2016  . NSTEMI (non-ST elevated myocardial infarction) ([place])   . Anemia 07/05/2016  . Thrombocytopenia ([place]) 07/05/2016  . Cocaine abuse ([place]) 07/02/2016  . Chest pain on breathing 07/01/2016  . Essential hypertension 07/01/2016  . DM type 2 (diabetes mellitus, type 2) ([place]) 07/01/2016  . Hypokalemia 07/01/2016  . CKD (chronic kidney disease) stage 3, GFR 30-59 ml/min 07/01/2016  . Painful diabetic neuropathy ([place]) 07/01/2016  . Acute hyponatremia 05/27/2016  . Polysubstance abuse ([place]) 05/27/2016  . Chronic hepatitis C with cirrhosis ([place]) 05/27/2016  . Depression 04/21/2012  . GERD (gastroesophageal reflux disease) 02/16/2012  . History of drug abuse ([place])   . Heroin addiction ([place]) 01/29/2012    Current Outpatient Medications:  .  albuterol (VENTOLIN HFA) 108 (90 Base) MCG/ACT inhaler, Inhale 2 puffs into the lungs every 6 (six) hours as needed for shortness of breath. , Disp: , Rfl:  .  atorvastatin (LIPITOR) 20 MG tablet, Take 1 tablet (20 mg total) by mouth daily at 6 PM., Disp: 30 tablet, Rfl: 1 .  DULoxetine (CYMBALTA) 60 MG capsule, Take 1 capsule (60 mg total) by mouth daily., Disp: 30 capsule, Rfl: 6 .  gabapentin (NEURONTIN) 300 MG capsule, Take 2 capsules (600 mg total) by mouth at bedtime., Disp: 60 capsule, Rfl: 3 .  isosorbide-hydrALAZINE (BIDIL) 20-37.5 MG tablet, Take 2 tablets by  mouth 3 (three) times daily., Disp: 180 tablet, Rfl: 1 .  spironolactone (ALDACTONE) 25 MG tablet, Take 1 tablet (25 mg total) by mouth daily., Disp: 30 tablet, Rfl: 1 .  torsemide (DEMADEX) 20 MG tablet, Take 1 tablet (20 mg total) by mouth 2 (two) times daily. Take 2 tablets every morning and  take 1 tablet every evening, Disp: 60 tablet, Rfl: 0 .  folic acid (FOLVITE) 1 MG tablet, Take 1 tablet (1 mg total) by mouth daily. (Patient not taking: Reported on 04/23/2020), Disp: 30 tablet, Rfl: 0 .  traMADol (ULTRAM) 50 MG tablet, Take 1 tablet (50 mg total) by mouth every 12 (twelve) hours as needed for moderate pain. (Patient not taking: Reported on 04/23/2020), Disp: 12 tablet, Rfl: 0 .  vitamin B-12 1000 MCG tablet, Take 1 tablet (1,000 mcg total) by mouth daily. (Patient not taking: Reported on 04/23/2020), Disp: 30 tablet, Rfl: 0 Allergies  Allergen Reactions  . Angiotensin Receptor Blockers Anaphylaxis and Other (See Comments)    (Angioedema also with Lisinopril, therefore ARB's are contraindicated)  . Lisinopril Anaphylaxis and Swelling    Throat swelling  . Pamelor [Nortriptyline Hcl] Anaphylaxis and Swelling    Throat swells     Social History   Socioeconomic History  . Marital status: Single    Spouse name: Not on file  . Number of children: 3  . Years of education: 2y college  . Highest education level: Not on file  Occupational History  . 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 Smoking status: Current Every Day Smoker    Packs/day: 0.50    Years: 28.00    Pack years: 14.00    Types: Cigarettes  . Smokeless tobacco: Never Used  Substance and Sexual Activity  . Alcohol use: Yes    Alcohol/week: 25.0 standard drinks    Types: 25 Cans of beer per week    Comment: "depends on the day"; reports not drinking every day, "I stopped about 2-3 wekes ago" - but drank yesterday  . Drug use: Not Currently    Types: IV, Cocaine, Heroin    Comment: 08/17/2018 "no heroin since 2013; I use cocaine ~ once/month, last use 03/21/2019  . Sexual activity: Not Currently  Other Topics Concern  . Not on file  Social History Narrative   ** Merged History Encounter **       ** Merged History Encounter **        Incarcerated from 2006-2010, then 10/2011-12/2011.  Has been trying to get sober (no heroin, alcohol since 10/2011).    Social Determinants of Health   Financial Resource Strain:   . Difficulty of Paying Living Expenses:   Food Insecurity: No Food Insecurity  . Worried About Charity fundraiser in the Last Year: Never true  . Ran Out of Food in the Last Year: Never true  Transportation Needs:   . Lack of Transportation (Medical):   Marland Kitchen Lack of Transportation (Non-Medical):   Physical Activity:   . Days of Exercise per Week:   . Minutes of Exercise per Session:   Stress:   . Feeling of Stress :   Social Connections:   . Frequency of Communication with Friends and Family:   . Frequency of Social Gatherings with Friends and Family:   . Attends Religious Services:   . Active Member of Clubs or Organizations:   . Attends Archivist Meetings:   .  Marital Status:   Intimate Partner Violence:   . Fear of Current or Ex-Partner:   . Emotionally Abused:   Marland Kitchen Physically Abused:   . Sexually Abused:     Physical Exam Vitals reviewed.  Constitutional:      Appearance: He is ill-appearing.  HENT:     Head: Normocephalic.     Mouth/Throat:     Mouth: Mucous membranes are moist.  Eyes:     Pupils: Pupils are equal, round, and reactive to light.  Cardiovascular:     Rate and Rhythm: Normal rate and regular rhythm.     Pulses: Normal pulses.     Heart sounds: Normal heart sounds.  Pulmonary:     Effort: Pulmonary effort is normal.     Breath sounds: Normal breath sounds.  Abdominal:     Palpations: Abdomen is soft.  Musculoskeletal:        General: Swelling present.     Cervical back: Normal range of motion.     Right lower leg: Edema present.     Left lower leg: Edema present.  Skin:    General: Skin is warm and dry.     Capillary Refill: Capillary refill takes less than 2 seconds.  Neurological:     Mental Status: He is alert. Mental status is at baseline.   Psychiatric:        Mood and Affect: Mood normal.     Arrived for home visit for Jordie who was seated on the couch alert but lethargic. Papa reports he was in the hospital and surgery, noted indicate he did not have surgery. Rithwik was oriented X4 upon questioning. Ronda states he needs his medication box filled. I was able to review and confirm medications. He is without folic acid, vitamin B and tramadol. Pill box was filled accordingly. Dom was made aware importance of remaining at home and to decrease amount of hospitalizations if it an be helped. Gwyndolyn Saxon agreed with same and stated he will try his best. I educated him on diet, medication compliance and safety in the home. He agreed with these things. Home visit complete. I plan to see patient in one week.       Future Appointments  Date Time Provider [place]  04/25/2020  1:00 PM Brunetta Genera, MD [facility] None  04/26/2020  2:00 PM Trula Slade, DPM [facility]  05/01/2020  1:30 PM Elsie Stain, MD [facility] None  05/03/2020  2:00 PM [facility] PA/NP [facility] None     ACTION: Home visit completed Next visit planned for one week

## 2020-04-23 NOTE — Discharge Summary (Addendum)
Physician Discharge Summary  Brad [name] DOB: Jan 06, 1961 DOA: 04/18/2020  PCP: Brad Rakes, MD  Admit date: 04/18/2020 Discharge date: 04/23/2020  Admitted From: Home Disposition: Home  Recommendations for Outpatient Follow-up:  1. Follow up with PCP in 1 weeks 2. Please obtain BMP in 3-5 days 3. Torsemide reduced to 20 mg twice daily 4. I have notified heart failure team regarding the changes and they will arrange for follow-up  Discharge Condition: Stable CODE STATUS: Full Diet recommendation: Heart healthy  Brief/Interim Summary: 59 year old with history of EtOH use, A. fib, CHF, HTN, multiple admissions presents with chest discomfort and diarrhea.  Found to have sodium of 119.  There was concerns of intravascular depletion therefore started on IV fluids and salt tablets.  Clinically he greatly improved after hydration, his sodium levels returned back to his baseline of 130.  He was feeling much better and was discharged with recommendations above.   Assessment & Plan:   Principal Problem:   Chest pain Active Problems:   Acute hyponatremia   Essential hypertension   DM type 2 (diabetes mellitus, type 2) (HCC)   CKD (chronic kidney disease) stage 3, GFR 30-59 ml/min   Cocaine abuse (HCC)   Permanent atrial fibrillation   Chronic atrial fibrillation   Tobacco abuse   Diarrhea   Hyponatremia   HLD (hyperlipidemia)   Chronic combined systolic and diastolic CHF (congestive heart failure) (HCC)  Acute on Chronic Hyponatremia -This was secondary to intravascular depletion.  Urine sodium <10.  After receiving salt tablets and normal saline his sodium levels returned to baseline of 130.  Recommend outpatient repeat lab work in 3-4 days  Chest pain, resolved -Atypical, noncardiac in nature.  Troponins remain flat, EKG is unremarkable.  Chest pain is reproducible.  Essential hypertension -Resume home meds  Hyperlipidemia -Continue atorvastatin 20  mg daily  Diarrhea -Resolved  Congestive heart failure with reduced ejection fraction, 35%. -Appears to be compensated.  Continue Aldactone.  Torsemide reduced to 20 mg twice daily.  Recommend outpatient follow-up with cardiology next 2 weeks.  Repeat lab work in next 3-4 days.  Recommended and educated on fluid intake and monitoring his weight daily.   I have notified the heart failure team regarding these changes and they will arrange for follow-up  Chronic afib -Not on anticoagulation due to recurrent falls.  ETOH dependence with cirrhosis Hepatitis C -Counseled to quit using alcohol but he continues to use it.  Alcohol withdrawal protocol.  Stage 3a CKD -Creatinine around baseline of 1.5.  Creatinine today 1.48  Diabetes mellitus type 2 -A1c 6.8 on 3/4 -I resume home regimen  Tobacco dependence -Counseled to quit using this  Unfortunately patient remains extremely noncompliant with all the instructions provided to him.  Continues to have recurrent hospital admissions for similar reasons    Discharge Diagnoses:  Principal Problem:   Chest pain Active Problems:   Acute hyponatremia   Essential hypertension   DM type 2 (diabetes mellitus, type 2) (HCC)   CKD (chronic kidney disease) stage 3, GFR 30-59 ml/min   Cocaine abuse (HCC)   Permanent atrial fibrillation   Chronic atrial fibrillation   Tobacco abuse   Diarrhea   Hyponatremia   HLD (hyperlipidemia)   Chronic combined systolic and diastolic CHF (congestive heart failure) ([place])    Consultations:  None  Subjective: Feels great, wishes to go home.  Discharge Exam: Vitals:   04/23/20 0448 04/23/20 1012  BP: 140/68 129/85  Pulse: 60 67  Resp: 20 16  Temp: 98.2 F (36.8 C) 98 F (36.7 C)  SpO2: 99% 98%   Vitals:   04/22/20 0300 04/22/20 2100 04/23/20 0448 04/23/20 1012  BP: 140/68 (!) 140/92 140/68 129/85  Pulse: 68 62 60 67  Resp: 20 20 20 16   Temp: 98.2 F (36.8 C) 98.2 F (36.8  C) 98.2 F (36.8 C) 98 F (36.7 C)  TempSrc: Oral Oral Oral Oral  SpO2: 99% 99% 99% 98%  Weight: 79.1 kg  81.2 kg   Height:        General: Pt is alert, awake, not in acute distress Cardiovascular: RRR, S1/S2 +, no rubs, no gallops Respiratory: CTA bilaterally, no wheezing, no rhonchi Abdominal: Soft, NT, ND, bowel sounds + Extremities: no edema, no cyanosis  Discharge Instructions   Allergies as of 04/23/2020      Reactions   Angiotensin Receptor Blockers Anaphylaxis, Other (See Comments)   (Angioedema also with Lisinopril, therefore ARB's are contraindicated)   Lisinopril Anaphylaxis, Swelling   Throat swelling   Pamelor [nortriptyline Hcl] Anaphylaxis, Swelling   Throat swells      Medication List    TAKE these medications   albuterol 108 (90 Base) MCG/ACT inhaler Commonly known as: VENTOLIN HFA Inhale 2 puffs into the lungs every 6 (six) hours as needed for shortness of breath.   atorvastatin 20 MG tablet Commonly known as: LIPITOR Take 1 tablet (20 mg total) by mouth daily at 6 PM. Notes to patient: Lowers cholesterol and triglycerides   cyanocobalamin 1000 MCG tablet Take 1 tablet (1,000 mcg total) by mouth daily. Notes to patient: Supplement    DULoxetine 60 MG capsule Commonly known as: CYMBALTA Take 1 capsule (60 mg total) by mouth daily. Notes to patient: Depression/pain   folic acid 1 MG tablet Commonly known as: FOLVITE Take 1 tablet (1 mg total) by mouth daily. Notes to patient: Supplement    gabapentin 300 MG capsule Commonly known as: NEURONTIN Take 2 capsules (600 mg total) by mouth at bedtime.   isosorbide-hydrALAZINE 20-37.5 MG tablet Commonly known as: BIDIL Take 2 tablets by mouth 3 (three) times daily. Notes to patient: Lowers blood pressure Increases blood flow to heart   spironolactone 25 MG tablet Commonly known as: ALDACTONE Take 1 tablet (25 mg total) by mouth daily. Notes to patient: Lowers blood pressure Decreases fluid  overload   torsemide 20 MG tablet Commonly known as: DEMADEX Take 1 tablet (20 mg total) by mouth 2 (two) times daily. Take 2 tablets every morning and take 1 tablet every evening What changed:   how much to take  additional instructions Notes to patient: Decreases fluid overload NEW DOSE 20 MG 2 TIMES A DAY   traMADol 50 MG tablet Commonly known as: ULTRAM Take 1 tablet (50 mg total) by mouth every 12 (twelve) hours as needed for moderate pain.      Follow-up Information    Hilty, Nadean Corwin, MD .   Specialty: Cardiology Contact information: [place] [address] [place], [place] Follow up.   Specialty: Home Health Services Why: [place] to call and schedule a visit.  Contact information: [place] [address] [phone]        Elsie Stain, MD Follow up on 05/01/2020.   Specialty: Pulmonary Disease Why: at 1:30 pm for hospital follow up appointment- if you cannot make this scheduled appointment please call office  to reschedule.  Contact information: [address] [address] [phone]          Allergies  Allergen Reactions  . Angiotensin Receptor Blockers Anaphylaxis and Other (See Comments)    (Angioedema also with Lisinopril, therefore ARB's are contraindicated)  . Lisinopril Anaphylaxis and Swelling    Throat swelling  . Pamelor [Nortriptyline Hcl] Anaphylaxis and Swelling    Throat swells    You were cared for by a hospitalist during your hospital stay. If you have any questions about your discharge medications or the care you received while you were in the hospital after you are discharged, you can call the unit and asked to speak with the hospitalist on call if the hospitalist that took care of you is not available. Once you are discharged, your primary care physician will handle any further medical  issues. Please note that no refills for any discharge medications will be authorized once you are discharged, as it is imperative that you return to your primary care physician (or establish a relationship with a primary care physician if you do not have one) for your aftercare needs so that they can reassess your need for medications and monitor your lab values.   Procedures/Studies: DG Chest 1 View  Result Date: 04/02/2020 CLINICAL DATA:  Chest pain, history of congestive heart failure and diabetes EXAM: CHEST  1 VIEW COMPARISON:  03/23/2020 FINDINGS: Single frontal view of the chest demonstrates an enlarged cardiac silhouette. There is increased vascular congestion and interval development of diffuse interstitial prominence. No dense consolidation. No effusion or pneumothorax. No acute bony abnormalities. IMPRESSION: 1. Findings consistent with early congestive heart failure. Electronically Signed   By: Randa Ngo M.D.   On: 04/02/2020 16:04   DG Chest 2 View  Result Date: 04/18/2020 CLINICAL DATA:  Chest pain EXAM: CHEST - 2 VIEW COMPARISON:  04/02/2020 FINDINGS: Cardiac shadow is mildly enlarged but stable. Aortic calcifications again seen. Lungs are well aerated bilaterally. No focal infiltrate or sizable effusion is seen. No bony abnormality is noted. IMPRESSION: No active cardiopulmonary disease. Electronically Signed   By: Inez Catalina M.D.   On: 04/18/2020 23:10   CT Head Wo Contrast  Result Date: 03/30/2020 CLINICAL DATA:  Syncope, fall, posterior headache, neck pain. EXAM: CT HEAD WITHOUT CONTRAST CT CERVICAL SPINE WITHOUT CONTRAST TECHNIQUE: Multidetector CT imaging of the head and cervical spine was performed following the standard protocol without intravenous contrast. Multiplanar CT image reconstructions of the cervical spine were also generated. COMPARISON:  03/16/2020 FINDINGS: CT HEAD FINDINGS Brain: No evidence of acute infarction, hemorrhage, hydrocephalus, extra-axial  collection or mass lesion/mass effect. Subcortical white matter and periventricular small vessel ischemic changes. Vascular: Intracranial atherosclerosis. Skull: Normal. Negative for fracture or focal lesion. Sinuses/Orbits: The visualized paranasal sinuses are essentially clear. The mastoid air cells are unopacified. Other: None. CT CERVICAL SPINE FINDINGS Alignment: Reversal of the normal mid cervical lordosis. Skull base and vertebrae: No acute fracture. No primary bone lesion or focal pathologic process. Soft tissues and spinal canal: No prevertebral fluid or swelling. No visible canal hematoma. Disc levels: Mild degenerative changes of the mid cervical spine. Spinal canal is patent. Upper chest: Visualized lung apices are clear. Other: Visualized thyroid is unremarkable. IMPRESSION: No evidence of acute intracranial abnormality. Small vessel ischemic changes. No evidence of traumatic injury to the cervical spine. Mild degenerative changes of the mid cervical spine. Electronically Signed   By: Julian Hy M.D.   On: 03/30/2020 20:42   CT  Cervical Spine Wo Contrast  Result Date: 03/30/2020 CLINICAL DATA:  Syncope, fall, posterior headache, neck pain. EXAM: CT HEAD WITHOUT CONTRAST CT CERVICAL SPINE WITHOUT CONTRAST TECHNIQUE: Multidetector CT imaging of the head and cervical spine was performed following the standard protocol without intravenous contrast. Multiplanar CT image reconstructions of the cervical spine were also generated. COMPARISON:  03/16/2020 FINDINGS: CT HEAD FINDINGS Brain: No evidence of acute infarction, hemorrhage, hydrocephalus, extra-axial collection or mass lesion/mass effect. Subcortical white matter and periventricular small vessel ischemic changes. Vascular: Intracranial atherosclerosis. Skull: Normal. Negative for fracture or focal lesion. Sinuses/Orbits: The visualized paranasal sinuses are essentially clear. The mastoid air cells are unopacified. Other: None. CT CERVICAL  SPINE FINDINGS Alignment: Reversal of the normal mid cervical lordosis. Skull base and vertebrae: No acute fracture. No primary bone lesion or focal pathologic process. Soft tissues and spinal canal: No prevertebral fluid or swelling. No visible canal hematoma. Disc levels: Mild degenerative changes of the mid cervical spine. Spinal canal is patent. Upper chest: Visualized lung apices are clear. Other: Visualized thyroid is unremarkable. IMPRESSION: No evidence of acute intracranial abnormality. Small vessel ischemic changes. No evidence of traumatic injury to the cervical spine. Mild degenerative changes of the mid cervical spine. Electronically Signed   By: Julian Hy M.D.   On: 03/30/2020 20:42   ECHOCARDIOGRAM COMPLETE  Result Date: 04/03/2020    ECHOCARDIOGRAM REPORT   Patient Name:   Brad [name] Date of Exam: 04/03/2020 Medical Rec #:  [id number]       Height:       71.0 in Accession #:    [id number]      Weight:       182.8 lb Date of Birth:  12-Jun-1961      BSA:          2.029 m Patient Age:    22 years        BP:           116/76 mmHg Patient Gender: M               HR:           60 bpm. Exam Location:  Inpatient Procedure: 2D Echo, Cardiac Doppler and Color Doppler Indications:    Chest pain 486.50/R07.9  History:        Patient has prior history of Echocardiogram examinations, most                 recent 08/22/2019. CAD, Arrythmias:Atrial Fibrillation; Risk                 Factors:Diabetes, Current Smoker and Hypertension. Polysubstance                 abuse. CKD.  Sonographer:    Clayton Lefort RDCS (AE) Referring Phys: [id number] [place] IMPRESSIONS  1. Left ventricular ejection fraction, by estimation, is 35 to 40%. The left ventricle has moderately decreased function. The left ventricle demonstrates global hypokinesis. The left ventricular internal cavity size was mildly dilated. There is mild eccentric left ventricular hypertrophy. Left ventricular diastolic function could not be evaluated.   2. Right ventricular systolic function is moderately reduced. The right ventricular size is normal. Tricuspid regurgitation signal is inadequate for assessing PA pressure.  3. Left atrial size was severely dilated.  4. Right atrial size was severely dilated.  5. The mitral valve is normal in structure. Moderate to severe mitral valve regurgitation.  6. Tricuspid valve regurgitation is moderate.  7. The aortic valve is normal  in structure. Aortic valve regurgitation is mild.  8. The inferior vena cava is dilated in size with <50% respiratory variability, suggesting right atrial pressure of 15 mmHg. Comparison(s): Prior images reviewed side by side. The left ventricular function has improved. The rhythm is now atrial fibrillation. The mitral insufficiency severity appears unchanged. FINDINGS  Left Ventricle: Left ventricular ejection fraction, by estimation, is 35 to 40%. The left ventricle has moderately decreased function. The left ventricle demonstrates global hypokinesis. The left ventricular internal cavity size was mildly dilated. There is mild eccentric left ventricular hypertrophy. Left ventricular diastolic function could not be evaluated due to atrial fibrillation. Left ventricular diastolic function could not be evaluated. Right Ventricle: The right ventricular size is normal. No increase in right ventricular wall thickness. Right ventricular systolic function is moderately reduced. Tricuspid regurgitation signal is inadequate for assessing PA pressure. The tricuspid regurgitant velocity is 3.13 m/s, and with an assumed right atrial pressure of 15 mmHg, the estimated right ventricular systolic pressure is 99991111 mmHg. Left Atrium: Left atrial size was severely dilated. Right Atrium: Right atrial size was severely dilated. Pericardium: Trivial pericardial effusion is present. Mitral Valve: The mitral valve is normal in structure. There is mild thickening of the mitral valve leaflet(s). Mild mitral annular  calcification. Moderate to severe mitral valve regurgitation, with centrally-directed jet. MV peak gradient, 11.2 mmHg. The mean mitral valve gradient is 3.0 mmHg. Tricuspid Valve: The tricuspid valve is normal in structure. Tricuspid valve regurgitation is moderate. Aortic Valve: The aortic valve is normal in structure. Aortic valve regurgitation is mild. Aortic regurgitation PHT measures 973 msec. Aortic valve mean gradient measures 3.5 mmHg. Aortic valve peak gradient measures 6.9 mmHg. Aortic valve area, by VTI measures 1.82 cm. Pulmonic Valve: The pulmonic valve was normal in structure. Pulmonic valve regurgitation is mild. Aorta: The aortic root is normal in size and structure. Venous: The inferior vena cava is dilated in size with less than 50% respiratory variability, suggesting right atrial pressure of 15 mmHg. IAS/Shunts: No atrial level shunt detected by color flow Doppler.  LEFT VENTRICLE PLAX 2D LVIDd:         6.17 cm LVIDs:         4.98 cm LV PW:         1.43 cm LV IVS:        1.26 cm LVOT diam:     2.30 cm LV SV:         47 LV SV Index:   23 LVOT Area:     4.15 cm  LV Volumes (MOD) LV vol d, MOD A2C: 169.0 ml LV vol d, MOD A4C: 204.0 ml LV vol s, MOD A2C: 94.9 ml LV vol s, MOD A4C: 121.0 ml LV SV MOD A2C:     74.1 ml LV SV MOD A4C:     204.0 ml LV SV MOD BP:      76.8 ml RIGHT VENTRICLE            IVC RV Basal diam:  3.41 cm    IVC diam: 2.97 cm RV S prime:     6.37 cm/s TAPSE (M-mode): 1.4 cm LEFT ATRIUM              Index       RIGHT ATRIUM           Index LA diam:        4.70 cm  2.32 cm/m  RA Area:     29.70 cm LA Vol (A2C):  166.0 ml 81.80 ml/m RA Volume:   113.00 ml 55.68 ml/m LA Vol (A4C):   111.0 ml 54.70 ml/m LA Biplane Vol: 144.0 ml 70.96 ml/m  AORTIC VALVE AV Area (Vmax):    1.97 cm AV Area (Vmean):   1.74 cm AV Area (VTI):     1.82 cm AV Vmax:           131.50 cm/s AV Vmean:          88.850 cm/s AV VTI:            0.255 m AV Peak Grad:      6.9 mmHg AV Mean Grad:      3.5 mmHg  LVOT Vmax:         62.50 cm/s LVOT Vmean:        37.300 cm/s LVOT VTI:          0.112 m LVOT/AV VTI ratio: 0.44 AI PHT:            973 msec  AORTA Ao Root diam: 3.70 cm Ao Asc diam:  3.70 cm MITRAL VALVE                 TRICUSPID VALVE MV Peak grad: 11.2 mmHg      TR Peak grad:   39.2 mmHg MV Mean grad: 3.0 mmHg       TR Vmax:        313.00 cm/s MV Vmax:      1.67 m/s MV Vmean:     67.4 cm/s      SHUNTS MR Peak grad:    107.7 mmHg  Systemic VTI:  0.11 m MR Mean grad:    70.0 mmHg   Systemic Diam: 2.30 cm MR Vmax:         519.00 cm/s MR Vmean:        391.0 cm/s MR PISA:         3.08 cm MR PISA Eff ROA: 18 mm MR PISA Radius:  0.70 cm Mihai Croitoru MD Electronically signed by Sanda Klein MD Signature Date/Time: 04/03/2020/3:28:58 PM    Final       The results of significant diagnostics from this hospitalization (including imaging, microbiology, ancillary and laboratory) are listed below for reference.     Microbiology: Recent Results (from the past 240 hour(s))  SARS CORONAVIRUS 2 (TAT 6-24 HRS) Nasopharyngeal Nasopharyngeal Swab     Status: None   Collection Time: 04/19/20  6:27 AM   Specimen: Nasopharyngeal Swab  Result Value Ref Range Status   SARS Coronavirus 2 NEGATIVE NEGATIVE Final    Comment: (NOTE) SARS-CoV-2 target nucleic acids are NOT DETECTED. The SARS-CoV-2 RNA is generally detectable in upper and lower respiratory specimens during the acute phase of infection. Negative results do not preclude SARS-CoV-2 infection, do not rule out co-infections with other pathogens, and should not be used as the sole basis for treatment or other patient management decisions. Negative results must be combined with clinical observations, patient history, and epidemiological information. The expected result is Negative. Fact Sheet for Patients: SugarRoll.be Fact Sheet for Healthcare Providers: https://www.woods-mathews.com/ This test is not yet  approved or cleared by the [place] FDA and  has been authorized for detection and/or diagnosis of SARS-CoV-2 by FDA under an Emergency Use Authorization (EUA). This EUA will remain  in effect (meaning this test can be used) for the duration of the COVID-19 declaration under Section 56 4(b)(1) of the Act, 21 U.S.C. section 360bbb-3(b)(1), unless the authorization is terminated or  revoked sooner. Performed at [facility], [place] [address], [place], [place] [address]      Labs: BNP (last 3 results) Recent Labs    01/10/20 1622 01/16/20 1210 04/20/20 1254  BNP 1,118.5* 476.0* 123456*   Basic Metabolic Panel: Recent Labs  Lab 04/21/20 0211 04/21/20 0737 04/21/20 1604 04/22/20 0018 04/22/20 0742 04/22/20 1639 04/23/20 0403  NA 120*   < > 126* 126* 128* 130* 130*  K 4.6   < > 4.5 4.7 4.8 4.6 4.6  CL 90*   < > 93* 97* 98 102 103  CO2 20*   < > 20* 21* 20* 21* 21*  GLUCOSE 207*   < > 134* 141* 177* 122* 112*  BUN 24*   < > 26* 28* 24* 22* 20  CREATININE 2.11*   < > 1.86* 1.84* 1.63* 1.60* 1.48*  CALCIUM 8.8*   < > 8.9 8.6* 9.0 8.8* 8.7*  MG 1.8  --   --  1.9  --   --  1.8   < > = values in this interval not displayed.   Liver Function Tests: No results for input(s): AST, ALT, ALKPHOS, BILITOT, PROT, ALBUMIN in the last 168 hours. No results for input(s): LIPASE, AMYLASE in the last 168 hours. No results for input(s): AMMONIA in the last 168 hours. CBC: Recent Labs  Lab 04/18/20 2257 04/20/20 0200 04/21/20 0211 04/22/20 0018 04/23/20 0403  WBC 2.7* 2.9* 3.3* 3.5* 4.1  HGB 13.3 11.8* 11.6* 10.3* 9.6*  HCT 38.0* 33.5* 33.8* 30.3* 28.8*  MCV 86.4 85.0 87.8 88.9 90.6  PLT 307 245 268 221 188   Cardiac Enzymes: No results for input(s): CKTOTAL, CKMB, CKMBINDEX, TROPONINI in the last 168 hours. BNP: Invalid input(s): POCBNP CBG: Recent Labs  Lab 04/22/20 0749 04/22/20 1207 04/22/20 1720 04/22/20 2058 04/23/20 0730  GLUCAP 167* 127* 157* 139* 135*    D-Dimer No results for input(s): DDIMER in the last 72 hours. Hgb A1c No results for input(s): HGBA1C in the last 72 hours. Lipid Profile No results for input(s): CHOL, HDL, LDLCALC, TRIG, CHOLHDL, LDLDIRECT in the last 72 hours. Thyroid function studies No results for input(s): TSH, T4TOTAL, T3FREE, THYROIDAB in the last 72 hours.  Invalid input(s): FREET3 Anemia work up No results for input(s): VITAMINB12, FOLATE, FERRITIN, TIBC, IRON, RETICCTPCT in the last 72 hours. Urinalysis    Component Value Date/Time   COLORURINE STRAW (A) 03/30/2020 1928   APPEARANCEUR CLEAR 03/30/2020 1928   LABSPEC 1.002 (L) 03/30/2020 1928   PHURINE 6.0 03/30/2020 1928   GLUCOSEU NEGATIVE 03/30/2020 1928   HGBUR NEGATIVE 03/30/2020 1928   BILIRUBINUR NEGATIVE 03/30/2020 1928   BILIRUBINUR neg 05/08/2017 1503   KETONESUR NEGATIVE 03/30/2020 1928   PROTEINUR 30 (A) 03/30/2020 1928   UROBILINOGEN 0.2 05/08/2017 1503   UROBILINOGEN 1.0 04/26/2012 1328   NITRITE NEGATIVE 03/30/2020 1928   LEUKOCYTESUR NEGATIVE 03/30/2020 1928   Sepsis Labs Invalid input(s): PROCALCITONIN,  WBC,  LACTICIDVEN Microbiology Recent Results (from the past 240 hour(s))  SARS CORONAVIRUS 2 (TAT 6-24 HRS) Nasopharyngeal Nasopharyngeal Swab     Status: None   Collection Time: 04/19/20  6:27 AM   Specimen: Nasopharyngeal Swab  Result Value Ref Range Status   SARS Coronavirus 2 NEGATIVE NEGATIVE Final    Comment: (NOTE) SARS-CoV-2 target nucleic acids are NOT DETECTED. The SARS-CoV-2 RNA is generally detectable in upper and lower respiratory specimens during the acute phase of infection. Negative results do not preclude SARS-CoV-2 infection, do not rule out  co-infections with other pathogens, and should not be used as the sole basis for treatment or other patient management decisions. Negative results must be combined with clinical observations, patient history, and epidemiological information. The expected result is  Negative. Fact Sheet for Patients: SugarRoll.be Fact Sheet for Healthcare Providers: https://www.woods-mathews.com/ This test is not yet approved or cleared by the [place] FDA and  has been authorized for detection and/or diagnosis of SARS-CoV-2 by FDA under an Emergency Use Authorization (EUA). This EUA will remain  in effect (meaning this test can be used) for the duration of the COVID-19 declaration under Section 56 4(b)(1) of the Act, 21 U.S.C. section 360bbb-3(b)(1), unless the authorization is terminated or revoked sooner. Performed at [facility], [place] [address], [place], [place] [address]      Time coordinating discharge:  I have spent 35 minutes face to face with the patient and on the ward discussing the patients care, assessment, plan and disposition with other care givers. >50% of the time was devoted counseling the patient about the risks and benefits of treatment/Discharge disposition and coordinating care.   SIGNED:   Damita Lack, MD  [facility] 04/23/2020, 12:06 PM   If 7PM-7AM, please contact night-coverage

## 2020-04-23 NOTE — TOC Transition Note (Signed)
Transition of Care [facility]) - CM/SW Discharge Note   Patient Details  Name: Brad [name] MRN: [id number] Date of Birth: December 13, 1961  Transition of Care [facility]) CM/SW Contact:  Bethena Roys, RN Phone Number: 04/23/2020, 10:43 AM   Clinical Narrative:  Hospital follow up appointment scheduled for this patient with Dr. Joya Gaskins on May 11th at 1330. [name] with [facility] will follow the patient as well. Case Manager called [facility]- plan will be to resume home health physical therapy once cardiology has cleared the patient per [facility] Liaison. No further needs from Case Manager at this time.     Barriers to Discharge: Continued Medical Work up   Patient Goals and CMS Choice Patient states their goals for this hospitalization and ongoing recovery are:: to return home with roommate in apartment  Discharge Plan and Services In-house Referral: NA Discharge Planning Services: CM Consult Post Acute Care Choice: [place], Resumption of Svcs/PTA Provider(re[facility] out to [facility] to see if can accept back.)          DME Arranged: N/A    HH Arranged: PT HH Agency: [facility](call placed to see if can return.)      Readmission Risk Interventions Readmission Risk Prevention Plan 04/20/2020 03/26/2020 03/07/2020  Transportation Screening Complete Complete Complete  PCP or Specialist Appt within 5-7 Days - Complete -  Home Care Screening - Complete -  Medication Review (RN CM) - Complete -  Medication Review Press photographer) Complete - Complete  PCP or Specialist appointment within 3-5 days of discharge - - Complete  PCP/Specialist Appt Not Complete comments - - -  [place] or Home Care Consult Complete - Complete  HRI or Home Care Consult Pt Refusal Comments - - -  SW Recovery Care/Counseling Consult Complete - Complete  Palliative Care Screening Not Applicable - Not [place] Not Applicable - Not Applicable  Some recent data might be hidden

## 2020-04-24 ENCOUNTER — Telehealth: Payer: Self-pay

## 2020-04-24 NOTE — Telephone Encounter (Signed)
Transition Care Management Follow-up Telephone Call Date of discharge and from where: 04/23/2020, [facility]  Call placed to patient # [phone], message left with call back requested to this CM.   Patient has appt at [facility] with Dr Joya Gaskins 05/01/2020.

## 2020-04-25 ENCOUNTER — Other Ambulatory Visit: Payer: Self-pay

## 2020-04-25 ENCOUNTER — Telehealth: Payer: Self-pay

## 2020-04-25 ENCOUNTER — Inpatient Hospital Stay: Payer: Medicaid Other | Attending: Hematology | Admitting: Hematology

## 2020-04-25 VITALS — BP 126/79 | HR 65 | Temp 98.3°F | Resp 18 | Ht 71.0 in | Wt 175.6 lb

## 2020-04-25 DIAGNOSIS — M129 Arthropathy, unspecified: Secondary | ICD-10-CM | POA: Diagnosis not present

## 2020-04-25 DIAGNOSIS — B1920 Unspecified viral hepatitis C without hepatic coma: Secondary | ICD-10-CM | POA: Diagnosis not present

## 2020-04-25 DIAGNOSIS — F329 Major depressive disorder, single episode, unspecified: Secondary | ICD-10-CM | POA: Diagnosis not present

## 2020-04-25 DIAGNOSIS — N183 Chronic kidney disease, stage 3 unspecified: Secondary | ICD-10-CM | POA: Insufficient documentation

## 2020-04-25 DIAGNOSIS — Z79899 Other long term (current) drug therapy: Secondary | ICD-10-CM | POA: Diagnosis not present

## 2020-04-25 DIAGNOSIS — F1010 Alcohol abuse, uncomplicated: Secondary | ICD-10-CM | POA: Diagnosis not present

## 2020-04-25 DIAGNOSIS — I428 Other cardiomyopathies: Secondary | ICD-10-CM | POA: Insufficient documentation

## 2020-04-25 DIAGNOSIS — E1122 Type 2 diabetes mellitus with diabetic chronic kidney disease: Secondary | ICD-10-CM | POA: Insufficient documentation

## 2020-04-25 DIAGNOSIS — I130 Hypertensive heart and chronic kidney disease with heart failure and stage 1 through stage 4 chronic kidney disease, or unspecified chronic kidney disease: Secondary | ICD-10-CM | POA: Insufficient documentation

## 2020-04-25 DIAGNOSIS — D709 Neutropenia, unspecified: Secondary | ICD-10-CM

## 2020-04-25 DIAGNOSIS — G629 Polyneuropathy, unspecified: Secondary | ICD-10-CM | POA: Insufficient documentation

## 2020-04-25 DIAGNOSIS — I5042 Chronic combined systolic (congestive) and diastolic (congestive) heart failure: Secondary | ICD-10-CM | POA: Diagnosis not present

## 2020-04-25 DIAGNOSIS — F17210 Nicotine dependence, cigarettes, uncomplicated: Secondary | ICD-10-CM | POA: Diagnosis not present

## 2020-04-25 DIAGNOSIS — K7460 Unspecified cirrhosis of liver: Secondary | ICD-10-CM | POA: Insufficient documentation

## 2020-04-25 DIAGNOSIS — D72819 Decreased white blood cell count, unspecified: Secondary | ICD-10-CM | POA: Insufficient documentation

## 2020-04-25 DIAGNOSIS — I083 Combined rheumatic disorders of mitral, aortic and tricuspid valves: Secondary | ICD-10-CM | POA: Diagnosis not present

## 2020-04-25 DIAGNOSIS — Z809 Family history of malignant neoplasm, unspecified: Secondary | ICD-10-CM | POA: Diagnosis not present

## 2020-04-25 DIAGNOSIS — I4891 Unspecified atrial fibrillation: Secondary | ICD-10-CM | POA: Insufficient documentation

## 2020-04-25 MED ORDER — ALBUTEROL SULFATE HFA 108 (90 BASE) MCG/ACT IN AERS: 2.0000 | INHALATION_SPRAY | Freq: Four times a day (QID) | RESPIRATORY_TRACT | 0 refills | Status: DC | PRN
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
## 2020-04-25 NOTE — Patient Instructions (Signed)
Thank you for choosing [facility] to provide your oncology and hematology care.   Should you have questions after your visit to the [facility] ([facility]), please contact this office at [phone] between 8:30 AM and 4:30 PM.  Voice mails left after 4:00 PM may not be returned until the following business day.  Calls received after 4:30 PM will be answered by an off-site Nurse Triage Line.    Prescription Refills:  Please have your pharmacy contact us directly for most prescription requests.  Contact the office directly for refills of narcotics (pain medications). Allow 48-72 hours for refills.  Appointments: Please contact the [facility] scheduling department [phone] for questions regarding [facility] appointment scheduling.  Contact the schedulers with any scheduling changes so that your appointment can be rescheduled in a timely manner.   Central Scheduling for [place] [phone] - Call to schedule procedures such as PET scans, CT scans, MRI, Ultrasound, etc.  To afford each patient quality time with our providers, please arrive 30 minutes before your scheduled appointment time.  If you arrive late for your appointment, you may be asked to reschedule.  We strive to give you quality time with our providers, and arriving late affects you and other patients whose appointments are after yours. If you are a no show for multiple scheduled visits, you may be dismissed from the clinic at the providers discretion.     Resources: [facility] Social Workers [phone] for additional information on assistance programs or assistance connecting with community support programs   [place] DSS  336-641-3447: Information regarding food stamps, Medicaid, and utility assistance [place]AT 336-333-6589   [place] [employer]'s shared-ride transportation service for eligible riders who have a disability that prevents them from riding the fixed route bus.   Medicare Rights Center  800-333-4114 Helps people with Medicare understand their rights and benefits, navigate the Medicare system, and secure the quality healthcare they deserve [employer] 800-227-2345 Assists patients locate various types of support and financial assistance Cancer Care: 1-800-813-HOPE (4673) Provides financial assistance, online support groups, medication/co-pay assistance.   Transportation Assistance for appointments at [facility]: Transportation Coordinator [phone]  Again, thank you for choosing [facility] for your care.       

## 2020-04-25 NOTE — Telephone Encounter (Signed)
Transition Care Management Follow-up Telephone Call Date of discharge and from where: 04/23/2020, [facility].  Call placed to patient. His roommate answered and said that he was sleeping.  Then the patient said that he would call back.  Patient has an appt with Dr Joya Gaskins 05/01/2020

## 2020-04-25 NOTE — Telephone Encounter (Signed)
Refilled

## 2020-04-25 NOTE — Telephone Encounter (Signed)
From the discharge call.  He has an appointment with Dr Joya Gaskins, 05/01/2020.    He said that he saw the oncologist today.  He stated  that the doctor told him that his WBC was fine.  They also talked about cirrhosis and Hep C and  the need to be sober for 6 months  prior to any treatment for Hep C.  The patient said that he was not aware that he has cirrhosis and they discussed the consequences with his health/life if he is not complaint with remaining sober.   he said that his legs are starting to swell again and he is concerned because his torsemide dose was decreased to 20 mg twice daily.  Instructed him to contact the cardiologist about this concern and he said he would call.     He said that he needs a refill of  Albuterol inhaler.

## 2020-04-25 NOTE — Telephone Encounter (Signed)
Call received from the patient.  Transition Care Management Follow-up Telephone Call  Date of discharge and from where: 04/23/2020, [facility]   How have you been since you were released from the hospital? He said that he just saw the oncologist today.  He stated  that the doctor told him that his WBC was fine.  He also talked about cirrhosis and Hep C and  the need to be sober for 6 months  prior to any treatment for Hep C.  The patient said that he was not aware that he has cirrhosis and they discussed the consequences with his health/life if he is not complaint with remaining sober.   Any questions or concerns? he said that his legs are starting to swell again and he is concerned because his torsemide dose was decreased to 20 mg twice daily.  Instructed him to contact the cardiologist about this concern and he said he would call.    Items Reviewed:  Did the pt receive and understand the discharge instructions provided?  yes  Medications obtained and verified?  [name], EMT reviewed his medications after he came home from the hospital and she assists him with medication management. He said that he needs a refill of  Albuterol inhaler.   Any new allergies since your discharge?  none reported.   Do you have support at home?  he has a roommate who provides support  Other (ie: DME, [place], etc) community paramedic visits weekly  Has cane, walker and glucometer  As per hospital CM, [facility] will resume home PT after patient is cleared by cardiology.  He needs to contact the [employer] about documentation that is needed for his housing application.    Needs scale  Functional Questionnaire: (I = Independent and D = Dependent) ADL's: independent   Follow up appointments reviewed:    PCP Hospital f/u appt confirmed?Dr  Joya Gaskins 05/01/2020  [facility] f/u appt confirmed?  Podiatry appointment tomorrow - 04/26/2020 and CHF clinic 05/03/2020  Are  transportation arrangements needed?  no , his friend drives  If their condition worsens, is the pt aware to call  their PCP or go to the ED? yes   Was the patient provided with contact information for the PCP's office or ED? he has the number for [facility]  Was the pt encouraged to call back with questions or concerns?   yes

## 2020-04-26 ENCOUNTER — Ambulatory Visit: Payer: Self-pay | Admitting: Podiatry

## 2020-04-30 NOTE — Telephone Encounter (Signed)
Call received from patient.  He explained he has an appointment at [facility]. He also said he plans to drop off the documents at the housing authority that have been requested for his housing application.  He will need to schedule a hospital follow up appointment at [facility]  as his appointment for tomorrow was cancelled.

## 2020-05-01 ENCOUNTER — Ambulatory Visit: Payer: Medicaid Other | Admitting: Critical Care Medicine

## 2020-05-03 ENCOUNTER — Other Ambulatory Visit: Payer: Self-pay

## 2020-05-03 ENCOUNTER — Emergency Department (HOSPITAL_COMMUNITY): Payer: Medicaid Other

## 2020-05-03 ENCOUNTER — Encounter (HOSPITAL_COMMUNITY): Payer: Self-pay | Admitting: *Deleted

## 2020-05-03 ENCOUNTER — Emergency Department (HOSPITAL_COMMUNITY)
Admission: EM | Admit: 2020-05-03 | Discharge: 2020-05-03 | Disposition: A | Discharge status: Home / Self Care | Payer: Medicaid Other | Attending: Emergency Medicine | Admitting: Emergency Medicine

## 2020-05-03 ENCOUNTER — Encounter (HOSPITAL_COMMUNITY): Payer: Self-pay

## 2020-05-03 ENCOUNTER — Other Ambulatory Visit (HOSPITAL_COMMUNITY): Payer: Self-pay

## 2020-05-03 ENCOUNTER — Ambulatory Visit (HOSPITAL_COMMUNITY)
Admission: RE | Admit: 2020-05-03 | Discharge: 2020-05-03 | Disposition: A | Discharge status: Home / Self Care | Payer: Medicaid Other | Source: Ambulatory Visit | Attending: Cardiology | Admitting: Cardiology

## 2020-05-03 VITALS — BP 130/82 | HR 85 | Wt 180.4 lb

## 2020-05-03 DIAGNOSIS — F1190 Opioid use, unspecified, uncomplicated: Secondary | ICD-10-CM | POA: Insufficient documentation

## 2020-05-03 DIAGNOSIS — Z7901 Long term (current) use of anticoagulants: Secondary | ICD-10-CM | POA: Insufficient documentation

## 2020-05-03 DIAGNOSIS — M1990 Unspecified osteoarthritis, unspecified site: Secondary | ICD-10-CM | POA: Diagnosis not present

## 2020-05-03 DIAGNOSIS — E1122 Type 2 diabetes mellitus with diabetic chronic kidney disease: Secondary | ICD-10-CM | POA: Insufficient documentation

## 2020-05-03 DIAGNOSIS — R0602 Shortness of breath: Secondary | ICD-10-CM | POA: Insufficient documentation

## 2020-05-03 DIAGNOSIS — I4892 Unspecified atrial flutter: Secondary | ICD-10-CM | POA: Insufficient documentation

## 2020-05-03 DIAGNOSIS — F17210 Nicotine dependence, cigarettes, uncomplicated: Secondary | ICD-10-CM | POA: Diagnosis not present

## 2020-05-03 DIAGNOSIS — M436 Torticollis: Secondary | ICD-10-CM | POA: Diagnosis not present

## 2020-05-03 DIAGNOSIS — Z79899 Other long term (current) drug therapy: Secondary | ICD-10-CM | POA: Insufficient documentation

## 2020-05-03 DIAGNOSIS — E871 Hypo-osmolality and hyponatremia: Secondary | ICD-10-CM | POA: Diagnosis not present

## 2020-05-03 DIAGNOSIS — I130 Hypertensive heart and chronic kidney disease with heart failure and stage 1 through stage 4 chronic kidney disease, or unspecified chronic kidney disease: Secondary | ICD-10-CM | POA: Diagnosis not present

## 2020-05-03 DIAGNOSIS — E7800 Pure hypercholesterolemia, unspecified: Secondary | ICD-10-CM | POA: Diagnosis not present

## 2020-05-03 DIAGNOSIS — I428 Other cardiomyopathies: Secondary | ICD-10-CM | POA: Insufficient documentation

## 2020-05-03 DIAGNOSIS — F329 Major depressive disorder, single episode, unspecified: Secondary | ICD-10-CM | POA: Diagnosis not present

## 2020-05-03 DIAGNOSIS — Z9119 Patient's noncompliance with other medical treatment and regimen: Secondary | ICD-10-CM | POA: Diagnosis not present

## 2020-05-03 DIAGNOSIS — I5042 Chronic combined systolic (congestive) and diastolic (congestive) heart failure: Secondary | ICD-10-CM | POA: Insufficient documentation

## 2020-05-03 DIAGNOSIS — R296 Repeated falls: Secondary | ICD-10-CM | POA: Insufficient documentation

## 2020-05-03 DIAGNOSIS — N183 Chronic kidney disease, stage 3 unspecified: Secondary | ICD-10-CM | POA: Insufficient documentation

## 2020-05-03 DIAGNOSIS — K7460 Unspecified cirrhosis of liver: Secondary | ICD-10-CM | POA: Insufficient documentation

## 2020-05-03 DIAGNOSIS — I5022 Chronic systolic (congestive) heart failure: Secondary | ICD-10-CM | POA: Diagnosis not present

## 2020-05-03 DIAGNOSIS — F1911 Other psychoactive substance abuse, in remission: Secondary | ICD-10-CM | POA: Diagnosis not present

## 2020-05-03 DIAGNOSIS — E1140 Type 2 diabetes mellitus with diabetic neuropathy, unspecified: Secondary | ICD-10-CM | POA: Insufficient documentation

## 2020-05-03 DIAGNOSIS — I4819 Other persistent atrial fibrillation: Secondary | ICD-10-CM | POA: Diagnosis not present

## 2020-05-03 DIAGNOSIS — M542 Cervicalgia: Secondary | ICD-10-CM | POA: Diagnosis present

## 2020-05-03 DIAGNOSIS — F1010 Alcohol abuse, uncomplicated: Secondary | ICD-10-CM | POA: Diagnosis not present

## 2020-05-03 LAB — BASIC METABOLIC PANEL
Anion gap: 19 — ABNORMAL HIGH (ref 5–15)
BUN: 12 mg/dL (ref 6–20)
CO2: 16 mmol/L — ABNORMAL LOW (ref 22–32)
Calcium: 8.9 mg/dL (ref 8.9–10.3)
Chloride: 93 mmol/L — ABNORMAL LOW (ref 98–111)
Creatinine, Ser: 1.3 mg/dL — ABNORMAL HIGH (ref 0.61–1.24)
GFR calc Af Amer: >60 mL/min (ref >60)
GFR calc non Af Amer: 59 mL/min — ABNORMAL LOW (ref >60)
Glucose, Bld: 119 mg/dL — ABNORMAL HIGH (ref 70–99)
Potassium: 3.6 mmol/L (ref 3.5–5.1)
Sodium: 128 mmol/L — ABNORMAL LOW (ref 135–145)

## 2020-05-03 LAB — BRAIN NATRIURETIC PEPTIDE: B Natriuretic Peptide: 1538.7 pg/mL — ABNORMAL HIGH (ref 0.0–100.0)

## 2020-05-03 MED ORDER — LIDOCAINE 5 % EX PTCH: 1.0000 | MEDICATED_PATCH | CUTANEOUS | 0 refills | Status: DC

## 2020-05-03 MED ORDER — TIZANIDINE HCL 2 MG PO TABS
2.0000 mg | ORAL_TABLET | Freq: Three times a day (TID) | ORAL | 0 refills | Status: DC | PRN
Start: 2020-05-03 — End: 2020-06-05

## 2020-05-03 MED ORDER — LIDOCAINE 5 % EX PTCH
1.0000 | MEDICATED_PATCH | CUTANEOUS | Status: DC
  Administered 2020-05-03: 1 via TRANSDERMAL
  Filled 2020-05-03: qty 1

## 2020-05-03 MED ORDER — TIZANIDINE HCL 4 MG PO TABS
2.0000 mg | ORAL_TABLET | Freq: Once | ORAL | Status: AC
  Administered 2020-05-03: 2 mg via ORAL
  Filled 2020-05-03: qty 1

## 2020-05-03 MED ORDER — TORSEMIDE 20 MG PO TABS: ORAL_TABLET | ORAL | 3 refills | Status: DC

## 2020-05-03 NOTE — Progress Notes (Signed)
Arrived for clinic visit for Brad [name] who was alert and oriented but seemed "down" today. Tavish reports he feels like his sodium is down again he reports having diarrhea and vomiting the last few days but also thinks his fluid is up. [place] weight today noted to be 177.6lbs. Red Clip reading 41%. [place] increased torsemide to 40mg  in the AM and 20mg  in the evening. I made correct adjustments. Clinic wants labs in one week on May 20th at 1230 and 4 week follow up on June 24 at 1500. I will see patient at lab visit in one week.    Verify meds with PCP: -Amitryp. -Remeron    Pill box filled accordingly. Clinic visit complete.

## 2020-05-03 NOTE — Progress Notes (Signed)
  REDS VEST READING= 41 CHEST RULER=29  VEST FITTING TASKS: POSTURE=sitting  STATION= C   \
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
## 2020-05-03 NOTE — ED Triage Notes (Addendum)
To ED for eval of neck pain since 1pm today. Denies injury. States he was seen at cardiologist for a check up - did not feel bad prior. Arm strength equal and strong. Denies HA and no fever. Neck is supple. States pain is in the middle of neck. Movement makes pain worse.
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
## 2020-05-03 NOTE — Progress Notes (Signed)
CSW met with pt during clinic visit to check in.  Pt reports he is doing ok- hasn't heard from referral to [facility] for counseling follow up that CSW had placed but is now reporting he is active with [facility] for mental health services- was supposed to have an appt this week but reports he didn't make it.  CSW called [facility] to schedule new appt but was told he already has one on June 14th at 2:30pm- CSW called pt and made sure he was aware.  Overall pt reports doing ok- is excited to have reconnected with some family and is looking forward to a cooking in a competition next week.  CSW will continue to follow and assist as needed  [name], [facility] Desk#: [phone] Cell#: [phone]

## 2020-05-03 NOTE — Patient Instructions (Signed)
Routine lab work today. Will notify you of abnormal results  Repeat labs in 1 week   INCREASE Torsemide to  40mg  in the AM and 20mg  in the PM  Follow up in 4-6 weeks

## 2020-05-03 NOTE — ED Notes (Signed)
Patient transported to CT 

## 2020-05-10 ENCOUNTER — Other Ambulatory Visit (HOSPITAL_COMMUNITY): Payer: Self-pay

## 2020-05-10 ENCOUNTER — Other Ambulatory Visit: Payer: Self-pay

## 2020-05-10 ENCOUNTER — Ambulatory Visit (HOSPITAL_COMMUNITY)
Admission: RE | Admit: 2020-05-10 | Discharge: 2020-05-10 | Disposition: A | Discharge status: Home / Self Care | Payer: Medicaid Other | Source: Ambulatory Visit | Attending: Cardiology | Admitting: Cardiology

## 2020-05-10 DIAGNOSIS — I5022 Chronic systolic (congestive) heart failure: Secondary | ICD-10-CM | POA: Insufficient documentation

## 2020-05-10 LAB — BASIC METABOLIC PANEL
Anion gap: 12 (ref 5–15)
BUN: 24 mg/dL — ABNORMAL HIGH (ref 6–20)
CO2: 20 mmol/L — ABNORMAL LOW (ref 22–32)
Calcium: 8.7 mg/dL — ABNORMAL LOW (ref 8.9–10.3)
Chloride: 90 mmol/L — ABNORMAL LOW (ref 98–111)
Creatinine, Ser: 1.4 mg/dL — ABNORMAL HIGH (ref 0.61–1.24)
GFR calc Af Amer: >60 mL/min (ref >60)
GFR calc non Af Amer: 54 mL/min — ABNORMAL LOW (ref >60)
Glucose, Bld: 119 mg/dL — ABNORMAL HIGH (ref 70–99)
Potassium: 4 mmol/L (ref 3.5–5.1)
Sodium: 122 mmol/L — ABNORMAL LOW (ref 135–145)

## 2020-05-10 LAB — BRAIN NATRIURETIC PEPTIDE: B Natriuretic Peptide: 745.9 pg/mL — ABNORMAL HIGH (ref 0.0–100.0)
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
## 2020-05-14 ENCOUNTER — Telehealth (HOSPITAL_COMMUNITY): Payer: Self-pay | Admitting: *Deleted

## 2020-05-14 ENCOUNTER — Telehealth: Payer: Self-pay

## 2020-05-14 ENCOUNTER — Telehealth (HOSPITAL_COMMUNITY): Payer: Self-pay

## 2020-05-14 NOTE — Telephone Encounter (Signed)
Spoke to [place] who was made aware of Lab Values from last week and the advice from providers to seek medical attention in the ED due to sodium levels. Seaver reports he feels fine and has been eating better over the last few days and does not want to seek medical attention at present but says his roommate is making him go. I explained to Gennie the risks of refusing treatment and he verbalized agreement and stated he would go. I will continue to follow and plan to see patient at home Thursday. Call complete.

## 2020-05-14 NOTE — Telephone Encounter (Signed)
Pt left VM requesting return call about lab results. I see where chantel called and spoke with pt on 5/21. I called pt back no answer/left vm requesting return call.

## 2020-05-14 NOTE — Telephone Encounter (Signed)
Message received from patient, requesting a call back.     Call returned to patient and message left with call back requested to this CM.

## 2020-05-21 ENCOUNTER — Telehealth (HOSPITAL_COMMUNITY): Payer: Self-pay

## 2020-05-21 NOTE — Telephone Encounter (Signed)
Received detailed message from patient informing me he wants to be removed from paramedicine program and no longer wishes to be followed. I contacted the patient and he continued to stand by his statement reporting he no longer wishes to be followed by paramedicine. Patient was informed we will be removing him from the list and he agreed. Call complete. [facility] staff is aware of same.

## 2020-05-25 IMAGING — DX PORTABLE CHEST - 1 VIEW
1 series · 1 of 1 positions shown · non-contrast
Comparison: 04/16/2019

CLINICAL DATA: Shortness of breath

EXAM:
PORTABLE CHEST 1 VIEW

[chest ap]
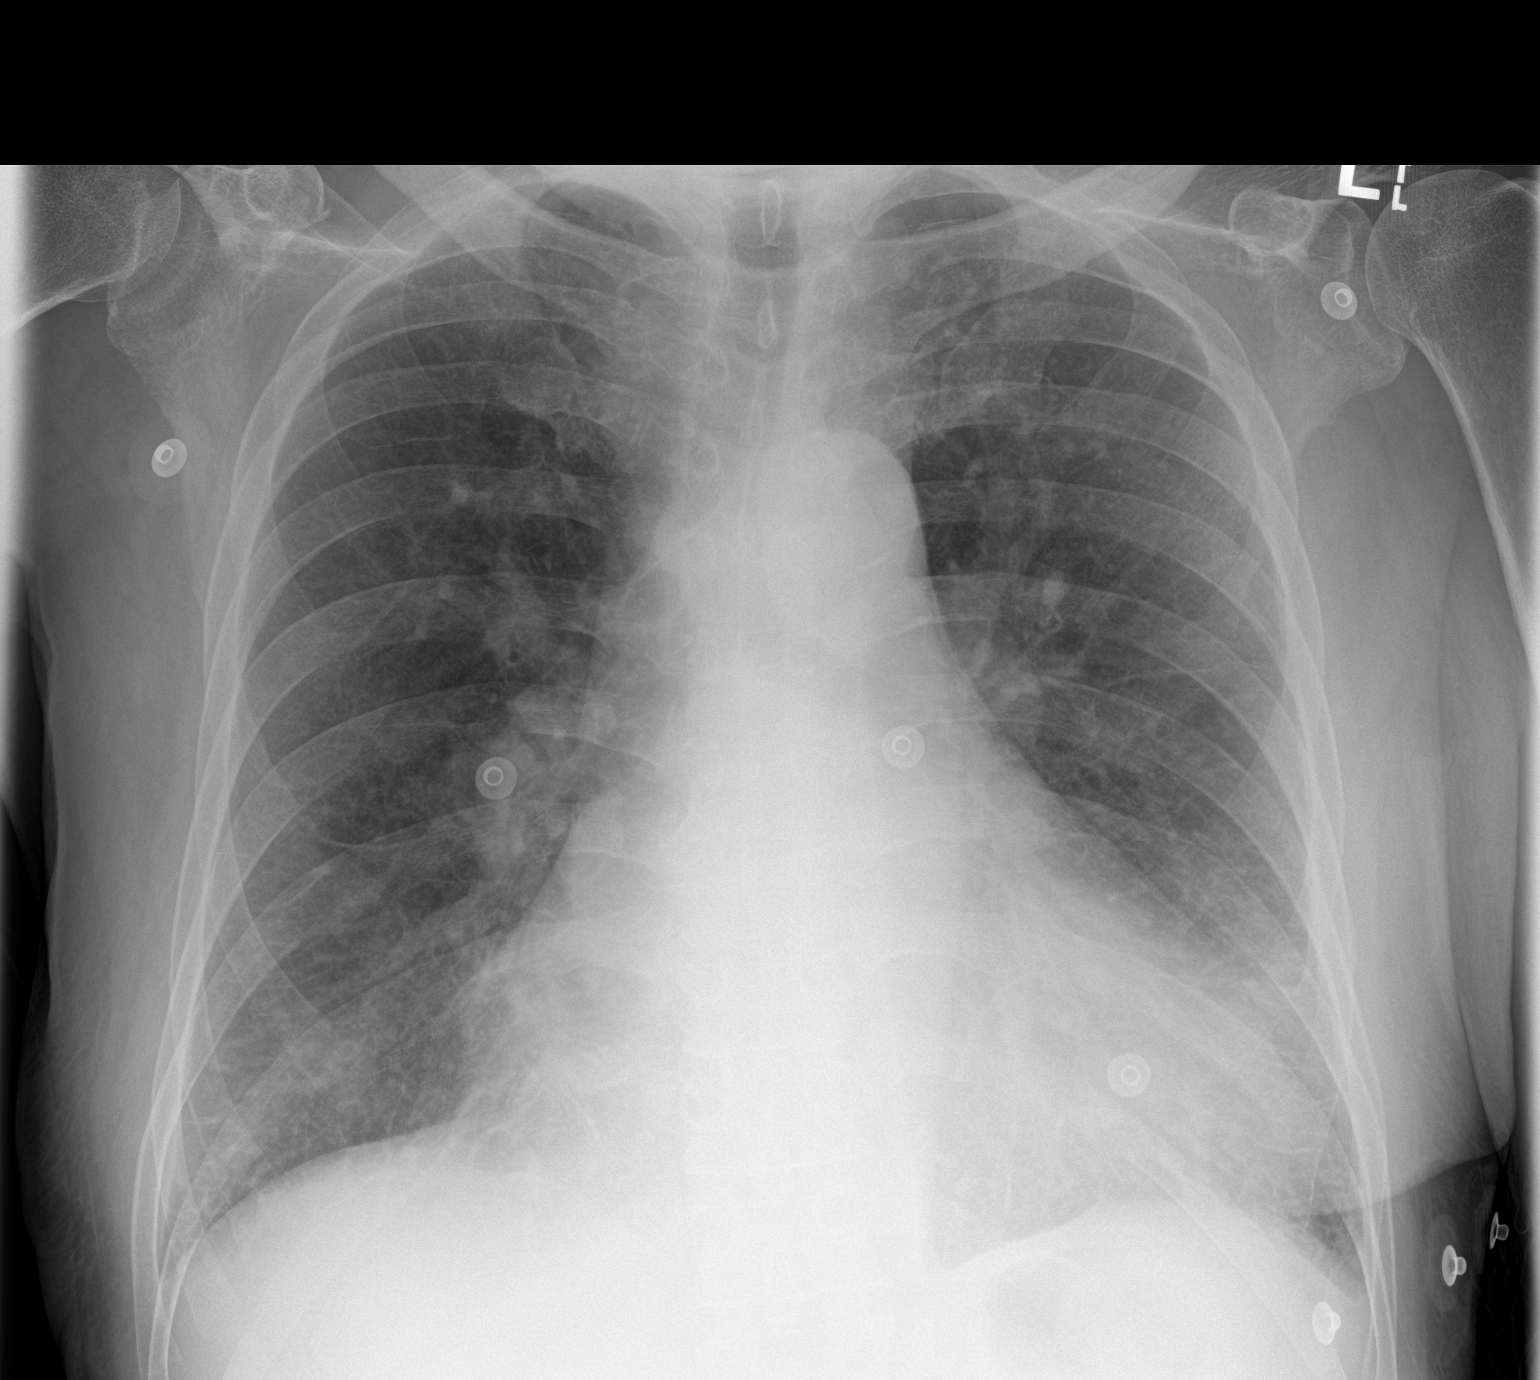

[1 of 1 positions shown; findings below may reference images not displayed]

FINDINGS: Cardiomegaly. Pulmonary venous hypertension with early interstitial
edema. No infiltrate, collapse or effusion. No acute bone finding.
IMPRESSION: Congestive heart failure.  Cardiomegaly and interstitial edema.

## 2020-05-28 ENCOUNTER — Ambulatory Visit: Payer: Medicaid Other | Admitting: Licensed Clinical Social Worker

## 2020-05-29 ENCOUNTER — Encounter (HOSPITAL_COMMUNITY): Payer: Self-pay | Admitting: Emergency Medicine

## 2020-05-29 ENCOUNTER — Emergency Department (HOSPITAL_COMMUNITY): Payer: Medicaid Other

## 2020-05-29 ENCOUNTER — Other Ambulatory Visit: Payer: Self-pay

## 2020-05-29 ENCOUNTER — Inpatient Hospital Stay (HOSPITAL_COMMUNITY)
Admission: EM | Admit: 2020-05-29 | Discharge: 2020-06-05 | DRG: 286 | Disposition: A | Discharge status: Home / Self Care | Payer: Medicaid Other | Attending: Internal Medicine | Admitting: Internal Medicine

## 2020-05-29 DIAGNOSIS — Z96652 Presence of left artificial knee joint: Secondary | ICD-10-CM | POA: Diagnosis present

## 2020-05-29 DIAGNOSIS — N179 Acute kidney failure, unspecified: Secondary | ICD-10-CM | POA: Diagnosis present

## 2020-05-29 DIAGNOSIS — N1831 Chronic kidney disease, stage 3a: Secondary | ICD-10-CM | POA: Diagnosis present

## 2020-05-29 DIAGNOSIS — F1020 Alcohol dependence, uncomplicated: Secondary | ICD-10-CM | POA: Diagnosis present

## 2020-05-29 DIAGNOSIS — F1120 Opioid dependence, uncomplicated: Secondary | ICD-10-CM | POA: Diagnosis present

## 2020-05-29 DIAGNOSIS — I509 Heart failure, unspecified: Secondary | ICD-10-CM

## 2020-05-29 DIAGNOSIS — Z20822 Contact with and (suspected) exposure to covid-19: Secondary | ICD-10-CM | POA: Diagnosis present

## 2020-05-29 DIAGNOSIS — E871 Hypo-osmolality and hyponatremia: Secondary | ICD-10-CM | POA: Diagnosis present

## 2020-05-29 DIAGNOSIS — Z8249 Family history of ischemic heart disease and other diseases of the circulatory system: Secondary | ICD-10-CM

## 2020-05-29 DIAGNOSIS — I130 Hypertensive heart and chronic kidney disease with heart failure and stage 1 through stage 4 chronic kidney disease, or unspecified chronic kidney disease: Secondary | ICD-10-CM | POA: Diagnosis present

## 2020-05-29 DIAGNOSIS — E7800 Pure hypercholesterolemia, unspecified: Secondary | ICD-10-CM | POA: Diagnosis present

## 2020-05-29 DIAGNOSIS — F1411 Cocaine abuse, in remission: Secondary | ICD-10-CM | POA: Diagnosis present

## 2020-05-29 DIAGNOSIS — E785 Hyperlipidemia, unspecified: Secondary | ICD-10-CM | POA: Diagnosis present

## 2020-05-29 DIAGNOSIS — I248 Other forms of acute ischemic heart disease: Secondary | ICD-10-CM | POA: Diagnosis present

## 2020-05-29 DIAGNOSIS — B182 Chronic viral hepatitis C: Secondary | ICD-10-CM | POA: Diagnosis present

## 2020-05-29 DIAGNOSIS — Z794 Long term (current) use of insulin: Secondary | ICD-10-CM | POA: Diagnosis not present

## 2020-05-29 DIAGNOSIS — R296 Repeated falls: Secondary | ICD-10-CM | POA: Diagnosis present

## 2020-05-29 DIAGNOSIS — Z8546 Personal history of malignant neoplasm of prostate: Secondary | ICD-10-CM

## 2020-05-29 DIAGNOSIS — K7460 Unspecified cirrhosis of liver: Secondary | ICD-10-CM | POA: Diagnosis not present

## 2020-05-29 DIAGNOSIS — R0789 Other chest pain: Secondary | ICD-10-CM | POA: Diagnosis present

## 2020-05-29 DIAGNOSIS — I5023 Acute on chronic systolic (congestive) heart failure: Secondary | ICD-10-CM | POA: Diagnosis present

## 2020-05-29 DIAGNOSIS — Z888 Allergy status to other drugs, medicaments and biological substances status: Secondary | ICD-10-CM

## 2020-05-29 DIAGNOSIS — I428 Other cardiomyopathies: Secondary | ICD-10-CM | POA: Diagnosis present

## 2020-05-29 DIAGNOSIS — R251 Tremor, unspecified: Secondary | ICD-10-CM | POA: Diagnosis present

## 2020-05-29 DIAGNOSIS — E1159 Type 2 diabetes mellitus with other circulatory complications: Secondary | ICD-10-CM | POA: Diagnosis not present

## 2020-05-29 DIAGNOSIS — K219 Gastro-esophageal reflux disease without esophagitis: Secondary | ICD-10-CM | POA: Diagnosis present

## 2020-05-29 DIAGNOSIS — F17210 Nicotine dependence, cigarettes, uncomplicated: Secondary | ICD-10-CM | POA: Diagnosis present

## 2020-05-29 DIAGNOSIS — K7030 Alcoholic cirrhosis of liver without ascites: Secondary | ICD-10-CM | POA: Diagnosis present

## 2020-05-29 DIAGNOSIS — E119 Type 2 diabetes mellitus without complications: Secondary | ICD-10-CM

## 2020-05-29 DIAGNOSIS — I2510 Atherosclerotic heart disease of native coronary artery without angina pectoris: Secondary | ICD-10-CM | POA: Diagnosis present

## 2020-05-29 DIAGNOSIS — I5022 Chronic systolic (congestive) heart failure: Secondary | ICD-10-CM | POA: Diagnosis present

## 2020-05-29 DIAGNOSIS — E1122 Type 2 diabetes mellitus with diabetic chronic kidney disease: Secondary | ICD-10-CM | POA: Diagnosis present

## 2020-05-29 DIAGNOSIS — I493 Ventricular premature depolarization: Secondary | ICD-10-CM | POA: Diagnosis present

## 2020-05-29 DIAGNOSIS — Z79899 Other long term (current) drug therapy: Secondary | ICD-10-CM

## 2020-05-29 DIAGNOSIS — I482 Chronic atrial fibrillation, unspecified: Secondary | ICD-10-CM

## 2020-05-29 DIAGNOSIS — F1121 Opioid dependence, in remission: Secondary | ICD-10-CM | POA: Diagnosis present

## 2020-05-29 DIAGNOSIS — Z8673 Personal history of transient ischemic attack (TIA), and cerebral infarction without residual deficits: Secondary | ICD-10-CM

## 2020-05-29 DIAGNOSIS — F329 Major depressive disorder, single episode, unspecified: Secondary | ICD-10-CM | POA: Diagnosis present

## 2020-05-29 DIAGNOSIS — Z9181 History of falling: Secondary | ICD-10-CM

## 2020-05-29 DIAGNOSIS — F1010 Alcohol abuse, uncomplicated: Secondary | ICD-10-CM

## 2020-05-29 DIAGNOSIS — R680 Hypothermia, not associated with low environmental temperature: Secondary | ICD-10-CM | POA: Diagnosis present

## 2020-05-29 DIAGNOSIS — I5043 Acute on chronic combined systolic (congestive) and diastolic (congestive) heart failure: Secondary | ICD-10-CM | POA: Diagnosis not present

## 2020-05-29 DIAGNOSIS — F1911 Other psychoactive substance abuse, in remission: Secondary | ICD-10-CM | POA: Diagnosis present

## 2020-05-29 DIAGNOSIS — I5082 Biventricular heart failure: Secondary | ICD-10-CM | POA: Diagnosis present

## 2020-05-29 DIAGNOSIS — I4821 Permanent atrial fibrillation: Secondary | ICD-10-CM | POA: Diagnosis present

## 2020-05-29 DIAGNOSIS — E1140 Type 2 diabetes mellitus with diabetic neuropathy, unspecified: Secondary | ICD-10-CM | POA: Diagnosis present

## 2020-05-29 DIAGNOSIS — F32A Depression, unspecified: Secondary | ICD-10-CM | POA: Diagnosis present

## 2020-05-29 LAB — CBC
HCT: 33.2 % — ABNORMAL LOW (ref 39.0–52.0)
Hemoglobin: 11.5 g/dL — ABNORMAL LOW (ref 13.0–17.0)
MCH: 30.4 pg (ref 26.0–34.0)
MCHC: 34.6 g/dL (ref 30.0–36.0)
MCV: 87.8 fL (ref 80.0–100.0)
Platelets: 192 10*3/uL (ref 150–400)
RBC: 3.78 MIL/uL — ABNORMAL LOW (ref 4.22–5.81)
RDW: 13.5 % (ref 11.5–15.5)
WBC: 3.7 10*3/uL — ABNORMAL LOW (ref 4.0–10.5)
nRBC: 0 % (ref 0.0–0.2)

## 2020-05-29 LAB — MAGNESIUM: Magnesium: 1.4 mg/dL — ABNORMAL LOW (ref 1.7–2.4)

## 2020-05-29 LAB — RAPID URINE DRUG SCREEN, HOSP PERFORMED
Amphetamines: NOT DETECTED
Barbiturates: NOT DETECTED
Benzodiazepines: NOT DETECTED
Cocaine: NOT DETECTED
Opiates: NOT DETECTED
Tetrahydrocannabinol: NOT DETECTED

## 2020-05-29 LAB — PHOSPHORUS: Phosphorus: 3.8 mg/dL (ref 2.5–4.6)

## 2020-05-29 LAB — BRAIN NATRIURETIC PEPTIDE: B Natriuretic Peptide: 678.3 pg/mL — ABNORMAL HIGH (ref 0.0–100.0)

## 2020-05-29 LAB — GLUCOSE, CAPILLARY
Glucose-Capillary: 145 mg/dL — ABNORMAL HIGH (ref 70–99)
Glucose-Capillary: 149 mg/dL — ABNORMAL HIGH (ref 70–99)
Glucose-Capillary: 146 mg/dL — ABNORMAL HIGH (ref 70–99)
Glucose-Capillary: 127 mg/dL — ABNORMAL HIGH (ref 70–99)

## 2020-05-29 LAB — BASIC METABOLIC PANEL
Anion gap: 12 (ref 5–15)
Anion gap: 12 (ref 5–15)
BUN: 16 mg/dL (ref 6–20)
BUN: 19 mg/dL (ref 6–20)
CO2: 20 mmol/L — ABNORMAL LOW (ref 22–32)
CO2: 23 mmol/L (ref 22–32)
Calcium: 8.1 mg/dL — ABNORMAL LOW (ref 8.9–10.3)
Calcium: 8.1 mg/dL — ABNORMAL LOW (ref 8.9–10.3)
Chloride: 85 mmol/L — ABNORMAL LOW (ref 98–111)
Chloride: 85 mmol/L — ABNORMAL LOW (ref 98–111)
Creatinine, Ser: 1.24 mg/dL (ref 0.61–1.24)
Creatinine, Ser: 1.19 mg/dL (ref 0.61–1.24)
GFR calc Af Amer: >60 mL/min (ref >60)
GFR calc Af Amer: >60 mL/min (ref >60)
GFR calc non Af Amer: >60 mL/min (ref >60)
GFR calc non Af Amer: >60 mL/min (ref >60)
Glucose, Bld: 130 mg/dL — ABNORMAL HIGH (ref 70–99)
Glucose, Bld: 168 mg/dL — ABNORMAL HIGH (ref 70–99)
Potassium: 4.1 mmol/L (ref 3.5–5.1)
Potassium: 3.9 mmol/L (ref 3.5–5.1)
Sodium: 117 mmol/L — CL (ref 135–145)
Sodium: 120 mmol/L — ABNORMAL LOW (ref 135–145)

## 2020-05-29 LAB — OSMOLALITY, URINE: Osmolality, Ur: 138 mOsm/kg — ABNORMAL LOW (ref 300–900)

## 2020-05-29 LAB — HEMOGLOBIN A1C
Hgb A1c MFr Bld: 6.9 % — ABNORMAL HIGH (ref 4.8–5.6)
Mean Plasma Glucose: 151.33 mg/dL

## 2020-05-29 LAB — ETHANOL: Alcohol, Ethyl (B): <10 mg/dL (ref <10)

## 2020-05-29 LAB — SARS CORONAVIRUS 2 BY RT PCR (HOSPITAL ORDER, PERFORMED IN ~~LOC~~ HOSPITAL LAB): SARS Coronavirus 2: NEGATIVE

## 2020-05-29 LAB — SODIUM, URINE, RANDOM: Sodium, Ur: 25 mmol/L

## 2020-05-29 LAB — OSMOLALITY: Osmolality: 252 mOsm/kg — ABNORMAL LOW (ref 275–295)

## 2020-05-29 LAB — TROPONIN I (HIGH SENSITIVITY)
Troponin I (High Sensitivity): 27 ng/L — ABNORMAL HIGH (ref <18)
Troponin I (High Sensitivity): 31 ng/L — ABNORMAL HIGH (ref <18)

## 2020-05-29 IMAGING — CT CT CHEST WITHOUT CONTRAST
2 of 3 series · 15 of 36 positions shown, 18 images · non-contrast
Comparison: 08/16/2018

CLINICAL DATA: Shortness of breath, CHF exacerbation, cirrhosis

EXAM:
CT CHEST WITHOUT CONTRAST
TECHNIQUE: Multidetector CT imaging of the chest was performed following the
standard protocol without IV contrast.

[Series 2: thorax · axial · 0.75mm/px · z∈[+1436,+1716]mm · 12 of 166 slices shown, 15 images]
[im 13/166  mediastinal]
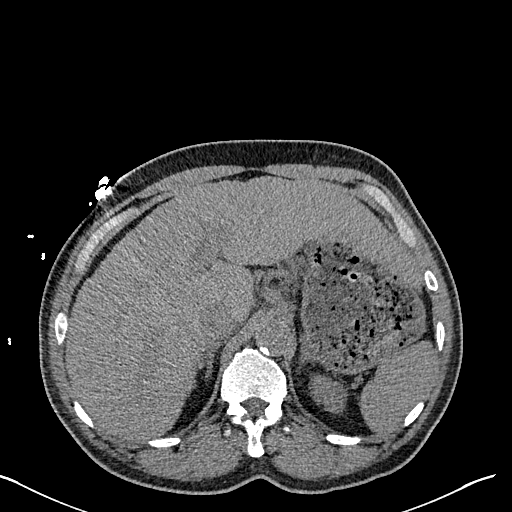
[im 13/166  lung]
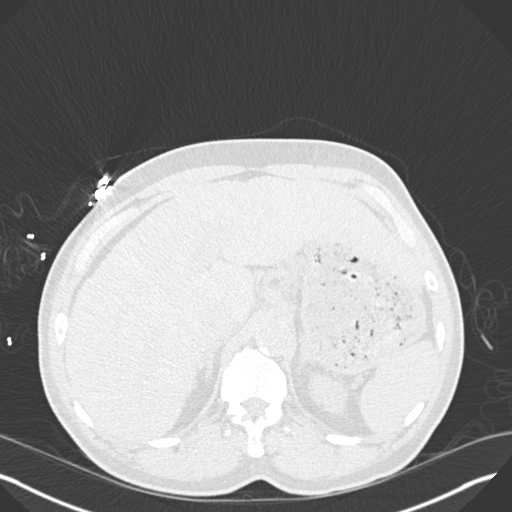
[im 25/166  lung]
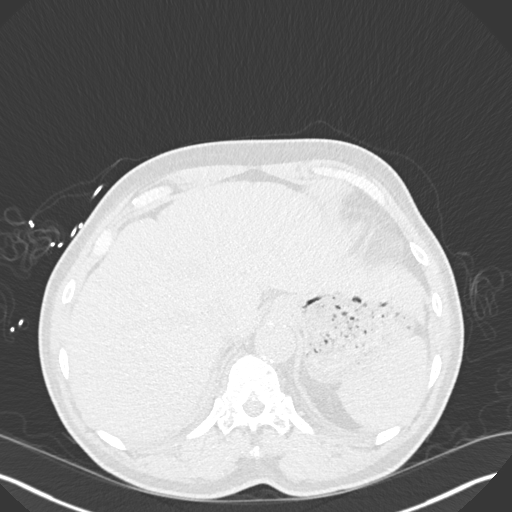
[im 37/166  lung]
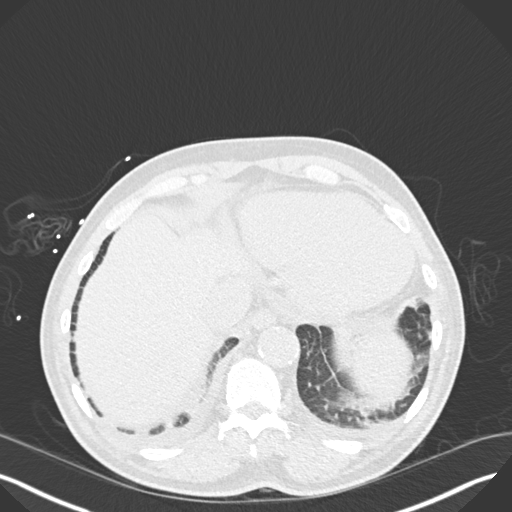
[im 49/166  lung]
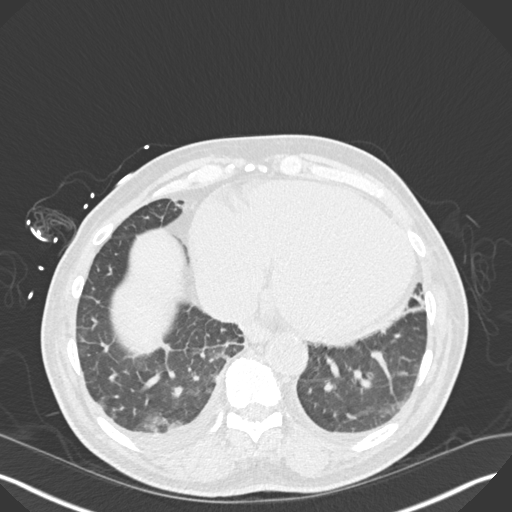
[im 62/166  mediastinal]
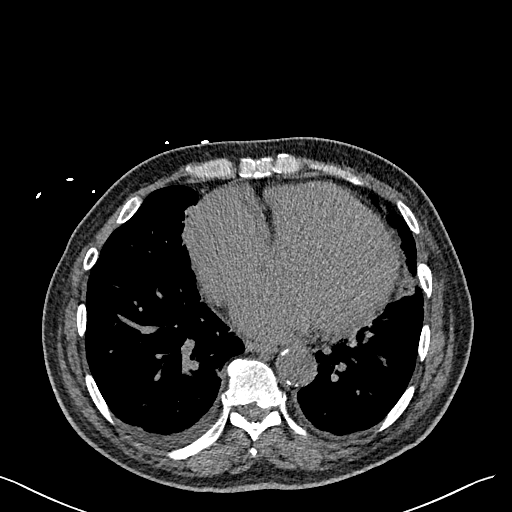
[im 62/166  lung]
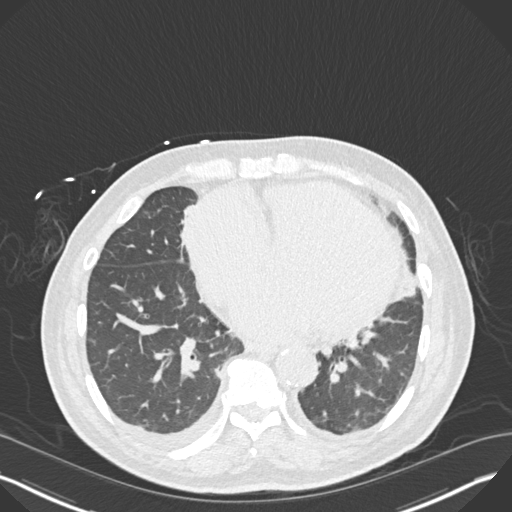
[im 74/166  lung]
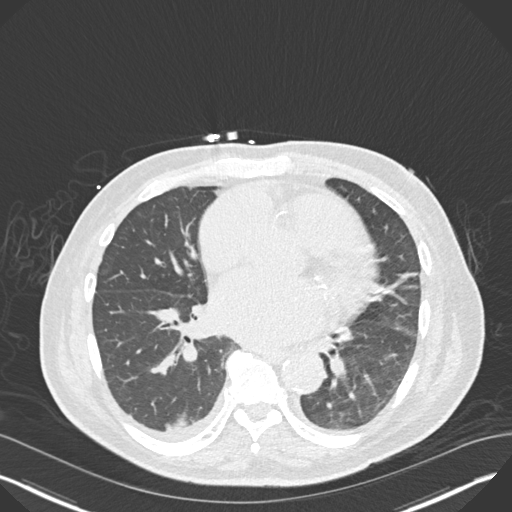
[im 92/166  lung]
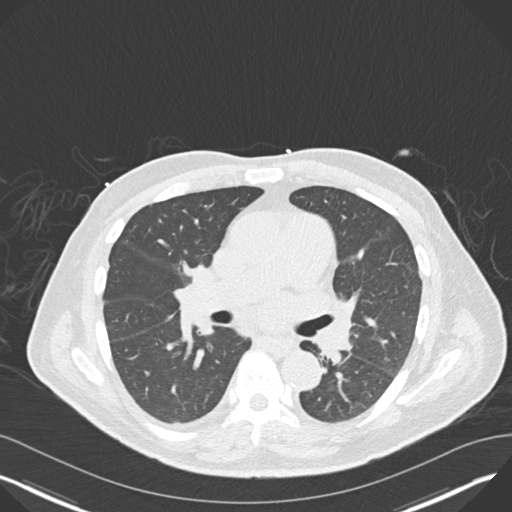
[im 104/166  lung]
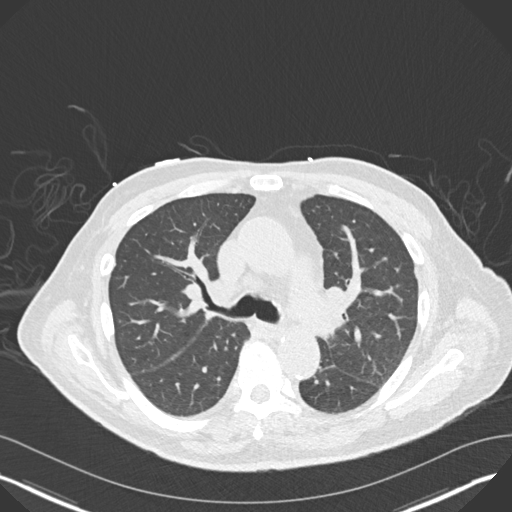
[im 117/166  mediastinal]
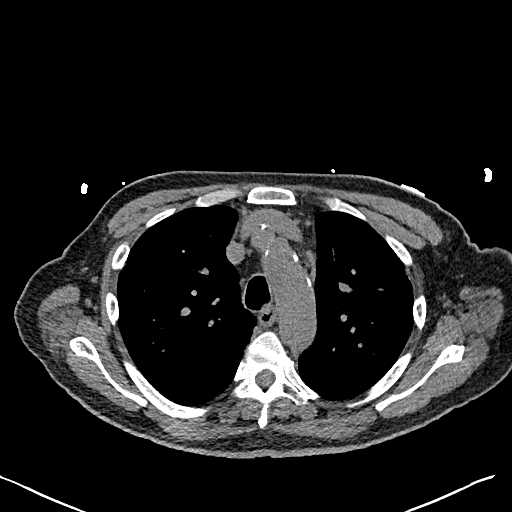
[im 117/166  lung]
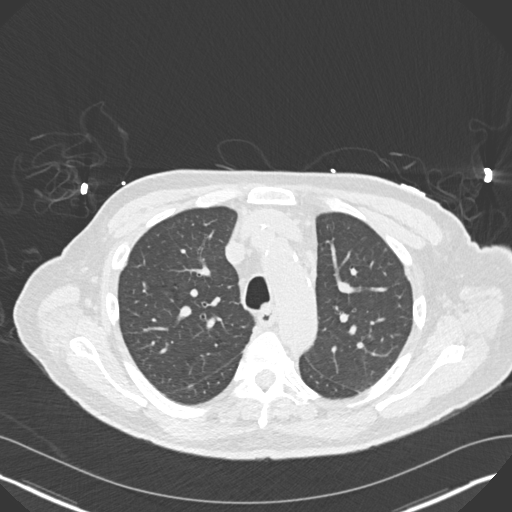
[im 129/166  lung]
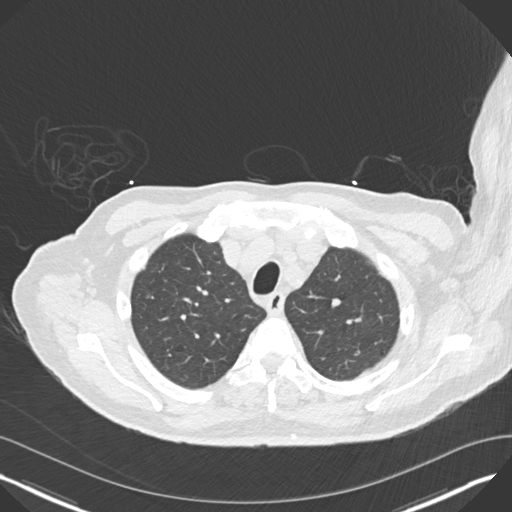
[im 141/166  lung]
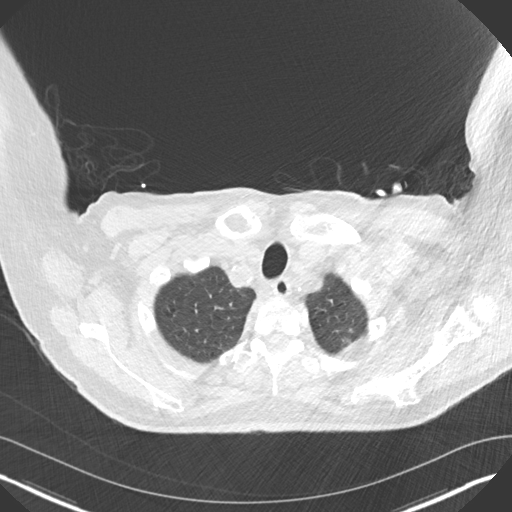
[im 153/166  lung]
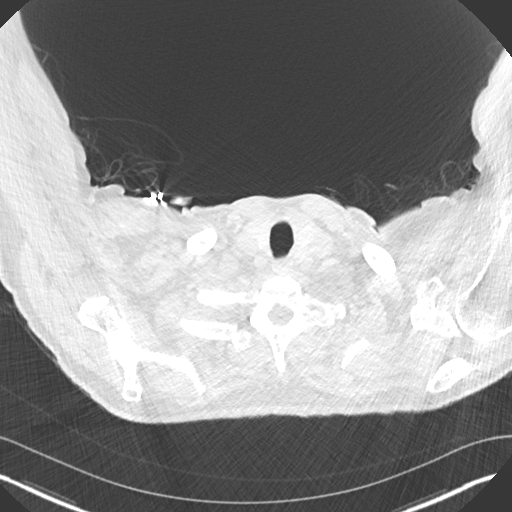

[Series 6: coronal · coronal · 0.65mm/px · 3 of 133 slices shown]
[im 27/133  lung]
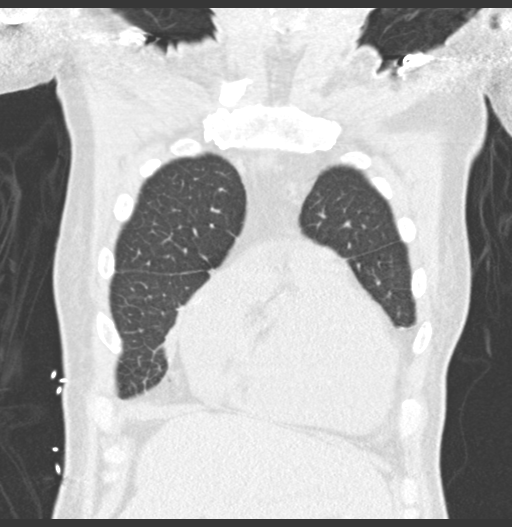
[im 53/133  lung]
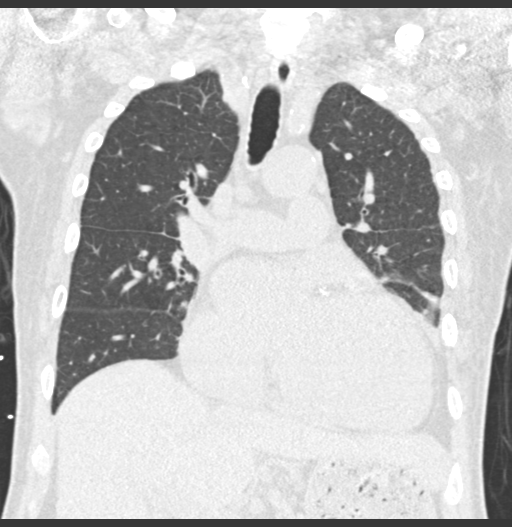
[im 80/133  lung]
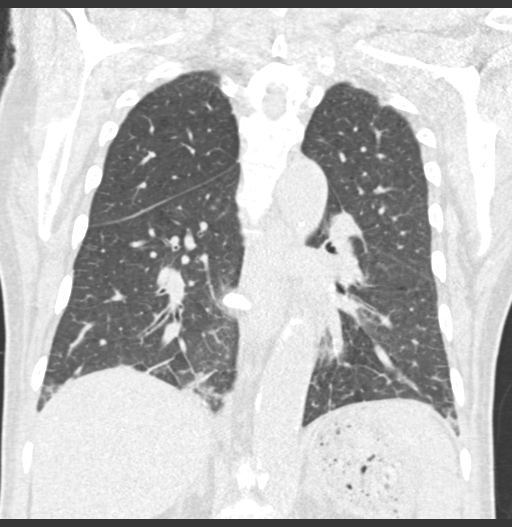

[15 of 36 positions shown; findings below may reference images not displayed]

FINDINGS: Cardiovascular: Aortic atherosclerosis. Cardiomegaly. Three-vessel
coronary artery calcifications. No pericardial effusion.

Mediastinum/Nodes: No enlarged mediastinal, hilar, or axillary lymph
nodes. Thyroid gland, trachea, and esophagus demonstrate no
significant findings.

Lungs/Pleura: Mild centrilobular emphysema. Small bilateral pleural
effusions and associated atelectasis or consolidation. Mild
interlobular septal thickening.

Upper Abdomen: No acute abnormality.  Nodular contour of the liver.

Musculoskeletal: No chest wall mass or suspicious bone lesions
identified.
IMPRESSION: 1. Small bilateral pleural effusions and associated atelectasis or
consolidation. Mild interlobular septal thickening. Findings are
consistent with mild edema.

2.  Cardiomegaly and coronary artery disease.

3.  Mild emphysema.

## 2020-05-29 MED ORDER — MAGNESIUM SULFATE 4 GM/100ML IV SOLN
4.0000 g | Freq: Once | INTRAVENOUS | Status: AC
  Administered 2020-05-29: 4 g via INTRAVENOUS
  Filled 2020-05-29: qty 100

## 2020-05-29 MED ORDER — SPIRONOLACTONE 25 MG PO TABS
25.0000 mg | ORAL_TABLET | Freq: Every day | ORAL | Status: DC
  Administered 2020-05-29: 25 mg via ORAL
  Filled 2020-05-29: qty 1

## 2020-05-29 MED ORDER — FUROSEMIDE 10 MG/ML IJ SOLN
40.0000 mg | INTRAMUSCULAR | Status: AC
  Administered 2020-05-29: 40 mg via INTRAVENOUS
  Filled 2020-05-29: qty 4

## 2020-05-29 MED ORDER — SODIUM CHLORIDE 0.9% FLUSH: 3.0000 mL | Freq: Once | INTRAVENOUS | Status: DC

## 2020-05-29 MED ORDER — LORAZEPAM 2 MG/ML IJ SOLN: 1.0000 mg | INTRAMUSCULAR | Status: DC | PRN

## 2020-05-29 MED ORDER — OXYCODONE HCL 5 MG PO TABS
5.0000 mg | ORAL_TABLET | ORAL | Status: DC | PRN
  Administered 2020-05-29 – 2020-06-05 (×23): 5 mg via ORAL
  Filled 2020-05-29 (×23): qty 1

## 2020-05-29 MED ORDER — DIGOXIN 125 MCG PO TABS
125.0000 ug | ORAL_TABLET | Freq: Every day | ORAL | Status: DC
  Administered 2020-05-29: 125 ug via ORAL
  Filled 2020-05-29: qty 1

## 2020-05-29 MED ORDER — DULOXETINE HCL 60 MG PO CPEP
60.0000 mg | ORAL_CAPSULE | Freq: Every day | ORAL | Status: DC
  Administered 2020-05-29 – 2020-06-05 (×8): 60 mg via ORAL
  Filled 2020-05-29 (×8): qty 1

## 2020-05-29 MED ORDER — INSULIN ASPART 100 UNIT/ML ~~LOC~~ SOLN
0.0000 [IU] | Freq: Three times a day (TID) | SUBCUTANEOUS | Status: DC
  Administered 2020-05-29 (×2): 1 [IU] via SUBCUTANEOUS
  Administered 2020-05-30 (×2): 2 [IU] via SUBCUTANEOUS
  Administered 2020-05-31 (×3): 1 [IU] via SUBCUTANEOUS
  Administered 2020-06-01 (×2): 2 [IU] via SUBCUTANEOUS
  Administered 2020-06-01 – 2020-06-02 (×3): 1 [IU] via SUBCUTANEOUS
  Administered 2020-06-02: 2 [IU] via SUBCUTANEOUS
  Administered 2020-06-03 (×2): 1 [IU] via SUBCUTANEOUS
  Administered 2020-06-04 (×2): 2 [IU] via SUBCUTANEOUS
  Administered 2020-06-05: 1 [IU] via SUBCUTANEOUS

## 2020-05-29 MED ORDER — INSULIN ASPART 100 UNIT/ML ~~LOC~~ SOLN: 0.0000 [IU] | Freq: Every day | SUBCUTANEOUS | Status: DC

## 2020-05-29 MED ORDER — ATORVASTATIN CALCIUM 10 MG PO TABS
20.0000 mg | ORAL_TABLET | Freq: Every day | ORAL | Status: DC
  Administered 2020-05-29 – 2020-06-04 (×7): 20 mg via ORAL
  Filled 2020-05-29 (×2): qty 1
  Filled 2020-05-29 (×5): qty 2

## 2020-05-29 MED ORDER — LORAZEPAM 1 MG PO TABS: 1.0000 mg | ORAL_TABLET | ORAL | Status: DC | PRN

## 2020-05-29 MED ORDER — ALBUTEROL SULFATE (2.5 MG/3ML) 0.083% IN NEBU: 2.5000 mg | INHALATION_SOLUTION | Freq: Four times a day (QID) | RESPIRATORY_TRACT | Status: DC | PRN

## 2020-05-29 MED ORDER — AMIODARONE HCL 200 MG PO TABS
200.0000 mg | ORAL_TABLET | Freq: Every day | ORAL | Status: DC
  Administered 2020-05-29: 200 mg via ORAL
  Filled 2020-05-29: qty 1

## 2020-05-29 MED ORDER — ADULT MULTIVITAMIN W/MINERALS CH
1.0000 | ORAL_TABLET | Freq: Every day | ORAL | Status: DC
  Administered 2020-05-29 – 2020-06-05 (×8): 1 via ORAL
  Filled 2020-05-29 (×7): qty 1

## 2020-05-29 MED ORDER — FUROSEMIDE 10 MG/ML IJ SOLN
40.0000 mg | Freq: Two times a day (BID) | INTRAMUSCULAR | Status: AC
  Administered 2020-05-29 – 2020-05-30 (×3): 40 mg via INTRAVENOUS
  Filled 2020-05-29 (×3): qty 4

## 2020-05-29 MED ORDER — ENOXAPARIN SODIUM 40 MG/0.4ML ~~LOC~~ SOLN
40.0000 mg | SUBCUTANEOUS | Status: DC
  Administered 2020-05-29 – 2020-06-03 (×6): 40 mg via SUBCUTANEOUS
  Filled 2020-05-29 (×7): qty 0.4

## 2020-05-29 MED ORDER — LORAZEPAM 1 MG PO TABS
1.0000 mg | ORAL_TABLET | ORAL | Status: DC | PRN
  Administered 2020-05-29: 1 mg via ORAL
  Filled 2020-05-29: qty 1

## 2020-05-29 MED ORDER — LIDOCAINE 5 % EX PTCH
1.0000 | MEDICATED_PATCH | CUTANEOUS | Status: DC
  Administered 2020-05-29 – 2020-06-02 (×5): 1 via TRANSDERMAL
  Filled 2020-05-29 (×7): qty 1

## 2020-05-29 MED ORDER — FOLIC ACID 1 MG PO TABS
1.0000 mg | ORAL_TABLET | Freq: Every day | ORAL | Status: DC
  Administered 2020-05-29 – 2020-06-05 (×8): 1 mg via ORAL
  Filled 2020-05-29 (×8): qty 1

## 2020-05-29 MED ORDER — GABAPENTIN 300 MG PO CAPS
600.0000 mg | ORAL_CAPSULE | Freq: Every day | ORAL | Status: DC
  Administered 2020-05-29 – 2020-06-04 (×7): 600 mg via ORAL
  Filled 2020-05-29 (×7): qty 2

## 2020-05-29 MED ORDER — TRAMADOL HCL 50 MG PO TABS
50.0000 mg | ORAL_TABLET | Freq: Four times a day (QID) | ORAL | Status: DC | PRN
  Administered 2020-05-29 – 2020-05-31 (×3): 50 mg via ORAL
  Filled 2020-05-29 (×3): qty 1

## 2020-05-29 MED ORDER — THIAMINE HCL 100 MG/ML IJ SOLN
100.0000 mg | Freq: Every day | INTRAMUSCULAR | Status: DC
  Administered 2020-05-29: 100 mg via INTRAVENOUS
  Filled 2020-05-29: qty 2

## 2020-05-29 MED ORDER — ACETAMINOPHEN 325 MG PO TABS
650.0000 mg | ORAL_TABLET | Freq: Four times a day (QID) | ORAL | Status: DC | PRN
  Administered 2020-05-30 (×3): 650 mg via ORAL
  Filled 2020-05-29 (×3): qty 2

## 2020-05-29 MED ORDER — THIAMINE HCL 100 MG PO TABS
100.0000 mg | ORAL_TABLET | Freq: Every day | ORAL | Status: DC
  Administered 2020-05-30 – 2020-06-05 (×7): 100 mg via ORAL
  Filled 2020-05-29 (×8): qty 1

## 2020-05-29 NOTE — Plan of Care (Signed)
  Problem: Education: Goal: Knowledge of General Education information will improve Description: Including pain rating scale, medication(s)/side effects and non-pharmacologic comfort measures Outcome: Progressing   Problem: Pain Managment: Goal: General experience of comfort will improve Outcome: Progressing   

## 2020-05-29 NOTE — Progress Notes (Signed)
PT Cancellation Note  Patient Details Name: Brad [name] MRN: [id number] DOB: 02-Aug-1961   Cancelled Treatment:    Reason Eval/Treat Not Completed: Attempted PT eval-pt declined participation on today. He requested PT check back on tomorrow.   [facility]  Office: [phone] Pager: [phone]

## 2020-05-29 NOTE — ED Notes (Signed)
Date and time results received: 05/29/20 0514 (use smartphrase ".now" to insert current time)  Test: sodium  Critical Value: 117  Name of Provider Notified: Tomi Bamberger, MD   Orders Received? Or Actions Taken?: Orders Received - See Orders for details   Results received by Vikki Ports, [place]

## 2020-05-29 NOTE — Progress Notes (Signed)
PROGRESS NOTE    Brad [name]  ZOX:[id number] DOB: Mar 01, 1961 DOA: 05/29/2020 PCP: Charlott Rakes, MD    Brief Narrative:  Patient admitted to the hospital with working diagnosis of acute on chronic decompensated systolic heart failure.  59 year old male with past medical history for atrial fibrillation, chronic kidney disease, systolic heart failure, hypertension and alcohol abuse.  He reported dyspnea, cough, lower extremity edema and orthopnea for about 24 to 48 hours prior to hospitalization.  On his initial physical examination blood pressure 148/96, heart rate 83, respiratory rate 23, oxygen saturation 88%.  His lungs had bilateral rales, heart S1-S2, present and rhythmic, abdomen soft, positive pitting bilateral lower extremity edema. Sodium 117, potassium 4.1, chloride 85, bicarb 20, glucose 130, BUN 16, creatinine 1.24, BNP 678, troponin I 27, white count 3,7, hemoglobin 11.5, hematocrit 30.4, platelets 192.  SARS COVID-19 negative.  Chest radiograph with cardiomegaly, bilateral hilar vascular congestion.  EKG 75 bpm, rightward axis, normal intervals, sinus rhythm, poor R wave progression, no ST segment or T wave changes, positive PVCs.  Patient has been placed on IV furosemide with improvement of his sodium and volume status.   Assessment & Plan:   Principal Problem:   Acute exacerbation of CHF (congestive heart failure) (HCC) Active Problems:   Heroin addiction ([place])   History of drug abuse ([place])   GERD (gastroesophageal reflux disease)   Depression   Acute hyponatremia   Chronic hepatitis C with cirrhosis ([place])   DM type 2 (diabetes mellitus, type 2) (HCC)   Chronic hyponatremia   Chronic atrial fibrillation   Alcohol abuse   Chronic systolic CHF (congestive heart failure) ([place])   1. Acute decompensated chronic systolic heart failure. (LV EF 35 to 40% 03/2020) Patient with improve symptoms this am, but still not yet back to his baseline. Urine output 1,975 ml since  admission, blood pressure systolic 811 mmHg.   Will continue diuresis with furosemide 40 mg IV q12 H to target negative fluid balance. Will resume digoxin, and continue close follow up on heart rate.   2. Hypervolemic hyponatremia with hypomagnesemia. Serum Na trending up to 120 this am, Mg is 1,4. Patient having intermittent jerking movements of legs and upper extremities with no loss of consciousness.   Will continue with aggressive diuresis with loop diuretics, will hold on spironolactone for now and will correct Mg with 4 g of MAg sulfate, follow on renal function in am.   3. HTN. Continue blood monitoring.  4. Chronic atrial fibrillation. Continue rate control with amiodarone, currently off anticoagulation due to fall risk related to alcohol abuse.   5. CKD stage 3.. Renal function with serum cr at 1,19 with K at 3,9 and serum bicarbonate at 23. Close follow up on renal function and electrolytes.   6. T2DM / dyslipidemia. Continue glucose cover and monitoring with insulin sliding scale.   Continue with atorvastatin.   7. Depression/ alcohol abuse. Continue with duloxetine. Will continue with as needed lorazepam for anxiety.   Status is: Inpatient  Remains inpatient appropriate because:Hemodynamically unstable and Inpatient level of care appropriate due to severity of illness   Dispo: The patient is from: Home              Anticipated d/c is to: Home              Anticipated d/c date is: 1 day              Patient currently is not medically stable to d/c.  DVT prophylaxis: Enoxaparin   Code Status:   full  Family Communication:  No family at the bedside        Subjective: Patient is feeling better but not yet back to baseline, continue to have jerking movements in his extremities, no nausea or vomiting.   Objective: Vitals:   05/29/20 0630 05/29/20 0700 05/29/20 0830 05/29/20 1426  BP: 132/84 138/87 130/75 137/88  Pulse: 80 (!) 57 88 78  Resp: 18 (!) 22 20 17     Temp:   97.8 F (36.6 C) 97.7 F (36.5 C)  TempSrc:   Axillary Oral  SpO2: 98% 97% 96% 100%  Weight:   80.3 kg   Height:   5\' 11"  (1.803 m)     Intake/Output Summary (Last 24 hours) at 05/29/2020 1547 Last data filed at 05/29/2020 1520 Gross per 24 hour  Intake 340 ml  Output 1975 ml  Net -1635 ml   Filed Weights   05/29/20 0330 05/29/20 0830  Weight: 79.4 kg 80.3 kg    Examination:   General: Not in pain or dyspnea, deconditioned  Neurology: Awake and alert, non focal  E ENT: mild pallor, no icterus, oral mucosa moist Cardiovascular: No JVD. S1-S2 present, rhythmic, no gallops, rubs, or murmurs. ++ pitting lower extremity edema. Pulmonary: posiitve breath sounds bilaterally, with no wheezing, rhonchi or rales. Mild decreased breath sounds at bases Gastrointestinal. Abdomen with no organomegaly, non tender, no rebound or guarding Skin. No rashes Musculoskeletal: no joint deformities     Data Reviewed: I have personally reviewed following labs and imaging studies  CBC: Recent Labs  Lab 05/29/20 0432  WBC 3.7*  HGB 11.5*  HCT 33.2*  MCV 87.8  PLT 161   Basic Metabolic Panel: Recent Labs  Lab 05/29/20 0432 05/29/20 0645 05/29/20 0754 05/29/20 0958  NA 117*  --   --  120*  K 4.1  --   --  3.9  CL 85*  --   --  85*  CO2 20*  --   --  23  GLUCOSE 130*  --   --  168*  BUN 16  --   --  19  CREATININE 1.24  --   --  1.19  CALCIUM 8.1*  --   --  8.1*  MG  --   --  1.4*  --   PHOS  --  3.8  --   --    GFR: Estimated Creatinine Clearance: 70.3 mL/min (by C-G formula based on SCr of 1.19 mg/dL). Liver Function Tests: No results for input(s): AST, ALT, ALKPHOS, BILITOT, PROT, ALBUMIN in the last 168 hours. No results for input(s): LIPASE, AMYLASE in the last 168 hours. No results for input(s): AMMONIA in the last 168 hours. Coagulation Profile: No results for input(s): INR, PROTIME in the last 168 hours. Cardiac Enzymes: No results for input(s): CKTOTAL,  CKMB, CKMBINDEX, TROPONINI in the last 168 hours. BNP (last 3 results) No results for input(s): PROBNP in the last 8760 hours. HbA1C: Recent Labs    05/29/20 0755  HGBA1C 6.9*   CBG: Recent Labs  Lab 05/29/20 0823 05/29/20 1205  GLUCAP 145* 149*   Lipid Profile: No results for input(s): CHOL, HDL, LDLCALC, TRIG, CHOLHDL, LDLDIRECT in the last 72 hours. Thyroid Function Tests: No results for input(s): TSH, T4TOTAL, FREET4, T3FREE, THYROIDAB in the last 72 hours. Anemia Panel: No results for input(s): VITAMINB12, FOLATE, FERRITIN, TIBC, IRON, RETICCTPCT in the last 72 hours.    Radiology Studies: I have reviewed  all of the imaging during this hospital visit personally     Scheduled Meds: . amiodarone  200 mg Oral Daily  . atorvastatin  20 mg Oral q1800  . DULoxetine  60 mg Oral Daily  . enoxaparin (LOVENOX) injection  40 mg Subcutaneous Q24H  . folic acid  1 mg Oral Daily  . furosemide  40 mg Intravenous BID  . gabapentin  600 mg Oral QHS  . insulin aspart  0-5 Units Subcutaneous QHS  . insulin aspart  0-9 Units Subcutaneous TID WC  . lidocaine  1 patch Transdermal Q24H  . multivitamin with minerals  1 tablet Oral Daily  . sodium chloride flush  3 mL Intravenous Once  . thiamine  100 mg Oral Daily   Or  . thiamine  100 mg Intravenous Daily   Continuous Infusions: . magnesium sulfate bolus IVPB 4 g (05/29/20 1520)     LOS: 0 days        Synthia Fairbank Gerome Apley, MD
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
## 2020-05-29 NOTE — Evaluation (Signed)
Occupational Therapy Evaluation Patient Details Name: Brad [name] MRN: [id number] DOB: March 19, 1961 Today's Date: 05/29/2020    History of Present Illness  59 y.o. male with a pertinent prior medical history of of ETOH abuse, Afib (not anticoagulated due to falls on EtOH), CKD IIIa, systolic CHF, HTN with multiple admissions this year for chronic hyponatremia, who presenting with shortness of breath and chest pains that woke him up at night on 6/8.   Clinical Impression   Limited evaluation this date as patient states he "doesn't feel right" and is having chest pain, lidocaine patch present. Patient modified I with bed mobility and sat up EOB ~5 minutes without physical assistance. Will follow up for further self care assessment, based on current level of assist needed for bed mobility anticipate HH vs no OT f/u at discharge.     Follow Up Recommendations  Home health OT;No OT follow up;Other (comment);Supervision/Assistance - 24 hour(HH vs no f/u pending pt progress)    Equipment Recommendations  Tub/shower seat       Precautions / Restrictions Precautions Precautions: Fall Restrictions Weight Bearing Restrictions: No      Mobility Bed Mobility Overal bed mobility: Modified Independent                Transfers                 General transfer comment: deferred, patient reports chest is sore "don't feel right, can I do it tomorrow?"    Balance Overall balance assessment: Needs assistance Sitting-balance support: No upper extremity supported;Feet supported Sitting balance-Leahy Scale: Good                                     ADL either performed or assessed with clinical judgement   ADL Overall ADL's : Needs assistance/impaired     Grooming: Set up;Sitting   Upper Body Bathing: Set up;Sitting   Lower Body Bathing: Set up;Sitting/lateral leans;Bed level   Upper Body Dressing : Set up;Sitting   Lower Body Dressing: Set up;Min  guard;Sitting/lateral leans;Bed level     Toilet Transfer Details (indicate cue type and reason): deferred, patient declining standing at this time "I don't feel right" anticipate x1 assist as patient was I with bed mobility                              Pertinent Vitals/Pain Pain Assessment: Faces Faces Pain Scale: Hurts little more Pain Location: chest Pain Descriptors / Indicators: Sore Pain Intervention(s): Monitored during session     Hand Dominance Right   Extremity/Trunk Assessment Upper Extremity Assessment Upper Extremity Assessment: Overall WFL for tasks assessed   Lower Extremity Assessment Lower Extremity Assessment: Defer to PT evaluation       Communication Communication Communication: No difficulties   Cognition Arousal/Alertness: Awake/alert Behavior During Therapy: WFL for tasks assessed/performed Overall Cognitive Status: No family/caregiver present to determine baseline cognitive functioning                                 General Comments: follows 1 step directions appropriately               Home Living Family/patient expects to be discharged to:: Private residence Living Arrangements: Non-relatives/Friends Available Help at Discharge: Friend(s);Available 24 hours/day(per patient) Type of Home: Apartment Home Access: Level  entry     Home Layout: One level     Bathroom Shower/Tub: Teacher, early years/pre: Standard Bathroom Accessibility: Yes How Accessible: Accessible via walker Home Equipment: [place] - 2 wheels;Cane - single point   Additional Comments: patient states has help available 24/7, would not specify who he lives with      Prior Functioning/Environment Level of Independence: Independent with assistive device(s)        Comments: use of single point cane        OT Problem List: Decreased activity tolerance;Impaired balance (sitting and/or standing);Decreased safety awareness;Pain       OT Treatment/Interventions: Self-care/ADL training;Therapeutic exercise;DME and/or AE instruction;Therapeutic activities;Patient/family education;Balance training    OT Goals(Current goals can be found in the care plan section) Acute Rehab OT Goals Patient Stated Goal: "this is all I do at home, sit and watch TV" OT Goal Formulation: With patient Time For Goal Achievement: 06/12/20 Potential to Achieve Goals: Good  OT Frequency: Min 2X/week    AM-PAC OT "6 Clicks" Daily Activity     Outcome Measure Help from another person eating meals?: None Help from another person taking care of personal grooming?: A Little Help from another person toileting, which includes using toliet, bedpan, or urinal?: A Little Help from another person bathing (including washing, rinsing, drying)?: A Little Help from another person to put on and taking off regular upper body clothing?: A Little Help from another person to put on and taking off regular lower body clothing?: A Little 6 Click Score: 19   End of Session    Activity Tolerance: Patient limited by pain;Patient limited by fatigue Patient left: in bed;with call bell/phone within reach;with bed alarm set  OT Visit Diagnosis: Other abnormalities of gait and mobility (R26.89);Pain Pain - part of body: (chest)                Time: 7530-0511 OT Time Calculation (min): 14 min Charges:  OT General Charges $OT Visit: 1 Visit OT Evaluation $OT Eval Low Complexity: [place] OT Pager: [phone]  Rosemary Holms 05/29/2020, 2:12 PM

## 2020-05-29 NOTE — ED Triage Notes (Signed)
Patient brought from home by [facility]. Patient is complaining of chest pain and sob while sleeping an hour ago. Patient has a hx of chf and afib. Abdomen distended and leg are edematous. Patient has had 325 mg of Asprin by EMS.

## 2020-05-29 NOTE — Progress Notes (Signed)
PROGRESS NOTE  Brad [name] OJJ:[id number] DOB: 1961-11-01 DOA: 05/29/2020 PCP: Charlott Rakes, MD   LOS: 0 days   Brief Narrative / Interim history: Brad [name] is a 59 y.o. male with a pertinent prior medical history of of ETOH abuse, Afib (not anticoagulated due to falls on EtOH), CKD IIIa, systolic CHF, HTN with multiple admissions this yearfor chronic hyponatremia, who presenting with shortness of breath and chest pains that woke him up at night on 6/8.  Chest pain has been constant and located over sternum only without radiation, worse with inspiration, and acutely tender to palpation. Pain woke him from sleep. Reports a non-productive cough for last 24 hours. Reports compliance with low sodium diet and fluid restriction and with all meds. Has noticed worsening swelling of his legs over last few days; has not weighed himself. Denies headache, dizziness, fever, chills, abdominal pain, nausea, vomiting, diarrhea, constipation, dysuria, hematuria, hematochezia, melena, difficulty moving arms/legs, speech difficulty, trouble eating, confusion or any other complaints.  In the ED: Hypertensive otherwise vitals stable on room air. Labs remarkable for BNP 678, Na 117, Cl 85, Cr 1.24, WBC 3.7, Hgb 11.5, Trop 37>31.CXR: Findings suggestive of mild CHF/volume overload with borderline cardiomegaly and interstitial edema.  Patient was given 40 mg IV Lasix and admission requested for CHF exacerbation and hyponatremia.   Subjective / 24h Interval events: Patient was resting in bed eating sherbet on morning rounds. He appeared mildy agitated due to pain over his sternum, and upset due to lack of pain control until now. Patient is also frustrated due to "shaking" of his right leg that started this morning, "it's annoying". He denies priors of leg tremors, alcohol withdrawals, and stated "I drink every day, one beer only". I assured patient that we will be providing pain relief for his chest wall  pain without delay. Patient voiced understanding without any further questions.   Assessment & Plan: Principal Problem CHF exacerbation with Acute on Chronic Systolic Heart Failure (LVEF 35-40%): - BNP 678, high sensitivity troponin 27, however chronically elevated (baseline 31-37) - CXR on 6/8 suggestive of mild CHF/volume overload with borderline cardiomegaly and interstitial edema. - Echo on 4/21 shows improved function from prior echo, and moderately reduced RV fxn. - Echo on 8/20 shows LVEF 25-30%.  - NYHA Class II-III. Nonischemic cardiomyopathy, probably due to substance abuse (ETOH, prior cocaine). - Volume statusup after diuretic dose reduction due to chronic hyponatremia (baseline 122-130). - Hold home torsemide. On Lasix 40 mg IV BID. - Continue spironolactone 25 mg daily  - no longer on Coreg due to recent bradycardia - No ARB/ARNI or digoxin with CKD. - Daily weights. 05/29/20: 79.4 kg. 05/10/20: 79.4 kg. 05/03/20: 81.8kg. - I's and O's. -1.5L since admission. - Mag and K daily  - Cardiac monitoring. Obtain EKG if new onset chest pains. - Diuresis may be further complicated by hyponatremia  - will not give low Na diet due to hyponatremia - 1.5L fluid restriction   Active Problems Acute on Chronic Hyponatremia - Na 117 on admission with baseline around 122-130. - Urine osmolality 138, urine NA 25. UDS clear. - Chronic hyponatremia likely secondary tolarge po intake (beer at home= beer potamania). - Previously requiredtolvaptan. - BMP q6 today, then daily BMP  Chest pain, reproducible  - constant pain and very tender to palpation - Lidocaine patch q12 for comfort. - Trops stable, per baseline.  Essential hypertension - On amiodarone - Hold digoxin with CKD.stage IIIa  Hyperlipidemia - Continue atorvastatin 20 mg daily  Chronic afib - Per Cards: no longer on Eliquis given heavy ETOH use and multiple falls. Can revisit restarting Eliquis in the future if  hecanrefrain for heavy ETOH use. - On amiodarone.  ETOH dependence with cirrhosis / Hepatitis C: - CIWA protocol - SW consult   Stage IIIa CKD - Cr 1.24, at baseline;  - Monitor - On 1.5L fluid restrictions as per above CHF exacerbation.   Diabetes mellitus type 2 - A1c 6.8 on 3/4 - Sliding scale insulin   Scheduled Meds: . amiodarone  200 mg Oral Daily  . atorvastatin  20 mg Oral q1800  . DULoxetine  60 mg Oral Daily  . enoxaparin (LOVENOX) injection  40 mg Subcutaneous Q24H  . folic acid  1 mg Oral Daily  . furosemide  40 mg Intravenous BID  . gabapentin  600 mg Oral QHS  . insulin aspart  0-5 Units Subcutaneous QHS  . insulin aspart  0-9 Units Subcutaneous TID WC  . lidocaine  1 patch Transdermal Q24H  . multivitamin with minerals  1 tablet Oral Daily  . sodium chloride flush  3 mL Intravenous Once  . spironolactone  25 mg Oral Daily  . thiamine  100 mg Oral Daily   Or  . thiamine  100 mg Intravenous Daily   Continuous Infusions: PRN Meds:.acetaminophen, albuterol, LORazepam **OR** LORazepam, oxyCODONE, traMADol  DVT prophylaxis: Lovenox  Code Status: Full code Family Communication: no family at bedside on rounds.  Status is: Inpatient  Remains inpatient appropriate because:Inpatient level of care appropriate due to severity of illness   Dispo: The patient is from: Home              Anticipated d/c is to: Home              Anticipated d/c date is: 2 days              Patient currently is not medically stable to d/c.   Consultants:  None  Procedures:  2D echo: None Foley: None BiPAP: None HD: None  Microbiology  Covid negative.  Antimicrobials: None    Objective: Vitals:   05/29/20 0530 05/29/20 0600 05/29/20 0630 05/29/20 0700  BP: 130/74 (!) 139/95 132/84 138/87  Pulse: 78 78 80 (!) 57  Resp: (!) 23 (!) 23 18 (!) 22  Temp:      TempSrc:      SpO2: 96% 95% 98% 97%  Weight:      Height:        Intake/Output Summary (Last 24  hours) at 05/29/2020 0941 Last data filed at 05/29/2020 0744 Gross per 24 hour  Intake --  Output 1500 ml  Net -1500 ml   Filed Weights   05/29/20 0330  Weight: 79.4 kg    Examination:  Constitutional: NAD Eyes: no scleral icterus ENMT: Mucous membranes are moist.  Neck: normal, supple Respiratory: bibasilar crackles, otherwise clear to auscultation bilaterally, no wheezing. Normal respiratory effort. No accessory muscle use.  Cardiovascular: irregular rhythm, regular rate, systolic murmur appreciated. 2+ pitting LE edema. Severe TTP over sternum. Abdomen: non distended, no tenderness. Musculoskeletal: no clubbing / cyanosis. Tremor in BLE, worse on right leg. Skin: no rashes Neurologic: grossly non-focal Psychiatric: Normal judgment and insight. Alert and oriented x 3. Normal mood.    Data Reviewed: I have independently reviewed following labs and imaging studies   CBC: Recent Labs  Lab 05/29/20 0432  WBC 3.7*  HGB 11.5*  HCT 33.2*  MCV 87.8  PLT 192  Basic Metabolic Panel: Recent Labs  Lab 05/29/20 0432 05/29/20 0645 05/29/20 0754  NA 117*  --   --   K 4.1  --   --   CL 85*  --   --   CO2 20*  --   --   GLUCOSE 130*  --   --   BUN 16  --   --   CREATININE 1.24  --   --   CALCIUM 8.1*  --   --   MG  --   --  1.4*  PHOS  --  3.8  --    Liver Function Tests: No results for input(s): AST, ALT, ALKPHOS, BILITOT, PROT, ALBUMIN in the last 168 hours. Coagulation Profile: No results for input(s): INR, PROTIME in the last 168 hours. HbA1C: No results for input(s): HGBA1C in the last 72 hours. CBG: Recent Labs  Lab 05/29/20 0823  GLUCAP 145*    Recent Results (from the past 240 hour(s))  SARS Coronavirus 2 by RT PCR (hospital order, performed in [facility] hospital lab) Nasopharyngeal Nasopharyngeal Swab     Status: None   Collection Time: 05/29/20  5:29 AM   Specimen: Nasopharyngeal Swab  Result Value Ref Range Status   SARS Coronavirus 2 NEGATIVE  NEGATIVE Final    Comment: (NOTE) SARS-CoV-2 target nucleic acids are NOT DETECTED. The SARS-CoV-2 RNA is generally detectable in upper and lower respiratory specimens during the acute phase of infection. The lowest concentration of SARS-CoV-2 viral copies this assay can detect is 250 copies / mL. A negative result does not preclude SARS-CoV-2 infection and should not be used as the sole basis for treatment or other patient management decisions.  A negative result may occur with improper specimen collection / handling, submission of specimen other than nasopharyngeal swab, presence of viral mutation(s) within the areas targeted by this assay, and inadequate number of viral copies (<250 copies / mL). A negative result must be combined with clinical observations, patient history, and epidemiological information. Fact Sheet for Patients:   StrictlyIdeas.no Fact Sheet for Healthcare Providers: BankingDealers.co.za This test is not yet approved or cleared  by the [place] FDA and has been authorized for detection and/or diagnosis of SARS-CoV-2 by FDA under an Emergency Use Authorization (EUA).  This EUA will remain in effect (meaning this test can be used) for the duration of the COVID-19 declaration under Section 564(b)(1) of the Act, 21 U.S.C. section 360bbb-3(b)(1), unless the authorization is terminated or revoked sooner. Performed at [facility], [place] [address] [place], [place] [address]      Radiology Studies: DG Chest 2 View  Result Date: 05/29/2020 CLINICAL DATA:  Chest pain, CHF, AFib EXAM: CHEST - 2 VIEW COMPARISON:  Radiograph 04/18/2020 FINDINGS: Some increasing hazy interstitial opacities with redistributed pulmonary vascularity and central peribronchovascular cuffing as well as some mild fissural and septal thickening. No focal consolidation. No pneumothorax or visible effusion. Borderline cardiomegaly.  The aorta is calcified. The remaining cardiomediastinal contours are unremarkable. No acute osseous or soft tissue abnormality. IMPRESSION: Findings suggestive of mild CHF/volume overload with borderline cardiomegaly and interstitial edema. Electronically Signed   By: Lovena Le M.D.   On: 05/29/2020 03:26

## 2020-05-30 LAB — BASIC METABOLIC PANEL
Anion gap: 11 (ref 5–15)
BUN: 25 mg/dL — ABNORMAL HIGH (ref 6–20)
CO2: 21 mmol/L — ABNORMAL LOW (ref 22–32)
Calcium: 8.1 mg/dL — ABNORMAL LOW (ref 8.9–10.3)
Chloride: 88 mmol/L — ABNORMAL LOW (ref 98–111)
Creatinine, Ser: 1.24 mg/dL (ref 0.61–1.24)
GFR calc Af Amer: >60 mL/min (ref >60)
GFR calc non Af Amer: >60 mL/min (ref >60)
Glucose, Bld: 116 mg/dL — ABNORMAL HIGH (ref 70–99)
Potassium: 4.2 mmol/L (ref 3.5–5.1)
Sodium: 120 mmol/L — ABNORMAL LOW (ref 135–145)

## 2020-05-30 LAB — GLUCOSE, CAPILLARY
Glucose-Capillary: 184 mg/dL — ABNORMAL HIGH (ref 70–99)
Glucose-Capillary: 105 mg/dL — ABNORMAL HIGH (ref 70–99)
Glucose-Capillary: 178 mg/dL — ABNORMAL HIGH (ref 70–99)
Glucose-Capillary: 123 mg/dL — ABNORMAL HIGH (ref 70–99)

## 2020-05-30 LAB — MAGNESIUM: Magnesium: 2.5 mg/dL — ABNORMAL HIGH (ref 1.7–2.4)

## 2020-05-30 LAB — DIGOXIN LEVEL: Digoxin Level: <0.2 ng/mL — ABNORMAL LOW (ref 0.8–2.0)

## 2020-05-30 MED ORDER — SPIRONOLACTONE 25 MG PO TABS
25.0000 mg | ORAL_TABLET | Freq: Every day | ORAL | Status: DC
  Administered 2020-05-30 – 2020-06-05 (×7): 25 mg via ORAL
  Filled 2020-05-30 (×7): qty 1

## 2020-05-30 NOTE — Progress Notes (Signed)
   05/30/20 0900  Assess: MEWS Score  BP 112/84  Pulse Rate (!) 38  SpO2 100 %  Assess: MEWS Score  MEWS Temp 0  MEWS Systolic 0  MEWS Pulse 2  MEWS RR 0  MEWS LOC 0  MEWS Score 2  MEWS Score Color Yellow  Assess: if the MEWS score is Yellow or Red  Were vital signs taken at a resting state? Yes  Focused Assessment Documented focused assessment  Early Detection of Sepsis Score *See Row Information* Medium  MEWS guidelines implemented *See Row Information* No, previously yellow, continue vital signs every 4 hours  Treat  MEWS Interventions Escalated (See documentation below)  Take Vital Signs  Increase Vital Sign Frequency  Yellow: Q 2hr X 2 then Q 4hr X 2, if remains yellow, continue Q 4hrs  Escalate  MEWS: Escalate Yellow: discuss with charge nurse/RN and consider discussing with provider and RRT  Notify: Charge Nurse/RN  Name of Charge Nurse/RN Notified Megan RN   Date Charge Nurse/RN Notified 05/30/20  Time Charge Nurse/RN Notified 0900  Notify: Provider  Provider Name/Title Bonner Puna   Date Provider Notified 05/30/20  Time Provider Notified [phone]  Notification Type Call  Notification Reason Change in status  Response See new orders  Date of Provider Response 05/30/20  Time of Provider Response 1000  Notify: Rapid Response  Name of Rapid Response RN Notified Zoe RN   Date Rapid Response Notified 05/30/20  Time Rapid Response Notified 0900  Document  Patient Outcome Stabilized after interventions  Progress note created (see row info) Yes

## 2020-05-30 NOTE — Progress Notes (Signed)
Occupational Therapy Treatment Patient Details Name: Brad [name] MRN: [id number] DOB: [birth year] Today's Date: 05/30/2020    History of present illness  59 y.o. male with a pertinent prior medical history of of ETOH abuse, Afib (not anticoagulated due to falls on EtOH), CKD IIIa, systolic CHF, HTN with multiple admissions this year for chronic hyponatremia, who presenting with shortness of breath and chest pains that woke him up at night on 6/8.   OT comments  Patient agreeable to OOB mobility today. Voicing frustrations at beginning of session regarding changes in his medications, and not having ice due to low temperature taken earlier today. Also per patient states he got up himself and walked into bathroom with IV pole. Advise patient that due to jerking movements in extremities with therapy to use call light and have assistance in room for safety. Patient min A for safety during functional transfers/ambulation this date. Initially try cane as patient uses this at baseline, however decreased stability therefore transitioned to walker. Will continue to follow.    Follow Up Recommendations  No OT follow up    Equipment Recommendations  Tub/shower seat       Precautions / Restrictions Precautions Precautions: Fall Restrictions Weight Bearing Restrictions: No       Mobility Bed Mobility Overal bed mobility: Modified Independent                Transfers Overall transfer level: Needs assistance Equipment used: Rolling walker (2 wheeled);Straight cane Transfers: Sit to/from Stand Sit to Stand: Min assist         General transfer comment: min A for safety as patient does have mild jerking movements in extremities    Balance Overall balance assessment: Needs assistance Sitting-balance support: No upper extremity supported;Feet supported Sitting balance-Leahy Scale: Good     Standing balance support: Single extremity supported Standing balance-Leahy Scale:  Poor Standing balance comment: decreased stability with only cane, improved with RW                           ADL either performed or assessed with clinical judgement   ADL Overall ADL's : Needs assistance/impaired     Grooming: Wash/dry face;Set up;Sitting                   Toilet Transfer: Minimal assistance;RW;Ambulation Toilet Transfer Details (indicate cue type and reason): min A for safety as patient does have mild jerking movements in extremities                           Cognition Arousal/Alertness: Awake/alert Behavior During Therapy: WFL for tasks assessed/performed Overall Cognitive Status: No family/caregiver present to determine baseline cognitive functioning                                 General Comments: somewhat agitated about his medication                   Pertinent Vitals/ Pain       Pain Assessment: Faces Faces Pain Scale: No hurt  Home Living Family/patient expects to be discharged to:: Private residence Living Arrangements: Non-relatives/Friends Available Help at Discharge: Friend(s);Available 24 hours/day Type of Home: Apartment Home Access: Level entry     Home Layout: One level     Bathroom Shower/Tub: Teacher, early years/pre: Standard Bathroom Accessibility: Yes  Home Equipment: Gracey - 2 wheels;Cane - single point   Additional Comments: patient states has help available 24/7, would not specify who he lives with      Prior Functioning/Environment Level of Independence: Independent with assistive device(s)        Comments: use of single point cane   Frequency  Min 2X/week        Progress Toward Goals  OT Goals(current goals can now be found in the care plan section)  Progress towards OT goals: Progressing toward goals  Acute Rehab OT Goals Patient Stated Goal: to go home, OT Goal Formulation: With patient Time For Goal Achievement: 06/12/20 Potential to  Achieve Goals: Good ADL Goals Pt Will Perform Grooming: with modified independence;standing Pt Will Perform Upper Body Dressing: Independently;sitting Pt Will Perform Lower Body Dressing: with modified independence;sit to/from stand;sitting/lateral leans Pt Will Transfer to Toilet: with modified independence;ambulating  Plan Discharge plan remains appropriate    Co-evaluation    PT/OT/SLP Co-Evaluation/Treatment: Yes Reason for Co-Treatment: For patient/therapist safety;To address functional/ADL transfers   OT goals addressed during session: ADL's and self-care      AM-PAC OT "6 Clicks" Daily Activity     Outcome Measure   Help from another person eating meals?: None Help from another person taking care of personal grooming?: A Little Help from another person toileting, which includes using toliet, bedpan, or urinal?: A Little Help from another person bathing (including washing, rinsing, drying)?: A Little Help from another person to put on and taking off regular upper body clothing?: A Little Help from another person to put on and taking off regular lower body clothing?: A Little 6 Click Score: 19    End of Session Equipment Utilized During Treatment: Rolling walker;Gait belt  OT Visit Diagnosis: Other abnormalities of gait and mobility (R26.89)   Activity Tolerance Patient tolerated treatment well   Patient Left in chair;with call bell/phone within reach;with chair alarm set   Nurse Communication Mobility status        Time: 9371-6967 OT Time Calculation (min): 25 min  Charges: OT General Charges $OT Visit: 1 Visit OT Treatments $Self Care/Home Management : 8-22 mins  Delbert Phenix OT Pager: [place] 05/30/2020, 3:18 PM
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
## 2020-05-30 NOTE — Progress Notes (Signed)
CCMD notified this RN that patient HR dropped to the 30s non-sustained. Pt vitals, Temp 96.1 (rectal), HR 38 (afib), BP 112/84, O2 100 on RA. Pt drowsy, but alert to voice and AxOx4. MD Bonner Puna notified. EKG complete and placed in chart. Warm blankets and heat packs applied to patient. Rapid response nurse notified.   Verbal orders given to hold scheduled digoxin and amiodarone. See new orders.    Will continue to monitor.

## 2020-05-30 NOTE — Plan of Care (Signed)
  Problem: Education: Goal: Knowledge of General Education information will improve Description: Including pain rating scale, medication(s)/side effects and non-pharmacologic comfort measures Outcome: Progressing   Problem: Activity: Goal: Risk for activity intolerance will decrease Outcome: Progressing   Problem: Nutrition: Goal: Adequate nutrition will be maintained Outcome: Progressing   Problem: Elimination: Goal: Will not experience complications related to urinary retention Outcome: Progressing   
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
## 2020-05-30 NOTE — Evaluation (Addendum)
Physical Therapy Evaluation Patient Details Name: Brad [name] MRN: [id number] DOB: September 17, 1961 Today's Date: 05/30/2020   History of Present Illness   59 y.o. male with a pertinent prior medical history of of ETOH abuse, Afib (not anticoagulated due to falls on EtOH), CKD IIIa, systolic CHF, HTN with multiple admissions this year for chronic hyponatremia, who presenting with shortness of breath and chest pains that woke him up at night on 6/8.  Clinical Impression  The patient  Initially expressing being upset about his medications. Patient with noted jerking of the legs intermittently at rest and while ambulating. Sodium noted 120. Patient lives with afriend and plans to return home. Pt admitted with above diagnosis.  Pt currently with functional limitations due to the deficits listed below (see PT Problem List). Pt will benefit from skilled PT to increase their independence and safety with mobility to allow discharge to the venue listed below.    Initially stood with cane, felt unsteady  So provided a Rw with improved stability with the jerking movements of the legs.     Follow Up Recommendations No PT follow up    Equipment Recommendations  None recommended by PT    Recommendations for Other Services       Precautions / Restrictions Precautions Precautions: Fall      Mobility  Bed Mobility Overal bed mobility: Modified Independent                Transfers Overall transfer level: Needs assistance Equipment used: Straight cane                Ambulation/Gait Ambulation/Gait assistance: Min assist Gait Distance (Feet): 125 Feet Assistive device: Rolling walker (2 wheeled) Gait Pattern/deviations: Step-through pattern     General Gait Details: noted  jerking of legs while ambulating.  Stairs            Wheelchair Mobility    Modified [name] (Stroke Patients Only)       Balance Overall balance assessment: Needs assistance   Sitting  balance-Leahy Scale: Good     Standing balance support: Single extremity supported Standing balance-Leahy Scale: Poor Standing balance comment: decreased stability with only cane, improved with RW                             Pertinent Vitals/Pain Pain Assessment: No/denies pain    Home Living Family/patient expects to be discharged to:: Private residence Living Arrangements: Non-relatives/Friends Available Help at Discharge: Friend(s);Available 24 hours/day Type of Home: Apartment Home Access: Level entry     Home Layout: One level Home Equipment: Walker - 2 wheels;Cane - single point Additional Comments: patient states has help available 24/7, would not specify who he lives with    Prior Function Level of Independence: Independent with assistive device(s)         Comments: use of single point cane     Hand Dominance   Dominant Hand: Right    Extremity/Trunk Assessment   Upper Extremity Assessment Upper Extremity Assessment: Overall WFL for tasks assessed    Lower Extremity Assessment Lower Extremity Assessment: RLE deficits/detail;LLE deficits/detail RLE Deficits / Details: noted uncontrolled jerking movements of the leg LLE Deficits / Details: same as right    Cervical / Trunk Assessment Cervical / Trunk Assessment: Normal  Communication   Communication: No difficulties  Cognition Arousal/Alertness: Awake/alert Behavior During Therapy: WFL for tasks assessed/performed  General Comments: somewhat agitated about his medication      General Comments      Exercises     Assessment/Plan    PT Assessment Patient needs continued PT services  PT Problem List Decreased strength;Decreased knowledge of precautions;Decreased mobility;Decreased activity tolerance;Decreased safety awareness       PT Treatment Interventions DME instruction;Therapeutic activities;Gait training;Therapeutic  exercise;Patient/family education;Functional mobility training    PT Goals (Current goals can be found in the Care Plan section)  Acute Rehab PT Goals Patient Stated Goal: to go home, PT Goal Formulation: With patient Time For Goal Achievement: 06/13/20 Potential to Achieve Goals: Good    Frequency Min 3X/week   Barriers to discharge        Co-evaluation               AM-PAC PT "6 Clicks" Mobility  Outcome Measure Help needed turning from your back to your side while in a flat bed without using bedrails?: None Help needed moving from lying on your back to sitting on the side of a flat bed without using bedrails?: None Help needed moving to and from a bed to a chair (including a wheelchair)?: A Little Help needed standing up from a chair using your arms (e.g., wheelchair or bedside chair)?: A Little Help needed to walk in hospital room?: A Little Help needed climbing 3-5 steps with a railing? : A Lot 6 Click Score: 19    End of Session Equipment Utilized During Treatment: Gait belt Activity Tolerance: Patient tolerated treatment well Patient left: in chair;with call bell/phone within reach;with chair alarm set Nurse Communication: Mobility status PT Visit Diagnosis: Unsteadiness on feet (R26.81);Difficulty in walking, not elsewhere classified (R26.2)    Time: 9702-6378 PT Time Calculation (min) (ACUTE ONLY): 23 min   Charges:   PT Evaluation $PT Eval Low Complexity: [place] PT Acute Rehabilitation Services Pager [phone] Office [phone]  Claretha Cooper 05/30/2020, 2:00 PM

## 2020-05-31 DIAGNOSIS — R0789 Other chest pain: Secondary | ICD-10-CM

## 2020-05-31 DIAGNOSIS — F1120 Opioid dependence, uncomplicated: Secondary | ICD-10-CM

## 2020-05-31 LAB — BASIC METABOLIC PANEL
Anion gap: 13 (ref 5–15)
Anion gap: 12 (ref 5–15)
BUN: 31 mg/dL — ABNORMAL HIGH (ref 6–20)
BUN: 32 mg/dL — ABNORMAL HIGH (ref 6–20)
CO2: 19 mmol/L — ABNORMAL LOW (ref 22–32)
CO2: 22 mmol/L (ref 22–32)
Calcium: 8 mg/dL — ABNORMAL LOW (ref 8.9–10.3)
Calcium: 7.8 mg/dL — ABNORMAL LOW (ref 8.9–10.3)
Chloride: 85 mmol/L — ABNORMAL LOW (ref 98–111)
Chloride: 86 mmol/L — ABNORMAL LOW (ref 98–111)
Creatinine, Ser: 1.58 mg/dL — ABNORMAL HIGH (ref 0.61–1.24)
Creatinine, Ser: 1.61 mg/dL — ABNORMAL HIGH (ref 0.61–1.24)
GFR calc Af Amer: 54 mL/min — ABNORMAL LOW (ref >60)
GFR calc Af Amer: 53 mL/min — ABNORMAL LOW (ref >60)
GFR calc non Af Amer: 47 mL/min — ABNORMAL LOW (ref >60)
GFR calc non Af Amer: 46 mL/min — ABNORMAL LOW (ref >60)
Glucose, Bld: 151 mg/dL — ABNORMAL HIGH (ref 70–99)
Glucose, Bld: 148 mg/dL — ABNORMAL HIGH (ref 70–99)
Potassium: 4 mmol/L (ref 3.5–5.1)
Potassium: 3.8 mmol/L (ref 3.5–5.1)
Sodium: 117 mmol/L — CL (ref 135–145)
Sodium: 120 mmol/L — ABNORMAL LOW (ref 135–145)

## 2020-05-31 LAB — CBC WITH DIFFERENTIAL/PLATELET
Abs Immature Granulocytes: 0.01 10*3/uL (ref 0.00–0.07)
Basophils Absolute: 0 10*3/uL (ref 0.0–0.1)
Basophils Relative: 1 %
Eosinophils Absolute: 0.1 10*3/uL (ref 0.0–0.5)
Eosinophils Relative: 2 %
HCT: 30.7 % — ABNORMAL LOW (ref 39.0–52.0)
Hemoglobin: 10.5 g/dL — ABNORMAL LOW (ref 13.0–17.0)
Immature Granulocytes: 0 %
Lymphocytes Relative: 10 %
Lymphs Abs: 0.3 10*3/uL — ABNORMAL LOW (ref 0.7–4.0)
MCH: 30.6 pg (ref 26.0–34.0)
MCHC: 34.2 g/dL (ref 30.0–36.0)
MCV: 89.5 fL (ref 80.0–100.0)
Monocytes Absolute: 0.5 10*3/uL (ref 0.1–1.0)
Monocytes Relative: 17 %
Neutro Abs: 1.8 10*3/uL (ref 1.7–7.7)
Neutrophils Relative %: 70 %
Platelets: 147 10*3/uL — ABNORMAL LOW (ref 150–400)
RBC: 3.43 MIL/uL — ABNORMAL LOW (ref 4.22–5.81)
RDW: 13.2 % (ref 11.5–15.5)
WBC: 2.6 10*3/uL — ABNORMAL LOW (ref 4.0–10.5)
nRBC: 0 % (ref 0.0–0.2)

## 2020-05-31 LAB — GLUCOSE, CAPILLARY
Glucose-Capillary: 138 mg/dL — ABNORMAL HIGH (ref 70–99)
Glucose-Capillary: 146 mg/dL — ABNORMAL HIGH (ref 70–99)
Glucose-Capillary: 145 mg/dL — ABNORMAL HIGH (ref 70–99)
Glucose-Capillary: 152 mg/dL — ABNORMAL HIGH (ref 70–99)

## 2020-05-31 LAB — OSMOLALITY, URINE: Osmolality, Ur: 198 mOsm/kg — ABNORMAL LOW (ref 300–900)

## 2020-05-31 LAB — MAGNESIUM: Magnesium: 2 mg/dL (ref 1.7–2.4)

## 2020-05-31 LAB — SODIUM, URINE, RANDOM: Sodium, Ur: <10 mmol/L

## 2020-05-31 LAB — SODIUM
Sodium: 120 mmol/L — ABNORMAL LOW (ref 135–145)
Sodium: 122 mmol/L — ABNORMAL LOW (ref 135–145)

## 2020-05-31 LAB — OSMOLALITY: Osmolality: 266 mOsm/kg — ABNORMAL LOW (ref 275–295)

## 2020-05-31 MED ORDER — SODIUM CHLORIDE 3 % IV SOLN
INTRAVENOUS | Status: DC
  Filled 2020-05-31: qty 500

## 2020-05-31 MED ORDER — TOLVAPTAN 15 MG PO TABS
30.0000 mg | ORAL_TABLET | ORAL | Status: AC
  Administered 2020-05-31 – 2020-06-01 (×2): 30 mg via ORAL
  Filled 2020-05-31 (×3): qty 2

## 2020-05-31 MED ORDER — FUROSEMIDE 10 MG/ML IJ SOLN
40.0000 mg | Freq: Once | INTRAMUSCULAR | Status: AC
  Administered 2020-05-31: 40 mg via INTRAVENOUS
  Filled 2020-05-31: qty 4

## 2020-05-31 NOTE — Progress Notes (Signed)
CRITICAL VALUE ALERT  Critical Value:  Na:117  Date & Time Notified:  05-31-20 @ 0756  Provider Notified: Dr. Bonner Puna  Orders Received/Actions taken: Awaiting
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
## 2020-05-31 NOTE — Progress Notes (Signed)
CSW spoke with patient at bedside. CSW offered patient Outpatient substance abuse treatment services resources. Patient accepted.  CSW will continue to follow patient for any other needs.

## 2020-05-31 NOTE — Progress Notes (Signed)
Transfer from [facility] arrived at 1615hrs.  Oriented to unit and plan of care for shift, patient verbalized understanding.

## 2020-05-31 NOTE — Progress Notes (Signed)
RN provided handoff report to receiving RN, Vaughan Basta, at [place] notified this RN that [facility] has been contacted and has patient on their transfer list.
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
## 2020-05-31 NOTE — Progress Notes (Addendum)
Progress Note  Patient Name: Brad [name] Date of Encounter: 05/31/2020  Primary Cardiologist: [name], MD   Subjective   Denies any chest pain. SOB improved.  Na remains low at 117 and repeat 120  Inpatient Medications    Scheduled Meds: . atorvastatin  20 mg Oral q1800  . DULoxetine  60 mg Oral Daily  . enoxaparin (LOVENOX) injection  40 mg Subcutaneous Q24H  . folic acid  1 mg Oral Daily  . gabapentin  600 mg Oral QHS  . insulin aspart  0-5 Units Subcutaneous QHS  . insulin aspart  0-9 Units Subcutaneous TID WC  . lidocaine  1 patch Transdermal Q24H  . multivitamin with minerals  1 tablet Oral Daily  . sodium chloride flush  3 mL Intravenous Once  . spironolactone  25 mg Oral Daily  . thiamine  100 mg Oral Daily   Continuous Infusions: . sodium chloride (hypertonic)     PRN Meds: acetaminophen, albuterol, LORazepam, oxyCODONE, traMADol   Vital Signs    Vitals:   05/30/20 1324 05/30/20 2125 05/31/20 0500 05/31/20 0504  BP: 128/86 131/85  121/79  Pulse: 60 (!) 57  66  Resp: 18     Temp: (!) 97.4 F (36.3 C) 97.9 F (36.6 C)  98.4 F (36.9 C)  TempSrc: Oral Oral  Axillary  SpO2: 99% 100%  100%  Weight:   82.2 kg   Height:        Intake/Output Summary (Last 24 hours) at 05/31/2020 1128 Last data filed at 05/31/2020 0900 Gross per 24 hour  Intake 1345 ml  Output 725 ml  Net 620 ml   Filed Weights   05/29/20 0830 05/30/20 0542 05/31/20 0500  Weight: 80.3 kg 81.7 kg 82.2 kg    Telemetry    Atrial fibrillation with CVR - Personally Reviewed  ECG    No new EKG to review - Personally Reviewed  Physical Exam   GEN: No acute distress.   Neck: No JVD Cardiac: irregularly irregular, no murmurs, rubs, or gallops.  Respiratory: Clear to auscultation bilaterally. GI: Soft, nontender, non-distended  MS: No edema; No deformity. Neuro:  Nonfocal  Psych: Normal affect   Labs    Chemistry Recent Labs  Lab 05/29/20 0958 05/29/20 0958  05/30/20 0432 05/31/20 0424 05/31/20 0918  NA 120*   < > 120* 117* 120*  K 3.9  --  4.2 4.0  --   CL 85*  --  88* 85*  --   CO2 23  --  21* 19*  --   GLUCOSE 168*  --  116* 151*  --   BUN 19  --  25* 31*  --   CREATININE 1.19  --  1.24 1.58*  --   CALCIUM 8.1*  --  8.1* 8.0*  --   GFRNONAA >60  --  >60 47*  --   GFRAA >60  --  >60 54*  --   ANIONGAP 12  --  11 13  --    < > = values in this interval not displayed.     Hematology Recent Labs  Lab 05/29/20 0432 05/31/20 0424  WBC 3.7* 2.6*  RBC 3.78* 3.43*  HGB 11.5* 10.5*  HCT 33.2* 30.7*  MCV 87.8 89.5  MCH 30.4 30.6  MCHC 34.6 34.2  RDW 13.5 13.2  PLT 192 147*    Cardiac EnzymesNo results for input(s): TROPONINI in the last 168 hours. No results for input(s): TROPIPOC in the last 168 hours.  BNP Recent Labs  Lab 05/29/20 0430  BNP 678.3*     DDimer No results for input(s): DDIMER in the last 168 hours.   Radiology    No results found.  Cardiac Studies   2D echo 03/2020 IMPRESSIONS    1. Left ventricular ejection fraction, by estimation, is 35 to 40%. The  left ventricle has moderately decreased function. The left ventricle  demonstrates global hypokinesis. The left ventricular internal cavity size  was mildly dilated. There is mild  eccentric left ventricular hypertrophy. Left ventricular diastolic  function could not be evaluated.  2. Right ventricular systolic function is moderately reduced. The right  ventricular size is normal. Tricuspid regurgitation signal is inadequate  for assessing PA pressure.  3. Left atrial size was severely dilated.  4. Right atrial size was severely dilated.  5. The mitral valve is normal in structure. Moderate to severe mitral  valve regurgitation.  6. Tricuspid valve regurgitation is moderate.  7. The aortic valve is normal in structure. Aortic valve regurgitation is  mild.  8. The inferior vena cava is dilated in size with <50% respiratory  variability,  suggesting right atrial pressure of 15 mmHg.   Patient Profile     59 y.o. male with a hx of persistent atrial fibrillation not on AC, HTN, HLD, chronic systolic heart failure, NICM, CKD III, hep C, cirrhosis, DM, hx of IVDU,  And hx of hyponatremia in the setting of intravascular depletion and excessive PO intake/CHF who is being seen  for the evaluation of CHF at the request of Dr. Bonner Puna.  Assessment & Plan     Acute on chronic systolic heart failure Nonischemic cardiomyopathy - EF 35-40% - has been on torsemide 40/20 mg, bidil, and spiro - no BB due to bradycardia in the past - started on 40 mg IV lasix BID - ? If I&Os are complete as it only has that he put out 350cc and net neg 255cc since yesterday - weight continues to trend upward and up 1 lb from yesterday and 6lbs from admit  - he reports his dry weight is 170 lbs and presented with a 15 lb weight gain - BNP 678 - creatinine trending upward - strict I&Os and daily weights - home regimen includes digoxin, spironolactone, and torsemide - he is not responding to diuretics and creatinine worsening with no change in Na  - his weight is up 15lbs - I think he needs to be transferred to [facility] so AHF can consult for further help - I have spoken with Dr. Haroldine Laws who will see patient on transfer to [facility]  Hyponatremia - hx of multiple admissions with hyponatremia in the setting of excessive fluid/beer ingestion - Na 117 --> 120>>117>>120 - has required tolvaptan in the past - suspect this is due to excessive beer intake and drop in diuretic dose recently, although he reports only drinking one beer per day - per primary, but agree with diuresis - transferring to [facility] for AHF eval - discussed with Dr. Haroldine Laws and will start Tolvaptan 30mg  daily for 2 days.  Chest pain - constant chest pain that is reproducible on palpation and c/w MSK and ? Drug seeking behavior as he keeps asking for pain meds - nonobstructive disease on last  heart cath in 2017 - hs troponin 27 --> 31 - he denies recent falls - will hold off on ischemic evaluation given this atypical constant chest pain that is exquisitely tender to touch - suspect mild CE elevation due to  demand ischemia in setting of volume overload  Persistent atrial fibrillation - not on anticoagulation due to falls - EKG with CVR - previously D/C'ed amiodarone due to low HR - no BB for bradycardia  Hypertension - maintained on spiro  Hyperlipidemia 04/19/2020: Cholesterol 206; HDL 104; LDL Cholesterol 86; Triglycerides 78; VLDL 16 - continue statin  CKD stage III - sCr trending upward with diuresis  ETOH use - he denies excessive use, ETOH level < 10  I have spent a total of 35 minutes with patient reviewing hospital notes, discussing case with Dr. Haroldine Laws , telemetry, EKGs, labs and examining patient as well as establishing an assessment and plan that was discussed with the patient.  > 50% of time was spent in direct patient care.       For questions or updates, please contact [place] Please consult [email or web address] for contact info under Cardiology/STEMI.      Signed, Fransico Him, MD  05/31/2020, 11:28 AM

## 2020-06-01 DIAGNOSIS — E1159 Type 2 diabetes mellitus with other circulatory complications: Secondary | ICD-10-CM

## 2020-06-01 DIAGNOSIS — I5043 Acute on chronic combined systolic (congestive) and diastolic (congestive) heart failure: Secondary | ICD-10-CM

## 2020-06-01 DIAGNOSIS — Z794 Long term (current) use of insulin: Secondary | ICD-10-CM

## 2020-06-01 LAB — GLUCOSE, CAPILLARY
Glucose-Capillary: 146 mg/dL — ABNORMAL HIGH (ref 70–99)
Glucose-Capillary: 303 mg/dL — ABNORMAL HIGH (ref 70–99)
Glucose-Capillary: 152 mg/dL — ABNORMAL HIGH (ref 70–99)
Glucose-Capillary: 161 mg/dL — ABNORMAL HIGH (ref 70–99)
Glucose-Capillary: 160 mg/dL — ABNORMAL HIGH (ref 70–99)

## 2020-06-01 LAB — BASIC METABOLIC PANEL
Anion gap: 9 (ref 5–15)
BUN: 32 mg/dL — ABNORMAL HIGH (ref 6–20)
CO2: 24 mmol/L (ref 22–32)
Calcium: 8.5 mg/dL — ABNORMAL LOW (ref 8.9–10.3)
Chloride: 89 mmol/L — ABNORMAL LOW (ref 98–111)
Creatinine, Ser: 1.73 mg/dL — ABNORMAL HIGH (ref 0.61–1.24)
GFR calc Af Amer: 49 mL/min — ABNORMAL LOW (ref >60)
GFR calc non Af Amer: 42 mL/min — ABNORMAL LOW (ref >60)
Glucose, Bld: 134 mg/dL — ABNORMAL HIGH (ref 70–99)
Potassium: 4.1 mmol/L (ref 3.5–5.1)
Sodium: 122 mmol/L — ABNORMAL LOW (ref 135–145)

## 2020-06-01 LAB — SODIUM
Sodium: 121 mmol/L — ABNORMAL LOW (ref 135–145)
Sodium: 123 mmol/L — ABNORMAL LOW (ref 135–145)

## 2020-06-01 LAB — MAGNESIUM: Magnesium: 1.8 mg/dL (ref 1.7–2.4)

## 2020-06-01 MED ORDER — FUROSEMIDE 10 MG/ML IJ SOLN
80.0000 mg | Freq: Two times a day (BID) | INTRAMUSCULAR | Status: DC
  Administered 2020-06-01 – 2020-06-02 (×4): 80 mg via INTRAVENOUS
  Filled 2020-06-01 (×4): qty 8

## 2020-06-01 MED ORDER — MAGNESIUM SULFATE 2 GM/50ML IV SOLN
2.0000 g | Freq: Once | INTRAVENOUS | Status: AC
  Administered 2020-06-01: 2 g via INTRAVENOUS
  Filled 2020-06-01: qty 50

## 2020-06-01 MED ORDER — POTASSIUM CHLORIDE CRYS ER 20 MEQ PO TBCR
40.0000 meq | EXTENDED_RELEASE_TABLET | Freq: Once | ORAL | Status: AC
  Administered 2020-06-01: 40 meq via ORAL
  Filled 2020-06-01: qty 2

## 2020-06-01 MED ORDER — ISOSORB DINITRATE-HYDRALAZINE 20-37.5 MG PO TABS
0.5000 | ORAL_TABLET | Freq: Three times a day (TID) | ORAL | Status: DC
  Administered 2020-06-01 – 2020-06-05 (×13): 0.5 via ORAL
  Filled 2020-06-01 (×13): qty 1

## 2020-06-01 NOTE — Progress Notes (Signed)
Physical Therapy Treatment Patient Details Name: Brad [name] MRN: [id number] DOB: Apr 19, 1961 Today's Date: 06/01/2020    History of Present Illness  59 y.o. male with a pertinent prior medical history of of ETOH abuse, Afib (not anticoagulated due to falls on EtOH), CKD IIIa, systolic CHF, HTN with multiple admissions this year for chronic hyponatremia, who presenting with shortness of breath and chest pains that woke him up at night on 6/8.    PT Comments    On entry pt upset that no one has brought him a soda. Pt educated on need for fluid restriction. Pt appreciative of being told why he can't have more fluids. Pt agreeable to ambulation with therapy. Pt is limited in safe mobility by poor health awareness, and knowledge of deficits, in presence of decreased strength, balance and endurance. Pt is mod I for bed mobility, min guard for transfers and minA for ambulation of 450 feet with straight cane. Pt educated that he would have more independent stability if he used RW but continues to want to only use straight cane. D/c plans remain appropriate. PT will continue to follow acutely for strength and balance training.    Follow Up Recommendations  No PT follow up     Equipment Recommendations  None recommended by PT       Precautions / Restrictions Precautions Precautions: Fall Restrictions Weight Bearing Restrictions: No    Mobility  Bed Mobility Overal bed mobility: Modified Independent                Transfers Overall transfer level: Needs assistance Equipment used: Straight cane Transfers: Sit to/from Stand Sit to Stand: Min guard         General transfer comment: min guard for safety   Ambulation/Gait Ambulation/Gait assistance: Min assist Gait Distance (Feet): 450 Feet Assistive device: Straight cane Gait Pattern/deviations: Step-through pattern;Antalgic;Decreased stride length;Trunk flexed Gait velocity: slowed Gait velocity interpretation: 1.31 -  2.62 ft/sec, indicative of limited community ambulator General Gait Details: pt educated that RW use would improve his stability however pt prefers straight cane use, min A for steadying and 2 times standing rest break for burning pain in bilateral LE, in last 20 feet of ambulation pt experiencing L knee buckling   Stairs             Wheelchair Mobility    Modified [name] (Stroke Patients Only)       Balance Overall balance assessment: Needs assistance Sitting-balance support: No upper extremity supported;Feet supported Sitting balance-Leahy Scale: Good     Standing balance support: Single extremity supported Standing balance-Leahy Scale: Poor                              Cognition Arousal/Alertness: Awake/alert Behavior During Therapy: WFL for tasks assessed/performed                                   General Comments: somewhat agitated about his fluid restrictions         General Comments General comments (skin integrity, edema, etc.): Pt is upset about is fluid restrictions, provided education on how much fluid 1575mL actually is and how he can monitor throughout day       Pertinent Vitals/Pain Pain Assessment: Faces Faces Pain Scale: Hurts even more Pain Location: chest and bilateral LE with walking (claudication?) Pain Descriptors / Indicators: Sore;Cramping Pain Intervention(s): Limited activity within  patient's tolerance;Monitored during session;Repositioned           PT Goals (current goals can now be found in the care plan section) Acute Rehab PT Goals PT Goal Formulation: With patient Time For Goal Achievement: 06/15/20 Potential to Achieve Goals: Good Progress towards PT goals: Progressing toward goals    Frequency    Min 3X/week      PT Plan Current plan remains appropriate       AM-PAC PT "6 Clicks" Mobility   Outcome Measure  Help needed turning from your back to your side while in a flat bed without  using bedrails?: None Help needed moving from lying on your back to sitting on the side of a flat bed without using bedrails?: None Help needed moving to and from a bed to a chair (including a wheelchair)?: A Little Help needed standing up from a chair using your arms (e.g., wheelchair or bedside chair)?: A Little Help needed to walk in hospital room?: A Little Help needed climbing 3-5 steps with a railing? : A Lot 6 Click Score: 19    End of Session Equipment Utilized During Treatment: Gait belt Activity Tolerance: Patient tolerated treatment well Patient left: in bed;with call bell/phone within reach;with bed alarm set Nurse Communication: Mobility status PT Visit Diagnosis: Unsteadiness on feet (R26.81);Difficulty in walking, not elsewhere classified (R26.2)     Time: 1010-1033 PT Time Calculation (min) (ACUTE ONLY): 23 min  Charges:  $Gait Training: 8-22 mins $Self Care/Home Management: 8-22                     Chance Munter B. Migdalia Dk PT, DPT Acute Rehabilitation Services Pager [phone] Office [phone]    [place] 06/01/2020, 10:58 AM

## 2020-06-01 NOTE — Plan of Care (Signed)
  Problem: Nutrition: Goal: Adequate nutrition will be maintained Outcome: Progressing   Problem: Coping: Goal: Level of anxiety will decrease Outcome: Progressing   Problem: Elimination: Goal: Will not experience complications related to bowel motility Outcome: Progressing   

## 2020-06-01 NOTE — Progress Notes (Addendum)
Advanced Heart Failure Rounding Note  PCP-Cardiologist: [name], MD    Patient Profile   59 y/o male with polysubstance abuse (ETOH, cocaine tobacco), cirrhosis, permanent AF, hyponatremia and systolic HF due to NICM EF 20-25% in past (most recently 35-40% in 4/21). Readmitted with CP, a/c systolic HF and recurrent hyponatremia.   Subjective:    NA 117 on admit 2/2 CHF + beer potamania.   Received 1 x dose of tolvaptan on 6/10. Na 122 today. Mentation ok but having rt lower extremity muscle spasms.   Given 40 mg IV Lasix x 1 yesterday, w/ -1.4L in UOP. SCr trending up, 1.19>>1.24>>1.58>>1.61>>1.73.   PVCs < 10/hr overnight.    Objective:   Weight Range: 81.8 kg Body mass index is 25.15 kg/m.   Vital Signs:   Temp:  [97.7 F (36.5 C)-98.3 F (36.8 C)] 97.7 F (36.5 C) (06/11 0747) Pulse Rate:  [54-68] 54 (06/11 0747) Resp:  [16-20] 20 (06/11 0747) BP: (103-134)/(70-93) 129/85 (06/11 0747) SpO2:  [93 %-100 %] 98 % (06/11 0747) Weight:  [80.3 kg-81.8 kg] 81.8 kg (06/11 0432) Last BM Date: 06/01/20  Weight change: Filed Weights   05/31/20 0500 05/31/20 1616 06/01/20 0432  Weight: 82.2 kg 80.3 kg 81.8 kg    Intake/Output:   Intake/Output Summary (Last 24 hours) at 06/01/2020 0942 Last data filed at 06/01/2020 0635 Gross per 24 hour  Intake 1880 ml  Output 1000 ml  Net 880 ml      Physical Exam    General:  fatigue appearing. No resp difficulty, dentition in poor repair  HEENT: Normal Neck: Supple. JVP elevated. Carotids 2+ bilat; no bruits. No lymphadenopathy or thyromegaly appreciated. Cor: PMI nondisplaced. Irregularly, irregular rhythm, regular rate. No rubs, gallops or murmurs. Lungs: Clear Abdomen: Soft, nontender, nondistended. No hepatosplenomegaly. No bruits or masses. Good bowel sounds. Extremities: No cyanosis, clubbing, rash, edema Neuro: Alert & orientedx3, cranial nerves grossly intact. moves all 4 extremities w/o difficulty. Affect  pleasant   Telemetry   Chronic afib, rate controlled in the 60s w/ PVCs (<10/hr)  EKG    No new EKG to review   Labs    CBC Recent Labs    05/31/20 0424  WBC 2.6*  NEUTROABS 1.8  HGB 10.5*  HCT 30.7*  MCV 89.5  PLT 397*   Basic Metabolic Panel Recent Labs    05/31/20 0424 05/31/20 0918 05/31/20 1334 06/01/20 0609  NA 117*   < > 120* 122*  K 4.0  --  3.8 4.1  CL 85*  --  86* 89*  CO2 19*  --  22 24  GLUCOSE 151*  --  148* 134*  BUN 31*  --  32* 32*  CREATININE 1.58*  --  1.61* 1.73*  CALCIUM 8.0*  --  7.8* 8.5*  MG 2.0  --   --  1.8   < > = values in this interval not displayed.   Liver Function Tests No results for input(s): AST, ALT, ALKPHOS, BILITOT, PROT, ALBUMIN in the last 72 hours. No results for input(s): LIPASE, AMYLASE in the last 72 hours. Cardiac Enzymes No results for input(s): CKTOTAL, CKMB, CKMBINDEX, TROPONINI in the last 72 hours.  BNP: BNP (last 3 results) Recent Labs    05/03/20 1432 05/10/20 1225 05/29/20 0430  BNP 1,538.7* 745.9* 678.3*    ProBNP (last 3 results) No results for input(s): PROBNP in the last 8760 hours.   D-Dimer No results for input(s): DDIMER in the last 72 hours.  Hemoglobin A1C No results for input(s): HGBA1C in the last 72 hours. Fasting Lipid Panel No results for input(s): CHOL, HDL, LDLCALC, TRIG, CHOLHDL, LDLDIRECT in the last 72 hours. Thyroid Function Tests No results for input(s): TSH, T4TOTAL, T3FREE, THYROIDAB in the last 72 hours.  Invalid input(s): FREET3  Other results:   Imaging     No results found.   Medications:     Scheduled Medications: . atorvastatin  20 mg Oral q1800  . DULoxetine  60 mg Oral Daily  . enoxaparin (LOVENOX) injection  40 mg Subcutaneous Q24H  . folic acid  1 mg Oral Daily  . gabapentin  600 mg Oral QHS  . insulin aspart  0-5 Units Subcutaneous QHS  . insulin aspart  0-9 Units Subcutaneous TID WC  . lidocaine  1 patch Transdermal Q24H  . multivitamin  with minerals  1 tablet Oral Daily  . sodium chloride flush  3 mL Intravenous Once  . spironolactone  25 mg Oral Daily  . thiamine  100 mg Oral Daily  . tolvaptan  30 mg Oral Q24H     Infusions:   PRN Medications:  acetaminophen, albuterol, LORazepam, oxyCODONE    Assessment/Plan   1. A/C Systolic Heart Failure  - ECHO 2021 EF 35-40% . NICM  - Volume status elevated but sluggish diuresis despite IV lasix and rising SCr. ? Low output. Consider placement of PICC line to check co-ox and CVPs   - no BB with bradycardia  - Continue spiro 25 mg daily.  - Follow renal function.   2. Hyponatremia -Sodium 120>117>>122 - due to HF/beer potamania - Received dose of tolvaptan 6/10 - Will give another dose of 30 mg of tolvaptan today - Restrict Free Water   3. Chest  Pain  - Doubt ACS.  - HS Trop 27>.31  4. A fib, permanent  - No bb with bradycardia. Rate controlled.  - no anticoagulant with ETOH abuse and falls.   5. ETOH Abuse  - Discussed alcohol cessation.   6. PVCs -Has not been on amiodarone /digoxin due to bradycardia.  - <10 PVCs/hr overnight, but appears to be increasing - continue to monitor on tele - Mg 1.8. will supp 2 gm - K 4.1   7. AKI -Creatinine trending up 1.19>>1.24>>1.58>>1.61>>1.73 -? Low output HF. Will discuss w/ MD possible PICC line placement to check co-ox - continue to monitor closely   Length of Stay: 3  Brittainy Ladoris Gene  06/01/2020, 9:42 AM  Advanced Heart Failure Team Pager [phone] (M-F; 7a - 4p)  Please contact [facility] for night-coverage after hours (4p -7a ) and weekends on [email or web address]  Patient seen with PA, agree with the above note.   Sodium higher today at 122, creatinine up to 1.73.  He remains in atrial fibrillation.  Denies dyspnea at rest.  Has a hard time with fluid restriction.  He has constant chest pain, mild, with pleuritic component.  HS-TnI mildly elevated with no trend.   General: NAD Neck: JVP  14 cm, no thyromegaly or thyroid nodule.  Lungs: rhonchi left base CV: Lateral PMI.  Heart regular S1/S2, no S3/S4, 2/6 HSM LLSB/apex.  Trace ankle edema.   Abdomen: Soft, nontender, no hepatosplenomegaly, no distention.  Skin: Intact without lesions or rashes.  Neurologic: Alert and oriented x 3.  Psych: Normal affect. Extremities: No clubbing or cyanosis.  HEENT: Normal.   1. Hyponatremia: Recurrent. Sodium as low as 117, 122 today. Volume overloaded + large po intake (beer, other fluids).  Suspect hypervolemic hyponatremia. He had tolvaptan yesterday.  - Tolvaptan 30 mg po x 1 today.  - Continue to fluid restrict.  - Diurese with IV Lasix as below.  2. Acute on chronic systolic CHF: Echo (4/29) EF 07/2019 EF 25-30%, moderately reduced RV fxn. Nonischemic cardiomyopathy, probably due to substance abuse (ETOH, prior cocaine). UDS negative this admission.  He remains volume overloaded on exam. This is complicated by CKD stage 3, creatinine 1.73 today.    - Lasix 80 mg IV bid today.   - He has been off beta blocker with bradycardia.   - Restart Bidil at 0.5 tab tid, should have BP room.    - Continue spironolactone 25 mg daily.  3. Atrial fibrillation: Persistent. He has been off anticoagulation due to heavy ETOH abuse and frequent falls.  - Off Coreg with low HR.  - Without anticoagulation, DCCV is not an option so stopped amiodarone.  - If he quits drinking and improves stability, start Eliquis.  4. ETOH abuse: Ideally needs rehab program. Still drinking some at home.  5. PVCs: <10 PVCs/hr on telemetry.   Loralie Champagne 06/01/2020 11:01 AM

## 2020-06-01 NOTE — Progress Notes (Signed)
PROGRESS NOTE  Brad [name] HWY:[id number] DOB: [birth year] DOA: 05/29/2020 PCP: Charlott Rakes, MD   LOS: 3 days   Brief Narrative / Interim history: 59 year old male with history of A. fib, chronic kidney disease stage IIIa with baseline creatinine around 1.3-1.4, prior drug use but nothing currently, alcohol abuse came into the hospital with shortness of breath, cough, lower extremity edema and orthopnea for 1 to 2 days.  He was found to be fluid overloaded, hyponatremic with sodium of 117 and was admitted to the hospital.  Chest x-ray on admission showed cardiomegaly, bilateral hilar vascular congestion.  He was started on IV furosemide without significant improvement, had bradycardia when amiodarone and digoxin were started and these were successfully stopped.  He was eventually transferred to [facility] for heart failure team evaluation.  Subjective / 24h Interval events: States he is feeling slightly better, denies any chest pain, denies any shortness of breath.  Assessment & Plan: Principal Problem Acute on chronic systolic CHF, nonischemic cardiomyopathy -Most recent EF 35-40% in April 2021 with global hypokinesis of the left ventricle.  Still looks fluid overloaded this morning, has been placed on IV diuresis and will continue for now.  Heart failure team following  Active Problems Hypervolemic hyponatremia in the setting of underlying CHF and beer use -Receiving tolvaptan per heart failure team, sodium 121 this morning.  Continue to monitor.   Acute kidney injury on chronic kidney disease stage IIIa -Baseline creatinine 1.3-1.4, currently around 1.7 likely due to diuresis.  Continue Lasix as per cardiology.  Closely monitor, continue to avoid nephrotoxins  Persistent atrial fibrillation with slow ventricular response -Holding of digoxin, amiodarone.  Off anticoagulation due to fall risk/alcohol abuse  Hyperlipidemia -continue atorvastatin  Liver cirrhosis -Due  to chronic hep C and alcoholic liver disease, this is contributing to hyponatremia as well  Alcohol use -No clinical signs of withdrawals  DM2 -Continue sliding scale  CBG (last 3)  Recent Labs    06/01/20 0738 06/01/20 0742 06/01/20 1104  GLUCAP 303* 146* 152*    Scheduled Meds: . atorvastatin  20 mg Oral q1800  . DULoxetine  60 mg Oral Daily  . enoxaparin (LOVENOX) injection  40 mg Subcutaneous Q24H  . folic acid  1 mg Oral Daily  . furosemide  80 mg Intravenous Q12H  . gabapentin  600 mg Oral QHS  . insulin aspart  0-5 Units Subcutaneous QHS  . insulin aspart  0-9 Units Subcutaneous TID WC  . isosorbide-hydrALAZINE  0.5 tablet Oral TID  . lidocaine  1 patch Transdermal Q24H  . multivitamin with minerals  1 tablet Oral Daily  . potassium chloride  40 mEq Oral Once  . sodium chloride flush  3 mL Intravenous Once  . spironolactone  25 mg Oral Daily  . thiamine  100 mg Oral Daily  . tolvaptan  30 mg Oral Q24H   Continuous Infusions: . magnesium sulfate bolus IVPB     PRN Meds:.acetaminophen, albuterol, LORazepam, oxyCODONE  DVT prophylaxis: Lovenox Code Status: Full code Family Communication: no family at bedside   Status is: Inpatient  Remains inpatient appropriate because:Inpatient level of care appropriate due to severity of illness   Dispo: The patient is from: Home              Anticipated d/c is to: Home              Anticipated d/c date is: 3 days  Patient currently is not medically stable to d/c.  Consultants:  Cardiology   Procedures:  none  Microbiology  None   Antimicrobials: None     Objective: Vitals:   05/31/20 1925 06/01/20 0014 06/01/20 0432 06/01/20 0747  BP: 127/78 130/84 103/70 129/85  Pulse: 62 64 68 (!) 54  Resp: 16 19 19 20   Temp: 98.3 F (36.8 C) 97.8 F (36.6 C) 97.8 F (36.6 C) 97.7 F (36.5 C)  TempSrc: Oral Oral Oral Oral  SpO2: 97% 93% 95% 98%  Weight:   81.8 kg   Height:        Intake/Output  Summary (Last 24 hours) at 06/01/2020 1055 Last data filed at 06/01/2020 1030 Gross per 24 hour  Intake 1880 ml  Output 1400 ml  Net 480 ml   Filed Weights   05/31/20 0500 05/31/20 1616 06/01/20 0432  Weight: 82.2 kg 80.3 kg 81.8 kg    Examination:  Constitutional: NAD Eyes: no scleral icterus ENMT: Mucous membranes are moist.  Neck: normal, supple Respiratory: clear to auscultation bilaterally, no wheezing, no crackles. Normal respiratory effort. No accessory muscle use.  Cardiovascular: irregular, no mrg, 1+ edema  Abdomen: non distended, no tenderness. Bowel sounds positive.  Musculoskeletal: no clubbing / cyanosis.  Skin: no rashes Neurologic: non focal   Data Reviewed: I have independently reviewed following labs and imaging studies   CBC: Recent Labs  Lab 05/29/20 0432 05/31/20 0424  WBC 3.7* 2.6*  NEUTROABS  --  1.8  HGB 11.5* 10.5*  HCT 33.2* 30.7*  MCV 87.8 89.5  PLT 192 509*   Basic Metabolic Panel: Recent Labs  Lab 05/29/20 0432 05/29/20 0645 05/29/20 0754 05/29/20 0958 05/29/20 0958 05/30/20 0432 05/30/20 0432 05/31/20 0424 05/31/20 0424 05/31/20 0918 05/31/20 1122 05/31/20 1334 06/01/20 0609 06/01/20 0749  NA   < >  --   --  120*   < > 120*   < > 117*   < > 120* 122* 120* 122* 121*  K   < >  --   --  3.9  --  4.2  --  4.0  --   --   --  3.8 4.1  --   CL   < >  --   --  85*  --  88*  --  85*  --   --   --  86* 89*  --   CO2   < >  --   --  23  --  21*  --  19*  --   --   --  22 24  --   GLUCOSE   < >  --   --  168*  --  116*  --  151*  --   --   --  148* 134*  --   BUN   < >  --   --  19  --  25*  --  31*  --   --   --  32* 32*  --   CREATININE   < >  --   --  1.19  --  1.24  --  1.58*  --   --   --  1.61* 1.73*  --   CALCIUM   < >  --   --  8.1*  --  8.1*  --  8.0*  --   --   --  7.8* 8.5*  --   MG  --   --  1.4*  --   --  2.5*  --  2.0  --   --   --   --  1.8  --   PHOS  --  3.8  --   --   --   --   --   --   --   --   --   --   --   --      < > = values in this interval not displayed.   Liver Function Tests: No results for input(s): AST, ALT, ALKPHOS, BILITOT, PROT, ALBUMIN in the last 168 hours. Coagulation Profile: No results for input(s): INR, PROTIME in the last 168 hours. HbA1C: No results for input(s): HGBA1C in the last 72 hours. CBG: Recent Labs  Lab 05/31/20 1112 05/31/20 1632 05/31/20 2108 06/01/20 0738 06/01/20 0742  GLUCAP 146* 145* 152* 303* 146*    Recent Results (from the past 240 hour(s))  SARS Coronavirus 2 by RT PCR (hospital order, performed in [facility] hospital lab) Nasopharyngeal Nasopharyngeal Swab     Status: None   Collection Time: 05/29/20  5:29 AM   Specimen: Nasopharyngeal Swab  Result Value Ref Range Status   SARS Coronavirus 2 NEGATIVE NEGATIVE Final    Comment: (NOTE) SARS-CoV-2 target nucleic acids are NOT DETECTED. The SARS-CoV-2 RNA is generally detectable in upper and lower respiratory specimens during the acute phase of infection. The lowest concentration of SARS-CoV-2 viral copies this assay can detect is 250 copies / mL. A negative result does not preclude SARS-CoV-2 infection and should not be used as the sole basis for treatment or other patient management decisions.  A negative result may occur with improper specimen collection / handling, submission of specimen other than nasopharyngeal swab, presence of viral mutation(s) within the areas targeted by this assay, and inadequate number of viral copies (<250 copies / mL). A negative result must be combined with clinical observations, patient history, and epidemiological information. Fact Sheet for Patients:   StrictlyIdeas.no Fact Sheet for Healthcare Providers: BankingDealers.co.za This test is not yet approved or cleared  by the [place] FDA and has been authorized for detection and/or diagnosis of SARS-CoV-2 by FDA under an Emergency Use Authorization (EUA).   This EUA will remain in effect (meaning this test can be used) for the duration of the COVID-19 declaration under Section 564(b)(1) of the Act, 21 U.S.C. section 360bbb-3(b)(1), unless the authorization is terminated or revoked sooner. Performed at [facility], [place] [address] [place], [place] [address]      Radiology Studies: No results found.  Marzetta Board, MD, PhD [facility]  Between 7 am - 7 pm I am available, please contact me via Amion or Securechat  Between 7 pm - 7 am I am not available, please contact night coverage MD/APP via Amion

## 2020-06-02 DIAGNOSIS — I509 Heart failure, unspecified: Secondary | ICD-10-CM

## 2020-06-02 DIAGNOSIS — I5022 Chronic systolic (congestive) heart failure: Secondary | ICD-10-CM

## 2020-06-02 LAB — BASIC METABOLIC PANEL
Anion gap: 9 (ref 5–15)
BUN: 33 mg/dL — ABNORMAL HIGH (ref 6–20)
CO2: 23 mmol/L (ref 22–32)
Calcium: 8.1 mg/dL — ABNORMAL LOW (ref 8.9–10.3)
Chloride: 91 mmol/L — ABNORMAL LOW (ref 98–111)
Creatinine, Ser: 1.58 mg/dL — ABNORMAL HIGH (ref 0.61–1.24)
GFR calc Af Amer: 54 mL/min — ABNORMAL LOW (ref >60)
GFR calc non Af Amer: 47 mL/min — ABNORMAL LOW (ref >60)
Glucose, Bld: 138 mg/dL — ABNORMAL HIGH (ref 70–99)
Potassium: 4.3 mmol/L (ref 3.5–5.1)
Sodium: 123 mmol/L — ABNORMAL LOW (ref 135–145)

## 2020-06-02 LAB — GLUCOSE, CAPILLARY
Glucose-Capillary: 139 mg/dL — ABNORMAL HIGH (ref 70–99)
Glucose-Capillary: 179 mg/dL — ABNORMAL HIGH (ref 70–99)
Glucose-Capillary: 124 mg/dL — ABNORMAL HIGH (ref 70–99)
Glucose-Capillary: 146 mg/dL — ABNORMAL HIGH (ref 70–99)

## 2020-06-02 LAB — SODIUM
Sodium: 126 mmol/L — ABNORMAL LOW (ref 135–145)
Sodium: 127 mmol/L — ABNORMAL LOW (ref 135–145)
Sodium: 128 mmol/L — ABNORMAL LOW (ref 135–145)

## 2020-06-02 LAB — MAGNESIUM: Magnesium: 1.9 mg/dL (ref 1.7–2.4)

## 2020-06-02 MED ORDER — TRAMADOL HCL 50 MG PO TABS
25.0000 mg | ORAL_TABLET | Freq: Four times a day (QID) | ORAL | Status: DC | PRN
  Administered 2020-06-02 – 2020-06-03 (×3): 25 mg via ORAL
  Filled 2020-06-02 (×3): qty 1

## 2020-06-02 MED ORDER — TOLVAPTAN 15 MG PO TABS
30.0000 mg | ORAL_TABLET | ORAL | Status: AC
  Administered 2020-06-02 – 2020-06-03 (×2): 30 mg via ORAL
  Filled 2020-06-02 (×2): qty 2

## 2020-06-02 MED ORDER — MAGNESIUM SULFATE 2 GM/50ML IV SOLN
2.0000 g | Freq: Once | INTRAVENOUS | Status: AC
  Administered 2020-06-02: 2 g via INTRAVENOUS
  Filled 2020-06-02: qty 50

## 2020-06-02 NOTE — Progress Notes (Signed)
PROGRESS NOTE  Brad [name] XTG:[id number] DOB: 12/01/1961 DOA: 05/29/2020 PCP: Charlott Rakes, MD   LOS: 4 days   Brief Narrative / Interim history: 59 year old male with history of A. fib, chronic kidney disease stage IIIa with baseline creatinine around 1.3-1.4, prior drug use but nothing currently, alcohol abuse came into the hospital with shortness of breath, cough, lower extremity edema and orthopnea for 1 to 2 days.  He was found to be fluid overloaded, hyponatremic with sodium of 117 and was admitted to the hospital.  Chest x-ray on admission showed cardiomegaly, bilateral hilar vascular congestion.  He was started on IV furosemide without significant improvement, had bradycardia when amiodarone and digoxin were started and these were successfully stopped.  He was eventually transferred to [facility] for heart failure team evaluation.  Subjective / 24h Interval events: Complains of mild chest pain, no shortness of breath, no abdominal pain, no nausea or vomiting  Assessment & Plan: Principal Problem Acute on chronic systolic CHF, nonischemic cardiomyopathy -Most recent EF 35-40% in April 2021 with global hypokinesis of the left ventricle.  Volume up, continue IV diuresis.  Heart failure following, management per Dr. Haroldine Laws  Active Problems Hypervolemic hyponatremia in the setting of underlying CHF and beer use -Sodium gradually improving to 126.  Repeat tolvaptan  Acute kidney injury on chronic kidney disease stage IIIa -Baseline creatinine 1.3-1.4, stable with diuresis, 1.5 this morning  Persistent atrial fibrillation with slow ventricular response -Holding of digoxin, amiodarone.  Off anticoagulation due to fall risk/alcohol abuse  Hyperlipidemia -continue atorvastatin  Liver cirrhosis -Due to chronic hep C and alcoholic liver disease, this is contributing to hyponatremia as well  Alcohol use -No clinical signs of withdrawals  DM2 -Continue sliding  scale  CBG (last 3)  Recent Labs    06/01/20 1625 06/01/20 2150 06/02/20 0735  GLUCAP 161* 160* 139*    Scheduled Meds: . atorvastatin  20 mg Oral q1800  . DULoxetine  60 mg Oral Daily  . enoxaparin (LOVENOX) injection  40 mg Subcutaneous Q24H  . folic acid  1 mg Oral Daily  . furosemide  80 mg Intravenous Q12H  . gabapentin  600 mg Oral QHS  . insulin aspart  0-5 Units Subcutaneous QHS  . insulin aspart  0-9 Units Subcutaneous TID WC  . isosorbide-hydrALAZINE  0.5 tablet Oral TID  . lidocaine  1 patch Transdermal Q24H  . multivitamin with minerals  1 tablet Oral Daily  . sodium chloride flush  3 mL Intravenous Once  . spironolactone  25 mg Oral Daily  . thiamine  100 mg Oral Daily  . tolvaptan  30 mg Oral Q24H   Continuous Infusions:  PRN Meds:.acetaminophen, albuterol, LORazepam, oxyCODONE, traMADol  DVT prophylaxis: Lovenox Code Status: Full code Family Communication: no family at bedside   Status is: Inpatient  Remains inpatient appropriate because:Inpatient level of care appropriate due to severity of illness   Dispo: The patient is from: Home              Anticipated d/c is to: Home              Anticipated d/c date is: 2 days              Patient currently is not medically stable to d/c.  Consultants:  Cardiology   Procedures:  none  Microbiology  None   Antimicrobials: None     Objective: Vitals:   06/02/20 0531 06/02/20 0550 06/02/20 0740 06/02/20 1100  BP:  140/85  124/76 112/83  Pulse: 71 (!) 55 66 60  Resp:   20   Temp:   97.9 F (36.6 C) 97.6 F (36.4 C)  TempSrc:   Oral Oral  SpO2: 93%  96%   Weight:      Height:        Intake/Output Summary (Last 24 hours) at 06/02/2020 1230 Last data filed at 06/02/2020 0914 Gross per 24 hour  Intake 820 ml  Output 1180 ml  Net -360 ml   Filed Weights   05/31/20 1616 06/01/20 0432 06/02/20 0256  Weight: 80.3 kg 81.8 kg 81.5 kg    Examination:  Constitutional: No distress Eyes: No  icterus ENMT: mmm Neck: normal, supple Respiratory: Diminished at the bases but no wheezing, no rhonchi.  Moves air well. Cardiovascular: Irregular, no murmurs.  Trace edema Abdomen: Soft, ND, NT, bowel sounds positive Musculoskeletal: no clubbing / cyanosis.  Skin: No rashes appreciated Neurologic: No focal deficits   Data Reviewed: I have independently reviewed following labs and imaging studies   CBC: Recent Labs  Lab 05/29/20 0432 05/31/20 0424  WBC 3.7* 2.6*  NEUTROABS  --  1.8  HGB 11.5* 10.5*  HCT 33.2* 30.7*  MCV 87.8 89.5  PLT 192 093*   Basic Metabolic Panel: Recent Labs  Lab 05/29/20 0645 05/29/20 0754 05/29/20 0958 05/30/20 0432 05/30/20 0432 05/31/20 0424 05/31/20 0918 05/31/20 1334 05/31/20 1334 06/01/20 0609 06/01/20 0749 06/01/20 1408 06/02/20 0006 06/02/20 0820  NA  --   --    < > 120*   < > 117*   < > 120*   < > 122* 121* 123* 123* 126*  K  --   --    < > 4.2  --  4.0  --  3.8  --  4.1  --   --  4.3  --   CL  --   --    < > 88*  --  85*  --  86*  --  89*  --   --  91*  --   CO2  --   --    < > 21*  --  19*  --  22  --  24  --   --  23  --   GLUCOSE  --   --    < > 116*  --  151*  --  148*  --  134*  --   --  138*  --   BUN  --   --    < > 25*  --  31*  --  32*  --  32*  --   --  33*  --   CREATININE  --   --    < > 1.24  --  1.58*  --  1.61*  --  1.73*  --   --  1.58*  --   CALCIUM  --   --    < > 8.1*  --  8.0*  --  7.8*  --  8.5*  --   --  8.1*  --   MG  --  1.4*  --  2.5*  --  2.0  --   --   --  1.8  --   --  1.9  --   PHOS 3.8  --   --   --   --   --   --   --   --   --   --   --   --   --    < > =  values in this interval not displayed.   Liver Function Tests: No results for input(s): AST, ALT, ALKPHOS, BILITOT, PROT, ALBUMIN in the last 168 hours. Coagulation Profile: No results for input(s): INR, PROTIME in the last 168 hours. HbA1C: No results for input(s): HGBA1C in the last 72 hours. CBG: Recent Labs  Lab 06/01/20 0742  06/01/20 1104 06/01/20 1625 06/01/20 2150 06/02/20 0735  GLUCAP 146* 152* 161* 160* 139*    Recent Results (from the past 240 hour(s))  SARS Coronavirus 2 by RT PCR (hospital order, performed in [facility] hospital lab) Nasopharyngeal Nasopharyngeal Swab     Status: None   Collection Time: 05/29/20  5:29 AM   Specimen: Nasopharyngeal Swab  Result Value Ref Range Status   SARS Coronavirus 2 NEGATIVE NEGATIVE Final    Comment: (NOTE) SARS-CoV-2 target nucleic acids are NOT DETECTED. The SARS-CoV-2 RNA is generally detectable in upper and lower respiratory specimens during the acute phase of infection. The lowest concentration of SARS-CoV-2 viral copies this assay can detect is 250 copies / mL. A negative result does not preclude SARS-CoV-2 infection and should not be used as the sole basis for treatment or other patient management decisions.  A negative result may occur with improper specimen collection / handling, submission of specimen other than nasopharyngeal swab, presence of viral mutation(s) within the areas targeted by this assay, and inadequate number of viral copies (<250 copies / mL). A negative result must be combined with clinical observations, patient history, and epidemiological information. Fact Sheet for Patients:   StrictlyIdeas.no Fact Sheet for Healthcare Providers: BankingDealers.co.za This test is not yet approved or cleared  by the [place] FDA and has been authorized for detection and/or diagnosis of SARS-CoV-2 by FDA under an Emergency Use Authorization (EUA).  This EUA will remain in effect (meaning this test can be used) for the duration of the COVID-19 declaration under Section 564(b)(1) of the Act, 21 U.S.C. section 360bbb-3(b)(1), unless the authorization is terminated or revoked sooner. Performed at [facility], [place] [address], [place], [place] [address]      Radiology  Studies: No results found.  Marzetta Board, MD, PhD [facility]  Between 7 am - 7 pm I am available, please contact me via Amion or Securechat  Between 7 pm - 7 am I am not available, please contact night coverage MD/APP via Amion

## 2020-06-02 NOTE — Progress Notes (Signed)
Advanced Heart Failure Rounding Note  PCP-Cardiologist: [name], MD    Patient Profile   59 y/o male with polysubstance abuse (ETOH, cocaine tobacco), cirrhosis, permanent AF, hyponatremia and systolic HF due to NICM EF 20-25% in past (most recently 35-40% in 4/21). Readmitted with CP, a/c systolic HF and recurrent hyponatremia.   Subjective:    NA 117 on admit 2/2 CHF + beer potamania.   Getting IV lasix and tolvaptan. Na 121-> 123. Weight down 1 pound. Creatinine stable/improved at  1.58  Denies SOB, orthopnea or PND. Feels bloated  Objective:   Weight Range: 81.5 kg Body mass index is 25.06 kg/m.   Vital Signs:   Temp:  [97.7 F (36.5 C)-98 F (36.7 C)] 97.9 F (36.6 C) (06/12 0740) Pulse Rate:  [55-71] 66 (06/12 0740) Resp:  [15-20] 20 (06/12 0740) BP: (108-150)/(69-85) 124/76 (06/12 0740) SpO2:  [93 %-100 %] 96 % (06/12 0740) Weight:  [81.5 kg] 81.5 kg (06/12 0256) Last BM Date: 06/01/20  Weight change: Filed Weights   05/31/20 1616 06/01/20 0432 06/02/20 0256  Weight: 80.3 kg 81.8 kg 81.5 kg    Intake/Output:   Intake/Output Summary (Last 24 hours) at 06/02/2020 0839 Last data filed at 06/02/2020 0626 Gross per 24 hour  Intake 600 ml  Output 1580 ml  Net -980 ml      Physical Exam    General:  Well appearing. No resp difficulty HEENT: normal x poor dentition Neck: supple. no JVD. Carotids 2+ bilat; no bruits. No lymphadenopathy or thryomegaly appreciated. Cor: PMI nondisplaced. Iiegular rate & rhythm. No rubs, gallops or murmurs. Lungs: clear Abdomen: soft, nontender, nondistended. No hepatosplenomegaly. No bruits or masses. Good bowel sounds. Extremities: no cyanosis, clubbing, rash, edema Neuro: alert & orientedx3, cranial nerves grossly intact. moves all 4 extremities w/o difficulty. Affect pleasant   Telemetry   Chronic afib, rate controlled 60s + PVCs Personally reviewed  Labs    CBC Recent Labs    05/31/20 0424  WBC  2.6*  NEUTROABS 1.8  HGB 10.5*  HCT 30.7*  MCV 89.5  PLT 867*   Basic Metabolic Panel Recent Labs    06/01/20 0609 06/01/20 0749 06/01/20 1408 06/02/20 0006  NA 122*   < > 123* 123*  K 4.1  --   --  4.3  CL 89*  --   --  91*  CO2 24  --   --  23  GLUCOSE 134*  --   --  138*  BUN 32*  --   --  33*  CREATININE 1.73*  --   --  1.58*  CALCIUM 8.5*  --   --  8.1*  MG 1.8  --   --  1.9   < > = values in this interval not displayed.   Liver Function Tests No results for input(s): AST, ALT, ALKPHOS, BILITOT, PROT, ALBUMIN in the last 72 hours. No results for input(s): LIPASE, AMYLASE in the last 72 hours. Cardiac Enzymes No results for input(s): CKTOTAL, CKMB, CKMBINDEX, TROPONINI in the last 72 hours.  BNP: BNP (last 3 results) Recent Labs    05/03/20 1432 05/10/20 1225 05/29/20 0430  BNP 1,538.7* 745.9* 678.3*    ProBNP (last 3 results) No results for input(s): PROBNP in the last 8760 hours.   D-Dimer No results for input(s): DDIMER in the last 72 hours. Hemoglobin A1C No results for input(s): HGBA1C in the last 72 hours. Fasting Lipid Panel No results for input(s): CHOL, HDL, LDLCALC, TRIG,  CHOLHDL, LDLDIRECT in the last 72 hours. Thyroid Function Tests No results for input(s): TSH, T4TOTAL, T3FREE, THYROIDAB in the last 72 hours.  Invalid input(s): FREET3  Other results:   Imaging    No results found.   Medications:     Scheduled Medications: . atorvastatin  20 mg Oral q1800  . DULoxetine  60 mg Oral Daily  . enoxaparin (LOVENOX) injection  40 mg Subcutaneous Q24H  . folic acid  1 mg Oral Daily  . furosemide  80 mg Intravenous Q12H  . gabapentin  600 mg Oral QHS  . insulin aspart  0-5 Units Subcutaneous QHS  . insulin aspart  0-9 Units Subcutaneous TID WC  . isosorbide-hydrALAZINE  0.5 tablet Oral TID  . lidocaine  1 patch Transdermal Q24H  . multivitamin with minerals  1 tablet Oral Daily  . sodium chloride flush  3 mL Intravenous Once  .  spironolactone  25 mg Oral Daily  . thiamine  100 mg Oral Daily    Infusions:   PRN Medications: acetaminophen, albuterol, LORazepam, oxyCODONE    Assessment/Plan   1. A/C Systolic Heart Failure  - ECHO 2021 EF 35-40% . NICM  - Volume status remains elevated - Continue IV lasix and tolvaptan - Can consider RHC as needed  2. Hyponatremia -Sodium 120>117>>122-> 123 - due to HF/beer potamania - Received dose of tolvaptan 6/10, 6/11 - Will give another dose of 30 mg of tolvaptan today - Restrict Free Water   3. Chest  Pain  - Doubt ACS.  - HS Trop 27>.31  4. A fib, permanent  - No bb with bradycardia. Rate controlled.  - no anticoagulant with ETOH abuse and falls.   5. ETOH Abuse  - Discussed alcohol cessation. Says he is cutting back  6. PVCs -Has not been on amiodarone /digoxin due to bradycardia.  - <10 PVCs/hr overnight, - continue to monitor on tele - Keep K > 4.0 Mg > 2.0  7. AKI -Creatinine trending up 1.19>>1.24>>1.58>>1.61>>1.73 > 1.58 -Continue to follow. RHC as needed  Length of Stay: [place], MD  06/02/2020, 8:39 AM  Advanced Heart Failure Team Pager [phone] (M-F; 7a - 4p)  Please contact [facility] for night-coverage after hours (4p -7a ) and weekends on [email or web address]

## 2020-06-03 LAB — BASIC METABOLIC PANEL
Anion gap: 8 (ref 5–15)
BUN: 35 mg/dL — ABNORMAL HIGH (ref 6–20)
CO2: 26 mmol/L (ref 22–32)
Calcium: 8.6 mg/dL — ABNORMAL LOW (ref 8.9–10.3)
Chloride: 98 mmol/L (ref 98–111)
Creatinine, Ser: 1.76 mg/dL — ABNORMAL HIGH (ref 0.61–1.24)
GFR calc Af Amer: 48 mL/min — ABNORMAL LOW (ref >60)
GFR calc non Af Amer: 41 mL/min — ABNORMAL LOW (ref >60)
Glucose, Bld: 133 mg/dL — ABNORMAL HIGH (ref 70–99)
Potassium: 4.5 mmol/L (ref 3.5–5.1)
Sodium: 132 mmol/L — ABNORMAL LOW (ref 135–145)

## 2020-06-03 LAB — GLUCOSE, CAPILLARY
Glucose-Capillary: 131 mg/dL — ABNORMAL HIGH (ref 70–99)
Glucose-Capillary: 135 mg/dL — ABNORMAL HIGH (ref 70–99)
Glucose-Capillary: 121 mg/dL — ABNORMAL HIGH (ref 70–99)
Glucose-Capillary: 193 mg/dL — ABNORMAL HIGH (ref 70–99)

## 2020-06-03 LAB — MAGNESIUM: Magnesium: 2 mg/dL (ref 1.7–2.4)

## 2020-06-03 LAB — SODIUM: Sodium: 133 mmol/L — ABNORMAL LOW (ref 135–145)

## 2020-06-03 MED ORDER — SODIUM CHLORIDE 0.9% FLUSH: 3.0000 mL | Freq: Two times a day (BID) | INTRAVENOUS | Status: DC

## 2020-06-03 MED ORDER — SODIUM CHLORIDE 0.9 % IV SOLN: INTRAVENOUS | Status: DC

## 2020-06-03 MED ORDER — SODIUM CHLORIDE 0.9 % IV SOLN: 250.0000 mL | INTRAVENOUS | Status: DC | PRN

## 2020-06-03 MED ORDER — ASPIRIN 81 MG PO CHEW
81.0000 mg | CHEWABLE_TABLET | ORAL | Status: AC
  Administered 2020-06-04: 81 mg via ORAL
  Filled 2020-06-03: qty 1

## 2020-06-03 MED ORDER — MEXILETINE HCL 200 MG PO CAPS
200.0000 mg | ORAL_CAPSULE | Freq: Two times a day (BID) | ORAL | Status: DC
  Administered 2020-06-03 – 2020-06-05 (×5): 200 mg via ORAL
  Filled 2020-06-03 (×6): qty 1

## 2020-06-03 MED ORDER — SODIUM CHLORIDE 0.9% FLUSH: 3.0000 mL | INTRAVENOUS | Status: DC | PRN

## 2020-06-03 NOTE — Progress Notes (Signed)
Advanced Heart Failure Rounding Note  PCP-Cardiologist: [name], MD    Patient Profile   59 y/o male with polysubstance abuse (ETOH, cocaine tobacco), cirrhosis, permanent AF, hyponatremia and systolic HF due to NICM EF 20-25% in past (most recently 35-40% in 4/21). Readmitted with CP, a/c systolic HF and recurrent hyponatremia.   Subjective:    NA 117 on admit 2/2 CHF + beer potamania.   Getting IV lasix and tolvaptan. Na 121-> 123 -> 132 . Weight down another 2 pounds. Creatinine up to 1.7  Denies SOB orthopnea or PND. He insists he is still swollen.   Objective:   Weight Range: 80.3 kg Body mass index is 24.69 kg/m.   Vital Signs:   Temp:  [97.6 F (36.4 C)-98.3 F (36.8 C)] 97.9 F (36.6 C) (06/13 0741) Pulse Rate:  [60-79] 66 (06/13 0741) Resp:  [16-20] 16 (06/13 0741) BP: (106-131)/(62-93) 125/62 (06/13 0741) SpO2:  [95 %-100 %] 100 % (06/13 0741) Weight:  [80.3 kg] 80.3 kg (06/13 0539) Last BM Date: 06/01/20  Weight change: Filed Weights   06/01/20 0432 06/02/20 0256 06/03/20 0539  Weight: 81.8 kg 81.5 kg 80.3 kg    Intake/Output:   Intake/Output Summary (Last 24 hours) at 06/03/2020 0857 Last data filed at 06/03/2020 0700 Gross per 24 hour  Intake 1218 ml  Output 3550 ml  Net -2332 ml      Physical Exam    General:  Sitting up in bed. No resp difficulty HEENT: normal Neck: supple. JVP 6 Carotids 2+ bilat; no bruits. No lymphadenopathy or thryomegaly appreciated. Cor: PMI nondisplaced. Irregular rate & rhythm. No rubs, gallops or murmurs. Lungs: clear Abdomen: soft, nontender, nondistended. No hepatosplenomegaly. No bruits or masses. Good bowel sounds. Extremities: no cyanosis, clubbing, rash, edema Neuro: alert & orientedx3, cranial nerves grossly intact. moves all 4 extremities w/o difficulty. Affect pleasant  Telemetry   AF  rate controlled 60s + PVCs (~ 10/hr) Personally reviewed  Labs    CBC No results for input(s): WBC,  NEUTROABS, HGB, HCT, MCV, PLT in the last 72 hours. Basic Metabolic Panel Recent Labs    06/02/20 0006 06/02/20 0820 06/02/20 2258 06/03/20 0750  NA 123*   < > 128* 132*  K 4.3  --   --  4.5  CL 91*  --   --  98  CO2 23  --   --  26  GLUCOSE 138*  --   --  133*  BUN 33*  --   --  35*  CREATININE 1.58*  --   --  1.76*  CALCIUM 8.1*  --   --  8.6*  MG 1.9  --   --  2.0   < > = values in this interval not displayed.   Liver Function Tests No results for input(s): AST, ALT, ALKPHOS, BILITOT, PROT, ALBUMIN in the last 72 hours. No results for input(s): LIPASE, AMYLASE in the last 72 hours. Cardiac Enzymes No results for input(s): CKTOTAL, CKMB, CKMBINDEX, TROPONINI in the last 72 hours.  BNP: BNP (last 3 results) Recent Labs    05/03/20 1432 05/10/20 1225 05/29/20 0430  BNP 1,538.7* 745.9* 678.3*    ProBNP (last 3 results) No results for input(s): PROBNP in the last 8760 hours.   D-Dimer No results for input(s): DDIMER in the last 72 hours. Hemoglobin A1C No results for input(s): HGBA1C in the last 72 hours. Fasting Lipid Panel No results for input(s): CHOL, HDL, LDLCALC, TRIG, CHOLHDL, LDLDIRECT in the  last 72 hours. Thyroid Function Tests No results for input(s): TSH, T4TOTAL, T3FREE, THYROIDAB in the last 72 hours.  Invalid input(s): FREET3  Other results:   Imaging    No results found.   Medications:     Scheduled Medications: . atorvastatin  20 mg Oral q1800  . DULoxetine  60 mg Oral Daily  . enoxaparin (LOVENOX) injection  40 mg Subcutaneous Q24H  . folic acid  1 mg Oral Daily  . furosemide  80 mg Intravenous Q12H  . gabapentin  600 mg Oral QHS  . insulin aspart  0-5 Units Subcutaneous QHS  . insulin aspart  0-9 Units Subcutaneous TID WC  . isosorbide-hydrALAZINE  0.5 tablet Oral TID  . lidocaine  1 patch Transdermal Q24H  . multivitamin with minerals  1 tablet Oral Daily  . sodium chloride flush  3 mL Intravenous Once  . spironolactone  25  mg Oral Daily  . thiamine  100 mg Oral Daily  . tolvaptan  30 mg Oral Q24H    Infusions:   PRN Medications: acetaminophen, albuterol, LORazepam, oxyCODONE, traMADol    Assessment/Plan   1. A/C Systolic Heart Failure  - ECHO 2021 EF 35-40% . NICM  - Volume status much improved to my exam but he insists he is still swollen  - Creatinine up. Suspect this is about as good as we can get him with IV lasix - Will plan R/L cath tomorrow to further evaluate - Hold further lasix - continue bidil (this is new) and spiro. - off dig with AKI - off b-blocker with bradycardia - no ACE/ARB/ARNI due to angioedema  2. Hyponatremia -Sodium 120>117>>122-> 123-> 128 -> 132 - due to HF/beer potamania - Received dose of tolvaptan 6/10, 6/11, 6/12 - Stressed need to avoid beer and restrict free water - [employer]   3. Chest  Pain  - Doubt ACS.  - HS Trop 27>.31  4. A fib, permanent  - Rate controlled. Amio stopped due to not candidate for DC-CV - no anticoagulant with ETOH abuse and falls.   5. ETOH Abuse  - Discussed alcohol cessation. Says he is cutting back  6. PVCs -Has not been on amiodarone /digoxin due to bradycardia.  - PVCs ~10/hr (often more) > 10%. Was on amio prior to admit and PVCs not suppressed. Will switch to mexilitene 200 bid - continue to monitor on tele - Keep K > 4.0 Mg > 2.0  7. AKI -Creatinine trending up 1.19>>1.24>>1.58>>1.61>>1.73 > 1.58 > 1.76 - Hold diuretics today  Length of Stay: [place], MD  06/03/2020, 8:57 AM  Advanced Heart Failure Team Pager [phone] (M-F; 7a - 4p)  Please contact [facility] for night-coverage after hours (4p -7a ) and weekends on [email or web address]

## 2020-06-03 NOTE — H&P (View-Only) (Signed)
Advanced Heart Failure Rounding Note  PCP-Cardiologist: [name], MD    Patient Profile   59 y/o male with polysubstance abuse (ETOH, cocaine tobacco), cirrhosis, permanent AF, hyponatremia and systolic HF due to NICM EF 20-25% in past (most recently 35-40% in 4/21). Readmitted with CP, a/c systolic HF and recurrent hyponatremia.   Subjective:    NA 117 on admit 2/2 CHF + beer potamania.   Getting IV lasix and tolvaptan. Na 121-> 123 -> 132 . Weight down another 2 pounds. Creatinine up to 1.7  Denies SOB orthopnea or PND. He insists he is still swollen.   Objective:   Weight Range: 80.3 kg Body mass index is 24.69 kg/m.   Vital Signs:   Temp:  [97.6 F (36.4 C)-98.3 F (36.8 C)] 97.9 F (36.6 C) (06/13 0741) Pulse Rate:  [60-79] 66 (06/13 0741) Resp:  [16-20] 16 (06/13 0741) BP: (106-131)/(62-93) 125/62 (06/13 0741) SpO2:  [95 %-100 %] 100 % (06/13 0741) Weight:  [80.3 kg] 80.3 kg (06/13 0539) Last BM Date: 06/01/20  Weight change: Filed Weights   06/01/20 0432 06/02/20 0256 06/03/20 0539  Weight: 81.8 kg 81.5 kg 80.3 kg    Intake/Output:   Intake/Output Summary (Last 24 hours) at 06/03/2020 0857 Last data filed at 06/03/2020 0700 Gross per 24 hour  Intake 1218 ml  Output 3550 ml  Net -2332 ml      Physical Exam    General:  Sitting up in bed. No resp difficulty HEENT: normal Neck: supple. JVP 6 Carotids 2+ bilat; no bruits. No lymphadenopathy or thryomegaly appreciated. Cor: PMI nondisplaced. Irregular rate & rhythm. No rubs, gallops or murmurs. Lungs: clear Abdomen: soft, nontender, nondistended. No hepatosplenomegaly. No bruits or masses. Good bowel sounds. Extremities: no cyanosis, clubbing, rash, edema Neuro: alert & orientedx3, cranial nerves grossly intact. moves all 4 extremities w/o difficulty. Affect pleasant  Telemetry   AF  rate controlled 60s + PVCs (~ 10/hr) Personally reviewed  Labs    CBC No results for input(s): WBC,  NEUTROABS, HGB, HCT, MCV, PLT in the last 72 hours. Basic Metabolic Panel Recent Labs    06/02/20 0006 06/02/20 0820 06/02/20 2258 06/03/20 0750  NA 123*   < > 128* 132*  K 4.3  --   --  4.5  CL 91*  --   --  98  CO2 23  --   --  26  GLUCOSE 138*  --   --  133*  BUN 33*  --   --  35*  CREATININE 1.58*  --   --  1.76*  CALCIUM 8.1*  --   --  8.6*  MG 1.9  --   --  2.0   < > = values in this interval not displayed.   Liver Function Tests No results for input(s): AST, ALT, ALKPHOS, BILITOT, PROT, ALBUMIN in the last 72 hours. No results for input(s): LIPASE, AMYLASE in the last 72 hours. Cardiac Enzymes No results for input(s): CKTOTAL, CKMB, CKMBINDEX, TROPONINI in the last 72 hours.  BNP: BNP (last 3 results) Recent Labs    05/03/20 1432 05/10/20 1225 05/29/20 0430  BNP 1,538.7* 745.9* 678.3*    ProBNP (last 3 results) No results for input(s): PROBNP in the last 8760 hours.   D-Dimer No results for input(s): DDIMER in the last 72 hours. Hemoglobin A1C No results for input(s): HGBA1C in the last 72 hours. Fasting Lipid Panel No results for input(s): CHOL, HDL, LDLCALC, TRIG, CHOLHDL, LDLDIRECT in the  last 72 hours. Thyroid Function Tests No results for input(s): TSH, T4TOTAL, T3FREE, THYROIDAB in the last 72 hours.  Invalid input(s): FREET3  Other results:   Imaging    No results found.   Medications:     Scheduled Medications: . atorvastatin  20 mg Oral q1800  . DULoxetine  60 mg Oral Daily  . enoxaparin (LOVENOX) injection  40 mg Subcutaneous Q24H  . folic acid  1 mg Oral Daily  . furosemide  80 mg Intravenous Q12H  . gabapentin  600 mg Oral QHS  . insulin aspart  0-5 Units Subcutaneous QHS  . insulin aspart  0-9 Units Subcutaneous TID WC  . isosorbide-hydrALAZINE  0.5 tablet Oral TID  . lidocaine  1 patch Transdermal Q24H  . multivitamin with minerals  1 tablet Oral Daily  . sodium chloride flush  3 mL Intravenous Once  . spironolactone  25  mg Oral Daily  . thiamine  100 mg Oral Daily  . tolvaptan  30 mg Oral Q24H    Infusions:   PRN Medications: acetaminophen, albuterol, LORazepam, oxyCODONE, traMADol    Assessment/Plan   1. A/C Systolic Heart Failure  - ECHO 2021 EF 35-40% . NICM  - Volume status much improved to my exam but he insists he is still swollen  - Creatinine up. Suspect this is about as good as we can get him with IV lasix - Will plan R/L cath tomorrow to further evaluate - Hold further lasix - continue bidil (this is new) and spiro. - off dig with AKI - off b-blocker with bradycardia - no ACE/ARB/ARNI due to angioedema  2. Hyponatremia -Sodium 120>117>>122-> 123-> 128 -> 132 - due to HF/beer potamania - Received dose of tolvaptan 6/10, 6/11, 6/12 - Stressed need to avoid beer and restrict free water - [employer]   3. Chest  Pain  - Doubt ACS.  - HS Trop 27>.31  4. A fib, permanent  - Rate controlled. Amio stopped due to not candidate for DC-CV - no anticoagulant with ETOH abuse and falls.   5. ETOH Abuse  - Discussed alcohol cessation. Says he is cutting back  6. PVCs -Has not been on amiodarone /digoxin due to bradycardia.  - PVCs ~10/hr (often more) > 10%. Was on amio prior to admit and PVCs not suppressed. Will switch to mexilitene 200 bid - continue to monitor on tele - Keep K > 4.0 Mg > 2.0  7. AKI -Creatinine trending up 1.19>>1.24>>1.58>>1.61>>1.73 > 1.58 > 1.76 - Hold diuretics today  Length of Stay: [place], MD  06/03/2020, 8:57 AM  Advanced Heart Failure Team Pager [phone] (M-F; 7a - 4p)  Please contact [facility] for night-coverage after hours (4p -7a ) and weekends on [email or web address]

## 2020-06-03 NOTE — Plan of Care (Signed)
  Problem: Education: Goal: Knowledge of General Education information will improve Description: Including pain rating scale, medication(s)/side effects and non-pharmacologic comfort measures Outcome: Progressing   Problem: Health Behavior/Discharge Planning: Goal: Ability to manage health-related needs will improve Outcome: Progressing   Problem: Clinical Measurements: Goal: Diagnostic test results will improve Outcome: Progressing   Problem: Activity: Goal: Risk for activity intolerance will decrease Outcome: Progressing   Problem: Nutrition: Goal: Adequate nutrition will be maintained Outcome: Completed/Met   Problem: Coping: Goal: Level of anxiety will decrease Outcome: Completed/Met   Problem: Elimination: Goal: Will not experience complications related to bowel motility Outcome: Completed/Met Goal: Will not experience complications related to urinary retention Outcome: Completed/Met

## 2020-06-03 NOTE — Progress Notes (Signed)
PROGRESS NOTE  Brad [name] PXT:[id number] DOB: 1961/01/28 DOA: 05/29/2020 PCP: Charlott Rakes, MD   LOS: 5 days   Brief Narrative / Interim history: 59 year old male with history of A. fib, chronic kidney disease stage IIIa with baseline creatinine around 1.3-1.4, prior drug use but nothing currently, alcohol abuse came into the hospital with shortness of breath, cough, lower extremity edema and orthopnea for 1 to 2 days.  He was found to be fluid overloaded, hyponatremic with sodium of 117 and was admitted to the hospital.  Chest x-ray on admission showed cardiomegaly, bilateral hilar vascular congestion.  He was started on IV furosemide without significant improvement, had bradycardia when amiodarone and digoxin were started and these were successfully stopped.  He was eventually transferred to [facility] for heart failure team evaluation.  Subjective / 24h Interval events: Remains with chest pain, feels like he still has little fluid in his legs and complains of cough productive of white frothy sputum  Assessment & Plan: Principal Problem Acute on chronic systolic CHF, nonischemic cardiomyopathy -Most recent EF 35-40% in April 2021 with global hypokinesis of the left ventricle.  Still appears to have some lower extremity swelling.  Discussed with Dr. Haroldine Laws at bedside.  Obtain right heart cath tomorrow.  Hold Lasix following this morning's dose.  Active Problems Hypervolemic hyponatremia in the setting of underlying CHF and beer use -Sodium gradually improving now 132.  Patient advised for dietary compliance as well as avoid beer altogether  Acute kidney injury on chronic kidney disease stage IIIa -Baseline creatinine 1.3-1.4, creatinine 1.7 this morning.  Hold Lasix as above  Persistent atrial fibrillation with slow ventricular response -Holding of digoxin, amiodarone.  Off anticoagulation due to fall risk/alcohol abuse  Hyperlipidemia -continue atorvastatin  Liver  cirrhosis -Due to chronic hep C and alcoholic liver disease, this is contributing to hyponatremia as well  Alcohol use -No clinical signs of withdrawals  DM2 -Continue sliding scale  CBG (last 3)  Recent Labs    06/02/20 1638 06/02/20 2210 06/03/20 0739  GLUCAP 124* 146* 131*    Scheduled Meds: . [START ON 06/04/2020] aspirin  81 mg Oral Pre-Cath  . atorvastatin  20 mg Oral q1800  . DULoxetine  60 mg Oral Daily  . enoxaparin (LOVENOX) injection  40 mg Subcutaneous Q24H  . folic acid  1 mg Oral Daily  . gabapentin  600 mg Oral QHS  . insulin aspart  0-5 Units Subcutaneous QHS  . insulin aspart  0-9 Units Subcutaneous TID WC  . isosorbide-hydrALAZINE  0.5 tablet Oral TID  . lidocaine  1 patch Transdermal Q24H  . mexiletine  200 mg Oral Q12H  . multivitamin with minerals  1 tablet Oral Daily  . sodium chloride flush  3 mL Intravenous Once  . sodium chloride flush  3 mL Intravenous Q12H  . spironolactone  25 mg Oral Daily  . thiamine  100 mg Oral Daily   Continuous Infusions: . sodium chloride    . [START ON 06/04/2020] sodium chloride     PRN Meds:.sodium chloride, acetaminophen, albuterol, LORazepam, oxyCODONE, sodium chloride flush, traMADol  DVT prophylaxis: Lovenox Code Status: Full code Family Communication: no family at bedside   Status is: Inpatient  Remains inpatient appropriate because:Inpatient level of care appropriate due to severity of illness   Dispo: The patient is from: Home              Anticipated d/c is to: Home  Anticipated d/c date is: 2 days              Patient currently is not medically stable to d/c.  Consultants:  Cardiology   Procedures:  none  Microbiology  None   Antimicrobials: None     Objective: Vitals:   06/02/20 2200 06/03/20 0045 06/03/20 0539 06/03/20 0741  BP: 112/67 (!) 131/93 116/67 125/62  Pulse:  63 60 66  Resp:  16 16 16   Temp:  98.2 F (36.8 C) 98.3 F (36.8 C) 97.9 F (36.6 C)  TempSrc:   Oral Oral Oral  SpO2:  98% 99% 100%  Weight:   80.3 kg   Height:        Intake/Output Summary (Last 24 hours) at 06/03/2020 1053 Last data filed at 06/03/2020 0700 Gross per 24 hour  Intake 998 ml  Output 3550 ml  Net -2552 ml   Filed Weights   06/01/20 0432 06/02/20 0256 06/03/20 0539  Weight: 81.8 kg 81.5 kg 80.3 kg    Examination:  Constitutional: NAD Eyes: No scleral icterus ENMT: Moist mucous membranes Neck: normal, supple Respiratory: Diminished at the bases, no wheezing, faint crackles. Cardiovascular: Irregular, no murmurs, trace edema Abdomen: Soft, nontender, nondistended, positive bowel sounds Musculoskeletal: no clubbing / cyanosis.  Skin: No rashes seen Neurologic: No focal deficits   Data Reviewed: I have independently reviewed following labs and imaging studies   CBC: Recent Labs  Lab 05/29/20 0432 05/31/20 0424  WBC 3.7* 2.6*  NEUTROABS  --  1.8  HGB 11.5* 10.5*  HCT 33.2* 30.7*  MCV 87.8 89.5  PLT 192 379*   Basic Metabolic Panel: Recent Labs  Lab 05/29/20 0645 05/29/20 0754 05/30/20 0432 05/30/20 0432 05/31/20 0424 05/31/20 0918 05/31/20 1334 05/31/20 1334 06/01/20 0609 06/01/20 0749 06/02/20 0006 06/02/20 0820 06/02/20 1437 06/02/20 2258 06/03/20 0750  NA  --    < > 120*   < > 117*   < > 120*   < > 122*   < > 123* 126* 127* 128* 132*  K  --    < > 4.2   < > 4.0  --  3.8  --  4.1  --  4.3  --   --   --  4.5  CL  --    < > 88*   < > 85*  --  86*  --  89*  --  91*  --   --   --  98  CO2  --    < > 21*   < > 19*  --  22  --  24  --  23  --   --   --  26  GLUCOSE  --    < > 116*   < > 151*  --  148*  --  134*  --  138*  --   --   --  133*  BUN  --    < > 25*   < > 31*  --  32*  --  32*  --  33*  --   --   --  35*  CREATININE  --    < > 1.24   < > 1.58*  --  1.61*  --  1.73*  --  1.58*  --   --   --  1.76*  CALCIUM  --    < > 8.1*   < > 8.0*  --  7.8*  --  8.5*  --  8.1*  --   --   --  8.6*  MG  --    < > 2.5*  --  2.0  --   --   --   1.8  --  1.9  --   --   --  2.0  PHOS 3.8  --   --   --   --   --   --   --   --   --   --   --   --   --   --    < > = values in this interval not displayed.   Liver Function Tests: No results for input(s): AST, ALT, ALKPHOS, BILITOT, PROT, ALBUMIN in the last 168 hours. Coagulation Profile: No results for input(s): INR, PROTIME in the last 168 hours. HbA1C: No results for input(s): HGBA1C in the last 72 hours. CBG: Recent Labs  Lab 06/02/20 0735 06/02/20 1316 06/02/20 1638 06/02/20 2210 06/03/20 0739  GLUCAP 139* 179* 124* 146* 131*    Recent Results (from the past 240 hour(s))  SARS Coronavirus 2 by RT PCR (hospital order, performed in [facility] hospital lab) Nasopharyngeal Nasopharyngeal Swab     Status: None   Collection Time: 05/29/20  5:29 AM   Specimen: Nasopharyngeal Swab  Result Value Ref Range Status   SARS Coronavirus 2 NEGATIVE NEGATIVE Final    Comment: (NOTE) SARS-CoV-2 target nucleic acids are NOT DETECTED. The SARS-CoV-2 RNA is generally detectable in upper and lower respiratory specimens during the acute phase of infection. The lowest concentration of SARS-CoV-2 viral copies this assay can detect is 250 copies / mL. A negative result does not preclude SARS-CoV-2 infection and should not be used as the sole basis for treatment or other patient management decisions.  A negative result may occur with improper specimen collection / handling, submission of specimen other than nasopharyngeal swab, presence of viral mutation(s) within the areas targeted by this assay, and inadequate number of viral copies (<250 copies / mL). A negative result must be combined with clinical observations, patient history, and epidemiological information. Fact Sheet for Patients:   StrictlyIdeas.no Fact Sheet for Healthcare Providers: BankingDealers.co.za This test is not yet approved or cleared  by the [place] FDA and has been  authorized for detection and/or diagnosis of SARS-CoV-2 by FDA under an Emergency Use Authorization (EUA).  This EUA will remain in effect (meaning this test can be used) for the duration of the COVID-19 declaration under Section 564(b)(1) of the Act, 21 U.S.C. section 360bbb-3(b)(1), unless the authorization is terminated or revoked sooner. Performed at [facility], [place] [address] [place], [place] [address]      Radiology Studies: No results found.  Marzetta Board, MD, PhD [facility]  Between 7 am - 7 pm I am available, please contact me via Amion or Securechat  Between 7 pm - 7 am I am not available, please contact night coverage MD/APP via Amion

## 2020-06-04 ENCOUNTER — Telehealth (HOSPITAL_COMMUNITY): Payer: Self-pay | Admitting: Licensed Clinical Social Worker

## 2020-06-04 ENCOUNTER — Encounter (HOSPITAL_COMMUNITY): Payer: Self-pay | Admitting: Internal Medicine

## 2020-06-04 ENCOUNTER — Encounter (HOSPITAL_COMMUNITY)
Admission: EM | Disposition: A | Discharge status: Home / Self Care | Payer: Self-pay | Source: Home / Self Care | Attending: Internal Medicine

## 2020-06-04 HISTORY — PX: RIGHT/LEFT HEART CATH AND CORONARY ANGIOGRAPHY: CATH118266

## 2020-06-04 LAB — BASIC METABOLIC PANEL
Anion gap: 8 (ref 5–15)
BUN: 34 mg/dL — ABNORMAL HIGH (ref 6–20)
CO2: 26 mmol/L (ref 22–32)
Calcium: 8.8 mg/dL — ABNORMAL LOW (ref 8.9–10.3)
Chloride: 100 mmol/L (ref 98–111)
Creatinine, Ser: 1.59 mg/dL — ABNORMAL HIGH (ref 0.61–1.24)
GFR calc Af Amer: 54 mL/min — ABNORMAL LOW (ref >60)
GFR calc non Af Amer: 46 mL/min — ABNORMAL LOW (ref >60)
Glucose, Bld: 145 mg/dL — ABNORMAL HIGH (ref 70–99)
Potassium: 4.6 mmol/L (ref 3.5–5.1)
Sodium: 134 mmol/L — ABNORMAL LOW (ref 135–145)

## 2020-06-04 LAB — POCT I-STAT EG7
Acid-Base Excess: 0 mmol/L (ref 0.0–2.0)
Acid-Base Excess: 4 mmol/L — ABNORMAL HIGH (ref 0.0–2.0)
Bicarbonate: 26.8 mmol/L (ref 20.0–28.0)
Bicarbonate: 30.8 mmol/L — ABNORMAL HIGH (ref 20.0–28.0)
Calcium, Ion: 1.02 mmol/L — ABNORMAL LOW (ref 1.15–1.40)
Calcium, Ion: 1.24 mmol/L (ref 1.15–1.40)
HCT: 29 % — ABNORMAL LOW (ref 39.0–52.0)
HCT: 32 % — ABNORMAL LOW (ref 39.0–52.0)
Hemoglobin: 10.9 g/dL — ABNORMAL LOW (ref 13.0–17.0)
Hemoglobin: 9.9 g/dL — ABNORMAL LOW (ref 13.0–17.0)
O2 Saturation: 56 %
O2 Saturation: 58 %
Potassium: 3.8 mmol/L (ref 3.5–5.1)
Potassium: 4.5 mmol/L (ref 3.5–5.1)
Sodium: 139 mmol/L (ref 135–145)
Sodium: 144 mmol/L (ref 135–145)
TCO2: 28 mmol/L (ref 22–32)
TCO2: 33 mmol/L — ABNORMAL HIGH (ref 22–32)
pCO2, Ven: 51 mmHg (ref 44.0–60.0)
pCO2, Ven: 58.2 mmHg (ref 44.0–60.0)
pH, Ven: 7.328 (ref 7.250–7.430)
pH, Ven: 7.332 (ref 7.250–7.430)
pO2, Ven: 32 mmHg (ref 32.0–45.0)
pO2, Ven: 33 mmHg (ref 32.0–45.0)

## 2020-06-04 LAB — GLUCOSE, CAPILLARY
Glucose-Capillary: 195 mg/dL — ABNORMAL HIGH (ref 70–99)
Glucose-Capillary: 178 mg/dL — ABNORMAL HIGH (ref 70–99)
Glucose-Capillary: 144 mg/dL — ABNORMAL HIGH (ref 70–99)

## 2020-06-04 LAB — CBC
HCT: 32.2 % — ABNORMAL LOW (ref 39.0–52.0)
Hemoglobin: 10.6 g/dL — ABNORMAL LOW (ref 13.0–17.0)
MCH: 31 pg (ref 26.0–34.0)
MCHC: 32.9 g/dL (ref 30.0–36.0)
MCV: 94.2 fL (ref 80.0–100.0)
Platelets: 163 10*3/uL (ref 150–400)
RBC: 3.42 MIL/uL — ABNORMAL LOW (ref 4.22–5.81)
RDW: 14.1 % (ref 11.5–15.5)
WBC: 3.7 10*3/uL — ABNORMAL LOW (ref 4.0–10.5)
nRBC: 0 % (ref 0.0–0.2)

## 2020-06-04 LAB — POCT I-STAT 7, (LYTES, BLD GAS, ICA,H+H)
Acid-Base Excess: 0 mmol/L (ref 0.0–2.0)
Bicarbonate: 26 mmol/L (ref 20.0–28.0)
Calcium, Ion: 1.02 mmol/L — ABNORMAL LOW (ref 1.15–1.40)
HCT: 29 % — ABNORMAL LOW (ref 39.0–52.0)
Hemoglobin: 9.9 g/dL — ABNORMAL LOW (ref 13.0–17.0)
O2 Saturation: 97 %
Potassium: 3.9 mmol/L (ref 3.5–5.1)
Sodium: 143 mmol/L (ref 135–145)
TCO2: 27 mmol/L (ref 22–32)
pCO2 arterial: 45.2 mmHg (ref 32.0–48.0)
pH, Arterial: 7.368 (ref 7.350–7.450)
pO2, Arterial: 90 mmHg (ref 83.0–108.0)

## 2020-06-04 LAB — CREATININE, SERUM
Creatinine, Ser: 1.68 mg/dL — ABNORMAL HIGH (ref 0.61–1.24)
GFR calc Af Amer: 50 mL/min — ABNORMAL LOW (ref >60)
GFR calc non Af Amer: 43 mL/min — ABNORMAL LOW (ref >60)

## 2020-06-04 LAB — SODIUM: Sodium: 136 mmol/L (ref 135–145)

## 2020-06-04 LAB — MAGNESIUM: Magnesium: 1.8 mg/dL (ref 1.7–2.4)

## 2020-06-04 SURGERY — RIGHT/LEFT HEART CATH AND CORONARY ANGIOGRAPHY
Anesthesia: LOCAL

## 2020-06-04 MED ORDER — VERAPAMIL HCL 2.5 MG/ML IV SOLN
INTRAVENOUS | Status: AC
  Filled 2020-06-04: qty 2

## 2020-06-04 MED ORDER — MIDAZOLAM HCL 2 MG/2ML IJ SOLN
INTRAMUSCULAR | Status: AC
  Filled 2020-06-04: qty 2

## 2020-06-04 MED ORDER — IOHEXOL 350 MG/ML SOLN
INTRAVENOUS | Status: DC | PRN
  Administered 2020-06-04: 50 mL

## 2020-06-04 MED ORDER — ENOXAPARIN SODIUM 40 MG/0.4ML ~~LOC~~ SOLN
40.0000 mg | SUBCUTANEOUS | Status: DC
  Administered 2020-06-05: 40 mg via SUBCUTANEOUS
  Filled 2020-06-04: qty 0.4

## 2020-06-04 MED ORDER — FENTANYL CITRATE (PF) 100 MCG/2ML IJ SOLN
INTRAMUSCULAR | Status: DC | PRN
  Administered 2020-06-04: 25 ug via INTRAVENOUS

## 2020-06-04 MED ORDER — HYDRALAZINE HCL 20 MG/ML IJ SOLN: 10.0000 mg | INTRAMUSCULAR | Status: AC | PRN

## 2020-06-04 MED ORDER — LABETALOL HCL 5 MG/ML IV SOLN: 10.0000 mg | INTRAVENOUS | Status: AC | PRN

## 2020-06-04 MED ORDER — ACETAMINOPHEN 325 MG PO TABS: 650.0000 mg | ORAL_TABLET | ORAL | Status: DC | PRN

## 2020-06-04 MED ORDER — MIDAZOLAM HCL 2 MG/2ML IJ SOLN
INTRAMUSCULAR | Status: DC | PRN
  Administered 2020-06-04: 1 mg via INTRAVENOUS

## 2020-06-04 MED ORDER — SODIUM CHLORIDE 0.9% FLUSH: 3.0000 mL | INTRAVENOUS | Status: DC | PRN

## 2020-06-04 MED ORDER — HEPARIN (PORCINE) IN NACL 1000-0.9 UT/500ML-% IV SOLN
INTRAVENOUS | Status: DC | PRN
  Administered 2020-06-04 (×2): 500 mL

## 2020-06-04 MED ORDER — VERAPAMIL HCL 2.5 MG/ML IV SOLN
INTRAVENOUS | Status: DC | PRN
  Administered 2020-06-04: 10 mL via INTRA_ARTERIAL

## 2020-06-04 MED ORDER — FENTANYL CITRATE (PF) 100 MCG/2ML IJ SOLN
INTRAMUSCULAR | Status: AC
  Filled 2020-06-04: qty 2

## 2020-06-04 MED ORDER — TORSEMIDE 20 MG PO TABS
60.0000 mg | ORAL_TABLET | Freq: Every day | ORAL | Status: DC
  Administered 2020-06-05: 60 mg via ORAL
  Filled 2020-06-04: qty 3

## 2020-06-04 MED ORDER — ONDANSETRON HCL 4 MG/2ML IJ SOLN: 4.0000 mg | Freq: Four times a day (QID) | INTRAMUSCULAR | Status: DC | PRN

## 2020-06-04 MED ORDER — HEPARIN SODIUM (PORCINE) 1000 UNIT/ML IJ SOLN
INTRAMUSCULAR | Status: AC
  Filled 2020-06-04: qty 1

## 2020-06-04 MED ORDER — LIDOCAINE HCL (PF) 1 % IJ SOLN
INTRAMUSCULAR | Status: DC | PRN
  Administered 2020-06-04 (×2): 2 mL

## 2020-06-04 MED ORDER — SODIUM CHLORIDE 0.9 % IV SOLN: 250.0000 mL | INTRAVENOUS | Status: DC | PRN

## 2020-06-04 MED ORDER — HEPARIN SODIUM (PORCINE) 1000 UNIT/ML IJ SOLN
INTRAMUSCULAR | Status: DC | PRN
  Administered 2020-06-04: 4000 [IU] via INTRAVENOUS

## 2020-06-04 MED ORDER — LIDOCAINE HCL (PF) 1 % IJ SOLN
INTRAMUSCULAR | Status: AC
  Filled 2020-06-04: qty 30

## 2020-06-04 MED ORDER — HEPARIN (PORCINE) IN NACL 1000-0.9 UT/500ML-% IV SOLN
INTRAVENOUS | Status: AC
  Filled 2020-06-04: qty 1000

## 2020-06-04 MED ORDER — MAGNESIUM SULFATE 2 GM/50ML IV SOLN
2.0000 g | Freq: Once | INTRAVENOUS | Status: AC
  Administered 2020-06-04: 2 g via INTRAVENOUS
  Filled 2020-06-04: qty 50

## 2020-06-04 MED ORDER — SODIUM CHLORIDE 0.9% FLUSH
3.0000 mL | Freq: Two times a day (BID) | INTRAVENOUS | Status: DC
  Administered 2020-06-04 – 2020-06-05 (×2): 3 mL via INTRAVENOUS

## 2020-06-04 SURGICAL SUPPLY — 10 items
CATH 5FR JL3.5 JR4 ANG PIG MP (CATHETERS) ×1 IMPLANT
CATH BALLN WEDGE 5F 110CM (CATHETERS) ×1 IMPLANT
DEVICE RAD COMP TR BAND LRG (VASCULAR PRODUCTS) ×1 IMPLANT
GLIDESHEATH SLEND SS 6F .021 (SHEATH) ×1 IMPLANT
GUIDEWIRE .025 260CM (WIRE) ×1 IMPLANT
GUIDEWIRE INQWIRE 1.5J.035X260 (WIRE) IMPLANT
INQWIRE 1.5J .035X260CM (WIRE) ×2
PACK CARDIAC CATHETERIZATION (CUSTOM PROCEDURE TRAY) ×2 IMPLANT
SHEATH GLIDE SLENDER 4/5FR (SHEATH) ×1 IMPLANT
TRANSDUCER W/STOPCOCK (MISCELLANEOUS) ×2 IMPLANT

## 2020-06-04 NOTE — Progress Notes (Signed)
Advanced Heart Failure Rounding Note  PCP-Cardiologist: [name], MD    Patient Profile   59 y/o male with polysubstance abuse (ETOH, cocaine tobacco), cirrhosis, permanent AF, hyponatremia and systolic HF due to NICM EF 20-25% in past (most recently 35-40% in 4/21). Readmitted with CP, a/c systolic HF and recurrent hyponatremia.   Subjective:    Lasix held last night for cath today. Says he feels better but still thinks he has some fluid on board. No orthopnea or PND. Mild chest pressure. Says he thinks he is nervous about cath.   Cath this am Non-obstructive CAD Mildly elevated filling pressures with mildly reduced CO  RA 12 PA 54/20 (33) PCWP 22 LVEDP 12 Fick 4.7/2.3  Objective:   Weight Range: 80 kg Body mass index is 24.59 kg/m.   Vital Signs:   Temp:  [97.9 F (36.6 C)-98.5 F (36.9 C)] 98.5 F (36.9 C) (06/14 0502) Pulse Rate:  [65-76] 66 (06/14 0502) Resp:  [16] 16 (06/13 1723) BP: (129-144)/(73-83) 134/83 (06/14 0502) SpO2:  [98 %-100 %] 100 % (06/14 0734) Weight:  [80 kg] 80 kg (06/14 0502) Last BM Date: 06/03/20  Weight change: Filed Weights   06/02/20 0256 06/03/20 0539 06/04/20 0502  Weight: 81.5 kg 80.3 kg 80 kg    Intake/Output:   Intake/Output Summary (Last 24 hours) at 06/04/2020 0820 Last data filed at 06/03/2020 2300 Gross per 24 hour  Intake 1182 ml  Output 1200 ml  Net -18 ml      Physical Exam    General:  Well appearing. No resp difficulty HEENT: normal Neck: supple. JVP 8-9 . Carotids 2+ bilat; no bruits. No lymphadenopathy or thryomegaly appreciated. Cor: PMI nondisplaced. Irregular rate & rhythm. No rubs, gallops or murmurs. Lungs: clear Abdomen: soft, nontender, nondistended. No hepatosplenomegaly. No bruits or masses. Good bowel sounds. Extremities: no cyanosis, clubbing, rash, edema Neuro: alert & orientedx3, cranial nerves grossly intact. moves all 4 extremities w/o difficulty. Affect pleasant   Telemetry     AF  rate controlled 60s + PVCsPersonally reviewed  Labs    CBC No results for input(s): WBC, NEUTROABS, HGB, HCT, MCV, PLT in the last 72 hours. Basic Metabolic Panel Recent Labs    06/03/20 0750 06/03/20 1445 06/04/20 0006 06/04/20 0452  NA 132*   < > 134* 136  K 4.5  --  4.6  --   CL 98  --  100  --   CO2 26  --  26  --   GLUCOSE 133*  --  145*  --   BUN 35*  --  34*  --   CREATININE 1.76*  --  1.59*  --   CALCIUM 8.6*  --  8.8*  --   MG 2.0  --  1.8  --    < > = values in this interval not displayed.   Liver Function Tests No results for input(s): AST, ALT, ALKPHOS, BILITOT, PROT, ALBUMIN in the last 72 hours. No results for input(s): LIPASE, AMYLASE in the last 72 hours. Cardiac Enzymes No results for input(s): CKTOTAL, CKMB, CKMBINDEX, TROPONINI in the last 72 hours.  BNP: BNP (last 3 results) Recent Labs    05/03/20 1432 05/10/20 1225 05/29/20 0430  BNP 1,538.7* 745.9* 678.3*    ProBNP (last 3 results) No results for input(s): PROBNP in the last 8760 hours.   D-Dimer No results for input(s): DDIMER in the last 72 hours. Hemoglobin A1C No results for input(s): HGBA1C in the last 72  hours. Fasting Lipid Panel No results for input(s): CHOL, HDL, LDLCALC, TRIG, CHOLHDL, LDLDIRECT in the last 72 hours. Thyroid Function Tests No results for input(s): TSH, T4TOTAL, T3FREE, THYROIDAB in the last 72 hours.  Invalid input(s): FREET3  Other results:   Imaging    No results found.   Medications:     Scheduled Medications: . [MAR Hold] atorvastatin  20 mg Oral q1800  . [MAR Hold] DULoxetine  60 mg Oral Daily  . [MAR Hold] enoxaparin (LOVENOX) injection  40 mg Subcutaneous Q24H  . [MAR Hold] folic acid  1 mg Oral Daily  . [MAR Hold] gabapentin  600 mg Oral QHS  . [MAR Hold] insulin aspart  0-5 Units Subcutaneous QHS  . [MAR Hold] insulin aspart  0-9 Units Subcutaneous TID WC  . [MAR Hold] isosorbide-hydrALAZINE  0.5 tablet Oral TID  . [MAR Hold]  lidocaine  1 patch Transdermal Q24H  . [MAR Hold] mexiletine  200 mg Oral Q12H  . [MAR Hold] multivitamin with minerals  1 tablet Oral Daily  . [MAR Hold] sodium chloride flush  3 mL Intravenous Once  . sodium chloride flush  3 mL Intravenous Q12H  . [MAR Hold] spironolactone  25 mg Oral Daily  . [MAR Hold] thiamine  100 mg Oral Daily    Infusions: . sodium chloride    . sodium chloride 10 mL/hr at 06/04/20 0654    PRN Medications: sodium chloride, [MAR Hold] acetaminophen, [MAR Hold] albuterol, fentaNYL, Heparin (Porcine) in NaCl, heparin sodium (porcine), lidocaine (PF), [MAR Hold] LORazepam, midazolam, [MAR Hold] oxyCODONE, Radial Cocktail/Verapamil only, sodium chloride flush, [MAR Hold] traMADol    Assessment/Plan   1. A/C Systolic Heart Failure  - ECHO 2021 EF 35-40% . NICM  - Cath today with evidence of biventricular HF with mildly elevated filling pressure EF 20% - continue bidil (this is new) and spiro. - off dig with AKI - off b-blocker with bradycardia - no ACE/ARB/ARNI due to angioedema - restart home diuretics  2. Hyponatremia -Sodium 120>117>>122-> 123-> 128 -> 132 -> 134 - due to HF/beer potamania - Received dose of tolvaptan 6/10, 6/11, 6/12 - Stressed need to avoid beer and restrict free water - [employer]   3. Chest  Pain  - Doubt ACS.  - HS Trop 27>.31 - non-obstructive CAD on cath  4. A fib, permanent  - Rate controlled. Amio stopped due to not candidate for DC-CV - no anticoagulant with ETOH abuse and falls.   5. ETOH Abuse  - Discussed alcohol cessation. Says he is cutting back  6. PVCs -Has not been on amiodarone /digoxin due to bradycardia.  - PVCs ~10/hr (often more) > 10%. Was on amio prior to admit and PVCs not suppressed. Will switch to mexilitene 200 bid - continue to monitor on tele - Keep K > 4.0 Mg > 2.0  7. AKI -Creatinine trending up 1.19>>1.24>>1.58>>1.61>>1.73 > 1.58 > 1.76> 1.59  He has a very advanced CM  with severe biventricular dysfunction. Filling pressures are about as goo as I think we can get them. Suspect CM may be due to PVCs but also has significant ETOH intake.   Will titrate meds. Likely home in am.    Length of Stay: [place], MD  06/04/2020, 8:20 AM  Advanced Heart Failure Team Pager [phone] (M-F; 7a - 4p)  Please contact [facility] for night-coverage after hours (4p -7a ) and weekends on [email or web address]

## 2020-06-04 NOTE — Plan of Care (Signed)
  Problem: Clinical Measurements: Goal: Ability to maintain clinical measurements within normal limits will improve Outcome: Progressing Goal: Diagnostic test results will improve Outcome: Progressing   Problem: Pain Managment: Goal: General experience of comfort will improve Outcome: Progressing   Problem: Clinical Measurements: Goal: Respiratory complications will improve Outcome: Completed/Met Goal: Cardiovascular complication will be avoided Outcome: Completed/Met   Problem: Activity: Goal: Capacity to carry out activities will improve Outcome: Completed/Met   Problem: Cardiac: Goal: Ability to achieve and maintain adequate cardiopulmonary perfusion will improve Outcome: Completed/Met

## 2020-06-04 NOTE — Telephone Encounter (Signed)
CSW contacted by CHW RN case manager and CSW to assist with pt getting birth certificate which he needs to proceed with applying for housing.  Pt had reported he was trying to get it changed and was directed to [facility] but it was going to cost over $100 which he doesn't have.  CSW called pt to discuss and he can't provide information for who he has talked to previously regarding getting a birth certificate and costs and would like help moving forward figuring out how to get birth certificate.  CSW called [employer] of deeds as pt was born in [facility] and was told that they do not have birth certificate under pt new name but still have it under his birth name, Jerrel Ivory.  They report that we would need to go through vital records out of [place] in order to get full name changed.  CSW called vital records in  and had to leave message with customer service line- per message should hear back within 24 hours  CSW will continue to follow pt and assist as needed  [name], [facility] Desk#: [phone] Cell#: [phone]

## 2020-06-04 NOTE — Progress Notes (Signed)
Occupational Therapy Treatment Patient Details Name: Brad [name] MRN: [id number] DOB: 1961-02-09 Today's Date: 06/04/2020    History of present illness  59 y.o. male with a pertinent prior medical history of of ETOH abuse, Afib (not anticoagulated due to falls on EtOH), CKD IIIa, systolic CHF, HTN with multiple admissions this year for chronic hyponatremia, who presenting with shortness of breath and chest pains that woke him up at night on 6/8.   OT comments  Pt progressing with ADL and mobility tasks. Pt supervisionA to minguardA overall with RW. Pt following all commands; pt comprehending fluid restrictions. Pt did not require cues for safety today. Pt standing at sink for light grooming and for toilet/commode transfer with supervisionA. VSS pre exertion BP 144/90; post exertion: 138/91; O2>90% on RA. Pt continues to progress and would benefit from continued OT skilled services. OT following acutely.    Follow Up Recommendations  No OT follow up    Equipment Recommendations  Tub/shower seat    Recommendations for Other Services      Precautions / Restrictions Precautions Precautions: Fall Restrictions Weight Bearing Restrictions: No       Mobility Bed Mobility Overal bed mobility: Modified Independent             General bed mobility comments: no physical assist required.  Transfers Overall transfer level: Needs assistance Equipment used: Straight cane Transfers: Sit to/from Stand Sit to Stand: Min guard         General transfer comment: min guard for safety     Balance Overall balance assessment: Needs assistance Sitting-balance support: No upper extremity supported;Feet supported Sitting balance-Leahy Scale: Good     Standing balance support: Single extremity supported Standing balance-Leahy Scale: Poor                             ADL either performed or assessed with clinical judgement   ADL Overall ADL's : Needs  assistance/impaired Eating/Feeding: Modified independent;Sitting   Grooming: Supervision/safety;Sitting   Upper Body Bathing: Supervision/ safety;Standing   Lower Body Bathing: Supervison/ safety;Sitting/lateral leans;Sit to/from stand   Upper Body Dressing : Supervision/safety;Sitting;Standing;Cueing for safety   Lower Body Dressing: Supervision/safety;Sitting/lateral leans;Sit to/from stand   Toilet Transfer: Supervision/safety;Ambulation;RW   Toileting- Clothing Manipulation and Hygiene: Set up;Sitting/lateral lean;Sit to/from stand;Cueing for safety       Functional mobility during ADLs: Min guard;Rolling walker General ADL Comments: Pt limited by requiring redirection to avoid tangential or unnecessary conversations. Pt standing at sink for light grooming and for toilet/commode transfer.     Vision   Vision Assessment?: No apparent visual deficits   Perception     Praxis      Cognition Arousal/Alertness: Awake/alert Behavior During Therapy: WFL for tasks assessed/performed Overall Cognitive Status: No family/caregiver present to determine baseline cognitive functioning                                 General Comments: Pt following all commands; pt comprehending fluid restrictions. Pt did not require cues for safety today. Pt perseverative and speaking out about RN beign pregnant and requiring redirection.        Exercises     Shoulder Instructions       General Comments Pt VSS pre exertion BP 144/90; post exertion: 138/91; O2>90% on RA.    Pertinent Vitals/ Pain       Pain Assessment: Faces Faces Pain Scale: Hurts  a little bit Pain Location: chest Pain Descriptors / Indicators: Sore Pain Intervention(s): Monitored during session  Home Living                                          Prior Functioning/Environment              Frequency  Min 2X/week        Progress Toward Goals  OT Goals(current goals can now  be found in the care plan section)  Progress towards OT goals: Progressing toward goals  Acute Rehab OT Goals Patient Stated Goal: to go home, OT Goal Formulation: With patient Time For Goal Achievement: 06/12/20 Potential to Achieve Goals: Good ADL Goals Pt Will Perform Grooming: with modified independence;standing Pt Will Perform Upper Body Dressing: Independently;sitting Pt Will Perform Lower Body Dressing: with modified independence;sit to/from stand;sitting/lateral leans Pt Will Transfer to Toilet: with modified independence;ambulating  Plan Discharge plan remains appropriate    Co-evaluation                 AM-PAC OT "6 Clicks" Daily Activity     Outcome Measure   Help from another person eating meals?: None Help from another person taking care of personal grooming?: A Little Help from another person toileting, which includes using toliet, bedpan, or urinal?: A Little Help from another person bathing (including washing, rinsing, drying)?: A Little Help from another person to put on and taking off regular upper body clothing?: None Help from another person to put on and taking off regular lower body clothing?: A Little 6 Click Score: 20    End of Session Equipment Utilized During Treatment: Gait belt;Rolling walker  OT Visit Diagnosis: Unsteadiness on feet (R26.81);Muscle weakness (generalized) (M62.81) Pain - part of body:  (chest)   Activity Tolerance Patient tolerated treatment well   Patient Left in bed;with call bell/phone within reach;with bed alarm set   Nurse Communication Mobility status        Time: 1010-1044 OT Time Calculation (min): 34 min  Charges: OT General Charges $OT Visit: 1 Visit OT Treatments $Self Care/Home Management : 8-22 mins $Therapeutic Activity: 8-22 mins  Brad [name], OTR/L Acute Rehabilitation Services Pager: [phone] Office: [phone]    Brad [name] C 06/04/2020, 3:35 PM

## 2020-06-04 NOTE — Progress Notes (Signed)
Physical Therapy Treatment Patient Details Name: Brad [name] MRN: [id number] DOB: 05/25/1961 Today's Date: 06/04/2020    History of Present Illness  59 y.o. male with a pertinent prior medical history of of ETOH abuse, Afib (not anticoagulated due to falls on EtOH), CKD IIIa, systolic CHF, HTN with multiple admissions this year for chronic hyponatremia, who presenting with shortness of breath and chest pains that woke him up at night on 6/8.    PT Comments    Pt is agreeable to ambulation with therapy today. Pt states he already walked with OT but "I could go for another walk." Pt continues to be limited in safe mobility by initial weakness in LE with getting out of bed however improves with continued gait. Pt is mod I for bed mobility, min guard for transfers and min A progressing to min guard for safety with ambulation of 450 feet with straight cane. D/c plans remain appropriate. PT will continue to follow acutely.     Follow Up Recommendations  No PT follow up     Equipment Recommendations  None recommended by PT       Precautions / Restrictions Precautions Precautions: Fall Restrictions Weight Bearing Restrictions: No    Mobility  Bed Mobility Overal bed mobility: Modified Independent             General bed mobility comments: no physical assist required.  Transfers Overall transfer level: Needs assistance Equipment used: Straight cane Transfers: Sit to/from Stand Sit to Stand: Min guard         General transfer comment: min guard for safety   Ambulation/Gait Ambulation/Gait assistance: Min guard;Min assist Gait Distance (Feet): 450 Feet Assistive device: Straight cane Gait Pattern/deviations: Step-through pattern;Decreased stride length;Trunk flexed Gait velocity: slowed Gait velocity interpretation: 1.31 - 2.62 ft/sec, indicative of limited community ambulator General Gait Details: min A  for steadying with tremoring in bilateral LE, with distance  able to remove hands          Balance Overall balance assessment: Needs assistance Sitting-balance support: No upper extremity supported;Feet supported Sitting balance-Leahy Scale: Good     Standing balance support: Single extremity supported Standing balance-Leahy Scale: Poor                              Cognition Arousal/Alertness: Awake/alert Behavior During Therapy: WFL for tasks assessed/performed Overall Cognitive Status: No family/caregiver present to determine baseline cognitive functioning                                 General Comments: Pt indicates he finally understands his fluid restrictions asking about what strict I/O means, Pt with good command follow today          General Comments General comments (skin integrity, edema, etc.): VSS on RA      Pertinent Vitals/Pain Pain Assessment: No/denies pain Faces Pain Scale: Hurts a little bit Pain Location: chest Pain Descriptors / Indicators: Sore Pain Intervention(s): Monitored during session           PT Goals (current goals can now be found in the care plan section) Acute Rehab PT Goals Patient Stated Goal: to go home, PT Goal Formulation: With patient Time For Goal Achievement: 06/15/20 Potential to Achieve Goals: Good Progress towards PT goals: Progressing toward goals    Frequency    Min 3X/week      PT Plan Current plan remains  appropriate       AM-PAC PT "6 Clicks" Mobility   Outcome Measure  Help needed turning from your back to your side while in a flat bed without using bedrails?: None Help needed moving from lying on your back to sitting on the side of a flat bed without using bedrails?: None Help needed moving to and from a bed to a chair (including a wheelchair)?: None Help needed standing up from a chair using your arms (e.g., wheelchair or bedside chair)?: None Help needed to walk in hospital room?: A Little Help needed climbing 3-5 steps with a  railing? : A Little 6 Click Score: 22    End of Session Equipment Utilized During Treatment: Gait belt Activity Tolerance: Patient tolerated treatment well Patient left: in bed;with call bell/phone within reach;with bed alarm set Nurse Communication: Mobility status PT Visit Diagnosis: Unsteadiness on feet (R26.81);Difficulty in walking, not elsewhere classified (R26.2)     Time: 6924-9324 PT Time Calculation (min) (ACUTE ONLY): 19 min  Charges:  $Gait Training: 8-22 mins                     Harley Mccartney B. Migdalia Dk PT, DPT Acute Rehabilitation Services Pager [phone] Office [phone]    [place] 06/04/2020, 5:24 PM

## 2020-06-04 NOTE — Interval H&P Note (Signed)
History and Physical Interval Note:  06/04/2020 7:49 AM  Brad [name]  has presented today for surgery, with the diagnosis of HF.  The various methods of treatment have been discussed with the patient and family. After consideration of risks, benefits and other options for treatment, the patient has consented to  Procedure(s): RIGHT/LEFT HEART CATH AND CORONARY ANGIOGRAPHY (N/A) and possible coronary angioplasty as a surgical intervention.  The patient's history has been reviewed, patient examined, no change in status, stable for surgery.  I have reviewed the patient's chart and labs.  Questions were answered to the patient's satisfaction.     Brad [name]

## 2020-06-04 NOTE — Progress Notes (Signed)
PROGRESS NOTE  Brad [name] MHD:[id number] DOB: 1961-05-27 DOA: 05/29/2020 PCP: Charlott Rakes, MD   LOS: 6 days   Brief Narrative / Interim history: 59 year old male with history of A. fib, chronic kidney disease stage IIIa with baseline creatinine around 1.3-1.4, prior drug use but nothing currently, alcohol abuse came into the hospital with shortness of breath, cough, lower extremity edema and orthopnea for 1 to 2 days.  He was found to be fluid overloaded, hyponatremic with sodium of 117 and was admitted to the hospital.  Chest x-ray on admission showed cardiomegaly, bilateral hilar vascular congestion.  He was started on IV furosemide without significant improvement, had bradycardia when amiodarone and digoxin were started and these were successfully stopped.  He was eventually transferred to [facility] for heart failure team evaluation.  Subjective / 24h Interval events: No chest pain, breathing better. Cath today   Assessment & Plan: Principal Problem Acute on chronic systolic CHF, nonischemic cardiomyopathy -Most recent EF 35-40% in April 2021 with global hypokinesis of the left ventricle. Feels with slight fluid overload. RHC with mildly elevated filling pressures with mildly reduced CO. Resume home diuretics, anticipate home tomorrow per cards.   Active Problems Hypervolemic hyponatremia in the setting of underlying CHF and beer use -Sodium gradually improving, 136 this morning.  Patient advised for dietary compliance as well as avoid beer altogether  Acute kidney injury on chronic kidney disease stage IIIa -Baseline creatinine 1.3-1.4, now 1.5   Persistent atrial fibrillation with slow ventricular response -Holding of digoxin, amiodarone.  Off anticoagulation due to fall risk/alcohol abuse  Hyperlipidemia -continue atorvastatin  Liver cirrhosis -Due to chronic hep C and alcoholic liver disease, this is contributing to hyponatremia as well  Alcohol use -No  clinical signs of withdrawals  DM2 -Continue sliding scale  CBG (last 3)  Recent Labs    06/03/20 1126 06/03/20 1719 06/03/20 2138  GLUCAP 135* 121* 193*    Scheduled Meds: . atorvastatin  20 mg Oral q1800  . DULoxetine  60 mg Oral Daily  . [START ON 06/05/2020] enoxaparin (LOVENOX) injection  40 mg Subcutaneous Q24H  . folic acid  1 mg Oral Daily  . gabapentin  600 mg Oral QHS  . insulin aspart  0-5 Units Subcutaneous QHS  . insulin aspart  0-9 Units Subcutaneous TID WC  . isosorbide-hydrALAZINE  0.5 tablet Oral TID  . lidocaine  1 patch Transdermal Q24H  . mexiletine  200 mg Oral Q12H  . multivitamin with minerals  1 tablet Oral Daily  . sodium chloride flush  3 mL Intravenous Once  . sodium chloride flush  3 mL Intravenous Q12H  . spironolactone  25 mg Oral Daily  . thiamine  100 mg Oral Daily  . [START ON 06/05/2020] torsemide  60 mg Oral Daily   Continuous Infusions: . sodium chloride     PRN Meds:.sodium chloride, acetaminophen, albuterol, hydrALAZINE, labetalol, LORazepam, ondansetron (ZOFRAN) IV, oxyCODONE, sodium chloride flush, traMADol  DVT prophylaxis: Lovenox Code Status: Full code Family Communication: no family at bedside   Status is: Inpatient  Remains inpatient appropriate because:Inpatient level of care appropriate due to severity of illness   Dispo: The patient is from: Home              Anticipated d/c is to: Home              Anticipated d/c date is: 1 day              Patient currently is not  medically stable to d/c.  Consultants:  Cardiology   Procedures:  [place]  Microbiology  None   Antimicrobials: None     Objective: Vitals:   06/04/20 0914 06/04/20 0929 06/04/20 0945 06/04/20 0959  BP: (!) 144/85 (!) 140/92 (!) 140/96 (!) 146/95  Pulse: 64     Resp:      Temp:      TempSrc:      SpO2: 98%     Weight:      Height:        Intake/Output Summary (Last 24 hours) at 06/04/2020 1008 Last data filed at 06/04/2020 0900 Gross  per 24 hour  Intake 1302 ml  Output 1600 ml  Net -298 ml   Filed Weights   06/02/20 0256 06/03/20 0539 06/04/20 0502  Weight: 81.5 kg 80.3 kg 80 kg    Examination:  Constitutional: NAD Eyes: no icterus  ENMT: mmm Neck: normal, supple Respiratory: cta biL, slightly dimished at the bases, no wheezing  Cardiovascular: irregular, no murmurs, trace edema Abdomen: soft, nt, nd, bs+ Musculoskeletal: no clubbing / cyanosis.  Skin: no rashes Neurologic: non focal    Data Reviewed: I have independently reviewed following labs and imaging studies   CBC: Recent Labs  Lab 05/29/20 0432 05/31/20 0424  WBC 3.7* 2.6*  NEUTROABS  --  1.8  HGB 11.5* 10.5*  HCT 33.2* 30.7*  MCV 87.8 89.5  PLT 192 595*   Basic Metabolic Panel: Recent Labs  Lab 05/29/20 0645 05/29/20 0754 05/31/20 0424 05/31/20 0918 05/31/20 1334 05/31/20 1334 06/01/20 0609 06/01/20 0749 06/02/20 0006 06/02/20 0820 06/02/20 2258 06/03/20 0750 06/03/20 1445 06/04/20 0006 06/04/20 0452  NA  --    < > 117*   < > 120*   < > 122*   < > 123*   < > 128* 132* 133* 134* 136  K  --    < > 4.0  --  3.8  --  4.1  --  4.3  --   --  4.5  --  4.6  --   CL  --    < > 85*  --  86*  --  89*  --  91*  --   --  98  --  100  --   CO2  --    < > 19*  --  22  --  24  --  23  --   --  26  --  26  --   GLUCOSE  --    < > 151*  --  148*  --  134*  --  138*  --   --  133*  --  145*  --   BUN  --    < > 31*  --  32*  --  32*  --  33*  --   --  35*  --  34*  --   CREATININE  --    < > 1.58*  --  1.61*  --  1.73*  --  1.58*  --   --  1.76*  --  1.59*  --   CALCIUM  --    < > 8.0*  --  7.8*  --  8.5*  --  8.1*  --   --  8.6*  --  8.8*  --   MG  --    < > 2.0  --   --   --  1.8  --  1.9  --   --  2.0  --  1.8  --   PHOS 3.8  --   --   --   --   --   --   --   --   --   --   --   --   --   --    < > = values in this interval not displayed.   Liver Function Tests: No results for input(s): AST, ALT, ALKPHOS, BILITOT, PROT, ALBUMIN in  the last 168 hours. Coagulation Profile: No results for input(s): INR, PROTIME in the last 168 hours. HbA1C: No results for input(s): HGBA1C in the last 72 hours. CBG: Recent Labs  Lab 06/02/20 2210 06/03/20 0739 06/03/20 1126 06/03/20 1719 06/03/20 2138  GLUCAP 146* 131* 135* 121* 193*    Recent Results (from the past 240 hour(s))  SARS Coronavirus 2 by RT PCR (hospital order, performed in [facility] hospital lab) Nasopharyngeal Nasopharyngeal Swab     Status: None   Collection Time: 05/29/20  5:29 AM   Specimen: Nasopharyngeal Swab  Result Value Ref Range Status   SARS Coronavirus 2 NEGATIVE NEGATIVE Final    Comment: (NOTE) SARS-CoV-2 target nucleic acids are NOT DETECTED. The SARS-CoV-2 RNA is generally detectable in upper and lower respiratory specimens during the acute phase of infection. The lowest concentration of SARS-CoV-2 viral copies this assay can detect is 250 copies / mL. A negative result does not preclude SARS-CoV-2 infection and should not be used as the sole basis for treatment or other patient management decisions.  A negative result may occur with improper specimen collection / handling, submission of specimen other than nasopharyngeal swab, presence of viral mutation(s) within the areas targeted by this assay, and inadequate number of viral copies (<250 copies / mL). A negative result must be combined with clinical observations, patient history, and epidemiological information. Fact Sheet for Patients:   StrictlyIdeas.no Fact Sheet for Healthcare Providers: BankingDealers.co.za This test is not yet approved or cleared  by the [place] FDA and has been authorized for detection and/or diagnosis of SARS-CoV-2 by FDA under an Emergency Use Authorization (EUA).  This EUA will remain in effect (meaning this test can be used) for the duration of the COVID-19 declaration under Section 564(b)(1) of the Act, 21  U.S.C. section 360bbb-3(b)(1), unless the authorization is terminated or revoked sooner. Performed at [facility], [place] [address], [place], [place] [address]      Radiology Studies: CARDIAC CATHETERIZATION  Result Date: 06/04/2020  Ost Ramus to Ramus lesion is 25% stenosed.  Prox Cx to Mid Cx lesion is 50% stenosed.  Ost LM lesion is 30% stenosed.  Findings: Ao = 119/68 (88) LV = 116/12 RA = 13 RV = 53/12 PA = 54/20 (33) PCW = 22 (v = 35) Fick cardiac output/index = 4.7/2.3 PVR = 2.2 WU Ao sat = 97% PA sat = 57%, 57% Assessment: 1. Mild non-obstructive CAD with unusual origin and course of RCA 2. NICM EF 20% 3. Mild to moderately elevated biventricular filling pressures 4. Mildly reduced CO Plan/Discussion: Continue medical therapy including PVC suppression and ETOH cessation. Glori Bickers, MD 9:28 AM    Marzetta Board, MD, PhD [facility]  Between 7 am - 7 pm I am available, please contact me via Amion or Securechat  Between 7 pm - 7 am I am not available, please contact night coverage MD/APP via Amion

## 2020-06-05 ENCOUNTER — Telehealth (HOSPITAL_COMMUNITY): Payer: Self-pay | Admitting: Licensed Clinical Social Worker

## 2020-06-05 ENCOUNTER — Telehealth (HOSPITAL_COMMUNITY): Payer: Self-pay

## 2020-06-05 LAB — CBC
HCT: 31.6 % — ABNORMAL LOW (ref 39.0–52.0)
Hemoglobin: 10.3 g/dL — ABNORMAL LOW (ref 13.0–17.0)
MCH: 30.5 pg (ref 26.0–34.0)
MCHC: 32.6 g/dL (ref 30.0–36.0)
MCV: 93.5 fL (ref 80.0–100.0)
Platelets: 159 10*3/uL (ref 150–400)
RBC: 3.38 MIL/uL — ABNORMAL LOW (ref 4.22–5.81)
RDW: 14.1 % (ref 11.5–15.5)
WBC: 4 10*3/uL (ref 4.0–10.5)
nRBC: 0 % (ref 0.0–0.2)

## 2020-06-05 LAB — BASIC METABOLIC PANEL
Anion gap: 9 (ref 5–15)
BUN: 28 mg/dL — ABNORMAL HIGH (ref 6–20)
CO2: 26 mmol/L (ref 22–32)
Calcium: 8.9 mg/dL (ref 8.9–10.3)
Chloride: 98 mmol/L (ref 98–111)
Creatinine, Ser: 1.56 mg/dL — ABNORMAL HIGH (ref 0.61–1.24)
GFR calc Af Amer: 55 mL/min — ABNORMAL LOW (ref >60)
GFR calc non Af Amer: 48 mL/min — ABNORMAL LOW (ref >60)
Glucose, Bld: 121 mg/dL — ABNORMAL HIGH (ref 70–99)
Potassium: 4.7 mmol/L (ref 3.5–5.1)
Sodium: 133 mmol/L — ABNORMAL LOW (ref 135–145)

## 2020-06-05 LAB — GLUCOSE, CAPILLARY
Glucose-Capillary: 114 mg/dL — ABNORMAL HIGH (ref 70–99)
Glucose-Capillary: 150 mg/dL — ABNORMAL HIGH (ref 70–99)

## 2020-06-05 LAB — MAGNESIUM: Magnesium: 1.9 mg/dL (ref 1.7–2.4)

## 2020-06-05 MED ORDER — MEXILETINE HCL 200 MG PO CAPS: 200.0000 mg | ORAL_CAPSULE | Freq: Two times a day (BID) | ORAL | 1 refills | Status: DC

## 2020-06-05 MED ORDER — SPIRONOLACTONE 25 MG PO TABS: 25.0000 mg | ORAL_TABLET | Freq: Every day | ORAL | 1 refills | Status: DC

## 2020-06-05 MED ORDER — TORSEMIDE 20 MG PO TABS: 60.0000 mg | ORAL_TABLET | Freq: Every day | ORAL | 1 refills | Status: DC

## 2020-06-05 MED ORDER — MAGNESIUM SULFATE 2 GM/50ML IV SOLN
2.0000 g | Freq: Once | INTRAVENOUS | Status: AC
  Administered 2020-06-05: 2 g via INTRAVENOUS
  Filled 2020-06-05: qty 50

## 2020-06-05 MED ORDER — ISOSORB DINITRATE-HYDRALAZINE 20-37.5 MG PO TABS: 0.5000 | ORAL_TABLET | Freq: Three times a day (TID) | ORAL | 1 refills | Status: DC

## 2020-06-05 MED ORDER — ISOSORB DINITRATE-HYDRALAZINE 20-37.5 MG PO TABS: 0.5000 | ORAL_TABLET | Freq: Three times a day (TID) | ORAL | 1 refills | Status: AC

## 2020-06-05 MED FILL — MEXILETINE 200 MG CAPSULE: 200 | 30 days supply | Qty: 60 | Fill #0

## 2020-06-05 MED FILL — BIDIL 20-37.5 MG TABS: 20-37.5 | 30 days supply | Qty: 45 | Fill #0

## 2020-06-05 MED FILL — TORSEMIDE 20 MG TABLET: 20 | 30 days supply | Qty: 90 | Fill #0

## 2020-06-05 NOTE — Telephone Encounter (Signed)
CSW informed by physician that pt is agreeable to starting back with the [employer] and would like the additional support and assistance with medications again.  CSW called pt to discuss reenrollment.  Pt felt frustrated by short notice for some appts with paramedic and would like to have more notice.  CSW encouraged pt to discuss these concerns with paramedic and be conscious of communicating scheduling barriers on his end as well- pt expressed understanding and is agreeable to reenrollment.  CSW completed referral and sent back out to [place] who had pt previously.  CSW also continuing to attempt to figure out getting pt a birth certificate with his new name on it and left another message with the vital records department.  CSW will continue to follow and assist as needed  [name], [facility] Desk#: [phone] Cell#: [phone]

## 2020-06-05 NOTE — Telephone Encounter (Signed)
Left voice message for Brad [name] to contact me in regards to scheduling home visit. Will continue to follow.

## 2020-06-05 NOTE — Progress Notes (Addendum)
Advanced Heart Failure Rounding Note  PCP-Cardiologist: [name], MD    Patient Profile   59 y/o male with polysubstance abuse (ETOH, cocaine tobacco), cirrhosis, permanent AF, hyponatremia and systolic HF due to NICM EF 20-25% in past (most recently 35-40% in 4/21). Readmitted with CP, a/c systolic HF and recurrent hyponatremia.   Subjective:    R/LHC 06/04/20 Non-obstructive CAD Mildly elevated filling pressures with mildly reduced CO  RA 12 PA 54/20 (33) PCWP 22 LVEDP 12 Fick 4.7/2.3  Back on PO diuretics. Wt down another 2 lb. SCr trending down, 1.68>>1.56.   PVC rate overnight 3-7/hr.   Feels well. Sitting up in bed eating breakfast. No cardiac complaints.    Objective:   Weight Range: 78.9 kg Body mass index is 24.27 kg/m.   Vital Signs:   Temp:  [97.5 F (36.4 C)-97.8 F (36.6 C)] 97.8 F (36.6 C) (06/15 0830) Pulse Rate:  [64-77] 68 (06/15 0408) Resp:  [13-18] 18 (06/15 0830) BP: (122-149)/(74-102) 133/98 (06/15 0830) SpO2:  [97 %-100 %] 99 % (06/15 0408) Weight:  [78.9 kg] 78.9 kg (06/15 0408) Last BM Date: 06/04/20  Weight change: Filed Weights   06/03/20 0539 06/04/20 0502 06/05/20 0408  Weight: 80.3 kg 80 kg 78.9 kg    Intake/Output:   Intake/Output Summary (Last 24 hours) at 06/05/2020 0900 Last data filed at 06/05/2020 0800 Gross per 24 hour  Intake 483.06 ml  Output 400 ml  Net 83.06 ml      Physical Exam    General:  Well appearing. No resp difficulty HEENT: dentition in poor repair, otherwise normal Neck: supple. No JVD . Carotids 2+ bilat; no bruits. No lymphadenopathy or thryomegaly appreciated. Cor: PMI nondisplaced. Irregular rate & rhythm. No rubs, gallops or murmurs. Lungs: clear Abdomen: soft, nontender, nondistended. No hepatosplenomegaly. No bruits or masses. Good bowel sounds. Extremities: no cyanosis, clubbing, rash, edema Neuro: alert & orientedx3, cranial nerves grossly intact. moves all 4 extremities w/o  difficulty. Affect pleasant   Telemetry   Chronic AF  rate controlled 60s + 3-7 PVCs/hr Personally reviewed  Labs    CBC Recent Labs    06/04/20 0902 06/05/20 0326  WBC 3.7* 4.0  HGB 10.6* 10.3*  HCT 32.2* 31.6*  MCV 94.2 93.5  PLT 163 086   Basic Metabolic Panel Recent Labs    06/04/20 0006 06/04/20 0006 06/04/20 0452 06/04/20 0808 06/04/20 0902 06/05/20 0326  NA 134*  --    < > 144  --  133*  K 4.6  --    < > 3.8  --  4.7  CL 100  --   --   --   --  98  CO2 26  --   --   --   --  26  GLUCOSE 145*  --   --   --   --  121*  BUN 34*  --   --   --   --  28*  CREATININE 1.59*   < >  --   --  1.68* 1.56*  CALCIUM 8.8*  --   --   --   --  8.9  MG 1.8  --   --   --   --  1.9   < > = values in this interval not displayed.   Liver Function Tests No results for input(s): AST, ALT, ALKPHOS, BILITOT, PROT, ALBUMIN in the last 72 hours. No results for input(s): LIPASE, AMYLASE in the last 72 hours. Cardiac Enzymes  No results for input(s): CKTOTAL, CKMB, CKMBINDEX, TROPONINI in the last 72 hours.  BNP: BNP (last 3 results) Recent Labs    05/03/20 1432 05/10/20 1225 05/29/20 0430  BNP 1,538.7* 745.9* 678.3*    ProBNP (last 3 results) No results for input(s): PROBNP in the last 8760 hours.   D-Dimer No results for input(s): DDIMER in the last 72 hours. Hemoglobin A1C No results for input(s): HGBA1C in the last 72 hours. Fasting Lipid Panel No results for input(s): CHOL, HDL, LDLCALC, TRIG, CHOLHDL, LDLDIRECT in the last 72 hours. Thyroid Function Tests No results for input(s): TSH, T4TOTAL, T3FREE, THYROIDAB in the last 72 hours.  Invalid input(s): FREET3  Other results:   Imaging    No results found.   Medications:     Scheduled Medications: . atorvastatin  20 mg Oral q1800  . DULoxetine  60 mg Oral Daily  . enoxaparin (LOVENOX) injection  40 mg Subcutaneous Q24H  . folic acid  1 mg Oral Daily  . gabapentin  600 mg Oral QHS  . insulin aspart   0-5 Units Subcutaneous QHS  . insulin aspart  0-9 Units Subcutaneous TID WC  . isosorbide-hydrALAZINE  0.5 tablet Oral TID  . lidocaine  1 patch Transdermal Q24H  . mexiletine  200 mg Oral Q12H  . multivitamin with minerals  1 tablet Oral Daily  . sodium chloride flush  3 mL Intravenous Once  . sodium chloride flush  3 mL Intravenous Q12H  . spironolactone  25 mg Oral Daily  . thiamine  100 mg Oral Daily  . torsemide  60 mg Oral Daily    Infusions: . sodium chloride      PRN Medications: sodium chloride, acetaminophen, albuterol, LORazepam, ondansetron (ZOFRAN) IV, oxyCODONE, sodium chloride flush, traMADol    Assessment/Plan   1. A/C Systolic Heart Failure  - ECHO 2021 EF 35-40% . NICM  - Cath yesterday with evidence of biventricular HF with mildly elevated filling pressure EF 20% - Suspect CM may be due to PVCs but also has significant ETOH intake.  - continue bidil (this is new) and spiro. - off dig with AKI - off b-blocker with bradycardia - no ACE/ARB/ARNI due to angioedema - volume status good. Continue torsemide 60 mg daily   2. Hyponatremia - Sodium 120>117>>122-> 123-> 128 -> 132 -> 134->144->133  - due to HF/beer potamania - Received dose of tolvaptan 6/10, 6/11, 6/12 - Stressed need to avoid beer and restrict free water - [employer]  - discussed importance of reducing ETOH intake  3. Chest  Pain  - Doubt ACS.  - HS Trop 27>.31 - non-obstructive CAD on cath  4. A fib, permanent  - Rate controlled. Amio stopped due to not candidate for DC-CV - no anticoagulant with ETOH abuse and falls.   5. ETOH Abuse  - Discussed alcohol cessation. Says he is cutting back  6. PVCs -Has not been on amiodarone /digoxin due to bradycardia.  - PVCs ~10/hr (often more) > 10%. Was on amio prior to admit and PVCs not suppressed. Switched to mexilitene 200 bid - 3-7 PVCs/hr overnight on tele review  - Keep K > 4.0 Mg > 2.0  7. AKI -Creatinine trending back  down 1.19>>1.24>>1.58>>1.61>>1.73 > 1.58 > 1.76> 1.59>1.68>1.56  Stable for d/c home from cardiac standpoint. [facility] post hospital f/u info placed in AVS. He will continue to be followed by Paramedicine in the community.   Cardiac Meds for Discharge  Atorvastatin 20 mg qhs Bidil 1/2 tablet  TID Mexiletine 200 mg bid  Spironolactone 25 mg daily  Torsemide 60 mg daily      Length of Stay: [address] PA-C  06/05/2020, 9:00 AM  Advanced Heart Failure Team Pager [phone] (M-F; [place])  Please contact [facility] for night-coverage after hours (4p -7a ) and weekends on [email or web address]   Patient seen and examined with the above-signed Advanced Practice Provider and/or Housestaff. I personally reviewed laboratory data, imaging studies and relevant notes. I independently examined the patient and formulated the important aspects of the plan. I have edited the note to reflect any of my changes or salient points. I have personally discussed the plan with the patient and/or family.  Cath results reviewed with him. HF and serum sodium much improved. PVC burden down. Renal function stable.   General:  Well appearing. No resp difficulty HEENT: normal Neck: supple. JVP 8-9. Carotids 2+ bilat; no bruits. No lymphadenopathy or thryomegaly appreciated. Cor: PMI nondisplaced. Regular rate & rhythm. No rubs, gallops or murmurs. Lungs: clear Abdomen: soft, nontender, nondistended. No hepatosplenomegaly. No bruits or masses. Good bowel sounds. Extremities: no cyanosis, clubbing, rash, edema Neuro: alert & orientedx3, cranial nerves grossly intact. moves all 4 extremities w/o difficulty. Affect pleasant  [place] for d/c home on above meds + ECASA 81. Need for ETOH cessation stressed. Will arrange f/u in HF Clinic.   Glori Bickers, MD  2:52 PM

## 2020-06-05 NOTE — Discharge Summary (Signed)
Physician Discharge Summary  Brad [name] OEU:[id number] DOB: 08-03-1961 DOA: 05/29/2020  PCP: Charlott Rakes, MD  Admit date: 05/29/2020 Discharge date: 06/05/2020  Admitted From: home Disposition:  home  Recommendations for Outpatient Follow-up:  1. Follow up with Heart failure clinic as scheduled in 9 days   Home Health: none Equipment/Devices: none  Discharge Condition: stable CODE STATUS: Full code Diet recommendation: low sodium  HPI: Per admitting MD, Brad [name] is a 59 y.o. male with PMH of ETOH abuse, Afib not on meds, CKD, CHF, HTN with multiple admissions this yearfor chronic hyponatremia presenting with CHF exacerbation and hyponatremia. Patient reports chest pain and SOB since last night. Chest pain has been constant and located over sternum only without radiation, worse with inspiration. Pain woke him from sleep. He has not taken anything for the pain. Reports a non-productive cough for last 24 hours. Reports compliance with low sodium diet and fluid restriction and with all meds. Has noticed worsening swelling of his legs over last few days; has not weighed himself. Denies headache, dizziness, fever, chills, abdominal pain, nausea, vomiting, diarrhea, constipation, dysuria, hematuria, hematochezia, melena, difficulty moving arms/legs, speech difficulty, trouble eating, confusion or any other complaints.  Hospital Course / Discharge diagnoses: Principal Problem Acute on chronic systolic CHF, nonischemic cardiomyopathy -patient admitted to the hospital with fluid overload. Most recent EF 35-40% in April 2021 with global hypokinesis of the left ventricle. Heart failure team consulted and followed patient while hospitalized. RHC with mildly elevated filling pressures with mildly reduced CO. He was diuresed with IV Lasix with improvement in his overload, he is now euvolemic, will change his home diuretic regimen by increasing his torsemide to 60 mg daily in addision to  spironolactone and will be discharged home in stable condition. Suspect cardiomyopathy due to PVCs but also has ETOH intake. His amiodarone and digoxin were discontinued by cardiology and he was started on Mexiletine and Bidil.   Active Problems Hypervolemic hyponatremia in the setting of underlying CHF and beer use -Sodium gradually improving, 133 on discharge, s/p Tolvaptan x 3. Counseled for ETOH cessation Acute kidney injury on chronic kidney disease stage IIIa -Baseline creatinine 1.3-1.4, 1.5 on discharge. Off digoxin now.  Persistent atrial fibrillation with slow ventricular response -stop digoxin, amiodarone.  Off anticoagulation due to fall risk/alcohol abuse Hyperlipidemia -continue atorvastatin Liver cirrhosis -Due to chronic hep C and alcoholic liver disease, this is contributing to hyponatremia as well Alcohol use -No clinical signs of withdrawals DM2 -A1C 6.9  Discharge Instructions   Allergies as of 06/05/2020      Reactions   Angiotensin Receptor Blockers Anaphylaxis, Other (See Comments)   (Angioedema also with Lisinopril, therefore ARB's are contraindicated)   Lisinopril Anaphylaxis, Swelling   Throat swelling   Pamelor [nortriptyline Hcl] Anaphylaxis, Swelling   Throat swells      Medication List    STOP taking these medications   amiodarone 200 MG tablet Commonly known as: PACERONE   digoxin 0.125 MG tablet Commonly known as: LANOXIN   tiZANidine 2 MG tablet Commonly known as: ZANAFLEX   traMADol 50 MG tablet Commonly known as: ULTRAM     TAKE these medications   albuterol 108 (90 Base) MCG/ACT inhaler Commonly known as: VENTOLIN HFA Inhale 2 puffs into the lungs every 6 (six) hours as needed for shortness of breath.   atorvastatin 20 MG tablet Commonly known as: LIPITOR Take 1 tablet (20 mg total) by mouth daily at 6 PM.   DULoxetine 60 MG capsule Commonly known  as: CYMBALTA Take 1 capsule (60 mg total) by mouth daily.   gabapentin 300 MG  capsule Commonly known as: NEURONTIN Take 2 capsules (600 mg total) by mouth at bedtime.   isosorbide-hydrALAZINE 20-37.5 MG tablet Commonly known as: BIDIL Take 0.5 tablets by mouth 3 (three) times daily.   lidocaine 5 % Commonly known as: Lidoderm Place 1 patch onto the skin daily. Remove & Discard patch within 12 hours or as directed by MD   mexiletine 200 MG capsule Commonly known as: MEXITIL Take 1 capsule (200 mg total) by mouth every 12 (twelve) hours.   spironolactone 25 MG tablet Commonly known as: ALDACTONE Take 1 tablet (25 mg total) by mouth daily.   torsemide 20 MG tablet Commonly known as: DEMADEX Take 3 tablets (60 mg total) by mouth daily. Start taking on: June 06, 2020 What changed: See the new instructions.       Follow-up Information    [facility] Follow up on 06/14/2020.   Specialty: Cardiology Why: 3:00 PM  [facility]  Contact information: [address] [id number] [place] [phone]              Consultations: Cardiology   Procedures/Studies:  [facility]old  DG Chest 2 View  Result Date: 05/29/2020 CLINICAL DATA:  Chest pain, CHF, AFib EXAM: CHEST - 2 VIEW COMPARISON:  Radiograph 04/18/2020 FINDINGS: Some increasing hazy interstitial opacities with redistributed pulmonary vascularity and central peribronchovascular cuffing as well as some mild fissural and septal thickening. No focal consolidation. No pneumothorax or visible effusion. Borderline cardiomegaly. The aorta is calcified. The remaining cardiomediastinal contours are unremarkable. No acute osseous or soft tissue abnormality. IMPRESSION: Findings suggestive of mild CHF/volume overload with borderline cardiomegaly and interstitial edema. Electronically Signed   By: Lovena Le M.D.   On: 05/29/2020 03:26   CARDIAC CATHETERIZATION  Result Date: 06/04/2020  Ost Ramus to Ramus lesion is 25%  stenosed.  Prox Cx to Mid Cx lesion is 50% stenosed.  Ost LM lesion is 30% stenosed.  Findings: Ao = 119/68 (88) LV = 116/12 RA = 13 RV = 53/12 PA = 54/20 (33) PCW = 22 (v = 35) Fick cardiac output/index = 4.7/2.3 PVR = 2.2 WU Ao sat = 97% PA sat = 57%, 57% Assessment: 1. Mild non-obstructive CAD with unusual origin and course of RCA 2. NICM EF 20% 3. Mild to moderately elevated biventricular filling pressures 4. Mildly reduced CO Plan/Discussion: Continue medical therapy including PVC suppression and ETOH cessation. Glori Bickers, MD 9:28 AM      Subjective: - no chest pain, shortness of breath, no abdominal pain, nausea or vomiting.   Discharge Exam: BP (!) 133/98   Pulse 68   Temp 97.8 F (36.6 C) (Oral)   Resp 18   Ht 5\' 11"  (1.803 m)   Wt 78.9 kg   SpO2 99%   BMI 24.27 kg/m   General: Pt is alert, awake, not in acute distress Cardiovascular: RRR, S1/S2 +, no rubs, no gallops Respiratory: CTA bilaterally, no wheezing, no rhonchi Abdominal: Soft, NT, ND, bowel sounds + Extremities: no edema, no cyanosis    The results of significant diagnostics from this hospitalization (including imaging, microbiology, ancillary and laboratory) are listed below for reference.     Microbiology: Recent Results (from the past 240 hour(s))  SARS Coronavirus 2 by RT PCR (hospital order, performed in [facility] hospital lab) Nasopharyngeal Nasopharyngeal Swab     Status:  None   Collection Time: 05/29/20  5:29 AM   Specimen: Nasopharyngeal Swab  Result Value Ref Range Status   SARS Coronavirus 2 NEGATIVE NEGATIVE Final    Comment: (NOTE) SARS-CoV-2 target nucleic acids are NOT DETECTED. The SARS-CoV-2 RNA is generally detectable in upper and lower respiratory specimens during the acute phase of infection. The lowest concentration of SARS-CoV-2 viral copies this assay can detect is 250 copies / mL. A negative result does not preclude SARS-CoV-2 infection and should not be used as the  sole basis for treatment or other patient management decisions.  A negative result may occur with improper specimen collection / handling, submission of specimen other than nasopharyngeal swab, presence of viral mutation(s) within the areas targeted by this assay, and inadequate number of viral copies (<250 copies / mL). A negative result must be combined with clinical observations, patient history, and epidemiological information. Fact Sheet for Patients:   StrictlyIdeas.no Fact Sheet for Healthcare Providers: BankingDealers.co.za This test is not yet approved or cleared  by the [place] FDA and has been authorized for detection and/or diagnosis of SARS-CoV-2 by FDA under an Emergency Use Authorization (EUA).  This EUA will remain in effect (meaning this test can be used) for the duration of the COVID-19 declaration under Section 564(b)(1) of the Act, 21 U.S.C. section 360bbb-3(b)(1), unless the authorization is terminated or revoked sooner. Performed at [facility], [place] [address], [place], [place] [address]      Labs: Basic Metabolic Panel: Recent Labs  Lab 06/01/20 [phone] 06/01/20 0749 06/02/20 0006 06/02/20 0820 06/03/20 0750 06/03/20 1445 06/04/20 0006 06/04/20 0006 06/04/20 2992 06/04/20 0802 06/04/20 4268 06/04/20 0808 06/04/20 0902 06/05/20 0326  NA 122*   < > 123*   < > 132*   < > 134*   < > 136 143 139 144  --  133*  K 4.1   < > 4.3  --  4.5  --  4.6  --   --  3.9 4.5 3.8  --  4.7  CL 89*  --  91*  --  98  --  100  --   --   --   --   --   --  98  CO2 24  --  23  --  26  --  26  --   --   --   --   --   --  26  GLUCOSE 134*  --  138*  --  133*  --  145*  --   --   --   --   --   --  121*  BUN 32*  --  33*  --  35*  --  34*  --   --   --   --   --   --  28*  CREATININE 1.73*   < > 1.58*  --  1.76*  --  1.59*  --   --   --   --   --  1.68* 1.56*  CALCIUM 8.5*  --  8.1*  --  8.6*  --  8.8*  --    --   --   --   --   --  8.9  MG 1.8  --  1.9  --  2.0  --  1.8  --   --   --   --   --   --  1.9   < > = values in this interval not displayed.  Liver Function Tests: No results for input(s): AST, ALT, ALKPHOS, BILITOT, PROT, ALBUMIN in the last 168 hours. CBC: Recent Labs  Lab 05/31/20 0424 05/31/20 0424 06/04/20 0802 06/04/20 0807 06/04/20 0808 06/04/20 0902 06/05/20 0326  WBC 2.6*  --   --   --   --  3.7* 4.0  NEUTROABS 1.8  --   --   --   --   --   --   HGB 10.5*   < > 9.9* 10.9* 9.9* 10.6* 10.3*  HCT 30.7*   < > 29.0* 32.0* 29.0* 32.2* 31.6*  MCV 89.5  --   --   --   --  94.2 93.5  PLT 147*  --   --   --   --  163 159   < > = values in this interval not displayed.   CBG: Recent Labs  Lab 06/03/20 2138 06/04/20 1155 06/04/20 1620 06/04/20 2110 06/05/20 0812  GLUCAP 193* 195* 178* 144* 114*   Hgb A1c No results for input(s): HGBA1C in the last 72 hours. Lipid Profile No results for input(s): CHOL, HDL, LDLCALC, TRIG, CHOLHDL, LDLDIRECT in the last 72 hours. Thyroid function studies No results for input(s): TSH, T4TOTAL, T3FREE, THYROIDAB in the last 72 hours.  Invalid input(s): FREET3 Urinalysis    Component Value Date/Time   COLORURINE STRAW (A) 03/30/2020 1928   APPEARANCEUR CLEAR 03/30/2020 1928   LABSPEC 1.002 (L) 03/30/2020 1928   PHURINE 6.0 03/30/2020 1928   GLUCOSEU NEGATIVE 03/30/2020 1928   HGBUR NEGATIVE 03/30/2020 1928   BILIRUBINUR NEGATIVE 03/30/2020 1928   BILIRUBINUR neg 05/08/2017 1503   KETONESUR NEGATIVE 03/30/2020 1928   PROTEINUR 30 (A) 03/30/2020 1928   UROBILINOGEN 0.2 05/08/2017 1503   UROBILINOGEN 1.0 04/26/2012 1328   NITRITE NEGATIVE 03/30/2020 1928   LEUKOCYTESUR NEGATIVE 03/30/2020 1928    FURTHER DISCHARGE INSTRUCTIONS:   Get Medicines reviewed and adjusted: Please take all your medications with you for your next visit with your Primary MD   Laboratory/radiological data: Please request your Primary MD to go over  all hospital tests and procedure/radiological results at the follow up, please ask your Primary MD to get all Hospital records sent to his/her office.   In some cases, they will be blood work, cultures and biopsy results pending at the time of your discharge. Please request that your primary care M.D. goes through all the records of your hospital data and follows up on these results.   Also Note the following: If you experience worsening of your admission symptoms, develop shortness of breath, life threatening emergency, suicidal or homicidal thoughts you must seek medical attention immediately by calling 911 or calling your MD immediately  if symptoms less severe.   You must read complete instructions/literature along with all the possible adverse reactions/side effects for all the Medicines you take and that have been prescribed to you. Take any new Medicines after you have completely understood and accpet all the possible adverse reactions/side effects.    Do not drive when taking Pain medications or sleeping medications (Benzodaizepines)   Do not take more than prescribed Pain, Sleep and Anxiety Medications. It is not advisable to combine anxiety,sleep and pain medications without talking with your primary care practitioner   Special Instructions: If you have smoked or chewed Tobacco  in the last 2 yrs please stop smoking, stop any regular Alcohol  and or any Recreational drug use.   Wear Seat belts while driving.   Please note: You were cared for  by a hospitalist during your hospital stay. Once you are discharged, your primary care physician will handle any further medical issues. Please note that NO REFILLS for any discharge medications will be authorized once you are discharged, as it is imperative that you return to your primary care physician (or establish a relationship with a primary care physician if you do not have one) for your post hospital discharge needs so that they can reassess  your need for medications and monitor your lab values.  Time coordinating discharge: 40 minutes  SIGNED:  Marzetta Board, MD, PhD 06/05/2020, 10:01 AM

## 2020-06-05 NOTE — TOC Transition Note (Addendum)
Transition of Care [facility]) - CM/SW Discharge Note   Patient Details  Name: Brad [name] MRN: [id number] Date of Birth: 10-26-1961  [facility] of Care [facility] CM/SW Contact:  Trula Ore, [place] Phone Number: 06/05/2020, 11:23 AM   Clinical Narrative:     Patient will DC to: Home: [address] [place] [address]  Anticipated DC date: 06/05/2020  Family notified: Valere Dross  Transport by: Melburn Popper ?  Per MD patient ready for DC to Home. RN, patient, patient's family, notified of DC. DC packet on chart. Melburn Popper requested for patient.  CSW signing off.  Final next level of care: Home/Self Care Barriers to Discharge: No Barriers Identified   Patient Goals and CMS Choice Patient states their goals for this hospitalization and ongoing recovery are:: to go home CMS Medicare.gov Compare Post Acute Care list provided to:: Patient Choice offered to / list presented to : Patient  Discharge Placement              Patient chooses bed at:  (patient is going home) Patient to be transferred to facility by: [place] Name of family member notified: Valere Dross Patient and family notified of of transfer: 06/05/20  Discharge Plan and Services                                     Social Determinants of Health (SDOH) Interventions     Readmission Risk Interventions Readmission Risk Prevention Plan 06/01/2020 04/20/2020 03/26/2020  Transportation Screening Complete Complete Complete  PCP or Specialist Appt within 5-7 Days - - Complete  Home Care Screening - - Complete  Medication Review (RN CM) - - Complete  Medication Review Press photographer) Complete Complete -  PCP or Specialist appointment within 3-5 days of discharge - - -  PCP/Specialist Appt Not Complete comments - - -  [place] or Home Care Consult Complete Complete -  [place] or Home Care Consult Pt Refusal Comments - - -  SW Recovery Care/Counseling Consult Complete Complete -  Palliative Care Screening Not Applicable Not  Applicable -  [place] Not Applicable Not Applicable -  Some recent data might be hidden

## 2020-06-05 NOTE — Care Management (Signed)
5301 06-05-20 Case Manager received consult for Bidil. Patient has Medicaid- cost should be no more than $3.00. No further needs from Case Manager. Bethena Roys, RN,BSN Case Manager

## 2020-06-07 ENCOUNTER — Telehealth: Payer: Self-pay

## 2020-06-07 ENCOUNTER — Other Ambulatory Visit (HOSPITAL_COMMUNITY): Payer: Self-pay

## 2020-06-07 NOTE — Telephone Encounter (Signed)
Late entry  Transition Care Management Follow-up Telephone Call Date of discharge and from where: 06/05/2020 Brad [name]  Called pt on 06/06/2020 at 4:51 pm at [phone] [name])  [phone] [name] Phone) unable to reach/ Left voice message to call back. Name and phone nr provided.  2 nd attempt  Called patient again today / unable to reach / left VM to return the call. Name and phone nr provided.

## 2020-06-07 NOTE — Progress Notes (Signed)
Paramedicine Encounter    Patient ID: Brad [name], male    DOB: [birth year], 59 y.o.   MRN: [id number]   Patient Care Team: Charlott Rakes, MD as PCP - General (Family Medicine) Debara Pickett Nadean Corwin, MD as PCP - Cardiology (Cardiology) Charlott Rakes, MD (Family Medicine) Charlott Rakes, MD (Family Medicine)  Patient Active Problem List   Diagnosis Date Noted  . Acute exacerbation of CHF (congestive heart failure) ([place]) 05/29/2020  . Chronic combined systolic and diastolic CHF (congestive heart failure) ([place]) 04/19/2020  . Alcohol abuse with intoxication ([place]) 03/23/2020  . Hyponatremia 03/23/2020  . Fall 03/23/2020  . Normocytic anemia 03/23/2020  . Leukopenia 03/23/2020  . HTN (hypertension) 03/23/2020  . HLD (hyperlipidemia) 03/23/2020  . AKI (acute kidney injury) ([place]) 03/23/2020  . CHF (congestive heart failure) ([place]) 03/23/2020  . Alcohol abuse with intoxication ([place]) 03/05/2020  . Chronic systolic CHF (congestive heart failure) ([place]) 10/31/2019  . Acute systolic CHF (congestive heart failure) ([place]) 08/02/2019  . Diarrhea 07/29/2019  . Gastroenteritis 07/22/2019  . Hypomagnesemia 06/19/2019  . Chest pain 06/07/2019  . Elevated troponin 05/23/2019  . Tobacco abuse 05/23/2019  . Alcohol abuse 02/21/2019  . Frequent falls 01/17/2019  . Severe mitral regurgitation   . Fall 11/01/2018  . Hyponatremia 10/31/2018  . Chronic atrial fibrillation   . Chronic hyponatremia 08/16/2018  . NSVT (nonsustained ventricular tachycardia) ([place])   . Concussion with loss of consciousness   . Scalp laceration   . Trauma   . Permanent atrial fibrillation   . Hypertensive heart disease   . Shortness of breath   . Pyogenic inflammation of bone ([place])   . Cirrhosis ([place]) 03/23/2018  . Right ankle pain 03/23/2018  . Syncope 08/07/2017  . Acute on chronic combined systolic and diastolic CHF (congestive heart failure) ([place]) 07/09/2017  . Atrial flutter ([place]) 07/09/2017  . Pre-syncope  07/08/2017  . Neuropathy 05/08/2017  . Substance induced mood disorder ([place]) 10/06/2016  . CVA (cerebral vascular accident) ([place]) 09/18/2016  . Left sided numbness   . Homelessness 08/21/2016  . S/P ORIF (open reduction internal fixation) fracture 08/01/2016  . CAD (coronary artery disease), native coronary artery 07/30/2016  . Surgery, elective   . Insomnia 07/22/2016  . NSTEMI (non-ST elevated myocardial infarction) ([place])   . Anemia 07/05/2016  . Thrombocytopenia ([place]) 07/05/2016  . Cocaine abuse ([place]) 07/02/2016  . Chest pain on breathing 07/01/2016  . Essential hypertension 07/01/2016  . DM type 2 (diabetes mellitus, type 2) ([place]) 07/01/2016  . Hypokalemia 07/01/2016  . CKD (chronic kidney disease) stage 3, GFR 30-59 ml/min 07/01/2016  . Painful diabetic neuropathy ([place]) 07/01/2016  . Acute hyponatremia 05/27/2016  . Polysubstance abuse ([place]) 05/27/2016  . Chronic hepatitis C with cirrhosis ([place]) 05/27/2016  . Depression 04/21/2012  . GERD (gastroesophageal reflux disease) 02/16/2012  . History of drug abuse ([place])   . Heroin addiction ([place]) 01/29/2012    Current Outpatient Medications:  .  albuterol (VENTOLIN HFA) 108 (90 Base) MCG/ACT inhaler, Inhale 2 puffs into the lungs every 6 (six) hours as needed for shortness of breath., Disp: 18 g, Rfl: 0 .  atorvastatin (LIPITOR) 20 MG tablet, Take 1 tablet (20 mg total) by mouth daily at 6 PM., Disp: 30 tablet, Rfl: 1 .  DULoxetine (CYMBALTA) 60 MG capsule, Take 1 capsule (60 mg total) by mouth daily., Disp: 30 capsule, Rfl: 6 .  gabapentin (NEURONTIN) 300 MG capsule, Take 2 capsules (600 mg total) by mouth at bedtime., Disp: 60 capsule, Rfl: 3 .  isosorbide-hydrALAZINE (BIDIL) 20-37.5 MG tablet, Take 0.5 tablets by mouth 3 (three) times daily., Disp: 45 tablet, Rfl: 1 .  mexiletine (MEXITIL) 200 MG capsule, Take 1 capsule (200 mg total) by mouth every 12 (twelve) hours., Disp: 60 capsule, Rfl: 1 .  spironolactone (ALDACTONE) 25 MG  tablet, Take 1 tablet (25 mg total) by mouth daily., Disp: 30 tablet, Rfl: 1 .  torsemide (DEMADEX) 20 MG tablet, Take 3 tablets (60 mg total) by mouth daily., Disp: 90 tablet, Rfl: 1 .  lidocaine (LIDODERM) 5 %, Place 1 patch onto the skin daily. Remove & Discard patch within 12 hours or as directed by MD (Patient not taking: Reported on 06/07/2020), Disp: 15 patch, Rfl: 0 Allergies  Allergen Reactions  . Angiotensin Receptor Blockers Anaphylaxis and Other (See Comments)    (Angioedema also with Lisinopril, therefore ARB's are contraindicated)  . Lisinopril Anaphylaxis and Swelling    Throat swelling  . Pamelor [Nortriptyline Hcl] Anaphylaxis and Swelling    Throat swells     Social History   Socioeconomic History  . Marital status: Single    Spouse name: Not on file  . Number of children: 3  . Years of education: 2y college  . Highest education level: Not on file  Occupational History  . 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 Smoking status: Current Every Day Smoker    Packs/day: 0.50    Years: 28.00    Pack years: 14.00    Types: Cigarettes  . Smokeless tobacco: Never Used  Vaping Use  . Vaping Use: Never used  Substance and Sexual Activity  . Alcohol use: Yes    Alcohol/week: 25.0 standard drinks    Types: 25 Cans of beer per week    Comment: "depends on the day"; reports not drinking every day, "I stopped about 2-3 wekes ago" - but drank yesterday  . Drug use: Not Currently    Types: IV, Cocaine, Heroin    Comment: 08/17/2018 "no heroin since 2013; I use cocaine ~ once/month, last use 03/21/2019  . Sexual activity: Not Currently  Other Topics Concern  . Not on file  Social History Narrative   ** Merged History Encounter **       ** Merged History Encounter **       Incarcerated from 2006-2010, then 10/2011-12/2011.  Has been trying to get sober (no heroin, alcohol since 10/2011).    Social  Determinants of Health   Financial Resource Strain:   . Difficulty of Paying Living Expenses:   Food Insecurity: No Food Insecurity  . Worried About Charity fundraiser in the Last Year: Never true  . Ran Out of Food in the Last Year: Never true  Transportation Needs:   . Lack of Transportation (Medical):   Marland Kitchen Lack of Transportation (Non-Medical):   Physical Activity:   . Days of Exercise per Week:   . Minutes of Exercise per Session:   Stress:   . Feeling of Stress :   Social Connections:   . Frequency of Communication with Friends and Family:   . Frequency of Social Gatherings with Friends and Family:   . Attends Religious Services:   . Active Member of Clubs or Organizations:   . Attends Archivist Meetings:   Marland Kitchen Marital Status:   Intimate Partner Violence:   . Fear of Current or Ex-Partner:   . Emotionally Abused:   Marland Kitchen Physically Abused:   .  Sexually Abused:     Physical Exam Constitutional:      Appearance: He is normal weight.  HENT:     Head: Normocephalic.     Nose: Nose normal.     Mouth/Throat:     Mouth: Mucous membranes are moist.  Eyes:     Pupils: Pupils are equal, round, and reactive to light.  Cardiovascular:     Rate and Rhythm: Normal rate and regular rhythm.     Pulses: Normal pulses.     Heart sounds: Normal heart sounds.  Pulmonary:     Effort: Pulmonary effort is normal.     Breath sounds: Normal breath sounds.  Abdominal:     Palpations: Abdomen is soft.  Musculoskeletal:        General: Normal range of motion.     Right lower leg: Edema present.     Left lower leg: Edema present.  Skin:    General: Skin is warm and dry.     Capillary Refill: Capillary refill takes less than 2 seconds.  Neurological:     Mental Status: He is alert. Mental status is at baseline.  Psychiatric:        Mood and Affect: Mood normal.    Arrived for home visit for [place] where he was ambulating with his cane without increased shortness of breath.  Christohper was seated in a chair and vitals were obtained. Hilario denies any chest pain, shortness of breath, dizziness, falls or complaints since discharge. Chart and notes were reviewed. Vitaly is being re-enrolled under HF clinic to paramedicine and agrees to weekly visits on Thursday mornings. Medications were confirmed and pill box was filled accordingly. Dravon was given his morning daily meds administered by me. Sherill noted to have a cough with no mucus production. Lung sounds clear. Legs noted to have some mild edema but Demetrias reports he has not had his medications in two days due to not knowing what and how to take them. I reviewed medication box and pills with Frederic Jericho, Jimmye Norman roommate was present and agreed to assist Gwyndolyn Saxon in remembering to take medications. Rabon was reminded of upcoming appointment at HF clinic next week and that we would meet there instead of home next week. He agreed with same. Miron reports he is wanting to find different housing due to bed bug infestation at his current home. I advised him I would discuss same with Eliezer Lofts to see what options we can seek. He reports he can afford up to $550 per month. John was made aware to call me if needed before next weeks visit. Home visit complete.   Refills: NONE     Future Appointments  Date Time Provider [place]  06/14/2020  3:00 PM [facility] PA/NP [facility] None     ACTION: Home visit completed Next visit planned for one week

## 2020-06-08 IMAGING — CT CT HEAD WITHOUT CONTRAST
4 series · 15 of 47 positions shown, 17 images · non-contrast
Comparison: Head and cervical spine CT 08/16/2018

CLINICAL DATA: Altered level of consciousness (LOC), unexplained;
C-spine trauma, high clinical risk (NEXUS/CCR)

EXAM:
CT HEAD WITHOUT CONTRAST
CT CERVICAL SPINE WITHOUT CONTRAST
TECHNIQUE: Multidetector CT imaging of the head and cervical spine was
performed following the standard protocol without intravenous
contrast. Multiplanar CT image reconstructions of the cervical spine
were also generated.

[Series 3: head without · axial · non-contrast · 0.46mm/px · z∈[+380,+530]mm · 7 of 41 slices shown, 9 images]
[im 6/41  brain]
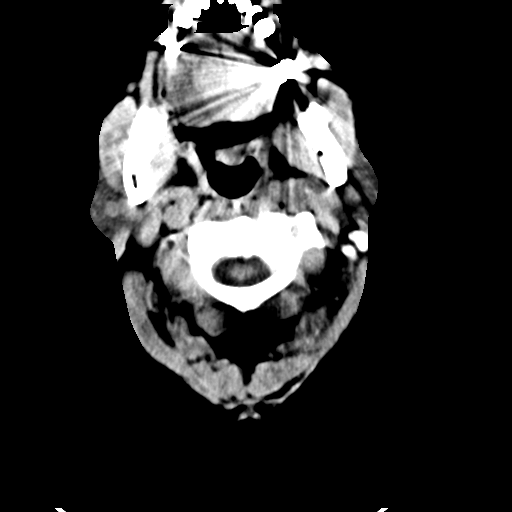
[im 6/41  bone]
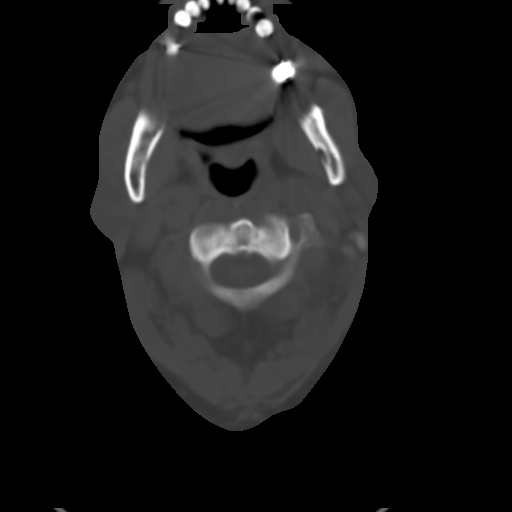
[im 11/41  brain]
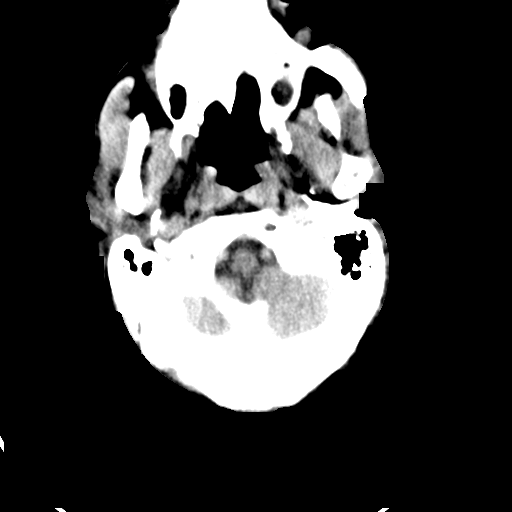
[im 16/41  brain]
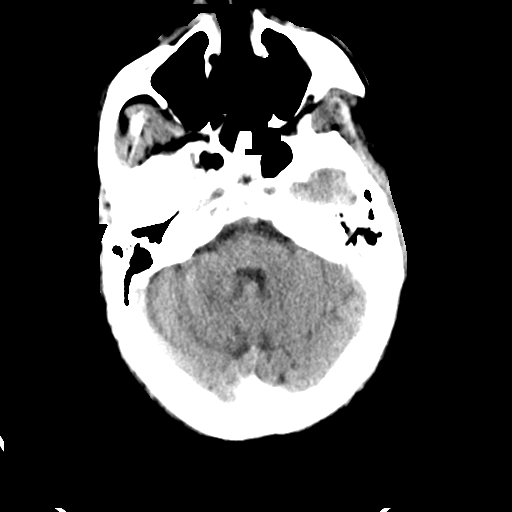
[im 21/41  brain]
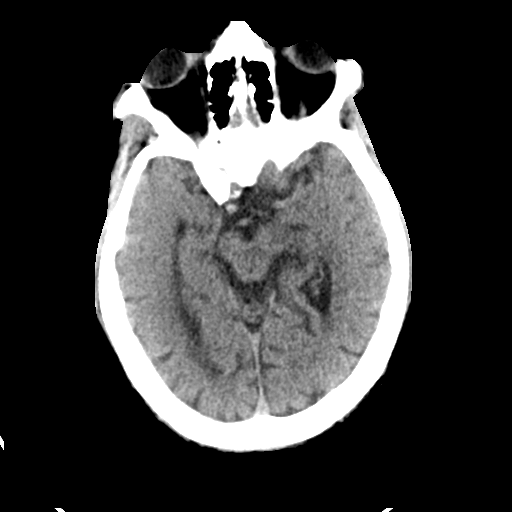
[im 26/41  brain]
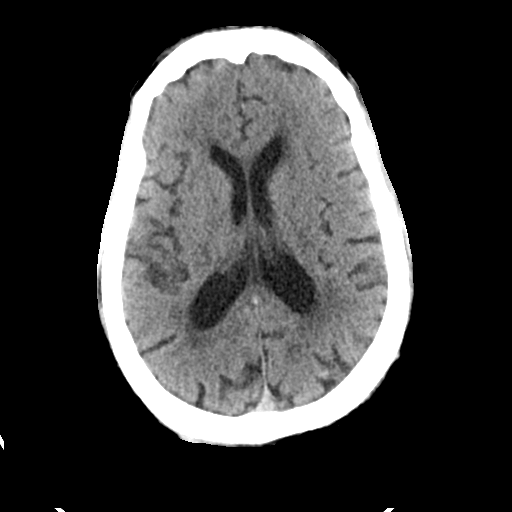
[im 26/41  bone]
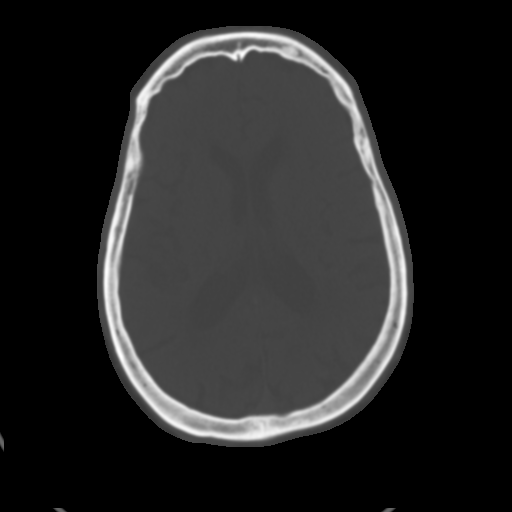
[im 31/41  brain]
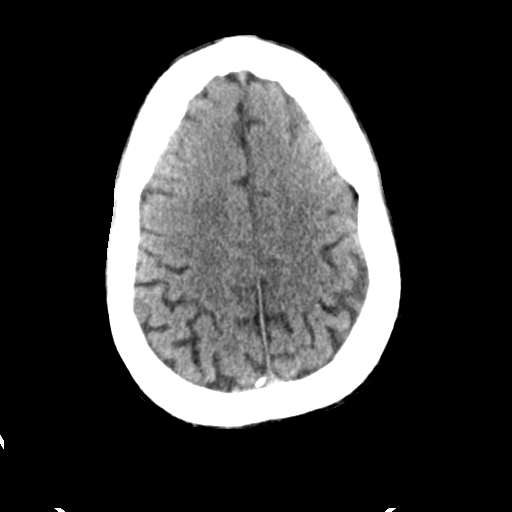
[im 36/41  brain]
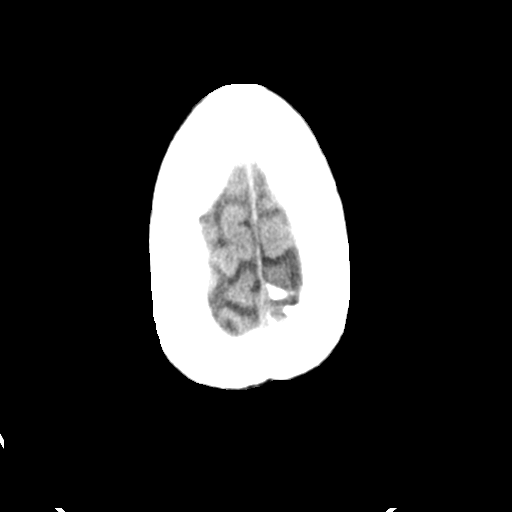

[Series 4: head bone · axial · 0.46mm/px · z∈[+376,+396]mm · 2 of 102 slices shown]
[im 11/102  bone]
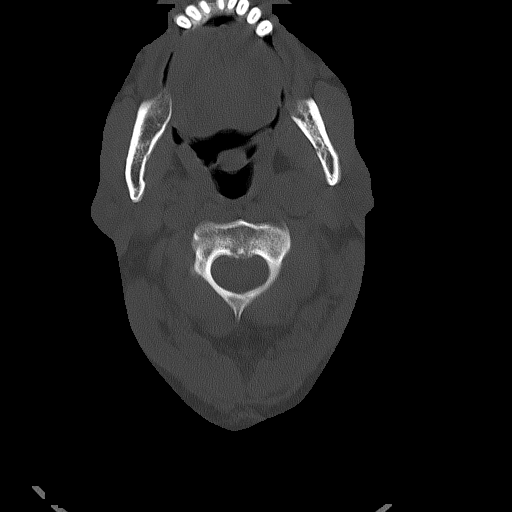
[im 21/102  bone]
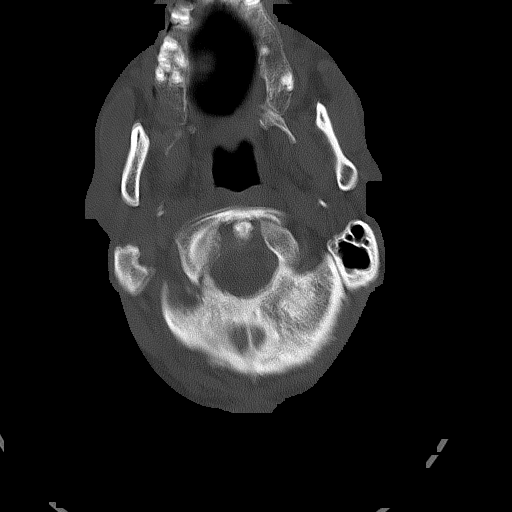

[Series 5: head without cor · coronal · non-contrast · 0.39mm/px · 3 of 75 slices shown]
[im 25/75  brain]
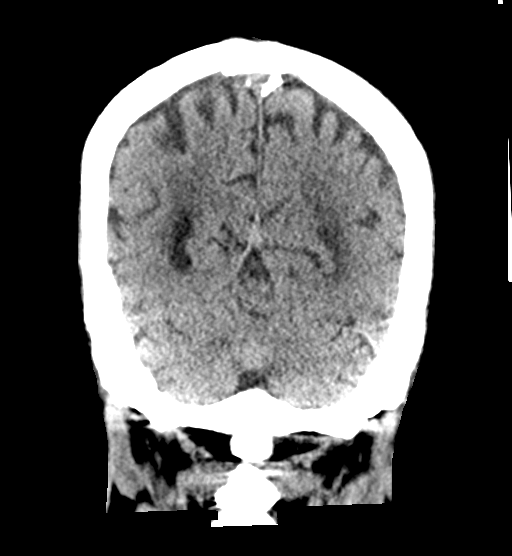
[im 33/75  brain]
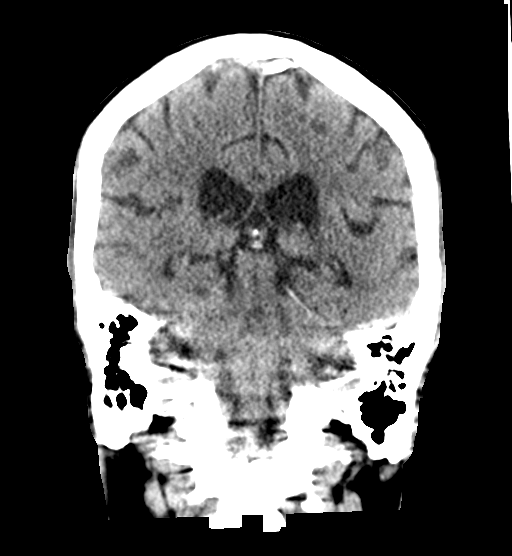
[im 42/75  brain]
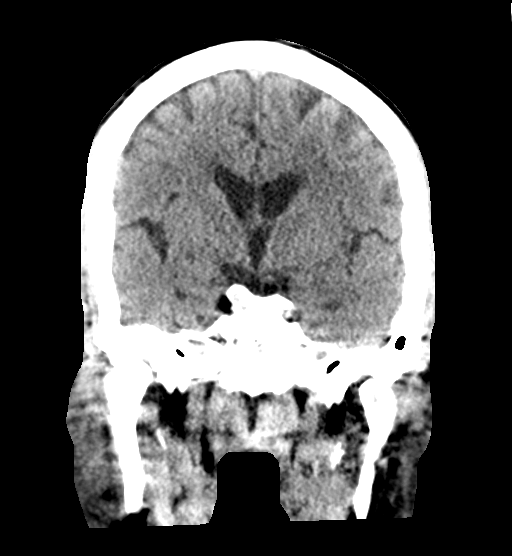

[Series 6: head without sag · sagittal · non-contrast · 0.37mm/px · 3 of 61 slices shown]
[im 21/61  brain]
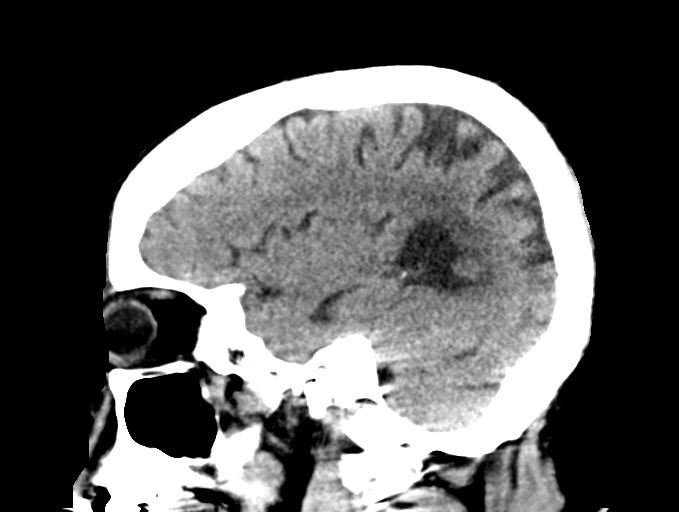
[im 31/61  brain]
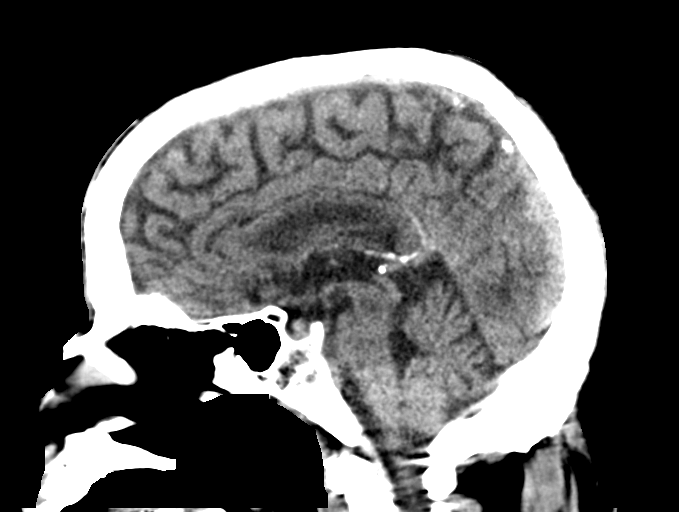
[im 41/61  brain]
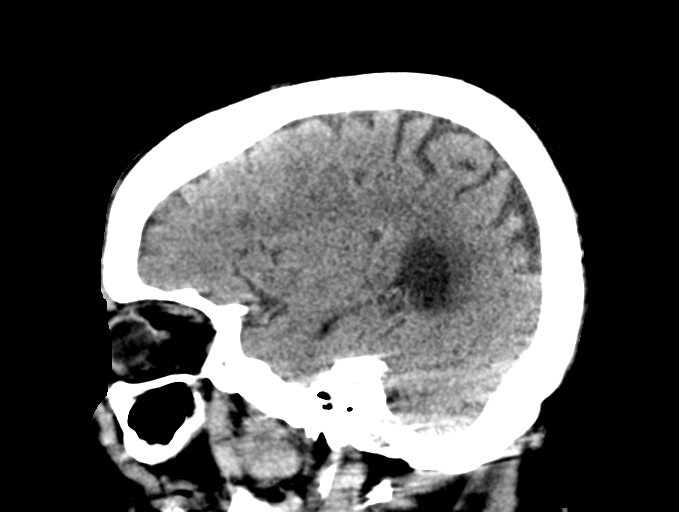

[15 of 47 positions shown; findings below may reference images not displayed]

FINDINGS: Mild motion artifact in the head and cervical spine.

CT HEAD FINDINGS

Brain: Similar degree of atrophy and chronic small vessel ischemia
from prior. No intracranial hemorrhage, mass effect, or midline
shift. No hydrocephalus. The basilar cisterns are patent. No
evidence of territorial infarct or acute ischemia. No extra-axial or
intracranial fluid collection.

Vascular: Atherosclerosis of skullbase vasculature without
hyperdense vessel or abnormal calcification.

Skull: No fracture or focal lesion.

Sinuses/Orbits: Paranasal sinuses and mastoid air cells are clear.
The visualized orbits are unremarkable.

Other: None.

CT CERVICAL SPINE FINDINGS

Alignment: No traumatic subluxation. Reversal of normal cervical
lordosis.

Skull base and vertebrae: No acute fracture, detailed evaluation
slightly limited by motion. Vertebral body heights are maintained.
The dens and skull base are intact, skull base better assessed on
concurrent head CT.

Soft tissues and spinal canal: No prevertebral fluid or swelling. No
visible canal hematoma.

Disc levels: Multilevel disc space narrowing and endplate spurring
most prominent at C5-C6. scattered facet arthropathy.

Upper chest: Bilateral and pleural effusions and pulmonary edema,
concordant with chest radiograph earlier this day.

Other: Carotid atherosclerosis.
IMPRESSION: 1. No acute intracranial abnormality. No skull fracture. Stable
atrophy and chronic small vessel ischemia.
2. Multilevel degenerative change in the cervical spine without
acute fracture or subluxation, allowing for mild motion artifact.
3. Carotid and skullbase atherosclerosis.

## 2020-06-08 IMAGING — CT CT CERVICAL SPINE WITHOUT CONTRAST
3 of 4 series · 13 of 33 positions shown, 16 images · non-contrast
Comparison: Head and cervical spine CT 08/16/2018

CLINICAL DATA: Altered level of consciousness (LOC), unexplained;
C-spine trauma, high clinical risk (NEXUS/CCR)

EXAM:
CT HEAD WITHOUT CONTRAST
CT CERVICAL SPINE WITHOUT CONTRAST
TECHNIQUE: Multidetector CT imaging of the head and cervical spine was
performed following the standard protocol without intravenous
contrast. Multiplanar CT image reconstructions of the cervical spine
were also generated.

[Series 6: c_spine 2.0 sag bone · sagittal · 0.33mm/px · 5 of 61 slices shown, 6 images]
[im 21/61  bone]
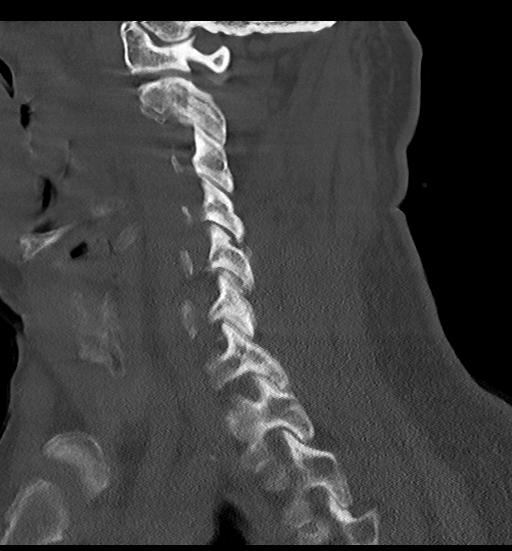
[im 26/61  bone]
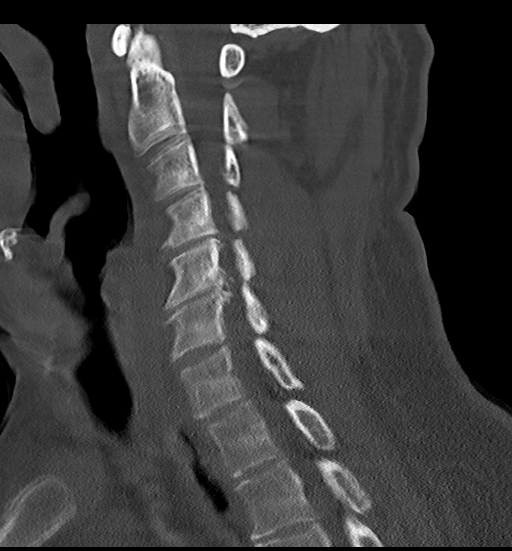
[im 31/61  soft-tissue]
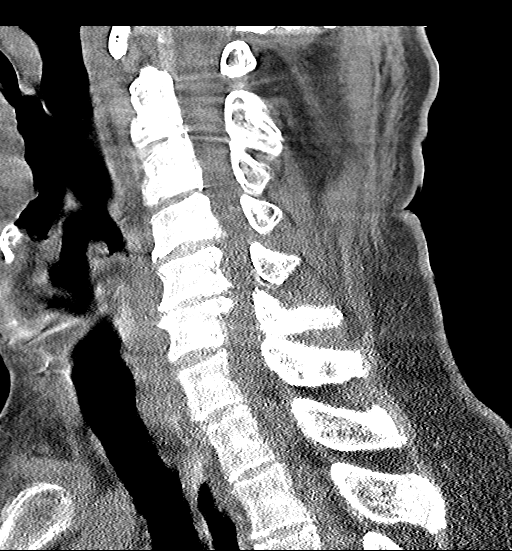
[im 31/61  bone]
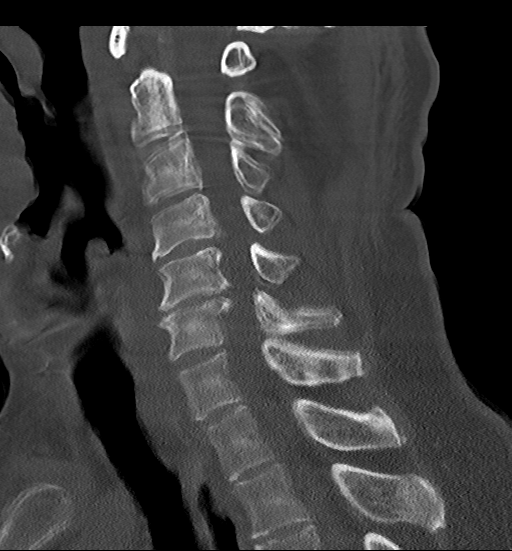
[im 36/61  bone]
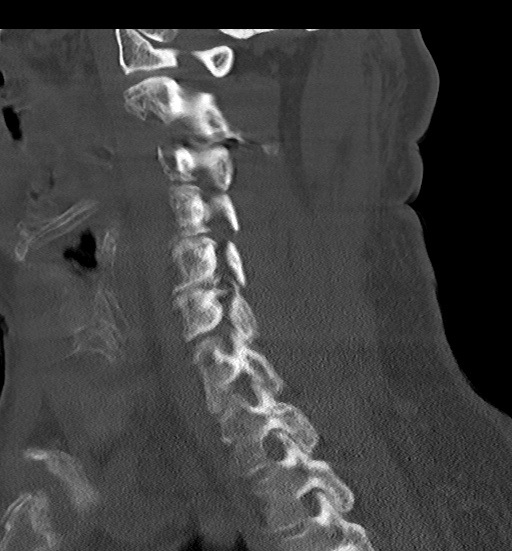
[im 41/61  bone]
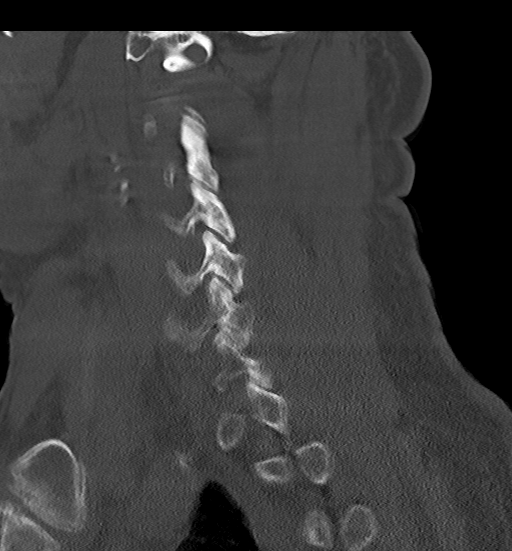

[Series 7: c_spine 2.0 cor bone · coronal · 0.23mm/px · 3 of 81 slices shown]
[im 17/81  bone]
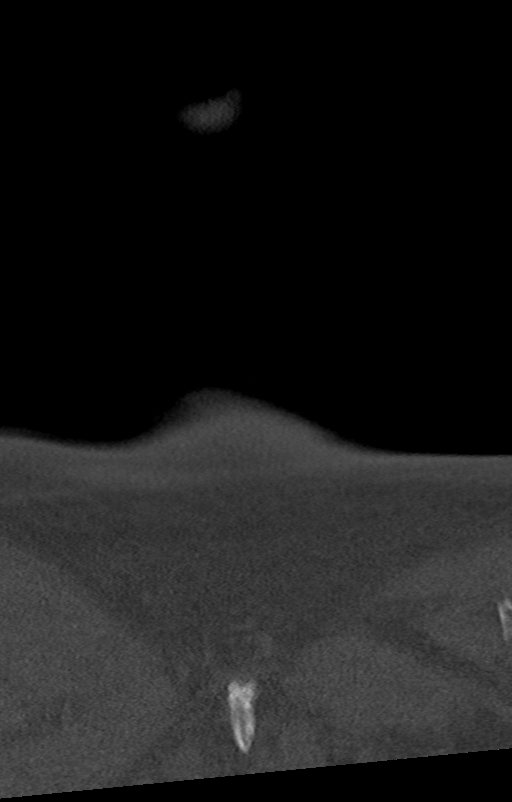
[im 33/81  bone]
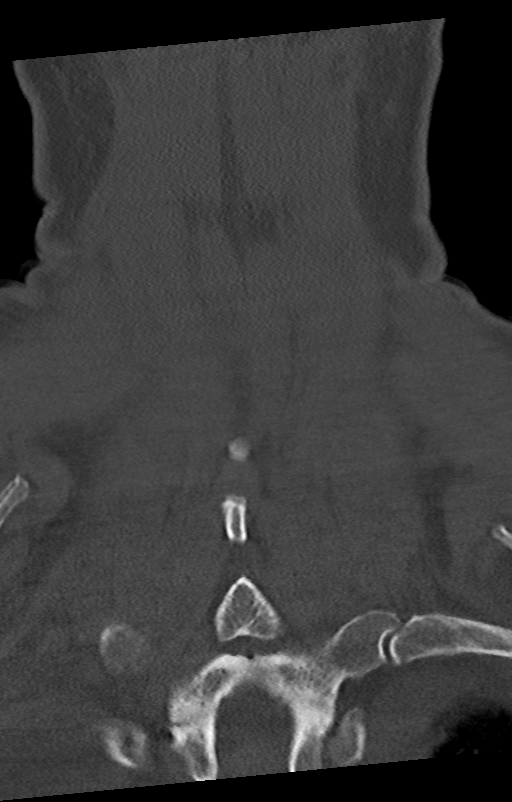
[im 49/81  bone]
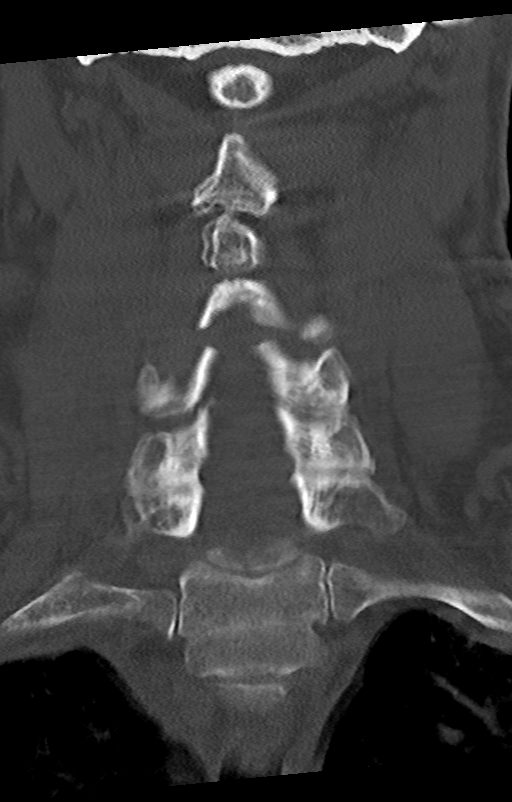

[Series 8: c_spine 2.0 orthogonals · axial · 0.21mm/px · z∈[+246,+373]mm · 5 of 95 slices shown, 7 images]
[im 16/95  soft-tissue]
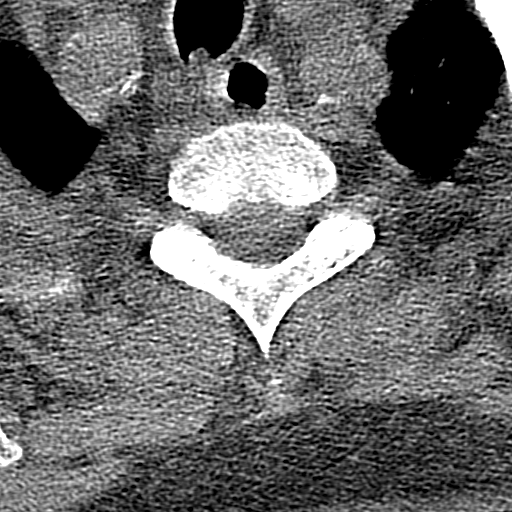
[im 16/95  bone]
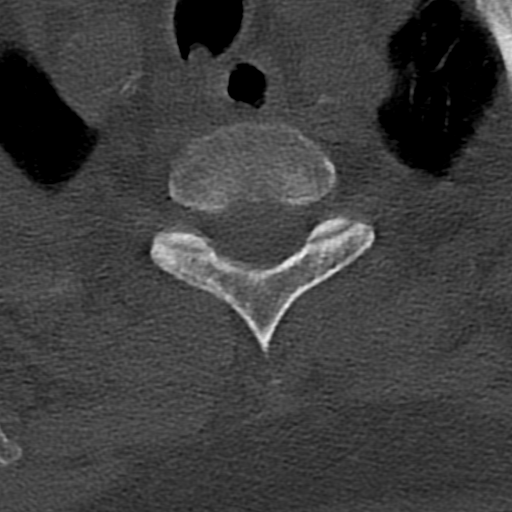
[im 32/95  bone]
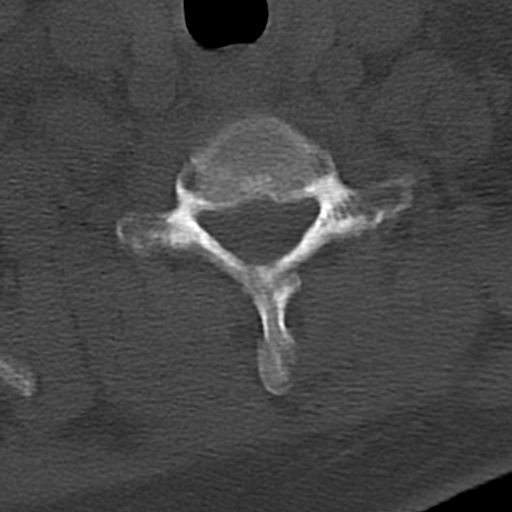
[im 48/95  bone]
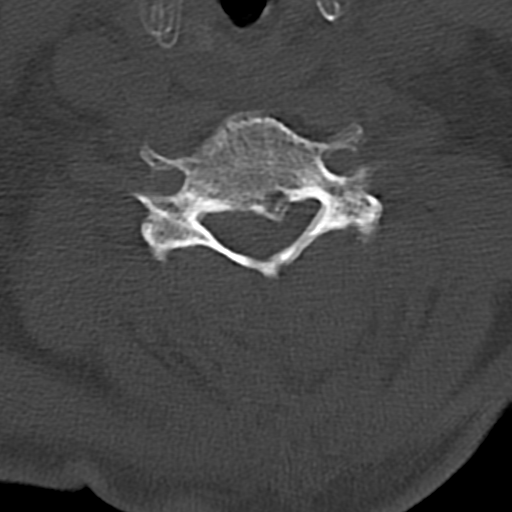
[im 63/95  bone]
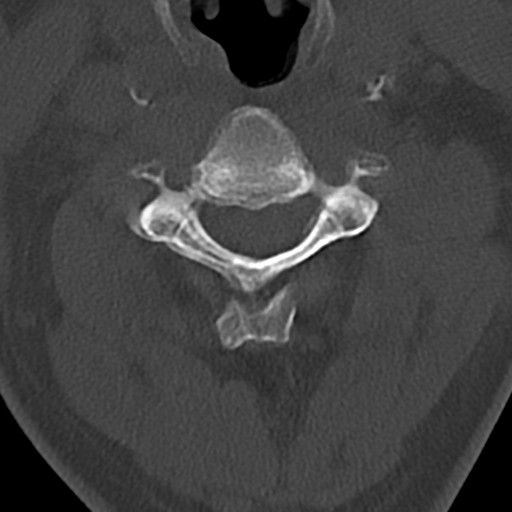
[im 79/95  soft-tissue]
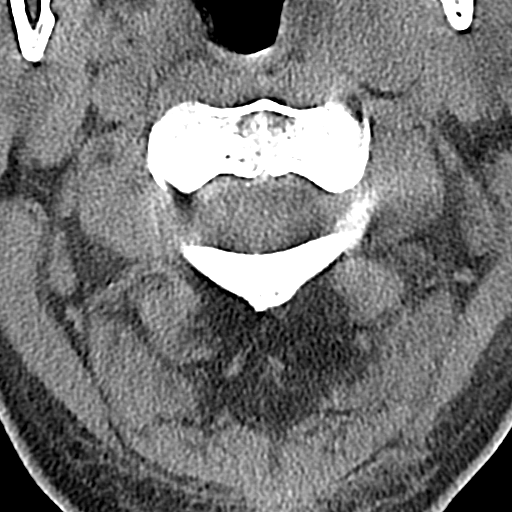
[im 79/95  bone]
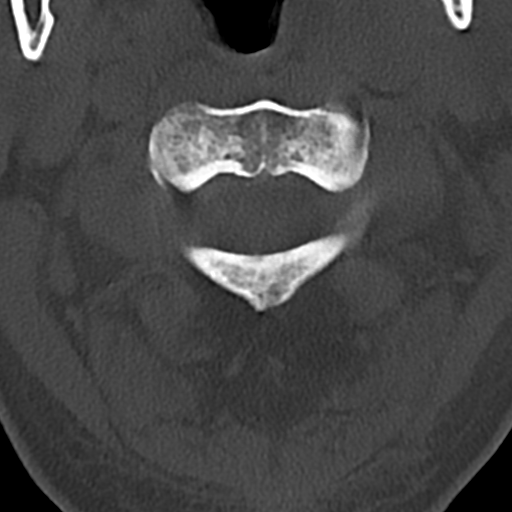

[13 of 33 positions shown; findings below may reference images not displayed]

FINDINGS: Mild motion artifact in the head and cervical spine.

CT HEAD FINDINGS

Brain: Similar degree of atrophy and chronic small vessel ischemia
from prior. No intracranial hemorrhage, mass effect, or midline
shift. No hydrocephalus. The basilar cisterns are patent. No
evidence of territorial infarct or acute ischemia. No extra-axial or
intracranial fluid collection.

Vascular: Atherosclerosis of skullbase vasculature without
hyperdense vessel or abnormal calcification.

Skull: No fracture or focal lesion.

Sinuses/Orbits: Paranasal sinuses and mastoid air cells are clear.
The visualized orbits are unremarkable.

Other: None.

CT CERVICAL SPINE FINDINGS

Alignment: No traumatic subluxation. Reversal of normal cervical
lordosis.

Skull base and vertebrae: No acute fracture, detailed evaluation
slightly limited by motion. Vertebral body heights are maintained.
The dens and skull base are intact, skull base better assessed on
concurrent head CT.

Soft tissues and spinal canal: No prevertebral fluid or swelling. No
visible canal hematoma.

Disc levels: Multilevel disc space narrowing and endplate spurring
most prominent at C5-C6. scattered facet arthropathy.

Upper chest: Bilateral and pleural effusions and pulmonary edema,
concordant with chest radiograph earlier this day.

Other: Carotid atherosclerosis.
IMPRESSION: 1. No acute intracranial abnormality. No skull fracture. Stable
atrophy and chronic small vessel ischemia.
2. Multilevel degenerative change in the cervical spine without
acute fracture or subluxation, allowing for mild motion artifact.
3. Carotid and skullbase atherosclerosis.

## 2020-06-08 IMAGING — DX CHEST - 2 VIEW
2 series · 2 of 2 positions shown · non-contrast
Comparison: CT dated 05/27/2019.  Chest x-ray dated 05/23/2019.

CLINICAL DATA: Chest pain

EXAM:
CHEST - 2 VIEW

[chest lat]
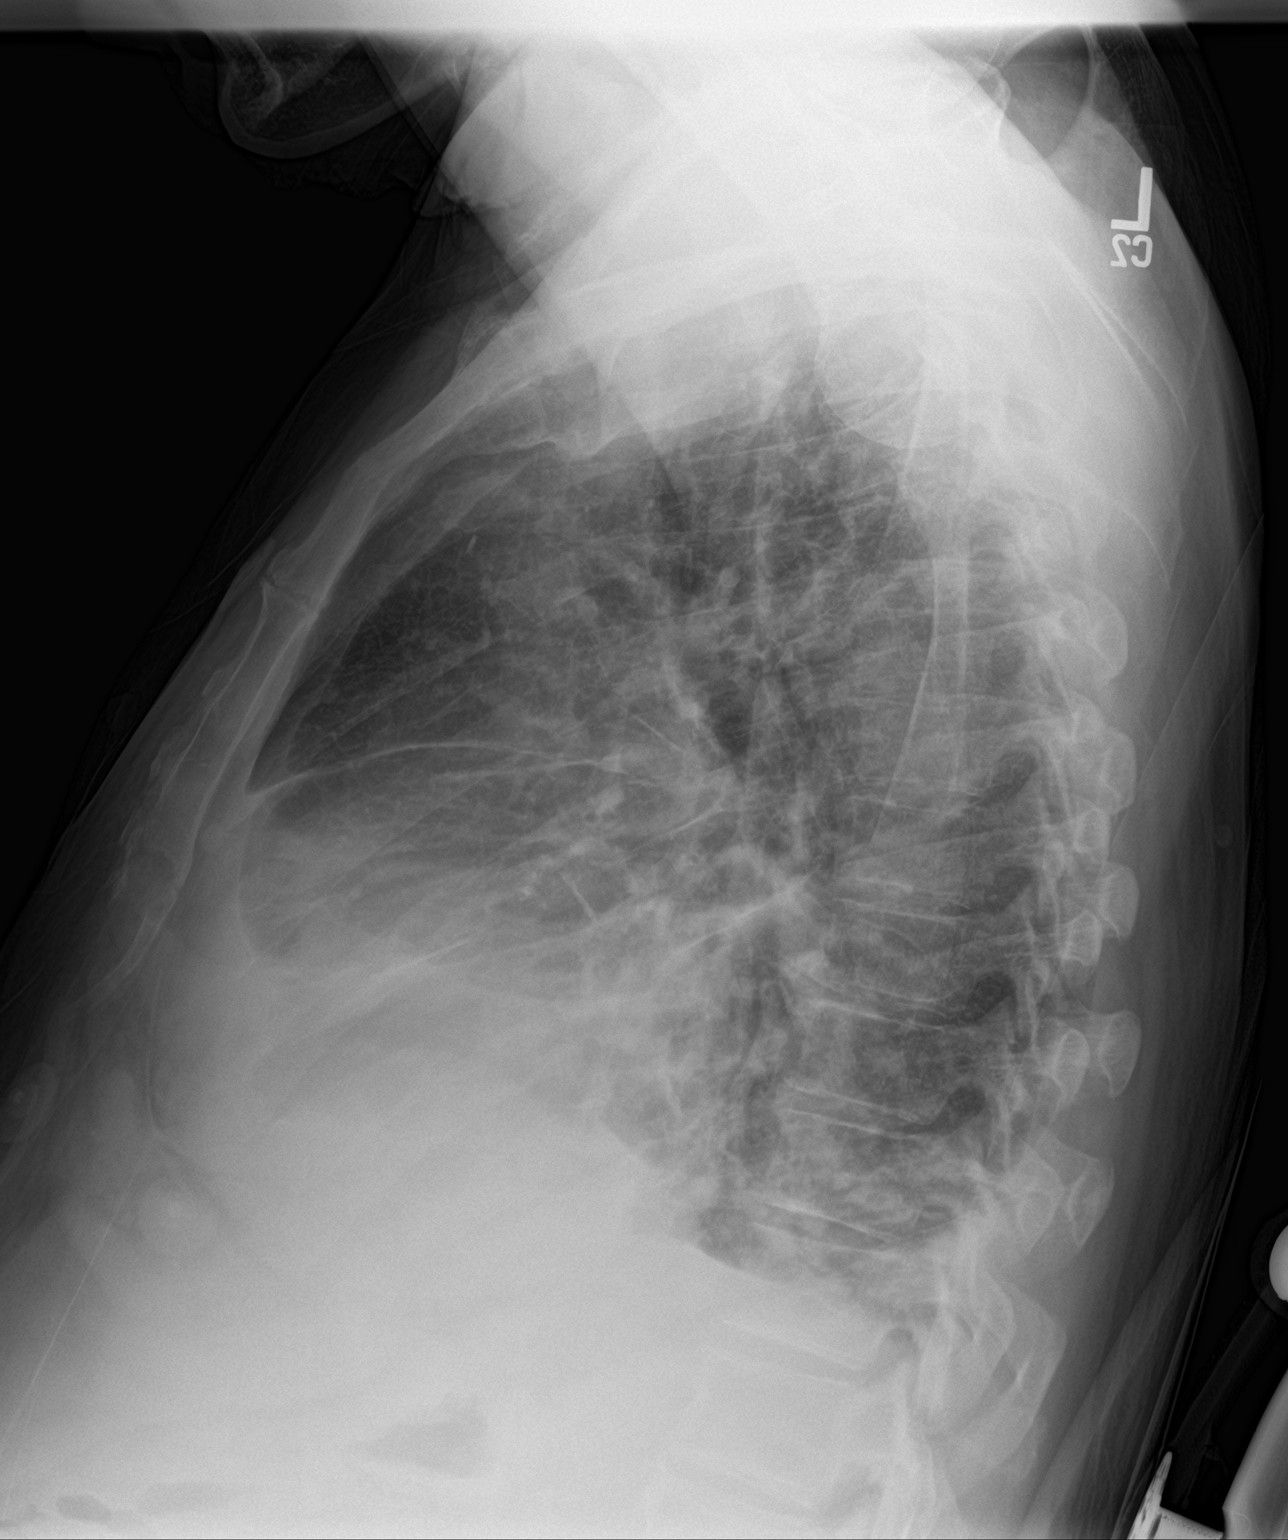

[chest ap]
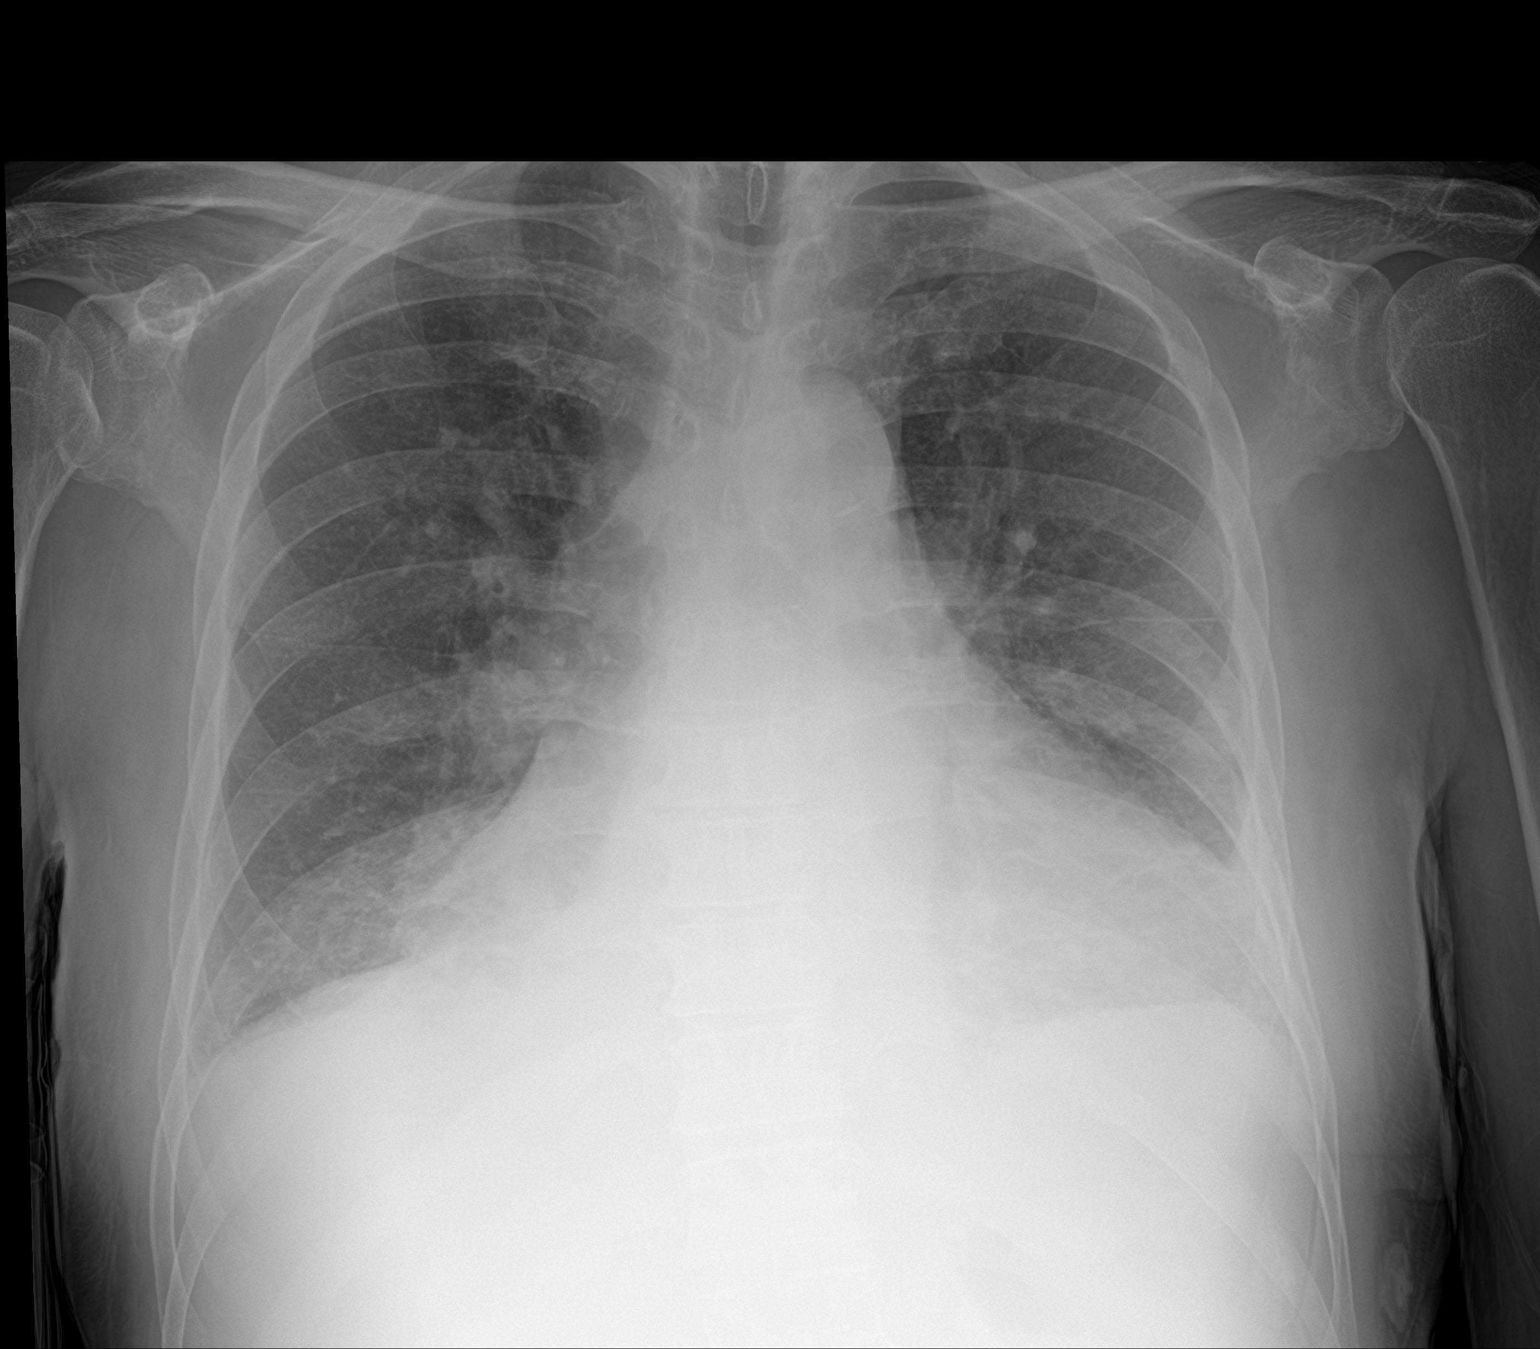

[2 of 2 positions shown; findings below may reference images not displayed]

FINDINGS: Again noted is cardiomegaly. There are small bilateral pleural
effusions. There is evidence of developing pulmonary edema. There
are few scattered airspace opacities bilaterally which are favored
to be secondary to developing pulmonary edema. There is no
pneumothorax. There is no acute osseous abnormality.
IMPRESSION: Overall findings consistent with congestive heart failure with
developing pulmonary edema including small bilateral pleural
effusions.

There are few scattered bilateral airspace opacities which are
favored to represent atelectasis or sequela of pulmonary edema. An
atypical infectious process can have a similar appearance in the
appropriate clinical setting.

## 2020-06-09 ENCOUNTER — Inpatient Hospital Stay (HOSPITAL_COMMUNITY)
Admission: EM | Admit: 2020-06-09 | Discharge: 2020-06-14 | DRG: 641 | Disposition: A | Discharge status: Home / Self Care | Payer: Medicaid Other | Attending: Internal Medicine | Admitting: Internal Medicine

## 2020-06-09 ENCOUNTER — Other Ambulatory Visit: Payer: Self-pay

## 2020-06-09 DIAGNOSIS — M1990 Unspecified osteoarthritis, unspecified site: Secondary | ICD-10-CM | POA: Diagnosis present

## 2020-06-09 DIAGNOSIS — I10 Essential (primary) hypertension: Secondary | ICD-10-CM | POA: Diagnosis present

## 2020-06-09 DIAGNOSIS — Z720 Tobacco use: Secondary | ICD-10-CM | POA: Diagnosis present

## 2020-06-09 DIAGNOSIS — R296 Repeated falls: Secondary | ICD-10-CM | POA: Diagnosis present

## 2020-06-09 DIAGNOSIS — R0789 Other chest pain: Secondary | ICD-10-CM

## 2020-06-09 DIAGNOSIS — Z8546 Personal history of malignant neoplasm of prostate: Secondary | ICD-10-CM

## 2020-06-09 DIAGNOSIS — Z9119 Patient's noncompliance with other medical treatment and regimen: Secondary | ICD-10-CM

## 2020-06-09 DIAGNOSIS — R079 Chest pain, unspecified: Secondary | ICD-10-CM | POA: Diagnosis present

## 2020-06-09 DIAGNOSIS — I252 Old myocardial infarction: Secondary | ICD-10-CM

## 2020-06-09 DIAGNOSIS — F329 Major depressive disorder, single episode, unspecified: Secondary | ICD-10-CM | POA: Diagnosis present

## 2020-06-09 DIAGNOSIS — Z8673 Personal history of transient ischemic attack (TIA), and cerebral infarction without residual deficits: Secondary | ICD-10-CM

## 2020-06-09 DIAGNOSIS — Z888 Allergy status to other drugs, medicaments and biological substances status: Secondary | ICD-10-CM

## 2020-06-09 DIAGNOSIS — I5042 Chronic combined systolic (congestive) and diastolic (congestive) heart failure: Secondary | ICD-10-CM | POA: Diagnosis present

## 2020-06-09 DIAGNOSIS — I493 Ventricular premature depolarization: Secondary | ICD-10-CM | POA: Diagnosis present

## 2020-06-09 DIAGNOSIS — F1020 Alcohol dependence, uncomplicated: Secondary | ICD-10-CM | POA: Diagnosis present

## 2020-06-09 DIAGNOSIS — N183 Chronic kidney disease, stage 3 unspecified: Secondary | ICD-10-CM | POA: Diagnosis present

## 2020-06-09 DIAGNOSIS — E119 Type 2 diabetes mellitus without complications: Secondary | ICD-10-CM

## 2020-06-09 DIAGNOSIS — I130 Hypertensive heart and chronic kidney disease with heart failure and stage 1 through stage 4 chronic kidney disease, or unspecified chronic kidney disease: Secondary | ICD-10-CM | POA: Diagnosis present

## 2020-06-09 DIAGNOSIS — E1122 Type 2 diabetes mellitus with diabetic chronic kidney disease: Secondary | ICD-10-CM | POA: Diagnosis present

## 2020-06-09 DIAGNOSIS — E1169 Type 2 diabetes mellitus with other specified complication: Secondary | ICD-10-CM | POA: Diagnosis present

## 2020-06-09 DIAGNOSIS — E7800 Pure hypercholesterolemia, unspecified: Secondary | ICD-10-CM | POA: Diagnosis present

## 2020-06-09 DIAGNOSIS — E785 Hyperlipidemia, unspecified: Secondary | ICD-10-CM | POA: Diagnosis present

## 2020-06-09 DIAGNOSIS — I2510 Atherosclerotic heart disease of native coronary artery without angina pectoris: Secondary | ICD-10-CM | POA: Diagnosis present

## 2020-06-09 DIAGNOSIS — E871 Hypo-osmolality and hyponatremia: Principal | ICD-10-CM | POA: Diagnosis present

## 2020-06-09 DIAGNOSIS — F1010 Alcohol abuse, uncomplicated: Secondary | ICD-10-CM

## 2020-06-09 DIAGNOSIS — I428 Other cardiomyopathies: Secondary | ICD-10-CM | POA: Diagnosis present

## 2020-06-09 DIAGNOSIS — K219 Gastro-esophageal reflux disease without esophagitis: Secondary | ICD-10-CM | POA: Diagnosis present

## 2020-06-09 DIAGNOSIS — N1831 Chronic kidney disease, stage 3a: Secondary | ICD-10-CM | POA: Diagnosis present

## 2020-06-09 DIAGNOSIS — Z8249 Family history of ischemic heart disease and other diseases of the circulatory system: Secondary | ICD-10-CM

## 2020-06-09 DIAGNOSIS — I4821 Permanent atrial fibrillation: Secondary | ICD-10-CM | POA: Diagnosis present

## 2020-06-09 DIAGNOSIS — F17200 Nicotine dependence, unspecified, uncomplicated: Secondary | ICD-10-CM | POA: Diagnosis present

## 2020-06-09 DIAGNOSIS — K7030 Alcoholic cirrhosis of liver without ascites: Secondary | ICD-10-CM | POA: Diagnosis present

## 2020-06-09 DIAGNOSIS — E1140 Type 2 diabetes mellitus with diabetic neuropathy, unspecified: Secondary | ICD-10-CM | POA: Diagnosis present

## 2020-06-09 DIAGNOSIS — Z79899 Other long term (current) drug therapy: Secondary | ICD-10-CM

## 2020-06-09 DIAGNOSIS — Z20822 Contact with and (suspected) exposure to covid-19: Secondary | ICD-10-CM | POA: Diagnosis present

## 2020-06-09 MED ORDER — SODIUM CHLORIDE 0.9% FLUSH: 3.0000 mL | Freq: Once | INTRAVENOUS | Status: DC

## 2020-06-09 NOTE — ED Triage Notes (Signed)
Pt presents to ED BIB [facility]. Pt c.o central CP nonradiating. Hx a fib and CHF. EMS EKG a fib. EMS VSS. EMS given 324 ASA and nitro x1, no relief. NAD

## 2020-06-10 ENCOUNTER — Encounter (HOSPITAL_COMMUNITY): Payer: Self-pay | Admitting: Internal Medicine

## 2020-06-10 ENCOUNTER — Emergency Department (HOSPITAL_COMMUNITY): Payer: Medicaid Other

## 2020-06-10 DIAGNOSIS — F1020 Alcohol dependence, uncomplicated: Secondary | ICD-10-CM | POA: Diagnosis present

## 2020-06-10 DIAGNOSIS — Z9119 Patient's noncompliance with other medical treatment and regimen: Secondary | ICD-10-CM | POA: Diagnosis not present

## 2020-06-10 DIAGNOSIS — F17200 Nicotine dependence, unspecified, uncomplicated: Secondary | ICD-10-CM | POA: Diagnosis present

## 2020-06-10 DIAGNOSIS — Z8673 Personal history of transient ischemic attack (TIA), and cerebral infarction without residual deficits: Secondary | ICD-10-CM | POA: Diagnosis not present

## 2020-06-10 DIAGNOSIS — E871 Hypo-osmolality and hyponatremia: Secondary | ICD-10-CM | POA: Diagnosis present

## 2020-06-10 DIAGNOSIS — I5042 Chronic combined systolic (congestive) and diastolic (congestive) heart failure: Secondary | ICD-10-CM | POA: Diagnosis present

## 2020-06-10 DIAGNOSIS — I428 Other cardiomyopathies: Secondary | ICD-10-CM | POA: Diagnosis present

## 2020-06-10 DIAGNOSIS — E1140 Type 2 diabetes mellitus with diabetic neuropathy, unspecified: Secondary | ICD-10-CM | POA: Diagnosis present

## 2020-06-10 DIAGNOSIS — K219 Gastro-esophageal reflux disease without esophagitis: Secondary | ICD-10-CM | POA: Diagnosis present

## 2020-06-10 DIAGNOSIS — E1169 Type 2 diabetes mellitus with other specified complication: Secondary | ICD-10-CM | POA: Diagnosis present

## 2020-06-10 DIAGNOSIS — E785 Hyperlipidemia, unspecified: Secondary | ICD-10-CM | POA: Diagnosis present

## 2020-06-10 DIAGNOSIS — M1990 Unspecified osteoarthritis, unspecified site: Secondary | ICD-10-CM | POA: Diagnosis present

## 2020-06-10 DIAGNOSIS — I493 Ventricular premature depolarization: Secondary | ICD-10-CM | POA: Diagnosis present

## 2020-06-10 DIAGNOSIS — Z20822 Contact with and (suspected) exposure to covid-19: Secondary | ICD-10-CM | POA: Diagnosis present

## 2020-06-10 DIAGNOSIS — I252 Old myocardial infarction: Secondary | ICD-10-CM | POA: Diagnosis not present

## 2020-06-10 DIAGNOSIS — E1122 Type 2 diabetes mellitus with diabetic chronic kidney disease: Secondary | ICD-10-CM | POA: Diagnosis present

## 2020-06-10 DIAGNOSIS — R296 Repeated falls: Secondary | ICD-10-CM | POA: Diagnosis present

## 2020-06-10 DIAGNOSIS — I4821 Permanent atrial fibrillation: Secondary | ICD-10-CM | POA: Diagnosis present

## 2020-06-10 DIAGNOSIS — F329 Major depressive disorder, single episode, unspecified: Secondary | ICD-10-CM | POA: Diagnosis present

## 2020-06-10 DIAGNOSIS — E7800 Pure hypercholesterolemia, unspecified: Secondary | ICD-10-CM | POA: Diagnosis present

## 2020-06-10 DIAGNOSIS — K7030 Alcoholic cirrhosis of liver without ascites: Secondary | ICD-10-CM | POA: Diagnosis present

## 2020-06-10 DIAGNOSIS — I2510 Atherosclerotic heart disease of native coronary artery without angina pectoris: Secondary | ICD-10-CM | POA: Diagnosis present

## 2020-06-10 DIAGNOSIS — N1831 Chronic kidney disease, stage 3a: Secondary | ICD-10-CM | POA: Diagnosis present

## 2020-06-10 DIAGNOSIS — I130 Hypertensive heart and chronic kidney disease with heart failure and stage 1 through stage 4 chronic kidney disease, or unspecified chronic kidney disease: Secondary | ICD-10-CM | POA: Diagnosis present

## 2020-06-10 LAB — BASIC METABOLIC PANEL
Anion gap: 13 (ref 5–15)
BUN: 17 mg/dL (ref 6–20)
CO2: 23 mmol/L (ref 22–32)
Calcium: 8.7 mg/dL — ABNORMAL LOW (ref 8.9–10.3)
Chloride: 84 mmol/L — ABNORMAL LOW (ref 98–111)
Creatinine, Ser: 1.45 mg/dL — ABNORMAL HIGH (ref 0.61–1.24)
GFR calc Af Amer: >60 mL/min (ref >60)
GFR calc non Af Amer: 52 mL/min — ABNORMAL LOW (ref >60)
Glucose, Bld: 119 mg/dL — ABNORMAL HIGH (ref 70–99)
Potassium: 4.2 mmol/L (ref 3.5–5.1)
Sodium: 120 mmol/L — ABNORMAL LOW (ref 135–145)

## 2020-06-10 LAB — TROPONIN I (HIGH SENSITIVITY)
Troponin I (High Sensitivity): 32 ng/L — ABNORMAL HIGH (ref <18)
Troponin I (High Sensitivity): 33 ng/L — ABNORMAL HIGH (ref <18)

## 2020-06-10 LAB — RAPID URINE DRUG SCREEN, HOSP PERFORMED
Amphetamines: NOT DETECTED
Barbiturates: NOT DETECTED
Benzodiazepines: NOT DETECTED
Cocaine: NOT DETECTED
Opiates: NOT DETECTED
Tetrahydrocannabinol: NOT DETECTED

## 2020-06-10 LAB — CBC
HCT: 35.5 % — ABNORMAL LOW (ref 39.0–52.0)
Hemoglobin: 12.2 g/dL — ABNORMAL LOW (ref 13.0–17.0)
MCH: 30.3 pg (ref 26.0–34.0)
MCHC: 34.4 g/dL (ref 30.0–36.0)
MCV: 88.3 fL (ref 80.0–100.0)
Platelets: 278 10*3/uL (ref 150–400)
RBC: 4.02 MIL/uL — ABNORMAL LOW (ref 4.22–5.81)
RDW: 13.2 % (ref 11.5–15.5)
WBC: 4 10*3/uL (ref 4.0–10.5)
nRBC: 0 % (ref 0.0–0.2)

## 2020-06-10 LAB — CBG MONITORING, ED: Glucose-Capillary: 148 mg/dL — ABNORMAL HIGH (ref 70–99)

## 2020-06-10 LAB — SARS CORONAVIRUS 2 BY RT PCR (HOSPITAL ORDER, PERFORMED IN ~~LOC~~ HOSPITAL LAB): SARS Coronavirus 2: NEGATIVE

## 2020-06-10 LAB — GLUCOSE, CAPILLARY
Glucose-Capillary: 89 mg/dL (ref 70–99)
Glucose-Capillary: 147 mg/dL — ABNORMAL HIGH (ref 70–99)

## 2020-06-10 MED ORDER — NICOTINE 14 MG/24HR TD PT24
14.0000 mg | MEDICATED_PATCH | Freq: Every day | TRANSDERMAL | Status: DC
  Administered 2020-06-10 – 2020-06-14 (×5): 14 mg via TRANSDERMAL
  Filled 2020-06-10 (×5): qty 1

## 2020-06-10 MED ORDER — ENOXAPARIN SODIUM 40 MG/0.4ML ~~LOC~~ SOLN
40.0000 mg | SUBCUTANEOUS | Status: DC
  Administered 2020-06-10 – 2020-06-14 (×5): 40 mg via SUBCUTANEOUS
  Filled 2020-06-10 (×5): qty 0.4

## 2020-06-10 MED ORDER — TRAMADOL HCL 50 MG PO TABS
50.0000 mg | ORAL_TABLET | Freq: Four times a day (QID) | ORAL | Status: DC | PRN
  Administered 2020-06-10 – 2020-06-11 (×3): 50 mg via ORAL
  Filled 2020-06-10 (×3): qty 1

## 2020-06-10 MED ORDER — MEXILETINE HCL 200 MG PO CAPS
200.0000 mg | ORAL_CAPSULE | Freq: Two times a day (BID) | ORAL | Status: DC
  Administered 2020-06-10 – 2020-06-14 (×9): 200 mg via ORAL
  Filled 2020-06-10 (×11): qty 1

## 2020-06-10 MED ORDER — HYDROCODONE-ACETAMINOPHEN 5-325 MG PO TABS
1.0000 | ORAL_TABLET | Freq: Once | ORAL | Status: AC
  Administered 2020-06-10: 1 via ORAL
  Filled 2020-06-10: qty 1

## 2020-06-10 MED ORDER — ACETAMINOPHEN 650 MG RE SUPP
650.0000 mg | Freq: Four times a day (QID) | RECTAL | Status: DC | PRN
  Filled 2020-06-10: qty 1

## 2020-06-10 MED ORDER — GABAPENTIN 300 MG PO CAPS
600.0000 mg | ORAL_CAPSULE | Freq: Every day | ORAL | Status: DC
  Administered 2020-06-10 – 2020-06-13 (×4): 600 mg via ORAL
  Filled 2020-06-10 (×4): qty 2

## 2020-06-10 MED ORDER — INSULIN ASPART 100 UNIT/ML ~~LOC~~ SOLN: 0.0000 [IU] | Freq: Every day | SUBCUTANEOUS | Status: DC

## 2020-06-10 MED ORDER — LORAZEPAM 2 MG/ML IJ SOLN: 0.0000 mg | Freq: Two times a day (BID) | INTRAMUSCULAR | Status: DC

## 2020-06-10 MED ORDER — MORPHINE SULFATE (PF) 2 MG/ML IV SOLN
1.0000 mg | Freq: Once | INTRAVENOUS | Status: AC
  Administered 2020-06-10: 1 mg via INTRAVENOUS
  Filled 2020-06-10: qty 1

## 2020-06-10 MED ORDER — THIAMINE HCL 100 MG/ML IJ SOLN: 100.0000 mg | Freq: Every day | INTRAMUSCULAR | Status: DC

## 2020-06-10 MED ORDER — LORAZEPAM 1 MG PO TABS: 1.0000 mg | ORAL_TABLET | ORAL | Status: AC | PRN

## 2020-06-10 MED ORDER — LORAZEPAM 1 MG PO TABS: 0.0000 mg | ORAL_TABLET | Freq: Four times a day (QID) | ORAL | Status: DC

## 2020-06-10 MED ORDER — SODIUM CHLORIDE 0.9% FLUSH
3.0000 mL | Freq: Two times a day (BID) | INTRAVENOUS | Status: DC
  Administered 2020-06-10 – 2020-06-14 (×9): 3 mL via INTRAVENOUS

## 2020-06-10 MED ORDER — THIAMINE HCL 100 MG PO TABS
100.0000 mg | ORAL_TABLET | Freq: Every day | ORAL | Status: DC
  Administered 2020-06-10: 100 mg via ORAL
  Filled 2020-06-10: qty 1

## 2020-06-10 MED ORDER — ATORVASTATIN CALCIUM 10 MG PO TABS
20.0000 mg | ORAL_TABLET | Freq: Every day | ORAL | Status: DC
  Administered 2020-06-10 – 2020-06-13 (×4): 20 mg via ORAL
  Filled 2020-06-10 (×4): qty 2

## 2020-06-10 MED ORDER — LORAZEPAM 1 MG PO TABS: 0.0000 mg | ORAL_TABLET | Freq: Two times a day (BID) | ORAL | Status: DC

## 2020-06-10 MED ORDER — LORAZEPAM 2 MG/ML IJ SOLN: 0.0000 mg | Freq: Four times a day (QID) | INTRAMUSCULAR | Status: DC

## 2020-06-10 MED ORDER — ADULT MULTIVITAMIN W/MINERALS CH
1.0000 | ORAL_TABLET | Freq: Every day | ORAL | Status: DC
  Administered 2020-06-10 – 2020-06-14 (×5): 1 via ORAL
  Filled 2020-06-10 (×5): qty 1

## 2020-06-10 MED ORDER — ISOSORB DINITRATE-HYDRALAZINE 20-37.5 MG PO TABS
0.5000 | ORAL_TABLET | Freq: Three times a day (TID) | ORAL | Status: DC
  Administered 2020-06-10 – 2020-06-11 (×5): 0.5 via ORAL
  Filled 2020-06-10 (×2): qty 1
  Filled 2020-06-10: qty 0.5
  Filled 2020-06-10: qty 1
  Filled 2020-06-10 (×2): qty 0.5

## 2020-06-10 MED ORDER — SPIRONOLACTONE 25 MG PO TABS
25.0000 mg | ORAL_TABLET | Freq: Every day | ORAL | Status: DC
  Administered 2020-06-10 – 2020-06-11 (×2): 25 mg via ORAL
  Filled 2020-06-10 (×3): qty 1

## 2020-06-10 MED ORDER — LORAZEPAM 2 MG/ML IJ SOLN
1.0000 mg | INTRAMUSCULAR | Status: AC | PRN
  Administered 2020-06-11 – 2020-06-13 (×4): 1 mg via INTRAVENOUS
  Filled 2020-06-10 (×4): qty 1

## 2020-06-10 MED ORDER — METHOCARBAMOL 1000 MG/10ML IJ SOLN
500.0000 mg | Freq: Four times a day (QID) | INTRAVENOUS | Status: DC | PRN
  Filled 2020-06-10: qty 5

## 2020-06-10 MED ORDER — ACETAMINOPHEN 325 MG PO TABS
650.0000 mg | ORAL_TABLET | Freq: Four times a day (QID) | ORAL | Status: DC | PRN
  Administered 2020-06-12: 650 mg via ORAL
  Filled 2020-06-10 (×2): qty 2

## 2020-06-10 MED ORDER — INSULIN ASPART 100 UNIT/ML ~~LOC~~ SOLN
0.0000 [IU] | Freq: Three times a day (TID) | SUBCUTANEOUS | Status: DC
  Administered 2020-06-10: 2 [IU] via SUBCUTANEOUS
  Administered 2020-06-11: 5 [IU] via SUBCUTANEOUS
  Administered 2020-06-11 – 2020-06-12 (×2): 2 [IU] via SUBCUTANEOUS
  Administered 2020-06-13: 3 [IU] via SUBCUTANEOUS
  Administered 2020-06-14: 2 [IU] via SUBCUTANEOUS

## 2020-06-10 MED ORDER — THIAMINE HCL 100 MG PO TABS
100.0000 mg | ORAL_TABLET | Freq: Every day | ORAL | Status: DC
  Administered 2020-06-11 – 2020-06-14 (×4): 100 mg via ORAL
  Filled 2020-06-10 (×4): qty 1

## 2020-06-10 MED ORDER — ONDANSETRON HCL 4 MG/2ML IJ SOLN
4.0000 mg | Freq: Four times a day (QID) | INTRAMUSCULAR | Status: DC | PRN
  Administered 2020-06-11: 4 mg via INTRAVENOUS
  Filled 2020-06-10: qty 2

## 2020-06-10 MED ORDER — ONDANSETRON HCL 4 MG PO TABS: 4.0000 mg | ORAL_TABLET | Freq: Four times a day (QID) | ORAL | Status: DC | PRN

## 2020-06-10 MED ORDER — ASPIRIN EC 81 MG PO TBEC
81.0000 mg | DELAYED_RELEASE_TABLET | Freq: Every day | ORAL | Status: DC
  Administered 2020-06-10 – 2020-06-14 (×5): 81 mg via ORAL
  Filled 2020-06-10 (×6): qty 1

## 2020-06-10 MED ORDER — FOLIC ACID 1 MG PO TABS
1.0000 mg | ORAL_TABLET | Freq: Every day | ORAL | Status: DC
  Administered 2020-06-10 – 2020-06-14 (×5): 1 mg via ORAL
  Filled 2020-06-10 (×5): qty 1
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
## 2020-06-11 LAB — CBC
HCT: 33.3 % — ABNORMAL LOW (ref 39.0–52.0)
Hemoglobin: 11.2 g/dL — ABNORMAL LOW (ref 13.0–17.0)
MCH: 29.9 pg (ref 26.0–34.0)
MCHC: 33.6 g/dL (ref 30.0–36.0)
MCV: 89 fL (ref 80.0–100.0)
Platelets: 218 10*3/uL (ref 150–400)
RBC: 3.74 MIL/uL — ABNORMAL LOW (ref 4.22–5.81)
RDW: 13.4 % (ref 11.5–15.5)
WBC: 3.3 10*3/uL — ABNORMAL LOW (ref 4.0–10.5)
nRBC: 0 % (ref 0.0–0.2)

## 2020-06-11 LAB — BASIC METABOLIC PANEL
Anion gap: 11 (ref 5–15)
Anion gap: 10 (ref 5–15)
BUN: 16 mg/dL (ref 6–20)
BUN: 18 mg/dL (ref 6–20)
CO2: 21 mmol/L — ABNORMAL LOW (ref 22–32)
CO2: 25 mmol/L (ref 22–32)
Calcium: 8.7 mg/dL — ABNORMAL LOW (ref 8.9–10.3)
Calcium: 9 mg/dL (ref 8.9–10.3)
Chloride: 89 mmol/L — ABNORMAL LOW (ref 98–111)
Chloride: 88 mmol/L — ABNORMAL LOW (ref 98–111)
Creatinine, Ser: 1.45 mg/dL — ABNORMAL HIGH (ref 0.61–1.24)
Creatinine, Ser: 1.5 mg/dL — ABNORMAL HIGH (ref 0.61–1.24)
GFR calc Af Amer: >60 mL/min (ref >60)
GFR calc Af Amer: 58 mL/min — ABNORMAL LOW (ref >60)
GFR calc non Af Amer: 52 mL/min — ABNORMAL LOW (ref >60)
GFR calc non Af Amer: 50 mL/min — ABNORMAL LOW (ref >60)
Glucose, Bld: 115 mg/dL — ABNORMAL HIGH (ref 70–99)
Glucose, Bld: 117 mg/dL — ABNORMAL HIGH (ref 70–99)
Potassium: 4 mmol/L (ref 3.5–5.1)
Potassium: 4.4 mmol/L (ref 3.5–5.1)
Sodium: 121 mmol/L — ABNORMAL LOW (ref 135–145)
Sodium: 123 mmol/L — ABNORMAL LOW (ref 135–145)

## 2020-06-11 LAB — GLUCOSE, CAPILLARY
Glucose-Capillary: 129 mg/dL — ABNORMAL HIGH (ref 70–99)
Glucose-Capillary: 224 mg/dL — ABNORMAL HIGH (ref 70–99)
Glucose-Capillary: 126 mg/dL — ABNORMAL HIGH (ref 70–99)
Glucose-Capillary: 126 mg/dL — ABNORMAL HIGH (ref 70–99)

## 2020-06-11 LAB — SODIUM: Sodium: 122 mmol/L — ABNORMAL LOW (ref 135–145)

## 2020-06-11 LAB — SODIUM, URINE, RANDOM: Sodium, Ur: <10 mmol/L

## 2020-06-11 LAB — OSMOLALITY: Osmolality: 262 mOsm/kg — ABNORMAL LOW (ref 275–295)

## 2020-06-11 LAB — OSMOLALITY, URINE: Osmolality, Ur: 251 mOsm/kg — ABNORMAL LOW (ref 300–900)

## 2020-06-11 MED ORDER — DULOXETINE HCL 60 MG PO CPEP
60.0000 mg | ORAL_CAPSULE | Freq: Every day | ORAL | Status: DC
  Administered 2020-06-11 – 2020-06-12 (×2): 60 mg via ORAL
  Filled 2020-06-11 (×2): qty 1

## 2020-06-11 MED ORDER — SPIRONOLACTONE 12.5 MG HALF TABLET
12.5000 mg | ORAL_TABLET | Freq: Every day | ORAL | Status: DC
  Administered 2020-06-12 – 2020-06-14 (×3): 12.5 mg via ORAL
  Filled 2020-06-11 (×3): qty 1

## 2020-06-11 MED ORDER — GUAIFENESIN 100 MG/5ML PO SOLN
5.0000 mL | ORAL | Status: DC | PRN
  Administered 2020-06-11 – 2020-06-13 (×3): 100 mg via ORAL
  Filled 2020-06-11 (×3): qty 5

## 2020-06-11 MED ORDER — ISOSORB DINITRATE-HYDRALAZINE 20-37.5 MG PO TABS
0.5000 | ORAL_TABLET | Freq: Three times a day (TID) | ORAL | Status: DC
  Administered 2020-06-12 – 2020-06-14 (×7): 0.5 via ORAL
  Filled 2020-06-11 (×7): qty 1

## 2020-06-11 MED ORDER — SODIUM CHLORIDE 1 G PO TABS
1.0000 g | ORAL_TABLET | Freq: Three times a day (TID) | ORAL | Status: DC
  Administered 2020-06-11: 1 g via ORAL
  Filled 2020-06-11 (×4): qty 1

## 2020-06-11 MED ORDER — LIDOCAINE 5 % EX PTCH
1.0000 | MEDICATED_PATCH | CUTANEOUS | Status: DC
  Administered 2020-06-11 – 2020-06-14 (×3): 1 via TRANSDERMAL
  Filled 2020-06-11 (×4): qty 1

## 2020-06-11 MED ORDER — TORSEMIDE 20 MG PO TABS
60.0000 mg | ORAL_TABLET | Freq: Every day | ORAL | Status: DC
  Administered 2020-06-11: 60 mg via ORAL

## 2020-06-11 MED ORDER — TORSEMIDE 20 MG PO TABS
10.0000 mg | ORAL_TABLET | Freq: Every day | ORAL | Status: DC
  Administered 2020-06-12 – 2020-06-14 (×3): 10 mg via ORAL
  Filled 2020-06-11 (×4): qty 1

## 2020-06-11 MED ORDER — TRAMADOL HCL 50 MG PO TABS
50.0000 mg | ORAL_TABLET | ORAL | Status: AC | PRN
  Administered 2020-06-11 (×4): 50 mg via ORAL
  Filled 2020-06-11 (×4): qty 1

## 2020-06-11 NOTE — Progress Notes (Signed)
Progress Note    Brad [name]  UKG:[id number] DOB: 1961/08/29  DOA: 06/09/2020 PCP: Charlott Rakes, MD    Brief Narrative:    Medical records reviewed and are as summarized below:  Brad [name] is an 59 y.o. male past medical history that includes EtOH abuse, A. fib, CHF, hypertension, recurrent acute on chronic hyponatremia with frequent admissions for same admitted June 20 with a sodium of 120.  His complaint was chest pressure which is chronic according to chart review.  He appeared euvolemic and asymptomatic with regards to the hyponatremia.  Of note HPI indicates he reported drinking 1 beer on the day of admission and today he states he has not had beer for 3 days.  He is provided with fluid restriction of 1 L/day to be adjusted based on improvement in hyponatremia  Assessment/Plan:   Principal Problem:   Chronic hyponatremia Active Problems:   Essential hypertension   Permanent atrial fibrillation   Chronic combined systolic and diastolic CHF (congestive heart failure) (HCC)   DM type 2 (diabetes mellitus, type 2) (HCC)   CKD (chronic kidney disease) stage 3, GFR 30-59 ml/min   Alcohol abuse   Tobacco abuse   Chest pain   #1.  Acute on chronic hyponatremia.  Likely related to beer Potomania in the setting of chronic CHF and chronic diuretic use.  On admission his sodium level was 120 and today is 121.  Remains euvolemic.  Home medications include Cymbalta, Aldactone and Demadex.  Cymbalta and Demadex held on admission.  Of note his last admission which was 1 week ago he required 3 doses of tolvaptan. He remains asymptomatic.  Chart review also indicates his sodium level range at baseline is 1 17-130.  Urine output since admission 650 cc. -continue to hold cymbalta and demadex -fluid restriction to 300cc/day -consider Tolvaptan -monitor intake and output -serial bmets  #2.  Chest pain/chronic systolic HF/nonischemic cardiomyopathy.  This is chronic. Pain Located  substernal reproducible.  Chest x-ray with no acute abnormality.  Troponin slightly elevated which is chronic but flat.  No changes in his EKG. Recent echo revealed EF 35-40%.  of note admitted last week with acute on chronic systolic heart failure/nonischemic cardiomyopathy he underwent right heart cath revealing mildly elevated filling pressures and mildly reduced cardiac output.  He was diuresed with IV Lasix and discharged 5 days ago euvolemic.  His home medications changed with an increase in torsemide, as well as a discontinuation of amiodarone and dig per cardiology and bidil and mexiletine started.  Evaluated by cardiology that admission who opined cardiomyopathy likely due to PVCs in the setting of EtOH intake.  Patient getting tramadol and requesting opioids. -Continue aspirin -Continue bidil -monitor on tele  #3.  Chronic kidney disease stage III.  Creatinine 1.45.  This appears to be close to his baseline and also lower than it was at discharge 6 days ago -Monitor -Hold nephrotoxins as able -Intake and output  #4.  Chronic A. fib. Rate controlled.  Last admission amiodarone and digoxin discontinued and Mexiletine started.  HPI indicates patient has not "picked up this medication" yet.  Not on anticoagulation due to frequent falls -Resume mexiletine -asa -monitor  #5.  Diabetes type 2.  Diet controlled.  Recent hemoglobin A1c 6.9.  Serum glucose 115 -Monitor -Carb modified diet -Fluid restriction as noted above  #6.  EtOH dependence with cirrhosis.  Chart review indicates cirrhosis due to hep C and EtOH.  HPI indicates he had one beer that day (  I.e. yesterday) and was cutting back to avoid withdrawals.  Today he states he has not had a beer in 3 days.  Reports having a sponsor and being a member of [employer]. urine drug screen negative -Monitor for withdrawal -CIWA protocol  #7.  Hypertension.  Fair control.  Medications as noted above -Monitor  #8.  Hyperlipidemia.  Chart review  indicates lipid panel done late April of this year -Continue Lipitor  #9.  Tobacco use. -cessation counseling -patch   Family Communication/Anticipated D/C date and plan/Code Status   DVT prophylaxis: Lovenox ordered. Code Status: Full Code.  Family Communication: patient  Disposition Plan: Status is: Inpatient  Remains inpatient appropriate because:Inpatient level of care appropriate due to severity of illness   Dispo: The patient is from: Home              Anticipated d/c is to: Home              Anticipated d/c date is: 2 days              Patient currently is not medically stable to d/c.         Medical Consultants:    None.   Anti-Infectives:    None  Subjective:   Requesting opiod.   Objective:    Vitals:   06/10/20 1530 06/10/20 1618 06/10/20 2322 06/11/20 0559  BP: (!) 135/99 (!) 140/105 99/64 123/76  Pulse: 69 77 70 76  Resp: 17 18 16 16   Temp: 97.9 F (36.6 C) 97.9 F (36.6 C) 98.1 F (36.7 C) 97.9 F (36.6 C)  TempSrc: Oral Oral Oral Oral  SpO2: 96% 100% 99% 98%    Intake/Output Summary (Last 24 hours) at 06/11/2020 0855 Last data filed at 06/11/2020 0304 Gross per 24 hour  Intake 240 ml  Output 650 ml  Net -410 ml   There were no vitals filed for this visit.  Exam: General: Awake alert cooperative well-nourished poor dentition CV: Irregularly irregular no murmur gallop or rub, no lower extremity edema.  Chest tender to palpation Respiratory: No increased work of breathing breath sounds with good air movement I hear no wheeze no crackles Abdomen: Nondistended positive bowel sounds throughout soft nontender to palpation no guarding or rebounding Musculoskeletal: Joints without swelling/erythema full range of motion Neuro: Alert and oriented x3 speech clear facial symmetry moves all extremities spontaneously follows commands Psych: Calm cooperative, poor insight  Data Reviewed:   I have personally reviewed following labs and  imaging studies:  Labs: Labs show the following:   Basic Metabolic Panel: Recent Labs  Lab 06/04/20 0902 06/05/20 0326 06/05/20 0326 06/10/20 0006 06/11/20 0700  NA  --  133*  --  120* 121*  K  --  4.7   < > 4.2 4.0  CL  --  98  --  84* 89*  CO2  --  26  --  23 21*  GLUCOSE  --  121*  --  119* 115*  BUN  --  28*  --  17 16  CREATININE 1.68* 1.56*  --  1.45* 1.45*  CALCIUM  --  8.9  --  8.7* 8.7*  MG  --  1.9  --   --   --    < > = values in this interval not displayed.   GFR Estimated Creatinine Clearance: 57.7 mL/min (A) (by C-G formula based on SCr of 1.45 mg/dL (H)). Liver Function Tests: No results for input(s): AST, ALT, ALKPHOS, BILITOT, PROT, ALBUMIN in  the last 168 hours. No results for input(s): LIPASE, AMYLASE in the last 168 hours. No results for input(s): AMMONIA in the last 168 hours. Coagulation profile No results for input(s): INR, PROTIME in the last 168 hours.  CBC: Recent Labs  Lab 06/04/20 0902 06/05/20 0326 06/10/20 0006 06/11/20 0700  WBC 3.7* 4.0 4.0 3.3*  HGB 10.6* 10.3* 12.2* 11.2*  HCT 32.2* 31.6* 35.5* 33.3*  MCV 94.2 93.5 88.3 89.0  PLT 163 159 278 218   Cardiac Enzymes: No results for input(s): CKTOTAL, CKMB, CKMBINDEX, TROPONINI in the last 168 hours. BNP (last 3 results) No results for input(s): PROBNP in the last 8760 hours. CBG: Recent Labs  Lab 06/05/20 1139 06/10/20 1220 06/10/20 1616 06/10/20 2100 06/11/20 0601  GLUCAP 150* 148* 89 147* 129*   D-Dimer: No results for input(s): DDIMER in the last 72 hours. Hgb A1c: No results for input(s): HGBA1C in the last 72 hours. Lipid Profile: No results for input(s): CHOL, HDL, LDLCALC, TRIG, CHOLHDL, LDLDIRECT in the last 72 hours. Thyroid function studies: No results for input(s): TSH, T4TOTAL, T3FREE, THYROIDAB in the last 72 hours.  Invalid input(s): FREET3 Anemia work up: No results for input(s): VITAMINB12, FOLATE, FERRITIN, TIBC, IRON, RETICCTPCT in the last 72  hours. Sepsis Labs: Recent Labs  Lab 06/04/20 0902 06/05/20 0326 06/10/20 0006 06/11/20 0700  WBC 3.7* 4.0 4.0 3.3*    Microbiology Recent Results (from the past 240 hour(s))  SARS Coronavirus 2 by RT PCR (hospital order, performed in [facility] hospital lab) Nasopharyngeal Nasopharyngeal Swab     Status: None   Collection Time: 06/10/20  8:31 AM   Specimen: Nasopharyngeal Swab  Result Value Ref Range Status   SARS Coronavirus 2 NEGATIVE NEGATIVE Final    Comment: (NOTE) SARS-CoV-2 target nucleic acids are NOT DETECTED.  The SARS-CoV-2 RNA is generally detectable in upper and lower respiratory specimens during the acute phase of infection. The lowest concentration of SARS-CoV-2 viral copies this assay can detect is 250 copies / mL. A negative result does not preclude SARS-CoV-2 infection and should not be used as the sole basis for treatment or other patient management decisions.  A negative result may occur with improper specimen collection / handling, submission of specimen other than nasopharyngeal swab, presence of viral mutation(s) within the areas targeted by this assay, and inadequate number of viral copies (<250 copies / mL). A negative result must be combined with clinical observations, patient history, and epidemiological information.  Fact Sheet for Patients:   StrictlyIdeas.no  Fact Sheet for Healthcare Providers: BankingDealers.co.za  This test is not yet approved or  cleared by the [place] FDA and has been authorized for detection and/or diagnosis of SARS-CoV-2 by FDA under an Emergency Use Authorization (EUA).  This EUA will remain in effect (meaning this test can be used) for the duration of the COVID-19 declaration under Section 564(b)(1) of the Act, 21 U.S.C. section 360bbb-3(b)(1), unless the authorization is terminated or revoked sooner.  Performed at [facility], [place] [address]  [place], [place] [address]     Procedures and diagnostic studies:  DG Chest 2 View  Result Date: 06/10/2020 CLINICAL DATA:  Central nonradiating chest pain. EXAM: CHEST - 2 VIEW COMPARISON:  Radiograph 05/29/2020. FINDINGS: Chronic cardiomegaly with unchanged mediastinal contours. Aortic atherosclerosis. No significant pleural effusion. Mild vascular congestion without pulmonary edema. No focal airspace disease. No pneumothorax. No acute osseous abnormalities are seen. IMPRESSION: Chronic cardiomegaly with mild vascular congestion. Electronically Signed   By:  Keith Rake M.D.   On: 06/10/2020 00:18    Medications:   . aspirin EC  81 mg Oral Daily  . atorvastatin  20 mg Oral q1800  . enoxaparin (LOVENOX) injection  40 mg Subcutaneous Q24H  . folic acid  1 mg Oral Daily  . gabapentin  600 mg Oral QHS  . insulin aspart  0-15 Units Subcutaneous TID WC  . insulin aspart  0-5 Units Subcutaneous QHS  . isosorbide-hydrALAZINE  0.5 tablet Oral TID  . mexiletine  200 mg Oral Q12H  . multivitamin with minerals  1 tablet Oral Daily  . nicotine  14 mg Transdermal Daily  . sodium chloride flush  3 mL Intravenous Q12H  . spironolactone  25 mg Oral Daily  . thiamine  100 mg Oral Daily   Or  . thiamine  100 mg Intravenous Daily   Continuous Infusions: . methocarbamol (ROBAXIN) IV       LOS: 1 day   Radene Gunning NP  [facility]   How to contact the [facility] Attending or Consulting provider [place] or covering provider during after hours [place], for this patient?  1. Check the care team in [facility] and look for a) attending/consulting [facility] provider listed and b) the [facility] team listed 2. Log into [email or web address] and use 's universal password to access. If you do not have the password, please contact the hospital operator. 3. Locate the [facility] provider you are looking for under [facility] and page to a number that you can be directly reached. 4. If you still have difficulty reaching  the provider, please page the [facility] (Director on Call) for the Hospitalists listed on amion for assistance.  06/11/2020, 8:55 AM

## 2020-06-11 NOTE — Progress Notes (Signed)
Pt not compliant with fluid restriction of 165ml spoke with pt and notified him earlier this AM and took all liquids out of his room, but pt has been drinking full teas and soda with meals. notified him that he is way over his limit. Will notify MD

## 2020-06-11 NOTE — Progress Notes (Signed)
Patient has not been compliant with his fluid restriction.    Ordered serum osmolality, urine osmolality and urine sodium to further classify his hyponatremia.  Most of his medications can induce hyponatremia, therefore we will resume his home medications and add salt tablets 1 g 3 times daily.  We will avoid rapid correction of his hyponatremia to prevent osmotically induced CNS demyelinization and obtain serum sodium every 6 hours.

## 2020-06-12 ENCOUNTER — Telehealth (HOSPITAL_COMMUNITY): Payer: Self-pay | Admitting: Licensed Clinical Social Worker

## 2020-06-12 LAB — GLUCOSE, CAPILLARY
Glucose-Capillary: 129 mg/dL — ABNORMAL HIGH (ref 70–99)
Glucose-Capillary: 118 mg/dL — ABNORMAL HIGH (ref 70–99)
Glucose-Capillary: 118 mg/dL — ABNORMAL HIGH (ref 70–99)
Glucose-Capillary: 190 mg/dL — ABNORMAL HIGH (ref 70–99)

## 2020-06-12 LAB — BASIC METABOLIC PANEL
Anion gap: 11 (ref 5–15)
BUN: 21 mg/dL — ABNORMAL HIGH (ref 6–20)
CO2: 24 mmol/L (ref 22–32)
Calcium: 8.8 mg/dL — ABNORMAL LOW (ref 8.9–10.3)
Chloride: 88 mmol/L — ABNORMAL LOW (ref 98–111)
Creatinine, Ser: 1.58 mg/dL — ABNORMAL HIGH (ref 0.61–1.24)
GFR calc Af Amer: 54 mL/min — ABNORMAL LOW (ref >60)
GFR calc non Af Amer: 47 mL/min — ABNORMAL LOW (ref >60)
Glucose, Bld: 118 mg/dL — ABNORMAL HIGH (ref 70–99)
Potassium: 4.5 mmol/L (ref 3.5–5.1)
Sodium: 123 mmol/L — ABNORMAL LOW (ref 135–145)

## 2020-06-12 LAB — SODIUM
Sodium: 121 mmol/L — ABNORMAL LOW (ref 135–145)
Sodium: 124 mmol/L — ABNORMAL LOW (ref 135–145)
Sodium: 125 mmol/L — ABNORMAL LOW (ref 135–145)

## 2020-06-12 MED ORDER — SODIUM CHLORIDE 1 G PO TABS
2.0000 g | ORAL_TABLET | Freq: Three times a day (TID) | ORAL | Status: DC
  Administered 2020-06-12 – 2020-06-13 (×6): 2 g via ORAL
  Filled 2020-06-12 (×8): qty 2

## 2020-06-12 MED ORDER — ESCITALOPRAM OXALATE 10 MG PO TABS
10.0000 mg | ORAL_TABLET | Freq: Every day | ORAL | Status: DC
  Administered 2020-06-13 – 2020-06-14 (×2): 10 mg via ORAL
  Filled 2020-06-12 (×2): qty 1

## 2020-06-12 MED ORDER — TRAMADOL HCL 50 MG PO TABS
50.0000 mg | ORAL_TABLET | Freq: Four times a day (QID) | ORAL | Status: AC | PRN
  Administered 2020-06-12 – 2020-06-13 (×4): 50 mg via ORAL
  Filled 2020-06-12 (×4): qty 1

## 2020-06-12 NOTE — Progress Notes (Signed)
Duloxetine has the potential to increase the level of his antiarrhythmic mexiletine. D/w Dr. [place] Crane, we will change to Lexapro 10mg  PO qday.  Onnie Boer, PharmD, BCIDP, AAHIVP, CPP Infectious Disease Pharmacist 06/12/2020 2:34 PM

## 2020-06-12 NOTE — Telephone Encounter (Signed)
CSW called [facility] and spoke with special proceedings representative to discuss how to proceed with getting pt birth certificate updated.  They report that pt will have to apply for an adult name change- this will require a copy of his original birth certificate as well as a stat and FBI background check.  CSW updated pt and will continue to try and figure out how to proceed with this process.  Pt also reports his phone was accidentally thrown away with the laundry- inpatient staff attempting to help locate or discuss with patient experience about replacement.  CSW provided pt with my business card with my number as well as community paramedics number so he could reach out when he knows more about his phone situation.  CSW will continue to follow and assist as needed  [name], [facility] Desk#: [phone] Cell#: [phone]

## 2020-06-12 NOTE — Progress Notes (Signed)
Pt explained that his sodium has gone down possibly to his non compliance of fluid restriction. He stated "I knew avout the fluid restriction but aint no body tell me I was already over". I reminded pt that this writer explained that he was over his restriction and that he continued to call and ask for ice and fluid. He then went to his meal tray that had 480cc fluid on it.

## 2020-06-12 NOTE — Progress Notes (Signed)
Pt reminded of fluid restriction; he stated to writer "well the day nurse told me I can still have ice or at least one more cup of ice". Notified pt that he appeared to already be out of compliance on fluids. "Well she said I could have it". Pt continues to request cups of ice and has been reminded of his fluid restrictions.

## 2020-06-12 NOTE — Progress Notes (Signed)
Progress Note    Brad [name]  ZMO:[id number] DOB: Sep 11, 1961  DOA: 06/09/2020 PCP: Charlott Rakes, MD    Brief Narrative:    Medical records reviewed and are as summarized below:  Brad [name] is an 59 y.o. male with a past medical history that includes EtOH abuse, CHF, A. fib, hypertension, recurrent acute on chronic hyponatremia with frequent admissions was admitted June 20 with a sodium of 120.  He also complained of chest pressure which is also chronic according to the chart.  He appeared euvolemic and asymptomatic with regards to the hyponatremia.  Of note the HPI indicates he reported drinking 1 beer on the day of admission and the following day he states he has not had a beer for 3 days.  He was provided with fluid restriction of 1 L a day initially which was decreased to 150 cc/day based on intake and output.  Fluid restriction has not been adhered to by the patient or the nursing staff to date.  Assessment/Plan:   Principal Problem:   Chronic hyponatremia Active Problems:   Essential hypertension   Permanent atrial fibrillation   Chronic combined systolic and diastolic CHF (congestive heart failure) (HCC)   DM type 2 (diabetes mellitus, type 2) (HCC)   CKD (chronic kidney disease) stage 3, GFR 30-59 ml/min   Alcohol abuse   Tobacco abuse   Chest pain  1. Acute on chronic hyponatremia.  Likely related to beer potomania in the setting of chronic CHF and chronic diuretic use.  Slowly trending up.  Having difficulty with fluid restriction between patient/nursing/dietary.  Urine sodium less than 10, urine osmolality 251, serum osmolality 262, serum sodium 123 this morning. Salt tabs added 6/21 -Continue fluid restriction at 1214ml/day -continue salt tabs -serial serum sodium levels  2.  Chest pain/chronic systolic heart failure/nonischemic cardiomyopathy.  His chest pain is chronic per chart review.  Located substernal and reproducible.  Chest x-ray with no acute  abnormality.  No EKG changes.  Recent echo revealed EF of 35%.  Recently evaluated by cardiology who opined cardiomyopathy likely due to PVCs in the setting of EtOH intake. -Continue aspirin -continue spironolactone, torsemide -continue bidil -ultram -monitor intake and output  3.  Chronic kidney disease stage III. Creatinine 1.5.  This appears to be close to his baseline -Hold nephrotoxins -Monitor intake and output  4.  A. fib.  Currently rate controlled.  Last admission amiodarone and dig discontinued and Mexiletine started.  -continue home meds -asa  5.  Diabetes type 2.  Diet controlled.  Recent hemoglobin A1c 6.9 -Carb modified diet -Fluid restriction as noted above  6. ETOH dependance with cirrhosis.  Chart review indicates cirrhosis related to hep C and EtOH.  He reports "cutting back" on his EtOH consumption.  No sign symptoms of withdrawal -CIWA protocol  7. HTN. Only fair controll -bidil  8. Hyperlipidemia -continue lipitor  9. Tobacco use -patch -cessation counseling     Family Communication/Anticipated D/C date and plan/Code Status   DVT prophylaxis: Lovenox ordered. Code Status: Full Code.  Family Communication: patient Disposition Plan: Status is: Inpatient  Remains inpatient appropriate because:Inpatient level of care appropriate due to severity of illness   Dispo:  Patient From: Home  Planned Disposition: Home  Expected discharge date: 06/13/20  Medically stable for discharge: No          Medical Consultants:    None.   Anti-Infectives:    None  Subjective:   Requesting more fluids  Objective:  Vitals:   06/11/20 1400 06/11/20 1842 06/12/20 0030 06/12/20 0554  BP: 105/75 118/74 (!) 129/93 (!) 135/93  Pulse:  68 77 67  Resp:  16 17 18   Temp:  (!) 97.4 F (36.3 C) 97.7 F (36.5 C) 97.9 F (36.6 C)  TempSrc:  Oral Oral Oral  SpO2:  99% 100% 100%    Intake/Output Summary (Last 24 hours) at 06/12/2020 0914 Last  data filed at 06/12/2020 0846 Gross per 24 hour  Intake 1490 ml  Output --  Net 1490 ml   There were no vitals filed for this visit.  Exam: General: Well-nourished no acute distress CV: Irregularly irregular no murmur gallop or rub irregularly irregular no murmur gallop or rub chest tender to palpation no lower extremity edema chest tender to palpation Resp: no increased work of breathing BS clear bialterally Abdomen: soft +BS non-tender to palpation MS: joints without swelling/erythema Neuro: alert oriented x3. Speech clear facial symmetry  Data Reviewed:   I have personally reviewed following labs and imaging studies:  Labs: Labs show the following:   Basic Metabolic Panel: Recent Labs  Lab 06/10/20 0006 06/10/20 0006 06/11/20 0700 06/11/20 0700 06/11/20 1552 06/11/20 1743 06/12/20 0018 06/12/20 0548  NA 120*   < > 121*  --  123* 122* 121* 123*  K 4.2   < > 4.0   < > 4.4  --   --  4.5  CL 84*  --  89*  --  88*  --   --  88*  CO2 23  --  21*  --  25  --   --  24  GLUCOSE 119*  --  115*  --  117*  --   --  118*  BUN 17  --  16  --  18  --   --  21*  CREATININE 1.45*  --  1.45*  --  1.50*  --   --  1.58*  CALCIUM 8.7*  --  8.7*  --  9.0  --   --  8.8*   < > = values in this interval not displayed.   GFR Estimated Creatinine Clearance: 53 mL/min (A) (by C-G formula based on SCr of 1.58 mg/dL (H)). Liver Function Tests: No results for input(s): AST, ALT, ALKPHOS, BILITOT, PROT, ALBUMIN in the last 168 hours. No results for input(s): LIPASE, AMYLASE in the last 168 hours. No results for input(s): AMMONIA in the last 168 hours. Coagulation profile No results for input(s): INR, PROTIME in the last 168 hours.  CBC: Recent Labs  Lab 06/10/20 0006 06/11/20 0700  WBC 4.0 3.3*  HGB 12.2* 11.2*  HCT 35.5* 33.3*  MCV 88.3 89.0  PLT 278 218   Cardiac Enzymes: No results for input(s): CKTOTAL, CKMB, CKMBINDEX, TROPONINI in the last 168 hours. BNP (last 3 results) No  results for input(s): PROBNP in the last 8760 hours. CBG: Recent Labs  Lab 06/11/20 0601 06/11/20 1109 06/11/20 1617 06/11/20 2130 06/12/20 0557  GLUCAP 129* 224* 126* 126* 129*   D-Dimer: No results for input(s): DDIMER in the last 72 hours. Hgb A1c: No results for input(s): HGBA1C in the last 72 hours. Lipid Profile: No results for input(s): CHOL, HDL, LDLCALC, TRIG, CHOLHDL, LDLDIRECT in the last 72 hours. Thyroid function studies: No results for input(s): TSH, T4TOTAL, T3FREE, THYROIDAB in the last 72 hours.  Invalid input(s): FREET3 Anemia work up: No results for input(s): VITAMINB12, FOLATE, FERRITIN, TIBC, IRON, RETICCTPCT in the last 72 hours. Sepsis Labs: Recent  Labs  Lab 06/10/20 0006 06/11/20 0700  WBC 4.0 3.3*    Microbiology Recent Results (from the past 240 hour(s))  SARS Coronavirus 2 by RT PCR (hospital order, performed in [facility] hospital lab) Nasopharyngeal Nasopharyngeal Swab     Status: None   Collection Time: 06/10/20  8:31 AM   Specimen: Nasopharyngeal Swab  Result Value Ref Range Status   SARS Coronavirus 2 NEGATIVE NEGATIVE Final    Comment: (NOTE) SARS-CoV-2 target nucleic acids are NOT DETECTED.  The SARS-CoV-2 RNA is generally detectable in upper and lower respiratory specimens during the acute phase of infection. The lowest concentration of SARS-CoV-2 viral copies this assay can detect is 250 copies / mL. A negative result does not preclude SARS-CoV-2 infection and should not be used as the sole basis for treatment or other patient management decisions.  A negative result may occur with improper specimen collection / handling, submission of specimen other than nasopharyngeal swab, presence of viral mutation(s) within the areas targeted by this assay, and inadequate number of viral copies (<250 copies / mL). A negative result must be combined with clinical observations, patient history, and epidemiological information.  Fact Sheet  for Patients:   StrictlyIdeas.no  Fact Sheet for Healthcare Providers: BankingDealers.co.za  This test is not yet approved or  cleared by the [place] FDA and has been authorized for detection and/or diagnosis of SARS-CoV-2 by FDA under an Emergency Use Authorization (EUA).  This EUA will remain in effect (meaning this test can be used) for the duration of the COVID-19 declaration under Section 564(b)(1) of the Act, 21 U.S.C. section 360bbb-3(b)(1), unless the authorization is terminated or revoked sooner.  Performed at [facility], [place] [address], [place], [place] [address]     Procedures and diagnostic studies:  No results found.  Medications:   . aspirin EC  81 mg Oral Daily  . atorvastatin  20 mg Oral q1800  . DULoxetine  60 mg Oral Daily  . enoxaparin (LOVENOX) injection  40 mg Subcutaneous Q24H  . folic acid  1 mg Oral Daily  . gabapentin  600 mg Oral QHS  . insulin aspart  0-15 Units Subcutaneous TID WC  . insulin aspart  0-5 Units Subcutaneous QHS  . isosorbide-hydrALAZINE  0.5 tablet Oral TID  . lidocaine  1 patch Transdermal Q24H  . mexiletine  200 mg Oral Q12H  . multivitamin with minerals  1 tablet Oral Daily  . nicotine  14 mg Transdermal Daily  . sodium chloride flush  3 mL Intravenous Q12H  . sodium chloride  2 g Oral TID WC  . spironolactone  12.5 mg Oral Daily  . thiamine  100 mg Oral Daily   Or  . thiamine  100 mg Intravenous Daily  . torsemide  10 mg Oral Daily   Continuous Infusions: . methocarbamol (ROBAXIN) IV       LOS: 2 days   Radene Gunning NP [facility]   How to contact the [facility] Attending or Consulting provider [place] or covering provider during after hours [place], for this patient?  1. Check the care team in [facility] and look for a) attending/consulting TR[facility] provider listed and b) the [facility] team listed 2. Log into [email or web address] and use [place]'s universal password to  access. If you do not have the password, please contact the hospital operator. 3. Locate the [facility] provider you are looking for under [facility] and page to a number that you can be directly  reached. 4. If you still have difficulty reaching the provider, please page the [facility] (Director on Call) for the Hospitalists listed on amion for assistance.  06/12/2020, 9:14 AM

## 2020-06-13 LAB — BASIC METABOLIC PANEL
Anion gap: 10 (ref 5–15)
BUN: 23 mg/dL — ABNORMAL HIGH (ref 6–20)
CO2: 24 mmol/L (ref 22–32)
Calcium: 8.8 mg/dL — ABNORMAL LOW (ref 8.9–10.3)
Chloride: 93 mmol/L — ABNORMAL LOW (ref 98–111)
Creatinine, Ser: 1.61 mg/dL — ABNORMAL HIGH (ref 0.61–1.24)
GFR calc Af Amer: 53 mL/min — ABNORMAL LOW (ref >60)
GFR calc non Af Amer: 46 mL/min — ABNORMAL LOW (ref >60)
Glucose, Bld: 135 mg/dL — ABNORMAL HIGH (ref 70–99)
Potassium: 4.8 mmol/L (ref 3.5–5.1)
Sodium: 127 mmol/L — ABNORMAL LOW (ref 135–145)

## 2020-06-13 LAB — SODIUM
Sodium: 124 mmol/L — ABNORMAL LOW (ref 135–145)
Sodium: 128 mmol/L — ABNORMAL LOW (ref 135–145)
Sodium: 129 mmol/L — ABNORMAL LOW (ref 135–145)

## 2020-06-13 LAB — GLUCOSE, CAPILLARY
Glucose-Capillary: 169 mg/dL — ABNORMAL HIGH (ref 70–99)
Glucose-Capillary: 88 mg/dL (ref 70–99)
Glucose-Capillary: 119 mg/dL — ABNORMAL HIGH (ref 70–99)
Glucose-Capillary: 137 mg/dL — ABNORMAL HIGH (ref 70–99)

## 2020-06-13 MED ORDER — LORAZEPAM 2 MG/ML IJ SOLN
1.0000 mg | INTRAMUSCULAR | Status: DC | PRN
  Administered 2020-06-13: 2 mg via INTRAVENOUS
  Administered 2020-06-13: 1 mg via INTRAVENOUS
  Filled 2020-06-13 (×2): qty 1

## 2020-06-13 MED ORDER — TRAMADOL HCL 50 MG PO TABS
50.0000 mg | ORAL_TABLET | Freq: Four times a day (QID) | ORAL | Status: DC | PRN
  Administered 2020-06-13 – 2020-06-14 (×4): 50 mg via ORAL
  Filled 2020-06-13 (×4): qty 1

## 2020-06-13 MED ORDER — LORAZEPAM 1 MG PO TABS: 1.0000 mg | ORAL_TABLET | ORAL | Status: DC | PRN

## 2020-06-13 MED ORDER — LORAZEPAM 2 MG/ML IJ SOLN: 1.0000 mg | INTRAMUSCULAR | Status: DC | PRN

## 2020-06-13 NOTE — Progress Notes (Signed)
PROGRESS NOTE  Brad [name] HWE:[id number] DOB: 07-20-1961 DOA: 06/09/2020 PCP: Charlott Rakes, MD  HPI/Recap of past 24 hours:  Brad [name] is an 59 y.o. male with a past medical history that includes EtOH abuse, CHF, A. fib, hypertension, recurrent acute on chronic hyponatremia with frequent admissions was admitted June 20 with a sodium of 120.  He also complained of chest pressure which is also chronic according to the chart.  He appeared euvolemic and asymptomatic with regards to the hyponatremia.  Of note the HPI indicates he reported drinking 1 beer on the day of admission and the following day he states he has not had a beer for 3 days.  He was provided with fluid restriction of 1 L a day initially which was decreased to 150 cc/day based on intake and output.  Fluid restriction has not been adhered to by the patient or the nursing staff to date.  06/13/20: Seen and examined.  Noncompliant with medical management, refusing to fluid restrict.  Salt tablets 3 times daily, serum sodium trending up.  Assessment/Plan: Principal Problem:   Chronic hyponatremia Active Problems:   Essential hypertension   DM type 2 (diabetes mellitus, type 2) (HCC)   CKD (chronic kidney disease) stage 3, GFR 30-59 ml/min   Permanent atrial fibrillation   Alcohol abuse   Tobacco abuse   Chest pain   Chronic combined systolic and diastolic CHF (congestive heart failure) ([place])  1. Acute on chronic hyponatremia.  Likely related to beer potomania in the setting of chronic CHF and chronic diuretic use.  Slowly trending up.  Having difficulty with fluid restriction between patient/nursing/dietary.  Urine sodium less than 10, urine osmolality 251, serum osmolality 262, serum sodium trending 128 from 123. Salt tabs added 6/21 -Continue fluid restriction at 1248ml/day, has not been compliant. -continue salt tabs -serial serum sodium levels  2.  Chest pain/chronic systolic heart failure/nonischemic  cardiomyopathy.  His chest pain is chronic per chart review.  Located substernal and reproducible.  Chest x-ray with no acute abnormality.  No EKG changes.  Recent echo revealed EF of 35%.  Recently evaluated by cardiology who opined cardiomyopathy likely due to PVCs in the setting of EtOH intake. -Continue aspirin -continue spironolactone, torsemide -continue bidil -ultram -monitor intake and output  3.  Chronic kidney disease stage III. Creatinine 1.5 at baseline.    uptrending 1.6 Continue to avoid nephrotoxins -Continue to monitor intake and output  4.    Paroxysmal A. fib.  Currently rate controlled.  Last admission amiodarone and dig discontinued and Mexiletine started.  -continue home meds -not anticoagulated  5.  Diabetes type 2.  Diet controlled.  Recent hemoglobin A1c 6.9 -Carb modified diet -Fluid restriction as noted above -Insulin sliding scale  6. ETOH dependance with cirrhosis.  Chart review indicates cirrhosis related to hep C and EtOH.  He reports "cutting back" on his EtOH consumption.  No sign symptoms of withdrawal.  Intends to continue to follow with [employer]coh[employer] Anonymous -CIWA protocol  7. HTN.  BP is at goal-continue home regimen.  8. Hyperlipidemia -continue lipitor  9. Tobacco use -patch -cessation counseling     Family Communication/Anticipated D/C date and plan/Code Status   DVT prophylaxis: Lovenox Subcu daily. Code Status: Full Code.  Family Communication: patient Disposition Plan: Status is: Inpatient  Remains inpatient appropriate because:Inpatient level of care appropriate due to severity of illness   Dispo:             Patient From: Home  Planned Disposition: Home             Expected discharge date: 06/14/20             Medically stable for discharge: No     Objective: Vitals:   06/12/20 1233 06/12/20 1804 06/13/20 0010 06/13/20 0534  BP: (!) 120/92 114/71 127/86 118/79  Pulse: 74 63 74 65  Resp: 16  18 18 18   Temp: (!) 97.5 F (36.4 C) 97.9 F (36.6 C) (!) 97.4 F (36.3 C) 97.8 F (36.6 C)  TempSrc: Oral Oral Oral Oral  SpO2: 100% 99% 100% 95%  Weight:    78.3 kg    Intake/Output Summary (Last 24 hours) at 06/13/2020 1432 Last data filed at 06/13/2020 1123 Gross per 24 hour  Intake 1350 ml  Output --  Net 1350 ml   Filed Weights   06/13/20 0534  Weight: 78.3 kg    Exam:  . General: 59 y.o. year-old male well developed well nourished in no acute distress.  Alert and oriented x3. . Cardiovascular: Regular rate and rhythm with no rubs or gallops.  No thyromegaly or JVD noted.   Marland Kitchen Respiratory: Clear to auscultation with no wheezes or rales. Good inspiratory effort. . Abdomen: Soft nontender nondistended with normal bowel sounds x4 quadrants. . Musculoskeletal: No lower extremity edema. 2/4 pulses in all 4 extremities. Marland Kitchen Psychiatry: Mood is appropriate for condition and setting   Data Reviewed: CBC: Recent Labs  Lab 06/10/20 0006 06/11/20 0700  WBC 4.0 3.3*  HGB 12.2* 11.2*  HCT 35.5* 33.3*  MCV 88.3 89.0  PLT 278 497   Basic Metabolic Panel: Recent Labs  Lab 06/10/20 0006 06/10/20 0006 06/11/20 0700 06/11/20 0700 06/11/20 1552 06/11/20 1743 06/12/20 0548 06/12/20 0548 06/12/20 1134 06/12/20 1702 06/12/20 2314 06/13/20 0441 06/13/20 1106  NA 120*   < > 121*   < > 123*   < > 123*   < > 124* 125* 124* 127* 128*  K 4.2  --  4.0  --  4.4  --  4.5  --   --   --   --  4.8  --   CL 84*  --  89*  --  88*  --  88*  --   --   --   --  93*  --   CO2 23  --  21*  --  25  --  24  --   --   --   --  24  --   GLUCOSE 119*  --  115*  --  117*  --  118*  --   --   --   --  135*  --   BUN 17  --  16  --  18  --  21*  --   --   --   --  23*  --   CREATININE 1.45*  --  1.45*  --  1.50*  --  1.58*  --   --   --   --  1.61*  --   CALCIUM 8.7*  --  8.7*  --  9.0  --  8.8*  --   --   --   --  8.8*  --    < > = values in this interval not displayed.   GFR: Estimated  Creatinine Clearance: 52 mL/min (A) (by C-G formula based on SCr of 1.61 mg/dL (H)). Liver Function Tests: No results for input(s): AST, ALT, ALKPHOS, BILITOT, PROT, ALBUMIN in  the last 168 hours. No results for input(s): LIPASE, AMYLASE in the last 168 hours. No results for input(s): AMMONIA in the last 168 hours. Coagulation Profile: No results for input(s): INR, PROTIME in the last 168 hours. Cardiac Enzymes: No results for input(s): CKTOTAL, CKMB, CKMBINDEX, TROPONINI in the last 168 hours. BNP (last 3 results) No results for input(s): PROBNP in the last 8760 hours. HbA1C: No results for input(s): HGBA1C in the last 72 hours. CBG: Recent Labs  Lab 06/12/20 1117 06/12/20 1612 06/12/20 2119 06/13/20 0614 06/13/20 1107  GLUCAP 118* 118* 190* 169* 88   Lipid Profile: No results for input(s): CHOL, HDL, LDLCALC, TRIG, CHOLHDL, LDLDIRECT in the last 72 hours. Thyroid Function Tests: No results for input(s): TSH, T4TOTAL, FREET4, T3FREE, THYROIDAB in the last 72 hours. Anemia Panel: No results for input(s): VITAMINB12, FOLATE, FERRITIN, TIBC, IRON, RETICCTPCT in the last 72 hours. Urine analysis:    Component Value Date/Time   COLORURINE STRAW (A) 03/30/2020 1928   APPEARANCEUR CLEAR 03/30/2020 1928   LABSPEC 1.002 (L) 03/30/2020 1928   PHURINE 6.0 03/30/2020 1928   GLUCOSEU NEGATIVE 03/30/2020 1928   HGBUR NEGATIVE 03/30/2020 1928   BILIRUBINUR NEGATIVE 03/30/2020 1928   BILIRUBINUR neg 05/08/2017 1503   KETONESUR NEGATIVE 03/30/2020 1928   PROTEINUR 30 (A) 03/30/2020 1928   UROBILINOGEN 0.2 05/08/2017 1503   UROBILINOGEN 1.0 04/26/2012 1328   NITRITE NEGATIVE 03/30/2020 1928   LEUKOCYTESUR NEGATIVE 03/30/2020 1928   Sepsis Labs: @LABRCNTIP (procalcitonin:4,lacticidven:4)  ) Recent Results (from the past 240 hour(s))  SARS Coronavirus 2 by RT PCR (hospital order, performed in [place] hospital lab) Nasopharyngeal Nasopharyngeal Swab     Status: None   Collection  Time: 06/10/20  8:31 AM   Specimen: Nasopharyngeal Swab  Result Value Ref Range Status   SARS Coronavirus 2 NEGATIVE NEGATIVE Final    Comment: (NOTE) SARS-CoV-2 target nucleic acids are NOT DETECTED.  The SARS-CoV-2 RNA is generally detectable in upper and lower respiratory specimens during the acute phase of infection. The lowest concentration of SARS-CoV-2 viral copies this assay can detect is 250 copies / mL. A negative result does not preclude SARS-CoV-2 infection and should not be used as the sole basis for treatment or other patient management decisions.  A negative result may occur with improper specimen collection / handling, submission of specimen other than nasopharyngeal swab, presence of viral mutation(s) within the areas targeted by this assay, and inadequate number of viral copies (<250 copies / mL). A negative result must be combined with clinical observations, patient history, and epidemiological information.  Fact Sheet for Patients:   StrictlyIdeas.no  Fact Sheet for Healthcare Providers: BankingDealers.co.za  This test is not yet approved or  cleared by the [place] FDA and has been authorized for detection and/or diagnosis of SARS-CoV-2 by FDA under an Emergency Use Authorization (EUA).  This EUA will remain in effect (meaning this test can be used) for the duration of the COVID-19 declaration under Section 564(b)(1) of the Act, 21 U.S.C. section 360bbb-3(b)(1), unless the authorization is terminated or revoked sooner.  Performed at [facility], [place] [address], [place], [place] [address]       Studies: No results found.  Scheduled Meds: . aspirin EC  81 mg Oral Daily  . atorvastatin  20 mg Oral q1800  . enoxaparin (LOVENOX) injection  40 mg Subcutaneous Q24H  . escitalopram  10 mg Oral Daily  . folic acid  1 mg Oral Daily  . gabapentin  600  mg Oral QHS  . insulin aspart  0-15 Units  Subcutaneous TID WC  . insulin aspart  0-5 Units Subcutaneous QHS  . isosorbide-hydrALAZINE  0.5 tablet Oral TID  . lidocaine  1 patch Transdermal Q24H  . mexiletine  200 mg Oral Q12H  . multivitamin with minerals  1 tablet Oral Daily  . nicotine  14 mg Transdermal Daily  . sodium chloride flush  3 mL Intravenous Q12H  . sodium chloride  2 g Oral TID WC  . spironolactone  12.5 mg Oral Daily  . thiamine  100 mg Oral Daily  . torsemide  10 mg Oral Daily    Continuous Infusions: . methocarbamol (ROBAXIN) IV       LOS: 3 days     Kayleen Memos, MD [facility] Pager [phone]  If 7PM-7AM, please contact night-coverage [email or web address] Password [facility] 06/13/2020, 2:32 PM

## 2020-06-13 NOTE — Progress Notes (Signed)
Pt not compliant with his fluid restriction, I have spoken with him multiple times this and he is still adamant about just doing what he wants. Pt keeps asking multiple workers for ice and drinks went in and spoke with him again and pt was rude and rolled his eyes now is upset. Will notify MD

## 2020-06-14 ENCOUNTER — Encounter (HOSPITAL_COMMUNITY): Payer: Medicaid Other

## 2020-06-14 LAB — BASIC METABOLIC PANEL
Anion gap: 9 (ref 5–15)
BUN: 29 mg/dL — ABNORMAL HIGH (ref 6–20)
CO2: 24 mmol/L (ref 22–32)
Calcium: 8.5 mg/dL — ABNORMAL LOW (ref 8.9–10.3)
Chloride: 96 mmol/L — ABNORMAL LOW (ref 98–111)
Creatinine, Ser: 1.62 mg/dL — ABNORMAL HIGH (ref 0.61–1.24)
GFR calc Af Amer: 53 mL/min — ABNORMAL LOW (ref >60)
GFR calc non Af Amer: 45 mL/min — ABNORMAL LOW (ref >60)
Glucose, Bld: 149 mg/dL — ABNORMAL HIGH (ref 70–99)
Potassium: 4.6 mmol/L (ref 3.5–5.1)
Sodium: 129 mmol/L — ABNORMAL LOW (ref 135–145)

## 2020-06-14 LAB — SODIUM: Sodium: 130 mmol/L — ABNORMAL LOW (ref 135–145)

## 2020-06-14 LAB — GLUCOSE, CAPILLARY: Glucose-Capillary: 133 mg/dL — ABNORMAL HIGH (ref 70–99)

## 2020-06-14 IMAGING — DX PORTABLE CHEST - 1 VIEW
2 series · 2 of 2 positions shown · non-contrast
Comparison: 06/06/2019

CLINICAL DATA: Chest pain and shortness of breath.

EXAM:
PORTABLE CHEST 1 VIEW

[chest ap (1 of 2)]
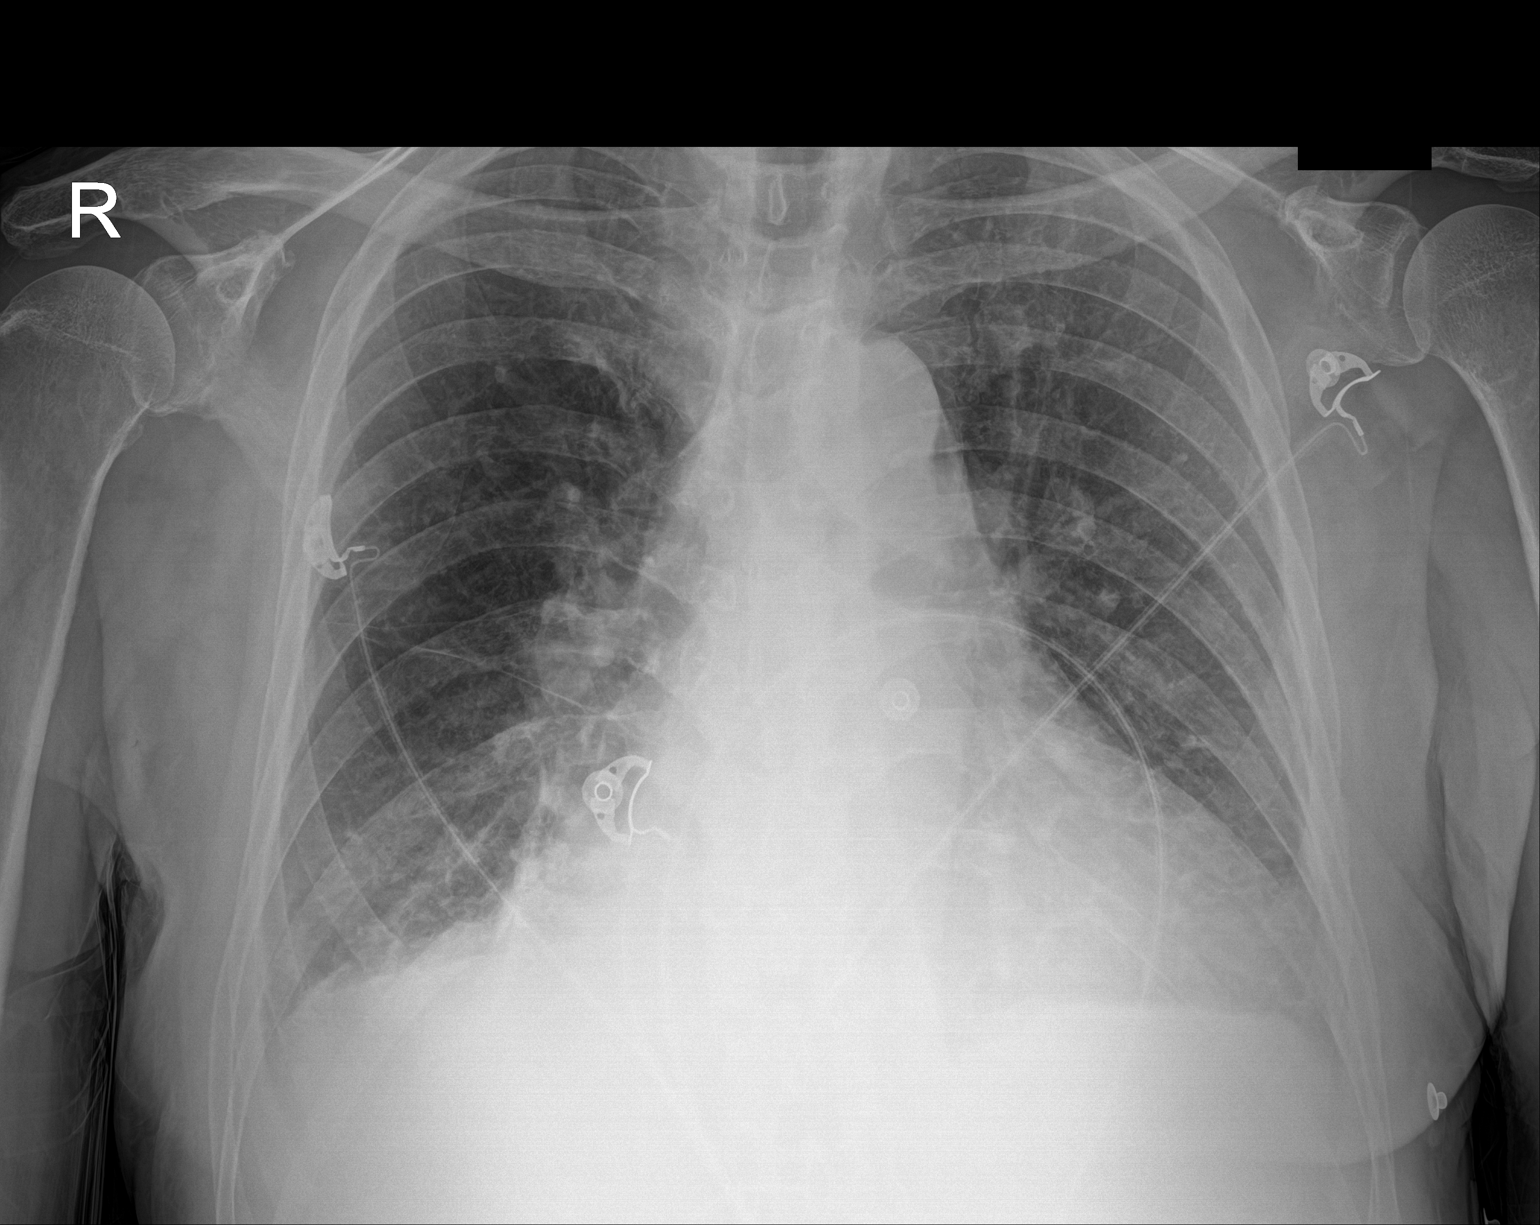

[chest ap (2 of 2)]
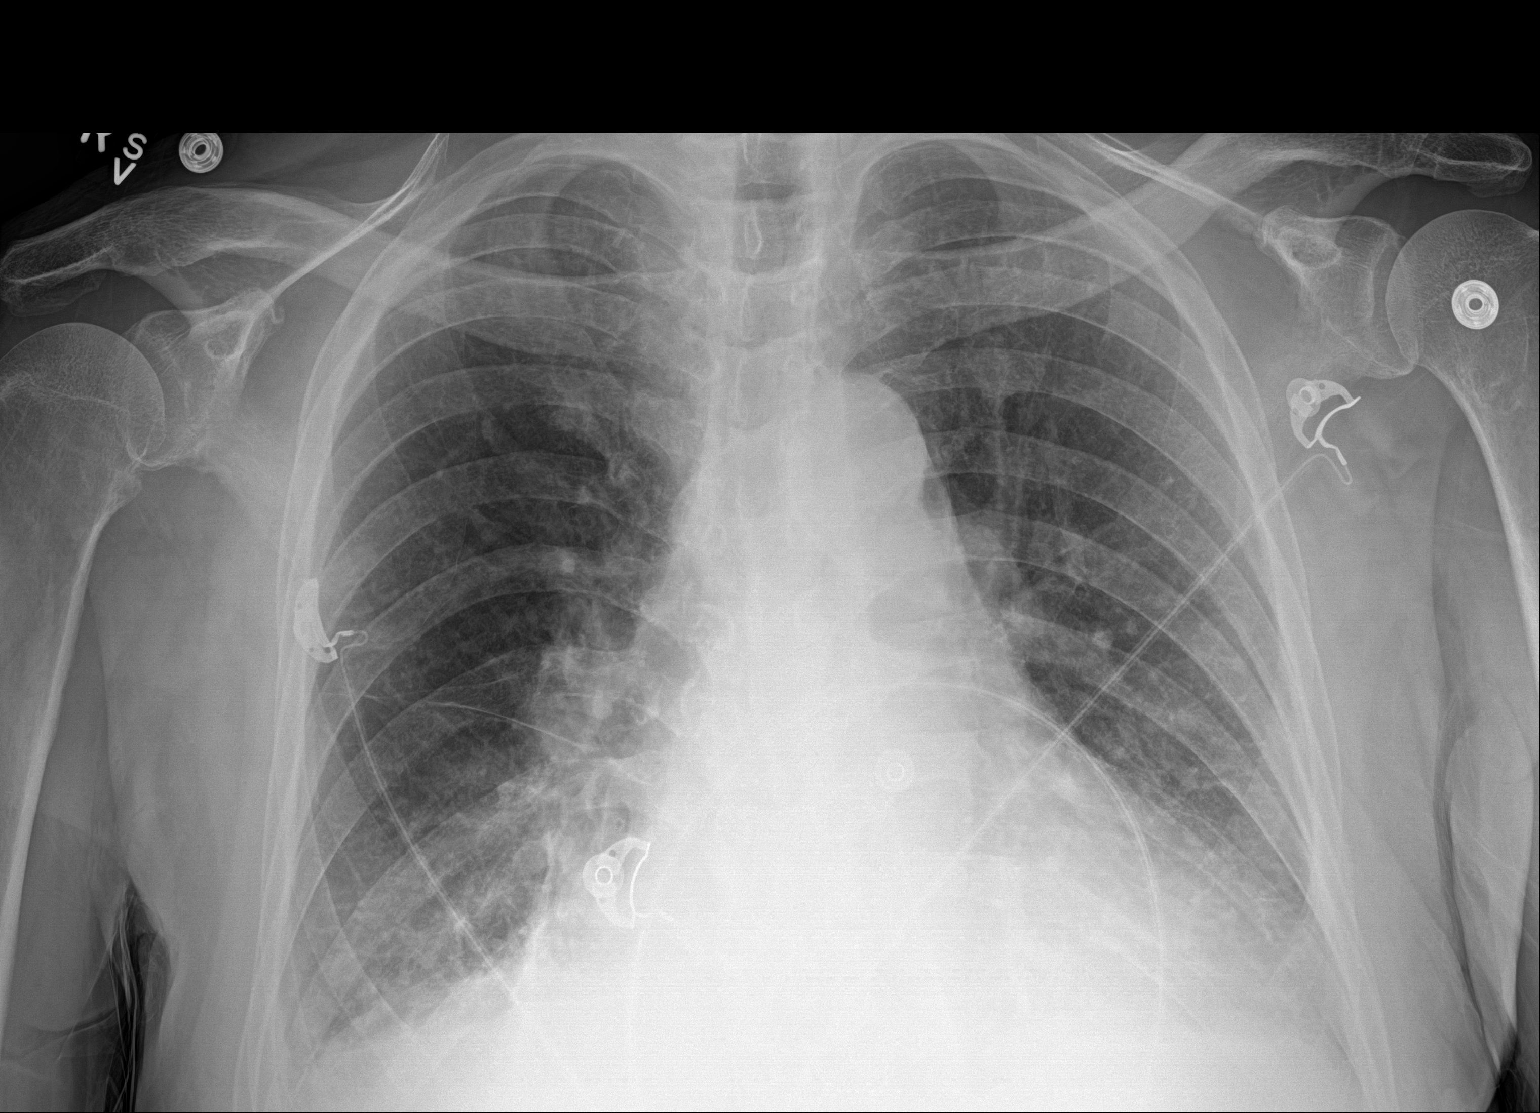

[2 of 2 positions shown; findings below may reference images not displayed]

FINDINGS: Cardiomediastinal silhouette is enlarged.

Probable mild interstitial pulmonary edema and small pleural
effusions.

Osseous structures are without acute abnormality. Soft tissues are
grossly normal.
IMPRESSION: 1. Enlarged cardiac silhouette.
2. Probable mild interstitial pulmonary edema and small pleural
effusions.

## 2020-06-14 MED ORDER — FOLIC ACID 1 MG PO TABS: 1.0000 mg | ORAL_TABLET | Freq: Every day | ORAL | 0 refills | Status: DC

## 2020-06-14 MED ORDER — HYDROXYZINE HCL 10 MG PO TABS: 10.0000 mg | ORAL_TABLET | Freq: Three times a day (TID) | ORAL | 0 refills | Status: DC | PRN

## 2020-06-14 MED ORDER — BLOOD GLUCOSE MONITOR KIT: PACK | 0 refills | Status: DC

## 2020-06-14 MED ORDER — SODIUM CHLORIDE 1 G PO TABS
2.0000 g | ORAL_TABLET | Freq: Every day | ORAL | Status: DC
  Administered 2020-06-14: 2 g via ORAL
  Filled 2020-06-14: qty 2

## 2020-06-14 MED ORDER — HYDROXYZINE HCL 10 MG PO TABS: 10.0000 mg | ORAL_TABLET | Freq: Three times a day (TID) | ORAL | Status: DC | PRN

## 2020-06-14 MED ORDER — TORSEMIDE 10 MG PO TABS: 10.0000 mg | ORAL_TABLET | Freq: Every day | ORAL | 0 refills | Status: DC

## 2020-06-14 MED ORDER — HYDROXYZINE HCL 10 MG/5ML PO SYRP: 10.0000 mg | ORAL_SOLUTION | Freq: Three times a day (TID) | ORAL | Status: DC | PRN

## 2020-06-14 MED ORDER — TRAMADOL HCL 50 MG PO TABS: 50.0000 mg | ORAL_TABLET | Freq: Two times a day (BID) | ORAL | 0 refills | Status: AC | PRN

## 2020-06-14 MED ORDER — GLIPIZIDE 5 MG PO TABS: 2.5000 mg | ORAL_TABLET | Freq: Every day | ORAL | 0 refills | Status: DC

## 2020-06-14 MED ORDER — GLIPIZIDE 5 MG PO TABS
2.5000 mg | ORAL_TABLET | Freq: Every day | ORAL | Status: DC
  Administered 2020-06-14: 2.5 mg via ORAL
  Filled 2020-06-14: qty 0.5
  Filled 2020-06-14: qty 1

## 2020-06-14 MED ORDER — ASPIRIN 81 MG PO TBEC: 81.0000 mg | DELAYED_RELEASE_TABLET | Freq: Every day | ORAL | 0 refills | Status: DC

## 2020-06-14 MED ORDER — INSULIN GLARGINE 100 UNIT/ML ~~LOC~~ SOLN
3.0000 [IU] | Freq: Every day | SUBCUTANEOUS | Status: DC
  Filled 2020-06-14: qty 0.03

## 2020-06-14 MED ORDER — ESCITALOPRAM OXALATE 10 MG PO TABS: 10.0000 mg | ORAL_TABLET | Freq: Every day | ORAL | 0 refills | Status: DC

## 2020-06-14 MED ORDER — INSULIN GLARGINE 100 UNIT/ML SOLOSTAR PEN
3.0000 [IU] | PEN_INJECTOR | Freq: Every day | SUBCUTANEOUS | 0 refills | Status: DC
Start: 2020-06-14 — End: 2020-06-14

## 2020-06-14 MED ORDER — NICOTINE 14 MG/24HR TD PT24: 14.0000 mg | MEDICATED_PATCH | Freq: Every day | TRANSDERMAL | 0 refills | Status: DC

## 2020-06-14 MED ORDER — THIAMINE HCL 100 MG PO TABS: 100.0000 mg | ORAL_TABLET | Freq: Every day | ORAL | 0 refills | Status: DC

## 2020-06-14 MED ORDER — SPIRONOLACTONE 25 MG PO TABS: 12.5000 mg | ORAL_TABLET | Freq: Every day | ORAL | 0 refills | Status: DC

## 2020-06-14 MED ORDER — SODIUM CHLORIDE 1 G PO TABS: 2.0000 g | ORAL_TABLET | Freq: Every day | ORAL | 0 refills | Status: DC

## 2020-06-14 MED ORDER — ADULT MULTIVITAMIN W/MINERALS CH: 1.0000 | ORAL_TABLET | Freq: Every day | ORAL | 0 refills | Status: DC

## 2020-06-14 NOTE — Progress Notes (Addendum)
d/c summary given to pt.  Pt verbalized understanding to d/c instructions.  Tele d/c'ed CCMD notified, IV d/c with no complications site clean and dry.  Pt in no distress and appears stable at this time.  Pt transported to main entrance vis w/c by RN.

## 2020-06-14 NOTE — Social Work (Signed)
Pt RN provided with cab voucher, cab scheduled for pt pick up. Confirmed home address and that pt has a means of getting into home.  Westley Hummer, MSW, [place] Work

## 2020-06-14 NOTE — Discharge Summary (Signed)
Physician Discharge Summary  Brad [name] BFX:[id number] DOB: 05-Aug-1961 DOA: 06/09/2020  PCP: Charlott Rakes, MD  Admit date: 06/09/2020 Discharge date: 06/14/2020  Admitted From: home Discharge disposition: home   Recommendations for Outpatient Follow-Up:   1. Follow up with PCP 1 week for evaluation of volume status and sodium level.   Discharge Diagnosis:   Principal Problem:   Chronic hyponatremia Active Problems:   Essential hypertension   Permanent atrial fibrillation   Chronic combined systolic and diastolic CHF (congestive heart failure) (HCC)   DM type 2 (diabetes mellitus, type 2) (HCC)   CKD (chronic kidney disease) stage 3, GFR 30-59 ml/min   Alcohol abuse   Tobacco abuse   Chest pain    Discharge Condition: Improved.  Diet recommendation:  heart healthy.  Carbohydrate-modified.   Wound care: None.  Code status: Full.   History of Present Illness:   Brad [name] is a 59 y.o. male with medical history significant of *significant ofETOH abuse, Afib, CHF, HTN with recurrent acute on chronic hyponatremia presented 6/20 with the same (7 admissions and 5 ER visits in the last 6 months for various reasons). He was last admitted from 6/8-15 for acute on chronic systolic CHF with hypervolemic hyponatremia associated with CHF and chronic beer potomania.  He reported that he was coming in "for the same thing."  He reported central chest pain that was markedly worse with superficial palpation.  He drank just one can (16 oz) of beer that day and denied being told in the past that he should abstain totally.  Work up in the ED revealedNa++ 120, did not appear to be volume overloaded.  Recent med changes with last hospitalization, appeared to be compliant.  He reported continuing to drink but is "cutting back."  Complained of constant central chest pressure with stable troponins, unlikely ACS.  Not obviously withdrawing.  Not confused, did not appear to be  symptomatic.    Hospital Course by Problem:   Acute on chronic hyponatremia -Thought to be related to chronic CHF as well as beer potomania and chronic diuretic use. Na++ appears to be widely variable, from 117 to normal. At discharge sodium 129 close to baseline of 130. he was placed on fluid restriction and sodium tabs. He was grossly non-complaint with fluid restriction. He remained euvolemic during this hospitalization. Most likely associated with beer potomania. Cymbalta may also be contributing so this was held and stopped on discharge. At discharge will  continue Aldactone and  Demadex with dosages adjusted.   Chest pain -Patient with chronic substernal chest pain that was reproducible. CXR unremarkable. Cardiac HS troponinmildly elevated but negative delta; he has chronic troponin elevation and this does not appear to be significantly different from baseline. EKG isnot indicative of acute ischemia.  No events on tele. Resumed ASA 81 mg daily(previously on AC but this has been stopped) No significant CAD in 2017. Continue isosorbide (Bidil). No complaints of CP at discharge.   HTN -Continue Bidil. Fair control. meds as noted above.   HLD -Continue Lipitor. Lipids were checkedon 4/29(TC 206, HDL104, LDL86, TG78)   DM -Diet controlled. Last A1c was 6.9 on 6/8. Glipizide started and education regarding DM control.  Chronic combined CHF -04/03/20 echo with EF 35-40% and inability to assess diastolic function. Remained compensated during this hospitalization. Continue Aldactone and Demadex at discharge with doses adjusted.   Chronic afib -Resume Mexiletine - "has not picked up from pharmacy but does intend to start taking" per med  rec.Taken off AC due to recurrent falls.Restarting 81 mg ASA, as above  ETOH dependence with cirrhosis -Reports drinking only 1 beer on day of admission. Cirrhosis due to Hep C and ETOH. No signs symptoms withdrawal  Stage 3a CKD -stable  at/near baseline creatinine  Tobacco dependence -Encourage cessation.     Medical Consultants:      Discharge Exam:   Vitals:   06/14/20 0519 06/14/20 0855  BP: (!) 138/97 117/90  Pulse: 71 89  Resp: 18 18  Temp: 97.8 F (36.6 C)   SpO2: 100% 100%   Vitals:   06/13/20 1752 06/14/20 0043 06/14/20 0519 06/14/20 0855  BP: 118/89 126/82 (!) 138/97 117/90  Pulse: 67 65 71 89  Resp:  '18 18 18  ' Temp:  97.8 F (36.6 C) 97.8 F (36.6 C)   TempSrc:  Oral Oral   SpO2:  99% 100% 100%  Weight:   79.5 kg     General exam: Appears calm and comfortable. Somewhat irritable and dissatisfied with fluid restriction Respiratory system: Clear to auscultation. Respiratory effort normal. Cardiovascular system: S1 & S2 heard, RRR. No JVD,  rubs, gallops or clicks. No murmurs. Gastrointestinal system: Abdomen is nondistended, soft and nontender. No organomegaly or masses felt. Normal bowel sounds heard. Central nervous system: Alert and oriented. No focal neurological deficits. Extremities: No clubbing,  or cyanosis. No edema. Skin: No rashes, lesions or ulcers. Psychiatry: Judgement and insight appear normal. Mood & affect appropriate.    The results of significant diagnostics from this hospitalization (including imaging, microbiology, ancillary and laboratory) are listed below for reference.     Procedures and Diagnostic Studies:   DG Chest 2 View  Result Date: 06/10/2020 CLINICAL DATA:  Central nonradiating chest pain. EXAM: CHEST - 2 VIEW COMPARISON:  Radiograph 05/29/2020. FINDINGS: Chronic cardiomegaly with unchanged mediastinal contours. Aortic atherosclerosis. No significant pleural effusion. Mild vascular congestion without pulmonary edema. No focal airspace disease. No pneumothorax. No acute osseous abnormalities are seen. IMPRESSION: Chronic cardiomegaly with mild vascular congestion. Electronically Signed   By: Keith Rake M.D.   On: 06/10/2020 00:18     Labs:    Basic Metabolic Panel: Recent Labs  Lab 06/11/20 0700 06/11/20 0700 06/11/20 1552 06/11/20 1743 06/12/20 0548 06/12/20 1134 06/13/20 0441 06/13/20 1106 06/13/20 1805 06/13/20 2340 06/14/20 0444  NA 121*   < > 123*   < > 123*   < > 127* 128* 129* 130* 129*  K 4.0   < > 4.4   < > 4.5  --  4.8  --   --   --  4.6  CL 89*  --  88*  --  88*  --  93*  --   --   --  96*  CO2 21*  --  25  --  24  --  24  --   --   --  24  GLUCOSE 115*  --  117*  --  118*  --  135*  --   --   --  149*  BUN 16  --  18  --  21*  --  23*  --   --   --  29*  CREATININE 1.45*  --  1.50*  --  1.58*  --  1.61*  --   --   --  1.62*  CALCIUM 8.7*  --  9.0  --  8.8*  --  8.8*  --   --   --  8.5*   < > =  values in this interval not displayed.   GFR Estimated Creatinine Clearance: 51.6 mL/min (A) (by C-G formula based on SCr of 1.62 mg/dL (H)). Liver Function Tests: No results for input(s): AST, ALT, ALKPHOS, BILITOT, PROT, ALBUMIN in the last 168 hours. No results for input(s): LIPASE, AMYLASE in the last 168 hours. No results for input(s): AMMONIA in the last 168 hours. Coagulation profile No results for input(s): INR, PROTIME in the last 168 hours.  CBC: Recent Labs  Lab 06/10/20 0006 06/11/20 0700  WBC 4.0 3.3*  HGB 12.2* 11.2*  HCT 35.5* 33.3*  MCV 88.3 89.0  PLT 278 218   Cardiac Enzymes: No results for input(s): CKTOTAL, CKMB, CKMBINDEX, TROPONINI in the last 168 hours. BNP: Invalid input(s): POCBNP CBG: Recent Labs  Lab 06/13/20 0614 06/13/20 1107 06/13/20 1610 06/13/20 2138 06/14/20 0607  GLUCAP 169* 88 119* 137* 133*   D-Dimer No results for input(s): DDIMER in the last 72 hours. Hgb A1c No results for input(s): HGBA1C in the last 72 hours. Lipid Profile No results for input(s): CHOL, HDL, LDLCALC, TRIG, CHOLHDL, LDLDIRECT in the last 72 hours. Thyroid function studies No results for input(s): TSH, T4TOTAL, T3FREE, THYROIDAB in the last 72 hours.  Invalid input(s):  FREET3 Anemia work up No results for input(s): VITAMINB12, FOLATE, FERRITIN, TIBC, IRON, RETICCTPCT in the last 72 hours. Microbiology Recent Results (from the past 240 hour(s))  SARS Coronavirus 2 by RT PCR (hospital order, performed in [facility] hospital lab) Nasopharyngeal Nasopharyngeal Swab     Status: None   Collection Time: 06/10/20  8:31 AM   Specimen: Nasopharyngeal Swab  Result Value Ref Range Status   SARS Coronavirus 2 NEGATIVE NEGATIVE Final    Comment: (NOTE) SARS-CoV-2 target nucleic acids are NOT DETECTED.  The SARS-CoV-2 RNA is generally detectable in upper and lower respiratory specimens during the acute phase of infection. The lowest concentration of SARS-CoV-2 viral copies this assay can detect is 250 copies / mL. A negative result does not preclude SARS-CoV-2 infection and should not be used as the sole basis for treatment or other patient management decisions.  A negative result may occur with improper specimen collection / handling, submission of specimen other than nasopharyngeal swab, presence of viral mutation(s) within the areas targeted by this assay, and inadequate number of viral copies (<250 copies / mL). A negative result must be combined with clinical observations, patient history, and epidemiological information.  Fact Sheet for Patients:   StrictlyIdeas.no  Fact Sheet for Healthcare Providers: BankingDealers.co.za  This test is not yet approved or  cleared by the [place] FDA and has been authorized for detection and/or diagnosis of SARS-CoV-2 by FDA under an Emergency Use Authorization (EUA).  This EUA will remain in effect (meaning this test can be used) for the duration of the COVID-19 declaration under Section 564(b)(1) of the Act, 21 U.S.C. section 360bbb-3(b)(1), unless the authorization is terminated or revoked sooner.  Performed at [facility], [place] [address],  [place], [place] [address]      Discharge Instructions:   Discharge Instructions    Diet - low sodium heart healthy   Complete by: As directed    Discharge instructions   Complete by: As directed    Take medications as prescribed Follow up with PCP 1 week to check sodium level   Increase activity slowly   Complete by: As directed      Allergies as of 06/14/2020      Reactions   Angiotensin Receptor  Blockers Anaphylaxis, Other (See Comments)   (Angioedema also with Lisinopril, therefore ARB's are contraindicated)   Lisinopril Anaphylaxis, Swelling   Throat swelling   Pamelor [nortriptyline Hcl] Anaphylaxis, Swelling   Throat swells      Medication List    STOP taking these medications   DULoxetine 60 MG capsule Commonly known as: CYMBALTA     TAKE these medications   aspirin 81 MG EC tablet Take 1 tablet (81 mg total) by mouth daily. Swallow whole.   atorvastatin 20 MG tablet Commonly known as: LIPITOR Take 1 tablet (20 mg total) by mouth daily at 6 PM.   blood glucose meter kit and supplies Kit Dispense based on patient and insurance preference. Use up to four times daily as directed. (FOR ICD-9 250.00, 250.01).   escitalopram 10 MG tablet Commonly known as: LEXAPRO Take 1 tablet (10 mg total) by mouth daily.   folic acid 1 MG tablet Commonly known as: FOLVITE Take 1 tablet (1 mg total) by mouth daily.   gabapentin 300 MG capsule Commonly known as: NEURONTIN Take 2 capsules (600 mg total) by mouth at bedtime.   glipiZIDE 5 MG tablet Commonly known as: GLUCOTROL Take 0.5 tablets (2.5 mg total) by mouth daily before breakfast.   hydrOXYzine 10 MG tablet Commonly known as: ATARAX/VISTARIL Take 1 tablet (10 mg total) by mouth 3 (three) times daily as needed for up to 20 doses for anxiety.   isosorbide-hydrALAZINE 20-37.5 MG tablet Commonly known as: BIDIL Take 0.5 tablets by mouth 3 (three) times daily.   mexiletine 200 MG capsule Commonly known as:  MEXITIL Take 1 capsule (200 mg total) by mouth every 12 (twelve) hours.   multivitamin with minerals Tabs tablet Take 1 tablet by mouth daily.   nicotine 14 mg/24hr patch Commonly known as: NICODERM CQ - dosed in mg/24 hours Place 1 patch (14 mg total) onto the skin daily.   sodium chloride 1 g tablet Take 2 tablets (2 g total) by mouth daily.   spironolactone 25 MG tablet Commonly known as: ALDACTONE Take 0.5 tablets (12.5 mg total) by mouth daily. What changed: how much to take   thiamine 100 MG tablet Take 1 tablet (100 mg total) by mouth daily.   torsemide 10 MG tablet Commonly known as: DEMADEX Take 1 tablet (10 mg total) by mouth daily. What changed:   medication strength  how much to take   traMADol 50 MG tablet Commonly known as: ULTRAM Take 1 tablet (50 mg total) by mouth every 12 (twelve) hours as needed for up to 3 days for severe pain.       Follow-up Information    Charlott Rakes, MD. Call in 1 day(s).   Specialty: Family Medicine Why: Call for a post hospital follow up appointment Contact information: [place] [address] [phone]        [name], MD .   Specialty: Cardiology Contact information: [address] [place] [address] [phone]                Time coordinating discharge: 40 minutes  Signed:  Radene Gunning NP  [facility] 06/14/2020, 10:53 AM

## 2020-06-14 NOTE — Progress Notes (Signed)
Inpatient Diabetes Program Recommendations  AACE/ADA: New Consensus Statement on Inpatient Glycemic Control (2015)  Target Ranges:  Prepandial:   less than 140 mg/dL      Peak postprandial:   less than 180 mg/dL (1-2 hours)      Critically ill patients:  140 - 180 mg/dL   Lab Results  Component Value Date   GLUCAP 133 (H) 06/14/2020   HGBA1C 6.9 (H) 05/29/2020    Review of Glycemic Control  Diabetes history: DM2 Outpatient Diabetes medications: None Current orders for Inpatient glycemic control: Novolog 0-15 units tidwc and hs, glipizide 2.5 mg QAM  HgbA1C - 6.9% 149, 133 mg/dL Creat 1.61  Inpatient Diabetes Program Recommendations:     Spoke with pt about his diabetes and HgbA1C of 6.9%. Pt states he doesn't eat any sweets and occasionally drinks a soda. Discussed impact of nutrition, exercise, stress, sickness, and medications on diabetes control.  Will be d/ced on glipizide 2.5 mg QAM.  Pt appreciative of visit.   Thank you. Lorenda Peck, RD, LDN, CDE Inpatient Diabetes Coordinator [phone]

## 2020-06-15 ENCOUNTER — Other Ambulatory Visit: Payer: Self-pay

## 2020-06-15 ENCOUNTER — Telehealth: Payer: Self-pay

## 2020-06-15 ENCOUNTER — Emergency Department (HOSPITAL_COMMUNITY)
Admission: EM | Admit: 2020-06-15 | Discharge: 2020-06-15 | Disposition: A | Discharge status: Home / Self Care | Payer: Medicaid Other | Attending: Emergency Medicine | Admitting: Emergency Medicine

## 2020-06-15 ENCOUNTER — Encounter (HOSPITAL_COMMUNITY): Payer: Self-pay | Admitting: Emergency Medicine

## 2020-06-15 DIAGNOSIS — Z7982 Long term (current) use of aspirin: Secondary | ICD-10-CM | POA: Insufficient documentation

## 2020-06-15 DIAGNOSIS — I130 Hypertensive heart and chronic kidney disease with heart failure and stage 1 through stage 4 chronic kidney disease, or unspecified chronic kidney disease: Secondary | ICD-10-CM | POA: Diagnosis not present

## 2020-06-15 DIAGNOSIS — E1122 Type 2 diabetes mellitus with diabetic chronic kidney disease: Secondary | ICD-10-CM | POA: Diagnosis not present

## 2020-06-15 DIAGNOSIS — Z8546 Personal history of malignant neoplasm of prostate: Secondary | ICD-10-CM | POA: Insufficient documentation

## 2020-06-15 DIAGNOSIS — Z7984 Long term (current) use of oral hypoglycemic drugs: Secondary | ICD-10-CM | POA: Insufficient documentation

## 2020-06-15 DIAGNOSIS — R05 Cough: Secondary | ICD-10-CM | POA: Diagnosis not present

## 2020-06-15 DIAGNOSIS — N183 Chronic kidney disease, stage 3 unspecified: Secondary | ICD-10-CM | POA: Insufficient documentation

## 2020-06-15 DIAGNOSIS — F17210 Nicotine dependence, cigarettes, uncomplicated: Secondary | ICD-10-CM | POA: Diagnosis not present

## 2020-06-15 DIAGNOSIS — I5042 Chronic combined systolic (congestive) and diastolic (congestive) heart failure: Secondary | ICD-10-CM | POA: Insufficient documentation

## 2020-06-15 DIAGNOSIS — R0789 Other chest pain: Secondary | ICD-10-CM | POA: Insufficient documentation

## 2020-06-15 DIAGNOSIS — Z79899 Other long term (current) drug therapy: Secondary | ICD-10-CM | POA: Insufficient documentation

## 2020-06-15 DIAGNOSIS — R059 Cough, unspecified: Secondary | ICD-10-CM

## 2020-06-15 LAB — CBC
HCT: 35.6 % — ABNORMAL LOW (ref 39.0–52.0)
Hemoglobin: 11.6 g/dL — ABNORMAL LOW (ref 13.0–17.0)
MCH: 30.1 pg (ref 26.0–34.0)
MCHC: 32.6 g/dL (ref 30.0–36.0)
MCV: 92.5 fL (ref 80.0–100.0)
Platelets: 238 10*3/uL (ref 150–400)
RBC: 3.85 MIL/uL — ABNORMAL LOW (ref 4.22–5.81)
RDW: 14 % (ref 11.5–15.5)
WBC: 6.1 10*3/uL (ref 4.0–10.5)
nRBC: 0 % (ref 0.0–0.2)

## 2020-06-15 LAB — BASIC METABOLIC PANEL
Anion gap: 13 (ref 5–15)
BUN: 30 mg/dL — ABNORMAL HIGH (ref 6–20)
CO2: 19 mmol/L — ABNORMAL LOW (ref 22–32)
Calcium: 9 mg/dL (ref 8.9–10.3)
Chloride: 95 mmol/L — ABNORMAL LOW (ref 98–111)
Creatinine, Ser: 1.49 mg/dL — ABNORMAL HIGH (ref 0.61–1.24)
GFR calc Af Amer: 58 mL/min — ABNORMAL LOW (ref >60)
GFR calc non Af Amer: 50 mL/min — ABNORMAL LOW (ref >60)
Glucose, Bld: 65 mg/dL — ABNORMAL LOW (ref 70–99)
Potassium: 4.9 mmol/L (ref 3.5–5.1)
Sodium: 127 mmol/L — ABNORMAL LOW (ref 135–145)

## 2020-06-15 LAB — TROPONIN I (HIGH SENSITIVITY)
Troponin I (High Sensitivity): 27 ng/L — ABNORMAL HIGH (ref <18)
Troponin I (High Sensitivity): 26 ng/L — ABNORMAL HIGH (ref <18)

## 2020-06-15 MED ORDER — ACETAMINOPHEN 500 MG PO TABS
1000.0000 mg | ORAL_TABLET | Freq: Once | ORAL | Status: AC
  Administered 2020-06-15: 1000 mg via ORAL
  Filled 2020-06-15: qty 2

## 2020-06-15 MED ORDER — HYDROCODONE-HOMATROPINE 5-1.5 MG/5ML PO SYRP: 5.0000 mL | ORAL_SOLUTION | Freq: Four times a day (QID) | ORAL | 0 refills | Status: DC | PRN

## 2020-06-15 MED ORDER — SODIUM CHLORIDE 0.9% FLUSH: 3.0000 mL | Freq: Once | INTRAVENOUS | Status: DC

## 2020-06-15 MED ORDER — HYDROCODONE-HOMATROPINE 5-1.5 MG/5ML PO SYRP
5.0000 mL | ORAL_SOLUTION | Freq: Once | ORAL | Status: AC
  Administered 2020-06-15: 5 mL via ORAL
  Filled 2020-06-15: qty 5
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
## 2020-06-15 NOTE — ED Triage Notes (Signed)
Per ems, pt from home. Pt reports worsening substernal cp and sob for the past week. Worse with movement, palpation. EMS gave 324 ASA and 2 nitro with no improvement. Was recently discharged for low sodium. EMS BP 160/100, HR 100, irregular w hx of Afib, RR 20, 99% RA. 18g L wrist. Hx of CHF.   Pt arrives with bed bugs on self.

## 2020-06-15 NOTE — ED Notes (Signed)
Discharge instructions discussed with pt. Pt verbalized understanding. Pt stable and ambulatory. No signature pad available. 

## 2020-06-15 NOTE — Telephone Encounter (Signed)
  Transition Care Management Follow-up Telephone Call Date of discharge and from where: 06/15/2020 [facility]   Called pt [phone] [facility])   [phone]  unable to reach/ Left voice message to call back. Name and phone nr provided.

## 2020-06-15 NOTE — ED Notes (Signed)
Pt taken to shower, given new clothing, personal belongings placed in pt belonging bag by pt

## 2020-06-18 ENCOUNTER — Telehealth: Payer: Self-pay

## 2020-06-18 NOTE — Telephone Encounter (Addendum)
Transition Care Management Follow-up Telephone Call Date of discharge and from where: 06/14/2020, [facility]   Call placed to patient # [phone], message left with call back requested to this CM.  He needs to schedule a hospital follow up appointment.   Call placed to [facility] # [phone] to check on status of approval for housing and documents needed to complete the application.  Spoke to [place] who referred this CM to [employer] - section 8 supervisor # [phone].  Message left for Ms Selena Batten requesting a call back to this CM.  Call received from Ms Selena Batten. She said that the patient has been selected for the project based voucher and this CM needs to speak to Ms Thompson Caul # [phone] to inquire about what is needed to complete the  Application as well as confirm the time frame when patient would be eligible to move in.   Message left with call back requested to this CM.

## 2020-06-20 ENCOUNTER — Telehealth: Payer: Self-pay

## 2020-06-20 NOTE — Telephone Encounter (Signed)
call placed to Brad [name] # [place]  to inquire about what is needed to complete the application as well as confirm the time frame when patient would be eligible to move in.   Message left with call back requested to this CM # [phone].   This CM also spoke to the patient and informed him that this CM has left a message at the housing authority to check on the status of his application.

## 2020-06-21 ENCOUNTER — Inpatient Hospital Stay (HOSPITAL_COMMUNITY)
Admission: EM | Admit: 2020-06-21 | Discharge: 2020-06-24 | DRG: 291 | Disposition: A | Discharge status: Home / Self Care | Payer: Medicaid Other | Attending: Internal Medicine | Admitting: Internal Medicine

## 2020-06-21 ENCOUNTER — Encounter (HOSPITAL_COMMUNITY): Payer: Self-pay | Admitting: *Deleted

## 2020-06-21 ENCOUNTER — Emergency Department (HOSPITAL_COMMUNITY): Payer: Medicaid Other

## 2020-06-21 ENCOUNTER — Other Ambulatory Visit: Payer: Self-pay

## 2020-06-21 DIAGNOSIS — I2510 Atherosclerotic heart disease of native coronary artery without angina pectoris: Secondary | ICD-10-CM | POA: Diagnosis present

## 2020-06-21 DIAGNOSIS — Z7982 Long term (current) use of aspirin: Secondary | ICD-10-CM

## 2020-06-21 DIAGNOSIS — R55 Syncope and collapse: Secondary | ICD-10-CM | POA: Diagnosis present

## 2020-06-21 DIAGNOSIS — I493 Ventricular premature depolarization: Secondary | ICD-10-CM | POA: Diagnosis not present

## 2020-06-21 DIAGNOSIS — B182 Chronic viral hepatitis C: Secondary | ICD-10-CM | POA: Diagnosis present

## 2020-06-21 DIAGNOSIS — B1920 Unspecified viral hepatitis C without hepatic coma: Secondary | ICD-10-CM | POA: Diagnosis present

## 2020-06-21 DIAGNOSIS — F329 Major depressive disorder, single episode, unspecified: Secondary | ICD-10-CM | POA: Diagnosis present

## 2020-06-21 DIAGNOSIS — E1122 Type 2 diabetes mellitus with diabetic chronic kidney disease: Secondary | ICD-10-CM | POA: Diagnosis present

## 2020-06-21 DIAGNOSIS — Z79899 Other long term (current) drug therapy: Secondary | ICD-10-CM

## 2020-06-21 DIAGNOSIS — G8929 Other chronic pain: Secondary | ICD-10-CM | POA: Diagnosis present

## 2020-06-21 DIAGNOSIS — Z7141 Alcohol abuse counseling and surveillance of alcoholic: Secondary | ICD-10-CM

## 2020-06-21 DIAGNOSIS — I509 Heart failure, unspecified: Secondary | ICD-10-CM | POA: Diagnosis not present

## 2020-06-21 DIAGNOSIS — S0083XA Contusion of other part of head, initial encounter: Secondary | ICD-10-CM | POA: Diagnosis present

## 2020-06-21 DIAGNOSIS — Z20822 Contact with and (suspected) exposure to covid-19: Secondary | ICD-10-CM | POA: Diagnosis present

## 2020-06-21 DIAGNOSIS — I5023 Acute on chronic systolic (congestive) heart failure: Secondary | ICD-10-CM | POA: Diagnosis present

## 2020-06-21 DIAGNOSIS — Z888 Allergy status to other drugs, medicaments and biological substances status: Secondary | ICD-10-CM

## 2020-06-21 DIAGNOSIS — E1159 Type 2 diabetes mellitus with other circulatory complications: Secondary | ICD-10-CM

## 2020-06-21 DIAGNOSIS — R079 Chest pain, unspecified: Secondary | ICD-10-CM | POA: Diagnosis present

## 2020-06-21 DIAGNOSIS — W19XXXA Unspecified fall, initial encounter: Secondary | ICD-10-CM | POA: Diagnosis present

## 2020-06-21 DIAGNOSIS — G4700 Insomnia, unspecified: Secondary | ICD-10-CM | POA: Diagnosis present

## 2020-06-21 DIAGNOSIS — E1140 Type 2 diabetes mellitus with diabetic neuropathy, unspecified: Secondary | ICD-10-CM | POA: Diagnosis present

## 2020-06-21 DIAGNOSIS — K7460 Unspecified cirrhosis of liver: Secondary | ICD-10-CM | POA: Diagnosis present

## 2020-06-21 DIAGNOSIS — Z7289 Other problems related to lifestyle: Secondary | ICD-10-CM

## 2020-06-21 DIAGNOSIS — E871 Hypo-osmolality and hyponatremia: Secondary | ICD-10-CM | POA: Diagnosis present

## 2020-06-21 DIAGNOSIS — N1831 Chronic kidney disease, stage 3a: Secondary | ICD-10-CM | POA: Diagnosis present

## 2020-06-21 DIAGNOSIS — Z9119 Patient's noncompliance with other medical treatment and regimen: Secondary | ICD-10-CM

## 2020-06-21 DIAGNOSIS — N179 Acute kidney failure, unspecified: Secondary | ICD-10-CM | POA: Diagnosis present

## 2020-06-21 DIAGNOSIS — Z8249 Family history of ischemic heart disease and other diseases of the circulatory system: Secondary | ICD-10-CM

## 2020-06-21 DIAGNOSIS — I428 Other cardiomyopathies: Secondary | ICD-10-CM | POA: Diagnosis present

## 2020-06-21 DIAGNOSIS — I130 Hypertensive heart and chronic kidney disease with heart failure and stage 1 through stage 4 chronic kidney disease, or unspecified chronic kidney disease: Principal | ICD-10-CM | POA: Diagnosis present

## 2020-06-21 DIAGNOSIS — Z8673 Personal history of transient ischemic attack (TIA), and cerebral infarction without residual deficits: Secondary | ICD-10-CM

## 2020-06-21 DIAGNOSIS — E785 Hyperlipidemia, unspecified: Secondary | ICD-10-CM | POA: Diagnosis present

## 2020-06-21 DIAGNOSIS — I083 Combined rheumatic disorders of mitral, aortic and tricuspid valves: Secondary | ICD-10-CM | POA: Diagnosis present

## 2020-06-21 DIAGNOSIS — R296 Repeated falls: Secondary | ICD-10-CM | POA: Diagnosis present

## 2020-06-21 DIAGNOSIS — I4821 Permanent atrial fibrillation: Secondary | ICD-10-CM | POA: Diagnosis present

## 2020-06-21 DIAGNOSIS — Z96652 Presence of left artificial knee joint: Secondary | ICD-10-CM | POA: Diagnosis present

## 2020-06-21 DIAGNOSIS — Z794 Long term (current) use of insulin: Secondary | ICD-10-CM

## 2020-06-21 DIAGNOSIS — F1010 Alcohol abuse, uncomplicated: Secondary | ICD-10-CM | POA: Diagnosis present

## 2020-06-21 DIAGNOSIS — G253 Myoclonus: Secondary | ICD-10-CM

## 2020-06-21 DIAGNOSIS — Z8546 Personal history of malignant neoplasm of prostate: Secondary | ICD-10-CM

## 2020-06-21 DIAGNOSIS — E1169 Type 2 diabetes mellitus with other specified complication: Secondary | ICD-10-CM | POA: Diagnosis present

## 2020-06-21 DIAGNOSIS — K219 Gastro-esophageal reflux disease without esophagitis: Secondary | ICD-10-CM | POA: Diagnosis present

## 2020-06-21 DIAGNOSIS — F17210 Nicotine dependence, cigarettes, uncomplicated: Secondary | ICD-10-CM | POA: Diagnosis present

## 2020-06-21 DIAGNOSIS — Z7984 Long term (current) use of oral hypoglycemic drugs: Secondary | ICD-10-CM

## 2020-06-21 LAB — BRAIN NATRIURETIC PEPTIDE: B Natriuretic Peptide: 584.3 pg/mL — ABNORMAL HIGH (ref 0.0–100.0)

## 2020-06-21 LAB — CBC
HCT: 34.8 % — ABNORMAL LOW (ref 39.0–52.0)
Hemoglobin: 11.6 g/dL — ABNORMAL LOW (ref 13.0–17.0)
MCH: 29.7 pg (ref 26.0–34.0)
MCHC: 33.3 g/dL (ref 30.0–36.0)
MCV: 89 fL (ref 80.0–100.0)
Platelets: 268 10*3/uL (ref 150–400)
RBC: 3.91 MIL/uL — ABNORMAL LOW (ref 4.22–5.81)
RDW: 14.1 % (ref 11.5–15.5)
WBC: 3.7 10*3/uL — ABNORMAL LOW (ref 4.0–10.5)
nRBC: 0 % (ref 0.0–0.2)

## 2020-06-21 LAB — COMPREHENSIVE METABOLIC PANEL
ALT: 27 U/L (ref 0–44)
ALT: 27 U/L (ref 0–44)
AST: 33 U/L (ref 15–41)
AST: 28 U/L (ref 15–41)
Albumin: 3 g/dL — ABNORMAL LOW (ref 3.5–5.0)
Albumin: 3.2 g/dL — ABNORMAL LOW (ref 3.5–5.0)
Alkaline Phosphatase: 50 U/L (ref 38–126)
Alkaline Phosphatase: 47 U/L (ref 38–126)
Anion gap: 11 (ref 5–15)
Anion gap: 11 (ref 5–15)
BUN: 13 mg/dL (ref 6–20)
BUN: 15 mg/dL (ref 6–20)
CO2: 21 mmol/L — ABNORMAL LOW (ref 22–32)
CO2: 20 mmol/L — ABNORMAL LOW (ref 22–32)
Calcium: 8.4 mg/dL — ABNORMAL LOW (ref 8.9–10.3)
Calcium: 8.6 mg/dL — ABNORMAL LOW (ref 8.9–10.3)
Chloride: 95 mmol/L — ABNORMAL LOW (ref 98–111)
Chloride: 97 mmol/L — ABNORMAL LOW (ref 98–111)
Creatinine, Ser: 1.47 mg/dL — ABNORMAL HIGH (ref 0.61–1.24)
Creatinine, Ser: 1.43 mg/dL — ABNORMAL HIGH (ref 0.61–1.24)
GFR calc Af Amer: 59 mL/min — ABNORMAL LOW (ref >60)
GFR calc Af Amer: >60 mL/min (ref >60)
GFR calc non Af Amer: 51 mL/min — ABNORMAL LOW (ref >60)
GFR calc non Af Amer: 53 mL/min — ABNORMAL LOW (ref >60)
Glucose, Bld: 175 mg/dL — ABNORMAL HIGH (ref 70–99)
Glucose, Bld: 151 mg/dL — ABNORMAL HIGH (ref 70–99)
Potassium: 4.3 mmol/L (ref 3.5–5.1)
Potassium: 4.2 mmol/L (ref 3.5–5.1)
Sodium: 127 mmol/L — ABNORMAL LOW (ref 135–145)
Sodium: 128 mmol/L — ABNORMAL LOW (ref 135–145)
Total Bilirubin: 0.5 mg/dL (ref 0.3–1.2)
Total Bilirubin: 0.8 mg/dL (ref 0.3–1.2)
Total Protein: 6.7 g/dL (ref 6.5–8.1)
Total Protein: 6.9 g/dL (ref 6.5–8.1)

## 2020-06-21 LAB — CBC WITH DIFFERENTIAL/PLATELET
Abs Immature Granulocytes: 0.01 10*3/uL (ref 0.00–0.07)
Basophils Absolute: 0 10*3/uL (ref 0.0–0.1)
Basophils Relative: 1 %
Eosinophils Absolute: 0.1 10*3/uL (ref 0.0–0.5)
Eosinophils Relative: 2 %
HCT: 33.8 % — ABNORMAL LOW (ref 39.0–52.0)
Hemoglobin: 11.4 g/dL — ABNORMAL LOW (ref 13.0–17.0)
Immature Granulocytes: 0 %
Lymphocytes Relative: 9 %
Lymphs Abs: 0.3 10*3/uL — ABNORMAL LOW (ref 0.7–4.0)
MCH: 29.9 pg (ref 26.0–34.0)
MCHC: 33.7 g/dL (ref 30.0–36.0)
MCV: 88.7 fL (ref 80.0–100.0)
Monocytes Absolute: 0.7 10*3/uL (ref 0.1–1.0)
Monocytes Relative: 23 %
Neutro Abs: 1.9 10*3/uL (ref 1.7–7.7)
Neutrophils Relative %: 65 %
Platelets: 289 10*3/uL (ref 150–400)
RBC: 3.81 MIL/uL — ABNORMAL LOW (ref 4.22–5.81)
RDW: 14.1 % (ref 11.5–15.5)
WBC: 3 10*3/uL — ABNORMAL LOW (ref 4.0–10.5)
nRBC: 0 % (ref 0.0–0.2)

## 2020-06-21 LAB — TROPONIN I (HIGH SENSITIVITY)
Troponin I (High Sensitivity): 37 ng/L — ABNORMAL HIGH (ref <18)
Troponin I (High Sensitivity): 36 ng/L — ABNORMAL HIGH (ref <18)

## 2020-06-21 LAB — ETHANOL: Alcohol, Ethyl (B): 14 mg/dL — ABNORMAL HIGH (ref <10)

## 2020-06-21 LAB — LIPASE, BLOOD: Lipase: 33 U/L (ref 11–51)

## 2020-06-21 LAB — PHOSPHORUS: Phosphorus: 4.3 mg/dL (ref 2.5–4.6)

## 2020-06-21 LAB — SARS CORONAVIRUS 2 BY RT PCR (HOSPITAL ORDER, PERFORMED IN ~~LOC~~ HOSPITAL LAB): SARS Coronavirus 2: NEGATIVE

## 2020-06-21 LAB — MAGNESIUM: Magnesium: 1.7 mg/dL (ref 1.7–2.4)

## 2020-06-21 IMAGING — DX PORTABLE CHEST - 1 VIEW
1 series · 1 of 1 positions shown · non-contrast
Comparison: June 12, 2019

CLINICAL DATA: Shortness of breath

EXAM:
PORTABLE CHEST 1 VIEW

[chest ap]
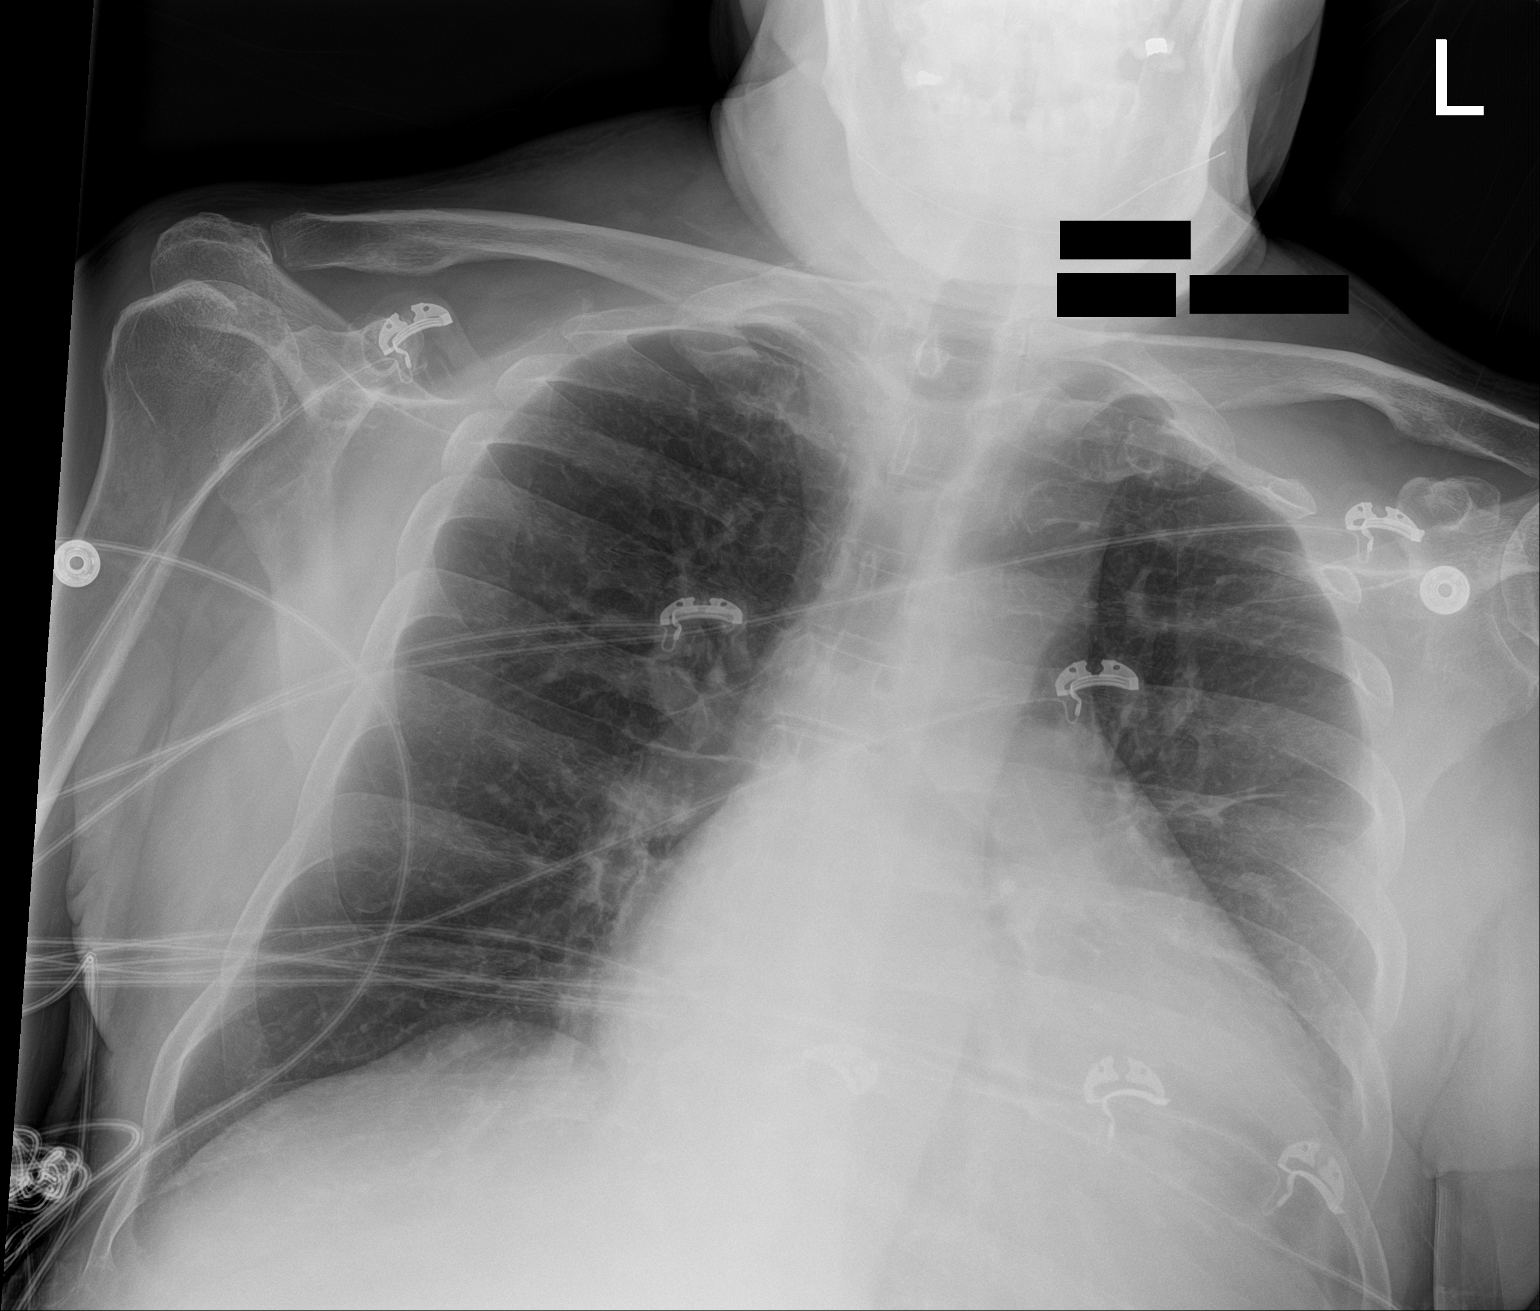

[1 of 1 positions shown; findings below may reference images not displayed]

FINDINGS: There is airspace consolidation in the left lower lobe with small
left pleural effusion. Right lung is clear. Heart is mildly enlarged
with pulmonary vascularity normal, stable. No adenopathy. No bone
lesions.
IMPRESSION: Lower lobe airspace consolidation felt to represent pneumonia. Small
left pleural effusion. Right lung clear. Stable cardiomegaly.

## 2020-06-21 MED ORDER — SODIUM CHLORIDE 0.9 % IV SOLN: 250.0000 mL | INTRAVENOUS | Status: DC | PRN

## 2020-06-21 MED ORDER — FUROSEMIDE 10 MG/ML IJ SOLN
20.0000 mg | Freq: Once | INTRAMUSCULAR | Status: AC
  Administered 2020-06-21: 20 mg via INTRAVENOUS
  Filled 2020-06-21: qty 2

## 2020-06-21 MED ORDER — ESCITALOPRAM OXALATE 10 MG PO TABS
10.0000 mg | ORAL_TABLET | Freq: Every day | ORAL | Status: DC
  Administered 2020-06-21 – 2020-06-24 (×4): 10 mg via ORAL
  Filled 2020-06-21 (×4): qty 1

## 2020-06-21 MED ORDER — IOHEXOL 350 MG/ML SOLN
100.0000 mL | Freq: Once | INTRAVENOUS | Status: AC | PRN
  Administered 2020-06-21: 100 mL via INTRAVENOUS

## 2020-06-21 MED ORDER — MORPHINE SULFATE (PF) 4 MG/ML IV SOLN
4.0000 mg | Freq: Once | INTRAVENOUS | Status: AC
  Administered 2020-06-21: 4 mg via INTRAVENOUS
  Filled 2020-06-21: qty 1

## 2020-06-21 MED ORDER — FUROSEMIDE 10 MG/ML IJ SOLN
60.0000 mg | Freq: Once | INTRAMUSCULAR | Status: AC
  Administered 2020-06-21: 60 mg via INTRAVENOUS
  Filled 2020-06-21: qty 6

## 2020-06-21 MED ORDER — FOLIC ACID 1 MG PO TABS
1.0000 mg | ORAL_TABLET | Freq: Every day | ORAL | Status: DC
  Administered 2020-06-21 – 2020-06-24 (×4): 1 mg via ORAL
  Filled 2020-06-21 (×4): qty 1

## 2020-06-21 MED ORDER — LORAZEPAM 1 MG PO TABS
1.0000 mg | ORAL_TABLET | ORAL | Status: AC | PRN
  Administered 2020-06-22 (×5): 1 mg via ORAL
  Administered 2020-06-24: 2 mg via ORAL
  Filled 2020-06-21 (×4): qty 1
  Filled 2020-06-21: qty 2
  Filled 2020-06-21: qty 1

## 2020-06-21 MED ORDER — SODIUM CHLORIDE 0.9% FLUSH
3.0000 mL | Freq: Two times a day (BID) | INTRAVENOUS | Status: DC
  Administered 2020-06-21 – 2020-06-24 (×6): 3 mL via INTRAVENOUS

## 2020-06-21 MED ORDER — MELATONIN 3 MG PO TABS
3.0000 mg | ORAL_TABLET | Freq: Once | ORAL | Status: AC
  Administered 2020-06-21: 3 mg via ORAL
  Filled 2020-06-21: qty 1

## 2020-06-21 MED ORDER — THIAMINE HCL 100 MG PO TABS
100.0000 mg | ORAL_TABLET | Freq: Every day | ORAL | Status: DC
  Administered 2020-06-21 – 2020-06-24 (×4): 100 mg via ORAL
  Filled 2020-06-21 (×4): qty 1

## 2020-06-21 MED ORDER — TORSEMIDE 20 MG PO TABS: 10.0000 mg | ORAL_TABLET | Freq: Every day | ORAL | Status: DC

## 2020-06-21 MED ORDER — ADULT MULTIVITAMIN W/MINERALS CH
1.0000 | ORAL_TABLET | Freq: Every day | ORAL | Status: DC
  Administered 2020-06-21 – 2020-06-24 (×4): 1 via ORAL
  Filled 2020-06-21 (×4): qty 1

## 2020-06-21 MED ORDER — HYDROCODONE-HOMATROPINE 5-1.5 MG/5ML PO SYRP: 5.0000 mL | ORAL_SOLUTION | Freq: Four times a day (QID) | ORAL | Status: DC | PRN

## 2020-06-21 MED ORDER — NICOTINE 14 MG/24HR TD PT24
14.0000 mg | MEDICATED_PATCH | Freq: Every day | TRANSDERMAL | Status: DC
  Administered 2020-06-22 – 2020-06-24 (×3): 14 mg via TRANSDERMAL
  Filled 2020-06-21 (×4): qty 1

## 2020-06-21 MED ORDER — ASPIRIN EC 81 MG PO TBEC
81.0000 mg | DELAYED_RELEASE_TABLET | Freq: Every day | ORAL | Status: DC
  Administered 2020-06-21 – 2020-06-24 (×4): 81 mg via ORAL
  Filled 2020-06-21 (×4): qty 1

## 2020-06-21 MED ORDER — ISOSORB DINITRATE-HYDRALAZINE 20-37.5 MG PO TABS
0.5000 | ORAL_TABLET | Freq: Three times a day (TID) | ORAL | Status: DC
  Administered 2020-06-21 – 2020-06-24 (×10): 0.5 via ORAL
  Filled 2020-06-21 (×3): qty 1
  Filled 2020-06-21: qty 0.5
  Filled 2020-06-21 (×2): qty 1
  Filled 2020-06-21 (×2): qty 0.5
  Filled 2020-06-21 (×4): qty 1

## 2020-06-21 MED ORDER — ENOXAPARIN SODIUM 40 MG/0.4ML ~~LOC~~ SOLN
40.0000 mg | SUBCUTANEOUS | Status: DC
  Administered 2020-06-21 – 2020-06-24 (×4): 40 mg via SUBCUTANEOUS
  Filled 2020-06-21 (×4): qty 0.4

## 2020-06-21 MED ORDER — LIDOCAINE 5 % EX PTCH
1.0000 | MEDICATED_PATCH | CUTANEOUS | Status: DC
  Administered 2020-06-22 – 2020-06-24 (×3): 1 via TRANSDERMAL
  Filled 2020-06-21 (×4): qty 1

## 2020-06-21 MED ORDER — TRAMADOL HCL 50 MG PO TABS
50.0000 mg | ORAL_TABLET | Freq: Once | ORAL | Status: AC
  Administered 2020-06-21: 50 mg via ORAL
  Filled 2020-06-21: qty 1

## 2020-06-21 MED ORDER — ONDANSETRON HCL 4 MG/2ML IJ SOLN
4.0000 mg | Freq: Four times a day (QID) | INTRAMUSCULAR | Status: DC | PRN
  Administered 2020-06-21: 4 mg via INTRAVENOUS
  Filled 2020-06-21: qty 2

## 2020-06-21 MED ORDER — METHOCARBAMOL 500 MG PO TABS
500.0000 mg | ORAL_TABLET | Freq: Once | ORAL | Status: AC
  Administered 2020-06-21: 500 mg via ORAL
  Filled 2020-06-21: qty 1

## 2020-06-21 MED ORDER — OXYCODONE HCL 5 MG PO TABS
5.0000 mg | ORAL_TABLET | Freq: Four times a day (QID) | ORAL | Status: DC | PRN
  Administered 2020-06-21 – 2020-06-24 (×12): 5 mg via ORAL
  Filled 2020-06-21 (×12): qty 1

## 2020-06-21 MED ORDER — HYDROXYZINE HCL 10 MG PO TABS
10.0000 mg | ORAL_TABLET | Freq: Three times a day (TID) | ORAL | Status: DC | PRN
  Administered 2020-06-21: 10 mg via ORAL
  Filled 2020-06-21 (×2): qty 1

## 2020-06-21 MED ORDER — SODIUM CHLORIDE 1 G PO TABS
2.0000 g | ORAL_TABLET | Freq: Every day | ORAL | Status: DC
  Administered 2020-06-21 – 2020-06-24 (×4): 2 g via ORAL
  Filled 2020-06-21 (×4): qty 2

## 2020-06-21 MED ORDER — ACETAMINOPHEN 325 MG PO TABS
650.0000 mg | ORAL_TABLET | ORAL | Status: DC | PRN
  Filled 2020-06-21: qty 2

## 2020-06-21 MED ORDER — SODIUM CHLORIDE 0.9% FLUSH: 3.0000 mL | INTRAVENOUS | Status: DC | PRN

## 2020-06-21 MED ORDER — FUROSEMIDE 10 MG/ML IJ SOLN
80.0000 mg | Freq: Two times a day (BID) | INTRAMUSCULAR | Status: DC
  Administered 2020-06-21 – 2020-06-22 (×2): 80 mg via INTRAVENOUS
  Filled 2020-06-21 (×2): qty 8

## 2020-06-21 MED ORDER — ONDANSETRON HCL 4 MG/2ML IJ SOLN
4.0000 mg | Freq: Once | INTRAMUSCULAR | Status: AC
  Administered 2020-06-21: 4 mg via INTRAVENOUS
  Filled 2020-06-21: qty 2

## 2020-06-21 MED ORDER — GLIPIZIDE 5 MG PO TABS
2.5000 mg | ORAL_TABLET | Freq: Every day | ORAL | Status: DC
  Administered 2020-06-22: 2.5 mg via ORAL
  Filled 2020-06-21 (×2): qty 1

## 2020-06-21 MED ORDER — ATORVASTATIN CALCIUM 10 MG PO TABS
20.0000 mg | ORAL_TABLET | Freq: Every day | ORAL | Status: DC
  Administered 2020-06-21 – 2020-06-24 (×4): 20 mg via ORAL
  Filled 2020-06-21 (×4): qty 2

## 2020-06-21 MED ORDER — LORAZEPAM 2 MG/ML IJ SOLN
1.0000 mg | INTRAMUSCULAR | Status: AC | PRN
  Administered 2020-06-22: 1 mg via INTRAVENOUS
  Filled 2020-06-21: qty 1

## 2020-06-21 MED ORDER — SPIRONOLACTONE 12.5 MG HALF TABLET
12.5000 mg | ORAL_TABLET | Freq: Every day | ORAL | Status: DC
  Administered 2020-06-21 – 2020-06-22 (×2): 12.5 mg via ORAL
  Filled 2020-06-21 (×3): qty 1

## 2020-06-21 MED ORDER — GABAPENTIN 300 MG PO CAPS
600.0000 mg | ORAL_CAPSULE | Freq: Every day | ORAL | Status: DC
  Administered 2020-06-21 – 2020-06-23 (×3): 600 mg via ORAL
  Filled 2020-06-21 (×3): qty 2
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
## 2020-06-21 NOTE — Progress Notes (Signed)
CSW received consult for patient for substance abuse counseling. CSW reviewed chart, patient has been seen several times by [facility] staff regarding his substance abuse. CSW reached out to Aaron Edelman of [facility] [employer] to request he see this patient.   CSW spoke with [place], LCSW who follows this patient in the Heart Failure Clinic to inform her of the patient's arrival.  Please submit additional consult if needs arise.  Madilyn Fireman, MSW, LCSW-A Transitions of Care  Clinical Social Worker  [facility] Emergency Departments  Medical ICU [phone]

## 2020-06-21 NOTE — ED Triage Notes (Signed)
Patient presents to  ED via [facility] states he woke up approx. 2 hours ago with LUQ and chest pain, rates his pain as 10/10, c/o swelling to his lower ext.
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
## 2020-06-21 NOTE — ED Notes (Signed)
First attempt to call report unsuccessful. 

## 2020-06-22 ENCOUNTER — Other Ambulatory Visit: Payer: Self-pay

## 2020-06-22 DIAGNOSIS — F17210 Nicotine dependence, cigarettes, uncomplicated: Secondary | ICD-10-CM | POA: Diagnosis present

## 2020-06-22 DIAGNOSIS — I5043 Acute on chronic combined systolic (congestive) and diastolic (congestive) heart failure: Secondary | ICD-10-CM | POA: Diagnosis not present

## 2020-06-22 DIAGNOSIS — F1010 Alcohol abuse, uncomplicated: Secondary | ICD-10-CM

## 2020-06-22 DIAGNOSIS — I493 Ventricular premature depolarization: Secondary | ICD-10-CM | POA: Diagnosis not present

## 2020-06-22 DIAGNOSIS — B1920 Unspecified viral hepatitis C without hepatic coma: Secondary | ICD-10-CM | POA: Diagnosis present

## 2020-06-22 DIAGNOSIS — G8929 Other chronic pain: Secondary | ICD-10-CM | POA: Diagnosis present

## 2020-06-22 DIAGNOSIS — R079 Chest pain, unspecified: Secondary | ICD-10-CM | POA: Diagnosis not present

## 2020-06-22 DIAGNOSIS — I5042 Chronic combined systolic (congestive) and diastolic (congestive) heart failure: Secondary | ICD-10-CM

## 2020-06-22 DIAGNOSIS — I083 Combined rheumatic disorders of mitral, aortic and tricuspid valves: Secondary | ICD-10-CM | POA: Diagnosis present

## 2020-06-22 DIAGNOSIS — K7460 Unspecified cirrhosis of liver: Secondary | ICD-10-CM | POA: Diagnosis present

## 2020-06-22 DIAGNOSIS — R296 Repeated falls: Secondary | ICD-10-CM | POA: Diagnosis present

## 2020-06-22 DIAGNOSIS — W19XXXA Unspecified fall, initial encounter: Secondary | ICD-10-CM | POA: Diagnosis present

## 2020-06-22 DIAGNOSIS — I5023 Acute on chronic systolic (congestive) heart failure: Secondary | ICD-10-CM | POA: Diagnosis present

## 2020-06-22 DIAGNOSIS — I130 Hypertensive heart and chronic kidney disease with heart failure and stage 1 through stage 4 chronic kidney disease, or unspecified chronic kidney disease: Secondary | ICD-10-CM | POA: Diagnosis present

## 2020-06-22 DIAGNOSIS — R072 Precordial pain: Secondary | ICD-10-CM | POA: Diagnosis not present

## 2020-06-22 DIAGNOSIS — I4821 Permanent atrial fibrillation: Secondary | ICD-10-CM | POA: Diagnosis present

## 2020-06-22 DIAGNOSIS — E1169 Type 2 diabetes mellitus with other specified complication: Secondary | ICD-10-CM | POA: Diagnosis present

## 2020-06-22 DIAGNOSIS — Z20822 Contact with and (suspected) exposure to covid-19: Secondary | ICD-10-CM | POA: Diagnosis present

## 2020-06-22 DIAGNOSIS — F329 Major depressive disorder, single episode, unspecified: Secondary | ICD-10-CM | POA: Diagnosis present

## 2020-06-22 DIAGNOSIS — E1122 Type 2 diabetes mellitus with diabetic chronic kidney disease: Secondary | ICD-10-CM | POA: Diagnosis present

## 2020-06-22 DIAGNOSIS — I509 Heart failure, unspecified: Secondary | ICD-10-CM | POA: Diagnosis present

## 2020-06-22 DIAGNOSIS — I428 Other cardiomyopathies: Secondary | ICD-10-CM | POA: Diagnosis present

## 2020-06-22 DIAGNOSIS — S0083XA Contusion of other part of head, initial encounter: Secondary | ICD-10-CM | POA: Diagnosis present

## 2020-06-22 DIAGNOSIS — I208 Other forms of angina pectoris: Secondary | ICD-10-CM | POA: Diagnosis not present

## 2020-06-22 DIAGNOSIS — N179 Acute kidney failure, unspecified: Secondary | ICD-10-CM | POA: Diagnosis present

## 2020-06-22 DIAGNOSIS — N1831 Chronic kidney disease, stage 3a: Secondary | ICD-10-CM | POA: Diagnosis present

## 2020-06-22 DIAGNOSIS — I10 Essential (primary) hypertension: Secondary | ICD-10-CM | POA: Diagnosis not present

## 2020-06-22 DIAGNOSIS — K219 Gastro-esophageal reflux disease without esophagitis: Secondary | ICD-10-CM | POA: Diagnosis present

## 2020-06-22 DIAGNOSIS — E1140 Type 2 diabetes mellitus with diabetic neuropathy, unspecified: Secondary | ICD-10-CM | POA: Diagnosis present

## 2020-06-22 DIAGNOSIS — E871 Hypo-osmolality and hyponatremia: Secondary | ICD-10-CM | POA: Diagnosis present

## 2020-06-22 DIAGNOSIS — R55 Syncope and collapse: Secondary | ICD-10-CM | POA: Diagnosis present

## 2020-06-22 DIAGNOSIS — E785 Hyperlipidemia, unspecified: Secondary | ICD-10-CM | POA: Diagnosis present

## 2020-06-22 LAB — BASIC METABOLIC PANEL
Anion gap: 8 (ref 5–15)
BUN: 16 mg/dL (ref 6–20)
CO2: 25 mmol/L (ref 22–32)
Calcium: 8.7 mg/dL — ABNORMAL LOW (ref 8.9–10.3)
Chloride: 97 mmol/L — ABNORMAL LOW (ref 98–111)
Creatinine, Ser: 1.54 mg/dL — ABNORMAL HIGH (ref 0.61–1.24)
GFR calc Af Amer: 56 mL/min — ABNORMAL LOW (ref >60)
GFR calc non Af Amer: 48 mL/min — ABNORMAL LOW (ref >60)
Glucose, Bld: 136 mg/dL — ABNORMAL HIGH (ref 70–99)
Potassium: 4 mmol/L (ref 3.5–5.1)
Sodium: 130 mmol/L — ABNORMAL LOW (ref 135–145)

## 2020-06-22 LAB — GLUCOSE, CAPILLARY
Glucose-Capillary: 91 mg/dL (ref 70–99)
Glucose-Capillary: 155 mg/dL — ABNORMAL HIGH (ref 70–99)

## 2020-06-22 MED ORDER — INSULIN ASPART 100 UNIT/ML ~~LOC~~ SOLN
0.0000 [IU] | Freq: Three times a day (TID) | SUBCUTANEOUS | Status: DC
  Administered 2020-06-23: 1 [IU] via SUBCUTANEOUS
  Administered 2020-06-23 (×2): 2 [IU] via SUBCUTANEOUS
  Administered 2020-06-24: 3 [IU] via SUBCUTANEOUS
  Administered 2020-06-24: 1 [IU] via SUBCUTANEOUS

## 2020-06-22 MED ORDER — METHOCARBAMOL 500 MG PO TABS
500.0000 mg | ORAL_TABLET | Freq: Four times a day (QID) | ORAL | Status: DC | PRN
  Administered 2020-06-22: 500 mg via ORAL
  Administered 2020-06-24: 750 mg via ORAL
  Administered 2020-06-24: 500 mg via ORAL
  Filled 2020-06-22: qty 1
  Filled 2020-06-22: qty 2
  Filled 2020-06-22: qty 1

## 2020-06-22 MED ORDER — INSULIN ASPART 100 UNIT/ML ~~LOC~~ SOLN: 0.0000 [IU] | Freq: Every day | SUBCUTANEOUS | Status: DC

## 2020-06-22 MED ORDER — METHOCARBAMOL 500 MG PO TABS
500.0000 mg | ORAL_TABLET | Freq: Four times a day (QID) | ORAL | Status: DC | PRN
  Administered 2020-06-22: 500 mg via ORAL
  Filled 2020-06-22: qty 1

## 2020-06-22 MED ORDER — MAGNESIUM SULFATE 2 GM/50ML IV SOLN
2.0000 g | Freq: Once | INTRAVENOUS | Status: AC
  Administered 2020-06-22: 2 g via INTRAVENOUS
  Filled 2020-06-22: qty 50

## 2020-06-22 MED ORDER — TRAMADOL HCL 50 MG PO TABS
50.0000 mg | ORAL_TABLET | Freq: Four times a day (QID) | ORAL | Status: DC | PRN
  Administered 2020-06-22 (×2): 50 mg via ORAL
  Filled 2020-06-22 (×3): qty 1

## 2020-06-22 NOTE — Progress Notes (Signed)
Patient ID: Brad [name], male   DOB: Jul 09, 1961, 59 y.o.   MRN: [id number]  PROGRESS NOTE    Brad [name]  [name] DOB: Oct 12, 1961 DOA: 06/21/2020 PCP: Brad Rakes, MD   Brief Narrative:  59 year old man with history of nonischemic cardiomyopathy, chronic kidney disease stage III, chronic hyponatremia, alcohol abuse, hypertension, diabetes mellitus type 2 presented with chest pain and shortness of breath.  CT angiogram was negative for aortic dissection.  He was started on IV Lasix; heart failure team was consulted.  Assessment & Plan:   Acute on chronic, systolic and diastolic CHF -Heart failure team following.  Diuretic management as per heart failure team.  Continue isosorbide hydralazine, spironolactone.  Strict input output.  Daily weights.  Fluid restriction -Not on ARB/ACE inhibitor due to CKD  Chest pain CAD -Had recent left heart catheterization on 6/21 which had shown mild to moderate nonobstructive CAD.  Not on beta-blocker due to bradycardia -Probably from above.  Also could be due to acute costochondritis.  Troponins did not trend up.  CT chest was negative for acute pathology. -Lipase negative. -Continue Lidoderm patch.  Patient states that pain is resolved with morphine.?  Pain medication seeking behavior.  will add tramadol as needed.  Syncope -Probably from above.  Monitor on telemetry: No significant arrhythmias on telemetry other than isolated PVCs.  Might need outpatient monitoring  Chronic hyponatremia -Sodium 130 today.  Fluid restriction.  Continue sodium chloride tablet  CKD stage IIIa -Creatinine stable.  Monitor  Alcohol abuse -Continue CIWA protocol along with thiamine, multivitamin and folic acid  Diabetes mellitus type 2 -Hold glipizide.  CBGs with SSI  Hypertension  -Blood pressure stable.  Continue current antihypertensive regimen  Generalized deconditioning -PT eval   DVT prophylaxis: Lovenox Code Status: Full Family  Communication: Patient at bedside Disposition Plan: Status is: Observation  The patient will require care spanning > 2 midnights and should be moved to inpatient because: Inpatient level of care appropriate due to severity of illness  Dispo: The patient is from: Home              Anticipated d/c is to: Home              Anticipated d/c date is: 1 day              Patient currently is not medically stable to d/c.   Consultants: Heart failure team  Procedures: None  Antimicrobials: None   Subjective: Patient seen and examined at bedside.  Still complains of chest pain and upper abdominal pain.  No overnight fever or vomiting.  Wants something stronger for pain   Objective: Vitals:   06/22/20 0410 06/22/20 0821 06/22/20 0900 06/22/20 1213  BP: 107/79 102/72 133/68 111/84  Pulse: 64 72 83 70  Resp: 19 14    Temp: 98 F (36.7 C) 98 F (36.7 C)  97.9 F (36.6 C)  TempSrc: Oral Oral  Oral  SpO2: 96% 100%  100%  Weight: 77.1 kg     Height:        Intake/Output Summary (Last 24 hours) at 06/22/2020 1349 Last data filed at 06/22/2020 1214 Gross per 24 hour  Intake --  Output 1825 ml  Net -1825 ml   Filed Weights   06/21/20 0649 06/21/20 2021 06/22/20 0410  Weight: 79.4 kg 76.9 kg 77.1 kg    Examination:  General exam: Appears calm and comfortable.  Poor historian. Respiratory system: Bilateral decreased breath sounds at bases. Cardiovascular system: S1 & S2  heard, Rate controlled Gastrointestinal system: Abdomen is nondistended, soft and nontender. Normal bowel sounds heard. Extremities: No cyanosis, clubbing, edema  Central nervous system: Sleepy, wakes up slightly, poor historian. No focal neurological deficits. Moving extremities Skin: No rashes, lesions or ulcers Psychiatry: Slightly anxious    Data Reviewed: I have personally reviewed following labs and imaging studies  CBC: Recent Labs  Lab 06/21/20 0625 06/21/20 1411  WBC 3.0* 3.7*  NEUTROABS 1.9  --    HGB 11.4* 11.6*  HCT 33.8* 34.8*  MCV 88.7 89.0  PLT 289 856   Basic Metabolic Panel: Recent Labs  Lab 06/21/20 0625 06/21/20 1411 06/22/20 0248  NA 127* 128* 130*  K 4.3 4.2 4.0  CL 95* 97* 97*  CO2 21* 20* 25  GLUCOSE 175* 151* 136*  BUN 13 15 16   CREATININE 1.47* 1.43* 1.54*  CALCIUM 8.4* 8.6* 8.7*  MG  --  1.7  --   PHOS  --  4.3  --    GFR: Estimated Creatinine Clearance: 54.3 mL/min (A) (by C-G formula based on SCr of 1.54 mg/dL (H)). Liver Function Tests: Recent Labs  Lab 06/21/20 0625 06/21/20 1411  AST 33 28  ALT 27 27  ALKPHOS 50 47  BILITOT 0.5 0.8  PROT 6.7 6.9  ALBUMIN 3.0* 3.2*   Recent Labs  Lab 06/21/20 0625  LIPASE 33   No results for input(s): AMMONIA in the last 168 hours. Coagulation Profile: No results for input(s): INR, PROTIME in the last 168 hours. Cardiac Enzymes: No results for input(s): CKTOTAL, CKMB, CKMBINDEX, TROPONINI in the last 168 hours. BNP (last 3 results) No results for input(s): PROBNP in the last 8760 hours. HbA1C: No results for input(s): HGBA1C in the last 72 hours. CBG: No results for input(s): GLUCAP in the last 168 hours. Lipid Profile: No results for input(s): CHOL, HDL, LDLCALC, TRIG, CHOLHDL, LDLDIRECT in the last 72 hours. Thyroid Function Tests: No results for input(s): TSH, T4TOTAL, FREET4, T3FREE, THYROIDAB in the last 72 hours. Anemia Panel: No results for input(s): VITAMINB12, FOLATE, FERRITIN, TIBC, IRON, RETICCTPCT in the last 72 hours. Sepsis Labs: No results for input(s): PROCALCITON, LATICACIDVEN in the last 168 hours.  Recent Results (from the past 240 hour(s))  SARS Coronavirus 2 by RT PCR (hospital order, performed in [facility] hospital lab) Nasopharyngeal Nasopharyngeal Swab     Status: None   Collection Time: 06/21/20  6:09 PM   Specimen: Nasopharyngeal Swab  Result Value Ref Range Status   SARS Coronavirus 2 NEGATIVE NEGATIVE Final    Comment: (NOTE) SARS-CoV-2 target nucleic acids  are NOT DETECTED.  The SARS-CoV-2 RNA is generally detectable in upper and lower respiratory specimens during the acute phase of infection. The lowest concentration of SARS-CoV-2 viral copies this assay can detect is 250 copies / mL. A negative result does not preclude SARS-CoV-2 infection and should not be used as the sole basis for treatment or other patient management decisions.  A negative result may occur with improper specimen collection / handling, submission of specimen other than nasopharyngeal swab, presence of viral mutation(s) within the areas targeted by this assay, and inadequate number of viral copies (<250 copies / mL). A negative result must be combined with clinical observations, patient history, and epidemiological information.  Fact Sheet for Patients:   StrictlyIdeas.no  Fact Sheet for Healthcare Providers: BankingDealers.co.za  This test is not yet approved or  cleared by the [place] FDA and has been authorized for detection and/or diagnosis of SARS-CoV-2 by  FDA under an Emergency Use Authorization (EUA).  This EUA will remain in effect (meaning this test can be used) for the duration of the COVID-19 declaration under Section 564(b)(1) of the Act, 21 U.S.C. section 360bbb-3(b)(1), unless the authorization is terminated or revoked sooner.  Performed at [facility], [place] [address], [place], [place] [address]          Radiology Studies: DG Chest Portable 1 View  Result Date: 06/21/2020 CLINICAL DATA:  Chest pain EXAM: PORTABLE CHEST 1 VIEW COMPARISON:  06/10/2020 FINDINGS: Cardiomegaly. There are a few Kerley lines not seen previously. No visible effusion or pneumothorax IMPRESSION: Cardiomegaly and mild interstitial edema. Electronically Signed   By: Monte Fantasia M.D.   On: 06/21/2020 06:49   CT Angio Chest/Abd/Pel for Dissection W and/or Wo Contrast  Result Date: 06/21/2020 CLINICAL DATA:   Abdominal pain with aortic dissection suspected. Sudden onset left lower quadrant pain EXAM: CT ANGIOGRAPHY CHEST, ABDOMEN AND PELVIS TECHNIQUE: Non-contrast CT of the chest was initially obtained. Multidetector CT imaging through the chest, abdomen and pelvis was performed using the standard protocol during bolus administration of intravenous contrast. Multiplanar reconstructed images and MIPs were obtained and reviewed to evaluate the vascular anatomy. CONTRAST:  156mL OMNIPAQUE IOHEXOL 350 MG/ML SOLN COMPARISON:  12/31/2019 abdominal CTA FINDINGS: CTA CHEST FINDINGS Cardiovascular: No intramural hematoma in the aorta on the noncontrast phase. Extensive aortic and coronary atherosclerotic calcification. Preferential opacification of the thoracic aorta. No evidence of thoracic aortic aneurysm or dissection. Enlarged heart size. No pericardial effusion. Mediastinum/Nodes: Negative for adenopathy or mass Lungs/Pleura: Interlobular septal thickening at the bases with trace right pleural effusion. Negative for consolidation. Musculoskeletal: No acute or aggressive finding Review of the MIP images confirms the above findings. CTA ABDOMEN AND PELVIS FINDINGS VASCULAR Aorta: Diffuse atheromatous wall thickening with extensive calcification. No aneurysm or dissection. Celiac: Atheromatous calcification at the ostium. No branch occlusion or beading. SMA: Atheromatous plaque at the ostium. No branch occlusion or beading. Renals: Atherosclerotic plaque at the ostia with mild narrowing on the right. No beading or visible aneurysm. IMA: Patent Inflow: Diffuse atheromatous plaque. Negative for aneurysm or dissection. Veins: Negative in the arterial phase Review of the MIP images confirms the above findings. NON-VASCULAR Hepatobiliary: Cirrhotic liver morphology. No hypervascular mass.No evidence of biliary obstruction or stone. Pancreas: Unremarkable. Spleen: Unremarkable. Adrenals/Urinary Tract: Negative adrenals. No  hydronephrosis or stone. Small interpolar left renal cystic density. Unremarkable bladder. Stomach/Bowel:  No obstruction. No evidence of bowel inflammation. Lymphatic: No mass or adenopathy. Reproductive:No pathologic findings. Other: No ascites or pneumoperitoneum. Musculoskeletal: No acute abnormalities. Review of the MIP images confirms the above findings. IMPRESSION: 1. No evidence of acute aortic syndrome. 2. Cardiomegaly and interstitial edema. 3. Cirrhosis. 4. Aortic Atherosclerosis (ICD10-I70.0). Electronically Signed   By: Monte Fantasia M.D.   On: 06/21/2020 09:53        Scheduled Meds: . aspirin EC  81 mg Oral Daily  . atorvastatin  20 mg Oral q1800  . enoxaparin (LOVENOX) injection  40 mg Subcutaneous Q24H  . escitalopram  10 mg Oral Daily  . folic acid  1 mg Oral Daily  . gabapentin  600 mg Oral QHS  . glipiZIDE  2.5 mg Oral QAC breakfast  . isosorbide-hydrALAZINE  0.5 tablet Oral TID  . lidocaine  1 patch Transdermal Q24H  . multivitamin with minerals  1 tablet Oral Daily  . nicotine  14 mg Transdermal Daily  . sodium chloride flush  3 mL Intravenous Q12H  .  sodium chloride  2 g Oral Daily  . spironolactone  12.5 mg Oral Daily  . thiamine  100 mg Oral Daily   Continuous Infusions: . sodium chloride    . magnesium sulfate bolus IVPB            Aline August, MD [facility] 06/22/2020, 1:49 PM

## 2020-06-22 NOTE — Consult Note (Addendum)
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 prior to ICD implant.  Pecola Haxton M. Bilaal Leib MD 06/22/2020 5:07 PM

## 2020-06-22 NOTE — Evaluation (Signed)
Physical Therapy Evaluation Patient Details Name: Brad [name] MRN: [id number] DOB: Jun 15, 1961 Today's Date: 06/22/2020   History of Present Illness  Pt is a 59 y.o. M with significant PMH of nonischemic cardiomyopathy, CKD stage III, hyponatremia, alcohol abuse, DM2, who presented with chest pain and SOB. Had recent left heart catheterization on 6/21 which had shown mild to moderate nonobstructive CAD. Pt stated pain resolved with morphine. Question pain medication seeking behavior.   Clinical Impression  Patient evaluated by Physical Therapy with no further acute PT needs identified. Pt reporting improvement in chest pain with morphine, although still 8/10. However, pt joking and laughing with PT during session. Prior to admission, pt lives in a level entry apartment with a roommate. He is currently in the process of obtaining new housing. Pt seems fairly close to his functional baseline. Ambulating hallway distances with a walker at a supervision level. Recommended use of assistive device for all mobility (pt has both cane and walker). All education has been completed and the patient has no further questions. See below for any follow-up Physical Therapy or equipment needs. PT is signing off. Thank you for this referral.  Vitals:  Pre mobility: HR 90 bpm, BP 131/89 Post mobility: HR 106 bpm, BP 122/80    Follow Up Recommendations No PT follow up;Supervision for mobility/OOB    Equipment Recommendations  Other (comment) (tub bench)    Recommendations for Other Services       Precautions / Restrictions Precautions Precautions: Fall Restrictions Weight Bearing Restrictions: No      Mobility  Bed Mobility               General bed mobility comments: Sitting EOB upon arrival  Transfers Overall transfer level: Needs assistance Equipment used: None Transfers: Sit to/from Stand Sit to Stand: Supervision         General transfer comment: Supervision for  safety  Ambulation/Gait Ambulation/Gait assistance: Supervision;Min guard Gait Distance (Feet): 380 Feet Assistive device: Rolling walker (2 wheeled);None Gait Pattern/deviations: Step-through pattern;Decreased stride length;Decreased dorsiflexion - right;Decreased dorsiflexion - left Gait velocity: decreased   General Gait Details: Pt requiring min guard initially to steady, progressing to supervision level with use of walker. Noted decreased bilateral heel strike at initial contact, LLE circumduction during swing phase.   Stairs            Wheelchair Mobility    Modified [name] (Stroke Patients Only)       Balance Overall balance assessment: Needs assistance Sitting-balance support: No upper extremity supported;Feet supported Sitting balance-Leahy Scale: Good     Standing balance support: No upper extremity supported;During functional activity Standing balance-Leahy Scale: Poor Standing balance comment: reliant on external support                             Pertinent Vitals/Pain Pain Assessment: 0-10 Faces Pain Scale: Hurts whole lot Pain Location: chest; no visible distress, joking and laughing with PT Pain Descriptors / Indicators: Sore Pain Intervention(s): Monitored during session    Home Living Family/patient expects to be discharged to:: Private residence Living Arrangements: Other (Comment) (roommate) Available Help at Discharge: Friend(s) Type of Home: Apartment Home Access: Level entry     Home Layout: One level Home Equipment: Environmental consultant - 2 wheels;Cane - single point      Prior Function Level of Independence: Independent with assistive device(s)         Comments: use of single point cane  Hand Dominance        Extremity/Trunk Assessment   Upper Extremity Assessment Upper Extremity Assessment: Overall WFL for tasks assessed    Lower Extremity Assessment Lower Extremity Assessment: RLE deficits/detail;LLE  deficits/detail RLE Deficits / Details: Strength 5/5 LLE Deficits / Details: Strength 5/5    Cervical / Trunk Assessment Cervical / Trunk Assessment: Normal  Communication   Communication: No difficulties  Cognition Arousal/Alertness: Awake/alert Behavior During Therapy: WFL for tasks assessed/performed Overall Cognitive Status: Impaired/Different from baseline Area of Impairment: Memory                     Memory: Decreased short-term memory         General Comments: A&Ox 4 (although not oriented to day of week), STM deficits noted and mildly tangential       General Comments      Exercises     Assessment/Plan    PT Assessment Patent does not need any further PT services  PT Problem List Decreased strength;Decreased knowledge of precautions;Decreased mobility;Decreased activity tolerance;Decreased safety awareness       PT Treatment Interventions      PT Goals (Current goals can be found in the Care Plan section)  Acute Rehab PT Goals Patient Stated Goal: get a tub bench, be able to walk across the street PT Goal Formulation: All assessment and education complete, DC therapy    Frequency     Barriers to discharge Decreased caregiver support      Co-evaluation               AM-PAC PT "6 Clicks" Mobility  Outcome Measure Help needed turning from your back to your side while in a flat bed without using bedrails?: None Help needed moving from lying on your back to sitting on the side of a flat bed without using bedrails?: None Help needed moving to and from a bed to a chair (including a wheelchair)?: A Little Help needed standing up from a chair using your arms (e.g., wheelchair or bedside chair)?: None Help needed to walk in hospital room?: A Little Help needed climbing 3-5 steps with a railing? : A Little 6 Click Score: 21    End of Session Equipment Utilized During Treatment: Gait belt Activity Tolerance: Patient tolerated treatment  well Patient left: in bed;with call bell/phone within reach;with bed alarm set Nurse Communication: Mobility status PT Visit Diagnosis: Unsteadiness on feet (R26.81);Difficulty in walking, not elsewhere classified (R26.2)    Time: 4818-5909 PT Time Calculation (min) (ACUTE ONLY): 27 min   Charges:   PT Evaluation $PT Eval Moderate Complexity: 1 Mod PT Treatments $Therapeutic Activity: 8-22 mins          Wyona Almas, PT, DPT Acute Rehabilitation Services Pager [phone] Office [phone]   Deno Etienne 06/22/2020, 2:39 PM

## 2020-06-22 NOTE — Progress Notes (Addendum)
Advanced Heart Failure Rounding Note  PCP-Cardiologist: [name], MD   Subjective:    Chest pain resolved w/ morphine  Feels better, sitting on side of bed eating ice cream.   Volume status improved w/ diuresis, slight bump in SCr from 1.43>>1.54.   Continues w/ PVCs on tele ~12/hr. Error in tele monitoring. Computer reading frequent pauses but after careful review alongside EP, he has had no significant arrhthymias other than isolated PVCs.   Na improving, up from 127>>130.     Objective:   Weight Range: 77.1 kg Body mass index is 23.71 kg/m.   Vital Signs:   Temp:  [97.8 F (36.6 C)-98 F (36.7 C)] 97.9 F (36.6 C) (07/02 1213) Pulse Rate:  [63-89] 70 (07/02 1213) Resp:  [11-25] 14 (07/02 0821) BP: (101-133)/(52-103) 111/84 (07/02 1213) SpO2:  [96 %-100 %] 100 % (07/02 1213) Weight:  [76.9 kg-77.1 kg] 77.1 kg (07/02 0410) Last BM Date: 06/21/20  Weight change: Filed Weights   06/21/20 0649 06/21/20 2021 06/22/20 0410  Weight: 79.4 kg 76.9 kg 77.1 kg    Intake/Output:   Intake/Output Summary (Last 24 hours) at 06/22/2020 1230 Last data filed at 06/22/2020 1214 Gross per 24 hour  Intake --  Output 1825 ml  Net -1825 ml      Physical Exam    General:  Well appearing, sitting up on side of bed eating ice cream. No resp difficulty HEENT: Normal, bruise left upper lip  Neck: Supple. JVP ~ 7 cm . Carotids 2+ bilat; no bruits. No lymphadenopathy or thyromegaly appreciated. Cor: PMI nondisplaced. Irregular rhythm, regular rate. No rubs, gallops or murmurs. Lungs: Clear Abdomen: Soft, nontender, nondistended. No hepatosplenomegaly. No bruits or masses. Good bowel sounds. Extremities: No cyanosis, clubbing, rash, edema Neuro: Alert & orientedx3, cranial nerves grossly intact. moves all 4 extremities w/o difficulty. Affect pleasant   Telemetry   Chronic afib, HR 70s. PVCs ~12/hr   EKG    Chronic afib, 70s, NSVT 3 beats.   Labs    CBC Recent  Labs    06/21/20 0625 06/21/20 1411  WBC 3.0* 3.7*  NEUTROABS 1.9  --   HGB 11.4* 11.6*  HCT 33.8* 34.8*  MCV 88.7 89.0  PLT 289 557   Basic Metabolic Panel Recent Labs    06/21/20 1411 06/22/20 0248  NA 128* 130*  K 4.2 4.0  CL 97* 97*  CO2 20* 25  GLUCOSE 151* 136*  BUN 15 16  CREATININE 1.43* 1.54*  CALCIUM 8.6* 8.7*  MG 1.7  --   PHOS 4.3  --    Liver Function Tests Recent Labs    06/21/20 0625 06/21/20 1411  AST 33 28  ALT 27 27  ALKPHOS 50 47  BILITOT 0.5 0.8  PROT 6.7 6.9  ALBUMIN 3.0* 3.2*   Recent Labs    06/21/20 0625  LIPASE 33   Cardiac Enzymes No results for input(s): CKTOTAL, CKMB, CKMBINDEX, TROPONINI in the last 72 hours.  BNP: BNP (last 3 results) Recent Labs    05/10/20 1225 05/29/20 0430 06/21/20 0626  BNP 745.9* 678.3* 584.3*    ProBNP (last 3 results) No results for input(s): PROBNP in the last 8760 hours.   D-Dimer No results for input(s): DDIMER in the last 72 hours. Hemoglobin A1C No results for input(s): HGBA1C in the last 72 hours. Fasting Lipid Panel No results for input(s): CHOL, HDL, LDLCALC, TRIG, CHOLHDL, LDLDIRECT in the last 72 hours. Thyroid Function Tests No results for input(s):  TSH, T4TOTAL, T3FREE, THYROIDAB in the last 72 hours.  Invalid input(s): FREET3  Other results:   Imaging     No results found.   Medications:     Scheduled Medications: . aspirin EC  81 mg Oral Daily  . atorvastatin  20 mg Oral q1800  . enoxaparin (LOVENOX) injection  40 mg Subcutaneous Q24H  . escitalopram  10 mg Oral Daily  . folic acid  1 mg Oral Daily  . gabapentin  600 mg Oral QHS  . glipiZIDE  2.5 mg Oral QAC breakfast  . isosorbide-hydrALAZINE  0.5 tablet Oral TID  . lidocaine  1 patch Transdermal Q24H  . multivitamin with minerals  1 tablet Oral Daily  . nicotine  14 mg Transdermal Daily  . sodium chloride flush  3 mL Intravenous Q12H  . sodium chloride  2 g Oral Daily  . spironolactone  12.5 mg Oral  Daily  . thiamine  100 mg Oral Daily     Infusions: . sodium chloride       PRN Medications:  sodium chloride, acetaminophen, HYDROcodone-homatropine, hydrOXYzine, LORazepam **OR** LORazepam, methocarbamol, ondansetron (ZOFRAN) IV, oxyCODONE, sodium chloride flush, traMADol   Assessment/Plan   1. Chest Pain - doubt ACS. Had recent [facility] 6/21 showing mild-mod nonobstructive CAD w/ 25% ostial RI, 50% prox-mid LCx and 30% LM.  - Hs troponin trend low level and flat, 37>>36>>36, arguing against acute plaque rupture  - Chest CT negative for acute pathology  - Lipase negative - suspect 2/2 volume overload/ acute on chronic CHF vs musculoskeletal after recent yesterday - Also ? Pain seeking. H/o polysubstance use w/ frequent ED evaluations for CP  - he reports pain resolved w/ morphine  - no indication for further cardiac w/u at this time   2. Syncope - had fall prior to admit w/ LOC. No prodrome  - concern for potential arythmogenic syncope w/ low EF and frequent PVCs - Hs troponin not c/w ACS  - no significant arrhythmias on tele other than isolated PVCs - will order outpatient monitor at d/c for continued surveillance   3. Acute on Chronic Systolic Heart Failure  - volume overloaded on admit BNP 584. CXR and Chest CT w/ intestinal edema   - NYHA Class II-III - Volume improved w/ IV Lasix. Stop IV Lasix today. Resume home torsemide in AM.  - Continue Spiro 25 daily  - Continue Bidil 1/2 tablet TID  - no ARB/ ARNI due to CKD - no ? blocker or dig due to h/o bradycardia   4. CAD - recent [facility] 6/21 showed mild- moderate nonobstructive CAD.  - as outlined above, doubt recent CP is due to coronary ischemia - continue medical therapy w/ ASA + Statin - off ? blocker w/ h/o bradycardia   5. Hyponatremia - Na 120 on admit  - suspect hypervolemic hyponatremia + beer potomania - sodium chloride 2 g daily ordered by primary  - Improved today at 130  - continue to fluid restrict    - needs to reduce ETOH intake    6. ETOH Abuse  - continues to drink beer heavily  - Ethanol level elevated in ED at 14  - CIWA protocol per IM    7. PVCs  - frequent PVCs noted on tele, may be exacerbated by ETOH - ~12/hr  - Has not been on amiodarone /digoxin due to bradycardia.  - Mexiletine added previous admit, will continue 200 mg bid  - K 4.0.  Mg 1.7. given 2 mg MgSO2  x 1  - Keep K >4 and Mg >2  - monitor on tele. Given recent syncope, will d/c home w 30 day Zio patch   8. CKD, Stage III - SCr 1.5 c/w baseline - hold IV Lasix today  - follow BMP daily   9. Chronic Atrial Fibrillation  - rate controlled - not on amiodarone, ? blockers nor digoxin given h/o bradycardia - not on a/c given heavy ETOH use and frequent falls     Length of Stay: 0  Lyda Jester, PA-C  06/22/2020, 12:30 PM  Advanced Heart Failure Team Pager [phone] (M-F; 7a - 4p)  Please contact [facility] for night-coverage after hours (4p -7a ) and weekends on [email or web address]   Patient seen and examined with the above-signed Advanced Practice Provider and/or Housestaff. I personally reviewed laboratory data, imaging studies and relevant notes. I independently examined the patient and formulated the important aspects of the plan. I have edited the note to reflect any of my changes or salient points. I have personally discussed the plan with the patient and/or family.  Volume status looks good. Breathing better. No further CP. Frequent PVCs on tele.   General:  Sitting on side of bed No resp difficulty HEENT: normal ecchymosis near lip Neck: supple. no JVD. Carotids 2+ bilat; no bruits. No lymphadenopathy or thryomegaly appreciated. Cor: PMI nondisplaced. Irregular rate & rhythm. No rubs, gallops or murmurs. Lungs: clear Abdomen: soft, nontender, nondistended. No hepatosplenomegaly. No bruits or masses. Good bowel sounds. Extremities: no cyanosis, clubbing, rash, edema Neuro: alert &  orientedx3, cranial nerves grossly intact. moves all 4 extremities w/o difficulty. Affect pleasant  Volume status looks ok. Can switch back to po diuretics. Case discussed at length with EP and we both feel that he is not an ICD candidate due to ongoing ETOH abuse and lack of compliance with GDMT. Can provide him with Zio patch in Clinic on f/u visit. Continue mexilitene. [place] for d/c from our standpoint.   Total time personally spent 35 minutes. Over half that time spent discussing above.   Glori Bickers, MD  4:24 PM

## 2020-06-23 DIAGNOSIS — I5023 Acute on chronic systolic (congestive) heart failure: Secondary | ICD-10-CM

## 2020-06-23 DIAGNOSIS — I208 Other forms of angina pectoris: Secondary | ICD-10-CM

## 2020-06-23 DIAGNOSIS — N1831 Chronic kidney disease, stage 3a: Secondary | ICD-10-CM

## 2020-06-23 DIAGNOSIS — N179 Acute kidney failure, unspecified: Secondary | ICD-10-CM

## 2020-06-23 LAB — MAGNESIUM: Magnesium: 2.2 mg/dL (ref 1.7–2.4)

## 2020-06-23 LAB — GLUCOSE, CAPILLARY
Glucose-Capillary: 151 mg/dL — ABNORMAL HIGH (ref 70–99)
Glucose-Capillary: 189 mg/dL — ABNORMAL HIGH (ref 70–99)
Glucose-Capillary: 150 mg/dL — ABNORMAL HIGH (ref 70–99)
Glucose-Capillary: 182 mg/dL — ABNORMAL HIGH (ref 70–99)

## 2020-06-23 LAB — BASIC METABOLIC PANEL
Anion gap: 10 (ref 5–15)
BUN: 26 mg/dL — ABNORMAL HIGH (ref 6–20)
CO2: 23 mmol/L (ref 22–32)
Calcium: 8.3 mg/dL — ABNORMAL LOW (ref 8.9–10.3)
Chloride: 94 mmol/L — ABNORMAL LOW (ref 98–111)
Creatinine, Ser: 1.96 mg/dL — ABNORMAL HIGH (ref 0.61–1.24)
GFR calc Af Amer: 42 mL/min — ABNORMAL LOW (ref >60)
GFR calc non Af Amer: 36 mL/min — ABNORMAL LOW (ref >60)
Glucose, Bld: 145 mg/dL — ABNORMAL HIGH (ref 70–99)
Potassium: 4.5 mmol/L (ref 3.5–5.1)
Sodium: 127 mmol/L — ABNORMAL LOW (ref 135–145)

## 2020-06-23 MED ORDER — AMIODARONE HCL 200 MG PO TABS
200.0000 mg | ORAL_TABLET | Freq: Every day | ORAL | Status: DC
  Administered 2020-06-23 – 2020-06-24 (×2): 200 mg via ORAL
  Filled 2020-06-23 (×2): qty 1

## 2020-06-23 NOTE — Progress Notes (Addendum)
Advanced Heart Failure Rounding Note  PCP-Cardiologist: [name], MD   Subjective:    Chest pain resolved w/ morphine  Feels ok. No SOB, orthopnea or PND. Asking for pain meds. Diuretics on hold. Creatinine bumped again.   Seen by EP yesterday. Not candidate for ICD or LifeVest    Na 127  K 1.96  Objective:   Weight Range: 79.7 kg Body mass index is 24.52 kg/m.   Vital Signs:   Temp:  [97.6 F (36.4 C)-98.4 F (36.9 C)] 97.6 F (36.4 C) (07/03 0338) Pulse Rate:  [70-85] 81 (07/03 0338) Resp:  [12-21] 14 (07/03 0338) BP: (102-134)/(68-98) 130/93 (07/03 0338) SpO2:  [97 %-100 %] 98 % (07/03 0338) Weight:  [79.7 kg] 79.7 kg (07/03 0338) Last BM Date: 06/21/20  Weight change: Filed Weights   06/21/20 2021 06/22/20 0410 06/23/20 0338  Weight: 76.9 kg 77.1 kg 79.7 kg    Intake/Output:   Intake/Output Summary (Last 24 hours) at 06/23/2020 0557 Last data filed at 06/22/2020 2233 Gross per 24 hour  Intake 27.59 ml  Output 925 ml  Net -897.41 ml      Physical Exam    General:  Lying in bed . No resp difficulty HEENT: normal Neck: supple. no JVD. Carotids 2+ bilat; no bruits. No lymphadenopathy or thryomegaly appreciated. Cor: PMI nondisplaced. Irregular rate & rhythm. No rubs, gallops or murmurs. Lungs: clear Abdomen: soft, nontender, nondistended. No hepatosplenomegaly. No bruits or masses. Good bowel sounds. Extremities: no cyanosis, clubbing, rash, edema Neuro: alert & orientedx3, cranial nerves grossly intact. moves all 4 extremities w/o difficulty. Affect pleasant   Telemetry   Chronic afib, HR 70s. PVCs ~12/hr   EKG    Chronic afib, 70s, NSVT 3 beats.   Labs    CBC Recent Labs    06/21/20 0625 06/21/20 1411  WBC 3.0* 3.7*  NEUTROABS 1.9  --   HGB 11.4* 11.6*  HCT 33.8* 34.8*  MCV 88.7 89.0  PLT 289 256   Basic Metabolic Panel Recent Labs    06/21/20 1411 06/21/20 1411 06/22/20 0248 06/23/20 0405  NA 128*   < > 130* 127*    K 4.2   < > 4.0 4.5  CL 97*   < > 97* 94*  CO2 20*   < > 25 23  GLUCOSE 151*   < > 136* 145*  BUN 15   < > 16 26*  CREATININE 1.43*   < > 1.54* 1.96*  CALCIUM 8.6*   < > 8.7* 8.3*  MG 1.7  --   --  2.2  PHOS 4.3  --   --   --    < > = values in this interval not displayed.   Liver Function Tests Recent Labs    06/21/20 0625 06/21/20 1411  AST 33 28  ALT 27 27  ALKPHOS 50 47  BILITOT 0.5 0.8  PROT 6.7 6.9  ALBUMIN 3.0* 3.2*   Recent Labs    06/21/20 0625  LIPASE 33   Cardiac Enzymes No results for input(s): CKTOTAL, CKMB, CKMBINDEX, TROPONINI in the last 72 hours.  BNP: BNP (last 3 results) Recent Labs    05/10/20 1225 05/29/20 0430 06/21/20 0626  BNP 745.9* 678.3* 584.3*    ProBNP (last 3 results) No results for input(s): PROBNP in the last 8760 hours.   D-Dimer No results for input(s): DDIMER in the last 72 hours. Hemoglobin A1C No results for input(s): HGBA1C in the last 72 hours. Fasting Lipid  Panel No results for input(s): CHOL, HDL, LDLCALC, TRIG, CHOLHDL, LDLDIRECT in the last 72 hours. Thyroid Function Tests No results for input(s): TSH, T4TOTAL, T3FREE, THYROIDAB in the last 72 hours.  Invalid input(s): FREET3  Other results:   Imaging    No results found.   Medications:     Scheduled Medications: . aspirin EC  81 mg Oral Daily  . atorvastatin  20 mg Oral q1800  . enoxaparin (LOVENOX) injection  40 mg Subcutaneous Q24H  . escitalopram  10 mg Oral Daily  . folic acid  1 mg Oral Daily  . gabapentin  600 mg Oral QHS  . insulin aspart  0-5 Units Subcutaneous QHS  . insulin aspart  0-9 Units Subcutaneous TID WC  . isosorbide-hydrALAZINE  0.5 tablet Oral TID  . lidocaine  1 patch Transdermal Q24H  . multivitamin with minerals  1 tablet Oral Daily  . nicotine  14 mg Transdermal Daily  . sodium chloride flush  3 mL Intravenous Q12H  . sodium chloride  2 g Oral Daily  . spironolactone  12.5 mg Oral Daily  . thiamine  100 mg Oral  Daily    Infusions: . sodium chloride      PRN Medications: sodium chloride, acetaminophen, HYDROcodone-homatropine, hydrOXYzine, LORazepam **OR** LORazepam, methocarbamol, ondansetron (ZOFRAN) IV, oxyCODONE, sodium chloride flush, traMADol   Assessment/Plan   1. Chest Pain - doubt ACS. Had recent [facility] 6/21 showing mild-mod nonobstructive CAD w/ 25% ostial RI, 50% prox-mid LCx and 30% LM.  - Hs troponin trend low level and flat, 37>>36>>36. No ACS - Chest CT negative for acute pathology  - Doubt cardiac  2. Syncope - had fall prior to admit w/ LOC. No prodrome  - concern for potential arythmogenic syncope w/ low EF and frequent PVCs - no significant arrhythmias on tele other than isolated PVCs - EP has seen. Not candidate for ICD or Lifvest - will order outpatient monitor at d/c for continued surveillance   3. Acute on Chronic Systolic Heart Failure  - volume overloaded on admit BNP 584. CXR and Chest CT w/ intestinal edema   - NYHA Class II-III - Volume improved w/ IV Lasix.  - Creatinine up again to 1.9. Continue to hold diuretics - Hold spiro today - Continue Bidil 1/2 tablet TID  - no ARB/ ARNI due to CKD - no ? blocker or dig due to h/o bradycardia   4. CAD - recent [facility] 6/21 showed mild- moderate nonobstructive CAD.  - as outlined above, doubt recent CP is due to coronary ischemia - continue medical therapy w/ ASA + Statin - off ? blocker w/ h/o bradycardia   5. Hyponatremia - Na 120 on admit  - suspect hypervolemic hyponatremia + beer potomania - NA 130-> 127 today - restrict free H2O   6. ETOH Abuse  - continues to drink beer heavily  - Ethanol level elevated in ED at 14  - CIWA protocol per IM    7. PVCs  - frequent PVCs noted on tele, may be exacerbated by ETOH - ~12/hr  - Has not been on amiodarone /digoxin due to bradycardia.  - Mexilitene does not seem to be working, Switch to amio 200 daily - K 4.0.  Mg 1.7. given 2 mg MgSO2 x 1  - Keep K  >4 and Mg >2  - monitor on tele. Given recent syncope, will d/c home w 30 day Zio patch   8. AKI on CKD, Stage III - SCr 1.5 c/w baseline up  to 1.9 today - hold IV Lasix today  - follow BMP daily   9. Chronic Atrial Fibrillation  - rate controlled - not on amiodarone, ? blockers nor digoxin given h/o bradycardia - not on a/c given heavy ETOH use and frequent falls  - will start low-dose amio as above  Hopefully home tomorrow if creatinine plateaus/improves.     Length of Stay: 1  Glori Bickers, MD  06/23/2020, 5:57 AM  Advanced Heart Failure Team Pager [phone] (M-F; 7a - 4p)  Please contact [facility] for night-coverage after hours (4p -7a ) and weekends on [email or web address]

## 2020-06-23 NOTE — Progress Notes (Signed)
Patient ID: Brad [name], male   DOB: 18-May-1961, 59 y.o.   MRN: [id number]  PROGRESS NOTE    Brad [name]  ZHY:[id number] DOB: 04-01-1961 DOA: 06/21/2020 PCP: Charlott Rakes, MD   Brief Narrative:  59 year old man with history of nonischemic cardiomyopathy, chronic kidney disease stage III, chronic hyponatremia, alcohol abuse, hypertension, diabetes mellitus type 2 presented with chest pain and shortness of breath.  CT angiogram was negative for aortic dissection.  He was started on IV Lasix; heart failure team was consulted.  Assessment & Plan:   Acute on chronic, systolic and diastolic CHF -Heart failure team following.  Diuretic management as per heart failure team.  Continue isosorbide hydralazine.  Strict input output.  Daily weights.  Fluid restriction -Not on ARB/ACE inhibitor due to CKD -Hold diuretics today as per heart failure team due to increased creatinine  Chest pain CAD -Had recent left heart catheterization on 6/21 which had shown mild to moderate nonobstructive CAD.  Not on beta-blocker due to bradycardia -Probably from above.  Also could be due to acute costochondritis.  Troponins did not trend up.  CT chest was negative for acute pathology. -Lipase negative. -Continue Lidoderm patch.  Patient states that pain is resolved with morphine.?  Pain medication seeking behavior.  Continue tramadol.  Syncope -Probably from above.  Monitor on telemetry: No significant arrhythmias on telemetry other than isolated PVCs.  Might need outpatient monitoring.  EP evaluation appreciated.  Frequent PVCs -Probably exacerbated by alcohol abuse -Mexiletine switched to oral amiodarone by cardiology.  Continue potassium and magnesium monitoring -Continue telemetry monitoring  Chronic hyponatremia -Sodium 127 today.  Fluid restriction.  Continue sodium chloride tablet  Acute kidney injury on CKD stage IIIa -Creatinine 1.9 today.  Diuretics plan as above.  Monitor  Alcohol  abuse -Continue CIWA protocol along with thiamine, multivitamin and folic acid  Diabetes mellitus type 2 -Hold glipizide.  CBGs with SSI  Hypertension  -Blood pressure stable.  Continue current antihypertensive regimen  Generalized deconditioning -Patient has tolerated PT eval  ?  Pain medication seeking behavior -Patient keeps asking for morphine.  I have clearly told him that he will have to ask his PCP about this.  Will use as needed tramadol for moderate pain and Percocet for severe pain.   DVT prophylaxis: Lovenox Code Status: Full Family Communication: Patient at bedside Disposition Plan: Status is: Inpatient.  Will need continued hospitalization tonight as well because of worsening creatinine.  Probable discharge home tomorrow if cleared by cardiology  Dispo: The patient is from: Home              Anticipated d/c is to: Home              Anticipated d/c date is: 1 day              Patient currently is not medically stable to d/c.   Consultants: Heart failure team  Procedures: None  Antimicrobials: None   Subjective: Patient seen and examined at bedside.  Poor historian.  Still wants morphine for pain.  Still complains of intermittent chest and abdominal pain.  No worsening shortness of breath, fever or vomiting Objective: Vitals:   06/22/20 2033 06/22/20 2300 06/23/20 0307 06/23/20 0338  BP: (!) 121/98 134/84  (!) 130/93  Pulse:   83 81  Resp: 15 (!) 21  14  Temp: 98.4 F (36.9 C) 98.3 F (36.8 C)  97.6 F (36.4 C)  TempSrc: Oral Oral Oral Oral  SpO2:    98%  Weight:    79.7 kg  Height:        Intake/Output Summary (Last 24 hours) at 06/23/2020 0752 Last data filed at 06/22/2020 2233 Gross per 24 hour  Intake 27.59 ml  Output 925 ml  Net -897.41 ml   Filed Weights   06/21/20 2021 06/22/20 0410 06/23/20 0338  Weight: 76.9 kg 77.1 kg 79.7 kg    Examination:  General exam: Poor historian.  No distress. Respiratory system: Bilateral decreased breath  sounds at bases, some scattered crackles Cardiovascular system: Rate controlled, S1-S2 heard Gastrointestinal system: Abdomen is nondistended, soft and nontender.  Bowel sounds are heard  extremities: Trace lower extremity edema.  No clubbing Central nervous system: Extremely poor historian, does not participate in conversation much. No focal neurological deficits. Moving extremities Skin: No obvious lesions or ulcers Psychiatry: Looks anxious    Data Reviewed: I have personally reviewed following labs and imaging studies  CBC: Recent Labs  Lab 06/21/20 0625 06/21/20 1411  WBC 3.0* 3.7*  NEUTROABS 1.9  --   HGB 11.4* 11.6*  HCT 33.8* 34.8*  MCV 88.7 89.0  PLT 289 401   Basic Metabolic Panel: Recent Labs  Lab 06/21/20 0625 06/21/20 1411 06/22/20 0248 06/23/20 0405  NA 127* 128* 130* 127*  K 4.3 4.2 4.0 4.5  CL 95* 97* 97* 94*  CO2 21* 20* 25 23  GLUCOSE 175* 151* 136* 145*  BUN 13 15 16  26*  CREATININE 1.47* 1.43* 1.54* 1.96*  CALCIUM 8.4* 8.6* 8.7* 8.3*  MG  --  1.7  --  2.2  PHOS  --  4.3  --   --    GFR: Estimated Creatinine Clearance: 42.7 mL/min (A) (by C-G formula based on SCr of 1.96 mg/dL (H)). Liver Function Tests: Recent Labs  Lab 06/21/20 0625 06/21/20 1411  AST 33 28  ALT 27 27  ALKPHOS 50 47  BILITOT 0.5 0.8  PROT 6.7 6.9  ALBUMIN 3.0* 3.2*   Recent Labs  Lab 06/21/20 0625  LIPASE 33   No results for input(s): AMMONIA in the last 168 hours. Coagulation Profile: No results for input(s): INR, PROTIME in the last 168 hours. Cardiac Enzymes: No results for input(s): CKTOTAL, CKMB, CKMBINDEX, TROPONINI in the last 168 hours. BNP (last 3 results) No results for input(s): PROBNP in the last 8760 hours. HbA1C: No results for input(s): HGBA1C in the last 72 hours. CBG: Recent Labs  Lab 06/22/20 1618 06/22/20 2228 06/23/20 0624  GLUCAP 91 155* 151*   Lipid Profile: No results for input(s): CHOL, HDL, LDLCALC, TRIG, CHOLHDL, LDLDIRECT in  the last 72 hours. Thyroid Function Tests: No results for input(s): TSH, T4TOTAL, FREET4, T3FREE, THYROIDAB in the last 72 hours. Anemia Panel: No results for input(s): VITAMINB12, FOLATE, FERRITIN, TIBC, IRON, RETICCTPCT in the last 72 hours. Sepsis Labs: No results for input(s): PROCALCITON, LATICACIDVEN in the last 168 hours.  Recent Results (from the past 240 hour(s))  SARS Coronavirus 2 by RT PCR (hospital order, performed in [facility] hospital lab) Nasopharyngeal Nasopharyngeal Swab     Status: None   Collection Time: 06/21/20  6:09 PM   Specimen: Nasopharyngeal Swab  Result Value Ref Range Status   SARS Coronavirus 2 NEGATIVE NEGATIVE Final    Comment: (NOTE) SARS-CoV-2 target nucleic acids are NOT DETECTED.  The SARS-CoV-2 RNA is generally detectable in upper and lower respiratory specimens during the acute phase of infection. The lowest concentration of SARS-CoV-2 viral copies this assay can detect is 250 copies / mL.  A negative result does not preclude SARS-CoV-2 infection and should not be used as the sole basis for treatment or other patient management decisions.  A negative result may occur with improper specimen collection / handling, submission of specimen other than nasopharyngeal swab, presence of viral mutation(s) within the areas targeted by this assay, and inadequate number of viral copies (<250 copies / mL). A negative result must be combined with clinical observations, patient history, and epidemiological information.  Fact Sheet for Patients:   StrictlyIdeas.no  Fact Sheet for Healthcare Providers: BankingDealers.co.za  This test is not yet approved or  cleared by the [place] FDA and has been authorized for detection and/or diagnosis of SARS-CoV-2 by FDA under an Emergency Use Authorization (EUA).  This EUA will remain in effect (meaning this test can be used) for the duration of the COVID-19  declaration under Section 564(b)(1) of the Act, 21 U.S.C. section 360bbb-3(b)(1), unless the authorization is terminated or revoked sooner.  Performed at [facility], [place] [address], [place], [place] [address]          Radiology Studies: CT Angio Chest/Abd/Pel for Dissection W and/or Wo Contrast  Result Date: 06/21/2020 CLINICAL DATA:  Abdominal pain with aortic dissection suspected. Sudden onset left lower quadrant pain EXAM: CT ANGIOGRAPHY CHEST, ABDOMEN AND PELVIS TECHNIQUE: Non-contrast CT of the chest was initially obtained. Multidetector CT imaging through the chest, abdomen and pelvis was performed using the standard protocol during bolus administration of intravenous contrast. Multiplanar reconstructed images and MIPs were obtained and reviewed to evaluate the vascular anatomy. CONTRAST:  138mL OMNIPAQUE IOHEXOL 350 MG/ML SOLN COMPARISON:  12/31/2019 abdominal CTA FINDINGS: CTA CHEST FINDINGS Cardiovascular: No intramural hematoma in the aorta on the noncontrast phase. Extensive aortic and coronary atherosclerotic calcification. Preferential opacification of the thoracic aorta. No evidence of thoracic aortic aneurysm or dissection. Enlarged heart size. No pericardial effusion. Mediastinum/Nodes: Negative for adenopathy or mass Lungs/Pleura: Interlobular septal thickening at the bases with trace right pleural effusion. Negative for consolidation. Musculoskeletal: No acute or aggressive finding Review of the MIP images confirms the above findings. CTA ABDOMEN AND PELVIS FINDINGS VASCULAR Aorta: Diffuse atheromatous wall thickening with extensive calcification. No aneurysm or dissection. Celiac: Atheromatous calcification at the ostium. No branch occlusion or beading. SMA: Atheromatous plaque at the ostium. No branch occlusion or beading. Renals: Atherosclerotic plaque at the ostia with mild narrowing on the right. No beading or visible aneurysm. IMA: Patent Inflow: Diffuse atheromatous  plaque. Negative for aneurysm or dissection. Veins: Negative in the arterial phase Review of the MIP images confirms the above findings. NON-VASCULAR Hepatobiliary: Cirrhotic liver morphology. No hypervascular mass.No evidence of biliary obstruction or stone. Pancreas: Unremarkable. Spleen: Unremarkable. Adrenals/Urinary Tract: Negative adrenals. No hydronephrosis or stone. Small interpolar left renal cystic density. Unremarkable bladder. Stomach/Bowel:  No obstruction. No evidence of bowel inflammation. Lymphatic: No mass or adenopathy. Reproductive:No pathologic findings. Other: No ascites or pneumoperitoneum. Musculoskeletal: No acute abnormalities. Review of the MIP images confirms the above findings. IMPRESSION: 1. No evidence of acute aortic syndrome. 2. Cardiomegaly and interstitial edema. 3. Cirrhosis. 4. Aortic Atherosclerosis (ICD10-I70.0). Electronically Signed   By: Monte Fantasia M.D.   On: 06/21/2020 09:53        Scheduled Meds: . amiodarone  200 mg Oral Daily  . aspirin EC  81 mg Oral Daily  . atorvastatin  20 mg Oral q1800  . enoxaparin (LOVENOX) injection  40 mg Subcutaneous Q24H  . escitalopram  10 mg Oral Daily  . folic acid  1 mg Oral Daily  . gabapentin  600 mg Oral QHS  . insulin aspart  0-5 Units Subcutaneous QHS  . insulin aspart  0-9 Units Subcutaneous TID WC  . isosorbide-hydrALAZINE  0.5 tablet Oral TID  . lidocaine  1 patch Transdermal Q24H  . multivitamin with minerals  1 tablet Oral Daily  . nicotine  14 mg Transdermal Daily  . sodium chloride flush  3 mL Intravenous Q12H  . sodium chloride  2 g Oral Daily  . thiamine  100 mg Oral Daily   Continuous Infusions: . sodium chloride            Aline August, MD [facility] 06/23/2020, 7:52 AM

## 2020-06-24 DIAGNOSIS — R072 Precordial pain: Secondary | ICD-10-CM

## 2020-06-24 DIAGNOSIS — N189 Chronic kidney disease, unspecified: Secondary | ICD-10-CM

## 2020-06-24 LAB — BASIC METABOLIC PANEL
Anion gap: 9 (ref 5–15)
BUN: 28 mg/dL — ABNORMAL HIGH (ref 6–20)
CO2: 23 mmol/L (ref 22–32)
Calcium: 8.8 mg/dL — ABNORMAL LOW (ref 8.9–10.3)
Chloride: 95 mmol/L — ABNORMAL LOW (ref 98–111)
Creatinine, Ser: 1.48 mg/dL — ABNORMAL HIGH (ref 0.61–1.24)
GFR calc Af Amer: 59 mL/min — ABNORMAL LOW (ref >60)
GFR calc non Af Amer: 51 mL/min — ABNORMAL LOW (ref >60)
Glucose, Bld: 153 mg/dL — ABNORMAL HIGH (ref 70–99)
Potassium: 4.5 mmol/L (ref 3.5–5.1)
Sodium: 127 mmol/L — ABNORMAL LOW (ref 135–145)

## 2020-06-24 LAB — GLUCOSE, CAPILLARY
Glucose-Capillary: 157 mg/dL — ABNORMAL HIGH (ref 70–99)
Glucose-Capillary: 226 mg/dL — ABNORMAL HIGH (ref 70–99)
Glucose-Capillary: 126 mg/dL — ABNORMAL HIGH (ref 70–99)

## 2020-06-24 LAB — MAGNESIUM: Magnesium: 2.1 mg/dL (ref 1.7–2.4)

## 2020-06-24 MED ORDER — SPIRONOLACTONE 25 MG PO TABS: 25.0000 mg | ORAL_TABLET | Freq: Every day | ORAL | 0 refills | Status: DC

## 2020-06-24 MED ORDER — TOLVAPTAN 15 MG PO TABS
15.0000 mg | ORAL_TABLET | ORAL | Status: AC
  Administered 2020-06-24: 15 mg via ORAL
  Filled 2020-06-24: qty 1

## 2020-06-24 MED ORDER — AMIODARONE HCL 200 MG PO TABS: 200.0000 mg | ORAL_TABLET | Freq: Every day | ORAL | 0 refills | Status: DC

## 2020-06-24 MED ORDER — METHOCARBAMOL 500 MG PO TABS: 500.0000 mg | ORAL_TABLET | Freq: Four times a day (QID) | ORAL | 0 refills | Status: DC | PRN

## 2020-06-24 MED ORDER — SODIUM CHLORIDE 1 G PO TABS: 2.0000 g | ORAL_TABLET | Freq: Every day | ORAL | 0 refills | Status: DC

## 2020-06-24 MED ORDER — TORSEMIDE 10 MG PO TABS: 20.0000 mg | ORAL_TABLET | Freq: Every day | ORAL | 0 refills | Status: DC

## 2020-06-24 MED ORDER — TRAMADOL HCL 50 MG PO TABS: 50.0000 mg | ORAL_TABLET | Freq: Three times a day (TID) | ORAL | 0 refills | Status: DC | PRN

## 2020-06-24 MED ORDER — LIDOCAINE 5 % EX PTCH: 1.0000 | MEDICATED_PATCH | CUTANEOUS | 0 refills | Status: DC

## 2020-06-24 NOTE — Progress Notes (Signed)
Pt discharged today to home with friend.  Pt's IV removed.  Pt taken off telemetry and CCMD notified.  Pt left with all of their personal belongings and toilet bench.  AVS documentation reviewed with Pt and all questions answered.

## 2020-06-24 NOTE — Progress Notes (Signed)
Advanced Heart Failure Rounding Note  PCP-Cardiologist: [name], MD   Subjective:    Continues to complain about his pain regimen.   Denies SOB, orthopnea or PND.  Creatinine down to 1.48. Sodium stable at 127.  Yesterday started oral amio for PVCs   Objective:   Weight Range: 80.9 kg Body mass index is 24.87 kg/m.   Vital Signs:   Temp:  [97.5 F (36.4 C)-98.4 F (36.9 C)] 98.4 F (36.9 C) (07/04 0401) Pulse Rate:  [75-102] 88 (07/04 0401) Resp:  [16-19] 16 (07/04 0401) BP: (131-147)/(72-98) 138/86 (07/04 0401) SpO2:  [95 %-100 %] 100 % (07/04 0401) Weight:  [80.9 kg] 80.9 kg (07/04 0403) Last BM Date: 06/22/20  Weight change: Filed Weights   06/22/20 0410 06/23/20 0338 06/24/20 0403  Weight: 77.1 kg 79.7 kg 80.9 kg    Intake/Output:   Intake/Output Summary (Last 24 hours) at 06/24/2020 0754 Last data filed at 06/23/2020 0820 Gross per 24 hour  Intake 240 ml  Output --  Net 240 ml      Physical Exam    General:  Sitting up in bed  No resp difficulty HEENT: normal Neck: supple. JVP 8-9 Carotids 2+ bilat; no bruits. No lymphadenopathy or thryomegaly appreciated. Cor: PMI nondisplaced. Irregular rate & rhythm. 2/6 MR Lungs: clear Abdomen: soft, nontender, nondistended. No hepatosplenomegaly. No bruits or masses. Good bowel sounds. Extremities: no cyanosis, clubbing, rash, edema Neuro: alert & orientedx3, cranial nerves grossly intact. moves all 4 extremities w/o difficulty. Affect pleasant   Telemetry   Chronic afib, HR 70-80s. + PVCs Personally reviewed  Labs    CBC Recent Labs    06/21/20 1411  WBC 3.7*  HGB 11.6*  HCT 34.8*  MCV 89.0  PLT 540   Basic Metabolic Panel Recent Labs    06/21/20 1411 06/22/20 0248 06/23/20 0405 06/24/20 0516  NA 128*   < > 127* 127*  K 4.2   < > 4.5 4.5  CL 97*   < > 94* 95*  CO2 20*   < > 23 23  GLUCOSE 151*   < > 145* 153*  BUN 15   < > 26* 28*  CREATININE 1.43*   < > 1.96* 1.48*   CALCIUM 8.6*   < > 8.3* 8.8*  MG 1.7  --  2.2 2.1  PHOS 4.3  --   --   --    < > = values in this interval not displayed.   Liver Function Tests Recent Labs    06/21/20 1411  AST 28  ALT 27  ALKPHOS 47  BILITOT 0.8  PROT 6.9  ALBUMIN 3.2*   No results for input(s): LIPASE, AMYLASE in the last 72 hours. Cardiac Enzymes No results for input(s): CKTOTAL, CKMB, CKMBINDEX, TROPONINI in the last 72 hours.  BNP: BNP (last 3 results) Recent Labs    05/10/20 1225 05/29/20 0430 06/21/20 0626  BNP 745.9* 678.3* 584.3*    ProBNP (last 3 results) No results for input(s): PROBNP in the last 8760 hours.   D-Dimer No results for input(s): DDIMER in the last 72 hours. Hemoglobin A1C No results for input(s): HGBA1C in the last 72 hours. Fasting Lipid Panel No results for input(s): CHOL, HDL, LDLCALC, TRIG, CHOLHDL, LDLDIRECT in the last 72 hours. Thyroid Function Tests No results for input(s): TSH, T4TOTAL, T3FREE, THYROIDAB in the last 72 hours.  Invalid input(s): FREET3  Other results:   Imaging    No results found.   Medications:  Scheduled Medications: . amiodarone  200 mg Oral Daily  . aspirin EC  81 mg Oral Daily  . atorvastatin  20 mg Oral q1800  . enoxaparin (LOVENOX) injection  40 mg Subcutaneous Q24H  . escitalopram  10 mg Oral Daily  . folic acid  1 mg Oral Daily  . gabapentin  600 mg Oral QHS  . insulin aspart  0-5 Units Subcutaneous QHS  . insulin aspart  0-9 Units Subcutaneous TID WC  . isosorbide-hydrALAZINE  0.5 tablet Oral TID  . lidocaine  1 patch Transdermal Q24H  . multivitamin with minerals  1 tablet Oral Daily  . nicotine  14 mg Transdermal Daily  . sodium chloride flush  3 mL Intravenous Q12H  . sodium chloride  2 g Oral Daily  . thiamine  100 mg Oral Daily    Infusions: . sodium chloride      PRN Medications: sodium chloride, acetaminophen, HYDROcodone-homatropine, hydrOXYzine, LORazepam **OR** LORazepam, methocarbamol,  ondansetron (ZOFRAN) IV, oxyCODONE, sodium chloride flush, traMADol   Assessment/Plan   1. Chest Pain - doubt ACS. Had recent [facility] 6/21 showing mild-mod nonobstructive CAD w/ 25% ostial RI, 50% prox-mid LCx and 30% LM.  - Hs troponin trend low level and flat, 37>>36>>36. No ACS - Chest CT negative for acute pathology  - This is not cardiac  2. Syncope - had fall prior to admit w/ LOC. No prodrome  - concern for potential arythmogenic syncope w/ low EF and frequent PVCs vs ETOH intoxication/orthostasis - EP has seen. Not candidate for ICD or Lifevest - Rhythm stable here except for PVCs - can order outpatient monitor in clinic for ongoing surveillance as needed  3. Acute on Chronic Systolic Heart Failure  - volume overloaded on admit BNP 584. CXR and Chest CT w/ intestinal edema   - NYHA Class II-III - Volume improved w/ IV Lasix.  - Creatinine improved   4. CAD - recent [facility] 6/21 showed mild- moderate nonobstructive CAD.  - as outlined above, doubt recent CP is due to coronary ischemia - continue medical therapy w/ ASA + Statin - off ? blocker w/ h/o bradycardia   5. Hyponatremia - Na 120 on admit  - suspect hypervolemic hyponatremia + beer potomania - NA stable at 127 today. I gave one dose tolvaptan this am - restrict free H2O and stop beer intake   6. ETOH Abuse  - continues to drink beer heavily  - Ethanol level elevated in ED at 14  - CIWA protocol per IM    7. PVCs  - frequent PVCs noted on tele, may be exacerbated by ETOH - ~12/hr  - Mexilitene does not seem to be working, Publishing rights manager to [employer] 200 daily - K 4.0.  Mg 1.7. given 2 mg MgSO2 x 1  - Keep K >4 and Mg >2    8. AKI on CKD, Stage III - back to baseline today  9. Chronic Atrial Fibrillation  - rate controlled - not on amiodarone, ? blockers nor digoxin given h/o bradycardia - not on a/c given heavy ETOH use and frequent falls  - will start low-dose amio as above  Can go home today from HF  perspective on   ASA 81 Atorva 20 Bidil 0.5 tab tid Arlyce Harman 25 Torsemide 20 daily (was on 10 on admit and 60 daily earlier in month) Amio 200 daily  Stop mexilitene No ETOH intake.  Limit free water    Length of Stay: 2  Glori Bickers, MD  06/24/2020, 7:54 AM  Advanced Heart Failure Team Pager [phone] (M-F; [place])  Please contact [facility] for night-coverage after hours (4p -7a ) and weekends on [email or web address]

## 2020-06-24 NOTE — Discharge Summary (Signed)
Physician Discharge Summary  Brad [name] JIR:[id number] DOB: 28-Sep-1961 DOA: 06/21/2020  PCP: Charlott Rakes, MD  Admit date: 06/21/2020 Discharge date: 06/24/2020  Admitted From: Home Disposition: Home  Recommendations for Outpatient Follow-up:  1. Follow up with PCP in 1 week with repeat CBC/BMP  2. Outpatient follow-up with heart failure team/cardiology 3. Comply with medications and follow-up 4. Follow up in ED if symptoms worsen or new appear   Home Health: No Equipment/Devices: None  Discharge Condition: Stable CODE STATUS: Full Diet recommendation: Heart healthy/no alcohol intake/limit free water/fluid restriction up to 1200 cc a day/2 g sodium diet  Brief/Interim Summary: 59 year old man with history of nonischemic cardiomyopathy, chronic kidney disease stage III, chronic hyponatremia, alcohol abuse, hypertension, diabetes mellitus type 2 presented with chest pain and shortness of breath.  CT angiogram was negative for aortic dissection.  He was started on IV Lasix; heart failure team was consulted.  During the hospitalization, diuretics were held on 06/23/2020 by heart failure team because of increasing renal function.  Heart failure team has cleared the patient for discharge today.  He will need to be compliant with medications, follow-up and will need to avoid alcohol and limit his free water intake.  He will be discharged home today.  Discharge Diagnoses:   Acute on chronic, systolic and diastolic CHF -Heart failure team following.  Initially treated with diuretics which were held on 06/23/2020 because of increasing creatinine.  -He will need to be compliant with medications, follow-up and will need to avoid alcohol and limit his free water intake.  Fluid restriction of up to 1200 cc a day. -Not on ARB/ACE inhibitor due to CKD - Heart failure team has cleared the patient for discharge today on BiDil 0.5 tabs 3 times daily, spironolactone 25 mg daily, torsemide 20 mg daily.   Outpatient follow-up with heart failure team.  Chest pain CAD -Had recent left heart catheterization on 6/21 which had shown mild to moderate nonobstructive CAD.  Not on beta-blocker due to bradycardia -Probably from above.  Also could be due to acute costochondritis.  Troponins did not trend up.  CT chest was negative for acute pathology. -Lipase negative. -Continue Lidoderm patch.  Patient states that pain is resolved with morphine.?  Pain medication seeking behavior.  Continue tramadol on discharge.  Outpatient follow-up with PCP -Continue aspirin 81 mg daily and atorvastatin 20 mg nightly  Syncope -Probably from above.  Monitor on telemetry: No significant arrhythmias on telemetry other than isolated PVCs.  Might need outpatient monitoring.  This will be arranged by cardiology follow-up in the office. EP evaluation appreciated.  Frequent PVCs -Probably exacerbated by alcohol abuse -Mexiletine switched to oral amiodarone by cardiology.   -Cardiology is recommending amiodarone 200 mg daily on discharge.  Outpatient follow-up  Chronic hyponatremia -Sodium 127 today.    Patient received a dose of tolvaptan per heart failure team today.  Continue sodium chloride tablet.  Limit free water intake.  Acute kidney injury on CKD stage IIIa -Creatinine 1.48 today.  Diuretics plan as above.    Outpatient follow-up  Alcohol abuse -Treated with CIWA protocol.  Continue with thiamine, multivitamin and folic acid on discharge.  Will need to abstain from alcohol  Diabetes mellitus type 2 -Resume home regimen.  Carb modified diet.  Outpatient follow-up  Hypertension  -Antihypertensive plan as above  Generalized deconditioning -Patient has tolerated PT eval  ?  Pain medication seeking behavior -Patient keeps asking for morphine.  I have clearly told him that he will have to  ask his PCP about this.  Will discharge on few days of tramadol as needed along with Lidoderm patch and Robaxin    discharge Instructions  Discharge Instructions    AMB referral to CHF clinic   Complete by: As directed    Diet heart healthy/carb modified   Complete by: As directed    Increase activity slowly   Complete by: As directed      Allergies as of 06/24/2020      Reactions   Angiotensin Receptor Blockers Anaphylaxis, Other (See Comments)   (Angioedema also with Lisinopril, therefore ARB's are contraindicated)   Lisinopril Anaphylaxis, Swelling   Throat swelling   Pamelor [nortriptyline Hcl] Anaphylaxis, Swelling   Throat swells      Medication List    STOP taking these medications   mexiletine 200 MG capsule Commonly known as: MEXITIL   nicotine 14 mg/24hr patch Commonly known as: NICODERM CQ - dosed in mg/24 hours     TAKE these medications   amiodarone 200 MG tablet Commonly known as: PACERONE Take 1 tablet (200 mg total) by mouth daily.   aspirin 81 MG EC tablet Take 1 tablet (81 mg total) by mouth daily. Swallow whole.   atorvastatin 20 MG tablet Commonly known as: LIPITOR Take 1 tablet (20 mg total) by mouth daily at 6 PM.   blood glucose meter kit and supplies Kit Dispense based on patient and insurance preference. Use up to four times daily as directed. (FOR ICD-9 250.00, 250.01).   escitalopram 10 MG tablet Commonly known as: LEXAPRO Take 1 tablet (10 mg total) by mouth daily.   folic acid 1 MG tablet Commonly known as: FOLVITE Take 1 tablet (1 mg total) by mouth daily.   gabapentin 300 MG capsule Commonly known as: NEURONTIN Take 2 capsules (600 mg total) by mouth at bedtime.   glipiZIDE 5 MG tablet Commonly known as: GLUCOTROL Take 0.5 tablets (2.5 mg total) by mouth daily before breakfast.   HYDROcodone-homatropine 5-1.5 MG/5ML syrup Commonly known as: Hycodan Take 5 mLs by mouth every 6 (six) hours as needed for cough.   hydrOXYzine 10 MG tablet Commonly known as: ATARAX/VISTARIL Take 1 tablet (10 mg total) by mouth 3 (three) times daily as  needed for up to 20 doses for anxiety.   isosorbide-hydrALAZINE 20-37.5 MG tablet Commonly known as: BIDIL Take 0.5 tablets by mouth 3 (three) times daily.   lidocaine 5 % Commonly known as: LIDODERM Place 1 patch onto the skin daily. Remove & Discard patch within 12 hours or as directed by MD   methocarbamol 500 MG tablet Commonly known as: ROBAXIN Take 1 tablet (500 mg total) by mouth every 6 (six) hours as needed for muscle spasms.   multivitamin with minerals Tabs tablet Take 1 tablet by mouth daily.   sodium chloride 1 g tablet Take 2 tablets (2 g total) by mouth daily.   spironolactone 25 MG tablet Commonly known as: ALDACTONE Take 1 tablet (25 mg total) by mouth daily. What changed: how much to take   thiamine 100 MG tablet Take 1 tablet (100 mg total) by mouth daily.   torsemide 10 MG tablet Commonly known as: DEMADEX Take 2 tablets (20 mg total) by mouth daily. What changed: how much to take   traMADol 50 MG tablet Commonly known as: ULTRAM Take 1 tablet (50 mg total) by mouth every 8 (eight) hours as needed for moderate pain.            Durable Medical Equipment  (  From admission, onward)         Start     Ordered   06/22/20 1352  For home use only DME Tub bench  Once        06/22/20 1351          Allergies  Allergen Reactions  . Angiotensin Receptor Blockers Anaphylaxis and Other (See Comments)    (Angioedema also with Lisinopril, therefore ARB's are contraindicated)  . Lisinopril Anaphylaxis and Swelling    Throat swelling  . Pamelor [Nortriptyline Hcl] Anaphylaxis and Swelling    Throat swells    Consultations:  Heart failure team/EP   Procedures/Studies: DG Chest 2 View  Result Date: 06/10/2020 CLINICAL DATA:  Central nonradiating chest pain. EXAM: CHEST - 2 VIEW COMPARISON:  Radiograph 05/29/2020. FINDINGS: Chronic cardiomegaly with unchanged mediastinal contours. Aortic atherosclerosis. No significant pleural effusion. Mild  vascular congestion without pulmonary edema. No focal airspace disease. No pneumothorax. No acute osseous abnormalities are seen. IMPRESSION: Chronic cardiomegaly with mild vascular congestion. Electronically Signed   By: Keith Rake M.D.   On: 06/10/2020 00:18   DG Chest 2 View  Result Date: 05/29/2020 CLINICAL DATA:  Chest pain, CHF, AFib EXAM: CHEST - 2 VIEW COMPARISON:  Radiograph 04/18/2020 FINDINGS: Some increasing hazy interstitial opacities with redistributed pulmonary vascularity and central peribronchovascular cuffing as well as some mild fissural and septal thickening. No focal consolidation. No pneumothorax or visible effusion. Borderline cardiomegaly. The aorta is calcified. The remaining cardiomediastinal contours are unremarkable. No acute osseous or soft tissue abnormality. IMPRESSION: Findings suggestive of mild CHF/volume overload with borderline cardiomegaly and interstitial edema. Electronically Signed   By: Lovena Le M.D.   On: 05/29/2020 03:26   CARDIAC CATHETERIZATION  Result Date: 06/04/2020  Ost Ramus to Ramus lesion is 25% stenosed.  Prox Cx to Mid Cx lesion is 50% stenosed.  Ost LM lesion is 30% stenosed.  Findings: Ao = 119/68 (88) LV = 116/12 RA = 13 RV = 53/12 PA = 54/20 (33) PCW = 22 (v = 35) Fick cardiac output/index = 4.7/2.3 PVR = 2.2 WU Ao sat = 97% PA sat = 57%, 57% Assessment: 1. Mild non-obstructive CAD with unusual origin and course of RCA 2. NICM EF 20% 3. Mild to moderately elevated biventricular filling pressures 4. Mildly reduced CO Plan/Discussion: Continue medical therapy including PVC suppression and ETOH cessation. Glori Bickers, MD 9:28 AM   DG Chest Portable 1 View  Result Date: 06/21/2020 CLINICAL DATA:  Chest pain EXAM: PORTABLE CHEST 1 VIEW COMPARISON:  06/10/2020 FINDINGS: Cardiomegaly. There are a few Kerley lines not seen previously. No visible effusion or pneumothorax IMPRESSION: Cardiomegaly and mild interstitial edema. Electronically  Signed   By: Monte Fantasia M.D.   On: 06/21/2020 06:49   CT Angio Chest/Abd/Pel for Dissection W and/or Wo Contrast  Result Date: 06/21/2020 CLINICAL DATA:  Abdominal pain with aortic dissection suspected. Sudden onset left lower quadrant pain EXAM: CT ANGIOGRAPHY CHEST, ABDOMEN AND PELVIS TECHNIQUE: Non-contrast CT of the chest was initially obtained. Multidetector CT imaging through the chest, abdomen and pelvis was performed using the standard protocol during bolus administration of intravenous contrast. Multiplanar reconstructed images and MIPs were obtained and reviewed to evaluate the vascular anatomy. CONTRAST:  152m OMNIPAQUE IOHEXOL 350 MG/ML SOLN COMPARISON:  12/31/2019 abdominal CTA FINDINGS: CTA CHEST FINDINGS Cardiovascular: No intramural hematoma in the aorta on the noncontrast phase. Extensive aortic and coronary atherosclerotic calcification. Preferential opacification of the thoracic aorta. No evidence of thoracic aortic aneurysm or dissection. Enlarged  heart size. No pericardial effusion. Mediastinum/Nodes: Negative for adenopathy or mass Lungs/Pleura: Interlobular septal thickening at the bases with trace right pleural effusion. Negative for consolidation. Musculoskeletal: No acute or aggressive finding Review of the MIP images confirms the above findings. CTA ABDOMEN AND PELVIS FINDINGS VASCULAR Aorta: Diffuse atheromatous wall thickening with extensive calcification. No aneurysm or dissection. Celiac: Atheromatous calcification at the ostium. No branch occlusion or beading. SMA: Atheromatous plaque at the ostium. No branch occlusion or beading. Renals: Atherosclerotic plaque at the ostia with mild narrowing on the right. No beading or visible aneurysm. IMA: Patent Inflow: Diffuse atheromatous plaque. Negative for aneurysm or dissection. Veins: Negative in the arterial phase Review of the MIP images confirms the above findings. NON-VASCULAR Hepatobiliary: Cirrhotic liver morphology. No  hypervascular mass.No evidence of biliary obstruction or stone. Pancreas: Unremarkable. Spleen: Unremarkable. Adrenals/Urinary Tract: Negative adrenals. No hydronephrosis or stone. Small interpolar left renal cystic density. Unremarkable bladder. Stomach/Bowel:  No obstruction. No evidence of bowel inflammation. Lymphatic: No mass or adenopathy. Reproductive:No pathologic findings. Other: No ascites or pneumoperitoneum. Musculoskeletal: No acute abnormalities. Review of the MIP images confirms the above findings. IMPRESSION: 1. No evidence of acute aortic syndrome. 2. Cardiomegaly and interstitial edema. 3. Cirrhosis. 4. Aortic Atherosclerosis (ICD10-I70.0). Electronically Signed   By: Monte Fantasia M.D.   On: 06/21/2020 09:53       Subjective: Patient seen and examined at bedside.  He is sleepy, wakes up slightly on calling his name, poor historian.  No overnight fever, vomiting reported.  Discharge Exam: Vitals:   06/24/20 0401 06/24/20 0950  BP: 138/86 (!) 135/93  Pulse: 88 79  Resp: 16 16  Temp: 98.4 F (36.9 C) 98.8 F (37.1 C)  SpO2: 100% 99%    General: Pt is sleepy, wakes up slightly on calling his name.  Poor historian cardiovascular: rate controlled, S1/S2 + Respiratory: bilateral decreased breath sounds at bases with some scattered crackles Abdominal: Soft, NT, ND, bowel sounds + Extremities: Trace lower extremity edema; no cyanosis    The results of significant diagnostics from this hospitalization (including imaging, microbiology, ancillary and laboratory) are listed below for reference.     Microbiology: Recent Results (from the past 240 hour(s))  SARS Coronavirus 2 by RT PCR (hospital order, performed in [facility] hospital lab) Nasopharyngeal Nasopharyngeal Swab     Status: None   Collection Time: 06/21/20  6:09 PM   Specimen: Nasopharyngeal Swab  Result Value Ref Range Status   SARS Coronavirus 2 NEGATIVE NEGATIVE Final    Comment: (NOTE) SARS-CoV-2 target  nucleic acids are NOT DETECTED.  The SARS-CoV-2 RNA is generally detectable in upper and lower respiratory specimens during the acute phase of infection. The lowest concentration of SARS-CoV-2 viral copies this assay can detect is 250 copies / mL. A negative result does not preclude SARS-CoV-2 infection and should not be used as the sole basis for treatment or other patient management decisions.  A negative result may occur with improper specimen collection / handling, submission of specimen other than nasopharyngeal swab, presence of viral mutation(s) within the areas targeted by this assay, and inadequate number of viral copies (<250 copies / mL). A negative result must be combined with clinical observations, patient history, and epidemiological information.  Fact Sheet for Patients:   StrictlyIdeas.no  Fact Sheet for Healthcare Providers: BankingDealers.co.za  This test is not yet approved or  cleared by the [place] FDA and has been authorized for detection and/or diagnosis of SARS-CoV-2 by FDA under an Emergency Use  Authorization (EUA).  This EUA will remain in effect (meaning this test can be used) for the duration of the COVID-19 declaration under Section 564(b)(1) of the Act, 21 U.S.C. section 360bbb-3(b)(1), unless the authorization is terminated or revoked sooner.  Performed at [facility], [place] [address], [place], [place] [address]      Labs: BNP (last 3 results) Recent Labs    05/10/20 1225 05/29/20 0430 06/21/20 0626  BNP 745.9* 678.3* 993.7*   Basic Metabolic Panel: Recent Labs  Lab 06/21/20 0625 06/21/20 1411 06/22/20 0248 06/23/20 0405 06/24/20 0516  NA 127* 128* 130* 127* 127*  K 4.3 4.2 4.0 4.5 4.5  CL 95* 97* 97* 94* 95*  CO2 21* 20* '25 23 23  ' GLUCOSE 175* 151* 136* 145* 153*  BUN '13 15 16 ' 26* 28*  CREATININE 1.47* 1.43* 1.54* 1.96* 1.48*  CALCIUM 8.4* 8.6* 8.7* 8.3* 8.8*  MG  --   1.7  --  2.2 2.1  PHOS  --  4.3  --   --   --    Liver Function Tests: Recent Labs  Lab 06/21/20 0625 06/21/20 1411  AST 33 28  ALT 27 27  ALKPHOS 50 47  BILITOT 0.5 0.8  PROT 6.7 6.9  ALBUMIN 3.0* 3.2*   Recent Labs  Lab 06/21/20 0625  LIPASE 33   No results for input(s): AMMONIA in the last 168 hours. CBC: Recent Labs  Lab 06/21/20 0625 06/21/20 1411  WBC 3.0* 3.7*  NEUTROABS 1.9  --   HGB 11.4* 11.6*  HCT 33.8* 34.8*  MCV 88.7 89.0  PLT 289 268   Cardiac Enzymes: No results for input(s): CKTOTAL, CKMB, CKMBINDEX, TROPONINI in the last 168 hours. BNP: Invalid input(s): POCBNP CBG: Recent Labs  Lab 06/23/20 0624 06/23/20 1119 06/23/20 1628 06/23/20 2108 06/24/20 0606  GLUCAP 151* 189* 150* 182* 157*   D-Dimer No results for input(s): DDIMER in the last 72 hours. Hgb A1c No results for input(s): HGBA1C in the last 72 hours. Lipid Profile No results for input(s): CHOL, HDL, LDLCALC, TRIG, CHOLHDL, LDLDIRECT in the last 72 hours. Thyroid function studies No results for input(s): TSH, T4TOTAL, T3FREE, THYROIDAB in the last 72 hours.  Invalid input(s): FREET3 Anemia work up No results for input(s): VITAMINB12, FOLATE, FERRITIN, TIBC, IRON, RETICCTPCT in the last 72 hours. Urinalysis    Component Value Date/Time   COLORURINE STRAW (A) 03/30/2020 1928   APPEARANCEUR CLEAR 03/30/2020 1928   LABSPEC 1.002 (L) 03/30/2020 1928   PHURINE 6.0 03/30/2020 1928   GLUCOSEU NEGATIVE 03/30/2020 1928   HGBUR NEGATIVE 03/30/2020 1928   BILIRUBINUR NEGATIVE 03/30/2020 1928   BILIRUBINUR neg 05/08/2017 1503   KETONESUR NEGATIVE 03/30/2020 1928   PROTEINUR 30 (A) 03/30/2020 1928   UROBILINOGEN 0.2 05/08/2017 1503   UROBILINOGEN 1.0 04/26/2012 1328   NITRITE NEGATIVE 03/30/2020 1928   LEUKOCYTESUR NEGATIVE 03/30/2020 1928   Sepsis Labs Invalid input(s): PROCALCITONIN,  WBC,  LACTICIDVEN Microbiology Recent Results (from the past 240 hour(s))  SARS Coronavirus  2 by RT PCR (hospital order, performed in [facility] hospital lab) Nasopharyngeal Nasopharyngeal Swab     Status: None   Collection Time: 06/21/20  6:09 PM   Specimen: Nasopharyngeal Swab  Result Value Ref Range Status   SARS Coronavirus 2 NEGATIVE NEGATIVE Final    Comment: (NOTE) SARS-CoV-2 target nucleic acids are NOT DETECTED.  The SARS-CoV-2 RNA is generally detectable in upper and lower respiratory specimens during the acute phase of infection. The lowest concentration of  SARS-CoV-2 viral copies this assay can detect is 250 copies / mL. A negative result does not preclude SARS-CoV-2 infection and should not be used as the sole basis for treatment or other patient management decisions.  A negative result may occur with improper specimen collection / handling, submission of specimen other than nasopharyngeal swab, presence of viral mutation(s) within the areas targeted by this assay, and inadequate number of viral copies (<250 copies / mL). A negative result must be combined with clinical observations, patient history, and epidemiological information.  Fact Sheet for Patients:   StrictlyIdeas.no  Fact Sheet for Healthcare Providers: BankingDealers.co.za  This test is not yet approved or  cleared by the [place] FDA and has been authorized for detection and/or diagnosis of SARS-CoV-2 by FDA under an Emergency Use Authorization (EUA).  This EUA will remain in effect (meaning this test can be used) for the duration of the COVID-19 declaration under Section 564(b)(1) of the Act, 21 U.S.C. section 360bbb-3(b)(1), unless the authorization is terminated or revoked sooner.  Performed at [facility], [place] [address] [place], [place] [address]      Time coordinating discharge: 35 minutes  SIGNED:   Aline August, MD  [facility] 06/24/2020, 11:04 AM

## 2020-06-25 ENCOUNTER — Other Ambulatory Visit (HOSPITAL_COMMUNITY): Payer: Self-pay

## 2020-06-25 NOTE — Progress Notes (Signed)
Paramedicine Encounter    Patient ID: Brad [name], male    DOB: 10/11/1961, 58 y.o.   MRN: [id number]   Patient Care Team: Charlott Rakes, MD as PCP - General (Family Medicine) Debara Pickett Nadean Corwin, MD as PCP - Cardiology (Cardiology) Charlott Rakes, MD (Family Medicine) Charlott Rakes, MD (Family Medicine)  Patient Active Problem List   Diagnosis Date Noted  . Acute exacerbation of CHF (congestive heart failure) ([place]) 05/29/2020  . Chronic combined systolic and diastolic CHF (congestive heart failure) ([place]) 04/19/2020  . Alcohol abuse with intoxication ([place]) 03/23/2020  . Hyponatremia 03/23/2020  . Fall 03/23/2020  . Normocytic anemia 03/23/2020  . Leukopenia 03/23/2020  . HTN (hypertension) 03/23/2020  . HLD (hyperlipidemia) 03/23/2020  . AKI (acute kidney injury) ([place]) 03/23/2020  . CHF (congestive heart failure) ([place]) 03/23/2020  . Alcohol abuse with intoxication ([place]) 03/05/2020  . Chronic systolic CHF (congestive heart failure) ([place]) 10/31/2019  . Acute systolic CHF (congestive heart failure) ([place]) 08/02/2019  . Diarrhea 07/29/2019  . Gastroenteritis 07/22/2019  . Hypomagnesemia 06/19/2019  . Chest pain 06/07/2019  . Elevated troponin 05/23/2019  . Tobacco abuse 05/23/2019  . Alcohol abuse 02/21/2019  . Frequent falls 01/17/2019  . Severe mitral regurgitation   . Fall 11/01/2018  . Hyponatremia 10/31/2018  . Chronic atrial fibrillation   . Chronic hyponatremia 08/16/2018  . NSVT (nonsustained ventricular tachycardia) ([place])   . Concussion with loss of consciousness   . Scalp laceration   . Trauma   . Permanent atrial fibrillation   . Hypertensive heart disease   . Shortness of breath   . Pyogenic inflammation of bone ([place])   . Cirrhosis ([place]) 03/23/2018  . Right ankle pain 03/23/2018  . Syncope 08/07/2017  . Acute on chronic combined systolic and diastolic CHF (congestive heart failure) ([place]) 07/09/2017  . Atrial flutter ([place]) 07/09/2017  . Pre-syncope  07/08/2017  . Neuropathy 05/08/2017  . Substance induced mood disorder ([place]) 10/06/2016  . CVA (cerebral vascular accident) ([place]) 09/18/2016  . Left sided numbness   . Homelessness 08/21/2016  . S/P ORIF (open reduction internal fixation) fracture 08/01/2016  . CAD (coronary artery disease), native coronary artery 07/30/2016  . Surgery, elective   . Insomnia 07/22/2016  . NSTEMI (non-ST elevated myocardial infarction) ([place])   . Anemia 07/05/2016  . Thrombocytopenia ([place]) 07/05/2016  . Cocaine abuse ([place]) 07/02/2016  . Chest pain on breathing 07/01/2016  . Essential hypertension 07/01/2016  . DM type 2 (diabetes mellitus, type 2) ([place]) 07/01/2016  . Hypokalemia 07/01/2016  . CKD (chronic kidney disease) stage 3, GFR 30-59 ml/min 07/01/2016  . Painful diabetic neuropathy ([place]) 07/01/2016  . Acute hyponatremia 05/27/2016  . Polysubstance abuse ([place]) 05/27/2016  . Chronic hepatitis C with cirrhosis ([place]) 05/27/2016  . Depression 04/21/2012  . GERD (gastroesophageal reflux disease) 02/16/2012  . History of drug abuse ([place])   . Heroin addiction ([place]) 01/29/2012    Current Outpatient Medications:  .  amiodarone (PACERONE) 200 MG tablet, Take 1 tablet (200 mg total) by mouth daily., Disp: 30 tablet, Rfl: 0 .  aspirin EC 81 MG EC tablet, Take 1 tablet (81 mg total) by mouth daily. Swallow whole., Disp: 90 tablet, Rfl: 0 .  atorvastatin (LIPITOR) 20 MG tablet, Take 1 tablet (20 mg total) by mouth daily at 6 PM., Disp: 30 tablet, Rfl: 1 .  blood glucose meter kit and supplies KIT, Dispense based on patient and insurance preference. Use up to four times daily as directed. (FOR ICD-9 250.00, 250.01)., Disp:  1 each, Rfl: 0 .  escitalopram (LEXAPRO) 10 MG tablet, Take 1 tablet (10 mg total) by mouth daily., Disp: 90 tablet, Rfl: 0 .  folic acid (FOLVITE) 1 MG tablet, Take 1 tablet (1 mg total) by mouth daily., Disp: 90 tablet, Rfl: 0 .  gabapentin (NEURONTIN) 300 MG capsule, Take 2 capsules (600  mg total) by mouth at bedtime., Disp: 60 capsule, Rfl: 3 .  glipiZIDE (GLUCOTROL) 5 MG tablet, Take 0.5 tablets (2.5 mg total) by mouth daily before breakfast., Disp: 30 tablet, Rfl: 0 .  HYDROcodone-homatropine (HYCODAN) 5-1.5 MG/5ML syrup, Take 5 mLs by mouth every 6 (six) hours as needed for cough., Disp: 120 mL, Rfl: 0 .  hydrOXYzine (ATARAX/VISTARIL) 10 MG tablet, Take 1 tablet (10 mg total) by mouth 3 (three) times daily as needed for up to 20 doses for anxiety., Disp: 20 tablet, Rfl: 0 .  isosorbide-hydrALAZINE (BIDIL) 20-37.5 MG tablet, Take 0.5 tablets by mouth 3 (three) times daily., Disp: 45 tablet, Rfl: 1 .  lidocaine (LIDODERM) 5 %, Place 1 patch onto the skin daily. Remove & Discard patch within 12 hours or as directed by MD, Disp: 30 patch, Rfl: 0 .  methocarbamol (ROBAXIN) 500 MG tablet, Take 1 tablet (500 mg total) by mouth every 6 (six) hours as needed for muscle spasms., Disp: 30 tablet, Rfl: 0 .  Multiple Vitamin (MULTIVITAMIN WITH MINERALS) TABS tablet, Take 1 tablet by mouth daily., Disp: 90 tablet, Rfl: 0 .  sodium chloride 1 g tablet, Take 2 tablets (2 g total) by mouth daily., Disp: 60 tablet, Rfl: 0 .  spironolactone (ALDACTONE) 25 MG tablet, Take 1 tablet (25 mg total) by mouth daily., Disp: 30 tablet, Rfl: 0 .  thiamine 100 MG tablet, Take 1 tablet (100 mg total) by mouth daily., Disp: 90 tablet, Rfl: 0 .  torsemide (DEMADEX) 10 MG tablet, Take 2 tablets (20 mg total) by mouth daily., Disp: 60 tablet, Rfl: 0 .  traMADol (ULTRAM) 50 MG tablet, Take 1 tablet (50 mg total) by mouth every 8 (eight) hours as needed for moderate pain., Disp: 14 tablet, Rfl: 0 Allergies  Allergen Reactions  . Angiotensin Receptor Blockers Anaphylaxis and Other (See Comments)    (Angioedema also with Lisinopril, therefore ARB's are contraindicated)  . Lisinopril Anaphylaxis and Swelling    Throat swelling  . Pamelor [Nortriptyline Hcl] Anaphylaxis and Swelling    Throat swells     Social  History   Socioeconomic History  . Marital status: Single    Spouse name: Not on file  . Number of children: 3  . Years of education: 2y college  . Highest education level: Not on file  Occupational History  . 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 Smoking status: Current Every Day Smoker    Packs/day: 0.50    Years: 28.00    Pack years: 14.00    Types: Cigarettes  . Smokeless tobacco: Never Used  Vaping Use  . Vaping Use: Never used  Substance and Sexual Activity  . Alcohol use: Yes    Alcohol/week: 25.0 standard drinks    Types: 25 Cans of beer per week    Comment: "depends on the day"; reports not drinking every day, "I stopped about 2-3 wekes ago" - but drank yesterday  . Drug use: Not Currently    Types: IV, Cocaine, Heroin    Comment: 08/17/2018 "no heroin since 2013; I use cocaine ~  once/month, last use 03/21/2019  . Sexual activity: Not Currently  Other Topics Concern  . Not on file  Social History Narrative   ** Merged History Encounter **       ** Merged History Encounter **       Incarcerated from 2006-2010, then 10/2011-12/2011.  Has been trying to get sober (no heroin, alcohol since 10/2011).    Social Determinants of Health   Financial Resource Strain:   . Difficulty of Paying Living Expenses:   Food Insecurity: No Food Insecurity  . Worried About Charity fundraiser in the Last Year: Never true  . Ran Out of Food in the Last Year: Never true  Transportation Needs:   . Lack of Transportation (Medical):   Marland Kitchen Lack of Transportation (Non-Medical):   Physical Activity:   . Days of Exercise per Week:   . Minutes of Exercise per Session:   Stress:   . Feeling of Stress :   Social Connections:   . Frequency of Communication with Friends and Family:   . Frequency of Social Gatherings with Friends and Family:   . Attends Religious Services:   . Active Member of Clubs or Organizations:   .  Attends Archivist Meetings:   Marland Kitchen Marital Status:   Intimate Partner Violence:   . Fear of Current or Ex-Partner:   . Emotionally Abused:   Marland Kitchen Physically Abused:   . Sexually Abused:     Physical Exam Vitals reviewed.  Constitutional:      Appearance: He is normal weight.  HENT:     Head: Normocephalic.     Nose: Nose normal.     Mouth/Throat:     Mouth: Mucous membranes are moist.     Pharynx: Oropharynx is clear.  Eyes:     Pupils: Pupils are equal, round, and reactive to light.  Cardiovascular:     Rate and Rhythm: Normal rate and regular rhythm.     Pulses: Normal pulses.     Heart sounds: Normal heart sounds.  Pulmonary:     Effort: Pulmonary effort is normal.     Breath sounds: Normal breath sounds.  Abdominal:     General: Abdomen is flat.     Palpations: Abdomen is soft.  Musculoskeletal:        General: Normal range of motion.     Cervical back: Normal range of motion.     Right lower leg: Edema present.     Left lower leg: Edema present.  Skin:    General: Skin is warm and dry.     Capillary Refill: Capillary refill takes less than 2 seconds.  Neurological:     Mental Status: He is alert. Mental status is at baseline.  Psychiatric:        Mood and Affect: Mood normal.    Arrived for home visit for Mieczyslaw who met me outside where he sat in an office chair on the side walk outside of his apartment. Kemani is walking with his cane due to feeling off balance. Maximillian was recently discharged from [facility] due to low sodium and a fall in the parking lot. Drystan had multiple dated injuries from the fall including facial abrasions/bruises, hand abrasions, leg abrasions and rib cage pain with some bruising. Caden denied shortness of breath. Lung sounds were clear. Medications were reviewed. Joden has several new medications that have not been picked up. Including: -Thiamine  -Sodium Chloride  -Multi-Vitamin -Hydrozyxine  -Glipizide -Lexapro -Folic  Acid -Aspirin  Kelly was made aware of my absence for next week and refused for me to complete two pill boxes, instead he requested a list of medicines and when to take him and states his roommate [name] can fill his pill box. Carols was given a list of medications and when to take them and roommate [name] was educated on same during visit.   Vitals were obtained and are as noted. Suvan reports he is sleeping on the floor currently because their apartment is infested with bed bugs. Eward states he is trying to re-locate. Eliezer Lofts LCSW is assisting with same.   Home visit complete. I will see patient in two weeks.        No future appointments.   ACTION: Home visit completed Next visit planned for July 19th.

## 2020-06-26 ENCOUNTER — Telehealth (HOSPITAL_COMMUNITY): Payer: Self-pay

## 2020-06-26 ENCOUNTER — Telehealth: Payer: Self-pay

## 2020-06-26 NOTE — Telephone Encounter (Signed)
Call placed to Ms Graylon Good at [employer].  # [phone], regarding patient's application for housing. Message left with call back requested to this CM.      Transition Care Management Follow-up Telephone Call  Date of discharge and from where: 06/24/2020, [facility]   How have you been since you were released from the hospital? He is concerned about getting permanent housing due to the bed bug infestation at his apartment.  Informed him that this CM called Ms Graylon Good /[employer] and left an other message today requesting a call back.  Provided the patient with Ms Snyder's phone number at the housing authority and encouraged him to also try to reach her.   He said that his right side  is now hurting him as he has to sleep on the floor. He is hoping to get an inflatable mattress.  Any questions or concerns? noted above  Items Reviewed:  Did the pt receive and understand the discharge instructions provided? yes  Medications obtained and verified? he said that he still needs to pick up  his new medications and a lot of refills at the pharmacy. This CM stressed the importance of getting these medications as soon as possible.  [name], EMT has reviewed medication instructions with the patient and his roommate,Mike, who will update the med box with the new medications.   Any new allergies since your discharge?  none reported   Dietary orders reviewed?he stated that he is adhering to  1500 ml/day fluid restriction and avoiding alcohol.   Support at home ? his roommate, [name]  No home health at this time.  Has a scale.  Said the he is weighing himself daily. This morning 173 lbs.  He is seen by [name], EMT/community paramedicine program weekly.   Functional Questionnaire: (I = Independent and D = Dependent) ADLs: he tries to be as independent as possible. Has cane and  RW for assistance with ambulation. His roommate assists with ADLS as needed and  Othelia Pulling, EMT assists with medication management.   Follow up appointments reviewed:   PCP Hospital f/u appt confirmed?Dr Margarita Rana 07/05/2020  [facility] f/u appt confirmed?none scheduled at this time Needs a follow up appt with cardiology.  Referral was also made to neurology.   Are transportation arrangements needed? no  If their condition worsens, is the pt aware to call PCP or go to the Emergency Dept.?  yes  Was the patient provided with contact information for the PCP's office or ED?  he has the phone number for this clinic  Was to pt encouraged to call back with questions or concerns? yes

## 2020-06-26 NOTE — Telephone Encounter (Signed)
From the discharge call.  He has an appointment with Dr Margarita Rana 07/05/2020.    He is concerned about getting permanent housing due to the bed bug infestation at his apartment.  Informed him that this CM called Ms Graylon Good /[employer] and left an other message today requesting a call back.  Provided the patient with Ms Snyder's phone number at the housing authority and encouraged him to also try to reach her.   He said that his right side  is now hurting him as he has to sleep on the floor. He is hoping to get an inflatable mattress.     he said that he still needs to pick up  his new medications and a lot of refills at the pharmacy. This CM stressed the importance of getting these medications as soon as possible.  [name], EMT has reviewed medication instructions with the patient and his roommate,Mike, who will update the med box with the new medications.

## 2020-06-26 NOTE — Telephone Encounter (Signed)
Eliezer Lofts called stating Sion called her with concerns of difficulty breathing and swelling in his legs. I had home visit yesterday with Bader and he reported having no trouble breathing with mild swelling in legs due to not having medications however I gave him daily meds and filled 2 pill boxes for him.   I called Tadhg at [phone] and he reports he feels fine now and has no trouble breathing and his swelling has gotten better since he took his medicines. I informed Carles to call me if the need arises or to seek medical attention if necessary. He agreed and understood. Call complete.

## 2020-06-27 ENCOUNTER — Encounter (HOSPITAL_COMMUNITY): Payer: Self-pay | Admitting: Emergency Medicine

## 2020-06-27 ENCOUNTER — Telehealth: Payer: Self-pay | Admitting: Licensed Clinical Social Worker

## 2020-06-27 ENCOUNTER — Other Ambulatory Visit: Payer: Self-pay

## 2020-06-27 ENCOUNTER — Ambulatory Visit (HOSPITAL_COMMUNITY)
Admission: EM | Admit: 2020-06-27 | Discharge: 2020-06-27 | Disposition: A | Discharge status: Home / Self Care | Payer: Medicaid Other

## 2020-06-27 DIAGNOSIS — S0990XA Unspecified injury of head, initial encounter: Secondary | ICD-10-CM | POA: Diagnosis not present

## 2020-06-27 DIAGNOSIS — W19XXXA Unspecified fall, initial encounter: Secondary | ICD-10-CM

## 2020-06-27 NOTE — ED Triage Notes (Signed)
Pt sts trip and fall 5 days ago; pt sts pain in right rib area since fall

## 2020-06-27 NOTE — Telephone Encounter (Signed)
CSW received call from pt to inquire about updates with housing authority project voucher which case worker at [place] is assisting pt in moving forward with.  [name] with CHW is trying to confirm with [employer] what pt will need to get project based voucher and especially confirming if pt needs a birth certificate or not and if so if that birth certificate needs to have his name of birth or his current name on it.  CSW discussed with [name] but they have not been able to get a hold of someone at [employer] at this time.  Pt also voiced continued frustrations with his housing situation.  Reports the bed bugs are really bad but they have already tried sprays and foggers without success.  At this point he has been sleeping on the floor because the couch is so infested and wanting to get an air mattress for him to sleep on.  CSW able to order air mattress for pt- will have a Clinical biochemist bring out to pt when it arrives along with a blanket.  CSW will continue to follow and assist as needed.  [name], LCSW Clinical Social Worker [facility] Desk#: [phone] Cell#: [phone]

## 2020-06-27 NOTE — Telephone Encounter (Signed)
Call placed to [facility] # [phone], spoke to [employer].  She said that she spoke to the patient earlier and told him that he needed a birth certificate.  This CM informed her that he had a name change and that the current name is not on his birth certificate.  She said since he receives social security he may not even need the birth certificate.  She will have Ms Graylon Good contact me - provided her with # [phone] and my email.  Explained to her that the patient needs to know exactly what else is needed for the application, what is the time frame that he would be able to move into the housing and what, if any, cost is there for him.  Update provided to Tammy Sours, LCSW
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
## 2020-06-27 NOTE — Discharge Instructions (Addendum)
Please go to the ER for further evaluation and a CAT scan of your head

## 2020-06-27 NOTE — ED Notes (Signed)
Patient is being discharged from the Urgent Care and sent to the Emergency Department via personal vehicle . Per provider Loura Halt, patient is in need of higher level of care due to head injury. Patient is aware and verbalizes understanding of plan of care.  Vitals:   06/27/20 1209  BP: (!) 143/97  Pulse: 92  Resp: 18  Temp: 98.4 F (36.9 C)  SpO2: 100%

## 2020-06-29 ENCOUNTER — Telehealth: Payer: Self-pay

## 2020-06-29 NOTE — Telephone Encounter (Signed)
Call received from patient inquiring if this CM has heard back from the [employer].  Informed him that there has been no response back.  He said he will follow up with the housing authority next week.

## 2020-06-30 ENCOUNTER — Emergency Department (HOSPITAL_COMMUNITY): Payer: Medicaid Other

## 2020-06-30 ENCOUNTER — Other Ambulatory Visit: Payer: Self-pay

## 2020-06-30 ENCOUNTER — Encounter (HOSPITAL_COMMUNITY): Payer: Self-pay

## 2020-06-30 ENCOUNTER — Inpatient Hospital Stay (HOSPITAL_COMMUNITY)
Admission: EM | Admit: 2020-06-30 | Discharge: 2020-07-03 | DRG: 644 | Disposition: A | Discharge status: Home / Self Care | Payer: Medicaid Other | Attending: Internal Medicine | Admitting: Internal Medicine

## 2020-06-30 DIAGNOSIS — W010XXA Fall on same level from slipping, tripping and stumbling without subsequent striking against object, initial encounter: Secondary | ICD-10-CM | POA: Diagnosis present

## 2020-06-30 DIAGNOSIS — E1165 Type 2 diabetes mellitus with hyperglycemia: Secondary | ICD-10-CM | POA: Diagnosis present

## 2020-06-30 DIAGNOSIS — I4821 Permanent atrial fibrillation: Secondary | ICD-10-CM | POA: Diagnosis present

## 2020-06-30 DIAGNOSIS — I252 Old myocardial infarction: Secondary | ICD-10-CM

## 2020-06-30 DIAGNOSIS — E1140 Type 2 diabetes mellitus with diabetic neuropathy, unspecified: Secondary | ICD-10-CM | POA: Diagnosis present

## 2020-06-30 DIAGNOSIS — I10 Essential (primary) hypertension: Secondary | ICD-10-CM | POA: Diagnosis present

## 2020-06-30 DIAGNOSIS — Z96652 Presence of left artificial knee joint: Secondary | ICD-10-CM | POA: Diagnosis present

## 2020-06-30 DIAGNOSIS — E222 Syndrome of inappropriate secretion of antidiuretic hormone: Principal | ICD-10-CM | POA: Diagnosis present

## 2020-06-30 DIAGNOSIS — Z888 Allergy status to other drugs, medicaments and biological substances status: Secondary | ICD-10-CM

## 2020-06-30 DIAGNOSIS — E119 Type 2 diabetes mellitus without complications: Secondary | ICD-10-CM

## 2020-06-30 DIAGNOSIS — Z7982 Long term (current) use of aspirin: Secondary | ICD-10-CM

## 2020-06-30 DIAGNOSIS — Z8249 Family history of ischemic heart disease and other diseases of the circulatory system: Secondary | ICD-10-CM

## 2020-06-30 DIAGNOSIS — I5022 Chronic systolic (congestive) heart failure: Secondary | ICD-10-CM | POA: Diagnosis present

## 2020-06-30 DIAGNOSIS — K7469 Other cirrhosis of liver: Secondary | ICD-10-CM | POA: Diagnosis present

## 2020-06-30 DIAGNOSIS — Y9389 Activity, other specified: Secondary | ICD-10-CM

## 2020-06-30 DIAGNOSIS — I4892 Unspecified atrial flutter: Secondary | ICD-10-CM | POA: Diagnosis present

## 2020-06-30 DIAGNOSIS — Z8673 Personal history of transient ischemic attack (TIA), and cerebral infarction without residual deficits: Secondary | ICD-10-CM

## 2020-06-30 DIAGNOSIS — E7800 Pure hypercholesterolemia, unspecified: Secondary | ICD-10-CM | POA: Diagnosis present

## 2020-06-30 DIAGNOSIS — S2241XA Multiple fractures of ribs, right side, initial encounter for closed fracture: Secondary | ICD-10-CM | POA: Diagnosis present

## 2020-06-30 DIAGNOSIS — I482 Chronic atrial fibrillation, unspecified: Secondary | ICD-10-CM | POA: Diagnosis present

## 2020-06-30 DIAGNOSIS — Z8619 Personal history of other infectious and parasitic diseases: Secondary | ICD-10-CM

## 2020-06-30 DIAGNOSIS — E871 Hypo-osmolality and hyponatremia: Secondary | ICD-10-CM

## 2020-06-30 DIAGNOSIS — Z79899 Other long term (current) drug therapy: Secondary | ICD-10-CM

## 2020-06-30 DIAGNOSIS — I2510 Atherosclerotic heart disease of native coronary artery without angina pectoris: Secondary | ICD-10-CM | POA: Diagnosis present

## 2020-06-30 DIAGNOSIS — F1010 Alcohol abuse, uncomplicated: Secondary | ICD-10-CM | POA: Diagnosis present

## 2020-06-30 DIAGNOSIS — Z20822 Contact with and (suspected) exposure to covid-19: Secondary | ICD-10-CM | POA: Diagnosis present

## 2020-06-30 DIAGNOSIS — I5042 Chronic combined systolic (congestive) and diastolic (congestive) heart failure: Secondary | ICD-10-CM | POA: Diagnosis present

## 2020-06-30 DIAGNOSIS — N183 Chronic kidney disease, stage 3 unspecified: Secondary | ICD-10-CM | POA: Diagnosis present

## 2020-06-30 DIAGNOSIS — F17210 Nicotine dependence, cigarettes, uncomplicated: Secondary | ICD-10-CM | POA: Diagnosis present

## 2020-06-30 DIAGNOSIS — E8779 Other fluid overload: Secondary | ICD-10-CM | POA: Diagnosis present

## 2020-06-30 DIAGNOSIS — R296 Repeated falls: Secondary | ICD-10-CM | POA: Diagnosis present

## 2020-06-30 DIAGNOSIS — Z8546 Personal history of malignant neoplasm of prostate: Secondary | ICD-10-CM

## 2020-06-30 DIAGNOSIS — E1122 Type 2 diabetes mellitus with diabetic chronic kidney disease: Secondary | ICD-10-CM | POA: Diagnosis present

## 2020-06-30 DIAGNOSIS — I130 Hypertensive heart and chronic kidney disease with heart failure and stage 1 through stage 4 chronic kidney disease, or unspecified chronic kidney disease: Secondary | ICD-10-CM | POA: Diagnosis present

## 2020-06-30 DIAGNOSIS — I428 Other cardiomyopathies: Secondary | ICD-10-CM | POA: Diagnosis present

## 2020-06-30 DIAGNOSIS — Y92015 Private garage of single-family (private) house as the place of occurrence of the external cause: Secondary | ICD-10-CM

## 2020-06-30 LAB — BASIC METABOLIC PANEL
Anion gap: 7 (ref 5–15)
Anion gap: 11 (ref 5–15)
BUN: 18 mg/dL (ref 6–20)
BUN: 19 mg/dL (ref 6–20)
CO2: 24 mmol/L (ref 22–32)
CO2: 27 mmol/L (ref 22–32)
Calcium: 8.4 mg/dL — ABNORMAL LOW (ref 8.9–10.3)
Calcium: 8.7 mg/dL — ABNORMAL LOW (ref 8.9–10.3)
Chloride: 85 mmol/L — ABNORMAL LOW (ref 98–111)
Chloride: 84 mmol/L — ABNORMAL LOW (ref 98–111)
Creatinine, Ser: 1.3 mg/dL — ABNORMAL HIGH (ref 0.61–1.24)
Creatinine, Ser: 1.34 mg/dL — ABNORMAL HIGH (ref 0.61–1.24)
GFR calc Af Amer: >60 mL/min (ref >60)
GFR calc Af Amer: >60 mL/min (ref >60)
GFR calc non Af Amer: 59 mL/min — ABNORMAL LOW (ref >60)
GFR calc non Af Amer: 57 mL/min — ABNORMAL LOW (ref >60)
Glucose, Bld: 154 mg/dL — ABNORMAL HIGH (ref 70–99)
Glucose, Bld: 148 mg/dL — ABNORMAL HIGH (ref 70–99)
Potassium: 4.5 mmol/L (ref 3.5–5.1)
Potassium: 4.7 mmol/L (ref 3.5–5.1)
Sodium: 116 mmol/L — CL (ref 135–145)
Sodium: 122 mmol/L — ABNORMAL LOW (ref 135–145)

## 2020-06-30 LAB — CBC
HCT: 34.4 % — ABNORMAL LOW (ref 39.0–52.0)
Hemoglobin: 12 g/dL — ABNORMAL LOW (ref 13.0–17.0)
MCH: 30.7 pg (ref 26.0–34.0)
MCHC: 34.9 g/dL (ref 30.0–36.0)
MCV: 88 fL (ref 80.0–100.0)
Platelets: 251 10*3/uL (ref 150–400)
RBC: 3.91 MIL/uL — ABNORMAL LOW (ref 4.22–5.81)
RDW: 14 % (ref 11.5–15.5)
WBC: 5.1 10*3/uL (ref 4.0–10.5)
nRBC: 0 % (ref 0.0–0.2)

## 2020-06-30 LAB — CBC WITH DIFFERENTIAL/PLATELET
Abs Immature Granulocytes: 0.02 10*3/uL (ref 0.00–0.07)
Basophils Absolute: 0 10*3/uL (ref 0.0–0.1)
Basophils Relative: 0 %
Eosinophils Absolute: 0.1 10*3/uL (ref 0.0–0.5)
Eosinophils Relative: 1 %
HCT: 33.5 % — ABNORMAL LOW (ref 39.0–52.0)
Hemoglobin: 11.5 g/dL — ABNORMAL LOW (ref 13.0–17.0)
Immature Granulocytes: 0 %
Lymphocytes Relative: 6 %
Lymphs Abs: 0.3 10*3/uL — ABNORMAL LOW (ref 0.7–4.0)
MCH: 30 pg (ref 26.0–34.0)
MCHC: 34.3 g/dL (ref 30.0–36.0)
MCV: 87.5 fL (ref 80.0–100.0)
Monocytes Absolute: 1 10*3/uL (ref 0.1–1.0)
Monocytes Relative: 21 %
Neutro Abs: 3.6 10*3/uL (ref 1.7–7.7)
Neutrophils Relative %: 72 %
Platelets: 223 10*3/uL (ref 150–400)
RBC: 3.83 MIL/uL — ABNORMAL LOW (ref 4.22–5.81)
RDW: 14 % (ref 11.5–15.5)
WBC: 5 10*3/uL (ref 4.0–10.5)
nRBC: 0 % (ref 0.0–0.2)

## 2020-06-30 LAB — GLUCOSE, CAPILLARY
Glucose-Capillary: 177 mg/dL — ABNORMAL HIGH (ref 70–99)
Glucose-Capillary: 187 mg/dL — ABNORMAL HIGH (ref 70–99)

## 2020-06-30 LAB — BRAIN NATRIURETIC PEPTIDE: B Natriuretic Peptide: 656.4 pg/mL — ABNORMAL HIGH (ref 0.0–100.0)

## 2020-06-30 LAB — TSH: TSH: 4.596 u[IU]/mL — ABNORMAL HIGH (ref 0.350–4.500)

## 2020-06-30 LAB — CREATININE, SERUM
Creatinine, Ser: 1.28 mg/dL — ABNORMAL HIGH (ref 0.61–1.24)
GFR calc Af Amer: >60 mL/min (ref >60)
GFR calc non Af Amer: >60 mL/min (ref >60)

## 2020-06-30 LAB — SARS CORONAVIRUS 2 BY RT PCR (HOSPITAL ORDER, PERFORMED IN ~~LOC~~ HOSPITAL LAB): SARS Coronavirus 2: NEGATIVE

## 2020-06-30 MED ORDER — METHOCARBAMOL 500 MG PO TABS: 500.0000 mg | ORAL_TABLET | Freq: Four times a day (QID) | ORAL | Status: DC | PRN

## 2020-06-30 MED ORDER — ENOXAPARIN SODIUM 40 MG/0.4ML ~~LOC~~ SOLN
40.0000 mg | SUBCUTANEOUS | Status: DC
  Administered 2020-06-30: 40 mg via SUBCUTANEOUS
  Filled 2020-06-30: qty 0.4

## 2020-06-30 MED ORDER — ONDANSETRON HCL 4 MG PO TABS: 4.0000 mg | ORAL_TABLET | Freq: Four times a day (QID) | ORAL | Status: DC | PRN

## 2020-06-30 MED ORDER — LIDOCAINE 5 % EX PTCH
1.0000 | MEDICATED_PATCH | CUTANEOUS | Status: DC
  Administered 2020-06-30 – 2020-07-03 (×4): 1 via TRANSDERMAL
  Filled 2020-06-30 (×4): qty 1

## 2020-06-30 MED ORDER — FUROSEMIDE 10 MG/ML IJ SOLN
40.0000 mg | Freq: Two times a day (BID) | INTRAMUSCULAR | Status: DC
  Administered 2020-06-30 – 2020-07-03 (×6): 40 mg via INTRAVENOUS
  Filled 2020-06-30 (×6): qty 4

## 2020-06-30 MED ORDER — GLIPIZIDE 5 MG PO TABS
2.5000 mg | ORAL_TABLET | Freq: Every day | ORAL | Status: DC
  Administered 2020-07-01 – 2020-07-03 (×3): 2.5 mg via ORAL
  Filled 2020-06-30 (×4): qty 1

## 2020-06-30 MED ORDER — FOLIC ACID 1 MG PO TABS
1.0000 mg | ORAL_TABLET | Freq: Every day | ORAL | Status: DC
  Administered 2020-06-30 – 2020-07-03 (×4): 1 mg via ORAL
  Filled 2020-06-30 (×5): qty 1

## 2020-06-30 MED ORDER — HYDROCODONE-ACETAMINOPHEN 5-325 MG PO TABS
1.0000 | ORAL_TABLET | Freq: Four times a day (QID) | ORAL | Status: DC | PRN
  Administered 2020-06-30: 1 via ORAL
  Administered 2020-06-30 – 2020-07-03 (×9): 2 via ORAL
  Filled 2020-06-30 (×2): qty 2
  Filled 2020-06-30: qty 1
  Filled 2020-06-30 (×8): qty 2

## 2020-06-30 MED ORDER — ATORVASTATIN CALCIUM 10 MG PO TABS
20.0000 mg | ORAL_TABLET | Freq: Every day | ORAL | Status: DC
  Administered 2020-06-30 – 2020-07-02 (×3): 20 mg via ORAL
  Filled 2020-06-30 (×3): qty 2

## 2020-06-30 MED ORDER — SPIRONOLACTONE 25 MG PO TABS
25.0000 mg | ORAL_TABLET | Freq: Every day | ORAL | Status: DC
  Administered 2020-06-30 – 2020-07-03 (×4): 25 mg via ORAL
  Filled 2020-06-30 (×4): qty 1

## 2020-06-30 MED ORDER — ONDANSETRON HCL 4 MG/2ML IJ SOLN: 4.0000 mg | Freq: Four times a day (QID) | INTRAMUSCULAR | Status: DC | PRN

## 2020-06-30 MED ORDER — SODIUM CHLORIDE 3 % IV SOLN
INTRAVENOUS | Status: DC
  Filled 2020-06-30: qty 500

## 2020-06-30 MED ORDER — SODIUM CHLORIDE 3 % IV BOLUS
100.0000 mL | Freq: Once | INTRAVENOUS | Status: DC
  Filled 2020-06-30: qty 100

## 2020-06-30 MED ORDER — MORPHINE SULFATE (PF) 2 MG/ML IV SOLN
2.0000 mg | INTRAVENOUS | Status: AC | PRN
  Administered 2020-06-30 – 2020-07-01 (×6): 2 mg via INTRAVENOUS
  Filled 2020-06-30 (×6): qty 1

## 2020-06-30 MED ORDER — ISOSORB DINITRATE-HYDRALAZINE 20-37.5 MG PO TABS
0.5000 | ORAL_TABLET | Freq: Three times a day (TID) | ORAL | Status: DC
  Administered 2020-06-30 – 2020-07-03 (×9): 0.5 via ORAL
  Filled 2020-06-30 (×10): qty 0.5

## 2020-06-30 MED ORDER — AMIODARONE HCL 200 MG PO TABS
200.0000 mg | ORAL_TABLET | Freq: Every day | ORAL | Status: DC
  Administered 2020-06-30 – 2020-07-03 (×4): 200 mg via ORAL
  Filled 2020-06-30 (×4): qty 1

## 2020-06-30 MED ORDER — BISACODYL 5 MG PO TBEC: 5.0000 mg | DELAYED_RELEASE_TABLET | Freq: Every day | ORAL | Status: DC | PRN

## 2020-06-30 MED ORDER — FENTANYL CITRATE (PF) 100 MCG/2ML IJ SOLN
50.0000 ug | Freq: Once | INTRAMUSCULAR | Status: AC
  Administered 2020-06-30: 50 ug via INTRAMUSCULAR
  Filled 2020-06-30: qty 2

## 2020-06-30 MED ORDER — FUROSEMIDE 10 MG/ML IJ SOLN
40.0000 mg | Freq: Once | INTRAMUSCULAR | Status: AC
  Administered 2020-06-30: 40 mg via INTRAVENOUS
  Filled 2020-06-30: qty 4

## 2020-06-30 MED ORDER — FENTANYL CITRATE (PF) 100 MCG/2ML IJ SOLN: 50.0000 ug | Freq: Once | INTRAMUSCULAR | Status: DC

## 2020-06-30 NOTE — ED Notes (Signed)
Date and time results received: 06/30/20 8:57 AM  (use smartphrase ".now" to insert current time)  Test: Na+ Critical Value: 116  Name of Provider Notified: P.A. Chrys Racer Orders Received? Or Actions Taken?:
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
## 2020-06-30 NOTE — ED Notes (Signed)
Attempted to call report to the 4th floor (4E) and they state the room is not approved yet and they need to talk to changer nurse.
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
## 2020-06-30 NOTE — Progress Notes (Signed)
Pt has had multiple runs of PVCs throughout the day. EKG obtained. Vitals WNL. Pt states he feels okay. Will continue to monitor.

## 2020-06-30 NOTE — ED Triage Notes (Signed)
Pt sts a fall 1.5 weeks ago and describes right sided rib pain that has been causing difficulty breathing.

## 2020-07-01 DIAGNOSIS — E871 Hypo-osmolality and hyponatremia: Secondary | ICD-10-CM

## 2020-07-01 DIAGNOSIS — Z8546 Personal history of malignant neoplasm of prostate: Secondary | ICD-10-CM | POA: Diagnosis not present

## 2020-07-01 DIAGNOSIS — K7469 Other cirrhosis of liver: Secondary | ICD-10-CM | POA: Diagnosis present

## 2020-07-01 DIAGNOSIS — E1165 Type 2 diabetes mellitus with hyperglycemia: Secondary | ICD-10-CM | POA: Diagnosis present

## 2020-07-01 DIAGNOSIS — Z888 Allergy status to other drugs, medicaments and biological substances status: Secondary | ICD-10-CM | POA: Diagnosis not present

## 2020-07-01 DIAGNOSIS — E1140 Type 2 diabetes mellitus with diabetic neuropathy, unspecified: Secondary | ICD-10-CM | POA: Diagnosis present

## 2020-07-01 DIAGNOSIS — Z794 Long term (current) use of insulin: Secondary | ICD-10-CM

## 2020-07-01 DIAGNOSIS — S2241XA Multiple fractures of ribs, right side, initial encounter for closed fracture: Secondary | ICD-10-CM | POA: Diagnosis present

## 2020-07-01 DIAGNOSIS — I2510 Atherosclerotic heart disease of native coronary artery without angina pectoris: Secondary | ICD-10-CM | POA: Diagnosis present

## 2020-07-01 DIAGNOSIS — I252 Old myocardial infarction: Secondary | ICD-10-CM | POA: Diagnosis not present

## 2020-07-01 DIAGNOSIS — R296 Repeated falls: Secondary | ICD-10-CM | POA: Diagnosis present

## 2020-07-01 DIAGNOSIS — Z96652 Presence of left artificial knee joint: Secondary | ICD-10-CM | POA: Diagnosis present

## 2020-07-01 DIAGNOSIS — I4821 Permanent atrial fibrillation: Secondary | ICD-10-CM | POA: Diagnosis present

## 2020-07-01 DIAGNOSIS — W010XXA Fall on same level from slipping, tripping and stumbling without subsequent striking against object, initial encounter: Secondary | ICD-10-CM | POA: Diagnosis present

## 2020-07-01 DIAGNOSIS — I5042 Chronic combined systolic (congestive) and diastolic (congestive) heart failure: Secondary | ICD-10-CM | POA: Diagnosis present

## 2020-07-01 DIAGNOSIS — E1159 Type 2 diabetes mellitus with other circulatory complications: Secondary | ICD-10-CM

## 2020-07-01 DIAGNOSIS — E1122 Type 2 diabetes mellitus with diabetic chronic kidney disease: Secondary | ICD-10-CM | POA: Diagnosis present

## 2020-07-01 DIAGNOSIS — Z20822 Contact with and (suspected) exposure to covid-19: Secondary | ICD-10-CM | POA: Diagnosis present

## 2020-07-01 DIAGNOSIS — N1831 Chronic kidney disease, stage 3a: Secondary | ICD-10-CM

## 2020-07-01 DIAGNOSIS — E7800 Pure hypercholesterolemia, unspecified: Secondary | ICD-10-CM | POA: Diagnosis present

## 2020-07-01 DIAGNOSIS — F1010 Alcohol abuse, uncomplicated: Secondary | ICD-10-CM | POA: Diagnosis present

## 2020-07-01 DIAGNOSIS — Y92015 Private garage of single-family (private) house as the place of occurrence of the external cause: Secondary | ICD-10-CM | POA: Diagnosis not present

## 2020-07-01 DIAGNOSIS — Z8249 Family history of ischemic heart disease and other diseases of the circulatory system: Secondary | ICD-10-CM | POA: Diagnosis not present

## 2020-07-01 DIAGNOSIS — I428 Other cardiomyopathies: Secondary | ICD-10-CM | POA: Diagnosis present

## 2020-07-01 DIAGNOSIS — I4892 Unspecified atrial flutter: Secondary | ICD-10-CM | POA: Diagnosis present

## 2020-07-01 DIAGNOSIS — E222 Syndrome of inappropriate secretion of antidiuretic hormone: Secondary | ICD-10-CM | POA: Diagnosis present

## 2020-07-01 DIAGNOSIS — Y9389 Activity, other specified: Secondary | ICD-10-CM | POA: Diagnosis not present

## 2020-07-01 DIAGNOSIS — N183 Chronic kidney disease, stage 3 unspecified: Secondary | ICD-10-CM | POA: Diagnosis present

## 2020-07-01 DIAGNOSIS — I130 Hypertensive heart and chronic kidney disease with heart failure and stage 1 through stage 4 chronic kidney disease, or unspecified chronic kidney disease: Secondary | ICD-10-CM | POA: Diagnosis present

## 2020-07-01 DIAGNOSIS — E8779 Other fluid overload: Secondary | ICD-10-CM | POA: Diagnosis present

## 2020-07-01 DIAGNOSIS — Z79899 Other long term (current) drug therapy: Secondary | ICD-10-CM | POA: Diagnosis not present

## 2020-07-01 LAB — COMPREHENSIVE METABOLIC PANEL
ALT: 19 U/L (ref 0–44)
AST: 24 U/L (ref 15–41)
Albumin: 3.2 g/dL — ABNORMAL LOW (ref 3.5–5.0)
Alkaline Phosphatase: 60 U/L (ref 38–126)
Anion gap: 13 (ref 5–15)
BUN: 23 mg/dL — ABNORMAL HIGH (ref 6–20)
CO2: 24 mmol/L (ref 22–32)
Calcium: 8.7 mg/dL — ABNORMAL LOW (ref 8.9–10.3)
Chloride: 87 mmol/L — ABNORMAL LOW (ref 98–111)
Creatinine, Ser: 1.45 mg/dL — ABNORMAL HIGH (ref 0.61–1.24)
GFR calc Af Amer: >60 mL/min (ref >60)
GFR calc non Af Amer: 52 mL/min — ABNORMAL LOW (ref >60)
Glucose, Bld: 148 mg/dL — ABNORMAL HIGH (ref 70–99)
Potassium: 4.6 mmol/L (ref 3.5–5.1)
Sodium: 124 mmol/L — ABNORMAL LOW (ref 135–145)
Total Bilirubin: 0.7 mg/dL (ref 0.3–1.2)
Total Protein: 7.1 g/dL (ref 6.5–8.1)

## 2020-07-01 NOTE — Assessment & Plan Note (Signed)
-  This is considered acute on chronic due to CHF, ongoing alcohol use, and possibly SIADH from recent illness/trauma/pain -Continue Lasix -Continue fluid restriction -Trend BMP and will further treat as needed

## 2020-07-01 NOTE — Assessment & Plan Note (Signed)
-  Continue Lasix -trend BMP -Monitor I&O

## 2020-07-01 NOTE — Progress Notes (Signed)
PROGRESS NOTE    Brad [name]   [id number]  DOB: 25-Jul-1961  DOA: 06/30/2020     0  PCP: Charlott Rakes, MD  CC: right chest pain  Hospital Course: Brad [name] is a 59 yo AA male with PMH sCHF, PAF, HTN, chronic hypoNa, EtOH use, HCV with cirrhosis, depression who presented with right-sided chest pain.  It was reported that he had a mechanical fall approximately 2 weeks prior to hospitalization and landed on his right side.  He has denied hitting his head multiple times when asked. He was evaluated at urgent care on 06/27/2020 and discharged home with tramadol for further recovery.  Due to worsening pain, he presented for further evaluation. CXR obtained in the ER revealed a right-sided rib fracture involving the fifth and sixth ribs.  He also was hyponatremic, 116.  He has had several hospitalizations over the past 6 months. He was admitted for fluid restriction and IV Lasix for volume overload.   Interval History:  Admitted after having worsening right-sided chest wall pain at home.  Informed him that he has 2 rib fractures which are contributing to his pain and likely to heal slowly.  He was otherwise resting in bed comfortably when seen this morning with no other acute concerns or complaints.  Old records reviewed in assessment of this patient  ROS: Constitutional: negative for chills, Respiratory: negative for cough, Cardiovascular: negative for chest pain and Gastrointestinal: negative for abdominal pain  Assessment & Plan: Hyponatremia -This is considered acute on chronic due to CHF, ongoing alcohol use, and possibly SIADH from recent illness/trauma/pain -Continue Lasix -Continue fluid restriction -Trend BMP and will further treat as needed  DM type 2 (diabetes mellitus, type 2) (HCC) -Continue SSI and POC glucose  Chronic atrial fibrillation -Continue amiodarone -Follow-up if patient has been anticoagulated previously -Continue Lovenox for DVT prophylaxis for  now  Chronic systolic CHF (congestive heart failure) (HCC) -Continue Lasix -trend BMP -Monitor I&O  Essential hypertension -Continue current regimen  CKD (chronic kidney disease) stage 3, GFR 30-59 ml/min -Baseline creatinine around 1.4.  Currently at baseline -Avoid nephrotoxic agents as able   Antimicrobials: None  DVT prophylaxis: Lovenox Code Status: Full Family Communication: None Disposition Plan:  Status is: Inpatient  Remains inpatient appropriate because:Persistent severe electrolyte disturbances, Ongoing active pain requiring inpatient pain management, Unsafe d/c plan, IV treatments appropriate due to intensity of illness or inability to take PO and Inpatient level of care appropriate due to severity of illness   Dispo: The patient is from: Home              Anticipated d/c is to: Pending PT eval              Anticipated d/c date is: 2 days              Patient currently is not medically stable to d/c.       Objective: Blood pressure 108/90, pulse 90, temperature 98 F (36.7 C), temperature source Oral, resp. rate 13, height 5\' 11"  (1.803 m), weight 81.2 kg, SpO2 93 %.  Examination: General appearance: alert, cooperative, no distress and slowed mentation Head: Normocephalic, without obvious abnormality Eyes: EOMI Lungs: Scattered coarse breath sounds bilaterally Chest wall: right sided chest wall tenderness Heart: regular rate and rhythm and S1, S2 normal Abdomen: normal findings: bowel sounds normal and soft, non-tender Extremities: 2+ bilateral lower extremity pitting edema Skin: mobility and turgor normal Neurologic: Grossly normal   Consultants:   None  Procedures:  None  Data Reviewed: I have personally reviewed following labs and imaging studies Results for orders placed or performed during the hospital encounter of 06/30/20 (from the past 24 hour(s))  Glucose, capillary     Status: Abnormal   Collection Time: 06/30/20  5:35 PM  Result  Value Ref Range   Glucose-Capillary 177 (H) 70 - 99 mg/dL  Glucose, capillary     Status: Abnormal   Collection Time: 06/30/20  7:39 PM  Result Value Ref Range   Glucose-Capillary 187 (H) 70 - 99 mg/dL  Comprehensive metabolic panel     Status: Abnormal   Collection Time: 07/01/20  5:47 AM  Result Value Ref Range   Sodium 124 (L) 135 - 145 mmol/L   Potassium 4.6 3.5 - 5.1 mmol/L   Chloride 87 (L) 98 - 111 mmol/L   CO2 24 22 - 32 mmol/L   Glucose, Bld 148 (H) 70 - 99 mg/dL   BUN 23 (H) 6 - 20 mg/dL   Creatinine, Ser 1.45 (H) 0.61 - 1.24 mg/dL   Calcium 8.7 (L) 8.9 - 10.3 mg/dL   Total Protein 7.1 6.5 - 8.1 g/dL   Albumin 3.2 (L) 3.5 - 5.0 g/dL   AST 24 15 - 41 U/L   ALT 19 0 - 44 U/L   Alkaline Phosphatase 60 38 - 126 U/L   Total Bilirubin 0.7 0.3 - 1.2 mg/dL   GFR calc non Af Amer 52 (L) >60 mL/min   GFR calc Af Amer >60 >60 mL/min   Anion gap 13 5 - 15   *Note: Due to a large number of results and/or encounters for the requested time period, some results have not been displayed. A complete set of results can be found in Results Review.    Recent Results (from the past 240 hour(s))  SARS Coronavirus 2 by RT PCR (hospital order, performed in [facility] hospital lab) Nasopharyngeal Nasopharyngeal Swab     Status: None   Collection Time: 06/21/20  6:09 PM   Specimen: Nasopharyngeal Swab  Result Value Ref Range Status   SARS Coronavirus 2 NEGATIVE NEGATIVE Final    Comment: (NOTE) SARS-CoV-2 target nucleic acids are NOT DETECTED.  The SARS-CoV-2 RNA is generally detectable in upper and lower respiratory specimens during the acute phase of infection. The lowest concentration of SARS-CoV-2 viral copies this assay can detect is 250 copies / mL. A negative result does not preclude SARS-CoV-2 infection and should not be used as the sole basis for treatment or other patient management decisions.  A negative result may occur with improper specimen collection / handling, submission  of specimen other than nasopharyngeal swab, presence of viral mutation(s) within the areas targeted by this assay, and inadequate number of viral copies (<250 copies / mL). A negative result must be combined with clinical observations, patient history, and epidemiological information.  Fact Sheet for Patients:   StrictlyIdeas.no  Fact Sheet for Healthcare Providers: BankingDealers.co.za  This test is not yet approved or  cleared by the [place] FDA and has been authorized for detection and/or diagnosis of SARS-CoV-2 by FDA under an Emergency Use Authorization (EUA).  This EUA will remain in effect (meaning this test can be used) for the duration of the COVID-19 declaration under Section 564(b)(1) of the Act, 21 U.S.C. section 360bbb-3(b)(1), unless the authorization is terminated or revoked sooner.  Performed at [facility], [place] [address] [place], [place] [address]   SARS Coronavirus 2 by RT PCR (hospital order, performed  in [place] lab) Nasopharyngeal Nasopharyngeal Swab     Status: None   Collection Time: 06/30/20  9:46 AM   Specimen: Nasopharyngeal Swab  Result Value Ref Range Status   SARS Coronavirus 2 NEGATIVE NEGATIVE Final    Comment: (NOTE) SARS-CoV-2 target nucleic acids are NOT DETECTED.  The SARS-CoV-2 RNA is generally detectable in upper and lower respiratory specimens during the acute phase of infection. The lowest concentration of SARS-CoV-2 viral copies this assay can detect is 250 copies / mL. A negative result does not preclude SARS-CoV-2 infection and should not be used as the sole basis for treatment or other patient management decisions.  A negative result may occur with improper specimen collection / handling, submission of specimen other than nasopharyngeal swab, presence of viral mutation(s) within the areas targeted by this assay, and inadequate number of viral copies (<250 copies  / mL). A negative result must be combined with clinical observations, patient history, and epidemiological information.  Fact Sheet for Patients:   StrictlyIdeas.no  Fact Sheet for Healthcare Providers: BankingDealers.co.za  This test is not yet approved or  cleared by the [place] FDA and has been authorized for detection and/or diagnosis of SARS-CoV-2 by FDA under an Emergency Use Authorization (EUA).  This EUA will remain in effect (meaning this test can be used) for the duration of the COVID-19 declaration under Section 564(b)(1) of the Act, 21 U.S.C. section 360bbb-3(b)(1), unless the authorization is terminated or revoked sooner.  Performed at [facility], [place] [address] [place], [place] [address]      Radiology Studies: DG Ribs Unilateral W/Chest Right  Result Date: 06/30/2020 CLINICAL DATA:  Rib pain, fall 1-2 weeks ago. EXAM: RIGHT RIBS AND CHEST - 3+ VIEW COMPARISON:  Chest x-rays dated 06/21/2020 and 05/29/2020. Chest CT dated 06/21/2020. FINDINGS: Stable cardiomegaly. Lungs are clear. No pleural effusion or pneumothorax is seen. Nondisplaced healing fracture of the RIGHT anterior sixth rib. Questionable acute slightly displaced fracture of the RIGHT anterior-lateral fifth rib. Osseous structures about the chest are otherwise unremarkable. IMPRESSION: 1. Healing/subacute nondisplaced fracture of the RIGHT anterior sixth rib. Questionable acute slightly displaced fracture of the RIGHT anterior-lateral fifth rib. 2. Stable cardiomegaly. No evidence of pneumonia or pulmonary edema. No pleural effusion or pneumothorax is seen. Electronically Signed   By: Franki Cabot M.D.   On: 06/30/2020 09:09   DG Ribs Unilateral W/Chest Right  Final Result       Scheduled Meds: . amiodarone  200 mg Oral Daily  . atorvastatin  20 mg Oral q1800  . enoxaparin (LOVENOX) injection  40 mg Subcutaneous Q24H  . folic acid   1 mg Oral Daily  . furosemide  40 mg Intravenous BID  . glipiZIDE  2.5 mg Oral QAC breakfast  . isosorbide-hydrALAZINE  0.5 tablet Oral TID  . lidocaine  1 patch Transdermal Q24H  . spironolactone  25 mg Oral Daily   PRN Meds: bisacodyl, HYDROcodone-acetaminophen, methocarbamol, morphine injection, ondansetron **OR** ondansetron (ZOFRAN) IV Continuous Infusions:    LOS: 0 days  Time spent: Greater than 50% of the 35 minute visit was spent in counseling/coordination of care for the patient as laid out in the A&P.   Dwyane Dee, MD [facility] 07/01/2020, 4:37 PM   Contact via secure chat.  To contact the attending provider between 7A-7P or the covering provider during after hours 7P-7A, please log into the web site [email or web address] and access using universal [place] password for that web site. If you  do not have the password, please call the hospital operator.

## 2020-07-01 NOTE — Assessment & Plan Note (Signed)
-  Continue SSI and POC glucose 

## 2020-07-01 NOTE — Assessment & Plan Note (Addendum)
-  Continue amiodarone -Patient states only takes aspirin at home -Continue Lovenox for DVT prophylaxis for now

## 2020-07-01 NOTE — Hospital Course (Addendum)
Brad [name] is a 59 yo AA male with PMH sCHF, PAF, HTN, chronic hypoNa, EtOH use, HCV with cirrhosis, depression who presented with right-sided chest pain.  It was reported that he had a mechanical fall approximately 2 weeks prior to hospitalization and landed on his right side.  He has denied hitting his head multiple times when asked. He was evaluated at urgent care on 06/27/2020 and discharged home with tramadol for further recovery.  Due to worsening pain, he presented for further evaluation. CXR obtained in the ER revealed a right-sided rib fracture involving the fifth and sixth ribs.  He also was hyponatremic, 116.  He has had several hospitalizations over the past 6 months. He was admitted for fluid restriction and IV Lasix for volume overload. He responded well to fluid restriction and Lasix with adequate urine output.  The morning following admission, he states that his breathing and lower extremity edema had also improved.

## 2020-07-01 NOTE — Assessment & Plan Note (Signed)
-  Baseline creatinine around 1.4.  Currently at baseline -Avoid nephrotoxic agents as able

## 2020-07-01 NOTE — Assessment & Plan Note (Signed)
Continue current regimen

## 2020-07-02 LAB — CBC WITH DIFFERENTIAL/PLATELET
Abs Immature Granulocytes: 0.01 10*3/uL (ref 0.00–0.07)
Basophils Absolute: 0 10*3/uL (ref 0.0–0.1)
Basophils Relative: 1 %
Eosinophils Absolute: 0.1 10*3/uL (ref 0.0–0.5)
Eosinophils Relative: 2 %
HCT: 31.8 % — ABNORMAL LOW (ref 39.0–52.0)
Hemoglobin: 10.8 g/dL — ABNORMAL LOW (ref 13.0–17.0)
Immature Granulocytes: 0 %
Lymphocytes Relative: 10 %
Lymphs Abs: 0.4 10*3/uL — ABNORMAL LOW (ref 0.7–4.0)
MCH: 30 pg (ref 26.0–34.0)
MCHC: 34 g/dL (ref 30.0–36.0)
MCV: 88.3 fL (ref 80.0–100.0)
Monocytes Absolute: 1.1 10*3/uL — ABNORMAL HIGH (ref 0.1–1.0)
Monocytes Relative: 24 %
Neutro Abs: 2.7 10*3/uL (ref 1.7–7.7)
Neutrophils Relative %: 63 %
Platelets: 212 10*3/uL (ref 150–400)
RBC: 3.6 MIL/uL — ABNORMAL LOW (ref 4.22–5.81)
RDW: 14.6 % (ref 11.5–15.5)
WBC: 4.3 10*3/uL (ref 4.0–10.5)
nRBC: 0 % (ref 0.0–0.2)

## 2020-07-02 LAB — BASIC METABOLIC PANEL
Anion gap: 10 (ref 5–15)
BUN: 22 mg/dL — ABNORMAL HIGH (ref 6–20)
CO2: 24 mmol/L (ref 22–32)
Calcium: 8.6 mg/dL — ABNORMAL LOW (ref 8.9–10.3)
Chloride: 93 mmol/L — ABNORMAL LOW (ref 98–111)
Creatinine, Ser: 1.51 mg/dL — ABNORMAL HIGH (ref 0.61–1.24)
GFR calc Af Amer: 57 mL/min — ABNORMAL LOW (ref >60)
GFR calc non Af Amer: 49 mL/min — ABNORMAL LOW (ref >60)
Glucose, Bld: 133 mg/dL — ABNORMAL HIGH (ref 70–99)
Potassium: 4.1 mmol/L (ref 3.5–5.1)
Sodium: 127 mmol/L — ABNORMAL LOW (ref 135–145)

## 2020-07-02 LAB — MAGNESIUM: Magnesium: 1.7 mg/dL (ref 1.7–2.4)

## 2020-07-02 NOTE — Progress Notes (Signed)
PROGRESS NOTE    Brad [name]   VQQ:[id number]  DOB: May 17, 1961  DOA: 06/30/2020     1  PCP: Brad Rakes, MD  CC: right chest pain  Hospital Course: Brad [name] is a 59 yo AA male with PMH sCHF, PAF, HTN, chronic hypoNa, EtOH use, HCV with cirrhosis, depression who presented with right-sided chest pain.  It was reported that he had a mechanical fall approximately 2 weeks prior to hospitalization and landed on his right side.  He has denied hitting his head multiple times when asked. He was evaluated at urgent care on 06/27/2020 and discharged home with tramadol for further recovery.  Due to worsening pain, he presented for further evaluation. CXR obtained in the ER revealed a right-sided rib fracture involving the fifth and sixth ribs.  He also was hyponatremic, 116.  He has had several hospitalizations over the past 6 months. He was admitted for fluid restriction and IV Lasix for volume overload. He responded well to fluid restriction and Lasix with adequate urine output.  The morning following admission, he states that his breathing and lower extremity edema had also improved.   Interval History:  Admitted after having worsening right-sided chest wall pain at home.  Informed him that he has 2 rib fractures which are contributing to his pain and likely to heal slowly.   No other events or acute issues overnight.  Sitting up in bed comfortable this morning with ongoing rib pain as expected.  Otherwise states his breathing feels more comfortable his morning.  Old records reviewed in assessment of this patient  ROS: Constitutional: negative for chills, Respiratory: negative for cough, Cardiovascular: negative for chest pain and Gastrointestinal: negative for abdominal pain  Assessment & Plan: Hyponatremia -This is considered acute on chronic due to CHF, ongoing alcohol use, and possibly SIADH from recent illness/trauma/pain -Continue Lasix -Continue fluid restriction -Trend BMP  and will further treat as needed  DM type 2 (diabetes mellitus, type 2) (HCC) -Continue SSI and POC glucose  Chronic atrial fibrillation -Continue amiodarone -Patient states only takes aspirin at home -Continue Lovenox for DVT prophylaxis for now  Chronic systolic CHF (congestive heart failure) (HCC) -Continue Lasix -trend BMP -Monitor I&O  Essential hypertension -Continue current regimen  CKD (chronic kidney disease) stage 3, GFR 30-59 ml/min -Baseline creatinine around 1.4.  Currently at baseline -Avoid nephrotoxic agents as able   Antimicrobials: None  DVT prophylaxis: Lovenox Code Status: Full Family Communication: None Disposition Plan:  Status is: Inpatient  Remains inpatient appropriate because:Persistent severe electrolyte disturbances, Ongoing active pain requiring inpatient pain management, Unsafe d/c plan, IV treatments appropriate due to intensity of illness or inability to take PO and Inpatient level of care appropriate due to severity of illness   Dispo: The patient is from: Home              Anticipated d/c is to: Pending PT eval              Anticipated d/c date is: 1 day              Patient currently is not medically stable to d/c.   Objective: Blood pressure (!) 98/56, pulse 90, temperature 97.9 F (36.6 C), temperature source Oral, resp. rate 16, height 5\' 11"  (1.803 m), weight 78.7 kg, SpO2 99 %.  Examination: General appearance: alert, cooperative, no distress and slowed mentation Head: Normocephalic, without obvious abnormality Eyes: EOMI Lungs: Scattered coarse breath sounds bilaterally Chest wall: right sided chest wall tenderness Heart: regular  rate and rhythm and S1, S2 normal Abdomen: normal findings: bowel sounds normal and soft, non-tender Extremities: Improved edema, now 1+ bilateral lower extremity pitting Skin: mobility and turgor normal Neurologic: Grossly normal   Consultants:   None  Procedures:   None  Data Reviewed:  I have personally reviewed following labs and imaging studies Results for orders placed or performed during the hospital encounter of 06/30/20 (from the past 24 hour(s))  Basic metabolic panel     Status: Abnormal   Collection Time: 07/02/20  5:41 AM  Result Value Ref Range   Sodium 127 (L) 135 - 145 mmol/L   Potassium 4.1 3.5 - 5.1 mmol/L   Chloride 93 (L) 98 - 111 mmol/L   CO2 24 22 - 32 mmol/L   Glucose, Bld 133 (H) 70 - 99 mg/dL   BUN 22 (H) 6 - 20 mg/dL   Creatinine, Ser 1.51 (H) 0.61 - 1.24 mg/dL   Calcium 8.6 (L) 8.9 - 10.3 mg/dL   GFR calc non Af Amer 49 (L) >60 mL/min   GFR calc Af Amer 57 (L) >60 mL/min   Anion gap 10 5 - 15  CBC with Differential/Platelet     Status: Abnormal   Collection Time: 07/02/20  5:41 AM  Result Value Ref Range   WBC 4.3 4.0 - 10.5 K/uL   RBC 3.60 (L) 4.22 - 5.81 MIL/uL   Hemoglobin 10.8 (L) 13.0 - 17.0 g/dL   HCT 31.8 (L) 39 - 52 %   MCV 88.3 80.0 - 100.0 fL   MCH 30.0 26.0 - 34.0 pg   MCHC 34.0 30.0 - 36.0 g/dL   RDW 14.6 11.5 - 15.5 %   Platelets 212 150 - 400 K/uL   nRBC 0.0 0.0 - 0.2 %   Neutrophils Relative % 63 %   Neutro Abs 2.7 1.7 - 7.7 K/uL   Lymphocytes Relative 10 %   Lymphs Abs 0.4 (L) 0.7 - 4.0 K/uL   Monocytes Relative 24 %   Monocytes Absolute 1.1 (H) 0 - 1 K/uL   Eosinophils Relative 2 %   Eosinophils Absolute 0.1 0 - 0 K/uL   Basophils Relative 1 %   Basophils Absolute 0.0 0 - 0 K/uL   Immature Granulocytes 0 %   Abs Immature Granulocytes 0.01 0.00 - 0.07 K/uL  Magnesium     Status: None   Collection Time: 07/02/20  5:41 AM  Result Value Ref Range   Magnesium 1.7 1.7 - 2.4 mg/dL   *Note: Due to a large number of results and/or encounters for the requested time period, some results have not been displayed. A complete set of results can be found in Results Review.    Recent Results (from the past 240 hour(s))  SARS Coronavirus 2 by RT PCR (hospital order, performed in [facility] hospital lab) Nasopharyngeal  Nasopharyngeal Swab     Status: None   Collection Time: 06/30/20  9:46 AM   Specimen: Nasopharyngeal Swab  Result Value Ref Range Status   SARS Coronavirus 2 NEGATIVE NEGATIVE Final    Comment: (NOTE) SARS-CoV-2 target nucleic acids are NOT DETECTED.  The SARS-CoV-2 RNA is generally detectable in upper and lower respiratory specimens during the acute phase of infection. The lowest concentration of SARS-CoV-2 viral copies this assay can detect is 250 copies / mL. A negative result does not preclude SARS-CoV-2 infection and should not be used as the sole basis for treatment or other patient management decisions.  A negative result may occur  with improper specimen collection / handling, submission of specimen other than nasopharyngeal swab, presence of viral mutation(s) within the areas targeted by this assay, and inadequate number of viral copies (<250 copies / mL). A negative result must be combined with clinical observations, patient history, and epidemiological information.  Fact Sheet for Patients:   StrictlyIdeas.no  Fact Sheet for Healthcare Providers: BankingDealers.co.za  This test is not yet approved or  cleared by the [place] FDA and has been authorized for detection and/or diagnosis of SARS-CoV-2 by FDA under an Emergency Use Authorization (EUA).  This EUA will remain in effect (meaning this test can be used) for the duration of the COVID-19 declaration under Section 564(b)(1) of the Act, 21 U.S.C. section 360bbb-3(b)(1), unless the authorization is terminated or revoked sooner.  Performed at [facility], [place] [address] [place], [place] [address]      Radiology Studies: No results found. DG Ribs Unilateral W/Chest Right  Final Result       Scheduled Meds: . amiodarone  200 mg Oral Daily  . atorvastatin  20 mg Oral q1800  . folic acid  1 mg Oral Daily  . furosemide  40 mg Intravenous  BID  . glipiZIDE  2.5 mg Oral QAC breakfast  . isosorbide-hydrALAZINE  0.5 tablet Oral TID  . lidocaine  1 patch Transdermal Q24H  . spironolactone  25 mg Oral Daily   PRN Meds: bisacodyl, HYDROcodone-acetaminophen, methocarbamol, ondansetron **OR** ondansetron (ZOFRAN) IV Continuous Infusions:    LOS: 1 day  Time spent: Greater than 50% of the 35 minute visit was spent in counseling/coordination of care for the patient as laid out in the A&P.   Dwyane Dee, MD [facility] 07/02/2020, 2:02 PM   Contact via secure chat.  To contact the attending provider between 7A-7P or the covering provider during after hours 7P-7A, please log into the web site [email or web address] and access using universal [place] password for that web site. If you do not have the password, please call the hospital operator.

## 2020-07-03 ENCOUNTER — Telehealth (HOSPITAL_COMMUNITY): Payer: Self-pay | Admitting: Licensed Clinical Social Worker

## 2020-07-03 LAB — CBC WITH DIFFERENTIAL/PLATELET
Abs Immature Granulocytes: 0.01 10*3/uL (ref 0.00–0.07)
Basophils Absolute: 0 10*3/uL (ref 0.0–0.1)
Basophils Relative: 0 %
Eosinophils Absolute: 0.1 10*3/uL (ref 0.0–0.5)
Eosinophils Relative: 2 %
HCT: 32.3 % — ABNORMAL LOW (ref 39.0–52.0)
Hemoglobin: 10.7 g/dL — ABNORMAL LOW (ref 13.0–17.0)
Immature Granulocytes: 0 %
Lymphocytes Relative: 7 %
Lymphs Abs: 0.4 10*3/uL — ABNORMAL LOW (ref 0.7–4.0)
MCH: 29.9 pg (ref 26.0–34.0)
MCHC: 33.1 g/dL (ref 30.0–36.0)
MCV: 90.2 fL (ref 80.0–100.0)
Monocytes Absolute: 0.8 10*3/uL (ref 0.1–1.0)
Monocytes Relative: 16 %
Neutro Abs: 3.6 10*3/uL (ref 1.7–7.7)
Neutrophils Relative %: 75 %
Platelets: 201 10*3/uL (ref 150–400)
RBC: 3.58 MIL/uL — ABNORMAL LOW (ref 4.22–5.81)
RDW: 14.5 % (ref 11.5–15.5)
WBC: 4.8 10*3/uL (ref 4.0–10.5)
nRBC: 0 % (ref 0.0–0.2)

## 2020-07-03 LAB — BASIC METABOLIC PANEL
Anion gap: 13 (ref 5–15)
BUN: 26 mg/dL — ABNORMAL HIGH (ref 6–20)
CO2: 23 mmol/L (ref 22–32)
Calcium: 8.4 mg/dL — ABNORMAL LOW (ref 8.9–10.3)
Chloride: 93 mmol/L — ABNORMAL LOW (ref 98–111)
Creatinine, Ser: 1.59 mg/dL — ABNORMAL HIGH (ref 0.61–1.24)
GFR calc Af Amer: 54 mL/min — ABNORMAL LOW (ref >60)
GFR calc non Af Amer: 46 mL/min — ABNORMAL LOW (ref >60)
Glucose, Bld: 152 mg/dL — ABNORMAL HIGH (ref 70–99)
Potassium: 4 mmol/L (ref 3.5–5.1)
Sodium: 129 mmol/L — ABNORMAL LOW (ref 135–145)

## 2020-07-03 LAB — MAGNESIUM: Magnesium: 1.7 mg/dL (ref 1.7–2.4)

## 2020-07-03 MED ORDER — HYDROCODONE-ACETAMINOPHEN 5-325 MG PO TABS: 1.0000 | ORAL_TABLET | Freq: Four times a day (QID) | ORAL | 0 refills | Status: AC | PRN

## 2020-07-03 NOTE — Telephone Encounter (Signed)
CSW received call from pt that he is discharging from the hospital but was unable to afford a new medication that was prescribed- reported it would be $6.   Pt was able to get a ride by the clinic and CSW provided with money to assist with purchasing medication.  Also able to give pt new air mattress and blanket as pt has been sleeping on the floor.  No further needs at this time- will continue to follow and assist as needed  [name], [place] Worker [facility] Desk#: [phone] Cell#: [phone]

## 2020-07-03 NOTE — Progress Notes (Signed)
Pt to be discharged to home this afternoon. Pt given discharge teaching including all Medications and schedules for these Medications reviewed with the Pt. Pt verbalized understanding of all discharge teaching. Discharge packet with Pt at time of discharge

## 2020-07-03 NOTE — Discharge Summary (Signed)
Physician Discharge Summary  Brad [name] VFI:[id number] DOB: [birth year] DOA: 06/30/2020  PCP: Brad Rakes, MD  Admit date: 06/30/2020 Discharge date: 07/03/2020  Admitted From: Home Disposition: Home  Recommendations for Outpatient Follow-up:  1. Follow up with PCP in 1-2 weeks 2. Please obtain BMP/CBC in one week  Discharge Condition: Stable CODE STATUS: Full Diet recommendation: As tolerated  Brief/Interim Summary: Brad [name] is a 59 yo AA male with PMH sCHF, PAF, HTN, chronic hypoNa, EtOH use, HCV with cirrhosis, depression who presented with right-sided chest pain.  It was reported that he had a mechanical fall approximately 2 weeks prior to hospitalization and landed on his right side.  He has denied hitting his head multiple times when asked. He was evaluated at urgent care on 06/27/2020 and discharged home with tramadol for further recovery.  Due to worsening pain, he presented for further evaluation. CXR obtained in the ER revealed a right-sided rib fracture involving the fifth and sixth ribs.  He also was hyponatremic, 116.  He has had several hospitalizations over the past 6 months. He was admitted for fluid restriction and IV Lasix for volume overload. He responded well to fluid restriction and Lasix with adequate urine output.  The morning following admission, he states that his breathing and lower extremity edema had also improved.  Patient met as above with acute complaints of chest pain after fall found to have notable rib fractures.  Pain somewhat well controlled today on hydrocodone, previously requiring IV morphine.  Patient also noted to have hyponatremia, appears to be acute on chronic given alcohol abuse and SIADH which resolved with Lasix and fluid restriction.  Patient's other chronic comorbid conditions appear well controlled, patient recommended to have close follow-up with PCP in the next 1 to 2 weeks for further evaluation and treatment.  No medication changes at  discharge other than 3-day course of hydrocodone for rib pain.  Discharge Diagnoses:  Principal Problem:   Hyponatremia Active Problems:   Essential hypertension   DM type 2 (diabetes mellitus, type 2) (HCC)   CKD (chronic kidney disease) stage 3, GFR 30-59 ml/min   Chronic atrial fibrillation   Chronic systolic CHF (congestive heart failure) [facility])    Discharge Instructions  Discharge Instructions    Call MD for:  difficulty breathing, headache or visual disturbances   Complete by: As directed    Call MD for:  difficulty breathing, headache or visual disturbances   Complete by: As directed    Call MD for:  extreme fatigue   Complete by: As directed    Call MD for:  extreme fatigue   Complete by: As directed    Call MD for:  hives   Complete by: As directed    Call MD for:  hives   Complete by: As directed    Call MD for:  persistant dizziness or light-headedness   Complete by: As directed    Call MD for:  persistant dizziness or light-headedness   Complete by: As directed    Call MD for:  persistant nausea and vomiting   Complete by: As directed    Call MD for:  persistant nausea and vomiting   Complete by: As directed    Call MD for:  severe uncontrolled pain   Complete by: As directed    Call MD for:  severe uncontrolled pain   Complete by: As directed    Call MD for:  temperature >100.4   Complete by: As directed    Call MD for:  temperature >100.4   Complete by: As directed    Diet - low sodium heart healthy   Complete by: As directed    Increase activity slowly   Complete by: As directed    Increase activity slowly   Complete by: As directed      Allergies as of 07/03/2020      Reactions   Angiotensin Receptor Blockers Anaphylaxis, Other (See Comments)   (Angioedema also with Lisinopril, therefore ARB's are contraindicated)   Lisinopril Anaphylaxis, Swelling   Throat swelling   Pamelor [nortriptyline Hcl] Anaphylaxis, Swelling   Throat swells       Medication List    STOP taking these medications   escitalopram 10 MG tablet Commonly known as: LEXAPRO   HYDROcodone-homatropine 5-1.5 MG/5ML syrup Commonly known as: Hycodan   hydrOXYzine 10 MG tablet Commonly known as: ATARAX/VISTARIL     TAKE these medications   amiodarone 200 MG tablet Commonly known as: PACERONE Take 1 tablet (200 mg total) by mouth daily.   aspirin 81 MG EC tablet Take 1 tablet (81 mg total) by mouth daily. Swallow whole.   atorvastatin 20 MG tablet Commonly known as: LIPITOR Take 1 tablet (20 mg total) by mouth daily at 6 PM.   blood glucose meter kit and supplies Kit Dispense based on patient and insurance preference. Use up to four times daily as directed. (FOR ICD-9 250.00, 250.01).   folic acid 1 MG tablet Commonly known as: FOLVITE Take 1 tablet (1 mg total) by mouth daily.   gabapentin 300 MG capsule Commonly known as: NEURONTIN Take 2 capsules (600 mg total) by mouth at bedtime.   glipiZIDE 5 MG tablet Commonly known as: GLUCOTROL Take 0.5 tablets (2.5 mg total) by mouth daily before breakfast.   HYDROcodone-acetaminophen 5-325 MG tablet Commonly known as: NORCO/VICODIN Take 1-2 tablets by mouth every 6 (six) hours as needed for up to 3 days for severe pain.   isosorbide-hydrALAZINE 20-37.5 MG tablet Commonly known as: BIDIL Take 0.5 tablets by mouth 3 (three) times daily.   lidocaine 5 % Commonly known as: LIDODERM Place 1 patch onto the skin daily. Remove & Discard patch within 12 hours or as directed by MD   methocarbamol 500 MG tablet Commonly known as: ROBAXIN Take 1 tablet (500 mg total) by mouth every 6 (six) hours as needed for muscle spasms.   multivitamin with minerals Tabs tablet Take 1 tablet by mouth daily.   sodium chloride 1 g tablet Take 2 tablets (2 g total) by mouth daily.   spironolactone 25 MG tablet Commonly known as: ALDACTONE Take 1 tablet (25 mg total) by mouth daily.   thiamine 100 MG  tablet Take 1 tablet (100 mg total) by mouth daily.   torsemide 20 MG tablet Commonly known as: DEMADEX Take 20 mg by mouth daily.   traMADol 50 MG tablet Commonly known as: ULTRAM Take 1 tablet (50 mg total) by mouth every 8 (eight) hours as needed for moderate pain.       Allergies  Allergen Reactions  . Angiotensin Receptor Blockers Anaphylaxis and Other (See Comments)    (Angioedema also with Lisinopril, therefore ARB's are contraindicated)  . Lisinopril Anaphylaxis and Swelling    Throat swelling  . Pamelor [Nortriptyline Hcl] Anaphylaxis and Swelling    Throat swells    Procedures/Studies: DG Chest 2 View  Result Date: 06/10/2020 CLINICAL DATA:  Central nonradiating chest pain. EXAM: CHEST - 2 VIEW COMPARISON:  Radiograph 05/29/2020. FINDINGS: Chronic cardiomegaly with unchanged mediastinal  contours. Aortic atherosclerosis. No significant pleural effusion. Mild vascular congestion without pulmonary edema. No focal airspace disease. No pneumothorax. No acute osseous abnormalities are seen. IMPRESSION: Chronic cardiomegaly with mild vascular congestion. Electronically Signed   By: Keith Rake M.D.   On: 06/10/2020 00:18   DG Ribs Unilateral W/Chest Right  Result Date: 06/30/2020 CLINICAL DATA:  Rib pain, fall 1-2 weeks ago. EXAM: RIGHT RIBS AND CHEST - 3+ VIEW COMPARISON:  Chest x-rays dated 06/21/2020 and 05/29/2020. Chest CT dated 06/21/2020. FINDINGS: Stable cardiomegaly. Lungs are clear. No pleural effusion or pneumothorax is seen. Nondisplaced healing fracture of the RIGHT anterior sixth rib. Questionable acute slightly displaced fracture of the RIGHT anterior-lateral fifth rib. Osseous structures about the chest are otherwise unremarkable. IMPRESSION: 1. Healing/subacute nondisplaced fracture of the RIGHT anterior sixth rib. Questionable acute slightly displaced fracture of the RIGHT anterior-lateral fifth rib. 2. Stable cardiomegaly. No evidence of pneumonia or  pulmonary edema. No pleural effusion or pneumothorax is seen. Electronically Signed   By: Franki Cabot M.D.   On: 06/30/2020 09:09   CARDIAC CATHETERIZATION  Result Date: 06/04/2020  Ost Ramus to Ramus lesion is 25% stenosed.  Prox Cx to Mid Cx lesion is 50% stenosed.  Ost LM lesion is 30% stenosed.  Findings: Ao = 119/68 (88) LV = 116/12 RA = 13 RV = 53/12 PA = 54/20 (33) PCW = 22 (v = 35) Fick cardiac output/index = 4.7/2.3 PVR = 2.2 WU Ao sat = 97% PA sat = 57%, 57% Assessment: 1. Mild non-obstructive CAD with unusual origin and course of RCA 2. NICM EF 20% 3. Mild to moderately elevated biventricular filling pressures 4. Mildly reduced CO Plan/Discussion: Continue medical therapy including PVC suppression and ETOH cessation. Glori Bickers, MD 9:28 AM   DG Chest Portable 1 View  Result Date: 06/21/2020 CLINICAL DATA:  Chest pain EXAM: PORTABLE CHEST 1 VIEW COMPARISON:  06/10/2020 FINDINGS: Cardiomegaly. There are a few Kerley lines not seen previously. No visible effusion or pneumothorax IMPRESSION: Cardiomegaly and mild interstitial edema. Electronically Signed   By: Monte Fantasia M.D.   On: 06/21/2020 06:49   CT Angio Chest/Abd/Pel for Dissection W and/or Wo Contrast  Result Date: 06/21/2020 CLINICAL DATA:  Abdominal pain with aortic dissection suspected. Sudden onset left lower quadrant pain EXAM: CT ANGIOGRAPHY CHEST, ABDOMEN AND PELVIS TECHNIQUE: Non-contrast CT of the chest was initially obtained. Multidetector CT imaging through the chest, abdomen and pelvis was performed using the standard protocol during bolus administration of intravenous contrast. Multiplanar reconstructed images and MIPs were obtained and reviewed to evaluate the vascular anatomy. CONTRAST:  168m OMNIPAQUE IOHEXOL 350 MG/ML SOLN COMPARISON:  12/31/2019 abdominal CTA FINDINGS: CTA CHEST FINDINGS Cardiovascular: No intramural hematoma in the aorta on the noncontrast phase. Extensive aortic and coronary  atherosclerotic calcification. Preferential opacification of the thoracic aorta. No evidence of thoracic aortic aneurysm or dissection. Enlarged heart size. No pericardial effusion. Mediastinum/Nodes: Negative for adenopathy or mass Lungs/Pleura: Interlobular septal thickening at the bases with trace right pleural effusion. Negative for consolidation. Musculoskeletal: No acute or aggressive finding Review of the MIP images confirms the above findings. CTA ABDOMEN AND PELVIS FINDINGS VASCULAR Aorta: Diffuse atheromatous wall thickening with extensive calcification. No aneurysm or dissection. Celiac: Atheromatous calcification at the ostium. No branch occlusion or beading. SMA: Atheromatous plaque at the ostium. No branch occlusion or beading. Renals: Atherosclerotic plaque at the ostia with mild narrowing on the right. No beading or visible aneurysm. IMA: Patent Inflow: Diffuse atheromatous plaque. Negative for aneurysm or  dissection. Veins: Negative in the arterial phase Review of the MIP images confirms the above findings. NON-VASCULAR Hepatobiliary: Cirrhotic liver morphology. No hypervascular mass.No evidence of biliary obstruction or stone. Pancreas: Unremarkable. Spleen: Unremarkable. Adrenals/Urinary Tract: Negative adrenals. No hydronephrosis or stone. Small interpolar left renal cystic density. Unremarkable bladder. Stomach/Bowel:  No obstruction. No evidence of bowel inflammation. Lymphatic: No mass or adenopathy. Reproductive:No pathologic findings. Other: No ascites or pneumoperitoneum. Musculoskeletal: No acute abnormalities. Review of the MIP images confirms the above findings. IMPRESSION: 1. No evidence of acute aortic syndrome. 2. Cardiomegaly and interstitial edema. 3. Cirrhosis. 4. Aortic Atherosclerosis (ICD10-I70.0). Electronically Signed   By: Monte Fantasia M.D.   On: 06/21/2020 09:53     Subjective: No acute issues or events overnight   Discharge Exam: Vitals:   07/02/20 2032 07/03/20  0355  BP: 113/78 127/80  Pulse: 71 75  Resp: 18 20  Temp: 97.9 F (36.6 C) (!) 97.5 F (36.4 C)  SpO2: 100% 100%   Vitals:   07/02/20 1259 07/02/20 2032 07/03/20 0355 07/03/20 0500  BP: (!) 98/56 113/78 127/80   Pulse: 90 71 75   Resp: _0 Temp: 97.9 F (36.6 C) 97.9 F (36.6 C) (!) 97.5 F (36.4 C)   TempSrc: Oral  Oral   SpO2: 99% 100% 100%   Weight:    72.8 kg  Height:        General: Pt is alert, awake, not in acute distress Cardiovascular: RRR, S1/S2 +, no rubs, no gallops Respiratory: CTA bilaterally, no wheezing, no rhonchi Abdominal: Soft, NT, ND, bowel sounds + Extremities: no edema, no cyanosis    The results of significant diagnostics from this hospitalization (including imaging, microbiology, ancillary and laboratory) are listed below for reference.     Microbiology: Recent Results (from the past 240 hour(s))  SARS Coronavirus 2 by RT PCR (hospital order, performed in [facility] hospital lab) Nasopharyngeal Nasopharyngeal Swab     Status: None   Collection Time: 06/30/20  9:46 AM   Specimen: Nasopharyngeal Swab  Result Value Ref Range Status   SARS Coronavirus 2 NEGATIVE NEGATIVE Final    Comment: (NOTE) SARS-CoV-2 target nucleic acids are NOT DETECTED.  The SARS-CoV-2 RNA is generally detectable in upper and lower respiratory specimens during the acute phase of infection. The lowest concentration of SARS-CoV-2 viral copies this assay can detect is 250 copies / mL. A negative result does not preclude SARS-CoV-2 infection and should not be used as the sole basis for treatment or other patient management decisions.  A negative result may occur with improper specimen collection / handling, submission of specimen other than nasopharyngeal swab, presence of viral mutation(s) within the areas targeted by this assay, and inadequate number of viral copies (<250 copies / mL). A negative result must be combined with clinical observations, patient  history, and epidemiological information.  Fact Sheet for Patients:   StrictlyIdeas.no  Fact Sheet for Healthcare Providers: BankingDealers.co.za  This test is not yet approved or  cleared by the [place] FDA and has been authorized for detection and/or diagnosis of SARS-CoV-2 by FDA under an Emergency Use Authorization (EUA).  This EUA will remain in effect (meaning this test can be used) for the duration of the COVID-19 declaration under Section 564(b)(1) of the Act, 21 U.S.C. section 360bbb-3(b)(1), unless the authorization is terminated or revoked sooner.  Performed at [facility], [place] [address] [place], [place] [address]      Labs: BNP (last 3  results) Recent Labs    05/29/20 0430 06/21/20 0626 06/30/20 0657  BNP 678.3* 584.3* 291.9*   Basic Metabolic Panel: Recent Labs  Lab 06/30/20 0657 06/30/20 1404 07/01/20 0547 07/02/20 0541 07/03/20 0604  NA 116* 122* 124* 127* 129*  K 4.5 4.7 4.6 4.1 4.0  CL 85* 84* 87* 93* 93*  CO2 _0 GLUCOSE 154* 148* 148* 133* 152*  BUN 18 19 23* 22* 26*  CREATININE 1.30* 1.34*  1.28* 1.45* 1.51* 1.59*  CALCIUM 8.4* 8.7* 8.7* 8.6* 8.4*  MG  --   --   --  1.7 1.7   Liver Function Tests: Recent Labs  Lab 07/01/20 0547  AST 24  ALT 19  ALKPHOS 60  BILITOT 0.7  PROT 7.1  ALBUMIN 3.2*   No results for input(s): LIPASE, AMYLASE in the last 168 hours. No results for input(s): AMMONIA in the last 168 hours. CBC: Recent Labs  Lab 06/30/20 0657 06/30/20 1404 07/02/20 0541 07/03/20 0604  WBC 5.0 5.1 4.3 4.8  NEUTROABS 3.6  --  2.7 3.6  HGB 11.5* 12.0* 10.8* 10.7*  HCT 33.5* 34.4* 31.8* 32.3*  MCV 87.5 88.0 88.3 90.2  PLT 223 251 212 201   Cardiac Enzymes: No results for input(s): CKTOTAL, CKMB, CKMBINDEX, TROPONINI in the last 168 hours. BNP: Invalid input(s): POCBNP CBG: Recent Labs  Lab 06/30/20 1735 06/30/20 1939  GLUCAP  177* 187*   D-Dimer No results for input(s): DDIMER in the last 72 hours. Hgb A1c No results for input(s): HGBA1C in the last 72 hours. Lipid Profile No results for input(s): CHOL, HDL, LDLCALC, TRIG, CHOLHDL, LDLDIRECT in the last 72 hours. Thyroid function studies Recent Labs    06/30/20 1404  TSH 4.596*   Anemia work up No results for input(s): VITAMINB12, FOLATE, FERRITIN, TIBC, IRON, RETICCTPCT in the last 72 hours. Urinalysis    Component Value Date/Time   COLORURINE STRAW (A) 03/30/2020 1928   APPEARANCEUR CLEAR 03/30/2020 1928   LABSPEC 1.002 (L) 03/30/2020 1928   PHURINE 6.0 03/30/2020 1928   GLUCOSEU NEGATIVE 03/30/2020 1928   HGBUR NEGATIVE 03/30/2020 1928   BILIRUBINUR NEGATIVE 03/30/2020 1928   BILIRUBINUR neg 05/08/2017 1503   KETONESUR NEGATIVE 03/30/2020 1928   PROTEINUR 30 (A) 03/30/2020 1928   UROBILINOGEN 0.2 05/08/2017 1503   UROBILINOGEN 1.0 04/26/2012 1328   NITRITE NEGATIVE 03/30/2020 1928   LEUKOCYTESUR NEGATIVE 03/30/2020 1928   Sepsis Labs Invalid input(s): PROCALCITONIN,  WBC,  LACTICIDVEN Microbiology Recent Results (from the past 240 hour(s))  SARS Coronavirus 2 by RT PCR (hospital order, performed in [place] hospital lab) Nasopharyngeal Nasopharyngeal Swab     Status: None   Collection Time: 06/30/20  9:46 AM   Specimen: Nasopharyngeal Swab  Result Value Ref Range Status   SARS Coronavirus 2 NEGATIVE NEGATIVE Final    Comment: (NOTE) SARS-CoV-2 target nucleic acids are NOT DETECTED.  The SARS-CoV-2 RNA is generally detectable in upper and lower respiratory specimens during the acute phase of infection. The lowest concentration of SARS-CoV-2 viral copies this assay can detect is 250 copies / mL. A negative result does not preclude SARS-CoV-2 infection and should not be used as the sole basis for treatment or other patient management decisions.  A negative result may occur with improper specimen collection / handling, submission of  specimen other than nasopharyngeal swab, presence of viral mutation(s) within the areas targeted by this assay, and inadequate number of viral copies (<250 copies / mL). A negative result must  be combined with clinical observations, patient history, and epidemiological information.  Fact Sheet for Patients:   StrictlyIdeas.no  Fact Sheet for Healthcare Providers: BankingDealers.co.za  This test is not yet approved or  cleared by the [place] FDA and has been authorized for detection and/or diagnosis of SARS-CoV-2 by FDA under an Emergency Use Authorization (EUA).  This EUA will remain in effect (meaning this test can be used) for the duration of the COVID-19 declaration under Section 564(b)(1) of the Act, 21 U.S.C. section 360bbb-3(b)(1), unless the authorization is terminated or revoked sooner.  Performed at [facility], [place] [address], [place], [place] [address]      Time coordinating discharge: Over 30 minutes  SIGNED:   Little Ishikawa, DO [facility] 07/03/2020, 1:07 PM

## 2020-07-04 ENCOUNTER — Telehealth: Payer: Self-pay

## 2020-07-04 NOTE — Telephone Encounter (Signed)
Transition Care Management Follow-up Telephone Call  Date of discharge and from where: 07/03/2020, [name].   How have you been since you were released from the hospital? Brad [name] is upset about his housing conditions and is  focused on moving from his current apartment due to multiple issues, including bed bugs and malfunction of air conditioning.   Any questions or concerns?  [name] said that [name] went to the [name] this morning and was told that there is a long wait list.   This CM contacted  the [facility] and was eventually able to speak to [name] # [phone].  This CM explained that the patient does not have his birth certificate and his name has been changed since birth.  Brad [name] will have difficulty obtaining and affording the copy of the birth certificate as well as the documentation of his name change.  Brad [name] currently receives disability and has [name] under his current name.  Brad [name] said that Brad [name] should contact [name] and request a letter stating that Brad [name] is a [name] citizen and include his date of birth. This can be submitted in lieu of an updated birth certificate and name change documentation.   She said that Brad [name] needs to submit that to the [name] along with his social security card, picture ID and benefits letter.  She explained that [name] has been approved for the 1 bedroom apt at [name]. There is a wait list and no available unit yet. [name] will need to have a background screening prior to being placed on the wait list. She said that the wait could be a couple of months.  She also explained that the cost of the apt would be 30% of his income  - about $230/month.   This CM explained all of the above conversation with [name] to the patient as well as Brad Sours, LCSW.  The patient called this CM back later  In the day and said that [name] had contacted [name] and they will be sending the letter that is needed in addition to the benefits letter. It will be sent to  his mailbox at [name]. [name] said that his current name is on the social security card and his picture ID.   Brad [name] was frustrated that there is a wait even after Brad [name] obtains these documents. Brad [name] said that Brad [name] and his roommate are considering moving into a motel in the meantime in order to get out of their current living situation.   Items Reviewed:  Did the pt receive and understand the discharge instructions provided?yes  Medications obtained and verified?  Brad [name] said that [name] has all medications and did not have any questions. Brad [name] has been managing his medications with the assistance of his roommate and [name], [name]  Any new allergies since your discharge?  none reported   Do you have support at home?  has a roommate, [name] community paramedicine services  Has scale  No home health ordered  Has walker and cane  Functional Questionnaire: (I = Independent and D = Dependent) ADLs:independent.  His roommate provides needed assistance.   Follow up appointments reviewed:   PCP Hospital f/u appt confirmed?[name] had an appointment with [name] tomorrow- 07/05/2020 but said [name] had to change it to 08/06/2020 due to transportation problems  [facility] f/u appt confirmed?needs to schedule follow up with cardiology  Are transportation arrangements needed? his friend, [name], provides transportation. Brad [name] is also eligible for [name] and [name] transportation  If their condition worsens, is the pt aware to call PCP or go to the Emergency Dept.?  yes  Was the patient provided with contact information for the PCP's office or ED?  [name] has the phone number for this clinic  Was to pt encouraged to call back with questions or concerns? yes

## 2020-07-05 ENCOUNTER — Telehealth: Payer: Self-pay

## 2020-07-05 ENCOUNTER — Ambulatory Visit: Payer: Medicaid Other | Admitting: Family Medicine

## 2020-07-05 NOTE — Telephone Encounter (Signed)
Call received from patient noting that the air conditioner in his apartment is not working. He found out from the landlord that he and his roommate owe back rent. The patient was unaware of this as he said he has been paying the roommate money for the rent each month and thought it was current. He said he has contacted Tammy Sours, LCSW for assistance with addressing the issue of lack of air conditioning

## 2020-07-09 ENCOUNTER — Telehealth: Payer: Self-pay

## 2020-07-09 ENCOUNTER — Other Ambulatory Visit (HOSPITAL_COMMUNITY): Payer: Self-pay

## 2020-07-09 NOTE — Progress Notes (Signed)
Paramedicine Encounter    Patient ID: Brad [name], male    DOB: Feb 18, 1961, 59 y.o.   MRN: [id number]   Patient Care Team: Charlott Rakes, MD as PCP - General (Family Medicine) Debara Pickett Nadean Corwin, MD as PCP - Cardiology (Cardiology) Charlott Rakes, MD (Family Medicine) Charlott Rakes, MD (Family Medicine)  Patient Active Problem List   Diagnosis Date Noted  . Acute exacerbation of CHF (congestive heart failure) ([place]) 05/29/2020  . Chronic combined systolic and diastolic CHF (congestive heart failure) ([place]) 04/19/2020  . Alcohol abuse with intoxication ([place]) 03/23/2020  . Hyponatremia 03/23/2020  . Fall 03/23/2020  . Normocytic anemia 03/23/2020  . Leukopenia 03/23/2020  . HTN (hypertension) 03/23/2020  . HLD (hyperlipidemia) 03/23/2020  . AKI (acute kidney injury) ([place]) 03/23/2020  . CHF (congestive heart failure) ([place]) 03/23/2020  . Alcohol abuse with intoxication ([place]) 03/05/2020  . Chronic systolic CHF (congestive heart failure) ([place]) 10/31/2019  . Acute systolic CHF (congestive heart failure) ([place]) 08/02/2019  . Diarrhea 07/29/2019  . Gastroenteritis 07/22/2019  . Hypomagnesemia 06/19/2019  . Chest pain 06/07/2019  . Elevated troponin 05/23/2019  . Tobacco abuse 05/23/2019  . Alcohol abuse 02/21/2019  . Frequent falls 01/17/2019  . Severe mitral regurgitation   . Fall 11/01/2018  . Hyponatremia 10/31/2018  . Chronic atrial fibrillation   . Chronic hyponatremia 08/16/2018  . NSVT (nonsustained ventricular tachycardia) ([place])   . Concussion with loss of consciousness   . Scalp laceration   . Trauma   . Permanent atrial fibrillation   . Hypertensive heart disease   . Shortness of breath   . Pyogenic inflammation of bone ([place])   . Cirrhosis ([place]) 03/23/2018  . Right ankle pain 03/23/2018  . Syncope 08/07/2017  . Acute on chronic combined systolic and diastolic CHF (congestive heart failure) ([place]) 07/09/2017  . Atrial flutter ([place]) 07/09/2017  . Pre-syncope  07/08/2017  . Neuropathy 05/08/2017  . Substance induced mood disorder ([place]) 10/06/2016  . CVA (cerebral vascular accident) ([place]) 09/18/2016  . Left sided numbness   . Homelessness 08/21/2016  . S/P ORIF (open reduction internal fixation) fracture 08/01/2016  . CAD (coronary artery disease), native coronary artery 07/30/2016  . Surgery, elective   . Insomnia 07/22/2016  . NSTEMI (non-ST elevated myocardial infarction) ([place])   . Anemia 07/05/2016  . Thrombocytopenia ([place]) 07/05/2016  . Cocaine abuse ([place]) 07/02/2016  . Chest pain on breathing 07/01/2016  . Essential hypertension 07/01/2016  . DM type 2 (diabetes mellitus, type 2) ([place]) 07/01/2016  . Hypokalemia 07/01/2016  . CKD (chronic kidney disease) stage 3, GFR 30-59 ml/min 07/01/2016  . Painful diabetic neuropathy ([place]) 07/01/2016  . Acute hyponatremia 05/27/2016  . Polysubstance abuse ([place]) 05/27/2016  . Chronic hepatitis C with cirrhosis ([place]) 05/27/2016  . Depression 04/21/2012  . GERD (gastroesophageal reflux disease) 02/16/2012  . History of drug abuse ([place])   . Heroin addiction ([place]) 01/29/2012    Current Outpatient Medications:  .  amiodarone (PACERONE) 200 MG tablet, Take 1 tablet (200 mg total) by mouth daily., Disp: 30 tablet, Rfl: 0 .  aspirin EC 81 MG EC tablet, Take 1 tablet (81 mg total) by mouth daily. Swallow whole. (Patient not taking: Reported on 06/25/2020), Disp: 90 tablet, Rfl: 0 .  atorvastatin (LIPITOR) 20 MG tablet, Take 1 tablet (20 mg total) by mouth daily at 6 PM., Disp: 30 tablet, Rfl: 1 .  blood glucose meter kit and supplies KIT, Dispense based on patient and insurance preference. Use up to four times daily as  directed. (FOR ICD-9 250.00, 250.01). (Patient not taking: Reported on 06/25/2020), Disp: 1 each, Rfl: 0 .  folic acid (FOLVITE) 1 MG tablet, Take 1 tablet (1 mg total) by mouth daily. (Patient not taking: Reported on 06/25/2020), Disp: 90 tablet, Rfl: 0 .  gabapentin (NEURONTIN) 300 MG capsule,  Take 2 capsules (600 mg total) by mouth at bedtime., Disp: 60 capsule, Rfl: 3 .  glipiZIDE (GLUCOTROL) 5 MG tablet, Take 0.5 tablets (2.5 mg total) by mouth daily before breakfast., Disp: 30 tablet, Rfl: 0 .  lidocaine (LIDODERM) 5 %, Place 1 patch onto the skin daily. Remove & Discard patch within 12 hours or as directed by MD, Disp: 30 patch, Rfl: 0 .  methocarbamol (ROBAXIN) 500 MG tablet, Take 1 tablet (500 mg total) by mouth every 6 (six) hours as needed for muscle spasms., Disp: 30 tablet, Rfl: 0 .  Multiple Vitamin (MULTIVITAMIN WITH MINERALS) TABS tablet, Take 1 tablet by mouth daily. (Patient not taking: Reported on 06/25/2020), Disp: 90 tablet, Rfl: 0 .  sodium chloride 1 g tablet, Take 2 tablets (2 g total) by mouth daily., Disp: 60 tablet, Rfl: 0 .  spironolactone (ALDACTONE) 25 MG tablet, Take 1 tablet (25 mg total) by mouth daily., Disp: 30 tablet, Rfl: 0 .  thiamine 100 MG tablet, Take 1 tablet (100 mg total) by mouth daily., Disp: 90 tablet, Rfl: 0 .  torsemide (DEMADEX) 20 MG tablet, Take 20 mg by mouth daily., Disp: , Rfl:  .  traMADol (ULTRAM) 50 MG tablet, Take 1 tablet (50 mg total) by mouth every 8 (eight) hours as needed for moderate pain., Disp: 14 tablet, Rfl: 0 Allergies  Allergen Reactions  . Angiotensin Receptor Blockers Anaphylaxis and Other (See Comments)    (Angioedema also with Lisinopril, therefore ARB's are contraindicated)  . Lisinopril Anaphylaxis and Swelling    Throat swelling  . Pamelor [Nortriptyline Hcl] Anaphylaxis and Swelling    Throat swells     Social History   Socioeconomic History  . Marital status: Single    Spouse name: Not on file  . Number of children: 3  . Years of education: 2y college  . Highest education level: Not on file  Occupational History  . 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 Smoking status: Current Every Day Smoker    Packs/day: 0.50    Years: 28.00     Pack years: 14.00    Types: Cigarettes  . Smokeless tobacco: Never Used  Vaping Use  . Vaping Use: Never used  Substance and Sexual Activity  . Alcohol use: Yes    Alcohol/week: 25.0 standard drinks    Types: 25 Cans of beer per week    Comment: "depends on the day"; reports not drinking every day, "I stopped about 2-3 wekes ago" - but drank yesterday  . Drug use: Not Currently    Types: IV, Cocaine, Heroin    Comment: 08/17/2018 "no heroin since 2013; I use cocaine ~ once/month, last use 03/21/2019  . Sexual activity: Not Currently  Other Topics Concern  . Not on file  Social History Narrative   ** Merged History Encounter **       ** Merged History Encounter **       Incarcerated from 2006-2010, then 10/2011-12/2011.  Has been trying to get sober (no heroin, alcohol since 10/2011).    Social Determinants of Health   Financial Resource Strain:   .  Difficulty of Paying Living Expenses:   Food Insecurity: No Food Insecurity  . Worried About Charity fundraiser in the Last Year: Never true  . Ran Out of Food in the Last Year: Never true  Transportation Needs:   . Lack of Transportation (Medical):   Marland Kitchen Lack of Transportation (Non-Medical):   Physical Activity:   . Days of Exercise per Week:   . Minutes of Exercise per Session:   Stress:   . Feeling of Stress :   Social Connections:   . Frequency of Communication with Friends and Family:   . Frequency of Social Gatherings with Friends and Family:   . Attends Religious Services:   . Active Member of Clubs or Organizations:   . Attends Archivist Meetings:   Marland Kitchen Marital Status:   Intimate Partner Violence:   . Fear of Current or Ex-Partner:   . Emotionally Abused:   Marland Kitchen Physically Abused:   . Sexually Abused:     Physical Exam Vitals reviewed.  Constitutional:      Appearance: He is normal weight.  HENT:     Head: Normocephalic.     Nose: Nose normal.     Mouth/Throat:     Mouth: Mucous membranes are moist.   Eyes:     Pupils: Pupils are equal, round, and reactive to light.  Cardiovascular:     Rate and Rhythm: Normal rate and regular rhythm.     Pulses: Normal pulses.     Heart sounds: Normal heart sounds.  Pulmonary:     Effort: Pulmonary effort is normal.     Breath sounds: Normal breath sounds.  Abdominal:     General: Abdomen is flat.  Musculoskeletal:        General: Normal range of motion.     Cervical back: Normal range of motion.     Right lower leg: No edema.     Left lower leg: No edema.  Skin:    General: Skin is warm and dry.     Capillary Refill: Capillary refill takes less than 2 seconds.  Neurological:     Mental Status: He is alert. Mental status is at baseline.  Psychiatric:        Mood and Affect: Mood normal.      Arrived for home visit for Brad [name] who was alert and oriented seated on the couch of his apartment reporting he feels fine with no complaints. Brad [name] has been moderatley compliant with his medications over the last two weeks. He has yet to pick up new medications from [facility], I reminded him of same. Vitals were obtained. Medications were reviewed and confirmed. Pill box filled accordingly. Evaan agreed to visit in one week. Home visit complete.   Refills: NONE    Future Appointments  Date Time Provider [place]  08/06/2020 10:10 AM Charlott Rakes, MD [facility] None     ACTION: Home visit completed Next visit planned for one week

## 2020-07-09 NOTE — Telephone Encounter (Signed)
Message received from [employer] requesting a call back.  Call returned to her # [phone], message left with call back requested to this CM.

## 2020-07-09 NOTE — Telephone Encounter (Signed)
Call received from patient. He explained that he received his benefits letter from [facility] but is still waiting for the letter stating that he is a [place] citizen

## 2020-07-13 IMAGING — DX CHEST - 2 VIEW
2 series · 2 of 2 positions shown · non-contrast
Comparison: June 19, 2019

CLINICAL DATA: Chest pain

EXAM:
CHEST - 2 VIEW

[chest pa]
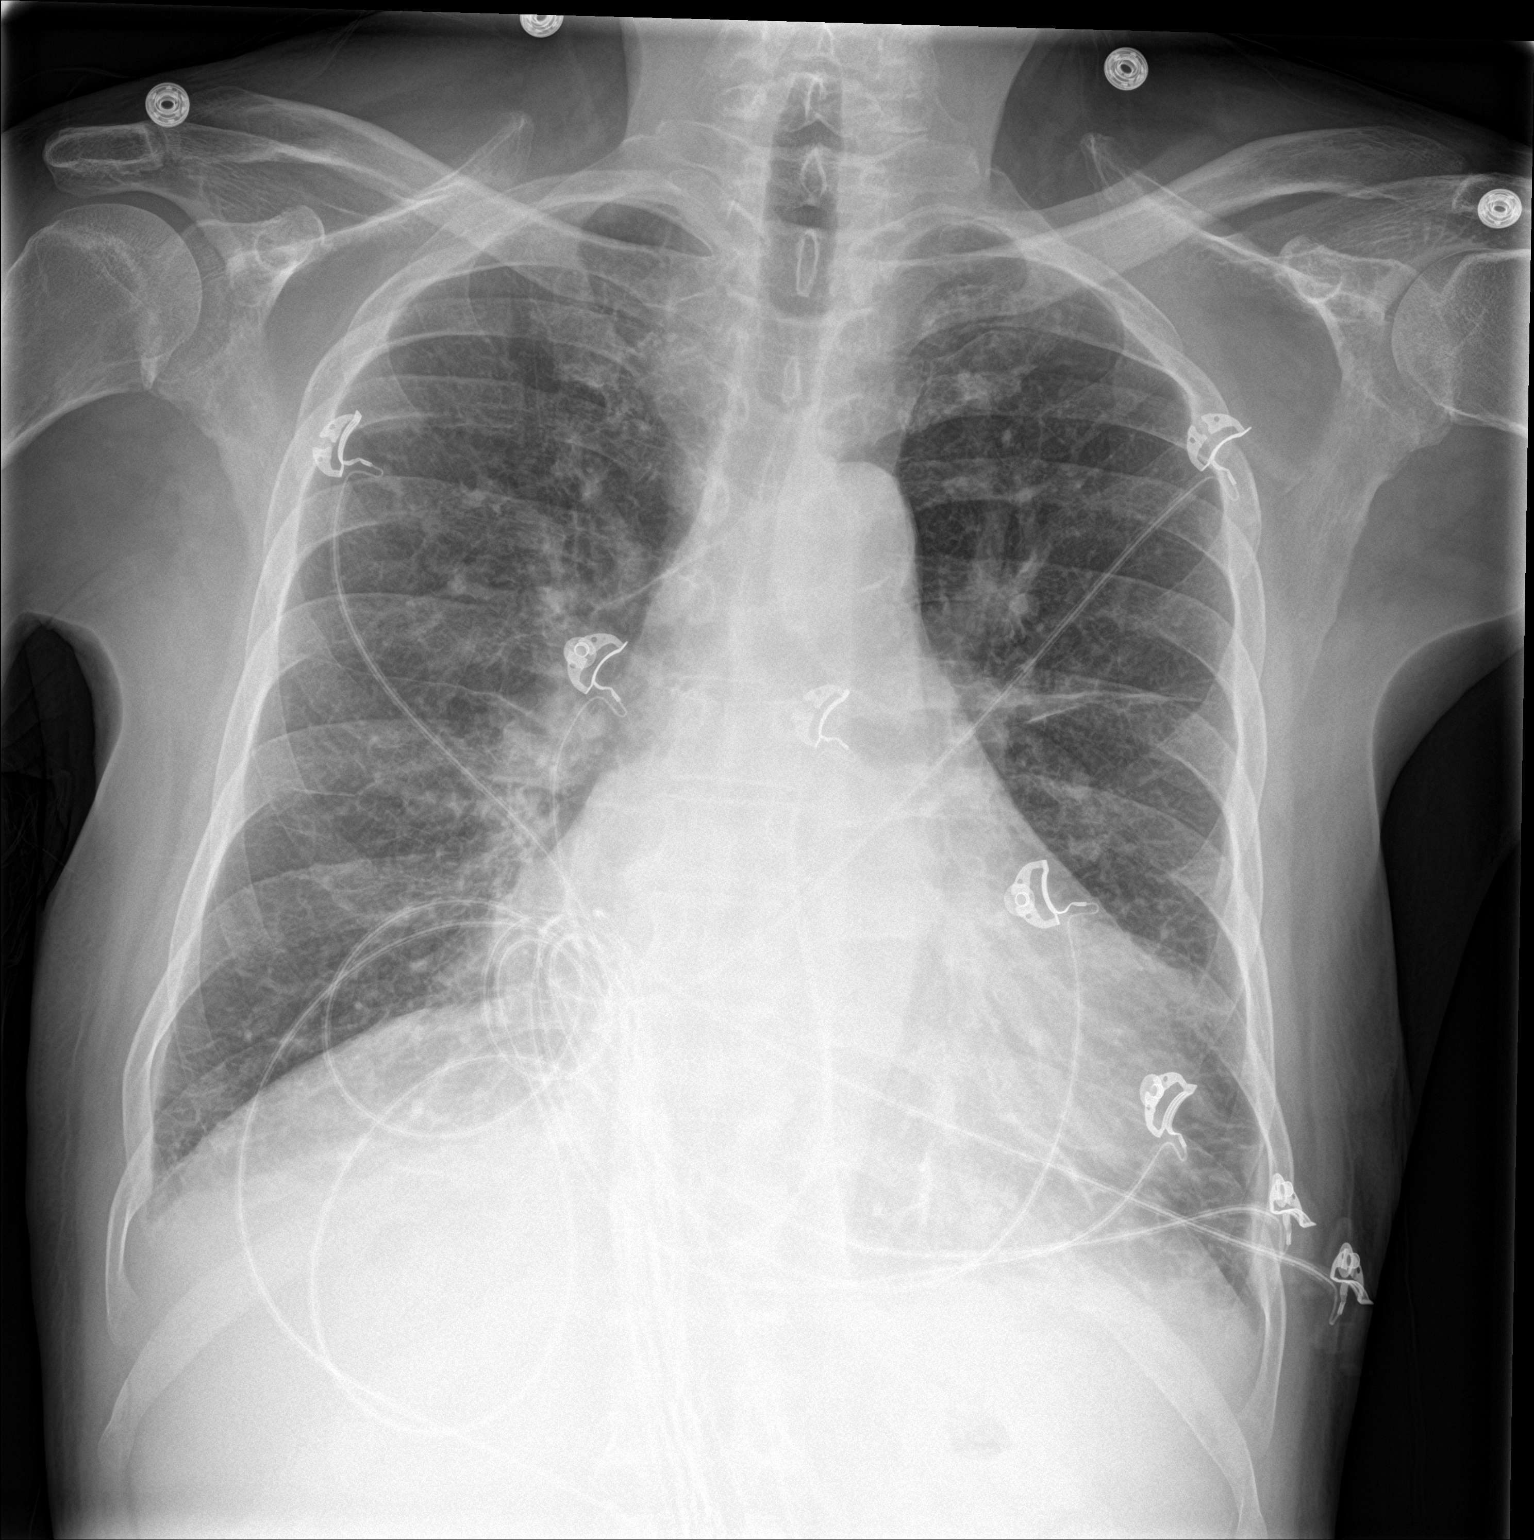

[chest lat]
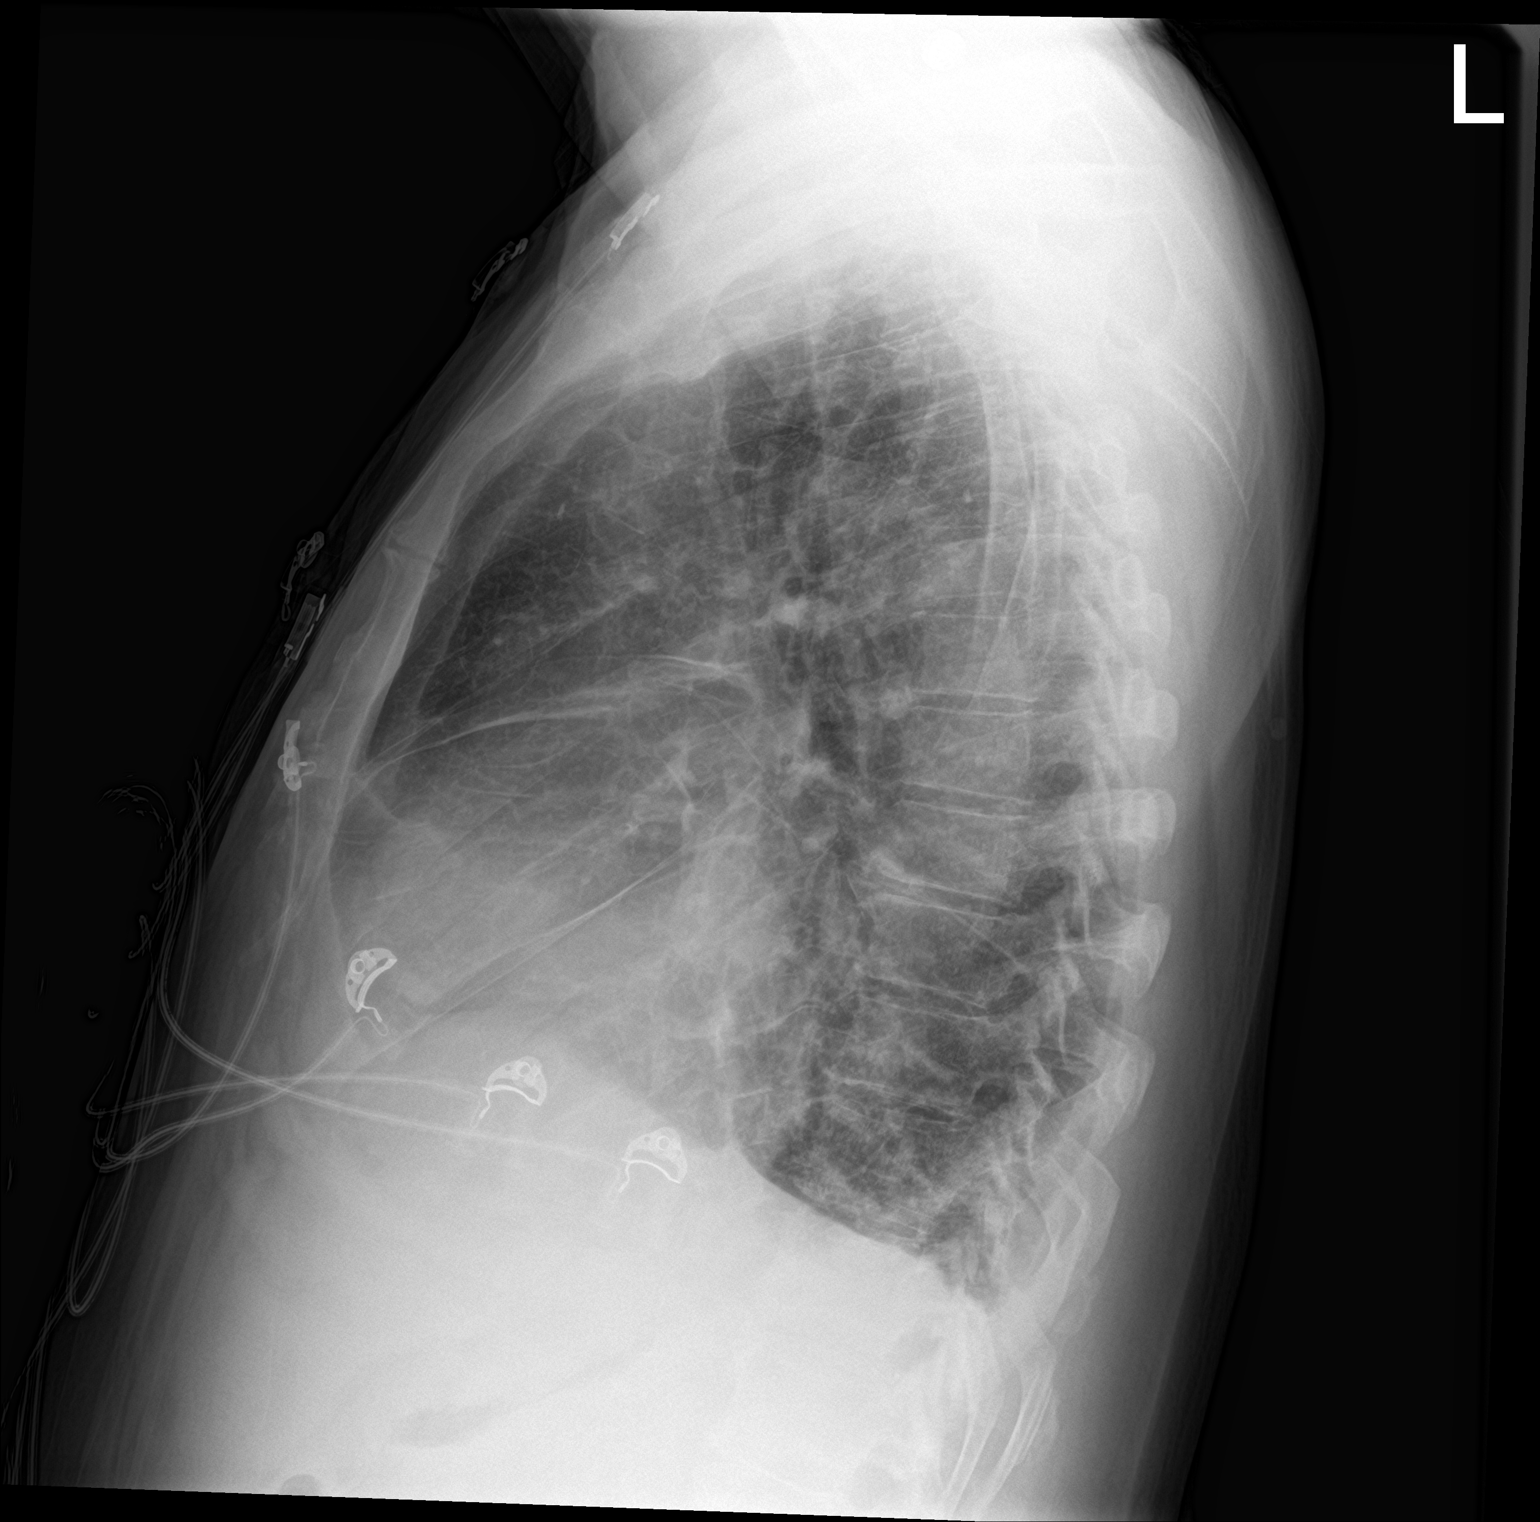

[2 of 2 positions shown; findings below may reference images not displayed]

FINDINGS: Stable cardiomegaly. No pneumothorax. Scarring or atelectasis in the
left perihilar region and left base. No other interval changes.
IMPRESSION: No active cardiopulmonary disease.

## 2020-07-16 ENCOUNTER — Other Ambulatory Visit (HOSPITAL_COMMUNITY): Payer: Self-pay

## 2020-07-16 NOTE — Progress Notes (Signed)
Paramedicine Encounter    Patient ID: Brad [name], male    DOB: Feb 18, 1961, 59 y.o.   MRN: [id number]   Patient Care Team: Charlott Rakes, MD as PCP - General (Family Medicine) Debara Pickett Nadean Corwin, MD as PCP - Cardiology (Cardiology) Charlott Rakes, MD (Family Medicine) Charlott Rakes, MD (Family Medicine)  Patient Active Problem List   Diagnosis Date Noted  . Acute exacerbation of CHF (congestive heart failure) ([place]) 05/29/2020  . Chronic combined systolic and diastolic CHF (congestive heart failure) ([place]) 04/19/2020  . Alcohol abuse with intoxication ([place]) 03/23/2020  . Hyponatremia 03/23/2020  . Fall 03/23/2020  . Normocytic anemia 03/23/2020  . Leukopenia 03/23/2020  . HTN (hypertension) 03/23/2020  . HLD (hyperlipidemia) 03/23/2020  . AKI (acute kidney injury) ([place]) 03/23/2020  . CHF (congestive heart failure) ([place]) 03/23/2020  . Alcohol abuse with intoxication ([place]) 03/05/2020  . Chronic systolic CHF (congestive heart failure) ([place]) 10/31/2019  . Acute systolic CHF (congestive heart failure) ([place]) 08/02/2019  . Diarrhea 07/29/2019  . Gastroenteritis 07/22/2019  . Hypomagnesemia 06/19/2019  . Chest pain 06/07/2019  . Elevated troponin 05/23/2019  . Tobacco abuse 05/23/2019  . Alcohol abuse 02/21/2019  . Frequent falls 01/17/2019  . Severe mitral regurgitation   . Fall 11/01/2018  . Hyponatremia 10/31/2018  . Chronic atrial fibrillation   . Chronic hyponatremia 08/16/2018  . NSVT (nonsustained ventricular tachycardia) ([place])   . Concussion with loss of consciousness   . Scalp laceration   . Trauma   . Permanent atrial fibrillation   . Hypertensive heart disease   . Shortness of breath   . Pyogenic inflammation of bone ([place])   . Cirrhosis ([place]) 03/23/2018  . Right ankle pain 03/23/2018  . Syncope 08/07/2017  . Acute on chronic combined systolic and diastolic CHF (congestive heart failure) ([place]) 07/09/2017  . Atrial flutter ([place]) 07/09/2017  . Pre-syncope  07/08/2017  . Neuropathy 05/08/2017  . Substance induced mood disorder ([place]) 10/06/2016  . CVA (cerebral vascular accident) ([place]) 09/18/2016  . Left sided numbness   . Homelessness 08/21/2016  . S/P ORIF (open reduction internal fixation) fracture 08/01/2016  . CAD (coronary artery disease), native coronary artery 07/30/2016  . Surgery, elective   . Insomnia 07/22/2016  . NSTEMI (non-ST elevated myocardial infarction) ([place])   . Anemia 07/05/2016  . Thrombocytopenia ([place]) 07/05/2016  . Cocaine abuse ([place]) 07/02/2016  . Chest pain on breathing 07/01/2016  . Essential hypertension 07/01/2016  . DM type 2 (diabetes mellitus, type 2) ([place]) 07/01/2016  . Hypokalemia 07/01/2016  . CKD (chronic kidney disease) stage 3, GFR 30-59 ml/min 07/01/2016  . Painful diabetic neuropathy ([place]) 07/01/2016  . Acute hyponatremia 05/27/2016  . Polysubstance abuse ([place]) 05/27/2016  . Chronic hepatitis C with cirrhosis ([place]) 05/27/2016  . Depression 04/21/2012  . GERD (gastroesophageal reflux disease) 02/16/2012  . History of drug abuse ([place])   . Heroin addiction ([place]) 01/29/2012    Current Outpatient Medications:  .  amiodarone (PACERONE) 200 MG tablet, Take 1 tablet (200 mg total) by mouth daily., Disp: 30 tablet, Rfl: 0 .  aspirin EC 81 MG EC tablet, Take 1 tablet (81 mg total) by mouth daily. Swallow whole. (Patient not taking: Reported on 06/25/2020), Disp: 90 tablet, Rfl: 0 .  atorvastatin (LIPITOR) 20 MG tablet, Take 1 tablet (20 mg total) by mouth daily at 6 PM., Disp: 30 tablet, Rfl: 1 .  blood glucose meter kit and supplies KIT, Dispense based on patient and insurance preference. Use up to four times daily as  directed. (FOR ICD-9 250.00, 250.01). (Patient not taking: Reported on 06/25/2020), Disp: 1 each, Rfl: 0 .  folic acid (FOLVITE) 1 MG tablet, Take 1 tablet (1 mg total) by mouth daily. (Patient not taking: Reported on 06/25/2020), Disp: 90 tablet, Rfl: 0 .  gabapentin (NEURONTIN) 300 MG capsule,  Take 2 capsules (600 mg total) by mouth at bedtime., Disp: 60 capsule, Rfl: 3 .  glipiZIDE (GLUCOTROL) 5 MG tablet, Take 0.5 tablets (2.5 mg total) by mouth daily before breakfast. (Patient not taking: Reported on 07/09/2020), Disp: 30 tablet, Rfl: 0 .  lidocaine (LIDODERM) 5 %, Place 1 patch onto the skin daily. Remove & Discard patch within 12 hours or as directed by MD (Patient not taking: Reported on 07/09/2020), Disp: 30 patch, Rfl: 0 .  methocarbamol (ROBAXIN) 500 MG tablet, Take 1 tablet (500 mg total) by mouth every 6 (six) hours as needed for muscle spasms., Disp: 30 tablet, Rfl: 0 .  Multiple Vitamin (MULTIVITAMIN WITH MINERALS) TABS tablet, Take 1 tablet by mouth daily. (Patient not taking: Reported on 06/25/2020), Disp: 90 tablet, Rfl: 0 .  sodium chloride 1 g tablet, Take 2 tablets (2 g total) by mouth daily. (Patient not taking: Reported on 07/09/2020), Disp: 60 tablet, Rfl: 0 .  spironolactone (ALDACTONE) 25 MG tablet, Take 1 tablet (25 mg total) by mouth daily., Disp: 30 tablet, Rfl: 0 .  thiamine 100 MG tablet, Take 1 tablet (100 mg total) by mouth daily. (Patient not taking: Reported on 07/09/2020), Disp: 90 tablet, Rfl: 0 .  torsemide (DEMADEX) 20 MG tablet, Take 20 mg by mouth daily., Disp: , Rfl:  .  traMADol (ULTRAM) 50 MG tablet, Take 1 tablet (50 mg total) by mouth every 8 (eight) hours as needed for moderate pain., Disp: 14 tablet, Rfl: 0 Allergies  Allergen Reactions  . Angiotensin Receptor Blockers Anaphylaxis and Other (See Comments)    (Angioedema also with Lisinopril, therefore ARB's are contraindicated)  . Lisinopril Anaphylaxis and Swelling    Throat swelling  . Pamelor [Nortriptyline Hcl] Anaphylaxis and Swelling    Throat swells     Social History   Socioeconomic History  . Marital status: Single    Spouse name: Not on file  . Number of children: 3  . Years of education: 2y college  . Highest education level: Not on file  Occupational History  . 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 Smoking status: Current Every Day Smoker    Packs/day: 0.50    Years: 28.00    Pack years: 14.00    Types: Cigarettes  . Smokeless tobacco: Never Used  Vaping Use  . Vaping Use: Never used  Substance and Sexual Activity  . Alcohol use: Yes    Alcohol/week: 25.0 standard drinks    Types: 25 Cans of beer per week    Comment: "depends on the day"; reports not drinking every day, "I stopped about 2-3 wekes ago" - but drank yesterday  . Drug use: Not Currently    Types: IV, Cocaine, Heroin    Comment: 08/17/2018 "no heroin since 2013; I use cocaine ~ once/month, last use 03/21/2019  . Sexual activity: Not Currently  Other Topics Concern  . Not on file  Social History Narrative   ** Merged History Encounter **       ** Merged History Encounter **       Incarcerated from 2006-2010, then 10/2011-12/2011.  Has been  trying to get sober (no heroin, alcohol since 10/2011).    Social Determinants of Health   Financial Resource Strain:   . Difficulty of Paying Living Expenses:   Food Insecurity: No Food Insecurity  . Worried About Charity fundraiser in the Last Year: Never true  . Ran Out of Food in the Last Year: Never true  Transportation Needs:   . Lack of Transportation (Medical):   Marland Kitchen Lack of Transportation (Non-Medical):   Physical Activity:   . Days of Exercise per Week:   . Minutes of Exercise per Session:   Stress:   . Feeling of Stress :   Social Connections:   . Frequency of Communication with Friends and Family:   . Frequency of Social Gatherings with Friends and Family:   . Attends Religious Services:   . Active Member of Clubs or Organizations:   . Attends Archivist Meetings:   Marland Kitchen Marital Status:   Intimate Partner Violence:   . Fear of Current or Ex-Partner:   . Emotionally Abused:   Marland Kitchen Physically Abused:   . Sexually Abused:     Physical Exam Vitals  reviewed.  Constitutional:      Appearance: He is normal weight.  HENT:     Head: Normocephalic.     Nose: Nose normal.     Mouth/Throat:     Mouth: Mucous membranes are moist.     Pharynx: Oropharynx is clear.  Eyes:     Pupils: Pupils are equal, round, and reactive to light.  Cardiovascular:     Rate and Rhythm: Normal rate and regular rhythm.     Pulses: Normal pulses.     Heart sounds: Normal heart sounds.  Pulmonary:     Effort: Pulmonary effort is normal.     Breath sounds: Normal breath sounds.  Abdominal:     General: Abdomen is flat.     Palpations: Abdomen is soft.  Musculoskeletal:        General: Tenderness present. Normal range of motion.     Cervical back: Normal range of motion.     Right lower leg: No edema.     Left lower leg: No edema.     Comments: Tingling in hands.   Skin:    General: Skin is warm and dry.     Capillary Refill: Capillary refill takes less than 2 seconds.  Neurological:     Mental Status: He is alert. Mental status is at baseline.  Psychiatric:        Mood and Affect: Mood normal.      Arrived for home visit for Duc who was alert and oriented seated outside on the side walk reporting he feels great aside from having some bilateral hand tenderness, tingling and numbness. Zamere was noted to be non compliant with his Gabapentin over the last week. I educated Demarrio on the importance of taking his medications. He verbalized understanding. Medications were verified and confirmed. Pill box filled. Vitals were obtained and as noted. No edema, lung sounds clear and no JVD. Yousif agreed to visit in one week. Home visit complete.   Refills: NONE       Future Appointments  Date Time Provider [place]  08/06/2020 10:10 AM Charlott Rakes, MD [facility] None     ACTION: Home visit completed Next visit planned for one week

## 2020-07-18 ENCOUNTER — Emergency Department (HOSPITAL_COMMUNITY): Payer: Medicaid Other

## 2020-07-18 ENCOUNTER — Encounter (HOSPITAL_COMMUNITY): Payer: Self-pay

## 2020-07-18 ENCOUNTER — Other Ambulatory Visit: Payer: Self-pay

## 2020-07-18 ENCOUNTER — Inpatient Hospital Stay (HOSPITAL_COMMUNITY): Payer: Medicaid Other

## 2020-07-18 ENCOUNTER — Inpatient Hospital Stay (HOSPITAL_COMMUNITY)
Admission: EM | Admit: 2020-07-18 | Discharge: 2020-07-18 | Disposition: A | Discharge status: Home / Self Care | Payer: Medicaid Other | Source: Home / Self Care | Attending: Emergency Medicine | Admitting: Emergency Medicine

## 2020-07-18 ENCOUNTER — Inpatient Hospital Stay (HOSPITAL_COMMUNITY)
Admission: EM | Admit: 2020-07-18 | Discharge: 2020-07-27 | DRG: 981 | Disposition: A | Discharge status: Home / Self Care | Payer: Medicaid Other | Attending: Family Medicine | Admitting: Family Medicine

## 2020-07-18 DIAGNOSIS — F1010 Alcohol abuse, uncomplicated: Secondary | ICD-10-CM | POA: Diagnosis present

## 2020-07-18 DIAGNOSIS — I5023 Acute on chronic systolic (congestive) heart failure: Secondary | ICD-10-CM | POA: Diagnosis present

## 2020-07-18 DIAGNOSIS — F17210 Nicotine dependence, cigarettes, uncomplicated: Secondary | ICD-10-CM | POA: Diagnosis present

## 2020-07-18 DIAGNOSIS — I10 Essential (primary) hypertension: Secondary | ICD-10-CM | POA: Diagnosis not present

## 2020-07-18 DIAGNOSIS — G9520 Unspecified cord compression: Secondary | ICD-10-CM | POA: Diagnosis not present

## 2020-07-18 DIAGNOSIS — W19XXXD Unspecified fall, subsequent encounter: Secondary | ICD-10-CM | POA: Diagnosis present

## 2020-07-18 DIAGNOSIS — I482 Chronic atrial fibrillation, unspecified: Secondary | ICD-10-CM | POA: Diagnosis present

## 2020-07-18 DIAGNOSIS — F329 Major depressive disorder, single episode, unspecified: Secondary | ICD-10-CM | POA: Diagnosis present

## 2020-07-18 DIAGNOSIS — Z7982 Long term (current) use of aspirin: Secondary | ICD-10-CM

## 2020-07-18 DIAGNOSIS — I5022 Chronic systolic (congestive) heart failure: Secondary | ICD-10-CM | POA: Diagnosis present

## 2020-07-18 DIAGNOSIS — Z96652 Presence of left artificial knee joint: Secondary | ICD-10-CM | POA: Diagnosis present

## 2020-07-18 DIAGNOSIS — M4712 Other spondylosis with myelopathy, cervical region: Secondary | ICD-10-CM | POA: Diagnosis present

## 2020-07-18 DIAGNOSIS — I428 Other cardiomyopathies: Secondary | ICD-10-CM | POA: Diagnosis present

## 2020-07-18 DIAGNOSIS — I130 Hypertensive heart and chronic kidney disease with heart failure and stage 1 through stage 4 chronic kidney disease, or unspecified chronic kidney disease: Secondary | ICD-10-CM | POA: Diagnosis present

## 2020-07-18 DIAGNOSIS — E871 Hypo-osmolality and hyponatremia: Principal | ICD-10-CM | POA: Diagnosis present

## 2020-07-18 DIAGNOSIS — E7800 Pure hypercholesterolemia, unspecified: Secondary | ICD-10-CM | POA: Diagnosis present

## 2020-07-18 DIAGNOSIS — Z9119 Patient's noncompliance with other medical treatment and regimen: Secondary | ICD-10-CM

## 2020-07-18 DIAGNOSIS — K219 Gastro-esophageal reflux disease without esophagitis: Secondary | ICD-10-CM | POA: Diagnosis present

## 2020-07-18 DIAGNOSIS — Z20822 Contact with and (suspected) exposure to covid-19: Secondary | ICD-10-CM | POA: Diagnosis present

## 2020-07-18 DIAGNOSIS — N179 Acute kidney failure, unspecified: Secondary | ICD-10-CM | POA: Diagnosis present

## 2020-07-18 DIAGNOSIS — I4821 Permanent atrial fibrillation: Secondary | ICD-10-CM | POA: Diagnosis present

## 2020-07-18 DIAGNOSIS — K7460 Unspecified cirrhosis of liver: Secondary | ICD-10-CM | POA: Diagnosis present

## 2020-07-18 DIAGNOSIS — E785 Hyperlipidemia, unspecified: Secondary | ICD-10-CM | POA: Diagnosis present

## 2020-07-18 DIAGNOSIS — D638 Anemia in other chronic diseases classified elsewhere: Secondary | ICD-10-CM | POA: Diagnosis present

## 2020-07-18 DIAGNOSIS — E1122 Type 2 diabetes mellitus with diabetic chronic kidney disease: Secondary | ICD-10-CM | POA: Diagnosis present

## 2020-07-18 DIAGNOSIS — Z419 Encounter for procedure for purposes other than remedying health state, unspecified: Secondary | ICD-10-CM

## 2020-07-18 DIAGNOSIS — E8809 Other disorders of plasma-protein metabolism, not elsewhere classified: Secondary | ICD-10-CM | POA: Diagnosis present

## 2020-07-18 DIAGNOSIS — F1410 Cocaine abuse, uncomplicated: Secondary | ICD-10-CM | POA: Diagnosis present

## 2020-07-18 DIAGNOSIS — M2578 Osteophyte, vertebrae: Secondary | ICD-10-CM | POA: Diagnosis present

## 2020-07-18 DIAGNOSIS — R001 Bradycardia, unspecified: Secondary | ICD-10-CM | POA: Diagnosis present

## 2020-07-18 DIAGNOSIS — Z79899 Other long term (current) drug therapy: Secondary | ICD-10-CM

## 2020-07-18 DIAGNOSIS — E119 Type 2 diabetes mellitus without complications: Secondary | ICD-10-CM

## 2020-07-18 DIAGNOSIS — R52 Pain, unspecified: Secondary | ICD-10-CM

## 2020-07-18 DIAGNOSIS — F1911 Other psychoactive substance abuse, in remission: Secondary | ICD-10-CM | POA: Diagnosis present

## 2020-07-18 DIAGNOSIS — R569 Unspecified convulsions: Secondary | ICD-10-CM | POA: Diagnosis not present

## 2020-07-18 DIAGNOSIS — R296 Repeated falls: Secondary | ICD-10-CM | POA: Diagnosis present

## 2020-07-18 DIAGNOSIS — M4802 Spinal stenosis, cervical region: Secondary | ICD-10-CM | POA: Diagnosis not present

## 2020-07-18 DIAGNOSIS — Z888 Allergy status to other drugs, medicaments and biological substances status: Secondary | ICD-10-CM

## 2020-07-18 DIAGNOSIS — Z8673 Personal history of transient ischemic attack (TIA), and cerebral infarction without residual deficits: Secondary | ICD-10-CM

## 2020-07-18 DIAGNOSIS — I493 Ventricular premature depolarization: Secondary | ICD-10-CM | POA: Diagnosis present

## 2020-07-18 DIAGNOSIS — I350 Nonrheumatic aortic (valve) stenosis: Secondary | ICD-10-CM | POA: Diagnosis present

## 2020-07-18 DIAGNOSIS — N1831 Chronic kidney disease, stage 3a: Secondary | ICD-10-CM | POA: Diagnosis present

## 2020-07-18 DIAGNOSIS — Z8546 Personal history of malignant neoplasm of prostate: Secondary | ICD-10-CM

## 2020-07-18 DIAGNOSIS — G4700 Insomnia, unspecified: Secondary | ICD-10-CM | POA: Diagnosis present

## 2020-07-18 DIAGNOSIS — B182 Chronic viral hepatitis C: Secondary | ICD-10-CM | POA: Diagnosis present

## 2020-07-18 DIAGNOSIS — S2241XD Multiple fractures of ribs, right side, subsequent encounter for fracture with routine healing: Secondary | ICD-10-CM | POA: Diagnosis not present

## 2020-07-18 DIAGNOSIS — I2510 Atherosclerotic heart disease of native coronary artery without angina pectoris: Secondary | ICD-10-CM | POA: Diagnosis present

## 2020-07-18 DIAGNOSIS — G253 Myoclonus: Secondary | ICD-10-CM | POA: Diagnosis present

## 2020-07-18 DIAGNOSIS — Z8249 Family history of ischemic heart disease and other diseases of the circulatory system: Secondary | ICD-10-CM

## 2020-07-18 DIAGNOSIS — I5043 Acute on chronic combined systolic (congestive) and diastolic (congestive) heart failure: Secondary | ICD-10-CM | POA: Diagnosis not present

## 2020-07-18 LAB — CBC
HCT: 33.3 % — ABNORMAL LOW (ref 39.0–52.0)
HCT: 33.9 % — ABNORMAL LOW (ref 39.0–52.0)
Hemoglobin: 11.6 g/dL — ABNORMAL LOW (ref 13.0–17.0)
Hemoglobin: 11.8 g/dL — ABNORMAL LOW (ref 13.0–17.0)
MCH: 29.6 pg (ref 26.0–34.0)
MCH: 29.8 pg (ref 26.0–34.0)
MCHC: 34.8 g/dL (ref 30.0–36.0)
MCHC: 34.8 g/dL (ref 30.0–36.0)
MCV: 84.9 fL (ref 80.0–100.0)
MCV: 85.6 fL (ref 80.0–100.0)
Platelets: 239 10*3/uL (ref 150–400)
Platelets: 221 10*3/uL (ref 150–400)
RBC: 3.92 MIL/uL — ABNORMAL LOW (ref 4.22–5.81)
RBC: 3.96 MIL/uL — ABNORMAL LOW (ref 4.22–5.81)
RDW: 14 % (ref 11.5–15.5)
RDW: 13.8 % (ref 11.5–15.5)
WBC: 3.2 10*3/uL — ABNORMAL LOW (ref 4.0–10.5)
WBC: 3.7 10*3/uL — ABNORMAL LOW (ref 4.0–10.5)
nRBC: 0 % (ref 0.0–0.2)
nRBC: 0 % (ref 0.0–0.2)

## 2020-07-18 LAB — COMPREHENSIVE METABOLIC PANEL
ALT: 20 U/L (ref 0–44)
AST: 27 U/L (ref 15–41)
Albumin: 2.7 g/dL — ABNORMAL LOW (ref 3.5–5.0)
Alkaline Phosphatase: 50 U/L (ref 38–126)
Anion gap: 12 (ref 5–15)
BUN: 16 mg/dL (ref 6–20)
CO2: 21 mmol/L — ABNORMAL LOW (ref 22–32)
Calcium: 8.1 mg/dL — ABNORMAL LOW (ref 8.9–10.3)
Chloride: 88 mmol/L — ABNORMAL LOW (ref 98–111)
Creatinine, Ser: 0.98 mg/dL (ref 0.61–1.24)
GFR calc Af Amer: >60 mL/min (ref >60)
GFR calc non Af Amer: >60 mL/min (ref >60)
Glucose, Bld: 142 mg/dL — ABNORMAL HIGH (ref 70–99)
Potassium: 4.4 mmol/L (ref 3.5–5.1)
Sodium: 121 mmol/L — ABNORMAL LOW (ref 135–145)
Total Bilirubin: 0.8 mg/dL (ref 0.3–1.2)
Total Protein: 6 g/dL — ABNORMAL LOW (ref 6.5–8.1)

## 2020-07-18 LAB — NA AND K (SODIUM & POTASSIUM), RAND UR
Potassium Urine: 16 mmol/L
Sodium, Ur: 11 mmol/L

## 2020-07-18 LAB — BASIC METABOLIC PANEL
Anion gap: 13 (ref 5–15)
BUN: 15 mg/dL (ref 6–20)
CO2: 21 mmol/L — ABNORMAL LOW (ref 22–32)
Calcium: 8.1 mg/dL — ABNORMAL LOW (ref 8.9–10.3)
Chloride: 82 mmol/L — ABNORMAL LOW (ref 98–111)
Creatinine, Ser: 1 mg/dL (ref 0.61–1.24)
GFR calc Af Amer: >60 mL/min (ref >60)
GFR calc non Af Amer: >60 mL/min (ref >60)
Glucose, Bld: 146 mg/dL — ABNORMAL HIGH (ref 70–99)
Potassium: 4.3 mmol/L (ref 3.5–5.1)
Sodium: 116 mmol/L — CL (ref 135–145)

## 2020-07-18 LAB — SARS CORONAVIRUS 2 BY RT PCR (HOSPITAL ORDER, PERFORMED IN ~~LOC~~ HOSPITAL LAB): SARS Coronavirus 2: NEGATIVE

## 2020-07-18 LAB — VITAMIN B12: Vitamin B-12: 160 pg/mL — ABNORMAL LOW (ref 180–914)

## 2020-07-18 LAB — TSH: TSH: 4.249 u[IU]/mL (ref 0.350–4.500)

## 2020-07-18 LAB — FOLATE: Folate: 17.1 ng/mL

## 2020-07-18 LAB — OSMOLALITY: Osmolality: 257 mOsm/kg — ABNORMAL LOW (ref 275–295)

## 2020-07-18 LAB — CBG MONITORING, ED: Glucose-Capillary: 134 mg/dL — ABNORMAL HIGH (ref 70–99)

## 2020-07-18 LAB — AMMONIA: Ammonia: 13 umol/L (ref 9–35)

## 2020-07-18 LAB — OSMOLALITY, URINE: Osmolality, Ur: 122 mOsm/kg — ABNORMAL LOW (ref 300–900)

## 2020-07-18 IMAGING — DX CHEST - 2 VIEW
2 series · 2 of 2 positions shown · non-contrast
Comparison: [DATE]

CLINICAL DATA: Pt complains of central chest pain and BLE swelling
x several days; reports hx of CHF,DM, HTN, and atrial flutter;
smoker

EXAM:
CHEST - 2 VIEW

[w chest lat]
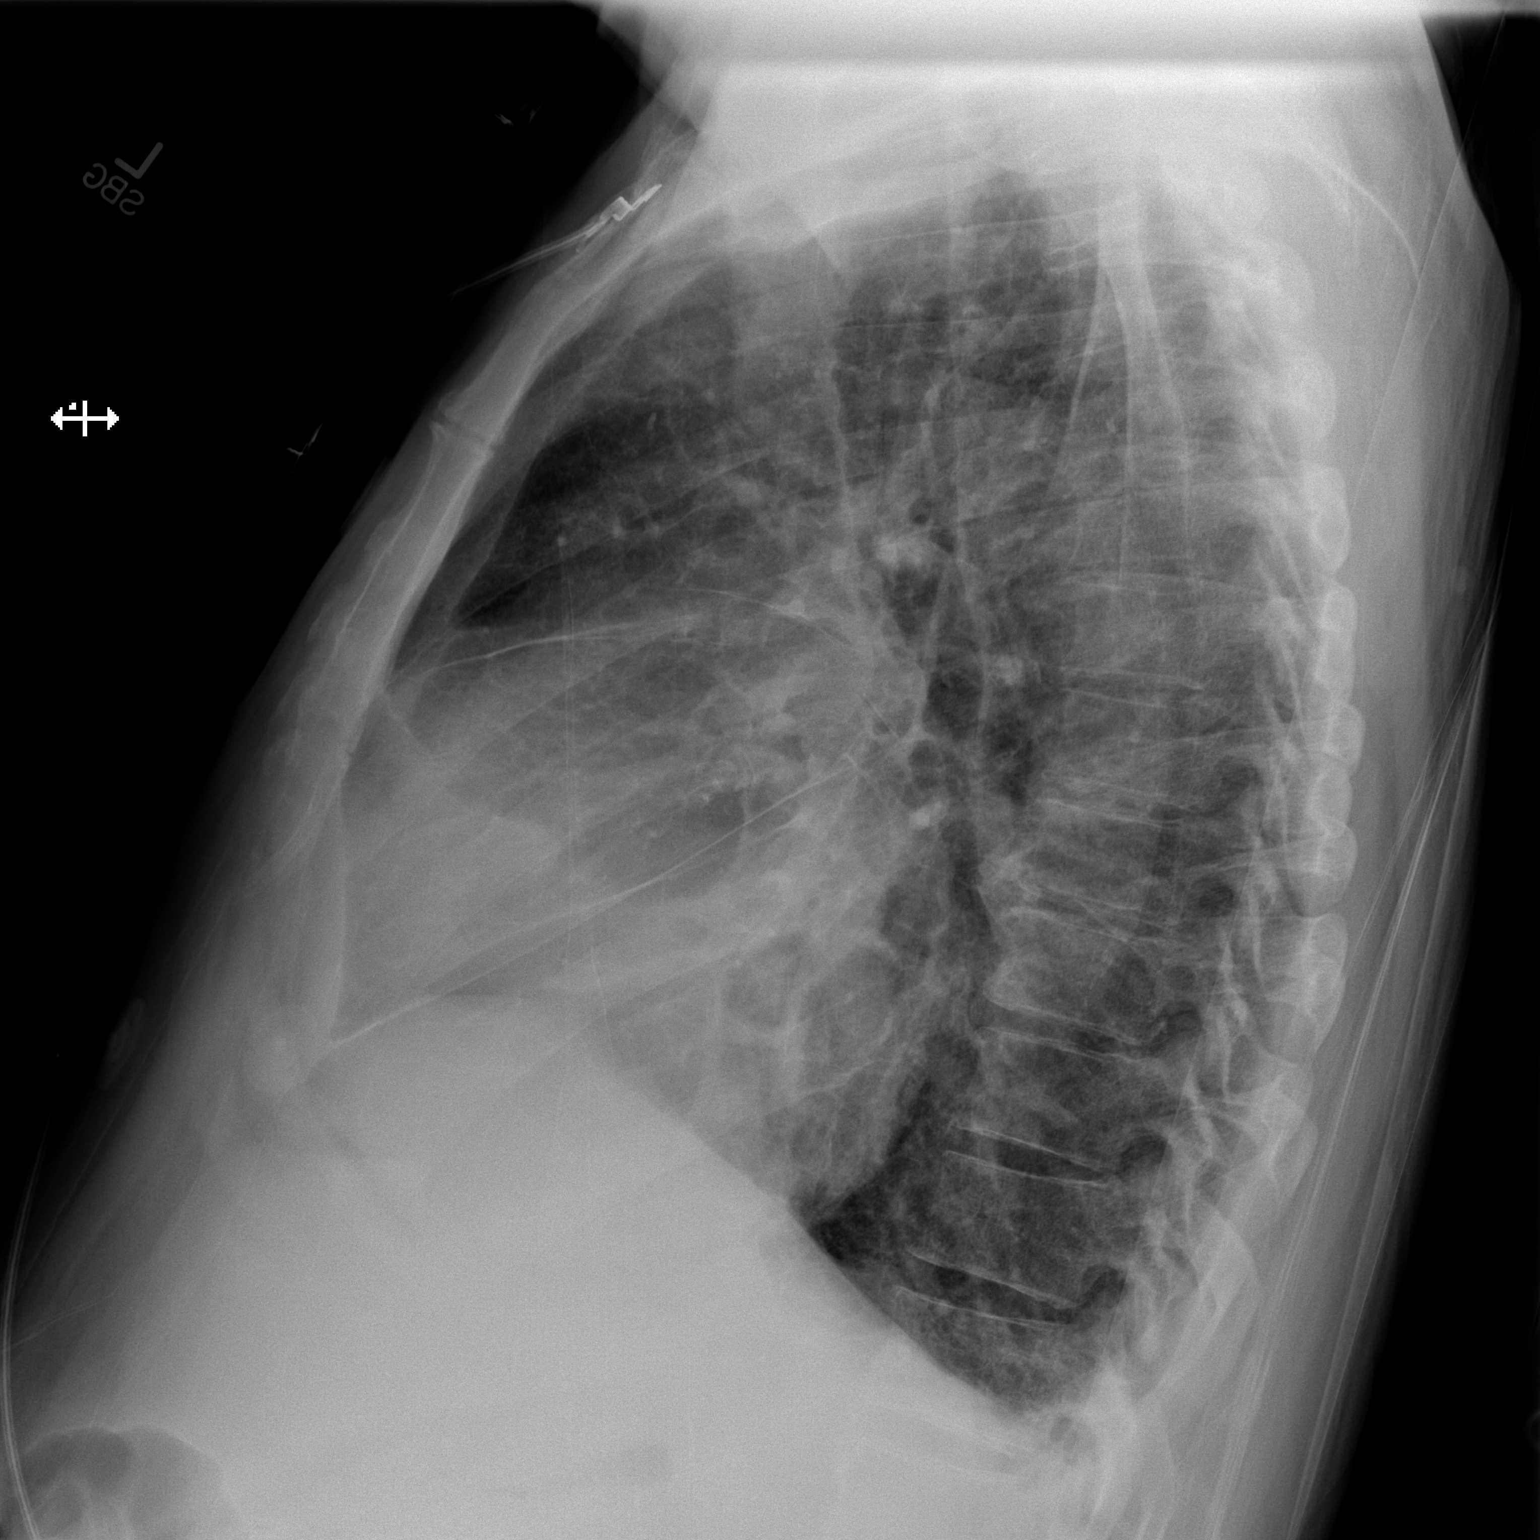

[w chest pa]
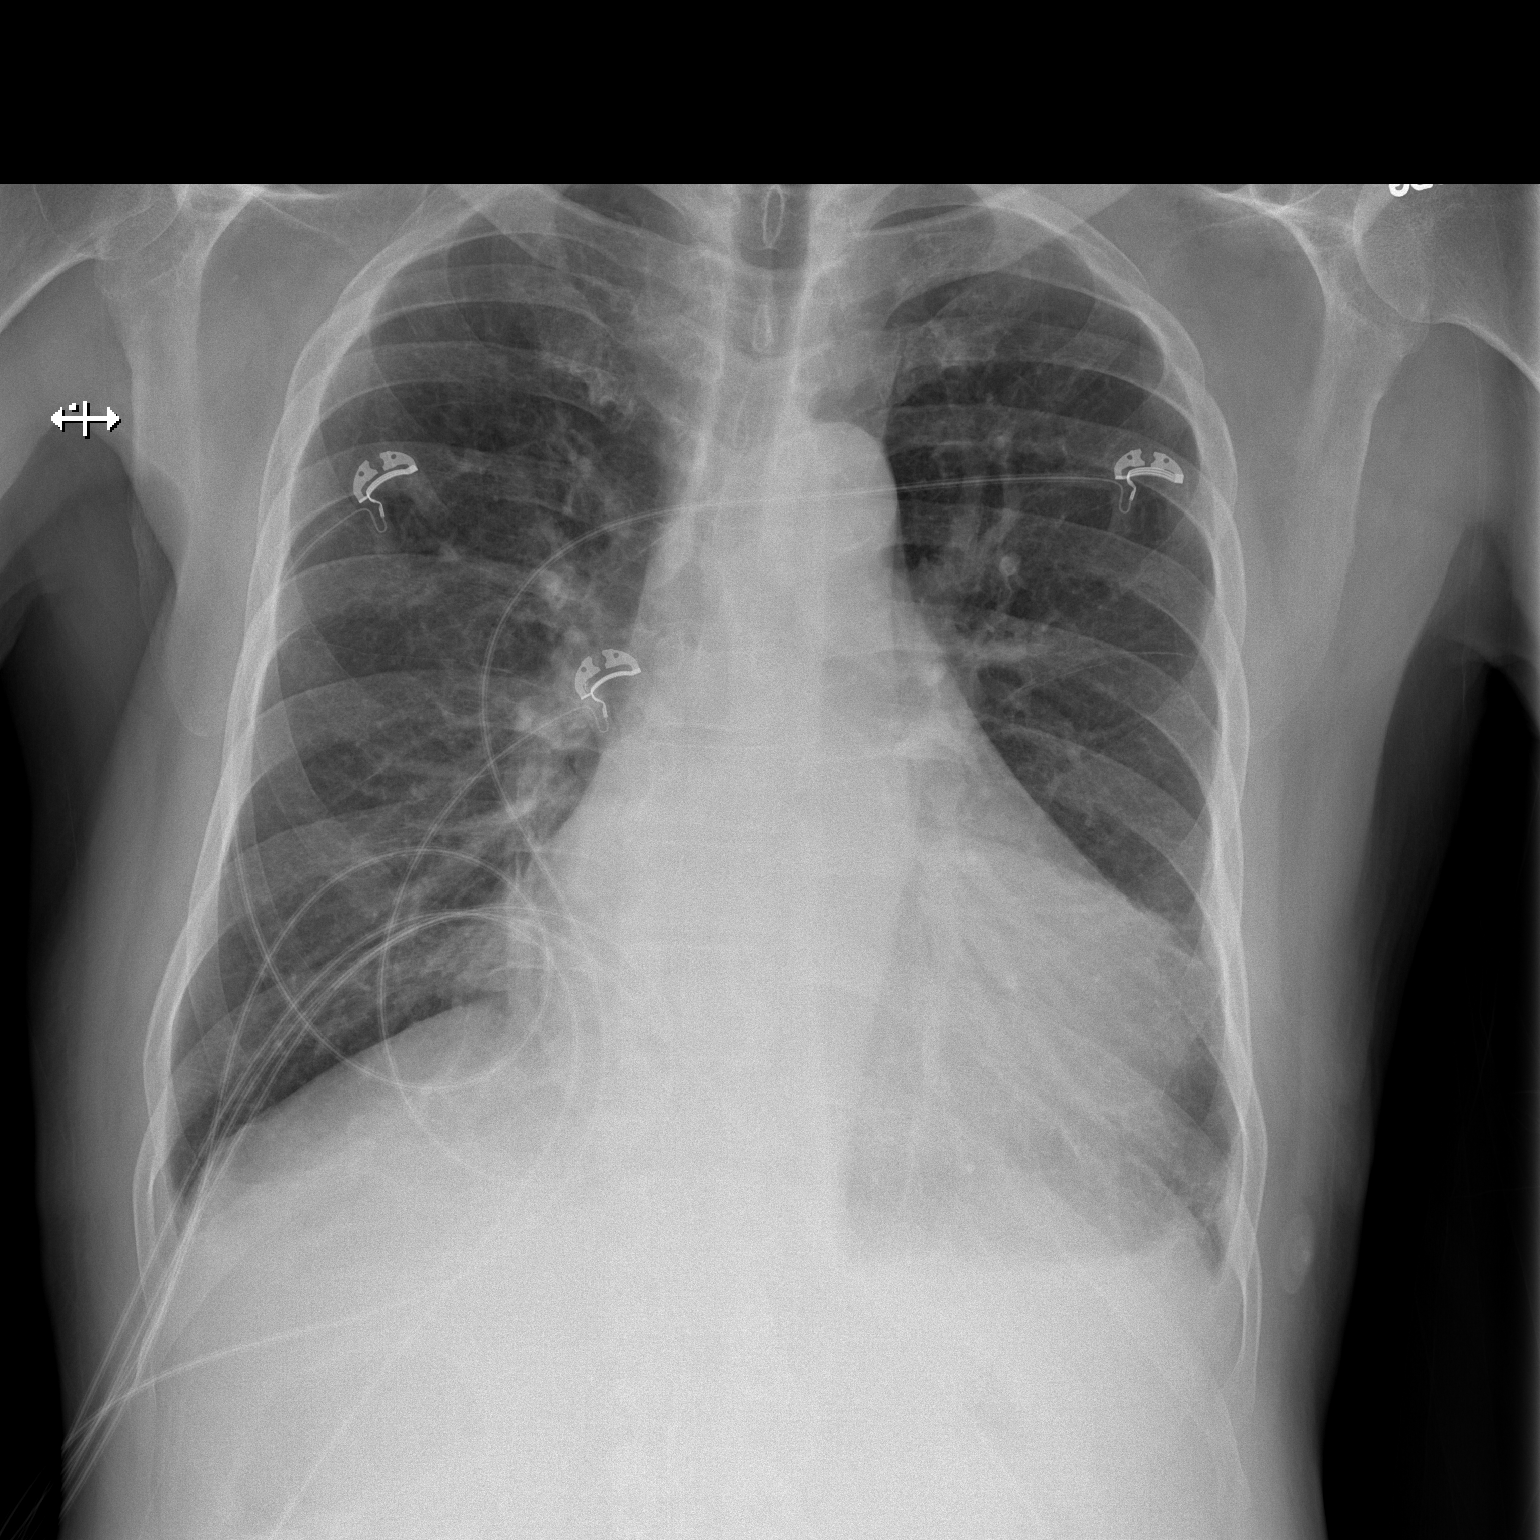

[2 of 2 positions shown; findings below may reference images not displayed]

FINDINGS: The heart is enlarged. No focal consolidations. Small LEFT pleural
effusion. No pulmonary edema.
IMPRESSION: 1. Cardiomegaly.
2. Small LEFT pleural effusion.

## 2020-07-18 MED ORDER — PROMETHAZINE HCL 25 MG/ML IJ SOLN: 6.2500 mg | Freq: Four times a day (QID) | INTRAMUSCULAR | Status: DC | PRN

## 2020-07-18 MED ORDER — ONDANSETRON HCL 4 MG/2ML IJ SOLN
4.0000 mg | Freq: Once | INTRAMUSCULAR | Status: AC
  Administered 2020-07-18: 4 mg via INTRAVENOUS
  Filled 2020-07-18: qty 2

## 2020-07-18 MED ORDER — THIAMINE HCL 100 MG PO TABS
100.0000 mg | ORAL_TABLET | Freq: Every day | ORAL | Status: DC
  Administered 2020-07-18 – 2020-07-27 (×9): 100 mg via ORAL
  Filled 2020-07-18 (×10): qty 1

## 2020-07-18 MED ORDER — LIDOCAINE 5 % EX PTCH
1.0000 | MEDICATED_PATCH | CUTANEOUS | Status: DC
  Administered 2020-07-18 – 2020-07-26 (×5): 1 via TRANSDERMAL
  Filled 2020-07-18 (×8): qty 1

## 2020-07-18 MED ORDER — TRAMADOL HCL 50 MG PO TABS
50.0000 mg | ORAL_TABLET | Freq: Three times a day (TID) | ORAL | Status: DC | PRN
  Administered 2020-07-19 – 2020-07-27 (×8): 50 mg via ORAL
  Filled 2020-07-18 (×8): qty 1

## 2020-07-18 MED ORDER — ONDANSETRON HCL 4 MG/2ML IJ SOLN
4.0000 mg | Freq: Four times a day (QID) | INTRAMUSCULAR | Status: DC | PRN
  Administered 2020-07-19 – 2020-07-21 (×4): 4 mg via INTRAVENOUS
  Filled 2020-07-18 (×4): qty 2

## 2020-07-18 MED ORDER — GLIPIZIDE 2.5 MG HALF TABLET: 2.5000 mg | ORAL_TABLET | Freq: Every day | ORAL | Status: DC

## 2020-07-18 MED ORDER — AMIODARONE HCL 200 MG PO TABS
200.0000 mg | ORAL_TABLET | Freq: Every day | ORAL | Status: DC
  Administered 2020-07-18 – 2020-07-27 (×10): 200 mg via ORAL
  Filled 2020-07-18 (×10): qty 1

## 2020-07-18 MED ORDER — SODIUM CHLORIDE 1 G PO TABS
1.0000 g | ORAL_TABLET | Freq: Once | ORAL | Status: AC
  Administered 2020-07-18: 1 g via ORAL
  Filled 2020-07-18: qty 1

## 2020-07-18 MED ORDER — FOLIC ACID 1 MG PO TABS
1.0000 mg | ORAL_TABLET | Freq: Every day | ORAL | Status: DC
  Administered 2020-07-18 – 2020-07-27 (×10): 1 mg via ORAL
  Filled 2020-07-18 (×10): qty 1

## 2020-07-18 MED ORDER — SODIUM CHLORIDE 1 G PO TABS
1.0000 g | ORAL_TABLET | Freq: Two times a day (BID) | ORAL | Status: DC
  Administered 2020-07-18 – 2020-07-19 (×2): 1 g via ORAL
  Filled 2020-07-18 (×4): qty 1

## 2020-07-18 MED ORDER — ACETAMINOPHEN 650 MG RE SUPP: 650.0000 mg | Freq: Four times a day (QID) | RECTAL | Status: DC | PRN

## 2020-07-18 MED ORDER — MORPHINE SULFATE (PF) 4 MG/ML IV SOLN
4.0000 mg | Freq: Once | INTRAVENOUS | Status: AC
  Administered 2020-07-18: 4 mg via INTRAVENOUS
  Filled 2020-07-18: qty 1

## 2020-07-18 MED ORDER — ADULT MULTIVITAMIN W/MINERALS CH
1.0000 | ORAL_TABLET | Freq: Every day | ORAL | Status: DC
  Administered 2020-07-18 – 2020-07-27 (×10): 1 via ORAL
  Filled 2020-07-18 (×10): qty 1

## 2020-07-18 MED ORDER — METHOCARBAMOL 500 MG PO TABS
500.0000 mg | ORAL_TABLET | Freq: Four times a day (QID) | ORAL | Status: DC | PRN
  Administered 2020-07-18 – 2020-07-23 (×5): 500 mg via ORAL
  Filled 2020-07-18 (×5): qty 1

## 2020-07-18 MED ORDER — ASPIRIN 81 MG PO TBEC: 81.0000 mg | DELAYED_RELEASE_TABLET | Freq: Every day | ORAL | Status: DC

## 2020-07-18 MED ORDER — ATORVASTATIN CALCIUM 10 MG PO TABS
20.0000 mg | ORAL_TABLET | Freq: Every day | ORAL | Status: DC
  Administered 2020-07-18 – 2020-07-26 (×9): 20 mg via ORAL
  Filled 2020-07-18 (×9): qty 2

## 2020-07-18 MED ORDER — SENNOSIDES-DOCUSATE SODIUM 8.6-50 MG PO TABS
1.0000 | ORAL_TABLET | Freq: Every evening | ORAL | Status: DC | PRN
  Administered 2020-07-23: 1 via ORAL
  Filled 2020-07-18: qty 1

## 2020-07-18 MED ORDER — LORAZEPAM 2 MG/ML IJ SOLN
1.0000 mg | Freq: Once | INTRAMUSCULAR | Status: AC
  Administered 2020-07-18: 1 mg via INTRAVENOUS
  Filled 2020-07-18: qty 1

## 2020-07-18 MED ORDER — LORAZEPAM 2 MG/ML IJ SOLN: 1.0000 mg | INTRAMUSCULAR | Status: AC | PRN

## 2020-07-18 MED ORDER — HYDROCODONE-ACETAMINOPHEN 5-325 MG PO TABS
1.0000 | ORAL_TABLET | ORAL | Status: DC | PRN
  Administered 2020-07-18 – 2020-07-22 (×14): 1 via ORAL
  Filled 2020-07-18 (×14): qty 1

## 2020-07-18 MED ORDER — ASPIRIN EC 81 MG PO TBEC
81.0000 mg | DELAYED_RELEASE_TABLET | Freq: Every day | ORAL | Status: DC
  Administered 2020-07-18 – 2020-07-19 (×2): 81 mg via ORAL
  Filled 2020-07-18 (×2): qty 1

## 2020-07-18 MED ORDER — INSULIN ASPART 100 UNIT/ML ~~LOC~~ SOLN
0.0000 [IU] | Freq: Three times a day (TID) | SUBCUTANEOUS | Status: DC
  Administered 2020-07-18: 1 [IU] via SUBCUTANEOUS
  Administered 2020-07-19: 2 [IU] via SUBCUTANEOUS
  Administered 2020-07-19: 3 [IU] via SUBCUTANEOUS
  Administered 2020-07-20: 1 [IU] via SUBCUTANEOUS
  Administered 2020-07-20: 5 [IU] via SUBCUTANEOUS
  Administered 2020-07-20: 1 [IU] via SUBCUTANEOUS
  Administered 2020-07-21: 2 [IU] via SUBCUTANEOUS
  Administered 2020-07-21: 1 [IU] via SUBCUTANEOUS
  Administered 2020-07-21: 2 [IU] via SUBCUTANEOUS
  Administered 2020-07-23: 3 [IU] via SUBCUTANEOUS
  Administered 2020-07-23: 1 [IU] via SUBCUTANEOUS
  Administered 2020-07-23: 2 [IU] via SUBCUTANEOUS
  Administered 2020-07-24: 1 [IU] via SUBCUTANEOUS
  Administered 2020-07-25 – 2020-07-27 (×6): 2 [IU] via SUBCUTANEOUS
  Filled 2020-07-18: qty 0.09

## 2020-07-18 MED ORDER — SODIUM CHLORIDE 0.9 % IV SOLN: Freq: Once | INTRAVENOUS | Status: AC

## 2020-07-18 MED ORDER — ONDANSETRON HCL 4 MG PO TABS: 4.0000 mg | ORAL_TABLET | Freq: Four times a day (QID) | ORAL | Status: DC | PRN

## 2020-07-18 MED ORDER — ACETAMINOPHEN 325 MG PO TABS: 650.0000 mg | ORAL_TABLET | Freq: Four times a day (QID) | ORAL | Status: DC | PRN

## 2020-07-18 MED ORDER — THIAMINE HCL 100 MG/ML IJ SOLN
100.0000 mg | Freq: Every day | INTRAMUSCULAR | Status: DC
  Administered 2020-07-21: 100 mg via INTRAVENOUS
  Filled 2020-07-18 (×3): qty 2

## 2020-07-18 MED ORDER — LORAZEPAM 1 MG PO TABS: 1.0000 mg | ORAL_TABLET | ORAL | Status: AC | PRN

## 2020-07-18 MED ORDER — ENOXAPARIN SODIUM 40 MG/0.4ML ~~LOC~~ SOLN
40.0000 mg | SUBCUTANEOUS | Status: DC
  Administered 2020-07-18: 40 mg via SUBCUTANEOUS
  Filled 2020-07-18: qty 0.4

## 2020-07-18 MED ORDER — GABAPENTIN 300 MG PO CAPS
600.0000 mg | ORAL_CAPSULE | Freq: Every day | ORAL | Status: DC
  Administered 2020-07-18 – 2020-07-24 (×7): 600 mg via ORAL
  Filled 2020-07-18 (×7): qty 2

## 2020-07-18 NOTE — ED Notes (Signed)
Date and time results received: 07/18/20 10:41 AM   Test: Sodium Critical Value: 116  Name of Provider Notified: Alvino Chapel  Orders Received? Or Actions Taken?: Notified
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
## 2020-07-18 NOTE — Progress Notes (Signed)
EEG complete - results pending 
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
## 2020-07-18 NOTE — Procedures (Signed)
Patient Name: Brad [name]  MRN: [id number]  Epilepsy Attending: Lora Havens  Referring Physician/Provider: Dr. Davonna Belling Date: 07/18/2020 Duration: 24.59 mins  Patient history: 58 year old male who presented to the ED with complaints of right-sided rib pain.  In the ED was noted to have sodium of 116.  While in the ED was also noted to have rhythmic right-sided jerking occurring approximately every 15 seconds.  EEG to evaluate for seizures.  Level of alertness: Awake, asleep  AEDs during EEG study: None  Technical aspects: This EEG study was done with scalp electrodes positioned according to the 10-20 International system of electrode placement. Electrical activity was acquired at a sampling rate of 500Hz  and reviewed with a high frequency filter of 70Hz  and a low frequency filter of 1Hz . EEG data were recorded continuously and digitally stored.   Description: The posterior dominant rhythm consists of 8.5 Hz activity of moderate voltage (25-35 uV) seen predominantly in posterior head regions, symmetric and reactive to eye opening and eye closing. Sleep was characterized by vertex waves, sleep spindles (12 to 14 Hz), maximal frontocentral region. Hyperventilation and photic stimulation were not performed.     Multiple episodes of intermittent brief right leg contraction/twitching were recorded during the EEG.  Concomitant EEG before, during and after the episode did not show any EEG change to suggest seizure.  IMPRESSION: This study is within normal limits. No seizures or epileptiform discharges were seen throughout the recording.  Multiple episodes of brief right lower extremity twitching were recorded without concomitant EEG change and were most likely nonepileptic.    [name] [name]
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
## 2020-07-18 NOTE — ED Triage Notes (Addendum)
Patient arrived via [facility] from home.   C/O right side rib pain from a fall 4 weeks ago.   Pain started last night from laying on it. No bruising or deformities noted by ems.   Lungs clear per ems.   10/10 pain   A/Ox4

## 2020-07-18 NOTE — Progress Notes (Signed)
Failed attempt at MRI. Patient terminated the procedure and refused further imaging. Limited MRI of the cervical spine without gadolinium completed.
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
## 2020-07-18 NOTE — ED Notes (Signed)
Patient is currently in MRI. RN will obtain labs and give medications when patient returns from MRI.
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
## 2020-07-19 DIAGNOSIS — B182 Chronic viral hepatitis C: Secondary | ICD-10-CM

## 2020-07-19 DIAGNOSIS — I10 Essential (primary) hypertension: Secondary | ICD-10-CM

## 2020-07-19 DIAGNOSIS — E871 Hypo-osmolality and hyponatremia: Secondary | ICD-10-CM | POA: Diagnosis not present

## 2020-07-19 DIAGNOSIS — I5023 Acute on chronic systolic (congestive) heart failure: Secondary | ICD-10-CM | POA: Diagnosis not present

## 2020-07-19 DIAGNOSIS — M4802 Spinal stenosis, cervical region: Secondary | ICD-10-CM

## 2020-07-19 DIAGNOSIS — K7460 Unspecified cirrhosis of liver: Secondary | ICD-10-CM

## 2020-07-19 DIAGNOSIS — I5022 Chronic systolic (congestive) heart failure: Secondary | ICD-10-CM

## 2020-07-19 LAB — COMPREHENSIVE METABOLIC PANEL
ALT: 17 U/L (ref 0–44)
AST: 25 U/L (ref 15–41)
Albumin: 2.5 g/dL — ABNORMAL LOW (ref 3.5–5.0)
Alkaline Phosphatase: 48 U/L (ref 38–126)
Anion gap: 7 (ref 5–15)
BUN: 18 mg/dL (ref 6–20)
CO2: 23 mmol/L (ref 22–32)
Calcium: 8.2 mg/dL — ABNORMAL LOW (ref 8.9–10.3)
Chloride: 89 mmol/L — ABNORMAL LOW (ref 98–111)
Creatinine, Ser: 1.31 mg/dL — ABNORMAL HIGH (ref 0.61–1.24)
GFR calc Af Amer: >60 mL/min (ref >60)
GFR calc non Af Amer: 59 mL/min — ABNORMAL LOW (ref >60)
Glucose, Bld: 109 mg/dL — ABNORMAL HIGH (ref 70–99)
Potassium: 4.5 mmol/L (ref 3.5–5.1)
Sodium: 119 mmol/L — CL (ref 135–145)
Total Bilirubin: 1 mg/dL (ref 0.3–1.2)
Total Protein: 5.7 g/dL — ABNORMAL LOW (ref 6.5–8.1)

## 2020-07-19 LAB — CERULOPLASMIN: Ceruloplasmin: 26.8 mg/dL (ref 16.0–31.0)

## 2020-07-19 LAB — CBC
HCT: 32.1 % — ABNORMAL LOW (ref 39.0–52.0)
Hemoglobin: 11 g/dL — ABNORMAL LOW (ref 13.0–17.0)
MCH: 29.1 pg (ref 26.0–34.0)
MCHC: 34.3 g/dL (ref 30.0–36.0)
MCV: 84.9 fL (ref 80.0–100.0)
Platelets: 179 10*3/uL (ref 150–400)
RBC: 3.78 MIL/uL — ABNORMAL LOW (ref 4.22–5.81)
RDW: 13.7 % (ref 11.5–15.5)
WBC: 3.3 10*3/uL — ABNORMAL LOW (ref 4.0–10.5)
nRBC: 0 % (ref 0.0–0.2)

## 2020-07-19 LAB — GLUCOSE, CAPILLARY
Glucose-Capillary: 178 mg/dL — ABNORMAL HIGH (ref 70–99)
Glucose-Capillary: 111 mg/dL — ABNORMAL HIGH (ref 70–99)
Glucose-Capillary: 247 mg/dL — ABNORMAL HIGH (ref 70–99)
Glucose-Capillary: 130 mg/dL — ABNORMAL HIGH (ref 70–99)

## 2020-07-19 LAB — COPPER, URINE - RANDOM OR 24 HOUR
Copper / Creatinine Ratio: 62 ug/g creat — ABNORMAL HIGH (ref 0–49)
Copper, Ur: 8 ug/L
Creatinine(Crt),U: 0.13 g/L — ABNORMAL LOW (ref 0.30–3.00)

## 2020-07-19 LAB — MAGNESIUM: Magnesium: 1.8 mg/dL (ref 1.7–2.4)

## 2020-07-19 LAB — SODIUM: Sodium: 121 mmol/L — ABNORMAL LOW (ref 135–145)

## 2020-07-19 IMAGING — DX CHEST - 2 VIEW
2 series · 2 of 2 positions shown · non-contrast
Comparison: 07/16/2019

CLINICAL DATA: Shortness of breath. Lower extremity edema.
Congestive heart failure. Cirrhosis.

EXAM:
CHEST - 2 VIEW

[chest lat]
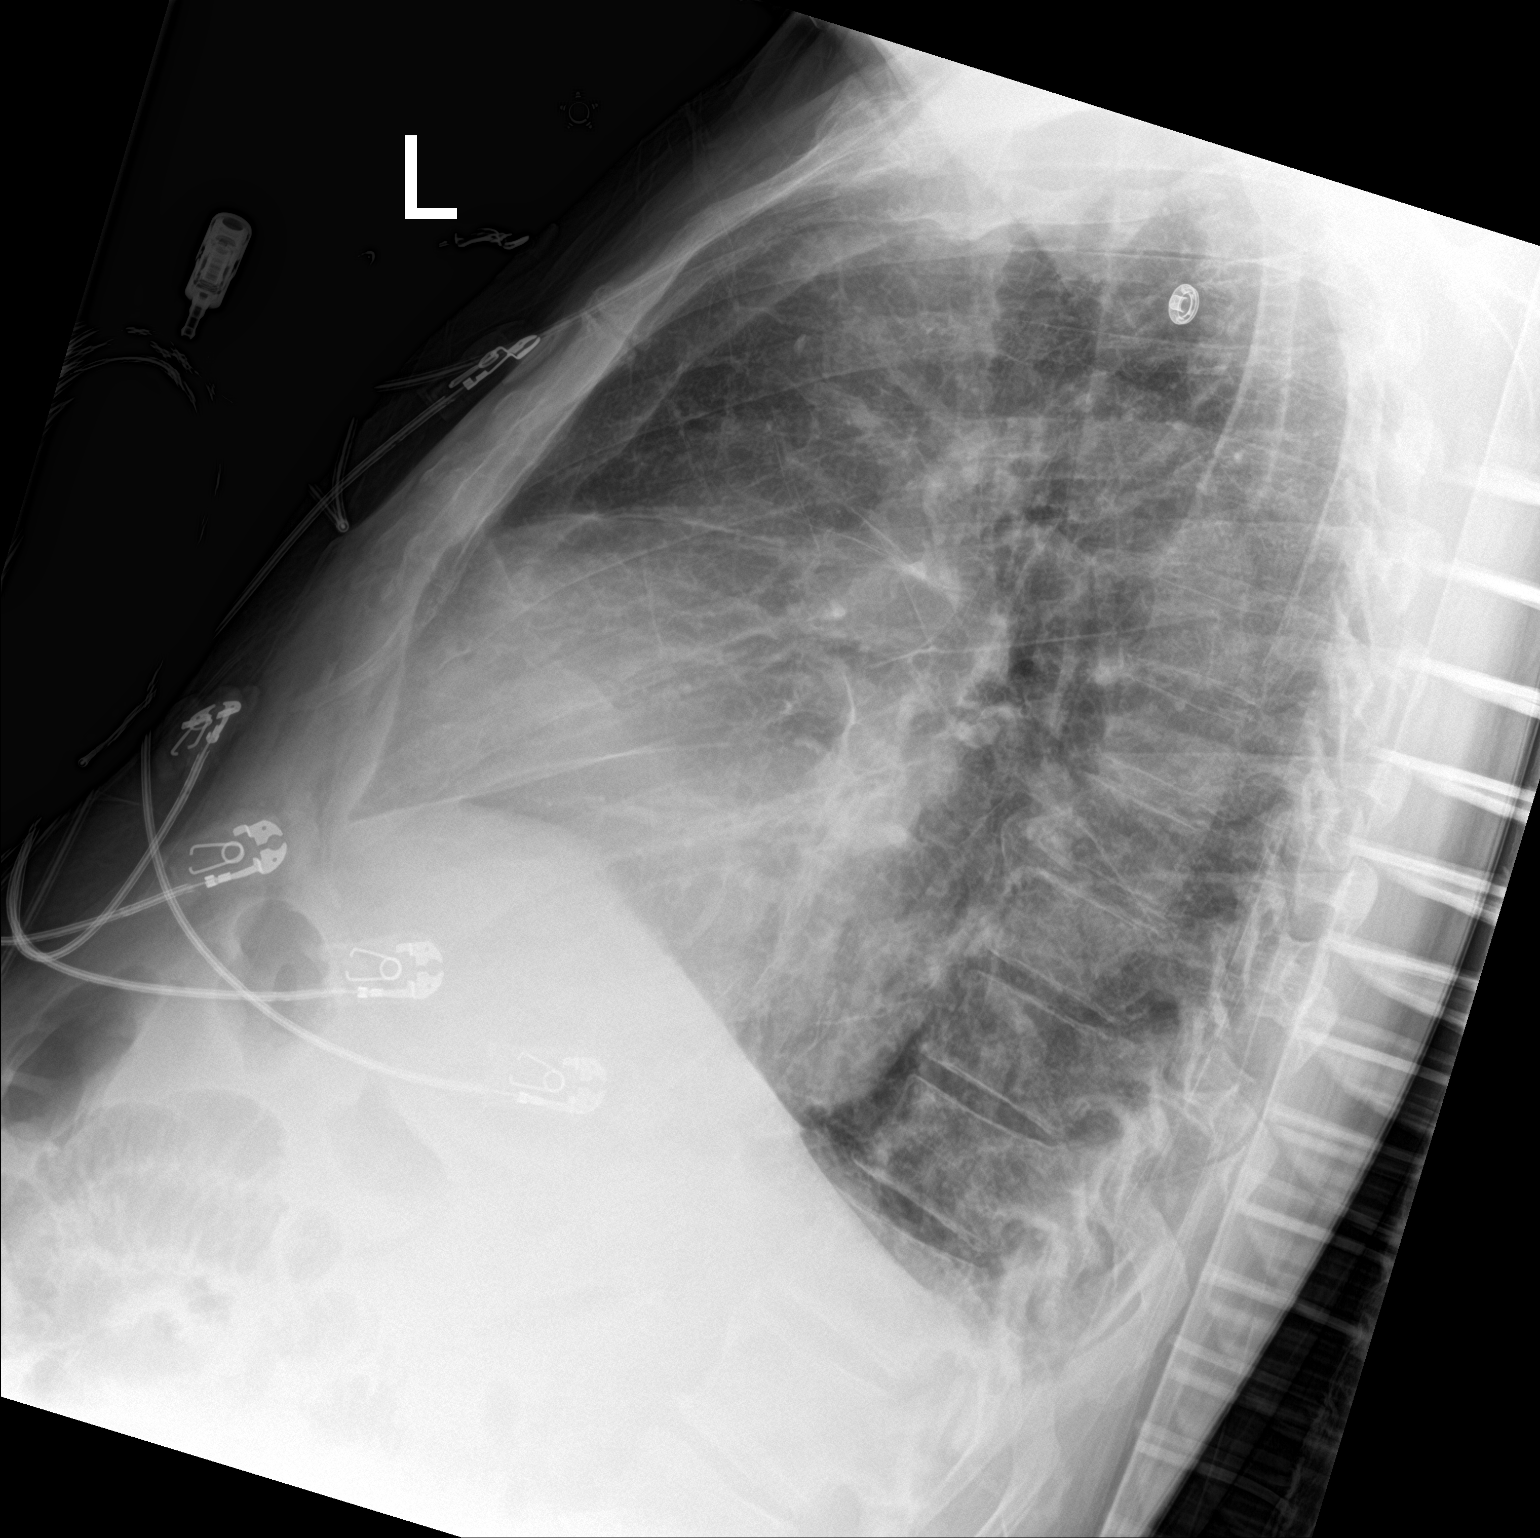

[chest ap]
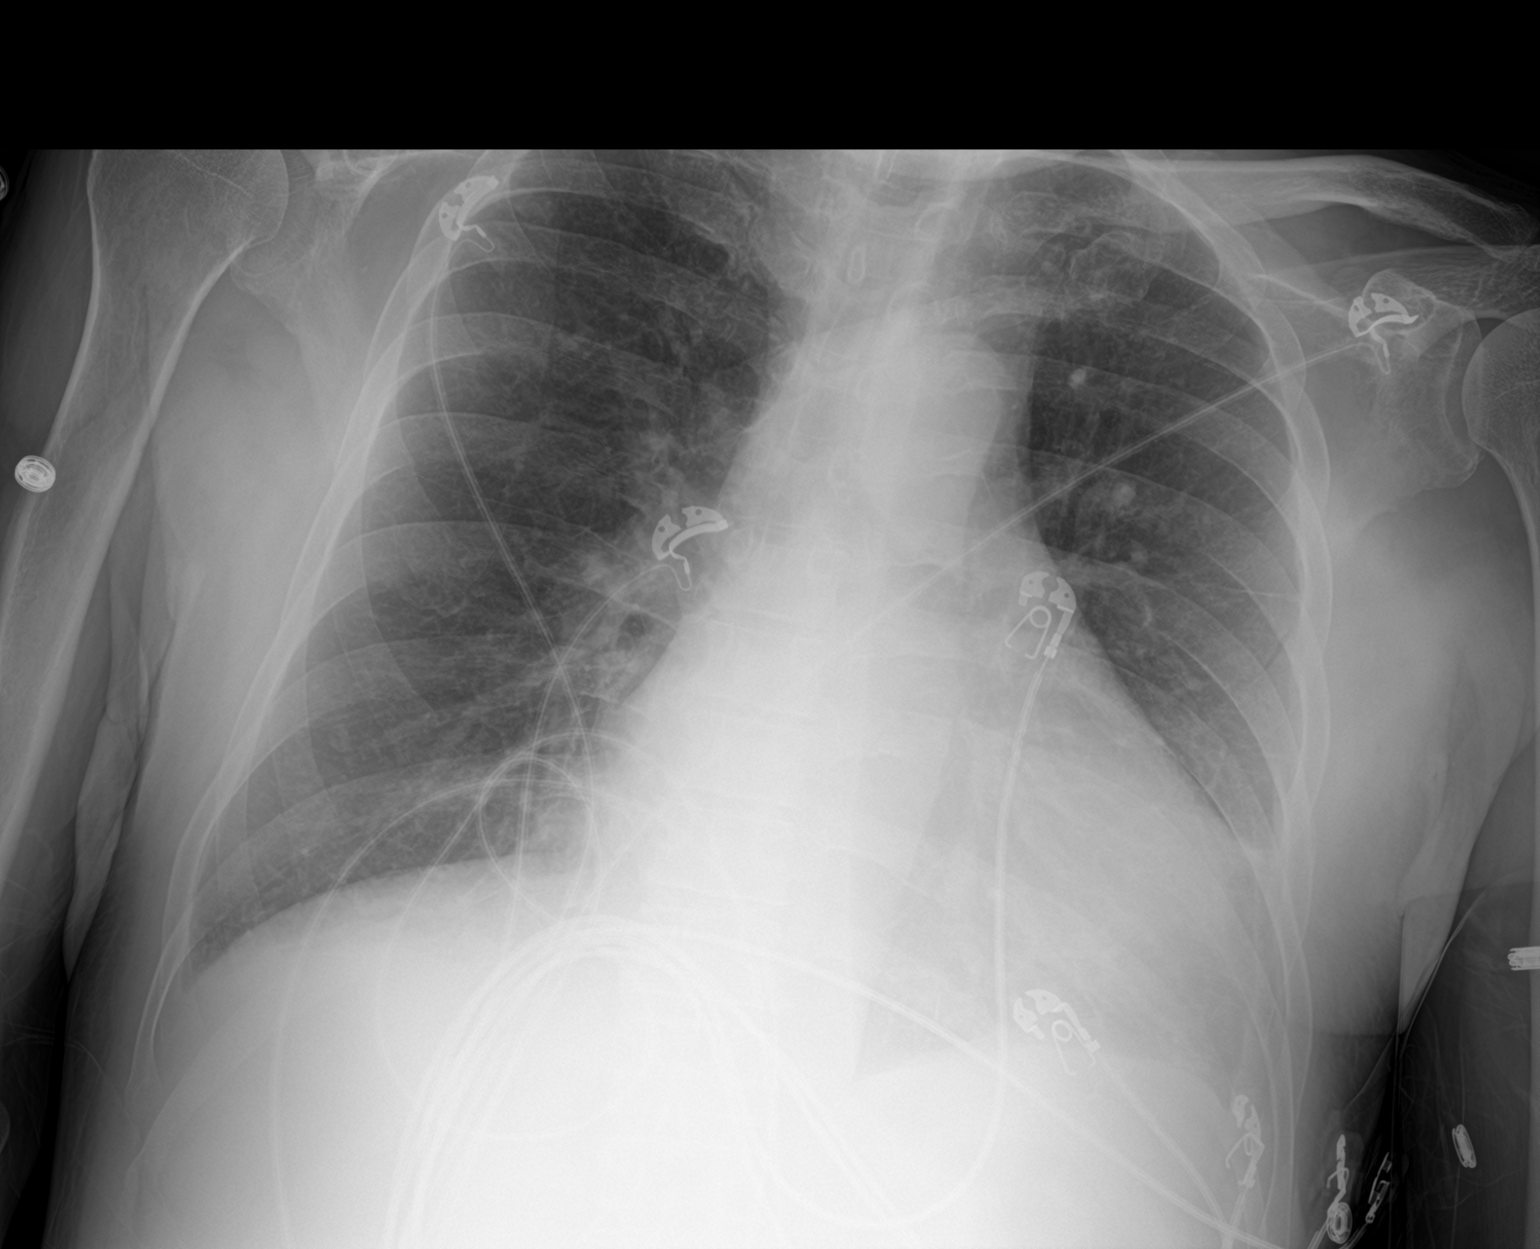

[2 of 2 positions shown; findings below may reference images not displayed]

FINDINGS: Stable moderate cardiomegaly. Stable tiny left pleural effusion
versus pleural thickening. No evidence of pulmonary infiltrate or
edema. No significant change compared to prior study.
IMPRESSION: Stable cardiomegaly and tiny left pleural effusion versus pleural
thickening. No acute findings.

## 2020-07-19 MED ORDER — FUROSEMIDE 10 MG/ML IJ SOLN
60.0000 mg | Freq: Two times a day (BID) | INTRAMUSCULAR | Status: DC
  Administered 2020-07-19 – 2020-07-22 (×5): 60 mg via INTRAVENOUS
  Filled 2020-07-19 (×6): qty 6

## 2020-07-19 MED ORDER — CYANOCOBALAMIN 1000 MCG/ML IJ SOLN
1000.0000 ug | Freq: Every day | INTRAMUSCULAR | Status: AC
  Administered 2020-07-19 – 2020-07-23 (×5): 1000 ug via INTRAMUSCULAR
  Filled 2020-07-19 (×5): qty 1

## 2020-07-19 MED ORDER — ISOSORB DINITRATE-HYDRALAZINE 20-37.5 MG PO TABS
0.5000 | ORAL_TABLET | Freq: Three times a day (TID) | ORAL | Status: DC
  Administered 2020-07-19 – 2020-07-21 (×6): 0.5 via ORAL
  Filled 2020-07-19 (×6): qty 1

## 2020-07-19 MED ORDER — TOLVAPTAN 15 MG PO TABS
15.0000 mg | ORAL_TABLET | Freq: Once | ORAL | Status: AC
  Administered 2020-07-19: 15 mg via ORAL
  Filled 2020-07-19: qty 1

## 2020-07-19 NOTE — Progress Notes (Signed)
Nephrology Follow-Up Consult note   Assessment/Recommendations: Brad [name] is a/an 59 y.o. male with a past medical history significant for HFrEF, HTN, hyponatremia, DM2, alcohol use disorder, HCV w/ cirrhosis, admitted for hyponatremia and spasms of left lower extremity  Severe Hyponatremia: acute on chronic multifactorial w/ poor solute intake and CHF being major contributors. Urine na of 11 and urine osm of 122 consistent with this. Correcting slowly but appropriately.  -Tolvaptan 15mg  given per Cardiology; reasonable but monitor closely for hepatotoxicity and over correction of Na; appreciate cardiology assistance -Continue fluid restriction 1.2 L -Continue lasix IV BID -q4-6hr Na checks -Avoiding salt tablets for now given volume overload -Goal correction today is no great than Na of 125  CKD: Crt 1 yesterday likely falsely low due to dilution. Has CKD based on prior values and is at baseline -Continue to monitor daily Cr, Dose meds for GFR -Monitor Daily I/Os, Daily weight  -Maintain MAP>65 for optimal renal perfusion.  -Avoid nephrotoxic agents including NSAID as possible  Volume Status: Appears volume overloaded on exam. Based on our examination and review of available imaging, our recommendation is continue diuretics as outlined by cardiology.  Hypoalbuminemia: related to cirrhosis. Avoid IV albumin for now as this may cause intravascular fluid increase leading to worsening hyponatremia  Leg Jerking/Cervical Stenosis: tentative plans for surgery. Cardiology assisting with pre-op. Ideally would improve Na before surgery   Cirrhosis 2/2 alcohol and hep C. Compensated DM2 per primary team   Recommendations conveyed to primary service.    [facility] 07/19/2020 2:34 PM  ___________________________________________________________  CC: Hyponatremia, CKD  Interval History/Subjective: Na improved quickly from 116 to 121 now 119 at 11am  today. No obvious abnormal neurological symptoms. Crt increased back to around his baseline. Patient denies complaints of CP and SOB. UOP not well documented. Seen by cardiology and ordered for tolvaptan. Continues to have leg spasms and seen by neurosurgery due to finding of severe cervical spinal stenosis   Medications:  Current Facility-Administered Medications  Medication Dose Route Frequency Provider Last Rate Last Admin  . acetaminophen (TYLENOL) tablet 650 mg  650 mg Oral Q6H PRN Kc, Ramesh, MD       Or  . acetaminophen (TYLENOL) suppository 650 mg  650 mg Rectal Q6H PRN Kc, Ramesh, MD      . amiodarone (PACERONE) tablet 200 mg  200 mg Oral Daily Kc, Ramesh, MD   200 mg at 07/19/20 0809  . atorvastatin (LIPITOR) tablet 20 mg  20 mg Oral q1800 Kc, Ramesh, MD   20 mg at 07/18/20 1818  . cyanocobalamin ((VITAMIN B-12)) injection 1,000 mcg  1,000 mcg Intramuscular Daily Alekh, Kshitiz, MD      . folic acid (FOLVITE) tablet 1 mg  1 mg Oral Daily Kc, Ramesh, MD   1 mg at 07/19/20 0809  . furosemide (LASIX) injection 60 mg  60 mg Intravenous BID Larey Dresser, MD      . gabapentin (NEURONTIN) capsule 600 mg  600 mg Oral QHS Antonieta Pert, MD   600 mg at 07/18/20 2248  . HYDROcodone-acetaminophen (NORCO/VICODIN) 5-325 MG per tablet 1 tablet  1 tablet Oral Q4H PRN Antonieta Pert, MD   1 tablet at 07/19/20 0908  . insulin aspart (novoLOG) injection 0-9 Units  0-9 Units Subcutaneous TID WC Antonieta Pert, MD   2 Units at 07/19/20 0810  . isosorbide-hydrALAZINE (BIDIL) 20-37.5 MG per tablet 0.5 tablet  0.5 tablet Oral TID Larey Dresser, MD      .  lidocaine (LIDODERM) 5 % 1 patch  1 patch Transdermal Q24H Antonieta Pert, MD   1 patch at 07/18/20 1810  . LORazepam (ATIVAN) tablet 1-4 mg  1-4 mg Oral Q1H PRN Antonieta Pert, MD       Or  . LORazepam (ATIVAN) injection 1-4 mg  1-4 mg Intravenous Q1H PRN Kc, Ramesh, MD      . methocarbamol (ROBAXIN) tablet 500 mg  500 mg Oral Q6H PRN Kc, Ramesh, MD   500 mg at  07/18/20 2247  . multivitamin with minerals tablet 1 tablet  1 tablet Oral Daily Antonieta Pert, MD   1 tablet at 07/19/20 0810  . ondansetron (ZOFRAN) tablet 4 mg  4 mg Oral Q6H PRN Kc, Ramesh, MD       Or  . ondansetron (ZOFRAN) injection 4 mg  4 mg Intravenous Q6H PRN Kc, Ramesh, MD   4 mg at 07/19/20 1114  . promethazine (PHENERGAN) injection 6.25 mg  6.25 mg Intravenous Q6H PRN Kc, Ramesh, MD      . senna-docusate (Senokot-S) tablet 1 tablet  1 tablet Oral QHS PRN Kc, Ramesh, MD      . thiamine tablet 100 mg  100 mg Oral Daily Kc, Ramesh, MD   100 mg at 07/19/20 0809   Or  . thiamine (B-1) injection 100 mg  100 mg Intravenous Daily Kc, Ramesh, MD      . tolvaptan (SAMSCA) tablet 15 mg  15 mg Oral Once Larey Dresser, MD      . traMADol Veatrice Bourbon) tablet 50 mg  50 mg Oral Q8H PRN Antonieta Pert, MD   50 mg at 07/19/20 0035      Review of Systems: 10 systems reviewed and negative except per interval history/subjective  Physical Exam: Vitals:   07/19/20 0753 07/19/20 1100  BP: 115/73 (!) 122/88  Pulse: 74 60  Resp: 18 18  Temp: 97.8 F (36.6 C) 98.2 F (36.8 C)  SpO2: 98% 100%   No intake/output data recorded.  Intake/Output Summary (Last 24 hours) at 07/19/2020 1434 Last data filed at 07/18/2020 2302 Gross per 24 hour  Intake 1000 ml  Output --  Net 1000 ml   Constitutional: well-appearing, no acute distress ENMT: ears and nose without scars or lesions, MMM CV: normal rate, diffuse mild to moderate pitting edema on chest and arms with 1-2+ pitting edema in BLE Respiratory: expiratory wheezing in apices, normal work of breathing Gastrointestinal: soft, non-tender, no palpable masses or hernias Skin: no visible lesions or rashes Psych: alert, judgement/insight appropriate, appropriate mood and affect   Test Results I personally reviewed new and old clinical labs and radiology tests Lab Results  Component Value Date   NA 119 (LL) 07/19/2020   K 4.5 07/19/2020   CL 89 (L)  07/19/2020   CO2 23 07/19/2020   BUN 18 07/19/2020   CREATININE 1.31 (H) 07/19/2020   CALCIUM 8.2 (L) 07/19/2020   ALBUMIN 2.5 (L) 07/19/2020   PHOS 4.3 06/21/2020

## 2020-07-19 NOTE — Progress Notes (Signed)
Patient ID: Brad [name], male   DOB: Aug 12, 1961, 59 y.o.   MRN: [id number]  PROGRESS NOTE    Jospeh Mangel  YKD:[id number] DOB: [birth year] DOA: 07/18/2020 PCP: Charlott Rakes, MD   Brief Narrative:  59 year old male with history of chronic systolic heart failure with EF of 35 to 40% on echo on 04/03/2020, hypertension, chronic hyponatremia, alcohol abuse, type 2 diabetes mellitus, history of hepatitis C virus with liver cirrhosis, depression, recent multiple hospitalizations with rib pain/chest pain hyponatremia presented with severe right rib pain along with continued jerking of the right leg and other body parts as well.  In the ED, sodium was 116, he was started on IV fluids.  Nephrology was consulted.  Chest x-ray showed mildly displaced fracture of the anterolateral right fifth rib and nondisplaced anterior right severe right sixth rib fracture.  Neurology was consulted for?  Myoclonic jerking.  EEG did not show any seizure.  MRI of the cervical spine showed severe spinal stenosis at C4-C5 C5-C6 levels with moderate cord mass-effect.  Neurosurgery was consulted who recommended that patient be transferred to [facility]:   Acute on chronic hyponatremia -Presented with a sodium level of 116.  Recent admission with similar hyponatremia with history of chronic hyponatremia and upon discharge, sodium level was 129 after a fluid restriction and IV Lasix.  He had also received a dose of tolvaptan at one point -Nephrology following.  Last sodium level was 121 last evening.  Sodium level pending this morning.  Received some IV fluids in the ED.  Torsemide and Aldactone on hold.  Continue fluid restriction.  Severe cervical stenosis at C4-C5 and C5-C6 levels with moderate cord mass-effect Abnormal jerking movements of extremities -MRI of the cervical spine with findings as above.  Neurosurgery following.  Might need surgical intervention once medically optimized.  Will  call cardiology consultation for clearance as per neurosurgery request. -Still having some jerky movements of right leg.  Neurology evaluation appreciated: Neurology has signed off.  EEG did not show any seizure activity.  Right rib pain secondary to right rib fracture from a fall more than a month ago -Chest x-ray showed Mildly displaced fracture of the anterolateral right rib fifth rib perhaps marginally more displaced than on prior study.Nondisplaced anterior severe right sixth rib fracture.  -Continue lidocaine patch, tramadol and hydrocodone for pain control.  Patient keeps requesting for morphine.  I remember him from recent hospitalization during which he kept asking were something with "M" and would later ask for morphine continuously.  Chronic systolic CHF with EF of 35 to 40% on echo on 04/03/2020 -Appears compensated. -Diuretics on hold for now.  Strict input and output.  Daily weights.  Fluid restriction.  Cardiology consultation.  Essential hypertension -Blood pressure intermittently elevated.  Aldactone and torsemide on hold for now  Paroxysmal A. Fib -Continue amiodarone.  Aspirin discontinued by neurosurgery for possible need for surgical intervention.  Currently rate controlled  Alcohol abuse -Monitor signs of withdrawal.  Continue CIWA protocol.  Social worker consult.  Reports that he has not been drinking much recently  Chronic HCV with liver cirrhosis -LFTs stable on presentation.  Outpatient follow-up.  No signs of decompensation at this point  Mild leukopenia -Probably from cirrhosis.  Monitor  Anemia of chronic disease -Probably from cirrhosis.  Hemoglobin stable.  Monitor  Generalized deconditioning -will need PT eval once cleared by neurosurgery    DVT prophylaxis: SCDs.  Hold Lovenox in case patient needs surgical intervention Code Status:  Full Family Communication: Spoke to patient at bedside Disposition Plan: Status is: Inpatient  Remains inpatient  appropriate because:Inpatient level of care appropriate due to severity of illness   Dispo: The patient is from: Home              Anticipated d/c is to: Home              Anticipated d/c date is: > 3 days              Patient currently is not medically stable to d/c.   Consultants: Neurology/nephrology/neurosurgery  Procedures: EEG on 07/18/2020: No seizures  Antimicrobials: None   Subjective: Patient seen and examined at bedside.  Poor historian.  Denies current nausea, vomiting, overnight fever or worsening shortness of breath.  Complains of pain all over and is requesting for morphine.  Objective: Vitals:   07/18/20 2248 07/19/20 0030 07/19/20 0551 07/19/20 0753  BP: (!) 107/88 (!) 137/98 105/80 115/73  Pulse: 80 84 68 74  Resp: 16  17 18   Temp: 97.9 F (36.6 C) 98.6 F (37 C) 98.3 F (36.8 C) 97.8 F (36.6 C)  TempSrc: Oral Oral Oral Oral  SpO2: 98% 93% 100% 98%  Weight: 77.1 kg 81.6 kg 81.6 kg   Height: 5\' 11"  (1.803 m) 5\' 11"  (1.803 m)      Intake/Output Summary (Last 24 hours) at 07/19/2020 1043 Last data filed at 07/18/2020 2302 Gross per 24 hour  Intake 1000 ml  Output -  Net 1000 ml   Filed Weights   07/18/20 2248 07/19/20 0030 07/19/20 0551  Weight: 77.1 kg 81.6 kg 81.6 kg    Examination:  General exam: Appears calm and comfortable.  Poor historian.  Chronically ill looking and looks older than stated age Respiratory system: Bilateral decreased breath sounds at bases with some scattered crackles Cardiovascular system: S1 & S2 heard, Rate controlled Gastrointestinal system: Abdomen is nondistended, soft and nontender. Normal bowel sounds heard. Extremities: No cyanosis, clubbing; trace lower extremity edema Central nervous system: Alert and oriented. No focal neurological deficits. Moving extremities.  Poor historian.  Has intermittent right lower extremity jerking movement Skin: No rashes, lesions or ulcers Psychiatry: Could not be assessed because of  patient being a poor historian.  Flat affect    Data Reviewed: I have personally reviewed following labs and imaging studies  CBC: Recent Labs  Lab 07/18/20 1000 07/18/20 1803  WBC 3.2* 3.7*  HGB 11.6* 11.8*  HCT 33.3* 33.9*  MCV 84.9 85.6  PLT 239 638   Basic Metabolic Panel: Recent Labs  Lab 07/18/20 1000 07/18/20 1803  NA 116* 121*  K 4.3 4.4  CL 82* 88*  CO2 21* 21*  GLUCOSE 146* 142*  BUN 15 16  CREATININE 1.00 0.98  CALCIUM 8.1* 8.1*   GFR: Estimated Creatinine Clearance: 85.4 mL/min (by C-G formula based on SCr of 0.98 mg/dL). Liver Function Tests: Recent Labs  Lab 07/18/20 1803  AST 27  ALT 20  ALKPHOS 50  BILITOT 0.8  PROT 6.0*  ALBUMIN 2.7*   No results for input(s): LIPASE, AMYLASE in the last 168 hours. Recent Labs  Lab 07/18/20 1415  AMMONIA 13   Coagulation Profile: No results for input(s): INR, PROTIME in the last 168 hours. Cardiac Enzymes: No results for input(s): CKTOTAL, CKMB, CKMBINDEX, TROPONINI in the last 168 hours. BNP (last 3 results) No results for input(s): PROBNP in the last 8760 hours. HbA1C: No results for input(s): HGBA1C in the last 72 hours. CBG:  Recent Labs  Lab 07/18/20 1759 07/19/20 0751  GLUCAP 134* 178*   Lipid Profile: No results for input(s): CHOL, HDL, LDLCALC, TRIG, CHOLHDL, LDLDIRECT in the last 72 hours. Thyroid Function Tests: Recent Labs    07/18/20 1803  TSH 4.249   Anemia Panel: Recent Labs    07/18/20 1803  VITAMINB12 160*  FOLATE 17.1   Sepsis Labs: No results for input(s): PROCALCITON, LATICACIDVEN in the last 168 hours.  Recent Results (from the past 240 hour(s))  SARS Coronavirus 2 by RT PCR (hospital order, performed in [facility] hospital lab) Nasopharyngeal Urine, Clean Catch     Status: None   Collection Time: 07/18/20 12:23 PM   Specimen: Urine, Clean Catch; Nasopharyngeal  Result Value Ref Range Status   SARS Coronavirus 2 NEGATIVE NEGATIVE Final    Comment: (NOTE)  SARS-CoV-2 target nucleic acids are NOT DETECTED.  The SARS-CoV-2 RNA is generally detectable in upper and lower respiratory specimens during the acute phase of infection. The lowest concentration of SARS-CoV-2 viral copies this assay can detect is 250 copies / mL. A negative result does not preclude SARS-CoV-2 infection and should not be used as the sole basis for treatment or other patient management decisions.  A negative result may occur with improper specimen collection / handling, submission of specimen other than nasopharyngeal swab, presence of viral mutation(s) within the areas targeted by this assay, and inadequate number of viral copies (<250 copies / mL). A negative result must be combined with clinical observations, patient history, and epidemiological information.  Fact Sheet for Patients:   StrictlyIdeas.no  Fact Sheet for Healthcare Providers: BankingDealers.co.za  This test is not yet approved or  cleared by the [place] FDA and has been authorized for detection and/or diagnosis of SARS-CoV-2 by FDA under an Emergency Use Authorization (EUA).  This EUA will remain in effect (meaning this test can be used) for the duration of the COVID-19 declaration under Section 564(b)(1) of the Act, 21 U.S.C. section 360bbb-3(b)(1), unless the authorization is terminated or revoked sooner.  Performed at [facility], [place] [address], [place], [place] [address]          Radiology Studies: EEG  Result Date: 07/18/2020 Lora Havens, MD     07/18/2020  6:08 PM Patient Name: Guilherme Schwenke MRN: [id number] Epilepsy Attending: Lora Havens Referring Physician/Provider: Dr. Davonna Belling Date: 07/18/2020 Duration: 24.59 mins Patient history: 59 year old male who presented to the ED with complaints of right-sided rib pain.  In the ED was noted to have sodium of 116.  While in the ED was also noted to have  rhythmic right-sided jerking occurring approximately every 15 seconds.  EEG to evaluate for seizures. Level of alertness: Awake, asleep AEDs during EEG study: None Technical aspects: This EEG study was done with scalp electrodes positioned according to the 10-20 International system of electrode placement. Electrical activity was acquired at a sampling rate of 500Hz  and reviewed with a high frequency filter of 70Hz  and a low frequency filter of 1Hz . EEG data were recorded continuously and digitally stored. Description: The posterior dominant rhythm consists of 8.5 Hz activity of moderate voltage (25-35 uV) seen predominantly in posterior head regions, symmetric and reactive to eye opening and eye closing. Sleep was characterized by vertex waves, sleep spindles (12 to 14 Hz), maximal frontocentral region. Hyperventilation and photic stimulation were not performed.   Multiple episodes of intermittent brief right leg contraction/twitching were recorded during the EEG.  Concomitant EEG before, during and  after the episode did not show any EEG change to suggest seizure. IMPRESSION: This study is within normal limits. No seizures or epileptiform discharges were seen throughout the recording. Multiple episodes of brief right lower extremity twitching were recorded without concomitant EEG change and were most likely nonepileptic. Lora Havens   DG Ribs Bilateral W/Chest  Result Date: 07/18/2020 CLINICAL DATA:  Pain following recent fall EXAM: BILATERAL RIBS AND CHEST - 4+ VIEW COMPARISON:  June 30, 2020 FINDINGS: Frontal chest as well as oblique and cone-down rib images bilaterally obtained. Lungs are clear. Heart is upper normal in size with pulmonary vascularity normal. No adenopathy. There is a displaced fracture of the anterolateral right fifth rib. There is a nondisplaced fracture of the anterior right sixth rib. No other fractures are evident. There is no appreciable pneumothorax or pleural effusion on the  right. IMPRESSION: Mildly displaced fracture of the anterolateral right fifth rib, perhaps marginally more displaced than on prior study. Nondisplaced fracture anterior right sixth rib. No other fractures are evident. No pneumothorax or pleural effusion. Lungs clear. Heart upper normal in size. Electronically Signed   By: Lowella Grip III M.D.   On: 07/18/2020 11:24   MR CERVICAL SPINE WO CONTRAST  Result Date: 07/18/2020 CLINICAL DATA:  59 year old male status post fall 2 weeks ago with acute or progressive myelopathy. EXAM: MRI CERVICAL SPINE WITHOUT CONTRAST TECHNIQUE: Multiplanar, multisequence MR imaging of the cervical spine was performed. No intravenous contrast was administered. COMPARISON:  Cervical spine CT 05/03/2020. FINDINGS: Study is mildly degraded by motion artifact despite repeated imaging attempts, and no diagnostic axial GRE images could be obtained. Alignment: Straightening of cervical lordosis is stable from the May CT. No significant spondylolisthesis. Vertebrae: No marrow edema or evidence of acute osseous abnormality. Visualized bone marrow signal is within normal limits. Cord: Limited cervical spinal cord detail due to motion artifact, but suggestion of cord T2 heterogeneity at both C4-C5 and C5-C6 where significant degenerative spinal stenosis and cord compression are noted (see below) suspicious for myelomalacia. Above and below those levels cord signal and morphology appears within normal limits. Posterior Fossa, vertebral arteries, paraspinal tissues: Cervicomedullary junction appears within normal limits. There is some inferior left cerebellar encephalomalacia suspected (series 6, image 14). Grossly negative visible neck soft tissues, lung apices. Disc levels: C2-C3: Disc bulging and endplate spurring is mild. No spinal stenosis. There is mild C3 foraminal stenosis. C3-C4: Mild disc bulging and endplate spurring. Right neural foramen most affected. Moderate to severe right C4  foraminal stenosis. No spinal stenosis. C4-C5: Mild disc space loss but bulky broad-based posterior disc osteophyte complex. This is mildly lobulated and results in moderate to severe spinal stenosis with at least moderate cord mass effect (series 7, image 17). Superimposed severe bilateral C5 foraminal stenosis. C5-C6: Disc space loss with similar bulky disc osteophyte complex, broad-based mildly lobulated posterior component. Moderate to severe spinal stenosis and at least moderate cord mass effect (series 7, image 21). Severe bilateral C6 foraminal stenosis. C6-C7: Mild circumferential disc bulge and endplate spurring. Mild to moderate posterior element hypertrophy. Borderline to mild spinal stenosis. No cord mass effect. Mild left and moderate right C7 foraminal stenosis. C7-T1:  Mild facet hypertrophy.  No significant stenosis. Negative visible upper thoracic levels. IMPRESSION: 1. Bulky disc osteophyte degeneration at both C4-C5 and C5-C6 with moderate to severe spinal stenosis at both levels, at least moderate cord mass effect. Suspicion of abnormal cord signal (myelomalacia versus cord edema from recent fall) although suboptimal cord detail due  to motion artifacts. 2. No other significant spinal stenosis despite additional degeneration. There is moderate or severe degenerative neural foraminal stenosis at the right C4, bilateral C5 and C6, and right C7 nerve levels. Salient findings on this exam were reviewed in person with Dr. Roland Rack on 07/18/2020 at 18:10 . Electronically Signed   By: Genevie Ann M.D.   On: 07/18/2020 18:15        Scheduled Meds: . amiodarone  200 mg Oral Daily  . atorvastatin  20 mg Oral q1800  . folic acid  1 mg Oral Daily  . gabapentin  600 mg Oral QHS  . insulin aspart  0-9 Units Subcutaneous TID WC  . lidocaine  1 patch Transdermal Q24H  . multivitamin with minerals  1 tablet Oral Daily  . sodium chloride  1 g Oral BID WC  . thiamine  100 mg Oral Daily   Or   . thiamine  100 mg Intravenous Daily   Continuous Infusions:        Aline August, MD [facility] 07/19/2020, 10:43 AM

## 2020-07-19 NOTE — Consult Note (Addendum)
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 implant.  4. CAD  - recent [facility] 6/21 showed mild nonobstructive CAD.   Ost Ramus to Ramus lesion is 25% stenosed.  Prox Cx to Mid Cx lesion is 50% stenosed.  Ost LM lesion is 30% stenosed. - no ischemic symptoms.  - continue medical therapy.  - ok to hold ASA perioperatively if needed - continue statin therapy, atorvastatin 20 mg  - not on  blocker w/ h/o bradycardia   5. Chronic Atrial Fibrillation  - rate controlled on tele.  - continue amiodarone for rate control - not on  blocker w/ bradycardia - not on a/c given heavy ETOH use and frequent falls   6.  Aortic Stenosis - mild by most recent echo. Aortic valve mean gradient measures 3.5 mmHg.  Aortic valve area, by VTI measures 1.82 cm.  7. Preoperative Risk Assessment   - going for noncardiac surgery   - mild volume overload on exam but relatively well compensated systolic HF. NYHA Class II symptoms and no ischemic CP. Recent [facility] 6/21 showed mild-mod nonobstructive CAD. He is able to perform > 4 METs of physical activity (walk > 4 blocks) w/o exertional CP or dyspnea.  - while he is at slightly higher risk of  complications given his heart failure, surgery is not prohibitive.  - ok to hold ASA during the perioperative period if needed - continue statin during the perioperative period - recommend judicious use of IVFs during perioperative period given LV dysfunction  - will need continuous telemetry monitoring  - we will follow along closely w/ you   Length of Stay: 1  Brittainy Simmons, PA-C  07/19/2020, 10:26 AM  Advanced Heart Failure Team Pager [phone] (M-F; 7a - 4p)  Please contact [facility] for night-coverage after hours (4p -7a ) and weekends on [email or web address]  Patient seen with PA, agree with the above note.   He was admitted with right-sided chest pain, likely from prior rib fractures. However, he was also found to have myoclonic jerking in his right leg and MRI c-spine has shown severe c-spine stenosis with cord compression.  He needs cervical fusion.    He reports compliance with meds at home.  Says he is drinking less beer.  In the past, very significant po fluid intake with chronic hyponatremia.  This admission, Na was 116.  No altered mental status, seizures, etc.    General: NAD Neck: JVP 14-16 cm, no thyromegaly or thyroid nodule.  Lungs: Clear to auscultation bilaterally with normal respiratory effort. CV: Nondisplaced PMI.  Heart regular S1/S2, no S3/S4, 2/6 HSM LLSB/apex.  1+ ankle edema.  Abdomen: Soft, nontender, no hepatosplenomegaly, no distention.  Skin: Intact without lesions or rashes.  Neurologic: Alert and oriented x 3.  Psych: Normal affect. Extremities: No clubbing or cyanosis.  HEENT: Normal.   1. Pre-operative evaluation: Patient had cath in 6/21 with nonobstructive coronary disease (and elevated filling pressures).  He has atypical, non-cardiac chest pain (probably from prior rib fractures).  Currently, he is volume overloaded on exam and also significantly hyponatremic (119 last check).  He needs cervical fusion for c-spine stenosis and cord compression.   I think that surgery will be reasonable once his sodium level is controlled (would like to see it up to at least 125 before he goes to the OR, as fluid shifts in the OR may lead the sodium to fall a bit).  2. Hyponatremia: Recurrent. Sodium 116 at admission, 119 most recently today.He is volume overloaded on exam with JVP to earlobe while sitting up, suspect ongoing excessive po fluid intake at home. This appears to be hypervolemic hyponatremia (he is NOT hypovolemic).  - With volume overload, would avoid salt tablets and further IV fluid.  - Tolvaptan 15 mg x 1 now.  He has tolerated well in the past and LFTs are normal.  - Diurese gently with IV Lasix as below.  - Follow Na carefully, avoid correction by more than 6-7 mmol/L per day.  2. Acute on chronic systolic CHF: Echo (3/79) EF 35%, moderately reduced RV fxn, moderate-severe Brad, moderate TR. Nonischemic cardiomyopathy with nonobstructive CAD on 6/21 cath, probably due to substance abuse (ETOH, prior cocaine). He is volume overloaded on exam with JVP to earlobes.  - Lasix 60 mg IV bid, start now.  - Tolvaptan 15 mg x 1 dose as above.  - He has been off beta blocker with bradycardia.   - Restart Bidil at 0.5 tab tid, should have BP room.  3. Atrial fibrillation: Chronic. He has been off anticoagulation due toheavyETOH abuse and frequent falls.  -Off Coreg with low HR.  - Without anticoagulation, DCCV is not an option. - If he quits drinking and improves stability, start Eliquis.  4. ETOH abuse: Ideally needs rehab program. Still drinking some at home, says he has cut back a lot.  5. PVCs: Amiodarone begun at last appointment due to frequent PVCs.   - Continue amiodarone.  - Follow on telemetry.  6. Syncope: Has had in the past, has fractured ribs.  Hard to know if this has been due to ETOH intoxication or arrhythmias.  He has been deemed not a candidate for ICD due to ETOH abuse.   Loralie Champagne 07/19/2020 1:34 PM

## 2020-07-20 ENCOUNTER — Other Ambulatory Visit: Payer: Self-pay | Admitting: Neurological Surgery

## 2020-07-20 DIAGNOSIS — I482 Chronic atrial fibrillation, unspecified: Secondary | ICD-10-CM | POA: Diagnosis not present

## 2020-07-20 DIAGNOSIS — I5043 Acute on chronic combined systolic (congestive) and diastolic (congestive) heart failure: Secondary | ICD-10-CM | POA: Diagnosis not present

## 2020-07-20 DIAGNOSIS — E871 Hypo-osmolality and hyponatremia: Secondary | ICD-10-CM | POA: Diagnosis not present

## 2020-07-20 LAB — COMPREHENSIVE METABOLIC PANEL
ALT: 17 U/L (ref 0–44)
AST: 25 U/L (ref 15–41)
Albumin: 2.5 g/dL — ABNORMAL LOW (ref 3.5–5.0)
Alkaline Phosphatase: 52 U/L (ref 38–126)
Anion gap: 8 (ref 5–15)
BUN: 20 mg/dL (ref 6–20)
CO2: 24 mmol/L (ref 22–32)
Calcium: 8.3 mg/dL — ABNORMAL LOW (ref 8.9–10.3)
Chloride: 92 mmol/L — ABNORMAL LOW (ref 98–111)
Creatinine, Ser: 1.5 mg/dL — ABNORMAL HIGH (ref 0.61–1.24)
GFR calc Af Amer: 58 mL/min — ABNORMAL LOW (ref >60)
GFR calc non Af Amer: 50 mL/min — ABNORMAL LOW (ref >60)
Glucose, Bld: 141 mg/dL — ABNORMAL HIGH (ref 70–99)
Potassium: 4.5 mmol/L (ref 3.5–5.1)
Sodium: 124 mmol/L — ABNORMAL LOW (ref 135–145)
Total Bilirubin: 0.6 mg/dL (ref 0.3–1.2)
Total Protein: 5.8 g/dL — ABNORMAL LOW (ref 6.5–8.1)

## 2020-07-20 LAB — CBC
HCT: 31.8 % — ABNORMAL LOW (ref 39.0–52.0)
Hemoglobin: 10.7 g/dL — ABNORMAL LOW (ref 13.0–17.0)
MCH: 29 pg (ref 26.0–34.0)
MCHC: 33.6 g/dL (ref 30.0–36.0)
MCV: 86.2 fL (ref 80.0–100.0)
Platelets: 177 10*3/uL (ref 150–400)
RBC: 3.69 MIL/uL — ABNORMAL LOW (ref 4.22–5.81)
RDW: 14 % (ref 11.5–15.5)
WBC: 3.3 10*3/uL — ABNORMAL LOW (ref 4.0–10.5)
nRBC: 0 % (ref 0.0–0.2)

## 2020-07-20 LAB — GLUCOSE, CAPILLARY
Glucose-Capillary: 139 mg/dL — ABNORMAL HIGH (ref 70–99)
Glucose-Capillary: 134 mg/dL — ABNORMAL HIGH (ref 70–99)
Glucose-Capillary: 292 mg/dL — ABNORMAL HIGH (ref 70–99)
Glucose-Capillary: 90 mg/dL (ref 70–99)

## 2020-07-20 LAB — SODIUM: Sodium: 125 mmol/L — ABNORMAL LOW (ref 135–145)

## 2020-07-20 MED ORDER — POLYETHYLENE GLYCOL 3350 17 G PO PACK
17.0000 g | PACK | Freq: Every day | ORAL | Status: DC | PRN
  Administered 2020-07-22 – 2020-07-23 (×2): 17 g via ORAL
  Filled 2020-07-20 (×2): qty 1

## 2020-07-20 MED ORDER — HEPARIN SODIUM (PORCINE) 5000 UNIT/ML IJ SOLN
5000.0000 [IU] | Freq: Three times a day (TID) | INTRAMUSCULAR | Status: AC
  Administered 2020-07-20 – 2020-07-23 (×11): 5000 [IU] via SUBCUTANEOUS
  Filled 2020-07-20 (×10): qty 1

## 2020-07-20 MED ORDER — SENNOSIDES-DOCUSATE SODIUM 8.6-50 MG PO TABS
1.0000 | ORAL_TABLET | Freq: Two times a day (BID) | ORAL | Status: DC
  Administered 2020-07-20 – 2020-07-27 (×13): 1 via ORAL
  Filled 2020-07-20 (×13): qty 1

## 2020-07-20 MED ORDER — BISACODYL 10 MG RE SUPP: 10.0000 mg | Freq: Every day | RECTAL | Status: DC | PRN

## 2020-07-20 MED ORDER — TOLVAPTAN 15 MG PO TABS
15.0000 mg | ORAL_TABLET | Freq: Once | ORAL | Status: AC
  Administered 2020-07-20: 15 mg via ORAL
  Filled 2020-07-20: qty 1

## 2020-07-20 NOTE — Progress Notes (Signed)
   Providing Compassionate, Quality Care - Together  NEUROSURGERY PROGRESS NOTE   S: No issues overnight. Some neck pain, still weakness that is generalized in all 4 extrem  O: EXAM:  BP 109/65 (BP Location: Right Arm)   Pulse 71   Temp 98.2 F (36.8 C) (Oral)   Resp 18   Ht 5\' 11"  (1.803 m)   Wt 80.6 kg   SpO2 98%   BMI 24.78 kg/m   Awake, alert, oriented  Speech fluent, appropriate  CNs grossly intact  Poor dentition 4/5 BUE/BLE + hoffmans BL  Hyperreflexia, unable to check achilles reflex due to fused ankle Patellar reflexes 3/4 Has spont spastic BLE movements  ASSESSMENT:  60 y.o. male with  1. 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 C4-C6 severe cord compression with cord signal change  PLAN: - planned ACDF C4-6 on Tuesday 8/3 as currently he is stable neurologically and has multiple medical comorbidities under optimization - pain control - fall risk - appreciate medical clearance when optimized - hold anticoagulants please - ok for [facility] for DVT ppx    Thank you for allowing me to participate in this patient's care.  Please do not hesitate to call with questions or concerns.   Elwin Sleight, [facility] Cell: [phone]

## 2020-07-20 NOTE — Progress Notes (Signed)
Patient ID: Brad [name], male   DOB: January 16, 1961, 59 y.o.   MRN: [id number]  PROGRESS NOTE    Gurtaj Ruz  FBP:[id number] DOB: April 08, 1961 DOA: 07/18/2020 PCP: Charlott Rakes, MD   Brief Narrative:  59 year old male with history of chronic systolic heart failure with EF of 35 to 40% on echo on 04/03/2020, hypertension, chronic hyponatremia, alcohol abuse, type 2 diabetes mellitus, history of hepatitis C virus with liver cirrhosis, depression, recent multiple hospitalizations with rib pain/chest pain hyponatremia presented with severe right rib pain along with continued jerking of the right leg and other body parts as well.  In the ED, sodium was 116, he was started on IV fluids.  Nephrology was consulted.  Chest x-ray showed mildly displaced fracture of the anterolateral right fifth rib and nondisplaced anterior right severe right sixth rib fracture.  Neurology was consulted for?  Myoclonic jerking.  EEG did not show any seizure.  MRI of the cervical spine showed severe spinal stenosis at C4-C5 C5-C6 levels with moderate cord mass-effect.  Neurosurgery was consulted who recommended that patient be transferred to [place]:   Acute on chronic hyponatremia -Presented with a sodium level of 116.  Recent admission with similar hyponatremia with history of chronic hyponatremia and upon discharge, sodium level was 129 after a fluid restriction and IV Lasix.  He had also received a dose of tolvaptan at one point -Because of fluid overload -Nephrology/cardiology following.  Last sodium level was 121 last evening.  Sodium level pending this morning.  Received some IV fluids in the ED.  Continue fluid restriction. -Received a dose of tolvaptan on 07/19/2020.  Currently on IV Lasix as per cardiology.  Severe cervical stenosis at C4-C5 and C5-C6 levels with moderate cord mass-effect Abnormal jerking movements of extremities -MRI of the cervical spine with findings as above.   Neurosurgery following.  Might need surgical intervention once medically optimized.  Follow further neurosurgery recommendations. -Still having some jerky movements of right leg.  Neurology evaluation appreciated: Neurology has signed off.  EEG did not show any seizure activity.  Right rib pain secondary to right rib fracture from a fall more than a month ago -Chest x-ray showed Mildly displaced fracture of the anterolateral right rib fifth rib perhaps marginally more displaced than on prior study.Nondisplaced anterior severe right sixth rib fracture.  -Continue lidocaine patch, tramadol and hydrocodone for pain control.  Patient keeps requesting for morphine.  I remember him from recent hospitalization during which he kept asking were something with "M" and would later ask for morphine continuously.  Will hold off on intravenous narcotics for now.  Acute on chronic systolic CHF with EF of 35 to 40% on echo on 04/03/2020 -Strict input and output.  Daily weights.  Fluid restriction.  -Evaluation by our failure team appreciated.  Currently on IV Lasix along with isosorbide- hydralazine.  Essential hypertension -Blood pressure intermittently elevated.  Antihypertensives plan as above.  Paroxysmal A. Fib -Continue amiodarone.  Aspirin discontinued by neurosurgery for possible need for surgical intervention.  Currently rate controlled  Alcohol abuse -Monitor signs of withdrawal.  Continue CIWA protocol.  Social worker consult.  Reports that he has not been drinking much recently  Chronic HCV with liver cirrhosis -LFTs stable on presentation.  Outpatient follow-up.  No signs of decompensation at this point  Mild leukopenia -Probably from cirrhosis.  Monitor  Anemia of chronic disease -Probably from cirrhosis.  Hemoglobin stable; pending for today.  Monitor  Generalized deconditioning -will need  PT eval once cleared by neurosurgery    DVT prophylaxis: SCDs.  Lovenox on hold in case patient  needs surgical intervention Code Status: Full Family Communication: Spoke to patient at bedside Disposition Plan: Status is: Inpatient  Remains inpatient appropriate because:Inpatient level of care appropriate due to severity of illness   Dispo: The patient is from: Home              Anticipated d/c is to: Home              Anticipated d/c date is: > 3 days              Patient currently is not medically stable to d/c.   Consultants: Neurology/nephrology/neurosurgery  Procedures: EEG on 07/18/2020: No seizures  Antimicrobials: None   Subjective: Patient seen and examined at bedside.  Poor historian.  Denies worsening shortness of breath or chest pain.  Still complains of pain all over and rib/neck pain.   Objective: Vitals:   07/19/20 1100 07/19/20 1702 07/19/20 2116 07/20/20 0433  BP: (!) 122/88 117/75 118/81 109/65  Pulse: 60 68 70 65  Resp: 18 20 19 16   Temp: 98.2 F (36.8 C) 98.4 F (36.9 C) 98.1 F (36.7 C) 98.4 F (36.9 C)  TempSrc: Oral Oral Oral Oral  SpO2: 100% 100% 97% 98%  Weight:    80.6 kg  Height:        Intake/Output Summary (Last 24 hours) at 07/20/2020 0736 Last data filed at 07/19/2020 2221 Gross per 24 hour  Intake 480 ml  Output 300 ml  Net 180 ml   Filed Weights   07/19/20 0030 07/19/20 0551 07/20/20 0433  Weight: 81.6 kg 81.6 kg 80.6 kg    Examination:  General exam: No acute distress.  Poor historian.  Chronically ill looking and looks older than stated age Respiratory system: Bilateral decreased breath sounds at bases with basilar crackles.  Cardiovascular system: Rate controlled, S1-S2 heard Gastrointestinal system: Abdomen is nondistended, soft and nontender.  Bowel sounds are heard  extremities: Bilateral lower extremity edema present.  No clubbing Central nervous system: Awake and alert.  No focal neurological deficits. Moving extremities.  Extremely poor historian.  Has intermittent right lower extremity jerking movement Skin: No  ecchymosis/obvious rashes Psychiatry: Flat affect.  Does not participate in conversation much.    Data Reviewed: I have personally reviewed following labs and imaging studies  CBC: Recent Labs  Lab 07/18/20 1000 07/18/20 1803 07/19/20 1056  WBC 3.2* 3.7* 3.3*  HGB 11.6* 11.8* 11.0*  HCT 33.3* 33.9* 32.1*  MCV 84.9 85.6 84.9  PLT 239 221 353   Basic Metabolic Panel: Recent Labs  Lab 07/18/20 1000 07/18/20 1803 07/19/20 1056 07/19/20 2048  NA 116* 121* 119* 121*  K 4.3 4.4 4.5  --   CL 82* 88* 89*  --   CO2 21* 21* 23  --   GLUCOSE 146* 142* 109*  --   BUN 15 16 18   --   CREATININE 1.00 0.98 1.31*  --   CALCIUM 8.1* 8.1* 8.2*  --   MG  --   --  1.8  --    GFR: Estimated Creatinine Clearance: 63.9 mL/min (A) (by C-G formula based on SCr of 1.31 mg/dL (H)). Liver Function Tests: Recent Labs  Lab 07/18/20 1803 07/19/20 1056  AST 27 25  ALT 20 17  ALKPHOS 50 48  BILITOT 0.8 1.0  PROT 6.0* 5.7*  ALBUMIN 2.7* 2.5*   No results for input(s): LIPASE,  AMYLASE in the last 168 hours. Recent Labs  Lab 07/18/20 1415  AMMONIA 13   Coagulation Profile: No results for input(s): INR, PROTIME in the last 168 hours. Cardiac Enzymes: No results for input(s): CKTOTAL, CKMB, CKMBINDEX, TROPONINI in the last 168 hours. BNP (last 3 results) No results for input(s): PROBNP in the last 8760 hours. HbA1C: No results for input(s): HGBA1C in the last 72 hours. CBG: Recent Labs  Lab 07/18/20 1759 07/19/20 0751 07/19/20 1109 07/19/20 1700 07/19/20 2115  GLUCAP 134* 178* 111* 247* 130*   Lipid Profile: No results for input(s): CHOL, HDL, LDLCALC, TRIG, CHOLHDL, LDLDIRECT in the last 72 hours. Thyroid Function Tests: Recent Labs    07/18/20 1803  TSH 4.249   Anemia Panel: Recent Labs    07/18/20 1803  VITAMINB12 160*  FOLATE 17.1   Sepsis Labs: No results for input(s): PROCALCITON, LATICACIDVEN in the last 168 hours.  Recent Results (from the past 240 hour(s))   SARS Coronavirus 2 by RT PCR (hospital order, performed in [facility] hospital lab) Nasopharyngeal Urine, Clean Catch     Status: None   Collection Time: 07/18/20 12:23 PM   Specimen: Urine, Clean Catch; Nasopharyngeal  Result Value Ref Range Status   SARS Coronavirus 2 NEGATIVE NEGATIVE Final    Comment: (NOTE) SARS-CoV-2 target nucleic acids are NOT DETECTED.  The SARS-CoV-2 RNA is generally detectable in upper and lower respiratory specimens during the acute phase of infection. The lowest concentration of SARS-CoV-2 viral copies this assay can detect is 250 copies / mL. A negative result does not preclude SARS-CoV-2 infection and should not be used as the sole basis for treatment or other patient management decisions.  A negative result may occur with improper specimen collection / handling, submission of specimen other than nasopharyngeal swab, presence of viral mutation(s) within the areas targeted by this assay, and inadequate number of viral copies (<250 copies / mL). A negative result must be combined with clinical observations, patient history, and epidemiological information.  Fact Sheet for Patients:   StrictlyIdeas.no  Fact Sheet for Healthcare Providers: BankingDealers.co.za  This test is not yet approved or  cleared by the [place] FDA and has been authorized for detection and/or diagnosis of SARS-CoV-2 by FDA under an Emergency Use Authorization (EUA).  This EUA will remain in effect (meaning this test can be used) for the duration of the COVID-19 declaration under Section 564(b)(1) of the Act, 21 U.S.C. section 360bbb-3(b)(1), unless the authorization is terminated or revoked sooner.  Performed at [facility], [place] [address] [place], [place] [address]          Radiology Studies: EEG  Result Date: 07/18/2020 Lora Havens, MD     07/18/2020  6:08 PM Patient Name: Mahir Prabhakar  MRN: [id number] Epilepsy Attending: Lora Havens Referring Physician/Provider: Dr. Davonna Belling Date: 07/18/2020 Duration: 24.59 mins Patient history: 59 year old male who presented to the ED with complaints of right-sided rib pain.  In the ED was noted to have sodium of 116.  While in the ED was also noted to have rhythmic right-sided jerking occurring approximately every 15 seconds.  EEG to evaluate for seizures. Level of alertness: Awake, asleep AEDs during EEG study: None Technical aspects: This EEG study was done with scalp electrodes positioned according to the 10-20 International system of electrode placement. Electrical activity was acquired at a sampling rate of 500Hz  and reviewed with a high frequency filter of 70Hz  and a low frequency filter of 1Hz . EEG  data were recorded continuously and digitally stored. Description: The posterior dominant rhythm consists of 8.5 Hz activity of moderate voltage (25-35 uV) seen predominantly in posterior head regions, symmetric and reactive to eye opening and eye closing. Sleep was characterized by vertex waves, sleep spindles (12 to 14 Hz), maximal frontocentral region. Hyperventilation and photic stimulation were not performed.   Multiple episodes of intermittent brief right leg contraction/twitching were recorded during the EEG.  Concomitant EEG before, during and after the episode did not show any EEG change to suggest seizure. IMPRESSION: This study is within normal limits. No seizures or epileptiform discharges were seen throughout the recording. Multiple episodes of brief right lower extremity twitching were recorded without concomitant EEG change and were most likely nonepileptic. Lora Havens   DG Ribs Bilateral W/Chest  Result Date: 07/18/2020 CLINICAL DATA:  Pain following recent fall EXAM: BILATERAL RIBS AND CHEST - 4+ VIEW COMPARISON:  June 30, 2020 FINDINGS: Frontal chest as well as oblique and cone-down rib images bilaterally obtained. Lungs  are clear. Heart is upper normal in size with pulmonary vascularity normal. No adenopathy. There is a displaced fracture of the anterolateral right fifth rib. There is a nondisplaced fracture of the anterior right sixth rib. No other fractures are evident. There is no appreciable pneumothorax or pleural effusion on the right. IMPRESSION: Mildly displaced fracture of the anterolateral right fifth rib, perhaps marginally more displaced than on prior study. Nondisplaced fracture anterior right sixth rib. No other fractures are evident. No pneumothorax or pleural effusion. Lungs clear. Heart upper normal in size. Electronically Signed   By: Lowella Grip III M.D.   On: 07/18/2020 11:24   MR CERVICAL SPINE WO CONTRAST  Result Date: 07/18/2020 CLINICAL DATA:  59 year old male status post fall 2 weeks ago with acute or progressive myelopathy. EXAM: MRI CERVICAL SPINE WITHOUT CONTRAST TECHNIQUE: Multiplanar, multisequence MR imaging of the cervical spine was performed. No intravenous contrast was administered. COMPARISON:  Cervical spine CT 05/03/2020. FINDINGS: Study is mildly degraded by motion artifact despite repeated imaging attempts, and no diagnostic axial GRE images could be obtained. Alignment: Straightening of cervical lordosis is stable from the May CT. No significant spondylolisthesis. Vertebrae: No marrow edema or evidence of acute osseous abnormality. Visualized bone marrow signal is within normal limits. Cord: Limited cervical spinal cord detail due to motion artifact, but suggestion of cord T2 heterogeneity at both C4-C5 and C5-C6 where significant degenerative spinal stenosis and cord compression are noted (see below) suspicious for myelomalacia. Above and below those levels cord signal and morphology appears within normal limits. Posterior Fossa, vertebral arteries, paraspinal tissues: Cervicomedullary junction appears within normal limits. There is some inferior left cerebellar encephalomalacia  suspected (series 6, image 14). Grossly negative visible neck soft tissues, lung apices. Disc levels: C2-C3: Disc bulging and endplate spurring is mild. No spinal stenosis. There is mild C3 foraminal stenosis. C3-C4: Mild disc bulging and endplate spurring. Right neural foramen most affected. Moderate to severe right C4 foraminal stenosis. No spinal stenosis. C4-C5: Mild disc space loss but bulky broad-based posterior disc osteophyte complex. This is mildly lobulated and results in moderate to severe spinal stenosis with at least moderate cord mass effect (series 7, image 17). Superimposed severe bilateral C5 foraminal stenosis. C5-C6: Disc space loss with similar bulky disc osteophyte complex, broad-based mildly lobulated posterior component. Moderate to severe spinal stenosis and at least moderate cord mass effect (series 7, image 21). Severe bilateral C6 foraminal stenosis. C6-C7: Mild circumferential disc bulge and endplate spurring.  Mild to moderate posterior element hypertrophy. Borderline to mild spinal stenosis. No cord mass effect. Mild left and moderate right C7 foraminal stenosis. C7-T1:  Mild facet hypertrophy.  No significant stenosis. Negative visible upper thoracic levels. IMPRESSION: 1. Bulky disc osteophyte degeneration at both C4-C5 and C5-C6 with moderate to severe spinal stenosis at both levels, at least moderate cord mass effect. Suspicion of abnormal cord signal (myelomalacia versus cord edema from recent fall) although suboptimal cord detail due to motion artifacts. 2. No other significant spinal stenosis despite additional degeneration. There is moderate or severe degenerative neural foraminal stenosis at the right C4, bilateral C5 and C6, and right C7 nerve levels. Salient findings on this exam were reviewed in person with Dr. Roland Rack on 07/18/2020 at 18:10 . Electronically Signed   By: Genevie Ann M.D.   On: 07/18/2020 18:15        Scheduled Meds: . amiodarone  200 mg Oral  Daily  . atorvastatin  20 mg Oral q1800  . cyanocobalamin  1,000 mcg Intramuscular Daily  . folic acid  1 mg Oral Daily  . furosemide  60 mg Intravenous BID  . gabapentin  600 mg Oral QHS  . insulin aspart  0-9 Units Subcutaneous TID WC  . isosorbide-hydrALAZINE  0.5 tablet Oral TID  . lidocaine  1 patch Transdermal Q24H  . multivitamin with minerals  1 tablet Oral Daily  . thiamine  100 mg Oral Daily   Or  . thiamine  100 mg Intravenous Daily   Continuous Infusions:        Aline August, MD [facility] 07/20/2020, 7:36 AM

## 2020-07-20 NOTE — Progress Notes (Signed)
CSW spoke with patient at bedside. CSW offered Outpatient Substance Use Treatment Services Resources. Patient declined.  CSW will continue to follow for any other patient needs. 

## 2020-07-20 NOTE — Progress Notes (Signed)
Admit: 07/18/2020 LOS: 2  30M AoC HFREF NICM, AoC Hyponatremia, cervical stenosis needing decompression. CKD3  Subjective:  . SNa 124 . Diuresing BID lasix . ? 0.3L UOP yesterday? . AHF ordered tolvaptan again today  07/29 0701 - 07/30 0700 In: 480 [P.O.:480] Out: 300 [Urine:300]  Filed Weights   07/19/20 0030 07/19/20 0551 07/20/20 0433  Weight: 81.6 kg 81.6 kg 80.6 kg    Scheduled Meds: . amiodarone  200 mg Oral Daily  . atorvastatin  20 mg Oral q1800  . cyanocobalamin  1,000 mcg Intramuscular Daily  . folic acid  1 mg Oral Daily  . furosemide  60 mg Intravenous BID  . gabapentin  600 mg Oral QHS  . heparin  5,000 Units Subcutaneous Q8H  . insulin aspart  0-9 Units Subcutaneous TID WC  . isosorbide-hydrALAZINE  0.5 tablet Oral TID  . lidocaine  1 patch Transdermal Q24H  . multivitamin with minerals  1 tablet Oral Daily  . thiamine  100 mg Oral Daily   Or  . thiamine  100 mg Intravenous Daily   Continuous Infusions: PRN Meds:.acetaminophen **OR** acetaminophen, HYDROcodone-acetaminophen, LORazepam **OR** LORazepam, methocarbamol, ondansetron **OR** ondansetron (ZOFRAN) IV, promethazine, senna-docusate, traMADol  Current Labs: reviewed  7/28 UNa 11, UOsm 122 7/28 SOsm 257  Physical Exam:  Blood pressure 109/65, pulse 71, temperature 98.2 F (36.8 C), temperature source Oral, resp. rate 18, height 5\' 11"  (1.803 m), weight 80.6 kg, SpO2 98 %. NAD 2+ LEE RRR  CTAB S/nt/nd Nonfocal, CN2-12 intact  A 1. Hypervolemic Hyponatremia 2/2 #2 2. AoC HFrEF, diureing, NICM 3. Cervical st enosis for deompression next week 4. CKD3 5. Hx/o ETOH abuse 6. AFib, chronic.   P . Cont diuretics . Cont 1.2Lfluid restrict, but might need to less if sig aquaresis with vaptan . Trend SNa BID at least . Medication Issues; o Preferred narcotic agents for pain control are hydromorphone, fentanyl, and methadone. Morphine should not be used.  o Baclofen should be avoided o Avoid  oral sodium phosphate and magnesium citrate based laxatives / bowel preps    Pearson Grippe MD 07/20/2020, 12:08 PM  Recent Labs  Lab 07/18/20 1803 07/18/20 1803 07/19/20 1056 07/19/20 2048 07/20/20 0817  NA 121*   < > 119* 121* 124*  K 4.4  --  4.5  --  4.5  CL 88*  --  89*  --  92*  CO2 21*  --  23  --  24  GLUCOSE 142*  --  109*  --  141*  BUN 16  --  18  --  20  CREATININE 0.98  --  1.31*  --  1.50*  CALCIUM 8.1*  --  8.2*  --  8.3*   < > = values in this interval not displayed.   Recent Labs  Lab 07/18/20 1000 07/18/20 1803 07/19/20 1056  WBC 3.2* 3.7* 3.3*  HGB 11.6* 11.8* 11.0*  HCT 33.3* 33.9* 32.1*  MCV 84.9 85.6 84.9  PLT 239 221 179

## 2020-07-20 NOTE — Progress Notes (Addendum)
Patient ID: Brad [name], male   DOB: 1961/03/10, 59 y.o.   MRN: [id number]     Advanced Heart Failure Rounding Note  PCP-Cardiologist: [name], MD   Subjective:    I/Os not measured but weight is down > 2 lbs.  No labs yet this morning, re-ordered stat.    Objective:   Weight Range: 80.6 kg Body mass index is 24.78 kg/m.   Vital Signs:   Temp:  [98.1 F (36.7 C)-98.4 F (36.9 C)] 98.4 F (36.9 C) (07/30 0433) Pulse Rate:  [60-70] 65 (07/30 0433) Resp:  [16-20] 16 (07/30 0433) BP: (109-122)/(65-88) 109/65 (07/30 0433) SpO2:  [97 %-100 %] 98 % (07/30 0433) Weight:  [80.6 kg] 80.6 kg (07/30 0433)    Weight change: Filed Weights   07/19/20 0030 07/19/20 0551 07/20/20 0433  Weight: 81.6 kg 81.6 kg 80.6 kg    Intake/Output:   Intake/Output Summary (Last 24 hours) at 07/20/2020 0758 Last data filed at 07/19/2020 2221 Gross per 24 hour  Intake 480 ml  Output 300 ml  Net 180 ml      Physical Exam    General:  Well appearing. No resp difficulty HEENT: Normal Neck: Supple. JVP 14+. Carotids 2+ bilat; no bruits. No lymphadenopathy or thyromegaly appreciated. Cor: PMI nondisplaced. Irregular rate & rhythm. No rubs, gallops. 2/6 HSM LLSB/apex.  Lungs: Clear Abdomen: Soft, nontender, nondistended. No hepatosplenomegaly. No bruits or masses. Good bowel sounds. Extremities: No cyanosis, clubbing, rash, edema Neuro: Alert & orientedx3, cranial nerves grossly intact. moves all 4 extremities w/o difficulty. Affect pleasant   Telemetry   Atrial fibrillation rate 60s (personally reviewed).   Labs    CBC Recent Labs    07/18/20 1803 07/19/20 1056  WBC 3.7* 3.3*  HGB 11.8* 11.0*  HCT 33.9* 32.1*  MCV 85.6 84.9  PLT 221 532   Basic Metabolic Panel Recent Labs    07/18/20 1803 07/18/20 1803 07/19/20 1056 07/19/20 2048  NA 121*   < > 119* 121*  K 4.4  --  4.5  --   CL 88*  --  89*  --   CO2 21*  --  23  --   GLUCOSE 142*  --  109*  --   BUN 16  --   18  --   CREATININE 0.98  --  1.31*  --   CALCIUM 8.1*  --  8.2*  --   MG  --   --  1.8  --    < > = values in this interval not displayed.   Liver Function Tests Recent Labs    07/18/20 1803 07/19/20 1056  AST 27 25  ALT 20 17  ALKPHOS 50 48  BILITOT 0.8 1.0  PROT 6.0* 5.7*  ALBUMIN 2.7* 2.5*   No results for input(s): LIPASE, AMYLASE in the last 72 hours. Cardiac Enzymes No results for input(s): CKTOTAL, CKMB, CKMBINDEX, TROPONINI in the last 72 hours.  BNP: BNP (last 3 results) Recent Labs    05/29/20 0430 06/21/20 0626 06/30/20 0657  BNP 678.3* 584.3* 656.4*    ProBNP (last 3 results) No results for input(s): PROBNP in the last 8760 hours.   D-Dimer No results for input(s): DDIMER in the last 72 hours. Hemoglobin A1C No results for input(s): HGBA1C in the last 72 hours. Fasting Lipid Panel No results for input(s): CHOL, HDL, LDLCALC, TRIG, CHOLHDL, LDLDIRECT in the last 72 hours. Thyroid Function Tests Recent Labs    07/18/20 1803  TSH 4.249  Other results:   Imaging     No results found.   Medications:     Scheduled Medications: . amiodarone  200 mg Oral Daily  . atorvastatin  20 mg Oral q1800  . cyanocobalamin  1,000 mcg Intramuscular Daily  . folic acid  1 mg Oral Daily  . furosemide  60 mg Intravenous BID  . gabapentin  600 mg Oral QHS  . insulin aspart  0-9 Units Subcutaneous TID WC  . isosorbide-hydrALAZINE  0.5 tablet Oral TID  . lidocaine  1 patch Transdermal Q24H  . multivitamin with minerals  1 tablet Oral Daily  . thiamine  100 mg Oral Daily   Or  . thiamine  100 mg Intravenous Daily     Infusions:   PRN Medications:  acetaminophen **OR** acetaminophen, HYDROcodone-acetaminophen, LORazepam **OR** LORazepam, methocarbamol, ondansetron **OR** ondansetron (ZOFRAN) IV, promethazine, senna-docusate, traMADol   Assessment/Plan   1. Pre-operative evaluation: Patient had cath in 6/21 with nonobstructive coronary  disease (and elevated filling pressures).  He has atypical, non-cardiac chest pain (probably from prior rib fractures).  Currently, he is volume overloaded on exam and also significantly hyponatremic (119 last check, still pending this morning).  He needs cervical fusion for c-spine stenosis and cord compression.  I think that surgery will be reasonable once his sodium level is controlled (would like to see it up to at least 125 before he goes to the OR, as fluid shifts in the OR may lead the sodium to fall a bit).  2. Hyponatremia:Recurrent.Sodium116 at admission, 119 most recently yesterday.  Still pending labs this morning.He is volume overloaded on exam with JVP to earlobe while sitting up, suspect ongoing excessive po fluid intake at home. This appears to be hypervolemic hyponatremia (he is NOT hypovolemic).  - With volume overload, would avoid salt tablets and further IV fluid.  - He got tolvaptan 15 mg x 1 yesterday, need labs this morning to see if he needs further tolvaptan.   - Diurese gently with IV Lasix as below.  - Follow Na carefully, avoid correction by more than 6-7 mmol/L per day (labs re-ordered stat this morning, unfortunately they have not yet been drawn).  2. Acute on chronic systolic CHF: Echo (9/51) EF 35%, moderately reduced RV fxn, moderate-severe MR, moderate TR. Nonischemic cardiomyopathy with nonobstructive CAD on 6/21 cath, probably due to substance abuse (ETOH, prior cocaine). He is volume overloaded on exam with JVP to earlobes. Weight down > 2 lbs but I/Os not measured.  - We will try to measure I/Os.  -Continue Lasix 60 mg IV bid pending labs.  - Possibly will redose tolvaptan depending on morning labs.   -He has been off beta blocker with bradycardia. -Continue Bidil 0.5 tab tid, should have BP room. 3. Atrial fibrillation: Chronic. He has been off anticoagulation due toheavyETOH abuse and frequent falls.  -Off Coreg with low HR.  - Without  anticoagulation, DCCV is not an option. - If he quits drinking and improves stability, start Eliquis. 4. ETOH abuse: Ideally needs rehab program.Still drinking some at home, says he has cut back a lot.  5. PVCs: Amiodarone begun at last appointment due to frequent PVCs.   - Continue amiodarone.  - Follow on telemetry.  6. Syncope: Has had in the past, has fractured ribs.  Hard to know if this has been due to ETOH intoxication or arrhythmias.  He has been deemed not a candidate for ICD due to ETOH abuse.    Length of Stay: 2  Loralie Champagne, MD  07/20/2020, 7:58 AM  Advanced Heart Failure Team Pager [phone] (M-F; 7a - 4p)  Please contact [facility] for night-coverage after hours (4p -7a ) and weekends on [email or web address]  Sodium up to 124, 5 points over the last day.  With ongoing volume overload, will give 1 more dose of tolvaptan 15 mg today .  Loralie Champagne 07/20/2020 10:16 AM

## 2020-07-21 LAB — COMPREHENSIVE METABOLIC PANEL
ALT: 17 U/L (ref 0–44)
AST: 24 U/L (ref 15–41)
Albumin: 2.4 g/dL — ABNORMAL LOW (ref 3.5–5.0)
Alkaline Phosphatase: 55 U/L (ref 38–126)
Anion gap: 8 (ref 5–15)
BUN: 26 mg/dL — ABNORMAL HIGH (ref 6–20)
CO2: 26 mmol/L (ref 22–32)
Calcium: 8.2 mg/dL — ABNORMAL LOW (ref 8.9–10.3)
Chloride: 94 mmol/L — ABNORMAL LOW (ref 98–111)
Creatinine, Ser: 1.89 mg/dL — ABNORMAL HIGH (ref 0.61–1.24)
GFR calc Af Amer: 44 mL/min — ABNORMAL LOW (ref >60)
GFR calc non Af Amer: 38 mL/min — ABNORMAL LOW (ref >60)
Glucose, Bld: 155 mg/dL — ABNORMAL HIGH (ref 70–99)
Potassium: 4.2 mmol/L (ref 3.5–5.1)
Sodium: 128 mmol/L — ABNORMAL LOW (ref 135–145)
Total Bilirubin: 0.5 mg/dL (ref 0.3–1.2)
Total Protein: 5.6 g/dL — ABNORMAL LOW (ref 6.5–8.1)

## 2020-07-21 LAB — GLUCOSE, CAPILLARY
Glucose-Capillary: 157 mg/dL — ABNORMAL HIGH (ref 70–99)
Glucose-Capillary: 152 mg/dL — ABNORMAL HIGH (ref 70–99)
Glucose-Capillary: 150 mg/dL — ABNORMAL HIGH (ref 70–99)
Glucose-Capillary: 167 mg/dL — ABNORMAL HIGH (ref 70–99)

## 2020-07-21 LAB — MAGNESIUM: Magnesium: 1.8 mg/dL (ref 1.7–2.4)

## 2020-07-21 MED ORDER — LORATADINE 10 MG PO TABS
10.0000 mg | ORAL_TABLET | Freq: Every day | ORAL | Status: DC | PRN
  Administered 2020-07-21: 10 mg via ORAL
  Filled 2020-07-21: qty 1

## 2020-07-21 MED ORDER — ISOSORB DINITRATE-HYDRALAZINE 20-37.5 MG PO TABS
1.0000 | ORAL_TABLET | Freq: Three times a day (TID) | ORAL | Status: DC
  Administered 2020-07-21 – 2020-07-27 (×17): 1 via ORAL
  Filled 2020-07-21 (×19): qty 1

## 2020-07-21 NOTE — Progress Notes (Signed)
Admit: 07/18/2020 LOS: 3  51M AoC HFREF NICM, AoC Hyponatremia, cervical stenosis needing decompression. CKD3  Subjective:  . SNa 128 . SCr 1.9, up . 3.7 L UOP, rec lasix + tolvaptan yesterday  07/30 0701 - 07/31 0700 In: 960 [P.O.:960] Out: 3700 [Urine:3700]  Filed Weights   07/19/20 0551 07/20/20 0433 07/21/20 0534  Weight: 81.6 kg 80.6 kg 80.5 kg    Scheduled Meds: . amiodarone  200 mg Oral Daily  . atorvastatin  20 mg Oral q1800  . cyanocobalamin  1,000 mcg Intramuscular Daily  . folic acid  1 mg Oral Daily  . furosemide  60 mg Intravenous BID  . gabapentin  600 mg Oral QHS  . heparin  5,000 Units Subcutaneous Q8H  . insulin aspart  0-9 Units Subcutaneous TID WC  . isosorbide-hydrALAZINE  1 tablet Oral TID  . lidocaine  1 patch Transdermal Q24H  . multivitamin with minerals  1 tablet Oral Daily  . senna-docusate  1 tablet Oral BID  . thiamine  100 mg Oral Daily   Or  . thiamine  100 mg Intravenous Daily   Continuous Infusions: PRN Meds:.acetaminophen **OR** acetaminophen, bisacodyl, HYDROcodone-acetaminophen, LORazepam **OR** LORazepam, methocarbamol, ondansetron **OR** ondansetron (ZOFRAN) IV, polyethylene glycol, promethazine, senna-docusate, traMADol  Current Labs: reviewed  7/28 UNa 11, UOsm 122 7/28 SOsm 257  Physical Exam:  Blood pressure (!) 131/91, pulse 65, temperature 98 F (36.7 C), temperature source Oral, resp. rate 18, height 5\' 11"  (1.803 m), weight 80.5 kg, SpO2 100 %. NAD trace LEE RRR  CTAB S/nt/nd Nonfocal, CN2-12 intact  A 1. Hypervolemic Hyponatremia 2/2 #2, improved 2. AoC HFrEF, diureing, NICM 3. Cervical stenosis for deompression next week 4. CKD3, mild AKI likely related to hemodynamics/diuretics/tolvaptan 5. Hx/o ETOH abuse 6. AFib, chronic.   P . Hold diuretic this afternoon . Would not dose any more vaptan . Continue 1.2Lfluid restrict . Medication Issues; o Preferred narcotic agents for pain control are hydromorphone,  fentanyl, and methadone. Morphine should not be used.  o Baclofen should be avoided o Avoid oral sodium phosphate and magnesium citrate based laxatives / bowel preps    Pearson Grippe MD 07/21/2020, 11:10 AM  Recent Labs  Lab 07/19/20 1056 07/19/20 2048 07/20/20 0817 07/20/20 1814 07/21/20 0240  NA 119*   < > 124* 125* 128*  K 4.5  --  4.5  --  4.2  CL 89*  --  92*  --  94*  CO2 23  --  24  --  26  GLUCOSE 109*  --  141*  --  155*  BUN 18  --  20  --  26*  CREATININE 1.31*  --  1.50*  --  1.89*  CALCIUM 8.2*  --  8.3*  --  8.2*   < > = values in this interval not displayed.   Recent Labs  Lab 07/18/20 1803 07/19/20 1056 07/20/20 1201  WBC 3.7* 3.3* 3.3*  HGB 11.8* 11.0* 10.7*  HCT 33.9* 32.1* 31.8*  MCV 85.6 84.9 86.2  PLT 221 179 177

## 2020-07-21 NOTE — Progress Notes (Signed)
Overall stable.  No new issues or problems.  Sodium improved.  Cardiac status felt to be stable for surgery.  Plan for anterior cervical decompression and fusion with Dr. Reatha Armour on Tuesday.

## 2020-07-21 NOTE — Progress Notes (Signed)
Patient ID: Brad [name], male   DOB: 1961/05/18, 59 y.o.   MRN: [id number]     Advanced Heart Failure Rounding Note  PCP-Cardiologist: [name], MD   Subjective:    I/Os negative net 2740.  BP stable.  Creatinine up to 1.89.  Sodium up to 128, had tolvaptan again yesterday.    Objective:   Weight Range: 80.5 kg Body mass index is 24.75 kg/m.   Vital Signs:   Temp:  [98 F (36.7 C)-98.3 F (36.8 C)] 98 F (36.7 C) (07/31 0534) Pulse Rate:  [60-74] 65 (07/31 0534) Resp:  [18] 18 (07/31 0534) BP: (110-139)/(66-91) 131/91 (07/31 0807) SpO2:  [100 %] 100 % (07/31 0534) Weight:  [80.5 kg] 80.5 kg (07/31 0534) Last BM Date: 07/18/20  Weight change: Filed Weights   07/19/20 0551 07/20/20 0433 07/21/20 0534  Weight: 81.6 kg 80.6 kg 80.5 kg    Intake/Output:   Intake/Output Summary (Last 24 hours) at 07/21/2020 1008 Last data filed at 07/21/2020 0653 Gross per 24 hour  Intake 600 ml  Output 3150 ml  Net -2550 ml      Physical Exam    General: NAD Neck: JVP 10 cm, no thyromegaly or thyroid nodule.  Lungs: Clear to auscultation bilaterally with normal respiratory effort. CV: Lateral PMI.  Heart irregular S1/S2, no S3/S4, no murmur.  No peripheral edema.   Abdomen: Soft, nontender, no hepatosplenomegaly, no distention.  Skin: Intact without lesions or rashes.  Neurologic: Alert and oriented x 3.  Psych: Normal affect. Extremities: No clubbing or cyanosis.  HEENT: Normal.     Telemetry   Atrial fibrillation rate 60s (personally reviewed).   Labs    CBC Recent Labs    07/19/20 1056 07/20/20 1201  WBC 3.3* 3.3*  HGB 11.0* 10.7*  HCT 32.1* 31.8*  MCV 84.9 86.2  PLT 179 664   Basic Metabolic Panel Recent Labs    07/19/20 1056 07/19/20 2048 07/20/20 0817 07/20/20 0817 07/20/20 1814 07/21/20 0240  NA 119*   < > 124*   < > 125* 128*  K 4.5  --  4.5  --   --  4.2  CL 89*  --  92*  --   --  94*  CO2 23  --  24  --   --  26  GLUCOSE 109*  --   141*  --   --  155*  BUN 18  --  20  --   --  26*  CREATININE 1.31*  --  1.50*  --   --  1.89*  CALCIUM 8.2*  --  8.3*  --   --  8.2*  MG 1.8  --   --   --   --  1.8   < > = values in this interval not displayed.   Liver Function Tests Recent Labs    07/20/20 0817 07/21/20 0240  AST 25 24  ALT 17 17  ALKPHOS 52 55  BILITOT 0.6 0.5  PROT 5.8* 5.6*  ALBUMIN 2.5* 2.4*   No results for input(s): LIPASE, AMYLASE in the last 72 hours. Cardiac Enzymes No results for input(s): CKTOTAL, CKMB, CKMBINDEX, TROPONINI in the last 72 hours.  BNP: BNP (last 3 results) Recent Labs    05/29/20 0430 06/21/20 0626 06/30/20 0657  BNP 678.3* 584.3* 656.4*    ProBNP (last 3 results) No results for input(s): PROBNP in the last 8760 hours.   D-Dimer No results for input(s): DDIMER in the last 72 hours. Hemoglobin  A1C No results for input(s): HGBA1C in the last 72 hours. Fasting Lipid Panel No results for input(s): CHOL, HDL, LDLCALC, TRIG, CHOLHDL, LDLDIRECT in the last 72 hours. Thyroid Function Tests Recent Labs    07/18/20 1803  TSH 4.249    Other results:   Imaging    No results found.   Medications:     Scheduled Medications: . amiodarone  200 mg Oral Daily  . atorvastatin  20 mg Oral q1800  . cyanocobalamin  1,000 mcg Intramuscular Daily  . folic acid  1 mg Oral Daily  . furosemide  60 mg Intravenous BID  . gabapentin  600 mg Oral QHS  . heparin  5,000 Units Subcutaneous Q8H  . insulin aspart  0-9 Units Subcutaneous TID WC  . isosorbide-hydrALAZINE  1 tablet Oral TID  . lidocaine  1 patch Transdermal Q24H  . multivitamin with minerals  1 tablet Oral Daily  . senna-docusate  1 tablet Oral BID  . thiamine  100 mg Oral Daily   Or  . thiamine  100 mg Intravenous Daily    Infusions:   PRN Medications: acetaminophen **OR** acetaminophen, bisacodyl, HYDROcodone-acetaminophen, LORazepam **OR** LORazepam, methocarbamol, ondansetron **OR** ondansetron (ZOFRAN)  IV, polyethylene glycol, promethazine, senna-docusate, traMADol   Assessment/Plan   1. Pre-operative evaluation: Patient had cath in 6/21 with nonobstructive coronary disease (and elevated filling pressures).  He has atypical, non-cardiac chest pain (probably from prior rib fractures).  Currently, he is volume overloaded on exam and also significantly hyponatremic (119 last check, still pending this morning).  He needs cervical fusion for c-spine stenosis and cord compression.  I think that surgery is reasonable now that sodium is improved (up to 128).  Timing per neurosurgery, looks like planned for Tuesday.  2. Hyponatremia:Recurrent.[id number] at admission. Hypervolemic hyponatremia in setting of excess fluid intake at home.  He has had tolvaptan x 2 now with Na up to 128 today.  - With volume overload, would avoid salt tablets and further IV fluid.  - No tolvaptan today, continue po fluid restriction.    - With creatinine up, will hold afternoon Lasix (got am dose).  2. Acute on chronic systolic CHF: Echo (7/61) EF 35%, moderately reduced RV fxn, moderate-severe MR, moderate TR. Nonischemic cardiomyopathy with nonobstructive CAD on 6/21 cath, probably due to substance abuse (ETOH, prior cocaine). He diuresed well yesterday but is still volume overloaded on exam.  Creatinine up to 1.89 today.  He had a dose of IV Lasix this morning.  - Hold afternoon Lasix with rise in creatinine, reassess tomorrow for diuretic regimen.    -He has been off beta blocker with bradycardia. -can increase Bidil to 1 tab tid.  3. Atrial fibrillation: Chronic. He has been off anticoagulation due toheavyETOH abuse and frequent falls.  -Off Coreg with low HR.  - Without anticoagulation, DCCV is not an option. - If he quits drinking and improves stability, start Eliquis. 4. ETOH abuse: Ideally needs rehab program.Still drinking some at home, says he has cut back a lot.  5. PVCs: Amiodarone begun at last  appointment due to frequent PVCs.   - Continue amiodarone.  - Follow on telemetry.  6. Syncope: Has had in the past, has fractured ribs.  Hard to know if this has been due to ETOH intoxication or arrhythmias.  He has been deemed not a candidate for ICD due to ETOH abuse.    Length of Stay: 3  Loralie Champagne, MD  07/21/2020, 10:08 AM  Advanced Heart Failure Team Pager  [phone] (M-F; 7a - 4p)  Please contact [facility] for night-coverage after hours (4p -7a ) and weekends on [email or web address]

## 2020-07-21 NOTE — Progress Notes (Signed)
Patient ID: Graciano Batson, male   DOB: Jan 16, 1961, 59 y.o.   MRN: [id number]  PROGRESS NOTE    Shanna Strength  FOY:[id number] DOB: 01/10/1961 DOA: 07/18/2020 PCP: Charlott Rakes, MD   Brief Narrative:  59 year old male with history of chronic systolic heart failure with EF of 35 to 40% on echo on 04/03/2020, hypertension, chronic hyponatremia, alcohol abuse, type 2 diabetes mellitus, history of hepatitis C virus with liver cirrhosis, depression, recent multiple hospitalizations with rib pain/chest pain hyponatremia presented with severe right rib pain along with continued jerking of the right leg and other body parts as well.  In the ED, sodium was 116, he was started on IV fluids.  Nephrology was consulted.  Chest x-ray showed mildly displaced fracture of the anterolateral right fifth rib and nondisplaced anterior right severe right sixth rib fracture.  Neurology was consulted for?  Myoclonic jerking.  EEG did not show any seizure.  MRI of the cervical spine showed severe spinal stenosis at C4-C5 C5-C6 levels with moderate cord mass-effect.  Neurosurgery was consulted who recommended that patient be transferred to [facility]:   Acute on chronic hyponatremia -Presented with a sodium level of 116.  Recent admission with similar hyponatremia with history of chronic hyponatremia and upon discharge, sodium level was 129 after a fluid restriction and IV Lasix.  He had also received a dose of tolvaptan at one point -Because of fluid overload -Nephrology/cardiology following.  Sodium level 128 this morning.  Continue fluid restriction. -Received a dose of tolvaptan on 07/19/2020 and on 07/20/2020.  Currently on IV Lasix as per cardiology.  Severe cervical stenosis at C4-C5 and C5-C6 levels with moderate cord mass-effect Abnormal jerking movements of extremities -MRI of the cervical spine with findings as above.  Neurosurgery following.  For probable surgical intervention on  Tuesday -Still having some jerky movements of right leg.  Neurology evaluation appreciated: Neurology has signed off.  EEG did not show any seizure activity.  Right rib pain secondary to right rib fracture from a fall more than a month ago -Chest x-ray showed Mildly displaced fracture of the anterolateral right rib fifth rib perhaps marginally more displaced than on prior study.Nondisplaced anterior severe right sixth rib fracture.  -Continue lidocaine patch, tramadol and hydrocodone for pain control.  Patient keeps requesting for morphine.  I remember him from recent hospitalization during which he kept asking were something with "M" and would later ask for morphine continuously.  Will hold off on intravenous narcotics for now.  Acute on chronic systolic CHF with EF of 35 to 40% on echo on 04/03/2020 -Strict input and output.  Daily weights.  Fluid restriction.  -Heart failure team following.  Currently on IV Lasix along with isosorbide- hydralazine. -Monitor creatinine.  Creatinine uptrending.  Essential hypertension -Blood pressure intermittently elevated.  Antihypertensives plan as above.  Paroxysmal A. Fib -Continue amiodarone.  Aspirin discontinued by neurosurgery for possible need for surgical intervention.  Currently rate controlled  Alcohol abuse -Monitor signs of withdrawal.  Continue CIWA protocol.  Social worker consult.  Reports that he has not been drinking much recently  Chronic HCV with liver cirrhosis -LFTs stable on presentation.  Outpatient follow-up.  No signs of decompensation at this point  Mild leukopenia -Probably from cirrhosis.  Monitor  Anemia of chronic disease -Probably from cirrhosis.  Hemoglobin stable; pending for today.  Monitor  Generalized deconditioning -will need PT eval once cleared by neurosurgery    DVT prophylaxis: SCDs.  Subcutaneous heparin  code Status: Full Family Communication: Spoke to patient at bedside Disposition Plan: Status is:  Inpatient  Remains inpatient appropriate because:Inpatient level of care appropriate due to severity of illness   Dispo: The patient is from: Home              Anticipated d/c is to: Home              Anticipated d/c date is: > 3 days              Patient currently is not medically stable to d/c.   Consultants: Neurology/nephrology/neurosurgery  Procedures: EEG on 07/18/2020: No seizures  Antimicrobials: None   Subjective: Patient seen and examined at bedside.  Poor historian.  Denies fever, worsening shortness of breath or chest pain.  Still complains of neck pain.  Asking for morphine or methadone instead.  Complains of some itching. Objective: Vitals:   07/20/20 1523 07/20/20 2034 07/21/20 0534 07/21/20 0807  BP: 110/66 116/75 (!) 139/85 (!) 131/91  Pulse: 60 74 65   Resp: 18 18 18    Temp: 98.3 F (36.8 C) 98.2 F (36.8 C) 98 F (36.7 C)   TempSrc: Oral Oral Oral   SpO2: 100% 100% 100%   Weight:   80.5 kg   Height:        Intake/Output Summary (Last 24 hours) at 07/21/2020 0809 Last data filed at 07/21/2020 0653 Gross per 24 hour  Intake 870 ml  Output 3700 ml  Net -2830 ml   Filed Weights   07/19/20 0551 07/20/20 0433 07/21/20 0534  Weight: 81.6 kg 80.6 kg 80.5 kg    Examination:  General exam: No distress.  Extremely poor historian.  Chronically ill looking and looks older than stated age Respiratory system: Bilateral decreased breath sounds at bases with bibasilar crackles, no wheezing  cardiovascular system: S1-S2 heard, rate controlled Gastrointestinal system: Abdomen is nondistended, soft and nontender.  Normal bowel sounds heard  extremities: Lower extremity edema present bilaterally.  No clubbing or cyanosis    Data Reviewed: I have personally reviewed following labs and imaging studies  CBC: Recent Labs  Lab 07/18/20 1000 07/18/20 1803 07/19/20 1056 07/20/20 1201  WBC 3.2* 3.7* 3.3* 3.3*  HGB 11.6* 11.8* 11.0* 10.7*  HCT 33.3* 33.9* 32.1*  31.8*  MCV 84.9 85.6 84.9 86.2  PLT 239 221 179 361   Basic Metabolic Panel: Recent Labs  Lab 07/18/20 1000 07/18/20 1000 07/18/20 1803 07/18/20 1803 07/19/20 1056 07/19/20 2048 07/20/20 0817 07/20/20 1814 07/21/20 0240  NA 116*   < > 121*   < > 119* 121* 124* 125* 128*  K 4.3  --  4.4  --  4.5  --  4.5  --  4.2  CL 82*  --  88*  --  89*  --  92*  --  94*  CO2 21*  --  21*  --  23  --  24  --  26  GLUCOSE 146*  --  142*  --  109*  --  141*  --  155*  BUN 15  --  16  --  18  --  20  --  26*  CREATININE 1.00  --  0.98  --  1.31*  --  1.50*  --  1.89*  CALCIUM 8.1*  --  8.1*  --  8.2*  --  8.3*  --  8.2*  MG  --   --   --   --  1.8  --   --   --  1.8   < > = values in this interval not displayed.   GFR: Estimated Creatinine Clearance: 44.3 mL/min (A) (by C-G formula based on SCr of 1.89 mg/dL (H)). Liver Function Tests: Recent Labs  Lab 07/18/20 1803 07/19/20 1056 07/20/20 0817 07/21/20 0240  AST 27 25 25 24   ALT 20 17 17 17   ALKPHOS 50 48 52 55  BILITOT 0.8 1.0 0.6 0.5  PROT 6.0* 5.7* 5.8* 5.6*  ALBUMIN 2.7* 2.5* 2.5* 2.4*   No results for input(s): LIPASE, AMYLASE in the last 168 hours. Recent Labs  Lab 07/18/20 1415  AMMONIA 13   Coagulation Profile: No results for input(s): INR, PROTIME in the last 168 hours. Cardiac Enzymes: No results for input(s): CKTOTAL, CKMB, CKMBINDEX, TROPONINI in the last 168 hours. BNP (last 3 results) No results for input(s): PROBNP in the last 8760 hours. HbA1C: No results for input(s): HGBA1C in the last 72 hours. CBG: Recent Labs  Lab 07/20/20 0745 07/20/20 1107 07/20/20 1629 07/20/20 2035 07/21/20 0756  GLUCAP 139* 134* 292* 90 157*   Lipid Profile: No results for input(s): CHOL, HDL, LDLCALC, TRIG, CHOLHDL, LDLDIRECT in the last 72 hours. Thyroid Function Tests: Recent Labs    07/18/20 1803  TSH 4.249   Anemia Panel: Recent Labs    07/18/20 1803  VITAMINB12 160*  FOLATE 17.1   Sepsis Labs: No results  for input(s): PROCALCITON, LATICACIDVEN in the last 168 hours.  Recent Results (from the past 240 hour(s))  SARS Coronavirus 2 by RT PCR (hospital order, performed in [facility] hospital lab) Nasopharyngeal Urine, Clean Catch     Status: None   Collection Time: 07/18/20 12:23 PM   Specimen: Urine, Clean Catch; Nasopharyngeal  Result Value Ref Range Status   SARS Coronavirus 2 NEGATIVE NEGATIVE Final    Comment: (NOTE) SARS-CoV-2 target nucleic acids are NOT DETECTED.  The SARS-CoV-2 RNA is generally detectable in upper and lower respiratory specimens during the acute phase of infection. The lowest concentration of SARS-CoV-2 viral copies this assay can detect is 250 copies / mL. A negative result does not preclude SARS-CoV-2 infection and should not be used as the sole basis for treatment or other patient management decisions.  A negative result may occur with improper specimen collection / handling, submission of specimen other than nasopharyngeal swab, presence of viral mutation(s) within the areas targeted by this assay, and inadequate number of viral copies (<250 copies / mL). A negative result must be combined with clinical observations, patient history, and epidemiological information.  Fact Sheet for Patients:   StrictlyIdeas.no  Fact Sheet for Healthcare Providers: BankingDealers.co.za  This test is not yet approved or  cleared by the [place] FDA and has been authorized for detection and/or diagnosis of SARS-CoV-2 by FDA under an Emergency Use Authorization (EUA).  This EUA will remain in effect (meaning this test can be used) for the duration of the COVID-19 declaration under Section 564(b)(1) of the Act, 21 U.S.C. section 360bbb-3(b)(1), unless the authorization is terminated or revoked sooner.  Performed at [facility], [place] [address], [place], [place] [address]          Radiology  Studies: No results found.      Scheduled Meds: . amiodarone  200 mg Oral Daily  . atorvastatin  20 mg Oral q1800  . cyanocobalamin  1,000 mcg Intramuscular Daily  . folic acid  1 mg Oral Daily  . furosemide  60 mg Intravenous BID  . gabapentin  600 mg  Oral QHS  . heparin  5,000 Units Subcutaneous Q8H  . insulin aspart  0-9 Units Subcutaneous TID WC  . isosorbide-hydrALAZINE  0.5 tablet Oral TID  . lidocaine  1 patch Transdermal Q24H  . multivitamin with minerals  1 tablet Oral Daily  . senna-docusate  1 tablet Oral BID  . thiamine  100 mg Oral Daily   Or  . thiamine  100 mg Intravenous Daily   Continuous Infusions:        Aline August, MD [facility] 07/21/2020, 8:09 AM

## 2020-07-22 LAB — GLUCOSE, CAPILLARY
Glucose-Capillary: 582 mg/dL (ref 70–99)
Glucose-Capillary: 65 mg/dL — ABNORMAL LOW (ref 70–99)
Glucose-Capillary: 63 mg/dL — ABNORMAL LOW (ref 70–99)
Glucose-Capillary: 70 mg/dL (ref 70–99)
Glucose-Capillary: 60 mg/dL — ABNORMAL LOW (ref 70–99)
Glucose-Capillary: 61 mg/dL — ABNORMAL LOW (ref 70–99)
Glucose-Capillary: 93 mg/dL (ref 70–99)
Glucose-Capillary: 106 mg/dL — ABNORMAL HIGH (ref 70–99)
Glucose-Capillary: 158 mg/dL — ABNORMAL HIGH (ref 70–99)

## 2020-07-22 LAB — BASIC METABOLIC PANEL
Anion gap: 7 (ref 5–15)
BUN: 25 mg/dL — ABNORMAL HIGH (ref 6–20)
CO2: 27 mmol/L (ref 22–32)
Calcium: 8.1 mg/dL — ABNORMAL LOW (ref 8.9–10.3)
Chloride: 93 mmol/L — ABNORMAL LOW (ref 98–111)
Creatinine, Ser: 1.58 mg/dL — ABNORMAL HIGH (ref 0.61–1.24)
GFR calc Af Amer: 54 mL/min — ABNORMAL LOW (ref >60)
GFR calc non Af Amer: 47 mL/min — ABNORMAL LOW (ref >60)
Glucose, Bld: 160 mg/dL — ABNORMAL HIGH (ref 70–99)
Potassium: 4.1 mmol/L (ref 3.5–5.1)
Sodium: 127 mmol/L — ABNORMAL LOW (ref 135–145)

## 2020-07-22 LAB — MAGNESIUM: Magnesium: 1.7 mg/dL (ref 1.7–2.4)

## 2020-07-22 MED ORDER — MAGNESIUM SULFATE 2 GM/50ML IV SOLN
2.0000 g | Freq: Once | INTRAVENOUS | Status: AC
  Administered 2020-07-22: 2 g via INTRAVENOUS
  Filled 2020-07-22: qty 50

## 2020-07-22 MED ORDER — DIGOXIN 125 MCG PO TABS
0.1250 mg | ORAL_TABLET | Freq: Every day | ORAL | Status: DC
  Administered 2020-07-22 – 2020-07-27 (×6): 0.125 mg via ORAL
  Filled 2020-07-22 (×6): qty 1

## 2020-07-22 MED ORDER — DIPHENHYDRAMINE HCL 25 MG PO CAPS
25.0000 mg | ORAL_CAPSULE | Freq: Three times a day (TID) | ORAL | Status: DC | PRN
  Administered 2020-07-22 – 2020-07-26 (×4): 25 mg via ORAL
  Filled 2020-07-22 (×4): qty 1

## 2020-07-22 MED ORDER — FUROSEMIDE 10 MG/ML IJ SOLN
60.0000 mg | Freq: Every day | INTRAMUSCULAR | Status: DC
  Filled 2020-07-22: qty 6

## 2020-07-22 MED ORDER — OXYCODONE-ACETAMINOPHEN 5-325 MG PO TABS
1.0000 | ORAL_TABLET | ORAL | Status: DC | PRN
  Administered 2020-07-22 – 2020-07-24 (×10): 1 via ORAL
  Filled 2020-07-22 (×10): qty 1

## 2020-07-22 MED ORDER — INSULIN ASPART 100 UNIT/ML ~~LOC~~ SOLN
24.0000 [IU] | Freq: Once | SUBCUTANEOUS | Status: AC
  Administered 2020-07-22: 24 [IU] via SUBCUTANEOUS

## 2020-07-22 NOTE — Progress Notes (Signed)
   Providing Compassionate, Quality Care - Together   Subjective: Patient reports neck pain that he doesn't feel is controlled with hydrocodone. He states the hydrocodone also makes him itch. He has continued numbness and spasticity in BLE.  Objective: Vital signs in last 24 hours: Temp:  [97.8 F (36.6 C)-98.7 F (37.1 C)] 98.7 F (37.1 C) (08/01 0743) Pulse Rate:  [63-77] 74 (08/01 0743) Resp:  [18-20] 20 (08/01 0743) BP: (100-125)/(77-87) 125/77 (08/01 0743) SpO2:  [94 %-99 %] 98 % (08/01 0743) Weight:  [80 kg] 80 kg (08/01 0328)  Intake/Output from previous day: 07/31 0701 - 08/01 0700 In: 360 [P.O.:360] Out: 1875 [Urine:1875] Intake/Output this shift: Total I/O In: -  Out: 375 [Urine:375]  Awake, alert, oriented  Speech fluent, appropriate  CNs grossly intact  Poor dentition 4/5 BUE/BLE + hoffmans bilaterally  Lab Results: Recent Labs    07/20/20 1201  WBC 3.3*  HGB 10.7*  HCT 31.8*  PLT 177   BMET Recent Labs    07/21/20 0240 07/22/20 0314  NA 128* 127*  K 4.2 4.1  CL 94* 93*  CO2 26 27  GLUCOSE 155* 160*  BUN 26* 25*  CREATININE 1.89* 1.58*  CALCIUM 8.2* 8.1*    Studies/Results: No results found.  Assessment/Plan: Patient admitted with hyponatremia  LOS: 4 days   - Planned ACDF C4-6 on Tuesday 8/3 with Dr. Reatha Armour. - Pain control-pt switched to oxycodone/acetaminophen - Utilize already ordered methocarbamol for muscle spasms   Viona Gilmore, DNP, AGNP-C Nurse Practitioner  [facility] [place]. [address], [address], [place], [place] [address] P: [phone]    F: [phone]  07/22/2020, 11:37 AM

## 2020-07-22 NOTE — Progress Notes (Signed)
Patient ID: Brad [name], male   DOB: 1961/09/01, 59 y.o.   MRN: [id number]  PROGRESS NOTE    Brad [name]  OJJ:[id number] DOB: November 29, 1961 DOA: 07/18/2020 PCP: Charlott Rakes, MD   Brief Narrative:  59 year old male with history of chronic systolic heart failure with EF of 35 to 40% on echo on 04/03/2020, hypertension, chronic hyponatremia, alcohol abuse, type 2 diabetes mellitus, history of hepatitis C virus with liver cirrhosis, depression, recent multiple hospitalizations with rib pain/chest pain hyponatremia presented with severe right rib pain along with continued jerking of the right leg and other body parts as well.  In the ED, sodium was 116, he was started on IV fluids.  Nephrology was consulted.  Chest x-ray showed mildly displaced fracture of the anterolateral right fifth rib and nondisplaced anterior right severe right sixth rib fracture.  Neurology was consulted for?  Myoclonic jerking.  EEG did not show any seizure.  MRI of the cervical spine showed severe spinal stenosis at C4-C5 C5-C6 levels with moderate cord mass-effect.  Neurosurgery was consulted who recommended that patient be transferred to [place]:   Acute on chronic hyponatremia -Presented with a sodium level of 116.  Recent admission with similar hyponatremia with history of chronic hyponatremia and upon discharge, sodium level was 129 after a fluid restriction and IV Lasix.  He had also received a dose of tolvaptan at one point -Because of fluid overload -Nephrology/cardiology following.  Sodium level 127 this morning.  Continue fluid restriction. -Received a dose of tolvaptan on 07/19/2020 and on 07/20/2020.  Currently on IV Lasix as per cardiology.  Severe cervical stenosis at C4-C5 and C5-C6 levels with moderate cord mass-effect Abnormal jerking movements of extremities -MRI of the cervical spine with findings as above.  Neurosurgery following.  For probable surgical intervention on  Tuesday -Still having some jerky movements of right leg.  Neurology evaluation appreciated: Neurology has signed off.  EEG did not show any seizure activity.  Right rib pain secondary to right rib fracture from a fall more than a month ago -Chest x-ray showed Mildly displaced fracture of the anterolateral right rib fifth rib perhaps marginally more displaced than on prior study.Nondisplaced anterior severe right sixth rib fracture.  -Continue lidocaine patch, tramadol and hydrocodone for pain control.  Patient keeps requesting for morphine.  I remember him from recent hospitalization during which he kept asking were something with "M" and would later ask for morphine continuously.  Will hold off on intravenous narcotics for now.  Acute on chronic systolic CHF with EF of 35 to 40% on echo on 04/03/2020 Acute kidney injury -Strict input and output.  Daily weights.  Fluid restriction.  -Heart failure team following.  Currently on IV Lasix along with isosorbide- hydralazine. -Monitor creatinine.  Creatinine 1.58 today, improving compared to yesterday  Essential hypertension -Blood pressure intermittently elevated.  Antihypertensives plan as above.  Paroxysmal A. Fib -Continue amiodarone.  Aspirin discontinued by neurosurgery for possible need for surgical intervention.  Currently rate controlled  Alcohol abuse -Monitor signs of withdrawal.  Continue CIWA protocol.  Social worker consult.  Reports that he has not been drinking much recently  Chronic HCV with liver cirrhosis -LFTs stable on presentation.  Outpatient follow-up.  No signs of decompensation at this point  Mild leukopenia -Probably from cirrhosis.  Monitor intermittently  Anemia of chronic disease -Probably from cirrhosis.  Hemoglobin stable recently.  Monitor intermittently  Generalized deconditioning -will need PT eval once cleared by neurosurgery  DVT prophylaxis: SCDs.  Subcutaneous heparin  code Status: Full Family  Communication: Spoke to patient at bedside Disposition Plan: Status is: Inpatient  Remains inpatient appropriate because:Inpatient level of care appropriate due to severity of illness   Dispo: The patient is from: Home              Anticipated d/c is to: Home              Anticipated d/c date is: > 3 days              Patient currently is not medically stable to d/c.   Consultants: Neurology/nephrology/neurosurgery  Procedures: EEG on 07/18/2020: No seizures  Antimicrobials: None   Subjective: Patient seen and examined at bedside.  Extremely poor historian.  Complains of neck pain and rib pain.  No worsening shortness of breath, fever or vomiting reported  objective: Vitals:   07/21/20 1737 07/21/20 2052 07/22/20 0328 07/22/20 0743  BP: 100/79 (!) 115/87 (!) 124/87 125/77  Pulse:  63 77 74  Resp:  18 18 20   Temp:  98.1 F (36.7 C) 98.4 F (36.9 C) 98.7 F (37.1 C)  TempSrc:  Oral Oral Oral  SpO2:  99% 99% 98%  Weight:   80 kg   Height:        Intake/Output Summary (Last 24 hours) at 07/22/2020 0804 Last data filed at 07/22/2020 0329 Gross per 24 hour  Intake 360 ml  Output 1875 ml  Net -1515 ml   Filed Weights   07/20/20 0433 07/21/20 0534 07/22/20 0328  Weight: 80.6 kg 80.5 kg 80 kg    Examination:  General exam: No acute distress.  Extremely poor historian.  Chronically ill looking and looks older than stated age Respiratory system: Bilateral decreased breath sounds at bases with basilar crackles cardiovascular system: Rate controlled, S1-S2 heard Gastrointestinal system: Abdomen is nondistended, soft and nontender.  Bowel sounds are heard  extremities: No clubbing.  Bilateral lower extremity edema present   Data Reviewed: I have personally reviewed following labs and imaging studies  CBC: Recent Labs  Lab 07/18/20 1000 07/18/20 1803 07/19/20 1056 07/20/20 1201  WBC 3.2* 3.7* 3.3* 3.3*  HGB 11.6* 11.8* 11.0* 10.7*  HCT 33.3* 33.9* 32.1* 31.8*  MCV  84.9 85.6 84.9 86.2  PLT 239 221 179 518   Basic Metabolic Panel: Recent Labs  Lab 07/18/20 1803 07/18/20 1803 07/19/20 1056 07/19/20 1056 07/19/20 2048 07/20/20 0817 07/20/20 1814 07/21/20 0240 07/22/20 0314  NA 121*   < > 119*   < > 121* 124* 125* 128* 127*  K 4.4  --  4.5  --   --  4.5  --  4.2 4.1  CL 88*  --  89*  --   --  92*  --  94* 93*  CO2 21*  --  23  --   --  24  --  26 27  GLUCOSE 142*  --  109*  --   --  141*  --  155* 160*  BUN 16  --  18  --   --  20  --  26* 25*  CREATININE 0.98  --  1.31*  --   --  1.50*  --  1.89* 1.58*  CALCIUM 8.1*  --  8.2*  --   --  8.3*  --  8.2* 8.1*  MG  --   --  1.8  --   --   --   --  1.8 1.7   < > =  values in this interval not displayed.   GFR: Estimated Creatinine Clearance: 53 mL/min (A) (by C-G formula based on SCr of 1.58 mg/dL (H)). Liver Function Tests: Recent Labs  Lab 07/18/20 1803 07/19/20 1056 07/20/20 0817 07/21/20 0240  AST 27 25 25 24   ALT 20 17 17 17   ALKPHOS 50 48 52 55  BILITOT 0.8 1.0 0.6 0.5  PROT 6.0* 5.7* 5.8* 5.6*  ALBUMIN 2.7* 2.5* 2.5* 2.4*   No results for input(s): LIPASE, AMYLASE in the last 168 hours. Recent Labs  Lab 07/18/20 1415  AMMONIA 13   Coagulation Profile: No results for input(s): INR, PROTIME in the last 168 hours. Cardiac Enzymes: No results for input(s): CKTOTAL, CKMB, CKMBINDEX, TROPONINI in the last 168 hours. BNP (last 3 results) No results for input(s): PROBNP in the last 8760 hours. HbA1C: No results for input(s): HGBA1C in the last 72 hours. CBG: Recent Labs  Lab 07/20/20 2035 07/21/20 0756 07/21/20 1135 07/21/20 1659 07/21/20 2121  GLUCAP 90 157* 152* 150* 167*   Lipid Profile: No results for input(s): CHOL, HDL, LDLCALC, TRIG, CHOLHDL, LDLDIRECT in the last 72 hours. Thyroid Function Tests: No results for input(s): TSH, T4TOTAL, FREET4, T3FREE, THYROIDAB in the last 72 hours. Anemia Panel: No results for input(s): VITAMINB12, FOLATE, FERRITIN, TIBC,  IRON, RETICCTPCT in the last 72 hours. Sepsis Labs: No results for input(s): PROCALCITON, LATICACIDVEN in the last 168 hours.  Recent Results (from the past 240 hour(s))  SARS Coronavirus 2 by RT PCR (hospital order, performed in [facility] hospital lab) Nasopharyngeal Urine, Clean Catch     Status: None   Collection Time: 07/18/20 12:23 PM   Specimen: Urine, Clean Catch; Nasopharyngeal  Result Value Ref Range Status   SARS Coronavirus 2 NEGATIVE NEGATIVE Final    Comment: (NOTE) SARS-CoV-2 target nucleic acids are NOT DETECTED.  The SARS-CoV-2 RNA is generally detectable in upper and lower respiratory specimens during the acute phase of infection. The lowest concentration of SARS-CoV-2 viral copies this assay can detect is 250 copies / mL. A negative result does not preclude SARS-CoV-2 infection and should not be used as the sole basis for treatment or other patient management decisions.  A negative result may occur with improper specimen collection / handling, submission of specimen other than nasopharyngeal swab, presence of viral mutation(s) within the areas targeted by this assay, and inadequate number of viral copies (<250 copies / mL). A negative result must be combined with clinical observations, patient history, and epidemiological information.  Fact Sheet for Patients:   StrictlyIdeas.no  Fact Sheet for Healthcare Providers: BankingDealers.co.za  This test is not yet approved or  cleared by the [place] FDA and has been authorized for detection and/or diagnosis of SARS-CoV-2 by FDA under an Emergency Use Authorization (EUA).  This EUA will remain in effect (meaning this test can be used) for the duration of the COVID-19 declaration under Section 564(b)(1) of the Act, 21 U.S.C. section 360bbb-3(b)(1), unless the authorization is terminated or revoked sooner.  Performed at [facility], [place]  [address] [place], [place] [address]          Radiology Studies: No results found.      Scheduled Meds: . amiodarone  200 mg Oral Daily  . atorvastatin  20 mg Oral q1800  . cyanocobalamin  1,000 mcg Intramuscular Daily  . folic acid  1 mg Oral Daily  . furosemide  60 mg Intravenous BID  . gabapentin  600 mg Oral QHS  .  heparin  5,000 Units Subcutaneous Q8H  . insulin aspart  0-9 Units Subcutaneous TID WC  . isosorbide-hydrALAZINE  1 tablet Oral TID  . lidocaine  1 patch Transdermal Q24H  . multivitamin with minerals  1 tablet Oral Daily  . senna-docusate  1 tablet Oral BID  . thiamine  100 mg Oral Daily   Or  . thiamine  100 mg Intravenous Daily   Continuous Infusions:        Aline August, MD [facility] 07/22/2020, 8:04 AM

## 2020-07-22 NOTE — Progress Notes (Signed)
Patient ID: Brad [name], male   DOB: Aug 25, 1961, 59 y.o.   MRN: [id number]     Advanced Heart Failure Rounding Note  PCP-Cardiologist: [name], MD   Subjective:    Weight down with IV Lasix.  Sodium 127, creatinine down to 1.58.   No dyspnea, still some jerking of right leg.  Has a lot of questions about surgery.    Objective:   Weight Range: 80 kg Body mass index is 24.59 kg/m.   Vital Signs:   Temp:  [97.8 F (36.6 C)-98.7 F (37.1 C)] 98.7 F (37.1 C) (08/01 0743) Pulse Rate:  [63-77] 74 (08/01 0743) Resp:  [18-20] 20 (08/01 0743) BP: (100-125)/(77-87) 125/77 (08/01 0743) SpO2:  [94 %-99 %] 98 % (08/01 0743) Weight:  [80 kg] 80 kg (08/01 0328) Last BM Date: 07/20/20  Weight change: Filed Weights   07/20/20 0433 07/21/20 0534 07/22/20 0328  Weight: 80.6 kg 80.5 kg 80 kg    Intake/Output:   Intake/Output Summary (Last 24 hours) at 07/22/2020 1032 Last data filed at 07/22/2020 0329 Gross per 24 hour  Intake --  Output 900 ml  Net -900 ml      Physical Exam    General: NAD Neck: JVP 10 cm, no thyromegaly or thyroid nodule.  Lungs: Clear to auscultation bilaterally with normal respiratory effort. CV: Nondisplaced PMI.  Heart regular S1/S2, no S3/S4, 2/6 HSM LLSB/apex.  No peripheral edema.   Abdomen: Soft, nontender, no hepatosplenomegaly, no distention.  Skin: Intact without lesions or rashes.  Neurologic: Alert and oriented x 3.  Psych: Normal affect. Extremities: No clubbing or cyanosis.  HEENT: Normal.    Telemetry   Atrial fibrillation rate 60s (personally reviewed).   Labs    CBC Recent Labs    07/19/20 1056 07/20/20 1201  WBC 3.3* 3.3*  HGB 11.0* 10.7*  HCT 32.1* 31.8*  MCV 84.9 86.2  PLT 179 427   Basic Metabolic Panel Recent Labs    07/21/20 0240 07/22/20 0314  NA 128* 127*  K 4.2 4.1  CL 94* 93*  CO2 26 27  GLUCOSE 155* 160*  BUN 26* 25*  CREATININE 1.89* 1.58*  CALCIUM 8.2* 8.1*  MG 1.8 1.7   Liver Function  Tests Recent Labs    07/20/20 0817 07/21/20 0240  AST 25 24  ALT 17 17  ALKPHOS 52 55  BILITOT 0.6 0.5  PROT 5.8* 5.6*  ALBUMIN 2.5* 2.4*   No results for input(s): LIPASE, AMYLASE in the last 72 hours. Cardiac Enzymes No results for input(s): CKTOTAL, CKMB, CKMBINDEX, TROPONINI in the last 72 hours.  BNP: BNP (last 3 results) Recent Labs    05/29/20 0430 06/21/20 0626 06/30/20 0657  BNP 678.3* 584.3* 656.4*    ProBNP (last 3 results) No results for input(s): PROBNP in the last 8760 hours.   D-Dimer No results for input(s): DDIMER in the last 72 hours. Hemoglobin A1C No results for input(s): HGBA1C in the last 72 hours. Fasting Lipid Panel No results for input(s): CHOL, HDL, LDLCALC, TRIG, CHOLHDL, LDLDIRECT in the last 72 hours. Thyroid Function Tests No results for input(s): TSH, T4TOTAL, T3FREE, THYROIDAB in the last 72 hours.  Invalid input(s): FREET3  Other results:   Imaging    No results found.   Medications:     Scheduled Medications: . amiodarone  200 mg Oral Daily  . atorvastatin  20 mg Oral q1800  . cyanocobalamin  1,000 mcg Intramuscular Daily  . digoxin  0.125 mg Oral Daily  .  folic acid  1 mg Oral Daily  . furosemide  60 mg Intravenous BID  . gabapentin  600 mg Oral QHS  . heparin  5,000 Units Subcutaneous Q8H  . insulin aspart  0-9 Units Subcutaneous TID WC  . isosorbide-hydrALAZINE  1 tablet Oral TID  . lidocaine  1 patch Transdermal Q24H  . multivitamin with minerals  1 tablet Oral Daily  . senna-docusate  1 tablet Oral BID  . thiamine  100 mg Oral Daily   Or  . thiamine  100 mg Intravenous Daily    Infusions: . magnesium sulfate bolus IVPB      PRN Medications: acetaminophen **OR** acetaminophen, bisacodyl, HYDROcodone-acetaminophen, loratadine, methocarbamol, ondansetron **OR** ondansetron (ZOFRAN) IV, polyethylene glycol, promethazine, senna-docusate, traMADol   Assessment/Plan   1. Pre-operative evaluation: Patient  had cath in 6/21 with nonobstructive coronary disease (and elevated filling pressures).  He has atypical, non-cardiac chest pain (probably from prior rib fractures).  Currently, he is volume overloaded on exam and also significantly hyponatremic (119 last check, still pending this morning).  He needs cervical fusion for c-spine stenosis and cord compression.  I think that surgery is reasonable now that sodium is improved (up to 127).  Timing per neurosurgery, looks like planned for Tuesday.  2. Hyponatremia:Recurrent.[id number] at admission. Hypervolemic hyponatremia in setting of excess fluid intake at home.  He has had tolvaptan x 2 now with Na 127 today.  - With volume overload, would avoid salt tablets and further IV fluid.  - No tolvaptan today, continue aggressive po fluid restriction (1.2 L).    2. Acute on chronic systolic CHF: Echo (1/44) EF 35%, moderately reduced RV fxn, moderate-severe MR, moderate TR. Nonischemic cardiomyopathy with nonobstructive CAD on 6/21 cath, probably due to substance abuse (ETOH, prior cocaine). He diuresed well yesterday but is still volume overloaded on exam.  Creatinine 1.5 today.   - Continue Lasix 60 mg IV bid. -Will start digoxin 0.125 and follow HR.  -Continue Bidil 1 tab tid.  3. Atrial fibrillation: Chronic. He has been off anticoagulation due toheavyETOH abuse and frequent falls.  -Off Coreg with low HR in past.  - Without anticoagulation, DCCV is not an option. - If he quits drinking and improves stability, start Eliquis. 4. ETOH abuse: Ideally needs rehab program.Still drinking some at home, says he has cut back a lot.  5. PVCs: Amiodarone begun at last appointment due to frequent PVCs.   - Continue amiodarone.  - Follow on telemetry.  6. Syncope: Has had in the past, has fractured ribs.  Hard to know if this has been due to ETOH intoxication or arrhythmias.  He has been deemed not a candidate for ICD due to ETOH abuse.    Length of  Stay: 4  Loralie Champagne, MD  07/22/2020, 10:32 AM  Advanced Heart Failure Team Pager [phone] (M-F; 7a - 4p)  Please contact [facility] for night-coverage after hours (4p -7a ) and weekends on [email or web address]

## 2020-07-22 NOTE — Progress Notes (Signed)
Admit: 07/18/2020 LOS: 4  Brad [name] AoC HFREF NICM, AoC Hyponatremia, cervical stenosis needing decompression. CKD3  Subjective:  . SNa 12 . SCr 1.6 . 1.9 L UOP  07/31 0701 - 08/01 0700 In: 360 [P.O.:360] Out: 1875 [Urine:1875]  Filed Weights   07/20/20 0433 07/21/20 0534 07/22/20 0328  Weight: 80.6 kg 80.5 kg 80 kg    Scheduled Meds: . amiodarone  200 mg Oral Daily  . atorvastatin  20 mg Oral q1800  . cyanocobalamin  1,000 mcg Intramuscular Daily  . digoxin  0.125 mg Oral Daily  . folic acid  1 mg Oral Daily  . [START ON 07/23/2020] furosemide  60 mg Intravenous Daily  . gabapentin  600 mg Oral QHS  . heparin  5,000 Units Subcutaneous Q8H  . insulin aspart  0-9 Units Subcutaneous TID WC  . isosorbide-hydrALAZINE  1 tablet Oral TID  . lidocaine  1 patch Transdermal Q24H  . multivitamin with minerals  1 tablet Oral Daily  . senna-docusate  1 tablet Oral BID  . thiamine  100 mg Oral Daily   Or  . thiamine  100 mg Intravenous Daily   Continuous Infusions: PRN Meds:.acetaminophen **OR** acetaminophen, bisacodyl, diphenhydrAMINE, loratadine, methocarbamol, ondansetron **OR** ondansetron (ZOFRAN) IV, oxyCODONE-acetaminophen, polyethylene glycol, promethazine, senna-docusate, traMADol  Current Labs: reviewed  7/28 UNa 11, UOsm 122 7/28 SOsm 257  Physical Exam:  Blood pressure (!) 131/76, pulse 55, temperature 97.8 F (36.6 C), temperature source Oral, resp. rate 18, height 5\' 11"  (1.803 m), weight 80 kg, SpO2 100 %. NAD 1+LEE RRR  CTAB S/nt/nd Nonfocal, CN2-12 intact  A 1. Hypervolemic Hyponatremia 2/2 #2, improved 2. AoC HFrEF, diureing, NICM 3. Cervical stenosis for deompression next week 4. CKD3, mild AKI likely related to hemodynamics/diuretics/tolvaptan 5. Hx/o ETOH abuse 6. AFib, chronic.   P . Reduce lasix to qDay for now while SCr recovers . Would not dose any more vaptan . Continue 1.2Lfluid restrict . Would not use Vaptan today . Medication  Issues; o Preferred narcotic agents for pain control are hydromorphone, fentanyl, and methadone. Morphine should not be used.  o Baclofen should be avoided o Avoid oral sodium phosphate and magnesium citrate based laxatives / bowel preps    Pearson Grippe MD 07/22/2020, 3:01 PM  Recent Labs  Lab 07/20/20 0817 07/20/20 0817 07/20/20 1814 07/21/20 0240 07/22/20 0314  NA 124*   < > 125* 128* 127*  K 4.5  --   --  4.2 4.1  CL 92*  --   --  94* 93*  CO2 24  --   --  26 27  GLUCOSE 141*  --   --  155* 160*  BUN 20  --   --  26* 25*  CREATININE 1.50*  --   --  1.89* 1.58*  CALCIUM 8.3*  --   --  8.2* 8.1*   < > = values in this interval not displayed.   Recent Labs  Lab 07/18/20 1803 07/19/20 1056 07/20/20 1201  WBC 3.7* 3.3* 3.3*  HGB 11.8* 11.0* 10.7*  HCT 33.9* 32.1* 31.8*  MCV 85.6 84.9 86.2  PLT 221 179 177

## 2020-07-23 ENCOUNTER — Telehealth: Payer: Self-pay

## 2020-07-23 LAB — GLUCOSE, CAPILLARY
Glucose-Capillary: 167 mg/dL — ABNORMAL HIGH (ref 70–99)
Glucose-Capillary: 182 mg/dL — ABNORMAL HIGH (ref 70–99)
Glucose-Capillary: 203 mg/dL — ABNORMAL HIGH (ref 70–99)
Glucose-Capillary: 141 mg/dL — ABNORMAL HIGH (ref 70–99)

## 2020-07-23 LAB — BASIC METABOLIC PANEL
Anion gap: 7 (ref 5–15)
BUN: 25 mg/dL — ABNORMAL HIGH (ref 6–20)
CO2: 27 mmol/L (ref 22–32)
Calcium: 8.3 mg/dL — ABNORMAL LOW (ref 8.9–10.3)
Chloride: 93 mmol/L — ABNORMAL LOW (ref 98–111)
Creatinine, Ser: 1.37 mg/dL — ABNORMAL HIGH (ref 0.61–1.24)
GFR calc Af Amer: >60 mL/min (ref >60)
GFR calc non Af Amer: 56 mL/min — ABNORMAL LOW (ref >60)
Glucose, Bld: 174 mg/dL — ABNORMAL HIGH (ref 70–99)
Potassium: 4.1 mmol/L (ref 3.5–5.1)
Sodium: 127 mmol/L — ABNORMAL LOW (ref 135–145)

## 2020-07-23 LAB — MAGNESIUM: Magnesium: 2 mg/dL (ref 1.7–2.4)

## 2020-07-23 LAB — SURGICAL PCR SCREEN
MRSA, PCR: NEGATIVE
Staphylococcus aureus: POSITIVE — AB

## 2020-07-23 MED ORDER — ZOLPIDEM TARTRATE 5 MG PO TABS
5.0000 mg | ORAL_TABLET | Freq: Once | ORAL | Status: AC
  Administered 2020-07-23: 5 mg via ORAL
  Filled 2020-07-23: qty 1

## 2020-07-23 MED ORDER — CHLORHEXIDINE GLUCONATE CLOTH 2 % EX PADS
6.0000 | MEDICATED_PAD | Freq: Once | CUTANEOUS | Status: AC
  Administered 2020-07-23: 6 via TOPICAL

## 2020-07-23 MED ORDER — CEFAZOLIN SODIUM-DEXTROSE 2-4 GM/100ML-% IV SOLN
2.0000 g | INTRAVENOUS | Status: AC
  Administered 2020-07-24: 2 g via INTRAVENOUS
  Filled 2020-07-23: qty 100

## 2020-07-23 MED ORDER — MUPIROCIN 2 % EX OINT
1.0000 "application " | TOPICAL_OINTMENT | Freq: Two times a day (BID) | CUTANEOUS | Status: DC
  Administered 2020-07-23 – 2020-07-24 (×2): 1 via NASAL
  Filled 2020-07-23: qty 22

## 2020-07-23 MED ORDER — TORSEMIDE 20 MG PO TABS
40.0000 mg | ORAL_TABLET | Freq: Every day | ORAL | Status: DC
  Filled 2020-07-23: qty 2

## 2020-07-23 MED ORDER — CHLORHEXIDINE GLUCONATE CLOTH 2 % EX PADS: 6.0000 | MEDICATED_PAD | Freq: Once | CUTANEOUS | Status: DC

## 2020-07-23 MED ORDER — TORSEMIDE 20 MG PO TABS
20.0000 mg | ORAL_TABLET | Freq: Every day | ORAL | Status: DC
  Administered 2020-07-23: 20 mg via ORAL
  Filled 2020-07-23: qty 1

## 2020-07-23 MED ORDER — CHLORHEXIDINE GLUCONATE CLOTH 2 % EX PADS: 6.0000 | MEDICATED_PAD | Freq: Every day | CUTANEOUS | Status: DC

## 2020-07-23 NOTE — Progress Notes (Addendum)
Patient ID: Brad [name], male   DOB: [birth year], 59 y.o.   MRN: [id number]     Advanced Heart Failure Rounding Note  PCP-Cardiologist: [name], MD   Subjective:     Sodium 127, creatinine down to 1.4.   Yesterday diuresed with IV lasix x1. I/O not accurate.  Complaining of neck and rib pain. Also having a hard time sleeping.  Says he is nervous about surgery.   Objective:   Weight Range: 82.2 kg Body mass index is 25.29 kg/m.   Vital Signs:   Temp:  [97.8 F (36.6 C)-98.6 F (37 C)] 98.6 F (37 C) (08/02 0411) Pulse Rate:  [55-78] 72 (08/02 0411) Resp:  [16-18] 18 (08/02 0411) BP: (105-131)/(66-78) 111/67 (08/02 0411) SpO2:  [97 %-100 %] 98 % (08/02 0411) Weight:  [82.2 kg] 82.2 kg (08/02 0411) Last BM Date: 07/22/20  Weight change: Filed Weights   07/21/20 0534 07/22/20 0328 07/23/20 0411  Weight: 80.5 kg 80 kg 82.2 kg    Intake/Output:   Intake/Output Summary (Last 24 hours) at 07/23/2020 0821 Last data filed at 07/22/2020 2039 Gross per 24 hour  Intake 874.4 ml  Output --  Net 874.4 ml      Physical Exam  General:   No resp difficulty HEENT: normal except poor dentition.  Neck: supple. JVP 6-7 . Carotids 2+ bilat; no bruits. No lymphadenopathy or thryomegaly appreciated. Cor: PMI nondisplaced. Irregular rate & rhythm. No rubs, gallops or murmurs. Lungs: clear Abdomen: soft, nontender, nondistended. No hepatosplenomegaly. No bruits or masses. Good bowel sounds. Extremities: no cyanosis, clubbing, rash, edema Neuro: alert & orientedx3, cranial nerves grossly intact. moves all 4 extremities w/o difficulty. Affect pleasant   Telemetry    Afib 60s personally reviewed.   Labs    CBC Recent Labs    07/20/20 1201  WBC 3.3*  HGB 10.7*  HCT 31.8*  MCV 86.2  PLT 333   Basic Metabolic Panel Recent Labs    07/22/20 0314 07/23/20 0422  NA 127* 127*  K 4.1 4.1  CL 93* 93*  CO2 27 27  GLUCOSE 160* 174*  BUN 25* 25*  CREATININE 1.58*  1.37*  CALCIUM 8.1* 8.3*  MG 1.7 2.0   Liver Function Tests Recent Labs    07/21/20 0240  AST 24  ALT 17  ALKPHOS 55  BILITOT 0.5  PROT 5.6*  ALBUMIN 2.4*   No results for input(s): LIPASE, AMYLASE in the last 72 hours. Cardiac Enzymes No results for input(s): CKTOTAL, CKMB, CKMBINDEX, TROPONINI in the last 72 hours.  BNP: BNP (last 3 results) Recent Labs    05/29/20 0430 06/21/20 0626 06/30/20 0657  BNP 678.3* 584.3* 656.4*    ProBNP (last 3 results) No results for input(s): PROBNP in the last 8760 hours.   D-Dimer No results for input(s): DDIMER in the last 72 hours. Hemoglobin A1C No results for input(s): HGBA1C in the last 72 hours. Fasting Lipid Panel No results for input(s): CHOL, HDL, LDLCALC, TRIG, CHOLHDL, LDLDIRECT in the last 72 hours. Thyroid Function Tests No results for input(s): TSH, T4TOTAL, T3FREE, THYROIDAB in the last 72 hours.  Invalid input(s): FREET3  Other results:   Imaging    No results found.   Medications:     Scheduled Medications: . amiodarone  200 mg Oral Daily  . atorvastatin  20 mg Oral q1800  . cyanocobalamin  1,000 mcg Intramuscular Daily  . digoxin  0.125 mg Oral Daily  . folic acid  1 mg Oral Daily  .  furosemide  60 mg Intravenous Daily  . gabapentin  600 mg Oral QHS  . heparin  5,000 Units Subcutaneous Q8H  . insulin aspart  0-9 Units Subcutaneous TID WC  . isosorbide-hydrALAZINE  1 tablet Oral TID  . lidocaine  1 patch Transdermal Q24H  . multivitamin with minerals  1 tablet Oral Daily  . senna-docusate  1 tablet Oral BID  . thiamine  100 mg Oral Daily   Or  . thiamine  100 mg Intravenous Daily    Infusions:   PRN Medications: acetaminophen **OR** acetaminophen, bisacodyl, diphenhydrAMINE, loratadine, methocarbamol, ondansetron **OR** ondansetron (ZOFRAN) IV, oxyCODONE-acetaminophen, polyethylene glycol, promethazine, senna-docusate, traMADol   Assessment/Plan   1. Pre-operative evaluation:  Patient had cath in 6/21 with nonobstructive coronary disease (and elevated filling pressures).  He has atypical, non-cardiac chest pain (probably from prior rib fractures).  Currently, he is volume overloaded on exam and also significantly hyponatremic (119 last check, still pending this morning).  He needs cervical fusion for c-spine stenosis and cord compression.   - Per Dr Aundra Dubin  think that surgery is reasonable now that sodium is improved (up to 127).   Timing per neurosurgery, looks like planned for Tuesday.  2. Hyponatremia:Recurrent.[id number] at admission. Hypervolemic hyponatremia in setting of excess fluid intake at home.  He has had tolvaptan x 2. Sodium remains 127.   - No tolvaptan today, continue to restrict fluids. .    2. Acute on chronic systolic CHF: Echo (0/07) EF 35%, moderately reduced RV fxn, moderate-severe MR, moderate TR. Nonischemic cardiomyopathy with nonobstructive CAD on 6/21 cath, probably due to substance abuse (ETOH, prior cocaine).  - Volume status improved. Stop IV lasix. Start torsemide 20 mg daily (home dose).  - Creatinine down to 1.4 today. -Continue digoxin 0.125 mg daily. Heart rate in the 60s.  -Continue Bidil 1 tab tid.  3. Atrial fibrillation: Chronic. He has been off anticoagulation due toheavyETOH abuse and frequent falls.  -Off Coreg with low HR in past.  - Without anticoagulation, DCCV is not an option. - If he quits drinking and improves stability, start Eliquis. 4. ETOH abuse: Ideally needs rehab program.Still drinking some at home, says he has cut back a lot.  5. PVCs: Amiodarone begun at last appointment due to frequent PVCs.   - Continue amiodarone.  6. Syncope: Has had in the past, has fractured ribs.  Hard to know if this has been due to ETOH intoxication or arrhythmias.  He has been deemed not a candidate for ICD due to ETOH abuse.    Length of Stay: 5  Amy Clegg, NP  07/23/2020, 8:21 AM  Advanced Heart Failure Team Pager  [phone] (M-F; 7a - 4p)  Please contact [facility] for night-coverage after hours (4p -7a ) and weekends on [email or web address]  Agree with the above note.   Struggling to get accurate I/Os.  However, volume looks better.  Na stable at 127.  - Continue fluid restriction, would not give further tolvaptan.  - Would increase torsemide to 40 mg daily.  - Plan for surgery tomorrow.   Loralie Champagne 07/23/2020

## 2020-07-23 NOTE — Progress Notes (Signed)
Patient ID: Brad [name], male   DOB: 1961/04/24, 59 y.o.   MRN: [id number]  PROGRESS NOTE    [name]  KGM:[id number] DOB: 14-Dec-1961 DOA: 07/18/2020 PCP: Charlott Rakes, MD   Brief Narrative:  59 year old male with history of chronic systolic heart failure with EF of 35 to 40% on echo on 04/03/2020, hypertension, chronic hyponatremia, alcohol abuse, type 2 diabetes mellitus, history of hepatitis C virus with liver cirrhosis, depression, recent multiple hospitalizations with rib pain/chest pain hyponatremia presented with severe right rib pain along with continued jerking of the right leg and other body parts as well.  In the ED, sodium was 116, he was started on IV fluids.  Nephrology was consulted.  Chest x-ray showed mildly displaced fracture of the anterolateral right fifth rib and nondisplaced anterior right severe right sixth rib fracture.  Neurology was consulted for?  Myoclonic jerking.  EEG did not show any seizure.  MRI of the cervical spine showed severe spinal stenosis at C4-C5 C5-C6 levels with moderate cord mass-effect.  Neurosurgery was consulted who recommended that patient be transferred to [place]:   Acute on chronic hyponatremia -Presented with a sodium level of 116.  Recent admission with similar hyponatremia with history of chronic hyponatremia and upon discharge, sodium level was 129 after a fluid restriction and IV Lasix.  He had also received a dose of tolvaptan at one point -Because of fluid overload -Nephrology/cardiology following.  Sodium level 127 this morning.  Continue fluid restriction. -Received a dose of tolvaptan on 07/19/2020 and on 07/20/2020.  Currently on IV Lasix as per cardiology.  Severe cervical stenosis at C4-C5 and C5-C6 levels with moderate cord mass-effect Abnormal jerking movements of extremities -MRI of the cervical spine with findings as above.  Neurosurgery following.  For probable surgical intervention on  Tuesday -Still having some jerky movements of right leg.  Neurology evaluation appreciated: Neurology has signed off.  EEG did not show any seizure activity.  Right rib pain secondary to right rib fracture from a fall more than a month ago -Chest x-ray showed Mildly displaced fracture of the anterolateral right rib fifth rib perhaps marginally more displaced than on prior study.Nondisplaced anterior severe right sixth rib fracture.  -Continue lidocaine patch, tramadol and hydrocodone for pain control.  Patient keeps requesting for morphine.  I remember him from recent hospitalization during which he kept asking were something with "M" and would later ask for morphine continuously.  Will hold off on intravenous narcotics for now.  Acute on chronic systolic CHF with EF of 35 to 40% on echo on 04/03/2020 Acute kidney injury -Strict input and output.  Daily weights.  Fluid restriction.  -Heart failure team following.  Currently on IV Lasix along with isosorbide- hydralazine. -Monitor creatinine.  Creatinine 1.37 today, improving compared to yesterday  Essential hypertension -Blood pressure intermittently elevated.  Antihypertensives plan as above.  Paroxysmal A. Fib -Continue amiodarone.  Aspirin discontinued by neurosurgery for possible need for surgical intervention.  Currently rate controlled  Alcohol abuse -Monitor signs of withdrawal.  Continue CIWA protocol.  Social worker consult.  Reports that he has not been drinking much recently  Chronic HCV with liver cirrhosis -LFTs stable on presentation.  Outpatient follow-up.  No signs of decompensation at this point  Mild leukopenia -Probably from cirrhosis.  Monitor intermittently  Anemia of chronic disease -Probably from cirrhosis.  Hemoglobin stable recently.  Monitor intermittently  Generalized deconditioning -will need PT eval once cleared by neurosurgery  DVT prophylaxis: SCDs.  Subcutaneous heparin  code Status: Full Family  Communication: Spoke to patient at bedside Disposition Plan: Status is: Inpatient  Remains inpatient appropriate because:Inpatient level of care appropriate due to severity of illness   Dispo: The patient is from: Home              Anticipated d/c is to: Home              Anticipated d/c date is: > 3 days              Patient currently is not medically stable to d/c.   Consultants: Neurology/nephrology/neurosurgery  Procedures: EEG on 07/18/2020: No seizures  Antimicrobials: None   Subjective: Patient seen and examined at bedside.  Very poor historian.  Complains of pain all over.  No chest pain or worsening shortness of breath reported.  Did not sleep well last night and wants something to help him sleep.  objective: Vitals:   07/22/20 1257 07/22/20 1612 07/22/20 1944 07/23/20 0411  BP: (!) 131/76 125/78 110/66 111/67  Pulse: 55 78 66 72  Resp:  16 18 18   Temp: 97.8 F (36.6 C) 97.9 F (36.6 C) 98 F (36.7 C) 98.6 F (37 C)  TempSrc: Oral Oral Oral Oral  SpO2: 100% 99% 98% 98%  Weight:    82.2 kg  Height:        Intake/Output Summary (Last 24 hours) at 07/23/2020 0751 Last data filed at 07/22/2020 2039 Gross per 24 hour  Intake 994.4 ml  Output 375 ml  Net 619.4 ml   Filed Weights   07/21/20 0534 07/22/20 0328 07/23/20 0411  Weight: 80.5 kg 80 kg 82.2 kg    Examination:  General exam: No distress.  Extremely poor historian.  Chronically ill looking and looks older than stated age Respiratory system: Bilateral decreased breath sounds at bases with bibasilar crackles, no wheezing  cardiovascular system: S1-S2 heard, rate controlled Gastrointestinal system: Abdomen is nondistended, soft and nontender.  Normal bowel sounds heard  extremities: Lower extremity edema present bilaterally.  No cyanosis   Data Reviewed: I have personally reviewed following labs and imaging studies  CBC: Recent Labs  Lab 07/18/20 1000 07/18/20 1803 07/19/20 1056 07/20/20 1201    WBC 3.2* 3.7* 3.3* 3.3*  HGB 11.6* 11.8* 11.0* 10.7*  HCT 33.3* 33.9* 32.1* 31.8*  MCV 84.9 85.6 84.9 86.2  PLT 239 221 179 786   Basic Metabolic Panel: Recent Labs  Lab 07/19/20 1056 07/19/20 2048 07/20/20 0817 07/20/20 1814 07/21/20 0240 07/22/20 0314 07/23/20 0422  NA 119*   < > 124* 125* 128* 127* 127*  K 4.5  --  4.5  --  4.2 4.1 4.1  CL 89*  --  92*  --  94* 93* 93*  CO2 23  --  24  --  26 27 27   GLUCOSE 109*  --  141*  --  155* 160* 174*  BUN 18  --  20  --  26* 25* 25*  CREATININE 1.31*  --  1.50*  --  1.89* 1.58* 1.37*  CALCIUM 8.2*  --  8.3*  --  8.2* 8.1* 8.3*  MG 1.8  --   --   --  1.8 1.7 2.0   < > = values in this interval not displayed.   GFR: Estimated Creatinine Clearance: 61.1 mL/min (A) (by C-G formula based on SCr of 1.37 mg/dL (H)). Liver Function Tests: Recent Labs  Lab 07/18/20 1803 07/19/20 1056 07/20/20 0817 07/21/20 0240  AST 27 25 25 24   ALT 20 17 17 17   ALKPHOS 50 48 52 55  BILITOT 0.8 1.0 0.6 0.5  PROT 6.0* 5.7* 5.8* 5.6*  ALBUMIN 2.7* 2.5* 2.5* 2.4*   No results for input(s): LIPASE, AMYLASE in the last 168 hours. Recent Labs  Lab 07/18/20 1415  AMMONIA 13   Coagulation Profile: No results for input(s): INR, PROTIME in the last 168 hours. Cardiac Enzymes: No results for input(s): CKTOTAL, CKMB, CKMBINDEX, TROPONINI in the last 168 hours. BNP (last 3 results) No results for input(s): PROBNP in the last 8760 hours. HbA1C: No results for input(s): HGBA1C in the last 72 hours. CBG: Recent Labs  Lab 07/22/20 1257 07/22/20 1350 07/22/20 1625 07/22/20 2047 07/23/20 0652  GLUCAP 61* 93 106* 158* 167*   Lipid Profile: No results for input(s): CHOL, HDL, LDLCALC, TRIG, CHOLHDL, LDLDIRECT in the last 72 hours. Thyroid Function Tests: No results for input(s): TSH, T4TOTAL, FREET4, T3FREE, THYROIDAB in the last 72 hours. Anemia Panel: No results for input(s): VITAMINB12, FOLATE, FERRITIN, TIBC, IRON, RETICCTPCT in the last 72  hours. Sepsis Labs: No results for input(s): PROCALCITON, LATICACIDVEN in the last 168 hours.  Recent Results (from the past 240 hour(s))  SARS Coronavirus 2 by RT PCR (hospital order, performed in [facility] hospital lab) Nasopharyngeal Urine, Clean Catch     Status: None   Collection Time: 07/18/20 12:23 PM   Specimen: Urine, Clean Catch; Nasopharyngeal  Result Value Ref Range Status   SARS Coronavirus 2 NEGATIVE NEGATIVE Final    Comment: (NOTE) SARS-CoV-2 target nucleic acids are NOT DETECTED.  The SARS-CoV-2 RNA is generally detectable in upper and lower respiratory specimens during the acute phase of infection. The lowest concentration of SARS-CoV-2 viral copies this assay can detect is 250 copies / mL. A negative result does not preclude SARS-CoV-2 infection and should not be used as the sole basis for treatment or other patient management decisions.  A negative result may occur with improper specimen collection / handling, submission of specimen other than nasopharyngeal swab, presence of viral mutation(s) within the areas targeted by this assay, and inadequate number of viral copies (<250 copies / mL). A negative result must be combined with clinical observations, patient history, and epidemiological information.  Fact Sheet for Patients:   StrictlyIdeas.no  Fact Sheet for Healthcare Providers: BankingDealers.co.za  This test is not yet approved or  cleared by the [place] FDA and has been authorized for detection and/or diagnosis of SARS-CoV-2 by FDA under an Emergency Use Authorization (EUA).  This EUA will remain in effect (meaning this test can be used) for the duration of the COVID-19 declaration under Section 564(b)(1) of the Act, 21 U.S.C. section 360bbb-3(b)(1), unless the authorization is terminated or revoked sooner.  Performed at [facility], [place] [address] [place], [place] [address]           Radiology Studies: No results found.      Scheduled Meds: . amiodarone  200 mg Oral Daily  . atorvastatin  20 mg Oral q1800  . cyanocobalamin  1,000 mcg Intramuscular Daily  . digoxin  0.125 mg Oral Daily  . folic acid  1 mg Oral Daily  . furosemide  60 mg Intravenous Daily  . gabapentin  600 mg Oral QHS  . heparin  5,000 Units Subcutaneous Q8H  . insulin aspart  0-9 Units Subcutaneous TID WC  . isosorbide-hydrALAZINE  1 tablet Oral TID  . lidocaine  1 patch Transdermal Q24H  .  multivitamin with minerals  1 tablet Oral Daily  . senna-docusate  1 tablet Oral BID  . thiamine  100 mg Oral Daily   Or  . thiamine  100 mg Intravenous Daily   Continuous Infusions:        Aline August, MD [facility] 07/23/2020, 7:51 AM

## 2020-07-23 NOTE — Telephone Encounter (Signed)
Call received from patient.  He explained his anxiety about surgery tomorrow and said that he is considering going to in patient rehab after surgery.  He also said that he took a copy of his SSA award letter and citizenship letter to the [employer]. He said that he was supposed to have an interview with them tomorrow but he will need to reschedule.  He stated that the housing authority representative told him that it would be about a month until he gets an apartment after his interview.

## 2020-07-23 NOTE — Progress Notes (Signed)
   Providing Compassionate, Quality Care - Together  NEUROSURGERY PROGRESS NOTE   S: No issues overnight. Has anxiety about surgery  O: EXAM:  BP 111/67 (BP Location: Left Arm)   Pulse 72   Temp 98.6 F (37 C) (Oral)   Resp 18   Ht 5\' 11"  (1.803 m)   Wt 82.2 kg   SpO2 98%   BMI 25.29 kg/m   Awake, alert, oriented  Speech fluent, appropriate  CNs grossly intact  Poor dentition 4/5 BUE/BLE + hoffmans BL  Hyperreflexia, unable to check achilles reflex due to fused ankle Patellar reflexes 3/4 Has spont spastic BLE movements   ASSESSMENT:  59 y.o. male with  1. 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 C4-C6 severe cord compression with cord signal change  PLAN: - planned ACDF C4-6 on tomorrow, 8/3, appears medically optimized as volume status and Na is improved - pain control - fall risk - hold anticoagulants please - NPO midnight    Thank you for allowing me to participate in this patient's care.  Please do not hesitate to call with questions or concerns.   Elwin Sleight, [facility] Cell: [phone]

## 2020-07-23 NOTE — Progress Notes (Signed)
Admit: 07/18/2020 LOS: 5  60M AoC HFREF NICM, AoC Hyponatremia, cervical stenosis needing decompression. CKD3  Subjective:  . SNa 127- stable  . SCr 1.37  - improved  . Only 375 UOP recorded but missed at least a shift  . Weight had been going down but went up last 24 hours-  On torsemide 20 mg daily only  . He is having some insomnia-  Requests med  08/01 0701 - 08/02 0700 In: 994.4 [P.O.:930; IV Piggyback:64.4] Out: 375 [Urine:375]  Filed Weights   07/21/20 0534 07/22/20 0328 07/23/20 0411  Weight: 80.5 kg 80 kg 82.2 kg    Scheduled Meds: . amiodarone  200 mg Oral Daily  . atorvastatin  20 mg Oral q1800  . digoxin  0.125 mg Oral Daily  . folic acid  1 mg Oral Daily  . gabapentin  600 mg Oral QHS  . heparin  5,000 Units Subcutaneous Q8H  . insulin aspart  0-9 Units Subcutaneous TID WC  . isosorbide-hydrALAZINE  1 tablet Oral TID  . lidocaine  1 patch Transdermal Q24H  . multivitamin with minerals  1 tablet Oral Daily  . senna-docusate  1 tablet Oral BID  . thiamine  100 mg Oral Daily   Or  . thiamine  100 mg Intravenous Daily  . torsemide  20 mg Oral Daily  . zolpidem  5 mg Oral Once   Continuous Infusions: PRN Meds:.acetaminophen **OR** acetaminophen, bisacodyl, diphenhydrAMINE, loratadine, methocarbamol, ondansetron **OR** ondansetron (ZOFRAN) IV, oxyCODONE-acetaminophen, polyethylene glycol, promethazine, senna-docusate, traMADol  Current Labs: reviewed  7/28 UNa 11, UOsm 122 7/28 SOsm 257  Physical Exam:  Blood pressure 111/67, pulse 72, temperature 98.6 F (37 C), temperature source Oral, resp. rate 18, height 5\' 11"  (1.803 m), weight 82.2 kg, SpO2 98 %. NAD 1+LEE RRR  CTAB S/nt/nd Nonfocal, CN2-12 intact  A 1. Hypervolemic Hyponatremia 2/2 #2, improved-   Seems like baseline is in the high 120's 2. AoC HFrEF, diuresing, NICM-  Since crt went down will titrate torsemide back up -  He indicates 60 per day normally-  Will inc to 40 per day  3. Cervical  stenosis for deompression sounds like tomorrow 4. CKD3, mild AKI likely related to hemodynamics/diuretics/tolvaptan- resolved  5. Hx/o ETOH abuse 6. AFib, chronic.   P . Change torsemide to 40 daily  . Would not dose any more tolvaptan as sodium is greater than 125 . Continue 1.2Lfluid restrict . Seems to be at his baseline as far as crt and sodium.  I have increased torsemide and will follow at a distance -  Call with specific questions     Louis Meckel  07/23/2020, 10:18 AM  Recent Labs  Lab 07/21/20 0240 07/22/20 0314 07/23/20 0422  NA 128* 127* 127*  K 4.2 4.1 4.1  CL 94* 93* 93*  CO2 26 27 27   GLUCOSE 155* 160* 174*  BUN 26* 25* 25*  CREATININE 1.89* 1.58* 1.37*  CALCIUM 8.2* 8.1* 8.3*   Recent Labs  Lab 07/18/20 1803 07/19/20 1056 07/20/20 1201  WBC 3.7* 3.3* 3.3*  HGB 11.8* 11.0* 10.7*  HCT 33.9* 32.1* 31.8*  MCV 85.6 84.9 86.2  PLT 221 179 177

## 2020-07-23 NOTE — TOC Initial Note (Signed)
Transition of Care [facility]) - Initial/Assessment Note    Patient Details  Name: Brad [name] MRN: [id number] Date of Birth: May 04, 1961  [facility] CM/SW Contact:    Benard Halsted, LCSW Phone Number: 07/23/2020, 5:01 PM  Clinical Narrative:                 CSW received consult for possible SNF placement at time of discharge. CSW spoke with patient who is very pleasant regarding his request for SNF placement at time of discharge. Patient reported that patient's roommate is currently unhelpful and he is requesting to go to SNF for additional assistance after his procedure. CSW discussed insurance authorization process through his Medicaid and that he may be required to turn over his Medicaid check to the SNF for 30 days. Patient expressed that he would think about it and gave CSW permission to go ahead and send referral out to SNFs to see what is available. No further questions reported at this time. CSW to continue to follow and assist with discharge planning needs.   Expected Discharge Plan: Skilled Nursing Facility Barriers to Discharge: SNF Pending bed offer, Continued Medical Work up   Patient Goals and CMS Choice Patient states their goals for this hospitalization and ongoing recovery are:: Get through surgery CMS Medicare.gov Compare Post Acute Care list provided to:: Patient Choice offered to / list presented to : Patient  Expected Discharge Plan and Services Expected Discharge Plan: [place] In-house Referral: Clinical Social Work   Post Acute Care Choice: [place] Living arrangements for the past 2 months: Apartment Expected Discharge Date:  (unknown)                                    Prior Living Arrangements/Services Living arrangements for the past 2 months: Apartment Lives with:: Roommate Patient language and need for interpreter reviewed:: Yes Do you feel safe going back to the place where you live?: Yes       Need for Family Participation in Patient Care: No (Comment) Care giver support system in place?: No (comment)   Criminal Activity/Legal Involvement Pertinent to Current Situation/Hospitalization: No - Comment as needed  Activities of Daily Living Home Assistive Devices/Equipment: CBG Meter, Cane (specify quad or straight) ADL Screening (condition at time of admission) Patient's cognitive ability adequate to safely complete daily activities?: Yes Is the patient deaf or have difficulty hearing?: No Does the patient have difficulty seeing, even when wearing glasses/contacts?: No Does the patient have difficulty concentrating, remembering, or making decisions?: No Patient able to express need for assistance with ADLs?: Yes Does the patient have difficulty dressing or bathing?: No Independently performs ADLs?: Yes (appropriate for developmental age) Does the patient have difficulty walking or climbing stairs?: No Weakness of Legs: None Weakness of Arms/Hands: None  Permission Sought/Granted Permission sought to share information with : Facility Art therapist granted to share information with : Yes, Verbal Permission Granted     Permission granted to share info w AGENCY: SNFs        Emotional Assessment Appearance:: Appears stated age Attitude/Demeanor/Rapport: Engaged, Charismatic Affect (typically observed): Accepting, Appropriate, Pleasant Orientation: : Oriented to Self, Oriented to Place, Oriented to  Time, Oriented to Situation Alcohol / Substance Use: Not Applicable Psych Involvement: No (comment)  Admission diagnosis:  Hyponatremia [E87.1] Pain [R52] Closed fracture of multiple ribs of right side with routine healing, subsequent encounter [S22.41XD] Patient  Active Problem List   Diagnosis Date Noted  . Cord compression ([place])   . Acute exacerbation of CHF (congestive heart failure) ([place]) 05/29/2020  . Chronic combined systolic and diastolic CHF  (congestive heart failure) ([place]) 04/19/2020  . Alcohol abuse with intoxication ([place]) 03/23/2020  . Hyponatremia 03/23/2020  . Fall 03/23/2020  . Normocytic anemia 03/23/2020  . Leukopenia 03/23/2020  . HTN (hypertension) 03/23/2020  . HLD (hyperlipidemia) 03/23/2020  . AKI (acute kidney injury) ([place]) 03/23/2020  . CHF (congestive heart failure) ([place]) 03/23/2020  . Alcohol abuse with intoxication ([place]) 03/05/2020  . Chronic systolic CHF (congestive heart failure) ([place]) 10/31/2019  . Acute systolic CHF (congestive heart failure) ([place]) 08/02/2019  . Diarrhea 07/29/2019  . Gastroenteritis 07/22/2019  . Hypomagnesemia 06/19/2019  . Chest pain 06/07/2019  . Elevated troponin 05/23/2019  . Tobacco abuse 05/23/2019  . Alcohol abuse 02/21/2019  . Frequent falls 01/17/2019  . Severe mitral regurgitation   . Fall 11/01/2018  . Hyponatremia 10/31/2018  . Chronic atrial fibrillation   . Chronic hyponatremia 08/16/2018  . NSVT (nonsustained ventricular tachycardia) ([place])   . Concussion with loss of consciousness   . Scalp laceration   . Trauma   . Permanent atrial fibrillation   . Hypertensive heart disease   . Shortness of breath   . Pyogenic inflammation of bone ([place])   . Cirrhosis ([place]) 03/23/2018  . Right ankle pain 03/23/2018  . Syncope 08/07/2017  . Acute on chronic combined systolic and diastolic CHF (congestive heart failure) ([place]) 07/09/2017  . Atrial flutter ([place]) 07/09/2017  . Pre-syncope 07/08/2017  . Neuropathy 05/08/2017  . Substance induced mood disorder ([place]) 10/06/2016  . CVA (cerebral vascular accident) ([place]) 09/18/2016  . Left sided numbness   . Homelessness 08/21/2016  . S/P ORIF (open reduction internal fixation) fracture 08/01/2016  . CAD (coronary artery disease), native coronary artery 07/30/2016  . Surgery, elective   . Insomnia 07/22/2016  . NSTEMI (non-ST elevated myocardial infarction) ([place])   . Anemia 07/05/2016  . Thrombocytopenia ([place]) 07/05/2016   . Cocaine abuse ([place]) 07/02/2016  . Chest pain on breathing 07/01/2016  . Essential hypertension 07/01/2016  . DM type 2 (diabetes mellitus, type 2) ([place]) 07/01/2016  . Hypokalemia 07/01/2016  . CKD (chronic kidney disease) stage 3, GFR 30-59 ml/min 07/01/2016  . Painful diabetic neuropathy ([place]) 07/01/2016  . Acute hyponatremia 05/27/2016  . Polysubstance abuse ([place]) 05/27/2016  . Chronic hepatitis C with cirrhosis ([place]) 05/27/2016  . Depression 04/21/2012  . GERD (gastroesophageal reflux disease) 02/16/2012  . History of drug abuse ([place])   . Heroin addiction ([place]) 01/29/2012   PCP:  Charlott Rakes, MD Pharmacy:   [facility], [place] - [address] [place] [address] Phone: [phone] Fax: [phone]     Social Determinants of Health (SDOH) Interventions    Readmission Risk Interventions Readmission Risk Prevention Plan 07/23/2020 06/13/2020 06/10/2020  Transportation Screening Complete Complete Complete  PCP or Specialist Appt within 5-7 Days - - -  Home Care Screening - - -  Medication Review (RN CM) - - -  Medication Review ([place]) Referral to Pharmacy Complete Complete  PCP or Specialist appointment within 3-5 days of discharge Complete Complete (No Data)  PCP/Specialist Appt Not Complete comments - - -  HRI or Home Care Consult Complete Complete -  [place] or Home Care Consult Pt Refusal Comments - - -  SW Recovery Care/Counseling Consult Complete Complete Complete  Palliative Care Screening  Not Applicable Not Applicable Not Applicable  Skilled Nursing Facility Complete Complete Not Applicable  Some recent data might be hidden

## 2020-07-23 NOTE — NC FL2 (Signed)
[place] LEVEL OF CARE SCREENING TOOL     IDENTIFICATION  Patient Name: Brad [name] Birthdate: 09-Jun-1961 Sex: male Admission Date (Current Location): 07/18/2020  [facility] and [place] Number:  Herbalist and Address:  The [place]. [facility], [place] [address] [place], [place] [address]      Provider Number: [id number]  Attending Physician Name and Address:  Aline August, MD  Relative Name and Phone Number:       Current Level of Care: Hospital Recommended Level of Care: [facility] Prior Approval Number:    Date Approved/Denied:   PASRR Number: [id number]  Discharge Plan: SNF    Current Diagnoses: Patient Active Problem List   Diagnosis Date Noted  . Cord compression ([place])   . Acute exacerbation of CHF (congestive heart failure) ([place]) 05/29/2020  . Chronic combined systolic and diastolic CHF (congestive heart failure) ([place]) 04/19/2020  . Alcohol abuse with intoxication ([place]) 03/23/2020  . Hyponatremia 03/23/2020  . Fall 03/23/2020  . Normocytic anemia 03/23/2020  . Leukopenia 03/23/2020  . HTN (hypertension) 03/23/2020  . HLD (hyperlipidemia) 03/23/2020  . AKI (acute kidney injury) ([place]) 03/23/2020  . CHF (congestive heart failure) ([place]) 03/23/2020  . Alcohol abuse with intoxication ([place]) 03/05/2020  . Chronic systolic CHF (congestive heart failure) ([place]) 10/31/2019  . Acute systolic CHF (congestive heart failure) ([place]) 08/02/2019  . Diarrhea 07/29/2019  . Gastroenteritis 07/22/2019  . Hypomagnesemia 06/19/2019  . Chest pain 06/07/2019  . Elevated troponin 05/23/2019  . Tobacco abuse 05/23/2019  . Alcohol abuse 02/21/2019  . Frequent falls 01/17/2019  . Severe mitral regurgitation   . Fall 11/01/2018  . Hyponatremia 10/31/2018  . Chronic atrial fibrillation   . Chronic hyponatremia 08/16/2018  . NSVT (nonsustained ventricular tachycardia) ([place])   . Concussion with loss of consciousness   . Scalp  laceration   . Trauma   . Permanent atrial fibrillation   . Hypertensive heart disease   . Shortness of breath   . Pyogenic inflammation of bone ([place])   . Cirrhosis ([place]) 03/23/2018  . Right ankle pain 03/23/2018  . Syncope 08/07/2017  . Acute on chronic combined systolic and diastolic CHF (congestive heart failure) ([place]) 07/09/2017  . Atrial flutter ([place]) 07/09/2017  . Pre-syncope 07/08/2017  . Neuropathy 05/08/2017  . Substance induced mood disorder ([place]) 10/06/2016  . CVA (cerebral vascular accident) ([place]) 09/18/2016  . Left sided numbness   . Homelessness 08/21/2016  . S/P ORIF (open reduction internal fixation) fracture 08/01/2016  . CAD (coronary artery disease), native coronary artery 07/30/2016  . Surgery, elective   . Insomnia 07/22/2016  . NSTEMI (non-ST elevated myocardial infarction) ([place])   . Anemia 07/05/2016  . Thrombocytopenia ([place]) 07/05/2016  . Cocaine abuse ([place]) 07/02/2016  . Chest pain on breathing 07/01/2016  . Essential hypertension 07/01/2016  . DM type 2 (diabetes mellitus, type 2) ([place]) 07/01/2016  . Hypokalemia 07/01/2016  . CKD (chronic kidney disease) stage 3, GFR 30-59 ml/min 07/01/2016  . Painful diabetic neuropathy ([place]) 07/01/2016  . Acute hyponatremia 05/27/2016  . Polysubstance abuse ([place]) 05/27/2016  . Chronic hepatitis C with cirrhosis ([place]) 05/27/2016  . Depression 04/21/2012  . GERD (gastroesophageal reflux disease) 02/16/2012  . History of drug abuse ([place])   . Heroin addiction ([place]) 01/29/2012    Orientation RESPIRATION BLADDER Height & Weight     Self, Time, Situation, Place  Normal Continent Weight: 181 lb 4.8 oz (82.2 kg) Height:  5\' 11"  (180.3 cm)  BEHAVIORAL SYMPTOMS/MOOD  NEUROLOGICAL BOWEL NUTRITION STATUS      Continent Diet (See DC Summary)  AMBULATORY STATUS COMMUNICATION OF NEEDS Skin   Independent Verbally Normal                       Personal Care Assistance Level of Assistance  Bathing, Feeding, Dressing  Bathing Assistance: Independent Feeding assistance: Independent Dressing Assistance: Independent     Functional Limitations Info  Sight Sight Info: Impaired        SPECIAL CARE FACTORS FREQUENCY  PT (By licensed PT)     PT Frequency: 3X/week              Contractures Contractures Info: Not present    Additional Factors Info  Code Status, Allergies, Psychotropic, Insulin Sliding Scale Code Status Info: Full Allergies Info: Angiotensin Receptor Blockers, Lisinopril, Pamelor Nortriptyline Hcl Psychotropic Info: See DC Summary Insulin Sliding Scale Info: See DC Summary       Current Medications (07/23/2020):  This is the current hospital active medication list Current Facility-Administered Medications  Medication Dose Route Frequency Provider Last Rate Last Admin  . acetaminophen (TYLENOL) tablet 650 mg  650 mg Oral Q6H PRN Kc, Ramesh, MD       Or  . acetaminophen (TYLENOL) suppository 650 mg  650 mg Rectal Q6H PRN Kc, Ramesh, MD      . amiodarone (PACERONE) tablet 200 mg  200 mg Oral Daily Kc, Ramesh, MD   200 mg at 07/23/20 0840  . atorvastatin (LIPITOR) tablet 20 mg  20 mg Oral q1800 Kc, Ramesh, MD   20 mg at 07/23/20 1641  . bisacodyl (DULCOLAX) suppository 10 mg  10 mg Rectal Daily PRN Aline August, MD      . digoxin (LANOXIN) tablet 0.125 mg  0.125 mg Oral Daily Larey Dresser, MD   0.125 mg at 07/23/20 0840  . diphenhydrAMINE (BENADRYL) capsule 25 mg  25 mg Oral Q8H PRN Viona Gilmore D, NP   25 mg at 07/22/20 1218  . folic acid (FOLVITE) tablet 1 mg  1 mg Oral Daily Kc, Ramesh, MD   1 mg at 07/23/20 0841  . gabapentin (NEURONTIN) capsule 600 mg  600 mg Oral QHS Kc, Ramesh, MD   600 mg at 07/22/20 2057  . heparin injection 5,000 Units  5,000 Units Subcutaneous Q8H Dawley, Troy C, DO   5,000 Units at 07/23/20 1435  . insulin aspart (novoLOG) injection 0-9 Units  0-9 Units Subcutaneous TID WC Antonieta Pert, MD   3 Units at 07/23/20 1642  . isosorbide-hydrALAZINE  (BIDIL) 20-37.5 MG per tablet 1 tablet  1 tablet Oral TID Larey Dresser, MD   1 tablet at 07/23/20 1642  . lidocaine (LIDODERM) 5 % 1 patch  1 patch Transdermal Q24H Kc, Ramesh, MD   1 patch at 07/20/20 1700  . loratadine (CLARITIN) tablet 10 mg  10 mg Oral Daily PRN Aline August, MD   10 mg at 07/21/20 1219  . methocarbamol (ROBAXIN) tablet 500 mg  500 mg Oral Q6H PRN Antonieta Pert, MD   500 mg at 07/23/20 0851  . multivitamin with minerals tablet 1 tablet  1 tablet Oral Daily Antonieta Pert, MD   1 tablet at 07/23/20 0840  . ondansetron (ZOFRAN) tablet 4 mg  4 mg Oral Q6H PRN Kc, Ramesh, MD       Or  . ondansetron (ZOFRAN) injection 4 mg  4 mg Intravenous Q6H PRN Antonieta Pert, MD   4 mg at 07/21/20  0801  . oxyCODONE-acetaminophen (PERCOCET/ROXICET) 5-325 MG per tablet 1 tablet  1 tablet Oral Q4H PRN Viona Gilmore D, NP   1 tablet at 07/23/20 1642  . polyethylene glycol (MIRALAX / GLYCOLAX) packet 17 g  17 g Oral Daily PRN Aline August, MD   17 g at 07/23/20 0841  . promethazine (PHENERGAN) injection 6.25 mg  6.25 mg Intravenous Q6H PRN Kc, Ramesh, MD      . senna-docusate (Senokot-S) tablet 1 tablet  1 tablet Oral QHS PRN Kc, Ramesh, MD      . senna-docusate (Senokot-S) tablet 1 tablet  1 tablet Oral BID Aline August, MD   1 tablet at 07/23/20 0840  . thiamine tablet 100 mg  100 mg Oral Daily Kc, Ramesh, MD   100 mg at 07/23/20 0840   Or  . thiamine (B-1) injection 100 mg  100 mg Intravenous Daily Kc, Ramesh, MD   100 mg at 07/21/20 0801  . [START ON 07/24/2020] torsemide (DEMADEX) tablet 40 mg  40 mg Oral Daily Corliss Parish, MD      . traMADol Veatrice [place]) tablet 50 mg  50 mg Oral Q8H PRN Antonieta Pert, MD   50 mg at 07/23/20 0851  . zolpidem (AMBIEN) tablet 5 mg  5 mg Oral Once Clegg, Amy D, NP         Discharge Medications: Please see discharge summary for a list of discharge medications.  Relevant Imaging Results:  Relevant Lab Results:   Additional Information ssn:  [id number]  Loreta Ave, LCSWA

## 2020-07-24 ENCOUNTER — Inpatient Hospital Stay (HOSPITAL_COMMUNITY): Payer: Medicaid Other

## 2020-07-24 ENCOUNTER — Inpatient Hospital Stay (HOSPITAL_COMMUNITY): Payer: Medicaid Other | Admitting: Certified Registered Nurse Anesthetist

## 2020-07-24 ENCOUNTER — Encounter (HOSPITAL_COMMUNITY)
Admission: EM | Disposition: A | Discharge status: Home / Self Care | Payer: Self-pay | Source: Home / Self Care | Attending: Internal Medicine

## 2020-07-24 ENCOUNTER — Encounter (HOSPITAL_COMMUNITY): Payer: Self-pay | Admitting: Internal Medicine

## 2020-07-24 HISTORY — PX: ANTERIOR CERVICAL DECOMP/DISCECTOMY FUSION: SHX1161

## 2020-07-24 LAB — TYPE AND SCREEN
ABO/RH(D): B POS
Antibody Screen: NEGATIVE

## 2020-07-24 LAB — GLUCOSE, CAPILLARY
Glucose-Capillary: 138 mg/dL — ABNORMAL HIGH (ref 70–99)
Glucose-Capillary: 138 mg/dL — ABNORMAL HIGH (ref 70–99)
Glucose-Capillary: 127 mg/dL — ABNORMAL HIGH (ref 70–99)
Glucose-Capillary: 143 mg/dL — ABNORMAL HIGH (ref 70–99)
Glucose-Capillary: 171 mg/dL — ABNORMAL HIGH (ref 70–99)
Glucose-Capillary: 309 mg/dL — ABNORMAL HIGH (ref 70–99)

## 2020-07-24 LAB — CREATININE, SERUM
Creatinine, Ser: 1.64 mg/dL — ABNORMAL HIGH (ref 0.61–1.24)
GFR calc Af Amer: 52 mL/min — ABNORMAL LOW (ref >60)
GFR calc non Af Amer: 45 mL/min — ABNORMAL LOW (ref >60)

## 2020-07-24 LAB — CBC WITH DIFFERENTIAL/PLATELET
Abs Immature Granulocytes: 0.02 10*3/uL (ref 0.00–0.07)
Basophils Absolute: 0 10*3/uL (ref 0.0–0.1)
Basophils Relative: 1 %
Eosinophils Absolute: 0.2 10*3/uL (ref 0.0–0.5)
Eosinophils Relative: 5 %
HCT: 29.2 % — ABNORMAL LOW (ref 39.0–52.0)
Hemoglobin: 9.6 g/dL — ABNORMAL LOW (ref 13.0–17.0)
Immature Granulocytes: 1 %
Lymphocytes Relative: 7 %
Lymphs Abs: 0.3 10*3/uL — ABNORMAL LOW (ref 0.7–4.0)
MCH: 29 pg (ref 26.0–34.0)
MCHC: 32.9 g/dL (ref 30.0–36.0)
MCV: 88.2 fL (ref 80.0–100.0)
Monocytes Absolute: 0.8 10*3/uL (ref 0.1–1.0)
Monocytes Relative: 20 %
Neutro Abs: 2.6 10*3/uL (ref 1.7–7.7)
Neutrophils Relative %: 66 %
Platelets: 156 10*3/uL (ref 150–400)
RBC: 3.31 MIL/uL — ABNORMAL LOW (ref 4.22–5.81)
RDW: 14.3 % (ref 11.5–15.5)
WBC: 3.9 10*3/uL — ABNORMAL LOW (ref 4.0–10.5)
nRBC: 0 % (ref 0.0–0.2)

## 2020-07-24 LAB — BASIC METABOLIC PANEL
Anion gap: 7 (ref 5–15)
Anion gap: 6 (ref 5–15)
BUN: 29 mg/dL — ABNORMAL HIGH (ref 6–20)
BUN: 28 mg/dL — ABNORMAL HIGH (ref 6–20)
CO2: 26 mmol/L (ref 22–32)
CO2: 27 mmol/L (ref 22–32)
Calcium: 8.3 mg/dL — ABNORMAL LOW (ref 8.9–10.3)
Calcium: 8.3 mg/dL — ABNORMAL LOW (ref 8.9–10.3)
Chloride: 92 mmol/L — ABNORMAL LOW (ref 98–111)
Chloride: 93 mmol/L — ABNORMAL LOW (ref 98–111)
Creatinine, Ser: 1.48 mg/dL — ABNORMAL HIGH (ref 0.61–1.24)
Creatinine, Ser: 1.44 mg/dL — ABNORMAL HIGH (ref 0.61–1.24)
GFR calc Af Amer: 59 mL/min — ABNORMAL LOW (ref >60)
GFR calc Af Amer: >60 mL/min (ref >60)
GFR calc non Af Amer: 51 mL/min — ABNORMAL LOW (ref >60)
GFR calc non Af Amer: 52 mL/min — ABNORMAL LOW (ref >60)
Glucose, Bld: 147 mg/dL — ABNORMAL HIGH (ref 70–99)
Glucose, Bld: 130 mg/dL — ABNORMAL HIGH (ref 70–99)
Potassium: 4.4 mmol/L (ref 3.5–5.1)
Potassium: 4.3 mmol/L (ref 3.5–5.1)
Sodium: 125 mmol/L — ABNORMAL LOW (ref 135–145)
Sodium: 126 mmol/L — ABNORMAL LOW (ref 135–145)

## 2020-07-24 LAB — CBC
HCT: 36.6 % — ABNORMAL LOW (ref 39.0–52.0)
Hemoglobin: 11.9 g/dL — ABNORMAL LOW (ref 13.0–17.0)
MCH: 29 pg (ref 26.0–34.0)
MCHC: 32.5 g/dL (ref 30.0–36.0)
MCV: 89.1 fL (ref 80.0–100.0)
Platelets: 203 10*3/uL (ref 150–400)
RBC: 4.11 MIL/uL — ABNORMAL LOW (ref 4.22–5.81)
RDW: 14.7 % (ref 11.5–15.5)
WBC: 5.1 10*3/uL (ref 4.0–10.5)
nRBC: 0 % (ref 0.0–0.2)

## 2020-07-24 LAB — SODIUM: Sodium: 130 mmol/L — ABNORMAL LOW (ref 135–145)

## 2020-07-24 LAB — PROTIME-INR
INR: 1.1 (ref 0.8–1.2)
Prothrombin Time: 13.9 seconds (ref 11.4–15.2)

## 2020-07-24 LAB — MAGNESIUM: Magnesium: 1.8 mg/dL (ref 1.7–2.4)

## 2020-07-24 LAB — APTT: aPTT: 31 seconds (ref 24–36)

## 2020-07-24 SURGERY — ANTERIOR CERVICAL DECOMPRESSION/DISCECTOMY FUSION 2 LEVELS
Anesthesia: General

## 2020-07-24 MED ORDER — HYDROMORPHONE HCL 1 MG/ML IJ SOLN
1.0000 mg | INTRAMUSCULAR | Status: DC | PRN
  Administered 2020-07-24 – 2020-07-27 (×14): 1 mg via INTRAVENOUS
  Filled 2020-07-24 (×15): qty 1

## 2020-07-24 MED ORDER — DEXAMETHASONE SODIUM PHOSPHATE 10 MG/ML IJ SOLN
INTRAMUSCULAR | Status: DC | PRN
  Administered 2020-07-24: 10 mg via INTRAVENOUS

## 2020-07-24 MED ORDER — INSULIN ASPART 100 UNIT/ML ~~LOC~~ SOLN
4.0000 [IU] | Freq: Once | SUBCUTANEOUS | Status: AC
  Administered 2020-07-24: 4 [IU] via SUBCUTANEOUS

## 2020-07-24 MED ORDER — SUGAMMADEX SODIUM 200 MG/2ML IV SOLN
INTRAVENOUS | Status: DC | PRN
  Administered 2020-07-24: 200 mg via INTRAVENOUS

## 2020-07-24 MED ORDER — DEXAMETHASONE SODIUM PHOSPHATE 10 MG/ML IJ SOLN
INTRAMUSCULAR | Status: AC
  Filled 2020-07-24: qty 1

## 2020-07-24 MED ORDER — FENTANYL CITRATE (PF) 250 MCG/5ML IJ SOLN
INTRAMUSCULAR | Status: AC
  Filled 2020-07-24: qty 5

## 2020-07-24 MED ORDER — ACETAMINOPHEN 325 MG PO TABS: 650.0000 mg | ORAL_TABLET | ORAL | Status: DC | PRN

## 2020-07-24 MED ORDER — SODIUM CHLORIDE 0.9% FLUSH
3.0000 mL | Freq: Two times a day (BID) | INTRAVENOUS | Status: DC
  Administered 2020-07-24 – 2020-07-27 (×6): 3 mL via INTRAVENOUS

## 2020-07-24 MED ORDER — ONDANSETRON HCL 4 MG/2ML IJ SOLN
INTRAMUSCULAR | Status: DC | PRN
  Administered 2020-07-24: 4 mg via INTRAVENOUS

## 2020-07-24 MED ORDER — SODIUM CHLORIDE 0.9% FLUSH: 3.0000 mL | INTRAVENOUS | Status: DC | PRN

## 2020-07-24 MED ORDER — THROMBIN 5000 UNITS EX SOLR
OROMUCOSAL | Status: DC | PRN
  Administered 2020-07-24: 5 mL via TOPICAL

## 2020-07-24 MED ORDER — OXYCODONE-ACETAMINOPHEN 7.5-325 MG PO TABS
1.0000 | ORAL_TABLET | ORAL | Status: DC | PRN
  Administered 2020-07-24 – 2020-07-27 (×12): 1 via ORAL
  Filled 2020-07-24 (×12): qty 1

## 2020-07-24 MED ORDER — METHOCARBAMOL 1000 MG/10ML IJ SOLN
500.0000 mg | Freq: Four times a day (QID) | INTRAVENOUS | Status: DC | PRN
  Filled 2020-07-24: qty 5

## 2020-07-24 MED ORDER — ONDANSETRON HCL 4 MG/2ML IJ SOLN: 4.0000 mg | Freq: Once | INTRAMUSCULAR | Status: DC | PRN

## 2020-07-24 MED ORDER — FENTANYL CITRATE (PF) 100 MCG/2ML IJ SOLN
INTRAMUSCULAR | Status: DC | PRN
  Administered 2020-07-24 (×7): 50 ug via INTRAVENOUS

## 2020-07-24 MED ORDER — ROCURONIUM BROMIDE 10 MG/ML (PF) SYRINGE
PREFILLED_SYRINGE | INTRAVENOUS | Status: AC
  Filled 2020-07-24: qty 10

## 2020-07-24 MED ORDER — MIDAZOLAM HCL 5 MG/5ML IJ SOLN
INTRAMUSCULAR | Status: DC | PRN
  Administered 2020-07-24: 2 mg via INTRAVENOUS

## 2020-07-24 MED ORDER — MENTHOL 3 MG MT LOZG
1.0000 | LOZENGE | OROMUCOSAL | Status: DC | PRN
  Filled 2020-07-24: qty 9

## 2020-07-24 MED ORDER — HEPARIN SODIUM (PORCINE) 5000 UNIT/ML IJ SOLN
5000.0000 [IU] | Freq: Two times a day (BID) | INTRAMUSCULAR | Status: DC
  Administered 2020-07-25 – 2020-07-27 (×5): 5000 [IU] via SUBCUTANEOUS
  Filled 2020-07-24 (×5): qty 1

## 2020-07-24 MED ORDER — SODIUM CHLORIDE 0.9 % IV SOLN
INTRAVENOUS | Status: DC | PRN
  Administered 2020-07-24: 500 mL

## 2020-07-24 MED ORDER — SODIUM CHLORIDE 0.9 % IV SOLN
250.0000 mL | INTRAVENOUS | Status: DC
  Administered 2020-07-24: 250 mL via INTRAVENOUS

## 2020-07-24 MED ORDER — CEFAZOLIN SODIUM-DEXTROSE 2-4 GM/100ML-% IV SOLN
2.0000 g | Freq: Three times a day (TID) | INTRAVENOUS | Status: AC
  Administered 2020-07-24 – 2020-07-25 (×2): 2 g via INTRAVENOUS
  Filled 2020-07-24 (×2): qty 100

## 2020-07-24 MED ORDER — GLYCOPYRROLATE 0.2 MG/ML IJ SOLN
INTRAMUSCULAR | Status: DC | PRN
  Administered 2020-07-24: .2 mg via INTRAVENOUS

## 2020-07-24 MED ORDER — TRIAMCINOLONE ACETONIDE 40 MG/ML IJ SUSP
INTRAMUSCULAR | Status: DC | PRN
  Administered 2020-07-24: 40 mg via INTRAMUSCULAR

## 2020-07-24 MED ORDER — TOLVAPTAN 15 MG PO TABS
15.0000 mg | ORAL_TABLET | ORAL | Status: AC
  Administered 2020-07-24: 15 mg via ORAL
  Filled 2020-07-24: qty 1

## 2020-07-24 MED ORDER — MIDAZOLAM HCL 2 MG/2ML IJ SOLN
INTRAMUSCULAR | Status: AC
  Filled 2020-07-24: qty 2

## 2020-07-24 MED ORDER — ONDANSETRON HCL 4 MG/2ML IJ SOLN
INTRAMUSCULAR | Status: AC
  Filled 2020-07-24: qty 2

## 2020-07-24 MED ORDER — HYDROMORPHONE HCL 1 MG/ML IJ SOLN
INTRAMUSCULAR | Status: AC
  Filled 2020-07-24: qty 1

## 2020-07-24 MED ORDER — THROMBIN 5000 UNITS EX SOLR
CUTANEOUS | Status: DC | PRN
  Administered 2020-07-24 (×2): 5000 [IU] via TOPICAL

## 2020-07-24 MED ORDER — LIDOCAINE 2% (20 MG/ML) 5 ML SYRINGE
INTRAMUSCULAR | Status: DC | PRN
  Administered 2020-07-24: 60 mg via INTRAVENOUS

## 2020-07-24 MED ORDER — HYDROMORPHONE HCL 1 MG/ML IJ SOLN
0.2500 mg | INTRAMUSCULAR | Status: DC | PRN
  Administered 2020-07-24 (×3): 0.5 mg via INTRAVENOUS

## 2020-07-24 MED ORDER — PHENYLEPHRINE HCL-NACL 10-0.9 MG/250ML-% IV SOLN
INTRAVENOUS | Status: DC | PRN
Start: 2020-07-24 — End: 2020-07-24
  Administered 2020-07-24: 25 ug/min via INTRAVENOUS

## 2020-07-24 MED ORDER — LIDOCAINE 2% (20 MG/ML) 5 ML SYRINGE
INTRAMUSCULAR | Status: AC
  Filled 2020-07-24: qty 5

## 2020-07-24 MED ORDER — ACETAMINOPHEN 650 MG RE SUPP: 650.0000 mg | RECTAL | Status: DC | PRN

## 2020-07-24 MED ORDER — 0.9 % SODIUM CHLORIDE (POUR BTL) OPTIME
TOPICAL | Status: DC | PRN
  Administered 2020-07-24: 1000 mL

## 2020-07-24 MED ORDER — THROMBIN 5000 UNITS EX SOLR
CUTANEOUS | Status: AC
  Filled 2020-07-24: qty 5000

## 2020-07-24 MED ORDER — PHENOL 1.4 % MT LIQD: 1.0000 | OROMUCOSAL | Status: DC | PRN

## 2020-07-24 MED ORDER — THROMBIN 5000 UNITS EX SOLR
CUTANEOUS | Status: AC
  Filled 2020-07-24: qty 10000

## 2020-07-24 MED ORDER — METHOCARBAMOL 500 MG PO TABS
500.0000 mg | ORAL_TABLET | Freq: Four times a day (QID) | ORAL | Status: DC | PRN
  Administered 2020-07-24 – 2020-07-26 (×3): 500 mg via ORAL
  Filled 2020-07-24 (×3): qty 1

## 2020-07-24 MED ORDER — PROPOFOL 10 MG/ML IV BOLUS
INTRAVENOUS | Status: AC
  Filled 2020-07-24: qty 20

## 2020-07-24 MED ORDER — PROPOFOL 10 MG/ML IV BOLUS
INTRAVENOUS | Status: DC | PRN
  Administered 2020-07-24: 100 mg via INTRAVENOUS

## 2020-07-24 MED ORDER — CHLORHEXIDINE GLUCONATE 0.12 % MT SOLN
15.0000 mL | Freq: Once | OROMUCOSAL | Status: AC
  Filled 2020-07-24: qty 15

## 2020-07-24 MED ORDER — FUROSEMIDE 10 MG/ML IJ SOLN
80.0000 mg | Freq: Two times a day (BID) | INTRAMUSCULAR | Status: AC
  Administered 2020-07-24 (×2): 80 mg via INTRAVENOUS
  Filled 2020-07-24 (×2): qty 8

## 2020-07-24 MED ORDER — HEMOSTATIC AGENTS (NO CHARGE) OPTIME
TOPICAL | Status: DC | PRN
  Administered 2020-07-24: 1 via TOPICAL

## 2020-07-24 MED ORDER — OXYCODONE HCL 5 MG PO TABS: 5.0000 mg | ORAL_TABLET | Freq: Once | ORAL | Status: DC | PRN

## 2020-07-24 MED ORDER — OXYCODONE HCL 5 MG/5ML PO SOLN: 5.0000 mg | Freq: Once | ORAL | Status: DC | PRN

## 2020-07-24 MED ORDER — LACTATED RINGERS IV SOLN: INTRAVENOUS | Status: DC | PRN

## 2020-07-24 MED ORDER — CHLORHEXIDINE GLUCONATE 0.12 % MT SOLN
OROMUCOSAL | Status: AC
  Administered 2020-07-24: 15 mL via OROMUCOSAL
  Filled 2020-07-24: qty 15

## 2020-07-24 MED ORDER — TRIAMCINOLONE ACETONIDE 40 MG/ML IJ SUSP
INTRAMUSCULAR | Status: AC
  Filled 2020-07-24: qty 5

## 2020-07-24 MED ORDER — ROCURONIUM BROMIDE 10 MG/ML (PF) SYRINGE
PREFILLED_SYRINGE | INTRAVENOUS | Status: DC | PRN
  Administered 2020-07-24: 20 mg via INTRAVENOUS
  Administered 2020-07-24: 60 mg via INTRAVENOUS
  Administered 2020-07-24: 20 mg via INTRAVENOUS

## 2020-07-24 SURGICAL SUPPLY — 69 items
APL SKNCLS STERI-STRIP NONHPOA (GAUZE/BANDAGES/DRESSINGS) ×2
BAG DECANTER FOR FLEXI CONT (MISCELLANEOUS) ×2 IMPLANT
BAND INSRT 18 STRL LF DISP RB (MISCELLANEOUS) ×2
BAND RUBBER #18 3X1/16 STRL (MISCELLANEOUS) ×4 IMPLANT
BENZOIN TINCTURE PRP APPL 2/3 (GAUZE/BANDAGES/DRESSINGS) ×4 IMPLANT
BIT DRILL NEURO 2X3.1 SFT TUCH (MISCELLANEOUS) ×1 IMPLANT
BLADE CLIPPER SURG (BLADE) IMPLANT
BUR CARBIDE MATCH 3.0 (BURR) ×2 IMPLANT
CANISTER SUCT 3000ML PPV (MISCELLANEOUS) ×2 IMPLANT
CARTRIDGE OIL MAESTRO DRILL (MISCELLANEOUS) ×1 IMPLANT
COVER MAYO STAND STRL (DRAPES) ×4 IMPLANT
COVER WAND RF STERILE (DRAPES) ×2 IMPLANT
DECANTER SPIKE VIAL GLASS SM (MISCELLANEOUS) ×2 IMPLANT
DIFFUSER DRILL AIR PNEUMATIC (MISCELLANEOUS) ×2 IMPLANT
DRAPE C-ARM 42X72 X-RAY (DRAPES) ×2 IMPLANT
DRAPE HALF SHEET 40X57 (DRAPES) IMPLANT
DRAPE LAPAROTOMY 100X72 PEDS (DRAPES) ×2 IMPLANT
DRAPE MICROSCOPE LEICA (MISCELLANEOUS) ×2 IMPLANT
DRILL NEURO 2X3.1 SOFT TOUCH (MISCELLANEOUS) ×2
DURAPREP 6ML APPLICATOR 50/CS (WOUND CARE) ×2 IMPLANT
ELECT COATED BLADE 2.86 ST (ELECTRODE) ×2 IMPLANT
ELECT REM PT RETURN 9FT ADLT (ELECTROSURGICAL) ×2
ELECTRODE REM PT RTRN 9FT ADLT (ELECTROSURGICAL) ×1 IMPLANT
EVACUATOR 1/8 PVC DRAIN (DRAIN) ×2 IMPLANT
FEE INTRAOP MONITOR IMPULS NCS (MISCELLANEOUS) ×1 IMPLANT
GAUZE 4X4 16PLY RFD (DISPOSABLE) IMPLANT
GLOVE BIOGEL PI IND STRL 8 (GLOVE) ×1 IMPLANT
GLOVE BIOGEL PI INDICATOR 8 (GLOVE) ×1
GLOVE ECLIPSE 7.0 STRL STRAW (GLOVE) ×2 IMPLANT
GLOVE ECLIPSE 8.0 STRL XLNG CF (GLOVE) ×4 IMPLANT
GLOVE EXAM NITRILE LRG STRL (GLOVE) IMPLANT
GLOVE EXAM NITRILE XL STR (GLOVE) IMPLANT
GLOVE EXAM NITRILE XS STR PU (GLOVE) IMPLANT
GLOVE SURG SS PI 7.5 STRL IVOR (GLOVE) ×6 IMPLANT
GOWN STRL REUS W/ TWL LRG LVL3 (GOWN DISPOSABLE) ×1 IMPLANT
GOWN STRL REUS W/ TWL XL LVL3 (GOWN DISPOSABLE) ×1 IMPLANT
GOWN STRL REUS W/TWL 2XL LVL3 (GOWN DISPOSABLE) IMPLANT
GOWN STRL REUS W/TWL LRG LVL3 (GOWN DISPOSABLE) ×2
GOWN STRL REUS W/TWL XL LVL3 (GOWN DISPOSABLE) ×2
HEMOSTAT POWDER KIT SURGIFOAM (HEMOSTASIS) ×2 IMPLANT
INTRAOP MONITOR FEE IMPULS NCS (MISCELLANEOUS) ×1
INTRAOP MONITOR FEE IMPULSE (MISCELLANEOUS) ×1
KIT BASIN OR (CUSTOM PROCEDURE TRAY) ×2 IMPLANT
KIT TURNOVER KIT B (KITS) ×2 IMPLANT
NEEDLE HYPO 22GX1.5 SAFETY (NEEDLE) ×2 IMPLANT
NEEDLE SPNL 18GX3.5 QUINCKE PK (NEEDLE) ×2 IMPLANT
NS IRRIG 1000ML POUR BTL (IV SOLUTION) ×2 IMPLANT
OIL CARTRIDGE MAESTRO DRILL (MISCELLANEOUS) ×2
PACK LAMINECTOMY NEURO (CUSTOM PROCEDURE TRAY) ×2 IMPLANT
PAD ARMBOARD 7.5X6 YLW CONV (MISCELLANEOUS) ×6 IMPLANT
PIN DISTRACTION 14MM (PIN) ×4 IMPLANT
PLATE CERV OZARK 2LVL 40 (Plate) ×2 IMPLANT
PUTTY DBX 2.5CC (Putty) ×2 IMPLANT
PUTTY DBX 2.5CC DEPUY (Putty) ×1 IMPLANT
SCREW FIXED ST OZARK 4X16 (Screw) ×4 IMPLANT
SCREW VA ST OZARK 4X14 (Screw) ×4 IMPLANT
SCREW VA ST OZARK 4X14 SRT (Screw) ×2 IMPLANT
SCREW VA ST OZARK 4X16 (Plate) ×4 IMPLANT
SPACER ANGLD CASCAD 16X13X7 7D (Spacer) ×4 IMPLANT
SPONGE INTESTINAL PEANUT (DISPOSABLE) ×2 IMPLANT
SPONGE SURGIFOAM ABS GEL 12-7 (HEMOSTASIS) IMPLANT
SPONGE SURGIFOAM ABS GEL SZ50 (HEMOSTASIS) ×2 IMPLANT
STAPLER VISISTAT 35W (STAPLE) IMPLANT
STRIP CLOSURE SKIN 1/2X4 (GAUZE/BANDAGES/DRESSINGS) ×2 IMPLANT
TAPE SURG TRANSPORE 1 IN (GAUZE/BANDAGES/DRESSINGS) ×1 IMPLANT
TAPE SURGICAL TRANSPORE 1 IN (GAUZE/BANDAGES/DRESSINGS) ×1
TOWEL GREEN STERILE (TOWEL DISPOSABLE) ×2 IMPLANT
TOWEL GREEN STERILE FF (TOWEL DISPOSABLE) ×2 IMPLANT
WATER STERILE IRR 1000ML POUR (IV SOLUTION) ×2 IMPLANT

## 2020-07-24 NOTE — Transfer of Care (Signed)
Immediate Anesthesia Transfer of Care Note  Patient: Brad [name]  Procedure(s) Performed: ANTERIOR CERVICAL DECOMPRESSION/DISCECTOMY FUSION CERVICAL FOUR-CERVICAL FIVE, CERVICAL FIVE- CERVICAL SIX (N/A )  Patient Location: PACU  Anesthesia Type:General  Level of Consciousness: awake and patient cooperative  Airway & Oxygen Therapy: Patient Spontanous Breathing and Patient connected to face mask oxygen  Post-op Assessment: Report given to RN, Post -op Vital signs reviewed and stable and Patient moving all extremities X 4  Post vital signs: Reviewed and stable  Last Vitals:  Vitals Value Taken Time  BP 121/73 07/24/20 1429  Temp    Pulse 89 07/24/20 1432  Resp 21 07/24/20 1432  SpO2 100 % 07/24/20 1432  Vitals shown include unvalidated device data.  Last Pain:  Vitals:   07/24/20 0800  TempSrc:   PainSc: Asleep      Patients Stated Pain Goal: 5 (44/31/54 0086)  Complications: No complications documented.

## 2020-07-24 NOTE — Progress Notes (Signed)
Patient ID: Brad [name], male   DOB: [birth year], 59 y.o.   MRN: [id number]  PROGRESS NOTE    Othniel Maret  ION:[id number] DOB: 06-27-1961 DOA: 07/18/2020 PCP: Charlott Rakes, MD   Brief Narrative:  59 year old male with history of chronic systolic heart failure with EF of 35 to 40% on echo on 04/03/2020, hypertension, chronic hyponatremia, alcohol abuse, type 2 diabetes mellitus, history of hepatitis C virus with liver cirrhosis, depression, recent multiple hospitalizations with rib pain/chest pain hyponatremia presented with severe right rib pain along with continued jerking of the right leg and other body parts as well.  In the ED, sodium was 116, he was started on IV fluids.  Nephrology was consulted.  Chest x-ray showed mildly displaced fracture of the anterolateral right fifth rib and nondisplaced anterior right severe right sixth rib fracture.  Neurology was consulted for?  Myoclonic jerking.  EEG did not show any seizure.  MRI of the cervical spine showed severe spinal stenosis at C4-C5 C5-C6 levels with moderate cord mass-effect.  Neurosurgery was consulted who recommended that patient be transferred to [place]:   Acute on chronic hyponatremia -Presented with a sodium level of 116.  Recent admission with similar hyponatremia with history of chronic hyponatremia and upon discharge, sodium level was 129 after a fluid restriction and IV Lasix.  He had also received a dose of tolvaptan at one point -Because of fluid overload -Nephrology/cardiology following.  Sodium level 125 this morning.  Continue fluid restriction. -Received a dose of tolvaptan on 07/19/2020 and on 07/20/2020.  Currently on oral torsemide as per cardiology.  Severe cervical stenosis at C4-C5 and C5-C6 levels with moderate cord mass-effect Abnormal jerking movements of extremities -MRI of the cervical spine with findings as above.  Neurosurgery following.  For probable surgical  intervention today. -Still having some jerky movements of right leg.  Neurology evaluation appreciated: Neurology has signed off.  EEG did not show any seizure activity.  Right rib pain secondary to right rib fracture from a fall more than a month ago -Chest x-ray showed Mildly displaced fracture of the anterolateral right rib fifth rib perhaps marginally more displaced than on prior study.Nondisplaced anterior severe right sixth rib fracture.  -Continue lidocaine patch, tramadol and hydrocodone for pain control.  Patient keeps requesting for morphine.  I remember him from recent hospitalization during which he kept asking were something with "M" and would later ask for morphine continuously.  Will hold off on intravenous narcotics for now.  Acute on chronic systolic CHF with EF of 35 to 40% on echo on 04/03/2020 Acute kidney injury -Strict input and output.  Daily weights.  Fluid restriction.  -Heart failure team following.  Continue oral torsemide along with isosorbide- hydralazine. -Monitor creatinine.  Creatinine 1.48 today  Essential hypertension -Blood pressure stable.  Antihypertensives plan as above.  Paroxysmal A. Fib -Continue amiodarone.  Aspirin discontinued by neurosurgery for possible need for surgical intervention.  Currently rate controlled  Alcohol abuse -Monitor signs of withdrawal.  Continue CIWA protocol.  Social worker consult.  Reports that he has not been drinking much recently  Chronic HCV with liver cirrhosis -LFTs stable on presentation.  Outpatient follow-up.  No signs of decompensation at this point  Mild leukopenia -Probably from cirrhosis.  Monitor intermittently  Anemia of chronic disease -Probably from cirrhosis.  Hemoglobin stable recently.  Monitor intermittently  Generalized deconditioning -will need PT eval once cleared by neurosurgery -Patient requested SNF placement after surgery.  Social worker  consulted    DVT prophylaxis: SCDs.   Subcutaneous heparin held for surgery code Status: Full Family Communication: Spoke to patient at bedside Disposition Plan: Status is: Inpatient  Remains inpatient appropriate because:Inpatient level of care appropriate due to severity of illness   Dispo: The patient is from: Home              Anticipated d/c is to: Home              Anticipated d/c date is: > 3 days              Patient currently is not medically stable to d/c.   Consultants: Neurology/nephrology/neurosurgery  Procedures: EEG on 07/18/2020: No seizures  Antimicrobials: None   Subjective: Patient seen and examined at bedside.  Extremely poor historian.  No overnight fever, shortness of breath, chest pain reported.     Objective: Vitals:   07/23/20 1432 07/23/20 2004 07/24/20 0000 07/24/20 0556  BP: 129/83 124/75 91/64 124/70  Pulse: 70 68 73 82  Resp: 18 18 17 18   Temp: 97.8 F (36.6 C) 97.9 F (36.6 C) 98 F (36.7 C) 98.2 F (36.8 C)  TempSrc: Oral Oral Oral Oral  SpO2: 99% 98% 99% 99%  Weight:    82.9 kg  Height:        Intake/Output Summary (Last 24 hours) at 07/24/2020 0750 Last data filed at 07/24/2020 0500 Gross per 24 hour  Intake 1198 ml  Output 450 ml  Net 748 ml   Filed Weights   07/22/20 0328 07/23/20 0411 07/24/20 0556  Weight: 80 kg 82.2 kg 82.9 kg    Examination:  General exam: No acute distress.  Extremely poor historian.  Chronically ill looking and looks older than stated age Respiratory system: Bilateral decreased breath sounds at bases with basilar crackles cardiovascular system: Rate controlled, S1-S2 heard Gastrointestinal system: Abdomen is nondistended, soft and nontender.  Bowel sounds are heard  extremities: Bilateral lower extremity edema present.  No clubbing   Data Reviewed: I have personally reviewed following labs and imaging studies  CBC: Recent Labs  Lab 07/18/20 1000 07/18/20 1803 07/19/20 1056 07/20/20 1201 07/24/20 0505  WBC 3.2* 3.7* 3.3* 3.3* 3.9*    NEUTROABS  --   --   --   --  2.6  HGB 11.6* 11.8* 11.0* 10.7* 9.6*  HCT 33.3* 33.9* 32.1* 31.8* 29.2*  MCV 84.9 85.6 84.9 86.2 88.2  PLT 239 221 179 177 245   Basic Metabolic Panel: Recent Labs  Lab 07/19/20 1056 07/19/20 2048 07/20/20 0817 07/20/20 0817 07/20/20 1814 07/21/20 0240 07/22/20 0314 07/23/20 0422 07/24/20 0505  NA 119*   < > 124*   < > 125* 128* 127* 127* 125*  K 4.5  --  4.5  --   --  4.2 4.1 4.1 4.4  CL 89*  --  92*  --   --  94* 93* 93* 92*  CO2 23  --  24  --   --  26 27 27 26   GLUCOSE 109*  --  141*  --   --  155* 160* 174* 147*  BUN 18  --  20  --   --  26* 25* 25* 29*  CREATININE 1.31*  --  1.50*  --   --  1.89* 1.58* 1.37* 1.48*  CALCIUM 8.2*  --  8.3*  --   --  8.2* 8.1* 8.3* 8.3*  MG 1.8  --   --   --   --  1.8 1.7 2.0 1.8   < > = values in this interval not displayed.   GFR: Estimated Creatinine Clearance: 56.5 mL/min (A) (by C-G formula based on SCr of 1.48 mg/dL (H)). Liver Function Tests: Recent Labs  Lab 07/18/20 1803 07/19/20 1056 07/20/20 0817 07/21/20 0240  AST 27 25 25 24   ALT 20 17 17 17   ALKPHOS 50 48 52 55  BILITOT 0.8 1.0 0.6 0.5  PROT 6.0* 5.7* 5.8* 5.6*  ALBUMIN 2.7* 2.5* 2.5* 2.4*   No results for input(s): LIPASE, AMYLASE in the last 168 hours. Recent Labs  Lab 07/18/20 1415  AMMONIA 13   Coagulation Profile: Recent Labs  Lab 07/24/20 0505  INR 1.1   Cardiac Enzymes: No results for input(s): CKTOTAL, CKMB, CKMBINDEX, TROPONINI in the last 168 hours. BNP (last 3 results) No results for input(s): PROBNP in the last 8760 hours. HbA1C: No results for input(s): HGBA1C in the last 72 hours. CBG: Recent Labs  Lab 07/22/20 2047 07/23/20 0652 07/23/20 1229 07/23/20 1622 07/23/20 2124  GLUCAP 158* 167* 182* 203* 141*   Lipid Profile: No results for input(s): CHOL, HDL, LDLCALC, TRIG, CHOLHDL, LDLDIRECT in the last 72 hours. Thyroid Function Tests: No results for input(s): TSH, T4TOTAL, FREET4, T3FREE,  THYROIDAB in the last 72 hours. Anemia Panel: No results for input(s): VITAMINB12, FOLATE, FERRITIN, TIBC, IRON, RETICCTPCT in the last 72 hours. Sepsis Labs: No results for input(s): PROCALCITON, LATICACIDVEN in the last 168 hours.  Recent Results (from the past 240 hour(s))  SARS Coronavirus 2 by RT PCR (hospital order, performed in [facility] hospital lab) Nasopharyngeal Urine, Clean Catch     Status: None   Collection Time: 07/18/20 12:23 PM   Specimen: Urine, Clean Catch; Nasopharyngeal  Result Value Ref Range Status   SARS Coronavirus 2 NEGATIVE NEGATIVE Final    Comment: (NOTE) SARS-CoV-2 target nucleic acids are NOT DETECTED.  The SARS-CoV-2 RNA is generally detectable in upper and lower respiratory specimens during the acute phase of infection. The lowest concentration of SARS-CoV-2 viral copies this assay can detect is 250 copies / mL. A negative result does not preclude SARS-CoV-2 infection and should not be used as the sole basis for treatment or other patient management decisions.  A negative result may occur with improper specimen collection / handling, submission of specimen other than nasopharyngeal swab, presence of viral mutation(s) within the areas targeted by this assay, and inadequate number of viral copies (<250 copies / mL). A negative result must be combined with clinical observations, patient history, and epidemiological information.  Fact Sheet for Patients:   StrictlyIdeas.no  Fact Sheet for Healthcare Providers: BankingDealers.co.za  This test is not yet approved or  cleared by the [place] FDA and has been authorized for detection and/or diagnosis of SARS-CoV-2 by FDA under an Emergency Use Authorization (EUA).  This EUA will remain in effect (meaning this test can be used) for the duration of the COVID-19 declaration under Section 564(b)(1) of the Act, 21 U.S.C. section 360bbb-3(b)(1), unless the  authorization is terminated or revoked sooner.  Performed at [facility], [place] [address] [place], [place] [address]   Surgical pcr screen     Status: Abnormal   Collection Time: 07/23/20  5:17 PM   Specimen: Nasal Mucosa; Nasal Swab  Result Value Ref Range Status   MRSA, PCR NEGATIVE NEGATIVE Final   Staphylococcus aureus POSITIVE (A) NEGATIVE Final    Comment: (NOTE) The Xpert SA Assay (FDA approved for NASAL specimens in  patients 54 years of age and older), is one component of a comprehensive surveillance program. It is not intended to diagnose infection nor to guide or monitor treatment. Performed at [facility], [place] [address] [place], [place] [address]          Radiology Studies: No results found.      Scheduled Meds: . amiodarone  200 mg Oral Daily  . atorvastatin  20 mg Oral q1800  . Chlorhexidine Gluconate Cloth  6 each Topical Once  . Chlorhexidine Gluconate Cloth  6 each Topical Q0600  . digoxin  0.125 mg Oral Daily  . folic acid  1 mg Oral Daily  . gabapentin  600 mg Oral QHS  . insulin aspart  0-9 Units Subcutaneous TID WC  . isosorbide-hydrALAZINE  1 tablet Oral TID  . lidocaine  1 patch Transdermal Q24H  . multivitamin with minerals  1 tablet Oral Daily  . mupirocin ointment  1 application Nasal BID  . senna-docusate  1 tablet Oral BID  . thiamine  100 mg Oral Daily   Or  . thiamine  100 mg Intravenous Daily  . torsemide  40 mg Oral Daily   Continuous Infusions: .  ceFAZolin (ANCEF) IV            Aline August, MD [facility] 07/24/2020, 7:50 AM

## 2020-07-24 NOTE — Anesthesia Postprocedure Evaluation (Signed)
Anesthesia Post Note  Patient: [name]  Procedure(s) Performed: ANTERIOR CERVICAL DECOMPRESSION/DISCECTOMY FUSION CERVICAL FOUR-CERVICAL FIVE, CERVICAL FIVE- CERVICAL SIX (N/A )     Patient location during evaluation: PACU Anesthesia Type: General Level of consciousness: awake and alert Pain management: pain level controlled Vital Signs Assessment: post-procedure vital signs reviewed and stable Respiratory status: spontaneous breathing, nonlabored ventilation and respiratory function stable Cardiovascular status: blood pressure returned to baseline and stable Postop Assessment: no apparent nausea or vomiting Anesthetic complications: no   No complications documented.  Last Vitals:  Vitals:   07/24/20 1530 07/24/20 1545  BP: 117/90 105/63  Pulse: 85 65  Resp: 16 16  Temp:    SpO2: 98% 98%    Last Pain:  Vitals:   07/24/20 1545  TempSrc:   PainSc: Asleep                 Lidia Collum

## 2020-07-24 NOTE — Progress Notes (Signed)
Patient arrived to the unit in hospital bed. On arrival patient ambulated from the bed to the restroom with walker. Patient was oriented to the room, call light system and remote. Patient alert and oriented x4. Will continue to monitor throughout the shift.

## 2020-07-24 NOTE — Progress Notes (Signed)
   07/24/20 2310  Provider Notification  Provider Name/Title Dr Gwen Pounds  Date Provider Notified 07/24/20  Time Provider Notified 2313  Notification Type Page  Notification Reason Other (Comment) (Pt's CBG is 309, no HS Coverage order)  Response See new orders  Date of Provider Response 07/24/20  Time of Provider Response 2330

## 2020-07-24 NOTE — Anesthesia Procedure Notes (Signed)
Procedure Name: Intubation Date/Time: 07/24/2020 11:25 AM Performed by: Lowella Dell, CRNA Pre-anesthesia Checklist: Patient identified, Emergency Drugs available, Suction available and Patient being monitored Patient Re-evaluated:Patient Re-evaluated prior to induction Oxygen Delivery Method: Circle System Utilized Preoxygenation: Pre-oxygenation with 100% oxygen Induction Type: IV induction Ventilation: Mask ventilation without difficulty and Oral airway inserted - appropriate to patient size Laryngoscope Size: Glidescope and 4 (elective d/t cervical myelopathy) Grade View: Grade I Tube type: Oral Tube size: 7.5 mm Number of attempts: 1 Airway Equipment and Method: Oral airway,  Video-laryngoscopy and Rigid stylet Placement Confirmation: ETT inserted through vocal cords under direct vision,  positive ETCO2 and breath sounds checked- equal and bilateral Secured at: 23 cm Tube secured with: Tape Dental Injury: Teeth and Oropharynx as per pre-operative assessment

## 2020-07-24 NOTE — Anesthesia Preprocedure Evaluation (Addendum)
Anesthesia Evaluation  Patient identified by MRN, date of birth, ID band Patient awake    Reviewed: Allergy & Precautions, NPO status , Patient's Chart, lab work & pertinent test results  Airway Mallampati: II  TM Distance: >3 FB Neck ROM: Full    Dental  (+) Poor Dentition, Missing, Loose, Dental Advisory Given,    Pulmonary Current Smoker and Patient abstained from smoking.,  14 pack year history    Pulmonary exam normal        Cardiovascular hypertension, + CAD, + Past MI and +CHF (LVEF 35-40%)  Normal cardiovascular exam+ dysrhythmias (s/p DCCV 2019) Atrial Fibrillation + Valvular Problems/Murmurs (mod-severe MR) MR   1. Left ventricular ejection fraction, by estimation, is 35 to 40%. The  left ventricle has moderately decreased function. The left ventricle  demonstrates global hypokinesis. The left ventricular internal cavity size  was mildly dilated. There is mild  eccentric left ventricular hypertrophy. Left ventricular diastolic  function could not be evaluated.  2. Right ventricular systolic function is moderately reduced. The right  ventricular size is normal. Tricuspid regurgitation signal is inadequate  for assessing PA pressure.  3. Left atrial size was severely dilated.  4. Right atrial size was severely dilated.  5. The mitral valve is normal in structure. Moderate to severe mitral  valve regurgitation.  6. Tricuspid valve regurgitation is moderate.  7. The aortic valve is normal in structure. Aortic valve regurgitation is  mild.  8. The inferior vena cava is dilated in size with <50% respiratory  variability, suggesting right atrial pressure of 15 mmHg.     Neuro/Psych PSYCHIATRIC DISORDERS Depression Cervical myelopathy- MRI of the cervical spine showed severe spinal stenosis at C4-C5 C5-C6 levels with moderate cord mass-effect.  CVA    GI/Hepatic GERD  Medicated and Controlled,(+)     substance  abuse (25 drinks per week )  alcohol use, cocaine use and IV drug use, Hepatitis -, CCirrhotic, presented to ED with Na 116- up to 125 today    Endo/Other  diabetes, Type 2, Oral Hypoglycemic Agents  Renal/GU Renal Insufficiency and CRFRenal diseaseCr 1.5, CKD 3  negative genitourinary   Musculoskeletal  (+) Arthritis , Osteoarthritis,    Abdominal   Peds  Hematology  (+) Blood dyscrasia, anemia , hct 29.2   Anesthesia Other Findings   Reproductive/Obstetrics negative OB ROS                            Anesthesia Physical Anesthesia Plan  ASA: IV  Anesthesia Plan: General   Post-op Pain Management:    Induction: Intravenous  PONV Risk Score and Plan: 1 and Ondansetron, Dexamethasone, Treatment may vary due to age or medical condition and Midazolam  Airway Management Planned: Oral ETT  Additional Equipment: None  Intra-op Plan:   Post-operative Plan: Extubation in OR  Informed Consent: I have reviewed the patients History and Physical, chart, labs and discussed the procedure including the risks, benefits and alternatives for the proposed anesthesia with the patient or authorized representative who has indicated his/her understanding and acceptance.     Dental advisory given  Plan Discussed with:   Anesthesia Plan Comments:        Anesthesia Quick Evaluation

## 2020-07-24 NOTE — Progress Notes (Addendum)
Patient ID: Brad [name], male   DOB: 1961/02/19, 59 y.o.   MRN: [id number]     Advanced Heart Failure Rounding Note  PCP-Cardiologist: [name], MD   Subjective:    Awaiting surgery today. Continues w/ neck pain.   Volume up. Neck veins distended. SCr 1.58>>1.37>>1.48  Na back down to 125.     Objective:   Weight Range: 82.9 kg Body mass index is 25.48 kg/m.   Vital Signs:   Temp:  [97.8 F (36.6 C)-98.2 F (36.8 C)] 98.2 F (36.8 C) (08/03 0556) Pulse Rate:  [68-82] 82 (08/03 0556) Resp:  [17-18] 18 (08/03 0556) BP: (91-129)/(64-83) 124/70 (08/03 0556) SpO2:  [98 %-99 %] 99 % (08/03 0556) Weight:  [82.9 kg] 82.9 kg (08/03 0556) Last BM Date: 07/23/20  Weight change: Filed Weights   07/22/20 0328 07/23/20 0411 07/24/20 0556  Weight: 80 kg 82.2 kg 82.9 kg    Intake/Output:   Intake/Output Summary (Last 24 hours) at 07/24/2020 0933 Last data filed at 07/24/2020 0500 Gross per 24 hour  Intake 1198 ml  Output 450 ml  Net 748 ml      Physical Exam   General:  Well appearing. Laying in bed.  No resp difficulty HEENT: dentition in poor repair, otherwise normal  Neck: supple. JVP elevated to ear/jaw . Carotids 2+ bilat; no bruits. No lymphadenopathy or thryomegaly appreciated. Cor: PMI nondisplaced. Irregular rate & rhythm. No rubs, gallops or murmurs. Lungs: clear Abdomen: soft, nontender, nondistended. No hepatosplenomegaly. No bruits or masses. Good bowel sounds. Extremities: no cyanosis, clubbing, rash, edema Neuro: alert & orientedx3, cranial nerves grossly intact. moves all 4 extremities w/o difficulty. Affect pleasant   Telemetry    Afib 60s-70s, occasional PVCs personally reviewed.   Labs    CBC Recent Labs    07/24/20 0505  WBC 3.9*  NEUTROABS 2.6  HGB 9.6*  HCT 29.2*  MCV 88.2  PLT 353   Basic Metabolic Panel Recent Labs    07/23/20 0422 07/24/20 0505  NA 127* 125*  K 4.1 4.4  CL 93* 92*  CO2 27 26  GLUCOSE 174* 147*    BUN 25* 29*  CREATININE 1.37* 1.48*  CALCIUM 8.3* 8.3*  MG 2.0 1.8   Liver Function Tests No results for input(s): AST, ALT, ALKPHOS, BILITOT, PROT, ALBUMIN in the last 72 hours. No results for input(s): LIPASE, AMYLASE in the last 72 hours. Cardiac Enzymes No results for input(s): CKTOTAL, CKMB, CKMBINDEX, TROPONINI in the last 72 hours.  BNP: BNP (last 3 results) Recent Labs    05/29/20 0430 06/21/20 0626 06/30/20 0657  BNP 678.3* 584.3* 656.4*    ProBNP (last 3 results) No results for input(s): PROBNP in the last 8760 hours.   D-Dimer No results for input(s): DDIMER in the last 72 hours. Hemoglobin A1C No results for input(s): HGBA1C in the last 72 hours. Fasting Lipid Panel No results for input(s): CHOL, HDL, LDLCALC, TRIG, CHOLHDL, LDLDIRECT in the last 72 hours. Thyroid Function Tests No results for input(s): TSH, T4TOTAL, T3FREE, THYROIDAB in the last 72 hours.  Invalid input(s): FREET3  Other results:   Imaging    No results found.   Medications:     Scheduled Medications: . amiodarone  200 mg Oral Daily  . atorvastatin  20 mg Oral q1800  . Chlorhexidine Gluconate Cloth  6 each Topical Once  . Chlorhexidine Gluconate Cloth  6 each Topical Q0600  . digoxin  0.125 mg Oral Daily  . folic acid  1  mg Oral Daily  . gabapentin  600 mg Oral QHS  . insulin aspart  0-9 Units Subcutaneous TID WC  . isosorbide-hydrALAZINE  1 tablet Oral TID  . lidocaine  1 patch Transdermal Q24H  . multivitamin with minerals  1 tablet Oral Daily  . mupirocin ointment  1 application Nasal BID  . senna-docusate  1 tablet Oral BID  . thiamine  100 mg Oral Daily   Or  . thiamine  100 mg Intravenous Daily  . torsemide  40 mg Oral Daily    Infusions: .  ceFAZolin (ANCEF) IV      PRN Medications: acetaminophen **OR** acetaminophen, bisacodyl, diphenhydrAMINE, loratadine, methocarbamol, ondansetron **OR** ondansetron (ZOFRAN) IV, oxyCODONE-acetaminophen, polyethylene  glycol, promethazine, senna-docusate, traMADol   Assessment/Plan   1. Pre-operative evaluation: Patient had cath in 6/21 with nonobstructive coronary disease (and elevated filling pressures).  He has atypical, non-cardiac chest pain (probably from prior rib fractures).  Currently, he is volume overloaded on exam and also significantly hyponatremic (125 this morning).  He needs cervical fusion for c-spine stenosis and cord compression.   - Per Dr Aundra Dubin  think that surgery is reasonable  - Will give dose of tolvaptan 15 mg x 1 today prior to OR   2. Hyponatremia:Recurrent.[id number] at admission. Hypervolemic hyponatremia in setting of excess fluid intake at home.  He has had tolvaptan x 2. Sodium remains 125.   - Will give additional tolvaptan today, 15 mg x 1, continue to restrict fluids.  2. Acute on chronic systolic CHF: Echo (2/53) EF 35%, moderately reduced RV fxn, moderate-severe MR, moderate TR. Nonischemic cardiomyopathy with nonobstructive CAD on 6/21 cath, probably due to substance abuse (ETOH, prior cocaine).  - He is volume overloaded on exam today  - Give IV Lasix 80 mg bid today  - Creatinine 1.48. follow BMP  -Continue digoxin 0.125 mg daily. Heart rate in the 60s.  -Continue Bidil 1 tab tid.  3. Atrial fibrillation: Chronic. He has been off anticoagulation due toheavyETOH abuse and frequent falls.  -Off Coreg with low HR in past.  - Without anticoagulation, DCCV is not an option. - If he quits drinking and improves stability, start Eliquis. 4. ETOH abuse: Ideally needs rehab program.Still drinking some at home, says he has cut back a lot.  5. PVCs: Amiodarone begun at last appointment due to frequent PVCs.   - Continue amiodarone.  6. Syncope: Has had in the past, has fractured ribs.  Hard to know if this has been due to ETOH intoxication or arrhythmias.  He has been deemed not a candidate for ICD due to ETOH abuse.    Length of Stay: [address]  [place]  07/24/2020, 9:33 AM  Advanced Heart Failure Team Pager [phone] (M-F; 7a - 4p)  Please contact [facility] for night-coverage after hours (4p -7a ) and weekends on [email or web address]  Patient seen with PA, agree with the above note.   No complaints this morning, waiting for surgery.  Creatinine fairly stable 1.48 with Na 126.    General: NAD Neck: JVP 12 cm, no thyromegaly or thyroid nodule.  Lungs: Clear to auscultation bilaterally with normal respiratory effort. CV: Nondisplaced PMI.  Heart irregular S1/S2, no S3/S4, no murmur.  No peripheral edema.   Abdomen: Soft, nontender, no hepatosplenomegaly, no distention.  Skin: Intact without lesions or rashes.  Neurologic: Alert and oriented x 3.  Psych: Normal affect. Extremities: No clubbing or cyanosis.  HEENT: Normal.   Sodium is lower today, and he looks  more volume overloaded.   - Stop torsemide and will give Lasix 80 mg IV bid.   - Give dose of tolvaptan 15 mg po x 1 today.   Loralie Champagne 07/24/2020 12:24 PM

## 2020-07-24 NOTE — Plan of Care (Signed)
  Problem: Clinical Measurements: Goal: Ability to maintain clinical measurements within normal limits will improve Outcome: Progressing   

## 2020-07-24 NOTE — Progress Notes (Signed)
   Providing Compassionate, Quality Care - Together  NEUROSURGERY PROGRESS NOTE   S: seen in pacu, tolerated surgery well  O: EXAM:  BP 121/73 (BP Location: Right Arm)   Pulse 83   Temp 98.4 F (36.9 C)   Resp 17   Ht 5\' 11"  (1.803 m)   Wt 82.9 kg   SpO2 100%   BMI 25.48 kg/m   Awake, alert, oriented  CNs grossly intact  Full strength B UE/LE Incision c/d/i Drain in place Neck soft Trachea midline NLB Collar in place  ASSESSMENT:  59 y.o. male with  1. 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 C4-C6 severe cord compression with cord signal change -s/p ACDF C4-6 on 07/24/2020   PLAN: -collar - monitor drain - pt/ot - pain control - neuro checks q4h - dvt ppx w sqh starting tomorrow - if needs aspirin can restart in 10 days    Thank you for allowing me to participate in this patient's care.  Please do not hesitate to call with questions or concerns.   Elwin Sleight, [facility] Cell: [phone]

## 2020-07-25 ENCOUNTER — Encounter (HOSPITAL_COMMUNITY): Payer: Self-pay | Admitting: Neurological Surgery

## 2020-07-25 DIAGNOSIS — S2241XD Multiple fractures of ribs, right side, subsequent encounter for fracture with routine healing: Secondary | ICD-10-CM

## 2020-07-25 LAB — BASIC METABOLIC PANEL
Anion gap: 14 (ref 5–15)
BUN: 33 mg/dL — ABNORMAL HIGH (ref 6–20)
CO2: 24 mmol/L (ref 22–32)
Calcium: 8.5 mg/dL — ABNORMAL LOW (ref 8.9–10.3)
Chloride: 89 mmol/L — ABNORMAL LOW (ref 98–111)
Creatinine, Ser: 1.63 mg/dL — ABNORMAL HIGH (ref 0.61–1.24)
GFR calc Af Amer: 52 mL/min — ABNORMAL LOW (ref >60)
GFR calc non Af Amer: 45 mL/min — ABNORMAL LOW (ref >60)
Glucose, Bld: 195 mg/dL — ABNORMAL HIGH (ref 70–99)
Potassium: 4.4 mmol/L (ref 3.5–5.1)
Sodium: 127 mmol/L — ABNORMAL LOW (ref 135–145)

## 2020-07-25 LAB — CBC WITH DIFFERENTIAL/PLATELET
Abs Immature Granulocytes: 0.02 10*3/uL (ref 0.00–0.07)
Basophils Absolute: 0 10*3/uL (ref 0.0–0.1)
Basophils Relative: 0 %
Eosinophils Absolute: 0 10*3/uL (ref 0.0–0.5)
Eosinophils Relative: 0 %
HCT: 32.2 % — ABNORMAL LOW (ref 39.0–52.0)
Hemoglobin: 10.7 g/dL — ABNORMAL LOW (ref 13.0–17.0)
Immature Granulocytes: 0 %
Lymphocytes Relative: 2 %
Lymphs Abs: 0.1 10*3/uL — ABNORMAL LOW (ref 0.7–4.0)
MCH: 29.8 pg (ref 26.0–34.0)
MCHC: 33.2 g/dL (ref 30.0–36.0)
MCV: 89.7 fL (ref 80.0–100.0)
Monocytes Absolute: 0.5 10*3/uL (ref 0.1–1.0)
Monocytes Relative: 7 %
Neutro Abs: 5.5 10*3/uL (ref 1.7–7.7)
Neutrophils Relative %: 91 %
Platelets: 188 10*3/uL (ref 150–400)
RBC: 3.59 MIL/uL — ABNORMAL LOW (ref 4.22–5.81)
RDW: 14.5 % (ref 11.5–15.5)
WBC: 6.1 10*3/uL (ref 4.0–10.5)
nRBC: 0 % (ref 0.0–0.2)

## 2020-07-25 LAB — GLUCOSE, CAPILLARY
Glucose-Capillary: 167 mg/dL — ABNORMAL HIGH (ref 70–99)
Glucose-Capillary: 177 mg/dL — ABNORMAL HIGH (ref 70–99)
Glucose-Capillary: 194 mg/dL — ABNORMAL HIGH (ref 70–99)
Glucose-Capillary: 186 mg/dL — ABNORMAL HIGH (ref 70–99)

## 2020-07-25 LAB — MAGNESIUM: Magnesium: 1.6 mg/dL — ABNORMAL LOW (ref 1.7–2.4)

## 2020-07-25 LAB — DIGOXIN LEVEL: Digoxin Level: 0.4 ng/mL — ABNORMAL LOW (ref 0.8–2.0)

## 2020-07-25 MED ORDER — GABAPENTIN 300 MG PO CAPS
600.0000 mg | ORAL_CAPSULE | Freq: Two times a day (BID) | ORAL | Status: DC
  Administered 2020-07-25 – 2020-07-27 (×5): 600 mg via ORAL
  Filled 2020-07-25 (×5): qty 2

## 2020-07-25 MED ORDER — MAGNESIUM SULFATE IN D5W 1-5 GM/100ML-% IV SOLN
1.0000 g | Freq: Once | INTRAVENOUS | Status: AC
  Administered 2020-07-25: 1 g via INTRAVENOUS
  Filled 2020-07-25: qty 100

## 2020-07-25 MED ORDER — TORSEMIDE 20 MG PO TABS
40.0000 mg | ORAL_TABLET | Freq: Every day | ORAL | Status: DC
  Administered 2020-07-25 – 2020-07-27 (×3): 40 mg via ORAL
  Filled 2020-07-25 (×3): qty 2

## 2020-07-25 MED ORDER — SPIRONOLACTONE 12.5 MG HALF TABLET
12.5000 mg | ORAL_TABLET | Freq: Every day | ORAL | Status: DC
  Administered 2020-07-25 – 2020-07-27 (×3): 12.5 mg via ORAL
  Filled 2020-07-25 (×3): qty 1

## 2020-07-25 NOTE — Progress Notes (Signed)
Orthopedic Tech Progress Note Patient Details:  Brad [name] 13-Sep-1961 [id number] RN said patient has collar Patient ID: Brad [name], male   DOB: [birth year], 59 y.o.   MRN: [id number]   [name] 07/25/2020, 9:38 AM

## 2020-07-25 NOTE — Evaluation (Signed)
Occupational Therapy Evaluation Patient Details Name: Brad [name] MRN: [id number] DOB: 01/21/1961 Today's Date: 07/25/2020    History of Present Illness 59 y.o. male with medical history significant of diet-controlled diabetes mellitus, systolic CHF with ejection fraction of 25 to 30%, A. fib on Eliquis (s/p TEE and cardioversion), CKD stage III, chronic hyponatremia, recurrent falls, hypertension, tobacco abuse, alcohol abuse, cocaine abuse, depression/anxiety, hyperlipidemia presents to emergency department due to incr rt sided rib pain (fell ~1 month prior with anterolateral 5-6th rib fractures) and RLE myoclonic jerking. Also recently admitted with acute on chronic hyponatremia. EEG negative; MRI C4-C6 moderate cord mass effect with suspicion of abnormal cord signal (myelomalacia versus cord edema from recent fall). Required medical work-up prior to ACDF C4-6 on 07/24/2020    Clinical Impression   PTA pt reports being independent with ADLs, walking without device since last hospitalization, and living with roommate that does not provide physical assist. Pt was admitted for above and treated for problem list below (see OT Problem List). Pt is A&Ox4 but is impulsive and has a lack of safety awareness requiring multimodal cues for safety and maintaining cervical precautions. Requires Min A - min guard with transfers and ambulation with verbal cues for safety with RW. Pt administered precaution booklet and went over with therapist. Educated on compensatory techniques for dressing, showering, and grooming at the sink to adhere to cervical precautions. Believe pt would benefit from skilled OT services acutely and at the SNF level to increase safety and independence with  ADLs.    Follow Up Recommendations  SNF;Supervision/Assistance - 24 hour    Equipment Recommendations  3 in 1 bedside commode       Precautions / Restrictions Precautions Precautions: Cervical;Fall Precaution Booklet Issued:  Yes (comment) Precaution Comments: went over booklet with patient Required Braces or Orthoses: Cervical Brace Cervical Brace: At all times;Hard collar;Other (comment) (orders for off in bed too - to be clarified) Restrictions Weight Bearing Restrictions: No      Mobility Bed Mobility Overal bed mobility: Needs Assistance Bed Mobility: Rolling;Sit to Sidelying Rolling: Min assist       Sit to sidelying: Min assist General bed mobility comments: supervision with min A for max verbal cues with log rolling technique  Transfers Overall transfer level: Needs assistance Equipment used: Rolling walker (2 wheeled) Transfers: Sit to/from Stand Sit to Stand: Min assist         General transfer comment: Frequent cues for correct use of RW and safest hand placement (both to/from stand)    Balance Overall balance assessment: History of Falls                                         ADL either performed or assessed with clinical judgement   ADL Overall ADL's : Needs assistance/impaired     Grooming: Oral care;Standing;Cueing for sequencing;Cueing for safety;Cueing for compensatory techniques;Min guard Grooming Details (indicate cue type and reason): min guard for safety needing multimodal cues for sequencing with compensatory techniques for cervical precautions Upper Body Bathing: Minimal assistance;Sitting   Lower Body Bathing: Minimal assistance;Cueing for back precautions;Sitting/lateral leans;Sit to/from stand   Upper Body Dressing : Minimal assistance;Sitting (adhering to cervical precautions)   Lower Body Dressing: Sitting/lateral leans;Sit to/from stand;Minimal assistance;Cueing for compensatory techniques;Cueing for back precautions Lower Body Dressing Details (indicate cue type and reason): pt able to pull up socks and complete figure 4 technique Toilet  Transfer: Ambulation;Min guard;Cueing for safety;Cueing for sequencing;RW Toilet Transfer Details  (indicate cue type and reason): cueing for safety with RW - simulated from recliner to bed Toileting- Clothing Manipulation and Hygiene: Sitting/lateral lean;Sit to/from stand;Minimal assistance;Cueing for compensatory techniques       Functional mobility during ADLs: Min guard;Rolling walker General ADL Comments: Pt with decreased safety awareness and decreased knowledge of cervical precautions with weakness, pain, and decreased ROM                  Pertinent Vitals/Pain Pain Assessment: Faces Pain Score: 9  Faces Pain Scale: Hurts even more Pain Location: neck Pain Descriptors / Indicators: Grimacing;Operative site guarding;Sharp Pain Intervention(s): Limited activity within patient's tolerance;Monitored during session;Repositioned;RN gave pain meds during session     Hand Dominance Right   Extremity/Trunk Assessment Upper Extremity Assessment Upper Extremity Assessment: Generalized weakness   Lower Extremity Assessment Lower Extremity Assessment: Defer to PT evaluation RLE Deficits / Details: slightly numb per patient; no tingling or myoclonic contractions RLE Sensation: decreased light touch LLE Deficits / Details: slightly numb (compared to sensation on his face); less numb than right   Cervical / Trunk Assessment Cervical / Trunk Assessment: Other exceptions Cervical / Trunk Exceptions: in hard collar   Communication Communication Communication: No difficulties   Cognition Arousal/Alertness: Awake/alert Behavior During Therapy: Anxious;Impulsive Overall Cognitive Status: No family/caregiver present to determine baseline cognitive functioning Area of Impairment: Attention;Memory;Following commands;Safety/judgement;Awareness;Problem solving                   Current Attention Level: Sustained Memory: Decreased short-term memory;Decreased recall of precautions Following Commands: Follows one step commands consistently Safety/Judgement: Decreased awareness  of safety;Decreased awareness of deficits Awareness: Emergent Problem Solving: Slow processing;Requires verbal cues General Comments: A&Ox4 with decreased recall of cervical precautions and unable to recall. Pt able to problem solve but needing multimodal cues for sequencing with compensatory techniques   General Comments  Pt slightly impulsive and unaware of safety with walker and precautions            Home Living Family/patient expects to be discharged to:: Skilled nursing facility Living Arrangements: Other (Comment) (roommate)                           Home Equipment: Gilford Rile - 2 wheels;Cane - single point   Additional Comments: sleeps on an air mattress on the floor; in process of getting section 8 housing and will live alone; denies falls since his fall 1 month ago      Prior Functioning/Environment Level of Independence: Independent        Comments: was not using an assitive device since fall one month ago;         OT Problem List: Decreased strength;Decreased range of motion;Decreased activity tolerance;Impaired balance (sitting and/or standing);Decreased safety awareness;Decreased knowledge of use of DME or AE;Decreased knowledge of precautions;Pain      OT Treatment/Interventions: Self-care/ADL training;Therapeutic exercise;Energy conservation;DME and/or AE instruction;Therapeutic activities;Patient/family education;Balance training    OT Goals(Current goals can be found in the care plan section) Acute Rehab OT Goals Patient Stated Goal: to be sure he heals fully OT Goal Formulation: With patient Time For Goal Achievement: 08/08/20 Potential to Achieve Goals: Good  OT Frequency: Min 2X/week    AM-PAC OT "6 Clicks" Daily Activity     Outcome Measure Help from another person eating meals?: None Help from another person taking care of personal grooming?: A Little Help from another person  toileting, which includes using toliet, bedpan, or urinal?: A  Little Help from another person bathing (including washing, rinsing, drying)?: A Little Help from another person to put on and taking off regular upper body clothing?: A Little Help from another person to put on and taking off regular lower body clothing?: A Little 6 Click Score: 19   End of Session Equipment Utilized During Treatment: Gait belt;Rolling walker;Cervical collar Nurse Communication: Mobility status;Patient requests pain meds  Activity Tolerance: Patient tolerated treatment well Patient left: in bed;with call bell/phone within reach;with bed alarm set  OT Visit Diagnosis: Unsteadiness on feet (R26.81);Muscle weakness (generalized) (M62.81);Other symptoms and signs involving the nervous system (R29.898);Pain Pain - part of body:  (neck - incisional)                Time: 8867-7373 OT Time Calculation (min): 25 min Charges:  OT General Charges $OT Visit: 1 Visit OT Evaluation $OT Eval Moderate Complexity: 1 Mod OT Treatments $Self Care/Home Management : 8-22 mins  Merion Caton/OTS  Bomani Oommen 07/25/2020, 10:44 AM

## 2020-07-25 NOTE — Progress Notes (Signed)
[facility]  PROGRESS NOTE  Brad [name] XAJ:[id number] DOB: 1961/06/19 DOA: 07/18/2020 PCP: Charlott Rakes, MD   Brief HPI:   59 year old male with history of chronic systolic heart failure, EF 35 to 40% on echo from 04/03/2020, hypertension, chronic hyponatremia, alcohol abuse, diabetes mellitus type 2, history of hepatitis C with liver cirrhosis, depression, recent multiple hospitalization with rib pain/chest pain, hyponatremia came with severe right rib pain along with continued jerking of right leg.  In the ED his sodium was found to be 116 and he was started on IV fluids.  Nephrology was consulted.  Chest x-ray showed mildly displaced fracture of the anterolateral right fifth rib and nondisplaced anterior right severe right sixth rib fracture.  Neurology was consulted for question of myoclonic jerks.  EEG did not show seizure.  MRI of cervical spine showed severe spinal stenosis at C4-C5, C5-C6 level with moderate cord mass-effect.  Neurosurgery was consulted.  Patient was transferred to [facility].   Subjective   Patient seen and examined, s/p anterior cervical discectomy and fusion(ACDF) C4-C6 for severe cord compression.  Cervical neck collar in place.  Complains of pain.  He is on IV Dilaudid and Percocet as needed.   Assessment/Plan:     1. Acute on chronic hyponatremia-patient has chronic hyponatremia with baseline sodium 1 27-1 30.  He presented with sodium of 116.  Nephrology was consulted.  Patient started on tolvaptan.  Today sodium has improved to 127.  Cardiology has started patient on torsemide 40 mg p.o. daily.  Follow serum sodium level in a.m. 2. Severe cervical stenosis at C4-5, C5-6-moderate cord mass-effect.  He presented with abnormal jerking movements of extremities..  Neurosurgery was consulted and patient underwent ACDF.  EEG did not show seizure activity.  Neurology has signed off. 3. Right rib fracture-patient presents with right rib pain due to  right hip fracture from a fall more than a month ago.  Chest x-ray showed mildly displaced fracture of the anterolateral right fifth rib.  Nondisplaced anterior severe right sixth rib fracture.  Continue lidocaine patch.  Will increase dose of gabapentin to 600 g p.o. twice daily.  Continue IV Dilaudid as needed, Percocet as needed. 4. Hypertension-blood pressure is stable, continue Aldactone, torsemide. 5. Paroxysmal atrial fibrillation-continue amiodarone, aspirin was discontinued by neurosurgery for possible need for surgical intervention.  Heart rate is controlled. 6. Alcohol abuse-monitor signs of alcohol withdrawal.  Continue CIWA protocol. 7. Chronic HCV with liver cirrhosis-LFTs are stable on presentation.  Follow-up as outpatient   Scheduled medications:   . amiodarone  200 mg Oral Daily  . atorvastatin  20 mg Oral q1800  . digoxin  0.125 mg Oral Daily  . folic acid  1 mg Oral Daily  . gabapentin  600 mg Oral BID  . heparin injection (subcutaneous)  5,000 Units Subcutaneous Q12H  . insulin aspart  0-9 Units Subcutaneous TID WC  . isosorbide-hydrALAZINE  1 tablet Oral TID  . lidocaine  1 patch Transdermal Q24H  . multivitamin with minerals  1 tablet Oral Daily  . senna-docusate  1 tablet Oral BID  . sodium chloride flush  3 mL Intravenous Q12H  . spironolactone  12.5 mg Oral Daily  . thiamine  100 mg Oral Daily   Or  . thiamine  100 mg Intravenous Daily  . torsemide  40 mg Oral Daily         CBG: Recent Labs  Lab 07/24/20 1434 07/24/20 1702 07/24/20 2306 07/25/20 0610 07/25/20 1212  GLUCAP 143*  171* 309* 167* 177*    SpO2: 99 % O2 Flow Rate (L/min): 2 L/min    CBC: Recent Labs  Lab 07/19/20 1056 07/20/20 1201 07/24/20 0505 07/24/20 1745 07/25/20 0047  WBC 3.3* 3.3* 3.9* 5.1 6.1  NEUTROABS  --   --  2.6  --  5.5  HGB 11.0* 10.7* 9.6* 11.9* 10.7*  HCT 32.1* 31.8* 29.2* 36.6* 32.2*  MCV 84.9 86.2 88.2 89.1 89.7  PLT 179 177 156 203 188    Basic  Metabolic Panel: Recent Labs  Lab 07/21/20 0240 07/21/20 0240 07/22/20 0314 07/22/20 0314 07/23/20 0422 07/24/20 0505 07/24/20 0950 07/24/20 1745 07/25/20 0047  NA 128*   < > 127*   < > 127* 125* 126* 130* 127*  K 4.2   < > 4.1  --  4.1 4.4 4.3  --  4.4  CL 94*   < > 93*  --  93* 92* 93*  --  89*  CO2 26   < > 27  --  27 26 27   --  24  GLUCOSE 155*   < > 160*  --  174* 147* 130*  --  195*  BUN 26*   < > 25*  --  25* 29* 28*  --  33*  CREATININE 1.89*   < > 1.58*   < > 1.37* 1.48* 1.44* 1.64* 1.63*  CALCIUM 8.2*   < > 8.1*  --  8.3* 8.3* 8.3*  --  8.5*  MG 1.8  --  1.7  --  2.0 1.8  --   --  1.6*   < > = values in this interval not displayed.     Liver Function Tests: Recent Labs  Lab 07/18/20 1803 07/19/20 1056 07/20/20 0817 07/21/20 0240  AST 27 25 25 24   ALT 20 17 17 17   ALKPHOS 50 48 52 55  BILITOT 0.8 1.0 0.6 0.5  PROT 6.0* 5.7* 5.8* 5.6*  ALBUMIN 2.7* 2.5* 2.5* 2.4*     Antibiotics: Anti-infectives (From admission, onward)   Start     Dose/Rate Route Frequency Ordered Stop   07/24/20 1930  ceFAZolin (ANCEF) IVPB 2g/100 mL premix        2 g 200 mL/hr over 30 Minutes Intravenous Every 8 hours 07/24/20 1639 07/25/20 1234   07/24/20 1030  bacitracin 50,000 Units in sodium chloride 0.9 % 500 mL irrigation  Status:  Discontinued          As needed 07/24/20 1120 07/24/20 1436   07/24/20 0600  ceFAZolin (ANCEF) IVPB 2g/100 mL premix        2 g 200 mL/hr over 30 Minutes Intravenous On call to O.R. 07/23/20 1701 07/24/20 1135       DVT prophylaxis: Heparin  Code Status: Full code  Family Communication: No family at bedside    Status is: Inpatient  Dispo: The patient is from: Home              Anticipated d/c is to: Skilled nursing facility              Anticipated d/c date is: 07/27/2020              Patient currently not medically stable for discharge  Barrier to discharge-awaiting PT evaluation, pain control        Consultants:  Neurosurgery  Cardiology  Procedures:  Anterior cervical discectomy and fusion   Objective   Vitals:   07/25/20 0335 07/25/20 0815 07/25/20 1210 07/25/20 1538  BP: Marland Kitchen)  144/80 (!) 139/96 (!) 143/86 113/69  Pulse: 63 73 68 68  Resp: 18 20 16 20   Temp: 98.9 F (37.2 C) 97.6 F (36.4 C) 97.7 F (36.5 C) 98.9 F (37.2 C)  TempSrc: Oral Oral Oral   SpO2: 98% 100% 98% 99%  Weight: 82.5 kg     Height:        Intake/Output Summary (Last 24 hours) at 07/25/2020 1546 Last data filed at 07/25/2020 1500 Gross per 24 hour  Intake 1037.34 ml  Output 3185 ml  Net -2147.66 ml    08/02 1901 - 08/04 0700 In: 2017.1 [P.O.:600; I.V.:1317.1] Out: 3035 [Urine:2900; Drains:35]  Filed Weights   07/23/20 0411 07/24/20 0556 07/25/20 0335  Weight: 82.2 kg 82.9 kg 82.5 kg    Physical Examination:    General: Appears in no acute distress, neck collar in place  Cardiovascular: S1-S2, regular, no murmur auscultated  Respiratory: Clear to auscultation bilaterally  Abdomen: Soft, nontender, no organomegaly  Extremities: No edema in the lower extremities  Neurologic: Alert, oriented x4, no focal deficit noted    Data Reviewed:   Recent Results (from the past 240 hour(s))  SARS Coronavirus 2 by RT PCR (hospital order, performed in [facility] hospital lab) Nasopharyngeal Urine, Clean Catch     Status: None   Collection Time: 07/18/20 12:23 PM   Specimen: Urine, Clean Catch; Nasopharyngeal  Result Value Ref Range Status   SARS Coronavirus 2 NEGATIVE NEGATIVE Final    Comment: (NOTE) SARS-CoV-2 target nucleic acids are NOT DETECTED.  The SARS-CoV-2 RNA is generally detectable in upper and lower respiratory specimens during the acute phase of infection. The lowest concentration of SARS-CoV-2 viral copies this assay can detect is 250 copies / mL. A negative result does not preclude SARS-CoV-2 infection and should not be used as the sole basis for treatment or  other patient management decisions.  A negative result may occur with improper specimen collection / handling, submission of specimen other than nasopharyngeal swab, presence of viral mutation(s) within the areas targeted by this assay, and inadequate number of viral copies (<250 copies / mL). A negative result must be combined with clinical observations, patient history, and epidemiological information.  Fact Sheet for Patients:   StrictlyIdeas.no  Fact Sheet for Healthcare Providers: BankingDealers.co.za  This test is not yet approved or  cleared by the [place] FDA and has been authorized for detection and/or diagnosis of SARS-CoV-2 by FDA under an Emergency Use Authorization (EUA).  This EUA will remain in effect (meaning this test can be used) for the duration of the COVID-19 declaration under Section 564(b)(1) of the Act, 21 U.S.C. section 360bbb-3(b)(1), unless the authorization is terminated or revoked sooner.  Performed at [facility], [place] [address] [place], [place] [address]   Surgical pcr screen     Status: Abnormal   Collection Time: 07/23/20  5:17 PM   Specimen: Nasal Mucosa; Nasal Swab  Result Value Ref Range Status   MRSA, PCR NEGATIVE NEGATIVE Final   Staphylococcus aureus POSITIVE (A) NEGATIVE Final    Comment: (NOTE) The Xpert SA Assay (FDA approved for NASAL specimens in patients 44 years of age and older), is one component of a comprehensive surveillance program. It is not intended to diagnose infection nor to guide or monitor treatment. Performed at [facility], [place] [address] [place],  [address]      BNP (last 3 results) Recent Labs    05/29/20 0430 06/21/20 6063 06/30/20 0160  BNP 678.3* 584.3* 656.4*     Studies:  DG Cervical Spine 2-3 Views  Result Date: 07/24/2020 CLINICAL DATA:  Provided history: Surgery, elective. C4-6 ACDF. Fluoroscopy time 8  seconds, 0.35 mGy. EXAM: CERVICAL SPINE - 2-3 VIEW; DG C-ARM 1-60 MIN COMPARISON:  Cervical spine MRI 07/18/2020 FINDINGS: PA and lateral view fluoroscopic images of the cervical spine are submitted, two images total. Sequela of interval C4-C6 ACDF. No unexpected finding. Partially visualized support tubes. IMPRESSION: Two intraoperative fluoroscopic images of the cervical spine demonstrating interval C4-C6 ACDF. No unexpected finding. Electronically Signed   By: Kellie Simmering DO   On: 07/24/2020 14:15   DG C-Arm 1-60 Min  Result Date: 07/24/2020 CLINICAL DATA:  Provided history: Surgery, elective. C4-6 ACDF. Fluoroscopy time 8 seconds, 0.35 mGy. EXAM: CERVICAL SPINE - 2-3 VIEW; DG C-ARM 1-60 MIN COMPARISON:  Cervical spine MRI 07/18/2020 FINDINGS: PA and lateral view fluoroscopic images of the cervical spine are submitted, two images total. Sequela of interval C4-C6 ACDF. No unexpected finding. Partially visualized support tubes. IMPRESSION: Two intraoperative fluoroscopic images of the cervical spine demonstrating interval C4-C6 ACDF. No unexpected finding. Electronically Signed   By: Kellie Simmering DO   On: 07/24/2020 14:15       Oswald Hillock   [facility] If 7PM-7AM, please contact night-coverage at [email or web address], Office  [phone]   07/25/2020, 3:46 PM  LOS: 7 days

## 2020-07-25 NOTE — Progress Notes (Signed)
   Providing Compassionate, Quality Care - Together  NEUROSURGERY PROGRESS NOTE   S: No issues overnight. Eating well, states his numbness and tingling is significantly improved and has no more spastic movements in BLE  O: EXAM:  BP (!) 143/86 (BP Location: Right Arm)   Pulse 68   Temp 97.7 F (36.5 C) (Oral)   Resp 16   Ht 5\' 11"  (1.803 m)   Wt 82.5 kg   SpO2 98%   BMI 25.37 kg/m   Awake, alert, oriented  CNs grossly intact  Full strength B UE/LE Incision c/d/i Drain in place Neck soft Trachea midline NLB Collar in place   ASSESSMENT:  59 y.o. male with  1. 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 C4-C6 severe cord compression with cord signal change -s/p ACDF C4-6 on 07/24/2020   PLAN: -collar - dc drain - pt/ot - pain control - neuro checks q4h - dvt ppx w sqh ok - if needs aspirin can restart in 10 days from surgery - rehab likely - will need f/u with me in 10 days for staple removal     Thank you for allowing me to participate in this patient's care.  Please do not hesitate to call with questions or concerns.   Elwin Sleight, [facility] Cell: [phone]

## 2020-07-25 NOTE — Evaluation (Signed)
Physical Therapy Evaluation Patient Details Name: Brad [name] MRN: [id number] DOB: [birth year] Today's Date: 07/25/2020   History of Present Illness  59 y.o. male with medical history significant of diet-controlled diabetes mellitus, systolic CHF with ejection fraction of 25 to 30%, A. fib on Eliquis (s/p TEE and cardioversion), CKD stage III, chronic hyponatremia, recurrent falls, hypertension, tobacco abuse, alcohol abuse, cocaine abuse, depression/anxiety, hyperlipidemia presents to emergency department due to incr rt sided rib pain (fell ~1 month prior with anterolateral 5-6th rib fractures) and RLE myoclonic jerking. Also recently admitted with acute on chronic hyponatremia. EEG negative; MRI C4-C6 moderate cord mass effect with suspicion of abnormal cord signal (myelomalacia versus cord edema from recent fall). Required medical work-up prior to ACDF C4-6 on 07/24/2020   Clinical Impression   Patient is s/p above surgery resulting in functional limitations due to the deficits listed below (see PT Problem List). Patient reports he had returned to walking without a device since last hospitalization. He currently resides with a roommate (who he reports does not provide assistance) and he is sleeping on an air mattress on the floor. He has been working on moving. He currently requires max cues to adhere to his cervical precautions and min-mod assist for safe mobility with a RW.  Patient will benefit from skilled PT to increase their independence and safety with mobility to allow discharge to the venue listed below.       Follow Up Recommendations SNF    Equipment Recommendations  None recommended by PT    Recommendations for Other Services       Precautions / Restrictions Precautions Precautions: Cervical;Fall Precaution Booklet Issued: No Precaution Comments: Educated on cervical precautions; OT in at end of session and to give handout Required Braces or Orthoses: Cervical Brace Cervical  Brace: At all times;Hard collar;Other (comment) (actually also has orders for off in bed--to be clarified) Restrictions Weight Bearing Restrictions: No      Mobility  Bed Mobility Overal bed mobility: Needs Assistance             General bed mobility comments: Pt with HOB fully elevated with pain 9/10; educated on progression to getting OOB from [facility] at zero, however today allowed pivot to EOB from upright position with minguard assist with cues to minimize pushing through UEs. Anticipate he will need mod assist to get to EOB if HOB at zero  Transfers Overall transfer level: Needs assistance Equipment used: Rolling walker (2 wheeled) Transfers: Sit to/from Stand Sit to Stand: Min assist         General transfer comment: Frequent cues for correct use of RW and safest hand placement (both to/from stand)  Ambulation/Gait Ambulation/Gait assistance: Min assist Gait Distance (Feet): 80 Feet Assistive device: Rolling walker (2 wheeled) Gait Pattern/deviations: Step-through pattern;Decreased stride length (poor foot clearance) Gait velocity: decr   General Gait Details: vc for upright posture; frequent cues to turn his body when needing to look left or right and NOT turning his head/neck  Stairs            Wheelchair Mobility    Modified [name] (Stroke Patients Only)       Balance Overall balance assessment: History of Falls                                           Pertinent Vitals/Pain Pain Assessment: 0-10 Pain Score: 9  Pain Location:  neck>ribs Pain Descriptors / Indicators: Grimacing;Operative site guarding;Sharp Pain Intervention(s): Limited activity within patient's tolerance;Monitored during session;Patient requesting pain meds-RN notified;RN gave pain meds during session    Home Living Family/patient expects to be discharged to:: Skilled nursing facility Living Arrangements: Other (Comment) (roommate)             Home  Equipment: Gilford Rile - 2 wheels;Cane - single point Additional Comments: sleeps on an air mattress on the floor; in process of getting section 8 housing and will live alone; denies falls since his fall 1 month ago    Prior Function Level of Independence: Independent         Comments: was not using an assitive device since fall one month ago;      Hand Dominance   Dominant Hand: Right    Extremity/Trunk Assessment   Upper Extremity Assessment Upper Extremity Assessment: Defer to OT evaluation    Lower Extremity Assessment Lower Extremity Assessment: RLE deficits/detail;LLE deficits/detail RLE Deficits / Details: slightly numb per patient; no tingling or myoclonic contractions RLE Sensation: decreased light touch LLE Deficits / Details: slightly numb (compared to sensation on his face); less numb than right    Cervical / Trunk Assessment Cervical / Trunk Assessment: Other exceptions Cervical / Trunk Exceptions: in hard collar  Communication   Communication: No difficulties  Cognition Arousal/Alertness: Awake/alert Behavior During Therapy: Anxious;Impulsive Overall Cognitive Status: No family/caregiver present to determine baseline cognitive functioning                                 General Comments: a&o x 4; moves quickly with poor attention to IV line; requires PT to interrupt frequently to cue him for safe use of RW and to adhere to cervical precautions      General Comments General comments (skin integrity, edema, etc.): Patient anxious to improve and slightly impulsive/moves too quickly for his current condition    Exercises     Assessment/Plan    PT Assessment Patient needs continued PT services  PT Problem List Decreased range of motion;Decreased activity tolerance;Decreased mobility;Decreased knowledge of use of DME;Decreased cognition;Decreased safety awareness;Decreased knowledge of precautions;Impaired sensation;Pain       PT Treatment  Interventions DME instruction;Gait training;Stair training;Functional mobility training;Therapeutic activities;Cognitive remediation;Patient/family education    PT Goals (Current goals can be found in the Care Plan section)  Acute Rehab PT Goals Patient Stated Goal: to be sure he heals fully PT Goal Formulation: With patient Time For Goal Achievement: 08/08/20 Potential to Achieve Goals: Good    Frequency Min 3X/week   Barriers to discharge Decreased caregiver support;Other (comment) only has an air mattress to sleep on; no assist from roommate (pt in process of seeking other housingJ)    Co-evaluation               AM-PAC PT "6 Clicks" Mobility  Outcome Measure Help needed turning from your back to your side while in a flat bed without using bedrails?: A Lot Help needed moving from lying on your back to sitting on the side of a flat bed without using bedrails?: A Lot Help needed moving to and from a bed to a chair (including a wheelchair)?: A Little Help needed standing up from a chair using your arms (e.g., wheelchair or bedside chair)?: A Little Help needed to walk in hospital room?: A Little Help needed climbing 3-5 steps with a railing? : A Lot 6 Click Score: 15  End of Session Equipment Utilized During Treatment: Gait belt;Cervical collar Activity Tolerance: No increased pain Patient left: in chair;with call bell/phone within reach;Other (comment) (OT entered at end of session for eval) Nurse Communication: Patient requests pain meds PT Visit Diagnosis: Other abnormalities of gait and mobility (R26.89);History of falling (Z91.81)    Time: 0349-1791 PT Time Calculation (min) (ACUTE ONLY): 30 min   Charges:   PT Evaluation $PT Eval Low Complexity: 1 Low PT Treatments $Self Care/Home Management: 8-22         Arby Barrette, PT Pager [phone]   Rexanne Mano 07/25/2020, 9:54 AM

## 2020-07-25 NOTE — Progress Notes (Addendum)
Patient ID: Brad [name], male   DOB: [birth year], 59 y.o.   MRN: [id number]     Advanced Heart Failure Rounding Note  PCP-Cardiologist: [name], MD  HF MD: Dr Aundra Dubin  Subjective:    07/24/20  S/P  s/p ACDF C4-6   Frustrated wants to take the collar off. Denies CP/SOB.   Objective:   Weight Range: 82.5 kg Body mass index is 25.37 kg/m.   Vital Signs:   Temp:  [96.7 F (35.9 C)-98.9 F (37.2 C)] 97.6 F (36.4 C) (08/04 0815) Pulse Rate:  [54-85] 73 (08/04 0815) Resp:  [15-20] 20 (08/04 0815) BP: (105-146)/(63-96) 139/96 (08/04 0815) SpO2:  [92 %-100 %] 100 % (08/04 0815) Weight:  [82.5 kg] 82.5 kg (08/04 0335) Last BM Date: 07/23/20  Weight change: Filed Weights   07/23/20 0411 07/24/20 0556 07/25/20 0335  Weight: 82.2 kg 82.9 kg 82.5 kg    Intake/Output:   Intake/Output Summary (Last 24 hours) at 07/25/2020 1047 Last data filed at 07/25/2020 0817 Gross per 24 hour  Intake 1897.06 ml  Output 2585 ml  Net -687.94 ml      Physical Exam  General:   No resp difficulty HEENT: Poor dentition normal Neck: Cervical Collar in place. Supple. no JVD. Carotids 2+ bilat; no bruits. No lymphadenopathy or thryomegaly appreciated. Cor: PMI nondisplaced. Irregular rate & rhythm. No rubs, gallops or murmurs. Lungs: clear Abdomen: soft, nontender, nondistended. No hepatosplenomegaly. No bruits or masses. Good bowel sounds. Extremities: no cyanosis, clubbing, rash, edema Neuro: alert & orientedx3, cranial nerves grossly intact. moves all 4 extremities w/o difficulty. Affect pleasant   Telemetry   A fib 60-70s   Labs    CBC Recent Labs    07/24/20 0505 07/24/20 0505 07/24/20 1745 07/25/20 0047  WBC 3.9*   < > 5.1 6.1  NEUTROABS 2.6  --   --  5.5  HGB 9.6*   < > 11.9* 10.7*  HCT 29.2*   < > 36.6* 32.2*  MCV 88.2   < > 89.1 89.7  PLT 156   < > 203 188   < > = values in this interval not displayed.   Basic Metabolic Panel Recent Labs    07/24/20 0505  07/24/20 0505 07/24/20 0950 07/24/20 0950 07/24/20 1745 07/25/20 0047  NA 125*   < > 126*   < > 130* 127*  K 4.4   < > 4.3  --   --  4.4  CL 92*   < > 93*  --   --  89*  CO2 26   < > 27  --   --  24  GLUCOSE 147*   < > 130*  --   --  195*  BUN 29*   < > 28*  --   --  33*  CREATININE 1.48*   < > 1.44*   < > 1.64* 1.63*  CALCIUM 8.3*   < > 8.3*  --   --  8.5*  MG 1.8  --   --   --   --  1.6*   < > = values in this interval not displayed.   Liver Function Tests No results for input(s): AST, ALT, ALKPHOS, BILITOT, PROT, ALBUMIN in the last 72 hours. No results for input(s): LIPASE, AMYLASE in the last 72 hours. Cardiac Enzymes No results for input(s): CKTOTAL, CKMB, CKMBINDEX, TROPONINI in the last 72 hours.  BNP: BNP (last 3 results) Recent Labs    05/29/20 0430 06/21/20 0263 06/30/20 7858  BNP 678.3* 584.3* 656.4*    ProBNP (last 3 results) No results for input(s): PROBNP in the last 8760 hours.   D-Dimer No results for input(s): DDIMER in the last 72 hours. Hemoglobin A1C No results for input(s): HGBA1C in the last 72 hours. Fasting Lipid Panel No results for input(s): CHOL, HDL, LDLCALC, TRIG, CHOLHDL, LDLDIRECT in the last 72 hours. Thyroid Function Tests No results for input(s): TSH, T4TOTAL, T3FREE, THYROIDAB in the last 72 hours.  Invalid input(s): FREET3  Other results:   Imaging    DG Cervical Spine 2-3 Views  Result Date: 07/24/2020 CLINICAL DATA:  Provided history: Surgery, elective. C4-6 ACDF. Fluoroscopy time 8 seconds, 0.35 mGy. EXAM: CERVICAL SPINE - 2-3 VIEW; DG C-ARM 1-60 MIN COMPARISON:  Cervical spine MRI 07/18/2020 FINDINGS: PA and lateral view fluoroscopic images of the cervical spine are submitted, two images total. Sequela of interval C4-C6 ACDF. No unexpected finding. Partially visualized support tubes. IMPRESSION: Two intraoperative fluoroscopic images of the cervical spine demonstrating interval C4-C6 ACDF. No unexpected finding.  Electronically Signed   By: Kellie Simmering DO   On: 07/24/2020 14:15   DG C-Arm 1-60 Min  Result Date: 07/24/2020 CLINICAL DATA:  Provided history: Surgery, elective. C4-6 ACDF. Fluoroscopy time 8 seconds, 0.35 mGy. EXAM: CERVICAL SPINE - 2-3 VIEW; DG C-ARM 1-60 MIN COMPARISON:  Cervical spine MRI 07/18/2020 FINDINGS: PA and lateral view fluoroscopic images of the cervical spine are submitted, two images total. Sequela of interval C4-C6 ACDF. No unexpected finding. Partially visualized support tubes. IMPRESSION: Two intraoperative fluoroscopic images of the cervical spine demonstrating interval C4-C6 ACDF. No unexpected finding. Electronically Signed   By: Kellie Simmering DO   On: 07/24/2020 14:15     Medications:     Scheduled Medications: . amiodarone  200 mg Oral Daily  . atorvastatin  20 mg Oral q1800  . digoxin  0.125 mg Oral Daily  . folic acid  1 mg Oral Daily  . gabapentin  600 mg Oral BID  . heparin injection (subcutaneous)  5,000 Units Subcutaneous Q12H  . insulin aspart  0-9 Units Subcutaneous TID WC  . isosorbide-hydrALAZINE  1 tablet Oral TID  . lidocaine  1 patch Transdermal Q24H  . multivitamin with minerals  1 tablet Oral Daily  . senna-docusate  1 tablet Oral BID  . sodium chloride flush  3 mL Intravenous Q12H  . thiamine  100 mg Oral Daily   Or  . thiamine  100 mg Intravenous Daily    Infusions: . sodium chloride 250 mL (07/24/20 1718)  . magnesium sulfate bolus IVPB 1 g (07/25/20 1004)  . methocarbamol (ROBAXIN) IV      PRN Medications: acetaminophen **OR** acetaminophen, bisacodyl, diphenhydrAMINE, HYDROmorphone (DILAUDID) injection, loratadine, menthol-cetylpyridinium **OR** phenol, methocarbamol **OR** methocarbamol (ROBAXIN) IV, ondansetron **OR** ondansetron (ZOFRAN) IV, oxyCODONE-acetaminophen, polyethylene glycol, promethazine, senna-docusate, sodium chloride flush, traMADol   Assessment/Plan   1.  C4-C6 cord compression  S/P /p ACDF C4-6 on  07/24/2020  2. Hyponatremia:Recurrent.[id number] at admission. Hypervolemic hyponatremia in setting of excess fluid intake at home.  He has had tolvaptan x 2.  - Sodium 127 today restrict free water.    3. . Acute on chronic systolic CHF: Echo (2/09) EF 35%, moderately reduced RV fxn, moderate-severe MR, moderate TR. Nonischemic cardiomyopathy with nonobstructive CAD on 6/21 cath, probably due to substance abuse (ETOH, prior cocaine).  - Volume status stable. Restart home diuretic.  - Creatinine 1.6 today  -Continue digoxin 0.125 mg daily. Heart rate in the 60s.  -  Continue Bidil 1 tab tid.   4. Atrial fibrillation: Chronic. He has been off anticoagulation due toheavyETOH abuse and frequent falls.  - Rate controlled -Off Coreg with low HR in past.  - Without anticoagulation, DCCV is not an option. - If he quits drinking and improves stability, start Eliquis.  5. ETOH abuse: Ideally needs rehab program.Still drinking some at home, says he has cut back a lot.   6. PVCs: Amiodarone begun at last appointment due to frequent PVCs.   - Continue amiodarone.   7 . Syncope: Has had in the past, has fractured ribs.  Hard to know if this has been due to ETOH intoxication or arrhythmias.  He has been deemed not a candidate for ICD due to ETOH abuse.    Length of Stay: [place], NP  07/25/2020, 10:47 AM  Advanced Heart Failure Team Pager [phone] (M-F; 7a - 4p)  Please contact [facility] for night-coverage after hours (4p -7a ) and weekends on [email or web address]  Patient seen with NP, agree with the above note.   He had surgery yesterday with no complication.  No complaints today.  Na stable at 127.  Creatinine mildly higher at 1.6. Volume status difficult given c-collar.  - Will start him on torsemide 40 mg po daily, suspect this will be his home dose.  - No tolvaptan today, continue po fluid restriction.  - He needs to cut back significantly on fluid intake, including beer, at  home.  - Can add spironolactone 12.5 daily today.   Loralie Champagne 07/25/2020 2:42 PM

## 2020-07-26 LAB — GLUCOSE, CAPILLARY
Glucose-Capillary: 171 mg/dL — ABNORMAL HIGH (ref 70–99)
Glucose-Capillary: 135 mg/dL — ABNORMAL HIGH (ref 70–99)
Glucose-Capillary: 173 mg/dL — ABNORMAL HIGH (ref 70–99)

## 2020-07-26 LAB — BASIC METABOLIC PANEL
Anion gap: 9 (ref 5–15)
BUN: 43 mg/dL — ABNORMAL HIGH (ref 6–20)
CO2: 27 mmol/L (ref 22–32)
Calcium: 8.6 mg/dL — ABNORMAL LOW (ref 8.9–10.3)
Chloride: 96 mmol/L — ABNORMAL LOW (ref 98–111)
Creatinine, Ser: 1.7 mg/dL — ABNORMAL HIGH (ref 0.61–1.24)
GFR calc Af Amer: 50 mL/min — ABNORMAL LOW (ref >60)
GFR calc non Af Amer: 43 mL/min — ABNORMAL LOW (ref >60)
Glucose, Bld: 173 mg/dL — ABNORMAL HIGH (ref 70–99)
Potassium: 4.6 mmol/L (ref 3.5–5.1)
Sodium: 132 mmol/L — ABNORMAL LOW (ref 135–145)

## 2020-07-26 LAB — MAGNESIUM: Magnesium: 1.7 mg/dL (ref 1.7–2.4)

## 2020-07-26 NOTE — Progress Notes (Signed)
Physical Therapy Treatment Patient Details Name: Brad [name] MRN: [id number] DOB: 14-Nov-1961 Today's Date: 07/26/2020    History of Present Illness 58 y.o. male with medical history significant of diet-controlled diabetes mellitus, systolic CHF with ejection fraction of 25 to 30%, A. fib on Eliquis (s/p TEE and cardioversion), CKD stage III, chronic hyponatremia, recurrent falls, hypertension, tobacco abuse, alcohol abuse, cocaine abuse, depression/anxiety, hyperlipidemia presents to emergency department due to incr rt sided rib pain (fell ~1 month prior with anterolateral 5-6th rib fractures) and RLE myoclonic jerking. Also recently admitted with acute on chronic hyponatremia. EEG negative; MRI C4-C6 moderate cord mass effect with suspicion of abnormal cord signal (myelomalacia versus cord edema from recent fall). Required medical work-up prior to ACDF C4-6 on 07/24/2020     PT Comments    Pt with improved mobility today, ambulating good hallway distance with use of SPC. Per pt, he has a RW at home but it will not fit in his apartment. Pt requires cuing for maintaining precautions during mobility. Pt plans to d/c home with a different friend and roommate than originally planned, states he will have assist for mobility. Pt refuses SNF, PT updated plan to reflect HHPT and cane. Will continue to follow acutely.     Follow Up Recommendations  Home health PT;Supervision/Assistance - 24 hour     Equipment Recommendations  Cane    Recommendations for Other Services       Precautions / Restrictions Precautions Precautions: Cervical;Fall Precaution Booklet Issued: Yes (comment) Precaution Comments: reviewed cervical precautions, pt states "I have to turn my whole body when I want to look at something" as recall Required Braces or Orthoses: Cervical Brace Cervical Brace: At all times;Hard collar;Other (comment) (can be off for eating and shower per MD note)    Mobility  Bed Mobility Overal  bed mobility: Needs Assistance Bed Mobility: Rolling;Sit to Sidelying Rolling: Supervision       Sit to sidelying: Supervision General bed mobility comments: supervision for safety, pt performing log roll technique well.  Transfers Overall transfer level: Needs assistance Equipment used: None Transfers: Sit to/from Stand Sit to Stand: Min guard         General transfer comment: for safety only  Ambulation/Gait Ambulation/Gait assistance: Supervision Gait Distance (Feet): 300 Feet Assistive device: Straight cane Gait Pattern/deviations: Step-through pattern;Decreased stride length;Shuffle Gait velocity: decr   General Gait Details: supervision for safety, verbal cuing for upright posture, sequencing cane with gait.   Stairs             Wheelchair Mobility    Modified [name] (Stroke Patients Only)       Balance Overall balance assessment: History of Falls;Needs assistance Sitting-balance support: No upper extremity supported Sitting balance-Leahy Scale: Good     Standing balance support: Single extremity supported Standing balance-Leahy Scale: Fair                              Cognition Arousal/Alertness: Awake/alert Behavior During Therapy: WFL for tasks assessed/performed Overall Cognitive Status: No family/caregiver present to determine baseline cognitive functioning Area of Impairment: Attention;Memory;Following commands;Safety/judgement;Awareness;Problem solving                   Current Attention Level: Selective Memory: Decreased recall of precautions Following Commands: Follows one step commands consistently Safety/Judgement: Decreased awareness of safety;Decreased awareness of deficits Awareness: Emergent Problem Solving: Slow processing;Requires verbal cues General Comments: Pt upset because his roommate stopped paying rent, states he  is moving in with someone else when he leaves. Requires cuing for cervical precautions,  minimal recall of precautions.      Exercises      General Comments        Pertinent Vitals/Pain Pain Assessment: 0-10 Pain Score: 8  Pain Location: neck Pain Descriptors / Indicators: Grimacing;Operative site guarding;Sore Pain Intervention(s): Limited activity within patient's tolerance;Monitored during session;Repositioned    Home Living                      Prior Function            PT Goals (current goals can now be found in the care plan section) Acute Rehab PT Goals Patient Stated Goal: to be sure he heals fully PT Goal Formulation: With patient Time For Goal Achievement: 08/08/20 Potential to Achieve Goals: Good Progress towards PT goals: Progressing toward goals    Frequency    Min 3X/week      PT Plan Discharge plan needs to be updated;Equipment recommendations need to be updated    Co-evaluation              AM-PAC PT "6 Clicks" Mobility   Outcome Measure  Help needed turning from your back to your side while in a flat bed without using bedrails?: A Little Help needed moving from lying on your back to sitting on the side of a flat bed without using bedrails?: A Little Help needed moving to and from a bed to a chair (including a wheelchair)?: A Little Help needed standing up from a chair using your arms (e.g., wheelchair or bedside chair)?: A Little Help needed to walk in hospital room?: A Little Help needed climbing 3-5 steps with a railing? : A Lot 6 Click Score: 17    End of Session Equipment Utilized During Treatment: Gait belt;Cervical collar Activity Tolerance: No increased pain Patient left: with call bell/phone within reach;with bed alarm set;in bed Nurse Communication: Mobility status PT Visit Diagnosis: Other abnormalities of gait and mobility (R26.89);History of falling (Z91.81)     Time: 2500-3704 PT Time Calculation (min) (ACUTE ONLY): 17 min  Charges:  $Gait Training: 8-22 mins                     Brad [name] E,  PT [place] Pager [phone]  Office [phone]    Brad [name] D Brad [name] 07/26/2020, 5:38 PM

## 2020-07-26 NOTE — Op Note (Signed)
PREOP DIAGNOSIS:  1. Cervical spondylosis with myelopathy 2. Lower extremity spasticity due to #1 3. C4-5, C5-6 severe stenosis with cord signal change  POSTOP DIAGNOSIS: Same  PROCEDURE: 1.  Anterior cervical decompression and fusion, C4-5, C5-6 2.  Placement of anterior biomechanical device, C4-5, C5-6, K2M [place] interbody device 3.  Use of local autograft, C4-5, C5-6 4.  Anterior cervical instrumentation, C4-6, K2M [place] plate 5.  Use of microscope for microdissection and decompression of neural elements 6.  SSEP neuromonitoring  SURGEON: Dr. Elwin Sleight, DO  ASSISTANT: Dr. Consuella Lose, MD  ANESTHESIA: General Endotracheal  EBL: 30cc  SPECIMENS: None  DRAINS: Hemovac  COMPLICATIONS: None  CONDITION: Hemodynamically stable to PACU  HISTORY: Brad [name] is a 59 y.o. y.o. male with a history of CHF, alcohol abuse, hepatitis C, drug abuse that presented to the emergency department with chest and rib pain as well as bilateral lower extremity, right greater than left spasticity.  Neurology was consulted and an MRI was performed of the cervical spine due to hyperreflexia and numbness and tingling in his hands and feet.  He also complained of numbness and tingling in his hands and feet that was constant for months as well as neck pain. MRI revealed C4-5, C5-C6 significant stenosis with cord compression and cord signal change at both levels.  Due to the significant cord compression and symptoms of myelopathy, I recommended he undergo a C4-C6 anterior cervical decompression and fusion in order to prevent further neurologic decline.  I discussed with him the likelihood of some type of improvement in his numbness and tingling however there was no guarantee.  I discussed the goal of the surgery was to prevent further neurologic decline and remove the pressure off his spinal cord.  Risks and benefits included but not limited to heart attack, stroke, death, permanent or temporary  neurologic dysfunction, worsening pain, bleeding and infection were all discussed and agreed upon.  PROCEDURE IN DETAIL: The patient was brought to the operating room at [facility] and transferred to the operative table. After induction of general anesthesia, the patient was positioned on the operative table in the supine position with all pressure points meticulously padded.  Prepositioning SSEP signals were obtained and noted to be monitorable.  Using padding under his shoulders and neck, his cervical spine was positioned in a slightly extended position.  Post positional SSEP signals were obtained and noted to be stable.  The skin of the neck was then prepped and draped in the usual sterile fashion.  After timeout was conducted, the skin incision was then made sharply with a 10 blade and Bovie electrocautery was used to dissect the subcutaneous tissue until the platysma was identified. The platysma was then divided and undermined. The sternocleidomastoid muscle was then identified and, utilizing natural fascial planes in the neck, the prevertebral fascia was identified and the carotid sheath was retracted laterally and the trachea and esophagus retracted medially. Using fluoroscopy, the correct disc space was identified. Bovie electrocautery was used to dissect in the subperiosteal plane and elevate the bilateral longus coli muscles. Self-retaining retractors were then placed.  Distraction pins were placed in the C4-C5 vertebral bodies.  At this point, the microscope was draped and brought into the field, and the remainder of the case was done under the microscope using microdissecting technique.  The C4-5 disc space was incised sharply and rongeurs were use to initially complete a discectomy.  The distractor was placed over the distraction pins and the interspace was placed  under distraction.  The high-speed drill was then used to complete discectomy until the posterior annulus was identified and  removed and the posterior longitudinal ligament was identified. Using a nerve hook, the PLL was elevated, and Kerrison rongeurs were used to remove the posterior longitudinal ligament and the ventral thecal sac was identified. Using a combination of curettes and rongeurs, complete decompression of the thecal sac and exiting nerve roots at this level was completed, and verified using micro-nerve hook.  The epidural space was copiously irrigated and hemostasis was achieved with Surgi-Flo.  Having completed the decompression, attention was turned to placement of the intervertebral device.  The disc space was taken out of distraction.  Trial spacers were used to select an appropriate sized graft.  Using the high-speed drill local autograft was harvested and packed laterally into the uncovertebral joints.  This intervertebral spacer was then filled with morcellized allograft and inserted. The distraction pin was removed from C4, surgiflo was used for boney hemostasis and the pin was then placed into the C6 vertebral body. Bovie electrocautery was used to dissect in the subperiosteal plane and elevate the bilateral longus coli muscles at C5-6. Self-retaining retractors were then placed.  The C5-6 disc space was incised sharply and rongeurs were use to initially complete a discectomy.  The distractor was placed over the distraction pins and the interspace was placed under distraction.  The high-speed drill was then used to complete discectomy until the posterior annulus was identified and removed and the posterior longitudinal ligament was identified. Using a nerve hook, the PLL was elevated, and Kerrison rongeurs were used to remove the posterior longitudinal ligament and the ventral thecal sac was identified. Using a combination of curettes and rongeurs, complete decompression of the thecal sac and exiting nerve roots at this level was completed, and verified using micro-nerve hook by passing it freely into both  foramina.  The epidural space was copiously irrigated and hemostasis was achieved with Surgi-Flo.  Having completed the decompression, attention was turned to placement of the intervertebral device.  The disc space was taken out of distraction.  Trial spacers were used to select an appropriate sized graft.  Using the high-speed drill local autograft was harvested and packed laterally into the uncovertebral joints.  The intervertebral graft was then filled with morcellized allograft and inserted. The distraction pins were removed and hemostasis was achieved with surgiflo.   After placement of the intervertebral devices, the above anterior cervical plate was selected, and placed across the interspaces. Using a high-speed drill, the cortex of the cervical vertebral bodies was punctured, and screws inserted in the C4, C5, C6 levels. Final fluoroscopic images in AP and lateral projections were taken to confirm good hardware placement. The retractors were removed and the wound was irrigated.   At this point, after all counts were verified to be correct, meticulous hemostasis was secured using a combination of bipolar electrocautery and passive hemostatics. The wound was then reexamined and there was no accumulation of bleeding anterior to the plate or behind the esophagus. The wound had been irrigated copiously and suctioned dry. The drained was tunneled out lateraly and placed in the prevertebral space. The wound was closed with staples. Sterile dressing was applied. The drapes were then taken down. There was no change noted in the SSEP neuromonitoring throughout the entirety of the case.   The patient tolerated the procedure well and was extubated in the room and taken to the postanesthesia care unit in stable condition.

## 2020-07-26 NOTE — TOC Progression Note (Signed)
Transition of Care [facility]) - Progression Note    Patient Details  Name: Dameir Gentzler MRN: [id number] Date of Birth: [birth year]  [facility] CM/SW Contact  Pollie Friar, RN Phone Number: 07/26/2020, 12:09 PM  Clinical Narrative:    Patient plans of moving into another apartment with a friend. He states its the same apartment building. He is refusing SNF rehab. CM is unable to arrange [facility] services under his medicaid and pt is aware.  PT says he will need a walker. Pt wants cane, but states he has a walker and 3in 1 at home. CM encouraged him to use the walker.  Pt may need transportation assist to get home. CM following.   Expected Discharge Plan: Home/Self Care Barriers to Discharge: Continued Medical Work up  Expected Discharge Plan and Services Expected Discharge Plan: Home/Self Care In-house Referral: Clinical Social Work   Post Acute Care Choice: [place] Living arrangements for the past 2 months: Apartment Expected Discharge Date:  (unknown)                                     Social Determinants of Health (SDOH) Interventions    Readmission Risk Interventions Readmission Risk Prevention Plan 07/23/2020 06/13/2020 06/10/2020  Transportation Screening Complete Complete Complete  PCP or Specialist Appt within 5-7 Days - - -  Home Care Screening - - -  Medication Review (RN CM) - - -  Medication Review (RN Transport planner) Referral to Pharmacy Complete Complete  PCP or Specialist appointment within 3-5 days of discharge Complete Complete (No Data)  PCP/Specialist Appt Not Complete comments - - -  HRI or Home Care Consult Complete Complete -  [place] or Home Care Consult Pt Refusal Comments - - -  SW Recovery Care/Counseling Consult Complete Complete Complete  Palliative Care Screening Not Applicable Not Applicable Not Applicable  Skilled Nursing Facility Complete Complete Not Applicable  Some recent data might be hidden

## 2020-07-26 NOTE — Progress Notes (Signed)
[facility]  PROGRESS NOTE  Roxie Gueye UKG:[id number] DOB: [birth year] DOA: 07/18/2020 PCP: Charlott Rakes, MD   Brief HPI:   59 year old male with history of chronic systolic heart failure, EF 35 to 40% on echo from 04/03/2020, hypertension, chronic hyponatremia, alcohol abuse, diabetes mellitus type 2, history of hepatitis C with liver cirrhosis, depression, recent multiple hospitalization with rib pain/chest pain, hyponatremia came with severe right rib pain along with continued jerking of right leg.  In the ED his sodium was found to be 116 and he was started on IV fluids.  Nephrology was consulted.  Chest x-ray showed mildly displaced fracture of the anterolateral right fifth rib and nondisplaced anterior right severe right sixth rib fracture.  Neurology was consulted for question of myoclonic jerks.  EEG did not show seizure.  MRI of cervical spine showed severe spinal stenosis at C4-C5, C5-C6 level with moderate cord mass-effect.  Neurosurgery was consulted.  Patient was transferred to [facility].   Subjective   Patient seen and examined, wants to go home with physical therapy.  Refuses to go to skilled nursing facility for rehab.   Assessment/Plan:     1. Acute on chronic hyponatremia-patient has chronic hyponatremia with baseline sodium 1 27-1 30.  He presented with sodium of 116.  Nephrology was consulted.  Patient started on tolvaptan.  Today sodium has improved to 132.  Cardiology has started patient on torsemide 40 mg p.o. daily.  2. Severe cervical stenosis at C4-5, C5-6-moderate cord mass-effect.  He presented with abnormal jerking movements of extremities..  Neurosurgery was consulted and patient underwent ACDF.  EEG did not show seizure activity.  Neurology has signed off.  Patient to follow-up with neurosurgery in 10 days for staple removal. 3. Right rib fracture-patient presents with right rib pain due to right hip fracture from a fall more than a month ago.   Chest x-ray showed mildly displaced fracture of the anterolateral right fifth rib.  Nondisplaced anterior severe right sixth rib fracture.  Continue lidocaine patch.  Continue gabapentin  600 mg p.o. twice daily.  Continue IV Dilaudid as needed, Percocet as needed. 4. Hypertension-blood pressure is stable, continue Aldactone, torsemide. 5. Paroxysmal atrial fibrillation-continue amiodarone, aspirin was discontinued by neurosurgery for possible need for surgical intervention.  Heart rate is controlled.  Aspirin can be restarted in 10 days from surgery as per neurosurgery recommendation. 6. Alcohol abuse-monitor signs of alcohol withdrawal.  Continue CIWA protocol. 7. Chronic HCV with liver cirrhosis-LFTs are stable on presentation.  Follow-up as outpatient   Scheduled medications:   . amiodarone  200 mg Oral Daily  . atorvastatin  20 mg Oral q1800  . digoxin  0.125 mg Oral Daily  . folic acid  1 mg Oral Daily  . gabapentin  600 mg Oral BID  . heparin injection (subcutaneous)  5,000 Units Subcutaneous Q12H  . insulin aspart  0-9 Units Subcutaneous TID WC  . isosorbide-hydrALAZINE  1 tablet Oral TID  . lidocaine  1 patch Transdermal Q24H  . multivitamin with minerals  1 tablet Oral Daily  . senna-docusate  1 tablet Oral BID  . sodium chloride flush  3 mL Intravenous Q12H  . spironolactone  12.5 mg Oral Daily  . thiamine  100 mg Oral Daily   Or  . thiamine  100 mg Intravenous Daily  . torsemide  40 mg Oral Daily         CBG: Recent Labs  Lab 07/24/20 2306 07/25/20 0610 07/25/20 1212 07/25/20 1540 07/25/20 2130  GLUCAP 309* 167* 177* 194* 186*    SpO2: 100 % O2 Flow Rate (L/min): 2 L/min    CBC: Recent Labs  Lab 07/20/20 1201 07/24/20 0505 07/24/20 1745 07/25/20 0047  WBC 3.3* 3.9* 5.1 6.1  NEUTROABS  --  2.6  --  5.5  HGB 10.7* 9.6* 11.9* 10.7*  HCT 31.8* 29.2* 36.6* 32.2*  MCV 86.2 88.2 89.1 89.7  PLT 177 156 203 465    Basic Metabolic Panel: Recent Labs   Lab 07/22/20 0314 07/22/20 0314 07/23/20 0422 07/23/20 0422 07/24/20 0505 07/24/20 0950 07/24/20 1745 07/25/20 0047 07/26/20 0231  NA 127*   < > 127*   < > 125* 126* 130* 127* 132*  K 4.1   < > 4.1  --  4.4 4.3  --  4.4 4.6  CL 93*   < > 93*  --  92* 93*  --  89* 96*  CO2 27   < > 27  --  26 27  --  24 27  GLUCOSE 160*   < > 174*  --  147* 130*  --  195* 173*  BUN 25*   < > 25*  --  29* 28*  --  33* 43*  CREATININE 1.58*   < > 1.37*   < > 1.48* 1.44* 1.64* 1.63* 1.70*  CALCIUM 8.1*   < > 8.3*  --  8.3* 8.3*  --  8.5* 8.6*  MG 1.7  --  2.0  --  1.8  --   --  1.6* 1.7   < > = values in this interval not displayed.     Liver Function Tests: Recent Labs  Lab 07/20/20 0817 07/21/20 0240  AST 25 24  ALT 17 17  ALKPHOS 52 55  BILITOT 0.6 0.5  PROT 5.8* 5.6*  ALBUMIN 2.5* 2.4*     Antibiotics: Anti-infectives (From admission, onward)   Start     Dose/Rate Route Frequency Ordered Stop   07/24/20 1930  ceFAZolin (ANCEF) IVPB 2g/100 mL premix        2 g 200 mL/hr over 30 Minutes Intravenous Every 8 hours 07/24/20 1639 07/25/20 1234   07/24/20 1030  bacitracin 50,000 Units in sodium chloride 0.9 % 500 mL irrigation  Status:  Discontinued          As needed 07/24/20 1120 07/24/20 1436   07/24/20 0600  ceFAZolin (ANCEF) IVPB 2g/100 mL premix        2 g 200 mL/hr over 30 Minutes Intravenous On call to O.R. 07/23/20 1701 07/24/20 1135       DVT prophylaxis: Heparin  Code Status: Full code  Family Communication: No family at bedside    Status is: Inpatient  Dispo: The patient is from: Home              Anticipated d/c is to: Skilled nursing facility              Anticipated d/c date is: 07/27/2020              Patient currently not medically stable for discharge  Barrier to discharge-awaiting PT evaluation, pain control       Consultants:  Neurosurgery  Cardiology  Procedures:  Anterior cervical discectomy and fusion   Objective   Vitals:    07/25/20 1936 07/26/20 0022 07/26/20 0349 07/26/20 0850  BP: 136/79 128/78 114/76 119/90  Pulse: 77 76 65 87  Resp: 18 18 18 20   Temp: 98.2 F (36.8 C) 97.9 F (36.6  C) 98.4 F (36.9 C) 97.6 F (36.4 C)  TempSrc: Oral Oral Oral Oral  SpO2: 99% 98% 99% 100%  Weight:      Height:        Intake/Output Summary (Last 24 hours) at 07/26/2020 1156 Last data filed at 07/26/2020 1007 Gross per 24 hour  Intake 340.28 ml  Output 3500 ml  Net -3159.72 ml    08/03 1901 - 08/05 0700 In: 1007.3 [P.O.:720; I.V.:187.3] Out: 2751 [Urine:4700; Drains:35]  Filed Weights   07/23/20 0411 07/24/20 0556 07/25/20 0335  Weight: 82.2 kg 82.9 kg 82.5 kg    Physical Examination:   General-appears in no acute distress, neck collar in place Heart-S1-S2, regular, no murmur auscultated Lungs-clear to auscultation bilaterally, no wheezing or crackles auscultated Abdomen-soft, nontender, no organomegaly Extremities-no edema in the lower extremities Neuro-alert, oriented x3, no focal deficit noted   Data Reviewed:   Recent Results (from the past 240 hour(s))  SARS Coronavirus 2 by RT PCR (hospital order, performed in [facility] hospital lab) Nasopharyngeal Urine, Clean Catch     Status: None   Collection Time: 07/18/20 12:23 PM   Specimen: Urine, Clean Catch; Nasopharyngeal  Result Value Ref Range Status   SARS Coronavirus 2 NEGATIVE NEGATIVE Final    Comment: (NOTE) SARS-CoV-2 target nucleic acids are NOT DETECTED.  The SARS-CoV-2 RNA is generally detectable in upper and lower respiratory specimens during the acute phase of infection. The lowest concentration of SARS-CoV-2 viral copies this assay can detect is 250 copies / mL. A negative result does not preclude SARS-CoV-2 infection and should not be used as the sole basis for treatment or other patient management decisions.  A negative result may occur with improper specimen collection / handling, submission of specimen other than  nasopharyngeal swab, presence of viral mutation(s) within the areas targeted by this assay, and inadequate number of viral copies (<250 copies / mL). A negative result must be combined with clinical observations, patient history, and epidemiological information.  Fact Sheet for Patients:   StrictlyIdeas.no  Fact Sheet for Healthcare Providers: BankingDealers.co.za  This test is not yet approved or  cleared by the [place] FDA and has been authorized for detection and/or diagnosis of SARS-CoV-2 by FDA under an Emergency Use Authorization (EUA).  This EUA will remain in effect (meaning this test can be used) for the duration of the COVID-19 declaration under Section 564(b)(1) of the Act, 21 U.S.C. section 360bbb-3(b)(1), unless the authorization is terminated or revoked sooner.  Performed at [facility], [place] [address], [place], [place] [address]   Surgical pcr screen     Status: Abnormal   Collection Time: 07/23/20  5:17 PM   Specimen: Nasal Mucosa; Nasal Swab  Result Value Ref Range Status   MRSA, PCR NEGATIVE NEGATIVE Final   Staphylococcus aureus POSITIVE (A) NEGATIVE Final    Comment: (NOTE) The Xpert SA Assay (FDA approved for NASAL specimens in patients 21 years of age and older), is one component of a comprehensive surveillance program. It is not intended to diagnose infection nor to guide or monitor treatment. Performed at [facility], [place] [address] [place], [place] [address]      BNP (last 3 results) Recent Labs    05/29/20 0430 06/21/20 0626 06/30/20 0657  BNP 678.3* 584.3* 656.4*     Studies:  DG Cervical Spine 2-3 Views  Result Date: 07/24/2020 CLINICAL DATA:  Provided history: Surgery, elective. C4-6 ACDF. Fluoroscopy time 8 seconds, 0.35 mGy. EXAM: CERVICAL  SPINE - 2-3 VIEW; DG C-ARM 1-60 MIN COMPARISON:  Cervical spine MRI 07/18/2020 FINDINGS: PA and lateral view  fluoroscopic images of the cervical spine are submitted, two images total. Sequela of interval C4-C6 ACDF. No unexpected finding. Partially visualized support tubes. IMPRESSION: Two intraoperative fluoroscopic images of the cervical spine demonstrating interval C4-C6 ACDF. No unexpected finding. Electronically Signed   By: Kellie Simmering DO   On: 07/24/2020 14:15   DG C-Arm 1-60 Min  Result Date: 07/24/2020 CLINICAL DATA:  Provided history: Surgery, elective. C4-6 ACDF. Fluoroscopy time 8 seconds, 0.35 mGy. EXAM: CERVICAL SPINE - 2-3 VIEW; DG C-ARM 1-60 MIN COMPARISON:  Cervical spine MRI 07/18/2020 FINDINGS: PA and lateral view fluoroscopic images of the cervical spine are submitted, two images total. Sequela of interval C4-C6 ACDF. No unexpected finding. Partially visualized support tubes. IMPRESSION: Two intraoperative fluoroscopic images of the cervical spine demonstrating interval C4-C6 ACDF. No unexpected finding. Electronically Signed   By: Kellie Simmering DO   On: 07/24/2020 14:15       Oswald Hillock   [facility] If 7PM-7AM, please contact night-coverage at [email or web address], Office  [phone]   07/26/2020, 11:56 AM  LOS: 8 days

## 2020-07-26 NOTE — Progress Notes (Addendum)
Patient ID: Brad [name], male   DOB: November 06, 1961, 59 y.o.   MRN: [id number]     Advanced Heart Failure Rounding Note  PCP-Cardiologist: [name], MD  HF MD: Dr Aundra Dubin  Subjective:    07/24/20  S/P  s/p ACDF C4-6   Feeling better. RUE strength improved.   Na much improved, 132 today.  SCr trending up, 1.3>>1.4>>1.6>>1.7. K 4.6  No cardiac complaints.    Objective:   Weight Range: 82.5 kg Body mass index is 25.37 kg/m.   Vital Signs:   Temp:  [97.6 F (36.4 C)-98.9 F (37.2 C)] 98.4 F (36.9 C) (08/05 0349) Pulse Rate:  [65-77] 65 (08/05 0349) Resp:  [16-20] 18 (08/05 0349) BP: (113-143)/(69-96) 114/76 (08/05 0349) SpO2:  [98 %-100 %] 99 % (08/05 0349) Last BM Date: 07/23/20  Weight change: Filed Weights   07/23/20 0411 07/24/20 0556 07/25/20 0335  Weight: 82.2 kg 82.9 kg 82.5 kg    Intake/Output:   Intake/Output Summary (Last 24 hours) at 07/26/2020 0810 Last data filed at 07/26/2020 0500 Gross per 24 hour  Intake 580.28 ml  Output 3100 ml  Net -2519.72 ml      Physical Exam   General:   Well appearing, sitting up in bed w/ C-collar. No resp difficulty HEENT: Poor dentition, otherwise normal Neck: Cervical Collar in place, unable to assess JVD. Carotids 2+ bilat; no bruits. No lymphadenopathy or thryomegaly appreciated. Cor: PMI nondisplaced. Irregular rate & rhythm. No rubs, gallops or murmurs. Lungs: clear. No wheezing  Abdomen: soft, nontender, nondistended. No hepatosplenomegaly. No bruits or masses. Good bowel sounds. Extremities: no cyanosis, clubbing, rash, edema Neuro: alert & orientedx3, cranial nerves grossly intact. moves all 4 extremities w/o difficulty. Affect pleasant   Telemetry    Chronic A fib 60-70s, occasional PVCs <10/hr    Labs    CBC Recent Labs    07/24/20 0505 07/24/20 0505 07/24/20 1745 07/25/20 0047  WBC 3.9*   < > 5.1 6.1  NEUTROABS 2.6  --   --  5.5  HGB 9.6*   < > 11.9* 10.7*  HCT 29.2*   < > 36.6*  32.2*  MCV 88.2   < > 89.1 89.7  PLT 156   < > 203 188   < > = values in this interval not displayed.   Basic Metabolic Panel Recent Labs    07/25/20 0047 07/26/20 0231  NA 127* 132*  K 4.4 4.6  CL 89* 96*  CO2 24 27  GLUCOSE 195* 173*  BUN 33* 43*  CREATININE 1.63* 1.70*  CALCIUM 8.5* 8.6*  MG 1.6* 1.7   Liver Function Tests No results for input(s): AST, ALT, ALKPHOS, BILITOT, PROT, ALBUMIN in the last 72 hours. No results for input(s): LIPASE, AMYLASE in the last 72 hours. Cardiac Enzymes No results for input(s): CKTOTAL, CKMB, CKMBINDEX, TROPONINI in the last 72 hours.  BNP: BNP (last 3 results) Recent Labs    05/29/20 0430 06/21/20 0626 06/30/20 0657  BNP 678.3* 584.3* 656.4*    ProBNP (last 3 results) No results for input(s): PROBNP in the last 8760 hours.   D-Dimer No results for input(s): DDIMER in the last 72 hours. Hemoglobin A1C No results for input(s): HGBA1C in the last 72 hours. Fasting Lipid Panel No results for input(s): CHOL, HDL, LDLCALC, TRIG, CHOLHDL, LDLDIRECT in the last 72 hours. Thyroid Function Tests No results for input(s): TSH, T4TOTAL, T3FREE, THYROIDAB in the last 72 hours.  Invalid input(s): FREET3  Other results:  Imaging    No results found.   Medications:     Scheduled Medications: . amiodarone  200 mg Oral Daily  . atorvastatin  20 mg Oral q1800  . digoxin  0.125 mg Oral Daily  . folic acid  1 mg Oral Daily  . gabapentin  600 mg Oral BID  . heparin injection (subcutaneous)  5,000 Units Subcutaneous Q12H  . insulin aspart  0-9 Units Subcutaneous TID WC  . isosorbide-hydrALAZINE  1 tablet Oral TID  . lidocaine  1 patch Transdermal Q24H  . multivitamin with minerals  1 tablet Oral Daily  . senna-docusate  1 tablet Oral BID  . sodium chloride flush  3 mL Intravenous Q12H  . spironolactone  12.5 mg Oral Daily  . thiamine  100 mg Oral Daily   Or  . thiamine  100 mg Intravenous Daily  . torsemide  40 mg Oral  Daily    Infusions: . sodium chloride 10 mL/hr at 07/25/20 1500  . methocarbamol (ROBAXIN) IV      PRN Medications: acetaminophen **OR** acetaminophen, bisacodyl, diphenhydrAMINE, HYDROmorphone (DILAUDID) injection, loratadine, menthol-cetylpyridinium **OR** phenol, methocarbamol **OR** methocarbamol (ROBAXIN) IV, ondansetron **OR** ondansetron (ZOFRAN) IV, oxyCODONE-acetaminophen, polyethylene glycol, promethazine, senna-docusate, sodium chloride flush, traMADol   Assessment/Plan   1.  C4-C6 cord compression  -S/P /p ACDF C4-6 on 07/24/2020  2. Hyponatremia:Recurrent.[id number] at admission. Hypervolemic hyponatremia in setting of excess fluid intake at home.  He has had tolvaptan x 2.  - Sodium 132 today  - Continue to restrict free water.    3. Acute on chronic systolic CHF: Echo (2/97) EF 35%, moderately reduced RV fxn, moderate-severe MR, moderate TR. Nonischemic cardiomyopathy with nonobstructive CAD on 6/21 cath, probably due to substance abuse (ETOH, prior cocaine).  - Volume status assessment difficult w/ c-collar, but wt still slightly above baseline. - Creatinine trending up, 1.7 today  - Continue Torsemide, may need dose increase to 60 mg daily  - Continue spironolactone 12.5 mg daily  -Continue digoxin 0.125 mg daily.  -Continue Bidil 1 tab tid.   4. Atrial fibrillation: Chronic. He has been off anticoagulation due toheavyETOH abuse and frequent falls.  - Rate controlled -Off Coreg with low HR in past.  - Without anticoagulation, DCCV is not an option. - If he quits drinking and improves stability, start Eliquis.  5. ETOH abuse: Ideally needs rehab program.Still drinking some at home, says he has cut back a lot.   6. PVCs: Amiodarone begun at last appointment due to frequent PVCs.   - Continue amiodarone.   7 . Syncope: Has had in the past, has fractured ribs.  Hard to know if this has been due to ETOH intoxication or arrhythmias.  He has been deemed  not a candidate for ICD due to ETOH abuse.    Length of Stay: [address], PA-C  07/26/2020, 8:10 AM  Advanced Heart Failure Team Pager [phone] (M-F; 7a - 4p)  Please contact [facility] for night-coverage after hours (4p -7a ) and weekends on [email or web address]  Patient seen with PA, agree with the above note .  Stable today from cardiac standpoint with mild rise in creatinine.  Would continue current meds, will not increase torsemide for now, keep at 40 mg daily.  Sodium is improved, continue fluid restriction.   Loralie Champagne 07/26/2020

## 2020-07-26 NOTE — Plan of Care (Signed)
  Problem: Education: Goal: Knowledge of General Education information will improve Description Including pain rating scale, medication(s)/side effects and non-pharmacologic comfort measures Outcome: Progressing   

## 2020-07-26 NOTE — Progress Notes (Signed)
   Providing Compassionate, Quality Care - Together  NEUROSURGERY PROGRESS NOTE   S: No issues overnight. Still significantly improved from preop symptoms  O: EXAM:  BP 132/86 (BP Location: Right Arm)   Pulse 87   Temp 98 F (36.7 C) (Oral)   Resp 20   Ht 5\' 11"  (1.803 m)   Wt 82.5 kg   SpO2 100%   BMI 25.37 kg/m   Awake, alert, oriented  CNsgrossly intact  Full strength B UE/LE Incision c/d/i Neck soft Trachea midline NLB Collar in place  ASSESSMENT:  59 y.o. male with  1. 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 C4-C6 severe cord compression with cord signal change -s/p ACDF C4-6 on 07/24/2020   PLAN: -collar, ok to have off for eating and showering - pt/ot - pain control - neuro checks q4h - dvt ppx w sqh ok - if needs aspirin can restart in 10 days from surgery - will need f/u with me in 10 days for staple removal  - ok from my standpoint for d/c home/rehab    Thank you for allowing me to participate in this patient's care.  Please do not hesitate to call with questions or concerns.   Elwin Sleight, [facility] Cell: [phone]

## 2020-07-27 LAB — GLUCOSE, CAPILLARY
Glucose-Capillary: 179 mg/dL — ABNORMAL HIGH (ref 70–99)
Glucose-Capillary: 157 mg/dL — ABNORMAL HIGH (ref 70–99)

## 2020-07-27 LAB — MAGNESIUM: Magnesium: 1.6 mg/dL — ABNORMAL LOW (ref 1.7–2.4)

## 2020-07-27 LAB — SODIUM: Sodium: 131 mmol/L — ABNORMAL LOW (ref 135–145)

## 2020-07-27 MED ORDER — GABAPENTIN 300 MG PO CAPS: 600.0000 mg | ORAL_CAPSULE | Freq: Two times a day (BID) | ORAL | 2 refills | Status: DC

## 2020-07-27 MED ORDER — ISOSORB DINITRATE-HYDRALAZINE 20-37.5 MG PO TABS: 1.0000 | ORAL_TABLET | Freq: Three times a day (TID) | ORAL | 2 refills | Status: DC

## 2020-07-27 MED ORDER — OXYCODONE-ACETAMINOPHEN 7.5-325 MG PO TABS: 1.0000 | ORAL_TABLET | ORAL | 0 refills | Status: DC | PRN

## 2020-07-27 MED ORDER — THIAMINE HCL 100 MG PO TABS: 100.0000 mg | ORAL_TABLET | Freq: Every day | ORAL | 2 refills | Status: DC

## 2020-07-27 MED ORDER — POLYETHYLENE GLYCOL 3350 17 G PO PACK: 17.0000 g | PACK | Freq: Every day | ORAL | 0 refills | Status: DC | PRN

## 2020-07-27 MED ORDER — DIGOXIN 125 MCG PO TABS: 0.1250 mg | ORAL_TABLET | Freq: Every day | ORAL | 3 refills | Status: DC

## 2020-07-27 MED ORDER — SPIRONOLACTONE 25 MG PO TABS: 12.5000 mg | ORAL_TABLET | Freq: Every day | ORAL | 2 refills | Status: DC

## 2020-07-27 MED ORDER — MAGNESIUM SULFATE 2 GM/50ML IV SOLN
2.0000 g | Freq: Once | INTRAVENOUS | Status: AC
  Administered 2020-07-27: 2 g via INTRAVENOUS
  Filled 2020-07-27: qty 50

## 2020-07-27 MED ORDER — TORSEMIDE 20 MG PO TABS: 40.0000 mg | ORAL_TABLET | Freq: Every day | ORAL | 2 refills | Status: DC

## 2020-07-27 MED ORDER — ASPIRIN EC 81 MG PO TBEC
81.0000 mg | DELAYED_RELEASE_TABLET | Freq: Every day | ORAL | 3 refills | Status: DC
Start: 2020-08-03 — End: 2020-09-04

## 2020-07-27 MED FILL — GABAPENTIN 300 MG CAPSULE: 300 | 15 days supply | Qty: 60 | Fill #0

## 2020-07-27 MED FILL — TORSEMIDE 20 MG TABLET: 20 | 15 days supply | Qty: 30 | Fill #0

## 2020-07-27 MED FILL — BIDIL 20-37.5 MG TABS: 20-37.5 | 30 days supply | Qty: 90 | Fill #0

## 2020-07-27 MED FILL — DIGOXIN 0.125 MG TABLET: 125 | 30 days supply | Qty: 30 | Fill #0

## 2020-07-27 MED FILL — VITAMIN B-1 100 MG TABS: 100 | 30 days supply | Qty: 30 | Fill #0

## 2020-07-27 MED FILL — POLYETHYLENE GLYCOL 3350 PO: 17 | 14 days supply | Qty: 238 | Fill #0

## 2020-07-27 MED FILL — SPIRONOLACTONE 25 MG TABLET: 25 | 60 days supply | Qty: 30 | Fill #0

## 2020-07-27 MED FILL — OXYCODONE-APAP 7.5-325MG: 7.5-325 | 5 days supply | Qty: 30 | Fill #0

## 2020-07-27 MED FILL — ASPIRIN LOW DOSE 81 MG TBEC: 81 | 90 days supply | Qty: 90 | Fill #0

## 2020-07-27 NOTE — Discharge Summary (Addendum)
Physician Discharge Summary  Brad [name] WCH:[id number] DOB: 1961-09-24 DOA: 07/18/2020  PCP: Charlott Rakes, MD  Admit date: 07/18/2020 Discharge date: 07/27/2020  Time spent: 50* minutes  Recommendations for Outpatient Follow-up:  Follow-up neurosurgery in 10 days Follow-up cardiology as outpatient Follow-up PCP in 2 weeks  Discharge Diagnoses:  Principal Problem:   Hyponatremia Active Problems:   History of drug abuse ([place])   GERD (gastroesophageal reflux disease)   Chronic hepatitis C with cirrhosis ([place])   Essential hypertension   DM type 2 (diabetes mellitus, type 2) ([facility])   Chronic atrial fibrillation   Hyponatremia   Chronic systolic CHF (congestive heart failure) ([place])   Discharge Condition: Stable  Diet recommendation: Heart healthy diet  Filed Weights   07/23/20 0411 07/24/20 0556 07/25/20 0335  Weight: 82.2 kg 82.9 kg 82.5 kg    History of present illness:  59 year old male with history of chronic systolic heart failure, EF 35 to 40% on echo from 04/03/2020, hypertension, chronic hyponatremia, alcohol abuse, diabetes mellitus type 2, history of hepatitis C with liver cirrhosis, depression, recent multiple hospitalization with rib pain/chest pain, hyponatremia came with severe right rib pain along with continued jerking of right leg.  In the ED his sodium was found to be 116 and he was started on IV fluids.  Nephrology was consulted.  Chest x-ray showed mildly displaced fracture of the anterolateral right fifth rib and nondisplaced anterior right severe right sixth rib fracture.  Neurology was consulted for question of myoclonic jerks.  EEG did not show seizure.  MRI of cervical spine showed severe spinal stenosis at C4-C5, C5-C6 level with moderate cord mass-effect.  Neurosurgery was consulted.  Patient was transferred to [facility] Course:  1. *Acute on chronic hyponatremia-patient has chronic hyponatremia with baseline sodium 1 27-1 30.  He  presented with sodium of 116.  Nephrology was consulted.  Patient started on tolvaptan.  Today sodium has improved to 132.  Cardiology has started patient on torsemide 40 mg p.o. daily.  2. Severe cervical stenosis at C4-5, C5-6-moderate cord mass-effect.  He presented with abnormal jerking movements of extremities..  Neurosurgery was consulted and patient underwent ACDF.  EEG did not show seizure activity.  Neurology has signed off.  Patient to follow-up with neurosurgery in 10 days for staple removal. 3. Right rib fracture-patient presents with right rib pain due to right hip fracture from a fall more than a month ago.  Chest x-ray showed mildly displaced fracture of the anterolateral right fifth rib.  Nondisplaced anterior severe right sixth rib fracture.    Continue gabapentin  600 mg p.o. twice daily.   Percocet as needed. 4. Hypertension-blood pressure is stable, continue Aldactone, torsemide. 5. Paroxysmal atrial fibrillation-continue amiodarone, aspirin was discontinued by neurosurgery for possible need for surgical intervention.  Heart rate is controlled.  Aspirin can be restarted in 10 days from surgery as per neurosurgery recommendation.  Can start taking aspirin from 08/03/2020. 6. Alcohol abuse-no signs and symptoms of alcohol withdrawal. 7. Chronic HCV with liver cirrhosis-LFTs are stable on presentation.  Follow-up as outpatient 8. Hypomagnesemia-magnesium is 1.6, will replace with mag sulfate 2 g IV x1. 9. Diabetes mellitus type 2-continue glipizide.  Procedures:  ACDF  Consultations:  Neurosurgery  Cardiology  Discharge Exam: Vitals:   07/27/20 0349 07/27/20 0841  BP: 118/78 120/78  Pulse: 80 70  Resp: 18 16  Temp: 97.9 F (36.6 C) 97.8 F (36.6 C)  SpO2: 99% 97%    General: Appears in no  acute distress Cardiovascular: S1-S2, regular Respiratory: Clear to auscultation bilaterally  Discharge Instructions   Discharge Instructions    Diet - low sodium heart  healthy   Complete by: As directed    Incentive spirometry RT   Complete by: As directed    Increase activity slowly   Complete by: As directed    No wound care   Complete by: As directed      Allergies as of 07/27/2020      Reactions   Angiotensin Receptor Blockers Anaphylaxis, Other (See Comments)   (Angioedema also with Lisinopril, therefore ARB's are contraindicated)   Lisinopril Anaphylaxis, Swelling   Throat swelling   Pamelor [nortriptyline Hcl] Anaphylaxis, Swelling   Throat swells      Medication List    STOP taking these medications   aspirin 81 MG EC tablet   lidocaine 5 % Commonly known as: LIDODERM   methocarbamol 500 MG tablet Commonly known as: ROBAXIN   sodium chloride 1 g tablet   traMADol 50 MG tablet Commonly known as: ULTRAM     TAKE these medications   amiodarone 200 MG tablet Commonly known as: PACERONE Take 1 tablet (200 mg total) by mouth daily.   atorvastatin 20 MG tablet Commonly known as: LIPITOR Take 1 tablet (20 mg total) by mouth daily at 6 PM.   blood glucose meter kit and supplies Kit Dispense based on patient and insurance preference. Use up to four times daily as directed. (FOR ICD-9 250.00, 250.01).   digoxin 0.125 MG tablet Commonly known as: LANOXIN Take 1 tablet (0.125 mg total) by mouth daily. Start taking on: July 28, 2020   gabapentin 300 MG capsule Commonly known as: NEURONTIN Take 2 capsules (600 mg total) by mouth 2 (two) times daily. What changed: when to take this   glipiZIDE 5 MG tablet Commonly known as: GLUCOTROL Take 0.5 tablets (2.5 mg total) by mouth daily before breakfast.   isosorbide-hydrALAZINE 20-37.5 MG tablet Commonly known as: BIDIL Take 1 tablet by mouth 3 (three) times daily.   oxyCODONE-acetaminophen 7.5-325 MG tablet Commonly known as: PERCOCET Take 1 tablet by mouth every 4 (four) hours as needed for moderate pain.   polyethylene glycol 17 g packet Commonly known as: MIRALAX /  GLYCOLAX Take 17 g by mouth daily as needed for moderate constipation.   spironolactone 25 MG tablet Commonly known as: ALDACTONE Take 0.5 tablets (12.5 mg total) by mouth daily. Start taking on: July 28, 2020 What changed: how much to take   thiamine 100 MG tablet Take 1 tablet (100 mg total) by mouth daily. Start taking on: July 28, 2020   torsemide 20 MG tablet Commonly known as: DEMADEX Take 2 tablets (40 mg total) by mouth daily. Start taking on: July 28, 2020 What changed: how much to take            Durable Medical Equipment  (From admission, onward)         Start     Ordered   07/27/20 1022  For home use only DME Walker rolling  Once       Question Answer Comment  Walker: With 5 Inch Wheels   Patient needs a walker to treat with the following condition S/P cervical spinal fusion      07/27/20 1021   07/27/20 1022  For home use only DME Cane  Once        07/27/20 1021         Allergies  Allergen Reactions  .  Angiotensin Receptor Blockers Anaphylaxis and Other (See Comments)    (Angioedema also with Lisinopril, therefore ARB's are contraindicated)  . Lisinopril Anaphylaxis and Swelling    Throat swelling  . Pamelor [Nortriptyline Hcl] Anaphylaxis and Swelling    Throat swells    Follow-up Information    Dawley, Troy C, DO Follow up in 10 day(s).   Why: please call for an appointment Need to come for staple removal Contact information: [address] [place] [address] [place] [address] [phone]                The results of significant diagnostics from this hospitalization (including imaging, microbiology, ancillary and laboratory) are listed below for reference.    Significant Diagnostic Studies: EEG  Result Date: 07/18/2020 Lora Havens, MD     07/18/2020  6:08 PM Patient Name: Khup Sapia MRN: [id number] Epilepsy Attending: Lora Havens Referring Physician/Provider: Dr. Davonna Belling Date: 07/18/2020 Duration: 24.59  mins Patient history: 59 year old male who presented to the ED with complaints of right-sided rib pain.  In the ED was noted to have sodium of 116.  While in the ED was also noted to have rhythmic right-sided jerking occurring approximately every 15 seconds.  EEG to evaluate for seizures. Level of alertness: Awake, asleep AEDs during EEG study: None Technical aspects: This EEG study was done with scalp electrodes positioned according to the 10-20 International system of electrode placement. Electrical activity was acquired at a sampling rate of '500Hz'  and reviewed with a high frequency filter of '70Hz'  and a low frequency filter of '1Hz' . EEG data were recorded continuously and digitally stored. Description: The posterior dominant rhythm consists of 8.5 Hz activity of moderate voltage (25-35 uV) seen predominantly in posterior head regions, symmetric and reactive to eye opening and eye closing. Sleep was characterized by vertex waves, sleep spindles (12 to 14 Hz), maximal frontocentral region. Hyperventilation and photic stimulation were not performed.   Multiple episodes of intermittent brief right leg contraction/twitching were recorded during the EEG.  Concomitant EEG before, during and after the episode did not show any EEG change to suggest seizure. IMPRESSION: This study is within normal limits. No seizures or epileptiform discharges were seen throughout the recording. Multiple episodes of brief right lower extremity twitching were recorded without concomitant EEG change and were most likely nonepileptic. Priyanka [name]   DG Ribs Unilateral W/Chest Right  Result Date: 06/30/2020 CLINICAL DATA:  Rib pain, fall 1-2 weeks ago. EXAM: RIGHT RIBS AND CHEST - 3+ VIEW COMPARISON:  Chest x-rays dated 06/21/2020 and 05/29/2020. Chest CT dated 06/21/2020. FINDINGS: Stable cardiomegaly. Lungs are clear. No pleural effusion or pneumothorax is seen. Nondisplaced healing fracture of the RIGHT anterior sixth rib.  Questionable acute slightly displaced fracture of the RIGHT anterior-lateral fifth rib. Osseous structures about the chest are otherwise unremarkable. IMPRESSION: 1. Healing/subacute nondisplaced fracture of the RIGHT anterior sixth rib. Questionable acute slightly displaced fracture of the RIGHT anterior-lateral fifth rib. 2. Stable cardiomegaly. No evidence of pneumonia or pulmonary edema. No pleural effusion or pneumothorax is seen. Electronically Signed   By: Franki Cabot M.D.   On: 06/30/2020 09:09   DG Ribs Bilateral W/Chest  Result Date: 07/18/2020 CLINICAL DATA:  Pain following recent fall EXAM: BILATERAL RIBS AND CHEST - 4+ VIEW COMPARISON:  June 30, 2020 FINDINGS: Frontal chest as well as oblique and cone-down rib images bilaterally obtained. Lungs are clear. Heart is upper normal in size with pulmonary vascularity normal. No adenopathy. There is a displaced  fracture of the anterolateral right fifth rib. There is a nondisplaced fracture of the anterior right sixth rib. No other fractures are evident. There is no appreciable pneumothorax or pleural effusion on the right. IMPRESSION: Mildly displaced fracture of the anterolateral right fifth rib, perhaps marginally more displaced than on prior study. Nondisplaced fracture anterior right sixth rib. No other fractures are evident. No pneumothorax or pleural effusion. Lungs clear. Heart upper normal in size. Electronically Signed   By: Lowella Grip III M.D.   On: 07/18/2020 11:24   DG Cervical Spine 2-3 Views  Result Date: 07/24/2020 CLINICAL DATA:  Provided history: Surgery, elective. C4-6 ACDF. Fluoroscopy time 8 seconds, 0.35 mGy. EXAM: CERVICAL SPINE - 2-3 VIEW; DG C-ARM 1-60 MIN COMPARISON:  Cervical spine MRI 07/18/2020 FINDINGS: PA and lateral view fluoroscopic images of the cervical spine are submitted, two images total. Sequela of interval C4-C6 ACDF. No unexpected finding. Partially visualized support tubes. IMPRESSION: Two intraoperative  fluoroscopic images of the cervical spine demonstrating interval C4-C6 ACDF. No unexpected finding. Electronically Signed   By: Kellie Simmering DO   On: 07/24/2020 14:15   MR CERVICAL SPINE WO CONTRAST  Result Date: 07/18/2020 CLINICAL DATA:  59 year old male status post fall 2 weeks ago with acute or progressive myelopathy. EXAM: MRI CERVICAL SPINE WITHOUT CONTRAST TECHNIQUE: Multiplanar, multisequence MR imaging of the cervical spine was performed. No intravenous contrast was administered. COMPARISON:  Cervical spine CT 05/03/2020. FINDINGS: Study is mildly degraded by motion artifact despite repeated imaging attempts, and no diagnostic axial GRE images could be obtained. Alignment: Straightening of cervical lordosis is stable from the May CT. No significant spondylolisthesis. Vertebrae: No marrow edema or evidence of acute osseous abnormality. Visualized bone marrow signal is within normal limits. Cord: Limited cervical spinal cord detail due to motion artifact, but suggestion of cord T2 heterogeneity at both C4-C5 and C5-C6 where significant degenerative spinal stenosis and cord compression are noted (see below) suspicious for myelomalacia. Above and below those levels cord signal and morphology appears within normal limits. Posterior Fossa, vertebral arteries, paraspinal tissues: Cervicomedullary junction appears within normal limits. There is some inferior left cerebellar encephalomalacia suspected (series 6, image 14). Grossly negative visible neck soft tissues, lung apices. Disc levels: C2-C3: Disc bulging and endplate spurring is mild. No spinal stenosis. There is mild C3 foraminal stenosis. C3-C4: Mild disc bulging and endplate spurring. Right neural foramen most affected. Moderate to severe right C4 foraminal stenosis. No spinal stenosis. C4-C5: Mild disc space loss but bulky broad-based posterior disc osteophyte complex. This is mildly lobulated and results in moderate to severe spinal stenosis with at  least moderate cord mass effect (series 7, image 17). Superimposed severe bilateral C5 foraminal stenosis. C5-C6: Disc space loss with similar bulky disc osteophyte complex, broad-based mildly lobulated posterior component. Moderate to severe spinal stenosis and at least moderate cord mass effect (series 7, image 21). Severe bilateral C6 foraminal stenosis. C6-C7: Mild circumferential disc bulge and endplate spurring. Mild to moderate posterior element hypertrophy. Borderline to mild spinal stenosis. No cord mass effect. Mild left and moderate right C7 foraminal stenosis. C7-T1:  Mild facet hypertrophy.  No significant stenosis. Negative visible upper thoracic levels. IMPRESSION: 1. Bulky disc osteophyte degeneration at both C4-C5 and C5-C6 with moderate to severe spinal stenosis at both levels, at least moderate cord mass effect. Suspicion of abnormal cord signal (myelomalacia versus cord edema from recent fall) although suboptimal cord detail due to motion artifacts. 2. No other significant spinal stenosis despite additional degeneration. There  is moderate or severe degenerative neural foraminal stenosis at the right C4, bilateral C5 and C6, and right C7 nerve levels. Salient findings on this exam were reviewed in person with Dr. Roland Rack on 07/18/2020 at 18:10 . Electronically Signed   By: Genevie Ann M.D.   On: 07/18/2020 18:15   DG C-Arm 1-60 Min  Result Date: 07/24/2020 CLINICAL DATA:  Provided history: Surgery, elective. C4-6 ACDF. Fluoroscopy time 8 seconds, 0.35 mGy. EXAM: CERVICAL SPINE - 2-3 VIEW; DG C-ARM 1-60 MIN COMPARISON:  Cervical spine MRI 07/18/2020 FINDINGS: PA and lateral view fluoroscopic images of the cervical spine are submitted, two images total. Sequela of interval C4-C6 ACDF. No unexpected finding. Partially visualized support tubes. IMPRESSION: Two intraoperative fluoroscopic images of the cervical spine demonstrating interval C4-C6 ACDF. No unexpected finding. Electronically  Signed   By: Kellie Simmering DO   On: 07/24/2020 14:15    Microbiology: Recent Results (from the past 240 hour(s))  SARS Coronavirus 2 by RT PCR (hospital order, performed in [facility] hospital lab) Nasopharyngeal Urine, Clean Catch     Status: None   Collection Time: 07/18/20 12:23 PM   Specimen: Urine, Clean Catch; Nasopharyngeal  Result Value Ref Range Status   SARS Coronavirus 2 NEGATIVE NEGATIVE Final    Comment: (NOTE) SARS-CoV-2 target nucleic acids are NOT DETECTED.  The SARS-CoV-2 RNA is generally detectable in upper and lower respiratory specimens during the acute phase of infection. The lowest concentration of SARS-CoV-2 viral copies this assay can detect is 250 copies / mL. A negative result does not preclude SARS-CoV-2 infection and should not be used as the sole basis for treatment or other patient management decisions.  A negative result may occur with improper specimen collection / handling, submission of specimen other than nasopharyngeal swab, presence of viral mutation(s) within the areas targeted by this assay, and inadequate number of viral copies (<250 copies / mL). A negative result must be combined with clinical observations, patient history, and epidemiological information.  Fact Sheet for Patients:   StrictlyIdeas.no  Fact Sheet for Healthcare Providers: BankingDealers.co.za  This test is not yet approved or  cleared by the [place] FDA and has been authorized for detection and/or diagnosis of SARS-CoV-2 by FDA under an Emergency Use Authorization (EUA).  This EUA will remain in effect (meaning this test can be used) for the duration of the COVID-19 declaration under Section 564(b)(1) of the Act, 21 U.S.C. section 360bbb-3(b)(1), unless the authorization is terminated or revoked sooner.  Performed at [facility], [place] [address] [place], [place] [address]   Surgical pcr screen      Status: Abnormal   Collection Time: 07/23/20  5:17 PM   Specimen: Nasal Mucosa; Nasal Swab  Result Value Ref Range Status   MRSA, PCR NEGATIVE NEGATIVE Final   Staphylococcus aureus POSITIVE (A) NEGATIVE Final    Comment: (NOTE) The Xpert SA Assay (FDA approved for NASAL specimens in patients 39 years of age and older), is one component of a comprehensive surveillance program. It is not intended to diagnose infection nor to guide or monitor treatment. Performed at [facility], [place] [address] [place], [place] [address]      Labs: Basic Metabolic Panel: Recent Labs  Lab 07/23/20 0422 07/23/20 0422 07/24/20 0505 07/24/20 0505 07/24/20 0950 07/24/20 1745 07/25/20 0047 07/26/20 0231 07/27/20 0338  NA 127*   < > 125*   < > 126* 130* 127* 132* 131*  K 4.1  --  4.4  --  4.3  --  4.4 4.6  --   CL 93*  --  92*  --  93*  --  89* 96*  --   CO2 27  --  26  --  27  --  24 27  --   GLUCOSE 174*  --  147*  --  130*  --  195* 173*  --   BUN 25*  --  29*  --  28*  --  33* 43*  --   CREATININE 1.37*   < > 1.48*  --  1.44* 1.64* 1.63* 1.70*  --   CALCIUM 8.3*  --  8.3*  --  8.3*  --  8.5* 8.6*  --   MG 2.0  --  1.8  --   --   --  1.6* 1.7 1.6*   < > = values in this interval not displayed.   Liver Function Tests: Recent Labs  Lab 07/21/20 0240  AST 24  ALT 17  ALKPHOS 55  BILITOT 0.5  PROT 5.6*  ALBUMIN 2.4*   No results for input(s): LIPASE, AMYLASE in the last 168 hours. No results for input(s): AMMONIA in the last 168 hours. CBC: Recent Labs  Lab 07/20/20 1201 07/24/20 0505 07/24/20 1745 07/25/20 0047  WBC 3.3* 3.9* 5.1 6.1  NEUTROABS  --  2.6  --  5.5  HGB 10.7* 9.6* 11.9* 10.7*  HCT 31.8* 29.2* 36.6* 32.2*  MCV 86.2 88.2 89.1 89.7  PLT 177 156 203 188   Cardiac Enzymes: No results for input(s): CKTOTAL, CKMB, CKMBINDEX, TROPONINI in the last 168 hours. BNP: BNP (last 3 results) Recent Labs    05/29/20 0430 06/21/20 0626 06/30/20 0657  BNP 678.3*  584.3* 656.4*    ProBNP (last 3 results) No results for input(s): PROBNP in the last 8760 hours.  CBG: Recent Labs  Lab 07/25/20 2130 07/26/20 1252 07/26/20 1617 07/26/20 2126 07/27/20 0615  GLUCAP 186* 171* 135* 173* 179*       Signed:  Oswald Hillock MD.  [facility] 07/27/2020, 11:01 AM

## 2020-07-27 NOTE — Progress Notes (Signed)
Occupational Therapy Treatment Patient Details Name: Brad [name] MRN: [id number] DOB: 1961/12/12 Today's Date: 07/27/2020    History of present illness 59 y.o. male with medical history significant of diet-controlled diabetes mellitus, systolic CHF with ejection fraction of 25 to 30%, A. fib on Eliquis (s/p TEE and cardioversion), CKD stage III, chronic hyponatremia, recurrent falls, hypertension, tobacco abuse, alcohol abuse, cocaine abuse, depression/anxiety, hyperlipidemia presents to emergency department due to incr rt sided rib pain (fell ~1 month prior with anterolateral 5-6th rib fractures) and RLE myoclonic jerking. Also recently admitted with acute on chronic hyponatremia. EEG negative; MRI C4-C6 moderate cord mass effect with suspicion of abnormal cord signal (myelomalacia versus cord edema from recent fall). Required medical work-up prior to ACDF C4-6 on 07/24/2020    OT comments  Pt. Seen for skilled OT treatment.  Motivated and eager to get oob.  Able to demonstrate lb dressing, toileting, and simulated tub transfer with min guard a.  Able to recall and incorporate cervical precautions during tasks.   Follow Up Recommendations  SNF;Supervision/Assistance - 24 hour    Equipment Recommendations       Recommendations for Other Services      Precautions / Restrictions Precautions Precautions: Cervical;Fall Precaution Comments: reviewed cervical precautions, pt states "I have to turn my whole body when I want to look at something" as recall; required cues to maintain Required Braces or Orthoses: Cervical Brace Cervical Brace: Hard collar;Other (comment)       Mobility Bed Mobility Overal bed mobility: Needs Assistance Bed Mobility: Sit to Supine       Sit to supine: Supervision      Transfers Overall transfer level: Needs assistance Equipment used: Straight cane Transfers: Sit to/from Stand Sit to Stand: Supervision         General transfer comment: cues for  maintaining precautions and use of cane    Balance Overall balance assessment: History of Falls;Needs assistance Sitting-balance support: No upper extremity supported Sitting balance-Leahy Scale: Good     Standing balance support: No upper extremity supported Standing balance-Leahy Scale: Fair Standing balance comment: for dynamic balance requires UE support                           ADL either performed or assessed with clinical judgement   ADL Overall ADL's : Needs assistance/impaired     Grooming: Min guard;Standing Grooming Details (indicate cue type and reason): simulated during in room mobilty             Lower Body Dressing: Sitting/lateral leans;Sit to/from stand;Cueing for compensatory techniques;Cueing for back precautions;Min guard Lower Body Dressing Details (indicate cue type and reason): pt able to pull up socks and complete figure 4 technique Toilet Transfer: Ambulation;Min guard;Cueing for safety;Cueing for sequencing       Tub/ Shower Transfer: Tub transfer;Min guard;Ambulation Tub/Shower Transfer Details (indicate cue type and reason): simulated with pt. "stepping" over est.  height of tub Functional mobility during ADLs: Min guard       Vision       Perception     Praxis      Cognition Arousal/Alertness: Awake/alert Behavior During Therapy: Impulsive Overall Cognitive Status: No family/caregiver present to determine baseline cognitive functioning Area of Impairment: Safety/judgement;Awareness;Problem solving                         Safety/Judgement: Decreased awareness of safety;Decreased awareness of deficits Awareness: Emergent Problem Solving: Requires verbal cues;Difficulty  sequencing General Comments: not wearing collar on arrival "I was about to shave" Can verbalize precautions, however needs max cues to maintain; could not problem-solve how to put his collar back on by himself        Exercises     Shoulder  Instructions       General Comments pt reports anxious to discharge today and moving too quickly    Pertinent Vitals/ Pain       Pain Assessment: No/denies pain Faces Pain Scale: Hurts little more Pain Location: neck Pain Descriptors / Indicators: Sore Pain Intervention(s): Limited activity within patient's tolerance;Monitored during session  Home Living                                          Prior Functioning/Environment              Frequency  Min 2X/week        Progress Toward Goals  OT Goals(current goals can now be found in the care plan section)  Progress towards OT goals: Progressing toward goals  Acute Rehab OT Goals Patient Stated Goal: to be sure he heals fully  Plan      Co-evaluation                 AM-PAC OT "6 Clicks" Daily Activity     Outcome Measure   Help from another person eating meals?: None Help from another person taking care of personal grooming?: A Little Help from another person toileting, which includes using toliet, bedpan, or urinal?: A Little Help from another person bathing (including washing, rinsing, drying)?: A Little Help from another person to put on and taking off regular upper body clothing?: A Little Help from another person to put on and taking off regular lower body clothing?: A Little 6 Click Score: 19    End of Session Equipment Utilized During Treatment: Gait belt;Other (comment) (single cane)  OT Visit Diagnosis: Unsteadiness on feet (R26.81);Muscle weakness (generalized) (M62.81);Other symptoms and signs involving the nervous system (R29.898);Pain   Activity Tolerance Patient tolerated treatment well   Patient Left in bed;with call bell/phone within reach;with bed alarm set   Nurse Communication          Time: 7078-6754 OT Time Calculation (min): 10 min  Charges: OT General Charges $OT Visit: 1 Visit OT Treatments $Self Care/Home Management : 8-22 mins  Sonia Baller,  COTA/L Acute Rehabilitation [phone]   Janice Coffin 07/27/2020, 12:01 PM

## 2020-07-27 NOTE — Plan of Care (Signed)
  Problem: Education: Goal: Knowledge of General Education information will improve Description: Including pain rating scale, medication(s)/side effects and non-pharmacologic comfort measures Outcome: Adequate for Discharge   Problem: Health Behavior/Discharge Planning: Goal: Ability to manage health-related needs will improve Outcome: Adequate for Discharge   Problem: Clinical Measurements: Goal: Ability to maintain clinical measurements within normal limits will improve Outcome: Adequate for Discharge Goal: Will remain Laban Orourke from infection Outcome: Adequate for Discharge Goal: Diagnostic test results will improve Outcome: Adequate for Discharge Goal: Respiratory complications will improve Outcome: Adequate for Discharge Goal: Cardiovascular complication will be avoided Outcome: Adequate for Discharge   Problem: Activity: Goal: Risk for activity intolerance will decrease Outcome: Adequate for Discharge   Problem: Nutrition: Goal: Adequate nutrition will be maintained Outcome: Adequate for Discharge   Problem: Coping: Goal: Level of anxiety will decrease Outcome: Adequate for Discharge   Problem: Elimination: Goal: Will not experience complications related to bowel motility Outcome: Adequate for Discharge Goal: Will not experience complications related to urinary retention Outcome: Adequate for Discharge   Problem: Pain Managment: Goal: General experience of comfort will improve Outcome: Adequate for Discharge   Problem: Safety: Goal: Ability to remain Mayu Ronk from injury will improve Outcome: Adequate for Discharge   Problem: Skin Integrity: Goal: Risk for impaired skin integrity will decrease Outcome: Adequate for Discharge   

## 2020-07-27 NOTE — Progress Notes (Signed)
Physical Therapy Treatment Patient Details Name: Brad [name] MRN: [id number] DOB: 02-01-1961 Today's Date: 07/27/2020    History of Present Illness 59 y.o. male with medical history significant of diet-controlled diabetes mellitus, systolic CHF with ejection fraction of 25 to 30%, A. fib on Eliquis (s/p TEE and cardioversion), CKD stage III, chronic hyponatremia, recurrent falls, hypertension, tobacco abuse, alcohol abuse, cocaine abuse, depression/anxiety, hyperlipidemia presents to emergency department due to incr rt sided rib pain (fell ~1 month prior with anterolateral 5-6th rib fractures) and RLE myoclonic jerking. Also recently admitted with acute on chronic hyponatremia. EEG negative; MRI C4-C6 moderate cord mass effect with suspicion of abnormal cord signal (myelomalacia versus cord edema from recent fall). Required medical work-up prior to ACDF C4-6 on 07/24/2020     PT Comments    Patient eager to discharge today. Required max cues to maintain cervical precautions (especially when he had his collar off, but also with collar). He surprisingly does ok with use of cane vs RW (quickly adjusts to proper sequencing, however does "catch" cane twice due to poor clearance during swing). He was instructed on stairs with cervical collar for safety.     Follow Up Recommendations  Home health PT;Supervision/Assistance - 24 hour (*per CM does not qualify for HHPT)     Equipment Recommendations  Cane    Recommendations for Other Services       Precautions / Restrictions Precautions Precautions: Cervical;Fall Precaution Comments: reviewed cervical precautions, pt states "I have to turn my whole body when I want to look at something" as recall; required cues to maintain Required Braces or Orthoses: Cervical Brace Cervical Brace: Hard collar;Other (comment) (can be off for eating and shower per MD note)    Mobility  Bed Mobility                  Transfers Overall transfer level:  Needs assistance Equipment used: Straight cane Transfers: Sit to/from Stand Sit to Stand: Supervision         General transfer comment: cues for maintaining precautions and use of cane  Ambulation/Gait Ambulation/Gait assistance: Supervision Gait Distance (Feet): 260 Feet Assistive device: Straight cane Gait Pattern/deviations: Step-through pattern;Decreased stride length;Decreased dorsiflexion - right Gait velocity: decr   General Gait Details: supervision for safety, verbal cuing for upright posture, sequencing cane with gait; pt catching cane x2 due to not lifting enough to clear the floor as advancing cane   Stairs Stairs: Yes Stairs assistance: Min guard Stair Management: One rail Left;Step to pattern;Alternating pattern;Forwards;With cane Number of Stairs: 5 General stair comments: more secure with step-to pattern; imbalance with self-recovery with descending step-through   Wheelchair Mobility    Modified [name] (Stroke Patients Only)       Balance Overall balance assessment: History of Falls;Needs assistance Sitting-balance support: No upper extremity supported Sitting balance-Leahy Scale: Good     Standing balance support: No upper extremity supported Standing balance-Leahy Scale: Fair Standing balance comment: for dynamic balance requires UE support                            Cognition Arousal/Alertness: Awake/alert Behavior During Therapy: Impulsive Overall Cognitive Status: No family/caregiver present to determine baseline cognitive functioning Area of Impairment: Safety/judgement;Awareness;Problem solving                         Safety/Judgement: Decreased awareness of safety;Decreased awareness of deficits Awareness: Emergent Problem Solving: Requires verbal cues;Difficulty sequencing General  Comments: not wearing collar on arrival "I was about to shave" Can verbalize precautions, however needs max cues to maintain; could not  problem-solve how to put his collar back on by himself      Exercises      General Comments General comments (skin integrity, edema, etc.): pt reports anxious to discharge today and moving too quickly      Pertinent Vitals/Pain Pain Assessment: 0-10 Faces Pain Scale: Hurts little more Pain Location: neck Pain Descriptors / Indicators: Sore Pain Intervention(s): Limited activity within patient's tolerance;Monitored during session    Home Living                      Prior Function            PT Goals (current goals can now be found in the care plan section) Acute Rehab PT Goals Patient Stated Goal: to be sure he heals fully PT Goal Formulation: With patient Time For Goal Achievement: 08/08/20 Potential to Achieve Goals: Good Progress towards PT goals: Progressing toward goals    Frequency    Min 5X/week      PT Plan Current plan remains appropriate    Co-evaluation              AM-PAC PT "6 Clicks" Mobility   Outcome Measure  Help needed turning from your back to your side while in a flat bed without using bedrails?: A Little Help needed moving from lying on your back to sitting on the side of a flat bed without using bedrails?: A Little Help needed moving to and from a bed to a chair (including a wheelchair)?: A Little Help needed standing up from a chair using your arms (e.g., wheelchair or bedside chair)?: A Little Help needed to walk in hospital room?: A Little Help needed climbing 3-5 steps with a railing? : A Little 6 Click Score: 18    End of Session Equipment Utilized During Treatment: Gait belt;Cervical collar Activity Tolerance: No increased pain Patient left: with call bell/phone within reach;with bed alarm set;in bed   PT Visit Diagnosis: Other abnormalities of gait and mobility (R26.89);History of falling (Z91.81)     Time: 6269-4854 PT Time Calculation (min) (ACUTE ONLY): 13 min  Charges:  $Gait Training: 8-22 mins                       Arby Barrette, PT Pager [phone]    Rexanne Mano 07/27/2020, 11:25 AM

## 2020-07-27 NOTE — Progress Notes (Signed)
   Providing Compassionate, Quality Care - Together  NEUROSURGERY PROGRESS NOTE   S: No issues overnight. Going home today  O: EXAM:  BP 120/78 (BP Location: Right Arm)   Pulse 70   Temp 97.8 F (36.6 C) (Oral)   Resp 16   Ht 5\' 11"  (1.803 m)   Wt 82.5 kg   SpO2 97%   BMI 25.37 kg/m   Awake, alert, oriented  CNsgrossly intact  Full strength B UE/LE Incision c/d/i Neck soft Trachea midline NLB Collar in place  ASSESSMENT:  59 y.o. male with  1. 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 C4-C6 severe cord compression with cord signal change -s/p ACDF C4-6 on 07/24/2020   PLAN: -collar, ok to have off for eating and showering - pt/ot - pain control - neuro checks q4h - dvt ppx w sqhok - dc planning today  - ok from my standpoint for d/c home/rehab    Thank you for allowing me to participate in this patient's care.  Please do not hesitate to call with questions or concerns.   Elwin Sleight, [facility] Cell: [phone]

## 2020-07-27 NOTE — TOC Transition Note (Addendum)
Transition of Care [facility]) - CM/SW Discharge Note   Patient Details  Name: Brad [name] MRN: [id number] Date of Birth: 1961/02/11  Transition of Care [facility]) CM/SW Contact:  Pollie Friar, RN Phone Number: 07/27/2020, 11:14 AM   Clinical Narrative:    Pt discharging home with self care. Unable to find [facility] agency to accept for [facility] services. Pt is aware and understanding. MD also aware. Pt to have cane delivered to the room per [employer].  Pt has transportation home.  [facility] pharmacy to deliver d/c meds to the room.  1415: pt unable to get a hold of his ride home. CM has provided a cab voucher to his bedside RN.  Final next level of care: Home/Self Care Barriers to Discharge: No Barriers Identified   Patient Goals and CMS Choice Patient states their goals for this hospitalization and ongoing recovery are:: Get through surgery CMS Medicare.gov Compare Post Acute Care list provided to:: Patient Choice offered to / list presented to : Patient  Discharge Placement                       Discharge Plan and Services In-house Referral: Clinical Social Work   Post Acute Care Choice: [place]          DME Arranged: Kasandra Knudsen DME Agency: [employer] Date DME Agency Contacted: 07/27/20   Representative spoke with at DME Agency: Zack            Social Determinants of Health ([place]) Interventions     Readmission Risk Interventions Readmission Risk Prevention Plan 07/23/2020 06/13/2020 06/10/2020  Transportation Screening Complete Complete Complete  PCP or Specialist Appt within 5-7 Days - - -  Home Care Screening - - -  Medication Review (RN CM) - - -  Medication Review (RN Transport planner) Referral to Pharmacy Complete Complete  PCP or Specialist appointment within 3-5 days of discharge Complete Complete (No Data)  PCP/Specialist Appt Not Complete comments - - -  HRI or Home Care Consult Complete Complete -  [place] or Home Care Consult Pt Refusal Comments - - -  SW  Recovery Care/Counseling Consult Complete Complete Complete  Palliative Care Screening Not Applicable Not Applicable Not Applicable  Skilled Nursing Facility Complete Complete Not Applicable  Some recent data might be hidden

## 2020-07-30 ENCOUNTER — Other Ambulatory Visit: Payer: Self-pay

## 2020-07-30 ENCOUNTER — Telehealth (HOSPITAL_COMMUNITY): Payer: Self-pay | Admitting: Licensed Clinical Social Worker

## 2020-07-30 ENCOUNTER — Emergency Department (HOSPITAL_COMMUNITY): Payer: Medicaid Other

## 2020-07-30 ENCOUNTER — Emergency Department (HOSPITAL_BASED_OUTPATIENT_CLINIC_OR_DEPARTMENT_OTHER): Payer: Medicaid Other

## 2020-07-30 ENCOUNTER — Encounter (HOSPITAL_COMMUNITY): Payer: Self-pay | Admitting: Emergency Medicine

## 2020-07-30 ENCOUNTER — Emergency Department (HOSPITAL_COMMUNITY)
Admission: EM | Admit: 2020-07-30 | Discharge: 2020-07-30 | Disposition: A | Discharge status: Home / Self Care | Payer: Medicaid Other | Attending: Emergency Medicine | Admitting: Emergency Medicine

## 2020-07-30 DIAGNOSIS — N183 Chronic kidney disease, stage 3 unspecified: Secondary | ICD-10-CM | POA: Insufficient documentation

## 2020-07-30 DIAGNOSIS — E1140 Type 2 diabetes mellitus with diabetic neuropathy, unspecified: Secondary | ICD-10-CM | POA: Diagnosis not present

## 2020-07-30 DIAGNOSIS — F17210 Nicotine dependence, cigarettes, uncomplicated: Secondary | ICD-10-CM | POA: Diagnosis not present

## 2020-07-30 DIAGNOSIS — I2510 Atherosclerotic heart disease of native coronary artery without angina pectoris: Secondary | ICD-10-CM | POA: Insufficient documentation

## 2020-07-30 DIAGNOSIS — R600 Localized edema: Secondary | ICD-10-CM | POA: Insufficient documentation

## 2020-07-30 DIAGNOSIS — Z79899 Other long term (current) drug therapy: Secondary | ICD-10-CM | POA: Diagnosis not present

## 2020-07-30 DIAGNOSIS — R609 Edema, unspecified: Secondary | ICD-10-CM | POA: Diagnosis not present

## 2020-07-30 DIAGNOSIS — I5042 Chronic combined systolic (congestive) and diastolic (congestive) heart failure: Secondary | ICD-10-CM | POA: Insufficient documentation

## 2020-07-30 DIAGNOSIS — Z981 Arthrodesis status: Secondary | ICD-10-CM | POA: Insufficient documentation

## 2020-07-30 DIAGNOSIS — Z7982 Long term (current) use of aspirin: Secondary | ICD-10-CM | POA: Diagnosis not present

## 2020-07-30 DIAGNOSIS — I130 Hypertensive heart and chronic kidney disease with heart failure and stage 1 through stage 4 chronic kidney disease, or unspecified chronic kidney disease: Secondary | ICD-10-CM | POA: Insufficient documentation

## 2020-07-30 DIAGNOSIS — Z7984 Long term (current) use of oral hypoglycemic drugs: Secondary | ICD-10-CM | POA: Insufficient documentation

## 2020-07-30 DIAGNOSIS — I509 Heart failure, unspecified: Secondary | ICD-10-CM

## 2020-07-30 DIAGNOSIS — E1122 Type 2 diabetes mellitus with diabetic chronic kidney disease: Secondary | ICD-10-CM | POA: Insufficient documentation

## 2020-07-30 DIAGNOSIS — M542 Cervicalgia: Secondary | ICD-10-CM | POA: Insufficient documentation

## 2020-07-30 LAB — CBC WITH DIFFERENTIAL/PLATELET
Abs Immature Granulocytes: 0.02 10*3/uL (ref 0.00–0.07)
Basophils Absolute: 0.1 10*3/uL (ref 0.0–0.1)
Basophils Relative: 1 %
Eosinophils Absolute: 0 10*3/uL (ref 0.0–0.5)
Eosinophils Relative: 1 %
HCT: 32 % — ABNORMAL LOW (ref 39.0–52.0)
Hemoglobin: 10.2 g/dL — ABNORMAL LOW (ref 13.0–17.0)
Immature Granulocytes: 0 %
Lymphocytes Relative: 5 %
Lymphs Abs: 0.4 10*3/uL — ABNORMAL LOW (ref 0.7–4.0)
MCH: 29 pg (ref 26.0–34.0)
MCHC: 31.9 g/dL (ref 30.0–36.0)
MCV: 90.9 fL (ref 80.0–100.0)
Monocytes Absolute: 1 10*3/uL (ref 0.1–1.0)
Monocytes Relative: 13 %
Neutro Abs: 6.3 10*3/uL (ref 1.7–7.7)
Neutrophils Relative %: 80 %
Platelets: 244 10*3/uL (ref 150–400)
RBC: 3.52 MIL/uL — ABNORMAL LOW (ref 4.22–5.81)
RDW: 15 % (ref 11.5–15.5)
WBC: 7.8 10*3/uL (ref 4.0–10.5)
nRBC: 0 % (ref 0.0–0.2)

## 2020-07-30 LAB — COMPREHENSIVE METABOLIC PANEL
ALT: 23 U/L (ref 0–44)
AST: 43 U/L — ABNORMAL HIGH (ref 15–41)
Albumin: 2.5 g/dL — ABNORMAL LOW (ref 3.5–5.0)
Alkaline Phosphatase: 68 U/L (ref 38–126)
Anion gap: 13 (ref 5–15)
BUN: 27 mg/dL — ABNORMAL HIGH (ref 6–20)
CO2: 20 mmol/L — ABNORMAL LOW (ref 22–32)
Calcium: 8.4 mg/dL — ABNORMAL LOW (ref 8.9–10.3)
Chloride: 93 mmol/L — ABNORMAL LOW (ref 98–111)
Creatinine, Ser: 1.33 mg/dL — ABNORMAL HIGH (ref 0.61–1.24)
GFR calc Af Amer: >60 mL/min (ref >60)
GFR calc non Af Amer: 58 mL/min — ABNORMAL LOW (ref >60)
Glucose, Bld: 169 mg/dL — ABNORMAL HIGH (ref 70–99)
Potassium: 4.3 mmol/L (ref 3.5–5.1)
Sodium: 126 mmol/L — ABNORMAL LOW (ref 135–145)
Total Bilirubin: 0.3 mg/dL (ref 0.3–1.2)
Total Protein: 6.3 g/dL — ABNORMAL LOW (ref 6.5–8.1)

## 2020-07-30 LAB — TROPONIN I (HIGH SENSITIVITY): Troponin I (High Sensitivity): 26 ng/L — ABNORMAL HIGH (ref <18)

## 2020-07-30 LAB — BRAIN NATRIURETIC PEPTIDE: B Natriuretic Peptide: 957.4 pg/mL — ABNORMAL HIGH (ref 0.0–100.0)

## 2020-07-30 LAB — ETHANOL: Alcohol, Ethyl (B): 31 mg/dL — ABNORMAL HIGH (ref <10)

## 2020-07-30 IMAGING — CR CHEST - 2 VIEW
2 series · 2 of 2 positions shown · non-contrast
Comparison: 07/17/2019

CLINICAL DATA: Chest pain. Shortness of breath.

EXAM:
CHEST - 2 VIEW

[chest lat]
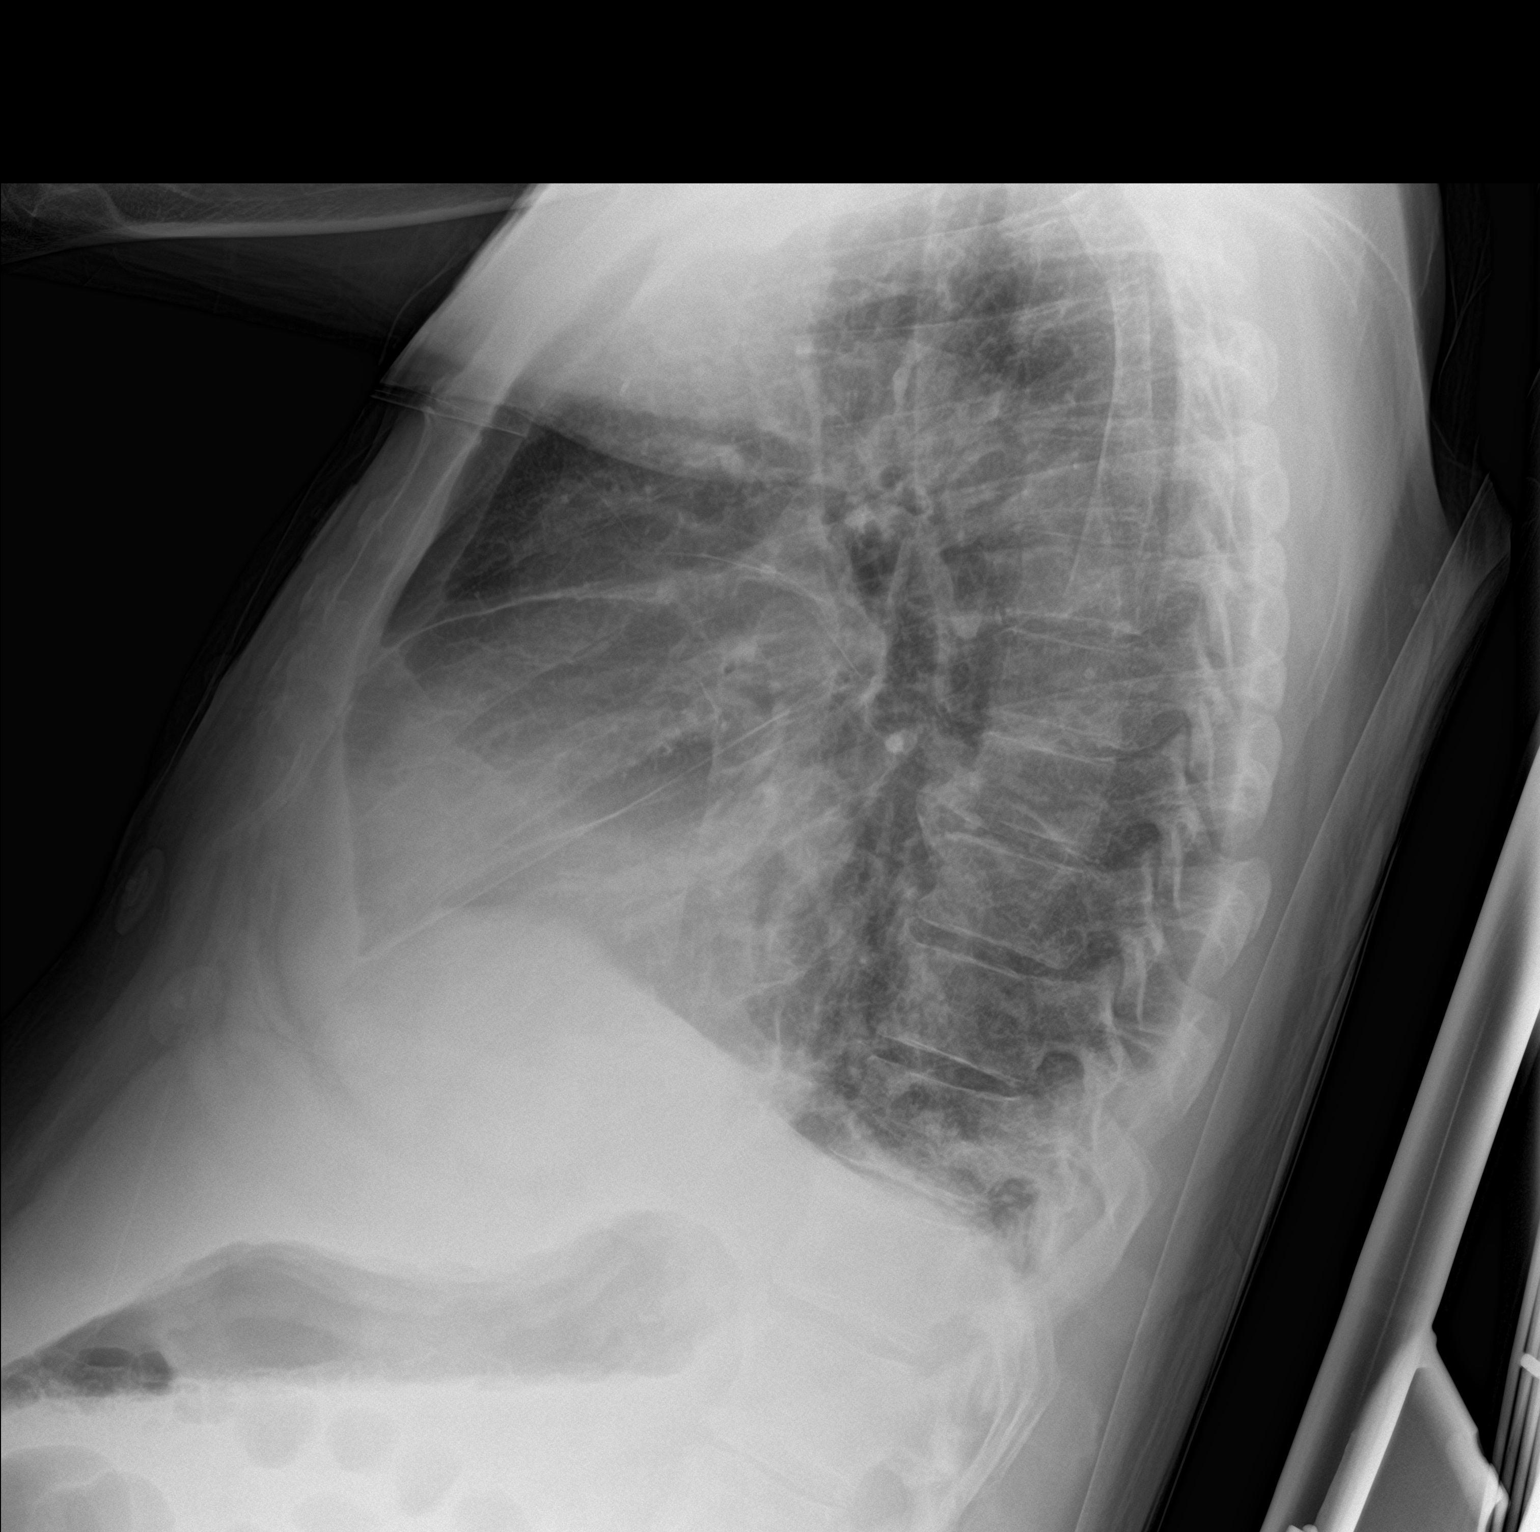

[chest ap]
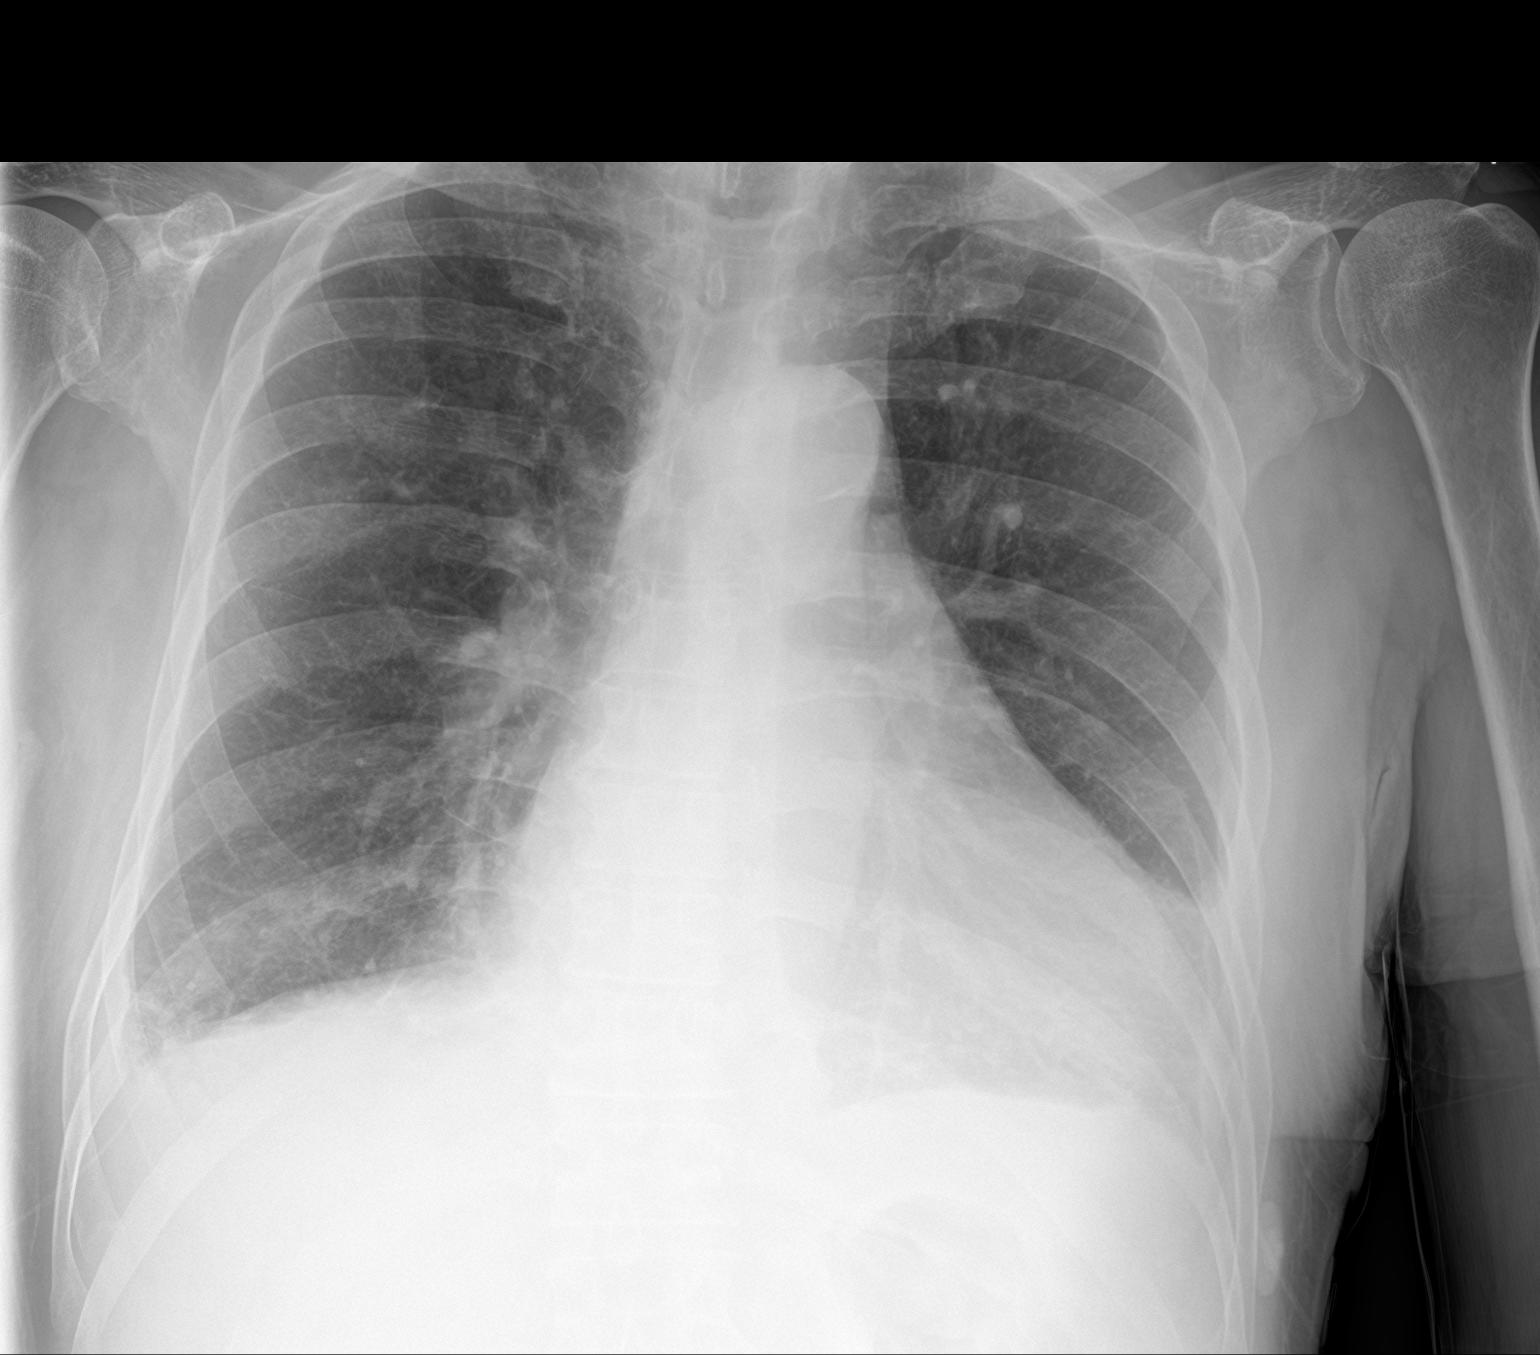

[2 of 2 positions shown; findings below may reference images not displayed]

FINDINGS: The cardiac silhouette remains mildly enlarged. Mild opacity is
present in both lung bases, new/increased from prior. There may be
trace pleural effusions bilaterally. No pneumothorax is identified.
No acute osseous abnormality is seen.
IMPRESSION: Mild bibasilar opacities suggesting atelectasis. Possible trace
pleural effusions.

## 2020-07-30 MED ORDER — FUROSEMIDE 10 MG/ML IJ SOLN
40.0000 mg | Freq: Once | INTRAMUSCULAR | Status: AC
  Administered 2020-07-30: 40 mg via INTRAVENOUS
  Filled 2020-07-30: qty 4

## 2020-07-30 MED ORDER — MORPHINE SULFATE (PF) 4 MG/ML IV SOLN
4.0000 mg | Freq: Once | INTRAVENOUS | Status: AC
  Administered 2020-07-30: 4 mg via INTRAVENOUS
  Filled 2020-07-30: qty 1

## 2020-07-30 MED ORDER — OXYCODONE-ACETAMINOPHEN 5-325 MG PO TABS
1.0000 | ORAL_TABLET | Freq: Once | ORAL | Status: AC
  Administered 2020-07-30: 1 via ORAL
  Filled 2020-07-30: qty 1

## 2020-07-30 NOTE — Progress Notes (Signed)
Bilateral lower extremity venous duplex completed. Refer to "CV Proc" under chart review to view preliminary results.  07/30/2020 9:40 AM Kelby Aline., MHA, RVT, RDCS, RDMS

## 2020-07-30 NOTE — ED Triage Notes (Signed)
Patient arrived with EMS from home reports neck pain onset yesterday , cervical fusion surgery last Aug.3,2021 by Dr. Reatha Armour , patient added he noticed bilateral lower legs edema/swelling this week , + ETOH .
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
## 2020-07-30 NOTE — Discharge Instructions (Addendum)
Continue taking your medications as previously prescribed.  Follow-up with neurosurgery next week as scheduled, and return to the ER if symptoms significantly worsen or change.

## 2020-07-30 NOTE — Telephone Encounter (Signed)
Pt called clinic to inform CSW that he was in the ED.  CSW visited at bedside.  Reports feeling better but still worried about increased fluid/swelling on his legs- ER staff aware of this and provided with dose of IV lasix and instructions on how to help swelling continue to go down.    CSW sent message to community paramedic requesting they see pt tomorrow morning to assess swelling and discuss possible medication adjustments with providers if necessary.  Will continue to follow and assist as needed  [name], [facility] Desk#: [phone] Cell#: [phone]
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
## 2020-07-30 NOTE — ED Notes (Signed)
Patient transported to CT 

## 2020-07-31 ENCOUNTER — Emergency Department (HOSPITAL_COMMUNITY): Payer: Medicaid Other

## 2020-07-31 ENCOUNTER — Other Ambulatory Visit: Payer: Self-pay

## 2020-07-31 ENCOUNTER — Encounter (HOSPITAL_COMMUNITY): Payer: Self-pay | Admitting: Emergency Medicine

## 2020-07-31 ENCOUNTER — Inpatient Hospital Stay (HOSPITAL_COMMUNITY)
Admission: EM | Admit: 2020-07-31 | Discharge: 2020-08-04 | DRG: 291 | Disposition: A | Discharge status: Home / Self Care | Payer: Medicaid Other | Attending: Internal Medicine | Admitting: Internal Medicine

## 2020-07-31 DIAGNOSIS — I5043 Acute on chronic combined systolic (congestive) and diastolic (congestive) heart failure: Secondary | ICD-10-CM | POA: Diagnosis present

## 2020-07-31 DIAGNOSIS — I4892 Unspecified atrial flutter: Secondary | ICD-10-CM | POA: Diagnosis present

## 2020-07-31 DIAGNOSIS — M4802 Spinal stenosis, cervical region: Secondary | ICD-10-CM | POA: Diagnosis not present

## 2020-07-31 DIAGNOSIS — E871 Hypo-osmolality and hyponatremia: Secondary | ICD-10-CM | POA: Diagnosis present

## 2020-07-31 DIAGNOSIS — E1140 Type 2 diabetes mellitus with diabetic neuropathy, unspecified: Secondary | ICD-10-CM | POA: Diagnosis present

## 2020-07-31 DIAGNOSIS — F1020 Alcohol dependence, uncomplicated: Secondary | ICD-10-CM | POA: Diagnosis present

## 2020-07-31 DIAGNOSIS — Z79891 Long term (current) use of opiate analgesic: Secondary | ICD-10-CM

## 2020-07-31 DIAGNOSIS — Z809 Family history of malignant neoplasm, unspecified: Secondary | ICD-10-CM

## 2020-07-31 DIAGNOSIS — Z20822 Contact with and (suspected) exposure to covid-19: Secondary | ICD-10-CM | POA: Diagnosis present

## 2020-07-31 DIAGNOSIS — E7800 Pure hypercholesterolemia, unspecified: Secondary | ICD-10-CM | POA: Diagnosis present

## 2020-07-31 DIAGNOSIS — I509 Heart failure, unspecified: Secondary | ICD-10-CM

## 2020-07-31 DIAGNOSIS — G8929 Other chronic pain: Secondary | ICD-10-CM | POA: Diagnosis present

## 2020-07-31 DIAGNOSIS — F329 Major depressive disorder, single episode, unspecified: Secondary | ICD-10-CM | POA: Diagnosis present

## 2020-07-31 DIAGNOSIS — E8770 Fluid overload, unspecified: Secondary | ICD-10-CM

## 2020-07-31 DIAGNOSIS — Z7982 Long term (current) use of aspirin: Secondary | ICD-10-CM

## 2020-07-31 DIAGNOSIS — B182 Chronic viral hepatitis C: Secondary | ICD-10-CM | POA: Diagnosis present

## 2020-07-31 DIAGNOSIS — F17210 Nicotine dependence, cigarettes, uncomplicated: Secondary | ICD-10-CM | POA: Diagnosis present

## 2020-07-31 DIAGNOSIS — I428 Other cardiomyopathies: Secondary | ICD-10-CM | POA: Diagnosis present

## 2020-07-31 DIAGNOSIS — E785 Hyperlipidemia, unspecified: Secondary | ICD-10-CM | POA: Diagnosis present

## 2020-07-31 DIAGNOSIS — R001 Bradycardia, unspecified: Secondary | ICD-10-CM | POA: Diagnosis present

## 2020-07-31 DIAGNOSIS — R296 Repeated falls: Secondary | ICD-10-CM | POA: Diagnosis present

## 2020-07-31 DIAGNOSIS — Z981 Arthrodesis status: Secondary | ICD-10-CM

## 2020-07-31 DIAGNOSIS — Z8249 Family history of ischemic heart disease and other diseases of the circulatory system: Secondary | ICD-10-CM

## 2020-07-31 DIAGNOSIS — Z79899 Other long term (current) drug therapy: Secondary | ICD-10-CM

## 2020-07-31 DIAGNOSIS — Z8546 Personal history of malignant neoplasm of prostate: Secondary | ICD-10-CM

## 2020-07-31 DIAGNOSIS — I130 Hypertensive heart and chronic kidney disease with heart failure and stage 1 through stage 4 chronic kidney disease, or unspecified chronic kidney disease: Principal | ICD-10-CM | POA: Diagnosis present

## 2020-07-31 DIAGNOSIS — F1010 Alcohol abuse, uncomplicated: Secondary | ICD-10-CM | POA: Diagnosis present

## 2020-07-31 DIAGNOSIS — N183 Chronic kidney disease, stage 3 unspecified: Secondary | ICD-10-CM | POA: Diagnosis present

## 2020-07-31 DIAGNOSIS — I4821 Permanent atrial fibrillation: Secondary | ICD-10-CM | POA: Diagnosis present

## 2020-07-31 DIAGNOSIS — K219 Gastro-esophageal reflux disease without esophagitis: Secondary | ICD-10-CM | POA: Diagnosis present

## 2020-07-31 DIAGNOSIS — N1831 Chronic kidney disease, stage 3a: Secondary | ICD-10-CM | POA: Diagnosis present

## 2020-07-31 DIAGNOSIS — K7460 Unspecified cirrhosis of liver: Secondary | ICD-10-CM | POA: Diagnosis present

## 2020-07-31 LAB — CBC
HCT: 33.3 % — ABNORMAL LOW (ref 39.0–52.0)
Hemoglobin: 10.9 g/dL — ABNORMAL LOW (ref 13.0–17.0)
MCH: 29.5 pg (ref 26.0–34.0)
MCHC: 32.7 g/dL (ref 30.0–36.0)
MCV: 90 fL (ref 80.0–100.0)
Platelets: 331 10*3/uL (ref 150–400)
RBC: 3.7 MIL/uL — ABNORMAL LOW (ref 4.22–5.81)
RDW: 14.7 % (ref 11.5–15.5)
WBC: 6.9 10*3/uL (ref 4.0–10.5)
nRBC: 0 % (ref 0.0–0.2)

## 2020-07-31 LAB — TROPONIN I (HIGH SENSITIVITY)
Troponin I (High Sensitivity): 24 ng/L — ABNORMAL HIGH (ref <18)
Troponin I (High Sensitivity): 24 ng/L — ABNORMAL HIGH (ref <18)

## 2020-07-31 LAB — BASIC METABOLIC PANEL
Anion gap: 10 (ref 5–15)
BUN: 27 mg/dL — ABNORMAL HIGH (ref 6–20)
CO2: 23 mmol/L (ref 22–32)
Calcium: 8.3 mg/dL — ABNORMAL LOW (ref 8.9–10.3)
Chloride: 91 mmol/L — ABNORMAL LOW (ref 98–111)
Creatinine, Ser: 1.42 mg/dL — ABNORMAL HIGH (ref 0.61–1.24)
GFR calc Af Amer: >60 mL/min (ref >60)
GFR calc non Af Amer: 53 mL/min — ABNORMAL LOW (ref >60)
Glucose, Bld: 132 mg/dL — ABNORMAL HIGH (ref 70–99)
Potassium: 4.2 mmol/L (ref 3.5–5.1)
Sodium: 124 mmol/L — ABNORMAL LOW (ref 135–145)

## 2020-07-31 LAB — BRAIN NATRIURETIC PEPTIDE: B Natriuretic Peptide: 1681.4 pg/mL — ABNORMAL HIGH (ref 0.0–100.0)

## 2020-07-31 MED ORDER — SODIUM CHLORIDE 0.9% FLUSH: 3.0000 mL | Freq: Once | INTRAVENOUS | Status: DC

## 2020-07-31 MED ORDER — FUROSEMIDE 10 MG/ML IJ SOLN
60.0000 mg | Freq: Once | INTRAMUSCULAR | Status: AC
  Administered 2020-07-31: 60 mg via INTRAVENOUS
  Filled 2020-07-31: qty 6

## 2020-07-31 MED ORDER — MORPHINE SULFATE (PF) 4 MG/ML IV SOLN
4.0000 mg | Freq: Once | INTRAVENOUS | Status: AC
  Administered 2020-07-31: 4 mg via INTRAVENOUS
  Filled 2020-07-31: qty 1
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
## 2020-07-31 NOTE — ED Triage Notes (Signed)
Per EMS, pt from home, c/o increasing chest pain.  Pt was seen for the same yesterday and released.  Per EMS pt has "frequent multifocal PVC's and occasional couplets."  Bilateral leg,feet and arm and face edema noted.    166/110 - after 1 nitro 146/92 (given ASA as well) O2 94% on RA

## 2020-07-31 NOTE — ED Notes (Signed)
[phone] mother would like a call back

## 2020-08-01 ENCOUNTER — Encounter (HOSPITAL_COMMUNITY): Payer: Self-pay | Admitting: Family Medicine

## 2020-08-01 ENCOUNTER — Observation Stay (HOSPITAL_COMMUNITY): Payer: Medicaid Other

## 2020-08-01 ENCOUNTER — Telehealth: Payer: Self-pay | Admitting: Family Medicine

## 2020-08-01 DIAGNOSIS — Z7982 Long term (current) use of aspirin: Secondary | ICD-10-CM | POA: Diagnosis not present

## 2020-08-01 DIAGNOSIS — Z8249 Family history of ischemic heart disease and other diseases of the circulatory system: Secondary | ICD-10-CM | POA: Diagnosis not present

## 2020-08-01 DIAGNOSIS — Z981 Arthrodesis status: Secondary | ICD-10-CM | POA: Diagnosis not present

## 2020-08-01 DIAGNOSIS — I4821 Permanent atrial fibrillation: Secondary | ICD-10-CM | POA: Diagnosis present

## 2020-08-01 DIAGNOSIS — Z79899 Other long term (current) drug therapy: Secondary | ICD-10-CM | POA: Diagnosis not present

## 2020-08-01 DIAGNOSIS — N1831 Chronic kidney disease, stage 3a: Secondary | ICD-10-CM | POA: Diagnosis present

## 2020-08-01 DIAGNOSIS — F329 Major depressive disorder, single episode, unspecified: Secondary | ICD-10-CM | POA: Diagnosis present

## 2020-08-01 DIAGNOSIS — Z809 Family history of malignant neoplasm, unspecified: Secondary | ICD-10-CM | POA: Diagnosis not present

## 2020-08-01 DIAGNOSIS — E871 Hypo-osmolality and hyponatremia: Secondary | ICD-10-CM

## 2020-08-01 DIAGNOSIS — K219 Gastro-esophageal reflux disease without esophagitis: Secondary | ICD-10-CM | POA: Diagnosis present

## 2020-08-01 DIAGNOSIS — I428 Other cardiomyopathies: Secondary | ICD-10-CM | POA: Diagnosis present

## 2020-08-01 DIAGNOSIS — E8770 Fluid overload, unspecified: Secondary | ICD-10-CM | POA: Diagnosis not present

## 2020-08-01 DIAGNOSIS — E785 Hyperlipidemia, unspecified: Secondary | ICD-10-CM | POA: Diagnosis present

## 2020-08-01 DIAGNOSIS — Z20822 Contact with and (suspected) exposure to covid-19: Secondary | ICD-10-CM | POA: Diagnosis present

## 2020-08-01 DIAGNOSIS — R296 Repeated falls: Secondary | ICD-10-CM | POA: Diagnosis present

## 2020-08-01 DIAGNOSIS — I5023 Acute on chronic systolic (congestive) heart failure: Secondary | ICD-10-CM | POA: Diagnosis not present

## 2020-08-01 DIAGNOSIS — R079 Chest pain, unspecified: Secondary | ICD-10-CM | POA: Diagnosis present

## 2020-08-01 DIAGNOSIS — F1010 Alcohol abuse, uncomplicated: Secondary | ICD-10-CM | POA: Diagnosis present

## 2020-08-01 DIAGNOSIS — I4892 Unspecified atrial flutter: Secondary | ICD-10-CM | POA: Diagnosis present

## 2020-08-01 DIAGNOSIS — E7800 Pure hypercholesterolemia, unspecified: Secondary | ICD-10-CM | POA: Diagnosis present

## 2020-08-01 DIAGNOSIS — I509 Heart failure, unspecified: Secondary | ICD-10-CM

## 2020-08-01 DIAGNOSIS — G8929 Other chronic pain: Secondary | ICD-10-CM | POA: Diagnosis present

## 2020-08-01 DIAGNOSIS — Z79891 Long term (current) use of opiate analgesic: Secondary | ICD-10-CM | POA: Diagnosis not present

## 2020-08-01 DIAGNOSIS — Z8546 Personal history of malignant neoplasm of prostate: Secondary | ICD-10-CM | POA: Diagnosis not present

## 2020-08-01 DIAGNOSIS — F17210 Nicotine dependence, cigarettes, uncomplicated: Secondary | ICD-10-CM | POA: Diagnosis present

## 2020-08-01 DIAGNOSIS — I130 Hypertensive heart and chronic kidney disease with heart failure and stage 1 through stage 4 chronic kidney disease, or unspecified chronic kidney disease: Secondary | ICD-10-CM | POA: Diagnosis present

## 2020-08-01 DIAGNOSIS — I5043 Acute on chronic combined systolic (congestive) and diastolic (congestive) heart failure: Secondary | ICD-10-CM | POA: Diagnosis present

## 2020-08-01 DIAGNOSIS — E1140 Type 2 diabetes mellitus with diabetic neuropathy, unspecified: Secondary | ICD-10-CM | POA: Diagnosis present

## 2020-08-01 LAB — ECHOCARDIOGRAM COMPLETE
Area-P 1/2: 3.99 cm2
Height: 71 in
P 1/2 time: 923 msec
S' Lateral: 5.1 cm
Weight: 3174.62 oz

## 2020-08-01 LAB — CBC
HCT: 35.3 % — ABNORMAL LOW (ref 39.0–52.0)
Hemoglobin: 11.3 g/dL — ABNORMAL LOW (ref 13.0–17.0)
MCH: 28.9 pg (ref 26.0–34.0)
MCHC: 32 g/dL (ref 30.0–36.0)
MCV: 90.3 fL (ref 80.0–100.0)
Platelets: 323 10*3/uL (ref 150–400)
RBC: 3.91 MIL/uL — ABNORMAL LOW (ref 4.22–5.81)
RDW: 14.6 % (ref 11.5–15.5)
WBC: 7.4 10*3/uL (ref 4.0–10.5)
nRBC: 0 % (ref 0.0–0.2)

## 2020-08-01 LAB — DIGOXIN LEVEL: Digoxin Level: 0.3 ng/mL — ABNORMAL LOW (ref 0.8–2.0)

## 2020-08-01 LAB — HEMOGLOBIN A1C
Hgb A1c MFr Bld: 7.3 % — ABNORMAL HIGH (ref 4.8–5.6)
Mean Plasma Glucose: 162.81 mg/dL

## 2020-08-01 LAB — CBG MONITORING, ED
Glucose-Capillary: 173 mg/dL — ABNORMAL HIGH (ref 70–99)
Glucose-Capillary: 126 mg/dL — ABNORMAL HIGH (ref 70–99)

## 2020-08-01 LAB — CREATININE, SERUM
Creatinine, Ser: 1.34 mg/dL — ABNORMAL HIGH (ref 0.61–1.24)
GFR calc Af Amer: >60 mL/min (ref >60)
GFR calc non Af Amer: 57 mL/min — ABNORMAL LOW (ref >60)

## 2020-08-01 LAB — SARS CORONAVIRUS 2 BY RT PCR (HOSPITAL ORDER, PERFORMED IN ~~LOC~~ HOSPITAL LAB): SARS Coronavirus 2: NEGATIVE

## 2020-08-01 LAB — GLUCOSE, CAPILLARY
Glucose-Capillary: 191 mg/dL — ABNORMAL HIGH (ref 70–99)
Glucose-Capillary: 141 mg/dL — ABNORMAL HIGH (ref 70–99)

## 2020-08-01 LAB — TROPONIN I (HIGH SENSITIVITY)
Troponin I (High Sensitivity): 30 ng/L — ABNORMAL HIGH (ref <18)
Troponin I (High Sensitivity): 25 ng/L — ABNORMAL HIGH (ref <18)

## 2020-08-01 MED ORDER — AMIODARONE HCL 200 MG PO TABS
200.0000 mg | ORAL_TABLET | Freq: Every day | ORAL | Status: DC
  Administered 2020-08-01 – 2020-08-04 (×4): 200 mg via ORAL
  Filled 2020-08-01 (×4): qty 1

## 2020-08-01 MED ORDER — SODIUM CHLORIDE 0.9% FLUSH: 3.0000 mL | INTRAVENOUS | Status: DC | PRN

## 2020-08-01 MED ORDER — GABAPENTIN 300 MG PO CAPS
600.0000 mg | ORAL_CAPSULE | Freq: Two times a day (BID) | ORAL | Status: DC
  Administered 2020-08-01 – 2020-08-04 (×7): 600 mg via ORAL
  Filled 2020-08-01 (×7): qty 2

## 2020-08-01 MED ORDER — HEPARIN SODIUM (PORCINE) 5000 UNIT/ML IJ SOLN
5000.0000 [IU] | Freq: Three times a day (TID) | INTRAMUSCULAR | Status: DC
  Administered 2020-08-01 – 2020-08-04 (×10): 5000 [IU] via SUBCUTANEOUS
  Filled 2020-08-01 (×9): qty 1

## 2020-08-01 MED ORDER — SODIUM CHLORIDE 0.9 % IV SOLN: 250.0000 mL | INTRAVENOUS | Status: DC | PRN

## 2020-08-01 MED ORDER — ACETAMINOPHEN 325 MG PO TABS: 650.0000 mg | ORAL_TABLET | ORAL | Status: DC | PRN

## 2020-08-01 MED ORDER — ISOSORB DINITRATE-HYDRALAZINE 20-37.5 MG PO TABS
1.0000 | ORAL_TABLET | Freq: Three times a day (TID) | ORAL | Status: DC
  Administered 2020-08-01 – 2020-08-04 (×9): 1 via ORAL
  Filled 2020-08-01 (×13): qty 1

## 2020-08-01 MED ORDER — FUROSEMIDE 10 MG/ML IJ SOLN
60.0000 mg | Freq: Two times a day (BID) | INTRAMUSCULAR | Status: AC
  Administered 2020-08-01 – 2020-08-03 (×5): 60 mg via INTRAVENOUS
  Filled 2020-08-01 (×5): qty 6

## 2020-08-01 MED ORDER — INSULIN ASPART 100 UNIT/ML ~~LOC~~ SOLN
0.0000 [IU] | Freq: Every day | SUBCUTANEOUS | Status: DC
  Administered 2020-08-02: 2 [IU] via SUBCUTANEOUS

## 2020-08-01 MED ORDER — DIGOXIN 125 MCG PO TABS
0.1250 mg | ORAL_TABLET | Freq: Every day | ORAL | Status: DC
  Administered 2020-08-01 – 2020-08-04 (×4): 0.125 mg via ORAL
  Filled 2020-08-01 (×4): qty 1

## 2020-08-01 MED ORDER — INSULIN ASPART 100 UNIT/ML ~~LOC~~ SOLN
0.0000 [IU] | Freq: Three times a day (TID) | SUBCUTANEOUS | Status: DC
  Administered 2020-08-01 (×2): 3 [IU] via SUBCUTANEOUS
  Administered 2020-08-02: 2 [IU] via SUBCUTANEOUS
  Administered 2020-08-02: 3 [IU] via SUBCUTANEOUS
  Administered 2020-08-02: 2 [IU] via SUBCUTANEOUS
  Administered 2020-08-03 (×2): 3 [IU] via SUBCUTANEOUS
  Administered 2020-08-04: 2 [IU] via SUBCUTANEOUS
  Administered 2020-08-04: 3 [IU] via SUBCUTANEOUS
  Administered 2020-08-04: 5 [IU] via SUBCUTANEOUS

## 2020-08-01 MED ORDER — OXYCODONE-ACETAMINOPHEN 7.5-325 MG PO TABS
1.0000 | ORAL_TABLET | Freq: Four times a day (QID) | ORAL | Status: DC | PRN
  Administered 2020-08-01: 1 via ORAL
  Filled 2020-08-01 (×2): qty 1

## 2020-08-01 MED ORDER — ONDANSETRON HCL 4 MG/2ML IJ SOLN: 4.0000 mg | Freq: Four times a day (QID) | INTRAMUSCULAR | Status: DC | PRN

## 2020-08-01 MED ORDER — OXYCODONE-ACETAMINOPHEN 7.5-325 MG PO TABS
1.0000 | ORAL_TABLET | Freq: Four times a day (QID) | ORAL | Status: DC | PRN
  Administered 2020-08-01: 1 via ORAL
  Administered 2020-08-02 – 2020-08-04 (×10): 2 via ORAL
  Filled 2020-08-01 (×2): qty 2
  Filled 2020-08-01: qty 1
  Filled 2020-08-01 (×5): qty 2
  Filled 2020-08-01: qty 1
  Filled 2020-08-01: qty 2
  Filled 2020-08-01: qty 1
  Filled 2020-08-01 (×3): qty 2

## 2020-08-01 MED ORDER — ATORVASTATIN CALCIUM 10 MG PO TABS
20.0000 mg | ORAL_TABLET | Freq: Every day | ORAL | Status: DC
  Administered 2020-08-01 – 2020-08-04 (×4): 20 mg via ORAL
  Filled 2020-08-01 (×4): qty 2

## 2020-08-01 MED ORDER — SPIRONOLACTONE 12.5 MG HALF TABLET
12.5000 mg | ORAL_TABLET | Freq: Every day | ORAL | Status: DC
  Administered 2020-08-01 – 2020-08-04 (×4): 12.5 mg via ORAL
  Filled 2020-08-01 (×5): qty 1

## 2020-08-01 MED ORDER — OXYCODONE-ACETAMINOPHEN 7.5-325 MG PO TABS
1.0000 | ORAL_TABLET | Freq: Four times a day (QID) | ORAL | Status: DC | PRN
  Administered 2020-08-01: 2 via ORAL
  Filled 2020-08-01: qty 2

## 2020-08-01 MED ORDER — FUROSEMIDE 10 MG/ML IJ SOLN
80.0000 mg | Freq: Every day | INTRAMUSCULAR | Status: DC
  Administered 2020-08-01: 80 mg via INTRAVENOUS
  Filled 2020-08-01: qty 8

## 2020-08-01 MED ORDER — THIAMINE HCL 100 MG PO TABS
100.0000 mg | ORAL_TABLET | Freq: Every day | ORAL | Status: DC
  Administered 2020-08-01 – 2020-08-04 (×4): 100 mg via ORAL
  Filled 2020-08-01 (×4): qty 1

## 2020-08-01 MED ORDER — CARVEDILOL 3.125 MG PO TABS
3.1250 mg | ORAL_TABLET | Freq: Two times a day (BID) | ORAL | Status: DC
  Administered 2020-08-01 – 2020-08-02 (×3): 3.125 mg via ORAL
  Filled 2020-08-01 (×3): qty 1

## 2020-08-01 MED ORDER — SODIUM CHLORIDE 0.9% FLUSH
3.0000 mL | Freq: Two times a day (BID) | INTRAVENOUS | Status: DC
  Administered 2020-08-01 – 2020-08-04 (×7): 3 mL via INTRAVENOUS

## 2020-08-01 MED ORDER — ASPIRIN EC 81 MG PO TBEC
81.0000 mg | DELAYED_RELEASE_TABLET | Freq: Every day | ORAL | Status: DC
  Administered 2020-08-01 – 2020-08-04 (×4): 81 mg via ORAL
  Filled 2020-08-01 (×4): qty 1

## 2020-08-01 NOTE — Telephone Encounter (Signed)
Call returned to the patient. He explained that he moved in with a new neighbor but it has not worked out.  He said he is considering moving into a motel until he is able to get public housing. He understands that the cost of a motel room is about $600/month but he thinks he can afford that.

## 2020-08-01 NOTE — Progress Notes (Signed)
Patient admitted by Dr. Clearence Ped earlier this morning  Patient seen and examined.  He reports good urine output.  Does not feel that respiratory status is back to baseline.  Still feels short of breath on exertion.  He has noted an increase in weight of 10 pounds over the past 5 days.  Currently on IV Lasix.  He does have some crackles at bases.  Continues to have 1-2+ edema bilaterally.  Assessment/plan  1. Acute on chronic systolic congestive heart failure.  Last ejection fraction was noted to be 35 to 40% on echocardiogram from 03/2020.  He reports compliance with diuretics, fluid restriction and salt restriction at home. He normally takes torsemide 40 mg p.o. daily.  Will start on intravenous Lasix 60 mg twice daily.  He is on beta-blockers, digoxin and Aldactone.  Continue to monitor intake and output. 2. Chronic atrial fibrillation.  Not on anticoagulation due to heavy alcohol abuse and frequent falls.  Heart rate is currently stable.  Continue on amiodarone and digoxin. 3. Diabetes.  Chronically on glipizide.  Will hold for now.  Continue to monitor on sliding scale insulin. 4. Hyponatremia.  Patient has chronic hyponatremia with baseline sodium 1 27-1 30.  Currently in the setting of hypervolemia, will continue diuresis and monitor serum sodium. 5. Cervical spinal stenosis status post ACDF on 07/24/2020.  Continue pain management and follow-up with neurosurgery. 6. Chronic HCV with liver cirrhosis.  Stable.  [employer]

## 2020-08-01 NOTE — Plan of Care (Signed)
  Problem: Education: Goal: Knowledge of General Education information will improve Description: Including pain rating scale, medication(s)/side effects and non-pharmacologic comfort measures Reactivated   

## 2020-08-01 NOTE — Telephone Encounter (Signed)
Copied from [place] [phone]. Topic: Quick Communication - See Telephone Encounter >> Aug 01, 2020  3:42 PM Loma Boston wrote: CRM for notification. See Telephone encounter for: 08/01/20. PT called for Brad [name], states in [place] and he is in [place]m [phone] and he would like her to call his room # if possible, would not share info. [phone]   Please Follow up with Patient

## 2020-08-01 NOTE — Progress Notes (Signed)
  Echocardiogram 2D Echocardiogram has been performed.  Brad [name] 08/01/2020, 1:41 PM

## 2020-08-01 NOTE — Plan of Care (Signed)
  Problem: Clinical Measurements: Goal: Ability to maintain clinical measurements within normal limits will improve Outcome: Progressing Goal: Will remain free from infection Outcome: Progressing   Problem: Clinical Measurements: Goal: Will remain free from infection Outcome: Progressing   

## 2020-08-01 NOTE — H&P (Signed)
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 diet 4. Continue monitor 3. Atrial fibrillation 1. Continue amiodarone and digoxin    DVT Prophylaxis-   Heparin- SCDs   AM Labs Ordered, also please review Full Orders  Family Communication: No Family at bedside Code Status:  Full  Admission status: ObservationTime spent in minutes : [place] DO

## 2020-08-02 DIAGNOSIS — F1010 Alcohol abuse, uncomplicated: Secondary | ICD-10-CM

## 2020-08-02 DIAGNOSIS — E8770 Fluid overload, unspecified: Secondary | ICD-10-CM

## 2020-08-02 DIAGNOSIS — I509 Heart failure, unspecified: Secondary | ICD-10-CM

## 2020-08-02 LAB — CBC
HCT: 31.6 % — ABNORMAL LOW (ref 39.0–52.0)
Hemoglobin: 10.1 g/dL — ABNORMAL LOW (ref 13.0–17.0)
MCH: 28.7 pg (ref 26.0–34.0)
MCHC: 32 g/dL (ref 30.0–36.0)
MCV: 89.8 fL (ref 80.0–100.0)
Platelets: 305 10*3/uL (ref 150–400)
RBC: 3.52 MIL/uL — ABNORMAL LOW (ref 4.22–5.81)
RDW: 14.5 % (ref 11.5–15.5)
WBC: 5.1 10*3/uL (ref 4.0–10.5)
nRBC: 0 % (ref 0.0–0.2)

## 2020-08-02 LAB — BASIC METABOLIC PANEL
Anion gap: 10 (ref 5–15)
BUN: 35 mg/dL — ABNORMAL HIGH (ref 6–20)
CO2: 24 mmol/L (ref 22–32)
Calcium: 8.5 mg/dL — ABNORMAL LOW (ref 8.9–10.3)
Chloride: 95 mmol/L — ABNORMAL LOW (ref 98–111)
Creatinine, Ser: 1.67 mg/dL — ABNORMAL HIGH (ref 0.61–1.24)
GFR calc Af Amer: 51 mL/min — ABNORMAL LOW (ref >60)
GFR calc non Af Amer: 44 mL/min — ABNORMAL LOW (ref >60)
Glucose, Bld: 155 mg/dL — ABNORMAL HIGH (ref 70–99)
Potassium: 4.3 mmol/L (ref 3.5–5.1)
Sodium: 129 mmol/L — ABNORMAL LOW (ref 135–145)

## 2020-08-02 LAB — MAGNESIUM: Magnesium: 1.7 mg/dL (ref 1.7–2.4)

## 2020-08-02 LAB — GLUCOSE, CAPILLARY
Glucose-Capillary: 140 mg/dL — ABNORMAL HIGH (ref 70–99)
Glucose-Capillary: 143 mg/dL — ABNORMAL HIGH (ref 70–99)
Glucose-Capillary: 150 mg/dL — ABNORMAL HIGH (ref 70–99)
Glucose-Capillary: 167 mg/dL — ABNORMAL HIGH (ref 70–99)
Glucose-Capillary: 147 mg/dL — ABNORMAL HIGH (ref 70–99)
Glucose-Capillary: 209 mg/dL — ABNORMAL HIGH (ref 70–99)

## 2020-08-02 MED ORDER — VITAMIN B-12 1000 MCG PO TABS
1000.0000 ug | ORAL_TABLET | Freq: Every day | ORAL | Status: DC
  Administered 2020-08-02 – 2020-08-04 (×3): 1000 ug via ORAL
  Filled 2020-08-02 (×3): qty 1

## 2020-08-02 MED ORDER — MAGNESIUM OXIDE 400 (241.3 MG) MG PO TABS
400.0000 mg | ORAL_TABLET | Freq: Every day | ORAL | Status: DC
  Administered 2020-08-02 – 2020-08-04 (×3): 400 mg via ORAL
  Filled 2020-08-02 (×3): qty 1

## 2020-08-02 NOTE — Progress Notes (Signed)
Progress Note    Brad [name]  OXB:[id number] DOB: 05-09-1961  DOA: 07/31/2020 PCP: Charlott Rakes, MD    Brief Narrative:     Medical records reviewed and are as summarized below:  Brad [name] is an 59 y.o. male  with history of tobacco abuse, nonischemic cardiomyopathy, hypertension, history of drug abuse, hyperlipidemia, GERD, diabetes mellitus, CHF, atrial fibrillation, and more presents to the ED with a chief complaint of swollen legs and difficulty breathing.  Patient reports sudden onset yesterday of the symptoms.  He reports that he has had chest pressure in the middle of his chest.  He rates the pressure at 10 out of 10, and reports no radiation no nausea, constant pain.  He is adamant about requesting pain medication.  He reports shortness of breath, with no cough no fever.  Laying flat makes the shortness of breath worse, he has not been able to evaluate the shortness of breath with exertion because he has been "taking it easy" since his neck surgery last week.  Patient reports that his whole body hurts, starting from the incision at his neck going all the way down his body.   Assessment/Plan:   Active Problems:   CHF exacerbation (HCC)   Acute on chronic systolic congestive heart failure.  Last ejection fraction was noted to be 35 to 40% on echocardiogram from 03/2020.  He reports compliance with diuretics, fluid restriction and salt restriction at home. He normally takes torsemide 40 mg p.o. daily.  Will start on intravenous Lasix 60 mg twice daily.   - digoxin and Aldactone.   -Continue to monitor intake and output and daily weights  Chronic atrial fibrillation.  Not on anticoagulation due to heavy alcohol abuse and frequent falls.  per chart review no longer on coreg due to bradycardia.  Continue on amiodarone and digoxin.  Diabetes.  Chronically on glipizide.  Will hold for now.  Continue to monitor on sliding scale insulin.  Hyponatremia.  Patient has  chronic hyponatremia with baseline sodium 127-130.  Currently in the setting of hypervolemia, will continue diuresis and monitor serum sodium.  Cervical spinal stenosis status post ACDF on 07/24/2020.  Continue pain management and follow-up with neurosurgery.  Chronic HCV with liver cirrhosis.  Stable.  Alcohol abuse -watch for withdrawal  Family Communication/Anticipated D/C date and plan/Code Status   DVT prophylaxis: heparin Code Status: Full Code.  Disposition Plan: Status is: Inpatient  Remains inpatient appropriate because:IV treatments appropriate due to intensity of illness or inability to take PO   Dispo: The patient is from: Home              Anticipated d/c is to: Home              Anticipated d/c date is: 1 day              Patient currently is not medically stable to d/c.         Medical Consultants:    None.   Anti-Infectives:    None  Subjective:   C/o neck pain better with 2 percocet-- wants to be sure he gets them at home as well  Objective:    Vitals:   08/01/20 2329 08/02/20 0544 08/02/20 0830 08/02/20 1248  BP: 113/69 115/80  (!) 95/56  Pulse: 72 62 65 60  Resp: 18 18  18   Temp: (!) 97.4 F (36.3 C) 97.7 F (36.5 C)  98.5 F (36.9 C)  TempSrc: Oral Oral  Oral  SpO2:  98% 100%  97%  Weight:  80.6 kg    Height:        Intake/Output Summary (Last 24 hours) at 08/02/2020 1506 Last data filed at 08/02/2020 0831 Gross per 24 hour  Intake 723 ml  Output 1300 ml  Net -577 ml   Filed Weights   07/31/20 0351 08/02/20 0544  Weight: 90 kg 80.6 kg    Exam:  General: Appearance:    Well developed, well nourished male in no acute distress     Lungs:     Clear to auscultation bilaterally, respirations unlabored  Heart:   bradycardic  MS:   All extremities are intact. +LE edema  Neurologic:   Awake, alert, oriented x 3. No apparent focal neurological           defect.     Data Reviewed:   I have personally reviewed following labs and  imaging studies:  Labs: Labs show the following:   Basic Metabolic Panel: Recent Labs  Lab 07/27/20 0338 07/30/20 0644 07/30/20 0644 07/31/20 0400 08/01/20 0445 08/02/20 0536  NA 131* 126*  --  124*  --  129*  K  --  4.3   < > 4.2  --  4.3  CL  --  93*  --  91*  --  95*  CO2  --  20*  --  23  --  24  GLUCOSE  --  169*  --  132*  --  155*  BUN  --  27*  --  27*  --  35*  CREATININE  --  1.33*  --  1.42* 1.34* 1.67*  CALCIUM  --  8.4*  --  8.3*  --  8.5*  MG 1.6*  --   --   --   --  1.7   < > = values in this interval not displayed.   GFR Estimated Creatinine Clearance: 50.1 mL/min (A) (by C-G formula based on SCr of 1.67 mg/dL (H)). Liver Function Tests: Recent Labs  Lab 07/30/20 0644  AST 43*  ALT 23  ALKPHOS 68  BILITOT 0.3  PROT 6.3*  ALBUMIN 2.5*   No results for input(s): LIPASE, AMYLASE in the last 168 hours. No results for input(s): AMMONIA in the last 168 hours. Coagulation profile No results for input(s): INR, PROTIME in the last 168 hours.  CBC: Recent Labs  Lab 07/30/20 0644 07/31/20 0400 08/01/20 0445 08/02/20 0536  WBC 7.8 6.9 7.4 5.1  NEUTROABS 6.3  --   --   --   HGB 10.2* 10.9* 11.3* 10.1*  HCT 32.0* 33.3* 35.3* 31.6*  MCV 90.9 90.0 90.3 89.8  PLT 244 331 323 305   Cardiac Enzymes: No results for input(s): CKTOTAL, CKMB, CKMBINDEX, TROPONINI in the last 168 hours. BNP (last 3 results) No results for input(s): PROBNP in the last 8760 hours. CBG: Recent Labs  Lab 08/01/20 2128 08/02/20 0435 08/02/20 0537 08/02/20 0622 08/02/20 1120  GLUCAP 141* 140* 143* 150* 167*   D-Dimer: No results for input(s): DDIMER in the last 72 hours. Hgb A1c: Recent Labs    08/01/20 0445  HGBA1C 7.3*   Lipid Profile: No results for input(s): CHOL, HDL, LDLCALC, TRIG, CHOLHDL, LDLDIRECT in the last 72 hours. Thyroid function studies: No results for input(s): TSH, T4TOTAL, T3FREE, THYROIDAB in the last 72 hours.  Invalid input(s): FREET3 Anemia  work up: No results for input(s): VITAMINB12, FOLATE, FERRITIN, TIBC, IRON, RETICCTPCT in the last 72 hours. Sepsis Labs: Recent Labs  Lab  07/30/20 0644 07/31/20 0400 08/01/20 0445 08/02/20 0536  WBC 7.8 6.9 7.4 5.1    Microbiology Recent Results (from the past 240 hour(s))  Surgical pcr screen     Status: Abnormal   Collection Time: 07/23/20  5:17 PM   Specimen: Nasal Mucosa; Nasal Swab  Result Value Ref Range Status   MRSA, PCR NEGATIVE NEGATIVE Final   Staphylococcus aureus POSITIVE (A) NEGATIVE Final    Comment: (NOTE) The Xpert SA Assay (FDA approved for NASAL specimens in patients 62 years of age and older), is one component of a comprehensive surveillance program. It is not intended to diagnose infection nor to guide or monitor treatment. Performed at [facility], [place] [address] [place], [place] [address]   SARS Coronavirus 2 by RT PCR (hospital order, performed in [facility] hospital lab) Nasopharyngeal Nasopharyngeal Swab     Status: None   Collection Time: 08/01/20  1:25 AM   Specimen: Nasopharyngeal Swab  Result Value Ref Range Status   SARS Coronavirus 2 NEGATIVE NEGATIVE Final    Comment: (NOTE) SARS-CoV-2 target nucleic acids are NOT DETECTED.  The SARS-CoV-2 RNA is generally detectable in upper and lower respiratory specimens during the acute phase of infection. The lowest concentration of SARS-CoV-2 viral copies this assay can detect is 250 copies / mL. A negative result does not preclude SARS-CoV-2 infection and should not be used as the sole basis for treatment or other patient management decisions.  A negative result may occur with improper specimen collection / handling, submission of specimen other than nasopharyngeal swab, presence of viral mutation(s) within the areas targeted by this assay, and inadequate number of viral copies (<250 copies / mL). A negative result must be combined with clinical observations, patient history, and  epidemiological information.  Fact Sheet for Patients:   StrictlyIdeas.no  Fact Sheet for Healthcare Providers: BankingDealers.co.za  This test is not yet approved or  cleared by the [place] FDA and has been authorized for detection and/or diagnosis of SARS-CoV-2 by FDA under an Emergency Use Authorization (EUA).  This EUA will remain in effect (meaning this test can be used) for the duration of the COVID-19 declaration under Section 564(b)(1) of the Act, 21 U.S.C. section 360bbb-3(b)(1), unless the authorization is terminated or revoked sooner.  Performed at [facility], [place] [address], [place], [place] [address]     Procedures and diagnostic studies:  ECHOCARDIOGRAM COMPLETE  Result Date: 08/01/2020    ECHOCARDIOGRAM REPORT   Patient Name:   CANTRELL LAROUCHE Date of Exam: 08/01/2020 Medical Rec #:  [id number]       Height:       71.0 in Accession #:    [id number]      Weight:       198.4 lb Date of Birth:  December 07, 1961      BSA:          2.101 m Patient Age:    74 years        BP:           130/100 mmHg Patient Gender: M               HR:           70 bpm. Exam Location:  Inpatient Procedure: 2D Echo, Cardiac Doppler and Color Doppler Indications:    CHF  History:        Patient has prior history of Echocardiogram examinations, most  recent 04/03/2020. CHF and Cardiomyopathy, Arrythmias:Atrial                 Fibrillation, Signs/Symptoms:Shortness of Breath; Risk                 Factors:Current Smoker, Hypertension, Dyslipidemia and Diabetes.                 Cocaine abuse.  Sonographer:    Dustin Flock Referring Phys: [phone] ASIA B [place]  1. Left ventricular ejection fraction, by estimation, is 30 to 35%. The left ventricle has moderately decreased function. The left ventricle demonstrates global hypokinesis. The left ventricular internal cavity size was mildly dilated. There is mild left  ventricular hypertrophy. Left ventricular diastolic parameters are indeterminate.  2. Right ventricular systolic function is mildly reduced. The right ventricular size is normal. There is moderately elevated pulmonary artery systolic pressure. The estimated right ventricular systolic pressure is 29.9 mmHg.  3. Left atrial size was moderately dilated.  4. Right atrial size was moderately dilated.  5. The mitral valve is normal in structure. Moderate mitral valve regurgitation, suspect functional. No evidence of mitral stenosis.  6. The aortic valve is tricuspid. Aortic valve regurgitation is mild. No aortic stenosis is present.  7. The inferior vena cava is dilated in size with <50% respiratory variability, suggesting right atrial pressure of 15 mmHg.  8. The patient was in atrial fibrillation. FINDINGS  Left Ventricle: Left ventricular ejection fraction, by estimation, is 30 to 35%. The left ventricle has moderately decreased function. The left ventricle demonstrates global hypokinesis. The left ventricular internal cavity size was mildly dilated. There is mild left ventricular hypertrophy. Left ventricular diastolic parameters are indeterminate. Right Ventricle: The right ventricular size is normal. No increase in right ventricular wall thickness. Right ventricular systolic function is mildly reduced. There is moderately elevated pulmonary artery systolic pressure. The tricuspid regurgitant velocity is 3.14 m/s, and with an assumed right atrial pressure of 15 mmHg, the estimated right ventricular systolic pressure is 24.2 mmHg. Left Atrium: Left atrial size was moderately dilated. Right Atrium: Right atrial size was moderately dilated. Pericardium: Trivial pericardial effusion is present. Mitral Valve: The mitral valve is normal in structure. Mild mitral annular calcification. Moderate mitral valve regurgitation. No evidence of mitral valve stenosis. Tricuspid Valve: The tricuspid valve is normal in structure.  Tricuspid valve regurgitation is mild. Aortic Valve: The aortic valve is tricuspid. Aortic valve regurgitation is mild. Aortic regurgitation PHT measures 923 msec. No aortic stenosis is present. Pulmonic Valve: The pulmonic valve was normal in structure. Pulmonic valve regurgitation is trivial. Aorta: The aortic root is normal in size and structure. Venous: The inferior vena cava is dilated in size with less than 50% respiratory variability, suggesting right atrial pressure of 15 mmHg. IAS/Shunts: No atrial level shunt detected by color flow Doppler.  LEFT VENTRICLE PLAX 2D LVIDd:         5.90 cm  Diastology LVIDs:         5.10 cm  LV e' lateral:   8.35 cm/s LV PW:         1.30 cm  LV E/e' lateral: 13.9 LV IVS:        1.30 cm  LV e' medial:    6.89 cm/s LVOT diam:     2.40 cm  LV E/e' medial:  16.8 LV SV:         82 LV SV Index:   39 LVOT Area:     4.52 cm  RIGHT  VENTRICLE RV Basal diam:  3.90 cm RV S prime:     8.67 cm/s TAPSE (M-mode): 2.8 cm LEFT ATRIUM            Index       RIGHT ATRIUM           Index LA diam:      5.20 cm  2.47 cm/m  RA Area:     30.20 cm LA Vol (A2C): 75.3 ml  35.83 ml/m RA Volume:   127.00 ml 60.43 ml/m LA Vol (A4C): 120.0 ml 57.10 ml/m  AORTIC VALVE LVOT Vmax:   106.00 cm/s LVOT Vmean:  52.300 cm/s LVOT VTI:    0.182 m AI PHT:      923 msec  AORTA Ao Root diam: 3.60 cm MITRAL VALVE                TRICUSPID VALVE MV Area (PHT): 3.99 cm     TR Peak grad:   39.4 mmHg MV Decel Time: 190 msec     TR Vmax:        314.00 cm/s MV E velocity: 116.00 cm/s MV A velocity: 51.40 cm/s   SHUNTS MV E/A ratio:  2.26         Systemic VTI:  0.18 m                             Systemic Diam: 2.40 cm Loralie Champagne MD Electronically signed by Loralie Champagne MD Signature Date/Time: 08/01/2020/6:43:29 PM    Final     Medications:   . amiodarone  200 mg Oral Daily  . aspirin EC  81 mg Oral Daily  . atorvastatin  20 mg Oral q1800  . digoxin  0.125 mg Oral Daily  . furosemide  60 mg Intravenous BID  .  gabapentin  600 mg Oral BID  . heparin  5,000 Units Subcutaneous Q8H  . insulin aspart  0-15 Units Subcutaneous TID WC  . insulin aspart  0-5 Units Subcutaneous QHS  . isosorbide-hydrALAZINE  1 tablet Oral TID  . sodium chloride flush  3 mL Intravenous Once  . sodium chloride flush  3 mL Intravenous Q12H  . spironolactone  12.5 mg Oral Daily  . thiamine  100 mg Oral Daily  . vitamin B-12  1,000 mcg Oral Daily   Continuous Infusions: . sodium chloride       LOS: 1 day   Geradine Girt  [facility]   How to contact the [facility] Attending or Consulting provider [place] or covering provider during after hours [place], for this patient?  1. Check the care team in [facility] and look for a) attending/consulting [facility] provider listed and b) the [facility] team listed 2. Log into [email or web address] and use [place]'s universal password to access. If you do not have the password, please contact the hospital operator. 3. Locate the [facility] provider you are looking for under [facility] and page to a number that you can be directly reached. 4. If you still have difficulty reaching the provider, please page the [facility] (Director on Call) for the Hospitalists listed on amion for assistance.  08/02/2020, 3:06 PM

## 2020-08-02 NOTE — Progress Notes (Signed)
Outpatient CSW following pt through [place] program.  Met with pt at bedside to assist with a few of pt current concerns.  Pt reports he has lost his phone and can't afford another one at this time (gets his check 8/26 he thinks).  Pt has phone plan through [employer] and CSW and pt called them together to discuss phone prices at one of their stores- report they have $40 phones.  CSW explained to pt that I could likely meet pt at a store when he discharges if it is during the week to get him a phone through patient care fund.  Pt also discussed DC plans with CSW.  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  Pt also requested CSW help him follow up with his neurosurgeon who completed his surgery last week- thinks he was supposed to get staples removed on Monday but has been unable to call and make an appt.  CSW called their office and explained pt back in the hospital and inquired about making an appt for next week vs a physician coming by the hospital to remove staples while he is inpatient.  They will contact rounding physician to inquire.  CSW informed them that pt does not currently have a phone so requested they contact me in case they need to schedule him an appt.  CSW will plan to touch base with pt tomorrow to help move forward with these plans depending on anticipated DC.  [name], [facility] Desk#: [phone] Cell#: [phone]

## 2020-08-02 NOTE — Telephone Encounter (Signed)
Pt called and is requesting to speak with [name] or [place]. Please advise,

## 2020-08-02 NOTE — Evaluation (Signed)
Physical Therapy Evaluation Patient Details Name: Brad [name] MRN: [id number] DOB: May 27, 1961 Today's Date: 08/02/2020   History of Present Illness  Pt is 59 yo male with hx of nonischemic cardiomyopathy, HTN, hyperlipidemia, GERD, tobacco abuse, DM, CHF, afib, and past hx of drug abuse.  Additionally pt with admission in early August with rib fractures, R LE myoclonic jerking, hyponatremia, and required C4-6 ACDF for cord emema from recent fall. Pt now presents with LE edema and difficulty breathing and admitteed with CHF.  Clinical Impression  Pt admitted with above diagnosis. Pt is mobilizing fairly well with supervision; however, impulsive at times and needs frequent cues for cervical precautions due to recent surgery.  Additionally, pt with limited support at discharge and currently unsure of where he will go at discharge due to unsafe/unsanitary prior living environment (per pt).  Pt does present with neck and R arm pain, bil foot pain, decreased mobility, safety awareness, balance, and decreased shoulder ROM. Pt currently with functional limitations due to the deficits listed below (see PT Problem List). Pt will benefit from skilled PT to increase their independence and safety with mobility to allow discharge with intermittent supervision.      Follow Up Recommendations Supervision - Intermittent     Equipment Recommendations  None recommended by PT    Recommendations for Other Services       Precautions / Restrictions Precautions Precautions: Cervical;Fall Restrictions Other Position/Activity Restrictions: Per pt, he was told he no longer had to wear cervical collar but still had precautions      Mobility  Bed Mobility Overal bed mobility: Independent             General bed mobility comments: EOB at arrival  Transfers Overall transfer level: Needs assistance Equipment used: None Transfers: Sit to/from Stand Sit to Stand: Supervision         General transfer  comment: Pt verbalized upon standing "I like and they have told me to stand for a few mins to make sure I'm not dizzy"  Ambulation/Gait Ambulation/Gait assistance: Supervision Gait Distance (Feet): 350 Feet Assistive device: None Gait Pattern/deviations: Step-through pattern     General Gait Details: mild drifting first started walking but recovered and then demonstrated reciprocal gait without LOB  Stairs            Wheelchair Mobility    Modified [name] (Stroke Patients Only)       Balance Overall balance assessment: Needs assistance   Sitting balance-Leahy Scale: Normal     Standing balance support: No upper extremity supported Standing balance-Leahy Scale: Good                               Pertinent Vitals/Pain Pain Assessment: 0-10 Pain Score: 6  Pain Location: neck and down R arm; chest; and bil feet/ankles Pain Descriptors / Indicators: Sore Pain Intervention(s): Limited activity within patient's tolerance;Monitored during session    Home Living Family/patient expects to be discharged to:: Unsure               Home Equipment: None Additional Comments: Pt unsure of where he will discharge. Reports current living condition with roommate is unsafe/unsanitary and no one wants him to return there.  May stay with a brother or get a hotel.  Reports he would mostly be on his own in regards to supervision/assist.    Prior Function Level of Independence: Independent  Hand Dominance        Extremity/Trunk Assessment   Upper Extremity Assessment Upper Extremity Assessment: LUE deficits/detail;RUE deficits/detail RUE Deficits / Details: Bil shoulder elevation limited to 90 due to rib and neck pain; MMT at least 3/5 but not further tested due to pain LUE Deficits / Details: Bil shoulder elevation limited to 90 due to rib and neck pain; MMT at least 3/5 but not further tested due to pain    Lower Extremity Assessment Lower  Extremity Assessment: LLE deficits/detail;RLE deficits/detail RLE Deficits / Details: ROM WFL and MMT 5/5; + edema in lower leg LLE Deficits / Details: ROM WFL and MMT 5/5; + edema in lower leg    Cervical / Trunk Assessment Cervical / Trunk Assessment:  (forward head)  Communication   Communication: No difficulties  Cognition Arousal/Alertness: Awake/alert Behavior During Therapy: Impulsive Overall Cognitive Status: Within Functional Limits for tasks assessed                                 General Comments: Pt alert and oriented x 4.  He is aware of his cervical precautions but needs cues to maintain with transfers and when turning to look at tv/out window      General Comments General comments (skin integrity, edema, etc.): Pt needing cues for cervical precautions throughout evaluation.    Exercises     Assessment/Plan    PT Assessment Patient needs continued PT services  PT Problem List Decreased range of motion;Decreased activity tolerance;Decreased mobility;Decreased knowledge of use of DME;Decreased cognition;Decreased safety awareness;Decreased knowledge of precautions;Impaired sensation;Pain;Decreased strength;Decreased balance       PT Treatment Interventions DME instruction;Gait training;Stair training;Functional mobility training;Therapeutic activities;Cognitive remediation;Patient/family education;Therapeutic exercise;Balance training    PT Goals (Current goals can be found in the Care Plan section)  Acute Rehab PT Goals Patient Stated Goal: figure out why he keeps swelling PT Goal Formulation: With patient Time For Goal Achievement: 08/16/20 Potential to Achieve Goals: Good    Frequency Min 3X/week   Barriers to discharge Decreased caregiver support;Inaccessible home environment Pt unsure of where he will go at d/c - reports current environment not safe    Co-evaluation               AM-PAC PT "6 Clicks" Mobility  Outcome Measure Help  needed turning from your back to your side while in a flat bed without using bedrails?: None Help needed moving from lying on your back to sitting on the side of a flat bed without using bedrails?: None Help needed moving to and from a bed to a chair (including a wheelchair)?: None Help needed standing up from a chair using your arms (e.g., wheelchair or bedside chair)?: None Help needed to walk in hospital room?: None Help needed climbing 3-5 steps with a railing? : A Little 6 Click Score: 23    End of Session   Activity Tolerance: Patient tolerated treatment well Patient left: in bed;with call bell/phone within reach (declined bed alarm) Nurse Communication: Mobility status PT Visit Diagnosis: Other abnormalities of gait and mobility (R26.89);History of falling (Z91.81)    Time: 1700-1726 PT Time Calculation (min) (ACUTE ONLY): 26 min   Charges:   PT Evaluation $PT Eval Low Complexity: 1 Low PT Treatments $Therapeutic Activity: 8-22 mins        Abran Richard, PT Acute Rehab Services Pager [phone] [facility] [phone]    Karlton Lemon 08/02/2020, 5:51 PM

## 2020-08-03 LAB — BASIC METABOLIC PANEL WITH GFR
Anion gap: 12 (ref 5–15)
BUN: 39 mg/dL — ABNORMAL HIGH (ref 6–20)
CO2: 24 mmol/L (ref 22–32)
Calcium: 8.7 mg/dL — ABNORMAL LOW (ref 8.9–10.3)
Chloride: 94 mmol/L — ABNORMAL LOW (ref 98–111)
Creatinine, Ser: 1.71 mg/dL — ABNORMAL HIGH (ref 0.61–1.24)
GFR calc Af Amer: 49 mL/min — ABNORMAL LOW
GFR calc non Af Amer: 43 mL/min — ABNORMAL LOW
Glucose, Bld: 162 mg/dL — ABNORMAL HIGH (ref 70–99)
Potassium: 4.5 mmol/L (ref 3.5–5.1)
Sodium: 130 mmol/L — ABNORMAL LOW (ref 135–145)

## 2020-08-03 LAB — GLUCOSE, CAPILLARY
Glucose-Capillary: 161 mg/dL — ABNORMAL HIGH (ref 70–99)
Glucose-Capillary: 158 mg/dL — ABNORMAL HIGH (ref 70–99)
Glucose-Capillary: 113 mg/dL — ABNORMAL HIGH (ref 70–99)
Glucose-Capillary: 171 mg/dL — ABNORMAL HIGH (ref 70–99)

## 2020-08-03 IMAGING — CR CHEST - 2 VIEW
2 series · 2 of 2 positions shown · non-contrast
Comparison: Two-view chest 07/28/2019

CLINICAL DATA: Shortness of breath. Cough. Upper and lower
extremity swelling.

EXAM:
CHEST - 2 VIEW

[w chest pa]
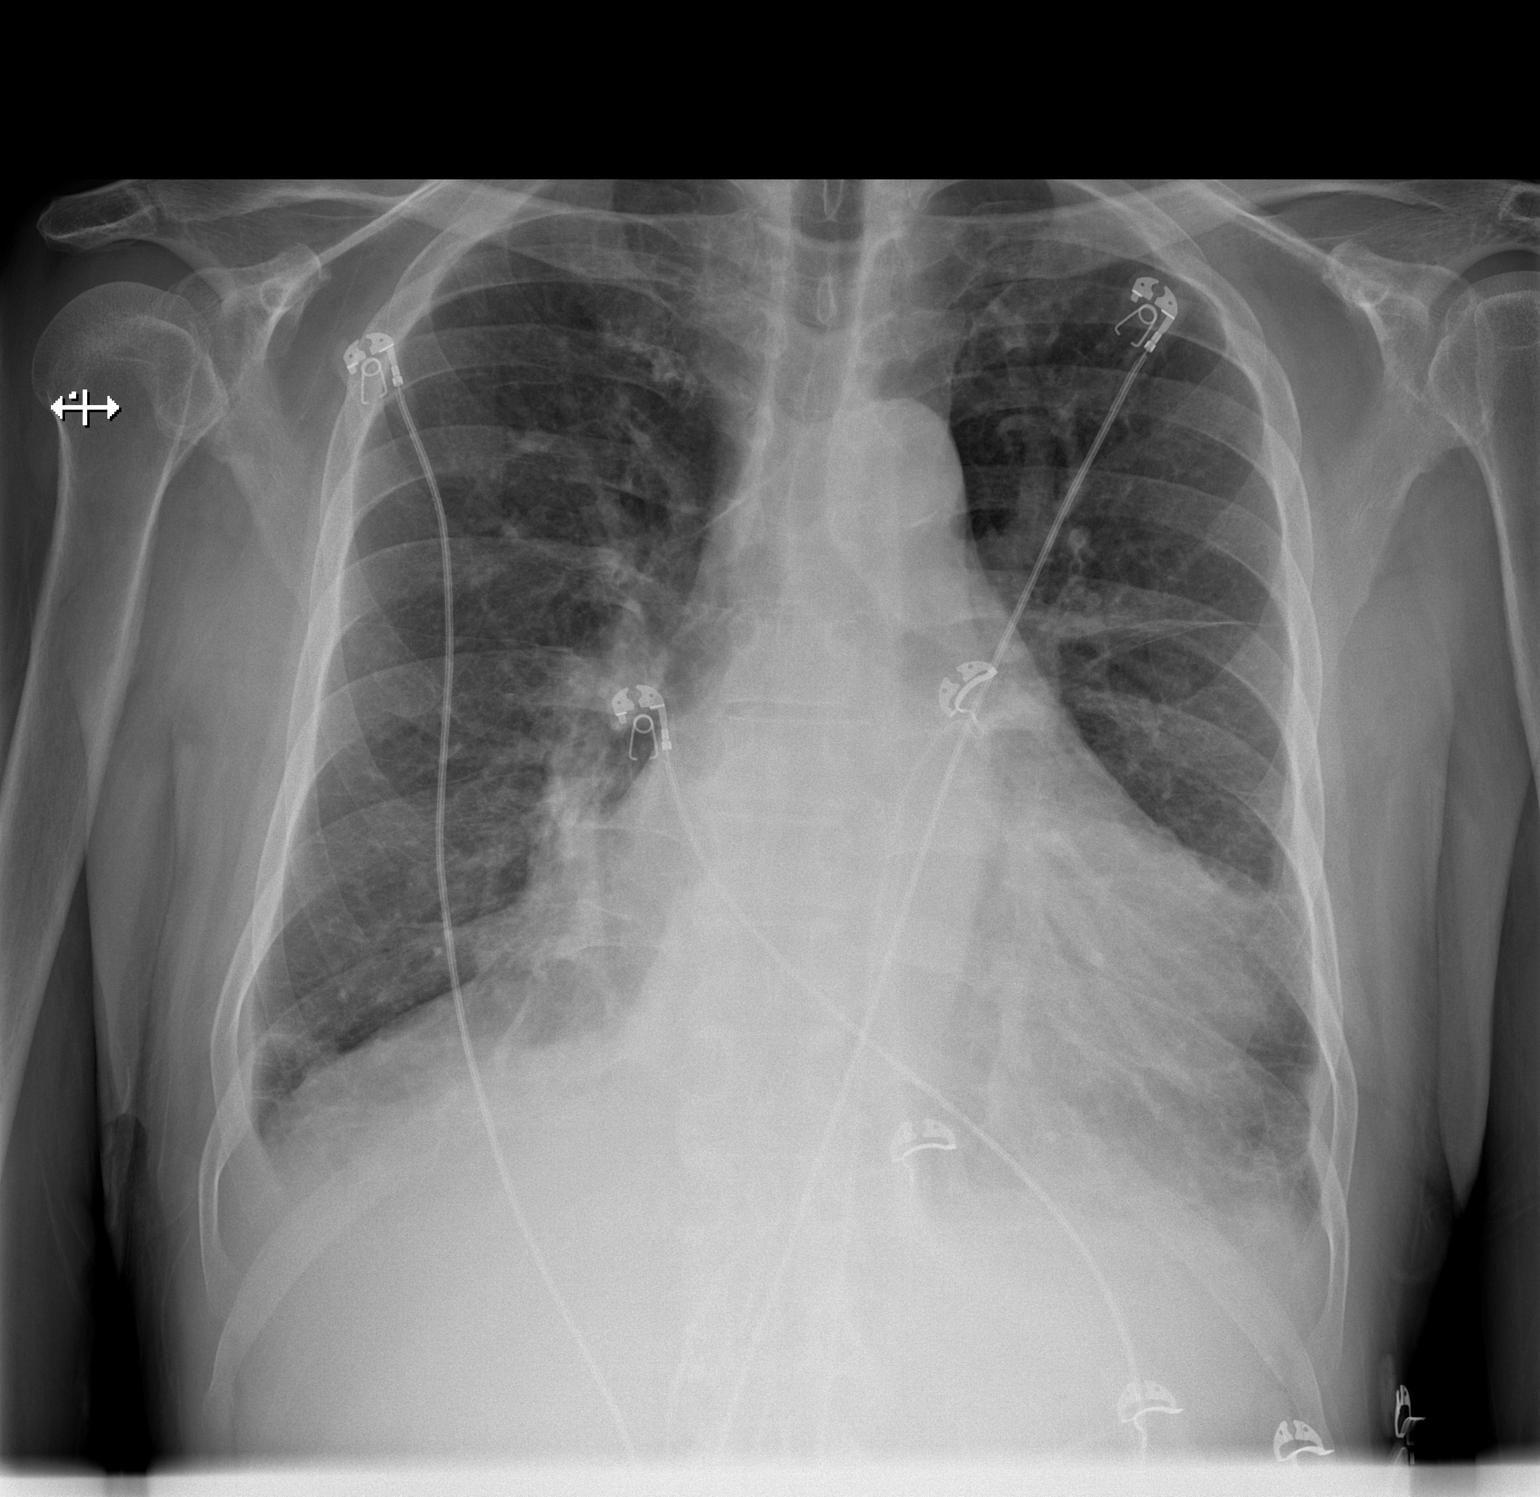

[w chest lat]
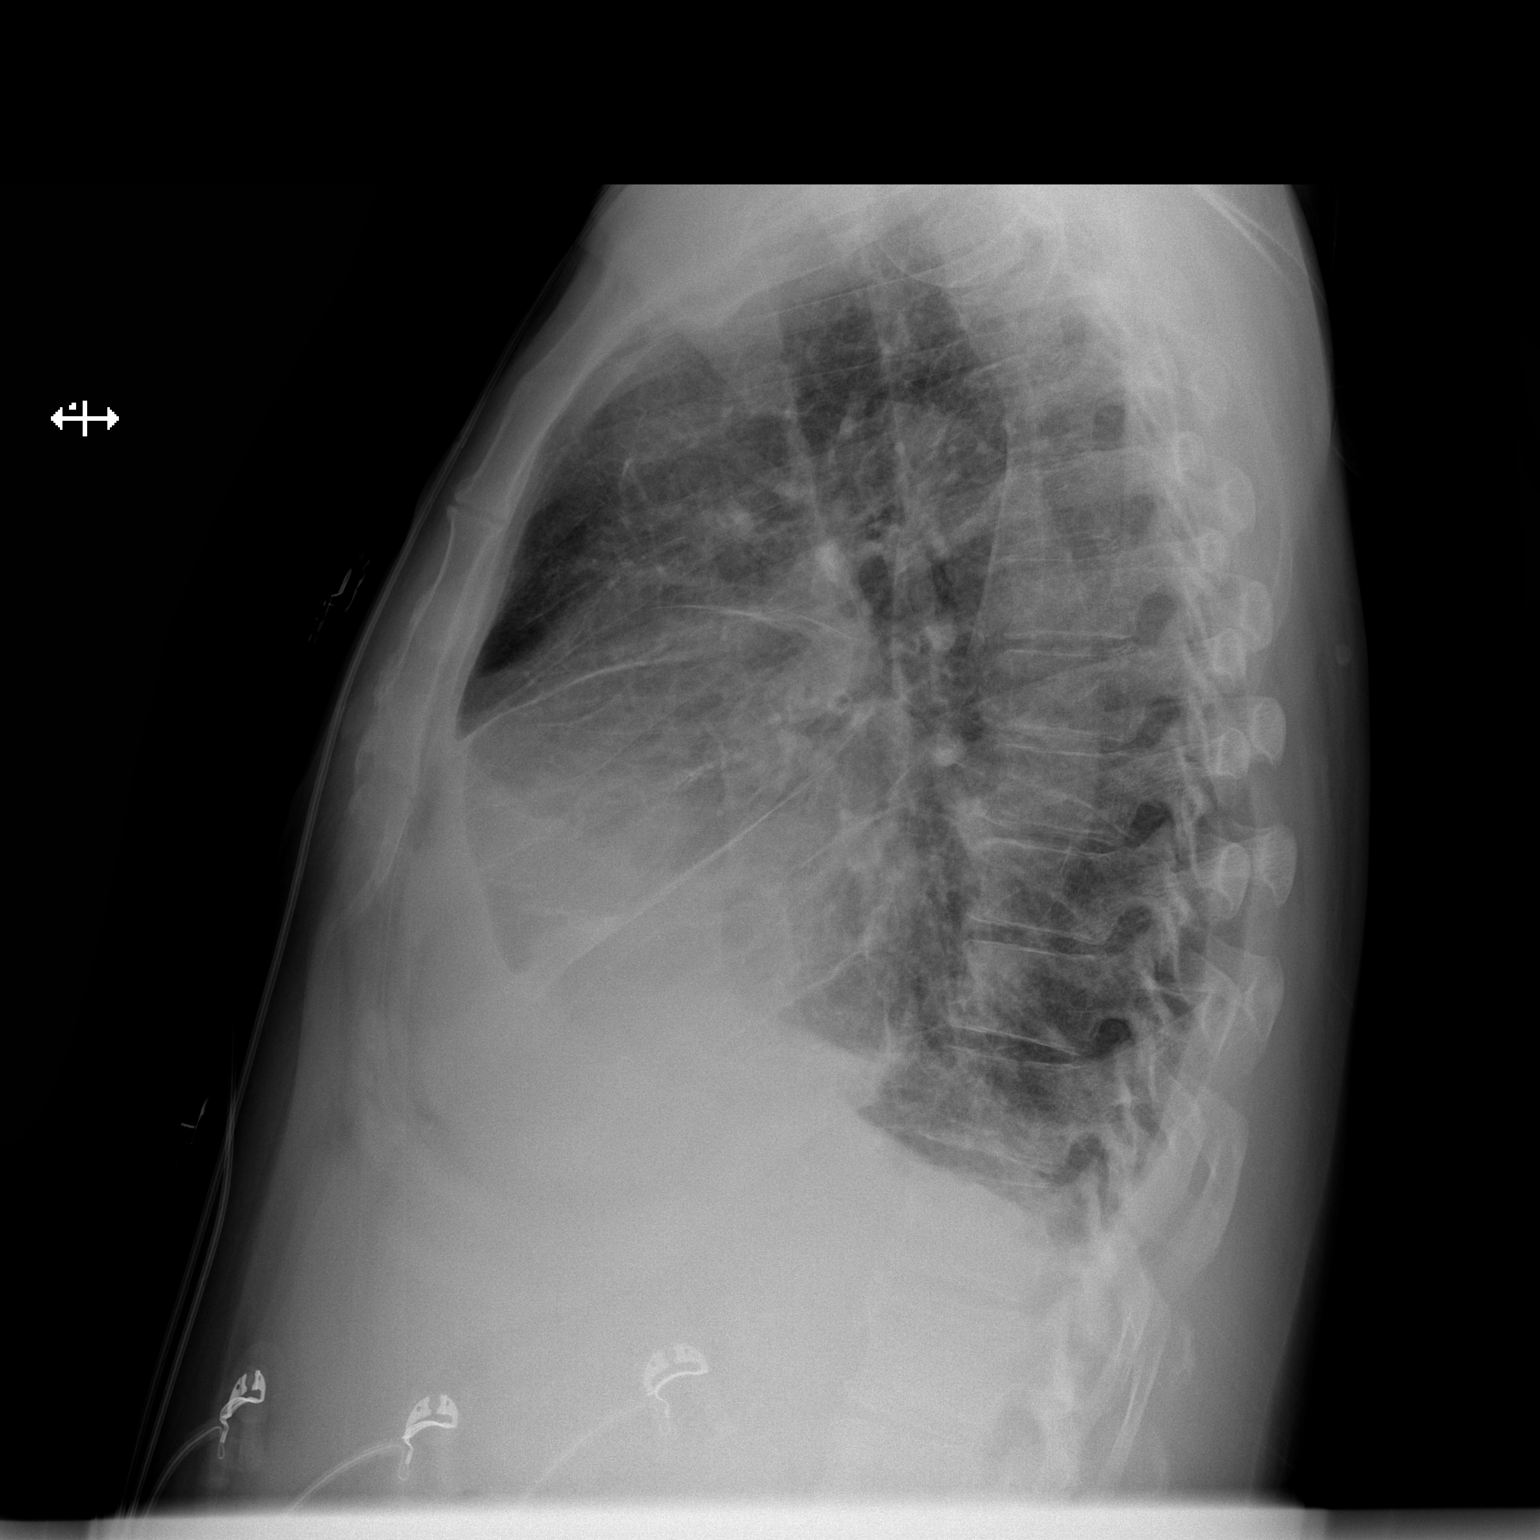

[2 of 2 positions shown; findings below may reference images not displayed]

FINDINGS: Heart is enlarged. Mild pulmonary vascular congestion has increased.
Bilateral pleural effusions are present. Bibasilar airspace disease
is noted.
IMPRESSION: 1. Cardiomegaly with pulmonary vascular congestion bilateral
effusions suggesting congestive heart failure.
2. Increased bibasilar airspace disease likely reflects atelectasis.
Infection is not excluded.

## 2020-08-03 MED ORDER — OXYCODONE-ACETAMINOPHEN 7.5-325 MG PO TABS: 1.0000 | ORAL_TABLET | Freq: Four times a day (QID) | ORAL | 0 refills | Status: DC | PRN

## 2020-08-03 MED ORDER — TORSEMIDE 20 MG PO TABS
40.0000 mg | ORAL_TABLET | Freq: Every day | ORAL | Status: DC
  Administered 2020-08-04: 40 mg via ORAL
  Filled 2020-08-03: qty 2

## 2020-08-03 MED FILL — OXYCODONE-APAP 7.5-325MG: 7.5-325 | 3 days supply | Qty: 20 | Fill #0

## 2020-08-03 NOTE — Progress Notes (Signed)
Outpatient CSW able to get pt new phone. Brought to pt at bedside and put in my number, the paramedics number, and his familys number so he would be able to contact people he needed.  CSW had spoken to pt mom earlier and she was asking about assistance for pt in paying for a hotel.  No programs available to help pt with this that CSW is aware of- spoke to multiple other coworkers and agencies who confirm no programs that can assist at this time.  Unsure of DC date at this time but pt planning to return with his friend [name] until he can afford a motel when he gets his check.  CSW will plan to follow up with pt on Monday so we can make him an appt with the housing authority so he can move forward with project based voucher program- once he completes this we are hopeful he will get placed in an apt within 1-2 months  CSW will continue to follow outpatient and assist as needed  [name], [facility] Desk#: [phone] Cell#: [phone]

## 2020-08-03 NOTE — TOC Initial Note (Signed)
Transition of Care [facility]) - Initial/Assessment Note    Patient Details  Name: Brad [name] MRN: [id number] Date of Birth: 14-May-1961  [facility]) CM/SW Contact:    Pollie Friar, RN Phone Number: 08/03/2020, 2:15 PM  Clinical Narrative:                 After last admission pt went to stay with a friend instead of his roommate and that did not work out per pt. He plans at d/c to go back to [place] (roommate) until he is able to get his check on the 25 th and then he is going to stay at the [facility].  Pt mentioned ALF but says he doesn't want a place to take all of his money. Pt states he is suppose to get into low income housing once he completes the paperwork.  Pt will need transportation home at d/c and assistance with his medications. TOC following.  Expected Discharge Plan: Home/Self Care Barriers to Discharge: Continued Medical Work up   Patient Goals and CMS Choice        Expected Discharge Plan and Services Expected Discharge Plan: Home/Self Care   Discharge Planning Services: CM Consult   Living arrangements for the past 2 months: Apartment                                      Prior Living Arrangements/Services Living arrangements for the past 2 months: Apartment Lives with:: Roommate Patient language and need for interpreter reviewed:: Yes Do you feel safe going back to the place where you live?: Yes      Need for Family Participation in Patient Care: No (Comment) Care giver support system in place?: No (comment) Current home services: DME (walker/ 3 in 1/ cane) Criminal Activity/Legal Involvement Pertinent to Current Situation/Hospitalization: No - Comment as needed  Activities of Daily Living Home Assistive Devices/Equipment: Cane (specify quad or straight) ADL Screening (condition at time of admission) Patient's cognitive ability adequate to safely complete daily activities?: Yes Is the patient deaf or have difficulty hearing?:  No Does the patient have difficulty seeing, even when wearing glasses/contacts?: No Does the patient have difficulty concentrating, remembering, or making decisions?: No Patient able to express need for assistance with ADLs?: Yes Does the patient have difficulty dressing or bathing?: No Independently performs ADLs?: Yes (appropriate for developmental age) Does the patient have difficulty walking or climbing stairs?: No Weakness of Legs: None Weakness of Arms/Hands: None  Permission Sought/Granted                  Emotional Assessment Appearance:: Appears stated age Attitude/Demeanor/Rapport: Engaged Affect (typically observed): Accepting Orientation: : Oriented to Self, Oriented to Place, Oriented to  Time, Oriented to Situation   Psych Involvement: No (comment)  Admission diagnosis:  Hyponatremia [E87.1] CHF exacerbation (HCC) [I50.9] Hypervolemia, unspecified hypervolemia type [E87.70] Heart failure, unspecified HF chronicity, unspecified heart failure type [facility]) [I50.9] Patient Active Problem List   Diagnosis Date Noted  . CHF exacerbation ([place]) 08/01/2020  . Cord compression ([place])   . Acute exacerbation of CHF (congestive heart failure) ([place]) 05/29/2020  . Chronic combined systolic and diastolic CHF (congestive heart failure) ([place]) 04/19/2020  . Alcohol abuse with intoxication ([place]) 03/23/2020  . Hyponatremia 03/23/2020  . Fall 03/23/2020  . Normocytic anemia 03/23/2020  . Leukopenia 03/23/2020  . HTN (hypertension) 03/23/2020  . HLD (hyperlipidemia) 03/23/2020  .  AKI (acute kidney injury) ([place]) 03/23/2020  . CHF (congestive heart failure) ([place]) 03/23/2020  . Alcohol abuse with intoxication ([place]) 03/05/2020  . Chronic systolic CHF (congestive heart failure) ([place]) 10/31/2019  . Acute systolic CHF (congestive heart failure) ([place]) 08/02/2019  . Diarrhea 07/29/2019  . Gastroenteritis 07/22/2019  . Hypomagnesemia 06/19/2019  . Chest pain 06/07/2019  . Elevated  troponin 05/23/2019  . Tobacco abuse 05/23/2019  . Alcohol abuse 02/21/2019  . Frequent falls 01/17/2019  . Severe mitral regurgitation   . Fall 11/01/2018  . Hyponatremia 10/31/2018  . Chronic atrial fibrillation   . Chronic hyponatremia 08/16/2018  . NSVT (nonsustained ventricular tachycardia) ([place])   . Concussion with loss of consciousness   . Scalp laceration   . Trauma   . Permanent atrial fibrillation   . Hypertensive heart disease   . Shortness of breath   . Pyogenic inflammation of bone ([place])   . Cirrhosis ([place]) 03/23/2018  . Right ankle pain 03/23/2018  . Syncope 08/07/2017  . Acute on chronic combined systolic and diastolic CHF (congestive heart failure) ([place]) 07/09/2017  . Atrial flutter ([place]) 07/09/2017  . Pre-syncope 07/08/2017  . Neuropathy 05/08/2017  . Substance induced mood disorder ([place]) 10/06/2016  . CVA (cerebral vascular accident) ([place]) 09/18/2016  . Left sided numbness   . Homelessness 08/21/2016  . S/P ORIF (open reduction internal fixation) fracture 08/01/2016  . CAD (coronary artery disease), native coronary artery 07/30/2016  . Surgery, elective   . Insomnia 07/22/2016  . NSTEMI (non-ST elevated myocardial infarction) ([place])   . Anemia 07/05/2016  . Thrombocytopenia ([place]) 07/05/2016  . Cocaine abuse ([place]) 07/02/2016  . Chest pain on breathing 07/01/2016  . Essential hypertension 07/01/2016  . DM type 2 (diabetes mellitus, type 2) ([place]) 07/01/2016  . Hypokalemia 07/01/2016  . CKD (chronic kidney disease) stage 3, GFR 30-59 ml/min 07/01/2016  . Painful diabetic neuropathy ([place]) 07/01/2016  . Acute hyponatremia 05/27/2016  . Polysubstance abuse ([place]) 05/27/2016  . Chronic hepatitis C with cirrhosis ([address]) 05/27/2016  . Depression 04/21/2012  . GERD (gastroesophageal reflux disease) 02/16/2012  . History of drug abuse ([place])   . Heroin addiction ([place]) 01/29/2012   PCP:  Charlott Rakes, MD Pharmacy:   [place], [place] [address] Phone: [phone] Fax: [phone]  [place], [place] [address] [place] [address] Phone: [phone] Fax: [place], [place] - [address] [facility]. [address] [place] [address] Phone: [phone] Fax: [phone]     Social Determinants of Health (SDOH) Interventions    Readmission Risk Interventions Readmission Risk Prevention Plan 07/23/2020 06/13/2020 06/10/2020  Transportation Screening Complete Complete Complete  PCP or Specialist Appt within 5-7 Days - - -  Home Care Screening - - -  Medication Review (RN CM) - - -  Medication Review ([place]) Referral to Pharmacy Complete Complete  PCP or Specialist appointment within 3-5 days of discharge Complete Complete (No Data)  PCP/Specialist Appt Not Complete comments - - -  HRI or Home Care Consult Complete Complete -  [place] or Home Care Consult Pt Refusal Comments - - -  SW Recovery Care/Counseling Consult Complete Complete Complete  Palliative Care Screening Not Applicable Not Applicable Not Applicable  Skilled Nursing Facility Complete Complete Not Applicable  Some recent data might be hidden

## 2020-08-03 NOTE — Progress Notes (Signed)
Progress Note    Brad [name]  ZOX:[id number] DOB: 12-31-1960  DOA: 07/31/2020 PCP: Charlott Rakes, MD    Brief Narrative:     Medical records reviewed and are as summarized below:  Brad [name] is an 59 y.o. male  with history of tobacco abuse, nonischemic cardiomyopathy, hypertension, history of drug abuse, hyperlipidemia, GERD, diabetes mellitus, CHF, atrial fibrillation, and more presents to the ED with a chief complaint of swollen legs and difficulty breathing.  Patient reports sudden onset yesterday of the symptoms.  He reports that he has had chest pressure in the middle of his chest.  He rates the pressure at 10 out of 10, and reports no radiation no nausea, constant pain.  He is adamant about requesting pain medication.  He reports shortness of breath, with no cough no fever.  Laying flat makes the shortness of breath worse, he has not been able to evaluate the shortness of breath with exertion because he has been "taking it easy" since his neck surgery last week.  Patient reports that his whole body hurts, starting from the incision at his neck going all the way down his body.   Assessment/Plan:   Active Problems:   CKD (chronic kidney disease) stage 3, GFR 30-59 ml/min   Alcohol abuse   CHF exacerbation (HCC)   Acute on chronic systolic congestive heart failure.  Last ejection fraction was noted to be 35 to 40% on echocardiogram from 03/2020.  He reports compliance with diuretics, fluid restriction and salt restriction at home. He normally takes torsemide 40 mg p.o. daily.  Will start on intravenous Lasix 60 mg twice daily through 8/13 and then resume home torsemide -Continue to monitor intake and output and daily weights -says he drinks at least 24 oz of beer/day  Chronic atrial fibrillation.  Not on anticoagulation due to heavy alcohol abuse and frequent falls.  per chart review no longer on coreg due to bradycardia.  Continue on amiodarone and digoxin.   Diabetes.  Chronically on glipizide.  Will hold for now.  Continue to monitor on sliding scale insulin.  Hyponatremia.  Patient has chronic hyponatremia with baseline sodium 127-130.  Currently in the setting of hypervolemia, will continue diuresis and monitor serum sodium.  Cervical spinal stenosis status post ACDF on 07/24/2020.  Continue pain management and follow-up with neurosurgery.  Chronic HCV with liver cirrhosis.  Stable.  Alcohol abuse -watch for withdrawal  Family Communication/Anticipated D/C date and plan/Code Status   DVT prophylaxis: heparin Code Status: Full Code.  Disposition Plan: Status is: Inpatient  Remains inpatient appropriate because:IV treatments appropriate due to intensity of illness or inability to take PO   Dispo: The patient is from: Home              Anticipated d/c is to: Home              Anticipated d/c date is: 1 day              Patient currently is not medically stable to d/c.         Medical Consultants:    None.     Subjective:   Breathing better  Objective:    Vitals:   08/03/20 0041 08/03/20 0045 08/03/20 0609 08/03/20 1234  BP: 140/74 112/70 125/70 121/74  Pulse: (!) 58 75  65  Resp: 16 16 17 16   Temp:  97.7 F (36.5 C) 97.6 F (36.4 C) 98.2 F (36.8 C)  TempSrc:  Oral Oral Oral  SpO2: 100% 100% 100% 99%  Weight:      Height:        Intake/Output Summary (Last 24 hours) at 08/03/2020 1441 Last data filed at 08/03/2020 0630 Gross per 24 hour  Intake 1880 ml  Output 1500 ml  Net 380 ml   Filed Weights   07/31/20 0351 08/02/20 0544  Weight: 90 kg 80.6 kg    Exam:   General: Appearance:    Well developed, well nourished male in no acute distress     Lungs:     Clear to auscultation bilaterally, respirations unlabored  Heart:    Normal heart rate. - no further bradycardia  MS:   All extremities are intact.   Neurologic:   Awake, alert, oriented x 3. No apparent focal neurological           defect.      Data Reviewed:   I have personally reviewed following labs and imaging studies:  Labs: Labs show the following:   Basic Metabolic Panel: Recent Labs  Lab 07/30/20 0644 07/30/20 0644 07/31/20 0400 07/31/20 0400 08/01/20 0445 08/02/20 0536 08/03/20 0618  NA 126*  --  124*  --   --  129* 130*  K 4.3   < > 4.2   < >  --  4.3 4.5  CL 93*  --  91*  --   --  95* 94*  CO2 20*  --  23  --   --  24 24  GLUCOSE 169*  --  132*  --   --  155* 162*  BUN 27*  --  27*  --   --  35* 39*  CREATININE 1.33*  --  1.42*  --  1.34* 1.67* 1.71*  CALCIUM 8.4*  --  8.3*  --   --  8.5* 8.7*  MG  --   --   --   --   --  1.7  --    < > = values in this interval not displayed.   GFR Estimated Creatinine Clearance: 48.9 mL/min (A) (by C-G formula based on SCr of 1.71 mg/dL (H)). Liver Function Tests: Recent Labs  Lab 07/30/20 0644  AST 43*  ALT 23  ALKPHOS 68  BILITOT 0.3  PROT 6.3*  ALBUMIN 2.5*   No results for input(s): LIPASE, AMYLASE in the last 168 hours. No results for input(s): AMMONIA in the last 168 hours. Coagulation profile No results for input(s): INR, PROTIME in the last 168 hours.  CBC: Recent Labs  Lab 07/30/20 0644 07/31/20 0400 08/01/20 0445 08/02/20 0536  WBC 7.8 6.9 7.4 5.1  NEUTROABS 6.3  --   --   --   HGB 10.2* 10.9* 11.3* 10.1*  HCT 32.0* 33.3* 35.3* 31.6*  MCV 90.9 90.0 90.3 89.8  PLT 244 331 323 305   Cardiac Enzymes: No results for input(s): CKTOTAL, CKMB, CKMBINDEX, TROPONINI in the last 168 hours. BNP (last 3 results) No results for input(s): PROBNP in the last 8760 hours. CBG: Recent Labs  Lab 08/02/20 1120 08/02/20 1616 08/02/20 2105 08/03/20 0608 08/03/20 1112  GLUCAP 167* 147* 209* 161* 158*   D-Dimer: No results for input(s): DDIMER in the last 72 hours. Hgb A1c: Recent Labs    08/01/20 0445  HGBA1C 7.3*   Lipid Profile: No results for input(s): CHOL, HDL, LDLCALC, TRIG, CHOLHDL, LDLDIRECT in the last 72 hours. Thyroid function  studies: No results for input(s): TSH, T4TOTAL, T3FREE, THYROIDAB in the last 72 hours.  Invalid input(s):  FREET3 Anemia work up: No results for input(s): VITAMINB12, FOLATE, FERRITIN, TIBC, IRON, RETICCTPCT in the last 72 hours. Sepsis Labs: Recent Labs  Lab 07/30/20 0644 07/31/20 0400 08/01/20 0445 08/02/20 0536  WBC 7.8 6.9 7.4 5.1    Microbiology Recent Results (from the past 240 hour(s))  SARS Coronavirus 2 by RT PCR (hospital order, performed in [facility] hospital lab) Nasopharyngeal Nasopharyngeal Swab     Status: None   Collection Time: 08/01/20  1:25 AM   Specimen: Nasopharyngeal Swab  Result Value Ref Range Status   SARS Coronavirus 2 NEGATIVE NEGATIVE Final    Comment: (NOTE) SARS-CoV-2 target nucleic acids are NOT DETECTED.  The SARS-CoV-2 RNA is generally detectable in upper and lower respiratory specimens during the acute phase of infection. The lowest concentration of SARS-CoV-2 viral copies this assay can detect is 250 copies / mL. A negative result does not preclude SARS-CoV-2 infection and should not be used as the sole basis for treatment or other patient management decisions.  A negative result may occur with improper specimen collection / handling, submission of specimen other than nasopharyngeal swab, presence of viral mutation(s) within the areas targeted by this assay, and inadequate number of viral copies (<250 copies / mL). A negative result must be combined with clinical observations, patient history, and epidemiological information.  Fact Sheet for Patients:   StrictlyIdeas.no  Fact Sheet for Healthcare Providers: BankingDealers.co.za  This test is not yet approved or  cleared by the [place] FDA and has been authorized for detection and/or diagnosis of SARS-CoV-2 by FDA under an Emergency Use Authorization (EUA).  This EUA will remain in effect (meaning this test can be used) for the  duration of the COVID-19 declaration under Section 564(b)(1) of the Act, 21 U.S.C. section 360bbb-3(b)(1), unless the authorization is terminated or revoked sooner.  Performed at [facility], [place] [address] [place], [place] [address]     Procedures and diagnostic studies:  No results found.  Medications:   . amiodarone  200 mg Oral Daily  . aspirin EC  81 mg Oral Daily  . atorvastatin  20 mg Oral q1800  . digoxin  0.125 mg Oral Daily  . furosemide  60 mg Intravenous BID  . gabapentin  600 mg Oral BID  . heparin  5,000 Units Subcutaneous Q8H  . insulin aspart  0-15 Units Subcutaneous TID WC  . insulin aspart  0-5 Units Subcutaneous QHS  . isosorbide-hydrALAZINE  1 tablet Oral TID  . magnesium oxide  400 mg Oral Daily  . sodium chloride flush  3 mL Intravenous Once  . sodium chloride flush  3 mL Intravenous Q12H  . spironolactone  12.5 mg Oral Daily  . thiamine  100 mg Oral Daily  . [START ON 08/04/2020] torsemide  40 mg Oral Daily  . vitamin B-12  1,000 mcg Oral Daily   Continuous Infusions: . sodium chloride       LOS: 2 days   Geradine Girt  [facility]   How to contact the [facility] Attending or Consulting provider [place] or covering provider during after hours [place], for this patient?  1. Check the care team in [facility] and look for a) attending/consulting TR[facility] provider listed and b) the [facility] team listed 2. Log into [email or web address] and use [place]'s universal password to access. If you do not have the password, please contact the hospital operator. 3. Locate the [facility] provider you are looking for under [facility] and page  to a number that you can be directly reached. 4. If you still have difficulty reaching the provider, please page the [facility] (Director on Call) for the Hospitalists listed on amion for assistance.  08/03/2020, 2:41 PM

## 2020-08-03 NOTE — Evaluation (Signed)
Occupational Therapy Evaluation & Discharge Patient Details Name: Brad [name] MRN: [id number] DOB: 1961-02-05 Today's Date: 08/03/2020    History of Present Illness Pt is 59 yo male with hx of nonischemic cardiomyopathy, HTN, hyperlipidemia, GERD, tobacco abuse, DM, CHF, afib, and past hx of drug abuse.  Additionally pt with admission in early August with rib fractures, R LE myoclonic jerking, hyponatremia, and required C4-6 ACDF for cord emema from recent fall. Pt now presents with LE edema and difficulty breathing and admitteed with CHF.   Clinical Impression   Pt presents with diagnoses above and minor deficits in steadiness in standing suspect due to swelling and pt's report of hx of ankle fx. Pt demonstrates ability to perform most ADLs Independently with Supervision required for ADL mobility without AD. Pt does continue to require intermittent cues to maintain cervical precautions, but noted at times to self correct when attempting to turn head during session. Pt with no apparent LOB today and suspect that pt will return to baseline functioning once B LE swelling decreases. Educated pt on techniques and positioning to assist in alleviating swelling. No OT needs indicated at this time. Recommend pt to follow up with surgeon for cervical precautions/need for brace.     Follow Up Recommendations  No OT follow up    Equipment Recommendations  None recommended by OT (reports he already has RW)    Recommendations for Other Services       Precautions / Restrictions Precautions Precautions: Cervical;Fall Precaution Comments: surgery on 8/3. Pt reports continuing to keep "neck straight" but says doctor told him he does not have to wear neck brace Restrictions Weight Bearing Restrictions: No Other Position/Activity Restrictions: Per pt, he was told he no longer had to wear cervical collar but still had precautions      Mobility Bed Mobility Overal bed mobility: Independent Bed  Mobility: Supine to Sit;Sit to Supine     Supine to sit: Independent Sit to supine: Independent   General bed mobility comments: No assist needed  Transfers Overall transfer level: Needs assistance Equipment used: None Transfers: Sit to/from Stand Sit to Stand: Supervision         General transfer comment: Supervision on standing for safety. Pt reports he can typically tell when he needs to use RW when he first stands up    Balance Overall balance assessment: Needs assistance Sitting-balance support: No upper extremity supported Sitting balance-Leahy Scale: Normal     Standing balance support: No upper extremity supported Standing balance-Leahy Scale: Good Standing balance comment: for dynamic balance requires UE support                           ADL either performed or assessed with clinical judgement   ADL Overall ADL's : Needs assistance/impaired Eating/Feeding: Independent;Sitting   Grooming: Standing;Modified independent   Upper Body Bathing: Independent;Sitting   Lower Body Bathing: Sit to/from stand;Modified independent   Upper Body Dressing : Sitting;Independent Upper Body Dressing Details (indicate cue type and reason): Independent to don/doff hospital gown with cues for cervical precautions Lower Body Dressing: Sit to/from stand;Modified independent Lower Body Dressing Details (indicate cue type and reason): Independent to adjust socks, ensure maintaining cervical precautions Toilet Transfer: Supervision/safety;Ambulation;Regular Glass blower/designer Details (indicate cue type and reason): Supervision for mobility in room Toileting- Clothing Manipulation and Hygiene: Independent;Sit to/from stand;Sitting/lateral lean       Functional mobility during ADLs: Min guard General ADL Comments: Pt with decreased awareness of cervical  precaution implementation and mild unsteadiness in standing without AD. Pt also with pain impacting participation in  daily tasks     Vision Baseline Vision/History: No visual deficits Patient Visual Report: No change from baseline Vision Assessment?: No apparent visual deficits     Perception     Praxis      Pertinent Vitals/Pain Pain Assessment: Faces Faces Pain Scale: Hurts little more Pain Location: neck and down R arm; chest; and bil feet/ankles Pain Descriptors / Indicators: Sore Pain Intervention(s): Monitored during session;Patient requesting pain meds-RN notified     Hand Dominance Right   Extremity/Trunk Assessment Upper Extremity Assessment Upper Extremity Assessment: RUE deficits/detail;LUE deficits/detail RUE Deficits / Details: Bil shoulder elevation limited to 90 due to rib and neck pain; MMT at least 3/5 but not further tested due to pain LUE Deficits / Details: Bil shoulder elevation limited to 90 due to rib and neck pain; MMT at least 3/5 but not further tested due to pain   Lower Extremity Assessment Lower Extremity Assessment: Defer to PT evaluation   Cervical / Trunk Assessment Cervical / Trunk Assessment: Other exceptions Cervical / Trunk Exceptions: cervical flexion   Communication Communication Communication: No difficulties   Cognition Arousal/Alertness: Awake/alert Behavior During Therapy: Impulsive Overall Cognitive Status: No family/caregiver present to determine baseline cognitive functioning Area of Impairment: Safety/judgement;Awareness;Problem solving                     Memory: Decreased recall of precautions   Safety/Judgement: Decreased awareness of safety;Decreased awareness of deficits Awareness: Emergent Problem Solving: Requires verbal cues;Difficulty sequencing General Comments: A&Ox4. Aware of cervical precautions but requires frequent cues to maintain.    General Comments  Pt reporting pain, perserverating on need to speak with heart doctor. Educated on exercises, activities and positioning to decrease swelling in B LE     Exercises     Shoulder Instructions      Home Living Family/patient expects to be discharged to:: Unsure Living Arrangements: Other (Comment) (roommate)                           Home Equipment: Walker - 2 wheels   Additional Comments: Pt reports having a RW that he uses occasionally. Pt reports living with a roommate who has not been paying rent so does not plan to return there.       Prior Functioning/Environment Level of Independence: Independent with assistive device(s)        Comments: Independent with ADLs, IADLs and used RW occasionally for mobility         OT Problem List:        OT Treatment/Interventions:      OT Goals(Current goals can be found in the care plan section) Acute Rehab OT Goals Patient Stated Goal: figure out why he keeps swelling  OT Frequency:     Barriers to D/C:            Co-evaluation              AM-PAC OT "6 Clicks" Daily Activity     Outcome Measure Help from another person eating meals?: None Help from another person taking care of personal grooming?: None Help from another person toileting, which includes using toliet, bedpan, or urinal?: A Little Help from another person bathing (including washing, rinsing, drying)?: None Help from another person to put on and taking off regular upper body clothing?: None Help from another person to put on  and taking off regular lower body clothing?: None 6 Click Score: 23   End of Session Equipment Utilized During Treatment: Gait belt Nurse Communication: Mobility status;Patient requests pain meds  Activity Tolerance: Patient tolerated treatment well Patient left: in bed;with call bell/phone within reach;with nursing/sitter in room                   Time: 0900-0920 OT Time Calculation (min): 20 min Charges:  OT General Charges $OT Visit: 1 Visit OT Evaluation $OT Eval Low Complexity: 1 Low  Layla Maw, OTR/L  Layla Maw 08/03/2020, 9:33 AM

## 2020-08-03 NOTE — Progress Notes (Signed)
Physical Therapy Treatment Patient Details Name: Brad [name] MRN: [id number] DOB: July 07, 1961 Today's Date: 08/03/2020    History of Present Illness Pt is 59 yo male with hx of nonischemic cardiomyopathy, HTN, hyperlipidemia, GERD, tobacco abuse, DM, CHF, afib, and past hx of drug abuse.  Additionally pt with admission in early August with rib fractures, R LE myoclonic jerking, hyponatremia, and required C4-6 ACDF for cord emema from recent fall. Pt now presents with LE edema and difficulty breathing and admitteed with CHF.    PT Comments    Pt progressing towards goals. Continues to present with BLE pain (RLE>LLE) and slight R knee instability. Mild LOB noted requiring min guard A for safety. Pt requiring cues to maintain neck precautions. Current recommendations appropriate. Will continue to follow acutely.    Follow Up Recommendations  Supervision - Intermittent     Equipment Recommendations  None recommended by PT    Recommendations for Other Services       Precautions / Restrictions Precautions Precautions: Cervical;Fall Precaution Comments: surgery was on 8/3. Required cues to maintain precautions throughout as pt had tendency to twist her neck. Verbally reviewed precautions with pt.  Restrictions Weight Bearing Restrictions: No    Mobility  Bed Mobility               General bed mobility comments: Standing in room upon entry   Transfers Overall transfer level: Needs assistance Equipment used: None Transfers: Sit to/from Stand Sit to Stand: Supervision         General transfer comment: Supervision for safety.   Ambulation/Gait Ambulation/Gait assistance: Supervision Gait Distance (Feet): 400 Feet Assistive device: None Gait Pattern/deviations: Step-through pattern;Drifts right/left Gait velocity: decreased   General Gait Details: Noted mild R knee instability with increased distance. 1 LOB noted, requiring min guard for safety. Otherwise requiring  supervision for safety.    Stairs             Wheelchair Mobility    Modified [name] (Stroke Patients Only)       Balance Overall balance assessment: Needs assistance Sitting-balance support: No upper extremity supported Sitting balance-Leahy Scale: Normal     Standing balance support: No upper extremity supported Standing balance-Leahy Scale: Fair                              Cognition Arousal/Alertness: Awake/alert Behavior During Therapy: Impulsive Overall Cognitive Status: No family/caregiver present to determine baseline cognitive functioning Area of Impairment: Safety/judgement;Awareness;Problem solving                         Safety/Judgement: Decreased awareness of safety;Decreased awareness of deficits Awareness: Emergent Problem Solving: Requires verbal cues;Difficulty sequencing General Comments: Required cues to maintain neck precautions. Pt somewhat impulsive and required safety cues.       Exercises      General Comments        Pertinent Vitals/Pain Pain Assessment: Faces Faces Pain Scale: Hurts little more Pain Location: neck and in bilateral feet Pain Descriptors / Indicators: Sore Pain Intervention(s): Limited activity within patient's tolerance;Monitored during session;Repositioned    Home Living Family/patient expects to be discharged to:: Unsure                    Prior Function            PT Goals (current goals can now be found in the care plan section) Acute Rehab PT Goals  Patient Stated Goal: to go home PT Goal Formulation: With patient Time For Goal Achievement: 08/16/20 Potential to Achieve Goals: Good Progress towards PT goals: Progressing toward goals    Frequency    Min 3X/week      PT Plan Current plan remains appropriate    Co-evaluation              AM-PAC PT "6 Clicks" Mobility   Outcome Measure  Help needed turning from your back to your side while in a flat bed  without using bedrails?: None Help needed moving from lying on your back to sitting on the side of a flat bed without using bedrails?: None Help needed moving to and from a bed to a chair (including a wheelchair)?: None Help needed standing up from a chair using your arms (e.g., wheelchair or bedside chair)?: None Help needed to walk in hospital room?: None Help needed climbing 3-5 steps with a railing? : A Little 6 Click Score: 23    End of Session   Activity Tolerance: Patient tolerated treatment well Patient left: in bed;with call bell/phone within reach (sitting EOB ) Nurse Communication: Mobility status PT Visit Diagnosis: Other abnormalities of gait and mobility (R26.89);History of falling (Z91.81)     Time: 4462-8638 PT Time Calculation (min) (ACUTE ONLY): 15 min  Charges:  $Gait Training: 8-22 mins                     Reuel Derby, PT, DPT  Acute Rehabilitation Services  Pager: [phone] Office: [phone]    Brad [name] 08/03/2020, 5:08 PM

## 2020-08-04 LAB — BASIC METABOLIC PANEL
Anion gap: 10 (ref 5–15)
BUN: 39 mg/dL — ABNORMAL HIGH (ref 6–20)
CO2: 25 mmol/L (ref 22–32)
Calcium: 8.9 mg/dL (ref 8.9–10.3)
Chloride: 96 mmol/L — ABNORMAL LOW (ref 98–111)
Creatinine, Ser: 1.81 mg/dL — ABNORMAL HIGH (ref 0.61–1.24)
GFR calc Af Amer: 46 mL/min — ABNORMAL LOW (ref >60)
GFR calc non Af Amer: 40 mL/min — ABNORMAL LOW (ref >60)
Glucose, Bld: 156 mg/dL — ABNORMAL HIGH (ref 70–99)
Potassium: 4.4 mmol/L (ref 3.5–5.1)
Sodium: 131 mmol/L — ABNORMAL LOW (ref 135–145)

## 2020-08-04 LAB — GLUCOSE, CAPILLARY
Glucose-Capillary: 154 mg/dL — ABNORMAL HIGH (ref 70–99)
Glucose-Capillary: 206 mg/dL — ABNORMAL HIGH (ref 70–99)
Glucose-Capillary: 139 mg/dL — ABNORMAL HIGH (ref 70–99)

## 2020-08-04 MED ORDER — CYANOCOBALAMIN 1000 MCG PO TABS: 1000.0000 ug | ORAL_TABLET | Freq: Every day | ORAL | Status: DC

## 2020-08-04 NOTE — Progress Notes (Signed)
Pt states he does not have keys to his apartment and his roommate is not answering the phone  Informed social work  Social work aware and managing Updated pt mother on plan of care  Pt mother attempted to secure motel but was unsuccessful  Pt requested to return to previous apartment Pt IV and telemetry removed by NT  Pt has all belongings including TOC medications, 2 bus passes, $10, AVS, shelters & food bank resources Called [employer] for discharge transportation  Pt discharged via wheelchair with NT

## 2020-08-04 NOTE — TOC Progression Note (Addendum)
Transition of Care [facility]) - Progression Note    Patient Details  Name: Brad [name] MRN: [id number] Date of Birth: 1961/10/22  Transition of Care [facility]) CM/SW [place], LCSW Phone Number: 08/04/2020, 12:24 PM  Clinical Narrative: TOC contacted by RN as patient is cleared for discharge but reporting he does not have keys to his apartment. Patient informed RN his roommate has them but cannot get ahold of them. CSW reached out to roommate and noted number is out of service. CSW spoke with mother listed as emergency contact who reports patient has been on the street the past 2-3 weeks as he was giving his roommate money for rent but instead of paying rent was filtering it elsewhere. Per mother they have been kicked out of the residence and patient has been working with his case worker Administrator, [employer]. CSW noted Sonia Baller provided patient new phone Friday and has a tentative plan to follow up with [employer] supports and patient has a plan to go to a motel when his check arrives on the 25th. CSW updated RN regarding disposition for discharge and notes no change to current plans for discharge. CSW contacted [place] at listed phone number but call failed and was unable to go through.   2:45PM: CSW received call from family regarding case worker at [facility]. CSW followed up with [employer] shelter case worker and notes if patient was vaccinated he could come in tonight, otherwise he would be able to come Monday and begin with a IT trainer for quarantine for 2-3 days. CSW informed patient who reported to CSW and RN he was vaccinated, however reported to family he is unable to be vaccinated unless his PCP signs off on it. CSW informed patient to call CSW if he is able to attain records of him being vaccinated.  CSW received follow-up call from family that they are concerned about his staples being removed. Per RN this is to be followed up outpatient and CSW noted per Neurosurgery consult this  plan has been for outpatient follow-up as of 07/30/2020. CSW acknowledges family's concerns of his staples and being infected out on the street. CSW noted family requested to speak with provider. CSW notes RN is discussing with patient and MD is discharging. CSW notes patient may either follow-up with [place] Monday or his case manager Monday for shelter support and resources. CSW asked if patient's family would be able to pay for a motel tonight and notes they are unable to. At this time available community resources are exhausted until Monday, patient is cleared for discharge, and patient has not identified any social resources. CSW will support further discharge efforts.   4:10PM: CSW spoke with patient's mother who discussed getting a motel room. CSW discussed follow-up with local food pantries and supports until social work follow-up Monday. CSW noted mother reported patient was not answering his phone. CSW spoke with patient who reported he needed a nap for being stressed. CSW facilitated phone call and noted patient agreed to call his mother. CSW pending follow-up of phone discussion.    Expected Discharge Plan: Home/Self Care Barriers to Discharge: Continued Medical Work up  Expected Discharge Plan and Services Expected Discharge Plan: Home/Self Care   Discharge Planning Services: CM Consult   Living arrangements for the past 2 months: Apartment Expected Discharge Date: 08/04/20  Social Determinants of Health (SDOH) Interventions    Readmission Risk Interventions Readmission Risk Prevention Plan 07/23/2020 06/13/2020 06/10/2020  Transportation Screening Complete Complete Complete  PCP or Specialist Appt within 5-7 Days - - -  Home Care Screening - - -  Medication Review (RN CM) - - -  Medication Review ([place]) Referral to Pharmacy Complete Complete  PCP or Specialist appointment within 3-5 days of discharge Complete Complete (No  Data)  PCP/Specialist Appt Not Complete comments - - -  HRI or Home Care Consult Complete Complete -  [place] or Home Care Consult Pt Refusal Comments - - -  SW Recovery Care/Counseling Consult Complete Complete Complete  Palliative Care Screening Not Applicable Not Applicable Not Applicable  Skilled Nursing Facility Complete Complete Not Applicable  Some recent data might be hidden

## 2020-08-04 NOTE — Discharge Summary (Addendum)
Physician Discharge Summary  Brad [name] TSV:[id number] DOB: August 03, 1961 DOA: 07/31/2020  PCP: Charlott Rakes, MD  Admit date: 07/31/2020 Discharge date: 08/04/2020  Admitted From: home Discharge disposition: home   Recommendations for Outpatient Follow-Up:   Patient to set up follow up with NS: staples to be removed around 8/16 Alcohol cessation Fluid restriction Daily weights BMP 1 week  Discharge Diagnosis:   Active Problems:   CKD (chronic kidney disease) stage 3, GFR 30-59 ml/min   Alcohol abuse   CHF exacerbation ([place])    Discharge Condition: Improved.  Diet recommendation: Low sodium, heart healthy.    Wound care: to follow with NS for staple removal (patient to set up follow up)  Code status: Full.   History of Present Illness:    Brad [name]  is a 59 y.o. male, with history of tobacco abuse, nonischemic cardiomyopathy, hypertension, history of drug abuse, hyperlipidemia, GERD, diabetes mellitus, CHF, atrial fibrillation, and more presents to the ED with a chief complaint of swollen legs and difficulty breathing.  Patient reports sudden onset yesterday of the symptoms.  He reports that he has had chest pressure in the middle of his chest.  He rates the pressure at 10 out of 10, and reports no radiation no nausea, constant pain.  He is adamant about requesting pain medication.  He reports shortness of breath, with no cough no fever.  Laying flat makes the shortness of breath worse, he has not been able to evaluate the shortness of breath with exertion because he has been "taking it easy" since his neck surgery last week.  Patient reports that his whole body hurts, starting from the incision at his neck going all the way down his body.  Patient reports he smokes 5 cigarettes a day He drinks 2 cans of alcohol every 2 to 3 days. Patient claims he is clean from other illicit substances since 2013.  In the ED patient was given 60 mg of Lasix. Admission  requested for CHF exacerbation   Hospital Course by Problem:   Acute on chronic systolic congestive heart failure. Last ejection fraction was noted to be 35 to 40% on echocardiogram from 03/2020. He reports compliance with diuretics, fluid restriction and salt restriction at home. Henormally takes torsemide 40 mg p.o. daily.  -given intravenous Lasix 60 mg twice daily through 8/13 and then resume home torsemide -diuresed well and able to lay flat on day of discharge (I/os not correct as patient would not follow direction) -says he drinks at least 24 oz of beer/day  Chronic atrial fibrillation. Not on anticoagulation due to heavy alcohol abuse and frequent falls. per chart review no longer on coreg due to bradycardia. Continue on amiodarone and digoxin.  Diabetes. -resume home meds  CKD stage IIIa -BMP 1 week  Hyponatremia. Patient has chronic hyponatremia with baseline sodium 127-130.  -encouraged patient to properly fluid restrict and avoid beer   Cervical spinal stenosis status post ACDF on 07/24/2020. Continue pain management and follow-up with neurosurgery -paged to see if staples can be removed this hospitalization but no answer  Chronic HCV with liver cirrhosis. Stable.  Alcohol abuse -no sign of withdrawal    Many social issues: was given phone by Education officer, museum.  Also told social worker x 2 days that he was to return home with roommate mike.  On day of discharge patient informed social worker that they had been evicted 3 weeks prior.  Mother to get patient hotel room until his check.  Medical Consultants:      Discharge Exam:   Vitals:   08/04/20 0436 08/04/20 0843  BP: 133/84 118/72  Pulse: 62 61  Resp: 16   Temp: 98.5 F (36.9 C)   SpO2: 99%    Vitals:   08/04/20 0041 08/04/20 0436 08/04/20 0755 08/04/20 0843  BP: 122/83 133/84  118/72  Pulse: 70 62  61  Resp: 16 16    Temp: 97.6 F (36.4 C) 98.5 F (36.9 C)    TempSrc: Oral Oral      SpO2: 98% 99%    Weight:   80.8 kg   Height:        General exam: Appears calm and comfortable.   The results of significant diagnostics from this hospitalization (including imaging, microbiology, ancillary and laboratory) are listed below for reference.     Procedures and Diagnostic Studies:   DG Chest 2 View  Result Date: 07/31/2020 CLINICAL DATA:  Chest pain. Patient was seen in the emergency department yesterday for neck pain and release. EXAM: CHEST - 2 VIEW COMPARISON:  Chest x-ray 06/30/2020 FINDINGS: Heart is enlarged. Mild pulmonary vascular congestion is present. Mild bibasilar opacities are present bilaterally without significant consolidation otherwise. Surgical clips are present in the right neck. IMPRESSION: 1. Cardiomegaly and mild pulmonary vascular congestion. 2. Mild bibasilar airspace disease likely reflects atelectasis. Electronically Signed   By: San Morelle M.D.   On: 07/31/2020 04:41   ECHOCARDIOGRAM COMPLETE  Result Date: 08/01/2020    ECHOCARDIOGRAM REPORT   Patient Name:   Brad [name] Date of Exam: 08/01/2020 Medical Rec #:  [id number]       Height:       71.0 in Accession #:    [id number]      Weight:       198.4 lb Date of Birth:  [birth year]      BSA:          2.101 m Patient Age:    85 years        BP:           130/100 mmHg Patient Gender: M               HR:           70 bpm. Exam Location:  Inpatient Procedure: 2D Echo, Cardiac Doppler and Color Doppler Indications:    CHF  History:        Patient has prior history of Echocardiogram examinations, most                 recent 04/03/2020. CHF and Cardiomyopathy, Arrythmias:Atrial                 Fibrillation, Signs/Symptoms:Shortness of Breath; Risk                 Factors:Current Smoker, Hypertension, Dyslipidemia and Diabetes.                 Cocaine abuse.  Sonographer:    Dustin Flock Referring Phys: [phone] ASIA B [place]  1. Left ventricular ejection fraction, by estimation, is  30 to 35%. The left ventricle has moderately decreased function. The left ventricle demonstrates global hypokinesis. The left ventricular internal cavity size was mildly dilated. There is mild left ventricular hypertrophy. Left ventricular diastolic parameters are indeterminate.  2. Right ventricular systolic function is mildly reduced. The right ventricular size is normal. There is moderately elevated pulmonary artery systolic pressure. The estimated right ventricular systolic pressure is 30.0 mmHg.  3. Left atrial size was moderately dilated.  4. Right atrial size was moderately dilated.  5. The mitral valve is normal in structure. Moderate mitral valve regurgitation, suspect functional. No evidence of mitral stenosis.  6. The aortic valve is tricuspid. Aortic valve regurgitation is mild. No aortic stenosis is present.  7. The inferior vena cava is dilated in size with <50% respiratory variability, suggesting right atrial pressure of 15 mmHg.  8. The patient was in atrial fibrillation. FINDINGS  Left Ventricle: Left ventricular ejection fraction, by estimation, is 30 to 35%. The left ventricle has moderately decreased function. The left ventricle demonstrates global hypokinesis. The left ventricular internal cavity size was mildly dilated. There is mild left ventricular hypertrophy. Left ventricular diastolic parameters are indeterminate. Right Ventricle: The right ventricular size is normal. No increase in right ventricular wall thickness. Right ventricular systolic function is mildly reduced. There is moderately elevated pulmonary artery systolic pressure. The tricuspid regurgitant velocity is 3.14 m/s, and with an assumed right atrial pressure of 15 mmHg, the estimated right ventricular systolic pressure is 72.5 mmHg. Left Atrium: Left atrial size was moderately dilated. Right Atrium: Right atrial size was moderately dilated. Pericardium: Trivial pericardial effusion is present. Mitral Valve: The mitral valve  is normal in structure. Mild mitral annular calcification. Moderate mitral valve regurgitation. No evidence of mitral valve stenosis. Tricuspid Valve: The tricuspid valve is normal in structure. Tricuspid valve regurgitation is mild. Aortic Valve: The aortic valve is tricuspid. Aortic valve regurgitation is mild. Aortic regurgitation PHT measures 923 msec. No aortic stenosis is present. Pulmonic Valve: The pulmonic valve was normal in structure. Pulmonic valve regurgitation is trivial. Aorta: The aortic root is normal in size and structure. Venous: The inferior vena cava is dilated in size with less than 50% respiratory variability, suggesting right atrial pressure of 15 mmHg. IAS/Shunts: No atrial level shunt detected by color flow Doppler.  LEFT VENTRICLE PLAX 2D LVIDd:         5.90 cm  Diastology LVIDs:         5.10 cm  LV e' lateral:   8.35 cm/s LV PW:         1.30 cm  LV E/e' lateral: 13.9 LV IVS:        1.30 cm  LV e' medial:    6.89 cm/s LVOT diam:     2.40 cm  LV E/e' medial:  16.8 LV SV:         82 LV SV Index:   39 LVOT Area:     4.52 cm  RIGHT VENTRICLE RV Basal diam:  3.90 cm RV S prime:     8.67 cm/s TAPSE (M-mode): 2.8 cm LEFT ATRIUM            Index       RIGHT ATRIUM           Index LA diam:      5.20 cm  2.47 cm/m  RA Area:     30.20 cm LA Vol (A2C): 75.3 ml  35.83 ml/m RA Volume:   127.00 ml 60.43 ml/m LA Vol (A4C): 120.0 ml 57.10 ml/m  AORTIC VALVE LVOT Vmax:   106.00 cm/s LVOT Vmean:  52.300 cm/s LVOT VTI:    0.182 m AI PHT:      923 msec  AORTA Ao Root diam: 3.60 cm MITRAL VALVE                TRICUSPID VALVE MV Area (PHT): 3.99 cm  TR Peak grad:   39.4 mmHg MV Decel Time: 190 msec     TR Vmax:        314.00 cm/s MV E velocity: 116.00 cm/s MV A velocity: 51.40 cm/s   SHUNTS MV E/A ratio:  2.26         Systemic VTI:  0.18 m                             Systemic Diam: 2.40 cm Loralie Champagne MD Electronically signed by Loralie Champagne MD Signature Date/Time: 08/01/2020/6:43:29 PM    Final       Labs:   Basic Metabolic Panel: Recent Labs  Lab 07/30/20 1324 07/30/20 4010 07/31/20 0400 07/31/20 0400 08/01/20 0445 08/02/20 0536 08/02/20 0536 08/03/20 0618 08/04/20 0511  NA 126*  --  124*  --   --  129*  --  130* 131*  K 4.3   < > 4.2   < >  --  4.3   < > 4.5 4.4  CL 93*  --  91*  --   --  95*  --  94* 96*  CO2 20*  --  23  --   --  24  --  24 25  GLUCOSE 169*  --  132*  --   --  155*  --  162* 156*  BUN 27*  --  27*  --   --  35*  --  39* 39*  CREATININE 1.33*   < > 1.42*  --  1.34* 1.67*  --  1.71* 1.81*  CALCIUM 8.4*  --  8.3*  --   --  8.5*  --  8.7* 8.9  MG  --   --   --   --   --  1.7  --   --   --    < > = values in this interval not displayed.   GFR Estimated Creatinine Clearance: 46.2 mL/min (A) (by C-G formula based on SCr of 1.81 mg/dL (H)). Liver Function Tests: Recent Labs  Lab 07/30/20 0644  AST 43*  ALT 23  ALKPHOS 68  BILITOT 0.3  PROT 6.3*  ALBUMIN 2.5*   No results for input(s): LIPASE, AMYLASE in the last 168 hours. No results for input(s): AMMONIA in the last 168 hours. Coagulation profile No results for input(s): INR, PROTIME in the last 168 hours.  CBC: Recent Labs  Lab 07/30/20 0644 07/31/20 0400 08/01/20 0445 08/02/20 0536  WBC 7.8 6.9 7.4 5.1  NEUTROABS 6.3  --   --   --   HGB 10.2* 10.9* 11.3* 10.1*  HCT 32.0* 33.3* 35.3* 31.6*  MCV 90.9 90.0 90.3 89.8  PLT 244 331 323 305   Cardiac Enzymes: No results for input(s): CKTOTAL, CKMB, CKMBINDEX, TROPONINI in the last 168 hours. BNP: Invalid input(s): POCBNP CBG: Recent Labs  Lab 08/03/20 0608 08/03/20 1112 08/03/20 1610 08/03/20 2100 08/04/20 0607  GLUCAP 161* 158* 113* 171* 154*   D-Dimer No results for input(s): DDIMER in the last 72 hours. Hgb A1c No results for input(s): HGBA1C in the last 72 hours. Lipid Profile No results for input(s): CHOL, HDL, LDLCALC, TRIG, CHOLHDL, LDLDIRECT in the last 72 hours. Thyroid function studies No results for  input(s): TSH, T4TOTAL, T3FREE, THYROIDAB in the last 72 hours.  Invalid input(s): FREET3 Anemia work up No results for input(s): VITAMINB12, FOLATE, FERRITIN, TIBC, IRON, RETICCTPCT in the last 72 hours. Microbiology Recent Results (from the  past 240 hour(s))  SARS Coronavirus 2 by RT PCR (hospital order, performed in [facility] hospital lab) Nasopharyngeal Nasopharyngeal Swab     Status: None   Collection Time: 08/01/20  1:25 AM   Specimen: Nasopharyngeal Swab  Result Value Ref Range Status   SARS Coronavirus 2 NEGATIVE NEGATIVE Final    Comment: (NOTE) SARS-CoV-2 target nucleic acids are NOT DETECTED.  The SARS-CoV-2 RNA is generally detectable in upper and lower respiratory specimens during the acute phase of infection. The lowest concentration of SARS-CoV-2 viral copies this assay can detect is 250 copies / mL. A negative result does not preclude SARS-CoV-2 infection and should not be used as the sole basis for treatment or other patient management decisions.  A negative result may occur with improper specimen collection / handling, submission of specimen other than nasopharyngeal swab, presence of viral mutation(s) within the areas targeted by this assay, and inadequate number of viral copies (<250 copies / mL). A negative result must be combined with clinical observations, patient history, and epidemiological information.  Fact Sheet for Patients:   StrictlyIdeas.no  Fact Sheet for Healthcare Providers: BankingDealers.co.za  This test is not yet approved or  cleared by the [place] FDA and has been authorized for detection and/or diagnosis of SARS-CoV-2 by FDA under an Emergency Use Authorization (EUA).  This EUA will remain in effect (meaning this test can be used) for the duration of the COVID-19 declaration under Section 564(b)(1) of the Act, 21 U.S.C. section 360bbb-3(b)(1), unless the authorization is terminated  or revoked sooner.  Performed at [facility], [place] [address], [place], [place] [address]      Discharge Instructions:   Discharge Instructions    (HEART FAILURE PATIENTS) Call MD:  Anytime you have any of the following symptoms: 1) 3 pound weight gain in 24 hours or 5 pounds in 1 week 2) shortness of breath, with or without a dry hacking cough 3) swelling in the hands, feet or stomach 4) if you have to sleep on extra pillows at night in order to breathe.   Complete by: As directed    Diet - low sodium heart healthy   Complete by: As directed    Discharge instructions   Complete by: As directed    Be sure to follow up with NS for removal of staples next week   Heart Failure patients record your daily weight using the same scale at the same time of day   Complete by: As directed    Increase activity slowly   Complete by: As directed      Allergies as of 08/04/2020      Reactions   Angiotensin Receptor Blockers Anaphylaxis, Other (See Comments)   (Angioedema also with Lisinopril, therefore ARB's are contraindicated)   Lisinopril Anaphylaxis, Swelling   Throat swelling   Pamelor [nortriptyline Hcl] Anaphylaxis, Swelling   Throat swells      Medication List    STOP taking these medications   blood glucose meter kit and supplies Kit   glipiZIDE 5 MG tablet Commonly known as: GLUCOTROL   thiamine 100 MG tablet     TAKE these medications   amiodarone 200 MG tablet Commonly known as: PACERONE Take 1 tablet (200 mg total) by mouth daily.   aspirin EC 81 MG tablet Take 1 tablet (81 mg total) by mouth daily. Swallow whole.   atorvastatin 20 MG tablet Commonly known as: LIPITOR Take 1 tablet (20 mg total) by mouth daily at 6 PM.  cyanocobalamin 1000 MCG tablet Take 1 tablet (1,000 mcg total) by mouth daily. Start taking on: August 05, 2020   digoxin 0.125 MG tablet Commonly known as: LANOXIN Take 1 tablet (0.125 mg total) by mouth daily.   gabapentin 300 MG  capsule Commonly known as: NEURONTIN Take 2 capsules (600 mg total) by mouth 2 (two) times daily.   isosorbide-hydrALAZINE 20-37.5 MG tablet Commonly known as: BIDIL Take 1 tablet by mouth 3 (three) times daily.   oxyCODONE-acetaminophen 7.5-325 MG tablet Commonly known as: PERCOCET Take 1-2 tablets by mouth every 6 (six) hours as needed for moderate pain. What changed:   how much to take  when to take this   polyethylene glycol 17 g packet Commonly known as: MIRALAX / GLYCOLAX Take 17 g by mouth daily as needed for moderate constipation. What changed: additional instructions   spironolactone 25 MG tablet Commonly known as: ALDACTONE Take 0.5 tablets (12.5 mg total) by mouth daily.   torsemide 20 MG tablet Commonly known as: DEMADEX Take 2 tablets (40 mg total) by mouth daily.       Follow-up Information    Charlott Rakes, MD Follow up in 1 week(s).   Specialty: Family Medicine Contact information: [place] [address] [phone]        [facility] Follow up.   Contact information: [place] [address] [phone]       [place], Pieter Partridge C, DO Follow up.   Why: for staple removal and follow up post surgery Contact information: [address] [place] [address] [phone]                Time coordinating discharge: 35 min  Signed:  Geradine Girt DO  [facility] 08/04/2020, 10:58 AM

## 2020-08-04 NOTE — Discharge Instructions (Signed)
[facility] is able to offer you shelter  IF: You are able to provide documentation of your COVID-19 vaccine, call [phone]. Documentation must be clear and taken with you. Transportation assistance can be provided  OR: If unable to attain documentation or provide record of vaccine, they can place you in a two day quarantine beginning Monday in a motel room and will accept you to their program if you meet criteria.  [facility] [address] [place], [place] [address] [phone]

## 2020-08-06 ENCOUNTER — Other Ambulatory Visit: Payer: Self-pay

## 2020-08-06 ENCOUNTER — Telehealth: Payer: Self-pay

## 2020-08-06 ENCOUNTER — Other Ambulatory Visit: Payer: Self-pay | Admitting: Family Medicine

## 2020-08-06 ENCOUNTER — Telehealth (HOSPITAL_COMMUNITY): Payer: Self-pay | Admitting: Licensed Clinical Social Worker

## 2020-08-06 ENCOUNTER — Encounter: Payer: Self-pay | Admitting: Family Medicine

## 2020-08-06 ENCOUNTER — Ambulatory Visit: Payer: Medicaid Other | Attending: Family Medicine | Admitting: Family Medicine

## 2020-08-06 ENCOUNTER — Other Ambulatory Visit (HOSPITAL_COMMUNITY): Payer: Self-pay

## 2020-08-06 VITALS — BP 125/66 | HR 76 | Ht 71.0 in | Wt 178.0 lb

## 2020-08-06 DIAGNOSIS — I482 Chronic atrial fibrillation, unspecified: Secondary | ICD-10-CM | POA: Diagnosis not present

## 2020-08-06 DIAGNOSIS — I5042 Chronic combined systolic (congestive) and diastolic (congestive) heart failure: Secondary | ICD-10-CM | POA: Insufficient documentation

## 2020-08-06 DIAGNOSIS — Z598 Other problems related to housing and economic circumstances: Secondary | ICD-10-CM | POA: Diagnosis not present

## 2020-08-06 DIAGNOSIS — E1140 Type 2 diabetes mellitus with diabetic neuropathy, unspecified: Secondary | ICD-10-CM | POA: Diagnosis not present

## 2020-08-06 DIAGNOSIS — Z888 Allergy status to other drugs, medicaments and biological substances status: Secondary | ICD-10-CM | POA: Insufficient documentation

## 2020-08-06 DIAGNOSIS — Z9119 Patient's noncompliance with other medical treatment and regimen: Secondary | ICD-10-CM | POA: Diagnosis not present

## 2020-08-06 DIAGNOSIS — Z833 Family history of diabetes mellitus: Secondary | ICD-10-CM | POA: Insufficient documentation

## 2020-08-06 DIAGNOSIS — I428 Other cardiomyopathies: Secondary | ICD-10-CM | POA: Insufficient documentation

## 2020-08-06 DIAGNOSIS — Z79899 Other long term (current) drug therapy: Secondary | ICD-10-CM | POA: Insufficient documentation

## 2020-08-06 DIAGNOSIS — Z7982 Long term (current) use of aspirin: Secondary | ICD-10-CM | POA: Insufficient documentation

## 2020-08-06 DIAGNOSIS — K219 Gastro-esophageal reflux disease without esophagitis: Secondary | ICD-10-CM | POA: Insufficient documentation

## 2020-08-06 DIAGNOSIS — R296 Repeated falls: Secondary | ICD-10-CM | POA: Insufficient documentation

## 2020-08-06 DIAGNOSIS — I110 Hypertensive heart disease with heart failure: Secondary | ICD-10-CM

## 2020-08-06 DIAGNOSIS — M4802 Spinal stenosis, cervical region: Secondary | ICD-10-CM | POA: Diagnosis not present

## 2020-08-06 DIAGNOSIS — E7800 Pure hypercholesterolemia, unspecified: Secondary | ICD-10-CM | POA: Insufficient documentation

## 2020-08-06 DIAGNOSIS — Z8249 Family history of ischemic heart disease and other diseases of the circulatory system: Secondary | ICD-10-CM | POA: Insufficient documentation

## 2020-08-06 DIAGNOSIS — Z794 Long term (current) use of insulin: Secondary | ICD-10-CM | POA: Insufficient documentation

## 2020-08-06 DIAGNOSIS — N1831 Chronic kidney disease, stage 3a: Secondary | ICD-10-CM | POA: Insufficient documentation

## 2020-08-06 DIAGNOSIS — E1121 Type 2 diabetes mellitus with diabetic nephropathy: Secondary | ICD-10-CM

## 2020-08-06 DIAGNOSIS — M1990 Unspecified osteoarthritis, unspecified site: Secondary | ICD-10-CM | POA: Diagnosis not present

## 2020-08-06 DIAGNOSIS — E1159 Type 2 diabetes mellitus with other circulatory complications: Secondary | ICD-10-CM | POA: Diagnosis present

## 2020-08-06 DIAGNOSIS — E871 Hypo-osmolality and hyponatremia: Secondary | ICD-10-CM | POA: Diagnosis not present

## 2020-08-06 DIAGNOSIS — Z9861 Coronary angioplasty status: Secondary | ICD-10-CM | POA: Insufficient documentation

## 2020-08-06 DIAGNOSIS — Z981 Arthrodesis status: Secondary | ICD-10-CM | POA: Insufficient documentation

## 2020-08-06 DIAGNOSIS — E1122 Type 2 diabetes mellitus with diabetic chronic kidney disease: Secondary | ICD-10-CM | POA: Insufficient documentation

## 2020-08-06 DIAGNOSIS — K7460 Unspecified cirrhosis of liver: Secondary | ICD-10-CM | POA: Insufficient documentation

## 2020-08-06 DIAGNOSIS — I130 Hypertensive heart and chronic kidney disease with heart failure and stage 1 through stage 4 chronic kidney disease, or unspecified chronic kidney disease: Secondary | ICD-10-CM | POA: Insufficient documentation

## 2020-08-06 LAB — GLUCOSE, POCT (MANUAL RESULT ENTRY): POC Glucose: 173 mg/dl — AB (ref 70–99)

## 2020-08-06 MED ORDER — ATORVASTATIN CALCIUM 20 MG PO TABS: 20.0000 mg | ORAL_TABLET | Freq: Every day | ORAL | 3 refills | Status: DC

## 2020-08-06 MED ORDER — ISOSORB DINITRATE-HYDRALAZINE 20-37.5 MG PO TABS: 1.0000 | ORAL_TABLET | Freq: Three times a day (TID) | ORAL | 3 refills | Status: DC

## 2020-08-06 MED ORDER — SPIRONOLACTONE 25 MG PO TABS: 12.5000 mg | ORAL_TABLET | Freq: Every day | ORAL | 3 refills | Status: DC

## 2020-08-06 MED ORDER — GABAPENTIN 300 MG PO CAPS: 600.0000 mg | ORAL_CAPSULE | Freq: Two times a day (BID) | ORAL | 3 refills | Status: DC

## 2020-08-06 MED ORDER — DAPAGLIFLOZIN PROPANEDIOL 5 MG PO TABS: 5.0000 mg | ORAL_TABLET | Freq: Every day | ORAL | 3 refills | Status: DC

## 2020-08-06 NOTE — Telephone Encounter (Signed)
Pt has called back and wants [name] to call him at [phone]
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
## 2020-08-06 NOTE — Telephone Encounter (Signed)
At request of Dr Ellis Savage placed to [place] to check on order for percocet.  The patient reports that he was never given this medication when he was discharged from the hospital.   This CM spoke to [facility] and then Tracy/ [facility]. they have record that the percocet prescription  was filled for him.  They both looked into this concern and were able to confirm with the nurse who discharged the patient that he was given the percocet. It was placed in the white bag that he was given along with a blanket.    Attempted to contact the patient to inform him of above. Message left with call back requested to this CM.

## 2020-08-06 NOTE — Progress Notes (Signed)
Patient wants to get staples removed from her neck.

## 2020-08-06 NOTE — Telephone Encounter (Signed)
CSW had provided community paramedic with [employer] phone number and pt case worker (Ms. Ivor Costa) so he could call to make appt for project based voucher interview.  Pt called and reports appt is Thursday at 11am- will need CSW or paramedic to accompany to appt to help with completing application.  CSW will continue to follow and assist as needed  [name], [facility] Desk#: [phone] Cell#: [phone]
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
## 2020-08-07 ENCOUNTER — Telehealth: Payer: Self-pay

## 2020-08-07 NOTE — Telephone Encounter (Signed)
Call returned to patient # [phone], message left with call back requested to this CM

## 2020-08-07 NOTE — Telephone Encounter (Signed)
Forwarded to [name] as pt requested.

## 2020-08-07 NOTE — Telephone Encounter (Signed)
PA FOR [facility] WAS APPROVED THRU 08/07/21

## 2020-08-08 ENCOUNTER — Telehealth: Payer: Self-pay | Admitting: Family Medicine

## 2020-08-08 ENCOUNTER — Telehealth (HOSPITAL_COMMUNITY): Payer: Self-pay | Admitting: Licensed Clinical Social Worker

## 2020-08-08 NOTE — Telephone Encounter (Signed)
Copied from [place] [id number]. Topic: General - Other >> Aug 08, 2020  4:58 PM Sheran Luz wrote: Patient requesting to speak with [name]. Patient would not disclose additional information.

## 2020-08-08 NOTE — Telephone Encounter (Signed)
CSW called pt to confirm details of housing authority meeting tomorrow.  CSW will be accompanying pt to appt to assist with filling out paperwork as pt reports issues writing.   Pt reports appt is at 11am and takes place at [facility]  CSW will continue to follow and assist as needed  [name], [facility] Desk#: [phone] Cell#: [phone]

## 2020-08-09 ENCOUNTER — Telehealth (HOSPITAL_COMMUNITY): Payer: Self-pay | Admitting: Licensed Clinical Social Worker

## 2020-08-09 ENCOUNTER — Telehealth: Payer: Self-pay

## 2020-08-09 NOTE — Telephone Encounter (Signed)
CSW met with pt at [employer] office and assisted in completing paperwork for [employer] program.  Case worker, Ms. Batten, reports that we will likely hear within 1-2 days whether or not pt is found to officially be approved for the program.  If he is approved he will be placed on a shorter waitlist and would be informed when an apartment becomes available to him.  They anticipate he should receive this notice within 1-2 months.  After he is informed there is an apartment they state it is likely a 30 day turn over until he can move in.  CSW has been placed as a contact person and will be informed if he is approved and when an apartment becomes available  Pt had also requested help with food as he doesn't get food stamps for a few more days and is out.  CSW provided [name] card and referral to [name]  Will continue to follow and assist as needed  Brad [name], [facility] Desk#: [phone] Cell#: [phone]

## 2020-08-09 NOTE — Telephone Encounter (Signed)
Pt returned phone call, please advise. No answer at office  [phone]

## 2020-08-09 NOTE — Telephone Encounter (Signed)
Call placed to patient.  He provided an update on the status of his housing application.  He is pleased that it may only be a 1-2 month wait for an apartment if he is approved.    He also reported that the surgeon removed the staples in his neck yesterday and provided him with a prescription for percocet that he had filled.  This CM explained that the [facility] [facility] checked with the nurse who discharged him and the nurse said that he did receive the percocet and she put it in the white bag with a blanket. He said he never found it but was not worried because he got a new prescription yesterday.

## 2020-08-12 IMAGING — DX CHEST - 2 VIEW
2 series · 2 of 2 positions shown · non-contrast
Comparison: August 02, 2019

CLINICAL DATA: Chest pain

EXAM:
CHEST - 2 VIEW

[chest lat]
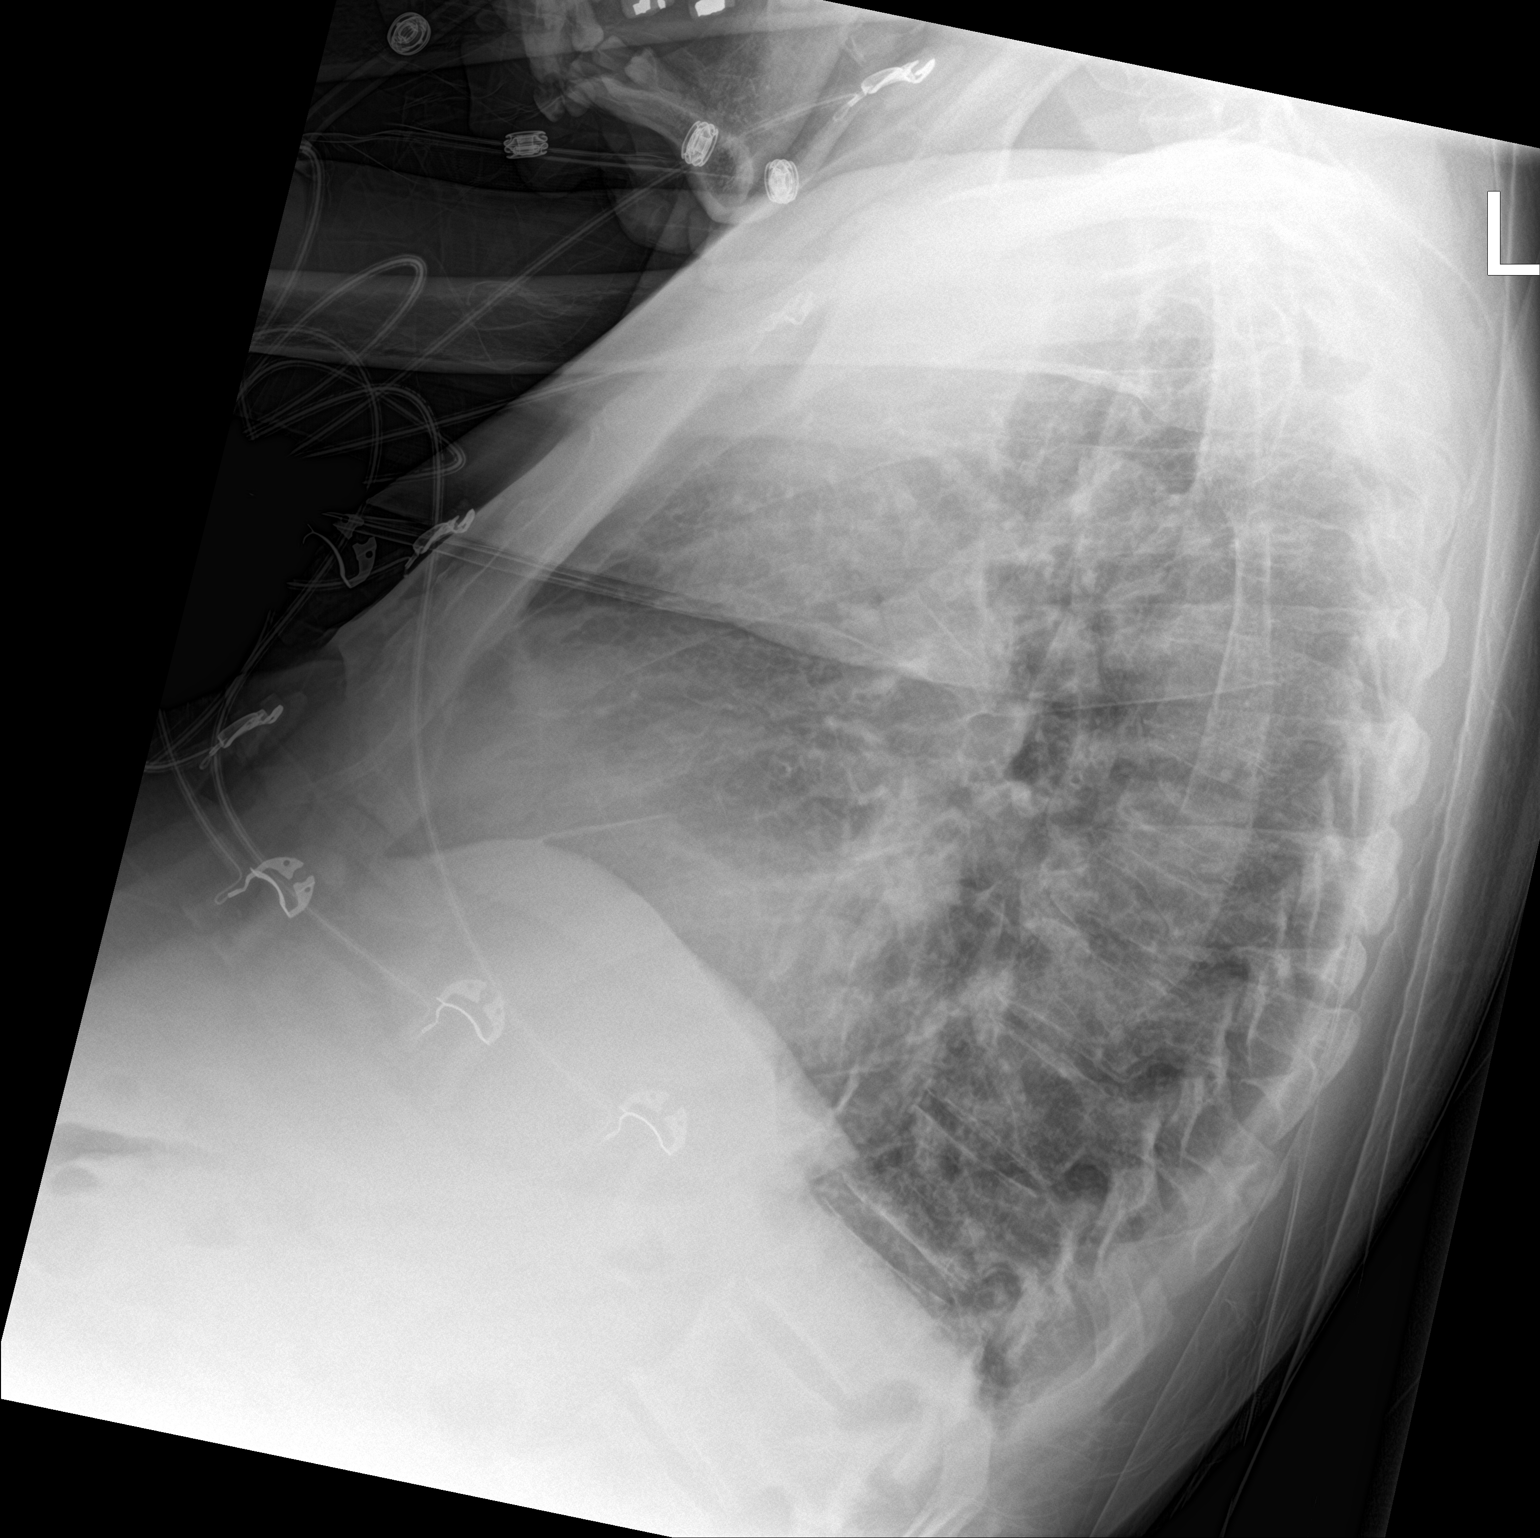

[chest ap]
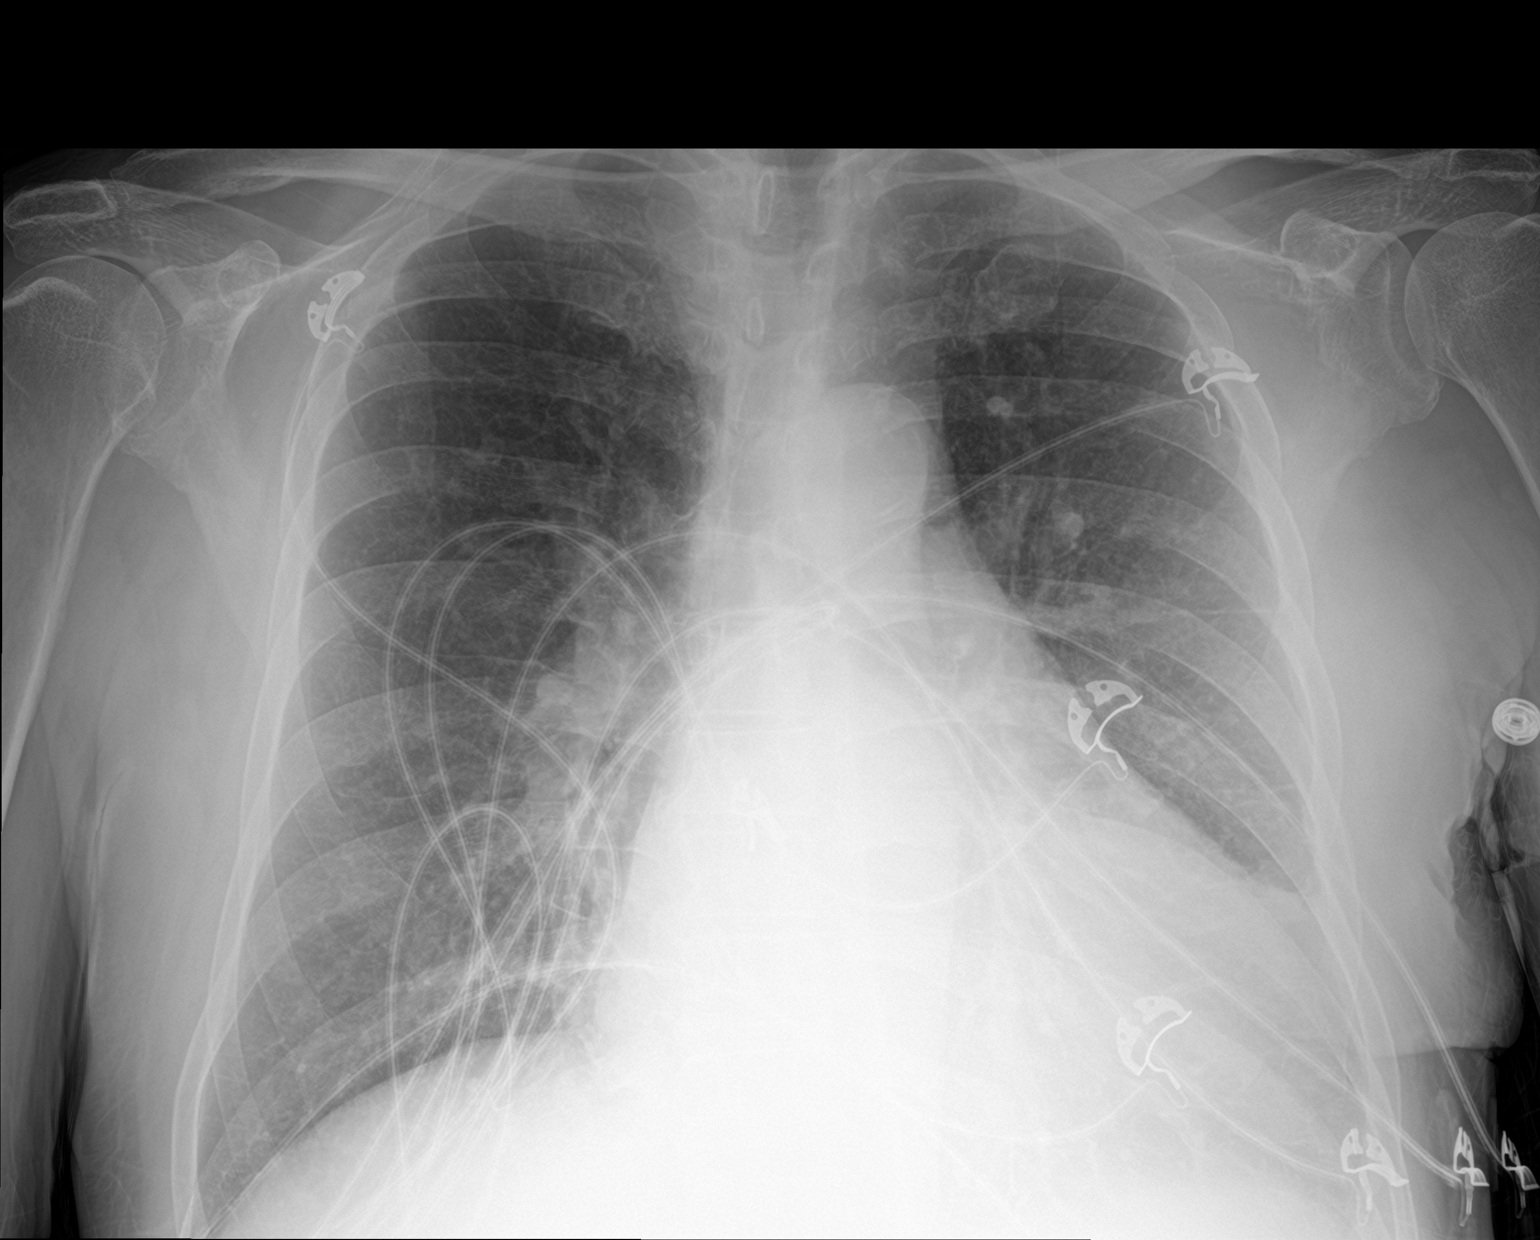

[2 of 2 positions shown; findings below may reference images not displayed]

FINDINGS: There is atelectatic change in the left base. There is no edema or
consolidation. There is cardiomegaly with pulmonary vascularity
normal. No adenopathy. No bone lesions.
IMPRESSION: Atelectasis left base. No edema or consolidation. Stable
cardiomegaly. Pulmonary vascularity within normal limits. No
adenopathy.

## 2020-08-13 ENCOUNTER — Other Ambulatory Visit (HOSPITAL_COMMUNITY): Payer: Self-pay

## 2020-08-13 ENCOUNTER — Telehealth: Payer: Self-pay

## 2020-08-13 NOTE — Progress Notes (Signed)
Paramedicine Encounter    Patient ID: Brad [name], male    DOB: 29-Aug-1961, 59 y.o.   MRN: [id number]   Patient Care Team: Charlott Rakes, MD as PCP - General (Family Medicine) Debara Pickett Nadean Corwin, MD as PCP - Cardiology (Cardiology) Charlott Rakes, MD (Family Medicine) Charlott Rakes, MD (Family Medicine)  Patient Active Problem List   Diagnosis Date Noted  . CHF exacerbation ([place]) 08/01/2020  . Cord compression ([place])   . Acute exacerbation of CHF (congestive heart failure) ([place]) 05/29/2020  . Chronic combined systolic and diastolic CHF (congestive heart failure) ([place]) 04/19/2020  . Alcohol abuse with intoxication ([place]) 03/23/2020  . Hyponatremia 03/23/2020  . Fall 03/23/2020  . Normocytic anemia 03/23/2020  . Leukopenia 03/23/2020  . HTN (hypertension) 03/23/2020  . HLD (hyperlipidemia) 03/23/2020  . AKI (acute kidney injury) ([place]) 03/23/2020  . CHF (congestive heart failure) ([place]) 03/23/2020  . Alcohol abuse with intoxication ([place]) 03/05/2020  . Chronic systolic CHF (congestive heart failure) ([place]) 10/31/2019  . Acute systolic CHF (congestive heart failure) ([place]) 08/02/2019  . Diarrhea 07/29/2019  . Gastroenteritis 07/22/2019  . Hypomagnesemia 06/19/2019  . Chest pain 06/07/2019  . Elevated troponin 05/23/2019  . Tobacco abuse 05/23/2019  . Alcohol abuse 02/21/2019  . Frequent falls 01/17/2019  . Severe mitral regurgitation   . Fall 11/01/2018  . Hyponatremia 10/31/2018  . Chronic atrial fibrillation   . Chronic hyponatremia 08/16/2018  . NSVT (nonsustained ventricular tachycardia) ([place])   . Concussion with loss of consciousness   . Scalp laceration   . Trauma   . Permanent atrial fibrillation   . Hypertensive heart disease   . Shortness of breath   . Pyogenic inflammation of bone ([place])   . Cirrhosis ([place]) 03/23/2018  . Right ankle pain 03/23/2018  . Syncope 08/07/2017  . Acute on chronic combined systolic and diastolic CHF (congestive heart failure) ([place])  07/09/2017  . Atrial flutter ([place]) 07/09/2017  . Pre-syncope 07/08/2017  . Neuropathy 05/08/2017  . Substance induced mood disorder ([place]) 10/06/2016  . CVA (cerebral vascular accident) ([place]) 09/18/2016  . Left sided numbness   . Homelessness 08/21/2016  . S/P ORIF (open reduction internal fixation) fracture 08/01/2016  . CAD (coronary artery disease), native coronary artery 07/30/2016  . Surgery, elective   . Insomnia 07/22/2016  . NSTEMI (non-ST elevated myocardial infarction) ([place])   . Anemia 07/05/2016  . Thrombocytopenia ([place]) 07/05/2016  . Cocaine abuse ([place]) 07/02/2016  . Chest pain on breathing 07/01/2016  . Essential hypertension 07/01/2016  . DM type 2 (diabetes mellitus, type 2) ([place]) 07/01/2016  . Hypokalemia 07/01/2016  . CKD (chronic kidney disease) stage 3, GFR 30-59 ml/min 07/01/2016  . Painful diabetic neuropathy ([place]) 07/01/2016  . Acute hyponatremia 05/27/2016  . Polysubstance abuse ([place]) 05/27/2016  . Chronic hepatitis C with cirrhosis ([place]) 05/27/2016  . Depression 04/21/2012  . GERD (gastroesophageal reflux disease) 02/16/2012  . History of drug abuse ([place])   . Heroin addiction ([place]) 01/29/2012    Current Outpatient Medications:  .  amiodarone (PACERONE) 200 MG tablet, Take 1 tablet (200 mg total) by mouth daily., Disp: 30 tablet, Rfl: 0 .  aspirin EC 81 MG tablet, Take 1 tablet (81 mg total) by mouth daily. Swallow whole. (Patient not taking: Reported on 08/01/2020), Disp: 90 tablet, Rfl: 3 .  atorvastatin (LIPITOR) 20 MG tablet, Take 1 tablet (20 mg total) by mouth daily at 6 PM., Disp: 30 tablet, Rfl: 3 .  dapagliflozin propanediol (FARXIGA) 5 MG TABS tablet, Take 1 tablet (  5 mg total) by mouth daily before breakfast., Disp: 30 tablet, Rfl: 3 .  digoxin (LANOXIN) 0.125 MG tablet, Take 1 tablet (0.125 mg total) by mouth daily., Disp: 30 tablet, Rfl: 3 .  gabapentin (NEURONTIN) 300 MG capsule, Take 2 capsules (600 mg total) by mouth 2 (two) times daily.,  Disp: 60 capsule, Rfl: 3 .  isosorbide-hydrALAZINE (BIDIL) 20-37.5 MG tablet, Take 1 tablet by mouth 3 (three) times daily., Disp: 90 tablet, Rfl: 3 .  oxyCODONE-acetaminophen (PERCOCET) 7.5-325 MG tablet, Take 1-2 tablets by mouth every 6 (six) hours as needed for moderate pain. (Patient not taking: Reported on 08/06/2020), Disp: 20 tablet, Rfl: 0 .  polyethylene glycol (MIRALAX / GLYCOLAX) 17 g packet, Take 17 g by mouth daily as needed for moderate constipation. (Patient not taking: Reported on 08/06/2020), Disp: 14 each, Rfl: 0 .  spironolactone (ALDACTONE) 25 MG tablet, Take 0.5 tablets (12.5 mg total) by mouth daily., Disp: 30 tablet, Rfl: 3 .  torsemide (DEMADEX) 20 MG tablet, Take 2 tablets (40 mg total) by mouth daily., Disp: 30 tablet, Rfl: 2 .  vitamin B-12 1000 MCG tablet, Take 1 tablet (1,000 mcg total) by mouth daily. (Patient not taking: Reported on 08/06/2020), Disp: , Rfl:  Allergies  Allergen Reactions  . Angiotensin Receptor Blockers Anaphylaxis and Other (See Comments)    (Angioedema also with Lisinopril, therefore ARB's are contraindicated)  . Lisinopril Anaphylaxis and Swelling    Throat swelling  . Pamelor [Nortriptyline Hcl] Anaphylaxis and Swelling    Throat swells     Social History   Socioeconomic History  . Marital status: Single    Spouse name: Not on file  . Number of children: 3  . Years of education: 2y college  . Highest education level: Not on file  Occupational History  . 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 Smoking status: Current Every Day Smoker    Packs/day: 0.50    Years: 28.00    Pack years: 14.00    Types: Cigarettes  . Smokeless tobacco: Never Used  Vaping Use  . Vaping Use: Never used  Substance and Sexual Activity  . Alcohol use: Yes    Alcohol/week: 25.0 standard drinks    Types: 25 Cans of beer per week    Comment: "depends on the day"; reports not drinking every day,  "I stopped about 2-3 wekes ago" - but drank yesterday  . Drug use: Not Currently    Types: IV, Cocaine, Heroin    Comment: 08/17/2018 "no heroin since 2013; I use cocaine ~ once/month, last use 03/21/2019  . Sexual activity: Not Currently  Other Topics Concern  . Not on file  Social History Narrative   ** Merged History Encounter **       ** Merged History Encounter **       Incarcerated from 2006-2010, then 10/2011-12/2011.  Has been trying to get sober (no heroin, alcohol since 10/2011).    Social Determinants of Health   Financial Resource Strain:   . Difficulty of Paying Living Expenses: Not on file  Food Insecurity: No Food Insecurity  . Worried About Charity fundraiser in the Last Year: Never true  . Ran Out of Food in the Last Year: Never true  Transportation Needs:   . Lack of Transportation (Medical): Not on file  . Lack of Transportation (Non-Medical): Not on file  Physical Activity:   . Days of Exercise  per Week: Not on file  . Minutes of Exercise per Session: Not on file  Stress:   . Feeling of Stress : Not on file  Social Connections:   . Frequency of Communication with Friends and Family: Not on file  . Frequency of Social Gatherings with Friends and Family: Not on file  . Attends Religious Services: Not on file  . Active Member of Clubs or Organizations: Not on file  . Attends Archivist Meetings: Not on file  . Marital Status: Not on file  Intimate Partner Violence:   . Fear of Current or Ex-Partner: Not on file  . Emotionally Abused: Not on file  . Physically Abused: Not on file  . Sexually Abused: Not on file    Physical Exam Vitals reviewed.  Constitutional:      Appearance: He is normal weight.  HENT:     Head: Normocephalic.     Nose: Nose normal.     Mouth/Throat:     Mouth: Mucous membranes are moist.     Pharynx: Oropharynx is clear.  Eyes:     Pupils: Pupils are equal, round, and reactive to light.  Cardiovascular:     Rate  and Rhythm: Normal rate and regular rhythm.     Pulses: Normal pulses.     Heart sounds: Normal heart sounds.  Pulmonary:     Effort: Pulmonary effort is normal.     Breath sounds: Normal breath sounds.  Abdominal:     General: Abdomen is flat.     Palpations: Abdomen is soft.  Musculoskeletal:        General: Normal range of motion.     Cervical back: Normal range of motion.     Right lower leg: No edema.     Left lower leg: No edema.  Skin:    General: Skin is warm and dry.     Capillary Refill: Capillary refill takes less than 2 seconds.  Neurological:     General: No focal deficit present.     Mental Status: He is alert. Mental status is at baseline.  Psychiatric:        Mood and Affect: Mood normal.    Arrived for home visit for Brad [name] who was alert and oriented standing outside of his apartment reporting he feels "okay". Brad [name] stated he has been getting more sleep and not having increased pain or difficulty breathing. Vitals were obtained. Medications were reviewed and confirmed. Pill box filled accordingly. No refills needed. Brad [name] agreed to visit in one week. Home visit complete.   No refills.      Future Appointments  Date Time Provider [place]  09/10/2020 12:00 PM [facility] PA/NP [facility] None     ACTION: Home visit completed Next visit planned for one week

## 2020-08-13 NOTE — Telephone Encounter (Signed)
Pt called back during lunch, he stated he was returning [place] call  Best number [phone]

## 2020-08-13 NOTE — Telephone Encounter (Signed)
Call received from the patient.  He said that he has not heard back from the [employer] about his background check and he has been in touch with Tammy Sours, LCSW about this.

## 2020-08-15 ENCOUNTER — Telehealth: Payer: Self-pay

## 2020-08-15 NOTE — Telephone Encounter (Signed)
Call returned to patient # [phone], message left with call back requested to this CM

## 2020-08-16 ENCOUNTER — Emergency Department (HOSPITAL_COMMUNITY): Payer: Medicaid Other

## 2020-08-16 ENCOUNTER — Other Ambulatory Visit: Payer: Self-pay

## 2020-08-16 ENCOUNTER — Encounter (HOSPITAL_COMMUNITY): Payer: Self-pay | Admitting: Emergency Medicine

## 2020-08-16 ENCOUNTER — Inpatient Hospital Stay (HOSPITAL_COMMUNITY)
Admission: EM | Admit: 2020-08-16 | Discharge: 2020-08-18 | DRG: 641 | Disposition: A | Discharge status: Home / Self Care | Payer: Medicaid Other | Attending: Internal Medicine | Admitting: Internal Medicine

## 2020-08-16 DIAGNOSIS — I428 Other cardiomyopathies: Secondary | ICD-10-CM | POA: Diagnosis present

## 2020-08-16 DIAGNOSIS — Z8546 Personal history of malignant neoplasm of prostate: Secondary | ICD-10-CM | POA: Diagnosis not present

## 2020-08-16 DIAGNOSIS — Z8249 Family history of ischemic heart disease and other diseases of the circulatory system: Secondary | ICD-10-CM

## 2020-08-16 DIAGNOSIS — Z888 Allergy status to other drugs, medicaments and biological substances status: Secondary | ICD-10-CM | POA: Diagnosis not present

## 2020-08-16 DIAGNOSIS — E785 Hyperlipidemia, unspecified: Secondary | ICD-10-CM | POA: Diagnosis present

## 2020-08-16 DIAGNOSIS — Z8673 Personal history of transient ischemic attack (TIA), and cerebral infarction without residual deficits: Secondary | ICD-10-CM

## 2020-08-16 DIAGNOSIS — B182 Chronic viral hepatitis C: Secondary | ICD-10-CM | POA: Diagnosis not present

## 2020-08-16 DIAGNOSIS — E7800 Pure hypercholesterolemia, unspecified: Secondary | ICD-10-CM | POA: Diagnosis present

## 2020-08-16 DIAGNOSIS — E1122 Type 2 diabetes mellitus with diabetic chronic kidney disease: Secondary | ICD-10-CM | POA: Diagnosis present

## 2020-08-16 DIAGNOSIS — E1140 Type 2 diabetes mellitus with diabetic neuropathy, unspecified: Secondary | ICD-10-CM | POA: Diagnosis present

## 2020-08-16 DIAGNOSIS — S62501G Fracture of unspecified phalanx of right thumb, subsequent encounter for fracture with delayed healing: Secondary | ICD-10-CM | POA: Diagnosis not present

## 2020-08-16 DIAGNOSIS — F1020 Alcohol dependence, uncomplicated: Secondary | ICD-10-CM | POA: Diagnosis present

## 2020-08-16 DIAGNOSIS — I10 Essential (primary) hypertension: Secondary | ICD-10-CM | POA: Diagnosis present

## 2020-08-16 DIAGNOSIS — Z87891 Personal history of nicotine dependence: Secondary | ICD-10-CM | POA: Diagnosis not present

## 2020-08-16 DIAGNOSIS — I482 Chronic atrial fibrillation, unspecified: Secondary | ICD-10-CM

## 2020-08-16 DIAGNOSIS — I4821 Permanent atrial fibrillation: Secondary | ICD-10-CM | POA: Diagnosis present

## 2020-08-16 DIAGNOSIS — Z79899 Other long term (current) drug therapy: Secondary | ICD-10-CM

## 2020-08-16 DIAGNOSIS — F1010 Alcohol abuse, uncomplicated: Secondary | ICD-10-CM | POA: Diagnosis not present

## 2020-08-16 DIAGNOSIS — S62521A Displaced fracture of distal phalanx of right thumb, initial encounter for closed fracture: Secondary | ICD-10-CM | POA: Diagnosis present

## 2020-08-16 DIAGNOSIS — I130 Hypertensive heart and chronic kidney disease with heart failure and stage 1 through stage 4 chronic kidney disease, or unspecified chronic kidney disease: Secondary | ICD-10-CM | POA: Diagnosis present

## 2020-08-16 DIAGNOSIS — Z20822 Contact with and (suspected) exposure to covid-19: Secondary | ICD-10-CM | POA: Diagnosis present

## 2020-08-16 DIAGNOSIS — I252 Old myocardial infarction: Secondary | ICD-10-CM

## 2020-08-16 DIAGNOSIS — K7460 Unspecified cirrhosis of liver: Secondary | ICD-10-CM

## 2020-08-16 DIAGNOSIS — E860 Dehydration: Secondary | ICD-10-CM | POA: Diagnosis present

## 2020-08-16 DIAGNOSIS — E871 Hypo-osmolality and hyponatremia: Principal | ICD-10-CM | POA: Diagnosis present

## 2020-08-16 DIAGNOSIS — D631 Anemia in chronic kidney disease: Secondary | ICD-10-CM | POA: Diagnosis present

## 2020-08-16 DIAGNOSIS — E119 Type 2 diabetes mellitus without complications: Secondary | ICD-10-CM

## 2020-08-16 DIAGNOSIS — K219 Gastro-esophageal reflux disease without esophagitis: Secondary | ICD-10-CM | POA: Diagnosis present

## 2020-08-16 DIAGNOSIS — F17210 Nicotine dependence, cigarettes, uncomplicated: Secondary | ICD-10-CM | POA: Diagnosis present

## 2020-08-16 DIAGNOSIS — I2510 Atherosclerotic heart disease of native coronary artery without angina pectoris: Secondary | ICD-10-CM | POA: Diagnosis present

## 2020-08-16 DIAGNOSIS — Z981 Arthrodesis status: Secondary | ICD-10-CM

## 2020-08-16 DIAGNOSIS — R296 Repeated falls: Secondary | ICD-10-CM | POA: Diagnosis present

## 2020-08-16 DIAGNOSIS — S62501A Fracture of unspecified phalanx of right thumb, initial encounter for closed fracture: Secondary | ICD-10-CM

## 2020-08-16 DIAGNOSIS — E1159 Type 2 diabetes mellitus with other circulatory complications: Secondary | ICD-10-CM

## 2020-08-16 DIAGNOSIS — T502X5A Adverse effect of carbonic-anhydrase inhibitors, benzothiadiazides and other diuretics, initial encounter: Secondary | ICD-10-CM | POA: Diagnosis present

## 2020-08-16 DIAGNOSIS — W1830XA Fall on same level, unspecified, initial encounter: Secondary | ICD-10-CM | POA: Diagnosis present

## 2020-08-16 DIAGNOSIS — I5042 Chronic combined systolic (congestive) and diastolic (congestive) heart failure: Secondary | ICD-10-CM | POA: Diagnosis not present

## 2020-08-16 DIAGNOSIS — N183 Chronic kidney disease, stage 3 unspecified: Secondary | ICD-10-CM | POA: Diagnosis present

## 2020-08-16 DIAGNOSIS — W19XXXA Unspecified fall, initial encounter: Secondary | ICD-10-CM | POA: Diagnosis present

## 2020-08-16 LAB — CBC
HCT: 35.5 % — ABNORMAL LOW (ref 39.0–52.0)
Hemoglobin: 12.4 g/dL — ABNORMAL LOW (ref 13.0–17.0)
MCH: 30.3 pg (ref 26.0–34.0)
MCHC: 34.9 g/dL (ref 30.0–36.0)
MCV: 86.8 fL (ref 80.0–100.0)
Platelets: 319 10*3/uL (ref 150–400)
RBC: 4.09 MIL/uL — ABNORMAL LOW (ref 4.22–5.81)
RDW: 14.5 % (ref 11.5–15.5)
WBC: 4.2 10*3/uL (ref 4.0–10.5)
nRBC: 0 % (ref 0.0–0.2)

## 2020-08-16 LAB — I-STAT CHEM 8, ED
BUN: 18 mg/dL (ref 6–20)
Calcium, Ion: 1.05 mmol/L — ABNORMAL LOW (ref 1.15–1.40)
Chloride: 91 mmol/L — ABNORMAL LOW (ref 98–111)
Creatinine, Ser: 1.2 mg/dL (ref 0.61–1.24)
Glucose, Bld: 152 mg/dL — ABNORMAL HIGH (ref 70–99)
HCT: 38 % — ABNORMAL LOW (ref 39.0–52.0)
Hemoglobin: 12.9 g/dL — ABNORMAL LOW (ref 13.0–17.0)
Potassium: 4.8 mmol/L (ref 3.5–5.1)
Sodium: 121 mmol/L — ABNORMAL LOW (ref 135–145)
TCO2: 25 mmol/L (ref 22–32)

## 2020-08-16 LAB — BASIC METABOLIC PANEL
Anion gap: 9 (ref 5–15)
BUN: 15 mg/dL (ref 6–20)
CO2: 22 mmol/L (ref 22–32)
Calcium: 8.5 mg/dL — ABNORMAL LOW (ref 8.9–10.3)
Chloride: 90 mmol/L — ABNORMAL LOW (ref 98–111)
Creatinine, Ser: 1.21 mg/dL (ref 0.61–1.24)
GFR calc Af Amer: >60 mL/min (ref >60)
GFR calc non Af Amer: >60 mL/min (ref >60)
Glucose, Bld: 151 mg/dL — ABNORMAL HIGH (ref 70–99)
Potassium: 4.3 mmol/L (ref 3.5–5.1)
Sodium: 121 mmol/L — ABNORMAL LOW (ref 135–145)

## 2020-08-16 LAB — MAGNESIUM: Magnesium: 1.7 mg/dL (ref 1.7–2.4)

## 2020-08-16 LAB — URINALYSIS, ROUTINE W REFLEX MICROSCOPIC
Bacteria, UA: NONE SEEN
Bilirubin Urine: NEGATIVE
Glucose, UA: NEGATIVE mg/dL
Ketones, ur: NEGATIVE mg/dL
Leukocytes,Ua: NEGATIVE
Nitrite: NEGATIVE
Protein, ur: 100 mg/dL — AB
Specific Gravity, Urine: 1.005 (ref 1.005–1.030)
pH: 5 (ref 5.0–8.0)

## 2020-08-16 LAB — PHOSPHORUS: Phosphorus: 3.2 mg/dL (ref 2.5–4.6)

## 2020-08-16 LAB — GLUCOSE, CAPILLARY
Glucose-Capillary: 183 mg/dL — ABNORMAL HIGH (ref 70–99)
Glucose-Capillary: 100 mg/dL — ABNORMAL HIGH (ref 70–99)

## 2020-08-16 LAB — SODIUM, URINE, RANDOM: Sodium, Ur: <10 mmol/L

## 2020-08-16 LAB — SARS CORONAVIRUS 2 BY RT PCR (HOSPITAL ORDER, PERFORMED IN ~~LOC~~ HOSPITAL LAB): SARS Coronavirus 2: NEGATIVE

## 2020-08-16 LAB — BRAIN NATRIURETIC PEPTIDE: B Natriuretic Peptide: 1319.4 pg/mL — ABNORMAL HIGH (ref 0.0–100.0)

## 2020-08-16 MED ORDER — FUROSEMIDE 10 MG/ML IJ SOLN
40.0000 mg | Freq: Once | INTRAMUSCULAR | Status: AC
  Administered 2020-08-16: 40 mg via INTRAVENOUS
  Filled 2020-08-16: qty 4

## 2020-08-16 MED ORDER — ACETAMINOPHEN 325 MG PO TABS: 650.0000 mg | ORAL_TABLET | Freq: Four times a day (QID) | ORAL | Status: DC | PRN

## 2020-08-16 MED ORDER — LORAZEPAM 1 MG PO TABS
1.0000 mg | ORAL_TABLET | ORAL | Status: DC | PRN
  Administered 2020-08-17: 1 mg via ORAL
  Filled 2020-08-16: qty 1

## 2020-08-16 MED ORDER — ATORVASTATIN CALCIUM 20 MG PO TABS
20.0000 mg | ORAL_TABLET | Freq: Every day | ORAL | Status: DC
  Administered 2020-08-16 – 2020-08-17 (×2): 20 mg via ORAL
  Filled 2020-08-16 (×2): qty 1

## 2020-08-16 MED ORDER — OXYCODONE-ACETAMINOPHEN 5-325 MG PO TABS
1.0000 | ORAL_TABLET | Freq: Once | ORAL | Status: AC
  Administered 2020-08-16: 1 via ORAL
  Filled 2020-08-16: qty 1

## 2020-08-16 MED ORDER — ACETAMINOPHEN 650 MG RE SUPP: 650.0000 mg | Freq: Four times a day (QID) | RECTAL | Status: DC | PRN

## 2020-08-16 MED ORDER — ISOSORB DINITRATE-HYDRALAZINE 20-37.5 MG PO TABS
1.0000 | ORAL_TABLET | Freq: Three times a day (TID) | ORAL | Status: DC
  Administered 2020-08-16 – 2020-08-18 (×5): 1 via ORAL
  Filled 2020-08-16 (×8): qty 1

## 2020-08-16 MED ORDER — DIGOXIN 125 MCG PO TABS
0.1250 mg | ORAL_TABLET | Freq: Every day | ORAL | Status: DC
  Administered 2020-08-16 – 2020-08-18 (×3): 0.125 mg via ORAL
  Filled 2020-08-16 (×3): qty 1

## 2020-08-16 MED ORDER — AMIODARONE HCL 200 MG PO TABS
200.0000 mg | ORAL_TABLET | Freq: Every day | ORAL | Status: DC
  Administered 2020-08-16 – 2020-08-18 (×3): 200 mg via ORAL
  Filled 2020-08-16 (×3): qty 1

## 2020-08-16 MED ORDER — FOLIC ACID 1 MG PO TABS
1.0000 mg | ORAL_TABLET | Freq: Every day | ORAL | Status: DC
  Administered 2020-08-16 – 2020-08-18 (×3): 1 mg via ORAL
  Filled 2020-08-16 (×3): qty 1

## 2020-08-16 MED ORDER — INSULIN ASPART 100 UNIT/ML ~~LOC~~ SOLN
0.0000 [IU] | Freq: Every day | SUBCUTANEOUS | Status: DC
  Filled 2020-08-16: qty 0.05

## 2020-08-16 MED ORDER — ADULT MULTIVITAMIN W/MINERALS CH
1.0000 | ORAL_TABLET | Freq: Every day | ORAL | Status: DC
  Administered 2020-08-16 – 2020-08-18 (×3): 1 via ORAL
  Filled 2020-08-16 (×3): qty 1

## 2020-08-16 MED ORDER — LORAZEPAM 2 MG/ML IJ SOLN
0.0000 mg | Freq: Four times a day (QID) | INTRAMUSCULAR | Status: DC
  Filled 2020-08-16: qty 1

## 2020-08-16 MED ORDER — ENOXAPARIN SODIUM 40 MG/0.4ML ~~LOC~~ SOLN
40.0000 mg | SUBCUTANEOUS | Status: DC
  Administered 2020-08-16 – 2020-08-17 (×2): 40 mg via SUBCUTANEOUS
  Filled 2020-08-16 (×2): qty 0.4

## 2020-08-16 MED ORDER — LORAZEPAM 2 MG/ML IJ SOLN: 0.0000 mg | Freq: Two times a day (BID) | INTRAMUSCULAR | Status: DC

## 2020-08-16 MED ORDER — SODIUM CHLORIDE 0.9 % IV SOLN: INTRAVENOUS | Status: AC

## 2020-08-16 MED ORDER — THIAMINE HCL 100 MG PO TABS
100.0000 mg | ORAL_TABLET | Freq: Every day | ORAL | Status: DC
  Administered 2020-08-16 – 2020-08-18 (×3): 100 mg via ORAL
  Filled 2020-08-16 (×3): qty 1

## 2020-08-16 MED ORDER — THIAMINE HCL 100 MG/ML IJ SOLN: 100.0000 mg | Freq: Every day | INTRAMUSCULAR | Status: DC

## 2020-08-16 MED ORDER — MORPHINE SULFATE (PF) 2 MG/ML IV SOLN
2.0000 mg | INTRAVENOUS | Status: DC | PRN
  Administered 2020-08-16 – 2020-08-18 (×12): 2 mg via INTRAVENOUS
  Filled 2020-08-16 (×12): qty 1

## 2020-08-16 MED ORDER — GABAPENTIN 300 MG PO CAPS
600.0000 mg | ORAL_CAPSULE | Freq: Two times a day (BID) | ORAL | Status: DC
  Administered 2020-08-16 – 2020-08-18 (×5): 600 mg via ORAL
  Filled 2020-08-16 (×5): qty 2

## 2020-08-16 MED ORDER — LORAZEPAM 2 MG/ML IJ SOLN: 1.0000 mg | INTRAMUSCULAR | Status: DC | PRN

## 2020-08-16 MED ORDER — INSULIN ASPART 100 UNIT/ML ~~LOC~~ SOLN
0.0000 [IU] | Freq: Three times a day (TID) | SUBCUTANEOUS | Status: DC
  Administered 2020-08-16: 3 [IU] via SUBCUTANEOUS
  Administered 2020-08-17 (×2): 2 [IU] via SUBCUTANEOUS
  Administered 2020-08-17 – 2020-08-18 (×2): 3 [IU] via SUBCUTANEOUS
  Administered 2020-08-18: 2 [IU] via SUBCUTANEOUS
  Filled 2020-08-16: qty 0.15

## 2020-08-16 NOTE — Plan of Care (Signed)
  Problem: Health Behavior/Discharge Planning: Goal: Ability to manage health-related needs will improve Outcome: Progressing   Problem: Clinical Measurements: Goal: Will remain free from infection Outcome: Progressing Goal: Cardiovascular complication will be avoided Outcome: Progressing   Problem: Activity: Goal: Risk for activity intolerance will decrease Outcome: Progressing   Problem: Coping: Goal: Level of anxiety will decrease Outcome: Progressing   Problem: Elimination: Goal: Will not experience complications related to bowel motility Outcome: Progressing Goal: Will not experience complications related to urinary retention Outcome: Progressing   Problem: Pain Managment: Goal: General experience of comfort will improve Outcome: Progressing   Problem: Safety: Goal: Ability to remain free from injury will improve Outcome: Progressing   Problem: Skin Integrity: Goal: Risk for impaired skin integrity will decrease Outcome: Progressing
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
## 2020-08-16 NOTE — ED Triage Notes (Signed)
Per EMS-states he fell 2 days ago injuring right thumb-states he did not seek treatment at that time-states thumb pain, leg pain

## 2020-08-16 NOTE — ED Notes (Signed)
Pt aware urine sample is needed, will call when he is able to urinate.

## 2020-08-17 DIAGNOSIS — S62501G Fracture of unspecified phalanx of right thumb, subsequent encounter for fracture with delayed healing: Secondary | ICD-10-CM

## 2020-08-17 LAB — GLUCOSE, CAPILLARY
Glucose-Capillary: 146 mg/dL — ABNORMAL HIGH (ref 70–99)
Glucose-Capillary: 135 mg/dL — ABNORMAL HIGH (ref 70–99)
Glucose-Capillary: 187 mg/dL — ABNORMAL HIGH (ref 70–99)
Glucose-Capillary: 113 mg/dL — ABNORMAL HIGH (ref 70–99)

## 2020-08-17 LAB — COMPREHENSIVE METABOLIC PANEL
ALT: 14 U/L (ref 0–44)
AST: 19 U/L (ref 15–41)
Albumin: 3.1 g/dL — ABNORMAL LOW (ref 3.5–5.0)
Alkaline Phosphatase: 66 U/L (ref 38–126)
Anion gap: 10 (ref 5–15)
BUN: 19 mg/dL (ref 6–20)
CO2: 20 mmol/L — ABNORMAL LOW (ref 22–32)
Calcium: 8.5 mg/dL — ABNORMAL LOW (ref 8.9–10.3)
Chloride: 96 mmol/L — ABNORMAL LOW (ref 98–111)
Creatinine, Ser: 1.34 mg/dL — ABNORMAL HIGH (ref 0.61–1.24)
GFR calc Af Amer: >60 mL/min (ref >60)
GFR calc non Af Amer: 57 mL/min — ABNORMAL LOW (ref >60)
Glucose, Bld: 138 mg/dL — ABNORMAL HIGH (ref 70–99)
Potassium: 4 mmol/L (ref 3.5–5.1)
Sodium: 126 mmol/L — ABNORMAL LOW (ref 135–145)
Total Bilirubin: 0.4 mg/dL (ref 0.3–1.2)
Total Protein: 6.9 g/dL (ref 6.5–8.1)

## 2020-08-17 LAB — CBC
HCT: 32.6 % — ABNORMAL LOW (ref 39.0–52.0)
Hemoglobin: 11 g/dL — ABNORMAL LOW (ref 13.0–17.0)
MCH: 30 pg (ref 26.0–34.0)
MCHC: 33.7 g/dL (ref 30.0–36.0)
MCV: 88.8 fL (ref 80.0–100.0)
Platelets: 242 10*3/uL (ref 150–400)
RBC: 3.67 MIL/uL — ABNORMAL LOW (ref 4.22–5.81)
RDW: 14.7 % (ref 11.5–15.5)
WBC: 4.5 10*3/uL (ref 4.0–10.5)
nRBC: 0 % (ref 0.0–0.2)

## 2020-08-17 MED ORDER — BISACODYL 10 MG RE SUPP
10.0000 mg | Freq: Once | RECTAL | Status: AC
  Administered 2020-08-18: 10 mg via RECTAL
  Filled 2020-08-17: qty 1

## 2020-08-17 MED ORDER — SODIUM CHLORIDE 0.9 % IV SOLN: INTRAVENOUS | Status: AC

## 2020-08-17 NOTE — Progress Notes (Signed)
No changes from prior nurse's assessment. Will continue to assess patient.

## 2020-08-17 NOTE — Progress Notes (Addendum)
PROGRESS NOTE   Brad [name]  QIH:[id number]    DOB: 1961-09-21    DOA: 08/16/2020  PCP: Charlott Rakes, MD   I have briefly reviewed patients previous medical records in [facility].  Chief Complaint  Patient presents with  . thumb pain    Brief Narrative:  59 year old male, lives with a roommate, independent, PMH of chronic atrial fibrillation, nonischemic cardiomyopathy, chronic combined systolic and diastolic CHF, CAD, HTN, HLD, GERD, alcohol dependence, tobacco abuse, chronic hepatitis C with cirrhosis, DM type II, frequent falls, HTN who presented to the ED after mechanical fall and right thumb injury with pain.  He was recently hospitalized 8/10-8/14 for acute on chronic systolic CHF and was diuresed with Lasix.  Admitted for subacute on chronic hyponatremia with dehydration and acute on chronic kidney disease.  Continue gentle IV fluids while holding diuretics.  Possible DC home in a.m. pending improvement in sodium.   Assessment & Plan:  Principal Problem:   Hyponatremia Active Problems:   Chronic hepatitis C with cirrhosis (HCC)   Essential hypertension   DM type 2 (diabetes mellitus, type 2) (HCC)   CAD (coronary artery disease), native coronary artery   Chronic atrial fibrillation   Alcohol abuse   Fall   HTN (hypertension)   Chronic combined systolic and diastolic CHF (congestive heart failure) (HCC)   Avulsion fracture of phalanx of right thumb   Subacute on chronic hyponatremia/dehydration: Patient serum sodium in chart review were all over the place.  He likely has baseline serum sodium ranging in the high 120s to low 130s.  His chronic hyponatremia is likely multifactorial related to chronic CHF, cirrhosis and ongoing alcohol use.  Sodium on admission 121, clinically dehydrated likely secondary to ongoing diuretics and poor oral intake.  After brief IV fluids, sodium up to 126 but not yet close to baseline.  Also creatinine is gone up from 1.2-1.3.   Continue to hold diuretics.  Continue gentle IV normal saline hydration overnight.  Possible discharge home in a.m. pending improvement in his serum sodium.  Acute on stage III chronic kidney disease: Baseline creatinine not very clear as his creatinine has widely fluctuated and chart review.  We did go up from 1.2-1.34 since admission which is concerning.  Continue gentle IV fluids while holding diuretics and other nephrotoxic's.  Right thumb fracture: Splint.  Reports no open wounds.  Outpatient follow-up with Dr. Leanora Cover, hand surgeon.  Anemia of chronic disease: Stable.  Chronic hepatitis C with cirrhosis: Stable.  Essential hypertension: Reasonably controlled.  Continue BiDil.  Type II DM with renal complications: CBGs reasonably controlled inpatient on SSI alone.  Holding Page.  CAD/NICM/chronic combined CHF: Clinically continues to be somewhat dehydrated.  Holding diuretics while closely monitoring on IV fluids.  Follows outpatient with Dr. Aundra Dubin.  Most recent 2D echo showed LVEF of 30-35%.  Patient on torsemide (held).  Chronic atrial fibrillation: Rate controlled.  Not anticoagulation candidate due to alcohol dependence and frequent falls.  Continue amiodarone and digoxin.  Alcohol dependence: Reported drinking alcohol on day prior to hospital admission.  No overt withdrawal.  Continue CIWA protocol.  Frequent falls: Suspected due to alcohol abuse.  CT C-spine without acute findings.  Body mass index is 23.12 kg/m.   Dyslipidemia: Statins.   DVT prophylaxis: Lovenox Code Status: Full Family Communication: None at bedside Disposition:  Status is: Inpatient  Remains inpatient appropriate because:Persistent severe electrolyte disturbances   Dispo: The patient is from: Home  Anticipated d/c is to: Home              Anticipated d/c date is: 1 day              Patient currently is not medically stable to d/c.        Consultants:    None  Procedures:   None  Antimicrobials:   None   Subjective:  Feels better.  No dyspnea or chest pain reported.  Some pain in both fractured right home but denies open wound.  Objective:   Vitals:   08/16/20 2358 08/17/20 0446 08/17/20 0500 08/17/20 0818  BP: 119/73 139/72  132/80  Pulse: 63 (!) 55  (!) 50  Resp: 18 (!) 22  16  Temp: 98.4 F (36.9 C) 97.8 F (36.6 C)  (!) 97.5 F (36.4 C)  TempSrc: Oral Oral  Oral  SpO2: 99% 100%  100%  Weight:   75.2 kg   Height:        General exam: Pleasant young male, moderately built and nourished lying comfortably supine in bed.  Oral mucosa with borderline hydration. Respiratory system: Clear to auscultation. Respiratory effort normal. Cardiovascular system: S1 & S2 heard, RRR. No JVD, murmurs, rubs, gallops or clicks. No pedal edema.  Telemetry personally reviewed: A. fib with controlled ventricular rate in the 50s.  Occasional PVCs. Gastrointestinal system: Abdomen is nondistended, soft and nontender. No organomegaly or masses felt. Normal bowel sounds heard. Central nervous system: Alert and oriented. No focal neurological deficits. Extremities: Symmetric 5 x 5 power.  Right thumb in a splint. Skin: No rashes, lesions or ulcers Psychiatry: Judgement and insight appear normal. Mood & affect appropriate.     Data Reviewed:   I have personally reviewed following labs and imaging studies   CBC: Recent Labs  Lab 08/16/20 1025 08/16/20 1122 08/17/20 0551  WBC  --  4.2 4.5  HGB 12.9* 12.4* 11.0*  HCT 38.0* 35.5* 32.6*  MCV  --  86.8 88.8  PLT  --  319 053    Basic Metabolic Panel: Recent Labs  Lab 08/16/20 1025 08/16/20 1122 08/17/20 0551  NA 121* 121* 126*  K 4.8 4.3 4.0  CL 91* 90* 96*  CO2  --  22 20*  GLUCOSE 152* 151* 138*  BUN 18 15 19   CREATININE 1.20 1.21 1.34*  CALCIUM  --  8.5* 8.5*  MG  --  1.7  --   PHOS  --  3.2  --     Liver Function Tests: Recent Labs  Lab 08/17/20 0551  AST 19  ALT  14  ALKPHOS 66  BILITOT 0.4  PROT 6.9  ALBUMIN 3.1*    CBG: Recent Labs  Lab 08/16/20 2106 08/17/20 0745 08/17/20 1135  GLUCAP 100* 146* 135*    Microbiology Studies:   Recent Results (from the past 240 hour(s))  SARS Coronavirus 2 by RT PCR (hospital order, performed in [place] hospital lab) Nasopharyngeal Nasopharyngeal Swab     Status: None   Collection Time: 08/16/20 11:55 AM   Specimen: Nasopharyngeal Swab  Result Value Ref Range Status   SARS Coronavirus 2 NEGATIVE NEGATIVE Final    Comment: (NOTE) SARS-CoV-2 target nucleic acids are NOT DETECTED.  The SARS-CoV-2 RNA is generally detectable in upper and lower respiratory specimens during the acute phase of infection. The lowest concentration of SARS-CoV-2 viral copies this assay can detect is 250 copies / mL. A negative result does not preclude SARS-CoV-2 infection and should not be  used as the sole basis for treatment or other patient management decisions.  A negative result may occur with improper specimen collection / handling, submission of specimen other than nasopharyngeal swab, presence of viral mutation(s) within the areas targeted by this assay, and inadequate number of viral copies (<250 copies / mL). A negative result must be combined with clinical observations, patient history, and epidemiological information.  Fact Sheet for Patients:   StrictlyIdeas.no  Fact Sheet for Healthcare Providers: BankingDealers.co.za  This test is not yet approved or  cleared by the [place] FDA and has been authorized for detection and/or diagnosis of SARS-CoV-2 by FDA under an Emergency Use Authorization (EUA).  This EUA will remain in effect (meaning this test can be used) for the duration of the COVID-19 declaration under Section 564(b)(1) of the Act, 21 U.S.C. section 360bbb-3(b)(1), unless the authorization is terminated or revoked sooner.  Performed at  [facility], [place] [address] [place], [place] [address]      Radiology Studies:  CT Cervical Spine Wo Contrast  Result Date: 08/16/2020 CLINICAL DATA:  Pain following recent fall EXAM: CT CERVICAL SPINE WITHOUT CONTRAST TECHNIQUE: Multidetector CT imaging of the cervical spine was performed without intravenous contrast. Multiplanar CT image reconstructions were also generated. COMPARISON:  May 03, 2020 FINDINGS: Alignment: There is no appreciable spondylolisthesis. Skull base and vertebrae: Skull base and craniocervical junction regions appear normal. The patient is status post anterior screw and plate fixation from R4-B6 with disc spacers present at C4-5 and C5-6. Support hardware intact. No acute fracture. No blastic or lytic bone lesions. Soft tissues and spinal canal: The prevertebral soft tissues and predental space regions are normal. There is no appreciable cord or canal hematoma. No paraspinous lesions are evident. Disc levels: The disc spacers appear intact with disc spacers present at C4-5 and C5-6. There is there is diffuse disc protrusion at C4-5 with moderate spinal stenosis at this level. There is milder spinal stenosis at C5-6. There is severe exit foraminal narrowing bilaterally with impression on the exiting nerve roots bilaterally at C5-6. There is exit foraminal narrowing due to asymmetric disc protrusion at C6-7 on the left. Upper chest: Visualized upper lung regions are clear. Other: There is calcification in each proximal subclavian artery as well as in each carotid artery. IMPRESSION: 1.  No acute fracture or spondylolisthesis. 2. Status post anterior fusion from C4-C6 with disc spacers at C4-5 and C5-6. Support hardware intact. 3. Moderate spinal stenosis due to disc protrusion and bony hypertrophy at C4-5. Mild spinal stenosis at C5-6 with severe exit foraminal narrowing bilaterally due to bony hypertrophy. 4. Foci of calcification in each proximal subclavian and  mid carotid artery region. Electronically Signed   By: Lowella Grip III M.D.   On: 08/16/2020 11:21   DG Finger Thumb Right  Result Date: 08/16/2020 CLINICAL DATA:  Injury 2 thumb, status post fall 2 days ago. EXAM: RIGHT THUMB 2+V COMPARISON:  None FINDINGS: Comminuted fracture with mild displacement of the distal phalanx of the RIGHT thumb. Soft tissue swelling overlying the RIGHT thumb. Comminuted T-shaped fracture at the base of the proximal phalanx. Some impaction of the fracture and evidence of intra-articular extension. Mild anterior displacement of the distal aspect of the proximal phalanx IMPRESSION: Comminuted fractures of the distal and proximal phalanges of the RIGHT thumb, as described above. Intra-articular extension noted about the proximal phalangeal fracture at the base of the proximal phalanx. Comminuted fracture at the distal phalanx underlies the nail bed. Correlate  with any signs that would indicate the possibility "open fracture" with nail bed injury. Electronically Signed   By: Zetta Bills M.D.   On: 08/16/2020 08:22     Scheduled Meds:   . amiodarone  200 mg Oral Daily  . atorvastatin  20 mg Oral q1800  . digoxin  0.125 mg Oral Daily  . enoxaparin (LOVENOX) injection  40 mg Subcutaneous Q24H  . folic acid  1 mg Oral Daily  . gabapentin  600 mg Oral BID  . insulin aspart  0-15 Units Subcutaneous TID WC  . insulin aspart  0-5 Units Subcutaneous QHS  . isosorbide-hydrALAZINE  1 tablet Oral TID  . LORazepam  0-4 mg Intravenous Q6H   Followed by  . [START ON 08/18/2020] LORazepam  0-4 mg Intravenous Q12H  . multivitamin with minerals  1 tablet Oral Daily  . thiamine  100 mg Oral Daily   Or  . thiamine  100 mg Intravenous Daily    Continuous Infusions:   . sodium chloride       LOS: 1 day     Vernell Leep, MD, [place], [facility]. [facility]    To contact the attending provider between 7A-7P or the covering provider during after hours 7P-7A, please  log into the web site [email or web address] and access using universal [place] password for that web site. If you do not have the password, please call the hospital operator.  08/17/2020, 1:06 PM

## 2020-08-17 NOTE — TOC Initial Note (Signed)
Transition of Care [facility]) - Initial/Assessment Note    Patient Details  Name: Brad [name] MRN: [id number] Date of Birth: 14-Jun-1961  Transition of Care [facility]) CM/SW Contact:    Dessa Phi, RN Phone Number: 08/17/2020, 10:34 AM  Clinical Narrative:  CM referral for SA counseling-declines any resources-states he has it managed-he stopped taking drugs.He has an apt approved-ready in 1 month. Has pcp,pharmacy,home.Ambulatory.Will need transport @ d/c-bus pass in shadow chart. No further CM needs.               Expected Discharge Plan: Home/Self Care Barriers to Discharge: Continued Medical Work up   Patient Goals and CMS Choice Patient states their goals for this hospitalization and ongoing recovery are:: go home CMS Medicare.gov Compare Post Acute Care list provided to:: Patient Choice offered to / list presented to : NA  Expected Discharge Plan and Services Expected Discharge Plan: Home/Self Care   Discharge Planning Services: CM Consult Post Acute Care Choice: NA Living arrangements for the past 2 months: Apartment Expected Discharge Date:  (unknown)                                    Prior Living Arrangements/Services Living arrangements for the past 2 months: Apartment Lives with:: Roommate Patient language and need for interpreter reviewed:: Yes Do you feel safe going back to the place where you live?: Yes      Need for Family Participation in Patient Care: No (Comment) Care giver support system in place?: Yes (comment)   Criminal Activity/Legal Involvement Pertinent to Current Situation/Hospitalization: No - Comment as needed  Activities of Daily Living Home Assistive Devices/Equipment: Cane (specify quad or straight) (single point cane) ADL Screening (condition at time of admission) Patient's cognitive ability adequate to safely complete daily activities?: Yes Is the patient deaf or have difficulty hearing?: No Does the patient have difficulty seeing,  even when wearing glasses/contacts?: No Does the patient have difficulty concentrating, remembering, or making decisions?: No Patient able to express need for assistance with ADLs?: Yes Does the patient have difficulty dressing or bathing?: Yes Independently performs ADLs?: Yes (appropriate for developmental age) Does the patient have difficulty walking or climbing stairs?: Yes Weakness of Legs: Both Weakness of Arms/Hands: Right  Permission Sought/Granted Permission sought to share information with : Case Manager Permission granted to share information with : Yes, Verbal Permission Granted  Share Information with NAME: Case Manager  Permission granted to share info w AGENCY: Leeanne Deed with [facility]        Emotional Assessment Appearance:: Appears stated age Attitude/Demeanor/Rapport: Gracious Affect (typically observed): Accepting Orientation: : Oriented to Self, Oriented to Place, Oriented to  Time, Oriented to Situation Alcohol / Substance Use: Illicit Drugs, Alcohol Use, Tobacco Use (Declines SA counseling) Psych Involvement: No (comment)  Admission diagnosis:  Hyponatremia [E87.1] Closed displaced fracture of distal phalanx of right thumb, initial encounter [F35.456Y] Patient Active Problem List   Diagnosis Date Noted  . Avulsion fracture of phalanx of right thumb 08/16/2020  . CHF exacerbation ([place]) 08/01/2020  . Cord compression ([place])   . Acute exacerbation of CHF (congestive heart failure) ([place]) 05/29/2020  . Chronic combined systolic and diastolic CHF (congestive heart failure) ([place]) 04/19/2020  . Alcohol abuse with intoxication ([place]) 03/23/2020  . Hyponatremia 03/23/2020  . Fall 03/23/2020  . Normocytic anemia 03/23/2020  . Leukopenia 03/23/2020  . HTN (hypertension) 03/23/2020  . HLD (hyperlipidemia) 03/23/2020  . AKI (  acute kidney injury) ([place]) 03/23/2020  . CHF (congestive heart failure) ([place]) 03/23/2020  . Alcohol abuse with intoxication ([place]) 03/05/2020  .  Chronic systolic CHF (congestive heart failure) ([place]) 10/31/2019  . Acute systolic CHF (congestive heart failure) ([place]) 08/02/2019  . Diarrhea 07/29/2019  . Gastroenteritis 07/22/2019  . Hypomagnesemia 06/19/2019  . Chest pain 06/07/2019  . Elevated troponin 05/23/2019  . Tobacco abuse 05/23/2019  . Alcohol abuse 02/21/2019  . Frequent falls 01/17/2019  . Severe mitral regurgitation   . Fall 11/01/2018  . Hyponatremia 10/31/2018  . Chronic atrial fibrillation   . Chronic hyponatremia 08/16/2018  . NSVT (nonsustained ventricular tachycardia) ([place])   . Concussion with loss of consciousness   . Scalp laceration   . Trauma   . Permanent atrial fibrillation   . Hypertensive heart disease   . Shortness of breath   . Pyogenic inflammation of bone ([place])   . Cirrhosis ([place]) 03/23/2018  . Right ankle pain 03/23/2018  . Syncope 08/07/2017  . Acute on chronic combined systolic and diastolic CHF (congestive heart failure) ([place]) 07/09/2017  . Atrial flutter ([place]) 07/09/2017  . Pre-syncope 07/08/2017  . Neuropathy 05/08/2017  . Substance induced mood disorder ([place]) 10/06/2016  . CVA (cerebral vascular accident) ([place]) 09/18/2016  . Left sided numbness   . Homelessness 08/21/2016  . S/P ORIF (open reduction internal fixation) fracture 08/01/2016  . CAD (coronary artery disease), native coronary artery 07/30/2016  . Surgery, elective   . Insomnia 07/22/2016  . NSTEMI (non-ST elevated myocardial infarction) ([place])   . Anemia 07/05/2016  . Thrombocytopenia ([place]) 07/05/2016  . Cocaine abuse ([place]) 07/02/2016  . Chest pain on breathing 07/01/2016  . Essential hypertension 07/01/2016  . DM type 2 (diabetes mellitus, type 2) ([place]) 07/01/2016  . Hypokalemia 07/01/2016  . CKD (chronic kidney disease) stage 3, GFR 30-59 ml/min 07/01/2016  . Painful diabetic neuropathy ([place]) 07/01/2016  . Acute hyponatremia 05/27/2016  . Polysubstance abuse ([place]) 05/27/2016  . Chronic hepatitis C with cirrhosis  ([place]) 05/27/2016  . Depression 04/21/2012  . GERD (gastroesophageal reflux disease) 02/16/2012  . History of drug abuse ([place])   . Heroin addiction ([place]) 01/29/2012   PCP:  Charlott Rakes, MD Pharmacy:   [facility], [place] [address] Phone: [phone] Fax: [phone]  [place], [place] [address] [place] [address] Phone: [phone] Fax: [place], [place] - [facility] [address] [place] [address] Phone: [phone] Fax: [phone]     Social Determinants of Health (SDOH) Interventions    Readmission Risk Interventions Readmission Risk Prevention Plan 08/17/2020 07/23/2020 06/13/2020  Transportation Screening Complete Complete Complete  PCP or Specialist Appt within 5-7 Days - - -  Home Care Screening - - -  Medication Review (RN CM) - - -  Medication Review (RN Care Manager) - Referral to Pharmacy Complete  PCP or Specialist appointment within 3-5 days of discharge Complete Complete Complete  PCP/Specialist Appt Not Complete comments - - -  [place] or Home Care Consult Complete Complete Complete  HRI or Home Care Consult Pt Refusal Comments - - -  SW Recovery Care/Counseling Consult Complete Complete Complete  Palliative Care Screening Not Applicable Not Applicable Not Applicable  Skilled Nursing Facility Not Applicable Complete Complete  Some recent data might be hidden

## 2020-08-18 LAB — GLUCOSE, CAPILLARY
Glucose-Capillary: 148 mg/dL — ABNORMAL HIGH (ref 70–99)
Glucose-Capillary: 173 mg/dL — ABNORMAL HIGH (ref 70–99)

## 2020-08-18 LAB — BASIC METABOLIC PANEL
Anion gap: 9 (ref 5–15)
BUN: 18 mg/dL (ref 6–20)
CO2: 21 mmol/L — ABNORMAL LOW (ref 22–32)
Calcium: 8.5 mg/dL — ABNORMAL LOW (ref 8.9–10.3)
Chloride: 99 mmol/L (ref 98–111)
Creatinine, Ser: 1.18 mg/dL (ref 0.61–1.24)
GFR calc Af Amer: >60 mL/min (ref >60)
GFR calc non Af Amer: >60 mL/min (ref >60)
Glucose, Bld: 174 mg/dL — ABNORMAL HIGH (ref 70–99)
Potassium: 4 mmol/L (ref 3.5–5.1)
Sodium: 129 mmol/L — ABNORMAL LOW (ref 135–145)

## 2020-08-18 MED ORDER — FOLIC ACID 1 MG PO TABS: 1.0000 mg | ORAL_TABLET | Freq: Every day | ORAL | 0 refills | Status: DC

## 2020-08-18 MED ORDER — ADULT MULTIVITAMIN W/MINERALS CH: 1.0000 | ORAL_TABLET | Freq: Every day | ORAL | Status: DC

## 2020-08-18 NOTE — Discharge Summary (Addendum)
Physician Discharge Summary  Brad [name] YIF:[id number] DOB: [birth year]  PCP: Charlott Rakes, MD  Admitted from: Home Discharged to: Home  Admit date: 08/16/2020 Discharge date: 08/18/2020  Recommendations for Outpatient Follow-up:    Follow-up Information    Leanora Cover, MD. Schedule an appointment as soon as possible for a visit in 1 week(s).   Specialty: Orthopedic Surgery Why: Please call MDs office on Monday, 08/20/2020 for a early appointment. Contact information: [place] [address] [phone]        Charlott Rakes, MD. Schedule an appointment as soon as possible for a visit in 1 week(s).   Specialty: Family Medicine Why: To be seen with repeat labs (CBC & BMP). Contact information: [place] [address] [phone]        [name], MD .   Specialty: Cardiology Contact information: [place] [address] [phone]        Larey Dresser, MD. Schedule an appointment as soon as possible for a visit.   Specialty: Cardiology Contact information: [address] [place] [address] [phone]                Home Health: None Equipment/Devices: None  Discharge Condition: Improved and stable CODE STATUS: Full Diet recommendation: Heart healthy & diabetic diet.  Discharge Diagnoses:  Principal Problem:   Hyponatremia Active Problems:   Chronic hepatitis C with cirrhosis (HCC)   Essential hypertension   DM type 2 (diabetes mellitus, type 2) (HCC)   CAD (coronary artery disease), native coronary artery   Chronic atrial fibrillation   Alcohol abuse   Fall   HTN (hypertension)   Chronic combined systolic and diastolic CHF (congestive heart failure) (HCC)   Avulsion fracture of phalanx of right thumb   Brief Summary: 59 year old male, lives with a roommate, independent, PMH of chronic atrial fibrillation, nonischemic cardiomyopathy,  chronic combined systolic and diastolic CHF, CAD, HTN, HLD, GERD, alcohol dependence, tobacco abuse, chronic hepatitis C with cirrhosis, DM type II, frequent falls, HTN who presented to the ED after mechanical fall and right thumb injury with pain.  He was recently hospitalized 8/10-8/14 for acute on chronic systolic CHF and was diuresed with Lasix.  Admitted for subacute on chronic hyponatremia with dehydration and acute on chronic kidney disease.    Briefly hydrated with gentle IV fluids.  Clinically improved.   Assessment & Plan:   Subacute on chronic hyponatremia/dehydration: Patient serum sodium in chart review were all over the place.  He likely has baseline serum sodium ranging in the high 120s to low 130s.  His chronic hyponatremia is likely multifactorial related to chronic CHF, cirrhosis and ongoing alcohol use.  Sodium on admission 121, clinically dehydrated likely secondary to ongoing diuretics and poor oral intake.  After brief IV fluids, sodium up to 126 but was not yet close to baseline.  Also creatinine is gone up from 1.2-1.3.    Held diuretics and continued gentle IV normal saline hydration overnight.    Sodium up to 129, likely his baseline.  Asymptomatic.  Discharged home to resume prior home cardiac medications including diuretics.  Close outpatient follow-up with BMP.  Acute on stage III chronic kidney disease: Baseline creatinine not very clear as his creatinine has widely fluctuated and chart review.  We did go up from 1.2-1.34 since admission which is concerning.    Post hydration creatinine has normalized.  Follow BMP closely as outpatient.  Right thumb fracture (distal  and proximal phalanx): EDP discussed with orthopedics MD on call, chart was reviewed and agreed on thumb cast. Splint was placed.  Reports no open wounds.  Outpatient follow-up with Dr. Leanora Cover, hand surgeon.  Reviewed PDMP, patient has failed opioid pain medications multiple times this month including  as recently as 8/18.  He reports that he has some of these medications at home.  He is advised to continue these and no new prescriptions for pain medications were given.  Anemia of chronic disease: Stable.  Chronic hepatitis C with cirrhosis: Stable.  Alcohol cessation was counseled.  Essential hypertension: Reasonably controlled.  Continue BiDil.  Type II DM with renal complications: CBGs reasonably controlled inpatient on SSI alone.   Resume Farxiga at discharge.  CAD/NICM/chronic combined CHF: Clinically was dehydrated.    Diuretics were briefly held in the hospital.  [place] outpatient with Dr. Aundra Dubin.  Most recent 2D echo showed LVEF of 30-35%.   Continue all cardiac medications at discharge.  Chronic atrial fibrillation: Rate controlled.  Not anticoagulation candidate due to alcohol dependence and frequent falls.  Continue amiodarone and digoxin.  Alcohol dependence: Reported drinking alcohol on day prior to hospital admission.  No overt withdrawal.    Low CIWA score.  Frequent falls: Suspected due to alcohol abuse.  CT C-spine without acute findings.  PT evaluated and no home recommendations made.  Body mass index is 23.12 kg/m.   Dyslipidemia: Statins.   Discharge Instructions  Discharge Instructions    (HEART FAILURE PATIENTS) Call MD:  Anytime you have any of the following symptoms: 1) 3 pound weight gain in 24 hours or 5 pounds in 1 week 2) shortness of breath, with or without a dry hacking cough 3) swelling in the hands, feet or stomach 4) if you have to sleep on extra pillows at night in order to breathe.   Complete by: As directed    Call MD for:  difficulty breathing, headache or visual disturbances   Complete by: As directed    Call MD for:  extreme fatigue   Complete by: As directed    Call MD for:  persistant dizziness or light-headedness   Complete by: As directed    Call MD for:  redness, tenderness, or signs of infection (pain, swelling,  redness, odor or green/yellow discharge around incision site)   Complete by: As directed    Call MD for:  severe uncontrolled pain   Complete by: As directed    Call MD for:  temperature >100.4   Complete by: As directed    Diet - low sodium heart healthy   Complete by: As directed    Diet Carb Modified   Complete by: As directed    Increase activity slowly   Complete by: As directed    No wound care   Complete by: As directed        Medication List    TAKE these medications   amiodarone 200 MG tablet Commonly known as: PACERONE Take 1 tablet (200 mg total) by mouth daily.   aspirin EC 81 MG tablet Take 1 tablet (81 mg total) by mouth daily. Swallow whole.   atorvastatin 20 MG tablet Commonly known as: LIPITOR Take 1 tablet (20 mg total) by mouth daily at 6 PM.   dapagliflozin propanediol 5 MG Tabs tablet Commonly known as: Farxiga Take 1 tablet (5 mg total) by mouth daily before breakfast.   digoxin 0.125 MG tablet Commonly known as: LANOXIN Take 1 tablet (0.125 mg total) by  mouth daily.   folic acid 1 MG tablet Commonly known as: FOLVITE Take 1 tablet (1 mg total) by mouth daily. Start taking on: August 19, 2020   gabapentin 300 MG capsule Commonly known as: NEURONTIN Take 2 capsules (600 mg total) by mouth 2 (two) times daily.   ibuprofen 200 MG tablet Commonly known as: ADVIL Take 400 mg by mouth every 6 (six) hours as needed for headache or mild pain.   isosorbide-hydrALAZINE 20-37.5 MG tablet Commonly known as: BIDIL Take 1 tablet by mouth 3 (three) times daily.   multivitamin with minerals Tabs tablet Take 1 tablet by mouth daily. Start taking on: August 19, 2020   potassium chloride SA 20 MEQ tablet Commonly known as: KLOR-CON Take 20 mEq by mouth daily.   spironolactone 25 MG tablet Commonly known as: ALDACTONE Take 0.5 tablets (12.5 mg total) by mouth daily.   thiamine 100 MG tablet Commonly known as: Vitamin B-1 Take 100 mg by mouth  daily.   torsemide 20 MG tablet Commonly known as: DEMADEX Take 2 tablets (40 mg total) by mouth daily.      Allergies  Allergen Reactions  . Angiotensin Receptor Blockers Anaphylaxis and Other (See Comments)    (Angioedema also with Lisinopril, therefore ARB's are contraindicated)  . Lisinopril Anaphylaxis and Swelling    Throat swelling  . Pamelor [Nortriptyline Hcl] Anaphylaxis and Swelling    Throat swells      Procedures/Studies:  CT Cervical Spine Wo Contrast  Result Date: 08/16/2020 CLINICAL DATA:  Pain following recent fall EXAM: CT CERVICAL SPINE WITHOUT CONTRAST TECHNIQUE: Multidetector CT imaging of the cervical spine was performed without intravenous contrast. Multiplanar CT image reconstructions were also generated. COMPARISON:  May 03, 2020 FINDINGS: Alignment: There is no appreciable spondylolisthesis. Skull base and vertebrae: Skull base and craniocervical junction regions appear normal. The patient is status post anterior screw and plate fixation from J1-H4 with disc spacers present at C4-5 and C5-6. Support hardware intact. No acute fracture. No blastic or lytic bone lesions. Soft tissues and spinal canal: The prevertebral soft tissues and predental space regions are normal. There is no appreciable cord or canal hematoma. No paraspinous lesions are evident. Disc levels: The disc spacers appear intact with disc spacers present at C4-5 and C5-6. There is there is diffuse disc protrusion at C4-5 with moderate spinal stenosis at this level. There is milder spinal stenosis at C5-6. There is severe exit foraminal narrowing bilaterally with impression on the exiting nerve roots bilaterally at C5-6. There is exit foraminal narrowing due to asymmetric disc protrusion at C6-7 on the left. Upper chest: Visualized upper lung regions are clear. Other: There is calcification in each proximal subclavian artery as well as in each carotid artery. IMPRESSION: 1.  No acute fracture or  spondylolisthesis. 2. Status post anterior fusion from C4-C6 with disc spacers at C4-5 and C5-6. Support hardware intact. 3. Moderate spinal stenosis due to disc protrusion and bony hypertrophy at C4-5. Mild spinal stenosis at C5-6 with severe exit foraminal narrowing bilaterally due to bony hypertrophy. 4. Foci of calcification in each proximal subclavian and mid carotid artery region. Electronically Signed   By: Lowella Grip III M.D.   On: 08/16/2020 11:21   DG Finger Thumb Right  Result Date: 08/16/2020 CLINICAL DATA:  Injury 2 thumb, status post fall 2 days ago. EXAM: RIGHT THUMB 2+V COMPARISON:  None FINDINGS: Comminuted fracture with mild displacement of the distal phalanx of the RIGHT thumb. Soft tissue swelling overlying the RIGHT  thumb. Comminuted T-shaped fracture at the base of the proximal phalanx. Some impaction of the fracture and evidence of intra-articular extension. Mild anterior displacement of the distal aspect of the proximal phalanx IMPRESSION: Comminuted fractures of the distal and proximal phalanges of the RIGHT thumb, as described above. Intra-articular extension noted about the proximal phalangeal fracture at the base of the proximal phalanx. Comminuted fracture at the distal phalanx underlies the nail bed. Correlate with any signs that would indicate the possibility "open fracture" with nail bed injury. Electronically Signed   By: Zetta Bills M.D.   On: 08/16/2020 08:22     Subjective: Denies complaints.  Feels much better.  No neck pain.  No dyspnea or chest pain.  Some right thumb pain but able to move his thumb and other fingers well.  Anxious to go home.  Discharge Exam:  Vitals:   08/17/20 1747 08/17/20 2044 08/18/20 0448 08/18/20 0746  BP: 140/74 138/78 (!) 153/88   Pulse: 75 (!) 55 (!) 55 63  Resp: 17 20 18    Temp: (!) 97.5 F (36.4 C) 98.4 F (36.9 C) 97.8 F (36.6 C)   TempSrc: Oral Oral Oral   SpO2: 100% 100% 99%   Weight:      Height:         General exam: Pleasant young male, moderately built and nourished lying comfortably supine in bed.  Oral mucosa moist Respiratory system: Clear to auscultation. Respiratory effort normal. Cardiovascular system: S1 & S2 heard, RRR. No JVD, murmurs, rubs, gallops or clicks. No pedal edema.  Telemetry personally reviewed: A. fib with controlled ventricular rate. Gastrointestinal system: Abdomen is nondistended, soft and nontender. No organomegaly or masses felt. Normal bowel sounds heard. Central nervous system: Alert and oriented. No focal neurological deficits. Extremities: Symmetric 5 x 5 power.  Right thumb in a splint.  No features of neuro vascular compromise.  Able to move his right thumb-restricted by splint.  Also able to move other 4 fingers well. Skin: No rashes, lesions or ulcers Psychiatry: Judgement and insight appear normal. Mood & affect appropriate.    The results of significant diagnostics from this hospitalization (including imaging, microbiology, ancillary and laboratory) are listed below for reference.     Microbiology: Recent Results (from the past 240 hour(s))  SARS Coronavirus 2 by RT PCR (hospital order, performed in [facility] hospital lab) Nasopharyngeal Nasopharyngeal Swab     Status: None   Collection Time: 08/16/20 11:55 AM   Specimen: Nasopharyngeal Swab  Result Value Ref Range Status   SARS Coronavirus 2 NEGATIVE NEGATIVE Final    Comment: (NOTE) SARS-CoV-2 target nucleic acids are NOT DETECTED.  The SARS-CoV-2 RNA is generally detectable in upper and lower respiratory specimens during the acute phase of infection. The lowest concentration of SARS-CoV-2 viral copies this assay can detect is 250 copies / mL. A negative result does not preclude SARS-CoV-2 infection and should not be used as the sole basis for treatment or other patient management decisions.  A negative result may occur with improper specimen collection / handling, submission of specimen  other than nasopharyngeal swab, presence of viral mutation(s) within the areas targeted by this assay, and inadequate number of viral copies (<250 copies / mL). A negative result must be combined with clinical observations, patient history, and epidemiological information.  Fact Sheet for Patients:   StrictlyIdeas.no  Fact Sheet for Healthcare Providers: BankingDealers.co.za  This test is not yet approved or  cleared by the [place] FDA and has been authorized  for detection and/or diagnosis of SARS-CoV-2 by FDA under an Emergency Use Authorization (EUA).  This EUA will remain in effect (meaning this test can be used) for the duration of the COVID-19 declaration under Section 564(b)(1) of the Act, 21 U.S.C. section 360bbb-3(b)(1), unless the authorization is terminated or revoked sooner.  Performed at [facility], [address] [place], [place] [address]      Labs: CBC: Recent Labs  Lab 08/16/20 1025 08/16/20 1122 08/17/20 0551  WBC  --  4.2 4.5  HGB 12.9* 12.4* 11.0*  HCT 38.0* 35.5* 32.6*  MCV  --  86.8 88.8  PLT  --  319 124    Basic Metabolic Panel: Recent Labs  Lab 08/16/20 1025 08/16/20 1122 08/17/20 0551 08/18/20 0658  NA 121* 121* 126* 129*  K 4.8 4.3 4.0 4.0  CL 91* 90* 96* 99  CO2  --  22 20* 21*  GLUCOSE 152* 151* 138* 174*  BUN 18 15 19 18   CREATININE 1.20 1.21 1.34* 1.18  CALCIUM  --  8.5* 8.5* 8.5*  MG  --  1.7  --   --   PHOS  --  3.2  --   --     Liver Function Tests: Recent Labs  Lab 08/17/20 0551  AST 19  ALT 14  ALKPHOS 66  BILITOT 0.4  PROT 6.9  ALBUMIN 3.1*    CBG: Recent Labs  Lab 08/17/20 0745 08/17/20 1135 08/17/20 1651 08/17/20 2040 08/18/20 0818  GLUCAP 146* 135* 187* 113* 148*   Urinalysis    Component Value Date/Time   COLORURINE YELLOW 08/16/2020 1155   APPEARANCEUR CLEAR 08/16/2020 1155   LABSPEC 1.005 08/16/2020 1155   PHURINE 5.0  08/16/2020 1155   GLUCOSEU NEGATIVE 08/16/2020 1155   HGBUR SMALL (A) 08/16/2020 1155   BILIRUBINUR NEGATIVE 08/16/2020 1155   BILIRUBINUR neg 05/08/2017 1503   KETONESUR NEGATIVE 08/16/2020 1155   PROTEINUR 100 (A) 08/16/2020 1155   UROBILINOGEN 0.2 05/08/2017 1503   UROBILINOGEN 1.0 04/26/2012 1328   NITRITE NEGATIVE 08/16/2020 1155   LEUKOCYTESUR NEGATIVE 08/16/2020 1155      Time coordinating discharge: 25 minutes  SIGNED:  Vernell Leep, MD, FACP, [facility]. [facility]  To contact the attending provider between 7A-7P or the covering provider during after hours 7P-7A, please log into the web site [email or web address] and access using universal Oasis password for that web site. If you do not have the password, please call the hospital operator.

## 2020-08-18 NOTE — Discharge Instructions (Signed)

## 2020-08-18 NOTE — Evaluation (Signed)
Physical Therapy Evaluation Patient Details Name: Brad [name] MRN: [id number] DOB: 1961-08-06 Today's Date: 08/18/2020   History of Present Illness  59 yo male admitted with hyponatremia and R thumb fx after falling at home. Hx of repeated falls, cervical fusion 07/24/20, a fib, CHF, CAD, cirrhosis, DM, frequent falls, CKD, polysubstance abuse  Clinical Impression  On eval, pt was Supv-Mod Ind for mobility. He walked ~125 feet around the unit. No overt LOB. Pt reported moderate pain in R thumb. He was adamant that he did not need any PT. Assisted pt back to bed at his request. Will plan to follow during hospital stay. Recommend daily ambulation in hallways with nursing supervision.     Follow Up Recommendations Supervision - Intermittent    Equipment Recommendations  None recommended by PT    Recommendations for Other Services       Precautions / Restrictions Precautions Precautions: Fall Restrictions Weight Bearing Restrictions: No Other Position/Activity Restrictions: Per pt, he was told he no longer had to wear cervical collar but still had precautions      Mobility  Bed Mobility Overal bed mobility: Modified Independent                Transfers Overall transfer level: Modified independent                  Ambulation/Gait Ambulation/Gait assistance: Supervision Gait Distance (Feet): 125 Feet Assistive device: None;IV Pole Gait Pattern/deviations: Step-through pattern     General Gait Details: No overt LOB. Pt tolerated distance well. Walked x 1 with IV pole support and x 1 without UE support  Stairs            Wheelchair Mobility    Modified [name] (Stroke Patients Only)       Balance Overall balance assessment: History of Falls           Standing balance-Leahy Scale: Fair                               Pertinent Vitals/Pain Pain Assessment: 0-10 Pain Score: 5  Pain Location: R thumb Pain Descriptors / Indicators:  Discomfort;Sore Pain Intervention(s): Monitored during session    Home Living Family/patient expects to be discharged to:: Private residence Living Arrangements: Other (Comment) (roommate) Available Help at Discharge: Friend(s) Type of Home: Apartment Home Access: Level entry     Home Layout: One level Home Equipment: Environmental consultant - 2 wheels;Cane - single point      Prior Function Level of Independence: Independent with assistive device(s)         Comments: uses cane PRN     Hand Dominance   Dominant Hand: Right    Extremity/Trunk Assessment   Upper Extremity Assessment Upper Extremity Assessment: Defer to OT evaluation RUE Deficits / Details: R hand/thumb in splint         Cervical / Trunk Assessment Cervical / Trunk Assessment: Normal  Communication   Communication: No difficulties  Cognition Arousal/Alertness: Awake/alert Behavior During Therapy: WFL for tasks assessed/performed Overall Cognitive Status: Within Functional Limits for tasks assessed                                        General Comments      Exercises     Assessment/Plan    PT Assessment Patient needs continued PT services  PT Problem List  Decreased balance;Pain       PT Treatment Interventions Gait training;Therapeutic activities;Therapeutic exercise;Patient/family education;Functional mobility training;Balance training    PT Goals (Current goals can be found in the Care Plan section)  Acute Rehab PT Goals Patient Stated Goal: to go home PT Goal Formulation: With patient Time For Goal Achievement: 09/01/20 Potential to Achieve Goals: Good    Frequency Min 3X/week   Barriers to discharge        Co-evaluation               AM-PAC PT "6 Clicks" Mobility  Outcome Measure Help needed turning from your back to your side while in a flat bed without using bedrails?: None Help needed moving from lying on your back to sitting on the side of a flat bed without  using bedrails?: None Help needed moving to and from a bed to a chair (including a wheelchair)?: None Help needed standing up from a chair using your arms (e.g., wheelchair or bedside chair)?: None Help needed to walk in hospital room?: A Little Help needed climbing 3-5 steps with a railing? : A Little 6 Click Score: 22    End of Session   Activity Tolerance: Patient tolerated treatment well Patient left: in bed;with call bell/phone within reach;with bed alarm set   PT Visit Diagnosis: Repeated falls (R29.6);History of falling (Z91.81)    Time: 1020-1028 PT Time Calculation (min) (ACUTE ONLY): 8 min   Charges:   PT Evaluation $PT Eval Low Complexity: Downs, PT Acute Rehabilitation  Office: [phone] Pager: [phone]

## 2020-08-18 NOTE — Progress Notes (Signed)
Went over discharge papers with patient.  All questions answered. VSS.  Patient requested to be wheeled out and left at the front to wait for ride.  Patient stable and ambulatory.

## 2020-08-20 ENCOUNTER — Inpatient Hospital Stay (HOSPITAL_COMMUNITY)
Admission: EM | Admit: 2020-08-20 | Discharge: 2020-08-23 | DRG: 291 | Disposition: A | Discharge status: Home / Self Care | Payer: Medicaid Other | Attending: Internal Medicine | Admitting: Internal Medicine

## 2020-08-20 ENCOUNTER — Encounter (HOSPITAL_COMMUNITY): Payer: Self-pay | Admitting: Internal Medicine

## 2020-08-20 ENCOUNTER — Other Ambulatory Visit: Payer: Self-pay

## 2020-08-20 ENCOUNTER — Emergency Department (HOSPITAL_COMMUNITY): Payer: Medicaid Other

## 2020-08-20 DIAGNOSIS — E8809 Other disorders of plasma-protein metabolism, not elsewhere classified: Secondary | ICD-10-CM | POA: Diagnosis present

## 2020-08-20 DIAGNOSIS — N182 Chronic kidney disease, stage 2 (mild): Secondary | ICD-10-CM

## 2020-08-20 DIAGNOSIS — F1020 Alcohol dependence, uncomplicated: Secondary | ICD-10-CM | POA: Diagnosis present

## 2020-08-20 DIAGNOSIS — R296 Repeated falls: Secondary | ICD-10-CM

## 2020-08-20 DIAGNOSIS — I4821 Permanent atrial fibrillation: Secondary | ICD-10-CM | POA: Diagnosis present

## 2020-08-20 DIAGNOSIS — E871 Hypo-osmolality and hyponatremia: Secondary | ICD-10-CM | POA: Diagnosis present

## 2020-08-20 DIAGNOSIS — I2510 Atherosclerotic heart disease of native coronary artery without angina pectoris: Secondary | ICD-10-CM | POA: Diagnosis present

## 2020-08-20 DIAGNOSIS — K219 Gastro-esophageal reflux disease without esophagitis: Secondary | ICD-10-CM | POA: Diagnosis present

## 2020-08-20 DIAGNOSIS — G8929 Other chronic pain: Secondary | ICD-10-CM | POA: Diagnosis present

## 2020-08-20 DIAGNOSIS — I5023 Acute on chronic systolic (congestive) heart failure: Secondary | ICD-10-CM

## 2020-08-20 DIAGNOSIS — I4892 Unspecified atrial flutter: Secondary | ICD-10-CM | POA: Diagnosis present

## 2020-08-20 DIAGNOSIS — M1990 Unspecified osteoarthritis, unspecified site: Secondary | ICD-10-CM | POA: Diagnosis present

## 2020-08-20 DIAGNOSIS — F329 Major depressive disorder, single episode, unspecified: Secondary | ICD-10-CM | POA: Diagnosis present

## 2020-08-20 DIAGNOSIS — Z888 Allergy status to other drugs, medicaments and biological substances status: Secondary | ICD-10-CM

## 2020-08-20 DIAGNOSIS — S62501A Fracture of unspecified phalanx of right thumb, initial encounter for closed fracture: Secondary | ICD-10-CM | POA: Diagnosis present

## 2020-08-20 DIAGNOSIS — Z20822 Contact with and (suspected) exposure to covid-19: Secondary | ICD-10-CM | POA: Diagnosis present

## 2020-08-20 DIAGNOSIS — Z9119 Patient's noncompliance with other medical treatment and regimen: Secondary | ICD-10-CM

## 2020-08-20 DIAGNOSIS — R0602 Shortness of breath: Secondary | ICD-10-CM | POA: Diagnosis not present

## 2020-08-20 DIAGNOSIS — E119 Type 2 diabetes mellitus without complications: Secondary | ICD-10-CM

## 2020-08-20 DIAGNOSIS — F1010 Alcohol abuse, uncomplicated: Secondary | ICD-10-CM | POA: Diagnosis present

## 2020-08-20 DIAGNOSIS — K7030 Alcoholic cirrhosis of liver without ascites: Secondary | ICD-10-CM | POA: Diagnosis present

## 2020-08-20 DIAGNOSIS — E1122 Type 2 diabetes mellitus with diabetic chronic kidney disease: Secondary | ICD-10-CM | POA: Diagnosis present

## 2020-08-20 DIAGNOSIS — R809 Proteinuria, unspecified: Secondary | ICD-10-CM | POA: Diagnosis present

## 2020-08-20 DIAGNOSIS — E1140 Type 2 diabetes mellitus with diabetic neuropathy, unspecified: Secondary | ICD-10-CM | POA: Diagnosis present

## 2020-08-20 DIAGNOSIS — I5043 Acute on chronic combined systolic (congestive) and diastolic (congestive) heart failure: Secondary | ICD-10-CM | POA: Diagnosis not present

## 2020-08-20 DIAGNOSIS — F1490 Cocaine use, unspecified, uncomplicated: Secondary | ICD-10-CM | POA: Diagnosis present

## 2020-08-20 DIAGNOSIS — I083 Combined rheumatic disorders of mitral, aortic and tricuspid valves: Secondary | ICD-10-CM | POA: Diagnosis present

## 2020-08-20 DIAGNOSIS — I130 Hypertensive heart and chronic kidney disease with heart failure and stage 1 through stage 4 chronic kidney disease, or unspecified chronic kidney disease: Principal | ICD-10-CM | POA: Diagnosis present

## 2020-08-20 DIAGNOSIS — I428 Other cardiomyopathies: Secondary | ICD-10-CM | POA: Diagnosis present

## 2020-08-20 DIAGNOSIS — R0600 Dyspnea, unspecified: Secondary | ICD-10-CM

## 2020-08-20 DIAGNOSIS — F17210 Nicotine dependence, cigarettes, uncomplicated: Secondary | ICD-10-CM | POA: Diagnosis present

## 2020-08-20 DIAGNOSIS — Z79899 Other long term (current) drug therapy: Secondary | ICD-10-CM

## 2020-08-20 DIAGNOSIS — Z8673 Personal history of transient ischemic attack (TIA), and cerebral infarction without residual deficits: Secondary | ICD-10-CM

## 2020-08-20 DIAGNOSIS — Z981 Arthrodesis status: Secondary | ICD-10-CM | POA: Diagnosis not present

## 2020-08-20 DIAGNOSIS — E785 Hyperlipidemia, unspecified: Secondary | ICD-10-CM | POA: Diagnosis present

## 2020-08-20 DIAGNOSIS — Z8249 Family history of ischemic heart disease and other diseases of the circulatory system: Secondary | ICD-10-CM

## 2020-08-20 DIAGNOSIS — B182 Chronic viral hepatitis C: Secondary | ICD-10-CM | POA: Diagnosis present

## 2020-08-20 DIAGNOSIS — Z7984 Long term (current) use of oral hypoglycemic drugs: Secondary | ICD-10-CM

## 2020-08-20 DIAGNOSIS — I10 Essential (primary) hypertension: Secondary | ICD-10-CM | POA: Diagnosis present

## 2020-08-20 DIAGNOSIS — X58XXXA Exposure to other specified factors, initial encounter: Secondary | ICD-10-CM | POA: Diagnosis present

## 2020-08-20 DIAGNOSIS — I639 Cerebral infarction, unspecified: Secondary | ICD-10-CM | POA: Diagnosis present

## 2020-08-20 DIAGNOSIS — Z9181 History of falling: Secondary | ICD-10-CM

## 2020-08-20 DIAGNOSIS — Z8546 Personal history of malignant neoplasm of prostate: Secondary | ICD-10-CM

## 2020-08-20 DIAGNOSIS — I252 Old myocardial infarction: Secondary | ICD-10-CM

## 2020-08-20 DIAGNOSIS — S62509A Fracture of unspecified phalanx of unspecified thumb, initial encounter for closed fracture: Secondary | ICD-10-CM

## 2020-08-20 LAB — BASIC METABOLIC PANEL
Anion gap: 11 (ref 5–15)
Anion gap: 8 (ref 5–15)
BUN: 11 mg/dL (ref 6–20)
BUN: 12 mg/dL (ref 6–20)
CO2: 20 mmol/L — ABNORMAL LOW (ref 22–32)
CO2: 23 mmol/L (ref 22–32)
Calcium: 8.4 mg/dL — ABNORMAL LOW (ref 8.9–10.3)
Calcium: 8.6 mg/dL — ABNORMAL LOW (ref 8.9–10.3)
Chloride: 85 mmol/L — ABNORMAL LOW (ref 98–111)
Chloride: 86 mmol/L — ABNORMAL LOW (ref 98–111)
Creatinine, Ser: 1.13 mg/dL (ref 0.61–1.24)
Creatinine, Ser: 1.09 mg/dL (ref 0.61–1.24)
GFR calc Af Amer: >60 mL/min (ref >60)
GFR calc Af Amer: >60 mL/min (ref >60)
GFR calc non Af Amer: >60 mL/min (ref >60)
GFR calc non Af Amer: >60 mL/min (ref >60)
Glucose, Bld: 135 mg/dL — ABNORMAL HIGH (ref 70–99)
Glucose, Bld: 150 mg/dL — ABNORMAL HIGH (ref 70–99)
Potassium: 4.8 mmol/L (ref 3.5–5.1)
Potassium: 6.1 mmol/L — ABNORMAL HIGH (ref 3.5–5.1)
Sodium: 116 mmol/L — CL (ref 135–145)
Sodium: 117 mmol/L — CL (ref 135–145)

## 2020-08-20 LAB — CBC
HCT: 32.4 % — ABNORMAL LOW (ref 39.0–52.0)
Hemoglobin: 11.1 g/dL — ABNORMAL LOW (ref 13.0–17.0)
MCH: 30.2 pg (ref 26.0–34.0)
MCHC: 34.3 g/dL (ref 30.0–36.0)
MCV: 88 fL (ref 80.0–100.0)
Platelets: 219 10*3/uL (ref 150–400)
RBC: 3.68 MIL/uL — ABNORMAL LOW (ref 4.22–5.81)
RDW: 14.6 % (ref 11.5–15.5)
WBC: 4.2 10*3/uL (ref 4.0–10.5)
nRBC: 0 % (ref 0.0–0.2)

## 2020-08-20 LAB — CBC WITH DIFFERENTIAL/PLATELET
Abs Immature Granulocytes: 0.01 10*3/uL (ref 0.00–0.07)
Basophils Absolute: 0 10*3/uL (ref 0.0–0.1)
Basophils Relative: 1 %
Eosinophils Absolute: 0.1 10*3/uL (ref 0.0–0.5)
Eosinophils Relative: 2 %
HCT: 31.6 % — ABNORMAL LOW (ref 39.0–52.0)
Hemoglobin: 11 g/dL — ABNORMAL LOW (ref 13.0–17.0)
Immature Granulocytes: 0 %
Lymphocytes Relative: 9 %
Lymphs Abs: 0.3 10*3/uL — ABNORMAL LOW (ref 0.7–4.0)
MCH: 30.4 pg (ref 26.0–34.0)
MCHC: 34.8 g/dL (ref 30.0–36.0)
MCV: 87.3 fL (ref 80.0–100.0)
Monocytes Absolute: 0.4 10*3/uL (ref 0.1–1.0)
Monocytes Relative: 11 %
Neutro Abs: 3 10*3/uL (ref 1.7–7.7)
Neutrophils Relative %: 77 %
Platelets: 228 10*3/uL (ref 150–400)
RBC: 3.62 MIL/uL — ABNORMAL LOW (ref 4.22–5.81)
RDW: 14.7 % (ref 11.5–15.5)
WBC: 3.9 10*3/uL — ABNORMAL LOW (ref 4.0–10.5)
nRBC: 0 % (ref 0.0–0.2)

## 2020-08-20 LAB — MAGNESIUM: Magnesium: 1.7 mg/dL (ref 1.7–2.4)

## 2020-08-20 LAB — GLUCOSE, CAPILLARY
Glucose-Capillary: 152 mg/dL — ABNORMAL HIGH (ref 70–99)
Glucose-Capillary: 93 mg/dL (ref 70–99)

## 2020-08-20 LAB — COMPREHENSIVE METABOLIC PANEL
ALT: 15 U/L (ref 0–44)
AST: 19 U/L (ref 15–41)
Albumin: 3.4 g/dL — ABNORMAL LOW (ref 3.5–5.0)
Alkaline Phosphatase: 64 U/L (ref 38–126)
Anion gap: 9 (ref 5–15)
BUN: 11 mg/dL (ref 6–20)
CO2: 19 mmol/L — ABNORMAL LOW (ref 22–32)
Calcium: 8.3 mg/dL — ABNORMAL LOW (ref 8.9–10.3)
Chloride: 89 mmol/L — ABNORMAL LOW (ref 98–111)
Creatinine, Ser: 1.08 mg/dL (ref 0.61–1.24)
GFR calc Af Amer: >60 mL/min (ref >60)
GFR calc non Af Amer: >60 mL/min (ref >60)
Glucose, Bld: 125 mg/dL — ABNORMAL HIGH (ref 70–99)
Potassium: 4.9 mmol/L (ref 3.5–5.1)
Sodium: 117 mmol/L — CL (ref 135–145)
Total Bilirubin: 0.6 mg/dL (ref 0.3–1.2)
Total Protein: 7.5 g/dL (ref 6.5–8.1)

## 2020-08-20 LAB — PHOSPHORUS: Phosphorus: 3.5 mg/dL (ref 2.5–4.6)

## 2020-08-20 LAB — SARS CORONAVIRUS 2 BY RT PCR (HOSPITAL ORDER, PERFORMED IN ~~LOC~~ HOSPITAL LAB): SARS Coronavirus 2: NEGATIVE

## 2020-08-20 LAB — BRAIN NATRIURETIC PEPTIDE: B Natriuretic Peptide: 782.2 pg/mL — ABNORMAL HIGH (ref 0.0–100.0)

## 2020-08-20 MED ORDER — AMIODARONE HCL 200 MG PO TABS
200.0000 mg | ORAL_TABLET | Freq: Every day | ORAL | Status: DC
  Administered 2020-08-20 – 2020-08-23 (×4): 200 mg via ORAL
  Filled 2020-08-20 (×4): qty 1

## 2020-08-20 MED ORDER — MORPHINE SULFATE (PF) 4 MG/ML IV SOLN
6.0000 mg | Freq: Once | INTRAVENOUS | Status: AC
  Administered 2020-08-20: 6 mg via INTRAVENOUS
  Filled 2020-08-20: qty 2

## 2020-08-20 MED ORDER — HYDROCODONE-ACETAMINOPHEN 5-325 MG PO TABS
2.0000 | ORAL_TABLET | Freq: Once | ORAL | Status: AC
  Administered 2020-08-20: 2 via ORAL
  Filled 2020-08-20: qty 2

## 2020-08-20 MED ORDER — SODIUM CHLORIDE 0.9 % IV SOLN: INTRAVENOUS | Status: DC

## 2020-08-20 MED ORDER — FUROSEMIDE 10 MG/ML IJ SOLN
INTRAMUSCULAR | Status: AC
  Filled 2020-08-20: qty 4

## 2020-08-20 MED ORDER — ASPIRIN EC 81 MG PO TBEC
81.0000 mg | DELAYED_RELEASE_TABLET | Freq: Every day | ORAL | Status: DC
  Administered 2020-08-20 – 2020-08-23 (×4): 81 mg via ORAL
  Filled 2020-08-20 (×4): qty 1

## 2020-08-20 MED ORDER — SODIUM ZIRCONIUM CYCLOSILICATE 10 G PO PACK
10.0000 g | PACK | Freq: Every day | ORAL | Status: DC
  Administered 2020-08-20 – 2020-08-22 (×2): 10 g via ORAL
  Filled 2020-08-20 (×3): qty 1

## 2020-08-20 MED ORDER — VITAMIN B-1 100 MG PO TABS: 100.0000 mg | ORAL_TABLET | Freq: Every day | ORAL | Status: DC

## 2020-08-20 MED ORDER — NITROGLYCERIN IN D5W 200-5 MCG/ML-% IV SOLN
0.0000 ug/min | INTRAVENOUS | Status: DC
  Administered 2020-08-20: 5 ug/min via INTRAVENOUS
  Filled 2020-08-20: qty 250

## 2020-08-20 MED ORDER — LORAZEPAM 2 MG/ML IJ SOLN: 1.0000 mg | INTRAMUSCULAR | Status: DC | PRN

## 2020-08-20 MED ORDER — FOLIC ACID 1 MG PO TABS
1.0000 mg | ORAL_TABLET | Freq: Every day | ORAL | Status: DC
  Administered 2020-08-20 – 2020-08-23 (×4): 1 mg via ORAL
  Filled 2020-08-20 (×4): qty 1

## 2020-08-20 MED ORDER — THIAMINE HCL 100 MG PO TABS
100.0000 mg | ORAL_TABLET | Freq: Every day | ORAL | Status: DC
  Administered 2020-08-20 – 2020-08-23 (×4): 100 mg via ORAL
  Filled 2020-08-20 (×4): qty 1

## 2020-08-20 MED ORDER — ONDANSETRON HCL 4 MG/2ML IJ SOLN
INTRAMUSCULAR | Status: AC
  Filled 2020-08-20: qty 2

## 2020-08-20 MED ORDER — ATORVASTATIN CALCIUM 10 MG PO TABS
20.0000 mg | ORAL_TABLET | Freq: Every day | ORAL | Status: DC
  Administered 2020-08-20 – 2020-08-22 (×3): 20 mg via ORAL
  Filled 2020-08-20 (×3): qty 2

## 2020-08-20 MED ORDER — ACETAMINOPHEN 650 MG RE SUPP: 650.0000 mg | Freq: Four times a day (QID) | RECTAL | Status: DC | PRN

## 2020-08-20 MED ORDER — ADULT MULTIVITAMIN W/MINERALS CH
1.0000 | ORAL_TABLET | Freq: Every day | ORAL | Status: DC
  Administered 2020-08-21 – 2020-08-23 (×3): 1 via ORAL
  Filled 2020-08-20 (×3): qty 1

## 2020-08-20 MED ORDER — INSULIN ASPART 100 UNIT/ML ~~LOC~~ SOLN
0.0000 [IU] | Freq: Three times a day (TID) | SUBCUTANEOUS | Status: DC
  Administered 2020-08-20: 3 [IU] via SUBCUTANEOUS
  Administered 2020-08-21 (×3): 2 [IU] via SUBCUTANEOUS
  Administered 2020-08-22: 3 [IU] via SUBCUTANEOUS
  Administered 2020-08-22 – 2020-08-23 (×2): 2 [IU] via SUBCUTANEOUS
  Administered 2020-08-23: 3 [IU] via SUBCUTANEOUS
  Filled 2020-08-20: qty 0.15

## 2020-08-20 MED ORDER — LORAZEPAM 2 MG/ML IJ SOLN: 0.0000 mg | Freq: Two times a day (BID) | INTRAMUSCULAR | Status: DC

## 2020-08-20 MED ORDER — ISOSORB DINITRATE-HYDRALAZINE 20-37.5 MG PO TABS
1.0000 | ORAL_TABLET | Freq: Three times a day (TID) | ORAL | Status: DC
  Administered 2020-08-20 – 2020-08-23 (×9): 1 via ORAL
  Filled 2020-08-20 (×10): qty 1

## 2020-08-20 MED ORDER — THIAMINE HCL 100 MG/ML IJ SOLN: 100.0000 mg | Freq: Every day | INTRAMUSCULAR | Status: DC

## 2020-08-20 MED ORDER — SODIUM CHLORIDE 0.9 % IV SOLN: INTRAVENOUS | Status: AC

## 2020-08-20 MED ORDER — GABAPENTIN 300 MG PO CAPS
600.0000 mg | ORAL_CAPSULE | Freq: Two times a day (BID) | ORAL | Status: DC
  Administered 2020-08-20 – 2020-08-23 (×7): 600 mg via ORAL
  Filled 2020-08-20 (×7): qty 2

## 2020-08-20 MED ORDER — FOLIC ACID 1 MG PO TABS: 1.0000 mg | ORAL_TABLET | Freq: Every day | ORAL | Status: DC

## 2020-08-20 MED ORDER — ONDANSETRON HCL 4 MG/2ML IJ SOLN
4.0000 mg | Freq: Four times a day (QID) | INTRAMUSCULAR | Status: DC | PRN
  Administered 2020-08-20 – 2020-08-23 (×7): 4 mg via INTRAVENOUS
  Filled 2020-08-20 (×6): qty 2

## 2020-08-20 MED ORDER — LORAZEPAM 2 MG/ML IJ SOLN: 0.0000 mg | Freq: Four times a day (QID) | INTRAMUSCULAR | Status: DC

## 2020-08-20 MED ORDER — FUROSEMIDE 10 MG/ML IJ SOLN
40.0000 mg | Freq: Once | INTRAMUSCULAR | Status: AC
  Administered 2020-08-20: 40 mg via INTRAVENOUS
  Filled 2020-08-20: qty 4

## 2020-08-20 MED ORDER — ENOXAPARIN SODIUM 40 MG/0.4ML ~~LOC~~ SOLN
40.0000 mg | SUBCUTANEOUS | Status: DC
  Administered 2020-08-20 – 2020-08-22 (×3): 40 mg via SUBCUTANEOUS
  Filled 2020-08-20 (×3): qty 0.4

## 2020-08-20 MED ORDER — ACETAMINOPHEN 325 MG PO TABS: 650.0000 mg | ORAL_TABLET | Freq: Four times a day (QID) | ORAL | Status: DC | PRN

## 2020-08-20 MED ORDER — LORAZEPAM 1 MG PO TABS: 1.0000 mg | ORAL_TABLET | ORAL | Status: DC | PRN

## 2020-08-20 MED ORDER — DIGOXIN 125 MCG PO TABS
0.1250 mg | ORAL_TABLET | Freq: Every day | ORAL | Status: DC
  Administered 2020-08-20 – 2020-08-23 (×4): 0.125 mg via ORAL
  Filled 2020-08-20 (×4): qty 1

## 2020-08-20 MED ORDER — OXYCODONE-ACETAMINOPHEN 5-325 MG PO TABS
1.0000 | ORAL_TABLET | Freq: Four times a day (QID) | ORAL | Status: DC | PRN
  Administered 2020-08-20 – 2020-08-21 (×4): 1 via ORAL
  Filled 2020-08-20 (×4): qty 1

## 2020-08-20 MED ORDER — INSULIN ASPART 100 UNIT/ML ~~LOC~~ SOLN
0.0000 [IU] | Freq: Every day | SUBCUTANEOUS | Status: DC
  Filled 2020-08-20: qty 0.05

## 2020-08-20 NOTE — Progress Notes (Signed)
CRITICAL VALUE ALERT  Critical Value: 117  Date & Time Notied:  08/20/20 1650  Provider Notified: Dr. Wyline Copas  Orders Received/Actions taken: start IV fluids  Anothy Bufano, Laurel Dimmer, RN

## 2020-08-20 NOTE — ED Triage Notes (Signed)
Pt came from home via EMS.. C/c SOB around 0500. Does not increase with exertion Lower lobes sounded like some fluid present. Nonproductive cough. Afebrile, diarrhea for a week. CBG 129  PMH of afib, diabetes, CHF, HTN. Put on 2L [place] otw here for comfort measures, O2 sats were 95% on RA.

## 2020-08-20 NOTE — ED Notes (Signed)
Report given to Dana, RN
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
## 2020-08-20 NOTE — Plan of Care (Signed)
  Problem: Activity: Goal: Risk for activity intolerance will decrease Outcome: Progressing   Problem: Education: Goal: Knowledge of General Education information will improve Description: Including pain rating scale, medication(s)/side effects and non-pharmacologic comfort measures Outcome: Completed/Met

## 2020-08-20 NOTE — Progress Notes (Signed)
CRITICAL VALUE ALERT  Critical Value: 117  Date & Time Notied:  08/20/20  1430  Provider Notified: Dr. Kandra Nicolas, Laurel Dimmer, RN

## 2020-08-21 DIAGNOSIS — E871 Hypo-osmolality and hyponatremia: Secondary | ICD-10-CM

## 2020-08-21 DIAGNOSIS — I5043 Acute on chronic combined systolic (congestive) and diastolic (congestive) heart failure: Secondary | ICD-10-CM

## 2020-08-21 LAB — BASIC METABOLIC PANEL
Anion gap: 11 (ref 5–15)
Anion gap: 9 (ref 5–15)
BUN: 14 mg/dL (ref 6–20)
BUN: 15 mg/dL (ref 6–20)
CO2: 22 mmol/L (ref 22–32)
CO2: 22 mmol/L (ref 22–32)
Calcium: 8.8 mg/dL — ABNORMAL LOW (ref 8.9–10.3)
Calcium: 8.5 mg/dL — ABNORMAL LOW (ref 8.9–10.3)
Chloride: 87 mmol/L — ABNORMAL LOW (ref 98–111)
Chloride: 91 mmol/L — ABNORMAL LOW (ref 98–111)
Creatinine, Ser: 1.13 mg/dL (ref 0.61–1.24)
Creatinine, Ser: 1.18 mg/dL (ref 0.61–1.24)
GFR calc Af Amer: >60 mL/min (ref >60)
GFR calc Af Amer: >60 mL/min (ref >60)
GFR calc non Af Amer: >60 mL/min (ref >60)
GFR calc non Af Amer: >60 mL/min (ref >60)
Glucose, Bld: 114 mg/dL — ABNORMAL HIGH (ref 70–99)
Glucose, Bld: 106 mg/dL — ABNORMAL HIGH (ref 70–99)
Potassium: 4.2 mmol/L (ref 3.5–5.1)
Potassium: 4.3 mmol/L (ref 3.5–5.1)
Sodium: 120 mmol/L — ABNORMAL LOW (ref 135–145)
Sodium: 122 mmol/L — ABNORMAL LOW (ref 135–145)

## 2020-08-21 LAB — GLUCOSE, CAPILLARY
Glucose-Capillary: 123 mg/dL — ABNORMAL HIGH (ref 70–99)
Glucose-Capillary: 127 mg/dL — ABNORMAL HIGH (ref 70–99)
Glucose-Capillary: 129 mg/dL — ABNORMAL HIGH (ref 70–99)
Glucose-Capillary: 135 mg/dL — ABNORMAL HIGH (ref 70–99)

## 2020-08-21 MED ORDER — SODIUM CHLORIDE 0.9 % IV SOLN: INTRAVENOUS | Status: AC

## 2020-08-21 MED ORDER — OXYCODONE HCL 5 MG PO TABS
5.0000 mg | ORAL_TABLET | ORAL | Status: DC | PRN
  Administered 2020-08-21 – 2020-08-23 (×10): 5 mg via ORAL
  Filled 2020-08-21 (×10): qty 1

## 2020-08-21 MED ORDER — ACETAMINOPHEN 325 MG PO TABS
650.0000 mg | ORAL_TABLET | Freq: Four times a day (QID) | ORAL | Status: DC
  Administered 2020-08-21 – 2020-08-23 (×9): 650 mg via ORAL
  Filled 2020-08-21 (×9): qty 2

## 2020-08-21 NOTE — Assessment & Plan Note (Addendum)
-   holding diuretics for now; will resume at d/c - monitor respiratory status while on IVF

## 2020-08-21 NOTE — Assessment & Plan Note (Signed)
-  Continue Lipitor °

## 2020-08-21 NOTE — Progress Notes (Signed)
PROGRESS NOTE    Brad [name]   EGB:[id number]  DOB: [birth year]  DOA: 08/20/2020     1  PCP: Charlott Rakes, MD  CC: weakness, N/V  Hospital Course: Rodrigus [name] is a 59 y.o. 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  Patient improved with IV fluid hydration while in hospital.  Patient was subsequently discharged on 8/28. He again presented with hyponatremia, Na 116 and was started on IV fluids and admitted for further work-up and treatment.  He again admitted to ongoing alcohol consumption with approximately one 24-ounce can of beer daily.    Interval History:  Admitted yesterday with ongoing weakness and nausea at home.  He was found to be hyponatremic once again and started on gentle IV fluids.  Feeling a little better this morning but still weak and nauseous.  Appetite remains poor.  Old records reviewed in assessment of this patient  ROS: Constitutional: positive for fatigue and malaise, Respiratory: negative for cough, Cardiovascular: negative for chest pain and Gastrointestinal: negative for abdominal pain  Assessment & Plan: Alcohol abuse - monitor on CIWA; no s/s w/d at this time - continue folate, MVI, thiamine  Acute hyponatremia - likely multifactorial in setting of ongoing beer consumption and underlying CHF - continue NS infusion and trend BMP (currently responding) - will likely resume lasix at d/c with again instructing patient to remain abstinent   Acute on chronic combined systolic and diastolic CHF (congestive heart failure) (HCC) - holding lasix for now; will resume at d/c - monitor respiratory status while on IVF  DM type 2 (diabetes mellitus, type 2) ([place]) - continue SSI and CBGs  Essential hypertension -Continue current  regimen  CVA (cerebral vascular accident) ([place]) -Continue aspirin and Lipitor  Permanent atrial fibrillation -Continue aspirin and amiodarone  HLD (hyperlipidemia) -Continue Lipitor   Antimicrobials: None  DVT prophylaxis: Lovenox Code Status: Full Family Communication: None present Disposition Plan: Status is: Inpatient  Remains inpatient appropriate because:Persistent severe electrolyte disturbances, Unsafe d/c plan, IV treatments appropriate due to intensity of illness or inability to take PO and Inpatient level of care appropriate due to severity of illness   Dispo: The patient is from: Home              Anticipated d/c is to: Home              Anticipated d/c date is: 2 days              Patient currently is not medically stable to d/c.       Objective: Blood pressure (!) 120/59, pulse 72, temperature (!) 97.5 F (36.4 C), temperature source Oral, resp. rate (!) 21, height 5\' 11"  (1.803 m), weight 80.2 kg, SpO2 100 %.  Examination: General appearance: alert, cooperative and no distress Head: Normocephalic, without obvious abnormality, atraumatic Eyes: EOMI Lungs: clear to auscultation bilaterally Heart: regular rate and rhythm and S1, S2 normal Abdomen: normal findings: bowel sounds normal and soft, non-tender Extremities: Trace lower extremity edema Skin: mobility and turgor normal Neurologic: Grossly normal  Consultants:   None  Procedures:   None  Data Reviewed: I have personally reviewed following labs and imaging studies Results for orders placed or performed during the hospital encounter of 08/20/20 (from the past 24 hour(s))  Glucose, capillary     Status: Abnormal   Collection Time: 08/20/20  5:18 PM  Result Value Ref Range   Glucose-Capillary 152 (H) 70 - 99 mg/dL  Glucose, capillary     Status: None   Collection Time: 08/20/20  9:53 PM  Result Value Ref Range   Glucose-Capillary 93 70 - 99 mg/dL  Basic metabolic panel     Status:  Abnormal   Collection Time: 08/21/20  5:43 AM  Result Value Ref Range   Sodium 120 (L) 135 - 145 mmol/L   Potassium 4.2 3.5 - 5.1 mmol/L   Chloride 87 (L) 98 - 111 mmol/L   CO2 22 22 - 32 mmol/L   Glucose, Bld 114 (H) 70 - 99 mg/dL   BUN 14 6 - 20 mg/dL   Creatinine, Ser 1.13 0.61 - 1.24 mg/dL   Calcium 8.8 (L) 8.9 - 10.3 mg/dL   GFR calc non Af Amer >60 >60 mL/min   GFR calc Af Amer >60 >60 mL/min   Anion gap 11 5 - 15  Glucose, capillary     Status: Abnormal   Collection Time: 08/21/20  7:44 AM  Result Value Ref Range   Glucose-Capillary 123 (H) 70 - 99 mg/dL  Glucose, capillary     Status: Abnormal   Collection Time: 08/21/20 11:20 AM  Result Value Ref Range   Glucose-Capillary 127 (H) 70 - 99 mg/dL  Basic metabolic panel     Status: Abnormal   Collection Time: 08/21/20 11:53 AM  Result Value Ref Range   Sodium 122 (L) 135 - 145 mmol/L   Potassium 4.3 3.5 - 5.1 mmol/L   Chloride 91 (L) 98 - 111 mmol/L   CO2 22 22 - 32 mmol/L   Glucose, Bld 106 (H) 70 - 99 mg/dL   BUN 15 6 - 20 mg/dL   Creatinine, Ser 1.18 0.61 - 1.24 mg/dL   Calcium 8.5 (L) 8.9 - 10.3 mg/dL   GFR calc non Af Amer >60 >60 mL/min   GFR calc Af Amer >60 >60 mL/min   Anion gap 9 5 - 15  Glucose, capillary     Status: Abnormal   Collection Time: 08/21/20  4:18 PM  Result Value Ref Range   Glucose-Capillary 129 (H) 70 - 99 mg/dL   *Note: Due to a large number of results and/or encounters for the requested time period, some results have not been displayed. A complete set of results can be found in Results Review.    Recent Results (from the past 240 hour(s))  SARS Coronavirus 2 by RT PCR (hospital order, performed in [facility] hospital lab) Nasopharyngeal Nasopharyngeal Swab     Status: None   Collection Time: 08/16/20 11:55 AM   Specimen: Nasopharyngeal Swab  Result Value Ref Range Status   SARS Coronavirus 2 NEGATIVE NEGATIVE Final    Comment: (NOTE) SARS-CoV-2 target nucleic acids are NOT  DETECTED.  The SARS-CoV-2 RNA is generally detectable in upper and lower respiratory specimens during the acute phase of infection. The lowest concentration of SARS-CoV-2 viral copies this assay can detect is 250 copies / mL. A negative result does not preclude SARS-CoV-2 infection and should not be used as the sole basis for treatment or other patient management decisions.  A negative result may occur with improper specimen collection / handling, submission of specimen other than nasopharyngeal swab, presence of viral mutation(s) within the areas targeted by this assay, and inadequate number of viral copies (<250 copies / mL). A negative result must be combined with clinical observations, patient history, and epidemiological information.  Fact  Sheet for Patients:   StrictlyIdeas.no  Fact Sheet for Healthcare Providers: BankingDealers.co.za  This test is not yet approved or  cleared by the [place] FDA and has been authorized for detection and/or diagnosis of SARS-CoV-2 by FDA under an Emergency Use Authorization (EUA).  This EUA will remain in effect (meaning this test can be used) for the duration of the COVID-19 declaration under Section 564(b)(1) of the Act, 21 U.S.C. section 360bbb-3(b)(1), unless the authorization is terminated or revoked sooner.  Performed at [facility], [place] [address], [place], [place] [address]   SARS Coronavirus 2 by RT PCR (hospital order, performed in [facility] hospital lab) Nasopharyngeal Nasopharyngeal Swab     Status: None   Collection Time: 08/20/20  8:31 AM   Specimen: Nasopharyngeal Swab  Result Value Ref Range Status   SARS Coronavirus 2 NEGATIVE NEGATIVE Final    Comment: (NOTE) SARS-CoV-2 target nucleic acids are NOT DETECTED.  The SARS-CoV-2 RNA is generally detectable in upper and lower respiratory specimens during the acute phase of infection. The  lowest concentration of SARS-CoV-2 viral copies this assay can detect is 250 copies / mL. A negative result does not preclude SARS-CoV-2 infection and should not be used as the sole basis for treatment or other patient management decisions.  A negative result may occur with improper specimen collection / handling, submission of specimen other than nasopharyngeal swab, presence of viral mutation(s) within the areas targeted by this assay, and inadequate number of viral copies (<250 copies / mL). A negative result must be combined with clinical observations, patient history, and epidemiological information.  Fact Sheet for Patients:   StrictlyIdeas.no  Fact Sheet for Healthcare Providers: BankingDealers.co.za  This test is not yet approved or  cleared by the [place] FDA and has been authorized for detection and/or diagnosis of SARS-CoV-2 by FDA under an Emergency Use Authorization (EUA).  This EUA will remain in effect (meaning this test can be used) for the duration of the COVID-19 declaration under Section 564(b)(1) of the Act, 21 U.S.C. section 360bbb-3(b)(1), unless the authorization is terminated or revoked sooner.  Performed at [facility], [place] [address] [place], [place] [address]      Radiology Studies: DG Chest Port 1 View  Result Date: 08/20/2020 CLINICAL DATA:  Cough, shortness of breath, and mid chest pain this morning; history atrial fibrillation/flutter, CHF, cirrhosis, diabetes mellitus, ethanol abuse, hypertension, non ischemic cardiomyopathy, smoker, polysubstance abuse EXAM: PORTABLE CHEST 1 VIEW COMPARISON:  Portable exam 0904 hours compared to 07/31/2020 FINDINGS: Enlargement of cardiac silhouette with pulmonary vascular congestion. Mediastinal contours normal for slight degree of rotation to the LEFT. Linear subsegmental atelectasis LEFT mid lung. Hazy infiltrates in the lungs bilaterally,  favor mild pulmonary edema though infection not entirely exclude. No pleural effusion or pneumothorax. Prior cervical spine fusion. IMPRESSION: Enlargement of cardiac silhouette with vascular congestion and probable mild pulmonary edema as above. Linear subsegmental atelectasis LEFT upper lobe. Electronically Signed   By: Lavonia Dana M.D.   On: 08/20/2020 09:17   DG Chest Port 1 View  Final Result      Scheduled Meds: . acetaminophen  650 mg Oral QID  . amiodarone  200 mg Oral Daily  . aspirin EC  81 mg Oral Daily  . atorvastatin  20 mg Oral q1800  . digoxin  0.125 mg Oral Daily  . enoxaparin (LOVENOX) injection  40 mg Subcutaneous Q24H  . folic acid  1 mg Oral Daily  . gabapentin  600 mg Oral BID  . insulin aspart  0-15 Units Subcutaneous TID WC  . insulin aspart  0-5 Units Subcutaneous QHS  . isosorbide-hydrALAZINE  1 tablet Oral TID  . multivitamin with minerals  1 tablet Oral Daily  . sodium zirconium cyclosilicate  10 g Oral Daily  . thiamine  100 mg Oral Daily   Or  . thiamine  100 mg Intravenous Daily   PRN Meds: LORazepam **OR** LORazepam, ondansetron (ZOFRAN) IV, oxyCODONE Continuous Infusions: . sodium chloride Stopped (08/20/20 1300)  . sodium chloride 50 mL/hr at 08/21/20 0937      LOS: 1 day  Time spent: Greater than 50% of the 35 minute visit was spent in counseling/coordination of care for the patient as laid out in the A&P.   Dwyane Dee, MD [facility] 08/21/2020, 5:08 PM  Contact via secure chat.  To contact the attending provider between 7A-7P or the covering provider during after hours 7P-7A, please log into the web site [email or web address] and access using universal [place] password for that web site. If you do not have the password, please call the hospital operator.

## 2020-08-21 NOTE — Assessment & Plan Note (Signed)
Continue Lipitor. Aspirin held secondary to initiation of Eliquis. Follow-up with cardiology. 

## 2020-08-21 NOTE — Hospital Course (Addendum)
Brad [name] is a 59 y.o. 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  Patient improved with IV fluid hydration while in hospital.  Patient was subsequently discharged on 8/28. He again presented with hyponatremia, Na 116 and was started on IV fluids and admitted for further work-up and treatment.  He again admitted to ongoing alcohol consumption with approximately one 24-ounce can of beer daily.  His torsemide was held on admission and he was started on fluids as previously noted.  Sodium levels slowly up trended and his symptoms improved substantially.  He was able to tolerate a diet fairly well prior to discharge. He was resumed on his home medications at time of discharge. He was also recommended to continue abstaining from further excessive alcohol use which he was told was contributing to his hyponatremia.  Also, he will follow up outpatient with hand surgeon, Dr. Leanora Cover, regarding his right thumb fracture.  He was again given contact information for calling to establish follow-up visit.  After prior note review, he has failed outpatient opioid pain medications with multiple prescriptions noted over the past month.  He was instructed to continue Tylenol at discharge and was not given any further opioid prescriptions.

## 2020-08-21 NOTE — Assessment & Plan Note (Signed)
Continue current regimen

## 2020-08-21 NOTE — Assessment & Plan Note (Signed)
-   continue SSI and CBGs 

## 2020-08-21 NOTE — Assessment & Plan Note (Addendum)
-   likely multifactorial in setting of ongoing beer consumption and underlying CHF - continue NS infusion and trend BMP (currently responding) - home meds resumed at discharge

## 2020-08-21 NOTE — Assessment & Plan Note (Addendum)
-   monitor on CIWA; no s/s w/d at this time - continue folate, MVI, thiamine

## 2020-08-21 NOTE — Assessment & Plan Note (Signed)
-  Continue aspirin and amiodarone

## 2020-08-22 ENCOUNTER — Inpatient Hospital Stay (HOSPITAL_COMMUNITY): Payer: Medicaid Other

## 2020-08-22 LAB — BASIC METABOLIC PANEL
Anion gap: 9 (ref 5–15)
Anion gap: 10 (ref 5–15)
BUN: 14 mg/dL (ref 6–20)
BUN: 13 mg/dL (ref 6–20)
CO2: 21 mmol/L — ABNORMAL LOW (ref 22–32)
CO2: 22 mmol/L (ref 22–32)
Calcium: 8.6 mg/dL — ABNORMAL LOW (ref 8.9–10.3)
Calcium: 8.6 mg/dL — ABNORMAL LOW (ref 8.9–10.3)
Chloride: 92 mmol/L — ABNORMAL LOW (ref 98–111)
Chloride: 89 mmol/L — ABNORMAL LOW (ref 98–111)
Creatinine, Ser: 1.18 mg/dL (ref 0.61–1.24)
Creatinine, Ser: 1.3 mg/dL — ABNORMAL HIGH (ref 0.61–1.24)
GFR calc Af Amer: >60 mL/min (ref >60)
GFR calc Af Amer: >60 mL/min (ref >60)
GFR calc non Af Amer: >60 mL/min (ref >60)
GFR calc non Af Amer: 59 mL/min — ABNORMAL LOW (ref >60)
Glucose, Bld: 132 mg/dL — ABNORMAL HIGH (ref 70–99)
Glucose, Bld: 153 mg/dL — ABNORMAL HIGH (ref 70–99)
Potassium: 4.2 mmol/L (ref 3.5–5.1)
Potassium: 4.1 mmol/L (ref 3.5–5.1)
Sodium: 122 mmol/L — ABNORMAL LOW (ref 135–145)
Sodium: 121 mmol/L — ABNORMAL LOW (ref 135–145)

## 2020-08-22 LAB — CBC WITH DIFFERENTIAL/PLATELET
Abs Immature Granulocytes: 0.01 10*3/uL (ref 0.00–0.07)
Basophils Absolute: 0 10*3/uL (ref 0.0–0.1)
Basophils Relative: 1 %
Eosinophils Absolute: 0.1 10*3/uL (ref 0.0–0.5)
Eosinophils Relative: 1 %
HCT: 29.6 % — ABNORMAL LOW (ref 39.0–52.0)
Hemoglobin: 9.9 g/dL — ABNORMAL LOW (ref 13.0–17.0)
Immature Granulocytes: 0 %
Lymphocytes Relative: 5 %
Lymphs Abs: 0.2 10*3/uL — ABNORMAL LOW (ref 0.7–4.0)
MCH: 29.6 pg (ref 26.0–34.0)
MCHC: 33.4 g/dL (ref 30.0–36.0)
MCV: 88.6 fL (ref 80.0–100.0)
Monocytes Absolute: 0.7 10*3/uL (ref 0.1–1.0)
Monocytes Relative: 16 %
Neutro Abs: 3.5 10*3/uL (ref 1.7–7.7)
Neutrophils Relative %: 77 %
Platelets: 157 10*3/uL (ref 150–400)
RBC: 3.34 MIL/uL — ABNORMAL LOW (ref 4.22–5.81)
RDW: 14.8 % (ref 11.5–15.5)
WBC: 4.6 10*3/uL (ref 4.0–10.5)
nRBC: 0 % (ref 0.0–0.2)

## 2020-08-22 LAB — GLUCOSE, CAPILLARY
Glucose-Capillary: 148 mg/dL — ABNORMAL HIGH (ref 70–99)
Glucose-Capillary: 133 mg/dL — ABNORMAL HIGH (ref 70–99)
Glucose-Capillary: 185 mg/dL — ABNORMAL HIGH (ref 70–99)
Glucose-Capillary: 129 mg/dL — ABNORMAL HIGH (ref 70–99)

## 2020-08-22 LAB — MAGNESIUM: Magnesium: 1.9 mg/dL (ref 1.7–2.4)

## 2020-08-22 NOTE — Plan of Care (Signed)
  Problem: Health Behavior/Discharge Planning: Goal: Ability to manage health-related needs will improve Outcome: Progressing   Problem: Clinical Measurements: Goal: Ability to maintain clinical measurements within normal limits will improve Outcome: Progressing Goal: Will remain free from infection Outcome: Progressing Goal: Diagnostic test results will improve Outcome: Progressing Goal: Respiratory complications will improve Outcome: Progressing Goal: Cardiovascular complication will be avoided Outcome: Progressing   Problem: Activity: Goal: Risk for activity intolerance will decrease Outcome: Progressing   Problem: Nutrition: Goal: Adequate nutrition will be maintained Outcome: Progressing   Problem: Coping: Goal: Level of anxiety will decrease Outcome: Progressing   Problem: Elimination: Goal: Will not experience complications related to bowel motility Outcome: Progressing Goal: Will not experience complications related to urinary retention Outcome: Progressing   Problem: Pain Managment: Goal: General experience of comfort will improve Outcome: Progressing   Problem: Safety: Goal: Ability to remain free from injury will improve Outcome: Progressing   Problem: Skin Integrity: Goal: Risk for impaired skin integrity will decrease Outcome: Progressing   Problem: Education: Goal: Ability to demonstrate management of disease process will improve Outcome: Progressing Goal: Ability to verbalize understanding of medication therapies will improve Outcome: Progressing Goal: Individualized Educational Video(s) Outcome: Progressing   Problem: Activity: Goal: Capacity to carry out activities will improve Outcome: Progressing   Problem: Cardiac: Goal: Ability to achieve and maintain adequate cardiopulmonary perfusion will improve Outcome: Progressing   

## 2020-08-22 NOTE — Progress Notes (Signed)
Pt states having difficulty breathing asking for Lasix.  Tried to explain to patient why he doesn't have Lasix ordered due to his low sodium level.  Lung sounds clean and diminished.  O2 sats 97% on room air.  M. Sharlet Salina notified, new order received for portable chest x-ray.

## 2020-08-22 NOTE — Progress Notes (Signed)
PROGRESS NOTE    Brad [name]   ZOX:[id number]  DOB: 1961-06-22  DOA: 08/20/2020     2  PCP: Charlott Rakes, MD  CC: weakness, N/V  Hospital Course: Brad [name] is a 59 y.o. 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  Patient improved with IV fluid hydration while in hospital.  Patient was subsequently discharged on 8/28. He again presented with hyponatremia, Na 116 and was started on IV fluids and admitted for further work-up and treatment.  He again admitted to ongoing alcohol consumption with approximately one 24-ounce can of beer daily.   Interval History:  Admitted with ongoing weakness and nausea at home.  He was found to be hyponatremic once again and started on gentle IV fluids.  No significant events overnight.  Still feels nauseous but no vomiting.  Appetite remains decreased.  Informed him of sodium still being low and ongoing fluids will be given while Lasix continues to be held.  Last night he was asking staff if he could have his Lasix.  He is also feeling somewhat short of breath but after further discussion, he thinks that when his pain becomes uncontrolled it makes him anxious and short of breath, not necessarily from the fluids.  Informed him that he has pain medication he can still ask for when needed.  Old records reviewed in assessment of this patient  ROS: Constitutional: positive for fatigue and malaise, Respiratory: negative for cough, Cardiovascular: negative for chest pain and Gastrointestinal: negative for abdominal pain  Assessment & Plan: Alcohol abuse - monitor on CIWA; no s/s w/d at this time - continue folate, MVI, thiamine  Acute hyponatremia - likely multifactorial in setting of ongoing beer consumption and underlying CHF -  continue NS infusion and trend BMP (currently responding) - will likely resume lasix at d/c with again instructing patient to remain abstinent   Acute on chronic combined systolic and diastolic CHF (congestive heart failure) (HCC) - holding lasix for now; will resume at d/c - monitor respiratory status while on IVF  DM type 2 (diabetes mellitus, type 2) ([place]) - continue SSI and CBGs  Essential hypertension -Continue current regimen  CVA (cerebral vascular accident) ([place]) -Continue aspirin and Lipitor  Permanent atrial fibrillation -Continue aspirin and amiodarone  HLD (hyperlipidemia) -Continue Lipitor   Antimicrobials: None  DVT prophylaxis: Lovenox Code Status: Full Family Communication: None present Disposition Plan: Status is: Inpatient  Remains inpatient appropriate because:Persistent severe electrolyte disturbances, Unsafe d/c plan, IV treatments appropriate due to intensity of illness or inability to take PO and Inpatient level of care appropriate due to severity of illness   Dispo: The patient is from: Home              Anticipated d/c is to: Home              Anticipated d/c date is: 2 days              Patient currently is not medically stable to d/c.  Objective: Blood pressure (!) 155/91, pulse 63, temperature 98 F (36.7 C), resp. rate 18, height 5\' 11"  (1.803 m), weight 80.1 kg, SpO2 98 %.  Examination: General appearance: alert, cooperative and no distress Head: Normocephalic, without obvious abnormality, atraumatic Eyes: EOMI Lungs: clear to auscultation bilaterally Heart: regular rate and rhythm and S1, S2 normal Abdomen: normal findings: bowel sounds  normal and soft, non-tender Extremities: Trace lower extremity edema Skin: mobility and turgor normal Neurologic: Grossly normal  Consultants:   None  Procedures:   None  Data Reviewed: I have personally reviewed following labs and imaging studies Results for orders placed or performed  during the hospital encounter of 08/20/20 (from the past 24 hour(s))  Glucose, capillary     Status: Abnormal   Collection Time: 08/21/20  8:36 PM  Result Value Ref Range   Glucose-Capillary 135 (H) 70 - 99 mg/dL  Basic metabolic panel     Status: Abnormal   Collection Time: 08/22/20  5:33 AM  Result Value Ref Range   Sodium 122 (L) 135 - 145 mmol/L   Potassium 4.2 3.5 - 5.1 mmol/L   Chloride 92 (L) 98 - 111 mmol/L   CO2 21 (L) 22 - 32 mmol/L   Glucose, Bld 132 (H) 70 - 99 mg/dL   BUN 14 6 - 20 mg/dL   Creatinine, Ser 1.18 0.61 - 1.24 mg/dL   Calcium 8.6 (L) 8.9 - 10.3 mg/dL   GFR calc non Af Amer >60 >60 mL/min   GFR calc Af Amer >60 >60 mL/min   Anion gap 9 5 - 15  CBC with Differential/Platelet     Status: Abnormal   Collection Time: 08/22/20  5:33 AM  Result Value Ref Range   WBC 4.6 4.0 - 10.5 K/uL   RBC 3.34 (L) 4.22 - 5.81 MIL/uL   Hemoglobin 9.9 (L) 13.0 - 17.0 g/dL   HCT 29.6 (L) 39 - 52 %   MCV 88.6 80.0 - 100.0 fL   MCH 29.6 26.0 - 34.0 pg   MCHC 33.4 30.0 - 36.0 g/dL   RDW 14.8 11.5 - 15.5 %   Platelets 157 150 - 400 K/uL   nRBC 0.0 0.0 - 0.2 %   Neutrophils Relative % 77 %   Neutro Abs 3.5 1.7 - 7.7 K/uL   Lymphocytes Relative 5 %   Lymphs Abs 0.2 (L) 0.7 - 4.0 K/uL   Monocytes Relative 16 %   Monocytes Absolute 0.7 0 - 1 K/uL   Eosinophils Relative 1 %   Eosinophils Absolute 0.1 0 - 0 K/uL   Basophils Relative 1 %   Basophils Absolute 0.0 0 - 0 K/uL   Immature Granulocytes 0 %   Abs Immature Granulocytes 0.01 0.00 - 0.07 K/uL  Magnesium     Status: None   Collection Time: 08/22/20  5:33 AM  Result Value Ref Range   Magnesium 1.9 1.7 - 2.4 mg/dL  Glucose, capillary     Status: Abnormal   Collection Time: 08/22/20  7:31 AM  Result Value Ref Range   Glucose-Capillary 148 (H) 70 - 99 mg/dL  Glucose, capillary     Status: Abnormal   Collection Time: 08/22/20 11:44 AM  Result Value Ref Range   Glucose-Capillary 133 (H) 70 - 99 mg/dL  Basic metabolic  panel     Status: Abnormal   Collection Time: 08/22/20 12:37 PM  Result Value Ref Range   Sodium 121 (L) 135 - 145 mmol/L   Potassium 4.1 3.5 - 5.1 mmol/L   Chloride 89 (L) 98 - 111 mmol/L   CO2 22 22 - 32 mmol/L   Glucose, Bld 153 (H) 70 - 99 mg/dL   BUN 13 6 - 20 mg/dL   Creatinine, Ser 1.30 (H) 0.61 - 1.24 mg/dL   Calcium 8.6 (L) 8.9 - 10.3 mg/dL   GFR calc non Af Amer 59 (  L) >60 mL/min   GFR calc Af Amer >60 >60 mL/min   Anion gap 10 5 - 15   *Note: Due to a large number of results and/or encounters for the requested time period, some results have not been displayed. A complete set of results can be found in Results Review.    Recent Results (from the past 240 hour(s))  SARS Coronavirus 2 by RT PCR (hospital order, performed in [facility] hospital lab) Nasopharyngeal Nasopharyngeal Swab     Status: None   Collection Time: 08/16/20 11:55 AM   Specimen: Nasopharyngeal Swab  Result Value Ref Range Status   SARS Coronavirus 2 NEGATIVE NEGATIVE Final    Comment: (NOTE) SARS-CoV-2 target nucleic acids are NOT DETECTED.  The SARS-CoV-2 RNA is generally detectable in upper and lower respiratory specimens during the acute phase of infection. The lowest concentration of SARS-CoV-2 viral copies this assay can detect is 250 copies / mL. A negative result does not preclude SARS-CoV-2 infection and should not be used as the sole basis for treatment or other patient management decisions.  A negative result may occur with improper specimen collection / handling, submission of specimen other than nasopharyngeal swab, presence of viral mutation(s) within the areas targeted by this assay, and inadequate number of viral copies (<250 copies / mL). A negative result must be combined with clinical observations, patient history, and epidemiological information.  Fact Sheet for Patients:   StrictlyIdeas.no  Fact Sheet for Healthcare  Providers: BankingDealers.co.za  This test is not yet approved or  cleared by the [place] FDA and has been authorized for detection and/or diagnosis of SARS-CoV-2 by FDA under an Emergency Use Authorization (EUA).  This EUA will remain in effect (meaning this test can be used) for the duration of the COVID-19 declaration under Section 564(b)(1) of the Act, 21 U.S.C. section 360bbb-3(b)(1), unless the authorization is terminated or revoked sooner.  Performed at [facility], [place] [address], [place], [place] [address]   SARS Coronavirus 2 by RT PCR (hospital order, performed in [facility] hospital lab) Nasopharyngeal Nasopharyngeal Swab     Status: None   Collection Time: 08/20/20  8:31 AM   Specimen: Nasopharyngeal Swab  Result Value Ref Range Status   SARS Coronavirus 2 NEGATIVE NEGATIVE Final    Comment: (NOTE) SARS-CoV-2 target nucleic acids are NOT DETECTED.  The SARS-CoV-2 RNA is generally detectable in upper and lower respiratory specimens during the acute phase of infection. The lowest concentration of SARS-CoV-2 viral copies this assay can detect is 250 copies / mL. A negative result does not preclude SARS-CoV-2 infection and should not be used as the sole basis for treatment or other patient management decisions.  A negative result may occur with improper specimen collection / handling, submission of specimen other than nasopharyngeal swab, presence of viral mutation(s) within the areas targeted by this assay, and inadequate number of viral copies (<250 copies / mL). A negative result must be combined with clinical observations, patient history, and epidemiological information.  Fact Sheet for Patients:   StrictlyIdeas.no  Fact Sheet for Healthcare Providers: BankingDealers.co.za  This test is not yet approved or  cleared by the [place] FDA and has been authorized for  detection and/or diagnosis of SARS-CoV-2 by FDA under an Emergency Use Authorization (EUA).  This EUA will remain in effect (meaning this test can be used) for the duration of the COVID-19 declaration under Section 564(b)(1) of the Act, 21 U.S.C. section 360bbb-3(b)(1), unless the authorization is  terminated or revoked sooner.  Performed at [facility], [place] [address] [place], [place] [address]      Radiology Studies: DG CHEST PORT 1 VIEW  Result Date: 08/22/2020 CLINICAL DATA:  Increasing shortness of breath EXAM: PORTABLE CHEST 1 VIEW COMPARISON:  Two days ago FINDINGS: Cardiomegaly and symmetric vascular prominence. No Kerley lines, effusion, or air leak. IMPRESSION: Cardiomegaly and venous congestion without progression from 2 days ago. Electronically Signed   By: Monte Fantasia M.D.   On: 08/22/2020 05:15   DG CHEST PORT 1 VIEW  Final Result    DG Chest Port 1 View  Final Result      Scheduled Meds: . acetaminophen  650 mg Oral QID  . amiodarone  200 mg Oral Daily  . aspirin EC  81 mg Oral Daily  . atorvastatin  20 mg Oral q1800  . digoxin  0.125 mg Oral Daily  . enoxaparin (LOVENOX) injection  40 mg Subcutaneous Q24H  . folic acid  1 mg Oral Daily  . gabapentin  600 mg Oral BID  . insulin aspart  0-15 Units Subcutaneous TID WC  . insulin aspart  0-5 Units Subcutaneous QHS  . isosorbide-hydrALAZINE  1 tablet Oral TID  . multivitamin with minerals  1 tablet Oral Daily  . thiamine  100 mg Oral Daily   Or  . thiamine  100 mg Intravenous Daily   PRN Meds: ondansetron (ZOFRAN) IV, oxyCODONE Continuous Infusions: . sodium chloride 50 mL/hr at 08/22/20 0522      LOS: 2 days  Time spent: Greater than 50% of the 35 minute visit was spent in counseling/coordination of care for the patient as laid out in the A&P.   Dwyane Dee, MD [facility] 08/22/2020, 4:51 PM  Contact via secure chat.  To contact the attending provider between 7A-7P or  the covering provider during after hours 7P-7A, please log into the web site [email or web address] and access using universal [place] password for that web site. If you do not have the password, please call the hospital operator.

## 2020-08-23 ENCOUNTER — Telehealth: Payer: Self-pay | Admitting: Family Medicine

## 2020-08-23 ENCOUNTER — Telehealth (HOSPITAL_COMMUNITY): Payer: Self-pay

## 2020-08-23 DIAGNOSIS — S62509A Fracture of unspecified phalanx of unspecified thumb, initial encounter for closed fracture: Secondary | ICD-10-CM

## 2020-08-23 DIAGNOSIS — N182 Chronic kidney disease, stage 2 (mild): Secondary | ICD-10-CM

## 2020-08-23 LAB — BASIC METABOLIC PANEL
Anion gap: 8 (ref 5–15)
BUN: 14 mg/dL (ref 6–20)
CO2: 21 mmol/L — ABNORMAL LOW (ref 22–32)
Calcium: 8.4 mg/dL — ABNORMAL LOW (ref 8.9–10.3)
Chloride: 96 mmol/L — ABNORMAL LOW (ref 98–111)
Creatinine, Ser: 1.25 mg/dL — ABNORMAL HIGH (ref 0.61–1.24)
GFR calc Af Amer: >60 mL/min (ref >60)
GFR calc non Af Amer: >60 mL/min (ref >60)
Glucose, Bld: 155 mg/dL — ABNORMAL HIGH (ref 70–99)
Potassium: 4.3 mmol/L (ref 3.5–5.1)
Sodium: 125 mmol/L — ABNORMAL LOW (ref 135–145)

## 2020-08-23 LAB — CBC WITH DIFFERENTIAL/PLATELET
Abs Immature Granulocytes: 0.01 10*3/uL (ref 0.00–0.07)
Basophils Absolute: 0 10*3/uL (ref 0.0–0.1)
Basophils Relative: 1 %
Eosinophils Absolute: 0.1 10*3/uL (ref 0.0–0.5)
Eosinophils Relative: 1 %
HCT: 29.7 % — ABNORMAL LOW (ref 39.0–52.0)
Hemoglobin: 9.7 g/dL — ABNORMAL LOW (ref 13.0–17.0)
Immature Granulocytes: 0 %
Lymphocytes Relative: 6 %
Lymphs Abs: 0.3 10*3/uL — ABNORMAL LOW (ref 0.7–4.0)
MCH: 29.8 pg (ref 26.0–34.0)
MCHC: 32.7 g/dL (ref 30.0–36.0)
MCV: 91.1 fL (ref 80.0–100.0)
Monocytes Absolute: 0.8 10*3/uL (ref 0.1–1.0)
Monocytes Relative: 18 %
Neutro Abs: 3.3 10*3/uL (ref 1.7–7.7)
Neutrophils Relative %: 74 %
Platelets: 134 10*3/uL — ABNORMAL LOW (ref 150–400)
RBC: 3.26 MIL/uL — ABNORMAL LOW (ref 4.22–5.81)
RDW: 15.1 % (ref 11.5–15.5)
WBC: 4.5 10*3/uL (ref 4.0–10.5)
nRBC: 0 % (ref 0.0–0.2)

## 2020-08-23 LAB — MAGNESIUM: Magnesium: 1.8 mg/dL (ref 1.7–2.4)

## 2020-08-23 LAB — GLUCOSE, CAPILLARY
Glucose-Capillary: 131 mg/dL — ABNORMAL HIGH (ref 70–99)
Glucose-Capillary: 169 mg/dL — ABNORMAL HIGH (ref 70–99)

## 2020-08-23 IMAGING — CR CHEST - 2 VIEW
2 series · 2 of 2 positions shown · non-contrast
Comparison: Most recent radiograph 08/10/2019, multiple priors.
Most recent CT 05/27/2019

CLINICAL DATA: Chest pain.

EXAM:
CHEST - 2 VIEW

[chest pa]
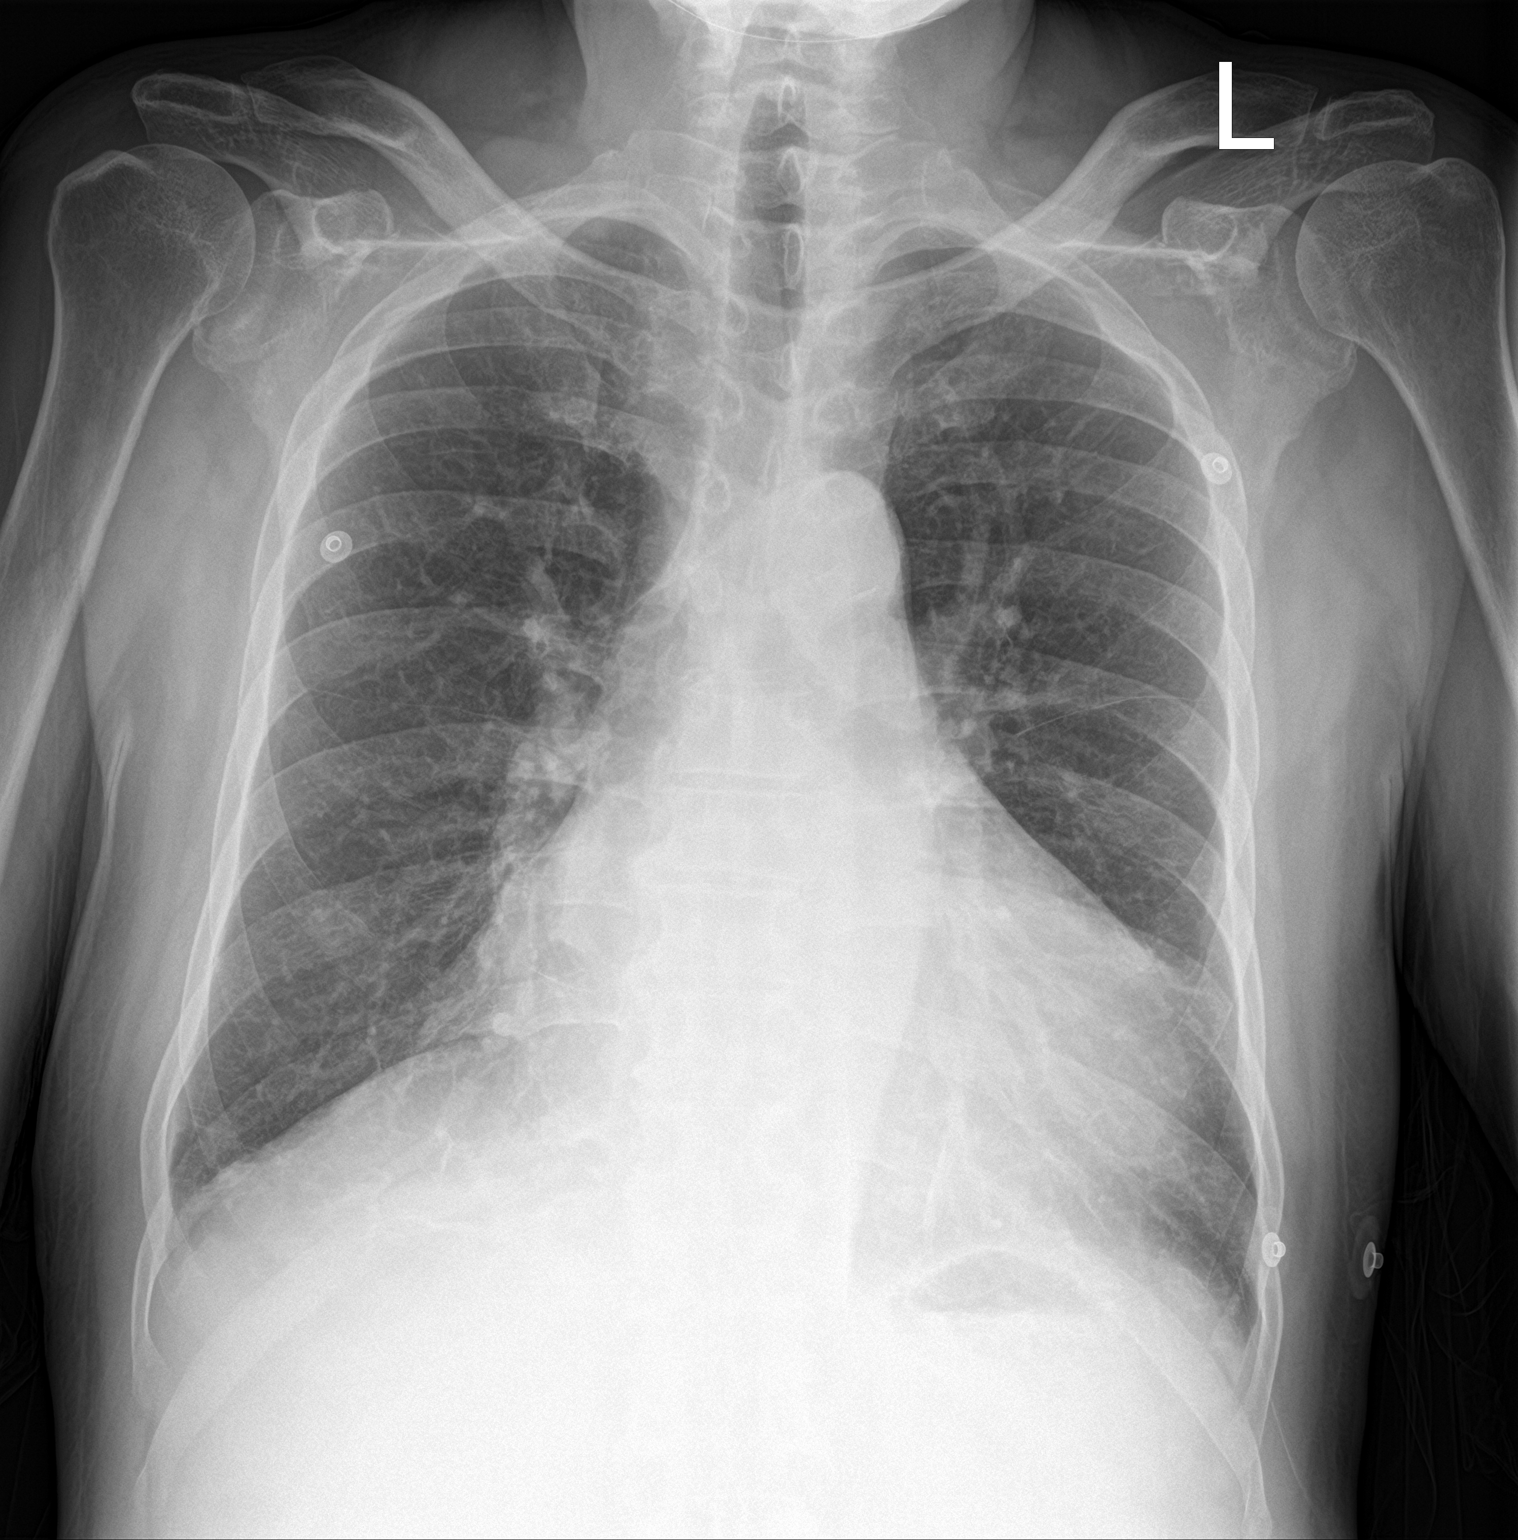

[chest lat]
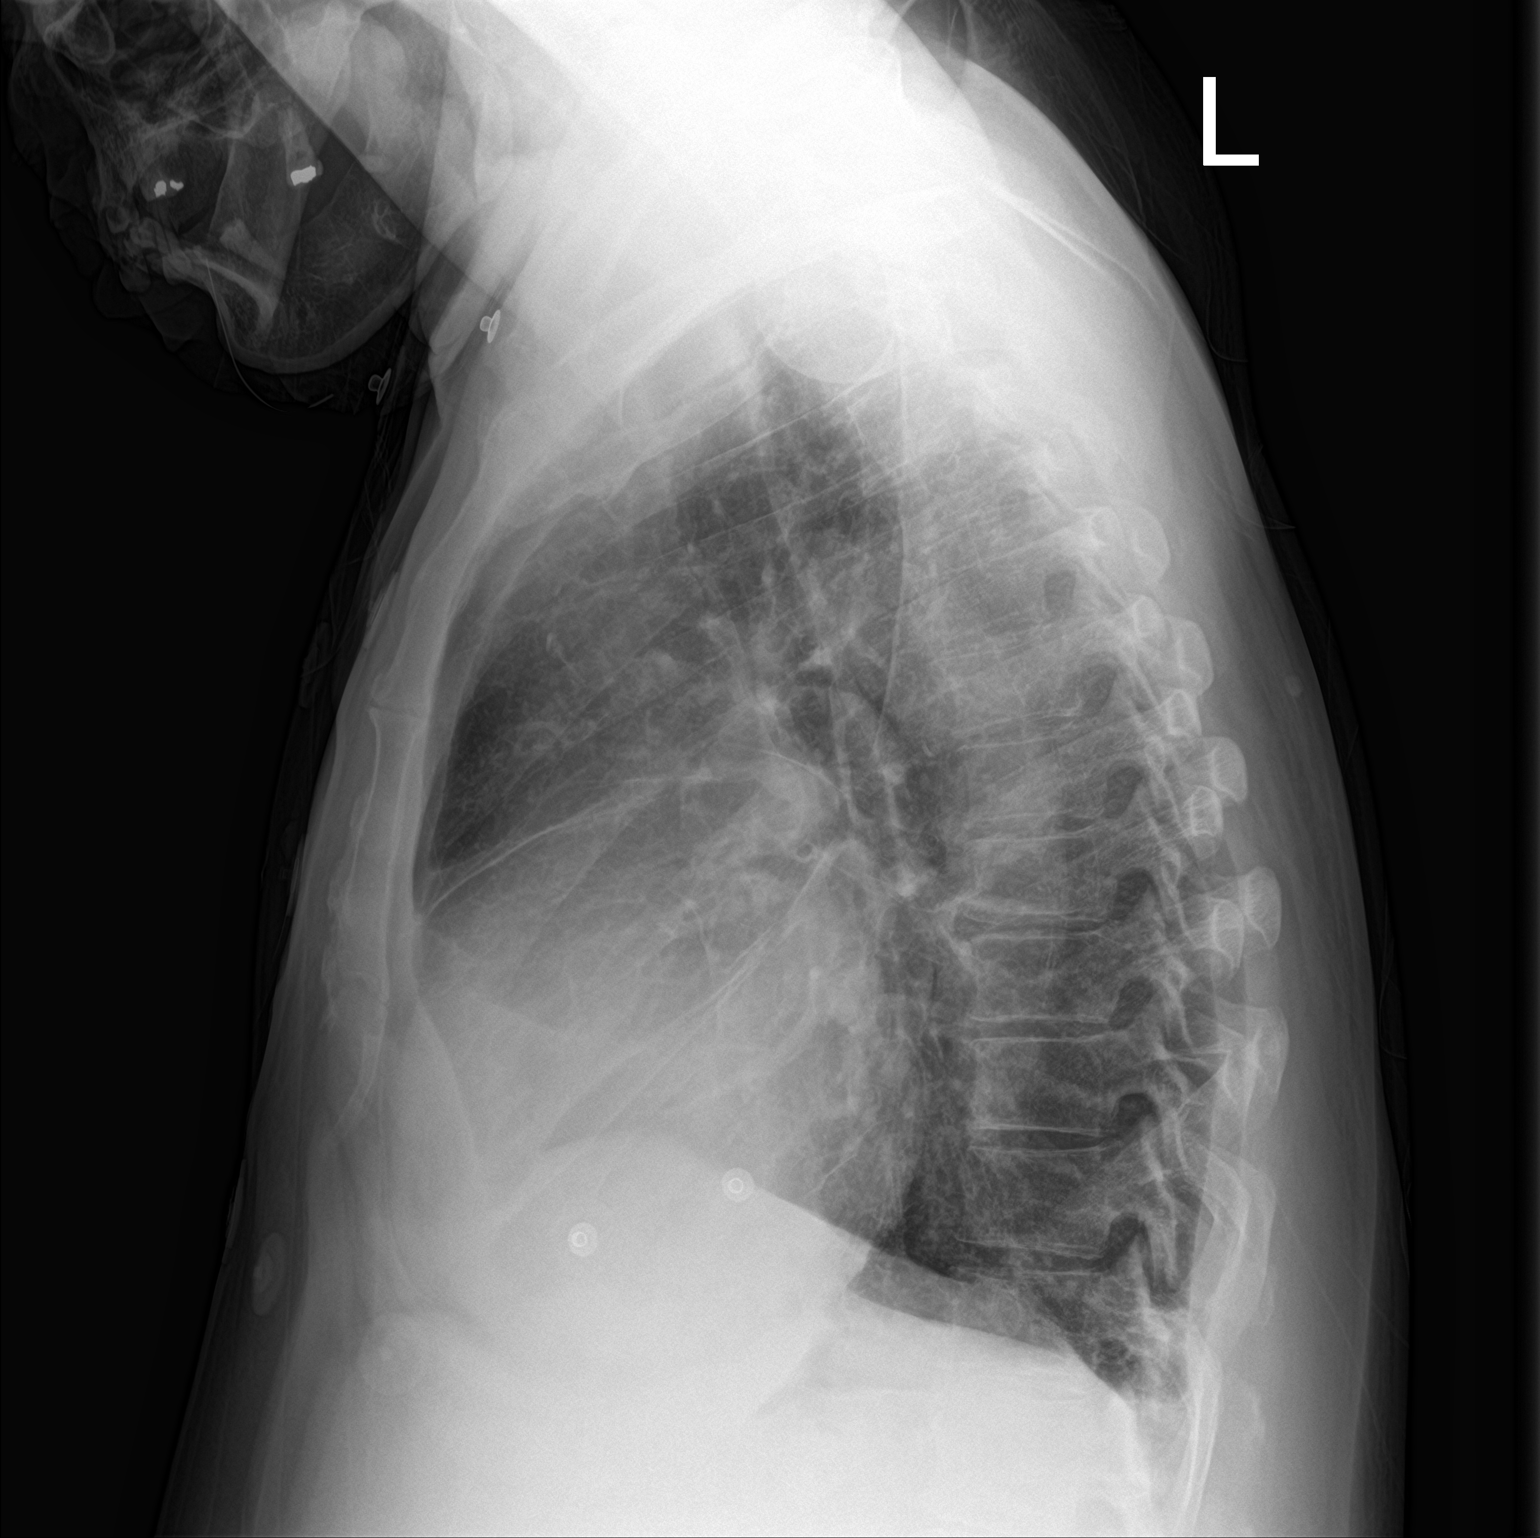

[2 of 2 positions shown; findings below may reference images not displayed]

FINDINGS: Chronic cardiomegaly with unchanged mediastinal contours. Trace
bilateral pleural effusions, minimal fluid in the fissures. Minimal
pulmonary edema with Kerley B-lines. No confluent airspace disease
or pneumothorax. No acute osseous abnormalities.
IMPRESSION: Minimal pulmonary edema with trace bilateral pleural effusions.
Stable cardiomegaly.

## 2020-08-23 MED ORDER — POLYETHYLENE GLYCOL 3350 17 G PO PACK: 17.0000 g | PACK | Freq: Every day | ORAL | Status: DC

## 2020-08-23 NOTE — Assessment & Plan Note (Signed)
-   splint/cast continued inpatient - patient knows to contact Dr. Fredna Dow at discharge; information again included in d/c instructions  - PDMP notes multiple opoid scripts this month; hold off on further opioids; tylenol was recommended at discharge

## 2020-08-23 NOTE — Discharge Summary (Signed)
Physician Discharge Summary  Brad [name] ZOX:[id number] DOB: 07/29/1961 DOA: 08/20/2020  PCP: Charlott Rakes, MD  Admit date: 08/20/2020 Discharge date: 08/23/2020  Admitted From: home Disposition:  home Discharging physician: Dwyane Dee, MD  Recommendations for Outpatient Follow-up:  1. Follow up with Dr. Fredna Dow   Patient discharged to home in Discharge Condition: stable CODE STATUS: Full Diet recommendation:  Diet Orders (From admission, onward)    Start     Ordered   08/23/20 0000  Diet - low sodium heart healthy        08/23/20 1336   08/20/20 1202  Diet heart healthy/carb modified Room service appropriate? Yes; Fluid consistency: Thin  Diet effective now       Question Answer Comment  Diet-HS Snack? Nothing   Room service appropriate? Yes   Fluid consistency: Thin      08/20/20 1201          Hospital Course: Brad [name] is a 59 y.o. 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  Patient improved with IV fluid hydration while in hospital.  Patient was subsequently discharged on 8/28. He again presented with hyponatremia, Na 116 and was started on IV fluids and admitted for further work-up and treatment.  He again admitted to ongoing alcohol consumption with approximately one 24-ounce can of beer daily.  His torsemide was held on admission and he was started on fluids as previously noted.  Sodium levels slowly up trended and his symptoms improved substantially.  He was able to tolerate a diet fairly well prior to discharge. He was resumed on his home medications at time of discharge. He was also recommended to continue abstaining from further excessive alcohol use which he was told was contributing to his hyponatremia.  Also, he will follow up  outpatient with hand surgeon, Dr. Leanora Cover, regarding his right thumb fracture.  He was again given contact information for calling to establish follow-up visit.  After prior note review, he has failed outpatient opioid pain medications with multiple prescriptions noted over the past month.  He was instructed to continue Tylenol at discharge and was not given any further opioid prescriptions.   Alcohol abuse - monitor on CIWA; no s/s w/d at this time - continue folate, MVI, thiamine  Acute hyponatremia - likely multifactorial in setting of ongoing beer consumption and underlying CHF - continue NS infusion and trend BMP (currently responding) - home meds resumed at discharge   Acute on chronic combined systolic and diastolic CHF (congestive heart failure) (HCC) - holding diuretics for now; will resume at d/c - monitor respiratory status while on IVF  DM type 2 (diabetes mellitus, type 2) (HCC) - continue SSI and CBGs  Essential hypertension -Continue current regimen  CVA (cerebral vascular accident) ([place]) -Continue aspirin and Lipitor  Permanent atrial fibrillation -Continue aspirin and amiodarone  HLD (hyperlipidemia) -Continue Lipitor  CKD (chronic kidney disease), stage II - baseline creatinine appears to be 1.1-1.2 , he has history of fluctuation with acute injuries superimposed; given underlying history of DMII and HTN with microalbuminuria (100), he likely has component of underlying CKD - continue to monitor for further progression; no need for further intervention as of yet - would benefit from ACEi however has anaphylaxis listed for allergies so will have to hold off on ACEi or ARB at this time - continue asa and statin  Thumb fracture - splint/cast continued inpatient - patient knows to contact Dr. Fredna Dow at discharge; information again included in d/c instructions  - PDMP notes multiple opoid scripts this month; hold off on further opioids; tylenol was recommended  at discharge     The patient's chronic medical conditions were treated accordingly per the patient's home medication regimen except as noted.  On day of discharge, patient was felt deemed stable for discharge. Patient/family member advised to call PCP or come back to ER if needed.   Discharge Diagnoses:   Principal Diagnosis: Acute on chronic combined systolic and diastolic CHF (congestive heart failure) [facility])  Active Hospital Problems   Diagnosis Date Noted  . Acute on chronic combined systolic and diastolic CHF (congestive heart failure) ([place]) 07/09/2017    Priority: Medium  . Alcohol abuse 02/21/2019    Priority: High  . Acute hyponatremia 05/27/2016    Priority: High  . CKD (chronic kidney disease), stage II 08/23/2020    Priority: Medium  . Thumb fracture 08/23/2020  . HLD (hyperlipidemia) 03/23/2020  . Frequent falls 01/17/2019  . Permanent atrial fibrillation   . CVA (cerebral vascular accident) ([place]) 09/18/2016  . DM type 2 (diabetes mellitus, type 2) ([place]) 07/01/2016  . Essential hypertension 07/01/2016    Resolved Hospital Problems  No resolved problems to display.    Discharge Instructions    Diet - low sodium heart healthy   Complete by: As directed    Increase activity slowly   Complete by: As directed    No wound care   Complete by: As directed      Allergies as of 08/23/2020      Reactions   Angiotensin Receptor Blockers Anaphylaxis, Other (See Comments)   (Angioedema also with Lisinopril, therefore ARB's are contraindicated)   Lisinopril Anaphylaxis, Swelling   Throat swelling   Pamelor [nortriptyline Hcl] Anaphylaxis, Swelling   Throat swells      Medication List    STOP taking these medications   ibuprofen 200 MG tablet Commonly known as: ADVIL     TAKE these medications   amiodarone 200 MG tablet Commonly known as: PACERONE Take 1 tablet (200 mg total) by mouth daily.   aspirin EC 81 MG tablet Take 1 tablet (81 mg total) by mouth daily.  Swallow whole.   atorvastatin 20 MG tablet Commonly known as: LIPITOR Take 1 tablet (20 mg total) by mouth daily at 6 PM.   dapagliflozin propanediol 5 MG Tabs tablet Commonly known as: Farxiga Take 1 tablet (5 mg total) by mouth daily before breakfast.   digoxin 0.125 MG tablet Commonly known as: LANOXIN Take 1 tablet (0.125 mg total) by mouth daily.   folic acid 1 MG tablet Commonly known as: FOLVITE Take 1 tablet (1 mg total) by mouth daily.   gabapentin 300 MG capsule Commonly known as: NEURONTIN Take 2 capsules (600 mg total) by mouth 2 (two) times daily.   isosorbide-hydrALAZINE 20-37.5 MG tablet Commonly known as: BIDIL Take 1 tablet by mouth 3 (three) times daily.   multivitamin with minerals Tabs tablet Take 1 tablet by mouth daily.   potassium chloride SA 20 MEQ tablet Commonly known as: KLOR-CON Take 20 mEq by mouth daily.   spironolactone 25 MG tablet Commonly known as: ALDACTONE Take 0.5 tablets (12.5 mg total) by mouth daily.   thiamine 100 MG tablet Commonly known as: Vitamin B-1 Take 100 mg by mouth daily.   torsemide 20 MG tablet Commonly known as: DEMADEX Take 2 tablets (40  mg total) by mouth daily.       Follow-up Information    Charlott Rakes, MD. Schedule an appointment as soon as possible for a visit in 1 week(s).   Specialty: Family Medicine Contact information: [place] [address] [phone]        [name], MD .   Specialty: Cardiology Contact information: [place] [address] [phone]        Leanora Cover, MD. Schedule an appointment as soon as possible for a visit.   Specialty: Orthopedic Surgery Contact information: [address] [place] [address] [phone]              Allergies  Allergen Reactions  . Angiotensin Receptor Blockers Anaphylaxis and Other (See Comments)    (Angioedema also with Lisinopril, therefore ARB's are  contraindicated)  . Lisinopril Anaphylaxis and Swelling    Throat swelling  . Pamelor [Nortriptyline Hcl] Anaphylaxis and Swelling    Throat swells    Consultations: none  Discharge Exam: BP 135/83 (BP Location: Left Arm)   Pulse 65   Temp 97.7 F (36.5 C) (Oral)   Resp 18   Ht 5\' 11"  (1.803 m)   Wt 80.9 kg   SpO2 98%   BMI 24.88 kg/m  General appearance: alert, cooperative and no distress Head: Normocephalic, without obvious abnormality, atraumatic Eyes: EOMI Lungs: clear to auscultation bilaterally Heart: regular rate and rhythm and S1, S2 normal Abdomen: normal findings: bowel sounds normal and soft, non-tender Extremities: Trace lower extremity edema; right thumb noted in cast/splint with soft surrounding compartments Skin: mobility and turgor normal Neurologic: Grossly normal  The results of significant diagnostics from this hospitalization (including imaging, microbiology, ancillary and laboratory) are listed below for reference.   Microbiology: Recent Results (from the past 240 hour(s))  SARS Coronavirus 2 by RT PCR (hospital order, performed in [facility] hospital lab) Nasopharyngeal Nasopharyngeal Swab     Status: None   Collection Time: 08/16/20 11:55 AM   Specimen: Nasopharyngeal Swab  Result Value Ref Range Status   SARS Coronavirus 2 NEGATIVE NEGATIVE Final    Comment: (NOTE) SARS-CoV-2 target nucleic acids are NOT DETECTED.  The SARS-CoV-2 RNA is generally detectable in upper and lower respiratory specimens during the acute phase of infection. The lowest concentration of SARS-CoV-2 viral copies this assay can detect is 250 copies / mL. A negative result does not preclude SARS-CoV-2 infection and should not be used as the sole basis for treatment or other patient management decisions.  A negative result may occur with improper specimen collection / handling, submission of specimen other than nasopharyngeal swab, presence of viral mutation(s) within  the areas targeted by this assay, and inadequate number of viral copies (<250 copies / mL). A negative result must be combined with clinical observations, patient history, and epidemiological information.  Fact Sheet for Patients:   StrictlyIdeas.no  Fact Sheet for Healthcare Providers: BankingDealers.co.za  This test is not yet approved or  cleared by the [place] FDA and has been authorized for detection and/or diagnosis of SARS-CoV-2 by FDA under an Emergency Use Authorization (EUA).  This EUA will remain in effect (meaning this test can be used) for the duration of the COVID-19 declaration under Section 564(b)(1) of the Act, 21 U.S.C. section 360bbb-3(b)(1), unless the authorization is terminated or revoked sooner.  Performed at [facility], [place] [address], [place], [place] [address]   SARS Coronavirus 2 by RT PCR (hospital order, performed  in [place] lab) Nasopharyngeal Nasopharyngeal Swab     Status: None   Collection Time: 08/20/20  8:31 AM   Specimen: Nasopharyngeal Swab  Result Value Ref Range Status   SARS Coronavirus 2 NEGATIVE NEGATIVE Final    Comment: (NOTE) SARS-CoV-2 target nucleic acids are NOT DETECTED.  The SARS-CoV-2 RNA is generally detectable in upper and lower respiratory specimens during the acute phase of infection. The lowest concentration of SARS-CoV-2 viral copies this assay can detect is 250 copies / mL. A negative result does not preclude SARS-CoV-2 infection and should not be used as the sole basis for treatment or other patient management decisions.  A negative result may occur with improper specimen collection / handling, submission of specimen other than nasopharyngeal swab, presence of viral mutation(s) within the areas targeted by this assay, and inadequate number of viral copies (<250 copies / mL). A negative result must be combined with  clinical observations, patient history, and epidemiological information.  Fact Sheet for Patients:   StrictlyIdeas.no  Fact Sheet for Healthcare Providers: BankingDealers.co.za  This test is not yet approved or  cleared by the [place] FDA and has been authorized for detection and/or diagnosis of SARS-CoV-2 by FDA under an Emergency Use Authorization (EUA).  This EUA will remain in effect (meaning this test can be used) for the duration of the COVID-19 declaration under Section 564(b)(1) of the Act, 21 U.S.C. section 360bbb-3(b)(1), unless the authorization is terminated or revoked sooner.  Performed at [facility], [place] [address] [place], [place] [address]      Labs: BNP (last 3 results) Recent Labs    07/31/20 2123 08/16/20 1122 08/20/20 0852  BNP 1,681.4* 1,319.4* 621.3*   Basic Metabolic Panel: Recent Labs  Lab 08/20/20 1302 08/20/20 1559 08/21/20 0543 08/21/20 1153 08/22/20 0533 08/22/20 1237 08/23/20 0549  NA 117*   < > 120* 122* 122* 121* 125*  K 4.9   < > 4.2 4.3 4.2 4.1 4.3  CL 89*   < > 87* 91* 92* 89* 96*  CO2 19*   < > 22 22 21* 22 21*  GLUCOSE 125*   < > 114* 106* 132* 153* 155*  BUN 11   < > 14 15 14 13 14   CREATININE 1.08   < > 1.13 1.18 1.18 1.30* 1.25*  CALCIUM 8.3*   < > 8.8* 8.5* 8.6* 8.6* 8.4*  MG 1.7  --   --   --  1.9  --  1.8  PHOS 3.5  --   --   --   --   --   --    < > = values in this interval not displayed.   Liver Function Tests: Recent Labs  Lab 08/17/20 0551 08/20/20 1302  AST 19 19  ALT 14 15  ALKPHOS 66 64  BILITOT 0.4 0.6  PROT 6.9 7.5  ALBUMIN 3.1* 3.4*   No results for input(s): LIPASE, AMYLASE in the last 168 hours. No results for input(s): AMMONIA in the last 168 hours. CBC: Recent Labs  Lab 08/17/20 0551 08/20/20 0853 08/20/20 1302 08/22/20 0533 08/23/20 0549  WBC 4.5 3.9* 4.2 4.6 4.5  NEUTROABS  --  3.0  --  3.5 3.3  HGB 11.0*  11.0* 11.1* 9.9* 9.7*  HCT 32.6* 31.6* 32.4* 29.6* 29.7*  MCV 88.8 87.3 88.0 88.6 91.1  PLT 242 228 219 157 134*   Cardiac Enzymes: No results for input(s): CKTOTAL, CKMB, CKMBINDEX, TROPONINI in the last 168 hours.  BNP: Invalid input(s): POCBNP CBG: Recent Labs  Lab 08/22/20 1144 08/22/20 1709 08/22/20 2016 08/23/20 0759 08/23/20 1126  GLUCAP 133* 185* 129* 131* 169*   D-Dimer No results for input(s): DDIMER in the last 72 hours. Hgb A1c No results for input(s): HGBA1C in the last 72 hours. Lipid Profile No results for input(s): CHOL, HDL, LDLCALC, TRIG, CHOLHDL, LDLDIRECT in the last 72 hours. Thyroid function studies No results for input(s): TSH, T4TOTAL, T3FREE, THYROIDAB in the last 72 hours.  Invalid input(s): FREET3 Anemia work up No results for input(s): VITAMINB12, FOLATE, FERRITIN, TIBC, IRON, RETICCTPCT in the last 72 hours. Urinalysis    Component Value Date/Time   COLORURINE YELLOW 08/16/2020 1155   APPEARANCEUR CLEAR 08/16/2020 1155   LABSPEC 1.005 08/16/2020 1155   PHURINE 5.0 08/16/2020 1155   GLUCOSEU NEGATIVE 08/16/2020 1155   HGBUR SMALL (A) 08/16/2020 1155   BILIRUBINUR NEGATIVE 08/16/2020 1155   BILIRUBINUR neg 05/08/2017 1503   KETONESUR NEGATIVE 08/16/2020 1155   PROTEINUR 100 (A) 08/16/2020 1155   UROBILINOGEN 0.2 05/08/2017 1503   UROBILINOGEN 1.0 04/26/2012 1328   NITRITE NEGATIVE 08/16/2020 1155   LEUKOCYTESUR NEGATIVE 08/16/2020 1155   Sepsis Labs Invalid input(s): PROCALCITONIN,  WBC,  LACTICIDVEN Microbiology Recent Results (from the past 240 hour(s))  SARS Coronavirus 2 by RT PCR (hospital order, performed in [facility] hospital lab) Nasopharyngeal Nasopharyngeal Swab     Status: None   Collection Time: 08/16/20 11:55 AM   Specimen: Nasopharyngeal Swab  Result Value Ref Range Status   SARS Coronavirus 2 NEGATIVE NEGATIVE Final    Comment: (NOTE) SARS-CoV-2 target nucleic acids are NOT DETECTED.  The SARS-CoV-2 RNA is  generally detectable in upper and lower respiratory specimens during the acute phase of infection. The lowest concentration of SARS-CoV-2 viral copies this assay can detect is 250 copies / mL. A negative result does not preclude SARS-CoV-2 infection and should not be used as the sole basis for treatment or other patient management decisions.  A negative result may occur with improper specimen collection / handling, submission of specimen other than nasopharyngeal swab, presence of viral mutation(s) within the areas targeted by this assay, and inadequate number of viral copies (<250 copies / mL). A negative result must be combined with clinical observations, patient history, and epidemiological information.  Fact Sheet for Patients:   StrictlyIdeas.no  Fact Sheet for Healthcare Providers: BankingDealers.co.za  This test is not yet approved or  cleared by the [place] FDA and has been authorized for detection and/or diagnosis of SARS-CoV-2 by FDA under an Emergency Use Authorization (EUA).  This EUA will remain in effect (meaning this test can be used) for the duration of the COVID-19 declaration under Section 564(b)(1) of the Act, 21 U.S.C. section 360bbb-3(b)(1), unless the authorization is terminated or revoked sooner.  Performed at [facility], [place] [address] [place], [place] [address]   SARS Coronavirus 2 by RT PCR (hospital order, performed in [facility] hospital lab) Nasopharyngeal Nasopharyngeal Swab     Status: None   Collection Time: 08/20/20  8:31 AM   Specimen: Nasopharyngeal Swab  Result Value Ref Range Status   SARS Coronavirus 2 NEGATIVE NEGATIVE Final    Comment: (NOTE) SARS-CoV-2 target nucleic acids are NOT DETECTED.  The SARS-CoV-2 RNA is generally detectable in upper and lower respiratory specimens during the acute phase of infection. The lowest concentration of SARS-CoV-2 viral copies  this assay can detect is 250 copies / mL. A negative result does not preclude  SARS-CoV-2 infection and should not be used as the sole basis for treatment or other patient management decisions.  A negative result may occur with improper specimen collection / handling, submission of specimen other than nasopharyngeal swab, presence of viral mutation(s) within the areas targeted by this assay, and inadequate number of viral copies (<250 copies / mL). A negative result must be combined with clinical observations, patient history, and epidemiological information.  Fact Sheet for Patients:   StrictlyIdeas.no  Fact Sheet for Healthcare Providers: BankingDealers.co.za  This test is not yet approved or  cleared by the [place] FDA and has been authorized for detection and/or diagnosis of SARS-CoV-2 by FDA under an Emergency Use Authorization (EUA).  This EUA will remain in effect (meaning this test can be used) for the duration of the COVID-19 declaration under Section 564(b)(1) of the Act, 21 U.S.C. section 360bbb-3(b)(1), unless the authorization is terminated or revoked sooner.  Performed at [facility], [place] [address], [place], [place] [address]     Procedures/Studies: DG Chest 2 View  Result Date: 07/31/2020 CLINICAL DATA:  Chest pain. Patient was seen in the emergency department yesterday for neck pain and release. EXAM: CHEST - 2 VIEW COMPARISON:  Chest x-ray 06/30/2020 FINDINGS: Heart is enlarged. Mild pulmonary vascular congestion is present. Mild bibasilar opacities are present bilaterally without significant consolidation otherwise. Surgical clips are present in the right neck. IMPRESSION: 1. Cardiomegaly and mild pulmonary vascular congestion. 2. Mild bibasilar airspace disease likely reflects atelectasis. Electronically Signed   By: San Morelle M.D.   On: 07/31/2020 04:41   DG Cervical Spine  Complete  Result Date: 07/30/2020 CLINICAL DATA:  Right-sided neck pain and right arm pain. History of recent cervical fusion procedure. EXAM: CERVICAL SPINE - COMPLETE 4+ VIEW COMPARISON:  Postoperative films 07/24/2020 FINDINGS: Moderate prevertebral soft tissue swelling, likely postoperative hematoma. Anterior and interbody fusion hardware at C4-5 and C5-6 is in good position, unchanged. No complicating features. Normal alignment. No acute bony findings. The facets are normally aligned. The bony neural foramen are fairly well maintained. Mild uniform narrowing at C5-6 is noted bilaterally. The lung apices are grossly clear. IMPRESSION: 1. Anterior and interbody fusion hardware at C4-5 and V7-8 without complicating features. 2. Moderate prevertebral soft tissue swelling, likely postoperative hematoma. 3. No acute bony findings. Electronically Signed   By: Marijo Sanes M.D.   On: 07/30/2020 07:16   CT Soft Tissue Neck Wo Contrast  Result Date: 07/30/2020 CLINICAL DATA:  Neck mass; additional history right neck pain, recent spinal surgery EXAM: CT NECK WITHOUT CONTRAST TECHNIQUE: Multidetector CT imaging of the neck was performed following the standard protocol without intravenous contrast. COMPARISON:  None. FINDINGS: Pharynx and larynx: There is prevertebral/retropharyngeal soft tissue swelling and edema. Pharynx is unremarkable. Larynx is unremarkable. Airway is patent. Salivary glands: Unremarkable. Thyroid: Normal Lymph nodes: No enlarged lymph nodes identified. Vascular: Calcified plaque at the common carotid bifurcations. Limited intracranial: Unremarkable. Visualized orbits: Unremarkable. Mastoids and visualized paranasal sinuses: Minor mucosal thickening. Visualized mastoids are clear. Skeleton: Postoperative changes of anterior fusion at C4-C6 with plate and screw fixation and interbody allografts. Upper chest: Partially imaged bilateral pleural effusions. Patchy ground-glass density in the left upper  lobe. Other: Postoperative changes with some residual subcutaneous edema and air. No collection. IMPRESSION: Persistent postoperative prevertebral/retropharyngeal soft tissue swelling and edema. Postoperative changes in the right neck without fluid collection. Partially imaged bilateral pleural effusions. Patchy ground-glass opacities in the left upper lobe may reflect edema or infectious/inflammatory process.  Electronically Signed   By: Macy Mis M.D.   On: 07/30/2020 10:22   CT Cervical Spine Wo Contrast  Result Date: 08/16/2020 CLINICAL DATA:  Pain following recent fall EXAM: CT CERVICAL SPINE WITHOUT CONTRAST TECHNIQUE: Multidetector CT imaging of the cervical spine was performed without intravenous contrast. Multiplanar CT image reconstructions were also generated. COMPARISON:  May 03, 2020 FINDINGS: Alignment: There is no appreciable spondylolisthesis. Skull base and vertebrae: Skull base and craniocervical junction regions appear normal. The patient is status post anterior screw and plate fixation from D9-M4 with disc spacers present at C4-5 and C5-6. Support hardware intact. No acute fracture. No blastic or lytic bone lesions. Soft tissues and spinal canal: The prevertebral soft tissues and predental space regions are normal. There is no appreciable cord or canal hematoma. No paraspinous lesions are evident. Disc levels: The disc spacers appear intact with disc spacers present at C4-5 and C5-6. There is there is diffuse disc protrusion at C4-5 with moderate spinal stenosis at this level. There is milder spinal stenosis at C5-6. There is severe exit foraminal narrowing bilaterally with impression on the exiting nerve roots bilaterally at C5-6. There is exit foraminal narrowing due to asymmetric disc protrusion at C6-7 on the left. Upper chest: Visualized upper lung regions are clear. Other: There is calcification in each proximal subclavian artery as well as in each carotid artery. IMPRESSION: 1.  No  acute fracture or spondylolisthesis. 2. Status post anterior fusion from C4-C6 with disc spacers at C4-5 and C5-6. Support hardware intact. 3. Moderate spinal stenosis due to disc protrusion and bony hypertrophy at C4-5. Mild spinal stenosis at C5-6 with severe exit foraminal narrowing bilaterally due to bony hypertrophy. 4. Foci of calcification in each proximal subclavian and mid carotid artery region. Electronically Signed   By: Lowella Grip III M.D.   On: 08/16/2020 11:21   DG CHEST PORT 1 VIEW  Result Date: 08/22/2020 CLINICAL DATA:  Increasing shortness of breath EXAM: PORTABLE CHEST 1 VIEW COMPARISON:  Two days ago FINDINGS: Cardiomegaly and symmetric vascular prominence. No Kerley lines, effusion, or air leak. IMPRESSION: Cardiomegaly and venous congestion without progression from 2 days ago. Electronically Signed   By: Monte Fantasia M.D.   On: 08/22/2020 05:15   DG Chest Port 1 View  Result Date: 08/20/2020 CLINICAL DATA:  Cough, shortness of breath, and mid chest pain this morning; history atrial fibrillation/flutter, CHF, cirrhosis, diabetes mellitus, ethanol abuse, hypertension, non ischemic cardiomyopathy, smoker, polysubstance abuse EXAM: PORTABLE CHEST 1 VIEW COMPARISON:  Portable exam 0904 hours compared to 07/31/2020 FINDINGS: Enlargement of cardiac silhouette with pulmonary vascular congestion. Mediastinal contours normal for slight degree of rotation to the LEFT. Linear subsegmental atelectasis LEFT mid lung. Hazy infiltrates in the lungs bilaterally, favor mild pulmonary edema though infection not entirely exclude. No pleural effusion or pneumothorax. Prior cervical spine fusion. IMPRESSION: Enlargement of cardiac silhouette with vascular congestion and probable mild pulmonary edema as above. Linear subsegmental atelectasis LEFT upper lobe. Electronically Signed   By: Lavonia Dana M.D.   On: 08/20/2020 09:17   DG Finger Thumb Right  Result Date: 08/16/2020 CLINICAL DATA:  Injury  2 thumb, status post fall 2 days ago. EXAM: RIGHT THUMB 2+V COMPARISON:  None FINDINGS: Comminuted fracture with mild displacement of the distal phalanx of the RIGHT thumb. Soft tissue swelling overlying the RIGHT thumb. Comminuted T-shaped fracture at the base of the proximal phalanx. Some impaction of the fracture and evidence of intra-articular extension. Mild anterior displacement of the distal  aspect of the proximal phalanx IMPRESSION: Comminuted fractures of the distal and proximal phalanges of the RIGHT thumb, as described above. Intra-articular extension noted about the proximal phalangeal fracture at the base of the proximal phalanx. Comminuted fracture at the distal phalanx underlies the nail bed. Correlate with any signs that would indicate the possibility "open fracture" with nail bed injury. Electronically Signed   By: Zetta Bills M.D.   On: 08/16/2020 08:22   ECHOCARDIOGRAM COMPLETE  Result Date: 08/01/2020    ECHOCARDIOGRAM REPORT   Patient Name:   WASIL WOLKE Date of Exam: 08/01/2020 Medical Rec #:  [id number]       Height:       71.0 in Accession #:    [id number]      Weight:       198.4 lb Date of Birth:  11/20/1961      BSA:          2.101 m Patient Age:    57 years        BP:           130/100 mmHg Patient Gender: M               HR:           70 bpm. Exam Location:  Inpatient Procedure: 2D Echo, Cardiac Doppler and Color Doppler Indications:    CHF  History:        Patient has prior history of Echocardiogram examinations, most                 recent 04/03/2020. CHF and Cardiomyopathy, Arrythmias:Atrial                 Fibrillation, Signs/Symptoms:Shortness of Breath; Risk                 Factors:Current Smoker, Hypertension, Dyslipidemia and Diabetes.                 Cocaine abuse.  Sonographer:    Dustin Flock Referring Phys: [phone] [facility] [place]  1. Left ventricular ejection fraction, by estimation, is 30 to 35%. The left ventricle has moderately decreased  function. The left ventricle demonstrates global hypokinesis. The left ventricular internal cavity size was mildly dilated. There is mild left ventricular hypertrophy. Left ventricular diastolic parameters are indeterminate.  2. Right ventricular systolic function is mildly reduced. The right ventricular size is normal. There is moderately elevated pulmonary artery systolic pressure. The estimated right ventricular systolic pressure is 22.0 mmHg.  3. Left atrial size was moderately dilated.  4. Right atrial size was moderately dilated.  5. The mitral valve is normal in structure. Moderate mitral valve regurgitation, suspect functional. No evidence of mitral stenosis.  6. The aortic valve is tricuspid. Aortic valve regurgitation is mild. No aortic stenosis is present.  7. The inferior vena cava is dilated in size with <50% respiratory variability, suggesting right atrial pressure of 15 mmHg.  8. The patient was in atrial fibrillation. FINDINGS  Left Ventricle: Left ventricular ejection fraction, by estimation, is 30 to 35%. The left ventricle has moderately decreased function. The left ventricle demonstrates global hypokinesis. The left ventricular internal cavity size was mildly dilated. There is mild left ventricular hypertrophy. Left ventricular diastolic parameters are indeterminate. Right Ventricle: The right ventricular size is normal. No increase in right ventricular wall thickness. Right ventricular systolic function is mildly reduced. There is moderately elevated pulmonary artery systolic pressure. The tricuspid regurgitant velocity is 3.14 m/s, and with an assumed  right atrial pressure of 15 mmHg, the estimated right ventricular systolic pressure is 89.3 mmHg. Left Atrium: Left atrial size was moderately dilated. Right Atrium: Right atrial size was moderately dilated. Pericardium: Trivial pericardial effusion is present. Mitral Valve: The mitral valve is normal in structure. Mild mitral annular  calcification. Moderate mitral valve regurgitation. No evidence of mitral valve stenosis. Tricuspid Valve: The tricuspid valve is normal in structure. Tricuspid valve regurgitation is mild. Aortic Valve: The aortic valve is tricuspid. Aortic valve regurgitation is mild. Aortic regurgitation PHT measures 923 msec. No aortic stenosis is present. Pulmonic Valve: The pulmonic valve was normal in structure. Pulmonic valve regurgitation is trivial. Aorta: The aortic root is normal in size and structure. Venous: The inferior vena cava is dilated in size with less than 50% respiratory variability, suggesting right atrial pressure of 15 mmHg. IAS/Shunts: No atrial level shunt detected by color flow Doppler.  LEFT VENTRICLE PLAX 2D LVIDd:         5.90 cm  Diastology LVIDs:         5.10 cm  LV e' lateral:   8.35 cm/s LV PW:         1.30 cm  LV E/e' lateral: 13.9 LV IVS:        1.30 cm  LV e' medial:    6.89 cm/s LVOT diam:     2.40 cm  LV E/e' medial:  16.8 LV SV:         82 LV SV Index:   39 LVOT Area:     4.52 cm  RIGHT VENTRICLE RV Basal diam:  3.90 cm RV S prime:     8.67 cm/s TAPSE (M-mode): 2.8 cm LEFT ATRIUM            Index       RIGHT ATRIUM           Index LA diam:      5.20 cm  2.47 cm/m  RA Area:     30.20 cm LA Vol (A2C): 75.3 ml  35.83 ml/m RA Volume:   127.00 ml 60.43 ml/m LA Vol (A4C): 120.0 ml 57.10 ml/m  AORTIC VALVE LVOT Vmax:   106.00 cm/s LVOT Vmean:  52.300 cm/s LVOT VTI:    0.182 m AI PHT:      923 msec  AORTA Ao Root diam: 3.60 cm MITRAL VALVE                TRICUSPID VALVE MV Area (PHT): 3.99 cm     TR Peak grad:   39.4 mmHg MV Decel Time: 190 msec     TR Vmax:        314.00 cm/s MV E velocity: 116.00 cm/s MV A velocity: 51.40 cm/s   SHUNTS MV E/A ratio:  2.26         Systemic VTI:  0.18 m                             Systemic Diam: 2.40 cm Loralie Champagne MD Electronically signed by Loralie Champagne MD Signature Date/Time: 08/01/2020/6:43:29 PM    Final    VAS [place] LOWER EXTREMITY VENOUS (DVT) (ONLY  [facility] & [facility])  Result Date: 07/30/2020  Lower Venous DVTStudy Indications: Edema, and s/p neck surgery x1 week.  Risk Factors: Cirrhosis, CHF, hypoalbuminemia. Limitations: Poor ultrasound/tissue interface and position, hypersomnolence. Comparison Study: 07/20/2018- negative right lower extremity venous duplex Performing Technologist: Maudry Mayhew MHA, RDMS, RVT, RDCS  Examination  Guidelines: A complete evaluation includes B-mode imaging, spectral Doppler, color Doppler, and power Doppler as needed of all accessible portions of each vessel. Bilateral testing is considered an integral part of a complete examination. Limited examinations for reoccurring indications may be performed as noted. The reflux portion of the exam is performed with the patient in reverse Trendelenburg.  +---------+---------------+---------+-----------+----------+--------------+ RIGHT    CompressibilityPhasicitySpontaneityPropertiesThrombus Aging +---------+---------------+---------+-----------+----------+--------------+ CFV      Full           No       Yes                                 +---------+---------------+---------+-----------+----------+--------------+ SFJ      Full                                                        +---------+---------------+---------+-----------+----------+--------------+ FV Prox  Full                                                        +---------+---------------+---------+-----------+----------+--------------+ FV Mid   Full                                                        +---------+---------------+---------+-----------+----------+--------------+ FV DistalFull                                                        +---------+---------------+---------+-----------+----------+--------------+ PFV      Full                                                        +---------+---------------+---------+-----------+----------+--------------+ POP      Full            No       Yes                                 +---------+---------------+---------+-----------+----------+--------------+ PTV      Full                                                        +---------+---------------+---------+-----------+----------+--------------+ PERO     Full                                                        +---------+---------------+---------+-----------+----------+--------------+   +---------+---------------+---------+-----------+----------+--------------+  LEFT     CompressibilityPhasicitySpontaneityPropertiesThrombus Aging +---------+---------------+---------+-----------+----------+--------------+ CFV      Full           No       Yes                                 +---------+---------------+---------+-----------+----------+--------------+ FV Prox  Full                                                        +---------+---------------+---------+-----------+----------+--------------+ FV Mid   Full                                                        +---------+---------------+---------+-----------+----------+--------------+ FV DistalFull                                                        +---------+---------------+---------+-----------+----------+--------------+ PFV      Full                                                        +---------+---------------+---------+-----------+----------+--------------+ POP      Full           No       Yes                                 +---------+---------------+---------+-----------+----------+--------------+ PTV      Full                                                        +---------+---------------+---------+-----------+----------+--------------+ PERO     Full                                                        +---------+---------------+---------+-----------+----------+--------------+   Left Technical Findings: Not visualized segments include SFJ.    Summary: RIGHT: - There is no evidence of deep vein thrombosis in the lower extremity.  - No cystic structure found in the popliteal fossa.  LEFT: - There is no evidence of deep vein thrombosis in the lower extremity. However, portions of this examination were limited- see technologist comments above.  - No cystic structure found in the popliteal fossa.  Bilateral lower extremity venous flow is pulsatile, suggestive of possible elevated right heart pressure.  *See table(s) above for measurements and observations. Electronically signed by Ruta Hinds MD on 07/30/2020 at 5:16:03 PM.  Final      Time coordinating discharge: Over 30 minutes    Dwyane Dee, MD  [facility] 08/23/2020, 3:58 PM Pager: Secure chat  If 7PM-7AM, please contact night-coverage [email or web address] Password [email or web address]

## 2020-08-23 NOTE — Telephone Encounter (Signed)
Spoke to Brad [name] who reports he will be discharged today or tomorrow. Brad [name] understood to be sure to stay hydrated and eat properly upon discharge and agreed with me coming out on Tuesday next week. Brad [name] reports his phone is messed up and will attempt to call me once discharged. I will go out to home Tuesday morning. He agreed. Call complete.

## 2020-08-23 NOTE — Telephone Encounter (Signed)
Patient returned CM call Brad [name]. Patient states he is in the hospital and can be reached at [phone].

## 2020-08-23 NOTE — Assessment & Plan Note (Addendum)
-   baseline creatinine appears to be 1.1-1.2 , he has history of fluctuation with acute injuries superimposed; given underlying history of DMII and HTN with microalbuminuria (100), he likely has component of underlying CKD - continue to monitor for further progression; no need for further intervention as of yet - would benefit from ACEi however has anaphylaxis listed for allergies so will have to hold off on ACEi or ARB at this time - continue asa and statin

## 2020-08-23 NOTE — Telephone Encounter (Signed)
Attempted to contact the patient at the hospital but he has been discharged and he does not have his personal phone

## 2020-08-23 NOTE — Progress Notes (Signed)
Patient discharging home.  IV removed - WNL.  Reviewed AVS and medications - verbalizes understanding.  Transport service called for pick-up and waiver signed by patient.  Patient has no questions at this time, instructed to follow up with PCP, cardio, and orthopedics.  Patient in NAD at this time

## 2020-08-24 ENCOUNTER — Telehealth: Payer: Self-pay

## 2020-08-24 IMAGING — CT CT HEAD WITHOUT CONTRAST
4 series · 16 of 47 positions shown, 18 images · non-contrast
Comparison: CT head June 06, 2019

CLINICAL DATA: Altered level of consciousness, substance use

EXAM:
CT HEAD WITHOUT CONTRAST
TECHNIQUE: Contiguous axial images were obtained from the base of the skull
through the vertex without intravenous contrast.

[Series 3: head wo · axial · 0.46mm/px · z∈[-108,+16]mm · 6 of 35 slices shown, 8 images]
[im 5/35  brain]
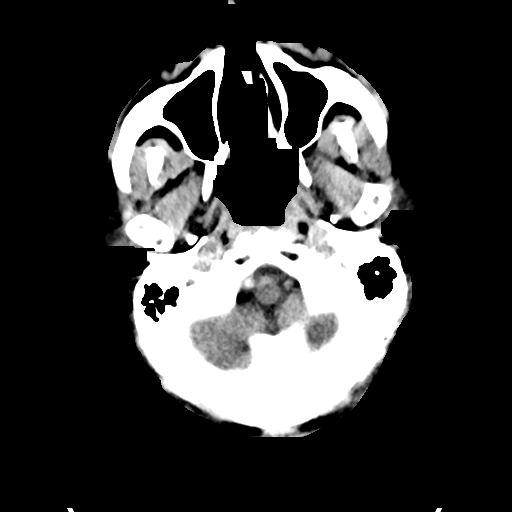
[im 5/35  bone]
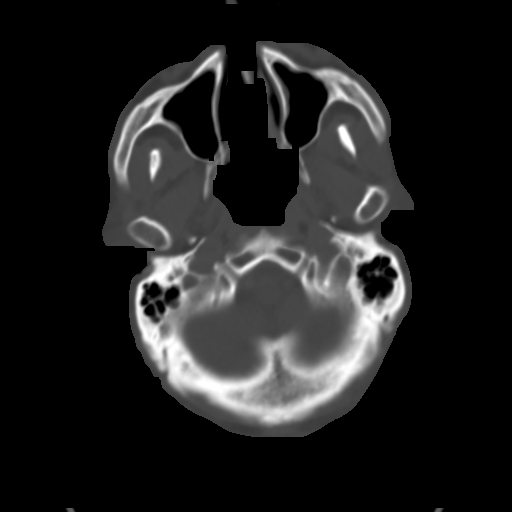
[im 10/35  brain]
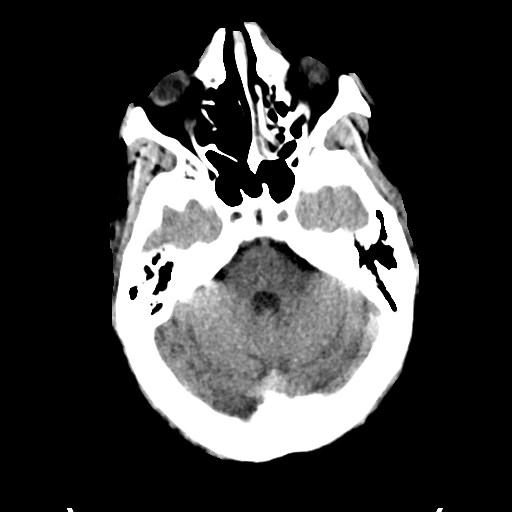
[im 15/35  brain]
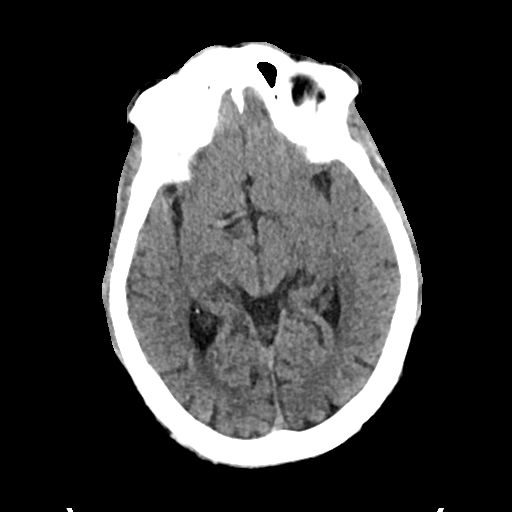
[im 20/35  brain]
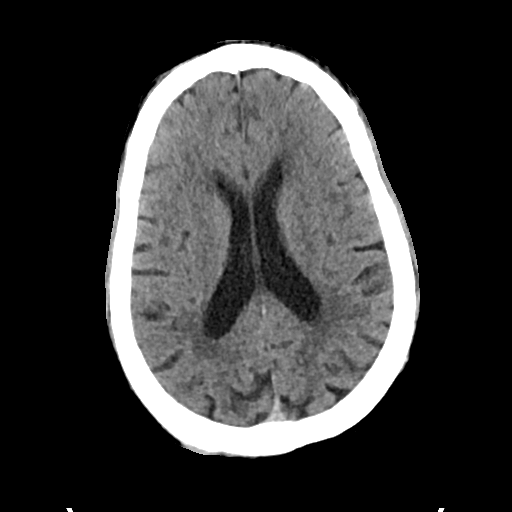
[im 25/35  brain]
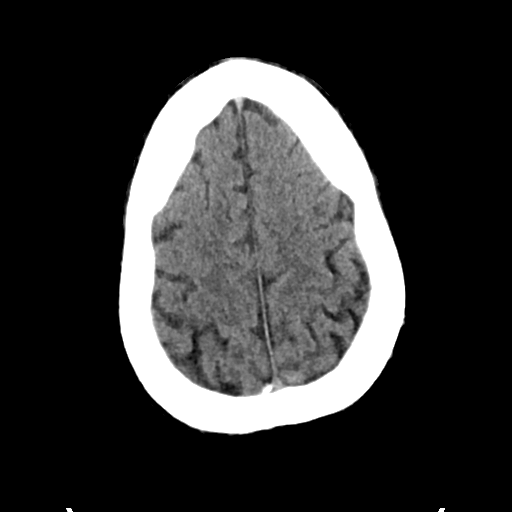
[im 25/35  bone]
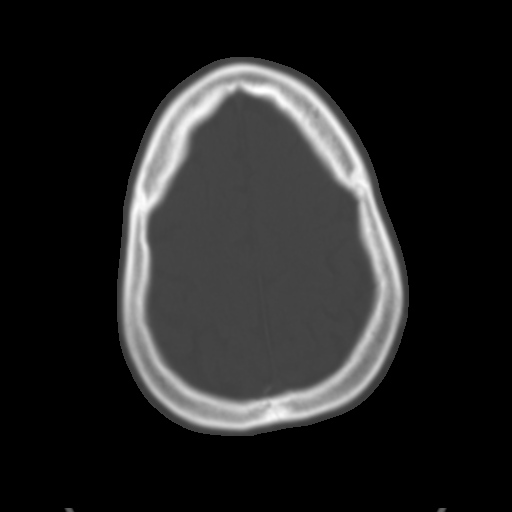
[im 30/35  brain]
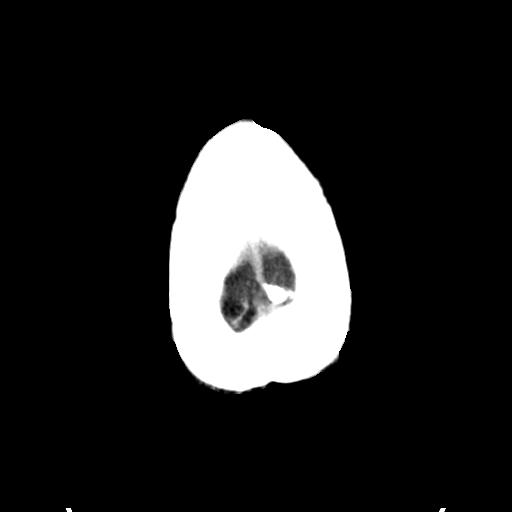

[Series 4: head bone · axial · 0.46mm/px · z∈[-112,-54]mm · 4 of 89 slices shown]
[im 9/89  bone]
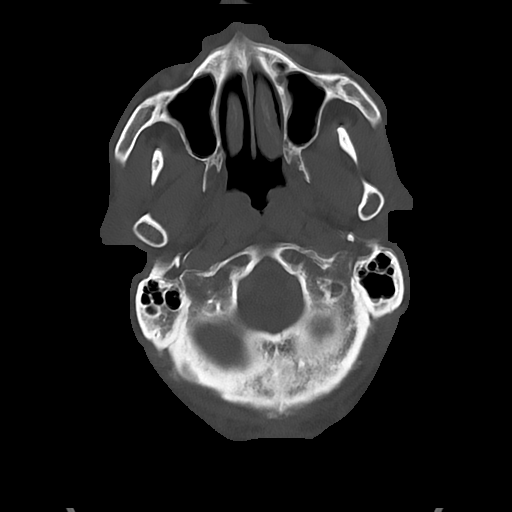
[im 17/89  bone]
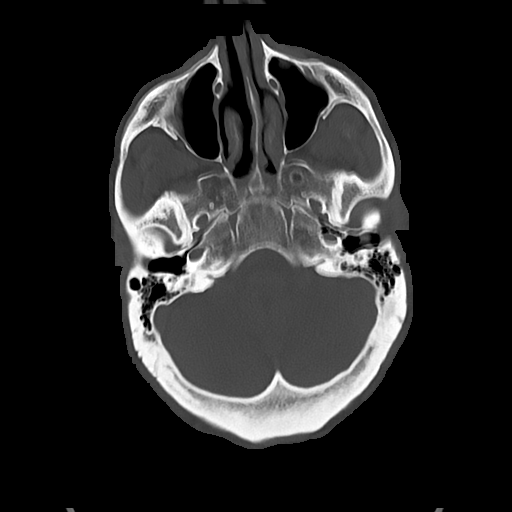
[im 30/89  bone]
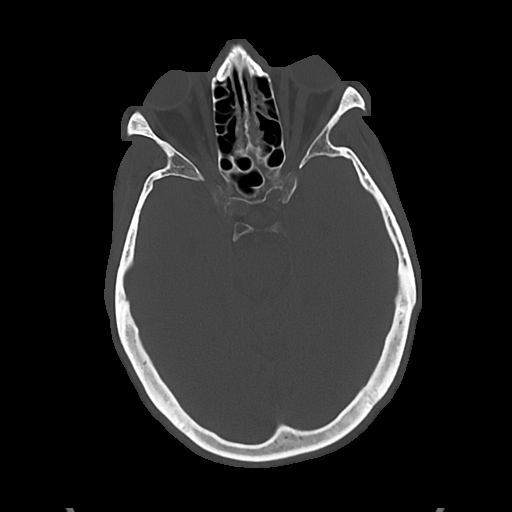
[im 38/89  bone]
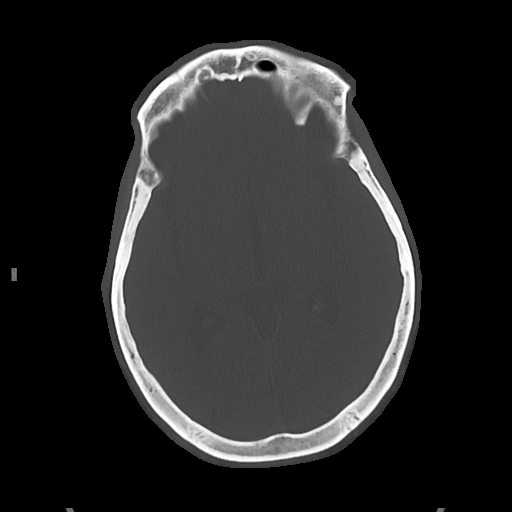

[Series 5: cor soft · coronal · 0.35mm/px · 3 of 76 slices shown]
[im 26/76  brain]
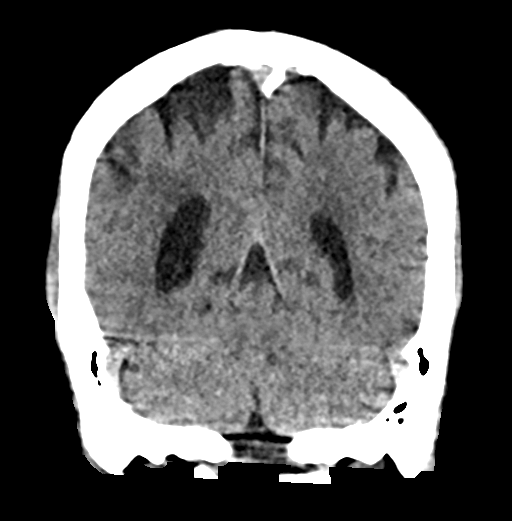
[im 34/76  brain]
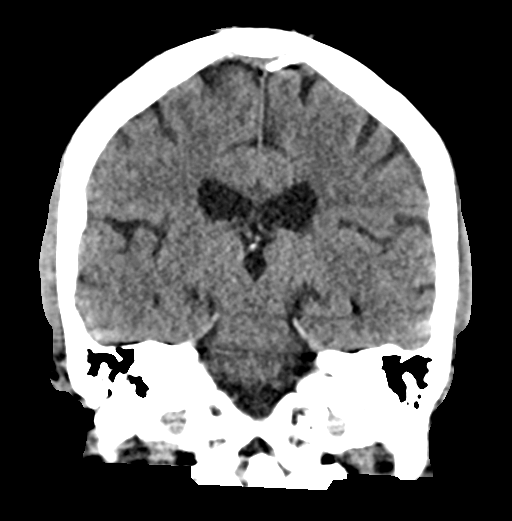
[im 42/76  brain]
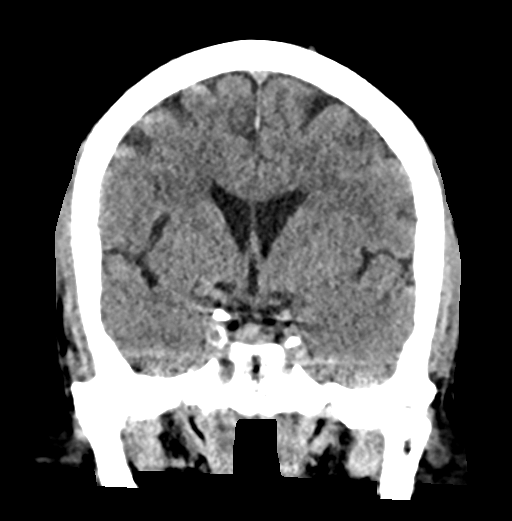

[Series 6: sag soft · sagittal · 0.34mm/px · 3 of 60 slices shown]
[im 20/60  brain]
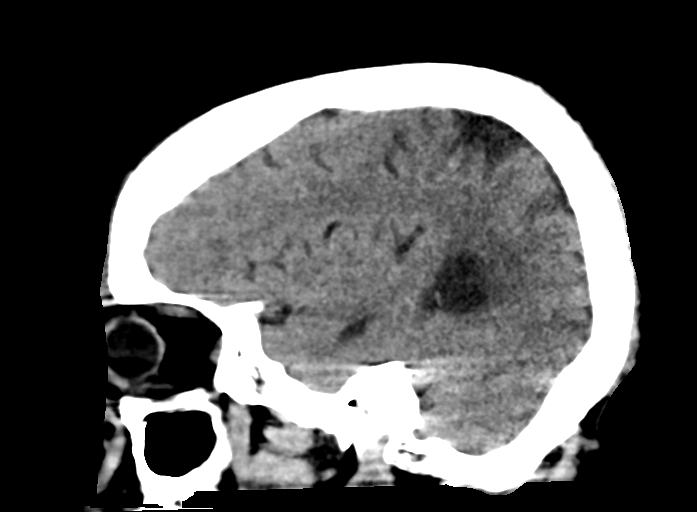
[im 30/60  brain]
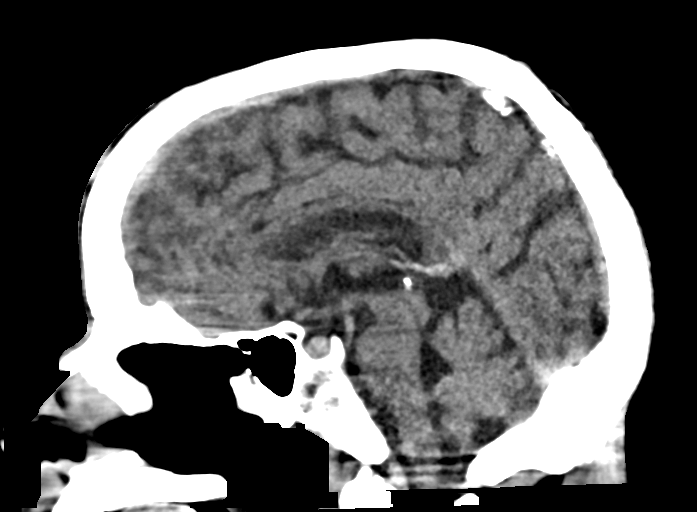
[im 40/60  brain]
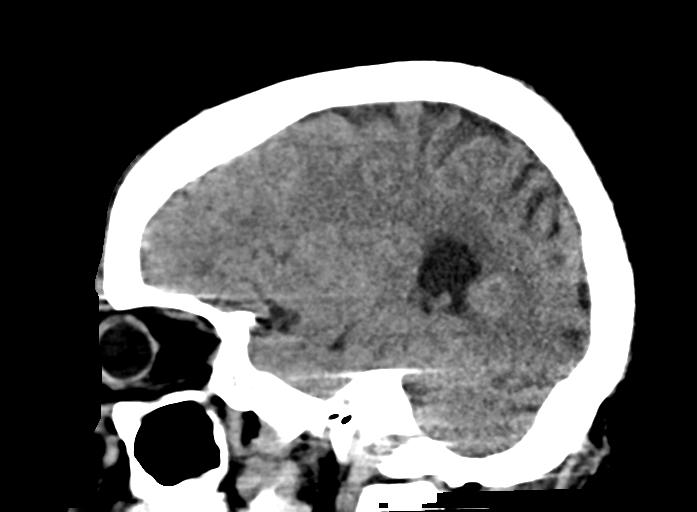

[16 of 47 positions shown; findings below may reference images not displayed]

FINDINGS: Brain: Redemonstration of age advanced parenchymal atrophy and
chronic microvascular changes. No evidence of acute infarction,
hemorrhage, hydrocephalus, extra-axial collection or mass
lesion/mass effect.

Vascular: Atherosclerotic calcification of the carotid siphons. No
hyperdense vessel or unexpected calcification.

Skull: No calvarial fracture or suspicious osseous lesion. No scalp
swelling or hematoma. Stable right parietooccipital soft tissue
infiltration, likely scarring.

Sinuses/Orbits: Mild thickening in the left ethmoids. Remainder of
the paranasal sinuses are predominantly clear. Mastoid air cells are
well aerated. Debris noted in the external auditory canals. Included
orbital structures are unremarkable.

Other: None.
IMPRESSION: *No acute intracranial abnormality.
*Stable age advanced parenchymal atrophy and chronic microvascular
ischemic changes.
*Scarring of the right parieto-occipital scalp.

## 2020-08-24 IMAGING — CT CT ANGIO CHEST-ABD-PELV FOR DISSECTION W/ AND WO/W CM
2 of 7 series · 11 of 46 positions shown, 12 images · IV contrast (APPLIED)
Comparison: Chest radiograph August 01, 2019, CT abdomen pelvis
July 29, 2019

CLINICAL DATA: Chest pain, acute aortic syndrome suspected

EXAM:
CT ANGIOGRAPHY CHEST, ABDOMEN AND PELVIS
TECHNIQUE: Multidetector CT imaging through the chest, abdomen and pelvis was
performed using the standard protocol during bolus administration of
intravenous contrast. Multiplanar reconstructed images and MIPs were
obtained and reviewed to evaluate the vascular anatomy.
CONTRAST:  100mL OMNIPAQUE IOHEXOL 350 MG/ML SOLN

[Series 6: arterial · axial · arterial · 0.68mm/px · z∈[-851,-313]mm · 8 of 341 slices shown, 9 images]
[im 36/341  soft-tissue]
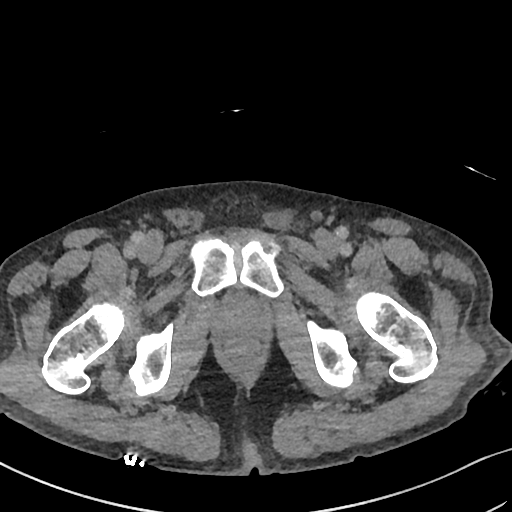
[im 36/341  bone]
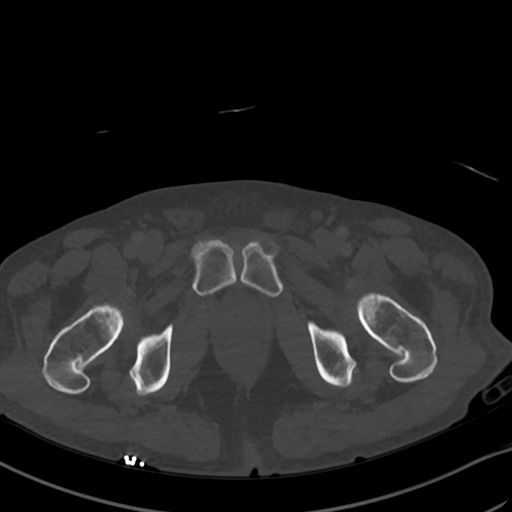
[im 72/341  soft-tissue]
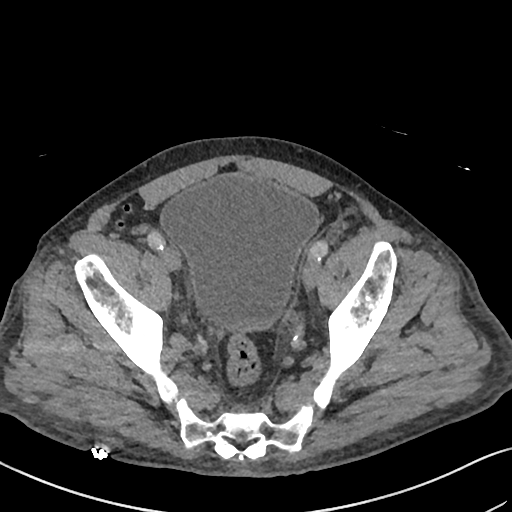
[im 108/341  soft-tissue]
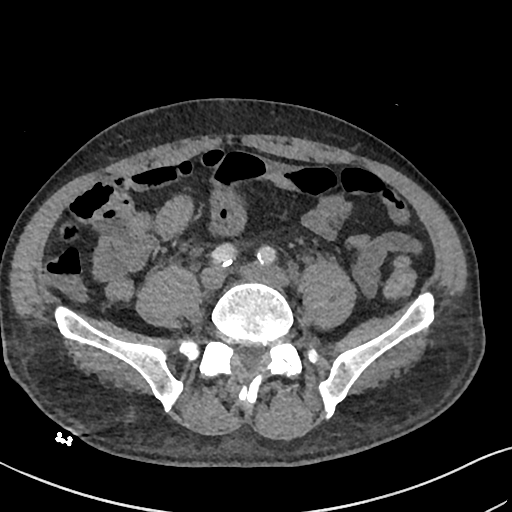
[im 144/341  soft-tissue]
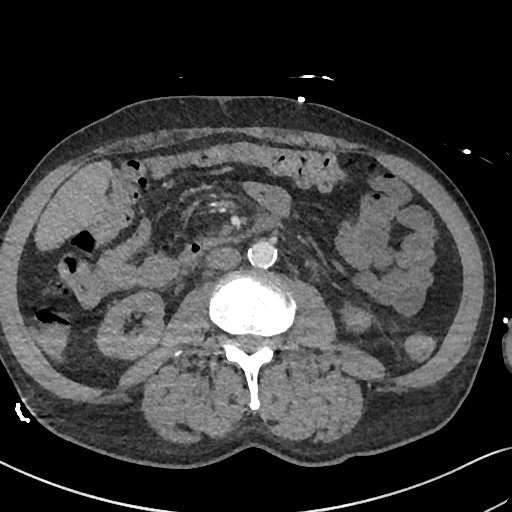
[im 197/341  soft-tissue]
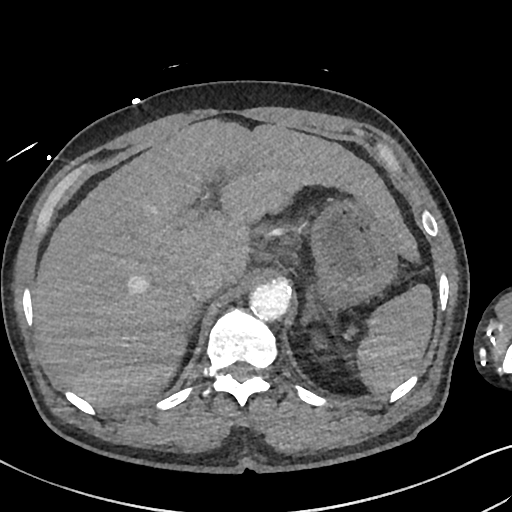
[im 233/341  soft-tissue]
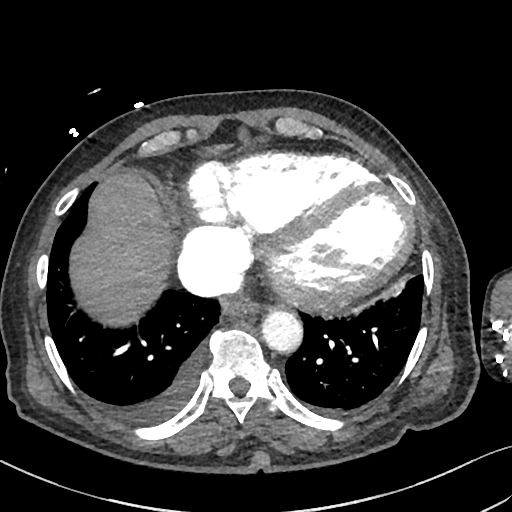
[im 269/341  soft-tissue]
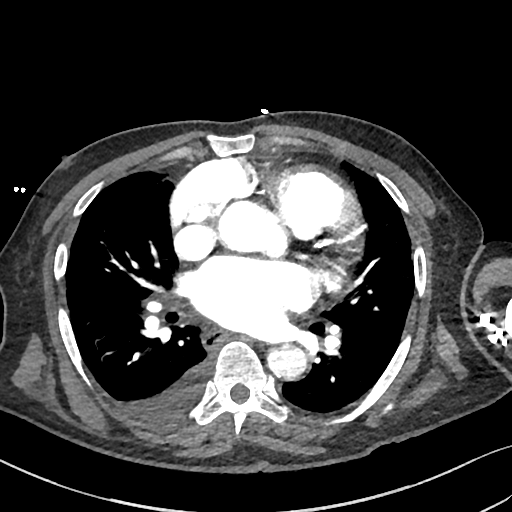
[im 305/341  soft-tissue]
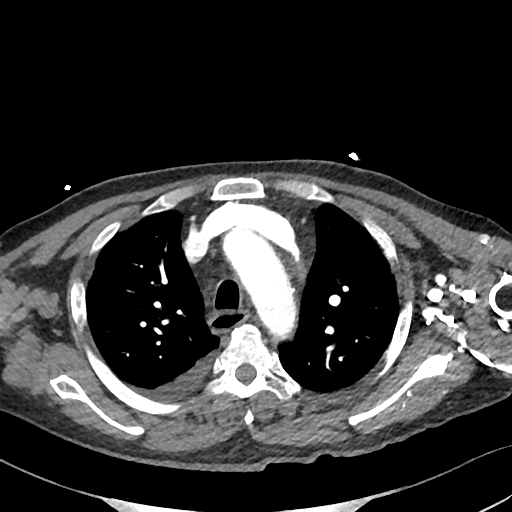

[Series 9: cor · coronal · 0.71mm/px · 3 of 138 slices shown]
[im 35/138  soft-tissue]
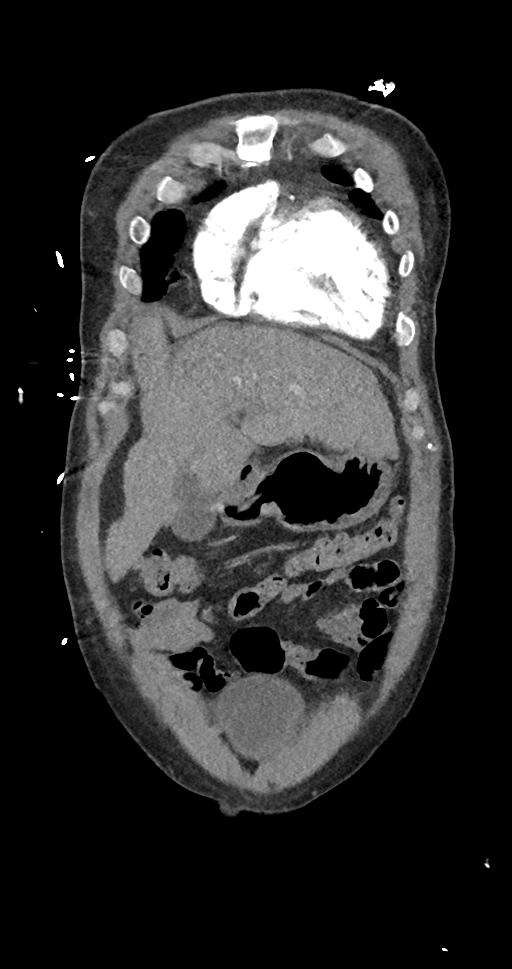
[im 69/138  soft-tissue]
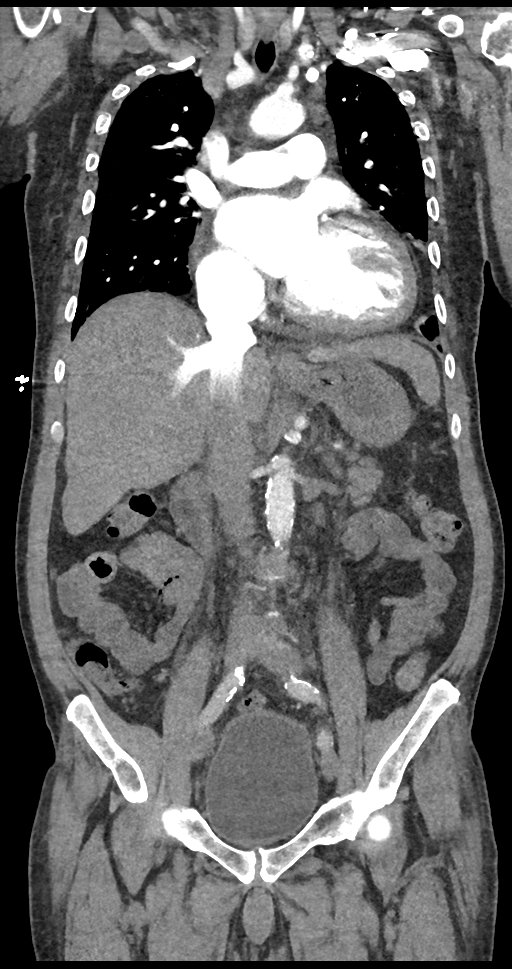
[im 103/138  soft-tissue]
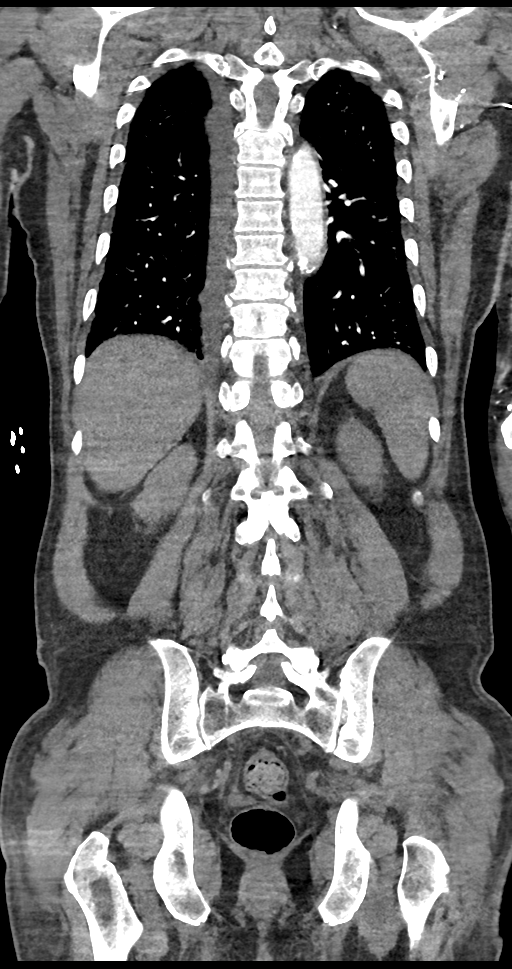

[11 of 46 positions shown; findings below may reference images not displayed]

FINDINGS: CTA CHEST FINDINGS

Cardiovascular: Noncontrast CT of the chest demonstrates no abnormal
plaque displacement or hyperdense mural thickening to suggest
intramural hemorrhage. Post-contrast CT imaging demonstrates
suboptimal bolus timing which preferentially opacifies the pulmonary
arteries rather than the thoracic aorta. Images do remain of
diagnostic utility for evaluation of thoracic aortic injury. The
aortic root is suboptimally assessed given cardiac pulsation
artifact. Normal 3 vessel arch. Atherosclerotic plaque within the
normal caliber aorta. No intramural hematoma, dissection flap or
other luminal abnormality of the aorta is seen. No periaortic
stranding or hemorrhage. Satisfactory opacification of the pulmonary
arteries to the segmental level. No central or segmental pulmonary
artery filling defects are identified. Pulmonary arteries are
enlarged centrally but without evidence of right heart strain.

There is cardiomegaly with biatrial enlargement and reflux of
contrast into the hepatic veins suggesting right heart failure
likely contributing to the suboptimal bolus timing.

Mediastinum/Nodes: Few engorged but non pathologically enlarged
mediastinal and hilar nodes are present. No enlarged axillary lymph
nodes.

Lungs/Pleura: Diffuse hazy ground-glass attenuation throughout the
lungs with extensive interlobular septal thickening and
peribronchovascular thickening. No focal consolidative process.
Bilateral pleural effusions, trace in the left and small on the
right.

Musculoskeletal: Multilevel degenerative changes are present in the
imaged portions of the spine. No acute osseous abnormality or
suspicious osseous lesion.

Review of the MIP images confirms the above findings.

CTA ABDOMEN AND PELVIS FINDINGS

VASCULAR

Aorta: Atherosclerotic plaque within the normal caliber aorta. No
evidence of dissection, vasculitis or significant stenosis.

Celiac: Mild atheromatous narrowing of the ostium. No evidence of
aneurysm, dissection or vasculitis.

SMA: Plaque at the ostium without significant stenosis. No evidence
of aneurysm, dissection or vasculitis.

Renals: Single renal arteries bilaterally. Mild plaque at the ostia
without most mild stenosis on the right. No evidence of aneurysm,
dissection or vasculitis. No evidence of fibromuscular dysplasia.

IMA: Patent without evidence of aneurysm, dissection, vasculitis or
significant stenosis.

Inflow: Atheromatous plaque throughout the inflow. No evidence of
aneurysm, ectasias, dissection or vasculitis.

Veins: Limited venous evaluation demonstrates some upper abdominal
venous collateralization but no other overt abnormality.

Review of the MIP images confirms the above findings.

NON-VASCULAR

Hepatobiliary: Markedly nodular liver surface contour compatible
with history of cirrhosis no visible hepatic lesions.
Circumferential thickening of the gallbladder without visible
calcified gallstones or biliary ductal dilatation.

Pancreas: Unremarkable. No pancreatic ductal dilatation or
surrounding inflammatory changes.

Spleen: Normal in size without focal abnormality.

Adrenals/Urinary Tract: Normal adrenal glands. Mild bilateral
nonspecific perinephric stranding, a nonspecific finding though may
correlate with either age or decreased renal function. Kidneys are
otherwise unremarkable, without renal calculi, suspicious lesion, or
hydronephrosis. Mild bladder wall thickening. Indentation of the
posterior bladder wall by median hypertrophy of the enlarged
prostate.

Stomach/Bowel: Distal esophagus, stomach and duodenal sweep are
unremarkable. No bowel wall thickening or dilatation. No evidence of
obstruction. A normal appendix is visualized. Scattered colonic
diverticula without focal pericolonic inflammation to suggest
diverticulitis.

Lymphatic: No suspicious or enlarged lymph nodes in the included
lymphatic chains.

Reproductive: Prostatomegaly with median lobe hypertrophy and
indentation of the posterior bladder, as above. Subtle vesicles are
symmetric.

Other: Slight hazy increased attenuation of the mid mesentery, may
reflect volume 3rd spacing. No abdominopelvic free fluid or free
gas. No bowel containing hernias.

Musculoskeletal: No acute osseous abnormality or suspicious osseous
lesion. Mild degenerative changes are present in the imaged portions
of the spine.

Review of the MIP images confirms the above findings.
IMPRESSION: 1. Suboptimal contrast bolus preferentially opacify the pulmonary
arteries. Within the limitations of this exam there is no convincing
CT evidence of acute aortic syndrome.
2. No evidence of central or segmental pulmonary artery embolism.
3. Cardiomegaly with biatrial enlargement and reflux of contrast
into the hepatic veins suggesting right heart failure likely
contributing to the suboptimal bolus timing.
4. Diffuse hazy ground-glass attenuation throughout the lungs with
extensive interlobular septal thickening and peribronchovascular
thickening, consistent with pulmonary edema.
5. Bilateral pleural effusions, trace in the left and small on the
right.
6. Markedly nodular liver surface contour compatible with history of
cirrhosis. No visible hepatic lesions.
7. Circumferential thickening of the gallbladder wall without
visible calcified gallstones or biliary ductal dilatation. Findings
are nonspecific and may be related to underlying liver disease. If
there is clinical concern for acute cholecystitis, consider further
evaluation with right upper quadrant ultrasound.
8. Hazy stranding in the mesentery as well as circumferential body
wall edema likely reflective of volume third-spacing.
9. Prostatomegaly with median lobe hypertrophy and indentation of
the posterior bladder wall. Mild bladder wall thickening is possibly
related to chronic outlet obstruction. Recommend correlation with
urinalysis to exclude cystitis.
10. Aortic Atherosclerosis ([id number]-Q95.5). Mild ostial narrowing of
the celiac artery.

## 2020-08-24 NOTE — Telephone Encounter (Signed)
Transition Care Management Follow-up Telephone Call Date of discharge and from where: 08/23/2020, [facility]   Call placed to patient # [phone], message left with call back requested to this CM.  He needs to schedule follow up appointment with PCP

## 2020-08-28 ENCOUNTER — Other Ambulatory Visit (HOSPITAL_COMMUNITY): Payer: Self-pay

## 2020-08-28 ENCOUNTER — Telehealth: Payer: Self-pay

## 2020-08-28 NOTE — Telephone Encounter (Signed)
Transition Care Management Follow-up Telephone Call Attempt # 2  Date of discharge and from where: 08/23/2020, [facility]   Call placed to patient # [phone], message left with call back requested to this CM. He has been having problems with his phone.   He needs to schedule follow up appointment with PCP.  [name], EMT is planning to see the patient this week and can contact this CM to schedule the appointment for the patient.

## 2020-08-28 NOTE — Progress Notes (Signed)
Paramedicine Encounter    Patient ID: Brad [name], male    DOB: 1961-06-20, 59 y.o.   MRN: [id number]   Patient Care Team: Charlott Rakes, MD as PCP - General (Family Medicine) Debara Pickett Nadean Corwin, MD as PCP - Cardiology (Cardiology) Charlott Rakes, MD (Family Medicine) Charlott Rakes, MD (Family Medicine)  Patient Active Problem List   Diagnosis Date Noted  . CKD (chronic kidney disease), stage II 08/23/2020  . Thumb fracture 08/23/2020  . Avulsion fracture of phalanx of right thumb 08/16/2020  . CHF exacerbation ([place]) 08/01/2020  . Cord compression ([place])   . Acute exacerbation of CHF (congestive heart failure) ([place]) 05/29/2020  . Chronic combined systolic and diastolic CHF (congestive heart failure) ([place]) 04/19/2020  . Alcohol abuse with intoxication ([place]) 03/23/2020  . Hyponatremia 03/23/2020  . Fall 03/23/2020  . Normocytic anemia 03/23/2020  . Leukopenia 03/23/2020  . HTN (hypertension) 03/23/2020  . HLD (hyperlipidemia) 03/23/2020  . AKI (acute kidney injury) ([place]) 03/23/2020  . CHF (congestive heart failure) ([place]) 03/23/2020  . Alcohol abuse with intoxication ([place]) 03/05/2020  . Chronic systolic CHF (congestive heart failure) ([place]) 10/31/2019  . Acute systolic CHF (congestive heart failure) ([place]) 08/02/2019  . Diarrhea 07/29/2019  . Gastroenteritis 07/22/2019  . Hypomagnesemia 06/19/2019  . Chest pain 06/07/2019  . Elevated troponin 05/23/2019  . Tobacco abuse 05/23/2019  . Alcohol abuse 02/21/2019  . Frequent falls 01/17/2019  . Severe mitral regurgitation   . Fall 11/01/2018  . Hyponatremia 10/31/2018  . Chronic atrial fibrillation   . Chronic hyponatremia 08/16/2018  . NSVT (nonsustained ventricular tachycardia) ([place])   . Concussion with loss of consciousness   . Scalp laceration   . Trauma   . Permanent atrial fibrillation   . Hypertensive heart disease   . Shortness of breath   . Pyogenic inflammation of bone ([place])   . Cirrhosis ([place]) 03/23/2018   . Right ankle pain 03/23/2018  . Syncope 08/07/2017  . Acute on chronic combined systolic and diastolic CHF (congestive heart failure) ([place]) 07/09/2017  . Atrial flutter ([place]) 07/09/2017  . Pre-syncope 07/08/2017  . Neuropathy 05/08/2017  . Substance induced mood disorder ([place]) 10/06/2016  . CVA (cerebral vascular accident) ([place]) 09/18/2016  . Left sided numbness   . Homelessness 08/21/2016  . S/P ORIF (open reduction internal fixation) fracture 08/01/2016  . CAD (coronary artery disease), native coronary artery 07/30/2016  . Surgery, elective   . Insomnia 07/22/2016  . NSTEMI (non-ST elevated myocardial infarction) ([place])   . Anemia 07/05/2016  . Thrombocytopenia ([place]) 07/05/2016  . Cocaine abuse ([place]) 07/02/2016  . Chest pain on breathing 07/01/2016  . Essential hypertension 07/01/2016  . DM type 2 (diabetes mellitus, type 2) ([place]) 07/01/2016  . Hypokalemia 07/01/2016  . CKD (chronic kidney disease) stage 3, GFR 30-59 ml/min 07/01/2016  . Painful diabetic neuropathy ([place]) 07/01/2016  . Acute hyponatremia 05/27/2016  . Polysubstance abuse ([place]) 05/27/2016  . Chronic hepatitis C with cirrhosis ([place]) 05/27/2016  . Depression 04/21/2012  . GERD (gastroesophageal reflux disease) 02/16/2012  . History of drug abuse ([place])   . Heroin addiction ([place]) 01/29/2012    Current Outpatient Medications:  .  amiodarone (PACERONE) 200 MG tablet, Take 1 tablet (200 mg total) by mouth daily., Disp: 30 tablet, Rfl: 0 .  aspirin EC 81 MG tablet, Take 1 tablet (81 mg total) by mouth daily. Swallow whole. (Patient not taking: Reported on 08/20/2020), Disp: 90 tablet, Rfl: 3 .  atorvastatin (LIPITOR) 20 MG tablet, Take 1 tablet (20 mg  total) by mouth daily at 6 PM., Disp: 30 tablet, Rfl: 3 .  dapagliflozin propanediol (FARXIGA) 5 MG TABS tablet, Take 1 tablet (5 mg total) by mouth daily before breakfast., Disp: 30 tablet, Rfl: 3 .  digoxin (LANOXIN) 0.125 MG tablet, Take 1 tablet (0.125 mg total) by  mouth daily. (Patient not taking: Reported on 08/20/2020), Disp: 30 tablet, Rfl: 3 .  folic acid (FOLVITE) 1 MG tablet, Take 1 tablet (1 mg total) by mouth daily. (Patient not taking: Reported on 08/20/2020), Disp: 30 tablet, Rfl: 0 .  gabapentin (NEURONTIN) 300 MG capsule, Take 2 capsules (600 mg total) by mouth 2 (two) times daily., Disp: 60 capsule, Rfl: 3 .  isosorbide-hydrALAZINE (BIDIL) 20-37.5 MG tablet, Take 1 tablet by mouth 3 (three) times daily., Disp: 90 tablet, Rfl: 3 .  Multiple Vitamin (MULTIVITAMIN WITH MINERALS) TABS tablet, Take 1 tablet by mouth daily., Disp: , Rfl:  .  potassium chloride SA (KLOR-CON) 20 MEQ tablet, Take 20 mEq by mouth daily., Disp: , Rfl:  .  spironolactone (ALDACTONE) 25 MG tablet, Take 0.5 tablets (12.5 mg total) by mouth daily., Disp: 30 tablet, Rfl: 3 .  thiamine (VITAMIN B-1) 100 MG tablet, Take 100 mg by mouth daily., Disp: , Rfl:  .  torsemide (DEMADEX) 20 MG tablet, Take 2 tablets (40 mg total) by mouth daily., Disp: 30 tablet, Rfl: 2 Allergies  Allergen Reactions  . Angiotensin Receptor Blockers Anaphylaxis and Other (See Comments)    (Angioedema also with Lisinopril, therefore ARB's are contraindicated)  . Lisinopril Anaphylaxis and Swelling    Throat swelling  . Pamelor [Nortriptyline Hcl] Anaphylaxis and Swelling    Throat swells     Social History   Socioeconomic History  . Marital status: Single    Spouse name: Not on file  . Number of children: 3  . Years of education: 2y college  . Highest education level: Not on file  Occupational History  . 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 Smoking status: Current Every Day Smoker    Packs/day: 0.50    Years: 28.00    Pack years: 14.00    Types: Cigarettes  . Smokeless tobacco: Never Used  Vaping Use  . Vaping Use: Never used  Substance and Sexual Activity  . Alcohol use: Yes    Alcohol/week: 25.0 standard drinks     Types: 25 Cans of beer per week    Comment: "depends on the day"; reports not drinking every day, "I stopped about 2-3 wekes ago" - but drank yesterday  . Drug use: Not Currently    Types: IV, Cocaine, Heroin    Comment: 08/17/2018 "no heroin since 2013; I use cocaine ~ once/month, last use 03/21/2019  . Sexual activity: Not Currently  Other Topics Concern  . Not on file  Social History Narrative   ** Merged History Encounter **       ** Merged History Encounter **       Incarcerated from 2006-2010, then 10/2011-12/2011.  Has been trying to get sober (no heroin, alcohol since 10/2011).    Social Determinants of Health   Financial Resource Strain:   . Difficulty of Paying Living Expenses: Not on file  Food Insecurity: No Food Insecurity  . Worried About Charity fundraiser in the Last Year: Never true  . Ran Out of Food in the Last Year: Never true  Transportation Needs:   . Lack  of Transportation (Medical): Not on file  . Lack of Transportation (Non-Medical): Not on file  Physical Activity:   . Days of Exercise per Week: Not on file  . Minutes of Exercise per Session: Not on file  Stress:   . Feeling of Stress : Not on file  Social Connections:   . Frequency of Communication with Friends and Family: Not on file  . Frequency of Social Gatherings with Friends and Family: Not on file  . Attends Religious Services: Not on file  . Active Member of Clubs or Organizations: Not on file  . Attends Archivist Meetings: Not on file  . Marital Status: Not on file  Intimate Partner Violence:   . Fear of Current or Ex-Partner: Not on file  . Emotionally Abused: Not on file  . Physically Abused: Not on file  . Sexually Abused: Not on file    Physical Exam Vitals reviewed.  Constitutional:      Appearance: He is normal weight.  HENT:     Head: Normocephalic.     Nose: Nose normal.     Mouth/Throat:     Mouth: Mucous membranes are moist.     Pharynx: Oropharynx is  clear.  Eyes:     Pupils: Pupils are equal, round, and reactive to light.  Cardiovascular:     Rate and Rhythm: Normal rate and regular rhythm.     Pulses: Normal pulses.     Heart sounds: Normal heart sounds.  Pulmonary:     Effort: Pulmonary effort is normal.     Breath sounds: Normal breath sounds.  Abdominal:     General: Abdomen is flat.     Palpations: Abdomen is soft.  Musculoskeletal:        General: Normal range of motion.     Right lower leg: Edema present.     Left lower leg: Edema present.  Skin:    General: Skin is warm and dry.     Capillary Refill: Capillary refill takes less than 2 seconds.  Neurological:     Mental Status: He is alert. Mental status is at baseline.  Psychiatric:        Mood and Affect: Mood normal.       Arrived for home visit for Brad [name] who was alert and oriented complaining of bilateral hands tingling. Brad [name] reports he has not been taking his medicines since being discharged from hospital. I reviewed meds and confirmed same and filled pill box accordingly. Brad [name] was made aware of upcoming appointments. Vitals obtained and are as noted. Brad [name] agreed to visit in one week. Home visit complete.   Refills- Torsemide    Future Appointments  Date Time Provider [place]  09/10/2020 12:00 PM [facility] PA/NP [facility] None     ACTION: Home visit completed Next visit planned for one week

## 2020-08-29 ENCOUNTER — Telehealth (HOSPITAL_COMMUNITY): Payer: Self-pay

## 2020-08-29 NOTE — Telephone Encounter (Signed)
Appointment made for Hand Specialist follow up for Monday the 13th at 10:45. I will print and fill out paperwork for patient to turn in at appointment. I will see patient that morning at 0900.

## 2020-08-31 ENCOUNTER — Other Ambulatory Visit (HOSPITAL_COMMUNITY): Payer: Self-pay | Admitting: *Deleted

## 2020-08-31 DIAGNOSIS — I5042 Chronic combined systolic (congestive) and diastolic (congestive) heart failure: Secondary | ICD-10-CM

## 2020-08-31 DIAGNOSIS — E1159 Type 2 diabetes mellitus with other circulatory complications: Secondary | ICD-10-CM

## 2020-08-31 DIAGNOSIS — I110 Hypertensive heart disease with heart failure: Secondary | ICD-10-CM

## 2020-08-31 DIAGNOSIS — Z794 Long term (current) use of insulin: Secondary | ICD-10-CM

## 2020-08-31 MED ORDER — DIGOXIN 125 MCG PO TABS
0.1250 mg | ORAL_TABLET | Freq: Every day | ORAL | 6 refills | Status: DC
Start: 2020-08-31 — End: 2020-09-10

## 2020-08-31 MED ORDER — DAPAGLIFLOZIN PROPANEDIOL 5 MG PO TABS: 5.0000 mg | ORAL_TABLET | Freq: Every day | ORAL | 6 refills | Status: DC

## 2020-08-31 MED ORDER — ATORVASTATIN CALCIUM 20 MG PO TABS: 20.0000 mg | ORAL_TABLET | Freq: Every day | ORAL | 6 refills | Status: AC

## 2020-08-31 MED ORDER — POTASSIUM CHLORIDE CRYS ER 20 MEQ PO TBCR: 20.0000 meq | EXTENDED_RELEASE_TABLET | Freq: Every day | ORAL | 6 refills | Status: DC

## 2020-08-31 MED ORDER — AMIODARONE HCL 200 MG PO TABS: 200.0000 mg | ORAL_TABLET | Freq: Every day | ORAL | 6 refills | Status: DC

## 2020-08-31 MED ORDER — SPIRONOLACTONE 25 MG PO TABS: 12.5000 mg | ORAL_TABLET | Freq: Every day | ORAL | 6 refills | Status: DC

## 2020-08-31 MED ORDER — TORSEMIDE 20 MG PO TABS
40.0000 mg | ORAL_TABLET | Freq: Every day | ORAL | 6 refills | Status: DC
Start: 2020-08-31 — End: 2020-09-10

## 2020-08-31 MED ORDER — ISOSORB DINITRATE-HYDRALAZINE 20-37.5 MG PO TABS: 1.0000 | ORAL_TABLET | Freq: Three times a day (TID) | ORAL | 6 refills | Status: DC

## 2020-09-03 ENCOUNTER — Telehealth (HOSPITAL_COMMUNITY): Payer: Self-pay

## 2020-09-03 NOTE — Telephone Encounter (Signed)
Arrived for home visit for [name] who was not home at our scheduled visit. Spoke to [name] later in the day who advised he was out and had an appointment with housing and I advised him of his missed hand specialist appointment. He asked me to reschedule same. I will do so at a later date. I advised [name] to call me upon his arrival back home and he did so and upon attempting to go back out to apartment he was not home at that time. Roommate reports he went to store. I will attempt to see [name] sometime this week.

## 2020-09-04 ENCOUNTER — Inpatient Hospital Stay (HOSPITAL_COMMUNITY)
Admission: EM | Admit: 2020-09-04 | Discharge: 2020-09-10 | DRG: 641 | Disposition: A | Discharge status: Home / Self Care | Payer: Medicaid Other | Attending: Internal Medicine | Admitting: Internal Medicine

## 2020-09-04 ENCOUNTER — Other Ambulatory Visit: Payer: Self-pay

## 2020-09-04 ENCOUNTER — Inpatient Hospital Stay (HOSPITAL_COMMUNITY): Payer: Medicaid Other

## 2020-09-04 ENCOUNTER — Encounter (HOSPITAL_COMMUNITY): Payer: Self-pay

## 2020-09-04 ENCOUNTER — Emergency Department (HOSPITAL_COMMUNITY): Payer: Medicaid Other

## 2020-09-04 DIAGNOSIS — E1122 Type 2 diabetes mellitus with diabetic chronic kidney disease: Secondary | ICD-10-CM | POA: Diagnosis present

## 2020-09-04 DIAGNOSIS — N183 Chronic kidney disease, stage 3 unspecified: Secondary | ICD-10-CM | POA: Diagnosis present

## 2020-09-04 DIAGNOSIS — M1990 Unspecified osteoarthritis, unspecified site: Secondary | ICD-10-CM | POA: Diagnosis present

## 2020-09-04 DIAGNOSIS — Z9119 Patient's noncompliance with other medical treatment and regimen: Secondary | ICD-10-CM

## 2020-09-04 DIAGNOSIS — I130 Hypertensive heart and chronic kidney disease with heart failure and stage 1 through stage 4 chronic kidney disease, or unspecified chronic kidney disease: Secondary | ICD-10-CM | POA: Diagnosis present

## 2020-09-04 DIAGNOSIS — R079 Chest pain, unspecified: Secondary | ICD-10-CM | POA: Diagnosis present

## 2020-09-04 DIAGNOSIS — I4821 Permanent atrial fibrillation: Secondary | ICD-10-CM | POA: Diagnosis present

## 2020-09-04 DIAGNOSIS — I2510 Atherosclerotic heart disease of native coronary artery without angina pectoris: Secondary | ICD-10-CM | POA: Diagnosis present

## 2020-09-04 DIAGNOSIS — Z20822 Contact with and (suspected) exposure to covid-19: Secondary | ICD-10-CM | POA: Diagnosis present

## 2020-09-04 DIAGNOSIS — I5042 Chronic combined systolic (congestive) and diastolic (congestive) heart failure: Secondary | ICD-10-CM | POA: Diagnosis present

## 2020-09-04 DIAGNOSIS — K219 Gastro-esophageal reflux disease without esophagitis: Secondary | ICD-10-CM | POA: Diagnosis present

## 2020-09-04 DIAGNOSIS — R001 Bradycardia, unspecified: Secondary | ICD-10-CM | POA: Diagnosis present

## 2020-09-04 DIAGNOSIS — F1029 Alcohol dependence with unspecified alcohol-induced disorder: Secondary | ICD-10-CM | POA: Diagnosis not present

## 2020-09-04 DIAGNOSIS — L910 Hypertrophic scar: Secondary | ICD-10-CM | POA: Diagnosis present

## 2020-09-04 DIAGNOSIS — G8929 Other chronic pain: Secondary | ICD-10-CM | POA: Diagnosis present

## 2020-09-04 DIAGNOSIS — I10 Essential (primary) hypertension: Secondary | ICD-10-CM | POA: Diagnosis present

## 2020-09-04 DIAGNOSIS — E1165 Type 2 diabetes mellitus with hyperglycemia: Secondary | ICD-10-CM | POA: Diagnosis present

## 2020-09-04 DIAGNOSIS — E875 Hyperkalemia: Secondary | ICD-10-CM | POA: Diagnosis present

## 2020-09-04 DIAGNOSIS — Z8673 Personal history of transient ischemic attack (TIA), and cerebral infarction without residual deficits: Secondary | ICD-10-CM

## 2020-09-04 DIAGNOSIS — Z720 Tobacco use: Secondary | ICD-10-CM | POA: Diagnosis not present

## 2020-09-04 DIAGNOSIS — Z9181 History of falling: Secondary | ICD-10-CM | POA: Diagnosis not present

## 2020-09-04 DIAGNOSIS — F1020 Alcohol dependence, uncomplicated: Secondary | ICD-10-CM | POA: Diagnosis present

## 2020-09-04 DIAGNOSIS — E785 Hyperlipidemia, unspecified: Secondary | ICD-10-CM | POA: Diagnosis present

## 2020-09-04 DIAGNOSIS — G253 Myoclonus: Secondary | ICD-10-CM | POA: Diagnosis present

## 2020-09-04 DIAGNOSIS — N1831 Chronic kidney disease, stage 3a: Secondary | ICD-10-CM | POA: Diagnosis present

## 2020-09-04 DIAGNOSIS — E861 Hypovolemia: Secondary | ICD-10-CM | POA: Diagnosis present

## 2020-09-04 DIAGNOSIS — Z8249 Family history of ischemic heart disease and other diseases of the circulatory system: Secondary | ICD-10-CM

## 2020-09-04 DIAGNOSIS — Z7984 Long term (current) use of oral hypoglycemic drugs: Secondary | ICD-10-CM

## 2020-09-04 DIAGNOSIS — I4892 Unspecified atrial flutter: Secondary | ICD-10-CM | POA: Diagnosis present

## 2020-09-04 DIAGNOSIS — I482 Chronic atrial fibrillation, unspecified: Secondary | ICD-10-CM | POA: Diagnosis present

## 2020-09-04 DIAGNOSIS — Z888 Allergy status to other drugs, medicaments and biological substances status: Secondary | ICD-10-CM

## 2020-09-04 DIAGNOSIS — E1140 Type 2 diabetes mellitus with diabetic neuropathy, unspecified: Secondary | ICD-10-CM | POA: Diagnosis present

## 2020-09-04 DIAGNOSIS — E871 Hypo-osmolality and hyponatremia: Principal | ICD-10-CM | POA: Diagnosis present

## 2020-09-04 DIAGNOSIS — R200 Anesthesia of skin: Secondary | ICD-10-CM | POA: Diagnosis present

## 2020-09-04 DIAGNOSIS — E1159 Type 2 diabetes mellitus with other circulatory complications: Secondary | ICD-10-CM | POA: Diagnosis not present

## 2020-09-04 DIAGNOSIS — E119 Type 2 diabetes mellitus without complications: Secondary | ICD-10-CM

## 2020-09-04 DIAGNOSIS — B182 Chronic viral hepatitis C: Secondary | ICD-10-CM | POA: Diagnosis present

## 2020-09-04 DIAGNOSIS — Z981 Arthrodesis status: Secondary | ICD-10-CM | POA: Diagnosis not present

## 2020-09-04 DIAGNOSIS — F10239 Alcohol dependence with withdrawal, unspecified: Secondary | ICD-10-CM | POA: Diagnosis not present

## 2020-09-04 DIAGNOSIS — I252 Old myocardial infarction: Secondary | ICD-10-CM

## 2020-09-04 DIAGNOSIS — I428 Other cardiomyopathies: Secondary | ICD-10-CM | POA: Diagnosis present

## 2020-09-04 DIAGNOSIS — Z79899 Other long term (current) drug therapy: Secondary | ICD-10-CM

## 2020-09-04 DIAGNOSIS — F17210 Nicotine dependence, cigarettes, uncomplicated: Secondary | ICD-10-CM | POA: Diagnosis present

## 2020-09-04 DIAGNOSIS — F329 Major depressive disorder, single episode, unspecified: Secondary | ICD-10-CM | POA: Diagnosis present

## 2020-09-04 DIAGNOSIS — K7030 Alcoholic cirrhosis of liver without ascites: Secondary | ICD-10-CM | POA: Diagnosis present

## 2020-09-04 DIAGNOSIS — Z794 Long term (current) use of insulin: Secondary | ICD-10-CM | POA: Diagnosis not present

## 2020-09-04 DIAGNOSIS — Z8546 Personal history of malignant neoplasm of prostate: Secondary | ICD-10-CM

## 2020-09-04 LAB — COMPREHENSIVE METABOLIC PANEL
ALT: 16 U/L (ref 0–44)
AST: 22 U/L (ref 15–41)
Albumin: 3 g/dL — ABNORMAL LOW (ref 3.5–5.0)
Alkaline Phosphatase: 50 U/L (ref 38–126)
Anion gap: 14 (ref 5–15)
BUN: 19 mg/dL (ref 6–20)
CO2: 19 mmol/L — ABNORMAL LOW (ref 22–32)
Calcium: 8.3 mg/dL — ABNORMAL LOW (ref 8.9–10.3)
Chloride: 79 mmol/L — ABNORMAL LOW (ref 98–111)
Creatinine, Ser: 1.19 mg/dL (ref 0.61–1.24)
GFR calc Af Amer: >60 mL/min (ref >60)
GFR calc non Af Amer: >60 mL/min (ref >60)
Glucose, Bld: 128 mg/dL — ABNORMAL HIGH (ref 70–99)
Potassium: 4.1 mmol/L (ref 3.5–5.1)
Sodium: 112 mmol/L — CL (ref 135–145)
Total Bilirubin: 0.5 mg/dL (ref 0.3–1.2)
Total Protein: 6.7 g/dL (ref 6.5–8.1)

## 2020-09-04 LAB — CBC WITH DIFFERENTIAL/PLATELET
Abs Immature Granulocytes: 0.01 10*3/uL (ref 0.00–0.07)
Basophils Absolute: 0 10*3/uL (ref 0.0–0.1)
Basophils Relative: 1 %
Eosinophils Absolute: 0.1 10*3/uL (ref 0.0–0.5)
Eosinophils Relative: 1 %
HCT: 32.7 % — ABNORMAL LOW (ref 39.0–52.0)
Hemoglobin: 11.2 g/dL — ABNORMAL LOW (ref 13.0–17.0)
Immature Granulocytes: 0 %
Lymphocytes Relative: 10 %
Lymphs Abs: 0.4 10*3/uL — ABNORMAL LOW (ref 0.7–4.0)
MCH: 29.5 pg (ref 26.0–34.0)
MCHC: 34.3 g/dL (ref 30.0–36.0)
MCV: 86.1 fL (ref 80.0–100.0)
Monocytes Absolute: 0.9 10*3/uL (ref 0.1–1.0)
Monocytes Relative: 21 %
Neutro Abs: 2.8 10*3/uL (ref 1.7–7.7)
Neutrophils Relative %: 67 %
Platelets: 284 10*3/uL (ref 150–400)
RBC: 3.8 MIL/uL — ABNORMAL LOW (ref 4.22–5.81)
RDW: 14.6 % (ref 11.5–15.5)
WBC: 4.2 10*3/uL (ref 4.0–10.5)
nRBC: 0 % (ref 0.0–0.2)

## 2020-09-04 LAB — TROPONIN I (HIGH SENSITIVITY)
Troponin I (High Sensitivity): 41 ng/L — ABNORMAL HIGH (ref <18)
Troponin I (High Sensitivity): 38 ng/L — ABNORMAL HIGH (ref <18)

## 2020-09-04 LAB — CBG MONITORING, ED
Glucose-Capillary: 118 mg/dL — ABNORMAL HIGH (ref 70–99)
Glucose-Capillary: 155 mg/dL — ABNORMAL HIGH (ref 70–99)

## 2020-09-04 LAB — BRAIN NATRIURETIC PEPTIDE: B Natriuretic Peptide: 494.9 pg/mL — ABNORMAL HIGH (ref 0.0–100.0)

## 2020-09-04 LAB — RAPID URINE DRUG SCREEN, HOSP PERFORMED
Amphetamines: NOT DETECTED
Barbiturates: NOT DETECTED
Benzodiazepines: NOT DETECTED
Cocaine: NOT DETECTED
Opiates: POSITIVE — AB
Tetrahydrocannabinol: NOT DETECTED

## 2020-09-04 LAB — SARS CORONAVIRUS 2 BY RT PCR (HOSPITAL ORDER, PERFORMED IN ~~LOC~~ HOSPITAL LAB): SARS Coronavirus 2: NEGATIVE

## 2020-09-04 LAB — DIGOXIN LEVEL: Digoxin Level: 0.5 ng/mL — ABNORMAL LOW (ref 0.8–2.0)

## 2020-09-04 MED ORDER — NICOTINE 14 MG/24HR TD PT24
14.0000 mg | MEDICATED_PATCH | Freq: Every day | TRANSDERMAL | Status: DC
  Administered 2020-09-05 – 2020-09-09 (×5): 14 mg via TRANSDERMAL
  Filled 2020-09-04 (×6): qty 1

## 2020-09-04 MED ORDER — DIGOXIN 125 MCG PO TABS
0.1250 mg | ORAL_TABLET | Freq: Every day | ORAL | Status: DC
  Administered 2020-09-04: 0.125 mg via ORAL
  Filled 2020-09-04 (×2): qty 1

## 2020-09-04 MED ORDER — ATORVASTATIN CALCIUM 20 MG PO TABS
20.0000 mg | ORAL_TABLET | Freq: Every day | ORAL | Status: DC
  Administered 2020-09-04 – 2020-09-09 (×6): 20 mg via ORAL
  Filled 2020-09-04 (×2): qty 1
  Filled 2020-09-04: qty 2
  Filled 2020-09-04 (×3): qty 1

## 2020-09-04 MED ORDER — OXYCODONE-ACETAMINOPHEN 5-325 MG PO TABS
1.0000 | ORAL_TABLET | Freq: Four times a day (QID) | ORAL | Status: DC | PRN
  Administered 2020-09-05 (×3): 1 via ORAL
  Filled 2020-09-04 (×3): qty 1

## 2020-09-04 MED ORDER — FOLIC ACID 1 MG PO TABS
1.0000 mg | ORAL_TABLET | Freq: Every day | ORAL | Status: DC
  Administered 2020-09-04 – 2020-09-10 (×7): 1 mg via ORAL
  Filled 2020-09-04 (×6): qty 1

## 2020-09-04 MED ORDER — ACETAMINOPHEN 650 MG RE SUPP: 650.0000 mg | Freq: Four times a day (QID) | RECTAL | Status: DC | PRN

## 2020-09-04 MED ORDER — SODIUM CHLORIDE 0.9% FLUSH
3.0000 mL | Freq: Two times a day (BID) | INTRAVENOUS | Status: DC
  Administered 2020-09-04 – 2020-09-10 (×13): 3 mL via INTRAVENOUS

## 2020-09-04 MED ORDER — ADULT MULTIVITAMIN W/MINERALS CH
1.0000 | ORAL_TABLET | Freq: Every day | ORAL | Status: DC
  Administered 2020-09-04 – 2020-09-10 (×7): 1 via ORAL
  Filled 2020-09-04 (×7): qty 1

## 2020-09-04 MED ORDER — THIAMINE HCL 100 MG PO TABS
100.0000 mg | ORAL_TABLET | Freq: Every day | ORAL | Status: DC
  Administered 2020-09-05 – 2020-09-10 (×6): 100 mg via ORAL
  Filled 2020-09-04 (×6): qty 1

## 2020-09-04 MED ORDER — INSULIN ASPART 100 UNIT/ML ~~LOC~~ SOLN
0.0000 [IU] | Freq: Every day | SUBCUTANEOUS | Status: DC
  Filled 2020-09-04: qty 0.05

## 2020-09-04 MED ORDER — ACETAMINOPHEN 325 MG PO TABS
650.0000 mg | ORAL_TABLET | Freq: Four times a day (QID) | ORAL | Status: DC | PRN
  Administered 2020-09-05 – 2020-09-06 (×3): 650 mg via ORAL
  Filled 2020-09-04 (×4): qty 2

## 2020-09-04 MED ORDER — LORAZEPAM 1 MG PO TABS: 1.0000 mg | ORAL_TABLET | ORAL | Status: AC | PRN

## 2020-09-04 MED ORDER — THIAMINE HCL 100 MG/ML IJ SOLN
100.0000 mg | Freq: Every day | INTRAMUSCULAR | Status: DC
  Administered 2020-09-04: 100 mg via INTRAVENOUS
  Filled 2020-09-04 (×2): qty 2

## 2020-09-04 MED ORDER — ASPIRIN 81 MG PO CHEW
81.0000 mg | CHEWABLE_TABLET | Freq: Every day | ORAL | Status: DC
  Administered 2020-09-04 – 2020-09-10 (×7): 81 mg via ORAL
  Filled 2020-09-04 (×7): qty 1

## 2020-09-04 MED ORDER — AMIODARONE HCL 200 MG PO TABS
200.0000 mg | ORAL_TABLET | Freq: Every day | ORAL | Status: DC
  Administered 2020-09-04 – 2020-09-05 (×2): 200 mg via ORAL
  Filled 2020-09-04 (×2): qty 1

## 2020-09-04 MED ORDER — LORAZEPAM 2 MG/ML IJ SOLN
1.0000 mg | INTRAMUSCULAR | Status: AC | PRN
  Administered 2020-09-04: 2 mg via INTRAVENOUS
  Administered 2020-09-04: 3 mg via INTRAVENOUS
  Administered 2020-09-05: 2 mg via INTRAVENOUS
  Administered 2020-09-07: 1 mg via INTRAVENOUS
  Administered 2020-09-07: 2 mg via INTRAVENOUS
  Filled 2020-09-04: qty 1
  Filled 2020-09-04: qty 2
  Filled 2020-09-04 (×2): qty 1
  Filled 2020-09-04: qty 2

## 2020-09-04 MED ORDER — SPIRONOLACTONE 12.5 MG HALF TABLET
12.5000 mg | ORAL_TABLET | Freq: Every day | ORAL | Status: DC
  Administered 2020-09-04 – 2020-09-06 (×3): 12.5 mg via ORAL
  Filled 2020-09-04 (×4): qty 1

## 2020-09-04 MED ORDER — INSULIN ASPART 100 UNIT/ML ~~LOC~~ SOLN
0.0000 [IU] | Freq: Three times a day (TID) | SUBCUTANEOUS | Status: DC
  Administered 2020-09-05: 8 [IU] via SUBCUTANEOUS
  Administered 2020-09-05: 3 [IU] via SUBCUTANEOUS
  Administered 2020-09-06: 2 [IU] via SUBCUTANEOUS
  Administered 2020-09-06 – 2020-09-07 (×3): 3 [IU] via SUBCUTANEOUS
  Administered 2020-09-07: 2 [IU] via SUBCUTANEOUS
  Administered 2020-09-07: 3 [IU] via SUBCUTANEOUS
  Administered 2020-09-08 – 2020-09-10 (×8): 2 [IU] via SUBCUTANEOUS
  Filled 2020-09-04: qty 0.15

## 2020-09-04 MED ORDER — GABAPENTIN 300 MG PO CAPS
600.0000 mg | ORAL_CAPSULE | Freq: Two times a day (BID) | ORAL | Status: DC
  Administered 2020-09-04 – 2020-09-06 (×5): 600 mg via ORAL
  Filled 2020-09-04 (×5): qty 2

## 2020-09-04 MED ORDER — MORPHINE SULFATE (PF) 4 MG/ML IV SOLN
6.0000 mg | Freq: Once | INTRAVENOUS | Status: AC
  Administered 2020-09-04: 6 mg via INTRAVENOUS
  Filled 2020-09-04: qty 2

## 2020-09-04 MED ORDER — ENOXAPARIN SODIUM 40 MG/0.4ML ~~LOC~~ SOLN
40.0000 mg | SUBCUTANEOUS | Status: DC
  Administered 2020-09-04 – 2020-09-09 (×6): 40 mg via SUBCUTANEOUS
  Filled 2020-09-04 (×7): qty 0.4

## 2020-09-04 MED ORDER — ONDANSETRON HCL 4 MG/2ML IJ SOLN
4.0000 mg | Freq: Four times a day (QID) | INTRAMUSCULAR | Status: DC | PRN
  Administered 2020-09-04 – 2020-09-07 (×7): 4 mg via INTRAVENOUS
  Filled 2020-09-04 (×7): qty 2

## 2020-09-04 MED ORDER — SODIUM CHLORIDE 0.9 % IV SOLN: INTRAVENOUS | Status: DC

## 2020-09-04 MED ORDER — ISOSORB DINITRATE-HYDRALAZINE 20-37.5 MG PO TABS
1.0000 | ORAL_TABLET | Freq: Three times a day (TID) | ORAL | Status: DC
  Administered 2020-09-04 – 2020-09-10 (×18): 1 via ORAL
  Filled 2020-09-04 (×21): qty 1
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
## 2020-09-04 NOTE — ED Triage Notes (Signed)
Pt arrives via EMS from home with c/o neck pain and shob at that gets worse when he lays down. Hx chf, dm, htn, afib, and previous neck surgery.
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
## 2020-09-04 NOTE — Progress Notes (Signed)
Called because patient status post ACDF C4-6 July 24, 2020 and he went to the emergency department due to a fall and had some more tingling/paresthesias but his motor strength is full.  I asked the emergency provider to get AP and lateral x-rays.  He had significant myelopathy preop with paresthesias and is going to have paresthesias intermittently for months if not permanently. His motor strength is full per the ER MD.  Cervical x-rays reviewed, no malalignment of hardware or change from previous imaging.  Prevertebral swelling has come down compared to prior.  Unless any other significant neurologic change, I see no reasoning for further imaging at this time. If his pain persists and/or he has new neurologic changes advanced imaging may be beneficial.  Please call with any questions or concerns.  Thank you for allowing me to participate in this patient's care.  Please do not hesitate to call with questions or concerns.   Elwin Sleight, [facility] Cell: [phone]

## 2020-09-04 NOTE — ED Notes (Signed)
Patient states that he drinks 3-40 ounce beers a day.

## 2020-09-05 ENCOUNTER — Telehealth (HOSPITAL_COMMUNITY): Payer: Self-pay | Admitting: Licensed Clinical Social Worker

## 2020-09-05 DIAGNOSIS — E1159 Type 2 diabetes mellitus with other circulatory complications: Secondary | ICD-10-CM

## 2020-09-05 DIAGNOSIS — F1029 Alcohol dependence with unspecified alcohol-induced disorder: Secondary | ICD-10-CM

## 2020-09-05 DIAGNOSIS — Z794 Long term (current) use of insulin: Secondary | ICD-10-CM

## 2020-09-05 DIAGNOSIS — I10 Essential (primary) hypertension: Secondary | ICD-10-CM

## 2020-09-05 DIAGNOSIS — R079 Chest pain, unspecified: Secondary | ICD-10-CM

## 2020-09-05 DIAGNOSIS — I5042 Chronic combined systolic (congestive) and diastolic (congestive) heart failure: Secondary | ICD-10-CM

## 2020-09-05 DIAGNOSIS — N1831 Chronic kidney disease, stage 3a: Secondary | ICD-10-CM

## 2020-09-05 DIAGNOSIS — I482 Chronic atrial fibrillation, unspecified: Secondary | ICD-10-CM

## 2020-09-05 DIAGNOSIS — Z720 Tobacco use: Secondary | ICD-10-CM

## 2020-09-05 DIAGNOSIS — E785 Hyperlipidemia, unspecified: Secondary | ICD-10-CM

## 2020-09-05 LAB — BASIC METABOLIC PANEL
Anion gap: 7 (ref 5–15)
Anion gap: 6 (ref 5–15)
Anion gap: 8 (ref 5–15)
Anion gap: 8 (ref 5–15)
BUN: 24 mg/dL — ABNORMAL HIGH (ref 6–20)
BUN: 25 mg/dL — ABNORMAL HIGH (ref 6–20)
BUN: 24 mg/dL — ABNORMAL HIGH (ref 6–20)
BUN: 24 mg/dL — ABNORMAL HIGH (ref 6–20)
CO2: 26 mmol/L (ref 22–32)
CO2: 26 mmol/L (ref 22–32)
CO2: 26 mmol/L (ref 22–32)
CO2: 27 mmol/L (ref 22–32)
Calcium: 8.5 mg/dL — ABNORMAL LOW (ref 8.9–10.3)
Calcium: 8.7 mg/dL — ABNORMAL LOW (ref 8.9–10.3)
Calcium: 8.6 mg/dL — ABNORMAL LOW (ref 8.9–10.3)
Calcium: 8.6 mg/dL — ABNORMAL LOW (ref 8.9–10.3)
Chloride: 88 mmol/L — ABNORMAL LOW (ref 98–111)
Chloride: 91 mmol/L — ABNORMAL LOW (ref 98–111)
Chloride: 89 mmol/L — ABNORMAL LOW (ref 98–111)
Chloride: 87 mmol/L — ABNORMAL LOW (ref 98–111)
Creatinine, Ser: 1.29 mg/dL — ABNORMAL HIGH (ref 0.61–1.24)
Creatinine, Ser: 1.37 mg/dL — ABNORMAL HIGH (ref 0.61–1.24)
Creatinine, Ser: 1.37 mg/dL — ABNORMAL HIGH (ref 0.61–1.24)
Creatinine, Ser: 1.41 mg/dL — ABNORMAL HIGH (ref 0.61–1.24)
GFR calc Af Amer: >60 mL/min (ref >60)
GFR calc Af Amer: >60 mL/min (ref >60)
GFR calc Af Amer: >60 mL/min (ref >60)
GFR calc Af Amer: >60 mL/min (ref >60)
GFR calc non Af Amer: 60 mL/min — ABNORMAL LOW (ref >60)
GFR calc non Af Amer: 56 mL/min — ABNORMAL LOW (ref >60)
GFR calc non Af Amer: 56 mL/min — ABNORMAL LOW (ref >60)
GFR calc non Af Amer: 54 mL/min — ABNORMAL LOW (ref >60)
Glucose, Bld: 170 mg/dL — ABNORMAL HIGH (ref 70–99)
Glucose, Bld: 162 mg/dL — ABNORMAL HIGH (ref 70–99)
Glucose, Bld: 166 mg/dL — ABNORMAL HIGH (ref 70–99)
Glucose, Bld: 107 mg/dL — ABNORMAL HIGH (ref 70–99)
Potassium: 4 mmol/L (ref 3.5–5.1)
Potassium: 4.3 mmol/L (ref 3.5–5.1)
Potassium: 4.8 mmol/L (ref 3.5–5.1)
Potassium: 4.4 mmol/L (ref 3.5–5.1)
Sodium: 121 mmol/L — ABNORMAL LOW (ref 135–145)
Sodium: 123 mmol/L — ABNORMAL LOW (ref 135–145)
Sodium: 123 mmol/L — ABNORMAL LOW (ref 135–145)
Sodium: 122 mmol/L — ABNORMAL LOW (ref 135–145)

## 2020-09-05 LAB — CBC
HCT: 32.6 % — ABNORMAL LOW (ref 39.0–52.0)
Hemoglobin: 11.1 g/dL — ABNORMAL LOW (ref 13.0–17.0)
MCH: 29.4 pg (ref 26.0–34.0)
MCHC: 34 g/dL (ref 30.0–36.0)
MCV: 86.5 fL (ref 80.0–100.0)
Platelets: 252 10*3/uL (ref 150–400)
RBC: 3.77 MIL/uL — ABNORMAL LOW (ref 4.22–5.81)
RDW: 14.5 % (ref 11.5–15.5)
WBC: 3.9 10*3/uL — ABNORMAL LOW (ref 4.0–10.5)
nRBC: 0 % (ref 0.0–0.2)

## 2020-09-05 LAB — GLUCOSE, CAPILLARY
Glucose-Capillary: 255 mg/dL — ABNORMAL HIGH (ref 70–99)
Glucose-Capillary: 341 mg/dL — ABNORMAL HIGH (ref 70–99)
Glucose-Capillary: 98 mg/dL (ref 70–99)

## 2020-09-05 LAB — CBG MONITORING, ED
Glucose-Capillary: 176 mg/dL — ABNORMAL HIGH (ref 70–99)
Glucose-Capillary: 114 mg/dL — ABNORMAL HIGH (ref 70–99)

## 2020-09-05 MED ORDER — HYDROCODONE-ACETAMINOPHEN 7.5-325 MG PO TABS
1.0000 | ORAL_TABLET | Freq: Four times a day (QID) | ORAL | Status: DC | PRN
  Administered 2020-09-05 – 2020-09-06 (×4): 1 via ORAL
  Filled 2020-09-05 (×4): qty 1

## 2020-09-05 MED ORDER — SODIUM CHLORIDE 0.45 % IV SOLN: INTRAVENOUS | Status: AC

## 2020-09-05 NOTE — Telephone Encounter (Signed)
Pt called CSW to inform he is back in the hospital with low sodium. Also informed CSW that he was contacted by the Housing authority and was able to tour an apartment yesterday.  Pt currently without a working phone so he requested CSW assist him in following up with his Editor, commissioning to see what the next steps are as far as moving in.  CSW emailed pt case worker to discuss- awaiting return message  [name], [facility] Desk#: [phone] Cell#: [phone]

## 2020-09-05 NOTE — Progress Notes (Addendum)
PROGRESS NOTE  Brad [name]  VEL:[id number] DOB: 1961/05/26 DOA: 09/04/2020 PCP: Charlott Rakes, MD   Brief Narrative: Brad [name] is a 59 y.o. male with medical history significant of ETOH abuse, Afib, CHF, HTN withrecurrent acute onchronic hyponatremia presenting with the same (12 admissions and 5 ER visits in the last 6 months for various reasons). He was last admitted from6/8-15 for acute on chronic systolic CHF with hypervolemic hyponatremia associated with CHF and chronic beer potomania.  He reports that he has had numbness in his hands and feet and was having difficulty walking.  He had surgery about 6 weeks ago and his posterior neck was hurting.  His chest was also hurting.  The pain was similar to prior episodes of low sodium.  His appetite has been gone.  He is nauseated, but this just started.  He hasn't been able to eat and drink the last few days.  He has been drinking "not that much" this week.  He feels like he is "jumping a lot", myoclonic jerks (also present with hyponatremia in August).  He was last admitted from 8/30-9/2 for beer potomania with Na++ 116, treated with IVF and diuretics held.  He was given IV fluids for sodium level of 112 and admitted. Neurosurgery felt no further evaluation or management was required for paresthesias.   Assessment & Plan: Principal Problem:   Hyponatremia with normal extracellular fluid volume Active Problems:   DM type 2 (diabetes mellitus, type 2) (HCC)   CKD (chronic kidney disease) stage 3, GFR 30-59 ml/min   Chronic atrial fibrillation   Alcohol dependence syndrome (HCC)   Tobacco abuse   Chest pain   HTN (hypertension)   HLD (hyperlipidemia)   Chronic combined systolic and diastolic CHF (congestive heart failure) (HCC)  Hyponatremia: Most likely acute on chronic and hypovolemic with decreased solute intake and continued diuretic use. Overly rapid correction over past 24 hours (57mEq/L) just with modest isotonic  saline - Will give 1L hypotonic saline over the course of the day. Continue serial measurements and modify Tx accordingly.  - Remains appropriate for progressive care at this time.  - Generally, will continue fluid restriction.   Alcohol abuse:  - Continue CIWA protocol.   Bradycardia:  - Hold digoxin and amiodarone for now.   Myoclonic jerks: Chronic problem, worse with hyponatremia and currently improving.   Paresthesias: This may be exacerbated by severe hyponatremia. s/p ACDF C4-C6 Jul 24, 2020, paresthesias were present prior and may continue long-term. Motor function intact. - Dr. Reatha Armour has written a note and does not suggest further evaluation/treatment - Continue Neurontin  Chronic combined HFrEF: LVEF 35-40% in April 2021.  - Holding torsemide with hypovolemic hyponatremia, likely to restart if taking adequate po on 9/16.  - Continue low dose spironolactone for now.   Chronic AFib with bradycardic ventricular response:  - Amiodarone was continued. will have to hold this and digoxin with bradycardia.  - Not on anticoagulation reportedly due to high fall risk, ongoing EtOH abuse   Chest pain: Mostly related to keloid on anterior chest. Unremarkable CXR, troponin chronically slightly elevated with downward trend and no ischemic ECG changes to suggest ACS.  - Continue bidil  HLD:  - Continue statin  EtOH dependence:  - CIWA protocol.  - At higher risk for seizures with severe electrolyte derangement.   Tobacco use:  - Cessation counseling provided.   Hepatic cirrhosis: Due to alcohol abuse and also has chronic hepatitis C.  - Restart diuretics when able.  Stage IIIa CKD:  - Avoid nephrotoxins.  T2DM:  - SSI  DVT prophylaxis: Lovenox Code Status: Full Family Communication: None at bedside Disposition Plan:  Status is: Inpatient  Remains inpatient appropriate because:Inpatient level of care appropriate due to severity of illness   Dispo: The patient is  from: Home              Anticipated d/c is to: Home              Anticipated d/c date is: 1 day              Patient currently is not medically stable to d/c.  Consultants:   None  Procedures:   None  Antimicrobials:  None   Subjective: Paresthesias in hands and feet are slightly improved. Jerking movements mostly in legs are improved as well. He wants ativan. Does not report chest pain this AM.   Objective: Vitals:   09/05/20 0401 09/05/20 0500 09/05/20 0600 09/05/20 0837  BP: 129/61 117/80 130/82 134/85  Pulse: (!) 54 65 (!) 55 (!) 53  Resp: (!) 22 18 14 15   Temp:      TempSrc:      SpO2: 98% 99% 97% 97%  Weight:      Height:        Intake/Output Summary (Last 24 hours) at 09/05/2020 1001 Last data filed at 09/05/2020 0028 Gross per 24 hour  Intake 1003 ml  Output --  Net 1003 ml   Filed Weights   09/04/20 0635  Weight: 77.6 kg    Gen: Chronically ill-appearing male in no distress Pulm: Non-labored breathing. Clear to auscultation bilaterally.  CV: Bardycardic. No murmur, rub, or gallop. No JVD, no pitting pedal edema. GI: Abdomen soft, non-tender, non-distended, with normoactive bowel sounds. No organomegaly or masses felt. Ext: Warm, no deformities Skin: No rashes, lesions or ulcers Neuro: Alert and oriented. No focal neurological deficits. Psych: Judgement and insight appear normal. Mood & affect appropriate.   Data Reviewed: I have personally reviewed following labs and imaging studies  CBC: Recent Labs  Lab 09/04/20 0649 09/05/20 0640  WBC 4.2 3.9*  NEUTROABS 2.8  --   HGB 11.2* 11.1*  HCT 32.7* 32.6*  MCV 86.1 86.5  PLT 284 324   Basic Metabolic Panel: Recent Labs  Lab 09/04/20 0649 09/05/20 0157 09/05/20 0640  NA 112* 121* 123*  K 4.1 4.0 4.3  CL 79* 88* 91*  CO2 19* 26 26  GLUCOSE 128* 170* 162*  BUN 19 24* 25*  CREATININE 1.19 1.29* 1.37*  CALCIUM 8.3* 8.5* 8.7*   GFR: Estimated Creatinine Clearance: 61.1 mL/min (A) (by C-G  formula based on SCr of 1.37 mg/dL (H)). Liver Function Tests: Recent Labs  Lab 09/04/20 0649  AST 22  ALT 16  ALKPHOS 50  BILITOT 0.5  PROT 6.7  ALBUMIN 3.0*   No results for input(s): LIPASE, AMYLASE in the last 168 hours. No results for input(s): AMMONIA in the last 168 hours. Coagulation Profile: No results for input(s): INR, PROTIME in the last 168 hours. Cardiac Enzymes: No results for input(s): CKTOTAL, CKMB, CKMBINDEX, TROPONINI in the last 168 hours. BNP (last 3 results) No results for input(s): PROBNP in the last 8760 hours. HbA1C: No results for input(s): HGBA1C in the last 72 hours. CBG: Recent Labs  Lab 09/04/20 1720 09/04/20 2116 09/05/20 0842  GLUCAP 118* 155* 176*   Lipid Profile: No results for input(s): CHOL, HDL, LDLCALC, TRIG, CHOLHDL, LDLDIRECT in the last 72 hours.  Thyroid Function Tests: No results for input(s): TSH, T4TOTAL, FREET4, T3FREE, THYROIDAB in the last 72 hours. Anemia Panel: No results for input(s): VITAMINB12, FOLATE, FERRITIN, TIBC, IRON, RETICCTPCT in the last 72 hours. Urine analysis:    Component Value Date/Time   COLORURINE YELLOW 08/16/2020 1155   APPEARANCEUR CLEAR 08/16/2020 1155   LABSPEC 1.005 08/16/2020 1155   PHURINE 5.0 08/16/2020 1155   GLUCOSEU NEGATIVE 08/16/2020 1155   HGBUR SMALL (A) 08/16/2020 1155   BILIRUBINUR NEGATIVE 08/16/2020 1155   BILIRUBINUR neg 05/08/2017 1503   KETONESUR NEGATIVE 08/16/2020 1155   PROTEINUR 100 (A) 08/16/2020 1155   UROBILINOGEN 0.2 05/08/2017 1503   UROBILINOGEN 1.0 04/26/2012 1328   NITRITE NEGATIVE 08/16/2020 1155   LEUKOCYTESUR NEGATIVE 08/16/2020 1155   Recent Results (from the past 240 hour(s))  SARS Coronavirus 2 by RT PCR (hospital order, performed in [facility] hospital lab) Nasopharyngeal Nasopharyngeal Swab     Status: None   Collection Time: 09/04/20  9:07 AM   Specimen: Nasopharyngeal Swab  Result Value Ref Range Status   SARS Coronavirus 2 NEGATIVE NEGATIVE  Final    Comment: (NOTE) SARS-CoV-2 target nucleic acids are NOT DETECTED.  The SARS-CoV-2 RNA is generally detectable in upper and lower respiratory specimens during the acute phase of infection. The lowest concentration of SARS-CoV-2 viral copies this assay can detect is 250 copies / mL. A negative result does not preclude SARS-CoV-2 infection and should not be used as the sole basis for treatment or other patient management decisions.  A negative result may occur with improper specimen collection / handling, submission of specimen other than nasopharyngeal swab, presence of viral mutation(s) within the areas targeted by this assay, and inadequate number of viral copies (<250 copies / mL). A negative result must be combined with clinical observations, patient history, and epidemiological information.  Fact Sheet for Patients:   StrictlyIdeas.no  Fact Sheet for Healthcare Providers: BankingDealers.co.za  This test is not yet approved or  cleared by the [place] FDA and has been authorized for detection and/or diagnosis of SARS-CoV-2 by FDA under an Emergency Use Authorization (EUA).  This EUA will remain in effect (meaning this test can be used) for the duration of the COVID-19 declaration under Section 564(b)(1) of the Act, 21 U.S.C. section 360bbb-3(b)(1), unless the authorization is terminated or revoked sooner.  Performed at [facility], [place] [address], [place], [place] [address]       Radiology Studies: DG Chest 2 View  Result Date: 09/04/2020 CLINICAL DATA:  Shortness of breath EXAM: CHEST - 2 VIEW COMPARISON:  08/22/2020 FINDINGS: Cardiomegaly. There is no edema, consolidation, effusion, or pneumothorax. No acute osseous finding. IMPRESSION: No acute finding. Chronic cardiomegaly. Electronically Signed   By: Monte Fantasia M.D.   On: 09/04/2020 07:12   DG Cervical Spine 2-3 View Clearing  Result  Date: 09/04/2020 CLINICAL DATA:  Fall at home complaining of neck pain and shortness of breath. EXAM: LIMITED CERVICAL SPINE FOR TRAUMA CLEARING - 2-3 VIEW COMPARISON:  July 30, 2020 FINDINGS: Decreased prevertebral soft tissue swelling when compared to postoperative radiograph from August 20, 2020. Cervical spine with limited assessment of the cervicothoracic junction on today's exam only the superior most aspect of the T1 vertebral body is imaged. No sign of static subluxation.  No visible fracture. Limited assessment also of the upper cervical spine on the AP view, no open-mouth view is provided. IMPRESSION: 1. Decreased prevertebral soft tissue swelling when compared to postoperative radiograph from August 20, 2020. 2. No signs of fracture or static subluxation, limited assessment of the craniocervical junction and cervicothoracic junction as described. Electronically Signed   By: Zetta Bills M.D.   On: 09/04/2020 10:38   DG Finger Thumb Right  Result Date: 09/04/2020 CLINICAL DATA:  [place] several days ago with pain and swelling. EXAM: RIGHT THUMB 2+V COMPARISON:  08/16/2020 FINDINGS: Redemonstration of a comminuted fracture of the distal phalanx of the thumb and of a comminuted fracture at the base of the proximal phalanx of the thumb with intra-articular extension. No change in position or alignment. IMPRESSION: Comminuted fractures of the distal phalanx and proximal phalanx of the thumb. No change in position or alignment. Electronically Signed   By: Nelson Chimes M.D.   On: 09/04/2020 12:37    Scheduled Meds: . amiodarone  200 mg Oral Daily  . aspirin  81 mg Oral Daily  . atorvastatin  20 mg Oral q1800  . digoxin  0.125 mg Oral Daily  . enoxaparin (LOVENOX) injection  40 mg Subcutaneous Q24H  . folic acid  1 mg Oral Daily  . gabapentin  600 mg Oral BID  . insulin aspart  0-15 Units Subcutaneous TID WC  . insulin aspart  0-5 Units Subcutaneous QHS  . isosorbide-hydrALAZINE  1 tablet Oral  TID  . multivitamin with minerals  1 tablet Oral Daily  . nicotine  14 mg Transdermal Daily  . sodium chloride flush  3 mL Intravenous Q12H  . spironolactone  12.5 mg Oral Daily  . thiamine  100 mg Oral Daily   Or  . thiamine  100 mg Intravenous Daily   Continuous Infusions: . sodium chloride       LOS: 1 day   Time spent: 35 minutes.  Patrecia Pour, MD [facility] [email or web address] 09/05/2020, 10:01 AM

## 2020-09-06 LAB — BASIC METABOLIC PANEL
Anion gap: 8 (ref 5–15)
Anion gap: 8 (ref 5–15)
BUN: 22 mg/dL — ABNORMAL HIGH (ref 6–20)
BUN: 21 mg/dL — ABNORMAL HIGH (ref 6–20)
CO2: 23 mmol/L (ref 22–32)
CO2: 25 mmol/L (ref 22–32)
Calcium: 8.3 mg/dL — ABNORMAL LOW (ref 8.9–10.3)
Calcium: 8.8 mg/dL — ABNORMAL LOW (ref 8.9–10.3)
Chloride: 87 mmol/L — ABNORMAL LOW (ref 98–111)
Chloride: 87 mmol/L — ABNORMAL LOW (ref 98–111)
Creatinine, Ser: 1.27 mg/dL — ABNORMAL HIGH (ref 0.61–1.24)
Creatinine, Ser: 1.35 mg/dL — ABNORMAL HIGH (ref 0.61–1.24)
GFR calc Af Amer: >60 mL/min (ref >60)
GFR calc Af Amer: >60 mL/min (ref >60)
GFR calc non Af Amer: >60 mL/min (ref >60)
GFR calc non Af Amer: 57 mL/min — ABNORMAL LOW (ref >60)
Glucose, Bld: 122 mg/dL — ABNORMAL HIGH (ref 70–99)
Glucose, Bld: 148 mg/dL — ABNORMAL HIGH (ref 70–99)
Potassium: 4.5 mmol/L (ref 3.5–5.1)
Potassium: 4.9 mmol/L (ref 3.5–5.1)
Sodium: 118 mmol/L — CL (ref 135–145)
Sodium: 120 mmol/L — ABNORMAL LOW (ref 135–145)

## 2020-09-06 LAB — GLUCOSE, CAPILLARY
Glucose-Capillary: 149 mg/dL — ABNORMAL HIGH (ref 70–99)
Glucose-Capillary: 151 mg/dL — ABNORMAL HIGH (ref 70–99)
Glucose-Capillary: 178 mg/dL — ABNORMAL HIGH (ref 70–99)
Glucose-Capillary: 143 mg/dL — ABNORMAL HIGH (ref 70–99)

## 2020-09-06 MED ORDER — OXYCODONE HCL 5 MG PO TABS
5.0000 mg | ORAL_TABLET | ORAL | Status: DC | PRN
  Administered 2020-09-06 – 2020-09-08 (×9): 10 mg via ORAL
  Administered 2020-09-08: 5 mg via ORAL
  Administered 2020-09-08 – 2020-09-10 (×10): 10 mg via ORAL
  Filled 2020-09-06 (×13): qty 2
  Filled 2020-09-06: qty 1
  Filled 2020-09-06 (×6): qty 2

## 2020-09-06 MED ORDER — LOPERAMIDE HCL 2 MG PO CAPS
4.0000 mg | ORAL_CAPSULE | Freq: Once | ORAL | Status: AC
  Administered 2020-09-06: 4 mg via ORAL
  Filled 2020-09-06: qty 2

## 2020-09-06 MED ORDER — GABAPENTIN 300 MG PO CAPS
600.0000 mg | ORAL_CAPSULE | Freq: Three times a day (TID) | ORAL | Status: DC
  Administered 2020-09-06 – 2020-09-10 (×12): 600 mg via ORAL
  Filled 2020-09-06 (×12): qty 2

## 2020-09-06 MED ORDER — SODIUM CHLORIDE 0.9 % IV SOLN: INTRAVENOUS | Status: DC

## 2020-09-06 NOTE — Progress Notes (Signed)
PROGRESS NOTE  [name]  JJK:[id number] DOB: 10-24-1961 DOA: 09/04/2020 PCP: Charlott Rakes, MD   Brief Narrative: Brad [name] is a 58 y.o. male with medical history significant of ETOH abuse, Afib, CHF, HTN withrecurrent acute onchronic hyponatremia presenting with the same (12 admissions and 5 ER visits in the last 6 months for various reasons). He was last admitted from6/8-15 for acute on chronic systolic CHF with hypervolemic hyponatremia associated with CHF and chronic beer potomania.  He reports that he has had numbness in his hands and feet and was having difficulty walking.  He had surgery about 6 weeks ago and his posterior neck was hurting.  His chest was also hurting.  The pain was similar to prior episodes of low sodium.  His appetite has been gone.  He is nauseated, but this just started.  He hasn't been able to eat and drink the last few days.  He has been drinking "not that much" this week.  He feels like he is "jumping a lot", myoclonic jerks (also present with hyponatremia in August).  He was last admitted from 8/30-9/2 for beer potomania with Na++ 116, treated with IVF and diuretics held.  He was given IV fluids for sodium level of 112 and admitted. Neurosurgery felt no further evaluation or management was required for paresthesias.   Assessment & Plan: Principal Problem:   Hyponatremia with normal extracellular fluid volume Active Problems:   DM type 2 (diabetes mellitus, type 2) (HCC)   CKD (chronic kidney disease) stage 3, GFR 30-59 ml/min   Chronic atrial fibrillation   Alcohol dependence syndrome (HCC)   Tobacco abuse   Chest pain   HTN (hypertension)   HLD (hyperlipidemia)   Chronic combined systolic and diastolic CHF (congestive heart failure) (HCC)  Hyponatremia: Most likely acute on chronic and hypovolemic with decreased solute intake and continued diuretic use.  - Rate of correction now acceptable, Na 118 this AM. Will continue with low rate,  change back to isotonic saline and remeasure this PM, aiming for no more than 16mEq/L increase every 24 hours.  - Generally, will continue fluid restriction.   Alcohol abuse and withdrawal:  - Continue CIWA protocol. Requiring ativan and imodium for symptoms.  Myoclonic jerks: Chronic problem, worse with hyponatremia and currently improving.  - PT/OT.  Paresthesias: This may be exacerbated by severe hyponatremia. s/p ACDF C4-C6 Jul 24, 2020, paresthesias were present prior and may continue long-term. Motor function intact. - Dr. Reatha Armour has written a note and does not suggest further evaluation/treatment - Continue Neurontin. With improvement in renal function, will increase to TID gabapentin dosing. Augment pain medications as well.   Chronic combined HFrEF: LVEF 35-40% in April 2021.  - Holding torsemide with hypovolemic hyponatremia, likely to restart if taking adequate po on 9/17.  - Continue low dose spironolactone for now.   Chronic AFib with bradycardic ventricular response:  - Holding amiodarone and digoxin with bradycardia. Stable, asymptomatic. - Not on anticoagulation reportedly due to high fall risk, ongoing EtOH abuse   Chest pain: Mostly related to keloid on anterior chest. Unremarkable CXR, troponin chronically slightly elevated with downward trend and no ischemic ECG changes to suggest ACS.  - Continue bidil  HLD:  - Continue statin  EtOH dependence:  - CIWA protocol.  - At higher risk for seizures with severe electrolyte derangement.   Tobacco use:  - Cessation counseling provided.   Hepatic cirrhosis: Due to alcohol abuse and also has chronic hepatitis C.  - Restart diuretics when  able.   Stage IIIa CKD:  - Avoid nephrotoxins.  T2DM:  - SSI  DVT prophylaxis: Lovenox Code Status: Full Family Communication: None at bedside Disposition Plan:  Status is: Inpatient  Remains inpatient appropriate because:Inpatient level of care appropriate due to severity of  illness   Dispo: The patient is from: Home              Anticipated d/c is to: Home              Anticipated d/c date is: 2 days, requires slow correction of hyponatremia and continued management of alcohol withdrawal.              Patient currently is not medically stable to d/c.  Consultants:   None  Procedures:   None  Antimicrobials:  None   Subjective: Required several doses of ativan last night, feels anxious, tremulous with worsened jerking movements this morning, feels like he needs ativan. Pain is stable, severe, in hands and neck, not improved with hydrocodone.   Objective: Vitals:   09/05/20 2110 09/06/20 0157 09/06/20 0516 09/06/20 1217  BP: 127/83 121/61 134/85 119/77  Pulse: 62 (!) 53 (!) 54 (!) 50  Resp: 20 18 17 17   Temp: 97.6 F (36.4 C) (!) 97.5 F (36.4 C) 98.6 F (37 C) 98.1 F (36.7 C)  TempSrc: Oral Oral Oral Oral  SpO2: 99% 97% 98% 99%  Weight:      Height:        Intake/Output Summary (Last 24 hours) at 09/06/2020 1650 Last data filed at 09/06/2020 1359 Gross per 24 hour  Intake 1353 ml  Output 1650 ml  Net -297 ml   Filed Weights   09/04/20 0635  Weight: 77.6 kg   Gen: 59 y.o. male in no distress Pulm: Nonlabored breathing room air. Clear. CV: Regular rate and rhythm. No murmur, rub, or gallop. No JVD, no pitting dependent edema. GI: Abdomen soft, non-tender, non-distended, with normoactive bowel sounds.  Ext: Warm, no deformities Skin: No new rashes, lesions or ulcers on visualized skin. Neuro: Alert and oriented. No new focal neurological deficits. Paresthesias without diminished strength distal extremities. Psych: Judgement and insight appear fair. Mood anxious & affect congruent. Behavior is appropriate.    Data Reviewed: I have personally reviewed following labs and imaging studies  CBC: Recent Labs  Lab 09/04/20 0649 09/05/20 0640  WBC 4.2 3.9*  NEUTROABS 2.8  --   HGB 11.2* 11.1*  HCT 32.7* 32.6*  MCV 86.1 86.5  PLT  284 242   Basic Metabolic Panel: Recent Labs  Lab 09/05/20 0157 09/05/20 0640 09/05/20 1441 09/05/20 2246 09/06/20 0513  NA 121* 123* 123* 122* 118*  K 4.0 4.3 4.8 4.4 4.5  CL 88* 91* 89* 87* 87*  CO2 26 26 26 27 23   GLUCOSE 170* 162* 166* 107* 122*  BUN 24* 25* 24* 24* 22*  CREATININE 1.29* 1.37* 1.37* 1.41* 1.27*  CALCIUM 8.5* 8.7* 8.6* 8.6* 8.3*   GFR: Estimated Creatinine Clearance: 65.9 mL/min (A) (by C-G formula based on SCr of 1.27 mg/dL (H)). Liver Function Tests: Recent Labs  Lab 09/04/20 0649  AST 22  ALT 16  ALKPHOS 50  BILITOT 0.5  PROT 6.7  ALBUMIN 3.0*   No results for input(s): LIPASE, AMYLASE in the last 168 hours. No results for input(s): AMMONIA in the last 168 hours. Coagulation Profile: No results for input(s): INR, PROTIME in the last 168 hours. Cardiac Enzymes: No results for input(s): CKTOTAL, CKMB, CKMBINDEX, TROPONINI  in the last 168 hours. BNP (last 3 results) No results for input(s): PROBNP in the last 8760 hours. HbA1C: No results for input(s): HGBA1C in the last 72 hours. CBG: Recent Labs  Lab 09/05/20 1709 09/05/20 1721 09/05/20 2113 09/06/20 0739 09/06/20 1126  GLUCAP 341* 255* 98 149* 151*   Lipid Profile: No results for input(s): CHOL, HDL, LDLCALC, TRIG, CHOLHDL, LDLDIRECT in the last 72 hours. Thyroid Function Tests: No results for input(s): TSH, T4TOTAL, FREET4, T3FREE, THYROIDAB in the last 72 hours. Anemia Panel: No results for input(s): VITAMINB12, FOLATE, FERRITIN, TIBC, IRON, RETICCTPCT in the last 72 hours. Urine analysis:    Component Value Date/Time   COLORURINE YELLOW 08/16/2020 1155   APPEARANCEUR CLEAR 08/16/2020 1155   LABSPEC 1.005 08/16/2020 1155   PHURINE 5.0 08/16/2020 1155   GLUCOSEU NEGATIVE 08/16/2020 1155   HGBUR SMALL (A) 08/16/2020 1155   BILIRUBINUR NEGATIVE 08/16/2020 1155   BILIRUBINUR neg 05/08/2017 1503   KETONESUR NEGATIVE 08/16/2020 1155   PROTEINUR 100 (A) 08/16/2020 1155    UROBILINOGEN 0.2 05/08/2017 1503   UROBILINOGEN 1.0 04/26/2012 1328   NITRITE NEGATIVE 08/16/2020 1155   LEUKOCYTESUR NEGATIVE 08/16/2020 1155   Recent Results (from the past 240 hour(s))  SARS Coronavirus 2 by RT PCR (hospital order, performed in [facility] hospital lab) Nasopharyngeal Nasopharyngeal Swab     Status: None   Collection Time: 09/04/20  9:07 AM   Specimen: Nasopharyngeal Swab  Result Value Ref Range Status   SARS Coronavirus 2 NEGATIVE NEGATIVE Final    Comment: (NOTE) SARS-CoV-2 target nucleic acids are NOT DETECTED.  The SARS-CoV-2 RNA is generally detectable in upper and lower respiratory specimens during the acute phase of infection. The lowest concentration of SARS-CoV-2 viral copies this assay can detect is 250 copies / mL. A negative result does not preclude SARS-CoV-2 infection and should not be used as the sole basis for treatment or other patient management decisions.  A negative result may occur with improper specimen collection / handling, submission of specimen other than nasopharyngeal swab, presence of viral mutation(s) within the areas targeted by this assay, and inadequate number of viral copies (<250 copies / mL). A negative result must be combined with clinical observations, patient history, and epidemiological information.  Fact Sheet for Patients:   StrictlyIdeas.no  Fact Sheet for Healthcare Providers: BankingDealers.co.za  This test is not yet approved or  cleared by the [place] FDA and has been authorized for detection and/or diagnosis of SARS-CoV-2 by FDA under an Emergency Use Authorization (EUA).  This EUA will remain in effect (meaning this test can be used) for the duration of the COVID-19 declaration under Section 564(b)(1) of the Act, 21 U.S.C. section 360bbb-3(b)(1), unless the authorization is terminated or revoked sooner.  Performed at [facility], [place]  [address], [place], [place] [address]       Radiology Studies: No results found.  Scheduled Meds: . aspirin  81 mg Oral Daily  . atorvastatin  20 mg Oral q1800  . enoxaparin (LOVENOX) injection  40 mg Subcutaneous Q24H  . folic acid  1 mg Oral Daily  . gabapentin  600 mg Oral TID  . insulin aspart  0-15 Units Subcutaneous TID WC  . insulin aspart  0-5 Units Subcutaneous QHS  . isosorbide-hydrALAZINE  1 tablet Oral TID  . multivitamin with minerals  1 tablet Oral Daily  . nicotine  14 mg Transdermal Daily  . sodium chloride flush  3 mL Intravenous Q12H  . spironolactone  12.5 mg Oral Daily  . thiamine  100 mg Oral Daily   Or  . thiamine  100 mg Intravenous Daily   Continuous Infusions: . sodium chloride 75 mL/hr at 09/06/20 0846     LOS: 2 days   Time spent: 35 minutes.  Patrecia Pour, MD [facility] [email or web address] 09/06/2020, 4:50 PM

## 2020-09-06 NOTE — Evaluation (Signed)
Occupational Therapy Evaluation Patient Details Name: Brad [name] MRN: [id number] DOB: 06/03/1961 Today's Date: 09/06/2020    History of Present Illness Brad [name] is a 59 y.o. male with medical history significant of ETOH abuse, Afib, CHF, HTN with recurrent acute on chronic hyponatremia presenting with the same (12 admissions and 5 ER visits in the last 6 months for various reasons).   Clinical Impression   Brad [name] presents with decreased balance, decreased activity tolerance, myoclonic jerking of extremities and complaints of pain limiting independence and safety with ambulation and ADLs. Patient min guard and supervision for all tasks at this time for safety. Patient will benefit from skilled OT services while in hospital in order to advance to independence in order to return home at discharge.    Follow Up Recommendations  No OT follow up    Equipment Recommendations  None recommended by OT    Recommendations for Other Services       Precautions / Restrictions Precautions Precautions: Fall Precaution Comments: myoclonic jerking Restrictions Weight Bearing Restrictions: No Other Position/Activity Restrictions: Reports recent ACDF - limited cervical rotation.      Mobility Bed Mobility Overal bed mobility: Modified Independent                Transfers Overall transfer level: Needs assistance               General transfer comment: Min guard to ambulate with RW in room. No loss of balance. Mild myoclonic jerking of lower extremities.    Balance Overall balance assessment: Needs assistance Sitting-balance support: No upper extremity supported;Feet supported Sitting balance-Leahy Scale: Good       Standing balance-Leahy Scale: Fair Standing balance comment: Used walker for safety. Able to remove hands from walker.                           ADL either performed or assessed with clinical judgement   ADL Overall ADL's :  Needs assistance/impaired Eating/Feeding: Independent   Grooming: Standing;Wash/dry hands;Wash/dry face   Upper Body Bathing: Supervision/ safety   Lower Body Bathing: Min guard Lower Body Bathing Details (indicate cue type and reason): Able to donn socks at side of bed. Upper Body Dressing : Supervision/safety   Lower Body Dressing: Min guard   Toilet Transfer: Min guard;Ambulation;RW;Regular Museum/gallery exhibitions officer and Hygiene: Min guard       Functional mobility during ADLs: Min guard       Vision Patient Visual Report: No change from baseline       Perception     Praxis      Pertinent Vitals/Pain Pain Assessment: 0-10 Pain Score: 10-Worst pain ever Pain Location: Posterior Neck Pain Descriptors / Indicators: Aching;Discomfort Pain Intervention(s): Limited activity within patient's tolerance     Hand Dominance Right   Extremity/Trunk Assessment Upper Extremity Assessment Upper Extremity Assessment: Overall WFL for tasks assessed;LUE deficits/detail RUE Deficits / Details: Recent right thumb fracture, right grip weaker than left, otherwise functional ROM and strength. Reports improving numbness/tingling in hand LUE Deficits / Details: Functional ROM and strength.Reports improving numbness/tingling in hand   Lower Extremity Assessment Lower Extremity Assessment: Defer to PT evaluation   Cervical / Trunk Assessment Cervical / Trunk Assessment: Normal   Communication Communication Communication: No difficulties   Cognition Arousal/Alertness: Awake/alert Behavior During Therapy: WFL for tasks assessed/performed Overall Cognitive Status: Within Functional Limits for tasks assessed  General Comments       Exercises     Shoulder Instructions      Home Living Family/patient expects to be discharged to:: Private residence Living Arrangements: Other (Comment) (room mate) Available Help  at Discharge: Friend(s);Available PRN/intermittently Type of Home: Apartment Home Access: Level entry     Home Layout: One level     Bathroom Shower/Tub: Teacher, early years/pre: Standard Bathroom Accessibility: Yes How Accessible: Accessible via walker Home Equipment: [place] - 2 wheels;Cane - single point   Additional Comments: Reports using a cane in the store. Typically just sits outside.  Reports performing cooking.      Prior Functioning/Environment Level of Independence: Independent with assistive device(s)        Comments: uses cane PRN        OT Problem List: Decreased activity tolerance;Impaired balance (sitting and/or standing)      OT Treatment/Interventions: Self-care/ADL training;Balance training;Patient/family education;Therapeutic activities    OT Goals(Current goals can be found in the care plan section) Acute Rehab OT Goals Patient Stated Goal: to go home OT Goal Formulation: With patient Time For Goal Achievement: 09/20/20 Potential to Achieve Goals: Good  OT Frequency: Min 2X/week   Barriers to D/C:            Co-evaluation              AM-PAC OT "6 Clicks" Daily Activity     Outcome Measure Help from another person eating meals?: None Help from another person taking care of personal grooming?: A Little Help from another person toileting, which includes using toliet, bedpan, or urinal?: A Little Help from another person bathing (including washing, rinsing, drying)?: A Little Help from another person to put on and taking off regular upper body clothing?: A Little Help from another person to put on and taking off regular lower body clothing?: A Little 6 Click Score: 19   End of Session Equipment Utilized During Treatment: Rolling walker;Gait belt Nurse Communication: Mobility status  Activity Tolerance: Patient tolerated treatment well Patient left: in bed;with call bell/phone within reach;with bed alarm set  OT Visit  Diagnosis: Unsteadiness on feet (R26.81);Pain Pain - part of body:  (neck)                Time: 0923-3007 OT Time Calculation (min): 29 min Charges:  OT General Charges $OT Visit: 1 Visit OT Evaluation $OT Eval Low Complexity: 1 Low OT Treatments $Self Care/Home Management : 8-22 mins  Khalon Cansler, OTR/L [place] [phone] Pager: [phone]   Brad [name] 09/06/2020, 11:45 AM

## 2020-09-06 NOTE — TOC Progression Note (Signed)
Transition of Care [facility]) - Progression Note    Patient Details  Name: Brad [name] MRN: [id number] Date of Birth: Feb 03, 1961  Transition of [facility] CM/SW Contact  Purcell Mouton, RN Phone Number: 09/06/2020, 10:46 AM  Clinical Narrative:    Pt from home and plan to return home. At present time there are no HH needs.    Expected Discharge Plan: Home/Self Care Barriers to Discharge: No Barriers Identified  Expected Discharge Plan and Services Expected Discharge Plan: Home/Self Care       Living arrangements for the past 2 months: Single Family Home Expected Discharge Date:  (unknown)                                     Social Determinants of Health (SDOH) Interventions    Readmission Risk Interventions Readmission Risk Prevention Plan 08/17/2020 07/23/2020 06/13/2020  Transportation Screening Complete Complete Complete  PCP or Specialist Appt within 5-7 Days - - -  Home Care Screening - - -  Medication Review (RN CM) - - -  Medication Review ([place]) - Referral to Pharmacy Complete  PCP or Specialist appointment within 3-5 days of discharge Complete Complete Complete  PCP/Specialist Appt Not Complete comments - - -  [place] or Home Care Consult Complete Complete Complete  HRI or Home Care Consult Pt Refusal Comments - - -  SW Recovery Care/Counseling Consult Complete Complete Complete  Palliative Care Screening Not Applicable Not Applicable Not Applicable  Skilled Nursing Facility Not Applicable Complete Complete  Some recent data might be hidden

## 2020-09-06 NOTE — Progress Notes (Addendum)
PT Cancellation Note  Patient Details Name: Brad [name] MRN: [id number] DOB: [birth year]   Cancelled Treatment:    Reason Eval/Treat Not Completed: Other (comment) ("jumpy right now" "I don't want to fall") Pt requests PT to check back later.  1530: Pt declined, wanted to sleep. Again requests therapist check back tomorrow.   Shelda Truby,KATHrine E 09/06/2020, 11:39 AM Arlyce Dice, DPT Acute Rehabilitation Services Pager: [phone] Office: [phone]

## 2020-09-07 ENCOUNTER — Telehealth: Payer: Self-pay | Admitting: Family Medicine

## 2020-09-07 LAB — BASIC METABOLIC PANEL
Anion gap: 8 (ref 5–15)
Anion gap: 11 (ref 5–15)
BUN: 23 mg/dL — ABNORMAL HIGH (ref 6–20)
BUN: 23 mg/dL — ABNORMAL HIGH (ref 6–20)
CO2: 23 mmol/L (ref 22–32)
CO2: 23 mmol/L (ref 22–32)
Calcium: 8.5 mg/dL — ABNORMAL LOW (ref 8.9–10.3)
Calcium: 8.7 mg/dL — ABNORMAL LOW (ref 8.9–10.3)
Chloride: 86 mmol/L — ABNORMAL LOW (ref 98–111)
Chloride: 87 mmol/L — ABNORMAL LOW (ref 98–111)
Creatinine, Ser: 1.46 mg/dL — ABNORMAL HIGH (ref 0.61–1.24)
Creatinine, Ser: 1.38 mg/dL — ABNORMAL HIGH (ref 0.61–1.24)
GFR calc Af Amer: 60 mL/min — ABNORMAL LOW (ref >60)
GFR calc Af Amer: >60 mL/min (ref >60)
GFR calc non Af Amer: 52 mL/min — ABNORMAL LOW (ref >60)
GFR calc non Af Amer: 55 mL/min — ABNORMAL LOW (ref >60)
Glucose, Bld: 138 mg/dL — ABNORMAL HIGH (ref 70–99)
Glucose, Bld: 164 mg/dL — ABNORMAL HIGH (ref 70–99)
Potassium: 5.2 mmol/L — ABNORMAL HIGH (ref 3.5–5.1)
Potassium: 5.2 mmol/L — ABNORMAL HIGH (ref 3.5–5.1)
Sodium: 117 mmol/L — CL (ref 135–145)
Sodium: 121 mmol/L — ABNORMAL LOW (ref 135–145)

## 2020-09-07 LAB — GLUCOSE, CAPILLARY
Glucose-Capillary: 134 mg/dL — ABNORMAL HIGH (ref 70–99)
Glucose-Capillary: 158 mg/dL — ABNORMAL HIGH (ref 70–99)
Glucose-Capillary: 168 mg/dL — ABNORMAL HIGH (ref 70–99)
Glucose-Capillary: 171 mg/dL — ABNORMAL HIGH (ref 70–99)

## 2020-09-07 LAB — SODIUM, URINE, RANDOM: Sodium, Ur: <10 mmol/L

## 2020-09-07 LAB — OSMOLALITY, URINE: Osmolality, Ur: 180 mOsm/kg — ABNORMAL LOW (ref 300–900)

## 2020-09-07 MED ORDER — SODIUM ZIRCONIUM CYCLOSILICATE 5 G PO PACK
5.0000 g | PACK | Freq: Every day | ORAL | Status: DC
  Administered 2020-09-07 – 2020-09-09 (×3): 5 g via ORAL
  Filled 2020-09-07 (×3): qty 1

## 2020-09-07 NOTE — Telephone Encounter (Signed)
Patient is calling for Brad [name] Patient would like Brad [name] to call him in the hospital - Please advise [phone]
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
## 2020-09-07 NOTE — Progress Notes (Signed)
PROGRESS NOTE  Brad [name]  PPI:[id number] DOB: 1961-09-26 DOA: 09/04/2020 PCP: Charlott Rakes, MD   Brief Narrative: Brad [name] is a 59 y.o. male with medical history significant of ETOH abuse, Afib, CHF, HTN withrecurrent acute onchronic hyponatremia presenting with the same (12 admissions and 5 ER visits in the last 6 months for various reasons). He was last admitted from6/8-15 for acute on chronic systolic CHF with hypervolemic hyponatremia associated with CHF and chronic beer potomania.  He reports that he has had numbness in his hands and feet and was having difficulty walking.  He had surgery about 6 weeks ago and his posterior neck was hurting.  His chest was also hurting.  The pain was similar to prior episodes of low sodium.  His appetite has been gone.  He is nauseated, but this just started.  He hasn't been able to eat and drink the last few days.  He has been drinking "not that much" this week.  He feels like he is "jumping a lot", myoclonic jerks (also present with hyponatremia in August).  He was last admitted from 8/30-9/2 for beer potomania with Na++ 116, treated with IVF and diuretics held.  He was given IV fluids for sodium level of 112 and admitted. Neurosurgery felt no further evaluation or management was required for paresthesias. Nephrology has been consulted to assist with hyponatremia management.  Assessment & Plan: Principal Problem:   Hyponatremia with normal extracellular fluid volume Active Problems:   DM type 2 (diabetes mellitus, type 2) (HCC)   CKD (chronic kidney disease) stage 3, GFR 30-59 ml/min   Chronic atrial fibrillation   Alcohol dependence syndrome (HCC)   Tobacco abuse   Chest pain   HTN (hypertension)   HLD (hyperlipidemia)   Chronic combined systolic and diastolic CHF (congestive heart failure) (HCC)  Hyponatremia: Most likely acute on chronic and hypovolemic with decreased solute intake and continued diuretic use.  - Nephrology  consulted for assistance in multifactorial hyponatremia. Urine studies pending, d/w RN. Lowering IVF and will decide on need for diuretic based on urine studies.  - Generally, will continue fluid restriction.   Alcohol abuse and withdrawal:  - Improved. Pt states he is going to throw out all alcohol when he gets home.  Myoclonic jerks: Chronic problem, worse with hyponatremia and currently improving.  - PT/OT.  Paresthesias: This may be exacerbated by severe hyponatremia. s/p ACDF C4-C6 Jul 24, 2020, paresthesias were present prior and may continue long-term. Motor function intact. - Dr. Reatha Armour has written a note and does not suggest further evaluation/treatment - Continue Neurontin, increased to TID gabapentin dosing. Also continuing oxycodone as he  has been prescribed by Dr. Reatha Armour as recently per PDMP review.  Chronic combined HFrEF: LVEF 35-40% in April 2021.  - Holding torsemide with hypovolemic hyponatremia, likely to restart soon. - Hold spironolactone with hyperkalemia.   Hyperkalemia: Mild.  - Hold spironolactone - Lokelma - Cardiac monitoring - Recheck in AM  Chronic AFib with bradycardic ventricular response:  - Holding amiodarone and digoxin with bradycardia. Stable, asymptomatic. - Not on anticoagulation reportedly due to high fall risk, ongoing EtOH abuse   Chest pain: Mostly related to keloid on anterior chest. Unremarkable CXR, troponin chronically slightly elevated with downward trend and no ischemic ECG changes to suggest ACS.  - Continue bidil  HLD:  - Continue statin  EtOH dependence: At higher risk for seizures with severe electrolyte derangement.  - CIWA protocol.  - Pt resolute in quitting.  Tobacco use:  -  Cessation counseling provided.   Hepatic cirrhosis: Due to alcohol abuse and also has chronic hepatitis C.  - Restart diuretics when able.   Stage IIIa CKD:  - Avoid nephrotoxins.  T2DM:  - SSI  DVT prophylaxis: Lovenox Code Status:  Full Family Communication: None at bedside Disposition Plan:  Status is: Inpatient  Remains inpatient appropriate because:Inpatient level of care appropriate due to severity of illness   Dispo: The patient is from: Home              Anticipated d/c is to: Home              Anticipated d/c date is: 2 days, requires slow correction of hyponatremia.              Patient currently is not medically stable to d/c.  Consultants:   None  Procedures:   None  Antimicrobials:  None   Subjective: Withdrawal symptoms improving, though sodium level down from yesterday afternoon. Feeling somewhat short of breath overnight but not today. Doesn't think he's been urinating enough. No chest pain or leg swelling.   Objective: Vitals:   09/06/20 2103 09/07/20 0511 09/07/20 0615 09/07/20 1315  BP: 128/82 (!) 137/99  130/73  Pulse: (!) 52 (!) 56  (!) 53  Resp: 16 14  16   Temp: 98 F (36.7 C) 97.9 F (36.6 C)  98.3 F (36.8 C)  TempSrc: Oral Oral  Oral  SpO2: 99% 97% 99% 99%  Weight:      Height:        Intake/Output Summary (Last 24 hours) at 09/07/2020 1410 Last data filed at 09/07/2020 0512 Gross per 24 hour  Intake 1866.35 ml  Output 900 ml  Net 966.35 ml   Filed Weights   09/04/20 0635  Weight: 77.6 kg   Gen: 59 y.o. male in no distress Pulm: Nonlabored breathing room air. No wheezes or crackles. CV: Regular rate and rhythm. No murmur, rub, or gallop. No JVD, no dependent edema. GI: Abdomen soft, non-tender, non-distended, with normoactive bowel sounds.  Ext: Warm, no deformities Skin: No rashes, lesions or ulcers on visualized skin. Neuro: Alert and oriented. Paresthetic sensory changes distal extremities is stable, Some associated decreased dexterity, but no new focal neurological deficits. Psych: Judgement and insight appear fair. Mood euthymic & affect congruent. Behavior is appropriate.    Data Reviewed: I have personally reviewed following labs and imaging  studies  CBC: Recent Labs  Lab 09/04/20 0649 09/05/20 0640  WBC 4.2 3.9*  NEUTROABS 2.8  --   HGB 11.2* 11.1*  HCT 32.7* 32.6*  MCV 86.1 86.5  PLT 284 536   Basic Metabolic Panel: Recent Labs  Lab 09/05/20 1441 09/05/20 2246 09/06/20 0513 09/06/20 1612 09/07/20 0444  NA 123* 122* 118* 120* 117*  K 4.8 4.4 4.5 4.9 5.2*  CL 89* 87* 87* 87* 86*  CO2 26 27 23 25 23   GLUCOSE 166* 107* 122* 148* 138*  BUN 24* 24* 22* 21* 23*  CREATININE 1.37* 1.41* 1.27* 1.35* 1.46*  CALCIUM 8.6* 8.6* 8.3* 8.8* 8.5*   GFR: Estimated Creatinine Clearance: 57.3 mL/min (A) (by C-G formula based on SCr of 1.46 mg/dL (H)). Liver Function Tests: Recent Labs  Lab 09/04/20 0649  AST 22  ALT 16  ALKPHOS 50  BILITOT 0.5  PROT 6.7  ALBUMIN 3.0*   No results for input(s): LIPASE, AMYLASE in the last 168 hours. No results for input(s): AMMONIA in the last 168 hours. Coagulation Profile: No results for  input(s): INR, PROTIME in the last 168 hours. Cardiac Enzymes: No results for input(s): CKTOTAL, CKMB, CKMBINDEX, TROPONINI in the last 168 hours. BNP (last 3 results) No results for input(s): PROBNP in the last 8760 hours. HbA1C: No results for input(s): HGBA1C in the last 72 hours. CBG: Recent Labs  Lab 09/06/20 1126 09/06/20 1702 09/06/20 2058 09/07/20 0745 09/07/20 1140  GLUCAP 151* 178* 143* 134* 158*   Lipid Profile: No results for input(s): CHOL, HDL, LDLCALC, TRIG, CHOLHDL, LDLDIRECT in the last 72 hours. Thyroid Function Tests: No results for input(s): TSH, T4TOTAL, FREET4, T3FREE, THYROIDAB in the last 72 hours. Anemia Panel: No results for input(s): VITAMINB12, FOLATE, FERRITIN, TIBC, IRON, RETICCTPCT in the last 72 hours. Urine analysis:    Component Value Date/Time   COLORURINE YELLOW 08/16/2020 1155   APPEARANCEUR CLEAR 08/16/2020 1155   LABSPEC 1.005 08/16/2020 1155   PHURINE 5.0 08/16/2020 1155   GLUCOSEU NEGATIVE 08/16/2020 1155   HGBUR SMALL (A) 08/16/2020  1155   BILIRUBINUR NEGATIVE 08/16/2020 1155   BILIRUBINUR neg 05/08/2017 1503   KETONESUR NEGATIVE 08/16/2020 1155   PROTEINUR 100 (A) 08/16/2020 1155   UROBILINOGEN 0.2 05/08/2017 1503   UROBILINOGEN 1.0 04/26/2012 1328   NITRITE NEGATIVE 08/16/2020 1155   LEUKOCYTESUR NEGATIVE 08/16/2020 1155   Recent Results (from the past 240 hour(s))  SARS Coronavirus 2 by RT PCR (hospital order, performed in [place] hospital lab) Nasopharyngeal Nasopharyngeal Swab     Status: None   Collection Time: 09/04/20  9:07 AM   Specimen: Nasopharyngeal Swab  Result Value Ref Range Status   SARS Coronavirus 2 NEGATIVE NEGATIVE Final    Comment: (NOTE) SARS-CoV-2 target nucleic acids are NOT DETECTED.  The SARS-CoV-2 RNA is generally detectable in upper and lower respiratory specimens during the acute phase of infection. The lowest concentration of SARS-CoV-2 viral copies this assay can detect is 250 copies / mL. A negative result does not preclude SARS-CoV-2 infection and should not be used as the sole basis for treatment or other patient management decisions.  A negative result may occur with improper specimen collection / handling, submission of specimen other than nasopharyngeal swab, presence of viral mutation(s) within the areas targeted by this assay, and inadequate number of viral copies (<250 copies / mL). A negative result must be combined with clinical observations, patient history, and epidemiological information.  Fact Sheet for Patients:   StrictlyIdeas.no  Fact Sheet for Healthcare Providers: BankingDealers.co.za  This test is not yet approved or  cleared by the [place] FDA and has been authorized for detection and/or diagnosis of SARS-CoV-2 by FDA under an Emergency Use Authorization (EUA).  This EUA will remain in effect (meaning this test can be used) for the duration of the COVID-19 declaration under Section 564(b)(1) of  the Act, 21 U.S.C. section 360bbb-3(b)(1), unless the authorization is terminated or revoked sooner.  Performed at [facility], [place] [address] [place], [place] [address]       Radiology Studies: No results found.  Scheduled Meds: . aspirin  81 mg Oral Daily  . atorvastatin  20 mg Oral q1800  . enoxaparin (LOVENOX) injection  40 mg Subcutaneous Q24H  . folic acid  1 mg Oral Daily  . gabapentin  600 mg Oral TID  . insulin aspart  0-15 Units Subcutaneous TID WC  . insulin aspart  0-5 Units Subcutaneous QHS  . isosorbide-hydrALAZINE  1 tablet Oral TID  . multivitamin with minerals  1 tablet Oral Daily  . nicotine  14 mg Transdermal Daily  . sodium chloride flush  3 mL Intravenous Q12H  . sodium zirconium cyclosilicate  5 g Oral Daily  . thiamine  100 mg Oral Daily   Or  . thiamine  100 mg Intravenous Daily   Continuous Infusions: . sodium chloride 75 mL/hr at 09/07/20 1039     LOS: 3 days   Time spent: 25 minutes.  Patrecia Pour, MD [facility] [email or web address] 09/07/2020, 2:10 PM

## 2020-09-08 LAB — GLUCOSE, CAPILLARY
Glucose-Capillary: 138 mg/dL — ABNORMAL HIGH (ref 70–99)
Glucose-Capillary: 128 mg/dL — ABNORMAL HIGH (ref 70–99)
Glucose-Capillary: 140 mg/dL — ABNORMAL HIGH (ref 70–99)
Glucose-Capillary: 147 mg/dL — ABNORMAL HIGH (ref 70–99)

## 2020-09-08 LAB — BASIC METABOLIC PANEL
Anion gap: 11 (ref 5–15)
BUN: 25 mg/dL — ABNORMAL HIGH (ref 6–20)
CO2: 21 mmol/L — ABNORMAL LOW (ref 22–32)
Calcium: 8.6 mg/dL — ABNORMAL LOW (ref 8.9–10.3)
Chloride: 89 mmol/L — ABNORMAL LOW (ref 98–111)
Creatinine, Ser: 1.39 mg/dL — ABNORMAL HIGH (ref 0.61–1.24)
GFR calc Af Amer: >60 mL/min (ref >60)
GFR calc non Af Amer: 55 mL/min — ABNORMAL LOW (ref >60)
Glucose, Bld: 149 mg/dL — ABNORMAL HIGH (ref 70–99)
Potassium: 5.4 mmol/L — ABNORMAL HIGH (ref 3.5–5.1)
Sodium: 121 mmol/L — ABNORMAL LOW (ref 135–145)

## 2020-09-08 LAB — RENAL FUNCTION PANEL
Albumin: 3.1 g/dL — ABNORMAL LOW (ref 3.5–5.0)
Anion gap: 7 (ref 5–15)
BUN: 26 mg/dL — ABNORMAL HIGH (ref 6–20)
CO2: 23 mmol/L (ref 22–32)
Calcium: 8.2 mg/dL — ABNORMAL LOW (ref 8.9–10.3)
Chloride: 90 mmol/L — ABNORMAL LOW (ref 98–111)
Creatinine, Ser: 1.39 mg/dL — ABNORMAL HIGH (ref 0.61–1.24)
GFR calc Af Amer: >60 mL/min (ref >60)
GFR calc non Af Amer: 55 mL/min — ABNORMAL LOW (ref >60)
Glucose, Bld: 147 mg/dL — ABNORMAL HIGH (ref 70–99)
Phosphorus: 4.3 mg/dL (ref 2.5–4.6)
Potassium: 4.7 mmol/L (ref 3.5–5.1)
Sodium: 120 mmol/L — ABNORMAL LOW (ref 135–145)

## 2020-09-08 MED ORDER — FUROSEMIDE 40 MG PO TABS
40.0000 mg | ORAL_TABLET | Freq: Two times a day (BID) | ORAL | Status: DC
  Administered 2020-09-08 – 2020-09-10 (×4): 40 mg via ORAL
  Filled 2020-09-08 (×4): qty 1

## 2020-09-08 MED ORDER — FUROSEMIDE 10 MG/ML IJ SOLN
60.0000 mg | Freq: Once | INTRAMUSCULAR | Status: AC
  Administered 2020-09-08: 60 mg via INTRAVENOUS
  Filled 2020-09-08: qty 6
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
## 2020-09-08 NOTE — Progress Notes (Signed)
Nephrology Follow-Up Consult note   Assessment/Recommendations: Brad [name] is a/an 59 y.o. male with a past medical history significant for alcohol use disorder, atrial fibrillation, CHF, HTN, CKD who present w/ hyponatremia   Hyponatremia: Likely multifactorial but beer potomania (or free water excess with minimal solute intake) contributing largely as well as CHF.  Has improved with IV fluids but now stalled.  Urine sodium less than 10.  Sodium deficit likely resolved now and CHF contributing.  Will start diuretics today -Stop normal saline -IV Lasix 60 mg this morning and continue oral Lasix 40 mg twice daily -Continue to monitor sodium twice daily  CHF: With reduced ejection fraction likely associated with alcohol.  Can continue BiDil at current dose.    Starting diuretics as above  Hypertension: Controlled on BiDil  Alcohol use disorder: Patient interested in quitting and says he is weaning his alcohol use.  Encouraged and counseled on cessation.  CKD: Baseline creatinine likely 1.5-1.8 off and lower due to free water excess.  Likely will rise some with diuresis and correction of sodium  DM2 uncontrolled with hyperglycemia: insulin mgmt per primary team  Hyperkalemia: Mild elevation up to 5.4.  On Lokelma 5g daily no adjustments needed at this time.   Recommendations conveyed to primary service.    [facility] 09/08/2020 11:51 AM  ___________________________________________________________  CC: Hyponatremia  Interval History/Subjective: Patient states he feels about the same as yesterday.  Patient does feel like he is starting to accumulate fluid but denies any other issues.  Denies nausea or vomiting.   Medications:  Current Facility-Administered Medications  Medication Dose Route Frequency Provider Last Rate Last Admin  . acetaminophen (TYLENOL) tablet 650 mg  650 mg Oral Q6H PRN Patrecia Pour, MD   650 mg at 09/06/20 8527    Or  . acetaminophen (TYLENOL) suppository 650 mg  650 mg Rectal Q6H PRN Patrecia Pour, MD      . aspirin chewable tablet 81 mg  81 mg Oral Daily Karmen Bongo, MD   81 mg at 09/08/20 1007  . atorvastatin (LIPITOR) tablet 20 mg  20 mg Oral q1800 Karmen Bongo, MD   20 mg at 09/07/20 1733  . enoxaparin (LOVENOX) injection 40 mg  40 mg Subcutaneous Q24H Karmen Bongo, MD   40 mg at 09/07/20 2138  . folic acid (FOLVITE) tablet 1 mg  1 mg Oral Daily Karmen Bongo, MD   1 mg at 09/08/20 1008  . furosemide (LASIX) tablet 40 mg  40 mg Oral BID Reesa Chew, MD      . gabapentin (NEURONTIN) capsule 600 mg  600 mg Oral TID Patrecia Pour, MD   600 mg at 09/08/20 1008  . insulin aspart (novoLOG) injection 0-15 Units  0-15 Units Subcutaneous TID WC Karmen Bongo, MD   2 Units at 09/08/20 1008  . insulin aspart (novoLOG) injection 0-5 Units  0-5 Units Subcutaneous QHS Karmen Bongo, MD      . isosorbide-hydrALAZINE (BIDIL) 20-37.5 MG per tablet 1 tablet  1 tablet Oral TID Karmen Bongo, MD   1 tablet at 09/08/20 1007  . multivitamin with minerals tablet 1 tablet  1 tablet Oral Daily Karmen Bongo, MD   1 tablet at 09/08/20 1008  . nicotine (NICODERM CQ - dosed in mg/24 hours) patch 14 mg  14 mg Transdermal Daily Karmen Bongo, MD   14 mg at 09/08/20 1009  . ondansetron (ZOFRAN) injection 4 mg  4 mg Intravenous Q6H PRN Lorin Mercy,  Anderson Malta, MD   4 mg at 09/07/20 1038  . oxyCODONE (Oxy IR/ROXICODONE) immediate release tablet 5-10 mg  5-10 mg Oral Q4H PRN Patrecia Pour, MD   10 mg at 09/08/20 0751  . sodium chloride flush (NS) 0.9 % injection 3 mL  3 mL Intravenous Q12H Karmen Bongo, MD   3 mL at 09/08/20 1009  . sodium zirconium cyclosilicate (LOKELMA) packet 5 g  5 g Oral Daily Patrecia Pour, MD   5 g at 09/08/20 1008  . thiamine tablet 100 mg  100 mg Oral Daily Karmen Bongo, MD   100 mg at 09/08/20 1008   Or  . thiamine (B-1) injection 100 mg  100 mg Intravenous Daily Karmen Bongo, MD    100 mg at 09/04/20 1335      Review of Systems: 10 systems reviewed and negative except per interval history/subjective  Physical Exam: Vitals:   09/07/20 2219 09/08/20 0625  BP: 131/82 111/82  Pulse: (!) 57 (!) 54  Resp: 16 20  Temp: (!) 97.5 F (36.4 C) (!) 97.5 F (36.4 C)  SpO2: 99% 100%   Total I/O In: -  Out: 500 [Urine:500]  Intake/Output Summary (Last 24 hours) at 09/08/2020 1151 Last data filed at 09/08/2020 1013 Gross per 24 hour  Intake 2278.71 ml  Output 1045 ml  Net 1233.71 ml   Constitutional: well-appearing, no acute distress ENMT: ears and nose without scars or lesions, MMM CV: normal rate, trace edema in the bilateral lower extremities Respiratory: clear to auscultation, normal work of breathing Gastrointestinal: soft, non-tender, no palpable masses or hernias Skin: no visible lesions or rashes Psych: alert, judgement/insight appropriate, appropriate mood and affect   Test Results I personally reviewed new and old clinical labs and radiology tests Lab Results  Component Value Date   NA 121 (L) 09/08/2020   K 5.4 (H) 09/08/2020   CL 89 (L) 09/08/2020   CO2 21 (L) 09/08/2020   BUN 25 (H) 09/08/2020   CREATININE 1.39 (H) 09/08/2020   CALCIUM 8.6 (L) 09/08/2020   ALBUMIN 3.0 (L) 09/04/2020   PHOS 3.5 08/20/2020

## 2020-09-08 NOTE — Progress Notes (Signed)
PROGRESS NOTE  Brad [name] SWH:[id number] DOB: 1961/09/08 DOA: 09/04/2020 PCP: Charlott Rakes, MD   LOS: 4 days   Brief narrative: As per HPI,  Brad [name] a 59 y.o.malewith medical history significant ofETOH abuse, Afib, CHF, HTN withrecurrent acute onchronic hyponatremia  with recurrent admissions to the hospital presented to hospital with paresthesia, numbness in his hands and feet and was having difficulty walking. He had surgery about 6 weeks ago for C4-C6 disease and underwent surgical fixation on 08/24/2020.  Patient presented with sodium of 112 on this admission in patient was admitted for further evaluation and treatment.  Neurosurgery felt no further evaluation or management was required for paresthesias. Nephrology was consulted for management of hyponatremia.  Assessment/Plan:  Principal Problem:   Hyponatremia with normal extracellular fluid volume Active Problems:   DM type 2 (diabetes mellitus, type 2) (HCC)   CKD (chronic kidney disease) stage 3, GFR 30-59 ml/min   Chronic atrial fibrillation   Alcohol dependence syndrome (HCC)   Tobacco abuse   Chest pain   HTN (hypertension)   HLD (hyperlipidemia)   Chronic combined systolic and diastolic CHF (congestive heart failure) (HCC)  Acute on chronic hyponatremia: Likely multifactorial.  Patient does have history of chronic hyponatremia.  urine osmolality done on 09/07/2020 was low at 180.  Urinary sodium less than 10.  Currently on normal saline at 75 mls per hour.  Alcohol abuse and withdrawal:  - Improved  Myoclonic jerks: Chronic issue.  Gets worse with hyponatremia as per the patient.    Paresthesias: Likely exacerbated by severe hyponatremia. s/p ACDF C4-C6 Jul 24, 2020, paresthesias were present prior and may continue long-term. Motor function intact.  Neurosurgery  Dr. Reatha Armour does not recommend further evaluation for this.  Gabapentin dose has been increased to 3 times daily.  Continue  oxycodone.  Chronic combined HFrEF: LVEF 35-40% in April 2021.  Diuretics on hold including torsemide and spironolactone.  Will closely monitor volume status.  Hyperkalemia: Mild.  Potassium of 5.4 today.  Hold spironolactone.  Chronic AFib with bradycardic ventricular response:  Amiodarone and digoxin on hold.  Patient is not on anticoagulation due to high risk for falls and ongoing alcohol abuse.    Chest pain: Likely atypical related to keloid.  Patient does have chronic elevated troponins.  EKG unremarkable.   Continue bidil  Hyperlipidemia - Continue statin  EtOH dependence: Thinking about quitting.  On CIWA protocol.  Tobacco use:  On nicotine patch.  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  Will need resumption of diuretics when stable.  Stage IIIa CKD:  Closely monitor BMP.  Type 2 diabetes mellitus. Continue sliding scale insulin, Accu-Cheks diabetic diet.  Patient is supposed to be on Farxiga at home.   DVT prophylaxis: enoxaparin (LOVENOX) injection 40 mg Start: 09/04/20 2200   Code Status: Full code  Family Communication: None  Status is: Inpatient  Remains inpatient appropriate because:IV treatments appropriate due to intensity of illness or inability to take PO, Inpatient level of care appropriate due to severity of illness and Hyponatremia,   Dispo: The patient is from: Home              Anticipated d/c is to: Home              Anticipated d/c date is: 2 days              Patient currently is not medically stable to d/c.   Consultants:  Nephrology  Neurosurgery  Procedures:  None  Antibiotics:  . None  Anti-infectives (From admission, onward)   None     Subjective: Today, patient was seen and examined at bedside.  Patient denies any nausea vomiting or abdominal pain.  Complains of nausea.  Objective: Vitals:   09/07/20 2219 09/08/20 0625  BP: 131/82 111/82  Pulse: (!) 57 (!) 54   Resp: 16 20  Temp: (!) 97.5 F (36.4 C) (!) 97.5 F (36.4 C)  SpO2: 99% 100%    Intake/Output Summary (Last 24 hours) at 09/08/2020 0856 Last data filed at 09/08/2020 0600 Gross per 24 hour  Intake 2278.71 ml  Output 545 ml  Net 1733.71 ml   Filed Weights   09/04/20 0635  Weight: 77.6 kg   Body mass index is 23.86 kg/m.   Physical Exam:  GENERAL: Patient is alert awake and oriented. Not in obvious distress. HENT: No scleral pallor or icterus. Pupils equally reactive to light. Oral mucosa is moist NECK: is supple, no gross swelling noted. CHEST: Diminished breath sounds bilaterally.   CVS: S1 and S2 heard, no murmur. Regular rate and rhythm.  ABDOMEN: Soft, non-tender, bowel sounds are present. EXTREMITIES: Trace edema in the bilateral lower extremities. CNS: Cranial nerves are intact. No focal motor deficits. SKIN: warm and dry without rashes.  Data Review: I have personally reviewed the following laboratory data and studies,  CBC: Recent Labs  Lab 09/04/20 0649 09/05/20 0640  WBC 4.2 3.9*  NEUTROABS 2.8  --   HGB 11.2* 11.1*  HCT 32.7* 32.6*  MCV 86.1 86.5  PLT 284 983   Basic Metabolic Panel: Recent Labs  Lab 09/06/20 0513 09/06/20 1612 09/07/20 0444 09/07/20 1741 09/08/20 0539  NA 118* 120* 117* 121* 121*  K 4.5 4.9 5.2* 5.2* 5.4*  CL 87* 87* 86* 87* 89*  CO2 23 25 23 23  21*  GLUCOSE 122* 148* 138* 164* 149*  BUN 22* 21* 23* 23* 25*  CREATININE 1.27* 1.35* 1.46* 1.38* 1.39*  CALCIUM 8.3* 8.8* 8.5* 8.7* 8.6*   Liver Function Tests: Recent Labs  Lab 09/04/20 0649  AST 22  ALT 16  ALKPHOS 50  BILITOT 0.5  PROT 6.7  ALBUMIN 3.0*   No results for input(s): LIPASE, AMYLASE in the last 168 hours. No results for input(s): AMMONIA in the last 168 hours. Cardiac Enzymes: No results for input(s): CKTOTAL, CKMB, CKMBINDEX, TROPONINI in the last 168 hours. BNP (last 3 results) Recent Labs    08/16/20 1122 08/20/20 0852 09/04/20 0649  BNP  1,319.4* 782.2* 494.9*    ProBNP (last 3 results) No results for input(s): PROBNP in the last 8760 hours.  CBG: Recent Labs  Lab 09/07/20 0745 09/07/20 1140 09/07/20 1702 09/07/20 2215 09/08/20 0741  GLUCAP 134* 158* 168* 171* 138*   Recent Results (from the past 240 hour(s))  SARS Coronavirus 2 by RT PCR (hospital order, performed in [facility] hospital lab) Nasopharyngeal Nasopharyngeal Swab     Status: None   Collection Time: 09/04/20  9:07 AM   Specimen: Nasopharyngeal Swab  Result Value Ref Range Status   SARS Coronavirus 2 NEGATIVE NEGATIVE Final    Comment: (NOTE) SARS-CoV-2 target nucleic acids are NOT DETECTED.  The SARS-CoV-2 RNA is generally detectable in upper and lower respiratory specimens during the acute phase of infection. The lowest concentration of SARS-CoV-2 viral copies this assay can detect is 250 copies / mL. A negative result does not preclude SARS-CoV-2 infection and should not be used as the sole basis for treatment  or other patient management decisions.  A negative result may occur with improper specimen collection / handling, submission of specimen other than nasopharyngeal swab, presence of viral mutation(s) within the areas targeted by this assay, and inadequate number of viral copies (<250 copies / mL). A negative result must be combined with clinical observations, patient history, and epidemiological information.  Fact Sheet for Patients:   StrictlyIdeas.no  Fact Sheet for Healthcare Providers: BankingDealers.co.za  This test is not yet approved or  cleared by the [place] FDA and has been authorized for detection and/or diagnosis of SARS-CoV-2 by FDA under an Emergency Use Authorization (EUA).  This EUA will remain in effect (meaning this test can be used) for the duration of the COVID-19 declaration under Section 564(b)(1) of the Act, 21 U.S.C. section 360bbb-3(b)(1), unless the  authorization is terminated or revoked sooner.  Performed at [facility], [place] [address] [place], [place] [address]      Studies: No results found.   Flora Lipps, MD  [facility] 09/08/2020

## 2020-09-09 LAB — RENAL FUNCTION PANEL
Albumin: 3 g/dL — ABNORMAL LOW (ref 3.5–5.0)
Albumin: 3 g/dL — ABNORMAL LOW (ref 3.5–5.0)
Anion gap: 7 (ref 5–15)
Anion gap: 8 (ref 5–15)
BUN: 28 mg/dL — ABNORMAL HIGH (ref 6–20)
BUN: 29 mg/dL — ABNORMAL HIGH (ref 6–20)
CO2: 23 mmol/L (ref 22–32)
CO2: 23 mmol/L (ref 22–32)
Calcium: 8.3 mg/dL — ABNORMAL LOW (ref 8.9–10.3)
Calcium: 8.3 mg/dL — ABNORMAL LOW (ref 8.9–10.3)
Chloride: 91 mmol/L — ABNORMAL LOW (ref 98–111)
Chloride: 93 mmol/L — ABNORMAL LOW (ref 98–111)
Creatinine, Ser: 1.32 mg/dL — ABNORMAL HIGH (ref 0.61–1.24)
Creatinine, Ser: 1.34 mg/dL — ABNORMAL HIGH (ref 0.61–1.24)
GFR calc Af Amer: >60 mL/min (ref >60)
GFR calc Af Amer: >60 mL/min (ref >60)
GFR calc non Af Amer: 57 mL/min — ABNORMAL LOW (ref >60)
GFR calc non Af Amer: 58 mL/min — ABNORMAL LOW (ref >60)
Glucose, Bld: 138 mg/dL — ABNORMAL HIGH (ref 70–99)
Glucose, Bld: 141 mg/dL — ABNORMAL HIGH (ref 70–99)
Phosphorus: 4.3 mg/dL (ref 2.5–4.6)
Phosphorus: 4.4 mg/dL (ref 2.5–4.6)
Potassium: 4.5 mmol/L (ref 3.5–5.1)
Potassium: 4.7 mmol/L (ref 3.5–5.1)
Sodium: 122 mmol/L — ABNORMAL LOW (ref 135–145)
Sodium: 123 mmol/L — ABNORMAL LOW (ref 135–145)

## 2020-09-09 LAB — GLUCOSE, CAPILLARY
Glucose-Capillary: 145 mg/dL — ABNORMAL HIGH (ref 70–99)
Glucose-Capillary: 148 mg/dL — ABNORMAL HIGH (ref 70–99)
Glucose-Capillary: 146 mg/dL — ABNORMAL HIGH (ref 70–99)
Glucose-Capillary: 193 mg/dL — ABNORMAL HIGH (ref 70–99)

## 2020-09-09 MED ORDER — POLYETHYLENE GLYCOL 3350 17 G PO PACK
17.0000 g | PACK | Freq: Once | ORAL | Status: AC
  Administered 2020-09-09: 17 g via ORAL
  Filled 2020-09-09: qty 1

## 2020-09-09 NOTE — Progress Notes (Addendum)
PROGRESS NOTE  Brad [name] MWN:[id number] DOB: 10/13/1961 DOA: 09/04/2020 PCP: Charlott Rakes, MD   LOS: 5 days   Brief narrative: As per HPI,  Brad [name] a 58 y.o.malewith medical history significant ofETOH abuse, Afib, CHF, HTN withrecurrent acute onchronic hyponatremia  with recurrent admissions to the hospital presented to hospital with paresthesia, numbness in his hands and feet and was having difficulty walking. He had surgery about 6 weeks ago for C4-C6 disease and underwent surgical fixation on 08/24/2020.  Patient presented with sodium of 112 on this admission in patient was admitted for further evaluation and treatment.  Neurosurgery felt no further evaluation or management was required for paresthesias. Nephrology was consulted for management of hyponatremia.  Assessment/Plan:  Principal Problem:   Hyponatremia with normal extracellular fluid volume Active Problems:   DM type 2 (diabetes mellitus, type 2) (HCC)   CKD (chronic kidney disease) stage 3, GFR 30-59 ml/min   Chronic atrial fibrillation   Alcohol dependence syndrome (HCC)   Tobacco abuse   Chest pain   HTN (hypertension)   HLD (hyperlipidemia)   Chronic combined systolic and diastolic CHF (congestive heart failure) (HCC)  Acute on chronic hyponatremia: Likely multifactorial.  Patient does have history of chronic hyponatremia.  urine osmolality done on 09/07/2020 was low at 180.  Urinary sodium less than 10.  Patient initially received normal saline and has been transitioned to Lasix at this time as per nephrology.  Spoke with Dr. Osborne Casco of nephrology today.  He wishes to continue Lasix for today and take BMP in a.m.  Sodium of 122 today.  Myoclonic jerks: Chronic issue.  Gets worse with hyponatremia as per the patient.    Paresthesias: Likely exacerbated by severe hyponatremia.  Improving..  S/p Anterior cervical discectomy and fusion of C4-C6 Jul 24, 2020, paresthesias were present prior and may  continue long-term. Motor function intact.  Neurosurgery  Dr. Reatha Armour does not recommend further evaluation for this.  Gabapentin dose has been increased to 3 times daily.  Continue oxycodone.  Chronic combined HFrEF: LVEF 35-40% in April 2021.  Patient has been resumed on diuretics as per nephrology.  Digoxin and amiodarone on hold.  Continue aspirin, Lipitor, BiDil and Lasix.  Hyperkalemia: Mild.  Potassium of 4.5 today.  Monitor BMP in a.m. initially received Lokelma.  Has been started on diuretics.  Chronic AFib with bradycardic ventricular response:  Amiodarone and digoxin on hold.  He does have chronic kidney disease as well.  Heart rate has been mostly below 60.  Might need to consider discontinuation of at least digoxin on discharge.  Patient is not on anticoagulation due to high risk for falls and ongoing alcohol abuse.    Chest pain: Likely atypical related to keloid.  Patient does have chronic elevated troponins.  EKG unremarkable.   Continue bidil.  Hyperlipidemia Continue statin  EtOH dependence and withdrawal: Thinking about quitting.    On CIWA protocol. improved  Tobacco use:  On nicotine patch.  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  Diuretics has been initiated.  Stage IIIa CKD:  Creatinine of 1.3.  Check BMP in a.m.  Type 2 diabetes mellitus. Continue sliding scale insulin, Accu-Cheks diabetic diet.  Patient is supposed to be on Farxiga at home.  Hold for now.  DVT prophylaxis: enoxaparin (LOVENOX) injection 40 mg Start: 09/04/20 2200   Code Status: Full code  Family Communication: None  Status is: Inpatient  Remains inpatient appropriate because:IV treatments appropriate due to intensity  of illness or inability to take PO, Inpatient level of care appropriate due to severity of illness and Hyponatremia,   Dispo: The patient is from: Home              Anticipated d/c is to: Home              Anticipated d/c date  is: 1-2 days.  Patient has high risk of recurrent admission to the hospital.  Is motivated to quit alcohol this time and states that he will be  compliant with his medications.              Patient currently is not medically stable to d/c.   Consultants:  Nephrology  Neurosurgery  Procedures:  None  Antibiotics:  . None  Anti-infectives (From admission, onward)   None     Subjective: Patient was seen and examined at bedside.  Patient denies any nausea vomiting or shortness of breath, complains of constipation.  Objective: Vitals:   09/09/20 0510 09/09/20 1346  BP: 129/79 (!) 123/54  Pulse: (!) 58 62  Resp: 18 18  Temp: 98.1 F (36.7 C) 97.6 F (36.4 C)  SpO2: 99% 98%    Intake/Output Summary (Last 24 hours) at 09/09/2020 1613 Last data filed at 09/09/2020 1300 Gross per 24 hour  Intake 480 ml  Output 2500 ml  Net -2020 ml   Filed Weights   09/04/20 0635  Weight: 77.6 kg   Body mass index is 23.86 kg/m.   Physical Exam:  GENERAL: Patient is alert awake and oriented. Not in obvious distress. HENT: No scleral pallor or icterus. Pupils equally reactive to light. Oral mucosa is moist NECK: is supple, no gross swelling noted. CHEST: Diminished breath sounds bilaterally.   CVS: S1 and S2 heard, no murmur. Regular rate and rhythm.  ABDOMEN: Soft, non-tender, bowel sounds are present. EXTREMITIES: No cyanosis clubbing, trace edema of the lower extremities CNS: Cranial nerves are intact. No focal motor deficits. SKIN: warm and dry without rashes.  Data Review: I have personally reviewed the following laboratory data and studies,  CBC: Recent Labs  Lab 09/04/20 0649 09/05/20 0640  WBC 4.2 3.9*  NEUTROABS 2.8  --   HGB 11.2* 11.1*  HCT 32.7* 32.6*  MCV 86.1 86.5  PLT 284 161   Basic Metabolic Panel: Recent Labs  Lab 09/07/20 0444 09/07/20 1741 09/08/20 0539 09/08/20 1605 09/09/20 0435  NA 117* 121* 121* 120* 122*  123*  K 5.2* 5.2* 5.4* 4.7 4.5   4.7  CL 86* 87* 89* 90* 91*  93*  CO2 23 23 21* 23 23  23   GLUCOSE 138* 164* 149* 147* 138*  141*  BUN 23* 23* 25* 26* 28*  29*  CREATININE 1.46* 1.38* 1.39* 1.39* 1.32*  1.34*  CALCIUM 8.5* 8.7* 8.6* 8.2* 8.3*  8.3*  PHOS  --   --   --  4.3 4.3  4.4   Liver Function Tests: Recent Labs  Lab 09/04/20 0649 09/08/20 1605 09/09/20 0435  AST 22  --   --   ALT 16  --   --   ALKPHOS 50  --   --   BILITOT 0.5  --   --   PROT 6.7  --   --   ALBUMIN 3.0* 3.1* 3.0*  3.0*   No results for input(s): LIPASE, AMYLASE in the last 168 hours. No results for input(s): AMMONIA in the last 168 hours. Cardiac Enzymes: No results for input(s): CKTOTAL, CKMB, CKMBINDEX, TROPONINI  in the last 168 hours. BNP (last 3 results) Recent Labs    08/16/20 1122 08/20/20 0852 09/04/20 0649  BNP 1,319.4* 782.2* 494.9*    ProBNP (last 3 results) No results for input(s): PROBNP in the last 8760 hours.  CBG: Recent Labs  Lab 09/08/20 1212 09/08/20 1633 09/08/20 2230 09/09/20 0903 09/09/20 1205  GLUCAP 128* 140* 147* 145* 148*   Recent Results (from the past 240 hour(s))  SARS Coronavirus 2 by RT PCR (hospital order, performed in [facility] hospital lab) Nasopharyngeal Nasopharyngeal Swab     Status: None   Collection Time: 09/04/20  9:07 AM   Specimen: Nasopharyngeal Swab  Result Value Ref Range Status   SARS Coronavirus 2 NEGATIVE NEGATIVE Final    Comment: (NOTE) SARS-CoV-2 target nucleic acids are NOT DETECTED.  The SARS-CoV-2 RNA is generally detectable in upper and lower respiratory specimens during the acute phase of infection. The lowest concentration of SARS-CoV-2 viral copies this assay can detect is 250 copies / mL. A negative result does not preclude SARS-CoV-2 infection and should not be used as the sole basis for treatment or other patient management decisions.  A negative result may occur with improper specimen collection / handling, submission of specimen other than  nasopharyngeal swab, presence of viral mutation(s) within the areas targeted by this assay, and inadequate number of viral copies (<250 copies / mL). A negative result must be combined with clinical observations, patient history, and epidemiological information.  Fact Sheet for Patients:   StrictlyIdeas.no  Fact Sheet for Healthcare Providers: BankingDealers.co.za  This test is not yet approved or  cleared by the [place] FDA and has been authorized for detection and/or diagnosis of SARS-CoV-2 by FDA under an Emergency Use Authorization (EUA).  This EUA will remain in effect (meaning this test can be used) for the duration of the COVID-19 declaration under Section 564(b)(1) of the Act, 21 U.S.C. section 360bbb-3(b)(1), unless the authorization is terminated or revoked sooner.  Performed at [facility], [place] [address] [place], [place] [address]      Studies: No results found.   Flora Lipps, MD  [facility] 09/09/2020

## 2020-09-09 NOTE — Progress Notes (Signed)
Nephrology Follow-Up Consult note   Assessment/Recommendations: Brad [name] is a/an 59 y.o. male with a past medical history significant for alcohol use disorder, atrial fibrillation, CHF, HTN, CKD who present w/ hyponatremia   Hyponatremia: Likely multifactorial with beer Potomania and CHF contributing.  Sodium deficit was corrected with IV fluids and and sodium stalled with low urine sodium less than 10 likely now with volume overload causing sodium to be low.  Tolerated diuresis yesterday with small improvement in his sodium.  We will continue with oral diuretics today and continue to monitor. -Oral Lasix 40 mg twice daily -Continue to monitor sodium twice daily -If sodium is improved tomorrow likely can discharge with plans to follow-up with PCP and continue monitoring of labs outpatient and titration of diuretics  CHF: With reduced ejection fraction likely associated with alcohol.  Can continue BiDil at current dose.    Lasix as above  Hypertension: Controlled on BiDil  Alcohol use disorder: Patient interested in quitting and says he is weaning his alcohol use.  Encouraged and counseled on cessation.  CKD: Baseline creatinine likely 1.5-1.8 off and lower due to free water excess.  Likely will rise some with diuresis and correction of sodium  DM2 uncontrolled with hyperglycemia: insulin mgmt per primary team  Hyperkalemia: Improved with diuresis.  Stop Lokelma   Recommendations conveyed to primary service.    [facility] 09/09/2020 10:48 AM  ___________________________________________________________  CC: Hyponatremia  Interval History/Subjective: Patient states he feels well today without significant complaints.  Appetite is fine.  He urinated a significant amount yesterday.  About 3 L.  Sodium slightly improved to 123.  Patient denies confusion or nausea.   Medications:  Current Facility-Administered Medications  Medication Dose  Route Frequency Provider Last Rate Last Admin  . acetaminophen (TYLENOL) tablet 650 mg  650 mg Oral Q6H PRN Patrecia Pour, MD   650 mg at 09/06/20 0853   Or  . acetaminophen (TYLENOL) suppository 650 mg  650 mg Rectal Q6H PRN Patrecia Pour, MD      . aspirin chewable tablet 81 mg  81 mg Oral Daily Karmen Bongo, MD   81 mg at 09/09/20 0924  . atorvastatin (LIPITOR) tablet 20 mg  20 mg Oral q1800 Karmen Bongo, MD   20 mg at 09/08/20 1734  . enoxaparin (LOVENOX) injection 40 mg  40 mg Subcutaneous Q24H Karmen Bongo, MD   40 mg at 48/18/56 3149  . folic acid (FOLVITE) tablet 1 mg  1 mg Oral Daily Karmen Bongo, MD   1 mg at 09/09/20 0924  . furosemide (LASIX) tablet 40 mg  40 mg Oral BID Reesa Chew, MD   40 mg at 09/09/20 0924  . gabapentin (NEURONTIN) capsule 600 mg  600 mg Oral TID Patrecia Pour, MD   600 mg at 09/09/20 0924  . insulin aspart (novoLOG) injection 0-15 Units  0-15 Units Subcutaneous TID WC Karmen Bongo, MD   2 Units at 09/09/20 [id number]  . insulin aspart (novoLOG) injection 0-5 Units  0-5 Units Subcutaneous QHS Karmen Bongo, MD      . isosorbide-hydrALAZINE (BIDIL) 20-37.5 MG per tablet 1 tablet  1 tablet Oral TID Karmen Bongo, MD   1 tablet at 09/09/20 0924  . multivitamin with minerals tablet 1 tablet  1 tablet Oral Daily Karmen Bongo, MD   1 tablet at 09/09/20 0924  . nicotine (NICODERM CQ - dosed in mg/24 hours) patch 14 mg  14 mg Transdermal Daily Karmen Bongo,  MD   14 mg at 09/09/20 0925  . ondansetron (ZOFRAN) injection 4 mg  4 mg Intravenous Q6H PRN Karmen Bongo, MD   4 mg at 09/07/20 1038  . oxyCODONE (Oxy IR/ROXICODONE) immediate release tablet 5-10 mg  5-10 mg Oral Q4H PRN Patrecia Pour, MD   10 mg at 09/09/20 0924  . polyethylene glycol (MIRALAX / GLYCOLAX) packet 17 g  17 g Oral Once Pokhrel, Laxman, MD      . sodium chloride flush (NS) 0.9 % injection 3 mL  3 mL Intravenous Q12H Karmen Bongo, MD   3 mL at 09/09/20 0924  . sodium  zirconium cyclosilicate (LOKELMA) packet 5 g  5 g Oral Daily Patrecia Pour, MD   5 g at 09/09/20 0923  . thiamine tablet 100 mg  100 mg Oral Daily Karmen Bongo, MD   100 mg at 09/09/20 3810   Or  . thiamine (B-1) injection 100 mg  100 mg Intravenous Daily Karmen Bongo, MD   100 mg at 09/04/20 1335      Review of Systems: 10 systems reviewed and negative except per interval history/subjective  Physical Exam: Vitals:   09/08/20 2232 09/09/20 0510  BP: (!) 138/92 129/79  Pulse: (!) 55 (!) 58  Resp: 20 18  Temp: 97.9 F (36.6 C) 98.1 F (36.7 C)  SpO2: 99% 99%   Total I/O In: 240 [P.O.:240] Out: 500 [Urine:500]  Intake/Output Summary (Last 24 hours) at 09/09/2020 1048 Last data filed at 09/09/2020 1751 Gross per 24 hour  Intake 240 ml  Output 3050 ml  Net -2810 ml   Constitutional: well-appearing, no acute distress ENMT: ears and nose without scars or lesions, MMM CV: normal rate, trace edema in the bilateral lower extremities Respiratory: Bilateral chest rise, normal work of breathing Gastrointestinal: soft, non-tender, no palpable masses or hernias Skin: no visible lesions or rashes Psych: alert, judgement/insight appropriate, appropriate mood and affect   Test Results I personally reviewed new and old clinical labs and radiology tests Lab Results  Component Value Date   NA 123 (L) 09/09/2020   NA 122 (L) 09/09/2020   K 4.7 09/09/2020   K 4.5 09/09/2020   CL 93 (L) 09/09/2020   CL 91 (L) 09/09/2020   CO2 23 09/09/2020   CO2 23 09/09/2020   BUN 29 (H) 09/09/2020   BUN 28 (H) 09/09/2020   CREATININE 1.34 (H) 09/09/2020   CREATININE 1.32 (H) 09/09/2020   CALCIUM 8.3 (L) 09/09/2020   CALCIUM 8.3 (L) 09/09/2020   ALBUMIN 3.0 (L) 09/09/2020   ALBUMIN 3.0 (L) 09/09/2020   PHOS 4.4 09/09/2020   PHOS 4.3 09/09/2020

## 2020-09-10 ENCOUNTER — Encounter (HOSPITAL_COMMUNITY): Payer: Medicaid Other

## 2020-09-10 ENCOUNTER — Ambulatory Visit: Payer: Medicaid Other | Admitting: Family Medicine

## 2020-09-10 LAB — RENAL FUNCTION PANEL
Albumin: 2.8 g/dL — ABNORMAL LOW (ref 3.5–5.0)
Anion gap: 9 (ref 5–15)
BUN: 33 mg/dL — ABNORMAL HIGH (ref 6–20)
CO2: 22 mmol/L (ref 22–32)
Calcium: 8.2 mg/dL — ABNORMAL LOW (ref 8.9–10.3)
Chloride: 95 mmol/L — ABNORMAL LOW (ref 98–111)
Creatinine, Ser: 1.37 mg/dL — ABNORMAL HIGH (ref 0.61–1.24)
GFR calc Af Amer: >60 mL/min (ref >60)
GFR calc non Af Amer: 56 mL/min — ABNORMAL LOW (ref >60)
Glucose, Bld: 148 mg/dL — ABNORMAL HIGH (ref 70–99)
Phosphorus: 4.4 mg/dL (ref 2.5–4.6)
Potassium: 4.2 mmol/L (ref 3.5–5.1)
Sodium: 126 mmol/L — ABNORMAL LOW (ref 135–145)

## 2020-09-10 LAB — CBC
HCT: 29.1 % — ABNORMAL LOW (ref 39.0–52.0)
Hemoglobin: 9.1 g/dL — ABNORMAL LOW (ref 13.0–17.0)
MCH: 29.3 pg (ref 26.0–34.0)
MCHC: 31.3 g/dL (ref 30.0–36.0)
MCV: 93.6 fL (ref 80.0–100.0)
Platelets: 187 10*3/uL (ref 150–400)
RBC: 3.11 MIL/uL — ABNORMAL LOW (ref 4.22–5.81)
RDW: 15.5 % (ref 11.5–15.5)
WBC: 4.9 10*3/uL (ref 4.0–10.5)
nRBC: 0 % (ref 0.0–0.2)

## 2020-09-10 LAB — GLUCOSE, CAPILLARY: Glucose-Capillary: 146 mg/dL — ABNORMAL HIGH (ref 70–99)

## 2020-09-10 LAB — BASIC METABOLIC PANEL
Anion gap: 10 (ref 5–15)
BUN: 34 mg/dL — ABNORMAL HIGH (ref 6–20)
CO2: 21 mmol/L — ABNORMAL LOW (ref 22–32)
Calcium: 8.2 mg/dL — ABNORMAL LOW (ref 8.9–10.3)
Chloride: 95 mmol/L — ABNORMAL LOW (ref 98–111)
Creatinine, Ser: 1.4 mg/dL — ABNORMAL HIGH (ref 0.61–1.24)
GFR calc Af Amer: >60 mL/min (ref >60)
GFR calc non Af Amer: 54 mL/min — ABNORMAL LOW (ref >60)
Glucose, Bld: 147 mg/dL — ABNORMAL HIGH (ref 70–99)
Potassium: 4.2 mmol/L (ref 3.5–5.1)
Sodium: 126 mmol/L — ABNORMAL LOW (ref 135–145)

## 2020-09-10 LAB — PHOSPHORUS: Phosphorus: 4.5 mg/dL (ref 2.5–4.6)

## 2020-09-10 LAB — MAGNESIUM: Magnesium: 1.7 mg/dL (ref 1.7–2.4)

## 2020-09-10 MED ORDER — FUROSEMIDE 40 MG PO TABS
40.0000 mg | ORAL_TABLET | Freq: Two times a day (BID) | ORAL | 2 refills | Status: DC
Start: 2020-09-10 — End: 2020-10-21

## 2020-09-10 MED ORDER — NICOTINE 14 MG/24HR TD PT24: 14.0000 mg | MEDICATED_PATCH | Freq: Every day | TRANSDERMAL | 0 refills | Status: DC

## 2020-09-10 NOTE — TOC Progression Note (Signed)
Transition of Care [facility]) - Progression Note    Patient Details  Name: Brad [name] MRN: [id number] Date of Birth: 10-20-1961  Transition of Care [facility]) CM/SW Contact  Purcell Mouton, RN Phone Number: 09/10/2020, 4:01 PM  Clinical Narrative:    Bus Pass given to RN for pt to transport home.    Expected Discharge Plan: Home/Self Care Barriers to Discharge: No Barriers Identified  Expected Discharge Plan and Services Expected Discharge Plan: Home/Self Care       Living arrangements for the past 2 months: Single Family Home Expected Discharge Date: 09/10/20                                     Social Determinants of Health (SDOH) Interventions    Readmission Risk Interventions Readmission Risk Prevention Plan 08/17/2020 07/23/2020 06/13/2020  Transportation Screening Complete Complete Complete  PCP or Specialist Appt within 5-7 Days - - -  Home Care Screening - - -  Medication Review (RN CM) - - -  Medication Review (RN Transport planner) - Referral to Pharmacy Complete  PCP or Specialist appointment within 3-5 days of discharge Complete Complete Complete  PCP/Specialist Appt Not Complete comments - - -  August or Home Care Consult Complete Complete Complete  HRI or Home Care Consult Pt Refusal Comments - - -  SW Recovery Care/Counseling Consult Complete Complete Complete  Palliative Care Screening Not Applicable Not Applicable Not Applicable  Skilled Nursing Facility Not Applicable Complete Complete  Some recent data might be hidden

## 2020-09-10 NOTE — Progress Notes (Signed)
Patient provided Discharge teaching and AVS documents. New medication changes explained to patient and patient's questions were answered. Patient had no further questions. Arranging transportation services forpatient to go home. Patient currently in no distress.

## 2020-09-10 NOTE — Progress Notes (Signed)
Patient being discharged home in stable condition.

## 2020-09-10 NOTE — Progress Notes (Signed)
Physical Therapy Treatment Patient Details Name: Brad [name] MRN: [id number] DOB: 02-Apr-1961 Today's Date: 09/10/2020    History of Present Illness Brad [name] is a 59 y.o. male with medical history significant of ETOH abuse, Afib, CHF, HTN with recurrent acute on chronic hyponatremia presenting with the same (12 admissions and 5 ER visits in the last 6 months for various reasons).    PT Comments    Pt ambulated good distance in hallway and hopeful for d/c soon.  Follow Up Recommendations  Supervision - Intermittent     Equipment Recommendations  None recommended by PT    Recommendations for Other Services       Precautions / Restrictions Precautions Precautions: Fall Precaution Comments: hx myoclonic jerking    Mobility  Bed Mobility Overal bed mobility: Modified Independent                Transfers Overall transfer level: Needs assistance Equipment used: None Transfers: Sit to/from Stand Sit to Stand: Min guard;Supervision            Ambulation/Gait Ambulation/Gait assistance: Min Gaffer (Feet): 400 Feet Assistive device: None Gait Pattern/deviations: Step-through pattern;Decreased stride length     General Gait Details: maintains bil knee flexion (R>L) throughout gait cycle (chronic), no unsteadiness or LOB   Stairs             Wheelchair Mobility    Modified [name] (Stroke Patients Only)       Balance                                            Cognition Arousal/Alertness: Awake/alert Behavior During Therapy: WFL for tasks assessed/performed Overall Cognitive Status: Within Functional Limits for tasks assessed                                        Exercises      General Comments        Pertinent Vitals/Pain Pain Assessment: 0-10 Pain Score: 10-Worst pain ever Pain Location: neck Pain Descriptors / Indicators: Aching Pain Intervention(s):  Repositioned;RN gave pain meds during session;Monitored during session (RN brought meds end of session)    Home Living                      Prior Function            PT Goals (current goals can now be found in the care plan section) Progress towards PT goals: Progressing toward goals    Frequency    Min 3X/week      PT Plan Current plan remains appropriate    Co-evaluation              AM-PAC PT "6 Clicks" Mobility   Outcome Measure  Help needed turning from your back to your side while in a flat bed without using bedrails?: None Help needed moving from lying on your back to sitting on the side of a flat bed without using bedrails?: None Help needed moving to and from a bed to a chair (including a wheelchair)?: None Help needed standing up from a chair using your arms (e.g., wheelchair or bedside chair)?: None Help needed to walk in hospital room?: None Help needed climbing 3-5 steps with a railing? : A Little 6 Click  Score: 23    End of Session   Activity Tolerance: Patient tolerated treatment well Patient left: in bed;with call bell/phone within reach;with nursing/sitter in room Nurse Communication: Mobility status PT Visit Diagnosis: Other abnormalities of gait and mobility (R26.89);History of falling (Z91.81)     Time: 2831-5176 PT Time Calculation (min) (ACUTE ONLY): 11 min  Charges:  $Gait Training: 8-22 mins                    Arlyce Dice, DPT Acute Rehabilitation Services Pager: [phone] Office: [phone]   Trena Platt 09/10/2020, 12:45 PM

## 2020-09-10 NOTE — Discharge Summary (Signed)
Physician Discharge Summary  Brad [name] KVQ:[id number] DOB: 1961/07/08 DOA: 09/04/2020  PCP: Charlott Rakes, MD  Admit date: 09/04/2020 Discharge date: 09/10/2020  Admitted From: Home Disposition: Home Recommendations for Outpatient Follow-up:  1. Follow up with PCP this week  2. please obtain BMP/CBC this week   Home Health none Equipment/Devices: None Discharge Condition stable CODE STATUS: Full code Diet recommendation: Cardiac Brief/Interim Summary:Thoams Puryearis a 59 y.o.malewith medical history significant ofETOH abuse, Afib, CHF, HTN withrecurrent acute onchronic hyponatremia  with recurrent admissions to the hospital presented to hospital with paresthesia, numbness in his hands and feet and was having difficulty walking. He had surgery about 6 weeks ago for C4-C6 disease and underwent surgical fixation on 08/24/2020.  Patient presented with sodium of 112 on this admission in patient was admitted for further evaluation and treatment.  Neurosurgery felt no further evaluation or management was required for paresthesias.Nephrology was consulted for management of hyponatremia.  Discharge Diagnoses:  Principal Problem:   Hyponatremia with normal extracellular fluid volume Active Problems:   DM type 2 (diabetes mellitus, type 2) (HCC)   CKD (chronic kidney disease) stage 3, GFR 30-59 ml/min   Chronic atrial fibrillation   Alcohol dependence syndrome (HCC)   Tobacco abuse   Chest pain   HTN (hypertension)   HLD (hyperlipidemia)   Chronic combined systolic and diastolic CHF (congestive heart failure) (HCC)   Acute on chronic hyponatremia: Likely multifactorial.  Patient does have history of chronic hyponatremia.  urine osmolality done on 09/07/2020 was low at 180.  Urinary sodium less than 10.  Patient was treated with lasix and will be discharged on lasix.  Seen by nephrology.na is 126 on dc. Myoclonic jerks: Chronic issue.  Gets worse with hyponatremia as per the  patient.    Paresthesias: Likely exacerbated by severe hyponatremia.  Improving..  S/p Anterior cervical discectomy and fusion of C4-C6 Jul 24, 2020, paresthesias were present prior and may continue long-term. Motor function intact.  Neurosurgery  Dr. Reatha Armour does not recommend further evaluation for this.  Gabapentin dose has been increased to 3 times daily.  Chronic combined HFrEF: LVEF 35-40% in April 2021. Continue aspirin, Lipitor,amiodrone  BiDil and Lasix.  Digoxin stopped due to bradycardia  Hyperkalemia: Resolved potassium 4.2 on discharge.  Chronic AFib with bradycardic ventricular response: Continue amiodarone.  Digoxin DC'd this patient have been bradycardic.   Chest pain: Likely atypical related to keloid.  Patient does have chronic elevated troponins.  EKG unremarkable.   Continue bidil.  Hyperlipidemia Continue statin  EtOH dependence and withdrawal: Thinking about quitting.      Tobacco use:  On nicotine patch.  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  Diuretics has been initiated.  Stage IIIa CKD:  Creatinine of 1.3 on dc  Type 2 diabetes mellitus- continue farxiga   Estimated body mass index is 25.87 kg/m as calculated from the following:   Height as of this encounter: 5\' 11"  (1.803 m).   Weight as of this encounter: 84.1 kg.  Discharge Instructions  Discharge Instructions    Diet - low sodium heart healthy   Complete by: As directed    Increase activity slowly   Complete by: As directed    No wound care   Complete by: As directed      Allergies as of 09/10/2020      Reactions   Angiotensin Receptor Blockers Anaphylaxis, Other (See Comments)   (Angioedema also with Lisinopril, therefore ARB's are contraindicated)  Lisinopril Anaphylaxis, Swelling   Throat swelling   Pamelor [nortriptyline Hcl] Anaphylaxis, Swelling   Throat swells      Medication List    STOP taking these medications   digoxin 0.125  MG tablet Commonly known as: LANOXIN   spironolactone 25 MG tablet Commonly known as: ALDACTONE   torsemide 20 MG tablet Commonly known as: DEMADEX     TAKE these medications   amiodarone 200 MG tablet Commonly known as: PACERONE Take 1 tablet (200 mg total) by mouth daily.   atorvastatin 20 MG tablet Commonly known as: LIPITOR Take 1 tablet (20 mg total) by mouth daily at 6 PM.   dapagliflozin propanediol 5 MG Tabs tablet Commonly known as: Farxiga Take 1 tablet (5 mg total) by mouth daily before breakfast.   furosemide 40 MG tablet Commonly known as: LASIX Take 1 tablet (40 mg total) by mouth 2 (two) times daily.   gabapentin 300 MG capsule Commonly known as: NEURONTIN Take 2 capsules (600 mg total) by mouth 2 (two) times daily.   isosorbide-hydrALAZINE 20-37.5 MG tablet Commonly known as: BIDIL Take 1 tablet by mouth 3 (three) times daily.   nicotine 14 mg/24hr patch Commonly known as: NICODERM CQ - dosed in mg/24 hours Place 1 patch (14 mg total) onto the skin daily. Start taking on: September 11, 2020   oxyCODONE-acetaminophen 5-325 MG tablet Commonly known as: PERCOCET/ROXICET Take 1 tablet by mouth every 6 (six) hours as needed for pain. LF on 08-23-20 # 30   potassium chloride SA 20 MEQ tablet Commonly known as: KLOR-CON Take 1 tablet (20 mEq total) by mouth daily.       Follow-up Information    Charlott Rakes, MD Follow up.   Specialty: Family Medicine Why: check bmp 9/22 to make sure sodium is improved Contact information: [place] [address] [phone]        [name], MD .   Specialty: Cardiology Contact information: [address] [place]  [address] [phone]              Allergies  Allergen Reactions  . Angiotensin Receptor Blockers Anaphylaxis and Other (See Comments)    (Angioedema also with Lisinopril, therefore ARB's are contraindicated)  . Lisinopril Anaphylaxis and  Swelling    Throat swelling  . Pamelor [Nortriptyline Hcl] Anaphylaxis and Swelling    Throat swells    Consultations:  Nephrology   Procedures/Studies: DG Chest 2 View  Result Date: 09/04/2020 CLINICAL DATA:  Shortness of breath EXAM: CHEST - 2 VIEW COMPARISON:  08/22/2020 FINDINGS: Cardiomegaly. There is no edema, consolidation, effusion, or pneumothorax. No acute osseous finding. IMPRESSION: No acute finding. Chronic cardiomegaly. Electronically Signed   By: Monte Fantasia M.D.   On: 09/04/2020 07:12   CT Cervical Spine Wo Contrast  Result Date: 08/16/2020 CLINICAL DATA:  Pain following recent fall EXAM: CT CERVICAL SPINE WITHOUT CONTRAST TECHNIQUE: Multidetector CT imaging of the cervical spine was performed without intravenous contrast. Multiplanar CT image reconstructions were also generated. COMPARISON:  May 03, 2020 FINDINGS: Alignment: There is no appreciable spondylolisthesis. Skull base and vertebrae: Skull base and craniocervical junction regions appear normal. The patient is status post anterior screw and plate fixation from B6-L8 with disc spacers present at C4-5 and C5-6. Support hardware intact. No acute fracture. No blastic or lytic bone lesions. Soft tissues and spinal canal: The prevertebral soft tissues and predental space regions are normal. There is no appreciable cord or canal hematoma. No paraspinous lesions  are evident. Disc levels: The disc spacers appear intact with disc spacers present at C4-5 and C5-6. There is there is diffuse disc protrusion at C4-5 with moderate spinal stenosis at this level. There is milder spinal stenosis at C5-6. There is severe exit foraminal narrowing bilaterally with impression on the exiting nerve roots bilaterally at C5-6. There is exit foraminal narrowing due to asymmetric disc protrusion at C6-7 on the left. Upper chest: Visualized upper lung regions are clear. Other: There is calcification in each proximal subclavian artery as well as in  each carotid artery. IMPRESSION: 1.  No acute fracture or spondylolisthesis. 2. Status post anterior fusion from C4-C6 with disc spacers at C4-5 and C5-6. Support hardware intact. 3. Moderate spinal stenosis due to disc protrusion and bony hypertrophy at C4-5. Mild spinal stenosis at C5-6 with severe exit foraminal narrowing bilaterally due to bony hypertrophy. 4. Foci of calcification in each proximal subclavian and mid carotid artery region. Electronically Signed   By: Lowella Grip III M.D.   On: 08/16/2020 11:21   DG CHEST PORT 1 VIEW  Result Date: 08/22/2020 CLINICAL DATA:  Increasing shortness of breath EXAM: PORTABLE CHEST 1 VIEW COMPARISON:  Two days ago FINDINGS: Cardiomegaly and symmetric vascular prominence. No Kerley lines, effusion, or air leak. IMPRESSION: Cardiomegaly and venous congestion without progression from 2 days ago. Electronically Signed   By: Monte Fantasia M.D.   On: 08/22/2020 05:15   DG Chest Port 1 View  Result Date: 08/20/2020 CLINICAL DATA:  Cough, shortness of breath, and mid chest pain this morning; history atrial fibrillation/flutter, CHF, cirrhosis, diabetes mellitus, ethanol abuse, hypertension, non ischemic cardiomyopathy, smoker, polysubstance abuse EXAM: PORTABLE CHEST 1 VIEW COMPARISON:  Portable exam 0904 hours compared to 07/31/2020 FINDINGS: Enlargement of cardiac silhouette with pulmonary vascular congestion. Mediastinal contours normal for slight degree of rotation to the LEFT. Linear subsegmental atelectasis LEFT mid lung. Hazy infiltrates in the lungs bilaterally, favor mild pulmonary edema though infection not entirely exclude. No pleural effusion or pneumothorax. Prior cervical spine fusion. IMPRESSION: Enlargement of cardiac silhouette with vascular congestion and probable mild pulmonary edema as above. Linear subsegmental atelectasis LEFT upper lobe. Electronically Signed   By: Lavonia Dana M.D.   On: 08/20/2020 09:17   DG Cervical Spine 2-3 View  Clearing  Result Date: 09/04/2020 CLINICAL DATA:  Fall at home complaining of neck pain and shortness of breath. EXAM: LIMITED CERVICAL SPINE FOR TRAUMA CLEARING - 2-3 VIEW COMPARISON:  July 30, 2020 FINDINGS: Decreased prevertebral soft tissue swelling when compared to postoperative radiograph from August 20, 2020. Cervical spine with limited assessment of the cervicothoracic junction on today's exam only the superior most aspect of the T1 vertebral body is imaged. No sign of static subluxation.  No visible fracture. Limited assessment also of the upper cervical spine on the AP view, no open-mouth view is provided. IMPRESSION: 1. Decreased prevertebral soft tissue swelling when compared to postoperative radiograph from August 20, 2020. 2. No signs of fracture or static subluxation, limited assessment of the craniocervical junction and cervicothoracic junction as described. Electronically Signed   By: Zetta Bills M.D.   On: 09/04/2020 10:38   DG Finger Thumb Right  Result Date: 09/04/2020 CLINICAL DATA:  [place] several days ago with pain and swelling. EXAM: RIGHT THUMB 2+V COMPARISON:  08/16/2020 FINDINGS: Redemonstration of a comminuted fracture of the distal phalanx of the thumb and of a comminuted fracture at the base of the proximal phalanx of the thumb with intra-articular extension. No change  in position or alignment. IMPRESSION: Comminuted fractures of the distal phalanx and proximal phalanx of the thumb. No change in position or alignment. Electronically Signed   By: Nelson Chimes M.D.   On: 09/04/2020 12:37   DG Finger Thumb Right  Result Date: 08/16/2020 CLINICAL DATA:  Injury 2 thumb, status post fall 2 days ago. EXAM: RIGHT THUMB 2+V COMPARISON:  None FINDINGS: Comminuted fracture with mild displacement of the distal phalanx of the RIGHT thumb. Soft tissue swelling overlying the RIGHT thumb. Comminuted T-shaped fracture at the base of the proximal phalanx. Some impaction of the fracture and  evidence of intra-articular extension. Mild anterior displacement of the distal aspect of the proximal phalanx IMPRESSION: Comminuted fractures of the distal and proximal phalanges of the RIGHT thumb, as described above. Intra-articular extension noted about the proximal phalangeal fracture at the base of the proximal phalanx. Comminuted fracture at the distal phalanx underlies the nail bed. Correlate with any signs that would indicate the possibility "open fracture" with nail bed injury. Electronically Signed   By: Zetta Bills M.D.   On: 08/16/2020 08:22    (Echo, Carotid, EGD, Colonoscopy, ERCP)    Subjective:  Sitting up by the side of the bed anxious to go home no new complaints no events overnight. Discharge Exam: Vitals:   09/10/20 0500 09/10/20 1327  BP:  123/77  Pulse:  (!) 57  Resp:  18  Temp: 97.8 F (36.6 C) 98 F (36.7 C)  SpO2:  99%   Vitals:   09/10/20 0439 09/10/20 0500 09/10/20 0824 09/10/20 1327  BP: 111/73   123/77  Pulse: 74   (!) 57  Resp: 18   18  Temp:  97.8 F (36.6 C)  98 F (36.7 C)  TempSrc:    Oral  SpO2: 99%   99%  Weight:   84.1 kg   Height:        General: Pt is alert, awake, not in acute distress Cardiovascular: RRR, S1/S2 +, no rubs, no gallops Respiratory: CTA bilaterally, no wheezing, no rhonchi Abdominal: Soft, NT, ND, bowel sounds + Extremities: no edema, no cyanosis    The results of significant diagnostics from this hospitalization (including imaging, microbiology, ancillary and laboratory) are listed below for reference.     Microbiology: Recent Results (from the past 240 hour(s))  SARS Coronavirus 2 by RT PCR (hospital order, performed in [facility] hospital lab) Nasopharyngeal Nasopharyngeal Swab     Status: None   Collection Time: 09/04/20  9:07 AM   Specimen: Nasopharyngeal Swab  Result Value Ref Range Status   SARS Coronavirus 2 NEGATIVE NEGATIVE Final    Comment: (NOTE) SARS-CoV-2 target nucleic acids are NOT  DETECTED.  The SARS-CoV-2 RNA is generally detectable in upper and lower respiratory specimens during the acute phase of infection. The lowest concentration of SARS-CoV-2 viral copies this assay can detect is 250 copies / mL. A negative result does not preclude SARS-CoV-2 infection and should not be used as the sole basis for treatment or other patient management decisions.  A negative result may occur with improper specimen collection / handling, submission of specimen other than nasopharyngeal swab, presence of viral mutation(s) within the areas targeted by this assay, and inadequate number of viral copies (<250 copies / mL). A negative result must be combined with clinical observations, patient history, and epidemiological information.  Fact Sheet for Patients:   StrictlyIdeas.no  Fact Sheet for Healthcare Providers: BankingDealers.co.za  This test is not yet approved or  cleared  by the [place] and has been authorized for detection and/or diagnosis of SARS-CoV-2 by FDA under an Emergency Use Authorization (EUA).  This EUA will remain in effect (meaning this test can be used) for the duration of the COVID-19 declaration under Section 564(b)(1) of the Act, 21 U.S.C. section 360bbb-3(b)(1), unless the authorization is terminated or revoked sooner.  Performed at [facility], [place] [address] [place], [place] [address]      Labs: BNP (last 3 results) Recent Labs    08/16/20 1122 08/20/20 0852 09/04/20 0649  BNP 1,319.4* 782.2* 790.3*   Basic Metabolic Panel: Recent Labs  Lab 09/07/20 1741 09/08/20 0539 09/08/20 1605 09/09/20 0435 09/10/20 0425  NA 121* 121* 120* 122*  123* 126*  126*  K 5.2* 5.4* 4.7 4.5  4.7 4.2  4.2  CL 87* 89* 90* 91*  93* 95*  95*  CO2 23 21* 23 23  23 22   21*  GLUCOSE 164* 149* 147* 138*  141* 148*  147*  BUN 23* 25* 26* 28*  29* 33*  34*  CREATININE 1.38*  1.39* 1.39* 1.32*  1.34* 1.37*  1.40*  CALCIUM 8.7* 8.6* 8.2* 8.3*  8.3* 8.2*  8.2*  MG  --   --   --   --  1.7  PHOS  --   --  4.3 4.3  4.4 4.4  4.5   Liver Function Tests: Recent Labs  Lab 09/04/20 0649 09/08/20 1605 09/09/20 0435 09/10/20 0425  AST 22  --   --   --   ALT 16  --   --   --   ALKPHOS 50  --   --   --   BILITOT 0.5  --   --   --   PROT 6.7  --   --   --   ALBUMIN 3.0* 3.1* 3.0*  3.0* 2.8*   No results for input(s): LIPASE, AMYLASE in the last 168 hours. No results for input(s): AMMONIA in the last 168 hours. CBC: Recent Labs  Lab 09/04/20 0649 09/05/20 0640 09/10/20 0425  WBC 4.2 3.9* 4.9  NEUTROABS 2.8  --   --   HGB 11.2* 11.1* 9.1*  HCT 32.7* 32.6* 29.1*  MCV 86.1 86.5 93.6  PLT 284 252 187   Cardiac Enzymes: No results for input(s): CKTOTAL, CKMB, CKMBINDEX, TROPONINI in the last 168 hours. BNP: Invalid input(s): POCBNP CBG: Recent Labs  Lab 09/09/20 0903 09/09/20 1205 09/09/20 1716 09/09/20 2126 09/10/20 0737  GLUCAP 145* 148* 146* 193* 146*   D-Dimer No results for input(s): DDIMER in the last 72 hours. Hgb A1c No results for input(s): HGBA1C in the last 72 hours. Lipid Profile No results for input(s): CHOL, HDL, LDLCALC, TRIG, CHOLHDL, LDLDIRECT in the last 72 hours. Thyroid function studies No results for input(s): TSH, T4TOTAL, T3FREE, THYROIDAB in the last 72 hours.  Invalid input(s): FREET3 Anemia work up No results for input(s): VITAMINB12, FOLATE, FERRITIN, TIBC, IRON, RETICCTPCT in the last 72 hours. Urinalysis    Component Value Date/Time   COLORURINE YELLOW 08/16/2020 1155   APPEARANCEUR CLEAR 08/16/2020 1155   LABSPEC 1.005 08/16/2020 1155   PHURINE 5.0 08/16/2020 1155   GLUCOSEU NEGATIVE 08/16/2020 1155   HGBUR SMALL (A) 08/16/2020 1155   BILIRUBINUR NEGATIVE 08/16/2020 1155   BILIRUBINUR neg 05/08/2017 1503   KETONESUR NEGATIVE 08/16/2020 1155   PROTEINUR 100 (A) 08/16/2020 1155   UROBILINOGEN 0.2  05/08/2017 1503   UROBILINOGEN 1.0 04/26/2012 1328   NITRITE  NEGATIVE 08/16/2020 [place] 08/16/2020 1155   Sepsis Labs Invalid input(s): PROCALCITONIN,  WBC,  LACTICIDVEN Microbiology Recent Results (from the past 240 hour(s))  SARS Coronavirus 2 by RT PCR (hospital order, performed in [facility] hospital lab) Nasopharyngeal Nasopharyngeal Swab     Status: None   Collection Time: 09/04/20  9:07 AM   Specimen: Nasopharyngeal Swab  Result Value Ref Range Status   SARS Coronavirus 2 NEGATIVE NEGATIVE Final    Comment: (NOTE) SARS-CoV-2 target nucleic acids are NOT DETECTED.  The SARS-CoV-2 RNA is generally detectable in upper and lower respiratory specimens during the acute phase of infection. The lowest concentration of SARS-CoV-2 viral copies this assay can detect is 250 copies / mL. A negative result does not preclude SARS-CoV-2 infection and should not be used as the sole basis for treatment or other patient management decisions.  A negative result may occur with improper specimen collection / handling, submission of specimen other than nasopharyngeal swab, presence of viral mutation(s) within the areas targeted by this assay, and inadequate number of viral copies (<250 copies / mL). A negative result must be combined with clinical observations, patient history, and epidemiological information.  Fact Sheet for Patients:   StrictlyIdeas.no  Fact Sheet for Healthcare Providers: BankingDealers.co.za  This test is not yet approved or  cleared by the [place] FDA and has been authorized for detection and/or diagnosis of SARS-CoV-2 by FDA under an Emergency Use Authorization (EUA).  This EUA will remain in effect (meaning this test can be used) for the duration of the COVID-19 declaration under Section 564(b)(1) of the Act, 21 U.S.C. section 360bbb-3(b)(1), unless the authorization is terminated or revoked  sooner.  Performed at [facility], [place] [address], [place], [place] [address]      Time coordinating discharge:  38 minutes  SIGNED:   Georgette Shell, MD  [facility] 09/10/2020, 2:38 PM

## 2020-09-10 NOTE — Telephone Encounter (Signed)
Call returned to the patient and he said he is still in the hospital but hoping to be discharged soon.  He explained that he remains on the wait list for an apartment and he understands that he needs to manage his money so he will be able to pay rent as well as obtain other necessary items for the apartment.

## 2020-09-11 ENCOUNTER — Telehealth: Payer: Self-pay

## 2020-09-11 ENCOUNTER — Other Ambulatory Visit (HOSPITAL_COMMUNITY): Payer: Self-pay

## 2020-09-11 LAB — GLUCOSE, CAPILLARY: Glucose-Capillary: 130 mg/dL — ABNORMAL HIGH (ref 70–99)

## 2020-09-11 NOTE — Telephone Encounter (Signed)
Opened in error

## 2020-09-11 NOTE — Telephone Encounter (Signed)
Transition Care Management Follow-up Telephone Call  Call placed to patient # [phone], message left with call back requested to this CM.  This CM then spoke to [name], EMT who had just seen the patient at home  Date of discharge and from where: 09/10/2020, [facility]   How have you been since you were released from the hospital? Nira Conn stated he was fine when she saw him. Vital signs stable, no edema.   Any questions or concerns?  none   Items Reviewed:  Did the pt receive and understand the discharge instructions provided?  yes  Medications obtained and verified?  Nira Conn said that he has all medications and she prefilled his med box for the week.  She oversees his medication management.   Any new allergies since your discharge?  none reported   Do you have support at home?  his roommate, [name]  Currently on the waiting list for housing with [employer].  Community Paramedic, [name], EMT visits patient weekly  Functional Questionnaire: (I = Independent and D = Dependent) ADLs:independent  Follow up appointments reviewed:   PCP Hospital f/u appt confirmed? needs to schedule appointment   [facility] f/u appt confirmed? needs appointment with heart failure clinic  Are transportation arrangements needed?  no  If their condition worsens, is the pt aware to call PCP or go to the Emergency Dept.? yes  Was the patient provided with contact information for the PCP's office or ED?  he has the phone number for [facility]
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
## 2020-09-14 ENCOUNTER — Telehealth (HOSPITAL_COMMUNITY): Payer: Self-pay | Admitting: Licensed Clinical Social Worker
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
## 2020-09-17 ENCOUNTER — Other Ambulatory Visit (HOSPITAL_COMMUNITY): Payer: Self-pay
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
## 2020-09-18 ENCOUNTER — Observation Stay (HOSPITAL_COMMUNITY)
Admission: EM | Admit: 2020-09-18 | Discharge: 2020-09-19 | Disposition: A | Discharge status: Home / Self Care | Payer: Medicaid Other | Attending: Internal Medicine | Admitting: Internal Medicine

## 2020-09-18 ENCOUNTER — Other Ambulatory Visit: Payer: Self-pay

## 2020-09-18 ENCOUNTER — Emergency Department (HOSPITAL_COMMUNITY): Payer: Medicaid Other

## 2020-09-18 ENCOUNTER — Emergency Department (HOSPITAL_COMMUNITY)
Admission: EM | Admit: 2020-09-18 | Discharge: 2020-09-18 | Discharge status: Against Medical Advice | Payer: Medicaid Other

## 2020-09-18 DIAGNOSIS — F1910 Other psychoactive substance abuse, uncomplicated: Secondary | ICD-10-CM | POA: Diagnosis not present

## 2020-09-18 DIAGNOSIS — R079 Chest pain, unspecified: Secondary | ICD-10-CM | POA: Diagnosis present

## 2020-09-18 DIAGNOSIS — I2510 Atherosclerotic heart disease of native coronary artery without angina pectoris: Secondary | ICD-10-CM | POA: Insufficient documentation

## 2020-09-18 DIAGNOSIS — E1122 Type 2 diabetes mellitus with diabetic chronic kidney disease: Secondary | ICD-10-CM | POA: Insufficient documentation

## 2020-09-18 DIAGNOSIS — Z79899 Other long term (current) drug therapy: Secondary | ICD-10-CM | POA: Diagnosis not present

## 2020-09-18 DIAGNOSIS — I10 Essential (primary) hypertension: Secondary | ICD-10-CM | POA: Diagnosis present

## 2020-09-18 DIAGNOSIS — I5042 Chronic combined systolic (congestive) and diastolic (congestive) heart failure: Secondary | ICD-10-CM | POA: Insufficient documentation

## 2020-09-18 DIAGNOSIS — F1020 Alcohol dependence, uncomplicated: Secondary | ICD-10-CM | POA: Diagnosis present

## 2020-09-18 DIAGNOSIS — F17210 Nicotine dependence, cigarettes, uncomplicated: Secondary | ICD-10-CM | POA: Diagnosis not present

## 2020-09-18 DIAGNOSIS — Z20822 Contact with and (suspected) exposure to covid-19: Secondary | ICD-10-CM | POA: Diagnosis not present

## 2020-09-18 DIAGNOSIS — R0789 Other chest pain: Secondary | ICD-10-CM | POA: Diagnosis not present

## 2020-09-18 DIAGNOSIS — I130 Hypertensive heart and chronic kidney disease with heart failure and stage 1 through stage 4 chronic kidney disease, or unspecified chronic kidney disease: Secondary | ICD-10-CM | POA: Insufficient documentation

## 2020-09-18 DIAGNOSIS — N179 Acute kidney failure, unspecified: Secondary | ICD-10-CM | POA: Diagnosis present

## 2020-09-18 DIAGNOSIS — I5043 Acute on chronic combined systolic (congestive) and diastolic (congestive) heart failure: Secondary | ICD-10-CM | POA: Diagnosis present

## 2020-09-18 DIAGNOSIS — N183 Chronic kidney disease, stage 3 unspecified: Secondary | ICD-10-CM | POA: Diagnosis present

## 2020-09-18 DIAGNOSIS — E119 Type 2 diabetes mellitus without complications: Secondary | ICD-10-CM

## 2020-09-18 DIAGNOSIS — E871 Hypo-osmolality and hyponatremia: Secondary | ICD-10-CM | POA: Diagnosis not present

## 2020-09-18 DIAGNOSIS — Z7984 Long term (current) use of oral hypoglycemic drugs: Secondary | ICD-10-CM | POA: Diagnosis not present

## 2020-09-18 DIAGNOSIS — K219 Gastro-esophageal reflux disease without esophagitis: Secondary | ICD-10-CM | POA: Diagnosis present

## 2020-09-18 DIAGNOSIS — Z8546 Personal history of malignant neoplasm of prostate: Secondary | ICD-10-CM | POA: Insufficient documentation

## 2020-09-18 LAB — CBC WITH DIFFERENTIAL/PLATELET
Abs Immature Granulocytes: 0.02 10*3/uL (ref 0.00–0.07)
Basophils Absolute: 0.1 10*3/uL (ref 0.0–0.1)
Basophils Relative: 1 %
Eosinophils Absolute: 0.1 10*3/uL (ref 0.0–0.5)
Eosinophils Relative: 1 %
HCT: 34.3 % — ABNORMAL LOW (ref 39.0–52.0)
Hemoglobin: 11.4 g/dL — ABNORMAL LOW (ref 13.0–17.0)
Immature Granulocytes: 1 %
Lymphocytes Relative: 14 %
Lymphs Abs: 0.6 10*3/uL — ABNORMAL LOW (ref 0.7–4.0)
MCH: 28.2 pg (ref 26.0–34.0)
MCHC: 33.2 g/dL (ref 30.0–36.0)
MCV: 84.9 fL (ref 80.0–100.0)
Monocytes Absolute: 0.8 10*3/uL (ref 0.1–1.0)
Monocytes Relative: 19 %
Neutro Abs: 2.6 10*3/uL (ref 1.7–7.7)
Neutrophils Relative %: 64 %
Platelets: 347 10*3/uL (ref 150–400)
RBC: 4.04 MIL/uL — ABNORMAL LOW (ref 4.22–5.81)
RDW: 14.9 % (ref 11.5–15.5)
WBC: 4 10*3/uL (ref 4.0–10.5)
nRBC: 0 % (ref 0.0–0.2)

## 2020-09-18 LAB — LIPASE, BLOOD: Lipase: 43 U/L (ref 11–51)

## 2020-09-18 LAB — TROPONIN I (HIGH SENSITIVITY): Troponin I (High Sensitivity): 38 ng/L — ABNORMAL HIGH (ref <18)

## 2020-09-18 LAB — COMPREHENSIVE METABOLIC PANEL
ALT: 27 U/L (ref 0–44)
AST: 46 U/L — ABNORMAL HIGH (ref 15–41)
Albumin: 2.8 g/dL — ABNORMAL LOW (ref 3.5–5.0)
Alkaline Phosphatase: 51 U/L (ref 38–126)
Anion gap: 11 (ref 5–15)
BUN: 13 mg/dL (ref 6–20)
CO2: 20 mmol/L — ABNORMAL LOW (ref 22–32)
Calcium: 8.1 mg/dL — ABNORMAL LOW (ref 8.9–10.3)
Chloride: 90 mmol/L — ABNORMAL LOW (ref 98–111)
Creatinine, Ser: 1.33 mg/dL — ABNORMAL HIGH (ref 0.61–1.24)
GFR calc Af Amer: >60 mL/min (ref >60)
GFR calc non Af Amer: 58 mL/min — ABNORMAL LOW (ref >60)
Glucose, Bld: 111 mg/dL — ABNORMAL HIGH (ref 70–99)
Potassium: 4.8 mmol/L (ref 3.5–5.1)
Sodium: 121 mmol/L — ABNORMAL LOW (ref 135–145)
Total Bilirubin: 1 mg/dL (ref 0.3–1.2)
Total Protein: 6.2 g/dL — ABNORMAL LOW (ref 6.5–8.1)

## 2020-09-18 MED ORDER — FENTANYL CITRATE (PF) 100 MCG/2ML IJ SOLN
50.0000 ug | Freq: Once | INTRAMUSCULAR | Status: AC
  Administered 2020-09-18: 50 ug via INTRAVENOUS
  Filled 2020-09-18: qty 2

## 2020-09-18 NOTE — ED Notes (Signed)
No answer x1

## 2020-09-18 NOTE — ED Triage Notes (Signed)
Pt arrived via EMS from home reporting left sided cp that is sharp in nature radiating to his jaw and left arm. Per EMS pt went to [facility] and then left due to a long wait time. Per EMS pts cp got worse while at home. Pt states he usually drinks 4 beers a day but stopped drinking this evening due to the cp. Pt was given 324mg  po aspirin per EMS.
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
## 2020-09-19 DIAGNOSIS — E871 Hypo-osmolality and hyponatremia: Secondary | ICD-10-CM | POA: Diagnosis not present

## 2020-09-19 DIAGNOSIS — N1831 Chronic kidney disease, stage 3a: Secondary | ICD-10-CM | POA: Diagnosis not present

## 2020-09-19 DIAGNOSIS — Z794 Long term (current) use of insulin: Secondary | ICD-10-CM

## 2020-09-19 DIAGNOSIS — F1029 Alcohol dependence with unspecified alcohol-induced disorder: Secondary | ICD-10-CM | POA: Diagnosis not present

## 2020-09-19 DIAGNOSIS — I5043 Acute on chronic combined systolic (congestive) and diastolic (congestive) heart failure: Secondary | ICD-10-CM | POA: Diagnosis not present

## 2020-09-19 DIAGNOSIS — E1159 Type 2 diabetes mellitus with other circulatory complications: Secondary | ICD-10-CM

## 2020-09-19 LAB — CBG MONITORING, ED
Glucose-Capillary: 117 mg/dL — ABNORMAL HIGH (ref 70–99)
Glucose-Capillary: 114 mg/dL — ABNORMAL HIGH (ref 70–99)

## 2020-09-19 LAB — ETHANOL: Alcohol, Ethyl (B): 102 mg/dL — ABNORMAL HIGH (ref <10)

## 2020-09-19 LAB — BASIC METABOLIC PANEL
Anion gap: 10 (ref 5–15)
BUN: 14 mg/dL (ref 6–20)
CO2: 22 mmol/L (ref 22–32)
Calcium: 8.3 mg/dL — ABNORMAL LOW (ref 8.9–10.3)
Chloride: 92 mmol/L — ABNORMAL LOW (ref 98–111)
Creatinine, Ser: 1.4 mg/dL — ABNORMAL HIGH (ref 0.61–1.24)
GFR calc Af Amer: >60 mL/min (ref >60)
GFR calc non Af Amer: 54 mL/min — ABNORMAL LOW (ref >60)
Glucose, Bld: 134 mg/dL — ABNORMAL HIGH (ref 70–99)
Potassium: 4.3 mmol/L (ref 3.5–5.1)
Sodium: 124 mmol/L — ABNORMAL LOW (ref 135–145)

## 2020-09-19 LAB — TROPONIN I (HIGH SENSITIVITY): Troponin I (High Sensitivity): 41 ng/L — ABNORMAL HIGH (ref <18)

## 2020-09-19 LAB — RESPIRATORY PANEL BY RT PCR (FLU A&B, COVID)
Influenza A by PCR: NEGATIVE
Influenza B by PCR: NEGATIVE
SARS Coronavirus 2 by RT PCR: NEGATIVE

## 2020-09-19 MED ORDER — SODIUM CHLORIDE 0.9% FLUSH: 3.0000 mL | INTRAVENOUS | Status: DC | PRN

## 2020-09-19 MED ORDER — INSULIN ASPART 100 UNIT/ML ~~LOC~~ SOLN: 0.0000 [IU] | Freq: Three times a day (TID) | SUBCUTANEOUS | Status: DC

## 2020-09-19 MED ORDER — SODIUM CHLORIDE 0.9 % IV SOLN: 250.0000 mL | INTRAVENOUS | Status: DC | PRN

## 2020-09-19 MED ORDER — ONDANSETRON HCL 4 MG/2ML IJ SOLN
4.0000 mg | Freq: Four times a day (QID) | INTRAMUSCULAR | Status: DC | PRN
  Administered 2020-09-19: 4 mg via INTRAVENOUS
  Filled 2020-09-19: qty 2

## 2020-09-19 MED ORDER — SODIUM CHLORIDE 0.9% FLUSH: 3.0000 mL | Freq: Two times a day (BID) | INTRAVENOUS | Status: DC

## 2020-09-19 MED ORDER — HEPARIN SODIUM (PORCINE) 5000 UNIT/ML IJ SOLN
5000.0000 [IU] | Freq: Three times a day (TID) | INTRAMUSCULAR | Status: DC
  Administered 2020-09-19 (×2): 5000 [IU] via SUBCUTANEOUS
  Filled 2020-09-19 (×2): qty 1

## 2020-09-19 MED ORDER — ACETAMINOPHEN 325 MG PO TABS
650.0000 mg | ORAL_TABLET | ORAL | Status: DC | PRN
  Administered 2020-09-19 (×2): 650 mg via ORAL
  Filled 2020-09-19 (×2): qty 2

## 2020-09-19 MED ORDER — INSULIN ASPART 100 UNIT/ML ~~LOC~~ SOLN: 0.0000 [IU] | Freq: Every day | SUBCUTANEOUS | Status: DC

## 2020-09-19 MED ORDER — OXYCODONE-ACETAMINOPHEN 5-325 MG PO TABS
1.0000 | ORAL_TABLET | Freq: Three times a day (TID) | ORAL | Status: DC | PRN
  Administered 2020-09-19: 1 via ORAL
  Filled 2020-09-19: qty 1

## 2020-09-19 NOTE — Progress Notes (Signed)
[facility] PROGRESS NOTE   Brad [name] HYW:[id number] DOB: May 17, 1961 DOA: 09/18/2020  PCP: Charlott Rakes, MD  Brief History/Interval Summary: 59 y.o. male with medical history significant of systolic dysfunction CHF with EF of 30 to 35% back in August of this year, alcoholic cardiomyopathy, alcohol abuse, A. fib, hypertension, chronic recurrent hyponatremia, chronic chest pain, prostate cancer and A. fib who has had more than 12 admissions and ER visits within the last couple of months.  His last admission was September 14 and left on the 20th of this year.  He presents again with a similar episode of left-sided chest pain radiating to his jaw and left arm.  This is the same complaint he has had in various visits for his hyponatremia. He usually drinks about 4 beers a day but stopped drinking today due to the chest pain.  EMS saw him and given aspirin 324 mg and transported him to the ER.  He is slightly somnolent at this point.  Chest pain was reproducible.  Patient sodium was noted to be down to 121.  His last sodium was 126.  His sodium levels usually run in the 120s. He has hyponatremia attributed to alcoholism and his chronic systolic dysfunction CHF.  Patient being admitted for observation and management of this symptoms..  Reason for Visit: Hyponatremia  Consultants: None  Procedures: None  Antibiotics: Anti-infectives (From admission, onward)   None      Subjective/Interval History: Patient complains of pain in his neck and back.  Denies any chest pain currently.  No nausea vomiting.  Noted to have some myoclonic jerks similar to before.     Assessment/Plan:  Acute on chronic hyponatremia Probably related to his alcohol abuse and his congestive heart failure ongoing seems to be euvolemic currently.  He is on diuretics at home which is being held.  Fluid restriction.   Sodium was 121 on admission.  We will recheck it today.  When last checked his urine  osmolality was 180 and urine sodium was less than 10.  Not consistent with SIADH.  History of alcohol abuse and dependency Last drink was on the day of admission.  Monitor for withdrawal symptoms.  Continue CIWA protocol.  Chronic systolic CHF EF is 30 to 48% based on recent echocardiogram.  Continue to monitor closely.  Currently seems to be euvolemic.  Chest pain This is recurrent.  He has had numerous work-up for same.  His troponin noted to be mildly abnormal.  He does have a history of chronic troponin elevations.  EKG shows A. fib but no ischemic changes.  Continue to monitor for now.  No need to consult cardiology.  Diabetes mellitus type 2 Noted to be on Farxiga at home.  Continue to monitor CBGs.  Chronic kidney disease stage IIIa. Renal function seems to be stable.  Continue to monitor urine output.  Essential hypertension Monitor blood pressures.  His antihypertensives are on hold currently.  Chronic neck and back pain Underwent neck surgery recently.  Followed by Dr. Reatha Armour.  Noted to be on Percocet which is being continued.  Persistent atrial fibrillation On amiodarone and digoxin.  He was taken off of anticoagulation recently due to recurrent falls.  Supposed to be on 81 mg of aspirin.   DVT Prophylaxis: Subcutaneous heparin Code Status: Full code Family Communication: Discussed with the patient Disposition Plan: Hopefully return home when improved  Status is: Observation  The patient will require care spanning > 2 midnights and should be moved to inpatient  because: Ongoing diagnostic testing needed not appropriate for outpatient work up and Inpatient level of care appropriate due to severity of illness  Dispo: The patient is from: Home              Anticipated d/c is to: Home              Anticipated d/c date is: 1 day              Patient currently is not medically stable to d/c.      Medications:  Scheduled: . heparin  5,000 Units Subcutaneous Q8H  .  insulin aspart  0-15 Units Subcutaneous TID WC  . insulin aspart  0-5 Units Subcutaneous QHS  . sodium chloride flush  3 mL Intravenous Q12H   Continuous: . sodium chloride     QMG:QQPYPP chloride, acetaminophen, ondansetron (ZOFRAN) IV, oxyCODONE-acetaminophen, sodium chloride flush   Objective:  Vital Signs  Vitals:   09/18/20 2212 09/19/20 0852  BP: (!) 150/85 130/63  Pulse: 70 67  Resp: 18 20  Temp: 98.7 F (37.1 C) 98.5 F (36.9 C)  TempSrc: Oral Oral  SpO2: 99% 99%   No intake or output data in the 24 hours ending 09/19/20 1239 There were no vitals filed for this visit.  General appearance: Awake alert.  In no distress Resp: Clear to auscultation bilaterally.  Normal effort Cardio: S1-S2 is normal regular.  No S3-S4.  No rubs murmurs or bruit GI: Abdomen is soft.  Nontender nondistended.  Bowel sounds are present normal.  No masses organomegaly Extremities: No edema.  Full range of motion of lower extremities. Neurologic: Alert and oriented x3.  No focal neurological deficits.    Lab Results:  Data Reviewed: I have personally reviewed following labs and imaging studies  CBC: Recent Labs  Lab 09/18/20 2220  WBC 4.0  NEUTROABS 2.6  HGB 11.4*  HCT 34.3*  MCV 84.9  PLT 509    Basic Metabolic Panel: Recent Labs  Lab 09/18/20 2220  NA 121*  K 4.8  CL 90*  CO2 20*  GLUCOSE 111*  BUN 13  CREATININE 1.33*  CALCIUM 8.1*    GFR: Estimated Creatinine Clearance: 62.9 mL/min (A) (by C-G formula based on SCr of 1.33 mg/dL (H)).  Liver Function Tests: Recent Labs  Lab 09/18/20 2220  AST 46*  ALT 27  ALKPHOS 51  BILITOT 1.0  PROT 6.2*  ALBUMIN 2.8*    Recent Labs  Lab 09/18/20 2220  LIPASE 43    CBG: Recent Labs  Lab 09/19/20 0756 09/19/20 1217  GLUCAP 117* 114*     Recent Results (from the past 240 hour(s))  Respiratory Panel by RT PCR (Flu A&B, Covid) - Nasopharyngeal Swab     Status: None   Collection Time: 09/19/20  1:06 AM    Specimen: Nasopharyngeal Swab  Result Value Ref Range Status   SARS Coronavirus 2 by RT PCR NEGATIVE NEGATIVE Final    Comment: (NOTE) SARS-CoV-2 target nucleic acids are NOT DETECTED.  The SARS-CoV-2 RNA is generally detectable in upper respiratoy specimens during the acute phase of infection. The lowest concentration of SARS-CoV-2 viral copies this assay can detect is 131 copies/mL. A negative result does not preclude SARS-Cov-2 infection and should not be used as the sole basis for treatment or other patient management decisions. A negative result may occur with  improper specimen collection/handling, submission of specimen other than nasopharyngeal swab, presence of viral mutation(s) within the areas targeted by this assay, and inadequate  number of viral copies (<131 copies/mL). A negative result must be combined with clinical observations, patient history, and epidemiological information. The expected result is Negative.  Fact Sheet for Patients:  PinkCheek.be  Fact Sheet for Healthcare Providers:  GravelBags.it  This test is no t yet approved or cleared by the [place] FDA and  has been authorized for detection and/or diagnosis of SARS-CoV-2 by FDA under an Emergency Use Authorization (EUA). This EUA will remain  in effect (meaning this test can be used) for the duration of the COVID-19 declaration under Section 564(b)(1) of the Act, 21 U.S.C. section 360bbb-3(b)(1), unless the authorization is terminated or revoked sooner.     Influenza A by PCR NEGATIVE NEGATIVE Final   Influenza B by PCR NEGATIVE NEGATIVE Final    Comment: (NOTE) The Xpert Xpress SARS-CoV-2/FLU/RSV assay is intended as an aid in  the diagnosis of influenza from Nasopharyngeal swab specimens and  should not be used as a sole basis for treatment. Nasal washings and  aspirates are unacceptable for Xpert Xpress SARS-CoV-2/FLU/RSV   testing.  Fact Sheet for Patients: PinkCheek.be  Fact Sheet for Healthcare Providers: GravelBags.it  This test is not yet approved or cleared by the [place] FDA and  has been authorized for detection and/or diagnosis of SARS-CoV-2 by  FDA under an Emergency Use Authorization (EUA). This EUA will remain  in effect (meaning this test can be used) for the duration of the  Covid-19 declaration under Section 564(b)(1) of the Act, 21  U.S.C. section 360bbb-3(b)(1), unless the authorization is  terminated or revoked. Performed at [facility], [place] [address], [place], [place] [address]       Radiology Studies: DG Chest 2 View  Result Date: 09/18/2020 CLINICAL DATA:  Chest pain. EXAM: CHEST - 2 VIEW COMPARISON:  None. FINDINGS: There is no evidence of acute infiltrate, pleural effusion or pneumothorax. The cardiac silhouette is markedly enlarged. The visualized skeletal structures are unremarkable. IMPRESSION: Cardiomegaly without evidence of acute or active cardiopulmonary disease. Electronically Signed   By: Virgina Norfolk M.D.   On: 09/18/2020 23:04       LOS: 0 days   [facility] Pager on [email or web address]  09/19/2020, 12:39 PM

## 2020-09-19 NOTE — ED Notes (Signed)
Provider stated that pt may leave AMA at this time.
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
## 2020-09-19 NOTE — ED Notes (Signed)
Pt tray was ordered. Pt is resting comfortably.

## 2020-09-19 NOTE — ED Notes (Signed)
Pt states he would like to leave AMA. This RN contacted the Provider regarding this pts request. Awaiting further orders.

## 2020-09-19 NOTE — ED Notes (Signed)
Pt refused repeat VS or repeat physical assessment

## 2020-09-19 NOTE — Discharge Summary (Signed)
[facility]  Physician Discharge Summary   Patient ID: Brad [name] MRN: [id number] DOB/AGE: Jun 04, 1961 59 y.o.  Admit date: 09/18/2020 Discharge date: 09/20/2020  PCP: Charlott Rakes, MD   PATIENT LEFT AGAINST MEDICAL ADVICE   INITIAL HISTORY: 59 y.o.malewith medical history significant ofsystolic dysfunction CHF with EF of 30 to 35% back in August of this year, alcoholic cardiomyopathy, alcohol abuse, A. fib, hypertension, chronic recurrent hyponatremia, chronic chest pain, prostate cancer and A. fib who has had more than 12 admissions and ER visits within the last couple of months. His last admission was September 14 and left on the 20th of this year. He presents again with a similar episode of left-sided chest pain radiating to his jaw and left arm. This is the same complaint he has had in various visits for his hyponatremia. He usually drinks about 4 beers a day but stopped drinking today due to the chest pain. EMS saw him and given aspirin 324 mg and transported him to the ER. He is slightly somnolent at this point. Chest pain was reproducible. Patient sodium was noted to be down to 121. His last sodium was 126.  His sodium levels usually run in the 120s. He has hyponatremia attributed to alcoholism and his chronic systolic dysfunction CHF. Patient being admitted for observation and management of this symptoms..   HOSPITAL COURSE:   Patient was hospitalized and was being managed for conditions as outlined below.  Please also review progress note from earlier today.  However patient decided that he wants to leave the hospital.  He did not give any specific reason for the same.  Nurse was asked to communicate with the patient that if he did [place] he would be putting his health in jeopardy.  This was communicated to him.  He is still decided to leave AMA.      Acute on chronic hyponatremia Probably related to his alcohol abuse and his congestive  heart failure ongoing seems to be euvolemic currently.  He is on diuretics at home which is being held.  Fluid restriction.   Sodium was 121 on admission.    Noted to have improved to 124 when checked today. When last checked his urine osmolality was 180 and urine sodium was less than 10.  Not consistent with SIADH.  History of alcohol abuse and dependency Last drink was on the day of admission.  He was placed on CIWA protocol.  Chronic systolic CHF EF is 30 to 17% based on recent echocardiogram.    Chest pain This is recurrent.  He has had numerous work-up for same.  His troponin noted to be mildly abnormal.  He does have a history of chronic troponin elevations.  EKG shows A. fib but no ischemic changes.   Diabetes mellitus type 2 Noted to be on Farxiga at home.    Chronic kidney disease stage IIIa. Renal function seems to be stable.  Essential hypertension   Chronic neck and back pain Underwent neck surgery recently.  Followed by Dr. Reatha Armour.  Noted to be on Percocet which WAS being continued.  Persistent atrial fibrillation On amiodarone and digoxin.  He was taken off of anticoagulation recently due to recurrent falls.  Supposed to be on 81 mg of aspirin.   PATIENT LEFT AGAINST MEDICAL ADVICE   PERTINENT LABS:  The results of significant diagnostics from this hospitalization (including imaging, microbiology, ancillary and laboratory) are listed below for reference.    Microbiology: Recent Results (from the past 240 hour(s))  Respiratory Panel by RT PCR (Flu A&B, Covid) - Nasopharyngeal Swab     Status: None   Collection Time: 09/19/20  1:06 AM   Specimen: Nasopharyngeal Swab  Result Value Ref Range Status   SARS Coronavirus 2 by RT PCR NEGATIVE NEGATIVE Final    Comment: (NOTE) SARS-CoV-2 target nucleic acids are NOT DETECTED.  The SARS-CoV-2 RNA is generally detectable in upper respiratoy specimens during the acute phase of infection. The  lowest concentration of SARS-CoV-2 viral copies this assay can detect is 131 copies/mL. A negative result does not preclude SARS-Cov-2 infection and should not be used as the sole basis for treatment or other patient management decisions. A negative result may occur with  improper specimen collection/handling, submission of specimen other than nasopharyngeal swab, presence of viral mutation(s) within the areas targeted by this assay, and inadequate number of viral copies (<131 copies/mL). A negative result must be combined with clinical observations, patient history, and epidemiological information. The expected result is Negative.  Fact Sheet for Patients:  PinkCheek.be  Fact Sheet for Healthcare Providers:  GravelBags.it  This test is no t yet approved or cleared by the [place] FDA and  has been authorized for detection and/or diagnosis of SARS-CoV-2 by FDA under an Emergency Use Authorization (EUA). This EUA will remain  in effect (meaning this test can be used) for the duration of the COVID-19 declaration under Section 564(b)(1) of the Act, 21 U.S.C. section 360bbb-3(b)(1), unless the authorization is terminated or revoked sooner.     Influenza A by PCR NEGATIVE NEGATIVE Final   Influenza B by PCR NEGATIVE NEGATIVE Final    Comment: (NOTE) The Xpert Xpress SARS-CoV-2/FLU/RSV assay is intended as an aid in  the diagnosis of influenza from Nasopharyngeal swab specimens and  should not be used as a sole basis for treatment. Nasal washings and  aspirates are unacceptable for Xpert Xpress SARS-CoV-2/FLU/RSV  testing.  Fact Sheet for Patients: PinkCheek.be  Fact Sheet for Healthcare Providers: GravelBags.it  This test is not yet approved or cleared by the [place] FDA and  has been authorized for detection and/or diagnosis of SARS-CoV-2 by  FDA under  an Emergency Use Authorization (EUA). This EUA will remain  in effect (meaning this test can be used) for the duration of the  Covid-19 declaration under Section 564(b)(1) of the Act, 21  U.S.C. section 360bbb-3(b)(1), unless the authorization is  terminated or revoked. Performed at [facility], [place] [address], [place], [place] [address]      Labs:    Basic Metabolic Panel: Recent Labs  Lab 09/18/20 2220 09/19/20 1440  NA 121* 124*  K 4.8 4.3  CL 90* 92*  CO2 20* 22  GLUCOSE 111* 134*  BUN 13 14  CREATININE 1.33* 1.40*  CALCIUM 8.1* 8.3*   Liver Function Tests: Recent Labs  Lab 09/18/20 2220  AST 46*  ALT 27  ALKPHOS 51  BILITOT 1.0  PROT 6.2*  ALBUMIN 2.8*   Recent Labs  Lab 09/18/20 2220  LIPASE 43   CBC: Recent Labs  Lab 09/18/20 2220  WBC 4.0  NEUTROABS 2.6  HGB 11.4*  HCT 34.3*  MCV 84.9  PLT 347   BNP: BNP (last 3 results) Recent Labs    08/16/20 1122 08/20/20 0852 09/04/20 0649  BNP 1,319.4* 782.2* 494.9*    CBG: Recent Labs  Lab 09/19/20 0756 09/19/20 1217  GLUCAP 117* 114*     IMAGING STUDIES DG Chest 2 View  Result Date: 09/18/2020  CLINICAL DATA:  Chest pain. EXAM: CHEST - 2 VIEW COMPARISON:  None. FINDINGS: There is no evidence of acute infiltrate, pleural effusion or pneumothorax. The cardiac silhouette is markedly enlarged. The visualized skeletal structures are unremarkable. IMPRESSION: Cardiomegaly without evidence of acute or active cardiopulmonary disease. Electronically Signed   By: Virgina Norfolk M.D.   On: 09/18/2020 23:04   DG Chest 2 View  Result Date: 09/04/2020 CLINICAL DATA:  Shortness of breath EXAM: CHEST - 2 VIEW COMPARISON:  08/22/2020 FINDINGS: Cardiomegaly. There is no edema, consolidation, effusion, or pneumothorax. No acute osseous finding. IMPRESSION: No acute finding. Chronic cardiomegaly. Electronically Signed   By: Monte Fantasia M.D.   On: 09/04/2020 07:12   DG CHEST PORT 1  VIEW  Result Date: 08/22/2020 CLINICAL DATA:  Increasing shortness of breath EXAM: PORTABLE CHEST 1 VIEW COMPARISON:  Two days ago FINDINGS: Cardiomegaly and symmetric vascular prominence. No Ker[place] lines, effusion, or air leak. IMPRESSION: Cardiomegaly and venous congestion without progression from 2 days ago. Electronically Signed   By: Monte Fantasia M.D.   On: 08/22/2020 05:15   DG Cervical Spine 2-3 View Clearing  Result Date: 09/04/2020 CLINICAL DATA:  Fall at home complaining of neck pain and shortness of breath. EXAM: LIMITED CERVICAL SPINE FOR TRAUMA CLEARING - 2-3 VIEW COMPARISON:  July 30, 2020 FINDINGS: Decreased prevertebral soft tissue swelling when compared to postoperative radiograph from August 20, 2020. Cervical spine with limited assessment of the cervicothoracic junction on today's exam only the superior most aspect of the T1 vertebral body is imaged. No sign of static subluxation.  No visible fracture. Limited assessment also of the upper cervical spine on the AP view, no open-mouth view is provided. IMPRESSION: 1. Decreased prevertebral soft tissue swelling when compared to postoperative radiograph from August 20, 2020. 2. No signs of fracture or static subluxation, limited assessment of the craniocervical junction and cervicothoracic junction as described. Electronically Signed   By: Zetta Bills M.D.   On: 09/04/2020 10:38   DG Finger Thumb Right  Result Date: 09/04/2020 CLINICAL DATA:  [place] several days ago with pain and swelling. EXAM: RIGHT THUMB 2+V COMPARISON:  08/16/2020 FINDINGS: Redemonstration of a comminuted fracture of the distal phalanx of the thumb and of a comminuted fracture at the base of the proximal phalanx of the thumb with intra-articular extension. No change in position or alignment. IMPRESSION: Comminuted fractures of the distal phalanx and proximal phalanx of the thumb. No change in position or alignment. Electronically Signed   By: Nelson Chimes M.D.   On:  09/04/2020 12:37    [facility] Pager on [email or web address]  09/20/2020, 12:15 PM

## 2020-09-20 ENCOUNTER — Telehealth (HOSPITAL_COMMUNITY): Payer: Self-pay | Admitting: Licensed Clinical Social Worker

## 2020-09-20 ENCOUNTER — Telehealth: Payer: Self-pay

## 2020-09-20 NOTE — Telephone Encounter (Signed)
Pt returned CSW call.  Pt reports he is feeling ok- left yesterday because he was informed by staff that it would be a long time before he could get transferred to a bed.  Pt main concern at this time is housing.  States his long term roommate, [name], moved out without telling him because he owed a lot of rent.  Now pt reporting the landlord is stating that if the pt does not pay her $50 by the end of today he will be made to leave as well- pt reports he does not have the funds at this time.  CSW explained that we could help him through the patient care fund but that I would need to give to landlord directly- pt does not have her contact information and states she just shows up.  Encouraged pt to provide her with my information when she shows up next or call me while she is there so we can discuss how to assist pt at this time.  CSW also called [place] [employer] commissioning to update and see if there were any updates on available housing- left VM requesting return call  Will continue to follow and assist as needed  [name], [facility] Desk#: [phone] Cell#: [phone]

## 2020-09-20 NOTE — Telephone Encounter (Signed)
CSW called pt this morning to check in after he left AMA from hospital yesterday afternoon.  No answer- left VM requesting return call  Brad [name], [place] [facility] Desk#: [phone] Cell#: [phone]

## 2020-09-20 NOTE — Telephone Encounter (Signed)
Call placed to patient # [id number] to check on him.  Message left with call back requested to this CM # [phone].

## 2020-09-21 ENCOUNTER — Telehealth (HOSPITAL_COMMUNITY): Payer: Self-pay | Admitting: Licensed Clinical Social Worker

## 2020-09-21 NOTE — Telephone Encounter (Signed)
CSW informed by [employer] that apt has become available for the patient- tour set up for Monday at 10am.  Pt believes he has transport to this apt through his friend [name] but CSW to confirm with pt Monday morning.  Pt also concerned about getting evicted states he is $20 short on his rent and that landlord will evict him if doesn't pay this evening.  Landlord will be back at 6:30pm so CSW will be gone- was able to provide pt with money through patient care fund.  Will continue to follow and assist as needed  [name], [facility] Desk#: [phone] Cell#: [phone]

## 2020-09-24 ENCOUNTER — Telehealth (HOSPITAL_COMMUNITY): Payer: Self-pay

## 2020-09-24 ENCOUNTER — Ambulatory Visit: Payer: Medicaid Other | Admitting: Family Medicine

## 2020-09-24 ENCOUNTER — Telehealth (HOSPITAL_COMMUNITY): Payer: Self-pay | Admitting: Licensed Clinical Social Worker

## 2020-09-24 NOTE — Telephone Encounter (Signed)
CSW and [employer] called pt this morning to remind pt of [place] at potential available apt this morning at 10am.  Pt stated he was aware of appt and that his old roommate [name] would be taking him to the appt.  CSW then received a call back from the pt a few minutes before his appt and stated he decided he was not going to go.  States that he does not like the complex that it is at and thinks its in an unsafe area despite that being the same complex he toured an apartment at a few weeks ago and loved.  Pt requesting CSW call and make an another appt for him this afternoon.   CSW discussed with pt concerns with his current housing including that it was changing ownership, his roommate would moving out so he wouldn't be able to afford rent each month as well as his other needs, and that it is infested with bed bugs.  Reminded pt that he has exhausted other housing options with CHW case manager and that he will not have other affordable options available to him.  Pt still maintains that he doesn't want to go to the apartment.  CSW informed pt he needs to call his housing authority worker to discuss next steps but warned patient that him not going to the appt might affect his eligibility for future options through this program- pt acknowledges this but doesn't care.  CSW sent email to housing authority case worker to inform pt should be calling her to discuss and updated Community paramedic and CHW RN case manager regarding pt decision.  Will continue to follow and assist as needed  [name], [facility] Desk#: [phone] Cell#: [phone]

## 2020-09-24 NOTE — Telephone Encounter (Signed)
Spoke to Brad [name] this morning in reference to his appointment with Housing Authority today at 10:00 at [facility] agreed he would be there. I also advised him we would be rescheduling his appointment at [facility].

## 2020-09-26 ENCOUNTER — Telehealth (HOSPITAL_COMMUNITY): Payer: Self-pay

## 2020-09-26 ENCOUNTER — Other Ambulatory Visit (HOSPITAL_COMMUNITY): Payer: Self-pay

## 2020-09-26 NOTE — Telephone Encounter (Signed)
Brad [name] called and stated he was not available for home visit as planned today. I will see him tomorrow in the morning. Call complete.

## 2020-09-26 NOTE — Progress Notes (Signed)
Paramedicine Encounter    Patient ID: Brad [name], male    DOB: 1961/05/25, 59 y.o.   MRN: [id number]   Patient Care Team: Charlott Rakes, MD as PCP - General (Family Medicine) Debara Pickett Nadean Corwin, MD as PCP - Cardiology (Cardiology) Charlott Rakes, MD (Family Medicine) Charlott Rakes, MD (Family Medicine)  Patient Active Problem List   Diagnosis Date Noted  . Hyponatremia with normal extracellular fluid volume 09/04/2020  . CKD (chronic kidney disease), stage II 08/23/2020  . Thumb fracture 08/23/2020  . Avulsion fracture of phalanx of right thumb 08/16/2020  . CHF exacerbation ([place]) 08/01/2020  . Cord compression ([place])   . Acute exacerbation of CHF (congestive heart failure) ([place]) 05/29/2020  . Chronic combined systolic and diastolic CHF (congestive heart failure) ([place]) 04/19/2020  . Alcohol abuse with intoxication ([place]) 03/23/2020  . Hyponatremia 03/23/2020  . Fall 03/23/2020  . Normocytic anemia 03/23/2020  . Leukopenia 03/23/2020  . HTN (hypertension) 03/23/2020  . HLD (hyperlipidemia) 03/23/2020  . AKI (acute kidney injury) ([place]) 03/23/2020  . CHF (congestive heart failure) ([place]) 03/23/2020  . Alcohol abuse with intoxication ([place]) 03/05/2020  . Chronic systolic CHF (congestive heart failure) ([place]) 10/31/2019  . Acute systolic CHF (congestive heart failure) ([place]) 08/02/2019  . Diarrhea 07/29/2019  . Gastroenteritis 07/22/2019  . Hypomagnesemia 06/19/2019  . Chest pain 06/07/2019  . Elevated troponin 05/23/2019  . Tobacco abuse 05/23/2019  . Alcohol dependence syndrome ([place]) 02/21/2019  . Frequent falls 01/17/2019  . Severe mitral regurgitation   . Fall 11/01/2018  . Hyponatremia 10/31/2018  . Chronic atrial fibrillation   . Chronic hyponatremia 08/16/2018  . NSVT (nonsustained ventricular tachycardia) ([place])   . Concussion with loss of consciousness   . Scalp laceration   . Trauma   . Permanent atrial fibrillation   . Hypertensive heart disease   .  Shortness of breath   . Pyogenic inflammation of bone ([place])   . Cirrhosis ([place]) 03/23/2018  . Right ankle pain 03/23/2018  . Syncope 08/07/2017  . Acute on chronic combined systolic and diastolic CHF (congestive heart failure) ([place]) 07/09/2017  . Atrial flutter ([place]) 07/09/2017  . Pre-syncope 07/08/2017  . Neuropathy 05/08/2017  . Substance induced mood disorder ([place]) 10/06/2016  . CVA (cerebral vascular accident) ([place]) 09/18/2016  . Left sided numbness   . Homelessness 08/21/2016  . S/P ORIF (open reduction internal fixation) fracture 08/01/2016  . CAD (coronary artery disease), native coronary artery 07/30/2016  . Surgery, elective   . Insomnia 07/22/2016  . NSTEMI (non-ST elevated myocardial infarction) ([place])   . Anemia 07/05/2016  . Thrombocytopenia ([place]) 07/05/2016  . Cocaine abuse ([place]) 07/02/2016  . Chest pain on breathing 07/01/2016  . Essential hypertension 07/01/2016  . DM type 2 (diabetes mellitus, type 2) ([place]) 07/01/2016  . Hypokalemia 07/01/2016  . CKD (chronic kidney disease) stage 3, GFR 30-59 ml/min (HCC) 07/01/2016  . Painful diabetic neuropathy ([place]) 07/01/2016  . Acute hyponatremia 05/27/2016  . Polysubstance abuse ([place]) 05/27/2016  . Chronic hepatitis C with cirrhosis ([place]) 05/27/2016  . Depression 04/21/2012  . GERD (gastroesophageal reflux disease) 02/16/2012  . History of drug abuse ([place])   . Heroin addiction ([place]) 01/29/2012    Current Outpatient Medications:  .  amiodarone (PACERONE) 200 MG tablet, Take 1 tablet (200 mg total) by mouth daily., Disp: 30 tablet, Rfl: 6 .  atorvastatin (LIPITOR) 20 MG tablet, Take 1 tablet (20 mg total) by mouth daily at 6 PM., Disp: 30 tablet, Rfl: 6 .  dapagliflozin propanediol (FARXIGA)  5 MG TABS tablet, Take 1 tablet (5 mg total) by mouth daily before breakfast., Disp: 30 tablet, Rfl: 6 .  furosemide (LASIX) 40 MG tablet, Take 1 tablet (40 mg total) by mouth 2 (two) times daily., Disp: 60 tablet, Rfl: 2 .   gabapentin (NEURONTIN) 300 MG capsule, Take 2 capsules (600 mg total) by mouth 2 (two) times daily., Disp: 60 capsule, Rfl: 3 .  isosorbide-hydrALAZINE (BIDIL) 20-37.5 MG tablet, Take 1 tablet by mouth 3 (three) times daily., Disp: 90 tablet, Rfl: 6 .  potassium chloride SA (KLOR-CON) 20 MEQ tablet, Take 1 tablet (20 mEq total) by mouth daily., Disp: 30 tablet, Rfl: 6 Allergies  Allergen Reactions  . Angiotensin Receptor Blockers Anaphylaxis and Other (See Comments)    (Angioedema also with Lisinopril, therefore ARB's are contraindicated)  . Lisinopril Anaphylaxis and Swelling    Throat swelling  . Pamelor [Nortriptyline Hcl] Anaphylaxis and Swelling    Throat swells     Social History   Socioeconomic History  . Marital status: Single    Spouse name: Not on file  . Number of children: 3  . Years of education: 2y college  . Highest education level: Not on file  Occupational History  . 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 Smoking status: Current Every Day Smoker    Packs/day: 1.00    Years: 35.00    Pack years: 35.00    Types: Cigarettes  . Smokeless tobacco: Never Used  Vaping Use  . Vaping Use: Never used  Substance and Sexual Activity  . Alcohol use: Yes    Alcohol/week: 25.0 standard drinks    Types: 25 Cans of beer per week    Comment: "depends on the day"; reports not drinking every day, "I stopped about 2-3 wekes ago" - but drank yesterday, 2 x 24oz cans  . Drug use: Not Currently    Types: IV, Cocaine, Heroin    Comment: 08/17/2018 "no heroin since 2013; denies current use of cocaine, "it's been a while, months and months"  . Sexual activity: Not Currently  Other Topics Concern  . Not on file  Social History Narrative   ** Merged History Encounter **       ** Merged History Encounter **       Incarcerated from 2006-2010, then 10/2011-12/2011.  Has been trying to get sober (no heroin, alcohol since  10/2011).    Social Determinants of Health   Financial Resource Strain:   . Difficulty of Paying Living Expenses: Not on file  Food Insecurity: No Food Insecurity  . Worried About Charity fundraiser in the Last Year: Never true  . Ran Out of Food in the Last Year: Never true  Transportation Needs:   . Lack of Transportation (Medical): Not on file  . Lack of Transportation (Non-Medical): Not on file  Physical Activity:   . Days of Exercise per Week: Not on file  . Minutes of Exercise per Session: Not on file  Stress:   . Feeling of Stress : Not on file  Social Connections:   . Frequency of Communication with Friends and Family: Not on file  . Frequency of Social Gatherings with Friends and Family: Not on file  . Attends Religious Services: Not on file  . Active Member of Clubs or Organizations: Not on file  . Attends Archivist Meetings: Not on file  . Marital Status: Not on  file  Intimate Partner Violence:   . Fear of Current or Ex-Partner: Not on file  . Emotionally Abused: Not on file  . Physically Abused: Not on file  . Sexually Abused: Not on file    Physical Exam Vitals reviewed.  Constitutional:      Appearance: He is normal weight.  HENT:     Head: Normocephalic.     Nose: Nose normal.     Mouth/Throat:     Mouth: Mucous membranes are moist.     Pharynx: Oropharynx is clear.  Eyes:     Pupils: Pupils are equal, round, and reactive to light.  Cardiovascular:     Rate and Rhythm: Normal rate and regular rhythm.     Pulses: Normal pulses.     Heart sounds: Normal heart sounds.  Pulmonary:     Effort: Pulmonary effort is normal.     Breath sounds: Normal breath sounds.  Abdominal:     General: Abdomen is flat.     Palpations: Abdomen is soft.  Musculoskeletal:        General: Normal range of motion.     Cervical back: Normal range of motion.  Skin:    General: Skin is warm and dry.     Capillary Refill: Capillary refill takes less than 2  seconds.  Neurological:     Mental Status: He is alert. Mental status is at baseline.  Psychiatric:        Mood and Affect: Mood normal.     Arrived for home visit for Jayron who was alert seated in a chair outside of his apartment with a 40oz beer by his side. Arnold reported he has been having some pain in his hands and feet over the last several days and has been compliant with his medications. In his pill box several missed doses noted. Vitals obtained. Medications reviewed and confirmed. Pill box filled accordingly. Luisenrique advised to take medications as prescribed and to make sure he is taking them according to his pill box. Ramsey verbalized understanding. Avi had no other complaints on assessment. No swelling noted. Weight down upon assessment. Gwyndolyn Saxon reminded of upcoming appointment with Dr. Margarita Rana on Monday, he agreed to be there. [name] also agreed to take him.   I asked Rayshon the reasoning for missing his scheduled housing appointment for Monday and he reports it's because he needs a two bedroom apartment for him and [name]. I reported to Ashaad he needs to be worried about himself and his current situation and he stated he wants [name] around to make sure he doesn't fall or hurt himself. I advised Markese from this point forward he will have to work on his housing situation himself and I would assist as best I could. He agreed and understood same.    Home visit complete.    RefillsWilder Glade       Future Appointments  Date Time Provider Ida  10/01/2020 10:50 AM Charlott Rakes, MD [facility] None     ACTION: Home visit completed Next visit planned for Monday

## 2020-10-01 ENCOUNTER — Ambulatory Visit: Payer: Medicaid Other | Admitting: Family Medicine

## 2020-10-01 ENCOUNTER — Other Ambulatory Visit (HOSPITAL_COMMUNITY): Payer: Self-pay

## 2020-10-01 ENCOUNTER — Ambulatory Visit: Payer: Medicaid Other | Attending: Family Medicine | Admitting: Family Medicine

## 2020-10-01 ENCOUNTER — Other Ambulatory Visit: Payer: Self-pay

## 2020-10-01 ENCOUNTER — Encounter: Payer: Self-pay | Admitting: Family Medicine

## 2020-10-01 ENCOUNTER — Telehealth: Payer: Self-pay

## 2020-10-01 VITALS — BP 146/76 | HR 85 | Ht 71.0 in | Wt 172.8 lb

## 2020-10-01 DIAGNOSIS — I428 Other cardiomyopathies: Secondary | ICD-10-CM | POA: Diagnosis not present

## 2020-10-01 DIAGNOSIS — E1122 Type 2 diabetes mellitus with diabetic chronic kidney disease: Secondary | ICD-10-CM | POA: Diagnosis not present

## 2020-10-01 DIAGNOSIS — F1029 Alcohol dependence with unspecified alcohol-induced disorder: Secondary | ICD-10-CM | POA: Diagnosis not present

## 2020-10-01 DIAGNOSIS — Z1211 Encounter for screening for malignant neoplasm of colon: Secondary | ICD-10-CM

## 2020-10-01 DIAGNOSIS — F329 Major depressive disorder, single episode, unspecified: Secondary | ICD-10-CM | POA: Insufficient documentation

## 2020-10-01 DIAGNOSIS — Z9119 Patient's noncompliance with other medical treatment and regimen: Secondary | ICD-10-CM | POA: Insufficient documentation

## 2020-10-01 DIAGNOSIS — E1140 Type 2 diabetes mellitus with diabetic neuropathy, unspecified: Secondary | ICD-10-CM | POA: Insufficient documentation

## 2020-10-01 DIAGNOSIS — I5042 Chronic combined systolic (congestive) and diastolic (congestive) heart failure: Secondary | ICD-10-CM | POA: Diagnosis not present

## 2020-10-01 DIAGNOSIS — G253 Myoclonus: Secondary | ICD-10-CM | POA: Insufficient documentation

## 2020-10-01 DIAGNOSIS — F1190 Opioid use, unspecified, uncomplicated: Secondary | ICD-10-CM | POA: Diagnosis not present

## 2020-10-01 DIAGNOSIS — E7800 Pure hypercholesterolemia, unspecified: Secondary | ICD-10-CM | POA: Diagnosis not present

## 2020-10-01 DIAGNOSIS — N183 Chronic kidney disease, stage 3 unspecified: Secondary | ICD-10-CM | POA: Diagnosis not present

## 2020-10-01 DIAGNOSIS — Z794 Long term (current) use of insulin: Secondary | ICD-10-CM | POA: Insufficient documentation

## 2020-10-01 DIAGNOSIS — F1410 Cocaine abuse, uncomplicated: Secondary | ICD-10-CM | POA: Diagnosis not present

## 2020-10-01 DIAGNOSIS — E1159 Type 2 diabetes mellitus with other circulatory complications: Secondary | ICD-10-CM | POA: Insufficient documentation

## 2020-10-01 DIAGNOSIS — I4891 Unspecified atrial fibrillation: Secondary | ICD-10-CM | POA: Diagnosis not present

## 2020-10-01 DIAGNOSIS — M5412 Radiculopathy, cervical region: Secondary | ICD-10-CM

## 2020-10-01 DIAGNOSIS — K7460 Unspecified cirrhosis of liver: Secondary | ICD-10-CM | POA: Diagnosis not present

## 2020-10-01 DIAGNOSIS — Z79899 Other long term (current) drug therapy: Secondary | ICD-10-CM | POA: Insufficient documentation

## 2020-10-01 DIAGNOSIS — E871 Hypo-osmolality and hyponatremia: Secondary | ICD-10-CM | POA: Insufficient documentation

## 2020-10-01 DIAGNOSIS — I130 Hypertensive heart and chronic kidney disease with heart failure and stage 1 through stage 4 chronic kidney disease, or unspecified chronic kidney disease: Secondary | ICD-10-CM | POA: Insufficient documentation

## 2020-10-01 DIAGNOSIS — K219 Gastro-esophageal reflux disease without esophagitis: Secondary | ICD-10-CM | POA: Diagnosis not present

## 2020-10-01 DIAGNOSIS — Z981 Arthrodesis status: Secondary | ICD-10-CM | POA: Diagnosis not present

## 2020-10-01 DIAGNOSIS — F17210 Nicotine dependence, cigarettes, uncomplicated: Secondary | ICD-10-CM | POA: Insufficient documentation

## 2020-10-01 DIAGNOSIS — Z720 Tobacco use: Secondary | ICD-10-CM

## 2020-10-01 LAB — POCT GLYCOSYLATED HEMOGLOBIN (HGB A1C): HbA1c, POC (controlled diabetic range): 6.4 % (ref 0.0–7.0)

## 2020-10-01 LAB — GLUCOSE, POCT (MANUAL RESULT ENTRY): POC Glucose: 161 mg/dl — AB (ref 70–99)

## 2020-10-01 MED ORDER — ACCU-CHEK GUIDE VI STRP: ORAL_STRIP | 12 refills | Status: DC

## 2020-10-01 MED ORDER — DULOXETINE HCL 60 MG PO CPEP: 60.0000 mg | ORAL_CAPSULE | Freq: Every day | ORAL | 3 refills | Status: DC

## 2020-10-01 MED ORDER — GABAPENTIN 300 MG PO CAPS: ORAL_CAPSULE | ORAL | 3 refills | Status: DC

## 2020-10-01 MED ORDER — ACCU-CHEK GUIDE ME W/DEVICE KIT: PACK | 0 refills | Status: DC

## 2020-10-01 NOTE — Patient Instructions (Signed)
Cervical Radiculopathy  Cervical radiculopathy means that a nerve in the neck (a cervical nerve) is pinched or bruised. This can happen because of an injury to the cervical spine (vertebrae) in the neck, or as a normal part of getting older. This can cause pain or loss of feeling (numbness) that runs from your neck all the way down to your arm and fingers. Often, this condition gets better with rest. Treatment may be needed if the condition does not get better. What are the causes?  A neck injury.  A bulging disk in your spine.  Muscle movements that you cannot control (muscle spasms).  Tight muscles in your neck due to overuse.  Arthritis.  Breakdown in the bones and joints of the spine (spondylosis) due to getting older.  Bone spurs that form near the nerves in the neck. What are the signs or symptoms?  Pain. The pain may: ? Run from the neck to the arm and hand. ? Be very bad or irritating. ? Be worse when you move your neck.  Loss of feeling or tingling in your arm or hand.  Weakness in your arm or hand, in very bad cases. How is this treated? In many cases, treatment is not needed for this condition. With rest, the condition often gets better over time. If treatment is needed, options may include:  Wearing a soft neck collar (cervical collar) for short periods of time, as told by your doctor.  Doing exercises (physical therapy) to strengthen your neck muscles.  Taking medicines.  Having shots (injections) in your spine, in very bad cases.  Having surgery. This may be needed if other treatments do not help. The type of surgery that is used depends on the cause of your condition. Follow these instructions at home: If you have a soft neck collar:  Wear it as told by your doctor. Remove it only as told by your doctor.  Ask your doctor if you can remove the collar for cleaning and bathing. If you are allowed to remove the collar for cleaning or bathing: ? Follow  instructions from your doctor about how to remove the collar safely. ? Clean the collar by wiping it with mild soap and water and drying it completely. ? Take out any removable pads in the collar every 1-2 days. Wash them by hand with soap and water. Let them air-dry completely before you put them back in the collar. ? Check your skin under the collar for redness or sores. If you see any, tell your doctor. Managing pain      Take over-the-counter and prescription medicines only as told by your doctor.  If told, put ice on the painful area. ? If you have a soft neck collar, remove it as told by your doctor. ? Put ice in a plastic bag. ? Place a towel between your skin and the bag. ? Leave the ice on for 20 minutes, 2-3 times a day.  If using ice does not help, you can try using heat. Use the heat source that your doctor recommends, such as a moist heat pack or a heating pad. ? Place a towel between your skin and the heat source. ? Leave the heat on for 20-30 minutes. ? Remove the heat if your skin turns bright red. This is very important if you are unable to feel pain, heat, or cold. You may have a greater risk of getting burned.  You may try a gentle neck and shoulder rub (massage). Activity  Rest as needed.  Return to your normal activities as told by your doctor. Ask your doctor what activities are safe for you.  Do exercises as told by your doctor or physical therapist.  Do not lift anything that is heavier than 10 lb (4.5 kg) until your doctor tells you that it is safe. General instructions  Use a flat pillow when you sleep.  Do not drive while wearing a soft neck collar. If you do not have a soft neck collar, ask your doctor if it is safe to drive while your neck heals.  Ask your doctor if the medicine prescribed to you requires you to avoid driving or using heavy machinery.  Do not use any products that contain nicotine or tobacco, such as cigarettes, e-cigarettes, and  chewing tobacco. These can delay healing. If you need help quitting, ask your doctor.  Keep all follow-up visits as told by your doctor. This is important. Contact a doctor if:  Your condition does not get better with treatment. Get help right away if:  Your pain gets worse and is not helped with medicine.  You lose feeling or feel weak in your hand, arm, face, or leg.  You have a high fever.  You have a stiff neck.  You cannot control when you poop or pee (have incontinence).  You have trouble with walking, balance, or talking. Summary  Cervical radiculopathy means that a nerve in the neck is pinched or bruised.  A nerve can get pinched from a bulging disk, arthritis, an injury to the neck, or other causes.  Symptoms include pain, tingling, or loss of feeling that goes from the neck into the arm or hand.  Weakness in your arm or hand can happen in very bad cases.  Treatment may include resting, wearing a soft neck collar, and doing exercises. You might need to take medicines for pain. In very bad cases, shots or surgery may be needed. This information is not intended to replace advice given to you by your health care provider. Make sure you discuss any questions you have with your health care provider. Document Revised: 10/29/2018 Document Reviewed: 10/29/2018 Elsevier Patient Education  2020 Elsevier Inc.  

## 2020-10-01 NOTE — Telephone Encounter (Signed)
Met with the patient when he was in the clinic today.  He explained that he and his friend, [name], have found an apartment together and plan to split the rent.  They are planning to see the apartment this afternoon and have secured the lease with a friend's support.

## 2020-10-01 NOTE — Progress Notes (Signed)
Paramedicine Encounter  Arrived for clinic visit with Brad [name] at [facility] with Dr. Margarita Rana today. Ricke reports having continued increase in pain in right hand and shoulder with decrease of feeling in hands and feet. Dr. Margarita Rana assessed and increased Gabapentin to 3 capsules at bedtime. Started on Cymbalta. Bonifacio will be referred for physical therapy, colonscopy and is needing HF clinic and neurology appointment.  I will assist with same.  Pill box filled according to changes minus Cymbalta. I will speak to pharmacy in regards to bubble packs.  I will see patient in one week.   Patient reports he and his roommate are going to sign on an apartment today at [employer]. I will follow up on details.       ACTION: Home visit completed Next visit planned for one week

## 2020-10-01 NOTE — Progress Notes (Signed)
Having numbness in hands.  Having very bad neck pain.
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
## 2020-10-02 LAB — BASIC METABOLIC PANEL
BUN/Creatinine Ratio: 13 (ref 10–24)
BUN: 18 mg/dL (ref 8–27)
CO2: 21 mmol/L (ref 20–29)
Calcium: 8.9 mg/dL (ref 8.6–10.2)
Chloride: 96 mmol/L (ref 96–106)
Creatinine, Ser: 1.41 mg/dL — ABNORMAL HIGH (ref 0.76–1.27)
GFR calc Af Amer: 62 mL/min/{1.73_m2} (ref >59)
GFR calc non Af Amer: 54 mL/min/{1.73_m2} — ABNORMAL LOW (ref >59)
Glucose: 139 mg/dL — ABNORMAL HIGH (ref 65–99)
Potassium: 4.8 mmol/L (ref 3.5–5.2)
Sodium: 132 mmol/L — ABNORMAL LOW (ref 134–144)

## 2020-10-02 LAB — T4, FREE: Free T4: 1.78 ng/dL — ABNORMAL HIGH (ref 0.82–1.77)

## 2020-10-02 LAB — TSH: TSH: 2.59 u[IU]/mL (ref 0.450–4.500)

## 2020-10-02 IMAGING — CR DG CHEST 2V
2 series · 2 of 2 positions shown · non-contrast
Comparison: August 21, 2019

CLINICAL DATA: Chest pain for 3 days

EXAM:
CHEST - 2 VIEW

[chest pa]
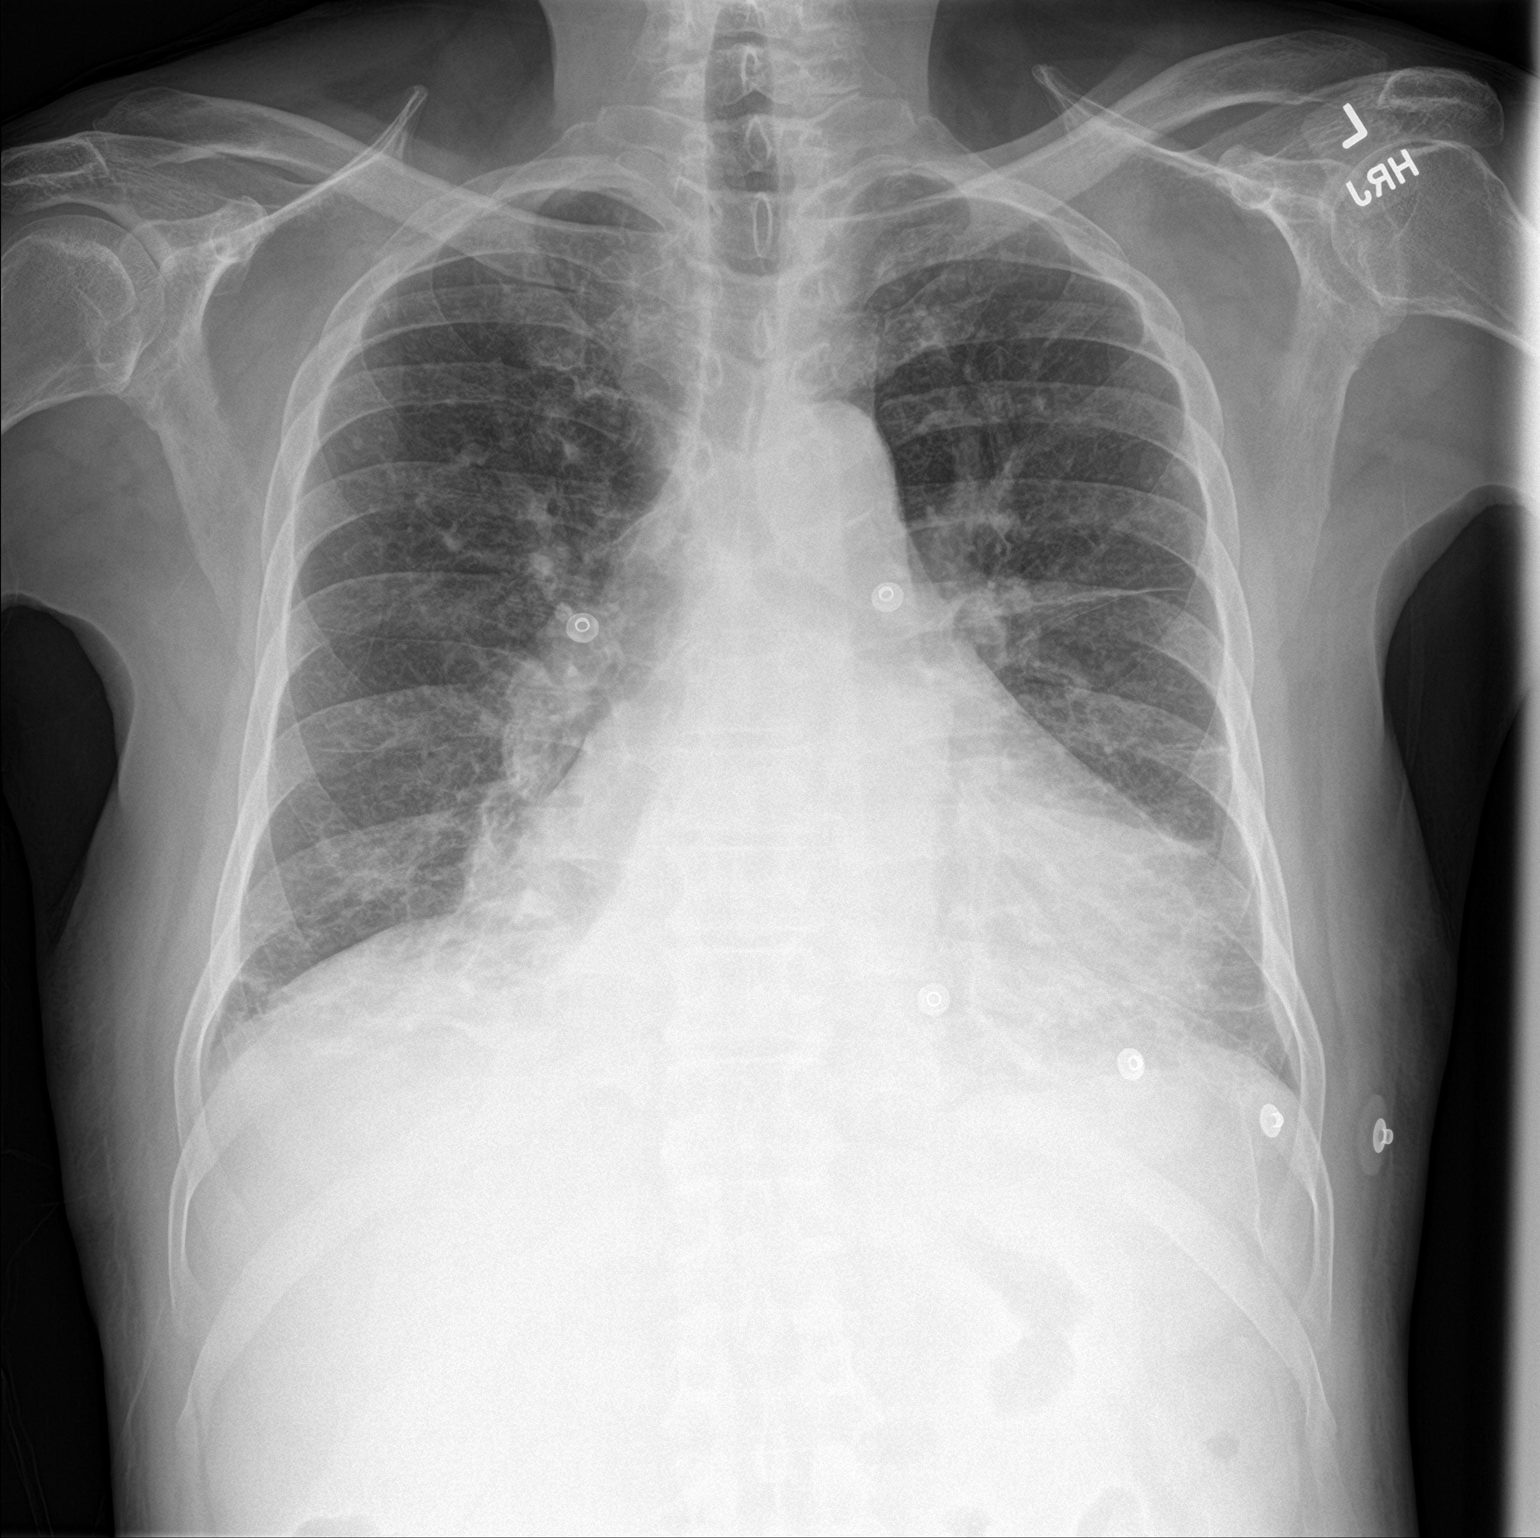

[chest lat]
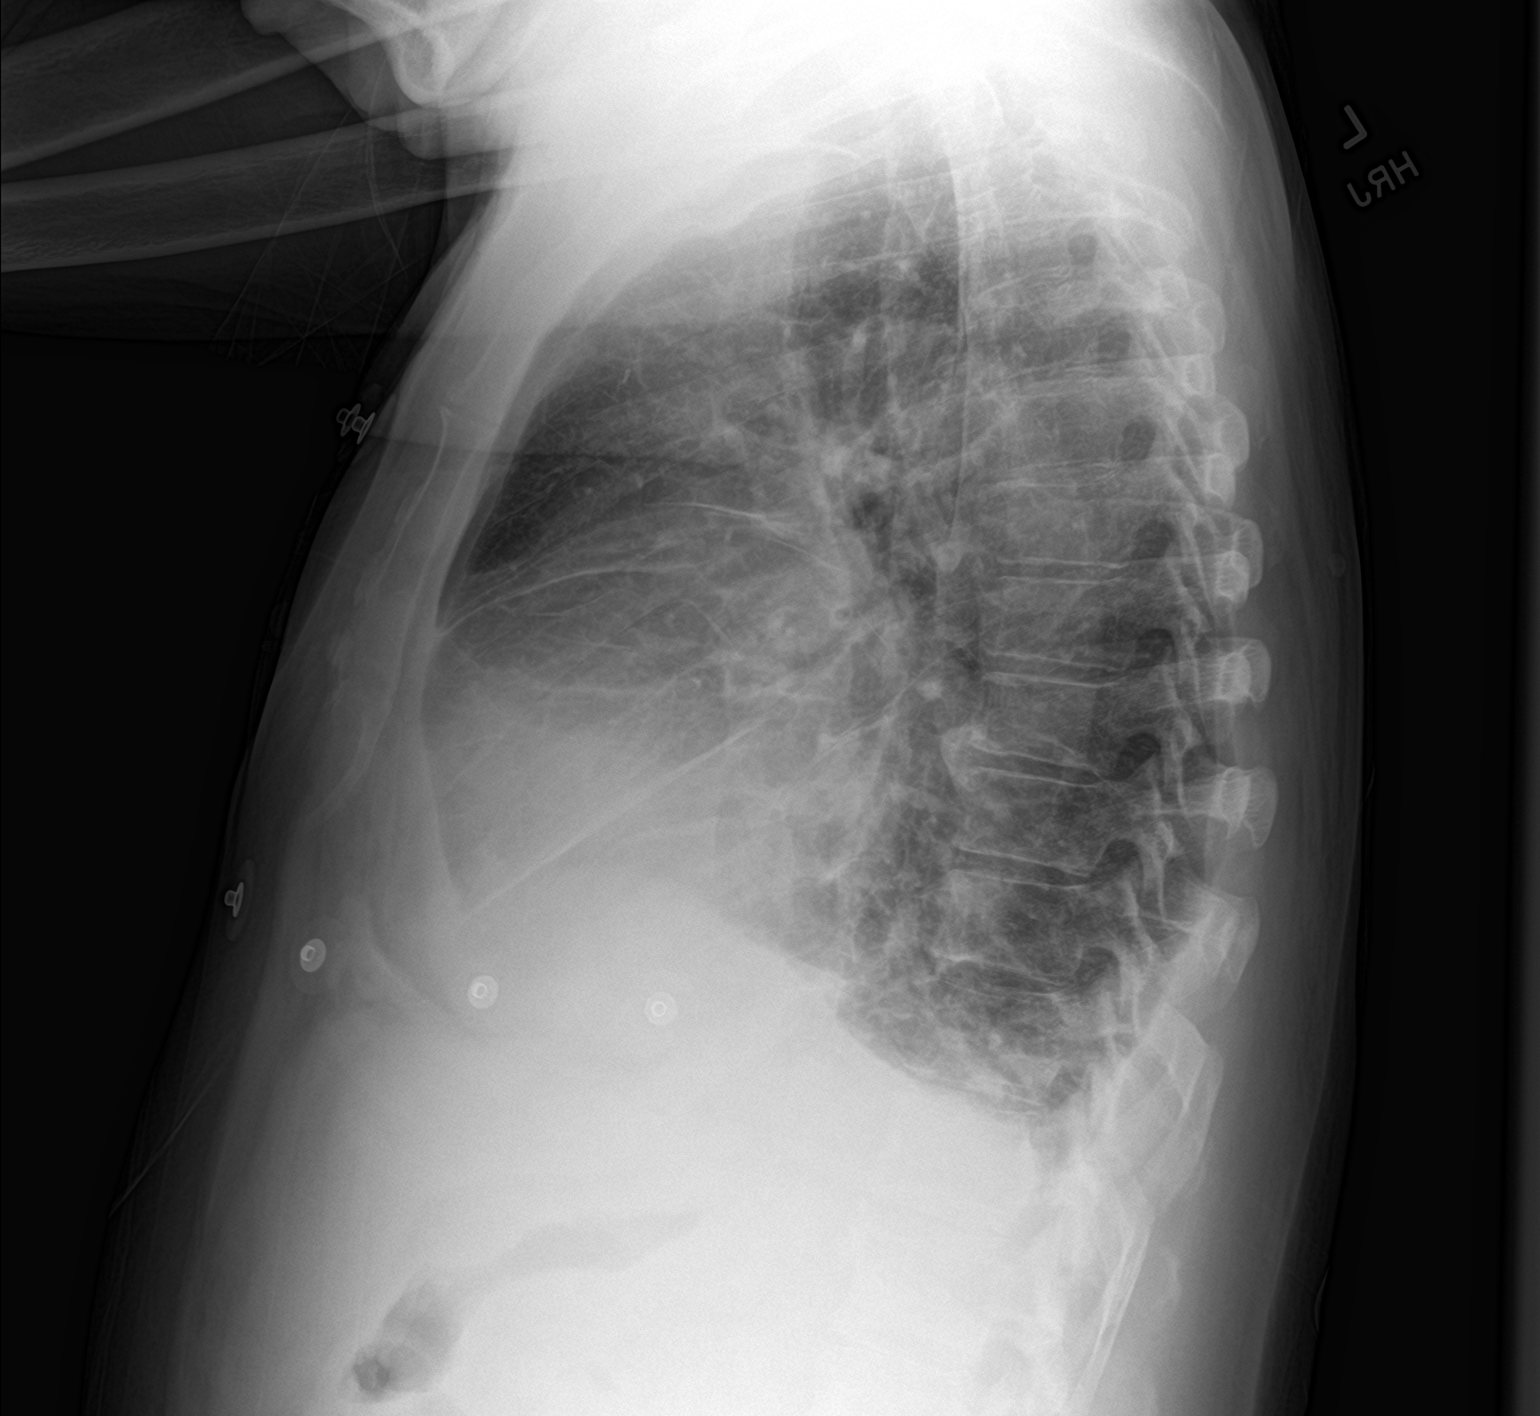

[2 of 2 positions shown; findings below may reference images not displayed]

FINDINGS: The mediastinal contour is normal. Heart size is enlarged. Mild
linear opacities are identified in both lung bases and in the left
mid lung favor atelectasis. Trace pleural effusion is identified
bilaterally. Mild increased pulmonary interstitium is identified
bilaterally. The bony structures are stable.
IMPRESSION: Cardiomegaly with mild increased pulmonary interstitium bilaterally.
This can be seen in mild interstitial edema.

Mild linear opacities are identified in both lungs, favor
atelectasis.

## 2020-10-05 ENCOUNTER — Telehealth: Payer: Self-pay

## 2020-10-05 ENCOUNTER — Other Ambulatory Visit: Payer: Self-pay

## 2020-10-05 ENCOUNTER — Emergency Department (HOSPITAL_COMMUNITY): Payer: Medicaid Other

## 2020-10-05 ENCOUNTER — Emergency Department (HOSPITAL_COMMUNITY)
Admission: EM | Admit: 2020-10-05 | Discharge: 2020-10-05 | Disposition: A | Discharge status: Home / Self Care | Payer: Medicaid Other | Attending: Emergency Medicine | Admitting: Emergency Medicine

## 2020-10-05 DIAGNOSIS — E1122 Type 2 diabetes mellitus with diabetic chronic kidney disease: Secondary | ICD-10-CM | POA: Insufficient documentation

## 2020-10-05 DIAGNOSIS — I2510 Atherosclerotic heart disease of native coronary artery without angina pectoris: Secondary | ICD-10-CM | POA: Diagnosis not present

## 2020-10-05 DIAGNOSIS — Z96652 Presence of left artificial knee joint: Secondary | ICD-10-CM | POA: Diagnosis not present

## 2020-10-05 DIAGNOSIS — R0602 Shortness of breath: Secondary | ICD-10-CM | POA: Insufficient documentation

## 2020-10-05 DIAGNOSIS — M542 Cervicalgia: Secondary | ICD-10-CM

## 2020-10-05 DIAGNOSIS — F17210 Nicotine dependence, cigarettes, uncomplicated: Secondary | ICD-10-CM | POA: Diagnosis not present

## 2020-10-05 DIAGNOSIS — I130 Hypertensive heart and chronic kidney disease with heart failure and stage 1 through stage 4 chronic kidney disease, or unspecified chronic kidney disease: Secondary | ICD-10-CM | POA: Insufficient documentation

## 2020-10-05 DIAGNOSIS — I5042 Chronic combined systolic (congestive) and diastolic (congestive) heart failure: Secondary | ICD-10-CM | POA: Insufficient documentation

## 2020-10-05 DIAGNOSIS — N183 Chronic kidney disease, stage 3 unspecified: Secondary | ICD-10-CM | POA: Insufficient documentation

## 2020-10-05 DIAGNOSIS — Z955 Presence of coronary angioplasty implant and graft: Secondary | ICD-10-CM | POA: Diagnosis not present

## 2020-10-05 DIAGNOSIS — Z79899 Other long term (current) drug therapy: Secondary | ICD-10-CM | POA: Diagnosis not present

## 2020-10-05 DIAGNOSIS — E1140 Type 2 diabetes mellitus with diabetic neuropathy, unspecified: Secondary | ICD-10-CM | POA: Diagnosis not present

## 2020-10-05 DIAGNOSIS — Z7984 Long term (current) use of oral hypoglycemic drugs: Secondary | ICD-10-CM | POA: Insufficient documentation

## 2020-10-05 LAB — CBC
HCT: 34.2 % — ABNORMAL LOW (ref 39.0–52.0)
Hemoglobin: 11.1 g/dL — ABNORMAL LOW (ref 13.0–17.0)
MCH: 28.6 pg (ref 26.0–34.0)
MCHC: 32.5 g/dL (ref 30.0–36.0)
MCV: 88.1 fL (ref 80.0–100.0)
Platelets: 227 10*3/uL (ref 150–400)
RBC: 3.88 MIL/uL — ABNORMAL LOW (ref 4.22–5.81)
RDW: 15.6 % — ABNORMAL HIGH (ref 11.5–15.5)
WBC: 5 10*3/uL (ref 4.0–10.5)
nRBC: 0 % (ref 0.0–0.2)

## 2020-10-05 LAB — COMPREHENSIVE METABOLIC PANEL
ALT: 18 U/L (ref 0–44)
AST: 26 U/L (ref 15–41)
Albumin: 2.8 g/dL — ABNORMAL LOW (ref 3.5–5.0)
Alkaline Phosphatase: 46 U/L (ref 38–126)
Anion gap: 11 (ref 5–15)
BUN: 19 mg/dL (ref 6–20)
CO2: 21 mmol/L — ABNORMAL LOW (ref 22–32)
Calcium: 8.4 mg/dL — ABNORMAL LOW (ref 8.9–10.3)
Chloride: 96 mmol/L — ABNORMAL LOW (ref 98–111)
Creatinine, Ser: 1.42 mg/dL — ABNORMAL HIGH (ref 0.61–1.24)
GFR, Estimated: 53 mL/min — ABNORMAL LOW (ref >60)
Glucose, Bld: 127 mg/dL — ABNORMAL HIGH (ref 70–99)
Potassium: 4.3 mmol/L (ref 3.5–5.1)
Sodium: 128 mmol/L — ABNORMAL LOW (ref 135–145)
Total Bilirubin: 0.8 mg/dL (ref 0.3–1.2)
Total Protein: 6.1 g/dL — ABNORMAL LOW (ref 6.5–8.1)

## 2020-10-05 LAB — BRAIN NATRIURETIC PEPTIDE: B Natriuretic Peptide: 778.6 pg/mL — ABNORMAL HIGH (ref 0.0–100.0)

## 2020-10-05 MED ORDER — HYDROCODONE-ACETAMINOPHEN 5-325 MG PO TABS
2.0000 | ORAL_TABLET | Freq: Once | ORAL | Status: AC
  Administered 2020-10-05: 2 via ORAL
  Filled 2020-10-05: qty 2

## 2020-10-05 MED ORDER — FUROSEMIDE 20 MG PO TABS
40.0000 mg | ORAL_TABLET | Freq: Once | ORAL | Status: AC
  Administered 2020-10-05: 40 mg via ORAL
  Filled 2020-10-05: qty 2

## 2020-10-05 MED ORDER — MORPHINE SULFATE (PF) 2 MG/ML IV SOLN
2.0000 mg | Freq: Once | INTRAVENOUS | Status: AC
  Administered 2020-10-05: 2 mg via INTRAVENOUS
  Filled 2020-10-05: qty 1

## 2020-10-05 NOTE — ED Triage Notes (Signed)
Pt came by EMS. Pt called due to generalized chronic pain that's mainly in his neck. One week ago pt had a change to his medications. Pt decreased on fluid pill and gabapentin. Pt states its a 10/10 pain. Pt usually takes percocet but stopped because it makes him feel sick. AxO x4 on arrival to ED.   EMS vitals  BP- 170/100 P- 86 O2- 97 CBG- 122

## 2020-10-05 NOTE — ED Provider Notes (Signed)
Medical screening examination/treatment/procedure(s) were conducted as a shared visit with non-physician practitioner(s) and myself.  I personally evaluated the patient during the encounter.  EKG Interpretation  Date/Time:  Friday October 05 2020 08:14:42 EDT Ventricular Rate:  78 PR Interval:    QRS Duration: 109 QT Interval:  420 QTC Calculation: 479 R Axis:   128 Text Interpretation: Atrial fibrillation Anterior infarct, old no sig change from previous Confirmed by Charlesetta Shanks [phone]) on 10/05/2020 8:49:05 AM   Patient has several chief complaints.  1 chief complaint is pain throughout both arms and legs.  Burning in quality.  Patient reports numbness.  Has been a chronic and recurrent problem.  Patient has prior history of ACDF.  Second complaint is shortness of breath.  This occurs recurrently.  He denies fever cough.  Patient has known history of A. fib.  He is not on anticoagulants.  He does not know why he was taken off of them.  Patient is alert with clear mental status.  No respiratory distress at rest.  Heart irregularly irregular rate controlled no gross rub murmur gallop.  Lungs are bilaterally clear to auscultation.  Strategies are warm and dry.  There is no peripheral edema.  Skin condition is good.  Distal pulses are 2+x4 extremities.  I agree with plan of management.   Charlesetta Shanks, MD 10/05/20 [phone]

## 2020-10-05 NOTE — Telephone Encounter (Signed)
Patient name and DOB has been verified Patient was informed of lab results. Patient had no questions.  

## 2020-10-05 NOTE — Discharge Instructions (Signed)
Thank you for letting us take care of you in the ER today.  Your work-up today was overall reassuring.  As discussed, please follow-up with your primary care doctor for possible referral to pain management.  Please follow-up with your pain medicine provider to have your medicine readjusted.  Please make sure to keep your cardiology appointment to follow-up for your atrial fibrillation.  Return to the ER for any new or worsening symptoms

## 2020-10-05 NOTE — Telephone Encounter (Signed)
-----   Message from Charlott Rakes, MD sent at 10/02/2020  1:43 PM EDT ----- Sodium is slightly low but has improved significantly compared to previous labs.  Thyroid levels are normal.  No additional regimen changes need to be made
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
## 2020-10-08 ENCOUNTER — Inpatient Hospital Stay (HOSPITAL_COMMUNITY): Payer: Medicaid Other

## 2020-10-08 ENCOUNTER — Encounter (HOSPITAL_COMMUNITY): Payer: Self-pay | Admitting: Emergency Medicine

## 2020-10-08 ENCOUNTER — Inpatient Hospital Stay (HOSPITAL_COMMUNITY)
Admission: EM | Admit: 2020-10-08 | Discharge: 2020-10-12 | DRG: 641 | Disposition: A | Discharge status: Home / Self Care | Payer: Medicaid Other | Attending: Internal Medicine | Admitting: Internal Medicine

## 2020-10-08 ENCOUNTER — Emergency Department (HOSPITAL_COMMUNITY): Payer: Medicaid Other

## 2020-10-08 DIAGNOSIS — Z8249 Family history of ischemic heart disease and other diseases of the circulatory system: Secondary | ICD-10-CM | POA: Diagnosis not present

## 2020-10-08 DIAGNOSIS — I5042 Chronic combined systolic (congestive) and diastolic (congestive) heart failure: Secondary | ICD-10-CM | POA: Diagnosis present

## 2020-10-08 DIAGNOSIS — Z981 Arthrodesis status: Secondary | ICD-10-CM | POA: Diagnosis not present

## 2020-10-08 DIAGNOSIS — I10 Essential (primary) hypertension: Secondary | ICD-10-CM | POA: Diagnosis not present

## 2020-10-08 DIAGNOSIS — E119 Type 2 diabetes mellitus without complications: Secondary | ICD-10-CM

## 2020-10-08 DIAGNOSIS — R112 Nausea with vomiting, unspecified: Secondary | ICD-10-CM

## 2020-10-08 DIAGNOSIS — Z20822 Contact with and (suspected) exposure to covid-19: Secondary | ICD-10-CM | POA: Diagnosis present

## 2020-10-08 DIAGNOSIS — Z888 Allergy status to other drugs, medicaments and biological substances status: Secondary | ICD-10-CM

## 2020-10-08 DIAGNOSIS — F17210 Nicotine dependence, cigarettes, uncomplicated: Secondary | ICD-10-CM | POA: Diagnosis present

## 2020-10-08 DIAGNOSIS — F1910 Other psychoactive substance abuse, uncomplicated: Secondary | ICD-10-CM | POA: Diagnosis present

## 2020-10-08 DIAGNOSIS — F1411 Cocaine abuse, in remission: Secondary | ICD-10-CM | POA: Diagnosis present

## 2020-10-08 DIAGNOSIS — Z79899 Other long term (current) drug therapy: Secondary | ICD-10-CM

## 2020-10-08 DIAGNOSIS — I4821 Permanent atrial fibrillation: Secondary | ICD-10-CM | POA: Diagnosis present

## 2020-10-08 DIAGNOSIS — E1122 Type 2 diabetes mellitus with diabetic chronic kidney disease: Secondary | ICD-10-CM | POA: Diagnosis present

## 2020-10-08 DIAGNOSIS — E1140 Type 2 diabetes mellitus with diabetic neuropathy, unspecified: Secondary | ICD-10-CM | POA: Diagnosis present

## 2020-10-08 DIAGNOSIS — G4700 Insomnia, unspecified: Secondary | ICD-10-CM | POA: Diagnosis present

## 2020-10-08 DIAGNOSIS — Z794 Long term (current) use of insulin: Secondary | ICD-10-CM

## 2020-10-08 DIAGNOSIS — K219 Gastro-esophageal reflux disease without esophagitis: Secondary | ICD-10-CM | POA: Diagnosis present

## 2020-10-08 DIAGNOSIS — K7030 Alcoholic cirrhosis of liver without ascites: Secondary | ICD-10-CM | POA: Diagnosis present

## 2020-10-08 DIAGNOSIS — E1159 Type 2 diabetes mellitus with other circulatory complications: Secondary | ICD-10-CM

## 2020-10-08 DIAGNOSIS — D696 Thrombocytopenia, unspecified: Secondary | ICD-10-CM | POA: Diagnosis present

## 2020-10-08 DIAGNOSIS — N1831 Chronic kidney disease, stage 3a: Secondary | ICD-10-CM | POA: Diagnosis present

## 2020-10-08 DIAGNOSIS — I130 Hypertensive heart and chronic kidney disease with heart failure and stage 1 through stage 4 chronic kidney disease, or unspecified chronic kidney disease: Secondary | ICD-10-CM | POA: Diagnosis present

## 2020-10-08 DIAGNOSIS — Z7982 Long term (current) use of aspirin: Secondary | ICD-10-CM | POA: Diagnosis not present

## 2020-10-08 DIAGNOSIS — B182 Chronic viral hepatitis C: Secondary | ICD-10-CM | POA: Diagnosis present

## 2020-10-08 DIAGNOSIS — E871 Hypo-osmolality and hyponatremia: Principal | ICD-10-CM | POA: Diagnosis present

## 2020-10-08 DIAGNOSIS — F32A Depression, unspecified: Secondary | ICD-10-CM | POA: Diagnosis present

## 2020-10-08 DIAGNOSIS — R8271 Bacteriuria: Secondary | ICD-10-CM

## 2020-10-08 DIAGNOSIS — R202 Paresthesia of skin: Secondary | ICD-10-CM

## 2020-10-08 DIAGNOSIS — M4802 Spinal stenosis, cervical region: Secondary | ICD-10-CM | POA: Diagnosis present

## 2020-10-08 DIAGNOSIS — R197 Diarrhea, unspecified: Secondary | ICD-10-CM

## 2020-10-08 DIAGNOSIS — Z8546 Personal history of malignant neoplasm of prostate: Secondary | ICD-10-CM

## 2020-10-08 DIAGNOSIS — E7800 Pure hypercholesterolemia, unspecified: Secondary | ICD-10-CM | POA: Diagnosis present

## 2020-10-08 DIAGNOSIS — F10239 Alcohol dependence with withdrawal, unspecified: Secondary | ICD-10-CM | POA: Diagnosis present

## 2020-10-08 DIAGNOSIS — F1020 Alcohol dependence, uncomplicated: Secondary | ICD-10-CM | POA: Diagnosis present

## 2020-10-08 DIAGNOSIS — Z96652 Presence of left artificial knee joint: Secondary | ICD-10-CM | POA: Diagnosis present

## 2020-10-08 DIAGNOSIS — Z8673 Personal history of transient ischemic attack (TIA), and cerebral infarction without residual deficits: Secondary | ICD-10-CM

## 2020-10-08 DIAGNOSIS — R0602 Shortness of breath: Secondary | ICD-10-CM

## 2020-10-08 DIAGNOSIS — N3090 Cystitis, unspecified without hematuria: Secondary | ICD-10-CM | POA: Diagnosis present

## 2020-10-08 DIAGNOSIS — Z23 Encounter for immunization: Secondary | ICD-10-CM | POA: Diagnosis not present

## 2020-10-08 DIAGNOSIS — D631 Anemia in chronic kidney disease: Secondary | ICD-10-CM | POA: Diagnosis present

## 2020-10-08 DIAGNOSIS — E785 Hyperlipidemia, unspecified: Secondary | ICD-10-CM | POA: Diagnosis present

## 2020-10-08 DIAGNOSIS — I2510 Atherosclerotic heart disease of native coronary artery without angina pectoris: Secondary | ICD-10-CM | POA: Diagnosis present

## 2020-10-08 DIAGNOSIS — I252 Old myocardial infarction: Secondary | ICD-10-CM

## 2020-10-08 DIAGNOSIS — N183 Chronic kidney disease, stage 3 unspecified: Secondary | ICD-10-CM | POA: Diagnosis present

## 2020-10-08 LAB — URINALYSIS, ROUTINE W REFLEX MICROSCOPIC
Bilirubin Urine: NEGATIVE
Glucose, UA: >=500 mg/dL — AB
Ketones, ur: NEGATIVE mg/dL
Leukocytes,Ua: NEGATIVE
Nitrite: NEGATIVE
Protein, ur: 100 mg/dL — AB
Specific Gravity, Urine: 1.01 (ref 1.005–1.030)
pH: 5 (ref 5.0–8.0)

## 2020-10-08 LAB — RESPIRATORY PANEL BY RT PCR (FLU A&B, COVID)
Influenza A by PCR: NEGATIVE
Influenza B by PCR: NEGATIVE
SARS Coronavirus 2 by RT PCR: NEGATIVE

## 2020-10-08 LAB — BASIC METABOLIC PANEL
Anion gap: 9 (ref 5–15)
Anion gap: 9 (ref 5–15)
Anion gap: 9 (ref 5–15)
Anion gap: 5 (ref 5–15)
BUN: 18 mg/dL (ref 6–20)
BUN: 23 mg/dL — ABNORMAL HIGH (ref 6–20)
BUN: 24 mg/dL — ABNORMAL HIGH (ref 6–20)
BUN: 26 mg/dL — ABNORMAL HIGH (ref 6–20)
CO2: 19 mmol/L — ABNORMAL LOW (ref 22–32)
CO2: 22 mmol/L (ref 22–32)
CO2: 24 mmol/L (ref 22–32)
CO2: 23 mmol/L (ref 22–32)
Calcium: 7.9 mg/dL — ABNORMAL LOW (ref 8.9–10.3)
Calcium: 7.9 mg/dL — ABNORMAL LOW (ref 8.9–10.3)
Calcium: 8 mg/dL — ABNORMAL LOW (ref 8.9–10.3)
Calcium: 7.4 mg/dL — ABNORMAL LOW (ref 8.9–10.3)
Chloride: 87 mmol/L — ABNORMAL LOW (ref 98–111)
Chloride: 84 mmol/L — ABNORMAL LOW (ref 98–111)
Chloride: 85 mmol/L — ABNORMAL LOW (ref 98–111)
Chloride: 86 mmol/L — ABNORMAL LOW (ref 98–111)
Creatinine, Ser: 1.19 mg/dL (ref 0.61–1.24)
Creatinine, Ser: 1.32 mg/dL — ABNORMAL HIGH (ref 0.61–1.24)
Creatinine, Ser: 1.37 mg/dL — ABNORMAL HIGH (ref 0.61–1.24)
Creatinine, Ser: 1.34 mg/dL — ABNORMAL HIGH (ref 0.61–1.24)
GFR, Estimated: >60 mL/min (ref >60)
GFR, Estimated: 58 mL/min — ABNORMAL LOW (ref >60)
GFR, Estimated: 56 mL/min — ABNORMAL LOW (ref >60)
GFR, Estimated: 57 mL/min — ABNORMAL LOW (ref >60)
Glucose, Bld: 127 mg/dL — ABNORMAL HIGH (ref 70–99)
Glucose, Bld: 124 mg/dL — ABNORMAL HIGH (ref 70–99)
Glucose, Bld: 122 mg/dL — ABNORMAL HIGH (ref 70–99)
Glucose, Bld: 131 mg/dL — ABNORMAL HIGH (ref 70–99)
Potassium: 4.1 mmol/L (ref 3.5–5.1)
Potassium: 4.4 mmol/L (ref 3.5–5.1)
Potassium: 4.4 mmol/L (ref 3.5–5.1)
Potassium: 4.3 mmol/L (ref 3.5–5.1)
Sodium: 115 mmol/L — CL (ref 135–145)
Sodium: 115 mmol/L — CL (ref 135–145)
Sodium: 118 mmol/L — CL (ref 135–145)
Sodium: 114 mmol/L — CL (ref 135–145)

## 2020-10-08 LAB — RAPID URINE DRUG SCREEN, HOSP PERFORMED
Amphetamines: NOT DETECTED
Barbiturates: NOT DETECTED
Benzodiazepines: NOT DETECTED
Cocaine: NOT DETECTED
Opiates: NOT DETECTED
Tetrahydrocannabinol: NOT DETECTED

## 2020-10-08 LAB — COMPREHENSIVE METABOLIC PANEL
ALT: 18 U/L (ref 0–44)
AST: 23 U/L (ref 15–41)
Albumin: 2.9 g/dL — ABNORMAL LOW (ref 3.5–5.0)
Alkaline Phosphatase: 42 U/L (ref 38–126)
Anion gap: 11 (ref 5–15)
BUN: 17 mg/dL (ref 6–20)
CO2: 21 mmol/L — ABNORMAL LOW (ref 22–32)
Calcium: 8.2 mg/dL — ABNORMAL LOW (ref 8.9–10.3)
Chloride: 85 mmol/L — ABNORMAL LOW (ref 98–111)
Creatinine, Ser: 1.16 mg/dL (ref 0.61–1.24)
GFR, Estimated: >60 mL/min (ref >60)
Glucose, Bld: 125 mg/dL — ABNORMAL HIGH (ref 70–99)
Potassium: 4 mmol/L (ref 3.5–5.1)
Sodium: 117 mmol/L — CL (ref 135–145)
Total Bilirubin: 0.8 mg/dL (ref 0.3–1.2)
Total Protein: 6.2 g/dL — ABNORMAL LOW (ref 6.5–8.1)

## 2020-10-08 LAB — CBG MONITORING, ED
Glucose-Capillary: 143 mg/dL — ABNORMAL HIGH (ref 70–99)
Glucose-Capillary: 120 mg/dL — ABNORMAL HIGH (ref 70–99)
Glucose-Capillary: 131 mg/dL — ABNORMAL HIGH (ref 70–99)

## 2020-10-08 LAB — CREATININE, URINE, RANDOM: Creatinine, Urine: 78.26 mg/dL

## 2020-10-08 LAB — CBC
HCT: 34.7 % — ABNORMAL LOW (ref 39.0–52.0)
Hemoglobin: 12 g/dL — ABNORMAL LOW (ref 13.0–17.0)
MCH: 29.5 pg (ref 26.0–34.0)
MCHC: 34.6 g/dL (ref 30.0–36.0)
MCV: 85.3 fL (ref 80.0–100.0)
Platelets: 219 10*3/uL (ref 150–400)
RBC: 4.07 MIL/uL — ABNORMAL LOW (ref 4.22–5.81)
RDW: 14.7 % (ref 11.5–15.5)
WBC: 4.5 10*3/uL (ref 4.0–10.5)
nRBC: 0 % (ref 0.0–0.2)

## 2020-10-08 LAB — URIC ACID: Uric Acid, Serum: 4.8 mg/dL (ref 3.7–8.6)

## 2020-10-08 LAB — GLUCOSE, CAPILLARY: Glucose-Capillary: 180 mg/dL — ABNORMAL HIGH (ref 70–99)

## 2020-10-08 LAB — OSMOLALITY, URINE: Osmolality, Ur: 351 mOsm/kg (ref 300–900)

## 2020-10-08 LAB — TSH: TSH: 2.757 u[IU]/mL (ref 0.350–4.500)

## 2020-10-08 LAB — LIPASE, BLOOD: Lipase: 31 U/L (ref 11–51)

## 2020-10-08 LAB — OSMOLALITY: Osmolality: 254 mOsm/kg — ABNORMAL LOW (ref 275–295)

## 2020-10-08 LAB — DIGOXIN LEVEL: Digoxin Level: <0.2 ng/mL — ABNORMAL LOW (ref 1.0–2.0)

## 2020-10-08 LAB — SODIUM, URINE, RANDOM: Sodium, Ur: <10 mmol/L

## 2020-10-08 LAB — BRAIN NATRIURETIC PEPTIDE: B Natriuretic Peptide: 960.7 pg/mL — ABNORMAL HIGH (ref 0.0–100.0)

## 2020-10-08 MED ORDER — INFLUENZA VAC SPLIT QUAD 0.5 ML IM SUSY
0.5000 mL | PREFILLED_SYRINGE | INTRAMUSCULAR | Status: AC
  Administered 2020-10-09: 0.5 mL via INTRAMUSCULAR
  Filled 2020-10-08: qty 0.5

## 2020-10-08 MED ORDER — FUROSEMIDE 10 MG/ML IJ SOLN
40.0000 mg | Freq: Two times a day (BID) | INTRAMUSCULAR | Status: DC
  Administered 2020-10-08: 40 mg via INTRAVENOUS
  Filled 2020-10-08: qty 4

## 2020-10-08 MED ORDER — FOLIC ACID 1 MG PO TABS: 1.0000 mg | ORAL_TABLET | Freq: Every day | ORAL | Status: DC

## 2020-10-08 MED ORDER — THIAMINE HCL 100 MG/ML IJ SOLN
100.0000 mg | Freq: Every day | INTRAMUSCULAR | Status: DC
  Filled 2020-10-08 (×2): qty 2

## 2020-10-08 MED ORDER — ATORVASTATIN CALCIUM 20 MG PO TABS
20.0000 mg | ORAL_TABLET | Freq: Every day | ORAL | Status: DC
  Administered 2020-10-08 – 2020-10-11 (×3): 20 mg via ORAL
  Filled 2020-10-08 (×3): qty 1

## 2020-10-08 MED ORDER — LORAZEPAM 1 MG PO TABS: 1.0000 mg | ORAL_TABLET | ORAL | Status: AC | PRN

## 2020-10-08 MED ORDER — THIAMINE HCL 100 MG PO TABS
100.0000 mg | ORAL_TABLET | Freq: Every day | ORAL | Status: DC
  Administered 2020-10-08 – 2020-10-12 (×5): 100 mg via ORAL
  Filled 2020-10-08 (×5): qty 1

## 2020-10-08 MED ORDER — FOLIC ACID 1 MG PO TABS
1.0000 mg | ORAL_TABLET | Freq: Every day | ORAL | Status: DC
  Administered 2020-10-08 – 2020-10-12 (×5): 1 mg via ORAL
  Filled 2020-10-08 (×5): qty 1

## 2020-10-08 MED ORDER — SODIUM CHLORIDE 1 G PO TABS
1.0000 g | ORAL_TABLET | Freq: Once | ORAL | Status: AC
  Administered 2020-10-08: 1 g via ORAL
  Filled 2020-10-08: qty 1

## 2020-10-08 MED ORDER — AMIODARONE HCL 200 MG PO TABS
200.0000 mg | ORAL_TABLET | Freq: Every day | ORAL | Status: DC
  Administered 2020-10-08 – 2020-10-12 (×5): 200 mg via ORAL
  Filled 2020-10-08 (×5): qty 1

## 2020-10-08 MED ORDER — POTASSIUM CHLORIDE CRYS ER 20 MEQ PO TBCR: 20.0000 meq | EXTENDED_RELEASE_TABLET | Freq: Every day | ORAL | Status: DC

## 2020-10-08 MED ORDER — GABAPENTIN 300 MG PO CAPS
900.0000 mg | ORAL_CAPSULE | Freq: Every day | ORAL | Status: DC
  Administered 2020-10-08 – 2020-10-11 (×4): 900 mg via ORAL
  Filled 2020-10-08 (×4): qty 3

## 2020-10-08 MED ORDER — DIGOXIN 125 MCG PO TABS: 125.0000 ug | ORAL_TABLET | Freq: Every day | ORAL | Status: DC

## 2020-10-08 MED ORDER — TORSEMIDE 20 MG PO TABS
20.0000 mg | ORAL_TABLET | Freq: Every day | ORAL | Status: DC
Start: 2020-10-08 — End: 2020-10-08

## 2020-10-08 MED ORDER — DULOXETINE HCL 60 MG PO CPEP
60.0000 mg | ORAL_CAPSULE | Freq: Every day | ORAL | Status: DC
  Administered 2020-10-08 – 2020-10-12 (×5): 60 mg via ORAL
  Filled 2020-10-08 (×3): qty 1
  Filled 2020-10-08: qty 2
  Filled 2020-10-08: qty 1

## 2020-10-08 MED ORDER — ACETAMINOPHEN 650 MG RE SUPP: 650.0000 mg | Freq: Four times a day (QID) | RECTAL | Status: DC | PRN

## 2020-10-08 MED ORDER — ACETAMINOPHEN 325 MG PO TABS
650.0000 mg | ORAL_TABLET | Freq: Four times a day (QID) | ORAL | Status: DC | PRN
  Administered 2020-10-08 – 2020-10-09 (×5): 650 mg via ORAL
  Filled 2020-10-08 (×5): qty 2

## 2020-10-08 MED ORDER — ENOXAPARIN SODIUM 40 MG/0.4ML ~~LOC~~ SOLN
40.0000 mg | SUBCUTANEOUS | Status: DC
  Administered 2020-10-08 – 2020-10-12 (×5): 40 mg via SUBCUTANEOUS
  Filled 2020-10-08 (×5): qty 0.4

## 2020-10-08 MED ORDER — LORAZEPAM 2 MG/ML IJ SOLN
1.0000 mg | INTRAMUSCULAR | Status: AC | PRN
  Administered 2020-10-08 (×2): 1 mg via INTRAVENOUS
  Administered 2020-10-09: 2 mg via INTRAVENOUS
  Filled 2020-10-08 (×3): qty 1

## 2020-10-08 MED ORDER — ADULT MULTIVITAMIN W/MINERALS CH
1.0000 | ORAL_TABLET | Freq: Every day | ORAL | Status: DC
  Administered 2020-10-08 – 2020-10-12 (×5): 1 via ORAL
  Filled 2020-10-08 (×5): qty 1

## 2020-10-08 MED ORDER — KETOROLAC TROMETHAMINE 30 MG/ML IJ SOLN
30.0000 mg | Freq: Once | INTRAMUSCULAR | Status: AC
  Administered 2020-10-08: 30 mg via INTRAVENOUS
  Filled 2020-10-08: qty 1

## 2020-10-08 MED ORDER — SODIUM CHLORIDE 0.9 % IV SOLN
1.0000 g | INTRAVENOUS | Status: DC
  Administered 2020-10-08: 1 g via INTRAVENOUS
  Filled 2020-10-08: qty 10

## 2020-10-08 MED ORDER — ISOSORB DINITRATE-HYDRALAZINE 20-37.5 MG PO TABS
1.0000 | ORAL_TABLET | Freq: Three times a day (TID) | ORAL | Status: DC
  Administered 2020-10-08 – 2020-10-12 (×12): 1 via ORAL
  Filled 2020-10-08 (×14): qty 1

## 2020-10-08 MED ORDER — AMIODARONE HCL 200 MG PO TABS: 200.0000 mg | ORAL_TABLET | Freq: Every day | ORAL | Status: DC

## 2020-10-08 MED ORDER — ALUM & MAG HYDROXIDE-SIMETH 200-200-20 MG/5ML PO SUSP
30.0000 mL | Freq: Once | ORAL | Status: AC
  Administered 2020-10-08: 30 mL via ORAL
  Filled 2020-10-08: qty 30

## 2020-10-08 MED ORDER — INSULIN ASPART 100 UNIT/ML ~~LOC~~ SOLN
0.0000 [IU] | Freq: Three times a day (TID) | SUBCUTANEOUS | Status: DC
  Administered 2020-10-08: 1 [IU] via SUBCUTANEOUS
  Administered 2020-10-09 – 2020-10-11 (×3): 2 [IU] via SUBCUTANEOUS
  Administered 2020-10-12: 1 [IU] via SUBCUTANEOUS
  Filled 2020-10-08: qty 0.09

## 2020-10-08 MED ORDER — MORPHINE SULFATE (PF) 2 MG/ML IV SOLN
1.0000 mg | Freq: Once | INTRAVENOUS | Status: AC
  Administered 2020-10-08: 1 mg via INTRAVENOUS
  Filled 2020-10-08: qty 1

## 2020-10-08 MED ORDER — SODIUM CHLORIDE 0.9 % IV SOLN: INTRAVENOUS | Status: DC
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
## 2020-10-08 NOTE — ED Notes (Addendum)
Date and time results received: 10/08/20 10:45 AM  Test: Na Critical Value: 115  Name of Provider Notified: [place]  Orders Received? Or Actions Taken?: Paged provider

## 2020-10-08 NOTE — Assessment & Plan Note (Signed)
-   no s/s decompensation at this time

## 2020-10-08 NOTE — Assessment & Plan Note (Signed)
-  Initially on Rocephin on admission due to possible UTI.  I do not appreciate any urinary symptoms and will hold off on further antibiotics at this time -Discontinue Rocephin

## 2020-10-08 NOTE — ED Triage Notes (Signed)
Pt BIB [facility] from home complaining of abdominal pain with N/V/D for 3 days. Also reports generalized body aches. Per EMS he was seen on 10/15 for the same, given morphine, percocet, and set home. No known COVID exposure. CBG 124

## 2020-10-08 NOTE — ED Notes (Signed)
Urinal given and pt advised that urine sample is needed for UA

## 2020-10-08 NOTE — Assessment & Plan Note (Signed)
-   continue asa, statin

## 2020-10-08 NOTE — Hospital Course (Addendum)
Brad [name] is a 59 y.o. male with medical history significant of chronic systolic and diastolic congestive heart failure, permanent atrial fibrillation, alcoholic cirrhosis, CKD stage III who is admitted with nausea, vomiting, and diarrhea. He continues to drink outpatient and has had multiple admissions for recurrent hyponatremia.  He had surgery for C4-C6 disease and underwent surgical fixation on 08/24/2020.  He has intermittent paresthesias but was evaluated by neurosurgery last hospitalization with no further evaluation recommended.  His Na on admission was 117 which further downtrended to 115. Nephrology was again consulted on admission; he was recommended to restart on IV lasix with fluid restriction.  Sodium levels further down trended, and on 10/09/2020 he was initiated on 3% saline.

## 2020-10-08 NOTE — Assessment & Plan Note (Signed)
-   history noted - monitor on CIWA as well

## 2020-10-08 NOTE — Progress Notes (Signed)
PROGRESS NOTE    Brad [name]   ZOX:[id number]  DOB: 14-Oct-1961  DOA: 10/08/2020     0  PCP: Charlott Rakes, MD  CC: N/V/D  Hospital Course: Brad [name] is a 59 y.o. male with medical history significant of chronic systolic and diastolic congestive heart failure, permanent atrial fibrillation, alcoholic cirrhosis, CKD stage III who is admitted with nausea, vomiting, and diarrhea. He continues to drink outpatient and has had multiple admissions for recurrent hyponatremia.  He had surgery for C4-C6 disease and underwent surgical fixation on 08/24/2020.  He has intermittent paresthesias but was evaluated by neurosurgery last hospitalization with no further evaluation recommended.  His Na on admission was 117 which further downtrended to 115. Nephrology was again consulted on admission; he was recommended to restart on IV lasix with fluid restriction.     Interval History:  Admitted overnight with nausea, vomiting, diarrhea at home.  He used to drink approximately 2 large beers daily.  He was admitted for recurrent hyponatremia once again.  Have again counseled him bedside regarding need for alcohol cessation.  Old records reviewed in assessment of this patient  ROS: Constitutional: positive for fatigue and malaise, negative for chills and fevers, Respiratory: negative for cough, Cardiovascular: negative for chest pain and Gastrointestinal: negative for abdominal pain  Assessment & Plan: Nausea vomiting and diarrhea -Differential includes gastroenteritis versus alcohol withdrawal -Follow-up stool studies if able to obtain  Hyponatremia - no obvious sxms yet (some possible confusion but AO when seen this am); Na initially 117 on admission but 115 on repeat check - given GI losses concern for volume depletion; also on diuretic at home - nephrology consulted, appreciate assistance; for now will trial lasix and fluid restriction - follow up urine studies; his serum osmo is  254 - may still end up needed 3% NS if Na further drops; trend BMP q4h  Chronic combined systolic and diastolic CHF (congestive heart failure) (HCC) - I do not appreciate much volume overload; CXR is clear; minimal LE edema - check BNP - follow up [place]; per nephrology we will try IV lasix and fluid restriction for now for hypoNa then re-evaluate with further BMPs - strict I&O - digoxin stopped last hospitalization 2/2 bradycardia  Asymptomatic bacteriuria -Initially on Rocephin on admission due to possible UTI.  I do not appreciate any urinary symptoms and will hold off on further antibiotics at this time -Discontinue Rocephin  Paresthesias - evaluated last admission. Felt to be worsened with hypoNa - s/p anterior cervical discectomy and fusion of C4-C6 on 07/24/2020.  Paresthesias were present prior MI continue long-term per neurosurgery -No further evaluation for this per neurosurgery -Continue gabapentin -Follow-up repeat MRI C-spine  Alcohol dependence syndrome ([place]) - monitor on CIWA - no s/s w/d at this time   Permanent atrial fibrillation - continue amiodarone - digoxin stopped last admission due to bradycardia - not on AC due to fall risk with alcohol use   CAD (coronary artery disease), native coronary artery - continue asa, statin  CKD (chronic kidney disease) stage 3, GFR 30-59 ml/min (HCC) - baseline creat ~1.3, currently at baseline - monitor BMP while on hypoNa treatment   Essential hypertension - continue current regimen; adjust as needed  Chronic hepatitis C with cirrhosis ([place]) - no s/s decompensation at this time  Polysubstance abuse ([place]) - history noted - monitor on CIWA as well     Antimicrobials: Rocephin on admission, not continued  DVT prophylaxis: Lovenox Code Status: Full Family Communication: None present Disposition  Plan: Status is: Inpatient  Remains inpatient appropriate because:Persistent severe electrolyte disturbances, Unsafe  d/c plan, IV treatments appropriate due to intensity of illness or inability to take PO and Inpatient level of care appropriate due to severity of illness   Dispo: The patient is from: Home              Anticipated d/c is to: Home              Anticipated d/c date is: 3 days              Patient currently is not medically stable to d/c.       Objective: Blood pressure (!) 152/94, pulse 76, temperature 98 F (36.7 C), temperature source Oral, resp. rate (!) 23, SpO2 100 %.  Examination: General appearance: alert, cooperative, no distress and Intermittent muscle jerks throughout Head: Normocephalic, without obvious abnormality, atraumatic Eyes: EOMI Lungs: clear to auscultation bilaterally Heart: regular rate and rhythm and S1, S2 normal Abdomen: normal findings: bowel sounds normal and soft, non-tender Extremities: Trace lower extremity edema Skin: mobility and turgor normal Neurologic: Grossly normal  Consultants:   Nephrology  Procedures:   None  Data Reviewed: I have personally reviewed following labs and imaging studies Results for orders placed or performed during the hospital encounter of 10/08/20 (from the past 24 hour(s))  Lipase, blood     Status: None   Collection Time: 10/08/20  1:41 AM  Result Value Ref Range   Lipase 31 11 - 51 U/L  Comprehensive metabolic panel     Status: Abnormal   Collection Time: 10/08/20  1:41 AM  Result Value Ref Range   Sodium 117 (LL) 135 - 145 mmol/L   Potassium 4.0 3.5 - 5.1 mmol/L   Chloride 85 (L) 98 - 111 mmol/L   CO2 21 (L) 22 - 32 mmol/L   Glucose, Bld 125 (H) 70 - 99 mg/dL   BUN 17 6 - 20 mg/dL   Creatinine, Ser 1.16 0.61 - 1.24 mg/dL   Calcium 8.2 (L) 8.9 - 10.3 mg/dL   Total Protein 6.2 (L) 6.5 - 8.1 g/dL   Albumin 2.9 (L) 3.5 - 5.0 g/dL   AST 23 15 - 41 U/L   ALT 18 0 - 44 U/L   Alkaline Phosphatase 42 38 - 126 U/L   Total Bilirubin 0.8 0.3 - 1.2 mg/dL   GFR, Estimated >60 >60 mL/min   Anion gap 11 5 - 15   CBC     Status: Abnormal   Collection Time: 10/08/20  1:41 AM  Result Value Ref Range   WBC 4.5 4.0 - 10.5 K/uL   RBC 4.07 (L) 4.22 - 5.81 MIL/uL   Hemoglobin 12.0 (L) 13.0 - 17.0 g/dL   HCT 34.7 (L) 39 - 52 %   MCV 85.3 80.0 - 100.0 fL   MCH 29.5 26.0 - 34.0 pg   MCHC 34.6 30.0 - 36.0 g/dL   RDW 14.7 11.5 - 15.5 %   Platelets 219 150 - 400 K/uL   nRBC 0.0 0.0 - 0.2 %  Urinalysis, Routine w reflex microscopic Urine, Clean Catch     Status: Abnormal   Collection Time: 10/08/20  3:14 AM  Result Value Ref Range   Color, Urine YELLOW YELLOW   APPearance CLEAR CLEAR   Specific Gravity, Urine 1.010 1.005 - 1.030   pH 5.0 5.0 - 8.0   Glucose, UA >=500 (A) NEGATIVE mg/dL   Hgb urine dipstick SMALL (A) NEGATIVE  Bilirubin Urine NEGATIVE NEGATIVE   Ketones, ur NEGATIVE NEGATIVE mg/dL   Protein, ur 100 (A) NEGATIVE mg/dL   Nitrite NEGATIVE NEGATIVE   Leukocytes,Ua NEGATIVE NEGATIVE   RBC / HPF 0-5 0 - 5 RBC/hpf   WBC, UA 6-10 0 - 5 WBC/hpf   Bacteria, UA RARE (A) NONE SEEN   Mucus PRESENT   Respiratory Panel by RT PCR (Flu A&B, Covid) - Nasopharyngeal Swab     Status: None   Collection Time: 10/08/20  3:14 AM   Specimen: Nasopharyngeal Swab  Result Value Ref Range   SARS Coronavirus 2 by RT PCR NEGATIVE NEGATIVE   Influenza A by PCR NEGATIVE NEGATIVE   Influenza B by PCR NEGATIVE NEGATIVE  Urine rapid drug screen (hosp performed)     Status: None   Collection Time: 10/08/20  3:14 AM  Result Value Ref Range   Opiates NONE DETECTED NONE DETECTED   Cocaine NONE DETECTED NONE DETECTED   Benzodiazepines NONE DETECTED NONE DETECTED   Amphetamines NONE DETECTED NONE DETECTED   Tetrahydrocannabinol NONE DETECTED NONE DETECTED   Barbiturates NONE DETECTED NONE DETECTED  TSH     Status: None   Collection Time: 10/08/20  4:29 AM  Result Value Ref Range   TSH 2.757 0.350 - 4.500 uIU/mL  Uric acid     Status: None   Collection Time: 10/08/20  4:29 AM  Result Value Ref Range   Uric  Acid, Serum 4.8 3.7 - 8.6 mg/dL  Digoxin level     Status: Abnormal   Collection Time: 10/08/20  4:29 AM  Result Value Ref Range   Digoxin Level <0.2 (L) 1.0 - 2.0 ng/mL  Sodium, urine, random     Status: None   Collection Time: 10/08/20  4:29 AM  Result Value Ref Range   Sodium, Ur <10 mmol/L  Osmolality, urine     Status: None   Collection Time: 10/08/20  4:29 AM  Result Value Ref Range   Osmolality, Ur 351 300 - 900 mOsm/kg  Basic metabolic panel     Status: Abnormal   Collection Time: 10/08/20  4:29 AM  Result Value Ref Range   Sodium 115 (LL) 135 - 145 mmol/L   Potassium 4.1 3.5 - 5.1 mmol/L   Chloride 87 (L) 98 - 111 mmol/L   CO2 19 (L) 22 - 32 mmol/L   Glucose, Bld 127 (H) 70 - 99 mg/dL   BUN 18 6 - 20 mg/dL   Creatinine, Ser 1.19 0.61 - 1.24 mg/dL   Calcium 7.9 (L) 8.9 - 10.3 mg/dL   GFR, Estimated >60 >60 mL/min   Anion gap 9 5 - 15  Creatinine, urine, random     Status: None   Collection Time: 10/08/20  4:29 AM  Result Value Ref Range   Creatinine, Urine 78.26 mg/dL  CBG monitoring, ED     Status: Abnormal   Collection Time: 10/08/20  7:49 AM  Result Value Ref Range   Glucose-Capillary 143 (H) 70 - 99 mg/dL  Basic metabolic panel     Status: Abnormal   Collection Time: 10/08/20 10:01 AM  Result Value Ref Range   Sodium 115 (LL) 135 - 145 mmol/L   Potassium 4.4 3.5 - 5.1 mmol/L   Chloride 84 (L) 98 - 111 mmol/L   CO2 22 22 - 32 mmol/L   Glucose, Bld 124 (H) 70 - 99 mg/dL   BUN 23 (H) 6 - 20 mg/dL   Creatinine, Ser 1.32 (H) 0.61 - 1.24 mg/dL  Calcium 7.9 (L) 8.9 - 10.3 mg/dL   GFR, Estimated 58 (L) >60 mL/min   Anion gap 9 5 - 15  Osmolality     Status: Abnormal   Collection Time: 10/08/20 10:01 AM  Result Value Ref Range   Osmolality 254 (L) 275 - 295 mOsm/kg  CBG monitoring, ED     Status: Abnormal   Collection Time: 10/08/20 12:02 PM  Result Value Ref Range   Glucose-Capillary 120 (H) 70 - 99 mg/dL   *Note: Due to a large number of results and/or  encounters for the requested time period, some results have not been displayed. A complete set of results can be found in Results Review.    Recent Results (from the past 240 hour(s))  Respiratory Panel by RT PCR (Flu A&B, Covid) - Nasopharyngeal Swab     Status: None   Collection Time: 10/08/20  3:14 AM   Specimen: Nasopharyngeal Swab  Result Value Ref Range Status   SARS Coronavirus 2 by RT PCR NEGATIVE NEGATIVE Final    Comment: (NOTE) SARS-CoV-2 target nucleic acids are NOT DETECTED.  The SARS-CoV-2 RNA is generally detectable in upper respiratoy specimens during the acute phase of infection. The lowest concentration of SARS-CoV-2 viral copies this assay can detect is 131 copies/mL. A negative result does not preclude SARS-Cov-2 infection and should not be used as the sole basis for treatment or other patient management decisions. A negative result may occur with  improper specimen collection/handling, submission of specimen other than nasopharyngeal swab, presence of viral mutation(s) within the areas targeted by this assay, and inadequate number of viral copies (<131 copies/mL). A negative result must be combined with clinical observations, patient history, and epidemiological information. The expected result is Negative.  Fact Sheet for Patients:  PinkCheek.be  Fact Sheet for Healthcare Providers:  GravelBags.it  This test is no t yet approved or cleared by the [place] FDA and  has been authorized for detection and/or diagnosis of SARS-CoV-2 by FDA under an Emergency Use Authorization (EUA). This EUA will remain  in effect (meaning this test can be used) for the duration of the COVID-19 declaration under Section 564(b)(1) of the Act, 21 U.S.C. section 360bbb-3(b)(1), unless the authorization is terminated or revoked sooner.     Influenza A by PCR NEGATIVE NEGATIVE Final   Influenza B by PCR NEGATIVE NEGATIVE  Final    Comment: (NOTE) The Xpert Xpress SARS-CoV-2/FLU/RSV assay is intended as an aid in  the diagnosis of influenza from Nasopharyngeal swab specimens and  should not be used as a sole basis for treatment. Nasal washings and  aspirates are unacceptable for Xpert Xpress SARS-CoV-2/FLU/RSV  testing.  Fact Sheet for Patients: PinkCheek.be  Fact Sheet for Healthcare Providers: GravelBags.it  This test is not yet approved or cleared by the [place] FDA and  has been authorized for detection and/or diagnosis of SARS-CoV-2 by  FDA under an Emergency Use Authorization (EUA). This EUA will remain  in effect (meaning this test can be used) for the duration of the  Covid-19 declaration under Section 564(b)(1) of the Act, 21  U.S.C. section 360bbb-3(b)(1), unless the authorization is  terminated or revoked. Performed at [facility], [place] [address] [place], [place] [address]      Radiology Studies: MR CERVICAL SPINE WO CONTRAST  Result Date: 10/08/2020 EXAM: MRI CERVICAL SPINE WITHOUT CONTRAST TECHNIQUE: Multiplanar, multisequence MR imaging of the cervical spine was performed. No intravenous contrast was administered. COMPARISON:  Cervical radiographs 09/04/2020, MRI  07/10/2020 FINDINGS: Severely limited examination secondary to patient intolerance and motion. Within this limitation: Alignment: Mild straightening of the normal cervical lordosis. Otherwise, no evidence of substantial subluxation. Vertebrae: Limited evaluation without obvious marrow signal abnormality. Vertebral body heights are maintained. Postsurgical changes of C4 through C6 ACDF. Cord: No obvious cord signal abnormality; however, areas of previously seen cord signal abnormality at C4-C5 and C5-C6 are poorly characterized given motion. Posterior Fossa, vertebral arteries, paraspinal tissues: Nondiagnostic evaluation of the paraspinal tissues.  The visualized fossa is unremarkable. Disc levels: Mild to moderate stenosis is suspected C4-C5 and C5-C6. Otherwise, no evidence of significant canal stenosis. Nondiagnostic evaluation of the foramina secondary to motion. IMPRESSION: Severely limited examination secondary to patient intolerance and motion. Postsurgical changes of C4 through C6 ACDF. Mild to moderate stenosis is suspected at C4-C5 and C5-C6, improved from prior. Nondiagnostic evaluation of the foramina. Electronically Signed   By: Margaretha Sheffield MD   On: 10/08/2020 07:49   DG CHEST PORT 1 VIEW  Result Date: 10/08/2020 CLINICAL DATA:  Shortness of breath. EXAM: PORTABLE CHEST 1 VIEW COMPARISON:  October 05, 2020. FINDINGS: Stable cardiomegaly. No pneumothorax or pleural effusion is noted. Lungs are clear. Bony thorax is unremarkable. IMPRESSION: No active disease. Electronically Signed   By: Marijo Conception M.D.   On: 10/08/2020 08:48   CT Renal Stone Study  Result Date: 10/08/2020 CLINICAL DATA:  59 year old male with abdominal and flank pain. EXAM: CT ABDOMEN AND PELVIS WITHOUT CONTRAST TECHNIQUE: Multidetector CT imaging of the abdomen and pelvis was performed following the standard protocol without IV contrast. COMPARISON:  CTA Chest, Abdomen, and Pelvis 06/21/2020. FINDINGS: Lower chest: Stable cardiomegaly since July. Small layering pleural effusions, that on the right is slightly larger. Negative for lung base pneumonia. Hepatobiliary: Nodular, cirrhotic liver. No discrete liver lesion in the absence of contrast. Negative gallbladder. Pancreas: Negative. Spleen: Diminutive and negative. Adrenals/Urinary Tract: Stable mild adrenal gland thickening. Negative noncontrast kidneys. Circumferential thickening of the urinary bladder (series 2, image 72) appears stable since July. No urinary calculus. Both ureters are decompressed and normal to the bladder. Stomach/Bowel: Redundant large bowel. No dilated large or small bowel loops.  Normal appendix (coronal image 74). Negative terminal ileum. Trace free fluid in both gutters. No free air. Negative stomach. Decompressed duodenum. No convincing bowel inflammation. Vascular/Lymphatic: Aortoiliac calcified atherosclerosis. Vascular patency is not evaluated in the absence of IV contrast. No lymphadenopathy. Reproductive: Small volume of inguinal ligament fluid suspected, otherwise negative. Other: No pelvic free fluid. Musculoskeletal: No acute osseous abnormality identified. IMPRESSION: 1. Cirrhosis with trace ascites and small pleural effusions. Right pleural effusion mildly increased since July. 2. Chronic bladder wall thickening.  Cystitis, UTI not excluded. 3. No urinary calculus or obstructive uropathy. No other acute or inflammatory process in the non-contrast abdomen or pelvis. 4. Cardiomegaly.  Aortic Atherosclerosis (ICD10-I70.0). Electronically Signed   By: Genevie Ann M.D.   On: 10/08/2020 03:35   DG CHEST PORT 1 VIEW  Final Result    MR CERVICAL SPINE WO CONTRAST  Final Result    CT Renal Stone Study  Final Result      Scheduled Meds: . amiodarone  200 mg Oral Daily  . atorvastatin  20 mg Oral q1800  . DULoxetine  60 mg Oral Daily  . enoxaparin (LOVENOX) injection  40 mg Subcutaneous Q24H  . folic acid  1 mg Oral Daily  . furosemide  40 mg Intravenous BID  . gabapentin  900 mg Oral QHS  . [START  ON 10/09/2020] influenza vac split quadrivalent PF  0.5 mL Intramuscular Tomorrow-1000  . insulin aspart  0-9 Units Subcutaneous TID WC  . isosorbide-hydrALAZINE  1 tablet Oral TID  . multivitamin with minerals  1 tablet Oral Daily  . thiamine  100 mg Oral Daily   Or  . thiamine  100 mg Intravenous Daily   PRN Meds: acetaminophen **OR** acetaminophen, LORazepam **OR** LORazepam Continuous Infusions:    LOS: 0 days  Time spent: Greater than 50% of the 35 minute visit was spent in counseling/coordination of care for the patient as laid out in the A&P.   Dwyane Dee, MD [facility] 10/08/2020, 1:46 PM

## 2020-10-08 NOTE — ED Notes (Signed)
Pt IV redresses as it was bleeding.  Pt has been very fidgety and having a difficult time holding still.  Pt is now up to the [facility] to have a BM. Will continue to monitor closely, disconnected pt to go to MRI as they are on the way to get him.
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
## 2020-10-08 NOTE — Assessment & Plan Note (Addendum)
-   I do not appreciate much volume overload; CXR is clear; minimal LE edema; he does have some SOB but this may also have a chronic component - check BNP, 960 - follow up FeUREA (40%) - strict I&O - digoxin stopped last hospitalization 2/2 bradycardia -Diuresis on hold for now, requiring 3% saline

## 2020-10-08 NOTE — Progress Notes (Signed)
Patient arrived to unit at 1805 after receiving report from I. Nicoletta Dress, RN from the ED, noted.

## 2020-10-08 NOTE — Assessment & Plan Note (Signed)
-   continue amiodarone - digoxin stopped last admission due to bradycardia - not on AC due to fall risk with alcohol use

## 2020-10-08 NOTE — Assessment & Plan Note (Addendum)
-   monitor on CIWA; some elevated scores noted yesterday but appear to be downtrending

## 2020-10-08 NOTE — Assessment & Plan Note (Signed)
-   baseline creat ~1.3, currently at baseline - monitor BMP while on hypoNa treatment

## 2020-10-08 NOTE — ED Notes (Signed)
Admitting MD is at the bedside 

## 2020-10-08 NOTE — Assessment & Plan Note (Addendum)
-   evaluated last admission. Felt to be worsened with hypoNa - s/p anterior cervical discectomy and fusion of C4-C6 on 07/24/2020.  Paresthesias were present prior MI continue long-term per neurosurgery -No further evaluation for this per neurosurgery -Continue gabapentin -Follow-up repeat MRI C-spine: Limited by motion.  Overall study appears improved, still underlying mild to moderate stenosis at C4-C5 and C5-C6; no significant canal stenosis

## 2020-10-08 NOTE — Assessment & Plan Note (Addendum)
-  Differential includes gastroenteritis versus alcohol withdrawal -Follow-up stool studies if able to obtain; so far diarrhea has improved

## 2020-10-08 NOTE — Assessment & Plan Note (Signed)
-   continue current regimen; adjust as needed

## 2020-10-08 NOTE — ED Notes (Signed)
Patient transported to CT, will swab for covid and start IV as soon as pt  Returns.

## 2020-10-08 NOTE — ED Notes (Signed)
Waiting for Na tablet from pharmacy.  Gave pt [place] soup (980mg  Na). Advised pt that he has urine sample ordered and MD would like a I&O cath if he can not make urine sample
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
## 2020-10-08 NOTE — ED Notes (Signed)
Date and time results received: 10/08/20 5:26 AM Test: Na Critical Value: 115  Name of Provider Notified: Hal Hope   Order to I&O cath to expedite urine sample and order for po NaCl tablet

## 2020-10-08 NOTE — Assessment & Plan Note (Addendum)
-   no obvious sxms yet - Na initially 117 on admission - Na downtrended with lasix/fluid restriction; changed to 3%NS on 10/19 - given GI losses concern for volume depletion; also on diuretic at home - nephrology consulted, appreciate assistance as well - his serum osmo is 254 - trend BMP q4h

## 2020-10-09 ENCOUNTER — Encounter (HOSPITAL_COMMUNITY): Payer: Medicaid Other

## 2020-10-09 ENCOUNTER — Telehealth (HOSPITAL_COMMUNITY): Payer: Self-pay

## 2020-10-09 ENCOUNTER — Other Ambulatory Visit: Payer: Self-pay

## 2020-10-09 LAB — BASIC METABOLIC PANEL
Anion gap: 7 (ref 5–15)
Anion gap: 5 (ref 5–15)
Anion gap: 6 (ref 5–15)
Anion gap: 3 — ABNORMAL LOW (ref 5–15)
Anion gap: 4 — ABNORMAL LOW (ref 5–15)
BUN: 25 mg/dL — ABNORMAL HIGH (ref 6–20)
BUN: 28 mg/dL — ABNORMAL HIGH (ref 6–20)
BUN: 29 mg/dL — ABNORMAL HIGH (ref 6–20)
BUN: 30 mg/dL — ABNORMAL HIGH (ref 6–20)
BUN: 32 mg/dL — ABNORMAL HIGH (ref 6–20)
CO2: 22 mmol/L (ref 22–32)
CO2: 24 mmol/L (ref 22–32)
CO2: 20 mmol/L — ABNORMAL LOW (ref 22–32)
CO2: 21 mmol/L — ABNORMAL LOW (ref 22–32)
CO2: 20 mmol/L — ABNORMAL LOW (ref 22–32)
Calcium: 7.7 mg/dL — ABNORMAL LOW (ref 8.9–10.3)
Calcium: 7.9 mg/dL — ABNORMAL LOW (ref 8.9–10.3)
Calcium: 7.6 mg/dL — ABNORMAL LOW (ref 8.9–10.3)
Calcium: 7.7 mg/dL — ABNORMAL LOW (ref 8.9–10.3)
Calcium: 7.8 mg/dL — ABNORMAL LOW (ref 8.9–10.3)
Chloride: 86 mmol/L — ABNORMAL LOW (ref 98–111)
Chloride: 86 mmol/L — ABNORMAL LOW (ref 98–111)
Chloride: 86 mmol/L — ABNORMAL LOW (ref 98–111)
Chloride: 90 mmol/L — ABNORMAL LOW (ref 98–111)
Chloride: 90 mmol/L — ABNORMAL LOW (ref 98–111)
Creatinine, Ser: 1.49 mg/dL — ABNORMAL HIGH (ref 0.61–1.24)
Creatinine, Ser: 1.54 mg/dL — ABNORMAL HIGH (ref 0.61–1.24)
Creatinine, Ser: 1.46 mg/dL — ABNORMAL HIGH (ref 0.61–1.24)
Creatinine, Ser: 1.54 mg/dL — ABNORMAL HIGH (ref 0.61–1.24)
Creatinine, Ser: 1.53 mg/dL — ABNORMAL HIGH (ref 0.61–1.24)
GFR, Estimated: 50 mL/min — ABNORMAL LOW (ref >60)
GFR, Estimated: 48 mL/min — ABNORMAL LOW (ref >60)
GFR, Estimated: 52 mL/min — ABNORMAL LOW (ref >60)
GFR, Estimated: 48 mL/min — ABNORMAL LOW (ref >60)
GFR, Estimated: 49 mL/min — ABNORMAL LOW (ref >60)
Glucose, Bld: 122 mg/dL — ABNORMAL HIGH (ref 70–99)
Glucose, Bld: 127 mg/dL — ABNORMAL HIGH (ref 70–99)
Glucose, Bld: 125 mg/dL — ABNORMAL HIGH (ref 70–99)
Glucose, Bld: 113 mg/dL — ABNORMAL HIGH (ref 70–99)
Glucose, Bld: 107 mg/dL — ABNORMAL HIGH (ref 70–99)
Potassium: 4.4 mmol/L (ref 3.5–5.1)
Potassium: 4.7 mmol/L (ref 3.5–5.1)
Potassium: 4.3 mmol/L (ref 3.5–5.1)
Potassium: 4.5 mmol/L (ref 3.5–5.1)
Potassium: 4.4 mmol/L (ref 3.5–5.1)
Sodium: 115 mmol/L — CL (ref 135–145)
Sodium: 115 mmol/L — CL (ref 135–145)
Sodium: 112 mmol/L — CL (ref 135–145)
Sodium: 114 mmol/L — CL (ref 135–145)
Sodium: 114 mmol/L — CL (ref 135–145)

## 2020-10-09 LAB — CBC WITH DIFFERENTIAL/PLATELET
Abs Immature Granulocytes: 0.01 10*3/uL (ref 0.00–0.07)
Basophils Absolute: 0 10*3/uL (ref 0.0–0.1)
Basophils Relative: 1 %
Eosinophils Absolute: 0.1 10*3/uL (ref 0.0–0.5)
Eosinophils Relative: 2 %
HCT: 30.6 % — ABNORMAL LOW (ref 39.0–52.0)
Hemoglobin: 10.7 g/dL — ABNORMAL LOW (ref 13.0–17.0)
Immature Granulocytes: 0 %
Lymphocytes Relative: 7 %
Lymphs Abs: 0.3 10*3/uL — ABNORMAL LOW (ref 0.7–4.0)
MCH: 29.3 pg (ref 26.0–34.0)
MCHC: 35 g/dL (ref 30.0–36.0)
MCV: 83.8 fL (ref 80.0–100.0)
Monocytes Absolute: 1 10*3/uL (ref 0.1–1.0)
Monocytes Relative: 23 %
Neutro Abs: 2.7 10*3/uL (ref 1.7–7.7)
Neutrophils Relative %: 67 %
Platelets: 188 10*3/uL (ref 150–400)
RBC: 3.65 MIL/uL — ABNORMAL LOW (ref 4.22–5.81)
RDW: 14.6 % (ref 11.5–15.5)
WBC: 4.1 10*3/uL (ref 4.0–10.5)
nRBC: 0 % (ref 0.0–0.2)

## 2020-10-09 LAB — GLUCOSE, CAPILLARY
Glucose-Capillary: 134 mg/dL — ABNORMAL HIGH (ref 70–99)
Glucose-Capillary: 152 mg/dL — ABNORMAL HIGH (ref 70–99)
Glucose-Capillary: 134 mg/dL — ABNORMAL HIGH (ref 70–99)
Glucose-Capillary: 139 mg/dL — ABNORMAL HIGH (ref 70–99)

## 2020-10-09 LAB — MAGNESIUM: Magnesium: 2.1 mg/dL (ref 1.7–2.4)

## 2020-10-09 LAB — UREA NITROGEN, URINE: Urea Nitrogen, Ur: 469 mg/dL

## 2020-10-09 MED ORDER — ONDANSETRON HCL 4 MG/2ML IJ SOLN
4.0000 mg | Freq: Four times a day (QID) | INTRAMUSCULAR | Status: DC | PRN
  Administered 2020-10-09: 4 mg via INTRAVENOUS
  Filled 2020-10-09: qty 2

## 2020-10-09 MED ORDER — SODIUM CHLORIDE 3 % IV SOLN
INTRAVENOUS | Status: DC
  Filled 2020-10-09 (×2): qty 500

## 2020-10-09 MED ORDER — ALUM & MAG HYDROXIDE-SIMETH 200-200-20 MG/5ML PO SUSP: 30.0000 mL | ORAL | Status: DC | PRN

## 2020-10-09 MED ORDER — PANTOPRAZOLE SODIUM 40 MG IV SOLR
40.0000 mg | Freq: Every day | INTRAVENOUS | Status: DC
  Administered 2020-10-09 – 2020-10-12 (×4): 40 mg via INTRAVENOUS
  Filled 2020-10-09 (×4): qty 40

## 2020-10-09 MED ORDER — MORPHINE SULFATE (PF) 2 MG/ML IV SOLN
2.0000 mg | INTRAVENOUS | Status: DC | PRN
  Administered 2020-10-09 – 2020-10-12 (×17): 2 mg via INTRAVENOUS
  Filled 2020-10-09 (×17): qty 1

## 2020-10-09 NOTE — Progress Notes (Signed)
PROGRESS NOTE    Brad [name]   IOX:[id number]  DOB: [birth year]  DOA: 10/08/2020     1  PCP: Charlott Rakes, MD  CC: N/V/D  Hospital Course: Brad [name] is a 59 y.o. male with medical history significant of chronic systolic and diastolic congestive heart failure, permanent atrial fibrillation, alcoholic cirrhosis, CKD stage III who is admitted with nausea, vomiting, and diarrhea. He continues to drink outpatient and has had multiple admissions for recurrent hyponatremia.  He had surgery for C4-C6 disease and underwent surgical fixation on 08/24/2020.  He has intermittent paresthesias but was evaluated by neurosurgery last hospitalization with no further evaluation recommended.  His Na on admission was 117 which further downtrended to 115. Nephrology was again consulted on admission; he was recommended to restart on IV lasix with fluid restriction.  Sodium levels further down trended, and on 10/09/2020 he was initiated on 3% saline.    Interval History:  No events overnight.  He is mostly complaining of pain in his neck and abdomen this morning.  He states he is used to getting some pain medicine when in the hospital and was requesting that this morning.  He also is endorsing what sounds like some GERD. He has no confusion.  Alcohol withdrawal tremors seem to be improved.  He did require some Ativan for treatment. We again talked about alcohol cessation and he states he does want to quit drinking which he has said in the past.  Old records reviewed in assessment of this patient  ROS: Constitutional: positive for fatigue and malaise, negative for chills and fevers, Respiratory: negative for cough, Cardiovascular: negative for chest pain and Gastrointestinal: negative for abdominal pain  Assessment & Plan: * Hyponatremia - no obvious sxms yet - Na initially 117 on admission - Na downtrended with lasix/fluid restriction; changed to 3%NS on 10/19 - given GI losses concern for  volume depletion; also on diuretic at home - nephrology consulted, appreciate assistance as well - his serum osmo is 254 - trend BMP q4h  Nausea vomiting and diarrhea -Differential includes gastroenteritis versus alcohol withdrawal -Follow-up stool studies if able to obtain; so far diarrhea has improved   Chronic combined systolic and diastolic CHF (congestive heart failure) ([place]) - I do not appreciate much volume overload; CXR is clear; minimal LE edema; he does have some SOB but this may also have a chronic component - check BNP, 960 - follow up [place] (40%) - strict I&O - digoxin stopped last hospitalization 2/2 bradycardia -Diuresis on hold for now, requiring 3% saline  Paresthesias - evaluated last admission. Felt to be worsened with hypoNa - s/p anterior cervical discectomy and fusion of C4-C6 on 07/24/2020.  Paresthesias were present prior MI continue long-term per neurosurgery -No further evaluation for this per neurosurgery -Continue gabapentin -Follow-up repeat MRI C-spine: Limited by motion.  Overall study appears improved, still underlying mild to moderate stenosis at C4-C5 and C5-C6; no significant canal stenosis  Alcohol dependence syndrome (HCC) - monitor on CIWA; some elevated scores noted yesterday but appear to be downtrending  Permanent atrial fibrillation - continue amiodarone - digoxin stopped last admission due to bradycardia - not on AC due to fall risk with alcohol use   Asymptomatic bacteriuria-resolved as of 10/09/2020 -Initially on Rocephin on admission due to possible UTI.  I do not appreciate any urinary symptoms and will hold off on further antibiotics at this time -Discontinue Rocephin  CAD (coronary artery disease), native coronary artery - continue asa, statin  CKD (chronic kidney  disease) stage 3, GFR 30-59 ml/min (HCC) - baseline creat ~1.3, currently at baseline - monitor BMP while on hypoNa treatment   DM type 2 (diabetes mellitus, type 2)  (HCC) -Continue SSI and CBG monitoring  Essential hypertension - continue current regimen; adjust as needed  Chronic hepatitis C with cirrhosis ([place]) - no s/s decompensation at this time  Polysubstance abuse ([place]) - history noted - monitor on CIWA as well    Antimicrobials: Rocephin on admission, not continued  DVT prophylaxis: Lovenox Code Status: Full Family Communication: None present Disposition Plan: Status is: Inpatient  Remains inpatient appropriate because:Persistent severe electrolyte disturbances, Unsafe d/c plan, IV treatments appropriate due to intensity of illness or inability to take PO and Inpatient level of care appropriate due to severity of illness   Dispo: The patient is from: Home              Anticipated d/c is to: Home              Anticipated d/c date is: 3 days              Patient currently is not medically stable to d/c.  Objective: Blood pressure 114/84, pulse 68, temperature 97.6 F (36.4 C), temperature source Oral, resp. rate 20, SpO2 98 %.  Examination: General appearance: alert, cooperative, no distress and No further muscle jerking appreciated this morning Head: Normocephalic, without obvious abnormality, atraumatic Eyes: EOMI Lungs: clear to auscultation bilaterally Heart: regular rate and rhythm and S1, S2 normal Abdomen: normal findings: bowel sounds normal and soft, non-tender Extremities: Trace lower extremity edema Skin: mobility and turgor normal Neurologic: Grossly normal  Consultants:   Nephrology  Procedures:   None  Data Reviewed: I have personally reviewed following labs and imaging studies Results for orders placed or performed during the hospital encounter of 10/08/20 (from the past 24 hour(s))  CBG monitoring, ED     Status: Abnormal   Collection Time: 10/08/20  5:38 PM  Result Value Ref Range   Glucose-Capillary 131 (H) 70 - 99 mg/dL  Basic metabolic panel     Status: Abnormal   Collection Time: 10/08/20   5:42 PM  Result Value Ref Range   Sodium 114 (LL) 135 - 145 mmol/L   Potassium 4.3 3.5 - 5.1 mmol/L   Chloride 86 (L) 98 - 111 mmol/L   CO2 23 22 - 32 mmol/L   Glucose, Bld 131 (H) 70 - 99 mg/dL   BUN 26 (H) 6 - 20 mg/dL   Creatinine, Ser 1.34 (H) 0.61 - 1.24 mg/dL   Calcium 7.4 (L) 8.9 - 10.3 mg/dL   GFR, Estimated 57 (L) >60 mL/min   Anion gap 5 5 - 15  Glucose, capillary     Status: Abnormal   Collection Time: 10/08/20 10:31 PM  Result Value Ref Range   Glucose-Capillary 180 (H) 70 - 99 mg/dL  Basic metabolic panel     Status: Abnormal   Collection Time: 10/09/20 12:39 AM  Result Value Ref Range   Sodium 115 (LL) 135 - 145 mmol/L   Potassium 4.4 3.5 - 5.1 mmol/L   Chloride 86 (L) 98 - 111 mmol/L   CO2 22 22 - 32 mmol/L   Glucose, Bld 122 (H) 70 - 99 mg/dL   BUN 25 (H) 6 - 20 mg/dL   Creatinine, Ser 1.49 (H) 0.61 - 1.24 mg/dL   Calcium 7.7 (L) 8.9 - 10.3 mg/dL   GFR, Estimated 50 (L) >60 mL/min   Anion gap  7 5 - 15  CBC with Differential/Platelet     Status: Abnormal   Collection Time: 10/09/20  4:22 AM  Result Value Ref Range   WBC 4.1 4.0 - 10.5 K/uL   RBC 3.65 (L) 4.22 - 5.81 MIL/uL   Hemoglobin 10.7 (L) 13.0 - 17.0 g/dL   HCT 30.6 (L) 39 - 52 %   MCV 83.8 80.0 - 100.0 fL   MCH 29.3 26.0 - 34.0 pg   MCHC 35.0 30.0 - 36.0 g/dL   RDW 14.6 11.5 - 15.5 %   Platelets 188 150 - 400 K/uL   nRBC 0.0 0.0 - 0.2 %   Neutrophils Relative % 67 %   Neutro Abs 2.7 1.7 - 7.7 K/uL   Lymphocytes Relative 7 %   Lymphs Abs 0.3 (L) 0.7 - 4.0 K/uL   Monocytes Relative 23 %   Monocytes Absolute 1.0 0.1 - 1.0 K/uL   Eosinophils Relative 2 %   Eosinophils Absolute 0.1 0.0 - 0.5 K/uL   Basophils Relative 1 %   Basophils Absolute 0.0 0.0 - 0.1 K/uL   Immature Granulocytes 0 %   Abs Immature Granulocytes 0.01 0.00 - 0.07 K/uL  Magnesium     Status: None   Collection Time: 10/09/20  4:22 AM  Result Value Ref Range   Magnesium 2.1 1.7 - 2.4 mg/dL  Basic metabolic panel     Status:  Abnormal   Collection Time: 10/09/20  4:22 AM  Result Value Ref Range   Sodium 115 (LL) 135 - 145 mmol/L   Potassium 4.7 3.5 - 5.1 mmol/L   Chloride 86 (L) 98 - 111 mmol/L   CO2 24 22 - 32 mmol/L   Glucose, Bld 127 (H) 70 - 99 mg/dL   BUN 28 (H) 6 - 20 mg/dL   Creatinine, Ser 1.54 (H) 0.61 - 1.24 mg/dL   Calcium 7.9 (L) 8.9 - 10.3 mg/dL   GFR, Estimated 48 (L) >60 mL/min   Anion gap 5 5 - 15  Glucose, capillary     Status: Abnormal   Collection Time: 10/09/20  7:34 AM  Result Value Ref Range   Glucose-Capillary 134 (H) 70 - 99 mg/dL  Basic metabolic panel     Status: Abnormal   Collection Time: 10/09/20  9:12 AM  Result Value Ref Range   Sodium 112 (LL) 135 - 145 mmol/L   Potassium 4.3 3.5 - 5.1 mmol/L   Chloride 86 (L) 98 - 111 mmol/L   CO2 20 (L) 22 - 32 mmol/L   Glucose, Bld 125 (H) 70 - 99 mg/dL   BUN 29 (H) 6 - 20 mg/dL   Creatinine, Ser 1.46 (H) 0.61 - 1.24 mg/dL   Calcium 7.6 (L) 8.9 - 10.3 mg/dL   GFR, Estimated 52 (L) >60 mL/min   Anion gap 6 5 - 15  Glucose, capillary     Status: Abnormal   Collection Time: 10/09/20 11:31 AM  Result Value Ref Range   Glucose-Capillary 152 (H) 70 - 99 mg/dL  Basic metabolic panel     Status: Abnormal   Collection Time: 10/09/20  1:15 PM  Result Value Ref Range   Sodium 114 (LL) 135 - 145 mmol/L   Potassium 4.5 3.5 - 5.1 mmol/L   Chloride 90 (L) 98 - 111 mmol/L   CO2 21 (L) 22 - 32 mmol/L   Glucose, Bld 113 (H) 70 - 99 mg/dL   BUN 30 (H) 6 - 20 mg/dL   Creatinine, Ser 1.54 (H)  0.61 - 1.24 mg/dL   Calcium 7.7 (L) 8.9 - 10.3 mg/dL   GFR, Estimated 48 (L) >60 mL/min   Anion gap 3 (L) 5 - 15   *Note: Due to a large number of results and/or encounters for the requested time period, some results have not been displayed. A complete set of results can be found in Results Review.    Recent Results (from the past 240 hour(s))  Respiratory Panel by RT PCR (Flu A&B, Covid) - Nasopharyngeal Swab     Status: None   Collection Time:  10/08/20  3:14 AM   Specimen: Nasopharyngeal Swab  Result Value Ref Range Status   SARS Coronavirus 2 by RT PCR NEGATIVE NEGATIVE Final    Comment: (NOTE) SARS-CoV-2 target nucleic acids are NOT DETECTED.  The SARS-CoV-2 RNA is generally detectable in upper respiratoy specimens during the acute phase of infection. The lowest concentration of SARS-CoV-2 viral copies this assay can detect is 131 copies/mL. A negative result does not preclude SARS-Cov-2 infection and should not be used as the sole basis for treatment or other patient management decisions. A negative result may occur with  improper specimen collection/handling, submission of specimen other than nasopharyngeal swab, presence of viral mutation(s) within the areas targeted by this assay, and inadequate number of viral copies (<131 copies/mL). A negative result must be combined with clinical observations, patient history, and epidemiological information. The expected result is Negative.  Fact Sheet for Patients:  PinkCheek.be  Fact Sheet for Healthcare Providers:  GravelBags.it  This test is no t yet approved or cleared by the [place] FDA and  has been authorized for detection and/or diagnosis of SARS-CoV-2 by FDA under an Emergency Use Authorization (EUA). This EUA will remain  in effect (meaning this test can be used) for the duration of the COVID-19 declaration under Section 564(b)(1) of the Act, 21 U.S.C. section 360bbb-3(b)(1), unless the authorization is terminated or revoked sooner.     Influenza A by PCR NEGATIVE NEGATIVE Final   Influenza B by PCR NEGATIVE NEGATIVE Final    Comment: (NOTE) The Xpert Xpress SARS-CoV-2/FLU/RSV assay is intended as an aid in  the diagnosis of influenza from Nasopharyngeal swab specimens and  should not be used as a sole basis for treatment. Nasal washings and  aspirates are unacceptable for Xpert Xpress  SARS-CoV-2/FLU/RSV  testing.  Fact Sheet for Patients: PinkCheek.be  Fact Sheet for Healthcare Providers: GravelBags.it  This test is not yet approved or cleared by the [place] FDA and  has been authorized for detection and/or diagnosis of SARS-CoV-2 by  FDA under an Emergency Use Authorization (EUA). This EUA will remain  in effect (meaning this test can be used) for the duration of the  Covid-19 declaration under Section 564(b)(1) of the Act, 21  U.S.C. section 360bbb-3(b)(1), unless the authorization is  terminated or revoked. Performed at [facility], [place] [address], [place], [place] [address]      Radiology Studies: MR CERVICAL SPINE WO CONTRAST  Result Date: 10/08/2020 EXAM: MRI CERVICAL SPINE WITHOUT CONTRAST TECHNIQUE: Multiplanar, multisequence MR imaging of the cervical spine was performed. No intravenous contrast was administered. COMPARISON:  Cervical radiographs 09/04/2020, MRI 07/10/2020 FINDINGS: Severely limited examination secondary to patient intolerance and motion. Within this limitation: Alignment: Mild straightening of the normal cervical lordosis. Otherwise, no evidence of substantial subluxation. Vertebrae: Limited evaluation without obvious marrow signal abnormality. Vertebral body heights are maintained. Postsurgical changes of C4 through C6 ACDF. Cord: No obvious cord signal  abnormality; however, areas of previously seen cord signal abnormality at C4-C5 and C5-C6 are poorly characterized given motion. Posterior Fossa, vertebral arteries, paraspinal tissues: Nondiagnostic evaluation of the paraspinal tissues. The visualized fossa is unremarkable. Disc levels: Mild to moderate stenosis is suspected C4-C5 and C5-C6. Otherwise, no evidence of significant canal stenosis. Nondiagnostic evaluation of the foramina secondary to motion. IMPRESSION: Severely limited examination secondary to  patient intolerance and motion. Postsurgical changes of C4 through C6 ACDF. Mild to moderate stenosis is suspected at C4-C5 and C5-C6, improved from prior. Nondiagnostic evaluation of the foramina. Electronically Signed   By: Margaretha Sheffield MD   On: 10/08/2020 07:49   DG CHEST PORT 1 VIEW  Result Date: 10/08/2020 CLINICAL DATA:  Shortness of breath. EXAM: PORTABLE CHEST 1 VIEW COMPARISON:  October 05, 2020. FINDINGS: Stable cardiomegaly. No pneumothorax or pleural effusion is noted. Lungs are clear. Bony thorax is unremarkable. IMPRESSION: No active disease. Electronically Signed   By: Marijo Conception M.D.   On: 10/08/2020 08:48   CT Renal Stone Study  Result Date: 10/08/2020 CLINICAL DATA:  59 year old male with abdominal and flank pain. EXAM: CT ABDOMEN AND PELVIS WITHOUT CONTRAST TECHNIQUE: Multidetector CT imaging of the abdomen and pelvis was performed following the standard protocol without IV contrast. COMPARISON:  CTA Chest, Abdomen, and Pelvis 06/21/2020. FINDINGS: Lower chest: Stable cardiomegaly since July. Small layering pleural effusions, that on the right is slightly larger. Negative for lung base pneumonia. Hepatobiliary: Nodular, cirrhotic liver. No discrete liver lesion in the absence of contrast. Negative gallbladder. Pancreas: Negative. Spleen: Diminutive and negative. Adrenals/Urinary Tract: Stable mild adrenal gland thickening. Negative noncontrast kidneys. Circumferential thickening of the urinary bladder (series 2, image 72) appears stable since July. No urinary calculus. Both ureters are decompressed and normal to the bladder. Stomach/Bowel: Redundant large bowel. No dilated large or small bowel loops. Normal appendix (coronal image 74). Negative terminal ileum. Trace free fluid in both gutters. No free air. Negative stomach. Decompressed duodenum. No convincing bowel inflammation. Vascular/Lymphatic: Aortoiliac calcified atherosclerosis. Vascular patency is not evaluated in the  absence of IV contrast. No lymphadenopathy. Reproductive: Small volume of inguinal ligament fluid suspected, otherwise negative. Other: No pelvic free fluid. Musculoskeletal: No acute osseous abnormality identified. IMPRESSION: 1. Cirrhosis with trace ascites and small pleural effusions. Right pleural effusion mildly increased since July. 2. Chronic bladder wall thickening.  Cystitis, UTI not excluded. 3. No urinary calculus or obstructive uropathy. No other acute or inflammatory process in the non-contrast abdomen or pelvis. 4. Cardiomegaly.  Aortic Atherosclerosis (ICD10-I70.0). Electronically Signed   By: Genevie Ann M.D.   On: 10/08/2020 03:35   DG CHEST PORT 1 VIEW  Final Result    MR CERVICAL SPINE WO CONTRAST  Final Result    CT Renal Stone Study  Final Result      Scheduled Meds: . amiodarone  200 mg Oral Daily  . atorvastatin  20 mg Oral q1800  . DULoxetine  60 mg Oral Daily  . enoxaparin (LOVENOX) injection  40 mg Subcutaneous Q24H  . folic acid  1 mg Oral Daily  . gabapentin  900 mg Oral QHS  . insulin aspart  0-9 Units Subcutaneous TID WC  . isosorbide-hydrALAZINE  1 tablet Oral TID  . multivitamin with minerals  1 tablet Oral Daily  . pantoprazole (PROTONIX) IV  40 mg Intravenous Daily  . thiamine  100 mg Oral Daily   Or  . thiamine  100 mg Intravenous Daily   PRN Meds: acetaminophen **OR** acetaminophen,  alum & mag hydroxide-simeth, LORazepam **OR** LORazepam, morphine injection, ondansetron (ZOFRAN) IV Continuous Infusions: . sodium chloride (hypertonic) 30 mL/hr at 10/09/20 0833      LOS: 1 day  Time spent: Greater than 50% of the 35 minute visit was spent in counseling/coordination of care for the patient as laid out in the A&P.   Dwyane Dee, MD [facility] 10/09/2020, 2:38 PM

## 2020-10-09 NOTE — Telephone Encounter (Signed)
Spoke to Brad [name] who confirmed he is admitted at [facility] due to low sodium levels. Brad [name] reports he needs help to stop drinking alcohol. He requested assistance with being placed in an outpatient facility. I forwarded same information to [employer]. I advised Brad [name] we would keep him updated. He was appreciative of same. Call complete.

## 2020-10-09 NOTE — Assessment & Plan Note (Signed)
-   Continue SSI and CBG monitoring ?

## 2020-10-09 NOTE — TOC Progression Note (Signed)
Transition of Care [facility]) - Progression Note    Patient Details  Name: Brad [name] MRN: [id number] Date of Birth: 1961-06-13  Transition of [facility]) CM/SW Contact  Purcell Mouton, RN Phone Number: 10/09/2020, 9:38 AM  Clinical Narrative:    Pt from home. TOC will continue to follow for discharge.    Expected Discharge Plan: Home/Self Care Barriers to Discharge: No Barriers Identified  Expected Discharge Plan and Services Expected Discharge Plan: Home/Self Care       Living arrangements for the past 2 months: Single Family Home                                       Social Determinants of Health (SDOH) Interventions    Readmission Risk Interventions Readmission Risk Prevention Plan 08/17/2020 07/23/2020 06/13/2020  Transportation Screening Complete Complete Complete  PCP or Specialist Appt within 5-7 Days - - -  Home Care Screening - - -  Medication Review (RN CM) - - -  Medication Review (RN Transport planner) - Referral to Pharmacy Complete  PCP or Specialist appointment within 3-5 days of discharge Complete Complete Complete  PCP/Specialist Appt Not Complete comments - - -  [place] or Home Care Consult Complete Complete Complete  HRI or Home Care Consult Pt Refusal Comments - - -  SW Recovery Care/Counseling Consult Complete Complete Complete  Palliative Care Screening Not Applicable Not Applicable Not Applicable  Skilled Nursing Facility Not Applicable Complete Complete  Some recent data might be hidden

## 2020-10-09 NOTE — Progress Notes (Signed)
RN received call from lab stating pt had critical sodium level of 112. RN paged Dr. Sabino Gasser @ [phone] who returned page at [phone] to take critical sodium report. RN will continue to monitor.

## 2020-10-09 NOTE — Progress Notes (Signed)
[facility] Progress Note  Subjective: no c/o, Na+ 112 > 114 after 4 h of 3% this am.  Pt fully oriented  Vitals:   10/09/20 0240 10/09/20 0537 10/09/20 0807 10/09/20 1343  BP: 116/72 132/71 132/86 114/84  Pulse: 63 64 74 68  Resp: _0 Temp: 97.7 F (36.5 C) 98 F (36.7 C) (!) 97.5 F (36.4 C) 97.6 F (36.4 C)  TempSrc: Oral Oral Oral Oral  SpO2: 98% 97% 96% 98%    Exam: alert, nad   no jvd  Chest cta bilat  Cor reg no RG  Abd soft ntnd no ascites   Ext no LE edema   Alert, NF, ox3    Home meds:  - amiodarone 200/ lipitor 20/ asa 81 mg  - lasix 40 bid/ bidil 20-37.5 bid/ Kdur 20  - dapagliflozin 5 qd  - gabapentin 600 am + 900 pm/ cymbalta 60 qd  - prn's/ vitamins/ supplements    UA 10/18 > 0-5 rbc, 6-10 wbc, prot 100   UNa <10,  UCr 78  , Uosm  351    BP's wnl to high 150/96 e.g.    HR 70's low, RR 19  Temp 98  Assessment/ Plan: 1. Hyponatremia - a recurrent diagnosis, has been admitted numerous times for this in the past and has been treated several different ways. Hx syst CHF and beer drinker also. Did not respond well to IV lasix, so we have started 3% saline this am.  Will cont at 30 cc/hr for now, goal to get Na+ up around 120 by tomorrow am.   2. Acute renal insuff - mild, creat 1.32, usual baseline for him is 1.18- 1.40 eGFR > 60.  3. N/V/D - not sure cause 4. Combined diast/ syst CHF - EF 30%. Mild leg edema, no pulm edema. Holding diuretics today.  5. Atrial fib - on amio 6. CAD - hx of stent, on asa/ statin 7. HTN - getting home bidil, no other bp lowering meds 8. ETOH abuse 9. H/o drug abuse      Brad [name] 10/09/2020, 3:31 PM   Recent Labs  Lab 10/08/20 0141 10/08/20 0429 10/09/20 0422 10/09/20 0422 10/09/20 0912 10/09/20 1315  K 4.0   < > 4.7   < > 4.3 4.5  BUN 17   < > 28*   < > 29* 30*  CREATININE 1.16   < > 1.54*   < > 1.46* 1.54*  CALCIUM 8.2*   < > 7.9*   < > 7.6* 7.7*  HGB 12.0*  --  10.7*  --    --   --    < > = values in this interval not displayed.   Inpatient medications: . amiodarone  200 mg Oral Daily  . atorvastatin  20 mg Oral q1800  . DULoxetine  60 mg Oral Daily  . enoxaparin (LOVENOX) injection  40 mg Subcutaneous Q24H  . folic acid  1 mg Oral Daily  . gabapentin  900 mg Oral QHS  . insulin aspart  0-9 Units Subcutaneous TID WC  . isosorbide-hydrALAZINE  1 tablet Oral TID  . multivitamin with minerals  1 tablet Oral Daily  . pantoprazole (PROTONIX) IV  40 mg Intravenous Daily  . thiamine  100 mg Oral Daily   Or  . thiamine  100 mg Intravenous Daily   . sodium chloride (hypertonic) 30 mL/hr at 10/09/20 5462   acetaminophen **OR** acetaminophen, alum & mag hydroxide-simeth, LORazepam **OR** LORazepam,  morphine injection, ondansetron (ZOFRAN) IV

## 2020-10-10 LAB — CBC WITH DIFFERENTIAL/PLATELET
Abs Immature Granulocytes: 0.01 10*3/uL (ref 0.00–0.07)
Basophils Absolute: 0 10*3/uL (ref 0.0–0.1)
Basophils Relative: 1 %
Eosinophils Absolute: 0.1 10*3/uL (ref 0.0–0.5)
Eosinophils Relative: 3 %
HCT: 29.4 % — ABNORMAL LOW (ref 39.0–52.0)
Hemoglobin: 10.2 g/dL — ABNORMAL LOW (ref 13.0–17.0)
Immature Granulocytes: 0 %
Lymphocytes Relative: 7 %
Lymphs Abs: 0.2 10*3/uL — ABNORMAL LOW (ref 0.7–4.0)
MCH: 29.2 pg (ref 26.0–34.0)
MCHC: 34.7 g/dL (ref 30.0–36.0)
MCV: 84.2 fL (ref 80.0–100.0)
Monocytes Absolute: 0.8 10*3/uL (ref 0.1–1.0)
Monocytes Relative: 24 %
Neutro Abs: 2.2 10*3/uL (ref 1.7–7.7)
Neutrophils Relative %: 65 %
Platelets: 167 10*3/uL (ref 150–400)
RBC: 3.49 MIL/uL — ABNORMAL LOW (ref 4.22–5.81)
RDW: 14.6 % (ref 11.5–15.5)
WBC: 3.4 10*3/uL — ABNORMAL LOW (ref 4.0–10.5)
nRBC: 0 % (ref 0.0–0.2)

## 2020-10-10 LAB — BASIC METABOLIC PANEL
Anion gap: 8 (ref 5–15)
Anion gap: 4 — ABNORMAL LOW (ref 5–15)
BUN: 28 mg/dL — ABNORMAL HIGH (ref 6–20)
BUN: 29 mg/dL — ABNORMAL HIGH (ref 6–20)
CO2: 20 mmol/L — ABNORMAL LOW (ref 22–32)
CO2: 20 mmol/L — ABNORMAL LOW (ref 22–32)
Calcium: 7.7 mg/dL — ABNORMAL LOW (ref 8.9–10.3)
Calcium: 7.4 mg/dL — ABNORMAL LOW (ref 8.9–10.3)
Chloride: 88 mmol/L — ABNORMAL LOW (ref 98–111)
Chloride: 91 mmol/L — ABNORMAL LOW (ref 98–111)
Creatinine, Ser: 1.56 mg/dL — ABNORMAL HIGH (ref 0.61–1.24)
Creatinine, Ser: 1.47 mg/dL — ABNORMAL HIGH (ref 0.61–1.24)
GFR, Estimated: 48 mL/min — ABNORMAL LOW (ref >60)
GFR, Estimated: 51 mL/min — ABNORMAL LOW (ref >60)
Glucose, Bld: 161 mg/dL — ABNORMAL HIGH (ref 70–99)
Glucose, Bld: 135 mg/dL — ABNORMAL HIGH (ref 70–99)
Potassium: 4.2 mmol/L (ref 3.5–5.1)
Potassium: 4.2 mmol/L (ref 3.5–5.1)
Sodium: 116 mmol/L — CL (ref 135–145)
Sodium: 115 mmol/L — CL (ref 135–145)

## 2020-10-10 LAB — MAGNESIUM: Magnesium: 2.2 mg/dL (ref 1.7–2.4)

## 2020-10-10 LAB — SODIUM, URINE, RANDOM: Sodium, Ur: <10 mmol/L

## 2020-10-10 LAB — SODIUM
Sodium: 118 mmol/L — CL (ref 135–145)
Sodium: 123 mmol/L — ABNORMAL LOW (ref 135–145)

## 2020-10-10 LAB — GLUCOSE, CAPILLARY
Glucose-Capillary: 138 mg/dL — ABNORMAL HIGH (ref 70–99)
Glucose-Capillary: 138 mg/dL — ABNORMAL HIGH (ref 70–99)

## 2020-10-10 LAB — OSMOLALITY, URINE: Osmolality, Ur: 178 mOsm/kg — ABNORMAL LOW (ref 300–900)

## 2020-10-10 MED ORDER — SODIUM CHLORIDE 3 % IV SOLN
INTRAVENOUS | Status: DC
  Filled 2020-10-10 (×2): qty 500

## 2020-10-10 NOTE — Progress Notes (Signed)
New orders received and implemented.  See MAR, MD orders,  and Flowsheet.  Patient is A/O, no neurologic changes noted.

## 2020-10-10 NOTE — Plan of Care (Signed)
Plan of care reviewed and discussed with the patient. 

## 2020-10-10 NOTE — Progress Notes (Signed)
RN notified MD that pt's sodium came back critical at 118. Pt remains asymptomatic.

## 2020-10-10 NOTE — Progress Notes (Signed)
CRITICAL VALUE ALERT  Critical Value:  Na+: 116  Date & Time Notied:  10/10/2020 @ 0205   Provider Notified: Lorra Hals @ [phone]  Orders Received/Actions taken: no new orders were given.  Will continue to monitor.

## 2020-10-10 NOTE — Progress Notes (Signed)
PROGRESS NOTE    Brad [name]  EUM:[id number] DOB: 25-Feb-1961 DOA: 10/08/2020 PCP: Charlott Rakes, MD   Brief Narrative: Brad Puryearis a 59 y.o.malewith medical history significant ofchronic systolic and diastolic congestive heart failure, permanent atrial fibrillation, alcoholic cirrhosis, CKD stage III who is admitted with nausea, vomiting, and diarrhea. He continues to drink alcohol  and has had multiple admissions for recurrent hyponatremia.  He had surgery for C4-C6 disease and underwent surgical fixation on 08/24/2020.  He has intermittent paresthesias but was evaluated by neurosurgery last hospitalization with no further evaluation recommended.  His Na on admission was 117 which further downtrended to 115. Nephrology was again consulted on admission; he was recommended to restart on IV lasix with fluid restriction.  Sodium levels further down trended, and on 10/09/2020 he was initiated on 3% saline.   Assessment & Plan:   Principal Problem:   Hyponatremia Active Problems:   Polysubstance abuse (HCC)   Chronic hepatitis C with cirrhosis (HCC)   Essential hypertension   DM type 2 (diabetes mellitus, type 2) (HCC)   CKD (chronic kidney disease) stage 3, GFR 30-59 ml/min (HCC)   CAD (coronary artery disease), native coronary artery   Permanent atrial fibrillation   Alcohol dependence syndrome (HCC)   Chronic combined systolic and diastolic CHF (congestive heart failure) (HCC)   Paresthesias   Nausea vomiting and diarrhea  #1 hyponatremia patient with recurrent hospital admissions for the same this is thought to be likely due to alcohol abuse.  Patient is on 3% saline at 50 cc an hour with improvement in sodium to 118.  #2 nausea vomiting and diarrhea likely from alcohol withdrawal which has been resolved.    #3 status post anterior cervical discectomy and fusion of C4-5 and 6 on 07/24/2020.  Continue gabapentin. Seen by neurosurgery last hospital admission no  further recommendations.   MRI C-spine  #4 chronic permanent atrial fibrillation not on anticoagulation due to fall risk and alcohol abuse.  Continue amiodarone.  Patient had been on digoxin in the past which was stopped due to bradycardia in the last admission.  #5 history of combined systolic diastolic heart failure currently stable monitor closely on 3% saline.  #6 history of CKD stage IIIb creatinine 1.47 stable at baseline  #7 history of type 2 diabetes on SSI CBG (last 3)  Recent Labs    10/09/20 2208 10/10/20 0808 10/10/20 1200  GLUCAP 139* 138* 138*     #8 history of essential hypertension blood pressure 128/81 continue BiDil  #9 history of hepatitis C and cirrhosis stable  #10 history of polysubstance abuse-ongoing counseling needed inpatient and outpatient.  Estimated body mass index is 25.8 kg/m as calculated from the following:   Height as of this encounter: 5\' 11"  (1.803 m).   Weight as of this encounter: 83.9 kg.  DVT prophylaxis: Lovenox  code Status: Full code  family Communication: None  disposition Plan:  Status is: Inpatient  Dispo: The patient is from: Home              Anticipated d/c is to: Home              Anticipated d/c date is: 2 days              Patient currently is not medically stable to d/c.    Consultants:   Nephrology  Procedures: None Antimicrobials: None  Subjective: Patient resting in bed is awake alert oriented He reports he lost 2 of his sons and manage her  depression and and drank a lot of alcohol Lives with a roommate  Objective: Vitals:   10/09/20 2215 10/10/20 0624 10/10/20 0630 10/10/20 1202  BP:  116/67  128/81  Pulse:  68  (!) 55  Resp:  18  20  Temp:  (!) 97.4 F (36.3 C)    TempSrc:  Oral    SpO2:  98%  96%  Weight: 84.5 kg  83.9 kg   Height: 5\' 11"  (1.803 m)       Intake/Output Summary (Last 24 hours) at 10/10/2020 1239 Last data filed at 10/10/2020 0900 Gross per 24 hour  Intake 1482.1 ml  Output  950 ml  Net 532.1 ml   Filed Weights   10/09/20 2215 10/10/20 0630  Weight: 84.5 kg 83.9 kg    Examination:  General exam: Appears calm and comfortable  Respiratory system: Clear to auscultation. Respiratory effort normal. Cardiovascular system: S1 & S2 heard, RRR. No JVD, murmurs, rubs, gallops or clicks. No pedal edema. Gastrointestinal system: Abdomen is nondistended, soft and nontender. No organomegaly or masses felt. Normal bowel sounds heard. Central nervous system: Alert and oriented. No focal neurological deficits. Extremities: Symmetric 5 x 5 power. Skin: No rashes, lesions or ulcers Psychiatry: Judgement and insight appear normal. Mood & affect appropriate.     Data Reviewed: I have personally reviewed following labs and imaging studies  CBC: Recent Labs  Lab 10/05/20 0750 10/08/20 0141 10/09/20 0422 10/10/20 0441  WBC 5.0 4.5 4.1 3.4*  NEUTROABS  --   --  2.7 2.2  HGB 11.1* 12.0* 10.7* 10.2*  HCT 34.2* 34.7* 30.6* 29.4*  MCV 88.1 85.3 83.8 84.2  PLT 227 219 188 976   Basic Metabolic Panel: Recent Labs  Lab 10/09/20 0422 10/09/20 0422 10/09/20 0912 10/09/20 0912 10/09/20 1315 10/09/20 1622 10/10/20 0046 10/10/20 0441 10/10/20 1100  NA 115*   < > 112*   < > 114* 114* 116* 115* 118*  K 4.7   < > 4.3  --  4.5 4.4 4.2 4.2  --   CL 86*   < > 86*  --  90* 90* 88* 91*  --   CO2 24   < > 20*  --  21* 20* 20* 20*  --   GLUCOSE 127*   < > 125*  --  113* 107* 161* 135*  --   BUN 28*   < > 29*  --  30* 32* 28* 29*  --   CREATININE 1.54*   < > 1.46*  --  1.54* 1.53* 1.56* 1.47*  --   CALCIUM 7.9*   < > 7.6*  --  7.7* 7.8* 7.7* 7.4*  --   MG 2.1  --   --   --   --   --   --  2.2  --    < > = values in this interval not displayed.   GFR: Estimated Creatinine Clearance: 56.9 mL/min (A) (by C-G formula based on SCr of 1.47 mg/dL (H)). Liver Function Tests: Recent Labs  Lab 10/05/20 0750 10/08/20 0141  AST 26 23  ALT 18 18  ALKPHOS 46 42  BILITOT 0.8 0.8    PROT 6.1* 6.2*  ALBUMIN 2.8* 2.9*   Recent Labs  Lab 10/08/20 0141  LIPASE 31   No results for input(s): AMMONIA in the last 168 hours. Coagulation Profile: No results for input(s): INR, PROTIME in the last 168 hours. Cardiac Enzymes: No results for input(s): CKTOTAL, CKMB, CKMBINDEX, TROPONINI in the last 168  hours. BNP (last 3 results) No results for input(s): PROBNP in the last 8760 hours. HbA1C: No results for input(s): HGBA1C in the last 72 hours. CBG: Recent Labs  Lab 10/09/20 1131 10/09/20 1645 10/09/20 2208 10/10/20 0808 10/10/20 1200  GLUCAP 152* 134* 139* 138* 138*   Lipid Profile: No results for input(s): CHOL, HDL, LDLCALC, TRIG, CHOLHDL, LDLDIRECT in the last 72 hours. Thyroid Function Tests: Recent Labs    10/08/20 0429  TSH 2.757   Anemia Panel: No results for input(s): VITAMINB12, FOLATE, FERRITIN, TIBC, IRON, RETICCTPCT in the last 72 hours. Sepsis Labs: No results for input(s): PROCALCITON, LATICACIDVEN in the last 168 hours.  Recent Results (from the past 240 hour(s))  Respiratory Panel by RT PCR (Flu A&B, Covid) - Nasopharyngeal Swab     Status: None   Collection Time: 10/08/20  3:14 AM   Specimen: Nasopharyngeal Swab  Result Value Ref Range Status   SARS Coronavirus 2 by RT PCR NEGATIVE NEGATIVE Final    Comment: (NOTE) SARS-CoV-2 target nucleic acids are NOT DETECTED.  The SARS-CoV-2 RNA is generally detectable in upper respiratoy specimens during the acute phase of infection. The lowest concentration of SARS-CoV-2 viral copies this assay can detect is 131 copies/mL. A negative result does not preclude SARS-Cov-2 infection and should not be used as the sole basis for treatment or other patient management decisions. A negative result may occur with  improper specimen collection/handling, submission of specimen other than nasopharyngeal swab, presence of viral mutation(s) within the areas targeted by this assay, and inadequate number of  viral copies (<131 copies/mL). A negative result must be combined with clinical observations, patient history, and epidemiological information. The expected result is Negative.  Fact Sheet for Patients:  PinkCheek.be  Fact Sheet for Healthcare Providers:  GravelBags.it  This test is no t yet approved or cleared by the [place] FDA and  has been authorized for detection and/or diagnosis of SARS-CoV-2 by FDA under an Emergency Use Authorization (EUA). This EUA will remain  in effect (meaning this test can be used) for the duration of the COVID-19 declaration under Section 564(b)(1) of the Act, 21 U.S.C. section 360bbb-3(b)(1), unless the authorization is terminated or revoked sooner.     Influenza A by PCR NEGATIVE NEGATIVE Final   Influenza B by PCR NEGATIVE NEGATIVE Final    Comment: (NOTE) The Xpert Xpress SARS-CoV-2/FLU/RSV assay is intended as an aid in  the diagnosis of influenza from Nasopharyngeal swab specimens and  should not be used as a sole basis for treatment. Nasal washings and  aspirates are unacceptable for Xpert Xpress SARS-CoV-2/FLU/RSV  testing.  Fact Sheet for Patients: PinkCheek.be  Fact Sheet for Healthcare Providers: GravelBags.it  This test is not yet approved or cleared by the [place] FDA and  has been authorized for detection and/or diagnosis of SARS-CoV-2 by  FDA under an Emergency Use Authorization (EUA). This EUA will remain  in effect (meaning this test can be used) for the duration of the  Covid-19 declaration under Section 564(b)(1) of the Act, 21  U.S.C. section 360bbb-3(b)(1), unless the authorization is  terminated or revoked. Performed at [facility], [place] [address], [place], [place] [address]          Radiology Studies: No results found.      Scheduled Meds: . amiodarone  200 mg  Oral Daily  . atorvastatin  20 mg Oral q1800  . DULoxetine  60 mg Oral Daily  . enoxaparin (LOVENOX) injection  40  mg Subcutaneous Q24H  . folic acid  1 mg Oral Daily  . gabapentin  900 mg Oral QHS  . insulin aspart  0-9 Units Subcutaneous TID WC  . isosorbide-hydrALAZINE  1 tablet Oral TID  . multivitamin with minerals  1 tablet Oral Daily  . pantoprazole (PROTONIX) IV  40 mg Intravenous Daily  . thiamine  100 mg Oral Daily   Or  . thiamine  100 mg Intravenous Daily   Continuous Infusions: . sodium chloride (hypertonic) 50 mL/hr at 10/10/20 3491     LOS: 2 days     Georgette Shell, MD  10/10/2020, 12:39 PM

## 2020-10-10 NOTE — Progress Notes (Signed)
CRITICAL VALUE ALERT  Critical Value: Na+: 115  Date & Time Notied: 10/10/2020 @ 0525  Provider Notified: Lorra Hals @ [phone]  Orders Received/Actions taken: awaiting any new orders.  Pt. Shows no changes in mental status.

## 2020-10-10 NOTE — Progress Notes (Signed)
[facility] Progress Note  Subjective: no c/o, Na= 115 again today , 3% increased from 30 > 50 cc /hr this am.   Vitals:   10/09/20 2215 10/10/20 0624 10/10/20 0630 10/10/20 1202  BP:  116/67  128/81  Pulse:  68  (!) 55  Resp:  18  20  Temp:  (!) 97.4 F (36.3 C)    TempSrc:  Oral    SpO2:  98%  96%  Weight: 84.5 kg  83.9 kg   Height: '5\' 11"'  (1.803 m)       Exam: alert, nad   no jvd  Chest cta bilat  Cor reg no RG  Abd soft ntnd no ascites   Ext no LE edema   Alert, NF, ox3    Home meds:  - amiodarone 200/ lipitor 20/ asa 81 mg  - lasix 40 bid/ bidil 20-37.5 bid/ Kdur 20  - dapagliflozin 5 qd  - gabapentin 600 am + 900 pm/ cymbalta 60 qd  - prn's/ vitamins/ supplements    UA 10/18 > 0-5 rbc, 6-10 wbc, prot 100   UNa <10,  UCr 78  , Uosm  351    BP's wnl to high 150/96 e.g.    HR 70's low, RR 19  Temp 98  Assessment/ Plan: 1. Hyponatremia - a recurrent diagnosis, has been admitted numerous times for this in the past and has rec'd several different Rx's. Hx syst CHF and heavy beer drinker.  Did not respond well to IV lasix. 3% saline started on 10/19, Na+ not much better. 3% ^'d to 50 cc/hr. Na+ up 118 late am today. Recommend continue.   2. Acute renal insuff - mild, creat 1.32, usual baseline for him is 1.18- 1.40 eGFR > 60.  Creat at baseline 1.47 today.  3. N/V/D - better 4. Combined diast/ syst CHF - EF 30%. Mild leg edema, no pulm edema. Holding diuretics today.  5. Atrial fib - on amio 6. CAD - hx of stent, on asa/ statin 7. HTN - getting home bidil, no other bp lowering meds 8. ETOH abuse 9. H/o drug abuse      Kelly Splinter 10/10/2020, 12:29 PM   Recent Labs  Lab 10/09/20 0422 10/09/20 0912 10/10/20 0046 10/10/20 0441  K 4.7   < > 4.2 4.2  BUN 28*   < > 28* 29*  CREATININE 1.54*   < > 1.56* 1.47*  CALCIUM 7.9*   < > 7.7* 7.4*  HGB 10.7*  --   --  10.2*   < > = values in this interval not displayed.   Inpatient  medications: . amiodarone  200 mg Oral Daily  . atorvastatin  20 mg Oral q1800  . DULoxetine  60 mg Oral Daily  . enoxaparin (LOVENOX) injection  40 mg Subcutaneous Q24H  . folic acid  1 mg Oral Daily  . gabapentin  900 mg Oral QHS  . insulin aspart  0-9 Units Subcutaneous TID WC  . isosorbide-hydrALAZINE  1 tablet Oral TID  . multivitamin with minerals  1 tablet Oral Daily  . pantoprazole (PROTONIX) IV  40 mg Intravenous Daily  . thiamine  100 mg Oral Daily   Or  . thiamine  100 mg Intravenous Daily   . sodium chloride (hypertonic) 50 mL/hr at 10/10/20 1829   acetaminophen **OR** acetaminophen, alum & mag hydroxide-simeth, LORazepam **OR** LORazepam, morphine injection, ondansetron (ZOFRAN) IV

## 2020-10-11 LAB — BASIC METABOLIC PANEL
Anion gap: 7 (ref 5–15)
BUN: 21 mg/dL — ABNORMAL HIGH (ref 6–20)
CO2: 21 mmol/L — ABNORMAL LOW (ref 22–32)
Calcium: 7.7 mg/dL — ABNORMAL LOW (ref 8.9–10.3)
Chloride: 95 mmol/L — ABNORMAL LOW (ref 98–111)
Creatinine, Ser: 1.36 mg/dL — ABNORMAL HIGH (ref 0.61–1.24)
GFR, Estimated: 60 mL/min — ABNORMAL LOW (ref >60)
Glucose, Bld: 169 mg/dL — ABNORMAL HIGH (ref 70–99)
Potassium: 4.4 mmol/L (ref 3.5–5.1)
Sodium: 123 mmol/L — ABNORMAL LOW (ref 135–145)

## 2020-10-11 LAB — CBC WITH DIFFERENTIAL/PLATELET
Abs Immature Granulocytes: 0.01 10*3/uL (ref 0.00–0.07)
Basophils Absolute: 0 10*3/uL (ref 0.0–0.1)
Basophils Relative: 1 %
Eosinophils Absolute: 0.1 10*3/uL (ref 0.0–0.5)
Eosinophils Relative: 2 %
HCT: 31.7 % — ABNORMAL LOW (ref 39.0–52.0)
Hemoglobin: 10.6 g/dL — ABNORMAL LOW (ref 13.0–17.0)
Immature Granulocytes: 0 %
Lymphocytes Relative: 7 %
Lymphs Abs: 0.2 10*3/uL — ABNORMAL LOW (ref 0.7–4.0)
MCH: 29.3 pg (ref 26.0–34.0)
MCHC: 33.4 g/dL (ref 30.0–36.0)
MCV: 87.6 fL (ref 80.0–100.0)
Monocytes Absolute: 0.8 10*3/uL (ref 0.1–1.0)
Monocytes Relative: 25 %
Neutro Abs: 2.2 10*3/uL (ref 1.7–7.7)
Neutrophils Relative %: 65 %
Platelets: 180 10*3/uL (ref 150–400)
RBC: 3.62 MIL/uL — ABNORMAL LOW (ref 4.22–5.81)
RDW: 15.2 % (ref 11.5–15.5)
WBC: 3.4 10*3/uL — ABNORMAL LOW (ref 4.0–10.5)
nRBC: 0 % (ref 0.0–0.2)

## 2020-10-11 LAB — HEPATIC FUNCTION PANEL
ALT: 15 U/L (ref 0–44)
AST: 18 U/L (ref 15–41)
Albumin: 2.4 g/dL — ABNORMAL LOW (ref 3.5–5.0)
Alkaline Phosphatase: 37 U/L — ABNORMAL LOW (ref 38–126)
Bilirubin, Direct: 0.1 mg/dL (ref 0.0–0.2)
Indirect Bilirubin: 0.4 mg/dL (ref 0.3–0.9)
Total Bilirubin: 0.5 mg/dL (ref 0.3–1.2)
Total Protein: 5 g/dL — ABNORMAL LOW (ref 6.5–8.1)

## 2020-10-11 LAB — GLUCOSE, CAPILLARY
Glucose-Capillary: 175 mg/dL — ABNORMAL HIGH (ref 70–99)
Glucose-Capillary: 154 mg/dL — ABNORMAL HIGH (ref 70–99)
Glucose-Capillary: 115 mg/dL — ABNORMAL HIGH (ref 70–99)
Glucose-Capillary: 164 mg/dL — ABNORMAL HIGH (ref 70–99)
Glucose-Capillary: 118 mg/dL — ABNORMAL HIGH (ref 70–99)

## 2020-10-11 LAB — OSMOLALITY, URINE: Osmolality, Ur: 145 mOsm/kg — ABNORMAL LOW (ref 300–900)

## 2020-10-11 LAB — SODIUM
Sodium: 121 mmol/L — ABNORMAL LOW (ref 135–145)
Sodium: 121 mmol/L — ABNORMAL LOW (ref 135–145)
Sodium: 126 mmol/L — ABNORMAL LOW (ref 135–145)

## 2020-10-11 LAB — SODIUM, URINE, RANDOM: Sodium, Ur: <10 mmol/L

## 2020-10-11 LAB — MAGNESIUM: Magnesium: 2 mg/dL (ref 1.7–2.4)

## 2020-10-11 LAB — CREATININE, URINE, RANDOM: Creatinine, Urine: 30.27 mg/dL

## 2020-10-11 MED ORDER — SODIUM CHLORIDE 3 % IV SOLN: INTRAVENOUS | Status: DC

## 2020-10-11 MED ORDER — TOLVAPTAN 15 MG PO TABS
15.0000 mg | ORAL_TABLET | Freq: Once | ORAL | Status: AC
  Administered 2020-10-11: 15 mg via ORAL
  Filled 2020-10-11: qty 1

## 2020-10-11 NOTE — Progress Notes (Signed)
PROGRESS NOTE    Jacobb Alen  DUK:[id number] DOB: Feb 04, 1961 DOA: 10/08/2020 PCP: Charlott Rakes, MD   Brief Narrative: Corday Puryearis a 59 y.o.malewith medical history significant ofchronic systolic and diastolic congestive heart failure, permanent atrial fibrillation, alcoholic cirrhosis, CKD stage III who is admitted with nausea, vomiting, and diarrhea. He continues to drink alcohol  and has had multiple admissions for recurrent hyponatremia.  He had surgery for C4-C6 disease and underwent surgical fixation on 08/24/2020.  He has intermittent paresthesias but was evaluated by neurosurgery last hospitalization with no further evaluation recommended.  His Na on admission was 117 which further downtrended to 115. Nephrology was again consulted on admission; he was recommended to restart on IV lasix with fluid restriction.  Sodium levels further down trended, and on 10/09/2020 he was initiated on 3% saline.   Assessment & Plan:   Principal Problem:   Hyponatremia Active Problems:   Polysubstance abuse (HCC)   Chronic hepatitis C with cirrhosis (HCC)   Essential hypertension   DM type 2 (diabetes mellitus, type 2) (HCC)   CKD (chronic kidney disease) stage 3, GFR 30-59 ml/min (HCC)   CAD (coronary artery disease), native coronary artery   Permanent atrial fibrillation   Alcohol dependence syndrome (HCC)   Chronic combined systolic and diastolic CHF (congestive heart failure) (HCC)   Paresthesias   Nausea vomiting and diarrhea  #1 hyponatremia patient with recurrent hospital admissions for the same this is thought to be likely due to alcohol abuse.  Patient was treated with 3% saline.  Sodium 121.  To be started on tolvaptan today.  #2 nausea vomiting and diarrhea likely from alcohol withdrawal which has been resolved.    #3 status post anterior cervical discectomy and fusion of C4-5 and 6 on 07/24/2020.  Continue gabapentin. Seen by neurosurgery last hospital admission  no further recommendations.   MRI C-spine postsurgical changes C4-C6 mild to moderate stenosis C4-C6 improved from prior.  #4 chronic permanent atrial fibrillation not on anticoagulation due to fall risk and alcohol abuse.  Continue amiodarone.  Patient had been on digoxin in the past which was stopped due to bradycardia in the last admission.  2.  #5 history of combined systolic diastolic heart failure currently stable   #6 history of CKD stage IIIb creatinine 1.47 stable at baseline  #7 history of type 2 diabetes on SSI CBG (last 3)  Recent Labs    10/10/20 2235 10/11/20 0817 10/11/20 1203  GLUCAP 175* 154* 115*     #8 history of essential hypertension blood pressure 128/81 continue BiDil  #9 history of hepatitis C and cirrhosis stable  #10 history of polysubstance abuse-ongoing counseling needed inpatient and outpatient.  Estimated body mass index is 25.8 kg/m as calculated from the following:   Height as of this encounter: 5\' 11"  (1.803 m).   Weight as of this encounter: 83.9 kg.  DVT prophylaxis: Lovenox  code Status: Full code  family Communication: None  disposition Plan:  Status is: Inpatient  Dispo: The patient is from: Home              Anticipated d/c is to: Home              Anticipated d/c date is: 2 days              Patient currently is not medically stable to d/c.    Consultants:   Nephrology  Procedures: None Antimicrobials: None  Subjective: Patient resting in bed is awake alert oriented He reports  he lost 2 of his sons and manage her depression and and drank a lot of alcohol Lives with a roommate  Objective: Vitals:   10/10/20 0630 10/10/20 1202 10/10/20 2239 10/11/20 0442  BP:  128/81 113/69 138/79  Pulse:  (!) 55 62 62  Resp:  20 16 16   Temp:   97.7 F (36.5 C) 97.8 F (36.6 C)  TempSrc:      SpO2:  96% 99% 99%  Weight: 83.9 kg     Height:        Intake/Output Summary (Last 24 hours) at 10/11/2020 1319 Last data filed at  10/11/2020 0900 Gross per 24 hour  Intake 1026.67 ml  Output 1950 ml  Net -923.33 ml   Filed Weights   10/09/20 2215 10/10/20 0630  Weight: 84.5 kg 83.9 kg    Examination:  General exam: Appears calm and comfortable  Respiratory system: Clear to auscultation. Respiratory effort normal. Cardiovascular system: S1 & S2 heard, RRR. No JVD, murmurs, rubs, gallops or clicks. No pedal edema. Gastrointestinal system: Abdomen is nondistended, soft and nontender. No organomegaly or masses felt. Normal bowel sounds heard. Central nervous system: Alert and oriented. No focal neurological deficits. Extremities: Symmetric 5 x 5 power. Skin: No rashes, lesions or ulcers Psychiatry: Judgement and insight appear normal. Mood & affect appropriate.     Data Reviewed: I have personally reviewed following labs and imaging studies  CBC: Recent Labs  Lab 10/05/20 0750 10/08/20 0141 10/09/20 0422 10/10/20 0441 10/11/20 0402  WBC 5.0 4.5 4.1 3.4* 3.4*  NEUTROABS  --   --  2.7 2.2 2.2  HGB 11.1* 12.0* 10.7* 10.2* 10.6*  HCT 34.2* 34.7* 30.6* 29.4* 31.7*  MCV 88.1 85.3 83.8 84.2 87.6  PLT 227 219 188 167 132   Basic Metabolic Panel: Recent Labs  Lab 10/09/20 0422 10/09/20 0422 10/09/20 0912 10/09/20 0912 10/09/20 1315 10/09/20 1315 10/09/20 1622 10/09/20 1622 10/10/20 0046 10/10/20 0046 10/10/20 0441 10/10/20 1100 10/10/20 1648 10/11/20 0400 10/11/20 0402 10/11/20 1129  NA 115*   < > 112*   < > 114*   < > 114*   < > 116*   < > 115* 118* 123* 121*  --  121*  K 4.7   < > 4.3  --  4.5  --  4.4  --  4.2  --  4.2  --   --   --   --   --   CL 86*   < > 86*  --  90*  --  90*  --  88*  --  91*  --   --   --   --   --   CO2 24   < > 20*  --  21*  --  20*  --  20*  --  20*  --   --   --   --   --   GLUCOSE 127*   < > 125*  --  113*  --  107*  --  161*  --  135*  --   --   --   --   --   BUN 28*   < > 29*  --  30*  --  32*  --  28*  --  29*  --   --   --   --   --   CREATININE 1.54*   < >  1.46*  --  1.54*  --  1.53*  --  1.56*  --  1.47*  --   --   --   --   --  CALCIUM 7.9*   < > 7.6*  --  7.7*  --  7.8*  --  7.7*  --  7.4*  --   --   --   --   --   MG 2.1  --   --   --   --   --   --   --   --   --  2.2  --   --   --  2.0  --    < > = values in this interval not displayed.   GFR: Estimated Creatinine Clearance: 56.9 mL/min (A) (by C-G formula based on SCr of 1.47 mg/dL (H)). Liver Function Tests: Recent Labs  Lab 10/05/20 0750 10/08/20 0141  AST 26 23  ALT 18 18  ALKPHOS 46 42  BILITOT 0.8 0.8  PROT 6.1* 6.2*  ALBUMIN 2.8* 2.9*   Recent Labs  Lab 10/08/20 0141  LIPASE 31   No results for input(s): AMMONIA in the last 168 hours. Coagulation Profile: No results for input(s): INR, PROTIME in the last 168 hours. Cardiac Enzymes: No results for input(s): CKTOTAL, CKMB, CKMBINDEX, TROPONINI in the last 168 hours. BNP (last 3 results) No results for input(s): PROBNP in the last 8760 hours. HbA1C: No results for input(s): HGBA1C in the last 72 hours. CBG: Recent Labs  Lab 10/10/20 0808 10/10/20 1200 10/10/20 2235 10/11/20 0817 10/11/20 1203  GLUCAP 138* 138* 175* 154* 115*   Lipid Profile: No results for input(s): CHOL, HDL, LDLCALC, TRIG, CHOLHDL, LDLDIRECT in the last 72 hours. Thyroid Function Tests: No results for input(s): TSH, T4TOTAL, FREET4, T3FREE, THYROIDAB in the last 72 hours. Anemia Panel: No results for input(s): VITAMINB12, FOLATE, FERRITIN, TIBC, IRON, RETICCTPCT in the last 72 hours. Sepsis Labs: No results for input(s): PROCALCITON, LATICACIDVEN in the last 168 hours.  Recent Results (from the past 240 hour(s))  Respiratory Panel by RT PCR (Flu A&B, Covid) - Nasopharyngeal Swab     Status: None   Collection Time: 10/08/20  3:14 AM   Specimen: Nasopharyngeal Swab  Result Value Ref Range Status   SARS Coronavirus 2 by RT PCR NEGATIVE NEGATIVE Final    Comment: (NOTE) SARS-CoV-2 target nucleic acids are NOT DETECTED.  The  SARS-CoV-2 RNA is generally detectable in upper respiratoy specimens during the acute phase of infection. The lowest concentration of SARS-CoV-2 viral copies this assay can detect is 131 copies/mL. A negative result does not preclude SARS-Cov-2 infection and should not be used as the sole basis for treatment or other patient management decisions. A negative result may occur with  improper specimen collection/handling, submission of specimen other than nasopharyngeal swab, presence of viral mutation(s) within the areas targeted by this assay, and inadequate number of viral copies (<131 copies/mL). A negative result must be combined with clinical observations, patient history, and epidemiological information. The expected result is Negative.  Fact Sheet for Patients:  PinkCheek.be  Fact Sheet for Healthcare Providers:  GravelBags.it  This test is no t yet approved or cleared by the [place] FDA and  has been authorized for detection and/or diagnosis of SARS-CoV-2 by FDA under an Emergency Use Authorization (EUA). This EUA will remain  in effect (meaning this test can be used) for the duration of the COVID-19 declaration under Section 564(b)(1) of the Act, 21 U.S.C. section 360bbb-3(b)(1), unless the authorization is terminated or revoked sooner.     Influenza A by PCR NEGATIVE NEGATIVE Final   Influenza B by PCR NEGATIVE NEGATIVE Final  Comment: (NOTE) The Xpert Xpress SARS-CoV-2/FLU/RSV assay is intended as an aid in  the diagnosis of influenza from Nasopharyngeal swab specimens and  should not be used as a sole basis for treatment. Nasal washings and  aspirates are unacceptable for Xpert Xpress SARS-CoV-2/FLU/RSV  testing.  Fact Sheet for Patients: PinkCheek.be  Fact Sheet for Healthcare Providers: GravelBags.it  This test is not yet approved or cleared  by the [place] FDA and  has been authorized for detection and/or diagnosis of SARS-CoV-2 by  FDA under an Emergency Use Authorization (EUA). This EUA will remain  in effect (meaning this test can be used) for the duration of the  Covid-19 declaration under Section 564(b)(1) of the Act, 21  U.S.C. section 360bbb-3(b)(1), unless the authorization is  terminated or revoked. Performed at [facility], [place] [address] [place], [place] [address]          Radiology Studies: No results found.      Scheduled Meds: . amiodarone  200 mg Oral Daily  . atorvastatin  20 mg Oral q1800  . DULoxetine  60 mg Oral Daily  . enoxaparin (LOVENOX) injection  40 mg Subcutaneous Q24H  . folic acid  1 mg Oral Daily  . gabapentin  900 mg Oral QHS  . insulin aspart  0-9 Units Subcutaneous TID WC  . isosorbide-hydrALAZINE  1 tablet Oral TID  . multivitamin with minerals  1 tablet Oral Daily  . pantoprazole (PROTONIX) IV  40 mg Intravenous Daily  . thiamine  100 mg Oral Daily   Or  . thiamine  100 mg Intravenous Daily  . tolvaptan  15 mg Oral Once   Continuous Infusions:    LOS: 3 days     Georgette Shell, MD  10/11/2020, 1:19 PM

## 2020-10-11 NOTE — Evaluation (Signed)
Physical Therapy Evaluation Patient Details Name: Brad [name] MRN: [id number] DOB: [birth year] Today's Date: 10/11/2020   History of Present Illness  Pt is a 59 y.o. male with medical history significant of chronic systolic and diastolic congestive heart failure, permanent atrial fibrillation, alcoholic cirrhosis, CKD stage III who is admitted with nausea, vomiting, and diarrhea.He continues to drink alcohol  and has had multiple admissions for recurrent hyponatremia.  Clinical Impression  Pt admitted with above diagnosis. Pt demonstrated safe transfers and ambulation with supervision.  Pt demonstrated steady dynamic balance and is low fall risk from PT perspective.  He has necessary DME and does not require assist with mobility or ADLs.  No further acute PT indicated.  Pt did mention that he had outpt PT set up to help with neck/R arm/hand and he is "considering" this - recommend pt f/u on this if desired.     Follow Up Recommendations Outpatient PT (if pt desires - reports this was already sset up)    Equipment Recommendations  None recommended by PT    Recommendations for Other Services       Precautions / Restrictions Precautions Precautions: None      Mobility  Bed Mobility Overal bed mobility: Independent                  Transfers Overall transfer level: Needs assistance   Transfers: Sit to/from Stand Sit to Stand: Supervision         General transfer comment: demonstrated safely  Ambulation/Gait Ambulation/Gait assistance: Supervision Gait Distance (Feet): 80 Feet Assistive device: None Gait Pattern/deviations: WFL(Within Functional Limits) Gait velocity: normal   General Gait Details: demonstrated ambulation in room safely, declined ambulation in hall  Stairs            Wheelchair Mobility    Modified [name] (Stroke Patients Only)       Balance Overall balance assessment: Independent   Sitting balance-Leahy Scale: Normal      Standing balance support: No upper extremity supported Standing balance-Leahy Scale: Normal               High level balance activites: Side stepping;Backward walking;Direction changes;Turns;Head turns;Other (comment) High Level Balance Comments: navigating around object             Pertinent Vitals/Pain Pain Assessment: No/denies pain    Home Living Family/patient expects to be discharged to:: Private residence Living Arrangements: Other (Comment) (roomate) Available Help at Discharge: Friend(s);Available PRN/intermittently Type of Home: Apartment Home Access: Level entry     Home Layout: One level Home Equipment: Walker - 2 wheels;Cane - single point      Prior Function Level of Independence: Independent with assistive device(s)         Comments: Pt reports using SPC in community, spends time sitting on porch. Pt reports independent with cooking, cleaning, bathing dressing. Pt reports not currently driving, but roommate can provide transportation.  Denies falls.  Reports he was considering going to do outpt PT for neck/hand.     Hand Dominance   Dominant Hand: Right    Extremity/Trunk Assessment   Upper Extremity Assessment Upper Extremity Assessment: Overall WFL for tasks assessed (reports numbness in R hand that comes and goes; has been evaluated by neurosurgeron last visit)    Lower Extremity Assessment Lower Extremity Assessment: Overall WFL for tasks assessed    Cervical / Trunk Assessment Cervical / Trunk Assessment: Other exceptions Cervical / Trunk Exceptions: forward head  Communication   Communication: No difficulties  Cognition Arousal/Alertness:  Awake/alert Behavior During Therapy: WFL for tasks assessed/performed Overall Cognitive Status: Within Functional Limits for tasks assessed                                        General Comments General comments (skin integrity, edema, etc.): VSS    Exercises      Assessment/Plan    PT Assessment Patent does not need any further PT services  PT Problem List         PT Treatment Interventions      PT Goals (Current goals can be found in the Care Plan section)  Acute Rehab PT Goals Patient Stated Goal: return home; take a nap PT Goal Formulation: All assessment and education complete, DC therapy Potential to Achieve Goals: Good    Frequency     Barriers to discharge        Co-evaluation               AM-PAC PT "6 Clicks" Mobility  Outcome Measure Help needed turning from your back to your side while in a flat bed without using bedrails?: None Help needed moving from lying on your back to sitting on the side of a flat bed without using bedrails?: None Help needed moving to and from a bed to a chair (including a wheelchair)?: None Help needed standing up from a chair using your arms (e.g., wheelchair or bedside chair)?: None Help needed to walk in hospital room?: None Help needed climbing 3-5 steps with a railing? : None 6 Click Score: 24    End of Session   Activity Tolerance: Patient tolerated treatment well Patient left: in bed;with call bell/phone within reach;with bed alarm set Nurse Communication: Mobility status      Time: 1500-1520 PT Time Calculation (min) (ACUTE ONLY): 20 min   Charges:   PT Evaluation $PT Eval Low Complexity: 1 Low          [place], PT Acute Rehab Services Pager [phone] [facility] [phone]    Brad [name] 10/11/2020, 3:35 PM

## 2020-10-11 NOTE — Progress Notes (Signed)
[facility] Progress Note  Subjective: no c/o. Na+ up to 123 , 3%held and Na+ down to 121 this am.   Vitals:   10/10/20 0630 10/10/20 1202 10/10/20 2239 10/11/20 0442  BP:  128/81 113/69 138/79  Pulse:  (!) 55 62 62  Resp:  _0 Temp:   97.7 F (36.5 C) 97.8 F (36.6 C)  TempSrc:      SpO2:  96% 99% 99%  Weight: 83.9 kg     Height:        Exam: alert, nad   no jvd  Chest cta bilat  Cor reg no RG  Abd soft ntnd no ascites   Ext no LE edema   Alert, NF, ox3    Home meds:  - amiodarone 200/ lipitor 20/ asa 81 mg  - lasix 40 bid/ bidil 20-37.5 bid/ Kdur 20  - dapagliflozin 5 qd  - gabapentin 600 am + 900 pm/ cymbalta 60 qd  - prn's/ vitamins/ supplements    UA 10/18 > 0-5 rbc, 6-10 wbc, prot 100   UNa <10,  UCr 78  , Uosm  351    BP's wnl to high 150/96 e.g.    HR 70's low, RR 19  Temp 98     Abd CT > Kidneys/bladder: Negative noncontrast kidneys. Circumferential thickening of the urinary bladder (series 2, image 72) appears stable since July. No urinary calculus. Both ureters are decompressed and normal to the bladder.  Assessment/ Plan: 1. Hyponatremia - a recurrent diagnosis, has been admitted numerous times for this in the past and has rec'd several different Rx's. Hx syst CHF and heavy beer drinker.  Did not respond well to IV lasix. 3% started w/ good results, held overnight, Na+ 121 today. Will plan tolvaptan and f/u Na q 8hrs. Will follow.  2. AoCKD 3a - baseline creat 1.3- 1.5, eGFR 47- 59 ml/min. prob d/t HTN. Creat here 1.4. Good UOP. Normal appearing kidneys by abd CT here.  3. Combined diast/ syst CHF - EF 30%. Euvolemic here, CXR w/o edema.   4. Atrial fib - on amio 5. CAD - hx of stent, on asa/ statin 6. HTN - getting home bidil, no other bp lowering meds 7. ETOH abuse 8. H/o drug abuse      Brad [name] 10/11/2020, 11:12 AM   Recent Labs  Lab 10/09/20 0422 10/10/20 0046 10/10/20 0441 10/11/20 0402  K  --  4.2 4.2   --   BUN  --  28* 29*  --   CREATININE  --  1.56* 1.47*  --   CALCIUM  --  7.7* 7.4*  --   HGB   < >  --  10.2* 10.6*   < > = values in this interval not displayed.   Inpatient medications: . amiodarone  200 mg Oral Daily  . atorvastatin  20 mg Oral q1800  . DULoxetine  60 mg Oral Daily  . enoxaparin (LOVENOX) injection  40 mg Subcutaneous Q24H  . folic acid  1 mg Oral Daily  . gabapentin  900 mg Oral QHS  . insulin aspart  0-9 Units Subcutaneous TID WC  . isosorbide-hydrALAZINE  1 tablet Oral TID  . multivitamin with minerals  1 tablet Oral Daily  . pantoprazole (PROTONIX) IV  40 mg Intravenous Daily  . thiamine  100 mg Oral Daily   Or  . thiamine  100 mg Intravenous Daily   . sodium chloride (hypertonic)     acetaminophen **OR**  acetaminophen, alum & mag hydroxide-simeth, morphine injection, ondansetron (ZOFRAN) IV

## 2020-10-12 LAB — BASIC METABOLIC PANEL
Anion gap: 6 (ref 5–15)
BUN: 22 mg/dL — ABNORMAL HIGH (ref 6–20)
CO2: 21 mmol/L — ABNORMAL LOW (ref 22–32)
Calcium: 8 mg/dL — ABNORMAL LOW (ref 8.9–10.3)
Chloride: 101 mmol/L (ref 98–111)
Creatinine, Ser: 1.29 mg/dL — ABNORMAL HIGH (ref 0.61–1.24)
GFR, Estimated: >60 mL/min (ref >60)
Glucose, Bld: 129 mg/dL — ABNORMAL HIGH (ref 70–99)
Potassium: 4.5 mmol/L (ref 3.5–5.1)
Sodium: 128 mmol/L — ABNORMAL LOW (ref 135–145)

## 2020-10-12 LAB — CBC WITH DIFFERENTIAL/PLATELET
Abs Immature Granulocytes: 0.01 10*3/uL (ref 0.00–0.07)
Basophils Absolute: 0 10*3/uL (ref 0.0–0.1)
Basophils Relative: 1 %
Eosinophils Absolute: 0.1 10*3/uL (ref 0.0–0.5)
Eosinophils Relative: 4 %
HCT: 31.4 % — ABNORMAL LOW (ref 39.0–52.0)
Hemoglobin: 10.6 g/dL — ABNORMAL LOW (ref 13.0–17.0)
Immature Granulocytes: 0 %
Lymphocytes Relative: 7 %
Lymphs Abs: 0.3 10*3/uL — ABNORMAL LOW (ref 0.7–4.0)
MCH: 29.4 pg (ref 26.0–34.0)
MCHC: 33.8 g/dL (ref 30.0–36.0)
MCV: 87.2 fL (ref 80.0–100.0)
Monocytes Absolute: 0.9 10*3/uL (ref 0.1–1.0)
Monocytes Relative: 22 %
Neutro Abs: 2.7 10*3/uL (ref 1.7–7.7)
Neutrophils Relative %: 66 %
Platelets: 169 10*3/uL (ref 150–400)
RBC: 3.6 MIL/uL — ABNORMAL LOW (ref 4.22–5.81)
RDW: 15.6 % — ABNORMAL HIGH (ref 11.5–15.5)
WBC: 4 10*3/uL (ref 4.0–10.5)
nRBC: 0 % (ref 0.0–0.2)

## 2020-10-12 LAB — GLUCOSE, CAPILLARY
Glucose-Capillary: 123 mg/dL — ABNORMAL HIGH (ref 70–99)
Glucose-Capillary: 120 mg/dL — ABNORMAL HIGH (ref 70–99)

## 2020-10-12 LAB — MAGNESIUM: Magnesium: 2.1 mg/dL (ref 1.7–2.4)

## 2020-10-12 MED ORDER — SODIUM CHLORIDE 1 G PO TABS
2.0000 g | ORAL_TABLET | Freq: Three times a day (TID) | ORAL | 0 refills | Status: DC
Start: 2020-10-12 — End: 2021-02-03

## 2020-10-12 MED ORDER — THIAMINE HCL 100 MG PO TABS: 100.0000 mg | ORAL_TABLET | Freq: Every day | ORAL | Status: AC

## 2020-10-12 MED ORDER — SODIUM CHLORIDE 1 G PO TABS
2.0000 g | ORAL_TABLET | Freq: Three times a day (TID) | ORAL | Status: DC
  Administered 2020-10-12: 2 g via ORAL
  Filled 2020-10-12: qty 2

## 2020-10-12 NOTE — Progress Notes (Signed)
Pt d/c to home. Pt stable for discharge. PIV removed. Telemtery removed. Discharge paperwork discussed with pt. Pt confirmed understanding with teachback method. Pt escorted off of unit to care and car of friend/roommate.

## 2020-10-12 NOTE — Progress Notes (Signed)
[facility] Progress Note  Subjective: no c/o. Na+ up to 128 after tolvaptan.    Vitals:   10/10/20 2239 10/11/20 0442 10/11/20 2142 10/12/20 0459  BP: 113/69 138/79 (!) 141/87 130/81  Pulse: 62 62 (!) 58 63  Resp: '16 16 18 17  ' Temp: 97.7 F (36.5 C) 97.8 F (36.6 C) (!) 97.5 F (36.4 C) 98.1 F (36.7 C)  TempSrc:   Oral   SpO2: 99% 99% 97% 97%  Weight:      Height:        Exam: alert, nad   no jvd  Chest cta bilat  Cor reg no RG  Abd soft ntnd no ascites   Ext no LE edema   Alert, NF, ox3    Home meds:  - amiodarone 200/ lipitor 20/ asa 81 mg  - lasix 40 bid/ bidil 20-37.5 bid/ Kdur 20  - dapagliflozin 5 qd  - gabapentin 600 am + 900 pm/ cymbalta 60 qd  - prn's/ vitamins/ supplements    UA 10/18 > 0-5 rbc, 6-10 wbc, prot 100   UNa <10,  UCr 78  , Uosm  351    BP's wnl to high 150/96 e.g.    HR 70's low, RR 19  Temp 98     Abd CT > Kidneys/bladder: Negative noncontrast kidneys. Circumferential thickening of the urinary bladder (series 2, image 72) appears stable since July. No urinary calculus. Both ureters are decompressed and normal to the bladder.  Assessment/ Plan: 1. Hyponatremia - a recurrent diagnosis, has been admitted numerous times for this in the past and has rec'd several different Rx's. This time suspect this is beer potomania causing hyponatremia. Didn't respond to IV lasix. Better after 3% and then tolvaptan. Needs more solute in his diet, will give him salt tabs 5m tid for 1 week then he should f/u w/ his PCP. He should stop drinking beer.  2. AoCKD 3a - baseline creat 1.3- 1.5, eGFR 47- 59 ml/min. prob d/t HTN. Creat here 1.4. Good UOP. Normal appearing kidneys by abd CT here.  3. Combined diast/ syst CHF - EF 30%. Euvolemic here, CXR w/o edema.   4. Atrial fib - on amio 5. CAD - hx of stent, on asa/ statin 6. HTN - getting home bidil, no other bp lowering meds 7. ETOH abuse 8. H/o drug abuse      RKelly Splinter10/22/2021,  9:43 AM   Recent Labs  Lab 10/10/20 0441 10/11/20 0402 10/11/20 1350 10/12/20 0456  K   < >  --  4.4 4.5  BUN   < >  --  21* 22*  CREATININE   < >  --  1.36* 1.29*  CALCIUM   < >  --  7.7* 8.0*  HGB  --  10.6*  --  10.6*   < > = values in this interval not displayed.   Inpatient medications: . amiodarone  200 mg Oral Daily  . atorvastatin  20 mg Oral q1800  . DULoxetine  60 mg Oral Daily  . enoxaparin (LOVENOX) injection  40 mg Subcutaneous Q24H  . folic acid  1 mg Oral Daily  . gabapentin  900 mg Oral QHS  . insulin aspart  0-9 Units Subcutaneous TID WC  . isosorbide-hydrALAZINE  1 tablet Oral TID  . multivitamin with minerals  1 tablet Oral Daily  . pantoprazole (PROTONIX) IV  40 mg Intravenous Daily  . sodium chloride  2 g Oral TID WC  . thiamine  100 mg Oral Daily   Or  . thiamine  100 mg Intravenous Daily    acetaminophen **OR** acetaminophen, alum & mag hydroxide-simeth, morphine injection, ondansetron (ZOFRAN) IV

## 2020-10-12 NOTE — Discharge Summary (Signed)
Physician Discharge Summary  Brad [name] CVE:[id number] DOB: January 10, 1961 DOA: 10/08/2020  PCP: Charlott Rakes, MD  Admit date: 10/08/2020 Discharge date: 10/12/2020  Admitted From:home Disposition:  home Recommendations for Outpatient Follow-up:  1. Follow up with PCP in 1-2 weeks 2. Please obtain BMP/CBC in one week  Home Health none Equipment/Devices none  Discharge Condition stable CODE STATUS:full Diet recommendation:cardiac Brief/Interim Summary:Brad Puryearis a 58 y.o.malewith medical history significant ofchronic systolic and diastolic congestive heart failure, permanent atrial fibrillation, alcoholic cirrhosis, CKD stage III who is admitted with nausea, vomiting, and diarrhea. He continues to drink alcohol  and has had multiple admissions for recurrent hyponatremia.  He had surgery for C4-C6 disease and underwent surgical fixation on 08/24/2020. He has intermittent paresthesias but was evaluated by neurosurgery last hospitalization with no further evaluation recommended.  His Na on admission was 117 which further downtrended to 115. Nephrology was again consulted on admission; he was recommended to restart on IV lasix with fluid restriction. Sodium levels further down trended, and on 10/09/2020 he was initiated on 3% saline.   Discharge Diagnoses:  Principal Problem:   Hyponatremia Active Problems:   Polysubstance abuse (HCC)   Chronic hepatitis C with cirrhosis (HCC)   Essential hypertension   DM type 2 (diabetes mellitus, type 2) (HCC)   CKD (chronic kidney disease) stage 3, GFR 30-59 ml/min (HCC)   CAD (coronary artery disease), native coronary artery   Permanent atrial fibrillation   Alcohol dependence syndrome (HCC)   Chronic combined systolic and diastolic CHF (congestive heart failure) (HCC)   Paresthesias   Nausea vomiting and diarrhea  #1 hyponatremia patient with recurrent hospital admissions for the same this is thought to be likely due to  alcohol abuse.  Patient was treated with 3% saline and tolvaptan.  Sodium on the day of discharge was 128.  Counseled patient multiple times on the risk of ongoing alcohol abuse.  He reports 2 of his sons died recently and has been going through a lot of stress related to that.  He lives with a roommate and he is wanting to quit drinking alcohol.  #2 nausea vomiting and diarrhea likely from alcohol withdrawal which has been resolved.    #3 status post anterior cervical discectomy and fusion of C4-5 and 6 on 07/24/2020.  Continue gabapentin. Seen by neurosurgery last hospital admission no further recommendations.   MRI C-spine postsurgical changes C4-C6 mild to moderate stenosis C4-C6 improved from prior.  #4 chronic permanent atrial fibrillation not on anticoagulation due to fall risk and alcohol abuse.  Continue amiodarone.  Patient had been on digoxin in the past which was stopped due to bradycardia in the last admission.  #5 history of combined systolic diastolic heart failure currently stable continue home meds.  #6 history of CKD stage IIIb creatinine 1.29 stable at baseline  #7 history of type 2 diabetes continue home meds Farxiga.  #8 history of essential hypertension -blood pressure 130/81 continue BiDil.    #9 history of hepatitis C and cirrhosis stable  #10 history of polysubstance abuse-ongoing counseling needed inpatient and outpatient.   Estimated body mass index is 25.8 kg/m as calculated from the following:   Height as of this encounter: '5\' 11"'  (1.803 m).   Weight as of this encounter: 83.9 kg.  Discharge Instructions  Discharge Instructions    Call MD for:  difficulty breathing, headache or visual disturbances   Complete by: As directed    Call MD for:  persistant nausea and vomiting   Complete by:  As directed    Call MD for:  temperature >100.4   Complete by: As directed    Diet - low sodium heart healthy   Complete by: As directed    Increase activity  slowly   Complete by: As directed      Allergies as of 10/12/2020      Reactions   Angiotensin Receptor Blockers Anaphylaxis, Other (See Comments)   (Angioedema also with Lisinopril, therefore ARB's are contraindicated)   Lisinopril Anaphylaxis, Swelling   Throat swelling   Pamelor [nortriptyline Hcl] Anaphylaxis, Swelling   Throat swells      Medication List    STOP taking these medications   aspirin EC 81 MG tablet     TAKE these medications   Accu-Chek Guide Me w/Device Kit Use as instructed daily   Accu-Chek Guide test strip Generic drug: glucose blood Use as instructed daily   amiodarone 200 MG tablet Commonly known as: PACERONE Take 1 tablet (200 mg total) by mouth daily.   atorvastatin 20 MG tablet Commonly known as: LIPITOR Take 1 tablet (20 mg total) by mouth daily at 6 PM.   dapagliflozin propanediol 5 MG Tabs tablet Commonly known as: Farxiga Take 1 tablet (5 mg total) by mouth daily before breakfast.   DULoxetine 60 MG capsule Commonly known as: Cymbalta Take 1 capsule (60 mg total) by mouth daily.   folic acid 1 MG tablet Commonly known as: FOLVITE Take 1 mg by mouth daily.   furosemide 40 MG tablet Commonly known as: LASIX Take 1 tablet (40 mg total) by mouth 2 (two) times daily.   gabapentin 300 MG capsule Commonly known as: NEURONTIN 2 caps in the morning and 3 caps in the evening   isosorbide-hydrALAZINE 20-37.5 MG tablet Commonly known as: BIDIL Take 1 tablet by mouth 3 (three) times daily.   potassium chloride SA 20 MEQ tablet Commonly known as: KLOR-CON Take 1 tablet (20 mEq total) by mouth daily.   sodium chloride 1 g tablet Take 2 tablets (2 g total) by mouth 3 (three) times daily with meals.   thiamine 100 MG tablet Take 1 tablet (100 mg total) by mouth daily. Start taking on: October 13, 2020       Follow-up Information    Charlott Rakes, MD Follow up.   Specialty: Family Medicine Contact information: [place] [address] [phone]        [name], MD .   Specialty: Cardiology Contact information: [address] [place] [address] [phone]              Allergies  Allergen Reactions  . Angiotensin Receptor Blockers Anaphylaxis and Other (See Comments)    (Angioedema also with Lisinopril, therefore ARB's are contraindicated)  . Lisinopril Anaphylaxis and Swelling    Throat swelling  . Pamelor [Nortriptyline Hcl] Anaphylaxis and Swelling    Throat swells    Consultations:  renal   Procedures/Studies: DG Chest 2 View  Result Date: 09/18/2020 CLINICAL DATA:  Chest pain. EXAM: CHEST - 2 VIEW COMPARISON:  None. FINDINGS: There is no evidence of acute infiltrate, pleural effusion or pneumothorax. The cardiac silhouette is markedly enlarged. The visualized skeletal structures are unremarkable. IMPRESSION: Cardiomegaly without evidence of acute or active cardiopulmonary disease. Electronically Signed   By: Virgina Norfolk M.D.   On: 09/18/2020 23:04   MR CERVICAL SPINE WO CONTRAST  Result Date: 10/08/2020 EXAM: MRI CERVICAL SPINE WITHOUT CONTRAST TECHNIQUE: Multiplanar, multisequence MR imaging of the  cervical spine was performed. No intravenous contrast was administered. COMPARISON:  Cervical radiographs 09/04/2020, MRI 07/10/2020 FINDINGS: Severely limited examination secondary to patient intolerance and motion. Within this limitation: Alignment: Mild straightening of the normal cervical lordosis. Otherwise, no evidence of substantial subluxation. Vertebrae: Limited evaluation without obvious marrow signal abnormality. Vertebral body heights are maintained. Postsurgical changes of C4 through C6 ACDF. Cord: No obvious cord signal abnormality; however, areas of previously seen cord signal abnormality at C4-C5 and C5-C6 are poorly characterized given motion. Posterior Fossa, vertebral arteries, paraspinal tissues: Nondiagnostic  evaluation of the paraspinal tissues. The visualized fossa is unremarkable. Disc levels: Mild to moderate stenosis is suspected C4-C5 and C5-C6. Otherwise, no evidence of significant canal stenosis. Nondiagnostic evaluation of the foramina secondary to motion. IMPRESSION: Severely limited examination secondary to patient intolerance and motion. Postsurgical changes of C4 through C6 ACDF. Mild to moderate stenosis is suspected at C4-C5 and C5-C6, improved from prior. Nondiagnostic evaluation of the foramina. Electronically Signed   By: Margaretha Sheffield MD   On: 10/08/2020 07:49   DG CHEST PORT 1 VIEW  Result Date: 10/08/2020 CLINICAL DATA:  Shortness of breath. EXAM: PORTABLE CHEST 1 VIEW COMPARISON:  October 05, 2020. FINDINGS: Stable cardiomegaly. No pneumothorax or pleural effusion is noted. Lungs are clear. Bony thorax is unremarkable. IMPRESSION: No active disease. Electronically Signed   By: Marijo Conception M.D.   On: 10/08/2020 08:48   DG Chest Portable 1 View  Result Date: 10/05/2020 CLINICAL DATA:  Shortness of breath. EXAM: PORTABLE CHEST 1 VIEW COMPARISON:  September 18, 2020. FINDINGS: Stable cardiomegaly. No pneumothorax or pleural effusion is noted. Lungs are clear. Bony thorax is unremarkable. IMPRESSION: No active disease. Electronically Signed   By: Marijo Conception M.D.   On: 10/05/2020 08:27   CT Renal Stone Study  Result Date: 10/08/2020 CLINICAL DATA:  59 year old male with abdominal and flank pain. EXAM: CT ABDOMEN AND PELVIS WITHOUT CONTRAST TECHNIQUE: Multidetector CT imaging of the abdomen and pelvis was performed following the standard protocol without IV contrast. COMPARISON:  CTA Chest, Abdomen, and Pelvis 06/21/2020. FINDINGS: Lower chest: Stable cardiomegaly since July. Small layering pleural effusions, that on the right is slightly larger. Negative for lung base pneumonia. Hepatobiliary: Nodular, cirrhotic liver. No discrete liver lesion in the absence of contrast.  Negative gallbladder. Pancreas: Negative. Spleen: Diminutive and negative. Adrenals/Urinary Tract: Stable mild adrenal gland thickening. Negative noncontrast kidneys. Circumferential thickening of the urinary bladder (series 2, image 72) appears stable since July. No urinary calculus. Both ureters are decompressed and normal to the bladder. Stomach/Bowel: Redundant large bowel. No dilated large or small bowel loops. Normal appendix (coronal image 74). Negative terminal ileum. Trace free fluid in both gutters. No free air. Negative stomach. Decompressed duodenum. No convincing bowel inflammation. Vascular/Lymphatic: Aortoiliac calcified atherosclerosis. Vascular patency is not evaluated in the absence of IV contrast. No lymphadenopathy. Reproductive: Small volume of inguinal ligament fluid suspected, otherwise negative. Other: No pelvic free fluid. Musculoskeletal: No acute osseous abnormality identified. IMPRESSION: 1. Cirrhosis with trace ascites and small pleural effusions. Right pleural effusion mildly increased since July. 2. Chronic bladder wall thickening.  Cystitis, UTI not excluded. 3. No urinary calculus or obstructive uropathy. No other acute or inflammatory process in the non-contrast abdomen or pelvis. 4. Cardiomegaly.  Aortic Atherosclerosis (ICD10-I70.0). Electronically Signed   By: Genevie Ann M.D.   On: 10/08/2020 03:35    (Echo, Carotid, EGD, Colonoscopy, ERCP)    Subjective:  Awake alert resting in bed No complaints Discharge  Exam: Vitals:   10/11/20 2142 10/12/20 0459  BP: (!) 141/87 130/81  Pulse: (!) 58 63  Resp: 18 17  Temp: (!) 97.5 F (36.4 C) 98.1 F (36.7 C)  SpO2: 97% 97%   Vitals:   10/10/20 2239 10/11/20 0442 10/11/20 2142 10/12/20 0459  BP: 113/69 138/79 (!) 141/87 130/81  Pulse: 62 62 (!) 58 63  Resp: '16 16 18 17  ' Temp: 97.7 F (36.5 C) 97.8 F (36.6 C) (!) 97.5 F (36.4 C) 98.1 F (36.7 C)  TempSrc:   Oral   SpO2: 99% 99% 97% 97%  Weight:      Height:         General: Pt is alert, awake, not in acute distress Cardiovascular: RRR, S1/S2 +, no rubs, no gallops Respiratory: CTA bilaterally, no wheezing, no rhonchi Abdominal: Soft, NT, ND, bowel sounds + Extremities: no edema, no cyanosis    The results of significant diagnostics from this hospitalization (including imaging, microbiology, ancillary and laboratory) are listed below for reference.     Microbiology: Recent Results (from the past 240 hour(s))  Respiratory Panel by RT PCR (Flu A&B, Covid) - Nasopharyngeal Swab     Status: None   Collection Time: 10/08/20  3:14 AM   Specimen: Nasopharyngeal Swab  Result Value Ref Range Status   SARS Coronavirus 2 by RT PCR NEGATIVE NEGATIVE Final    Comment: (NOTE) SARS-CoV-2 target nucleic acids are NOT DETECTED.  The SARS-CoV-2 RNA is generally detectable in upper respiratoy specimens during the acute phase of infection. The lowest concentration of SARS-CoV-2 viral copies this assay can detect is 131 copies/mL. A negative result does not preclude SARS-Cov-2 infection and should not be used as the sole basis for treatment or other patient management decisions. A negative result may occur with  improper specimen collection/handling, submission of specimen other than nasopharyngeal swab, presence of viral mutation(s) within the areas targeted by this assay, and inadequate number of viral copies (<131 copies/mL). A negative result must be combined with clinical observations, patient history, and epidemiological information. The expected result is Negative.  Fact Sheet for Patients:  PinkCheek.be  Fact Sheet for Healthcare Providers:  GravelBags.it  This test is no t yet approved or cleared by the [place] FDA and  has been authorized for detection and/or diagnosis of SARS-CoV-2 by FDA under an Emergency Use Authorization (EUA). This EUA will remain  in effect (meaning this  test can be used) for the duration of the COVID-19 declaration under Section 564(b)(1) of the Act, 21 U.S.C. section 360bbb-3(b)(1), unless the authorization is terminated or revoked sooner.     Influenza A by PCR NEGATIVE NEGATIVE Final   Influenza B by PCR NEGATIVE NEGATIVE Final    Comment: (NOTE) The Xpert Xpress SARS-CoV-2/FLU/RSV assay is intended as an aid in  the diagnosis of influenza from Nasopharyngeal swab specimens and  should not be used as a sole basis for treatment. Nasal washings and  aspirates are unacceptable for Xpert Xpress SARS-CoV-2/FLU/RSV  testing.  Fact Sheet for Patients: PinkCheek.be  Fact Sheet for Healthcare Providers: GravelBags.it  This test is not yet approved or cleared by the [place] FDA and  has been authorized for detection and/or diagnosis of SARS-CoV-2 by  FDA under an Emergency Use Authorization (EUA). This EUA will remain  in effect (meaning this test can be used) for the duration of the  Covid-19 declaration under Section 564(b)(1) of the Act, 21  U.S.C. section 360bbb-3(b)(1), unless the authorization is  terminated  or revoked. Performed at [facility], [place] [address] [place], [place] [address]      Labs: BNP (last 3 results) Recent Labs    09/04/20 0649 10/05/20 0750 10/08/20 1340  BNP 494.9* 778.6* 250.5*   Basic Metabolic Panel: Recent Labs  Lab 10/09/20 0422 10/09/20 0912 10/09/20 1622 10/09/20 1622 10/10/20 0046 10/10/20 0046 10/10/20 0441 10/10/20 1100 10/11/20 0400 10/11/20 0402 10/11/20 1129 10/11/20 1350 10/11/20 2039 10/12/20 0456  NA 115*   < > 114*   < > 116*   < > 115*   < > 121*  --  121* 123* 126* 128*  K 4.7   < > 4.4  --  4.2  --  4.2  --   --   --   --  4.4  --  4.5  CL 86*   < > 90*  --  88*  --  91*  --   --   --   --  95*  --  101  CO2 24   < > 20*  --  20*  --  20*  --   --   --   --  21*  --  21*  GLUCOSE  127*   < > 107*  --  161*  --  135*  --   --   --   --  169*  --  129*  BUN 28*   < > 32*  --  28*  --  29*  --   --   --   --  21*  --  22*  CREATININE 1.54*   < > 1.53*  --  1.56*  --  1.47*  --   --   --   --  1.36*  --  1.29*  CALCIUM 7.9*   < > 7.8*  --  7.7*  --  7.4*  --   --   --   --  7.7*  --  8.0*  MG 2.1  --   --   --   --   --  2.2  --   --  2.0  --   --   --  2.1   < > = values in this interval not displayed.   Liver Function Tests: Recent Labs  Lab 10/08/20 0141 10/11/20 1129  AST 23 18  ALT 18 15  ALKPHOS 42 37*  BILITOT 0.8 0.5  PROT 6.2* 5.0*  ALBUMIN 2.9* 2.4*   Recent Labs  Lab 10/08/20 0141  LIPASE 31   No results for input(s): AMMONIA in the last 168 hours. CBC: Recent Labs  Lab 10/08/20 0141 10/09/20 0422 10/10/20 0441 10/11/20 0402 10/12/20 0456  WBC 4.5 4.1 3.4* 3.4* 4.0  NEUTROABS  --  2.7 2.2 2.2 2.7  HGB 12.0* 10.7* 10.2* 10.6* 10.6*  HCT 34.7* 30.6* 29.4* 31.7* 31.4*  MCV 85.3 83.8 84.2 87.6 87.2  PLT 219 188 167 180 169   Cardiac Enzymes: No results for input(s): CKTOTAL, CKMB, CKMBINDEX, TROPONINI in the last 168 hours. BNP: Invalid input(s): POCBNP CBG: Recent Labs  Lab 10/11/20 0817 10/11/20 1203 10/11/20 1715 10/11/20 2147 10/12/20 0730  GLUCAP 154* 115* 164* 118* 123*   D-Dimer No results for input(s): DDIMER in the last 72 hours. Hgb A1c No results for input(s): HGBA1C in the last 72 hours. Lipid Profile No results for input(s): CHOL, HDL, LDLCALC, TRIG, CHOLHDL, LDLDIRECT in the last 72 hours. Thyroid function studies No results for input(s): TSH, T4TOTAL,  T3FREE, THYROIDAB in the last 72 hours.  Invalid input(s): FREET3 Anemia work up No results for input(s): VITAMINB12, FOLATE, FERRITIN, TIBC, IRON, RETICCTPCT in the last 72 hours. Urinalysis    Component Value Date/Time   COLORURINE YELLOW 10/08/2020 0314   APPEARANCEUR CLEAR 10/08/2020 0314   LABSPEC 1.010 10/08/2020 0314   PHURINE 5.0 10/08/2020 0314    GLUCOSEU >=500 (A) 10/08/2020 0314   HGBUR SMALL (A) 10/08/2020 0314   BILIRUBINUR NEGATIVE 10/08/2020 0314   BILIRUBINUR neg 05/08/2017 1503   KETONESUR NEGATIVE 10/08/2020 0314   PROTEINUR 100 (A) 10/08/2020 0314   UROBILINOGEN 0.2 05/08/2017 1503   UROBILINOGEN 1.0 04/26/2012 1328   NITRITE NEGATIVE 10/08/2020 0314   LEUKOCYTESUR NEGATIVE 10/08/2020 0314   Sepsis Labs Invalid input(s): PROCALCITONIN,  WBC,  LACTICIDVEN Microbiology Recent Results (from the past 240 hour(s))  Respiratory Panel by RT PCR (Flu A&B, Covid) - Nasopharyngeal Swab     Status: None   Collection Time: 10/08/20  3:14 AM   Specimen: Nasopharyngeal Swab  Result Value Ref Range Status   SARS Coronavirus 2 by RT PCR NEGATIVE NEGATIVE Final    Comment: (NOTE) SARS-CoV-2 target nucleic acids are NOT DETECTED.  The SARS-CoV-2 RNA is generally detectable in upper respiratoy specimens during the acute phase of infection. The lowest concentration of SARS-CoV-2 viral copies this assay can detect is 131 copies/mL. A negative result does not preclude SARS-Cov-2 infection and should not be used as the sole basis for treatment or other patient management decisions. A negative result may occur with  improper specimen collection/handling, submission of specimen other than nasopharyngeal swab, presence of viral mutation(s) within the areas targeted by this assay, and inadequate number of viral copies (<131 copies/mL). A negative result must be combined with clinical observations, patient history, and epidemiological information. The expected result is Negative.  Fact Sheet for Patients:  PinkCheek.be  Fact Sheet for Healthcare Providers:  GravelBags.it  This test is no t yet approved or cleared by the [place] FDA and  has been authorized for detection and/or diagnosis of SARS-CoV-2 by FDA under an Emergency Use Authorization (EUA). This EUA will remain   in effect (meaning this test can be used) for the duration of the COVID-19 declaration under Section 564(b)(1) of the Act, 21 U.S.C. section 360bbb-3(b)(1), unless the authorization is terminated or revoked sooner.     Influenza A by PCR NEGATIVE NEGATIVE Final   Influenza B by PCR NEGATIVE NEGATIVE Final    Comment: (NOTE) The Xpert Xpress SARS-CoV-2/FLU/RSV assay is intended as an aid in  the diagnosis of influenza from Nasopharyngeal swab specimens and  should not be used as a sole basis for treatment. Nasal washings and  aspirates are unacceptable for Xpert Xpress SARS-CoV-2/FLU/RSV  testing.  Fact Sheet for Patients: PinkCheek.be  Fact Sheet for Healthcare Providers: GravelBags.it  This test is not yet approved or cleared by the [place] FDA and  has been authorized for detection and/or diagnosis of SARS-CoV-2 by  FDA under an Emergency Use Authorization (EUA). This EUA will remain  in effect (meaning this test can be used) for the duration of the  Covid-19 declaration under Section 564(b)(1) of the Act, 21  U.S.C. section 360bbb-3(b)(1), unless the authorization is  terminated or revoked. Performed at [facility], [place] [address] [place], [place] [address]      Time coordinating discharge: 38 minutes  SIGNED:   Georgette Shell, MD  [facility] 10/12/2020, 9:48 AM

## 2020-10-15 ENCOUNTER — Telehealth (HOSPITAL_COMMUNITY): Payer: Self-pay

## 2020-10-15 ENCOUNTER — Telehealth: Payer: Self-pay

## 2020-10-15 IMAGING — CR DG CHEST 2V
2 series · 2 of 2 positions shown · non-contrast
Comparison: PA and lateral chest 09/30/2019 and 08/21/2019. CT
chest 08/22/2019.

CLINICAL DATA: Onset chest pain yesterday.

EXAM:
CHEST - 2 VIEW

[chest pa]
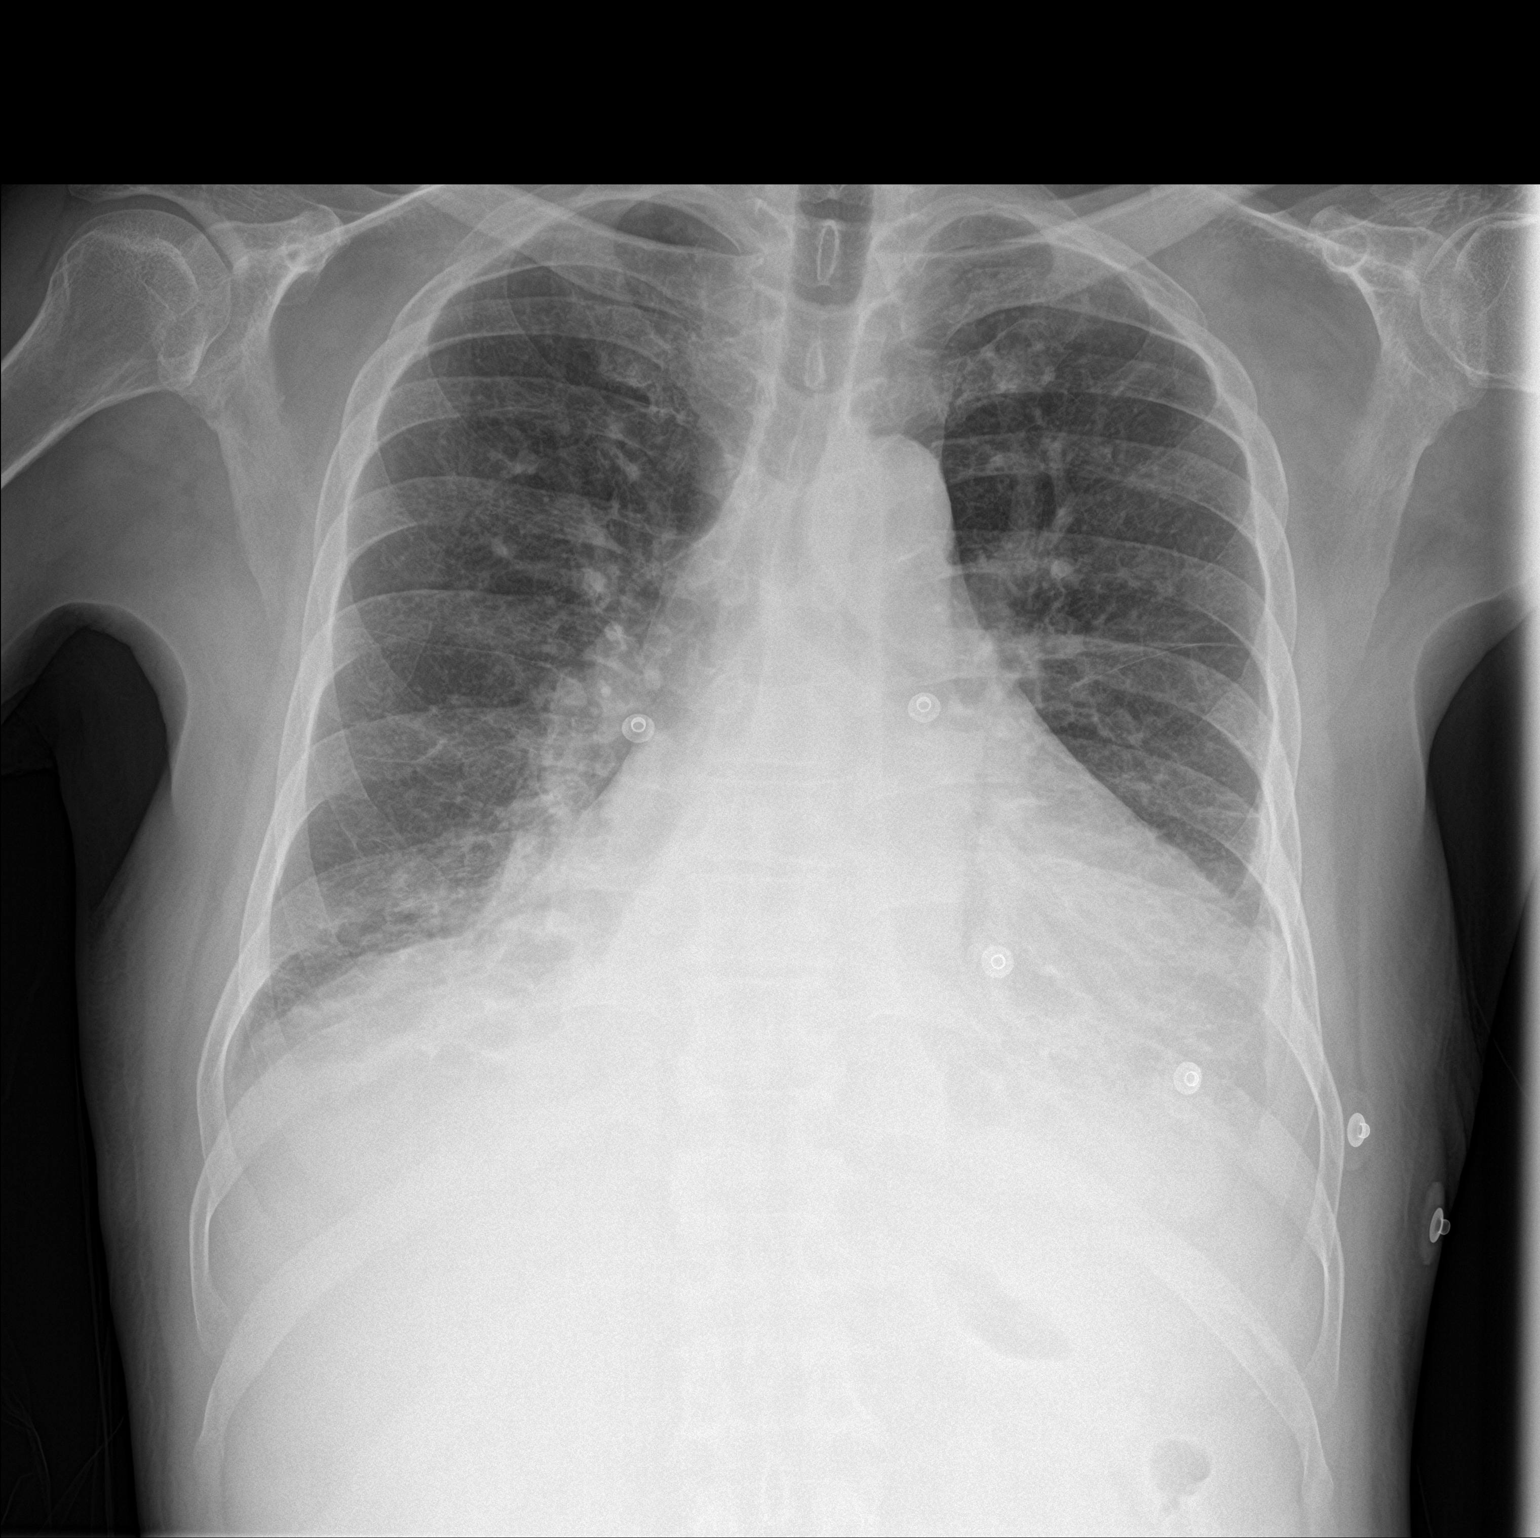

[chest lat]
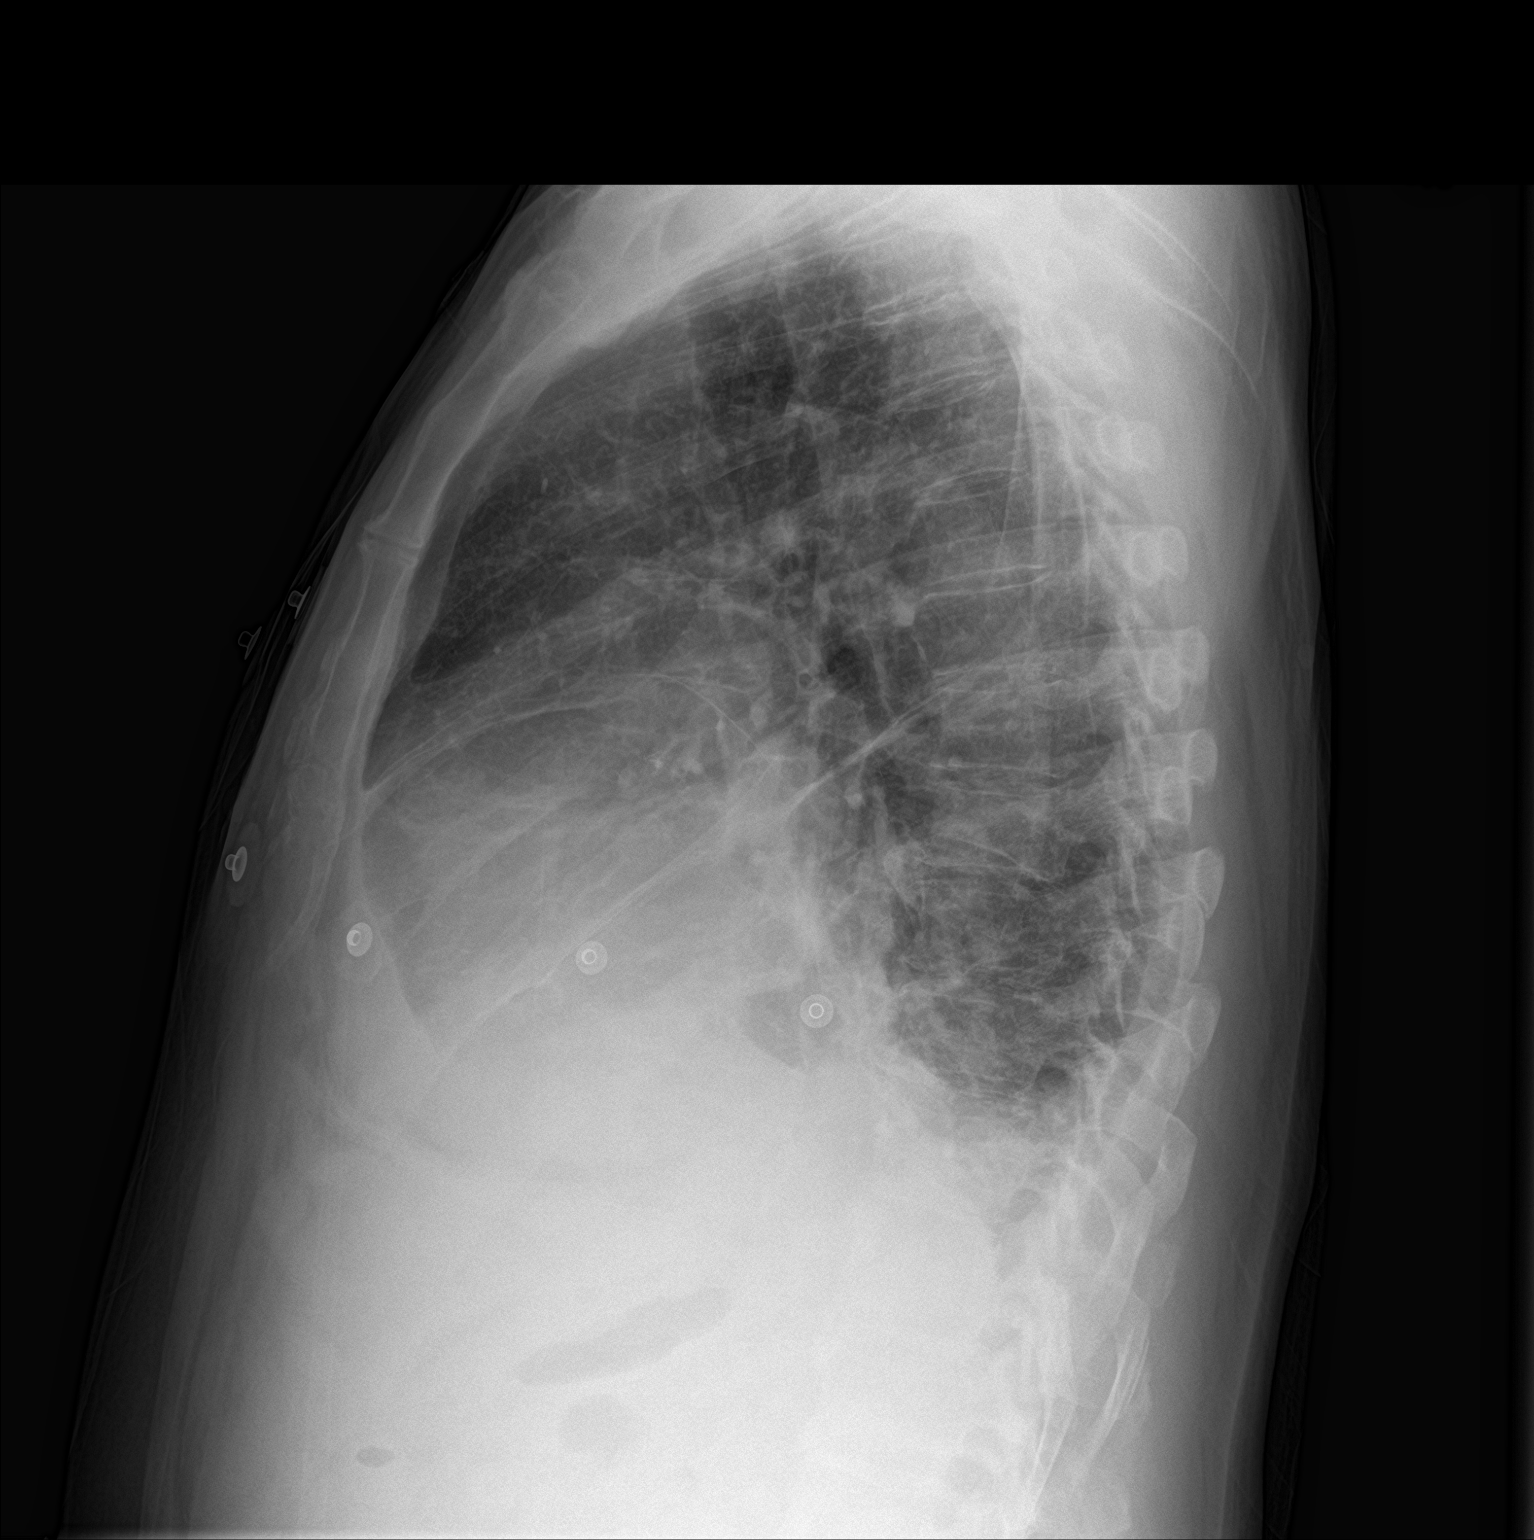

[2 of 2 positions shown; findings below may reference images not displayed]

FINDINGS: There is cardiomegaly. Small bilateral pleural effusions and basilar
atelectasis are noted. No consolidative process or pneumothorax. No
evidence of pulmonary edema.
IMPRESSION: Small bilateral pleural effusions and mild basilar atelectasis.

Cardiomegaly without edema.

## 2020-10-15 NOTE — Telephone Encounter (Signed)
Left message for Arif to return my call in reference to home visit for today. Will await returned call.

## 2020-10-15 NOTE — Telephone Encounter (Signed)
Transition Care Management Unsuccessful Follow-up Telephone Call  Date of discharge and from where:  10/12/2020, [facility]  Attempts:   call placed to patient # [phone], message left with call back requested to this CM.   Needs to schedule follow up appointment with PCP

## 2020-10-16 ENCOUNTER — Telehealth: Payer: Self-pay

## 2020-10-16 NOTE — Telephone Encounter (Signed)
Transition Care Management Unsuccessful Follow-up Telephone Call  Date of discharge and from where:  10/12/2020, [facility]   Attempts:  2nd Attempt  Reason for unsuccessful TCM follow-up call:  Left voice message - call placed to patient # [phone].  He needs to schedule follow up appointment with PCP. Will discuss with [name], EMT when she sees the patient.

## 2020-10-17 ENCOUNTER — Emergency Department (HOSPITAL_COMMUNITY): Payer: Medicaid Other

## 2020-10-17 ENCOUNTER — Other Ambulatory Visit: Payer: Self-pay

## 2020-10-17 ENCOUNTER — Encounter (HOSPITAL_COMMUNITY): Payer: Self-pay

## 2020-10-17 ENCOUNTER — Inpatient Hospital Stay (HOSPITAL_COMMUNITY)
Admission: EM | Admit: 2020-10-17 | Discharge: 2020-10-22 | DRG: 917 | Disposition: A | Discharge status: Home / Self Care | Payer: Medicaid Other | Attending: Family Medicine | Admitting: Family Medicine

## 2020-10-17 DIAGNOSIS — Z888 Allergy status to other drugs, medicaments and biological substances status: Secondary | ICD-10-CM

## 2020-10-17 DIAGNOSIS — E871 Hypo-osmolality and hyponatremia: Secondary | ICD-10-CM | POA: Diagnosis present

## 2020-10-17 DIAGNOSIS — E861 Hypovolemia: Secondary | ICD-10-CM | POA: Diagnosis not present

## 2020-10-17 DIAGNOSIS — I509 Heart failure, unspecified: Secondary | ICD-10-CM

## 2020-10-17 DIAGNOSIS — I130 Hypertensive heart and chronic kidney disease with heart failure and stage 1 through stage 4 chronic kidney disease, or unspecified chronic kidney disease: Secondary | ICD-10-CM | POA: Diagnosis present

## 2020-10-17 DIAGNOSIS — F17210 Nicotine dependence, cigarettes, uncomplicated: Secondary | ICD-10-CM | POA: Diagnosis present

## 2020-10-17 DIAGNOSIS — I5043 Acute on chronic combined systolic (congestive) and diastolic (congestive) heart failure: Secondary | ICD-10-CM | POA: Diagnosis present

## 2020-10-17 DIAGNOSIS — T501X5A Adverse effect of loop [high-ceiling] diuretics, initial encounter: Secondary | ICD-10-CM | POA: Diagnosis not present

## 2020-10-17 DIAGNOSIS — N1831 Chronic kidney disease, stage 3a: Secondary | ICD-10-CM | POA: Diagnosis present

## 2020-10-17 DIAGNOSIS — I482 Chronic atrial fibrillation, unspecified: Secondary | ICD-10-CM | POA: Diagnosis present

## 2020-10-17 DIAGNOSIS — E1140 Type 2 diabetes mellitus with diabetic neuropathy, unspecified: Secondary | ICD-10-CM | POA: Diagnosis present

## 2020-10-17 DIAGNOSIS — E7800 Pure hypercholesterolemia, unspecified: Secondary | ICD-10-CM | POA: Diagnosis present

## 2020-10-17 DIAGNOSIS — R079 Chest pain, unspecified: Secondary | ICD-10-CM | POA: Diagnosis present

## 2020-10-17 DIAGNOSIS — Z803 Family history of malignant neoplasm of breast: Secondary | ICD-10-CM

## 2020-10-17 DIAGNOSIS — E1122 Type 2 diabetes mellitus with diabetic chronic kidney disease: Secondary | ICD-10-CM | POA: Diagnosis present

## 2020-10-17 DIAGNOSIS — Z79899 Other long term (current) drug therapy: Secondary | ICD-10-CM

## 2020-10-17 DIAGNOSIS — K7030 Alcoholic cirrhosis of liver without ascites: Secondary | ICD-10-CM | POA: Diagnosis present

## 2020-10-17 DIAGNOSIS — E785 Hyperlipidemia, unspecified: Secondary | ICD-10-CM | POA: Diagnosis present

## 2020-10-17 DIAGNOSIS — Z8041 Family history of malignant neoplasm of ovary: Secondary | ICD-10-CM

## 2020-10-17 DIAGNOSIS — E860 Dehydration: Secondary | ICD-10-CM | POA: Diagnosis present

## 2020-10-17 DIAGNOSIS — K219 Gastro-esophageal reflux disease without esophagitis: Secondary | ICD-10-CM | POA: Diagnosis present

## 2020-10-17 DIAGNOSIS — N179 Acute kidney failure, unspecified: Secondary | ICD-10-CM | POA: Diagnosis not present

## 2020-10-17 DIAGNOSIS — I252 Old myocardial infarction: Secondary | ICD-10-CM

## 2020-10-17 DIAGNOSIS — N183 Chronic kidney disease, stage 3 unspecified: Secondary | ICD-10-CM | POA: Diagnosis present

## 2020-10-17 DIAGNOSIS — M1990 Unspecified osteoarthritis, unspecified site: Secondary | ICD-10-CM | POA: Diagnosis present

## 2020-10-17 DIAGNOSIS — Z20822 Contact with and (suspected) exposure to covid-19: Secondary | ICD-10-CM | POA: Diagnosis present

## 2020-10-17 DIAGNOSIS — I428 Other cardiomyopathies: Secondary | ICD-10-CM | POA: Diagnosis present

## 2020-10-17 DIAGNOSIS — K766 Portal hypertension: Secondary | ICD-10-CM | POA: Diagnosis present

## 2020-10-17 DIAGNOSIS — F1010 Alcohol abuse, uncomplicated: Secondary | ICD-10-CM | POA: Diagnosis present

## 2020-10-17 DIAGNOSIS — G253 Myoclonus: Secondary | ICD-10-CM | POA: Diagnosis present

## 2020-10-17 DIAGNOSIS — I4821 Permanent atrial fibrillation: Secondary | ICD-10-CM | POA: Diagnosis present

## 2020-10-17 DIAGNOSIS — Z8546 Personal history of malignant neoplasm of prostate: Secondary | ICD-10-CM

## 2020-10-17 DIAGNOSIS — Z8249 Family history of ischemic heart disease and other diseases of the circulatory system: Secondary | ICD-10-CM

## 2020-10-17 DIAGNOSIS — E872 Acidosis: Secondary | ICD-10-CM | POA: Diagnosis present

## 2020-10-17 DIAGNOSIS — Z96652 Presence of left artificial knee joint: Secondary | ICD-10-CM | POA: Diagnosis present

## 2020-10-17 DIAGNOSIS — R0602 Shortness of breath: Secondary | ICD-10-CM | POA: Diagnosis not present

## 2020-10-17 DIAGNOSIS — Z981 Arthrodesis status: Secondary | ICD-10-CM

## 2020-10-17 DIAGNOSIS — T405X1A Poisoning by cocaine, accidental (unintentional), initial encounter: Principal | ICD-10-CM | POA: Diagnosis present

## 2020-10-17 LAB — COMPREHENSIVE METABOLIC PANEL
ALT: 31 U/L (ref 0–44)
AST: 36 U/L (ref 15–41)
Albumin: 2.7 g/dL — ABNORMAL LOW (ref 3.5–5.0)
Alkaline Phosphatase: 55 U/L (ref 38–126)
Anion gap: 16 — ABNORMAL HIGH (ref 5–15)
BUN: 9 mg/dL (ref 6–20)
CO2: 14 mmol/L — ABNORMAL LOW (ref 22–32)
Calcium: 8.1 mg/dL — ABNORMAL LOW (ref 8.9–10.3)
Chloride: 89 mmol/L — ABNORMAL LOW (ref 98–111)
Creatinine, Ser: 1.05 mg/dL (ref 0.61–1.24)
GFR, Estimated: >60 mL/min (ref >60)
Glucose, Bld: 129 mg/dL — ABNORMAL HIGH (ref 70–99)
Potassium: 4.4 mmol/L (ref 3.5–5.1)
Sodium: 119 mmol/L — CL (ref 135–145)
Total Bilirubin: 0.7 mg/dL (ref 0.3–1.2)
Total Protein: 6.3 g/dL — ABNORMAL LOW (ref 6.5–8.1)

## 2020-10-17 LAB — URINALYSIS, ROUTINE W REFLEX MICROSCOPIC
Bacteria, UA: NONE SEEN
Bilirubin Urine: NEGATIVE
Glucose, UA: >=500 mg/dL — AB
Ketones, ur: NEGATIVE mg/dL
Leukocytes,Ua: NEGATIVE
Nitrite: NEGATIVE
Protein, ur: 100 mg/dL — AB
Specific Gravity, Urine: 1.012 (ref 1.005–1.030)
pH: 5 (ref 5.0–8.0)

## 2020-10-17 LAB — CBC WITH DIFFERENTIAL/PLATELET
Abs Immature Granulocytes: 0.01 10*3/uL (ref 0.00–0.07)
Basophils Absolute: 0 10*3/uL (ref 0.0–0.1)
Basophils Relative: 1 %
Eosinophils Absolute: 0 10*3/uL (ref 0.0–0.5)
Eosinophils Relative: 1 %
HCT: 41.9 % (ref 39.0–52.0)
Hemoglobin: 13.5 g/dL (ref 13.0–17.0)
Immature Granulocytes: 0 %
Lymphocytes Relative: 8 %
Lymphs Abs: 0.3 10*3/uL — ABNORMAL LOW (ref 0.7–4.0)
MCH: 28.2 pg (ref 26.0–34.0)
MCHC: 32.2 g/dL (ref 30.0–36.0)
MCV: 87.5 fL (ref 80.0–100.0)
Monocytes Absolute: 0.7 10*3/uL (ref 0.1–1.0)
Monocytes Relative: 19 %
Neutro Abs: 2.6 10*3/uL (ref 1.7–7.7)
Neutrophils Relative %: 71 %
Platelets: 324 10*3/uL (ref 150–400)
RBC: 4.79 MIL/uL (ref 4.22–5.81)
RDW: 15.6 % — ABNORMAL HIGH (ref 11.5–15.5)
WBC: 3.6 10*3/uL — ABNORMAL LOW (ref 4.0–10.5)
nRBC: 0 % (ref 0.0–0.2)

## 2020-10-17 LAB — RAPID URINE DRUG SCREEN, HOSP PERFORMED
Amphetamines: NOT DETECTED
Barbiturates: NOT DETECTED
Benzodiazepines: NOT DETECTED
Cocaine: POSITIVE — AB
Opiates: NOT DETECTED
Tetrahydrocannabinol: NOT DETECTED

## 2020-10-17 LAB — OSMOLALITY: Osmolality: 269 mOsm/kg — ABNORMAL LOW (ref 275–295)

## 2020-10-17 LAB — LIPASE, BLOOD: Lipase: 27 U/L (ref 11–51)

## 2020-10-17 LAB — SODIUM, URINE, RANDOM: Sodium, Ur: 15 mmol/L

## 2020-10-17 LAB — TROPONIN I (HIGH SENSITIVITY)
Troponin I (High Sensitivity): 36 ng/L — ABNORMAL HIGH (ref <18)
Troponin I (High Sensitivity): 31 ng/L — ABNORMAL HIGH (ref <18)

## 2020-10-17 LAB — CREATININE, URINE, RANDOM: Creatinine, Urine: 48.06 mg/dL

## 2020-10-17 LAB — OSMOLALITY, URINE: Osmolality, Ur: 378 mOsm/kg (ref 300–900)

## 2020-10-17 LAB — BRAIN NATRIURETIC PEPTIDE: B Natriuretic Peptide: 1190 pg/mL — ABNORMAL HIGH (ref 0.0–100.0)

## 2020-10-17 MED ORDER — FUROSEMIDE 10 MG/ML IJ SOLN
20.0000 mg | Freq: Once | INTRAMUSCULAR | Status: AC
  Administered 2020-10-17: 20 mg via INTRAVENOUS
  Filled 2020-10-17: qty 2

## 2020-10-17 MED ORDER — SODIUM CHLORIDE 0.9 % IV SOLN
1.0000 g | Freq: Once | INTRAVENOUS | Status: AC
  Administered 2020-10-17: 1 g via INTRAVENOUS
  Filled 2020-10-17: qty 10

## 2020-10-17 MED ORDER — SODIUM CHLORIDE 0.9 % IV SOLN
500.0000 mg | Freq: Once | INTRAVENOUS | Status: AC
  Administered 2020-10-17: 500 mg via INTRAVENOUS
  Filled 2020-10-17: qty 500

## 2020-10-17 MED ORDER — GABAPENTIN 300 MG PO CAPS
300.0000 mg | ORAL_CAPSULE | Freq: Once | ORAL | Status: AC
  Administered 2020-10-17: 300 mg via ORAL
  Filled 2020-10-17: qty 1

## 2020-10-17 MED ORDER — FENTANYL CITRATE (PF) 100 MCG/2ML IJ SOLN
50.0000 ug | Freq: Once | INTRAMUSCULAR | Status: AC
  Administered 2020-10-17: 50 ug via INTRAVENOUS
  Filled 2020-10-17: qty 2

## 2020-10-17 NOTE — ED Triage Notes (Signed)
Per [facility] pt coming from home reporting chaf exacerbation, pt congested and lung sounds minimal. spo2 95% on room air. Pitting noted on bilateral arms, legs, and ABD. Denies covid exposure, dry cough. rr 26, 160/100, hr 70 with afib and pvcs, cbg 153. Nasal cap 30.

## 2020-10-17 NOTE — ED Notes (Signed)
Pt aao4, gcs 15, reporting generalized body aches and not feeling well. Obvious rhonchi heard and pt has labored  Breathing. Pitting edema noted to all extremities.  Denies chest pain, denies abd pain. States this feels like chf exacerbations. Sinus rhythm on monitor, side rails up, call bell in reach.

## 2020-10-17 NOTE — ED Notes (Signed)
Pa notified of critical sodium

## 2020-10-17 NOTE — ED Notes (Signed)
Pt on bedside commode.

## 2020-10-17 NOTE — ED Notes (Signed)
Pa  at bedside. 
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
## 2020-10-17 NOTE — ED Notes (Signed)
Bedside commode provided

## 2020-10-18 ENCOUNTER — Encounter (HOSPITAL_COMMUNITY): Payer: Self-pay | Admitting: Internal Medicine

## 2020-10-18 ENCOUNTER — Inpatient Hospital Stay (HOSPITAL_COMMUNITY): Payer: Medicaid Other

## 2020-10-18 DIAGNOSIS — I5043 Acute on chronic combined systolic (congestive) and diastolic (congestive) heart failure: Secondary | ICD-10-CM | POA: Diagnosis not present

## 2020-10-18 LAB — BASIC METABOLIC PANEL
Anion gap: 10 (ref 5–15)
Anion gap: 12 (ref 5–15)
BUN: 9 mg/dL (ref 6–20)
BUN: 10 mg/dL (ref 6–20)
CO2: 20 mmol/L — ABNORMAL LOW (ref 22–32)
CO2: 20 mmol/L — ABNORMAL LOW (ref 22–32)
Calcium: 7.8 mg/dL — ABNORMAL LOW (ref 8.9–10.3)
Calcium: 7.8 mg/dL — ABNORMAL LOW (ref 8.9–10.3)
Chloride: 91 mmol/L — ABNORMAL LOW (ref 98–111)
Chloride: 89 mmol/L — ABNORMAL LOW (ref 98–111)
Creatinine, Ser: 1.09 mg/dL (ref 0.61–1.24)
Creatinine, Ser: 1.24 mg/dL (ref 0.61–1.24)
GFR, Estimated: >60 mL/min (ref >60)
GFR, Estimated: >60 mL/min (ref >60)
Glucose, Bld: 96 mg/dL (ref 70–99)
Glucose, Bld: 107 mg/dL — ABNORMAL HIGH (ref 70–99)
Potassium: 4.2 mmol/L (ref 3.5–5.1)
Potassium: 4.3 mmol/L (ref 3.5–5.1)
Sodium: 121 mmol/L — ABNORMAL LOW (ref 135–145)
Sodium: 121 mmol/L — ABNORMAL LOW (ref 135–145)

## 2020-10-18 LAB — CBG MONITORING, ED
Glucose-Capillary: 115 mg/dL — ABNORMAL HIGH (ref 70–99)
Glucose-Capillary: 110 mg/dL — ABNORMAL HIGH (ref 70–99)

## 2020-10-18 LAB — GLUCOSE, CAPILLARY
Glucose-Capillary: 116 mg/dL — ABNORMAL HIGH (ref 70–99)
Glucose-Capillary: 140 mg/dL — ABNORMAL HIGH (ref 70–99)

## 2020-10-18 LAB — RESP PANEL BY RT PCR (RSV, FLU A&B, COVID)
Influenza A by PCR: NEGATIVE
Influenza B by PCR: NEGATIVE
Respiratory Syncytial Virus by PCR: NEGATIVE
SARS Coronavirus 2 by RT PCR: NEGATIVE

## 2020-10-18 LAB — SALICYLATE LEVEL: Salicylate Lvl: <7 mg/dL — ABNORMAL LOW (ref 7.0–30.0)

## 2020-10-18 LAB — HEMOGLOBIN A1C
Hgb A1c MFr Bld: 6.6 % — ABNORMAL HIGH (ref 4.8–5.6)
Mean Plasma Glucose: 143 mg/dL

## 2020-10-18 LAB — ETHANOL: Alcohol, Ethyl (B): <10 mg/dL (ref <10)

## 2020-10-18 LAB — PROCALCITONIN: Procalcitonin: <0.1 ng/mL

## 2020-10-18 LAB — LACTIC ACID, PLASMA: Lactic Acid, Venous: 1.2 mmol/L (ref 0.5–1.9)

## 2020-10-18 MED ORDER — FOLIC ACID 1 MG PO TABS
1.0000 mg | ORAL_TABLET | Freq: Every day | ORAL | Status: DC
  Administered 2020-10-18 – 2020-10-22 (×5): 1 mg via ORAL
  Filled 2020-10-18 (×5): qty 1

## 2020-10-18 MED ORDER — GABAPENTIN 300 MG PO CAPS
600.0000 mg | ORAL_CAPSULE | Freq: Two times a day (BID) | ORAL | Status: DC
  Administered 2020-10-18 – 2020-10-22 (×9): 600 mg via ORAL
  Filled 2020-10-18 (×10): qty 2

## 2020-10-18 MED ORDER — SODIUM CHLORIDE 1 G PO TABS
2.0000 g | ORAL_TABLET | Freq: Three times a day (TID) | ORAL | Status: DC
  Administered 2020-10-18 – 2020-10-22 (×13): 2 g via ORAL
  Filled 2020-10-18 (×15): qty 2

## 2020-10-18 MED ORDER — SODIUM CHLORIDE 0.9 % IV SOLN
500.0000 mg | INTRAVENOUS | Status: DC
  Administered 2020-10-18: 500 mg via INTRAVENOUS
  Filled 2020-10-18 (×3): qty 500

## 2020-10-18 MED ORDER — ADULT MULTIVITAMIN W/MINERALS CH
1.0000 | ORAL_TABLET | Freq: Every day | ORAL | Status: DC
  Administered 2020-10-18 – 2020-10-22 (×5): 1 via ORAL
  Filled 2020-10-18 (×5): qty 1

## 2020-10-18 MED ORDER — ENOXAPARIN SODIUM 40 MG/0.4ML ~~LOC~~ SOLN
40.0000 mg | SUBCUTANEOUS | Status: DC
  Administered 2020-10-18 – 2020-10-21 (×4): 40 mg via SUBCUTANEOUS
  Filled 2020-10-18 (×4): qty 0.4

## 2020-10-18 MED ORDER — DULOXETINE HCL 60 MG PO CPEP
60.0000 mg | ORAL_CAPSULE | Freq: Every day | ORAL | Status: DC
  Administered 2020-10-18 – 2020-10-22 (×5): 60 mg via ORAL
  Filled 2020-10-18 (×5): qty 1

## 2020-10-18 MED ORDER — LORAZEPAM 1 MG PO TABS
1.0000 mg | ORAL_TABLET | ORAL | Status: DC | PRN
  Administered 2020-10-18 – 2020-10-20 (×12): 1 mg via ORAL
  Filled 2020-10-18 (×14): qty 1

## 2020-10-18 MED ORDER — SODIUM CHLORIDE 0.9 % IV SOLN
2.0000 g | INTRAVENOUS | Status: DC
  Administered 2020-10-19 – 2020-10-20 (×3): 2 g via INTRAVENOUS
  Filled 2020-10-18 (×4): qty 20

## 2020-10-18 MED ORDER — THIAMINE HCL 100 MG PO TABS
100.0000 mg | ORAL_TABLET | Freq: Every day | ORAL | Status: DC
  Administered 2020-10-18 – 2020-10-22 (×5): 100 mg via ORAL
  Filled 2020-10-18 (×5): qty 1

## 2020-10-18 MED ORDER — ACETAMINOPHEN 650 MG RE SUPP: 650.0000 mg | Freq: Four times a day (QID) | RECTAL | Status: DC | PRN

## 2020-10-18 MED ORDER — INSULIN ASPART 100 UNIT/ML ~~LOC~~ SOLN
0.0000 [IU] | Freq: Three times a day (TID) | SUBCUTANEOUS | Status: DC
  Administered 2020-10-19 (×3): 1 [IU] via SUBCUTANEOUS
  Administered 2020-10-20: 2 [IU] via SUBCUTANEOUS
  Administered 2020-10-21 – 2020-10-22 (×3): 1 [IU] via SUBCUTANEOUS

## 2020-10-18 MED ORDER — DAPAGLIFLOZIN PROPANEDIOL 5 MG PO TABS
5.0000 mg | ORAL_TABLET | Freq: Every day | ORAL | Status: DC
  Administered 2020-10-18 – 2020-10-19 (×2): 5 mg via ORAL
  Filled 2020-10-18 (×2): qty 1

## 2020-10-18 MED ORDER — LORAZEPAM 2 MG/ML IJ SOLN
1.0000 mg | INTRAMUSCULAR | Status: DC | PRN
  Administered 2020-10-18: 2 mg via INTRAVENOUS
  Filled 2020-10-18: qty 1

## 2020-10-18 MED ORDER — INSULIN ASPART 100 UNIT/ML ~~LOC~~ SOLN
0.0000 [IU] | Freq: Every day | SUBCUTANEOUS | Status: DC
  Administered 2020-10-19: 2 [IU] via SUBCUTANEOUS

## 2020-10-18 MED ORDER — MORPHINE SULFATE (PF) 2 MG/ML IV SOLN: 1.0000 mg | Freq: Once | INTRAVENOUS | Status: DC | PRN

## 2020-10-18 MED ORDER — FOLIC ACID 1 MG PO TABS: 1.0000 mg | ORAL_TABLET | Freq: Every day | ORAL | Status: DC

## 2020-10-18 MED ORDER — ACETAMINOPHEN 325 MG PO TABS
650.0000 mg | ORAL_TABLET | Freq: Four times a day (QID) | ORAL | Status: DC | PRN
  Administered 2020-10-18 – 2020-10-19 (×2): 650 mg via ORAL
  Filled 2020-10-18 (×4): qty 2

## 2020-10-18 MED ORDER — MORPHINE SULFATE (PF) 2 MG/ML IV SOLN
1.0000 mg | INTRAVENOUS | Status: DC | PRN
  Administered 2020-10-18 – 2020-10-20 (×10): 1 mg via INTRAVENOUS
  Filled 2020-10-18 (×10): qty 1

## 2020-10-18 MED ORDER — ISOSORB DINITRATE-HYDRALAZINE 20-37.5 MG PO TABS
1.0000 | ORAL_TABLET | Freq: Three times a day (TID) | ORAL | Status: DC
  Administered 2020-10-18 – 2020-10-22 (×13): 1 via ORAL
  Filled 2020-10-18 (×16): qty 1

## 2020-10-18 MED ORDER — FUROSEMIDE 10 MG/ML IJ SOLN
40.0000 mg | Freq: Two times a day (BID) | INTRAMUSCULAR | Status: DC
  Administered 2020-10-18 – 2020-10-19 (×3): 40 mg via INTRAVENOUS
  Filled 2020-10-18 (×3): qty 4

## 2020-10-18 MED ORDER — AMIODARONE HCL 200 MG PO TABS
200.0000 mg | ORAL_TABLET | Freq: Every day | ORAL | Status: DC
  Administered 2020-10-18 – 2020-10-22 (×5): 200 mg via ORAL
  Filled 2020-10-18 (×5): qty 1

## 2020-10-18 MED ORDER — THIAMINE HCL 100 MG/ML IJ SOLN: 100.0000 mg | Freq: Every day | INTRAMUSCULAR | Status: DC

## 2020-10-18 MED ORDER — THIAMINE HCL 100 MG PO TABS: 100.0000 mg | ORAL_TABLET | Freq: Every day | ORAL | Status: DC

## 2020-10-18 MED ORDER — POTASSIUM CHLORIDE CRYS ER 20 MEQ PO TBCR
20.0000 meq | EXTENDED_RELEASE_TABLET | Freq: Every day | ORAL | Status: DC
  Administered 2020-10-18 – 2020-10-22 (×5): 20 meq via ORAL
  Filled 2020-10-18 (×5): qty 1

## 2020-10-18 NOTE — Evaluation (Signed)
Physical Therapy Evaluation Patient Details Name: Brad [name] MRN: [id number] DOB: 06-17-1961 Today's Date: 10/18/2020   History of Present Illness  59 y.o. male who presented to the ED on 10/17/2020 with multiple complaints including progressive weakness, frequent muscle jerks, worsening swelling and diarrhea. PMH significant for T2DM, HTN, HLD, permanent A. Fib, chronic combined systolic and diastolic CHF, CKD stage III, polysubstance abuse, hep C, alcoholic liver cirrhosis, depression, GERD. PT with recurrent admissions for hyponatremia 2/2 alcohol abuse. Pt admitted for CHF exacerbation, hyponatremia, and myocolonal jerking.  Clinical Impression  Pt presents to PT with deficits in gait, balance, and safety awareness. Pt with significant drift and sway with ambulation as well as L foot drag, culminating in a loss of balance requiring multiple steps and PT assistance to correct for. Pt reports recent dose of IV morphine greatly affects his gait during evaluation, reporting some dizziness. Pt will benefit from continued acute PT POC to improve mobility quality and to reduce falls risk. PT anticipates the pt will quickly return toward baseline and recommends discharge home with outpatient PT services, no current DME needs.    Follow Up Recommendations Outpatient PT (balance)    Equipment Recommendations  None recommended by PT (pt owns necessary DME)    Recommendations for Other Services       Precautions / Restrictions Precautions Precautions: Fall Restrictions Weight Bearing Restrictions: No      Mobility  Bed Mobility               General bed mobility comments: not observed during session    Transfers Overall transfer level: Needs assistance Equipment used: None Transfers: Sit to/from Stand Sit to Stand: Supervision            Ambulation/Gait Ambulation/Gait assistance: Min assist Gait Distance (Feet): 100 Feet Assistive device: None Gait  Pattern/deviations: Step-through pattern Gait velocity: reduced Gait velocity interpretation: 1.31 - 2.62 ft/sec, indicative of limited community ambulator General Gait Details: pt with slowed step through gait, L foot drag noted, increased lateral drift and sway with one lateral LOB requiring stepping strategy and PT assist to correct  Stairs            Wheelchair Mobility    Modified [name] (Stroke Patients Only)       Balance Overall balance assessment: Needs assistance Sitting-balance support: Feet supported;No upper extremity supported Sitting balance-Leahy Scale: Good     Standing balance support: No upper extremity supported;During functional activity Standing balance-Leahy Scale: Poor Standing balance comment: minG without UE support                             Pertinent Vitals/Pain Pain Assessment: 0-10 Pain Score: 9  Pain Location: stomach Pain Descriptors / Indicators: Aching Pain Intervention(s): Premedicated before session    Home Living Family/patient expects to be discharged to:: Private residence Living Arrangements: Non-relatives/Friends Available Help at Discharge: Friend(s);Available PRN/intermittently Type of Home: Apartment Home Access: Level entry     Home Layout: One level Home Equipment: Walker - 2 wheels;Cane - single point Additional Comments: Pt reports using SPC in community, spends time sitting on porch. Pt reports independent with cooking, cleaning, bathing dressing.    Prior Function Level of Independence: Independent with assistive device(s)               Hand Dominance   Dominant Hand: Right    Extremity/Trunk Assessment   Upper Extremity Assessment Upper Extremity Assessment: Overall WFL for tasks assessed  Lower Extremity Assessment Lower Extremity Assessment: Overall WFL for tasks assessed    Cervical / Trunk Assessment Cervical / Trunk Assessment: Normal  Communication   Communication: No  difficulties  Cognition Arousal/Alertness: Awake/alert Behavior During Therapy: WFL for tasks assessed/performed Overall Cognitive Status: Within Functional Limits for tasks assessed                                        General Comments General comments (skin integrity, edema, etc.): VSS on RA    Exercises     Assessment/Plan    PT Assessment Patient needs continued PT services  PT Problem List Decreased balance;Decreased mobility;Decreased safety awareness       PT Treatment Interventions DME instruction;Gait training;Functional mobility training;Balance training;Neuromuscular re-education;Patient/family education    PT Goals (Current goals can be found in the Care Plan section)  Acute Rehab PT Goals Patient Stated Goal: To go home PT Goal Formulation: With patient Time For Goal Achievement: 11/01/20 Potential to Achieve Goals: Good Additional Goals Additional Goal #1: Pt will score >19/24 on DGI to indicate a reduced risk of falls    Frequency Min 3X/week   Barriers to discharge        Co-evaluation               AM-PAC PT "6 Clicks" Mobility  Outcome Measure Help needed turning from your back to your side while in a flat bed without using bedrails?: None Help needed moving from lying on your back to sitting on the side of a flat bed without using bedrails?: None Help needed moving to and from a bed to a chair (including a wheelchair)?: None Help needed standing up from a chair using your arms (e.g., wheelchair or bedside chair)?: None Help needed to walk in hospital room?: A Little Help needed climbing 3-5 steps with a railing? : A Lot 6 Click Score: 21    End of Session   Activity Tolerance: Patient tolerated treatment well Patient left: in bed;with call bell/phone within reach;with bed alarm set Nurse Communication: Mobility status PT Visit Diagnosis: Unsteadiness on feet (R26.81);Other abnormalities of gait and mobility (R26.89)     Time: 8242-3536 PT Time Calculation (min) (ACUTE ONLY): 14 min   Charges:   PT Evaluation $PT Eval Moderate Complexity: 1 Mod          Zenaida Niece, PT, DPT Acute Rehabilitation Pager: [phone]   Zenaida Niece 10/18/2020, 5:20 PM

## 2020-10-18 NOTE — ED Notes (Signed)
Pt on bedside commode with diarrhea.

## 2020-10-18 NOTE — ED Notes (Signed)
Text page sent to admitting MD for pain medication

## 2020-10-18 NOTE — ED Notes (Signed)
Pt reporting abd pain after antibiotic administration, asking for pain medication at this time.

## 2020-10-18 NOTE — Progress Notes (Signed)
PROGRESS NOTE  Brad [name]  DOB: 06/22/1961  PCP: Charlott Rakes, MD GEX:[id number]  DOA: 10/17/2020  LOS: 1 day   Chief Complaint  Patient presents with  . Shortness of Breath  . Cough  . Congestive Heart Failure   Brief narrative: Brad [name] is a 59 y.o. male who presented to the ED on 10/17/2020 with multiple complaints including progressive weakness, frequent muscle jerks, worsening swelling and diarrhea PMH significant for T2DM, HTN, HLD, permanent A. Fib, chronic combined systolic and diastolic CHF, CKD stage III, polysubstance abuse, hep C, alcoholic liver cirrhosis, depression, GERD. He was recently hospitalized 10/18-10/22 for CHF exacerbation and hyponatremia. He required IV Lasix, hypertonic saline and tolvaptan at that time patient. He has had multiple prior admissions for recurrent hyponatremia thought to be due to alcohol abuse, liver cirrhosis and portal hypertension.  Patient continues to drink 2 cans of beer daily. Patient initially denied cocaine or illicit drug use but later stated he smoked a joint recently and thinks somebody might have added cocaine to it without his knowledge.  UDS positive for cocaine.  Since his last discharge 5 days prior, patient has remained weak.  He reports compliance to his medications including Lasix.  Few days ago, he started getting myoclonic jerks which is getting more frequent.  In the ED, patient was afebrile, blood pressure 167/105, breathing on room air Labs with sodium 119, creatinine normal, BNP elevated to 1200, serum osmolality low at 269, WC count 3.6. Respiratory virus panel including Covid PCR and influenza PCR negative. Urinalysis with clear yellow urine, negative for ketones, leukocytes Urine osmolality 378 Urine drug screen was positive for cocaine CT abdomen and pelvis showed liver cirrhosis, progressive anasarca and Moderate right and small left pleural effusion worse compared to the scan from 10 days  prior.  Patient admitted to hospitalist service for further evaluation management  Subjective: Patient was seen and examined this morning. Sleeping, not on supplemental oxygen.  Has multiple frequent whole body myoclonic jerks Does not have alcohol withdrawal tremors. Chart reviewed Hemodynamically stable. Labs this morning with sodium slightly better at 121, lactic acid normal at 1.2  Assessment/Plan: Volume overload status Acute on chronic combined systolic and diastolic CHF Essential hypertension Portal hypertension/anasarca -Patient presented with volume overload status in the setting of low EF EF 30-35%, anasarca from liver cirrhosis.  -Has 1+ bilateral peripheral edema, elevated BNP, moderate bilateral pleural effusion and progressive anasarca. -Received IV Lasix 20 mg daily. Currently on diuresis with IV Lasix 40 mg twice daily  -Continue to monitor, intake, output, daily weight, blood pressure, renal function and electrolyte panel.  -Other heart failure medicines at home include Farxiga 5 mg daily and BiDil 20/37.5 mg 3 times daily -Resume both.  Acute on chronic hypervolemic hyponatremia -Presented with a sodium of 119, up to 121 this morning -Serum osmolality low at 269 and urine osmolality elevated to 378. -On last discharge, patient was started on sodium chloride 2 g 3 times daily tablets. Questionable compliance. -Expect improvement with IV Lasix, fluid restriction and continuation of salt tablets -Continue to monitor BMP every 4 hours for now. Recent Labs  Lab 10/11/20 1129 10/11/20 1350 10/11/20 2039 10/12/20 0456 10/17/20 2024 10/18/20 0606 10/18/20 1028  NA 121* 123* 126* 128* 119* 121* 121*   Frequent myoclonic jerks -May be related to hyponatremia.  No evidence of sepsis. -Continue to monitor.  CKD3a -Creatinine is currently better than his baseline. -Expect some change with diuresis. Continue to monitor. Recent Labs  10/09/20 0912 10/09/20 1315  10/09/20 1622 10/10/20 0046 10/10/20 0441 10/11/20 1350 10/12/20 0456 10/17/20 2024 10/18/20 0606 10/18/20 1028  BUN 29* 30* 32* 28* 29* 21* 22* 9 9 10   CREATININE 1.46* 1.54* 1.53* 1.56* 1.47* 1.36* 1.29* 1.05 1.09 1.24   Acute abdominal pain, acute diarrhea -Complains of abdomen pain and diarrhea.   -I do not see lactulose in his list of home medicines.   -Continue to monitor diarrheal episodes.  IV Rocephin for SBP prophylaxis.   Alcoholic liver cirrhosis Chronic alcohol abuse At risk of alcohol withdrawal -Continue to drink 2 to 3 cans of beer a day -Counseled to quit alcohol. -CIWA protocol with as needed Ativan. -Continue Folate, thiamine, and multivitamin.  Chest pain -Chest pain resolved prior to presentation.  -He has significant risk factors for coronary disease, slight elevated troponin which is trending down. EKG without ischemic changes.  -Also has history of GERD and cocaine use, UDS positive for cocaine.  -At this time, I would consider a chest pain as noncardiac. -Continue to monitor. -Continue statin  Permanent A. fib:  -Currently rate controlled.  Not on anticoagulation due to fall risk and alcohol abuse. -Continue amiodarone 200 mg daily.  Noninsulin-dependent type 2 diabetes -A1c 6.4 on 10/11. -He is on Farxiga 5 mg daily at home primarily for heart failure. -Continue sliding scale insulin with Accu-Cheks. -Continue Neurontin for neuropathy Recent Labs  Lab 10/11/20 1715 10/11/20 2147 10/12/20 0730 10/12/20 1216 10/18/20 0900  GLUCAP 164* 118* 123* 120* 115*   Mobility: PT eval ordered. Code Status:   Code Status: Full Code  Nutritional status: There is no height or weight on file to calculate BMI.     Diet Order            Diet heart healthy/carb modified Room service appropriate? Yes; Fluid consistency: Thin; Fluid restriction: 1200 mL Fluid  Diet effective now                 DVT prophylaxis: enoxaparin (LOVENOX) injection 40  mg Start: 10/18/20 1400   Antimicrobials:  IV Rocephin Fluid: None Consultants: None Family Communication:  None at bedside  Status is: Inpatient  Remains inpatient appropriate because:Hemodynamically unstable, Persistent severe electrolyte disturbances, Ongoing diagnostic testing needed not appropriate for outpatient work up and IV treatments appropriate due to intensity of illness or inability to take PO   Dispo: The patient is from: Home              Anticipated d/c is to: Home              Anticipated d/c date is: > 3 days              Patient currently is not medically stable to d/c.       Infusions:  . azithromycin    . cefTRIAXone (ROCEPHIN)  IV      Scheduled Meds: . amiodarone  200 mg Oral Daily  . dapagliflozin propanediol  5 mg Oral QAC breakfast  . DULoxetine  60 mg Oral Daily  . enoxaparin (LOVENOX) injection  40 mg Subcutaneous Q24H  . folic acid  1 mg Oral Daily  . furosemide  40 mg Intravenous BID  . gabapentin  600 mg Oral BID  . insulin aspart  0-5 Units Subcutaneous QHS  . insulin aspart  0-9 Units Subcutaneous TID WC  . isosorbide-hydrALAZINE  1 tablet Oral TID  . multivitamin with minerals  1 tablet Oral Daily  . potassium chloride SA  20 mEq Oral Daily  . sodium chloride  2 g Oral TID WC  . thiamine  100 mg Oral Daily   Or  . thiamine  100 mg Intravenous Daily    Antimicrobials: Anti-infectives (From admission, onward)   Start     Dose/Rate Route Frequency Ordered Stop   10/18/20 1100  azithromycin (ZITHROMAX) 500 mg in sodium chloride 0.9 % 250 mL IVPB        500 mg 250 mL/hr over 60 Minutes Intravenous  Once 10/18/20 1058     10/18/20 1100  cefTRIAXone (ROCEPHIN) 1 g in sodium chloride 0.9 % 100 mL IVPB        1 g 200 mL/hr over 30 Minutes Intravenous  Once 10/18/20 1058     10/17/20 2215  cefTRIAXone (ROCEPHIN) 1 g in sodium chloride 0.9 % 100 mL IVPB        1 g 200 mL/hr over 30 Minutes Intravenous  Once 10/17/20 2209 10/17/20 2307    10/17/20 2215  azithromycin (ZITHROMAX) 500 mg in sodium chloride 0.9 % 250 mL IVPB        500 mg 250 mL/hr over 60 Minutes Intravenous  Once 10/17/20 2209 10/18/20 0128      PRN meds: acetaminophen **OR** acetaminophen, LORazepam **OR** LORazepam, morphine injection   Objective: Vitals:   10/18/20 0900 10/18/20 0920  BP: (!) 153/100 133/71  Pulse: 72 71  Resp: 19 (!) 24  Temp:  98.6 F (37 C)  SpO2: 97% 94%    Intake/Output Summary (Last 24 hours) at 10/18/2020 1100 Last data filed at 10/18/2020 0128 Gross per 24 hour  Intake 350 ml  Output --  Net 350 ml   There were no vitals filed for this visit. Weight change:  There is no height or weight on file to calculate BMI.   Physical Exam: General exam: Appears calm and comfortable.  Has intermittent myoclonic jerks. Skin: No rashes, lesions or ulcers. HEENT: Atraumatic, normocephalic, supple neck, no obvious bleeding Lungs: Clear to auscultation bilaterally CVS: Regular rate and rhythm, no murmur GI/Abd soft, mild diffuse tenderness, bowel sound present CNS: Sleeping, opens eyes on verbal command.  Able to answer questions.  Oriented Psychiatry: Depressed look Extremities: Warm, 1+ bilateral pedal edema  Data Review: I have personally reviewed the laboratory data and studies available.  Recent Labs  Lab 10/12/20 0456 10/17/20 2024  WBC 4.0 3.6*  NEUTROABS 2.7 2.6  HGB 10.6* 13.5  HCT 31.4* 41.9  MCV 87.2 87.5  PLT 169 324   Recent Labs  Lab 10/11/20 1350 10/11/20 1350 10/11/20 2039 10/12/20 0456 10/17/20 2024 10/18/20 0606 10/18/20 1028  NA 123*   < > 126* 128* 119* 121* 121*  K 4.4  --   --  4.5 4.4 4.2 4.3  CL 95*  --   --  101 89* 91* 89*  CO2 21*  --   --  21* 14* 20* 20*  GLUCOSE 169*  --   --  129* 129* 96 107*  BUN 21*  --   --  22* 9 9 10   CREATININE 1.36*  --   --  1.29* 1.05 1.09 1.24  CALCIUM 7.7*  --   --  8.0* 8.1* 7.8* 7.8*  MG  --   --   --  2.1  --   --   --    < > = values in  this interval not displayed.    F/u labs ordered.  Signed, Terrilee Croak, MD [facility] 10/18/2020

## 2020-10-18 NOTE — ED Notes (Signed)
Lunch Tray Ordered @ 1042. 
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
## 2020-10-19 LAB — AMMONIA: Ammonia: 29 umol/L (ref 9–35)

## 2020-10-19 LAB — CBC WITH DIFFERENTIAL/PLATELET
Abs Immature Granulocytes: 0 10*3/uL (ref 0.00–0.07)
Basophils Absolute: 0 10*3/uL (ref 0.0–0.1)
Basophils Relative: 1 %
Eosinophils Absolute: 0 10*3/uL (ref 0.0–0.5)
Eosinophils Relative: 2 %
HCT: 28.9 % — ABNORMAL LOW (ref 39.0–52.0)
Hemoglobin: 9.7 g/dL — ABNORMAL LOW (ref 13.0–17.0)
Immature Granulocytes: 0 %
Lymphocytes Relative: 9 %
Lymphs Abs: 0.2 10*3/uL — ABNORMAL LOW (ref 0.7–4.0)
MCH: 28.3 pg (ref 26.0–34.0)
MCHC: 33.6 g/dL (ref 30.0–36.0)
MCV: 84.3 fL (ref 80.0–100.0)
Monocytes Absolute: 0.7 10*3/uL (ref 0.1–1.0)
Monocytes Relative: 29 %
Neutro Abs: 1.5 10*3/uL — ABNORMAL LOW (ref 1.7–7.7)
Neutrophils Relative %: 59 %
Platelets: 213 10*3/uL (ref 150–400)
RBC: 3.43 MIL/uL — ABNORMAL LOW (ref 4.22–5.81)
RDW: 15.5 % (ref 11.5–15.5)
WBC: 2.5 10*3/uL — ABNORMAL LOW (ref 4.0–10.5)
nRBC: 0 % (ref 0.0–0.2)

## 2020-10-19 LAB — BASIC METABOLIC PANEL
Anion gap: 10 (ref 5–15)
BUN: 12 mg/dL (ref 6–20)
CO2: 23 mmol/L (ref 22–32)
Calcium: 7.6 mg/dL — ABNORMAL LOW (ref 8.9–10.3)
Chloride: 94 mmol/L — ABNORMAL LOW (ref 98–111)
Creatinine, Ser: 1.37 mg/dL — ABNORMAL HIGH (ref 0.61–1.24)
GFR, Estimated: 59 mL/min — ABNORMAL LOW (ref >60)
Glucose, Bld: 116 mg/dL — ABNORMAL HIGH (ref 70–99)
Potassium: 3.4 mmol/L — ABNORMAL LOW (ref 3.5–5.1)
Sodium: 127 mmol/L — ABNORMAL LOW (ref 135–145)

## 2020-10-19 LAB — MAGNESIUM: Magnesium: 1.5 mg/dL — ABNORMAL LOW (ref 1.7–2.4)

## 2020-10-19 LAB — PHOSPHORUS: Phosphorus: 3 mg/dL (ref 2.5–4.6)

## 2020-10-19 LAB — GLUCOSE, CAPILLARY
Glucose-Capillary: 123 mg/dL — ABNORMAL HIGH (ref 70–99)
Glucose-Capillary: 141 mg/dL — ABNORMAL HIGH (ref 70–99)
Glucose-Capillary: 128 mg/dL — ABNORMAL HIGH (ref 70–99)
Glucose-Capillary: 218 mg/dL — ABNORMAL HIGH (ref 70–99)

## 2020-10-19 MED ORDER — MAGNESIUM SULFATE 2 GM/50ML IV SOLN
2.0000 g | Freq: Once | INTRAVENOUS | Status: AC
  Administered 2020-10-19: 2 g via INTRAVENOUS
  Filled 2020-10-19: qty 50

## 2020-10-19 MED ORDER — AZITHROMYCIN 500 MG PO TABS
500.0000 mg | ORAL_TABLET | Freq: Every day | ORAL | Status: AC
  Administered 2020-10-19 – 2020-10-21 (×3): 500 mg via ORAL
  Filled 2020-10-19 (×3): qty 1

## 2020-10-19 MED ORDER — DAPAGLIFLOZIN PROPANEDIOL 10 MG PO TABS
10.0000 mg | ORAL_TABLET | Freq: Every day | ORAL | Status: DC
  Administered 2020-10-20 – 2020-10-22 (×3): 10 mg via ORAL
  Filled 2020-10-19 (×3): qty 1

## 2020-10-19 NOTE — Progress Notes (Signed)
PROGRESS NOTE  Brad [name]  DOB: [birth year]  PCP: Charlott Rakes, MD XVQ:[id number]  DOA: 10/17/2020  LOS: 2 days   Chief Complaint  Patient presents with  . Shortness of Breath  . Cough  . Congestive Heart Failure   Brief narrative: Brad [name] is a 59 y.o. male who presented to the ED on 10/17/2020 with multiple complaints including progressive weakness, frequent muscle jerks, worsening swelling and diarrhea since his last discharge 5 days prior.  He also complained of frequent myoclonic jerks. PMH significant for T2DM, HTN, HLD, permanent A. Fib, chronic combined systolic and diastolic CHF, CKD stage III, polysubstance abuse, hep C, alcoholic liver cirrhosis, depression, GERD. He was recently hospitalized 10/18-10/22 for CHF exacerbation and hyponatremia. He required IV Lasix, hypertonic saline and tolvaptan at that time patient. He has had multiple prior admissions for recurrent hyponatremia thought to be due to alcohol abuse, liver cirrhosis and portal hypertension.  Patient continues to drink 2 cans of beer daily and continues to use cocaine. UDS positive for cocaine. Patient reports that  In the ED, initial work-up showed sodium level low at 119, serum osmolality low at 269, Urine osmolality 378 CT abdomen and pelvis showed liver cirrhosis, progressive anasarca and Moderate right and small left pleural effusion worse compared to the scan from 10 days prior.  Patient admitted to hospitalist service for further evaluation management.  Subjective: Patient was seen and examined this morning. Feels weak.  Lying down in bed.  Not in distress.  Myoclonic jerks improving. Not on supplemental oxygen. No withdrawal symptoms. Blood work this morning with improving sodium level but worsening creatinine.  Assessment/Plan: Volume overload status Acute on chronic combined systolic and diastolic CHF Essential hypertension Portal hypertension/anasarca -Patient presented with  volume overload status in the setting of low EF EF 30-35%, anasarca from liver cirrhosis.  -On admission, he had 1+ bilateral peripheral edema, elevated BNP, moderate bilateral pleural effusion and progressive anasarca. -Currently on IV Lasix 40 mg twice daily.  Seems adequately dehydrated, creatinine elevated this morning.  I stopped IV Lasix morning. -Continue to monitor, intake, output, daily weight, blood pressure, renal function and electrolyte panel.  -Continue Farxiga 5 mg daily and BiDil 20/37.5 mg 3 times daily  Acute on chronic hypervolemic hyponatremia -Presented with a sodium of 119, serum osmolality low at 269 and urine osmolality elevated to 378. -With IV Lasix and salt tab, sodium level is improving, 127 today.  -Continue to monitor. Recent Labs  Lab 10/17/20 2024 10/18/20 0606 10/18/20 1028 10/19/20 0341  NA 119* 121* 121* 127*   Frequent myoclonic jerks -May be related to hyponatremia.  No evidence of sepsis. -Continue to monitor.  CKD3a -Creatinine is up a last 24 hours with IV Lasix but still remains at baseline. -Continue to monitor. Recent Labs    10/09/20 1315 10/09/20 1622 10/10/20 0046 10/10/20 0441 10/11/20 1350 10/12/20 0456 10/17/20 2024 10/18/20 0606 10/18/20 1028 10/19/20 0341  BUN 30* 32* 28* 29* 21* 22* 9 9 10 12   CREATININE 1.54* 1.53* 1.56* 1.47* 1.36* 1.29* 1.05 1.09 1.24 1.37*   Acute abdominal pain, acute diarrhea -On admission, patient complained of abdomen pain and diarrhea.   -Currently on IV Rocephin for SBP prophylaxis. -Reports improvement in diarrhea.  Continue to monitor.  Once diarrhea improves, patient may need to be initiated on lactulose to target 2-3 bowel movements a day.  Alcoholic liver cirrhosis Chronic alcohol abuse At risk of alcohol withdrawal -Continue to drink 2 to 3 cans of beer a day -  Counseled to quit alcohol. -CIWA protocol with as needed Ativan.  -Continue Folate, thiamine, and multivitamin.  Chest  pain -Chest pain resolved prior to presentation.  -He has significant risk factors for coronary disease, slight elevated troponin which is trending down. EKG without ischemic changes.  -Also has history of GERD and cocaine use, UDS positive for cocaine.  -At this time, I would consider a chest pain as noncardiac. -Continue to monitor. -Continue statin  Permanent A. fib:  -Currently rate controlled.  Not on anticoagulation due to fall risk and alcohol abuse. -Continue amiodarone 200 mg daily.  Noninsulin-dependent type 2 diabetes -A1c 6.4 on 10/11. -He is on Farxiga 5 mg daily at home primarily for heart failure. -Continue sliding scale insulin with Accu-Cheks. -Continue Neurontin for neuropathy Recent Labs  Lab 10/18/20 1230 10/18/20 1605 10/18/20 2303 10/19/20 0624 10/19/20 1133  GLUCAP 110* 116* 140* 123* 141*   Mobility: PT eval obtained.  Outpatient PT recommended.. Code Status:   Code Status: Full Code  Nutritional status: Body mass index is 24.38 kg/m.     Diet Order            Diet heart healthy/carb modified Room service appropriate? Yes; Fluid consistency: Thin; Fluid restriction: 1200 mL Fluid  Diet effective now                 DVT prophylaxis: enoxaparin (LOVENOX) injection 40 mg Start: 10/18/20 1400   Antimicrobials:  IV Rocephin Fluid: None Consultants: None Family Communication:  None at bedside  Status is: Inpatient  Remains inpatient appropriate because -continues to have low sodium level, diarrhea, abdominal pain.  Dispo: The patient is from: Home              Anticipated d/c is to: Home              Anticipated d/c date is: 1 to 2 days              Patient currently is not medically stable to d/c.   Infusions:  . cefTRIAXone (ROCEPHIN)  IV 2 g (10/19/20 0203)    Scheduled Meds: . amiodarone  200 mg Oral Daily  . azithromycin  500 mg Oral Daily  . dapagliflozin propanediol  5 mg Oral QAC breakfast  . DULoxetine  60 mg Oral Daily    . enoxaparin (LOVENOX) injection  40 mg Subcutaneous Q24H  . folic acid  1 mg Oral Daily  . gabapentin  600 mg Oral BID  . insulin aspart  0-5 Units Subcutaneous QHS  . insulin aspart  0-9 Units Subcutaneous TID WC  . isosorbide-hydrALAZINE  1 tablet Oral TID  . multivitamin with minerals  1 tablet Oral Daily  . potassium chloride SA  20 mEq Oral Daily  . sodium chloride  2 g Oral TID WC  . thiamine  100 mg Oral Daily    Antimicrobials: Anti-infectives (From admission, onward)   Start     Dose/Rate Route Frequency Ordered Stop   10/19/20 1045  azithromycin (ZITHROMAX) tablet 500 mg        500 mg Oral Daily 10/19/20 0959 10/22/20 0959   10/18/20 2300  azithromycin (ZITHROMAX) 500 mg in sodium chloride 0.9 % 250 mL IVPB  Status:  Discontinued        500 mg 250 mL/hr over 60 Minutes Intravenous Every 24 hours 10/18/20 1058 10/19/20 0959   10/18/20 2300  cefTRIAXone (ROCEPHIN) 2 g in sodium chloride 0.9 % 100 mL IVPB  2 g 200 mL/hr over 30 Minutes Intravenous Every 24 hours 10/18/20 1058     10/17/20 2215  cefTRIAXone (ROCEPHIN) 1 g in sodium chloride 0.9 % 100 mL IVPB        1 g 200 mL/hr over 30 Minutes Intravenous  Once 10/17/20 2209 10/17/20 2307   10/17/20 2215  azithromycin (ZITHROMAX) 500 mg in sodium chloride 0.9 % 250 mL IVPB        500 mg 250 mL/hr over 60 Minutes Intravenous  Once 10/17/20 2209 10/18/20 0128      PRN meds: acetaminophen **OR** acetaminophen, LORazepam **OR** LORazepam, morphine injection   Objective: Vitals:   10/19/20 0402 10/19/20 0800  BP: 125/77 116/72  Pulse: 74 79  Resp: 18 18  Temp: 98.3 F (36.8 C) 97.6 F (36.4 C)  SpO2: 98% 98%    Intake/Output Summary (Last 24 hours) at 10/19/2020 1211 Last data filed at 10/19/2020 1043 Gross per 24 hour  Intake 1368 ml  Output 2550 ml  Net -1182 ml   Filed Weights   10/18/20 1518 10/19/20 0402  Weight: 80.1 kg 79.3 kg   Weight change:  Body mass index is 24.38 kg/m.   Physical  Exam: General exam: Appears calm and comfortable.  Improved myoclonic jerks Skin: No rashes, lesions or ulcers. HEENT: Atraumatic, normocephalic, supple neck, no obvious bleeding Lungs: Clear to auscultation bilaterally CVS: Regular rate and rhythm, no murmur GI/Abd soft, continues to have mild diffuse tenderness, bowel sound present CNS: Sleeping, opens eyes on verbal command.  Able to answer questions.  Oriented Psychiatry: Depressed look Extremities: Warm, improved bilateral pedal edema.  Data Review: I have personally reviewed the laboratory data and studies available.  Recent Labs  Lab 10/17/20 2024 10/19/20 0341  WBC 3.6* 2.5*  NEUTROABS 2.6 1.5*  HGB 13.5 9.7*  HCT 41.9 28.9*  MCV 87.5 84.3  PLT 324 213   Recent Labs  Lab 10/17/20 2024 10/18/20 0606 10/18/20 1028 10/19/20 0341  NA 119* 121* 121* 127*  K 4.4 4.2 4.3 3.4*  CL 89* 91* 89* 94*  CO2 14* 20* 20* 23  GLUCOSE 129* 96 107* 116*  BUN 9 9 10 12   CREATININE 1.05 1.09 1.24 1.37*  CALCIUM 8.1* 7.8* 7.8* 7.6*  MG  --   --   --  1.5*  PHOS  --   --   --  3.0    F/u labs ordered.  Signed, Terrilee Croak, MD [facility] 10/19/2020

## 2020-10-19 NOTE — Progress Notes (Signed)
Outpatient Heart and Vascular CSW team (following through community paramedicine program) met with pt at bedside. Discussed pt multiple admissions and medical care team's recommendations for substance use support/cessation. Pt is interested in substance use treatment options. We discussed assisting pt with getting on wait list for [facility] services. Pt agreeable, gives permission for CSW team to make that call. Pt aware most inpatient cessation programs are currently operating on waitlists. He is okay with outpatient programming as well. We discussed importance of cutting back/quitting his ETOH use and eating more balanced meals in order to try and prevent recurrent readmissions/falls.   We also discussed pt getting back in touch with [facility] [employer] to see if he was still on wait list for housing assistance.   Isabel Chasse, MSW, LCSW [place] [facility]   

## 2020-10-19 NOTE — Progress Notes (Signed)
Heart Failure Stewardship Pharmacist Progress Note   PCP: Charlott Rakes, MD PCP-Cardiologist: [name], MD    HPI:  59 yo M with PMH of T2DM, HTN, HLD, afib, CHF, CKD III, polysubstance abuse, hep C, alcoholic liver cirrhosis, depression, and GERD. He presented to the ED on 10/17/20 with shortness of breath, cough, and worsening swelling. His last ECHO was done 07/2020 and LVEF was 30-35%.  Current HF Medications: BiDil 20/37.5 mg TID Farxiga 5 mg daily  Prior to admission HF Medications: Furosemide 40 mg BID BiDil 20/37.5 mg TID Farxiga 5 mg daily  Pertinent Lab Values: . Serum creatinine 1.37, BUN 12, Potassium 3.4, Sodium 127, BNP 1190, Magnesium 1.5   Vital Signs: . Weight: 174 lbs (admission weight: 176 lbs) . Blood pressure: 110/70s  . Heart rate: 70s  Medication Assistance / Insurance Benefits Check: Does the patient have prescription insurance?  Yes Type of insurance plan: [place] [employer]  Outpatient Pharmacy:  Prior to admission outpatient pharmacy: [place] Is the patient willing to use [facility] pharmacy at discharge? Yes Is the patient willing to transition their outpatient pharmacy to utilize a [facility] outpatient pharmacy?   Pending    Assessment: 1. Acute on chronic systolic CHF (EF 11-57%), probably due to substance abuse (ETOH, cocaine use). NYHA class II/III symptoms. - Now off IV furosemide. Potassium and magnesium replaced. - Was on carvedilol in the past but this was stopped due to bradycardia. Consider another trial of this as this would be the preferred beta blocker for HF in the setting of ongoing cocaine use. - Continue BiDil 20/37.5 mg TID - Consider increasing Farxiga to target dose of 10 mg daily - Was on spironolactone but this was stopped in September 2021 due to hyperkalemia. K is low at 3.4 today. He may be able to tolerate this again. Consider starting at 12.5 mg daily for slower titration.   Plan: 1) Medication changes  recommended at this time: - Increase Farxiga to 10 mg daily  2)  Education  - To be completed prior to discharge  Kerby Nora, PharmD, BCPS Heart Failure Stewardship Pharmacist Phone [phone]

## 2020-10-19 NOTE — Progress Notes (Signed)
PT Cancellation Note  Patient Details Name: Brad [name] MRN: [id number] DOB: 12/16/1961   Cancelled Treatment:    Reason Eval/Treat Not Completed: Other (comment).  Pt was seen for attempted therapy but does not feel well enough to get OOB.  Asking for PT to see on a Sat, told pt it would entirely depend on staff availability.  Follow up as time and pt allow.   Ramond Dial 10/19/2020, 3:42 PM   Mee Hives, PT MS Acute Rehab Dept. Number: [place] and [place]

## 2020-10-19 NOTE — Progress Notes (Signed)
Right infiltrated peripheral iv removed. Iv team consulted for new iv placement.  Pt denies pain.

## 2020-10-20 DIAGNOSIS — E871 Hypo-osmolality and hyponatremia: Secondary | ICD-10-CM

## 2020-10-20 DIAGNOSIS — I482 Chronic atrial fibrillation, unspecified: Secondary | ICD-10-CM

## 2020-10-20 LAB — CBC WITH DIFFERENTIAL/PLATELET
Abs Immature Granulocytes: 0.01 10*3/uL (ref 0.00–0.07)
Basophils Absolute: 0 10*3/uL (ref 0.0–0.1)
Basophils Relative: 1 %
Eosinophils Absolute: 0.1 10*3/uL (ref 0.0–0.5)
Eosinophils Relative: 4 %
HCT: 29.8 % — ABNORMAL LOW (ref 39.0–52.0)
Hemoglobin: 9.9 g/dL — ABNORMAL LOW (ref 13.0–17.0)
Immature Granulocytes: 0 %
Lymphocytes Relative: 10 %
Lymphs Abs: 0.3 10*3/uL — ABNORMAL LOW (ref 0.7–4.0)
MCH: 28.4 pg (ref 26.0–34.0)
MCHC: 33.2 g/dL (ref 30.0–36.0)
MCV: 85.4 fL (ref 80.0–100.0)
Monocytes Absolute: 0.8 10*3/uL (ref 0.1–1.0)
Monocytes Relative: 23 %
Neutro Abs: 2 10*3/uL (ref 1.7–7.7)
Neutrophils Relative %: 62 %
Platelets: 219 10*3/uL (ref 150–400)
RBC: 3.49 MIL/uL — ABNORMAL LOW (ref 4.22–5.81)
RDW: 15.9 % — ABNORMAL HIGH (ref 11.5–15.5)
WBC: 3.3 10*3/uL — ABNORMAL LOW (ref 4.0–10.5)
nRBC: 0 % (ref 0.0–0.2)

## 2020-10-20 LAB — BASIC METABOLIC PANEL
Anion gap: 7 (ref 5–15)
BUN: 13 mg/dL (ref 6–20)
CO2: 25 mmol/L (ref 22–32)
Calcium: 7.9 mg/dL — ABNORMAL LOW (ref 8.9–10.3)
Chloride: 97 mmol/L — ABNORMAL LOW (ref 98–111)
Creatinine, Ser: 1.37 mg/dL — ABNORMAL HIGH (ref 0.61–1.24)
GFR, Estimated: 59 mL/min — ABNORMAL LOW (ref >60)
Glucose, Bld: 123 mg/dL — ABNORMAL HIGH (ref 70–99)
Potassium: 3.9 mmol/L (ref 3.5–5.1)
Sodium: 129 mmol/L — ABNORMAL LOW (ref 135–145)

## 2020-10-20 LAB — GLUCOSE, CAPILLARY
Glucose-Capillary: 114 mg/dL — ABNORMAL HIGH (ref 70–99)
Glucose-Capillary: 125 mg/dL — ABNORMAL HIGH (ref 70–99)
Glucose-Capillary: 154 mg/dL — ABNORMAL HIGH (ref 70–99)
Glucose-Capillary: 114 mg/dL — ABNORMAL HIGH (ref 70–99)

## 2020-10-20 LAB — MAGNESIUM: Magnesium: 2 mg/dL (ref 1.7–2.4)

## 2020-10-20 MED ORDER — LOPERAMIDE HCL 2 MG PO CAPS
2.0000 mg | ORAL_CAPSULE | Freq: Two times a day (BID) | ORAL | Status: DC
  Administered 2020-10-20 – 2020-10-21 (×3): 2 mg via ORAL
  Filled 2020-10-20 (×3): qty 1

## 2020-10-20 MED ORDER — OXYCODONE-ACETAMINOPHEN 5-325 MG PO TABS
1.0000 | ORAL_TABLET | ORAL | Status: DC | PRN
  Administered 2020-10-20: 1 via ORAL
  Filled 2020-10-20: qty 1

## 2020-10-20 MED ORDER — OXYCODONE HCL 5 MG PO TABS
5.0000 mg | ORAL_TABLET | ORAL | Status: DC | PRN
  Administered 2020-10-20 – 2020-10-22 (×9): 5 mg via ORAL
  Filled 2020-10-20 (×9): qty 1

## 2020-10-20 NOTE — Progress Notes (Signed)
Pt has been calm and cooperative throughout shift. Required ativan x 1 for a CIWA score of 8 which was very effective however, no other high CIWA scores have been noted. Pt started on immodium as ordered for diarrhea. Stool sample is needed however, pt has not had any episodes since stool was ordered. He is aware of the need for the sample. Hat placed in toilet. Pt has been compliant with his fluid restriction is verbalizes understanding and awareness. He worked with PT today and ambulated the hallway with use of a cane. States he used a cane at home but stopped. Taking all meds as ordered. Using PRN pain meds as well for abd pain. Will continue to monitor this patient.

## 2020-10-20 NOTE — Progress Notes (Signed)
PROGRESS NOTE    Brad [name]  STM:[id number] DOB: July 07, 1961 DOA: 10/17/2020 PCP: Charlott Rakes, MD   Chief complaint. Shortness of breath. Brief Narrative:  Brad [name] is a 59 y.o. male who presented to the ED on 10/17/2020 with multiple complaints including progressive weakness, frequent muscle jerks, worsening swelling and diarrhea since his last discharge 5 days prior.  He also complained of frequent myoclonic jerks. PMH significant for T2DM, HTN, HLD, permanent A. Fib, chronic combined systolic and diastolic CHF, CKD stage III, polysubstance abuse, hep C, alcoholic liver cirrhosis, depression, GERD. He was recently hospitalized 10/18-10/22 for CHF exacerbation and hyponatremia. He required IV Lasix, hypertonic saline and tolvaptan at that time patient. He has had multiple prior admissions for recurrent hyponatremia thought to be due to alcohol abuse, liver cirrhosis and portal hypertension.  Patient continues to drink 2 cans of beer daily and continues to use cocaine. UDS positive for cocaine. Patient reports that  In the ED, initial work-up showed sodium level low at 119, serum osmolality low at 269, Urine osmolality 378 CT abdomen and pelvis showed liver cirrhosis, progressive anasarca and Moderate right and small left pleural effusion worse compared to the scan from 10 days prior.  Patient admitted to hospitalist service for further evaluation management.     Assessment & Plan:   Principal Problem:   CHF exacerbation ([place]) Active Problems:   CKD (chronic kidney disease) stage 3, GFR 30-59 ml/min (HCC)   Chronic atrial fibrillation   Hyponatremia   Chest pain  #1. Acute on chronic combined systolic and diastolic congestive heart failure Portal hypertension and anasarca, alcoholic liver cirrhosis. Patient has ejection fraction 30 to 35%. Patient was initially given IV Lasix. Lasix discontinued due to worsening renal function yesterday. Patient appears to be  euvolemic at this time. No evidence of volume overload. We will continue to hold Lasix.  #2. Acute on chronic hypovolemic hyponatremia. Condition partially due to diarrhea. Patient still has loose stools with multiple times a day. Some abdominal cramping. Patient does not have leukocytosis. C. difficile toxin ordered on 28th, message to nurse to collect stool. However, C. difficile probability is low. I will add Imodium.   #3 alcoholic liver cirrhosis, chronic alcohol abuse. Patient does not have any evidence of withdrawal. Continue CIWA protocol.  4. Chest pain. Patient has history of cocaine abuse, no additional work-up is needed.  5. Permanent atrial fibrillation. Heart rate under control. Patient was not placed on anticoagulation due to fall risk and alcohol abuse chronically. Continue amiodarone.  #6. Type 2 diabetes with diabetic neuropathy. Glucose seem to be controlled. Continue sliding scale insulin.  #7. leukopenia. Secondary to alcohol liver cirrhosis.     DVT prophylaxis: Lovenox Code Status: Full Family Communication: None Disposition Plan:  .   Status is: Inpatient  Remains inpatient appropriate because:Inpatient level of care appropriate due to severity of illness   Dispo: The patient is from: Home              Anticipated d/c is to: Home              Anticipated d/c date is: 1 day              Patient currently is not medically stable to d/c.        I/O last 3 completed shifts: In: 1929 [P.O.:1479; IV Piggyback:450] Out: 8921 [Urine:1550] No intake/output data recorded.     Consultants:   None  Procedures: None  Antimicrobials:None Subjective: Patient complaining of abdominal  cramping with multiple loose stools a day. Good appetite without nausea vomiting. He is on fluid restriction. Denies any short of breath or cough. No fever or chills. No dysuria hematuria.  Objective: Vitals:   10/19/20 1500 10/19/20 2007 10/20/20 0431  10/20/20 0837  BP: 108/69 117/72 107/64 131/76  Pulse: 74 75 70 64  Resp: 18 20 19 20   Temp:  97.8 F (36.6 C) 98.2 F (36.8 C)   TempSrc:  Oral Oral   SpO2: 97% 100% 99% 99%  Weight:   79.7 kg     Intake/Output Summary (Last 24 hours) at 10/20/2020 0935 Last data filed at 10/20/2020 0112 Gross per 24 hour  Intake 801 ml  Output 600 ml  Net 201 ml   Filed Weights   10/18/20 1518 10/19/20 0402 10/20/20 0431  Weight: 80.1 kg 79.3 kg 79.7 kg    Examination:  General exam: Appears calm and comfortable  Respiratory system: Decreased breathing sounds without crackles or wheezes. Respiratory effort normal. Cardiovascular system: Irregular, no JVD, murmurs, rubs, gallops or clicks. No pedal edema. Gastrointestinal system: Abdomen is nondistended, soft and nontender. No organomegaly or masses felt. Normal bowel sounds heard. Central nervous system: Alert and oriented. No focal neurological deficits. Extremities: Symmetric  Skin: No rashes, lesions or ulcers Psychiatry:  Mood & affect appropriate.     Data Reviewed: I have personally reviewed following labs and imaging studies  CBC: Recent Labs  Lab 10/17/20 2024 10/19/20 0341  WBC 3.6* 2.5*  NEUTROABS 2.6 1.5*  HGB 13.5 9.7*  HCT 41.9 28.9*  MCV 87.5 84.3  PLT 324 267   Basic Metabolic Panel: Recent Labs  Lab 10/17/20 2024 10/18/20 0606 10/18/20 1028 10/19/20 0341  NA 119* 121* 121* 127*  K 4.4 4.2 4.3 3.4*  CL 89* 91* 89* 94*  CO2 14* 20* 20* 23  GLUCOSE 129* 96 107* 116*  BUN 9 9 10 12   CREATININE 1.05 1.09 1.24 1.37*  CALCIUM 8.1* 7.8* 7.8* 7.6*  MG  --   --   --  1.5*  PHOS  --   --   --  3.0   GFR: Estimated Creatinine Clearance: 61.1 mL/min (A) (by C-G formula based on SCr of 1.37 mg/dL (H)). Liver Function Tests: Recent Labs  Lab 10/17/20 2024  AST 36  ALT 31  ALKPHOS 55  BILITOT 0.7  PROT 6.3*  ALBUMIN 2.7*   Recent Labs  Lab 10/17/20 2024  LIPASE 27   Recent Labs  Lab  10/19/20 0341  AMMONIA 29   Coagulation Profile: No results for input(s): INR, PROTIME in the last 168 hours. Cardiac Enzymes: No results for input(s): CKTOTAL, CKMB, CKMBINDEX, TROPONINI in the last 168 hours. BNP (last 3 results) No results for input(s): PROBNP in the last 8760 hours. HbA1C: Recent Labs    10/18/20 0606  HGBA1C 6.6*   CBG: Recent Labs  Lab 10/19/20 0624 10/19/20 1133 10/19/20 1709 10/19/20 2018 10/20/20 0835  GLUCAP 123* 141* 128* 218* 114*   Lipid Profile: No results for input(s): CHOL, HDL, LDLCALC, TRIG, CHOLHDL, LDLDIRECT in the last 72 hours. Thyroid Function Tests: No results for input(s): TSH, T4TOTAL, FREET4, T3FREE, THYROIDAB in the last 72 hours. Anemia Panel: No results for input(s): VITAMINB12, FOLATE, FERRITIN, TIBC, IRON, RETICCTPCT in the last 72 hours. Sepsis Labs: Recent Labs  Lab 10/18/20 0606  PROCALCITON <0.10  LATICACIDVEN 1.2    Recent Results (from the past 240 hour(s))  Resp Panel by RT PCR (RSV, Flu A&B, Covid) -  Nasopharyngeal Swab     Status: None   Collection Time: 10/17/20  8:26 PM   Specimen: Nasopharyngeal Swab  Result Value Ref Range Status   SARS Coronavirus 2 by RT PCR NEGATIVE NEGATIVE Final    Comment: (NOTE) SARS-CoV-2 target nucleic acids are NOT DETECTED.  The SARS-CoV-2 RNA is generally detectable in upper respiratoy specimens during the acute phase of infection. The lowest concentration of SARS-CoV-2 viral copies this assay can detect is 131 copies/mL. A negative result does not preclude SARS-Cov-2 infection and should not be used as the sole basis for treatment or other patient management decisions. A negative result may occur with  improper specimen collection/handling, submission of specimen other than nasopharyngeal swab, presence of viral mutation(s) within the areas targeted by this assay, and inadequate number of viral copies (<131 copies/mL). A negative result must be combined with  clinical observations, patient history, and epidemiological information. The expected result is Negative.  Fact Sheet for Patients:  PinkCheek.be  Fact Sheet for Healthcare Providers:  GravelBags.it  This test is no t yet approved or cleared by the [place] FDA and  has been authorized for detection and/or diagnosis of SARS-CoV-2 by FDA under an Emergency Use Authorization (EUA). This EUA will remain  in effect (meaning this test can be used) for the duration of the COVID-19 declaration under Section 564(b)(1) of the Act, 21 U.S.C. section 360bbb-3(b)(1), unless the authorization is terminated or revoked sooner.     Influenza A by PCR NEGATIVE NEGATIVE Final   Influenza B by PCR NEGATIVE NEGATIVE Final    Comment: (NOTE) The Xpert Xpress SARS-CoV-2/FLU/RSV assay is intended as an aid in  the diagnosis of influenza from Nasopharyngeal swab specimens and  should not be used as a sole basis for treatment. Nasal washings and  aspirates are unacceptable for Xpert Xpress SARS-CoV-2/FLU/RSV  testing.  Fact Sheet for Patients: PinkCheek.be  Fact Sheet for Healthcare Providers: GravelBags.it  This test is not yet approved or cleared by the [place] FDA and  has been authorized for detection and/or diagnosis of SARS-CoV-2 by  FDA under an Emergency Use Authorization (EUA). This EUA will remain  in effect (meaning this test can be used) for the duration of the  Covid-19 declaration under Section 564(b)(1) of the Act, 21  U.S.C. section 360bbb-3(b)(1), unless the authorization is  terminated or revoked.    Respiratory Syncytial Virus by PCR NEGATIVE NEGATIVE Final    Comment: (NOTE) Fact Sheet for Patients: PinkCheek.be  Fact Sheet for Healthcare Providers: GravelBags.it  This test is not yet approved  or cleared by the [place] FDA and  has been authorized for detection and/or diagnosis of SARS-CoV-2 by  FDA under an Emergency Use Authorization (EUA). This EUA will remain  in effect (meaning this test can be used) for the duration of the  COVID-19 declaration under Section 564(b)(1) of the Act, 21 U.S.C.  section 360bbb-3(b)(1), unless the authorization is terminated or  revoked. Performed at [facility], [place] [address] [place], [place] [address]          Radiology Studies: No results found.      Scheduled Meds: . amiodarone  200 mg Oral Daily  . azithromycin  500 mg Oral Daily  . dapagliflozin propanediol  10 mg Oral QAC breakfast  . DULoxetine  60 mg Oral Daily  . enoxaparin (LOVENOX) injection  40 mg Subcutaneous Q24H  . folic acid  1 mg Oral Daily  . gabapentin  600 mg  Oral BID  . insulin aspart  0-5 Units Subcutaneous QHS  . insulin aspart  0-9 Units Subcutaneous TID WC  . isosorbide-hydrALAZINE  1 tablet Oral TID  . loperamide  2 mg Oral BID AC  . multivitamin with minerals  1 tablet Oral Daily  . potassium chloride SA  20 mEq Oral Daily  . sodium chloride  2 g Oral TID WC  . thiamine  100 mg Oral Daily   Continuous Infusions: . cefTRIAXone (ROCEPHIN)  IV Stopped (10/20/20 0046)     LOS: 3 days    Time spent: 35 minutes    Sharen Hones, MD [facility]   To contact the attending provider between 7A-7P or the covering provider during after hours 7P-7A, please log into the web site [email or web address] and access using universal [place] password for that web site. If you do not have the password, please call the hospital operator.  10/20/2020, 9:35 AM

## 2020-10-20 NOTE — Progress Notes (Signed)
Physical Therapy Treatment Patient Details Name: Brad [name] MRN: [id number] DOB: [birth year] Today's Date: 10/20/2020    History of Present Illness 59 y.o. male who presented to the ED on 10/17/2020 with multiple complaints including progressive weakness, frequent muscle jerks, worsening swelling and diarrhea. PMH significant for T2DM, HTN, HLD, permanent A. Fib, chronic combined systolic and diastolic CHF, CKD stage III, polysubstance abuse, hep C, alcoholic liver cirrhosis, depression, GERD. PT with recurrent admissions for hyponatremia 2/2 alcohol abuse. Pt admitted for CHF exacerbation, hyponatremia, and myocolonal jerking.    PT Comments    Pt in bed on arrival and agreeable to participate with therapy. Straight cane brought in for session as pt uses one at base line. Even with cane pt required min A for gait with mild instability and drifting to the R. He reports increased dizziness with gait. Would benefit from continued balance training. At end of session pt became very fixated on pain meds and requesting they change the medication he was receiving. This PTA, stated many times that the RN would need to be involved as therapy does not deal in medication. After several attempts to be excused and communicate the pt's needs with the RN, the RN arrived and pt's medication needs were addressed. Will continue to follow acutely for mobility progression.    Follow Up Recommendations  Outpatient PT (balance)     Equipment Recommendations  None recommended by PT (pt owns necessary DME)    Recommendations for Other Services       Precautions / Restrictions Precautions Precautions: Fall    Mobility  Bed Mobility Overal bed mobility: Modified Independent             General bed mobility comments: bed flat with use of bed rails.   Transfers Overall transfer level: Needs assistance Equipment used: None Transfers: Sit to/from Stand Sit to Stand: Supervision         General  transfer comment: demonstrated safely  Ambulation/Gait Ambulation/Gait assistance: Min assist Gait Distance (Feet): 150 Feet Assistive device: Straight cane Gait Pattern/deviations: Step-through pattern;Staggering right Gait velocity: reduced Gait velocity interpretation: 1.31 - 2.62 ft/sec, indicative of limited community ambulator General Gait Details: Pt with slow mildly unsteady gait requiring min A for safety and stability. Pt drifting R and reporting dizziness. Cues for forward gaze for obstical negotiation. 1 stand rest break.    Stairs             Wheelchair Mobility    Modified [name] (Stroke Patients Only)       Balance Overall balance assessment: Needs assistance Sitting-balance support: Feet supported;No upper extremity supported Sitting balance-Leahy Scale: Good     Standing balance support: No upper extremity supported;During functional activity Standing balance-Leahy Scale: Poor Standing balance comment: minG without UE support                            Cognition Arousal/Alertness: Awake/alert Behavior During Therapy: WFL for tasks assessed/performed Overall Cognitive Status: Within Functional Limits for tasks assessed                                        Exercises      General Comments        Pertinent Vitals/Pain Pain Assessment: Faces Faces Pain Scale: Hurts a little bit Pain Location: stomach Pain Descriptors / Indicators: Aching;Discomfort Pain Intervention(s): Monitored during session;Limited  activity within patient's tolerance    Home Living                      Prior Function            PT Goals (current goals can now be found in the care plan section) Acute Rehab PT Goals Patient Stated Goal: To go home PT Goal Formulation: With patient Time For Goal Achievement: 11/01/20 Potential to Achieve Goals: Good Progress towards PT goals: Progressing toward goals    Frequency    Min  3X/week      PT Plan Current plan remains appropriate    Co-evaluation              AM-PAC PT "6 Clicks" Mobility   Outcome Measure  Help needed turning from your back to your side while in a flat bed without using bedrails?: None Help needed moving from lying on your back to sitting on the side of a flat bed without using bedrails?: None Help needed moving to and from a bed to a chair (including a wheelchair)?: None Help needed standing up from a chair using your arms (e.g., wheelchair or bedside chair)?: None Help needed to walk in hospital room?: A Little Help needed climbing 3-5 steps with a railing? : A Lot 6 Click Score: 21    End of Session Equipment Utilized During Treatment: Gait belt Activity Tolerance: Patient tolerated treatment well Patient left: in bed;with call bell/phone within reach;with bed alarm set;with nursing/sitter in room Nurse Communication: Mobility status;Other (comment) (pt requesting change to pain meds) PT Visit Diagnosis: Unsteadiness on feet (R26.81);Other abnormalities of gait and mobility (R26.89)     Time: 6734-1937 PT Time Calculation (min) (ACUTE ONLY): 28 min  Charges:  $Gait Training: 23-37 mins                     Benjiman Core, [place] Pager [phone] Acute Rehab   Allena Katz 10/20/2020, 12:40 PM

## 2020-10-21 ENCOUNTER — Other Ambulatory Visit (HOSPITAL_COMMUNITY): Payer: Self-pay | Admitting: Internal Medicine

## 2020-10-21 DIAGNOSIS — R079 Chest pain, unspecified: Secondary | ICD-10-CM

## 2020-10-21 LAB — CBC WITH DIFFERENTIAL/PLATELET
Abs Immature Granulocytes: 0.01 10*3/uL (ref 0.00–0.07)
Basophils Absolute: 0 10*3/uL (ref 0.0–0.1)
Basophils Relative: 1 %
Eosinophils Absolute: 0.2 10*3/uL (ref 0.0–0.5)
Eosinophils Relative: 4 %
HCT: 34.9 % — ABNORMAL LOW (ref 39.0–52.0)
Hemoglobin: 11.4 g/dL — ABNORMAL LOW (ref 13.0–17.0)
Immature Granulocytes: 0 %
Lymphocytes Relative: 12 %
Lymphs Abs: 0.5 10*3/uL — ABNORMAL LOW (ref 0.7–4.0)
MCH: 28.6 pg (ref 26.0–34.0)
MCHC: 32.7 g/dL (ref 30.0–36.0)
MCV: 87.7 fL (ref 80.0–100.0)
Monocytes Absolute: 0.7 10*3/uL (ref 0.1–1.0)
Monocytes Relative: 19 %
Neutro Abs: 2.5 10*3/uL (ref 1.7–7.7)
Neutrophils Relative %: 64 %
Platelets: 209 10*3/uL (ref 150–400)
RBC: 3.98 MIL/uL — ABNORMAL LOW (ref 4.22–5.81)
RDW: 16.2 % — ABNORMAL HIGH (ref 11.5–15.5)
WBC: 3.9 10*3/uL — ABNORMAL LOW (ref 4.0–10.5)
nRBC: 0 % (ref 0.0–0.2)

## 2020-10-21 LAB — BASIC METABOLIC PANEL
Anion gap: 9 (ref 5–15)
Anion gap: 8 (ref 5–15)
BUN: 12 mg/dL (ref 6–20)
BUN: 13 mg/dL (ref 6–20)
CO2: 22 mmol/L (ref 22–32)
CO2: 23 mmol/L (ref 22–32)
Calcium: 8.4 mg/dL — ABNORMAL LOW (ref 8.9–10.3)
Calcium: 8.2 mg/dL — ABNORMAL LOW (ref 8.9–10.3)
Chloride: 98 mmol/L (ref 98–111)
Chloride: 99 mmol/L (ref 98–111)
Creatinine, Ser: 1.35 mg/dL — ABNORMAL HIGH (ref 0.61–1.24)
Creatinine, Ser: 1.35 mg/dL — ABNORMAL HIGH (ref 0.61–1.24)
GFR, Estimated: >60 mL/min (ref >60)
GFR, Estimated: >60 mL/min (ref >60)
Glucose, Bld: 119 mg/dL — ABNORMAL HIGH (ref 70–99)
Glucose, Bld: 139 mg/dL — ABNORMAL HIGH (ref 70–99)
Potassium: 4.9 mmol/L (ref 3.5–5.1)
Potassium: 4.9 mmol/L (ref 3.5–5.1)
Sodium: 129 mmol/L — ABNORMAL LOW (ref 135–145)
Sodium: 130 mmol/L — ABNORMAL LOW (ref 135–145)

## 2020-10-21 LAB — GLUCOSE, CAPILLARY
Glucose-Capillary: 104 mg/dL — ABNORMAL HIGH (ref 70–99)
Glucose-Capillary: 113 mg/dL — ABNORMAL HIGH (ref 70–99)
Glucose-Capillary: 124 mg/dL — ABNORMAL HIGH (ref 70–99)
Glucose-Capillary: 139 mg/dL — ABNORMAL HIGH (ref 70–99)
Glucose-Capillary: 139 mg/dL — ABNORMAL HIGH (ref 70–99)
Glucose-Capillary: 150 mg/dL — ABNORMAL HIGH (ref 70–99)

## 2020-10-21 MED ORDER — ONDANSETRON HCL 4 MG PO TABS
4.0000 mg | ORAL_TABLET | Freq: Three times a day (TID) | ORAL | Status: DC | PRN
  Administered 2020-10-21: 4 mg via ORAL
  Filled 2020-10-21: qty 1

## 2020-10-21 MED ORDER — ONDANSETRON HCL 4 MG PO TABS: 4.0000 mg | ORAL_TABLET | Freq: Three times a day (TID) | ORAL | 0 refills | Status: DC | PRN

## 2020-10-21 MED ORDER — FUROSEMIDE 40 MG PO TABS
40.0000 mg | ORAL_TABLET | Freq: Every day | ORAL | 2 refills | Status: DC
Start: 2020-10-21 — End: 2020-10-22

## 2020-10-21 MED ORDER — PANTOPRAZOLE SODIUM 40 MG PO TBEC: 40.0000 mg | DELAYED_RELEASE_TABLET | Freq: Every day | ORAL | 0 refills | Status: DC

## 2020-10-21 NOTE — Discharge Summary (Addendum)
Physician Discharge Summary  Patient ID: Brad [name] MRN: [id number] DOB/AGE: 03/10/1961 59 y.o.  Admit date: 10/17/2020 Discharge date: 10/21/2020  Admission Diagnoses:  Discharge Diagnoses:  Principal Problem:   CHF exacerbation ([place]) Active Problems:   CKD (chronic kidney disease) stage 3, GFR 30-59 ml/min (HCC)   Chronic atrial fibrillation   Hyponatremia   Chest pain   Discharged Condition: good  Hospital Course:  Brad [name] a 59 y.o.malewho presented to the ED on 10/27/2021with multiple complaints including progressive weakness, frequent muscle jerks, worsening swelling and diarrheasince his last discharge 5 days prior. He also complained of frequent myoclonic jerks. PMH significant for T2DM, HTN, HLD, permanent A. Fib, chronic combined systolic and diastolic CHF, CKD stage III, polysubstance abuse, hep C, alcoholic liver cirrhosis, depression, GERD. He was recently hospitalized 10/18-10/22 for CHF exacerbation and hyponatremia. He required IV Lasix, hypertonic saline and tolvaptan at that time patient. He has had multiple prior admissions for recurrent hyponatremia thought to be due to alcohol abuse, liver cirrhosis and portal hypertension. Patient continues to drink 2 cans of beer daily and continues to use cocaine.UDS positive for cocaine. Patient reports that  In the ED,initial work-up showed sodium level low at 119,serum osmolality low at 269, Urine osmolality 378 CT abdomen and pelvis showed liver cirrhosis, progressive anasarca and Moderate right and small left pleural effusion worse compared to the scan from 10 days prior.  Patient admitted to hospitalist service for further evaluation management.  #1. Acute on chronic combined systolic and diastolic congestive heart failure Portal hypertension and anasarca, alcoholic liver cirrhosis. Patient has ejection fraction 30 to 35%. Patient was initially given IV Lasix. Lasix discontinued due to  worsening renal function. Patient appears to be euvolemic at this time. No evidence of volume overload. Continue Lasix orally, but reduce dose to 40 mg daily.  #2. Acute on chronic hypovolemic hyponatremia. Condition partially due to diarrhea. .Patient does not have leukocytosis. C. difficile toxin ordered on 28th, but diarrhea has improved. Sodium level so far has been stable at 129.  Continue fluid restriction after discharge.   #3 alcoholic liver cirrhosis, chronic alcohol abuse. Patient does not have any evidence of withdrawal.   4. Chest pain. Patient has history of cocaine abuse, no additional work-up is needed.  5. Permanent atrial fibrillation. Heart rate under control. Patient was not placed on anticoagulation due to fall risk and alcohol abuse chronically. Continue amiodarone.  #6. Type 2 diabetes with diabetic neuropathy. Resume home medicines.  #7. leukopenia. Secondary to alcohol liver cirrhosis.  #8.  Abdominal cramping pain with diarrhea. I do not see any evidence of peritonitis, he has received 3 days of IV Rocephin, will discontinue.  Diarrhea has been better.  Also added Protonix.  #9.  Acute kidney injury secondary to Lasix. Renal function improving.  Addendum: 8588. Patient now states he is nauseous, not feeling like going home. Will keep one more night, recheck labs and BNP. Plan to dc tomorrow.   Consults: None  Significant Diagnostic Studies:  CT ABDOMEN AND PELVIS WITHOUT CONTRAST  TECHNIQUE: Multidetector CT imaging of the abdomen and pelvis was performed following the standard protocol without IV contrast.  COMPARISON:  Ten days ago  FINDINGS: Lower chest: Moderate right pleural effusion and small left pleural effusion, increased. Mild associated atelectasis. Chronic cardiomegaly.  Hepatobiliary: Cirrhotic liver morphology. No gross mass lesion.Dependent high-density gallbladder contents without calcified stone or acute  inflammation.  Pancreas: Generalized atrophy.  Spleen: Unremarkable.  Adrenals/Urinary Tract: Negative adrenals. No hydronephrosis or stone.  Unremarkable bladder for the degree of distension.  Stomach/Bowel: No obstruction. No appendicitis or other visible inflammation.  Vascular/Lymphatic: No acute vascular abnormality. Diffuse atheromatous calcification. No mass or adenopathy.  Reproductive:Chronic prostate enlargement  Other: No ascites or pneumoperitoneum.  Anasarca  Musculoskeletal: No acute abnormalities.  IMPRESSION: 1. Moderate right and small left pleural effusion which has increased from 10 days ago. There is also progressive anasarca. 2. Cirrhosis.   Electronically Signed   By: Monte Fantasia M.D.   On: 10/18/2020 07:24   Treatments: Lasix, rocephin  Discharge Exam: Blood pressure 113/77, pulse 73, temperature 97.7 F (36.5 C), temperature source Oral, resp. rate 18, weight 82.2 kg, SpO2 96 %. General appearance: alert and cooperative Resp: Decreased breathing sounds without crackles or wheezes. Cardio: regular rate and rhythm, S1, S2 normal, no murmur, click, rub or gallop GI: Soft, not signal tenderness, no rebound tenderness.  No significant distention. Extremities: extremities normal, atraumatic, no cyanosis or edema  Disposition: Discharge disposition: 01-Home or Self Care       Discharge Instructions    Diet - low sodium heart healthy   Complete by: As directed    Fluid restriction 1559m/day   Increase activity slowly   Complete by: As directed      Allergies as of 10/21/2020      Reactions   Angiotensin Receptor Blockers Anaphylaxis, Other (See Comments)   (Angioedema also with Lisinopril, therefore ARB's are contraindicated)   Lisinopril Anaphylaxis, Swelling   Throat swelling   Pamelor [nortriptyline Hcl] Anaphylaxis, Swelling   Throat swells      Medication List    TAKE these medications   Accu-Chek Guide Me  w/Device Kit Use as instructed daily   Accu-Chek Guide test strip Generic drug: glucose blood Use as instructed daily   amiodarone 200 MG tablet Commonly known as: PACERONE Take 1 tablet (200 mg total) by mouth daily.   atorvastatin 20 MG tablet Commonly known as: LIPITOR Take 1 tablet (20 mg total) by mouth daily at 6 PM.   dapagliflozin propanediol 5 MG Tabs tablet Commonly known as: Farxiga Take 1 tablet (5 mg total) by mouth daily before breakfast.   DULoxetine 60 MG capsule Commonly known as: Cymbalta Take 1 capsule (60 mg total) by mouth daily.   folic acid 1 MG tablet Commonly known as: FOLVITE Take 1 mg by mouth daily.   furosemide 40 MG tablet Commonly known as: LASIX Take 1 tablet (40 mg total) by mouth daily. What changed: when to take this   gabapentin 300 MG capsule Commonly known as: NEURONTIN 2 caps in the morning and 3 caps in the evening What changed:   how much to take  how to take this  when to take this  additional instructions   isosorbide-hydrALAZINE 20-37.5 MG tablet Commonly known as: BIDIL Take 1 tablet by mouth 3 (three) times daily.   pantoprazole 40 MG tablet Commonly known as: Protonix Take 1 tablet (40 mg total) by mouth daily.   potassium chloride SA 20 MEQ tablet Commonly known as: KLOR-CON Take 1 tablet (20 mEq total) by mouth daily.   sodium chloride 1 g tablet Take 2 tablets (2 g total) by mouth 3 (three) times daily with meals.   thiamine 100 MG tablet Take 1 tablet (100 mg total) by mouth daily.       Follow-up Information    NCharlott Rakes MD Follow up in 1 week(s).   Specialty: Family Medicine Contact information: [id number]  [phone]        [name], MD Follow up in 2 week(s).   Specialty: Cardiology Contact information: [place] [address] [phone]              36 minutes Signed: Sharen Hones 10/21/2020,  11:07 AM

## 2020-10-21 NOTE — Discharge Instructions (Signed)
1.  Heart healthy diet. 2.  Fluid restriction less than 1500 mL a day. 3.  Quit drugs, alcohol and tobacco products. 4.  Follow-up with PCP and family doctor in the near future.

## 2020-10-22 ENCOUNTER — Other Ambulatory Visit (HOSPITAL_COMMUNITY): Payer: Self-pay | Admitting: Family Medicine

## 2020-10-22 LAB — CBC WITH DIFFERENTIAL/PLATELET
Abs Immature Granulocytes: 0 10*3/uL (ref 0.00–0.07)
Basophils Absolute: 0 10*3/uL (ref 0.0–0.1)
Basophils Relative: 1 %
Eosinophils Absolute: 0.1 10*3/uL (ref 0.0–0.5)
Eosinophils Relative: 3 %
HCT: 31.8 % — ABNORMAL LOW (ref 39.0–52.0)
Hemoglobin: 10.3 g/dL — ABNORMAL LOW (ref 13.0–17.0)
Immature Granulocytes: 0 %
Lymphocytes Relative: 9 %
Lymphs Abs: 0.4 10*3/uL — ABNORMAL LOW (ref 0.7–4.0)
MCH: 28.9 pg (ref 26.0–34.0)
MCHC: 32.4 g/dL (ref 30.0–36.0)
MCV: 89.1 fL (ref 80.0–100.0)
Monocytes Absolute: 0.5 10*3/uL (ref 0.1–1.0)
Monocytes Relative: 12 %
Neutro Abs: 3.2 10*3/uL (ref 1.7–7.7)
Neutrophils Relative %: 75 %
Platelets: 217 10*3/uL (ref 150–400)
RBC: 3.57 MIL/uL — ABNORMAL LOW (ref 4.22–5.81)
RDW: 16.3 % — ABNORMAL HIGH (ref 11.5–15.5)
WBC: 4.2 10*3/uL (ref 4.0–10.5)
nRBC: 0 % (ref 0.0–0.2)

## 2020-10-22 LAB — GLUCOSE, CAPILLARY
Glucose-Capillary: 131 mg/dL — ABNORMAL HIGH (ref 70–99)
Glucose-Capillary: 145 mg/dL — ABNORMAL HIGH (ref 70–99)

## 2020-10-22 LAB — MAGNESIUM: Magnesium: 1.8 mg/dL (ref 1.7–2.4)

## 2020-10-22 LAB — BRAIN NATRIURETIC PEPTIDE: B Natriuretic Peptide: 975.6 pg/mL — ABNORMAL HIGH (ref 0.0–100.0)

## 2020-10-22 MED ORDER — LORAZEPAM 1 MG PO TABS
1.0000 mg | ORAL_TABLET | Freq: Once | ORAL | Status: AC
  Administered 2020-10-22: 1 mg via ORAL
  Filled 2020-10-22: qty 1

## 2020-10-22 MED ORDER — FUROSEMIDE 40 MG PO TABS
40.0000 mg | ORAL_TABLET | Freq: Two times a day (BID) | ORAL | Status: DC
  Administered 2020-10-22: 40 mg via ORAL
  Filled 2020-10-22: qty 1

## 2020-10-22 MED ORDER — DAPAGLIFLOZIN PROPANEDIOL 10 MG PO TABS
10.0000 mg | ORAL_TABLET | Freq: Every day | ORAL | 11 refills | Status: DC
Start: 2020-10-23 — End: 2021-01-27

## 2020-10-22 MED ORDER — FUROSEMIDE 40 MG PO TABS
40.0000 mg | ORAL_TABLET | Freq: Two times a day (BID) | ORAL | 3 refills | Status: DC
Start: 2020-10-22 — End: 2020-11-13

## 2020-10-22 MED ORDER — FUROSEMIDE 40 MG PO TABS
40.0000 mg | ORAL_TABLET | Freq: Two times a day (BID) | ORAL | 3 refills | Status: DC
Start: 2020-10-22 — End: 2020-10-22

## 2020-10-22 MED FILL — PANTOPRAZOLE SOD DR 40 MG T: 40 | 30 days supply | Qty: 30 | Fill #0

## 2020-10-22 MED FILL — FUROSEMIDE 40 MG TABLET: 40 | 30 days supply | Qty: 60 | Fill #0

## 2020-10-22 MED FILL — ONDANSETRON HCL 4 MG TABLET: 4 | 6 days supply | Qty: 20 | Fill #0

## 2020-10-22 NOTE — Progress Notes (Signed)
Patient still waiting on his ride to get here to take him home.

## 2020-10-22 NOTE — Progress Notes (Signed)
[place] [facility] PROGRESS NOTE    Brad [name]  BZJ:[id number] DOB: [birth year] DOA: 10/17/2020 PCP: Charlott Rakes, MD      Brief Narrative:  Brad [name] is a 59 y.o. M with CHF, hepatitis, IVDU, Afib, cirrhosis and DM who presented with diarrhea and dyspnea.  In the ED,initial work-up showed sodium level low at 119,serum osmolality low at 269, Urine osmolality 378 CT abdomen and pelvis showed liver cirrhosis, progressive anasarca and Moderate right and small left pleural effusion worse compared to the scan from 10 days prior.        Assessment & Plan:  Acute on chronic systolic and diastolic CHF Paitent presented with cardiomegaly on imaging, edema, dyspnea on exertion.  He was diuresed for 2 days, his creatinine increased, his diuretics were held for a day.  He was able to ambulate without dyspnea today, and was discharged back on his home regimen.  -Continue Lasix BID -Continue Farxiga -Continue Isordil   Atrial fibrillation, paroxysmal Not on blood thinner due to ongoing drug use -Continue amiodarone  Diabetes -Continue Farxiga -Continue insulin                 MDM: The below labs and imaging reports were reviewed and summarized above.  Medication management as above.  DVT prophylaxis:   Code Status: FULL Family Communication:          Subjective: Diarrhea improved.  Dyspnea is near baseline.  Mild swelling noted in shins.  No confusion, fever.  No chest pain.  Objective: Vitals:   10/21/20 2006 10/22/20 0059 10/22/20 0334 10/22/20 1149  BP: (!) 138/93  135/77   Pulse: 65  73   Resp: (!) 22  16 18   Temp: (!) 97.5 F (36.4 C)  98.4 F (36.9 C) 98.3 F (36.8 C)  TempSrc: Oral  Oral Oral  SpO2: 100%  97%   Weight:  84 kg      Intake/Output Summary (Last 24 hours) at 10/22/2020 1741 Last data filed at 10/22/2020 1200 Gross per 24 hour  Intake 1080 ml  Output 950 ml  Net 130 ml   Filed Weights   10/20/20  0431 10/21/20 0514 10/22/20 0059  Weight: 79.7 kg 82.2 kg 84 kg    Examination: General appearance:  adult male, alert and in no acute distress.   HEENT: Anicteric, conjunctiva pink, lids and lashes normal. No nasal deformity, discharge, epistaxis.  Lips moist.   Skin: Warm and dry.  no jaundice.  No suspicious rashes or lesions. Cardiac: RRR, nl S1-S2, no murmurs appreciated.  Capillary refill is brisk.  JVP not visible.  Trace pretibial LE edema.  Radial pulses 2+ and symmetric. Respiratory: Normal respiratory rate and rhythm.  CTAB without rales or wheezes. Abdomen: Abdomen soft.  no TTP. No ascites, distension, hepatosplenomegaly.   MSK: No deformities or effusions. Neuro: Awake and alert.  EOMI, moves all extremities. Speech fluent.    Psych: Sensorium intact and responding to questions, attention normal. Affect normal.  Judgment and insight appear normal.    Data Reviewed: I have personally reviewed following labs and imaging studies:  CBC: Recent Labs  Lab 10/17/20 2024 10/19/20 0341 10/20/20 1130 10/21/20 0940 10/22/20 0540  WBC 3.6* 2.5* 3.3* 3.9* 4.2  NEUTROABS 2.6 1.5* 2.0 2.5 3.2  HGB 13.5 9.7* 9.9* 11.4* 10.3*  HCT 41.9 28.9* 29.8* 34.9* 31.8*  MCV 87.5 84.3 85.4 87.7 89.1  PLT 324 213 219 209 017   Basic Metabolic Panel: Recent Labs  Lab 10/18/20 1028  10/19/20 0341 10/20/20 1130 10/21/20 0940 10/21/20 1649 10/22/20 0540  NA 121* 127* 129* 129* 130*  --   K 4.3 3.4* 3.9 4.9 4.9  --   CL 89* 94* 97* 98 99  --   CO2 20* 23 25 22 23   --   GLUCOSE 107* 116* 123* 119* 139*  --   BUN 10 12 13 12 13   --   CREATININE 1.24 1.37* 1.37* 1.35* 1.35*  --   CALCIUM 7.8* 7.6* 7.9* 8.4* 8.2*  --   MG  --  1.5* 2.0  --   --  1.8  PHOS  --  3.0  --   --   --   --    GFR: Estimated Creatinine Clearance: 62 mL/min (A) (by C-G formula based on SCr of 1.35 mg/dL (H)). Liver Function Tests: Recent Labs  Lab 10/17/20 2024  AST 36  ALT 31  ALKPHOS 55  BILITOT 0.7   PROT 6.3*  ALBUMIN 2.7*   Recent Labs  Lab 10/17/20 2024  LIPASE 27   Recent Labs  Lab 10/19/20 0341  AMMONIA 29   Coagulation Profile: No results for input(s): INR, PROTIME in the last 168 hours. Cardiac Enzymes: No results for input(s): CKTOTAL, CKMB, CKMBINDEX, TROPONINI in the last 168 hours. BNP (last 3 results) No results for input(s): PROBNP in the last 8760 hours. HbA1C: No results for input(s): HGBA1C in the last 72 hours. CBG: Recent Labs  Lab 10/21/20 1130 10/21/20 1513 10/21/20 2118 10/22/20 0558 10/22/20 1611  GLUCAP 104* 139* 139* 131* 145*   Lipid Profile: No results for input(s): CHOL, HDL, LDLCALC, TRIG, CHOLHDL, LDLDIRECT in the last 72 hours. Thyroid Function Tests: No results for input(s): TSH, T4TOTAL, FREET4, T3FREE, THYROIDAB in the last 72 hours. Anemia Panel: No results for input(s): VITAMINB12, FOLATE, FERRITIN, TIBC, IRON, RETICCTPCT in the last 72 hours. Urine analysis:    Component Value Date/Time   COLORURINE YELLOW 10/17/2020 2245   APPEARANCEUR CLEAR 10/17/2020 2245   LABSPEC 1.012 10/17/2020 2245   PHURINE 5.0 10/17/2020 2245   GLUCOSEU >=500 (A) 10/17/2020 2245   HGBUR SMALL (A) 10/17/2020 2245   BILIRUBINUR NEGATIVE 10/17/2020 2245   BILIRUBINUR neg 05/08/2017 1503   KETONESUR NEGATIVE 10/17/2020 2245   PROTEINUR 100 (A) 10/17/2020 2245   UROBILINOGEN 0.2 05/08/2017 1503   UROBILINOGEN 1.0 04/26/2012 1328   NITRITE NEGATIVE 10/17/2020 2245   LEUKOCYTESUR NEGATIVE 10/17/2020 2245   Sepsis Labs: @LABRCNTIP (procalcitonin:4,lacticacidven:4)  ) Recent Results (from the past 240 hour(s))  Resp Panel by RT PCR (RSV, Flu A&B, Covid) - Nasopharyngeal Swab     Status: None   Collection Time: 10/17/20  8:26 PM   Specimen: Nasopharyngeal Swab  Result Value Ref Range Status   SARS Coronavirus 2 by RT PCR NEGATIVE NEGATIVE Final    Comment: (NOTE) SARS-CoV-2 target nucleic acids are NOT DETECTED.  The SARS-CoV-2 RNA is  generally detectable in upper respiratoy specimens during the acute phase of infection. The lowest concentration of SARS-CoV-2 viral copies this assay can detect is 131 copies/mL. A negative result does not preclude SARS-Cov-2 infection and should not be used as the sole basis for treatment or other patient management decisions. A negative result may occur with  improper specimen collection/handling, submission of specimen other than nasopharyngeal swab, presence of viral mutation(s) within the areas targeted by this assay, and inadequate number of viral copies (<131 copies/mL). A negative result must be combined with clinical observations, patient history, and epidemiological information. The expected result  is Negative.  Fact Sheet for Patients:  PinkCheek.be  Fact Sheet for Healthcare Providers:  GravelBags.it  This test is no t yet approved or cleared by the [place] FDA and  has been authorized for detection and/or diagnosis of SARS-CoV-2 by FDA under an Emergency Use Authorization (EUA). This EUA will remain  in effect (meaning this test can be used) for the duration of the COVID-19 declaration under Section 564(b)(1) of the Act, 21 U.S.C. section 360bbb-3(b)(1), unless the authorization is terminated or revoked sooner.     Influenza A by PCR NEGATIVE NEGATIVE Final   Influenza B by PCR NEGATIVE NEGATIVE Final    Comment: (NOTE) The Xpert Xpress SARS-CoV-2/FLU/RSV assay is intended as an aid in  the diagnosis of influenza from Nasopharyngeal swab specimens and  should not be used as a sole basis for treatment. Nasal washings and  aspirates are unacceptable for Xpert Xpress SARS-CoV-2/FLU/RSV  testing.  Fact Sheet for Patients: PinkCheek.be  Fact Sheet for Healthcare Providers: GravelBags.it  This test is not yet approved or cleared by the [place] FDA and  has been authorized for detection and/or diagnosis of SARS-CoV-2 by  FDA under an Emergency Use Authorization (EUA). This EUA will remain  in effect (meaning this test can be used) for the duration of the  Covid-19 declaration under Section 564(b)(1) of the Act, 21  U.S.C. section 360bbb-3(b)(1), unless the authorization is  terminated or revoked.    Respiratory Syncytial Virus by PCR NEGATIVE NEGATIVE Final    Comment: (NOTE) Fact Sheet for Patients: PinkCheek.be  Fact Sheet for Healthcare Providers: GravelBags.it  This test is not yet approved or cleared by the [place] FDA and  has been authorized for detection and/or diagnosis of SARS-CoV-2 by  FDA under an Emergency Use Authorization (EUA). This EUA will remain  in effect (meaning this test can be used) for the duration of the  COVID-19 declaration under Section 564(b)(1) of the Act, 21 U.S.C.  section 360bbb-3(b)(1), unless the authorization is terminated or  revoked. Performed at [facility], [place] [address] [place], [place] [address]          Radiology Studies: No results found.      Scheduled Meds: . amiodarone  200 mg Oral Daily  . dapagliflozin propanediol  10 mg Oral QAC breakfast  . DULoxetine  60 mg Oral Daily  . enoxaparin (LOVENOX) injection  40 mg Subcutaneous Q24H  . folic acid  1 mg Oral Daily  . furosemide  40 mg Oral BID  . gabapentin  600 mg Oral BID  . insulin aspart  0-5 Units Subcutaneous QHS  . insulin aspart  0-9 Units Subcutaneous TID WC  . isosorbide-hydrALAZINE  1 tablet Oral TID  . multivitamin with minerals  1 tablet Oral Daily  . potassium chloride SA  20 mEq Oral Daily  . sodium chloride  2 g Oral TID WC  . thiamine  100 mg Oral Daily   Continuous Infusions:   LOS: 5 days    Time spent: 25 minutes    Edwin Dada, MD [facility] 10/22/2020, 5:41 PM     Please page  though [facility] or Epic secure chat:  For [employer], Adult nurse

## 2020-10-22 NOTE — Progress Notes (Signed)
Physical Therapy Treatment Patient Details Name: Brad [name] MRN: [id number] DOB: 02/16/1961 Today's Date: 10/22/2020    History of Present Illness 59 y.o. male who presented to the ED on 10/17/2020 with multiple complaints including progressive weakness, frequent muscle jerks, worsening swelling and diarrhea. PMH significant for T2DM, HTN, HLD, permanent A. Fib, chronic combined systolic and diastolic CHF, CKD stage III, polysubstance abuse, hep C, alcoholic liver cirrhosis, depression, GERD. PT with recurrent admissions for hyponatremia 2/2 alcohol abuse. Pt admitted for CHF exacerbation, hyponatremia, and myocolonal jerking.    PT Comments    Patient progressing towards physical therapy goals. Patient not agreeable to therapy upon arrival due to being discharged today, encouraged patient about benefits of mobility and need to assess prior to returning home for safety. Patient agreeable to do participate in short walk. Patient ambulated 150' with SPC and min guard, cues for avoiding obstacles on R in hallway. Patient with limited environmental scanning, encouraged scanning at home and in community for safety and preventing unintentional fall or injury. Patient demos impaired vision with increased blurry vision on R compared to L. Patient continues to present with generalized weakness, impaired balance, and decreased activity tolerance. Continue to recommend OPPT following discharge to address balance deficits.   Follow Up Recommendations  Outpatient PT (balance)     Equipment Recommendations  None recommended by PT (pt owns necessary equipment)    Recommendations for Other Services       Precautions / Restrictions Precautions Precautions: Fall Restrictions Weight Bearing Restrictions: No    Mobility  Bed Mobility Overal bed mobility: Modified Independent                Transfers Overall transfer level: Needs assistance Equipment used: None Transfers: Sit to/from  Stand Sit to Stand: Supervision            Ambulation/Gait Ambulation/Gait assistance: Min guard Gait Distance (Feet): 150 Feet Assistive device: Straight cane Gait Pattern/deviations: Step-through pattern;Staggering right     General Gait Details: Pt with limited environmental scanning during ambulation with running into obstacles x 3 on R side. Assessed vision with patient having increased difficulty and blurry vision on R compared to L. Educated patient about scanning environment at home and community to prevent unintentional fall or injury.    Stairs             Wheelchair Mobility    Modified [name] (Stroke Patients Only)       Balance Overall balance assessment: Needs assistance Sitting-balance support: Feet supported;No upper extremity supported Sitting balance-Leahy Scale: Good     Standing balance support: During functional activity;Single extremity supported Standing balance-Leahy Scale: Poor                              Cognition Arousal/Alertness: Awake/alert Behavior During Therapy: WFL for tasks assessed/performed Overall Cognitive Status: Within Functional Limits for tasks assessed                                        Exercises      General Comments        Pertinent Vitals/Pain Pain Assessment: No/denies pain    Home Living                      Prior Function  PT Goals (current goals can now be found in the care plan section) Acute Rehab PT Goals Patient Stated Goal: to go home PT Goal Formulation: With patient Time For Goal Achievement: 11/01/20 Potential to Achieve Goals: Good Progress towards PT goals: Progressing toward goals    Frequency    Min 3X/week      PT Plan Current plan remains appropriate    Co-evaluation              AM-PAC PT "6 Clicks" Mobility   Outcome Measure  Help needed turning from your back to your side while in a flat bed without using  bedrails?: None Help needed moving from lying on your back to sitting on the side of a flat bed without using bedrails?: None Help needed moving to and from a bed to a chair (including a wheelchair)?: None Help needed standing up from a chair using your arms (e.g., wheelchair or bedside chair)?: None Help needed to walk in hospital room?: A Little Help needed climbing 3-5 steps with a railing? : A Lot 6 Click Score: 21    End of Session Equipment Utilized During Treatment: Gait belt Activity Tolerance: Patient tolerated treatment well Patient left: in bed;with call bell/phone within reach;with bed alarm set Nurse Communication: Mobility status PT Visit Diagnosis: Unsteadiness on feet (R26.81);Other abnormalities of gait and mobility (R26.89)     Time: 9892-1194 PT Time Calculation (min) (ACUTE ONLY): 11 min  Charges:  $Therapeutic Activity: 8-22 mins                     Perrin Maltese, PT, DPT Acute Rehabilitation Services Pager [phone] Office [phone]    Chesley Noon K Allred 10/22/2020, 11:11 AM

## 2020-10-22 NOTE — Progress Notes (Signed)
Heart Failure Patient Advocate Encounter   Received notification from [facility] Medicaid that prior authorization for [place]ade is required.   PA submitted on CoverMyMeds Key [id number] Status is pending   Will continue to follow.  Kerby Nora, PharmD, BCPS Heart Failure Stewardship Pharmacist Phone [phone] 10/22/2020       10:45 AM  Please check [email or web address] for unit-specific pharmacist phone numbers

## 2020-10-22 NOTE — Progress Notes (Signed)
Discharge teaching complete. Meds, diet, activity, follow up appointments reviewed and all questions answered. Copy of instructions given to patient.  

## 2020-10-22 NOTE — TOC Progression Note (Signed)
Transition of Care [facility]) - Progression Note    Patient Details  Name: Brad [name] MRN: [id number] Date of Birth: January 02, 1961  [facility]) CM/SW Contact  Zenon Mayo, RN Phone Number: 10/22/2020, 9:56 AM  Clinical Narrative:    Patient is being followed thru the paramedicine program with Heart Failure, and CSW with HF team spoke to patient about SA also .          Expected Discharge Plan and Services           Expected Discharge Date: 10/21/20                                     Social Determinants of Health (SDOH) Interventions    Readmission Risk Interventions Readmission Risk Prevention Plan 08/17/2020 07/23/2020 06/13/2020  Transportation Screening Complete Complete Complete  PCP or Specialist Appt within 5-7 Days - - -  Home Care Screening - - -  Medication Review (RN CM) - - -  Medication Review (RN Transport planner) - Referral to Pharmacy Complete  PCP or Specialist appointment within 3-5 days of discharge Complete Complete Complete  PCP/Specialist Appt Not Complete comments - - -  [place] or Home Care Consult Complete Complete Complete  HRI or Home Care Consult Pt Refusal Comments - - -  SW Recovery Care/Counseling Consult Complete Complete Complete  Palliative Care Screening Not Applicable Not Applicable Not Applicable  Skilled Nursing Facility Not Applicable Complete Complete  Some recent data might be hidden

## 2020-10-23 ENCOUNTER — Telehealth (HOSPITAL_COMMUNITY): Payer: Self-pay

## 2020-10-23 ENCOUNTER — Telehealth (HOSPITAL_COMMUNITY): Payer: Self-pay | Admitting: Licensed Clinical Social Worker

## 2020-10-23 ENCOUNTER — Telehealth: Payer: Self-pay

## 2020-10-23 NOTE — Telephone Encounter (Signed)
Heart Failure Patient Advocate Encounter   Received notification from [facility] Medicaid that prior authorization for Farxiga 10 mg daily is required.   PA has been approved  Kerby Nora, PharmD, BCPS Heart Failure Cytogeneticist Phone [phone]

## 2020-10-23 NOTE — Telephone Encounter (Signed)
Multiple attempts to reach [name] without success. No option to leave message. I texted same number in attempt to reach patient. I will drive by apartment tomorrow to check on him.

## 2020-10-23 NOTE — Telephone Encounter (Addendum)
CSW called pt to follow up regarding inpatient alcohol rehab as patient was DC'd from the hospital yesterday.  Unable to reach and unable to leave VM  CSW had also spoken with pt while in the hospital about getting reestablished with [facility] for therapy- pt had liked them in the past and wants to continue seeing them rather than pursuing a different therapy office.  CSW called Beverly Sessions who reports pt has not been to therapy appt for over a year so patient would need to start from scratch- would need to call in M-F between 8am-3pm to start intake process.  Will continue to follow and assist as needed  [name], [facility] Desk#: [phone] Cell#: [phone]

## 2020-10-23 NOTE — Telephone Encounter (Signed)
Transition Care Management Unsuccessful Follow-up Telephone Call  Date of discharge and from where:  10/22/2020, [facility]   Attempts:  1st Attempt  Reason for unsuccessful TCM follow-up call:  Unable to leave message - calls placed x2 # [phone], message stated that the call was not able to be completed at this time   He has an appointment with Dr Margarita Rana 11/14/2020.

## 2020-10-24 ENCOUNTER — Inpatient Hospital Stay (HOSPITAL_COMMUNITY)
Admission: EM | Admit: 2020-10-24 | Discharge: 2020-10-27 | DRG: 291 | Disposition: A | Discharge status: Home / Self Care | Payer: Medicaid Other | Attending: Internal Medicine | Admitting: Internal Medicine

## 2020-10-24 ENCOUNTER — Other Ambulatory Visit: Payer: Self-pay

## 2020-10-24 ENCOUNTER — Encounter (HOSPITAL_COMMUNITY): Payer: Self-pay | Admitting: Emergency Medicine

## 2020-10-24 ENCOUNTER — Emergency Department (HOSPITAL_COMMUNITY): Payer: Medicaid Other

## 2020-10-24 ENCOUNTER — Telehealth: Payer: Self-pay

## 2020-10-24 DIAGNOSIS — M542 Cervicalgia: Secondary | ICD-10-CM | POA: Diagnosis present

## 2020-10-24 DIAGNOSIS — K7460 Unspecified cirrhosis of liver: Secondary | ICD-10-CM | POA: Diagnosis present

## 2020-10-24 DIAGNOSIS — W1830XA Fall on same level, unspecified, initial encounter: Secondary | ICD-10-CM | POA: Diagnosis present

## 2020-10-24 DIAGNOSIS — E1169 Type 2 diabetes mellitus with other specified complication: Secondary | ICD-10-CM | POA: Diagnosis present

## 2020-10-24 DIAGNOSIS — N183 Chronic kidney disease, stage 3 unspecified: Secondary | ICD-10-CM | POA: Diagnosis present

## 2020-10-24 DIAGNOSIS — F1010 Alcohol abuse, uncomplicated: Secondary | ICD-10-CM | POA: Diagnosis present

## 2020-10-24 DIAGNOSIS — Z20822 Contact with and (suspected) exposure to covid-19: Secondary | ICD-10-CM | POA: Diagnosis present

## 2020-10-24 DIAGNOSIS — I5043 Acute on chronic combined systolic (congestive) and diastolic (congestive) heart failure: Secondary | ICD-10-CM | POA: Diagnosis present

## 2020-10-24 DIAGNOSIS — E871 Hypo-osmolality and hyponatremia: Secondary | ICD-10-CM | POA: Diagnosis present

## 2020-10-24 DIAGNOSIS — K219 Gastro-esophageal reflux disease without esophagitis: Secondary | ICD-10-CM | POA: Diagnosis present

## 2020-10-24 DIAGNOSIS — B182 Chronic viral hepatitis C: Secondary | ICD-10-CM | POA: Diagnosis present

## 2020-10-24 DIAGNOSIS — F1910 Other psychoactive substance abuse, uncomplicated: Secondary | ICD-10-CM | POA: Diagnosis present

## 2020-10-24 DIAGNOSIS — I4821 Permanent atrial fibrillation: Secondary | ICD-10-CM | POA: Diagnosis present

## 2020-10-24 DIAGNOSIS — I4892 Unspecified atrial flutter: Secondary | ICD-10-CM | POA: Diagnosis present

## 2020-10-24 DIAGNOSIS — F32A Depression, unspecified: Secondary | ICD-10-CM | POA: Diagnosis present

## 2020-10-24 DIAGNOSIS — E1122 Type 2 diabetes mellitus with diabetic chronic kidney disease: Secondary | ICD-10-CM | POA: Diagnosis present

## 2020-10-24 DIAGNOSIS — I509 Heart failure, unspecified: Secondary | ICD-10-CM

## 2020-10-24 DIAGNOSIS — I130 Hypertensive heart and chronic kidney disease with heart failure and stage 1 through stage 4 chronic kidney disease, or unspecified chronic kidney disease: Principal | ICD-10-CM | POA: Diagnosis present

## 2020-10-24 DIAGNOSIS — E119 Type 2 diabetes mellitus without complications: Secondary | ICD-10-CM

## 2020-10-24 DIAGNOSIS — R188 Other ascites: Secondary | ICD-10-CM | POA: Diagnosis present

## 2020-10-24 DIAGNOSIS — Z981 Arthrodesis status: Secondary | ICD-10-CM

## 2020-10-24 DIAGNOSIS — Z79899 Other long term (current) drug therapy: Secondary | ICD-10-CM

## 2020-10-24 DIAGNOSIS — D649 Anemia, unspecified: Secondary | ICD-10-CM | POA: Diagnosis present

## 2020-10-24 DIAGNOSIS — Z9119 Patient's noncompliance with other medical treatment and regimen: Secondary | ICD-10-CM

## 2020-10-24 DIAGNOSIS — F1410 Cocaine abuse, uncomplicated: Secondary | ICD-10-CM | POA: Diagnosis present

## 2020-10-24 DIAGNOSIS — Z8673 Personal history of transient ischemic attack (TIA), and cerebral infarction without residual deficits: Secondary | ICD-10-CM

## 2020-10-24 DIAGNOSIS — W19XXXA Unspecified fall, initial encounter: Secondary | ICD-10-CM

## 2020-10-24 DIAGNOSIS — I252 Old myocardial infarction: Secondary | ICD-10-CM

## 2020-10-24 DIAGNOSIS — F17210 Nicotine dependence, cigarettes, uncomplicated: Secondary | ICD-10-CM | POA: Diagnosis present

## 2020-10-24 DIAGNOSIS — Z8249 Family history of ischemic heart disease and other diseases of the circulatory system: Secondary | ICD-10-CM

## 2020-10-24 DIAGNOSIS — I428 Other cardiomyopathies: Secondary | ICD-10-CM | POA: Diagnosis present

## 2020-10-24 DIAGNOSIS — M869 Osteomyelitis, unspecified: Secondary | ICD-10-CM | POA: Diagnosis present

## 2020-10-24 DIAGNOSIS — I2510 Atherosclerotic heart disease of native coronary artery without angina pectoris: Secondary | ICD-10-CM | POA: Diagnosis present

## 2020-10-24 DIAGNOSIS — R079 Chest pain, unspecified: Secondary | ICD-10-CM

## 2020-10-24 DIAGNOSIS — Z888 Allergy status to other drugs, medicaments and biological substances status: Secondary | ICD-10-CM

## 2020-10-24 DIAGNOSIS — Z8546 Personal history of malignant neoplasm of prostate: Secondary | ICD-10-CM

## 2020-10-24 DIAGNOSIS — I25118 Atherosclerotic heart disease of native coronary artery with other forms of angina pectoris: Secondary | ICD-10-CM | POA: Diagnosis present

## 2020-10-24 LAB — CBC
HCT: 38.4 % — ABNORMAL LOW (ref 39.0–52.0)
Hemoglobin: 12.4 g/dL — ABNORMAL LOW (ref 13.0–17.0)
MCH: 28.8 pg (ref 26.0–34.0)
MCHC: 32.3 g/dL (ref 30.0–36.0)
MCV: 89.3 fL (ref 80.0–100.0)
Platelets: 275 10*3/uL (ref 150–400)
RBC: 4.3 MIL/uL (ref 4.22–5.81)
RDW: 16 % — ABNORMAL HIGH (ref 11.5–15.5)
WBC: 4 10*3/uL (ref 4.0–10.5)
nRBC: 0 % (ref 0.0–0.2)

## 2020-10-24 LAB — URINALYSIS, ROUTINE W REFLEX MICROSCOPIC
Bacteria, UA: NONE SEEN
Bilirubin Urine: NEGATIVE
Glucose, UA: >=500 mg/dL — AB
Ketones, ur: NEGATIVE mg/dL
Leukocytes,Ua: NEGATIVE
Nitrite: NEGATIVE
Protein, ur: 30 mg/dL — AB
Specific Gravity, Urine: 1.001 — ABNORMAL LOW (ref 1.005–1.030)
pH: 5 (ref 5.0–8.0)

## 2020-10-24 LAB — RAPID URINE DRUG SCREEN, HOSP PERFORMED
Amphetamines: NOT DETECTED
Barbiturates: NOT DETECTED
Benzodiazepines: NOT DETECTED
Cocaine: POSITIVE — AB
Opiates: NOT DETECTED
Tetrahydrocannabinol: NOT DETECTED

## 2020-10-24 LAB — BASIC METABOLIC PANEL
Anion gap: 11 (ref 5–15)
BUN: 13 mg/dL (ref 6–20)
CO2: 19 mmol/L — ABNORMAL LOW (ref 22–32)
Calcium: 8.2 mg/dL — ABNORMAL LOW (ref 8.9–10.3)
Chloride: 92 mmol/L — ABNORMAL LOW (ref 98–111)
Creatinine, Ser: 1.12 mg/dL (ref 0.61–1.24)
GFR, Estimated: >60 mL/min (ref >60)
Glucose, Bld: 125 mg/dL — ABNORMAL HIGH (ref 70–99)
Potassium: 4.4 mmol/L (ref 3.5–5.1)
Sodium: 122 mmol/L — ABNORMAL LOW (ref 135–145)

## 2020-10-24 LAB — TROPONIN I (HIGH SENSITIVITY)
Troponin I (High Sensitivity): 27 ng/L — ABNORMAL HIGH (ref <18)
Troponin I (High Sensitivity): 29 ng/L — ABNORMAL HIGH (ref <18)

## 2020-10-24 LAB — OSMOLALITY, URINE: Osmolality, Ur: 139 mOsm/kg — ABNORMAL LOW (ref 300–900)

## 2020-10-24 LAB — BRAIN NATRIURETIC PEPTIDE: B Natriuretic Peptide: 834 pg/mL — ABNORMAL HIGH (ref 0.0–100.0)

## 2020-10-24 MED ORDER — ONDANSETRON HCL 4 MG/2ML IJ SOLN
4.0000 mg | Freq: Once | INTRAMUSCULAR | Status: AC
  Administered 2020-10-24: 4 mg via INTRAVENOUS
  Filled 2020-10-24: qty 2

## 2020-10-24 MED ORDER — FUROSEMIDE 10 MG/ML IJ SOLN
40.0000 mg | Freq: Once | INTRAMUSCULAR | Status: AC
  Administered 2020-10-25: 40 mg via INTRAVENOUS
  Filled 2020-10-24: qty 4

## 2020-10-24 MED ORDER — VANCOMYCIN HCL 1500 MG/300ML IV SOLN
1500.0000 mg | Freq: Once | INTRAVENOUS | Status: AC
  Administered 2020-10-25: 1500 mg via INTRAVENOUS
  Filled 2020-10-24: qty 300

## 2020-10-24 MED ORDER — SODIUM CHLORIDE 0.9 % IV SOLN
2.0000 g | Freq: Once | INTRAVENOUS | Status: AC
  Administered 2020-10-25: 2 g via INTRAVENOUS
  Filled 2020-10-24: qty 2

## 2020-10-24 MED ORDER — HYDROMORPHONE HCL 1 MG/ML IJ SOLN
1.0000 mg | Freq: Once | INTRAMUSCULAR | Status: AC
  Administered 2020-10-24: 1 mg via INTRAVENOUS
  Filled 2020-10-24: qty 1

## 2020-10-24 MED ORDER — MORPHINE SULFATE (PF) 4 MG/ML IV SOLN
4.0000 mg | Freq: Once | INTRAVENOUS | Status: AC
  Administered 2020-10-25: 4 mg via INTRAVENOUS
  Filled 2020-10-24: qty 1

## 2020-10-24 MED ORDER — IOHEXOL 350 MG/ML SOLN
100.0000 mL | Freq: Once | INTRAVENOUS | Status: AC | PRN
  Administered 2020-10-24: 100 mL via INTRAVENOUS

## 2020-10-24 NOTE — Telephone Encounter (Signed)
Transition Care Management Unsuccessful Follow-up Telephone Call  Date of discharge and from where:  10/22/2020, [facility]   Attempts:  2nd Attempt  Reason for unsuccessful TCM follow-up call:  Unable to leave message  - call placed to # [phone], message continues to state that the call cannot be completed at this time.  He has an appointment with Dr Margarita Rana 11/12/2020

## 2020-10-24 NOTE — ED Triage Notes (Signed)
Pt BIB [facility] from home, c/o chest pain that started today. Pt reports hx pneumonia and CHF.

## 2020-10-24 NOTE — ED Notes (Signed)
Patient transported to CT 

## 2020-10-24 NOTE — ED Provider Notes (Signed)
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 (VANCOREADY) IVPB 1500 mg/300 mL (has no administration in time range)  HYDROmorphone (DILAUDID) injection 1 mg (1 mg Intravenous Given 10/24/20 2213)  ondansetron (ZOFRAN) injection 4 mg (4 mg Intravenous Given 10/24/20 2213)  iohexol (OMNIPAQUE) 350 MG/ML injection 100 mL (100 mLs Intravenous Contrast Given 10/24/20 2225)  furosemide (LASIX) injection 40 mg (40 mg Intravenous Given 10/25/20 0031)  morphine 4 MG/ML injection 4 mg (4 mg Intravenous Given 10/25/20 0032)    ED Course  I have reviewed the triage vital signs and the nursing notes.  Pertinent labs & imaging results that were available during my care of the patient were reviewed by me and considered in my medical decision making (see chart for details).  Clinical Course as of Oct 26 39  Wed Oct 24, 2020  2151 BP in right arm: 126/89. BP in left arm 145/81   [KW]    Clinical Course User Index [KW] Lewanda Rife   MDM  Rules/Calculators/A&P  History provided by patient with additional history obtained from chart review.    59 yo male presenting with sudden onset of chest and back pain. Afebrile, normotensive, no tachycardia or hypoxia.  My exam he is ill-appearing.  Because of in a ball in the stretcher.  He has tenderness to palpation of her sternum and entire abdomen with voluntary guarding.  No rigidity.  No back pain.  Blood pressures checked in bilateral arms with systolic pressure 863 right and 145 left.  Work-up was initiated in triage. EKG without stemi. Delta troponin flat. CBC without leukocytosis, hemoglobin 12.4 consistent with baseline.  CMP shows hyponatremia sodium of 122, low bicarb of 19, no renal insufficiency, normal anion gap.  BNP elevated to 834, this is improved compared to recently During recent hospital admission x 1 week ago when it was in the low 1000s. Chest xray shows cardiomegaly and vascular congestion.  UA without infection. UDS positive for cocaine.  With sudden onset of chest and abdominal pain DDx includes dissection.  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 No obvious abscess. Patient is also in florid heart failure.  Blood cultures ordered. Will give lasix and broad spec to cover for infection.    Patient care transferred to S. Petrucelli  at the end of my shift pending consult for unassigned admission.    Portions of this note were generated with Lobby[employer]. Dictation errors may occur despite best attempts at proofreading.     Final Clinical Impression(s) / ED Diagnoses Final diagnoses:  Chest pain, unspecified type  Acute on chronic congestive heart failure, unspecified heart failure type [facility])    Rx / DC Orders ED  Discharge Orders    None       Barrie Folk, PA-C 10/25/20 0040    Drenda Freeze, MD 10/25/20 [phone]

## 2020-10-25 ENCOUNTER — Encounter (HOSPITAL_COMMUNITY): Payer: Self-pay | Admitting: Internal Medicine

## 2020-10-25 DIAGNOSIS — F1010 Alcohol abuse, uncomplicated: Secondary | ICD-10-CM | POA: Diagnosis present

## 2020-10-25 DIAGNOSIS — I5043 Acute on chronic combined systolic (congestive) and diastolic (congestive) heart failure: Secondary | ICD-10-CM | POA: Diagnosis present

## 2020-10-25 DIAGNOSIS — Z8249 Family history of ischemic heart disease and other diseases of the circulatory system: Secondary | ICD-10-CM | POA: Diagnosis not present

## 2020-10-25 DIAGNOSIS — F1410 Cocaine abuse, uncomplicated: Secondary | ICD-10-CM | POA: Diagnosis present

## 2020-10-25 DIAGNOSIS — K7460 Unspecified cirrhosis of liver: Secondary | ICD-10-CM | POA: Diagnosis present

## 2020-10-25 DIAGNOSIS — Z8546 Personal history of malignant neoplasm of prostate: Secondary | ICD-10-CM | POA: Diagnosis not present

## 2020-10-25 DIAGNOSIS — E871 Hypo-osmolality and hyponatremia: Secondary | ICD-10-CM | POA: Diagnosis present

## 2020-10-25 DIAGNOSIS — E1122 Type 2 diabetes mellitus with diabetic chronic kidney disease: Secondary | ICD-10-CM | POA: Diagnosis present

## 2020-10-25 DIAGNOSIS — E1169 Type 2 diabetes mellitus with other specified complication: Secondary | ICD-10-CM | POA: Diagnosis present

## 2020-10-25 DIAGNOSIS — Z9119 Patient's noncompliance with other medical treatment and regimen: Secondary | ICD-10-CM | POA: Diagnosis not present

## 2020-10-25 DIAGNOSIS — Z8673 Personal history of transient ischemic attack (TIA), and cerebral infarction without residual deficits: Secondary | ICD-10-CM | POA: Diagnosis not present

## 2020-10-25 DIAGNOSIS — I25118 Atherosclerotic heart disease of native coronary artery with other forms of angina pectoris: Secondary | ICD-10-CM | POA: Diagnosis present

## 2020-10-25 DIAGNOSIS — F17210 Nicotine dependence, cigarettes, uncomplicated: Secondary | ICD-10-CM | POA: Diagnosis present

## 2020-10-25 DIAGNOSIS — I509 Heart failure, unspecified: Secondary | ICD-10-CM

## 2020-10-25 DIAGNOSIS — M869 Osteomyelitis, unspecified: Secondary | ICD-10-CM | POA: Diagnosis present

## 2020-10-25 DIAGNOSIS — R188 Other ascites: Secondary | ICD-10-CM | POA: Diagnosis present

## 2020-10-25 DIAGNOSIS — I5041 Acute combined systolic (congestive) and diastolic (congestive) heart failure: Secondary | ICD-10-CM

## 2020-10-25 DIAGNOSIS — I4892 Unspecified atrial flutter: Secondary | ICD-10-CM | POA: Diagnosis present

## 2020-10-25 DIAGNOSIS — I130 Hypertensive heart and chronic kidney disease with heart failure and stage 1 through stage 4 chronic kidney disease, or unspecified chronic kidney disease: Secondary | ICD-10-CM | POA: Diagnosis not present

## 2020-10-25 DIAGNOSIS — B182 Chronic viral hepatitis C: Secondary | ICD-10-CM | POA: Diagnosis present

## 2020-10-25 DIAGNOSIS — Z888 Allergy status to other drugs, medicaments and biological substances status: Secondary | ICD-10-CM | POA: Diagnosis not present

## 2020-10-25 DIAGNOSIS — N183 Chronic kidney disease, stage 3 unspecified: Secondary | ICD-10-CM | POA: Diagnosis present

## 2020-10-25 DIAGNOSIS — Z20822 Contact with and (suspected) exposure to covid-19: Secondary | ICD-10-CM | POA: Diagnosis present

## 2020-10-25 DIAGNOSIS — I252 Old myocardial infarction: Secondary | ICD-10-CM | POA: Diagnosis not present

## 2020-10-25 DIAGNOSIS — I428 Other cardiomyopathies: Secondary | ICD-10-CM | POA: Diagnosis present

## 2020-10-25 DIAGNOSIS — I4821 Permanent atrial fibrillation: Secondary | ICD-10-CM | POA: Diagnosis present

## 2020-10-25 DIAGNOSIS — W1830XA Fall on same level, unspecified, initial encounter: Secondary | ICD-10-CM | POA: Diagnosis present

## 2020-10-25 DIAGNOSIS — Z79899 Other long term (current) drug therapy: Secondary | ICD-10-CM | POA: Diagnosis not present

## 2020-10-25 LAB — RESP PANEL BY RT PCR (RSV, FLU A&B, COVID)
Influenza A by PCR: NEGATIVE
Influenza B by PCR: NEGATIVE
Respiratory Syncytial Virus by PCR: NEGATIVE
SARS Coronavirus 2 by RT PCR: NEGATIVE

## 2020-10-25 LAB — BASIC METABOLIC PANEL
Anion gap: 10 (ref 5–15)
Anion gap: 11 (ref 5–15)
Anion gap: 8 (ref 5–15)
BUN: 13 mg/dL (ref 6–20)
BUN: 14 mg/dL (ref 6–20)
BUN: 16 mg/dL (ref 6–20)
CO2: 23 mmol/L (ref 22–32)
CO2: 24 mmol/L (ref 22–32)
CO2: 26 mmol/L (ref 22–32)
Calcium: 8.2 mg/dL — ABNORMAL LOW (ref 8.9–10.3)
Calcium: 8.2 mg/dL — ABNORMAL LOW (ref 8.9–10.3)
Calcium: 8.1 mg/dL — ABNORMAL LOW (ref 8.9–10.3)
Chloride: 92 mmol/L — ABNORMAL LOW (ref 98–111)
Chloride: 92 mmol/L — ABNORMAL LOW (ref 98–111)
Chloride: 95 mmol/L — ABNORMAL LOW (ref 98–111)
Creatinine, Ser: 1.22 mg/dL (ref 0.61–1.24)
Creatinine, Ser: 1.26 mg/dL — ABNORMAL HIGH (ref 0.61–1.24)
Creatinine, Ser: 1.38 mg/dL — ABNORMAL HIGH (ref 0.61–1.24)
GFR, Estimated: >60 mL/min (ref >60)
GFR, Estimated: >60 mL/min (ref >60)
GFR, Estimated: 59 mL/min — ABNORMAL LOW (ref >60)
Glucose, Bld: 114 mg/dL — ABNORMAL HIGH (ref 70–99)
Glucose, Bld: 109 mg/dL — ABNORMAL HIGH (ref 70–99)
Glucose, Bld: 95 mg/dL (ref 70–99)
Potassium: 4.1 mmol/L (ref 3.5–5.1)
Potassium: 4.1 mmol/L (ref 3.5–5.1)
Potassium: 4 mmol/L (ref 3.5–5.1)
Sodium: 125 mmol/L — ABNORMAL LOW (ref 135–145)
Sodium: 127 mmol/L — ABNORMAL LOW (ref 135–145)
Sodium: 129 mmol/L — ABNORMAL LOW (ref 135–145)

## 2020-10-25 LAB — GLUCOSE, CAPILLARY
Glucose-Capillary: 118 mg/dL — ABNORMAL HIGH (ref 70–99)
Glucose-Capillary: 175 mg/dL — ABNORMAL HIGH (ref 70–99)

## 2020-10-25 LAB — CBG MONITORING, ED
Glucose-Capillary: 137 mg/dL — ABNORMAL HIGH (ref 70–99)
Glucose-Capillary: 103 mg/dL — ABNORMAL HIGH (ref 70–99)

## 2020-10-25 LAB — OSMOLALITY: Osmolality: 278 mOsm/kg (ref 275–295)

## 2020-10-25 MED ORDER — DULOXETINE HCL 60 MG PO CPEP
60.0000 mg | ORAL_CAPSULE | Freq: Every day | ORAL | Status: DC
  Administered 2020-10-25 – 2020-10-27 (×3): 60 mg via ORAL
  Filled 2020-10-25 (×3): qty 1

## 2020-10-25 MED ORDER — LORAZEPAM 2 MG/ML IJ SOLN
0.0000 mg | Freq: Four times a day (QID) | INTRAMUSCULAR | Status: AC
  Administered 2020-10-26: 2 mg via INTRAVENOUS
  Administered 2020-10-26 (×2): 1 mg via INTRAVENOUS
  Filled 2020-10-25 (×3): qty 1

## 2020-10-25 MED ORDER — AMIODARONE HCL 200 MG PO TABS
200.0000 mg | ORAL_TABLET | Freq: Every day | ORAL | Status: DC
  Administered 2020-10-25 – 2020-10-27 (×3): 200 mg via ORAL
  Filled 2020-10-25 (×3): qty 1

## 2020-10-25 MED ORDER — SODIUM CHLORIDE 0.9 % IV SOLN
2.0000 g | Freq: Three times a day (TID) | INTRAVENOUS | Status: DC
  Administered 2020-10-25: 2 g via INTRAVENOUS
  Filled 2020-10-25: qty 2

## 2020-10-25 MED ORDER — LORAZEPAM 1 MG PO TABS
1.0000 mg | ORAL_TABLET | ORAL | Status: DC | PRN
  Administered 2020-10-26 – 2020-10-27 (×4): 1 mg via ORAL
  Filled 2020-10-25 (×4): qty 1

## 2020-10-25 MED ORDER — FOLIC ACID 1 MG PO TABS
1.0000 mg | ORAL_TABLET | Freq: Every day | ORAL | Status: DC
  Administered 2020-10-25 – 2020-10-27 (×3): 1 mg via ORAL
  Filled 2020-10-25 (×3): qty 1

## 2020-10-25 MED ORDER — HEPARIN SODIUM (PORCINE) 5000 UNIT/ML IJ SOLN
5000.0000 [IU] | Freq: Three times a day (TID) | INTRAMUSCULAR | Status: DC
  Administered 2020-10-25 – 2020-10-27 (×6): 5000 [IU] via SUBCUTANEOUS
  Filled 2020-10-25 (×5): qty 1

## 2020-10-25 MED ORDER — PANTOPRAZOLE SODIUM 40 MG PO TBEC
40.0000 mg | DELAYED_RELEASE_TABLET | Freq: Every day | ORAL | Status: DC
  Administered 2020-10-25 – 2020-10-27 (×3): 40 mg via ORAL
  Filled 2020-10-25 (×3): qty 1

## 2020-10-25 MED ORDER — VANCOMYCIN HCL IN DEXTROSE 1-5 GM/200ML-% IV SOLN
1000.0000 mg | Freq: Two times a day (BID) | INTRAVENOUS | Status: DC
  Administered 2020-10-25: 1000 mg via INTRAVENOUS
  Filled 2020-10-25: qty 200

## 2020-10-25 MED ORDER — GABAPENTIN 300 MG PO CAPS
600.0000 mg | ORAL_CAPSULE | Freq: Every day | ORAL | Status: DC
  Administered 2020-10-25 – 2020-10-27 (×3): 600 mg via ORAL
  Filled 2020-10-25 (×3): qty 2

## 2020-10-25 MED ORDER — FUROSEMIDE 10 MG/ML IJ SOLN
40.0000 mg | Freq: Two times a day (BID) | INTRAMUSCULAR | Status: DC
  Administered 2020-10-25 – 2020-10-27 (×5): 40 mg via INTRAVENOUS
  Filled 2020-10-25 (×5): qty 4

## 2020-10-25 MED ORDER — LORAZEPAM 2 MG/ML IJ SOLN: 0.0000 mg | Freq: Two times a day (BID) | INTRAMUSCULAR | Status: DC

## 2020-10-25 MED ORDER — POTASSIUM CHLORIDE CRYS ER 20 MEQ PO TBCR
20.0000 meq | EXTENDED_RELEASE_TABLET | Freq: Every day | ORAL | Status: DC
  Administered 2020-10-25 – 2020-10-27 (×3): 20 meq via ORAL
  Filled 2020-10-25 (×3): qty 1

## 2020-10-25 MED ORDER — GABAPENTIN 300 MG PO CAPS
900.0000 mg | ORAL_CAPSULE | Freq: Every day | ORAL | Status: DC
  Administered 2020-10-25 – 2020-10-26 (×2): 900 mg via ORAL
  Filled 2020-10-25 (×2): qty 3

## 2020-10-25 MED ORDER — ISOSORB DINITRATE-HYDRALAZINE 20-37.5 MG PO TABS
1.0000 | ORAL_TABLET | Freq: Three times a day (TID) | ORAL | Status: DC
  Administered 2020-10-25 – 2020-10-27 (×7): 1 via ORAL
  Filled 2020-10-25 (×9): qty 1

## 2020-10-25 MED ORDER — LORAZEPAM 2 MG/ML IJ SOLN
1.0000 mg | INTRAMUSCULAR | Status: DC | PRN
  Administered 2020-10-25 – 2020-10-26 (×3): 1 mg via INTRAVENOUS
  Administered 2020-10-26: 2 mg via INTRAVENOUS
  Filled 2020-10-25 (×5): qty 1

## 2020-10-25 MED ORDER — ATORVASTATIN CALCIUM 10 MG PO TABS
20.0000 mg | ORAL_TABLET | Freq: Every day | ORAL | Status: DC
  Administered 2020-10-25 – 2020-10-26 (×2): 20 mg via ORAL
  Filled 2020-10-25 (×2): qty 2

## 2020-10-25 MED ORDER — ADULT MULTIVITAMIN W/MINERALS CH
1.0000 | ORAL_TABLET | Freq: Every day | ORAL | Status: DC
  Administered 2020-10-25 – 2020-10-27 (×3): 1 via ORAL
  Filled 2020-10-25 (×3): qty 1

## 2020-10-25 MED ORDER — THIAMINE HCL 100 MG PO TABS
100.0000 mg | ORAL_TABLET | Freq: Every day | ORAL | Status: DC
  Administered 2020-10-25 – 2020-10-27 (×3): 100 mg via ORAL
  Filled 2020-10-25 (×3): qty 1

## 2020-10-25 MED ORDER — HYDROMORPHONE HCL 1 MG/ML IJ SOLN
0.5000 mg | Freq: Once | INTRAMUSCULAR | Status: AC
  Administered 2020-10-25: 0.5 mg via INTRAVENOUS
  Filled 2020-10-25: qty 1

## 2020-10-25 MED ORDER — GABAPENTIN 300 MG PO CAPS: 600.0000 mg | ORAL_CAPSULE | ORAL | Status: DC

## 2020-10-25 MED ORDER — THIAMINE HCL 100 MG/ML IJ SOLN: 100.0000 mg | Freq: Every day | INTRAMUSCULAR | Status: DC

## 2020-10-25 MED ORDER — INSULIN ASPART 100 UNIT/ML ~~LOC~~ SOLN
0.0000 [IU] | Freq: Three times a day (TID) | SUBCUTANEOUS | Status: DC
  Administered 2020-10-25: 1 [IU] via SUBCUTANEOUS
  Administered 2020-10-26: 2 [IU] via SUBCUTANEOUS
  Administered 2020-10-26: 1 [IU] via SUBCUTANEOUS
  Administered 2020-10-27: 2 [IU] via SUBCUTANEOUS

## 2020-10-25 MED ORDER — DICYCLOMINE HCL 10 MG/ML IM SOLN
20.0000 mg | Freq: Once | INTRAMUSCULAR | Status: AC
  Administered 2020-10-25: 20 mg via INTRAMUSCULAR
  Filled 2020-10-25: qty 2

## 2020-10-25 NOTE — Progress Notes (Signed)
Pharmacy Antibiotic Note  Brad [name] is a 59 y.o. male admitted on 10/24/2020 with ?sternal osteomyelitis .  Pharmacy has been consulted for Vancomycin/Cefepime dosing. WBC 4, renal function ok.   Plan: Vancomycin 1000 mg IV q12h Cefepime 2g IV q8h Trend WBC, temp, renal function  F/U infectious work-up Drug levels as indicated  Temp (24hrs), Avg:97.6 F (36.4 C), Min:97.4 F (36.3 C), Max:97.7 F (36.5 C)  Recent Labs  Lab 10/18/20 0606 10/18/20 1028 10/19/20 0341 10/19/20 0341 10/20/20 1130 10/21/20 0940 10/21/20 1649 10/22/20 0540 10/24/20 1850 10/25/20 0252  WBC  --   --  2.5*  --  3.3* 3.9*  --  4.2 4.0  --   CREATININE 1.09   < > 1.37*   < > 1.37* 1.35* 1.35*  --  1.12 1.22  LATICACIDVEN 1.2  --   --   --   --   --   --   --   --   --    < > = values in this interval not displayed.    Estimated Creatinine Clearance: 68.6 mL/min (by C-G formula based on SCr of 1.22 mg/dL).    Allergies  Allergen Reactions  . Angiotensin Receptor Blockers Anaphylaxis and Other (See Comments)    (Angioedema also with Lisinopril, therefore ARB's are contraindicated)  . Lisinopril Anaphylaxis and Swelling    Throat swelling  . Pamelor [Nortriptyline Hcl] Anaphylaxis and Swelling    Throat swells    Narda Bonds, PharmD, BCPS Clinical Pharmacist Phone: [phone]

## 2020-10-25 NOTE — ED Provider Notes (Signed)
00:30: Assumed care of patient from [facility] PA-C at change of shift pending consultation for admission.   Briefly patient is a 59 yo male who presents to the ED for chest pain/abdominal pain.  59 year old male plan for admission for CHF & possibly osteomyelitis of his sternum.   Please see prior provider note for full H&P.  Abx started & lasix given by prior team.   00:46: CONSULT: Discussed with hospitalist Dr. Hal Hope- accepts admission.       Leafy Kindle 10/25/20 0050    Palumbo, April, MD 10/25/20 [phone]

## 2020-10-25 NOTE — ED Notes (Signed)
Pt sitting on side of bed, complaining of 10/10 abdominal pain. No PRN pain medications ordered, Provider paged.   respirations are even and non-labored. Pt is grimacing and appears uncomfortable.  Skin is warm, dry and intact

## 2020-10-25 NOTE — ED Notes (Signed)
Lunch Tray Ordered @ 1016. 

## 2020-10-25 NOTE — H&P (Addendum)
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 lesion. Multilevel degenerative changes are present in the imaged portions of the spine. Mild degenerative changes in the hips and pelvis. Review of the MIP images confirms the above findings. IMPRESSION: Vascular: 1. No evidence of acute aortic syndrome. 2. Features of congestive heart failure with cardiomegaly and predominantly biatrial enlargement and three-vessel coronary artery atherosclerosis. Reflux of contrast into the IVC and hepatic veins may suggest right-sided heart elevated right  heart pressures in the setting of fluid overload. 3.  Aortic Atherosclerosis (ICD10-I70.0). 4. Multilevel mild splanchnic and renal artery ostial narrowing with more mild narrowing at the IMA origin as well. 5. 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 Nonvascular: 1. Stigmata of cirrhosis with a heterogeneous, nodular liver. 2. Additional features of anasarca with diffuse body wall edema, small volume ascites, and mesenteric edematous changes. 3. Fairly focal phlegmonous change along the anterior chest wall near the sternomanubrial margin with mild retrosternal stranding as well. No organized rim enhancing collection or abscess nor destructive changes of the sternomanubrial joint at this time. Findings are nonspecific though could reflect early infection/sternomanubrial osteomyelitis particularly setting of substance use. 4. Slightly more focal retroperitoneal  edematous changes appear centered upon the duodenal sweep though may be redistributed. Could correlate for clinical features of duodenitis. 5. Circumferential bladder wall thickening with prostatomegaly, possibly related to chronic outlet obstruction. Recommend assessment of urinary symptoms with urinalysis as clinically warranted to exclude a cystitis. 6. Gallbladder containing some layering intermediate attenuation may reflect biliary sludge or sand but without convincing CT features of acute cholecystitis. Electronically Signed: By: Lovena Le M.D. On: 10/24/2020 23:26    EKG: Independently reviewed.  A. fib rate controlled.  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 Acute on chronic systolic and diastolic CHF last EF measured in August 2021 was 30 to 35%.  Presently on Lasix 40 mg IV every 12 along with patient is also on BiDil.  Closely follow intake output metabolic panel and daily weights. 2. Possible sternal osteomyelitis for which blood cultures were sent and started empiric antibiotics.  May need infectious disease input in the morning. 3. Alcohol abuse with history of cirrhosis of the liver CT scan does show ascites.  Likely from decompensated liver cirrhosis and CHF.  On Lasix at this time.  Patient is also on empiric antibiotics.  May need paracentesis to rule out SBP in the morning. 4. History of A. fib not on anticoagulation due to risk of falls with history of alcoholism.  On amiodarone.  Rate controlled. 5. Diabetes mellitus type 2 we will keep patient on sliding scale coverage. 6. Chronic anemia follow CBC. 7. Severe hyponatremia likely from fluid overload.  Follow metabolic panel closely.  I think will improve with fluid restriction and Lasix. 8. Polysubstance abuse including alcohol and cocaine but advised about quitting.  Patient is on CIWA. 9. CT scan of the abdomen also shows narrowing of the IMA renal arteries and splanchnic vessels.  Will need follow-up as outpatient.  Since patient is having acute CHF with severe hyponatremia abdominal discomfort ascites decompensated cirrhosis will need close monitoring for any further worsening in inpatient status.   DVT prophylaxis: Heparin. Code Status: Full code.   Family Communication: Discussed with patient. Disposition Plan: Home. Consults called: None. Admission status: Inpatient.   Rise Patience MD [facility] Pager [phone].  If 7PM-7AM, please contact night-coverage [email or web address] Password [facility]  10/25/2020, 2:39 AM

## 2020-10-25 NOTE — Progress Notes (Signed)
Heart Failure Stewardship Pharmacist Progress Note   PCP: Charlott Rakes, MD PCP-Cardiologist: [name], MD    HPI:  59 yo M with PMH of T2DM, HTN, HLD, afib, CHF, CKD III, polysubstance abuse, hep C, alcoholic liver cirrhosis, depression, and GERD. Recently admitted from 10/17/20 to 10/22/20 for HF exacerbation. His last ECHO was done 07/2020 and LVEF was 30-35%.  Patient presents back to ED on 10/24/20 with chest pain and abdominal pain, complicated by alcohol and cocaine use. Admitted with infectious workup for potential osteomyelitis.   Current HF Medications: Furosemide 40 mg IV BID BiDil 20/37.5 mg TID Potassium chloride 20 mew daily (oral)  Prior to admission HF Medications: Furosemide 40 mg BID BiDil 20/37.5 mg TID Farxiga 10 mg daily *Potassium chloride 20 meq daily  *Sodium supplementation  Pertinent Lab Values: . Serum creatinine 1.38, BUN 16, Potassium 4.0, Sodium 129, BNP 834.0, Magnesium -- . I&O = 62mL last night, 443mL today   Vital Signs: . Weight: n/a . Blood pressure: 110s/60s-70s . Heart rate: 70s  Medication Assistance / Insurance Benefits Check: Does the patient have prescription insurance?  Yes Type of insurance plan: [place] [employer]  Outpatient Pharmacy:  Prior to admission outpatient pharmacy: [facility] Is the patient willing to use [facility] at discharge? Yes Is the patient willing to transition their outpatient pharmacy to utilize a [facility] outpatient pharmacy?   Pending    Assessment: 1. Acute on chronic systolic CHF (EF 29-79%), probably due to substance abuse (ETOH, cocaine use). NYHA class II/III symptoms. - Continue on IV furosemide. Potassium supplement active. Holding off on home sodium replacement as Na is trending upward.  - Was on carvedilol in the past but this was stopped due to bradycardia. Consider initiating the preferred beta blocker carvedilol (unopposed alpha) for HF in the setting of ongoing cocaine  use. - Continue BiDil 20/37.5 mg TID - Farxiga currently on hold; consider restarting with 10 mg dose as infectious workup comes back.  - ACE/ARB/ARNI is not a safe option for this patient d/t angioedema history     Plan: 1) Medication changes recommended at this time: - Continue Furosemide - Consider adding back Farxiga once infectious workup is completed - Consider starting preferred beta-blocker, carvedilol, once euvolemic   2)  Education  - To be completed prior to discharge  Kerby Nora, PharmD, BCPS Heart Failure Stewardship Pharmacist Phone [phone]

## 2020-10-25 NOTE — Progress Notes (Signed)
PROGRESS NOTE    Brad [name]  OJJ:[id number] DOB: Jun 20, 1961 DOA: 10/24/2020 PCP: Charlott Rakes, MD   Brief Narrative:  Brad [name] is a 59 y.o. male with history of alcohol abuse and cocaine abuse with history of chronic combined CHF, diabetes mellitus who was just discharged about 4 days ago after being admitted for CHF presents to the ER with complaints of increasing abdominal discomfort nausea vomiting and shortness of breath or chest pain.  Patient states he has been compliant with his medication as advised.  Admits to drinking alcohol every day.  Denies any blood in the vomitus and also has been some diarrhea which was watery. In ED - given the significant pain patient underwent CT angiogram of the chest abdomen pelvis to rule out dissection.  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  In addition it showed ascites with narrowing of the IMA renal artery and the splanchnic vessels.  Patient was started on Lasix empiric antibiotics and admitted for further management.  Labs are significant for hemoglobin of 12.4 sodium was 122.  EKG showing A. fib rate controlled.  BNP was 834 Covid test was negative.   Assessment & Plan:   Active Problems:   Polysubstance abuse ([place])   DM type 2 (diabetes mellitus, type 2) (HCC)   CKD (chronic kidney disease) stage 3, GFR 30-59 ml/min (HCC)   CAD (coronary artery disease), native coronary artery   Permanent atrial fibrillation   Acute exacerbation of CHF (congestive heart failure) (HCC)   Hyponatremia with normal extracellular fluid volume   Acute CHF (congestive heart failure) (HCC)   Osteomyelitis of sternum (HCC)  Acute on chronic systolic and diastolic CHF in acute exacerbation, POA - last EF measured in August 2021 was 30 to 35%.   -Continue Lasix 40 mg IV every 12 along with patient is also on BiDil.   -Follow I's and O's, daily weights, strict  low-salt fluid restricted diet  Rule out sternal osteomyelitis -Patient remains without fever, leukocytosis or reproducible chest pain -Discontinue antibiotics given patient does not meet sepsis criteria and given only questionable infection based on imaging without complaints  Illicit substance abuse, cocaine and alcohol abuse with known cirrhosis and ascites  -Patient admits to ongoing alcohol and cocaine abuse but states," I am going to stop" -We discussed the need for both dietary, lifestyle and medication compliance in the setting of worsening heart failure exacerbation as above  -Patient diuresing well, hold off on paracentesis at this time -Continue CIWA protocol for known alcohol use/abuse  History of A. fib not on anticoagulation due to risk of falls with history of alcoholism.  -Patient currently on amiodarone only, rate moderately well controlled at this time  Diabetes mellitus type 2 -Continue sliding scale insulin, hypoglycemic protocol Lab Results  Component Value Date   HGBA1C 6.6 (H) 10/18/2020   Chronic anemia of chronic disease, stable at baseline  Hypervolemic hyponatremia in the setting of above  Continue to diuresis, sodium titrating upwards appropriately, continue to follow to ensure no overcorrection or rapid correction   Incidentally noted narrowing of the IMA renal arteries and splanchnic vessels. -Currently no indication for intervention, follow-up outpatient imaging per protocol   DVT prophylaxis: Heparin. Code Status: Full code.   Family Communication: None present  Status is: Inpatient  Dispo: The patient is from: Home              Anticipated d/c is to: Home  Anticipated d/c date is: 48 to 72 hours pending clinical course              Patient currently not medically stable for discharge given need for ongoing close monitoring, IV diuresis and possible need for further intervention or testing in the setting of abdominal ascites, alcohol  use/abuse and cocaine use concerning for possible withdrawal in the next 24 to 48 hours.  Consultants:   None  Procedures:   None planned  Antimicrobials:  Vancomycin, cefepime x1 dose 10/24/2020, discontinued  Subjective: No acute issues or events overnight, patient continues to complain of leg and back pain that appears to be chronic but otherwise denies chest pain, shortness of breath, nausea, vomiting, diarrhea, constipation, headache, fevers, chills.  Objective: Vitals:   10/25/20 0430 10/25/20 0500 10/25/20 0600 10/25/20 0700  BP: (!) 143/96 131/89 101/67 125/66  Pulse: (!) 104 77 60 67  Resp: 17 17 (!) 23 11  Temp:      TempSrc:      SpO2: 97% 93% 96% 98%    Intake/Output Summary (Last 24 hours) at 10/25/2020 0757 Last data filed at 10/25/2020 0401 Gross per 24 hour  Intake 400 ml  Output 650 ml  Net -250 ml   There were no vitals filed for this visit.  Examination:  General:  Pleasantly resting in bed, No acute distress. HEENT:  Normocephalic atraumatic.  Sclerae nonicteric, noninjected.  Extraocular movements intact bilaterally. Neck:  Without mass or deformity.  Trachea is midline. Lungs:  Clear to auscultate bilaterally without rhonchi, wheeze, or rales. Heart:  Regular rate and rhythm.  Without murmurs, rubs, or gallops. Abdomen:  Soft, nontender, nondistended.  Without guarding or rebound. Extremities: Without cyanosis, clubbing, or obvious deformity.  2+ pitting edema to lower extremities bilaterally Vascular:  Dorsalis pedis and posterior tibial pulses palpable bilaterally. Skin:  Warm and dry, no erythema, no ulcerations.   Data Reviewed: I have personally reviewed following labs and imaging studies  CBC: Recent Labs  Lab 10/19/20 0341 10/20/20 1130 10/21/20 0940 10/22/20 0540 10/24/20 1850  WBC 2.5* 3.3* 3.9* 4.2 4.0  NEUTROABS 1.5* 2.0 2.5 3.2  --   HGB 9.7* 9.9* 11.4* 10.3* 12.4*  HCT 28.9* 29.8* 34.9* 31.8* 38.4*  MCV 84.3 85.4 87.7 89.1  89.3  PLT 213 219 209 217 678   Basic Metabolic Panel: Recent Labs  Lab 10/19/20 0341 10/19/20 0341 10/20/20 1130 10/20/20 1130 10/21/20 0940 10/21/20 1649 10/22/20 0540 10/24/20 1850 10/25/20 0252 10/25/20 0633  NA 127*   < > 129*   < > 129* 130*  --  122* 125* 127*  K 3.4*   < > 3.9   < > 4.9 4.9  --  4.4 4.1 4.1  CL 94*   < > 97*   < > 98 99  --  92* 92* 92*  CO2 23   < > 25   < > 22 23  --  19* 23 24  GLUCOSE 116*   < > 123*   < > 119* 139*  --  125* 114* 109*  BUN 12   < > 13   < > 12 13  --  13 13 14   CREATININE 1.37*   < > 1.37*   < > 1.35* 1.35*  --  1.12 1.22 1.26*  CALCIUM 7.6*   < > 7.9*   < > 8.4* 8.2*  --  8.2* 8.2* 8.2*  MG 1.5*  --  2.0  --   --   --  1.8  --   --   --   PHOS 3.0  --   --   --   --   --   --   --   --   --    < > = values in this interval not displayed.   GFR: Estimated Creatinine Clearance: 66.4 mL/min (A) (by C-G formula based on SCr of 1.26 mg/dL (H)). Liver Function Tests: No results for input(s): AST, ALT, ALKPHOS, BILITOT, PROT, ALBUMIN in the last 168 hours. No results for input(s): LIPASE, AMYLASE in the last 168 hours. Recent Labs  Lab 10/19/20 0341  AMMONIA 29   Coagulation Profile: No results for input(s): INR, PROTIME in the last 168 hours. Cardiac Enzymes: No results for input(s): CKTOTAL, CKMB, CKMBINDEX, TROPONINI in the last 168 hours. BNP (last 3 results) No results for input(s): PROBNP in the last 8760 hours. HbA1C: No results for input(s): HGBA1C in the last 72 hours. CBG: Recent Labs  Lab 10/21/20 1130 10/21/20 1513 10/21/20 2118 10/22/20 0558 10/22/20 1611  GLUCAP 104* 139* 139* 131* 145*   Lipid Profile: No results for input(s): CHOL, HDL, LDLCALC, TRIG, CHOLHDL, LDLDIRECT in the last 72 hours. Thyroid Function Tests: No results for input(s): TSH, T4TOTAL, FREET4, T3FREE, THYROIDAB in the last 72 hours. Anemia Panel: No results for input(s): VITAMINB12, FOLATE, FERRITIN, TIBC, IRON, RETICCTPCT in the  last 72 hours. Sepsis Labs: No results for input(s): PROCALCITON, LATICACIDVEN in the last 168 hours.  Recent Results (from the past 240 hour(s))  Resp Panel by RT PCR (RSV, Flu A&B, Covid) - Nasopharyngeal Swab     Status: None   Collection Time: 10/17/20  8:26 PM   Specimen: Nasopharyngeal Swab  Result Value Ref Range Status   SARS Coronavirus 2 by RT PCR NEGATIVE NEGATIVE Final    Comment: (NOTE) SARS-CoV-2 target nucleic acids are NOT DETECTED.  The SARS-CoV-2 RNA is generally detectable in upper respiratoy specimens during the acute phase of infection. The lowest concentration of SARS-CoV-2 viral copies this assay can detect is 131 copies/mL. A negative result does not preclude SARS-Cov-2 infection and should not be used as the sole basis for treatment or other patient management decisions. A negative result may occur with  improper specimen collection/handling, submission of specimen other than nasopharyngeal swab, presence of viral mutation(s) within the areas targeted by this assay, and inadequate number of viral copies (<131 copies/mL). A negative result must be combined with clinical observations, patient history, and epidemiological information. The expected result is Negative.  Fact Sheet for Patients:  PinkCheek.be  Fact Sheet for Healthcare Providers:  GravelBags.it  This test is no t yet approved or cleared by the [place] FDA and  has been authorized for detection and/or diagnosis of SARS-CoV-2 by FDA under an Emergency Use Authorization (EUA). This EUA will remain  in effect (meaning this test can be used) for the duration of the COVID-19 declaration under Section 564(b)(1) of the Act, 21 U.S.C. section 360bbb-3(b)(1), unless the authorization is terminated or revoked sooner.     Influenza A by PCR NEGATIVE NEGATIVE Final   Influenza B by PCR NEGATIVE NEGATIVE Final    Comment: (NOTE) The Xpert  Xpress SARS-CoV-2/FLU/RSV assay is intended as an aid in  the diagnosis of influenza from Nasopharyngeal swab specimens and  should not be used as a sole basis for treatment. Nasal washings and  aspirates are unacceptable for Xpert Xpress SARS-CoV-2/FLU/RSV  testing.  Fact Sheet for Patients: PinkCheek.be  Fact Sheet for Healthcare  Providers: GravelBags.it  This test is not yet approved or cleared by the [place] and  has been authorized for detection and/or diagnosis of SARS-CoV-2 by  FDA under an Emergency Use Authorization (EUA). This EUA will remain  in effect (meaning this test can be used) for the duration of the  Covid-19 declaration under Section 564(b)(1) of the Act, 21  U.S.C. section 360bbb-3(b)(1), unless the authorization is  terminated or revoked.    Respiratory Syncytial Virus by PCR NEGATIVE NEGATIVE Final    Comment: (NOTE) Fact Sheet for Patients: PinkCheek.be  Fact Sheet for Healthcare Providers: GravelBags.it  This test is not yet approved or cleared by the [place] FDA and  has been authorized for detection and/or diagnosis of SARS-CoV-2 by  FDA under an Emergency Use Authorization (EUA). This EUA will remain  in effect (meaning this test can be used) for the duration of the  COVID-19 declaration under Section 564(b)(1) of the Act, 21 U.S.C.  section 360bbb-3(b)(1), unless the authorization is terminated or  revoked. Performed at [facility], [place] [address] [place], [place] [address]   Resp Panel by RT PCR (RSV, Flu A&B, Covid) - Nasopharyngeal Swab     Status: None   Collection Time: 10/25/20 12:10 AM   Specimen: Nasopharyngeal Swab  Result Value Ref Range Status   SARS Coronavirus 2 by RT PCR NEGATIVE NEGATIVE Final    Comment: (NOTE) SARS-CoV-2 target nucleic acids are NOT DETECTED.  The SARS-CoV-2 RNA is  generally detectable in upper respiratoy specimens during the acute phase of infection. The lowest concentration of SARS-CoV-2 viral copies this assay can detect is 131 copies/mL. A negative result does not preclude SARS-Cov-2 infection and should not be used as the sole basis for treatment or other patient management decisions. A negative result may occur with  improper specimen collection/handling, submission of specimen other than nasopharyngeal swab, presence of viral mutation(s) within the areas targeted by this assay, and inadequate number of viral copies (<131 copies/mL). A negative result must be combined with clinical observations, patient history, and epidemiological information. The expected result is Negative.  Fact Sheet for Patients:  PinkCheek.be  Fact Sheet for Healthcare Providers:  GravelBags.it  This test is no t yet approved or cleared by the [place] FDA and  has been authorized for detection and/or diagnosis of SARS-CoV-2 by FDA under an Emergency Use Authorization (EUA). This EUA will remain  in effect (meaning this test can be used) for the duration of the COVID-19 declaration under Section 564(b)(1) of the Act, 21 U.S.C. section 360bbb-3(b)(1), unless the authorization is terminated or revoked sooner.     Influenza A by PCR NEGATIVE NEGATIVE Final   Influenza B by PCR NEGATIVE NEGATIVE Final    Comment: (NOTE) The Xpert Xpress SARS-CoV-2/FLU/RSV assay is intended as an aid in  the diagnosis of influenza from Nasopharyngeal swab specimens and  should not be used as a sole basis for treatment. Nasal washings and  aspirates are unacceptable for Xpert Xpress SARS-CoV-2/FLU/RSV  testing.  Fact Sheet for Patients: PinkCheek.be  Fact Sheet for Healthcare Providers: GravelBags.it  This test is not yet approved or cleared by the [place] FDA and  has been authorized for detection and/or diagnosis of SARS-CoV-2 by  FDA under an Emergency Use Authorization (EUA). This EUA will remain  in effect (meaning this test can be used) for the duration of the  Covid-19 declaration under Section 564(b)(1) of the Act, 21  U.S.C. section 360bbb-3(b)(1),  unless the authorization is  terminated or revoked.    Respiratory Syncytial Virus by PCR NEGATIVE NEGATIVE Final    Comment: (NOTE) Fact Sheet for Patients: PinkCheek.be  Fact Sheet for Healthcare Providers: GravelBags.it  This test is not yet approved or cleared by the [place] FDA and  has been authorized for detection and/or diagnosis of SARS-CoV-2 by  FDA under an Emergency Use Authorization (EUA). This EUA will remain  in effect (meaning this test can be used) for the duration of the  COVID-19 declaration under Section 564(b)(1) of the Act, 21 U.S.C.  section 360bbb-3(b)(1), unless the authorization is terminated or  revoked. Performed at [facility], [place] [address] [place], [place] [address]     Radiology Studies: DG Chest 2 View  Result Date: 10/24/2020 CLINICAL DATA:  Chest pain EXAM: CHEST - 2 VIEW COMPARISON:  10/08/2020 FINDINGS: Cardiomegaly, vascular congestion. Bibasilar atelectasis. No effusions or overt edema. No acute bony abnormality. IMPRESSION: Cardiomegaly with vascular congestion. Bibasilar atelectasis. Electronically Signed   By: Rolm Baptise M.D.   On: 10/24/2020 19:16   CT Angio Chest/Abd/Pel for Dissection W and/or W/WO  Addendum Date: 10/24/2020   ADDENDUM REPORT: 10/24/2020 23:40 ADDENDUM: Results and case discussion with the ordering provider via telephone on 10/24/2020 at 11:40 pm to provider Sherol Dade , who verbally acknowledged these results. Electronically Signed   By: Lovena Le M.D.   On: 10/24/2020 23:40   Result Date: 10/24/2020 CLINICAL DATA:  Chest and  abdominal pain, concern for dissection, history of substance abuse, advanced cardiac history, CKD and hepatitis C EXAM: CT ANGIOGRAPHY CHEST, ABDOMEN AND PELVIS TECHNIQUE: Non-contrast CT of the chest was initially obtained. Multidetector CT imaging through the chest, abdomen and pelvis was performed using the standard protocol during bolus administration of intravenous contrast. Multiplanar reconstructed images and MIPs were obtained and reviewed to evaluate the vascular anatomy. CONTRAST:  179mL OMNIPAQUE IOHEXOL 350 MG/ML SOLN COMPARISON:  CT angiography 06/21/2020, chest radiograph 10/24/2020, CT abdomen and pelvis 10/18/2020 FINDINGS: CTA CHEST FINDINGS Cardiovascular: Noncontrast CT of the chest was performed initially. There is a normal caliber aorta with scattered atherosclerotic calcifications. No abnormal hyperdense mural thickening or plaque displacement is seen to suggest intramural hematoma. Postcontrast administration, there is satisfactory preferential opacification of the thoracic aorta. No acute luminal abnormality of the imaged aorta. No periaortic stranding or hemorrhage. Normal 3 vessel branching of the aortic arch. Proximal great vessels are mildly calcified but otherwise unremarkable without acute luminal abnormality. Central pulmonary arteries are normal caliber. No large central or lobar filling defects present within the limitations this non tailored evaluation of the pulmonary arteries. There is mild cardiomegaly with predominantly biatrial enlargement and three-vessel coronary artery atherosclerosis. No pericardial effusion. Small volume of fluid in the pericardial recesses is upper limits of physiologic normal. Mediastinum/Nodes: Minimal retrosternal thickening along the left posterolateral margin of the sternomanubrial joint. No other mediastinal fluid or gas. Normal thyroid gland and thoracic inlet. No acute abnormality of the trachea or esophagus. No worrisome mediastinal, hilar or  axillary adenopathy. Scattered subcentimeter low-attenuation mediastinal nodes may be edematous or reactive. Lungs/Pleura: Redistribution of pulmonary vascularity with vascular cuffing and interlobular septal thickening most pronounced towards the lung apices and bilateral bases suggesting at least mild interstitial edema. Moderate right and small left pleural effusions with adjacent areas of passive atelectasis. Additional areas of bandlike opacity likely reflecting further subsegmental atelectasis or scarring. Some more dependent ground-glass favors additional atelectasis as well. No focal consolidative opacity. No pneumothorax  Musculoskeletal: Mild dextrocurvature of the spine, possibly positional. There is fairly focal phlegmonous change along the anterior chest wall near the sternomanubrial margin albeit without organized rim enhancing collection or abscess nor adjacent erosive or destructive changes of the sternum joint at this time. Some multilevel discogenic changes are present without features of acute osteomyelitis or discitis. Some asymmetric degenerative changes of the medial head clavicles are similar to prior. Additional mild degenerative changes in both shoulders as imaged. Review of the MIP images confirms the above findings. CTA ABDOMEN AND PELVIS FINDINGS VASCULAR Aorta: Atherosclerotic plaque within the normal caliber aorta. No aneurysm or ectasia. No acute luminal abnormality. No periaortic stranding or hemorrhage. Celiac: Mild ostial plaque narrowing with some poststenotic dilatation of the proximal celiac to 12 mm in diameter (7/150), unchanged from comparison. No other significant narrowing, dilatation or ectasia is seen. SMA: Mild ostial plaque narrowing without aneurysm, 10 Jia, stenosis or occlusion. No acute luminal abnormality is seen. Renals: Single renal arteries bilaterally. Ostial plaque bilaterally with mild narrowing, right slightly greater than left. No other significant stenosis  or occlusion. No acute luminal abnormality. No aneurysm or ectasia or features of fibromuscular dysplasia. IMA: Moderate ostial plaque narrowing. Otherwise normally opacified. Normal branching without acute luminal abnormality or occlusion. Inflow: Atherosclerotic calcification in the common, internal external iliac artery branches without stenosis, occlusion or acute luminal abnormality. Proximal outflow vessels including the common superficial and deep femoral arteries are mildly calcified but without significant stenosis, occlusion or acute luminal abnormality. Veins: Reflux of contrast into the IVC and hepatic veins. No other major venous abnormality within limitations of this arterial phase exam. Review of the MIP images confirms the above findings. NON-VASCULAR Hepatobiliary: Hepatomegaly with a nodular liver surface contour. No concerning focal liver lesion. Gallbladder containing some layering intermediate attenuation may reflect biliary sludge or sand. No pericholecystic fluid or inflammation. No intraductal gallstones. No biliary ductal dilatation. Pancreas: No pancreatic ductal dilatation or surrounding inflammatory changes. Spleen: Normal in size. No concerning splenic lesions. Adrenals/Urinary Tract: Normal adrenal glands. Stable mild symmetric bilateral perinephric stranding, a nonspecific finding particularly given some circumferential bladder wall thickening. Fluid attenuation cyst present in the lower pole left kidney is similar to comparison. No concerning renal mass. Urolithiasis or hydronephrosis. No visible bladder calculi or debris. Stomach/Bowel: Distal esophagus and stomach are unremarkable. There are some mild edematous retroperitoneal changes centered upon the second and third portions of the duodenal sweep as across the midline abdomen. Wall thickening is difficult to assess given underdistention and lack of enteric contrast media. No other small bowel thickening or stranding is seen. A  normal appendix is visualized. No colonic dilatation or wall thickening. Rectal stool ball without significant wall thickening. Presacral fat stranding is nonspecific given additional edematous features elsewhere. Lymphatic: No suspicious or enlarged lymph nodes in the included lymphatic chains. Reproductive: Prostatomegaly. Prostate seminal vesicles are otherwise unremarkable. Other: There is diffuse body wall edema. There are nonspecific retroperitoneal edematous changes, which is questionably centered upon the duodenal sweep though is distributed within the pararenal spaces and more inferiorly as well. Small volume of free intraperitoneal fluid is also present layering within the pericolic gutters and presacral space as well. No free air. No bowel containing hernias. Musculoskeletal: No acute osseous abnormality or suspicious osseous lesion. Multilevel degenerative changes are present in the imaged portions of the spine. Mild degenerative changes in the hips and pelvis. Review of the MIP images confirms the above findings. IMPRESSION: Vascular: 1. No evidence of acute aortic syndrome. 2.  Features of congestive heart failure with cardiomegaly and predominantly biatrial enlargement and three-vessel coronary artery atherosclerosis. Reflux of contrast into the IVC and hepatic veins may suggest right-sided heart elevated right heart pressures in the setting of fluid overload. 3.  Aortic Atherosclerosis (ICD10-I70.0). 4. Multilevel mild splanchnic and renal artery ostial narrowing with more mild narrowing at the IMA origin as well. 5. 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 Nonvascular: 1. Stigmata of cirrhosis with a heterogeneous, nodular liver. 2. Additional features of anasarca with diffuse body wall edema, small volume ascites, and mesenteric edematous changes. 3. Fairly focal phlegmonous change along the anterior chest wall near the sternomanubrial margin with mild retrosternal stranding as  well. No organized rim enhancing collection or abscess nor destructive changes of the sternomanubrial joint at this time. Findings are nonspecific though could reflect early infection/sternomanubrial osteomyelitis particularly setting of substance use. 4. Slightly more focal retroperitoneal edematous changes appear centered upon the duodenal sweep though may be redistributed. Could correlate for clinical features of duodenitis. 5. Circumferential bladder wall thickening with prostatomegaly, possibly related to chronic outlet obstruction. Recommend assessment of urinary symptoms with urinalysis as clinically warranted to exclude a cystitis. 6. Gallbladder containing some layering intermediate attenuation may reflect biliary sludge or sand but without convincing CT features of acute cholecystitis. Electronically Signed: By: Lovena Le M.D. On: 10/24/2020 23:26        Scheduled Meds: . amiodarone  200 mg Oral Daily  . atorvastatin  20 mg Oral q1800  . DULoxetine  60 mg Oral Daily  . folic acid  1 mg Oral Daily  . furosemide  40 mg Intravenous BID  . gabapentin  600 mg Oral Daily   And  . gabapentin  900 mg Oral QHS  . heparin  5,000 Units Subcutaneous Q8H  . insulin aspart  0-9 Units Subcutaneous TID WC  . isosorbide-hydrALAZINE  1 tablet Oral TID  . LORazepam  0-4 mg Intravenous Q6H   Followed by  . [START ON 10/27/2020] LORazepam  0-4 mg Intravenous Q12H  . multivitamin with minerals  1 tablet Oral Daily  . pantoprazole  40 mg Oral Daily  . potassium chloride SA  20 mEq Oral Daily  . thiamine  100 mg Oral Daily   Or  . thiamine  100 mg Intravenous Daily   Continuous Infusions: . ceFEPime (MAXIPIME) IV    . vancomycin       LOS: 0 days   Time spent: 30min  Alyn C Stevi Hollinshead, DO [facility]  If 7PM-7AM, please contact night-coverage [email or web address]  10/25/2020, 7:57 AM

## 2020-10-25 NOTE — Progress Notes (Signed)
Pt received from ED to 4e25. Oriented x4. CHG bath complete. Tele box connected, CCMD called. VSS. Call bell in reach. Will continue to monitor.  Arletta Bale, RN

## 2020-10-26 ENCOUNTER — Inpatient Hospital Stay (HOSPITAL_COMMUNITY): Payer: Medicaid Other

## 2020-10-26 LAB — CBC
HCT: 29.9 % — ABNORMAL LOW (ref 39.0–52.0)
Hemoglobin: 9.7 g/dL — ABNORMAL LOW (ref 13.0–17.0)
MCH: 27.8 pg (ref 26.0–34.0)
MCHC: 32.4 g/dL (ref 30.0–36.0)
MCV: 85.7 fL (ref 80.0–100.0)
Platelets: 197 10*3/uL (ref 150–400)
RBC: 3.49 MIL/uL — ABNORMAL LOW (ref 4.22–5.81)
RDW: 15.8 % — ABNORMAL HIGH (ref 11.5–15.5)
WBC: 4 10*3/uL (ref 4.0–10.5)
nRBC: 0 % (ref 0.0–0.2)

## 2020-10-26 LAB — GLUCOSE, CAPILLARY
Glucose-Capillary: 132 mg/dL — ABNORMAL HIGH (ref 70–99)
Glucose-Capillary: 139 mg/dL — ABNORMAL HIGH (ref 70–99)
Glucose-Capillary: 155 mg/dL — ABNORMAL HIGH (ref 70–99)
Glucose-Capillary: 154 mg/dL — ABNORMAL HIGH (ref 70–99)

## 2020-10-26 LAB — BASIC METABOLIC PANEL
Anion gap: 9 (ref 5–15)
BUN: 18 mg/dL (ref 6–20)
CO2: 24 mmol/L (ref 22–32)
Calcium: 8.1 mg/dL — ABNORMAL LOW (ref 8.9–10.3)
Chloride: 97 mmol/L — ABNORMAL LOW (ref 98–111)
Creatinine, Ser: 1.56 mg/dL — ABNORMAL HIGH (ref 0.61–1.24)
GFR, Estimated: 51 mL/min — ABNORMAL LOW (ref >60)
Glucose, Bld: 117 mg/dL — ABNORMAL HIGH (ref 70–99)
Potassium: 4.1 mmol/L (ref 3.5–5.1)
Sodium: 130 mmol/L — ABNORMAL LOW (ref 135–145)

## 2020-10-26 IMAGING — CR DG CHEST 2V
2 series · 2 of 2 positions shown · non-contrast
Comparison: 10/13/2019, CT 08/22/2019

CLINICAL DATA: Short of breath

EXAM:
CHEST - 2 VIEW

[chest pa]
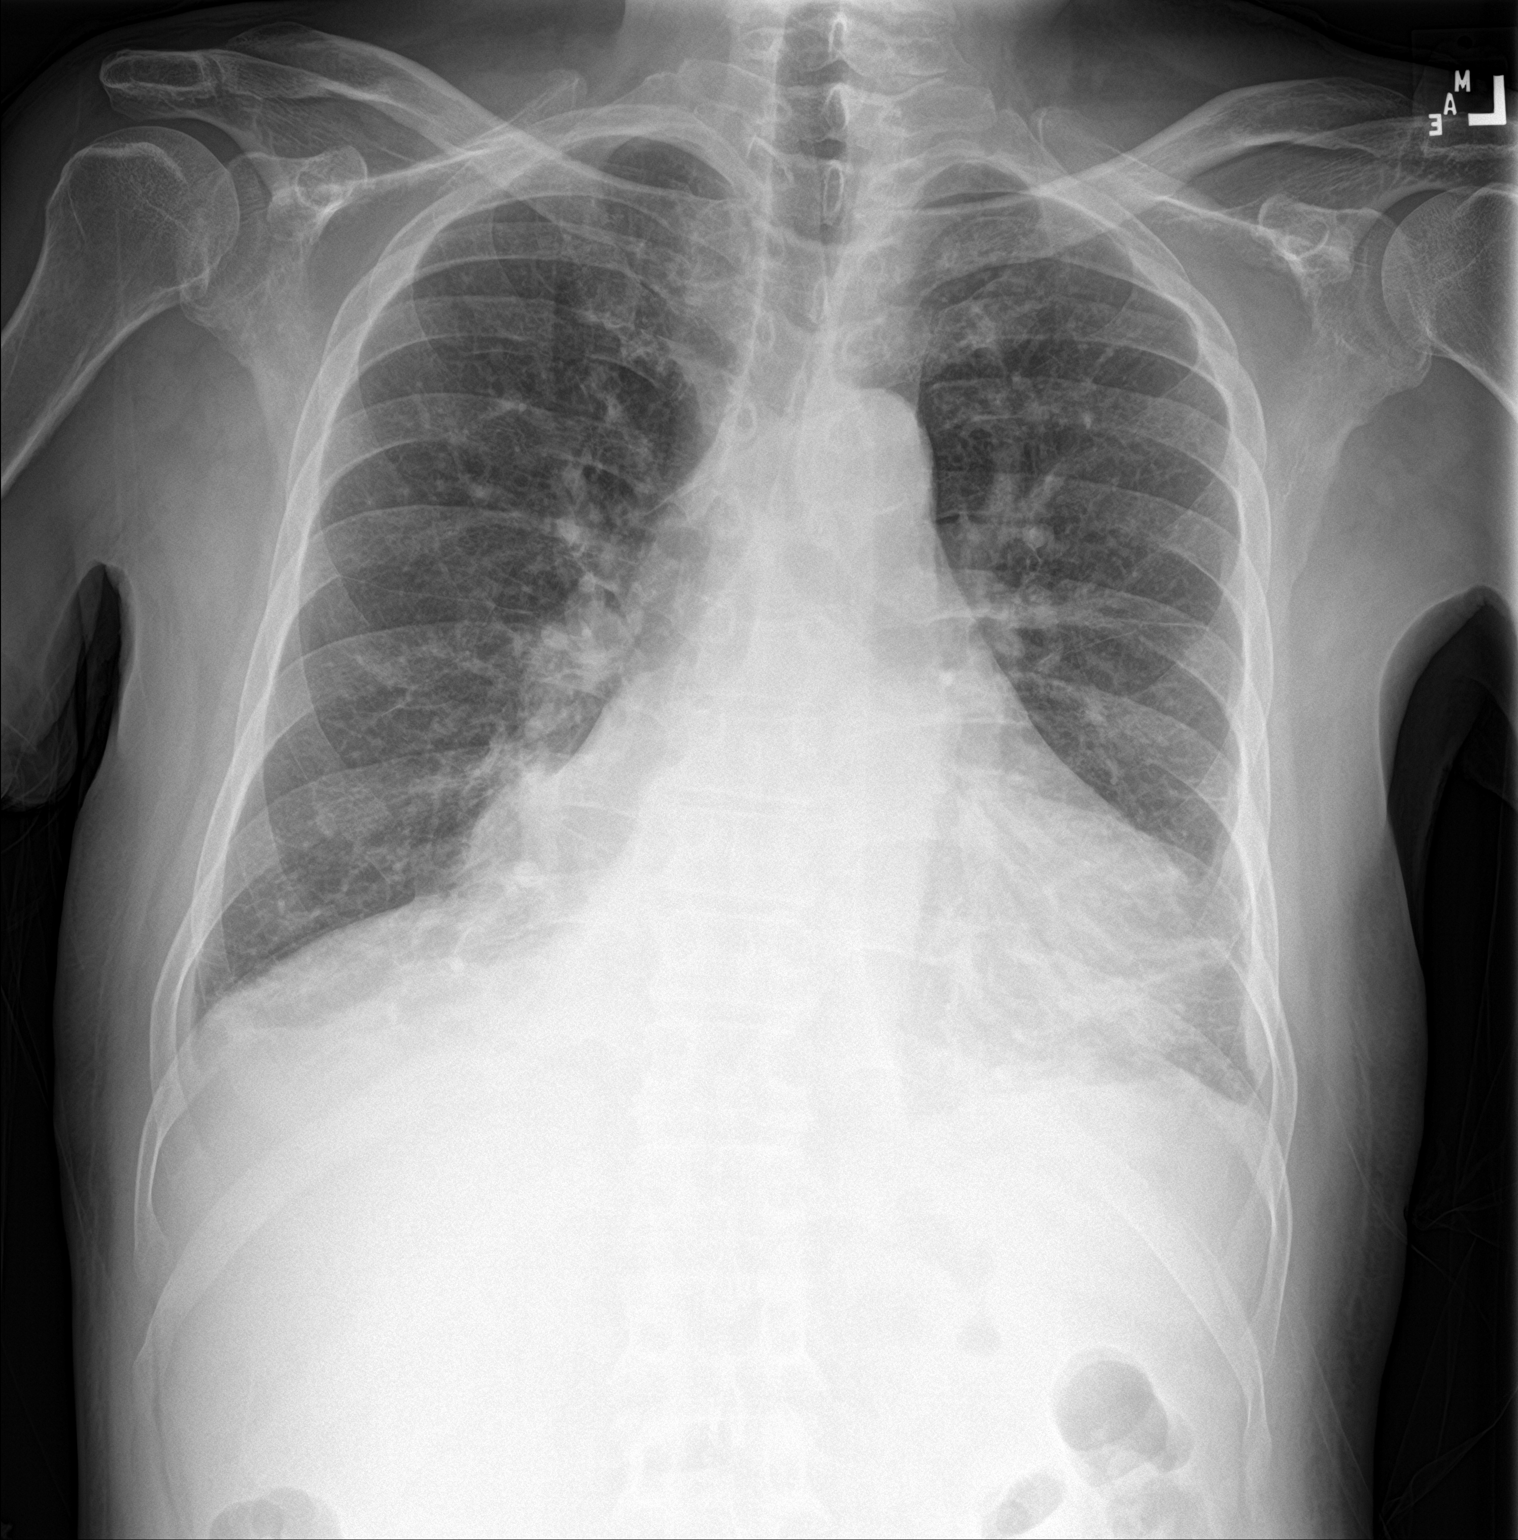

[chest lat]
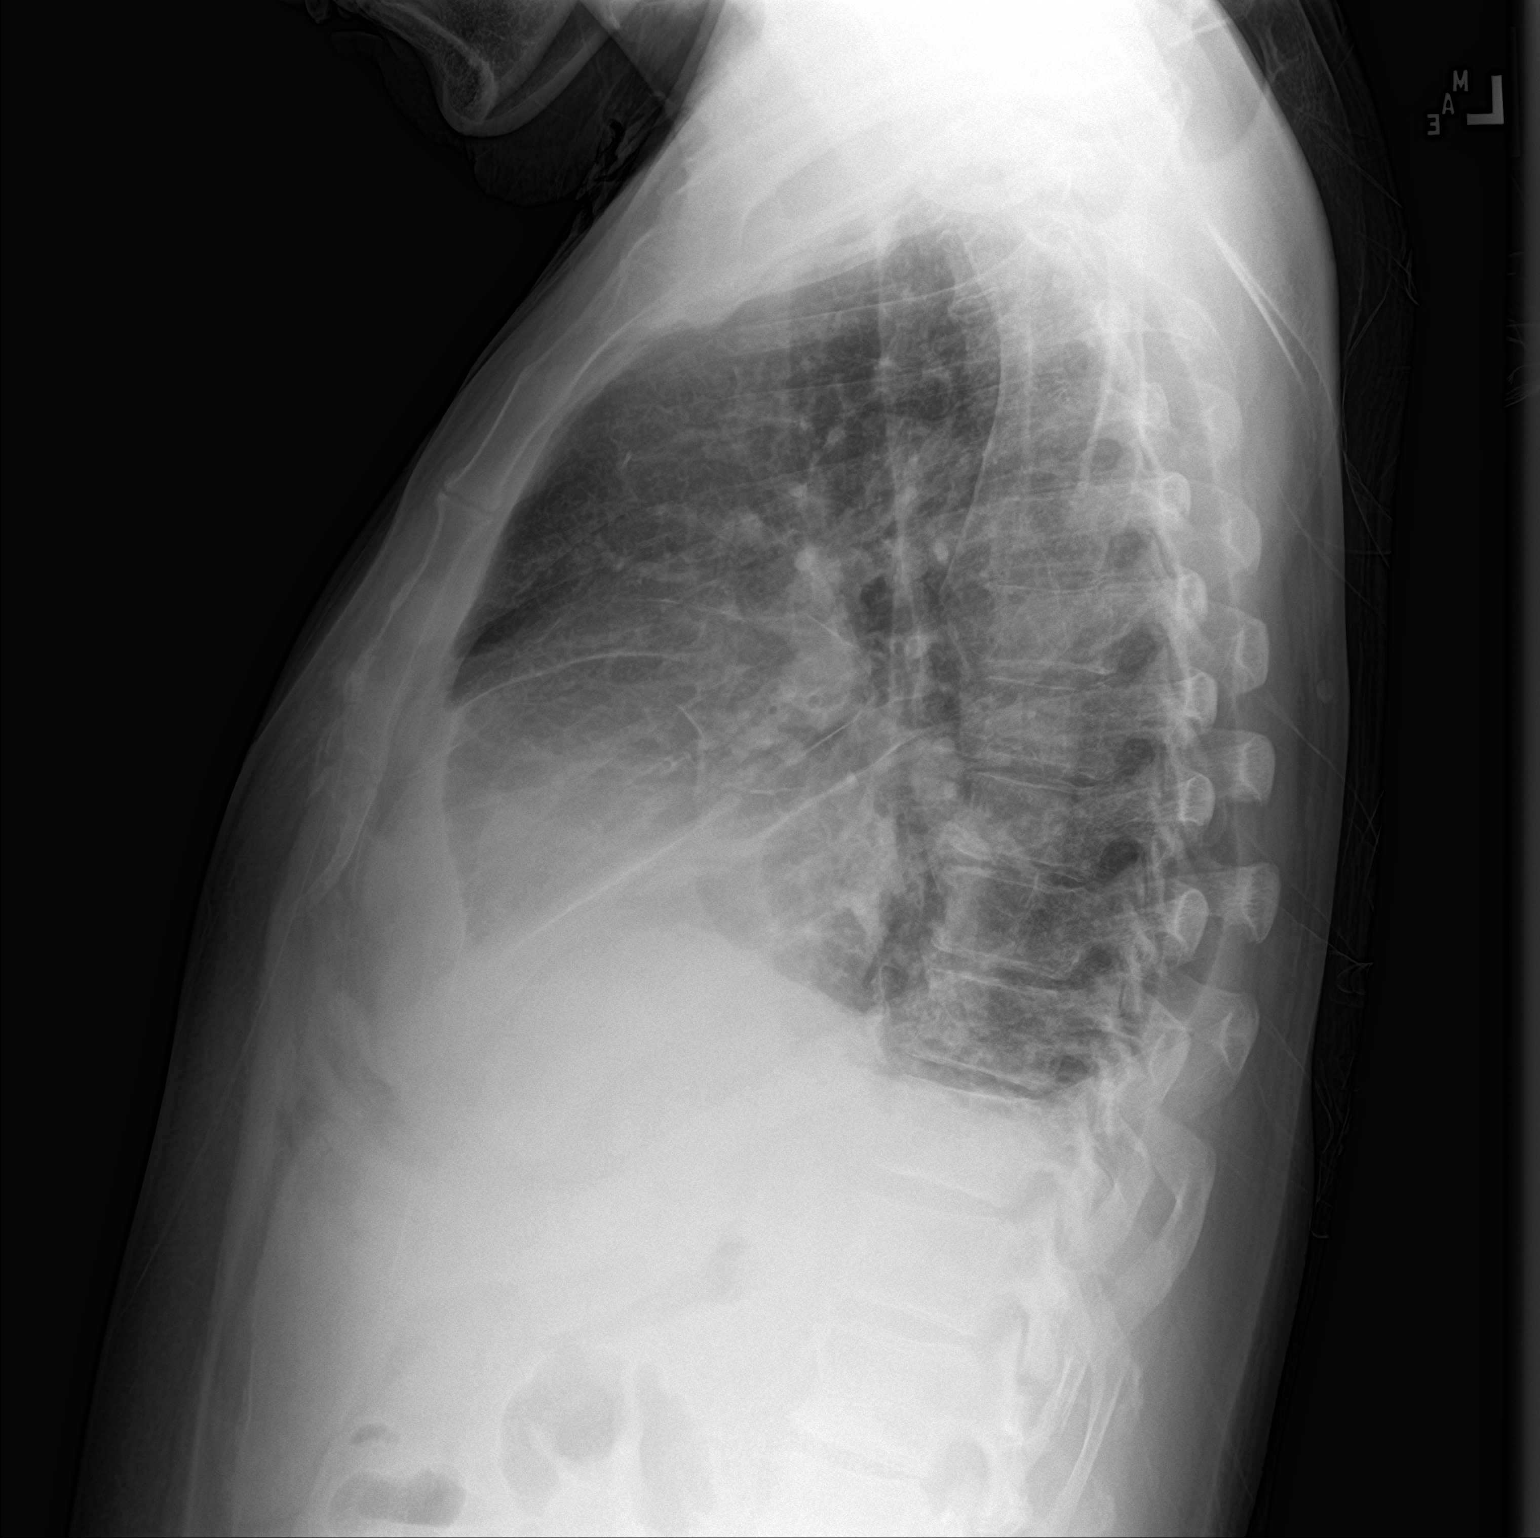

[2 of 2 positions shown; findings below may reference images not displayed]

FINDINGS: Persistent trace pleural effusions, decreased compared to prior.
Improved aeration of the bases. Residual atelectasis at the bases.
Enlarged cardiomediastinal silhouette. Aortic atherosclerosis. No
pneumothorax
IMPRESSION: 1. Small bilateral pleural effusions, decreased compared to prior
radiograph with residual atelectasis at the bases.
2. Cardiomegaly

## 2020-10-26 MED ORDER — OXYCODONE-ACETAMINOPHEN 5-325 MG PO TABS: 1.0000 | ORAL_TABLET | Freq: Once | ORAL | Status: DC

## 2020-10-26 MED ORDER — ONDANSETRON HCL 4 MG PO TABS
4.0000 mg | ORAL_TABLET | Freq: Four times a day (QID) | ORAL | Status: DC | PRN
  Administered 2020-10-26 (×2): 4 mg via ORAL
  Filled 2020-10-26 (×2): qty 1

## 2020-10-26 NOTE — Progress Notes (Signed)
PROGRESS NOTE    Brad [name]  PQZ:[id number] DOB: 10-Aug-1961 DOA: 10/24/2020 PCP: Charlott Rakes, MD   Brief Narrative:  Brad [name] is a 59 y.o. male with history of alcohol abuse and cocaine abuse with history of chronic combined CHF, diabetes mellitus who was just discharged about 4 days ago after being admitted for CHF presents to the ER with complaints of increasing abdominal discomfort nausea vomiting and shortness of breath or chest pain.  Patient states he has been compliant with his medication as advised.  Admits to drinking alcohol every day.  Denies any blood in the vomitus and also has been some diarrhea which was watery. In ED - given the significant pain patient underwent CT angiogram of the chest abdomen pelvis to rule out dissection.  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  In addition it showed ascites with narrowing of the IMA renal artery and the splanchnic vessels.  Patient was started on Lasix empiric antibiotics and admitted for further management.  Labs are significant for hemoglobin of 12.4 sodium was 122.  EKG showing A. fib rate controlled.  BNP was 834 Covid test was negative.  Assessment & Plan:   Active Problems:   Polysubstance abuse ([place])   DM type 2 (diabetes mellitus, type 2) (HCC)   CKD (chronic kidney disease) stage 3, GFR 30-59 ml/min (HCC)   CAD (coronary artery disease), native coronary artery   Permanent atrial fibrillation   Acute exacerbation of CHF (congestive heart failure) (HCC)   Hyponatremia with normal extracellular fluid volume   Acute CHF (congestive heart failure) (HCC)   Osteomyelitis of sternum (HCC)   Acute on chronic systolic and diastolic CHF in acute exacerbation, POA - last EF measured in August 2021 was 30 to 35%.   -Continue Lasix 40 mg IV every 12 along with patient is also on BiDil.   -Follow I's and O's, daily weights, strict  low-salt fluid restricted diet  Sternal osteomyelitis unlikely -Patient remains without fever, leukocytosis or reproducible chest pain -Discontinue antibiotics given patient does not meet sepsis criteria and given only questionable infection based on imaging without complaint/issues  Illicit substance abuse, cocaine and alcohol abuse with known cirrhosis and ascites  -Patient admits to ongoing alcohol and cocaine abuse but states," I am going to stop" -We discussed the need for both dietary, lifestyle and medication compliance in the setting of worsening heart failure exacerbation as above  -Patient diuresing well, hold off on paracentesis at this time -Continue CIWA protocol for known alcohol use/abuse  History of A. fib not on anticoagulation due to risk of falls with history of alcoholism.  -Patient currently on amiodarone only, rate moderately well controlled at this time  Diabetes mellitus type 2 -Continue sliding scale insulin, hypoglycemic protocol Lab Results  Component Value Date   HGBA1C 6.6 (H) 10/18/2020   Chronic anemia of chronic disease, stable at baseline  Hypervolemic hyponatremia in the setting of above  Continue to diuresis, sodium titrating upwards appropriately, continue to follow to ensure no overcorrection or rapid correction   Incidentally noted narrowing of the IMA renal arteries and splanchnic vessels. -Currently no indication for intervention, follow-up outpatient imaging per protocol   DVT prophylaxis: Heparin. Code Status: Full code.   Family Communication: None present  Status is: Inpatient  Dispo: The patient is from: Home              Anticipated d/c is to: Home  Anticipated d/c date is: 24-48 hours pending clinical course              Patient currently not medically stable for discharge given need for ongoing close monitoring, IV diuresis and possible need for further intervention or testing in the setting of abdominal ascites,  alcohol use/abuse and cocaine use concerning for possible withdrawal.  Consultants:   None  Procedures:   None planned  Antimicrobials:  Vancomycin, cefepime x1 dose 10/24/2020, discontinued  Subjective: No acute issues or events overnight, patient continues to complain of diffuse aches and pain that appears to be chronic but otherwise denies chest pain, shortness of breath, nausea, vomiting, diarrhea, constipation, headache, fevers, chills.  Objective: Vitals:   10/25/20 1951 10/25/20 2249 10/26/20 0245 10/26/20 0436  BP: (!) 97/58 (!) 106/92 (!) 106/92 126/73  Pulse: 61 70 70 67  Resp: 16 14 14 17   Temp: 97.9 F (36.6 C) 97.6 F (36.4 C) 97.6 F (36.4 C) 98.5 F (36.9 C)  TempSrc: Oral Oral Oral Oral  SpO2: 99% 94%  95%  Weight:   84.5 kg   Height:   5\' 11"  (1.803 m)     Intake/Output Summary (Last 24 hours) at 10/26/2020 0720 Last data filed at 10/26/2020 0645 Gross per 24 hour  Intake 300 ml  Output 750 ml  Net -450 ml   Filed Weights   10/26/20 0245  Weight: 84.5 kg    Examination:  General:  Pleasantly resting in bed, No acute distress. HEENT:  Normocephalic atraumatic.  Sclerae nonicteric, noninjected.  Extraocular movements intact bilaterally. Neck:  Without mass or deformity.  Trachea is midline. Lungs:  Clear to auscultate bilaterally without rhonchi, wheeze, or rales. Heart:  Regular rate and rhythm.  Without murmurs, rubs, or gallops. Abdomen:  Soft, nontender, nondistended.  Without guarding or rebound. Extremities: Without cyanosis, clubbing, or obvious deformity.  1+ pitting edema to lower extremities bilaterally Vascular:  Dorsalis pedis and posterior tibial pulses palpable bilaterally. Skin:  Warm and dry, no erythema, no ulcerations.   Data Reviewed: I have personally reviewed following labs and imaging studies  CBC: Recent Labs  Lab 10/20/20 1130 10/21/20 0940 10/22/20 0540 10/24/20 1850 10/26/20 0049  WBC 3.3* 3.9* 4.2 4.0 4.0    NEUTROABS 2.0 2.5 3.2  --   --   HGB 9.9* 11.4* 10.3* 12.4* 9.7*  HCT 29.8* 34.9* 31.8* 38.4* 29.9*  MCV 85.4 87.7 89.1 89.3 85.7  PLT 219 209 217 275 170   Basic Metabolic Panel: Recent Labs  Lab 10/20/20 1130 10/21/20 0940 10/21/20 1649 10/22/20 0540 10/24/20 1850 10/25/20 0252 10/25/20 0633 10/25/20 1044 10/26/20 0049  NA 129*   < >   < >  --  122* 125* 127* 129* 130*  K 3.9   < >   < >  --  4.4 4.1 4.1 4.0 4.1  CL 97*   < >   < >  --  92* 92* 92* 95* 97*  CO2 25   < >   < >  --  19* 23 24 26 24   GLUCOSE 123*   < >   < >  --  125* 114* 109* 95 117*  BUN 13   < >   < >  --  13 13 14 16 18   CREATININE 1.37*   < >   < >  --  1.12 1.22 1.26* 1.38* 1.56*  CALCIUM 7.9*   < >   < >  --  8.2* 8.2*  8.2* 8.1* 8.1*  MG 2.0  --   --  1.8  --   --   --   --   --    < > = values in this interval not displayed.   GFR: Estimated Creatinine Clearance: 53.6 mL/min (A) (by C-G formula based on SCr of 1.56 mg/dL (H)). Liver Function Tests: No results for input(s): AST, ALT, ALKPHOS, BILITOT, PROT, ALBUMIN in the last 168 hours. No results for input(s): LIPASE, AMYLASE in the last 168 hours. No results for input(s): AMMONIA in the last 168 hours. Coagulation Profile: No results for input(s): INR, PROTIME in the last 168 hours. Cardiac Enzymes: No results for input(s): CKTOTAL, CKMB, CKMBINDEX, TROPONINI in the last 168 hours. BNP (last 3 results) No results for input(s): PROBNP in the last 8760 hours. HbA1C: No results for input(s): HGBA1C in the last 72 hours. CBG: Recent Labs  Lab 10/25/20 0834 10/25/20 1258 10/25/20 1614 10/25/20 2120 10/26/20 0615  GLUCAP 137* 103* 118* 175* 132*   Lipid Profile: No results for input(s): CHOL, HDL, LDLCALC, TRIG, CHOLHDL, LDLDIRECT in the last 72 hours. Thyroid Function Tests: No results for input(s): TSH, T4TOTAL, FREET4, T3FREE, THYROIDAB in the last 72 hours. Anemia Panel: No results for input(s): VITAMINB12, FOLATE, FERRITIN, TIBC,  IRON, RETICCTPCT in the last 72 hours. Sepsis Labs: No results for input(s): PROCALCITON, LATICACIDVEN in the last 168 hours.  Recent Results (from the past 240 hour(s))  Resp Panel by RT PCR (RSV, Flu A&B, Covid) - Nasopharyngeal Swab     Status: None   Collection Time: 10/17/20  8:26 PM   Specimen: Nasopharyngeal Swab  Result Value Ref Range Status   SARS Coronavirus 2 by RT PCR NEGATIVE NEGATIVE Final    Comment: (NOTE) SARS-CoV-2 target nucleic acids are NOT DETECTED.  The SARS-CoV-2 RNA is generally detectable in upper respiratoy specimens during the acute phase of infection. The lowest concentration of SARS-CoV-2 viral copies this assay can detect is 131 copies/mL. A negative result does not preclude SARS-Cov-2 infection and should not be used as the sole basis for treatment or other patient management decisions. A negative result may occur with  improper specimen collection/handling, submission of specimen other than nasopharyngeal swab, presence of viral mutation(s) within the areas targeted by this assay, and inadequate number of viral copies (<131 copies/mL). A negative result must be combined with clinical observations, patient history, and epidemiological information. The expected result is Negative.  Fact Sheet for Patients:  PinkCheek.be  Fact Sheet for Healthcare Providers:  GravelBags.it  This test is no t yet approved or cleared by the [place] FDA and  has been authorized for detection and/or diagnosis of SARS-CoV-2 by FDA under an Emergency Use Authorization (EUA). This EUA will remain  in effect (meaning this test can be used) for the duration of the COVID-19 declaration under Section 564(b)(1) of the Act, 21 U.S.C. section 360bbb-3(b)(1), unless the authorization is terminated or revoked sooner.     Influenza A by PCR NEGATIVE NEGATIVE Final   Influenza B by PCR NEGATIVE NEGATIVE Final     Comment: (NOTE) The Xpert Xpress SARS-CoV-2/FLU/RSV assay is intended as an aid in  the diagnosis of influenza from Nasopharyngeal swab specimens and  should not be used as a sole basis for treatment. Nasal washings and  aspirates are unacceptable for Xpert Xpress SARS-CoV-2/FLU/RSV  testing.  Fact Sheet for Patients: PinkCheek.be  Fact Sheet for Healthcare Providers: GravelBags.it  This test is not yet approved or cleared by the  [place] [employer] and  has been authorized for detection and/or diagnosis of SARS-CoV-2 by  FDA under an Print production planner (EUA). This EUA will remain  in effect (meaning this test can be used) for the duration of the  Covid-19 declaration under Section 564(b)(1) of the Act, 21  U.S.C. section 360bbb-3(b)(1), unless the authorization is  terminated or revoked.    Respiratory Syncytial Virus by PCR NEGATIVE NEGATIVE Final    Comment: (NOTE) Fact Sheet for Patients: PinkCheek.be  Fact Sheet for Healthcare Providers: GravelBags.it  This test is not yet approved or cleared by the [place] FDA and  has been authorized for detection and/or diagnosis of SARS-CoV-2 by  FDA under an Emergency Use Authorization (EUA). This EUA will remain  in effect (meaning this test can be used) for the duration of the  COVID-19 declaration under Section 564(b)(1) of the Act, 21 U.S.C.  section 360bbb-3(b)(1), unless the authorization is terminated or  revoked. Performed at [facility], [place] [address] [place], [place] [address]   Resp Panel by RT PCR (RSV, Flu A&B, Covid) - Nasopharyngeal Swab     Status: None   Collection Time: 10/25/20 12:10 AM   Specimen: Nasopharyngeal Swab  Result Value Ref Range Status   SARS Coronavirus 2 by RT PCR NEGATIVE NEGATIVE Final    Comment: (NOTE) SARS-CoV-2 target nucleic acids are NOT DETECTED.  The  SARS-CoV-2 RNA is generally detectable in upper respiratoy specimens during the acute phase of infection. The lowest concentration of SARS-CoV-2 viral copies this assay can detect is 131 copies/mL. A negative result does not preclude SARS-Cov-2 infection and should not be used as the sole basis for treatment or other patient management decisions. A negative result may occur with  improper specimen collection/handling, submission of specimen other than nasopharyngeal swab, presence of viral mutation(s) within the areas targeted by this assay, and inadequate number of viral copies (<131 copies/mL). A negative result must be combined with clinical observations, patient history, and epidemiological information. The expected result is Negative.  Fact Sheet for Patients:  PinkCheek.be  Fact Sheet for Healthcare Providers:  GravelBags.it  This test is no t yet approved or cleared by the [place] FDA and  has been authorized for detection and/or diagnosis of SARS-CoV-2 by FDA under an Emergency Use Authorization (EUA). This EUA will remain  in effect (meaning this test can be used) for the duration of the COVID-19 declaration under Section 564(b)(1) of the Act, 21 U.S.C. section 360bbb-3(b)(1), unless the authorization is terminated or revoked sooner.     Influenza A by PCR NEGATIVE NEGATIVE Final   Influenza B by PCR NEGATIVE NEGATIVE Final    Comment: (NOTE) The Xpert Xpress SARS-CoV-2/FLU/RSV assay is intended as an aid in  the diagnosis of influenza from Nasopharyngeal swab specimens and  should not be used as a sole basis for treatment. Nasal washings and  aspirates are unacceptable for Xpert Xpress SARS-CoV-2/FLU/RSV  testing.  Fact Sheet for Patients: PinkCheek.be  Fact Sheet for Healthcare Providers: GravelBags.it  This test is not yet approved or cleared  by the [place] FDA and  has been authorized for detection and/or diagnosis of SARS-CoV-2 by  FDA under an Emergency Use Authorization (EUA). This EUA will remain  in effect (meaning this test can be used) for the duration of the  Covid-19 declaration under Section 564(b)(1) of the Act, 21  U.S.C. section 360bbb-3(b)(1), unless the authorization is  terminated or revoked.    Respiratory Syncytial  Virus by PCR NEGATIVE NEGATIVE Final    Comment: (NOTE) Fact Sheet for Patients: PinkCheek.be  Fact Sheet for Healthcare Providers: GravelBags.it  This test is not yet approved or cleared by the [place] FDA and  has been authorized for detection and/or diagnosis of SARS-CoV-2 by  FDA under an Emergency Use Authorization (EUA). This EUA will remain  in effect (meaning this test can be used) for the duration of the  COVID-19 declaration under Section 564(b)(1) of the Act, 21 U.S.C.  section 360bbb-3(b)(1), unless the authorization is terminated or  revoked. Performed at [facility], [place] [address], [place], [place] [address]   Culture, blood (routine x 2)     Status: None (Preliminary result)   Collection Time: 10/25/20 12:10 AM   Specimen: BLOOD  Result Value Ref Range Status   Specimen Description BLOOD LEFT ANTECUBITAL  Final   Special Requests   Final    BOTTLES DRAWN AEROBIC AND ANAEROBIC Blood Culture results may not be optimal due to an excessive volume of blood received in culture bottles   Culture   Final    NO GROWTH 1 DAY Performed at [facility], [place] [address], [place], [place] [address]    Report Status PENDING  Incomplete  Culture, blood (routine x 2)     Status: None (Preliminary result)   Collection Time: 10/25/20 12:10 AM   Specimen: BLOOD RIGHT HAND  Result Value Ref Range Status   Specimen Description BLOOD RIGHT HAND  Final   Special Requests   Final    BOTTLES DRAWN AEROBIC AND  ANAEROBIC Blood Culture adequate volume   Culture   Final    NO GROWTH 1 DAY Performed at [facility], [place] [address], [place], [place] [address]    Report Status PENDING  Incomplete    Radiology Studies: DG Chest 2 View  Result Date: 10/24/2020 CLINICAL DATA:  Chest pain EXAM: CHEST - 2 VIEW COMPARISON:  10/08/2020 FINDINGS: Cardiomegaly, vascular congestion. Bibasilar atelectasis. No effusions or overt edema. No acute bony abnormality. IMPRESSION: Cardiomegaly with vascular congestion. Bibasilar atelectasis. Electronically Signed   By: Rolm Baptise M.D.   On: 10/24/2020 19:16   CT Angio Chest/Abd/Pel for Dissection W and/or W/WO  Addendum Date: 10/24/2020   ADDENDUM REPORT: 10/24/2020 23:40 ADDENDUM: Results and case discussion with the ordering provider via telephone on 10/24/2020 at 11:40 pm to provider Sherol Dade , who verbally acknowledged these results. Electronically Signed   By: Lovena Le M.D.   On: 10/24/2020 23:40   Result Date: 10/24/2020 CLINICAL DATA:  Chest and abdominal pain, concern for dissection, history of substance abuse, advanced cardiac history, CKD and hepatitis C EXAM: CT ANGIOGRAPHY CHEST, ABDOMEN AND PELVIS TECHNIQUE: Non-contrast CT of the chest was initially obtained. Multidetector CT imaging through the chest, abdomen and pelvis was performed using the standard protocol during bolus administration of intravenous contrast. Multiplanar reconstructed images and MIPs were obtained and reviewed to evaluate the vascular anatomy. CONTRAST:  188mL OMNIPAQUE IOHEXOL 350 MG/ML SOLN COMPARISON:  CT angiography 06/21/2020, chest radiograph 10/24/2020, CT abdomen and pelvis 10/18/2020 FINDINGS: CTA CHEST FINDINGS Cardiovascular: Noncontrast CT of the chest was performed initially. There is a normal caliber aorta with scattered atherosclerotic calcifications. No abnormal hyperdense mural thickening or plaque displacement is seen to suggest intramural hematoma.  Postcontrast administration, there is satisfactory preferential opacification of the thoracic aorta. No acute luminal abnormality of the imaged aorta. No periaortic stranding or hemorrhage. Normal 3 vessel branching  of the aortic arch. Proximal great vessels are mildly calcified but otherwise unremarkable without acute luminal abnormality. Central pulmonary arteries are normal caliber. No large central or lobar filling defects present within the limitations this non tailored evaluation of the pulmonary arteries. There is mild cardiomegaly with predominantly biatrial enlargement and three-vessel coronary artery atherosclerosis. No pericardial effusion. Small volume of fluid in the pericardial recesses is upper limits of physiologic normal. Mediastinum/Nodes: Minimal retrosternal thickening along the left posterolateral margin of the sternomanubrial joint. No other mediastinal fluid or gas. Normal thyroid gland and thoracic inlet. No acute abnormality of the trachea or esophagus. No worrisome mediastinal, hilar or axillary adenopathy. Scattered subcentimeter low-attenuation mediastinal nodes may be edematous or reactive. Lungs/Pleura: Redistribution of pulmonary vascularity with vascular cuffing and interlobular septal thickening most pronounced towards the lung apices and bilateral bases suggesting at least mild interstitial edema. Moderate right and small left pleural effusions with adjacent areas of passive atelectasis. Additional areas of bandlike opacity likely reflecting further subsegmental atelectasis or scarring. Some more dependent ground-glass favors additional atelectasis as well. No focal consolidative opacity. No pneumothorax Musculoskeletal: Mild dextrocurvature of the spine, possibly positional. There is fairly focal phlegmonous change along the anterior chest wall near the sternomanubrial margin albeit without organized rim enhancing collection or abscess nor adjacent erosive or destructive changes of  the sternum joint at this time. Some multilevel discogenic changes are present without features of acute osteomyelitis or discitis. Some asymmetric degenerative changes of the medial head clavicles are similar to prior. Additional mild degenerative changes in both shoulders as imaged. Review of the MIP images confirms the above findings. CTA ABDOMEN AND PELVIS FINDINGS VASCULAR Aorta: Atherosclerotic plaque within the normal caliber aorta. No aneurysm or ectasia. No acute luminal abnormality. No periaortic stranding or hemorrhage. Celiac: Mild ostial plaque narrowing with some poststenotic dilatation of the proximal celiac to 12 mm in diameter (7/150), unchanged from comparison. No other significant narrowing, dilatation or ectasia is seen. SMA: Mild ostial plaque narrowing without aneurysm, 10 Jia, stenosis or occlusion. No acute luminal abnormality is seen. Renals: Single renal arteries bilaterally. Ostial plaque bilaterally with mild narrowing, right slightly greater than left. No other significant stenosis or occlusion. No acute luminal abnormality. No aneurysm or ectasia or features of fibromuscular dysplasia. IMA: Moderate ostial plaque narrowing. Otherwise normally opacified. Normal branching without acute luminal abnormality or occlusion. Inflow: Atherosclerotic calcification in the common, internal external iliac artery branches without stenosis, occlusion or acute luminal abnormality. Proximal outflow vessels including the common superficial and deep femoral arteries are mildly calcified but without significant stenosis, occlusion or acute luminal abnormality. Veins: Reflux of contrast into the IVC and hepatic veins. No other major venous abnormality within limitations of this arterial phase exam. Review of the MIP images confirms the above findings. NON-VASCULAR Hepatobiliary: Hepatomegaly with a nodular liver surface contour. No concerning focal liver lesion. Gallbladder containing some layering  intermediate attenuation may reflect biliary sludge or sand. No pericholecystic fluid or inflammation. No intraductal gallstones. No biliary ductal dilatation. Pancreas: No pancreatic ductal dilatation or surrounding inflammatory changes. Spleen: Normal in size. No concerning splenic lesions. Adrenals/Urinary Tract: Normal adrenal glands. Stable mild symmetric bilateral perinephric stranding, a nonspecific finding particularly given some circumferential bladder wall thickening. Fluid attenuation cyst present in the lower pole left kidney is similar to comparison. No concerning renal mass. Urolithiasis or hydronephrosis. No visible bladder calculi or debris. Stomach/Bowel: Distal esophagus and stomach are unremarkable. There are some mild edematous retroperitoneal changes centered upon the second and  third portions of the duodenal sweep as across the midline abdomen. Wall thickening is difficult to assess given underdistention and lack of enteric contrast media. No other small bowel thickening or stranding is seen. A normal appendix is visualized. No colonic dilatation or wall thickening. Rectal stool ball without significant wall thickening. Presacral fat stranding is nonspecific given additional edematous features elsewhere. Lymphatic: No suspicious or enlarged lymph nodes in the included lymphatic chains. Reproductive: Prostatomegaly. Prostate seminal vesicles are otherwise unremarkable. Other: There is diffuse body wall edema. There are nonspecific retroperitoneal edematous changes, which is questionably centered upon the duodenal sweep though is distributed within the pararenal spaces and more inferiorly as well. Small volume of free intraperitoneal fluid is also present layering within the pericolic gutters and presacral space as well. No free air. No bowel containing hernias. Musculoskeletal: No acute osseous abnormality or suspicious osseous lesion. Multilevel degenerative changes are present in the imaged  portions of the spine. Mild degenerative changes in the hips and pelvis. Review of the MIP images confirms the above findings. IMPRESSION: Vascular: 1. No evidence of acute aortic syndrome. 2. Features of congestive heart failure with cardiomegaly and predominantly biatrial enlargement and three-vessel coronary artery atherosclerosis. Reflux of contrast into the IVC and hepatic veins may suggest right-sided heart elevated right heart pressures in the setting of fluid overload. 3.  Aortic Atherosclerosis (ICD10-I70.0). 4. Multilevel mild splanchnic and renal artery ostial narrowing with more mild narrowing at the IMA origin as well. 5. 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 Nonvascular: 1. Stigmata of cirrhosis with a heterogeneous, nodular liver. 2. Additional features of anasarca with diffuse body wall edema, small volume ascites, and mesenteric edematous changes. 3. Fairly focal phlegmonous change along the anterior chest wall near the sternomanubrial margin with mild retrosternal stranding as well. No organized rim enhancing collection or abscess nor destructive changes of the sternomanubrial joint at this time. Findings are nonspecific though could reflect early infection/sternomanubrial osteomyelitis particularly setting of substance use. 4. Slightly more focal retroperitoneal edematous changes appear centered upon the duodenal sweep though may be redistributed. Could correlate for clinical features of duodenitis. 5. Circumferential bladder wall thickening with prostatomegaly, possibly related to chronic outlet obstruction. Recommend assessment of urinary symptoms with urinalysis as clinically warranted to exclude a cystitis. 6. Gallbladder containing some layering intermediate attenuation may reflect biliary sludge or sand but without convincing CT features of acute cholecystitis. Electronically Signed: By: Lovena Le M.D. On: 10/24/2020 23:26        Scheduled Meds: .  amiodarone  200 mg Oral Daily  . atorvastatin  20 mg Oral q1800  . DULoxetine  60 mg Oral Daily  . folic acid  1 mg Oral Daily  . furosemide  40 mg Intravenous BID  . gabapentin  600 mg Oral Daily   And  . gabapentin  900 mg Oral QHS  . heparin  5,000 Units Subcutaneous Q8H  . insulin aspart  0-9 Units Subcutaneous TID WC  . isosorbide-hydrALAZINE  1 tablet Oral TID  . LORazepam  0-4 mg Intravenous Q6H   Followed by  . [START ON 10/27/2020] LORazepam  0-4 mg Intravenous Q12H  . multivitamin with minerals  1 tablet Oral Daily  . pantoprazole  40 mg Oral Daily  . potassium chloride SA  20 mEq Oral Daily  . thiamine  100 mg Oral Daily   Or  . thiamine  100 mg Intravenous Daily   Continuous Infusions:    LOS: 1 day   Time  spent: 29min  Edi C Mattthew Ziomek, DO [facility]  If 7PM-7AM, please contact night-coverage [email or web address]  10/26/2020, 7:20 AM

## 2020-10-26 NOTE — Progress Notes (Signed)
Pt heard falling from hallway.  Explains he was going to the bathroom and leaned on the bedside table which rolled away.  Assisted back to bed, VS stable, no injuiry.  MD made aware.  Pt now designated high fall rise.  Yellow armband applied, bed alarm on and floor mats placed.  Pt understands need to call for help any time he wants to get up.

## 2020-10-26 NOTE — Plan of Care (Signed)
  Problem: Pain Managment: Goal: General experience of comfort will improve Outcome: Progressing   Problem: Skin Integrity: Goal: Risk for impaired skin integrity will decrease Outcome: Progressing   

## 2020-10-26 NOTE — Plan of Care (Signed)
  Problem: Clinical Measurements: Goal: Respiratory complications will improve Outcome: Progressing Goal: Cardiovascular complication will be avoided Outcome: Progressing   

## 2020-10-26 NOTE — Progress Notes (Signed)
Notified on call MD about patient's c/o pain in the neck and back. Patient stated that, "it just started hurting". Post fall, patient did not c/o pain". Order for xray placed. Will continue to monitor

## 2020-10-26 NOTE — Progress Notes (Signed)
Pt ambulated hall approx 200 ft using RW, stand by assist.  Tendency to lean to left.  Back to bed after walk.  Will con't plan of care.

## 2020-10-26 NOTE — Progress Notes (Addendum)
HOSPITAL MEDICINE OVERNIGHT EVENT NOTE    Notified by nursing that patient continues to complain of neck pain since falling out of bed earlier in the evening.  Patient is neurologically intact and is awake alert and oriented x3.  Chart reviewed.  Patient admitted for acute congestive heart failure.  Patient has known history of polysubstance abuse.  Patient suffering from renal insufficiency based on review of labs.  Will obtain cervical spine x-ray.    Vernelle Emerald  MD [facility]   ADDENDUM 11/6 4AM  Patient continuing to complain of neck pain.  C-spine x-ray ordered earlier reveals no acute fracture or complication of cervical hardware.  Patient continues to be neurologically intact.  Will order one-time dose of oral Percocet for substantial pain.   Sherryll Burger Bre[place]

## 2020-10-26 NOTE — Progress Notes (Signed)
Heart Failure Stewardship Pharmacist Progress Note   PCP: Charlott Rakes, MD PCP-Cardiologist: [name], MD    HPI:  59 yo M with PMH of T2DM, HTN, HLD, afib, CHF, CKD III, polysubstance abuse, hep C, alcoholic liver cirrhosis, depression, and GERD. Recently admitted from 10/17/20 to 10/22/20 for HF exacerbation. His last ECHO was done 07/2020 and LVEF was 30-35%.  Patient presents back to ED on 10/24/20 with chest pain and abdominal pain, complicated by alcohol and cocaine use. Admitted for acute HF exacerbation and rule out sternal osteomyelitis.   Current HF Medications: Furosemide 40 mg IV BID BiDil 20/37.5 mg TID  Prior to admission HF Medications: Furosemide 40 mg BID BiDil 20/37.5 mg TID Farxiga 10 mg daily *Potassium chloride 20 meq daily  *Sodium tabs 2 g TID  Pertinent Lab Values: . Serum creatinine 1.38>1.56, BUN 18, Potassium 4.1, Sodium 130, BNP 834.0   Vital Signs: . Weight: 186 lb . Blood pressure: 100-120/70-90s . Heart rate: 70s  Medication Assistance / Insurance Benefits Check: Does the patient have prescription insurance?  Yes Type of insurance plan: [place] [employer]  Outpatient Pharmacy:  Prior to admission outpatient pharmacy: [facility] Is the patient willing to use [facility] at discharge? Yes Is the patient willing to transition their outpatient pharmacy to utilize a [facility] outpatient pharmacy?   Pending    Assessment: 1. Acute on chronic systolic CHF (EF 66-59%), probably due to substance abuse (ETOH, cocaine use). NYHA class II/III symptoms. - Continue furosemide 40 mg IV BID. Potassium supplement active. Holding off on home sodium replacement as Na is trending upward with diuresis. SCr bump 1.26>1.38>1.56 - pending MD exam, may need to reduce lasix dose - Was on carvedilol in the past but this was stopped due to bradycardia. Consider restarting carvedilol for HF (preferred BB in the setting of ongoing cocaine use - unopposed  alpha) - Continue BiDil 20/37.5 mg TID - Farxiga currently on hold; consider restarting Farxiga 10 mg daily prior to discharge - No ACE/ARB/ARNI due to history of angioedema with lisinopril    Plan: 1) Medication changes recommended at this time: - Consider restarting Farxiga 10 mg daily pending improvement in renal function - May need to reduce lasix dose pending MD exam  2)  Education  - To be completed prior to discharge  Kerby Nora, PharmD, BCPS Heart Failure Stewardship Pharmacist Phone [phone]

## 2020-10-26 NOTE — Progress Notes (Deleted)
Pharmacy Antibiotic Note  Brad [name] is a 59 y.o. male admitted on 10/24/2020 with ?sternal osteomyelitis .  Pharmacy has been consulted for Vancomycin/Cefepime dosing.  -WBC= 4, afebrile -SCr= 1.56 (up from 1.1 on 11/3) -cultures- ngtd   Plan: Change Vancomycin to 750 mg IV q12h Change Cefepime to 2g IV q12h Trend WBC, temp, renal function  F/U infectious work-up Drug levels as indicated  Temp (24hrs), Avg:97.9 F (36.6 C), Min:97.6 F (36.4 C), Max:98.5 F (36.9 C)  Recent Labs  Lab 10/20/20 1130 10/20/20 1130 10/21/20 0940 10/21/20 1649 10/22/20 0540 10/24/20 1850 10/25/20 0252 10/25/20 0633 10/25/20 1044 10/26/20 0049  WBC 3.3*  --  3.9*  --  4.2 4.0  --   --   --  4.0  CREATININE 1.37*   < > 1.35*   < >  --  1.12 1.22 1.26* 1.38* 1.56*   < > = values in this interval not displayed.    Estimated Creatinine Clearance: 53.6 mL/min (A) (by C-G formula based on SCr of 1.56 mg/dL (H)).    Allergies  Allergen Reactions  . Angiotensin Receptor Blockers Anaphylaxis and Other (See Comments)    (Angioedema also with Lisinopril, therefore ARB's are contraindicated)  . Lisinopril Anaphylaxis and Swelling    Throat swelling  . Pamelor [Nortriptyline Hcl] Anaphylaxis and Swelling    Throat swells    Hildred Laser, PharmD Clinical Pharmacist **Pharmacist phone directory can now be found on [email or web address] ([email or web address]).  Listed under [place].

## 2020-10-27 LAB — GLUCOSE, CAPILLARY
Glucose-Capillary: 141 mg/dL — ABNORMAL HIGH (ref 70–99)
Glucose-Capillary: 159 mg/dL — ABNORMAL HIGH (ref 70–99)

## 2020-10-27 LAB — BASIC METABOLIC PANEL
Anion gap: 5 (ref 5–15)
BUN: 16 mg/dL (ref 6–20)
CO2: 26 mmol/L (ref 22–32)
Calcium: 8 mg/dL — ABNORMAL LOW (ref 8.9–10.3)
Chloride: 96 mmol/L — ABNORMAL LOW (ref 98–111)
Creatinine, Ser: 1.44 mg/dL — ABNORMAL HIGH (ref 0.61–1.24)
GFR, Estimated: 56 mL/min — ABNORMAL LOW (ref >60)
Glucose, Bld: 139 mg/dL — ABNORMAL HIGH (ref 70–99)
Potassium: 3.9 mmol/L (ref 3.5–5.1)
Sodium: 127 mmol/L — ABNORMAL LOW (ref 135–145)

## 2020-10-27 LAB — CBC
HCT: 30.1 % — ABNORMAL LOW (ref 39.0–52.0)
Hemoglobin: 10 g/dL — ABNORMAL LOW (ref 13.0–17.0)
MCH: 29 pg (ref 26.0–34.0)
MCHC: 33.2 g/dL (ref 30.0–36.0)
MCV: 87.2 fL (ref 80.0–100.0)
Platelets: 189 10*3/uL (ref 150–400)
RBC: 3.45 MIL/uL — ABNORMAL LOW (ref 4.22–5.81)
RDW: 15.9 % — ABNORMAL HIGH (ref 11.5–15.5)
WBC: 4.2 10*3/uL (ref 4.0–10.5)
nRBC: 0 % (ref 0.0–0.2)

## 2020-10-27 MED ORDER — OXYCODONE-ACETAMINOPHEN 5-325 MG PO TABS
1.0000 | ORAL_TABLET | Freq: Once | ORAL | Status: AC
  Administered 2020-10-27: 1 via ORAL
  Filled 2020-10-27: qty 1

## 2020-10-27 NOTE — Discharge Summary (Signed)
Physician Discharge Summary  Brad [name] JSH:[id number] DOB: [birth year] DOA: 10/24/2020  PCP: Charlott Rakes, MD  Admit date: 10/24/2020 Discharge date: 10/27/2020  Admitted From: Home Disposition: Home  Recommendations for Outpatient Follow-up:  1. Follow up with PCP in 1-2 weeks Follow-up with cardiology in 1 to 2 weeks as scheduled Home Health: None Equipment/Devices: None  Discharge Condition: Stable CODE STATUS: Full Diet recommendation: Low-salt cardiac diabetic diet  Brief/Interim Summary: Brad [name] a 59 y.o.malewithhistory of alcohol abuse and cocaine abuse with history of chronic combined CHF, diabetes mellitus who was just discharged about 4 days ago after being admitted for CHF presents to the ER with complaints of increasing abdominal discomfort nausea vomiting and shortness of breath or chest pain. Patient states he has been compliant with his medication as advised. Admits to drinking alcohol every day. Denies any blood in the vomitus and also has been some diarrhea which was watery. In ED - given the significant pain patient underwent CT angiogram of the chest abdomen pelvis to rule out dissection. 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 In addition it showed ascites with narrowing of the IMA renal artery and the splanchnic vessels. Patient was started on Lasix empiric antibiotics and admitted for further management. Labs are significant for hemoglobin of 12.4 sodium was 122. EKG showing A. fib rate controlled. BNP was 834 Covid test was negative.  Patient admitted as above, well-known to the hospital service for recurrent combined heart failure exacerbation and volume overload.  Patient again presents with similar episode, found to be in volume overload diuresed quite well with increased IV diuretics and is now otherwise stable and agreeable for discharge home.  Patient  had multiple complaints of generalized pain both abdominal neck shoulder arm and legs, he noted these were chronic in nature as such we discussed that he would not be eligible to receive any narcotics here given longstanding history and that he will need to follow with PCP for further evaluation or imaging as indicated.  Patient otherwise stable and otherwise agreeable for discharge given resolution of acute heart failure exacerbation as above off oxygen without dyspnea with exertion and otherwise back to baseline.  Of note we had a lengthy discussion about his intolerance to medications including ACE inhibitor's due to allergy but we are unable to provide beta-blockade at this point given patient's ongoing use and abuse of cocaine.  We discussed that if he continues to be medically noncompliant with lifestyle, prescription medications as well as illicit substances it would only increases risk of morbidity mortality over the next few months to years.  Discharge Diagnoses:  Active Problems:   Polysubstance abuse (HCC)   DM type 2 (diabetes mellitus, type 2) (HCC)   CKD (chronic kidney disease) stage 3, GFR 30-59 ml/min (HCC)   CAD (coronary artery disease), native coronary artery   Permanent atrial fibrillation   Acute exacerbation of CHF (congestive heart failure) (HCC)   Hyponatremia with normal extracellular fluid volume   Acute CHF (congestive heart failure) (Brad [name])   Osteomyelitis of sternum (HCC)    Discharge Instructions  Discharge Instructions    Avoid straining   Complete by: As directed    Call MD for:  difficulty breathing, headache or visual disturbances   Complete by: As directed    Call MD for:  extreme fatigue   Complete by: As directed    Call MD for:  persistant dizziness or light-headedness   Complete  by: As directed    Diet - low sodium heart healthy   Complete by: As directed    Heart Failure patients record your daily weight using the same scale at the same time of day    Complete by: As directed    Increase activity slowly   Complete by: As directed    STOP any activity that causes chest pain, shortness of breath, dizziness, sweating, or exessive weakness   Complete by: As directed      Allergies as of 10/27/2020      Reactions   Angiotensin Receptor Blockers Anaphylaxis, Other (See Comments)   (Angioedema also with Lisinopril, therefore ARB's are contraindicated)   Lisinopril Anaphylaxis, Swelling   Throat swelling   Pamelor [nortriptyline Hcl] Anaphylaxis, Swelling   Throat swells      Medication List    TAKE these medications   Accu-Chek Guide Me w/Device Kit Use as instructed daily   Accu-Chek Guide test strip Generic drug: glucose blood Use as instructed daily   amiodarone 200 MG tablet Commonly known as: PACERONE Take 1 tablet (200 mg total) by mouth daily.   atorvastatin 20 MG tablet Commonly known as: LIPITOR Take 1 tablet (20 mg total) by mouth daily at 6 PM.   dapagliflozin propanediol 10 MG Tabs tablet Commonly known as: FARXIGA Take 1 tablet (10 mg total) by mouth daily before breakfast.   DULoxetine 60 MG capsule Commonly known as: Cymbalta Take 1 capsule (60 mg total) by mouth daily.   folic acid 1 MG tablet Commonly known as: FOLVITE Take 1 mg by mouth daily.   furosemide 40 MG tablet Commonly known as: LASIX Take 1 tablet (40 mg total) by mouth 2 (two) times daily.   gabapentin 300 MG capsule Commonly known as: NEURONTIN 2 caps in the morning and 3 caps in the evening What changed:   how much to take  how to take this  when to take this  additional instructions   isosorbide-hydrALAZINE 20-37.5 MG tablet Commonly known as: BIDIL Take 1 tablet by mouth 3 (three) times daily.   ondansetron 4 MG tablet Commonly known as: ZOFRAN Take 1 tablet (4 mg total) by mouth every 8 (eight) hours as needed for nausea or vomiting.   pantoprazole 40 MG tablet Commonly known as: Protonix Take 1 tablet (40 mg  total) by mouth daily.   potassium chloride SA 20 MEQ tablet Commonly known as: KLOR-CON Take 1 tablet (20 mEq total) by mouth daily.   sodium chloride 1 g tablet Take 2 tablets (2 g total) by mouth 3 (three) times daily with meals.   thiamine 100 MG tablet Take 1 tablet (100 mg total) by mouth daily.       Allergies  Allergen Reactions  . Angiotensin Receptor Blockers Anaphylaxis and Other (See Comments)    (Angioedema also with Lisinopril, therefore ARB's are contraindicated)  . Lisinopril Anaphylaxis and Swelling    Throat swelling  . Pamelor [Nortriptyline Hcl] Anaphylaxis and Swelling    Throat swells    Consultations: None  Procedures/Studies: CT ABDOMEN PELVIS WO CONTRAST  Result Date: 10/18/2020 CLINICAL DATA:  Diarrhea and diffuse abdominal pain EXAM: CT ABDOMEN AND PELVIS WITHOUT CONTRAST TECHNIQUE: Multidetector CT imaging of the abdomen and pelvis was performed following the standard protocol without IV contrast. COMPARISON:  Ten days ago FINDINGS: Lower chest: Moderate right pleural effusion and small left pleural effusion, increased. Mild associated atelectasis. Chronic cardiomegaly. Hepatobiliary: Cirrhotic liver morphology. No gross mass lesion.Dependent high-density gallbladder contents without  calcified stone or acute inflammation. Pancreas: Generalized atrophy. Spleen: Unremarkable. Adrenals/Urinary Tract: Negative adrenals. No hydronephrosis or stone. Unremarkable bladder for the degree of distension. Stomach/Bowel: No obstruction. No appendicitis or other visible inflammation. Vascular/Lymphatic: No acute vascular abnormality. Diffuse atheromatous calcification. No mass or adenopathy. Reproductive:Chronic prostate enlargement Other: No ascites or pneumoperitoneum.  Anasarca Musculoskeletal: No acute abnormalities. IMPRESSION: 1. Moderate right and small left pleural effusion which has increased from 10 days ago. There is also progressive anasarca. 2. Cirrhosis.  Electronically Signed   By: Monte Fantasia M.D.   On: 10/18/2020 07:24   DG Chest 2 View  Result Date: 10/24/2020 CLINICAL DATA:  Chest pain EXAM: CHEST - 2 VIEW COMPARISON:  10/08/2020 FINDINGS: Cardiomegaly, vascular congestion. Bibasilar atelectasis. No effusions or overt edema. No acute bony abnormality. IMPRESSION: Cardiomegaly with vascular congestion. Bibasilar atelectasis. Electronically Signed   By: Rolm Baptise M.D.   On: 10/24/2020 19:16   DG Cervical Spine 2 or 3 views  Result Date: 10/26/2020 CLINICAL DATA:  Fall with neck pain. EXAM: CERVICAL SPINE - 2-3 VIEW COMPARISON:  Radiograph 09/04/2020 FINDINGS: Anterior C4-C6 fusion with interbody spacers. The hardware is intact. There is no evidence of fracture. Normal alignment. Minimal C2-C3 disc space narrowing. C6-C7 disc space is not well assessed despite presence of a swimmer's view. Lateral masses of C1 are well aligned on C2. There is no prevertebral soft tissue edema. Carotid calcifications. Soft tissue planes are non suspicious. IMPRESSION: 1. No acute fracture or subluxation of the cervical spine. 2. Anterior C4-C6 fusion without evidence of hardware complication. Electronically Signed   By: Keith Rake M.D.   On: 10/26/2020 22:11   MR CERVICAL SPINE WO CONTRAST  Result Date: 10/08/2020 EXAM: MRI CERVICAL SPINE WITHOUT CONTRAST TECHNIQUE: Multiplanar, multisequence MR imaging of the cervical spine was performed. No intravenous contrast was administered. COMPARISON:  Cervical radiographs 09/04/2020, MRI 07/10/2020 FINDINGS: Severely limited examination secondary to patient intolerance and motion. Within this limitation: Alignment: Mild straightening of the normal cervical lordosis. Otherwise, no evidence of substantial subluxation. Vertebrae: Limited evaluation without obvious marrow signal abnormality. Vertebral body heights are maintained. Postsurgical changes of C4 through C6 ACDF. Cord: No obvious cord signal abnormality;  however, areas of previously seen cord signal abnormality at C4-C5 and C5-C6 are poorly characterized given motion. Posterior Fossa, vertebral arteries, paraspinal tissues: Nondiagnostic evaluation of the paraspinal tissues. The visualized fossa is unremarkable. Disc levels: Mild to moderate stenosis is suspected C4-C5 and C5-C6. Otherwise, no evidence of significant canal stenosis. Nondiagnostic evaluation of the foramina secondary to motion. IMPRESSION: Severely limited examination secondary to patient intolerance and motion. Postsurgical changes of C4 through C6 ACDF. Mild to moderate stenosis is suspected at C4-C5 and C5-C6, improved from prior. Nondiagnostic evaluation of the foramina. Electronically Signed   By: Margaretha Sheffield MD   On: 10/08/2020 07:49   DG CHEST PORT 1 VIEW  Result Date: 10/08/2020 CLINICAL DATA:  Shortness of breath. EXAM: PORTABLE CHEST 1 VIEW COMPARISON:  October 05, 2020. FINDINGS: Stable cardiomegaly. No pneumothorax or pleural effusion is noted. Lungs are clear. Bony thorax is unremarkable. IMPRESSION: No active disease. Electronically Signed   By: Marijo Conception M.D.   On: 10/08/2020 08:48   DG Chest Portable 1 View  Result Date: 10/05/2020 CLINICAL DATA:  Shortness of breath. EXAM: PORTABLE CHEST 1 VIEW COMPARISON:  September 18, 2020. FINDINGS: Stable cardiomegaly. No pneumothorax or pleural effusion is noted. Lungs are clear. Bony thorax is unremarkable. IMPRESSION: No active disease. Electronically Signed  By: Marijo Conception M.D.   On: 10/05/2020 08:27   DG Abdomen Acute W/Chest  Result Date: 10/17/2020 CLINICAL DATA:  Shortness of breath and abdominal EXAM: DG ABDOMEN ACUTE WITH 1 VIEW CHEST COMPARISON:  None. FINDINGS: There is no evidence of dilated bowel loops or free intraperitoneal air. No radiopaque calculi or other significant radiographic abnormality is seen. There is mild cardiomegaly. Aortic knob calcifications are seen. Patchy airspace opacity is  seen at the right lung base. IMPRESSION: Negative abdominal radiographs. Patchy airspace opacities seen at the right lung base which could be from atelectasis and/or infectious etiology. Electronically Signed   By: Prudencio Pair M.D.   On: 10/17/2020 21:46   CT Renal Stone Study  Result Date: 10/08/2020 CLINICAL DATA:  59 year old male with abdominal and flank pain. EXAM: CT ABDOMEN AND PELVIS WITHOUT CONTRAST TECHNIQUE: Multidetector CT imaging of the abdomen and pelvis was performed following the standard protocol without IV contrast. COMPARISON:  CTA Chest, Abdomen, and Pelvis 06/21/2020. FINDINGS: Lower chest: Stable cardiomegaly since July. Small layering pleural effusions, that on the right is slightly larger. Negative for lung base pneumonia. Hepatobiliary: Nodular, cirrhotic liver. No discrete liver lesion in the absence of contrast. Negative gallbladder. Pancreas: Negative. Spleen: Diminutive and negative. Adrenals/Urinary Tract: Stable mild adrenal gland thickening. Negative noncontrast kidneys. Circumferential thickening of the urinary bladder (series 2, image 72) appears stable since July. No urinary calculus. Both ureters are decompressed and normal to the bladder. Stomach/Bowel: Redundant large bowel. No dilated large or small bowel loops. Normal appendix (coronal image 74). Negative terminal ileum. Trace free fluid in both gutters. No free air. Negative stomach. Decompressed duodenum. No convincing bowel inflammation. Vascular/Lymphatic: Aortoiliac calcified atherosclerosis. Vascular patency is not evaluated in the absence of IV contrast. No lymphadenopathy. Reproductive: Small volume of inguinal ligament fluid suspected, otherwise negative. Other: No pelvic free fluid. Musculoskeletal: No acute osseous abnormality identified. IMPRESSION: 1. Cirrhosis with trace ascites and small pleural effusions. Right pleural effusion mildly increased since July. 2. Chronic bladder wall thickening.  Cystitis,  UTI not excluded. 3. No urinary calculus or obstructive uropathy. No other acute or inflammatory process in the non-contrast abdomen or pelvis. 4. Cardiomegaly.  Aortic Atherosclerosis (ICD10-I70.0). Electronically Signed   By: Genevie Ann M.D.   On: 10/08/2020 03:35   CT Angio Chest/Abd/Pel for Dissection W and/or W/WO  Addendum Date: 10/24/2020   ADDENDUM REPORT: 10/24/2020 23:40 ADDENDUM: Results and case discussion with the ordering provider via telephone on 10/24/2020 at 11:40 pm to provider Sherol Dade , who verbally acknowledged these results. Electronically Signed   By: Lovena Le M.D.   On: 10/24/2020 23:40   Result Date: 10/24/2020 CLINICAL DATA:  Chest and abdominal pain, concern for dissection, history of substance abuse, advanced cardiac history, CKD and hepatitis C EXAM: CT ANGIOGRAPHY CHEST, ABDOMEN AND PELVIS TECHNIQUE: Non-contrast CT of the chest was initially obtained. Multidetector CT imaging through the chest, abdomen and pelvis was performed using the standard protocol during bolus administration of intravenous contrast. Multiplanar reconstructed images and MIPs were obtained and reviewed to evaluate the vascular anatomy. CONTRAST:  119m OMNIPAQUE IOHEXOL 350 MG/ML SOLN COMPARISON:  CT angiography 06/21/2020, chest radiograph 10/24/2020, CT abdomen and pelvis 10/18/2020 FINDINGS: CTA CHEST FINDINGS Cardiovascular: Noncontrast CT of the chest was performed initially. There is a normal caliber aorta with scattered atherosclerotic calcifications. No abnormal hyperdense mural thickening or plaque displacement is seen to suggest intramural hematoma. Postcontrast administration, there is satisfactory preferential opacification of the thoracic aorta. No acute  luminal abnormality of the imaged aorta. No periaortic stranding or hemorrhage. Normal 3 vessel branching of the aortic arch. Proximal great vessels are mildly calcified but otherwise unremarkable without acute luminal abnormality.  Central pulmonary arteries are normal caliber. No large central or lobar filling defects present within the limitations this non tailored evaluation of the pulmonary arteries. There is mild cardiomegaly with predominantly biatrial enlargement and three-vessel coronary artery atherosclerosis. No pericardial effusion. Small volume of fluid in the pericardial recesses is upper limits of physiologic normal. Mediastinum/Nodes: Minimal retrosternal thickening along the left posterolateral margin of the sternomanubrial joint. No other mediastinal fluid or gas. Normal thyroid gland and thoracic inlet. No acute abnormality of the trachea or esophagus. No worrisome mediastinal, hilar or axillary adenopathy. Scattered subcentimeter low-attenuation mediastinal nodes may be edematous or reactive. Lungs/Pleura: Redistribution of pulmonary vascularity with vascular cuffing and interlobular septal thickening most pronounced towards the lung apices and bilateral bases suggesting at least mild interstitial edema. Moderate right and small left pleural effusions with adjacent areas of passive atelectasis. Additional areas of bandlike opacity likely reflecting further subsegmental atelectasis or scarring. Some more dependent ground-glass favors additional atelectasis as well. No focal consolidative opacity. No pneumothorax Musculoskeletal: Mild dextrocurvature of the spine, possibly positional. There is fairly focal phlegmonous change along the anterior chest wall near the sternomanubrial margin albeit without organized rim enhancing collection or abscess nor adjacent erosive or destructive changes of the sternum joint at this time. Some multilevel discogenic changes are present without features of acute osteomyelitis or discitis. Some asymmetric degenerative changes of the medial head clavicles are similar to prior. Additional mild degenerative changes in both shoulders as imaged. Review of the MIP images confirms the above findings.  CTA ABDOMEN AND PELVIS FINDINGS VASCULAR Aorta: Atherosclerotic plaque within the normal caliber aorta. No aneurysm or ectasia. No acute luminal abnormality. No periaortic stranding or hemorrhage. Celiac: Mild ostial plaque narrowing with some poststenotic dilatation of the proximal celiac to 12 mm in diameter (7/150), unchanged from comparison. No other significant narrowing, dilatation or ectasia is seen. SMA: Mild ostial plaque narrowing without aneurysm, 10 Jia, stenosis or occlusion. No acute luminal abnormality is seen. Renals: Single renal arteries bilaterally. Ostial plaque bilaterally with mild narrowing, right slightly greater than left. No other significant stenosis or occlusion. No acute luminal abnormality. No aneurysm or ectasia or features of fibromuscular dysplasia. IMA: Moderate ostial plaque narrowing. Otherwise normally opacified. Normal branching without acute luminal abnormality or occlusion. Inflow: Atherosclerotic calcification in the common, internal external iliac artery branches without stenosis, occlusion or acute luminal abnormality. Proximal outflow vessels including the common superficial and deep femoral arteries are mildly calcified but without significant stenosis, occlusion or acute luminal abnormality. Veins: Reflux of contrast into the IVC and hepatic veins. No other major venous abnormality within limitations of this arterial phase exam. Review of the MIP images confirms the above findings. NON-VASCULAR Hepatobiliary: Hepatomegaly with a nodular liver surface contour. No concerning focal liver lesion. Gallbladder containing some layering intermediate attenuation may reflect biliary sludge or sand. No pericholecystic fluid or inflammation. No intraductal gallstones. No biliary ductal dilatation. Pancreas: No pancreatic ductal dilatation or surrounding inflammatory changes. Spleen: Normal in size. No concerning splenic lesions. Adrenals/Urinary Tract: Normal adrenal glands. Stable  mild symmetric bilateral perinephric stranding, a nonspecific finding particularly given some circumferential bladder wall thickening. Fluid attenuation cyst present in the lower pole left kidney is similar to comparison. No concerning renal mass. Urolithiasis or hydronephrosis. No visible bladder calculi or debris. Stomach/Bowel: Distal esophagus  and stomach are unremarkable. There are some mild edematous retroperitoneal changes centered upon the second and third portions of the duodenal sweep as across the midline abdomen. Wall thickening is difficult to assess given underdistention and lack of enteric contrast media. No other small bowel thickening or stranding is seen. A normal appendix is visualized. No colonic dilatation or wall thickening. Rectal stool ball without significant wall thickening. Presacral fat stranding is nonspecific given additional edematous features elsewhere. Lymphatic: No suspicious or enlarged lymph nodes in the included lymphatic chains. Reproductive: Prostatomegaly. Prostate seminal vesicles are otherwise unremarkable. Other: There is diffuse body wall edema. There are nonspecific retroperitoneal edematous changes, which is questionably centered upon the duodenal sweep though is distributed within the pararenal spaces and more inferiorly as well. Small volume of free intraperitoneal fluid is also present layering within the pericolic gutters and presacral space as well. No free air. No bowel containing hernias. Musculoskeletal: No acute osseous abnormality or suspicious osseous lesion. Multilevel degenerative changes are present in the imaged portions of the spine. Mild degenerative changes in the hips and pelvis. Review of the MIP images confirms the above findings. IMPRESSION: Vascular: 1. No evidence of acute aortic syndrome. 2. Features of congestive heart failure with cardiomegaly and predominantly biatrial enlargement and three-vessel coronary artery atherosclerosis. Reflux of  contrast into the IVC and hepatic veins may suggest right-sided heart elevated right heart pressures in the setting of fluid overload. 3.  Aortic Atherosclerosis (ICD10-I70.0). 4. Multilevel mild splanchnic and renal artery ostial narrowing with more mild narrowing at the IMA origin as well. 5. 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 Nonvascular: 1. Stigmata of cirrhosis with a heterogeneous, nodular liver. 2. Additional features of anasarca with diffuse body wall edema, small volume ascites, and mesenteric edematous changes. 3. Fairly focal phlegmonous change along the anterior chest wall near the sternomanubrial margin with mild retrosternal stranding as well. No organized rim enhancing collection or abscess nor destructive changes of the sternomanubrial joint at this time. Findings are nonspecific though could reflect early infection/sternomanubrial osteomyelitis particularly setting of substance use. 4. Slightly more focal retroperitoneal edematous changes appear centered upon the duodenal sweep though may be redistributed. Could correlate for clinical features of duodenitis. 5. Circumferential bladder wall thickening with prostatomegaly, possibly related to chronic outlet obstruction. Recommend assessment of urinary symptoms with urinalysis as clinically warranted to exclude a cystitis. 6. Gallbladder containing some layering intermediate attenuation may reflect biliary sludge or sand but without convincing CT features of acute cholecystitis. Electronically Signed: By: Lovena Le M.D. On: 10/24/2020 23:26     Subjective: Overnight patient had a mechanical fall, was using his rolling table in the hospital room for support at which time the table rolled out from under him when he fell, he had no deficits, decreased range of motion but did complain of neck pain diffusely overnight, imaging was unremarkable and patient did receive one-time dose of Percocet which he continued to ask for  throughout the morning.   Discharge Exam: Vitals:   10/27/20 1059 10/27/20 1528  BP: 123/83 124/84  Pulse: 74 75  Resp: 18 20  Temp: 97.9 F (36.6 C) 97.8 F (36.6 C)  SpO2: 95% 94%   Vitals:   10/27/20 0442 10/27/20 0751 10/27/20 1059 10/27/20 1528  BP:  121/81 123/83 124/84  Pulse:  80 74 75  Resp:  _0 Temp:  98.7 F (37.1 C) 97.9 F (36.6 C) 97.8 F (36.6 C)  TempSrc:  Oral Oral Oral  SpO2:  96% 95% 94%  Weight: 84.4 kg     Height:        General: Pt is alert, awake, not in acute distress Cardiovascular: RRR, S1/S2 +, no rubs, no gallops Respiratory: CTA bilaterally, no wheezing, no rhonchi Abdominal: Soft, NT, ND, bowel sounds + Extremities: no edema, no cyanosis    The results of significant diagnostics from this hospitalization (including imaging, microbiology, ancillary and laboratory) are listed below for reference.     Microbiology: Recent Results (from the past 240 hour(s))  Resp Panel by RT PCR (RSV, Flu A&B, Covid) - Nasopharyngeal Swab     Status: None   Collection Time: 10/17/20  8:26 PM   Specimen: Nasopharyngeal Swab  Result Value Ref Range Status   SARS Coronavirus 2 by RT PCR NEGATIVE NEGATIVE Final    Comment: (NOTE) SARS-CoV-2 target nucleic acids are NOT DETECTED.  The SARS-CoV-2 RNA is generally detectable in upper respiratoy specimens during the acute phase of infection. The lowest concentration of SARS-CoV-2 viral copies this assay can detect is 131 copies/mL. A negative result does not preclude SARS-Cov-2 infection and should not be used as the sole basis for treatment or other patient management decisions. A negative result may occur with  improper specimen collection/handling, submission of specimen other than nasopharyngeal swab, presence of viral mutation(s) within the areas targeted by this assay, and inadequate number of viral copies (<131 copies/mL). A negative result must be combined with clinical observations,  patient history, and epidemiological information. The expected result is Negative.  Fact Sheet for Patients:  PinkCheek.be  Fact Sheet for Healthcare Providers:  GravelBags.it  This test is no t yet approved or cleared by the [place] FDA and  has been authorized for detection and/or diagnosis of SARS-CoV-2 by FDA under an Emergency Use Authorization (EUA). This EUA will remain  in effect (meaning this test can be used) for the duration of the COVID-19 declaration under Section 564(b)(1) of the Act, 21 U.S.C. section 360bbb-3(b)(1), unless the authorization is terminated or revoked sooner.     Influenza A by PCR NEGATIVE NEGATIVE Final   Influenza B by PCR NEGATIVE NEGATIVE Final    Comment: (NOTE) The Xpert Xpress SARS-CoV-2/FLU/RSV assay is intended as an aid in  the diagnosis of influenza from Nasopharyngeal swab specimens and  should not be used as a sole basis for treatment. Nasal washings and  aspirates are unacceptable for Xpert Xpress SARS-CoV-2/FLU/RSV  testing.  Fact Sheet for Patients: PinkCheek.be  Fact Sheet for Healthcare Providers: GravelBags.it  This test is not yet approved or cleared by the [place] FDA and  has been authorized for detection and/or diagnosis of SARS-CoV-2 by  FDA under an Emergency Use Authorization (EUA). This EUA will remain  in effect (meaning this test can be used) for the duration of the  Covid-19 declaration under Section 564(b)(1) of the Act, 21  U.S.C. section 360bbb-3(b)(1), unless the authorization is  terminated or revoked.    Respiratory Syncytial Virus by PCR NEGATIVE NEGATIVE Final    Comment: (NOTE) Fact Sheet for Patients: PinkCheek.be  Fact Sheet for Healthcare Providers: GravelBags.it  This test is not yet approved or cleared by the  [place] FDA and  has been authorized for detection and/or diagnosis of SARS-CoV-2 by  FDA under an Emergency Use Authorization (EUA). This EUA will remain  in effect (meaning this test can be used) for the duration of the  COVID-19 declaration under Section 564(b)(1) of the Act, 21 U.S.C.  section 360bbb-3(b)(1), unless the authorization is terminated or  revoked. Performed at [facility], [place] [address] [place], [place] [address]   Resp Panel by RT PCR (RSV, Flu A&B, Covid) - Nasopharyngeal Swab     Status: None   Collection Time: 10/25/20 12:10 AM   Specimen: Nasopharyngeal Swab  Result Value Ref Range Status   SARS Coronavirus 2 by RT PCR NEGATIVE NEGATIVE Final    Comment: (NOTE) SARS-CoV-2 target nucleic acids are NOT DETECTED.  The SARS-CoV-2 RNA is generally detectable in upper respiratoy specimens during the acute phase of infection. The lowest concentration of SARS-CoV-2 viral copies this assay can detect is 131 copies/mL. A negative result does not preclude SARS-Cov-2 infection and should not be used as the sole basis for treatment or other patient management decisions. A negative result may occur with  improper specimen collection/handling, submission of specimen other than nasopharyngeal swab, presence of viral mutation(s) within the areas targeted by this assay, and inadequate number of viral copies (<131 copies/mL). A negative result must be combined with clinical observations, patient history, and epidemiological information. The expected result is Negative.  Fact Sheet for Patients:  PinkCheek.be  Fact Sheet for Healthcare Providers:  GravelBags.it  This test is no t yet approved or cleared by the [place] FDA and  has been authorized for detection and/or diagnosis of SARS-CoV-2 by FDA under an Emergency Use Authorization (EUA). This EUA will remain  in effect (meaning this test can be  used) for the duration of the COVID-19 declaration under Section 564(b)(1) of the Act, 21 U.S.C. section 360bbb-3(b)(1), unless the authorization is terminated or revoked sooner.     Influenza A by PCR NEGATIVE NEGATIVE Final   Influenza B by PCR NEGATIVE NEGATIVE Final    Comment: (NOTE) The Xpert Xpress SARS-CoV-2/FLU/RSV assay is intended as an aid in  the diagnosis of influenza from Nasopharyngeal swab specimens and  should not be used as a sole basis for treatment. Nasal washings and  aspirates are unacceptable for Xpert Xpress SARS-CoV-2/FLU/RSV  testing.  Fact Sheet for Patients: PinkCheek.be  Fact Sheet for Healthcare Providers: GravelBags.it  This test is not yet approved or cleared by the [place] FDA and  has been authorized for detection and/or diagnosis of SARS-CoV-2 by  FDA under an Emergency Use Authorization (EUA). This EUA will remain  in effect (meaning this test can be used) for the duration of the  Covid-19 declaration under Section 564(b)(1) of the Act, 21  U.S.C. section 360bbb-3(b)(1), unless the authorization is  terminated or revoked.    Respiratory Syncytial Virus by PCR NEGATIVE NEGATIVE Final    Comment: (NOTE) Fact Sheet for Patients: PinkCheek.be  Fact Sheet for Healthcare Providers: GravelBags.it  This test is not yet approved or cleared by the [place] FDA and  has been authorized for detection and/or diagnosis of SARS-CoV-2 by  FDA under an Emergency Use Authorization (EUA). This EUA will remain  in effect (meaning this test can be used) for the duration of the  COVID-19 declaration under Section 564(b)(1) of the Act, 21 U.S.C.  section 360bbb-3(b)(1), unless the authorization is terminated or  revoked. Performed at [facility], [place] [address] [place], [place] [address]   Culture, blood (routine x 2)      Status: None (Preliminary result)   Collection Time: 10/25/20 12:10 AM   Specimen: BLOOD  Result Value Ref Range Status   Specimen Description BLOOD LEFT ANTECUBITAL  Final   Special Requests  Final    BOTTLES DRAWN AEROBIC AND ANAEROBIC Blood Culture results may not be optimal due to an excessive volume of blood received in culture bottles   Culture   Final    NO GROWTH 2 DAYS Performed at [place] [address], [place], [place] [address]    Report Status PENDING  Incomplete  Culture, blood (routine x 2)     Status: None (Preliminary result)   Collection Time: 10/25/20 12:10 AM   Specimen: BLOOD RIGHT HAND  Result Value Ref Range Status   Specimen Description BLOOD RIGHT HAND  Final   Special Requests   Final    BOTTLES DRAWN AEROBIC AND ANAEROBIC Blood Culture adequate volume   Culture   Final    NO GROWTH 2 DAYS Performed at [facility], [place] [address], [place], [place] [address]    Report Status PENDING  Incomplete     Labs: BNP (last 3 results) Recent Labs    10/17/20 2024 10/22/20 0540 10/24/20 2113  BNP 1,190.0* 975.6* 470.9*   Basic Metabolic Panel: Recent Labs  Lab 10/22/20 0540 10/24/20 1850 10/25/20 0252 10/25/20 2957 10/25/20 1044 10/26/20 0049 10/27/20 0212  NA  --    < > 125* 127* 129* 130* 127*  K  --    < > 4.1 4.1 4.0 4.1 3.9  CL  --    < > 92* 92* 95* 97* 96*  CO2  --    < > _0 GLUCOSE  --    < > 114* 109* 95 117* 139*  BUN  --    < > _1 CREATININE  --    < > 1.22 1.26* 1.38* 1.56* 1.44*  CALCIUM  --    < > 8.2* 8.2* 8.1* 8.1* 8.0*  MG 1.8  --   --   --   --   --   --    < > = values in this interval not displayed.   Liver Function Tests: No results for input(s): AST, ALT, ALKPHOS, BILITOT, PROT, ALBUMIN in the last 168 hours. No results for input(s): LIPASE, AMYLASE in the last 168 hours. No results for input(s): AMMONIA in the last 168 hours. CBC: Recent Labs  Lab 10/21/20 0940  10/22/20 0540 10/24/20 1850 10/26/20 0049 10/27/20 0212  WBC 3.9* 4.2 4.0 4.0 4.2  NEUTROABS 2.5 3.2  --   --   --   HGB 11.4* 10.3* 12.4* 9.7* 10.0*  HCT 34.9* 31.8* 38.4* 29.9* 30.1*  MCV 87.7 89.1 89.3 85.7 87.2  PLT 209 217 275 197 189   Cardiac Enzymes: No results for input(s): CKTOTAL, CKMB, CKMBINDEX, TROPONINI in the last 168 hours. BNP: Invalid input(s): POCBNP CBG: Recent Labs  Lab 10/26/20 1137 10/26/20 1744 10/26/20 2041 10/27/20 0616 10/27/20 1056  GLUCAP 139* 155* 154* 141* 159*   D-Dimer No results for input(s): DDIMER in the last 72 hours. Hgb A1c No results for input(s): HGBA1C in the last 72 hours. Lipid Profile No results for input(s): CHOL, HDL, LDLCALC, TRIG, CHOLHDL, LDLDIRECT in the last 72 hours. Thyroid function studies No results for input(s): TSH, T4TOTAL, T3FREE, THYROIDAB in the last 72 hours.  Invalid input(s): FREET3 Anemia work up No results for input(s): VITAMINB12, FOLATE, FERRITIN, TIBC, IRON, RETICCTPCT in the last 72 hours. Urinalysis    Component Value Date/Time   COLORURINE STRAW (A) 10/24/2020 2116   APPEARANCEUR CLEAR 10/24/2020 2116   LABSPEC 1.001 (  L) 10/24/2020 2116   PHURINE 5.0 10/24/2020 2116   GLUCOSEU >=500 (A) 10/24/2020 2116   HGBUR SMALL (A) 10/24/2020 2116   BILIRUBINUR NEGATIVE 10/24/2020 2116   BILIRUBINUR neg 05/08/2017 1503   KETONESUR NEGATIVE 10/24/2020 2116   PROTEINUR 30 (A) 10/24/2020 2116   UROBILINOGEN 0.2 05/08/2017 1503   UROBILINOGEN 1.0 04/26/2012 1328   NITRITE NEGATIVE 10/24/2020 2116   LEUKOCYTESUR NEGATIVE 10/24/2020 2116   Sepsis Labs Invalid input(s): PROCALCITONIN,  WBC,  LACTICIDVEN Microbiology Recent Results (from the past 240 hour(s))  Resp Panel by RT PCR (RSV, Flu A&B, Covid) - Nasopharyngeal Swab     Status: None   Collection Time: 10/17/20  8:26 PM   Specimen: Nasopharyngeal Swab  Result Value Ref Range Status   SARS Coronavirus 2 by RT PCR NEGATIVE NEGATIVE Final     Comment: (NOTE) SARS-CoV-2 target nucleic acids are NOT DETECTED.  The SARS-CoV-2 RNA is generally detectable in upper respiratoy specimens during the acute phase of infection. The lowest concentration of SARS-CoV-2 viral copies this assay can detect is 131 copies/mL. A negative result does not preclude SARS-Cov-2 infection and should not be used as the sole basis for treatment or other patient management decisions. A negative result may occur with  improper specimen collection/handling, submission of specimen other than nasopharyngeal swab, presence of viral mutation(s) within the areas targeted by this assay, and inadequate number of viral copies (<131 copies/mL). A negative result must be combined with clinical observations, patient history, and epidemiological information. The expected result is Negative.  Fact Sheet for Patients:  PinkCheek.be  Fact Sheet for Healthcare Providers:  GravelBags.it  This test is no t yet approved or cleared by the [place] FDA and  has been authorized for detection and/or diagnosis of SARS-CoV-2 by FDA under an Emergency Use Authorization (EUA). This EUA will remain  in effect (meaning this test can be used) for the duration of the COVID-19 declaration under Section 564(b)(1) of the Act, 21 U.S.C. section 360bbb-3(b)(1), unless the authorization is terminated or revoked sooner.     Influenza A by PCR NEGATIVE NEGATIVE Final   Influenza B by PCR NEGATIVE NEGATIVE Final    Comment: (NOTE) The Xpert Xpress SARS-CoV-2/FLU/RSV assay is intended as an aid in  the diagnosis of influenza from Nasopharyngeal swab specimens and  should not be used as a sole basis for treatment. Nasal washings and  aspirates are unacceptable for Xpert Xpress SARS-CoV-2/FLU/RSV  testing.  Fact Sheet for Patients: PinkCheek.be  Fact Sheet for Healthcare  Providers: GravelBags.it  This test is not yet approved or cleared by the [place] FDA and  has been authorized for detection and/or diagnosis of SARS-CoV-2 by  FDA under an Emergency Use Authorization (EUA). This EUA will remain  in effect (meaning this test can be used) for the duration of the  Covid-19 declaration under Section 564(b)(1) of the Act, 21  U.S.C. section 360bbb-3(b)(1), unless the authorization is  terminated or revoked.    Respiratory Syncytial Virus by PCR NEGATIVE NEGATIVE Final    Comment: (NOTE) Fact Sheet for Patients: PinkCheek.be  Fact Sheet for Healthcare Providers: GravelBags.it  This test is not yet approved or cleared by the [place] FDA and  has been authorized for detection and/or diagnosis of SARS-CoV-2 by  FDA under an Emergency Use Authorization (EUA). This EUA will remain  in effect (meaning this test can be used) for the duration of the  COVID-19 declaration under Section 564(b)(1) of the Act, 21 U.S.C.  section 360bbb-3(b)(1), unless the authorization is terminated or  revoked. Performed at [facility], [place] [address] [place], [place] [address]   Resp Panel by RT PCR (RSV, Flu A&B, Covid) - Nasopharyngeal Swab     Status: None   Collection Time: 10/25/20 12:10 AM   Specimen: Nasopharyngeal Swab  Result Value Ref Range Status   SARS Coronavirus 2 by RT PCR NEGATIVE NEGATIVE Final    Comment: (NOTE) SARS-CoV-2 target nucleic acids are NOT DETECTED.  The SARS-CoV-2 RNA is generally detectable in upper respiratoy specimens during the acute phase of infection. The lowest concentration of SARS-CoV-2 viral copies this assay can detect is 131 copies/mL. A negative result does not preclude SARS-Cov-2 infection and should not be used as the sole basis for treatment or other patient management decisions. A negative result may occur with  improper  specimen collection/handling, submission of specimen other than nasopharyngeal swab, presence of viral mutation(s) within the areas targeted by this assay, and inadequate number of viral copies (<131 copies/mL). A negative result must be combined with clinical observations, patient history, and epidemiological information. The expected result is Negative.  Fact Sheet for Patients:  PinkCheek.be  Fact Sheet for Healthcare Providers:  GravelBags.it  This test is no t yet approved or cleared by the [place] FDA and  has been authorized for detection and/or diagnosis of SARS-CoV-2 by FDA under an Emergency Use Authorization (EUA). This EUA will remain  in effect (meaning this test can be used) for the duration of the COVID-19 declaration under Section 564(b)(1) of the Act, 21 U.S.C. section 360bbb-3(b)(1), unless the authorization is terminated or revoked sooner.     Influenza A by PCR NEGATIVE NEGATIVE Final   Influenza B by PCR NEGATIVE NEGATIVE Final    Comment: (NOTE) The Xpert Xpress SARS-CoV-2/FLU/RSV assay is intended as an aid in  the diagnosis of influenza from Nasopharyngeal swab specimens and  should not be used as a sole basis for treatment. Nasal washings and  aspirates are unacceptable for Xpert Xpress SARS-CoV-2/FLU/RSV  testing.  Fact Sheet for Patients: PinkCheek.be  Fact Sheet for Healthcare Providers: GravelBags.it  This test is not yet approved or cleared by the [place] FDA and  has been authorized for detection and/or diagnosis of SARS-CoV-2 by  FDA under an Emergency Use Authorization (EUA). This EUA will remain  in effect (meaning this test can be used) for the duration of the  Covid-19 declaration under Section 564(b)(1) of the Act, 21  U.S.C. section 360bbb-3(b)(1), unless the authorization is  terminated or revoked.     Respiratory Syncytial Virus by PCR NEGATIVE NEGATIVE Final    Comment: (NOTE) Fact Sheet for Patients: PinkCheek.be  Fact Sheet for Healthcare Providers: GravelBags.it  This test is not yet approved or cleared by the [place] FDA and  has been authorized for detection and/or diagnosis of SARS-CoV-2 by  FDA under an Emergency Use Authorization (EUA). This EUA will remain  in effect (meaning this test can be used) for the duration of the  COVID-19 declaration under Section 564(b)(1) of the Act, 21 U.S.C.  section 360bbb-3(b)(1), unless the authorization is terminated or  revoked. Performed at [facility], [place] [address], [place],  [address]   Culture, blood (routine x 2)     Status: None (Preliminary result)   Collection Time: 10/25/20 12:10 AM   Specimen: BLOOD  Result Value Ref Range Status   Specimen Description BLOOD LEFT ANTECUBITAL  Final   Special Requests  Final    BOTTLES DRAWN AEROBIC AND ANAEROBIC Blood Culture results may not be optimal due to an excessive volume of blood received in culture bottles   Culture   Final    NO GROWTH 2 DAYS Performed at [place] [address] [place], [place] [address]    Report Status PENDING  Incomplete  Culture, blood (routine x 2)     Status: None (Preliminary result)   Collection Time: 10/25/20 12:10 AM   Specimen: BLOOD RIGHT HAND  Result Value Ref Range Status   Specimen Description BLOOD RIGHT HAND  Final   Special Requests   Final    BOTTLES DRAWN AEROBIC AND ANAEROBIC Blood Culture adequate volume   Culture   Final    NO GROWTH 2 DAYS Performed at [facility], [place] [address] [place], [place] [address]    Report Status PENDING  Incomplete     Time coordinating discharge: Over 30 minutes  SIGNED:   Little Ishikawa, DO [facility] 10/27/2020, 3:40 PM Pager   If 7PM-7AM, please contact  night-coverage [email or web address]

## 2020-10-27 NOTE — Progress Notes (Signed)
Pt discharged today to home with roommate.  Pt's IV removed.  Pt taken off telemetry and CCMD notified.  Pt left with all of their personal belongings.  AVS documentation reviewed with Pt and all questions answered.

## 2020-10-27 NOTE — Progress Notes (Signed)
Patient c/o of neck, back( previous surgery) and arm. patient rates his pain 10-10. One time dose of percocet given along with 1 mg ativan. Will continue to monitor

## 2020-10-29 ENCOUNTER — Telehealth: Payer: Self-pay

## 2020-10-29 NOTE — Telephone Encounter (Signed)
Transition Care Management Unsuccessful Follow-up Telephone Call  Date of discharge and from where:  10/27/2020, [facility]  Attempts:  1st Attempt  Reason for unsuccessful TCM follow-up call:  Unable to reach patient Call placed to # [phone] x2, both times the message stated that the call could not be completed at this time   Patient has appointment with Dr Margarita Rana 11/14/2020

## 2020-10-30 ENCOUNTER — Telehealth: Payer: Self-pay

## 2020-10-30 LAB — CULTURE, BLOOD (ROUTINE X 2)
Culture: NO GROWTH
Culture: NO GROWTH
Special Requests: ADEQUATE

## 2020-10-30 NOTE — Telephone Encounter (Signed)
Transition Care Management Unsuccessful Follow-up Telephone Call  Date of discharge and from where:  10/27/2020, [facility]   Attempts:  2nd Attempt  Reason for unsuccessful TCM follow-up call:  Unable to reach patient - call placed to # [phone], message stated that the call is not able to be completed at this time.   He has an appointment with Dr Margarita Rana 11/14/2020.

## 2020-11-02 ENCOUNTER — Encounter: Payer: Self-pay | Admitting: Internal Medicine

## 2020-11-02 IMAGING — DX DG CHEST 1V PORT
1 series · 1 of 1 positions shown · non-contrast
Comparison: 10/24/2019

CLINICAL DATA: PICC line placement

EXAM:
PORTABLE CHEST 1 VIEW

[chest ap]
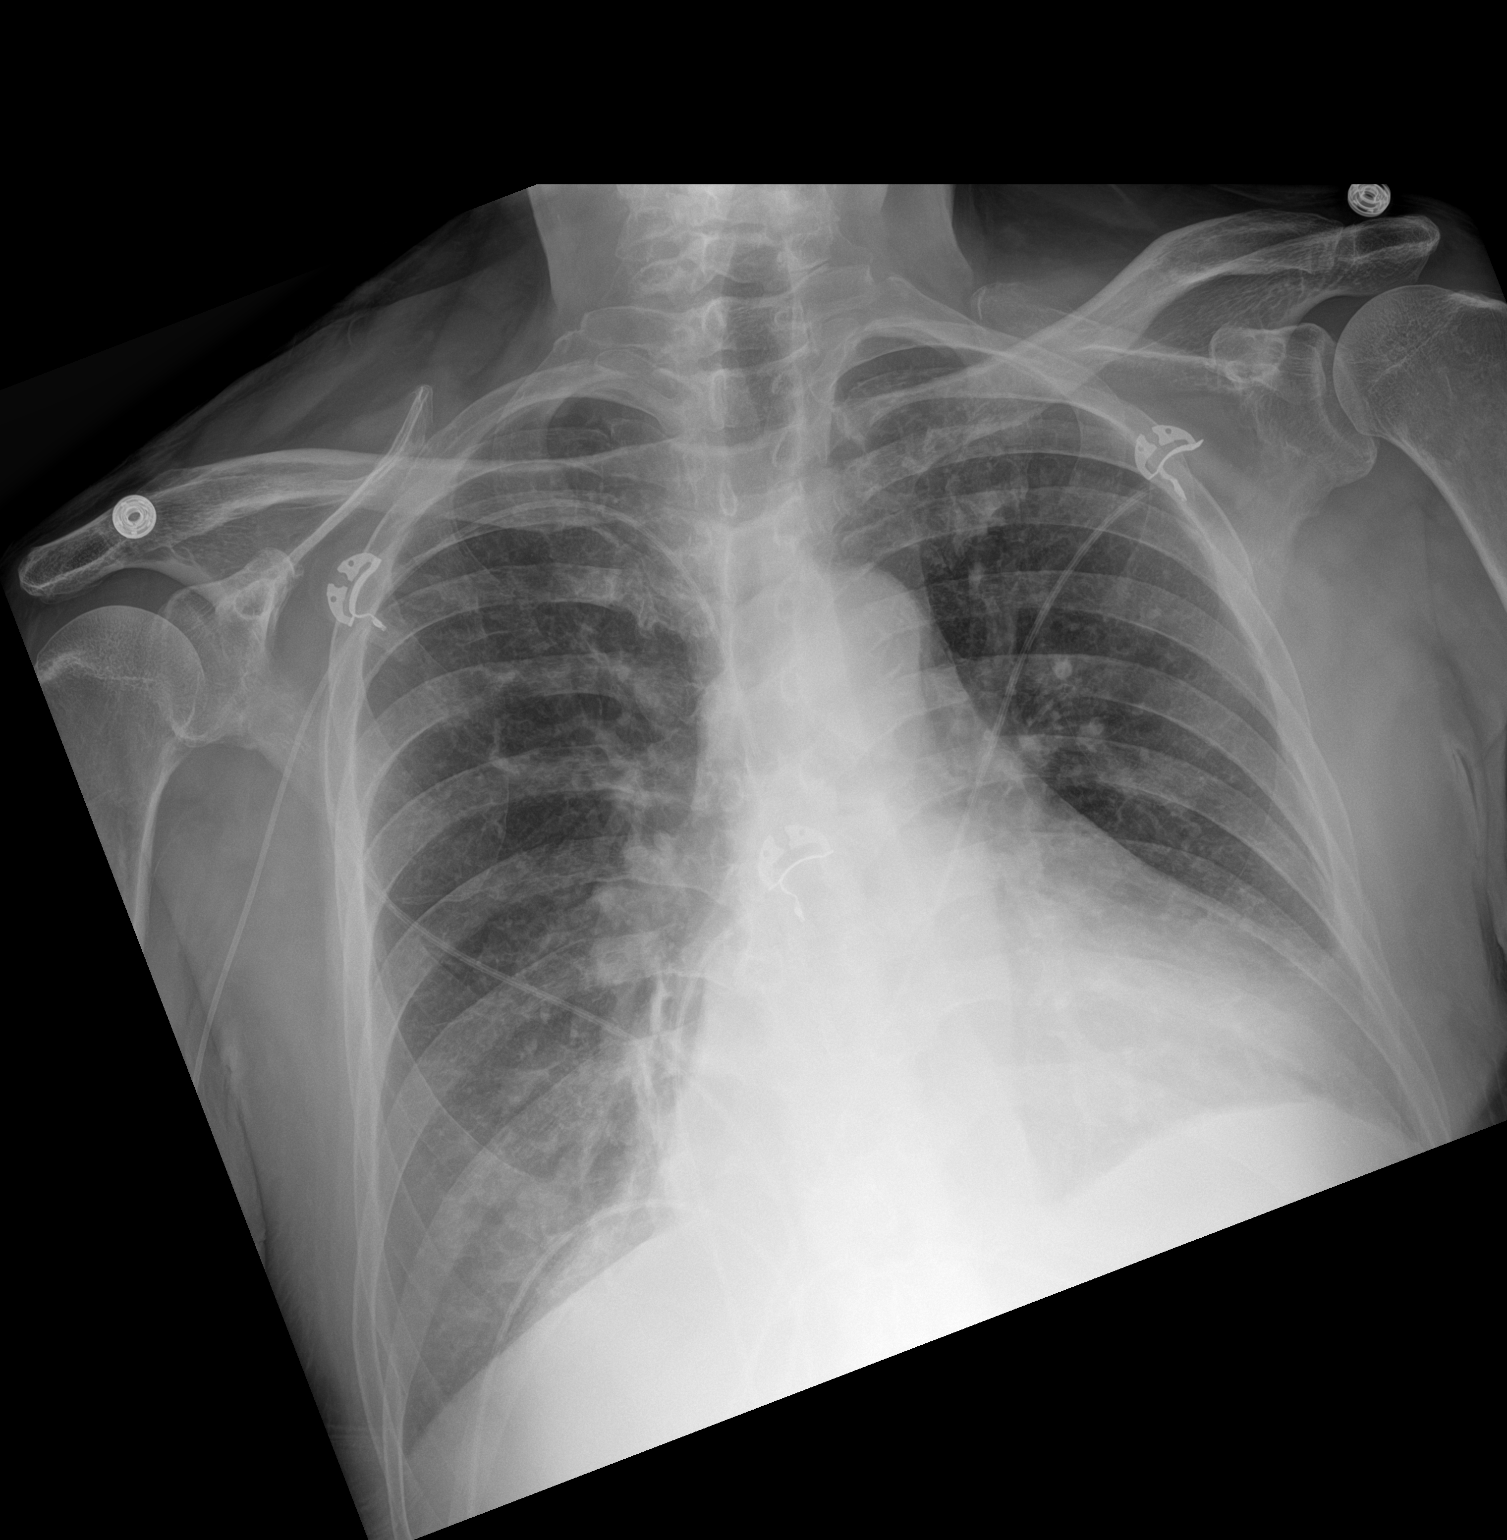

[1 of 1 positions shown; findings below may reference images not displayed]

FINDINGS: Right-sided PICC line tip projects over the lower SVC. No focal
airspace consolidation or pulmonary edema. Unchanged cardiomegaly.
No pleural effusion or pneumothorax.
IMPRESSION: 1. PICC line tip projecting over the lower SVC.
2. Unchanged cardiomegaly. No overt pulmonary edema.

## 2020-11-05 ENCOUNTER — Telehealth (HOSPITAL_COMMUNITY): Payer: Self-pay

## 2020-11-05 NOTE — Telephone Encounter (Signed)
Spoke to Brad [name] who reports he is currently in [facility] and says he is doing well and is taking his medications out of his bubble packs. We reports he will be back in town on Thursday and we can follow up then. I will continue to follow. Call complete.

## 2020-11-07 ENCOUNTER — Emergency Department (HOSPITAL_COMMUNITY): Payer: Medicaid Other

## 2020-11-07 ENCOUNTER — Encounter (HOSPITAL_COMMUNITY): Payer: Self-pay | Admitting: *Deleted

## 2020-11-07 ENCOUNTER — Other Ambulatory Visit: Payer: Self-pay

## 2020-11-07 ENCOUNTER — Inpatient Hospital Stay (HOSPITAL_COMMUNITY)
Admission: EM | Admit: 2020-11-07 | Discharge: 2020-11-13 | DRG: 291 | Disposition: A | Discharge status: Home Health | Payer: Medicaid Other | Attending: Internal Medicine | Admitting: Internal Medicine

## 2020-11-07 DIAGNOSIS — R197 Diarrhea, unspecified: Secondary | ICD-10-CM | POA: Diagnosis not present

## 2020-11-07 DIAGNOSIS — R309 Painful micturition, unspecified: Secondary | ICD-10-CM | POA: Diagnosis present

## 2020-11-07 DIAGNOSIS — Z96652 Presence of left artificial knee joint: Secondary | ICD-10-CM | POA: Diagnosis present

## 2020-11-07 DIAGNOSIS — I340 Nonrheumatic mitral (valve) insufficiency: Secondary | ICD-10-CM | POA: Diagnosis present

## 2020-11-07 DIAGNOSIS — E1122 Type 2 diabetes mellitus with diabetic chronic kidney disease: Secondary | ICD-10-CM | POA: Diagnosis present

## 2020-11-07 DIAGNOSIS — E7800 Pure hypercholesterolemia, unspecified: Secondary | ICD-10-CM | POA: Diagnosis present

## 2020-11-07 DIAGNOSIS — D72819 Decreased white blood cell count, unspecified: Secondary | ICD-10-CM | POA: Diagnosis present

## 2020-11-07 DIAGNOSIS — I4821 Permanent atrial fibrillation: Secondary | ICD-10-CM | POA: Diagnosis present

## 2020-11-07 DIAGNOSIS — I252 Old myocardial infarction: Secondary | ICD-10-CM

## 2020-11-07 DIAGNOSIS — R601 Generalized edema: Secondary | ICD-10-CM

## 2020-11-07 DIAGNOSIS — F1020 Alcohol dependence, uncomplicated: Secondary | ICD-10-CM | POA: Diagnosis present

## 2020-11-07 DIAGNOSIS — R296 Repeated falls: Secondary | ICD-10-CM | POA: Diagnosis present

## 2020-11-07 DIAGNOSIS — D649 Anemia, unspecified: Secondary | ICD-10-CM

## 2020-11-07 DIAGNOSIS — I5022 Chronic systolic (congestive) heart failure: Secondary | ICD-10-CM

## 2020-11-07 DIAGNOSIS — Z981 Arthrodesis status: Secondary | ICD-10-CM | POA: Diagnosis not present

## 2020-11-07 DIAGNOSIS — Z888 Allergy status to other drugs, medicaments and biological substances status: Secondary | ICD-10-CM | POA: Diagnosis not present

## 2020-11-07 DIAGNOSIS — K6289 Other specified diseases of anus and rectum: Secondary | ICD-10-CM | POA: Diagnosis present

## 2020-11-07 DIAGNOSIS — D631 Anemia in chronic kidney disease: Secondary | ICD-10-CM | POA: Diagnosis present

## 2020-11-07 DIAGNOSIS — I130 Hypertensive heart and chronic kidney disease with heart failure and stage 1 through stage 4 chronic kidney disease, or unspecified chronic kidney disease: Principal | ICD-10-CM | POA: Diagnosis present

## 2020-11-07 DIAGNOSIS — N171 Acute kidney failure with acute cortical necrosis: Secondary | ICD-10-CM

## 2020-11-07 DIAGNOSIS — I428 Other cardiomyopathies: Secondary | ICD-10-CM | POA: Diagnosis present

## 2020-11-07 DIAGNOSIS — M1990 Unspecified osteoarthritis, unspecified site: Secondary | ICD-10-CM | POA: Diagnosis present

## 2020-11-07 DIAGNOSIS — I4892 Unspecified atrial flutter: Secondary | ICD-10-CM | POA: Diagnosis present

## 2020-11-07 DIAGNOSIS — Z9181 History of falling: Secondary | ICD-10-CM

## 2020-11-07 DIAGNOSIS — F17210 Nicotine dependence, cigarettes, uncomplicated: Secondary | ICD-10-CM | POA: Diagnosis present

## 2020-11-07 DIAGNOSIS — F1410 Cocaine abuse, uncomplicated: Secondary | ICD-10-CM | POA: Diagnosis present

## 2020-11-07 DIAGNOSIS — I2510 Atherosclerotic heart disease of native coronary artery without angina pectoris: Secondary | ICD-10-CM | POA: Diagnosis present

## 2020-11-07 DIAGNOSIS — Z8249 Family history of ischemic heart disease and other diseases of the circulatory system: Secondary | ICD-10-CM

## 2020-11-07 DIAGNOSIS — I493 Ventricular premature depolarization: Secondary | ICD-10-CM | POA: Diagnosis present

## 2020-11-07 DIAGNOSIS — K2970 Gastritis, unspecified, without bleeding: Secondary | ICD-10-CM | POA: Diagnosis present

## 2020-11-07 DIAGNOSIS — M4802 Spinal stenosis, cervical region: Secondary | ICD-10-CM | POA: Diagnosis present

## 2020-11-07 DIAGNOSIS — Z79899 Other long term (current) drug therapy: Secondary | ICD-10-CM

## 2020-11-07 DIAGNOSIS — E861 Hypovolemia: Secondary | ICD-10-CM | POA: Diagnosis present

## 2020-11-07 DIAGNOSIS — N1831 Chronic kidney disease, stage 3a: Secondary | ICD-10-CM | POA: Diagnosis present

## 2020-11-07 DIAGNOSIS — E871 Hypo-osmolality and hyponatremia: Secondary | ICD-10-CM | POA: Diagnosis present

## 2020-11-07 DIAGNOSIS — R188 Other ascites: Secondary | ICD-10-CM | POA: Diagnosis present

## 2020-11-07 DIAGNOSIS — W19XXXA Unspecified fall, initial encounter: Secondary | ICD-10-CM | POA: Diagnosis present

## 2020-11-07 DIAGNOSIS — Z8546 Personal history of malignant neoplasm of prostate: Secondary | ICD-10-CM | POA: Diagnosis not present

## 2020-11-07 DIAGNOSIS — I5043 Acute on chronic combined systolic (congestive) and diastolic (congestive) heart failure: Secondary | ICD-10-CM | POA: Diagnosis present

## 2020-11-07 DIAGNOSIS — K219 Gastro-esophageal reflux disease without esophagitis: Secondary | ICD-10-CM | POA: Diagnosis present

## 2020-11-07 DIAGNOSIS — R140 Abdominal distension (gaseous): Secondary | ICD-10-CM

## 2020-11-07 DIAGNOSIS — E1140 Type 2 diabetes mellitus with diabetic neuropathy, unspecified: Secondary | ICD-10-CM | POA: Diagnosis present

## 2020-11-07 DIAGNOSIS — I272 Pulmonary hypertension, unspecified: Secondary | ICD-10-CM | POA: Diagnosis present

## 2020-11-07 DIAGNOSIS — B182 Chronic viral hepatitis C: Secondary | ICD-10-CM | POA: Diagnosis present

## 2020-11-07 DIAGNOSIS — R1032 Left lower quadrant pain: Secondary | ICD-10-CM | POA: Diagnosis not present

## 2020-11-07 DIAGNOSIS — R339 Retention of urine, unspecified: Secondary | ICD-10-CM | POA: Diagnosis present

## 2020-11-07 DIAGNOSIS — I5023 Acute on chronic systolic (congestive) heart failure: Secondary | ICD-10-CM | POA: Diagnosis not present

## 2020-11-07 DIAGNOSIS — R55 Syncope and collapse: Secondary | ICD-10-CM | POA: Diagnosis present

## 2020-11-07 DIAGNOSIS — Z20822 Contact with and (suspected) exposure to covid-19: Secondary | ICD-10-CM | POA: Diagnosis present

## 2020-11-07 DIAGNOSIS — E785 Hyperlipidemia, unspecified: Secondary | ICD-10-CM | POA: Diagnosis present

## 2020-11-07 DIAGNOSIS — I509 Heart failure, unspecified: Secondary | ICD-10-CM

## 2020-11-07 DIAGNOSIS — I482 Chronic atrial fibrillation, unspecified: Secondary | ICD-10-CM | POA: Diagnosis not present

## 2020-11-07 DIAGNOSIS — Z8673 Personal history of transient ischemic attack (TIA), and cerebral infarction without residual deficits: Secondary | ICD-10-CM

## 2020-11-07 DIAGNOSIS — G8929 Other chronic pain: Secondary | ICD-10-CM | POA: Diagnosis present

## 2020-11-07 DIAGNOSIS — I4891 Unspecified atrial fibrillation: Secondary | ICD-10-CM | POA: Diagnosis not present

## 2020-11-07 LAB — COMPREHENSIVE METABOLIC PANEL
ALT: 25 U/L (ref 0–44)
AST: 32 U/L (ref 15–41)
Albumin: 2.6 g/dL — ABNORMAL LOW (ref 3.5–5.0)
Alkaline Phosphatase: 67 U/L (ref 38–126)
Anion gap: 8 (ref 5–15)
BUN: 12 mg/dL (ref 6–20)
CO2: 24 mmol/L (ref 22–32)
Calcium: 7.8 mg/dL — ABNORMAL LOW (ref 8.9–10.3)
Chloride: 84 mmol/L — ABNORMAL LOW (ref 98–111)
Creatinine, Ser: 1.14 mg/dL (ref 0.61–1.24)
GFR, Estimated: >60 mL/min (ref >60)
Glucose, Bld: 128 mg/dL — ABNORMAL HIGH (ref 70–99)
Potassium: 4.5 mmol/L (ref 3.5–5.1)
Sodium: 116 mmol/L — CL (ref 135–145)
Total Bilirubin: 0.7 mg/dL (ref 0.3–1.2)
Total Protein: 5.9 g/dL — ABNORMAL LOW (ref 6.5–8.1)

## 2020-11-07 LAB — OSMOLALITY, URINE: Osmolality, Ur: 231 mOsm/kg — ABNORMAL LOW (ref 300–900)

## 2020-11-07 LAB — CBC
HCT: 35 % — ABNORMAL LOW (ref 39.0–52.0)
Hemoglobin: 12 g/dL — ABNORMAL LOW (ref 13.0–17.0)
MCH: 28.5 pg (ref 26.0–34.0)
MCHC: 34.3 g/dL (ref 30.0–36.0)
MCV: 83.1 fL (ref 80.0–100.0)
Platelets: 306 10*3/uL (ref 150–400)
RBC: 4.21 MIL/uL — ABNORMAL LOW (ref 4.22–5.81)
RDW: 15 % (ref 11.5–15.5)
WBC: 3.6 10*3/uL — ABNORMAL LOW (ref 4.0–10.5)
nRBC: 0 % (ref 0.0–0.2)

## 2020-11-07 LAB — CBC WITH DIFFERENTIAL/PLATELET
Abs Immature Granulocytes: 0.01 10*3/uL (ref 0.00–0.07)
Basophils Absolute: 0 10*3/uL (ref 0.0–0.1)
Basophils Relative: 1 %
Eosinophils Absolute: 0 10*3/uL (ref 0.0–0.5)
Eosinophils Relative: 1 %
HCT: 36.3 % — ABNORMAL LOW (ref 39.0–52.0)
Hemoglobin: 12.3 g/dL — ABNORMAL LOW (ref 13.0–17.0)
Immature Granulocytes: 0 %
Lymphocytes Relative: 8 %
Lymphs Abs: 0.3 10*3/uL — ABNORMAL LOW (ref 0.7–4.0)
MCH: 28.3 pg (ref 26.0–34.0)
MCHC: 33.9 g/dL (ref 30.0–36.0)
MCV: 83.4 fL (ref 80.0–100.0)
Monocytes Absolute: 0.5 10*3/uL (ref 0.1–1.0)
Monocytes Relative: 13 %
Neutro Abs: 2.9 10*3/uL (ref 1.7–7.7)
Neutrophils Relative %: 77 %
Platelets: 339 10*3/uL (ref 150–400)
RBC: 4.35 MIL/uL (ref 4.22–5.81)
RDW: 14.9 % (ref 11.5–15.5)
WBC: 3.7 10*3/uL — ABNORMAL LOW (ref 4.0–10.5)
nRBC: 0 % (ref 0.0–0.2)

## 2020-11-07 LAB — RENAL FUNCTION PANEL
Albumin: 2.5 g/dL — ABNORMAL LOW (ref 3.5–5.0)
Albumin: 2.4 g/dL — ABNORMAL LOW (ref 3.5–5.0)
Albumin: 2.4 g/dL — ABNORMAL LOW (ref 3.5–5.0)
Anion gap: 4 — ABNORMAL LOW (ref 5–15)
Anion gap: 5 (ref 5–15)
Anion gap: 10 (ref 5–15)
BUN: 12 mg/dL (ref 6–20)
BUN: 16 mg/dL (ref 6–20)
BUN: 17 mg/dL (ref 6–20)
CO2: 30 mmol/L (ref 22–32)
CO2: 28 mmol/L (ref 22–32)
CO2: 23 mmol/L (ref 22–32)
Calcium: 7.7 mg/dL — ABNORMAL LOW (ref 8.9–10.3)
Calcium: 7.7 mg/dL — ABNORMAL LOW (ref 8.9–10.3)
Calcium: 7.9 mg/dL — ABNORMAL LOW (ref 8.9–10.3)
Chloride: 86 mmol/L — ABNORMAL LOW (ref 98–111)
Chloride: 88 mmol/L — ABNORMAL LOW (ref 98–111)
Chloride: 87 mmol/L — ABNORMAL LOW (ref 98–111)
Creatinine, Ser: 1.19 mg/dL (ref 0.61–1.24)
Creatinine, Ser: 1.39 mg/dL — ABNORMAL HIGH (ref 0.61–1.24)
Creatinine, Ser: 1.37 mg/dL — ABNORMAL HIGH (ref 0.61–1.24)
GFR, Estimated: >60 mL/min (ref >60)
GFR, Estimated: 58 mL/min — ABNORMAL LOW (ref >60)
GFR, Estimated: 59 mL/min — ABNORMAL LOW (ref >60)
Glucose, Bld: 117 mg/dL — ABNORMAL HIGH (ref 70–99)
Glucose, Bld: 117 mg/dL — ABNORMAL HIGH (ref 70–99)
Glucose, Bld: 118 mg/dL — ABNORMAL HIGH (ref 70–99)
Phosphorus: 3.4 mg/dL (ref 2.5–4.6)
Phosphorus: 3.6 mg/dL (ref 2.5–4.6)
Phosphorus: 3.4 mg/dL (ref 2.5–4.6)
Potassium: 4.1 mmol/L (ref 3.5–5.1)
Potassium: 4.7 mmol/L (ref 3.5–5.1)
Potassium: 4.4 mmol/L (ref 3.5–5.1)
Sodium: 120 mmol/L — ABNORMAL LOW (ref 135–145)
Sodium: 121 mmol/L — ABNORMAL LOW (ref 135–145)
Sodium: 120 mmol/L — ABNORMAL LOW (ref 135–145)

## 2020-11-07 LAB — RESPIRATORY PANEL BY RT PCR (FLU A&B, COVID)
Influenza A by PCR: NEGATIVE
Influenza B by PCR: NEGATIVE
SARS Coronavirus 2 by RT PCR: NEGATIVE

## 2020-11-07 LAB — URINALYSIS, ROUTINE W REFLEX MICROSCOPIC
Bacteria, UA: NONE SEEN
Bilirubin Urine: NEGATIVE
Glucose, UA: NEGATIVE mg/dL
Hgb urine dipstick: NEGATIVE
Ketones, ur: NEGATIVE mg/dL
Leukocytes,Ua: NEGATIVE
Nitrite: NEGATIVE
Protein, ur: 30 mg/dL — AB
Specific Gravity, Urine: 1.01 (ref 1.005–1.030)
pH: 5 (ref 5.0–8.0)

## 2020-11-07 LAB — VITAMIN B12: Vitamin B-12: 305 pg/mL (ref 180–914)

## 2020-11-07 LAB — PROTIME-INR
INR: 1 (ref 0.8–1.2)
Prothrombin Time: 12.6 seconds (ref 11.4–15.2)

## 2020-11-07 LAB — LIPASE, BLOOD: Lipase: 35 U/L (ref 11–51)

## 2020-11-07 LAB — BRAIN NATRIURETIC PEPTIDE: B Natriuretic Peptide: 567.2 pg/mL — ABNORMAL HIGH (ref 0.0–100.0)

## 2020-11-07 LAB — SODIUM, URINE, RANDOM: Sodium, Ur: <10 mmol/L

## 2020-11-07 MED ORDER — FUROSEMIDE 10 MG/ML IJ SOLN
40.0000 mg | Freq: Once | INTRAMUSCULAR | Status: AC
  Administered 2020-11-07: 40 mg via INTRAVENOUS
  Filled 2020-11-07: qty 4

## 2020-11-07 MED ORDER — ONDANSETRON HCL 4 MG PO TABS: 4.0000 mg | ORAL_TABLET | Freq: Four times a day (QID) | ORAL | Status: DC | PRN

## 2020-11-07 MED ORDER — THIAMINE HCL 100 MG PO TABS
100.0000 mg | ORAL_TABLET | Freq: Every day | ORAL | Status: DC
  Administered 2020-11-07 – 2020-11-13 (×7): 100 mg via ORAL
  Filled 2020-11-07 (×7): qty 1

## 2020-11-07 MED ORDER — ONDANSETRON HCL 4 MG/2ML IJ SOLN: 4.0000 mg | Freq: Four times a day (QID) | INTRAMUSCULAR | Status: DC | PRN

## 2020-11-07 MED ORDER — THIAMINE HCL 100 MG/ML IJ SOLN
100.0000 mg | Freq: Every day | INTRAMUSCULAR | Status: DC
  Filled 2020-11-07: qty 2

## 2020-11-07 MED ORDER — LOPERAMIDE HCL 2 MG PO CAPS
4.0000 mg | ORAL_CAPSULE | Freq: Once | ORAL | Status: AC
  Administered 2020-11-07: 4 mg via ORAL
  Filled 2020-11-07: qty 2

## 2020-11-07 MED ORDER — ENOXAPARIN SODIUM 40 MG/0.4ML ~~LOC~~ SOLN
40.0000 mg | SUBCUTANEOUS | Status: DC
  Administered 2020-11-07 – 2020-11-10 (×4): 40 mg via SUBCUTANEOUS
  Filled 2020-11-07 (×4): qty 0.4

## 2020-11-07 MED ORDER — FOLIC ACID 1 MG PO TABS
1.0000 mg | ORAL_TABLET | Freq: Every day | ORAL | Status: DC
  Administered 2020-11-07 – 2020-11-13 (×7): 1 mg via ORAL
  Filled 2020-11-07 (×7): qty 1

## 2020-11-07 MED ORDER — LACTATED RINGERS IV BOLUS
1000.0000 mL | Freq: Once | INTRAVENOUS | Status: AC
  Administered 2020-11-07: 1000 mL via INTRAVENOUS

## 2020-11-07 MED ORDER — ATORVASTATIN CALCIUM 20 MG PO TABS
20.0000 mg | ORAL_TABLET | Freq: Every day | ORAL | Status: DC
  Administered 2020-11-07 – 2020-11-12 (×6): 20 mg via ORAL
  Filled 2020-11-07 (×5): qty 1
  Filled 2020-11-07: qty 2

## 2020-11-07 MED ORDER — GABAPENTIN 300 MG PO CAPS: 600.0000 mg | ORAL_CAPSULE | ORAL | Status: DC

## 2020-11-07 MED ORDER — ACETAMINOPHEN 325 MG PO TABS: 650.0000 mg | ORAL_TABLET | Freq: Four times a day (QID) | ORAL | Status: DC | PRN

## 2020-11-07 MED ORDER — MORPHINE SULFATE (PF) 4 MG/ML IV SOLN
4.0000 mg | Freq: Once | INTRAVENOUS | Status: AC
  Administered 2020-11-07: 4 mg via INTRAVENOUS
  Filled 2020-11-07: qty 1

## 2020-11-07 MED ORDER — ACETAMINOPHEN 650 MG RE SUPP: 650.0000 mg | Freq: Four times a day (QID) | RECTAL | Status: DC | PRN

## 2020-11-07 MED ORDER — DULOXETINE HCL 60 MG PO CPEP
60.0000 mg | ORAL_CAPSULE | Freq: Every day | ORAL | Status: DC
  Administered 2020-11-07 – 2020-11-13 (×7): 60 mg via ORAL
  Filled 2020-11-07 (×3): qty 1
  Filled 2020-11-07: qty 2
  Filled 2020-11-07 (×3): qty 1

## 2020-11-07 MED ORDER — IOHEXOL 300 MG/ML  SOLN
100.0000 mL | Freq: Once | INTRAMUSCULAR | Status: AC | PRN
  Administered 2020-11-07: 100 mL via INTRAVENOUS

## 2020-11-07 MED ORDER — NICOTINE 14 MG/24HR TD PT24
14.0000 mg | MEDICATED_PATCH | Freq: Every day | TRANSDERMAL | Status: DC
  Administered 2020-11-07 – 2020-11-13 (×7): 14 mg via TRANSDERMAL
  Filled 2020-11-07 (×7): qty 1

## 2020-11-07 MED ORDER — GABAPENTIN 300 MG PO CAPS
600.0000 mg | ORAL_CAPSULE | Freq: Every day | ORAL | Status: DC
  Administered 2020-11-08 – 2020-11-13 (×6): 600 mg via ORAL
  Filled 2020-11-07 (×6): qty 2

## 2020-11-07 MED ORDER — ADULT MULTIVITAMIN W/MINERALS CH
1.0000 | ORAL_TABLET | Freq: Every day | ORAL | Status: DC
  Administered 2020-11-07 – 2020-11-13 (×7): 1 via ORAL
  Filled 2020-11-07 (×7): qty 1

## 2020-11-07 MED ORDER — LORAZEPAM 1 MG PO TABS
0.0000 mg | ORAL_TABLET | Freq: Four times a day (QID) | ORAL | Status: AC
  Administered 2020-11-07: 1 mg via ORAL
  Administered 2020-11-07 – 2020-11-08 (×5): 2 mg via ORAL
  Filled 2020-11-07: qty 1
  Filled 2020-11-07 (×5): qty 2

## 2020-11-07 MED ORDER — OXYCODONE HCL 5 MG PO TABS
5.0000 mg | ORAL_TABLET | ORAL | Status: DC | PRN
  Administered 2020-11-07 – 2020-11-10 (×11): 5 mg via ORAL
  Filled 2020-11-07 (×11): qty 1

## 2020-11-07 MED ORDER — SODIUM CHLORIDE (PF) 0.9 % IJ SOLN
INTRAMUSCULAR | Status: AC
  Filled 2020-11-07: qty 50

## 2020-11-07 MED ORDER — ISOSORB DINITRATE-HYDRALAZINE 20-37.5 MG PO TABS
1.0000 | ORAL_TABLET | Freq: Three times a day (TID) | ORAL | Status: DC
  Administered 2020-11-07 – 2020-11-08 (×6): 1 via ORAL
  Filled 2020-11-07 (×9): qty 1

## 2020-11-07 MED ORDER — GABAPENTIN 300 MG PO CAPS
900.0000 mg | ORAL_CAPSULE | Freq: Every day | ORAL | Status: DC
  Administered 2020-11-07 – 2020-11-12 (×6): 900 mg via ORAL
  Filled 2020-11-07 (×6): qty 3

## 2020-11-07 MED ORDER — FUROSEMIDE 10 MG/ML IJ SOLN
40.0000 mg | Freq: Two times a day (BID) | INTRAMUSCULAR | Status: AC
  Administered 2020-11-07 – 2020-11-08 (×2): 40 mg via INTRAVENOUS
  Filled 2020-11-07 (×2): qty 4

## 2020-11-07 MED ORDER — AMIODARONE HCL 200 MG PO TABS
200.0000 mg | ORAL_TABLET | Freq: Every day | ORAL | Status: DC
  Administered 2020-11-07 – 2020-11-08 (×2): 200 mg via ORAL
  Filled 2020-11-07 (×2): qty 1

## 2020-11-07 MED ORDER — PANTOPRAZOLE SODIUM 40 MG PO TBEC
40.0000 mg | DELAYED_RELEASE_TABLET | Freq: Every day | ORAL | Status: DC
  Administered 2020-11-07 – 2020-11-13 (×7): 40 mg via ORAL
  Filled 2020-11-07 (×7): qty 1

## 2020-11-07 MED ORDER — LORAZEPAM 2 MG/ML IJ SOLN: 0.0000 mg | Freq: Two times a day (BID) | INTRAMUSCULAR | Status: AC

## 2020-11-07 MED ORDER — LORAZEPAM 2 MG/ML IJ SOLN: 0.0000 mg | Freq: Four times a day (QID) | INTRAMUSCULAR | Status: AC

## 2020-11-07 MED ORDER — LORAZEPAM 1 MG PO TABS
0.0000 mg | ORAL_TABLET | Freq: Two times a day (BID) | ORAL | Status: AC
  Administered 2020-11-09 – 2020-11-10 (×2): 1 mg via ORAL
  Administered 2020-11-10: 2 mg via ORAL
  Administered 2020-11-11: 1 mg via ORAL
  Filled 2020-11-07: qty 2
  Filled 2020-11-07 (×3): qty 1
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
## 2020-11-07 NOTE — ED Notes (Signed)
Pt sts he drinks a "40 of beer every day". Last drink 2 days ago. C/O anxiety, nausea, itching and HA. CIWA 10. Calm and cooperative at this time. A/Ox4. Dr. Roxanne Mins made aware.

## 2020-11-07 NOTE — Plan of Care (Signed)
  Problem: Education: Goal: Knowledge of General Education information will improve Description Including pain rating scale, medication(s)/side effects and non-pharmacologic comfort measures Outcome: Progressing   Problem: Health Behavior/Discharge Planning: Goal: Ability to manage health-related needs will improve Outcome: Progressing   Problem: Clinical Measurements: Goal: Ability to maintain clinical measurements within normal limits will improve Outcome: Progressing Goal: Will remain free from infection Outcome: Progressing Goal: Diagnostic test results will improve Outcome: Progressing Goal: Respiratory complications will improve Outcome: Progressing Goal: Cardiovascular complication will be avoided Outcome: Progressing   Problem: Activity: Goal: Risk for activity intolerance will decrease Outcome: Progressing   Problem: Nutrition: Goal: Adequate nutrition will be maintained Outcome: Progressing   Problem: Coping: Goal: Level of anxiety will decrease Outcome: Progressing   Problem: Elimination: Goal: Will not experience complications related to bowel motility Outcome: Progressing Goal: Will not experience complications related to urinary retention Outcome: Progressing   Problem: Pain Managment: Goal: General experience of comfort will improve Outcome: Progressing   Problem: Safety: Goal: Ability to remain free from injury will improve Outcome: Progressing   Problem: Skin Integrity: Goal: Risk for impaired skin integrity will decrease Outcome: Progressing   Problem: Education: Goal: Knowledge of disease or condition will improve Outcome: Progressing Goal: Understanding of discharge needs will improve Outcome: Progressing   Problem: Health Behavior/Discharge Planning: Goal: Ability to identify changes in lifestyle to reduce recurrence of condition will improve Outcome: Progressing Goal: Identification of resources available to assist in meeting health  care needs will improve Outcome: Progressing   Problem: Physical Regulation: Goal: Complications related to the disease process, condition or treatment will be avoided or minimized Outcome: Progressing   Problem: Safety: Goal: Ability to remain free from injury will improve Outcome: Progressing   

## 2020-11-07 NOTE — ED Triage Notes (Signed)
BIB EMS with c/o abd pain and diarrhea x 2 days

## 2020-11-08 ENCOUNTER — Telehealth (HOSPITAL_COMMUNITY): Payer: Self-pay | Admitting: Licensed Clinical Social Worker

## 2020-11-08 DIAGNOSIS — I482 Chronic atrial fibrillation, unspecified: Secondary | ICD-10-CM

## 2020-11-08 DIAGNOSIS — I5023 Acute on chronic systolic (congestive) heart failure: Secondary | ICD-10-CM | POA: Diagnosis not present

## 2020-11-08 LAB — RENAL FUNCTION PANEL
Albumin: 2.4 g/dL — ABNORMAL LOW (ref 3.5–5.0)
Albumin: 2.6 g/dL — ABNORMAL LOW (ref 3.5–5.0)
Albumin: 2.3 g/dL — ABNORMAL LOW (ref 3.5–5.0)
Albumin: 2.8 g/dL — ABNORMAL LOW (ref 3.5–5.0)
Anion gap: 10 (ref 5–15)
Anion gap: 12 (ref 5–15)
Anion gap: 8 (ref 5–15)
Anion gap: 9 (ref 5–15)
BUN: 17 mg/dL (ref 6–20)
BUN: 18 mg/dL (ref 6–20)
BUN: 20 mg/dL (ref 6–20)
BUN: 20 mg/dL (ref 6–20)
CO2: 22 mmol/L (ref 22–32)
CO2: 22 mmol/L (ref 22–32)
CO2: 24 mmol/L (ref 22–32)
CO2: 23 mmol/L (ref 22–32)
Calcium: 7.6 mg/dL — ABNORMAL LOW (ref 8.9–10.3)
Calcium: 8.2 mg/dL — ABNORMAL LOW (ref 8.9–10.3)
Calcium: 7.6 mg/dL — ABNORMAL LOW (ref 8.9–10.3)
Calcium: 7.7 mg/dL — ABNORMAL LOW (ref 8.9–10.3)
Chloride: 87 mmol/L — ABNORMAL LOW (ref 98–111)
Chloride: 88 mmol/L — ABNORMAL LOW (ref 98–111)
Chloride: 86 mmol/L — ABNORMAL LOW (ref 98–111)
Chloride: 85 mmol/L — ABNORMAL LOW (ref 98–111)
Creatinine, Ser: 1.35 mg/dL — ABNORMAL HIGH (ref 0.61–1.24)
Creatinine, Ser: 1.31 mg/dL — ABNORMAL HIGH (ref 0.61–1.24)
Creatinine, Ser: 1.45 mg/dL — ABNORMAL HIGH (ref 0.61–1.24)
Creatinine, Ser: 1.42 mg/dL — ABNORMAL HIGH (ref 0.61–1.24)
GFR, Estimated: >60 mL/min (ref >60)
GFR, Estimated: >60 mL/min (ref >60)
GFR, Estimated: 55 mL/min — ABNORMAL LOW (ref >60)
GFR, Estimated: 57 mL/min — ABNORMAL LOW (ref >60)
Glucose, Bld: 104 mg/dL — ABNORMAL HIGH (ref 70–99)
Glucose, Bld: 99 mg/dL (ref 70–99)
Glucose, Bld: 168 mg/dL — ABNORMAL HIGH (ref 70–99)
Glucose, Bld: 164 mg/dL — ABNORMAL HIGH (ref 70–99)
Phosphorus: 3.6 mg/dL (ref 2.5–4.6)
Phosphorus: 3.9 mg/dL (ref 2.5–4.6)
Phosphorus: 3.4 mg/dL (ref 2.5–4.6)
Phosphorus: 3.5 mg/dL (ref 2.5–4.6)
Potassium: 4.7 mmol/L (ref 3.5–5.1)
Potassium: 4.1 mmol/L (ref 3.5–5.1)
Potassium: 4.1 mmol/L (ref 3.5–5.1)
Potassium: 4.4 mmol/L (ref 3.5–5.1)
Sodium: 119 mmol/L — CL (ref 135–145)
Sodium: 122 mmol/L — ABNORMAL LOW (ref 135–145)
Sodium: 118 mmol/L — CL (ref 135–145)
Sodium: 117 mmol/L — CL (ref 135–145)

## 2020-11-08 LAB — CBC
HCT: 33.6 % — ABNORMAL LOW (ref 39.0–52.0)
Hemoglobin: 11.2 g/dL — ABNORMAL LOW (ref 13.0–17.0)
MCH: 28.2 pg (ref 26.0–34.0)
MCHC: 33.3 g/dL (ref 30.0–36.0)
MCV: 84.6 fL (ref 80.0–100.0)
Platelets: 235 10*3/uL (ref 150–400)
RBC: 3.97 MIL/uL — ABNORMAL LOW (ref 4.22–5.81)
RDW: 15.1 % (ref 11.5–15.5)
WBC: 5.6 10*3/uL (ref 4.0–10.5)
nRBC: 0 % (ref 0.0–0.2)

## 2020-11-08 LAB — HEPATIC FUNCTION PANEL
ALT: 19 U/L (ref 0–44)
AST: 30 U/L (ref 15–41)
Albumin: 2.2 g/dL — ABNORMAL LOW (ref 3.5–5.0)
Alkaline Phosphatase: 52 U/L (ref 38–126)
Bilirubin, Direct: 0.2 mg/dL (ref 0.0–0.2)
Indirect Bilirubin: 0.4 mg/dL (ref 0.3–0.9)
Total Bilirubin: 0.6 mg/dL (ref 0.3–1.2)
Total Protein: 5.3 g/dL — ABNORMAL LOW (ref 6.5–8.1)

## 2020-11-08 LAB — PROTIME-INR
INR: 1 (ref 0.8–1.2)
Prothrombin Time: 13.2 seconds (ref 11.4–15.2)

## 2020-11-08 MED ORDER — ALBUMIN HUMAN 25 % IV SOLN
25.0000 g | Freq: Once | INTRAVENOUS | Status: AC
  Administered 2020-11-08: 25 g via INTRAVENOUS
  Filled 2020-11-08: qty 100

## 2020-11-08 MED ORDER — FUROSEMIDE 10 MG/ML IJ SOLN
40.0000 mg | Freq: Once | INTRAMUSCULAR | Status: AC
  Administered 2020-11-08: 40 mg via INTRAVENOUS
  Filled 2020-11-08: qty 4

## 2020-11-08 MED ORDER — FUROSEMIDE 10 MG/ML IJ SOLN
40.0000 mg | Freq: Two times a day (BID) | INTRAMUSCULAR | Status: DC
  Administered 2020-11-08 – 2020-11-09 (×2): 40 mg via INTRAVENOUS
  Filled 2020-11-08 (×2): qty 4

## 2020-11-08 MED ORDER — FUROSEMIDE 10 MG/ML IJ SOLN: 40.0000 mg | Freq: Two times a day (BID) | INTRAMUSCULAR | Status: DC

## 2020-11-08 NOTE — Consult Note (Addendum)
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 falls.  Tobacco abuse. He is aware he should stop          New York Heart Association (NYHA) Functional Class NYHA Class III   CHA2DS2-VASc Score = 4  This indicates a 4.8% annual risk of stroke. The patient's score is based upon: CHF History: 1 HTN History: 1 Diabetes History: 1 Stroke History: 0 Vascular Disease History: 1 Age Score: 0 Gender Score: 0  BUT PT is NOT a Candidate for anticoag see above.         For questions or updates, please contact [place] Please consult [email or web address] for contact info under    Signed, Cecilie Kicks, NP  11/08/2020 1:30 PM  Patient examined chart reviewed. Chronically ill male. Lungs clear mild JVP  elevation PMI increased with MR murmur Previous anterior cervicale neck surgery Previous left knee and right ankle surgery Afib is rate controlled and agree he is not a candidate for anticoagulation for stroke prevention Continue diuretics to keep I/O even and euvolemic. Not clear that his renal function is bad enough to withhold enteresto/ARB but should further discuss this with Dr Aundra Dubin Getting valium to prevent DT;s  If Na stays low can consider ConiVaptin Rx for 34 days   Jenkins Rouge MD [facility]

## 2020-11-08 NOTE — Telephone Encounter (Signed)
[facility] CSW saw pt was admitted to the hospital at this time.  Attempted to call pt to check in- unable to reach and unable to leave VM  [name], [facility] Desk#: [phone] Cell#: [phone]

## 2020-11-08 NOTE — Progress Notes (Signed)
PROGRESS NOTE    Brad [name]  MLY:[id number] DOB: 04-24-1961 DOA: 11/07/2020 PCP: Charlott Rakes, MD  Chief Complaint  Patient presents with  . Abdominal Pain  . Diarrhea   Brief Narrative:  Brad [name] is Brad [name] 59 y.o. male with medical history significant of HFrEF, Neuropathy, Patti Shorb fib, EtOH abuse. Presenting with stomach pain for the last 2 days. He reports it's in the RLQ and LLQ. It's [employer] throbbing pain that is intermittent in nature. It has been accompanied by diarrhea. He took an unspecified medicine to help, but it provided no relief. He also reports [name] couple of falls in the last 2 days. He denies head injury or LOC. He says he simply got weak and fell to the ground. He became concerned and came to the ED.    ED Course: CT ab pelvis obtained. Lab work showed hyponatremia. He was given lasix and TRH called for admission.   Assessment & Plan:   Active Problems:   CHF, acute on chronic (HCC)  Acute on chronic hypnatremia  Hypervolemic Hyponatremia     - continue lasix for volume overload     - fluid restriction      - Na 116 on 11/17, correct gradually     - 122 this AM, continue lasix and fluid restriction, trend BMP     - low urine sodium     - renal recommended cardiology c/s in setting of his heart failure - appreciate assistance     - CT ab/pelvis shows anasarca      - q6h BMP  Anasarca  Volume Overload  Acute on Chronic Systolic Heart Failure Exacerbation     - continue diuresis as noted above     - echo 8/11 with EF 30-35% (see report)     - Continue bidil     - appreciate cardiology recommendations   Proctitis  Diarrhea     - infectious vs inflammatory     - follow C diff/GI pathogen panel     - hold off on abx for now  Cirrhosis      - low albumin, normal platelets, bilirubin, INR      - CT findings with changes c/w portal colopathy and portal gastritis      - ? 2/2 hx etoh use - needs outpatient follow up - follow acute hepatitis panel  EtOH abuse  Hx of cocaine abuse Tobacco abuse     - CIWAA; thiamine, B12, folate     - check thiamine (pending) and B12 (wnl) levels     - check UDS -> positive for cocaine      - nicotine patch     - counseled against further EtOh/tobacco use  Neuropathy     - continue cymbalta, neurontin  Denni France fib     - amiodarone, will discuss d/c with cards in setting of cirrhosis  DVT prophylaxis: lovenox Code Status: full  Family Communication: none at bedside Disposition:   Status is: Inpatient  Remains inpatient appropriate because:Inpatient level of care appropriate due to severity of illness   Dispo: The patient is from: Home              Anticipated d/c is to: Home              Anticipated d/c date is: > 3 days              Patient currently is not medically stable to d/c.   Consultants:   Cardiology  Renal  Procedures:   none  Antimicrobials:  Anti-infectives (From admission, onward)   None         Subjective: Improved abdominal pain  Feeling generally better  Objective: Vitals:   11/07/20 2128 11/08/20 0535 11/08/20 1000 11/08/20 1311  BP: 114/70 (!) 154/89 131/79 101/66  Pulse: 69 68 65 67  Resp: 20 18  14   Temp: 98 F (36.7 C)   97.6 F (36.4 C)  TempSrc: Oral   Oral  SpO2: 96% 98%  97%  Weight:      Height:        Intake/Output Summary (Last 24 hours) at 11/08/2020 1526 Last data filed at 11/08/2020 1355 Gross per 24 hour  Intake 240 ml  Output 750 ml  Net -510 ml   Filed Weights   11/07/20 0204 11/07/20 0505  Weight: 84 kg 79.4 kg    Examination:  General exam: Appears calm and comfortable  Respiratory system: Clear to auscultation. Respiratory effort normal. Cardiovascular system: S1 & S2 heard, RRR. +hepatojugular reflux. Gastrointestinal system: Abdomen is nondistended, soft and nontender. Central nervous system: Alert and oriented. No focal neurological deficits. Extremities: anasarca, diffuse edema Skin: No rashes, lesions or ulcers  Psychiatry: Judgement and insight appear normal. Mood & affect appropriate.     Data Reviewed: I have personally reviewed following labs and imaging studies  CBC: Recent Labs  Lab 11/07/20 0301 11/07/20 1048 11/08/20 0318  WBC 3.7* 3.6* 5.6  NEUTROABS 2.9  --   --   HGB 12.3* 12.0* 11.2*  HCT 36.3* 35.0* 33.6*  MCV 83.4 83.1 84.6  PLT 339 306 433    Basic Metabolic Panel: Recent Labs  Lab 11/07/20 1049 11/07/20 1613 11/07/20 2224 11/08/20 0318 11/08/20 0933  NA 120* 121* 120* 119* 122*  K 4.1 4.7 4.4 4.7 4.1  CL 86* 88* 87* 87* 88*  CO2 30 28 23 22 22   GLUCOSE 117* 117* 118* 104* 99  BUN 12 16 17 17 18   CREATININE 1.19 1.39* 1.37* 1.35* 1.31*  CALCIUM 7.7* 7.7* 7.9* 7.6* 8.2*  PHOS 3.4 3.6 3.4 3.6 3.9    GFR: Estimated Creatinine Clearance: 63.9 mL/min (Brysen Shankman) (by C-G formula based on SCr of 1.31 mg/dL (H)).  Liver Function Tests: Recent Labs  Lab 11/07/20 0301 11/07/20 1049 11/07/20 1613 11/07/20 2224 11/08/20 0318 11/08/20 0933 11/08/20 1149  AST 32  --   --   --   --   --  30  ALT 25  --   --   --   --   --  19  ALKPHOS 67  --   --   --   --   --  52  BILITOT 0.7  --   --   --   --   --  0.6  PROT 5.9*  --   --   --   --   --  5.3*  ALBUMIN 2.6*   < > 2.4* 2.4* 2.4* 2.6* 2.2*   < > = values in this interval not displayed.    CBG: No results for input(s): GLUCAP in the last 168 hours.   Recent Results (from the past 240 hour(s))  Respiratory Panel by RT PCR (Flu Wilhelmine Krogstad&B, Covid) - Nasopharyngeal Swab     Status: None   Collection Time: 11/07/20  4:10 AM   Specimen: Nasopharyngeal Swab  Result Value Ref Range Status   SARS Coronavirus 2 by RT PCR NEGATIVE NEGATIVE Final    Comment: (NOTE) SARS-CoV-2 target nucleic acids are  NOT DETECTED.  The SARS-CoV-2 RNA is generally detectable in upper respiratoy specimens during the acute phase of infection. The lowest concentration of SARS-CoV-2 viral copies this assay can detect is 131 copies/mL. Munira Polson negative  result does not preclude SARS-Cov-2 infection and should not be used as the sole basis for treatment or other patient management decisions. Jonathon Tan negative result may occur with  improper specimen collection/handling, submission of specimen other than nasopharyngeal swab, presence of viral mutation(s) within the areas targeted by this assay, and inadequate number of viral copies (<131 copies/mL). Giannie Soliday negative result must be combined with clinical observations, patient history, and epidemiological information. The expected result is Negative.  Fact Sheet for Patients:  PinkCheek.be  Fact Sheet for Healthcare Providers:  GravelBags.it  This test is no t yet approved or cleared by the [place] FDA and  has been authorized for detection and/or diagnosis of SARS-CoV-2 by FDA under an Emergency Use Authorization (EUA). This EUA will remain  in effect (meaning this test can be used) for the duration of the COVID-19 declaration under Section 564(b)(1) of the Act, 21 U.S.C. section 360bbb-3(b)(1), unless the authorization is terminated or revoked sooner.     Influenza Manya Balash by PCR NEGATIVE NEGATIVE Final   Influenza B by PCR NEGATIVE NEGATIVE Final    Comment: (NOTE) The Xpert Xpress SARS-CoV-2/FLU/RSV assay is intended as an aid in  the diagnosis of influenza from Nasopharyngeal swab specimens and  should not be used as Annleigh Knueppel sole basis for treatment. Nasal washings and  aspirates are unacceptable for Xpert Xpress SARS-CoV-2/FLU/RSV  testing.  Fact Sheet for Patients: PinkCheek.be  Fact Sheet for Healthcare Providers: GravelBags.it  This test is not yet approved or cleared by the [place] FDA and  has been authorized for detection and/or diagnosis of SARS-CoV-2 by  FDA under an Emergency Use Authorization (EUA). This EUA will remain  in effect (meaning this test can be used)  for the duration of the  Covid-19 declaration under Section 564(b)(1) of the Act, 21  U.S.C. section 360bbb-3(b)(1), unless the authorization is  terminated or revoked. Performed at [facility], [place] [address], [place], [place] [address]          Radiology Studies: CT Cervical Spine Wo Contrast  Result Date: 11/07/2020 CLINICAL DATA:  Status post cervical fusion, multiple falls with neck injury EXAM: CT CERVICAL SPINE WITHOUT CONTRAST TECHNIQUE: Multidetector CT imaging of the cervical spine was performed without intravenous contrast. Multiplanar CT image reconstructions were also generated. COMPARISON:  None. FINDINGS: Alignment: Anterior cervical discectomy and fusion procedure with instrumentation at C4-C6 has been performed. No bridging callus yet identified. Normal alignment across the fused segment. Normal overall alignment. No listhesis. Skull base and vertebrae: The craniocervical junction is unremarkable. The atlantodental interval is normal. No acute fracture of the cervical spine. Soft tissues and spinal canal: There is congenital narrowing of the spinal canal due to shortening of the pedicles which, in combination with posterior disc osteophyte complex at C4-5 and C5-6 results in moderate to severe central canal stenosis with an AP diameter of 6-7 mm at minimum and resultant flattening of the thecal sac. No canal hematoma. No prevertebral soft tissue swelling. No paraspinal fluid collections. Disc levels: Vertebral body height has been preserved. Remaining intervertebral disc height has been preserved. The spinal canal is moderate to severely narrowed at the level of the cervical fusion secondary to posterior disc osteophyte complex ease. The prevertebral soft tissues are not thickened. Review of the axial images  demonstrates multilevel uncovertebral arthrosis resulting in mild bilateral neural foraminal narrowing at C3-4 and C6-7, moderate bilateral narrowing at  C4-5, and moderate to severe bilateral narrowing at C5-6. Upper chest: Right pleural effusion is identified, not well assessed on this examination. Other: None significant IMPRESSION: No acute fracture or listhesis of the cervical spine. C4-C6 anterior cervical discectomy infusion. Moderate to severe central canal stenosis at the level of the fused segment secondary to posterior disc osteophyte complexes in combination with congenital narrowing of the spinal canal at this level. Multilevel uncovertebral arthrosis resulting in multilevel neural foraminal narrowing, most severe bilaterally at C5-6. Right pleural effusion. Electronically Signed   By: Fidela Salisbury MD   On: 11/07/2020 04:48   CT ABDOMEN PELVIS W CONTRAST  Result Date: 11/07/2020 CLINICAL DATA:  Diverticulitis, abdominal pain, diarrhea EXAM: CT ABDOMEN AND PELVIS WITH CONTRAST TECHNIQUE: Multidetector CT imaging of the abdomen and pelvis was performed using the standard protocol following bolus administration of intravenous contrast. CONTRAST:  177mL OMNIPAQUE IOHEXOL 300 MG/ML  SOLN COMPARISON:  10/24/2020 FINDINGS: Lower chest: Mild to moderate global cardiomegaly. Small left and moderate to large right pleural effusions are present with compressive atelectasis of the right lung base noted. Hepatobiliary: Nodular change of the liver in keeping with cirrhosis again noted. No focal intrahepatic mass identified. Gallbladder unremarkable. No intra or extrahepatic biliary ductal dilation. Pancreas: Unremarkable Spleen: Unremarkable Adrenals/Urinary Tract: The adrenal glands are unremarkable. The kidneys are mildly atrophic bilaterally. Simple cortical cyst seen within the left ovary. Kidneys are otherwise unremarkable. Bladder unremarkable Stomach/Bowel: The colon is diffusely mildly thick walled without significant pericolonic inflammatory stranding suggesting changes of Latroya Ng portal colopathy. Similarly, there is rugal fold thickening and submucosal  edema involving the distal stomach which may reflect changes of portal gastropathy. The stomach, small bowel, and large bowel are otherwise unremarkable. No evidence of obstruction or focal inflammation. Appendix normal. There is trace mesenteric edema present. No free intraperitoneal gas. Vascular/Lymphatic: Extensive aortoiliac atherosclerotic calcification without evidence of aneurysm. No pathologic adenopathy within the abdomen and pelvis. Reproductive: Prostate is unremarkable. Other: There is diffuse subcutaneous body wall edema noted. Mild retroperitoneal edema within the pelvis noted. There is circumferential asymmetrically more severe wall thickening involving the rectum, possibly representing changes of superimposed proctitis. Mild perirectal edema is present. Musculoskeletal: No acute bone abnormality. No lytic or blastic bone lesion. IMPRESSION: Diffuse mild colonic wall thickening and rugal fold thickening within the distal stomach suggesting changes of portal colopathy and portal gastritis, respectively. Superimposed moderate circumferential wall thickening and mild perirectal edema suggesting superimposed infectious or inflammatory proctitis. Cirrhosis. Anasarca with bilateral pleural effusions, diffuse body wall edema, mesenteric edema, and retroperitoneal edema. Moderate cardiomegaly. Aortic Atherosclerosis (ICD10-I70.0). Electronically Signed   By: Fidela Salisbury MD   On: 11/07/2020 04:57        Scheduled Meds: . amiodarone  200 mg Oral Daily  . atorvastatin  20 mg Oral q1800  . DULoxetine  60 mg Oral Daily  . enoxaparin (LOVENOX) injection  40 mg Subcutaneous Q24H  . folic acid  1 mg Oral Daily  . furosemide  40 mg Intravenous BID  . gabapentin  600 mg Oral Daily  . gabapentin  900 mg Oral QHS  . isosorbide-hydrALAZINE  1 tablet Oral TID  . LORazepam  0-4 mg Intravenous Q6H   Or  . LORazepam  0-4 mg Oral Q6H  . [START ON 11/09/2020] LORazepam  0-4 mg Intravenous Q12H   Or  .  [START ON 11/09/2020] LORazepam  0-4 mg Oral Q12H  . multivitamin with minerals  1 tablet Oral Daily  . nicotine  14 mg Transdermal Daily  . pantoprazole  40 mg Oral Daily  . thiamine  100 mg Oral Daily   Or  . thiamine  100 mg Intravenous Daily   Continuous Infusions:   LOS: 1 day    Time spent: over 30 min    Fayrene Helper, MD [facility]   To contact the attending provider between 7A-7P or the covering provider during after hours 7P-7A, please log into the web site [email or web address] and access using universal [place] password for that web site. If you do not have the password, please call the hospital operator.  11/08/2020, 3:26 PM

## 2020-11-08 NOTE — Evaluation (Signed)
Physical Therapy Evaluation Patient Details Name: Brad [name] MRN: [id number] DOB: 1961-10-18 Today's Date: 11/08/2020   History of Present Illness  59 y.o. male admitted for acute on chronic hyponatremia, anasarca. PMH significant for T2DM, HTN, HLD, permanent A. Fib, chronic combined systolic and diastolic CHF, CKD stage III, polysubstance abuse, hep C, alcoholic liver cirrhosis, depression, GERD.  Pt with multiple admission within the last 6 months.  Clinical Impression  Pt admitted with above diagnosis.  Pt currently with functional limitations due to the deficits listed below (see PT Problem List). Pt will benefit from skilled PT to increase their independence and safety with mobility to allow discharge to the venue listed below.  Pt reports he lives with his roommate who "gives him a hard time" if he asks for assist.  Pt was able to ambulate with RW in hallway today however drifts to left (reports poor vision and needing glasses).  Pt with recent admission 2 weeks ago and PT recommended outpatient at that time.  Pt may benefit from HHPT upon d/c and especially f/u for optical needs.     Follow Up Recommendations Home health PT    Equipment Recommendations  None recommended by PT    Recommendations for Other Services       Precautions / Restrictions Precautions Precautions: Fall Restrictions Weight Bearing Restrictions: No      Mobility  Bed Mobility Overal bed mobility: Modified Independent             General bed mobility comments: increased time and effort, elevated HOB    Transfers Overall transfer level: Needs assistance Equipment used: Rolling walker (2 wheeled) Transfers: Sit to/from Stand Sit to Stand: Min guard         General transfer comment: min/guard for safety; pt reports increased weakness prior to admission  Ambulation/Gait Ambulation/Gait assistance: Min guard;Min assist Gait Distance (Feet): 120 Feet Assistive device: Rolling walker (2  wheeled) Gait Pattern/deviations: Step-through pattern;Drifts right/left     General Gait Details: pt drifts to left side (reports needing glasses) (seems to have had this difficulty with vision with admission 2 weeks ago), some assist for negotiating RW to keep from drifting to left; pt also reports mild dizziness which did not worsen or improve  Stairs            Wheelchair Mobility    Modified [name] (Stroke Patients Only)       Balance Overall balance assessment: Needs assistance         Standing balance support: During functional activity;Bilateral upper extremity supported Standing balance-Leahy Scale: Poor                               Pertinent Vitals/Pain Pain Assessment: Faces Faces Pain Scale: Hurts little more Pain Location: stomach Pain Descriptors / Indicators: Aching;Discomfort Pain Intervention(s): Repositioned;Monitored during session    Home Living Family/patient expects to be discharged to:: Private residence Living Arrangements: Non-relatives/Friends ("roommate") Available Help at Discharge: Friend(s);Available PRN/intermittently Type of Home: Apartment Home Access: Level entry     Home Layout: One level Home Equipment: Walker - 2 wheels;Cane - single point Additional Comments: Pt reports using SPC in community, spends time sitting on porch. Pt reports independent with cooking, cleaning, bathing dressing.    Prior Function Level of Independence: Independent with assistive device(s)               Hand Dominance   Dominant Hand: Right    Extremity/Trunk  Assessment        Lower Extremity Assessment Lower Extremity Assessment: Overall WFL for tasks assessed    Cervical / Trunk Assessment Cervical / Trunk Assessment: Normal  Communication   Communication: No difficulties  Cognition Arousal/Alertness: Awake/alert Behavior During Therapy: WFL for tasks assessed/performed Overall Cognitive Status: Within Functional  Limits for tasks assessed                                        General Comments      Exercises     Assessment/Plan    PT Assessment Patient needs continued PT services  PT Problem List Decreased balance;Decreased mobility;Decreased safety awareness       PT Treatment Interventions DME instruction;Gait training;Functional mobility training;Balance training;Neuromuscular re-education;Patient/family education;Therapeutic activities;Therapeutic exercise    PT Goals (Current goals can be found in the Care Plan section)  Acute Rehab PT Goals PT Goal Formulation: With patient Time For Goal Achievement: 11/22/20 Potential to Achieve Goals: Good    Frequency Min 3X/week   Barriers to discharge        Co-evaluation               AM-PAC PT "6 Clicks" Mobility  Outcome Measure Help needed turning from your back to your side while in a flat bed without using bedrails?: None Help needed moving from lying on your back to sitting on the side of a flat bed without using bedrails?: None Help needed moving to and from a bed to a chair (including a wheelchair)?: A Little Help needed standing up from a chair using your arms (e.g., wheelchair or bedside chair)?: A Little Help needed to walk in hospital room?: A Little Help needed climbing 3-5 steps with a railing? : A Little 6 Click Score: 20    End of Session Equipment Utilized During Treatment: Gait belt Activity Tolerance: Patient tolerated treatment well Patient left: in bed;with call bell/phone within reach;with bed alarm set Nurse Communication: Mobility status PT Visit Diagnosis: Unsteadiness on feet (R26.81);Other abnormalities of gait and mobility (R26.89)    Time: 4163-8453 PT Time Calculation (min) (ACUTE ONLY): 24 min   Charges:   PT Evaluation $PT Eval Low Complexity: 1 Low     Kati PT, DPT Acute Rehabilitation Services Pager: [phone] Office: [phone]   York Ram  E 11/08/2020, 1:45 PM

## 2020-11-08 NOTE — Progress Notes (Signed)
CRITICAL VALUE ALERT  Critical Value:  Sodium level 118  Date & Time Notied:  11/08/20 @ [place]  Provider Notified: Dr. Florene Glen   Orders Received/Actions taken: written

## 2020-11-09 DIAGNOSIS — E871 Hypo-osmolality and hyponatremia: Secondary | ICD-10-CM | POA: Diagnosis not present

## 2020-11-09 DIAGNOSIS — I5023 Acute on chronic systolic (congestive) heart failure: Secondary | ICD-10-CM | POA: Diagnosis not present

## 2020-11-09 LAB — RENAL FUNCTION PANEL
Albumin: 2.6 g/dL — ABNORMAL LOW (ref 3.5–5.0)
Albumin: 2.6 g/dL — ABNORMAL LOW (ref 3.5–5.0)
Albumin: 2.5 g/dL — ABNORMAL LOW (ref 3.5–5.0)
Anion gap: 8 (ref 5–15)
Anion gap: 10 (ref 5–15)
Anion gap: 9 (ref 5–15)
BUN: 18 mg/dL (ref 6–20)
BUN: 19 mg/dL (ref 6–20)
BUN: 17 mg/dL (ref 6–20)
CO2: 25 mmol/L (ref 22–32)
CO2: 23 mmol/L (ref 22–32)
CO2: 24 mmol/L (ref 22–32)
Calcium: 7.8 mg/dL — ABNORMAL LOW (ref 8.9–10.3)
Calcium: 7.8 mg/dL — ABNORMAL LOW (ref 8.9–10.3)
Calcium: 7.7 mg/dL — ABNORMAL LOW (ref 8.9–10.3)
Chloride: 86 mmol/L — ABNORMAL LOW (ref 98–111)
Chloride: 86 mmol/L — ABNORMAL LOW (ref 98–111)
Chloride: 89 mmol/L — ABNORMAL LOW (ref 98–111)
Creatinine, Ser: 1.46 mg/dL — ABNORMAL HIGH (ref 0.61–1.24)
Creatinine, Ser: 1.55 mg/dL — ABNORMAL HIGH (ref 0.61–1.24)
Creatinine, Ser: 1.43 mg/dL — ABNORMAL HIGH (ref 0.61–1.24)
GFR, Estimated: 55 mL/min — ABNORMAL LOW (ref >60)
GFR, Estimated: 51 mL/min — ABNORMAL LOW (ref >60)
GFR, Estimated: 56 mL/min — ABNORMAL LOW (ref >60)
Glucose, Bld: 148 mg/dL — ABNORMAL HIGH (ref 70–99)
Glucose, Bld: 166 mg/dL — ABNORMAL HIGH (ref 70–99)
Glucose, Bld: 167 mg/dL — ABNORMAL HIGH (ref 70–99)
Phosphorus: 3.3 mg/dL (ref 2.5–4.6)
Phosphorus: 3.5 mg/dL (ref 2.5–4.6)
Phosphorus: 3.4 mg/dL (ref 2.5–4.6)
Potassium: 3.9 mmol/L (ref 3.5–5.1)
Potassium: 4.2 mmol/L (ref 3.5–5.1)
Potassium: 3.9 mmol/L (ref 3.5–5.1)
Sodium: 119 mmol/L — CL (ref 135–145)
Sodium: 119 mmol/L — CL (ref 135–145)
Sodium: 122 mmol/L — ABNORMAL LOW (ref 135–145)

## 2020-11-09 LAB — COMPREHENSIVE METABOLIC PANEL
ALT: 19 U/L (ref 0–44)
AST: 28 U/L (ref 15–41)
Albumin: 2.4 g/dL — ABNORMAL LOW (ref 3.5–5.0)
Alkaline Phosphatase: 55 U/L (ref 38–126)
Anion gap: 7 (ref 5–15)
BUN: 19 mg/dL (ref 6–20)
CO2: 22 mmol/L (ref 22–32)
Calcium: 7.9 mg/dL — ABNORMAL LOW (ref 8.9–10.3)
Chloride: 87 mmol/L — ABNORMAL LOW (ref 98–111)
Creatinine, Ser: 1.5 mg/dL — ABNORMAL HIGH (ref 0.61–1.24)
GFR, Estimated: 53 mL/min — ABNORMAL LOW (ref >60)
Glucose, Bld: 135 mg/dL — ABNORMAL HIGH (ref 70–99)
Potassium: 4.1 mmol/L (ref 3.5–5.1)
Sodium: 116 mmol/L — CL (ref 135–145)
Total Bilirubin: 0.5 mg/dL (ref 0.3–1.2)
Total Protein: 5.1 g/dL — ABNORMAL LOW (ref 6.5–8.1)

## 2020-11-09 LAB — HEPATITIS PANEL, ACUTE
HCV Ab: REACTIVE — AB
Hep A IgM: NONREACTIVE
Hep B C IgM: NONREACTIVE
Hepatitis B Surface Ag: NONREACTIVE

## 2020-11-09 LAB — BASIC METABOLIC PANEL
Anion gap: 8 (ref 5–15)
BUN: 17 mg/dL (ref 6–20)
CO2: 24 mmol/L (ref 22–32)
Calcium: 7.9 mg/dL — ABNORMAL LOW (ref 8.9–10.3)
Chloride: 87 mmol/L — ABNORMAL LOW (ref 98–111)
Creatinine, Ser: 1.4 mg/dL — ABNORMAL HIGH (ref 0.61–1.24)
GFR, Estimated: 58 mL/min — ABNORMAL LOW (ref >60)
Glucose, Bld: 149 mg/dL — ABNORMAL HIGH (ref 70–99)
Potassium: 3.9 mmol/L (ref 3.5–5.1)
Sodium: 119 mmol/L — CL (ref 135–145)

## 2020-11-09 LAB — CBC WITH DIFFERENTIAL/PLATELET
Abs Immature Granulocytes: 0.01 10*3/uL (ref 0.00–0.07)
Basophils Absolute: 0 10*3/uL (ref 0.0–0.1)
Basophils Relative: 1 %
Eosinophils Absolute: 0.1 10*3/uL (ref 0.0–0.5)
Eosinophils Relative: 3 %
HCT: 32.3 % — ABNORMAL LOW (ref 39.0–52.0)
Hemoglobin: 10.5 g/dL — ABNORMAL LOW (ref 13.0–17.0)
Immature Granulocytes: 0 %
Lymphocytes Relative: 11 %
Lymphs Abs: 0.3 10*3/uL — ABNORMAL LOW (ref 0.7–4.0)
MCH: 28.2 pg (ref 26.0–34.0)
MCHC: 32.5 g/dL (ref 30.0–36.0)
MCV: 86.8 fL (ref 80.0–100.0)
Monocytes Absolute: 0.6 10*3/uL (ref 0.1–1.0)
Monocytes Relative: 19 %
Neutro Abs: 2.2 10*3/uL (ref 1.7–7.7)
Neutrophils Relative %: 66 %
Platelets: 209 10*3/uL (ref 150–400)
RBC: 3.72 MIL/uL — ABNORMAL LOW (ref 4.22–5.81)
RDW: 15.3 % (ref 11.5–15.5)
WBC: 3.2 10*3/uL — ABNORMAL LOW (ref 4.0–10.5)
nRBC: 0 % (ref 0.0–0.2)

## 2020-11-09 LAB — MAGNESIUM: Magnesium: 1.7 mg/dL (ref 1.7–2.4)

## 2020-11-09 LAB — PHOSPHORUS: Phosphorus: 3.2 mg/dL (ref 2.5–4.6)

## 2020-11-09 LAB — LIPASE, BLOOD: Lipase: 26 U/L (ref 11–51)

## 2020-11-09 MED ORDER — POLYETHYLENE GLYCOL 3350 17 G PO PACK
17.0000 g | PACK | Freq: Every day | ORAL | Status: DC
  Administered 2020-11-09 – 2020-11-10 (×2): 17 g via ORAL
  Filled 2020-11-09 (×2): qty 1

## 2020-11-09 MED ORDER — ISOSORB DINITRATE-HYDRALAZINE 20-37.5 MG PO TABS
1.0000 | ORAL_TABLET | Freq: Two times a day (BID) | ORAL | Status: DC
  Administered 2020-11-09 – 2020-11-13 (×9): 1 via ORAL
  Filled 2020-11-09 (×9): qty 1

## 2020-11-09 MED ORDER — TOLVAPTAN 15 MG PO TABS
15.0000 mg | ORAL_TABLET | Freq: Once | ORAL | Status: DC
  Filled 2020-11-09: qty 1

## 2020-11-09 MED ORDER — TOLVAPTAN 15 MG PO TABS
15.0000 mg | ORAL_TABLET | Freq: Once | ORAL | Status: AC
  Administered 2020-11-09: 15 mg via ORAL
  Filled 2020-11-09: qty 1

## 2020-11-09 MED ORDER — FUROSEMIDE 10 MG/ML IJ SOLN
80.0000 mg | Freq: Two times a day (BID) | INTRAMUSCULAR | Status: DC
  Administered 2020-11-09: 80 mg via INTRAVENOUS
  Filled 2020-11-09: qty 8

## 2020-11-09 NOTE — Progress Notes (Signed)
Progress Note  Patient Name: Brad [name] Date of Encounter: 11/09/2020  [facility] Cardiologist: [name], MD   Subjective   Main complaint is pain with urination and chronic arthritis pain.  Inpatient Medications    Scheduled Meds: . atorvastatin  20 mg Brad q1800  . DULoxetine  60 mg Brad Daily  . enoxaparin (LOVENOX) injection  40 mg Subcutaneous Q24H  . folic acid  1 mg Brad Daily  . furosemide  40 mg Intravenous BID  . gabapentin  600 mg Brad Daily  . gabapentin  900 mg Brad QHS  . isosorbide-hydrALAZINE  1 tablet Brad BID  . LORazepam  0-4 mg Intravenous Q12H   Or  . LORazepam  0-4 mg Brad Q12H  . multivitamin with minerals  1 tablet Brad Daily  . nicotine  14 mg Transdermal Daily  . pantoprazole  40 mg Brad Daily  . thiamine  100 mg Brad Daily   Or  . thiamine  100 mg Intravenous Daily   Continuous Infusions:  PRN Meds: acetaminophen **OR** acetaminophen, ondansetron **OR** ondansetron (ZOFRAN) IV, oxyCODONE   Vital Signs    Vitals:   11/08/20 1639 11/08/20 2203 11/09/20 0620 11/09/20 1236  BP: 110/60 113/75 109/84 124/83  Pulse: 64 63 66 65  Resp:   16   Temp: (!) 97.4 F (36.3 C) (!) 97.5 F (36.4 C) 98.1 F (36.7 C) (!) 97.5 F (36.4 C)  TempSrc: Brad Brad Brad Brad  SpO2: 100% 100% 99% 100%  Weight:   84.9 kg   Height:        Intake/Output Summary (Last 24 hours) at 11/09/2020 1328 Last data filed at 11/09/2020 1100 Gross per 24 hour  Intake 1180 ml  Output 2900 ml  Net -1720 ml   Last 3 Weights 11/09/2020 11/07/2020 11/07/2020  Weight (lbs) 187 lb 3.2 oz 175 lb 185 lb 3.7 oz  Weight (kg) 84.913 kg 79.379 kg 84.02 kg  Some encounter information is confidential and restricted. Go to Review Flowsheets activity to see all data.      Telemetry    Atrial fibrillation.  Rate <100 bpm.  - Personally Reviewed  ECG    n/a - Personally Reviewed  Physical Exam   VS:  BP 124/83 (BP Location: Right Arm)   Pulse 65   Temp  (!) 97.5 F (36.4 C) (Brad)   Resp 16   Ht 5\' 11"  (1.803 m)   Wt 84.9 kg   SpO2 100%   BMI 26.11 kg/m  , BMI Body mass index is 26.11 kg/m. GENERAL:  Chronically ill-appearing HEENT: Pupils equal round and reactive, fundi not visualized, Brad mucosa unremarkable NECK:  + jugular venous distention, waveform within normal limits, carotid upstroke brisk and symmetric, no bruits LUNGS:  Clear to auscultation bilaterally HEART:  Irregularly irregular.  PMI not displaced or sustained,S1 and S2 within normal limits, no S3, no S4, no clicks, no rubs, no murmurs ABD:  Flat, positive bowel sounds normal in frequency in pitch, no bruits, no rebound, no guarding, no midline pulsatile mass, no hepatomegaly, no splenomegaly EXT:  2 plus pulses throughout, 1+ LE edema, no cyanosis no clubbing SKIN:  No rashes no nodules NEURO:  Cranial nerves II through XII grossly intact, motor grossly intact throughout PSYCH:  Cognitively intact, oriented to person place and time   Labs    High Sensitivity Troponin:   Recent Labs  Lab 10/17/20 2024 10/17/20 2223 10/24/20 1850 10/24/20 2119  TROPONINIHS 36* 31* 27* 29*  Chemistry Recent Labs  Lab 11/07/20 0301 11/07/20 1049 11/08/20 1149 11/08/20 1716 11/08/20 2107 11/09/20 0504 11/09/20 0955  NA 116*   < >  --    < > 117* 116* 119*  119*  K 4.5   < >  --    < > 4.4 4.1 3.9  3.9  CL 84*   < >  --    < > 85* 87* 86*  87*  CO2 24   < >  --    < > 23 22 25  24   GLUCOSE 128*   < >  --    < > 164* 135* 148*  149*  BUN 12   < >  --    < > 20 19 18  17   CREATININE 1.14   < >  --    < > 1.42* 1.50* 1.46*  1.40*  CALCIUM 7.8*   < >  --    < > 7.7* 7.9* 7.8*  7.9*  PROT 5.9*  --  5.3*  --   --  5.1*  --   ALBUMIN 2.6*   < > 2.2*   < > 2.8* 2.4* 2.6*  AST 32  --  30  --   --  28  --   ALT 25  --  19  --   --  19  --   ALKPHOS 67  --  52  --   --  55  --   BILITOT 0.7  --  0.6  --   --  0.5  --   GFRNONAA >60   < >  --    < > 57* 53* 55*   58*  ANIONGAP 8   < >  --    < > 9 7 8  8    < > = values in this interval not displayed.     Hematology Recent Labs  Lab 11/07/20 1048 11/08/20 0318 11/09/20 0504  WBC 3.6* 5.6 3.2*  RBC 4.21* 3.97* 3.72*  HGB 12.0* 11.2* 10.5*  HCT 35.0* 33.6* 32.3*  MCV 83.1 84.6 86.8  MCH 28.5 28.2 28.2  MCHC 34.3 33.3 32.5  RDW 15.0 15.1 15.3  PLT 306 235 209    BNP Recent Labs  Lab 11/07/20 0300  BNP 567.2*     DDimer No results for input(s): DDIMER in the last 168 hours.   Radiology    No results found.  Cardiac Studies   Echo 07/2020: IMPRESSIONS   1. Left ventricular ejection fraction, by estimation, is 30 to 35%. The  left ventricle has moderately decreased function. The left ventricle  demonstrates global hypokinesis. The left ventricular internal cavity size  was mildly dilated. There is mild  left ventricular hypertrophy. Left ventricular diastolic parameters are  indeterminate.  2. Right ventricular systolic function is mildly reduced. The right  ventricular size is normal. There is moderately elevated pulmonary artery  systolic pressure. The estimated right ventricular systolic pressure is  82.9 mmHg.  3. Left atrial size was moderately dilated.  4. Right atrial size was moderately dilated.  5. The mitral valve is normal in structure. Moderate mitral valve  regurgitation, suspect functional. No evidence of mitral stenosis.  6. The aortic valve is tricuspid. Aortic valve regurgitation is mild. No  aortic stenosis is present.  7. The inferior vena cava is dilated in size with <50% respiratory  variability, suggesting right atrial pressure of 15 mmHg.  8. The patient was in atrial fibrillation.  Patient Profile     59 y.o. male with chronic systolic and diastolic heart failure, nonischemic cardiomyopathy, atrial flutter, cirrhosis, diabetes, and polysubstance abuse admitted with acute on chronic heart failure.  Assessment & Plan    #Acute on  chronic systolic diastolic heart failure: #Moderate pulmonary hypertension: #Mild RV dysfunction: # Hyponatremia: # Hypertension:  LVEF 30 to 35%.  Echo this admission also showed moderate mitral regurgitation, thought to be functional.  Right atrial pressure was 15 mmHg.  Thus far diuresis has been a bit slow.  He is net -1 L since admission.  Sodium levels remain low in the 110s.  We will increase his Lasix to 80 mg twice daily.  Agree with giving a dose of tolvaptan.  He is needed to use his with the advanced heart failure service on admission in the past.  Blood pressure is low.  We will reduce BiDil to twice daily.  If his blood pressure improved and renal function is stable, would add back spironolactone.  # Atrial fibrillation/flutter:  Rates are controlled.  He is not on anticoagulation due to falls and alcohol abuse.  Not on beta blocker 2/2 bradycardia.  #Alcohol abuse: Continues to drink alcohol.  Needs rehab.  #PVCs: Previously on amiodarone, but this was discontinued this admission due to cirrhosis  # Syncope:  Per Dr. Aundra Dubin unclear whether arrhythmia or EtOH.  Not ICD candidate 2/2 substance abuse.        For questions or updates, please contact [place] Please consult [email or web address] for contact info under        Signed, Skeet Latch, MD  11/09/2020, 1:28 PM

## 2020-11-09 NOTE — Progress Notes (Signed)
CRITICAL VALUE ALERT  Critical Value:  Sodium level - 119  Date & Time Notied:  11/09/20 @ 1756  Provider Notified: Dr. Florene Glen  Orders Received/Actions taken: None

## 2020-11-09 NOTE — Progress Notes (Signed)
PROGRESS NOTE    Brad [name]  XHB:[id number] DOB: Dec 02, 1961 DOA: 11/07/2020 PCP: Charlott Rakes, MD  Chief Complaint  Patient presents with  . Abdominal Pain  . Diarrhea   Brief Narrative:  Brad [name] is Brad [name] 59 y.o. male with medical history significant of HFrEF, Neuropathy, Brad [name], EtOH abuse. Presenting with stomach pain for the last 2 days. He reports it's in the RLQ and LLQ. It's Brad [name] throbbing pain that is intermittent in nature. It has been accompanied by diarrhea. He took an unspecified medicine to help, but it provided no relief. He also reports Brad [name] couple of falls in the last 2 days. He denies head injury or LOC. He says he simply got weak and fell to the ground. He became concerned and came to the ED.    ED Course: CT ab pelvis obtained. Lab work showed hyponatremia. He was given lasix and TRH called for admission.   Assessment & Plan:   Active Problems:   CHF, acute on chronic (HCC)  Acute on chronic hypnatremia  Hypervolemic Hyponatremia     - continue lasix for volume overload     - fluid restriction      - Na 116 on 11/17, correct gradually     - 116 this AM ~ 5 am -> will repeat now, consider tolvaptan if similar/not improving     - low urine sodium     - renal recommended cardiology c/s in setting of his heart failure - appreciate assistance     - CT ab/pelvis shows anasarca      - q6h BMP  Anasarca  Volume Overload  Acute on Chronic Systolic Heart Failure Exacerbation     - continue diuresis as noted above     - echo 8/11 with EF 30-35% (see report)     - Continue bidil     - appreciate cardiology recommendations   Proctitis  Diarrhea  Abdominal Discomfort     - infectious vs inflammatory     - follow C diff/GI pathogen panel     - abdominal discomfort initially improved, now worsening again -> check bladder scan, consider repeat imaging if persistent     - hold off on abx for now  Cirrhosis      - low albumin, normal platelets, bilirubin,  INR      - CT findings with changes c/w portal colopathy and portal gastritis      - ? 2/2 hx etoh use - needs outpatient follow up - follow acute hepatitis panel  EtOH abuse Hx of cocaine abuse Tobacco abuse     - CIWAA; thiamine, B12, folate     - check thiamine (pending) and B12 (wnl) levels     - check UDS -> positive for cocaine      - nicotine patch     - counseled against further EtOh/tobacco use  Neuropathy     - continue cymbalta, neurontin  Brad [name] [name]     - will d/c amiodarone in setting of cirrhosis, discussed with cards  DVT prophylaxis: lovenox Code Status: full  Family Communication: none at bedside Disposition:   Status is: Inpatient  Remains inpatient appropriate because:Inpatient level of care appropriate due to severity of illness   Dispo: The patient is from: Home              Anticipated d/c is to: Home              Anticipated d/c date is: > 3 days  Patient currently is not medically stable to d/c.   Consultants:   Cardiology  Renal    Procedures:   none  Antimicrobials:  Anti-infectives (From admission, onward)   None         Subjective: C/o abdominal discomfort  Objective: Vitals:   11/08/20 1311 11/08/20 1639 11/08/20 2203 11/09/20 0620  BP: 101/66 110/60 113/75 109/84  Pulse: 67 64 63 66  Resp: 14   16  Temp: 97.6 F (36.4 C) (!) 97.4 F (36.3 C) (!) 97.5 F (36.4 C) 98.1 F (36.7 C)  TempSrc: Oral Oral Oral Oral  SpO2: 97% 100% 100% 99%  Weight:    84.9 kg  Height:        Intake/Output Summary (Last 24 hours) at 11/09/2020 0847 Last data filed at 11/09/2020 0624 Gross per 24 hour  Intake 700 ml  Output 2100 ml  Net -1400 ml   Filed Weights   11/07/20 0204 11/07/20 0505 11/09/20 0620  Weight: 84 kg 79.4 kg 84.9 kg    Examination:  General: No acute distress. Cardiovascular: Heart sounds show Brad [name] regular rate, and rhythm. +JVD. Lungs: Clear to auscultation bilaterally  Abdomen: Soft, mild diffuse  TTP Neurological: Alert and oriented 3. Moves all extremities 4 . Cranial nerves II through XII grossly intact. Skin: Warm and dry. No rashes or lesions. Extremities: bilateral LE edema   Data Reviewed: I have personally reviewed following labs and imaging studies  CBC: Recent Labs  Lab 11/07/20 0301 11/07/20 1048 11/08/20 0318 11/09/20 0504  WBC 3.7* 3.6* 5.6 3.2*  NEUTROABS 2.9  --   --  2.2  HGB 12.3* 12.0* 11.2* 10.5*  HCT 36.3* 35.0* 33.6* 32.3*  MCV 83.4 83.1 84.6 86.8  PLT 339 306 235 941    Basic Metabolic Panel: Recent Labs  Lab 11/08/20 0318 11/08/20 0933 11/08/20 1716 11/08/20 2107 11/09/20 0504  NA 119* 122* 118* 117* 116*  K 4.7 4.1 4.1 4.4 4.1  CL 87* 88* 86* 85* 87*  CO2 22 22 24 23 22   GLUCOSE 104* 99 168* 164* 135*  BUN 17 18 20 20 19   CREATININE 1.35* 1.31* 1.45* 1.42* 1.50*  CALCIUM 7.6* 8.2* 7.6* 7.7* 7.9*  MG  --   --   --   --  1.7  PHOS 3.6 3.9 3.4 3.5 3.2    GFR: Estimated Creatinine Clearance: 55.8 mL/min (Brad [name]) (by C-G formula based on SCr of 1.5 mg/dL (H)).  Liver Function Tests: Recent Labs  Lab 11/07/20 0301 11/07/20 1049 11/08/20 0933 11/08/20 1149 11/08/20 1716 11/08/20 2107 11/09/20 0504  AST 32  --   --  30  --   --  28  ALT 25  --   --  19  --   --  19  ALKPHOS 67  --   --  52  --   --  55  BILITOT 0.7  --   --  0.6  --   --  0.5  PROT 5.9*  --   --  5.3*  --   --  5.1*  ALBUMIN 2.6*   < > 2.6* 2.2* 2.3* 2.8* 2.4*   < > = values in this interval not displayed.    CBG: No results for input(s): GLUCAP in the last 168 hours.   Recent Results (from the past 240 hour(s))  Respiratory Panel by RT PCR (Flu Brad [name], Covid) - Nasopharyngeal Swab     Status: None   Collection Time: 11/07/20  4:10 AM  Specimen: Nasopharyngeal Swab  Result Value Ref Range Status   SARS Coronavirus 2 by RT PCR NEGATIVE NEGATIVE Final    Comment: (NOTE) SARS-CoV-2 target nucleic acids are NOT DETECTED.  The SARS-CoV-2 RNA is generally  detectable in upper respiratoy specimens during the acute phase of infection. The lowest concentration of SARS-CoV-2 viral copies this assay can detect is 131 copies/mL. Brad [name] negative result does not preclude SARS-Cov-2 infection and should not be used as the sole basis for treatment or other patient management decisions. Zaakirah Kistner negative result may occur with  improper specimen collection/handling, submission of specimen other than nasopharyngeal swab, presence of viral mutation(s) within the areas targeted by this assay, and inadequate number of viral copies (<131 copies/mL). Sire Poet negative result must be combined with clinical observations, patient history, and epidemiological information. The expected result is Negative.  Fact Sheet for Patients:  PinkCheek.be  Fact Sheet for Healthcare Providers:  GravelBags.it  This test is no t yet approved or cleared by the [place] FDA and  has been authorized for detection and/or diagnosis of SARS-CoV-2 by FDA under an Emergency Use Authorization (EUA). This EUA will remain  in effect (meaning this test can be used) for the duration of the COVID-19 declaration under Section 564(b)(1) of the Act, 21 U.S.C. section 360bbb-3(b)(1), unless the authorization is terminated or revoked sooner.     Influenza Regine Christian by PCR NEGATIVE NEGATIVE Final   Influenza B by PCR NEGATIVE NEGATIVE Final    Comment: (NOTE) The Xpert Xpress SARS-CoV-2/FLU/RSV assay is intended as an aid in  the diagnosis of influenza from Nasopharyngeal swab specimens and  should not be used as Sayward Horvath sole basis for treatment. Nasal washings and  aspirates are unacceptable for Xpert Xpress SARS-CoV-2/FLU/RSV  testing.  Fact Sheet for Patients: PinkCheek.be  Fact Sheet for Healthcare Providers: GravelBags.it  This test is not yet approved or cleared by the [place] FDA and    has been authorized for detection and/or diagnosis of SARS-CoV-2 by  FDA under an Emergency Use Authorization (EUA). This EUA will remain  in effect (meaning this test can be used) for the duration of the  Covid-19 declaration under Section 564(b)(1) of the Act, 21  U.S.C. section 360bbb-3(b)(1), unless the authorization is  terminated or revoked. Performed at [facility], [place] [address], [place], [place] [address]          Radiology Studies: No results found.      Scheduled Meds: . atorvastatin  20 mg Oral q1800  . DULoxetine  60 mg Oral Daily  . enoxaparin (LOVENOX) injection  40 mg Subcutaneous Q24H  . folic acid  1 mg Oral Daily  . furosemide  40 mg Intravenous BID  . gabapentin  600 mg Oral Daily  . gabapentin  900 mg Oral QHS  . isosorbide-hydrALAZINE  1 tablet Oral TID  . LORazepam  0-4 mg Intravenous Q12H   Or  . LORazepam  0-4 mg Oral Q12H  . multivitamin with minerals  1 tablet Oral Daily  . nicotine  14 mg Transdermal Daily  . pantoprazole  40 mg Oral Daily  . thiamine  100 mg Oral Daily   Or  . thiamine  100 mg Intravenous Daily   Continuous Infusions:   LOS: 2 days    Time spent: over 30 min    Fayrene Helper, MD [facility]   To contact the attending provider between 7A-7P or the covering provider during after hours 7P-7A, please log into the web  site [email or web address] and access using universal [place] password for that web site. If you do not have the password, please call the hospital operator.  11/09/2020, 8:47 AM

## 2020-11-09 NOTE — Plan of Care (Signed)
  Problem: Education: Goal: Knowledge of General Education information will improve Description Including pain rating scale, medication(s)/side effects and non-pharmacologic comfort measures Outcome: Progressing   Problem: Health Behavior/Discharge Planning: Goal: Ability to manage health-related needs will improve Outcome: Progressing   Problem: Clinical Measurements: Goal: Ability to maintain clinical measurements within normal limits will improve Outcome: Progressing Goal: Will remain free from infection Outcome: Progressing Goal: Diagnostic test results will improve Outcome: Progressing Goal: Respiratory complications will improve Outcome: Progressing Goal: Cardiovascular complication will be avoided Outcome: Progressing   Problem: Activity: Goal: Risk for activity intolerance will decrease Outcome: Progressing   Problem: Nutrition: Goal: Adequate nutrition will be maintained Outcome: Progressing   Problem: Coping: Goal: Level of anxiety will decrease Outcome: Progressing   Problem: Elimination: Goal: Will not experience complications related to bowel motility Outcome: Progressing Goal: Will not experience complications related to urinary retention Outcome: Progressing   Problem: Pain Managment: Goal: General experience of comfort will improve Outcome: Progressing   Problem: Safety: Goal: Ability to remain free from injury will improve Outcome: Progressing   Problem: Skin Integrity: Goal: Risk for impaired skin integrity will decrease Outcome: Progressing   Problem: Education: Goal: Knowledge of disease or condition will improve Outcome: Progressing Goal: Understanding of discharge needs will improve Outcome: Progressing   Problem: Health Behavior/Discharge Planning: Goal: Ability to identify changes in lifestyle to reduce recurrence of condition will improve Outcome: Progressing Goal: Identification of resources available to assist in meeting health  care needs will improve Outcome: Progressing   Problem: Physical Regulation: Goal: Complications related to the disease process, condition or treatment will be avoided or minimized Outcome: Progressing   Problem: Safety: Goal: Ability to remain free from injury will improve Outcome: Progressing   

## 2020-11-10 ENCOUNTER — Inpatient Hospital Stay (HOSPITAL_COMMUNITY): Payer: Medicaid Other

## 2020-11-10 DIAGNOSIS — I4891 Unspecified atrial fibrillation: Secondary | ICD-10-CM | POA: Diagnosis not present

## 2020-11-10 DIAGNOSIS — I5043 Acute on chronic combined systolic (congestive) and diastolic (congestive) heart failure: Secondary | ICD-10-CM | POA: Diagnosis not present

## 2020-11-10 DIAGNOSIS — E871 Hypo-osmolality and hyponatremia: Secondary | ICD-10-CM | POA: Diagnosis not present

## 2020-11-10 LAB — RENAL FUNCTION PANEL
Albumin: 2.4 g/dL — ABNORMAL LOW (ref 3.5–5.0)
Albumin: 2.4 g/dL — ABNORMAL LOW (ref 3.5–5.0)
Albumin: 2.4 g/dL — ABNORMAL LOW (ref 3.5–5.0)
Albumin: 2.6 g/dL — ABNORMAL LOW (ref 3.5–5.0)
Anion gap: 8 (ref 5–15)
Anion gap: 9 (ref 5–15)
Anion gap: 7 (ref 5–15)
Anion gap: 10 (ref 5–15)
BUN: 17 mg/dL (ref 6–20)
BUN: 18 mg/dL (ref 6–20)
BUN: 16 mg/dL (ref 6–20)
BUN: 16 mg/dL (ref 6–20)
CO2: 26 mmol/L (ref 22–32)
CO2: 27 mmol/L (ref 22–32)
CO2: 26 mmol/L (ref 22–32)
CO2: 27 mmol/L (ref 22–32)
Calcium: 7.9 mg/dL — ABNORMAL LOW (ref 8.9–10.3)
Calcium: 7.9 mg/dL — ABNORMAL LOW (ref 8.9–10.3)
Calcium: 7.7 mg/dL — ABNORMAL LOW (ref 8.9–10.3)
Calcium: 8.1 mg/dL — ABNORMAL LOW (ref 8.9–10.3)
Chloride: 91 mmol/L — ABNORMAL LOW (ref 98–111)
Chloride: 91 mmol/L — ABNORMAL LOW (ref 98–111)
Chloride: 95 mmol/L — ABNORMAL LOW (ref 98–111)
Chloride: 93 mmol/L — ABNORMAL LOW (ref 98–111)
Creatinine, Ser: 1.32 mg/dL — ABNORMAL HIGH (ref 0.61–1.24)
Creatinine, Ser: 1.39 mg/dL — ABNORMAL HIGH (ref 0.61–1.24)
Creatinine, Ser: 1.36 mg/dL — ABNORMAL HIGH (ref 0.61–1.24)
Creatinine, Ser: 1.41 mg/dL — ABNORMAL HIGH (ref 0.61–1.24)
GFR, Estimated: 58 mL/min — ABNORMAL LOW (ref >60)
GFR, Estimated: >60 mL/min (ref >60)
GFR, Estimated: 60 mL/min — ABNORMAL LOW (ref >60)
GFR, Estimated: 57 mL/min — ABNORMAL LOW (ref >60)
Glucose, Bld: 148 mg/dL — ABNORMAL HIGH (ref 70–99)
Glucose, Bld: 149 mg/dL — ABNORMAL HIGH (ref 70–99)
Glucose, Bld: 179 mg/dL — ABNORMAL HIGH (ref 70–99)
Glucose, Bld: 151 mg/dL — ABNORMAL HIGH (ref 70–99)
Phosphorus: 3.4 mg/dL (ref 2.5–4.6)
Phosphorus: 3.5 mg/dL (ref 2.5–4.6)
Phosphorus: 3.2 mg/dL (ref 2.5–4.6)
Phosphorus: 3.4 mg/dL (ref 2.5–4.6)
Potassium: 3.6 mmol/L (ref 3.5–5.1)
Potassium: 3.8 mmol/L (ref 3.5–5.1)
Potassium: 3.8 mmol/L (ref 3.5–5.1)
Potassium: 3.9 mmol/L (ref 3.5–5.1)
Sodium: 126 mmol/L — ABNORMAL LOW (ref 135–145)
Sodium: 126 mmol/L — ABNORMAL LOW (ref 135–145)
Sodium: 128 mmol/L — ABNORMAL LOW (ref 135–145)
Sodium: 130 mmol/L — ABNORMAL LOW (ref 135–145)

## 2020-11-10 LAB — COMPREHENSIVE METABOLIC PANEL
ALT: 18 U/L (ref 0–44)
AST: 26 U/L (ref 15–41)
Albumin: 2.4 g/dL — ABNORMAL LOW (ref 3.5–5.0)
Alkaline Phosphatase: 56 U/L (ref 38–126)
Anion gap: 9 (ref 5–15)
BUN: 18 mg/dL (ref 6–20)
CO2: 27 mmol/L (ref 22–32)
Calcium: 7.8 mg/dL — ABNORMAL LOW (ref 8.9–10.3)
Chloride: 90 mmol/L — ABNORMAL LOW (ref 98–111)
Creatinine, Ser: 1.36 mg/dL — ABNORMAL HIGH (ref 0.61–1.24)
GFR, Estimated: 60 mL/min — ABNORMAL LOW (ref >60)
Glucose, Bld: 148 mg/dL — ABNORMAL HIGH (ref 70–99)
Potassium: 3.7 mmol/L (ref 3.5–5.1)
Sodium: 126 mmol/L — ABNORMAL LOW (ref 135–145)
Total Bilirubin: 0.4 mg/dL (ref 0.3–1.2)
Total Protein: 4.9 g/dL — ABNORMAL LOW (ref 6.5–8.1)

## 2020-11-10 LAB — PHOSPHORUS: Phosphorus: 3.4 mg/dL (ref 2.5–4.6)

## 2020-11-10 LAB — CBC WITH DIFFERENTIAL/PLATELET
Abs Immature Granulocytes: 0.01 10*3/uL (ref 0.00–0.07)
Basophils Absolute: 0 10*3/uL (ref 0.0–0.1)
Basophils Relative: 1 %
Eosinophils Absolute: 0.1 10*3/uL (ref 0.0–0.5)
Eosinophils Relative: 2 %
HCT: 30.6 % — ABNORMAL LOW (ref 39.0–52.0)
Hemoglobin: 10.1 g/dL — ABNORMAL LOW (ref 13.0–17.0)
Immature Granulocytes: 0 %
Lymphocytes Relative: 6 %
Lymphs Abs: 0.2 10*3/uL — ABNORMAL LOW (ref 0.7–4.0)
MCH: 28 pg (ref 26.0–34.0)
MCHC: 33 g/dL (ref 30.0–36.0)
MCV: 84.8 fL (ref 80.0–100.0)
Monocytes Absolute: 0.6 10*3/uL (ref 0.1–1.0)
Monocytes Relative: 19 %
Neutro Abs: 2.3 10*3/uL (ref 1.7–7.7)
Neutrophils Relative %: 72 %
Platelets: 197 10*3/uL (ref 150–400)
RBC: 3.61 MIL/uL — ABNORMAL LOW (ref 4.22–5.81)
RDW: 15 % (ref 11.5–15.5)
WBC: 3.2 10*3/uL — ABNORMAL LOW (ref 4.0–10.5)
nRBC: 0 % (ref 0.0–0.2)

## 2020-11-10 LAB — MAGNESIUM: Magnesium: 1.6 mg/dL — ABNORMAL LOW (ref 1.7–2.4)

## 2020-11-10 LAB — HCV RNA QUANT
HCV Quantitative Log: 5.862 log10 IU/mL (ref >1.70)
HCV Quantitative: 727000 IU/mL (ref >50)

## 2020-11-10 MED ORDER — OXYCODONE HCL 5 MG PO TABS
10.0000 mg | ORAL_TABLET | ORAL | Status: DC | PRN
  Administered 2020-11-10 – 2020-11-13 (×14): 10 mg via ORAL
  Filled 2020-11-10 (×15): qty 2

## 2020-11-10 MED ORDER — POLYETHYLENE GLYCOL 3350 17 G PO PACK
17.0000 g | PACK | Freq: Two times a day (BID) | ORAL | Status: DC
  Administered 2020-11-10 – 2020-11-13 (×4): 17 g via ORAL
  Filled 2020-11-10 (×6): qty 1

## 2020-11-10 MED ORDER — FUROSEMIDE 10 MG/ML IJ SOLN
80.0000 mg | Freq: Every day | INTRAMUSCULAR | Status: DC
  Administered 2020-11-10: 80 mg via INTRAVENOUS
  Filled 2020-11-10: qty 8

## 2020-11-10 MED ORDER — OXYCODONE HCL 5 MG PO TABS: 5.0000 mg | ORAL_TABLET | ORAL | Status: DC | PRN

## 2020-11-10 NOTE — Progress Notes (Addendum)
Progress Note  Patient Name: Boston Cookson Date of Encounter: 11/10/2020  [facility] Cardiologist: [name], MD   Subjective    Reports dyspnea improved.  Net-3.2 L yesterday.  Sodium improving (116 > 119 >122 > 126).  Creatinine improved from 1.5-1.4.  BP stable 115/82, SPO2 98% on room air  Inpatient Medications    Scheduled Meds: . atorvastatin  20 mg Oral q1800  . DULoxetine  60 mg Oral Daily  . enoxaparin (LOVENOX) injection  40 mg Subcutaneous Q24H  . folic acid  1 mg Oral Daily  . furosemide  80 mg Intravenous BID  . gabapentin  600 mg Oral Daily  . gabapentin  900 mg Oral QHS  . isosorbide-hydrALAZINE  1 tablet Oral BID  . LORazepam  0-4 mg Intravenous Q12H   Or  . LORazepam  0-4 mg Oral Q12H  . multivitamin with minerals  1 tablet Oral Daily  . nicotine  14 mg Transdermal Daily  . pantoprazole  40 mg Oral Daily  . polyethylene glycol  17 g Oral Daily  . thiamine  100 mg Oral Daily   Or  . thiamine  100 mg Intravenous Daily   Continuous Infusions:  PRN Meds: acetaminophen **OR** acetaminophen, ondansetron **OR** ondansetron (ZOFRAN) IV, oxyCODONE   Vital Signs    Vitals:   11/09/20 1236 11/09/20 2137 11/10/20 0542 11/10/20 0728  BP: 124/83 111/77 115/82   Pulse: 65 64 67   Resp:   18   Temp: (!) 97.5 F (36.4 C) (!) 97.4 F (36.3 C) (!) 97.5 F (36.4 C)   TempSrc: Oral Oral Oral   SpO2: 100% 99% 98%   Weight:    81.4 kg  Height:        Intake/Output Summary (Last 24 hours) at 11/10/2020 0816 Last data filed at 11/10/2020 8676 Gross per 24 hour  Intake 1140 ml  Output 4350 ml  Net -3210 ml   Last 3 Weights 11/10/2020 11/09/2020 11/07/2020  Weight (lbs) 179 lb 6.4 oz 187 lb 3.2 oz 175 lb  Weight (kg) 81.375 kg 84.913 kg 79.379 kg  Some encounter information is confidential and restricted. Go to Review Flowsheets activity to see all data.      Telemetry    Atrial fibrillation.  Rate 60-70s.  - Personally Reviewed  ECG      n/a - Personally Reviewed  Physical Exam   VS:  BP 115/82 (BP Location: Right Arm)   Pulse 67   Temp (!) 97.5 F (36.4 C) (Oral)   Resp 18   Ht 5\' 11"  (1.803 m)   Wt 81.4 kg   SpO2 98%   BMI 25.02 kg/m  , BMI Body mass index is 25.02 kg/m. GEN: in no acute distress HEENT: normal Neck: + JVD Cardiac: irregular, normal rate, no murmurs Respiratory:  clear to auscultation bilaterally, normal work of breathing GI: soft, nontender, nondistended, + BS MS: 1+ BLE edema Skin: warm and dry, no rash Neuro:  Alert and Oriented x 3 Psych: normal affect    Labs    High Sensitivity Troponin:   Recent Labs  Lab 10/17/20 2024 10/17/20 2223 10/24/20 1850 10/24/20 2119  TROPONINIHS 36* 31* 27* 29*      Chemistry Recent Labs  Lab 11/08/20 1149 11/08/20 1716 11/09/20 0504 11/09/20 0955 11/09/20 1534 11/09/20 2205 11/10/20 0521  NA  --    < > 116*   < > 119* 122* 126*  126*  126*  K  --    < >  4.1   < > 4.2 3.9 3.6  3.7  3.8  CL  --    < > 87*   < > 86* 89* 91*  90*  91*  CO2  --    < > 22   < > 23 24 26  27  27   GLUCOSE  --    < > 135*   < > 166* 167* 148*  148*  149*  BUN  --    < > 19   < > 19 17 18  18  17   CREATININE  --    < > 1.50*   < > 1.55* 1.43* 1.39*  1.36*  1.32*  CALCIUM  --    < > 7.9*   < > 7.8* 7.7* 7.9*  7.8*  7.9*  PROT 5.3*  --  5.1*  --   --   --  4.9*  ALBUMIN 2.2*   < > 2.4*   < > 2.6* 2.5* 2.4*  2.4*  2.4*  AST 30  --  28  --   --   --  26  ALT 19  --  19  --   --   --  18  ALKPHOS 52  --  55  --   --   --  56  BILITOT 0.6  --  0.5  --   --   --  0.4  GFRNONAA  --    < > 53*   < > 51* 56* 58*  60*  >60  ANIONGAP  --    < > 7   < > 10 9 9  9  8    < > = values in this interval not displayed.     Hematology Recent Labs  Lab 11/08/20 0318 11/09/20 0504 11/10/20 0521  WBC 5.6 3.2* 3.2*  RBC 3.97* 3.72* 3.61*  HGB 11.2* 10.5* 10.1*  HCT 33.6* 32.3* 30.6*  MCV 84.6 86.8 84.8  MCH 28.2 28.2 28.0  MCHC 33.3 32.5 33.0   RDW 15.1 15.3 15.0  PLT 235 209 197    BNP Recent Labs  Lab 11/07/20 0300  BNP 567.2*     DDimer No results for input(s): DDIMER in the last 168 hours.   Radiology    No results found.  Cardiac Studies   Echo 07/2020: IMPRESSIONS   1. Left ventricular ejection fraction, by estimation, is 30 to 35%. The  left ventricle has moderately decreased function. The left ventricle  demonstrates global hypokinesis. The left ventricular internal cavity size  was mildly dilated. There is mild  left ventricular hypertrophy. Left ventricular diastolic parameters are  indeterminate.  2. Right ventricular systolic function is mildly reduced. The right  ventricular size is normal. There is moderately elevated pulmonary artery  systolic pressure. The estimated right ventricular systolic pressure is  87.8 mmHg.  3. Left atrial size was moderately dilated.  4. Right atrial size was moderately dilated.  5. The mitral valve is normal in structure. Moderate mitral valve  regurgitation, suspect functional. No evidence of mitral stenosis.  6. The aortic valve is tricuspid. Aortic valve regurgitation is mild. No  aortic stenosis is present.  7. The inferior vena cava is dilated in size with <50% respiratory  variability, suggesting right atrial pressure of 15 mmHg.  8. The patient was in atrial fibrillation.   Patient Profile     59 y.o. male with chronic systolic and diastolic heart failure, nonischemic cardiomyopathy, atrial flutter, cirrhosis, diabetes, and polysubstance abuse  admitted with acute on chronic heart failure.  Assessment & Plan    #Acute on chronic systolic diastolic heart failure: #Moderate pulmonary hypertension: #Mild RV dysfunction: # Hyponatremia: # Hypertension:  LVEF 30 to 35% from echo 08/01/20.   Given 1 dose of tolvaptan yesterday for hyponatremia.  Diuresed well yesterday on Lasix 40 mg x1 and 80 mg x1, renal function and hyponatremia improving.  Has had  somewhat rapid increase in sodium (116 to 126 over the last 24 hours), would hold off on further tolvaptan and decrease lasix to 80 mg once daily today.  Continue to closely monitor sodium, will want to avoid correcting too quickly.  Would have low threshold to involve nephrology.  # Atrial fibrillation/flutter:  Rates are controlled.  He is not on anticoagulation due to falls and alcohol abuse.  Not on beta blocker 2/2 bradycardia.  #Alcohol abuse: Continues to drink alcohol.  Needs rehab.  #PVCs: Previously on amiodarone, but this was discontinued this admission due to cirrhosis  # Syncope:  Per Dr. Aundra Dubin unclear whether arrhythmia or EtOH.  Not ICD candidate 2/2 substance abuse.       For questions or updates, please contact [place] Please consult [email or web address] for contact info under        Signed, Donato Heinz, MD  11/10/2020, 8:16 AM

## 2020-11-10 NOTE — Progress Notes (Signed)
PROGRESS NOTE    Brad [name]  EGB:[id number] DOB: 07/29/1961 DOA: 11/07/2020 PCP: Charlott Rakes, MD  Chief Complaint  Patient presents with  . Abdominal Pain  . Diarrhea   Brief Narrative:  Brad [name] is Brad [name] 59 y.o. male with medical history significant of HFrEF, Neuropathy, Brad [name] fib, EtOH abuse. Presenting with stomach pain for the last 2 days. He reports it's in the RLQ and LLQ. It's Brad [name] throbbing pain that is intermittent in nature. It has been accompanied by diarrhea. He took an unspecified medicine to help, but it provided no relief. He also reports Brad [name] couple of falls in the last 2 days. He denies head injury or LOC. He says he simply got weak and fell to the ground. He became concerned and came to the ED.    ED Course: CT ab pelvis obtained. Lab work showed hyponatremia. He was given lasix and TRH called for admission.   He's being treated for heart failure with volume overload c/b hyponatremia.    Assessment & Plan:   Active Problems:   CHF, acute on chronic (HCC)  Acute on chronic hypnatremia  Hypervolemic Hyponatremia     - continue lasix for volume overload, appreciate cardiology assistance     - fluid restriction      - continue gradual correction (lasix x1 today with relatively fast correction in past 24 hrs)     - s/p tolvaptan 11/19 for hypervolemic hyponatremia     - low urine sodium     - renal recommended cardiology c/s in setting of his heart failure - appreciate assistance     - CT ab/pelvis shows anasarca      - q6h BMP  Anasarca  Volume Overload  Acute on Chronic Systolic Heart Failure Exacerbation     - continue diuresis as noted above     - echo 8/11 with EF 30-35% (see report)     - Continue bidil     - appreciate cardiology recommendations   Proctitis  Diarrhea  Abdominal Discomfort     - infectious vs inflammatory      - follow C diff/GI pathogen panel - he's not having diarrhea     - has some intermittent urinary retention likely  exacerbating his abdominal discomfort - insert indwelling if he requires additional I/O cath     - hold off on abx for now     - [place] without ascites  Urinary Retention      - q6 bladder scan, s/p I/O cath --- may need indwelling if recurrent >300   Cirrhosis      - low albumin, normal platelets, bilirubin, INR      - CT findings with changes c/w portal colopathy and portal gastritis      - ? 2/2 hx etoh use - needs outpatient follow up - follow acute hepatitis panel -> hepatitis C ab positive, follow PCR  EtOH abuse Hx of cocaine abuse Tobacco abuse     - CIWAA; thiamine, B12, folate     - check thiamine (pending) and B12 (wnl) levels     - check UDS -> positive for cocaine      - nicotine patch     - counseled against further EtOh/tobacco use  Neuropathy     - continue cymbalta, neurontin  Brad [name] fib     - will d/c amiodarone in setting of cirrhosis, discussed with cards  DVT prophylaxis: lovenox Code Status: full  Family Communication: none at bedside Disposition:   Status  is: Inpatient  Remains inpatient appropriate because:Inpatient level of care appropriate due to severity of illness   Dispo: The patient is from: Home              Anticipated d/c is to: Home              Anticipated d/c date is: > 3 days              Patient currently is not medically stable to d/c.   Consultants:   Cardiology  Renal    Procedures:   none  Antimicrobials:  Anti-infectives (From admission, onward)   None         Subjective: Abdominal discomfort improved  Objective: Vitals:   11/09/20 2137 11/10/20 0542 11/10/20 0728 11/10/20 1525  BP: 111/77 115/82  (!) 103/58  Pulse: 64 67  65  Resp:  18  10  Temp: (!) 97.4 F (36.3 C) (!) 97.5 F (36.4 C)  98.2 F (36.8 C)  TempSrc: Oral Oral  Oral  SpO2: 99% 98%  98%  Weight:   81.4 kg   Height:        Intake/Output Summary (Last 24 hours) at 11/10/2020 1854 Last data filed at 11/10/2020 1707 Gross per 24 hour    Intake 780 ml  Output 4750 ml  Net -3970 ml   Filed Weights   11/07/20 0505 11/09/20 0620 11/10/20 0728  Weight: 79.4 kg 84.9 kg 81.4 kg    Examination:  General: No acute distress. Cardiovascular: Heart sounds show Aneisa Karren regular rate, and rhythm Lungs: Clear to auscultation bilaterally. Abdomen: Soft, mildly distended, mild abdominal discomfort Neurological: Alert and oriented 3. Moves all extremities 4 with equal strength. Cranial nerves II through XII grossly intact. Skin: Warm and dry. No rashes or lesions. Extremities: anasarca     Data Reviewed: I have personally reviewed following labs and imaging studies  CBC: Recent Labs  Lab 11/07/20 0301 11/07/20 1048 11/08/20 0318 11/09/20 0504 11/10/20 0521  WBC 3.7* 3.6* 5.6 3.2* 3.2*  NEUTROABS 2.9  --   --  2.2 2.3  HGB 12.3* 12.0* 11.2* 10.5* 10.1*  HCT 36.3* 35.0* 33.6* 32.3* 30.6*  MCV 83.4 83.1 84.6 86.8 84.8  PLT 339 306 235 209 191    Basic Metabolic Panel: Recent Labs  Lab 11/09/20 0504 11/09/20 0504 11/09/20 0955 11/09/20 1534 11/09/20 2205 11/10/20 0521 11/10/20 1531  NA 116*   < > 119*  119* 119* 122* 126*  126*  126* 128*  K 4.1   < > 3.9  3.9 4.2 3.9 3.6  3.7  3.8 3.8  CL 87*   < > 86*  87* 86* 89* 91*  90*  91* 95*  CO2 22   < > 25  24 23 24 26  27  27 26   GLUCOSE 135*   < > 148*  149* 166* 167* 148*  148*  149* 179*  BUN 19   < > 18  17 19 17 18  18  17 16   CREATININE 1.50*   < > 1.46*  1.40* 1.55* 1.43* 1.39*  1.36*  1.32* 1.36*  CALCIUM 7.9*   < > 7.8*  7.9* 7.8* 7.7* 7.9*  7.8*  7.9* 7.7*  MG 1.7  --   --   --   --  1.6*  --   PHOS 3.2   < > 3.3 3.5 3.4 3.5  3.4  3.4 3.2   < > = values in this interval not displayed.  GFR: Estimated Creatinine Clearance: 61.5 mL/min (Tasheena Wambolt) (by C-G formula based on SCr of 1.36 mg/dL (H)).  Liver Function Tests: Recent Labs  Lab 11/07/20 0301 11/07/20 1049 11/08/20 1149 11/08/20 1716 11/09/20 0504 11/09/20 0504  11/09/20 0955 11/09/20 1534 11/09/20 2205 11/10/20 0521 11/10/20 1531  AST 32  --  30  --  28  --   --   --   --  26  --   ALT 25  --  19  --  19  --   --   --   --  18  --   ALKPHOS 67  --  52  --  55  --   --   --   --  56  --   BILITOT 0.7  --  0.6  --  0.5  --   --   --   --  0.4  --   PROT 5.9*  --  5.3*  --  5.1*  --   --   --   --  4.9*  --   ALBUMIN 2.6*   < > 2.2*   < > 2.4*   < > 2.6* 2.6* 2.5* 2.4*  2.4*  2.4* 2.4*   < > = values in this interval not displayed.    CBG: No results for input(s): GLUCAP in the last 168 hours.   Recent Results (from the past 240 hour(s))  Respiratory Panel by RT PCR (Flu Rhylee Nunn&B, Covid) - Nasopharyngeal Swab     Status: None   Collection Time: 11/07/20  4:10 AM   Specimen: Nasopharyngeal Swab  Result Value Ref Range Status   SARS Coronavirus 2 by RT PCR NEGATIVE NEGATIVE Final    Comment: (NOTE) SARS-CoV-2 target nucleic acids are NOT DETECTED.  The SARS-CoV-2 RNA is generally detectable in upper respiratoy specimens during the acute phase of infection. The lowest concentration of SARS-CoV-2 viral copies this assay can detect is 131 copies/mL. Tou Hayner negative result does not preclude SARS-Cov-2 infection and should not be used as the sole basis for treatment or other patient management decisions. Yitzchak Kothari negative result may occur with  improper specimen collection/handling, submission of specimen other than nasopharyngeal swab, presence of viral mutation(s) within the areas targeted by this assay, and inadequate number of viral copies (<131 copies/mL). Pam Vanalstine negative result must be combined with clinical observations, patient history, and epidemiological information. The expected result is Negative.  Fact Sheet for Patients:  PinkCheek.be  Fact Sheet for Healthcare Providers:  GravelBags.it  This test is no t yet approved or cleared by the [place] FDA and  has been authorized for  detection and/or diagnosis of SARS-CoV-2 by FDA under an Emergency Use Authorization (EUA). This EUA will remain  in effect (meaning this test can be used) for the duration of the COVID-19 declaration under Section 564(b)(1) of the Act, 21 U.S.C. section 360bbb-3(b)(1), unless the authorization is terminated or revoked sooner.     Influenza Ciaran Begay by PCR NEGATIVE NEGATIVE Final   Influenza B by PCR NEGATIVE NEGATIVE Final    Comment: (NOTE) The Xpert Xpress SARS-CoV-2/FLU/RSV assay is intended as an aid in  the diagnosis of influenza from Nasopharyngeal swab specimens and  should not be used as Breanna Mcdaniel sole basis for treatment. Nasal washings and  aspirates are unacceptable for Xpert Xpress SARS-CoV-2/FLU/RSV  testing.  Fact Sheet for Patients: PinkCheek.be  Fact Sheet for Healthcare Providers: GravelBags.it  This test is not yet approved or cleared by the [place] and  has been authorized for detection and/or diagnosis of SARS-CoV-2 by  FDA under an Emergency Use Authorization (EUA). This EUA will remain  in effect (meaning this test can be used) for the duration of the  Covid-19 declaration under Section 564(b)(1) of the Act, 21  U.S.C. section 360bbb-3(b)(1), unless the authorization is  terminated or revoked. Performed at [facility], [place] [address] [place], [place] [address]          Radiology Studies: [place] ASCITES (ABDOMEN LIMITED)  Result Date: 11/10/2020 CLINICAL DATA:  Abdominal distension EXAM: LIMITED ABDOMEN ULTRASOUND FOR ASCITES TECHNIQUE: Limited ultrasound survey for ascites was performed in all four abdominal quadrants. COMPARISON:  None. FINDINGS: Four quadrant examination of the abdomen demonstrates no ascites. Liver has Tyrian Peart lobular contour. RIGHT effusion noted. IMPRESSION: 1. No ascites. 2. Lobular liver. 3. RIGHT effusion. Electronically Signed   By: Suzy Bouchard M.D.   On:  11/10/2020 15:28        Scheduled Meds: . atorvastatin  20 mg Oral q1800  . DULoxetine  60 mg Oral Daily  . enoxaparin (LOVENOX) injection  40 mg Subcutaneous Q24H  . folic acid  1 mg Oral Daily  . furosemide  80 mg Intravenous Daily  . gabapentin  600 mg Oral Daily  . gabapentin  900 mg Oral QHS  . isosorbide-hydrALAZINE  1 tablet Oral BID  . LORazepam  0-4 mg Intravenous Q12H   Or  . LORazepam  0-4 mg Oral Q12H  . multivitamin with minerals  1 tablet Oral Daily  . nicotine  14 mg Transdermal Daily  . pantoprazole  40 mg Oral Daily  . polyethylene glycol  17 g Oral Daily  . thiamine  100 mg Oral Daily   Or  . thiamine  100 mg Intravenous Daily   Continuous Infusions:   LOS: 3 days    Time spent: over 30 min    Fayrene Helper, MD [facility]   To contact the attending provider between 7A-7P or the covering provider during after hours 7P-7A, please log into the web site [email or web address] and access using universal [place] password for that web site. If you do not have the password, please call the hospital operator.  11/10/2020, 6:54 PM

## 2020-11-10 NOTE — Progress Notes (Signed)
Pt bladder scan at this time as ordered. Obtained greater than 378. 1450 yellow urine emptied from condom cath. Pt refuses I&O at this time till morning.

## 2020-11-11 DIAGNOSIS — I5043 Acute on chronic combined systolic (congestive) and diastolic (congestive) heart failure: Secondary | ICD-10-CM | POA: Diagnosis not present

## 2020-11-11 DIAGNOSIS — I4891 Unspecified atrial fibrillation: Secondary | ICD-10-CM | POA: Diagnosis not present

## 2020-11-11 DIAGNOSIS — D649 Anemia, unspecified: Secondary | ICD-10-CM

## 2020-11-11 DIAGNOSIS — E871 Hypo-osmolality and hyponatremia: Secondary | ICD-10-CM | POA: Diagnosis not present

## 2020-11-11 LAB — RENAL FUNCTION PANEL
Albumin: 2.4 g/dL — ABNORMAL LOW (ref 3.5–5.0)
Albumin: 2.6 g/dL — ABNORMAL LOW (ref 3.5–5.0)
Anion gap: 8 (ref 5–15)
Anion gap: 5 (ref 5–15)
BUN: 17 mg/dL (ref 6–20)
BUN: 18 mg/dL (ref 6–20)
CO2: 29 mmol/L (ref 22–32)
CO2: 29 mmol/L (ref 22–32)
Calcium: 8 mg/dL — ABNORMAL LOW (ref 8.9–10.3)
Calcium: 8.1 mg/dL — ABNORMAL LOW (ref 8.9–10.3)
Chloride: 95 mmol/L — ABNORMAL LOW (ref 98–111)
Chloride: 95 mmol/L — ABNORMAL LOW (ref 98–111)
Creatinine, Ser: 1.43 mg/dL — ABNORMAL HIGH (ref 0.61–1.24)
Creatinine, Ser: 1.22 mg/dL (ref 0.61–1.24)
GFR, Estimated: 56 mL/min — ABNORMAL LOW (ref >60)
GFR, Estimated: >60 mL/min (ref >60)
Glucose, Bld: 147 mg/dL — ABNORMAL HIGH (ref 70–99)
Glucose, Bld: 134 mg/dL — ABNORMAL HIGH (ref 70–99)
Phosphorus: 3.3 mg/dL (ref 2.5–4.6)
Phosphorus: 3.3 mg/dL (ref 2.5–4.6)
Potassium: 3.9 mmol/L (ref 3.5–5.1)
Potassium: 3.8 mmol/L (ref 3.5–5.1)
Sodium: 132 mmol/L — ABNORMAL LOW (ref 135–145)
Sodium: 129 mmol/L — ABNORMAL LOW (ref 135–145)

## 2020-11-11 LAB — COMPREHENSIVE METABOLIC PANEL
ALT: 18 U/L (ref 0–44)
AST: 25 U/L (ref 15–41)
Albumin: 2.4 g/dL — ABNORMAL LOW (ref 3.5–5.0)
Alkaline Phosphatase: 57 U/L (ref 38–126)
Anion gap: 8 (ref 5–15)
BUN: 17 mg/dL (ref 6–20)
CO2: 29 mmol/L (ref 22–32)
Calcium: 8 mg/dL — ABNORMAL LOW (ref 8.9–10.3)
Chloride: 95 mmol/L — ABNORMAL LOW (ref 98–111)
Creatinine, Ser: 1.31 mg/dL — ABNORMAL HIGH (ref 0.61–1.24)
GFR, Estimated: >60 mL/min (ref >60)
Glucose, Bld: 148 mg/dL — ABNORMAL HIGH (ref 70–99)
Potassium: 3.9 mmol/L (ref 3.5–5.1)
Sodium: 132 mmol/L — ABNORMAL LOW (ref 135–145)
Total Bilirubin: 0.3 mg/dL (ref 0.3–1.2)
Total Protein: 5.3 g/dL — ABNORMAL LOW (ref 6.5–8.1)

## 2020-11-11 LAB — PHOSPHORUS: Phosphorus: 3.2 mg/dL (ref 2.5–4.6)

## 2020-11-11 LAB — URINALYSIS, ROUTINE W REFLEX MICROSCOPIC
Bacteria, UA: NONE SEEN
Bilirubin Urine: NEGATIVE
Glucose, UA: NEGATIVE mg/dL
Hgb urine dipstick: NEGATIVE
Ketones, ur: NEGATIVE mg/dL
Leukocytes,Ua: NEGATIVE
Nitrite: NEGATIVE
Protein, ur: 30 mg/dL — AB
Specific Gravity, Urine: 1.008 (ref 1.005–1.030)
pH: 6 (ref 5.0–8.0)

## 2020-11-11 LAB — CBC WITH DIFFERENTIAL/PLATELET
Abs Immature Granulocytes: 0.01 10*3/uL (ref 0.00–0.07)
Basophils Absolute: 0 10*3/uL (ref 0.0–0.1)
Basophils Relative: 1 %
Eosinophils Absolute: 0.1 10*3/uL (ref 0.0–0.5)
Eosinophils Relative: 3 %
HCT: 29.9 % — ABNORMAL LOW (ref 39.0–52.0)
Hemoglobin: 9.6 g/dL — ABNORMAL LOW (ref 13.0–17.0)
Immature Granulocytes: 0 %
Lymphocytes Relative: 8 %
Lymphs Abs: 0.3 10*3/uL — ABNORMAL LOW (ref 0.7–4.0)
MCH: 28 pg (ref 26.0–34.0)
MCHC: 32.1 g/dL (ref 30.0–36.0)
MCV: 87.2 fL (ref 80.0–100.0)
Monocytes Absolute: 0.7 10*3/uL (ref 0.1–1.0)
Monocytes Relative: 19 %
Neutro Abs: 2.7 10*3/uL (ref 1.7–7.7)
Neutrophils Relative %: 69 %
Platelets: 202 10*3/uL (ref 150–400)
RBC: 3.43 MIL/uL — ABNORMAL LOW (ref 4.22–5.81)
RDW: 15.5 % (ref 11.5–15.5)
WBC: 3.8 10*3/uL — ABNORMAL LOW (ref 4.0–10.5)
nRBC: 0 % (ref 0.0–0.2)

## 2020-11-11 LAB — MAGNESIUM: Magnesium: 1.6 mg/dL — ABNORMAL LOW (ref 1.7–2.4)

## 2020-11-11 MED ORDER — FUROSEMIDE 10 MG/ML IJ SOLN
40.0000 mg | Freq: Every day | INTRAMUSCULAR | Status: DC
  Administered 2020-11-11 – 2020-11-12 (×2): 40 mg via INTRAVENOUS
  Filled 2020-11-11 (×2): qty 4

## 2020-11-11 NOTE — Progress Notes (Signed)
PROGRESS NOTE    Brad [name]  ZHG:[id number] DOB: 01-30-1961 DOA: 11/07/2020 PCP: Charlott Rakes, MD  Chief Complaint  Patient presents with  . Abdominal Pain  . Diarrhea   Brief Narrative:  Brad [name] is a 59 y.o. male with medical history significant of HFrEF, Neuropathy, a fib, EtOH abuse. Presenting with stomach pain for the last 2 days. He reports it's in the RLQ and LLQ. It's a throbbing pain that is intermittent in nature. It has been accompanied by diarrhea. He took an unspecified medicine to help, but it provided no relief. He also reports a couple of falls in the last 2 days. He denies head injury or LOC. He says he simply got weak and fell to the ground. He became concerned and came to the ED.    ED Course: CT ab pelvis obtained. Lab work showed hyponatremia. He was given lasix and TRH called for admission.   Patient continues to be treated for CHF exacerbation and hyponatremia.  Assessment & Plan:   Acute on chronic hypnatremia  Hypervolemic Hyponatremia Patient was started on intravenous furosemide.  Patient was also given tolvaptan on 11/19.  Patient had rapid correction and sodium level.  Dose of Lasix was decreased.  Tolvaptan was discontinued.  His hyponatremia is most likely due to hypovolemia.  Continue to just hold furosemide for now.  Monitor sodium levels closely.  Acute on chronic systolic CHF/anasarca Patient diuresing well.  Monitor ins and outs and daily weights.  Echocardiogram from August showed EF to be 30 to 35%.  Mitral regurgitation was also noted.  Elevated pulmonary artery pressures were seen.  Cardiology is following.  Patient is on intravenous diuretics.  Dose was reduced today.  Patient also on BiDil.  Not noted to be on a beta-blocker due to history of significant bradycardia.  History of atrial fibrillation After discussions with cardiology amiodarone was discontinued due to presence of liver cirrhosis.   Reasonably well controlled.   Patient not noted to be on anticoagulation due to his alcoholism history of frequent falls.   Proctitis  Diarrhea  Abdominal Discomfort Patient continues to have pain in his lower abdomen.  He had a CT scan of his abdomen pelvis which raised concern for proctitis.  He also had diarrhea.  This appears to have subsided.  Stool studies were ordered but not sent since his diarrhea subsided.  He also noticed some discomfort with urination however UA was unremarkable.  He had apparently some urinary retention as well.  Will check bladder scan.  UA to be repeated.  CT scan did not show any abnormalities in the GU tract.  Urinary Retention Bladder scan this morning showed urine volume of 267 mL.  Patient has been able to void without any difficulty.   Liver cirrhosis/hepatitis C CT findings showed changes consistent with portal colopathy and portal gastritis.  Patient was counseled on his alcohol intake.  Hepatitis panel that showed a reactive hepatitis C antibody.  HCV quantitative is 727,000.  Will need outpatient follow-up with infectious disease.    EtOH abuse/Hx of cocaine abuse/Tobacco abuse Patient counseled.  B12 level 305.  Noted to be 160 back in July. Thiamine level pending.  Urine drug screen positive for cocaine.  Counseled.  Nicotine patch.  Neuropathy Continue cymbalta, neurontin  Normocytic anemia/Leukopenia Hemoglobin is stable.  Likely due to chronic disease.  No evidence for overt blood loss.  Chronic kidney disease stage IIIa Renal function seems to be close to baseline.   DVT prophylaxis: lovenox  Code Status: full  Family Communication: none at bedside Disposition:   Status is: Inpatient  Remains inpatient appropriate because:Inpatient level of care appropriate due to severity of illness   Dispo: The patient is from: Home              Anticipated d/c is to: Home              Anticipated d/c date is: > 3 days              Patient currently is not medically stable  to d/c.   Consultants:   Cardiology  Nephrology   Procedures:   none  Antimicrobials:  Anti-infectives (From admission, onward)   None         Subjective: Patient mentions that he continues to have abdominal discomfort but denies any nausea vomiting.  Some discomfort with urination at times.  No diarrhea currently.  Objective: Vitals:   11/10/20 2106 11/10/20 2206 11/11/20 0606 11/11/20 0919  BP: 114/74  122/88 (!) 113/99  Pulse: (!) 53 71 72 71  Resp: 16  17   Temp: 98.3 F (36.8 C)  98.4 F (36.9 C)   TempSrc: Oral  Oral   SpO2: 100%  97% 99%  Weight:   80.6 kg   Height:        Intake/Output Summary (Last 24 hours) at 11/11/2020 1030 Last data filed at 11/11/2020 0914 Gross per 24 hour  Intake 1090 ml  Output 4325 ml  Net -3235 ml   Filed Weights   11/09/20 0620 11/10/20 0728 11/11/20 0606  Weight: 84.9 kg 81.4 kg 80.6 kg    Examination:  General appearance: Awake alert.  In no distress Resp: Improved effort.  Few crackles bilateral bases.  No wheezing or rhonchi. Cardio: S1-S2 is normal regular.  No S3-S4.  No rubs murmurs or bruit GI: Abdomen is soft.  Tenderness noted in the lower abdomen in the suprapubic area as well as the left lower quadrant without any rebound rigidity or guarding.  No masses organomegaly. Extremities: 1+ edema noted bilateral lower extremities. Neurologic: Alert and oriented x3.  No focal neurological deficits.      Data Reviewed: I have personally reviewed following labs and imaging studies  CBC: Recent Labs  Lab 11/07/20 0301 11/07/20 0301 11/07/20 1048 11/08/20 0318 11/09/20 0504 11/10/20 0521 11/11/20 0422  WBC 3.7*   < > 3.6* 5.6 3.2* 3.2* 3.8*  NEUTROABS 2.9  --   --   --  2.2 2.3 2.7  HGB 12.3*   < > 12.0* 11.2* 10.5* 10.1* 9.6*  HCT 36.3*   < > 35.0* 33.6* 32.3* 30.6* 29.9*  MCV 83.4   < > 83.1 84.6 86.8 84.8 87.2  PLT 339   < > 306 235 209 197 202   < > = values in this interval not displayed.     Basic Metabolic Panel: Recent Labs  Lab 11/09/20 0504 11/09/20 0955 11/10/20 0521 11/10/20 1531 11/10/20 2146 11/11/20 0422 11/11/20 0909  NA 116*   < > 126*  126*  126* 128* 130* 132*  132* 129*  K 4.1   < > 3.6  3.7  3.8 3.8 3.9 3.9  3.9 3.8  CL 87*   < > 91*  90*  91* 95* 93* 95*  95* 95*  CO2 22   < > 26  27  27 26 27 29  29 29   GLUCOSE 135*   < > 148*  148*  149* 179* 151*  147*  148* 134*  BUN 19   < > 18  18  17 16 16 17  17 18   CREATININE 1.50*   < > 1.39*  1.36*  1.32* 1.36* 1.41* 1.43*  1.31* 1.22  CALCIUM 7.9*   < > 7.9*  7.8*  7.9* 7.7* 8.1* 8.0*  8.0* 8.1*  MG 1.7  --  1.6*  --   --  1.6*  --   PHOS 3.2   < > 3.5  3.4  3.4 3.2 3.4 3.3  3.2 3.3   < > = values in this interval not displayed.    GFR: Estimated Creatinine Clearance: 68.6 mL/min (by C-G formula based on SCr of 1.22 mg/dL).  Liver Function Tests: Recent Labs  Lab 11/07/20 0301 11/07/20 1049 11/08/20 1149 11/08/20 1716 11/09/20 0504 11/09/20 0955 11/10/20 0521 11/10/20 1531 11/10/20 2146 11/11/20 0422 11/11/20 0909  AST 32  --  30  --  28  --  26  --   --  25  --   ALT 25  --  19  --  19  --  18  --   --  18  --   ALKPHOS 67  --  52  --  55  --  56  --   --  57  --   BILITOT 0.7  --  0.6  --  0.5  --  0.4  --   --  0.3  --   PROT 5.9*  --  5.3*  --  5.1*  --  4.9*  --   --  5.3*  --   ALBUMIN 2.6*   < > 2.2*   < > 2.4*   < > 2.4*  2.4*  2.4* 2.4* 2.6* 2.4*  2.4* 2.6*   < > = values in this interval not displayed.    Recent Results (from the past 240 hour(s))  Respiratory Panel by RT PCR (Flu A&B, Covid) - Nasopharyngeal Swab     Status: None   Collection Time: 11/07/20  4:10 AM   Specimen: Nasopharyngeal Swab  Result Value Ref Range Status   SARS Coronavirus 2 by RT PCR NEGATIVE NEGATIVE Final    Comment: (NOTE) SARS-CoV-2 target nucleic acids are NOT DETECTED.  The SARS-CoV-2 RNA is generally detectable in upper respiratoy specimens during the acute  phase of infection. The lowest concentration of SARS-CoV-2 viral copies this assay can detect is 131 copies/mL. A negative result does not preclude SARS-Cov-2 infection and should not be used as the sole basis for treatment or other patient management decisions. A negative result may occur with  improper specimen collection/handling, submission of specimen other than nasopharyngeal swab, presence of viral mutation(s) within the areas targeted by this assay, and inadequate number of viral copies (<131 copies/mL). A negative result must be combined with clinical observations, patient history, and epidemiological information. The expected result is Negative.  Fact Sheet for Patients:  PinkCheek.be  Fact Sheet for Healthcare Providers:  GravelBags.it  This test is no t yet approved or cleared by the [place] FDA and  has been authorized for detection and/or diagnosis of SARS-CoV-2 by FDA under an Emergency Use Authorization (EUA). This EUA will remain  in effect (meaning this test can be used) for the duration of the COVID-19 declaration under Section 564(b)(1) of the Act, 21 U.S.C. section 360bbb-3(b)(1), unless the authorization is terminated or revoked sooner.     Influenza A by PCR NEGATIVE NEGATIVE Final   Influenza B by  PCR NEGATIVE NEGATIVE Final    Comment: (NOTE) The Xpert Xpress SARS-CoV-2/FLU/RSV assay is intended as an aid in  the diagnosis of influenza from Nasopharyngeal swab specimens and  should not be used as a sole basis for treatment. Nasal washings and  aspirates are unacceptable for Xpert Xpress SARS-CoV-2/FLU/RSV  testing.  Fact Sheet for Patients: PinkCheek.be  Fact Sheet for Healthcare Providers: GravelBags.it  This test is not yet approved or cleared by the [place] FDA and  has been authorized for detection and/or diagnosis of  SARS-CoV-2 by  FDA under an Emergency Use Authorization (EUA). This EUA will remain  in effect (meaning this test can be used) for the duration of the  Covid-19 declaration under Section 564(b)(1) of the Act, 21  U.S.C. section 360bbb-3(b)(1), unless the authorization is  terminated or revoked. Performed at [facility], [place] [address] [place], [place] [address]          Radiology Studies: [place] ASCITES (ABDOMEN LIMITED)  Result Date: 11/10/2020 CLINICAL DATA:  Abdominal distension EXAM: LIMITED ABDOMEN ULTRASOUND FOR ASCITES TECHNIQUE: Limited ultrasound survey for ascites was performed in all four abdominal quadrants. COMPARISON:  None. FINDINGS: Four quadrant examination of the abdomen demonstrates no ascites. Liver has a lobular contour. RIGHT effusion noted. IMPRESSION: 1. No ascites. 2. Lobular liver. 3. RIGHT effusion. Electronically Signed   By: Suzy Bouchard M.D.   On: 11/10/2020 15:28        Scheduled Meds: . atorvastatin  20 mg Oral q1800  . DULoxetine  60 mg Oral Daily  . folic acid  1 mg Oral Daily  . furosemide  40 mg Intravenous Daily  . gabapentin  600 mg Oral Daily  . gabapentin  900 mg Oral QHS  . isosorbide-hydrALAZINE  1 tablet Oral BID  . multivitamin with minerals  1 tablet Oral Daily  . nicotine  14 mg Transdermal Daily  . pantoprazole  40 mg Oral Daily  . polyethylene glycol  17 g Oral BID  . thiamine  100 mg Oral Daily   Or  . thiamine  100 mg Intravenous Daily   Continuous Infusions:   LOS: 4 days     Bonnielee Haff, MD [facility]   To contact the attending provider between 7A-7P or the covering provider during after hours 7P-7A, please log into the web site [email or web address] and access using universal [place] password for that web site. If you do not have the password, please call the hospital operator.  11/11/2020, 10:30 AM

## 2020-11-11 NOTE — Plan of Care (Signed)
  Problem: Education: Goal: Knowledge of General Education information will improve Description Including pain rating scale, medication(s)/side effects and non-pharmacologic comfort measures Outcome: Progressing   Problem: Health Behavior/Discharge Planning: Goal: Ability to manage health-related needs will improve Outcome: Progressing   Problem: Clinical Measurements: Goal: Ability to maintain clinical measurements within normal limits will improve Outcome: Progressing Goal: Will remain free from infection Outcome: Progressing Goal: Diagnostic test results will improve Outcome: Progressing Goal: Respiratory complications will improve Outcome: Progressing Goal: Cardiovascular complication will be avoided Outcome: Progressing   Problem: Activity: Goal: Risk for activity intolerance will decrease Outcome: Progressing   Problem: Nutrition: Goal: Adequate nutrition will be maintained Outcome: Progressing   Problem: Coping: Goal: Level of anxiety will decrease Outcome: Progressing   Problem: Elimination: Goal: Will not experience complications related to bowel motility Outcome: Progressing Goal: Will not experience complications related to urinary retention Outcome: Progressing   Problem: Pain Managment: Goal: General experience of comfort will improve Outcome: Progressing   Problem: Safety: Goal: Ability to remain free from injury will improve Outcome: Progressing   Problem: Skin Integrity: Goal: Risk for impaired skin integrity will decrease Outcome: Progressing   Problem: Education: Goal: Knowledge of disease or condition will improve Outcome: Progressing Goal: Understanding of discharge needs will improve Outcome: Progressing   Problem: Health Behavior/Discharge Planning: Goal: Ability to identify changes in lifestyle to reduce recurrence of condition will improve Outcome: Progressing Goal: Identification of resources available to assist in meeting health  care needs will improve Outcome: Progressing   Problem: Physical Regulation: Goal: Complications related to the disease process, condition or treatment will be avoided or minimized Outcome: Progressing   Problem: Safety: Goal: Ability to remain free from injury will improve Outcome: Progressing   

## 2020-11-11 NOTE — Progress Notes (Signed)
Progress Note  Patient Name: Brad [name] Date of Encounter: 11/11/2020  [facility] Cardiologist: [name], MD   Subjective    Reports dyspnea improved.  Net-3.3 L yesterday.  Sodium continue to improve (116 > 126 > 132).  Creatinine stable (1.5>1.4>1.4).  BP stable 122/88, SPO2 97% on room air  Inpatient Medications    Scheduled Meds: . atorvastatin  20 mg Oral q1800  . DULoxetine  60 mg Oral Daily  . folic acid  1 mg Oral Daily  . furosemide  80 mg Intravenous Daily  . gabapentin  600 mg Oral Daily  . gabapentin  900 mg Oral QHS  . isosorbide-hydrALAZINE  1 tablet Oral BID  . multivitamin with minerals  1 tablet Oral Daily  . nicotine  14 mg Transdermal Daily  . pantoprazole  40 mg Oral Daily  . polyethylene glycol  17 g Oral BID  . thiamine  100 mg Oral Daily   Or  . thiamine  100 mg Intravenous Daily   Continuous Infusions:  PRN Meds: acetaminophen **OR** acetaminophen, ondansetron **OR** ondansetron (ZOFRAN) IV, oxyCODONE **OR** oxyCODONE   Vital Signs    Vitals:   11/10/20 1525 11/10/20 2106 11/10/20 2206 11/11/20 0606  BP: (!) 103/58 114/74  122/88  Pulse: 65 (!) 53 71 72  Resp: 10 16  17   Temp: 98.2 F (36.8 C) 98.3 F (36.8 C)  98.4 F (36.9 C)  TempSrc: Oral Oral  Oral  SpO2: 98% 100%  97%  Weight:    80.6 kg  Height:        Intake/Output Summary (Last 24 hours) at 11/11/2020 0912 Last data filed at 11/11/2020 0657 Gross per 24 hour  Intake 970 ml  Output 4325 ml  Net -3355 ml   Last 3 Weights 11/11/2020 11/10/2020 11/09/2020  Weight (lbs) 177 lb 9.6 oz 179 lb 6.4 oz 187 lb 3.2 oz  Weight (kg) 80.559 kg 81.375 kg 84.913 kg  Some encounter information is confidential and restricted. Go to Review Flowsheets activity to see all data.      Telemetry    Atrial fibrillation.  Rate 60-70s.  - Personally Reviewed  ECG    n/a - Personally Reviewed  Physical Exam   VS:  BP 122/88 (BP Location: Right Arm)   Pulse 72   Temp  98.4 F (36.9 C) (Oral)   Resp 17   Ht 5\' 11"  (1.803 m)   Wt 80.6 kg   SpO2 97%   BMI 24.77 kg/m  , BMI Body mass index is 24.77 kg/m. GEN: in no acute distress HEENT: normal Neck: + JVD Cardiac: irregular, normal rate, no murmurs Respiratory:  clear to auscultation bilaterally, normal work of breathing GI: soft, nontender, nondistended, + BS MS: trace BLE edema Skin: warm and dry, no rash Neuro:  Alert and Oriented x 3 Psych: normal affect    Labs    High Sensitivity Troponin:   Recent Labs  Lab 10/17/20 2024 10/17/20 2223 10/24/20 1850 10/24/20 2119  TROPONINIHS 36* 31* 27* 29*      Chemistry Recent Labs  Lab 11/09/20 0504 11/09/20 0955 11/10/20 0521 11/10/20 0521 11/10/20 1531 11/10/20 2146 11/11/20 0422  NA 116*   < > 126*  126*  126*   < > 128* 130* 132*  132*  K 4.1   < > 3.6  3.7  3.8   < > 3.8 3.9 3.9  3.9  CL 87*   < > 91*  90*  91*   < >  95* 93* 95*  95*  CO2 22   < > 26  27  27    < > 26 27 29  29   GLUCOSE 135*   < > 148*  148*  149*   < > 179* 151* 147*  148*  BUN 19   < > 18  18  17    < > 16 16 17  17   CREATININE 1.50*   < > 1.39*  1.36*  1.32*   < > 1.36* 1.41* 1.43*  1.31*  CALCIUM 7.9*   < > 7.9*  7.8*  7.9*   < > 7.7* 8.1* 8.0*  8.0*  PROT 5.1*  --  4.9*  --   --   --  5.3*  ALBUMIN 2.4*   < > 2.4*  2.4*  2.4*   < > 2.4* 2.6* 2.4*  2.4*  AST 28  --  26  --   --   --  25  ALT 19  --  18  --   --   --  18  ALKPHOS 55  --  56  --   --   --  57  BILITOT 0.5  --  0.4  --   --   --  0.3  GFRNONAA 53*   < > 58*  60*  >60   < > 60* 57* 56*  >60  ANIONGAP 7   < > 9  9  8    < > 7 10 8  8    < > = values in this interval not displayed.     Hematology Recent Labs  Lab 11/09/20 0504 11/10/20 0521 11/11/20 0422  WBC 3.2* 3.2* 3.8*  RBC 3.72* 3.61* 3.43*  HGB 10.5* 10.1* 9.6*  HCT 32.3* 30.6* 29.9*  MCV 86.8 84.8 87.2  MCH 28.2 28.0 28.0  MCHC 32.5 33.0 32.1  RDW 15.3 15.0 15.5  PLT 209 197 202     BNP Recent Labs  Lab 11/07/20 0300  BNP 567.2*     DDimer No results for input(s): DDIMER in the last 168 hours.   Radiology    [place] ASCITES (ABDOMEN LIMITED)  Result Date: 11/10/2020 CLINICAL DATA:  Abdominal distension EXAM: LIMITED ABDOMEN ULTRASOUND FOR ASCITES TECHNIQUE: Limited ultrasound survey for ascites was performed in all four abdominal quadrants. COMPARISON:  None. FINDINGS: Four quadrant examination of the abdomen demonstrates no ascites. Liver has a lobular contour. RIGHT effusion noted. IMPRESSION: 1. No ascites. 2. Lobular liver. 3. RIGHT effusion. Electronically Signed   By: Suzy Bouchard M.D.   On: 11/10/2020 15:28    Cardiac Studies   Echo 07/2020: IMPRESSIONS   1. Left ventricular ejection fraction, by estimation, is 30 to 35%. The  left ventricle has moderately decreased function. The left ventricle  demonstrates global hypokinesis. The left ventricular internal cavity size  was mildly dilated. There is mild  left ventricular hypertrophy. Left ventricular diastolic parameters are  indeterminate.  2. Right ventricular systolic function is mildly reduced. The right  ventricular size is normal. There is moderately elevated pulmonary artery  systolic pressure. The estimated right ventricular systolic pressure is  31.5 mmHg.  3. Left atrial size was moderately dilated.  4. Right atrial size was moderately dilated.  5. The mitral valve is normal in structure. Moderate mitral valve  regurgitation, suspect functional. No evidence of mitral stenosis.  6. The aortic valve is tricuspid. Aortic valve regurgitation is mild. No  aortic stenosis is present.  7. The inferior vena cava is  dilated in size with <50% respiratory  variability, suggesting right atrial pressure of 15 mmHg.  8. The patient was in atrial fibrillation.   Patient Profile     59 y.o. male with chronic systolic and diastolic heart failure, nonischemic cardiomyopathy, atrial flutter,  cirrhosis, diabetes, and polysubstance abuse admitted with acute on chronic heart failure.  Assessment & Plan    #Acute on chronic systolic diastolic heart failure: #Moderate pulmonary hypertension: #Mild RV dysfunction: # Hyponatremia: # Hypertension:  LVEF 30 to 35% from echo 08/01/20.   Presented with volume overload and significant hyponatremia (116).  Given 1 dose of tolvaptan 11/19 for hyponatremia.  Diuresed well, hyponatremia improving.  Has had somewhat rapid increase in sodium (116 to 126 to 132 over last 2 days), would hold off on further tolvaptan and decrease IV lasix to 40 mg once daily today.  Continue to closely monitor sodium, will want to avoid correcting too quickly  # Atrial fibrillation/flutter:  Rates are controlled.  He is not on anticoagulation due to falls and alcohol abuse.  Not on beta blocker 2/2 bradycardia.  #Alcohol abuse: Continues to drink alcohol.  Needs rehab.  #PVCs: Previously on amiodarone, but this was discontinued this admission due to cirrhosis  # Syncope:  Per Dr. Aundra Dubin unclear whether arrhythmia or EtOH.  Not ICD candidate 2/2 substance abuse.       For questions or updates, please contact [place] Please consult [email or web address] for contact info under        Signed, Donato Heinz, MD  11/11/2020, 9:12 AM

## 2020-11-12 ENCOUNTER — Encounter (HOSPITAL_COMMUNITY): Payer: Medicaid Other

## 2020-11-12 DIAGNOSIS — N171 Acute kidney failure with acute cortical necrosis: Secondary | ICD-10-CM

## 2020-11-12 DIAGNOSIS — N1831 Chronic kidney disease, stage 3a: Secondary | ICD-10-CM

## 2020-11-12 LAB — BASIC METABOLIC PANEL
Anion gap: 9 (ref 5–15)
BUN: 17 mg/dL (ref 6–20)
CO2: 28 mmol/L (ref 22–32)
Calcium: 8.2 mg/dL — ABNORMAL LOW (ref 8.9–10.3)
Chloride: 94 mmol/L — ABNORMAL LOW (ref 98–111)
Creatinine, Ser: 1.24 mg/dL (ref 0.61–1.24)
GFR, Estimated: >60 mL/min (ref >60)
Glucose, Bld: 146 mg/dL — ABNORMAL HIGH (ref 70–99)
Potassium: 4 mmol/L (ref 3.5–5.1)
Sodium: 131 mmol/L — ABNORMAL LOW (ref 135–145)

## 2020-11-12 LAB — CBC
HCT: 31.9 % — ABNORMAL LOW (ref 39.0–52.0)
Hemoglobin: 10.2 g/dL — ABNORMAL LOW (ref 13.0–17.0)
MCH: 28.1 pg (ref 26.0–34.0)
MCHC: 32 g/dL (ref 30.0–36.0)
MCV: 87.9 fL (ref 80.0–100.0)
Platelets: 179 10*3/uL (ref 150–400)
RBC: 3.63 MIL/uL — ABNORMAL LOW (ref 4.22–5.81)
RDW: 15.7 % — ABNORMAL HIGH (ref 11.5–15.5)
WBC: 4.2 10*3/uL (ref 4.0–10.5)
nRBC: 0 % (ref 0.0–0.2)

## 2020-11-12 LAB — VITAMIN B1: Vitamin B1 (Thiamine): 117.4 nmol/L (ref 66.5–200.0)

## 2020-11-12 MED ORDER — FUROSEMIDE 40 MG PO TABS
40.0000 mg | ORAL_TABLET | Freq: Every evening | ORAL | Status: DC
  Administered 2020-11-12: 40 mg via ORAL
  Filled 2020-11-12: qty 1

## 2020-11-12 MED ORDER — FUROSEMIDE 40 MG PO TABS
80.0000 mg | ORAL_TABLET | Freq: Every day | ORAL | Status: DC
  Administered 2020-11-13: 80 mg via ORAL
  Filled 2020-11-12: qty 2

## 2020-11-12 NOTE — Progress Notes (Signed)
Physical Therapy Treatment Patient Details Name: Brad [name] MRN: [id number] DOB: 12-25-1960 Today's Date: 11/12/2020    History of Present Illness 59 y.o. male admitted for acute on chronic hyponatremia, anasarca. PMH significant for T2DM, HTN, HLD, permanent A. Fib, chronic combined systolic and diastolic CHF, CKD stage III, polysubstance abuse, hep C, alcoholic liver cirrhosis, depression, GERD.  Pt with multiple admission within the last 6 months.    PT Comments    Pt very agreeable to ambulate and reports LE edema much improved.  Pt supervision level for mobility and would like to ambulate with nursing staff.     Follow Up Recommendations  Home health PT     Equipment Recommendations  None recommended by PT    Recommendations for Other Services       Precautions / Restrictions Precautions Precautions: Fall    Mobility  Bed Mobility Overal bed mobility: Modified Independent                Transfers Overall transfer level: Needs assistance Equipment used: Rolling walker (2 wheeled) Transfers: Sit to/from Stand Sit to Stand: Supervision         General transfer comment: supervision for safety  Ambulation/Gait Ambulation/Gait assistance: Min guard;Supervision Gait Distance (Feet): 320 Feet Assistive device: Rolling walker (2 wheeled) Gait Pattern/deviations: Step-through pattern;Drifts right/left     General Gait Details: pt with occasional drift to left however much improved compared to last session, pt denies any symptoms, HR 92 bpm and SPO2 99% room air (after walking and using bathroom)   Stairs             Wheelchair Mobility    Modified [name] (Stroke Patients Only)       Balance                                            Cognition Arousal/Alertness: Awake/alert Behavior During Therapy: WFL for tasks assessed/performed Overall Cognitive Status: Within Functional Limits for tasks assessed                                         Exercises      General Comments        Pertinent Vitals/Pain Pain Assessment: Faces Faces Pain Scale: Hurts a little bit Pain Location: stomach Pain Descriptors / Indicators: Aching;Discomfort Pain Intervention(s): Monitored during session;Repositioned    Home Living                      Prior Function            PT Goals (current goals can now be found in the care plan section) Progress towards PT goals: Progressing toward goals    Frequency    Min 3X/week      PT Plan Current plan remains appropriate    Co-evaluation              AM-PAC PT "6 Clicks" Mobility   Outcome Measure  Help needed turning from your back to your side while in a flat bed without using bedrails?: None Help needed moving from lying on your back to sitting on the side of a flat bed without using bedrails?: None Help needed moving to and from a bed to a chair (including a wheelchair)?: A Little Help needed  standing up from a chair using your arms (e.g., wheelchair or bedside chair)?: A Little Help needed to walk in hospital room?: A Little Help needed climbing 3-5 steps with a railing? : A Little 6 Click Score: 20    End of Session Equipment Utilized During Treatment: Gait belt Activity Tolerance: Patient tolerated treatment well Patient left: in bed;with call bell/phone within reach;with bed alarm set Nurse Communication: Mobility status PT Visit Diagnosis: Other abnormalities of gait and mobility (R26.89)     Time: 2010-0712 PT Time Calculation (min) (ACUTE ONLY): 24 min  Charges:  $Gait Training: 8-22 mins                     Arlyce Dice, DPT Acute Rehabilitation Services Pager: [phone] Office: [place] 11/12/2020, 3:53 PM

## 2020-11-12 NOTE — Progress Notes (Signed)
SATURATION QUALIFICATIONS:   Patient Saturations on Room Air at Rest = 99%  Patient Saturations on Room Air while Ambulating = 99%

## 2020-11-12 NOTE — Progress Notes (Signed)
PROGRESS NOTE    [name]  SEG:[id number] DOB: 1961/03/17 DOA: 11/07/2020 PCP: Charlott Rakes, MD  Chief Complaint  Patient presents with  . Abdominal Pain  . Diarrhea   Brief Narrative:  Brad [name] is a 59 y.o. male with medical history significant of HFrEF, Neuropathy, a fib, EtOH abuse. Presenting with stomach pain for the last 2 days. He reports it's in the RLQ and LLQ. It's a throbbing pain that is intermittent in nature. It has been accompanied by diarrhea. He took an unspecified medicine to help, but it provided no relief. He also reports a couple of falls in the last 2 days. He denies head injury or LOC. He says he simply got weak and fell to the ground. He became concerned and came to the ED.    ED Course: CT ab pelvis obtained. Lab work showed hyponatremia. He was given lasix and TRH called for admission.   Patient continues to be treated for CHF exacerbation and hyponatremia.  Assessment & Plan:   Acute on chronic hypnatremia  Hypervolemic Hyponatremia Patient was started on intravenous furosemide.  Patient was also given tolvaptan on 11/19.  Patient had rapid correction in sodium level.  Dose of Lasix was decreased.  Tolvaptan was discontinued.  His hyponatremia is most likely due to hypervolemia.   Sodium level remained stable.  Patient remains on intravenous diuretics.    Acute on chronic systolic CHF/anasarca Cardiology is following.  Echocardiogram from August showed EF to be 30 to 35%.  Mitral regurgitation was noted.  Elevated pulmonary artery pressures were seen. Dose of diuretics was decreased yesterday.  Continues to have edema.  Further management per cardiology.  Patient is on BiDil.  Not on ACE inhibitor or ARB due to chronic kidney disease.  Not on beta-blocker due to history of significant bradycardia.    History of atrial fibrillation After discussions with cardiology amiodarone was discontinued due to presence of liver cirrhosis.   Rate is well  controlled.  Patient not noted to be on anticoagulation due to his alcoholism history of frequent falls.   Proctitis  Diarrhea  Abdominal Discomfort Patient continues to have pain in his lower abdomen.  He had a CT scan of his abdomen pelvis which raised concern for proctitis.  He also had diarrhea.  This appears to have subsided.  Stool studies were ordered but not sent since his diarrhea subsided.  He also noticed some discomfort with urination however UA was unremarkable.  He apparently had some urinary retention as well.  No retention noted on bladder scan.  Repeat UA did not show any infection.  Patient reassured. CT scan did not show any abnormalities in the GU tract.  Urinary Retention Patient has been able to void without any difficulty.   Liver cirrhosis/hepatitis C CT findings showed changes consistent with portal colopathy and portal gastritis.  Patient was counseled on his alcohol intake.  Hepatitis panel that showed a reactive hepatitis C antibody.  HCV quantitative is 727,000.  Will need outpatient follow-up with infectious disease.    EtOH abuse/Hx of cocaine abuse/Tobacco abuse Patient counseled.  B12 level 305.  Noted to be 160 back in July. Thiamine level pending.  Urine drug screen positive for cocaine.  Counseled.  Nicotine patch.  Neuropathy Continue cymbalta, neurontin  Normocytic anemia/Leukopenia Hemoglobin is stable.  Likely due to chronic disease.  No evidence for overt blood loss.  Chronic kidney disease stage IIIa Renal function seems to be close to baseline.   DVT prophylaxis: lovenox  Code Status: full  Family Communication: none at bedside Disposition: Hopefully return home when cleared by cardiology and when he has been adequately diuresed.  Mobilize.  Status is: Inpatient  Remains inpatient appropriate because:Inpatient level of care appropriate due to severity of illness   Dispo: The patient is from: Home              Anticipated d/c is to:  Home              Anticipated d/c date is: > 3 days              Patient currently is not medically stable to d/c.   Consultants:   Cardiology  Nephrology  Procedures:   none  Antimicrobials:  Anti-infectives (From admission, onward)   None         Subjective: Patient continues to have abdominal discomfort without any nausea vomiting.  Stable compared to yesterday.  Diarrhea has resolved.  No shortness of breath or chest pain.  Continues to have swelling in his legs.    Objective: Vitals:   11/11/20 1254 11/11/20 2000 11/12/20 0340 11/12/20 0344  BP: 103/66 137/89 124/82   Pulse: 61 76 63   Resp: 20     Temp: 97.6 F (36.4 C) 97.7 F (36.5 C) 97.6 F (36.4 C)   TempSrc: Oral Oral Oral   SpO2: 98% 100% 96%   Weight:    82.2 kg  Height:        Intake/Output Summary (Last 24 hours) at 11/12/2020 1139 Last data filed at 11/12/2020 0900 Gross per 24 hour  Intake 1138 ml  Output 2030 ml  Net -892 ml   Filed Weights   11/10/20 0728 11/11/20 0606 11/12/20 0344  Weight: 81.4 kg 80.6 kg 82.2 kg    Examination:  General appearance: Awake alert.  In no distress Resp: Few crackles at the bases.  Clear to auscultation bilaterally otherwise. Cardio: S1-S2 is irregularly irregular.  No S3-S4.  No rubs murmurs or bruit GI: Abdomen is soft.  Mildly tender diffusely without any rebound rigidity or guarding.  No masses organomegaly.  Bowel sounds present.  Extremities: Edema noted bilateral lower extremities.  Improved from yesterday. Neurologic: Alert and oriented x3.  No focal neurological deficits.     Data Reviewed: I have personally reviewed following labs and imaging studies  CBC: Recent Labs  Lab 11/07/20 0301 11/07/20 1048 11/08/20 0318 11/09/20 0504 11/10/20 0521 11/11/20 0422 11/12/20 0427  WBC 3.7*   < > 5.6 3.2* 3.2* 3.8* 4.2  NEUTROABS 2.9  --   --  2.2 2.3 2.7  --   HGB 12.3*   < > 11.2* 10.5* 10.1* 9.6* 10.2*  HCT 36.3*   < > 33.6* 32.3* 30.6*  29.9* 31.9*  MCV 83.4   < > 84.6 86.8 84.8 87.2 87.9  PLT 339   < > 235 209 197 202 179   < > = values in this interval not displayed.    Basic Metabolic Panel: Recent Labs  Lab 11/09/20 0504 11/09/20 0955 11/10/20 0521 11/10/20 0521 11/10/20 1531 11/10/20 2146 11/11/20 0422 11/11/20 0909 11/12/20 0427  NA 116*   < > 126*  126*  126*   < > 128* 130* 132*  132* 129* 131*  K 4.1   < > 3.6  3.7  3.8   < > 3.8 3.9 3.9  3.9 3.8 4.0  CL 87*   < > 91*  90*  91*   < > 95*  93* 95*  95* 95* 94*  CO2 22   < > 26  27  27    < > 26 27 29  29 29 28   GLUCOSE 135*   < > 148*  148*  149*   < > 179* 151* 147*  148* 134* 146*  BUN 19   < > 18  18  17    < > 16 16 17  17 18 17   CREATININE 1.50*   < > 1.39*  1.36*  1.32*   < > 1.36* 1.41* 1.43*  1.31* 1.22 1.24  CALCIUM 7.9*   < > 7.9*  7.8*  7.9*   < > 7.7* 8.1* 8.0*  8.0* 8.1* 8.2*  MG 1.7  --  1.6*  --   --   --  1.6*  --   --   PHOS 3.2   < > 3.5  3.4  3.4  --  3.2 3.4 3.3  3.2 3.3  --    < > = values in this interval not displayed.    GFR: Estimated Creatinine Clearance: 67.5 mL/min (by C-G formula based on SCr of 1.24 mg/dL).  Liver Function Tests: Recent Labs  Lab 11/07/20 0301 11/07/20 1049 11/08/20 1149 11/08/20 1716 11/09/20 0504 11/09/20 0955 11/10/20 0521 11/10/20 1531 11/10/20 2146 11/11/20 0422 11/11/20 0909  AST 32  --  30  --  28  --  26  --   --  25  --   ALT 25  --  19  --  19  --  18  --   --  18  --   ALKPHOS 67  --  52  --  55  --  56  --   --  57  --   BILITOT 0.7  --  0.6  --  0.5  --  0.4  --   --  0.3  --   PROT 5.9*  --  5.3*  --  5.1*  --  4.9*  --   --  5.3*  --   ALBUMIN 2.6*   < > 2.2*   < > 2.4*   < > 2.4*  2.4*  2.4* 2.4* 2.6* 2.4*  2.4* 2.6*   < > = values in this interval not displayed.    Recent Results (from the past 240 hour(s))  Respiratory Panel by RT PCR (Flu A&B, Covid) - Nasopharyngeal Swab     Status: None   Collection Time: 11/07/20  4:10 AM   Specimen:  Nasopharyngeal Swab  Result Value Ref Range Status   SARS Coronavirus 2 by RT PCR NEGATIVE NEGATIVE Final    Comment: (NOTE) SARS-CoV-2 target nucleic acids are NOT DETECTED.  The SARS-CoV-2 RNA is generally detectable in upper respiratoy specimens during the acute phase of infection. The lowest concentration of SARS-CoV-2 viral copies this assay can detect is 131 copies/mL. A negative result does not preclude SARS-Cov-2 infection and should not be used as the sole basis for treatment or other patient management decisions. A negative result may occur with  improper specimen collection/handling, submission of specimen other than nasopharyngeal swab, presence of viral mutation(s) within the areas targeted by this assay, and inadequate number of viral copies (<131 copies/mL). A negative result must be combined with clinical observations, patient history, and epidemiological information. The expected result is Negative.  Fact Sheet for Patients:  PinkCheek.be  Fact Sheet for Healthcare Providers:  GravelBags.it  This test is no t yet approved or cleared by the [place]  States FDA and  has been authorized for detection and/or diagnosis of SARS-CoV-2 by FDA under an Emergency Use Authorization (EUA). This EUA will remain  in effect (meaning this test can be used) for the duration of the COVID-19 declaration under Section 564(b)(1) of the Act, 21 U.S.C. section 360bbb-3(b)(1), unless the authorization is terminated or revoked sooner.     Influenza A by PCR NEGATIVE NEGATIVE Final   Influenza B by PCR NEGATIVE NEGATIVE Final    Comment: (NOTE) The Xpert Xpress SARS-CoV-2/FLU/RSV assay is intended as an aid in  the diagnosis of influenza from Nasopharyngeal swab specimens and  should not be used as a sole basis for treatment. Nasal washings and  aspirates are unacceptable for Xpert Xpress SARS-CoV-2/FLU/RSV  testing.  Fact Sheet  for Patients: PinkCheek.be  Fact Sheet for Healthcare Providers: GravelBags.it  This test is not yet approved or cleared by the [place] FDA and  has been authorized for detection and/or diagnosis of SARS-CoV-2 by  FDA under an Emergency Use Authorization (EUA). This EUA will remain  in effect (meaning this test can be used) for the duration of the  Covid-19 declaration under Section 564(b)(1) of the Act, 21  U.S.C. section 360bbb-3(b)(1), unless the authorization is  terminated or revoked. Performed at [facility], [place] [address], [place], [place] [address]          Radiology Studies: [place] ASCITES (ABDOMEN LIMITED)  Result Date: 11/10/2020 CLINICAL DATA:  Abdominal distension EXAM: LIMITED ABDOMEN ULTRASOUND FOR ASCITES TECHNIQUE: Limited ultrasound survey for ascites was performed in all four abdominal quadrants. COMPARISON:  None. FINDINGS: Four quadrant examination of the abdomen demonstrates no ascites. Liver has a lobular contour. RIGHT effusion noted. IMPRESSION: 1. No ascites. 2. Lobular liver. 3. RIGHT effusion. Electronically Signed   By: Suzy Bouchard M.D.   On: 11/10/2020 15:28        Scheduled Meds: . atorvastatin  20 mg Oral q1800  . DULoxetine  60 mg Oral Daily  . folic acid  1 mg Oral Daily  . furosemide  40 mg Intravenous Daily  . gabapentin  600 mg Oral Daily  . gabapentin  900 mg Oral QHS  . isosorbide-hydrALAZINE  1 tablet Oral BID  . multivitamin with minerals  1 tablet Oral Daily  . nicotine  14 mg Transdermal Daily  . pantoprazole  40 mg Oral Daily  . polyethylene glycol  17 g Oral BID  . thiamine  100 mg Oral Daily   Or  . thiamine  100 mg Intravenous Daily   Continuous Infusions:   LOS: 5 days     Bonnielee Haff, MD [facility]   To contact the attending provider between 7A-7P or the covering provider during after hours 7P-7A, please log into  the web site [email or web address] and access using universal [employer] password for that web site. If you do not have the password, please call the hospital operator.  11/12/2020, 11:39 AM

## 2020-11-12 NOTE — Progress Notes (Addendum)
Progress Note  Patient Name: Brad [name] Date of Encounter: 11/12/2020  [facility] Cardiologist: [name], MD   Subjective   He remains in rate controlled Afib. He put out 1.1L overnight however weight is up from yesterday. Creatinine stable. Sodium better at 131. Pressures stable. Still has some lower leg edema. No chest pain or sob.   Inpatient Medications    Scheduled Meds: . atorvastatin  20 mg Oral q1800  . DULoxetine  60 mg Oral Daily  . folic acid  1 mg Oral Daily  . furosemide  40 mg Intravenous Daily  . gabapentin  600 mg Oral Daily  . gabapentin  900 mg Oral QHS  . isosorbide-hydrALAZINE  1 tablet Oral BID  . multivitamin with minerals  1 tablet Oral Daily  . nicotine  14 mg Transdermal Daily  . pantoprazole  40 mg Oral Daily  . polyethylene glycol  17 g Oral BID  . thiamine  100 mg Oral Daily   Or  . thiamine  100 mg Intravenous Daily   Continuous Infusions:  PRN Meds: acetaminophen **OR** acetaminophen, ondansetron **OR** ondansetron (ZOFRAN) IV, oxyCODONE **OR** oxyCODONE   Vital Signs    Vitals:   11/11/20 1254 11/11/20 2000 11/12/20 0340 11/12/20 0344  BP: 103/66 137/89 124/82   Pulse: 61 76 63   Resp: 20     Temp: 97.6 F (36.4 C) 97.7 F (36.5 C) 97.6 F (36.4 C)   TempSrc: Oral Oral Oral   SpO2: 98% 100% 96%   Weight:    82.2 kg  Height:        Intake/Output Summary (Last 24 hours) at 11/12/2020 0807 Last data filed at 11/12/2020 0640 Gross per 24 hour  Intake 1258 ml  Output 2430 ml  Net -1172 ml   Last 3 Weights 11/12/2020 11/11/2020 11/10/2020  Weight (lbs) 181 lb 4.8 oz 177 lb 9.6 oz 179 lb 6.4 oz  Weight (kg) 82.237 kg 80.559 kg 81.375 kg  Some encounter information is confidential and restricted. Go to Review Flowsheets activity to see all data.      Telemetry    Afib, HR 60-70, PVCs - Personally Reviewed  ECG    No new- Personally Reviewed  Physical Exam   GEN: No acute distress.   Neck: mod  JVD Cardiac: Irreg Irreg, + murmur, no rubs, or gallops.  Respiratory: Clear to auscultation bilaterally. GI: firm, nontender MS: 2+lower leg/pedal edema; No deformity. Neuro:  Nonfocal  Psych: Normal affect   Labs    High Sensitivity Troponin:   Recent Labs  Lab 10/17/20 2024 10/17/20 2223 10/24/20 1850 10/24/20 2119  TROPONINIHS 36* 31* 27* 29*      Chemistry Recent Labs  Lab 11/09/20 0504 11/09/20 0955 11/10/20 0521 11/10/20 1531 11/10/20 2146 11/10/20 2146 11/11/20 0422 11/11/20 0909 11/12/20 0427  NA 116*   < > 126*  126*  126*   < > 130*   < > 132*  132* 129* 131*  K 4.1   < > 3.6  3.7  3.8   < > 3.9   < > 3.9  3.9 3.8 4.0  CL 87*   < > 91*  90*  91*   < > 93*   < > 95*  95* 95* 94*  CO2 22   < > 26  27  27    < > 27   < > 29  29 29 28   GLUCOSE 135*   < > 148*  148*  149*   < >  151*   < > 147*  148* 134* 146*  BUN 19   < > 18  18  17    < > 16   < > 17  17 18 17   CREATININE 1.50*   < > 1.39*  1.36*  1.32*   < > 1.41*   < > 1.43*  1.31* 1.22 1.24  CALCIUM 7.9*   < > 7.9*  7.8*  7.9*   < > 8.1*   < > 8.0*  8.0* 8.1* 8.2*  PROT 5.1*  --  4.9*  --   --   --  5.3*  --   --   ALBUMIN 2.4*   < > 2.4*  2.4*  2.4*   < > 2.6*  --  2.4*  2.4* 2.6*  --   AST 28  --  26  --   --   --  25  --   --   ALT 19  --  18  --   --   --  18  --   --   ALKPHOS 55  --  56  --   --   --  57  --   --   BILITOT 0.5  --  0.4  --   --   --  0.3  --   --   GFRNONAA 53*   < > 58*  60*  >60   < > 57*   < > 56*  >60 >60 >60  ANIONGAP 7   < > 9  9  8    < > 10   < > 8  8 5 9    < > = values in this interval not displayed.    Hematology Recent Labs  Lab 11/10/20 0521 11/11/20 0422 11/12/20 0427  WBC 3.2* 3.8* 4.2  RBC 3.61* 3.43* 3.63*  HGB 10.1* 9.6* 10.2*  HCT 30.6* 29.9* 31.9*  MCV 84.8 87.2 87.9  MCH 28.0 28.0 28.1  MCHC 33.0 32.1 32.0  RDW 15.0 15.5 15.7*  PLT 197 202 179   BNP Recent Labs  Lab 11/07/20 0300  BNP 567.2*     DDimer No  results for input(s): DDIMER in the last 168 hours.   Radiology    [place] ASCITES (ABDOMEN LIMITED)  Result Date: 11/10/2020 CLINICAL DATA:  Abdominal distension EXAM: LIMITED ABDOMEN ULTRASOUND FOR ASCITES TECHNIQUE: Limited ultrasound survey for ascites was performed in all four abdominal quadrants. COMPARISON:  None. FINDINGS: Four quadrant examination of the abdomen demonstrates no ascites. Liver has a lobular contour. RIGHT effusion noted. IMPRESSION: 1. No ascites. 2. Lobular liver. 3. RIGHT effusion. Electronically Signed   By: Suzy Bouchard M.D.   On: 11/10/2020 15:28    Cardiac Studies   Echo 07/2020: IMPRESSIONS   1. Left ventricular ejection fraction, by estimation, is 30 to 35%. The  left ventricle has moderately decreased function. The left ventricle  demonstrates global hypokinesis. The left ventricular internal cavity size  was mildly dilated. There is mild  left ventricular hypertrophy. Left ventricular diastolic parameters are  indeterminate.  2. Right ventricular systolic function is mildly reduced. The right  ventricular size is normal. There is moderately elevated pulmonary artery  systolic pressure. The estimated right ventricular systolic pressure is  16.1 mmHg.  3. Left atrial size was moderately dilated.  4. Right atrial size was moderately dilated.  5. The mitral valve is normal in structure. Moderate mitral valve  regurgitation, suspect functional. No evidence of mitral stenosis.  6. The  aortic valve is tricuspid. Aortic valve regurgitation is mild. No  aortic stenosis is present.  7. The inferior vena cava is dilated in size with <50% respiratory  variability, suggesting right atrial pressure of 15 mmHg.  8. The patient was in atrial fibrillation.   Patient Profile     59 y.o. male  with chronic systolic and diastolic heart failure, nonischemic cardiomyopathy, atrial flutter, cirrhosis, diabetes, and polysubstance abuse admitted with acute on  chronic heart failure.  Assessment & Plan    Acute on chronic systolic and diastolic heart failure/Mod Pulmonary HTN/mild RV dysfunction - presented with volume overload and hyponatremia (116) s/p tolvaltan x 1 and started on diuresis - LVEF 30-35% on echo 08/01/20 - Sodium improved and IV lasix decreased to 40 mg daily - Net -8.7L - weight up overnight however still lower than admission - continue Bidil - No BB with bradycardia and cocaine use - Still volume up, would continue with diuresis  Hyponatremia - likely secondary to hypervolemia - treated with tolvaptan>>now dic'd  Afib/flutter - rate controlled at baseline - No BB 2/2 bradycardia - no a/c due to falls and alcohol abuse - was on amio, however this was d/c'd due to liver cirrhosis  Alcohol abuse/Liver cirrhosis Tobacco use - continues to drink alcohol - UDS positive for cocaine  PVCs - previously on amiodarone however this was discontinued due to cirrhosis  CKD stage 3 - creatinine stable, around 1.2  Proctitis/abd discomfort - per IM  For questions or updates, please contact [facility] Please consult [email or web address] for contact info under     Signed, Cadence Ninfa Meeker, PA-C  11/12/2020, 8:07 AM     The patient was seen, examined and discussed with Cadence Ninfa Meeker, PA-C   and I agree with the above.   The patient lays comfortably in bed, denies any chest pain or shortness of breath, he has mild residual lower extremity edema, his weight currently 81 kg what is below his baseline of 82 kg, he could be switched to oral Lasix of 80 in the morning and 40 in the afternoon and be discharged home today.  We will arrange for follow-up in 2 to 3 weeks.  His creatinine has improved today from 1.4-1.24.  Hyponatremia stable at 131.  Ena Dawley, MD 11/12/2020

## 2020-11-12 NOTE — Plan of Care (Signed)
  Problem: Education: Goal: Knowledge of General Education information will improve Description Including pain rating scale, medication(s)/side effects and non-pharmacologic comfort measures Outcome: Progressing   Problem: Health Behavior/Discharge Planning: Goal: Ability to manage health-related needs will improve Outcome: Progressing   Problem: Clinical Measurements: Goal: Ability to maintain clinical measurements within normal limits will improve Outcome: Progressing Goal: Will remain free from infection Outcome: Progressing Goal: Diagnostic test results will improve Outcome: Progressing Goal: Respiratory complications will improve Outcome: Progressing Goal: Cardiovascular complication will be avoided Outcome: Progressing   Problem: Activity: Goal: Risk for activity intolerance will decrease Outcome: Progressing   Problem: Nutrition: Goal: Adequate nutrition will be maintained Outcome: Progressing   Problem: Coping: Goal: Level of anxiety will decrease Outcome: Progressing   Problem: Elimination: Goal: Will not experience complications related to bowel motility Outcome: Progressing Goal: Will not experience complications related to urinary retention Outcome: Progressing   Problem: Pain Managment: Goal: General experience of comfort will improve Outcome: Progressing   Problem: Safety: Goal: Ability to remain free from injury will improve Outcome: Progressing   Problem: Skin Integrity: Goal: Risk for impaired skin integrity will decrease Outcome: Progressing   Problem: Education: Goal: Knowledge of disease or condition will improve Outcome: Progressing Goal: Understanding of discharge needs will improve Outcome: Progressing   Problem: Health Behavior/Discharge Planning: Goal: Ability to identify changes in lifestyle to reduce recurrence of condition will improve Outcome: Progressing Goal: Identification of resources available to assist in meeting health  care needs will improve Outcome: Progressing   Problem: Physical Regulation: Goal: Complications related to the disease process, condition or treatment will be avoided or minimized Outcome: Progressing   Problem: Safety: Goal: Ability to remain free from injury will improve Outcome: Progressing   

## 2020-11-13 LAB — BASIC METABOLIC PANEL
Anion gap: 8 (ref 5–15)
BUN: 18 mg/dL (ref 6–20)
CO2: 29 mmol/L (ref 22–32)
Calcium: 8.1 mg/dL — ABNORMAL LOW (ref 8.9–10.3)
Chloride: 94 mmol/L — ABNORMAL LOW (ref 98–111)
Creatinine, Ser: 1.27 mg/dL — ABNORMAL HIGH (ref 0.61–1.24)
GFR, Estimated: >60 mL/min (ref >60)
Glucose, Bld: 131 mg/dL — ABNORMAL HIGH (ref 70–99)
Potassium: 3.9 mmol/L (ref 3.5–5.1)
Sodium: 131 mmol/L — ABNORMAL LOW (ref 135–145)

## 2020-11-13 MED ORDER — FUROSEMIDE 40 MG PO TABS
ORAL_TABLET | ORAL | 1 refills | Status: DC
Start: 2020-11-13 — End: 2021-01-16

## 2020-11-13 MED ORDER — OXYCODONE HCL 10 MG PO TABS: 10.0000 mg | ORAL_TABLET | ORAL | 0 refills | Status: DC | PRN

## 2020-11-13 MED ORDER — POLYETHYLENE GLYCOL 3350 17 G PO PACK: 17.0000 g | PACK | Freq: Two times a day (BID) | ORAL | 0 refills | Status: AC

## 2020-11-13 NOTE — Progress Notes (Signed)
RN reviewed discharge paperwork and reviewed medications with patient. All questions addressed. IV removed. Tele removed. Pt getting himself dressed to be picked up by his roommate. No further needs at this time.

## 2020-11-13 NOTE — Plan of Care (Signed)
  Problem: Education: Goal: Knowledge of General Education information will improve Description: Including pain rating scale, medication(s)/side effects and non-pharmacologic comfort measures Outcome: Adequate for Discharge   Problem: Health Behavior/Discharge Planning: Goal: Ability to manage health-related needs will improve Outcome: Adequate for Discharge   Problem: Clinical Measurements: Goal: Ability to maintain clinical measurements within normal limits will improve Outcome: Adequate for Discharge Goal: Will remain free from infection Outcome: Adequate for Discharge Goal: Diagnostic test results will improve Outcome: Adequate for Discharge Goal: Respiratory complications will improve Outcome: Adequate for Discharge Goal: Cardiovascular complication will be avoided Outcome: Adequate for Discharge   Problem: Activity: Goal: Risk for activity intolerance will decrease Outcome: Adequate for Discharge   Problem: Nutrition: Goal: Adequate nutrition will be maintained Outcome: Adequate for Discharge   Problem: Coping: Goal: Level of anxiety will decrease Outcome: Adequate for Discharge   Problem: Elimination: Goal: Will not experience complications related to bowel motility Outcome: Adequate for Discharge Goal: Will not experience complications related to urinary retention Outcome: Adequate for Discharge   Problem: Pain Managment: Goal: General experience of comfort will improve Outcome: Adequate for Discharge   Problem: Safety: Goal: Ability to remain free from injury will improve Outcome: Adequate for Discharge   Problem: Skin Integrity: Goal: Risk for impaired skin integrity will decrease Outcome: Adequate for Discharge   Problem: Education: Goal: Knowledge of disease or condition will improve Outcome: Adequate for Discharge Goal: Understanding of discharge needs will improve Outcome: Adequate for Discharge   Problem: Health Behavior/Discharge  Planning: Goal: Ability to identify changes in lifestyle to reduce recurrence of condition will improve Outcome: Adequate for Discharge Goal: Identification of resources available to assist in meeting health care needs will improve Outcome: Adequate for Discharge   Problem: Physical Regulation: Goal: Complications related to the disease process, condition or treatment will be avoided or minimized Outcome: Adequate for Discharge   Problem: Safety: Goal: Ability to remain free from injury will improve Outcome: Adequate for Discharge   

## 2020-11-13 NOTE — Discharge Summary (Signed)
[facility]  Physician Discharge Summary   Patient ID: Brad [name] MRN: [id number] DOB/AGE: 1961/11/08 59 y.o.  Admit date: 11/07/2020 Discharge date: 11/13/2020  PCP: Charlott Rakes, MD  DISCHARGE DIAGNOSES:  Acute on chronic hyponatremia Acute on chronic systolic CHF/anasarca Atrial fibrillation Proctitis History of hepatitis C Liver cirrhosis Polysubstance abuse including alcohol cocaine and tobacco History of peripheral neuropathy Normocytic anemia Chronic kidney disease stage IIIa  RECOMMENDATIONS FOR OUTPATIENT FOLLOW UP: 1. Consider referral to infectious disease for hepatitis C if not done previously 2. Cardiology to arrange outpatient follow-up for CHF    Home Health: PT and RN Equipment/Devices: None  CODE STATUS: Full code  DISCHARGE CONDITION: fair  Diet recommendation: Heart healthy  INITIAL HISTORY: Brad [name] a 59 y.o.malewith medical history significant ofHFrEF, Neuropathy, a fib, EtOH abuse. Presenting with stomach pain for the last 2 days. He reports it's in the RLQ and LLQ. It's a throbbing pain that is intermittent in nature. It has been accompanied by diarrhea. He took an unspecified medicine to help, but it provided no relief. He also reports a couple of falls in the last 2 days. He denies head injury or LOC. He says he simply got weak and fell to the ground. He became concerned and came to the ED.  ED Course:CT ab pelvis obtained. Lab work showed hyponatremia. He was given lasix and [facility] called for admission.  Consultations:  Cardiology and nephrology  Procedures:  None   HOSPITAL COURSE:   Acute on chronic hypnatremia Hypervolemic Hyponatremia Patient was started on intravenous furosemide.  Patient was also given tolvaptan on 11/19.  Patient had rapid correction in sodium level.  Dose of Lasix was decreased.  Tolvaptan was discontinued.  His hyponatremia is most likely due to hypervolemia.   Improvement in  sodium levels noted.  Stable for the last 3 days.  Acute on chronic systolic CHF/anasarca Echocardiogram from August showed EF to be 30 to 35%.  Mitral regurgitation was noted.  Elevated pulmonary artery pressures were seen.  Cardiology was consulted.  Patient initially given intravenous diuretics.  He is known to be noncompliant with treatment.  Also has a history of polysubstance abuse.  He was diuresed.  Continue BiDil.  Dose of Lasix increased.  No ACE inhibitor or ARB due to chronic kidney disease.  Not on beta-blocker due to history of significant bradycardia.   History of atrial fibrillation After discussions with cardiology amiodarone was discontinued due to presence of liver cirrhosis.  Rate is well controlled.  Patient not noted to be on anticoagulation due to his alcoholism history of frequent falls.  Proctitis  Diarrhea  Abdominal Discomfort Patient continues to have pain in his lower abdomen.  He had a CT scan of his abdomen pelvis which raised concern for proctitis.  He also had diarrhea.  This appears to have subsided.  Stool studies were ordered but not sent since his diarrhea subsided.  He also noticed some discomfort with urination however UA was unremarkable.  He apparently had some urinary retention as well.  No retention noted on bladder scan.  Repeat UA did not show any infection.  Patient reassured. CT scan did not show any abnormalities in the GU tract.  Patient prescribed few days worth of oxycodone.  Further management per PCP.  Urinary Retention Patient has been able to void without any difficulty.   Liver cirrhosis/hepatitis C CT findings showed changes consistent with portal colopathy and portal gastritis.  Patient was counseled on his alcohol intake.  Hepatitis panel  that showed a reactive hepatitis C antibody.  HCV quantitative is 727,000.  Has previous history of same.  Unclear if he has ever followed up with infectious disease.  Will defer this to primary care  provider.  EtOH abuse/Hx of cocaine abuse/Tobacco abuse Patient counseled.  B12 level 305.  Noted to be 160 back in July. Thiamine level pending.  Urine drug screen positive for cocaine.  Counseled.  Nicotine patch.  Neuropathy Continue cymbalta, neurontin  Normocytic anemia/Leukopenia Hemoglobin is stable.  Likely due to chronic disease.  No evidence for overt blood loss.  Chronic kidney disease stage IIIa Renal function seems to be close to baseline.  Cervical spine stenosis Moderate to severe spinal stenosis noted on CT of the cervical spine.  Patient does not have any neurological deficits.  Outpatient follow-up.  Overall stable.  Okay for discharge home today.   PERTINENT LABS:  The results of significant diagnostics from this hospitalization (including imaging, microbiology, ancillary and laboratory) are listed below for reference.    Microbiology: Recent Results (from the past 240 hour(s))  Respiratory Panel by RT PCR (Flu A&B, Covid) - Nasopharyngeal Swab     Status: None   Collection Time: 11/07/20  4:10 AM   Specimen: Nasopharyngeal Swab  Result Value Ref Range Status   SARS Coronavirus 2 by RT PCR NEGATIVE NEGATIVE Final    Comment: (NOTE) SARS-CoV-2 target nucleic acids are NOT DETECTED.  The SARS-CoV-2 RNA is generally detectable in upper respiratoy specimens during the acute phase of infection. The lowest concentration of SARS-CoV-2 viral copies this assay can detect is 131 copies/mL. A negative result does not preclude SARS-Cov-2 infection and should not be used as the sole basis for treatment or other patient management decisions. A negative result may occur with  improper specimen collection/handling, submission of specimen other than nasopharyngeal swab, presence of viral mutation(s) within the areas targeted by this assay, and inadequate number of viral copies (<131 copies/mL). A negative result must be combined with clinical observations, patient  history, and epidemiological information. The expected result is Negative.  Fact Sheet for Patients:  PinkCheek.be  Fact Sheet for Healthcare Providers:  GravelBags.it  This test is no t yet approved or cleared by the [place] FDA and  has been authorized for detection and/or diagnosis of SARS-CoV-2 by FDA under an Emergency Use Authorization (EUA). This EUA will remain  in effect (meaning this test can be used) for the duration of the COVID-19 declaration under Section 564(b)(1) of the Act, 21 U.S.C. section 360bbb-3(b)(1), unless the authorization is terminated or revoked sooner.     Influenza A by PCR NEGATIVE NEGATIVE Final   Influenza B by PCR NEGATIVE NEGATIVE Final    Comment: (NOTE) The Xpert Xpress SARS-CoV-2/FLU/RSV assay is intended as an aid in  the diagnosis of influenza from Nasopharyngeal swab specimens and  should not be used as a sole basis for treatment. Nasal washings and  aspirates are unacceptable for Xpert Xpress SARS-CoV-2/FLU/RSV  testing.  Fact Sheet for Patients: PinkCheek.be  Fact Sheet for Healthcare Providers: GravelBags.it  This test is not yet approved or cleared by the [place] FDA and  has been authorized for detection and/or diagnosis of SARS-CoV-2 by  FDA under an Emergency Use Authorization (EUA). This EUA will remain  in effect (meaning this test can be used) for the duration of the  Covid-19 declaration under Section 564(b)(1) of the Act, 21  U.S.C. section 360bbb-3(b)(1), unless the authorization is  terminated or revoked. Performed at  [facility], [place] [address], [place], [place] [address]      Labs:     Basic Metabolic Panel: Recent Labs  Lab 11/09/20 0504 11/09/20 0955 11/10/20 0521 11/10/20 0521 11/10/20 1531 11/10/20 1531 11/10/20 2146 11/11/20 0422 11/11/20 0909  11/12/20 0427 11/13/20 0429  NA 116*   < > 126*  126*  126*   < > 128*   < > 130* 132*  132* 129* 131* 131*  K 4.1   < > 3.6  3.7  3.8   < > 3.8   < > 3.9 3.9  3.9 3.8 4.0 3.9  CL 87*   < > 91*  90*  91*   < > 95*   < > 93* 95*  95* 95* 94* 94*  CO2 22   < > '26  27  27   ' < > 26   < > '27 29  29 29 28 29  ' GLUCOSE 135*   < > 148*  148*  149*   < > 179*   < > 151* 147*  148* 134* 146* 131*  BUN 19   < > '18  18  17   ' < > 16   < > '16 17  17 18 17 18  ' CREATININE 1.50*   < > 1.39*  1.36*  1.32*   < > 1.36*   < > 1.41* 1.43*  1.31* 1.22 1.24 1.27*  CALCIUM 7.9*   < > 7.9*  7.8*  7.9*   < > 7.7*   < > 8.1* 8.0*  8.0* 8.1* 8.2* 8.1*  MG 1.7  --  1.6*  --   --   --   --  1.6*  --   --   --   PHOS 3.2   < > 3.5  3.4  3.4  --  3.2  --  3.4 3.3  3.2 3.3  --   --    < > = values in this interval not displayed.   Liver Function Tests: Recent Labs  Lab 11/07/20 0301 11/07/20 1049 11/08/20 1149 11/08/20 1716 11/09/20 0504 11/09/20 0955 11/10/20 0521 11/10/20 1531 11/10/20 2146 11/11/20 0422 11/11/20 0909  AST 32  --  30  --  28  --  26  --   --  25  --   ALT 25  --  19  --  19  --  18  --   --  18  --   ALKPHOS 67  --  52  --  55  --  56  --   --  57  --   BILITOT 0.7  --  0.6  --  0.5  --  0.4  --   --  0.3  --   PROT 5.9*  --  5.3*  --  5.1*  --  4.9*  --   --  5.3*  --   ALBUMIN 2.6*   < > 2.2*   < > 2.4*   < > 2.4*  2.4*  2.4* 2.4* 2.6* 2.4*  2.4* 2.6*   < > = values in this interval not displayed.   Recent Labs  Lab 11/07/20 0301 11/09/20 0955  LIPASE 35 26   CBC: Recent Labs  Lab 11/07/20 0301 11/07/20 1048 11/08/20 0318 11/09/20 0504 11/10/20 0521 11/11/20 0422 11/12/20 0427  WBC 3.7*   < > 5.6 3.2* 3.2* 3.8* 4.2  NEUTROABS 2.9  --   --  2.2 2.3 2.7  --  HGB 12.3*   < > 11.2* 10.5* 10.1* 9.6* 10.2*  HCT 36.3*   < > 33.6* 32.3* 30.6* 29.9* 31.9*  MCV 83.4   < > 84.6 86.8 84.8 87.2 87.9  PLT 339   < > 235 209 197 202 179   < > = values in  this interval not displayed.   BNP: BNP (last 3 results) Recent Labs    10/22/20 0540 10/24/20 2113 11/07/20 0300  BNP 975.6* 834.0* 567.2*      IMAGING STUDIES   CT Cervical Spine Wo Contrast  Result Date: 11/07/2020 CLINICAL DATA:  Status post cervical fusion, multiple falls with neck injury EXAM: CT CERVICAL SPINE WITHOUT CONTRAST TECHNIQUE: Multidetector CT imaging of the cervical spine was performed without intravenous contrast. Multiplanar CT image reconstructions were also generated. COMPARISON:  None. FINDINGS: Alignment: Anterior cervical discectomy and fusion procedure with instrumentation at C4-C6 has been performed. No bridging callus yet identified. Normal alignment across the fused segment. Normal overall alignment. No listhesis. Skull base and vertebrae: The craniocervical junction is unremarkable. The atlantodental interval is normal. No acute fracture of the cervical spine. Soft tissues and spinal canal: There is congenital narrowing of the spinal canal due to shortening of the pedicles which, in combination with posterior disc osteophyte complex at C4-5 and C5-6 results in moderate to severe central canal stenosis with an AP diameter of 6-7 mm at minimum and resultant flattening of the thecal sac. No canal hematoma. No prevertebral soft tissue swelling. No paraspinal fluid collections. Disc levels: Vertebral body height has been preserved. Remaining intervertebral disc height has been preserved. The spinal canal is moderate to severely narrowed at the level of the cervical fusion secondary to posterior disc osteophyte complex ease. The prevertebral soft tissues are not thickened. Review of the axial images demonstrates multilevel uncovertebral arthrosis resulting in mild bilateral neural foraminal narrowing at C3-4 and C6-7, moderate bilateral narrowing at C4-5, and moderate to severe bilateral narrowing at C5-6. Upper chest: Right pleural effusion is identified, not well  assessed on this examination. Other: None significant IMPRESSION: No acute fracture or listhesis of the cervical spine. C4-C6 anterior cervical discectomy infusion. Moderate to severe central canal stenosis at the level of the fused segment secondary to posterior disc osteophyte complexes in combination with congenital narrowing of the spinal canal at this level. Multilevel uncovertebral arthrosis resulting in multilevel neural foraminal narrowing, most severe bilaterally at C5-6. Right pleural effusion. Electronically Signed   By: Fidela Salisbury MD   On: 11/07/2020 04:48   CT ABDOMEN PELVIS W CONTRAST  Result Date: 11/07/2020 CLINICAL DATA:  Diverticulitis, abdominal pain, diarrhea EXAM: CT ABDOMEN AND PELVIS WITH CONTRAST TECHNIQUE: Multidetector CT imaging of the abdomen and pelvis was performed using the standard protocol following bolus administration of intravenous contrast. CONTRAST:  168m OMNIPAQUE IOHEXOL 300 MG/ML  SOLN COMPARISON:  10/24/2020 FINDINGS: Lower chest: Mild to moderate global cardiomegaly. Small left and moderate to large right pleural effusions are present with compressive atelectasis of the right lung base noted. Hepatobiliary: Nodular change of the liver in keeping with cirrhosis again noted. No focal intrahepatic mass identified. Gallbladder unremarkable. No intra or extrahepatic biliary ductal dilation. Pancreas: Unremarkable Spleen: Unremarkable Adrenals/Urinary Tract: The adrenal glands are unremarkable. The kidneys are mildly atrophic bilaterally. Simple cortical cyst seen within the left ovary. Kidneys are otherwise unremarkable. Bladder unremarkable Stomach/Bowel: The colon is diffusely mildly thick walled without significant pericolonic inflammatory stranding suggesting changes of a portal colopathy. Similarly, there is rugal fold thickening and  submucosal edema involving the distal stomach which may reflect changes of portal gastropathy. The stomach, small bowel, and large  bowel are otherwise unremarkable. No evidence of obstruction or focal inflammation. Appendix normal. There is trace mesenteric edema present. No free intraperitoneal gas. Vascular/Lymphatic: Extensive aortoiliac atherosclerotic calcification without evidence of aneurysm. No pathologic adenopathy within the abdomen and pelvis. Reproductive: Prostate is unremarkable. Other: There is diffuse subcutaneous body wall edema noted. Mild retroperitoneal edema within the pelvis noted. There is circumferential asymmetrically more severe wall thickening involving the rectum, possibly representing changes of superimposed proctitis. Mild perirectal edema is present. Musculoskeletal: No acute bone abnormality. No lytic or blastic bone lesion. IMPRESSION: Diffuse mild colonic wall thickening and rugal fold thickening within the distal stomach suggesting changes of portal colopathy and portal gastritis, respectively. Superimposed moderate circumferential wall thickening and mild perirectal edema suggesting superimposed infectious or inflammatory proctitis. Cirrhosis. Anasarca with bilateral pleural effusions, diffuse body wall edema, mesenteric edema, and retroperitoneal edema. Moderate cardiomegaly. Aortic Atherosclerosis (ICD10-I70.0). Electronically Signed   By: Fidela Salisbury MD   On: 11/07/2020 04:57    [place] ASCITES (ABDOMEN LIMITED)  Result Date: 11/10/2020 CLINICAL DATA:  Abdominal distension EXAM: LIMITED ABDOMEN ULTRASOUND FOR ASCITES TECHNIQUE: Limited ultrasound survey for ascites was performed in all four abdominal quadrants. COMPARISON:  None. FINDINGS: Four quadrant examination of the abdomen demonstrates no ascites. Liver has a lobular contour. RIGHT effusion noted. IMPRESSION: 1. No ascites. 2. Lobular liver. 3. RIGHT effusion. Electronically Signed   By: Suzy Bouchard M.D.   On: 11/10/2020 15:28     DISCHARGE EXAMINATION: Vitals:   11/12/20 1324 11/12/20 2136 11/13/20 0439 11/13/20 0442  BP: 126/86  129/87 115/81   Pulse: 67 69 70   Resp: '16 16 14   ' Temp: 97.9 F (36.6 C) 98 F (36.7 C) 97.9 F (36.6 C)   TempSrc: Oral Oral Oral   SpO2: 99% 98% 98%   Weight:    82 kg  Height:       General appearance: Awake alert.  In no distress Resp: Clear to auscultation bilaterally.  Normal effort Cardio: S1-S2 is normal regular.  No S3-S4.  No rubs murmurs or bruit GI: Abdomen is soft.  Nontender nondistended.  Bowel sounds are present normal.  No masses organomegaly Improved edema bilateral lower extremities   DISPOSITION: Home  Discharge Instructions    (HEART FAILURE PATIENTS) Call MD:  Anytime you have any of the following symptoms: 1) 3 pound weight gain in 24 hours or 5 pounds in 1 week 2) shortness of breath, with or without a dry hacking cough 3) swelling in the hands, feet or stomach 4) if you have to sleep on extra pillows at night in order to breathe.   Complete by: As directed    Call MD for:  difficulty breathing, headache or visual disturbances   Complete by: As directed    Call MD for:  extreme fatigue   Complete by: As directed    Call MD for:  persistant dizziness or light-headedness   Complete by: As directed    Call MD for:  persistant nausea and vomiting   Complete by: As directed    Call MD for:  severe uncontrolled pain   Complete by: As directed    Call MD for:  temperature >100.4   Complete by: As directed    Diet - low sodium heart healthy   Complete by: As directed    Discharge instructions   Complete by: As directed  Please be sure to follow-up with your primary care provider for further issues with abdominal pain.  An appointment has been made for you to see cardiology for your congestive heart failure.  Please take your medications as prescribed.  Follow the fluid restrictions as discussed.  You were cared for by a hospitalist during your hospital stay. If you have any questions about your discharge medications or the care you received while you were  in the hospital after you are discharged, you can call the unit and asked to speak with the hospitalist on call if the hospitalist that took care of you is not available. Once you are discharged, your primary care physician will handle any further medical issues. Please note that NO REFILLS for any discharge medications will be authorized once you are discharged, as it is imperative that you return to your primary care physician (or establish a relationship with a primary care physician if you do not have one) for your aftercare needs so that they can reassess your need for medications and monitor your lab values. If you do not have a primary care physician, you can call [phone] for a physician referral.   Increase activity slowly   Complete by: As directed        Allergies as of 11/13/2020      Reactions   Angiotensin Receptor Blockers Anaphylaxis, Other (See Comments)   (Angioedema also with Lisinopril, therefore ARB's are contraindicated)   Lisinopril Anaphylaxis, Swelling   Throat swelling   Pamelor [nortriptyline Hcl] Anaphylaxis, Swelling   Throat swells      Medication List    STOP taking these medications   amiodarone 200 MG tablet Commonly known as: PACERONE     TAKE these medications   Accu-Chek Guide Me w/Device Kit Use as instructed daily   Accu-Chek Guide test strip Generic drug: glucose blood Use as instructed daily   atorvastatin 20 MG tablet Commonly known as: LIPITOR Take 1 tablet (20 mg total) by mouth daily at 6 PM.   dapagliflozin propanediol 10 MG Tabs tablet Commonly known as: FARXIGA Take 1 tablet (10 mg total) by mouth daily before breakfast.   DULoxetine 60 MG capsule Commonly known as: Cymbalta Take 1 capsule (60 mg total) by mouth daily.   folic acid 1 MG tablet Commonly known as: FOLVITE Take 1 mg by mouth daily.   furosemide 40 MG tablet Commonly known as: LASIX Take 31m in the morning and 49min the evening What changed:   how much  to take  how to take this  when to take this  additional instructions   gabapentin 300 MG capsule Commonly known as: NEURONTIN 2 caps in the morning and 3 caps in the evening What changed:   how much to take  how to take this  when to take this  additional instructions   isosorbide-hydrALAZINE 20-37.5 MG tablet Commonly known as: BIDIL Take 1 tablet by mouth 3 (three) times daily.   ondansetron 4 MG tablet Commonly known as: ZOFRAN Take 1 tablet (4 mg total) by mouth every 8 (eight) hours as needed for nausea or vomiting.   Oxycodone HCl 10 MG Tabs Take 1 tablet (10 mg total) by mouth every 4 (four) hours as needed for severe pain.   pantoprazole 40 MG tablet Commonly known as: Protonix Take 1 tablet (40 mg total) by mouth daily.   polyethylene glycol 17 g packet Commonly known as: MIRALAX / GLYCOLAX Take 17 g by mouth 2 (two) times daily.  potassium chloride SA 20 MEQ tablet Commonly known as: KLOR-CON Take 1 tablet (20 mEq total) by mouth daily.   sodium chloride 1 g tablet Take 2 tablets (2 g total) by mouth 3 (three) times daily with meals.   thiamine 100 MG tablet Take 1 tablet (100 mg total) by mouth daily.         Follow-up Information    Erlene Quan, PA-C Follow up on 12/03/2020.   Specialties: Cardiology, Radiology Contact information: [address] [place] [address] [phone]        Charlott Rakes, MD. Schedule an appointment as soon as possible for a visit in 1 week(s).   Specialty: Family Medicine Contact information: [place] [address] [phone]               TOTAL DISCHARGE TIME: 13 minutes  [place]  [facility] Pager on [email or web address]  11/13/2020, 11:33 AM

## 2020-11-13 NOTE — Discharge Instructions (Signed)
Heart Failure, Self Care Heart failure is a serious condition. This sheet explains things you need to do to take care of yourself at home. To help you stay as healthy as possible, you may be asked to change your diet, take certain medicines, and make other changes in your life. Your doctor may also give you more specific instructions. If you have problems or questions, call your doctor. What are the risks? Having heart failure makes it more likely for you to have some problems. These problems can get worse if you do not take good care of yourself. Problems may include:  Blood clotting problems. This may cause a stroke.  Damage to the kidneys, liver, or lungs.  Abnormal heart rhythms. Supplies needed:  Scale for weighing yourself.  Blood pressure monitor.  Notebook.  Medicines. How to care for yourself when you have heart failure Medicines Take over-the-counter and prescription medicines only as told by your doctor. Take your medicines every day.  Do not stop taking your medicine unless your doctor tells you to do so.  Do not skip any medicines.  Get your prescriptions refilled before you run out of medicine. This is important. Eating and drinking   Eat heart-healthy foods. Talk with a diet specialist (dietitian) to create an eating plan.  Choose foods that: ? Have no trans fat. ? Are low in saturated fat and cholesterol.  Choose healthy foods, such as: ? Fresh or frozen fruits and vegetables. ? Fish. ? Low-fat (lean) meats. ? Legumes, such as beans, peas, and lentils. ? Fat-free or low-fat dairy products. ? Whole-grain foods. ? High-fiber foods.  Limit salt (sodium) if told by your doctor. Ask your diet specialist to tell you which seasonings are healthy for your heart.  Cook in healthy ways instead of frying. Healthy ways of cooking include roasting, grilling, broiling, baking, poaching, steaming, and stir-frying.  Limit how much fluid you drink, if told by your  doctor. Alcohol use  Do not drink alcohol if: ? Your doctor tells you not to drink. ? Your heart was damaged by alcohol, or you have very bad heart failure. ? You are pregnant, may be pregnant, or are planning to become pregnant.  If you drink alcohol: ? Limit how much you use to:  0-1 drink a day for women.  0-2 drinks a day for men. ? Be aware of how much alcohol is in your drink. In the [place] one drink equals one 12 oz bottle of beer (355 mL), one 5 oz glass of wine (148 mL), or one 1 oz glass of hard liquor (44 mL). Lifestyle   Do not use any products that contain nicotine or tobacco, such as cigarettes, e-cigarettes, and chewing tobacco. If you need help quitting, ask your doctor. ? Do not use nicotine gum or patches before talking to your doctor.  Do not use illegal drugs.  Lose weight if told by your doctor.  Do physical activity if told by your doctor. Talk to your doctor before you begin an exercise if: ? You are an older adult. ? You have very bad heart failure.  Learn to manage stress. If you need help, ask your doctor.  Get rehab (rehabilitation) to help you stay independent and to help with your quality of life.  Plan time to rest when you get tired. Check weight and blood pressure   Weigh yourself every day. This will help you to know if fluid is building up in your body. ? Weigh yourself every morning   after you pee (urinate) and before you eat breakfast. ? Wear the same amount of clothing each time. ? Write down your daily weight. Give your record to your doctor.  Check and write down your blood pressure as told by your doctor.  Check your pulse as told by your doctor. Dealing with very hot and very cold weather  If it is very hot: ? Avoid activities that take a lot of energy. ? Use air conditioning or fans, or find a cooler place. ? Avoid caffeine and alcohol. ? Wear clothing that is loose-fitting, lightweight, and light-colored.  If it is very  cold: ? Avoid activities that take a lot of energy. ? Layer your clothes. ? Wear mittens or gloves, a hat, and a scarf when you go outside. ? Avoid alcohol. Follow these instructions at home:  Stay up to date with shots (vaccines). Get pneumococcal and flu (influenza) shots.  Keep all follow-up visits as told by your doctor. This is important. Contact a doctor if:  You gain weight quickly.  You have increasing shortness of breath.  You cannot do your normal activities.  You get tired easily.  You cough a lot.  You don't feel like eating or feel like you may vomit (nauseous).  You become puffy (swell) in your hands, feet, ankles, or belly (abdomen).  You cannot sleep well because it is hard to breathe.  You feel like your heart is beating fast (palpitations).  You get dizzy when you stand up. Get help right away if:  You have trouble breathing.  You or someone else notices a change in your behavior, such as having trouble staying awake.  You have chest pain or discomfort.  You pass out (faint). These symptoms may be an emergency. Do not wait to see if the symptoms will go away. Get medical help right away. Call your local emergency services (911 in the [place] Do not drive yourself to the hospital. Summary  Heart failure is a serious condition. To care for yourself, you may have to change your diet, take medicines, and make other lifestyle changes.  Take your medicines every day. Do not stop taking them unless your doctor tells you to do so.  Eat heart-healthy foods, such as fresh or frozen fruits and vegetables, fish, lean meats, legumes, fat-free or low-fat dairy products, and whole-grain or high-fiber foods.  Ask your doctor if you can drink alcohol. You may have to stop alcohol use if you have very bad heart failure.  Contact your doctor if you gain weight quickly or feel that your heart is beating too fast. Get help right away if you pass out, or have chest pain  or trouble breathing. This information is not intended to replace advice given to you by your health care provider. Make sure you discuss any questions you have with your health care provider. Document Revised: 03/22/2019 Document Reviewed: 03/23/2019 Elsevier Patient Education  2020 [employer]  

## 2020-11-14 ENCOUNTER — Ambulatory Visit: Payer: Medicaid Other | Attending: Family Medicine | Admitting: Family Medicine

## 2020-11-14 ENCOUNTER — Telehealth: Payer: Self-pay

## 2020-11-14 ENCOUNTER — Telehealth (HOSPITAL_COMMUNITY): Payer: Self-pay

## 2020-11-14 ENCOUNTER — Other Ambulatory Visit: Payer: Self-pay

## 2020-11-14 DIAGNOSIS — E871 Hypo-osmolality and hyponatremia: Secondary | ICD-10-CM | POA: Diagnosis not present

## 2020-11-14 DIAGNOSIS — I110 Hypertensive heart disease with heart failure: Secondary | ICD-10-CM

## 2020-11-14 DIAGNOSIS — E1122 Type 2 diabetes mellitus with diabetic chronic kidney disease: Secondary | ICD-10-CM | POA: Diagnosis not present

## 2020-11-14 DIAGNOSIS — E1159 Type 2 diabetes mellitus with other circulatory complications: Secondary | ICD-10-CM | POA: Diagnosis not present

## 2020-11-14 DIAGNOSIS — B182 Chronic viral hepatitis C: Secondary | ICD-10-CM

## 2020-11-14 DIAGNOSIS — Z794 Long term (current) use of insulin: Secondary | ICD-10-CM

## 2020-11-14 DIAGNOSIS — N1831 Chronic kidney disease, stage 3a: Secondary | ICD-10-CM

## 2020-11-14 DIAGNOSIS — G8929 Other chronic pain: Secondary | ICD-10-CM

## 2020-11-14 DIAGNOSIS — I5042 Chronic combined systolic (congestive) and diastolic (congestive) heart failure: Secondary | ICD-10-CM

## 2020-11-14 DIAGNOSIS — I130 Hypertensive heart and chronic kidney disease with heart failure and stage 1 through stage 4 chronic kidney disease, or unspecified chronic kidney disease: Secondary | ICD-10-CM

## 2020-11-14 DIAGNOSIS — E1140 Type 2 diabetes mellitus with diabetic neuropathy, unspecified: Secondary | ICD-10-CM

## 2020-11-14 NOTE — Progress Notes (Signed)
Has no concerns.

## 2020-11-14 NOTE — Telephone Encounter (Signed)
Call placed to [place] and he confirmed that he updated the patient's medication bubble pack yesterday with the new order for furosemide that was received -Take 80mg  in the morning and 40mg  in the evening,   and the bubble pack was then returned to the patient.  Call placed to patient and informed him that his bubble pack has been updated with the new order for furosemide and he said okay.

## 2020-11-14 NOTE — Telephone Encounter (Signed)
Spoke to [place] today who reports he is home from hospital and had follow up with PCP today. Brad [name] has follow up with [facility] in December. I will continue to follow him to verify medications. Call complete and I will see him next week.
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
## 2020-11-14 NOTE — Telephone Encounter (Signed)
Transition Care Management Follow-up Telephone Call  Date of discharge and from where: 11/13/2020, [facility]   Call placed to patient. He has a virtual appointment today with Dr Margarita Rana and is waiting for her call.

## 2020-11-19 ENCOUNTER — Telehealth (HOSPITAL_COMMUNITY): Payer: Self-pay

## 2020-11-19 NOTE — Telephone Encounter (Signed)
Left message for [place] to return my call in reference to attempting to make home visit. I spoke to [place]an[place] Emms roommate who reports Sue wasn't home but would pass my message along. I will continue to reach out.

## 2020-11-20 ENCOUNTER — Telehealth (HOSPITAL_COMMUNITY): Payer: Self-pay

## 2020-11-20 NOTE — Telephone Encounter (Signed)
Attempted to reach [place] today with no success. I will continue to reach out.

## 2020-11-21 ENCOUNTER — Other Ambulatory Visit: Payer: Medicaid Other

## 2020-11-26 ENCOUNTER — Emergency Department (HOSPITAL_COMMUNITY): Payer: Medicaid Other

## 2020-11-26 ENCOUNTER — Other Ambulatory Visit: Payer: Self-pay

## 2020-11-26 ENCOUNTER — Encounter (HOSPITAL_COMMUNITY): Payer: Self-pay | Admitting: Radiology

## 2020-11-26 ENCOUNTER — Emergency Department (HOSPITAL_COMMUNITY)
Admission: EM | Admit: 2020-11-26 | Discharge: 2020-11-26 | Disposition: A | Discharge status: Home / Self Care | Payer: Medicaid Other | Attending: Emergency Medicine | Admitting: Emergency Medicine

## 2020-11-26 DIAGNOSIS — S0990XA Unspecified injury of head, initial encounter: Secondary | ICD-10-CM | POA: Diagnosis not present

## 2020-11-26 DIAGNOSIS — M542 Cervicalgia: Secondary | ICD-10-CM | POA: Insufficient documentation

## 2020-11-26 DIAGNOSIS — F17210 Nicotine dependence, cigarettes, uncomplicated: Secondary | ICD-10-CM | POA: Diagnosis not present

## 2020-11-26 DIAGNOSIS — I5042 Chronic combined systolic (congestive) and diastolic (congestive) heart failure: Secondary | ICD-10-CM | POA: Insufficient documentation

## 2020-11-26 DIAGNOSIS — Z8546 Personal history of malignant neoplasm of prostate: Secondary | ICD-10-CM | POA: Diagnosis not present

## 2020-11-26 DIAGNOSIS — R109 Unspecified abdominal pain: Secondary | ICD-10-CM

## 2020-11-26 DIAGNOSIS — I130 Hypertensive heart and chronic kidney disease with heart failure and stage 1 through stage 4 chronic kidney disease, or unspecified chronic kidney disease: Secondary | ICD-10-CM | POA: Insufficient documentation

## 2020-11-26 DIAGNOSIS — N183 Chronic kidney disease, stage 3 unspecified: Secondary | ICD-10-CM | POA: Diagnosis not present

## 2020-11-26 DIAGNOSIS — W19XXXA Unspecified fall, initial encounter: Secondary | ICD-10-CM | POA: Diagnosis not present

## 2020-11-26 DIAGNOSIS — E1122 Type 2 diabetes mellitus with diabetic chronic kidney disease: Secondary | ICD-10-CM | POA: Diagnosis not present

## 2020-11-26 LAB — CBC
HCT: 39 % (ref 39.0–52.0)
Hemoglobin: 12.9 g/dL — ABNORMAL LOW (ref 13.0–17.0)
MCH: 27.9 pg (ref 26.0–34.0)
MCHC: 33.1 g/dL (ref 30.0–36.0)
MCV: 84.2 fL (ref 80.0–100.0)
Platelets: 316 10*3/uL (ref 150–400)
RBC: 4.63 MIL/uL (ref 4.22–5.81)
RDW: 16.2 % — ABNORMAL HIGH (ref 11.5–15.5)
WBC: 3.4 10*3/uL — ABNORMAL LOW (ref 4.0–10.5)
nRBC: 0 % (ref 0.0–0.2)

## 2020-11-26 LAB — LIPASE, BLOOD: Lipase: 35 U/L (ref 11–51)

## 2020-11-26 LAB — COMPREHENSIVE METABOLIC PANEL
ALT: 35 U/L (ref 0–44)
AST: 50 U/L — ABNORMAL HIGH (ref 15–41)
Albumin: 3.2 g/dL — ABNORMAL LOW (ref 3.5–5.0)
Alkaline Phosphatase: 76 U/L (ref 38–126)
Anion gap: 14 (ref 5–15)
BUN: 10 mg/dL (ref 6–20)
CO2: 28 mmol/L (ref 22–32)
Calcium: 8.8 mg/dL — ABNORMAL LOW (ref 8.9–10.3)
Chloride: 88 mmol/L — ABNORMAL LOW (ref 98–111)
Creatinine, Ser: 1.22 mg/dL (ref 0.61–1.24)
GFR, Estimated: >60 mL/min (ref >60)
Glucose, Bld: 148 mg/dL — ABNORMAL HIGH (ref 70–99)
Potassium: 3.7 mmol/L (ref 3.5–5.1)
Sodium: 130 mmol/L — ABNORMAL LOW (ref 135–145)
Total Bilirubin: 1 mg/dL (ref 0.3–1.2)
Total Protein: 7.5 g/dL (ref 6.5–8.1)

## 2020-11-26 LAB — BRAIN NATRIURETIC PEPTIDE: B Natriuretic Peptide: 579.4 pg/mL — ABNORMAL HIGH (ref 0.0–100.0)

## 2020-11-26 LAB — PROTIME-INR
INR: 1 (ref 0.8–1.2)
Prothrombin Time: 12.4 seconds (ref 11.4–15.2)

## 2020-11-26 LAB — LACTIC ACID, PLASMA: Lactic Acid, Venous: 1.8 mmol/L (ref 0.5–1.9)

## 2020-11-26 MED ORDER — HYDROMORPHONE HCL 1 MG/ML IJ SOLN
1.0000 mg | Freq: Once | INTRAMUSCULAR | Status: AC
  Administered 2020-11-26: 1 mg via INTRAMUSCULAR
  Filled 2020-11-26: qty 1
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
## 2020-11-26 NOTE — Discharge Instructions (Addendum)
You were evaluated in the Emergency Department and after careful evaluation, we did not find any emergent condition requiring admission or further testing in the hospital.  Your exam/testing today is overall reassuring.  No serious injuries on your CT scans today.  Please keep taking your normal medicines and follow-up closely with your primary care doctor.  Please return to the Emergency Department if you experience any worsening of your condition.   Thank you for allowing [place] to be a part of your care.

## 2020-11-26 NOTE — ED Notes (Signed)
During assessment bed bugs found on patient. Bugs in container in room.

## 2020-11-26 NOTE — ED Triage Notes (Addendum)
Per [facility], Possible bed bugs, fell 4 days ago and did not got to hospital. Today has blurred vision and headache. Pt has a scab on back of head. 130/82 HR 88  R16  CBG 164

## 2020-11-26 NOTE — ED Notes (Signed)
RN attempted stick x2 without success. Patient has swelling to right arm and left

## 2020-12-03 ENCOUNTER — Ambulatory Visit: Payer: Medicaid Other | Admitting: Cardiology

## 2020-12-03 ENCOUNTER — Telehealth (HOSPITAL_COMMUNITY): Payer: Self-pay

## 2020-12-03 NOTE — Telephone Encounter (Signed)
Left message with Brad [name] to remind him of his appointment with [facility] today at [place]. No success at speaking with him. I will continue to follow up.

## 2020-12-04 ENCOUNTER — Telehealth: Payer: Self-pay

## 2020-12-04 NOTE — Telephone Encounter (Signed)
[facility] Patient Teacher, English as a foreign language completed.    The patient is insured through [place] and has a $3.00 copay. Prescription ([place]) will call for a PA.   We will continue to follow to see if copay assistance is needed.  Brad [name], [facility] Patient [facility] for Infectious Disease Phone: [phone] Fax:  [phone]

## 2020-12-05 ENCOUNTER — Telehealth: Payer: Self-pay | Admitting: *Deleted

## 2020-12-05 ENCOUNTER — Telehealth (HOSPITAL_COMMUNITY): Payer: Self-pay

## 2020-12-05 NOTE — Telephone Encounter (Signed)
Patient is referred to [place] for direct screening colonoscopy. Echo on 08/01/2020 shows EF 30-35%, no AV stenosis. Per [place] policy he will need colon at hospital but he will need OFFICE VISIT first. Called patient, no answer. Left a message for the patient to call us back to make an office visit appointment.  Pre-visit on 12/28 will need to be cancelled.
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
## 2020-12-06 ENCOUNTER — Emergency Department (HOSPITAL_COMMUNITY)
Admission: EM | Admit: 2020-12-06 | Discharge: 2020-12-07 | Disposition: A | Discharge status: Home / Self Care | Payer: Medicaid Other | Attending: Emergency Medicine | Admitting: Emergency Medicine

## 2020-12-06 ENCOUNTER — Encounter (HOSPITAL_COMMUNITY): Payer: Self-pay

## 2020-12-06 DIAGNOSIS — E1140 Type 2 diabetes mellitus with diabetic neuropathy, unspecified: Secondary | ICD-10-CM | POA: Diagnosis not present

## 2020-12-06 DIAGNOSIS — N183 Chronic kidney disease, stage 3 unspecified: Secondary | ICD-10-CM | POA: Diagnosis not present

## 2020-12-06 DIAGNOSIS — Z8546 Personal history of malignant neoplasm of prostate: Secondary | ICD-10-CM | POA: Diagnosis not present

## 2020-12-06 DIAGNOSIS — E1122 Type 2 diabetes mellitus with diabetic chronic kidney disease: Secondary | ICD-10-CM | POA: Insufficient documentation

## 2020-12-06 DIAGNOSIS — Z20822 Contact with and (suspected) exposure to covid-19: Secondary | ICD-10-CM | POA: Insufficient documentation

## 2020-12-06 DIAGNOSIS — M542 Cervicalgia: Secondary | ICD-10-CM | POA: Insufficient documentation

## 2020-12-06 DIAGNOSIS — I4891 Unspecified atrial fibrillation: Secondary | ICD-10-CM | POA: Diagnosis not present

## 2020-12-06 DIAGNOSIS — F17210 Nicotine dependence, cigarettes, uncomplicated: Secondary | ICD-10-CM | POA: Insufficient documentation

## 2020-12-06 DIAGNOSIS — I130 Hypertensive heart and chronic kidney disease with heart failure and stage 1 through stage 4 chronic kidney disease, or unspecified chronic kidney disease: Secondary | ICD-10-CM | POA: Insufficient documentation

## 2020-12-06 DIAGNOSIS — R202 Paresthesia of skin: Secondary | ICD-10-CM

## 2020-12-06 DIAGNOSIS — I5042 Chronic combined systolic (congestive) and diastolic (congestive) heart failure: Secondary | ICD-10-CM | POA: Insufficient documentation

## 2020-12-06 DIAGNOSIS — Z79899 Other long term (current) drug therapy: Secondary | ICD-10-CM | POA: Insufficient documentation

## 2020-12-06 NOTE — ED Triage Notes (Signed)
Pt presents with c/o neck and back pain for 1 day. Pt also c/o some weakness.

## 2020-12-07 ENCOUNTER — Encounter: Payer: Medicaid Other | Admitting: Internal Medicine

## 2020-12-07 ENCOUNTER — Emergency Department (HOSPITAL_COMMUNITY): Payer: Medicaid Other

## 2020-12-07 ENCOUNTER — Other Ambulatory Visit: Payer: Self-pay

## 2020-12-07 DIAGNOSIS — I130 Hypertensive heart and chronic kidney disease with heart failure and stage 1 through stage 4 chronic kidney disease, or unspecified chronic kidney disease: Secondary | ICD-10-CM | POA: Diagnosis not present

## 2020-12-07 DIAGNOSIS — Z8546 Personal history of malignant neoplasm of prostate: Secondary | ICD-10-CM | POA: Diagnosis not present

## 2020-12-07 DIAGNOSIS — I4891 Unspecified atrial fibrillation: Secondary | ICD-10-CM | POA: Diagnosis not present

## 2020-12-07 DIAGNOSIS — E1140 Type 2 diabetes mellitus with diabetic neuropathy, unspecified: Secondary | ICD-10-CM | POA: Diagnosis not present

## 2020-12-07 DIAGNOSIS — Z20822 Contact with and (suspected) exposure to covid-19: Secondary | ICD-10-CM | POA: Diagnosis not present

## 2020-12-07 DIAGNOSIS — E1122 Type 2 diabetes mellitus with diabetic chronic kidney disease: Secondary | ICD-10-CM | POA: Diagnosis not present

## 2020-12-07 DIAGNOSIS — M542 Cervicalgia: Secondary | ICD-10-CM | POA: Diagnosis present

## 2020-12-07 DIAGNOSIS — F17210 Nicotine dependence, cigarettes, uncomplicated: Secondary | ICD-10-CM | POA: Diagnosis not present

## 2020-12-07 DIAGNOSIS — I5042 Chronic combined systolic (congestive) and diastolic (congestive) heart failure: Secondary | ICD-10-CM | POA: Diagnosis not present

## 2020-12-07 DIAGNOSIS — Z79899 Other long term (current) drug therapy: Secondary | ICD-10-CM | POA: Diagnosis not present

## 2020-12-07 DIAGNOSIS — N183 Chronic kidney disease, stage 3 unspecified: Secondary | ICD-10-CM | POA: Diagnosis not present

## 2020-12-07 LAB — CBC WITH DIFFERENTIAL/PLATELET
Abs Immature Granulocytes: 0 10*3/uL (ref 0.00–0.07)
Basophils Absolute: 0 10*3/uL (ref 0.0–0.1)
Basophils Relative: 1 %
Eosinophils Absolute: 0.1 10*3/uL (ref 0.0–0.5)
Eosinophils Relative: 2 %
HCT: 36.2 % — ABNORMAL LOW (ref 39.0–52.0)
Hemoglobin: 12.1 g/dL — ABNORMAL LOW (ref 13.0–17.0)
Immature Granulocytes: 0 %
Lymphocytes Relative: 12 %
Lymphs Abs: 0.4 10*3/uL — ABNORMAL LOW (ref 0.7–4.0)
MCH: 28.1 pg (ref 26.0–34.0)
MCHC: 33.4 g/dL (ref 30.0–36.0)
MCV: 84.2 fL (ref 80.0–100.0)
Monocytes Absolute: 0.5 10*3/uL (ref 0.1–1.0)
Monocytes Relative: 17 %
Neutro Abs: 2 10*3/uL (ref 1.7–7.7)
Neutrophils Relative %: 68 %
Platelets: 238 10*3/uL (ref 150–400)
RBC: 4.3 MIL/uL (ref 4.22–5.81)
RDW: 16.7 % — ABNORMAL HIGH (ref 11.5–15.5)
WBC: 3 10*3/uL — ABNORMAL LOW (ref 4.0–10.5)
nRBC: 0 % (ref 0.0–0.2)

## 2020-12-07 LAB — ETHANOL: Alcohol, Ethyl (B): 146 mg/dL — ABNORMAL HIGH (ref <10)

## 2020-12-07 LAB — COMPREHENSIVE METABOLIC PANEL
ALT: 29 U/L (ref 0–44)
AST: 36 U/L (ref 15–41)
Albumin: 3.1 g/dL — ABNORMAL LOW (ref 3.5–5.0)
Alkaline Phosphatase: 73 U/L (ref 38–126)
Anion gap: 12 (ref 5–15)
BUN: 11 mg/dL (ref 8–23)
CO2: 22 mmol/L (ref 22–32)
Calcium: 8.2 mg/dL — ABNORMAL LOW (ref 8.9–10.3)
Chloride: 89 mmol/L — ABNORMAL LOW (ref 98–111)
Creatinine, Ser: 1.1 mg/dL (ref 0.61–1.24)
GFR, Estimated: >60 mL/min (ref >60)
Glucose, Bld: 153 mg/dL — ABNORMAL HIGH (ref 70–99)
Potassium: 4.3 mmol/L (ref 3.5–5.1)
Sodium: 123 mmol/L — ABNORMAL LOW (ref 135–145)
Total Bilirubin: 0.8 mg/dL (ref 0.3–1.2)
Total Protein: 7.2 g/dL (ref 6.5–8.1)

## 2020-12-07 LAB — MAGNESIUM: Magnesium: 1.6 mg/dL — ABNORMAL LOW (ref 1.7–2.4)

## 2020-12-07 MED ORDER — SODIUM CHLORIDE 0.9 % IV SOLN
INTRAVENOUS | Status: DC
  Administered 2020-12-07: 100 mL/h via INTRAVENOUS

## 2020-12-07 MED ORDER — THIAMINE HCL 100 MG PO TABS: 100.0000 mg | ORAL_TABLET | Freq: Every day | ORAL | Status: DC

## 2020-12-07 MED ORDER — OXYCODONE-ACETAMINOPHEN 5-325 MG PO TABS: 1.0000 | ORAL_TABLET | ORAL | 0 refills | Status: DC | PRN

## 2020-12-07 MED ORDER — LORAZEPAM 1 MG PO TABS: 0.0000 mg | ORAL_TABLET | Freq: Two times a day (BID) | ORAL | Status: DC

## 2020-12-07 MED ORDER — MORPHINE SULFATE (PF) 4 MG/ML IV SOLN
4.0000 mg | Freq: Once | INTRAVENOUS | Status: AC
  Administered 2020-12-07: 4 mg via INTRAVENOUS
  Filled 2020-12-07: qty 1

## 2020-12-07 MED ORDER — ONDANSETRON HCL 4 MG/2ML IJ SOLN
4.0000 mg | Freq: Once | INTRAMUSCULAR | Status: AC
  Administered 2020-12-07: 4 mg via INTRAVENOUS
  Filled 2020-12-07: qty 2

## 2020-12-07 MED ORDER — LORAZEPAM 2 MG/ML IJ SOLN: 0.0000 mg | Freq: Two times a day (BID) | INTRAMUSCULAR | Status: DC

## 2020-12-07 MED ORDER — LORAZEPAM 2 MG/ML IJ SOLN
0.0000 mg | Freq: Four times a day (QID) | INTRAMUSCULAR | Status: DC
  Administered 2020-12-07 (×2): 2 mg via INTRAVENOUS
  Filled 2020-12-07 (×2): qty 1

## 2020-12-07 MED ORDER — LORAZEPAM 1 MG PO TABS
0.0000 mg | ORAL_TABLET | Freq: Four times a day (QID) | ORAL | Status: DC
  Filled 2020-12-07: qty 1

## 2020-12-07 MED ORDER — THIAMINE HCL 100 MG/ML IJ SOLN: 100.0000 mg | Freq: Every day | INTRAMUSCULAR | Status: DC

## 2020-12-07 MED ORDER — GADOBUTROL 1 MMOL/ML IV SOLN
8.0000 mL | Freq: Once | INTRAVENOUS | Status: AC | PRN
  Administered 2020-12-07: 8 mL via INTRAVENOUS

## 2020-12-07 NOTE — ED Notes (Signed)
Patient returned from MRI.

## 2020-12-07 NOTE — ED Notes (Signed)
Patient transported to MRI 

## 2020-12-07 NOTE — ED Provider Notes (Signed)
Pt's strength is back to nl and he is not incontinent.  He was d/w Dr. Reatha Armour (NS) who will follow up as an outpatient.  Pt's MRI is of poor quality as he was moving and he will order one with sedation as an outpatient if necessary.  Pt is concerned about his pain.  He was given a rx for 14 percocets on 11/23. I will give him a rx for 5 percocet pills.  He knows to return if worse.  F/u with Dr. Reatha Armour.   Isla Pence, MD 12/07/20 [phone]

## 2020-12-07 NOTE — ED Notes (Signed)
[facility] Neurosurgery (Dr Dawley) to Dr Leonette Monarch

## 2020-12-07 NOTE — ED Provider Notes (Signed)
I assumed care of this patient.  Please see previous provider note for further details of Hx, PE.  Briefly patient is a 59 y.o. male who presented as a transfer from [facility].  Patient presented with worsening neck pain and symptoms of radiculopathy with reported bladder/bowel incontinence.  Prior provider noted diminished strength in all four extremities.  No hyperreflexia or clonus.  Sent here for MRI to rule out cervical stenosis/cord impingement.   Patient with a history of IV drug use.  MRI also evaluate for epidural abscess.  Patient placed under CIWA.  On my evaluation patient is moving all extremities equally and without difficulty.  Immediately requests medicine for claustrophobia for the MRI.   5:30 AM MRI quality was poor due to motion artifact even with anxiolytics.  I reexamined the patient and he has 5 out of 5 strength in all 4 extremities.  Patient was able to void in the urinal.  He has no bowel or bladder incontinence.  I consulted neurosurgery, Dr. Reatha Armour who performed the patient's ACDF.  6:40 AM Spoke with Dr. Reatha Armour regarding the patient's presentation and my findings.  He will review the MRI and call back with recommendations.      Fatima Blank, MD 12/08/20 [phone]

## 2020-12-07 NOTE — ED Notes (Signed)
[facility] arrived and transporting patient to [facility].
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
## 2020-12-07 NOTE — ED Notes (Signed)
Assumed care of patient. Patient transferred from [facility] for MRI. Patient with weakness and back pain that started yesterday. Alert and oriented. Patient received 4mg  morphine pta.

## 2020-12-11 LAB — RESP PANEL BY RT-PCR (FLU A&B, COVID) ARPGX2
Influenza A by PCR: NEGATIVE
Influenza B by PCR: NEGATIVE
SARS Coronavirus 2 by RT PCR: NEGATIVE

## 2020-12-13 NOTE — Telephone Encounter (Signed)
Office visit was made with Dr.Perry.

## 2020-12-18 ENCOUNTER — Encounter: Payer: Medicaid Other | Admitting: Family

## 2020-12-18 ENCOUNTER — Telehealth (HOSPITAL_COMMUNITY): Payer: Self-pay

## 2020-12-18 NOTE — Telephone Encounter (Signed)
Called to set up home visit, no answer on phone call. Message left. I will follow up.

## 2021-01-01 ENCOUNTER — Telehealth (HOSPITAL_COMMUNITY): Payer: Self-pay

## 2021-01-01 ENCOUNTER — Encounter: Payer: Medicaid Other | Admitting: Internal Medicine

## 2021-01-01 NOTE — Telephone Encounter (Signed)
I have left multiple messages with the patient in regards to home visit with no return of call. I will discuss this with [facility] staff for plan of care.

## 2021-01-02 IMAGING — CT CT CTA ABD/PEL W/CM AND/OR W/O CM
2 of 12 series · 11 of 46 positions shown, 17 images · IV contrast (omnipaque)
Comparison: August 22, 2019

CLINICAL DATA: Abdominal pain. History of atrial fibrillation.
Concern for mesenteric ischemia.

EXAM:
CTA ABDOMEN AND PELVIS WITHOUT AND WITH CONTRAST
TECHNIQUE: Multidetector CT imaging of the abdomen and pelvis was performed
using the standard protocol during bolus administration of
intravenous contrast. Multiplanar reconstructed images and MIPs were
obtained and reviewed to evaluate the vascular anatomy.
CONTRAST:  100mL OMNIPAQUE IOHEXOL 350 MG/ML SOLN

[Series 9: venous thins · axial · portal-venous · 0.71mm/px · z∈[-523,-138]mm · 9 of 1175 slices shown, 15 images]
[im 107/1175  soft-tissue]
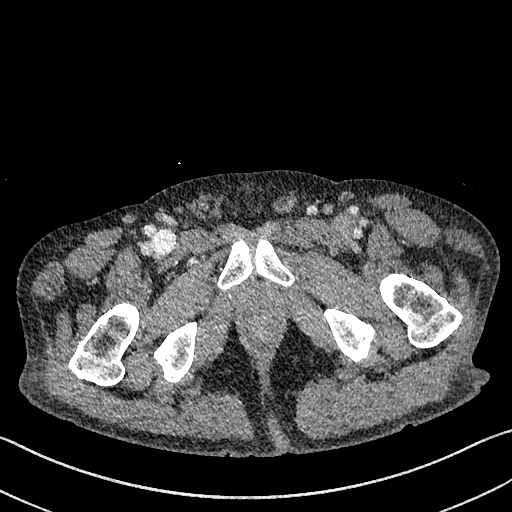
[im 107/1175  bone]
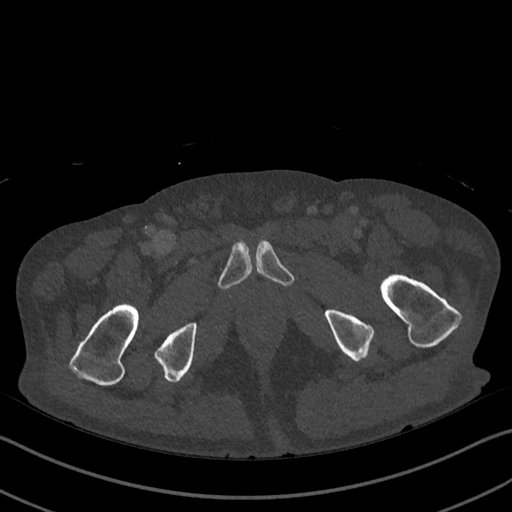
[im 214/1175  soft-tissue]
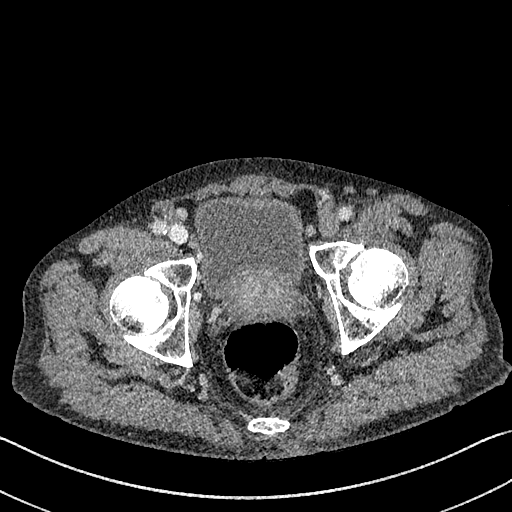
[im 321/1175  soft-tissue]
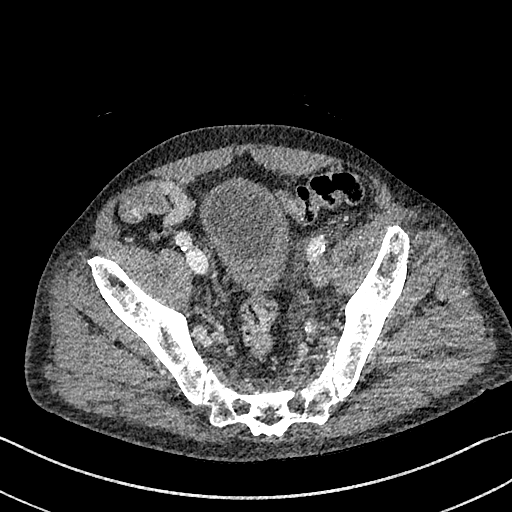
[im 427/1175  soft-tissue]
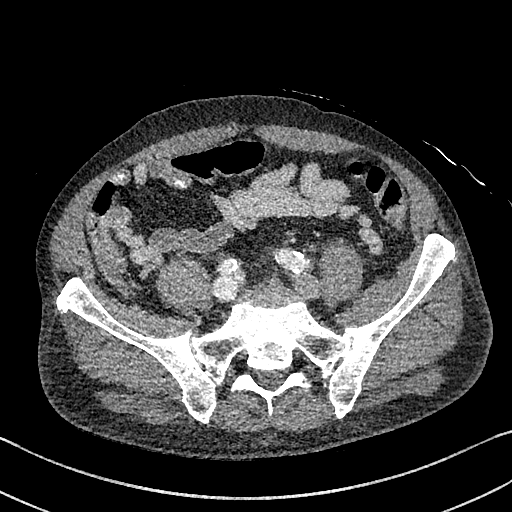
[im 641/1175  soft-tissue]
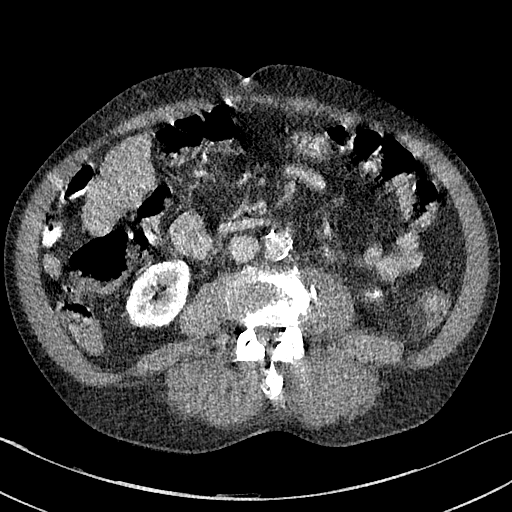
[im 748/1175  soft-tissue]
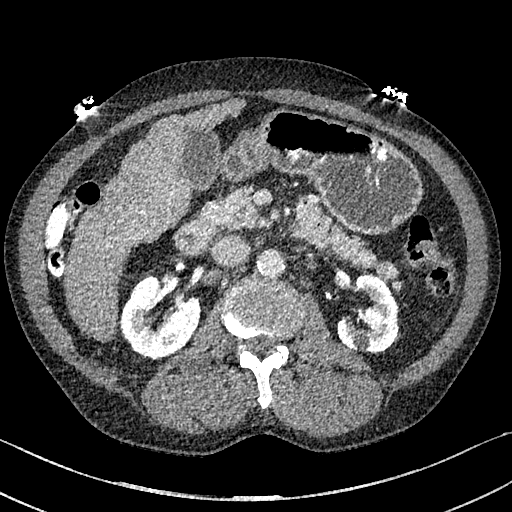
[im 748/1175  lung]
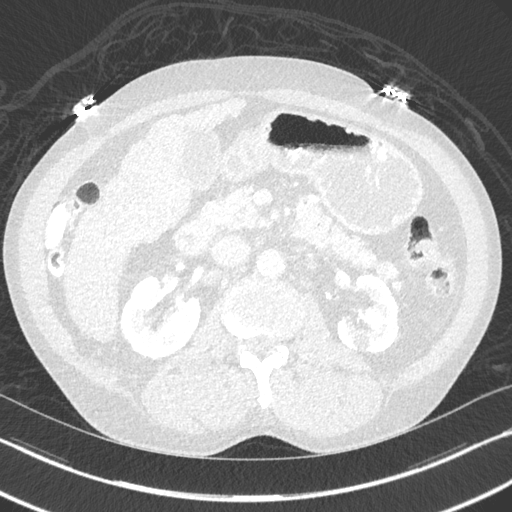
[im 854/1175  soft-tissue]
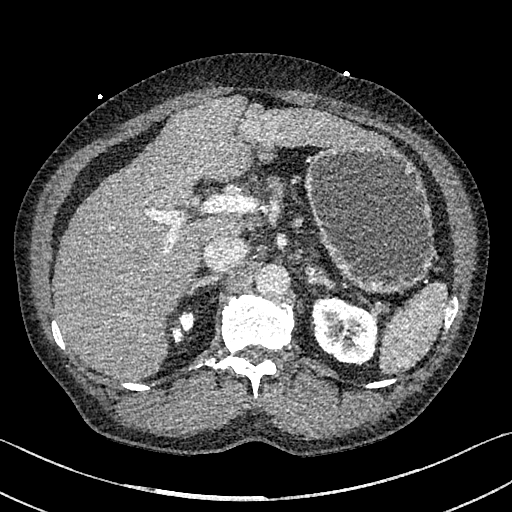
[im 854/1175  lung]
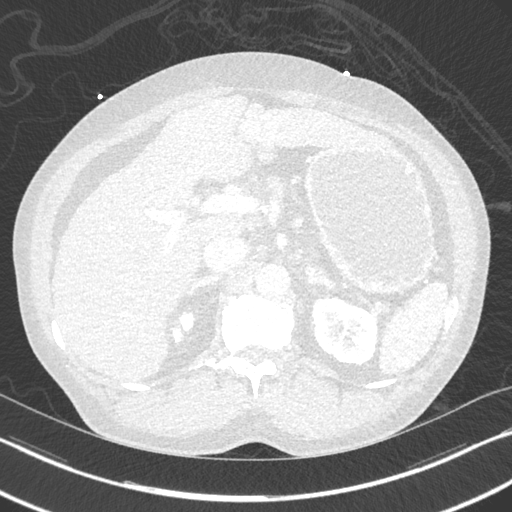
[im 961/1175  soft-tissue]
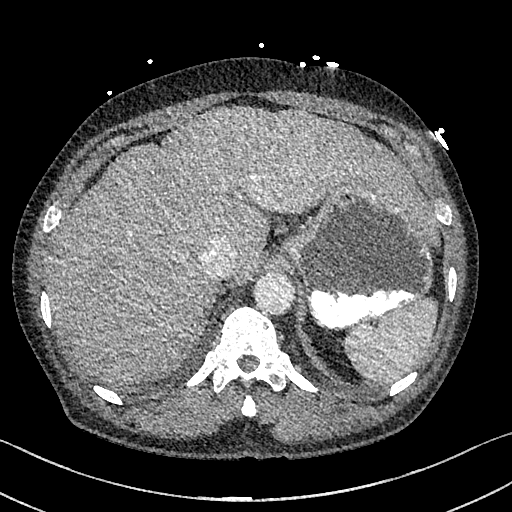
[im 961/1175  lung]
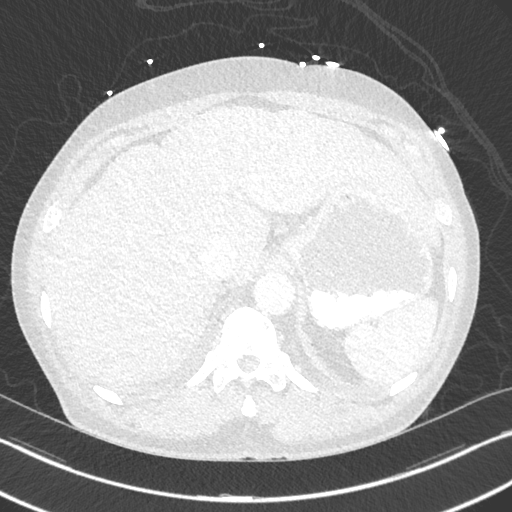
[im 1068/1175  soft-tissue]
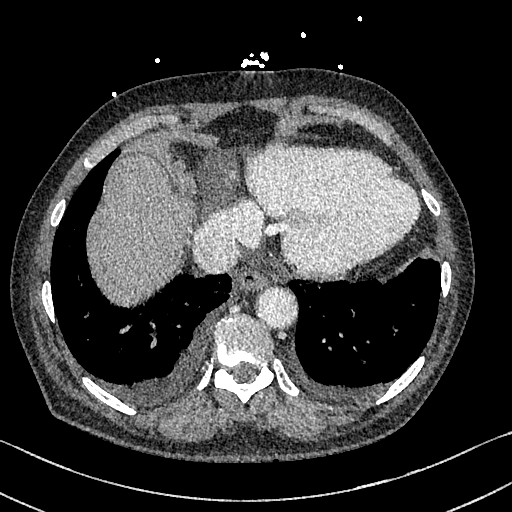
[im 1068/1175  lung]
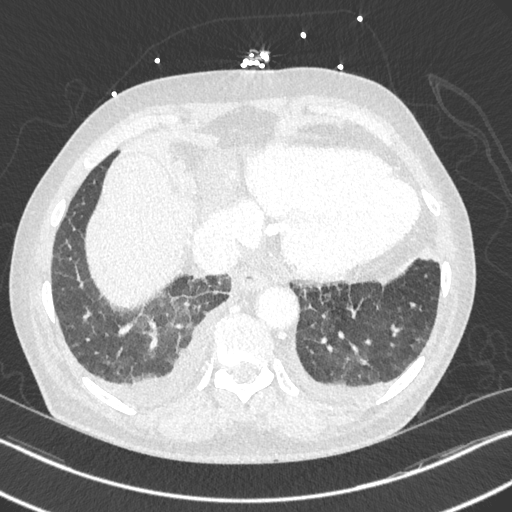
[im 1068/1175  bone]
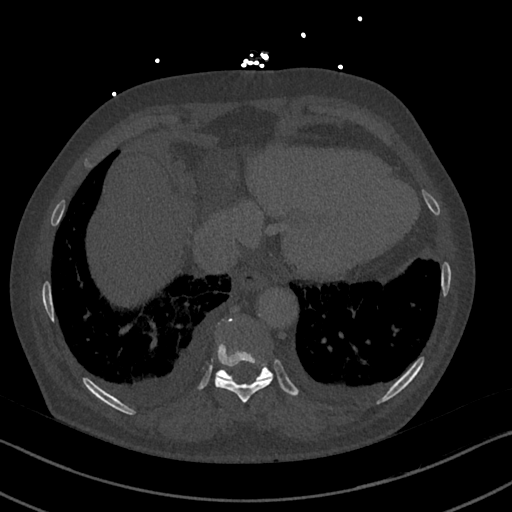

[Series 11: cor · coronal · 0.72mm/px · 2 of 150 slices shown]
[im 50/150  soft-tissue]
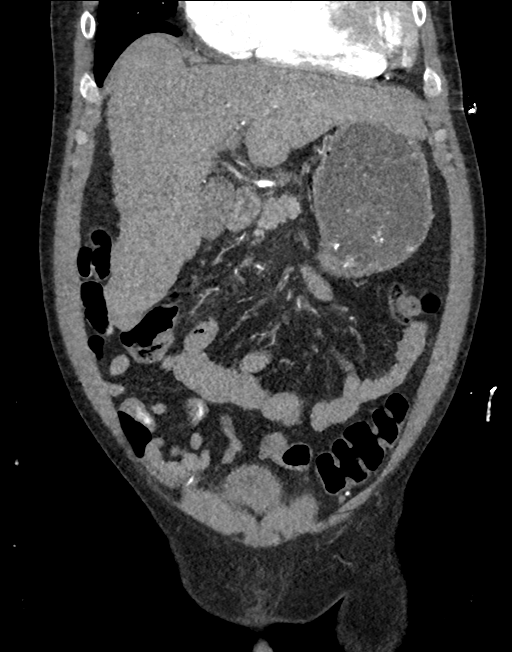
[im 100/150  soft-tissue]
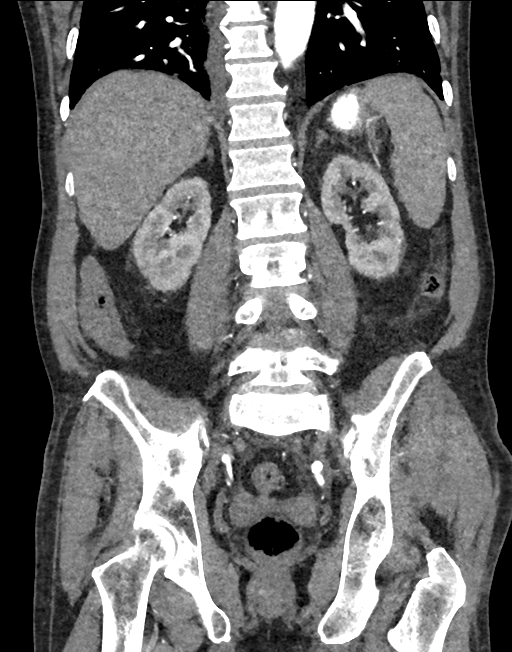

[11 of 46 positions shown; findings below may reference images not displayed]

FINDINGS: VASCULAR

Aorta: Atherosclerotic changes are seen in the thoracic aorta. No
aneurysm identified. No dissection identified.

Celiac: Patent without evidence of aneurysm, dissection, vasculitis
or significant stenosis.

SMA: Patent without evidence of aneurysm, dissection, vasculitis or
significant stenosis.

Renals: Both renal arteries are patent without evidence of aneurysm,
dissection, vasculitis, fibromuscular dysplasia or significant
stenosis.

IMA: Patent without evidence of aneurysm, dissection, vasculitis or
significant stenosis.

Inflow: Patent without evidence of aneurysm, dissection, vasculitis
or significant stenosis.

Proximal Outflow: Bilateral common femoral and visualized portions
of the superficial and profunda femoral arteries are patent without
evidence of aneurysm, dissection, vasculitis or significant
stenosis.

Veins: No obvious venous abnormality within the limitations of this
arterial phase study.

Review of the MIP images confirms the above findings.

NON-VASCULAR

Lower chest: Bilateral pleural effusions with atelectasis are
identified, right greater than left. Cardiomegaly.

Hepatobiliary: There is a nodular contour to the liver consistent
with with cirrhosis. No masses are seen in the liver on arterial or
venous phases. The gallbladder wall is mildly prominent but stable,
likely due to underlying liver disease. The portal vein is patent.

Pancreas: Unremarkable. No pancreatic ductal dilatation or
surrounding inflammatory changes.

Spleen: Normal in size without focal abnormality.

Adrenals/Urinary Tract: Adrenal glands are unremarkable. No
suspicious renal masses identified. There is a cyst on the left.
Another cyst is seen in the lower pole on the left. No perinephric
stranding or hydronephrosis. The ureters are unremarkable. The
bladder is somewhat thick wall, possibly due to less distension in
the interval. No suspicious masses.

Stomach/Bowel: The stomach and small bowel are normal. Colon and
visualized appendix are normal.

Lymphatic: No significant vascular findings are present. No enlarged
abdominal or pelvic lymph nodes.

Reproductive: Prostate is unremarkable.

Other: Haziness in the mesenteric and retroperitoneal fat is
identified, extending into the pelvis. The finding was also seen on
the previous study but is mildly more pronounced today. No free air
identified.

Musculoskeletal: No acute or significant osseous findings.
IMPRESSION: VASCULAR

1. No aneurysm or dissection.  Atherosclerotic changes

NON-VASCULAR

1. Cirrhosis.
2. Increased attenuation in the fat of the mesentery and peritoneum
is likely due to volume overload.
3. Bilateral pleural effusions.  Cardiomegaly.
4. The bladder is somewhat thick walled, possibly due to less
distension in the interval. Recommend clinical correlation to
exclude signs of cystitis.
5. The gallbladder wall is prominent, probably due to cirrhosis as
the finding is stable. Recommend clinical correlation.

## 2021-01-14 ENCOUNTER — Telehealth (HOSPITAL_COMMUNITY): Payer: Self-pay

## 2021-01-14 NOTE — Telephone Encounter (Signed)
Left message for Brad [name] to reach out for home visit. Will continue to follow.

## 2021-01-15 ENCOUNTER — Other Ambulatory Visit: Payer: Self-pay

## 2021-01-15 ENCOUNTER — Encounter (HOSPITAL_COMMUNITY): Payer: Self-pay

## 2021-01-15 ENCOUNTER — Emergency Department (HOSPITAL_COMMUNITY)
Admission: EM | Admit: 2021-01-15 | Discharge: 2021-01-15 | Disposition: A | Discharge status: Home / Self Care | Payer: Medicaid Other | Attending: Emergency Medicine | Admitting: Emergency Medicine

## 2021-01-15 ENCOUNTER — Emergency Department (HOSPITAL_COMMUNITY): Payer: Medicaid Other

## 2021-01-15 DIAGNOSIS — I130 Hypertensive heart and chronic kidney disease with heart failure and stage 1 through stage 4 chronic kidney disease, or unspecified chronic kidney disease: Secondary | ICD-10-CM | POA: Insufficient documentation

## 2021-01-15 DIAGNOSIS — I5042 Chronic combined systolic (congestive) and diastolic (congestive) heart failure: Secondary | ICD-10-CM | POA: Diagnosis not present

## 2021-01-15 DIAGNOSIS — E871 Hypo-osmolality and hyponatremia: Secondary | ICD-10-CM

## 2021-01-15 DIAGNOSIS — F17210 Nicotine dependence, cigarettes, uncomplicated: Secondary | ICD-10-CM | POA: Insufficient documentation

## 2021-01-15 DIAGNOSIS — Z8546 Personal history of malignant neoplasm of prostate: Secondary | ICD-10-CM | POA: Diagnosis not present

## 2021-01-15 DIAGNOSIS — I2510 Atherosclerotic heart disease of native coronary artery without angina pectoris: Secondary | ICD-10-CM | POA: Insufficient documentation

## 2021-01-15 DIAGNOSIS — N1831 Chronic kidney disease, stage 3a: Secondary | ICD-10-CM | POA: Insufficient documentation

## 2021-01-15 DIAGNOSIS — E1122 Type 2 diabetes mellitus with diabetic chronic kidney disease: Secondary | ICD-10-CM | POA: Diagnosis not present

## 2021-01-15 DIAGNOSIS — E1140 Type 2 diabetes mellitus with diabetic neuropathy, unspecified: Secondary | ICD-10-CM | POA: Diagnosis not present

## 2021-01-15 DIAGNOSIS — Z96652 Presence of left artificial knee joint: Secondary | ICD-10-CM | POA: Diagnosis not present

## 2021-01-15 DIAGNOSIS — I509 Heart failure, unspecified: Secondary | ICD-10-CM

## 2021-01-15 DIAGNOSIS — Z20822 Contact with and (suspected) exposure to covid-19: Secondary | ICD-10-CM | POA: Diagnosis not present

## 2021-01-15 DIAGNOSIS — R0602 Shortness of breath: Secondary | ICD-10-CM | POA: Diagnosis present

## 2021-01-15 LAB — BASIC METABOLIC PANEL
Anion gap: 11 (ref 5–15)
BUN: 10 mg/dL (ref 8–23)
CO2: 23 mmol/L (ref 22–32)
Calcium: 8.2 mg/dL — ABNORMAL LOW (ref 8.9–10.3)
Chloride: 87 mmol/L — ABNORMAL LOW (ref 98–111)
Creatinine, Ser: 0.97 mg/dL (ref 0.61–1.24)
GFR, Estimated: >60 mL/min (ref >60)
Glucose, Bld: 145 mg/dL — ABNORMAL HIGH (ref 70–99)
Potassium: 3.8 mmol/L (ref 3.5–5.1)
Sodium: 121 mmol/L — ABNORMAL LOW (ref 135–145)

## 2021-01-15 LAB — CBC
HCT: 33.2 % — ABNORMAL LOW (ref 39.0–52.0)
Hemoglobin: 11.1 g/dL — ABNORMAL LOW (ref 13.0–17.0)
MCH: 28.3 pg (ref 26.0–34.0)
MCHC: 33.4 g/dL (ref 30.0–36.0)
MCV: 84.7 fL (ref 80.0–100.0)
Platelets: 286 10*3/uL (ref 150–400)
RBC: 3.92 MIL/uL — ABNORMAL LOW (ref 4.22–5.81)
RDW: 16.2 % — ABNORMAL HIGH (ref 11.5–15.5)
WBC: 4.9 10*3/uL (ref 4.0–10.5)
nRBC: 0 % (ref 0.0–0.2)

## 2021-01-15 LAB — BRAIN NATRIURETIC PEPTIDE: B Natriuretic Peptide: 1835.8 pg/mL — ABNORMAL HIGH (ref 0.0–100.0)

## 2021-01-15 MED ORDER — OXYCODONE-ACETAMINOPHEN 5-325 MG PO TABS
1.0000 | ORAL_TABLET | Freq: Once | ORAL | Status: AC
Start: 2021-01-15 — End: 2021-01-15
  Administered 2021-01-15: 1 via ORAL
  Filled 2021-01-15: qty 1

## 2021-01-15 MED ORDER — FUROSEMIDE 10 MG/ML IJ SOLN
80.0000 mg | Freq: Once | INTRAMUSCULAR | Status: AC
  Administered 2021-01-15: 80 mg via INTRAVENOUS
  Filled 2021-01-15: qty 8

## 2021-01-15 MED ORDER — MORPHINE SULFATE (PF) 4 MG/ML IV SOLN
4.0000 mg | Freq: Once | INTRAVENOUS | Status: AC
  Administered 2021-01-15: 4 mg via INTRAVENOUS
  Filled 2021-01-15: qty 1

## 2021-01-15 MED ORDER — FUROSEMIDE 10 MG/ML IJ SOLN
40.0000 mg | Freq: Once | INTRAMUSCULAR | Status: AC
  Administered 2021-01-15: 40 mg via INTRAVENOUS
  Filled 2021-01-15: qty 4

## 2021-01-15 NOTE — ED Notes (Signed)
Pt given sandwich and ice chips.

## 2021-01-15 NOTE — Discharge Instructions (Signed)
Increase your Lasix to 80 mg in the morning and 80 mg in the evening for the next week.  Follow-up with your cardiology doctor to make sure you are improving.  Call the office tomorrow to make sure they can try to get you set up for a follow-up evaluation.
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
## 2021-01-15 NOTE — ED Triage Notes (Signed)
Pt arrived via EMS, from home, states increasing SOB and leg swelling. States he has been taking lasix as prescribed. No recent changes. No sick contacts.

## 2021-01-15 NOTE — ED Notes (Signed)
Pt requesting more pain medication. MD aware.

## 2021-01-15 NOTE — ED Notes (Signed)
Per Tomi Bamberger: Give 40mg  IV lasix due to pt not voiding.

## 2021-01-16 ENCOUNTER — Other Ambulatory Visit: Payer: Self-pay | Admitting: Family Medicine

## 2021-01-16 ENCOUNTER — Telehealth: Payer: Self-pay | Admitting: *Deleted

## 2021-01-16 LAB — SARS CORONAVIRUS 2 (TAT 6-24 HRS): SARS Coronavirus 2: NEGATIVE

## 2021-01-16 MED ORDER — FUROSEMIDE 40 MG PO TABS: ORAL_TABLET | ORAL | 3 refills | Status: DC

## 2021-01-16 NOTE — Telephone Encounter (Signed)
Transition Care Management Unsuccessful Follow-up Telephone Call  Date of discharge and from where:  01/15/2021 - [facility]  Attempts:  1st Attempt  Reason for unsuccessful TCM follow-up call:  Left voice message

## 2021-01-17 ENCOUNTER — Other Ambulatory Visit: Payer: Self-pay | Admitting: Family Medicine

## 2021-01-17 DIAGNOSIS — M5412 Radiculopathy, cervical region: Secondary | ICD-10-CM

## 2021-01-17 DIAGNOSIS — E1140 Type 2 diabetes mellitus with diabetic neuropathy, unspecified: Secondary | ICD-10-CM

## 2021-01-17 NOTE — Telephone Encounter (Signed)
Requested Prescriptions  Pending Prescriptions Disp Refills  . DULoxetine (CYMBALTA) 60 MG capsule [Pharmacy Med Name: DULOXETINE HCL 60 MG ORAL CAPSULE DELAYED RELEASE PARTICLES] 30 capsule 0    Sig: TAKE 1 CAPSULE (60 MG TOTAL) BY MOUTH DAILY. (MORNING)     Psychiatry: Antidepressants - SNRI Passed - 01/17/2021  9:09 AM      Passed - Completed PHQ-2 or PHQ-9 in the last 360 days      Passed - Last BP in normal range    BP Readings from Last 1 Encounters:  01/15/21 133/86         Passed - Valid encounter within last 6 months    Recent Outpatient Visits          2 months ago Type 2 diabetes mellitus with other circulatory complication, with long-term current use of insulin ([place])   [place], [place], MD   3 months ago Type 2 diabetes mellitus with other circulatory complication, with long-term current use of insulin ([place])   [place], [employer], MD   5 months ago Type 2 diabetes mellitus with other circulatory complication, with long-term current use of insulin ([place])   [place], [place], MD   9 months ago Type 2 diabetes mellitus with other circulatory complication, with long-term current use of insulin ([place])   [place], [place], MD   10 months ago Type 2 diabetes mellitus with other circulatory complication, with long-term current use of insulin ([place])   [place], [place], MD             . gabapentin (NEURONTIN) 300 MG capsule [Pharmacy Med Name: GABAPENTIN 300 MG ORAL CAPSULE] 150 capsule 0    Sig: TAKE 2 CAPSULES IN THE MORNING AND 3 CAPSULES IN THE EVENING     Neurology: Anticonvulsants - gabapentin Passed - 01/17/2021  9:09 AM      Passed - Valid encounter within last 12 months    Recent Outpatient Visits          2 months ago Type 2 diabetes mellitus with other circulatory  complication, with long-term current use of insulin ([place])   [place], [place], MD   3 months ago Type 2 diabetes mellitus with other circulatory complication, with long-term current use of insulin ([place])   [place], [place], MD   5 months ago Type 2 diabetes mellitus with other circulatory complication, with long-term current use of insulin ([place])   [place], [place], MD   9 months ago Type 2 diabetes mellitus with other circulatory complication, with long-term current use of insulin ([place])   [place], [place], MD   10 months ago Type 2 diabetes mellitus with other circulatory complication, with long-term current use of insulin ([place])   [place], Charlane Ferretti, MD              Courtesy refill; needs 3 mo. F/u appt. In Feb.

## 2021-01-17 NOTE — Telephone Encounter (Signed)
Transition Care Management Unsuccessful Follow-up Telephone Call  Date of discharge and from where:  01/15/2021 [facility]  Attempts:  2nd Attempt  Reason for unsuccessful TCM follow-up call:  Unable to leave message

## 2021-01-18 NOTE — Telephone Encounter (Signed)
Transition Care Management Unsuccessful Follow-up Telephone Call  Date of discharge and from where:  01/15/2021 - [facility]  Attempts:  3rd Attempt  Reason for unsuccessful TCM follow-up call:  Left voice message

## 2021-01-21 ENCOUNTER — Telehealth (HOSPITAL_COMMUNITY): Payer: Self-pay

## 2021-01-21 NOTE — Telephone Encounter (Signed)
Left message for Lori to return my call. I will be awaiting returned phone call.

## 2021-01-24 ENCOUNTER — Ambulatory Visit: Payer: Medicaid Other | Admitting: Internal Medicine

## 2021-01-27 ENCOUNTER — Emergency Department (HOSPITAL_COMMUNITY): Payer: Medicaid Other

## 2021-01-27 ENCOUNTER — Inpatient Hospital Stay (HOSPITAL_COMMUNITY)
Admission: EM | Admit: 2021-01-27 | Discharge: 2021-02-03 | DRG: 291 | Disposition: A | Discharge status: Home / Self Care | Payer: Medicaid Other | Attending: Family Medicine | Admitting: Family Medicine

## 2021-01-27 ENCOUNTER — Other Ambulatory Visit: Payer: Self-pay

## 2021-01-27 ENCOUNTER — Encounter (HOSPITAL_COMMUNITY): Payer: Self-pay

## 2021-01-27 DIAGNOSIS — W06XXXA Fall from bed, initial encounter: Secondary | ICD-10-CM | POA: Diagnosis present

## 2021-01-27 DIAGNOSIS — Z888 Allergy status to other drugs, medicaments and biological substances status: Secondary | ICD-10-CM

## 2021-01-27 DIAGNOSIS — E1122 Type 2 diabetes mellitus with diabetic chronic kidney disease: Secondary | ICD-10-CM | POA: Diagnosis present

## 2021-01-27 DIAGNOSIS — Z8673 Personal history of transient ischemic attack (TIA), and cerebral infarction without residual deficits: Secondary | ICD-10-CM | POA: Diagnosis not present

## 2021-01-27 DIAGNOSIS — I5022 Chronic systolic (congestive) heart failure: Secondary | ICD-10-CM | POA: Diagnosis present

## 2021-01-27 DIAGNOSIS — S0083XA Contusion of other part of head, initial encounter: Secondary | ICD-10-CM | POA: Diagnosis present

## 2021-01-27 DIAGNOSIS — F1020 Alcohol dependence, uncomplicated: Secondary | ICD-10-CM | POA: Diagnosis present

## 2021-01-27 DIAGNOSIS — E876 Hypokalemia: Secondary | ICD-10-CM | POA: Diagnosis not present

## 2021-01-27 DIAGNOSIS — Z8546 Personal history of malignant neoplasm of prostate: Secondary | ICD-10-CM

## 2021-01-27 DIAGNOSIS — N183 Chronic kidney disease, stage 3 unspecified: Secondary | ICD-10-CM | POA: Diagnosis present

## 2021-01-27 DIAGNOSIS — E871 Hypo-osmolality and hyponatremia: Secondary | ICD-10-CM | POA: Diagnosis present

## 2021-01-27 DIAGNOSIS — E119 Type 2 diabetes mellitus without complications: Secondary | ICD-10-CM

## 2021-01-27 DIAGNOSIS — F1911 Other psychoactive substance abuse, in remission: Secondary | ICD-10-CM | POA: Diagnosis present

## 2021-01-27 DIAGNOSIS — Z7984 Long term (current) use of oral hypoglycemic drugs: Secondary | ICD-10-CM

## 2021-01-27 DIAGNOSIS — M6282 Rhabdomyolysis: Secondary | ICD-10-CM | POA: Diagnosis present

## 2021-01-27 DIAGNOSIS — I5043 Acute on chronic combined systolic (congestive) and diastolic (congestive) heart failure: Secondary | ICD-10-CM | POA: Diagnosis present

## 2021-01-27 DIAGNOSIS — R531 Weakness: Secondary | ICD-10-CM

## 2021-01-27 DIAGNOSIS — F17210 Nicotine dependence, cigarettes, uncomplicated: Secondary | ICD-10-CM | POA: Diagnosis present

## 2021-01-27 DIAGNOSIS — F1910 Other psychoactive substance abuse, uncomplicated: Secondary | ICD-10-CM | POA: Diagnosis present

## 2021-01-27 DIAGNOSIS — I480 Paroxysmal atrial fibrillation: Secondary | ICD-10-CM | POA: Diagnosis present

## 2021-01-27 DIAGNOSIS — N1831 Chronic kidney disease, stage 3a: Secondary | ICD-10-CM | POA: Diagnosis present

## 2021-01-27 DIAGNOSIS — W19XXXA Unspecified fall, initial encounter: Secondary | ICD-10-CM

## 2021-01-27 DIAGNOSIS — Z981 Arthrodesis status: Secondary | ICD-10-CM | POA: Diagnosis not present

## 2021-01-27 DIAGNOSIS — K7460 Unspecified cirrhosis of liver: Secondary | ICD-10-CM | POA: Diagnosis present

## 2021-01-27 DIAGNOSIS — I482 Chronic atrial fibrillation, unspecified: Secondary | ICD-10-CM | POA: Diagnosis present

## 2021-01-27 DIAGNOSIS — R296 Repeated falls: Secondary | ICD-10-CM | POA: Diagnosis present

## 2021-01-27 DIAGNOSIS — I5023 Acute on chronic systolic (congestive) heart failure: Secondary | ICD-10-CM | POA: Diagnosis present

## 2021-01-27 DIAGNOSIS — Z20822 Contact with and (suspected) exposure to covid-19: Secondary | ICD-10-CM | POA: Diagnosis present

## 2021-01-27 DIAGNOSIS — B182 Chronic viral hepatitis C: Secondary | ICD-10-CM | POA: Diagnosis present

## 2021-01-27 DIAGNOSIS — Z79899 Other long term (current) drug therapy: Secondary | ICD-10-CM | POA: Diagnosis not present

## 2021-01-27 DIAGNOSIS — N179 Acute kidney failure, unspecified: Secondary | ICD-10-CM | POA: Diagnosis not present

## 2021-01-27 DIAGNOSIS — I10 Essential (primary) hypertension: Secondary | ICD-10-CM | POA: Diagnosis present

## 2021-01-27 DIAGNOSIS — I130 Hypertensive heart and chronic kidney disease with heart failure and stage 1 through stage 4 chronic kidney disease, or unspecified chronic kidney disease: Secondary | ICD-10-CM | POA: Diagnosis not present

## 2021-01-27 LAB — TSH: TSH: 4.079 u[IU]/mL (ref 0.350–4.500)

## 2021-01-27 LAB — CBC WITH DIFFERENTIAL/PLATELET
Abs Immature Granulocytes: 0.03 10*3/uL (ref 0.00–0.07)
Basophils Absolute: 0 10*3/uL (ref 0.0–0.1)
Basophils Relative: 0 %
Eosinophils Absolute: 0 10*3/uL (ref 0.0–0.5)
Eosinophils Relative: 0 %
HCT: 33.5 % — ABNORMAL LOW (ref 39.0–52.0)
Hemoglobin: 11.4 g/dL — ABNORMAL LOW (ref 13.0–17.0)
Immature Granulocytes: 1 %
Lymphocytes Relative: 3 %
Lymphs Abs: 0.2 10*3/uL — ABNORMAL LOW (ref 0.7–4.0)
MCH: 27.9 pg (ref 26.0–34.0)
MCHC: 34 g/dL (ref 30.0–36.0)
MCV: 82.1 fL (ref 80.0–100.0)
Monocytes Absolute: 0.7 10*3/uL (ref 0.1–1.0)
Monocytes Relative: 13 %
Neutro Abs: 4.4 10*3/uL (ref 1.7–7.7)
Neutrophils Relative %: 83 %
Platelets: 236 10*3/uL (ref 150–400)
RBC: 4.08 MIL/uL — ABNORMAL LOW (ref 4.22–5.81)
RDW: 15.4 % (ref 11.5–15.5)
WBC: 5.3 10*3/uL (ref 4.0–10.5)
nRBC: 0 % (ref 0.0–0.2)

## 2021-01-27 LAB — COMPREHENSIVE METABOLIC PANEL
ALT: 40 U/L (ref 0–44)
AST: 71 U/L — ABNORMAL HIGH (ref 15–41)
Albumin: 2.9 g/dL — ABNORMAL LOW (ref 3.5–5.0)
Alkaline Phosphatase: 73 U/L (ref 38–126)
Anion gap: 12 (ref 5–15)
BUN: 13 mg/dL (ref 8–23)
CO2: 25 mmol/L (ref 22–32)
Calcium: 8.4 mg/dL — ABNORMAL LOW (ref 8.9–10.3)
Chloride: 79 mmol/L — ABNORMAL LOW (ref 98–111)
Creatinine, Ser: 1.07 mg/dL (ref 0.61–1.24)
GFR, Estimated: >60 mL/min (ref >60)
Glucose, Bld: 111 mg/dL — ABNORMAL HIGH (ref 70–99)
Potassium: 4.1 mmol/L (ref 3.5–5.1)
Sodium: 116 mmol/L — CL (ref 135–145)
Total Bilirubin: 1.6 mg/dL — ABNORMAL HIGH (ref 0.3–1.2)
Total Protein: 6.4 g/dL — ABNORMAL LOW (ref 6.5–8.1)

## 2021-01-27 LAB — RAPID URINE DRUG SCREEN, HOSP PERFORMED
Amphetamines: NOT DETECTED
Barbiturates: NOT DETECTED
Benzodiazepines: NOT DETECTED
Cocaine: NOT DETECTED
Opiates: POSITIVE — AB
Tetrahydrocannabinol: NOT DETECTED

## 2021-01-27 LAB — URINALYSIS, ROUTINE W REFLEX MICROSCOPIC
Bilirubin Urine: NEGATIVE
Glucose, UA: >=500 mg/dL — AB
Hgb urine dipstick: NEGATIVE
Ketones, ur: 5 mg/dL — AB
Leukocytes,Ua: NEGATIVE
Nitrite: NEGATIVE
Protein, ur: NEGATIVE mg/dL
Specific Gravity, Urine: 1.006 (ref 1.005–1.030)
pH: 5 (ref 5.0–8.0)

## 2021-01-27 LAB — ETHANOL: Alcohol, Ethyl (B): <10 mg/dL (ref <10)

## 2021-01-27 LAB — BRAIN NATRIURETIC PEPTIDE: B Natriuretic Peptide: 1177.1 pg/mL — ABNORMAL HIGH (ref 0.0–100.0)

## 2021-01-27 LAB — CK: Total CK: 405 U/L — ABNORMAL HIGH (ref 49–397)

## 2021-01-27 MED ORDER — ENOXAPARIN SODIUM 40 MG/0.4ML ~~LOC~~ SOLN
40.0000 mg | Freq: Every day | SUBCUTANEOUS | Status: DC
  Administered 2021-01-27 – 2021-02-02 (×7): 40 mg via SUBCUTANEOUS
  Filled 2021-01-27 (×7): qty 0.4

## 2021-01-27 MED ORDER — TRAMADOL HCL 50 MG PO TABS
50.0000 mg | ORAL_TABLET | Freq: Three times a day (TID) | ORAL | Status: DC | PRN
  Administered 2021-01-27 – 2021-01-31 (×6): 50 mg via ORAL
  Filled 2021-01-27 (×8): qty 1

## 2021-01-27 MED ORDER — SODIUM CHLORIDE 0.9 % IV BOLUS: 1000.0000 mL | Freq: Once | INTRAVENOUS | Status: DC

## 2021-01-27 MED ORDER — FUROSEMIDE 10 MG/ML IJ SOLN
40.0000 mg | Freq: Three times a day (TID) | INTRAMUSCULAR | Status: DC
  Administered 2021-01-27 – 2021-01-29 (×5): 40 mg via INTRAVENOUS
  Filled 2021-01-27 (×5): qty 4

## 2021-01-27 MED ORDER — ONDANSETRON HCL 4 MG PO TABS: 4.0000 mg | ORAL_TABLET | Freq: Four times a day (QID) | ORAL | Status: DC | PRN

## 2021-01-27 MED ORDER — ONDANSETRON HCL 4 MG/2ML IJ SOLN: 4.0000 mg | Freq: Four times a day (QID) | INTRAMUSCULAR | Status: DC | PRN

## 2021-01-27 MED ORDER — POLYETHYLENE GLYCOL 3350 17 G PO PACK: 17.0000 g | PACK | Freq: Every day | ORAL | Status: DC | PRN

## 2021-01-27 MED ORDER — SODIUM CHLORIDE 0.9 % IV BOLUS
500.0000 mL | Freq: Once | INTRAVENOUS | Status: AC
  Administered 2021-01-27: 500 mL via INTRAVENOUS

## 2021-01-27 MED ORDER — FUROSEMIDE 10 MG/ML IJ SOLN
40.0000 mg | Freq: Once | INTRAMUSCULAR | Status: AC
  Administered 2021-01-27: 40 mg via INTRAVENOUS
  Filled 2021-01-27: qty 4

## 2021-01-27 MED ORDER — MORPHINE SULFATE (PF) 4 MG/ML IV SOLN
4.0000 mg | Freq: Once | INTRAVENOUS | Status: AC
Start: 2021-01-27 — End: 2021-01-27
  Administered 2021-01-27: 4 mg via INTRAVENOUS
  Filled 2021-01-27: qty 1

## 2021-01-27 NOTE — ED Notes (Signed)
Patient transported to CT 

## 2021-01-27 NOTE — H&P (Signed)
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 initial radiographic and laboratory data in the context of their chronic comorbidities is felt to place them at high risk for further clinical deterioration. Furthermore, it is not anticipated that the patient will be medically stable for discharge from the hospital within 2 midnights of admission. The following factors support the patient status of inpatient.   " The patient's presenting symptoms include weakness " The worrisome physical exam findings include ecchymosis noted to right forehead with periorbital swelling, diffuse peripheral edema up to abdomen " The initial radiographic and laboratory data are worrisome because of vascular congestion on chest x-ray, elevated BNP, sodium level 116, elevated CK 405. " The chronic co-morbidities include chronic hyponatremia, alcohol/cocaine/tobacco/heroin abuse,  systolic congestive heart failure, neuropathy, atrial fibrillation, CKD stage IIIa, history of hepatitis C..   * I certify that at the point of admission it is my clinical judgment that the patient will require inpatient hospital care spanning beyond 2 midnights from the point of admission due to high intensity of service, high risk for further deterioration and high frequency of surveillance required.*    Brad [name] J [place] DO [facility] Available via Epic secure chat 7am-7pm After these hours, please refer to coverage provider listed on [email or web address] 01/27/2021, 5:45 PM

## 2021-01-27 NOTE — ED Triage Notes (Signed)
BIB ems from home. patient states he fell out of bed last night and been there until his roommate found him PTA. Bruising and swelling noted to right eye. Complaining of pain to face and head. Pt reports weakness. Speech is garbled but per room mate that is normal.

## 2021-01-27 NOTE — ED Provider Notes (Addendum)
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 at C4-5 and C5-6 eccentric towards the right with bilateral right foraminal narrowing, most severe at C5-6. Upper chest: Lung apices are clear. Other: Bilateral carotid artery atherosclerosis. IMPRESSION: 1. No acute intracranial pathology. 2. No acute osseous injury of the maxillofacial bones. 3. No acute osseous injury of the cervical spine. 4. Anterior cervical fusion from C4 through C6 with plate and screw fixation. Broad osseous ridging at C4-5 and C5-6 eccentric towards the right with bilateral right foraminal narrowing, most severe at C5-6. Electronically Signed   By: Kathreen Devoid   On: 01/27/2021 15:45    Procedures .Critical Care Performed by: Eustaquio Maize, PA-C Authorized by: Eustaquio Maize, PA-C  Critical care provider statement:    Critical care time (minutes):  45   Critical care was necessary to treat or prevent imminent or life-threatening deterioration of the following conditions:  Metabolic crisis   Critical care was time spent personally by me on the following activities:  Discussions with consultants,  evaluation of patient's response to treatment, examination of patient, ordering and performing treatments and interventions, ordering and review of laboratory studies, ordering and review of radiographic studies, pulse oximetry, re-evaluation of patient's condition, obtaining history from patient or surrogate and review of old charts     Medications Ordered in ED Medications  furosemide (LASIX) injection 40 mg (has no administration in time range)  morphine 4 MG/ML injection 4 mg (has no administration in time range)  sodium chloride 0.9 % bolus 500 mL (500 mLs Intravenous New Bag/Given 01/27/21 1708)    ED Course  I have reviewed the triage vital signs and the nursing notes.  Pertinent labs & imaging results that were available during my care of the patient were reviewed by me and considered in my medical decision making (see chart for details).  Clinical Course as of 01/27/21 1724  Sun Jan 27, 2021  1655 Sodium(!!): 116 [MV]    Clinical Course User Index [MV] Eustaquio Maize, [place]   MDM Rules/Calculators/A&P                          60 year old male presents to the ED today secondary to a fall that occurred last night, fell out of bed and laid on the ground for several hours until his roommate found him around noon today.  When EMS got there patient was noted to be generally weak which apparently has been going on for 3 to 4 days.  No focal neurological deficits appreciated with EMS.  Does appear to be volume overloaded with EMS however states that he has been compliant with his Lasix.  On arrival to the ED vitals are stable.  Patient is afebrile, nontachycardic and nontachypneic.  He appears to be in no acute distress.  He has obvious periorbital edema to the right eye however no signs of trauma to the eye itself including no hyphema.  He denies any blurry vision, double vision, foreign body sensation.  He has no focal neuro deficits on exam today.  He has equal decreased strength  bilaterally to upper and lower extremities.  Patient speech does appear somewhat slurred however roommate reported to EMS that this was baseline.  We will plan for lab work at this time for generalized weakness including CBC, CMP, UA, EKG, will add on BNP due to fluid overload, will add on EtOH and UDS as patient has history of same.  We will plan to scan head, neck, face. As well as xray L spine and CXR to rule out CHF exacerbation.   CBC without leukocytosis. Hgb stable at 11.4  Hemoglobin  Date Value Ref Range Status  01/27/2021 11.4 (L) 13.0 - 17.0 g/dL Final  01/15/2021 11.1 (L) 13.0 - 17.0 g/dL Final  12/07/2020 12.1 (L) 13.0 - 17.0 g/dL Final  11/26/2020 12.9 (L) 13.0 - 17.0 g/dL Final  01/29/2018 14.1 13.0 - 17.7 g/dL Final   CT Head, neck, and face are negative at this time CXR consistent with CHF DG L spine normal   CK 405 CMP with sodium 116; pt does appear to have chronic low hyponatremia however 10 days ago it was 121. He does have history  of alcohol abuse which I suspect is contributing however pt does appear fluid overloaded today. He will require admission for hyponatremia.   Sodium  Date Value Ref Range Status  01/27/2021 116 (LL) 135 - 145 mmol/L Final    Comment:    CRITICAL RESULT CALLED TO, READ BACK BY AND VERIFIED WITH: LEONARD,RN AT 1644 ON 01/27/2021 BY JPM   01/15/2021 121 (L) 135 - 145 mmol/L Final  12/07/2020 123 (L) 135 - 145 mmol/L Final  11/26/2020 130 (L) 135 - 145 mmol/L Final  10/01/2020 132 (L) 134 - 144 mmol/L Final  04/16/2020 123 (L) 134 - 144 mmol/L Final  01/16/2020 128 (L) 134 - 144 mmol/L Final  11/09/2019 134 134 - 144 mmol/L Final   EtOH negative UDS positive for opiates   BNP elevated 1,1771.1. Will provide IV lasix at this time  Discussed case with [facility] Dr. [place] who agrees to evaluate patient for admission.   This note was prepared using Dragon voice recognition software and may include unintentional dictation errors  due to the inherent limitations of voice recognition software.  Final Clinical Impression(s) / ED Diagnoses Final diagnoses:  Hyponatremia  Fall, initial encounter  Contusion of face, initial encounter  Generalized weakness    Rx / DC Orders ED Discharge Orders    None          Eustaquio Maize, PA-C 01/27/21 1724    Truddie Hidden, MD 01/27/21 1900

## 2021-01-28 ENCOUNTER — Other Ambulatory Visit: Payer: Self-pay

## 2021-01-28 ENCOUNTER — Telehealth: Payer: Self-pay

## 2021-01-28 LAB — SARS CORONAVIRUS 2 (TAT 6-24 HRS): SARS Coronavirus 2: NEGATIVE

## 2021-01-28 LAB — CBG MONITORING, ED
Glucose-Capillary: 110 mg/dL — ABNORMAL HIGH (ref 70–99)
Glucose-Capillary: 137 mg/dL — ABNORMAL HIGH (ref 70–99)
Glucose-Capillary: 132 mg/dL — ABNORMAL HIGH (ref 70–99)
Glucose-Capillary: 168 mg/dL — ABNORMAL HIGH (ref 70–99)

## 2021-01-28 LAB — COMPREHENSIVE METABOLIC PANEL
ALT: 39 U/L (ref 0–44)
AST: 67 U/L — ABNORMAL HIGH (ref 15–41)
Albumin: 2.6 g/dL — ABNORMAL LOW (ref 3.5–5.0)
Alkaline Phosphatase: 65 U/L (ref 38–126)
Anion gap: 10 (ref 5–15)
BUN: 14 mg/dL (ref 8–23)
CO2: 25 mmol/L (ref 22–32)
Calcium: 7.9 mg/dL — ABNORMAL LOW (ref 8.9–10.3)
Chloride: 83 mmol/L — ABNORMAL LOW (ref 98–111)
Creatinine, Ser: 1.15 mg/dL (ref 0.61–1.24)
GFR, Estimated: >60 mL/min (ref >60)
Glucose, Bld: 110 mg/dL — ABNORMAL HIGH (ref 70–99)
Potassium: 3.7 mmol/L (ref 3.5–5.1)
Sodium: 118 mmol/L — CL (ref 135–145)
Total Bilirubin: 1.3 mg/dL — ABNORMAL HIGH (ref 0.3–1.2)
Total Protein: 5.8 g/dL — ABNORMAL LOW (ref 6.5–8.1)

## 2021-01-28 LAB — GLUCOSE, CAPILLARY: Glucose-Capillary: 126 mg/dL — ABNORMAL HIGH (ref 70–99)

## 2021-01-28 LAB — PHOSPHORUS: Phosphorus: 2.7 mg/dL (ref 2.5–4.6)

## 2021-01-28 LAB — OSMOLALITY: Osmolality: 246 mOsm/kg — CL (ref 275–295)

## 2021-01-28 LAB — SODIUM
Sodium: 118 mmol/L — CL (ref 135–145)
Sodium: 118 mmol/L — CL (ref 135–145)
Sodium: 119 mmol/L — CL (ref 135–145)

## 2021-01-28 LAB — OSMOLALITY, URINE: Osmolality, Ur: 237 mOsm/kg — ABNORMAL LOW (ref 300–900)

## 2021-01-28 LAB — HEMOGLOBIN A1C
Hgb A1c MFr Bld: 6.8 % — ABNORMAL HIGH (ref 4.8–5.6)
Mean Plasma Glucose: 148.46 mg/dL

## 2021-01-28 LAB — CK: Total CK: 270 U/L (ref 49–397)

## 2021-01-28 LAB — SODIUM, URINE, RANDOM: Sodium, Ur: <10 mmol/L

## 2021-01-28 LAB — MAGNESIUM: Magnesium: 1.5 mg/dL — ABNORMAL LOW (ref 1.7–2.4)

## 2021-01-28 MED ORDER — LORAZEPAM 1 MG PO TABS: 1.0000 mg | ORAL_TABLET | ORAL | Status: AC | PRN

## 2021-01-28 MED ORDER — MAGNESIUM SULFATE 4 GM/100ML IV SOLN
4.0000 g | Freq: Once | INTRAVENOUS | Status: AC
  Administered 2021-01-28: 4 g via INTRAVENOUS
  Filled 2021-01-28: qty 100

## 2021-01-28 MED ORDER — MORPHINE SULFATE (PF) 4 MG/ML IV SOLN
4.0000 mg | Freq: Once | INTRAVENOUS | Status: AC
  Administered 2021-01-28: 4 mg via INTRAVENOUS
  Filled 2021-01-28: qty 1

## 2021-01-28 MED ORDER — FOLIC ACID 1 MG PO TABS
1.0000 mg | ORAL_TABLET | Freq: Every day | ORAL | Status: DC
  Administered 2021-01-28 – 2021-02-03 (×7): 1 mg via ORAL
  Filled 2021-01-28 (×7): qty 1

## 2021-01-28 MED ORDER — INSULIN ASPART 100 UNIT/ML ~~LOC~~ SOLN
0.0000 [IU] | Freq: Three times a day (TID) | SUBCUTANEOUS | Status: DC
  Administered 2021-01-28: 2 [IU] via SUBCUTANEOUS
  Administered 2021-01-28: 1 [IU] via SUBCUTANEOUS
  Administered 2021-01-29: 2 [IU] via SUBCUTANEOUS
  Administered 2021-01-29 – 2021-01-30 (×3): 1 [IU] via SUBCUTANEOUS
  Administered 2021-01-30: 3 [IU] via SUBCUTANEOUS
  Administered 2021-01-31: 1 [IU] via SUBCUTANEOUS
  Administered 2021-01-31: 2 [IU] via SUBCUTANEOUS
  Administered 2021-01-31 – 2021-02-02 (×6): 1 [IU] via SUBCUTANEOUS
  Administered 2021-02-02: 2 [IU] via SUBCUTANEOUS
  Administered 2021-02-03: 1 [IU] via SUBCUTANEOUS
  Administered 2021-02-03: 3 [IU] via SUBCUTANEOUS
  Filled 2021-01-28: qty 0.09

## 2021-01-28 MED ORDER — THIAMINE HCL 100 MG PO TABS
100.0000 mg | ORAL_TABLET | Freq: Every day | ORAL | Status: DC
  Administered 2021-01-28 – 2021-02-03 (×7): 100 mg via ORAL
  Filled 2021-01-28 (×7): qty 1

## 2021-01-28 MED ORDER — ALBUMIN HUMAN 25 % IV SOLN
25.0000 g | Freq: Once | INTRAVENOUS | Status: AC
  Administered 2021-01-28: 25 g via INTRAVENOUS
  Filled 2021-01-28: qty 100

## 2021-01-28 MED ORDER — ADULT MULTIVITAMIN W/MINERALS CH
1.0000 | ORAL_TABLET | Freq: Every day | ORAL | Status: DC
  Administered 2021-01-28 – 2021-02-03 (×7): 1 via ORAL
  Filled 2021-01-28 (×7): qty 1

## 2021-01-28 MED ORDER — INSULIN ASPART 100 UNIT/ML ~~LOC~~ SOLN
0.0000 [IU] | Freq: Every day | SUBCUTANEOUS | Status: DC
  Filled 2021-01-28: qty 0.05

## 2021-01-28 MED ORDER — LORAZEPAM 2 MG/ML IJ SOLN: 1.0000 mg | INTRAMUSCULAR | Status: AC | PRN

## 2021-01-28 MED ORDER — METOLAZONE 2.5 MG PO TABS
2.5000 mg | ORAL_TABLET | Freq: Once | ORAL | Status: AC
  Administered 2021-01-28: 2.5 mg via ORAL
  Filled 2021-01-28: qty 1

## 2021-01-28 MED ORDER — THIAMINE HCL 100 MG/ML IJ SOLN
100.0000 mg | Freq: Every day | INTRAMUSCULAR | Status: DC
  Filled 2021-01-28 (×2): qty 2

## 2021-01-28 NOTE — Progress Notes (Signed)
CRITICAL VALUE ALERT  Critical Value:  Sodium  Date & Time Notied:  01/28/2021 2237  Provider Notified: Jonny Ruiz NP  Orders Received/Actions taken: Waiting for response

## 2021-01-28 NOTE — ED Notes (Signed)
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 also generated. COMPARISON:  11/26/2020 FINDINGS: CT HEAD FINDINGS Brain: No evidence of acute infarction, hemorrhage, extra-axial collection, ventriculomegaly, or mass effect. Generalized cerebral atrophy. Periventricular white matter low attenuation likely secondary to microangiopathy. Vascular: Cerebrovascular atherosclerotic calcifications are noted. Skull: Negative for fracture or focal lesion. Sinuses/Orbits:  Visualized portions of the orbits are unremarkable. Visualized portions of the paranasal sinuses are unremarkable. Visualized portions of the mastoid air cells are unremarkable. Other: None. CT MAXILLOFACIAL FINDINGS Osseous: No fracture or mandibular dislocation. No destructive process. Osteoarthritis of bilateral temporomandibular joints. Orbits: Negative. No traumatic or inflammatory finding. Sinuses: Mild mucosal thickening of bilateral maxillary sinuses. Periapical lucency around the left upper central incisor and right upper premolar as can be seen with dental disease. Soft tissues: Severe soft tissue swelling around the right orbit and cheek. CT CERVICAL SPINE FINDINGS Alignment: Normal. Skull base and vertebrae: No acute fracture. No primary bone lesion or focal pathologic process. Soft tissues and spinal canal: No prevertebral fluid or swelling. No visible canal hematoma. Disc levels: Anterior cervical fusion from C4 through C6 with plate and screw fixation. Broad osseous ridging at C4-5 and C5-6 eccentric towards the right with bilateral right foraminal narrowing, most severe at C5-6. Upper chest: Lung apices are clear. Other: Bilateral carotid artery atherosclerosis. IMPRESSION: 1. No acute intracranial pathology. 2. No acute osseous injury of the maxillofacial bones. 3. No acute osseous injury of the cervical spine. 4. Anterior cervical fusion from C4 through C6 with plate and  screw fixation. Broad osseous ridging at C4-5 and C5-6 eccentric towards the right with bilateral right foraminal narrowing, most severe at C5-6. Electronically Signed   By: Kathreen Devoid   On: 01/27/2021 15:45    Pending Labs Unresulted Labs (From admission, onward)          Start     Ordered   02/03/21 0500  Creatinine, serum  (enoxaparin (LOVENOX)    CrCl >/= 30 ml/min)  Weekly,   R     Comments: while on enoxaparin therapy    01/27/21 1736   01/28/21 0500  Comprehensive metabolic panel  Daily,   R      01/27/21 1736    01/27/21 2200  Sodium  Now then every 8 hours,   R (with STAT occurrences)      01/27/21 1736          Vitals/Pain Today's Vitals   01/28/21 1630 01/28/21 1800 01/28/21 1815 01/28/21 1830  BP: 127/71 (!) 148/85  (!) 148/85  Pulse: (!) 43   66  Resp: 14 16  17   Temp:      TempSrc:      SpO2: 96%  94% 97%  Weight:      Height:      PainSc:        Isolation Precautions No active isolations  Medications Medications  enoxaparin (LOVENOX) injection 40 mg (40 mg Subcutaneous Given 01/27/21 2219)  furosemide (LASIX) injection 40 mg (40 mg Intravenous Given 01/28/21 1345)  traMADol (ULTRAM) tablet 50 mg (50 mg Oral Given 01/28/21 0806)  polyethylene glycol (MIRALAX / GLYCOLAX) packet 17 g (has no administration in time range)  ondansetron (ZOFRAN) tablet 4 mg (has no administration in time range)    Or  ondansetron (ZOFRAN) injection 4 mg (has no administration in time range)  LORazepam (ATIVAN) tablet 1-4 mg (has no administration in time range)    Or  LORazepam (ATIVAN) injection 1-4 mg (has no administration in time range)  thiamine tablet 100 mg (100 mg Oral Given 01/28/21 0806)    Or  thiamine (B-1) injection 100 mg ( Intravenous See Alternative 07/22/18 1478)  folic acid (FOLVITE) tablet 1 mg (1 mg Oral Given 01/28/21 0806)  multivitamin with minerals tablet 1 tablet (1 tablet Oral Given 01/28/21 0804)  insulin aspart (novoLOG) injection 0-9 Units (2 Units Subcutaneous Given 01/28/21 1801)  insulin aspart (novoLOG) injection 0-5 Units (has no administration in time range)  sodium chloride 0.9 % bolus 500 mL (0 mLs Intravenous Stopped 01/27/21 1740)  furosemide (LASIX) injection 40 mg (40 mg Intravenous Given 01/27/21 1736)  morphine 4 MG/ML injection 4 mg (4 mg Intravenous Given 01/27/21 1737)  albumin human 25 % solution 25 g (0 g Intravenous Stopped 01/28/21 0625)  morphine 4 MG/ML injection 4 mg (4 mg Intravenous Given 01/28/21 0536)  magnesium sulfate IVPB 4 g 100 mL ( Intravenous Stopped  01/28/21 1330)  metolazone (ZAROXOLYN) tablet 2.5 mg (2.5 mg Oral Given 01/28/21 1802)    Mobility walks with person assist

## 2021-01-28 NOTE — ED Notes (Signed)
Date and time results received: 01/28/21 2:40 PM  (use smartphrase ".now" to insert current time)  Test: Sodium Critical Value: 119  Name of Provider Notified: [place]   Orders Received? Or Actions Taken?: See orderes

## 2021-01-28 NOTE — ED Notes (Signed)
LUNCH TRAY GIVEN. 

## 2021-01-28 NOTE — Telephone Encounter (Signed)
Letter prepared and to Dr. Henrene Pastor for approval.

## 2021-01-28 NOTE — Evaluation (Signed)
Physical Therapy Evaluation Patient Details Name: Brad [name] MRN: [id number] DOB: 02-Sep-1961 Today's Date: 01/28/2021   History of Present Illness  60 y.o. male with medical history significant of chronic hyponatremia, chronic systolic congestive heart failure, neuropathy, paroxysmal atrial fibrillation, EtOH abuse, history of cocaine and tobacco abuse, CKD stage IIIa, hepatitis C, cervical spinal stenosis who presented to the ED via EMS after being found on the floor by his roommate this afternoon.  Patient apparently fell out of bed last night and had been on the floor since being found by roommate today.  Patient states has been feeling weak over the last 3 to 4 days.  Also reports his generalized edema has worsened and his home Lasix has been ineffective.  Pt admitted for acute on chronic hyponatremia and acute on chronic systolic CHF  Clinical Impression  Pt admitted with above diagnosis.  Pt currently with functional limitations due to the deficits listed below (see PT Problem List). Pt will benefit from skilled PT to increase their independence and safety with mobility to allow discharge to the venue listed below.  Pt familiar to this PT from multiple admission.  Pt typically progresses well and discharges home.  Pt states his roommate sold his RW and currently using RW for mobility at this time.  Pt will likely progress to no further PT needs upon d/c and would benefit from nursing ambulating with pt during acute stay.  SpO2 98-100% on room air during session.     Follow Up Recommendations No PT follow up    Equipment Recommendations  Rolling walker with 5" wheels    Recommendations for Other Services       Precautions / Restrictions Precautions Precautions: Fall      Mobility  Bed Mobility Overal bed mobility: Needs Assistance Bed Mobility: Supine to Sit;Sit to Supine     Supine to sit: Min guard Sit to supine: Min guard   General bed mobility comments: min/guard for  lines    Transfers Overall transfer level: Needs assistance Equipment used: Rolling walker (2 wheeled) Transfers: Sit to/from Stand Sit to Stand: Min assist         General transfer comment: slight assist for steadying  Ambulation/Gait Ambulation/Gait assistance: Min guard;Min assist Gait Distance (Feet): 200 Feet Assistive device: Rolling walker (2 wheeled) Gait Pattern/deviations: Step-through pattern;Decreased stride length     General Gait Details: assist for negotiating RW at times, pt tends to drift right or left (reports poor vision, did get glasses however does not have them here)  Stairs            Wheelchair Mobility    Modified [name] (Stroke Patients Only)       Balance Overall balance assessment: History of Falls                                           Pertinent Vitals/Pain Pain Assessment: Faces Faces Pain Scale: Hurts a little bit Pain Location: generalized (from falls) Pain Descriptors / Indicators: Tender Pain Intervention(s): Monitored during session;Repositioned    Home Living Family/patient expects to be discharged to:: Private residence Living Arrangements: Other (Comment) (roommate) Available Help at Discharge: Friend(s);Available PRN/intermittently Type of Home: Apartment Home Access: Level entry     Home Layout: One level Home Equipment: Cane - single point Additional Comments: pt reports his roommate sold his walker because he wasn't using it  Prior Function Level of Independence: Independent         Comments: 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        Lower Extremity Assessment Lower Extremity Assessment: Generalized weakness    Cervical / Trunk Assessment Cervical / Trunk Assessment: Normal  Communication   Communication: No difficulties  Cognition Arousal/Alertness: Awake/alert Behavior During Therapy: WFL for tasks  assessed/performed Overall Cognitive Status: Within Functional Limits for tasks assessed                                        General Comments      Exercises     Assessment/Plan    PT Assessment Patient needs continued PT services  PT Problem List Decreased strength;Decreased mobility;Decreased activity tolerance;Decreased balance;Decreased knowledge of use of DME       PT Treatment Interventions Gait training;DME instruction;Therapeutic exercise;Functional mobility training;Therapeutic activities;Patient/family education;Balance training    PT Goals (Current goals can be found in the Care Plan section)  Acute Rehab PT Goals PT Goal Formulation: With patient Time For Goal Achievement: 02/11/21 Potential to Achieve Goals: Good    Frequency Min 3X/week   Barriers to discharge        Co-evaluation               AM-PAC PT "6 Clicks" Mobility  Outcome Measure Help needed turning from your back to your side while in a flat bed without using bedrails?: A Little Help needed moving from lying on your back to sitting on the side of a flat bed without using bedrails?: A Little Help needed moving to and from a bed to a chair (including a wheelchair)?: A Little Help needed standing up from a chair using your arms (e.g., wheelchair or bedside chair)?: A Little Help needed to walk in hospital room?: A Little Help needed climbing 3-5 steps with a railing? : A Little 6 Click Score: 18    End of Session   Activity Tolerance: Patient tolerated treatment well Patient left: in bed;with call bell/phone within reach;with bed alarm set Nurse Communication: Mobility status (observed from nurses station) PT Visit Diagnosis: Difficulty in walking, not elsewhere classified (R26.2);History of falling (Z91.81)    Time: 9163-8466 PT Time Calculation (min) (ACUTE ONLY): 25 min   Charges:   PT Evaluation $PT Eval Low Complexity: 1 Low PT Treatments $Gait Training:  8-22 mins       Jannette Spanner PT, DPT Acute Rehabilitation Services Pager: [phone] Office: [phone]    York Ram E 01/28/2021, 4:02 PM

## 2021-01-28 NOTE — Telephone Encounter (Signed)
-----   Message from Irene Shipper, MD sent at 01/24/2021  3:25 PM EST ----- Regarding: RE: Discharge from practice All, I think that a GI physician needs to make the decision.  Beyond that, the letter should be signed "[facility]", less that particular GI physician specifies otherwise.  I think that it is unwise to attach a physician name to a letter that a particular patient may deem unfair or hostile.  This may lead to targeting of a particular physician.  Often the patient has not seen any particular physician but has no showed on multiple appointments for multiple providers. If this is "policy", then show me the policy as it should change.  Thanks Dr. Henrene Pastor ----- Message ----- From: Algernon Huxley, RN Sent: 01/24/2021   3:18 PM EST To: Irene Shipper, MD Subject: FW: Discharge from practice                    Please see note below and advise.  ----- Message ----- From: Marlon Pel, RN Sent: 01/24/2021   1:23 PM EST To: Algernon Huxley, RN Subject: RE: Discharge from practice                    The practice can't discharge a patient only a physician.  ----- Message ----- From: Algernon Huxley, RN Sent: 01/24/2021  11:45 AM EST To: Marlon Pel, RN Subject: FW: Discharge from practice                    Sheri,  Please see the note below about the signing of the letter. Can I do this?  Thanks, Vaughan Basta ----- Message ----- From: Irene Shipper, MD Sent: 01/24/2021  11:40 AM EST To: Algernon Huxley, RN Subject: Discharge from practice                        Vaughan Basta, This patient has had multiple cancellations and no-shows over the years with multiple providers.  Please discharge from the practice for that reason.  The discharge letter should be signed "[facility]" (not Dr. Henrene Pastor). Thank you, JP

## 2021-01-28 NOTE — Progress Notes (Signed)
PROGRESS NOTE    Brad [name]  OVF:[id number] DOB: 1961/09/23 DOA: 01/27/2021 PCP: Charlott Rakes, MD    Brief Narrative:  Brad [name] is a 60 y.o. male with medical history significant of chronic hyponatremia, chronic systolic congestive heart failure, neuropathy, paroxysmal atrial fibrillation, EtOH abuse, history of cocaine and tobacco abuse, CKD stage IIIa, hepatitis C, cervical spinal stenosis who presented to the ED via EMS after being found on the floor by his roommate this afternoon.  Patient apparently fell out of bed last night and had been on the floor since being found by roommate today.  Patient states has been feeling weak over the last 3 to 4 days.  Also reports his generalized edema has worsened and his home Lasix has been ineffective.  Patient does not recall events entirely but denies loss of consciousness.  No recent changes in his home medication regimen.  No sick contacts.  Reports he is unvaccinated against Covid-19.  Also endorses headache where he fell and hit his head, denies visual changes, no chest pain, no palpitations, no fever/chills/night sweats, no nausea/vomiting/diarrhea, no abdominal pain, no paresthesias.  In the ED, Temperature 97.6, HR 72, RR 12, BP 161/73, SPO2 100% on room air.  WBC 5.3, hemoglobin 11.4, platelets 236.  Sodium 116 (121 on 01/15/21), potassium 4.1, chloride 79, CO2 25, BUN 13, creatinine 1.07, glucose 111.  AST 71, ALT 40.  BNP 1177.  CK 405.  Urinalysis with greater than 500 glucose, 5 ketones; otherwise unrevealing.  EtOH level less than 10.  UDS positive for opiates.  CT head with no acute intracranial abnormality.  CT C-spine with no acute osseous injury, but does note severe foraminal narrowing C5-C6 which is chronic.  Chest x-ray with vascular congestion.  L-spine x-ray with no acute osseous abnormality.  Patient was given Lasix 40 mg IV x1.  Hospital service consulted for further evaluation and management of severe hyponatremia with  volume overload secondary to acute on chronic systolic congestive heart failure exacerbation and mild rhabdomyolysis.   Assessment & Plan:   Principal Problem:   Hyponatremia Active Problems:   History of drug abuse (HCC)   Polysubstance abuse (HCC)   Chronic hepatitis C with cirrhosis ([place])   Essential hypertension   DM type 2 (diabetes mellitus, type 2) (HCC)   CKD (chronic kidney disease) stage 3, GFR 30-59 ml/min (HCC)   Acute on chronic combined systolic and diastolic CHF (congestive heart failure) (HCC)   Chronic atrial fibrillation   Alcohol dependence syndrome (HCC)   Chronic systolic CHF (congestive heart failure) (HCC)   Acute on chronic hyponatremia Sodium level 116 on arrival, has history of chronic hyponatremia with sodium level in the 120s-130s.  Most recently sodium 121 on 01/15/2021.  Etiology likely multifactorial with severe volume overload with poorly controlled CHF combined with continued EtOH abuse.  Urine osmolality 237, urine sodium less than 10. --Na 116>118 --Furosemide 40 mg IV every 8 hours --sodium level q8h; goal correction no more than 8-12 meq/L in a 24h period. --Monitor on telemetry  Acute on chronic systolic congestive heart failure Anasarca with diffuse peripheral/body wall edema TTE 08/01/2020 with LVEF 30-35%, LV moderately decreased function with global hypokinesis, LV mildly dilated, mild LVH, RV systolic function mildly reduced, LA/RA moderately dilated, IVC dilated in size.  Patient reports his home furosemide has been less effective over the past few weeks; and reports increasing swelling/weight gain. --Furosemide 40 mg IV every 8 hours --Strict I's and O's and daily weights --Closely monitor BMP daily  Mild rhabdomyolysis Patient has been on the floor since last evening following fall.  Elevated CK level of 405.  Given patient's severe peripheral edema, will avoid any further IV fluids given his CHF history. --CK 405>270 --Supportive  care, pain control --Repeat CK level in a.m.  Transaminitis AST 71, ALT 40.  History of chronic hepatitis C, also with continued alcohol abuse, and likely hepatic congestion from volume overload. --Hold home statin --Follow CMP daily  Type 2 diabetes mellitus Hemoglobin A1c 6.6.  Farxiga 10 mg p.o. daily outpatient. --SSI for coverage --CBGs before every meal/at bedtime  Chronic atrial fibrillation Continues in atrial fibrillation on telemetry.  Not on chronic anticoagulation due to fall history and continued alcohol abuse. --Continue to monitor on telemetry  CKD stage IIIa Creatinine 1.07 on admission, at baseline. --Continue monitor BMP daily with aggressive IV diuresis as above  Polysubstance abuse with EtOH, cocaine, heroin, tobacco UDS positive for opiates.  EtOH level less than 10. Discussed with patient need for complete cessation of tobacco, alcohol and illicit drugs --TOC consult for substance abuse --CIWAA protocol with symptom triggered Ativan --Multivitamin, thiamine, folic acid  Chronic hepatitis C Treatment nave.  AST 71, ALT 40; consistent with continued alcohol abuse. --Outpatient follow-up with infectious disease  Recurrent falls secondary to EtOH abuse --PT/OT evaluation: pending  Cervical spine stenosis s/p ACDF 07/24/2020 No focal neurological findings on physical exam. --Outpatient follow-up    DVT prophylaxis: Lovenox   Code Status: Full Code Family Communication: Updated patient extensively at bedside  Disposition Plan:  Level of care: Telemetry Status is: Inpatient  Remains inpatient appropriate because:Ongoing diagnostic testing needed not appropriate for outpatient work up, Unsafe d/c plan, IV treatments appropriate due to intensity of illness or inability to take PO and Inpatient level of care appropriate due to severity of illness   Dispo: The patient is from: Home              Anticipated d/c is to: Home              Anticipated  d/c date is: 3 days              Patient currently is not medically stable to d/c.   Difficult to place patient No   Consultants:   None  Procedures:   none  Antimicrobials:   none   Subjective: Patient seen and examined at bedside, resting comfortably.  Continues in ED holding area.  Requesting pain medication.  Continues with significant lower extremity edema.  No other specific complaints this morning.  Nursing staff present at bedside.  Denies headache, no visual changes, no chest pain, no palpitations, no shortness of breath, no abdominal pain.  No acute events overnight per nursing staff.  Objective: Vitals:   01/28/21 0900 01/28/21 0930 01/28/21 1000 01/28/21 1130  BP: 120/86 (!) 109/46 120/80 138/79  Pulse: 70 86 65 70  Resp: 15 16 16 20   Temp:      TempSrc:      SpO2: 98% 95% 99% 99%  Weight:      Height:        Intake/Output Summary (Last 24 hours) at 01/28/2021 1230 Last data filed at 01/27/2021 1740 Gross per 24 hour  Intake 500 ml  Output --  Net 500 ml   Filed Weights   01/27/21 1447  Weight: 79 kg    Examination:  General exam: Appears calm and comfortable  Respiratory system: Clear to auscultation. Respiratory effort normal. Cardiovascular system: S1 & S2  heard, RRR. No JVD, murmurs, rubs, gallops or clicks. No pedal edema. Gastrointestinal system: Abdomen is nondistended, soft and nontender. No organomegaly or masses felt. Normal bowel sounds heard. Central nervous system: Alert and oriented. No focal neurological deficits. Extremities: Symmetric 5 x 5 power. Skin: No rashes, lesions or ulcers Psychiatry: Judgement and insight appear normal. Mood & affect appropriate.     Data Reviewed: I have personally reviewed following labs and imaging studies  CBC: Recent Labs  Lab 01/27/21 1554  WBC 5.3  NEUTROABS 4.4  HGB 11.4*  HCT 33.5*  MCV 82.1  PLT 093   Basic Metabolic Panel: Recent Labs  Lab 01/27/21 1554 01/27/21 2352  01/28/21 0511  NA 116* 118* 118*  K 4.1  --  3.7  CL 79*  --  83*  CO2 25  --  25  GLUCOSE 111*  --  110*  BUN 13  --  14  CREATININE 1.07  --  1.15  CALCIUM 8.4*  --  7.9*  MG  --   --  1.5*  PHOS  --   --  2.7   GFR: Estimated Creatinine Clearance: 71.8 mL/min (by C-G formula based on SCr of 1.15 mg/dL). Liver Function Tests: Recent Labs  Lab 01/27/21 1554 01/28/21 0511  AST 71* 67*  ALT 40 39  ALKPHOS 73 65  BILITOT 1.6* 1.3*  PROT 6.4* 5.8*  ALBUMIN 2.9* 2.6*   No results for input(s): LIPASE, AMYLASE in the last 168 hours. No results for input(s): AMMONIA in the last 168 hours. Coagulation Profile: No results for input(s): INR, PROTIME in the last 168 hours. Cardiac Enzymes: Recent Labs  Lab 01/27/21 1554 01/28/21 0511  CKTOTAL 405* 270   BNP (last 3 results) No results for input(s): PROBNP in the last 8760 hours. HbA1C: Recent Labs    01/28/21 0511  HGBA1C 6.8*   CBG: Recent Labs  Lab 01/28/21 0748 01/28/21 1141  GLUCAP 110* 137*   Lipid Profile: No results for input(s): CHOL, HDL, LDLCALC, TRIG, CHOLHDL, LDLDIRECT in the last 72 hours. Thyroid Function Tests: Recent Labs    01/27/21 1554  TSH 4.079   Anemia Panel: No results for input(s): VITAMINB12, FOLATE, FERRITIN, TIBC, IRON, RETICCTPCT in the last 72 hours. Sepsis Labs: No results for input(s): PROCALCITON, LATICACIDVEN in the last 168 hours.  Recent Results (from the past 240 hour(s))  SARS CORONAVIRUS 2 (TAT 6-24 HRS) Nasopharyngeal Nasopharyngeal Swab     Status: None   Collection Time: 01/27/21  5:09 PM   Specimen: Nasopharyngeal Swab  Result Value Ref Range Status   SARS Coronavirus 2 NEGATIVE NEGATIVE Final    Comment: (NOTE) SARS-CoV-2 target nucleic acids are NOT DETECTED.  The SARS-CoV-2 RNA is generally detectable in upper and lower respiratory specimens during the acute phase of infection. Negative results do not preclude SARS-CoV-2 infection, do not rule  out co-infections with other pathogens, and should not be used as the sole basis for treatment or other patient management decisions. Negative results must be combined with clinical observations, patient history, and epidemiological information. The expected result is Negative.  Fact Sheet for Patients: SugarRoll.be  Fact Sheet for Healthcare Providers: https://www.woods-mathews.com/  This test is not yet approved or cleared by the [place] FDA and  has been authorized for detection and/or diagnosis of SARS-CoV-2 by FDA under an Emergency Use Authorization (EUA). This EUA will remain  in effect (meaning this test can be used) for the duration of the COVID-19 declaration under Se ction 564(b)(1)  of the Act, 21 U.S.C. section 360bbb-3(b)(1), unless the authorization is terminated or revoked sooner.  Performed at [facility], [place] [address], [place], [place] [address]          Radiology Studies: DG Lumbar Spine 2-3 Views  Result Date: 01/27/2021 CLINICAL DATA:  Pain after fall EXAM: LUMBAR SPINE - 2-3 VIEW COMPARISON:  10/02/2017, levin 1721 FINDINGS: Frontal and lateral views of the lumbar spine demonstrate 5 non-rib-bearing lumbar type vertebral bodies in stable anatomic alignment. No acute displaced fracture. Disc spaces are well preserved. Stable mild facet hypertrophy at the lumbosacral junction. Diffuse atherosclerosis unchanged. IMPRESSION: 1. No acute lumbar spine fracture. Electronically Signed   By: Randa Ngo M.D.   On: 01/27/2021 15:53   CT Head Wo Contrast  Result Date: 01/27/2021 CLINICAL DATA:  Status post fall. Laceration above the eye. Five by his roommate. EXAM: CT HEAD WITHOUT CONTRAST CT MAXILLOFACIAL WITHOUT CONTRAST CT CERVICAL SPINE WITHOUT CONTRAST TECHNIQUE: Multidetector CT imaging of the head, cervical spine, and maxillofacial structures were performed using the standard protocol without intravenous  contrast. Multiplanar CT image reconstructions of the cervical spine and maxillofacial structures were also generated. COMPARISON:  11/26/2020 FINDINGS: CT HEAD FINDINGS Brain: No evidence of acute infarction, hemorrhage, extra-axial collection, ventriculomegaly, or mass effect. Generalized cerebral atrophy. Periventricular white matter low attenuation likely secondary to microangiopathy. Vascular: Cerebrovascular atherosclerotic calcifications are noted. Skull: Negative for fracture or focal lesion. Sinuses/Orbits: Visualized portions of the orbits are unremarkable. Visualized portions of the paranasal sinuses are unremarkable. Visualized portions of the mastoid air cells are unremarkable. Other: None. CT MAXILLOFACIAL FINDINGS Osseous: No fracture or mandibular dislocation. No destructive process. Osteoarthritis of bilateral temporomandibular joints. Orbits: Negative. No traumatic or inflammatory finding. Sinuses: Mild mucosal thickening of bilateral maxillary sinuses. Periapical lucency around the left upper central incisor and right upper premolar as can be seen with dental disease. Soft tissues: Severe soft tissue swelling around the right orbit and cheek. CT CERVICAL SPINE FINDINGS Alignment: Normal. Skull base and vertebrae: No acute fracture. No primary bone lesion or focal pathologic process. Soft tissues and spinal canal: No prevertebral fluid or swelling. No visible canal hematoma. Disc levels: Anterior cervical fusion from C4 through C6 with plate and screw fixation. Broad osseous ridging at C4-5 and C5-6 eccentric towards the right with bilateral right foraminal narrowing, most severe at C5-6. Upper chest: Lung apices are clear. Other: Bilateral carotid artery atherosclerosis. IMPRESSION: 1. No acute intracranial pathology. 2. No acute osseous injury of the maxillofacial bones. 3. No acute osseous injury of the cervical spine. 4. Anterior cervical fusion from C4 through C6 with plate and screw fixation.  Broad osseous ridging at C4-5 and C5-6 eccentric towards the right with bilateral right foraminal narrowing, most severe at C5-6. Electronically Signed   By: Kathreen Devoid   On: 01/27/2021 15:45   CT Cervical Spine Wo Contrast  Result Date: 01/27/2021 CLINICAL DATA:  Status post fall. Laceration above the eye. Five by his roommate. EXAM: CT HEAD WITHOUT CONTRAST CT MAXILLOFACIAL WITHOUT CONTRAST CT CERVICAL SPINE WITHOUT CONTRAST TECHNIQUE: Multidetector CT imaging of the head, cervical spine, and maxillofacial structures were performed using the standard protocol without intravenous contrast. Multiplanar CT image reconstructions of the cervical spine and maxillofacial structures were also generated. COMPARISON:  11/26/2020 FINDINGS: CT HEAD FINDINGS Brain: No evidence of acute infarction, hemorrhage, extra-axial collection, ventriculomegaly, or mass effect. Generalized cerebral atrophy. Periventricular white matter low attenuation likely secondary to microangiopathy. Vascular: Cerebrovascular atherosclerotic calcifications are noted. Skull:  Negative for fracture or focal lesion. Sinuses/Orbits: Visualized portions of the orbits are unremarkable. Visualized portions of the paranasal sinuses are unremarkable. Visualized portions of the mastoid air cells are unremarkable. Other: None. CT MAXILLOFACIAL FINDINGS Osseous: No fracture or mandibular dislocation. No destructive process. Osteoarthritis of bilateral temporomandibular joints. Orbits: Negative. No traumatic or inflammatory finding. Sinuses: Mild mucosal thickening of bilateral maxillary sinuses. Periapical lucency around the left upper central incisor and right upper premolar as can be seen with dental disease. Soft tissues: Severe soft tissue swelling around the right orbit and cheek. CT CERVICAL SPINE FINDINGS Alignment: Normal. Skull base and vertebrae: No acute fracture. No primary bone lesion or focal pathologic process. Soft tissues and spinal canal: No  prevertebral fluid or swelling. No visible canal hematoma. Disc levels: Anterior cervical fusion from C4 through C6 with plate and screw fixation. Broad osseous ridging at C4-5 and C5-6 eccentric towards the right with bilateral right foraminal narrowing, most severe at C5-6. Upper chest: Lung apices are clear. Other: Bilateral carotid artery atherosclerosis. IMPRESSION: 1. No acute intracranial pathology. 2. No acute osseous injury of the maxillofacial bones. 3. No acute osseous injury of the cervical spine. 4. Anterior cervical fusion from C4 through C6 with plate and screw fixation. Broad osseous ridging at C4-5 and C5-6 eccentric towards the right with bilateral right foraminal narrowing, most severe at C5-6. Electronically Signed   By: Kathreen Devoid   On: 01/27/2021 15:45   DG Chest Port 1 View  Result Date: 01/27/2021 CLINICAL DATA:  Weakness, fell EXAM: PORTABLE CHEST 1 VIEW COMPARISON:  01/15/2021 FINDINGS: Single frontal view of the chest demonstrates stable enlargement of the cardiac silhouette. There is central vascular congestion, with mild perihilar ground-glass airspace disease consistent with mild congestive heart failure. No effusion or pneumothorax. No acute fracture. IMPRESSION: 1. Mild congestive heart failure. Electronically Signed   By: Randa Ngo M.D.   On: 01/27/2021 15:54   CT Maxillofacial Wo Contrast  Result Date: 01/27/2021 CLINICAL DATA:  Status post fall. Laceration above the eye. Five by his roommate. EXAM: CT HEAD WITHOUT CONTRAST CT MAXILLOFACIAL WITHOUT CONTRAST CT CERVICAL SPINE WITHOUT CONTRAST TECHNIQUE: Multidetector CT imaging of the head, cervical spine, and maxillofacial structures were performed using the standard protocol without intravenous contrast. Multiplanar CT image reconstructions of the cervical spine and maxillofacial structures were also generated. COMPARISON:  11/26/2020 FINDINGS: CT HEAD FINDINGS Brain: No evidence of acute infarction, hemorrhage,  extra-axial collection, ventriculomegaly, or mass effect. Generalized cerebral atrophy. Periventricular white matter low attenuation likely secondary to microangiopathy. Vascular: Cerebrovascular atherosclerotic calcifications are noted. Skull: Negative for fracture or focal lesion. Sinuses/Orbits: Visualized portions of the orbits are unremarkable. Visualized portions of the paranasal sinuses are unremarkable. Visualized portions of the mastoid air cells are unremarkable. Other: None. CT MAXILLOFACIAL FINDINGS Osseous: No fracture or mandibular dislocation. No destructive process. Osteoarthritis of bilateral temporomandibular joints. Orbits: Negative. No traumatic or inflammatory finding. Sinuses: Mild mucosal thickening of bilateral maxillary sinuses. Periapical lucency around the left upper central incisor and right upper premolar as can be seen with dental disease. Soft tissues: Severe soft tissue swelling around the right orbit and cheek. CT CERVICAL SPINE FINDINGS Alignment: Normal. Skull base and vertebrae: No acute fracture. No primary bone lesion or focal pathologic process. Soft tissues and spinal canal: No prevertebral fluid or swelling. No visible canal hematoma. Disc levels: Anterior cervical fusion from C4 through C6 with plate and screw fixation. Broad osseous ridging at C4-5 and C5-6 eccentric towards the right with bilateral  right foraminal narrowing, most severe at C5-6. Upper chest: Lung apices are clear. Other: Bilateral carotid artery atherosclerosis. IMPRESSION: 1. No acute intracranial pathology. 2. No acute osseous injury of the maxillofacial bones. 3. No acute osseous injury of the cervical spine. 4. Anterior cervical fusion from C4 through C6 with plate and screw fixation. Broad osseous ridging at C4-5 and C5-6 eccentric towards the right with bilateral right foraminal narrowing, most severe at C5-6. Electronically Signed   By: Kathreen Devoid   On: 01/27/2021 15:45        Scheduled  Meds: . enoxaparin (LOVENOX) injection  40 mg Subcutaneous QHS  . folic acid  1 mg Oral Daily  . furosemide  40 mg Intravenous Q8H  . insulin aspart  0-5 Units Subcutaneous QHS  . insulin aspart  0-9 Units Subcutaneous TID WC  . multivitamin with minerals  1 tablet Oral Daily  . thiamine  100 mg Oral Daily   Or  . thiamine  100 mg Intravenous Daily   Continuous Infusions: . magnesium sulfate bolus IVPB 4 g (01/28/21 1121)     LOS: 1 day    Time spent: 39 minutes spent on chart review, discussion with nursing staff, consultants, updating family and interview/physical exam; more than 50% of that time was spent in counseling and/or coordination of care.    Alianny Toelle J [place] DO [facility] Available via Epic secure chat 7am-7pm After these hours, please refer to coverage provider listed on [email or web address] 01/28/2021, 12:30 PM

## 2021-01-29 ENCOUNTER — Telehealth: Payer: Self-pay

## 2021-01-29 ENCOUNTER — Telehealth: Payer: Self-pay | Admitting: Family Medicine

## 2021-01-29 LAB — COMPREHENSIVE METABOLIC PANEL
ALT: 33 U/L (ref 0–44)
AST: 47 U/L — ABNORMAL HIGH (ref 15–41)
Albumin: 2.6 g/dL — ABNORMAL LOW (ref 3.5–5.0)
Alkaline Phosphatase: 68 U/L (ref 38–126)
Anion gap: 9 (ref 5–15)
BUN: 23 mg/dL (ref 8–23)
CO2: 26 mmol/L (ref 22–32)
Calcium: 7.9 mg/dL — ABNORMAL LOW (ref 8.9–10.3)
Chloride: 82 mmol/L — ABNORMAL LOW (ref 98–111)
Creatinine, Ser: 1.46 mg/dL — ABNORMAL HIGH (ref 0.61–1.24)
GFR, Estimated: 54 mL/min — ABNORMAL LOW (ref >60)
Glucose, Bld: 132 mg/dL — ABNORMAL HIGH (ref 70–99)
Potassium: 3.2 mmol/L — ABNORMAL LOW (ref 3.5–5.1)
Sodium: 117 mmol/L — CL (ref 135–145)
Total Bilirubin: 0.7 mg/dL (ref 0.3–1.2)
Total Protein: 5.6 g/dL — ABNORMAL LOW (ref 6.5–8.1)

## 2021-01-29 LAB — GLUCOSE, CAPILLARY
Glucose-Capillary: 136 mg/dL — ABNORMAL HIGH (ref 70–99)
Glucose-Capillary: 134 mg/dL — ABNORMAL HIGH (ref 70–99)
Glucose-Capillary: 157 mg/dL — ABNORMAL HIGH (ref 70–99)
Glucose-Capillary: 171 mg/dL — ABNORMAL HIGH (ref 70–99)

## 2021-01-29 LAB — SODIUM: Sodium: 120 mmol/L — ABNORMAL LOW (ref 135–145)

## 2021-01-29 MED ORDER — FUROSEMIDE 10 MG/ML IJ SOLN
40.0000 mg | Freq: Two times a day (BID) | INTRAMUSCULAR | Status: DC
  Administered 2021-01-29 – 2021-01-30 (×2): 40 mg via INTRAVENOUS
  Filled 2021-01-29 (×2): qty 4

## 2021-01-29 MED ORDER — DEMECLOCYCLINE HCL 150 MG PO TABS
300.0000 mg | ORAL_TABLET | Freq: Two times a day (BID) | ORAL | Status: DC
  Administered 2021-01-29 – 2021-02-03 (×11): 300 mg via ORAL
  Filled 2021-01-29 (×11): qty 2

## 2021-01-29 MED ORDER — TOLVAPTAN 15 MG PO TABS
15.0000 mg | ORAL_TABLET | Freq: Once | ORAL | Status: DC
  Filled 2021-01-29: qty 1

## 2021-01-29 MED ORDER — POTASSIUM CHLORIDE CRYS ER 20 MEQ PO TBCR
40.0000 meq | EXTENDED_RELEASE_TABLET | ORAL | Status: AC
  Administered 2021-01-29 (×2): 40 meq via ORAL
  Filled 2021-01-29 (×2): qty 2

## 2021-01-29 NOTE — Telephone Encounter (Signed)
Call returned to patient. He said he was still in the hospital and ready to sleep and he would call this CM back another time

## 2021-01-29 NOTE — Evaluation (Signed)
Occupational Therapy Evaluation Patient Details Name: Brad [name] MRN: [id number] DOB: May 19, 1961 Today's Date: 01/29/2021    History of Present Illness 60 y.o. male with medical history significant of chronic hyponatremia, chronic systolic congestive heart failure, neuropathy, paroxysmal atrial fibrillation, EtOH abuse, history of cocaine and tobacco abuse, CKD stage IIIa, hepatitis C, cervical spinal stenosis who presented to the ED via EMS after being found on the floor by his roommate this afternoon.  Patient apparently fell out of bed last night and had been on the floor since being found by roommate today.  Patient states has been feeling weak over the last 3 to 4 days.  Also reports his generalized edema has worsened and his home Lasix has been ineffective.  Pt admitted for acute on chronic hyponatremia and acute on chronic systolic CHF   Clinical Impression   Patient is currently requiring assistance with ADLs including moderate with toileting, with LE dressing, and with bathing, and full setup with seated grooming and UE dressing, all of which is below patient's typical baseline of being reportedly Modified independent.  During this evaluation, patient was limited by pain to head from fall, wounds to Bil great toes, unsteadiness on feet, and impaired ADL performance with need of assiatance for bowel care and hygiene, which has the potential to impact patient's safety and independence during functional mobility, as well as performance for ADLs. [place] "6-clicks" Daily Activity Inpatient Short Form score of 16/24 indicates 53.32 ADL impairment this session. Patient lives with his roommate, who is able to provide some supervision and assistance, but unsure how much.  Patient demonstrates good rehab potential, and should benefit from continued skilled occupational therapy services while in acute care to maximize safety, independence and quality of life at home.  Continued  occupational therapy services in the home is recommended.  ?    Follow Up Recommendations  Home health OT (Pt reports he will have to speak with roommates as they "don't like people coming around.")    Equipment Recommendations  3 in 1 bedside commode    Recommendations for Other Services       Precautions / Restrictions Precautions Precautions: Fall Restrictions Weight Bearing Restrictions: No      Mobility Bed Mobility Overal bed mobility: Needs Assistance Bed Mobility: Supine to Sit;Sit to Supine     Supine to sit: Supervision;HOB elevated Sit to supine: Supervision;HOB elevated        Transfers Overall transfer level: Needs assistance Equipment used: Rolling walker (2 wheeled) Transfers: Sit to/from [facility] Sit to Stand: Min assist;From elevated surface Stand pivot transfers: Min guard       General transfer comment: Min Assistance needed to power up from EOB and recliner.    Balance Overall balance assessment: History of Falls;Needs assistance Sitting-balance support: Feet supported;Single extremity supported Sitting balance-Leahy Scale: Good       Standing balance-Leahy Scale: Poor Standing balance comment: Unsteady in standing. Needs at least unilateral support on RW for static standing and external assistance.                           ADL either performed or assessed with clinical judgement   ADL Overall ADL's : Needs assistance/impaired Eating/Feeding: Independent   Grooming: Wash/dry hands;Sitting;Set up   Upper Body Bathing: Set up;Sitting   Lower Body Bathing: Moderate assistance;Sit to/from stand   Upper Body Dressing : Set up;Supervision/safety;Sitting   Lower Body Dressing: Moderate assistance Lower Body Dressing  Details (indicate cue type and reason): Pt able to perform figure 4 at EOB and doff/don socks with increased time/effort and supervision. Pt required Moderate assist to don mesh underwear over  feet and to pull over hips once standing. Toilet Transfer: Magazine features editor Details (indicate cue type and reason): Pt soiled bed. Pt pivoted to recliner with Min guard. Toileting- Clothing Manipulation and Hygiene: Moderate assistance;Sit to/from stand Toileting - Clothing Manipulation Details (indicate cue type and reason): Pt with condom cath. Pt found to be soiled at bed level. Pt stood and washed peri areas with Moderate assist for thouroughness and Min guard assist for standing balance with unilateral UE support on RW.     Functional mobility during ADLs: Min guard;Rolling walker       Vision Baseline Vision/History: Wears glasses Wears Glasses:  (Unavailable) Patient Visual Report: No change from baseline Additional Comments: Pt reports baseline poor vision.     Perception     Praxis      Pertinent Vitals/Pain Pain Assessment: 0-10 Pain Score: 10-Worst pain ever Faces Pain Scale: Hurts even more Pain Location: Front of head from fall. Pain Descriptors / Indicators: Tender;Aching Pain Intervention(s): Limited activity within patient's tolerance;Monitored during session;Patient requesting pain meds-RN notified;Repositioned (Requested pain meds via call bell.)     Hand Dominance Right   Extremity/Trunk Assessment Upper Extremity Assessment Upper Extremity Assessment: RUE deficits/detail RUE Deficits / Details: Shoulder very limited ROM with MMT 2-/5.  Elbow--grip: WFL RUE Sensation: WNL RUE Coordination: decreased fine motor   Lower Extremity Assessment Lower Extremity Assessment: LLE deficits/detail LLE Deficits / Details: Shoulder AROM to ~120 degrees. MMT grossly 3+/5 proximally WFL distally. LLE Coordination: decreased fine motor   Cervical / Trunk Assessment Cervical / Trunk Assessment: Normal   Communication Communication Communication: No difficulties   Cognition Arousal/Alertness: Awake/alert Behavior During Therapy: WFL for tasks  assessed/performed Overall Cognitive Status: No family/caregiver present to determine baseline cognitive functioning                                 General Comments: Pt is impulsive with decreased awareness of need of assistance. Responds well to cues.   General Comments       Exercises     Shoulder Instructions      Home Living Family/patient expects to be discharged to:: Private residence Living Arrangements:  (roommate) Available Help at Discharge: Friend(s);Available PRN/intermittently Type of Home: House (Pt corrected that he moved out of his apartment and into a house with his roommate) Home Access: Level entry;Ramped entrance (Ramp in back)     Home Layout: One level     Bathroom Shower/Tub: Walk-in shower (Pt stated walk-in-shower to OT and tub/shower to PT yesterday.)   Bathroom Toilet: Standard (broken seat)     Home Equipment: Cane - single point;Shower seat   Additional Comments: pt reports his roommate sold his walker because he wasn't using it      Prior Functioning/Environment Level of Independence: Independent        Comments: Pt depends on roommate or another friend for transportation to grocery store and doctor's appointments. Pt reports independence, but questionable in terms of hygiene/self care at baseline level.        OT Problem List: Decreased strength;Decreased coordination;Pain;Decreased range of motion      OT Treatment/Interventions: Self-care/ADL training;Therapeutic exercise;Therapeutic activities;Cognitive remediation/compensation;Energy conservation;Patient/family education;DME and/or AE instruction;Manual therapy    OT Goals(Current goals can be found  in the care plan section) Acute Rehab OT Goals Patient Stated Goal: To get a jacket, some underwear and to walk. OT Goal Formulation: With patient Time For Goal Achievement: 02/12/21 Potential to Achieve Goals: Fair ADL Goals Pt Will Perform Grooming: standing;with  modified independence Pt Will Perform Lower Body Bathing: sit to/from stand;with supervision Pt Will Transfer to Toilet: with modified independence;regular height toilet;ambulating Pt Will Perform Toileting - Clothing Manipulation and hygiene: with modified independence;sit to/from stand;sitting/lateral leans Additional ADL Goal #1: Pt will engage in min 5 standing functional activities without loss of balance, in order to demonstrate improved activity tolerance and balance needed to perform ADLs safely at home. Additional ADL Goal #2: Patient will tolerate BUE home exercise program 10 reps within pain-free ranges (try towel on table top x 4 sets), in an unsupported seated position, in order to improve upper body strength, endurance and core stability needed to complete home ADLs.  OT Frequency: Min 2X/week   Barriers to D/C: Decreased caregiver support          Co-evaluation              AM-PAC OT "6 Clicks" Daily Activity     Outcome Measure Help from another person eating meals?: None Help from another person taking care of personal grooming?: A Little Help from another person toileting, which includes using toliet, bedpan, or urinal?: A Lot Help from another person bathing (including washing, rinsing, drying)?: A Lot Help from another person to put on and taking off regular upper body clothing?: A Little Help from another person to put on and taking off regular lower body clothing?: A Lot 6 Click Score: 16   End of Session Equipment Utilized During Treatment: Rolling walker;Gait belt Nurse Communication: Patient requests pain meds  Activity Tolerance: Patient tolerated treatment well Patient left: in bed;with call bell/phone within reach;with bed alarm set  OT Visit Diagnosis: Unsteadiness on feet (R26.81);Pain;History of falling (Z91.81);Muscle weakness (generalized) (M62.81) Pain - part of body:  (head)                Time: 8850-2774 OT Time Calculation (min): 50  min Charges:  OT General Charges $OT Visit: 1 Visit OT Evaluation $OT Eval Low Complexity: 1 Low OT Treatments $Self Care/Home Management : 8-22 mins $Therapeutic Activity: 8-22 mins  Anderson Malta, OT Acute Rehab Services Office: [phone] 01/29/2021  Julien Girt 01/29/2021, 10:07 AM

## 2021-01-29 NOTE — Progress Notes (Addendum)
PROGRESS NOTE    Brad [name]  YQI:[id number] DOB: 11-17-1961 DOA: 01/27/2021 PCP: Charlott Rakes, MD    Brief Narrative:  Brad [name] is a 60 y.o. male with medical history significant of chronic hyponatremia, chronic systolic congestive heart failure, neuropathy, paroxysmal atrial fibrillation, EtOH abuse, history of cocaine and tobacco abuse, CKD stage IIIa, hepatitis C, cervical spinal stenosis who presented to the ED via EMS after being found on the floor by his roommate this afternoon.  Patient apparently fell out of bed last night and had been on the floor since being found by roommate today.  Patient states has been feeling weak over the last 3 to 4 days.  Also reports his generalized edema has worsened and his home Lasix has been ineffective.  Patient does not recall events entirely but denies loss of consciousness.  No recent changes in his home medication regimen.  No sick contacts.  Reports he is unvaccinated against Covid-19.  Also endorses headache where he fell and hit his head, denies visual changes, no chest pain, no palpitations, no fever/chills/night sweats, no nausea/vomiting/diarrhea, no abdominal pain, no paresthesias.  In the ED, Temperature 97.6, HR 72, RR 12, BP 161/73, SPO2 100% on room air.  WBC 5.3, hemoglobin 11.4, platelets 236.  Sodium 116 (121 on 01/15/21), potassium 4.1, chloride 79, CO2 25, BUN 13, creatinine 1.07, glucose 111.  AST 71, ALT 40.  BNP 1177.  CK 405.  Urinalysis with greater than 500 glucose, 5 ketones; otherwise unrevealing.  EtOH level less than 10.  UDS positive for opiates.  CT head with no acute intracranial abnormality.  CT C-spine with no acute osseous injury, but does note severe foraminal narrowing C5-C6 which is chronic.  Chest x-ray with vascular congestion.  L-spine x-ray with no acute osseous abnormality.  Patient was given Lasix 40 mg IV x1.  Hospital service consulted for further evaluation and management of severe hyponatremia with  volume overload secondary to acute on chronic systolic congestive heart failure exacerbation and mild rhabdomyolysis.   Assessment & Plan:   Principal Problem:   Hyponatremia Active Problems:   History of drug abuse (HCC)   Polysubstance abuse (HCC)   Chronic hepatitis C with cirrhosis ([facility])   Essential hypertension   DM type 2 (diabetes mellitus, type 2) (HCC)   CKD (chronic kidney disease) stage 3, GFR 30-59 ml/min (HCC)   Acute on chronic combined systolic and diastolic CHF (congestive heart failure) (HCC)   Chronic atrial fibrillation   Alcohol dependence syndrome (HCC)   Chronic systolic CHF (congestive heart failure) (HCC)   Acute on chronic hyponatremia Sodium level 116 on arrival, has history of chronic hyponatremia with sodium level in the 120s-130s.  Most recently sodium 121 on 01/15/2021.  Etiology likely multifactorial with severe volume overload with poorly controlled CHF combined with continued EtOH abuse.  Urine osmolality 237, serum osmolality 246, urine sodium <10. --Na [phone] --Furosemide 40 mg IV every 12 hours --Fluid restrict 1200 mL/day --Demeclocycline 300mg  PO BID --sodium level q8h; goal correction no more than 8-12 meq/L in a 24h period. --Monitor on telemetry  Acute on chronic systolic congestive heart failure Anasarca with diffuse peripheral/body wall edema TTE 08/01/2020 with LVEF 30-35%, LV moderately decreased function with global hypokinesis, LV mildly dilated, mild LVH, RV systolic function mildly reduced, LA/RA moderately dilated, IVC dilated in size.  Patient reports his home furosemide has been less effective over the past few weeks; and reports increasing swelling/weight gain. --Furosemide 40 mg IV every 12 hours --Strict I's  and O's and daily weights --Closely monitor BMP daily  Mild rhabdomyolysis Patient has been on the floor since last evening following fall.  Elevated CK level of 405.  Given patient's severe peripheral edema,  will avoid any further IV fluids given his CHF history. --CK 405>270 --Supportive care, pain control --Repeat CK level in a.m.  Hypokalemia Potassium 3.2, likely secondary to IV diuresis. Will replete. --Repeat electrolytes in a.m.  Transaminitis AST 71, ALT 40.  History of chronic hepatitis C, also with continued alcohol abuse, and likely hepatic congestion from volume overload. --Hold home statin --Follow CMP daily  Type 2 diabetes mellitus Hemoglobin A1c 6.6.  Farxiga 10 mg p.o. daily outpatient. --SSI for coverage --CBGs before every meal/at bedtime  Chronic atrial fibrillation Continues in atrial fibrillation on telemetry.  Not on chronic anticoagulation due to fall history and continued alcohol abuse. --Continue to monitor on telemetry  CKD stage IIIa Creatinine 1.07 on admission, at baseline. --Continue monitor BMP daily with aggressive IV diuresis as above  Polysubstance abuse with EtOH, cocaine, heroin, tobacco UDS positive for opiates.  EtOH level less than 10. Discussed with patient need for complete cessation of tobacco, alcohol and illicit drugs --TOC consult for substance abuse --CIWAA protocol with symptom triggered Ativan --Multivitamin, thiamine, folic acid  Chronic hepatitis C Treatment nave.  AST 71, ALT 40; consistent with continued alcohol abuse. --Outpatient follow-up with infectious disease  Recurrent falls secondary to EtOH abuse --PT/OT following; with home health recommendations; but patient does not know if roommates would allow  Cervical spine stenosis s/p ACDF 07/24/2020 No focal neurological findings on physical exam. --Outpatient follow-up    DVT prophylaxis: Lovenox   Code Status: Full Code Family Communication: Updated patient extensively at bedside  Disposition Plan:  Level of care: Telemetry Status is: Inpatient  Remains inpatient appropriate because:Ongoing diagnostic testing needed not appropriate for outpatient work  up, Unsafe d/c plan, IV treatments appropriate due to intensity of illness or inability to take PO and Inpatient level of care appropriate due to severity of illness   Dispo: The patient is from: Home              Anticipated d/c is to: Home              Anticipated d/c date is: 2 days              Patient currently is not medically stable to d/c.   Difficult to place patient No   Consultants:   None  Procedures:   none  Antimicrobials:   none   Subjective: Patient seen and examined at bedside, resting comfortably.  Reports edema to lower extremities and abdomen improved.  Patient refuses to adhere to fluid restriction, requesting more cups of ice chips.  Nursing reports that he is asked multiple people for more liquids over the past 24 hours.  Sodium level initially improving, now down to 117 again, likely secondary to patient's noncompliance.  Patient previously improved with Samsca during previous hospitalization, discussed with pharmacy and recommend starting with Demeclocycline as Samsca can have bleeding side effect in patients with cirrhosis.  No other specific complaints this morning. Denies headache, no visual changes, no chest pain, no palpitations, no shortness of breath, no abdominal pain.  No acute events overnight per nursing staff.  Objective: Vitals:   01/28/21 1830 01/28/21 1955 01/28/21 2250 01/29/21 0428  BP: (!) 148/85 110/73 120/88 124/86  Pulse: 66 79 71 74  Resp: 17 16 16 18   Temp:  98.2 F (36.8 C) 97.7 F (36.5 C) 98.1 F (36.7 C)  TempSrc:  Oral Oral Oral  SpO2: 97% 99% 98% 94%  Weight:  86.7 kg  86.7 kg  Height:        Intake/Output Summary (Last 24 hours) at 01/29/2021 1247 Last data filed at 01/29/2021 1112 Gross per 24 hour  Intake 456.67 ml  Output 1950 ml  Net -1493.33 ml   Filed Weights   01/27/21 1447 01/28/21 1955 01/29/21 0428  Weight: 79 kg 86.7 kg 86.7 kg    Examination:  General exam: Appears calm and comfortable, poor  dentition, appears older than stated age, chronically ill in appearance Respiratory system: Clear to auscultation. Respiratory effort normal. On room air Cardiovascular system: S1 & S2 heard, RRR. No JVD, murmurs, rubs, gallops or clicks. 2-3+ pitting edema bilateral lower extremities up to thigh Gastrointestinal system: Abdomen is nondistended, soft and nontender. No organomegaly or masses felt. Normal bowel sounds heard. Central nervous system: Alert and oriented. No focal neurological deficits. Extremities: Symmetric 5 x 5 power. Skin: Ecchymosis noted to right forehead/periorbital region, abrasions noted to bilateral great toes Psychiatry: Judgement and insight appear poor. Mood & affect appropriate.     Data Reviewed: I have personally reviewed following labs and imaging studies  CBC: Recent Labs  Lab 01/27/21 1554  WBC 5.3  NEUTROABS 4.4  HGB 11.4*  HCT 33.5*  MCV 82.1  PLT 235   Basic Metabolic Panel: Recent Labs  Lab 01/27/21 1554 01/27/21 2352 01/28/21 0511 01/28/21 1345 01/28/21 2139 01/29/21 0534  NA 116* 118* 118* 119* 118* 117*  K 4.1  --  3.7  --   --  3.2*  CL 79*  --  83*  --   --  82*  CO2 25  --  25  --   --  26  GLUCOSE 111*  --  110*  --   --  132*  BUN 13  --  14  --   --  23  CREATININE 1.07  --  1.15  --   --  1.46*  CALCIUM 8.4*  --  7.9*  --   --  7.9*  MG  --   --  1.5*  --   --   --   PHOS  --   --  2.7  --   --   --    GFR: Estimated Creatinine Clearance: 56.6 mL/min (A) (by C-G formula based on SCr of 1.46 mg/dL (H)). Liver Function Tests: Recent Labs  Lab 01/27/21 1554 01/28/21 0511 01/29/21 0534  AST 71* 67* 47*  ALT 40 39 33  ALKPHOS 73 65 68  BILITOT 1.6* 1.3* 0.7  PROT 6.4* 5.8* 5.6*  ALBUMIN 2.9* 2.6* 2.6*   No results for input(s): LIPASE, AMYLASE in the last 168 hours. No results for input(s): AMMONIA in the last 168 hours. Coagulation Profile: No results for input(s): INR, PROTIME in the last 168 hours. Cardiac  Enzymes: Recent Labs  Lab 01/27/21 1554 01/28/21 0511  CKTOTAL 405* 270   BNP (last 3 results) No results for input(s): PROBNP in the last 8760 hours. HbA1C: Recent Labs    01/28/21 0511  HGBA1C 6.8*   CBG: Recent Labs  Lab 01/28/21 1234 01/28/21 1739 01/28/21 2153 01/29/21 0732 01/29/21 1200  GLUCAP 132* 168* 126* 136* 134*   Lipid Profile: No results for input(s): CHOL, HDL, LDLCALC, TRIG, CHOLHDL, LDLDIRECT in the last 72 hours. Thyroid Function Tests: Recent Labs    01/27/21  1554  TSH 4.079   Anemia Panel: No results for input(s): VITAMINB12, FOLATE, FERRITIN, TIBC, IRON, RETICCTPCT in the last 72 hours. Sepsis Labs: No results for input(s): PROCALCITON, LATICACIDVEN in the last 168 hours.  Recent Results (from the past 240 hour(s))  SARS CORONAVIRUS 2 (TAT 6-24 HRS) Nasopharyngeal Nasopharyngeal Swab     Status: None   Collection Time: 01/27/21  5:09 PM   Specimen: Nasopharyngeal Swab  Result Value Ref Range Status   SARS Coronavirus 2 NEGATIVE NEGATIVE Final    Comment: (NOTE) SARS-CoV-2 target nucleic acids are NOT DETECTED.  The SARS-CoV-2 RNA is generally detectable in upper and lower respiratory specimens during the acute phase of infection. Negative results do not preclude SARS-CoV-2 infection, do not rule out co-infections with other pathogens, and should not be used as the sole basis for treatment or other patient management decisions. Negative results must be combined with clinical observations, patient history, and epidemiological information. The expected result is Negative.  Fact Sheet for Patients: SugarRoll.be  Fact Sheet for Healthcare Providers: https://www.woods-mathews.com/  This test is not yet approved or cleared by the [place] FDA and  has been authorized for detection and/or diagnosis of SARS-CoV-2 by FDA under an Emergency Use Authorization (EUA). This EUA will remain  in effect  (meaning this test can be used) for the duration of the COVID-19 declaration under Se ction 564(b)(1) of the Act, 21 U.S.C. section 360bbb-3(b)(1), unless the authorization is terminated or revoked sooner.  Performed at [facility], [place] [address], [place], [place] [address]          Radiology Studies: DG Lumbar Spine 2-3 Views  Result Date: 01/27/2021 CLINICAL DATA:  Pain after fall EXAM: LUMBAR SPINE - 2-3 VIEW COMPARISON:  10/02/2017, levin 1721 FINDINGS: Frontal and lateral views of the lumbar spine demonstrate 5 non-rib-bearing lumbar type vertebral bodies in stable anatomic alignment. No acute displaced fracture. Disc spaces are well preserved. Stable mild facet hypertrophy at the lumbosacral junction. Diffuse atherosclerosis unchanged. IMPRESSION: 1. No acute lumbar spine fracture. Electronically Signed   By: Randa Ngo M.D.   On: 01/27/2021 15:53   CT Head Wo Contrast  Result Date: 01/27/2021 CLINICAL DATA:  Status post fall. Laceration above the eye. Five by his roommate. EXAM: CT HEAD WITHOUT CONTRAST CT MAXILLOFACIAL WITHOUT CONTRAST CT CERVICAL SPINE WITHOUT CONTRAST TECHNIQUE: Multidetector CT imaging of the head, cervical spine, and maxillofacial structures were performed using the standard protocol without intravenous contrast. Multiplanar CT image reconstructions of the cervical spine and maxillofacial structures were also generated. COMPARISON:  11/26/2020 FINDINGS: CT HEAD FINDINGS Brain: No evidence of acute infarction, hemorrhage, extra-axial collection, ventriculomegaly, or mass effect. Generalized cerebral atrophy. Periventricular white matter low attenuation likely secondary to microangiopathy. Vascular: Cerebrovascular atherosclerotic calcifications are noted. Skull: Negative for fracture or focal lesion. Sinuses/Orbits: Visualized portions of the orbits are unremarkable. Visualized portions of the paranasal sinuses are unremarkable. Visualized portions of the  mastoid air cells are unremarkable. Other: None. CT MAXILLOFACIAL FINDINGS Osseous: No fracture or mandibular dislocation. No destructive process. Osteoarthritis of bilateral temporomandibular joints. Orbits: Negative. No traumatic or inflammatory finding. Sinuses: Mild mucosal thickening of bilateral maxillary sinuses. Periapical lucency around the left upper central incisor and right upper premolar as can be seen with dental disease. Soft tissues: Severe soft tissue swelling around the right orbit and cheek. CT CERVICAL SPINE FINDINGS Alignment: Normal. Skull base and vertebrae: No acute fracture. No primary bone lesion or focal pathologic process. Soft tissues and spinal canal:  No prevertebral fluid or swelling. No visible canal hematoma. Disc levels: Anterior cervical fusion from C4 through C6 with plate and screw fixation. Broad osseous ridging at C4-5 and C5-6 eccentric towards the right with bilateral right foraminal narrowing, most severe at C5-6. Upper chest: Lung apices are clear. Other: Bilateral carotid artery atherosclerosis. IMPRESSION: 1. No acute intracranial pathology. 2. No acute osseous injury of the maxillofacial bones. 3. No acute osseous injury of the cervical spine. 4. Anterior cervical fusion from C4 through C6 with plate and screw fixation. Broad osseous ridging at C4-5 and C5-6 eccentric towards the right with bilateral right foraminal narrowing, most severe at C5-6. Electronically Signed   By: Kathreen Devoid   On: 01/27/2021 15:45   CT Cervical Spine Wo Contrast  Result Date: 01/27/2021 CLINICAL DATA:  Status post fall. Laceration above the eye. Five by his roommate. EXAM: CT HEAD WITHOUT CONTRAST CT MAXILLOFACIAL WITHOUT CONTRAST CT CERVICAL SPINE WITHOUT CONTRAST TECHNIQUE: Multidetector CT imaging of the head, cervical spine, and maxillofacial structures were performed using the standard protocol without intravenous contrast. Multiplanar CT image reconstructions of the cervical spine  and maxillofacial structures were also generated. COMPARISON:  11/26/2020 FINDINGS: CT HEAD FINDINGS Brain: No evidence of acute infarction, hemorrhage, extra-axial collection, ventriculomegaly, or mass effect. Generalized cerebral atrophy. Periventricular white matter low attenuation likely secondary to microangiopathy. Vascular: Cerebrovascular atherosclerotic calcifications are noted. Skull: Negative for fracture or focal lesion. Sinuses/Orbits: Visualized portions of the orbits are unremarkable. Visualized portions of the paranasal sinuses are unremarkable. Visualized portions of the mastoid air cells are unremarkable. Other: None. CT MAXILLOFACIAL FINDINGS Osseous: No fracture or mandibular dislocation. No destructive process. Osteoarthritis of bilateral temporomandibular joints. Orbits: Negative. No traumatic or inflammatory finding. Sinuses: Mild mucosal thickening of bilateral maxillary sinuses. Periapical lucency around the left upper central incisor and right upper premolar as can be seen with dental disease. Soft tissues: Severe soft tissue swelling around the right orbit and cheek. CT CERVICAL SPINE FINDINGS Alignment: Normal. Skull base and vertebrae: No acute fracture. No primary bone lesion or focal pathologic process. Soft tissues and spinal canal: No prevertebral fluid or swelling. No visible canal hematoma. Disc levels: Anterior cervical fusion from C4 through C6 with plate and screw fixation. Broad osseous ridging at C4-5 and C5-6 eccentric towards the right with bilateral right foraminal narrowing, most severe at C5-6. Upper chest: Lung apices are clear. Other: Bilateral carotid artery atherosclerosis. IMPRESSION: 1. No acute intracranial pathology. 2. No acute osseous injury of the maxillofacial bones. 3. No acute osseous injury of the cervical spine. 4. Anterior cervical fusion from C4 through C6 with plate and screw fixation. Broad osseous ridging at C4-5 and C5-6 eccentric towards the right  with bilateral right foraminal narrowing, most severe at C5-6. Electronically Signed   By: Kathreen Devoid   On: 01/27/2021 15:45   DG Chest Port 1 View  Result Date: 01/27/2021 CLINICAL DATA:  Weakness, fell EXAM: PORTABLE CHEST 1 VIEW COMPARISON:  01/15/2021 FINDINGS: Single frontal view of the chest demonstrates stable enlargement of the cardiac silhouette. There is central vascular congestion, with mild perihilar ground-glass airspace disease consistent with mild congestive heart failure. No effusion or pneumothorax. No acute fracture. IMPRESSION: 1. Mild congestive heart failure. Electronically Signed   By: Randa Ngo M.D.   On: 01/27/2021 15:54   CT Maxillofacial Wo Contrast  Result Date: 01/27/2021 CLINICAL DATA:  Status post fall. Laceration above the eye. Five by his roommate. EXAM: CT HEAD WITHOUT CONTRAST CT MAXILLOFACIAL WITHOUT CONTRAST  CT CERVICAL SPINE WITHOUT CONTRAST TECHNIQUE: Multidetector CT imaging of the head, cervical spine, and maxillofacial structures were performed using the standard protocol without intravenous contrast. Multiplanar CT image reconstructions of the cervical spine and maxillofacial structures were also generated. COMPARISON:  11/26/2020 FINDINGS: CT HEAD FINDINGS Brain: No evidence of acute infarction, hemorrhage, extra-axial collection, ventriculomegaly, or mass effect. Generalized cerebral atrophy. Periventricular white matter low attenuation likely secondary to microangiopathy. Vascular: Cerebrovascular atherosclerotic calcifications are noted. Skull: Negative for fracture or focal lesion. Sinuses/Orbits: Visualized portions of the orbits are unremarkable. Visualized portions of the paranasal sinuses are unremarkable. Visualized portions of the mastoid air cells are unremarkable. Other: None. CT MAXILLOFACIAL FINDINGS Osseous: No fracture or mandibular dislocation. No destructive process. Osteoarthritis of bilateral temporomandibular joints. Orbits: Negative. No  traumatic or inflammatory finding. Sinuses: Mild mucosal thickening of bilateral maxillary sinuses. Periapical lucency around the left upper central incisor and right upper premolar as can be seen with dental disease. Soft tissues: Severe soft tissue swelling around the right orbit and cheek. CT CERVICAL SPINE FINDINGS Alignment: Normal. Skull base and vertebrae: No acute fracture. No primary bone lesion or focal pathologic process. Soft tissues and spinal canal: No prevertebral fluid or swelling. No visible canal hematoma. Disc levels: Anterior cervical fusion from C4 through C6 with plate and screw fixation. Broad osseous ridging at C4-5 and C5-6 eccentric towards the right with bilateral right foraminal narrowing, most severe at C5-6. Upper chest: Lung apices are clear. Other: Bilateral carotid artery atherosclerosis. IMPRESSION: 1. No acute intracranial pathology. 2. No acute osseous injury of the maxillofacial bones. 3. No acute osseous injury of the cervical spine. 4. Anterior cervical fusion from C4 through C6 with plate and screw fixation. Broad osseous ridging at C4-5 and C5-6 eccentric towards the right with bilateral right foraminal narrowing, most severe at C5-6. Electronically Signed   By: Kathreen Devoid   On: 01/27/2021 15:45        Scheduled Meds: . demeclocycline  300 mg Oral Q12H  . enoxaparin (LOVENOX) injection  40 mg Subcutaneous QHS  . folic acid  1 mg Oral Daily  . furosemide  40 mg Intravenous Q12H  . insulin aspart  0-5 Units Subcutaneous QHS  . insulin aspart  0-9 Units Subcutaneous TID WC  . multivitamin with minerals  1 tablet Oral Daily  . potassium chloride  40 mEq Oral Q3H  . thiamine  100 mg Oral Daily   Or  . thiamine  100 mg Intravenous Daily   Continuous Infusions:    LOS: 2 days    Time spent: 39 minutes spent on chart review, discussion with nursing staff, consultants, updating family and interview/physical exam; more than 50% of that time was spent in  counseling and/or coordination of care.    Jase Himmelberger J [place] DO [facility] Available via Epic secure chat 7am-7pm After these hours, please refer to coverage provider listed on [email or web address] 01/29/2021, 12:47 PM

## 2021-01-29 NOTE — Telephone Encounter (Signed)
Copied from [place] [phone]. Topic: General - Other >> Jan 29, 2021  8:50 AM Celene Kras wrote: Reason for CRM: Pt calling and is requesting to speak with [name]. He states that he just got out of the hospital. Please advise.

## 2021-01-29 NOTE — Progress Notes (Signed)
CRITICAL VALUE ALERT  Critical Value:  Sodium 117   Date & Time Notied:  01/29/2021   Provider Notified: Jonny Ruiz NP   Orders Received/Actions taken: Awaiting response

## 2021-01-29 NOTE — Progress Notes (Signed)
   01/29/21 1200  Mobility  Activity Ambulated in hall  Level of Assistance Moderate assist, patient does 50-74%  Assistive Device Front wheel walker  Distance Ambulated (ft) 188 ft  Mobility Response Tolerated well  Mobility performed by Mobility specialist  $Mobility charge 1 Mobility   Mobility Specialist: Progress Note  Pre-Mobility: 70 HR, 99% SpO2 During Mobility: 68 HR, 94% SpO2 Post-Mobility: 66 HR, 98% SpO2  Pt tolerated ambulation well. Sats remained stable on room air pre, during, and post mobility. Bed was changed and pt cleaned. Pt was left in bed with food tray and call bell in reach. Bed alarm was set.   Dareen Gutzwiller Mobility Specialist/Rehab Tech Acute Re[facility] [employer]

## 2021-01-30 ENCOUNTER — Telehealth: Payer: Self-pay | Admitting: Internal Medicine

## 2021-01-30 DIAGNOSIS — E871 Hypo-osmolality and hyponatremia: Secondary | ICD-10-CM

## 2021-01-30 LAB — COMPREHENSIVE METABOLIC PANEL
ALT: 31 U/L (ref 0–44)
AST: 42 U/L — ABNORMAL HIGH (ref 15–41)
Albumin: 2.5 g/dL — ABNORMAL LOW (ref 3.5–5.0)
Alkaline Phosphatase: 60 U/L (ref 38–126)
Anion gap: 11 (ref 5–15)
BUN: 29 mg/dL — ABNORMAL HIGH (ref 8–23)
CO2: 25 mmol/L (ref 22–32)
Calcium: 8.1 mg/dL — ABNORMAL LOW (ref 8.9–10.3)
Chloride: 85 mmol/L — ABNORMAL LOW (ref 98–111)
Creatinine, Ser: 1.49 mg/dL — ABNORMAL HIGH (ref 0.61–1.24)
GFR, Estimated: 53 mL/min — ABNORMAL LOW (ref >60)
Glucose, Bld: 136 mg/dL — ABNORMAL HIGH (ref 70–99)
Potassium: 3.2 mmol/L — ABNORMAL LOW (ref 3.5–5.1)
Sodium: 121 mmol/L — ABNORMAL LOW (ref 135–145)
Total Bilirubin: 0.5 mg/dL (ref 0.3–1.2)
Total Protein: 5.5 g/dL — ABNORMAL LOW (ref 6.5–8.1)

## 2021-01-30 LAB — BASIC METABOLIC PANEL
Anion gap: 11 (ref 5–15)
BUN: 30 mg/dL — ABNORMAL HIGH (ref 8–23)
CO2: 28 mmol/L (ref 22–32)
Calcium: 8.1 mg/dL — ABNORMAL LOW (ref 8.9–10.3)
Chloride: 85 mmol/L — ABNORMAL LOW (ref 98–111)
Creatinine, Ser: 1.47 mg/dL — ABNORMAL HIGH (ref 0.61–1.24)
GFR, Estimated: 54 mL/min — ABNORMAL LOW (ref >60)
Glucose, Bld: 148 mg/dL — ABNORMAL HIGH (ref 70–99)
Potassium: 2.8 mmol/L — ABNORMAL LOW (ref 3.5–5.1)
Sodium: 124 mmol/L — ABNORMAL LOW (ref 135–145)

## 2021-01-30 LAB — GLUCOSE, CAPILLARY
Glucose-Capillary: 149 mg/dL — ABNORMAL HIGH (ref 70–99)
Glucose-Capillary: 219 mg/dL — ABNORMAL HIGH (ref 70–99)
Glucose-Capillary: 77 mg/dL (ref 70–99)
Glucose-Capillary: 178 mg/dL — ABNORMAL HIGH (ref 70–99)

## 2021-01-30 LAB — BRAIN NATRIURETIC PEPTIDE: B Natriuretic Peptide: 1332.6 pg/mL — ABNORMAL HIGH (ref 0.0–100.0)

## 2021-01-30 LAB — MAGNESIUM: Magnesium: 1.8 mg/dL (ref 1.7–2.4)

## 2021-01-30 MED ORDER — FUROSEMIDE 10 MG/ML IJ SOLN
40.0000 mg | Freq: Two times a day (BID) | INTRAMUSCULAR | Status: DC
  Administered 2021-01-30 – 2021-02-02 (×6): 40 mg via INTRAVENOUS
  Filled 2021-01-30 (×6): qty 4

## 2021-01-30 MED ORDER — POTASSIUM CHLORIDE CRYS ER 20 MEQ PO TBCR
40.0000 meq | EXTENDED_RELEASE_TABLET | ORAL | Status: AC
  Administered 2021-01-30 (×3): 40 meq via ORAL
  Filled 2021-01-30 (×3): qty 2

## 2021-01-30 MED ORDER — LORAZEPAM 2 MG/ML IJ SOLN
1.0000 mg | Freq: Once | INTRAMUSCULAR | Status: AC
  Administered 2021-01-31: 1 mg via INTRAVENOUS
  Filled 2021-01-30: qty 1

## 2021-01-30 MED ORDER — POTASSIUM CHLORIDE CRYS ER 20 MEQ PO TBCR
40.0000 meq | EXTENDED_RELEASE_TABLET | Freq: Once | ORAL | Status: AC
  Administered 2021-01-30: 40 meq via ORAL
  Filled 2021-01-30: qty 2

## 2021-01-30 NOTE — Progress Notes (Signed)
PT Cancellation Note  Patient Details Name: Brad [name] MRN: [id number] DOB: 21-Feb-1961   Cancelled Treatment:    Reason Eval/Treat Not Completed: Fatigue/lethargy limiting ability to participate;Patient declined, no reason specified (patient lethargic and confused resting in bed. Pt aroused to tactile/verbal stimulus but drfiting back to sleep. Pt declined therapy at this time when he was alert to answer. Will follow up at later date/time as schedule allows.)  Verner Mould, [place] Office [phone] Pager [phone]   Jacques Navy 01/30/2021, 12:19 PM

## 2021-01-30 NOTE — Progress Notes (Signed)
PROGRESS NOTE    Brad [name]  VOZ:[id number] DOB: [birth year] DOA: 01/27/2021 PCP: Charlott Rakes, MD   Chief Complaint  Patient presents with  . Fall   Brief Narrative:  Brad [name] 60 y.o.malewith medical history significant ofchronic hyponatremia, chronic systolic congestive heart failure, neuropathy, paroxysmal atrial fibrillation, EtOH abuse, history of cocaine and tobacco abuse, CKD stage IIIa, hepatitis C, cervical spinal stenosis who presented to the ED via EMS after being found on the floor by his roommate this afternoon. Patient apparently fell out of bed last night and had been on the floor since being found by roommate today. Patient states has been feeling weak over the last 3 to 4 days. Also reports his generalized edema has worsened and his home Lasix has been ineffective. Patient does not recall events entirely but denies loss of consciousness. No recent changes in his home medication regimen. No sick contacts. Reports he is unvaccinated against Covid-19. Also endorses headache where he fell and hit his head, denies visual changes, no chest pain, no palpitations, no fever/chills/night sweats, no nausea/vomiting/diarrhea, no abdominal pain, no paresthesias.  He's been admitted with hyponatremia 2/2 volume overload with HF exacerbation.  Assessment & Plan:   Principal Problem:   Hyponatremia Active Problems:   History of drug abuse (HCC)   Polysubstance abuse (HCC)   Chronic hepatitis C with cirrhosis ([place])   Essential hypertension   DM type 2 (diabetes mellitus, type 2) (HCC)   CKD (chronic kidney disease) stage 3, GFR 30-59 ml/min (HCC)   Acute on chronic combined systolic and diastolic CHF (congestive heart failure) (HCC)   Chronic atrial fibrillation   Alcohol dependence syndrome (HCC)   Chronic systolic CHF (congestive heart failure) (HCC)  Acute on chronic hyponatremia  Hypervolemic Hyponatremia Sodium level 116 on arrival, has history of  chronic hyponatremia with sodium level in the 120s-130s. Most recently sodium 121 on 01/15/2021. Etiology likely multifactorial with severe volume overload with poorly controlled CHF combined with continued EtOH abuse.  Urine osmolality 237, serum osmolality 246, urine sodium <10 (suggesting sodium avid state -> hypervolemia). --Na 116 on presentation -- 124 today, gradually improving --lasix 40 IV BID  --Fluid restrict 1200 mL/day, low sodium diet - dietitian education --Demeclocycline 300mg  PO BID --continue to follow intermittent sodium  --goal correction no more than8-12 meq/L in Mayia Megill 24h period. --Monitor on telemetry  Acute on chronic systolic congestive heart failure Anasarca with diffuse peripheral/body wall edema TTE 08/01/2020 with LVEF 30-35%, LV moderately decreased function with global hypokinesis, LV mildly dilated, mild LVH, RV systolic function mildly reduced, LA/RA moderately dilated, IVC dilated in size. Patient reports his home furosemide has been less effective over the past few weeks;and reports increasing swelling/weight gain. --Furosemide 40 mg IV every 12 hours (supposed to take 80 q am and 40 q PM - unclear if he's adherent with this regimen - 80 PO would be roughly equivalent to 40 IV he's getting here) --Strict I's and O's and daily weights --Closely monitor BMP daily  Mild rhabdomyolysis Patient has been on the floor since last evening following fall. Elevated CK level of 405. Given patient's severe peripheral edema, will avoid any further IV fluids given his CHF history. --CK 405>270 resolved  Hypokalemia --replace and follow  --Repeat electrolytes in Noal Abshier.m.  Transaminitis AST 71, ALT 40. History of chronic hepatitis C, also with continued alcohol abuse, and likely hepatic congestion from volume overload. --Hold home statin --Follow CMP daily --telephone note, looks like he was discharged from GI practice -  will follow up  Type 2 diabetes  mellitus Hemoglobin A1c 6.6. Farxiga 10 mg p.o. daily outpatient. --SSI for coverage --CBGs before every meal/at bedtime  Chronic atrial fibrillation Continues in atrial fibrillation on telemetry. Not on chronic anticoagulation due to fall history and continued alcohol abuse. --Continue to monitor on telemetry  AKI on CKD stage IIIa Creatinine 1.07 on admission, at baseline. --rising with diuresis, follow   Polysubstance abuse with EtOH, cocaine, heroin, tobacco UDS positive for opiates.EtOH level less than 10. Discussed with patient need for complete cessation of tobacco, alcohol and illicit drugs --TOC consult forsubstance abuse --CIWAA protocolwith symptom triggered Ativan --Multivitamin, thiamine, folic acid  Chronic hepatitis C Treatment nave. AST 71, ALT 40; consistent with continued alcohol abuse. --Outpatient follow-up with infectious disease  Recurrent falls secondary to EtOH abuse --PT/OT following; with home health recommendations; but patient does not know if roommates would allow  Cervical spine stenosiss/p ACDF 07/24/2020 No focal neurological findings on physical exam. --Outpatient follow-up  DVT prophylaxis: lovenox Code Status: full  Family Communication: none at bedsidde Disposition:   Status is: Inpatient  Remains inpatient appropriate because:Inpatient level of care appropriate due to severity of illness   Dispo:  Patient From: Home  Planned Disposition: Home  Expected discharge date: 01/31/2021  Medically stable for discharge: No         Consultants:   none  Procedures:   none  Antimicrobials:  Anti-infectives (From admission, onward)   Start     Dose/Rate Route Frequency Ordered Stop   01/29/21 0900  demeclocycline (DECLOMYCIN) tablet 300 mg        300 mg Oral Every 12 hours 01/29/21 0819        Subjective: No new complaints  Objective: Vitals:   01/29/21 0428 01/29/21 1316 01/29/21 2123 01/30/21 0547  BP:  124/86 121/86 115/83 122/84  Pulse: 74 68 70 78  Resp: 18 16 16 14   Temp: 98.1 F (36.7 C) 98 F (36.7 C) (!) 97.4 F (36.3 C) 97.8 F (36.6 C)  TempSrc: Oral Oral Oral Oral  SpO2: 94% 99% 98% 100%  Weight: 86.7 kg   86 kg  Height:        Intake/Output Summary (Last 24 hours) at 01/30/2021 1844 Last data filed at 01/30/2021 1800 Gross per 24 hour  Intake 1440 ml  Output 3000 ml  Net -1560 ml   Filed Weights   01/28/21 1955 01/29/21 0428 01/30/21 0547  Weight: 86.7 kg 86.7 kg 86 kg    Examination:  General exam: Appears calm and comfortable  Respiratory system: Clear to auscultation. Respiratory effort normal. Cardiovascular system: S1 & S2 heard, RRR. +JVD Gastrointestinal system: Abdomen is nondistended, soft and nontender. Central nervous system: Alert and oriented. No focal neurological deficits. Extremities: bilateral LE edema Skin: No rashes, lesions or ulcers Psychiatry: Judgement and insight appear normal. Mood & affect appropriate.     Data Reviewed: I have personally reviewed following labs and imaging studies  CBC: Recent Labs  Lab 01/27/21 1554  WBC 5.3  NEUTROABS 4.4  HGB 11.4*  HCT 33.5*  MCV 82.1  PLT 027    Basic Metabolic Panel: Recent Labs  Lab 01/27/21 1554 01/27/21 2352 01/28/21 0511 01/28/21 1345 01/28/21 2139 01/29/21 0534 01/29/21 2117 01/30/21 0552 01/30/21 1335  NA 116*   < > 118*   < > 118* 117* 120* 121* 124*  K 4.1  --  3.7  --   --  3.2*  --  3.2* 2.8*  CL 79*  --  83*  --   --  82*  --  85* 85*  CO2 25  --  25  --   --  26  --  25 28  GLUCOSE 111*  --  110*  --   --  132*  --  136* 148*  BUN 13  --  14  --   --  23  --  29* 30*  CREATININE 1.07  --  1.15  --   --  1.46*  --  1.49* 1.47*  CALCIUM 8.4*  --  7.9*  --   --  7.9*  --  8.1* 8.1*  MG  --   --  1.5*  --   --   --   --  1.8  --   PHOS  --   --  2.7  --   --   --   --   --   --    < > = values in this interval not displayed.    GFR: Estimated Creatinine  Clearance: 56.2 mL/min (Zebediah Beezley) (by C-G formula based on SCr of 1.47 mg/dL (H)).  Liver Function Tests: Recent Labs  Lab 01/27/21 1554 01/28/21 0511 01/29/21 0534 01/30/21 0552  AST 71* 67* 47* 42*  ALT 40 39 33 31  ALKPHOS 73 65 68 60  BILITOT 1.6* 1.3* 0.7 0.5  PROT 6.4* 5.8* 5.6* 5.5*  ALBUMIN 2.9* 2.6* 2.6* 2.5*    CBG: Recent Labs  Lab 01/29/21 1616 01/29/21 2119 01/30/21 0742 01/30/21 1213 01/30/21 1630  GLUCAP 157* 171* 149* 219* 77     Recent Results (from the past 240 hour(s))  SARS CORONAVIRUS 2 (TAT 6-24 HRS) Nasopharyngeal Nasopharyngeal Swab     Status: None   Collection Time: 01/27/21  5:09 PM   Specimen: Nasopharyngeal Swab  Result Value Ref Range Status   SARS Coronavirus 2 NEGATIVE NEGATIVE Final    Comment: (NOTE) SARS-CoV-2 target nucleic acids are NOT DETECTED.  The SARS-CoV-2 RNA is generally detectable in upper and lower respiratory specimens during the acute phase of infection. Negative results do not preclude SARS-CoV-2 infection, do not rule out co-infections with other pathogens, and should not be used as the sole basis for treatment or other patient management decisions. Negative results must be combined with clinical observations, patient history, and epidemiological information. The expected result is Negative.  Fact Sheet for Patients: SugarRoll.be  Fact Sheet for Healthcare Providers: https://www.woods-mathews.com/  This test is not yet approved or cleared by the [place] FDA and  has been authorized for detection and/or diagnosis of SARS-CoV-2 by FDA under an Emergency Use Authorization (EUA). This EUA will remain  in effect (meaning this test can be used) for the duration of the COVID-19 declaration under Se ction 564(b)(1) of the Act, 21 U.S.C. section 360bbb-3(b)(1), unless the authorization is terminated or revoked sooner.  Performed at [facility], [place] [address]  [place], [place] [address]          Radiology Studies: No results found.      Scheduled Meds: . demeclocycline  300 mg Oral Q12H  . enoxaparin (LOVENOX) injection  40 mg Subcutaneous QHS  . folic acid  1 mg Oral Daily  . furosemide  40 mg Intravenous BID  . insulin aspart  0-5 Units Subcutaneous QHS  . insulin aspart  0-9 Units Subcutaneous TID WC  . multivitamin with minerals  1 tablet Oral Daily  . thiamine  100 mg Oral Daily   Or  .  thiamine  100 mg Intravenous Daily   Continuous Infusions:   LOS: 3 days    Time spent: over 30 min    Fayrene Helper, MD [facility]   To contact the attending provider between 7A-7P or the covering provider during after hours 7P-7A, please log into the web site [email or web address] and access using universal [place] password for that web site. If you do not have the password, please call the hospital operator.  01/30/2021, 6:44 PM

## 2021-01-30 NOTE — Telephone Encounter (Signed)
Patient Dismissed from [facility] effective 01/28/21 by Dr. Scarlette Shorts dismissal letter sent 1st class mail 01/30/21

## 2021-01-31 LAB — COMPREHENSIVE METABOLIC PANEL
ALT: 29 U/L (ref 0–44)
AST: 41 U/L (ref 15–41)
Albumin: 2.3 g/dL — ABNORMAL LOW (ref 3.5–5.0)
Alkaline Phosphatase: 56 U/L (ref 38–126)
Anion gap: 10 (ref 5–15)
BUN: 31 mg/dL — ABNORMAL HIGH (ref 8–23)
CO2: 28 mmol/L (ref 22–32)
Calcium: 8.1 mg/dL — ABNORMAL LOW (ref 8.9–10.3)
Chloride: 87 mmol/L — ABNORMAL LOW (ref 98–111)
Creatinine, Ser: 1.51 mg/dL — ABNORMAL HIGH (ref 0.61–1.24)
GFR, Estimated: 52 mL/min — ABNORMAL LOW (ref >60)
Glucose, Bld: 145 mg/dL — ABNORMAL HIGH (ref 70–99)
Potassium: 3.9 mmol/L (ref 3.5–5.1)
Sodium: 125 mmol/L — ABNORMAL LOW (ref 135–145)
Total Bilirubin: 0.6 mg/dL (ref 0.3–1.2)
Total Protein: 5.4 g/dL — ABNORMAL LOW (ref 6.5–8.1)

## 2021-01-31 LAB — CBC WITH DIFFERENTIAL/PLATELET
Abs Immature Granulocytes: 0.03 10*3/uL (ref 0.00–0.07)
Basophils Absolute: 0 10*3/uL (ref 0.0–0.1)
Basophils Relative: 1 %
Eosinophils Absolute: 0.1 10*3/uL (ref 0.0–0.5)
Eosinophils Relative: 1 %
HCT: 34.8 % — ABNORMAL LOW (ref 39.0–52.0)
Hemoglobin: 11.1 g/dL — ABNORMAL LOW (ref 13.0–17.0)
Immature Granulocytes: 1 %
Lymphocytes Relative: 5 %
Lymphs Abs: 0.3 10*3/uL — ABNORMAL LOW (ref 0.7–4.0)
MCH: 27.8 pg (ref 26.0–34.0)
MCHC: 31.9 g/dL (ref 30.0–36.0)
MCV: 87.2 fL (ref 80.0–100.0)
Monocytes Absolute: 1.4 10*3/uL — ABNORMAL HIGH (ref 0.1–1.0)
Monocytes Relative: 24 %
Neutro Abs: 4.2 10*3/uL (ref 1.7–7.7)
Neutrophils Relative %: 68 %
Platelets: 161 10*3/uL (ref 150–400)
RBC: 3.99 MIL/uL — ABNORMAL LOW (ref 4.22–5.81)
RDW: 16.5 % — ABNORMAL HIGH (ref 11.5–15.5)
WBC: 6.1 10*3/uL (ref 4.0–10.5)
nRBC: 0 % (ref 0.0–0.2)

## 2021-01-31 LAB — MAGNESIUM: Magnesium: 1.6 mg/dL — ABNORMAL LOW (ref 1.7–2.4)

## 2021-01-31 LAB — PHOSPHORUS: Phosphorus: 2.9 mg/dL (ref 2.5–4.6)

## 2021-01-31 LAB — GLUCOSE, CAPILLARY
Glucose-Capillary: 133 mg/dL — ABNORMAL HIGH (ref 70–99)
Glucose-Capillary: 160 mg/dL — ABNORMAL HIGH (ref 70–99)
Glucose-Capillary: 140 mg/dL — ABNORMAL HIGH (ref 70–99)
Glucose-Capillary: 135 mg/dL — ABNORMAL HIGH (ref 70–99)

## 2021-01-31 MED ORDER — OXYCODONE HCL 5 MG PO TABS
5.0000 mg | ORAL_TABLET | Freq: Four times a day (QID) | ORAL | Status: DC | PRN
Start: 2021-01-31 — End: 2021-02-02
  Administered 2021-01-31 – 2021-02-02 (×2): 5 mg via ORAL
  Filled 2021-01-31 (×2): qty 1

## 2021-01-31 MED ORDER — OXYCODONE HCL 5 MG PO TABS: 2.5000 mg | ORAL_TABLET | Freq: Four times a day (QID) | ORAL | Status: DC | PRN

## 2021-01-31 MED ORDER — POTASSIUM CHLORIDE CRYS ER 20 MEQ PO TBCR
40.0000 meq | EXTENDED_RELEASE_TABLET | Freq: Once | ORAL | Status: AC
  Administered 2021-01-31: 40 meq via ORAL
  Filled 2021-01-31: qty 2

## 2021-01-31 NOTE — Progress Notes (Signed)
OT Cancellation Note  Patient Details Name: Maxwell Lemen MRN: [id number] DOB: 06-Nov-1961   Cancelled Treatment:    Reason Eval/Treat Not Completed: Patient declined, no reason specified (pt declined, c/o of pain asking for meds. RN notified.) OT will continue to follow.   Minus Breeding, MSOT, OTR/L  [facility]  [phone]   Marius Ditch 01/31/2021, 1:27 PM

## 2021-01-31 NOTE — Progress Notes (Signed)
PROGRESS NOTE    Brad [name]  FKC:[id number] DOB: 05-Jul-1961 DOA: 01/27/2021 PCP: Charlott Rakes, MD   Chief Complaint  Patient presents with  . Fall   Brief Narrative:  Brad [name] 60 y.o.malewith medical history significant ofchronic hyponatremia, chronic systolic congestive heart failure, neuropathy, paroxysmal atrial fibrillation, EtOH abuse, history of cocaine and tobacco abuse, CKD stage IIIa, hepatitis C, cervical spinal stenosis who presented to the ED via EMS after being found on the floor by his roommate this afternoon. Patient apparently fell out of bed last night and had been on the floor since being found by roommate today. Patient states has been feeling weak over the last 3 to 4 days. Also reports his generalized edema has worsened and his home Lasix has been ineffective. Patient does not recall events entirely but denies loss of consciousness. No recent changes in his home medication regimen. No sick contacts. Reports he is unvaccinated against Covid-19. Also endorses headache where he fell and hit his head, denies visual changes, no chest pain, no palpitations, no fever/chills/night sweats, no nausea/vomiting/diarrhea, no abdominal pain, no paresthesias.  He's been admitted with hyponatremia 2/2 volume overload with HF exacerbation.  Assessment & Plan:   Principal Problem:   Hyponatremia Active Problems:   History of drug abuse (HCC)   Polysubstance abuse (HCC)   Chronic hepatitis C with cirrhosis ([place])   Essential hypertension   DM type 2 (diabetes mellitus, type 2) (HCC)   CKD (chronic kidney disease) stage 3, GFR 30-59 ml/min (HCC)   Acute on chronic combined systolic and diastolic CHF (congestive heart failure) (HCC)   Chronic atrial fibrillation   Alcohol dependence syndrome (HCC)   Chronic systolic CHF (congestive heart failure) (HCC)  Acute on chronic hyponatremia  Hypervolemic Hyponatremia Sodium level 116 on arrival, has history of  chronic hyponatremia with sodium level in the 120s-130s. Most recently sodium 121 on 01/15/2021. Etiology likely multifactorial with severe volume overload with poorly controlled CHF combined with continued EtOH abuse.  Urine osmolality 237, serum osmolality 246, urine sodium <10 (suggesting sodium avid state -> hypervolemia). -- Na 116 on presentation -- 125 today, gradually improving --lasix 40 IV BID  --Fluid restrict 1200 mL/day, low sodium diet - dietitian education --Demeclocycline 300mg  PO BID --continue to follow intermittent sodium  --goal correction no more than8-12 meq/L in Lubertha Leite 24h period. --Monitor on telemetry  Acute on chronic systolic congestive heart failure Anasarca with diffuse peripheral/body wall edema TTE 08/01/2020 with LVEF 30-35%, LV moderately decreased function with global hypokinesis, LV mildly dilated, mild LVH, RV systolic function mildly reduced, LA/RA moderately dilated, IVC dilated in size. Patient reports his home furosemide has been less effective over the past few weeks;and reports increasing swelling/weight gain. --Furosemide 40 mg IV BID (supposed to take 80 q am and 40 q PM - unclear if he's adherent with this regimen - 80 PO would be roughly equivalent to 40 IV he's getting here) --Strict I's and O's and daily weights --Closely monitor BMP daily  Mild rhabdomyolysis  Found Down  Facial Trauma Patient has been on the floor since last evening following fall. Elevated CK level of 405. Given patient's severe peripheral edema, will avoid any further IV fluids given his CHF history. --CK 405>270 -- CT head/neck/maxfac without acute injury/pathology - anterior cervical fusion from C4-C6 with scree and plate fixation.  Osseous ridging at C4-5 and C5-6 eccentric towards right with bilateral R foraminal narrowing, most severe at C5-6.  Lumbar plain films without acute fracture.  Hypokalemia --  replace and follow  --Repeat electrolytes in  [name].m.  Transaminitis AST 71, ALT 40. History of chronic hepatitis C, also with continued alcohol abuse, and likely hepatic congestion from volume overload. --Hold home statin --Follow CMP daily --telephone note, looks like he was discharged from GI practice - will need follow up  Type 2 diabetes mellitus Hemoglobin A1c 6.6. Farxiga 10 mg p.o. daily outpatient. --SSI for coverage --CBGs before every meal/at bedtime  Chronic atrial fibrillation Continues in atrial fibrillation on telemetry. Not on chronic anticoagulation due to fall history and continued alcohol abuse. --Continue to monitor on telemetry  AKI on CKD stage IIIa Creatinine 1.07 on admission, at baseline. --rising with diuresis, follow   Polysubstance abuse with EtOH, cocaine, heroin, tobacco UDS positive for opiates.EtOH level less than 10. Discussed with patient need for complete cessation of tobacco, alcohol and illicit drugs --TOC consult forsubstance abuse --CIWAA protocolwith symptom triggered Ativan --Multivitamin, thiamine, folic acid  Chronic hepatitis C Treatment nave. AST 71, ALT 40; consistent with continued alcohol abuse. --Outpatient follow-up with infectious disease  Recurrent falls secondary to EtOH abuse --PT/OT following; with home health recommendations; but patient does not know if roommates would allow  Cervical spine stenosiss/p ACDF 07/24/2020 No focal neurological findings on physical exam. --Outpatient follow-up  DVT prophylaxis: lovenox Code Status: full  Family Communication: none at bedsidde Disposition:   Status is: Inpatient  Remains inpatient appropriate because:Inpatient level of care appropriate due to severity of illness   Dispo:  Patient From: Home  Planned Disposition: Home  Expected discharge date: 02/01/2021  Medically stable for discharge: No         Consultants:   none  Procedures:   none  Antimicrobials:  Anti-infectives (From  admission, onward)   Start     Dose/Rate Route Frequency Ordered Stop   01/29/21 0900  demeclocycline (DECLOMYCIN) tablet 300 mg        300 mg Oral Every 12 hours 01/29/21 0819        Subjective: C/o pain to face  Objective: Vitals:   01/30/21 0547 01/30/21 2116 01/31/21 0521 01/31/21 1354  BP: 122/84 121/80 (!) 127/94 123/82  Pulse: 78 (!) 58 85 71  Resp: 14   16  Temp: 97.8 F (36.6 C) 98 F (36.7 C) (!) 97.5 F (36.4 C) 97.7 F (36.5 C)  TempSrc: Oral Oral Oral Oral  SpO2: 100% 100% 100% 94%  Weight: 86 kg     Height:        Intake/Output Summary (Last 24 hours) at 01/31/2021 1420 Last data filed at 01/31/2021 1138 Gross per 24 hour  Intake 420 ml  Output 1300 ml  Net -880 ml   Filed Weights   01/28/21 1955 01/29/21 0428 01/30/21 0547  Weight: 86.7 kg 86.7 kg 86 kg    Examination:  General: No acute distress. Bruising to R side of face Cardiovascular: Heart sounds show Belinda Bringhurst regular rate, and rhythm. Lungs: Clear to auscultation bilaterally  Abdomen: Soft, nontender, nondistended Neurological: Alert and oriented 3. Moves all extremities 4. Cranial nerves II through XII grossly intact. Skin: Warm and dry. No rashes or lesions. Extremities: dependent edema, LE edema bilaterally    Data Reviewed: I have personally reviewed following labs and imaging studies  CBC: Recent Labs  Lab 01/27/21 1554 01/31/21 0613  WBC 5.3 6.1  NEUTROABS 4.4 4.2  HGB 11.4* 11.1*  HCT 33.5* 34.8*  MCV 82.1 87.2  PLT 236 008    Basic Metabolic Panel: Recent Labs  Lab 01/28/21  1194 01/28/21 1345 01/29/21 0534 01/29/21 2117 01/30/21 0552 01/30/21 1335 01/31/21 0613  NA 118*   < > 117* 120* 121* 124* 125*  K 3.7  --  3.2*  --  3.2* 2.8* 3.9  CL 83*  --  82*  --  85* 85* 87*  CO2 25  --  26  --  25 28 28   GLUCOSE 110*  --  132*  --  136* 148* 145*  BUN 14  --  23  --  29* 30* 31*  CREATININE 1.15  --  1.46*  --  1.49* 1.47* 1.51*  CALCIUM 7.9*  --  7.9*  --  8.1* 8.1*  8.1*  MG 1.5*  --   --   --  1.8  --  1.6*  PHOS 2.7  --   --   --   --   --  2.9   < > = values in this interval not displayed.    GFR: Estimated Creatinine Clearance: 54.7 mL/min (Alven Alverio) (by C-G formula based on SCr of 1.51 mg/dL (H)).  Liver Function Tests: Recent Labs  Lab 01/27/21 1554 01/28/21 0511 01/29/21 0534 01/30/21 0552 01/31/21 0613  AST 71* 67* 47* 42* 41  ALT 40 39 33 31 29  ALKPHOS 73 65 68 60 56  BILITOT 1.6* 1.3* 0.7 0.5 0.6  PROT 6.4* 5.8* 5.6* 5.5* 5.4*  ALBUMIN 2.9* 2.6* 2.6* 2.5* 2.3*    CBG: Recent Labs  Lab 01/30/21 1213 01/30/21 1630 01/30/21 2109 01/31/21 0747 01/31/21 1128  GLUCAP 219* 77 178* 133* 160*     Recent Results (from the past 240 hour(s))  SARS CORONAVIRUS 2 (TAT 6-24 HRS) Nasopharyngeal Nasopharyngeal Swab     Status: None   Collection Time: 01/27/21  5:09 PM   Specimen: Nasopharyngeal Swab  Result Value Ref Range Status   SARS Coronavirus 2 NEGATIVE NEGATIVE Final    Comment: (NOTE) SARS-CoV-2 target nucleic acids are NOT DETECTED.  The SARS-CoV-2 RNA is generally detectable in upper and lower respiratory specimens during the acute phase of infection. Negative results do not preclude SARS-CoV-2 infection, do not rule out co-infections with other pathogens, and should not be used as the sole basis for treatment or other patient management decisions. Negative results must be combined with clinical observations, patient history, and epidemiological information. The expected result is Negative.  Fact Sheet for Patients: SugarRoll.be  Fact Sheet for Healthcare Providers: https://www.woods-mathews.com/  This test is not yet approved or cleared by the [place] FDA and  has been authorized for detection and/or diagnosis of SARS-CoV-2 by FDA under an Emergency Use Authorization (EUA). This EUA will remain  in effect (meaning this test can be used) for the duration of the COVID-19  declaration under Se ction 564(b)(1) of the Act, 21 U.S.C. section 360bbb-3(b)(1), unless the authorization is terminated or revoked sooner.  Performed at [facility], [place] [address], [place], [place] [address]          Radiology Studies: No results found.      Scheduled Meds: . demeclocycline  300 mg Oral Q12H  . enoxaparin (LOVENOX) injection  40 mg Subcutaneous QHS  . folic acid  1 mg Oral Daily  . furosemide  40 mg Intravenous BID  . insulin aspart  0-5 Units Subcutaneous QHS  . insulin aspart  0-9 Units Subcutaneous TID WC  . multivitamin with minerals  1 tablet Oral Daily  . thiamine  100 mg Oral Daily   Or  .  thiamine  100 mg Intravenous Daily   Continuous Infusions:   LOS: 4 days    Time spent: over 30 min    Fayrene Helper, MD [facility]   To contact the attending provider between 7A-7P or the covering provider during after hours 7P-7A, please log into the web site [email or web address] and access using universal [place] password for that web site. If you do not have the password, please call the hospital operator.  01/31/2021, 2:19 PM

## 2021-01-31 NOTE — Progress Notes (Signed)
Physical Therapy Treatment Patient Details Name: Brad [name] MRN: [id number] DOB: Dec 28, 1960 Today's Date: 01/31/2021    History of Present Illness 60 y.o. male with medical history significant of chronic hyponatremia, chronic systolic congestive heart failure, neuropathy, paroxysmal atrial fibrillation, EtOH abuse, history of cocaine and tobacco abuse, CKD stage IIIa, hepatitis C, cervical spinal stenosis who presented to the ED via EMS after being found on the floor by his roommate this afternoon.  Patient apparently fell out of bed last night and had been on the floor since being found by roommate today.  Patient states has been feeling weak over the last 3 to 4 days.  Also reports his generalized edema has worsened and his home Lasix has been ineffective.  Pt admitted for acute on chronic hyponatremia and acute on chronic systolic CHF    PT Comments    Pt ambulated 160' with RW with min/guard assist and frequent verbal cues to maintain proximity to walker. No loss of balance. Pt is easily distracted but responds well to redirection. Pt reports he feels like he's going through withdrawals.    Follow Up Recommendations  No PT follow up     Equipment Recommendations  Rolling walker with 5" wheels    Recommendations for Other Services       Precautions / Restrictions Precautions Precautions: Fall    Mobility  Bed Mobility Overal bed mobility: Needs Assistance       Supine to sit: Mod assist     General bed mobility comments: assist to raise trunk and pivot hips    Transfers Overall transfer level: Needs assistance Equipment used: Rolling walker (2 wheeled) Transfers: Sit to/from Stand Sit to Stand: Min assist         General transfer comment: [place] needed to power up from EOB  Ambulation/Gait Ambulation/Gait assistance: Min guard Gait Distance (Feet): 160 Feet Assistive device: Rolling walker (2 wheeled) Gait Pattern/deviations: Step-through  pattern;Decreased stride length Gait velocity: decr   General Gait Details: required frequent cues for maintaining proximity to RW, per prior notes pt reports poor vision -he has glasses but they are at home, no loss of balance, min/guard for safety   Stairs             Wheelchair Mobility    Modified [name] (Stroke Patients Only)       Balance Overall balance assessment: History of Falls;Needs assistance Sitting-balance support: Feet supported;Single extremity supported Sitting balance-Leahy Scale: Good       Standing balance-Leahy Scale: Poor Standing balance comment: Unsteady in standing. Needs at least unilateral support on RW for static standing and external assistance.                            Cognition Arousal/Alertness: Awake/alert Behavior During Therapy: WFL for tasks assessed/performed Overall Cognitive Status: No family/caregiver present to determine baseline cognitive functioning                                 General Comments: Pt is impulsive with decreased awareness of need of assistance. Responds well to cues. Pt reports he's going through withdrawals.      Exercises      General Comments        Pertinent Vitals/Pain Pain Score: 10-Worst pain ever Pain Location: face from fall Pain Descriptors / Indicators: Aching;Grimacing Pain Intervention(s): Limited activity within patient's tolerance;Monitored during session;Patient requesting pain meds-RN  notified    Home Living                      Prior Function            PT Goals (current goals can now be found in the care plan section)      Frequency    Min 3X/week      PT Plan Current plan remains appropriate    Co-evaluation              AM-PAC PT "6 Clicks" Mobility   Outcome Measure  Help needed turning from your back to your side while in a flat bed without using bedrails?: A Little Help needed moving from lying on your back to  sitting on the side of a flat bed without using bedrails?: A Little Help needed moving to and from a bed to a chair (including a wheelchair)?: A Little Help needed standing up from a chair using your arms (e.g., wheelchair or bedside chair)?: A Little Help needed to walk in hospital room?: A Little Help needed climbing 3-5 steps with a railing? : A Little 6 Click Score: 18    End of Session Equipment Utilized During Treatment: Gait belt Activity Tolerance: Patient tolerated treatment well Patient left: with call bell/phone within reach;in chair;with chair alarm set Nurse Communication: Mobility status (observed from nurses station) PT Visit Diagnosis: Difficulty in walking, not elsewhere classified (R26.2);History of falling (Z91.81)     Time: 5465-6812 PT Time Calculation (min) (ACUTE ONLY): 16 min  Charges:  $Gait Training: 8-22 mins                     Blondell Reveal Kistler PT 01/31/2021  Acute Rehabilitation Services Pager [phone] Office [phone]

## 2021-01-31 NOTE — Discharge Instructions (Signed)

## 2021-01-31 NOTE — Progress Notes (Signed)
Nutrition Education Note  RD consulted for nutrition education low sodium diet.  RD provided "Low Sodium Nutrition Therapy" handout from the Academy of [employer] Dietetics.   Will place handout in the Discharge Instructions.   Expect good compliance.  Body mass index is 26.44 kg/m. Pt meets criteria for overweight status based on current BMI.  Current diet order is Heart Healthy, patient is consuming mainly 100% of meals at this time. Labs and medications reviewed. No further nutrition interventions warranted at this time. RD contact information provided. If additional nutrition issues arise, please re-consult RD.        Jarome Matin, MS, RD, LDN, CNSC Inpatient Clinical Dietitian RD pager # available in [place]  After hours/weekend pager # available in [facility]

## 2021-01-31 NOTE — TOC Transition Note (Signed)
Transition of Care [facility]) - CM/SW Discharge Note   Patient Details  Name: Colbin Jovel MRN: [id number] Date of Birth: 05-16-1961  Transition of Care [facility]) CM/SW Contact:  Lenell Mcconnell, Marjie Skiff, RN Phone Number: 01/31/2021, 1:17 PM   Clinical Narrative:     Alcohol treatment resources placed on AVS.       Readmission Risk Interventions Readmission Risk Prevention Plan 08/17/2020 07/23/2020 06/13/2020  Transportation Screening Complete Complete Complete  PCP or Specialist Appt within 5-7 Days - - -  Home Care Screening - - -  Medication Review (RN CM) - - -  Medication Review (RN Transport planner) - Referral to Pharmacy Complete  PCP or Specialist appointment within 3-5 days of discharge Complete Complete Complete  PCP/Specialist Appt Not Complete comments - - -  [place] or Home Care Consult Complete Complete Complete  [place] or Home Care Consult Pt Refusal Comments - - -  SW Recovery Care/Counseling Consult Complete Complete Complete  Palliative Care Screening Not Applicable Not Applicable Not Applicable  Skilled Nursing Facility Not Applicable Complete Complete  Some recent data might be hidden

## 2021-02-01 LAB — GLUCOSE, CAPILLARY
Glucose-Capillary: 147 mg/dL — ABNORMAL HIGH (ref 70–99)
Glucose-Capillary: 150 mg/dL — ABNORMAL HIGH (ref 70–99)
Glucose-Capillary: 131 mg/dL — ABNORMAL HIGH (ref 70–99)
Glucose-Capillary: 133 mg/dL — ABNORMAL HIGH (ref 70–99)

## 2021-02-01 LAB — COMPREHENSIVE METABOLIC PANEL
ALT: 28 U/L (ref 0–44)
AST: 35 U/L (ref 15–41)
Albumin: 2.3 g/dL — ABNORMAL LOW (ref 3.5–5.0)
Alkaline Phosphatase: 57 U/L (ref 38–126)
Anion gap: 9 (ref 5–15)
BUN: 33 mg/dL — ABNORMAL HIGH (ref 8–23)
CO2: 28 mmol/L (ref 22–32)
Calcium: 8.1 mg/dL — ABNORMAL LOW (ref 8.9–10.3)
Chloride: 90 mmol/L — ABNORMAL LOW (ref 98–111)
Creatinine, Ser: 1.5 mg/dL — ABNORMAL HIGH (ref 0.61–1.24)
GFR, Estimated: 53 mL/min — ABNORMAL LOW (ref >60)
Glucose, Bld: 156 mg/dL — ABNORMAL HIGH (ref 70–99)
Potassium: 3.7 mmol/L (ref 3.5–5.1)
Sodium: 127 mmol/L — ABNORMAL LOW (ref 135–145)
Total Bilirubin: 0.5 mg/dL (ref 0.3–1.2)
Total Protein: 5.5 g/dL — ABNORMAL LOW (ref 6.5–8.1)

## 2021-02-01 LAB — CBC WITH DIFFERENTIAL/PLATELET
Abs Immature Granulocytes: 0.03 10*3/uL (ref 0.00–0.07)
Basophils Absolute: 0.1 10*3/uL (ref 0.0–0.1)
Basophils Relative: 1 %
Eosinophils Absolute: 0.1 10*3/uL (ref 0.0–0.5)
Eosinophils Relative: 1 %
HCT: 33.8 % — ABNORMAL LOW (ref 39.0–52.0)
Hemoglobin: 11 g/dL — ABNORMAL LOW (ref 13.0–17.0)
Immature Granulocytes: 1 %
Lymphocytes Relative: 6 %
Lymphs Abs: 0.3 10*3/uL — ABNORMAL LOW (ref 0.7–4.0)
MCH: 27.8 pg (ref 26.0–34.0)
MCHC: 32.5 g/dL (ref 30.0–36.0)
MCV: 85.6 fL (ref 80.0–100.0)
Monocytes Absolute: 1.1 10*3/uL — ABNORMAL HIGH (ref 0.1–1.0)
Monocytes Relative: 20 %
Neutro Abs: 3.7 10*3/uL (ref 1.7–7.7)
Neutrophils Relative %: 71 %
Platelets: 171 10*3/uL (ref 150–400)
RBC: 3.95 MIL/uL — ABNORMAL LOW (ref 4.22–5.81)
RDW: 16.6 % — ABNORMAL HIGH (ref 11.5–15.5)
WBC: 5.3 10*3/uL (ref 4.0–10.5)
nRBC: 0 % (ref 0.0–0.2)

## 2021-02-01 LAB — PHOSPHORUS: Phosphorus: 3.3 mg/dL (ref 2.5–4.6)

## 2021-02-01 LAB — MAGNESIUM: Magnesium: 1.4 mg/dL — ABNORMAL LOW (ref 1.7–2.4)

## 2021-02-01 MED ORDER — BUTALBITAL-APAP-CAFFEINE 50-325-40 MG PO TABS: 2.0000 | ORAL_TABLET | Freq: Once | ORAL | Status: DC | PRN

## 2021-02-01 MED ORDER — MAGNESIUM SULFATE 2 GM/50ML IV SOLN
2.0000 g | Freq: Once | INTRAVENOUS | Status: AC
  Administered 2021-02-01: 2 g via INTRAVENOUS
  Filled 2021-02-01: qty 50

## 2021-02-01 NOTE — Progress Notes (Signed)
OT Cancellation Note  Patient Details Name: Brad [name] MRN: [id number] DOB: Dec 22, 1961   Cancelled Treatment:    Reason Eval/Treat Not Completed: Patient declined, no reason specified. Pt refuses therapy. RN reports pt has stated to "not bother him" even with encouragement.  Minus Breeding, MSOT, OTR/L  Supplemental Rehabilitation Services  [phone]   Marius Ditch 02/01/2021, 1:59 PM

## 2021-02-01 NOTE — Progress Notes (Signed)
PROGRESS NOTE    Brad [name]  PYK:[id number] DOB: [birth year] DOA: 01/27/2021 PCP: Charlott Rakes, MD   Chief Complaint  Patient presents with  . Fall   Brief Narrative:  Brad Puryearis Brad [name] 60 y.o.malewith medical history significant ofchronic hyponatremia, chronic systolic congestive heart failure, neuropathy, paroxysmal atrial fibrillation, EtOH abuse, history of cocaine and tobacco abuse, CKD stage IIIa, hepatitis C, cervical spinal stenosis who presented to the ED via EMS after being found on the floor by his roommate this afternoon. Patient apparently fell out of bed last night and had been on the floor since being found by roommate today. Patient states has been feeling weak over the last 3 to 4 days. Also reports his generalized edema has worsened and his home Lasix has been ineffective. Patient does not recall events entirely but denies loss of consciousness. No recent changes in his home medication regimen. No sick contacts. Reports he is unvaccinated against Covid-19. Also endorses headache where he fell and hit his head, denies visual changes, no chest pain, no palpitations, no fever/chills/night sweats, no nausea/vomiting/diarrhea, no abdominal pain, no paresthesias.  He's been admitted with hyponatremia 2/2 volume overload with HF exacerbation.  Assessment & Plan:   Principal Problem:   Hyponatremia Active Problems:   History of drug abuse (HCC)   Polysubstance abuse (HCC)   Chronic hepatitis C with cirrhosis ([place])   Essential hypertension   DM type 2 (diabetes mellitus, type 2) (HCC)   CKD (chronic kidney disease) stage 3, GFR 30-59 ml/min (HCC)   Acute on chronic combined systolic and diastolic CHF (congestive heart failure) (HCC)   Chronic atrial fibrillation   Alcohol dependence syndrome (HCC)   Chronic systolic CHF (congestive heart failure) (HCC)  Acute on chronic hyponatremia  Hypervolemic Hyponatremia Sodium level 116 on arrival, has history of  chronic hyponatremia with sodium level in the 120s-130s. Most recently sodium 121 on 01/15/2021. Etiology likely multifactorial with severe volume overload with poorly controlled CHF combined with continued EtOH abuse.  Urine osmolality 237, serum osmolality 246, urine sodium <10 (suggesting sodium avid state -> hypervolemia). -- Na 116 on presentation --127 today, gradually improving --lasix 40 IV BID  --Fluid restrict 1200 mL/day, low sodium diet - dietitian education --Demeclocycline 300mg  PO BID --continue to follow intermittent sodium  --goal correction no more than8-12 meq/L in [place] 24h period. --Monitor on telemetry  Acute on chronic systolic congestive heart failure Anasarca with diffuse peripheral/body wall edema TTE 08/01/2020 with LVEF 30-35%, LV moderately decreased function with global hypokinesis, LV mildly dilated, mild LVH, RV systolic function mildly reduced, LA/RA moderately dilated, IVC dilated in size. Patient reports his home furosemide has been less effective over the past few weeks;and reports increasing swelling/weight gain. --Furosemide 40 mg IV BID (supposed to take 80 q am and 40 q PM - unclear if he's adherent with this regimen - 80 PO would be roughly equivalent to 40 IV he's getting here) --Strict I's and O's and daily weights --Closely monitor BMP daily  Mild rhabdomyolysis  Found Down  Facial Trauma Patient has been on the floor since last evening following fall. Elevated CK level of 405. Given patient's severe peripheral edema, will avoid any further IV fluids given his CHF history. --CK 405>270 -- CT head/neck/maxfac without acute injury/pathology - anterior cervical fusion from C4-C6 with scree and plate fixation.  Osseous ridging at C4-5 and C5-6 eccentric towards right with bilateral R foraminal narrowing, most severe at C5-6.  Lumbar plain films without acute fracture.  Hypokalemia  Hypomagnesemia --replace and follow  --Repeat electrolytes in  Victorio Creeden.m.  Transaminitis AST 71, ALT 40. History of chronic hepatitis C, also with continued alcohol abuse, and likely hepatic congestion from volume overload. --Hold home statin --Follow CMP daily --telephone note, looks like he was discharged from GI practice - will need follow up  Type 2 diabetes mellitus Hemoglobin A1c 6.6. Farxiga 10 mg p.o. daily outpatient. --SSI for coverage --CBGs before every meal/at bedtime  Chronic atrial fibrillation Continues in atrial fibrillation on telemetry. Not on chronic anticoagulation due to fall history and continued alcohol abuse. --Continue to monitor on telemetry  AKI on CKD stage IIIa Creatinine 1.07 on admission, at baseline. --relatively stable   Polysubstance abuse with EtOH, cocaine, heroin, tobacco UDS positive for opiates.EtOH level less than 10. Discussed with patient need for complete cessation of tobacco, alcohol and illicit drugs --TOC consult forsubstance abuse --CIWAA protocolwith symptom triggered Ativan --Multivitamin, thiamine, folic acid  Chronic hepatitis C Treatment nave. AST 71, ALT 40; consistent with continued alcohol abuse. --Outpatient follow-up with infectious disease  Recurrent falls secondary to EtOH abuse --PT/OT following; with home health recommendations; but patient does not know if roommates would allow  Cervical spine stenosiss/p ACDF 07/24/2020 No focal neurological findings on physical exam. --Outpatient follow-up  Headache Try fioricet  DVT prophylaxis: lovenox Code Status: full  Family Communication: none at bedsidde Disposition:   Status is: Inpatient  Remains inpatient appropriate because:Inpatient level of care appropriate due to severity of illness   Dispo:  Patient From: Home  Planned Disposition: Home  Expected discharge date: 02/03/2021  Medically stable for discharge: No         Consultants:   none  Procedures:   none  Antimicrobials:   Anti-infectives (From admission, onward)   Start     Dose/Rate Route Frequency Ordered Stop   01/29/21 0900  demeclocycline (DECLOMYCIN) tablet 300 mg        300 mg Oral Every 12 hours 01/29/21 0819        Subjective: C/o headache, not feeling well  Objective: Vitals:   01/31/21 1354 01/31/21 2138 02/01/21 0443 02/01/21 1349  BP: 123/82 140/85 (!) 144/77 103/63  Pulse: 71 80 81 62  Resp: 16   16  Temp: 97.7 F (36.5 C) 97.7 F (36.5 C) 98.7 F (37.1 C) 97.8 F (36.6 C)  TempSrc: Oral Oral Oral Oral  SpO2: 94% 94% 96% 92%  Weight:      Height:        Intake/Output Summary (Last 24 hours) at 02/01/2021 1425 Last data filed at 02/01/2021 1408 Gross per 24 hour  Intake 358 ml  Output 1800 ml  Net -1442 ml   Filed Weights   01/28/21 1955 01/29/21 0428 01/30/21 0547  Weight: 86.7 kg 86.7 kg 86 kg    Examination:  General: No acute distress. Cardiovascular: RRR Lungs: unlabored Abdomen: Soft, nontender, nondistended  Neurological: Alert and oriented 3. Moves all extremities 4 . Cranial nerves II through XII grossly intact. Skin: Warm and dry. No rashes or lesions. Extremities: No clubbing or cyanosis. Bilateral LE edema.     Data Reviewed: I have personally reviewed following labs and imaging studies  CBC: Recent Labs  Lab 01/27/21 1554 01/31/21 0613 02/01/21 0602  WBC 5.3 6.1 5.3  NEUTROABS 4.4 4.2 3.7  HGB 11.4* 11.1* 11.0*  HCT 33.5* 34.8* 33.8*  MCV 82.1 87.2 85.6  PLT 236 161 510    Basic Metabolic Panel: Recent Labs  Lab 01/28/21 0511 01/28/21 1345  01/29/21 0534 01/29/21 2117 01/30/21 0552 01/30/21 1335 01/31/21 0613 02/01/21 0602  NA 118*   < > 117* 120* 121* 124* 125* 127*  K 3.7  --  3.2*  --  3.2* 2.8* 3.9 3.7  CL 83*  --  82*  --  85* 85* 87* 90*  CO2 25  --  26  --  25 28 28 28   GLUCOSE 110*  --  132*  --  136* 148* 145* 156*  BUN 14  --  23  --  29* 30* 31* 33*  CREATININE 1.15  --  1.46*  --  1.49* 1.47* 1.51* 1.50*   CALCIUM 7.9*  --  7.9*  --  8.1* 8.1* 8.1* 8.1*  MG 1.5*  --   --   --  1.8  --  1.6* 1.4*  PHOS 2.7  --   --   --   --   --  2.9 3.3   < > = values in this interval not displayed.    GFR: Estimated Creatinine Clearance: 55.1 mL/min (Marlyce Mcdougald) (by C-G formula based on SCr of 1.5 mg/dL (H)).  Liver Function Tests: Recent Labs  Lab 01/28/21 0511 01/29/21 0534 01/30/21 0552 01/31/21 0613 02/01/21 0602  AST 67* 47* 42* 41 35  ALT 39 33 31 29 28   ALKPHOS 65 68 60 56 57  BILITOT 1.3* 0.7 0.5 0.6 0.5  PROT 5.8* 5.6* 5.5* 5.4* 5.5*  ALBUMIN 2.6* 2.6* 2.5* 2.3* 2.3*    CBG: Recent Labs  Lab 01/31/21 1128 01/31/21 1652 01/31/21 2134 02/01/21 0801 02/01/21 1211  GLUCAP 160* 140* 135* 147* 150*     Recent Results (from the past 240 hour(s))  SARS CORONAVIRUS 2 (TAT 6-24 HRS) Nasopharyngeal Nasopharyngeal Swab     Status: None   Collection Time: 01/27/21  5:09 PM   Specimen: Nasopharyngeal Swab  Result Value Ref Range Status   SARS Coronavirus 2 NEGATIVE NEGATIVE Final    Comment: (NOTE) SARS-CoV-2 target nucleic acids are NOT DETECTED.  The SARS-CoV-2 RNA is generally detectable in upper and lower respiratory specimens during the acute phase of infection. Negative results do not preclude SARS-CoV-2 infection, do not rule out co-infections with other pathogens, and should not be used as the sole basis for treatment or other patient management decisions. Negative results must be combined with clinical observations, patient history, and epidemiological information. The expected result is Negative.  Fact Sheet for Patients: SugarRoll.be  Fact Sheet for Healthcare Providers: https://www.woods-mathews.com/  This test is not yet approved or cleared by the [place] FDA and  has been authorized for detection and/or diagnosis of SARS-CoV-2 by FDA under an Emergency Use Authorization (EUA). This EUA will remain  in effect (meaning this  test can be used) for the duration of the COVID-19 declaration under Se ction 564(b)(1) of the Act, 21 U.S.C. section 360bbb-3(b)(1), unless the authorization is terminated or revoked sooner.  Performed at [facility], [employer] [address], [place], [place] [address]          Radiology Studies: No results found.      Scheduled Meds: . demeclocycline  300 mg Oral Q12H  . enoxaparin (LOVENOX) injection  40 mg Subcutaneous QHS  . folic acid  1 mg Oral Daily  . furosemide  40 mg Intravenous BID  . insulin aspart  0-5 Units Subcutaneous QHS  . insulin aspart  0-9 Units Subcutaneous TID WC  . multivitamin with minerals  1 tablet Oral Daily  . thiamine  100 mg Oral Daily   Or  . thiamine  100 mg Intravenous Daily   Continuous Infusions:   LOS: 5 days    Time spent: over 30 min    Fayrene Helper, MD [facility]   To contact the attending provider between 7A-7P or the covering provider during after hours 7P-7A, please log into the web site [email or web address] and access using universal [place] password for that web site. If you do not have the password, please call the hospital operator.  02/01/2021, 2:25 PM

## 2021-02-02 LAB — COMPREHENSIVE METABOLIC PANEL
ALT: 25 U/L (ref 0–44)
AST: 31 U/L (ref 15–41)
Albumin: 2.2 g/dL — ABNORMAL LOW (ref 3.5–5.0)
Alkaline Phosphatase: 52 U/L (ref 38–126)
Anion gap: 11 (ref 5–15)
BUN: 34 mg/dL — ABNORMAL HIGH (ref 8–23)
CO2: 29 mmol/L (ref 22–32)
Calcium: 8.1 mg/dL — ABNORMAL LOW (ref 8.9–10.3)
Chloride: 91 mmol/L — ABNORMAL LOW (ref 98–111)
Creatinine, Ser: 1.52 mg/dL — ABNORMAL HIGH (ref 0.61–1.24)
GFR, Estimated: 52 mL/min — ABNORMAL LOW (ref >60)
Glucose, Bld: 141 mg/dL — ABNORMAL HIGH (ref 70–99)
Potassium: 3.5 mmol/L (ref 3.5–5.1)
Sodium: 131 mmol/L — ABNORMAL LOW (ref 135–145)
Total Bilirubin: 0.7 mg/dL (ref 0.3–1.2)
Total Protein: 5.2 g/dL — ABNORMAL LOW (ref 6.5–8.1)

## 2021-02-02 LAB — CBC WITH DIFFERENTIAL/PLATELET
Abs Immature Granulocytes: 0.01 10*3/uL (ref 0.00–0.07)
Basophils Absolute: 0 10*3/uL (ref 0.0–0.1)
Basophils Relative: 1 %
Eosinophils Absolute: 0.1 10*3/uL (ref 0.0–0.5)
Eosinophils Relative: 2 %
HCT: 31.7 % — ABNORMAL LOW (ref 39.0–52.0)
Hemoglobin: 10.5 g/dL — ABNORMAL LOW (ref 13.0–17.0)
Immature Granulocytes: 0 %
Lymphocytes Relative: 7 %
Lymphs Abs: 0.3 10*3/uL — ABNORMAL LOW (ref 0.7–4.0)
MCH: 28.5 pg (ref 26.0–34.0)
MCHC: 33.1 g/dL (ref 30.0–36.0)
MCV: 85.9 fL (ref 80.0–100.0)
Monocytes Absolute: 0.9 10*3/uL (ref 0.1–1.0)
Monocytes Relative: 22 %
Neutro Abs: 2.7 10*3/uL (ref 1.7–7.7)
Neutrophils Relative %: 68 %
Platelets: 169 10*3/uL (ref 150–400)
RBC: 3.69 MIL/uL — ABNORMAL LOW (ref 4.22–5.81)
RDW: 16.7 % — ABNORMAL HIGH (ref 11.5–15.5)
WBC: 4 10*3/uL (ref 4.0–10.5)
nRBC: 0 % (ref 0.0–0.2)

## 2021-02-02 LAB — MAGNESIUM: Magnesium: 1.6 mg/dL — ABNORMAL LOW (ref 1.7–2.4)

## 2021-02-02 LAB — GLUCOSE, CAPILLARY
Glucose-Capillary: 161 mg/dL — ABNORMAL HIGH (ref 70–99)
Glucose-Capillary: 148 mg/dL — ABNORMAL HIGH (ref 70–99)
Glucose-Capillary: 141 mg/dL — ABNORMAL HIGH (ref 70–99)
Glucose-Capillary: 194 mg/dL — ABNORMAL HIGH (ref 70–99)

## 2021-02-02 LAB — PHOSPHORUS: Phosphorus: 3.6 mg/dL (ref 2.5–4.6)

## 2021-02-02 MED ORDER — OXYCODONE HCL 5 MG PO TABS
10.0000 mg | ORAL_TABLET | ORAL | Status: DC | PRN
  Administered 2021-02-02 – 2021-02-03 (×4): 10 mg via ORAL
  Filled 2021-02-02 (×4): qty 2

## 2021-02-02 MED ORDER — FUROSEMIDE 40 MG PO TABS
40.0000 mg | ORAL_TABLET | ORAL | Status: DC
Start: 2021-02-02 — End: 2021-02-02

## 2021-02-02 MED ORDER — FUROSEMIDE 40 MG PO TABS
80.0000 mg | ORAL_TABLET | Freq: Every day | ORAL | Status: DC
  Administered 2021-02-03: 80 mg via ORAL
  Filled 2021-02-02: qty 2

## 2021-02-02 MED ORDER — FUROSEMIDE 40 MG PO TABS
40.0000 mg | ORAL_TABLET | Freq: Every day | ORAL | Status: DC
  Administered 2021-02-02: 40 mg via ORAL
  Filled 2021-02-02: qty 1

## 2021-02-02 MED ORDER — OXYCODONE HCL 5 MG PO TABS
5.0000 mg | ORAL_TABLET | ORAL | Status: DC | PRN
  Administered 2021-02-02: 5 mg via ORAL
  Filled 2021-02-02: qty 1

## 2021-02-02 MED ORDER — MAGNESIUM SULFATE 2 GM/50ML IV SOLN
2.0000 g | Freq: Once | INTRAVENOUS | Status: AC
  Administered 2021-02-02: 2 g via INTRAVENOUS
  Filled 2021-02-02: qty 50

## 2021-02-02 NOTE — Progress Notes (Signed)
PROGRESS NOTE    Brad [name]  OIZ:[id number] DOB: [birth year] DOA: 01/27/2021 PCP: Charlott Rakes, MD   Chief Complaint  Patient presents with  . Fall   Brief Narrative:  Brad [name] 60 y.o.malewith medical history significant ofchronic hyponatremia, chronic systolic congestive heart failure, neuropathy, paroxysmal atrial fibrillation, EtOH abuse, history of cocaine and tobacco abuse, CKD stage IIIa, hepatitis C, cervical spinal stenosis who presented to the ED via EMS after being found on the floor by his roommate this afternoon. Patient apparently fell out of bed last night and had been on the floor since being found by roommate today. Patient states has been feeling weak over the last 3 to 4 days. Also reports his generalized edema has worsened and his home Lasix has been ineffective. Patient does not recall events entirely but denies loss of consciousness. No recent changes in his home medication regimen. No sick contacts. Reports he is unvaccinated against Covid-19. Also endorses headache where he fell and hit his head, denies visual changes, no chest pain, no palpitations, no fever/chills/night sweats, no nausea/vomiting/diarrhea, no abdominal pain, no paresthesias.  He's been admitted with hyponatremia 2/2 volume overload with HF exacerbation.  Assessment & Plan:   Principal Problem:   Hyponatremia Active Problems:   History of drug abuse (HCC)   Polysubstance abuse (HCC)   Chronic hepatitis C with cirrhosis ([place])   Essential hypertension   DM type 2 (diabetes mellitus, type 2) (HCC)   CKD (chronic kidney disease) stage 3, GFR 30-59 ml/min (HCC)   Acute on chronic combined systolic and diastolic CHF (congestive heart failure) (HCC)   Chronic atrial fibrillation   Alcohol dependence syndrome (HCC)   Chronic systolic CHF (congestive heart failure) (HCC)  Acute on chronic hyponatremia  Hypervolemic Hyponatremia Sodium level 116 on arrival, has history of  chronic hyponatremia with sodium level in the 120s-130s. Most recently sodium 121 on 01/15/2021. Etiology likely multifactorial with severe volume overload with poorly controlled CHF combined with continued EtOH abuse.  Urine osmolality 237, serum osmolality 246, urine sodium <10 (suggesting sodium avid state -> hypervolemia). -- Na 116 on presentation --131 today, gradually improving --lasix 40 IV BID -> switch to PO regimen today (40 IV would be equivalent to the 80 PO he typically takes in the morning, follow tomorrow) --Fluid restrict 1200 mL/day, low sodium diet - dietitian education --Demeclocycline 300mg  PO BID --continue to follow intermittent sodium  --goal correction no more than8-12 meq/L in Brad [name] 24h period. --Monitor on telemetry  Acute on chronic systolic congestive heart failure Anasarca with diffuse peripheral/body wall edema TTE 08/01/2020 with LVEF 30-35%, LV moderately decreased function with global hypokinesis, LV mildly dilated, mild LVH, RV systolic function mildly reduced, LA/RA moderately dilated, IVC dilated in size. Patient reports his home furosemide has been less effective over the past few weeks;and reports increasing swelling/weight gain. --Furosemide 40 mg IV BID -> resume home regimen (supposed to take 80 q am and 40 q PM - unclear if he's adherent with this regimen - 80 PO would be roughly equivalent to 40 IV he's getting here) --Strict I's and O's and daily weights --Closely monitor BMP daily  Mild rhabdomyolysis  Found Down  Facial Trauma Patient has been on the floor since last evening following fall. Elevated CK level of 405. Given patient's severe peripheral edema, will avoid any further IV fluids given his CHF history. --CK 405>270 -- CT head/neck/maxfac without acute injury/pathology - anterior cervical fusion from C4-C6 with scree and plate fixation.  Osseous ridging  at C4-5 and C5-6 eccentric towards right with bilateral R foraminal narrowing, most  severe at C5-6.  Lumbar plain films without acute fracture.  Hypokalemia  Hypomagnesemia --replace and follow  --Repeat electrolytes in [name].m.  Transaminitis AST 71, ALT 40. History of chronic hepatitis C, also with continued alcohol abuse, and likely hepatic congestion from volume overload. --Hold home statin --Follow CMP daily --telephone note, looks like he was discharged from GI practice - will need follow up  Type 2 diabetes mellitus Hemoglobin A1c 6.6. Farxiga 10 mg p.o. daily outpatient. --SSI for coverage --CBGs before every meal/at bedtime  Chronic atrial fibrillation Continues in atrial fibrillation on telemetry. Not on chronic anticoagulation due to fall history and continued alcohol abuse. --Continue to monitor on telemetry  AKI on CKD stage IIIa Creatinine 1.07 on admission, at baseline. --relatively stable   Polysubstance abuse with EtOH, cocaine, heroin, tobacco UDS positive for opiates.EtOH level less than 10. Discussed with patient need for complete cessation of tobacco, alcohol and illicit drugs --TOC consult forsubstance abuse --CIWAA protocolwith symptom triggered Ativan --Multivitamin, thiamine, folic acid  Chronic hepatitis C Treatment nave. AST 71, ALT 40; consistent with continued alcohol abuse. --Outpatient follow-up with infectious disease  Recurrent falls secondary to EtOH abuse --PT/OT following; with home health recommendations; but patient does not know if roommates would allow  Cervical spine stenosiss/p ACDF 07/24/2020 No focal neurological findings on physical exam. --Outpatient follow-up  Headache Try fioricet  DVT prophylaxis: lovenox Code Status: full  Family Communication: none at bedsidde Disposition:   Status is: Inpatient  Remains inpatient appropriate because:Inpatient level of care appropriate due to severity of illness   Dispo:  Patient From: Home  Planned Disposition: Home  Expected discharge  date: 02/03/2021  Medically stable for discharge: No         Consultants:   none  Procedures:   none  Antimicrobials:  Anti-infectives (From admission, onward)   Start     Dose/Rate Route Frequency Ordered Stop   01/29/21 0900  demeclocycline (DECLOMYCIN) tablet 300 mg        300 mg Oral Every 12 hours 01/29/21 0819        Subjective: Says yesterday he was feeling down because his son passed yesterday Zakiah Beckerman cpl years ago Better spirits today Appreciative of our care  Objective: Vitals:   02/01/21 1349 02/01/21 2235 02/02/21 0557 02/02/21 1249  BP: 103/63 112/88 131/90 114/71  Pulse: 62 74 60 70  Resp: 16 17  18   Temp: 97.8 F (36.6 C) 97.8 F (36.6 C) 98.1 F (36.7 C) (!) 97.4 F (36.3 C)  TempSrc: Oral Oral Oral Oral  SpO2: 92% 100% 93% 96%  Weight:      Height:        Intake/Output Summary (Last 24 hours) at 02/02/2021 1757 Last data filed at 02/02/2021 1700 Gross per 24 hour  Intake --  Output 400 ml  Net -400 ml   Filed Weights   01/28/21 1955 01/29/21 0428 01/30/21 0547  Weight: 86.7 kg 86.7 kg 86 kg    Examination:  General: No acute distress. Cardiovascular: RRR Lungs: unlabored Abdomen: Soft, nontender, nondistended. Neurological: Alert and oriented 3. Moves all extremities 4 . Cranial nerves II through XII grossly intact. Skin: Warm and dry. No rashes or lesions. Extremities: No clubbing or cyanosis. No edema.    Data Reviewed: I have personally reviewed following labs and imaging studies  CBC: Recent Labs  Lab 01/27/21 1554 01/31/21 0613 02/01/21 0602 02/02/21 0644  WBC 5.3  6.1 5.3 4.0  NEUTROABS 4.4 4.2 3.7 2.7  HGB 11.4* 11.1* 11.0* 10.5*  HCT 33.5* 34.8* 33.8* 31.7*  MCV 82.1 87.2 85.6 85.9  PLT 236 161 171 803    Basic Metabolic Panel: Recent Labs  Lab 01/28/21 0511 01/28/21 1345 01/30/21 0552 01/30/21 1335 01/31/21 0613 02/01/21 0602 02/02/21 0644  NA 118*   < > 121* 124* 125* 127* 131*  K 3.7   < > 3.2* 2.8*  3.9 3.7 3.5  CL 83*   < > 85* 85* 87* 90* 91*  CO2 25   < > 25 28 28 28 29   GLUCOSE 110*   < > 136* 148* 145* 156* 141*  BUN 14   < > 29* 30* 31* 33* 34*  CREATININE 1.15   < > 1.49* 1.47* 1.51* 1.50* 1.52*  CALCIUM 7.9*   < > 8.1* 8.1* 8.1* 8.1* 8.1*  MG 1.5*  --  1.8  --  1.6* 1.4* 1.6*  PHOS 2.7  --   --   --  2.9 3.3 3.6   < > = values in this interval not displayed.    GFR: Estimated Creatinine Clearance: 54.4 mL/min (Jhonny Calixto) (by C-G formula based on SCr of 1.52 mg/dL (H)).  Liver Function Tests: Recent Labs  Lab 01/29/21 0534 01/30/21 0552 01/31/21 0613 02/01/21 0602 02/02/21 0644  AST 47* 42* 41 35 31  ALT 33 31 29 28 25   ALKPHOS 68 60 56 57 52  BILITOT 0.7 0.5 0.6 0.5 0.7  PROT 5.6* 5.5* 5.4* 5.5* 5.2*  ALBUMIN 2.6* 2.5* 2.3* 2.3* 2.2*    CBG: Recent Labs  Lab 02/01/21 1618 02/01/21 2147 02/02/21 0752 02/02/21 1200 02/02/21 1644  GLUCAP 131* 133* 161* 148* 141*     Recent Results (from the past 240 hour(s))  SARS CORONAVIRUS 2 (TAT 6-24 HRS) Nasopharyngeal Nasopharyngeal Swab     Status: None   Collection Time: 01/27/21  5:09 PM   Specimen: Nasopharyngeal Swab  Result Value Ref Range Status   SARS Coronavirus 2 NEGATIVE NEGATIVE Final    Comment: (NOTE) SARS-CoV-2 target nucleic acids are NOT DETECTED.  The SARS-CoV-2 RNA is generally detectable in upper and lower respiratory specimens during the acute phase of infection. Negative results do not preclude SARS-CoV-2 infection, do not rule out co-infections with other pathogens, and should not be used as the sole basis for treatment or other patient management decisions. Negative results must be combined with clinical observations, patient history, and epidemiological information. The expected result is Negative.  Fact Sheet for Patients: SugarRoll.be  Fact Sheet for Healthcare Providers: https://www.woods-mathews.com/  This test is not yet approved or cleared  by the [place] FDA and  has been authorized for detection and/or diagnosis of SARS-CoV-2 by FDA under an Emergency Use Authorization (EUA). This EUA will remain  in effect (meaning this test can be used) for the duration of the COVID-19 declaration under Se ction 564(b)(1) of the Act, 21 U.S.C. section 360bbb-3(b)(1), unless the authorization is terminated or revoked sooner.  Performed at [facility], [place] [address] [place], [place] [address]          Radiology Studies: No results found.      Scheduled Meds: . demeclocycline  300 mg Oral Q12H  . enoxaparin (LOVENOX) injection  40 mg Subcutaneous QHS  . folic acid  1 mg Oral Daily  . furosemide  40 mg Oral q1800  . [START ON 02/03/2021] furosemide  80 mg Oral QAC breakfast  .  insulin aspart  0-5 Units Subcutaneous QHS  . insulin aspart  0-9 Units Subcutaneous TID WC  . multivitamin with minerals  1 tablet Oral Daily  . thiamine  100 mg Oral Daily   Or  . thiamine  100 mg Intravenous Daily   Continuous Infusions:   LOS: 6 days    Time spent: over 30 min    Fayrene Helper, MD [facility]   To contact the attending provider between 7A-7P or the covering provider during after hours 7P-7A, please log into the web site [email or web address] and access using universal [place] password for that web site. If you do not have the password, please call the hospital operator.  02/02/2021, 5:57 PM

## 2021-02-03 LAB — CBC WITH DIFFERENTIAL/PLATELET
Abs Immature Granulocytes: 0.01 10*3/uL (ref 0.00–0.07)
Basophils Absolute: 0 10*3/uL (ref 0.0–0.1)
Basophils Relative: 1 %
Eosinophils Absolute: 0 10*3/uL (ref 0.0–0.5)
Eosinophils Relative: 1 %
HCT: 31.6 % — ABNORMAL LOW (ref 39.0–52.0)
Hemoglobin: 10.5 g/dL — ABNORMAL LOW (ref 13.0–17.0)
Immature Granulocytes: 0 %
Lymphocytes Relative: 6 %
Lymphs Abs: 0.3 10*3/uL — ABNORMAL LOW (ref 0.7–4.0)
MCH: 27.9 pg (ref 26.0–34.0)
MCHC: 33.2 g/dL (ref 30.0–36.0)
MCV: 83.8 fL (ref 80.0–100.0)
Monocytes Absolute: 1 10*3/uL (ref 0.1–1.0)
Monocytes Relative: 22 %
Neutro Abs: 3.3 10*3/uL (ref 1.7–7.7)
Neutrophils Relative %: 70 %
Platelets: 174 10*3/uL (ref 150–400)
RBC: 3.77 MIL/uL — ABNORMAL LOW (ref 4.22–5.81)
RDW: 16.3 % — ABNORMAL HIGH (ref 11.5–15.5)
WBC: 4.7 10*3/uL (ref 4.0–10.5)
nRBC: 0 % (ref 0.0–0.2)

## 2021-02-03 LAB — GLUCOSE, CAPILLARY
Glucose-Capillary: 124 mg/dL — ABNORMAL HIGH (ref 70–99)
Glucose-Capillary: 211 mg/dL — ABNORMAL HIGH (ref 70–99)

## 2021-02-03 LAB — COMPREHENSIVE METABOLIC PANEL
ALT: 24 U/L (ref 0–44)
AST: 29 U/L (ref 15–41)
Albumin: 2.2 g/dL — ABNORMAL LOW (ref 3.5–5.0)
Alkaline Phosphatase: 49 U/L (ref 38–126)
Anion gap: 10 (ref 5–15)
BUN: 34 mg/dL — ABNORMAL HIGH (ref 8–23)
CO2: 30 mmol/L (ref 22–32)
Calcium: 8.2 mg/dL — ABNORMAL LOW (ref 8.9–10.3)
Chloride: 90 mmol/L — ABNORMAL LOW (ref 98–111)
Creatinine, Ser: 1.45 mg/dL — ABNORMAL HIGH (ref 0.61–1.24)
GFR, Estimated: 55 mL/min — ABNORMAL LOW (ref >60)
Glucose, Bld: 147 mg/dL — ABNORMAL HIGH (ref 70–99)
Potassium: 3.3 mmol/L — ABNORMAL LOW (ref 3.5–5.1)
Sodium: 130 mmol/L — ABNORMAL LOW (ref 135–145)
Total Bilirubin: 0.8 mg/dL (ref 0.3–1.2)
Total Protein: 5.2 g/dL — ABNORMAL LOW (ref 6.5–8.1)

## 2021-02-03 LAB — MAGNESIUM: Magnesium: 1.7 mg/dL (ref 1.7–2.4)

## 2021-02-03 LAB — PHOSPHORUS: Phosphorus: 4.2 mg/dL (ref 2.5–4.6)

## 2021-02-03 MED ORDER — FUROSEMIDE 40 MG PO TABS: ORAL_TABLET | ORAL | 1 refills | Status: AC

## 2021-02-03 NOTE — Discharge Summary (Signed)
Physician Discharge Summary  Taber Sweetser ONG:[id number] DOB: Apr 30, 1963 DOA: 01/27/2021  PCP: Charlott Rakes, MD  Admit date: 01/27/2021 Discharge date: 02/03/2021  Time spent: 40 minutes  Recommendations for Outpatient Follow-up:  1. Follow outpatient CBC/CMP within 1 week 2. Follow up with gastroenterology outpatient (looks like he's been dismissed from [facility], follow up with PCP, will need to establish with new PCP) 3. Follow up with ID outpatient for hepatitis C infection 4. Follow with cardiology outpatient for HFrEF - continue to optimize HF regimen  Discharge Diagnoses:  Principal Problem:   Hyponatremia Active Problems:   History of drug abuse ([place])   Polysubstance abuse ([place])   Chronic hepatitis C with cirrhosis ([place])   Essential hypertension   DM type 2 (diabetes mellitus, type 2) (HCC)   CKD (chronic kidney disease) stage 3, GFR 30-59 ml/min (HCC)   Acute on chronic combined systolic and diastolic CHF (congestive heart failure) (HCC)   Chronic atrial fibrillation   Alcohol dependence syndrome (HCC)   Chronic systolic CHF (congestive heart failure) ([place])   Discharge Condition: stable  Diet recommendation: low sodium/diabetes  Filed Weights   01/28/21 1955 01/29/21 0428 01/30/21 0547  Weight: 86.7 kg 86.7 kg 86 kg    History of present illness:  Brad [name] a 59 y.o.malewith medical history significant ofchronic hyponatremia, chronic systolic congestive heart failure, neuropathy, paroxysmal atrial fibrillation, EtOH abuse, history of cocaine and tobacco abuse, CKD stage IIIa, hepatitis C, cervical spinal stenosis who presented to the ED via EMS after being found on the floor by his roommate this afternoon. Patient apparently fell out of bed last night and had been on the floor since being found by roommate today. Patient states has been feeling weak over the last 3 to 4 days. Also reports his generalized edema has worsened and his home Lasix has been  ineffective. Patient does not recall events entirely but denies loss of consciousness. No recent changes in his home medication regimen. No sick contacts. Reports he is unvaccinated against Covid-19. Also endorses headache where he fell and hit his head, denies visual changes, no chest pain, no palpitations, no fever/chills/night sweats, no nausea/vomiting/diarrhea, no abdominal pain, no paresthesias.  He's been admitted with hyponatremia 2/2 volume overload with HF exacerbation.  He's improved with IV lasix.  He's improved and is stable for discharge on 2/13.  See below for additional details  Hospital Course:  Acute on chronic hyponatremia  Hypervolemic Hyponatremia Sodium level 116 on arrival, has history of chronic hyponatremia with sodium level in the 120s-130s. Most recently sodium 121 on 01/15/2021. Etiology likely multifactorial with severe volume overload with poorly controlled CHF combined with continued EtOH abuse. Urine osmolality 237, serum osmolality 246,urine sodium<10 (suggesting sodium avid state -> hypervolemia). -- Na 116 on presentation --130 today, relatively stable on what would be equivalent oral regimen x24 hrs --d/c on lasix 80 mg qAM and 40 mg qPM --Fluid restrict 1200 mL/day, low sodium diet - dietitian education --Demeclocycline 310m PO BID given during admission, though I think with diagnosis of hypervolemic hyponatremia, can d/c without this. --continue to follow intermittent sodium  --goal correction no more than8-12 meq/L in a 24h period. --Monitor on telemetry  Acute on chronic systolic congestive heart failure Anasarca with diffuse peripheral/body wall edema TTE 08/01/2020 with LVEF 30-35%, LV moderately decreased function with global hypokinesis, LV mildly dilated, mild LVH, RV systolic function mildly reduced, LA/RA moderately dilated, IVC dilated in size. Patient reports his home furosemide has been less effective over the  past few weeks;and  reports increasing swelling/weight gain. --resume home lasix.  Bidil.  Not on beta blocker due to hx bradycardia.  Not on ARB/ACEi due to CKD, though could consider trial in future depending on repeat labs.  Needs cardiology follow up to optimize regimen. --Strict I's and O's and daily weights --Closely monitor BMP daily  Mild rhabdomyolysis  Found Down  Facial Trauma Patient has been on the floor since last evening following fall. Elevated CK level of 405. Given patient's severe peripheral edema, will avoid any further IV fluids given his CHF history. --CK 405>270 -- CT head/neck/maxfac without acute injury/pathology - anterior cervical fusion from C4-C6 with scree and plate fixation.  Osseous ridging at C4-5 and C5-6 eccentric towards right with bilateral R foraminal narrowing, most severe at C5-6.  Lumbar plain films without acute fracture.  Hypokalemia  Hypomagnesemia --replace  and follow  --Repeat electrolytes in a.m.  Transaminitis AST 71, ALT 40. History of chronic hepatitis C, also with continued alcohol abuse, and likely hepatic congestion from volume overload. --Hold home statin --Follow CMP daily --telephone note, looks like he was discharged from GI practice - will need follow up - follow up with PCP to discuss  Type 2 diabetes mellitus Hemoglobin A1c 6.6. Farxiga 10 mg p.o. daily outpatient. --SSI for coverage --CBGs before every meal/at bedtime  Chronic atrial fibrillation Continues in atrial fibrillation on telemetry. Not on chronic anticoagulation due to fall history and continued alcohol abuse. -- continue amiodarone at discharge, follow - consider d/c given LFT abnormalities/hep C  --Continue to monitor on telemetry  AKI on CKD stage IIIa Creatinine 1.07 on admission --increaed with diuresis, relatively stable, continue to monitor  Polysubstance abuse with EtOH, cocaine, heroin, tobacco UDS positive for opiates.EtOH level less than 10. Discussed  with patient need for complete cessation of tobacco, alcohol and illicit drugs --TOC consult forsubstance abuse --CIWAA protocolwith symptom triggered Ativan --Multivitamin, thiamine, folic acid  Chronic hepatitis C Treatment nave. AST 71, ALT 40; consistent with continued alcohol abuse. --Outpatient follow-up with infectious disease  Recurrent falls secondary to EtOH abuse --therapy recommending rolling walker  Cervical spine stenosiss/p ACDF 07/24/2020 No focal neurological findings on physical exam. --Outpatient follow-up  Headache Try fioricet   Procedures:  none  Consultations:  none  Discharge Exam: Vitals:   02/03/21 0551 02/03/21 0600  BP: 112/86   Pulse: 79   Resp:  (!) 21  Temp: 98.2 F (36.8 C)   SpO2: 98%    Eager to go home, feeling better today  General: No acute distress. Cardiovascular: Heart sounds show a regular rate, and rhythm. Lungs: Clear to auscultation bilaterally Abdomen: Soft, nontender, nondistended  Neurological: Alert and oriented 3. Moves all extremities 4 . Cranial nerves II through XII grossly intact. Skin: Warm and dry. No rashes or lesions. Extremities: No clubbing or cyanosis. Bilateral LE edema, improving. Psych: smiling, good mood in regards to possible d/c   Discharge Instructions   Discharge Instructions    (HEART FAILURE PATIENTS) Call MD:  Anytime you have any of the following symptoms: 1) 3 pound weight gain in 24 hours or 5 pounds in 1 week 2) shortness of breath, with or without a dry hacking cough 3) swelling in the hands, feet or stomach 4) if you have to sleep on extra pillows at night in order to breathe.   Complete by: As directed    Call MD for:  difficulty breathing, headache or visual disturbances   Complete by: As directed  Call MD for:  persistant dizziness or light-headedness   Complete by: As directed    Call MD for:  persistant nausea and vomiting   Complete by: As directed    Call MD  for:  redness, tenderness, or signs of infection (pain, swelling, redness, odor or green/yellow discharge around incision site)   Complete by: As directed    Call MD for:  severe uncontrolled pain   Complete by: As directed    Call MD for:  temperature >100.4   Complete by: As directed    Diet - low sodium heart healthy   Complete by: As directed    Discharge instructions   Complete by: As directed    You were seen for hyponatremia.    You've improved with diuresis.  It's extremely important that you continue your lasix.  Please follow up with your PCP for repeat labs within 1 week.    Please follow up with your PCP regarding your hepatitis C and potential follow up with ID for treatment.  You should establish with your gastroenterologist as well, though there's documentation in the chart that you may have been dismissed from the [facility] practice.  Please follow up with your PCP to discuss follow up with gastroenterology.    Return to the hospital for new, recurrent, or worsening symptoms.  Please ask your PCP to request records from this hospitalization so they know what was done and what the next steps will be.   Increase activity slowly   Complete by: As directed      Allergies as of 02/03/2021      Reactions   Angiotensin Receptor Blockers Anaphylaxis, Other (See Comments)   (Angioedema also with Lisinopril, therefore ARB's are contraindicated)   Lisinopril Anaphylaxis, Swelling   Throat swelling   Pamelor [nortriptyline Hcl] Anaphylaxis, Swelling   Throat swells      Medication List    STOP taking these medications   ondansetron 4 MG tablet Commonly known as: ZOFRAN   Oxycodone HCl 10 MG Tabs   oxyCODONE-acetaminophen 5-325 MG tablet Commonly known as: PERCOCET/ROXICET   pantoprazole 40 MG tablet Commonly known as: Protonix   sodium chloride 1 g tablet     TAKE these medications   Accu-Chek Guide Me w/Device Kit Use as instructed daily   Accu-Chek Guide  test strip Generic drug: glucose blood Use as instructed daily   amiodarone 200 MG tablet Commonly known as: PACERONE Take 200 mg by mouth daily.   atorvastatin 20 MG tablet Commonly known as: LIPITOR Take 1 tablet (20 mg total) by mouth daily at 6 PM. What changed: when to take this   dapagliflozin propanediol 5 MG Tabs tablet Commonly known as: FARXIGA Take 5 mg by mouth daily.   DULoxetine 60 MG capsule Commonly known as: CYMBALTA TAKE 1 CAPSULE (60 MG TOTAL) BY MOUTH DAILY. (MORNING) What changed: See the new instructions.   furosemide 40 MG tablet Commonly known as: LASIX Take 33m in the morning and 461min the evening What changed:   how much to take  how to take this  when to take this   gabapentin 300 MG capsule Commonly known as: NEURONTIN TAKE 2 CAPSULES IN THE MORNING AND 3 CAPSULES IN THE EVENING What changed:   how much to take  how to take this  when to take this  additional instructions   isosorbide-hydrALAZINE 20-37.5 MG tablet Commonly known as: BIDIL Take 1 tablet by mouth 3 (three) times daily.   polyethylene glycol 17  g packet Commonly known as: MIRALAX / GLYCOLAX Take 17 g by mouth 2 (two) times daily. What changed:   when to take this  reasons to take this   potassium chloride SA 20 MEQ tablet Commonly known as: KLOR-CON Take 1 tablet (20 mEq total) by mouth daily.   thiamine 100 MG tablet Take 1 tablet (100 mg total) by mouth daily.            Durable Medical Equipment  (From admission, onward)         Start     Ordered   02/03/21 1449  DME Walker  Once       Question Answer Comment  Walker: With 5 Inch Wheels   Patient needs a walker to treat with the following condition Physical deconditioning      02/03/21 1448         Allergies  Allergen Reactions  . Angiotensin Receptor Blockers Anaphylaxis and Other (See Comments)    (Angioedema also with Lisinopril, therefore ARB's are contraindicated)  .  Lisinopril Anaphylaxis and Swelling    Throat swelling  . Pamelor [Nortriptyline Hcl] Anaphylaxis and Swelling    Throat swells    Follow-up Information    [facility] Follow up.   Why: [place] [address] [phone] For Outpatient drug and alcohol treatment programs       Services, [facility] Follow up.   Why: For Inpatient drug and alcohol treatment services. Contact information: [address] [place] [address] [phone]                The results of significant diagnostics from this hospitalization (including imaging, microbiology, ancillary and laboratory) are listed below for reference.    Significant Diagnostic Studies: DG Chest 2 View  Result Date: 01/15/2021 CLINICAL DATA:  Cough, shortness of breath, weakness, and leg swelling for 1 day, history of CHF, prostate cancer, diabetes mellitus, atrial fibrillation, hypertension, smoker EXAM: CHEST - 2 VIEW COMPARISON:  10/24/2020 FINDINGS: Enlargement of cardiac silhouette with pulmonary vascular congestion. Mild interstitial infiltrates consistent with pulmonary edema, slightly greater on RIGHT. Associated RIGHT pleural effusion and basilar atelectasis. Tiny LEFT pleural effusion. No pneumothorax or acute osseous findings. IMPRESSION: Enlargement of cardiac silhouette with pulmonary vascular congestion and minimal pulmonary edema. Bibasilar effusions RIGHT greater than LEFT with RIGHT basilar atelectasis. Electronically Signed   By: Lavonia Dana M.D.   On: 01/15/2021 10:04   DG Lumbar Spine 2-3 Views  Result Date: 01/27/2021 CLINICAL DATA:  Pain after fall EXAM: LUMBAR SPINE - 2-3 VIEW COMPARISON:  10/02/2017, le[place] 1721 FINDINGS: Frontal and lateral views of the lumbar spine demonstrate 5 non-rib-bearing lumbar type vertebral bodies in stable anatomic alignment. No acute displaced fracture. Disc spaces are well preserved. Stable mild facet hypertrophy at  the lumbosacral junction. Diffuse atherosclerosis unchanged. IMPRESSION: 1. No acute lumbar spine fracture. Electronically Signed   By: Randa Ngo M.D.   On: 01/27/2021 15:53   CT Head Wo Contrast  Result Date: 01/27/2021 CLINICAL DATA:  Status post fall. Laceration above the eye. Five by his roommate. EXAM: CT HEAD WITHOUT CONTRAST CT MAXILLOFACIAL WITHOUT CONTRAST CT CERVICAL SPINE WITHOUT CONTRAST TECHNIQUE: Multidetector CT imaging of the head, cervical spine, and maxillofacial structures were performed using the standard protocol without intravenous contrast. Multiplanar CT image reconstructions of the cervical spine and maxillofacial structures were also generated. COMPARISON:  11/26/2020 FINDINGS: CT HEAD FINDINGS Brain: No evidence of acute infarction, hemorrhage, extra-axial  collection, ventriculomegaly, or mass effect. Generalized cerebral atrophy. Periventricular white matter low attenuation likely secondary to microangiopathy. Vascular: Cerebrovascular atherosclerotic calcifications are noted. Skull: Negative for fracture or focal lesion. Sinuses/Orbits: Visualized portions of the orbits are unremarkable. Visualized portions of the paranasal sinuses are unremarkable. Visualized portions of the mastoid air cells are unremarkable. Other: None. CT MAXILLOFACIAL FINDINGS Osseous: No fracture or mandibular dislocation. No destructive process. Osteoarthritis of bilateral temporomandibular joints. Orbits: Negative. No traumatic or inflammatory finding. Sinuses: Mild mucosal thickening of bilateral maxillary sinuses. Periapical lucency around the left upper central incisor and right upper premolar as can be seen with dental disease. Soft tissues: Severe soft tissue swelling around the right orbit and cheek. CT CERVICAL SPINE FINDINGS Alignment: Normal. Skull base and vertebrae: No acute fracture. No primary bone lesion or focal pathologic process. Soft tissues and spinal canal: No prevertebral fluid or  swelling. No visible canal hematoma. Disc levels: Anterior cervical fusion from C4 through C6 with plate and screw fixation. Broad osseous ridging at C4-5 and C5-6 eccentric towards the right with bilateral right foraminal narrowing, most severe at C5-6. Upper chest: Lung apices are clear. Other: Bilateral carotid artery atherosclerosis. IMPRESSION: 1. No acute intracranial pathology. 2. No acute osseous injury of the maxillofacial bones. 3. No acute osseous injury of the cervical spine. 4. Anterior cervical fusion from C4 through C6 with plate and screw fixation. Broad osseous ridging at C4-5 and C5-6 eccentric towards the right with bilateral right foraminal narrowing, most severe at C5-6. Electronically Signed   By: Kathreen Devoid   On: 01/27/2021 15:45   CT Cervical Spine Wo Contrast  Result Date: 01/27/2021 CLINICAL DATA:  Status post fall. Laceration above the eye. Five by his roommate. EXAM: CT HEAD WITHOUT CONTRAST CT MAXILLOFACIAL WITHOUT CONTRAST CT CERVICAL SPINE WITHOUT CONTRAST TECHNIQUE: Multidetector CT imaging of the head, cervical spine, and maxillofacial structures were performed using the standard protocol without intravenous contrast. Multiplanar CT image reconstructions of the cervical spine and maxillofacial structures were also generated. COMPARISON:  11/26/2020 FINDINGS: CT HEAD FINDINGS Brain: No evidence of acute infarction, hemorrhage, extra-axial collection, ventriculomegaly, or mass effect. Generalized cerebral atrophy. Periventricular white matter low attenuation likely secondary to microangiopathy. Vascular: Cerebrovascular atherosclerotic calcifications are noted. Skull: Negative for fracture or focal lesion. Sinuses/Orbits: Visualized portions of the orbits are unremarkable. Visualized portions of the paranasal sinuses are unremarkable. Visualized portions of the mastoid air cells are unremarkable. Other: None. CT MAXILLOFACIAL FINDINGS Osseous: No fracture or mandibular  dislocation. No destructive process. Osteoarthritis of bilateral temporomandibular joints. Orbits: Negative. No traumatic or inflammatory finding. Sinuses: Mild mucosal thickening of bilateral maxillary sinuses. Periapical lucency around the left upper central incisor and right upper premolar as can be seen with dental disease. Soft tissues: Severe soft tissue swelling around the right orbit and cheek. CT CERVICAL SPINE FINDINGS Alignment: Normal. Skull base and vertebrae: No acute fracture. No primary bone lesion or focal pathologic process. Soft tissues and spinal canal: No prevertebral fluid or swelling. No visible canal hematoma. Disc levels: Anterior cervical fusion from C4 through C6 with plate and screw fixation. Broad osseous ridging at C4-5 and C5-6 eccentric towards the right with bilateral right foraminal narrowing, most severe at C5-6. Upper chest: Lung apices are clear. Other: Bilateral carotid artery atherosclerosis. IMPRESSION: 1. No acute intracranial pathology. 2. No acute osseous injury of the maxillofacial bones. 3. No acute osseous injury of the cervical spine. 4. Anterior cervical fusion from C4 through C6 with plate and screw fixation. Broad osseous  ridging at C4-5 and C5-6 eccentric towards the right with bilateral right foraminal narrowing, most severe at C5-6. Electronically Signed   By: Kathreen Devoid   On: 01/27/2021 15:45   DG Chest Port 1 View  Result Date: 01/27/2021 CLINICAL DATA:  Weakness, fell EXAM: PORTABLE CHEST 1 VIEW COMPARISON:  01/15/2021 FINDINGS: Single frontal view of the chest demonstrates stable enlargement of the cardiac silhouette. There is central vascular congestion, with mild perihilar ground-glass airspace disease consistent with mild congestive heart failure. No effusion or pneumothorax. No acute fracture. IMPRESSION: 1. Mild congestive heart failure. Electronically Signed   By: Randa Ngo M.D.   On: 01/27/2021 15:54   CT Maxillofacial Wo Contrast  Result  Date: 01/27/2021 CLINICAL DATA:  Status post fall. Laceration above the eye. Five by his roommate. EXAM: CT HEAD WITHOUT CONTRAST CT MAXILLOFACIAL WITHOUT CONTRAST CT CERVICAL SPINE WITHOUT CONTRAST TECHNIQUE: Multidetector CT imaging of the head, cervical spine, and maxillofacial structures were performed using the standard protocol without intravenous contrast. Multiplanar CT image reconstructions of the cervical spine and maxillofacial structures were also generated. COMPARISON:  11/26/2020 FINDINGS: CT HEAD FINDINGS Brain: No evidence of acute infarction, hemorrhage, extra-axial collection, ventriculomegaly, or mass effect. Generalized cerebral atrophy. Periventricular white matter low attenuation likely secondary to microangiopathy. Vascular: Cerebrovascular atherosclerotic calcifications are noted. Skull: Negative for fracture or focal lesion. Sinuses/Orbits: Visualized portions of the orbits are unremarkable. Visualized portions of the paranasal sinuses are unremarkable. Visualized portions of the mastoid air cells are unremarkable. Other: None. CT MAXILLOFACIAL FINDINGS Osseous: No fracture or mandibular dislocation. No destructive process. Osteoarthritis of bilateral temporomandibular joints. Orbits: Negative. No traumatic or inflammatory finding. Sinuses: Mild mucosal thickening of bilateral maxillary sinuses. Periapical lucency around the left upper central incisor and right upper premolar as can be seen with dental disease. Soft tissues: Severe soft tissue swelling around the right orbit and cheek. CT CERVICAL SPINE FINDINGS Alignment: Normal. Skull base and vertebrae: No acute fracture. No primary bone lesion or focal pathologic process. Soft tissues and spinal canal: No prevertebral fluid or swelling. No visible canal hematoma. Disc levels: Anterior cervical fusion from C4 through C6 with plate and screw fixation. Broad osseous ridging at C4-5 and C5-6 eccentric towards the right with bilateral right  foraminal narrowing, most severe at C5-6. Upper chest: Lung apices are clear. Other: Bilateral carotid artery atherosclerosis. IMPRESSION: 1. No acute intracranial pathology. 2. No acute osseous injury of the maxillofacial bones. 3. No acute osseous injury of the cervical spine. 4. Anterior cervical fusion from C4 through C6 with plate and screw fixation. Broad osseous ridging at C4-5 and C5-6 eccentric towards the right with bilateral right foraminal narrowing, most severe at C5-6. Electronically Signed   By: Kathreen Devoid   On: 01/27/2021 15:45    Microbiology: Recent Results (from the past 240 hour(s))  SARS CORONAVIRUS 2 (TAT 6-24 HRS) Nasopharyngeal Nasopharyngeal Swab     Status: None   Collection Time: 01/27/21  5:09 PM   Specimen: Nasopharyngeal Swab  Result Value Ref Range Status   SARS Coronavirus 2 NEGATIVE NEGATIVE Final    Comment: (NOTE) SARS-CoV-2 target nucleic acids are NOT DETECTED.  The SARS-CoV-2 RNA is generally detectable in upper and lower respiratory specimens during the acute phase of infection. Negative results do not preclude SARS-CoV-2 infection, do not rule out co-infections with other pathogens, and should not be used as the sole basis for treatment or other patient management decisions. Negative results must be combined with clinical observations, patient history, and  epidemiological information. The expected result is Negative.  Fact Sheet for Patients: SugarRoll.be  Fact Sheet for Healthcare Providers: https://www.woods-mathews.com/  This test is not yet approved or cleared by the [place] FDA and  has been authorized for detection and/or diagnosis of SARS-CoV-2 by FDA under an Emergency Use Authorization (EUA). This EUA will remain  in effect (meaning this test can be used) for the duration of the COVID-19 declaration under Se ction 564(b)(1) of the Act, 21 U.S.C. section 360bbb-3(b)(1), unless the  authorization is terminated or revoked sooner.  Performed at [facility], [place] [address], [place], [place] [address]      Labs: Basic Metabolic Panel: Recent Labs  Lab 01/28/21 [id number] 01/28/21 1345 01/30/21 0552 01/30/21 1335 01/31/21 7416 02/01/21 0602 02/02/21 0644 02/03/21 0601  NA 118*   < > 121* 124* 125* 127* 131* 130*  K 3.7   < > 3.2* 2.8* 3.9 3.7 3.5 3.3*  CL 83*   < > 85* 85* 87* 90* 91* 90*  CO2 25   < > _0 GLUCOSE 110*   < > 136* 148* 145* 156* 141* 147*  BUN 14   < > 29* 30* 31* 33* 34* 34*  CREATININE 1.15   < > 1.49* 1.47* 1.51* 1.50* 1.52* 1.45*  CALCIUM 7.9*   < > 8.1* 8.1* 8.1* 8.1* 8.1* 8.2*  MG 1.5*  --  1.8  --  1.6* 1.4* 1.6* 1.7  PHOS 2.7  --   --   --  2.9 3.3 3.6 4.2   < > = values in this interval not displayed.   Liver Function Tests: Recent Labs  Lab 01/30/21 0552 01/31/21 0613 02/01/21 0602 02/02/21 0644 02/03/21 0601  AST 42* 41 35 31 29  ALT _1 ALKPHOS 60 56 57 52 49  BILITOT 0.5 0.6 0.5 0.7 0.8  PROT 5.5* 5.4* 5.5* 5.2* 5.2*  ALBUMIN 2.5* 2.3* 2.3* 2.2* 2.2*   No results for input(s): LIPASE, AMYLASE in the last 168 hours. No results for input(s): AMMONIA in the last 168 hours. CBC: Recent Labs  Lab 01/27/21 1554 01/31/21 0613 02/01/21 0602 02/02/21 0644 02/03/21 0601  WBC 5.3 6.1 5.3 4.0 4.7  NEUTROABS 4.4 4.2 3.7 2.7 3.3  HGB 11.4* 11.1* 11.0* 10.5* 10.5*  HCT 33.5* 34.8* 33.8* 31.7* 31.6*  MCV 82.1 87.2 85.6 85.9 83.8  PLT 236 161 171 169 174   Cardiac Enzymes: Recent Labs  Lab 01/27/21 1554 01/28/21 0511  CKTOTAL 405* 270   BNP: BNP (last 3 results) Recent Labs    01/15/21 1740 01/27/21 1554 01/30/21 0838  BNP 1,835.8* 1,177.1* 1,332.6*    ProBNP (last 3 results) No results for input(s): PROBNP in the last 8760 hours.  CBG: Recent Labs  Lab 02/02/21 1200 02/02/21 1644 02/02/21 2106 02/03/21 0754 02/03/21 1231  GLUCAP 148* 141* 194* 124* 211*        Signed:  Fayrene Helper MD.  [facility] 02/03/2021, 2:52 PM

## 2021-02-03 NOTE — Progress Notes (Signed)
AVS reviewed and questions answered.  Patient alert and oriented, skin warm and dry.  Transported to main lobby and care transferred to family/friend without incident.  

## 2021-02-03 NOTE — TOC Progression Note (Signed)
Transition of Care [facility]) - Progression Note    Patient Details  Name: Brad [name] MRN: [id number] Date of Birth: 1961/04/14  [facility] of [facility]) CM/SW Contact  Joaquin Courts, RN Phone Number: 02/03/2021, 3:04 PM  Clinical Narrative:    [employer] to deliver rolling walker to bedside for home use.   Expected Discharge Plan: Home/Self Care Barriers to Discharge: No Barriers Identified  Expected Discharge Plan and Services Expected Discharge Plan: Home/Self Care   Discharge Planning Services: CM Consult     Expected Discharge Date: 02/03/21               DME Arranged: Gilford Rile rolling DME Agency: [employer] Date DME Agency Contacted: 02/03/21 Time DME Agency Contacted: [phone] Representative spoke with at DME Agency: [place] ([place]) Interventions    Readmission Risk Interventions Readmission Risk Prevention Plan 08/17/2020 07/23/2020 06/13/2020  Transportation Screening Complete Complete Complete  PCP or Specialist Appt within 5-7 Days - - -  Home Care Screening - - -  Medication Review (RN CM) - - -  Medication Review (RN Transport planner) - Referral to Pharmacy Complete  PCP or Specialist appointment within 3-5 days of discharge Complete Complete Complete  PCP/Specialist Appt Not Complete comments - - -  HRI or Home Care Consult Complete Complete Complete  HRI or Home Care Consult Pt Refusal Comments - - -  SW Recovery Care/Counseling Consult Complete Complete Complete  Palliative Care Screening Not Applicable Not Applicable Not Applicable  Skilled Nursing Facility Not Applicable Complete Complete  Some recent data might be hidden

## 2021-02-04 ENCOUNTER — Telehealth: Payer: Self-pay

## 2021-02-04 ENCOUNTER — Telehealth (HOSPITAL_COMMUNITY): Payer: Self-pay

## 2021-02-04 ENCOUNTER — Telehealth: Payer: Self-pay | Admitting: Family Medicine

## 2021-02-04 NOTE — Telephone Encounter (Signed)
Transition Care Management Follow-up Telephone Call  Call returned to patient. He said he needs to get a new phone and in the meantime can be reached at # [phone]  Date of discharge and from where: 02/03/2021, [facility]   How have you been since you were released from the hospital? He said he is feeling fine.   Any questions or concerns? No  Items Reviewed:  Did the pt receive and understand the discharge instructions provided? Yes   Medications obtained and verified? Yes he said he has all of his medications and did not have any questions about the med regime.   Other? No   Any new allergies since your discharge? No   Do you have support at home? Yes , his roommate [facility] and Equipment/Supplies: Were home health services ordered? no If so, what is the name of the agency? n/a  Has the agency set up a time to come to the patient's home? not applicable Were any new equipment or medical supplies ordered?  Yes: RW What is the name of the medical supply agency? [employer] Were you able to get the supplies/equipment? yes Do you have any questions related to the use of the equipment or supplies? No  Functional Questionnaire: (I = Independent and D = Dependent) ADLs: independent  Follow up appointments reviewed:   PCP Hospital f/u appt confirmed? Yes  - Dr Margarita Rana 02/12/2021.   [facility] f/u appt confirmed? No  - needs to schedule follow up appointments with cardiology and GI   Are transportation arrangements needed? No   If their condition worsens, is the pt aware to call PCP or go to the Emergency Dept.? Yes  Was the patient provided with contact information for the PCP's office or ED? Yes  Was to pt encouraged to call back with questions or concerns? Yes

## 2021-02-04 NOTE — Telephone Encounter (Signed)
Transition Care Management Unsuccessful Follow-up Telephone Call  Date of discharge and from where:  02/03/2021, [facility]   Attempts:  1st Attempt  Reason for unsuccessful TCM follow-up call:  Left voice message on # [phone]. Call back requested to this CM.  Patient needs to schedule follow up appointment with PCP

## 2021-02-04 NOTE — Telephone Encounter (Signed)
Attempted to reach Mr. Cabello without success. I will be discussing discharge with provider Dr. Margarita Rana and Eden Lathe at [facility].

## 2021-02-04 NOTE — Telephone Encounter (Signed)
From the discharge call.  He said he is doing fine.  Has no questions/concerns.  Has all medication.  Scheduled appointment to see Dr Margarita Rana 02/12/2021

## 2021-02-04 NOTE — Telephone Encounter (Signed)
Copied from [place] [id number]. Topic: Appointment Scheduling - Scheduling Inquiry for Clinic >> Feb 04, 2021 10:59 AM [name] wrote: Reason for CRM: Pt is calling and needs post hospital fup from [place] asap per pt. Pt was d/c on 02-02-2021. Pt does not know what he was dx with. Dr Margarita Rana next hospital fup in mid march 2022  Saw where you had attempted to reach this patient. Please follow up.

## 2021-02-04 NOTE — Telephone Encounter (Signed)
Transition Care Management Unsuccessful Follow-up Telephone Call  Date of discharge and from where:  02/03/21 from [facility]  Attempts:  1st Attempt  Reason for unsuccessful TCM follow-up call:  Left voice message

## 2021-02-05 NOTE — Telephone Encounter (Signed)
Transition Care Management Unsuccessful Follow-up Telephone Call  Date of discharge and from where:  02/03/21 from [facility]  Attempts:  2nd Attempt  Reason for unsuccessful TCM follow-up call:  Left voice message

## 2021-02-06 IMAGING — CT CT CERVICAL SPINE W/O CM
3 of 4 series · 14 of 33 positions shown, 17 images · non-contrast
Comparison: CT 06/06/2019, CT 08/22/2019

CLINICAL DATA: Tripped over curb and struck back of head on
concrete, ethanol on board, on Eliquis

EXAM:
CT HEAD WITHOUT CONTRAST
CT CERVICAL SPINE WITHOUT CONTRAST
TECHNIQUE: Multidetector CT imaging of the head and cervical spine was
performed following the standard protocol without intravenous
contrast. Multiplanar CT image reconstructions of the cervical spine
were also generated.

[Series 7: c_spine 2.0 sag bone · sagittal · 0.52mm/px · 5 of 75 slices shown, 6 images]
[im 25/75  bone]
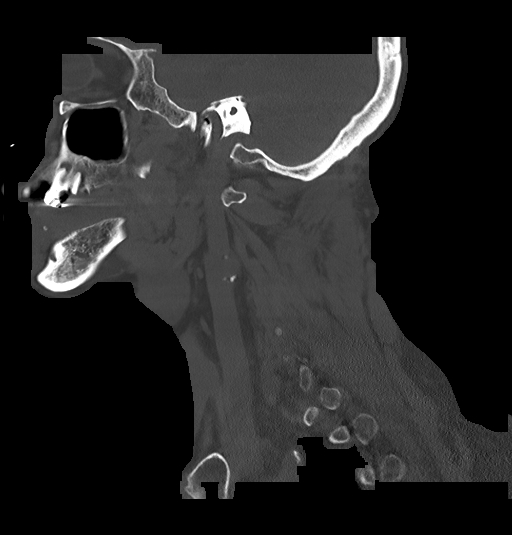
[im 31/75  bone]
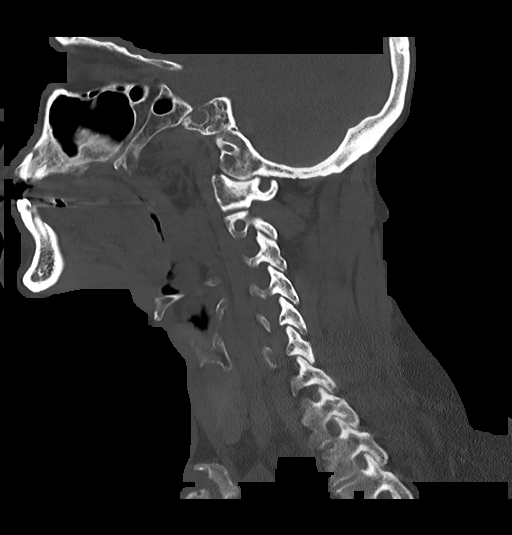
[im 38/75  soft-tissue]
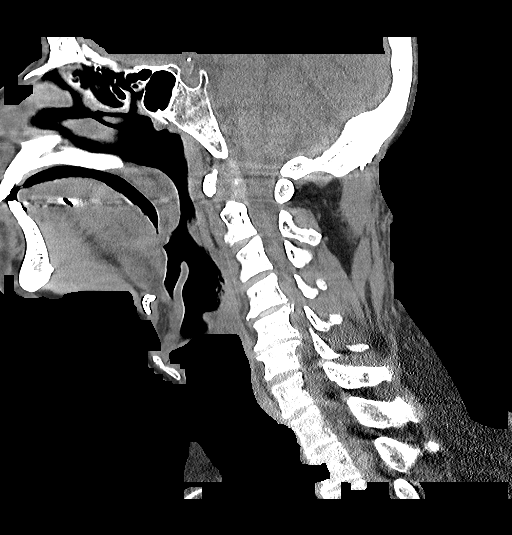
[im 38/75  bone]
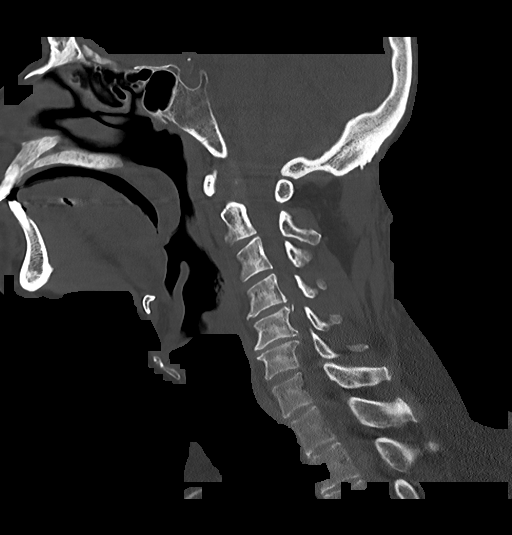
[im 44/75  bone]
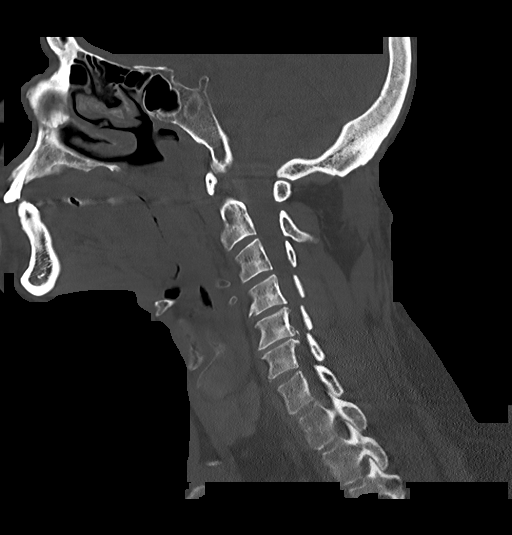
[im 50/75  bone]
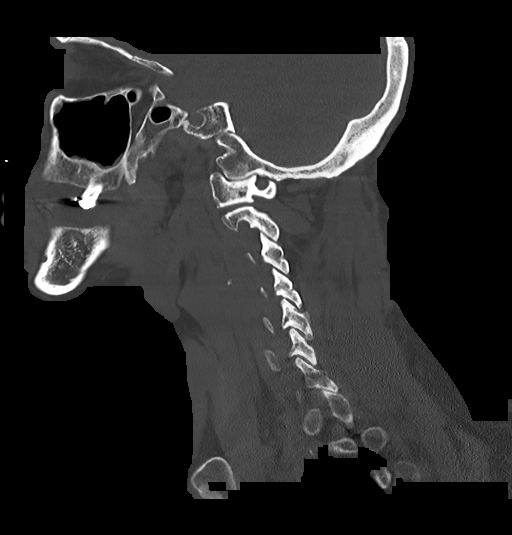

[Series 8: c_spine 2.0 cor bone · coronal · 0.46mm/px · 3 of 85 slices shown]
[im 20/85  bone]
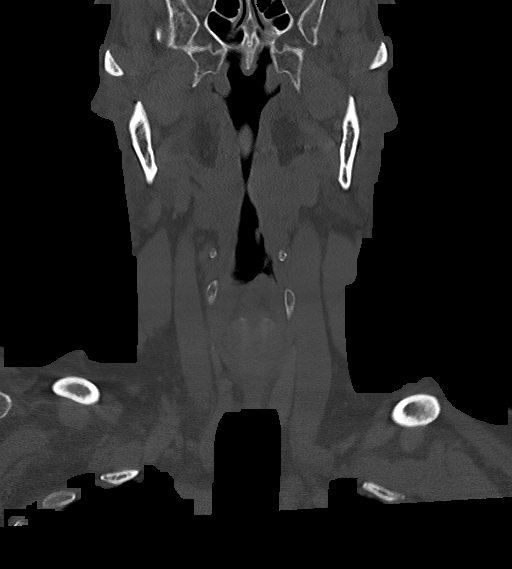
[im 35/85  bone]
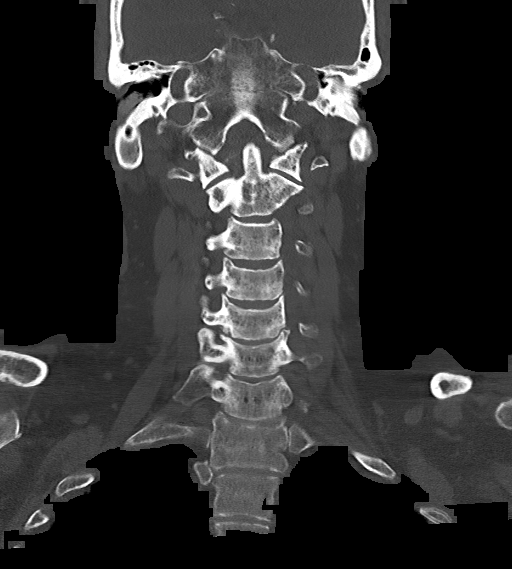
[im 50/85  bone]
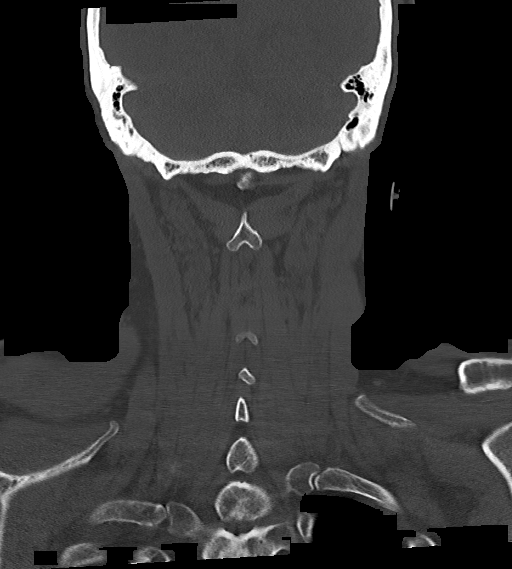

[Series 10: c_spine 1.0 st thins · axial · 0.44mm/px · z∈[-267,-76]mm · 6 of 342 slices shown, 8 images]
[im 35/342  soft-tissue]
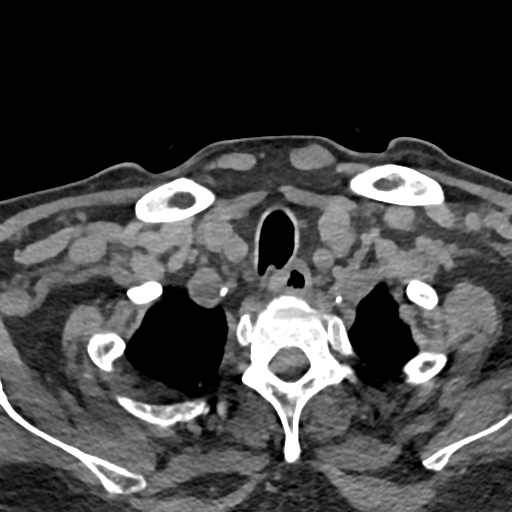
[im 35/342  bone]
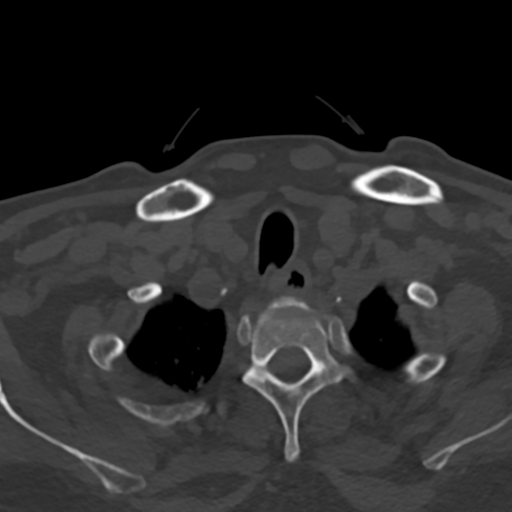
[im 103/342  bone]
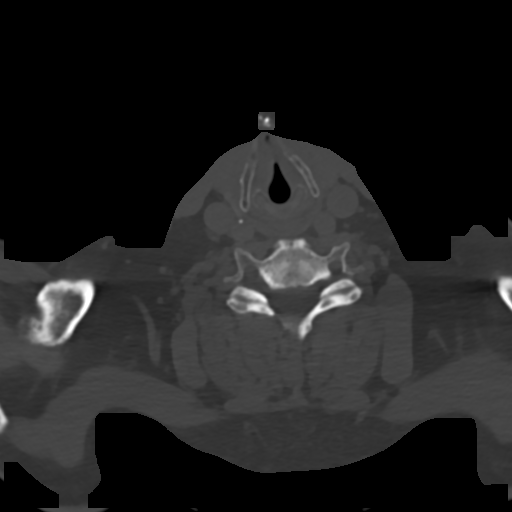
[im 137/342  bone]
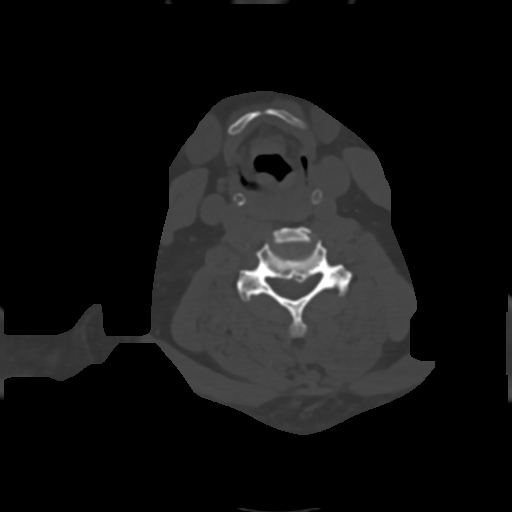
[im 205/342  bone]
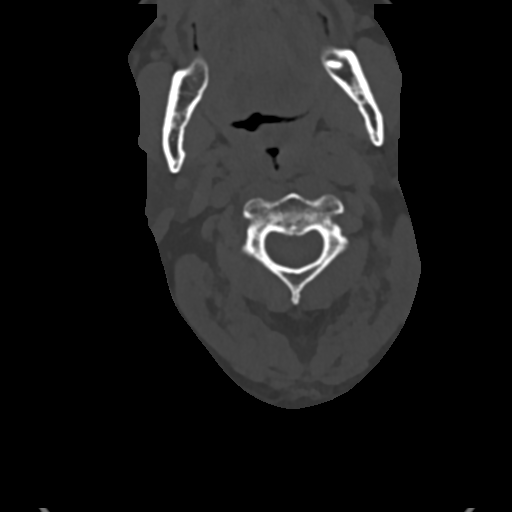
[im 239/342  soft-tissue]
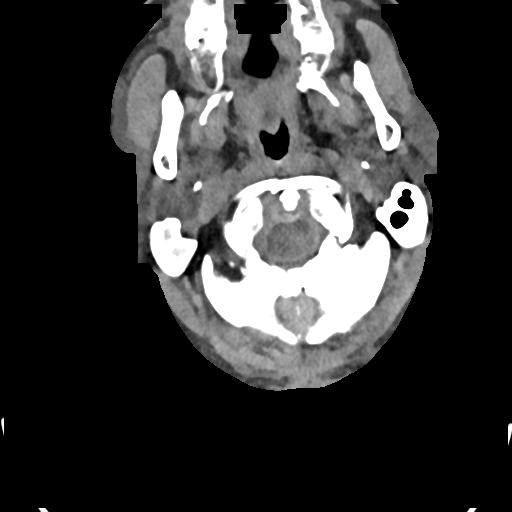
[im 239/342  bone]
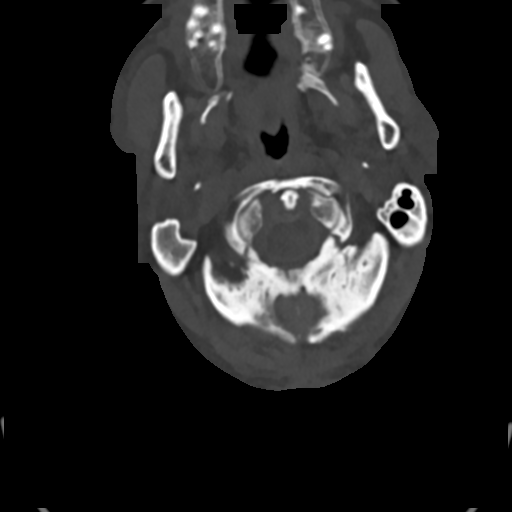
[im 307/342  bone]
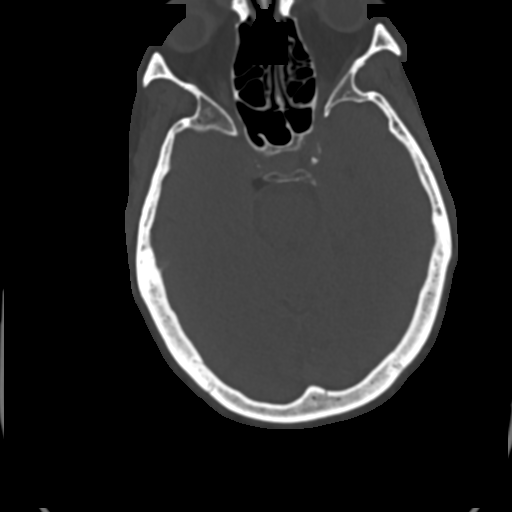

[14 of 33 positions shown; findings below may reference images not displayed]

FINDINGS: CT HEAD FINDINGS

Brain: No evidence of acute infarction, hemorrhage, hydrocephalus,
extra-axial collection or mass lesion/mass effect. Symmetric
prominence of the ventricles, cisterns and sulci compatible with
parenchymal volume loss. Patchy areas of white matter
hypoattenuation are most compatible with chronic microvascular
angiopathy.

Vascular: Atherosclerotic calcification of the carotid siphons. No
hyperdense vessel.

Skull: Right parietal scalp swelling and skin thickening with
radiodense material, possibly foreign body within the laceration
site. Overlying bandaging material is present. No subjacent
calvarial fracture or other acute or suspicious osseous abnormality.

Sinuses/Orbits: Minimal nodular thickening in the anterior left
maxillary sinus. Remaining paranasal sinuses are predominantly
clear. Middle ear cavities are clear. Debris noted in the external
auditory canals. Included orbital structures are unremarkable.

Other: Remote bilateral nasal bone fractures. Few carious lesions
noted in the maxillary dentition.

CT CERVICAL SPINE FINDINGS

Alignment: Straightening and slight reversal of the normal cervical
lordosis at C4-5 I likely degenerative basis without traumatic
listhesis. No abnormally widened, perched or jumped facets. Normal
alignment of the craniocervical and atlantoaxial articulations.

Skull base and vertebrae: No acute fracture. No primary bone lesion
or focal pathologic process.

Soft tissues and spinal canal: No pre or paravertebral fluid or
swelling. No visible canal hematoma.

Disc levels: Multilevel cervical spondylitic changes present
throughout the cervical spine most pronounced at C4-5 and C5-6 where
disc osteophyte complexes result in mild canal stenosis. Additional
uncinate spurring and facet hypertrophic changes at these levels
result in mild bilateral neural foraminal narrowing as well.

Upper chest: Some interlobular septal thickening in the lung apices
may reflect mild edema. No other acute abnormality in the upper
chest or imaged lung apices.

Other: Calcifications present in the proximal great vessels and
cervical carotids. Small amount of gas in the left jugular vein may
be related to intravenous access.
IMPRESSION: 1. No acute intracranial abnormality.
2. Right parietal scalp swelling and skin thickening with radiodense
material, possibly foreign body within the laceration site. No
subjacent calvarial fracture.
3. No acute fracture or traumatic listhesis of the cervical spine.
4. Multilevel cervical spondylitic changes most pronounced at C4-5
and C5-6 where disc osteophyte complexes result in mild canal
stenosis and mild bilateral neural foraminal narrowing.

## 2021-02-06 IMAGING — CT CT HEAD W/O CM
3 of 4 series · 14 of 47 positions shown, 16 images · non-contrast
Comparison: CT 06/06/2019, CT 08/22/2019

CLINICAL DATA: Tripped over curb and struck back of head on
concrete, ethanol on board, on Eliquis

EXAM:
CT HEAD WITHOUT CONTRAST
CT CERVICAL SPINE WITHOUT CONTRAST
TECHNIQUE: Multidetector CT imaging of the head and cervical spine was
performed following the standard protocol without intravenous
contrast. Multiplanar CT image reconstructions of the cervical spine
were also generated.

[Series 4: head without · axial · non-contrast · 0.46mm/px · z∈[-104,+26]mm · 7 of 36 slices shown, 9 images]
[im 5/36  brain]
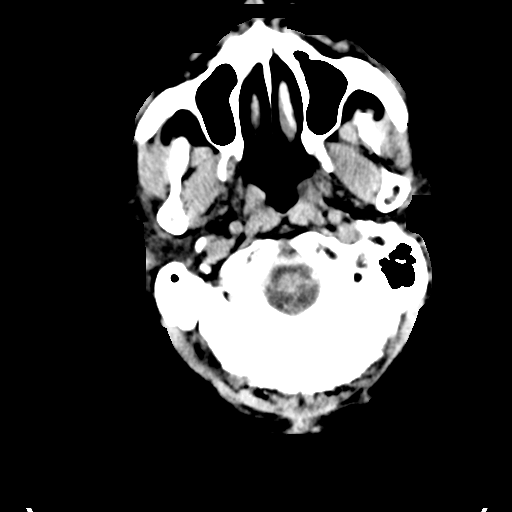
[im 5/36  bone]
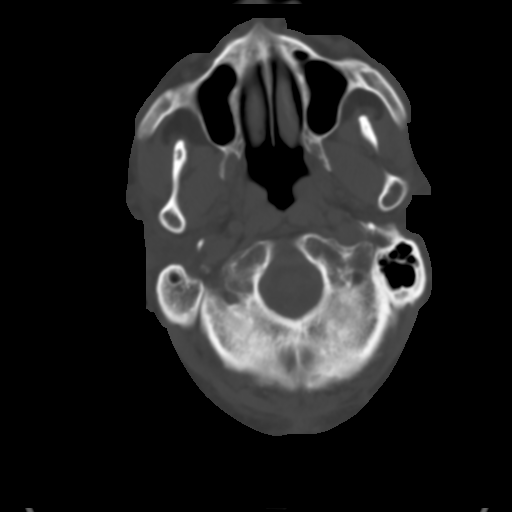
[im 9/36  brain]
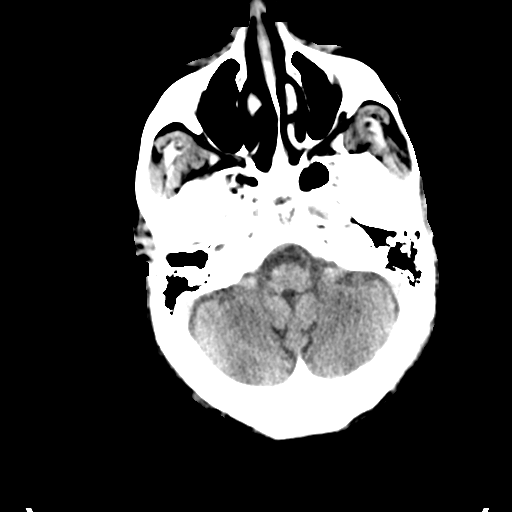
[im 14/36  brain]
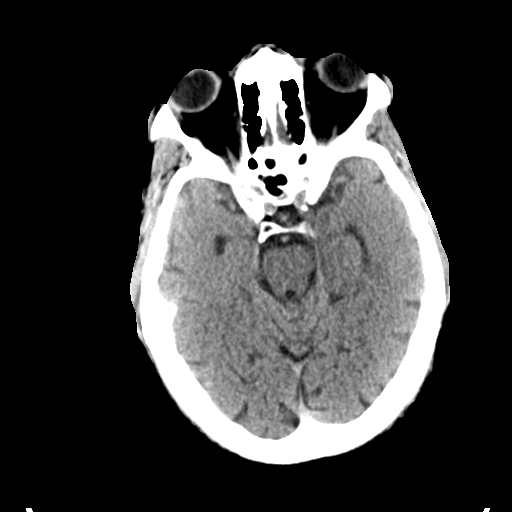
[im 18/36  brain]
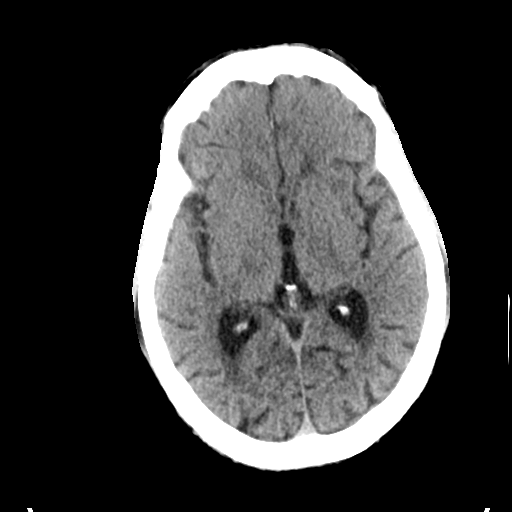
[im 22/36  brain]
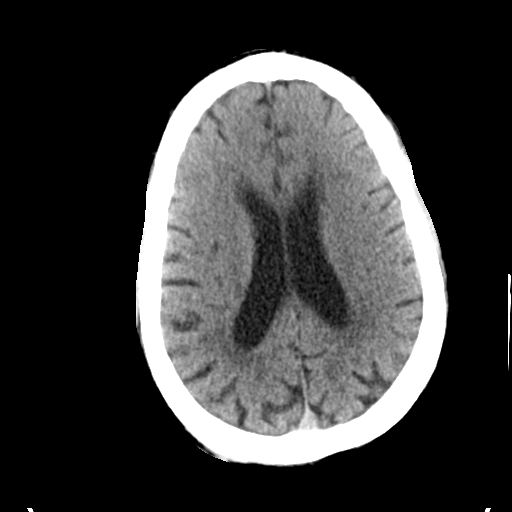
[im 22/36  bone]
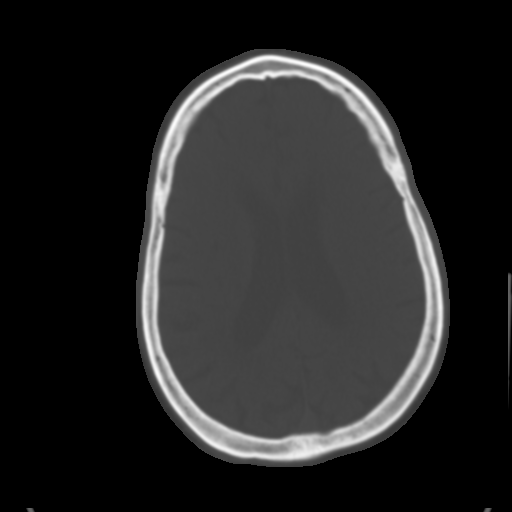
[im 27/36  brain]
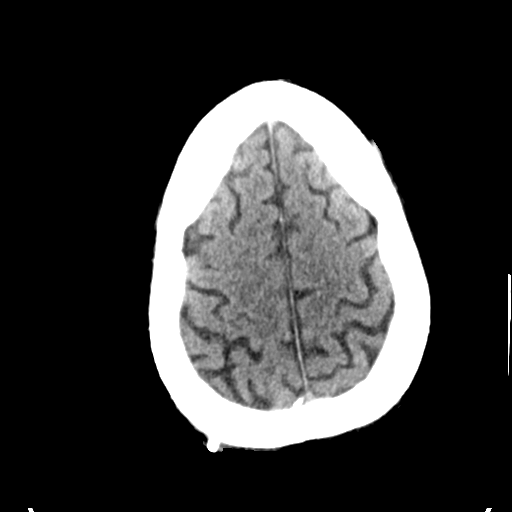
[im 31/36  brain]
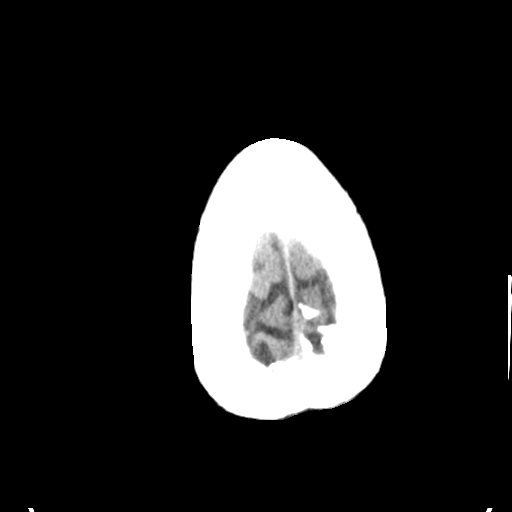

[Series 5: head bone · axial · 0.46mm/px · z∈[-108,-46]mm · 4 of 89 slices shown]
[im 9/89  bone]
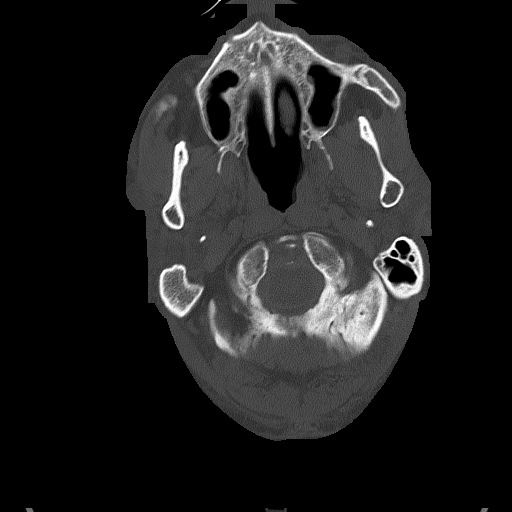
[im 18/89  bone]
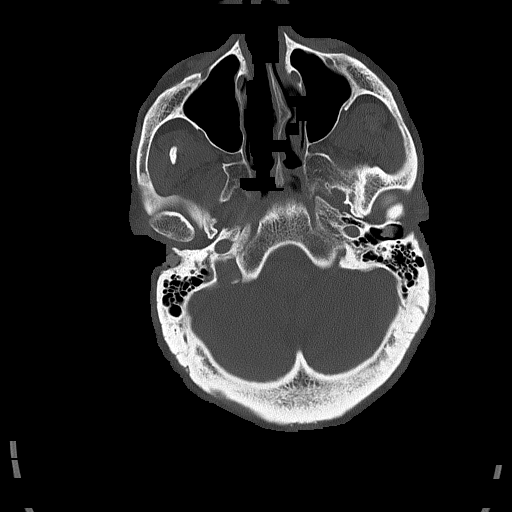
[im 27/89  bone]
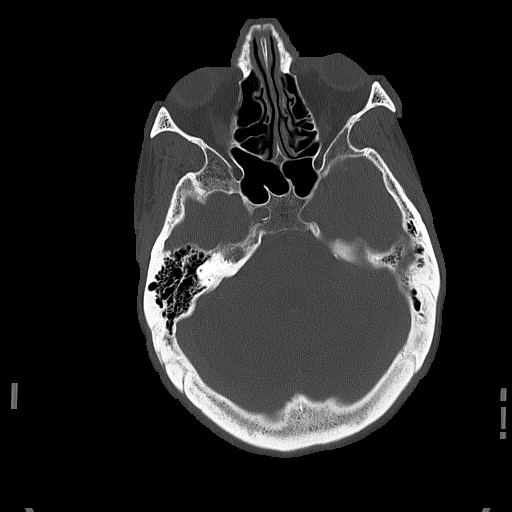
[im 40/89  bone]
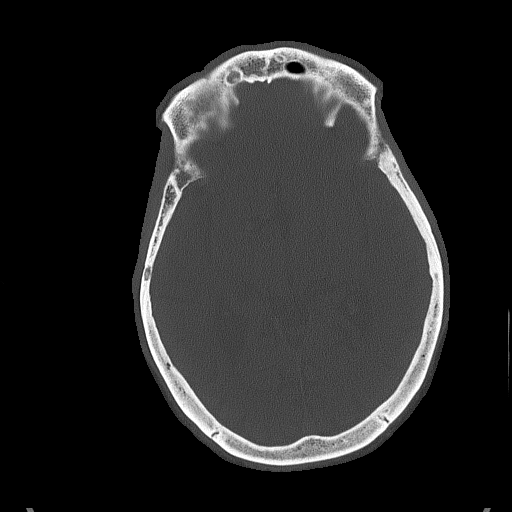

[Series 6: head without cor · coronal · non-contrast · 0.35mm/px · 3 of 83 slices shown]
[im 28/83  brain]
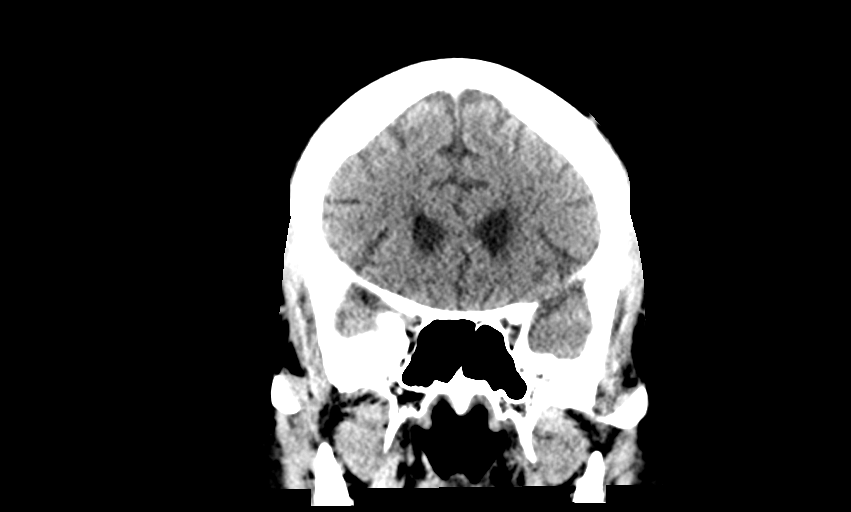
[im 37/83  brain]
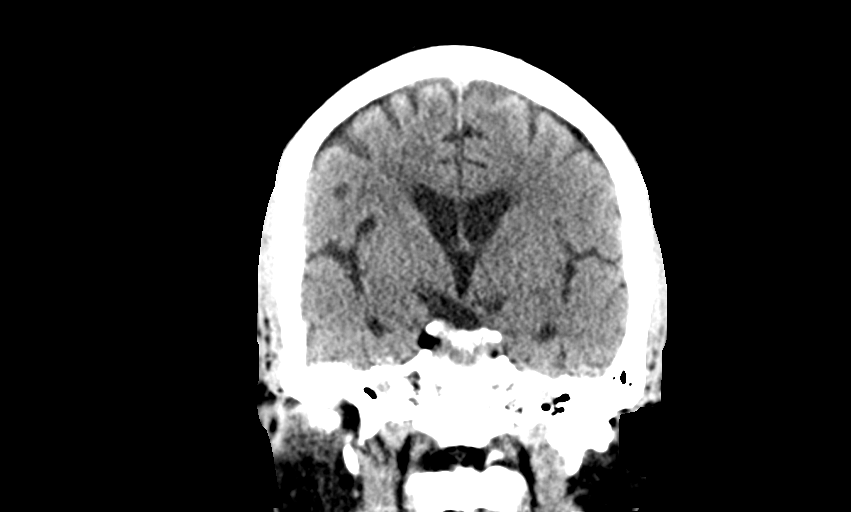
[im 46/83  brain]
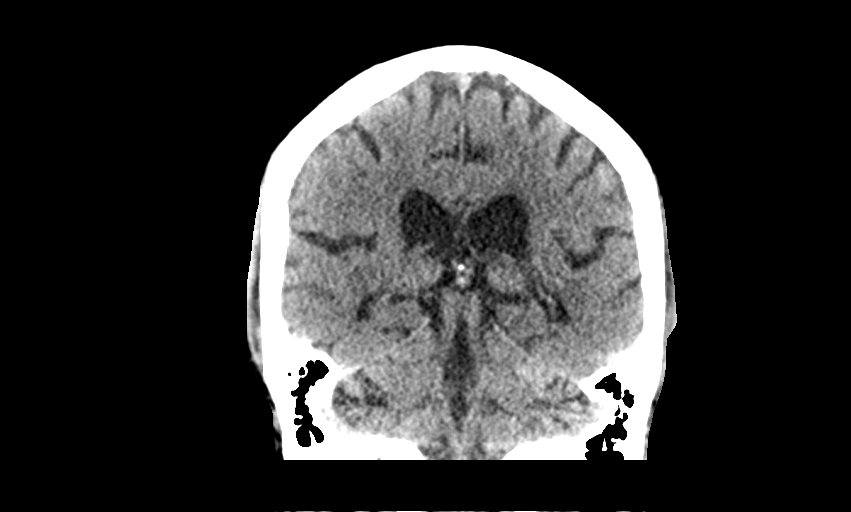

[14 of 47 positions shown; findings below may reference images not displayed]

FINDINGS: CT HEAD FINDINGS

Brain: No evidence of acute infarction, hemorrhage, hydrocephalus,
extra-axial collection or mass lesion/mass effect. Symmetric
prominence of the ventricles, cisterns and sulci compatible with
parenchymal volume loss. Patchy areas of white matter
hypoattenuation are most compatible with chronic microvascular
angiopathy.

Vascular: Atherosclerotic calcification of the carotid siphons. No
hyperdense vessel.

Skull: Right parietal scalp swelling and skin thickening with
radiodense material, possibly foreign body within the laceration
site. Overlying bandaging material is present. No subjacent
calvarial fracture or other acute or suspicious osseous abnormality.

Sinuses/Orbits: Minimal nodular thickening in the anterior left
maxillary sinus. Remaining paranasal sinuses are predominantly
clear. Middle ear cavities are clear. Debris noted in the external
auditory canals. Included orbital structures are unremarkable.

Other: Remote bilateral nasal bone fractures. Few carious lesions
noted in the maxillary dentition.

CT CERVICAL SPINE FINDINGS

Alignment: Straightening and slight reversal of the normal cervical
lordosis at C4-5 I likely degenerative basis without traumatic
listhesis. No abnormally widened, perched or jumped facets. Normal
alignment of the craniocervical and atlantoaxial articulations.

Skull base and vertebrae: No acute fracture. No primary bone lesion
or focal pathologic process.

Soft tissues and spinal canal: No pre or paravertebral fluid or
swelling. No visible canal hematoma.

Disc levels: Multilevel cervical spondylitic changes present
throughout the cervical spine most pronounced at C4-5 and C5-6 where
disc osteophyte complexes result in mild canal stenosis. Additional
uncinate spurring and facet hypertrophic changes at these levels
result in mild bilateral neural foraminal narrowing as well.

Upper chest: Some interlobular septal thickening in the lung apices
may reflect mild edema. No other acute abnormality in the upper
chest or imaged lung apices.

Other: Calcifications present in the proximal great vessels and
cervical carotids. Small amount of gas in the left jugular vein may
be related to intravenous access.
IMPRESSION: 1. No acute intracranial abnormality.
2. Right parietal scalp swelling and skin thickening with radiodense
material, possibly foreign body within the laceration site. No
subjacent calvarial fracture.
3. No acute fracture or traumatic listhesis of the cervical spine.
4. Multilevel cervical spondylitic changes most pronounced at C4-5
and C5-6 where disc osteophyte complexes result in mild canal
stenosis and mild bilateral neural foraminal narrowing.

## 2021-02-06 NOTE — Telephone Encounter (Signed)
Transition Care Management Unsuccessful Follow-up Telephone Call  Date of discharge and from where:  02/03/2021 from [facility]  Attempts:  3rd Attempt  Reason for unsuccessful TCM follow-up call:  Left voice message

## 2021-02-11 ENCOUNTER — Telehealth (HOSPITAL_COMMUNITY): Payer: Self-pay

## 2021-02-11 NOTE — Telephone Encounter (Signed)
Left message for Dermot to return my call. I will continue to reach out.

## 2021-02-12 ENCOUNTER — Ambulatory Visit: Payer: Medicaid Other | Admitting: Family Medicine

## 2021-02-24 IMAGING — CT CT HEAD W/O CM
3 series · 15 of 47 positions shown, 18 images · non-contrast
Comparison: 02/04/2020

CLINICAL DATA: History of recent falls with poor oral intake

EXAM:
CT HEAD WITHOUT CONTRAST
TECHNIQUE: Contiguous axial images were obtained from the base of the skull
through the vertex without intravenous contrast.

[Series 2: head wo · axial · 0.47mm/px · z∈[+1418,+1548]mm · 9 of 32 slices shown, 12 images]
[im 3/32  brain]
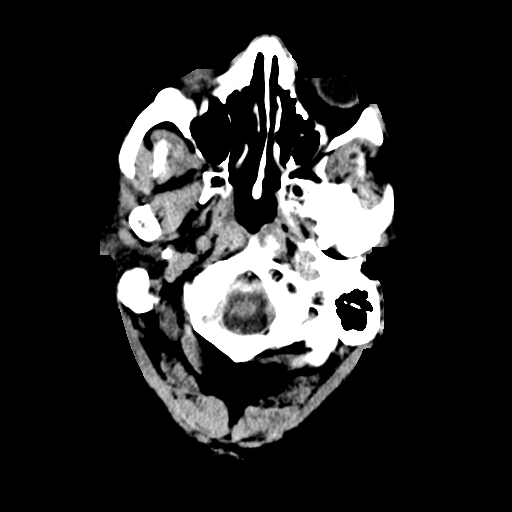
[im 3/32  bone]
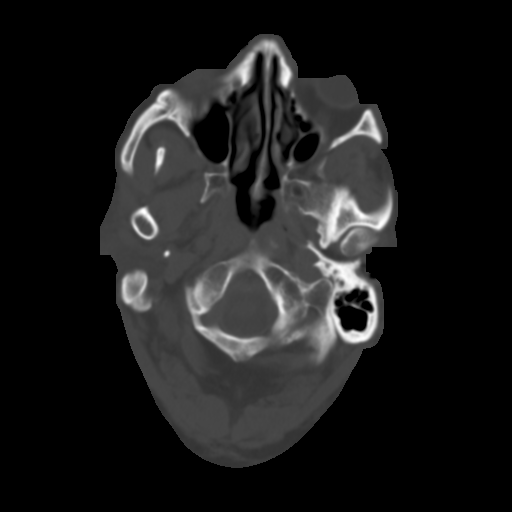
[im 6/32  brain]
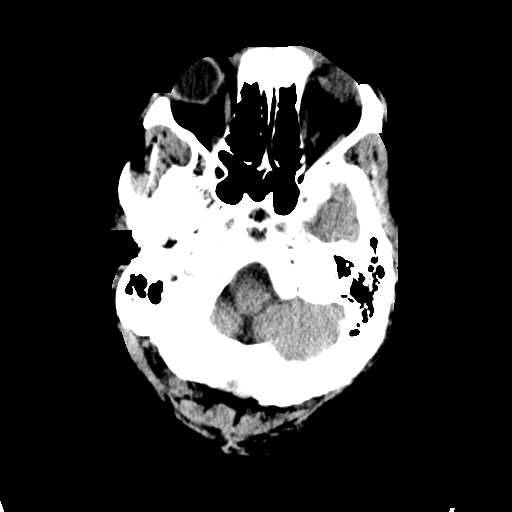
[im 9/32  brain]
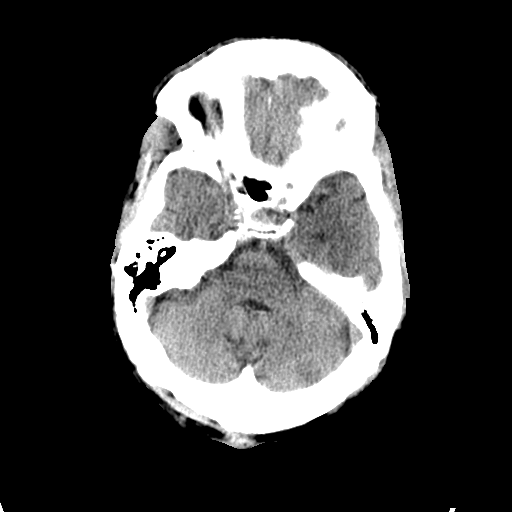
[im 12/32  brain]
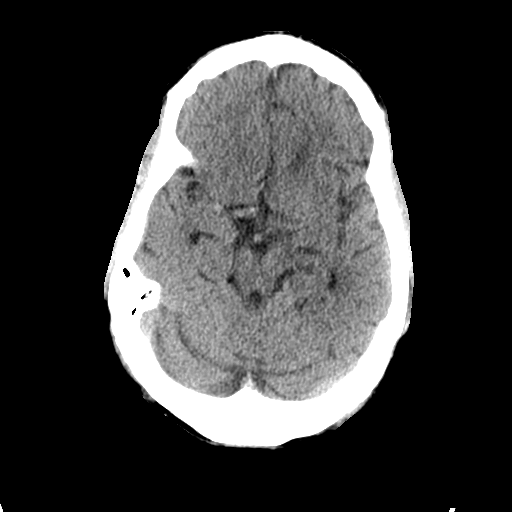
[im 17/32  brain]
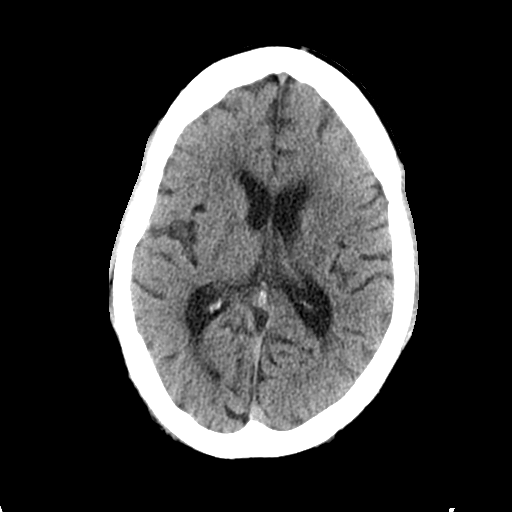
[im 17/32  bone]
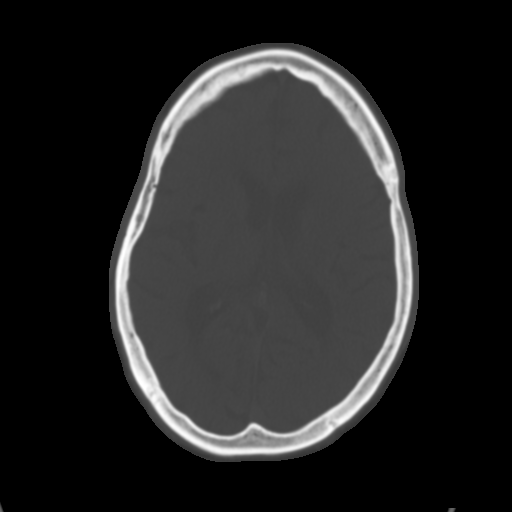
[im 20/32  brain]
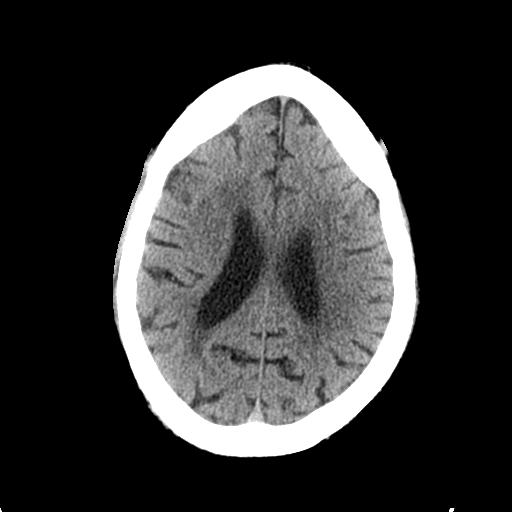
[im 23/32  brain]
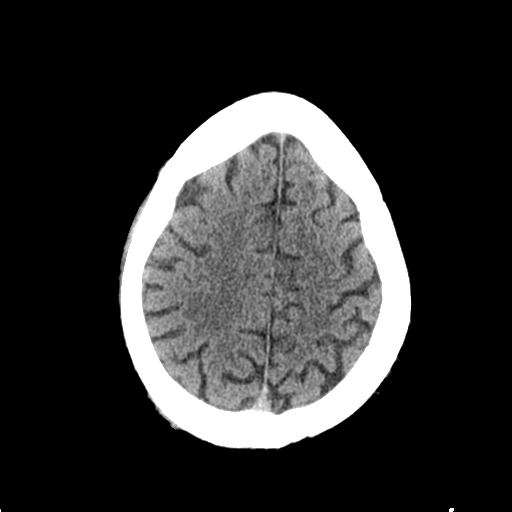
[im 26/32  brain]
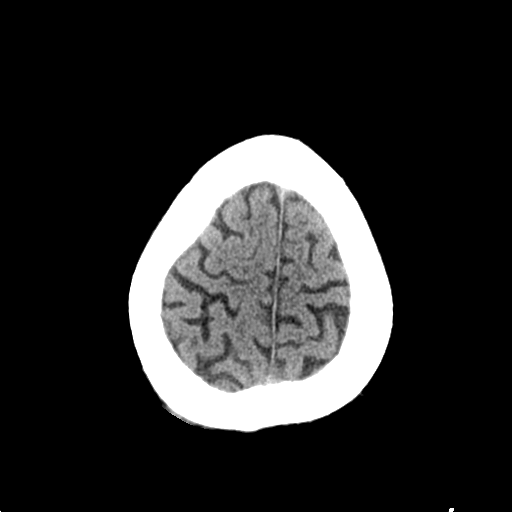
[im 29/32  brain]
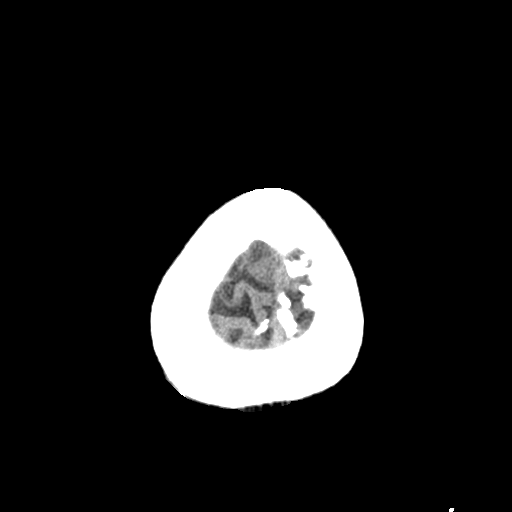
[im 29/32  bone]
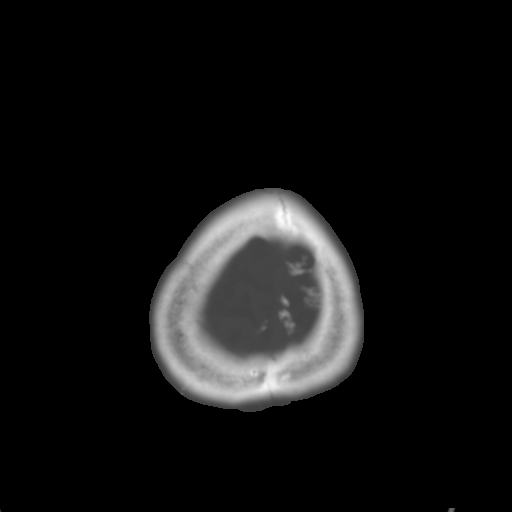

[Series 5: coronal soft tissue · coronal · 0.31mm/px · 3 of 71 slices shown]
[im 24/71  brain]
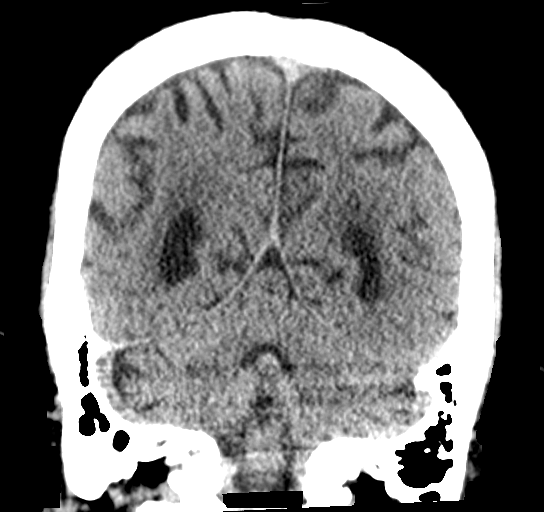
[im 32/71  brain]
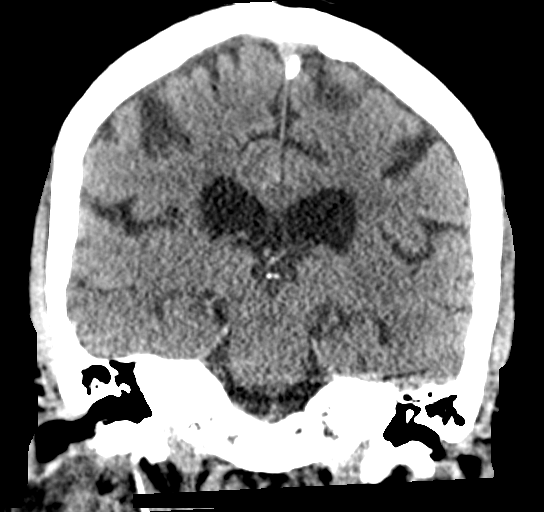
[im 39/71  brain]
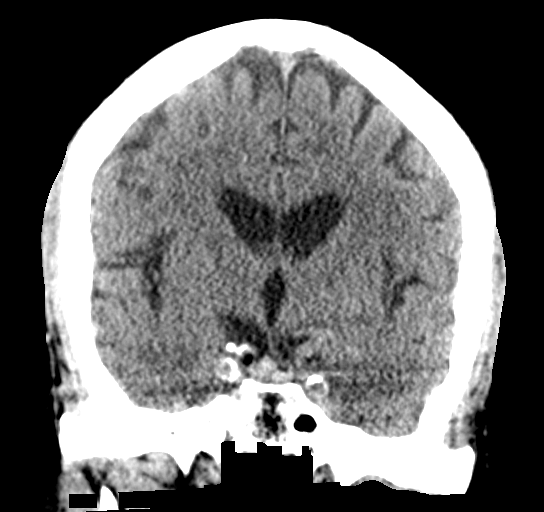

[Series 6: sagittal soft tissue · sagittal · 0.31mm/px · 3 of 57 slices shown]
[im 19/57  brain]
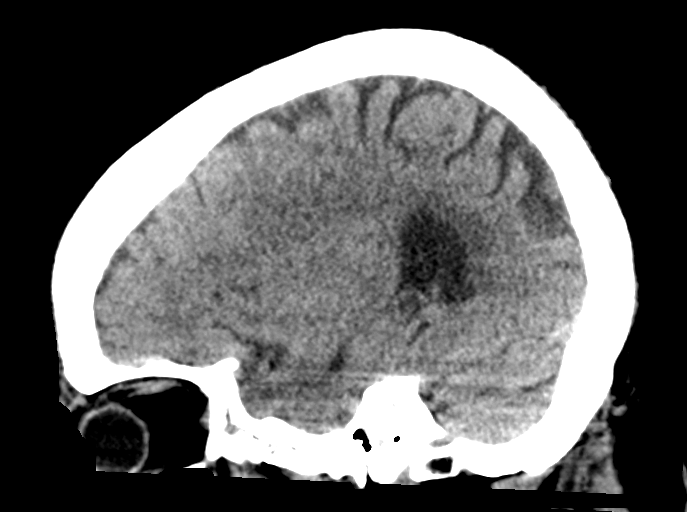
[im 29/57  brain]
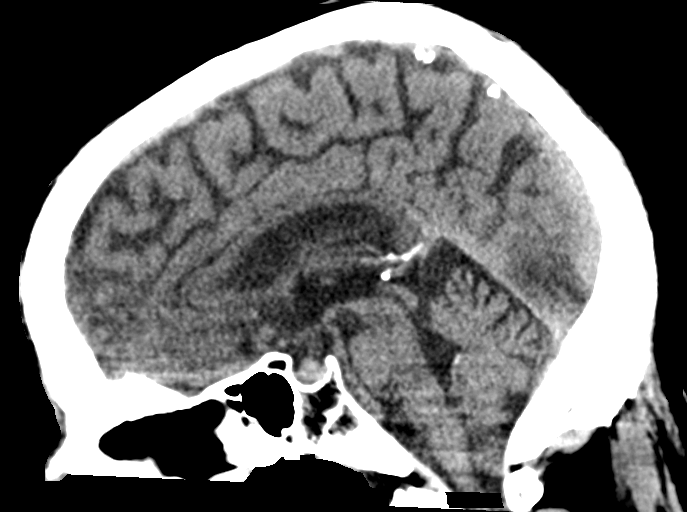
[im 38/57  brain]
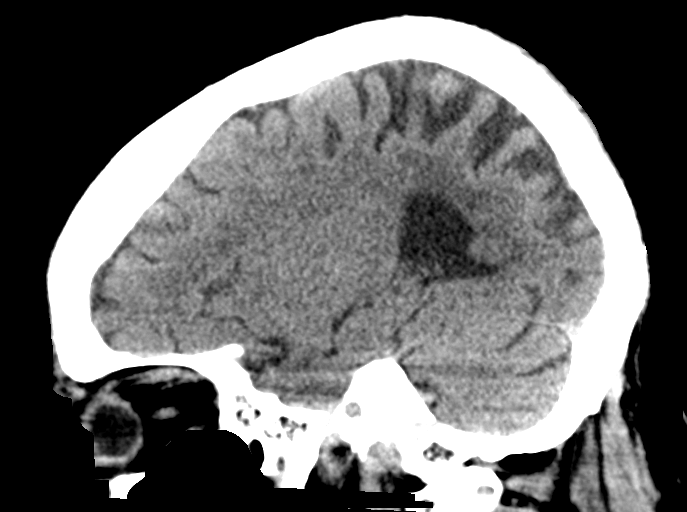

[15 of 47 positions shown; findings below may reference images not displayed]

FINDINGS: Brain: Mild atrophic changes and chronic white matter ischemic
changes are seen. No findings to suggest acute hemorrhage, acute
infarction or space-occupying mass lesion are noted.

Vascular: No hyperdense vessel or unexpected calcification.

Skull: Normal. Negative for fracture or focal lesion.

Sinuses/Orbits: No acute finding.

Other: Mild scalp hematoma is noted in the right posterior parietal
region near the vertex. Multiple small radiopaque densities are
noted likely related to foreign bodies. These are decreased when
compared with the prior exam.
IMPRESSION: Chronic atrophic and ischemic changes are noted.

Small right scalp hematoma as described with small foreign bodies
within. These are decreased in the interval from the prior exam.

No acute abnormality is noted.

## 2021-02-24 IMAGING — DX DG CHEST 1V PORT
1 series · 1 of 1 positions shown · non-contrast
Comparison: 10/31/2019

CLINICAL DATA: Syncope

EXAM:
PORTABLE CHEST 1 VIEW

[chest ap]
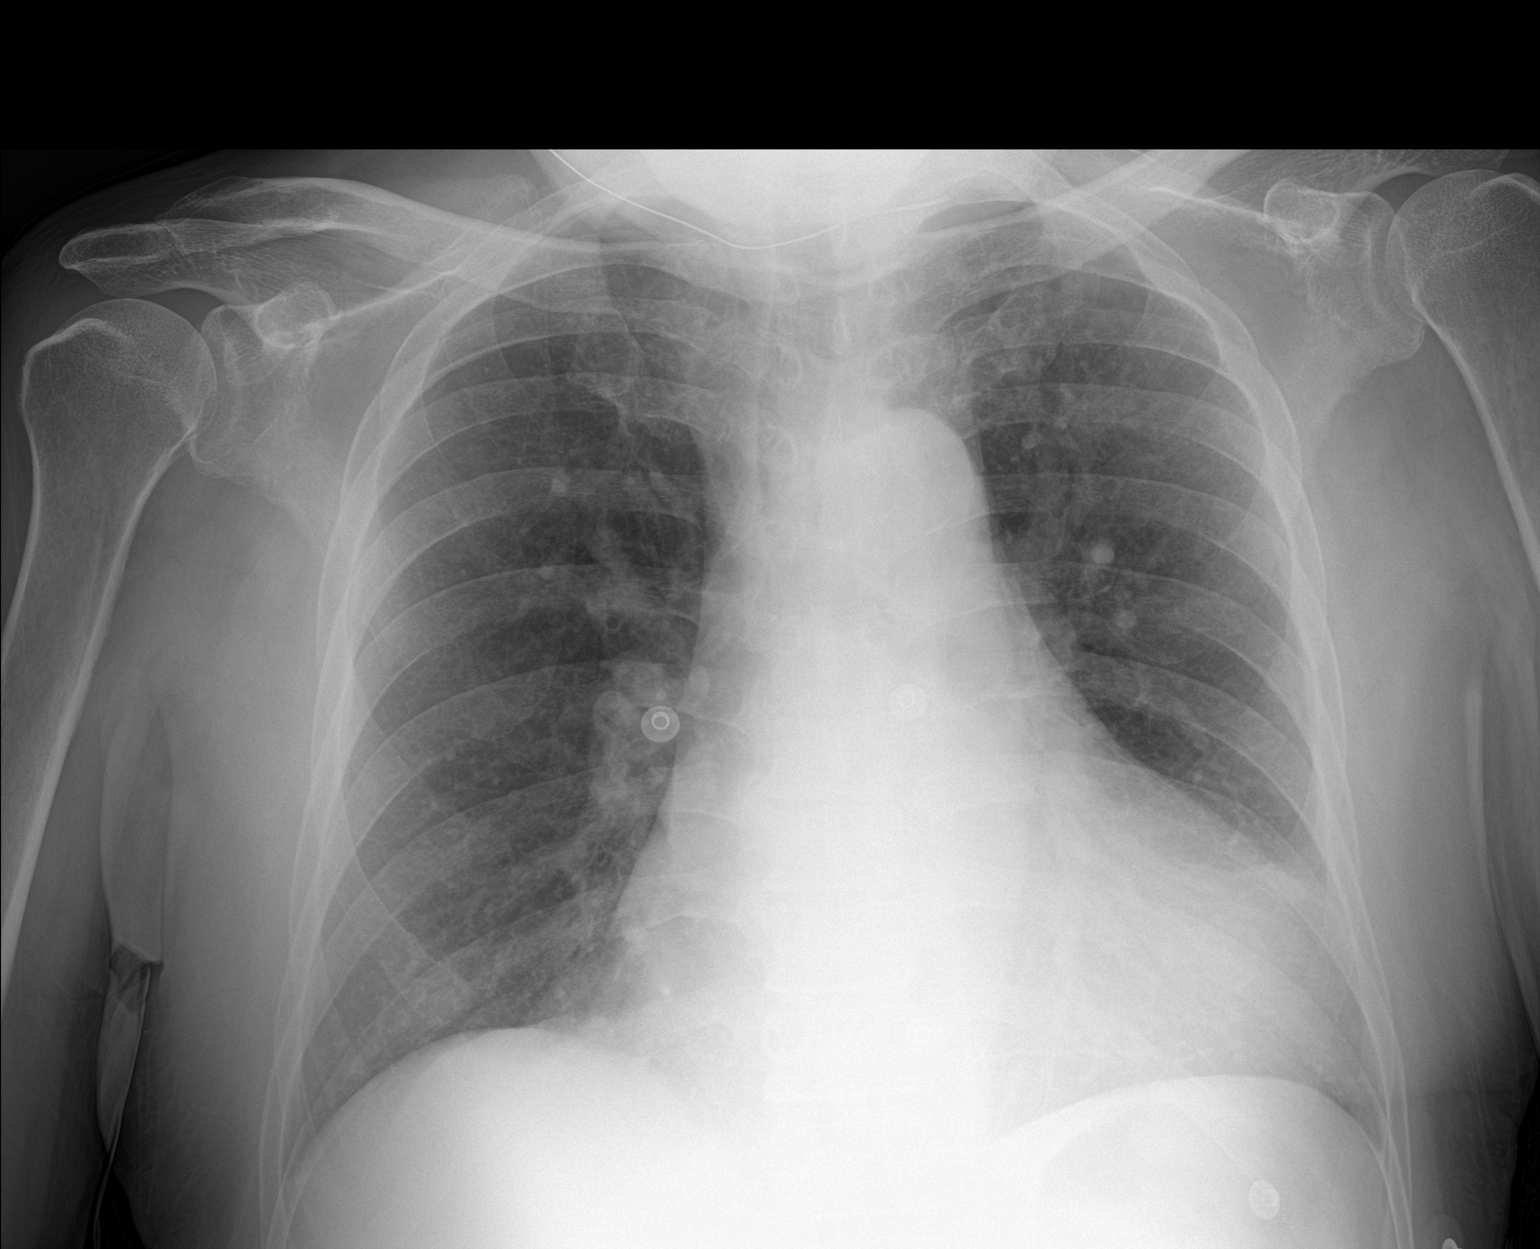

[1 of 1 positions shown; findings below may reference images not displayed]

FINDINGS: Cardiomegaly with mild central congestion. No consolidation, pleural
effusion, or pneumothorax.
IMPRESSION: No active disease.  Cardiomegaly with mild central congestion

## 2021-02-25 ENCOUNTER — Telehealth (HOSPITAL_COMMUNITY): Payer: Self-pay

## 2021-02-25 NOTE — Telephone Encounter (Signed)
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 PCP Dr. Margarita Rana is aware as well as Nurse Case Manager [name] at [facility]. Discharge complete and patient will be removed from list.

## 2021-03-08 IMAGING — DX DG CHEST 1V PORT
1 series · 1 of 1 positions shown · non-contrast
Comparison: 02/22/2020

CLINICAL DATA: Weakness

EXAM:
PORTABLE CHEST 1 VIEW

[chest ap]
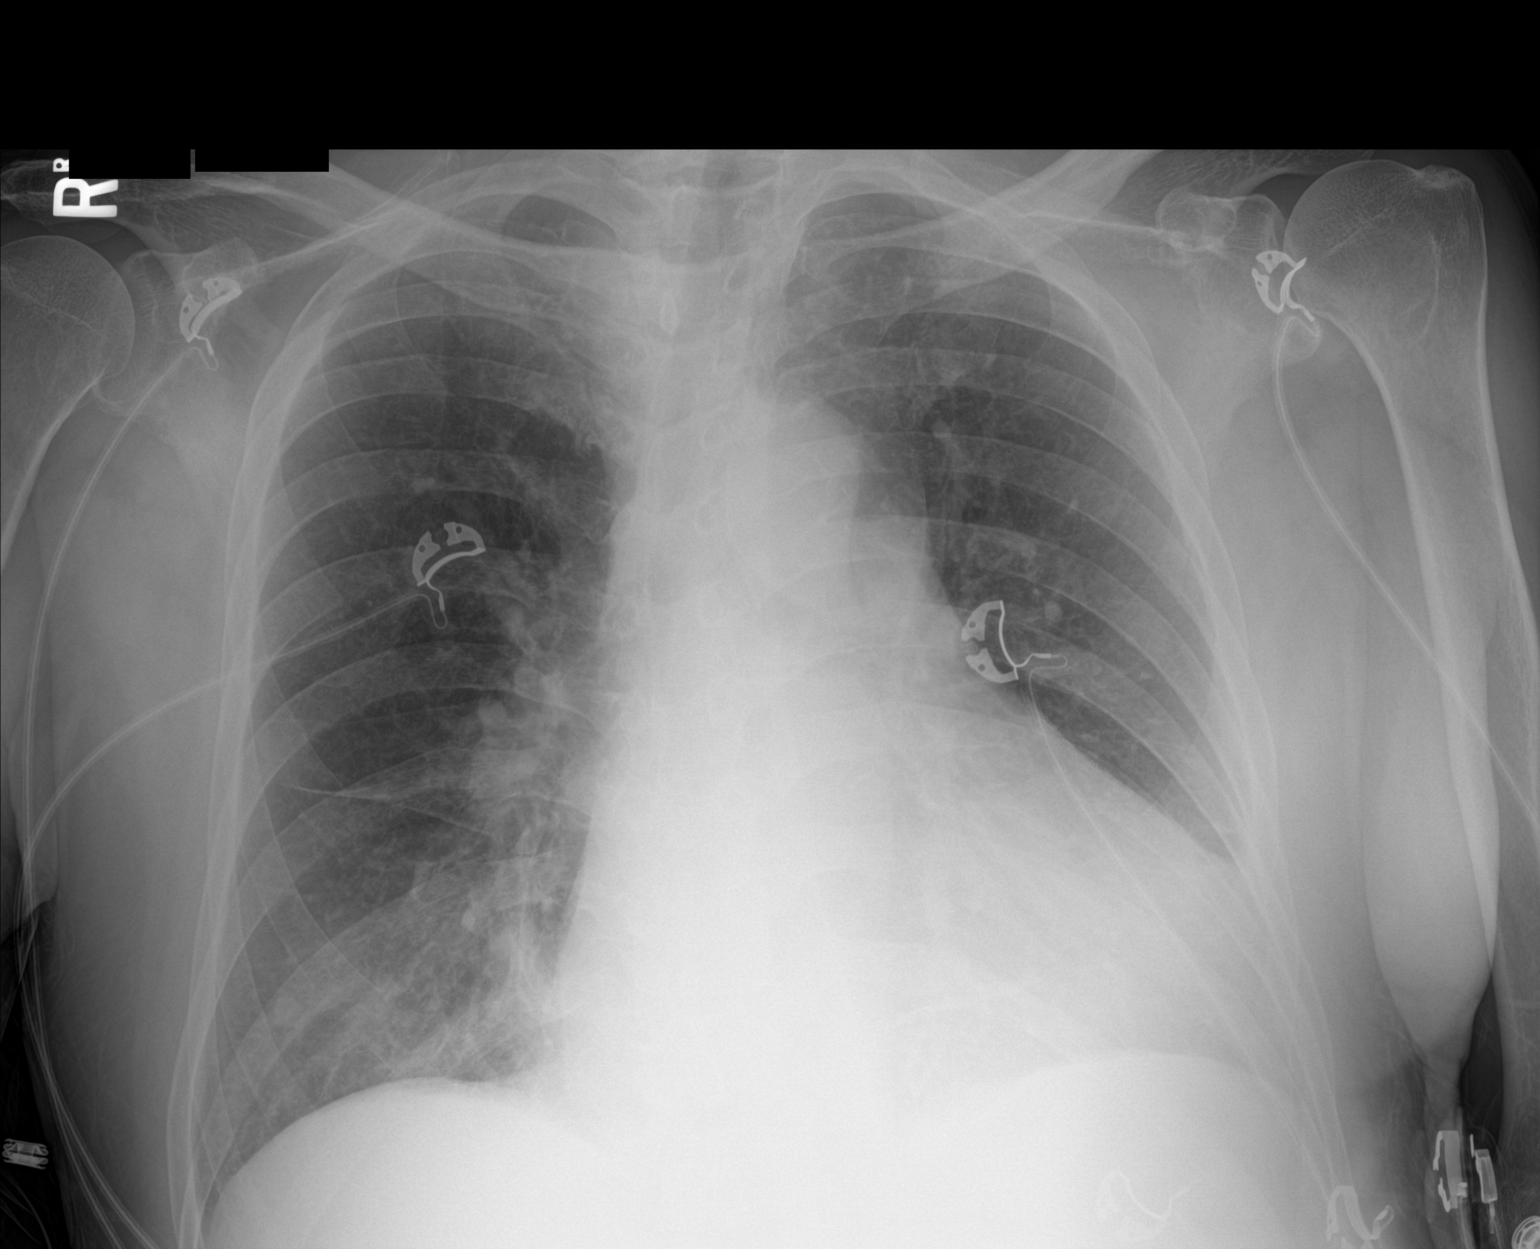

[1 of 1 positions shown; findings below may reference images not displayed]

FINDINGS: Cardiac enlargement with vascular congestion. No edema or effusion.
Mild bibasilar airspace disease has developed in the interval. No
acute skeletal abnormality
IMPRESSION: Mild vascular congestion without edema or effusion.

Mild bibasilar atelectasis/infiltrate.

## 2021-03-08 IMAGING — CT CT CERVICAL SPINE W/O CM
3 of 4 series · 13 of 33 positions shown, 16 images · non-contrast
Comparison: 02/03/2019

CLINICAL DATA: Multiple falls

EXAM:
CT CERVICAL SPINE WITHOUT CONTRAST
TECHNIQUE: Multidetector CT imaging of the cervical spine was performed without
intravenous contrast. Multiplanar CT image reconstructions were also
generated.

[Series 4: sagittal bone · sagittal · 0.30mm/px · 5 of 61 slices shown, 6 images]
[im 21/61  bone]
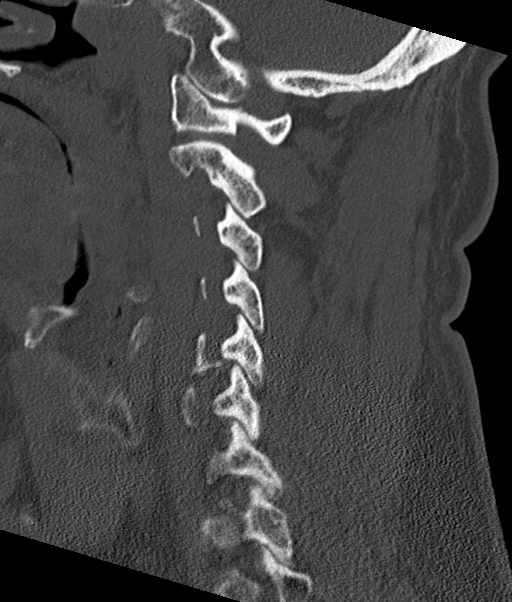
[im 26/61  bone]
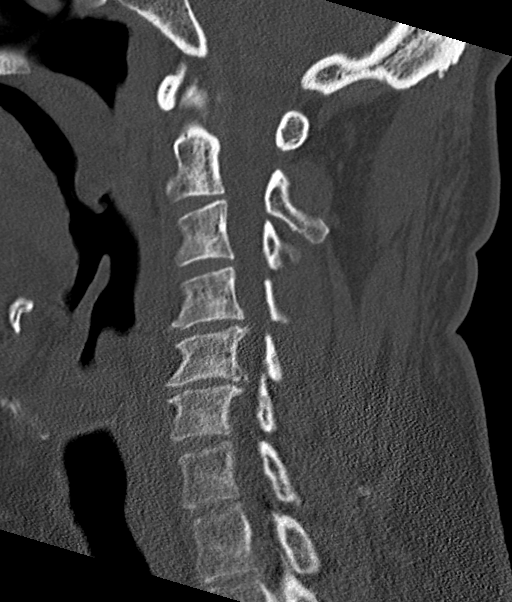
[im 31/61  soft-tissue]
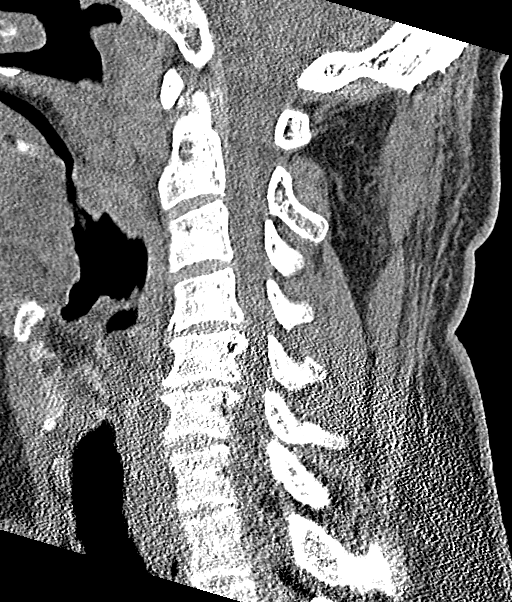
[im 31/61  bone]
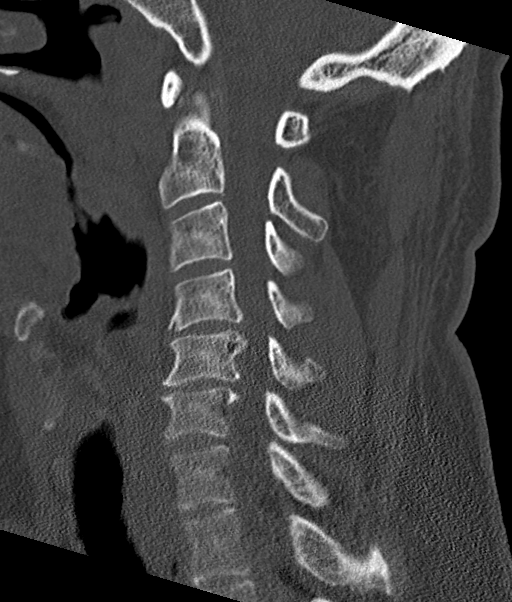
[im 36/61  bone]
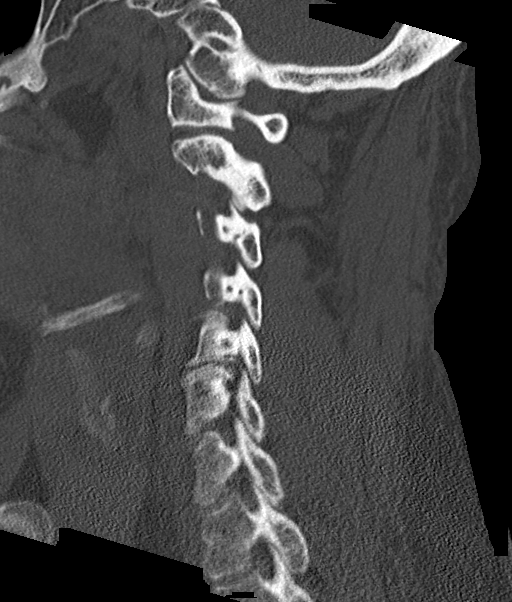
[im 41/61  bone]
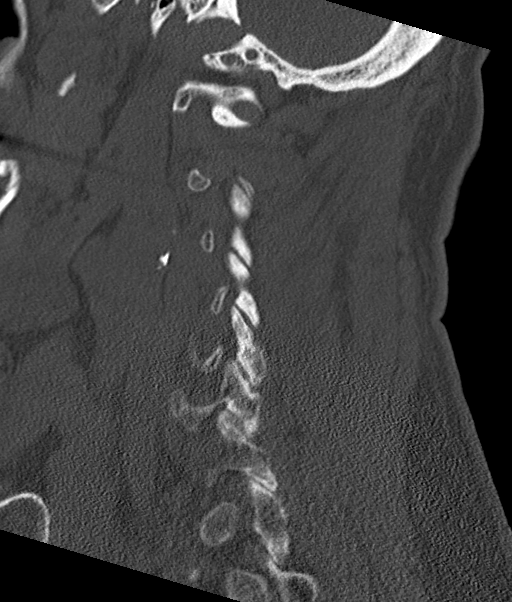

[Series 5: coronal bone · coronal · 0.26mm/px · 3 of 79 slices shown]
[im 17/79  bone]
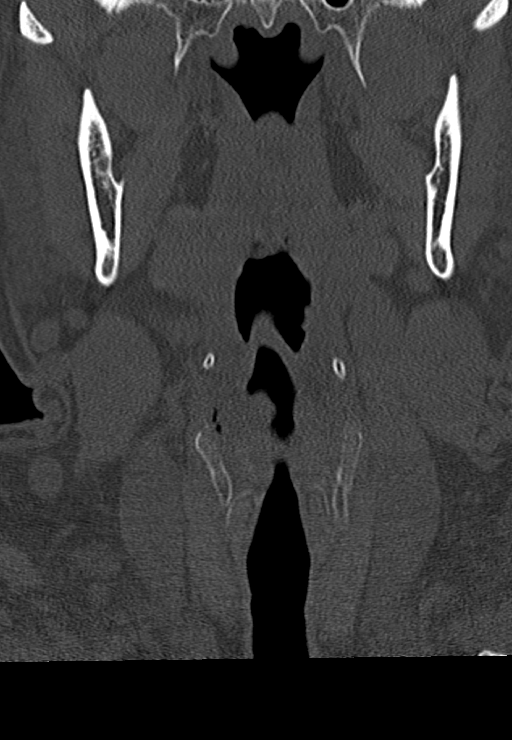
[im 32/79  bone]
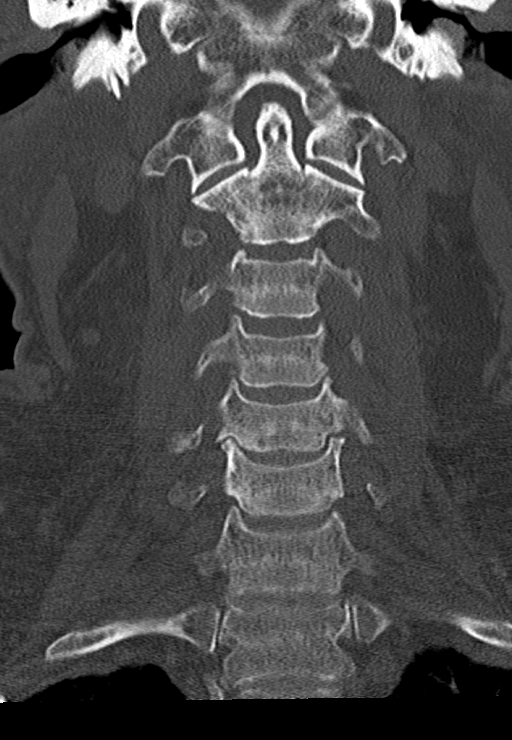
[im 47/79  bone]
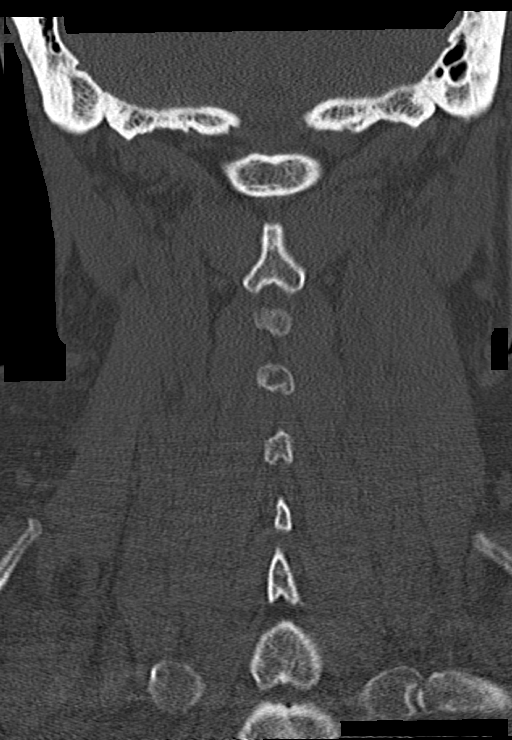

[Series 6: orthogonal bone · axial · 0.23mm/px · z∈[+1433,+1550]mm · 5 of 99 slices shown, 7 images]
[im 17/99  soft-tissue]
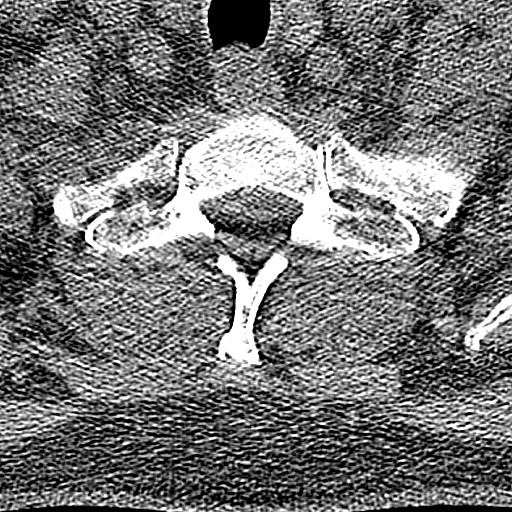
[im 17/99  bone]
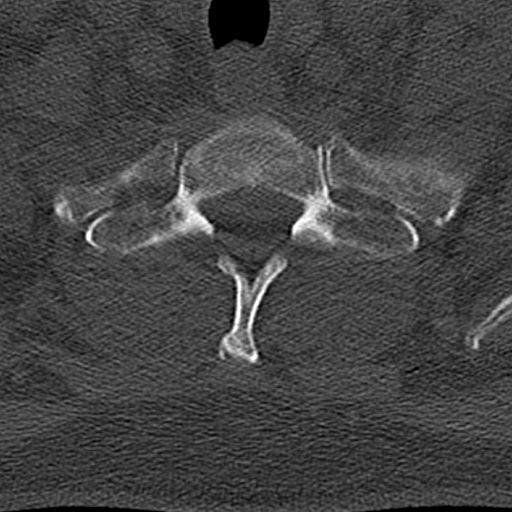
[im 33/99  bone]
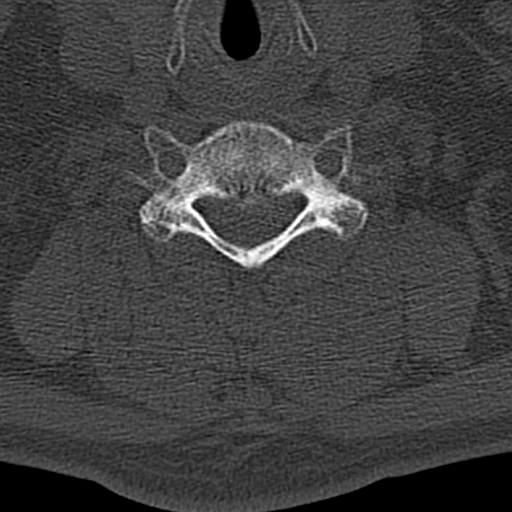
[im 50/99  bone]
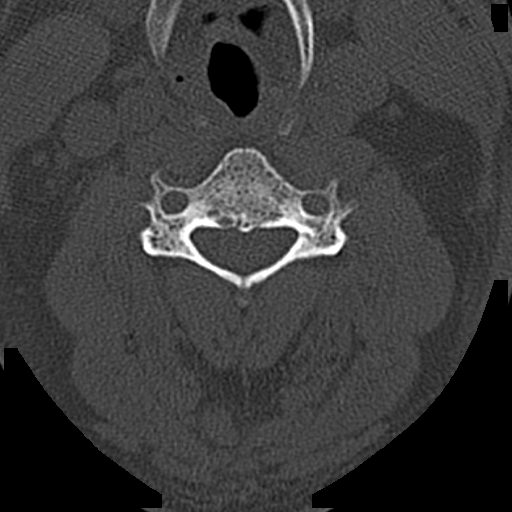
[im 66/99  bone]
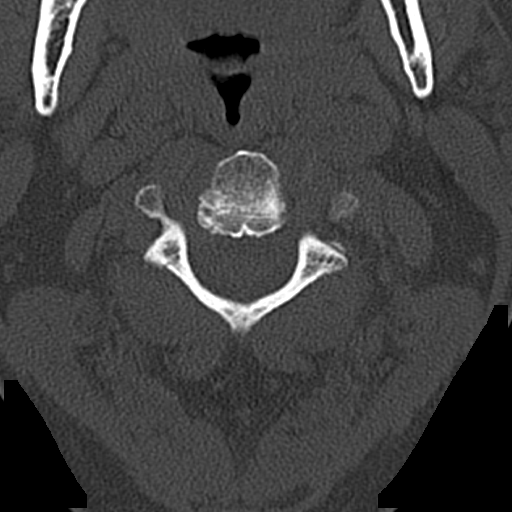
[im 82/99  soft-tissue]
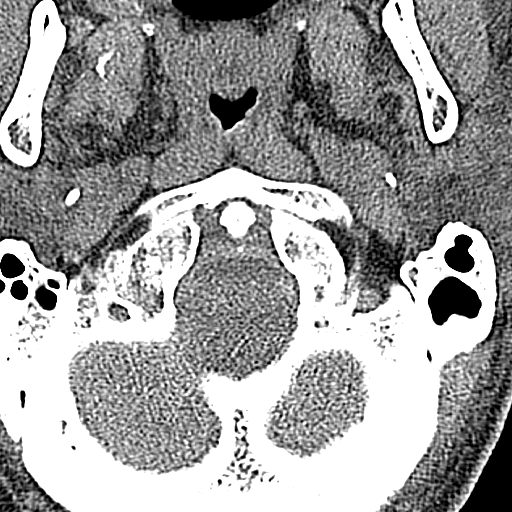
[im 82/99  bone]
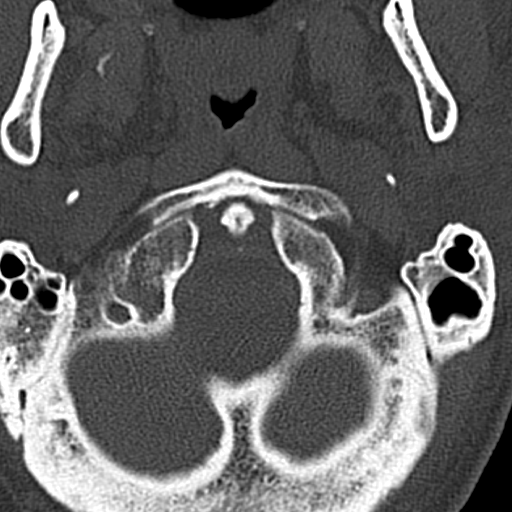

[13 of 33 positions shown; findings below may reference images not displayed]

FINDINGS: Alignment: Stable.

Skull base and vertebrae: No acute cervical spine fracture. Stable
vertebral body heights.

Soft tissues and spinal canal: No prevertebral fluid or swelling. No
visible canal hematoma.

Disc levels: Multilevel degenerative changes are stable over the
short interval.

Upper chest: Negative.

Other: Calcified plaque at the common carotid bifurcations.
IMPRESSION: No acute cervical spine fracture.

## 2021-03-08 IMAGING — CT CT HEAD W/O CM
3 of 4 series · 15 of 47 positions shown, 18 images · non-contrast
Comparison: 02/22/2020

CLINICAL DATA: Multiple falls

EXAM:
CT HEAD WITHOUT CONTRAST
TECHNIQUE: Contiguous axial images were obtained from the base of the skull
through the vertex without intravenous contrast.

[Series 2: head wo · axial · 0.46mm/px · z∈[+1595,+1730]mm · 9 of 33 slices shown, 12 images]
[im 3/33  brain]
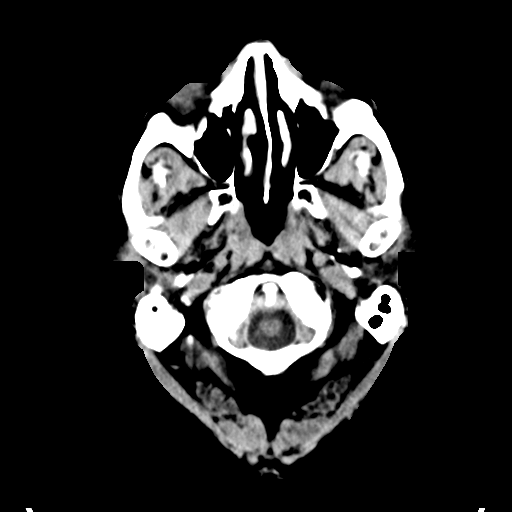
[im 3/33  bone]
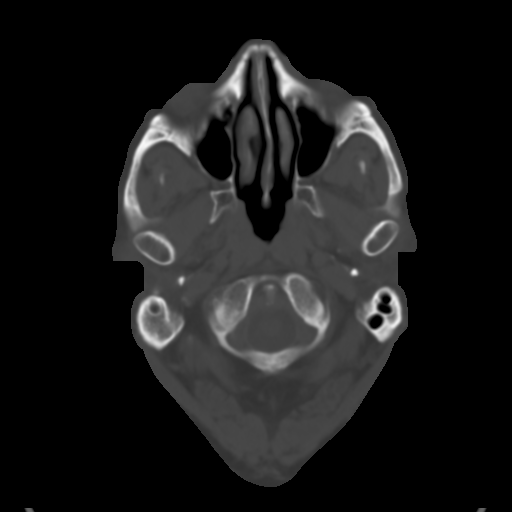
[im 7/33  brain]
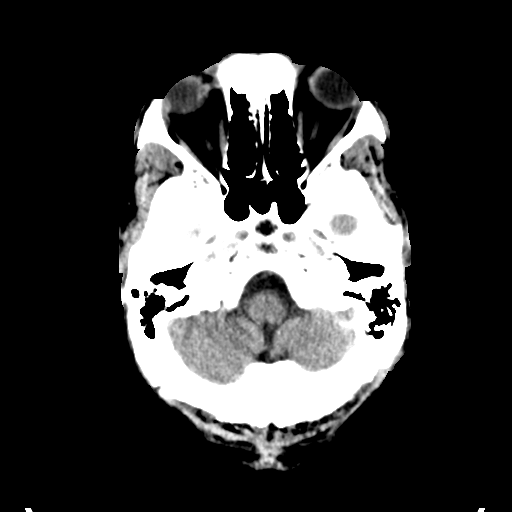
[im 10/33  brain]
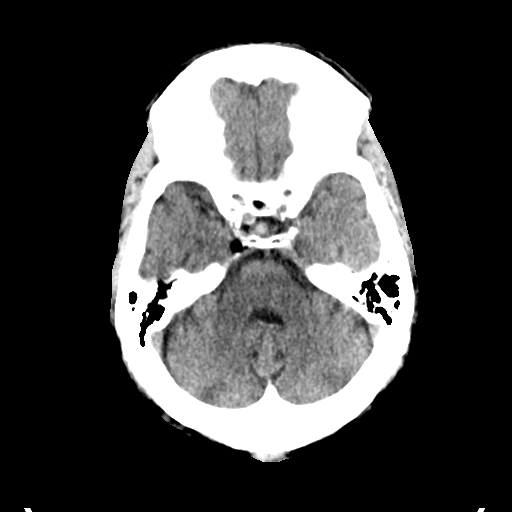
[im 14/33  brain]
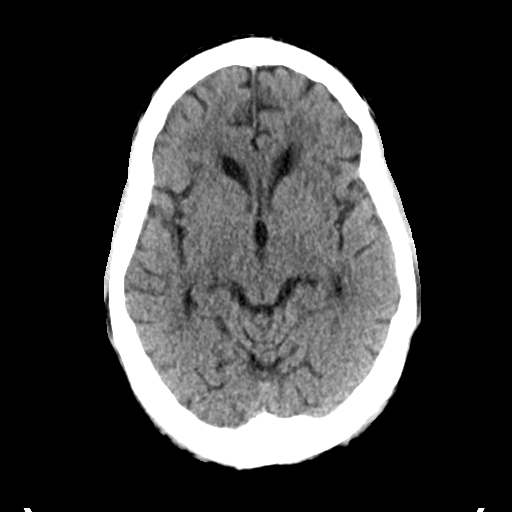
[im 17/33  brain]
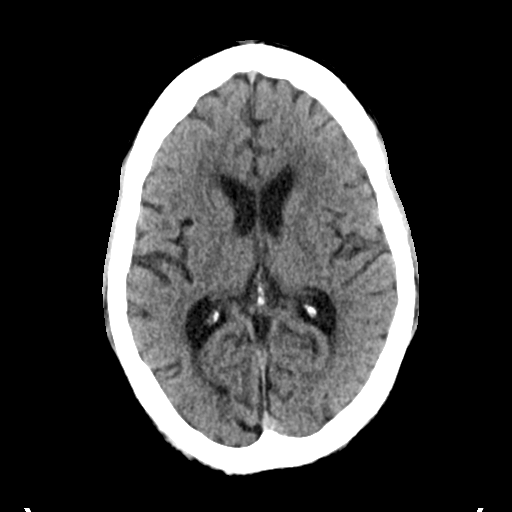
[im 17/33  bone]
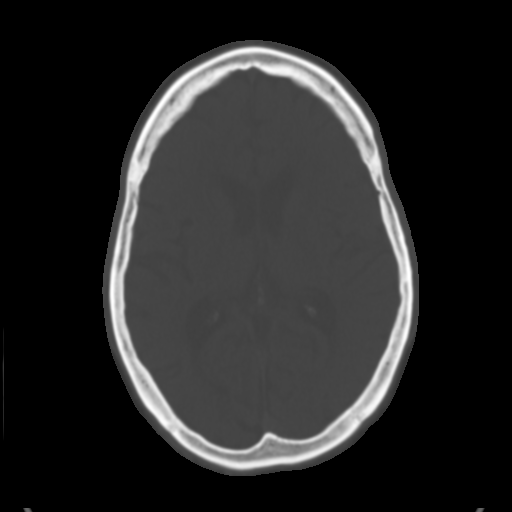
[im 19/33  brain]
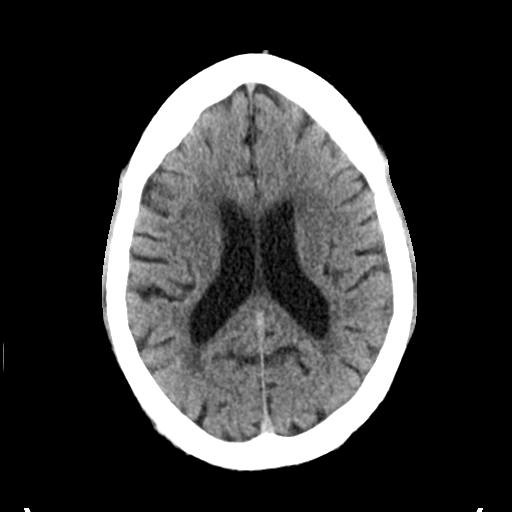
[im 23/33  brain]
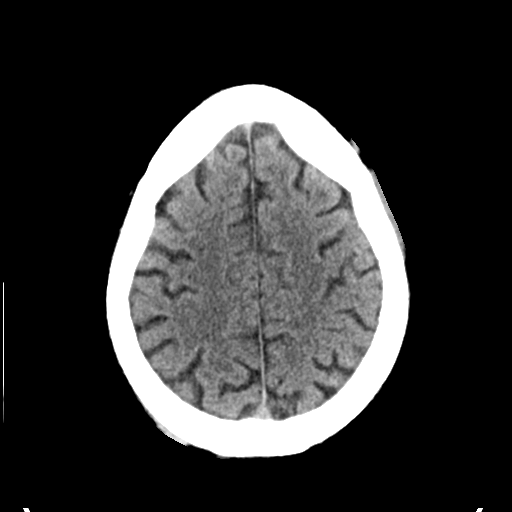
[im 26/33  brain]
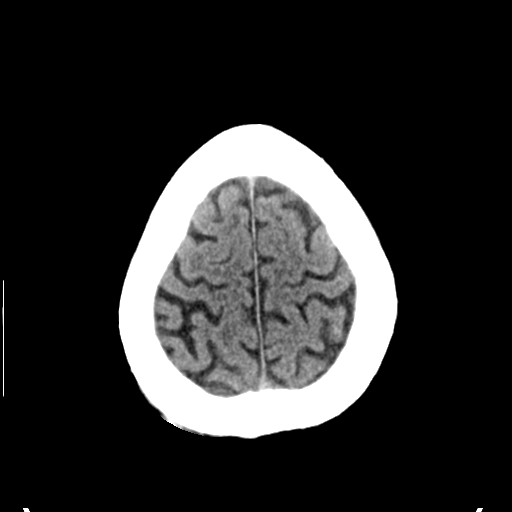
[im 30/33  brain]
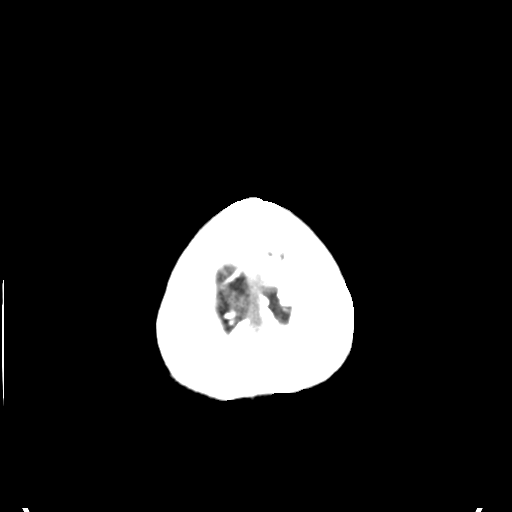
[im 30/33  bone]
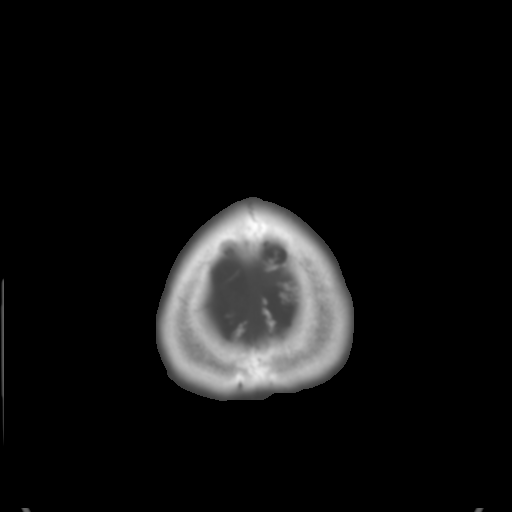

[Series 4: coronal soft tissue · coronal · 0.36mm/px · 3 of 77 slices shown]
[im 26/77  brain]
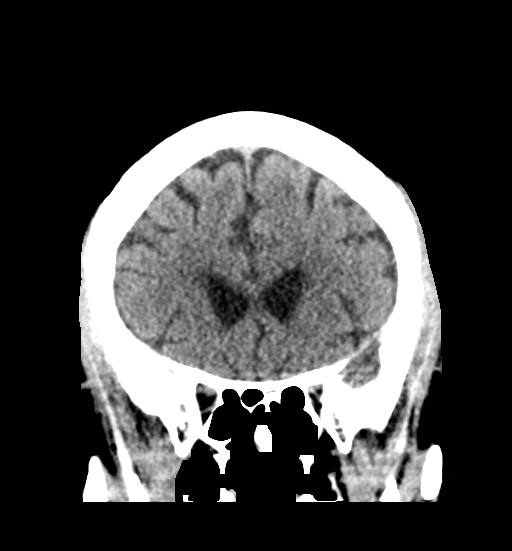
[im 34/77  brain]
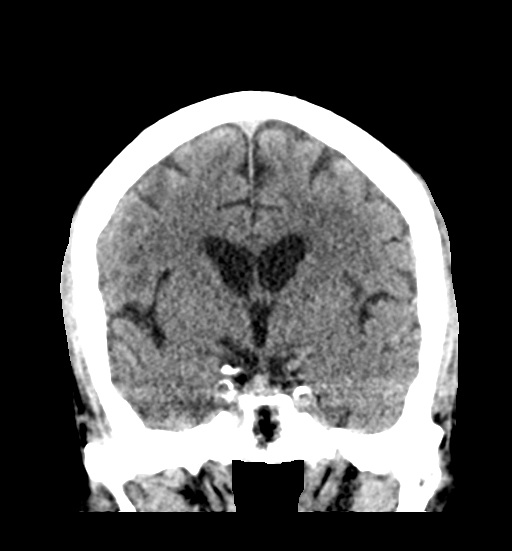
[im 43/77  brain]
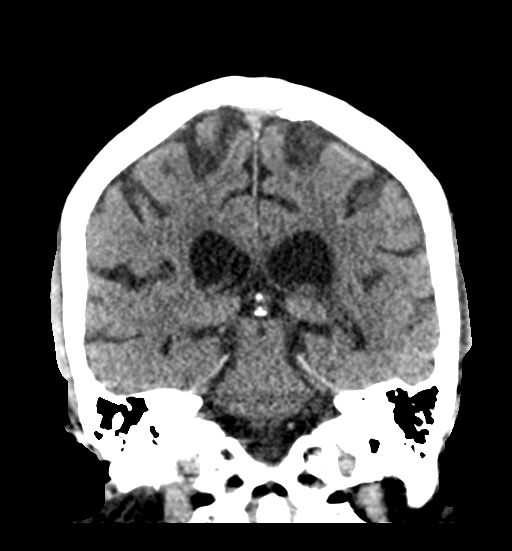

[Series 5: sagittal soft tissue · sagittal · 0.37mm/px · 3 of 60 slices shown]
[im 20/60  brain]
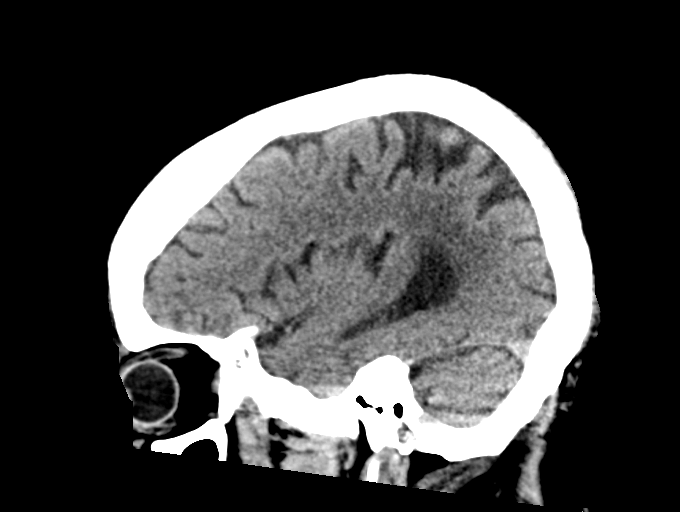
[im 30/60  brain]
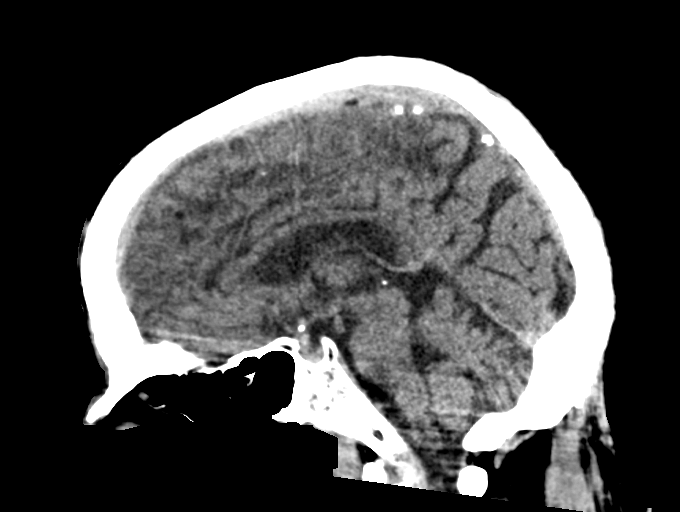
[im 40/60  brain]
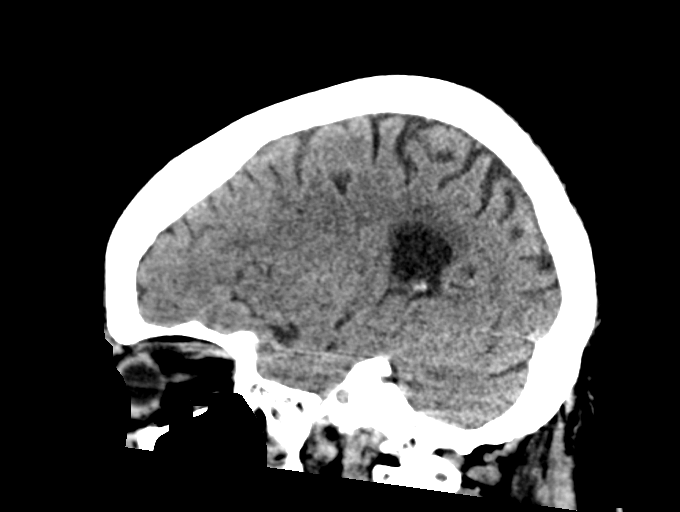

[15 of 47 positions shown; findings below may reference images not displayed]

FINDINGS: Brain: There is no acute intracranial hemorrhage, mass effect, or
edema. Gray-white differentiation is preserved. There is no
extra-axial fluid collection. Ventricles and sulci are stable in
size. Patchy hypoattenuation in the supratentorial white matter is
nonspecific but probably reflects stable chronic microvascular
ischemic changes.

Vascular: There is mild atherosclerotic calcification at the skull
base.

Skull: Calvarium is unremarkable.

Sinuses/Orbits: No acute finding.

Other: Left scalp soft tissue swelling. Suspected subcentimeter
hyperdense rounded lesion adjacent to the right aspect of the
infundibulum within the sella. This has likely been present on
multiple prior studies.
IMPRESSION: No evidence of acute intracranial injury.

Suspected subcentimeter hyperdense sellar lesion likely reflecting a
Rathke's cleft cyst.

## 2021-03-15 ENCOUNTER — Other Ambulatory Visit: Payer: Self-pay | Admitting: Family Medicine

## 2021-03-15 DIAGNOSIS — Z794 Long term (current) use of insulin: Secondary | ICD-10-CM

## 2021-03-15 MED ORDER — ACCU-CHEK GUIDE ME W/DEVICE KIT: PACK | 0 refills | Status: AC

## 2021-03-15 NOTE — Telephone Encounter (Signed)
Copied from [place] [id number]. Topic: Quick Communication - Rx Refill/Question >> Mar 15, 2021  4:28 PM Tessa Lerner A wrote: Medication: Blood Glucose Monitoring Suppl (ACCU-CHEK GUIDE ME) w/Device KIT   Has the patient contacted their pharmacy? Yes. Patient has contacted their pharmacy and been directed to contact their PCP  Preferred Pharmacy (with phone number or street name): [place], [place]  Phone:  [phone]  Agent: Please be advised that RX refills may take up to 3 business days. We ask that you follow-up with your pharmacy.

## 2021-03-19 IMAGING — DX DG CHEST 1V PORT
1 series · 1 of 1 positions shown · non-contrast
Comparison: Portable exam 2183 hours compared to 03/05/2020

CLINICAL DATA: Fall, diabetes mellitus, prostate cancer, CHF,
cirrhosis, hypertension, non ischemic cardiomyopathy, atrial flutter

EXAM:
PORTABLE CHEST 1 VIEW

[chest]
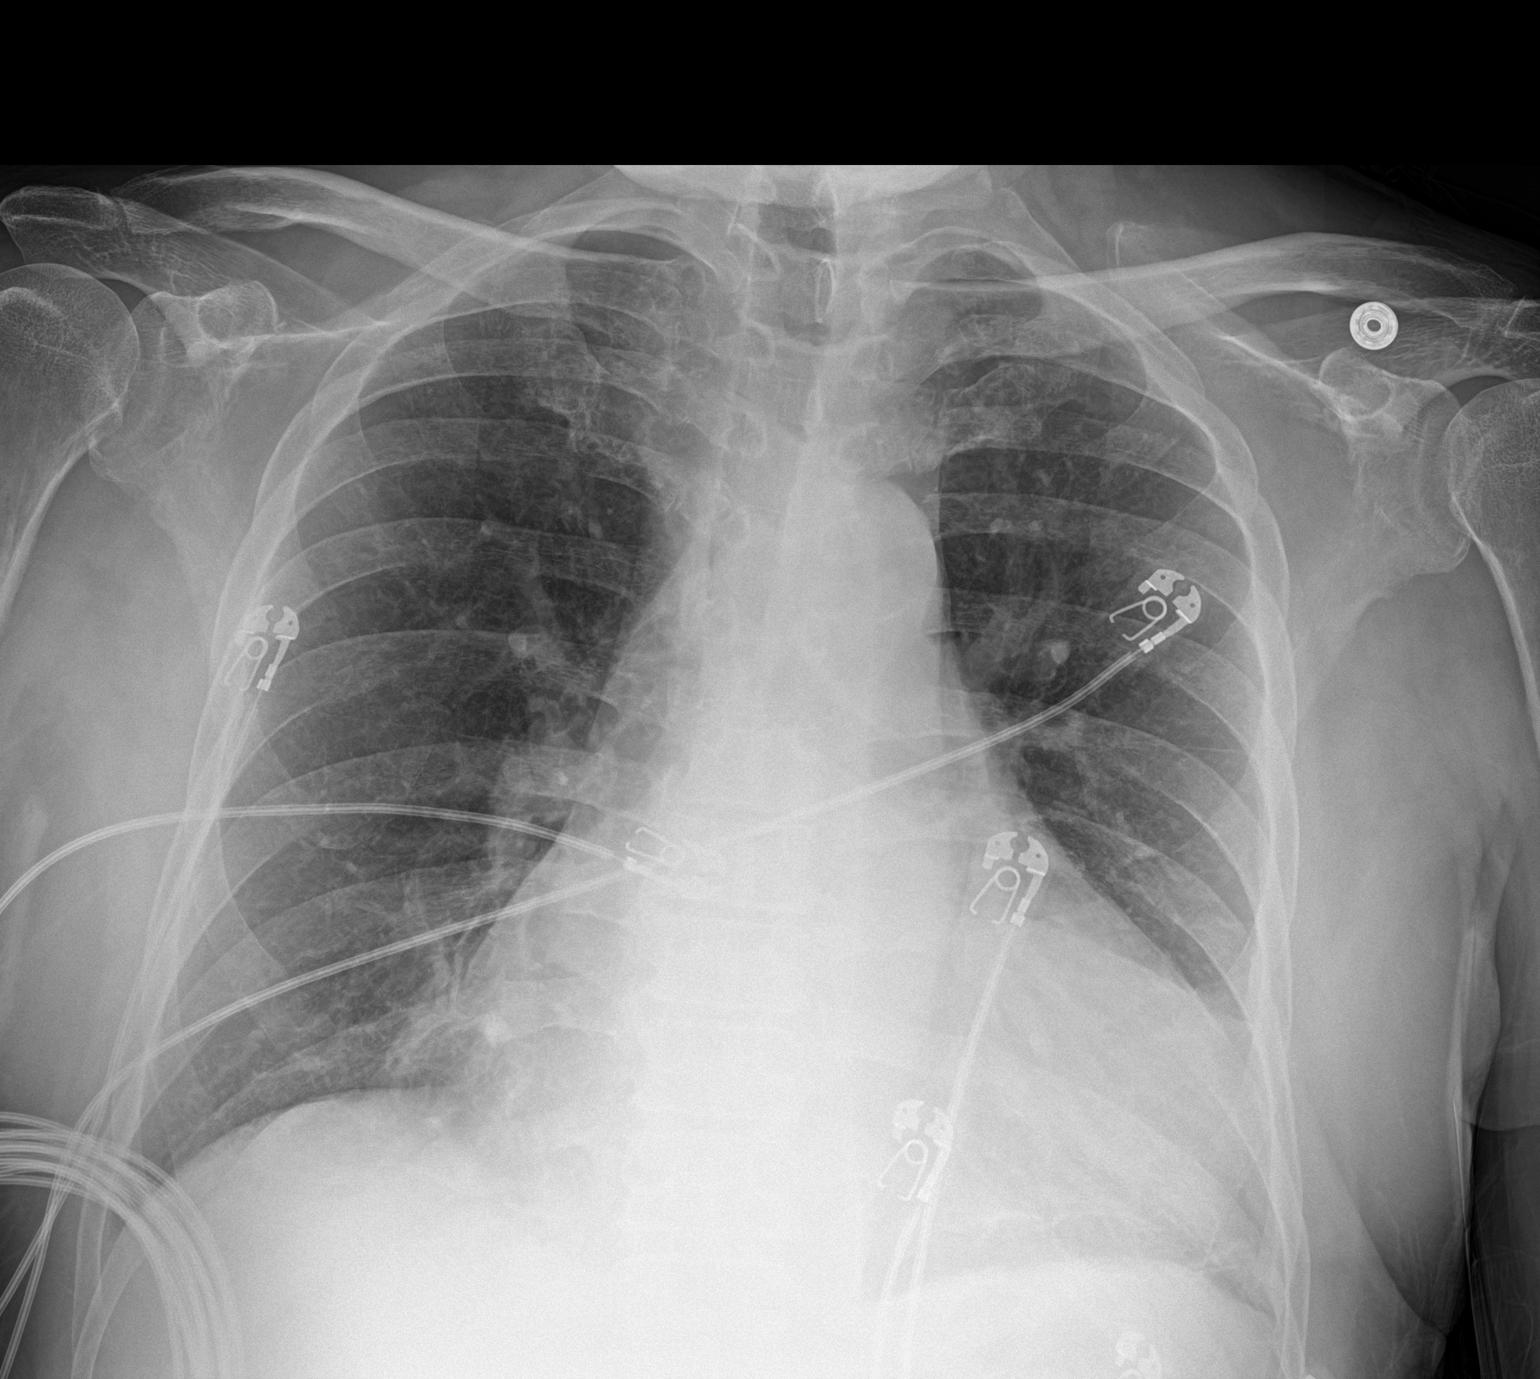

[1 of 1 positions shown; findings below may reference images not displayed]

FINDINGS: Enlargement of cardiac silhouette.

Slight pulmonary vascular congestion.

Atherosclerotic calcification aorta.

Minimal subsegmental atelectasis at bases.

Lungs otherwise clear.

No infiltrate, pleural effusion or pneumothorax.

Bones demineralized.
IMPRESSION: Enlargement of cardiac silhouette with slight pulmonary vascular
congestion.

Minimal bibasilar atelectasis without infiltrate.

## 2021-03-19 IMAGING — CT CT CERVICAL SPINE W/O CM
3 of 4 series · 12 of 35 positions shown, 14 images · non-contrast
Comparison: 03/05/2020

CLINICAL DATA: Recent fall with headaches and neck pain, initial
encounter

EXAM:
CT HEAD WITHOUT CONTRAST
CT CERVICAL SPINE WITHOUT CONTRAST
TECHNIQUE: Multidetector CT imaging of the head and cervical spine was
performed following the standard protocol without intravenous
contrast. Multiplanar CT image reconstructions of the cervical spine
were also generated.

[Series 8: sag bone · sagittal · 0.27mm/px · 5 of 83 slices shown, 6 images]
[im 28/83  bone]
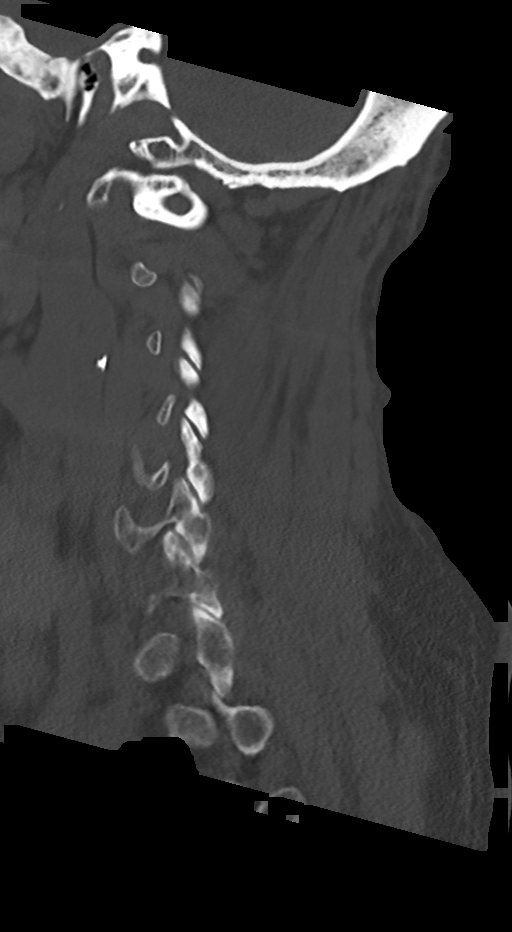
[im 35/83  bone]
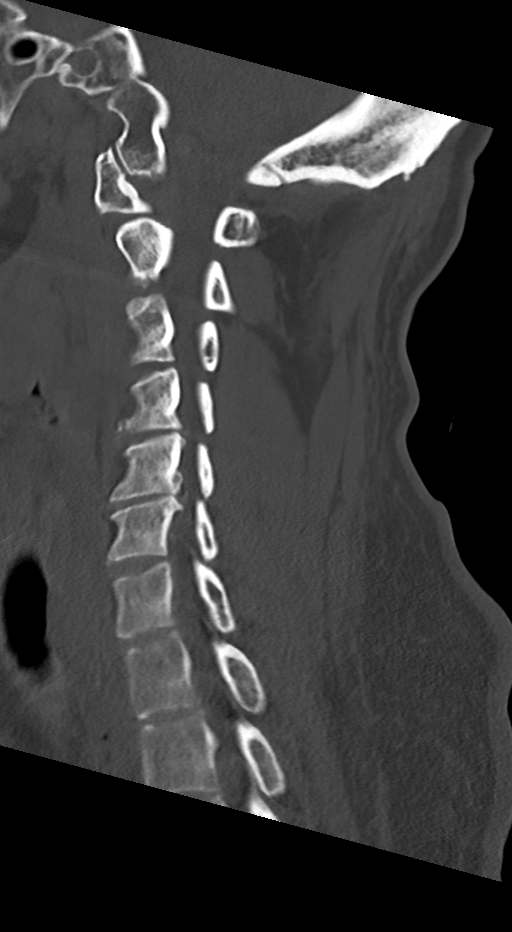
[im 42/83  soft-tissue]
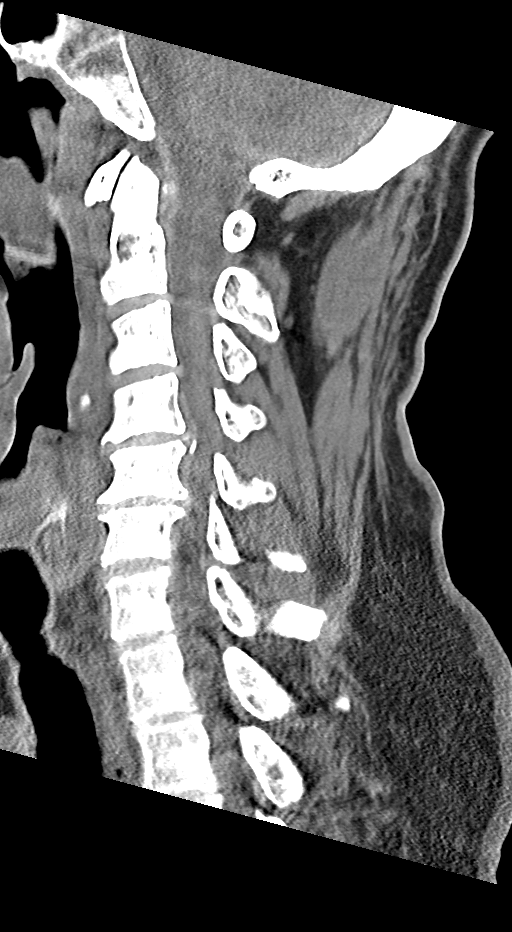
[im 42/83  bone]
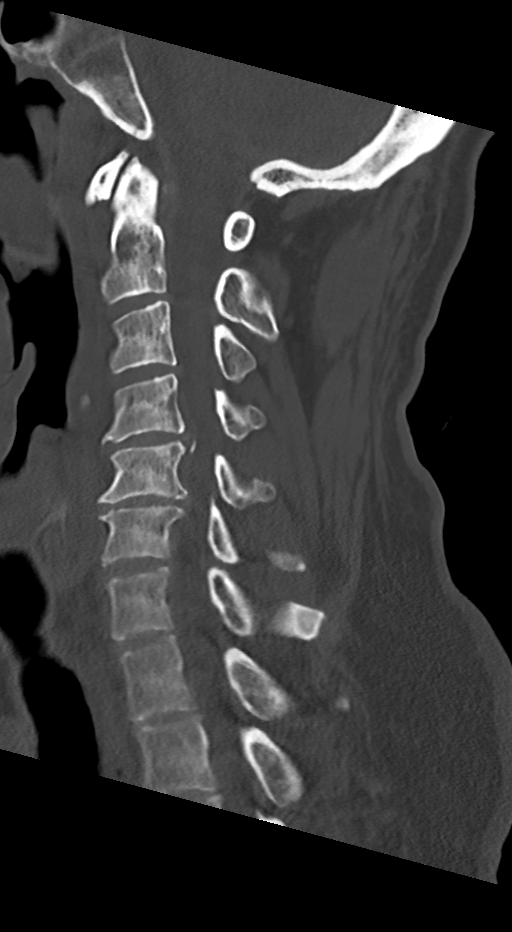
[im 48/83  bone]
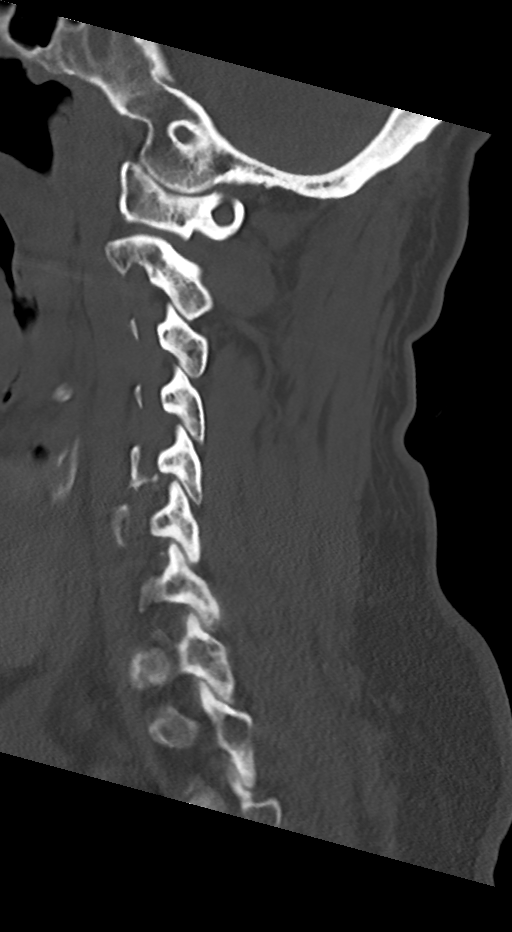
[im 55/83  bone]
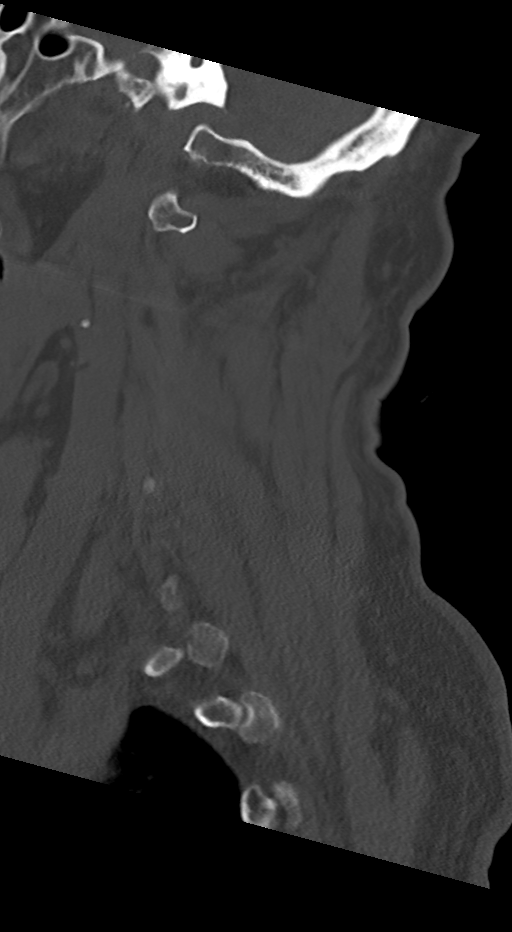

[Series 9: cor bone · coronal · 0.37mm/px · 3 of 77 slices shown]
[im 16/77  bone]
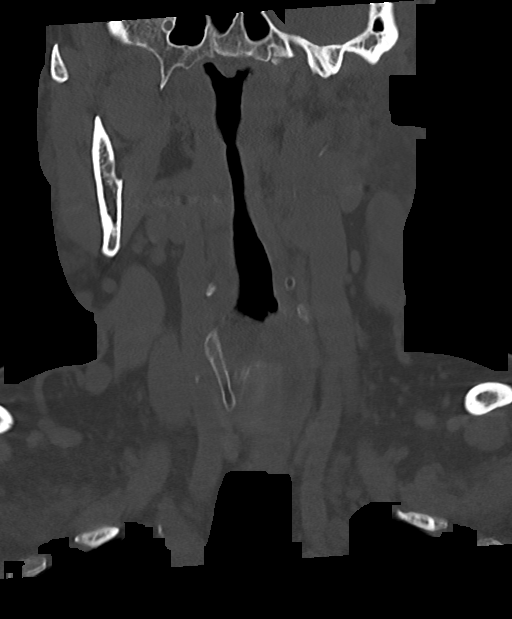
[im 31/77  bone]
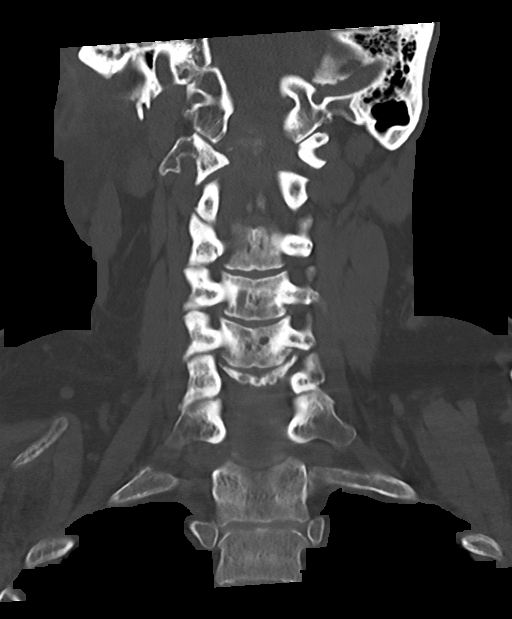
[im 46/77  bone]
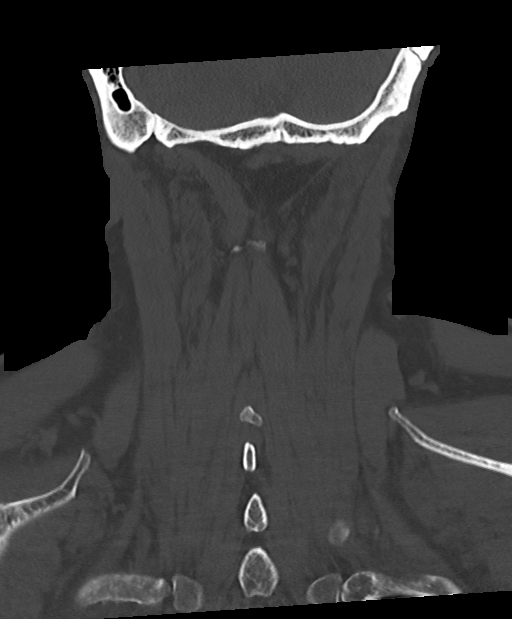

[Series 10: orthogonal axials · axial · 0.21mm/px · z∈[-269,-141]mm · 4 of 97 slices shown, 5 images]
[im 17/97  soft-tissue]
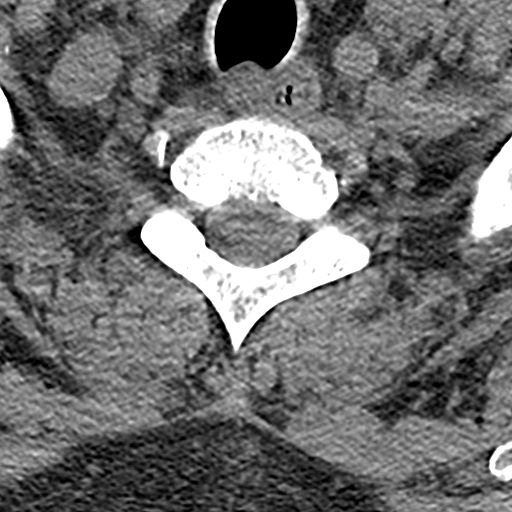
[im 17/97  bone]
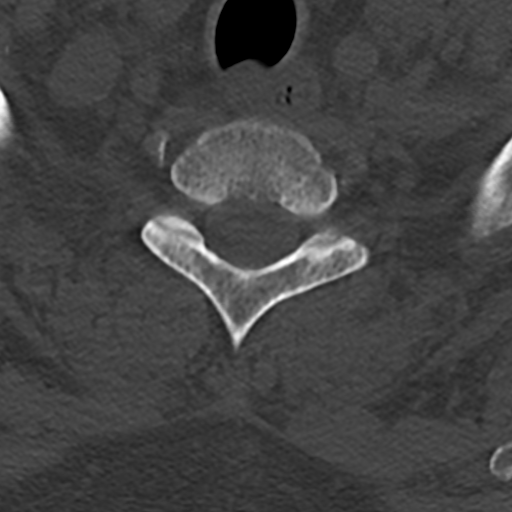
[im 33/97  bone]
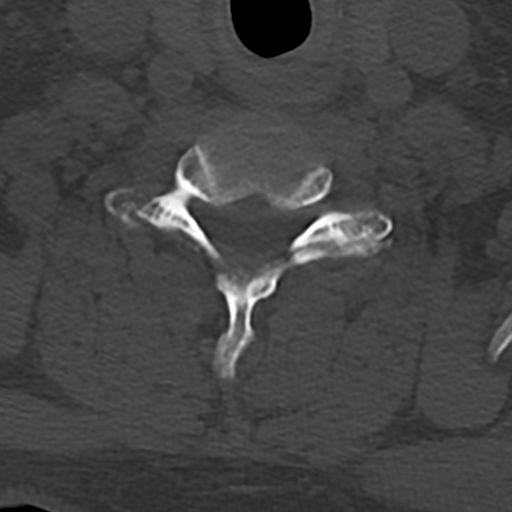
[im 65/97  bone]
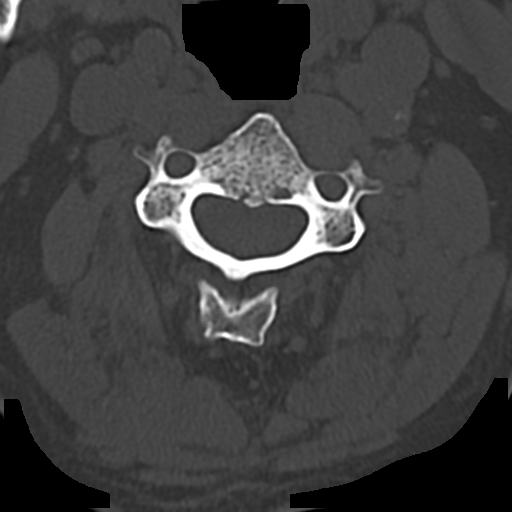
[im 81/97  bone]
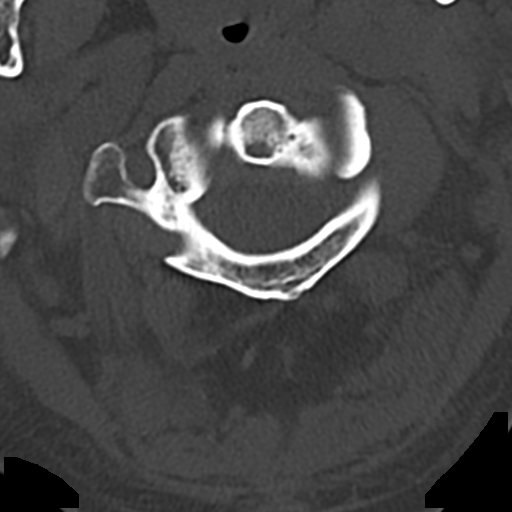

[12 of 35 positions shown; findings below may reference images not displayed]

FINDINGS: CT HEAD FINDINGS

Brain: Mild chronic white matter ischemic change is noted. No
findings to suggest acute hemorrhage, acute infarction or
space-occupying mass lesion are noted.

Vascular: No hyperdense vessel or unexpected calcification.

Skull: Normal. Negative for fracture or focal lesion.

Sinuses/Orbits: No acute finding.

Other: Mild left periorbital soft tissue swelling is noted
consistent with the recent injury.

CT CERVICAL SPINE FINDINGS

Alignment: Alignment is stable when compared with the prior exam.

Skull base and vertebrae: 7 cervical segments are well visualized.
Vertebral body height is well maintained. Mild osteophytic changes
and facet hypertrophic changes are noted. No acute fracture or acute
facet abnormality is seen.

Soft tissues and spinal canal: Surrounding soft tissue structures
are unremarkable.

Upper chest: Visualized lung apices are within normal limits.

Other: None
IMPRESSION: CT of the head: Chronic white matter ischemic changes without acute
abnormality.

CT of the cervical spine: Chronic degenerative changes without acute
abnormality.

## 2021-03-19 IMAGING — CT CT HEAD W/O CM
4 series · 16 of 47 positions shown, 18 images · non-contrast
Comparison: 03/05/2020

CLINICAL DATA: Recent fall with headaches and neck pain, initial
encounter

EXAM:
CT HEAD WITHOUT CONTRAST
CT CERVICAL SPINE WITHOUT CONTRAST
TECHNIQUE: Multidetector CT imaging of the head and cervical spine was
performed following the standard protocol without intravenous
contrast. Multiplanar CT image reconstructions of the cervical spine
were also generated.

[Series 3: head wo · axial · 0.44mm/px · z∈[-124,+6]mm · 7 of 36 slices shown, 9 images]
[im 5/36  brain]
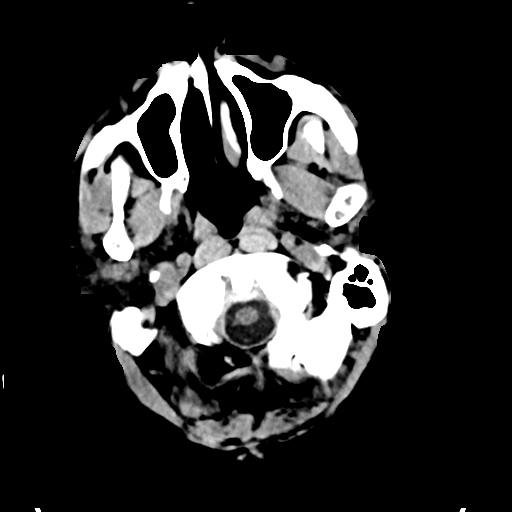
[im 5/36  bone]
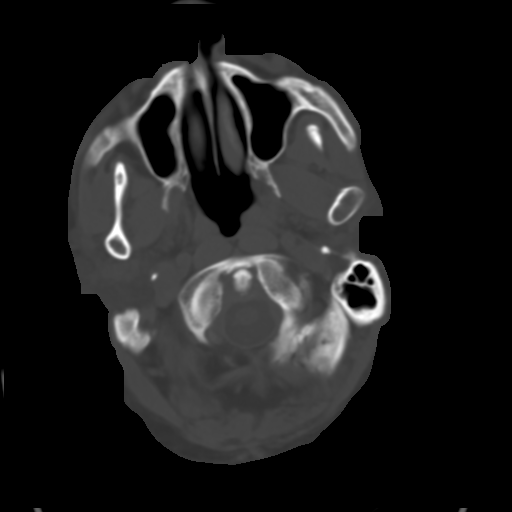
[im 9/36  brain]
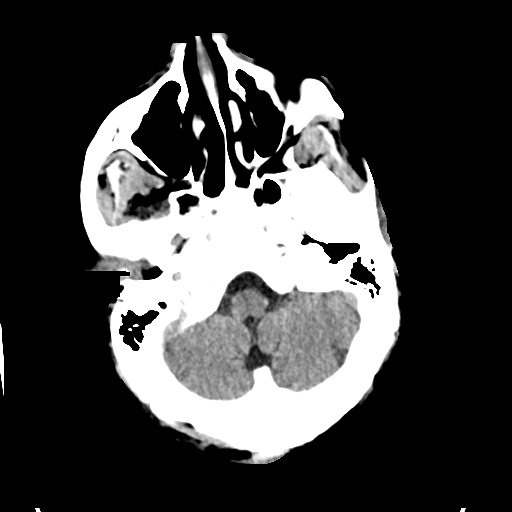
[im 14/36  brain]
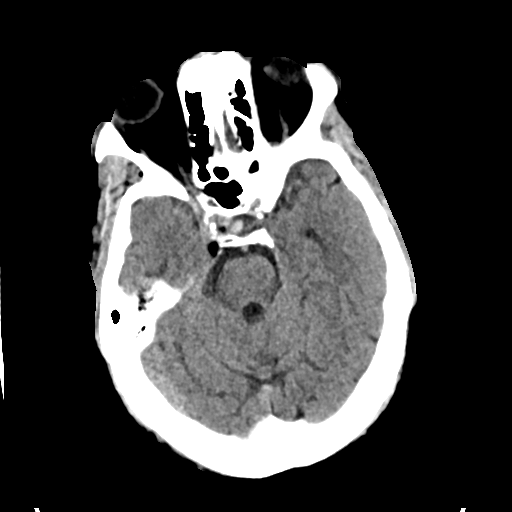
[im 18/36  brain]
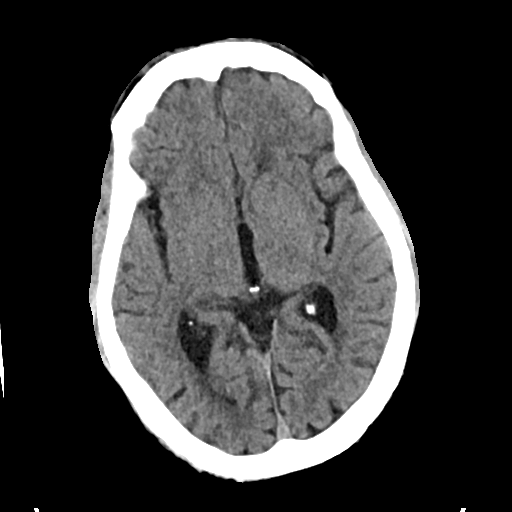
[im 22/36  brain]
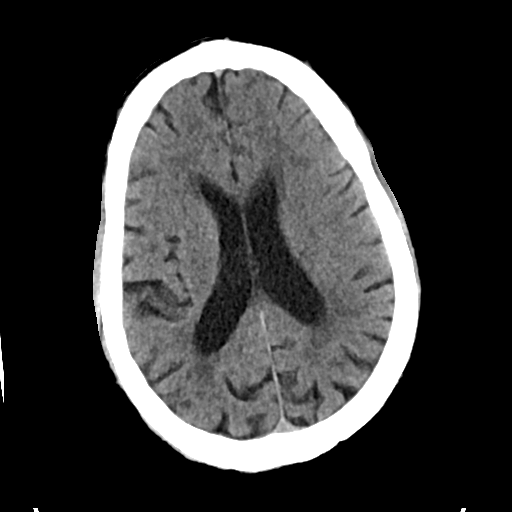
[im 22/36  bone]
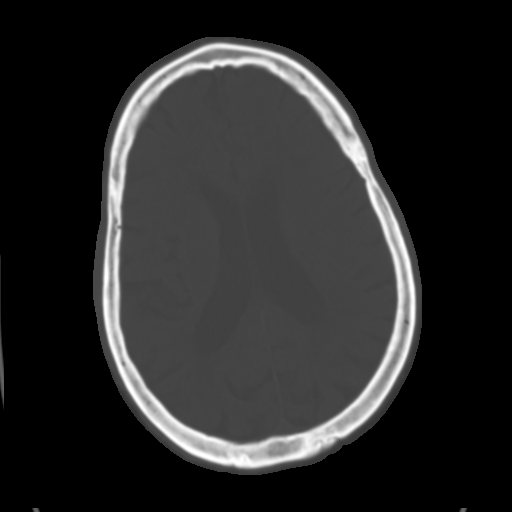
[im 27/36  brain]
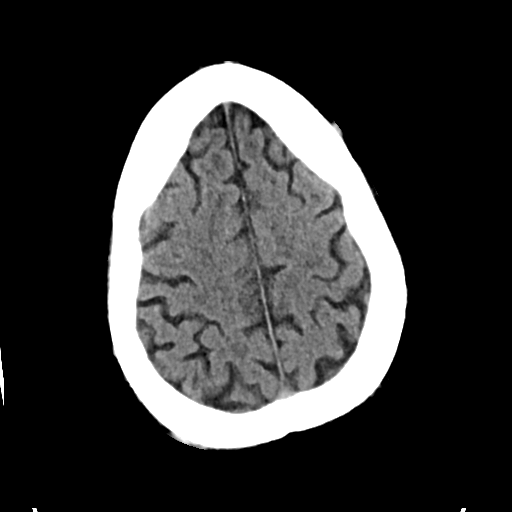
[im 31/36  brain]
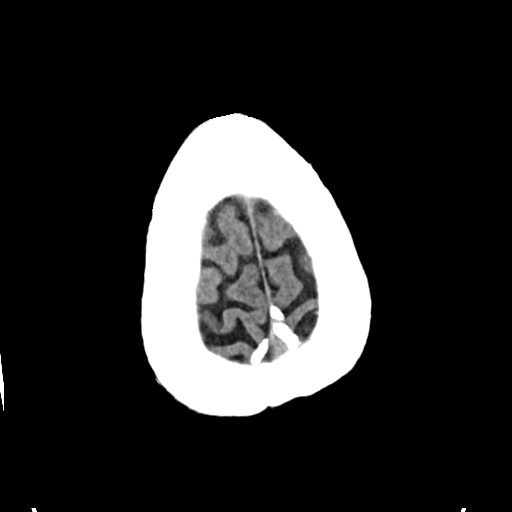

[Series 4: head bone · axial · 0.44mm/px · z∈[-128,-92]mm · 3 of 90 slices shown]
[im 9/90  bone]
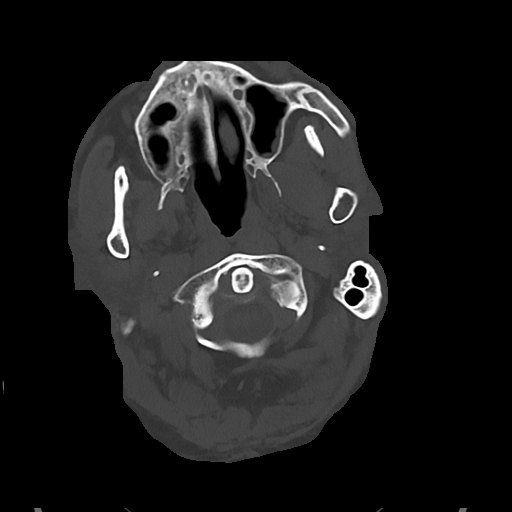
[im 18/90  bone]
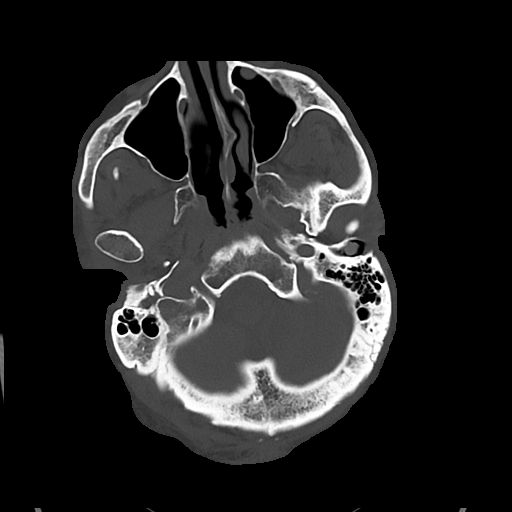
[im 27/90  bone]
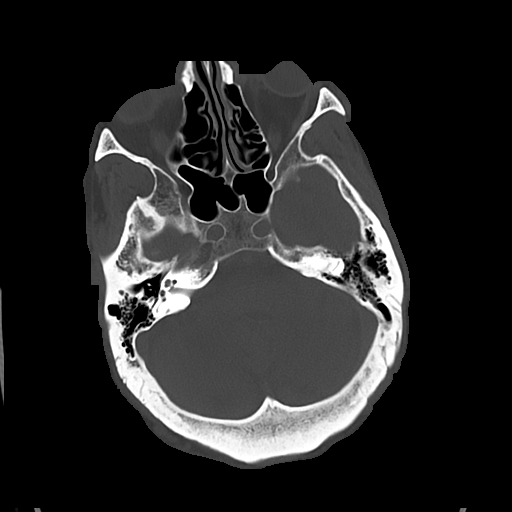

[Series 5: cor soft · coronal · 0.34mm/px · 3 of 73 slices shown]
[im 25/73  brain]
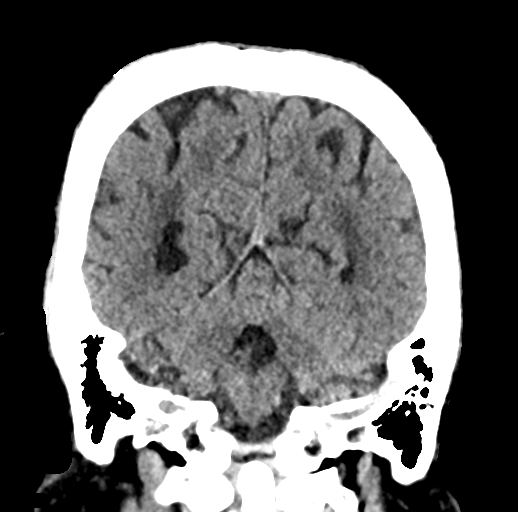
[im 33/73  brain]
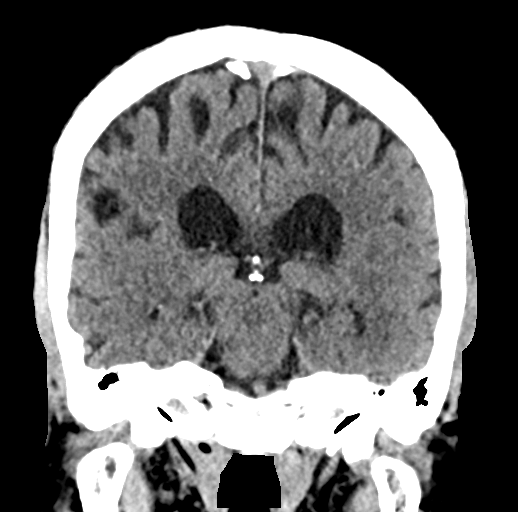
[im 40/73  brain]
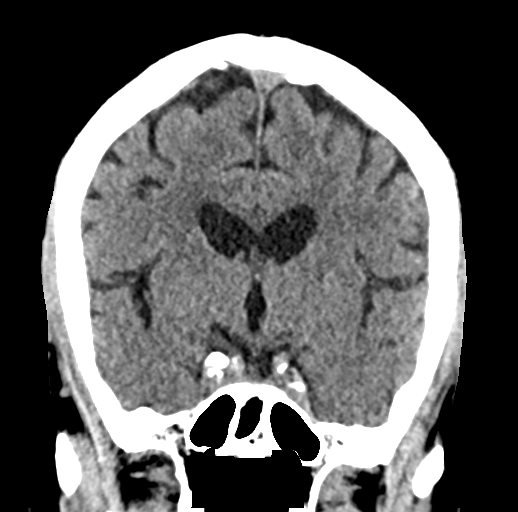

[Series 6: sag soft · sagittal · 0.34mm/px · 3 of 53 slices shown]
[im 18/53  brain]
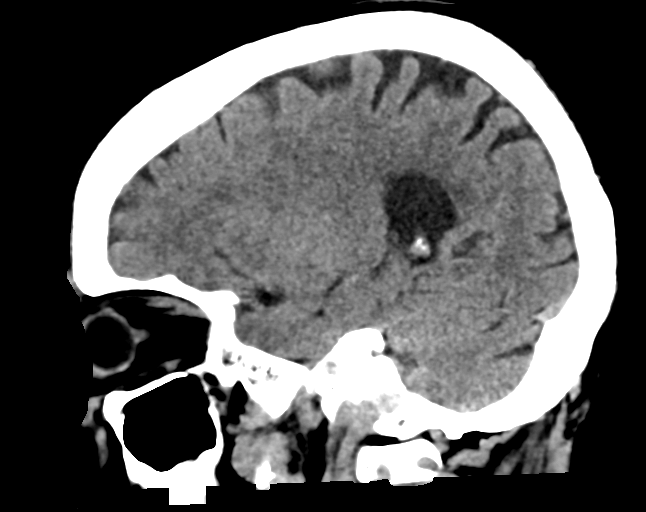
[im 27/53  brain]
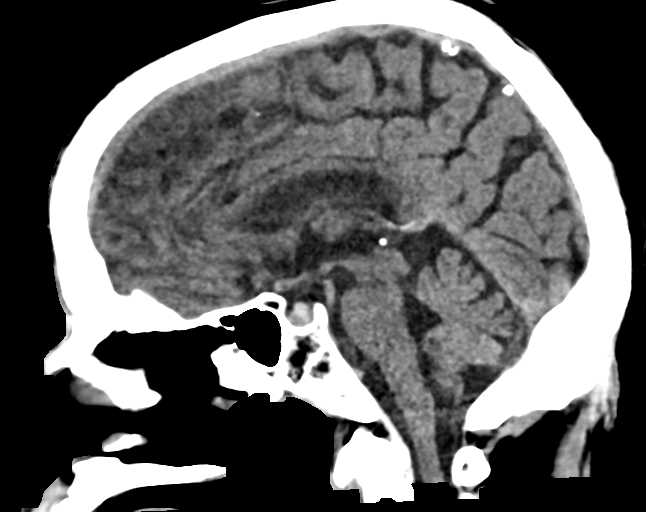
[im 35/53  brain]
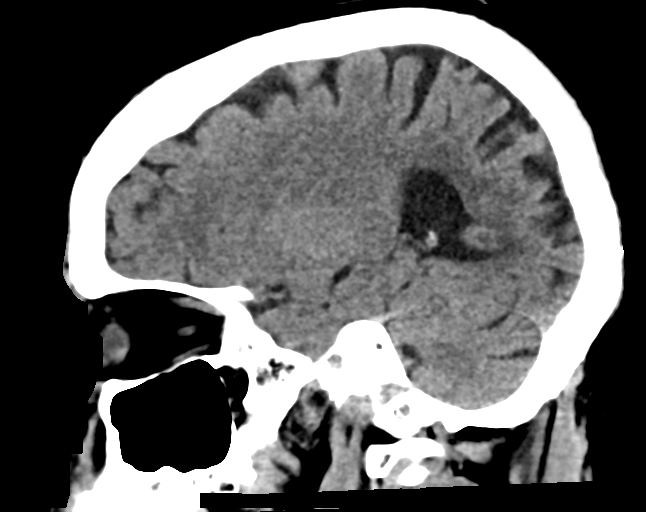

[16 of 47 positions shown; findings below may reference images not displayed]

FINDINGS: CT HEAD FINDINGS

Brain: Mild chronic white matter ischemic change is noted. No
findings to suggest acute hemorrhage, acute infarction or
space-occupying mass lesion are noted.

Vascular: No hyperdense vessel or unexpected calcification.

Skull: Normal. Negative for fracture or focal lesion.

Sinuses/Orbits: No acute finding.

Other: Mild left periorbital soft tissue swelling is noted
consistent with the recent injury.

CT CERVICAL SPINE FINDINGS

Alignment: Alignment is stable when compared with the prior exam.

Skull base and vertebrae: 7 cervical segments are well visualized.
Vertebral body height is well maintained. Mild osteophytic changes
and facet hypertrophic changes are noted. No acute fracture or acute
facet abnormality is seen.

Soft tissues and spinal canal: Surrounding soft tissue structures
are unremarkable.

Upper chest: Visualized lung apices are within normal limits.

Other: None
IMPRESSION: CT of the head: Chronic white matter ischemic changes without acute
abnormality.

CT of the cervical spine: Chronic degenerative changes without acute
abnormality.

## 2021-03-21 ENCOUNTER — Other Ambulatory Visit: Payer: Self-pay | Admitting: Family Medicine

## 2021-03-21 DIAGNOSIS — E1140 Type 2 diabetes mellitus with diabetic neuropathy, unspecified: Secondary | ICD-10-CM

## 2021-03-26 IMAGING — DX DG CHEST 1V PORT
1 series · 1 of 1 positions shown · non-contrast
Comparison: March 16, 2020

CLINICAL DATA: Pain following fall

EXAM:
PORTABLE CHEST 1 VIEW

[chest ap]
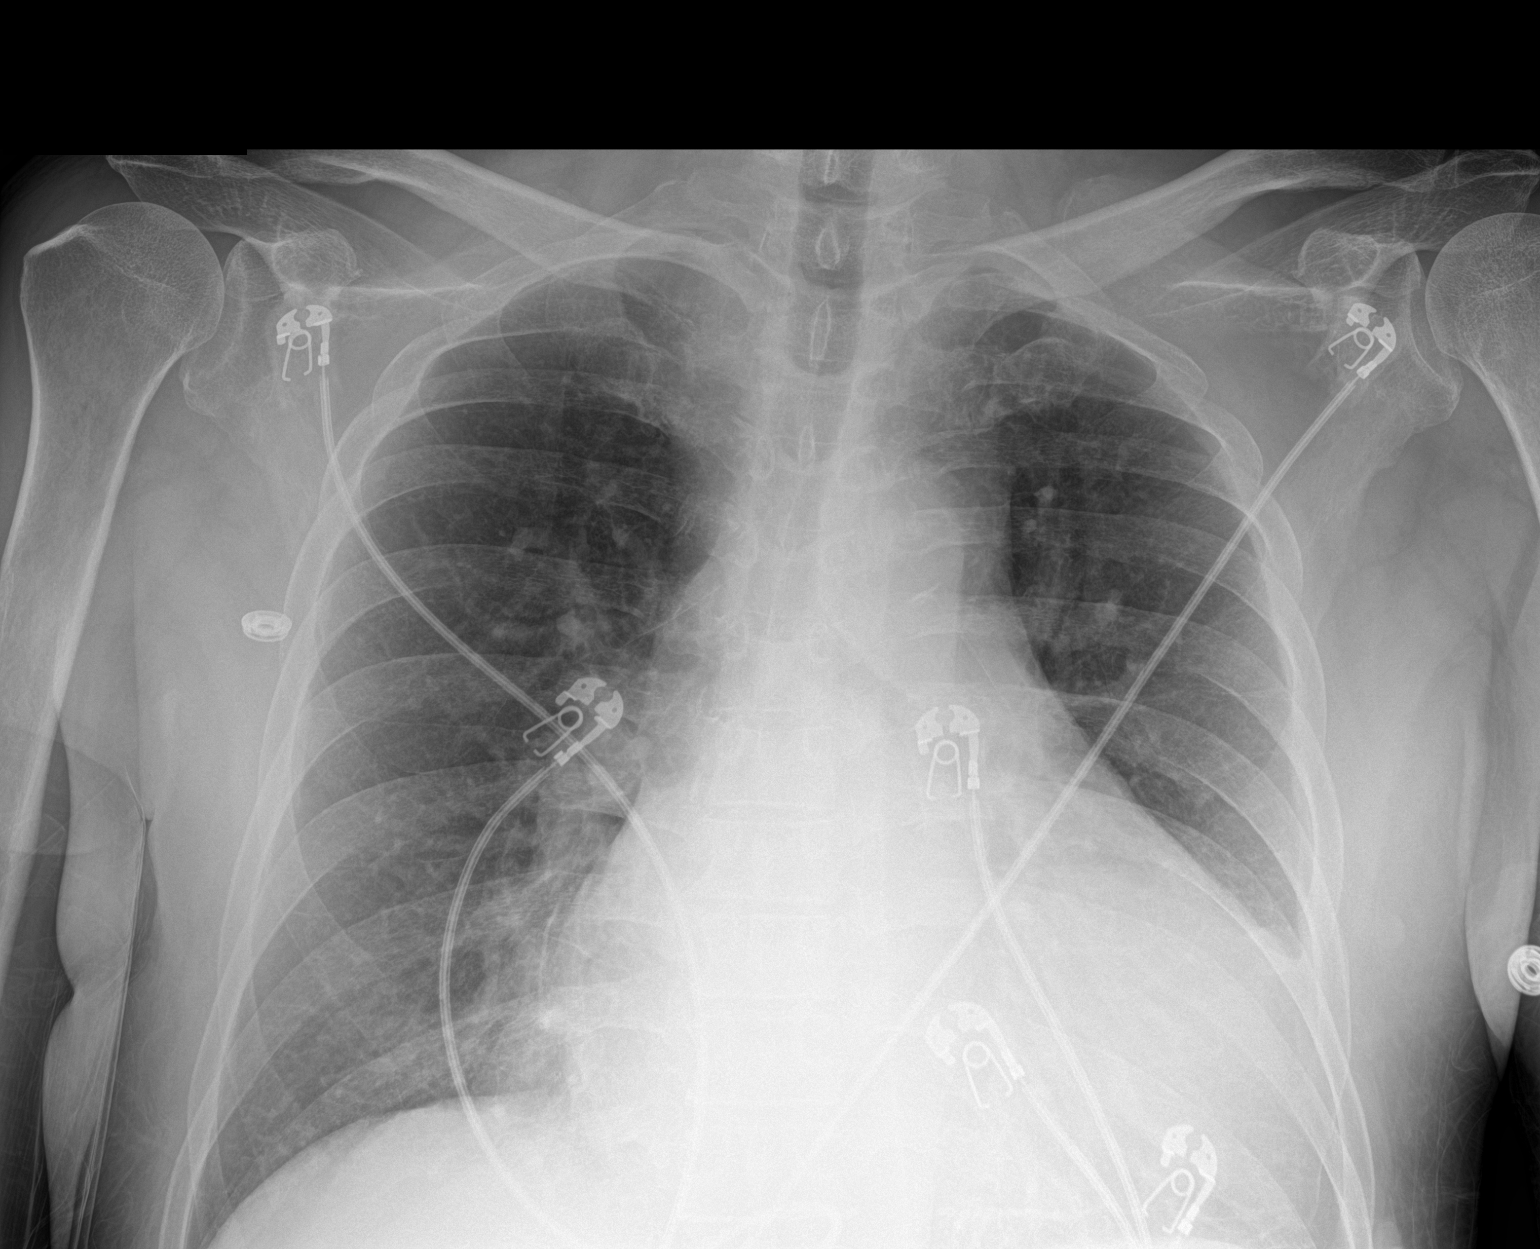

[1 of 1 positions shown; findings below may reference images not displayed]

FINDINGS: Lungs are clear. There is cardiomegaly with pulmonary vascularity
normal. No adenopathy. There is aortic atherosclerosis. No
pneumothorax. No bone lesions.
IMPRESSION: Stable cardiomegaly. Lungs clear. No pneumothorax. Aortic
Atherosclerosis ([id number]).

## 2021-03-26 IMAGING — CT CT CERVICAL SPINE W/O CM
3 of 4 series · 12 of 35 positions shown, 14 images · non-contrast
Comparison: CT brain and cervical spine 03/16/2020

CLINICAL DATA: Ground level fall

EXAM:
CT HEAD WITHOUT CONTRAST
CT CERVICAL SPINE WITHOUT CONTRAST
TECHNIQUE: Multidetector CT imaging of the head and cervical spine was
performed following the standard protocol without intravenous
contrast. Multiplanar CT image reconstructions of the cervical spine
were also generated.

[Series 7: sag bone · sagittal · 0.31mm/px · 5 of 79 slices shown, 6 images]
[im 27/79  bone]
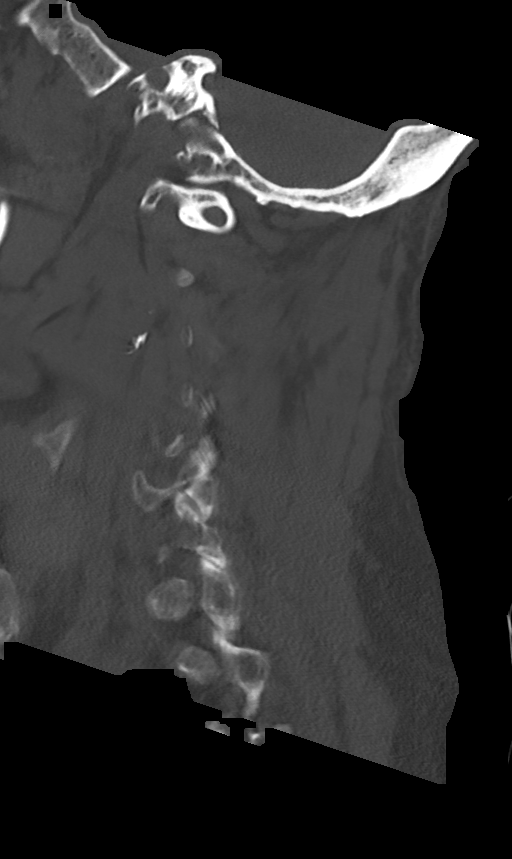
[im 33/79  bone]
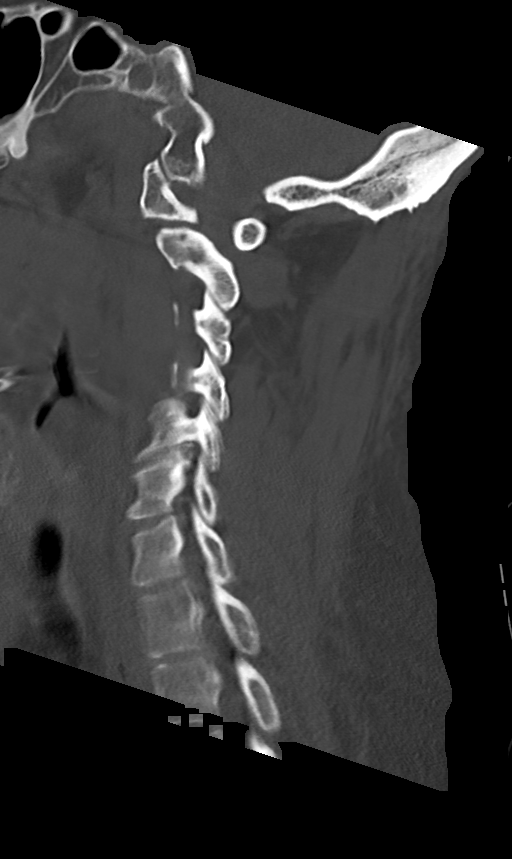
[im 40/79  soft-tissue]
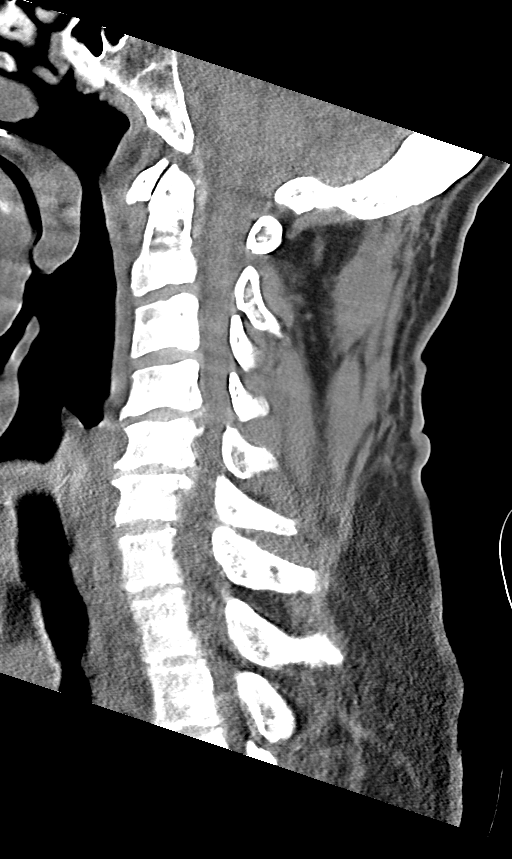
[im 40/79  bone]
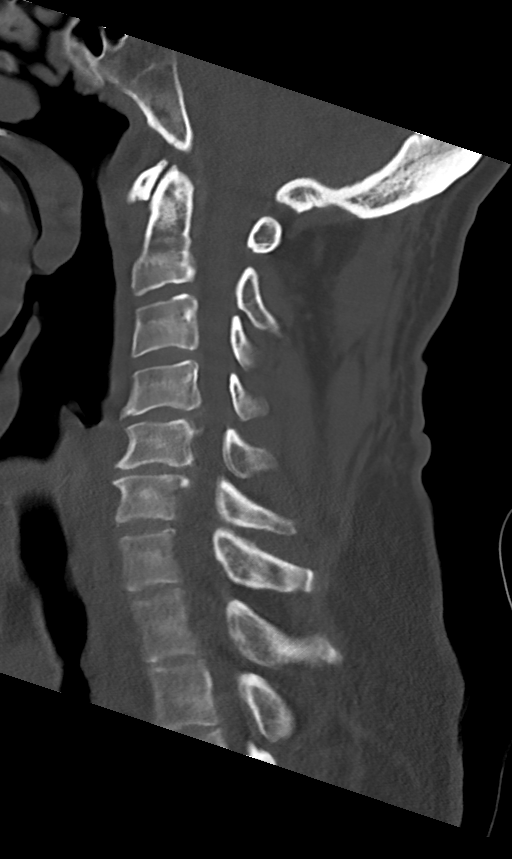
[im 46/79  bone]
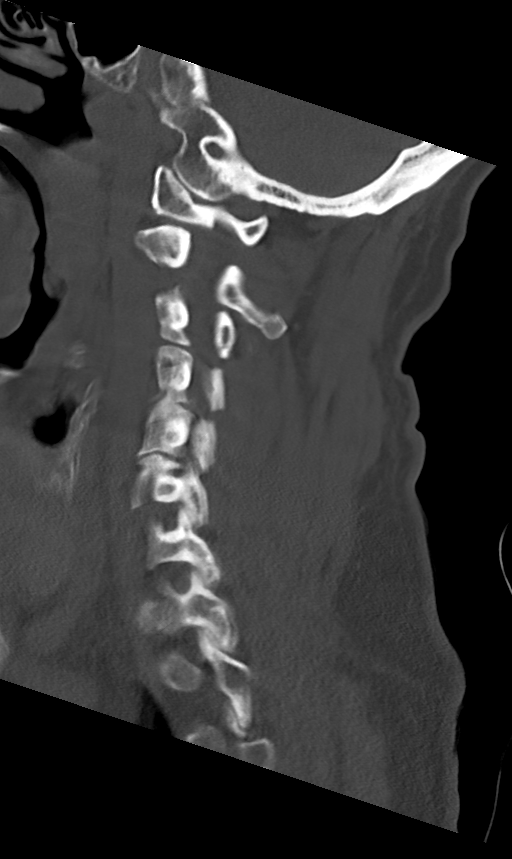
[im 53/79  bone]
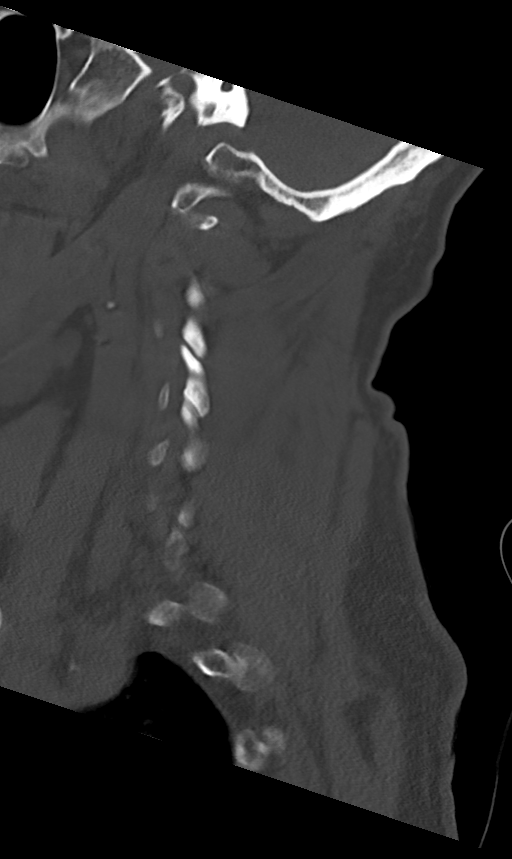

[Series 8: cor bone · coronal · 0.38mm/px · 3 of 77 slices shown]
[im 16/77  bone]
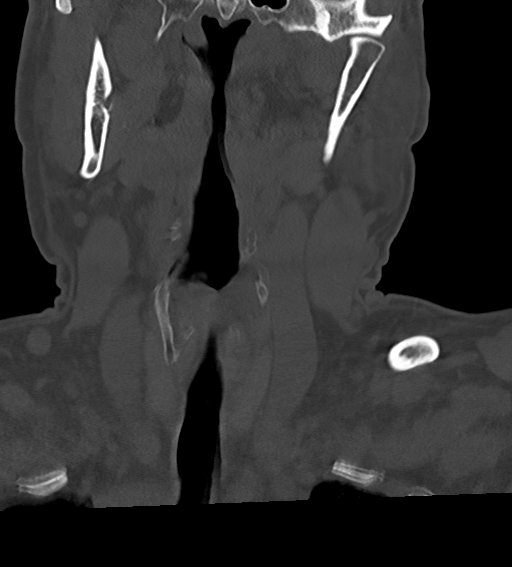
[im 31/77  bone]
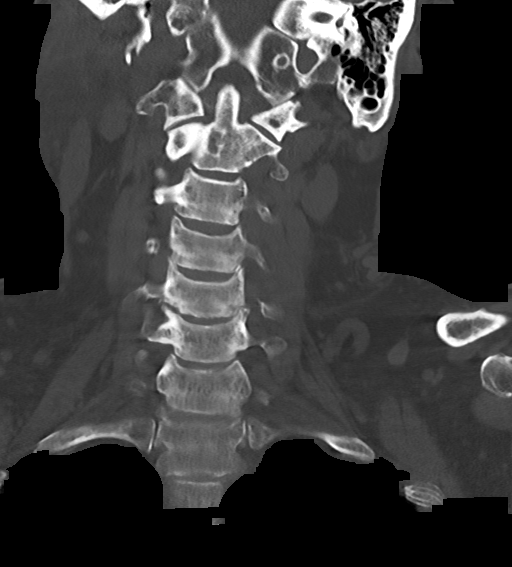
[im 46/77  bone]
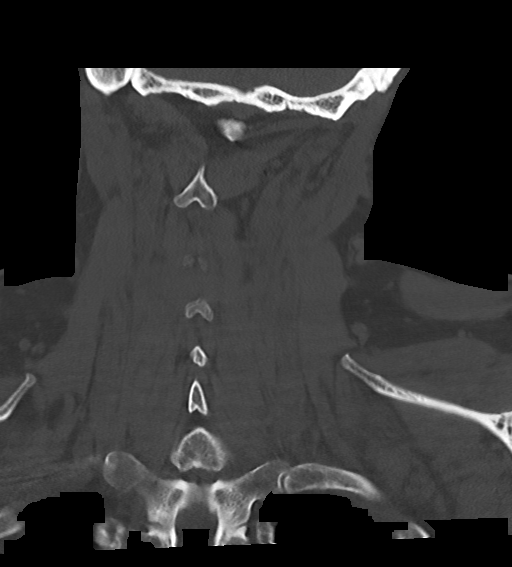

[Series 10: orthogonal axials · axial · 0.21mm/px · z∈[-257,-124]mm · 4 of 96 slices shown, 5 images]
[im 16/96  soft-tissue]
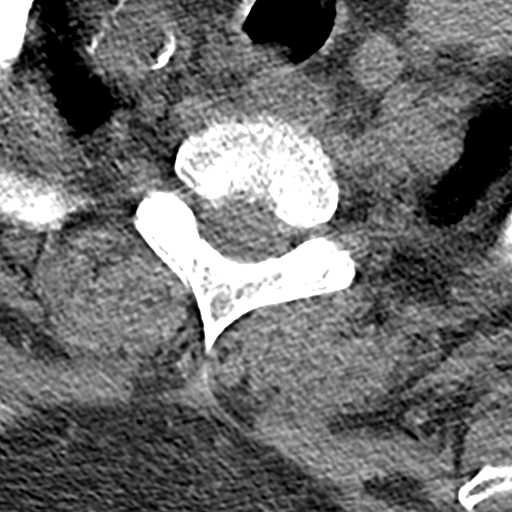
[im 16/96  bone]
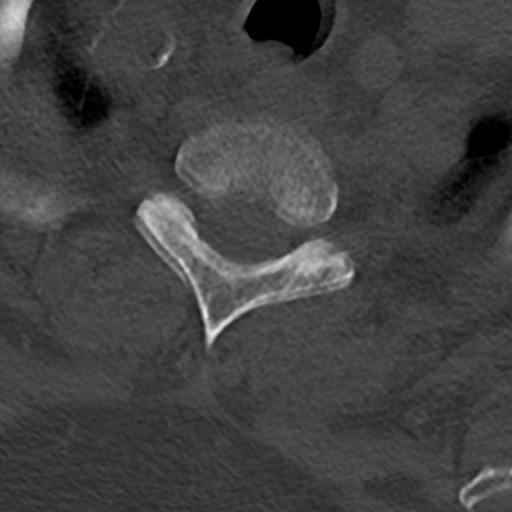
[im 32/96  bone]
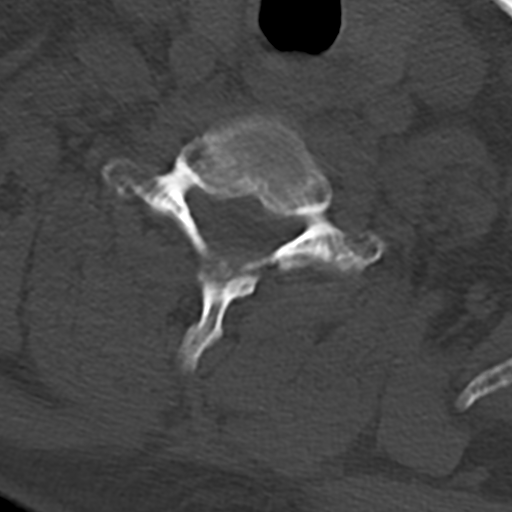
[im 64/96  bone]
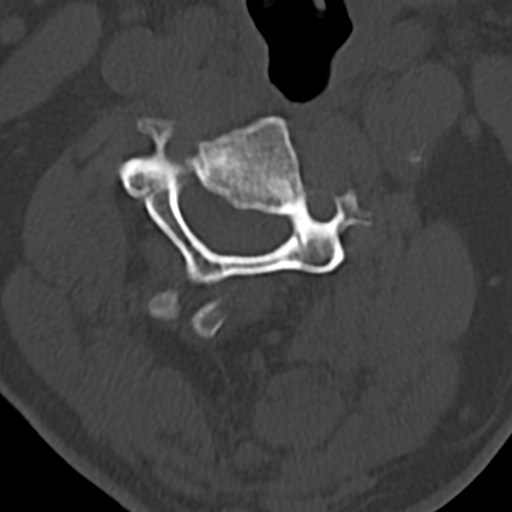
[im 80/96  bone]
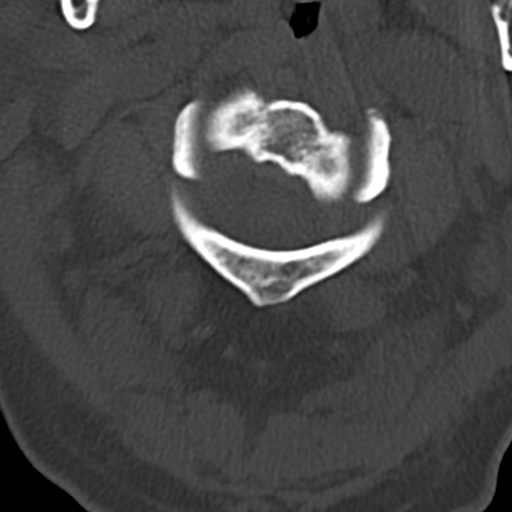

[12 of 35 positions shown; findings below may reference images not displayed]

FINDINGS: CT HEAD FINDINGS

Brain: No acute territorial infarction, hemorrhage, or intracranial
mass. Mild atrophy. Patchy hypodensity in the white matter
consistent with chronic small vessel ischemic change. The ventricles
are nonenlarged

Vascular: No hyperdense vessels.  Carotid vascular calcification.

Skull: Normal. Negative for fracture or focal lesion.

Sinuses/Orbits: Mild mucosal thickening in the maxillary and ethmoid
sinuses

Other: None

CT CERVICAL SPINE FINDINGS

Alignment: Mild motion degradation. Sagittal alignment is stable
with mild reversal of cervical lordosis. Facet alignment is
maintained. There is rotated appearance of C1 on C2 which is
probably positional.

Skull base and vertebrae: No acute fracture. No primary bone lesion
or focal pathologic process.

Soft tissues and spinal canal: No prevertebral fluid or swelling. No
visible canal hematoma.

Disc levels: Mild to moderate degenerative changes at C4-C5 and
C5-C6. Posterior disc osteophyte complex at C4-C5 and C5-C6 results
in at least mild canal stenosis at C4-C5 and moderate canal stenosis
at C5-C6. Facet degenerative changes at multiple levels with
bilateral foraminal stenosis, mild at C4-C5 and moderate at C5-C6.

Upper chest: Negative.

Other: Carotid vascular calcification.
IMPRESSION: 1. No CT evidence for acute intracranial abnormality. Atrophy and
chronic small vessel ischemic changes of the white matter.
2. Degenerative changes of the cervical spine. No definitive
fracture is seen. Rotated appearance of C1 on C2 is similar as
compared with 03/16/2020 and suspected to be secondary to
positioning.

## 2021-03-26 IMAGING — DX DG PELVIS 1-2V
1 series · 1 of 1 positions shown · non-contrast
Comparison: Portable exam 3700 hours compared to 08/16/2018

CLINICAL DATA: Fell, found in yard, ethanol present

EXAM:
PELVIS - 1-2 VIEW

[pelvis ap]
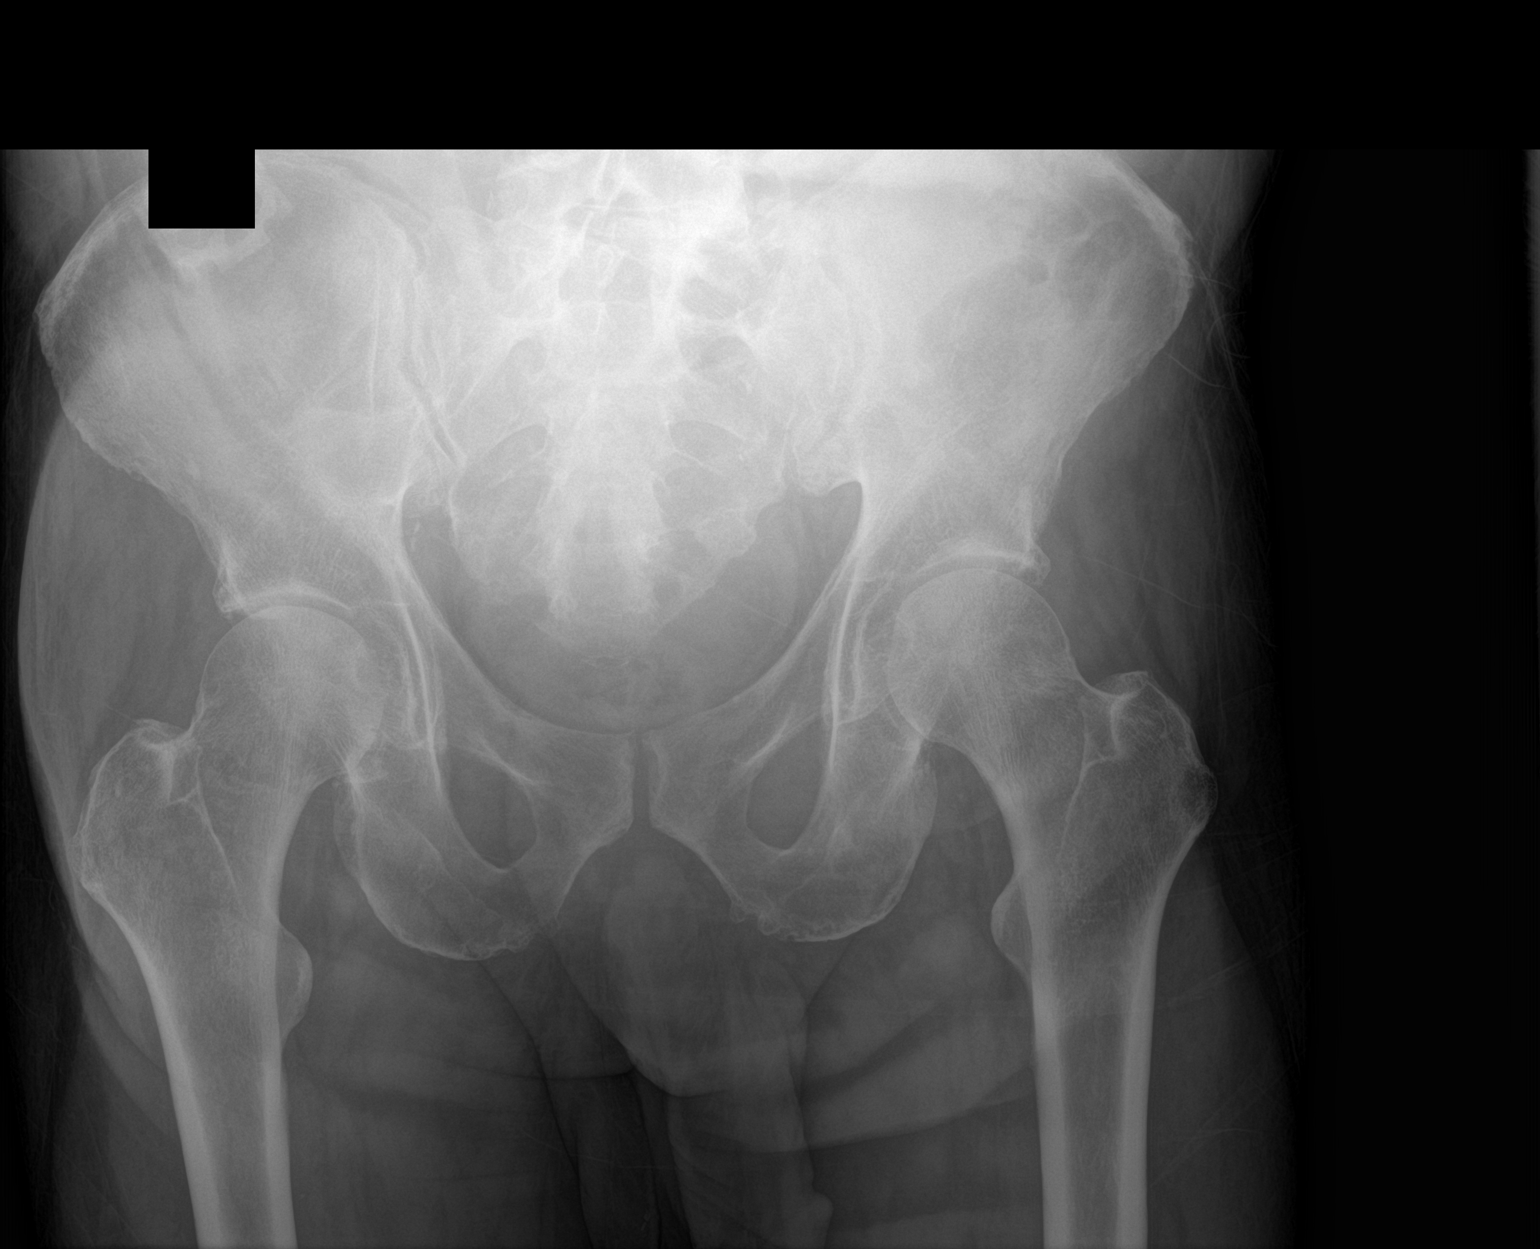

[1 of 1 positions shown; findings below may reference images not displayed]

FINDINGS: Osseous demineralization.

Hip and SI joint spaces preserved.

Minimal regularity at the lateral margins of the femoral head/neck
junctions bilaterally likely degenerative.

No acute fracture, dislocation or bone destruction.
IMPRESSION: No acute abnormalities.

## 2021-03-26 IMAGING — CT CT HEAD W/O CM
4 series · 16 of 47 positions shown, 18 images · non-contrast
Comparison: CT brain and cervical spine 03/16/2020

CLINICAL DATA: Ground level fall

EXAM:
CT HEAD WITHOUT CONTRAST
CT CERVICAL SPINE WITHOUT CONTRAST
TECHNIQUE: Multidetector CT imaging of the head and cervical spine was
performed following the standard protocol without intravenous
contrast. Multiplanar CT image reconstructions of the cervical spine
were also generated.

[Series 3: head wo · axial · 0.46mm/px · z∈[-110,+24]mm · 7 of 37 slices shown, 9 images]
[im 5/37  brain]
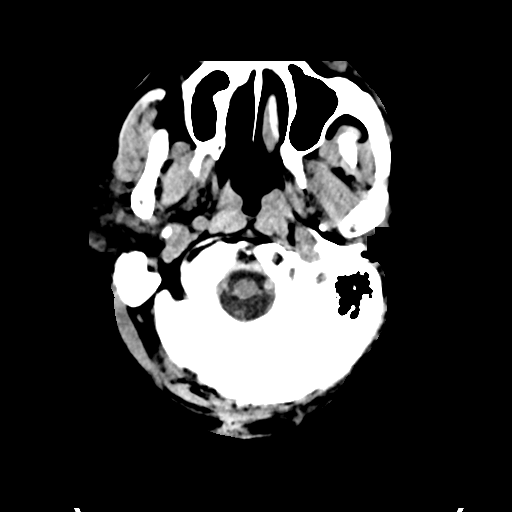
[im 5/37  bone]
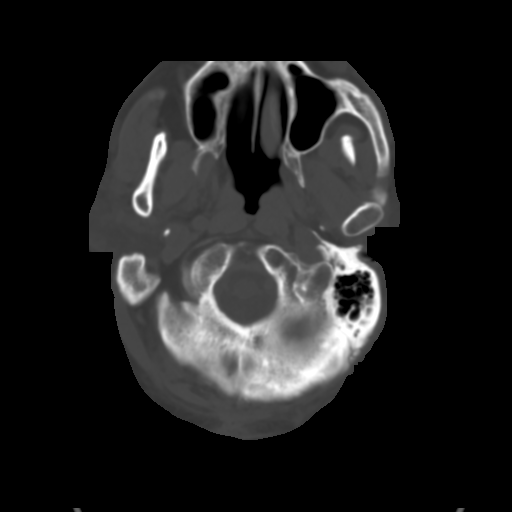
[im 10/37  brain]
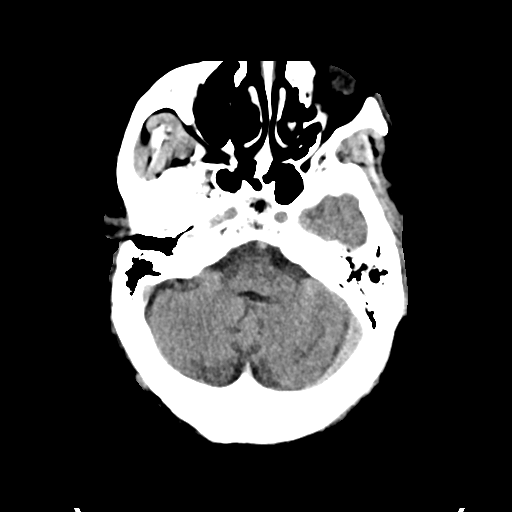
[im 14/37  brain]
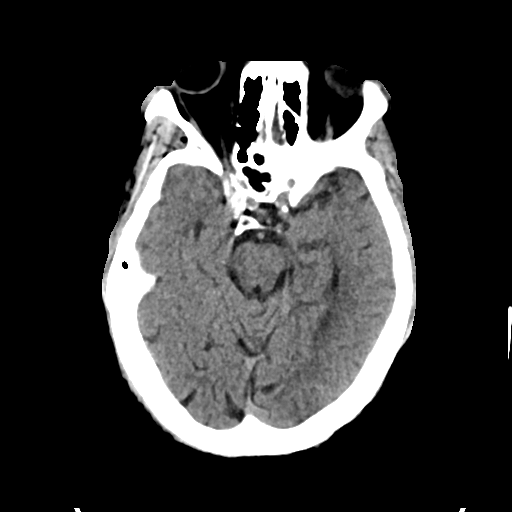
[im 19/37  brain]
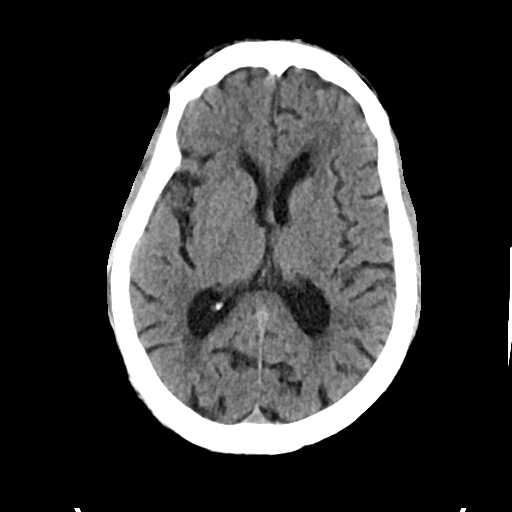
[im 23/37  brain]
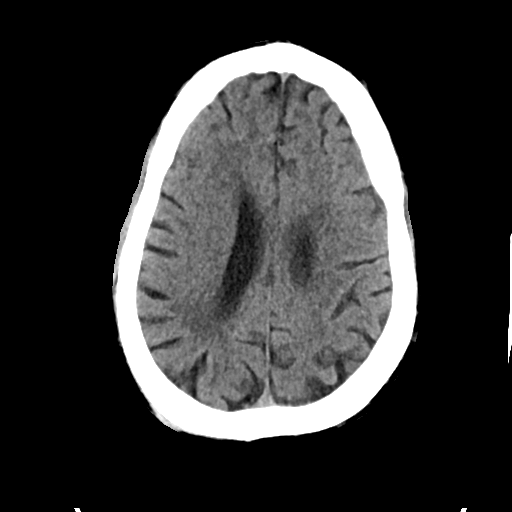
[im 23/37  bone]
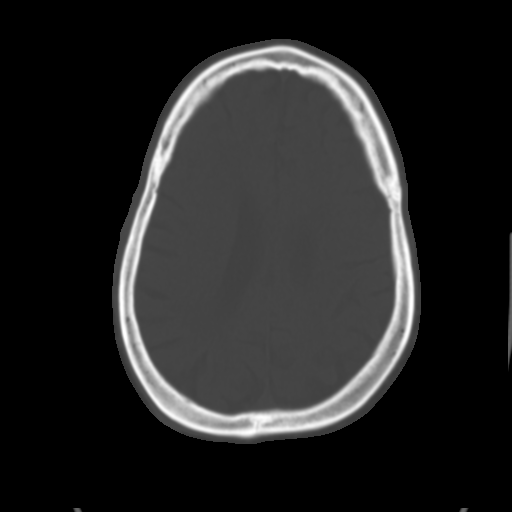
[im 28/37  brain]
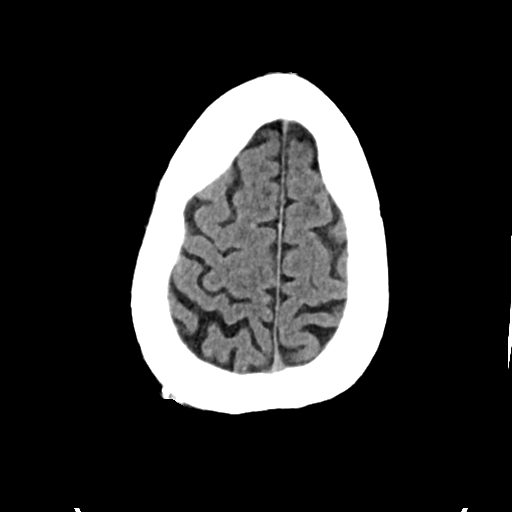
[im 32/37  brain]
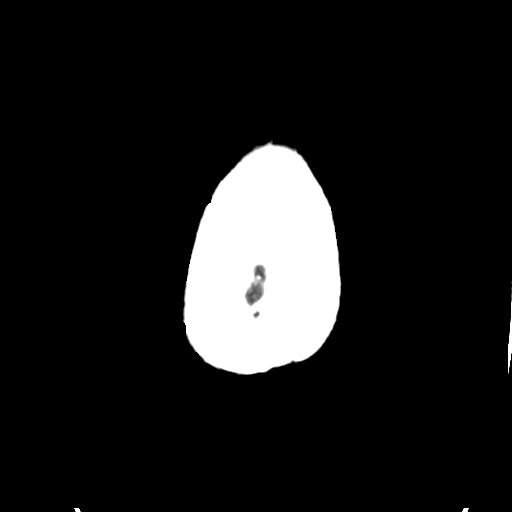

[Series 4: head bone · axial · 0.46mm/px · z∈[-112,-76]mm · 3 of 92 slices shown]
[im 10/92  bone]
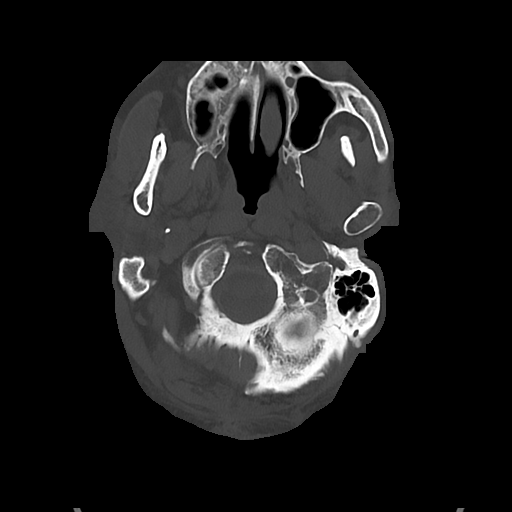
[im 19/92  bone]
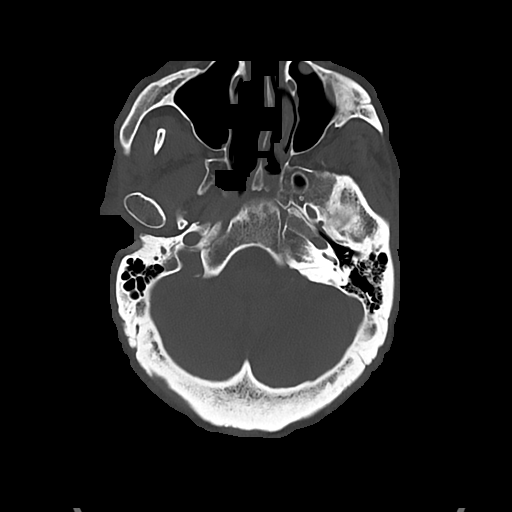
[im 28/92  bone]
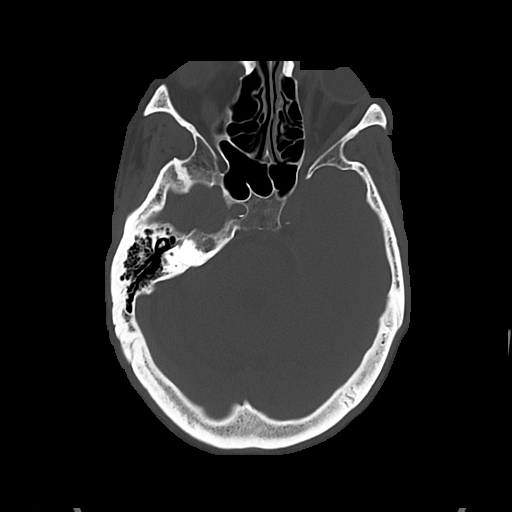

[Series 5: cor soft · coronal · 0.32mm/px · 3 of 75 slices shown]
[im 25/75  brain]
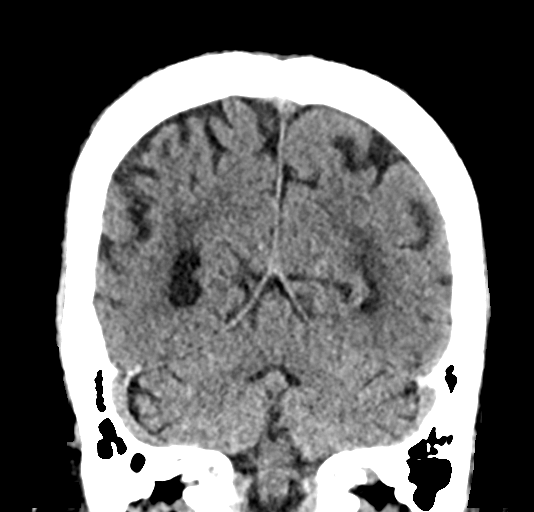
[im 33/75  brain]
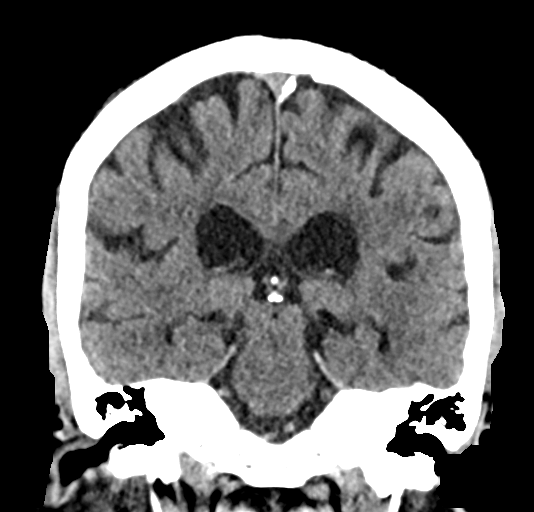
[im 42/75  brain]
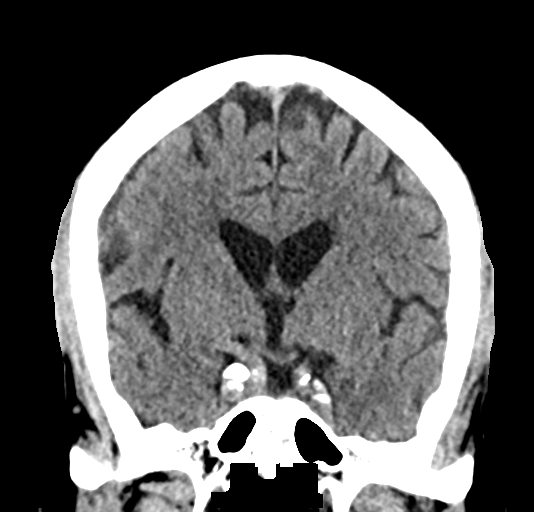

[Series 6: sag soft · sagittal · 0.37mm/px · 3 of 55 slices shown]
[im 19/55  brain]
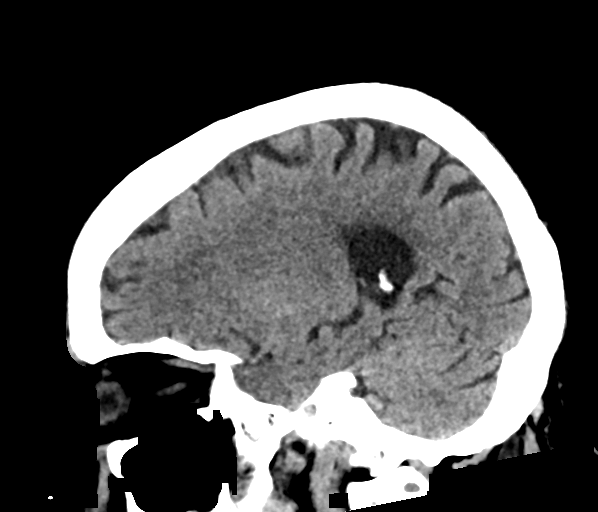
[im 28/55  brain]
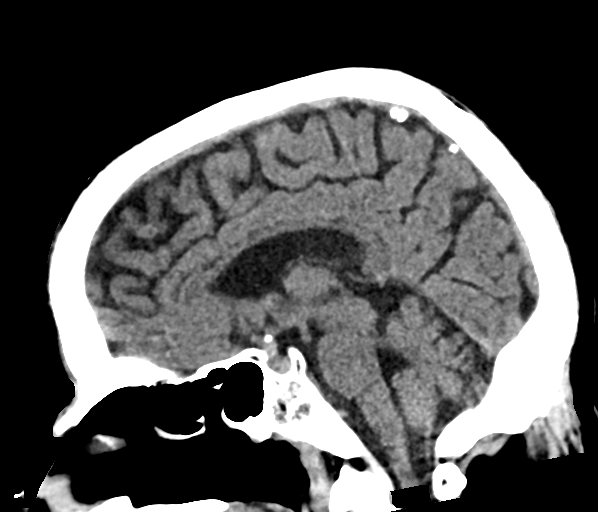
[im 37/55  brain]
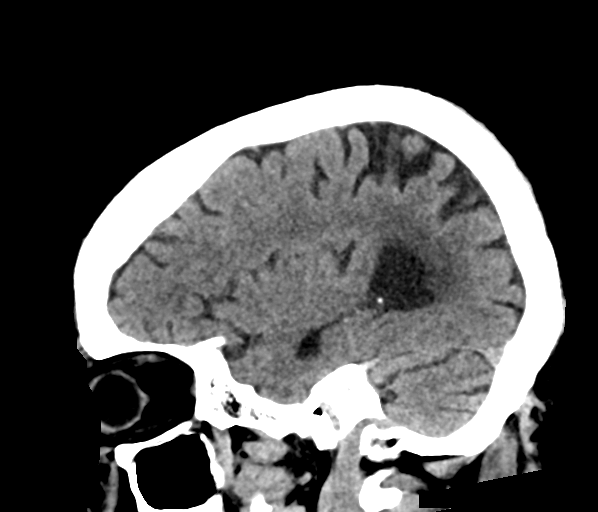

[16 of 47 positions shown; findings below may reference images not displayed]

FINDINGS: CT HEAD FINDINGS

Brain: No acute territorial infarction, hemorrhage, or intracranial
mass. Mild atrophy. Patchy hypodensity in the white matter
consistent with chronic small vessel ischemic change. The ventricles
are nonenlarged

Vascular: No hyperdense vessels.  Carotid vascular calcification.

Skull: Normal. Negative for fracture or focal lesion.

Sinuses/Orbits: Mild mucosal thickening in the maxillary and ethmoid
sinuses

Other: None

CT CERVICAL SPINE FINDINGS

Alignment: Mild motion degradation. Sagittal alignment is stable
with mild reversal of cervical lordosis. Facet alignment is
maintained. There is rotated appearance of C1 on C2 which is
probably positional.

Skull base and vertebrae: No acute fracture. No primary bone lesion
or focal pathologic process.

Soft tissues and spinal canal: No prevertebral fluid or swelling. No
visible canal hematoma.

Disc levels: Mild to moderate degenerative changes at C4-C5 and
C5-C6. Posterior disc osteophyte complex at C4-C5 and C5-C6 results
in at least mild canal stenosis at C4-C5 and moderate canal stenosis
at C5-C6. Facet degenerative changes at multiple levels with
bilateral foraminal stenosis, mild at C4-C5 and moderate at C5-C6.

Upper chest: Negative.

Other: Carotid vascular calcification.
IMPRESSION: 1. No CT evidence for acute intracranial abnormality. Atrophy and
chronic small vessel ischemic changes of the white matter.
2. Degenerative changes of the cervical spine. No definitive
fracture is seen. Rotated appearance of C1 on C2 is similar as
compared with 03/16/2020 and suspected to be secondary to
positioning.

## 2021-04-02 ENCOUNTER — Telehealth: Payer: Self-pay | Admitting: Family Medicine

## 2021-04-02 ENCOUNTER — Other Ambulatory Visit: Payer: Self-pay | Admitting: Family Medicine

## 2021-04-02 DIAGNOSIS — M5412 Radiculopathy, cervical region: Secondary | ICD-10-CM

## 2021-04-02 DIAGNOSIS — Z794 Long term (current) use of insulin: Secondary | ICD-10-CM

## 2021-04-02 DIAGNOSIS — E1159 Type 2 diabetes mellitus with other circulatory complications: Secondary | ICD-10-CM

## 2021-04-02 DIAGNOSIS — E1140 Type 2 diabetes mellitus with diabetic neuropathy, unspecified: Secondary | ICD-10-CM

## 2021-04-02 IMAGING — CT CT CERVICAL SPINE W/O CM
3 of 4 series · 12 of 35 positions shown, 14 images · non-contrast
Comparison: 03/16/2020

CLINICAL DATA: Syncope, fall, posterior headache, neck pain.

EXAM:
CT HEAD WITHOUT CONTRAST
CT CERVICAL SPINE WITHOUT CONTRAST
TECHNIQUE: Multidetector CT imaging of the head and cervical spine was
performed following the standard protocol without intravenous
contrast. Multiplanar CT image reconstructions of the cervical spine
were also generated.

[Series 5: c_spine 2.0 st · axial · 0.34mm/px · z∈[+1224,+1354]mm · 4 of 93 slices shown, 5 images]
[im 14/93  soft-tissue]
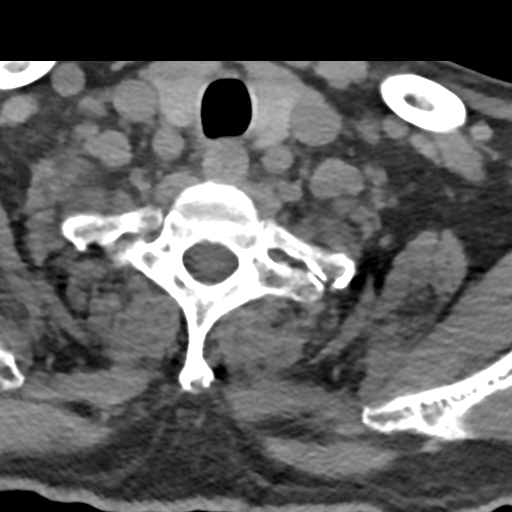
[im 14/93  bone]
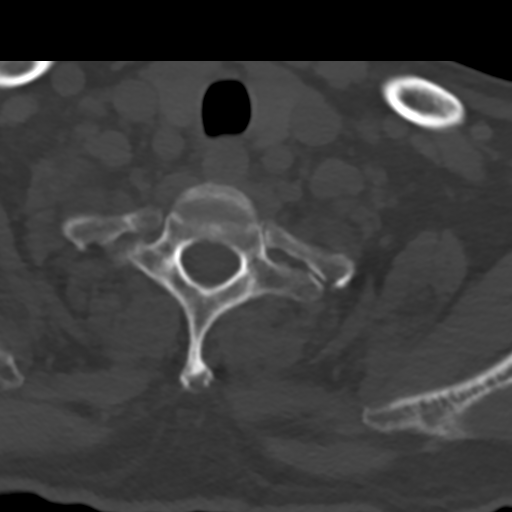
[im 40/93  bone]
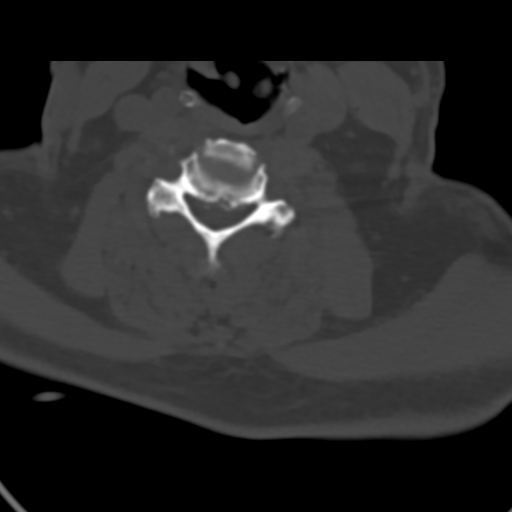
[im 53/93  bone]
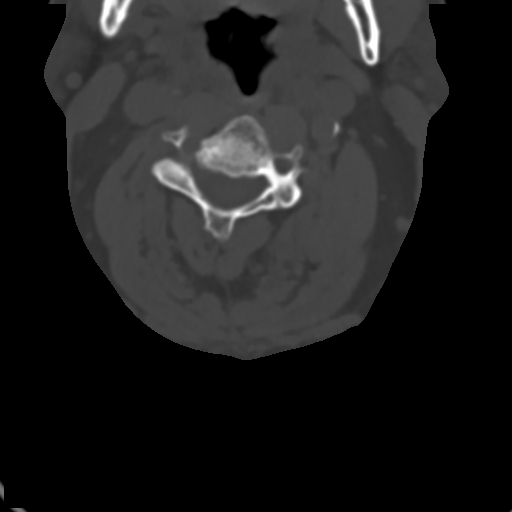
[im 79/93  bone]
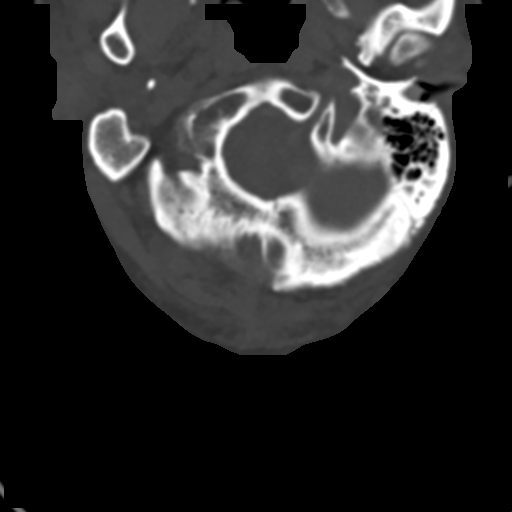

[Series 6: coronal bone · coronal · 0.26mm/px · 3 of 68 slices shown]
[im 14/68  bone]
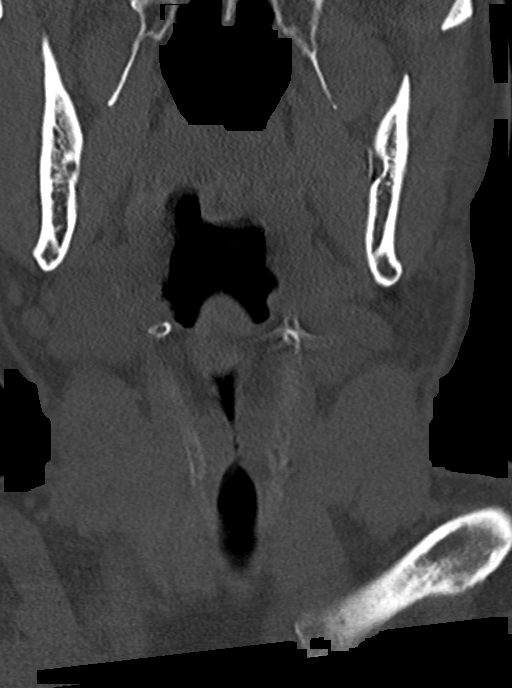
[im 27/68  bone]
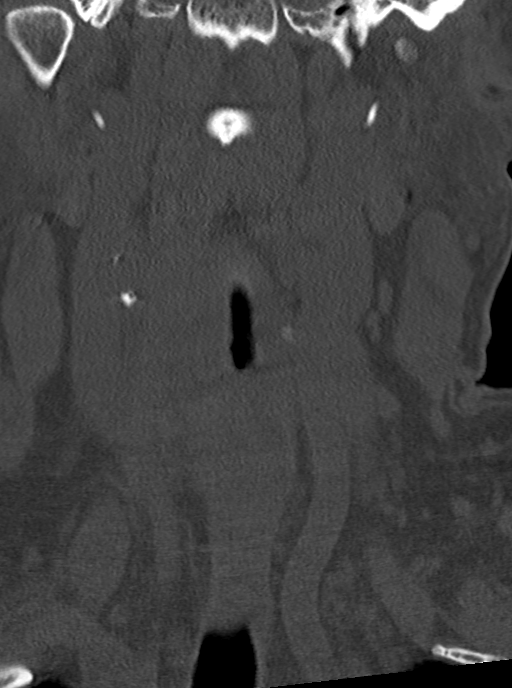
[im 41/68  bone]
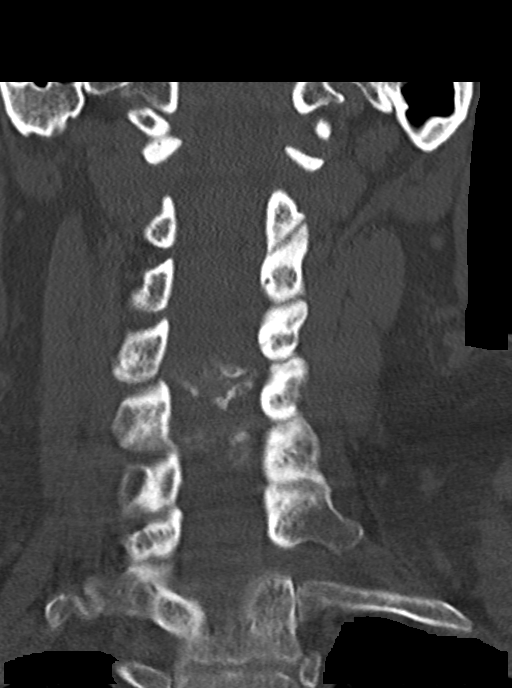

[Series 7: sagittal bone · sagittal · 0.23mm/px · 5 of 65 slices shown, 6 images]
[im 22/65  bone]
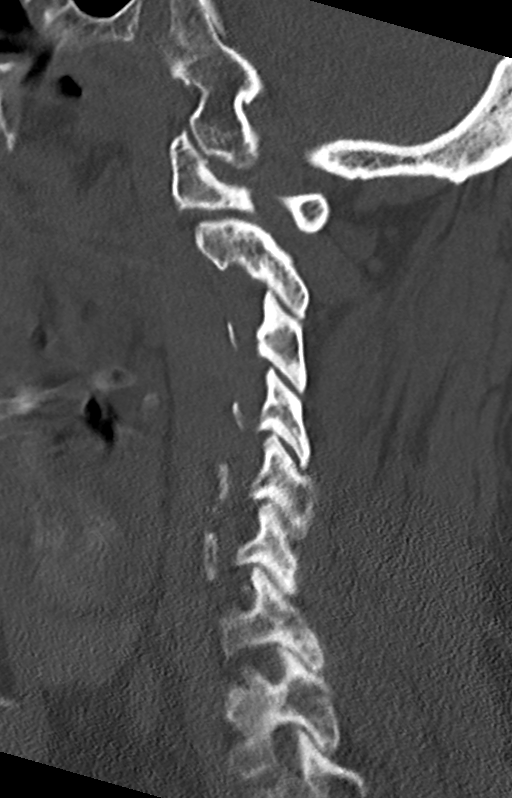
[im 27/65  bone]
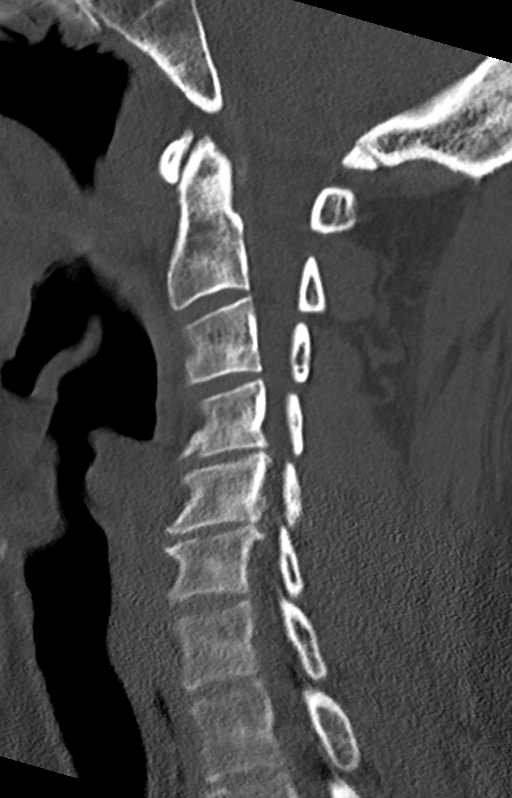
[im 33/65  soft-tissue]
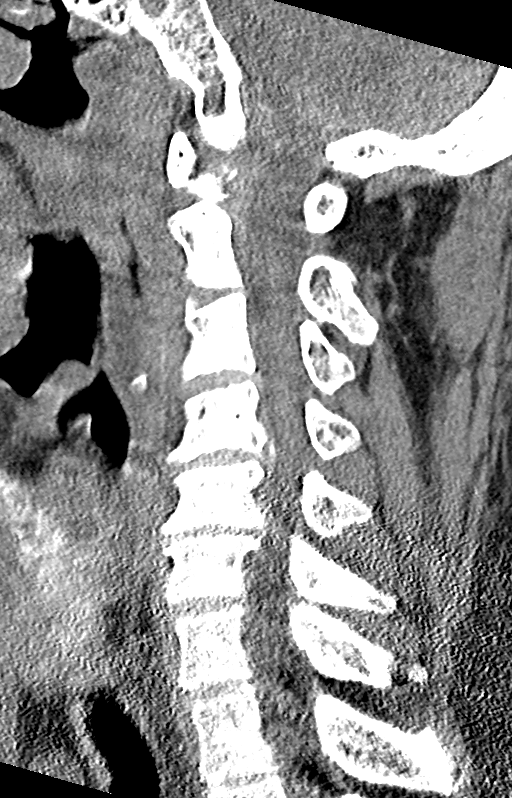
[im 33/65  bone]
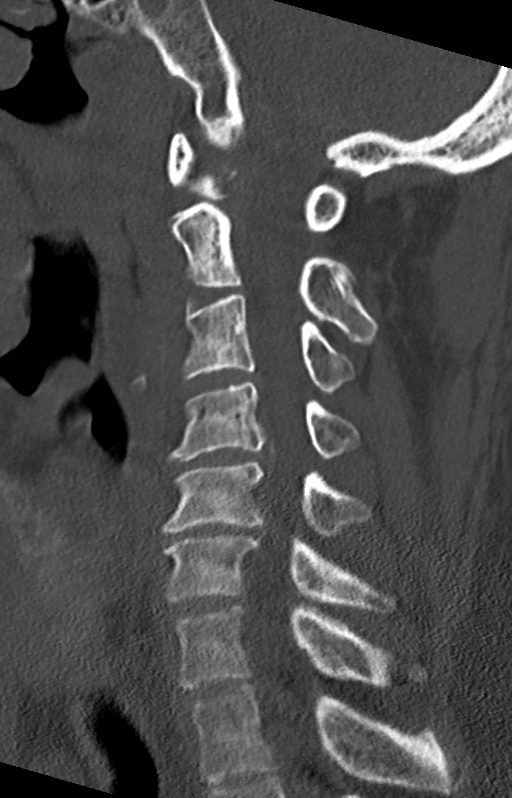
[im 38/65  bone]
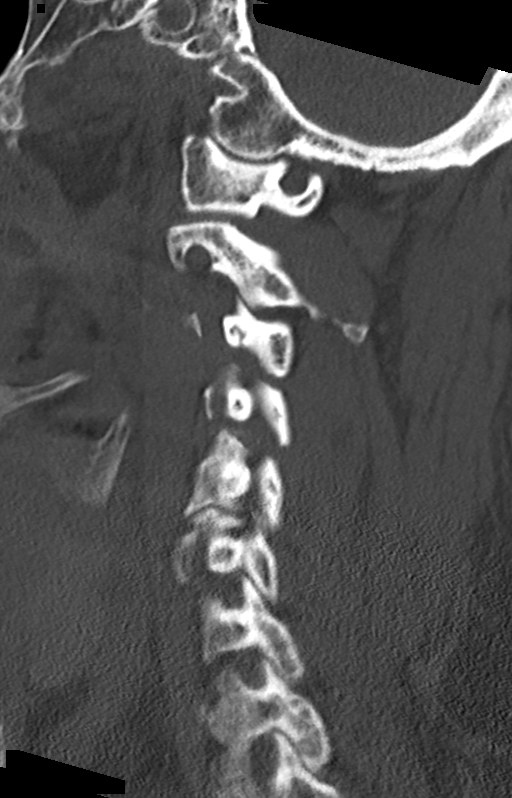
[im 43/65  bone]
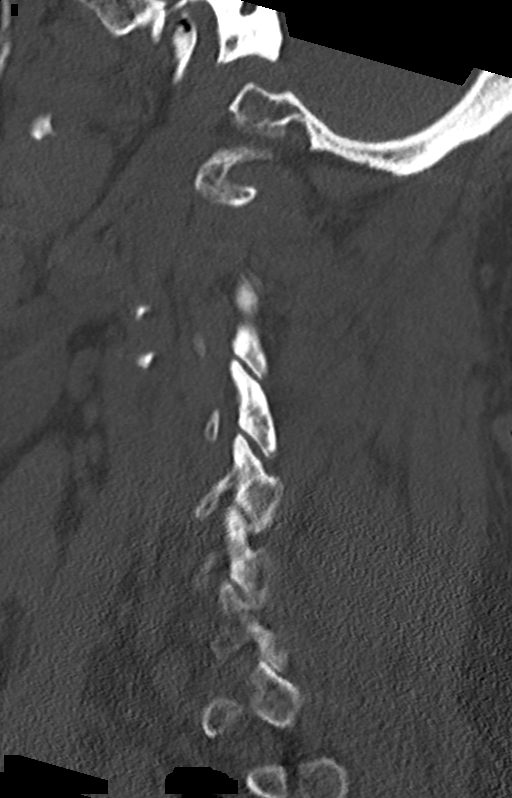

[12 of 35 positions shown; findings below may reference images not displayed]

FINDINGS: CT HEAD FINDINGS

Brain: No evidence of acute infarction, hemorrhage, hydrocephalus,
extra-axial collection or mass lesion/mass effect.

Subcortical white matter and periventricular small vessel ischemic
changes.

Vascular: Intracranial atherosclerosis.

Skull: Normal. Negative for fracture or focal lesion.

Sinuses/Orbits: The visualized paranasal sinuses are essentially
clear. The mastoid air cells are unopacified.

Other: None.

CT CERVICAL SPINE FINDINGS

Alignment: Reversal of the normal mid cervical lordosis.

Skull base and vertebrae: No acute fracture. No primary bone lesion
or focal pathologic process.

Soft tissues and spinal canal: No prevertebral fluid or swelling. No
visible canal hematoma.

Disc levels: Mild degenerative changes of the mid cervical spine.
Spinal canal is patent.

Upper chest: Visualized lung apices are clear.

Other: Visualized thyroid is unremarkable.
IMPRESSION: No evidence of acute intracranial abnormality. Small vessel ischemic
changes.

No evidence of traumatic injury to the cervical spine. Mild
degenerative changes of the mid cervical spine.

## 2021-04-02 IMAGING — CT CT HEAD W/O CM
1 series · 12 of 33 positions shown, 15 images · non-contrast
Comparison: 03/16/2020

CLINICAL DATA: Syncope, fall, posterior headache, neck pain.

EXAM:
CT HEAD WITHOUT CONTRAST
CT CERVICAL SPINE WITHOUT CONTRAST
TECHNIQUE: Multidetector CT imaging of the head and cervical spine was
performed following the standard protocol without intravenous
contrast. Multiplanar CT image reconstructions of the cervical spine
were also generated.

[Series 6: head 3.0 mpr sag · sagittal · 0.33mm/px · 12 of 67 slices shown, 15 images]
[im 5/67  brain]
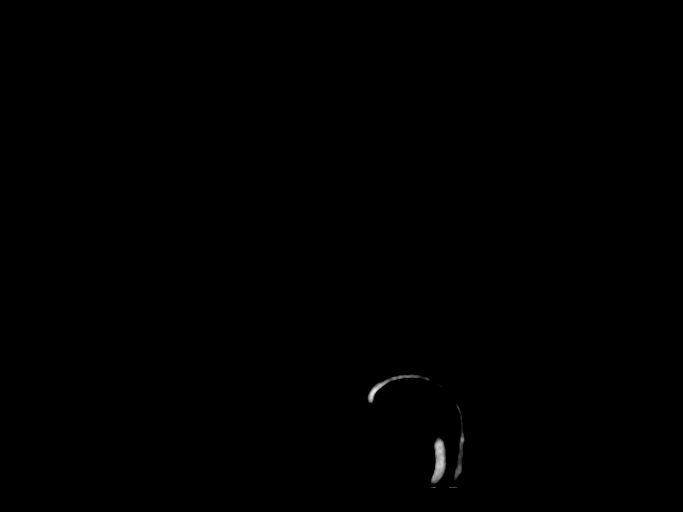
[im 5/67  bone]
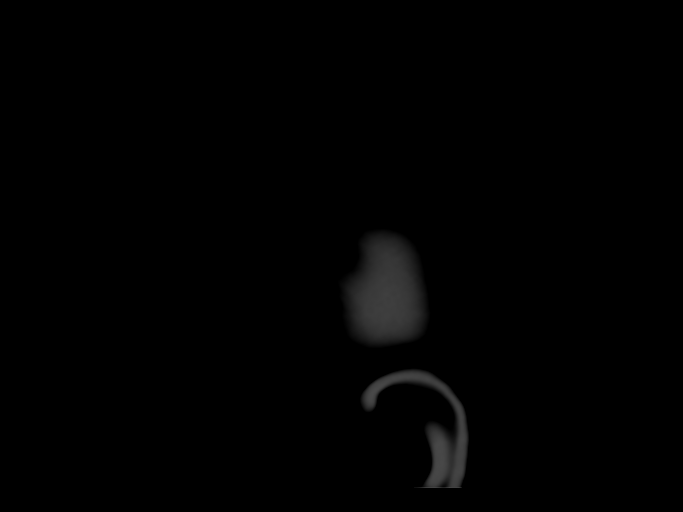
[im 10/67  brain]
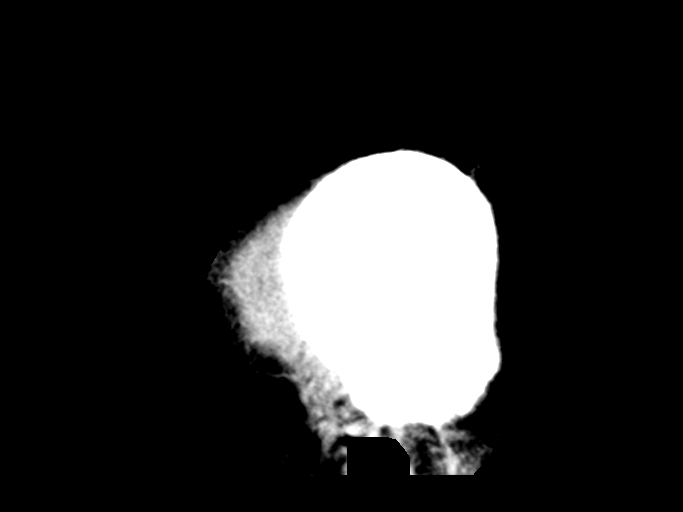
[im 16/67  brain]
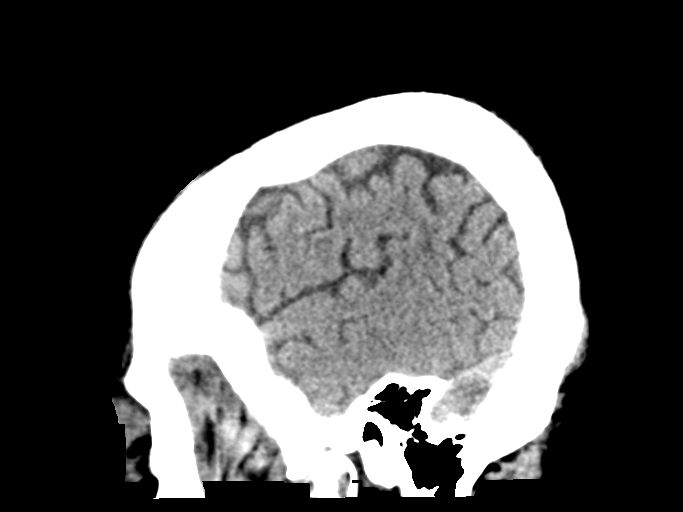
[im 23/67  brain]
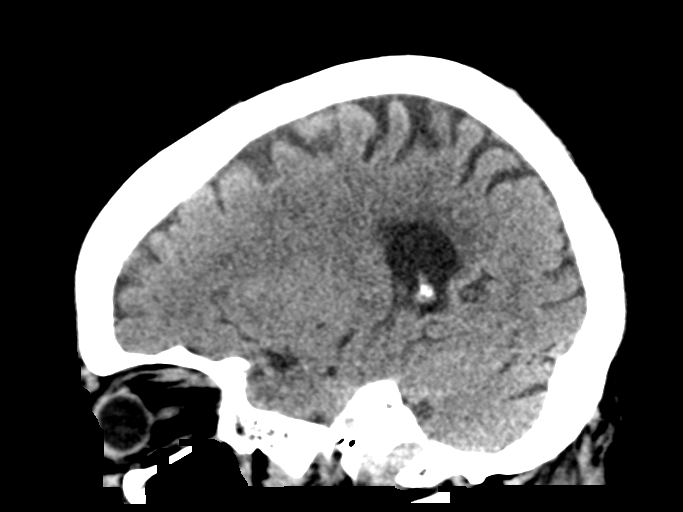
[im 28/67  brain]
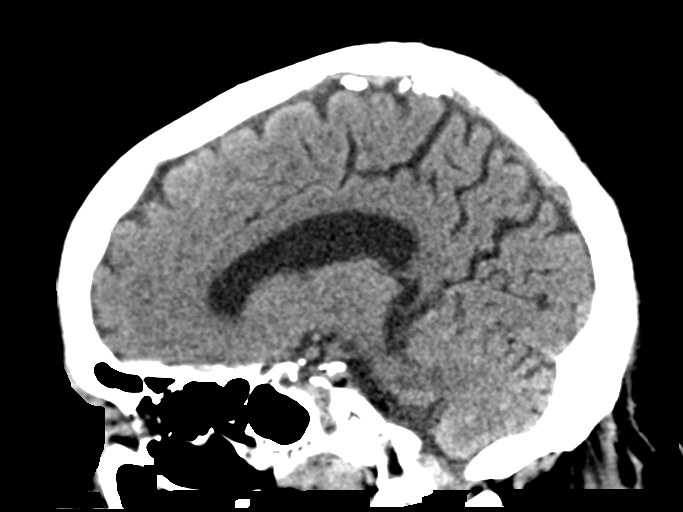
[im 28/67  bone]
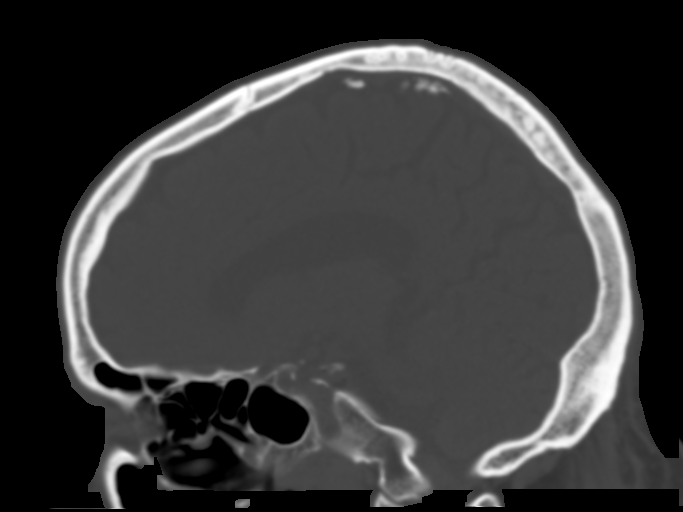
[im 34/67  brain]
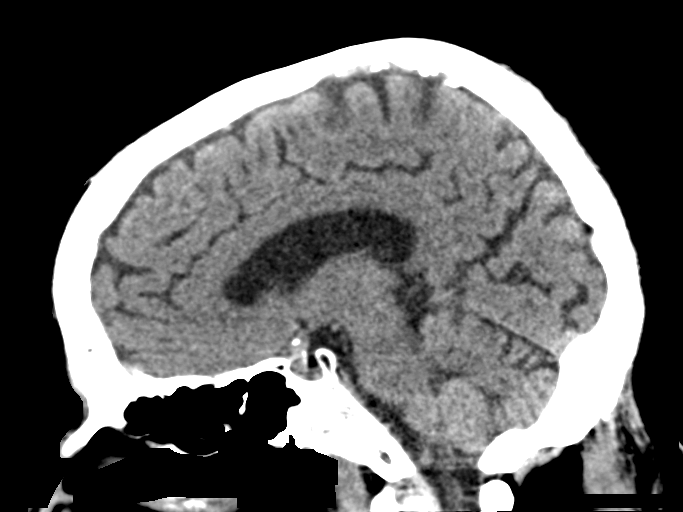
[im 37/67  brain]
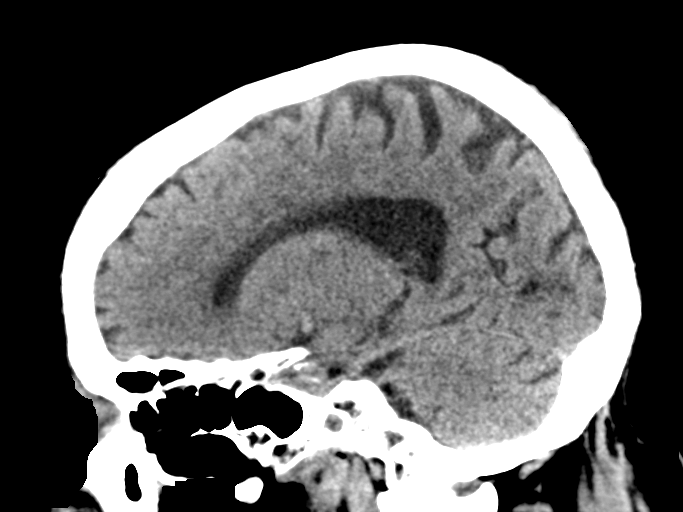
[im 44/67  brain]
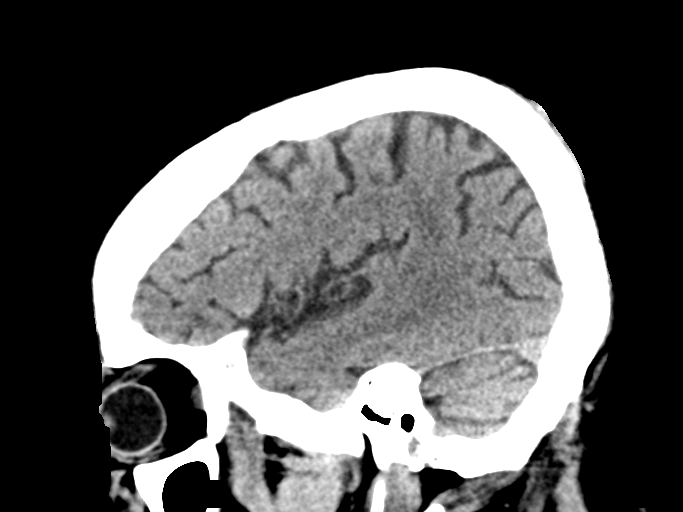
[im 46/67  brain]
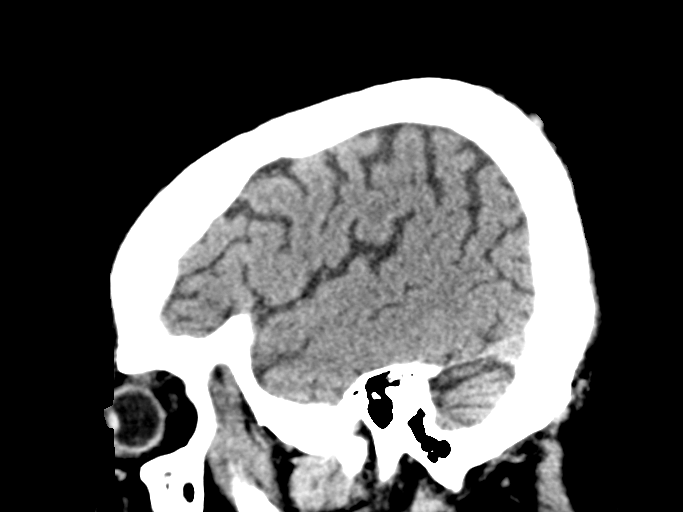
[im 46/67  bone]
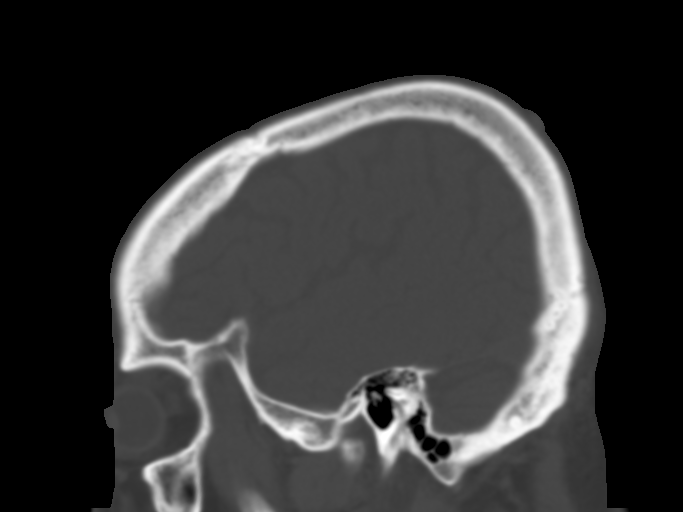
[im 53/67  brain]
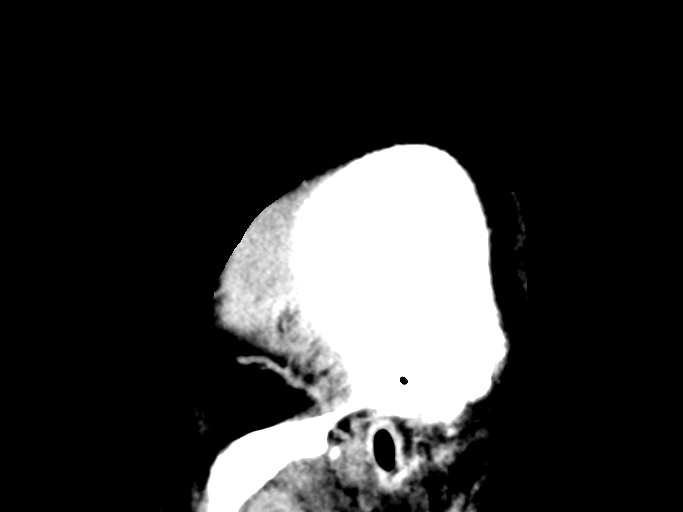
[im 57/67  brain]
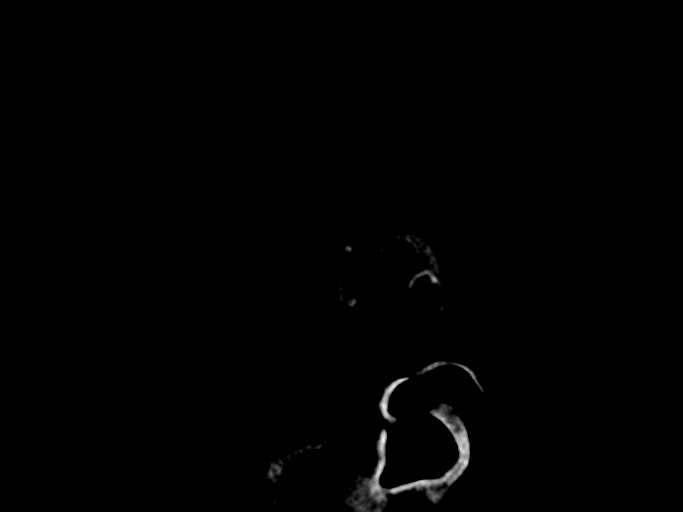
[im 62/67  brain]
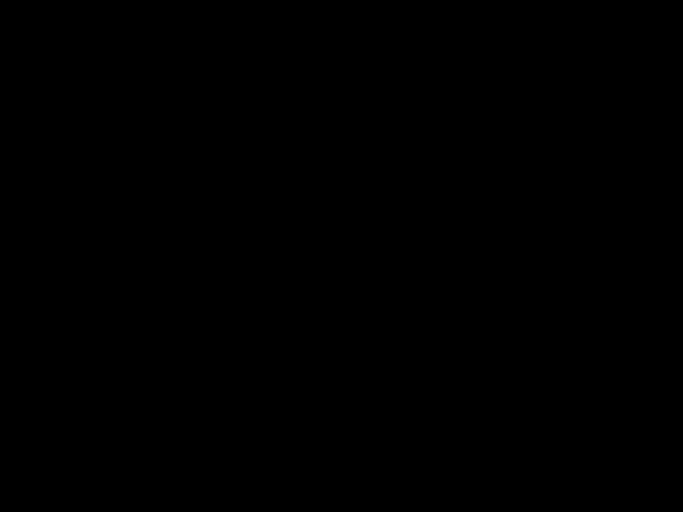

[12 of 33 positions shown; findings below may reference images not displayed]

FINDINGS: CT HEAD FINDINGS

Brain: No evidence of acute infarction, hemorrhage, hydrocephalus,
extra-axial collection or mass lesion/mass effect.

Subcortical white matter and periventricular small vessel ischemic
changes.

Vascular: Intracranial atherosclerosis.

Skull: Normal. Negative for fracture or focal lesion.

Sinuses/Orbits: The visualized paranasal sinuses are essentially
clear. The mastoid air cells are unopacified.

Other: None.

CT CERVICAL SPINE FINDINGS

Alignment: Reversal of the normal mid cervical lordosis.

Skull base and vertebrae: No acute fracture. No primary bone lesion
or focal pathologic process.

Soft tissues and spinal canal: No prevertebral fluid or swelling. No
visible canal hematoma.

Disc levels: Mild degenerative changes of the mid cervical spine.
Spinal canal is patent.

Upper chest: Visualized lung apices are clear.

Other: Visualized thyroid is unremarkable.
IMPRESSION: No evidence of acute intracranial abnormality. Small vessel ischemic
changes.

No evidence of traumatic injury to the cervical spine. Mild
degenerative changes of the mid cervical spine.

## 2021-04-02 MED ORDER — ACCU-CHEK GUIDE VI STRP: ORAL_STRIP | 12 refills | Status: AC

## 2021-04-02 NOTE — Telephone Encounter (Signed)
   Notes to clinic:  Patient was a no show to appointment on 02/12/2021 Patient was due for a follow up before next fill Review for another refill    Requested Prescriptions  Pending Prescriptions Disp Refills   DULoxetine (CYMBALTA) 60 MG capsule [Pharmacy Med Name: DULOXETINE HCL 60 MG ORAL CAPSULE DELAYED RELEASE PARTICLES] 30 capsule 0    Sig: TAKE 1 CAPSULE (60 MG TOTAL) BY MOUTH DAILY. (MORNING)      Psychiatry: Antidepressants - SNRI Passed - 04/02/2021 11:56 AM      Passed - Completed PHQ-2 or PHQ-9 in the last 360 days      Passed - Last BP in normal range    BP Readings from Last 1 Encounters:  02/03/21 119/81          Passed - Valid encounter within last 6 months    Recent Outpatient Visits           4 months ago Type 2 diabetes mellitus with other circulatory complication, with long-term current use of insulin ([place])   [place], Benjamin Perez, MD   6 months ago Type 2 diabetes mellitus with other circulatory complication, with long-term current use of insulin ([place])   [place], [place], MD   7 months ago Type 2 diabetes mellitus with other circulatory complication, with long-term current use of insulin ([place])   [place], [place], MD   11 months ago Type 2 diabetes mellitus with other circulatory complication, with long-term current use of insulin ([place])   [place], [place], MD   1 year ago Type 2 diabetes mellitus with other circulatory complication, with long-term current use of insulin ([place])   [facility] Charlott Rakes, MD

## 2021-04-05 IMAGING — DX DG CHEST 1V
1 series · 1 of 1 positions shown · non-contrast
Comparison: 03/23/2020

CLINICAL DATA: Chest pain, history of congestive heart failure and
diabetes

EXAM:
CHEST  1 VIEW

[chest ap]
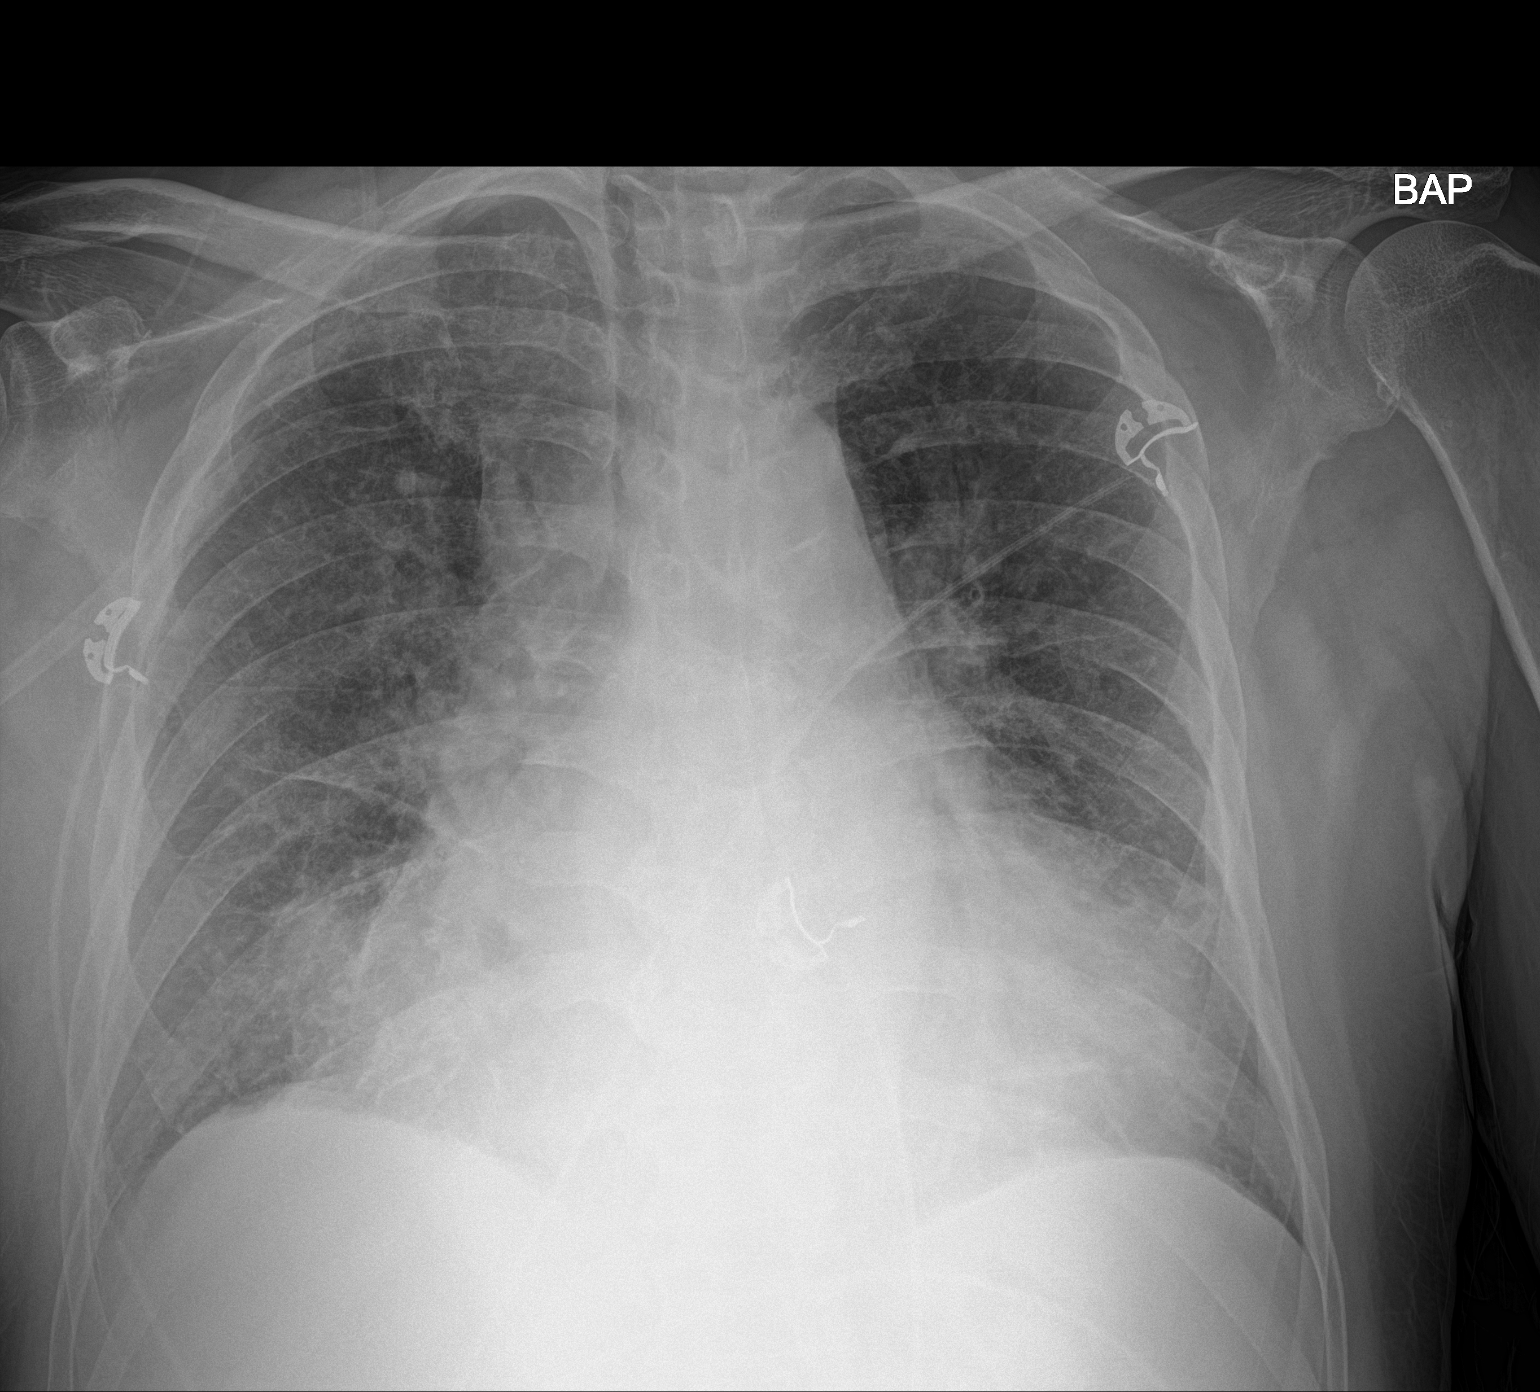

[1 of 1 positions shown; findings below may reference images not displayed]

FINDINGS: Single frontal view of the chest demonstrates an enlarged cardiac
silhouette. There is increased vascular congestion and interval
development of diffuse interstitial prominence. No dense
consolidation. No effusion or pneumothorax. No acute bony
abnormalities.
IMPRESSION: 1. Findings consistent with early congestive heart failure.

## 2021-04-08 ENCOUNTER — Other Ambulatory Visit (HOSPITAL_COMMUNITY): Payer: Self-pay | Admitting: Cardiology

## 2021-04-08 DIAGNOSIS — I110 Hypertensive heart disease with heart failure: Secondary | ICD-10-CM

## 2021-04-09 ENCOUNTER — Other Ambulatory Visit: Payer: Self-pay | Admitting: Family Medicine

## 2021-04-09 DIAGNOSIS — Z794 Long term (current) use of insulin: Secondary | ICD-10-CM

## 2021-04-09 DIAGNOSIS — E1159 Type 2 diabetes mellitus with other circulatory complications: Secondary | ICD-10-CM

## 2021-04-09 NOTE — Telephone Encounter (Signed)
Medication Refill - Medication: glucose blood (ACCU-CHEK GUIDE) test strip    Patient stated that he needs enough strips to check his sugar 3x daily, and the pharmacy said the script would need to be changed to indicate that.    Preferred Pharmacy (with phone number or street name):  [facility] [place] Phone:  [phone]  Fax:  [phone]       Agent: Please be advised that RX refills may take up to 3 business days. We ask that you follow-up with your pharmacy.

## 2021-04-09 NOTE — Telephone Encounter (Signed)
  Notes to clinic:   Patient stated that he needs enough strips to check his sugar 3x daily, and the pharmacy said the script would need to be changed to indicate that.  Requested Prescriptions  Pending Prescriptions Disp Refills   glucose blood (ACCU-CHEK GUIDE) test strip 100 each 12    Sig: Use as instructed daily      Endocrinology: Diabetes - Testing Supplies Passed - 04/09/2021  9:49 AM      Passed - Valid encounter within last 12 months    Recent Outpatient Visits           4 months ago Type 2 diabetes mellitus with other circulatory complication, with long-term current use of insulin ([place])   [place], [place], MD   6 months ago Type 2 diabetes mellitus with other circulatory complication, with long-term current use of insulin ([place])   [place], [place], MD   8 months ago Type 2 diabetes mellitus with other circulatory complication, with long-term current use of insulin ([place])   [place], [place], MD   11 months ago Type 2 diabetes mellitus with other circulatory complication, with long-term current use of insulin ([place])   [place], [place], MD   1 year ago Type 2 diabetes mellitus with other circulatory complication, with long-term current use of insulin ([place])   [place] Charlott Rakes, MD

## 2021-04-21 IMAGING — CR DG CHEST 2V
2 series · 2 of 2 positions shown · non-contrast
Comparison: 04/02/2020

CLINICAL DATA: Chest pain

EXAM:
CHEST - 2 VIEW

[chest pa]
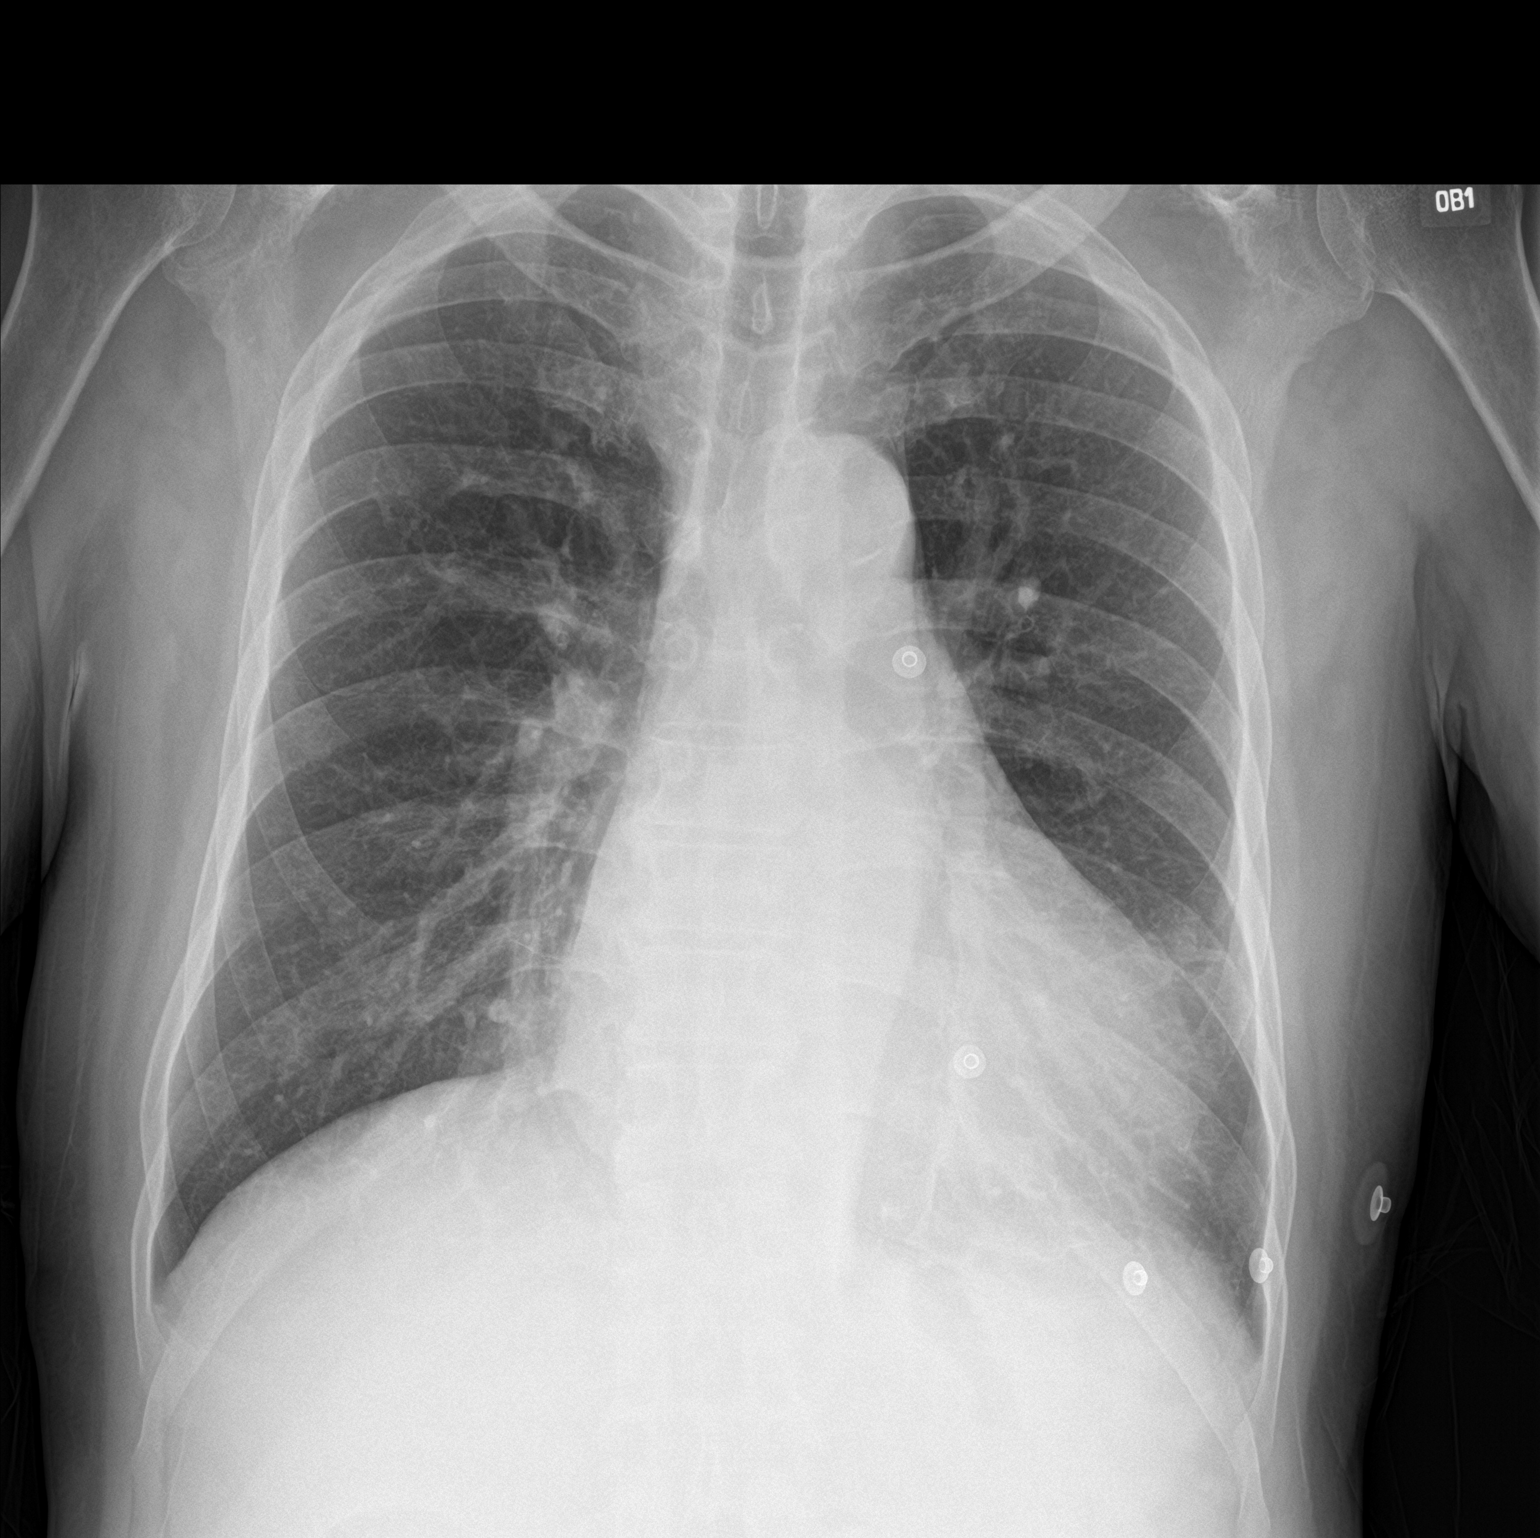

[chest lat]
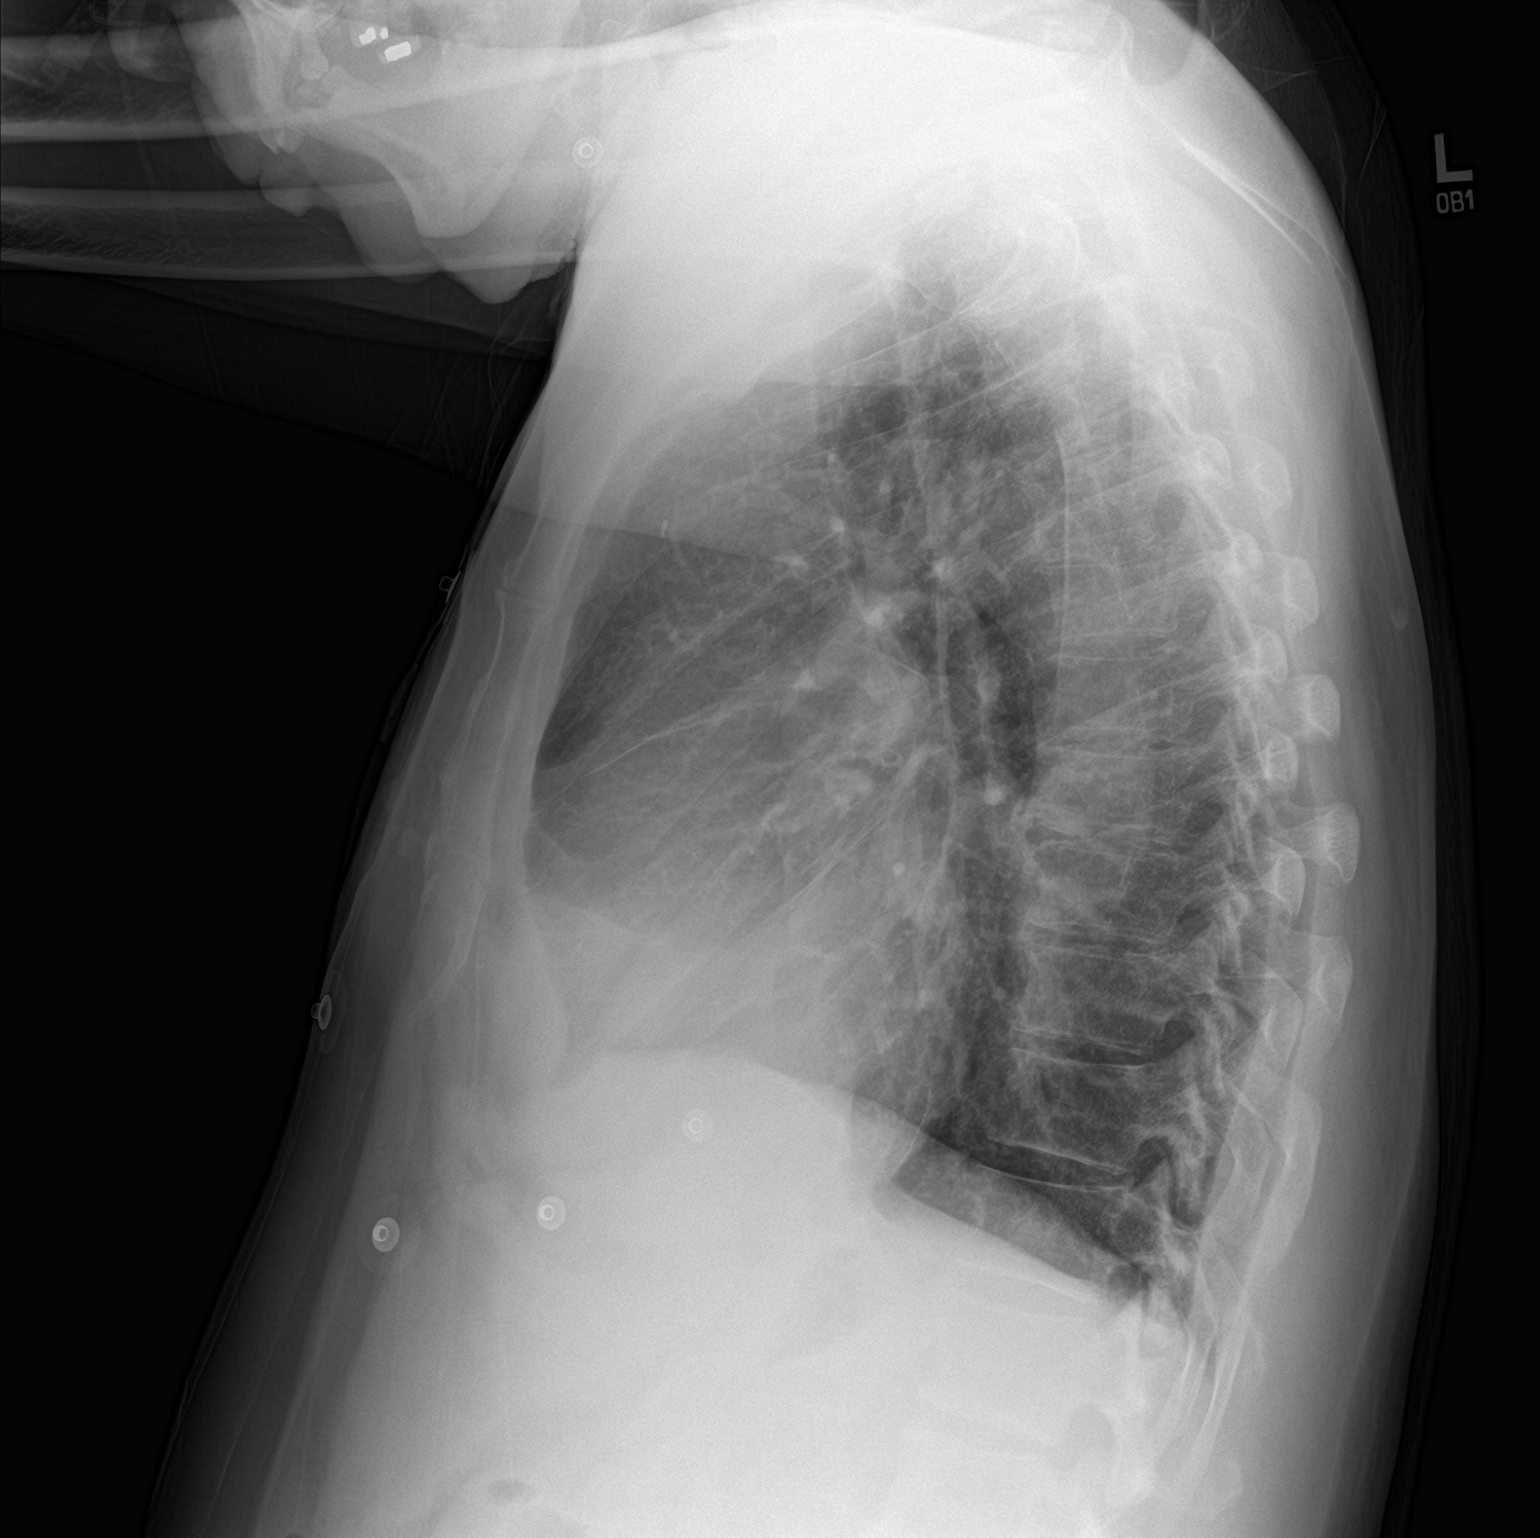

[2 of 2 positions shown; findings below may reference images not displayed]

FINDINGS: Cardiac shadow is mildly enlarged but stable. Aortic calcifications
again seen. Lungs are well aerated bilaterally. No focal infiltrate
or sizable effusion is seen. No bony abnormality is noted.
IMPRESSION: No active cardiopulmonary disease.

## 2021-05-06 IMAGING — CT CT CERVICAL SPINE W/O CM
3 of 4 series · 13 of 33 positions shown, 16 images · non-contrast
Comparison: None.

CLINICAL DATA: Posterior neck pain

EXAM:
CT CERVICAL SPINE WITHOUT CONTRAST
TECHNIQUE: Multidetector CT imaging of the cervical spine was performed without
intravenous contrast. Multiplanar CT image reconstructions were also
generated.

[Series 4: c_spine 2.0 st · axial · 0.30mm/px · z∈[-284,-162]mm · 5 of 93 slices shown, 7 images]
[im 16/93  soft-tissue]
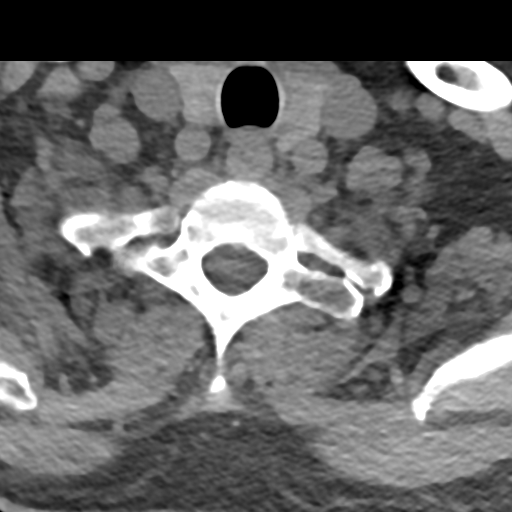
[im 16/93  bone]
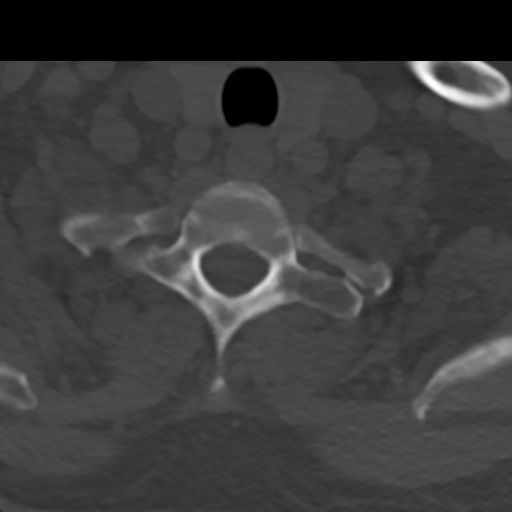
[im 31/93  bone]
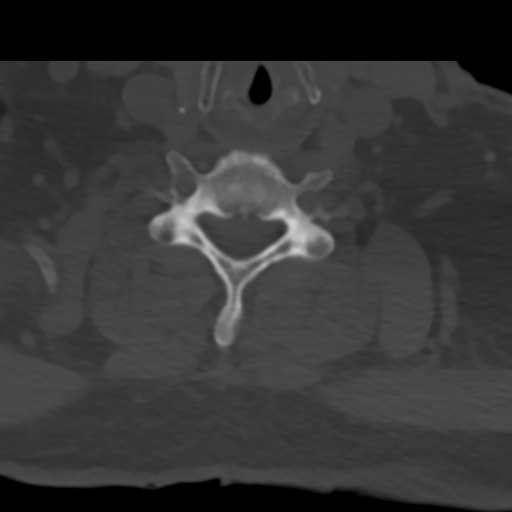
[im 47/93  bone]
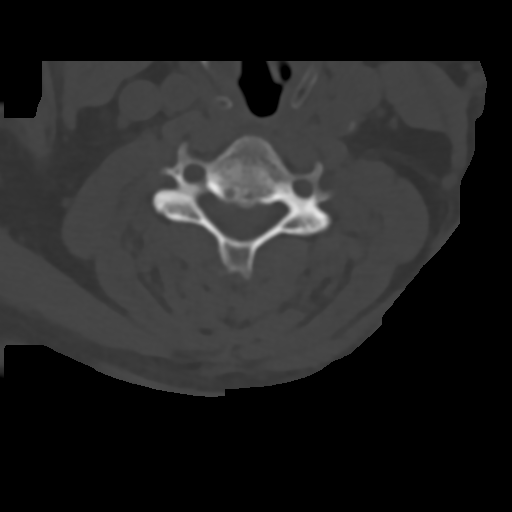
[im 62/93  bone]
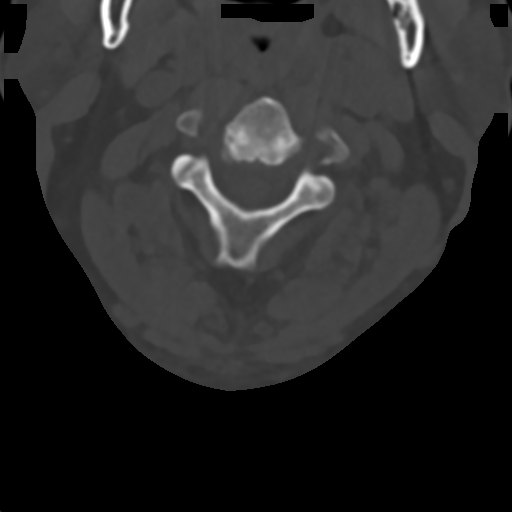
[im 77/93  soft-tissue]
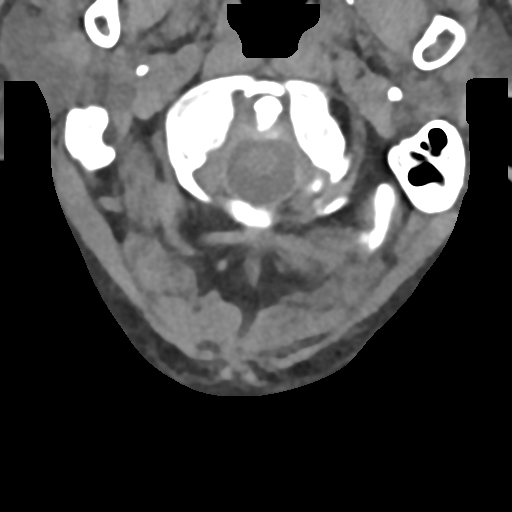
[im 77/93  bone]
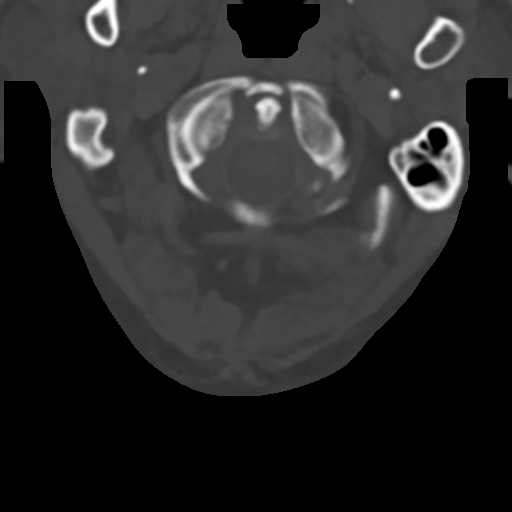

[Series 6: c_spine 2.0 sag bone · sagittal · 0.29mm/px · 5 of 61 slices shown, 6 images]
[im 21/61  bone]
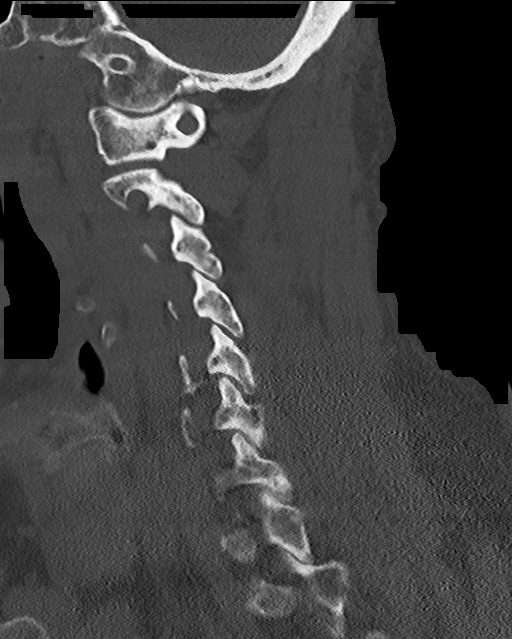
[im 26/61  bone]
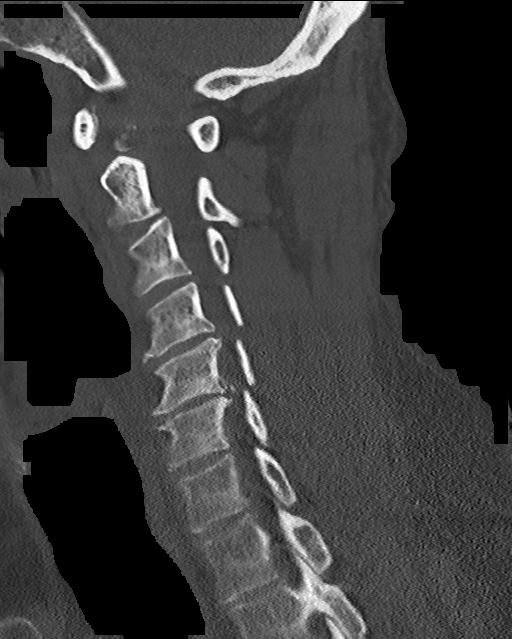
[im 31/61  soft-tissue]
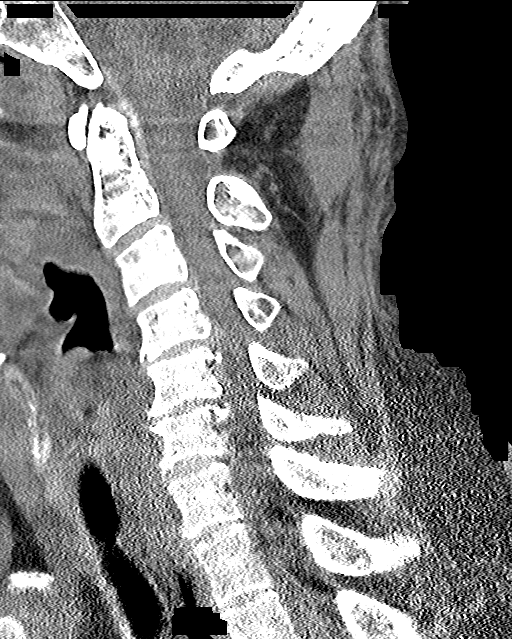
[im 31/61  bone]
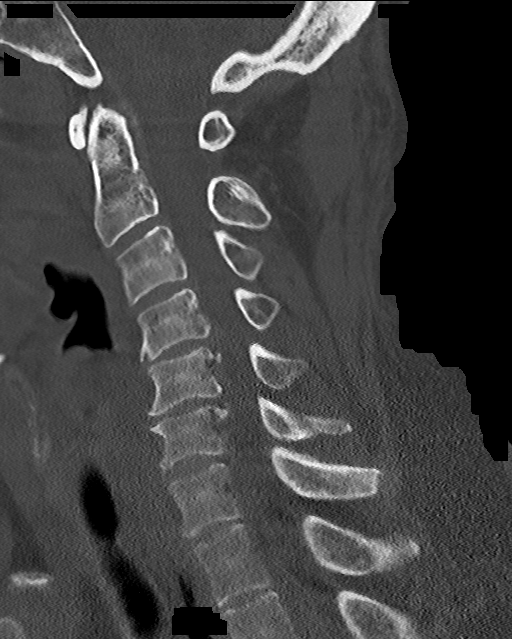
[im 36/61  bone]
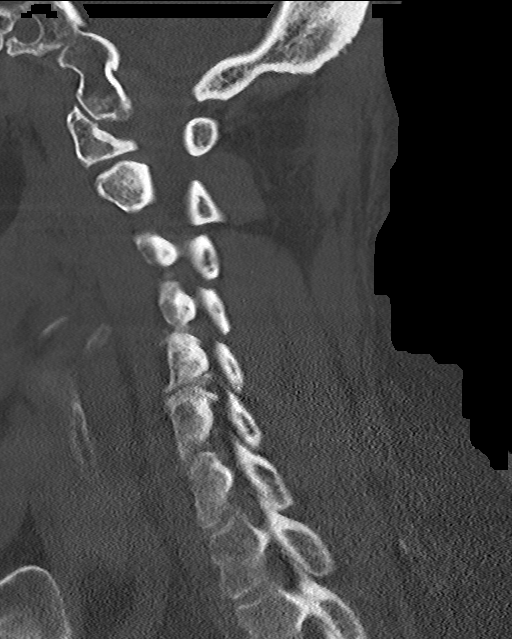
[im 41/61  bone]
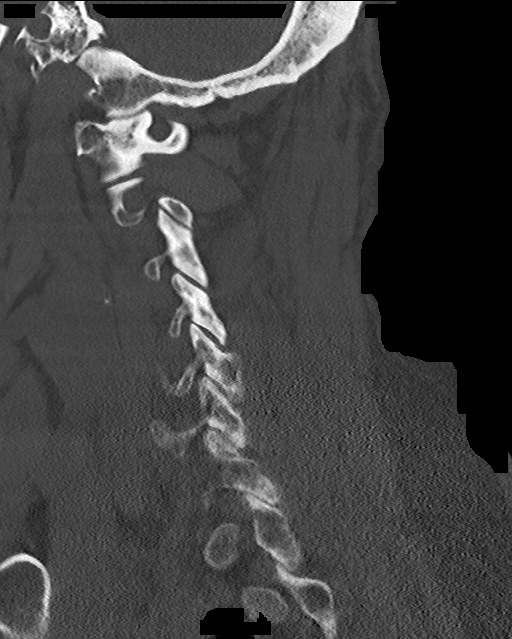

[Series 7: c_spine 2.0 cor bone · coronal · 0.27mm/px · 3 of 73 slices shown]
[im 15/73  bone]
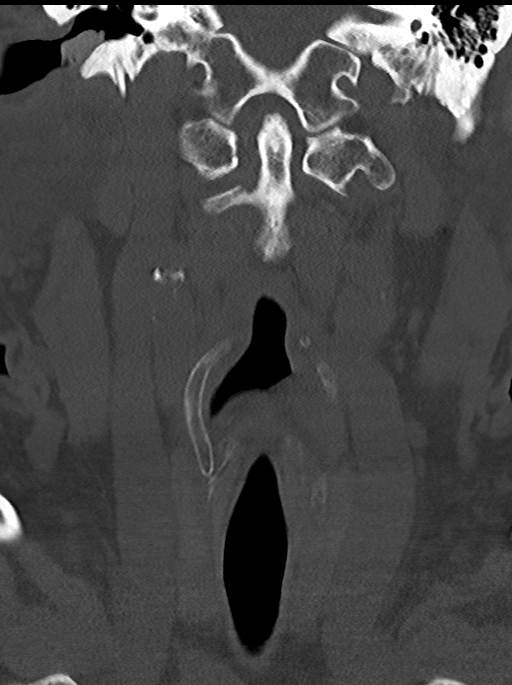
[im 29/73  bone]
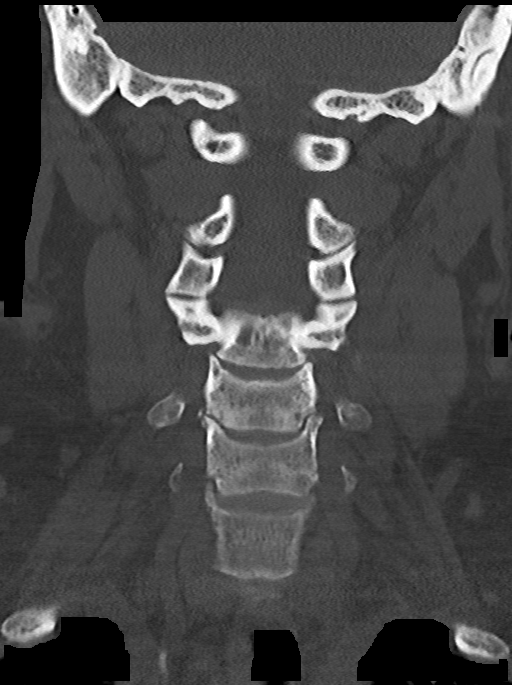
[im 44/73  bone]
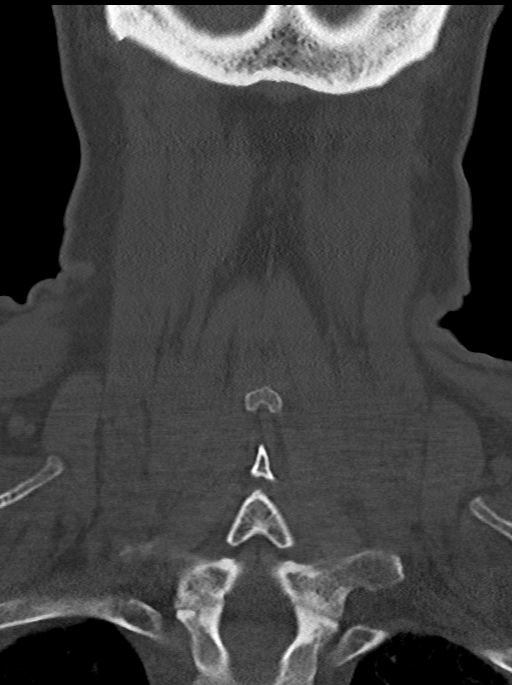

[13 of 33 positions shown; findings below may reference images not displayed]

FINDINGS: Alignment: There is straightening of the normal cervical lordosis.

Skull base and vertebrae: Visualized skull base is intact. No
atlanto-occipital dissociation. The vertebral body heights are well
maintained. No fracture or pathologic osseous lesion seen.

Soft tissues and spinal canal: The visualized paraspinal soft
tissues are unremarkable. No prevertebral soft tissue swelling is
seen. The spinal canal is grossly unremarkable, no large epidural
collection or significant canal narrowing.

Disc levels: Multilevel cervical spine spondylosis is seen with
small anterior osteophytes, disc osteophyte complex and
uncovertebral osteophytes most notable at C5-C6 with severe neural
foraminal narrowing and moderate central canal stenosis.

Upper chest: The lung apices are clear. Thoracic inlet is within
normal limits.

Other: None
IMPRESSION: No acute fracture or malalignment.

Cervical spine spondylosis most notable C5-C6 with severe neural
foraminal narrowing and moderate central canal stenosis.

## 2021-05-13 ENCOUNTER — Telehealth: Payer: Self-pay | Admitting: Family Medicine

## 2021-05-13 NOTE — Telephone Encounter (Signed)
Pt is deceased. 

## 2021-05-13 NOTE — Telephone Encounter (Signed)
Called to check on the status of a certificate of necessity that was faxed to Dr. Margarita Rana.  The fax should have come from [place].  Please advise and call to discuss at [phone]

## 2021-06-01 IMAGING — CR DG CHEST 2V
2 series · 2 of 2 positions shown · non-contrast
Comparison: Radiograph 04/18/2020

CLINICAL DATA: Chest pain, CHF, AFib

EXAM:
CHEST - 2 VIEW

[w chest lat]
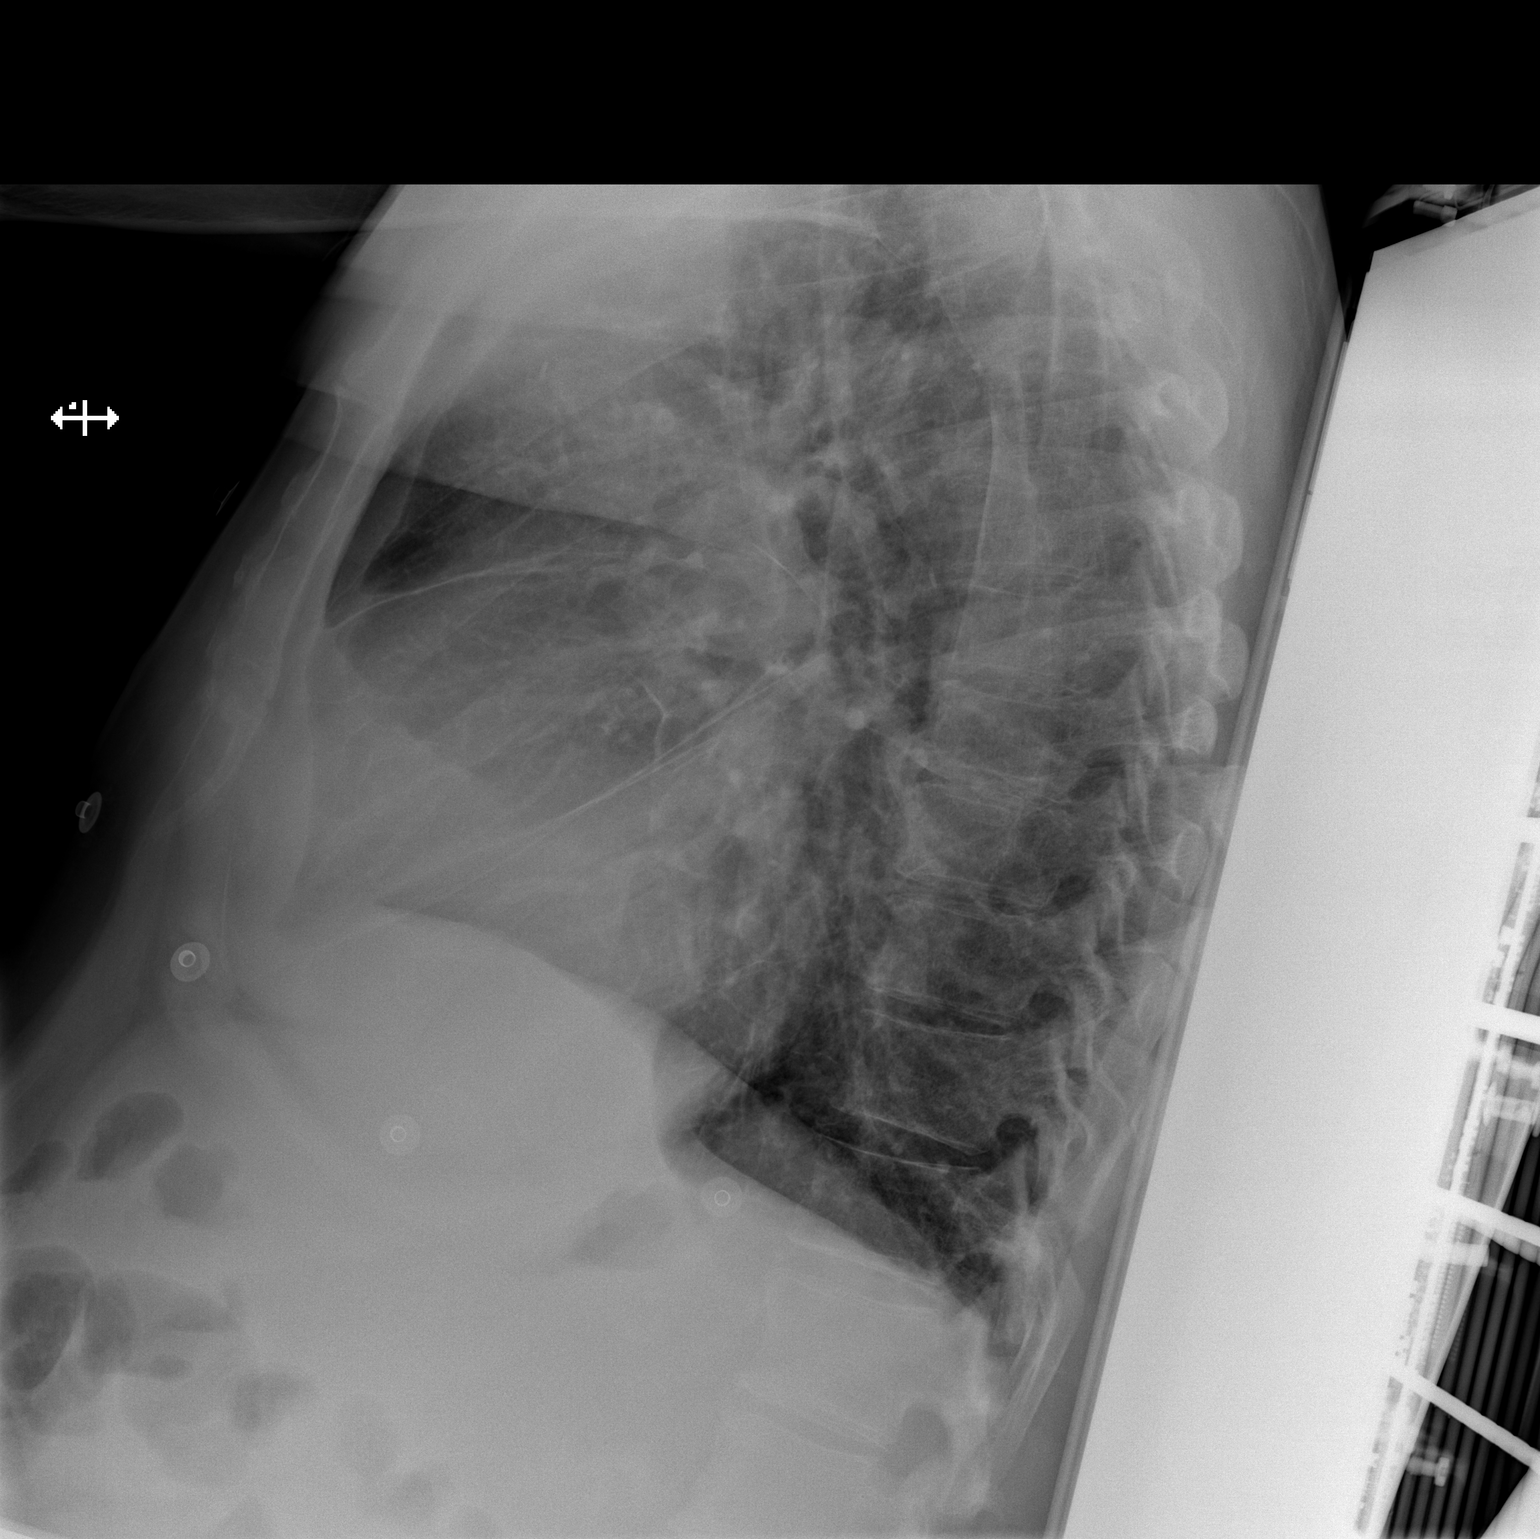

[x chest ap]
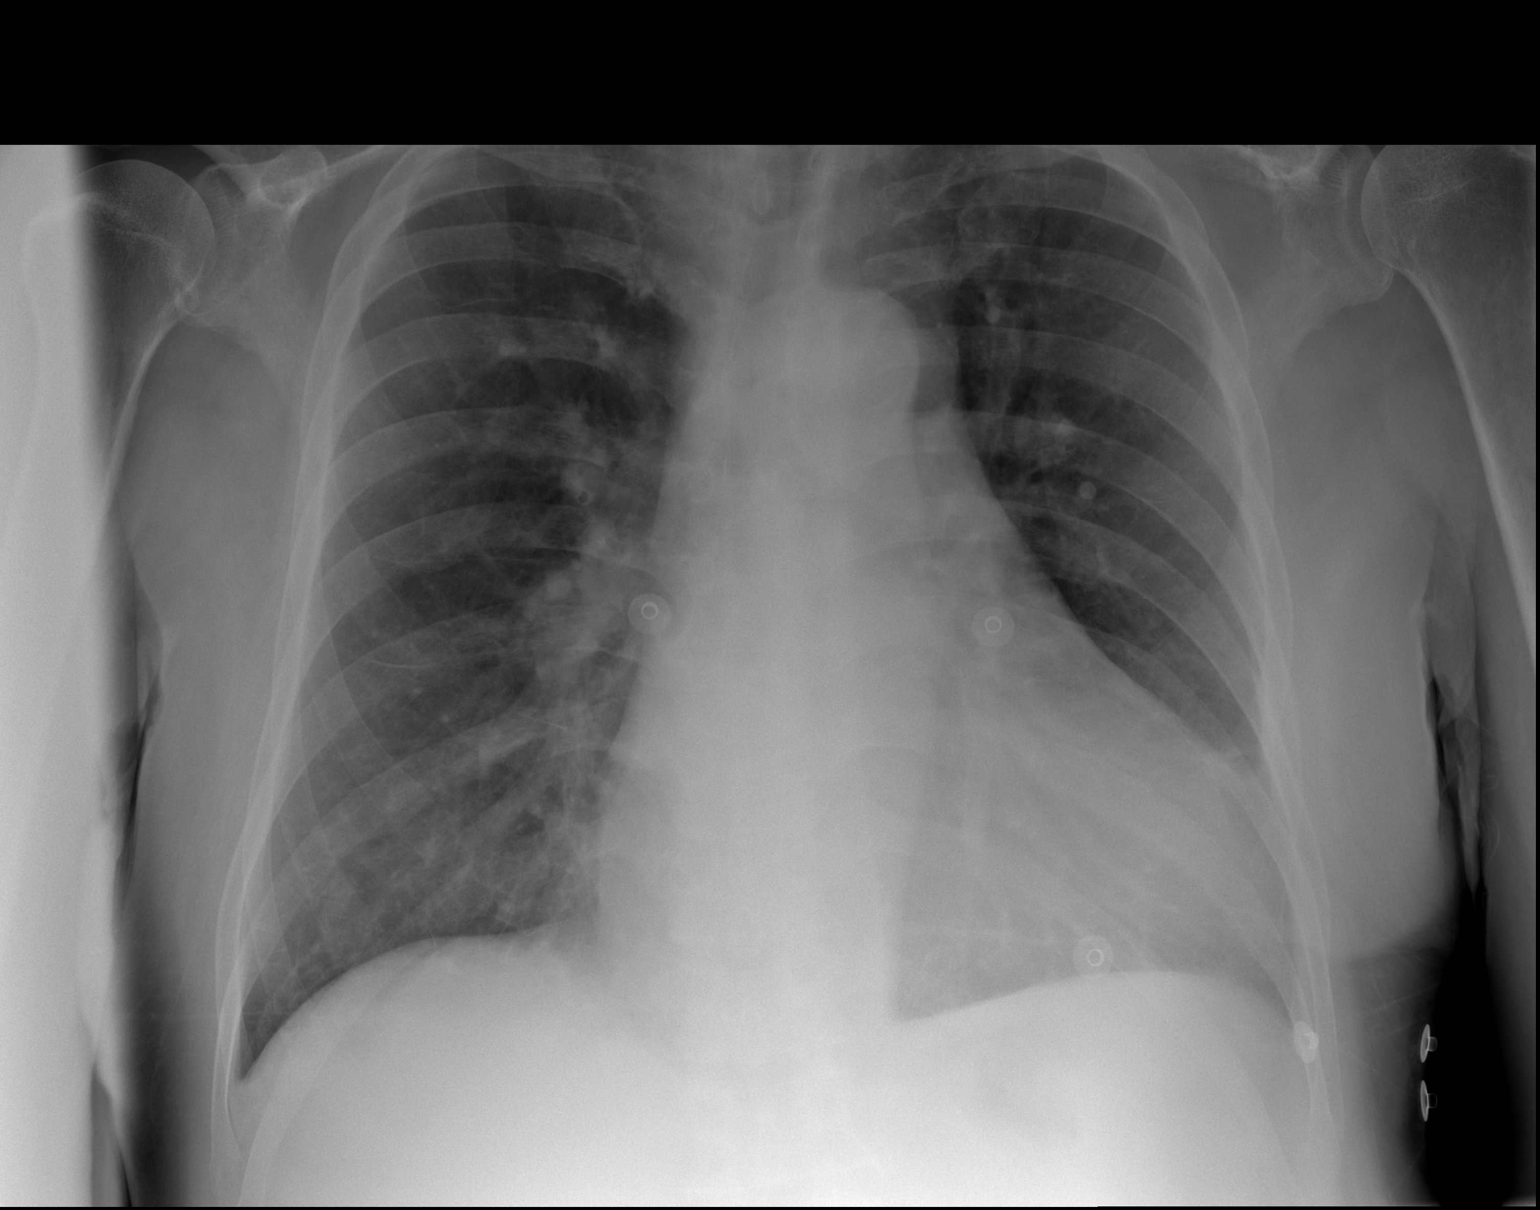

[2 of 2 positions shown; findings below may reference images not displayed]

FINDINGS: Some increasing hazy interstitial opacities with redistributed
pulmonary vascularity and central peribronchovascular cuffing as
well as some mild fissural and septal thickening. No focal
consolidation. No pneumothorax or visible effusion. Borderline
cardiomegaly. The aorta is calcified. The remaining
cardiomediastinal contours are unremarkable. No acute osseous or
soft tissue abnormality.
IMPRESSION: Findings suggestive of mild CHF/volume overload with borderline
cardiomegaly and interstitial edema.

## 2021-06-03 NOTE — Telephone Encounter (Signed)
[place] calling from [place] is calling to follow up on the certificate of necessity that was faxed to Dr. Margarita Rana.  [place] is aware that the pt is deceased. And is in need of the paperwork to for the insurance to continue the coverage - needing it to cover the remaining days that the pt was able to use the equipent. [phone]

## 2021-06-13 IMAGING — DX DG CHEST 2V
3 series · 3 of 3 positions shown · non-contrast
Comparison: Radiograph 05/29/2020.

CLINICAL DATA: Central nonradiating chest pain.

EXAM:
CHEST - 2 VIEW

[chest lat (1 of 2)]
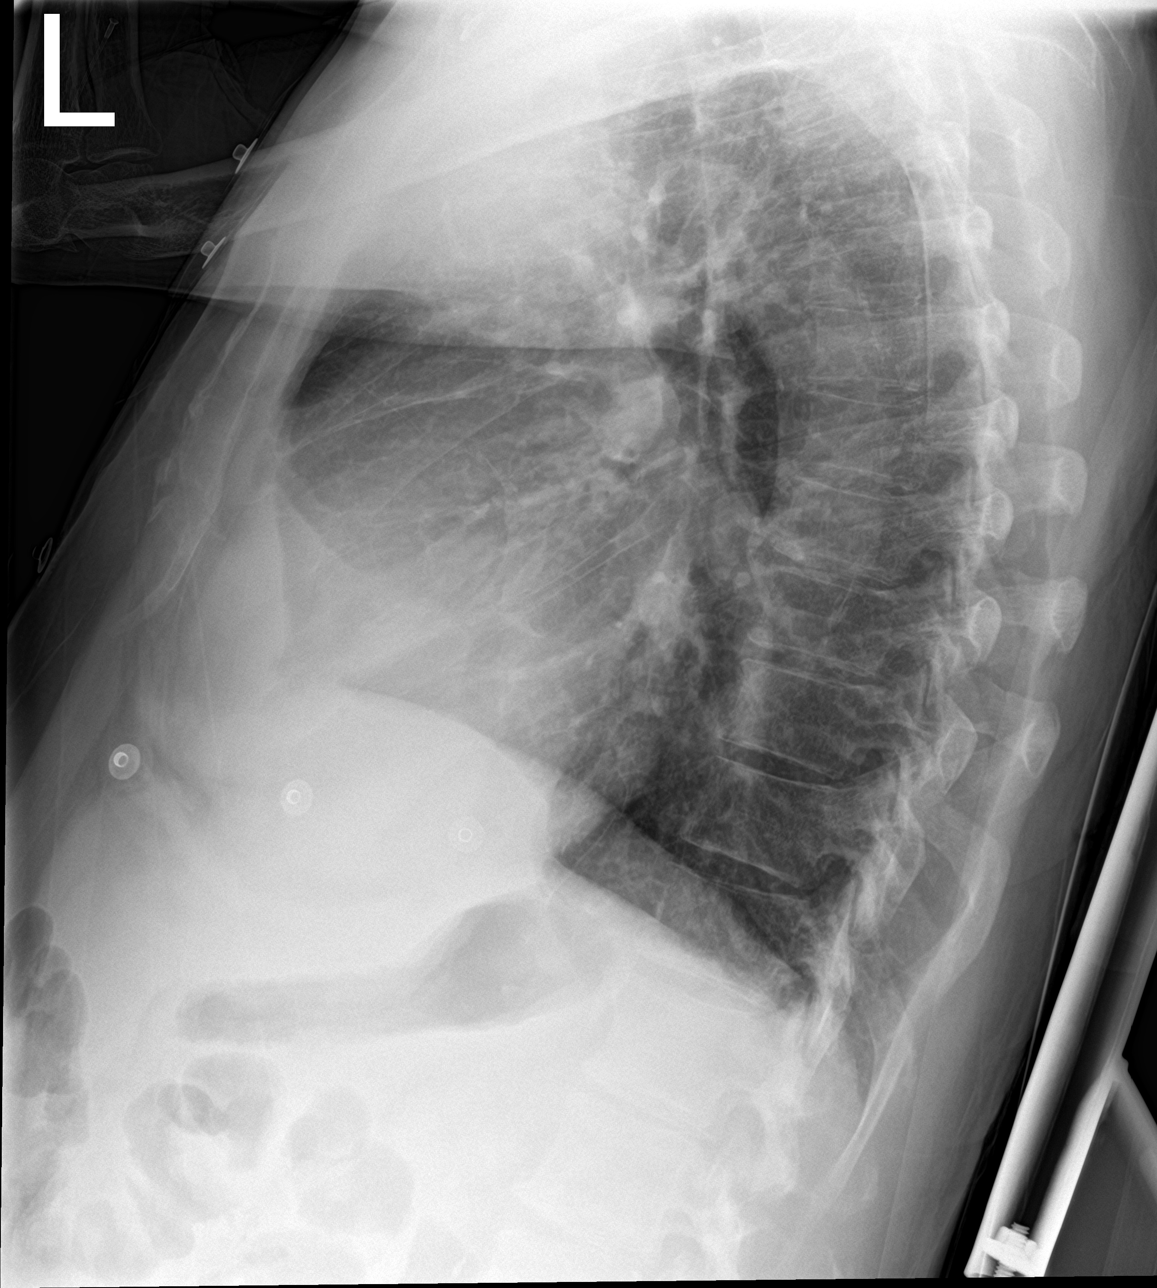

[chest ap]
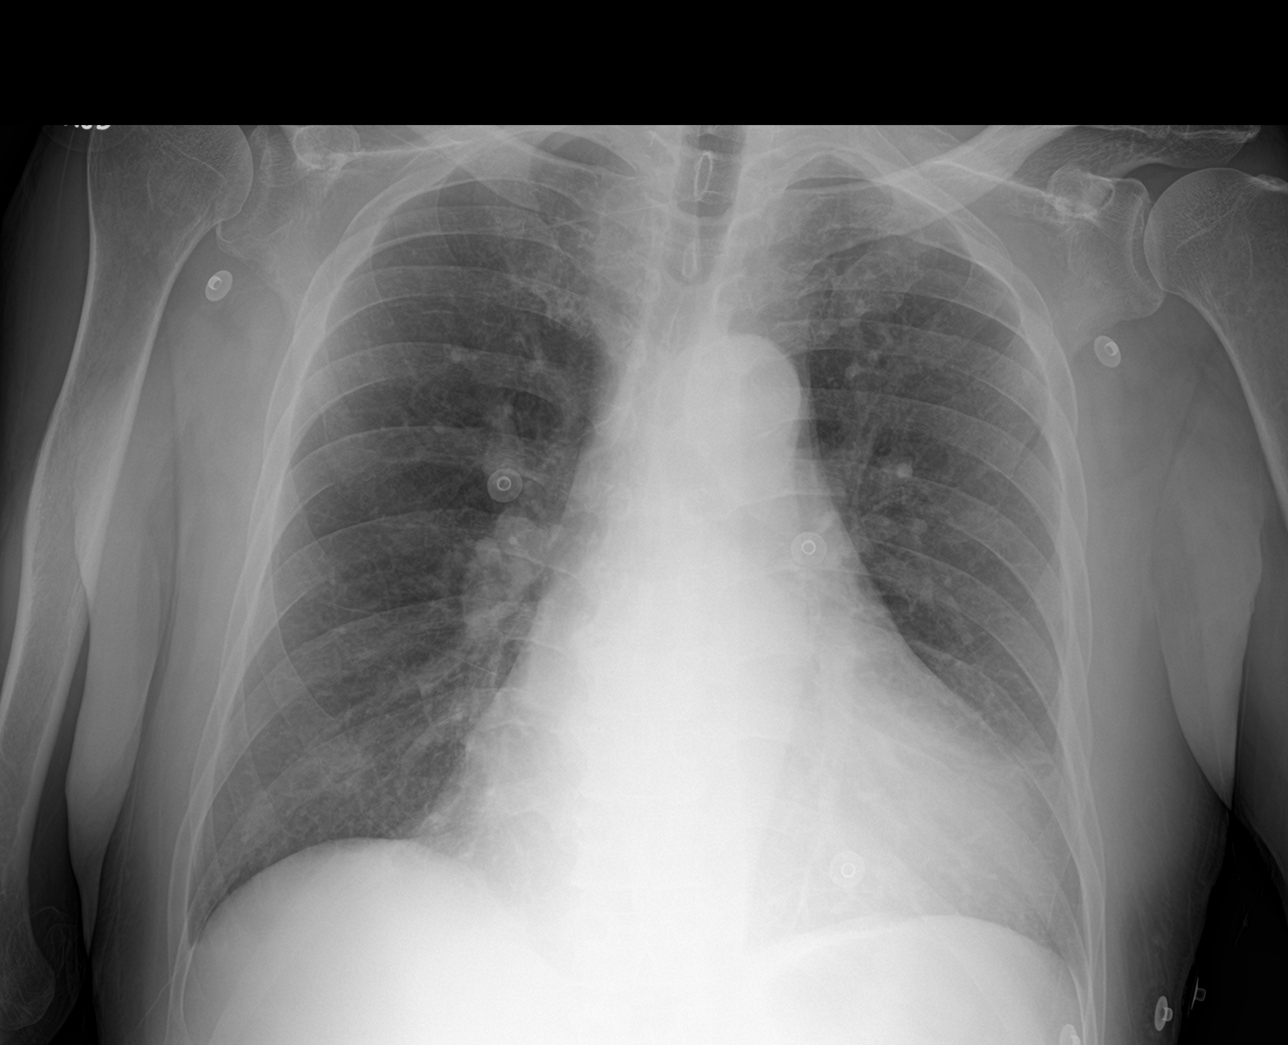

[chest lat (2 of 2)]
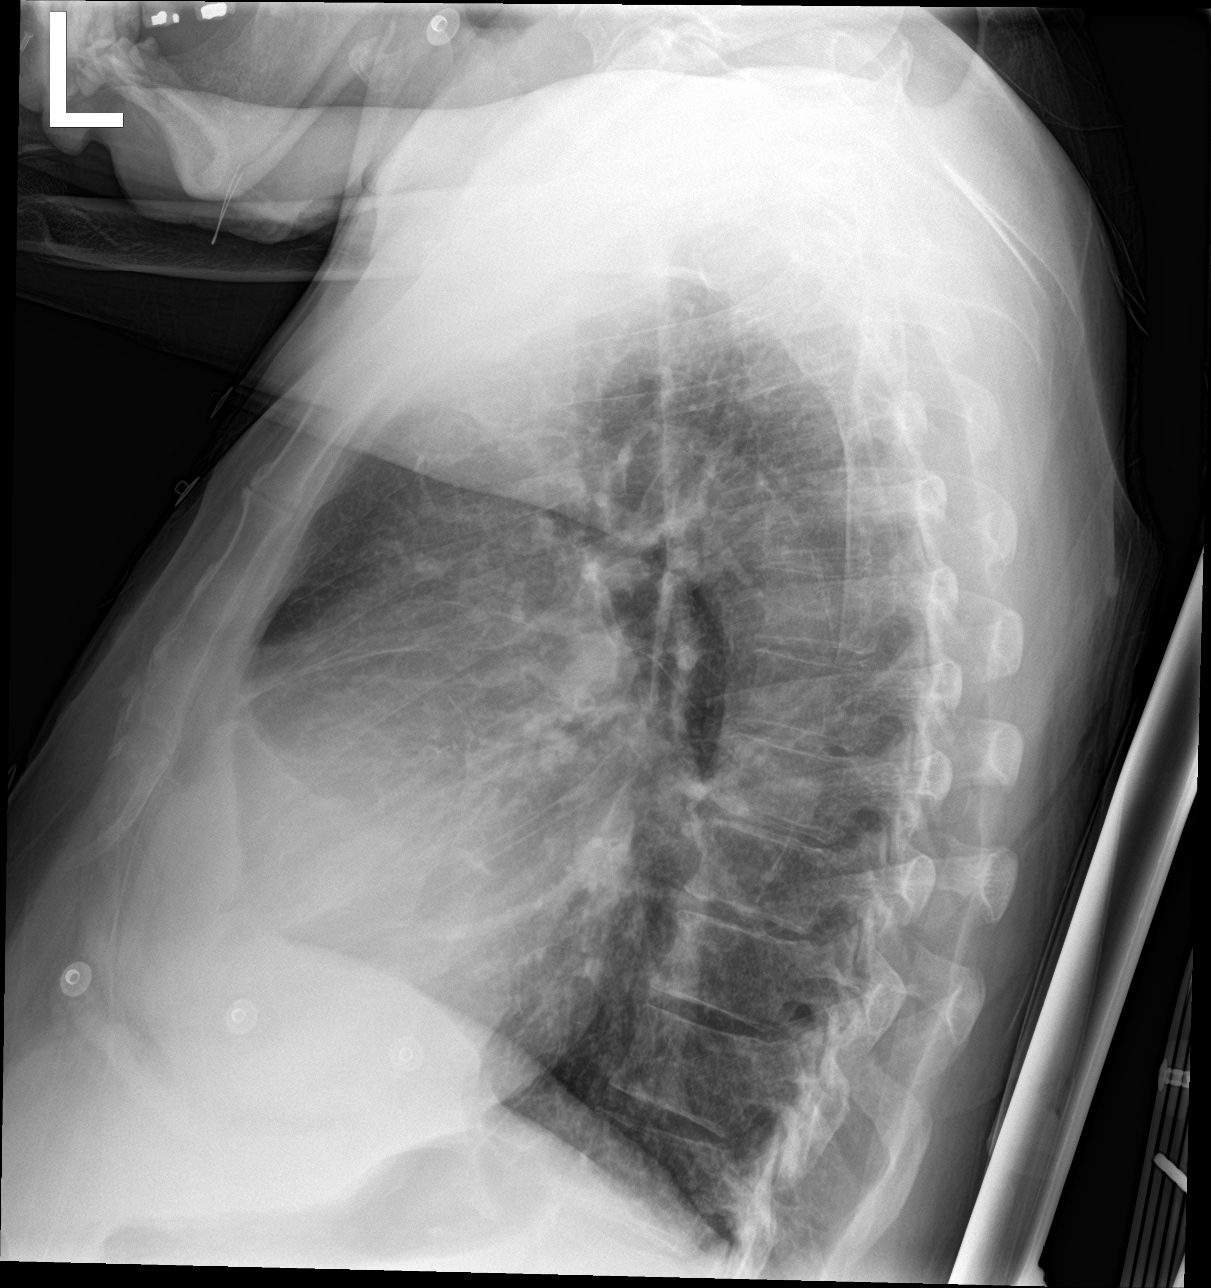

[3 of 3 positions shown; findings below may reference images not displayed]

FINDINGS: Chronic cardiomegaly with unchanged mediastinal contours. Aortic
atherosclerosis. No significant pleural effusion. Mild vascular
congestion without pulmonary edema. No focal airspace disease. No
pneumothorax. No acute osseous abnormalities are seen.
IMPRESSION: Chronic cardiomegaly with mild vascular congestion.

## 2021-06-24 IMAGING — DX DG CHEST 1V PORT
1 series · 2 of 2 positions shown · non-contrast
Comparison: 06/10/2020

CLINICAL DATA: Chest pain

EXAM:
PORTABLE CHEST 1 VIEW

[Series 1: chest · 0.14mm/px · 2 of 2 slices shown]
[im 1/2]
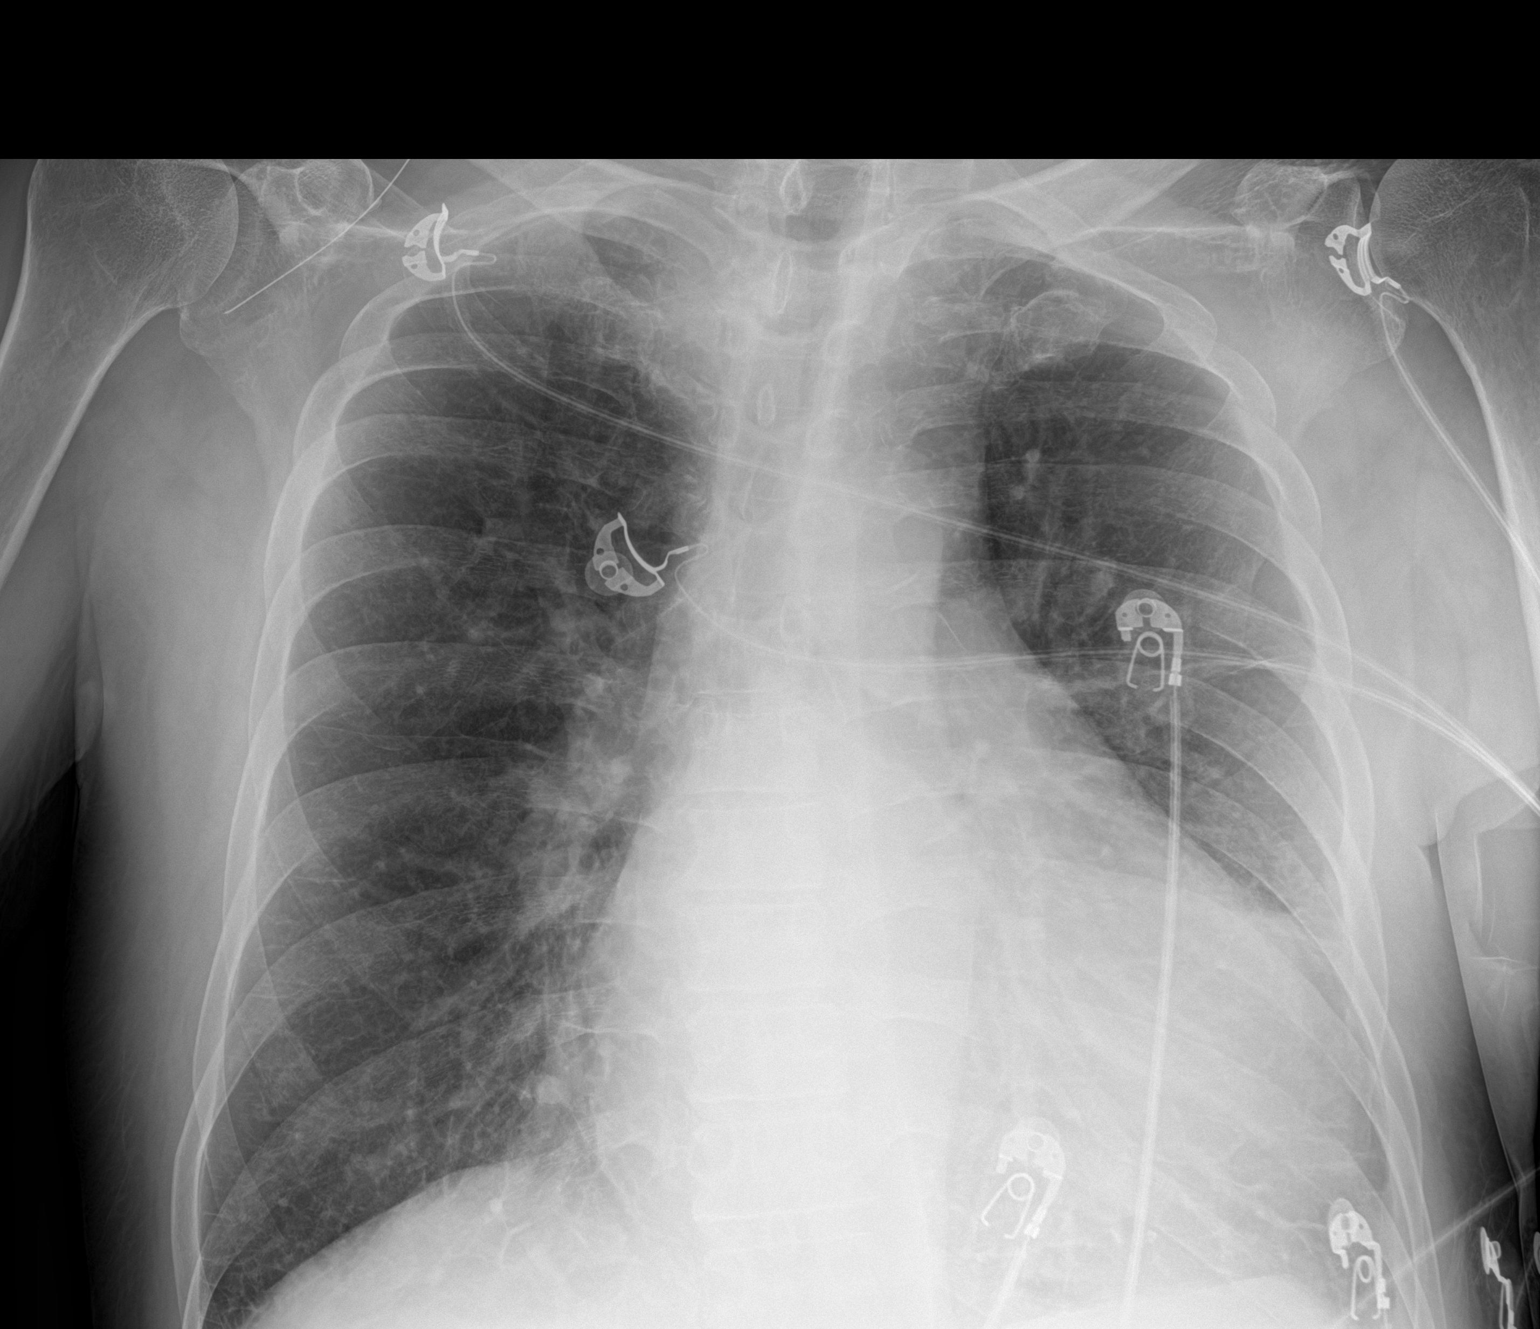
[im 2/2]
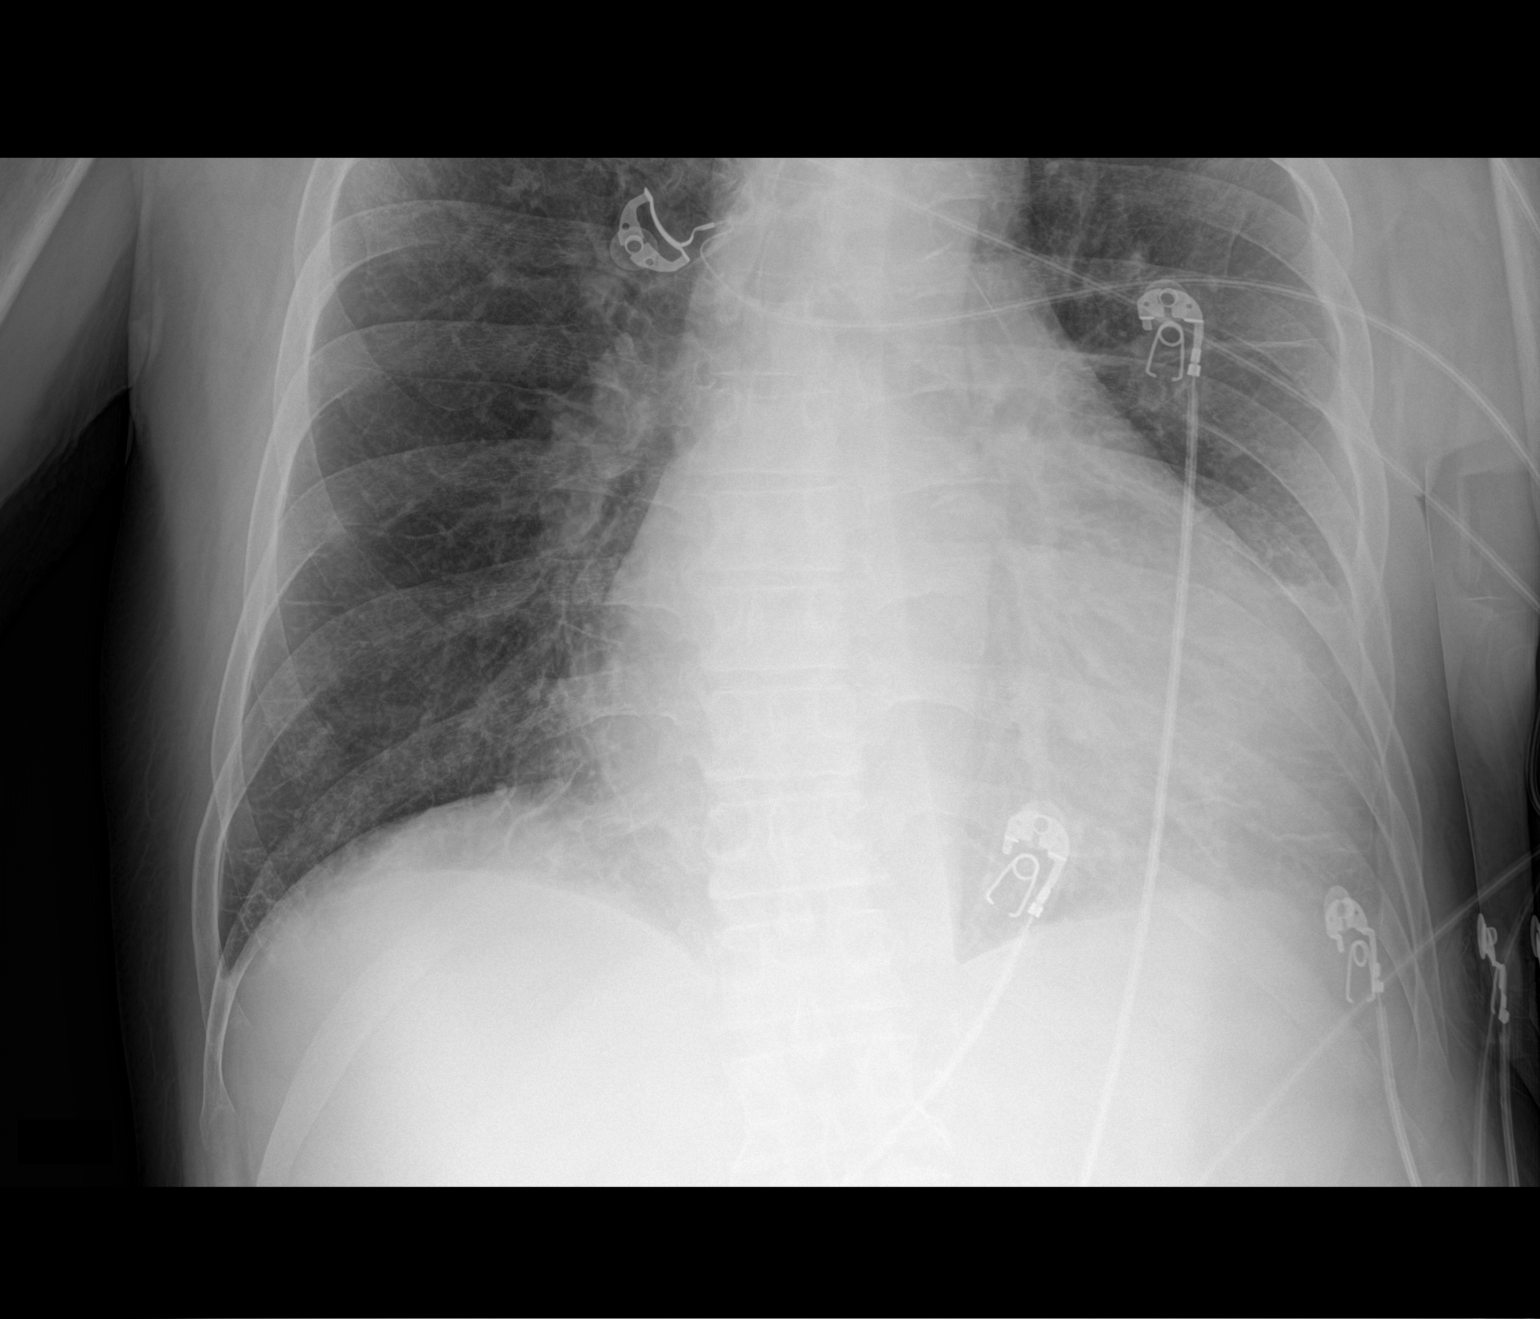

[2 of 2 positions shown; findings below may reference images not displayed]

FINDINGS: Cardiomegaly. There are a few Kerley lines not seen previously. No
visible effusion or pneumothorax
IMPRESSION: Cardiomegaly and mild interstitial edema.

## 2021-06-24 IMAGING — CT CT ANGIO CHEST-ABD-PELV FOR DISSECTION W/ AND WO/W CM
2 of 7 series · 14 of 46 positions shown, 16 images · IV contrast (OMNI)
Comparison: 12/31/2019 abdominal CTA

CLINICAL DATA: Abdominal pain with aortic dissection suspected.
Sudden onset left lower quadrant pain

EXAM:
CT ANGIOGRAPHY CHEST, ABDOMEN AND PELVIS
TECHNIQUE: Non-contrast CT of the chest was initially obtained.

[Series 6: dissection 3.0 i30f 3 · axial · 0.68mm/px · z∈[+708,+1308]mm · 11 of 228 slices shown, 13 images]
[im 14/228  soft-tissue]
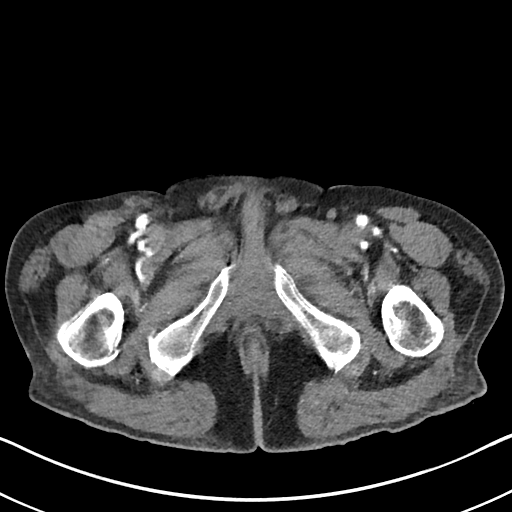
[im 14/228  bone]
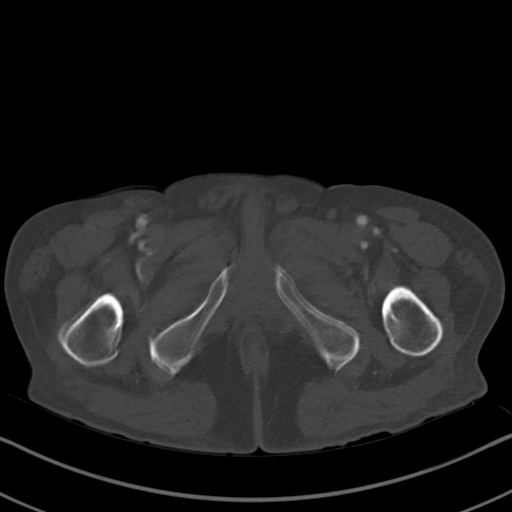
[im 41/228  soft-tissue]
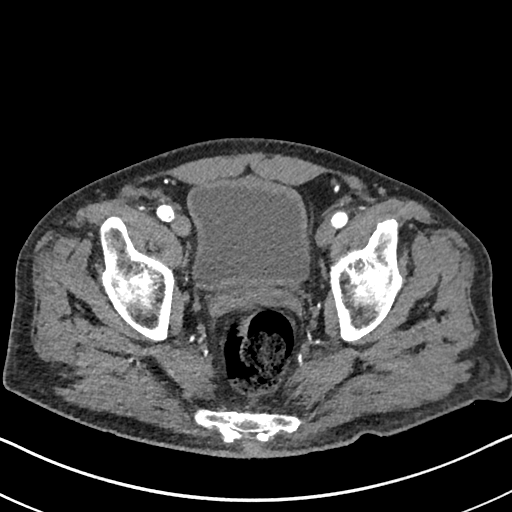
[im 54/228  soft-tissue]
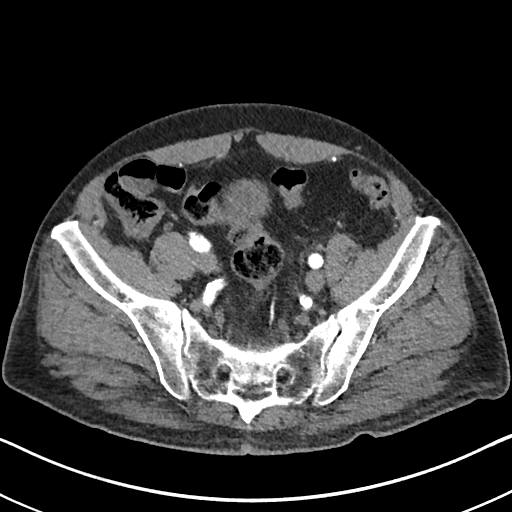
[im 81/228  soft-tissue]
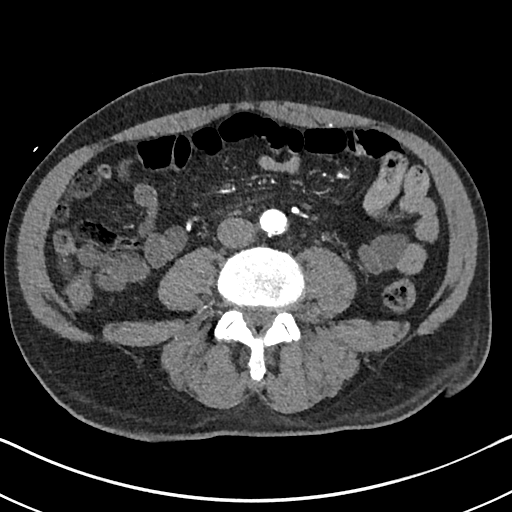
[im 94/228  soft-tissue]
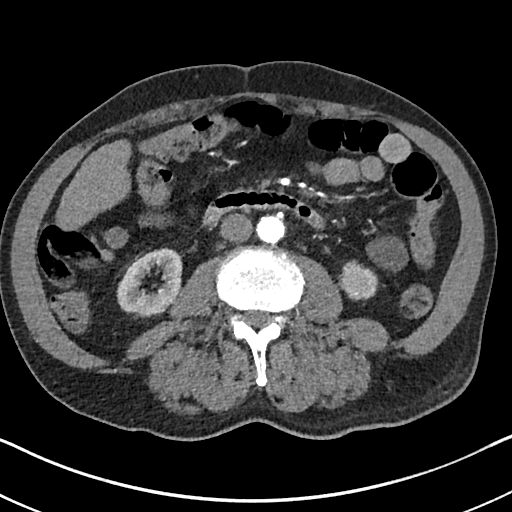
[im 121/228  soft-tissue]
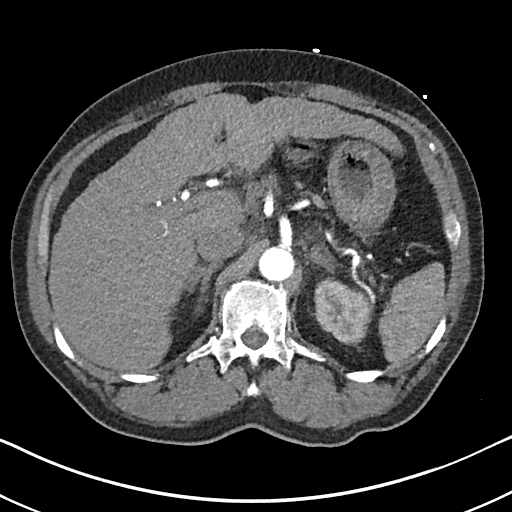
[im 134/228  soft-tissue]
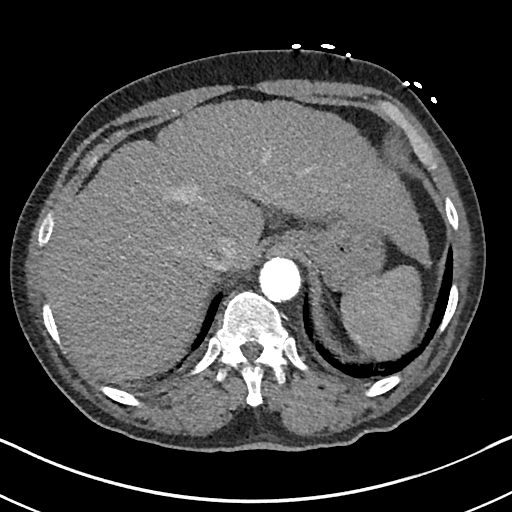
[im 147/228  soft-tissue]
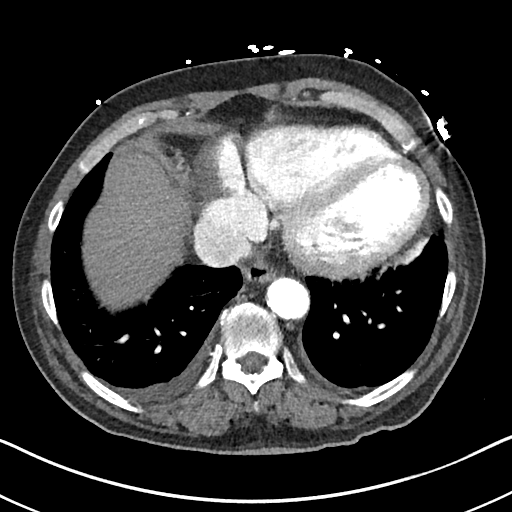
[im 174/228  soft-tissue]
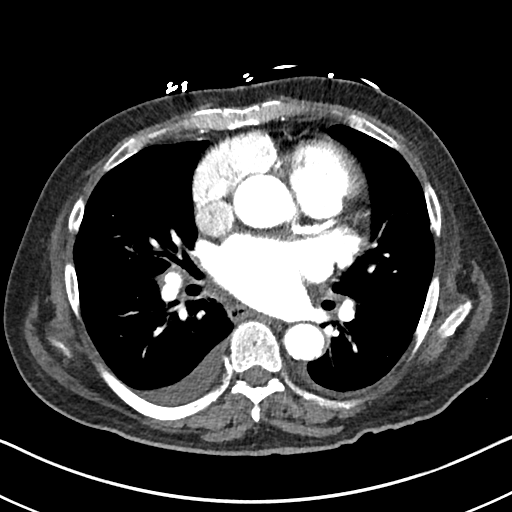
[im 174/228  bone]
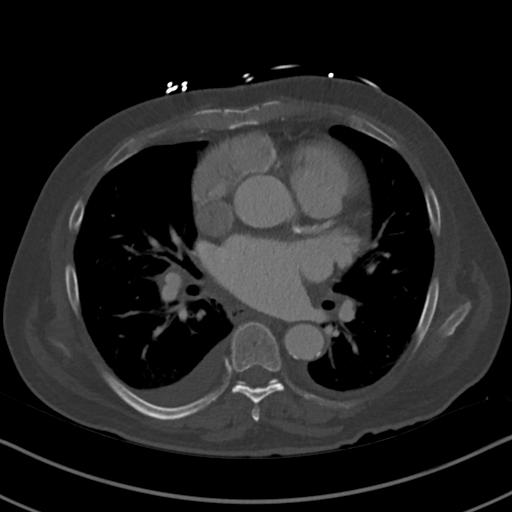
[im 187/228  soft-tissue]
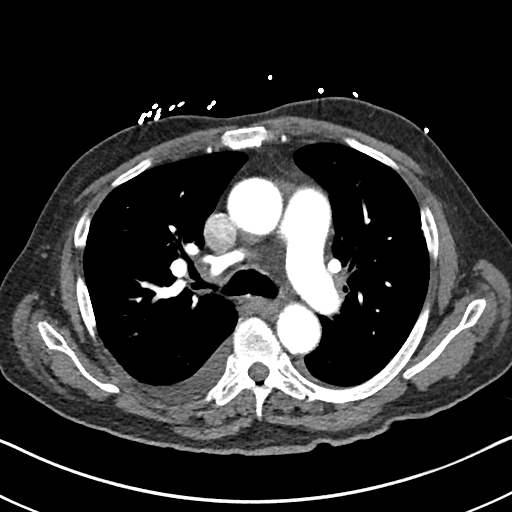
[im 214/228  soft-tissue]
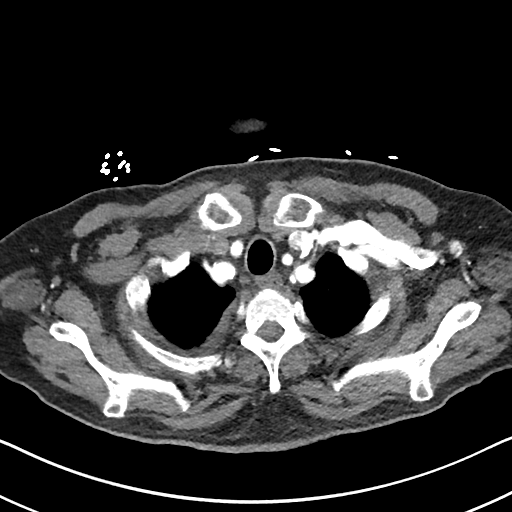

[Series 9: coronals · coronal · 0.69mm/px · 3 of 151 slices shown]
[im 38/151  soft-tissue]
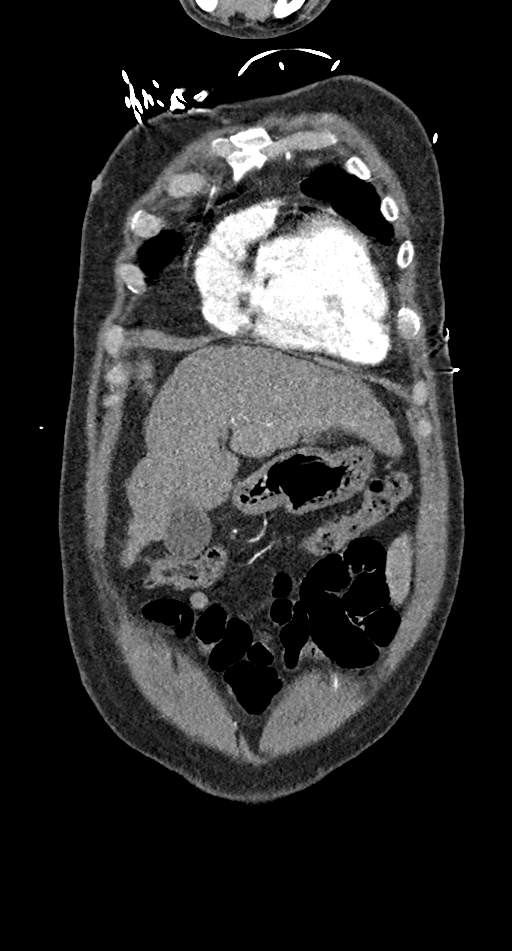
[im 76/151  soft-tissue]
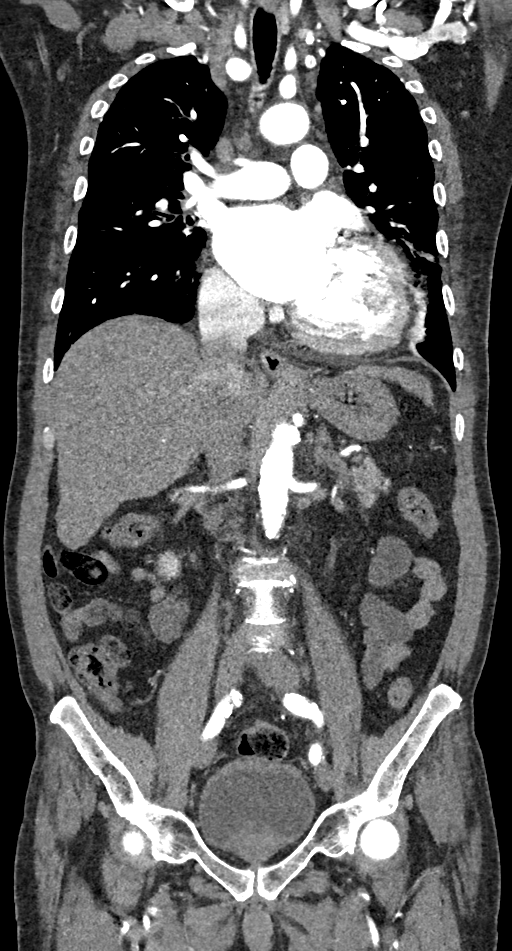
[im 113/151  soft-tissue]
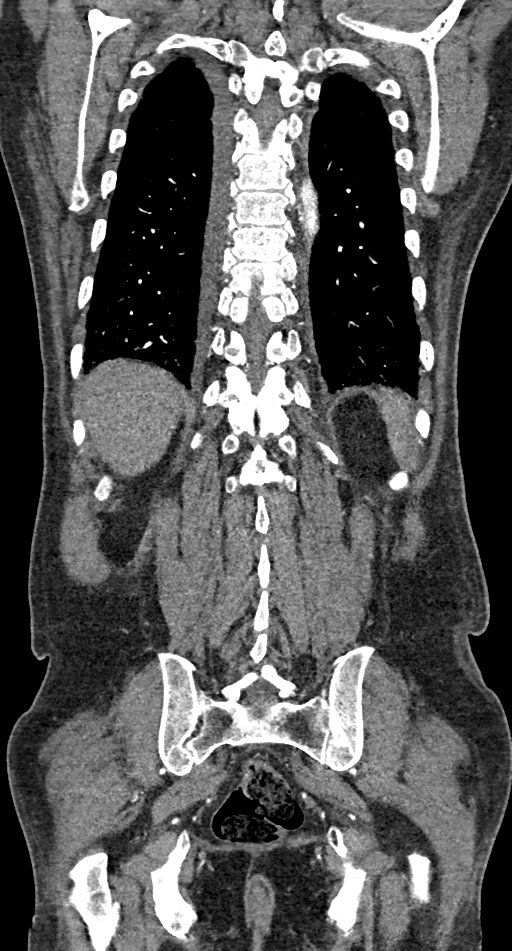

[14 of 46 positions shown; findings below may reference images not displayed]

Multidetector CT imaging through the chest, abdomen and pelvis was
performed using the standard protocol during bolus administration of
intravenous contrast. Multiplanar reconstructed images and MIPs were
obtained and reviewed to evaluate the vascular anatomy.

CONTRAST:  100mL OMNIPAQUE IOHEXOL 350 MG/ML SOLN
FINDINGS: CTA CHEST FINDINGS

Cardiovascular: No intramural hematoma in the aorta on the
noncontrast phase. Extensive aortic and coronary atherosclerotic
calcification. Preferential opacification of the thoracic aorta. No
evidence of thoracic aortic aneurysm or dissection. Enlarged heart
size. No pericardial effusion.

Mediastinum/Nodes: Negative for adenopathy or mass

Lungs/Pleura: Interlobular septal thickening at the bases with trace
right pleural effusion. Negative for consolidation.

Musculoskeletal: No acute or aggressive finding

Review of the MIP images confirms the above findings.

CTA ABDOMEN AND PELVIS FINDINGS

VASCULAR

Aorta: Diffuse atheromatous wall thickening with extensive
calcification. No aneurysm or dissection.

Celiac: Atheromatous calcification at the ostium. No branch
occlusion or beading.

SMA: Atheromatous plaque at the ostium. No branch occlusion or
beading.

Renals: Atherosclerotic plaque at the ostia with mild narrowing on
the right. No beading or visible aneurysm.

IMA: Patent

Inflow: Diffuse atheromatous plaque. Negative for aneurysm or
dissection.

Veins: Negative in the arterial phase

Review of the MIP images confirms the above findings.

NON-VASCULAR

Hepatobiliary: Cirrhotic liver morphology. No hypervascular mass.No
evidence of biliary obstruction or stone.

Pancreas: Unremarkable.

Spleen: Unremarkable.

Adrenals/Urinary Tract: Negative adrenals. No hydronephrosis or
stone. Small interpolar left renal cystic density. Unremarkable
bladder.

Stomach/Bowel:  No obstruction. No evidence of bowel inflammation.

Lymphatic: No mass or adenopathy.

Reproductive:No pathologic findings.

Other: No ascites or pneumoperitoneum.

Musculoskeletal: No acute abnormalities.

Review of the MIP images confirms the above findings.
IMPRESSION: 1. No evidence of acute aortic syndrome.
2. Cardiomegaly and interstitial edema.
3. Cirrhosis.
4. Aortic Atherosclerosis ([id number]).

## 2021-07-03 IMAGING — CR DG RIBS W/ CHEST 3+V*R*
4 series · 4 of 4 positions shown · non-contrast
Comparison: Chest x-rays dated 06/21/2020 and 05/29/2020. Chest CT
dated 06/21/2020.

CLINICAL DATA: Rib pain, fall 1-2 weeks ago.

EXAM:
RIGHT RIBS AND CHEST - 3+ VIEW

[w chest pa]
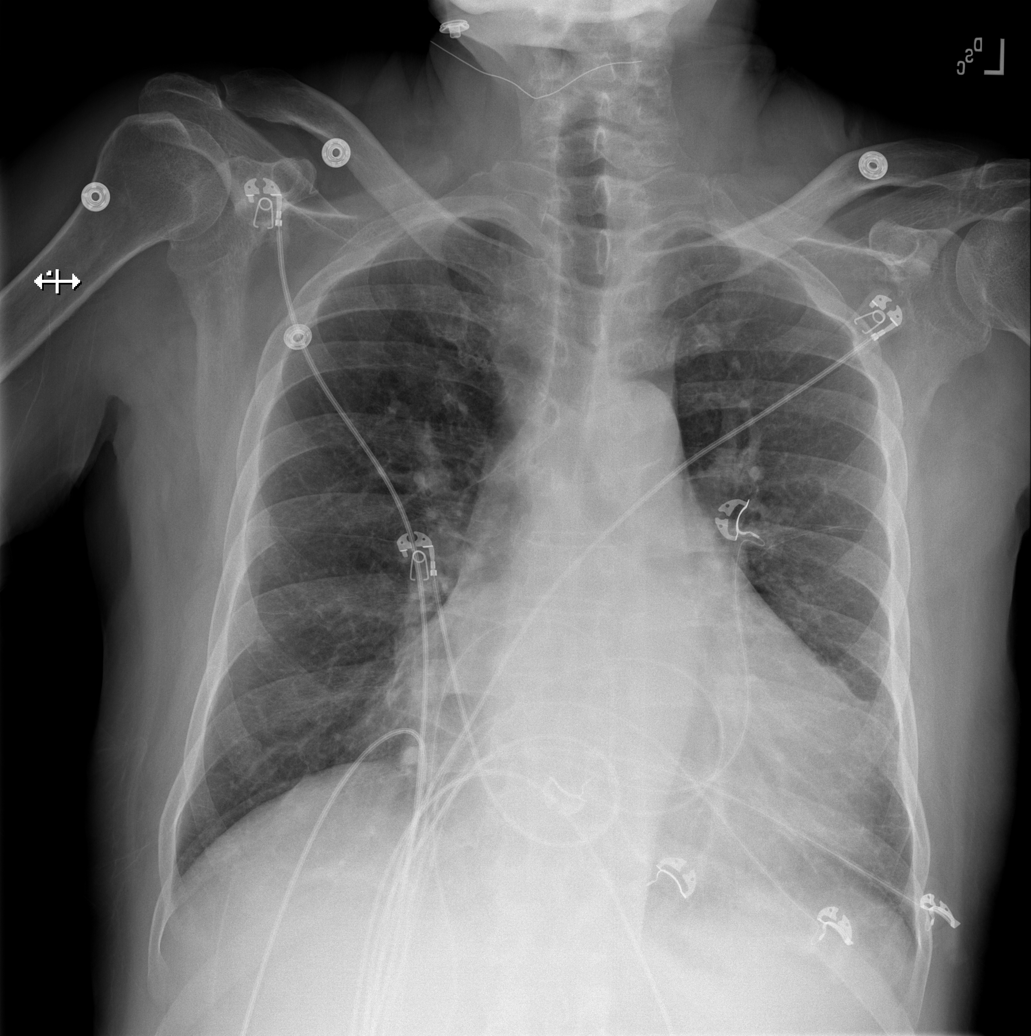

[w ribs ap upper right]
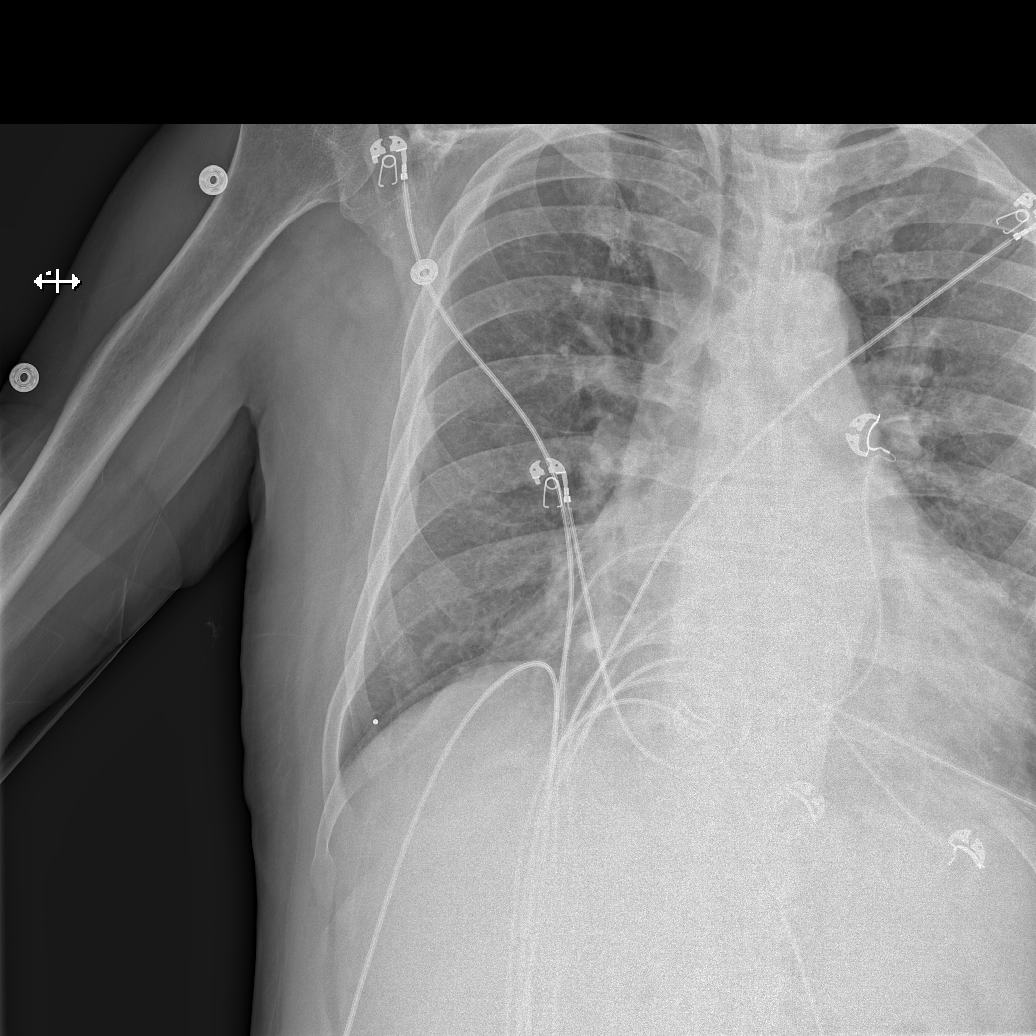

[w ribs ap lower right]
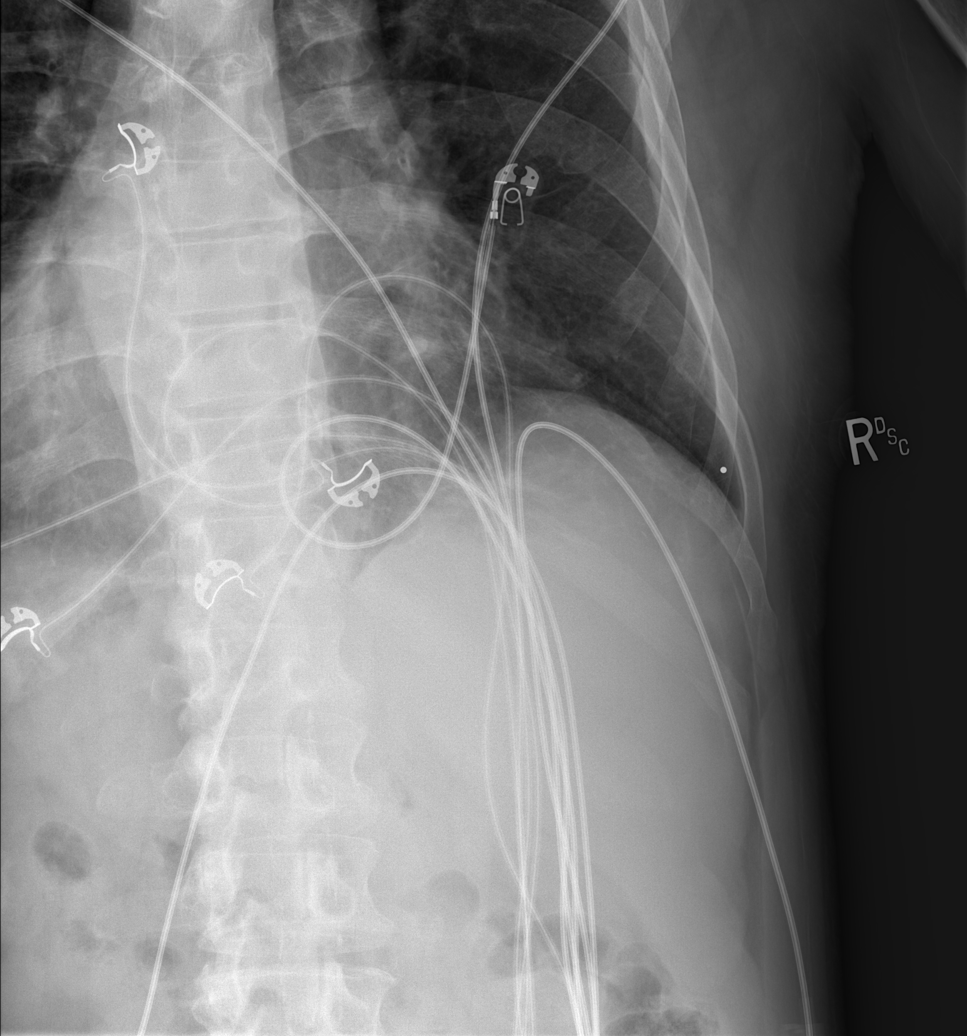

[w ribs obl right]
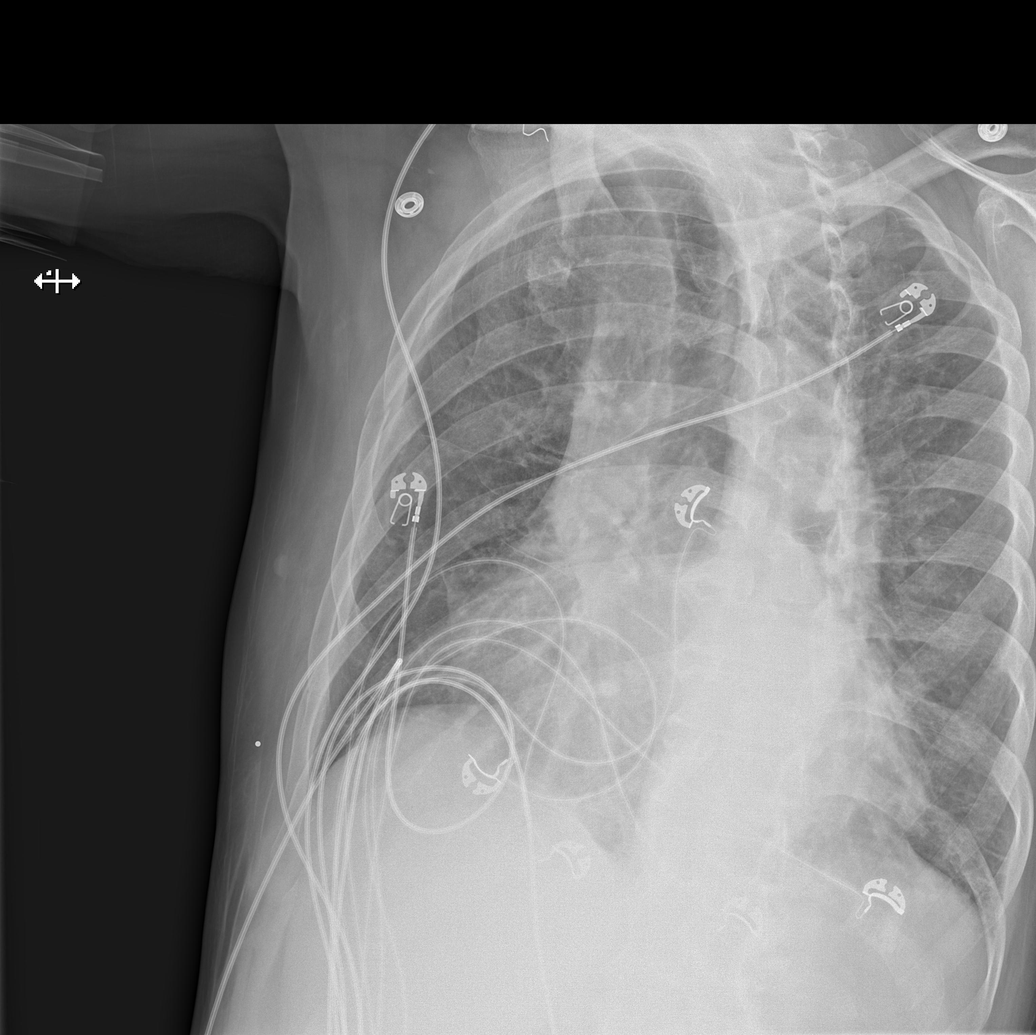

[4 of 4 positions shown; findings below may reference images not displayed]

FINDINGS: Stable cardiomegaly. Lungs are clear. No pleural effusion or
pneumothorax is seen. Nondisplaced healing fracture of the RIGHT
anterior sixth rib. Questionable acute slightly displaced fracture
of the RIGHT anterior-lateral fifth rib. Osseous structures about
the chest are otherwise unremarkable.
IMPRESSION: 1. Healing/subacute nondisplaced fracture of the RIGHT anterior
sixth rib. Questionable acute slightly displaced fracture of the
RIGHT anterior-lateral fifth rib.
2. Stable cardiomegaly. No evidence of pneumonia or pulmonary edema.
No pleural effusion or pneumothorax is seen.

## 2021-07-21 IMAGING — MR MR CERVICAL SPINE W/O CM
4 series · 48 of 48 positions shown · non-contrast
Comparison: Cervical spine CT 05/03/2020.

CLINICAL DATA: 58-year-old male status post fall 2 weeks ago with
acute or progressive myelopathy.

EXAM:
MRI CERVICAL SPINE WITHOUT CONTRAST
TECHNIQUE: Multiplanar, multisequence MR imaging of the cervical spine was
performed. No intravenous contrast was administered.

[Series 5: T1 · sagittal · 3.0mm · 0.62mm/px · 9 of 17 slices shown]
[im 1/17]
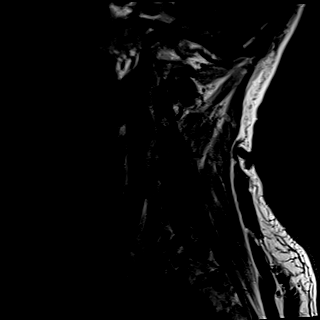
[im 3/17]
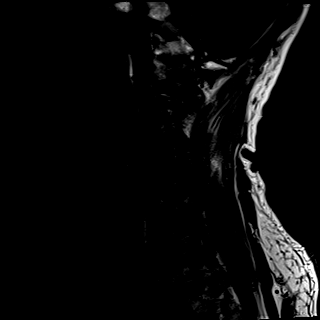
[im 5/17]
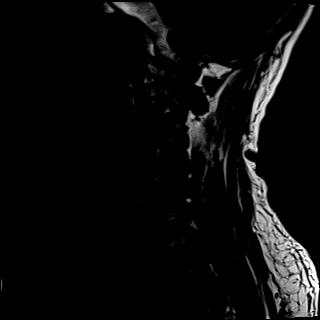
[im 7/17]
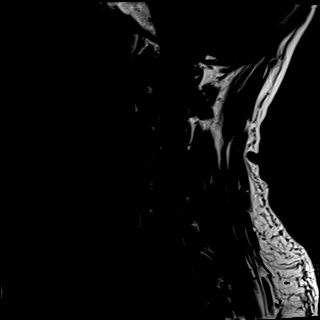
[im 9/17]
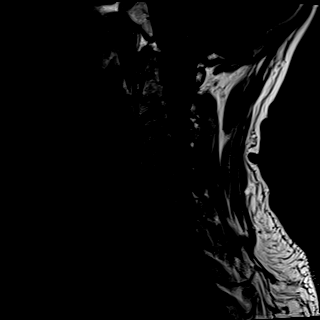
[im 11/17]
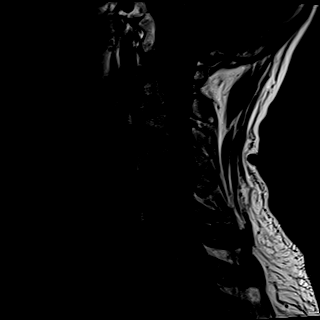
[im 13/17]
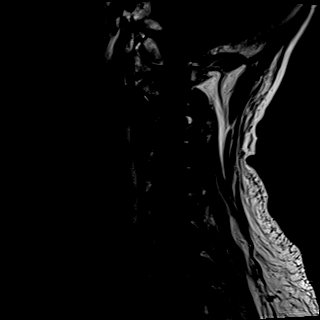
[im 15/17]
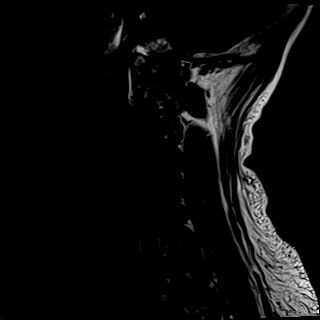
[im 17/17]
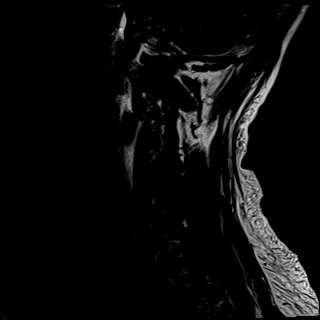

[Series 6: T2 · sagittal · 3.0mm · 0.69mm/px · 10 of 17 slices shown (1 of 2)]
[im 1/17]
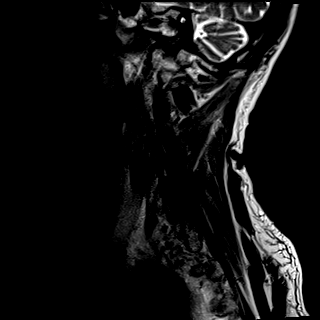
[im 2/17]
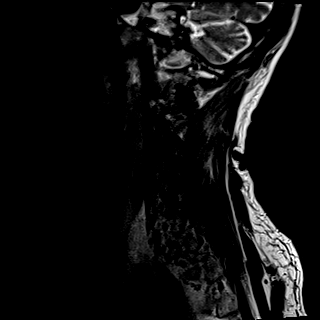
[im 4/17]
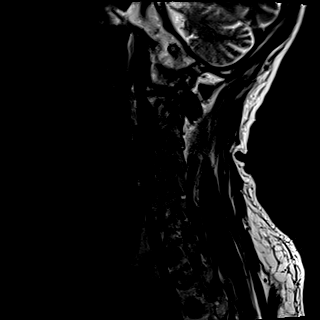
[im 6/17]
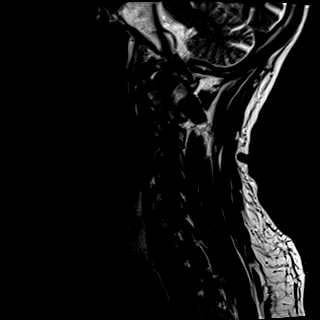
[im 8/17]
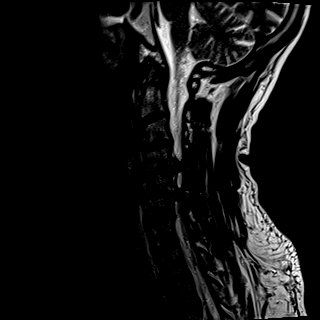
[im 9/17]
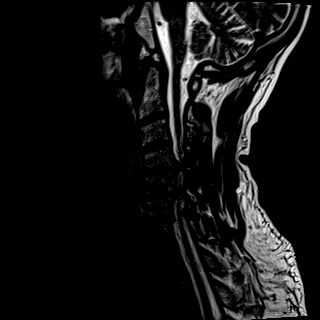
[im 11/17]
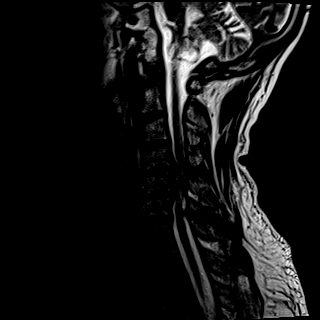
[im 13/17]
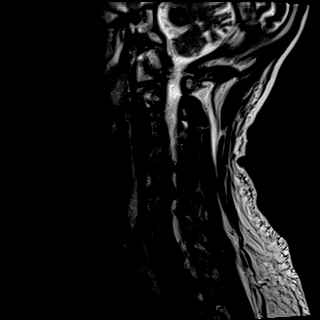
[im 15/17]
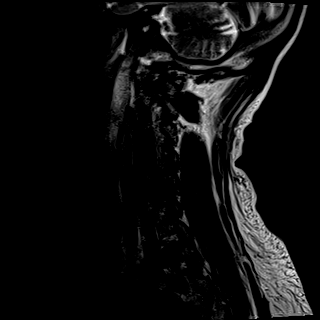
[im 17/17]
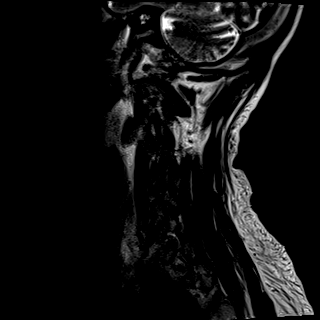

[Series 7: T2 · axial · 3.0mm · 0.70mm/px · z∈[-115,-1]mm · 19 of 34 slices shown (2 of 2)]
[im 1/34]
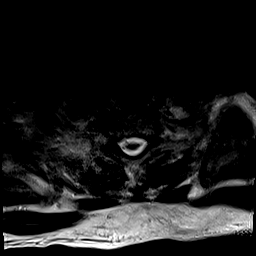
[im 2/34]
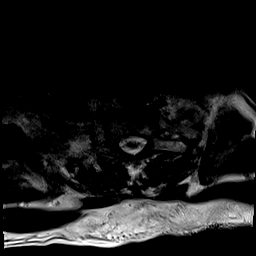
[im 4/34]
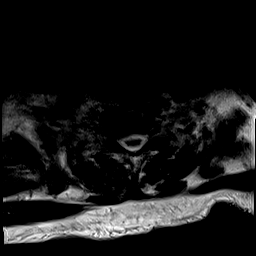
[im 6/34]
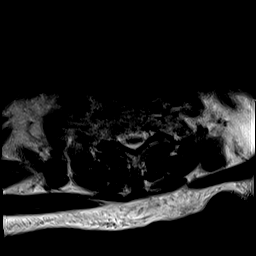
[im 8/34]
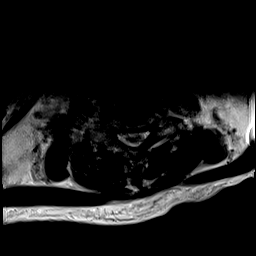
[im 10/34]
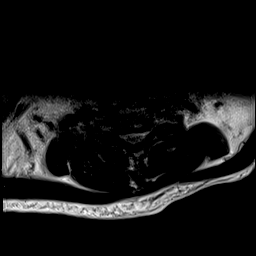
[im 12/34]
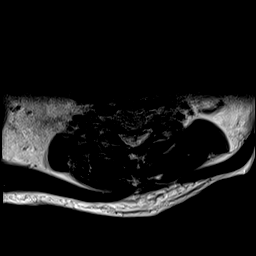
[im 13/34]
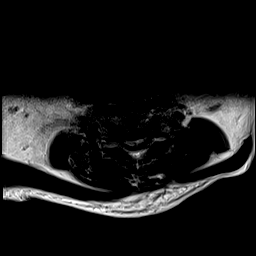
[im 15/34]
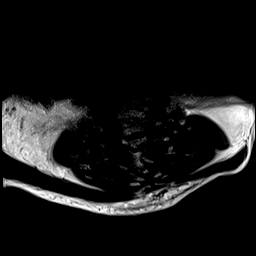
[im 17/34]
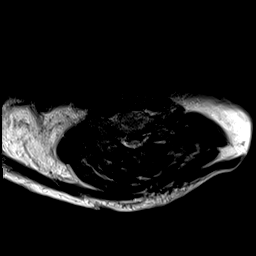
[im 19/34]
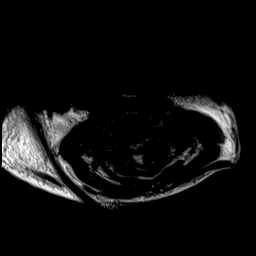
[im 21/34]
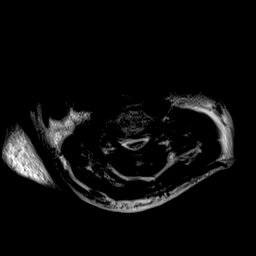
[im 23/34]
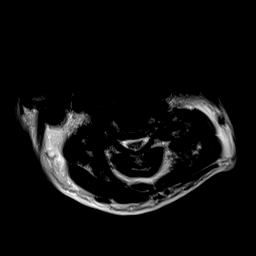
[im 24/34]
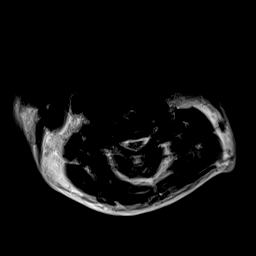
[im 26/34]
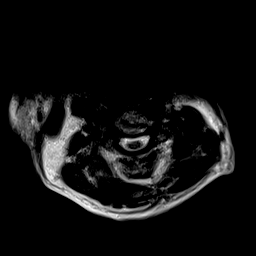
[im 28/34]
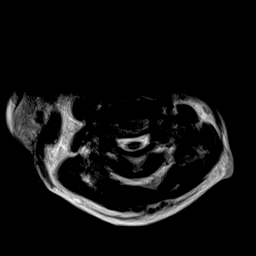
[im 30/34]
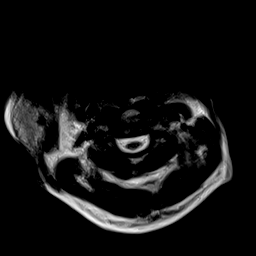
[im 32/34]
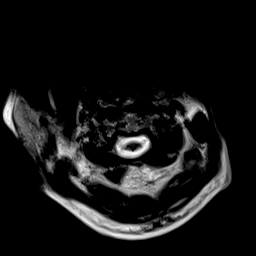
[im 34/34]
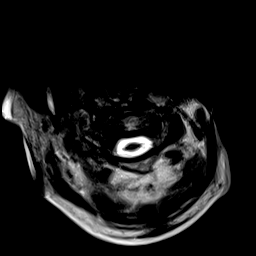

[Series 8: STIR · sagittal · 3.0mm · 0.86mm/px · 10 of 17 slices shown]
[im 1/17]
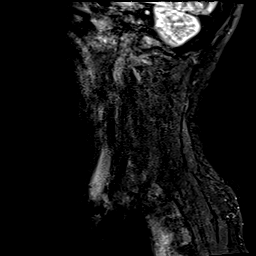
[im 2/17]
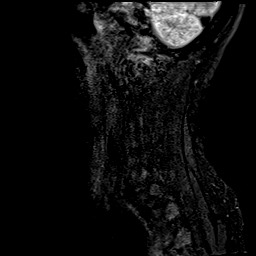
[im 4/17]
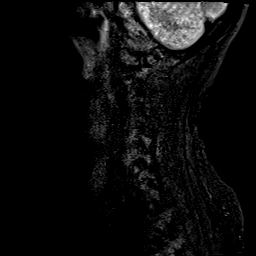
[im 6/17]
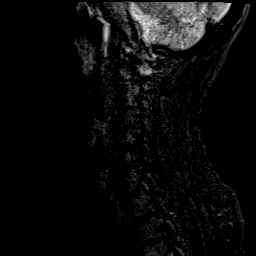
[im 8/17]
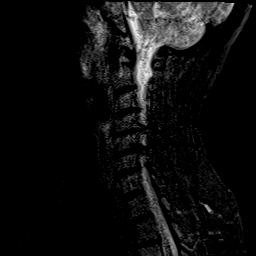
[im 9/17]
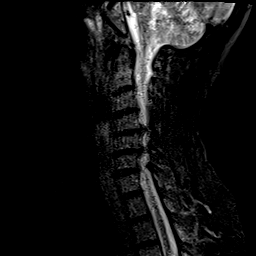
[im 11/17]
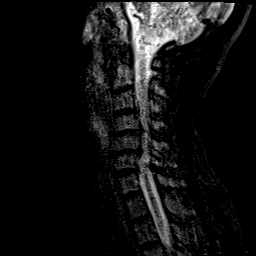
[im 13/17]
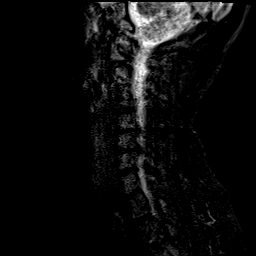
[im 15/17]
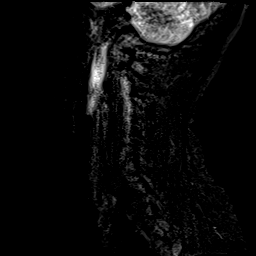
[im 17/17]
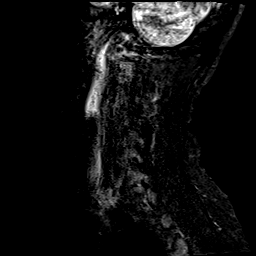

[48 of 48 positions shown; findings below may reference images not displayed]

FINDINGS: Study is mildly degraded by motion artifact despite repeated imaging
attempts, and no diagnostic axial GRE images could be obtained.

Alignment: Straightening of cervical lordosis is stable from [REDACTED]
CT. No significant spondylolisthesis.

Vertebrae: No marrow edema or evidence of acute osseous abnormality.
Visualized bone marrow signal is within normal limits.

Cord: Limited cervical spinal cord detail due to motion artifact,
but suggestion of cord T2 heterogeneity at both C4-C5 and C5-C6
where significant degenerative spinal stenosis and cord compression
are noted (see below) suspicious for myelomalacia. Above and below
those levels cord signal and morphology appears within normal
limits.

Posterior Fossa, vertebral arteries, paraspinal tissues:
Cervicomedullary junction appears within normal limits. There is
some inferior left cerebellar encephalomalacia suspected (series 6,
image 14).

Grossly negative visible neck soft tissues, lung apices.

Disc levels:

C2-C3: Disc bulging and endplate spurring is mild. No spinal
stenosis. There is mild C3 foraminal stenosis.

C3-C4: Mild disc bulging and endplate spurring. Right neural foramen
most affected. Moderate to severe right C4 foraminal stenosis. No
spinal stenosis.

C4-C5: Mild disc space loss but bulky broad-based posterior disc
osteophyte complex. This is mildly lobulated and results in moderate
to severe spinal stenosis with at least moderate cord mass effect
(series 7, image 17). Superimposed severe bilateral C5 foraminal
stenosis.

C5-C6: Disc space loss with similar bulky disc osteophyte complex,
broad-based mildly lobulated posterior component. Moderate to severe
spinal stenosis and at least moderate cord mass effect (series 7,
image 21). Severe bilateral C6 foraminal stenosis.

C6-C7: Mild circumferential disc bulge and endplate spurring. Mild
to moderate posterior element hypertrophy. Borderline to mild spinal
stenosis. No cord mass effect. Mild left and moderate right C7
foraminal stenosis.

C7-T1:  Mild facet hypertrophy.  No significant stenosis.

Negative visible upper thoracic levels.
IMPRESSION: 1. Bulky disc osteophyte degeneration at both C4-C5 and C5-C6 with
moderate to severe spinal stenosis at both levels, at least moderate
cord mass effect.
Suspicion of abnormal cord signal (myelomalacia versus cord edema
from recent fall) although suboptimal cord detail due to motion
artifacts.

2. No other significant spinal stenosis despite additional
degeneration. There is moderate or severe degenerative neural
foraminal stenosis at the right C4, bilateral C5 and C6, and right
C7 nerve levels.

Salient findings on this exam were reviewed in person with Dr.
Ianko Che on 07/18/2020 at [DATE] .

## 2021-07-27 IMAGING — RF DG CERVICAL SPINE 2 OR 3 VIEWS
1 series · 2 of 2 positions shown · non-contrast
Comparison: Cervical spine MRI 07/18/2020

CLINICAL DATA: Provided history: Surgery, elective. C4-6 ACDF.
Fluoroscopy time 8 seconds, 0.35 mGy.

EXAM:
CERVICAL SPINE - 2-3 VIEW; DG C-ARM 1-60 MIN

[Series 1: run · 2 of 2 slices shown]
[im 1/2]
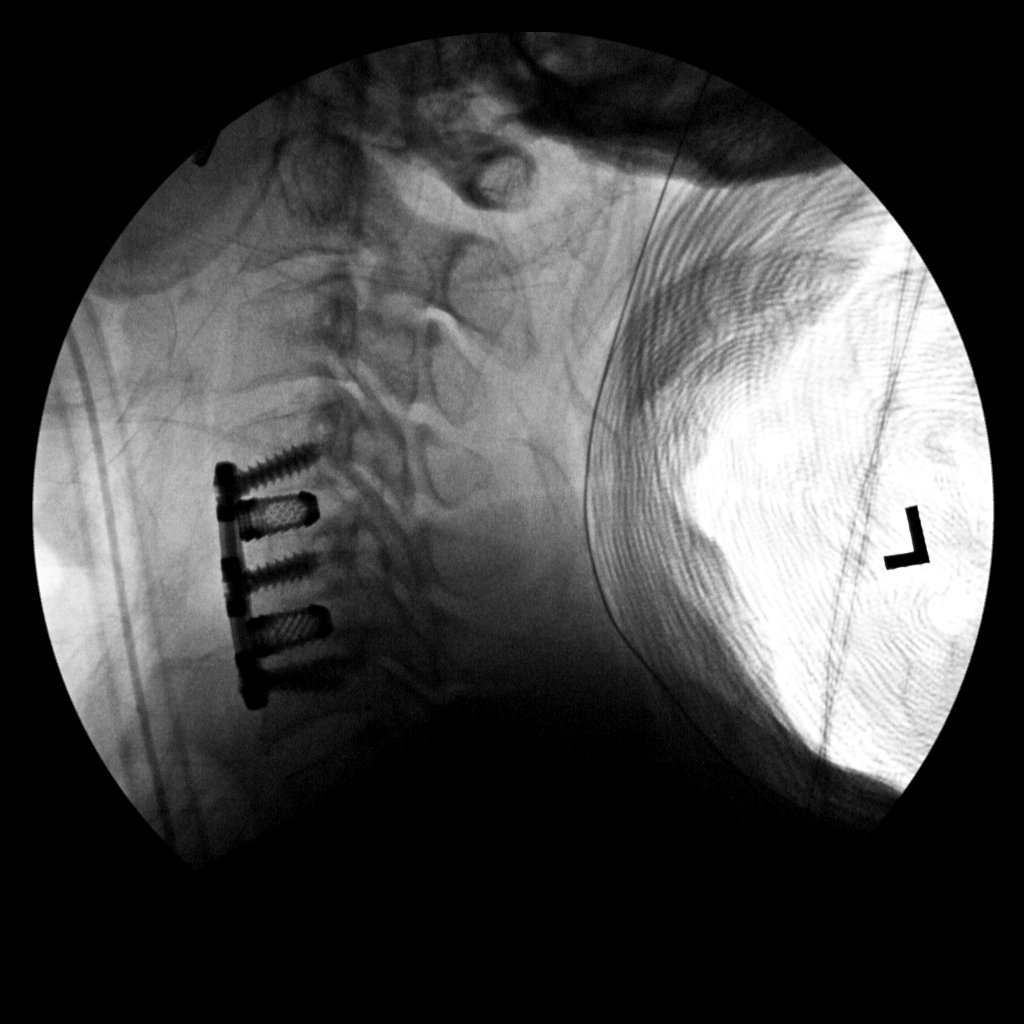
[im 2/2]
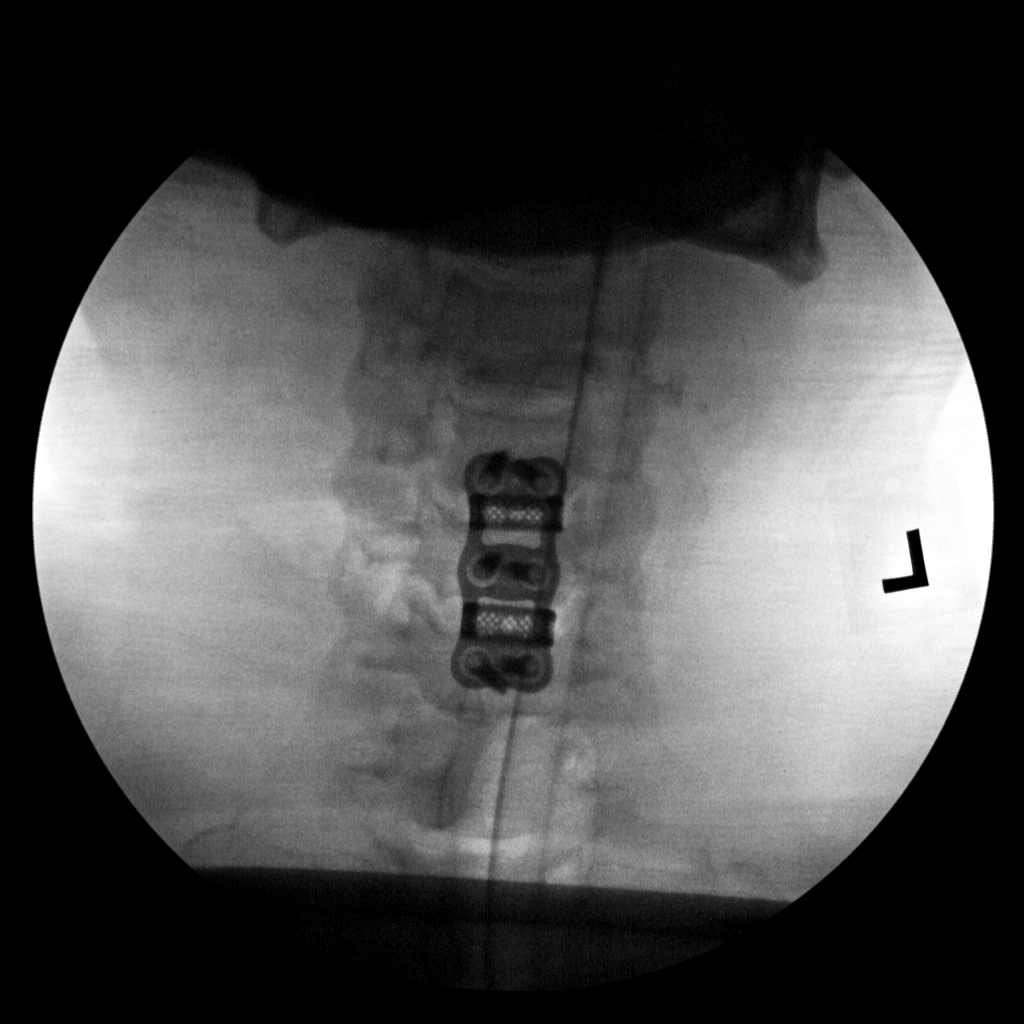

[2 of 2 positions shown; findings below may reference images not displayed]

FINDINGS: PA and lateral view fluoroscopic images of the cervical spine are
submitted, two images total. Sequela of interval C4-C6 ACDF. No
unexpected finding. Partially visualized support tubes.
IMPRESSION: Two intraoperative fluoroscopic images of the cervical spine
demonstrating interval C4-C6 ACDF. No unexpected finding.

## 2021-07-27 IMAGING — RF DG C-ARM 1-60 MIN
1 series · 2 of 2 positions shown · non-contrast
Comparison: Cervical spine MRI 07/18/2020

CLINICAL DATA: Provided history: Surgery, elective. C4-6 ACDF.
Fluoroscopy time 8 seconds, 0.35 mGy.

EXAM:
CERVICAL SPINE - 2-3 VIEW; DG C-ARM 1-60 MIN

[Series 1: run · 2 of 2 slices shown]
[im 1/2]
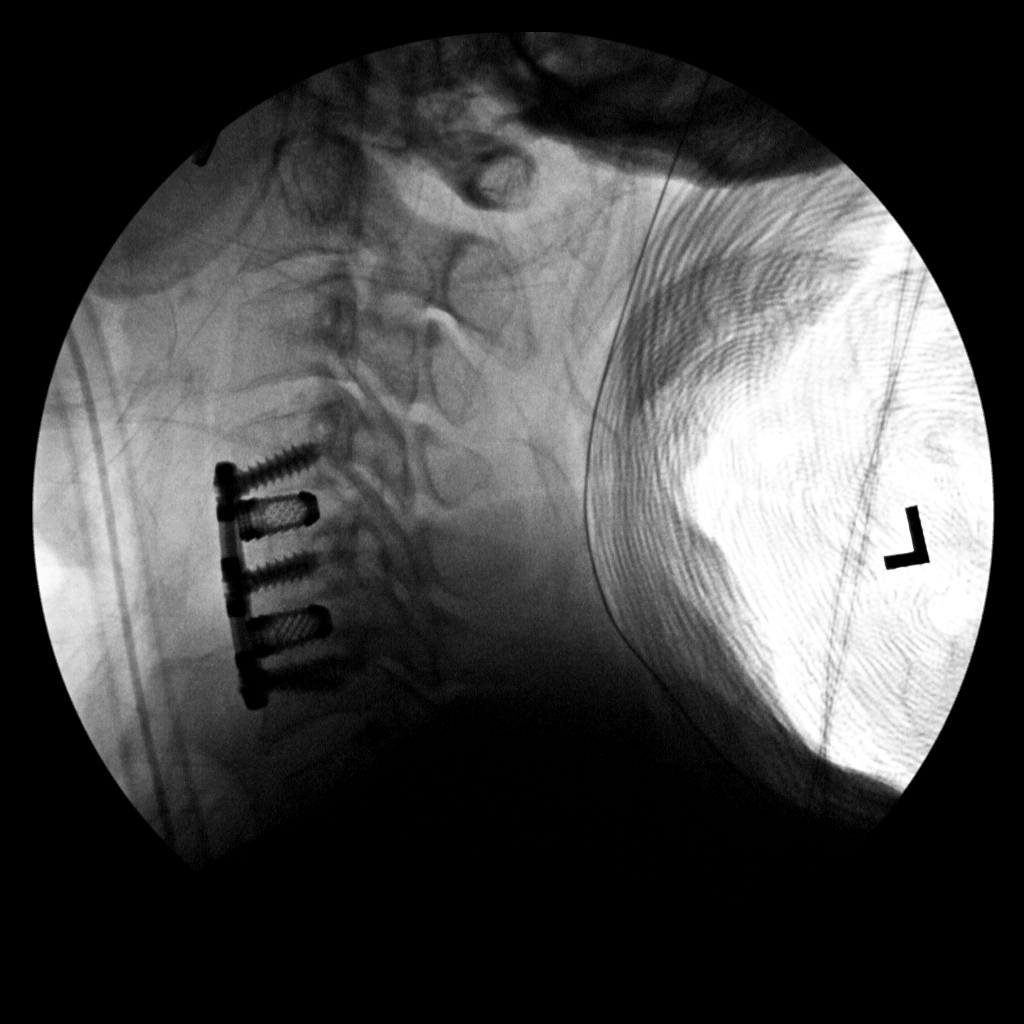
[im 2/2]
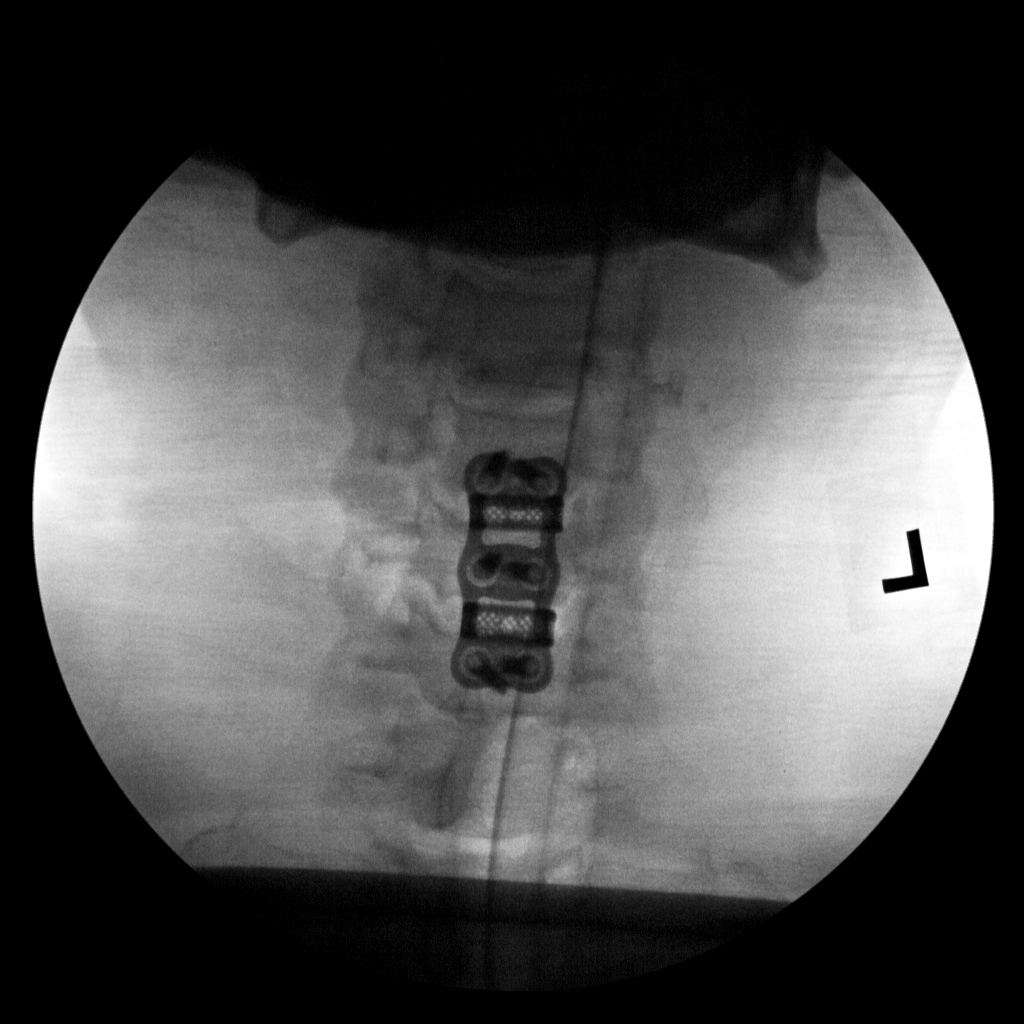

[2 of 2 positions shown; findings below may reference images not displayed]

FINDINGS: PA and lateral view fluoroscopic images of the cervical spine are
submitted, two images total. Sequela of interval C4-C6 ACDF. No
unexpected finding. Partially visualized support tubes.
IMPRESSION: Two intraoperative fluoroscopic images of the cervical spine
demonstrating interval C4-C6 ACDF. No unexpected finding.

## 2021-08-02 IMAGING — CR DG CERVICAL SPINE COMPLETE 4+V
5 series · 5 of 5 positions shown · non-contrast
Comparison: Postoperative films 07/24/2020

CLINICAL DATA: Right-sided neck pain and right arm pain. History of
recent cervical fusion procedure.

EXAM:
CERVICAL SPINE - COMPLETE 4+ VIEW

[c-spine obl (1 of 2)]
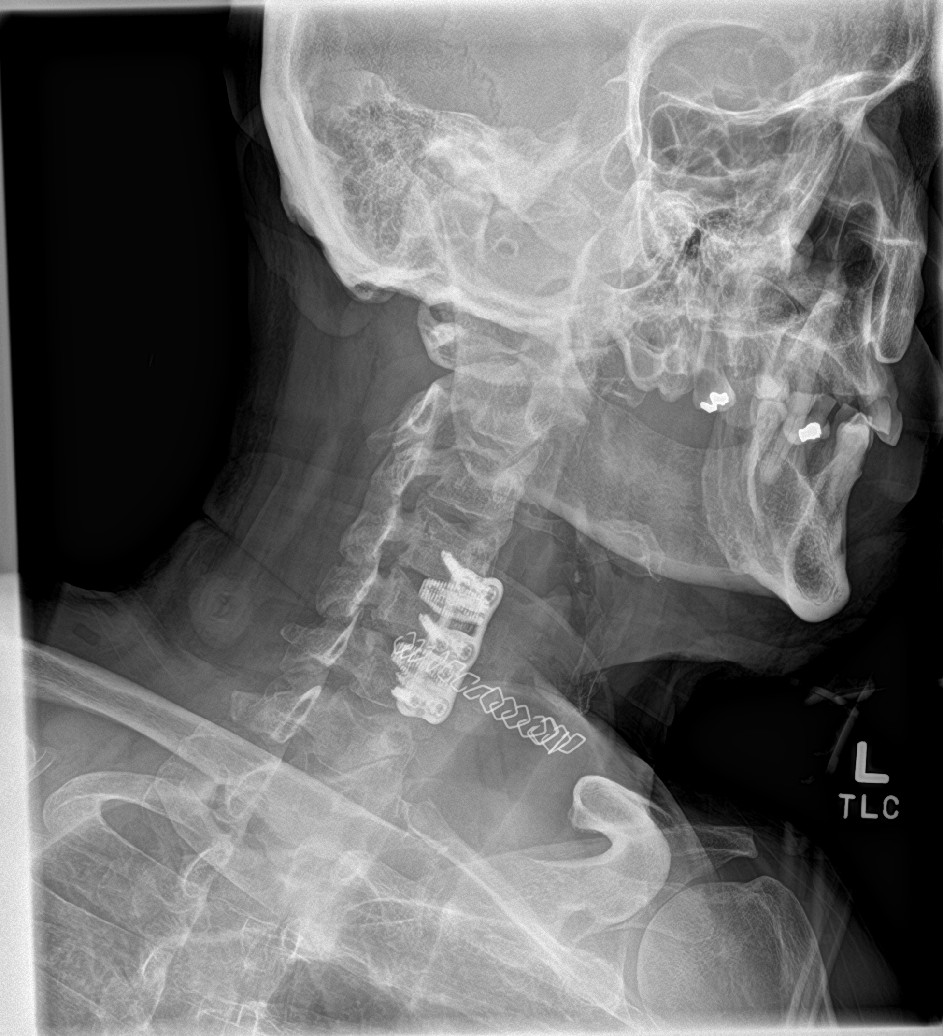

[c-spine obl (2 of 2)]
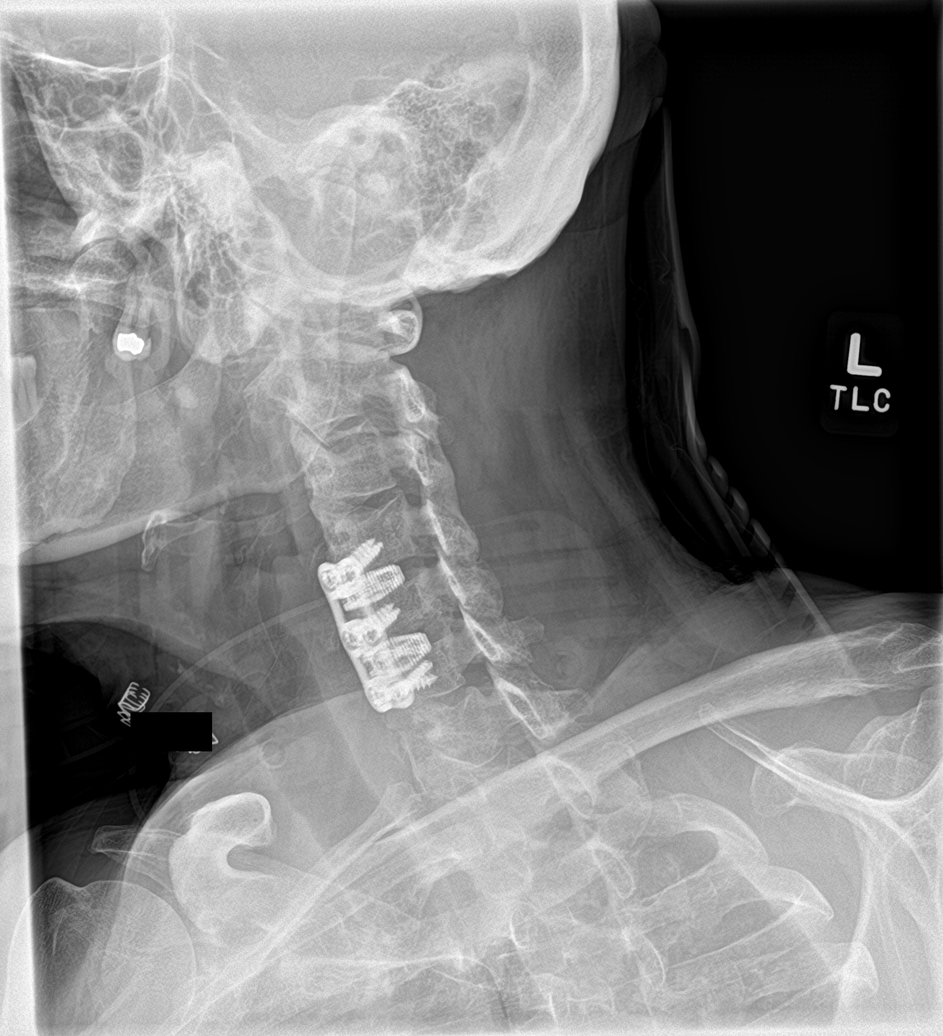

[c-spine ap]
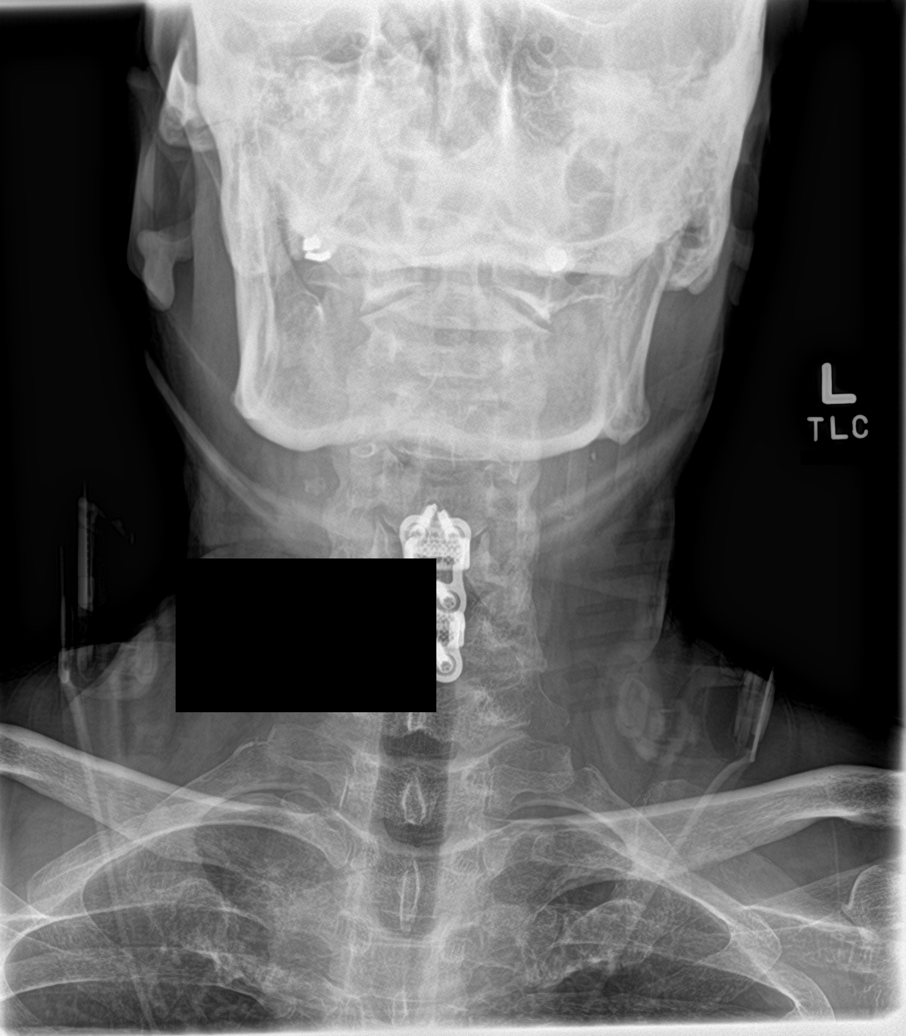

[c-spine open mouth]
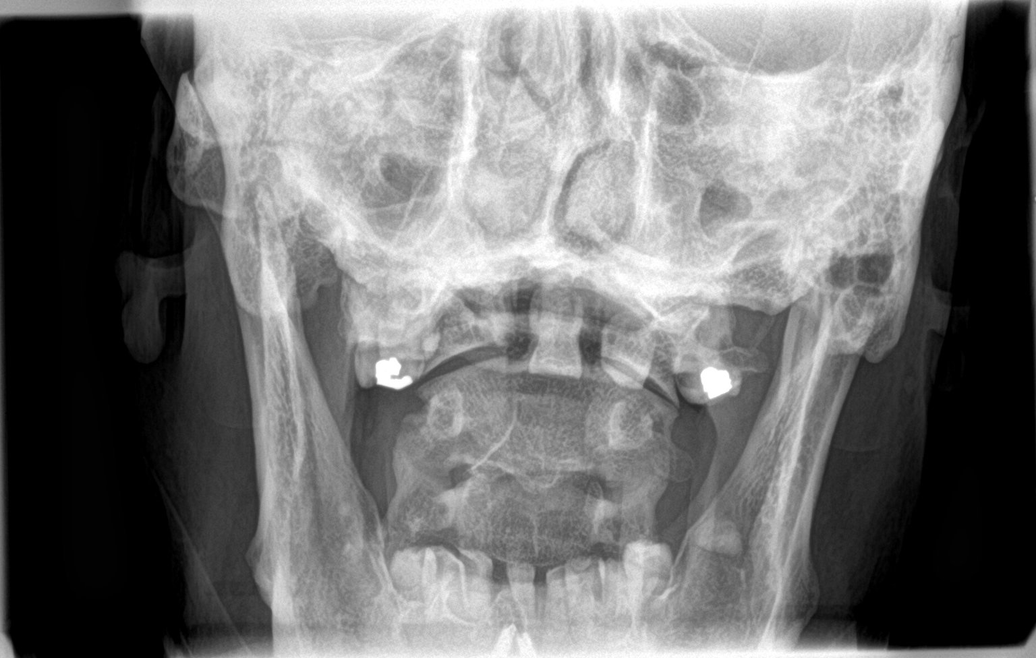

[c-spine lat]
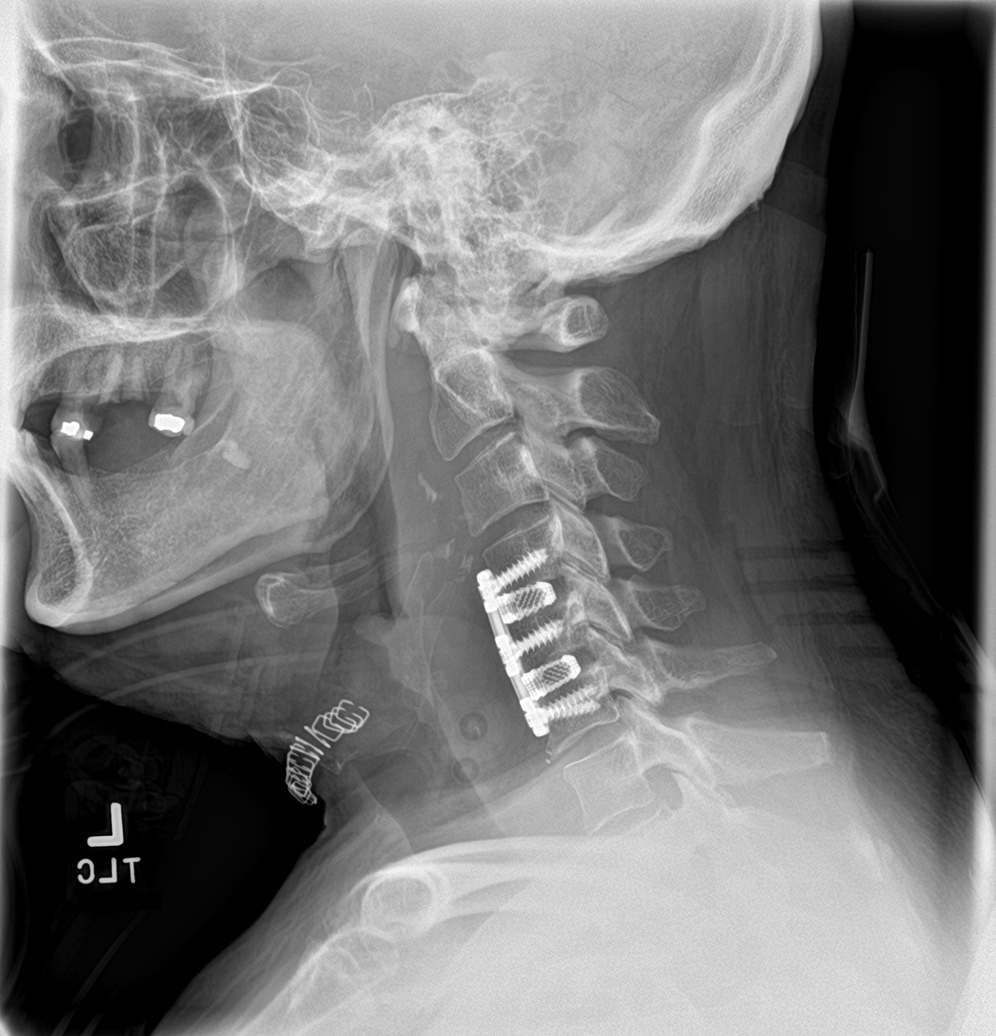

[5 of 5 positions shown; findings below may reference images not displayed]

FINDINGS: Moderate prevertebral soft tissue swelling, likely postoperative
hematoma.

Anterior and interbody fusion hardware at C4-5 and C5-6 is in good
position, unchanged. No complicating features. Normal alignment. No
acute bony findings. The facets are normally aligned. The bony
neural foramen are fairly well maintained. Mild uniform narrowing at
C5-6 is noted bilaterally.

The lung apices are grossly clear.
IMPRESSION: 1. Anterior and interbody fusion hardware at C4-5 and C5-6 without
complicating features.
2. Moderate prevertebral soft tissue swelling, likely postoperative
hematoma.
3. No acute bony findings.

## 2021-08-02 IMAGING — CT CT NECK W/O CM
4 of 5 series · 15 of 33 positions shown, 17 images · IV contrast (APPLIED)
Comparison: None.

CLINICAL DATA: Neck mass; additional history right neck pain,
recent spinal surgery

EXAM:
CT NECK WITHOUT CONTRAST
TECHNIQUE: Multidetector CT imaging of the neck was performed following the
standard protocol without intravenous contrast.

[Series 3: neck 2.0 i31s 3 · axial · 0.55mm/px · z∈[-234,-90]mm · 4 of 122 slices shown, 5 images]
[im 25/122  soft-tissue]
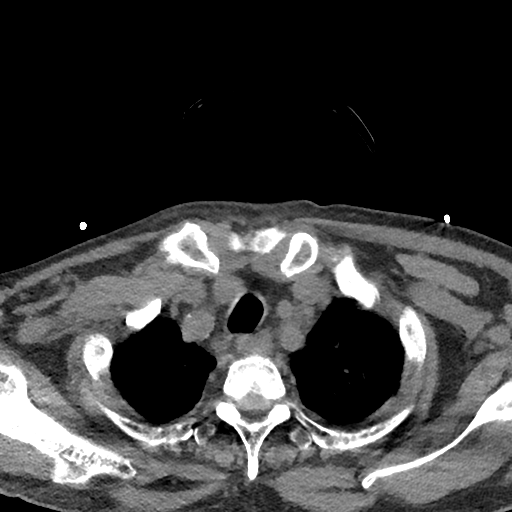
[im 25/122  bone]
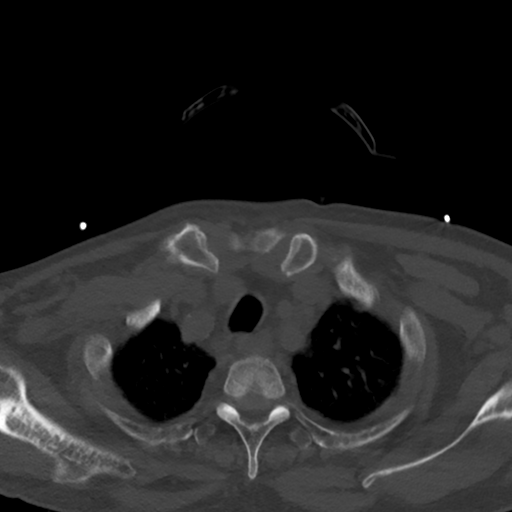
[im 49/122  bone]
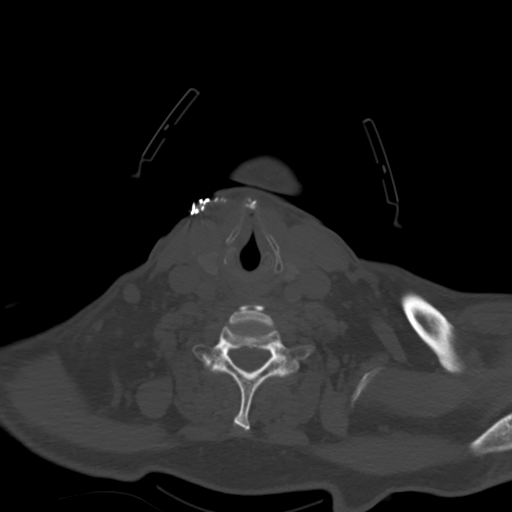
[im 73/122  bone]
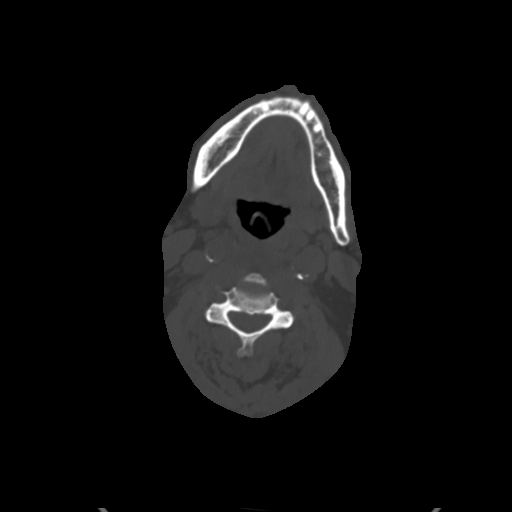
[im 97/122  bone]
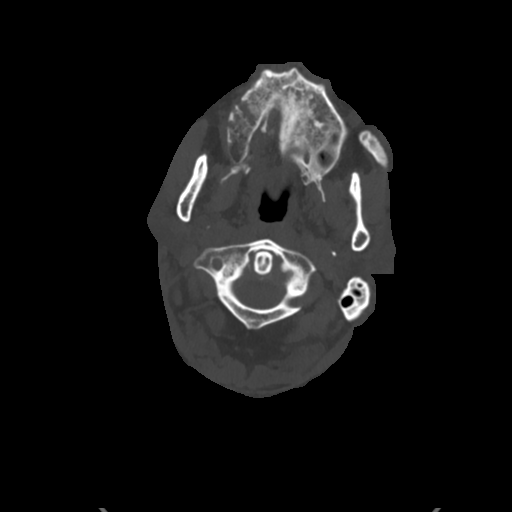

[Series 6: coronal st · coronal · 0.52mm/px · 3 of 110 slices shown]
[im 22/110  bone]
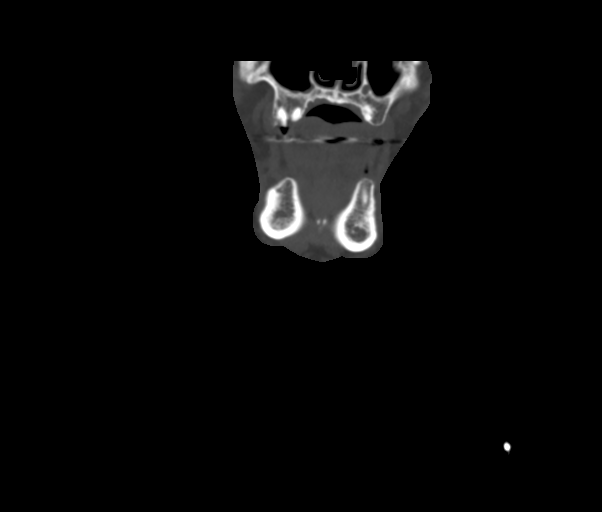
[im 44/110  bone]
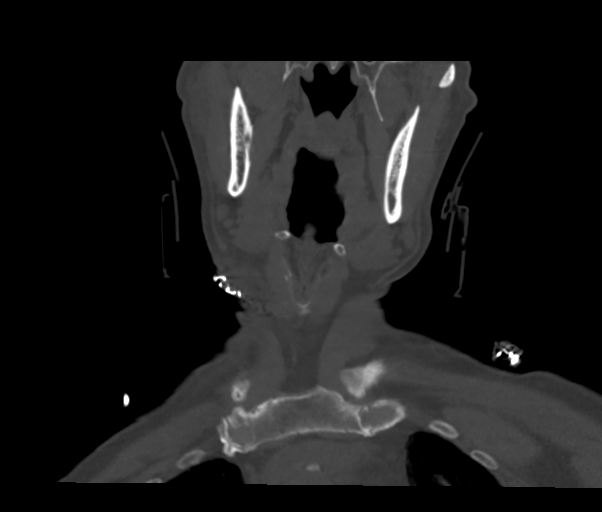
[im 66/110  bone]
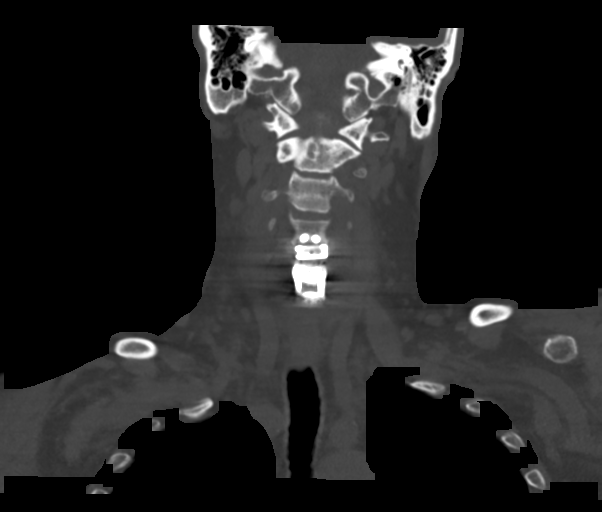

[Series 7: sagittal st · sagittal · 0.52mm/px · 5 of 75 slices shown, 6 images]
[im 25/75  bone]
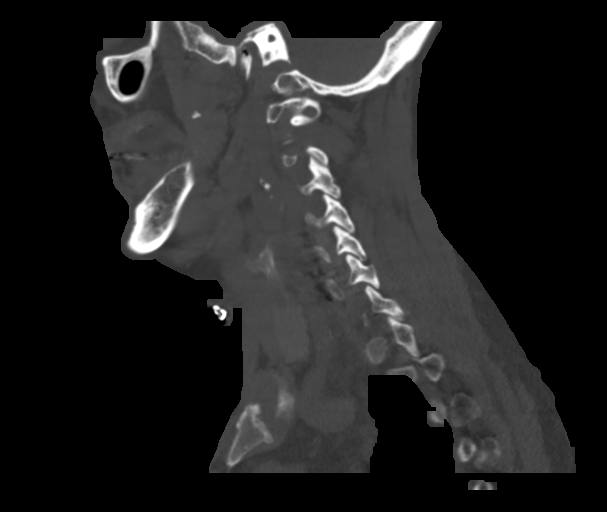
[im 31/75  bone]
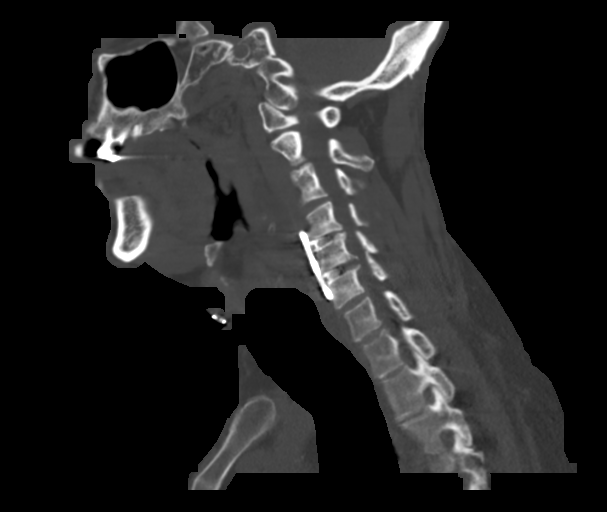
[im 38/75  soft-tissue]
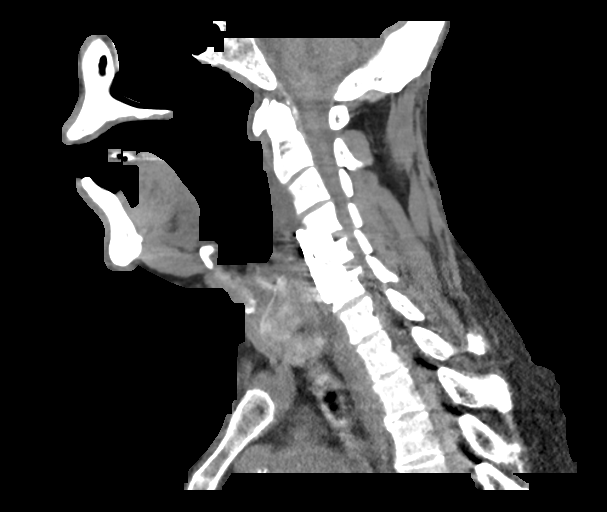
[im 38/75  bone]
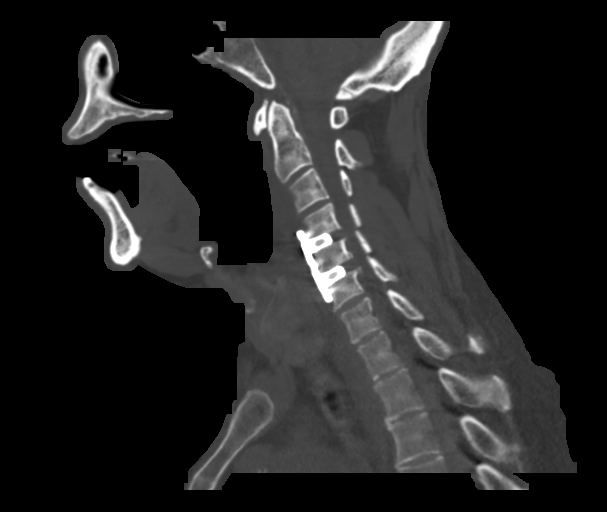
[im 44/75  bone]
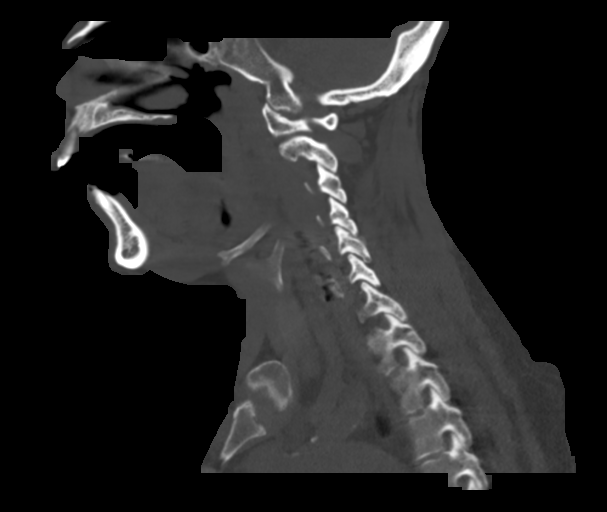
[im 50/75  bone]
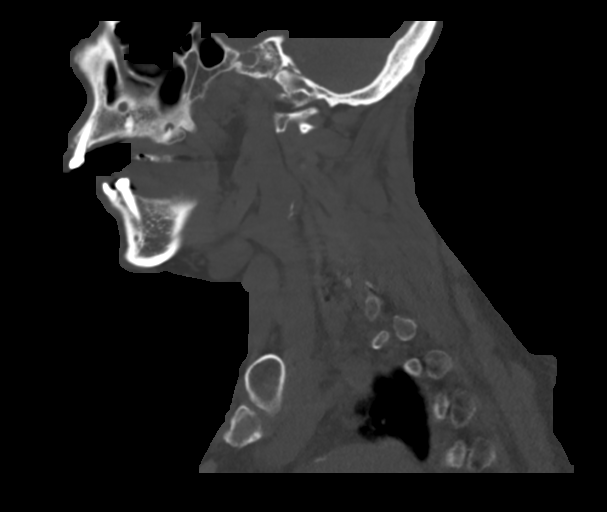

[Series 8: orthogonal st · axial · 0.49mm/px · z∈[-261,-158]mm · 3 of 132 slices shown]
[im 27/132  bone]
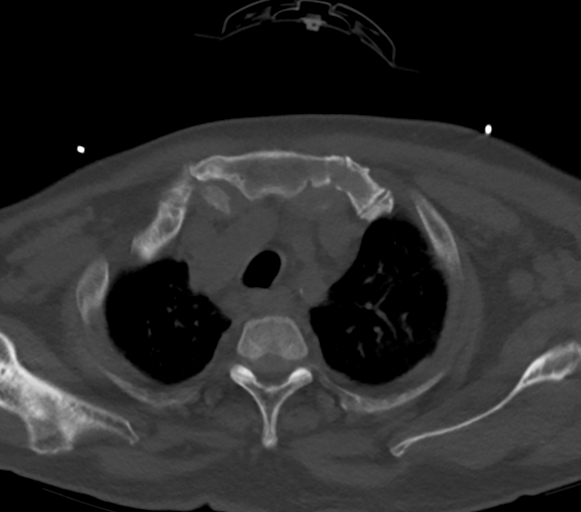
[im 53/132  bone]
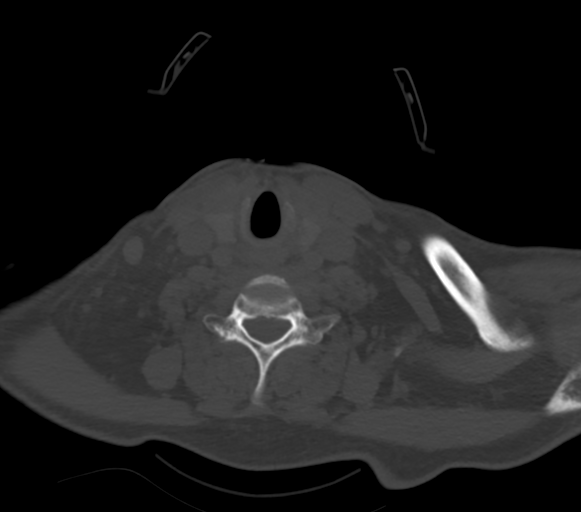
[im 79/132  bone]
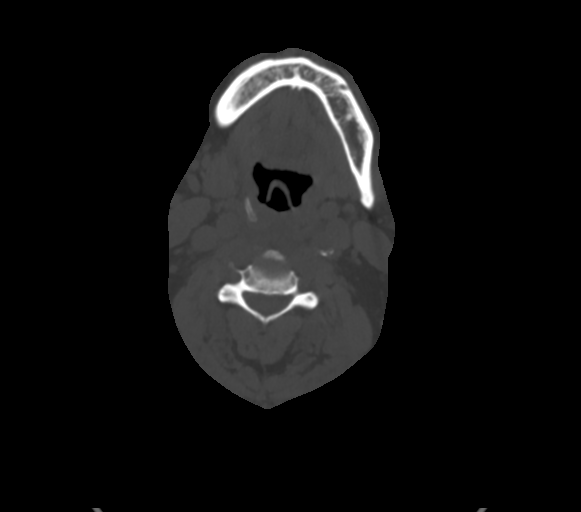

[15 of 33 positions shown; findings below may reference images not displayed]

FINDINGS: Pharynx and larynx: There is prevertebral/retropharyngeal soft
tissue swelling and edema. Pharynx is unremarkable. Larynx is
unremarkable. Airway is patent.

Salivary glands: Unremarkable.

Thyroid: Normal

Lymph nodes: No enlarged lymph nodes identified.

Vascular: Calcified plaque at the common carotid bifurcations.

Limited intracranial: Unremarkable.

Visualized orbits: Unremarkable.

Mastoids and visualized paranasal sinuses: Minor mucosal thickening.
Visualized mastoids are clear.

Skeleton: Postoperative changes of anterior fusion at C4-C6 with
plate and screw fixation and interbody allografts.

Upper chest: Partially imaged bilateral pleural effusions. Patchy
ground-glass density in the left upper lobe.

Other: Postoperative changes with some residual subcutaneous edema
and air. No collection.
IMPRESSION: Persistent postoperative prevertebral/retropharyngeal soft tissue
swelling and edema. Postoperative changes in the right neck without
fluid collection.

Partially imaged bilateral pleural effusions. Patchy ground-glass
opacities in the left upper lobe may reflect edema or
infectious/inflammatory process.

## 2021-08-03 IMAGING — CR DG CHEST 2V
2 series · 2 of 2 positions shown · non-contrast
Comparison: Chest x-ray 06/30/2020

CLINICAL DATA: Chest pain. Patient was seen in the emergency
department yesterday for neck pain and release.

EXAM:
CHEST - 2 VIEW

[chest lat]
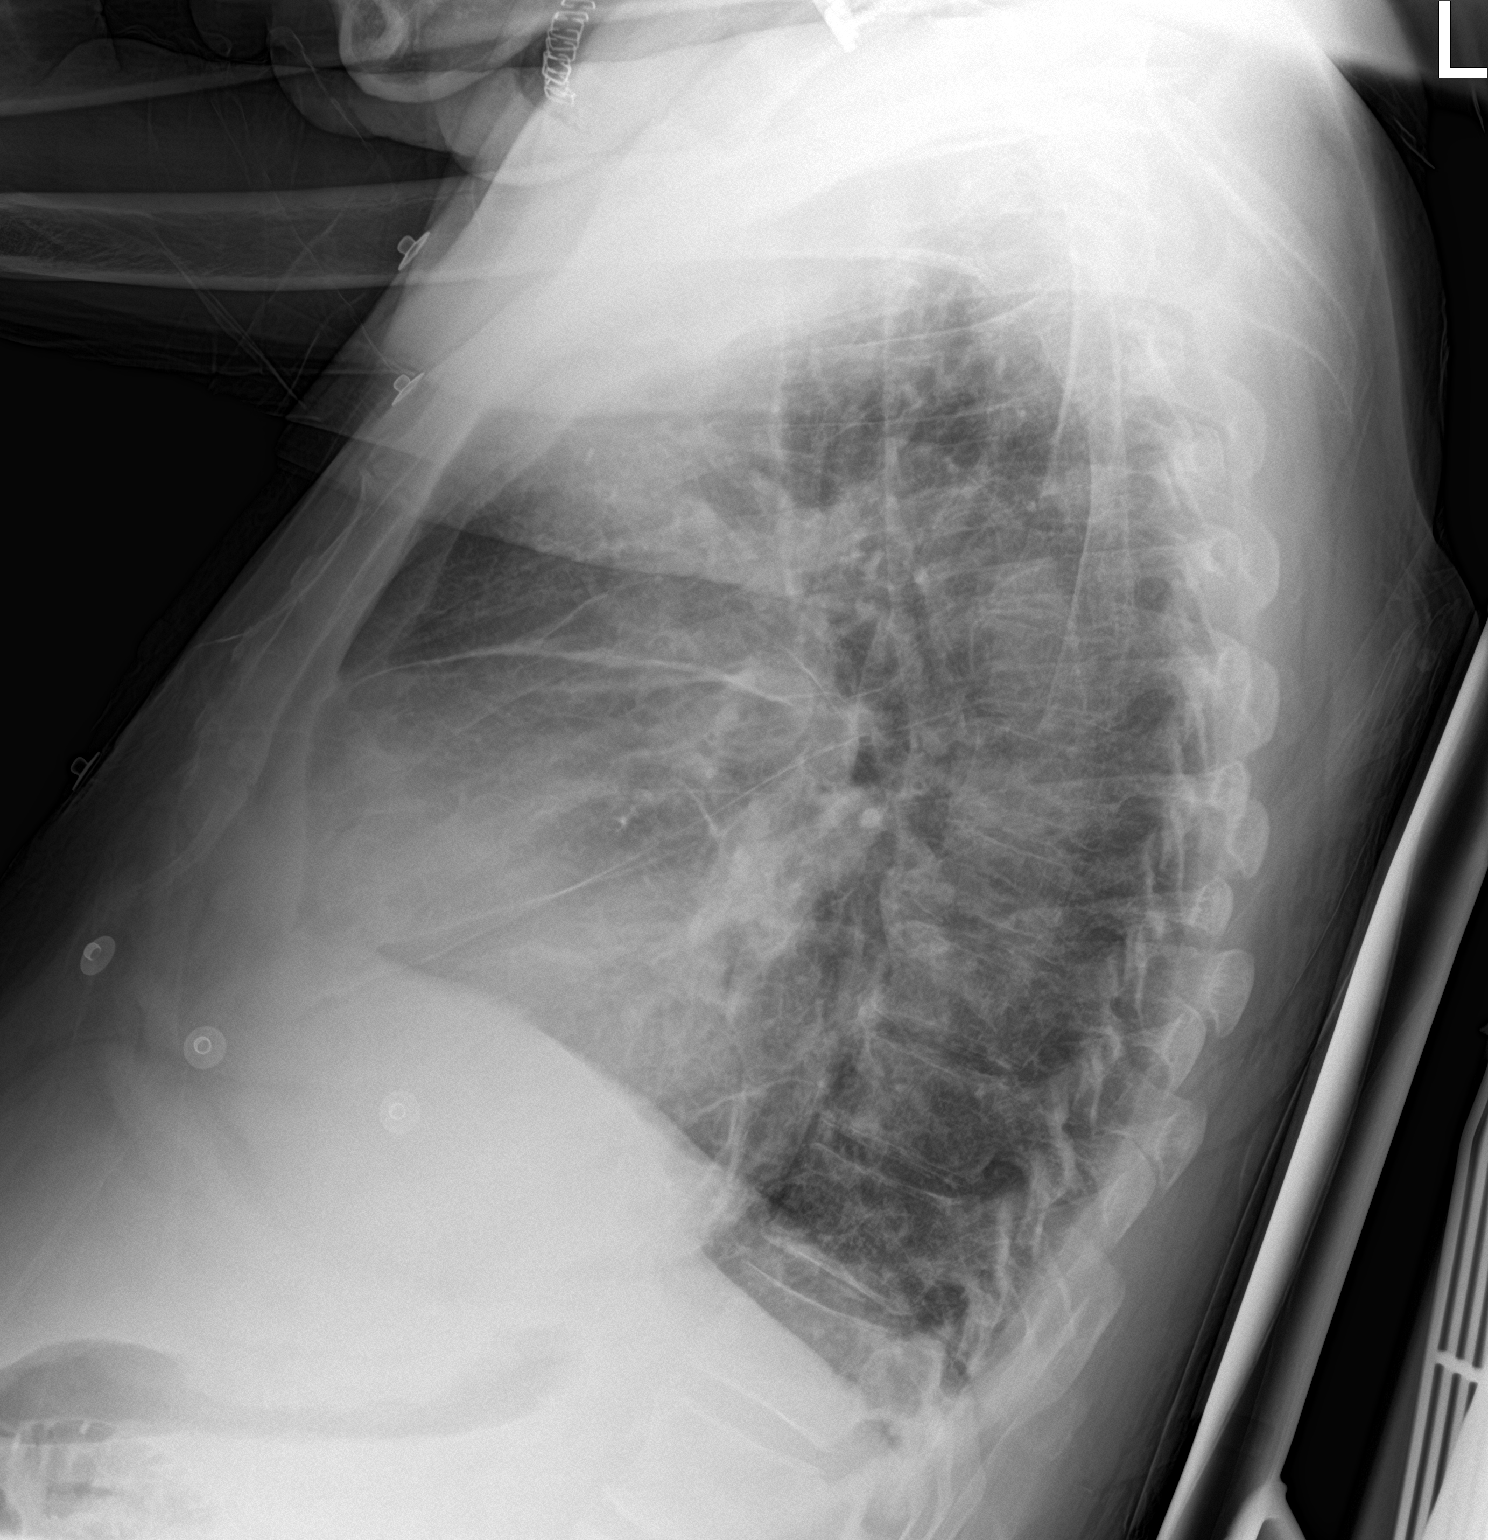

[chest ap]
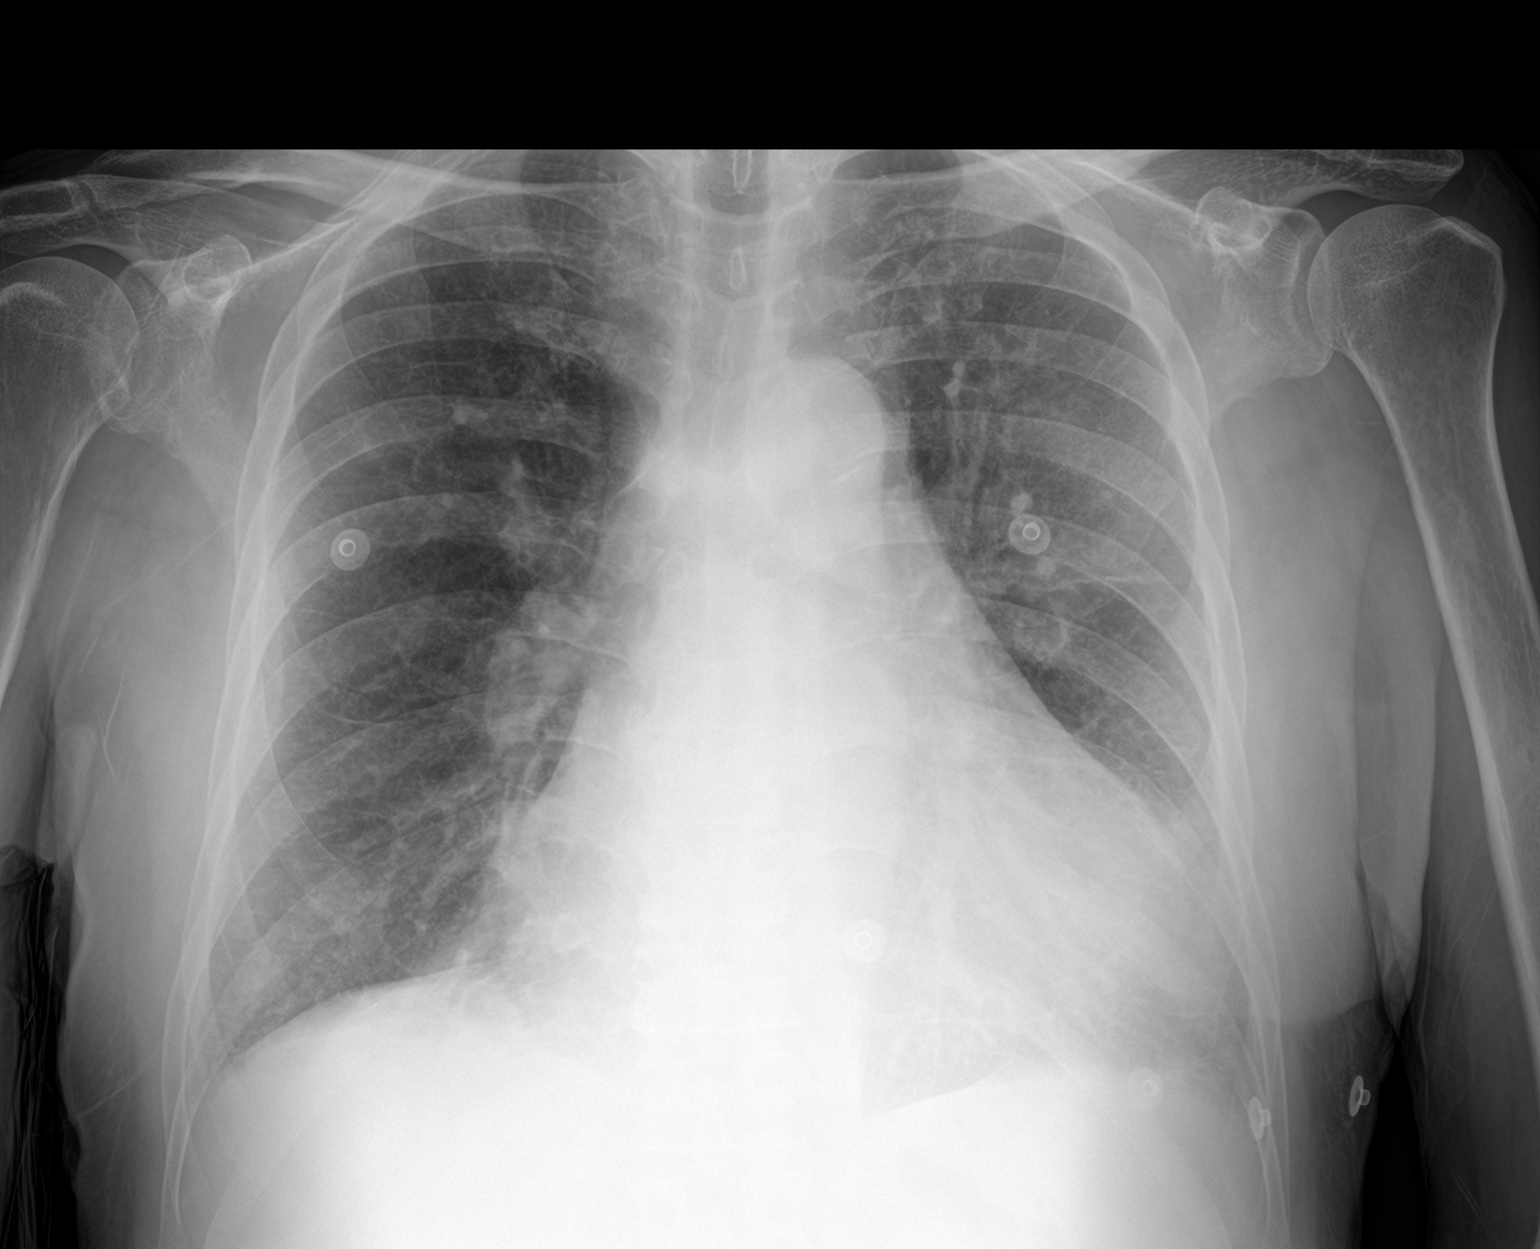

[2 of 2 positions shown; findings below may reference images not displayed]

FINDINGS: Heart is enlarged. Mild pulmonary vascular congestion is present.
Mild bibasilar opacities are present bilaterally without significant
consolidation otherwise. Surgical clips are present in the right
neck.
IMPRESSION: 1. Cardiomegaly and mild pulmonary vascular congestion.
2. Mild bibasilar airspace disease likely reflects atelectasis.

## 2021-08-19 IMAGING — CR DG FINGER THUMB 2+V*R*
3 series · 3 of 3 positions shown · non-contrast
Comparison: None

CLINICAL DATA: Injury 2 thumb, status post fall 2 days ago.

EXAM:
RIGHT THUMB 2+V

[x finger pa right]
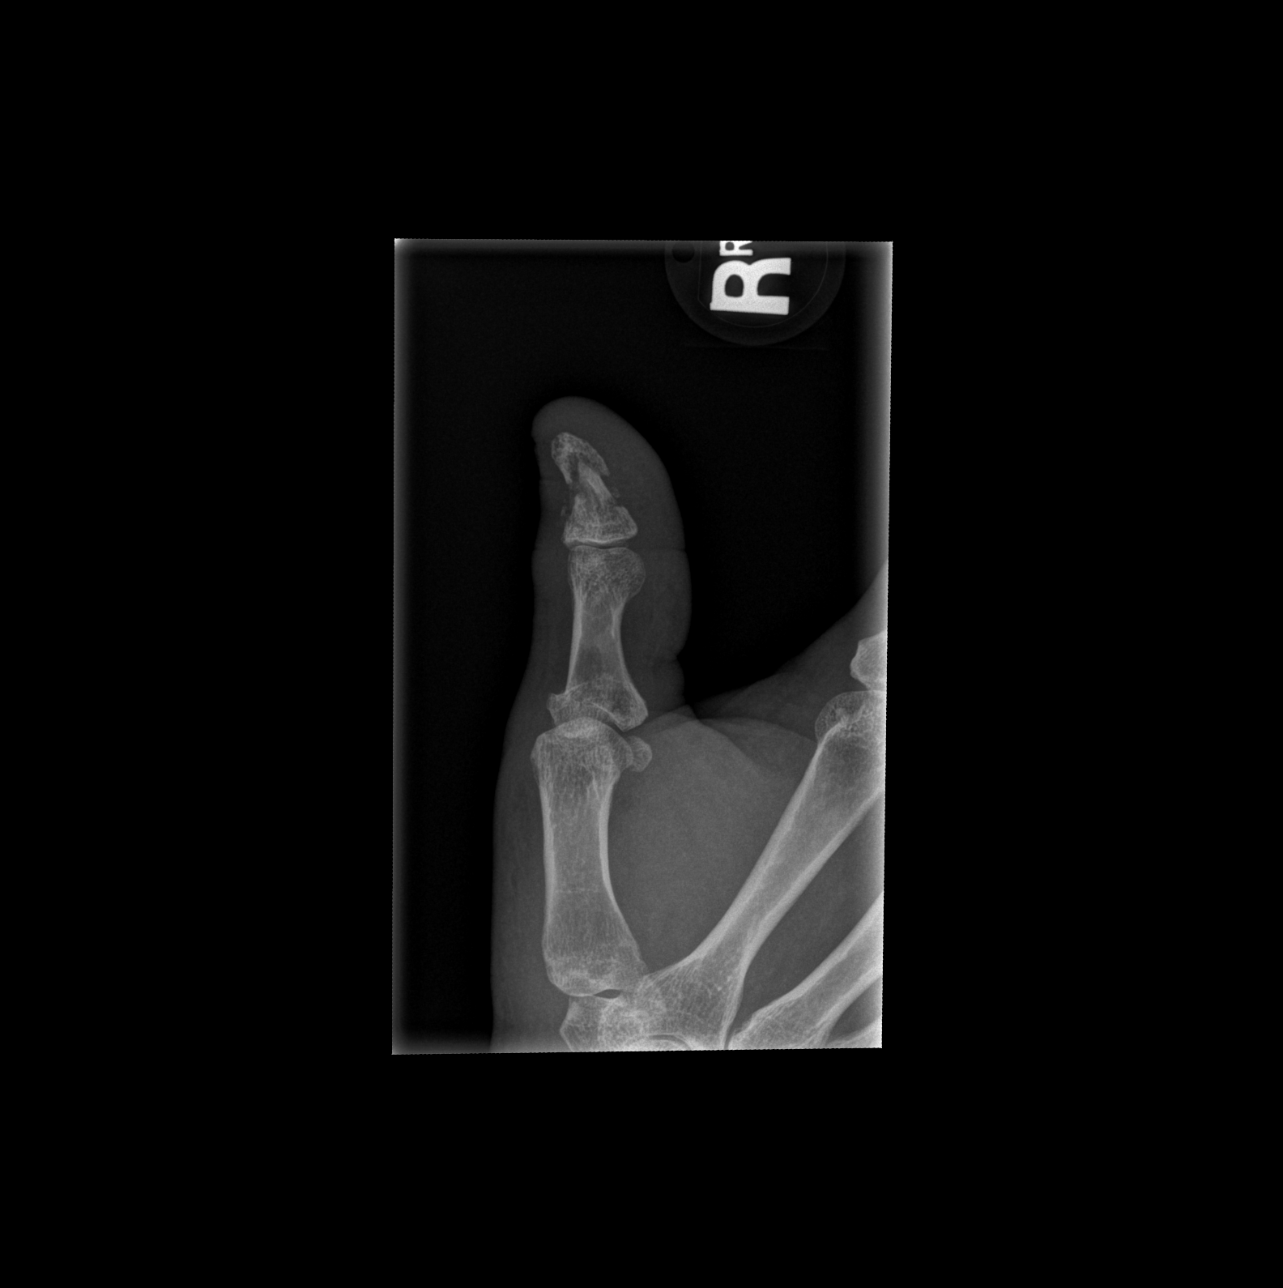

[x finger obl right]
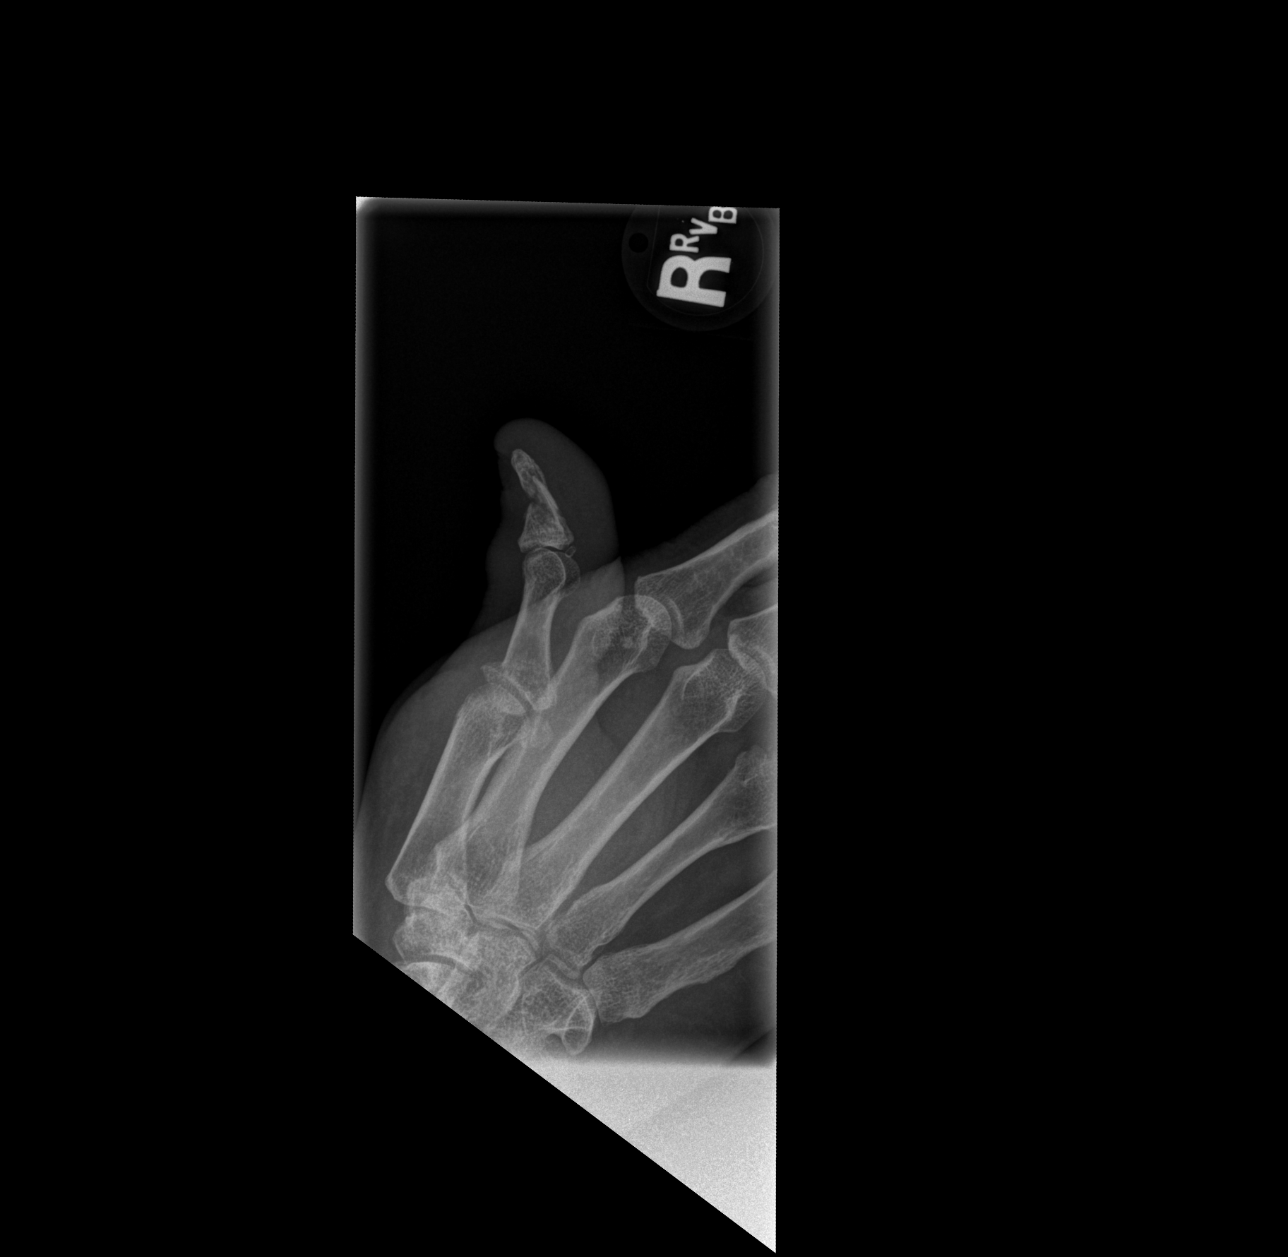

[x finger lat right]
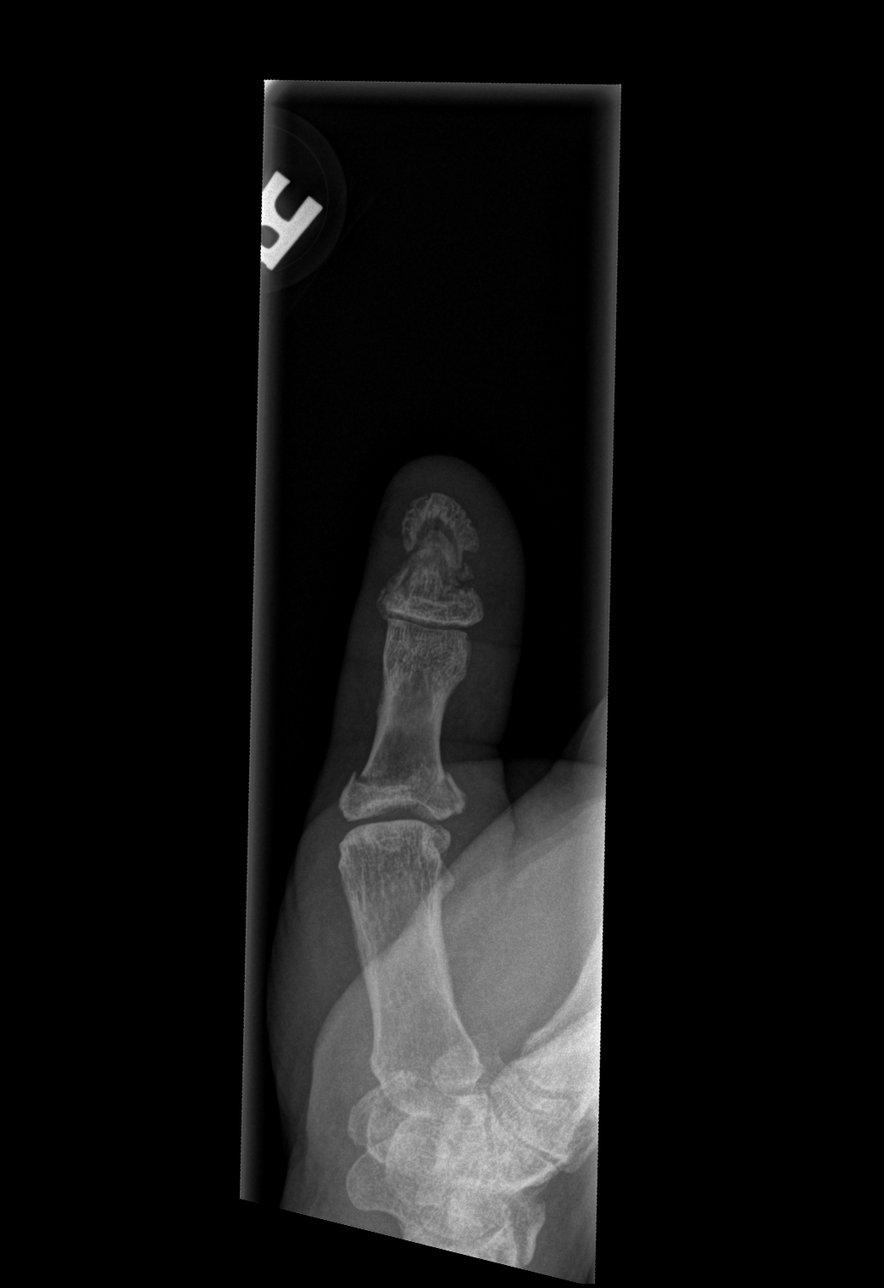

[3 of 3 positions shown; findings below may reference images not displayed]

FINDINGS: Comminuted fracture with mild displacement of the distal phalanx of
the RIGHT thumb. Soft tissue swelling overlying the RIGHT thumb.

Comminuted T-shaped fracture at the base of the proximal phalanx.
Some impaction of the fracture and evidence of intra-articular
extension. Mild anterior displacement of the distal aspect of the
proximal phalanx
IMPRESSION: Comminuted fractures of the distal and proximal phalanges of the
RIGHT thumb, as described above. Intra-articular extension noted
about the proximal phalangeal fracture at the base of the proximal
phalanx.

Comminuted fracture at the distal phalanx underlies the nail bed.
Correlate with any signs that would indicate the possibility "open
fracture" with nail bed injury.

## 2021-08-19 IMAGING — CT CT CERVICAL SPINE W/O CM
3 of 4 series · 13 of 33 positions shown, 16 images · non-contrast
Comparison: May 03, 2020

CLINICAL DATA: Pain following recent fall

EXAM:
CT CERVICAL SPINE WITHOUT CONTRAST
TECHNIQUE: Multidetector CT imaging of the cervical spine was performed without
intravenous contrast. Multiplanar CT image reconstructions were also
generated.

[Series 7: orthogonal bone · axial · 0.33mm/px · z∈[-236,-107]mm · 5 of 105 slices shown, 7 images]
[im 18/105  soft-tissue]
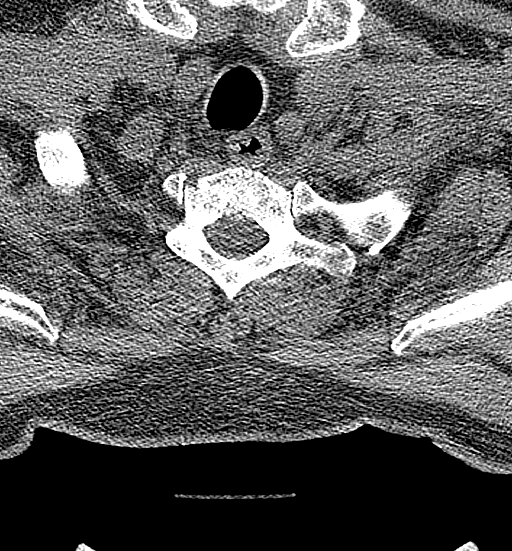
[im 18/105  bone]
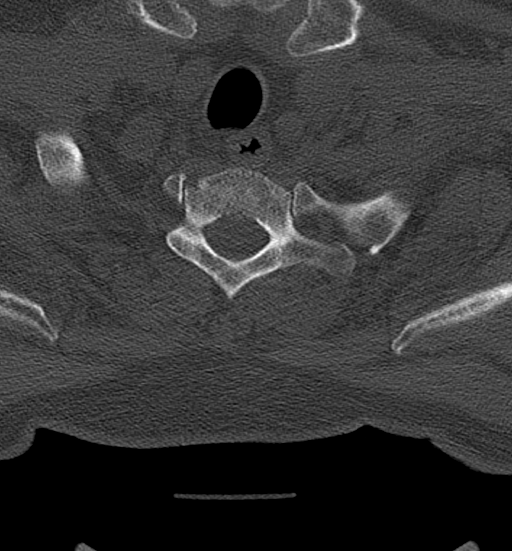
[im 35/105  bone]
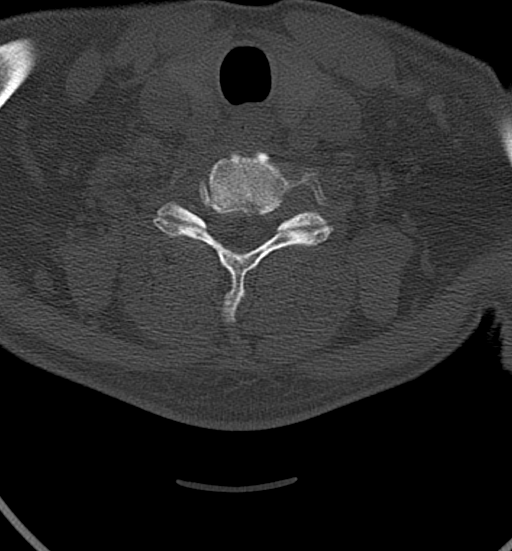
[im 53/105  bone]
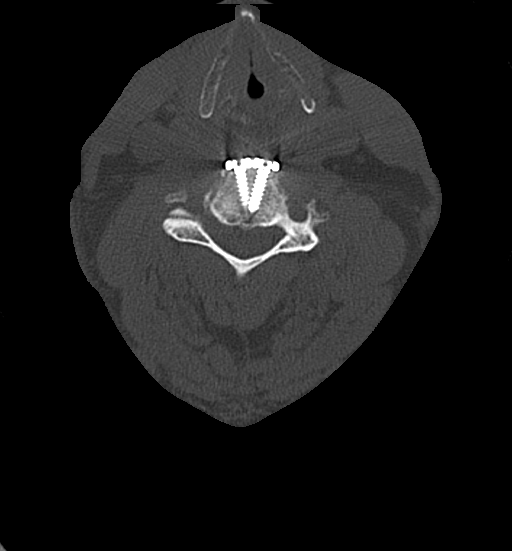
[im 70/105  bone]
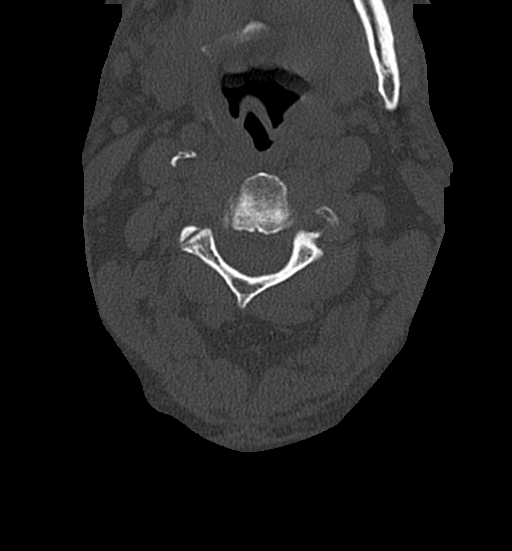
[im 87/105  soft-tissue]
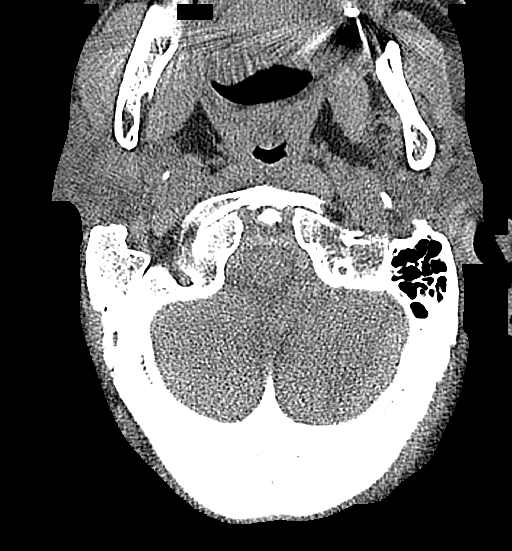
[im 87/105  bone]
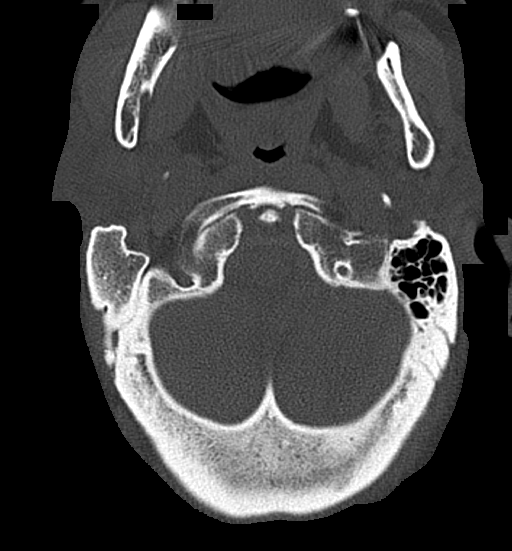

[Series 8: coronal bone · coronal · 0.31mm/px · 3 of 51 slices shown]
[im 11/51  bone]
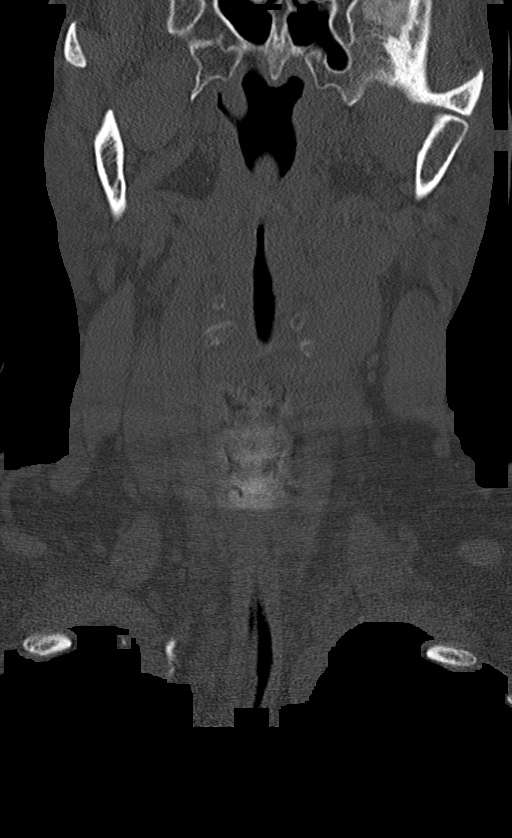
[im 21/51  bone]
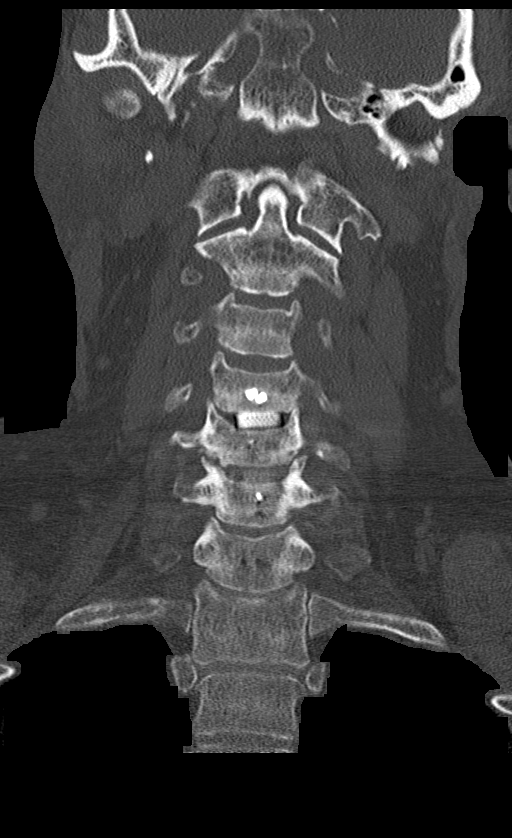
[im 31/51  bone]
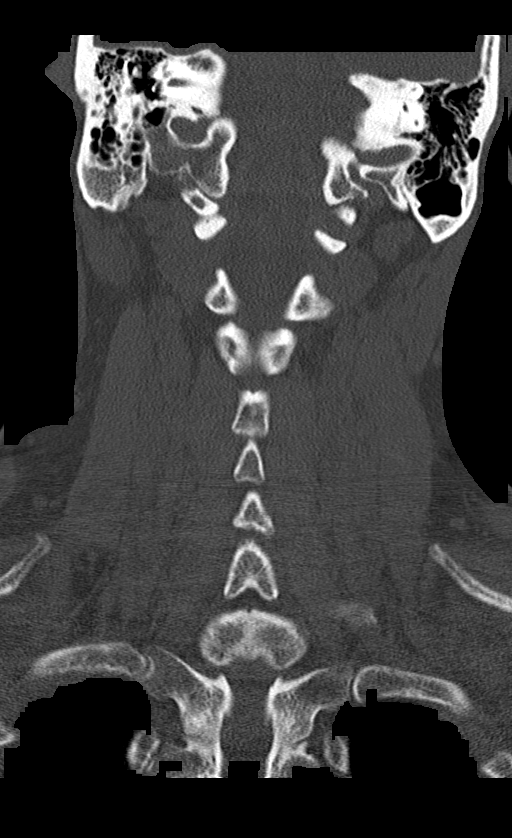

[Series 9: sagittal bone · sagittal · 0.40mm/px · 5 of 58 slices shown, 6 images]
[im 20/58  bone]
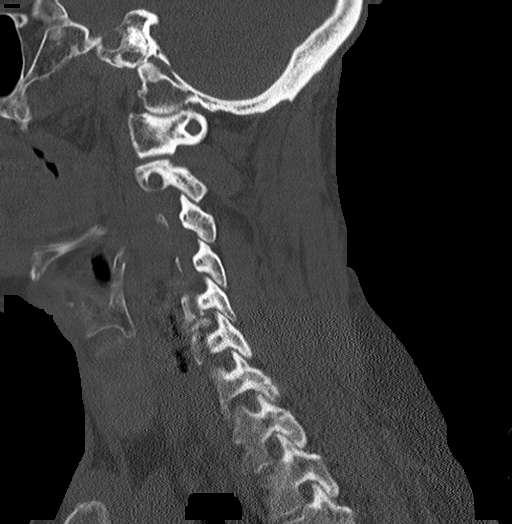
[im 24/58  bone]
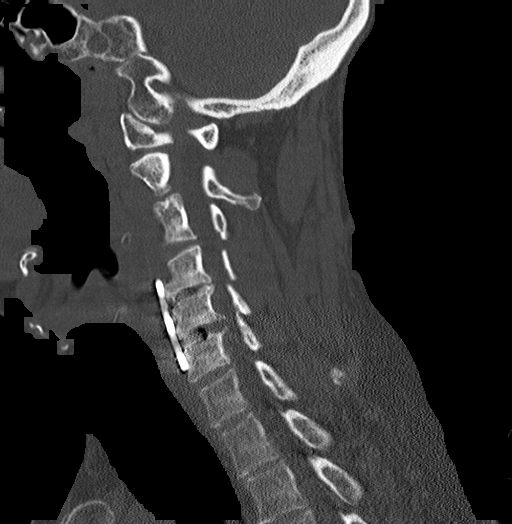
[im 29/58  soft-tissue]
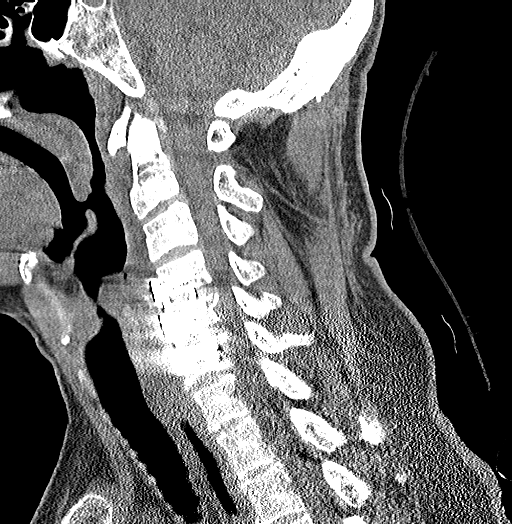
[im 29/58  bone]
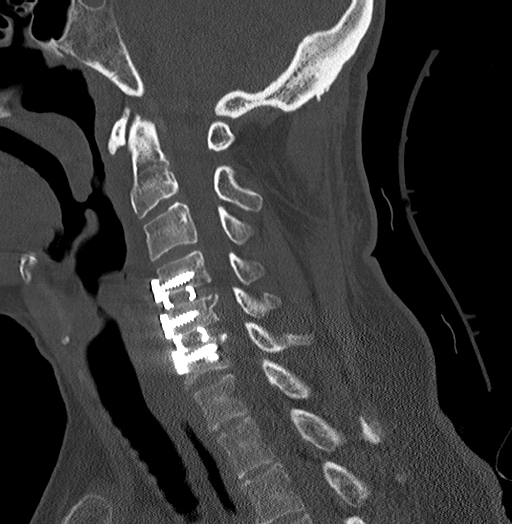
[im 34/58  bone]
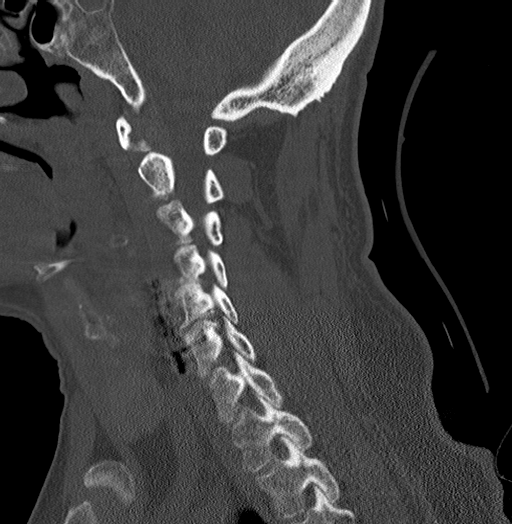
[im 39/58  bone]
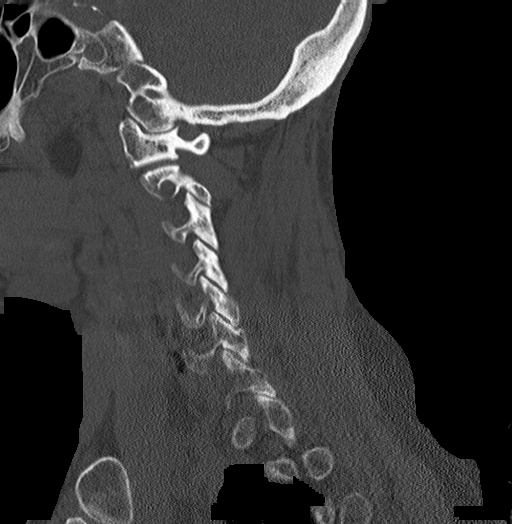

[13 of 33 positions shown; findings below may reference images not displayed]

FINDINGS: Alignment: There is no appreciable spondylolisthesis.

Skull base and vertebrae: Skull base and craniocervical junction
regions appear normal. The patient is status post anterior screw and
plate fixation from C4-C6 with disc spacers present at C4-5 and
C5-6. Support hardware intact. No acute fracture. No blastic or
lytic bone lesions.

Soft tissues and spinal canal: The prevertebral soft tissues and
predental space regions are normal. There is no appreciable cord or
canal hematoma. No paraspinous lesions are evident.

Disc levels: The disc spacers appear intact with disc spacers
present at C4-5 and C5-6. There is there is diffuse disc protrusion
at C4-5 with moderate spinal stenosis at this level. There is milder
spinal stenosis at C5-6. There is severe exit foraminal narrowing
bilaterally with impression on the exiting nerve roots bilaterally
at C5-6. There is exit foraminal narrowing due to asymmetric disc
protrusion at C6-7 on the left.

Upper chest: Visualized upper lung regions are clear.

Other: There is calcification in each proximal subclavian artery as
well as in each carotid artery.
IMPRESSION: 1.  No acute fracture or spondylolisthesis.

2. Status post anterior fusion from C4-C6 with disc spacers at C4-5
and C5-6. Support hardware intact.

3. Moderate spinal stenosis due to disc protrusion and bony
hypertrophy at C4-5. Mild spinal stenosis at C5-6 with severe exit
foraminal narrowing bilaterally due to bony hypertrophy.

4. Foci of calcification in each proximal subclavian and mid carotid
artery region.

## 2021-08-23 IMAGING — DX DG CHEST 1V PORT
2 series · 2 of 2 positions shown · non-contrast
Comparison: Portable exam 5359 hours compared to 07/31/2020

CLINICAL DATA: Cough, shortness of breath, and mid chest pain this
morning; history atrial fibrillation/flutter, CHF, cirrhosis,
diabetes mellitus, ethanol abuse, hypertension, non ischemic
cardiomyopathy, smoker, polysubstance abuse

EXAM:
PORTABLE CHEST 1 VIEW

[chest ap (1 of 2)]
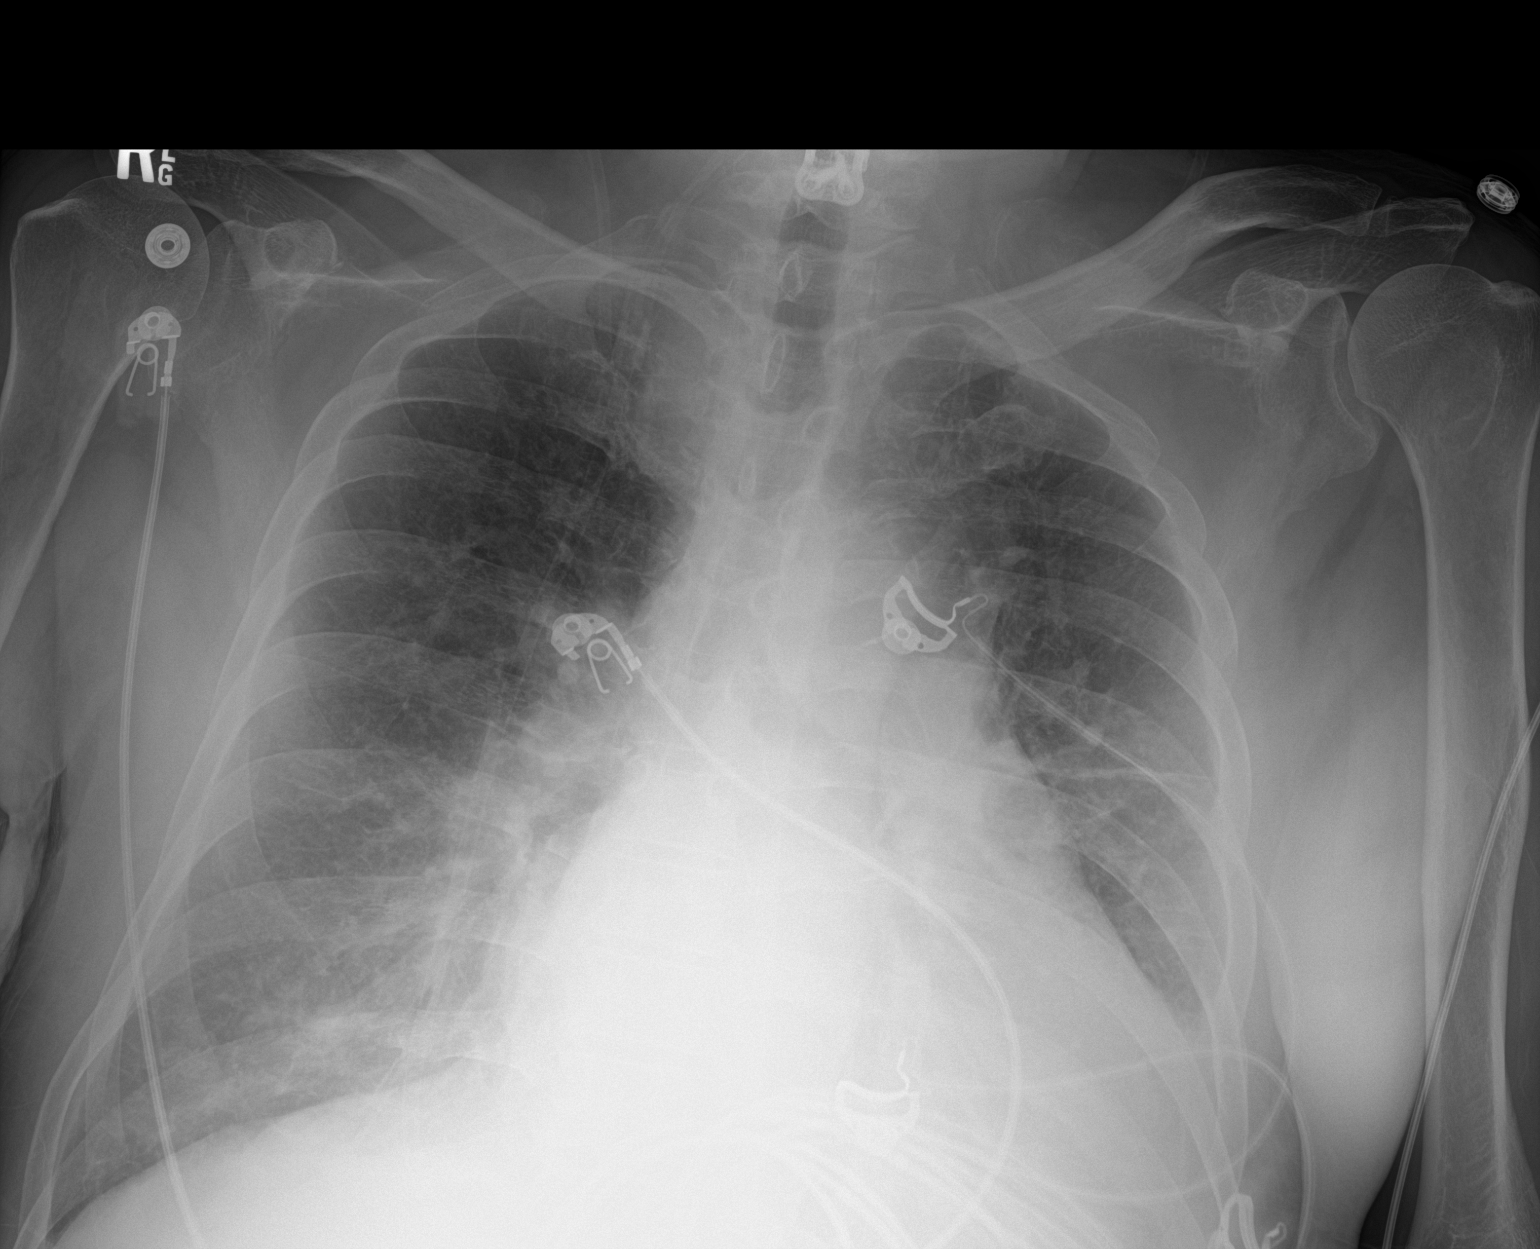

[chest ap (2 of 2)]
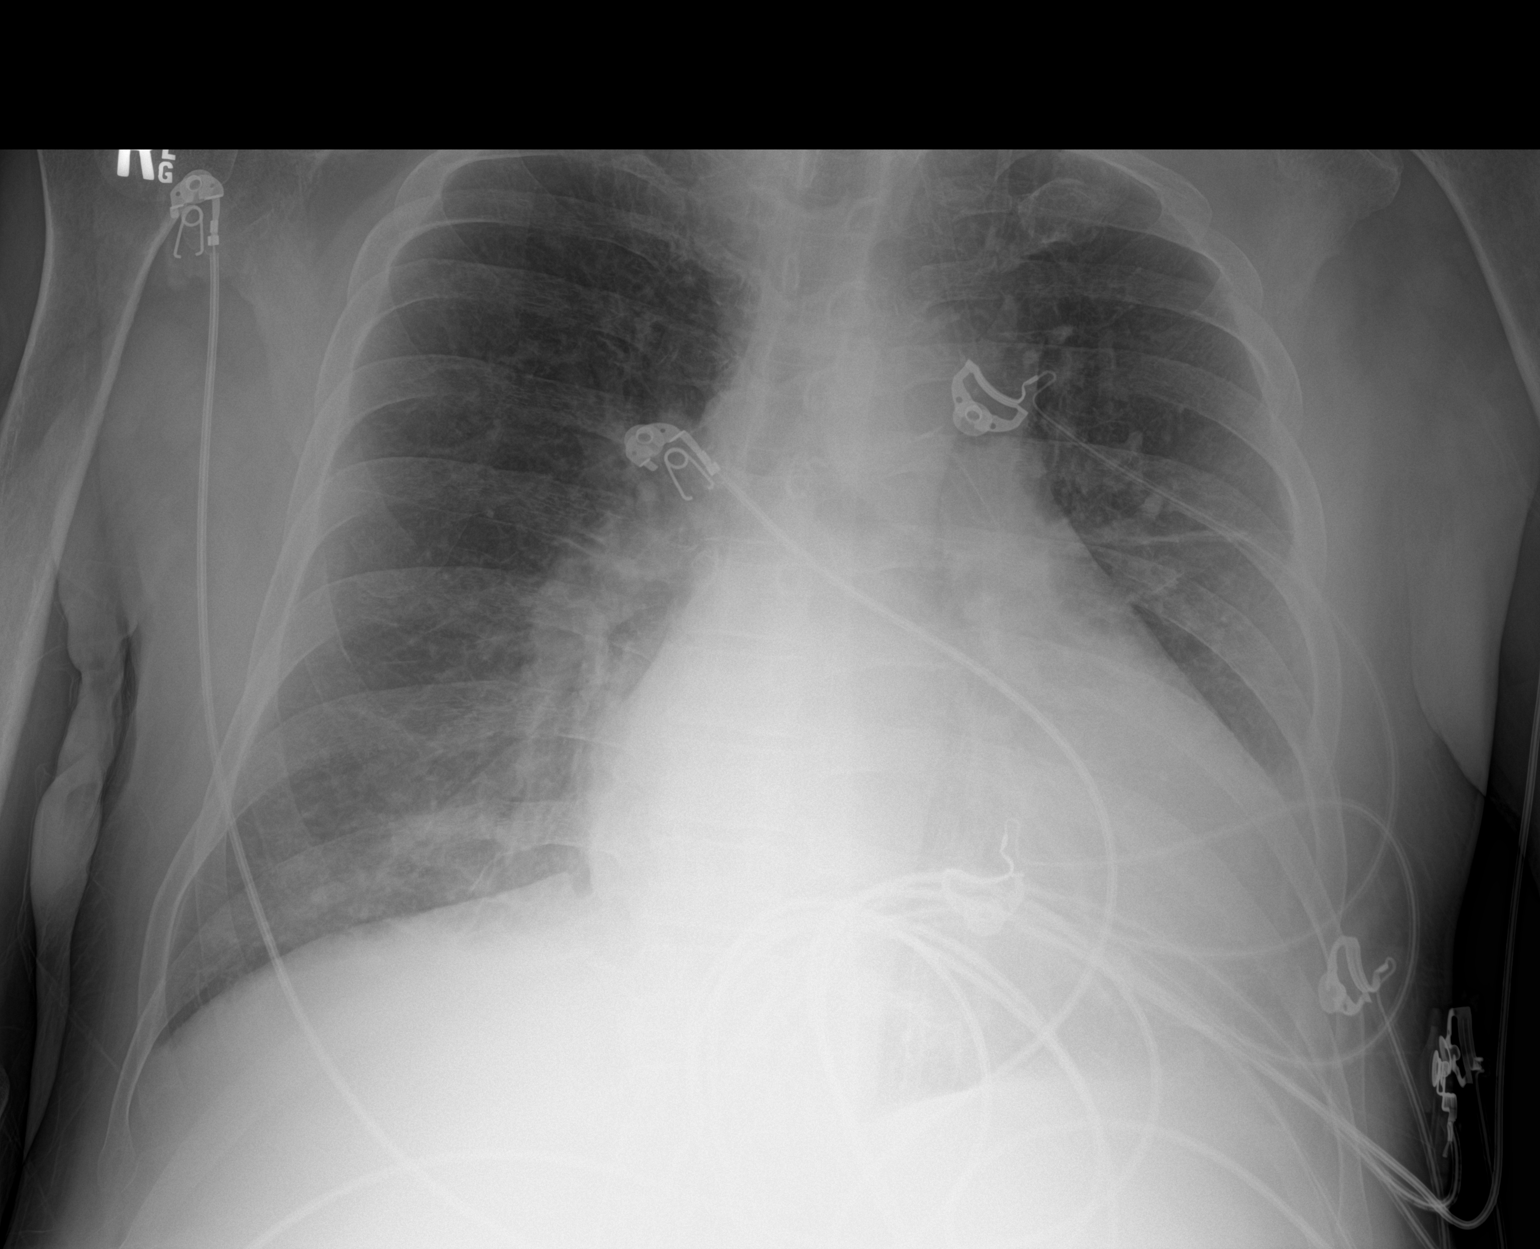

[2 of 2 positions shown; findings below may reference images not displayed]

FINDINGS: Enlargement of cardiac silhouette with pulmonary vascular
congestion.

Mediastinal contours normal for slight degree of rotation to the
LEFT.

Linear subsegmental atelectasis LEFT mid lung.

Hazy infiltrates in the lungs bilaterally, favor mild pulmonary
edema though infection not entirely exclude.

No pleural effusion or pneumothorax.

Prior cervical spine fusion.
IMPRESSION: Enlargement of cardiac silhouette with vascular congestion and
probable mild pulmonary edema as above.

Linear subsegmental atelectasis LEFT upper lobe.

## 2021-08-25 IMAGING — DX DG CHEST 1V PORT
1 series · 1 of 1 positions shown · non-contrast
Comparison: Two days ago

CLINICAL DATA: Increasing shortness of breath

EXAM:
PORTABLE CHEST 1 VIEW

[chest ap]
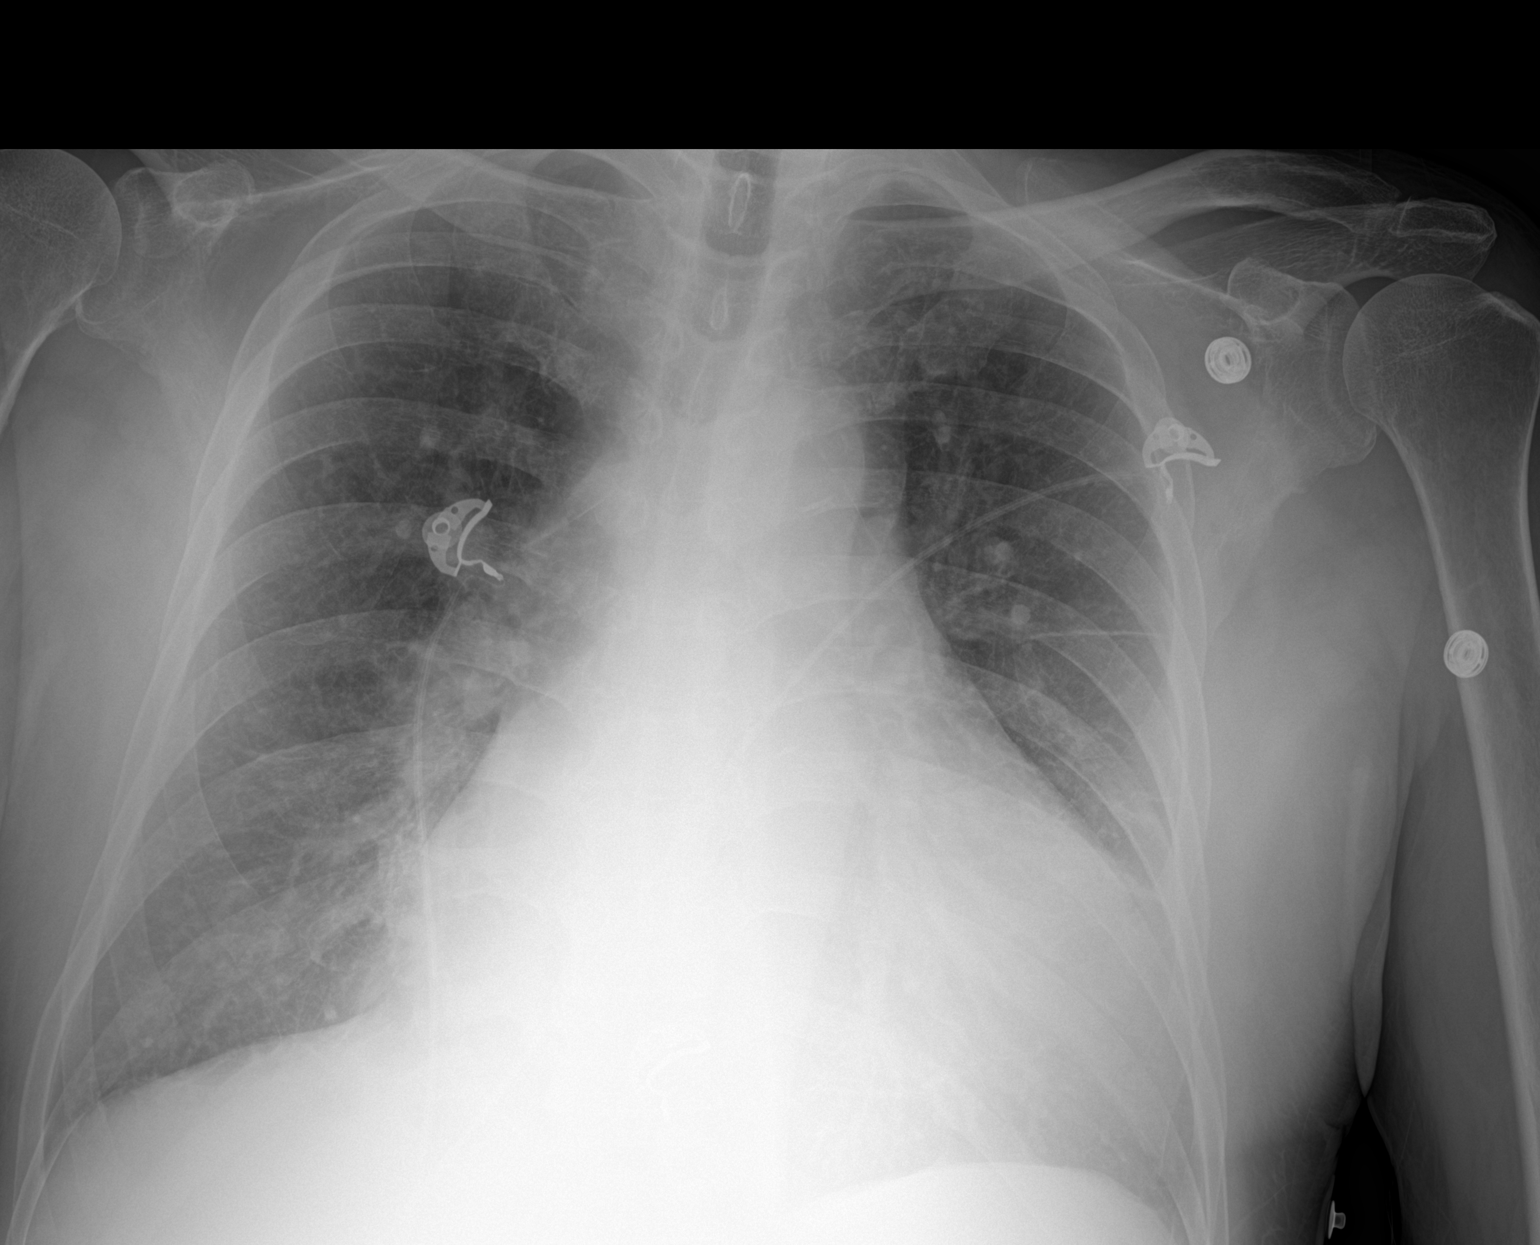

[1 of 1 positions shown; findings below may reference images not displayed]

FINDINGS: Cardiomegaly and symmetric vascular prominence. No Kerley lines,
effusion, or air leak.
IMPRESSION: Cardiomegaly and venous congestion without progression from 2 days
ago.

## 2021-09-07 IMAGING — CR DG CHEST 2V
2 series · 2 of 2 positions shown · non-contrast
Comparison: 08/22/2020

CLINICAL DATA: Shortness of breath

EXAM:
CHEST - 2 VIEW

[w chest lat]
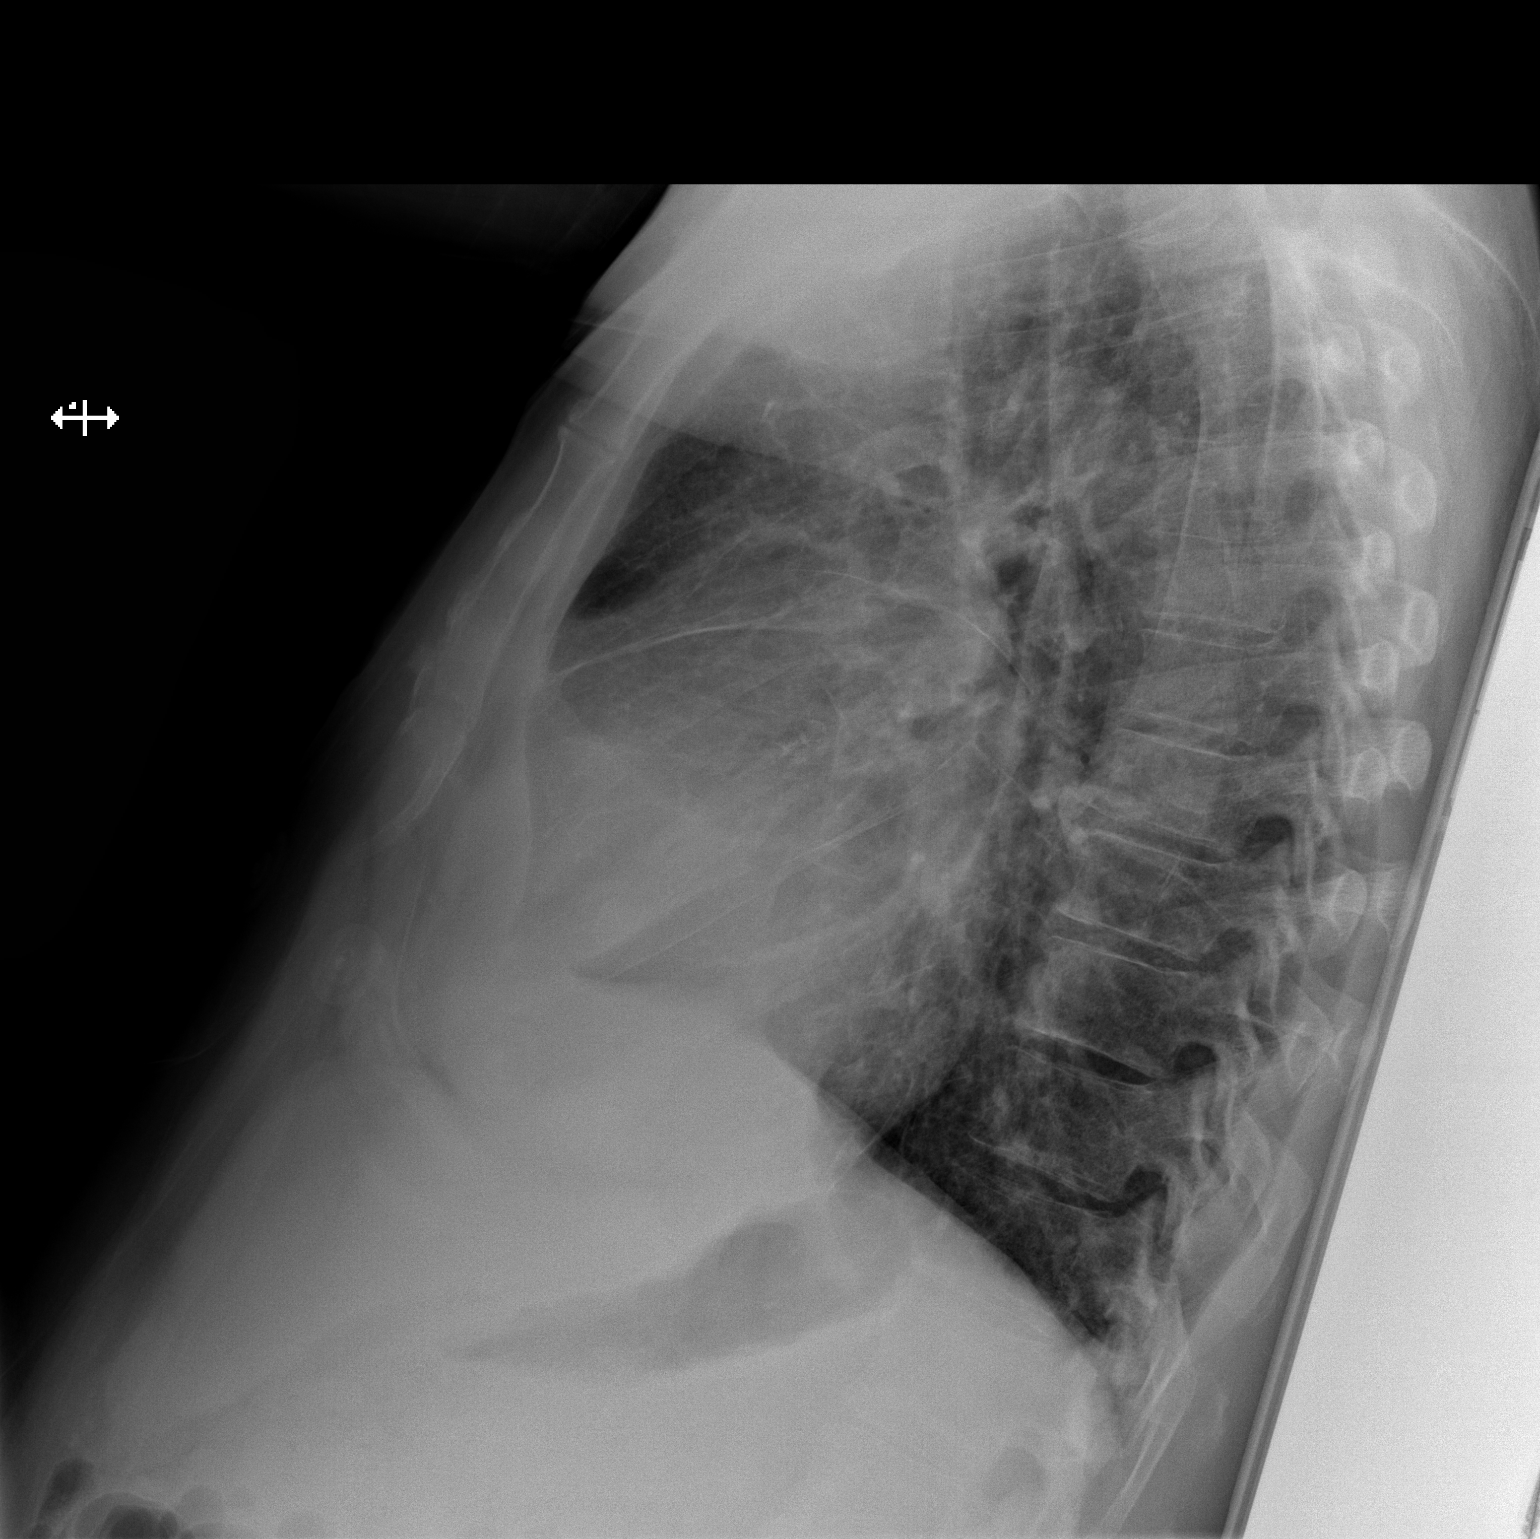

[x chest ap]
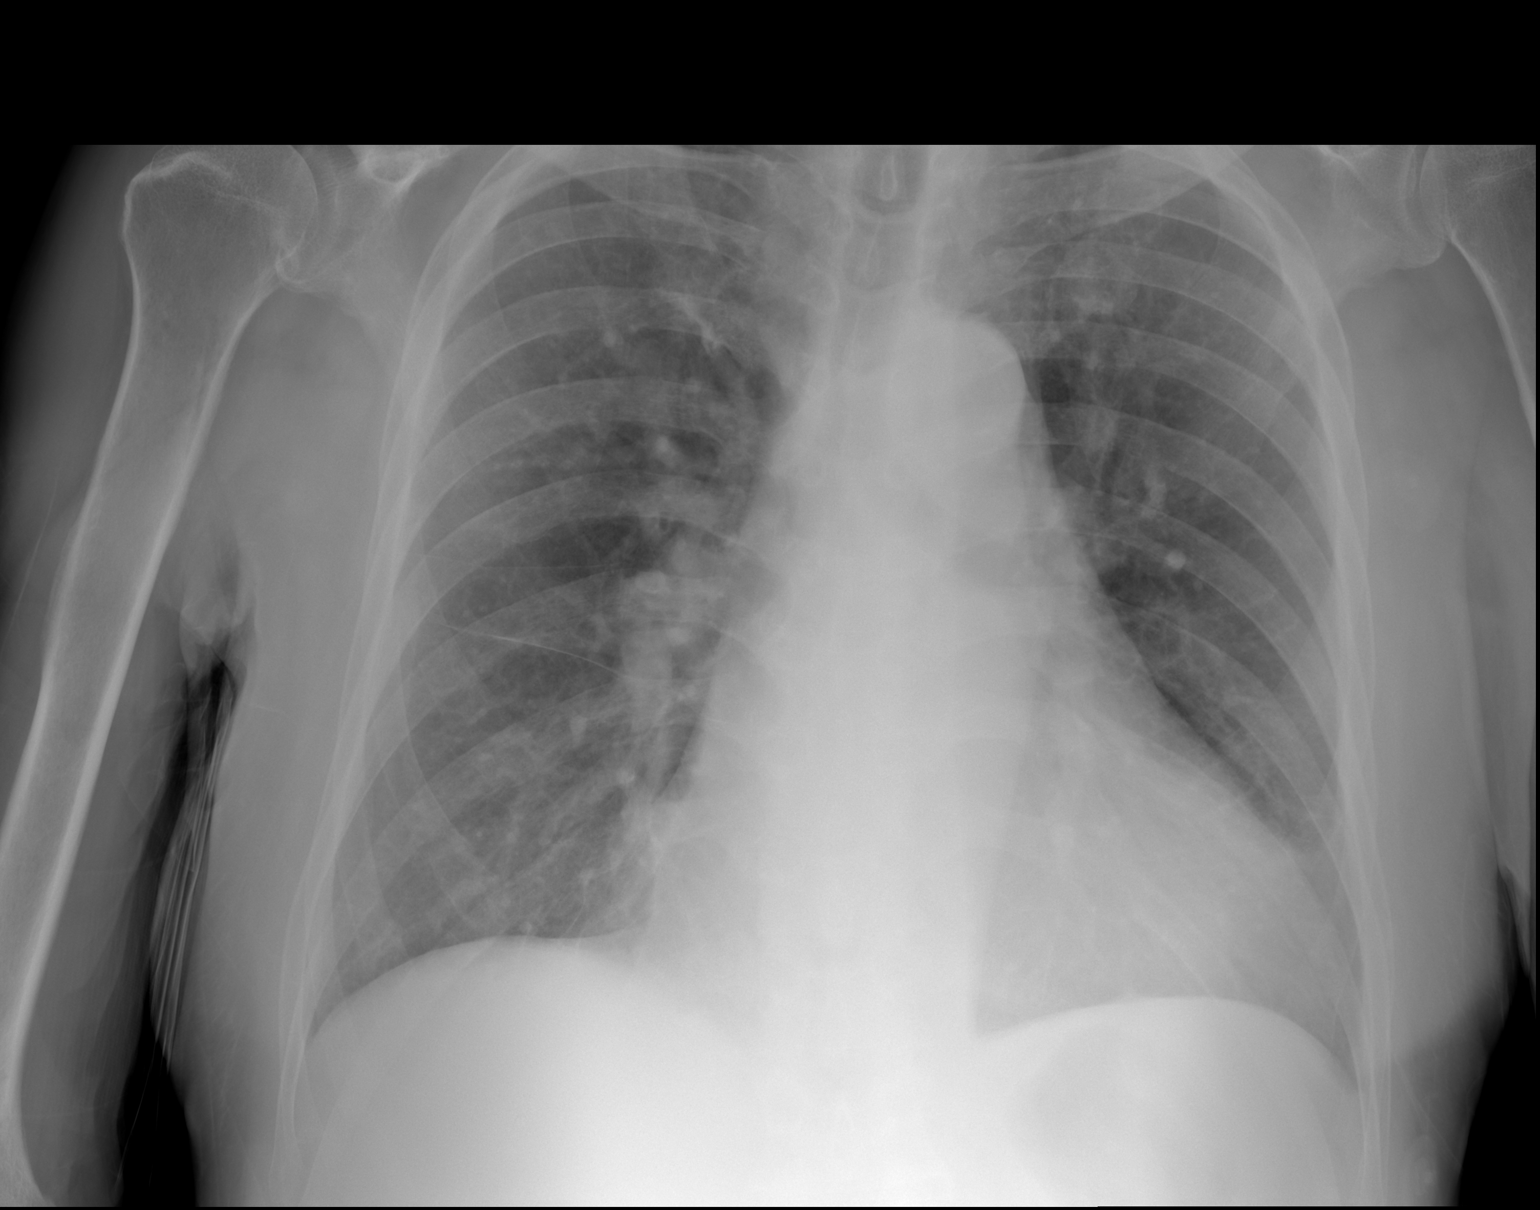

[2 of 2 positions shown; findings below may reference images not displayed]

FINDINGS: Cardiomegaly. There is no edema, consolidation, effusion, or
pneumothorax. No acute osseous finding.
IMPRESSION: No acute finding.

Chronic cardiomegaly.

## 2021-09-07 IMAGING — CR DG CERVICAL SPINE 2-3V CLEARING
2 series · 2 of 2 positions shown · non-contrast
Comparison: July 30, 2020

CLINICAL DATA: Fall at home complaining of neck pain and shortness
of breath.

EXAM:
LIMITED CERVICAL SPINE FOR TRAUMA CLEARING - 2-3 VIEW

[w cervical spine lat]
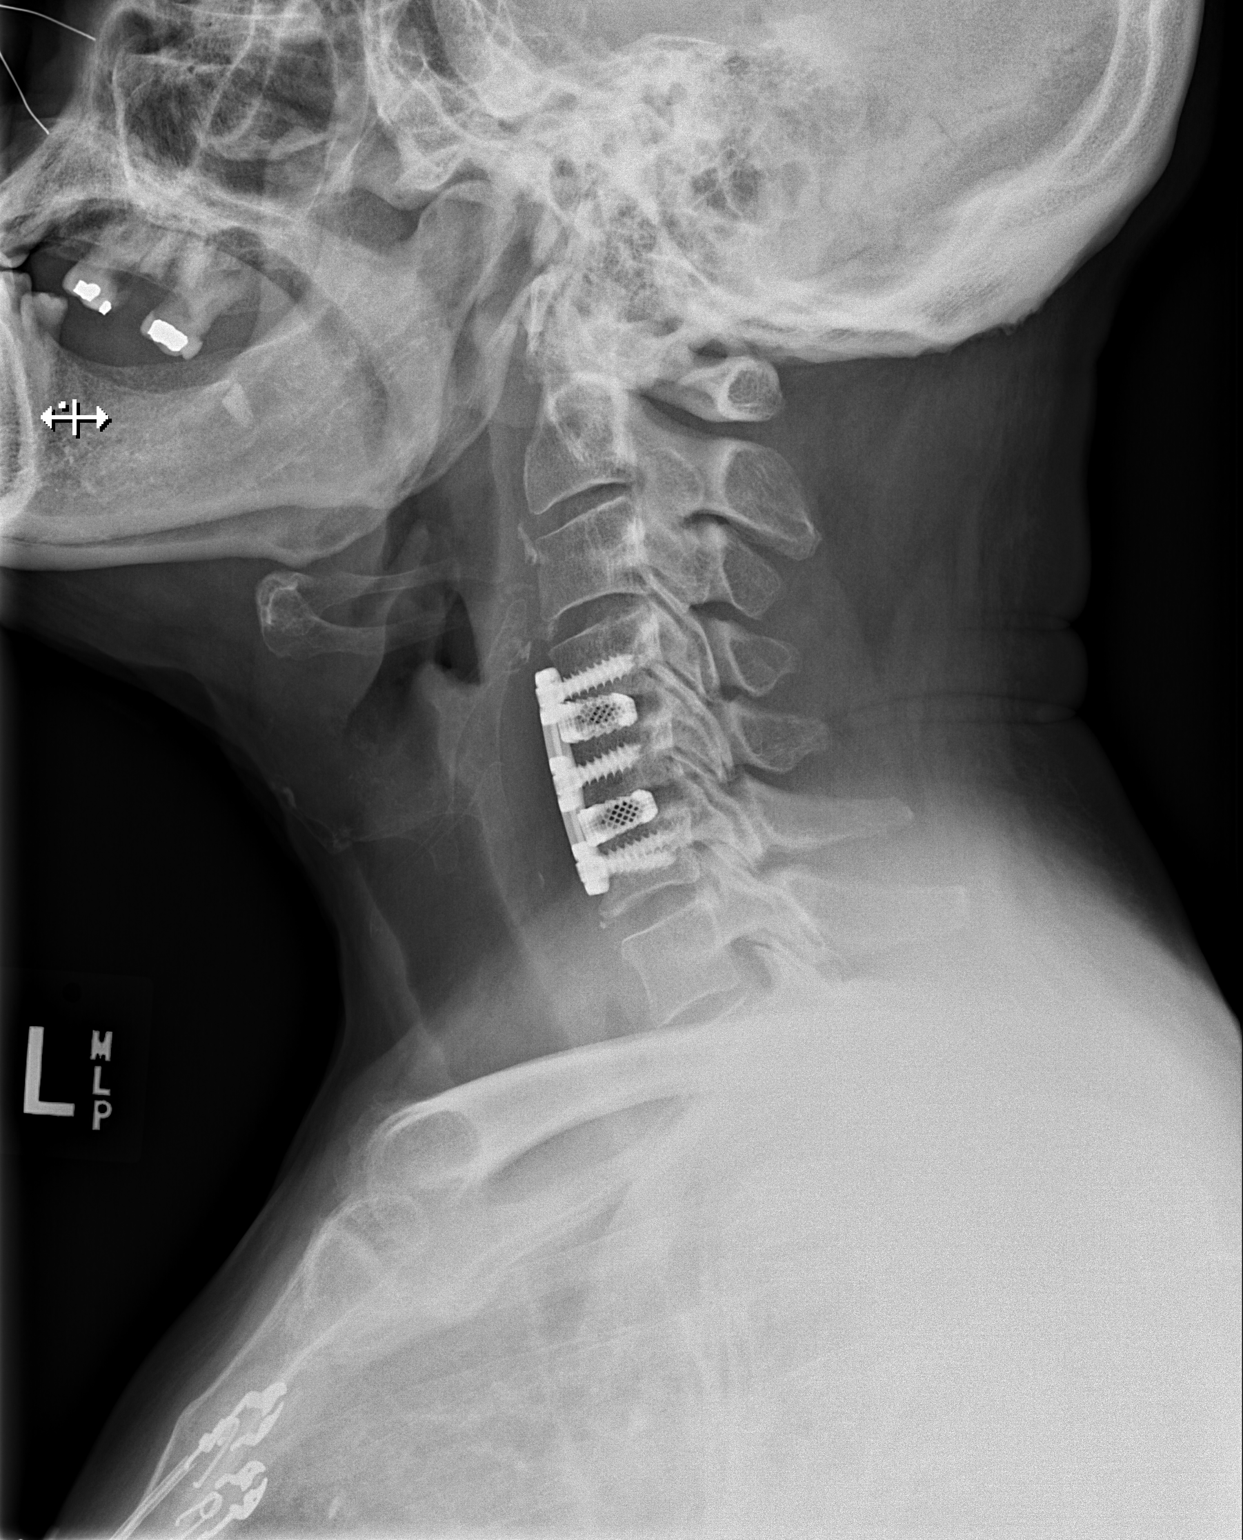

[w cervical spine ap]
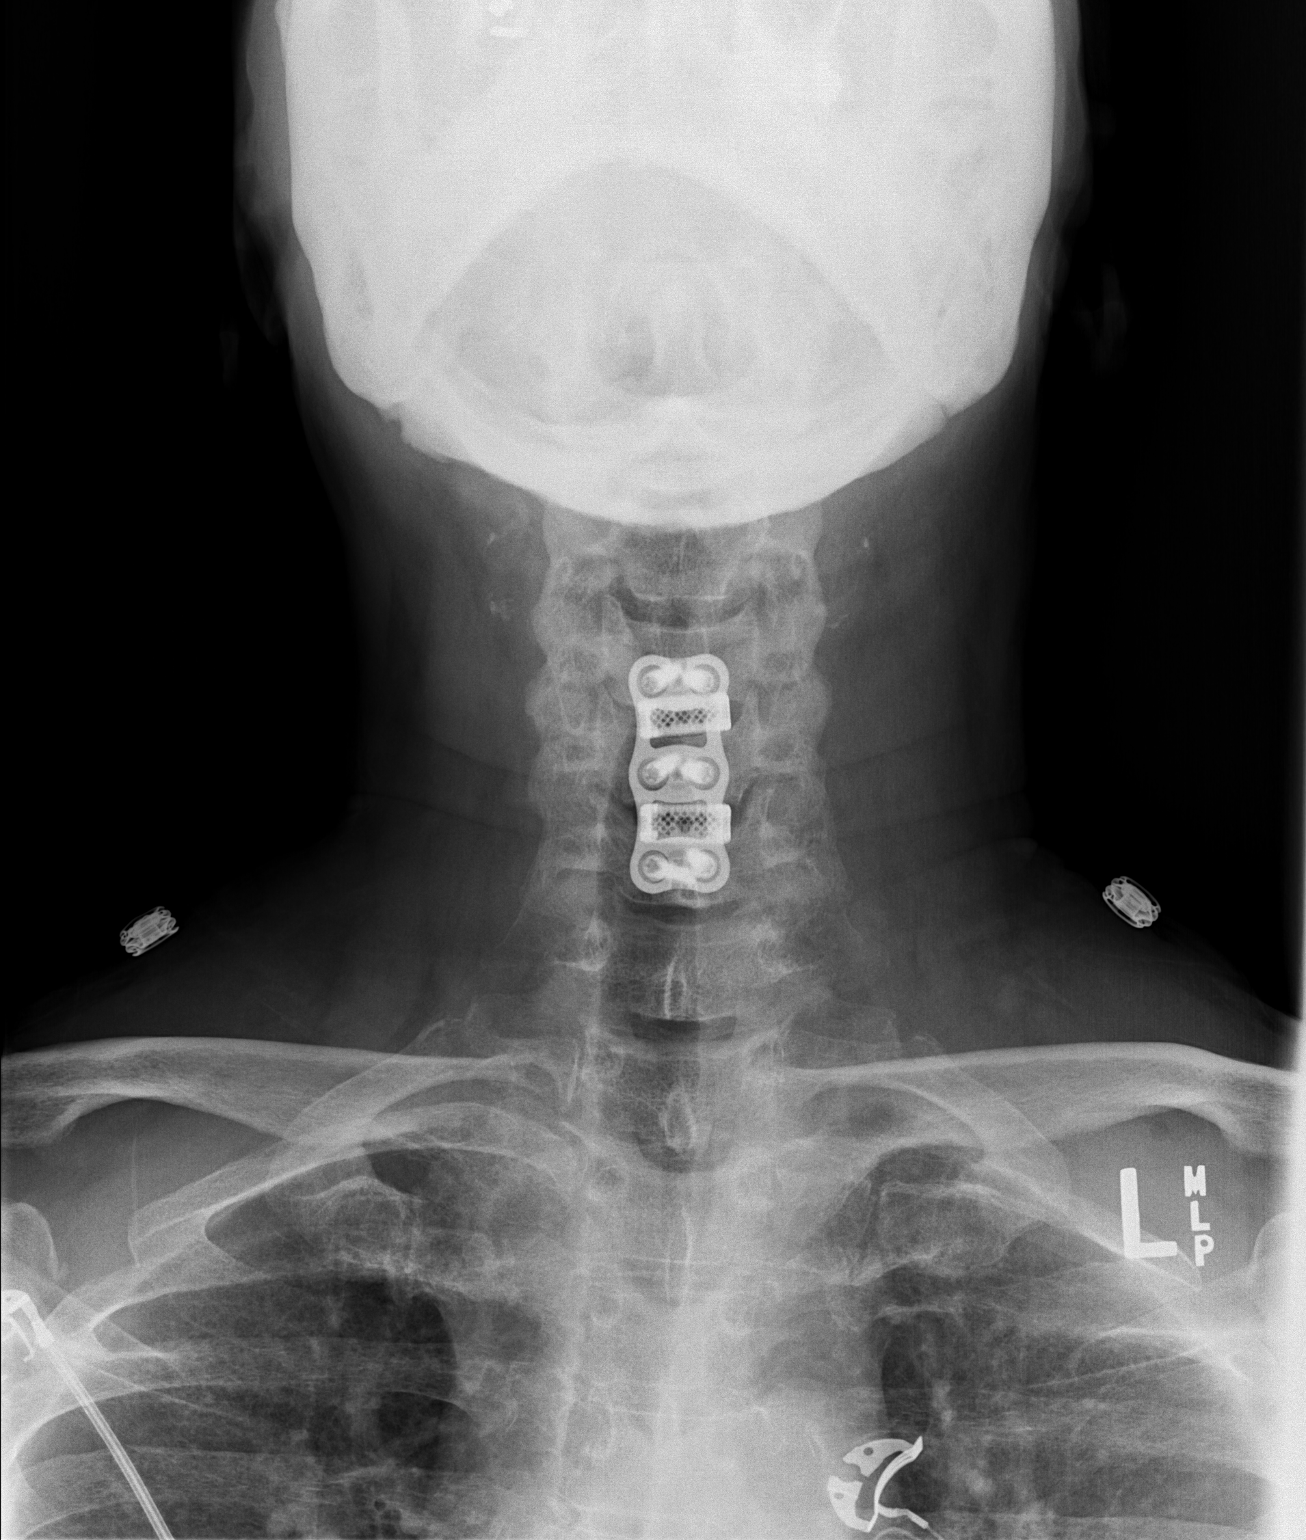

[2 of 2 positions shown; findings below may reference images not displayed]

FINDINGS: Decreased prevertebral soft tissue swelling when compared to
postoperative radiograph from August 20, 2020.

Cervical spine with limited assessment of the cervicothoracic
junction on today's exam only the superior most aspect of the T1
vertebral body is imaged.

No sign of static subluxation.  No visible fracture.

Limited assessment also of the upper cervical spine on the AP view,
no open-mouth view is provided.
IMPRESSION: 1. Decreased prevertebral soft tissue swelling when compared to
postoperative radiograph from [DATE] [DATE], [DATE].
[DATE]. No signs of fracture or static subluxation, limited assessment of
the craniocervical junction and cervicothoracic junction as
described.

## 2021-09-07 IMAGING — DX DG FINGER THUMB 2+V*R*
3 series · 3 of 3 positions shown · non-contrast
Comparison: 08/16/2020

CLINICAL DATA: Fell several days ago with pain and swelling.

EXAM:
RIGHT THUMB 2+V

[finger ap]
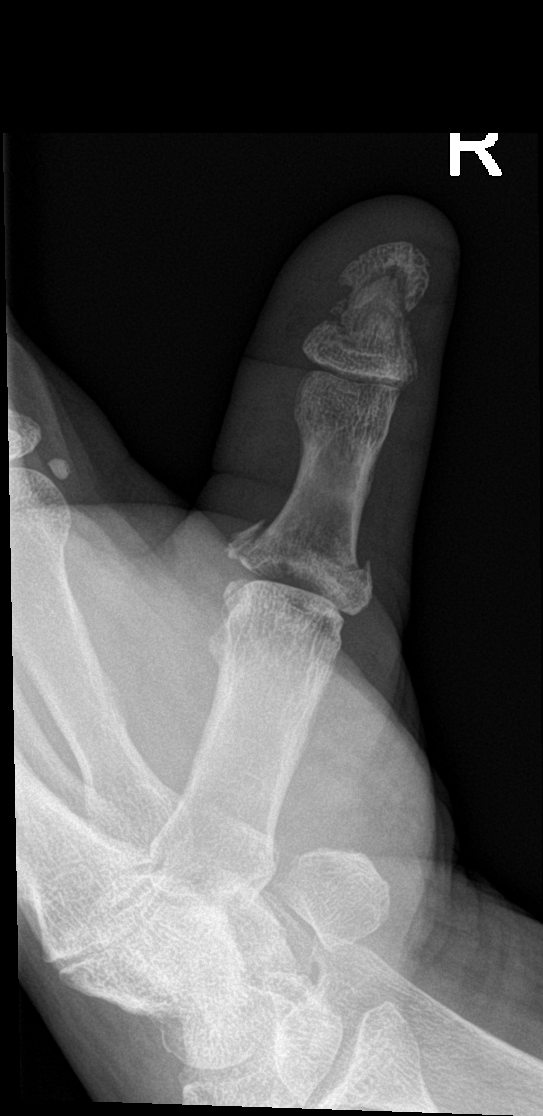

[finger obl]
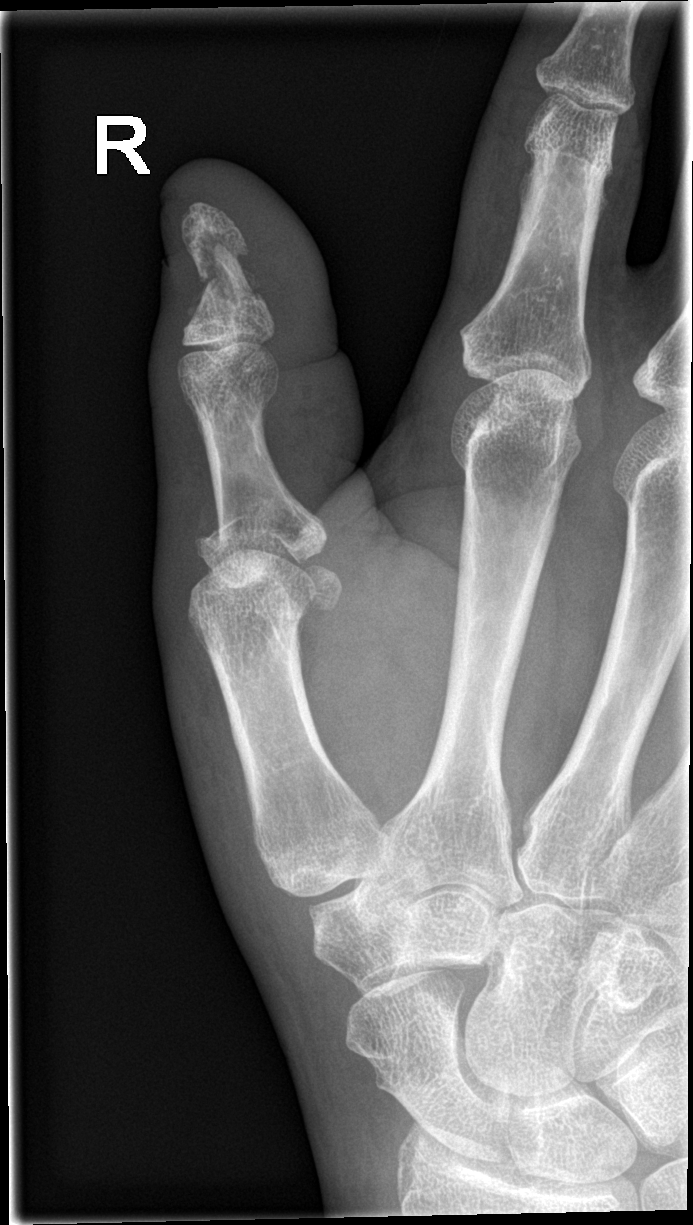

[finger lat]
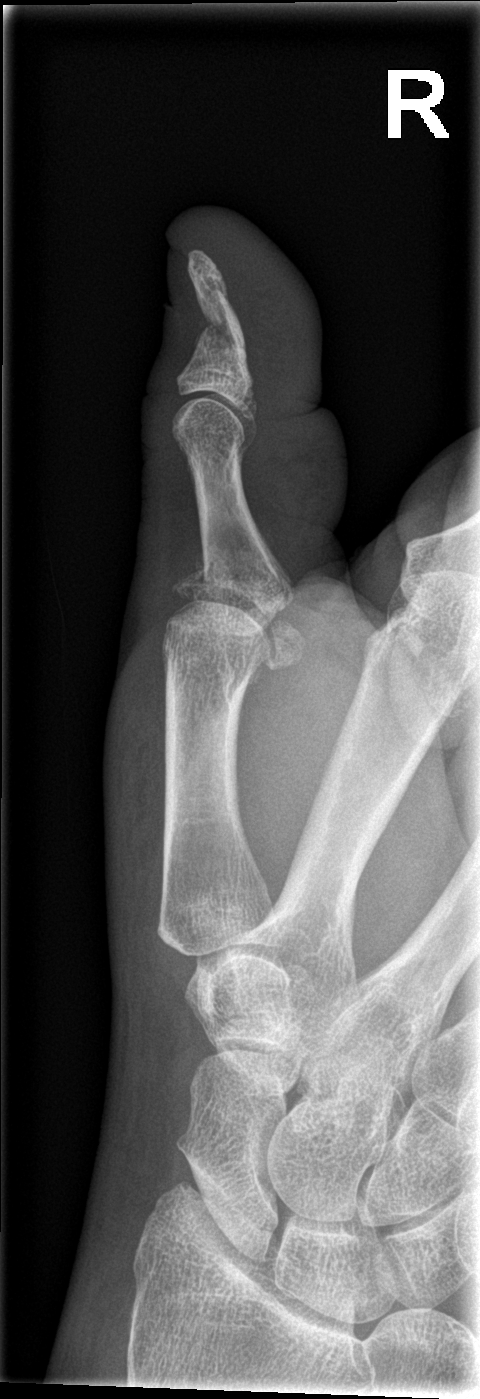

[3 of 3 positions shown; findings below may reference images not displayed]

FINDINGS: Redemonstration of a comminuted fracture of the distal phalanx of
the thumb and of a comminuted fracture at the base of the proximal
phalanx of the thumb with intra-articular extension. No change in
position or alignment.
IMPRESSION: Comminuted fractures of the distal phalanx and proximal phalanx of
the thumb. No change in position or alignment.

## 2021-09-21 IMAGING — DX DG CHEST 2V
2 series · 3 of 3 positions shown · non-contrast
Comparison: None.

CLINICAL DATA: Chest pain.

EXAM:
CHEST - 2 VIEW

[Series 2: chest lat · 0.14mm/px · 2 of 2 slices shown]
[im 1/2]
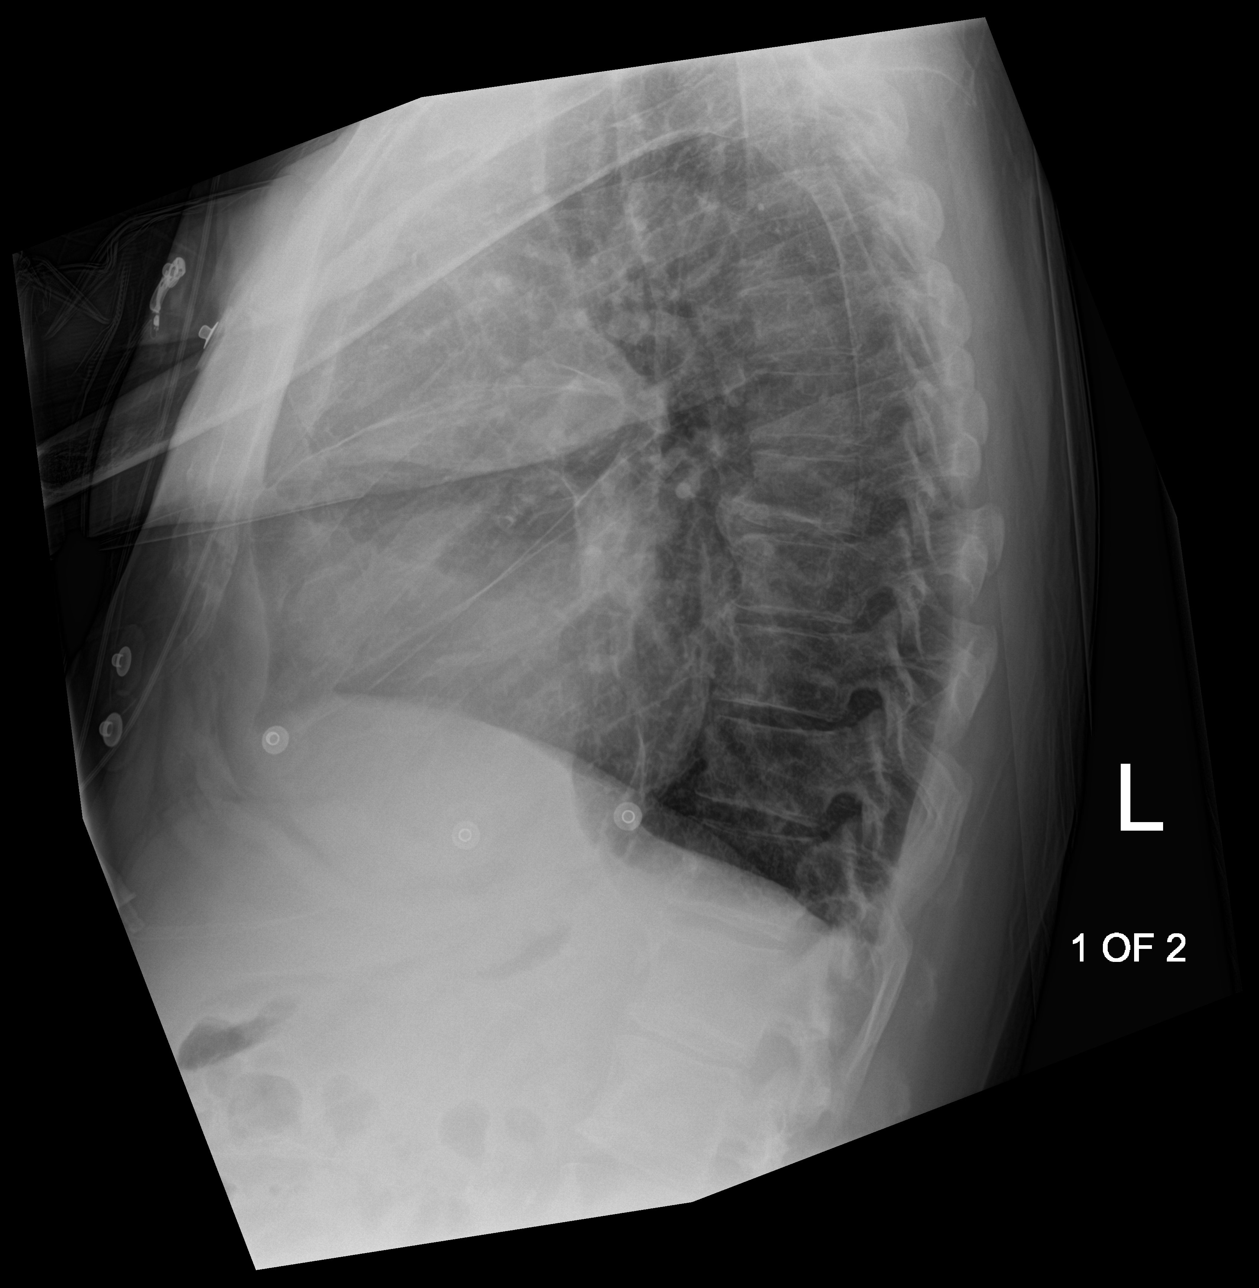
[im 2/2]
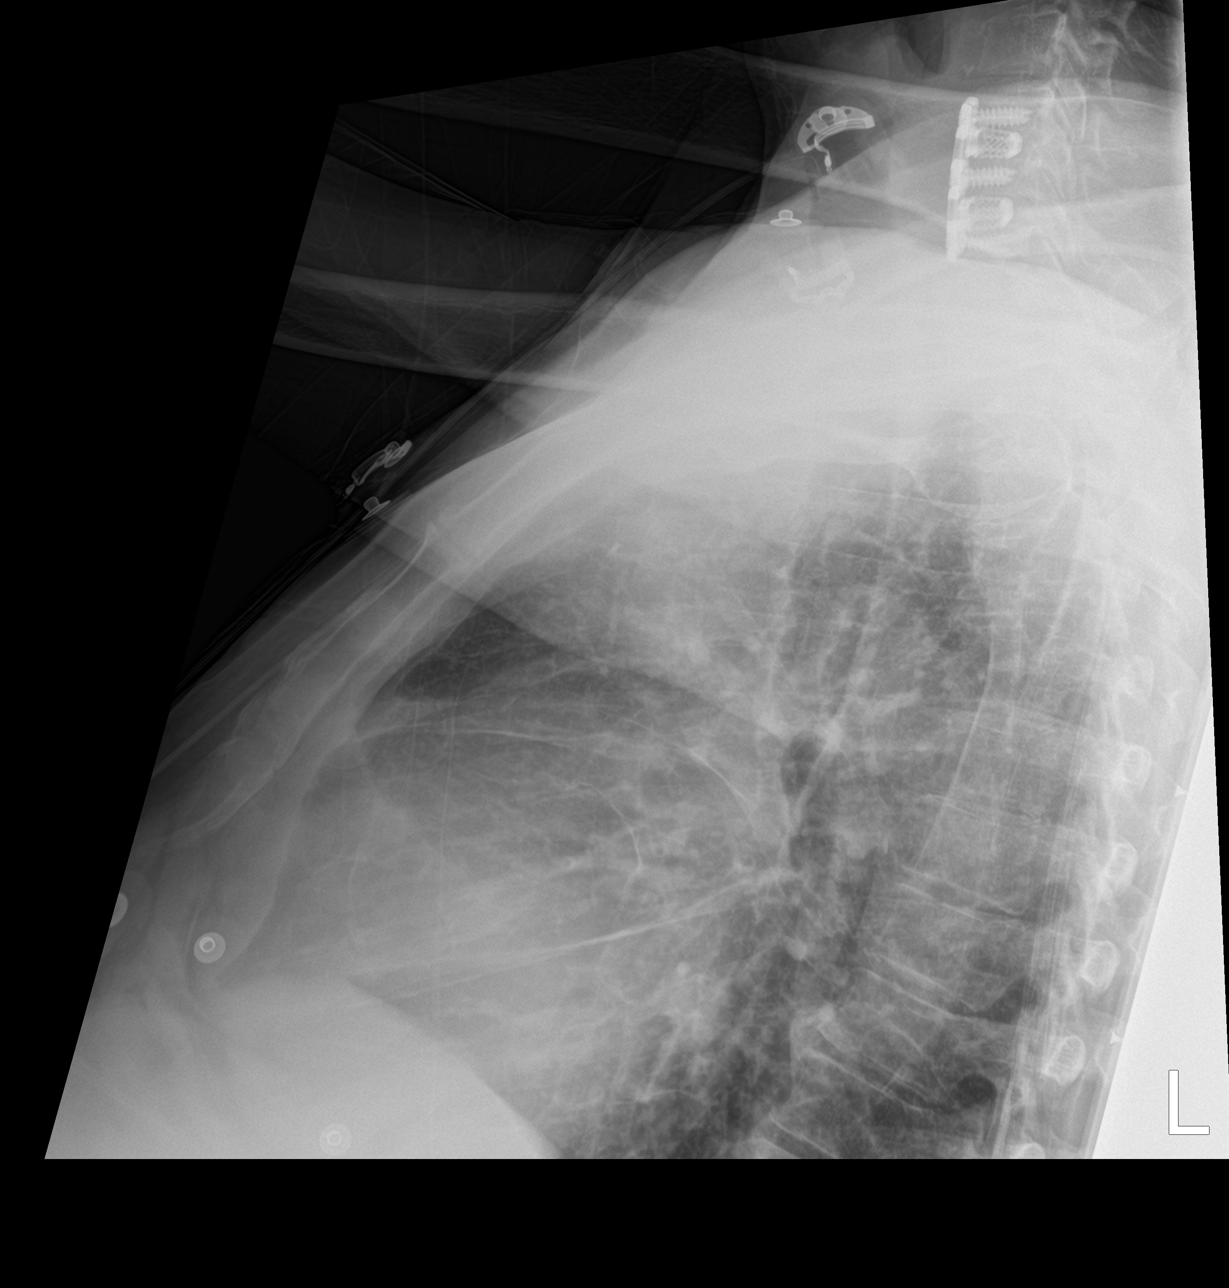

[chest ap]
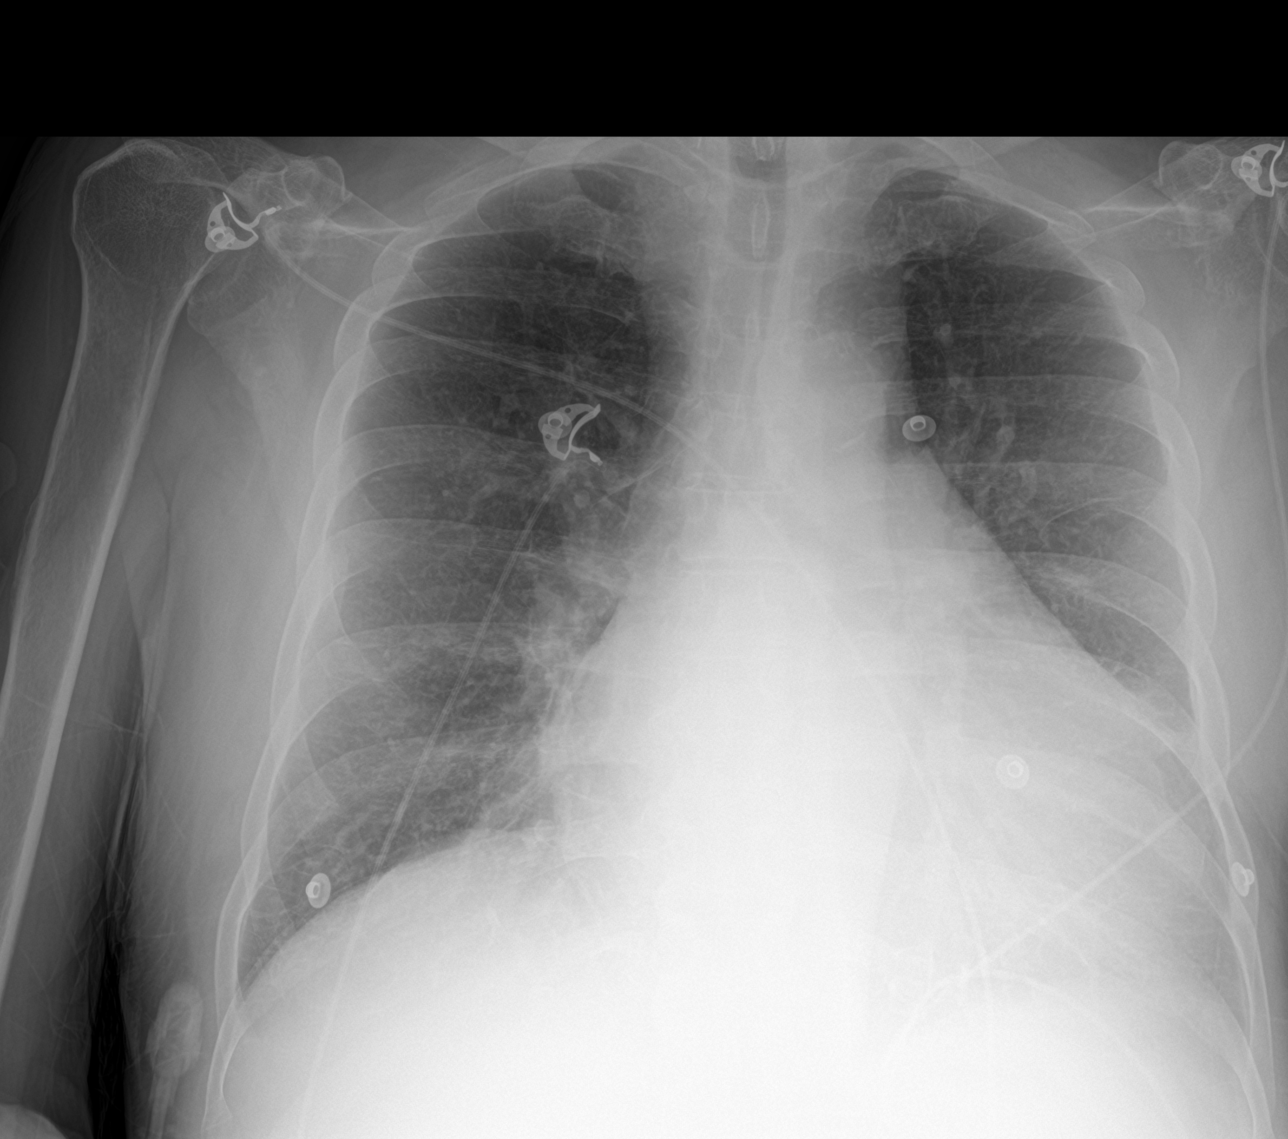

[3 of 3 positions shown; findings below may reference images not displayed]

FINDINGS: There is no evidence of acute infiltrate, pleural effusion or
pneumothorax. The cardiac silhouette is markedly enlarged. The
visualized skeletal structures are unremarkable.
IMPRESSION: Cardiomegaly without evidence of acute or active cardiopulmonary
disease.

## 2021-10-08 IMAGING — DX DG CHEST 1V PORT
1 series · 1 of 1 positions shown · non-contrast
Comparison: September 18, 2020.

CLINICAL DATA: Shortness of breath.

EXAM:
PORTABLE CHEST 1 VIEW

[chest ap]
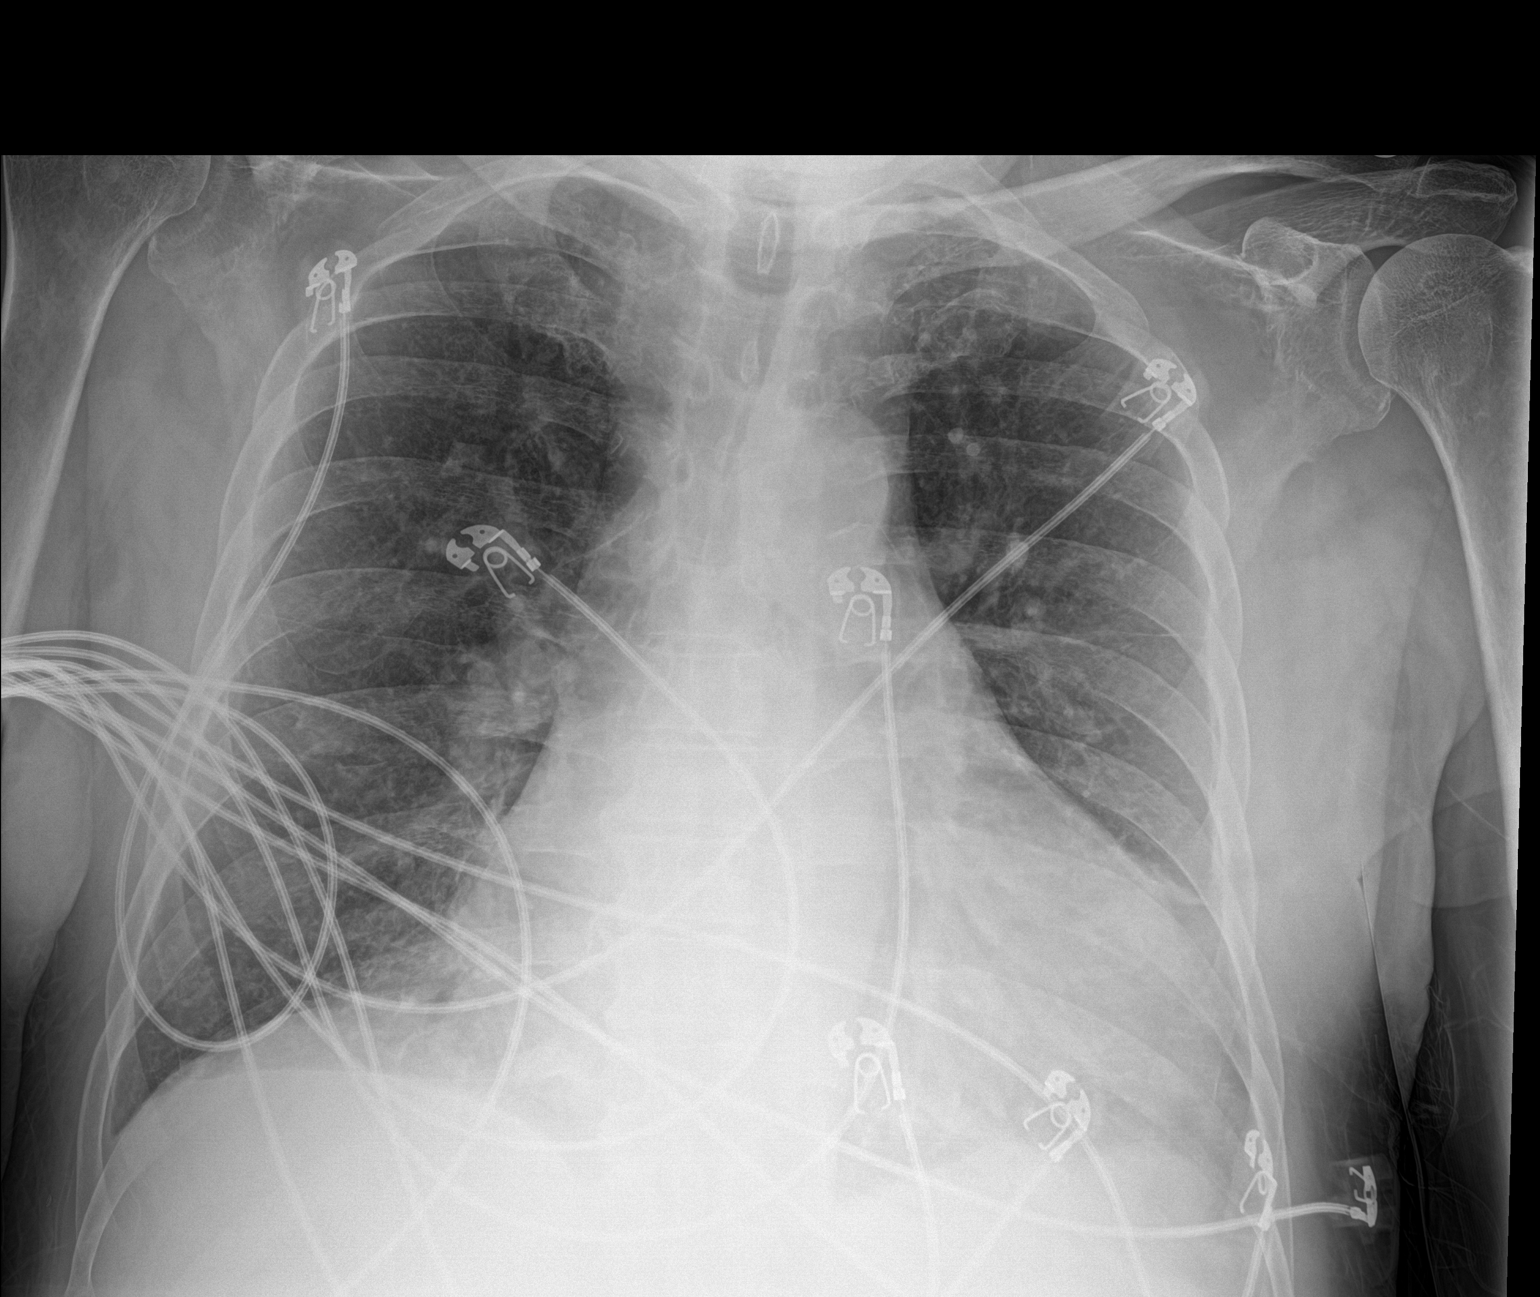

[1 of 1 positions shown; findings below may reference images not displayed]

FINDINGS: Stable cardiomegaly. No pneumothorax or pleural effusion is noted.
Lungs are clear. Bony thorax is unremarkable.
IMPRESSION: No active disease.

## 2021-10-11 IMAGING — MR MR CERVICAL SPINE W/O CM
5 of 6 series · 36 of 48 positions shown · non-contrast
Comparison: Cervical radiographs 09/04/2020, MRI 07/10/2020

EXAM:
MRI CERVICAL SPINE WITHOUT CONTRAST
TECHNIQUE: Multiplanar, multisequence MR imaging of the cervical spine was
performed. No intravenous contrast was administered.

[Series 6: T1 · sagittal · 3.0mm · 0.69mm/px · 6 of 15 slices shown (1 of 3)]
[im 1/15]
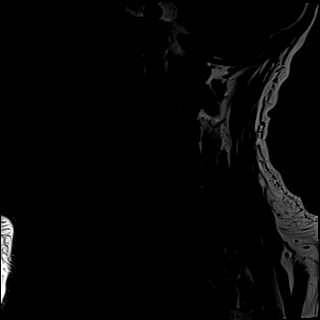
[im 3/15]
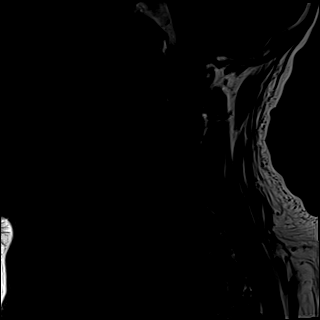
[im 6/15]
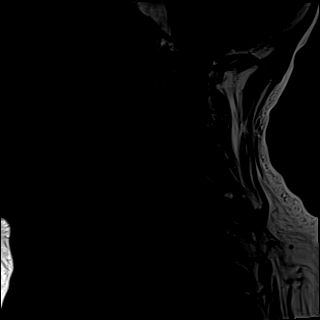
[im 9/15]
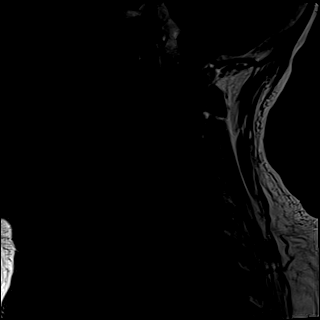
[im 12/15]
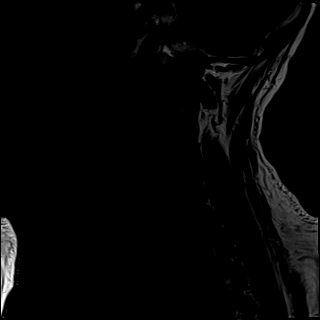
[im 15/15]
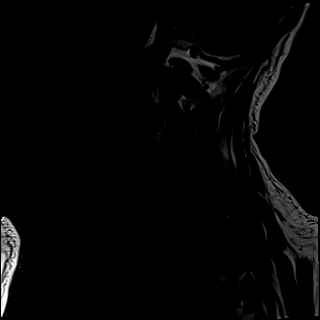

[Series 7: T2 · sagittal · 3.0mm · 0.69mm/px · 6 of 15 slices shown (1 of 2)]
[im 1/15]
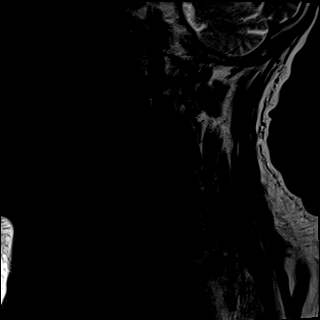
[im 3/15]
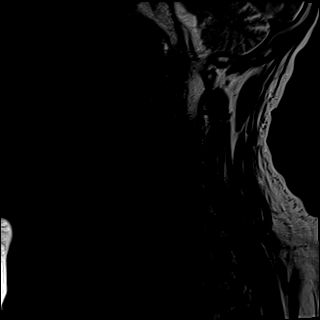
[im 6/15]
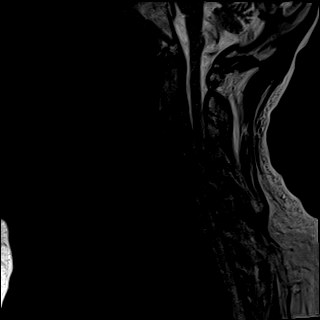
[im 9/15]
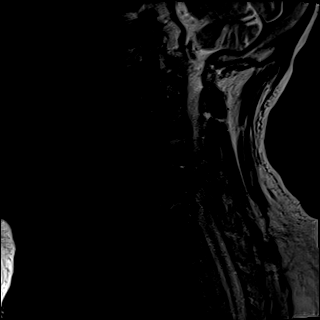
[im 12/15]
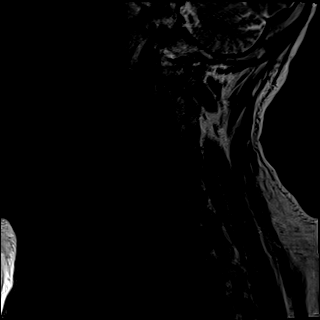
[im 15/15]
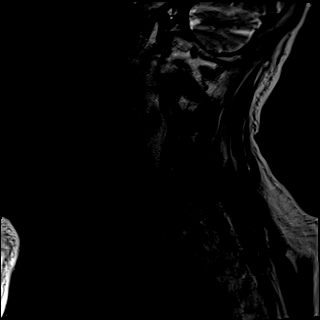

[Series 9: T1 · axial · 3.0mm · 0.35mm/px · z∈[-59,+37]mm · 9 of 30 slices shown (2 of 3)]
[im 1/30]
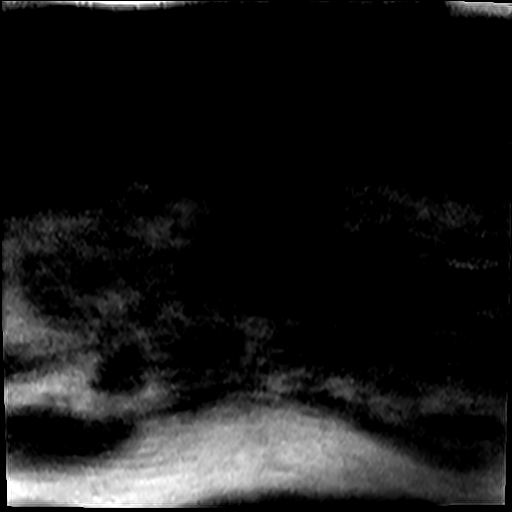
[im 6/30]
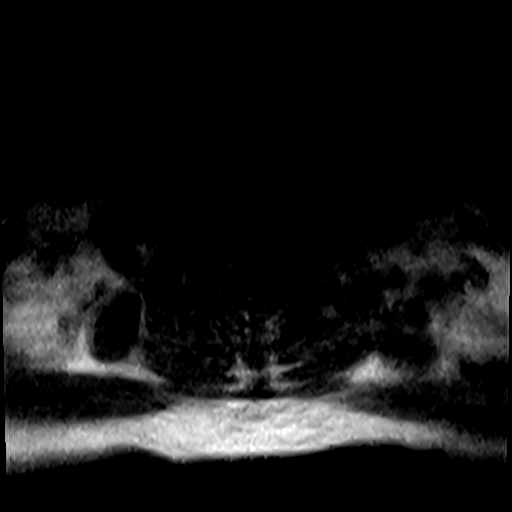
[im 8/30]
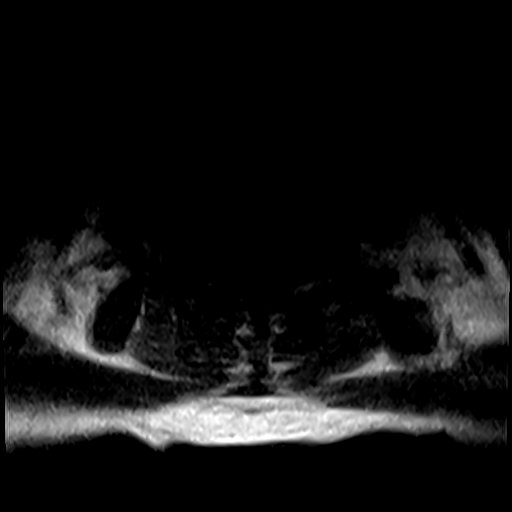
[im 14/30]
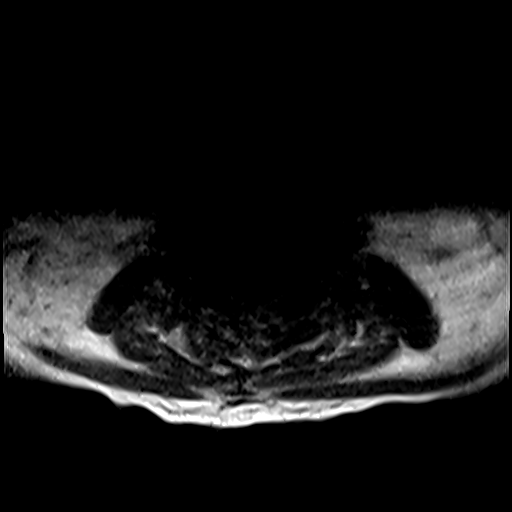
[im 16/30]
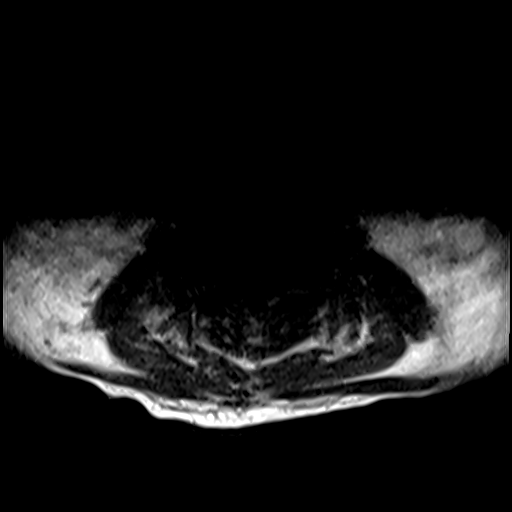
[im 22/30]
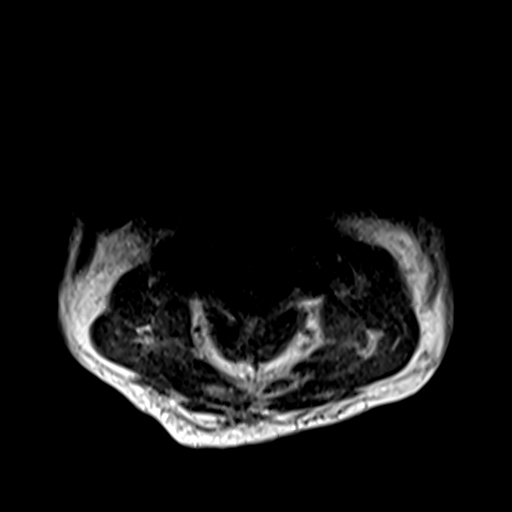
[im 24/30]
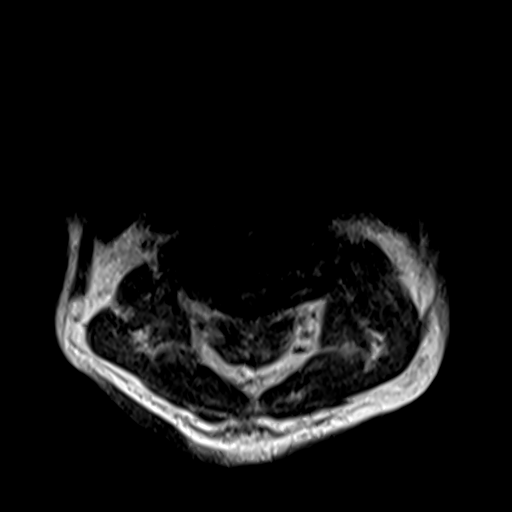
[im 27/30]
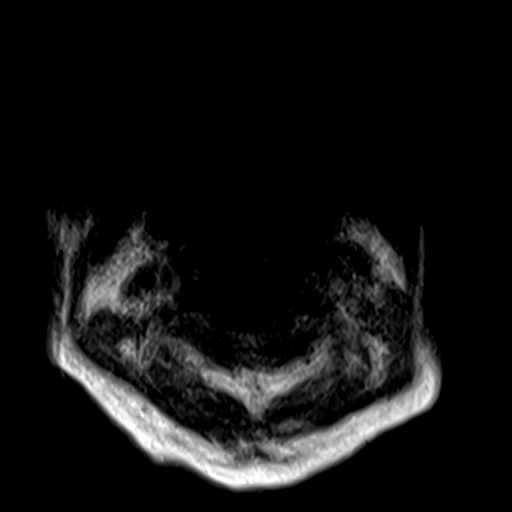
[im 30/30]
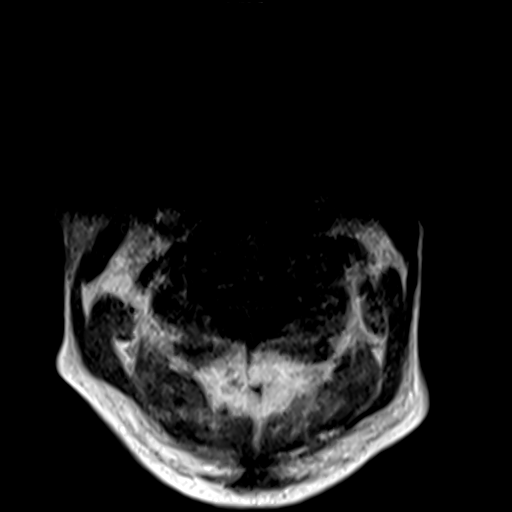

[Series 10: T2 · axial · 3.0mm · 0.70mm/px · z∈[-59,+37]mm · 9 of 30 slices shown (2 of 2)]
[im 1/30]
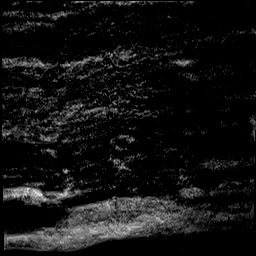
[im 6/30]
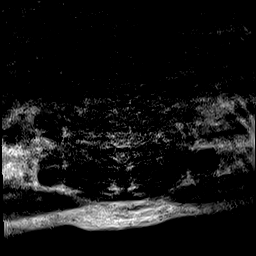
[im 8/30]
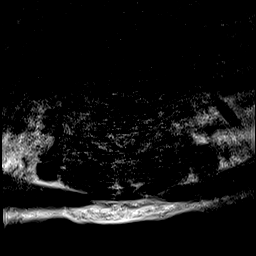
[im 14/30]
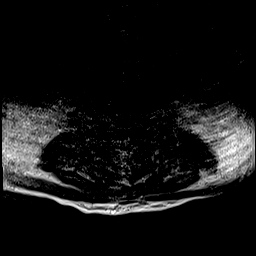
[im 16/30]
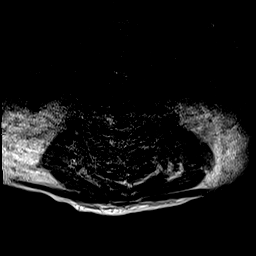
[im 22/30]
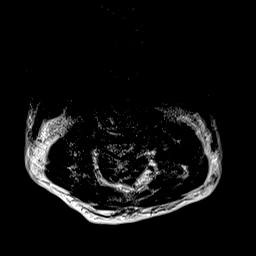
[im 24/30]
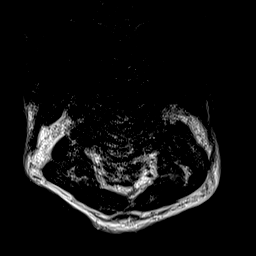
[im 27/30]
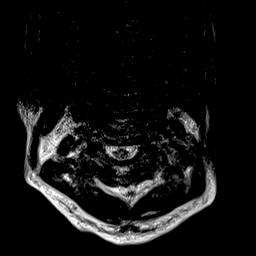
[im 30/30]
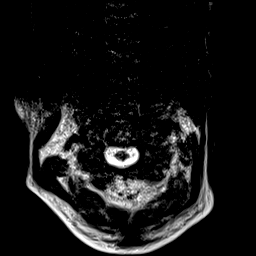

[Series 11: T1 · sagittal · 3.0mm · 0.69mm/px · 6 of 15 slices shown (3 of 3)]
[im 1/15]
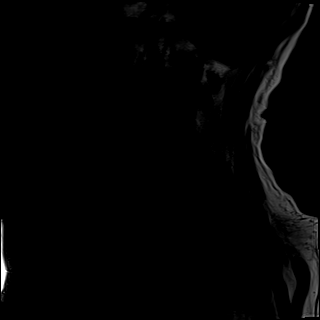
[im 3/15]
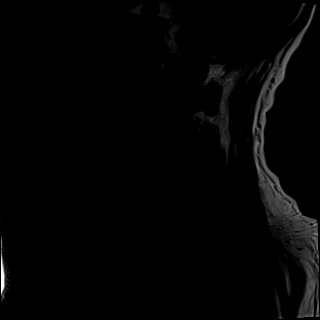
[im 6/15]
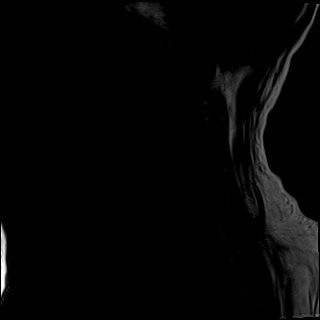
[im 9/15]
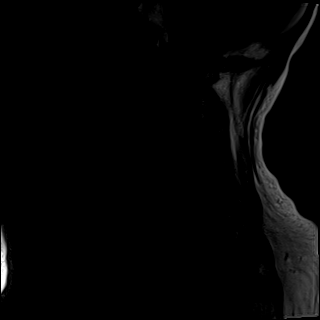
[im 12/15]
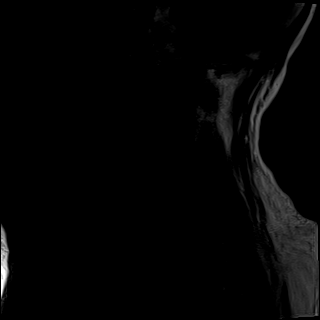
[im 15/15]
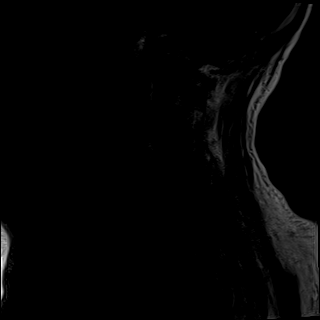

[36 of 48 positions shown; findings below may reference images not displayed]

FINDINGS: Severely limited examination secondary to patient intolerance and
motion. Within this limitation:

Alignment: Mild straightening of the normal cervical lordosis.
Otherwise, no evidence of substantial subluxation.

Vertebrae: Limited evaluation without obvious marrow signal
abnormality. Vertebral body heights are maintained. Postsurgical
changes of C4 through C6 ACDF.

Cord: No obvious cord signal abnormality; however, areas of
previously seen cord signal abnormality at C4-C5 and C5-C6 are
poorly characterized given motion.

Posterior Fossa, vertebral arteries, paraspinal tissues:
Nondiagnostic evaluation of the paraspinal tissues. The visualized
fossa is unremarkable.

Disc levels:

Mild to moderate stenosis is suspected C4-C5 and C5-C6. Otherwise,
no evidence of significant canal stenosis. Nondiagnostic evaluation
of the foramina secondary to motion.
IMPRESSION: Severely limited examination secondary to patient intolerance and
motion.

Postsurgical changes of C4 through C6 ACDF. Mild to moderate
stenosis is suspected at C4-C5 and C5-C6, improved from prior.
Nondiagnostic evaluation of the foramina.

## 2021-10-20 IMAGING — CR DG ABDOMEN ACUTE W/ 1V CHEST
4 series · 4 of 4 positions shown · non-contrast
Comparison: None.

CLINICAL DATA: Shortness of breath and abdominal

EXAM:
DG ABDOMEN ACUTE WITH 1 VIEW CHEST

[chest pa]
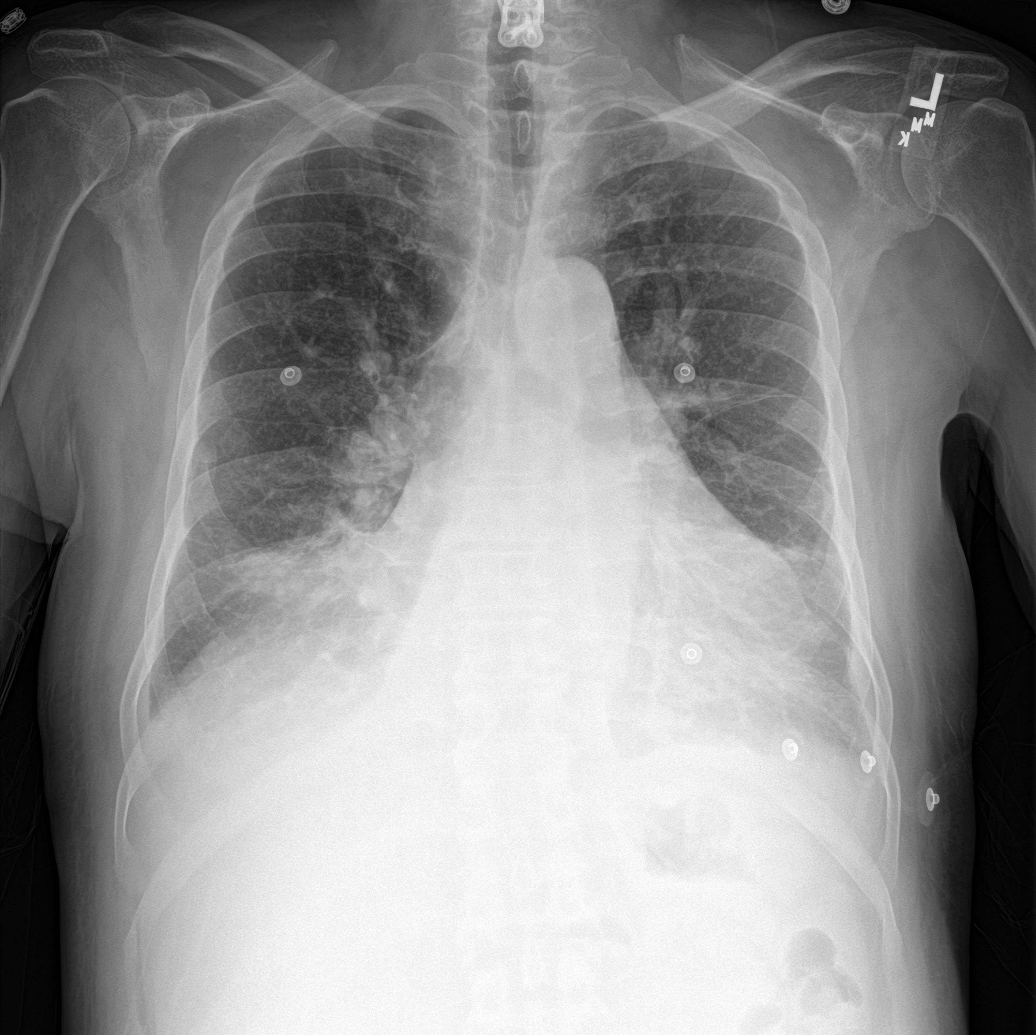

[abdomen erect]
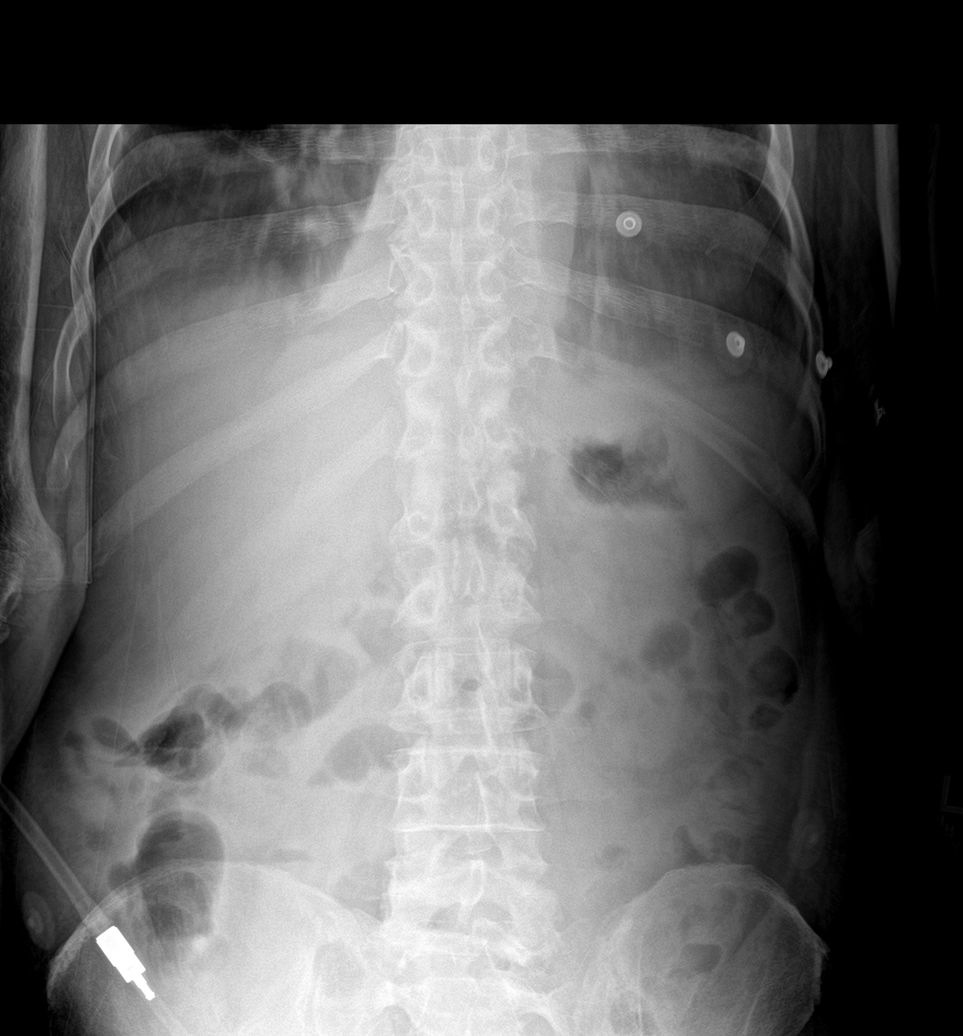

[abdomen supine (1 of 2)]
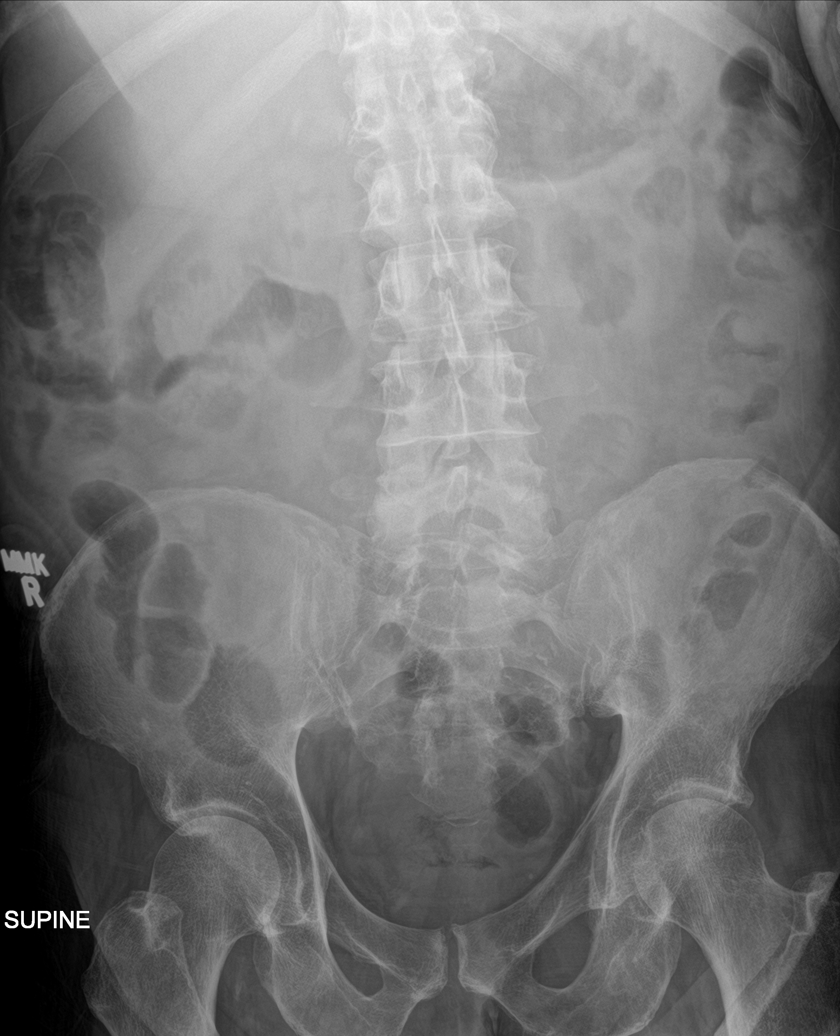

[abdomen supine (2 of 2)]
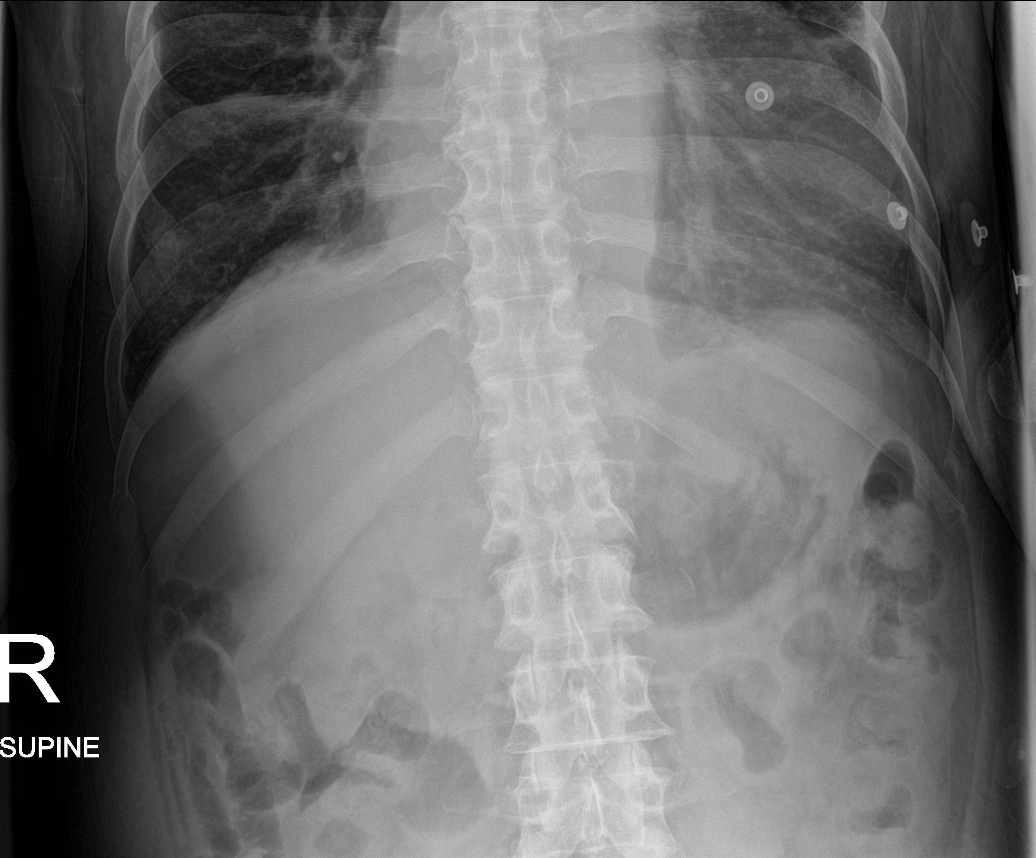

[4 of 4 positions shown; findings below may reference images not displayed]

FINDINGS: There is no evidence of dilated bowel loops or free intraperitoneal
air. No radiopaque calculi or other significant radiographic
abnormality is seen. There is mild cardiomegaly. Aortic knob
calcifications are seen. Patchy airspace opacity is seen at the
right lung base.
IMPRESSION: Negative abdominal radiographs. Patchy airspace opacities seen at
the right lung base which could be from atelectasis and/or
infectious etiology.

## 2021-10-21 IMAGING — CT CT ABD-PELV W/O CM
2 of 4 series · 16 of 46 positions shown, 18 images · non-contrast
Comparison: Ten days ago

CLINICAL DATA: Diarrhea and diffuse abdominal pain

EXAM:
CT ABDOMEN AND PELVIS WITHOUT CONTRAST
TECHNIQUE: Multidetector CT imaging of the abdomen and pelvis was performed
following the standard protocol without IV contrast.

[Series 3: abd/ pelvis 5.0 i30f 2 · axial · 0.80mm/px · z∈[-1041,-621]mm · 13 of 92 slices shown, 15 images]
[im 4/92  soft-tissue]
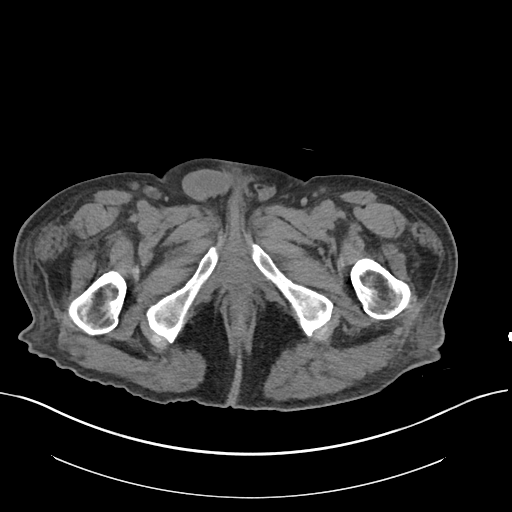
[im 4/92  bone]
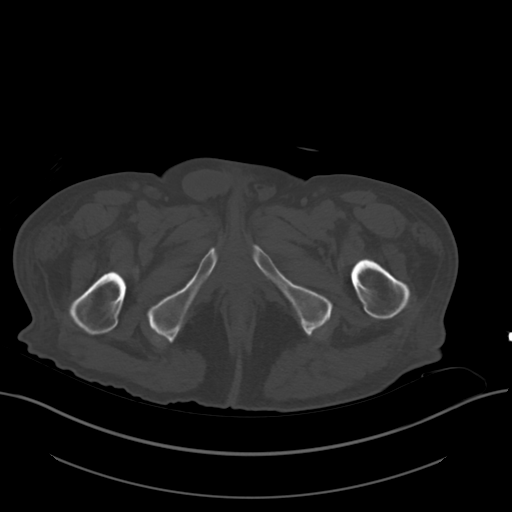
[im 12/92  soft-tissue]
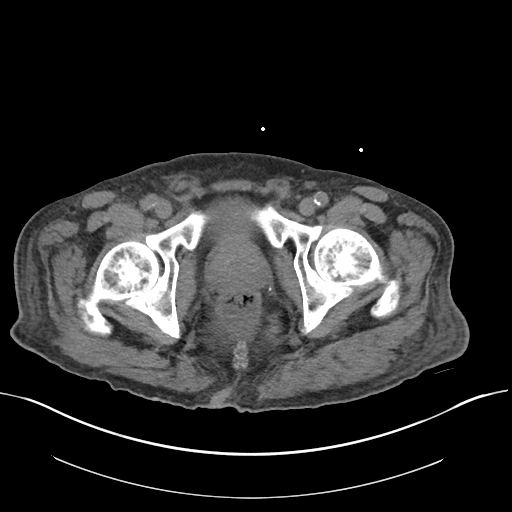
[im 19/92  soft-tissue]
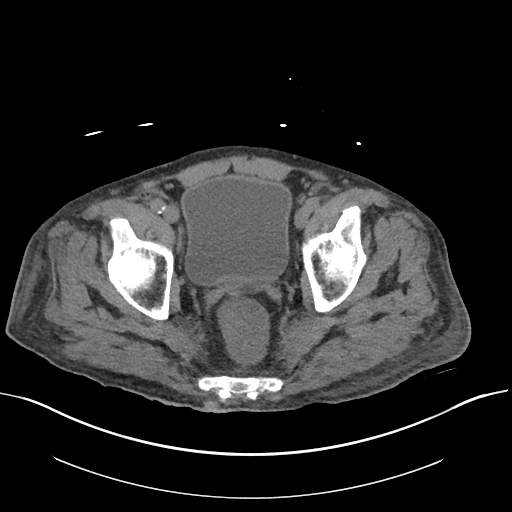
[im 27/92  soft-tissue]
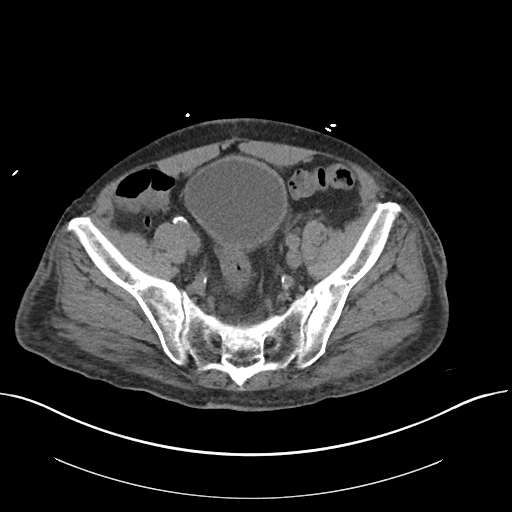
[im 31/92  soft-tissue]
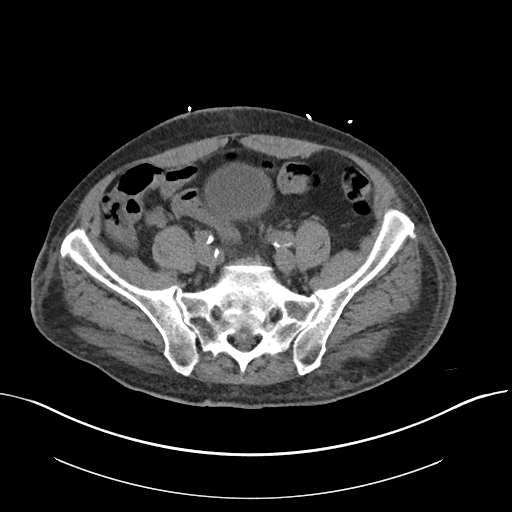
[im 38/92  soft-tissue]
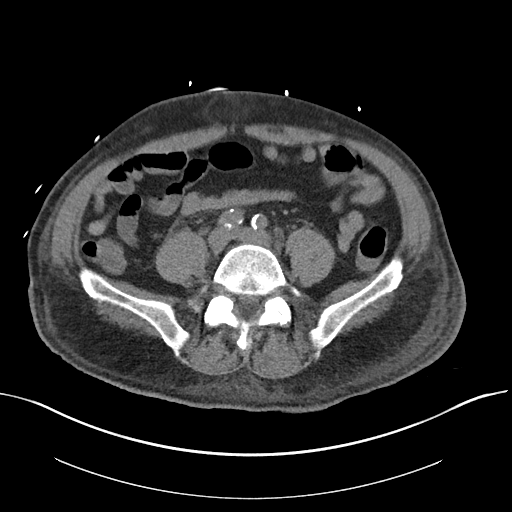
[im 46/92  soft-tissue]
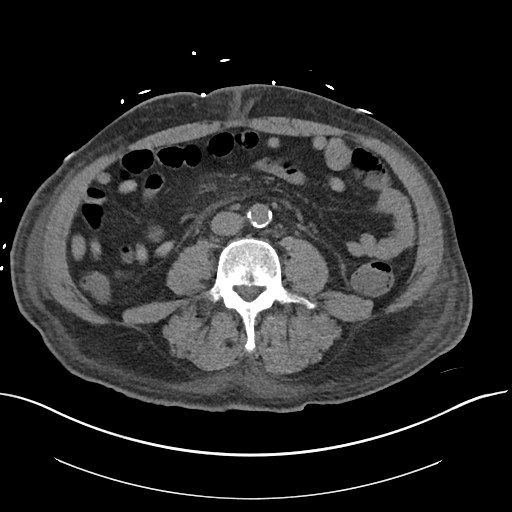
[im 54/92  soft-tissue]
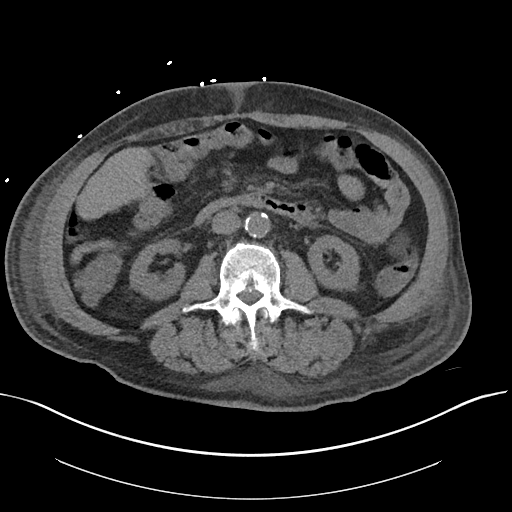
[im 61/92  soft-tissue]
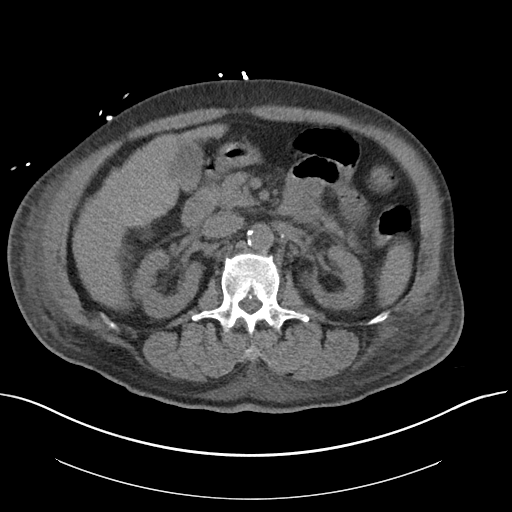
[im 61/92  bone]
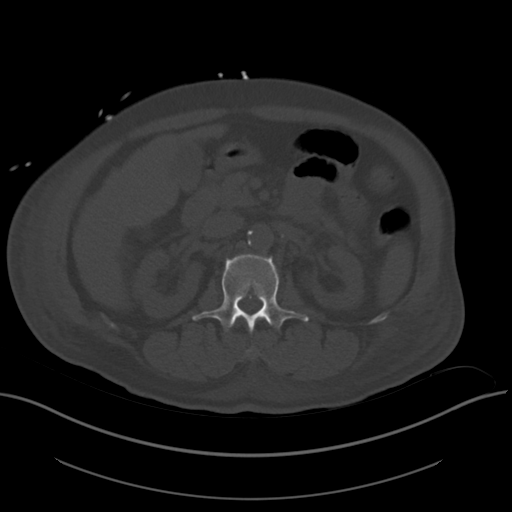
[im 65/92  soft-tissue]
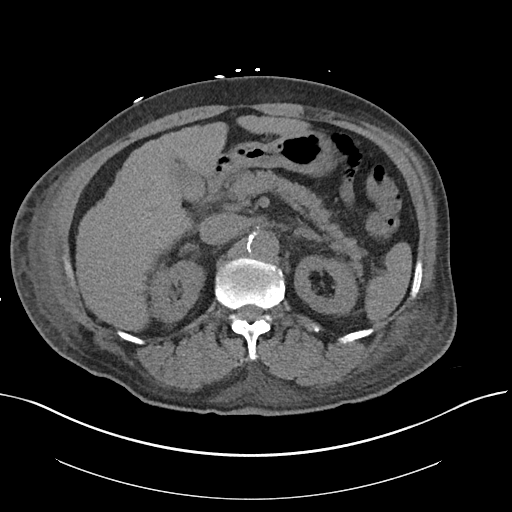
[im 73/92  soft-tissue]
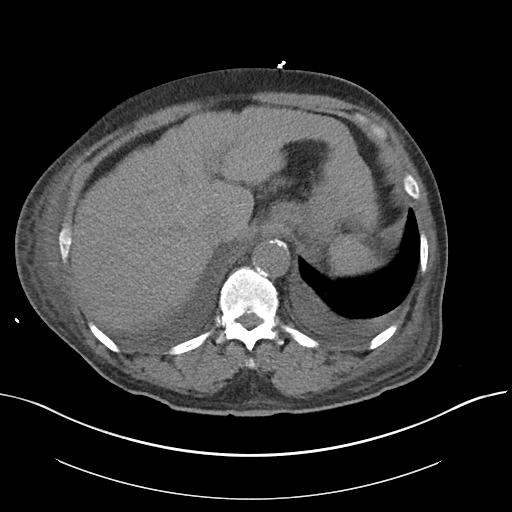
[im 80/92  soft-tissue]
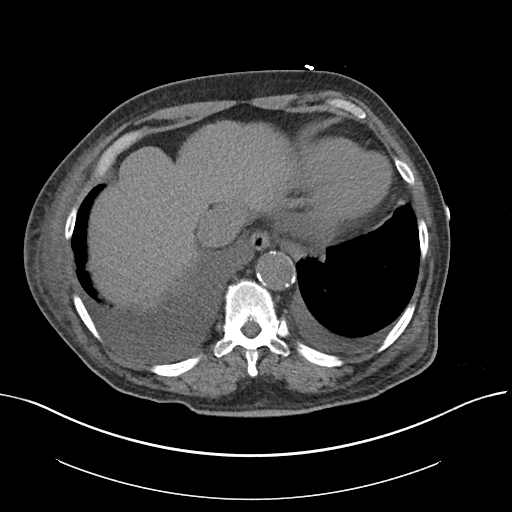
[im 88/92  soft-tissue]
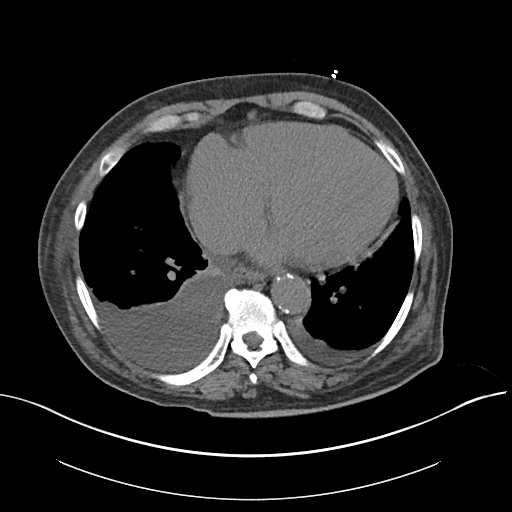

[Series 6: cor st · coronal · 0.71mm/px · 3 of 103 slices shown]
[im 35/103  soft-tissue]
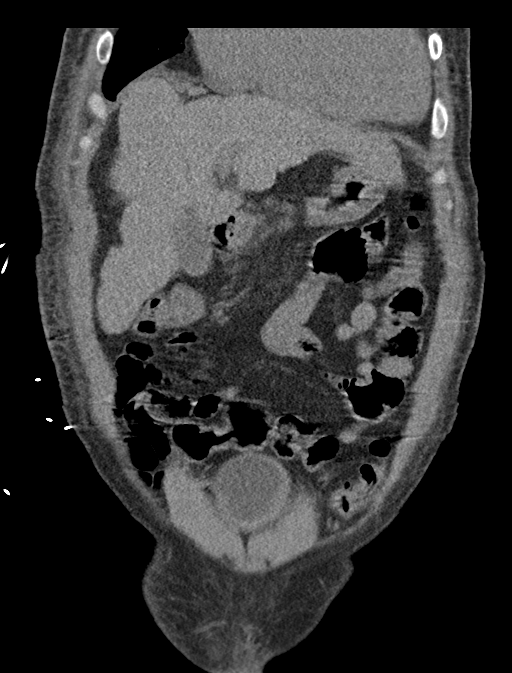
[im 46/103  soft-tissue]
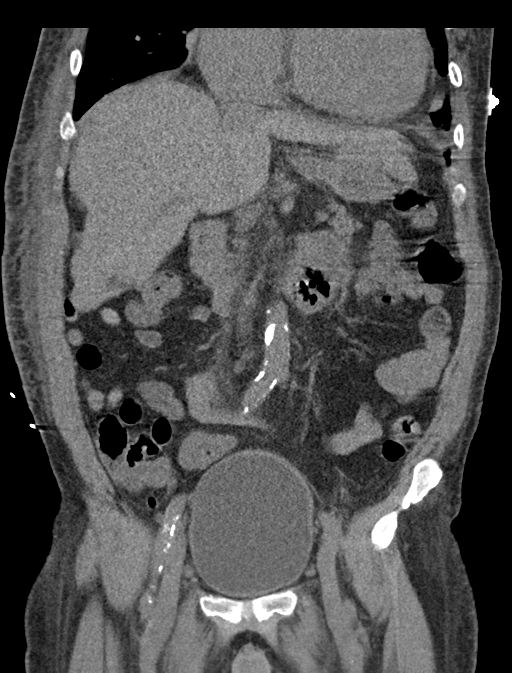
[im 57/103  soft-tissue]
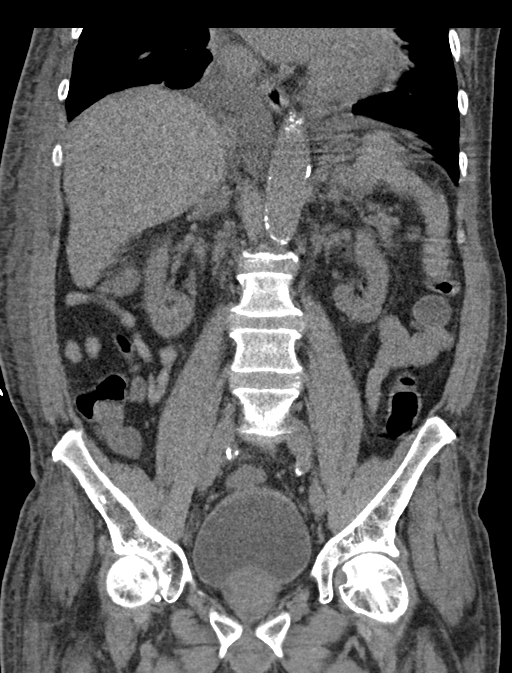

[16 of 46 positions shown; findings below may reference images not displayed]

FINDINGS: Lower chest: Moderate right pleural effusion and small left pleural
effusion, increased. Mild associated atelectasis. Chronic
cardiomegaly.

Hepatobiliary: Cirrhotic liver morphology. No gross mass
lesion.Dependent high-density gallbladder contents without calcified
stone or acute inflammation.

Pancreas: Generalized atrophy.

Spleen: Unremarkable.

Adrenals/Urinary Tract: Negative adrenals. No hydronephrosis or
stone. Unremarkable bladder for the degree of distension.

Stomach/Bowel: No obstruction. No appendicitis or other visible
inflammation.

Vascular/Lymphatic: No acute vascular abnormality. Diffuse
atheromatous calcification. No mass or adenopathy.

Reproductive:Chronic prostate enlargement

Other: No ascites or pneumoperitoneum.  Anasarca

Musculoskeletal: No acute abnormalities.
IMPRESSION: 1. Moderate right and small left pleural effusion which has
increased from 10 days ago. There is also progressive anasarca.
2. Cirrhosis.

## 2021-10-27 IMAGING — CT CT ANGIO CHEST-ABD-PELV FOR DISSECTION W/ AND WO/W CM
1 of 6 series · 5 of 46 positions shown, 6 images · IV contrast (OMNI 350)
Comparison: CT angiography 06/21/2020, chest radiograph 10/24/2020,
CT abdomen and pelvis 10/18/2020
COMPARISON: CT angiography 06/21/2020, chest radiograph 10/24/2020,
CT abdomen and pelvis 10/18/2020

Addendum:
CLINICAL DATA: Chest and abdominal pain, concern for dissection,
history of substance abuse, advanced cardiac history, CKD and
hepatitis C

EXAM:
CT ANGIOGRAPHY CHEST, ABDOMEN AND PELVIS
TECHNIQUE: Non-contrast CT of the chest was initially obtained.

[Series 7: dissection 2mm · axial · 0.84mm/px · z∈[+813,+1345]mm · 5 of 346 slices shown, 6 images]
[im 40/346  soft-tissue]
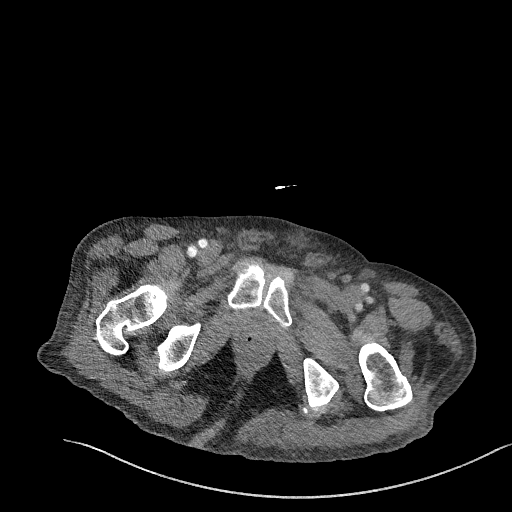
[im 40/346  bone]
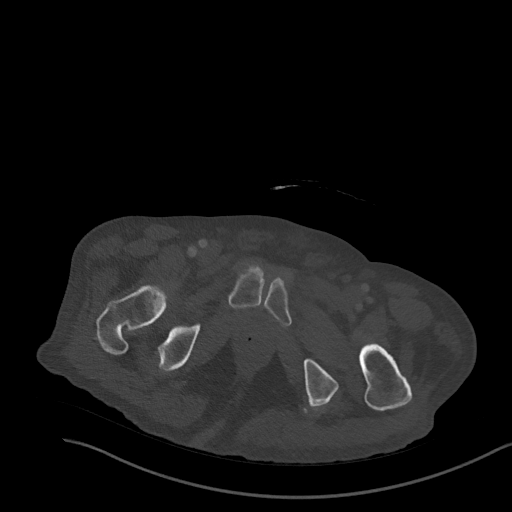
[im 107/346  soft-tissue]
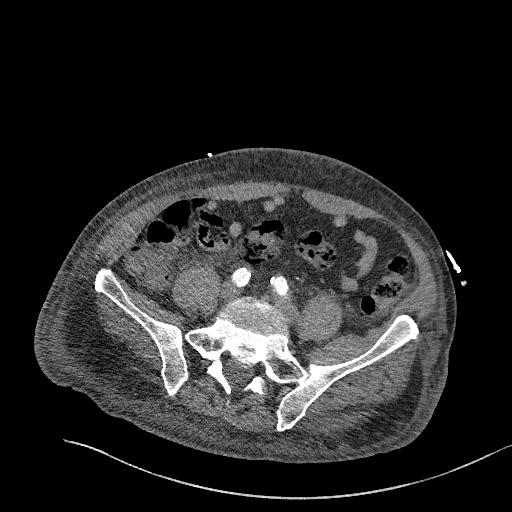
[im 173/346  soft-tissue]
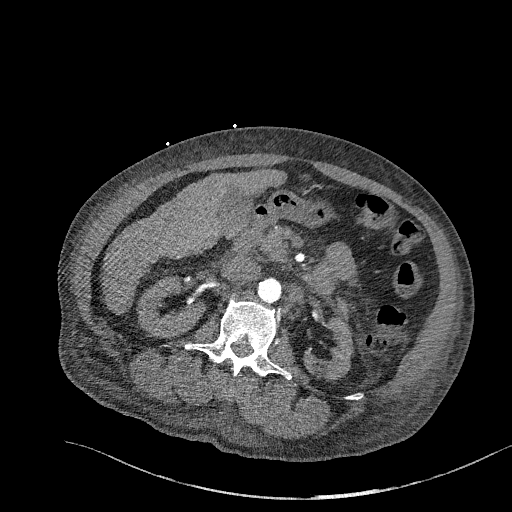
[im 239/346  soft-tissue]
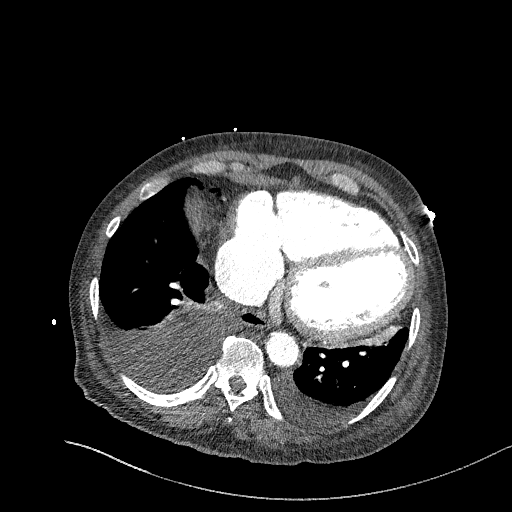
[im 306/346  soft-tissue]
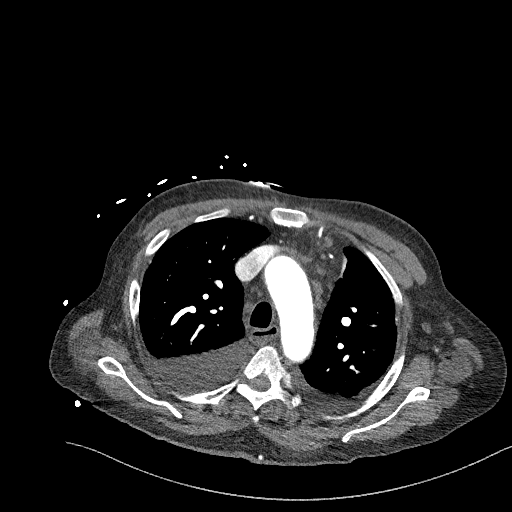

[5 of 46 positions shown; findings below may reference images not displayed]

Multidetector CT imaging through the chest, abdomen and pelvis was
performed using the standard protocol during bolus administration of
intravenous contrast. Multiplanar reconstructed images and MIPs were
obtained and reviewed to evaluate the vascular anatomy.

CONTRAST:  100mL OMNIPAQUE IOHEXOL 350 MG/ML SOLN
FINDINGS: CTA CHEST FINDINGS

Cardiovascular: Noncontrast CT of the chest was performed initially.
There is a normal caliber aorta with scattered atherosclerotic
calcifications. No abnormal hyperdense mural thickening or plaque
displacement is seen to suggest intramural hematoma. Postcontrast
administration, there is satisfactory preferential opacification of
the thoracic aorta. No acute luminal abnormality of the imaged
aorta. No periaortic stranding or hemorrhage. Normal 3 vessel
branching of the aortic arch. Proximal great vessels are mildly
calcified but otherwise unremarkable without acute luminal
abnormality. Central pulmonary arteries are normal caliber. No large
central or lobar filling defects present within the limitations this
non tailored evaluation of the pulmonary arteries. There is mild
cardiomegaly with predominantly biatrial enlargement and
three-vessel coronary artery atherosclerosis. No pericardial
effusion. Small volume of fluid in the pericardial recesses is upper
limits of physiologic normal.

Mediastinum/Nodes: Minimal retrosternal thickening along the left
posterolateral margin of the sternomanubrial joint. No other
mediastinal fluid or gas. Normal thyroid gland and thoracic inlet.
No acute abnormality of the trachea or esophagus. No worrisome
mediastinal, hilar or axillary adenopathy. Scattered subcentimeter
low-attenuation mediastinal nodes may be edematous or reactive.

Lungs/Pleura: Redistribution of pulmonary vascularity with vascular
cuffing and interlobular septal thickening most pronounced towards
the lung apices and bilateral bases suggesting at least mild
interstitial edema. Moderate right and small left pleural effusions
with adjacent areas of passive atelectasis. Additional areas of
bandlike opacity likely reflecting further subsegmental atelectasis
or scarring. Some more dependent ground-glass favors additional
atelectasis as well. No focal consolidative opacity. No pneumothorax

Musculoskeletal: Mild dextrocurvature of the spine, possibly
positional. There is fairly focal phlegmonous change along the
anterior chest wall near the sternomanubrial margin albeit without
organized rim enhancing collection or abscess nor adjacent erosive
or destructive changes of the sternum joint at this time. Some
multilevel discogenic changes are present without features of acute
osteomyelitis or discitis. Some asymmetric degenerative changes of
the medial head clavicles are similar to prior. Additional mild
degenerative changes in both shoulders as imaged.

Review of the MIP images confirms the above findings.

CTA ABDOMEN AND PELVIS FINDINGS

VASCULAR

Aorta: Atherosclerotic plaque within the normal caliber aorta. No
aneurysm or ectasia. No acute luminal abnormality. No periaortic
stranding or hemorrhage.

Celiac: Mild ostial plaque narrowing with some poststenotic
dilatation of the proximal celiac to 12 mm in diameter (7/150),
unchanged from comparison. No other significant narrowing,
dilatation or ectasia is seen.

SMA: Mild ostial plaque narrowing without aneurysm, 10 Melta, stenosis
or occlusion. No acute luminal abnormality is seen.

Renals: Single renal arteries bilaterally. Ostial plaque bilaterally
with mild narrowing, right slightly greater than left. No other
significant stenosis or occlusion. No acute luminal abnormality. No
aneurysm or ectasia or features of fibromuscular dysplasia.

IMA: Moderate ostial plaque narrowing. Otherwise normally opacified.
Normal branching without acute luminal abnormality or occlusion.

Inflow: Atherosclerotic calcification in the common, internal
external iliac artery branches without stenosis, occlusion or acute
luminal abnormality. Proximal outflow vessels including the common
superficial and deep femoral arteries are mildly calcified but
without significant stenosis, occlusion or acute luminal
abnormality.

Veins: Reflux of contrast into the IVC and hepatic veins. No other
major venous abnormality within limitations of this arterial phase
exam.

Review of the MIP images confirms the above findings.

NON-VASCULAR

Hepatobiliary: Hepatomegaly with a nodular liver surface contour. No
concerning focal liver lesion. Gallbladder containing some layering
intermediate attenuation may reflect biliary sludge or Ninel. No
pericholecystic fluid or inflammation. No intraductal gallstones. No
biliary ductal dilatation.

Pancreas: No pancreatic ductal dilatation or surrounding
inflammatory changes.

Spleen: Normal in size. No concerning splenic lesions.

Adrenals/Urinary Tract: Normal adrenal glands. Stable mild symmetric
bilateral perinephric stranding, a nonspecific finding particularly
given some circumferential bladder wall thickening. Fluid
attenuation cyst present in the lower pole left kidney is similar to
comparison. No concerning renal mass. Urolithiasis or
hydronephrosis. No visible bladder calculi or debris.

Stomach/Bowel: Distal esophagus and stomach are unremarkable. There
are some mild edematous retroperitoneal changes centered upon the
second and third portions of the duodenal sweep as across the
midline abdomen. Wall thickening is difficult to assess given
underdistention and lack of enteric contrast media. No other small
bowel thickening or stranding is seen. A normal appendix is
visualized. No colonic dilatation or wall thickening. Rectal stool
ball without significant wall thickening. Presacral fat stranding is
nonspecific given additional edematous features elsewhere.

Lymphatic: No suspicious or enlarged lymph nodes in the included
lymphatic chains.

Reproductive: Prostatomegaly. Prostate seminal vesicles are
otherwise unremarkable.

Other: There is diffuse body wall edema. There are nonspecific
retroperitoneal edematous changes, which is questionably centered
upon the duodenal sweep though is distributed within the pararenal
spaces and more inferiorly as well. Small volume of free
intraperitoneal fluid is also present layering within the pericolic
gutters and presacral space as well. No free air. No bowel
containing hernias.

Musculoskeletal: No acute osseous abnormality or suspicious osseous
lesion. Multilevel degenerative changes are present in the imaged
portions of the spine. Mild degenerative changes in the hips and
pelvis.

Review of the MIP images confirms the above findings.
IMPRESSION: Vascular:

1. No evidence of acute aortic syndrome.
2. Features of congestive heart failure with cardiomegaly and
predominantly biatrial enlargement and three-vessel coronary artery
atherosclerosis. Reflux of contrast into the IVC and hepatic veins
may suggest right-sided heart elevated right heart pressures in the
setting of fluid overload.
3.  Aortic Atherosclerosis ([id number]-F53.3).
4. Multilevel mild splanchnic and renal artery ostial narrowing with
more mild narrowing at the IMA origin as well.
5. Stable poststenotic dilatation of the proximal celiac artery.

Nonvascular:

1. Stigmata of cirrhosis with a heterogeneous, nodular liver.
2. Additional features of anasarca with diffuse body wall edema,
small volume ascites, and mesenteric edematous changes.
3. Fairly focal phlegmonous change along the anterior chest wall
near the sternomanubrial margin with mild retrosternal stranding as
well. No organized rim enhancing collection or abscess nor
destructive changes of the sternomanubrial joint at this time.
Findings are nonspecific though could reflect early
infection/sternomanubrial osteomyelitis particularly setting of
substance use.
4. Slightly more focal retroperitoneal edematous changes appear
centered upon the duodenal sweep though may be redistributed. Could
correlate for clinical features of duodenitis.
5. Circumferential bladder wall thickening with prostatomegaly,
possibly related to chronic outlet obstruction. Recommend assessment
of urinary symptoms with urinalysis as clinically warranted to
exclude a cystitis.
6. Gallbladder containing some layering intermediate attenuation may
reflect biliary sludge or Ninel but without convincing CT features of
acute cholecystitis.

ADDENDUM:
Results and case discussion with the ordering provider via telephone
on 10/24/2020 at [DATE] to provider QUARM VOLANTE , who
verbally acknowledged these results.

*** End of Addendum ***
Multidetector CT imaging through the chest, abdomen and pelvis was
performed using the standard protocol during bolus administration of
intravenous contrast. Multiplanar reconstructed images and MIPs were
obtained and reviewed to evaluate the vascular anatomy.

CONTRAST:  100mL OMNIPAQUE IOHEXOL 350 MG/ML SOLN
FINDINGS: CTA CHEST FINDINGS

Cardiovascular: Noncontrast CT of the chest was performed initially.
There is a normal caliber aorta with scattered atherosclerotic
calcifications. No abnormal hyperdense mural thickening or plaque
displacement is seen to suggest intramural hematoma. Postcontrast
administration, there is satisfactory preferential opacification of
the thoracic aorta. No acute luminal abnormality of the imaged
aorta. No periaortic stranding or hemorrhage. Normal 3 vessel
branching of the aortic arch. Proximal great vessels are mildly
calcified but otherwise unremarkable without acute luminal
abnormality. Central pulmonary arteries are normal caliber. No large
central or lobar filling defects present within the limitations this
non tailored evaluation of the pulmonary arteries. There is mild
cardiomegaly with predominantly biatrial enlargement and
three-vessel coronary artery atherosclerosis. No pericardial
effusion. Small volume of fluid in the pericardial recesses is upper
limits of physiologic normal.

Mediastinum/Nodes: Minimal retrosternal thickening along the left
posterolateral margin of the sternomanubrial joint. No other
mediastinal fluid or gas. Normal thyroid gland and thoracic inlet.
No acute abnormality of the trachea or esophagus. No worrisome
mediastinal, hilar or axillary adenopathy. Scattered subcentimeter
low-attenuation mediastinal nodes may be edematous or reactive.

Lungs/Pleura: Redistribution of pulmonary vascularity with vascular
cuffing and interlobular septal thickening most pronounced towards
the lung apices and bilateral bases suggesting at least mild
interstitial edema. Moderate right and small left pleural effusions
with adjacent areas of passive atelectasis. Additional areas of
bandlike opacity likely reflecting further subsegmental atelectasis
or scarring. Some more dependent ground-glass favors additional
atelectasis as well. No focal consolidative opacity. No pneumothorax

Musculoskeletal: Mild dextrocurvature of the spine, possibly
positional. There is fairly focal phlegmonous change along the
anterior chest wall near the sternomanubrial margin albeit without
organized rim enhancing collection or abscess nor adjacent erosive
or destructive changes of the sternum joint at this time. Some
multilevel discogenic changes are present without features of acute
osteomyelitis or discitis. Some asymmetric degenerative changes of
the medial head clavicles are similar to prior. Additional mild
degenerative changes in both shoulders as imaged.

Review of the MIP images confirms the above findings.

CTA ABDOMEN AND PELVIS FINDINGS

VASCULAR

Aorta: Atherosclerotic plaque within the normal caliber aorta. No
aneurysm or ectasia. No acute luminal abnormality. No periaortic
stranding or hemorrhage.

Celiac: Mild ostial plaque narrowing with some poststenotic
dilatation of the proximal celiac to 12 mm in diameter (7/150),
unchanged from comparison. No other significant narrowing,
dilatation or ectasia is seen.

SMA: Mild ostial plaque narrowing without aneurysm, 10 Melta, stenosis
or occlusion. No acute luminal abnormality is seen.

Renals: Single renal arteries bilaterally. Ostial plaque bilaterally
with mild narrowing, right slightly greater than left. No other
significant stenosis or occlusion. No acute luminal abnormality. No
aneurysm or ectasia or features of fibromuscular dysplasia.

IMA: Moderate ostial plaque narrowing. Otherwise normally opacified.
Normal branching without acute luminal abnormality or occlusion.

Inflow: Atherosclerotic calcification in the common, internal
external iliac artery branches without stenosis, occlusion or acute
luminal abnormality. Proximal outflow vessels including the common
superficial and deep femoral arteries are mildly calcified but
without significant stenosis, occlusion or acute luminal
abnormality.

Veins: Reflux of contrast into the IVC and hepatic veins. No other
major venous abnormality within limitations of this arterial phase
exam.

Review of the MIP images confirms the above findings.

NON-VASCULAR

Hepatobiliary: Hepatomegaly with a nodular liver surface contour. No
concerning focal liver lesion. Gallbladder containing some layering
intermediate attenuation may reflect biliary sludge or Ninel. No
pericholecystic fluid or inflammation. No intraductal gallstones. No
biliary ductal dilatation.

Pancreas: No pancreatic ductal dilatation or surrounding
inflammatory changes.

Spleen: Normal in size. No concerning splenic lesions.

Adrenals/Urinary Tract: Normal adrenal glands. Stable mild symmetric
bilateral perinephric stranding, a nonspecific finding particularly
given some circumferential bladder wall thickening. Fluid
attenuation cyst present in the lower pole left kidney is similar to
comparison. No concerning renal mass. Urolithiasis or
hydronephrosis. No visible bladder calculi or debris.

Stomach/Bowel: Distal esophagus and stomach are unremarkable. There
are some mild edematous retroperitoneal changes centered upon the
second and third portions of the duodenal sweep as across the
midline abdomen. Wall thickening is difficult to assess given
underdistention and lack of enteric contrast media. No other small
bowel thickening or stranding is seen. A normal appendix is
visualized. No colonic dilatation or wall thickening. Rectal stool
ball without significant wall thickening. Presacral fat stranding is
nonspecific given additional edematous features elsewhere.

Lymphatic: No suspicious or enlarged lymph nodes in the included
lymphatic chains.

Reproductive: Prostatomegaly. Prostate seminal vesicles are
otherwise unremarkable.

Other: There is diffuse body wall edema. There are nonspecific
retroperitoneal edematous changes, which is questionably centered
upon the duodenal sweep though is distributed within the pararenal
spaces and more inferiorly as well. Small volume of free
intraperitoneal fluid is also present layering within the pericolic
gutters and presacral space as well. No free air. No bowel
containing hernias.

Musculoskeletal: No acute osseous abnormality or suspicious osseous
lesion. Multilevel degenerative changes are present in the imaged
portions of the spine. Mild degenerative changes in the hips and
pelvis.

Review of the MIP images confirms the above findings.
IMPRESSION: Vascular:

1. No evidence of acute aortic syndrome.
2. Features of congestive heart failure with cardiomegaly and
predominantly biatrial enlargement and three-vessel coronary artery
atherosclerosis. Reflux of contrast into the IVC and hepatic veins
may suggest right-sided heart elevated right heart pressures in the
setting of fluid overload.
3.  Aortic Atherosclerosis (3S5CX-F53.3).
4. Multilevel mild splanchnic and renal artery ostial narrowing with
more mild narrowing at the IMA origin as well.
5. Stable poststenotic dilatation of the proximal celiac artery.

Nonvascular:

1. Stigmata of cirrhosis with a heterogeneous, nodular liver.
2. Additional features of anasarca with diffuse body wall edema,
small volume ascites, and mesenteric edematous changes.
3. Fairly focal phlegmonous change along the anterior chest wall
near the sternomanubrial margin with mild retrosternal stranding as
well. No organized rim enhancing collection or abscess nor
destructive changes of the sternomanubrial joint at this time.
Findings are nonspecific though could reflect early
infection/sternomanubrial osteomyelitis particularly setting of
substance use.
4. Slightly more focal retroperitoneal edematous changes appear
centered upon the duodenal sweep though may be redistributed. Could
correlate for clinical features of duodenitis.
5. Circumferential bladder wall thickening with prostatomegaly,
possibly related to chronic outlet obstruction. Recommend assessment
of urinary symptoms with urinalysis as clinically warranted to
exclude a cystitis.
6. Gallbladder containing some layering intermediate attenuation may
reflect biliary sludge or Ninel but without convincing CT features of
acute cholecystitis.

## 2021-10-27 IMAGING — CR DG CHEST 2V
2 series · 2 of 2 positions shown · non-contrast
Comparison: 10/08/2020

CLINICAL DATA: Chest pain

EXAM:
CHEST - 2 VIEW

[chest lat]
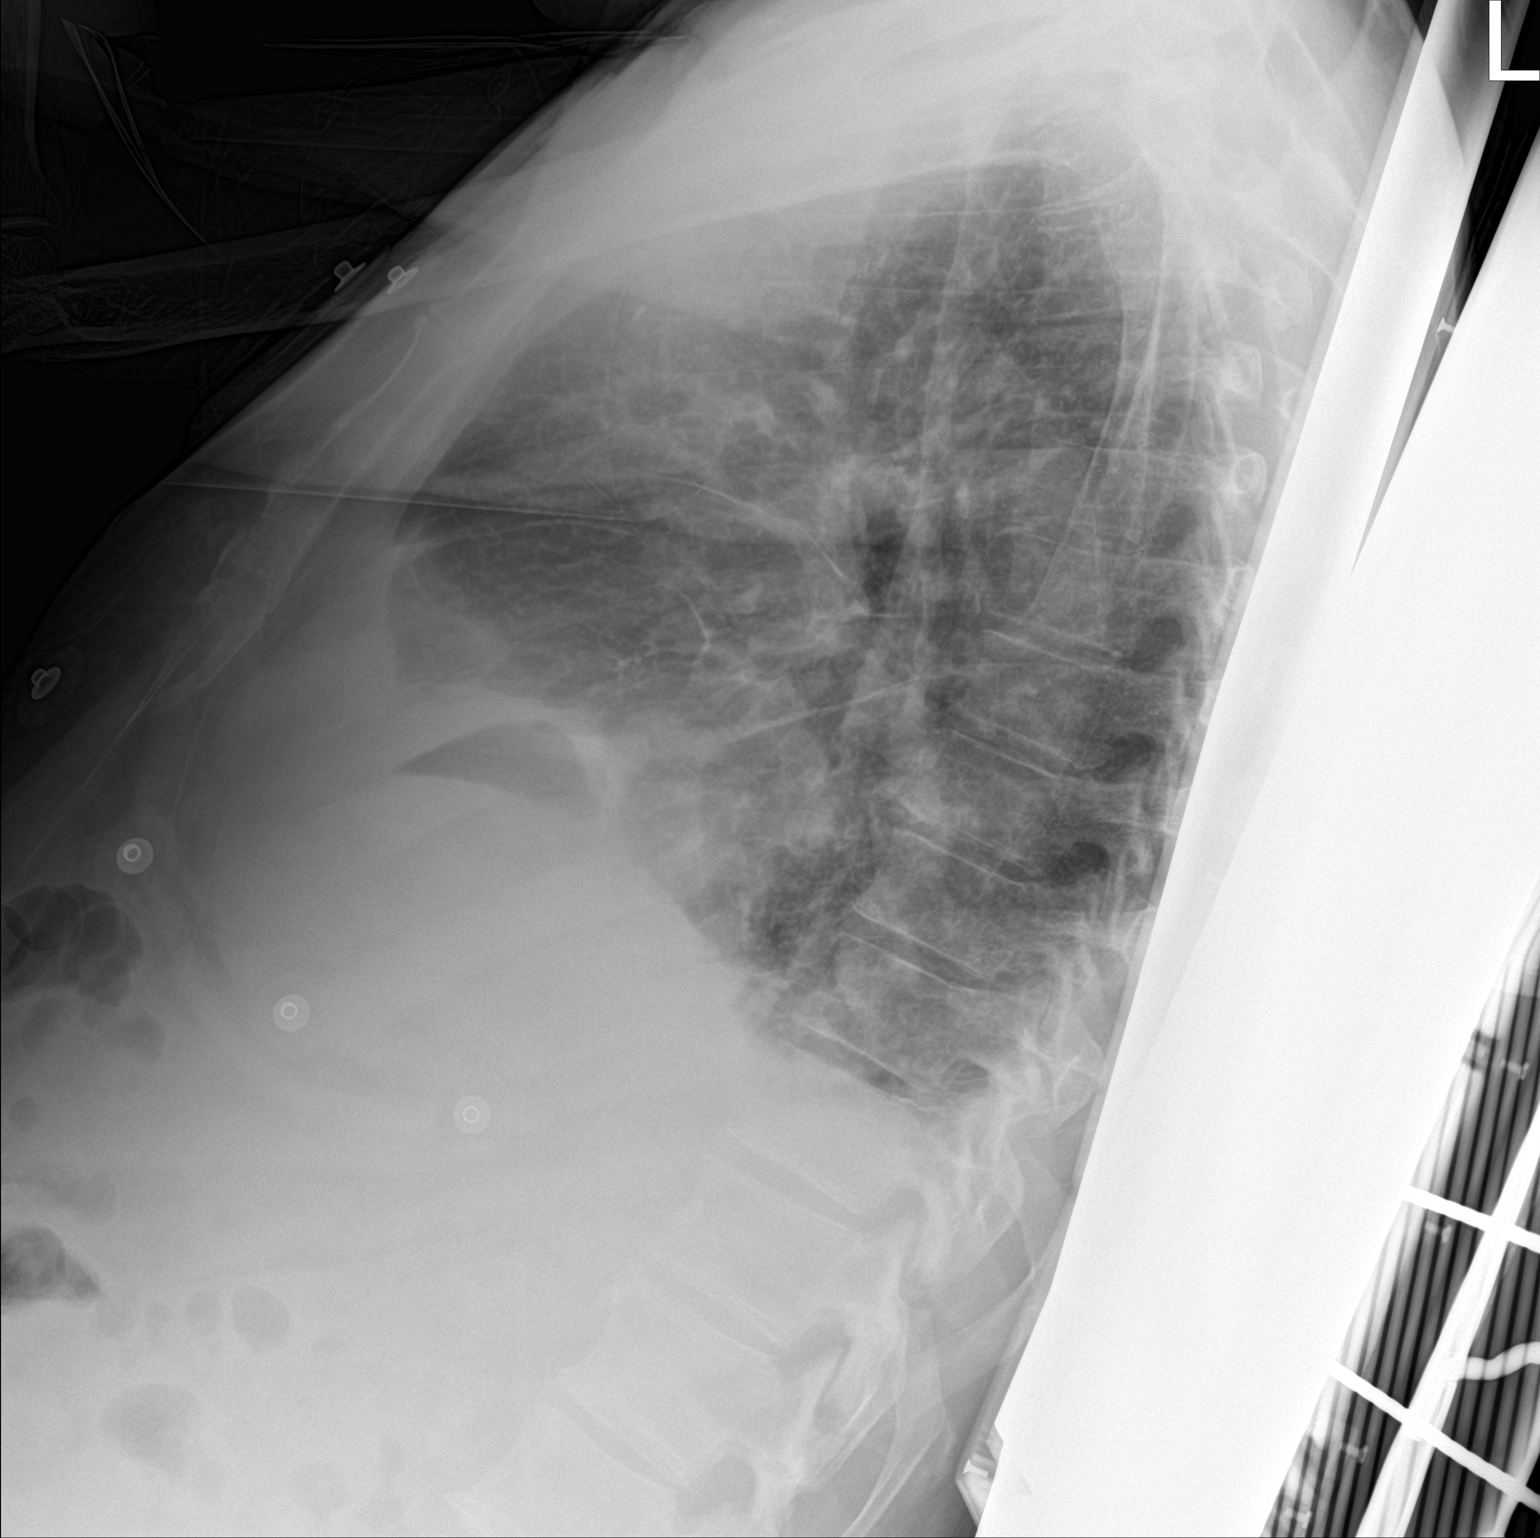

[chest ap]
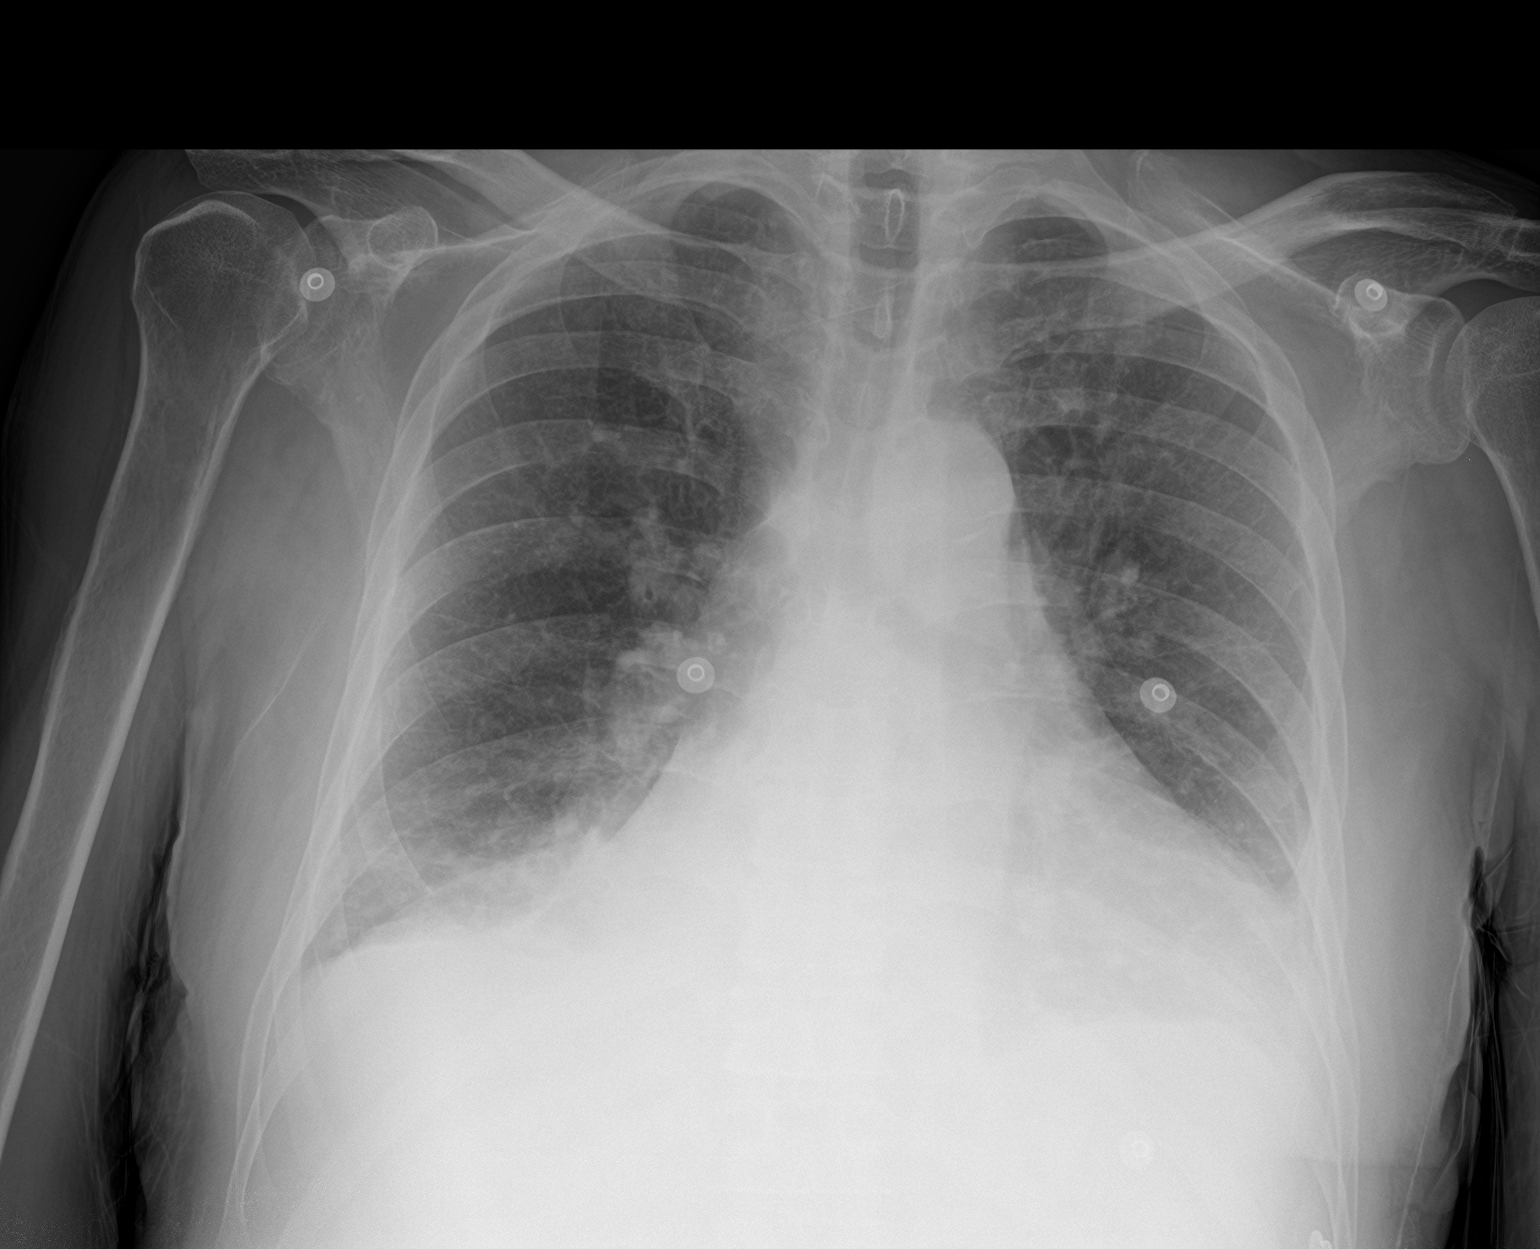

[2 of 2 positions shown; findings below may reference images not displayed]

FINDINGS: Cardiomegaly, vascular congestion. Bibasilar atelectasis. No
effusions or overt edema. No acute bony abnormality.
IMPRESSION: Cardiomegaly with vascular congestion.

Bibasilar atelectasis.

## 2021-11-10 IMAGING — CT CT CERVICAL SPINE W/O CM
3 of 4 series · 10 of 33 positions shown, 12 images · non-contrast
Comparison: None.

CLINICAL DATA: Status post cervical fusion, multiple falls with
neck injury

EXAM:
CT CERVICAL SPINE WITHOUT CONTRAST
TECHNIQUE: Multidetector CT imaging of the cervical spine was performed without
intravenous contrast. Multiplanar CT image reconstructions were also
generated.

[Series 5: sagittal bone · sagittal · 0.22mm/px · 5 of 61 slices shown, 6 images]
[im 21/61  bone]
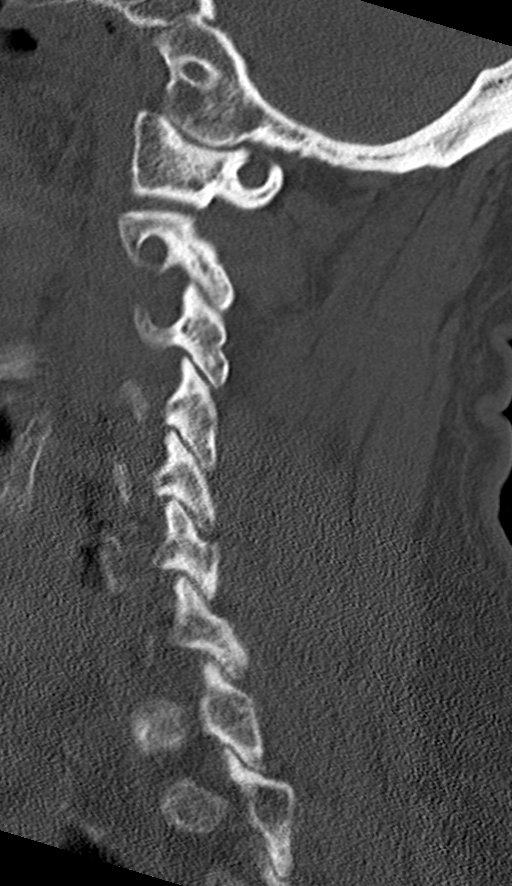
[im 26/61  bone]
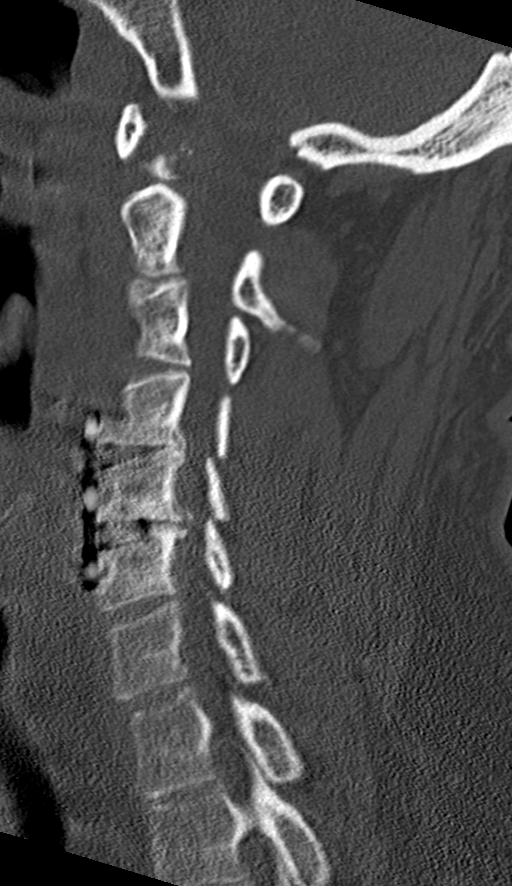
[im 31/61  soft-tissue]
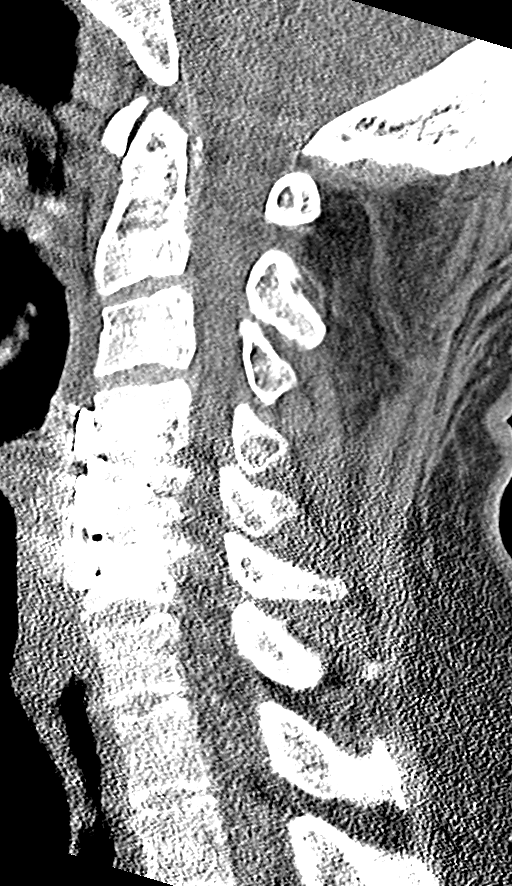
[im 31/61  bone]
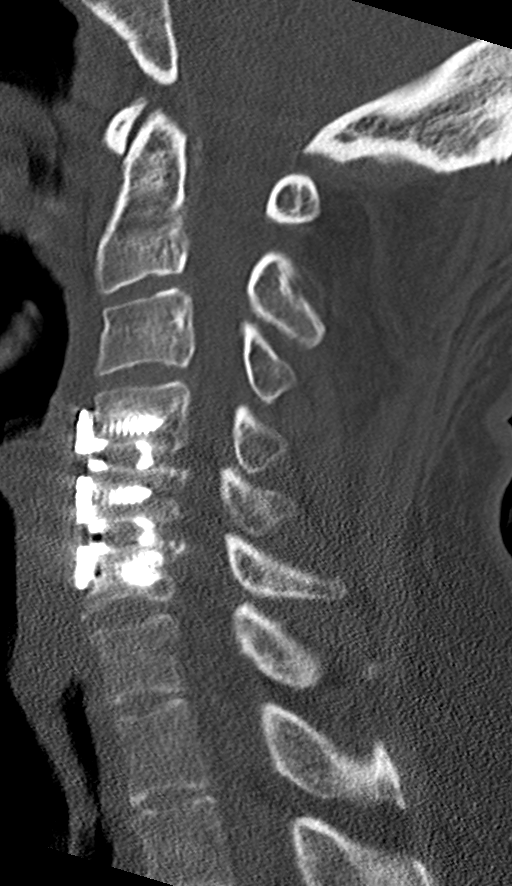
[im 36/61  bone]
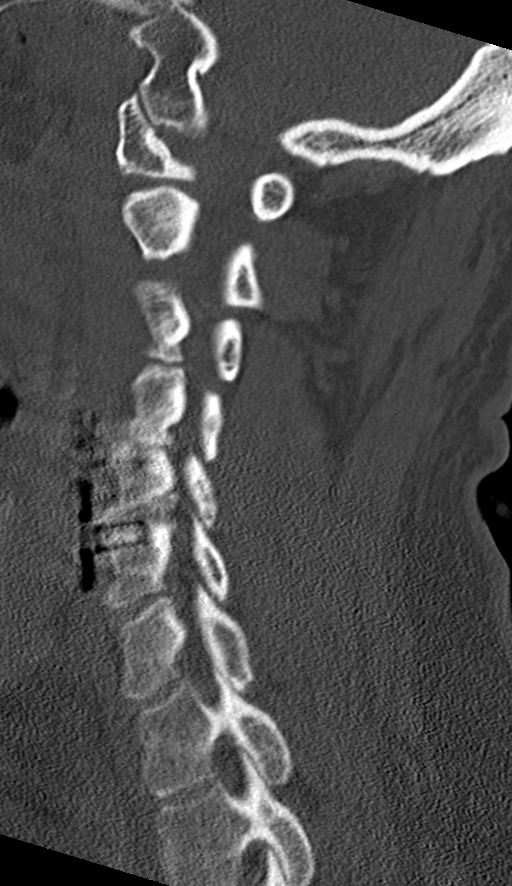
[im 41/61  bone]
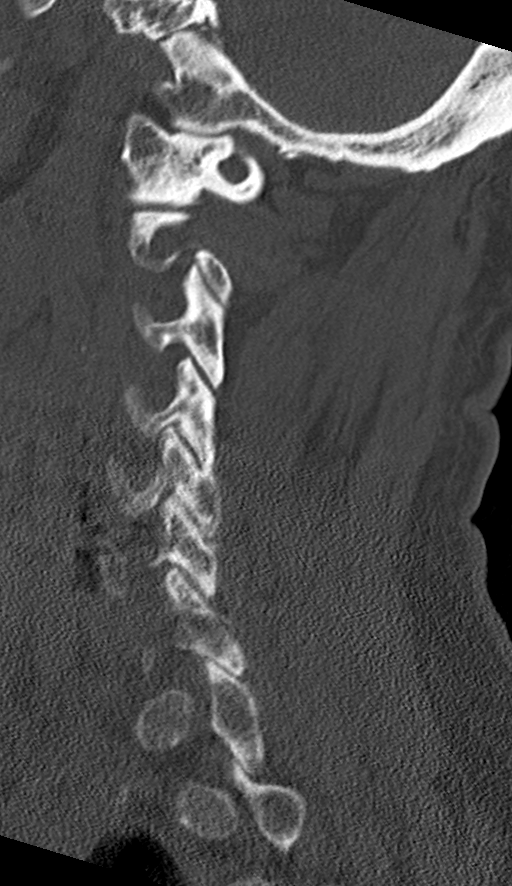

[Series 6: orthogonal bone · axial · 0.21mm/px · z∈[-218,-158]mm · 2 of 96 slices shown, 3 images]
[im 32/96  soft-tissue]
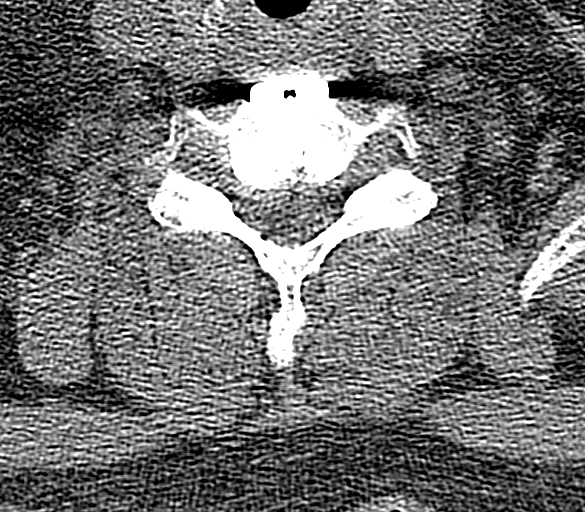
[im 32/96  bone]
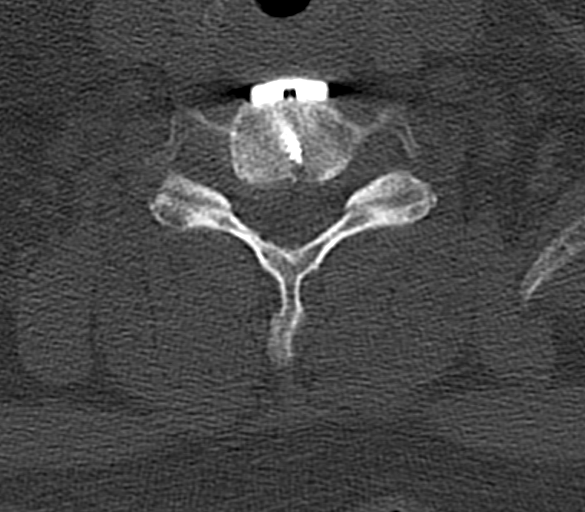
[im 64/96  bone]
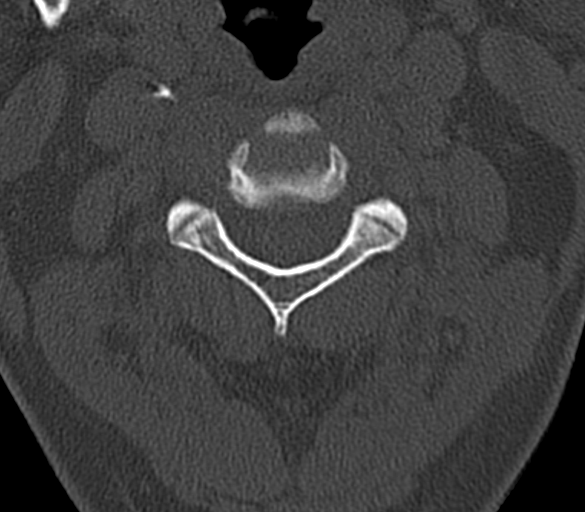

[Series 7: coronal bone · coronal · 0.24mm/px · 3 of 48 slices shown]
[im 10/48  bone]
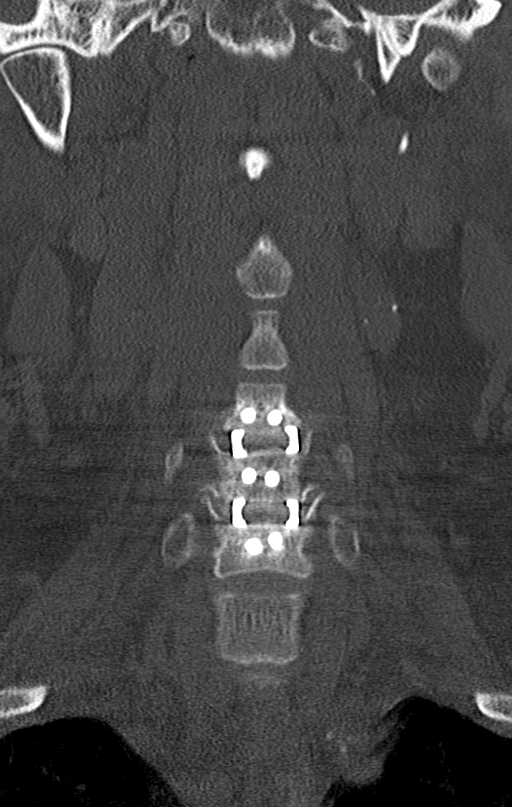
[im 19/48  bone]
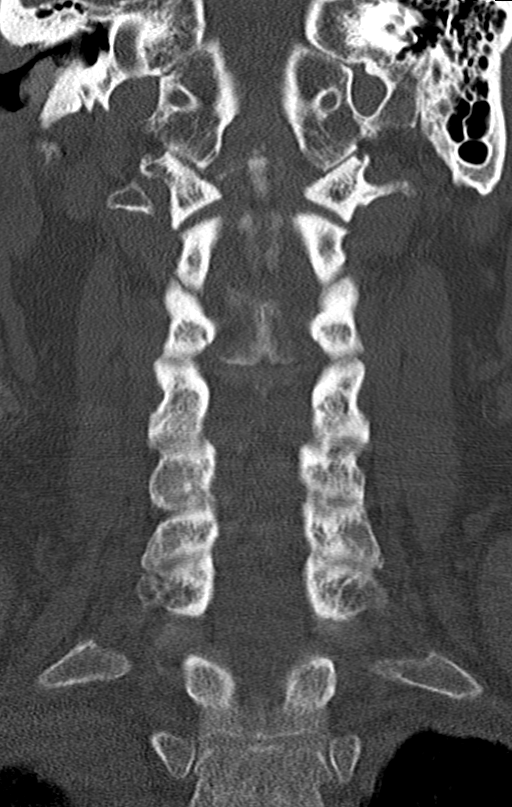
[im 29/48  bone]
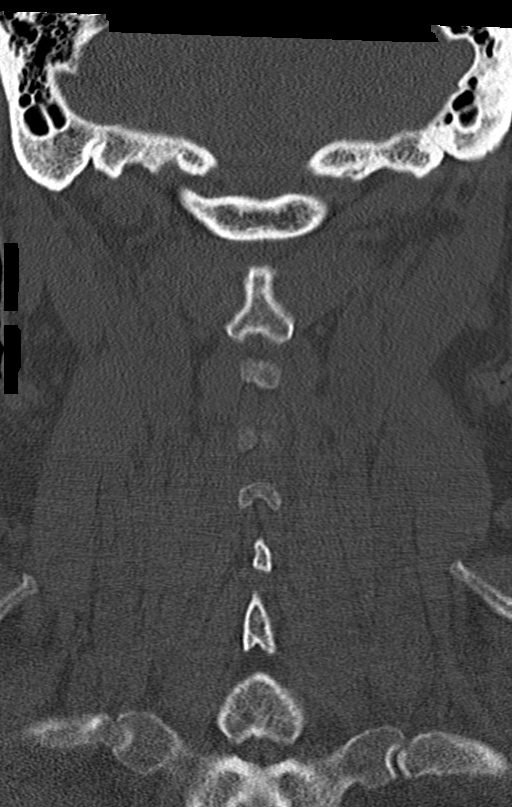

[10 of 33 positions shown; findings below may reference images not displayed]

FINDINGS: Alignment: Anterior cervical discectomy and fusion procedure with
instrumentation at C4-C6 has been performed. No bridging callus yet
identified. Normal alignment across the fused segment. Normal
overall alignment. No listhesis.

Skull base and vertebrae: The craniocervical junction is
unremarkable. The atlantodental interval is normal. No acute
fracture of the cervical spine.

Soft tissues and spinal canal: There is congenital narrowing of the
spinal canal due to shortening of the pedicles which, in combination
with posterior disc osteophyte complex at C4-5 and C5-6 results in
moderate to severe central canal stenosis with an AP diameter of 6-7
mm at minimum and resultant flattening of the thecal sac. No canal
hematoma. No prevertebral soft tissue swelling. No paraspinal fluid
collections.

Disc levels: Vertebral body height has been preserved. Remaining
intervertebral disc height has been preserved. The spinal canal is
moderate to severely narrowed at the level of the cervical fusion
secondary to posterior disc osteophyte complex ease. The
prevertebral soft tissues are not thickened. Review of the axial
images demonstrates multilevel uncovertebral arthrosis resulting in
mild bilateral neural foraminal narrowing at C3-4 and C6-7, moderate
bilateral narrowing at C4-5, and moderate to severe bilateral
narrowing at C5-6.

Upper chest: Right pleural effusion is identified, not well assessed
on this examination.

Other: None significant
IMPRESSION: No acute fracture or listhesis of the cervical spine.

C4-C6 anterior cervical discectomy infusion.

Moderate to severe central canal stenosis at the level of the fused
segment secondary to posterior disc osteophyte complexes in
combination with congenital narrowing of the spinal canal at this
level.

Multilevel uncovertebral arthrosis resulting in multilevel neural
foraminal narrowing, most severe bilaterally at C5-6.

Right pleural effusion.

## 2021-11-10 IMAGING — CT CT ABD-PELV W/ CM
2 of 5 series · 15 of 46 positions shown, 17 images · IV contrast (OMNIPAQUE 300)
Comparison: 10/24/2020

CLINICAL DATA: Diverticulitis, abdominal pain, diarrhea

EXAM:
CT ABDOMEN AND PELVIS WITH CONTRAST
TECHNIQUE: Multidetector CT imaging of the abdomen and pelvis was performed
using the standard protocol following bolus administration of
intravenous contrast.
CONTRAST:  100mL OMNIPAQUE IOHEXOL 300 MG/ML  SOLN

[Series 2: axial st · axial · 0.77mm/px · z∈[-862,-468]mm · 12 of 91 slices shown, 14 images]
[im 6/91  soft-tissue]
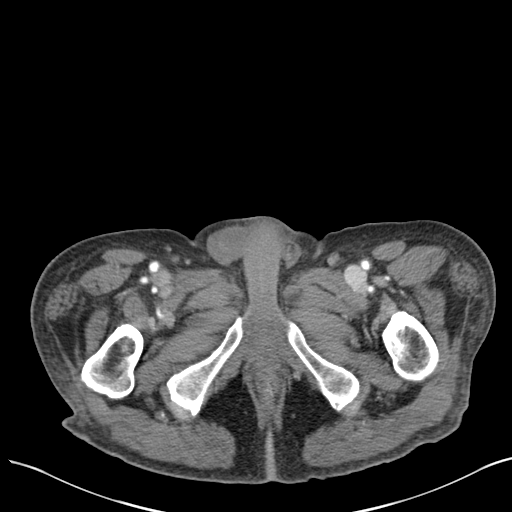
[im 6/91  bone]
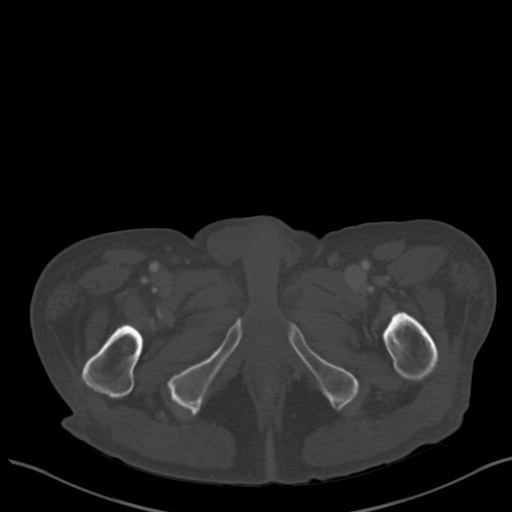
[im 12/91  soft-tissue]
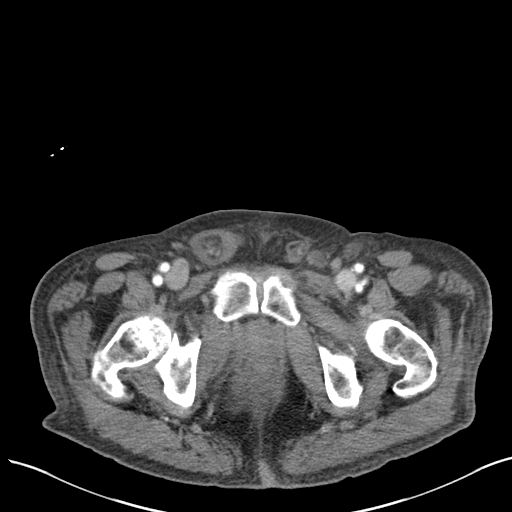
[im 23/91  soft-tissue]
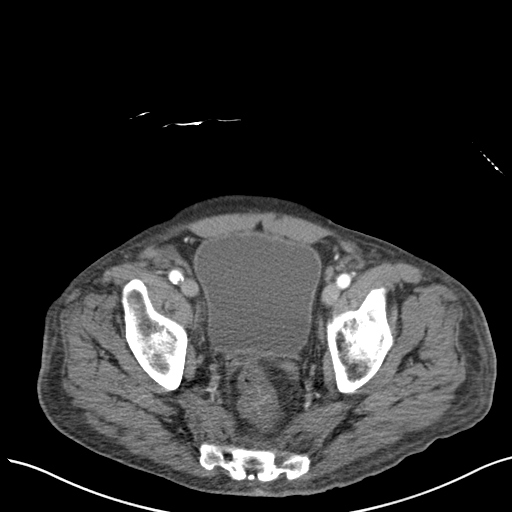
[im 29/91  soft-tissue]
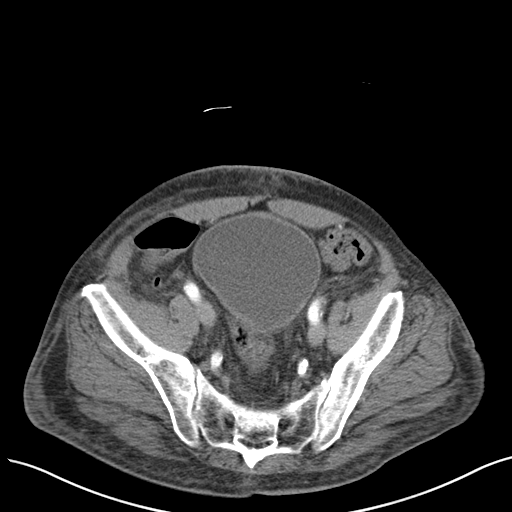
[im 34/91  soft-tissue]
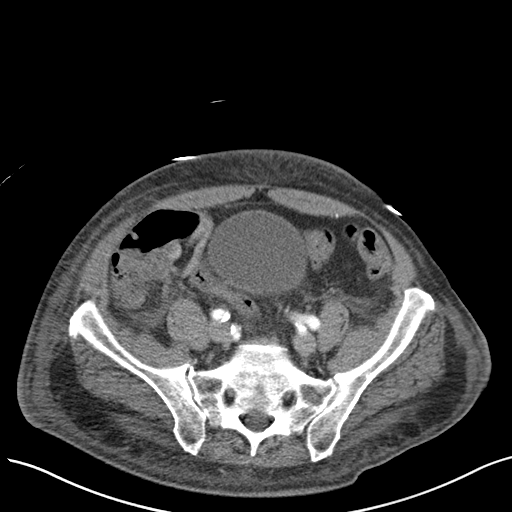
[im 40/91  soft-tissue]
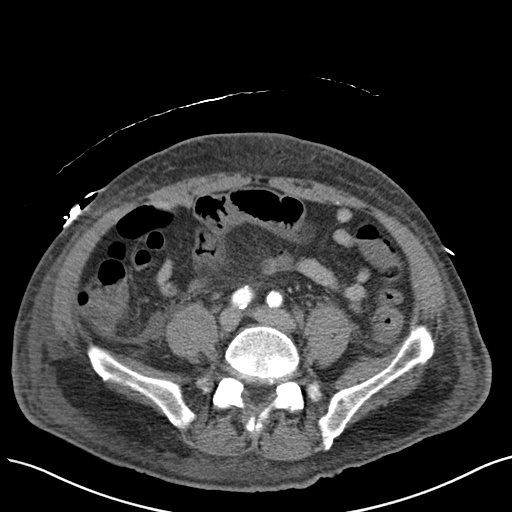
[im 51/91  soft-tissue]
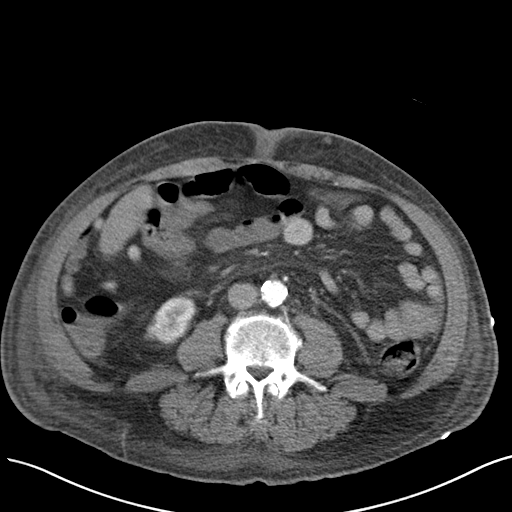
[im 57/91  soft-tissue]
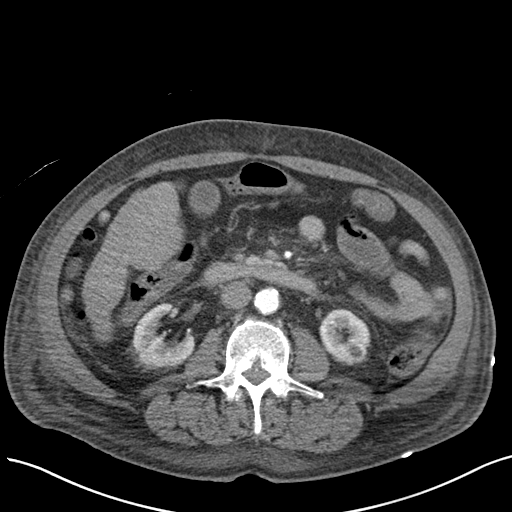
[im 62/91  soft-tissue]
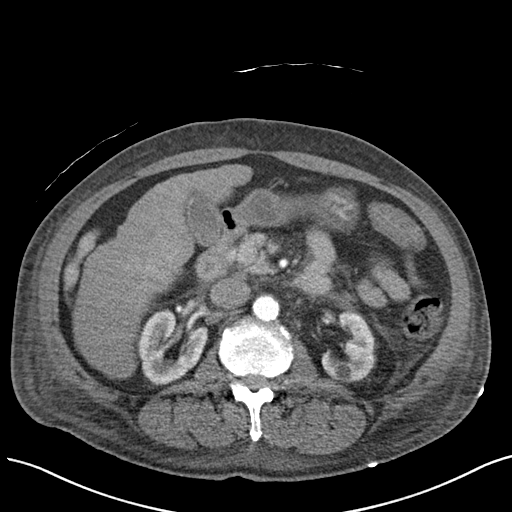
[im 62/91  bone]
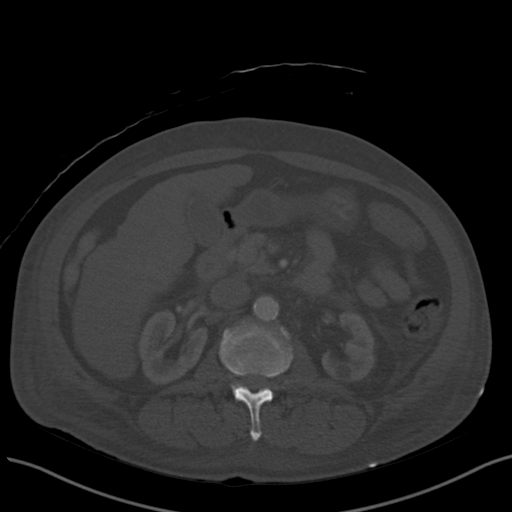
[im 68/91  soft-tissue]
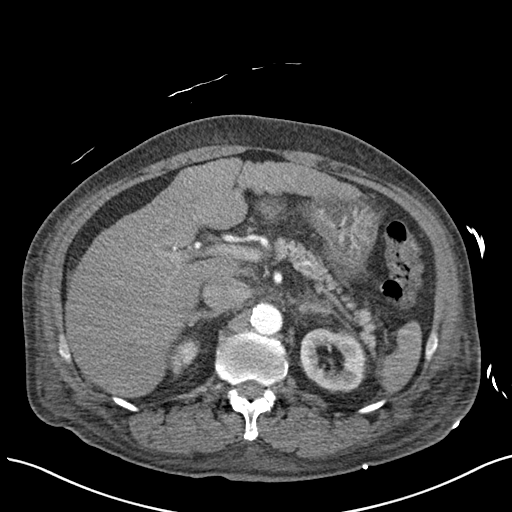
[im 79/91  soft-tissue]
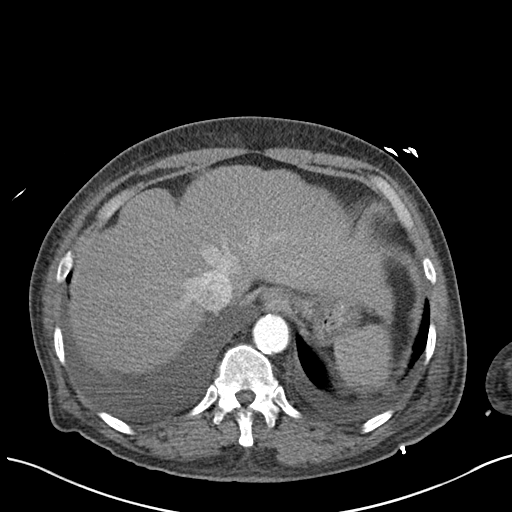
[im 85/91  soft-tissue]
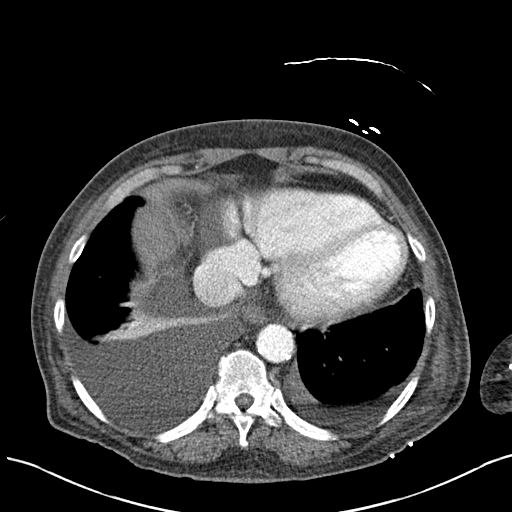

[Series 4: coronal st · coronal · 0.69mm/px · 3 of 139 slices shown]
[im 47/139  soft-tissue]
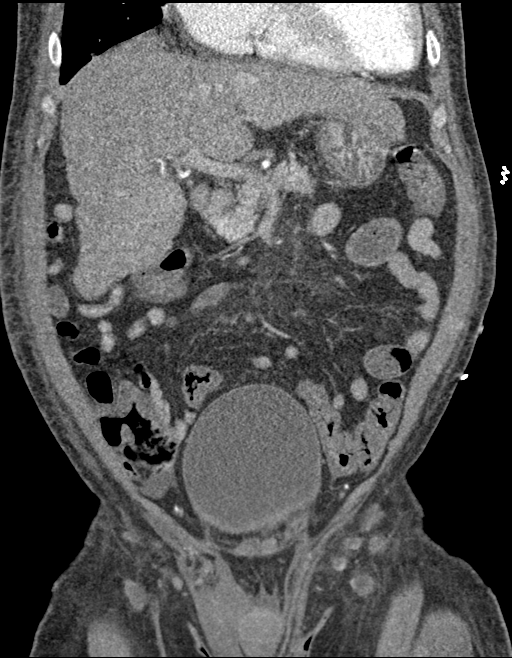
[im 62/139  soft-tissue]
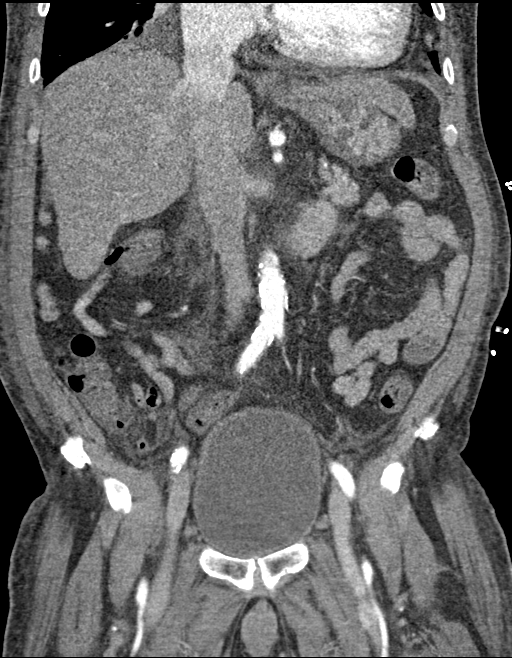
[im 77/139  soft-tissue]
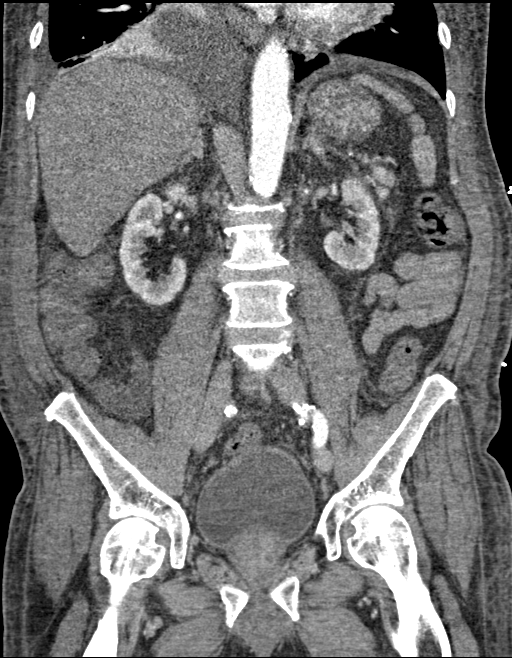

[15 of 46 positions shown; findings below may reference images not displayed]

FINDINGS: Lower chest: Mild to moderate global cardiomegaly. Small left and
moderate to large right pleural effusions are present with
compressive atelectasis of the right lung base noted.

Hepatobiliary: Nodular change of the liver in keeping with cirrhosis
again noted. No focal intrahepatic mass identified. Gallbladder
unremarkable. No intra or extrahepatic biliary ductal dilation.

Pancreas: Unremarkable

Spleen: Unremarkable

Adrenals/Urinary Tract: The adrenal glands are unremarkable. The
kidneys are mildly atrophic bilaterally. Simple cortical cyst seen
within the left ovary. Kidneys are otherwise unremarkable. Bladder
unremarkable

Stomach/Bowel: The colon is diffusely mildly thick walled without
significant pericolonic inflammatory stranding suggesting changes of
a portal colopathy. Similarly, there is rugal fold thickening and
submucosal edema involving the distal stomach which may reflect
changes of portal gastropathy. The stomach, small bowel, and large
bowel are otherwise unremarkable. No evidence of obstruction or
focal inflammation. Appendix normal. There is trace mesenteric edema
present. No free intraperitoneal gas.

Vascular/Lymphatic: Extensive aortoiliac atherosclerotic
calcification without evidence of aneurysm. No pathologic adenopathy
within the abdomen and pelvis.

Reproductive: Prostate is unremarkable.

Other: There is diffuse subcutaneous body wall edema noted. Mild
retroperitoneal edema within the pelvis noted. There is
circumferential asymmetrically more severe wall thickening involving
the rectum, possibly representing changes of superimposed proctitis.
Mild perirectal edema is present.

Musculoskeletal: No acute bone abnormality. No lytic or blastic bone
lesion.
IMPRESSION: Diffuse mild colonic wall thickening and rugal fold thickening
within the distal stomach suggesting changes of portal colopathy and
portal gastritis, respectively. Superimposed moderate
circumferential wall thickening and mild perirectal edema suggesting
superimposed infectious or inflammatory proctitis.

Cirrhosis.

Anasarca with bilateral pleural effusions, diffuse body wall edema,
mesenteric edema, and retroperitoneal edema.

Moderate cardiomegaly.

Aortic Atherosclerosis (DRBB9-1DI.I).

## 2021-11-13 IMAGING — US US ABDOMEN LIMITED
1 series · 14 of 19 positions shown · non-contrast
Comparison: None.

CLINICAL DATA: Abdominal distension

EXAM:
LIMITED ABDOMEN ULTRASOUND FOR ASCITES
TECHNIQUE: Limited ultrasound survey for ascites was performed in all four
abdominal quadrants.

[Series 1: us abdomen limited · 19 acquisitions, 14 frames shown]
[im 1/19]
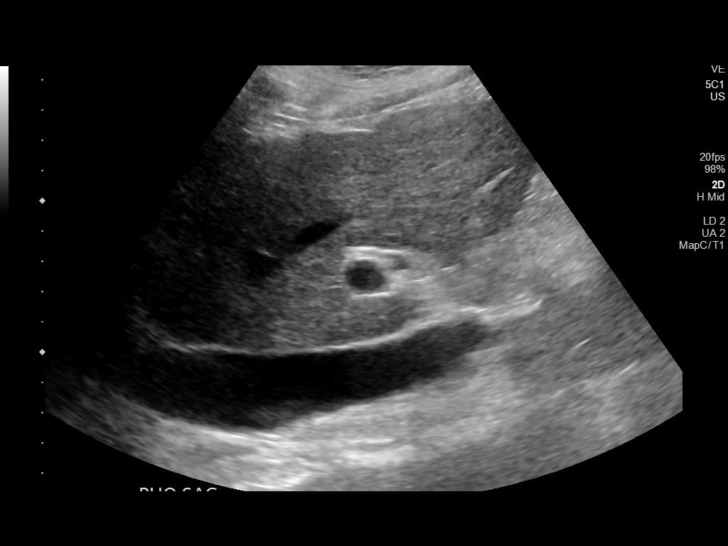
[im 3/19]
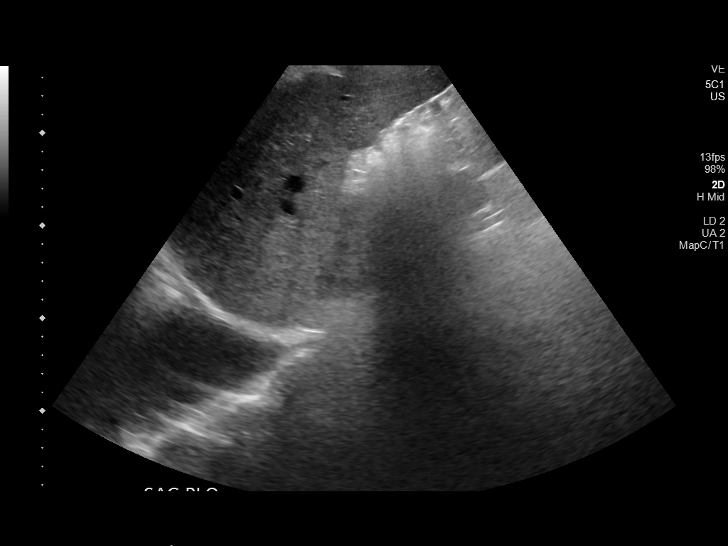
[im 4/19]
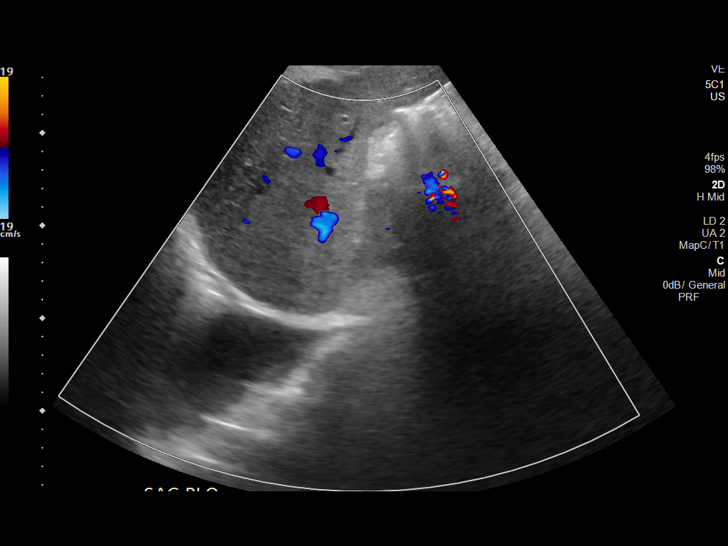
[im 5/19]
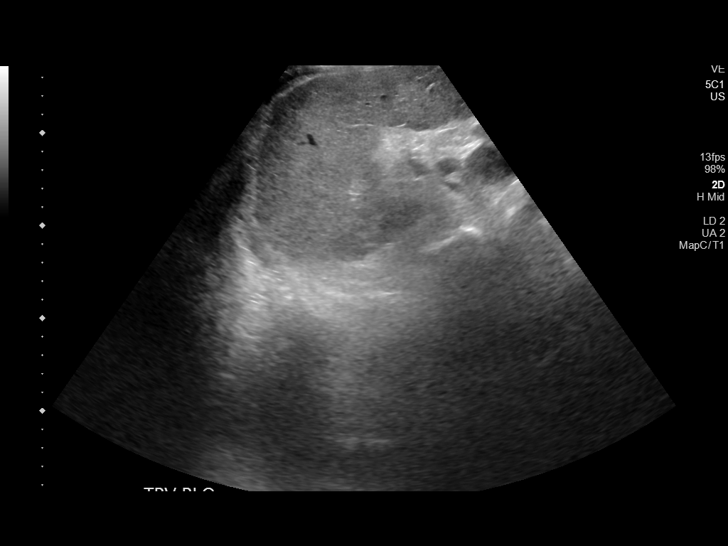
[im 7/19]
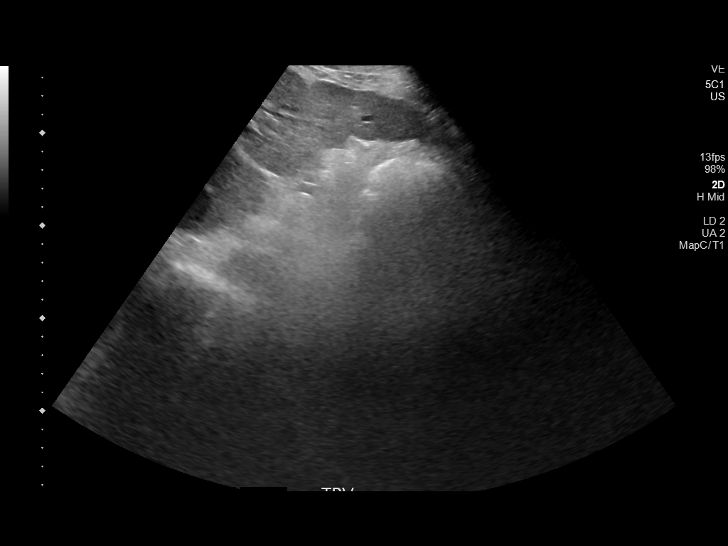
[im 8/19]
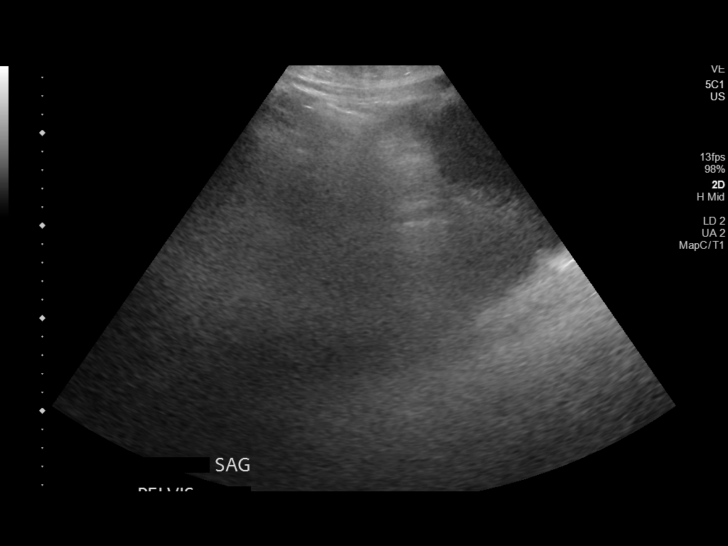
[im 9/19]
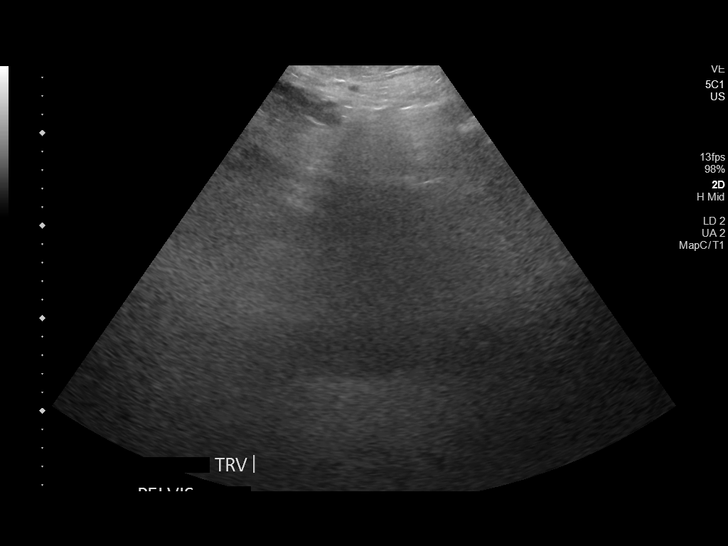
[im 11/19]
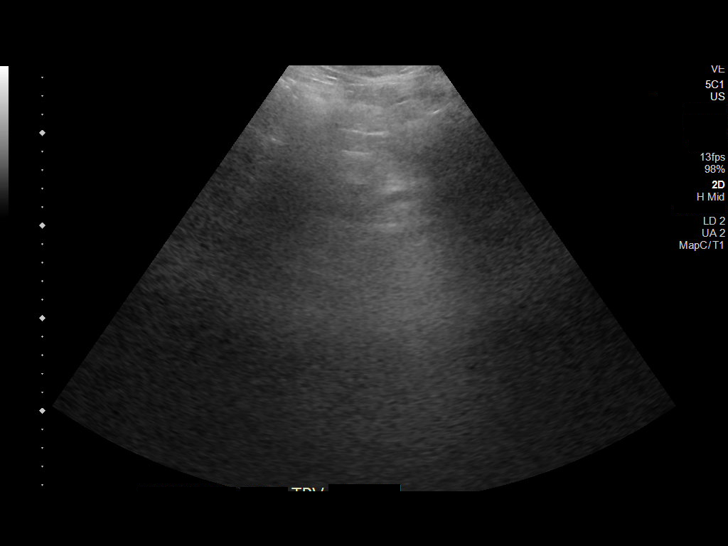
[im 12/19]
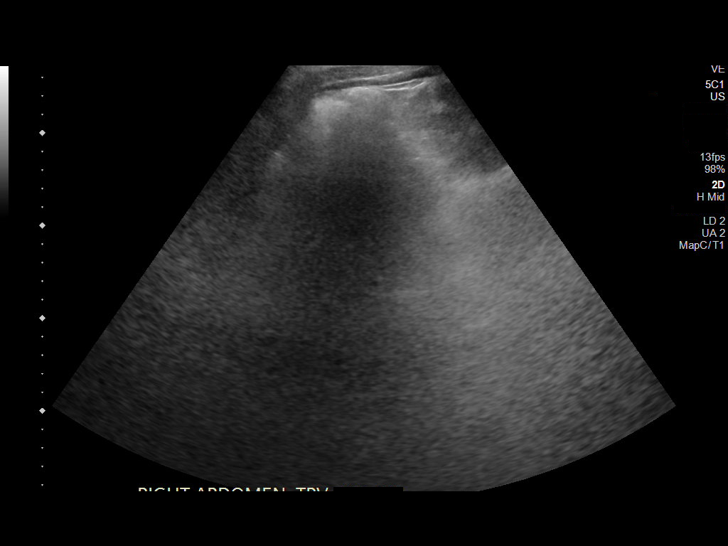
[im 13/19]
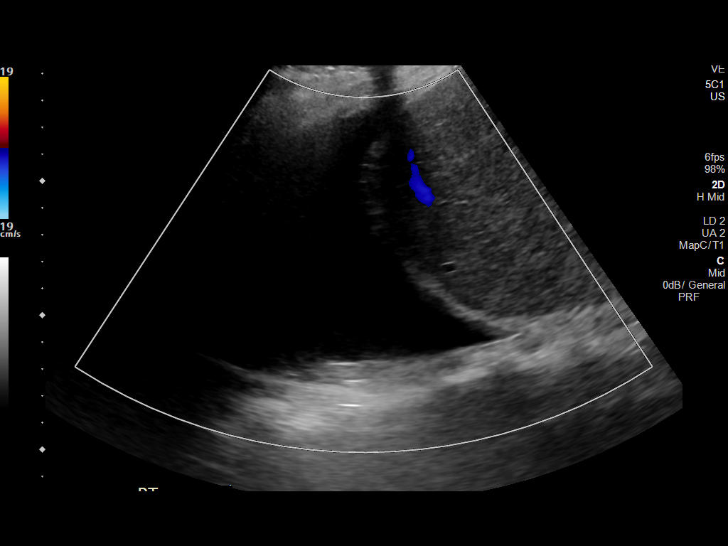
[im 15/19]
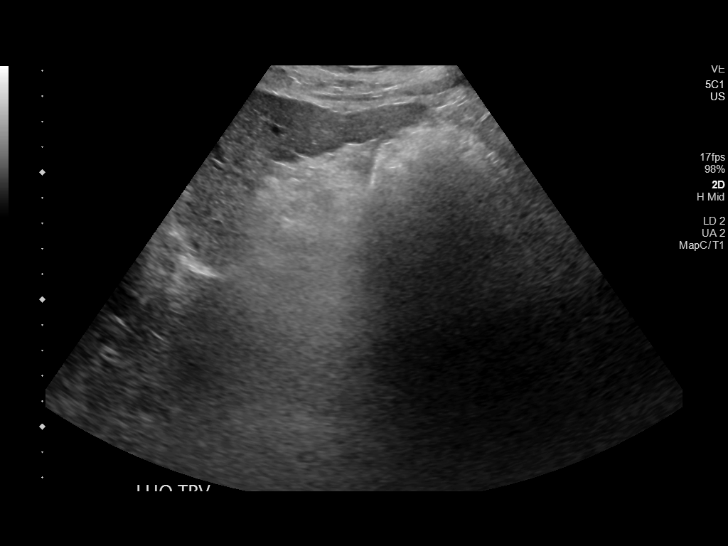
[im 16/19]
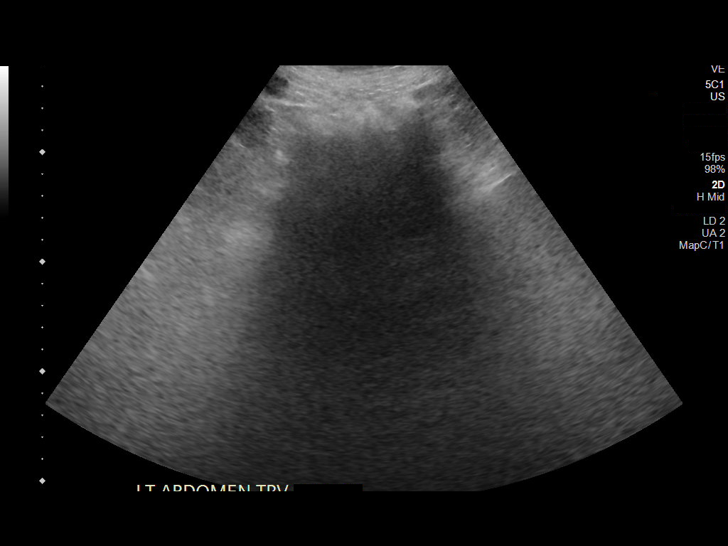
[im 17/19]
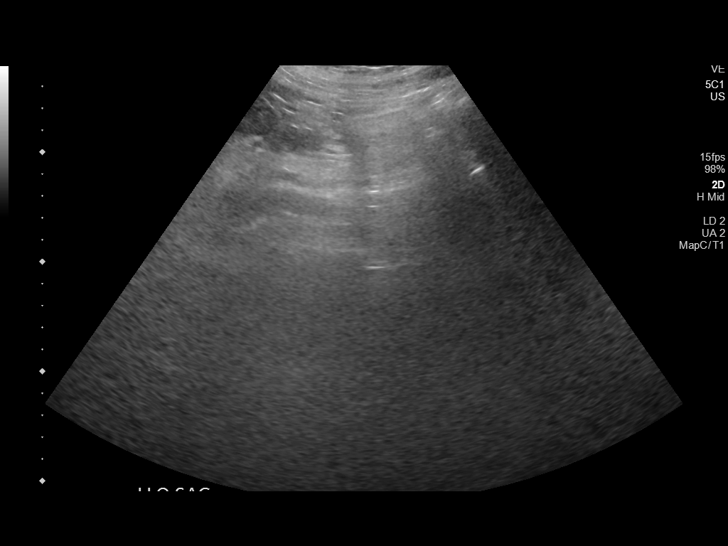
[im 19/19]
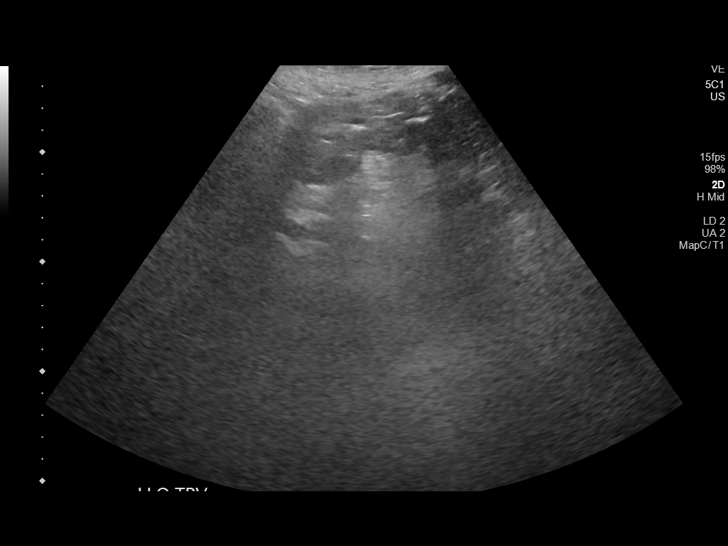

[14 of 19 positions shown; findings below may reference images not displayed]

FINDINGS: Four quadrant examination of the abdomen demonstrates no ascites.
Liver has a lobular contour. RIGHT effusion noted.
IMPRESSION: 1. No ascites.
2. Lobular liver.
3. RIGHT effusion.

## 2021-11-29 IMAGING — CT CT CERVICAL SPINE W/O CM
3 of 4 series · 12 of 33 positions shown, 14 images · non-contrast
Comparison: 03/30/2020, 11/07/2020

CLINICAL DATA: Fall 4 days ago, headache and blurred vision.
History of cervical fusion

EXAM:
CT HEAD WITHOUT CONTRAST
CT CERVICAL SPINE WITHOUT CONTRAST
TECHNIQUE: Multidetector CT imaging of the head and cervical spine was
performed following the standard protocol without intravenous
contrast. Multiplanar CT image reconstructions of the cervical spine
were also generated.

[Series 6: orthogonal bone · axial · 0.23mm/px · z∈[-288,-157]mm · 4 of 102 slices shown, 5 images]
[im 17/102  soft-tissue]
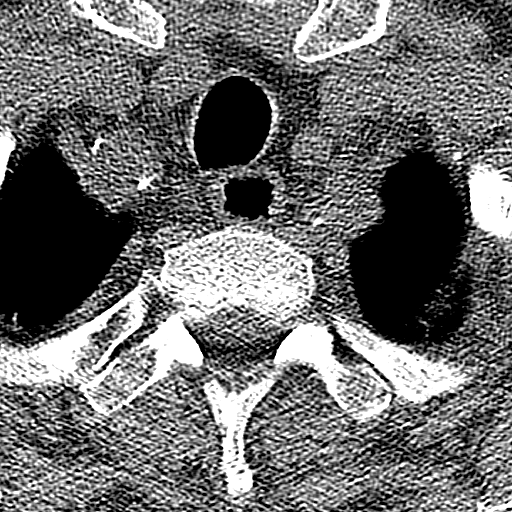
[im 17/102  bone]
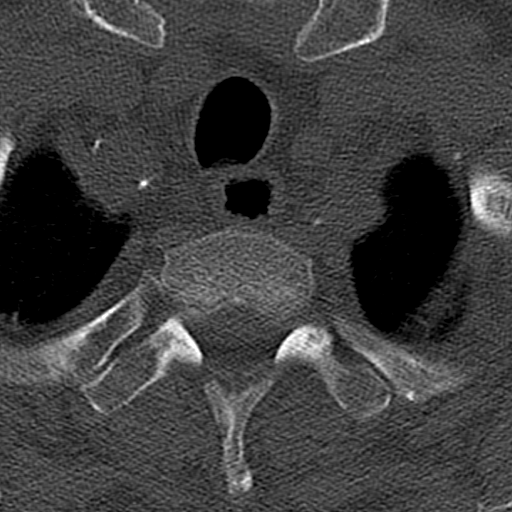
[im 34/102  bone]
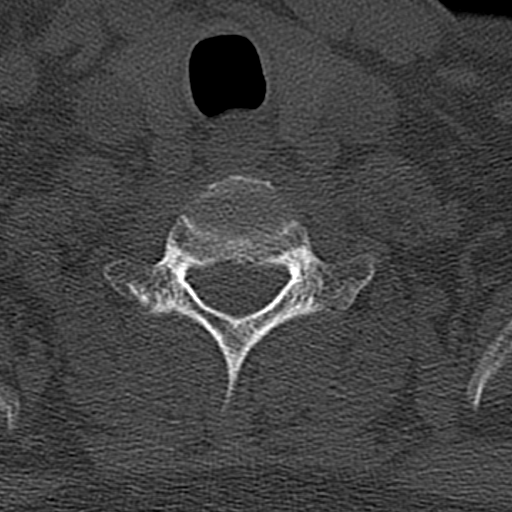
[im 68/102  bone]
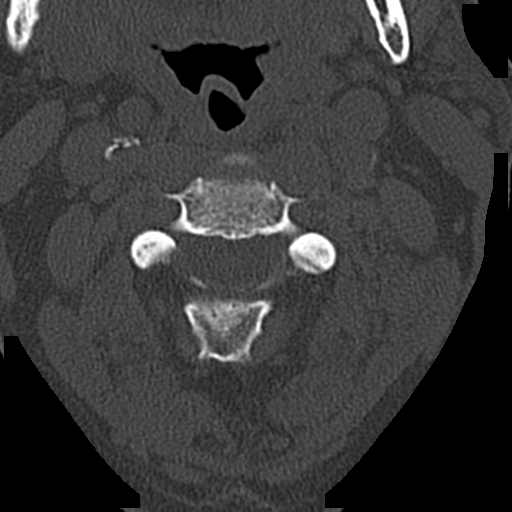
[im 85/102  bone]
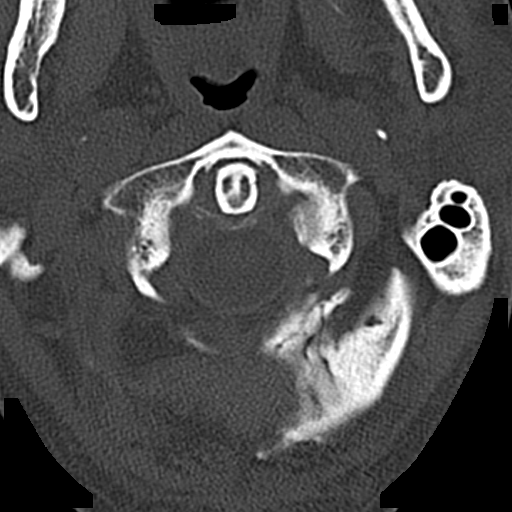

[Series 7: coronal bone · coronal · 0.29mm/px · 3 of 64 slices shown]
[im 13/64  bone]
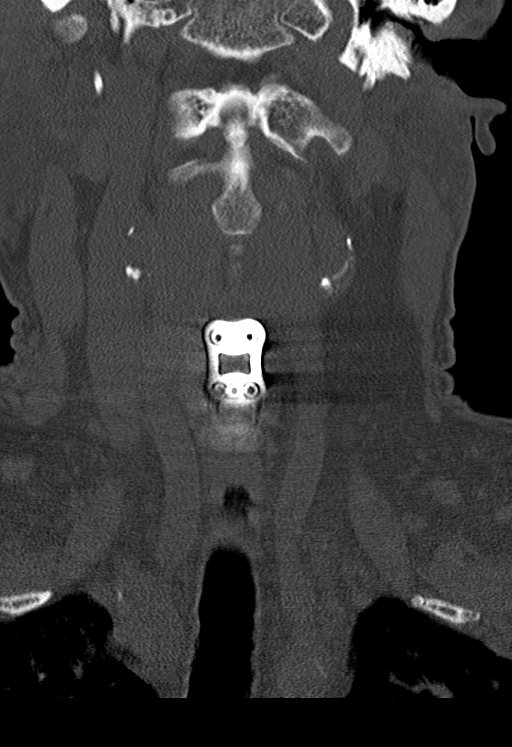
[im 26/64  bone]
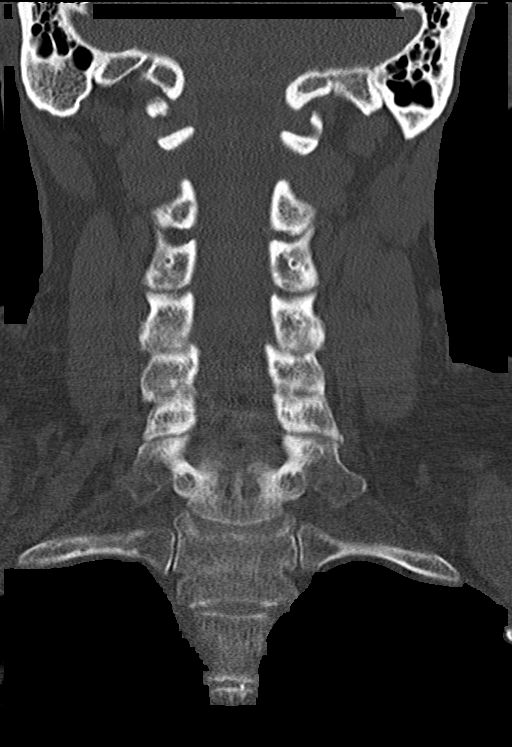
[im 38/64  bone]
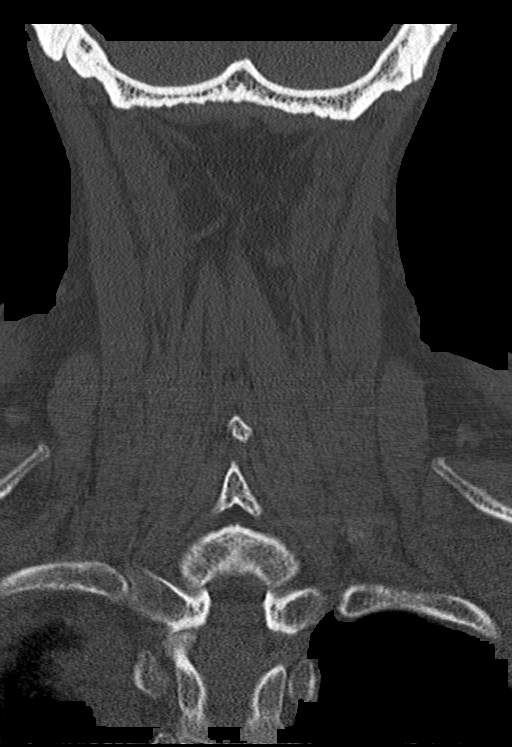

[Series 8: sagittal bone · sagittal · 0.29mm/px · 5 of 61 slices shown, 6 images]
[im 21/61  bone]
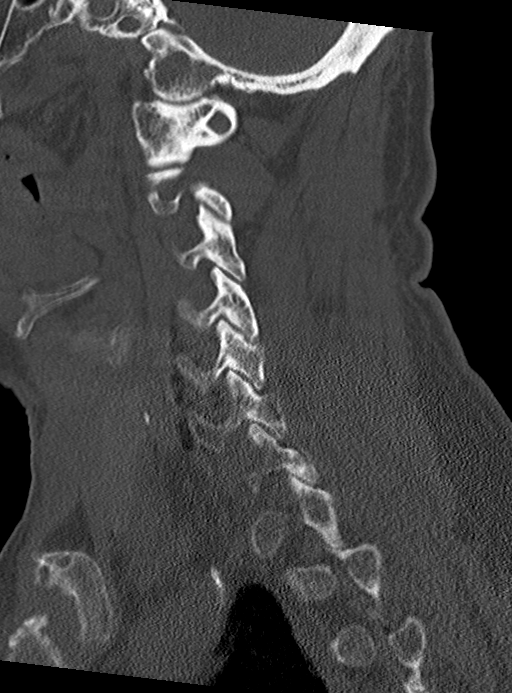
[im 26/61  bone]
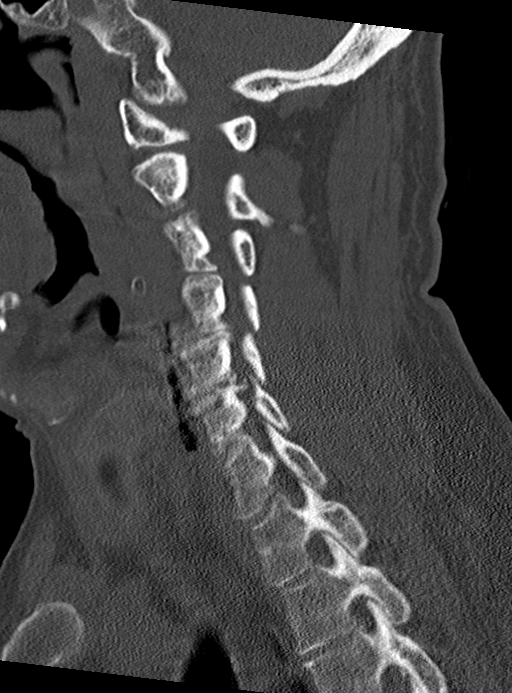
[im 31/61  soft-tissue]
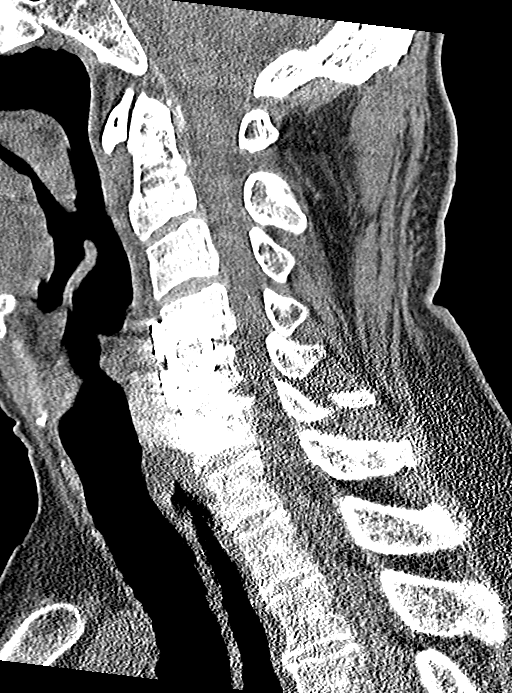
[im 31/61  bone]
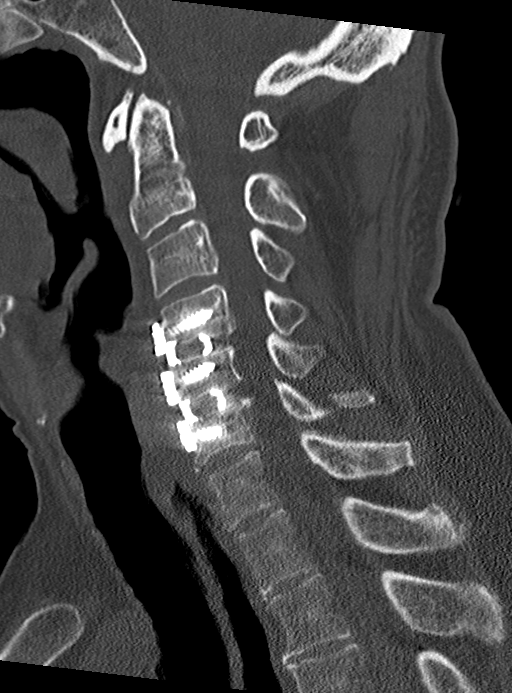
[im 36/61  bone]
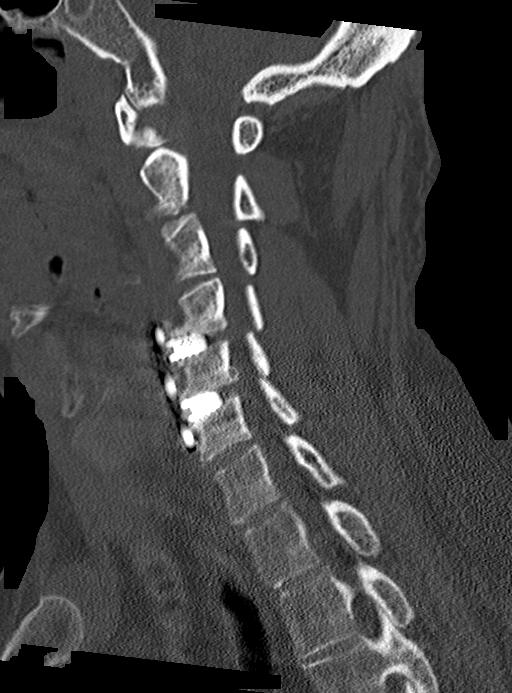
[im 41/61  bone]
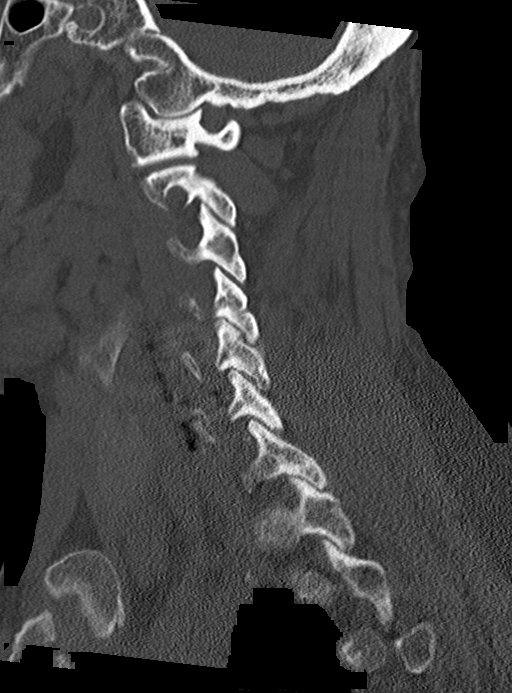

[12 of 33 positions shown; findings below may reference images not displayed]

FINDINGS: CT HEAD FINDINGS

Brain: No evidence of acute infarction, hemorrhage, hydrocephalus,
extra-axial collection or mass lesion/mass effect. Scattered
low-density changes within the periventricular and subcortical white
matter compatible with chronic microvascular ischemic change. Mild
diffuse cerebral volume loss.

Vascular: Atherosclerotic calcifications involving the large vessels
of the skull base. No unexpected hyperdense vessel.

Skull: Normal. Negative for fracture or focal lesion.

Sinuses/Orbits: No acute finding.

Other: Negative for scalp hematoma.

CT CERVICAL SPINE FINDINGS

Alignment: Facet joints are aligned without dislocation or traumatic
listhesis. Dens and lateral masses are aligned. Straightening of the
cervical lordosis.

Skull base and vertebrae: Prior C4-C6 ACDF. Hardware remains well
seated without evidence of complication. No acute fracture. No
suspicious bone lesion.

Soft tissues and spinal canal: No prevertebral fluid or swelling. No
visible canal hematoma.

Disc levels: Disc osteophyte complexes with prominent uncovertebral
spurring at C4-5 and C5-6 are unchanged in appearance compared to
the prior exam.

Upper chest: Layering right-sided pleural effusion is identified.

Other: None.
IMPRESSION: 1. No acute intracranial findings.
2. No evidence of acute fracture or traumatic listhesis of the
cervical spine.
3. Prior C4-C6 ACDF without evidence of complication.
4. Layering right-sided pleural effusion.

## 2021-11-29 IMAGING — CT CT HEAD W/O CM
3 series · 14 of 47 positions shown, 16 images · non-contrast
Comparison: 03/30/2020, 11/07/2020

CLINICAL DATA: Fall 4 days ago, headache and blurred vision.
History of cervical fusion

EXAM:
CT HEAD WITHOUT CONTRAST
CT CERVICAL SPINE WITHOUT CONTRAST
TECHNIQUE: Multidetector CT imaging of the head and cervical spine was
performed following the standard protocol without intravenous
contrast. Multiplanar CT image reconstructions of the cervical spine
were also generated.

[Series 3: head wo · axial · 0.47mm/px · z∈[-118,+12]mm · 8 of 32 slices shown, 10 images]
[im 3/32  brain]
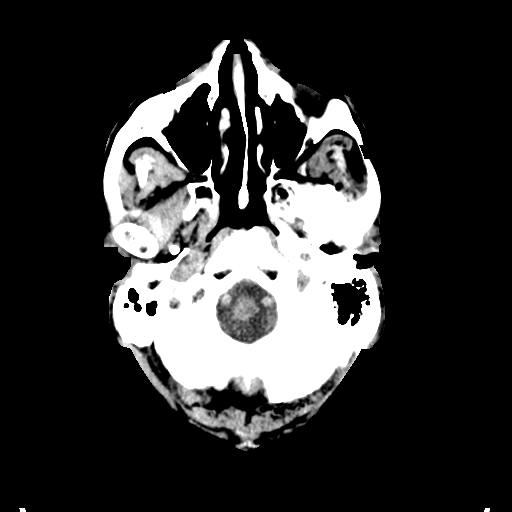
[im 3/32  bone]
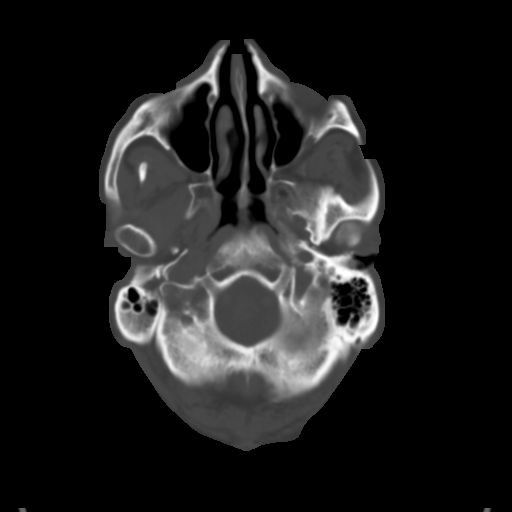
[im 7/32  brain]
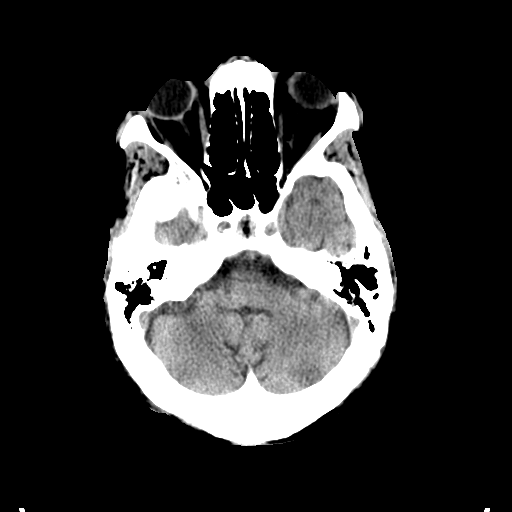
[im 10/32  brain]
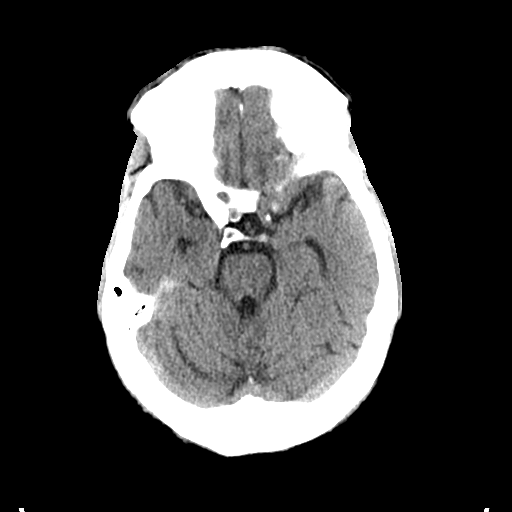
[im 14/32  brain]
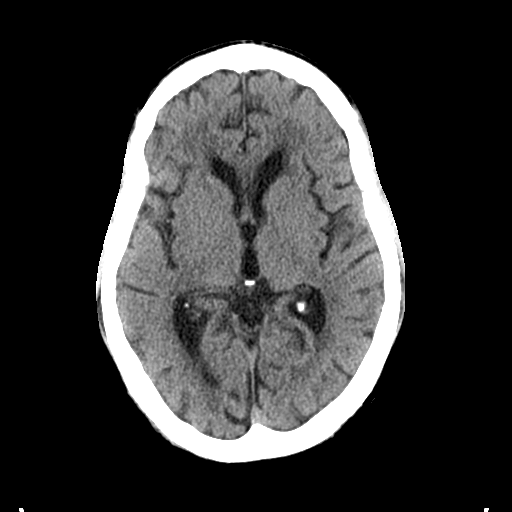
[im 18/32  brain]
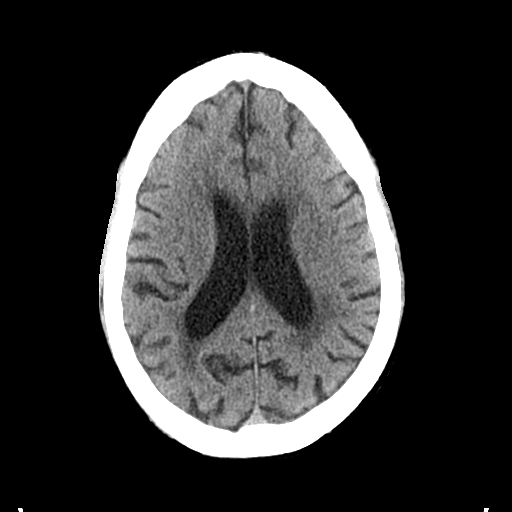
[im 18/32  bone]
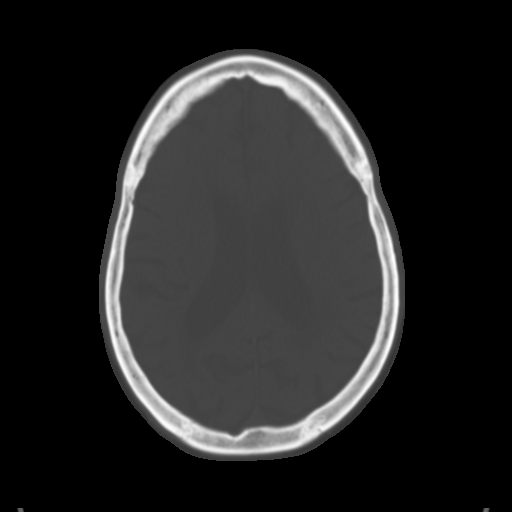
[im 22/32  brain]
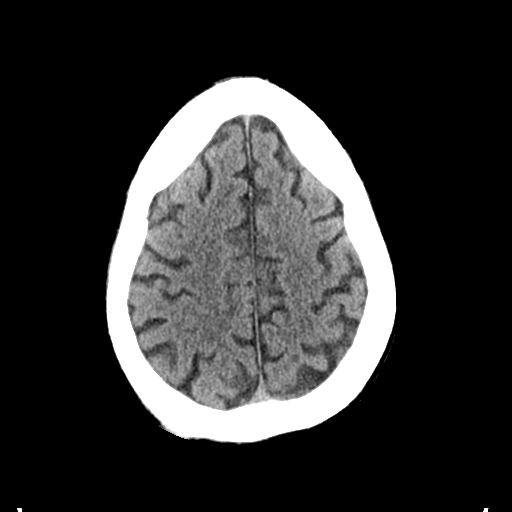
[im 25/32  brain]
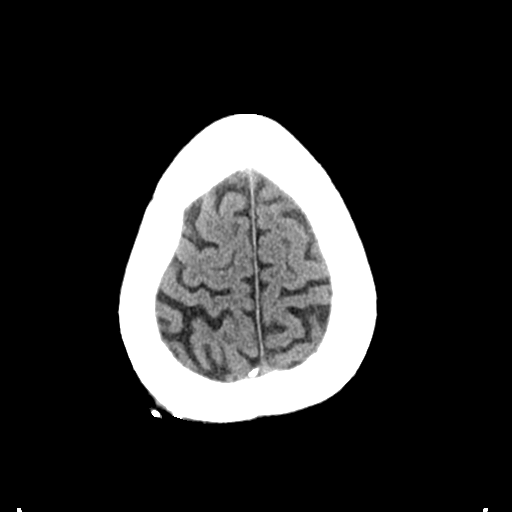
[im 29/32  brain]
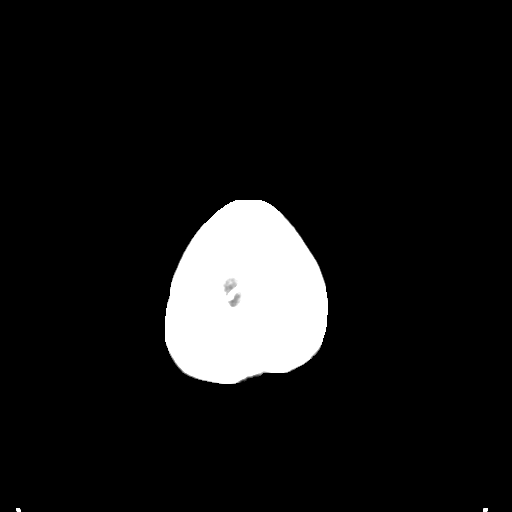

[Series 6: coronal soft tissue · coronal · 0.32mm/px · 3 of 74 slices shown]
[im 25/74  brain]
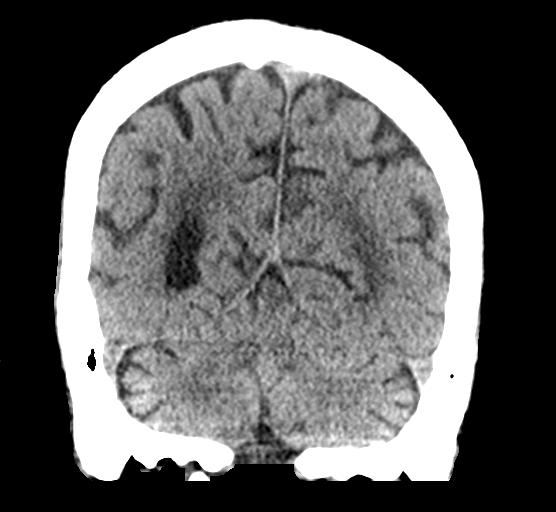
[im 33/74  brain]
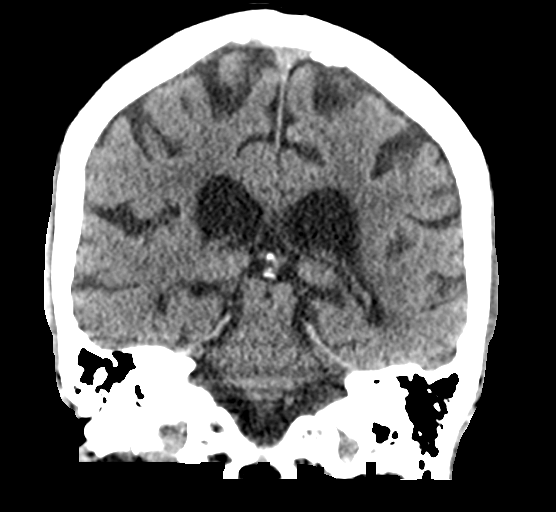
[im 41/74  brain]
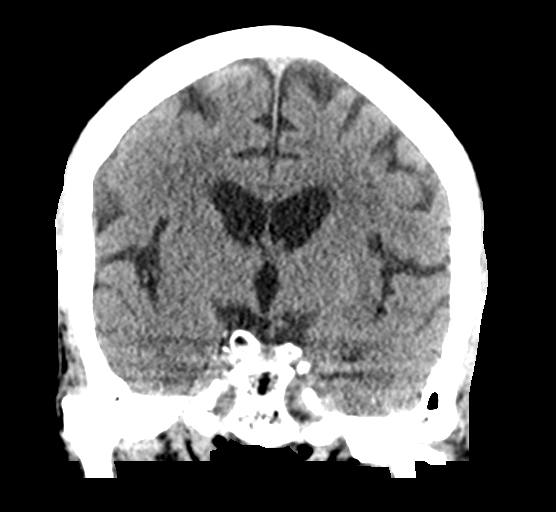

[Series 7: sagittal soft tissue · sagittal · 0.31mm/px · 3 of 59 slices shown]
[im 20/59  brain]
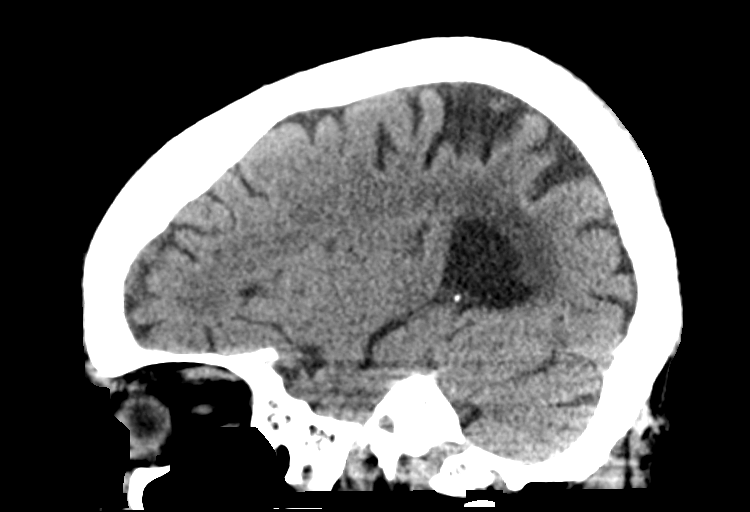
[im 30/59  brain]
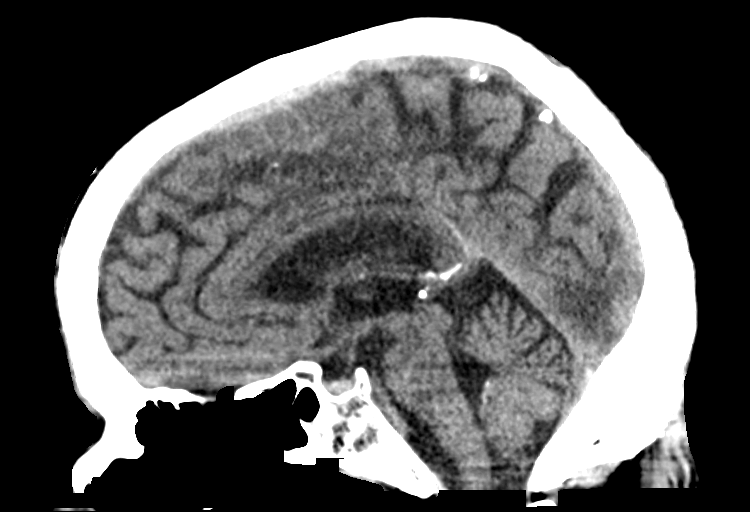
[im 39/59  brain]
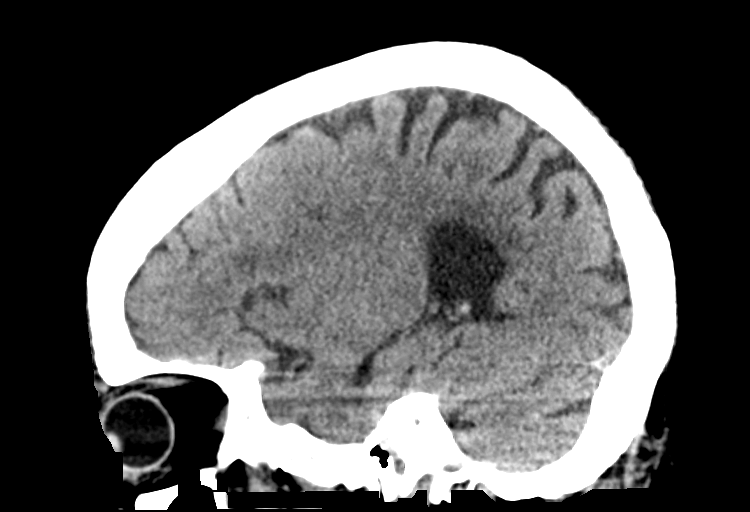

[14 of 47 positions shown; findings below may reference images not displayed]

FINDINGS: CT HEAD FINDINGS

Brain: No evidence of acute infarction, hemorrhage, hydrocephalus,
extra-axial collection or mass lesion/mass effect. Scattered
low-density changes within the periventricular and subcortical white
matter compatible with chronic microvascular ischemic change. Mild
diffuse cerebral volume loss.

Vascular: Atherosclerotic calcifications involving the large vessels
of the skull base. No unexpected hyperdense vessel.

Skull: Normal. Negative for fracture or focal lesion.

Sinuses/Orbits: No acute finding.

Other: Negative for scalp hematoma.

CT CERVICAL SPINE FINDINGS

Alignment: Facet joints are aligned without dislocation or traumatic
listhesis. Dens and lateral masses are aligned. Straightening of the
cervical lordosis.

Skull base and vertebrae: Prior C4-C6 ACDF. Hardware remains well
seated without evidence of complication. No acute fracture. No
suspicious bone lesion.

Soft tissues and spinal canal: No prevertebral fluid or swelling. No
visible canal hematoma.

Disc levels: Disc osteophyte complexes with prominent uncovertebral
spurring at C4-5 and C5-6 are unchanged in appearance compared to
the prior exam.

Upper chest: Layering right-sided pleural effusion is identified.

Other: None.
IMPRESSION: 1. No acute intracranial findings.
2. No evidence of acute fracture or traumatic listhesis of the
cervical spine.
3. Prior C4-C6 ACDF without evidence of complication.
4. Layering right-sided pleural effusion.

## 2021-12-10 IMAGING — MR MR CERVICAL SPINE WO/W CM
9 of 15 series · 23 of 48 positions shown · IV contrast (gadavist)
Comparison: Cervical spine CT 11/26/2020.

CLINICAL DATA: 59-year-old male with fall earlier this month. Prior
ACDF in [REDACTED]. Progressive neck pain.

EXAM:
MRI CERVICAL SPINE WITHOUT AND WITH CONTRAST
TECHNIQUE: Multiplanar and multiecho pulse sequences of the cervical spine, to
include the craniocervical junction and cervicothoracic junction,
were obtained without and with intravenous contrast.
CONTRAST:  8mL GADAVIST GADOBUTROL 1 MMOL/ML IV SOLN

[Series 9: T2 · sagittal · 3.0mm · 0.69mm/px · 1 of 16 slices shown (1 of 2)]
[im 1/16]
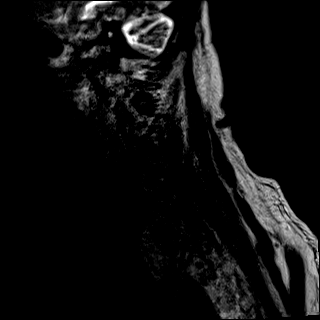

[Series 10: T1 · sagittal · 3.0mm · 0.69mm/px · 1 of 16 slices shown]
[im 1/16]
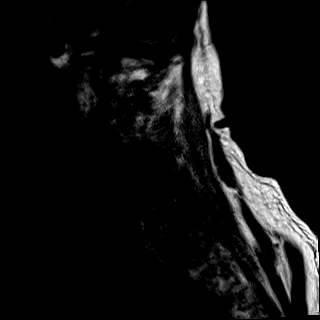

[Series 11: STIR · sagittal · 3.0mm · 0.86mm/px · 1 of 16 slices shown]
[im 1/16]
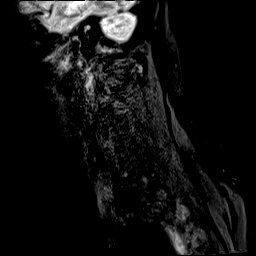

[Series 12: T2 · axial · 3.0mm · 0.66mm/px · z∈[-139,-20]mm · 4 of 40 slices shown (2 of 2)]
[im 1/40]
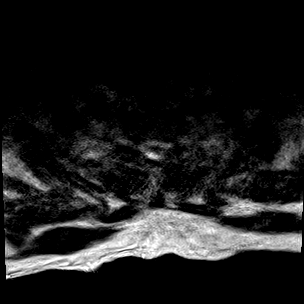
[im 14/40]
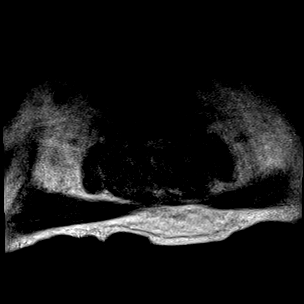
[im 27/40]
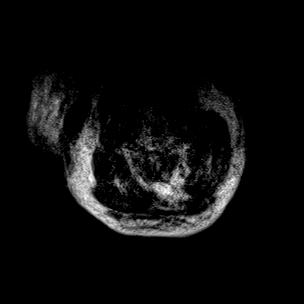
[im 40/40]
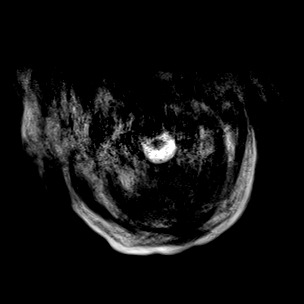

[Series 13: GRE · axial · 3.0mm · 0.39mm/px · z∈[-139,-20]mm · 4 of 40 slices shown]
[im 1/40]
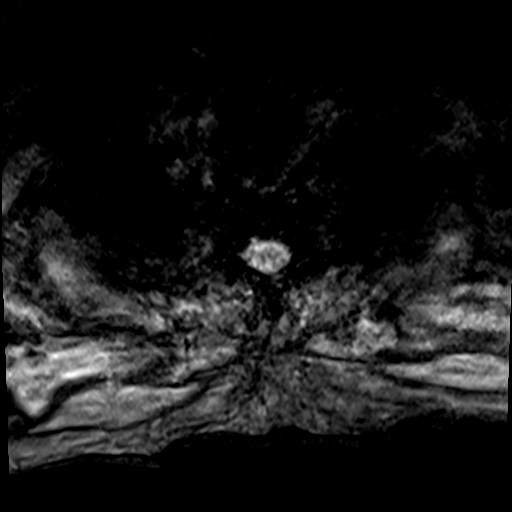
[im 14/40]
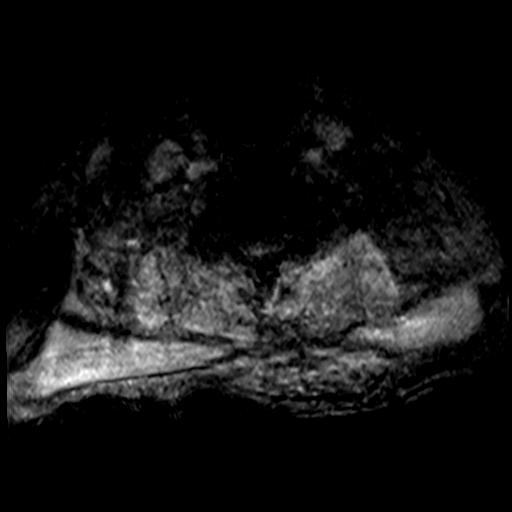
[im 27/40]
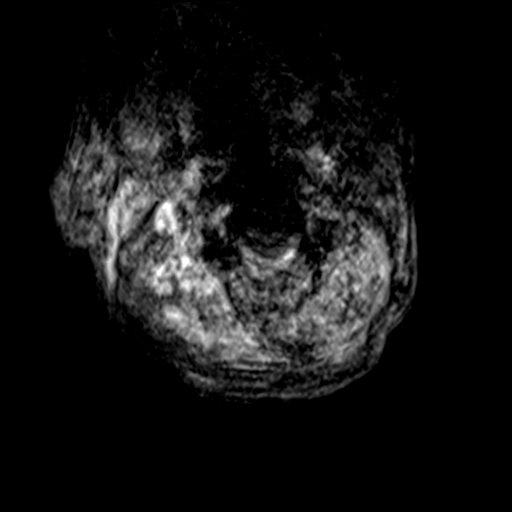
[im 40/40]
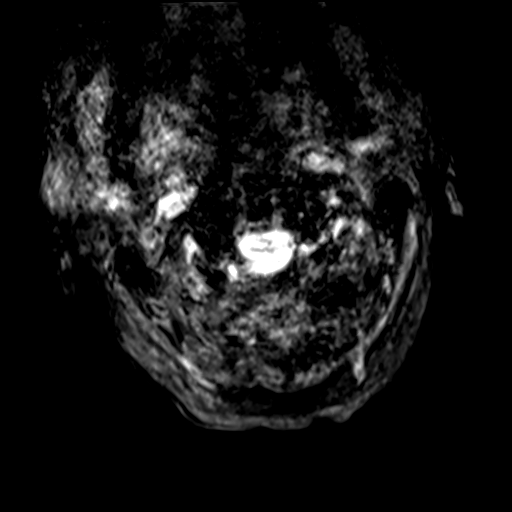

[Series 14: t1_tse_warp_tra · axial · 3.0mm · 0.39mm/px · z∈[-139,-20]mm · 4 of 40 slices shown (1 of 2)]
[im 1/40]
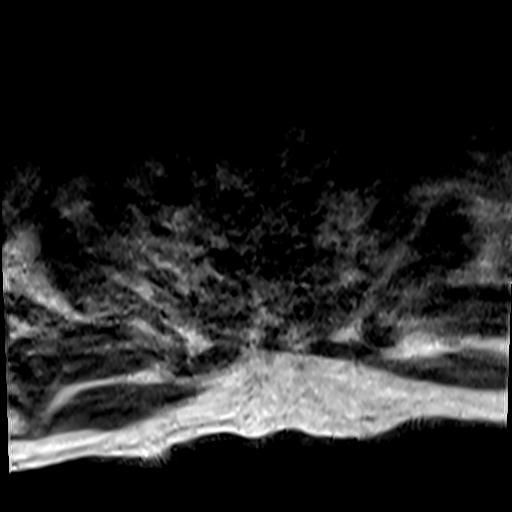
[im 14/40]
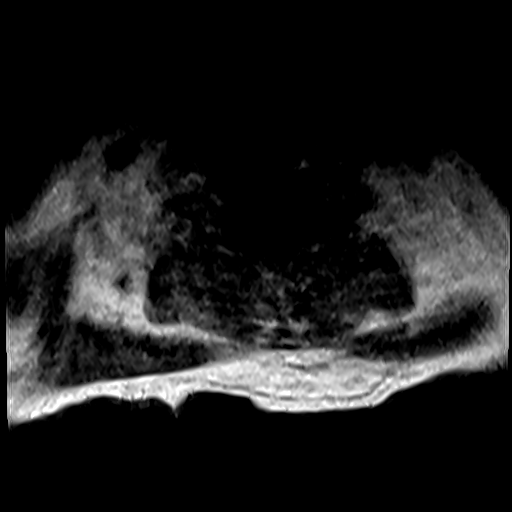
[im 27/40]
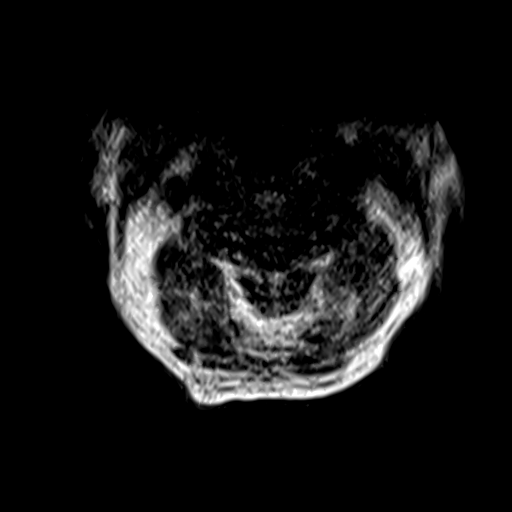
[im 40/40]
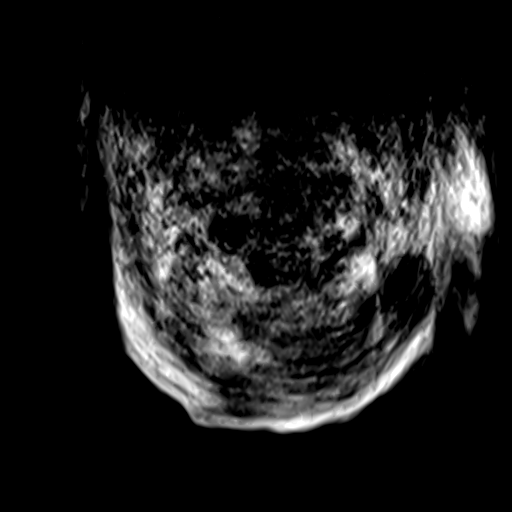

[Series 15: t1_tse_warp_tra · axial · 3.0mm · 0.39mm/px · z∈[-139,-20]mm · 4 of 40 slices shown (2 of 2)]
[im 1/40]
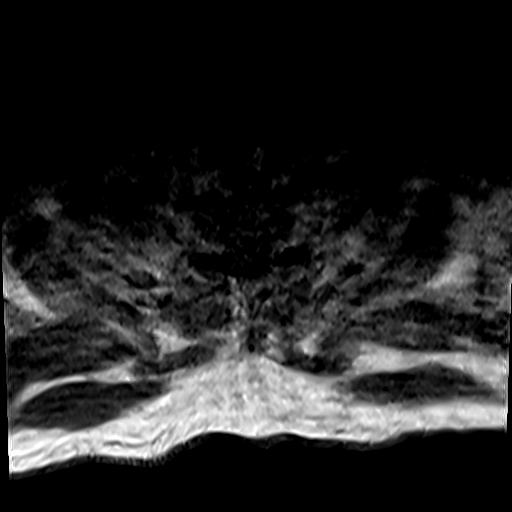
[im 14/40]
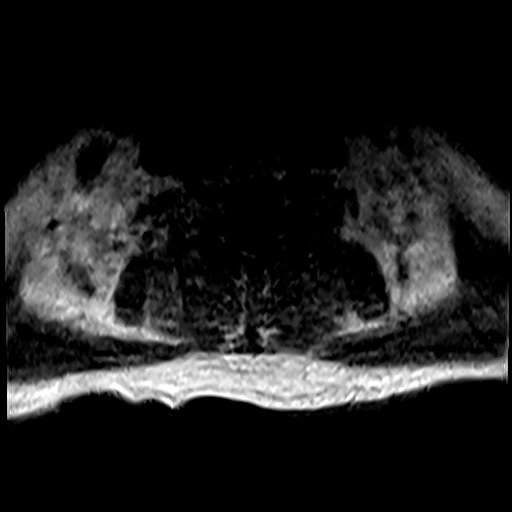
[im 27/40]
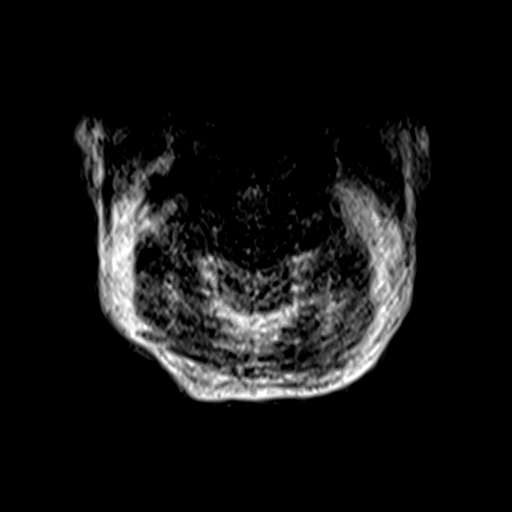
[im 40/40]
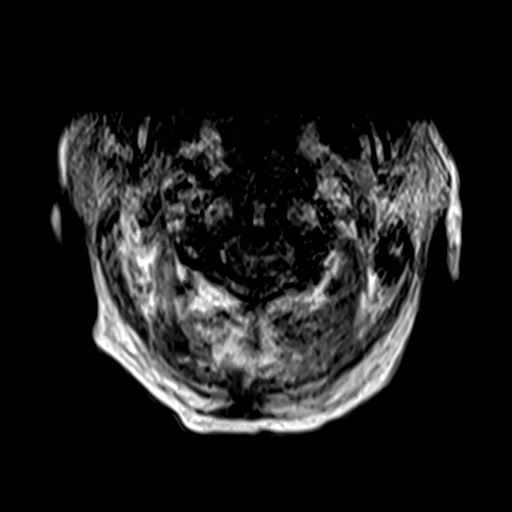

[Series 16: post t1_tse_warp_sag · sagittal · 3.0mm · 0.45mm/px · 1 of 15 slices shown]
[im 1/15]
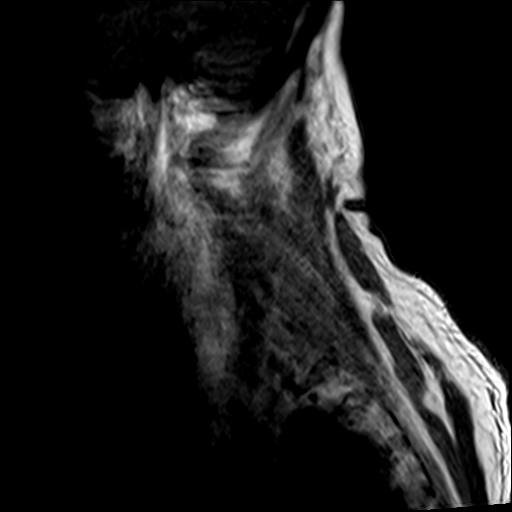

[Series 17: post t1_tse_warp_tra · axial · 3.0mm · 0.39mm/px · z∈[-139,-60]mm · 3 of 40 slices shown]
[im 1/40]
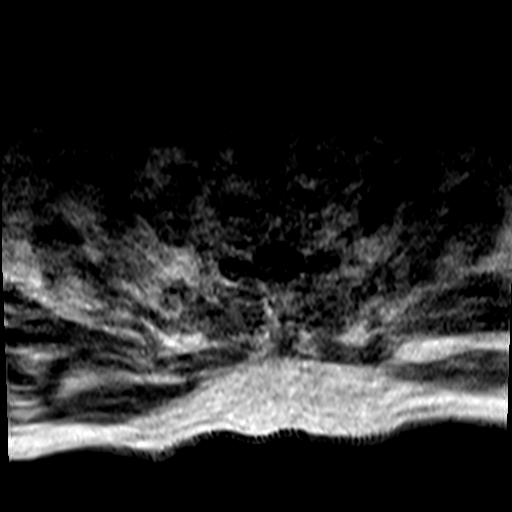
[im 14/40]
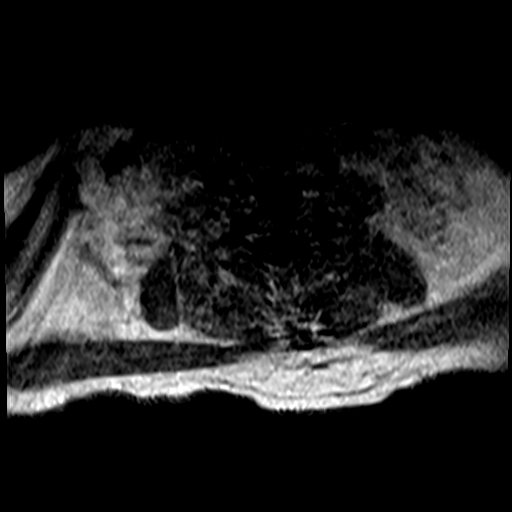
[im 27/40]
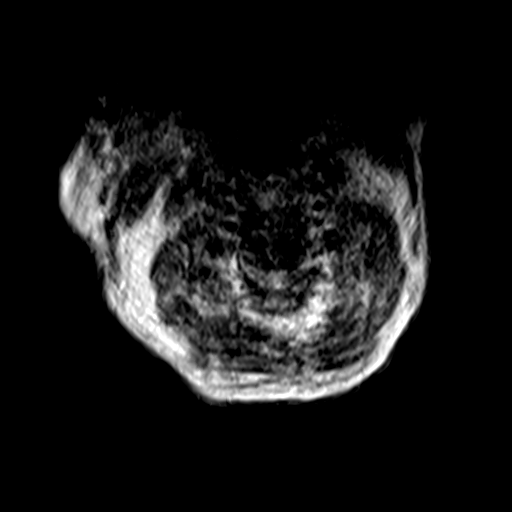

[23 of 48 positions shown; findings below may reference images not displayed]

FINDINGS: Study is moderately and occasionally severely degraded by motion
artifact despite repeated imaging attempts.

Alignment: Stable straightening of cervical lordosis.

Vertebrae: Hardware susceptibility artifact related to prior C4-C5
and C5-C6 ACDF. No definite marrow edema or acute osseous
abnormality.

Cord: Poor definition of the spinal cord due to motion on all
sequences. No obvious cord expansion or edema.

Posterior Fossa, vertebral arteries, paraspinal tissues:
Cervicomedullary junction is within normal limits. Negative visible
posterior fossa.

Poor definition of neck soft tissues due to motion artifact.

Disc levels:

Very limited detail of disc levels related to motion. Sagittal T2
image (series 9) provides the best disc level detail:

C2-C3:  No spinal stenosis.  No definite foraminal stenosis.

C3-C4:  No significant spinal stenosis.

C4-C5:  Prior ACDF.  Suggestion of mild residual spinal stenosis.

C5-C6:  Prior ACDF.  Suggestion of mild residual spinal stenosis.

C6-C7:  No significant spinal stenosis.

C7-T1:  No spinal or foraminal stenosis.
IMPRESSION: 1. Unfortunately this exam is substantially degraded by motion on
all sequences despite anxiolysis medication and repeated imaging
attempts. A repeat MRI with sedation likely would be necessary to
achieve a study with typical diagnostic quality.

2. Poor detail of the cervical spinal cord and limited detail of
disc spaces, but no significant cervical spinal stenosis suspected
outside of the prior ACDF levels.

3. Suggestion of some degree of residual spinal stenosis at C4-C5
and C5-C6.

## 2022-01-30 IMAGING — CR DG CHEST 1V PORT
1 series · 1 of 1 positions shown · non-contrast
Comparison: 01/15/2021

CLINICAL DATA: Weakness, fell

EXAM:
PORTABLE CHEST 1 VIEW

[x chest ap]
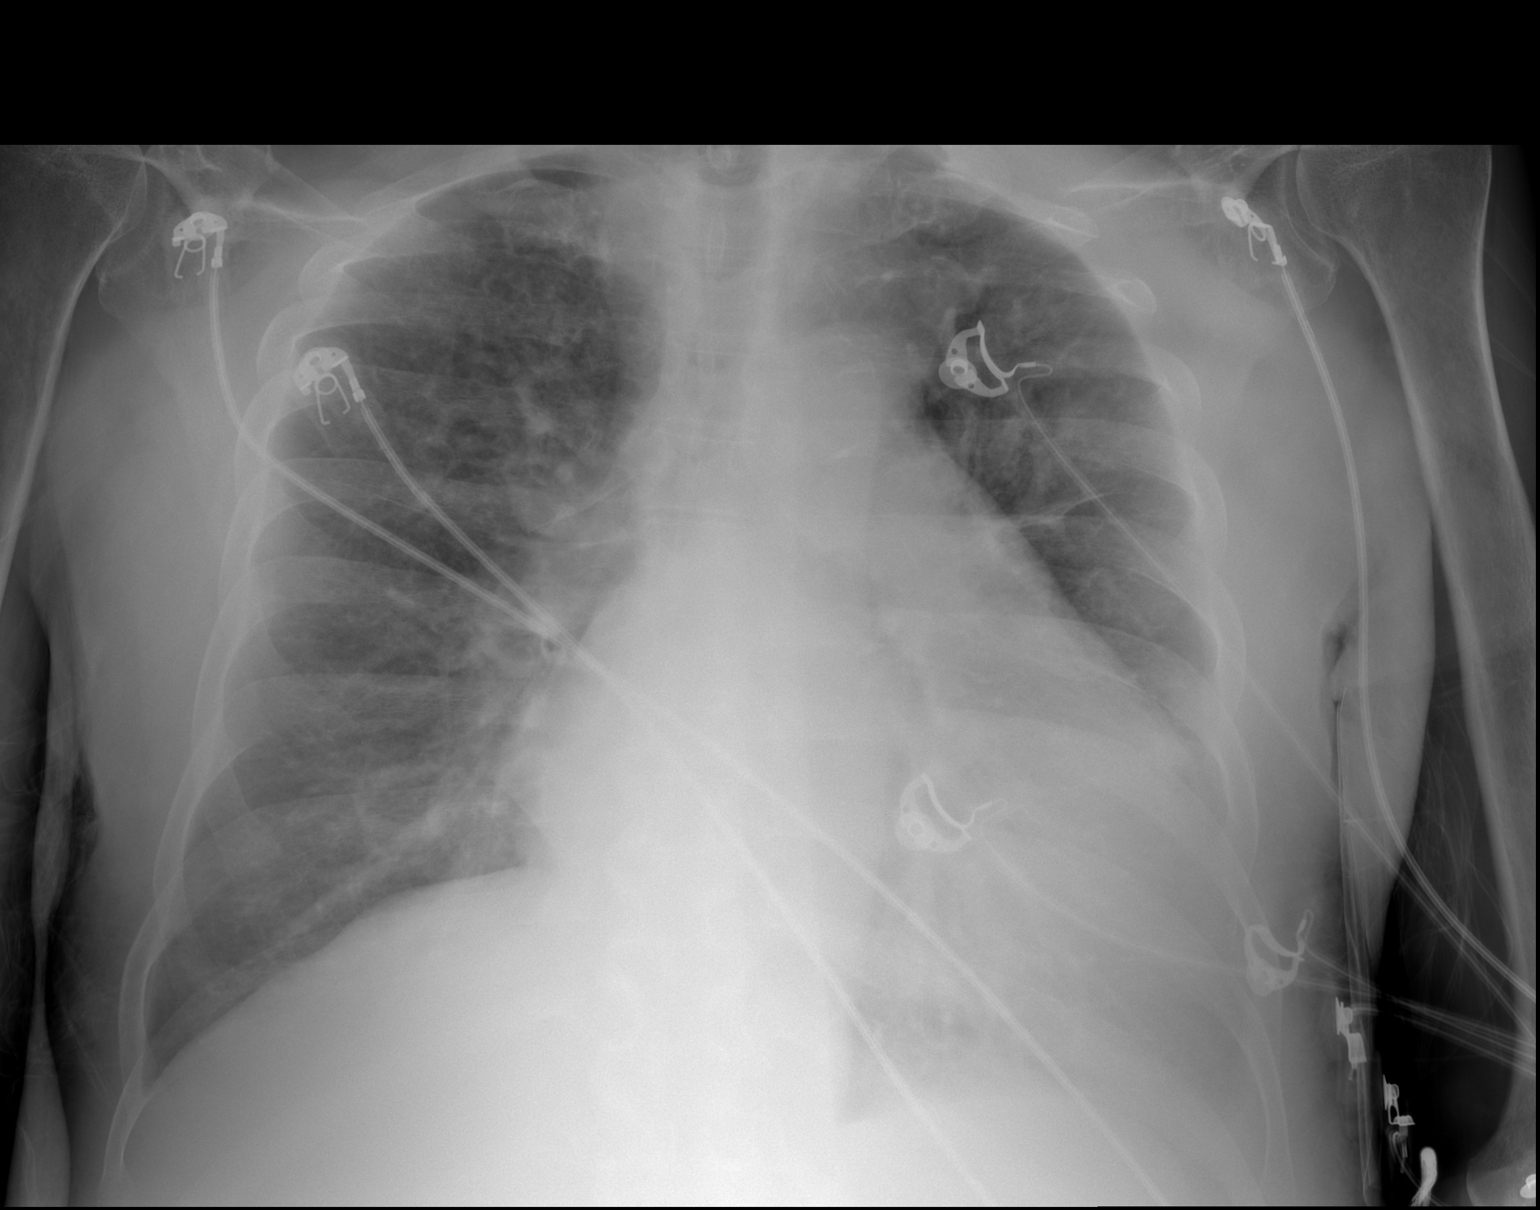

[1 of 1 positions shown; findings below may reference images not displayed]

FINDINGS: Single frontal view of the chest demonstrates stable enlargement of
the cardiac silhouette. There is central vascular congestion, with
mild perihilar ground-glass airspace disease consistent with mild
congestive heart failure. No effusion or pneumothorax. No acute
fracture.
IMPRESSION: 1. Mild congestive heart failure.

## 2022-01-30 IMAGING — CT CT CERVICAL SPINE W/O CM
3 of 4 series · 12 of 33 positions shown, 14 images · non-contrast
Comparison: 11/26/2020

CLINICAL DATA: Status post fall. Laceration above the eye. Five by
his roommate.

EXAM:
CT HEAD WITHOUT CONTRAST
CT MAXILLOFACIAL WITHOUT CONTRAST
CT CERVICAL SPINE WITHOUT CONTRAST
TECHNIQUE: Multidetector CT imaging of the head, cervical spine, and
maxillofacial structures were performed using the standard protocol
without intravenous contrast. Multiplanar CT image reconstructions
of the cervical spine and maxillofacial structures were also
generated.

[Series 6: orthogonal axials · axial · 0.23mm/px · z∈[+1421,+1560]mm · 4 of 107 slices shown, 5 images]
[im 18/107  soft-tissue]
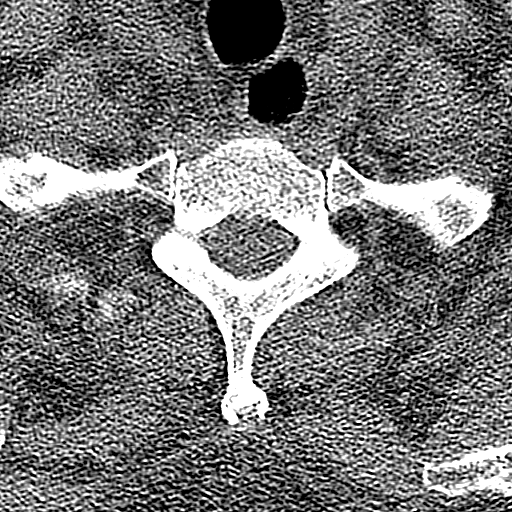
[im 18/107  bone]
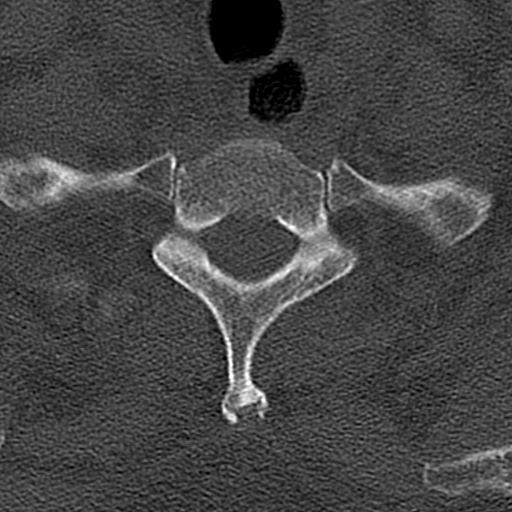
[im 36/107  bone]
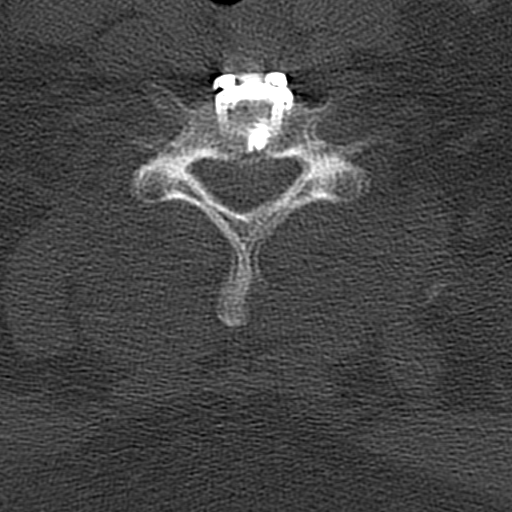
[im 71/107  bone]
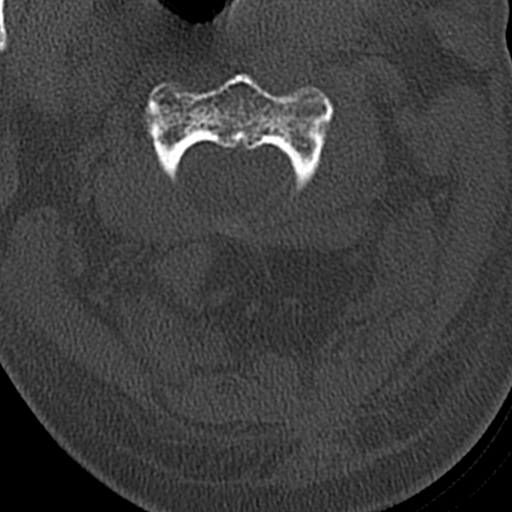
[im 89/107  bone]
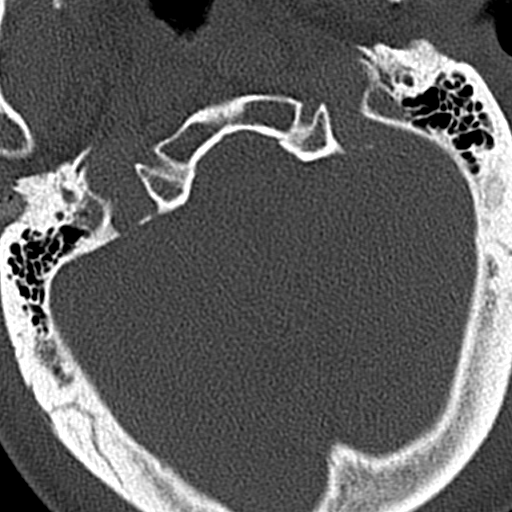

[Series 7: coronal bone · coronal · 0.28mm/px · 3 of 61 slices shown]
[im 13/61  bone]
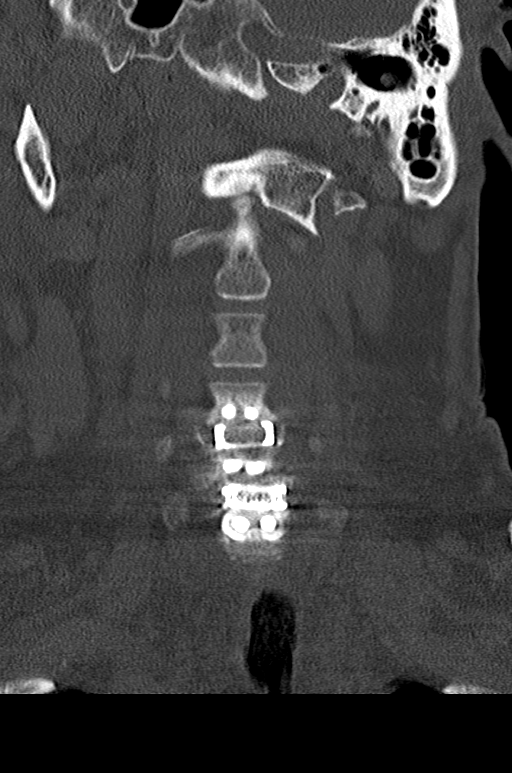
[im 25/61  bone]
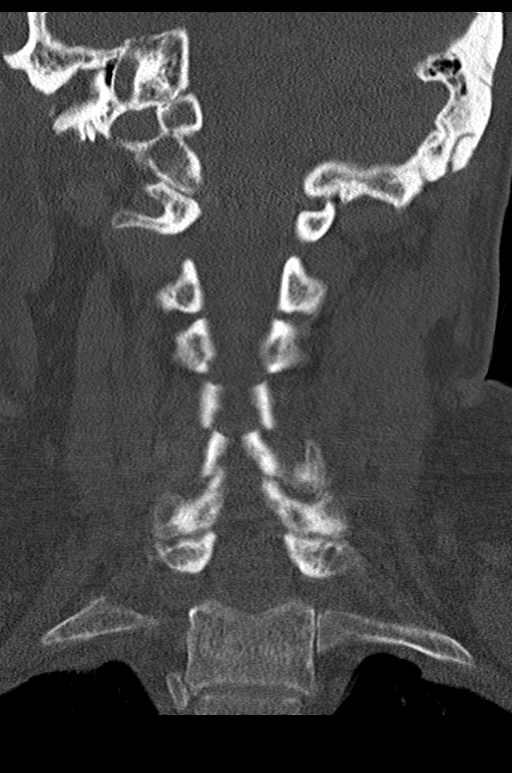
[im 37/61  bone]
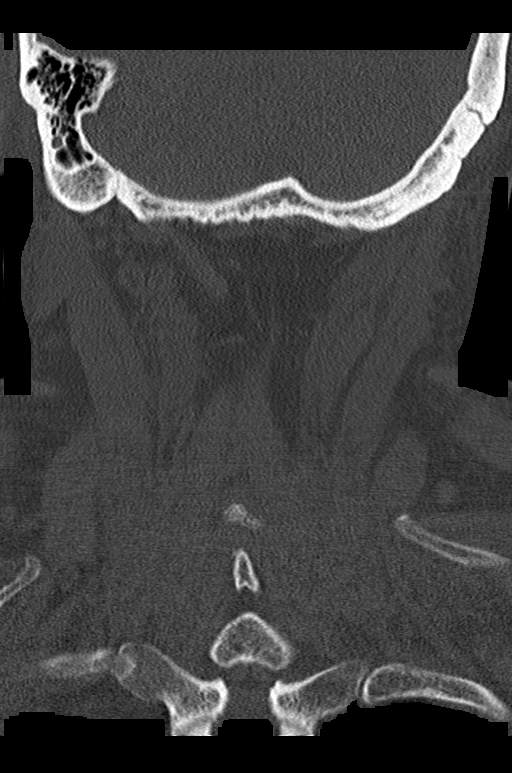

[Series 8: sagittal bone · sagittal · 0.28mm/px · 5 of 61 slices shown, 6 images]
[im 21/61  bone]
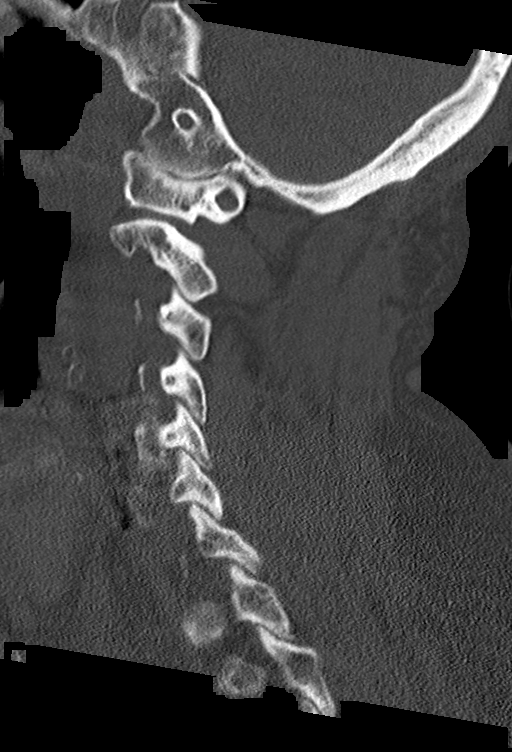
[im 26/61  bone]
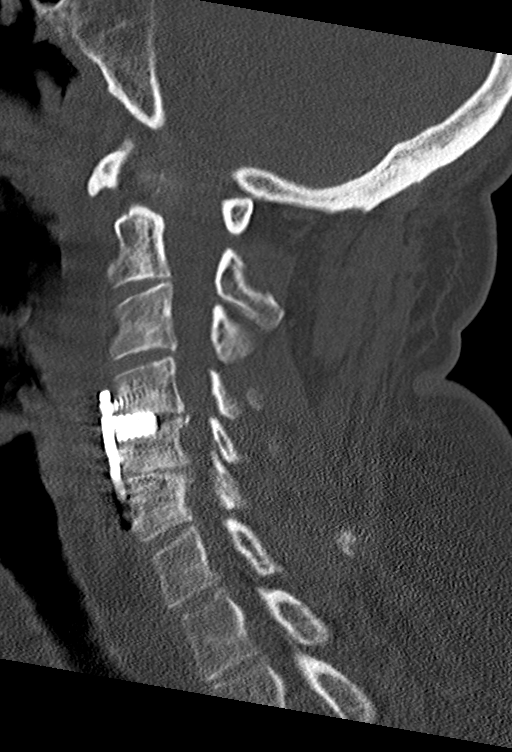
[im 31/61  soft-tissue]
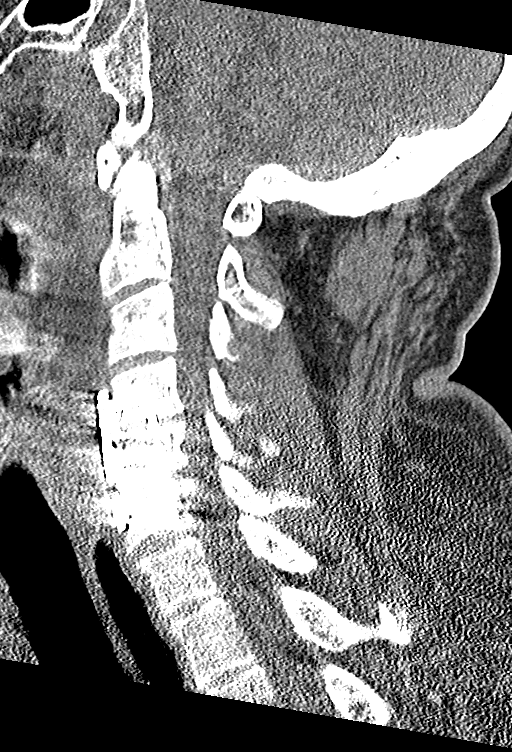
[im 31/61  bone]
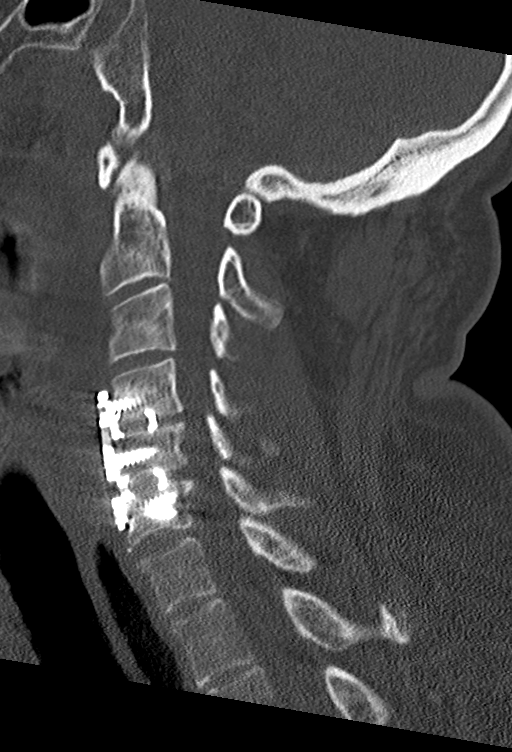
[im 36/61  bone]
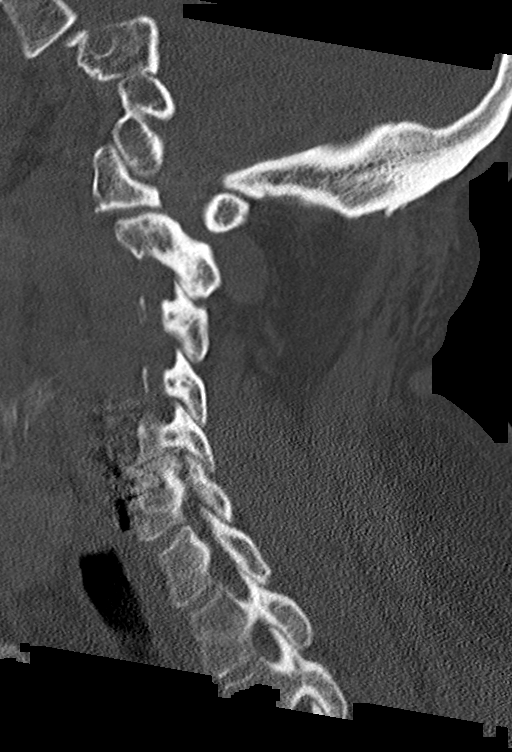
[im 41/61  bone]
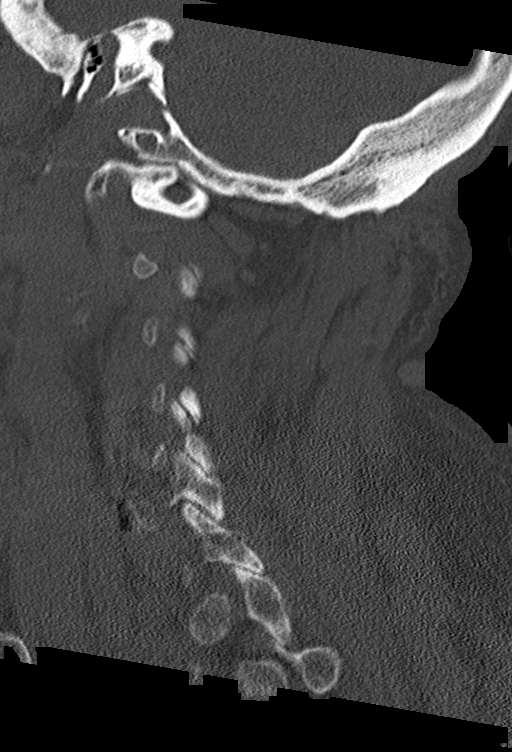

[12 of 33 positions shown; findings below may reference images not displayed]

FINDINGS: CT HEAD FINDINGS

Brain: No evidence of acute infarction, hemorrhage, extra-axial
collection, ventriculomegaly, or mass effect. Generalized cerebral
atrophy. Periventricular white matter low attenuation likely
secondary to microangiopathy.

Vascular: Cerebrovascular atherosclerotic calcifications are noted.

Skull: Negative for fracture or focal lesion.

Sinuses/Orbits: Visualized portions of the orbits are unremarkable.
Visualized portions of the paranasal sinuses are unremarkable.
Visualized portions of the mastoid air cells are unremarkable.

Other: None.

CT MAXILLOFACIAL FINDINGS

Osseous: No fracture or mandibular dislocation. No destructive
process. Osteoarthritis of bilateral temporomandibular joints.

Orbits: Negative. No traumatic or inflammatory finding.

Sinuses: Mild mucosal thickening of bilateral maxillary sinuses.
Periapical lucency around the left upper central incisor and right
upper premolar as can be seen with dental disease.

Soft tissues: Severe soft tissue swelling around the right orbit and
cheek.

CT CERVICAL SPINE FINDINGS

Alignment: Normal.

Skull base and vertebrae: No acute fracture. No primary bone lesion
or focal pathologic process.

Soft tissues and spinal canal: No prevertebral fluid or swelling. No
visible canal hematoma.

Disc levels: Anterior cervical fusion from C4 through C6 with plate
and screw fixation. Broad osseous ridging at C4-5 and C5-6 eccentric
towards the right with bilateral right foraminal narrowing, most
severe at C5-6.

Upper chest: Lung apices are clear.

Other: Bilateral carotid artery atherosclerosis.
IMPRESSION: 1. No acute intracranial pathology.
2. No acute osseous injury of the maxillofacial bones.
3. No acute osseous injury of the cervical spine.
4. Anterior cervical fusion from C4 through C6 with plate and screw
fixation. Broad osseous ridging at C4-5 and C5-6 eccentric towards
the right with bilateral right foraminal narrowing, most severe at
C5-6.

## 2022-01-30 IMAGING — CR DG LUMBAR SPINE 2-3V
3 series · 3 of 3 positions shown · non-contrast
Comparison: 10/02/2017, Marlon Hernando [id number]

CLINICAL DATA: Pain after fall

EXAM:
LUMBAR SPINE - 2-3 VIEW

[t lumbar spine ap]
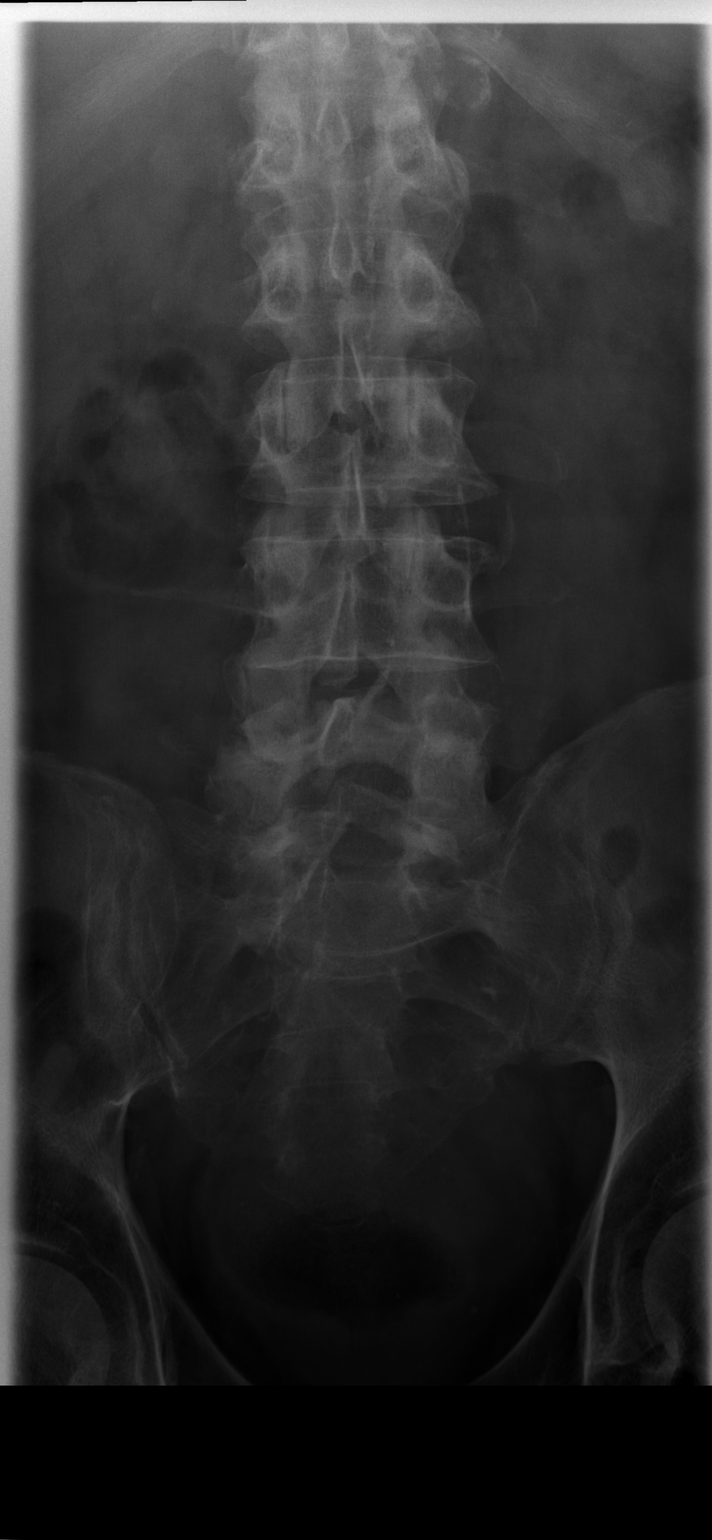

[t lumbar spine lat]
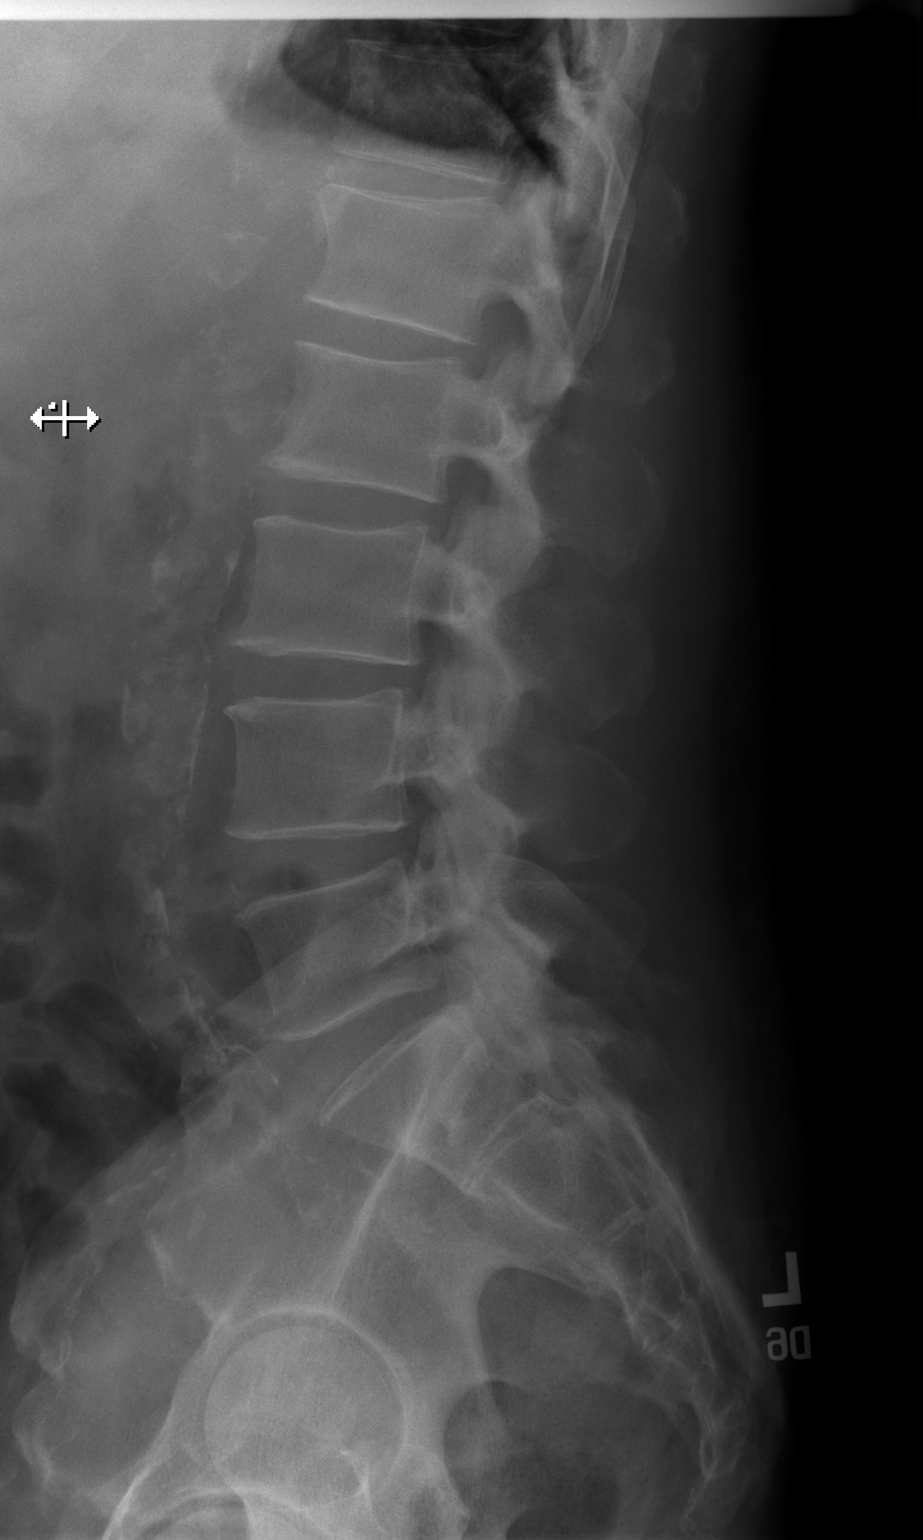

[t lumbar l-5 s-1 spot]
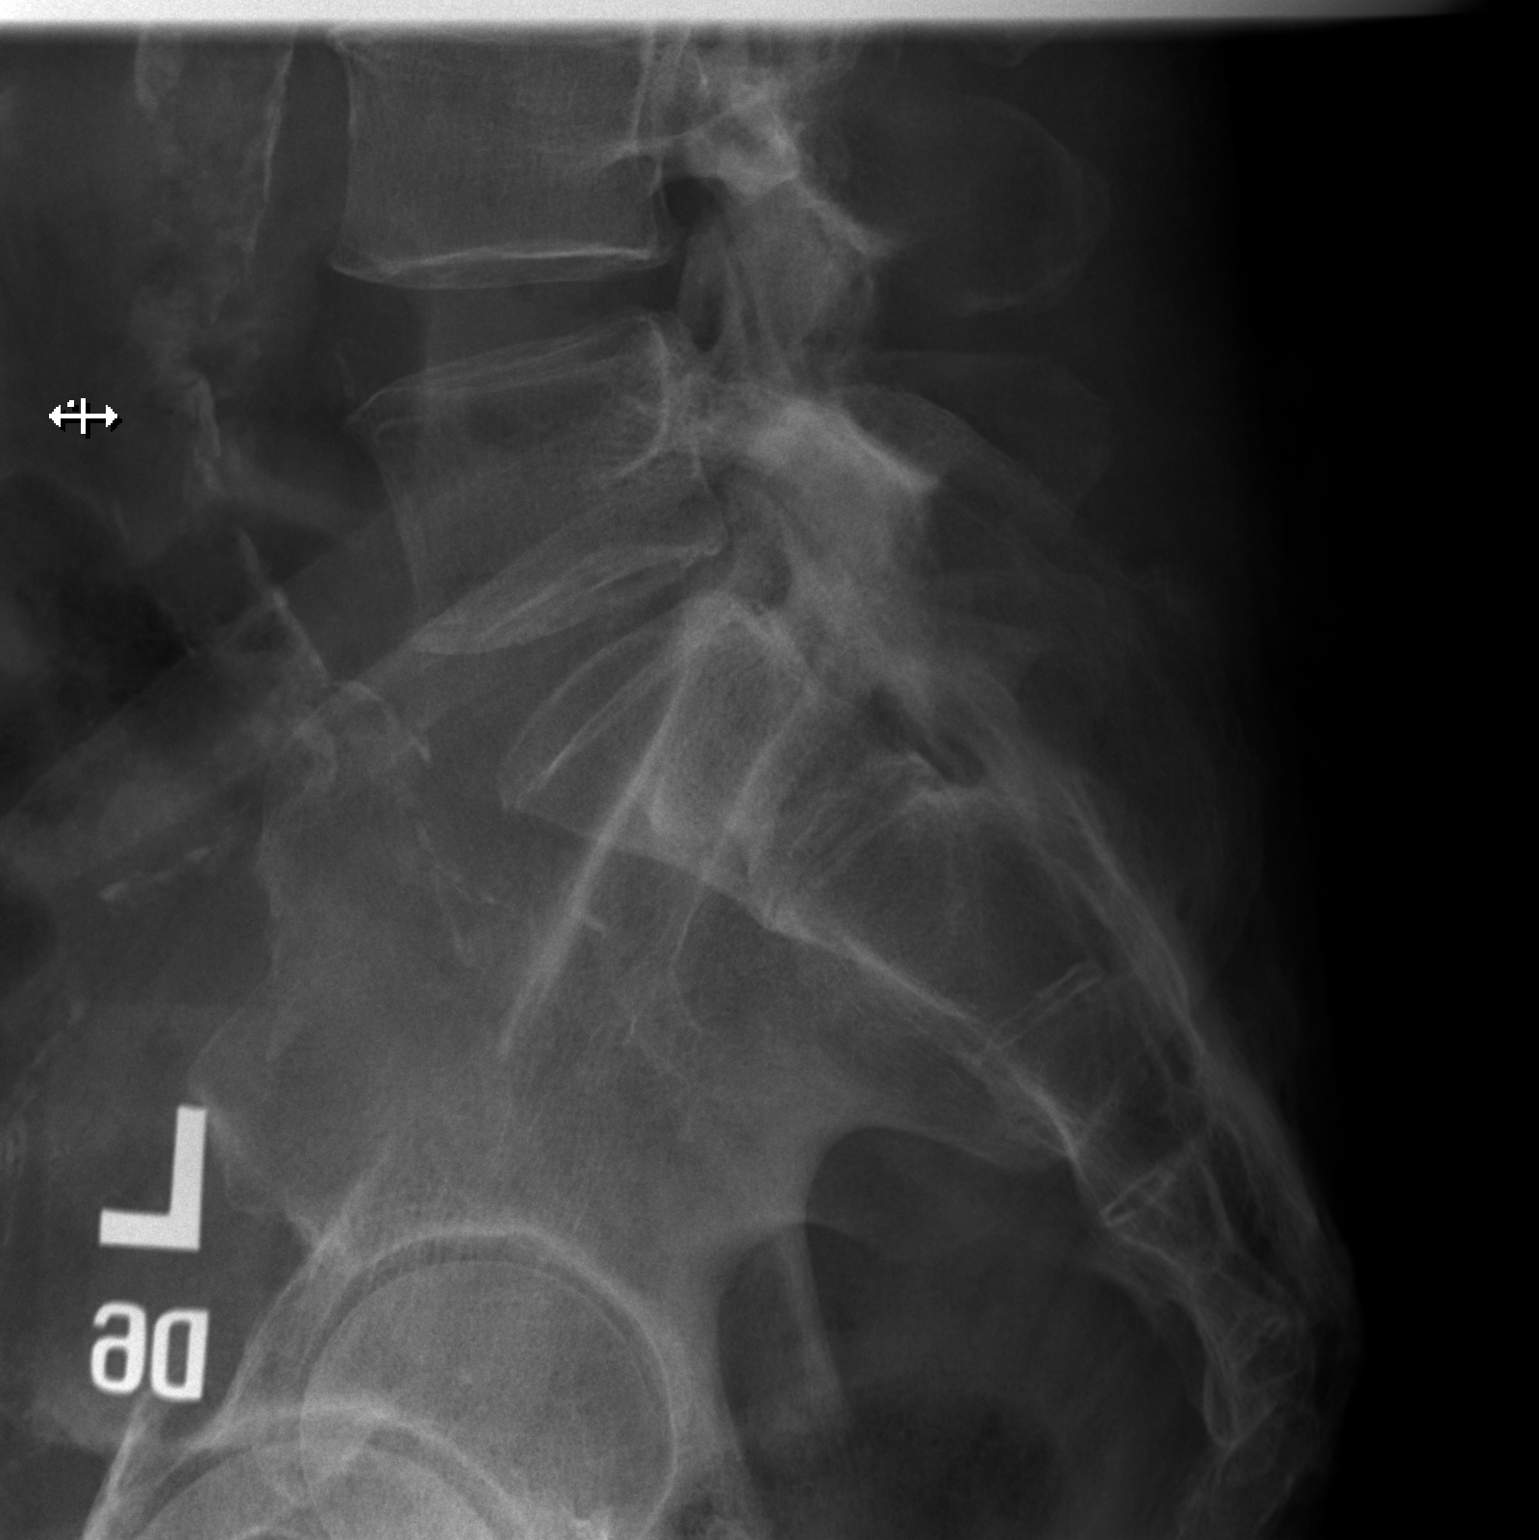

[3 of 3 positions shown; findings below may reference images not displayed]

FINDINGS: Frontal and lateral views of the lumbar spine demonstrate 5
non-rib-bearing lumbar type vertebral bodies in stable anatomic
alignment. No acute displaced fracture. Disc spaces are well
preserved. Stable mild facet hypertrophy at the lumbosacral
junction. Diffuse atherosclerosis unchanged.
IMPRESSION: 1. No acute lumbar spine fracture.

## 2022-01-30 IMAGING — CT CT MAXILLOFACIAL W/O CM
3 series · 14 of 47 positions shown, 16 images · non-contrast
Comparison: 11/26/2020

CLINICAL DATA: Status post fall. Laceration above the eye. Five by
his roommate.

EXAM:
CT HEAD WITHOUT CONTRAST
CT MAXILLOFACIAL WITHOUT CONTRAST
CT CERVICAL SPINE WITHOUT CONTRAST
TECHNIQUE: Multidetector CT imaging of the head, cervical spine, and
maxillofacial structures were performed using the standard protocol
without intravenous contrast. Multiplanar CT image reconstructions
of the cervical spine and maxillofacial structures were also
generated.

[Series 4: max soft · axial · 0.44mm/px · z∈[+1494,+1662]mm · 8 of 98 slices shown, 10 images]
[im 7/98  brain]
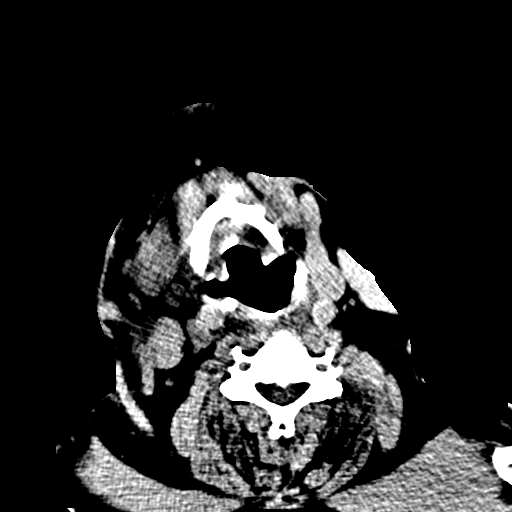
[im 7/98  bone]
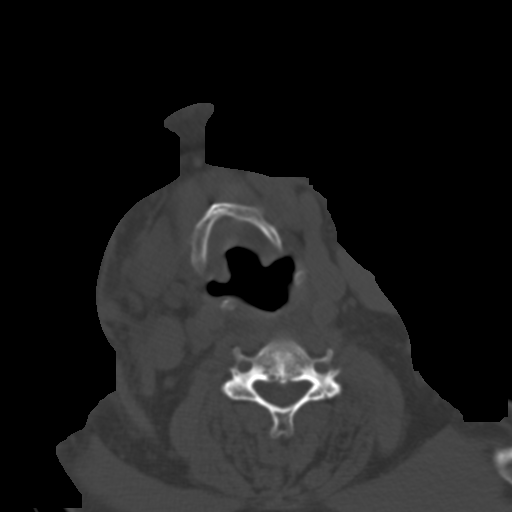
[im 21/98  bone]
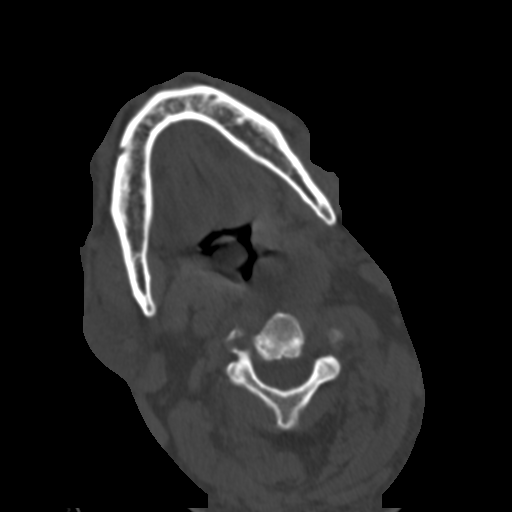
[im 31/98  bone]
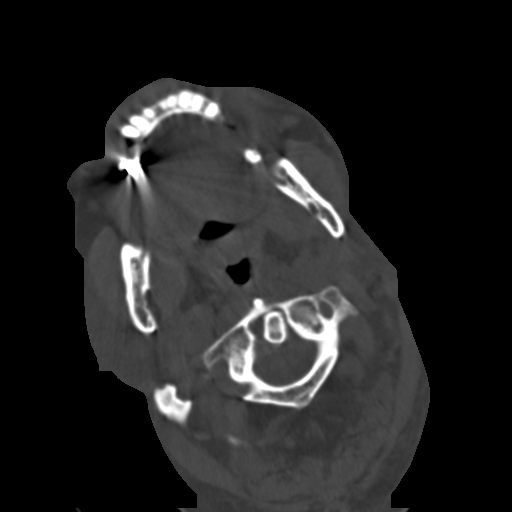
[im 44/98  bone]
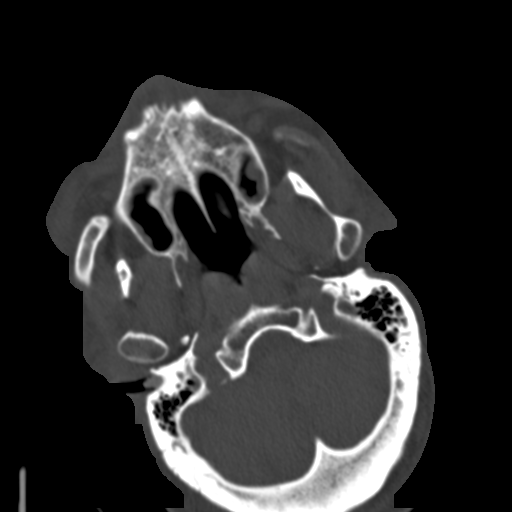
[im 54/98  brain]
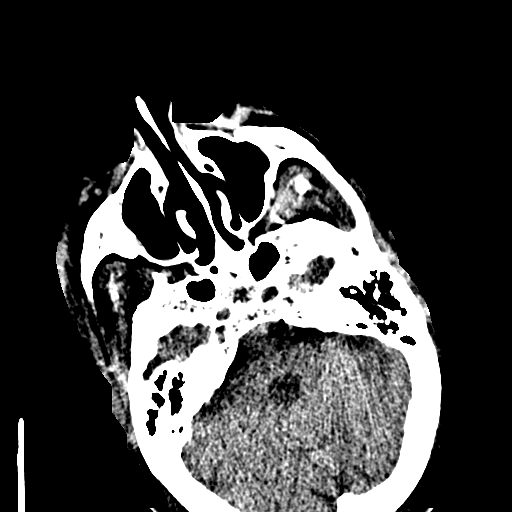
[im 54/98  bone]
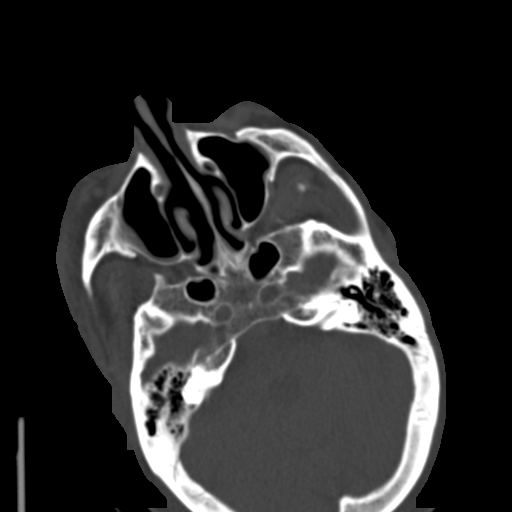
[im 67/98  bone]
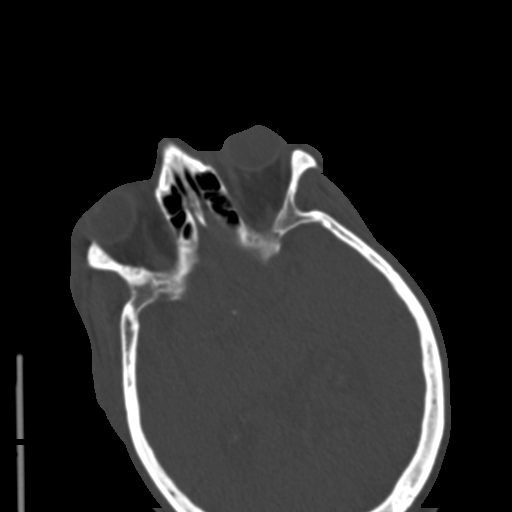
[im 77/98  bone]
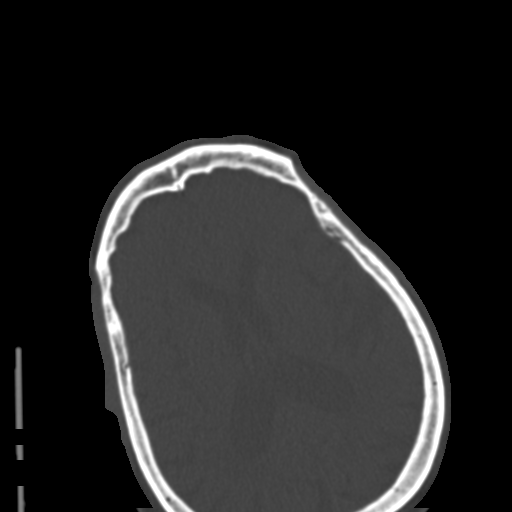
[im 91/98  bone]
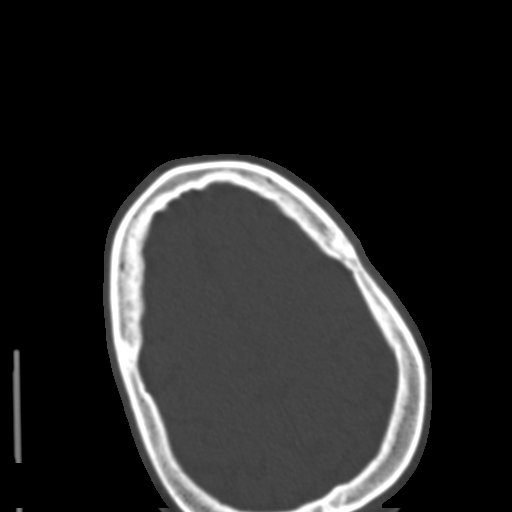

[Series 8: coronal soft · coronal · 0.39mm/px · 3 of 101 slices shown]
[im 34/101  bone]
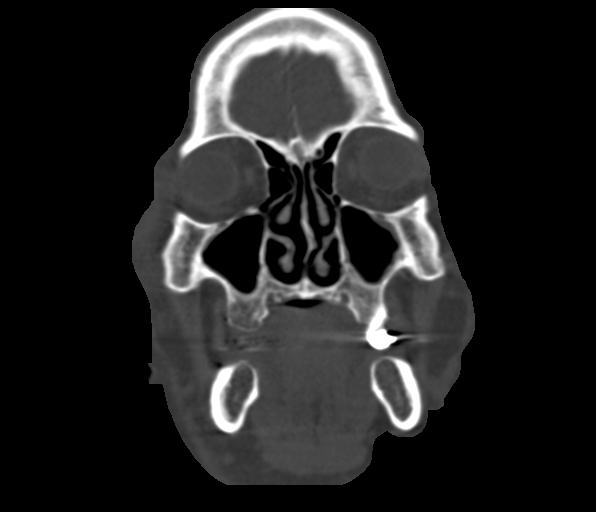
[im 45/101  bone]
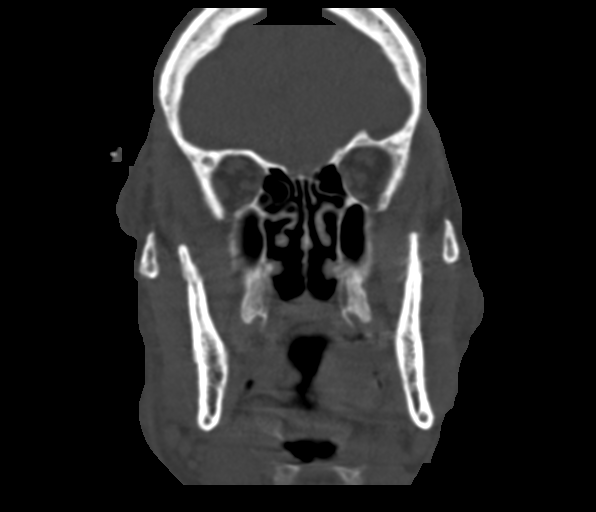
[im 56/101  bone]
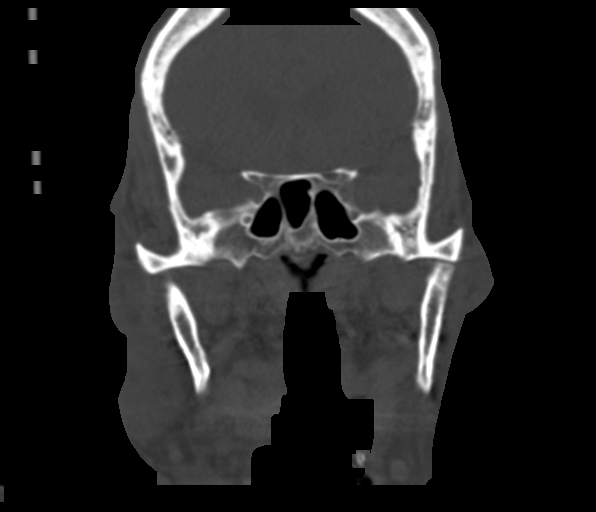

[Series 9: sagittal soft · sagittal · 0.46mm/px · 3 of 84 slices shown]
[im 28/84  bone]
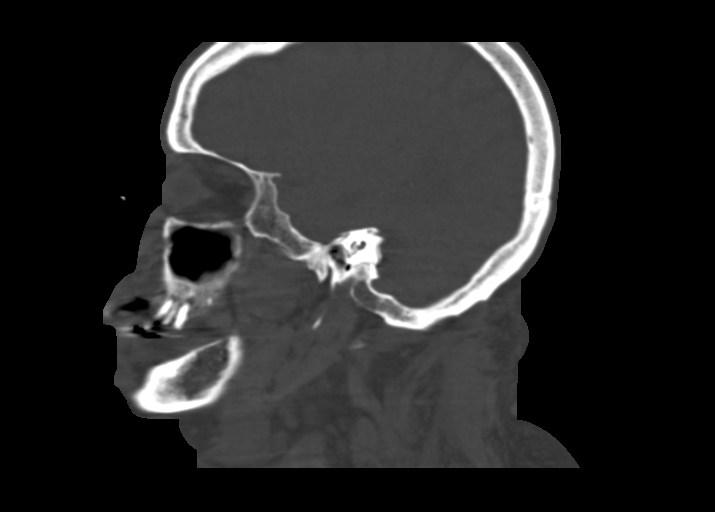
[im 42/84  bone]
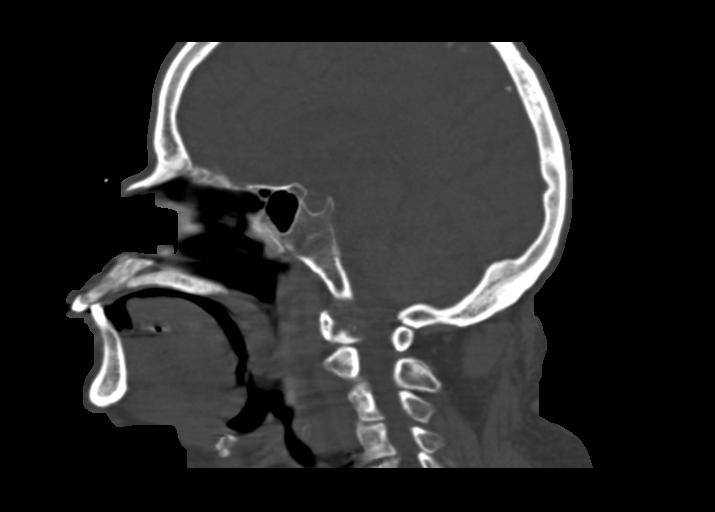
[im 56/84  bone]
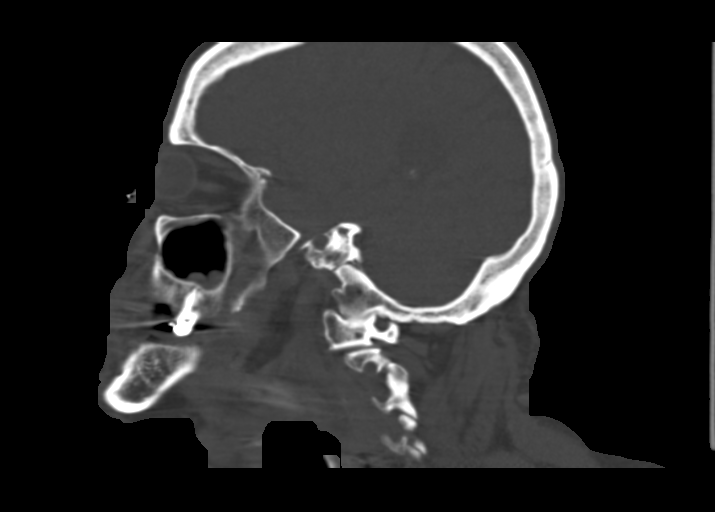

[14 of 47 positions shown; findings below may reference images not displayed]

FINDINGS: CT HEAD FINDINGS

Brain: No evidence of acute infarction, hemorrhage, extra-axial
collection, ventriculomegaly, or mass effect. Generalized cerebral
atrophy. Periventricular white matter low attenuation likely
secondary to microangiopathy.

Vascular: Cerebrovascular atherosclerotic calcifications are noted.

Skull: Negative for fracture or focal lesion.

Sinuses/Orbits: Visualized portions of the orbits are unremarkable.
Visualized portions of the paranasal sinuses are unremarkable.
Visualized portions of the mastoid air cells are unremarkable.

Other: None.

CT MAXILLOFACIAL FINDINGS

Osseous: No fracture or mandibular dislocation. No destructive
process. Osteoarthritis of bilateral temporomandibular joints.

Orbits: Negative. No traumatic or inflammatory finding.

Sinuses: Mild mucosal thickening of bilateral maxillary sinuses.
Periapical lucency around the left upper central incisor and right
upper premolar as can be seen with dental disease.

Soft tissues: Severe soft tissue swelling around the right orbit and
cheek.

CT CERVICAL SPINE FINDINGS

Alignment: Normal.

Skull base and vertebrae: No acute fracture. No primary bone lesion
or focal pathologic process.

Soft tissues and spinal canal: No prevertebral fluid or swelling. No
visible canal hematoma.

Disc levels: Anterior cervical fusion from C4 through C6 with plate
and screw fixation. Broad osseous ridging at C4-5 and C5-6 eccentric
towards the right with bilateral right foraminal narrowing, most
severe at C5-6.

Upper chest: Lung apices are clear.

Other: Bilateral carotid artery atherosclerosis.
IMPRESSION: 1. No acute intracranial pathology.
2. No acute osseous injury of the maxillofacial bones.
3. No acute osseous injury of the cervical spine.
4. Anterior cervical fusion from C4 through C6 with plate and screw
fixation. Broad osseous ridging at C4-5 and C5-6 eccentric towards
the right with bilateral right foraminal narrowing, most severe at
C5-6.
